# Patient Record
Sex: Female | Born: 1955
Health system: Southern US, Community
[De-identification: ages and names within clinical notes are randomized; demographics above are authoritative.]

## PROBLEM LIST (undated history)

## (undated) DIAGNOSIS — J302 Other seasonal allergic rhinitis: Secondary | ICD-10-CM

## (undated) DIAGNOSIS — G8929 Other chronic pain: Secondary | ICD-10-CM

## (undated) DIAGNOSIS — L589 Radiodermatitis, unspecified: Secondary | ICD-10-CM

## (undated) DIAGNOSIS — K56609 Unspecified intestinal obstruction, unspecified as to partial versus complete obstruction: Secondary | ICD-10-CM

## (undated) DIAGNOSIS — M6281 Muscle weakness (generalized): Secondary | ICD-10-CM

## (undated) DIAGNOSIS — R112 Nausea with vomiting, unspecified: Secondary | ICD-10-CM

## (undated) DIAGNOSIS — M2550 Pain in unspecified joint: Secondary | ICD-10-CM

## (undated) DIAGNOSIS — E559 Vitamin D deficiency, unspecified: Secondary | ICD-10-CM

## (undated) DIAGNOSIS — J3089 Other allergic rhinitis: Secondary | ICD-10-CM

## (undated) DIAGNOSIS — N9089 Other specified noninflammatory disorders of vulva and perineum: Secondary | ICD-10-CM

## (undated) DIAGNOSIS — K589 Irritable bowel syndrome without diarrhea: Secondary | ICD-10-CM

## (undated) DIAGNOSIS — I10 Essential (primary) hypertension: Secondary | ICD-10-CM

## (undated) DIAGNOSIS — N2889 Other specified disorders of kidney and ureter: Secondary | ICD-10-CM

## (undated) DIAGNOSIS — K449 Diaphragmatic hernia without obstruction or gangrene: Secondary | ICD-10-CM

## (undated) DIAGNOSIS — K760 Fatty (change of) liver, not elsewhere classified: Secondary | ICD-10-CM

## (undated) DIAGNOSIS — C549 Malignant neoplasm of corpus uteri, unspecified: Secondary | ICD-10-CM

## (undated) DIAGNOSIS — Z7901 Long term (current) use of anticoagulants: Secondary | ICD-10-CM

## (undated) DIAGNOSIS — N183 Chronic kidney disease, stage 3 unspecified: Secondary | ICD-10-CM

## (undated) DIAGNOSIS — N321 Vesicointestinal fistula: Secondary | ICD-10-CM

## (undated) DIAGNOSIS — D3502 Benign neoplasm of left adrenal gland: Secondary | ICD-10-CM

## (undated) DIAGNOSIS — R002 Palpitations: Secondary | ICD-10-CM

## (undated) DIAGNOSIS — N739 Female pelvic inflammatory disease, unspecified: Secondary | ICD-10-CM

## (undated) DIAGNOSIS — K219 Gastro-esophageal reflux disease without esophagitis: Secondary | ICD-10-CM

## (undated) DIAGNOSIS — R600 Localized edema: Secondary | ICD-10-CM

## (undated) DIAGNOSIS — R300 Dysuria: Secondary | ICD-10-CM

## (undated) DIAGNOSIS — Z9889 Other specified postprocedural states: Secondary | ICD-10-CM

## (undated) DIAGNOSIS — M549 Dorsalgia, unspecified: Secondary | ICD-10-CM

## (undated) DIAGNOSIS — R399 Unspecified symptoms and signs involving the genitourinary system: Secondary | ICD-10-CM

## (undated) DIAGNOSIS — E039 Hypothyroidism, unspecified: Secondary | ICD-10-CM

## (undated) DIAGNOSIS — Z932 Ileostomy status: Secondary | ICD-10-CM

## (undated) DIAGNOSIS — N135 Crossing vessel and stricture of ureter without hydronephrosis: Secondary | ICD-10-CM

## (undated) DIAGNOSIS — D709 Neutropenia, unspecified: Secondary | ICD-10-CM

## (undated) DIAGNOSIS — E113299 Type 2 diabetes mellitus with mild nonproliferative diabetic retinopathy without macular edema, unspecified eye: Secondary | ICD-10-CM

## (undated) DIAGNOSIS — I499 Cardiac arrhythmia, unspecified: Secondary | ICD-10-CM

## (undated) DIAGNOSIS — N182 Chronic kidney disease, stage 2 (mild): Secondary | ICD-10-CM

## (undated) DIAGNOSIS — Z8719 Personal history of other diseases of the digestive system: Secondary | ICD-10-CM

## (undated) DIAGNOSIS — E782 Mixed hyperlipidemia: Secondary | ICD-10-CM

## (undated) DIAGNOSIS — C50912 Malignant neoplasm of unspecified site of left female breast: Secondary | ICD-10-CM

## (undated) DIAGNOSIS — E119 Type 2 diabetes mellitus without complications: Secondary | ICD-10-CM

## (undated) DIAGNOSIS — I480 Paroxysmal atrial fibrillation: Secondary | ICD-10-CM

## (undated) DIAGNOSIS — Z923 Personal history of irradiation: Secondary | ICD-10-CM

## (undated) DIAGNOSIS — Z87898 Personal history of other specified conditions: Secondary | ICD-10-CM

## (undated) DIAGNOSIS — D630 Anemia in neoplastic disease: Secondary | ICD-10-CM

## (undated) DIAGNOSIS — D631 Anemia in chronic kidney disease: Secondary | ICD-10-CM

## (undated) DIAGNOSIS — G629 Polyneuropathy, unspecified: Secondary | ICD-10-CM

## (undated) DIAGNOSIS — Z789 Other specified health status: Secondary | ICD-10-CM

## (undated) DIAGNOSIS — R110 Nausea: Secondary | ICD-10-CM

## (undated) DIAGNOSIS — N189 Chronic kidney disease, unspecified: Secondary | ICD-10-CM

## (undated) DIAGNOSIS — E042 Nontoxic multinodular goiter: Secondary | ICD-10-CM

## (undated) DIAGNOSIS — Z973 Presence of spectacles and contact lenses: Secondary | ICD-10-CM

## (undated) DIAGNOSIS — Z936 Other artificial openings of urinary tract status: Secondary | ICD-10-CM

## (undated) HISTORY — PX: TOTAL ABDOMINAL HYSTERECTOMY: SHX209

## (undated) HISTORY — DX: Gastro-esophageal reflux disease without esophagitis: K21.9

## (undated) HISTORY — DX: Dorsalgia, unspecified: M54.9

## (undated) HISTORY — DX: Pain in unspecified joint: M25.50

## (undated) HISTORY — DX: Anemia in neoplastic disease: D63.0

## (undated) HISTORY — DX: Vesicointestinal fistula: N32.1

## (undated) HISTORY — DX: Other artificial openings of urinary tract status: Z93.6

## (undated) HISTORY — DX: Essential (primary) hypertension: I10

## (undated) HISTORY — DX: Type 2 diabetes mellitus without complications: E11.9

## (undated) HISTORY — DX: Diaphragmatic hernia without obstruction or gangrene: K44.9

## (undated) HISTORY — PX: EYE SURGERY: SHX253

## (undated) HISTORY — DX: Irritable bowel syndrome without diarrhea: K58.9

## (undated) HISTORY — DX: Palpitations: R00.2

## (undated) HISTORY — DX: Localized edema: R60.0

## (undated) HISTORY — DX: Hypothyroidism, unspecified: E03.9

## (undated) HISTORY — PX: TONSILLECTOMY: SUR1361

## (undated) HISTORY — DX: Nontoxic multinodular goiter: E04.2

## (undated) HISTORY — PX: OTHER SURGICAL HISTORY: SHX169

## (undated) HISTORY — PX: APPENDECTOMY: SHX54

---
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
## 1988-08-02 HISTORY — PX: LAPAROSCOPIC CHOLECYSTECTOMY: SUR755

## 1998-07-29 ENCOUNTER — Ambulatory Visit (HOSPITAL_COMMUNITY): Admission: RE | Admit: 1998-07-29 | Discharge: 1998-07-29 | Payer: Self-pay | Admitting: Obstetrics and Gynecology

## 1998-07-29 ENCOUNTER — Encounter: Payer: Self-pay | Admitting: Obstetrics and Gynecology

## 1999-07-31 ENCOUNTER — Ambulatory Visit (HOSPITAL_COMMUNITY): Admission: RE | Admit: 1999-07-31 | Discharge: 1999-07-31 | Payer: Self-pay | Admitting: Obstetrics and Gynecology

## 1999-07-31 ENCOUNTER — Encounter: Payer: Self-pay | Admitting: Obstetrics and Gynecology

## 1999-08-06 ENCOUNTER — Encounter: Payer: Self-pay | Admitting: Obstetrics and Gynecology

## 1999-08-06 ENCOUNTER — Ambulatory Visit (HOSPITAL_COMMUNITY): Admission: RE | Admit: 1999-08-06 | Discharge: 1999-08-06 | Payer: Self-pay | Admitting: Obstetrics and Gynecology

## 1999-09-02 ENCOUNTER — Ambulatory Visit (HOSPITAL_COMMUNITY): Admission: RE | Admit: 1999-09-02 | Discharge: 1999-09-02 | Payer: Self-pay | Admitting: Obstetrics and Gynecology

## 1999-09-02 ENCOUNTER — Encounter: Payer: Self-pay | Admitting: Obstetrics and Gynecology

## 1999-09-08 ENCOUNTER — Encounter (INDEPENDENT_AMBULATORY_CARE_PROVIDER_SITE_OTHER): Payer: Self-pay | Admitting: Specialist

## 1999-09-08 ENCOUNTER — Other Ambulatory Visit: Admission: RE | Admit: 1999-09-08 | Discharge: 1999-09-08 | Payer: Self-pay | Admitting: Obstetrics and Gynecology

## 2000-02-09 ENCOUNTER — Encounter: Admission: RE | Admit: 2000-02-09 | Discharge: 2000-02-09 | Payer: Self-pay | Admitting: Obstetrics and Gynecology

## 2000-02-09 ENCOUNTER — Encounter: Payer: Self-pay | Admitting: Obstetrics and Gynecology

## 2000-08-01 ENCOUNTER — Encounter: Payer: Self-pay | Admitting: Obstetrics and Gynecology

## 2000-08-01 ENCOUNTER — Ambulatory Visit (HOSPITAL_COMMUNITY): Admission: RE | Admit: 2000-08-01 | Discharge: 2000-08-01 | Payer: Self-pay | Admitting: Obstetrics and Gynecology

## 2002-08-02 ENCOUNTER — Encounter (INDEPENDENT_AMBULATORY_CARE_PROVIDER_SITE_OTHER): Payer: Self-pay | Admitting: *Deleted

## 2002-08-29 ENCOUNTER — Encounter: Payer: Self-pay | Admitting: Gastroenterology

## 2002-08-29 ENCOUNTER — Encounter: Admission: RE | Admit: 2002-08-29 | Discharge: 2002-08-29 | Payer: Self-pay | Admitting: Gastroenterology

## 2002-09-03 ENCOUNTER — Encounter: Payer: Self-pay | Admitting: Obstetrics and Gynecology

## 2002-09-03 ENCOUNTER — Encounter: Admission: RE | Admit: 2002-09-03 | Discharge: 2002-09-03 | Payer: Self-pay | Admitting: Obstetrics and Gynecology

## 2002-09-12 ENCOUNTER — Encounter: Admission: RE | Admit: 2002-09-12 | Discharge: 2002-09-12 | Payer: Self-pay | Admitting: Family Medicine

## 2002-09-13 ENCOUNTER — Ambulatory Visit (HOSPITAL_COMMUNITY): Admission: RE | Admit: 2002-09-13 | Discharge: 2002-09-13 | Payer: Self-pay | Admitting: Gastroenterology

## 2002-11-21 ENCOUNTER — Encounter: Admission: RE | Admit: 2002-11-21 | Discharge: 2002-11-21 | Payer: Self-pay | Admitting: Family Medicine

## 2002-12-19 ENCOUNTER — Encounter: Admission: RE | Admit: 2002-12-19 | Discharge: 2002-12-19 | Payer: Self-pay | Admitting: Family Medicine

## 2003-11-01 ENCOUNTER — Encounter: Admission: RE | Admit: 2003-11-01 | Discharge: 2003-11-01 | Payer: Self-pay | Admitting: Obstetrics and Gynecology

## 2004-09-30 DIAGNOSIS — C549 Malignant neoplasm of corpus uteri, unspecified: Secondary | ICD-10-CM

## 2004-09-30 HISTORY — DX: Malignant neoplasm of corpus uteri, unspecified: C54.9

## 2004-10-27 ENCOUNTER — Ambulatory Visit (HOSPITAL_COMMUNITY): Admission: RE | Admit: 2004-10-27 | Discharge: 2004-10-27 | Payer: Self-pay | Admitting: Family Medicine

## 2004-10-28 ENCOUNTER — Observation Stay (HOSPITAL_COMMUNITY): Admission: EM | Admit: 2004-10-28 | Discharge: 2004-10-28 | Payer: Self-pay | Admitting: Emergency Medicine

## 2004-12-07 ENCOUNTER — Encounter: Admission: RE | Admit: 2004-12-07 | Discharge: 2004-12-07 | Payer: Self-pay | Admitting: Obstetrics and Gynecology

## 2005-06-07 ENCOUNTER — Ambulatory Visit (HOSPITAL_COMMUNITY): Admission: RE | Admit: 2005-06-07 | Discharge: 2005-06-07 | Payer: Self-pay | Admitting: Gastroenterology

## 2005-10-18 IMAGING — CT CT ABDOMEN W/ CM
1 of 2 series · 13 of 32 positions shown, 19 images · IV contrast (RECTAL)
Comparison: none

CLINICAL DATA: Right lower quadrant pain.
TECHNIQUE: 100 cc Omnipaque 300.  After the initial scan, repeat images through the pelvis were obtained after administering rectal contrast.
 CT ABDOMEN WITH CONTRAST - 10/27/04 AT 4922 HOURS:

[Series 2: routine abdomen · axial · 0.70mm/px · z∈[-441,-121]mm · 13 of 74 slices shown, 19 images]
[im 5/74  soft-tissue]
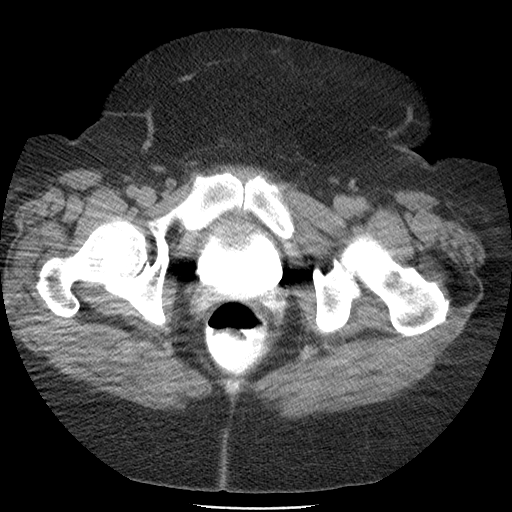
[im 5/74  bone]
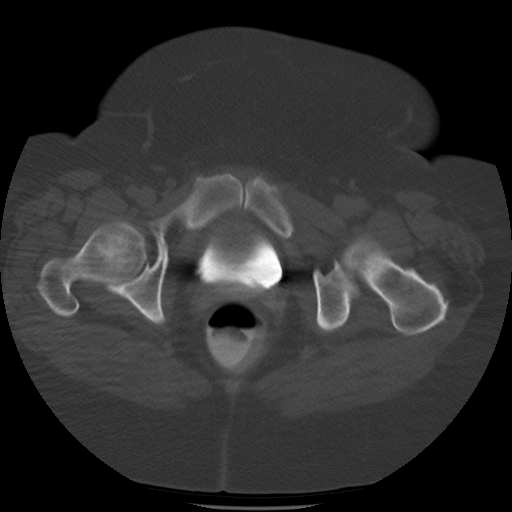
[im 10/74  soft-tissue]
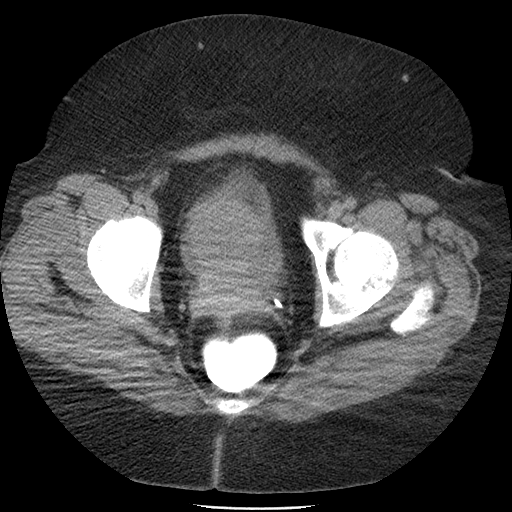
[im 15/74  soft-tissue]
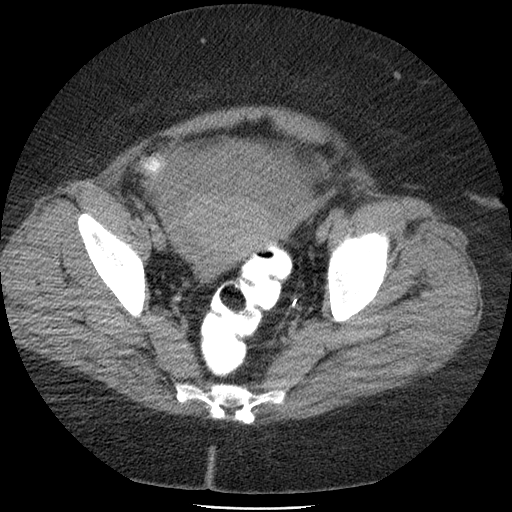
[im 20/74  soft-tissue]
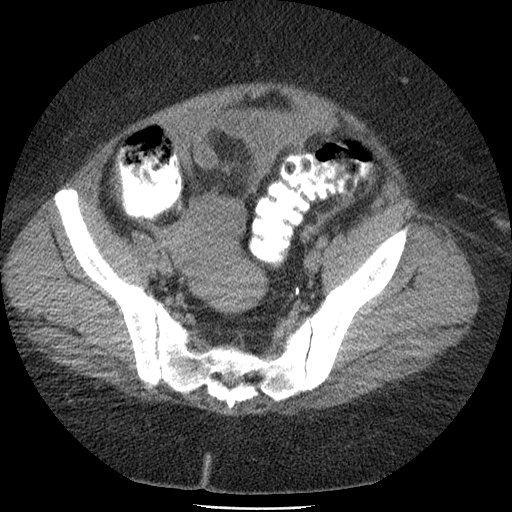
[im 25/74  soft-tissue]
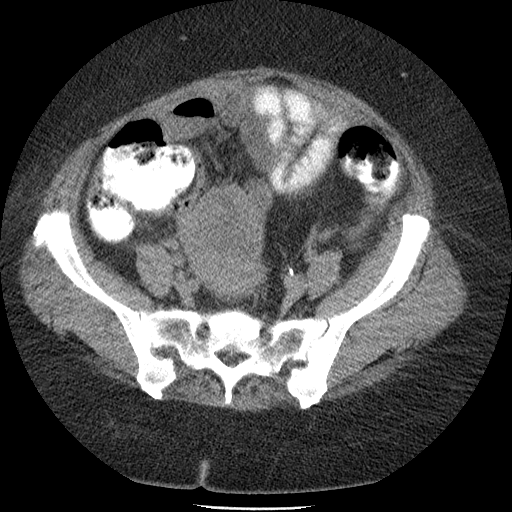
[im 30/74  soft-tissue]
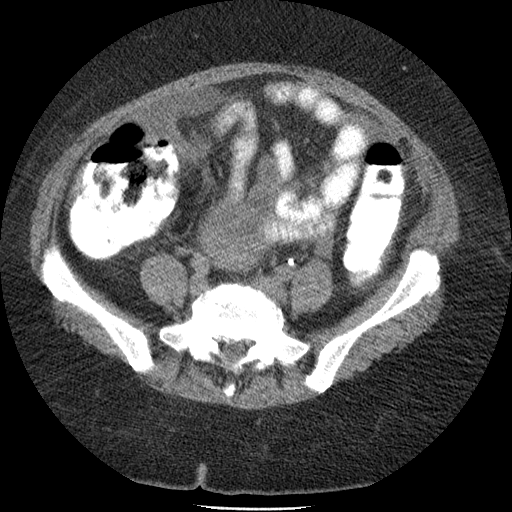
[im 39/74  soft-tissue]
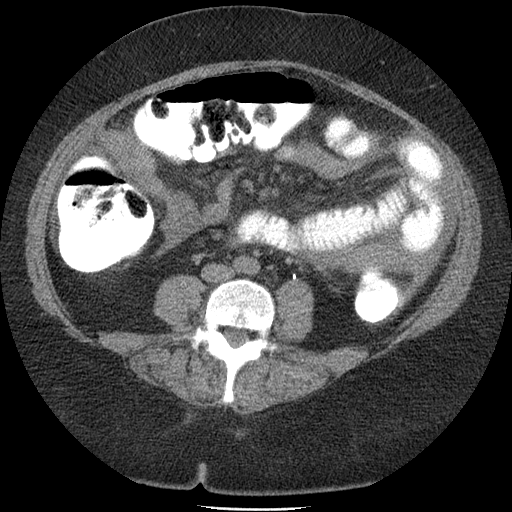
[im 44/74  soft-tissue]
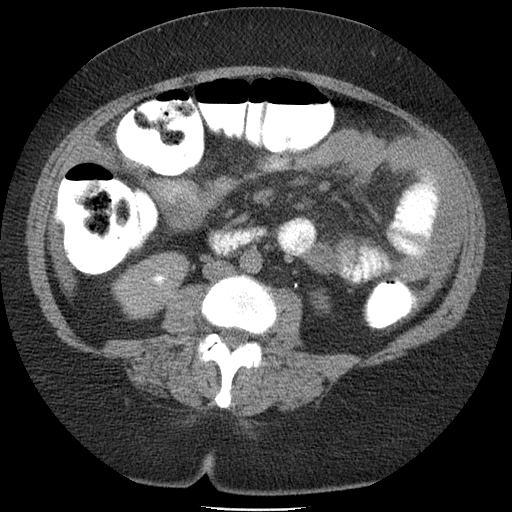
[im 49/74  soft-tissue]
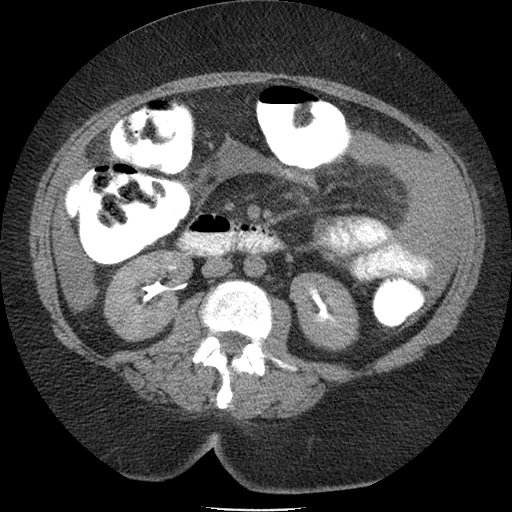
[im 49/74  bone]
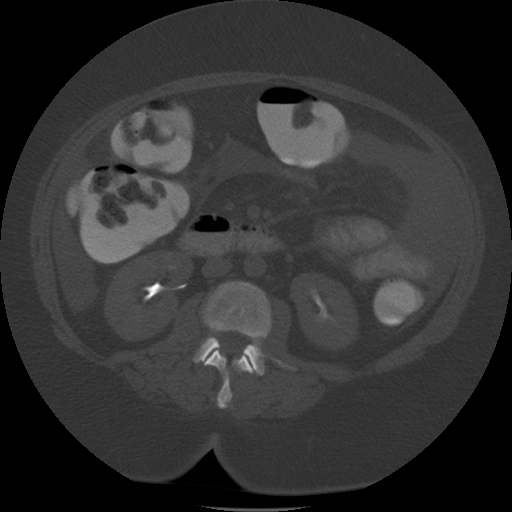
[im 54/74  soft-tissue]
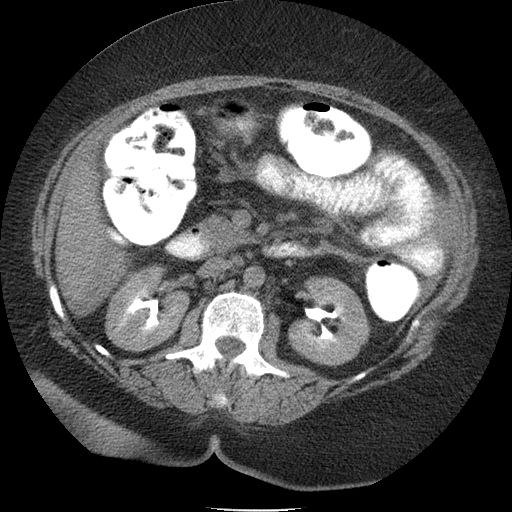
[im 54/74  lung]
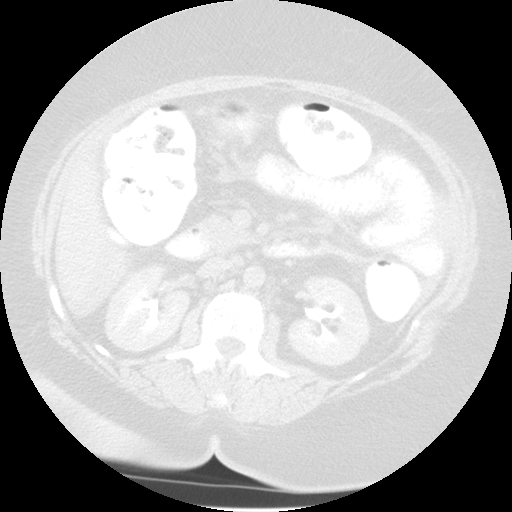
[im 59/74  soft-tissue]
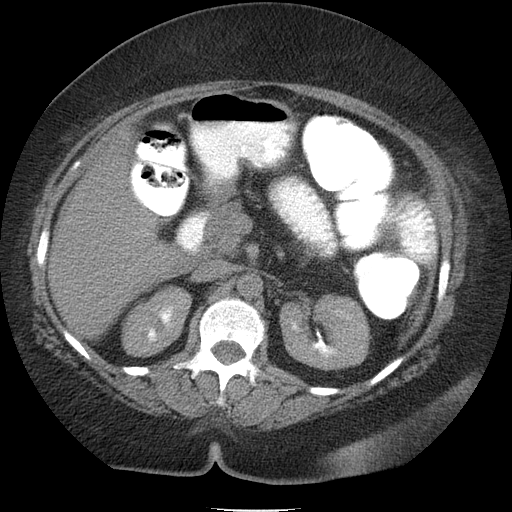
[im 59/74  lung]
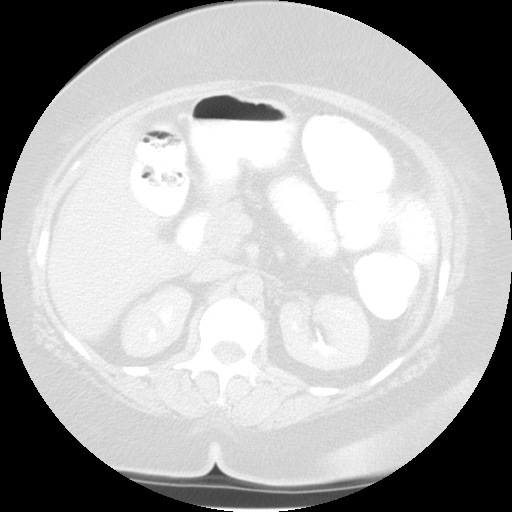
[im 64/74  soft-tissue]
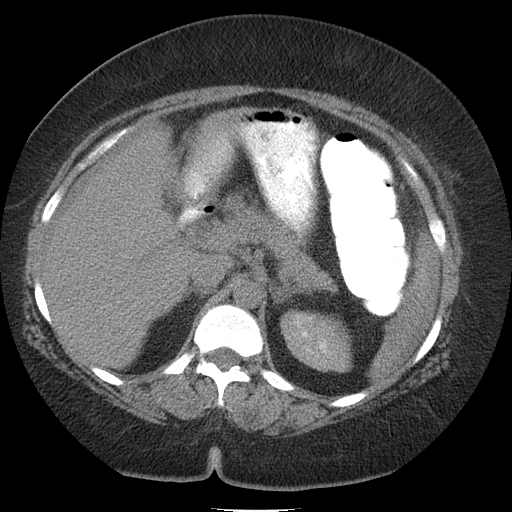
[im 64/74  lung]
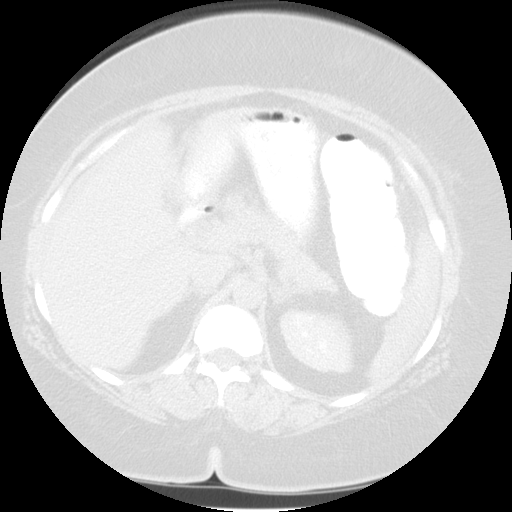
[im 69/74  soft-tissue]
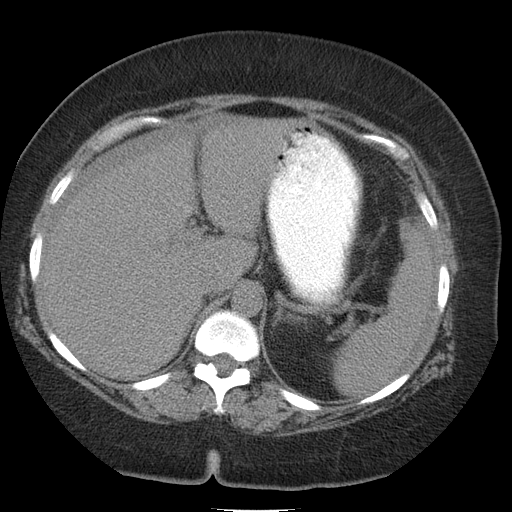
[im 69/74  lung]
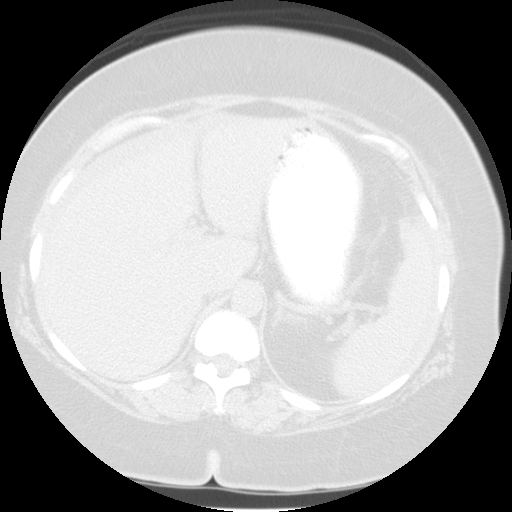

[13 of 32 positions shown; findings below may reference images not displayed]

FINDINGS: A small amount of ascites is present.  The gallbladder is surgically absent.  The liver, spleen, kidneys, adrenal glands, and pancreas are within normal limits.  The small bowel is decompressed.  Stool is seen throughout the colon.  There is no evidence of omental caking.  No subcapsular liver deposits are seen.
IMPRESSION: Ascites.  See pelvis for details. 
 CT PELVIS WITH CONTRAST - 10/27/04 AT 4922 HOURS:
FINDINGS: Thin sections were also obtained.  The appendix is contrast filled and very short.  See images 59-61, series 102.  There is, however, a large mass extending from the right adnexa into the upper pelvis.  It demonstrates heterogeneous enhancement and is 5.9 x 9.6 cm.  It does appear separate from the uterus.  The left ovary is seen on image 71 and is within normal limits.  Free fluid is noted.  The uterus is within normal limits.  The bladder is within normal limits and decompressed.  Negative abnormal adenopathy.
IMPRESSION: 1.  No evidence of appendicitis. 
 2.  Large right adnexal mass, worrisome for ovarian carcinoma.  Ascites would raise suspicion of peritoneal metastatic disease.

## 2005-10-18 IMAGING — US US TRANSVAGINAL NON-OB
1 series · 14 of 25 positions shown · non-contrast
Comparison: none

CLINICAL DATA: Pelvic mass seen by CT.
 TRANSABDOMINAL AND TRANSVAGINAL PELVIC ULTRASOUND - 10/27/04:
 ULTRASOUND DOPPLER OF PELVIS - 10/27/04:

[Series 1: unknown · 0.35mm/px · 14 of 64 slices shown]
[im 1/64]
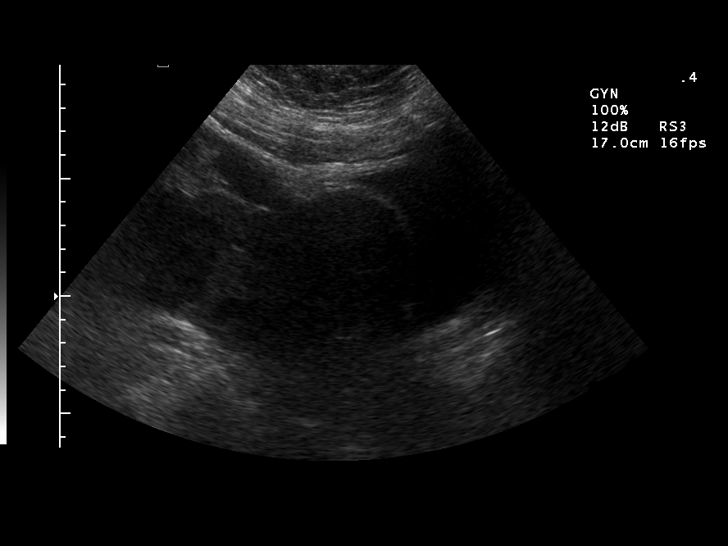
[im 6/64]
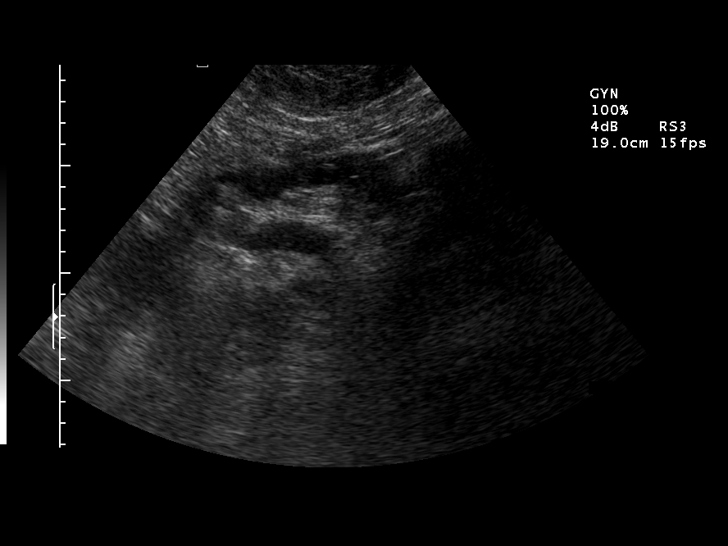
[im 11/64]
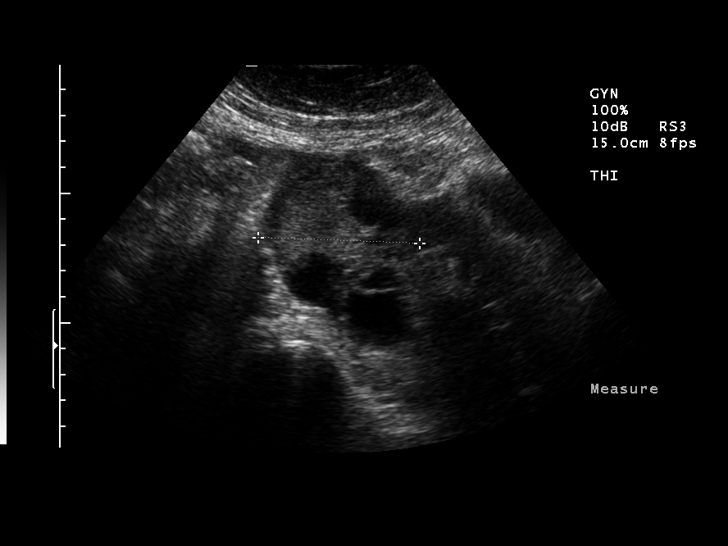
[im 16/64]
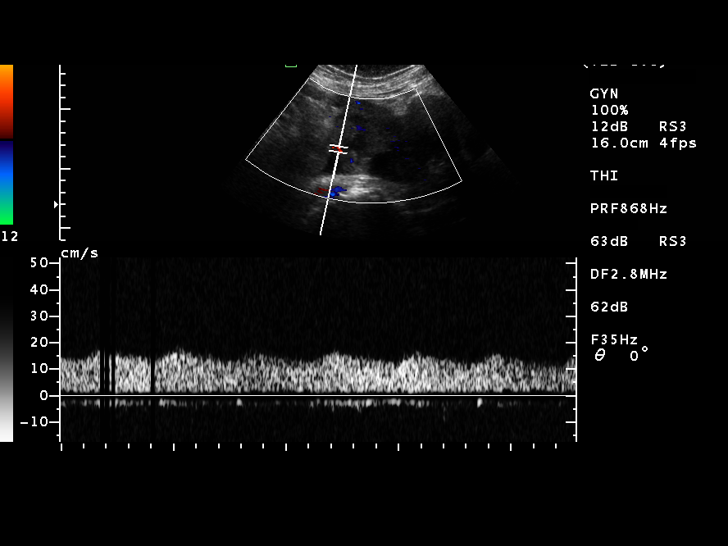
[im 22/64]
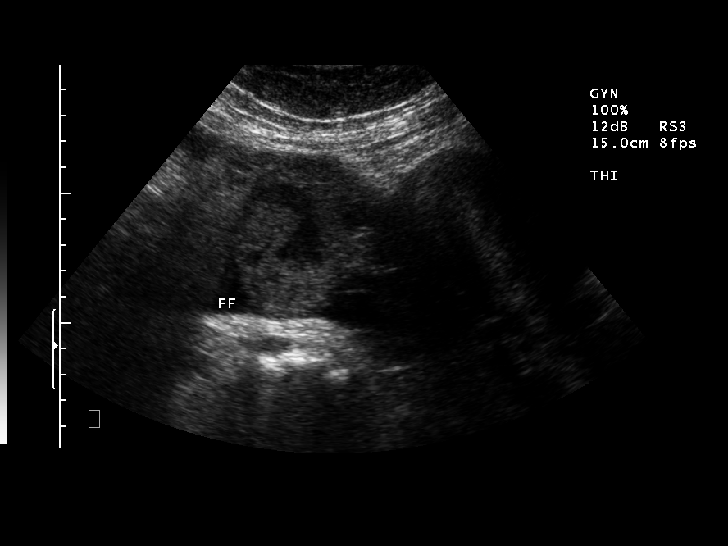
[im 24/64]
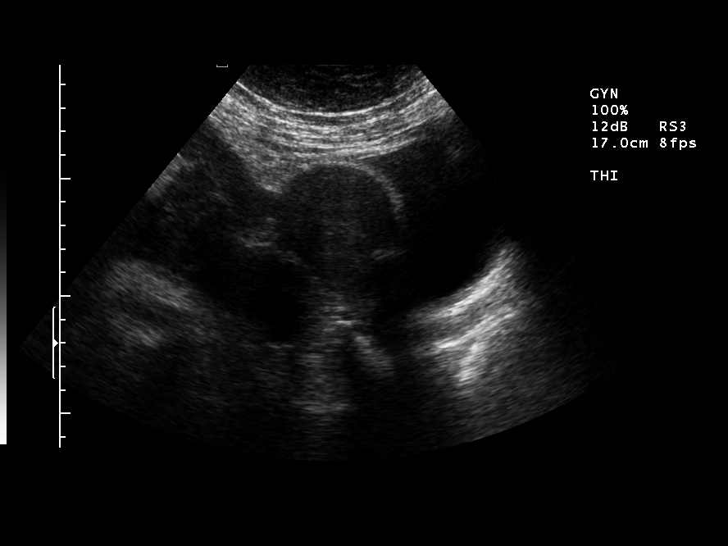
[im 29/64]
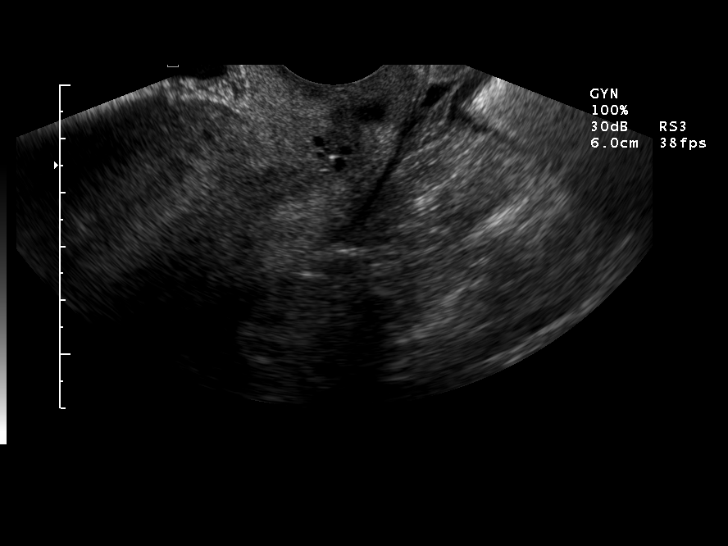
[im 35/64]
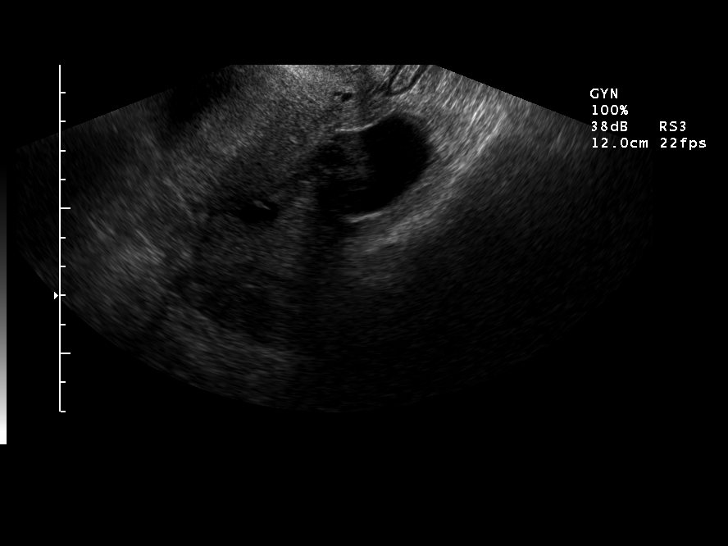
[im 40/64]
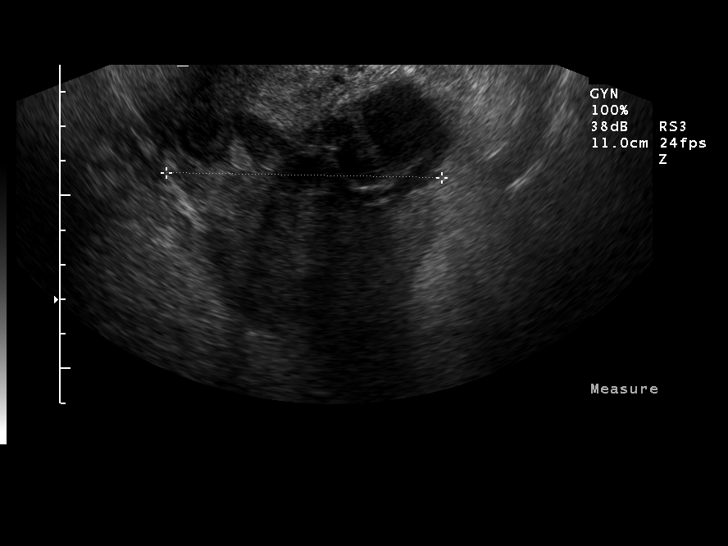
[im 43/64]
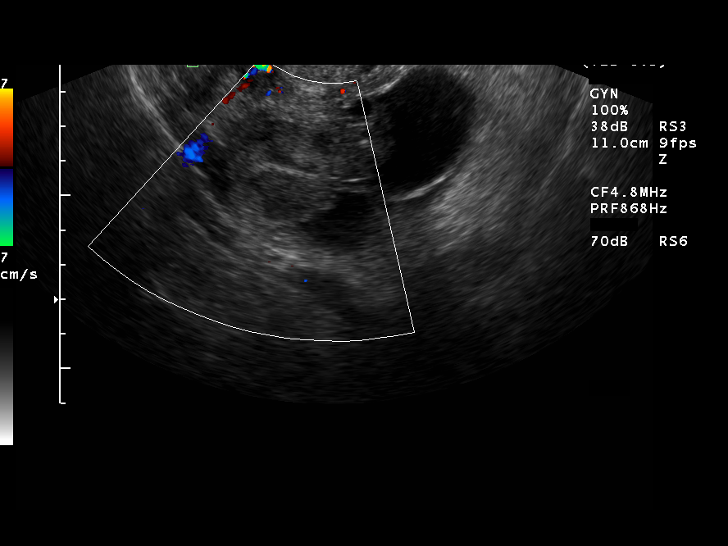
[im 48/64]
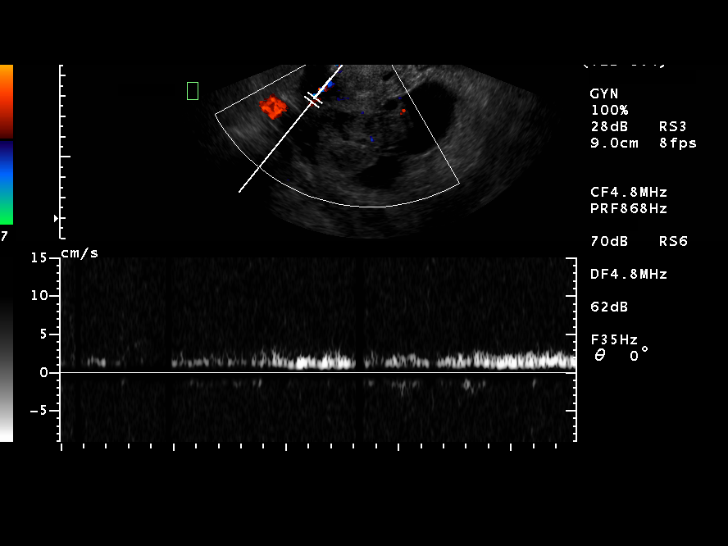
[im 53/64]
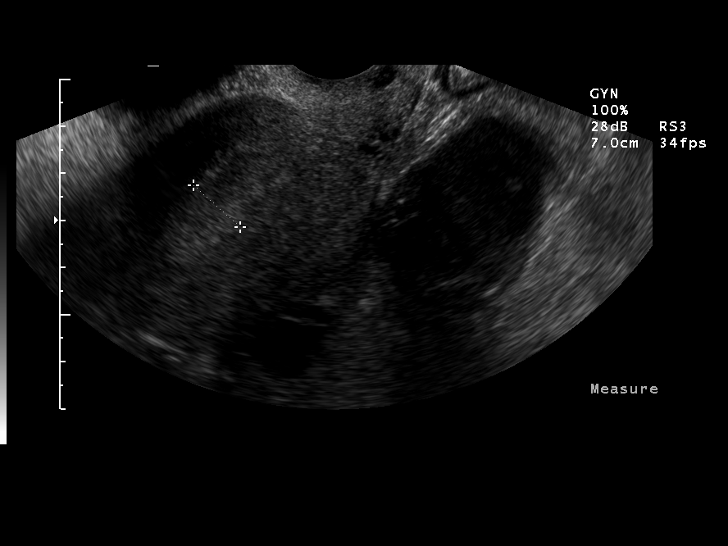
[im 58/64]
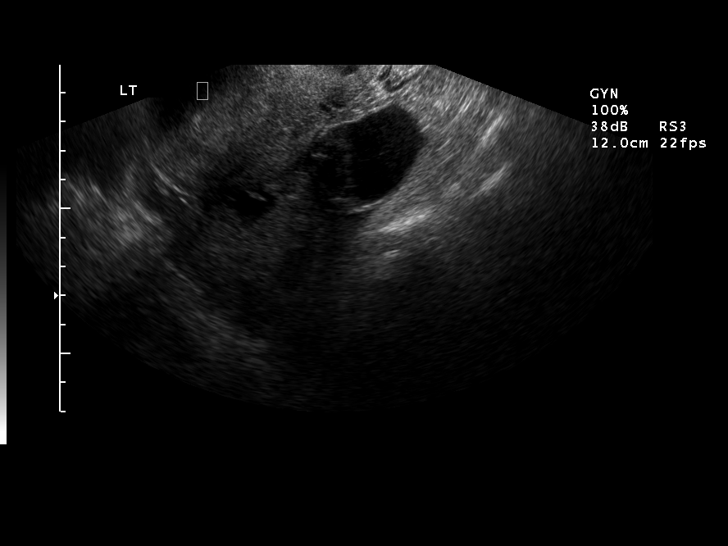
[im 64/64]
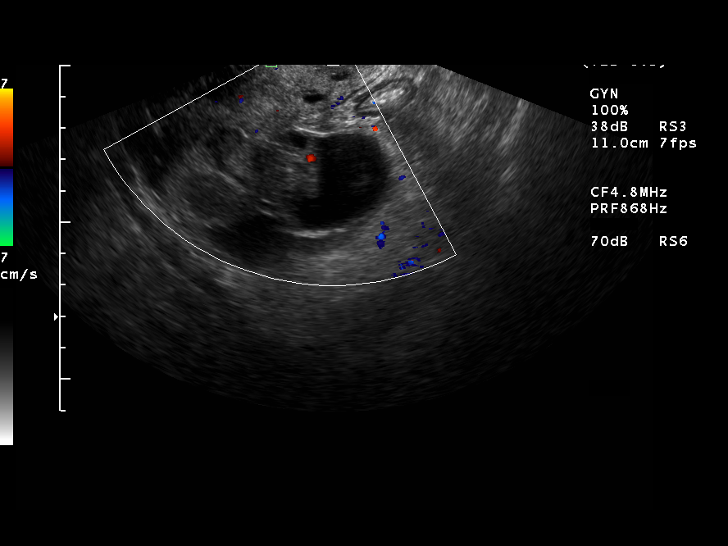

[14 of 25 positions shown; findings below may reference images not displayed]

FINDINGS: There is a central pelvic mass identified, which measures 8.0 x 8.5 x 5.4 cm.  This is a complex solid and cystic mass.  Very difficult to separate either the right or left ovary from this mass.  There is peripheral blood flow noted within this mass.  Findings concerning for possible ovarian tumor.  Difficult to evaluate ovaries for torsion due to their intimate relationship with this complex solid and cystic mass.  There is a small amount of adjacent free fluid.
 Uterus measures 9.0 x 4.5 x 5.1 cm.  Endometrial stripe is 13 mm.
IMPRESSION: 1.   Complex solid and cystic mass within the midline of the pelvis, intimately associated with both ovaries, concerning for ovarian tumor.  Difficult to separate either ovary.  There appears to be blood flow within both ovaries, although this is somewhat difficult to evaluate separate from the pelvic mass. 
 2.  Small amount of free fluid.

## 2005-10-25 ENCOUNTER — Encounter: Admission: RE | Admit: 2005-10-25 | Discharge: 2005-10-25 | Payer: Self-pay | Admitting: Rheumatology

## 2005-12-16 ENCOUNTER — Encounter: Admission: RE | Admit: 2005-12-16 | Discharge: 2005-12-16 | Payer: Self-pay | Admitting: Obstetrics & Gynecology

## 2006-04-20 ENCOUNTER — Ambulatory Visit (HOSPITAL_COMMUNITY): Admission: RE | Admit: 2006-04-20 | Discharge: 2006-04-20 | Payer: Self-pay | Admitting: Endocrinology

## 2006-05-20 ENCOUNTER — Ambulatory Visit (HOSPITAL_COMMUNITY): Admission: RE | Admit: 2006-05-20 | Discharge: 2006-05-20 | Payer: Self-pay | Admitting: Endocrinology

## 2006-05-20 ENCOUNTER — Encounter (INDEPENDENT_AMBULATORY_CARE_PROVIDER_SITE_OTHER): Payer: Self-pay | Admitting: Specialist

## 2006-09-30 ENCOUNTER — Encounter (INDEPENDENT_AMBULATORY_CARE_PROVIDER_SITE_OTHER): Payer: Self-pay | Admitting: *Deleted

## 2006-10-16 IMAGING — CR DG LUMBAR SPINE 2-3V
3 series · 3 of 3 positions shown · non-contrast
Comparison: none

CLINICAL DATA: Back pain. 
 LUMBAR SPINE ? 2 VIEW:

[t l-spine a.p.]
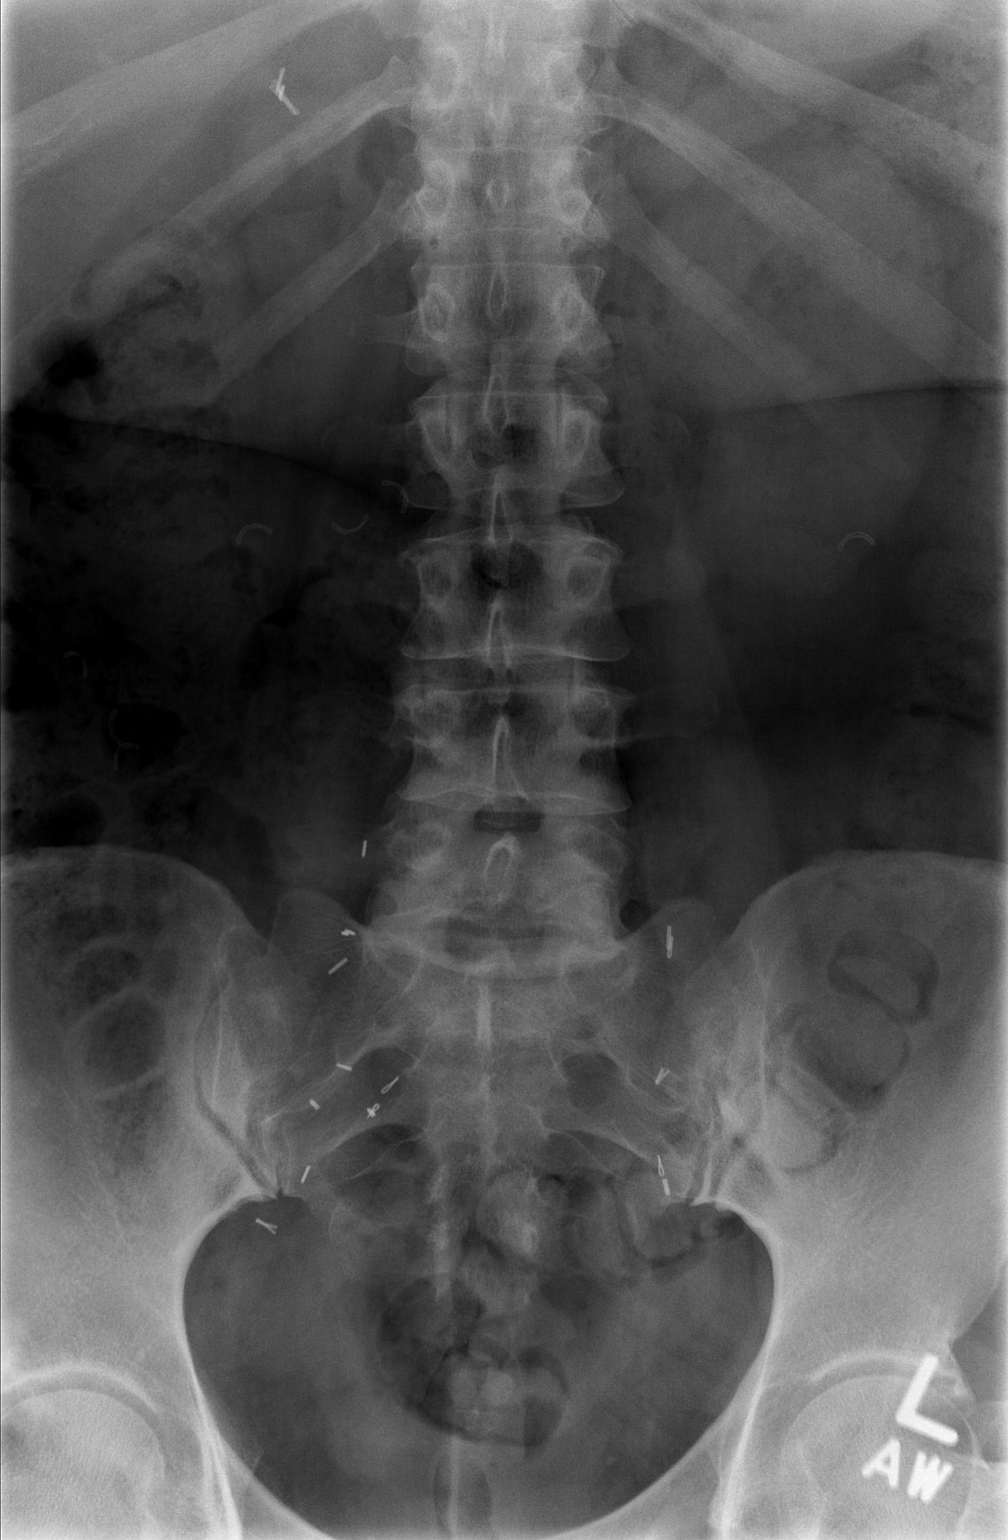

[t l-spine lat]
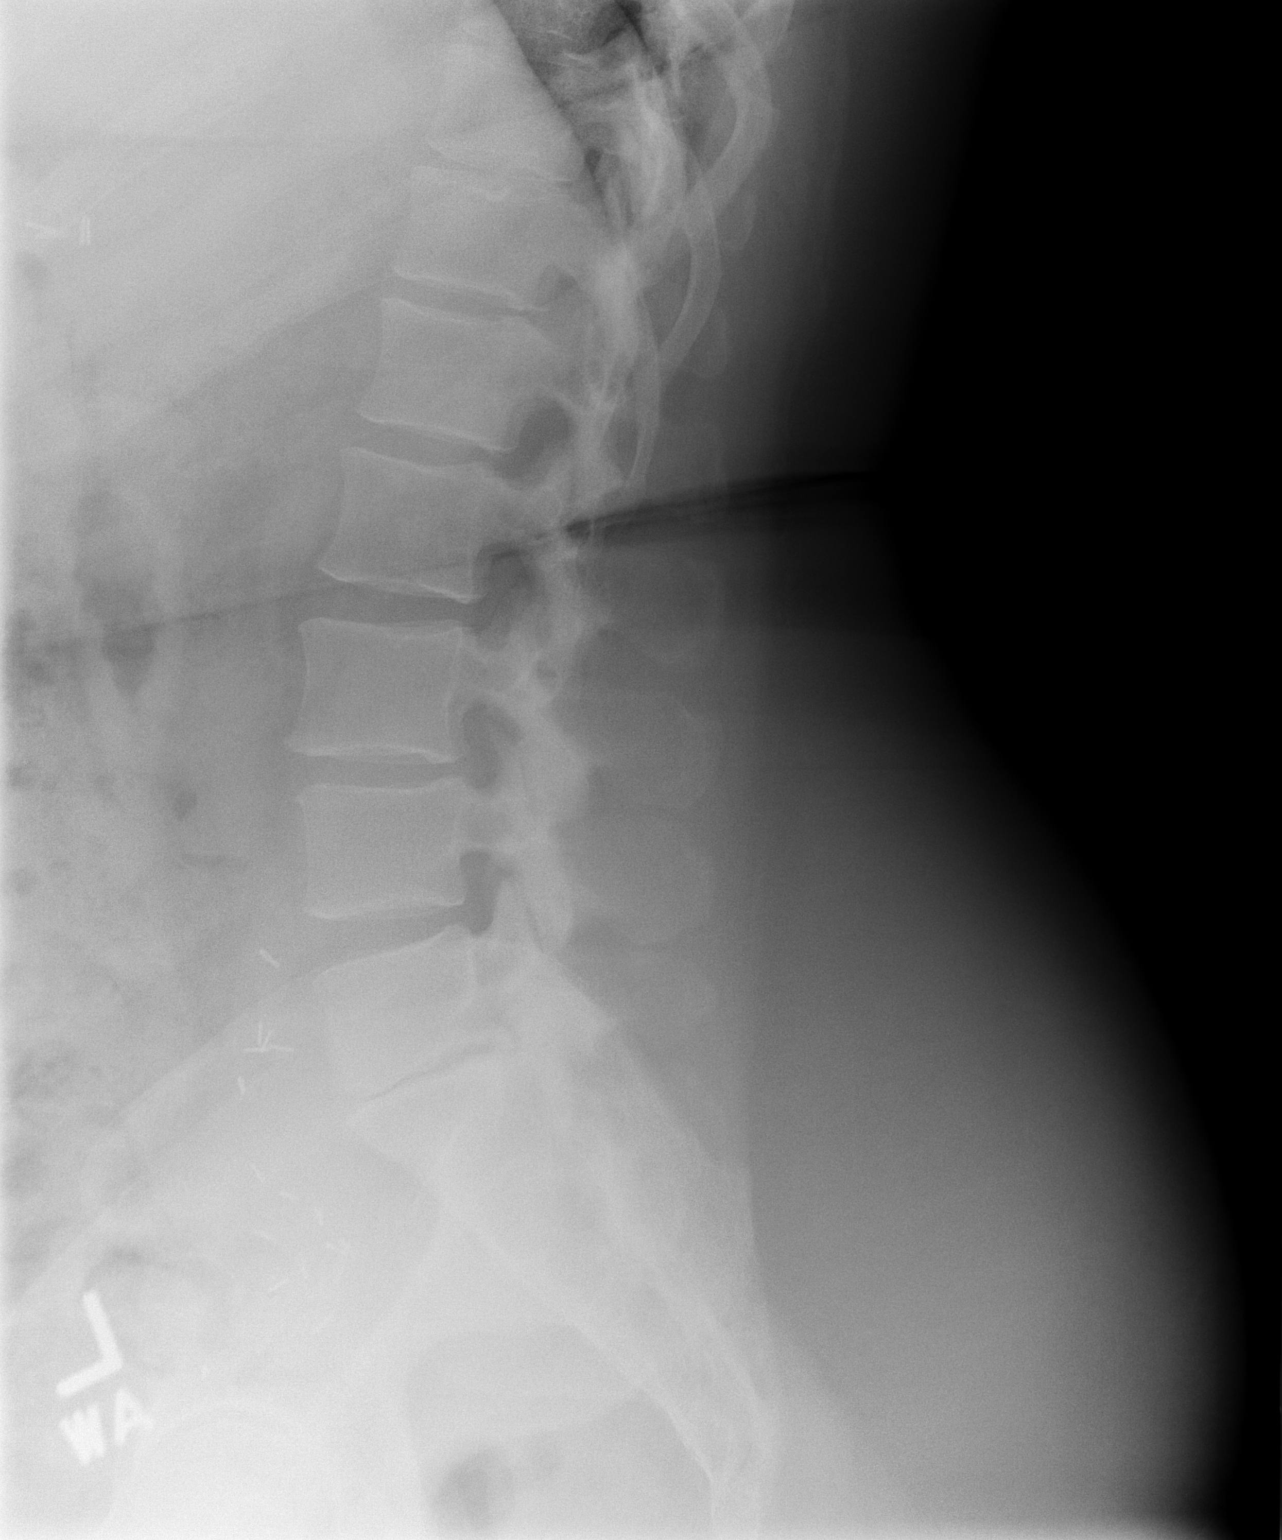

[t l-spine l5-s1 spot]
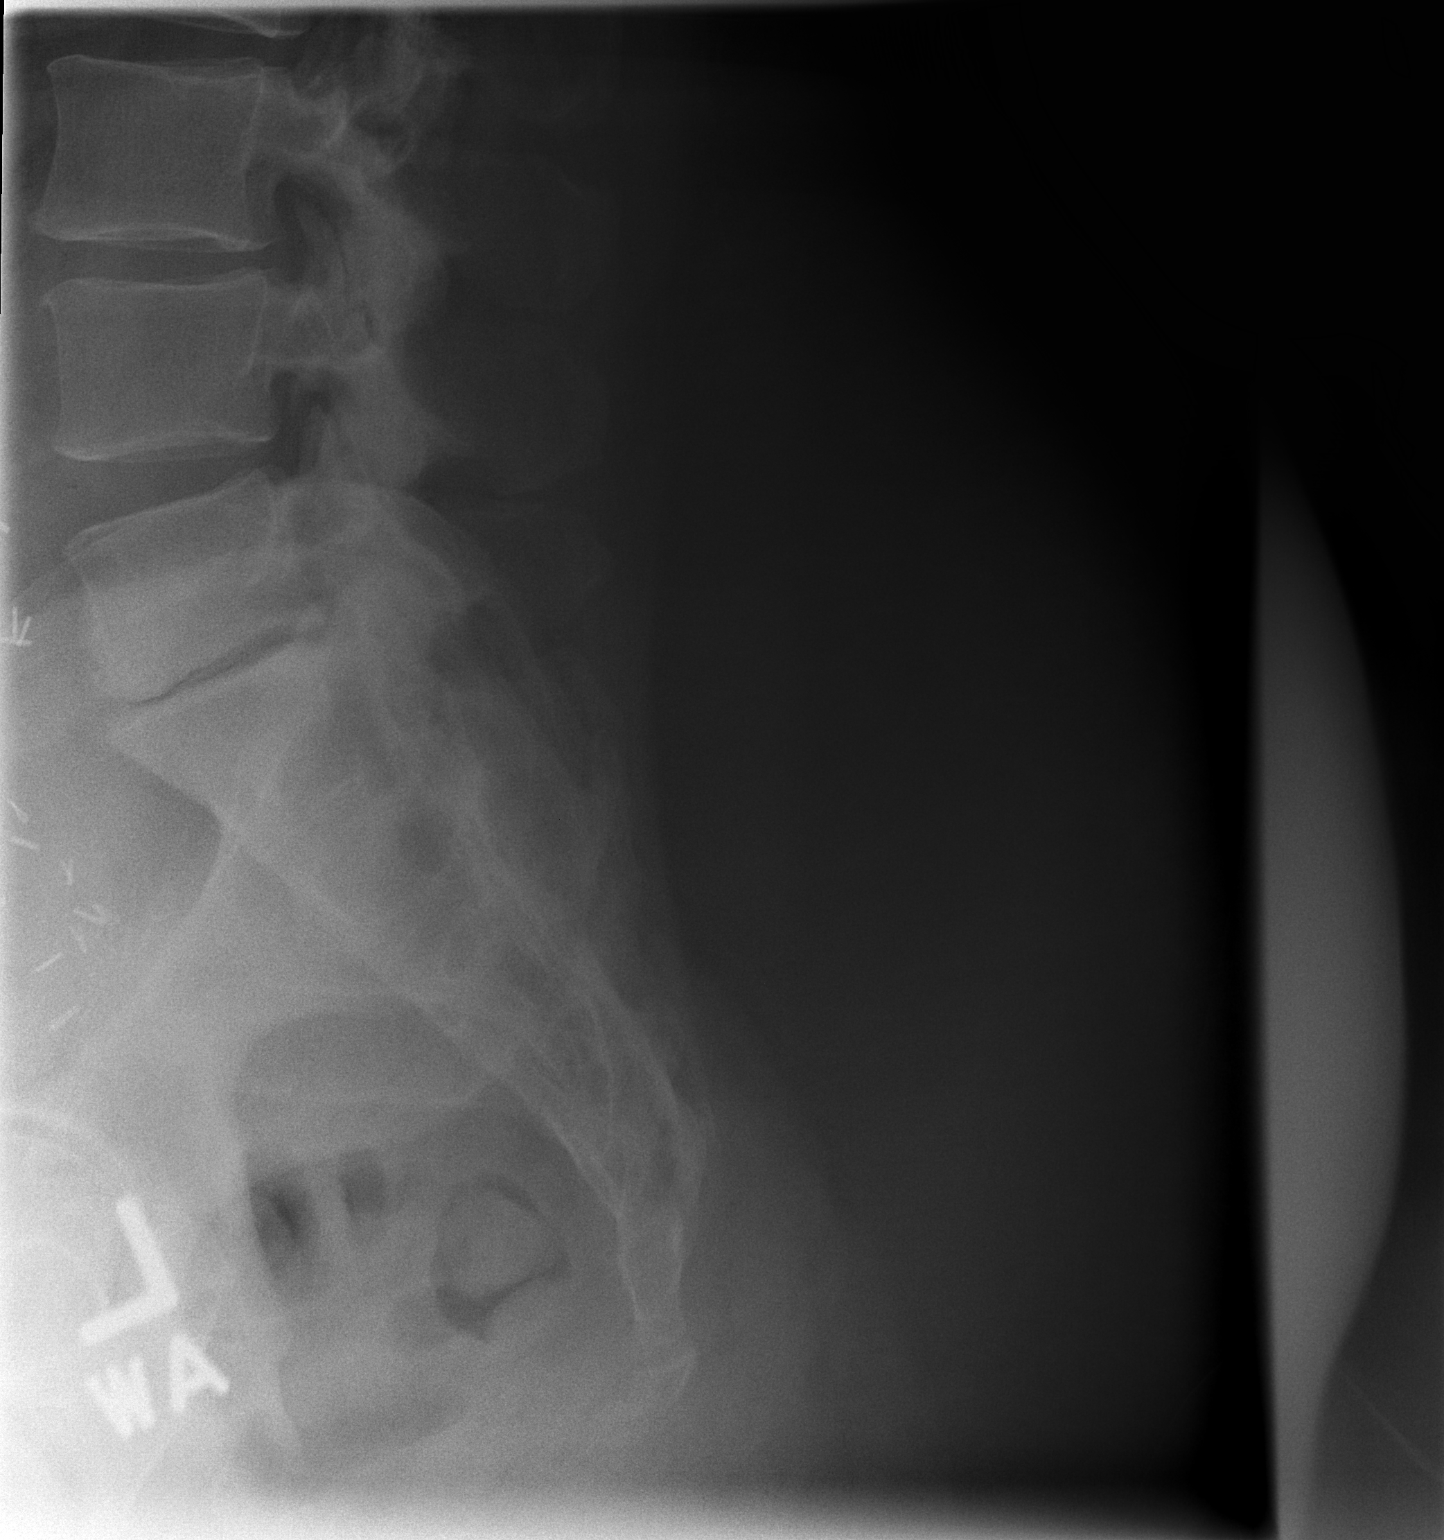

[3 of 3 positions shown; findings below may reference images not displayed]

FINDINGS: There are five lumbar type vertebra present.  Degenerative disk space narrowing is noted at the L3-4 and L5-S1 levels.  There are no subluxations or destructive changes.
IMPRESSION: L3-4 and L5-S1 degenerative disk space narrowing.  Otherwise normal study.

## 2006-11-02 ENCOUNTER — Encounter: Admission: RE | Admit: 2006-11-02 | Discharge: 2006-11-02 | Payer: Self-pay | Admitting: Surgery

## 2006-12-08 ENCOUNTER — Encounter (INDEPENDENT_AMBULATORY_CARE_PROVIDER_SITE_OTHER): Payer: Self-pay | Admitting: Specialist

## 2006-12-08 ENCOUNTER — Ambulatory Visit (HOSPITAL_BASED_OUTPATIENT_CLINIC_OR_DEPARTMENT_OTHER): Admission: RE | Admit: 2006-12-08 | Discharge: 2006-12-08 | Payer: Self-pay | Admitting: Surgery

## 2006-12-08 HISTORY — PX: OTHER SURGICAL HISTORY: SHX169

## 2007-03-13 ENCOUNTER — Encounter: Admission: RE | Admit: 2007-03-13 | Discharge: 2007-03-13 | Payer: Self-pay | Admitting: Obstetrics & Gynecology

## 2007-04-13 ENCOUNTER — Ambulatory Visit (HOSPITAL_COMMUNITY): Admission: RE | Admit: 2007-04-13 | Discharge: 2007-04-13 | Payer: Self-pay | Admitting: Gastroenterology

## 2007-04-13 ENCOUNTER — Encounter (INDEPENDENT_AMBULATORY_CARE_PROVIDER_SITE_OTHER): Payer: Self-pay | Admitting: Gastroenterology

## 2007-05-11 IMAGING — US US BIOPSY
1 series · 13 of 13 positions shown · non-contrast
Comparison: none

CLINICAL DATA: Left thyroid biopsy. 
 ULTRASOUND-GUIDED THYROID BIOPSY:

[Series 1: unknown · 0.09mm/px · 13 of 13 slices shown]
[im 1/13]
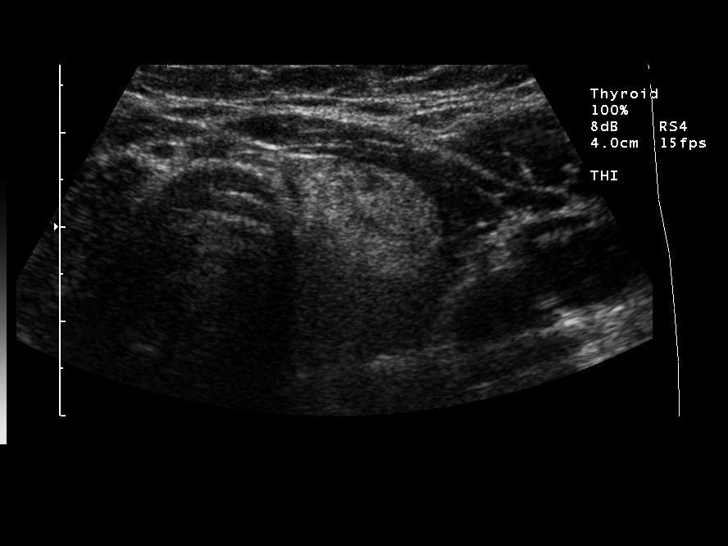
[im 2/13]
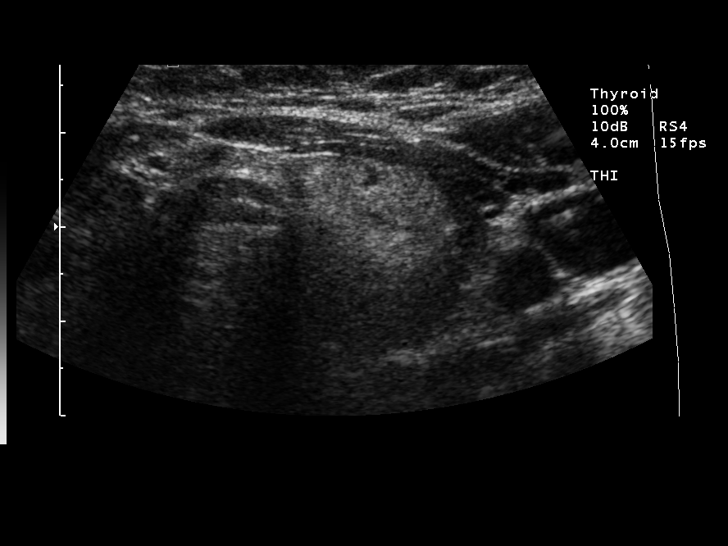
[im 3/13]
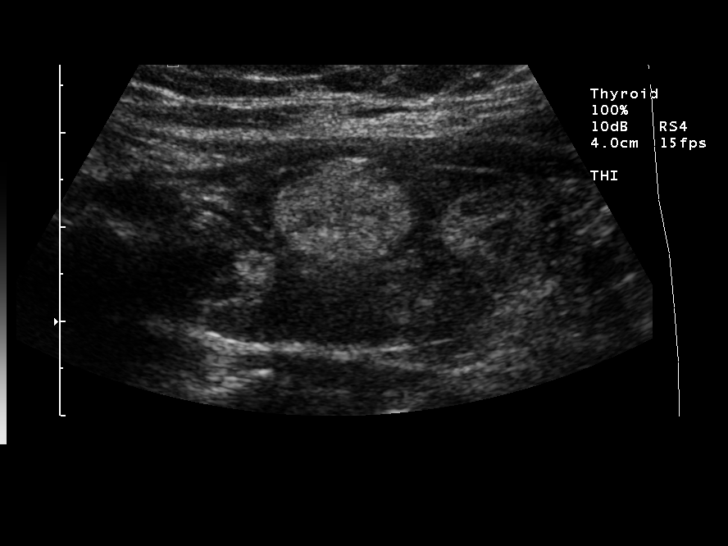
[im 4/13]
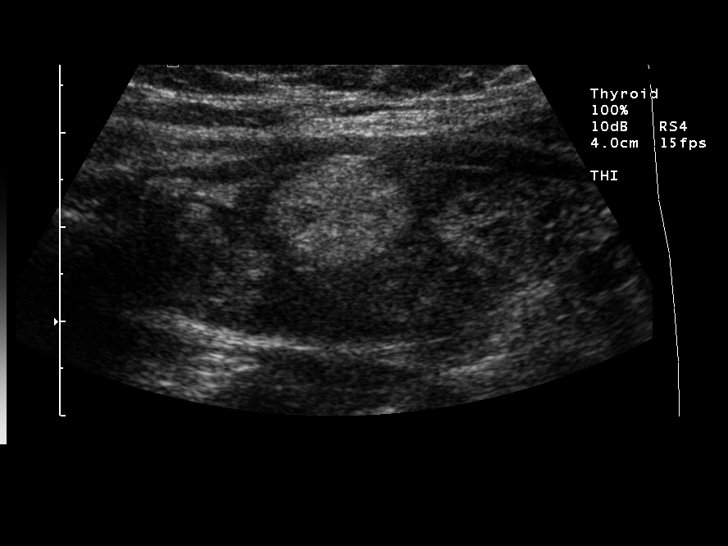
[im 5/13]
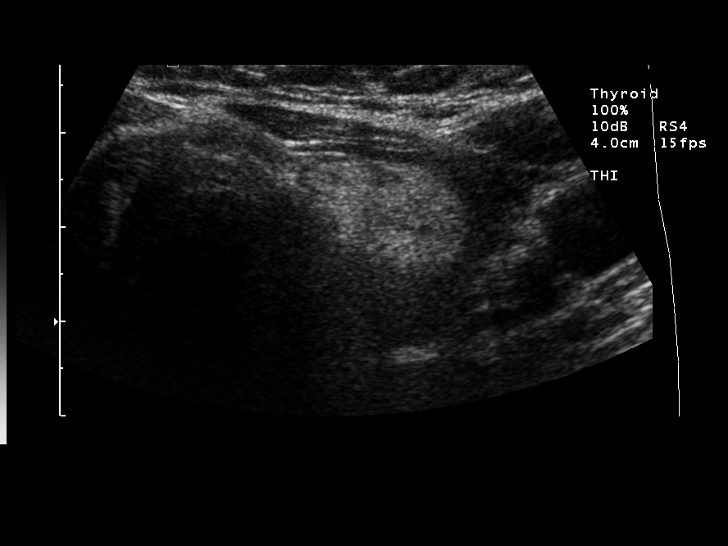
[im 6/13]
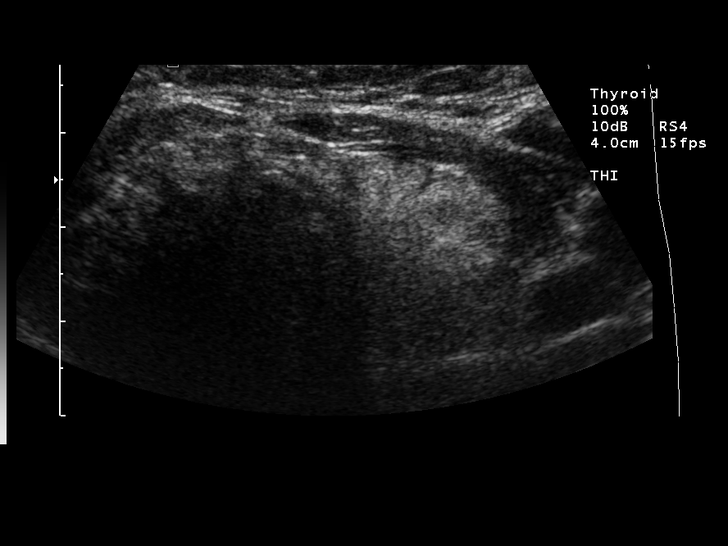
[im 7/13]
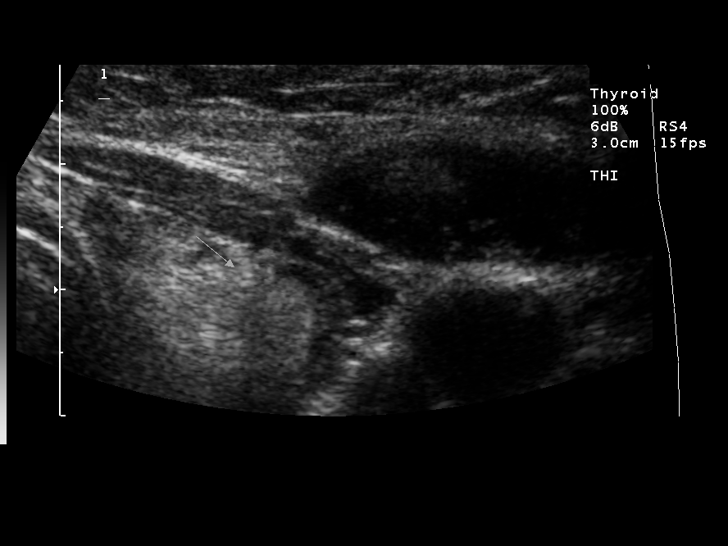
[im 8/13]
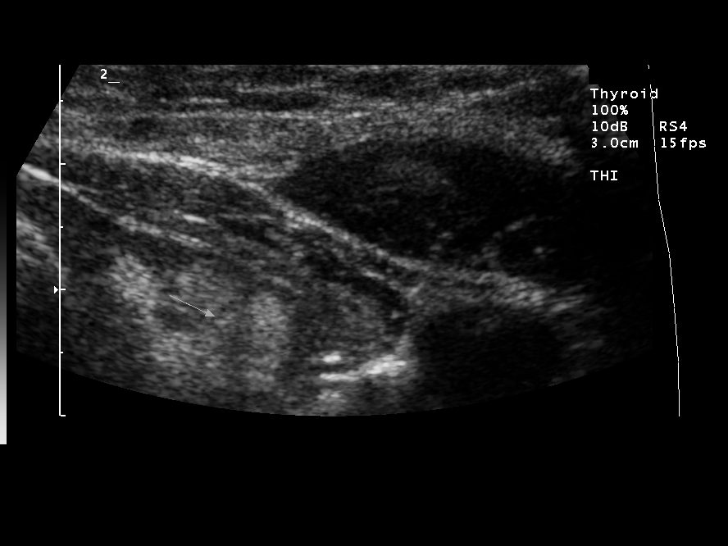
[im 9/13]
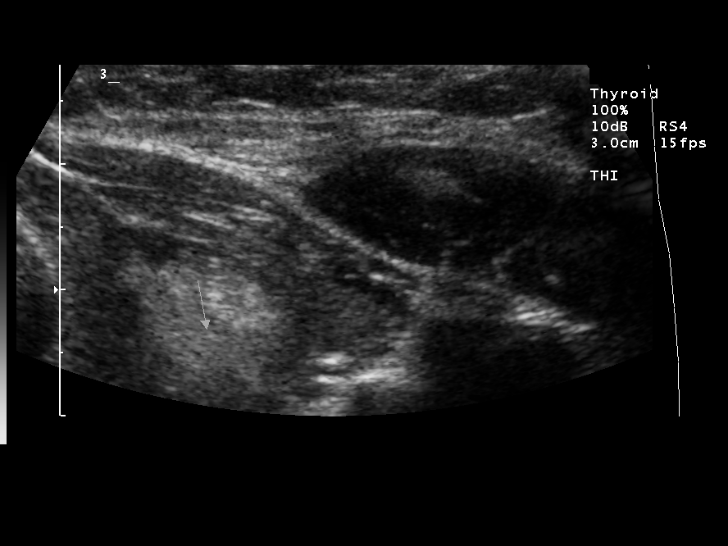
[im 10/13]
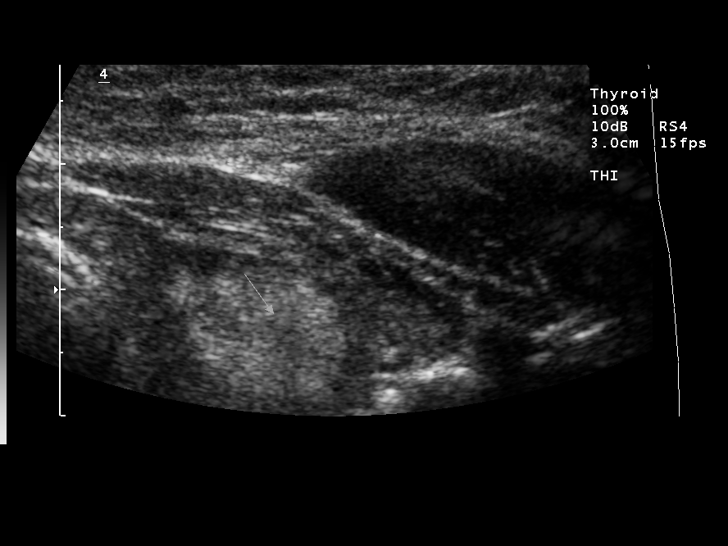
[im 11/13]
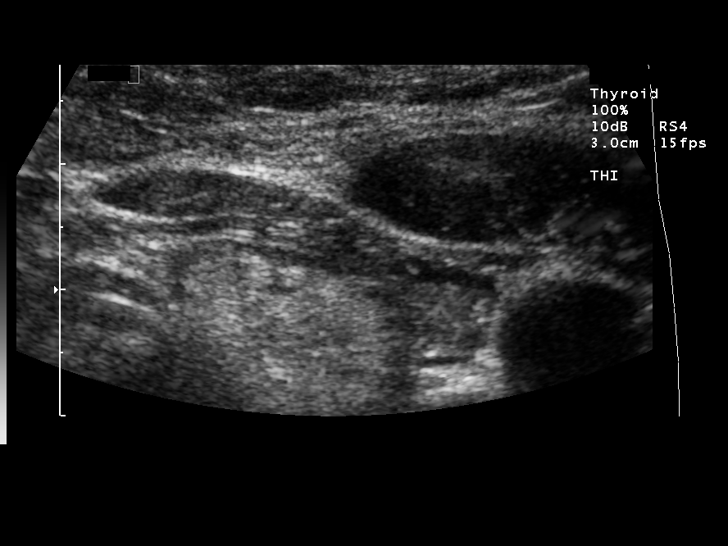
[im 12/13]
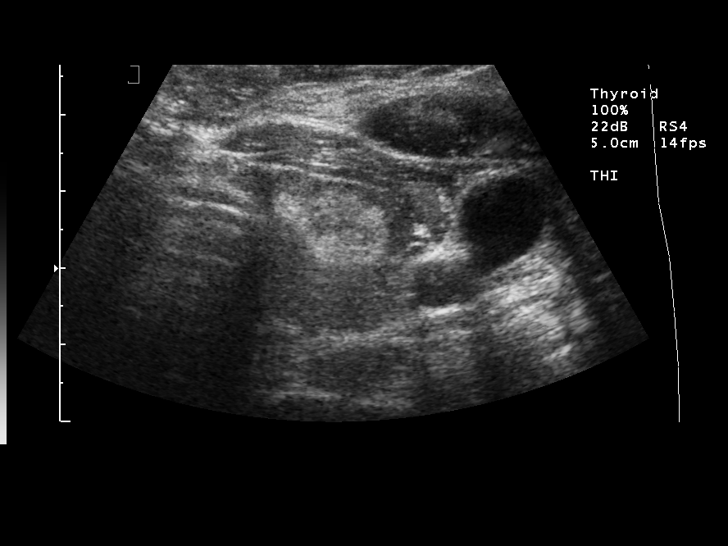
[im 13/13]
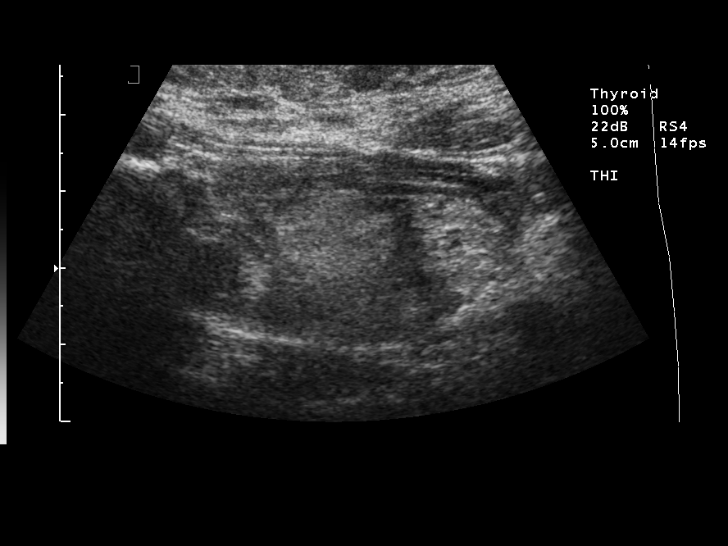

[13 of 13 positions shown; findings below may reference images not displayed]

FINDINGS: An ultrasound-guided thyroid biopsy was thoroughly discussed with the patient, and questions were answered.  Risks and benefits of the procedure were also delineated.  Risks specifically discussed included bleeding, bruising, infection, and risk of injury to adjacent blood vessels and nerves.   The patient understands and wishes to proceed.  Verbal and written consent was obtained. 
 After the patient was prepped and draped in the normal sterile fashion, 1% lidocaine was used for local anesthesia.  Using direct ultrasound guidance, four passes were made using a 25-gauge hypodermic needle into the largest nodule within the left thyroid.  Specimens were given to cytology for further analysis.  The patient tolerated the procedure well, and there were no immediate complications.  No hematoma was identified postprocedure.
IMPRESSION: Successful ultrasound-guided FNA of left lobe of the thyroid.

## 2007-09-22 ENCOUNTER — Encounter: Admission: RE | Admit: 2007-09-22 | Discharge: 2007-09-22 | Payer: Self-pay | Admitting: Surgery

## 2007-10-06 ENCOUNTER — Other Ambulatory Visit: Admission: RE | Admit: 2007-10-06 | Discharge: 2007-10-06 | Payer: Self-pay | Admitting: Interventional Radiology

## 2007-10-06 ENCOUNTER — Encounter: Admission: RE | Admit: 2007-10-06 | Discharge: 2007-10-06 | Payer: Self-pay | Admitting: Surgery

## 2007-10-06 ENCOUNTER — Encounter (INDEPENDENT_AMBULATORY_CARE_PROVIDER_SITE_OTHER): Payer: Self-pay | Admitting: Interventional Radiology

## 2007-10-24 IMAGING — US US SOFT TISSUE HEAD/NECK
1 series · 14 of 25 positions shown · non-contrast
Comparison: 04/20/06.

CLINICAL DATA: Follow-up thyroid nodules.
 THYROID ULTRASOUND:
TECHNIQUE: Ultrasound examination of the thyroid gland and adjacent soft tissue structures was performed.

[Series 1: unknown · 0.10mm/px · 14 of 52 slices shown]
[im 1/52]
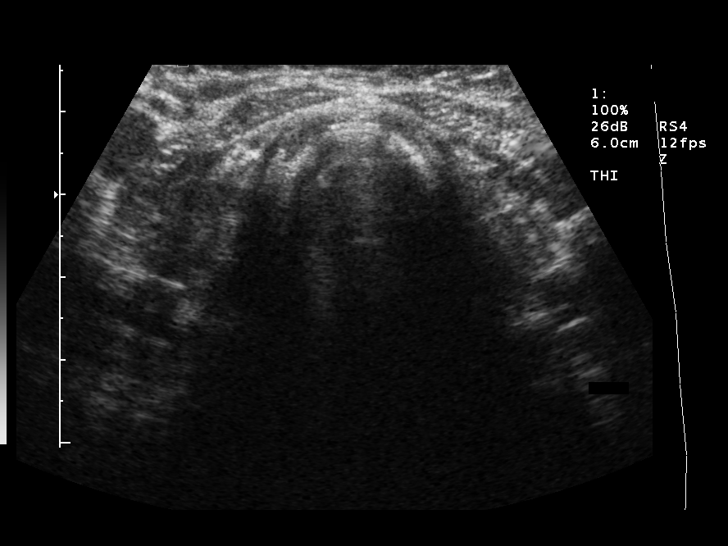
[im 5/52]
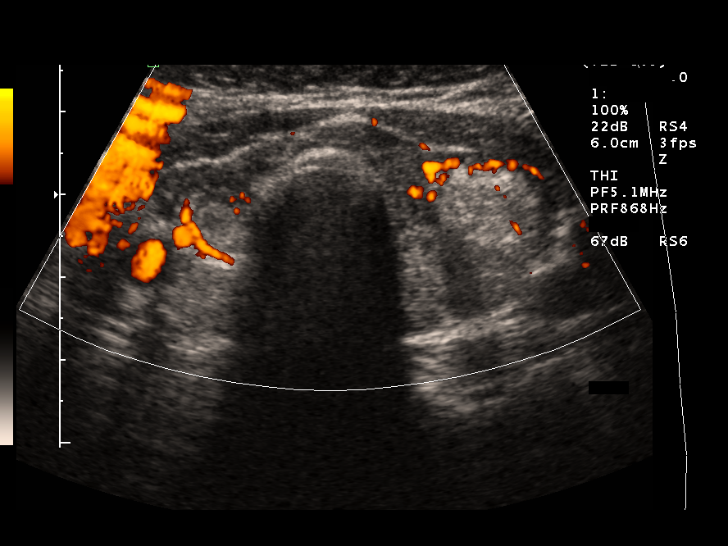
[im 9/52]
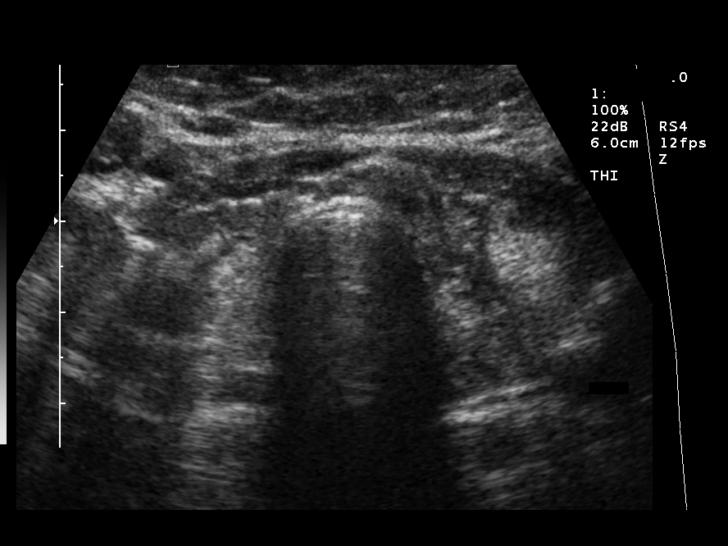
[im 13/52]
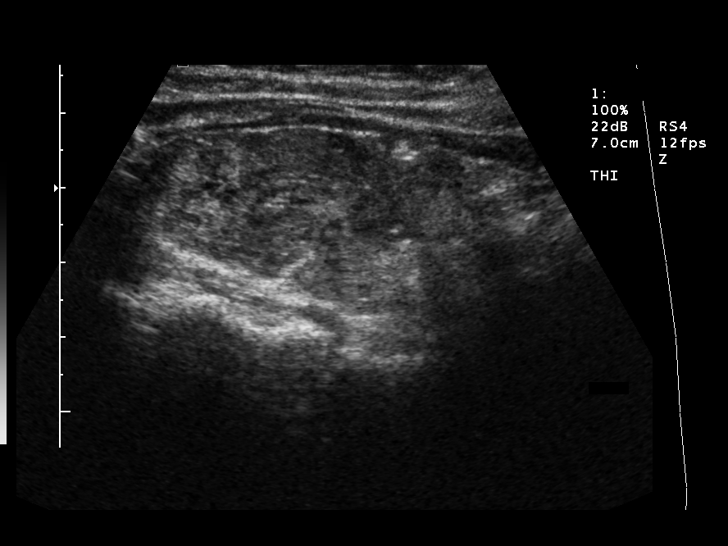
[im 18/52]
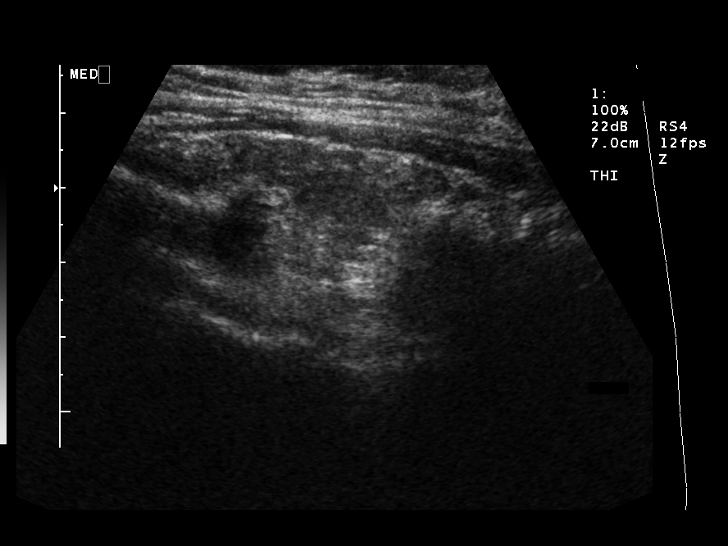
[im 20/52]
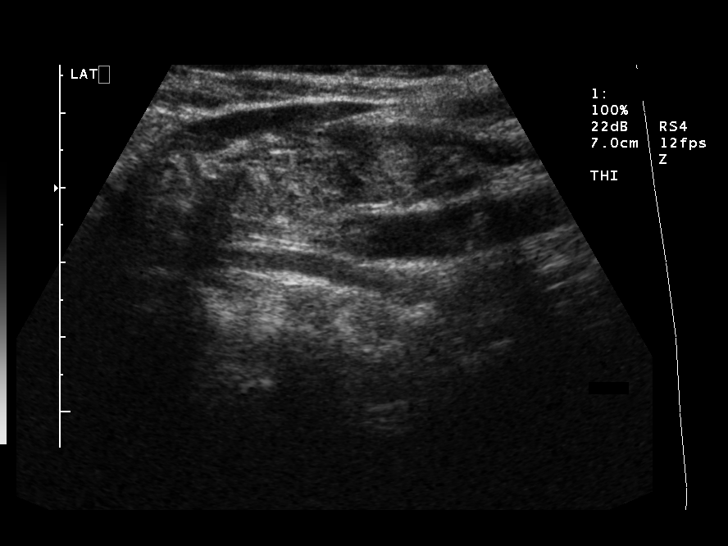
[im 24/52]
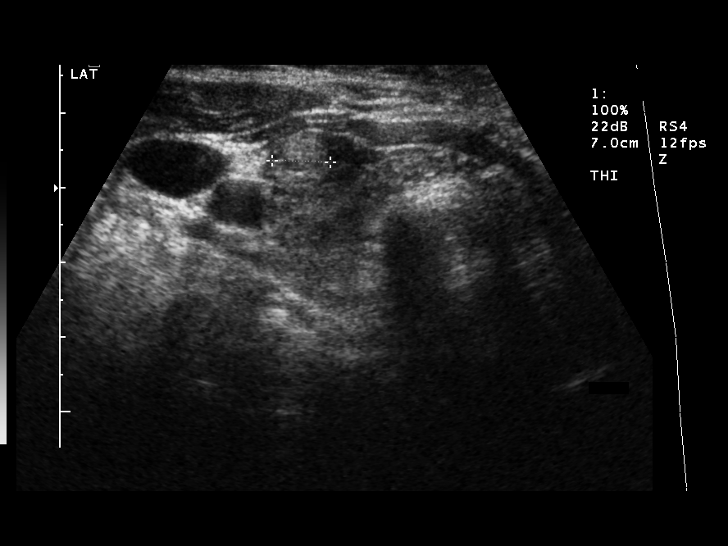
[im 28/52]
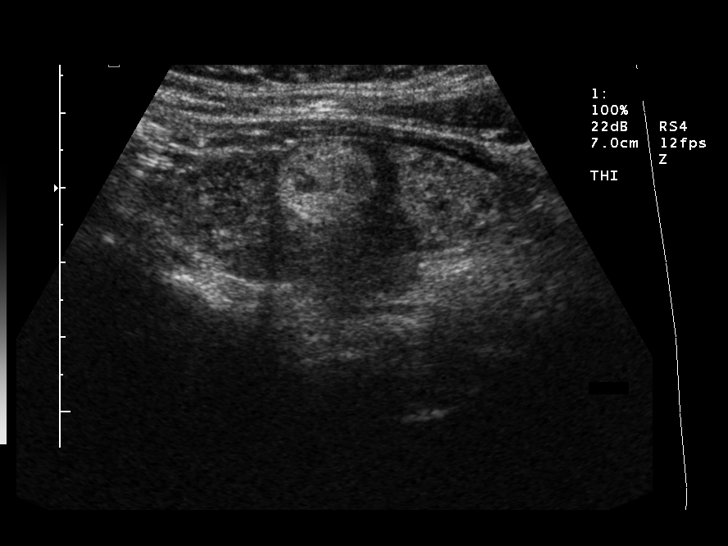
[im 32/52]
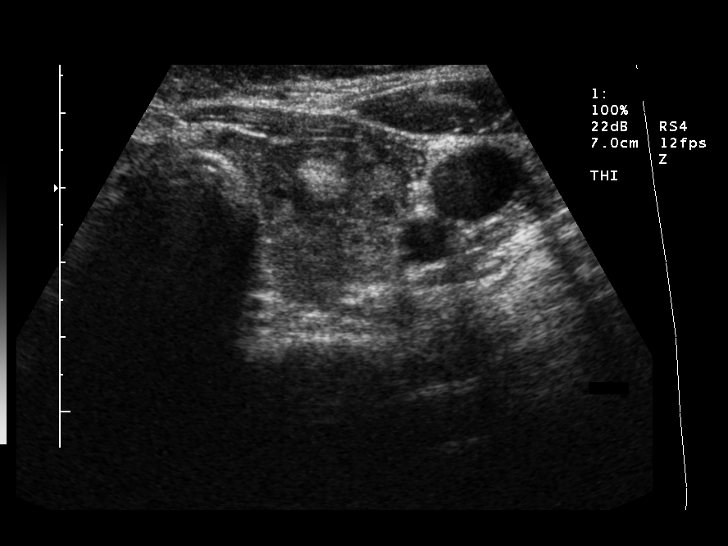
[im 35/52]
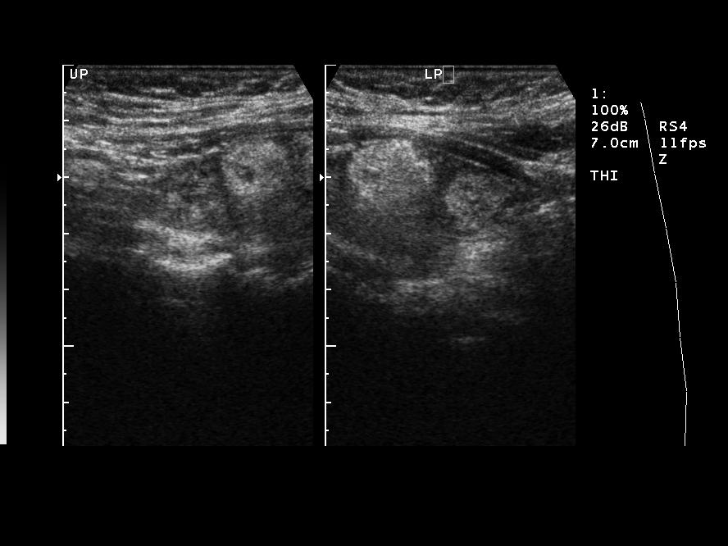
[im 39/52]
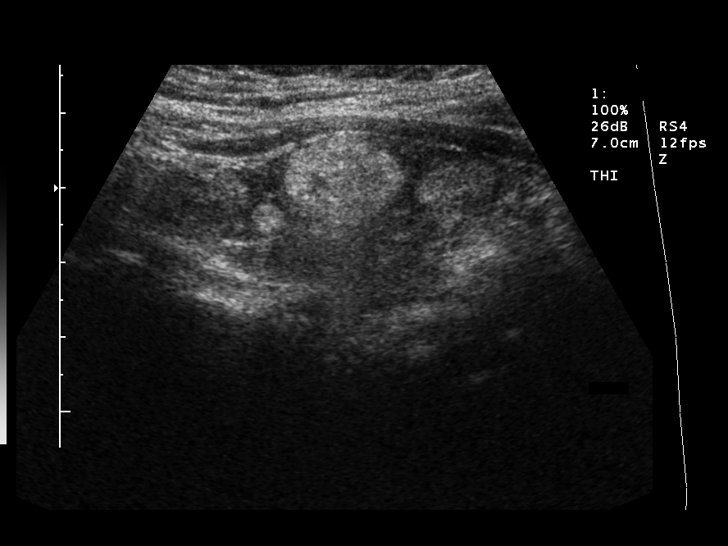
[im 43/52]
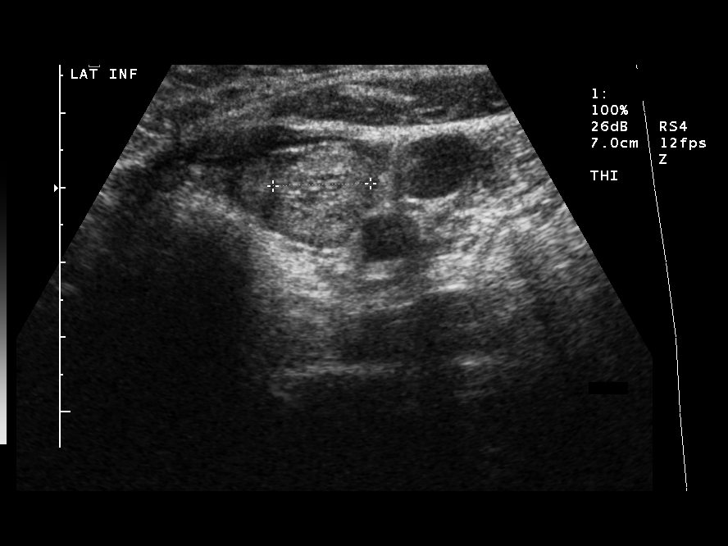
[im 47/52]
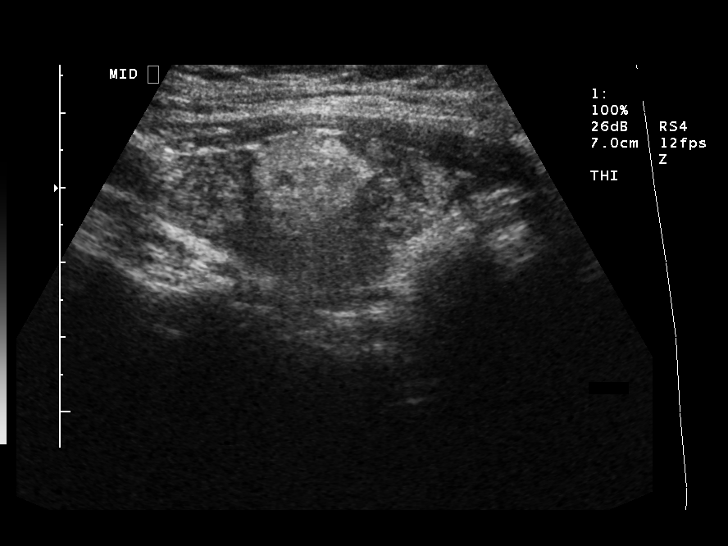
[im 52/52]
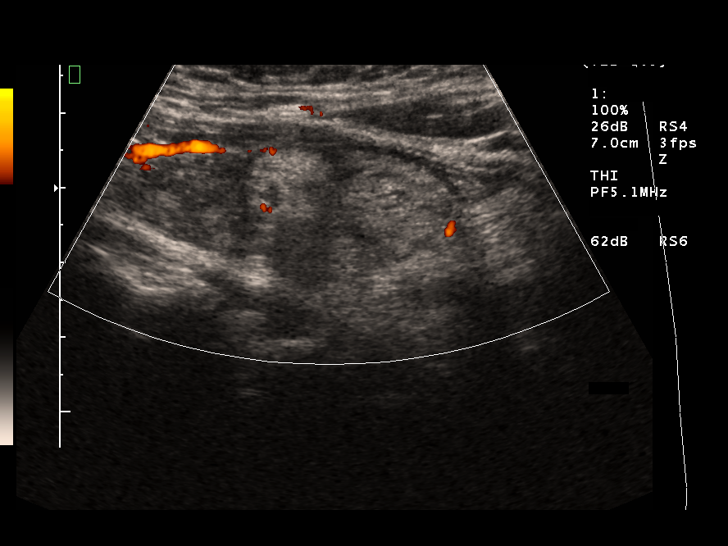

[14 of 25 positions shown; findings below may reference images not displayed]

The thyroid gland remains slightly enlarged and inhomogeneous.  The right lobe measures 6.1 cm sagittally with a depth of 2.4 cm and width of 2.2 cm.  The left lobe measures 5.5 x 2.4 x 2.3 cm with the isthmus measuring 5 mm.  The nodules noted previously are again noted and have not changed significantly in size.  Largest nodule is in the lateral left lobe of 1.7 x 1.3 x 1.8 cm with the nodule in the left lower lobe of 1.6 x 1.2 x 1.3 cm.  A smaller nodule of no more than 9 mm in diameter is stable on the right.
IMPRESSION: No change in enlarged thyroid gland which is inhomogeneous with no change in solid nodules noted previously.

## 2007-11-08 ENCOUNTER — Emergency Department (HOSPITAL_COMMUNITY): Admission: EM | Admit: 2007-11-08 | Discharge: 2007-11-08 | Payer: Self-pay | Admitting: Emergency Medicine

## 2008-04-10 ENCOUNTER — Encounter: Admission: RE | Admit: 2008-04-10 | Discharge: 2008-04-10 | Payer: Self-pay | Admitting: Obstetrics & Gynecology

## 2008-09-12 IMAGING — US US SOFT TISSUE HEAD/NECK
1 series · 13 of 25 positions shown · non-contrast
Comparison: 11/02/06

CLINICAL DATA: Followup thyroid nodules bilaterally.  
THYROID ULTRASOUND:
TECHNIQUE: Ultrasound examination of the thyroid gland and adjacent soft tissue structures was performed.

[Series 1: us soft tissue head/neck · 0.11mm/px · 13 of 41 slices shown]
[im 1/41]
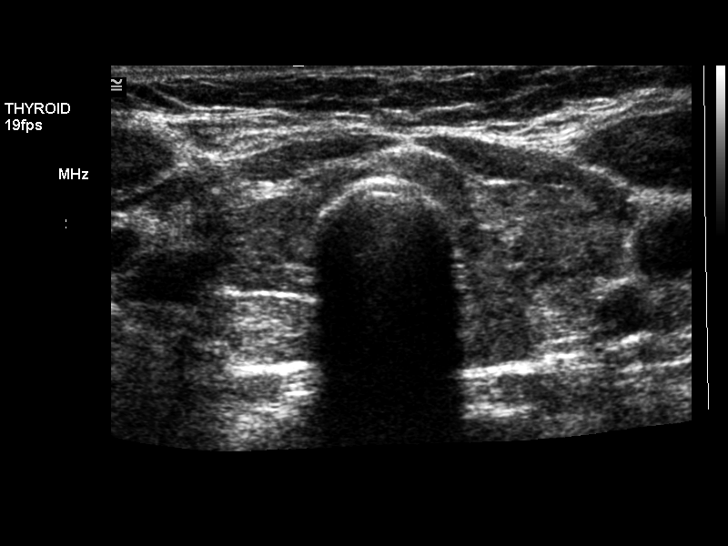
[im 4/41]
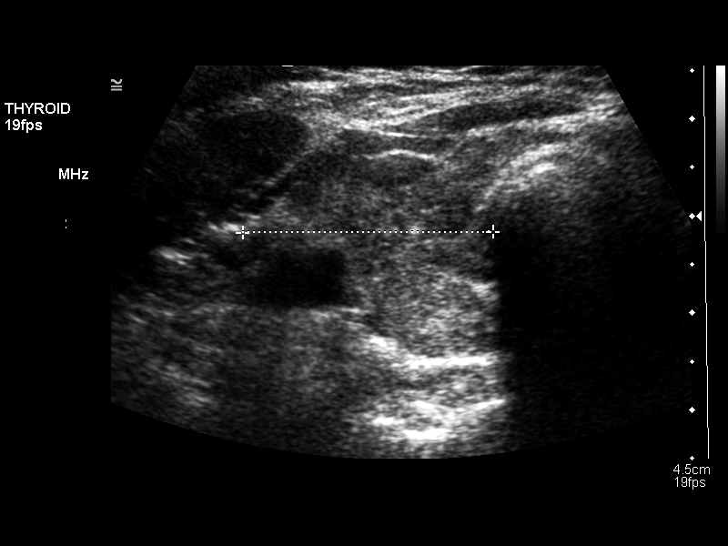
[im 7/41]
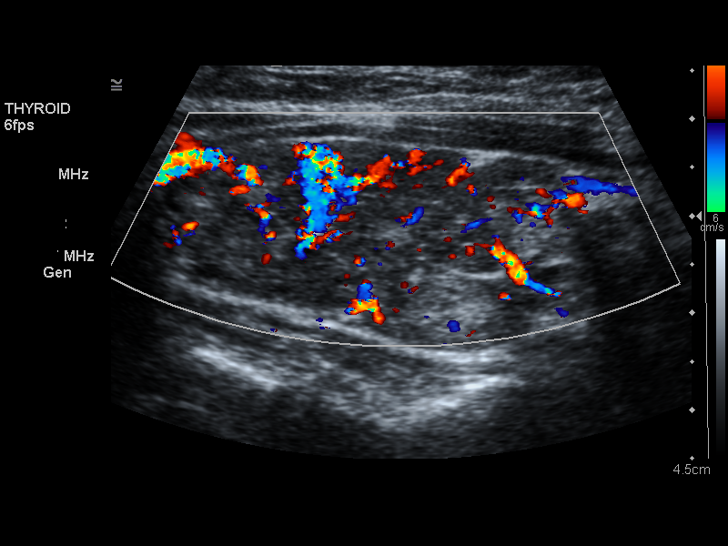
[im 11/41]
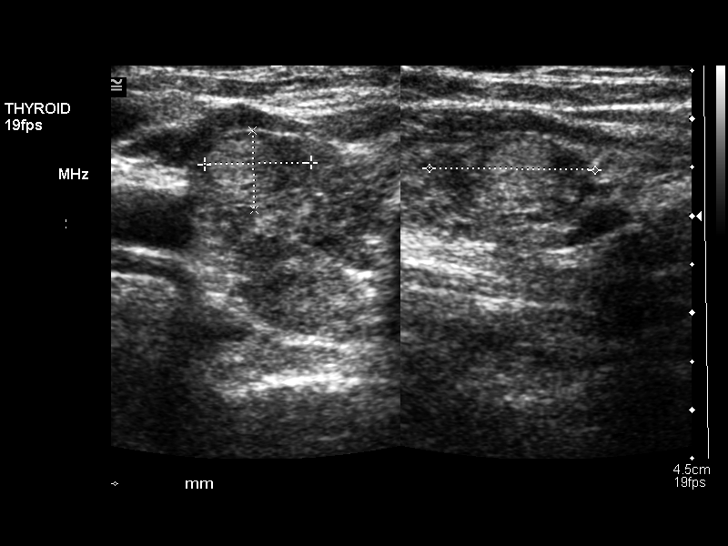
[im 14/41]
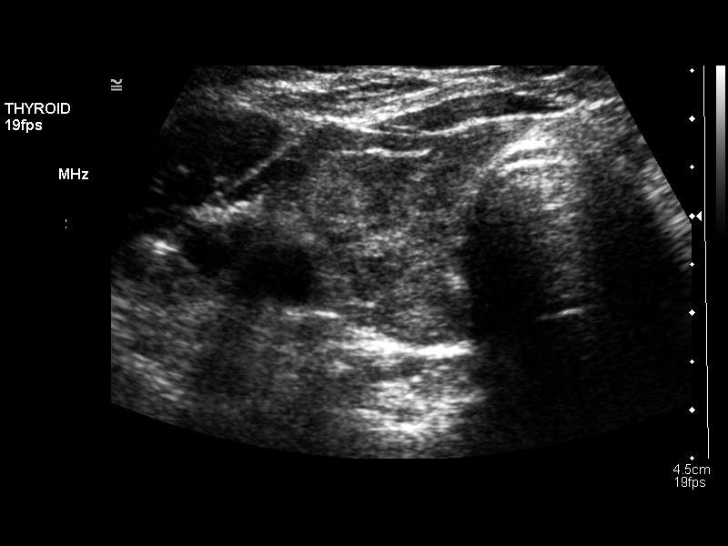
[im 17/41]
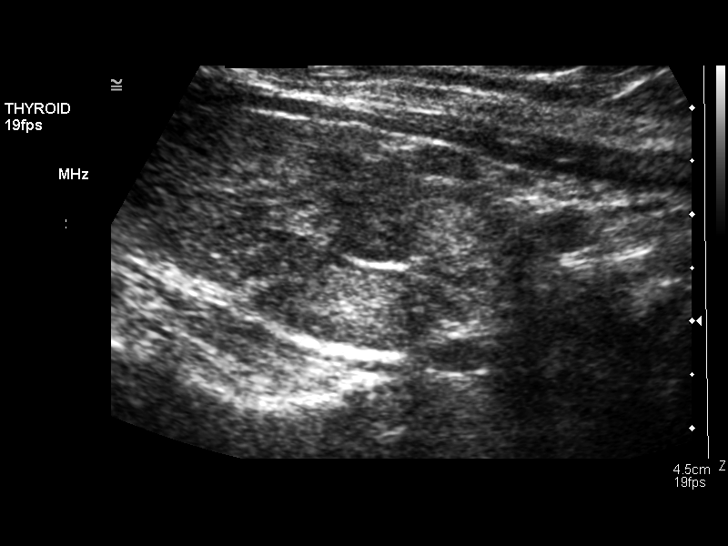
[im 21/41]
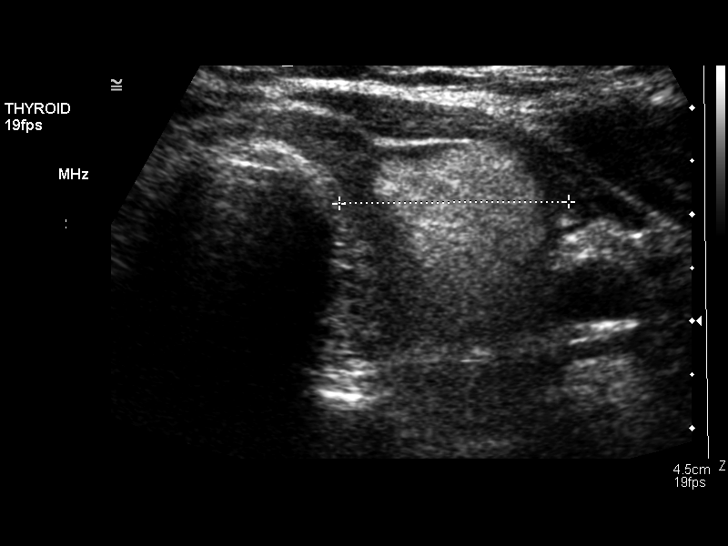
[im 24/41]
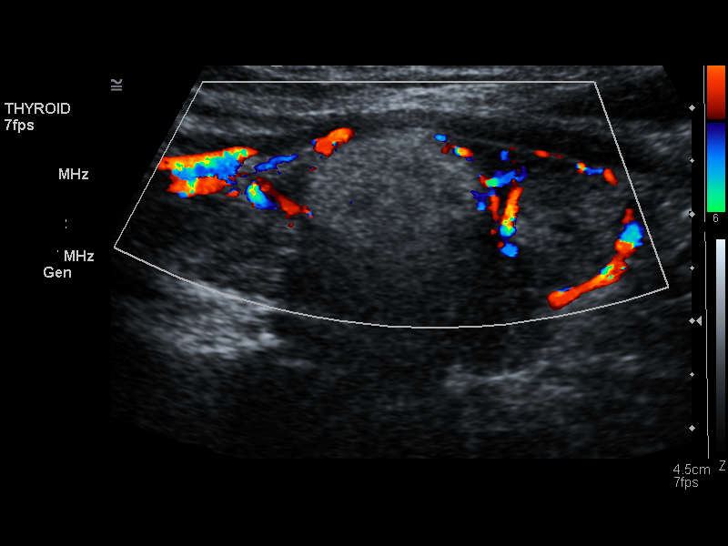
[im 27/41]
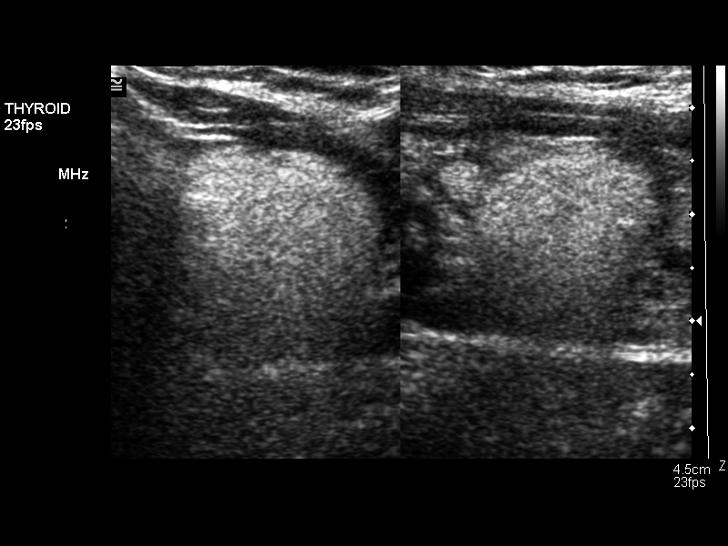
[im 31/41]
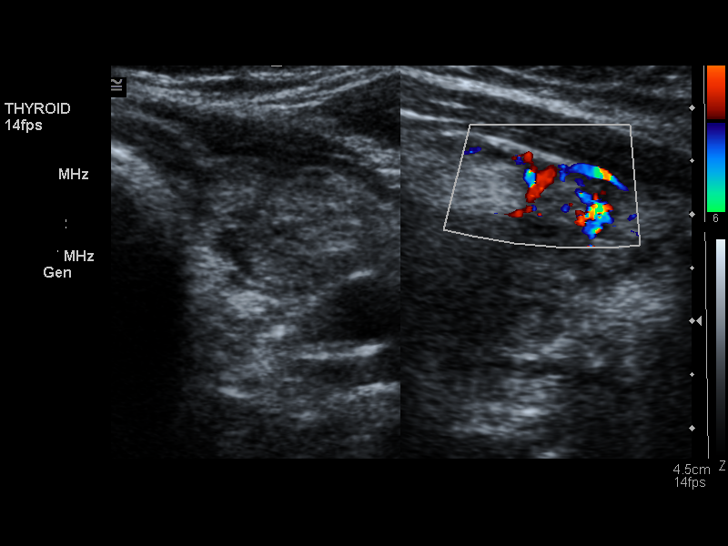
[im 34/41]
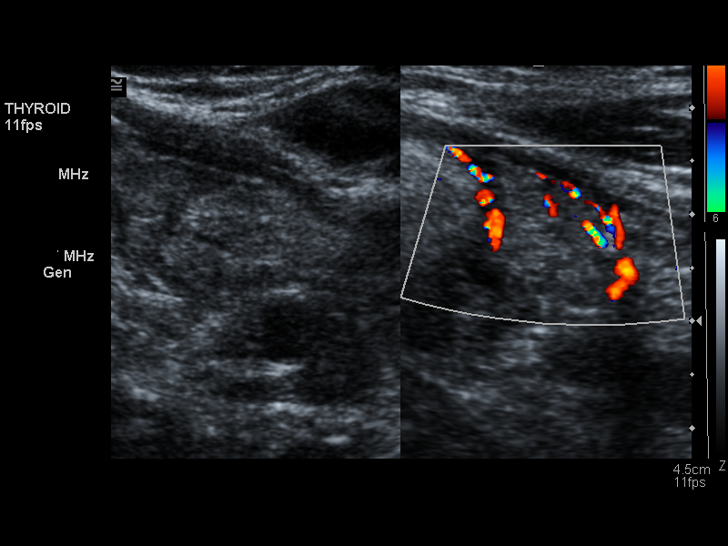
[im 37/41]
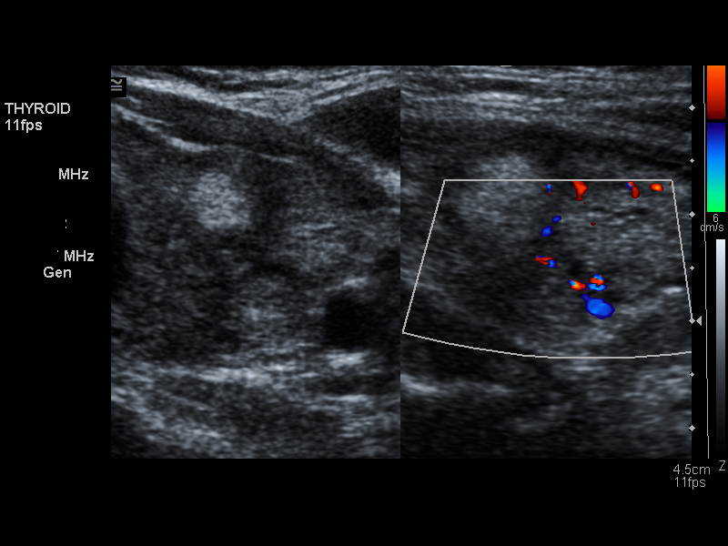
[im 41/41]
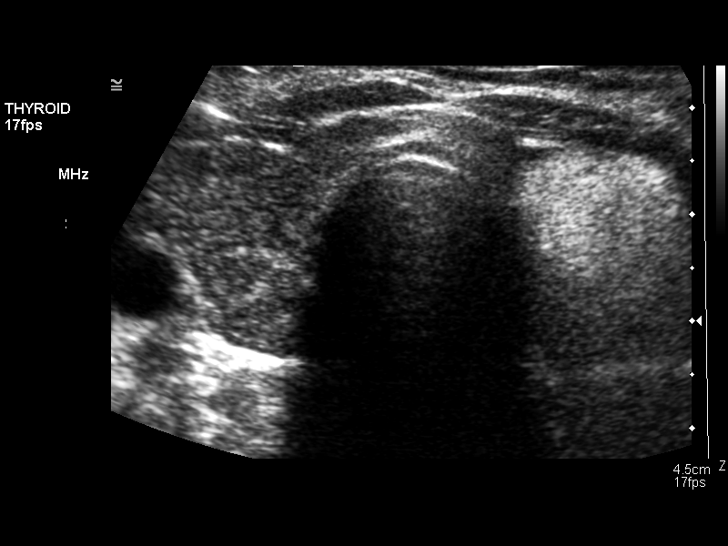

[13 of 25 positions shown; findings below may reference images not displayed]

FINDINGS: Right thyroid lobe measures 5.7cm in length and 2.2 x 2.6cm in transverse dimensions.  Left thyroid lobe measures 5.4cm in length and 1.9 x 2.2cm in transverse dimensions.  Thyroid isthmus measures 4mm in thickness.  Diffuse inhomogeneity of thyroid echotexture.  Multiple thyroid nodules are noted.  Again appreciated are multiple solid thyroid nodules bilaterally predominantly involving the mid- to inferior aspect of the left thyroid lobe.  In the midaspect of the left thyroid lobe there is a dominant solid nodule measuring 2.1 x 2.5 x 1.9cm.  It has increased in size, previously measuring 1.7 x 1.3 x 1.8cm.  Smaller solid nodules are noted in the inferior aspect of the left thyroid lobe appearing similar in size.  There is a 1.8 x 1.7 x 1.4cm solid nodule in the left lower pole.  There is also a 0.9 x 1.0 x 0.9cm eccentric nodule left lower pole.  There is a 0.8 x 0.7 x 0.5cm nodule in the medial mid- to inferior aspect of the left thyroid lobe.  0.9 x 1.4 x 1.1cm nodule in the superior pole of the right thyroid lobe.  In the midaspect of the right thyroid lobe there is a 1.1 x 0.8 x 1.7cm nodule.
IMPRESSION: Multinodular goiter is again appreciated.  Interval increase in size of a solid nodule in the midaspect of the left thyroid lobe.  For further evaluation, consider percutaneous FNA biopsy.

## 2008-09-26 IMAGING — US US BIOPSY
1 series · 13 of 13 positions shown · non-contrast
Comparison: none

CLINICAL DATA: Left thyroid nodule.
 ULTRASOUND-GUIDED NEEDLE ASPIRATE BIOPSY, LEFT LOBE OF THYROID:

[Series 1: us biopsy · 0.07mm/px · 13 acquisitions, 13 frames shown]
[im 1/13]
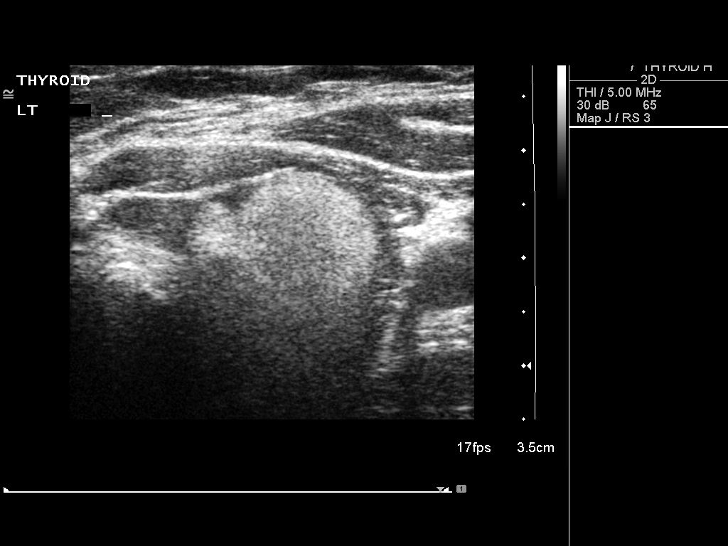
[im 2/13]
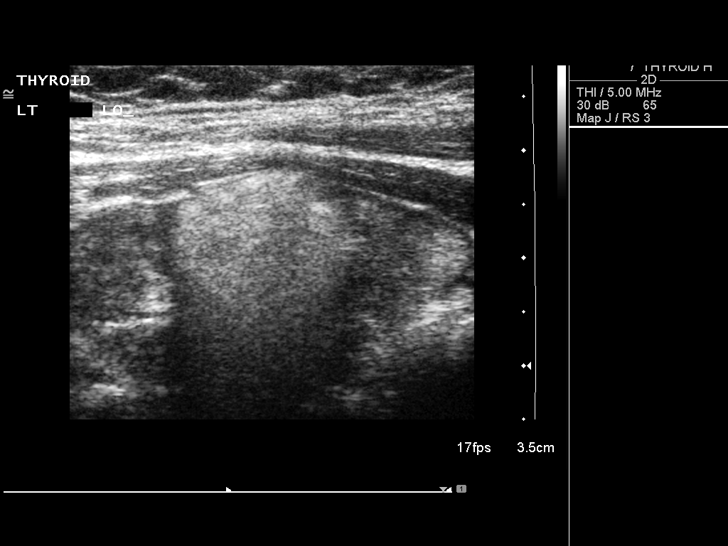
[im 3/13]
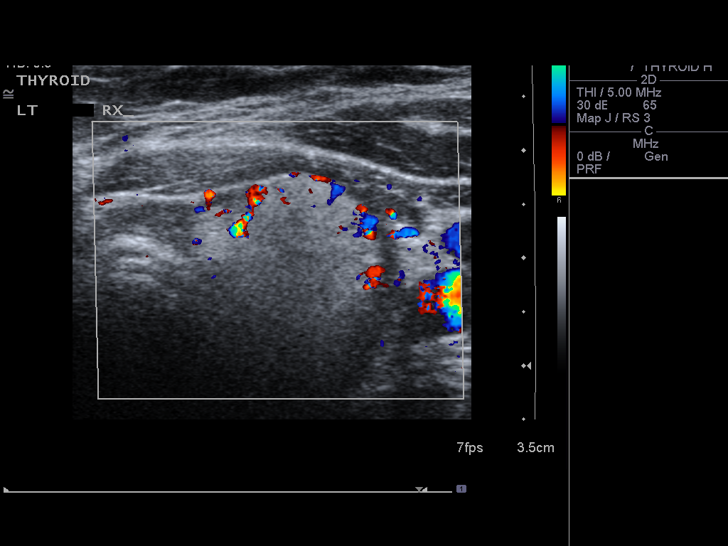
[im 4/13]
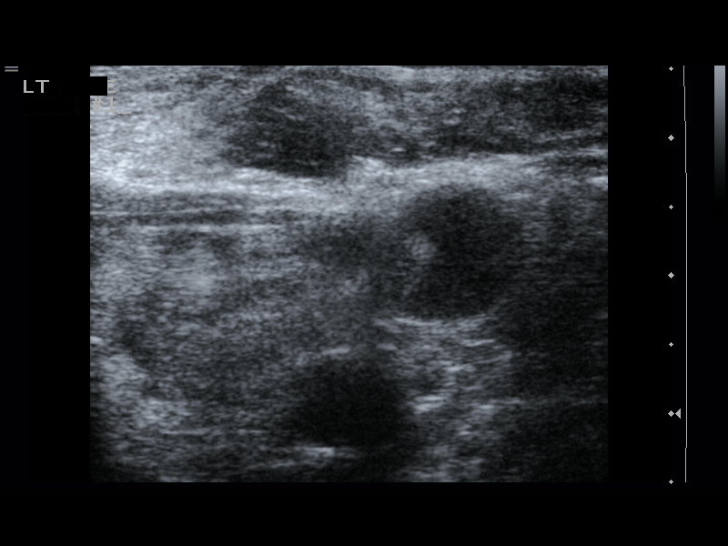
[im 5/13]
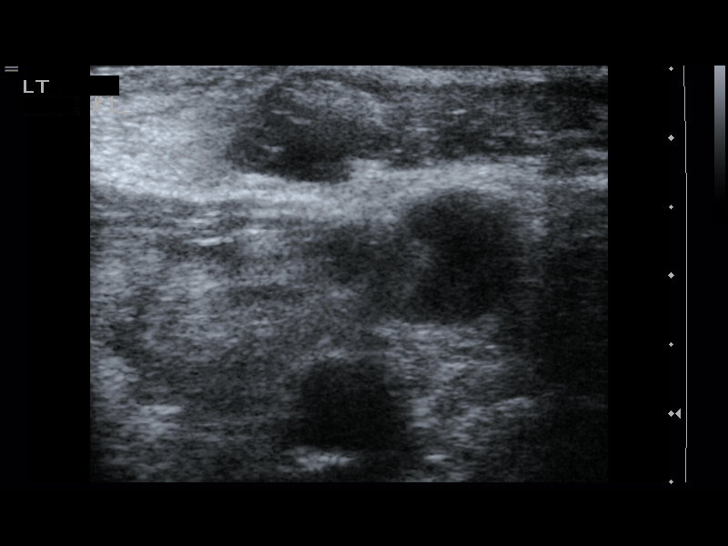
[im 6/13]
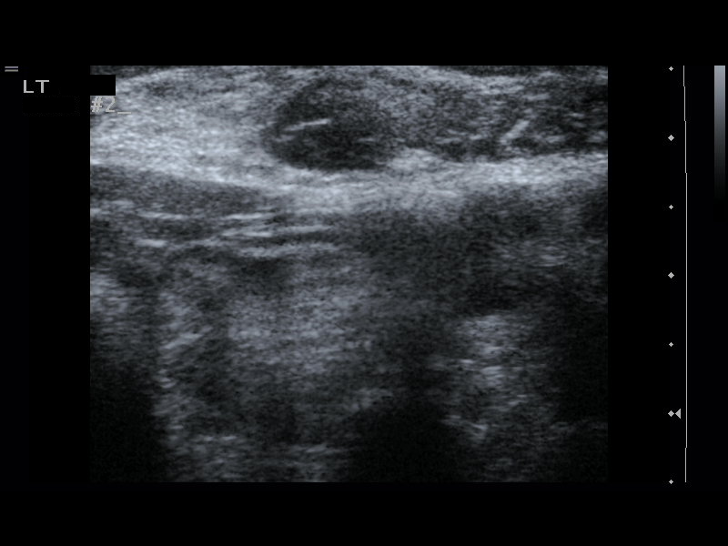
[im 7/13]
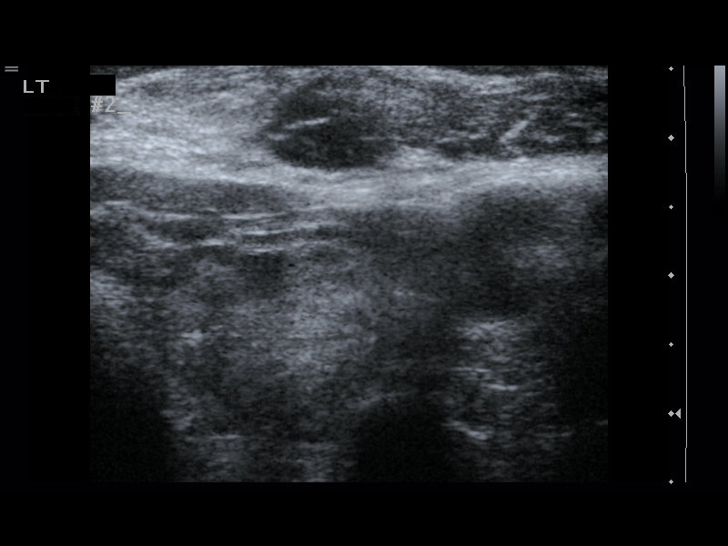
[im 8/13]
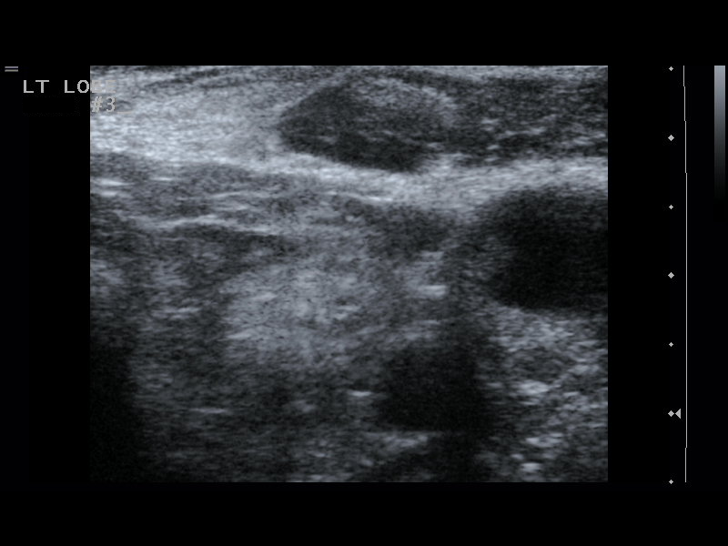
[im 9/13]
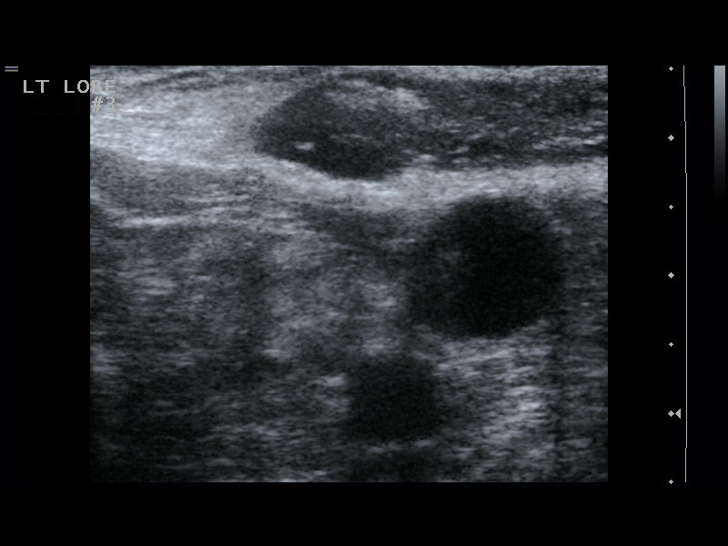
[im 10/13]
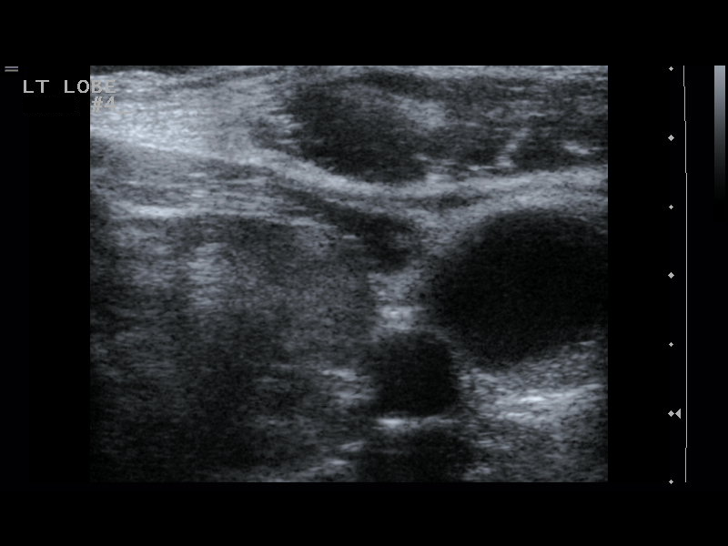
[im 11/13]
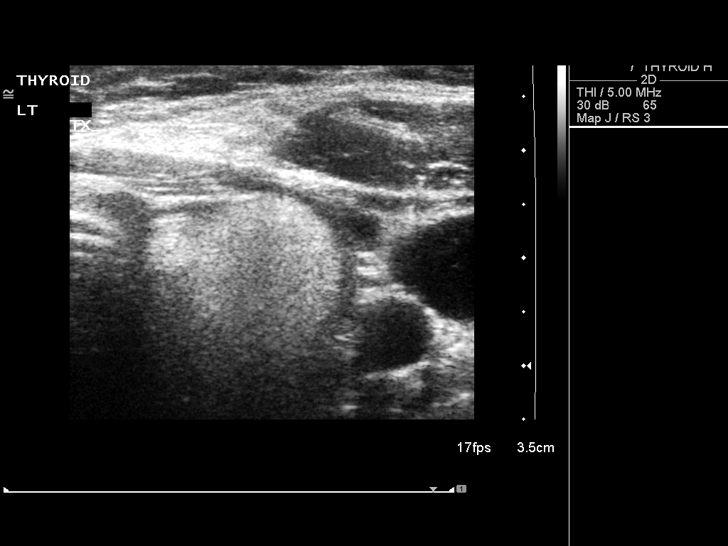
[im 12/13]
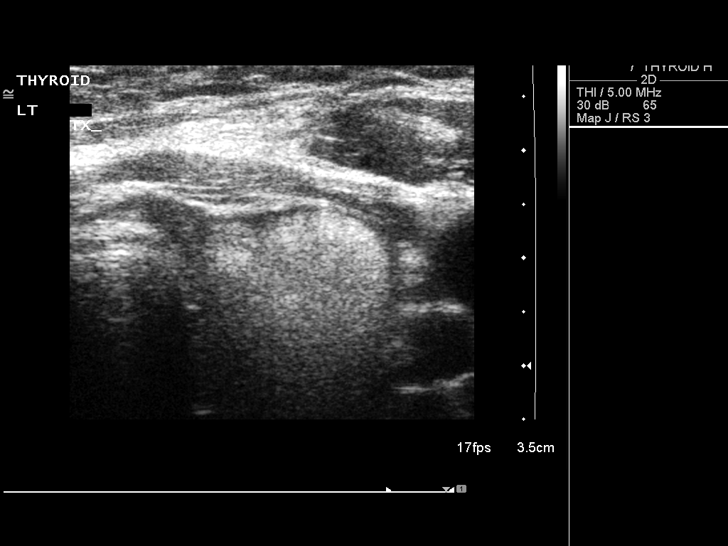
[im 13/13]
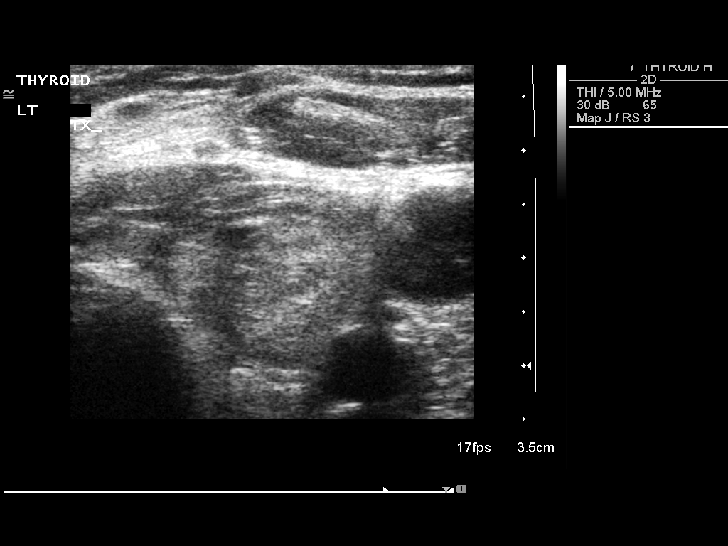

[13 of 13 positions shown; findings below may reference images not displayed]

FINDINGS: The above procedure was thoroughly discussed with the patient and written informed consent was obtained. 
 Ultrasound was then performed to localize and mark an adequate site for the biopsy.  The patient was then prepped and draped in a normal sterile fashion.  1% lidocaine was used for local anesthesia.  Using direct ultrasound guidance, four passes were made using a 25 gauge needle into the nodule located within the left lobe of the thyroid.  Ultrasound confirmed placement of the needle on all four occasions.  The specimens were given to Pathology for further analysis.  Post procedure imaging demonstrated no hematoma or immediate complication.  The patient tolerated the procedure well.
IMPRESSION: Successful ultrasound-guided needle aspirate biopsy, left lobe of the thyroid.  Final pathology pending.

## 2008-10-29 IMAGING — CT CT ABDOMEN W/ CM
2 of 5 series · 17 of 46 positions shown, 19 images · IV contrast (OMNI 300/WATER & 100 ML OMNI 300)
Comparison: 10/27/2004

CLINICAL DATA: Vomiting abdominal pain, history of ovarian cancer

CT PELVIS WITH CONTRAST,CT ABDOMEN WITH CONTRAST
TECHNIQUE: Multidetector CT imaging of the pelvis was performed
following the standard protocol during administration of
intravenous contrast.,Technique:  Multidetector CT imaging of the
abdomen was performed following the standard protocol during bolus
Contrast:
80 ml of Omnipaque

[Series 2: routine abdomen · axial · 0.80mm/px · z∈[-510,-125]mm · 14 of 87 slices shown, 16 images]
[im 5/87  soft-tissue]
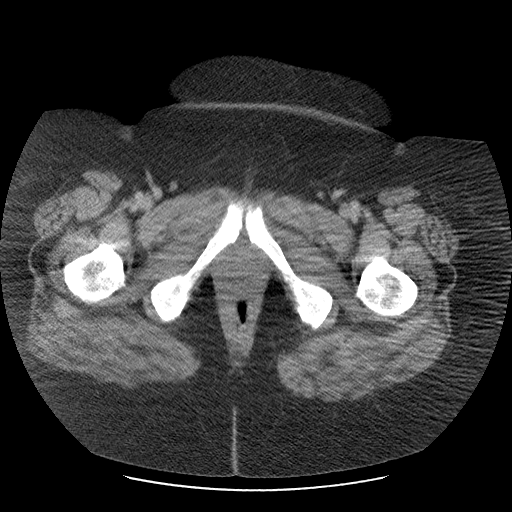
[im 5/87  bone]
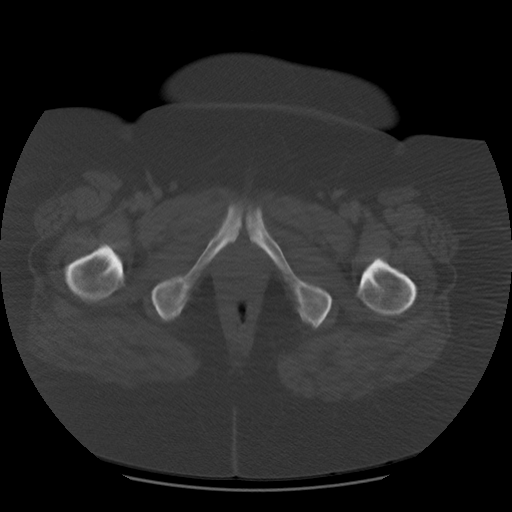
[im 13/87  soft-tissue]
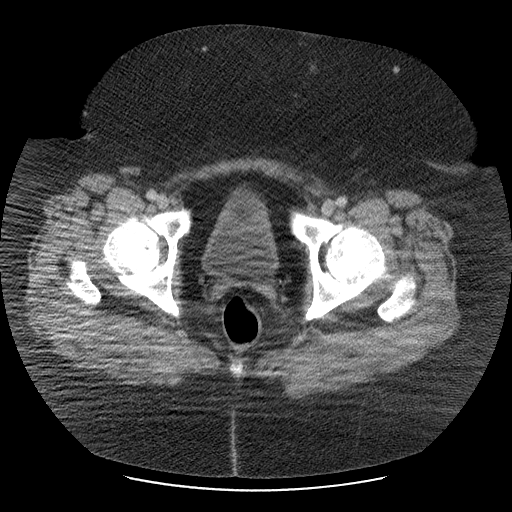
[im 18/87  soft-tissue]
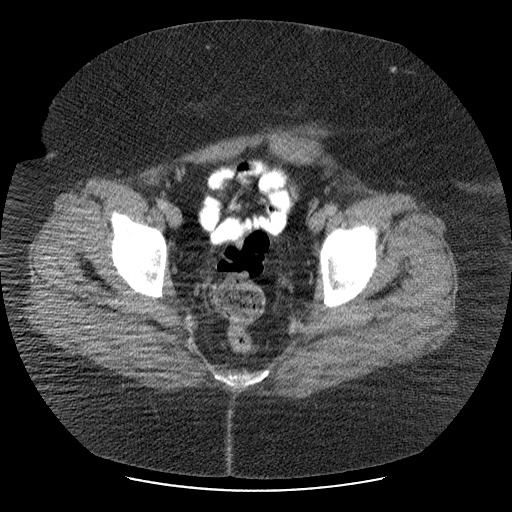
[im 22/87  soft-tissue]
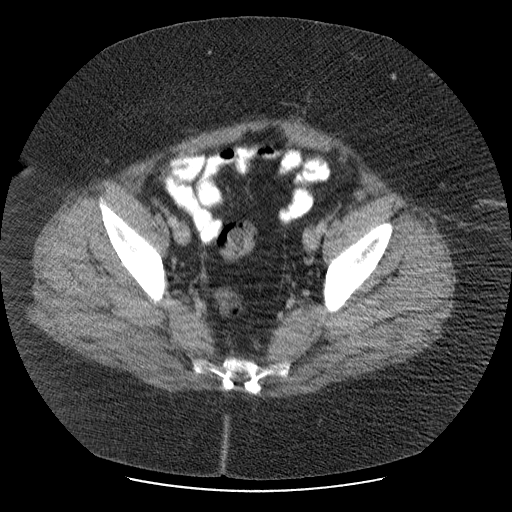
[im 31/87  soft-tissue]
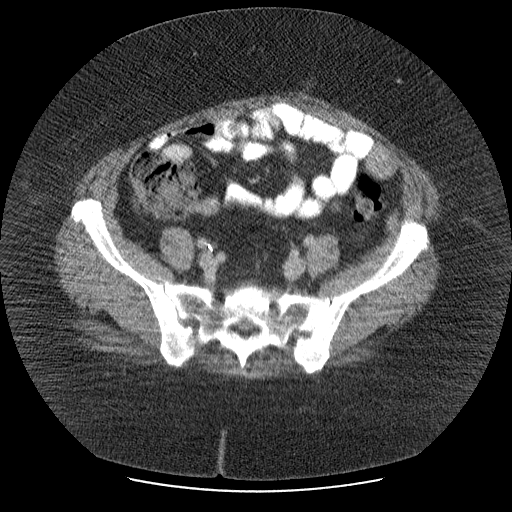
[im 35/87  soft-tissue]
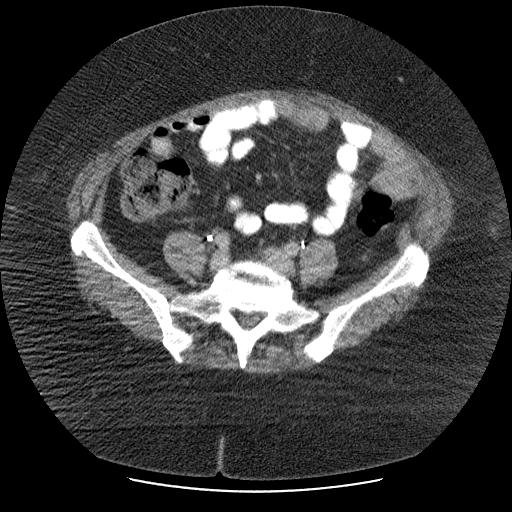
[im 39/87  soft-tissue]
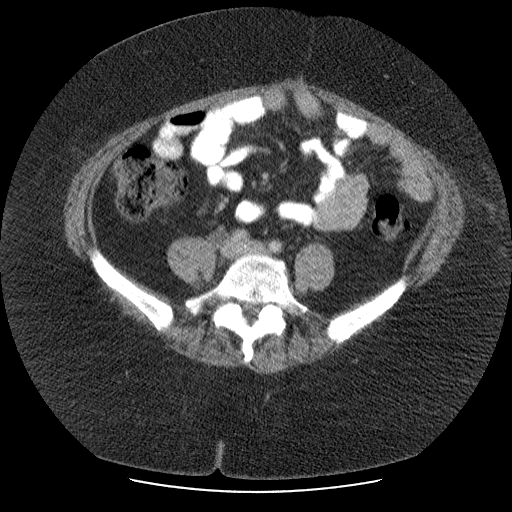
[im 48/87  soft-tissue]
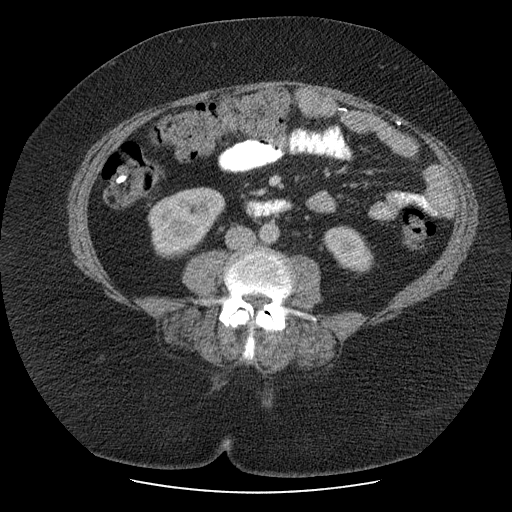
[im 52/87  soft-tissue]
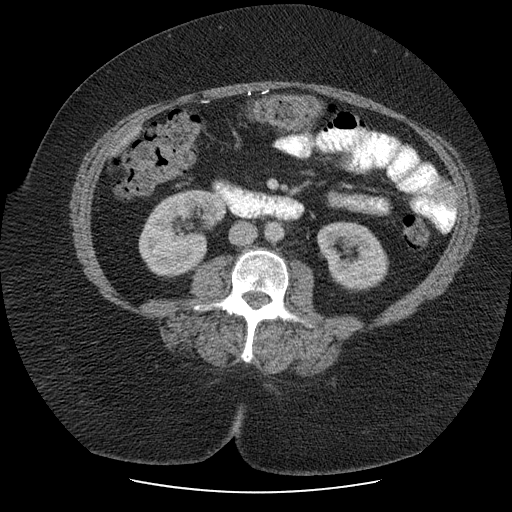
[im 52/87  bone]
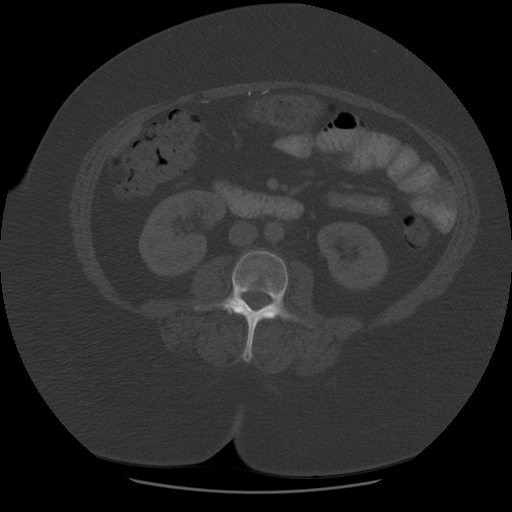
[im 56/87  soft-tissue]
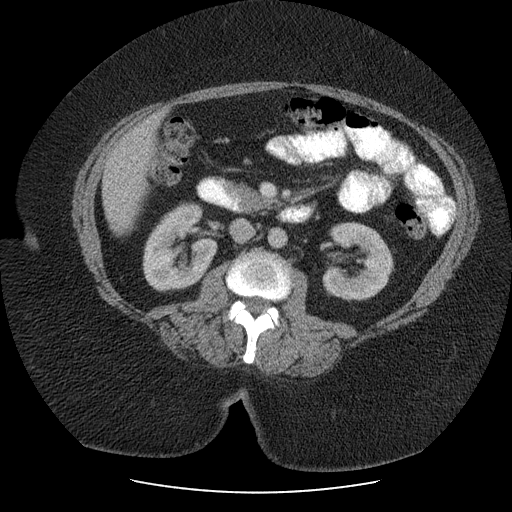
[im 65/87  soft-tissue]
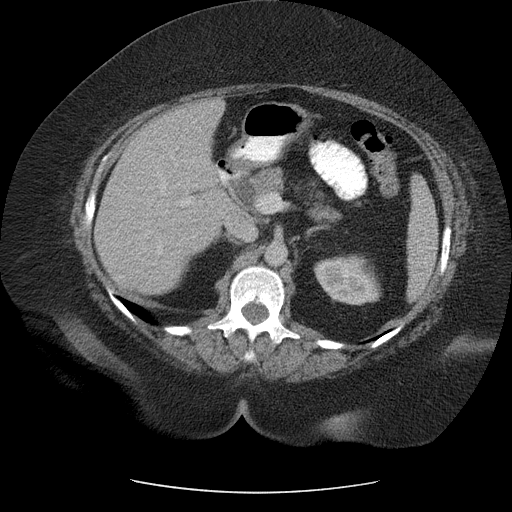
[im 69/87  soft-tissue]
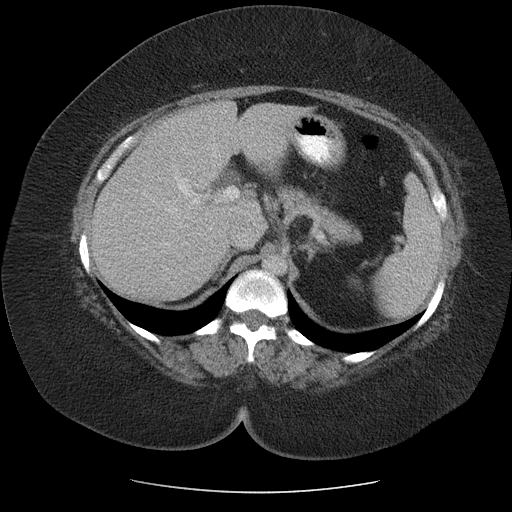
[im 74/87  soft-tissue]
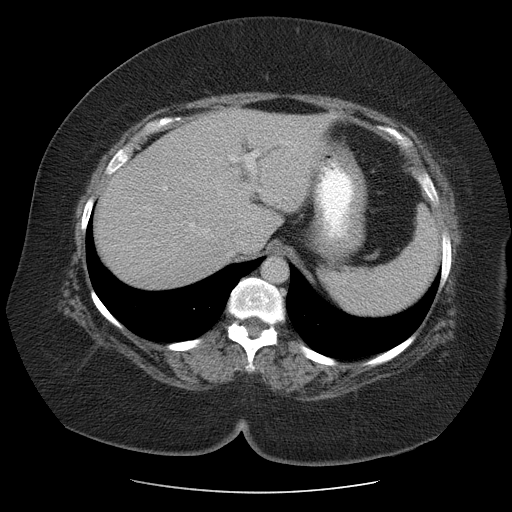
[im 82/87  soft-tissue]
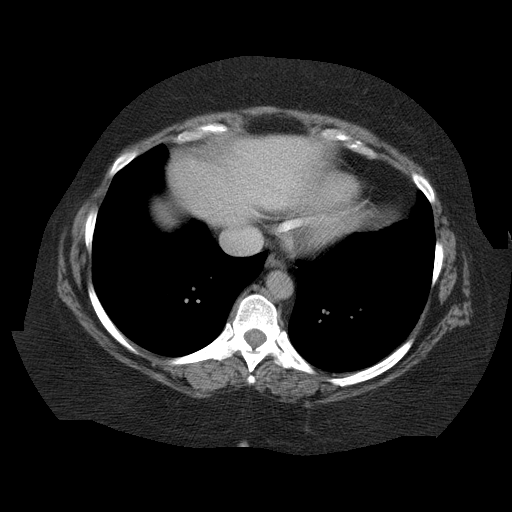

[Series 401: reformatted · coronal · 0.86mm/px · 3 of 121 slices shown]
[im 41/121  soft-tissue]
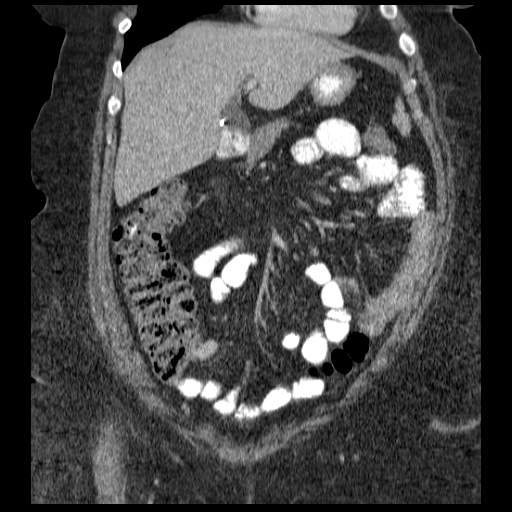
[im 54/121  soft-tissue]
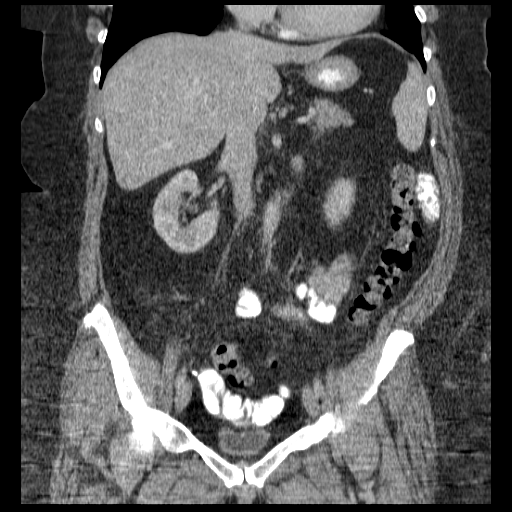
[im 67/121  soft-tissue]
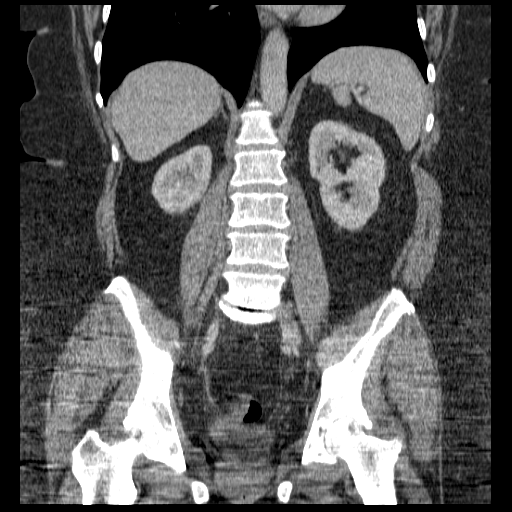

[17 of 46 positions shown; findings below may reference images not displayed]

FINDINGS: No destructive bony lesions are noted within abdomen or
pelvis.  The visualized lung bases are unremarkable.  The patient
is status post cholecystectomy.  No focal hepatic mass is noted.
The spleen, pancreas and adrenal glands are unremarkable.

Bilateral kidney are symmetrical in size and enhancement without
evidence of focal mass.  There is no hydronephrosis.  There is no
bowel obstruction.  No ascites or free air.  No adenopathy is
noted. Stool noted in the right and transverse colon. Flattening of
L5, S1 disc space noted.
IMPRESSION: 1.  No acute inflammatory process within abdomen.
2.  Status post cholecystectomy.

Findings for the pelvis:

The urinary bladder is under distended grossly unremarkable.  The
patient is status post hysterectomy.  Bilateral posterior pelvic
sidewall surgical clips are noted.  Stool noted rectosigmoid colon
without significant dilatation.  There is no pelvic ascites or
adenopathy.  No inguinal adenopathy noted.There is no pericecal
inflammation.
IMPRESSION: 1.  Status post hysterectomy.  There is no pelvic ascites or
adenopathy.
2.  No pericecal inflammation.

## 2008-11-01 ENCOUNTER — Emergency Department (HOSPITAL_COMMUNITY): Admission: EM | Admit: 2008-11-01 | Discharge: 2008-11-01 | Payer: Self-pay | Admitting: Family Medicine

## 2009-04-11 ENCOUNTER — Encounter: Admission: RE | Admit: 2009-04-11 | Discharge: 2009-04-11 | Payer: Self-pay | Admitting: Obstetrics & Gynecology

## 2009-05-09 ENCOUNTER — Inpatient Hospital Stay (HOSPITAL_COMMUNITY): Admission: EM | Admit: 2009-05-09 | Discharge: 2009-05-12 | Payer: Self-pay | Admitting: Emergency Medicine

## 2009-07-01 ENCOUNTER — Ambulatory Visit: Payer: Self-pay | Admitting: Oncology

## 2009-07-01 ENCOUNTER — Encounter: Admission: RE | Admit: 2009-07-01 | Discharge: 2009-07-01 | Payer: Self-pay | Admitting: Surgery

## 2009-07-21 LAB — CBC WITH DIFFERENTIAL/PLATELET
Basophils Absolute: 0.1 10*3/uL (ref 0.0–0.1)
HCT: 38.1 % (ref 34.8–46.6)
HGB: 11.9 g/dL (ref 11.6–15.9)
MONO#: 0.4 10*3/uL (ref 0.1–0.9)
NEUT#: 0.9 10*3/uL — ABNORMAL LOW (ref 1.5–6.5)
NEUT%: 31.2 % — ABNORMAL LOW (ref 38.4–76.8)
RDW: 15 % — ABNORMAL HIGH (ref 11.2–14.5)
WBC: 2.9 10*3/uL — ABNORMAL LOW (ref 3.9–10.3)
lymph#: 1.4 10*3/uL (ref 0.9–3.3)

## 2009-07-21 LAB — COMPREHENSIVE METABOLIC PANEL
AST: 28 U/L (ref 0–37)
Albumin: 3.6 g/dL (ref 3.5–5.2)
BUN: 11 mg/dL (ref 6–23)
Calcium: 9.3 mg/dL (ref 8.4–10.5)
Chloride: 100 mEq/L (ref 96–112)
Glucose, Bld: 86 mg/dL (ref 70–99)
Potassium: 3.4 mEq/L — ABNORMAL LOW (ref 3.5–5.3)
Sodium: 137 mEq/L (ref 135–145)
Total Protein: 7.6 g/dL (ref 6.0–8.3)

## 2009-07-21 LAB — CHCC SMEAR

## 2009-07-21 LAB — LACTATE DEHYDROGENASE: LDH: 173 U/L (ref 94–250)

## 2009-07-23 LAB — IMMUNOFIXATION ELECTROPHORESIS
IgA: 231 mg/dL (ref 68–378)
IgG (Immunoglobin G), Serum: 1040 mg/dL (ref 694–1618)
IgM, Serum: 79 mg/dL (ref 60–263)
Total Protein, Serum Electrophoresis: 8.1 g/dL (ref 6.0–8.3)

## 2009-07-31 ENCOUNTER — Ambulatory Visit: Payer: Self-pay | Admitting: Oncology

## 2009-10-23 IMAGING — CR DG ANKLE COMPLETE 3+V*L*
3 series · 3 of 3 positions shown · non-contrast
Comparison: None.

CLINICAL DATA: Left ankle pain.  No recent injuries.

LEFT ANKLE COMPLETE - 3+ VIEW 11/01/2008:

[view not recorded (1 of 3)]
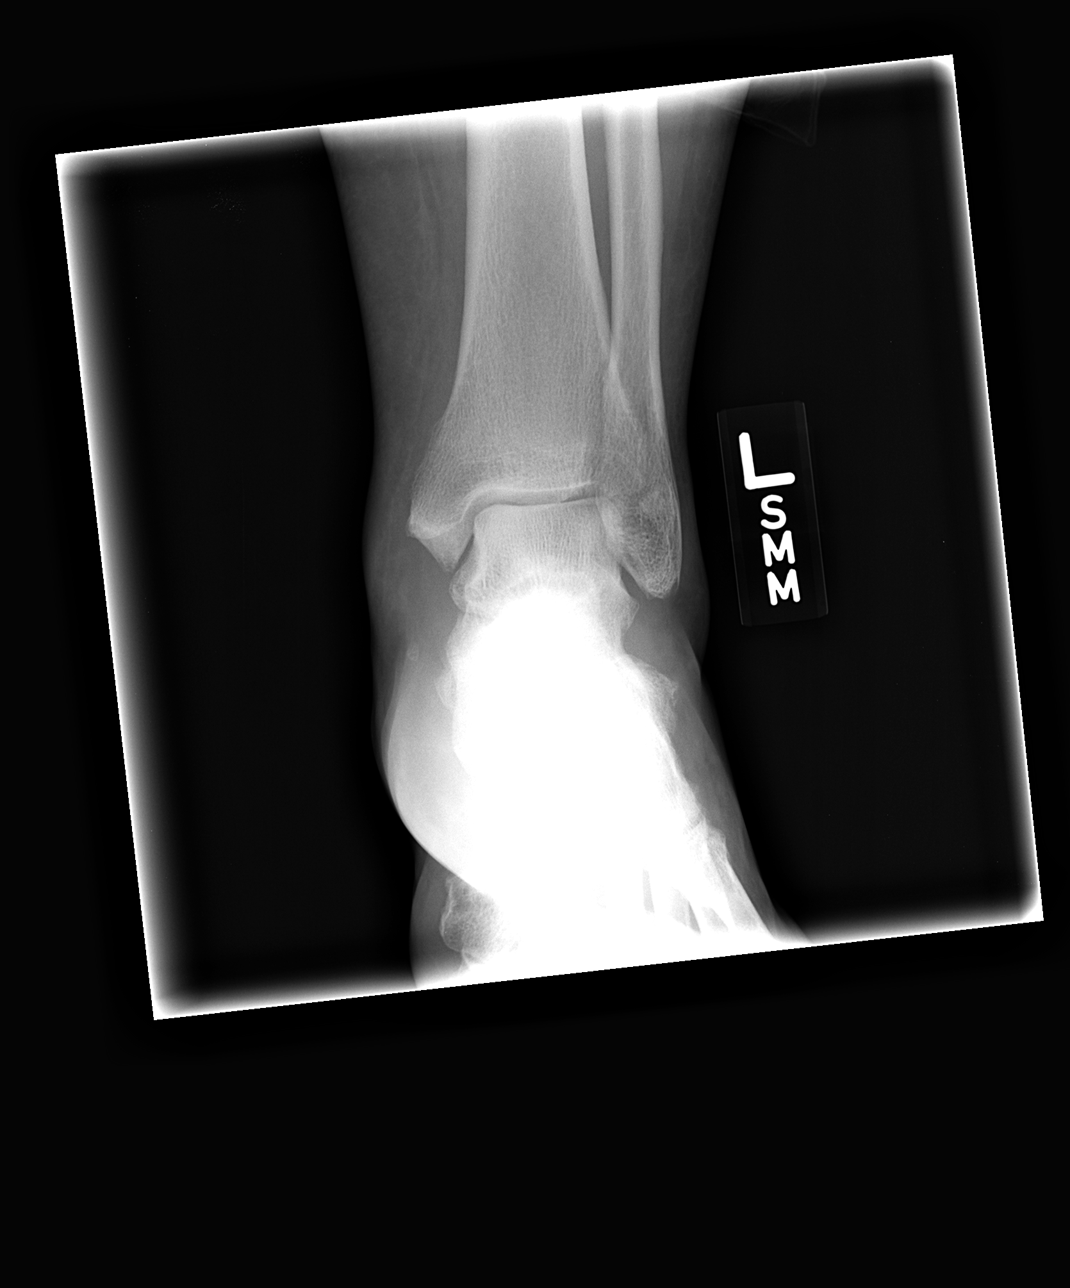

[view not recorded (2 of 3)]
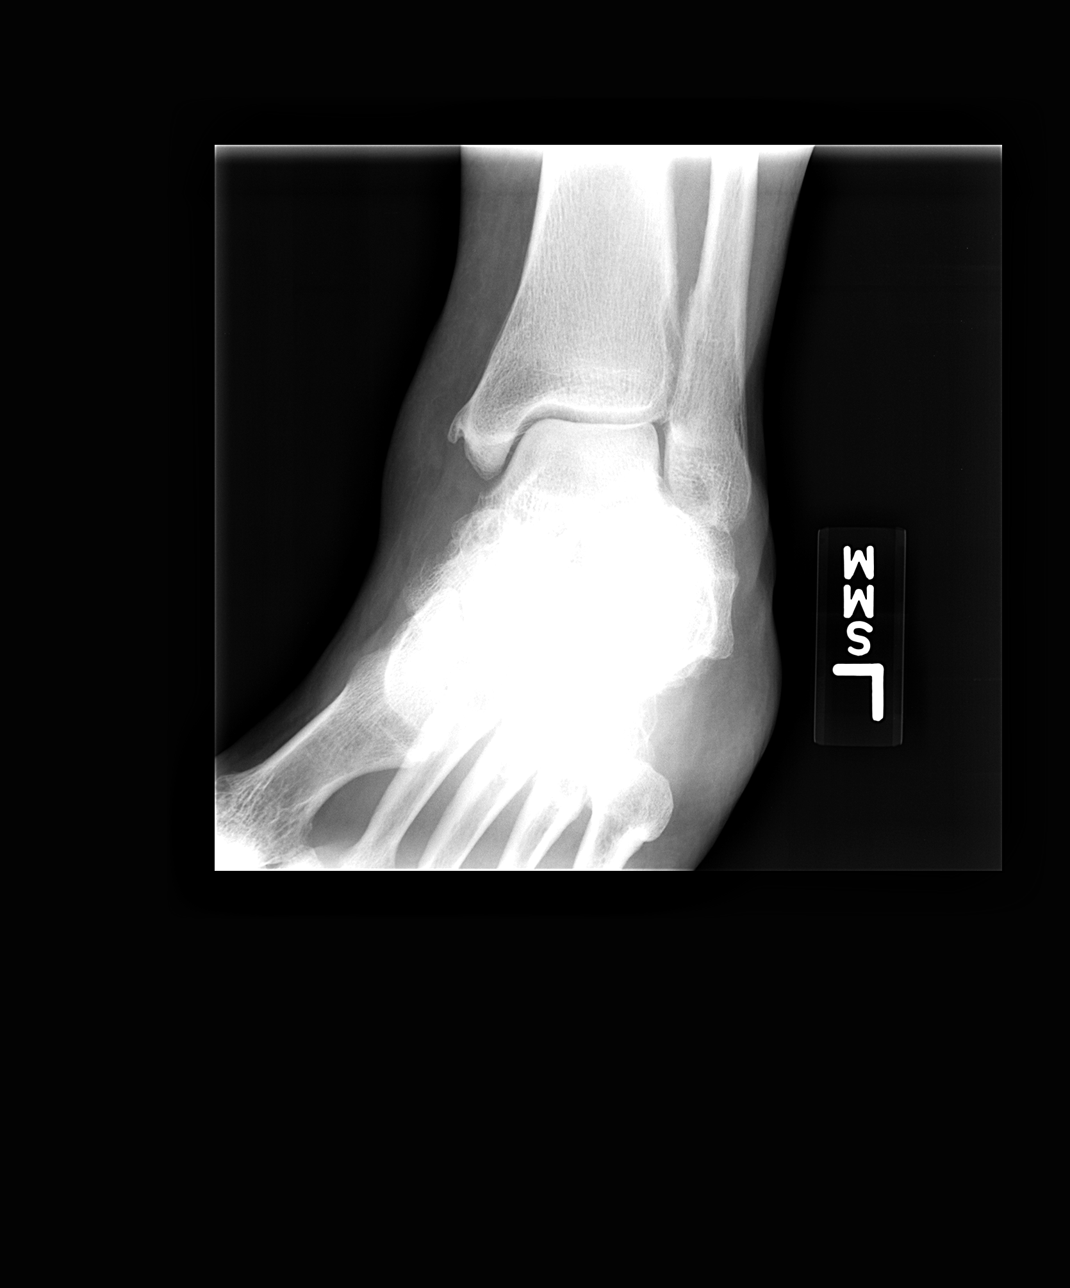

[view not recorded (3 of 3)]
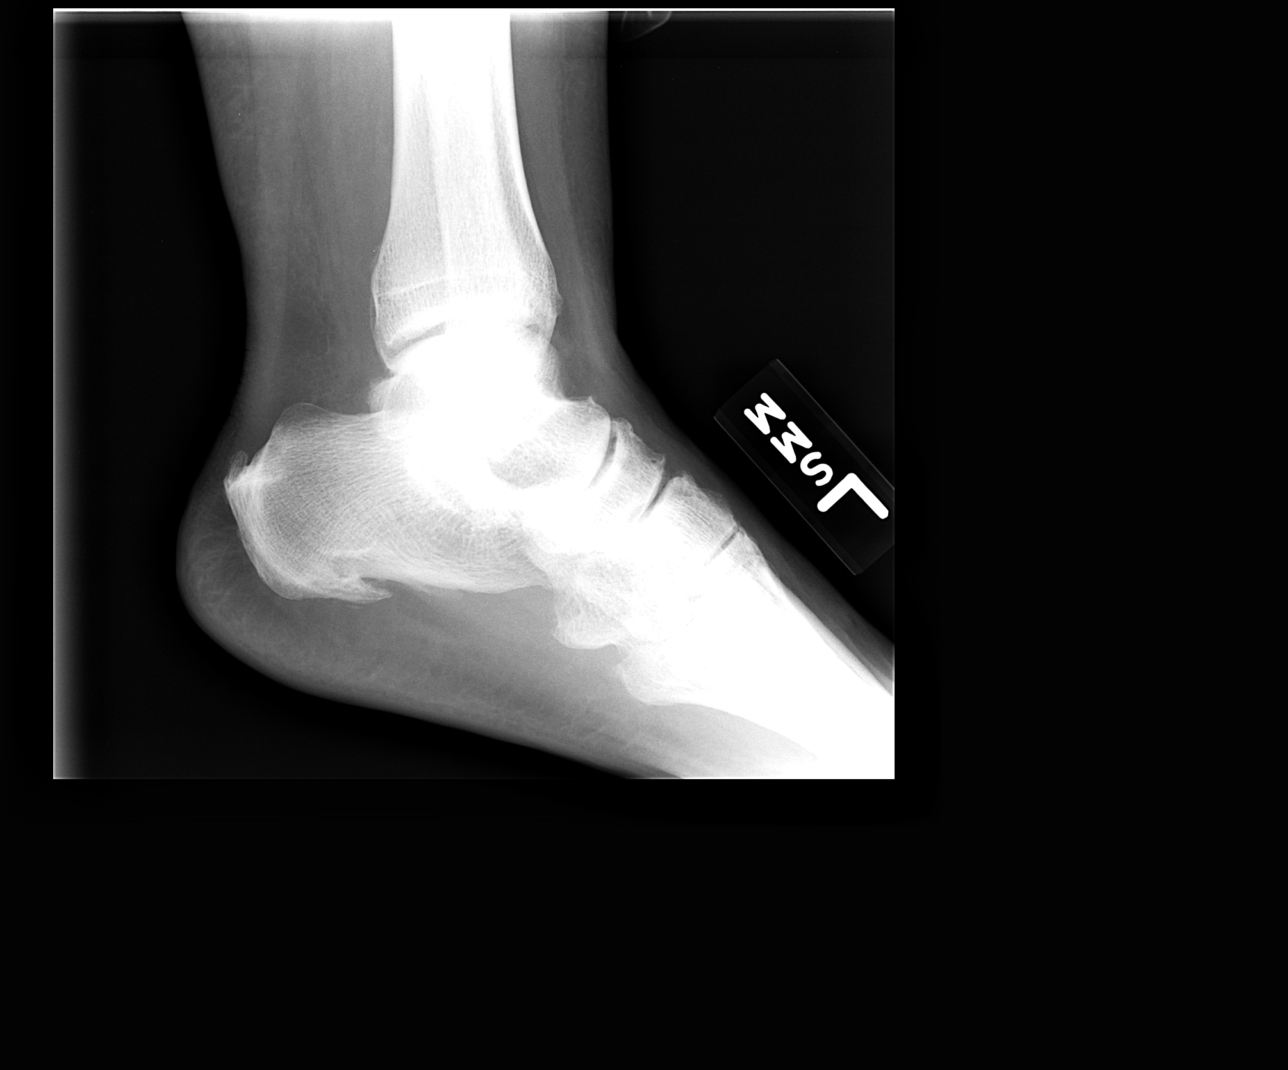

[3 of 3 positions shown; findings below may reference images not displayed]

FINDINGS: No evidence of acute or subacute fracture or dislocation.
Spur arising from the medial malleolus.  Ankle mortise intact with
well-preserved joint space.  Large plantar spur arising from the
calcaneus.  Calcification at the insertion of the Achilles tendon
on the calcaneus.  Mild degenerative changes in the visualized
hindfoot and midfoot.  Medial soft tissue swelling.
IMPRESSION: No acute or subacute skeletal abnormalities.  Degenerative changes
in the foot and ankle as described.  Large plantar calcaneal spur.

## 2009-10-31 ENCOUNTER — Ambulatory Visit: Payer: Self-pay | Admitting: Oncology

## 2009-11-04 LAB — CBC WITH DIFFERENTIAL/PLATELET
BASO%: 1.1 % (ref 0.0–2.0)
Basophils Absolute: 0 10*3/uL (ref 0.0–0.1)
EOS%: 3 % (ref 0.0–7.0)
HGB: 12 g/dL (ref 11.6–15.9)
MCH: 27.6 pg (ref 25.1–34.0)
MCHC: 32.3 g/dL (ref 31.5–36.0)
MCV: 85.5 fL (ref 79.5–101.0)
MONO%: 25.5 % — ABNORMAL HIGH (ref 0.0–14.0)
RBC: 4.35 10*6/uL (ref 3.70–5.45)
RDW: 16.4 % — ABNORMAL HIGH (ref 11.2–14.5)
lymph#: 1.4 10*3/uL (ref 0.9–3.3)
nRBC: 0 % (ref 0–0)

## 2009-11-04 LAB — MORPHOLOGY: PLT EST: ADEQUATE

## 2010-01-29 ENCOUNTER — Ambulatory Visit: Payer: Self-pay | Admitting: Oncology

## 2010-02-03 LAB — CBC WITH DIFFERENTIAL/PLATELET
Basophils Absolute: 0 10*3/uL (ref 0.0–0.1)
Eosinophils Absolute: 0.1 10*3/uL (ref 0.0–0.5)
HGB: 11.5 g/dL — ABNORMAL LOW (ref 11.6–15.9)
MCV: 86.4 fL (ref 79.5–101.0)
MONO#: 0.4 10*3/uL (ref 0.1–0.9)
MONO%: 23.2 % — ABNORMAL HIGH (ref 0.0–14.0)
NEUT#: 0.1 10*3/uL — CL (ref 1.5–6.5)
RBC: 4.13 10*6/uL (ref 3.70–5.45)
RDW: 16.3 % — ABNORMAL HIGH (ref 11.2–14.5)
WBC: 1.8 10*3/uL — ABNORMAL LOW (ref 3.9–10.3)
nRBC: 0 % (ref 0–0)

## 2010-02-03 LAB — MORPHOLOGY

## 2010-03-02 ENCOUNTER — Ambulatory Visit: Payer: Self-pay | Admitting: Oncology

## 2010-03-05 ENCOUNTER — Ambulatory Visit (HOSPITAL_COMMUNITY): Admission: RE | Admit: 2010-03-05 | Discharge: 2010-03-05 | Payer: Self-pay | Admitting: Gastroenterology

## 2010-03-05 LAB — CBC WITH DIFFERENTIAL/PLATELET
EOS%: 2.2 % (ref 0.0–7.0)
LYMPH%: 36.7 % (ref 14.0–49.7)
MCH: 27.6 pg (ref 25.1–34.0)
MCV: 87 fL (ref 79.5–101.0)
MONO%: 29.7 % — ABNORMAL HIGH (ref 0.0–14.0)
Platelets: 270 10*3/uL (ref 145–400)
RBC: 4.31 10*6/uL (ref 3.70–5.45)
RDW: 15.8 % — ABNORMAL HIGH (ref 11.2–14.5)
nRBC: 0 % (ref 0–0)

## 2010-04-02 ENCOUNTER — Ambulatory Visit: Payer: Self-pay | Admitting: Oncology

## 2010-04-07 LAB — MORPHOLOGY

## 2010-04-07 LAB — CBC WITH DIFFERENTIAL/PLATELET
BASO%: 0.5 % (ref 0.0–2.0)
EOS%: 3.1 % (ref 0.0–7.0)
HGB: 11.6 g/dL (ref 11.6–15.9)
MCH: 27.7 pg (ref 25.1–34.0)
MCHC: 32.3 g/dL (ref 31.5–36.0)
RBC: 4.2 10*6/uL (ref 3.70–5.45)
RDW: 16.6 % — ABNORMAL HIGH (ref 11.2–14.5)
lymph#: 1.5 10*3/uL (ref 0.9–3.3)

## 2010-04-14 ENCOUNTER — Encounter: Admission: RE | Admit: 2010-04-14 | Discharge: 2010-04-14 | Payer: Self-pay | Admitting: Obstetrics & Gynecology

## 2010-04-19 ENCOUNTER — Emergency Department (HOSPITAL_COMMUNITY): Admission: EM | Admit: 2010-04-19 | Discharge: 2010-04-20 | Payer: Self-pay | Admitting: Emergency Medicine

## 2010-04-29 IMAGING — CR DG CHEST 1V PORT
1 series · 3 of 3 positions shown · non-contrast
Comparison: None

CLINICAL DATA: Fever.

PORTABLE CHEST - 1 VIEW

[Series 1: AP · 0.16mm/px · 3 of 3 slices shown]
[im 1/3]
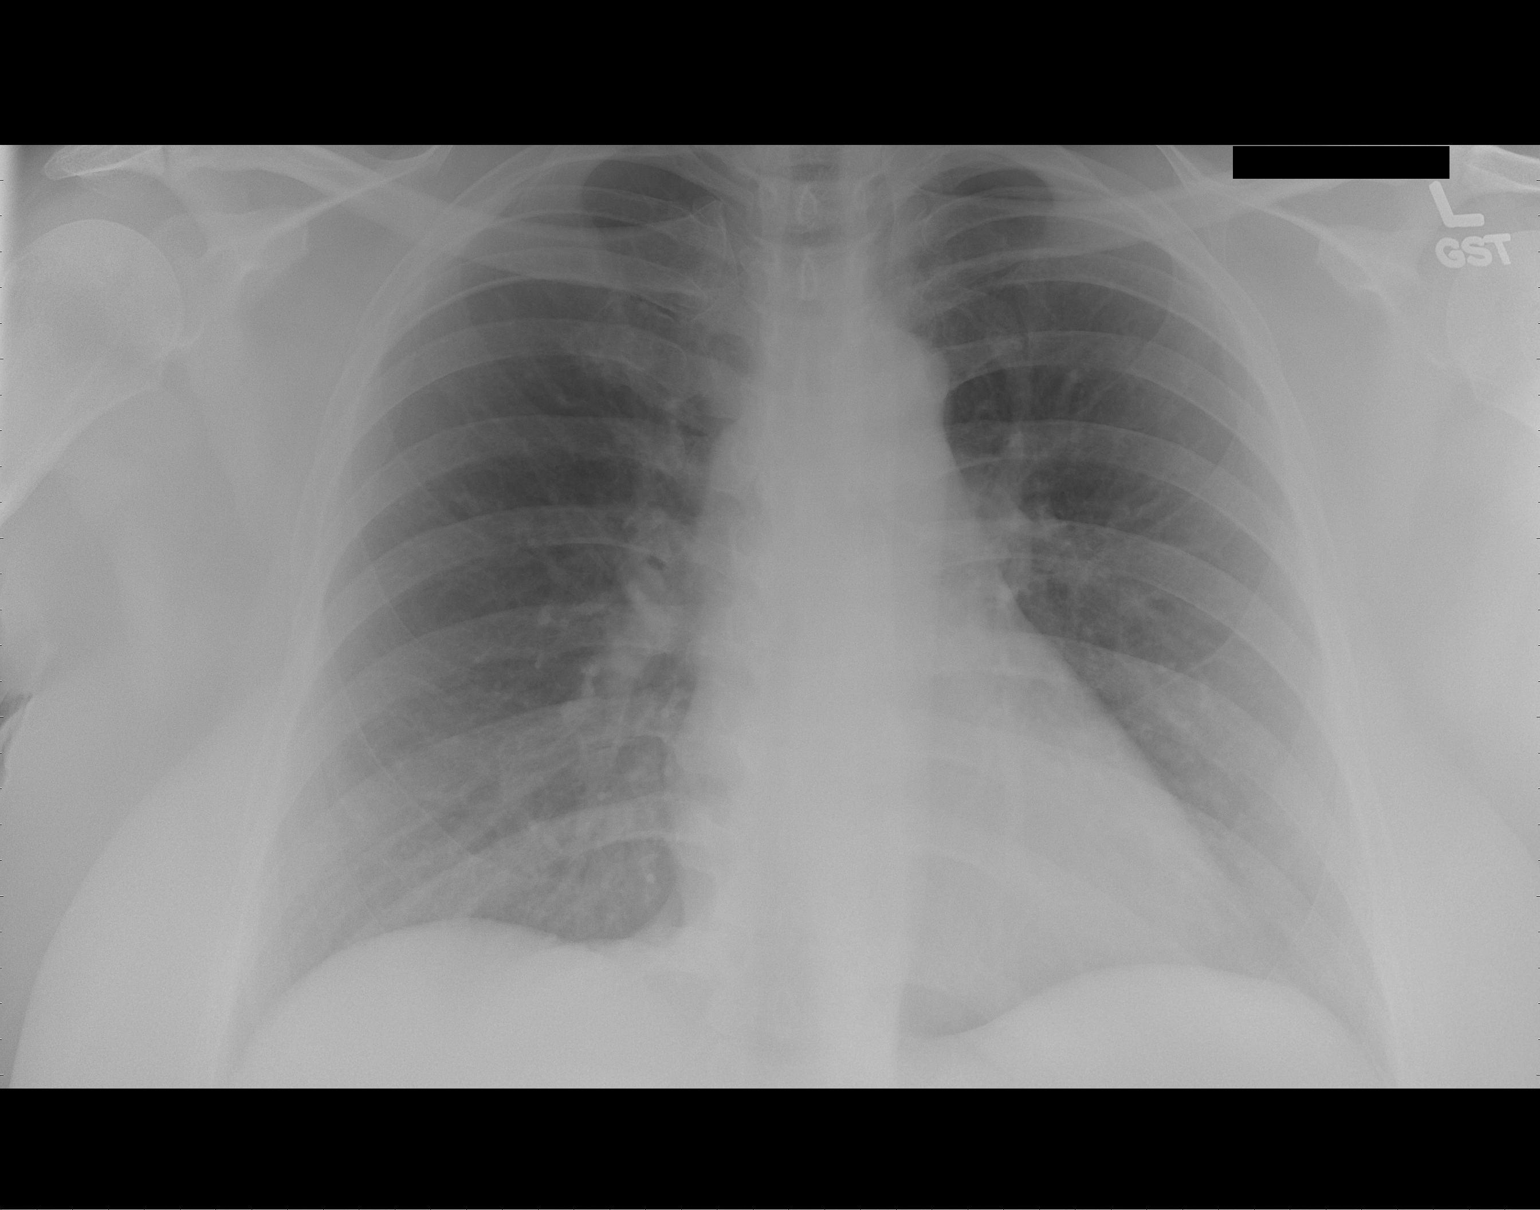
[im 2/3]
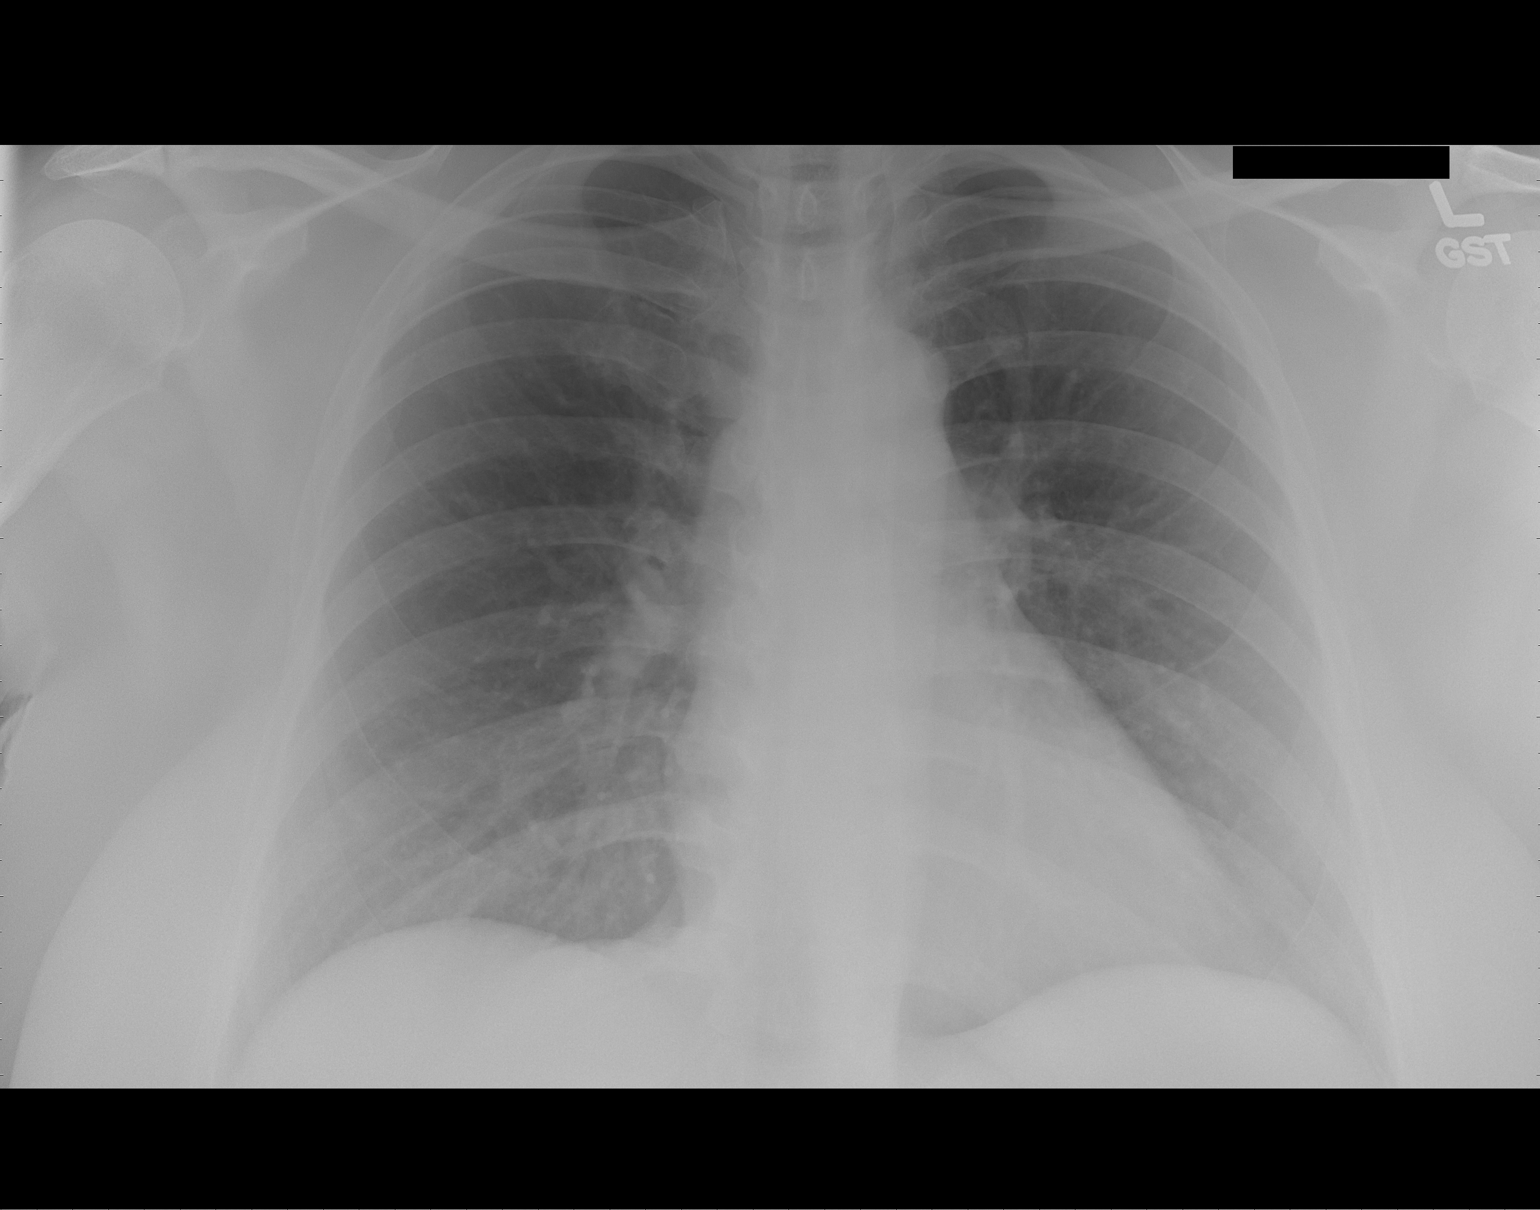
[im 3/3]
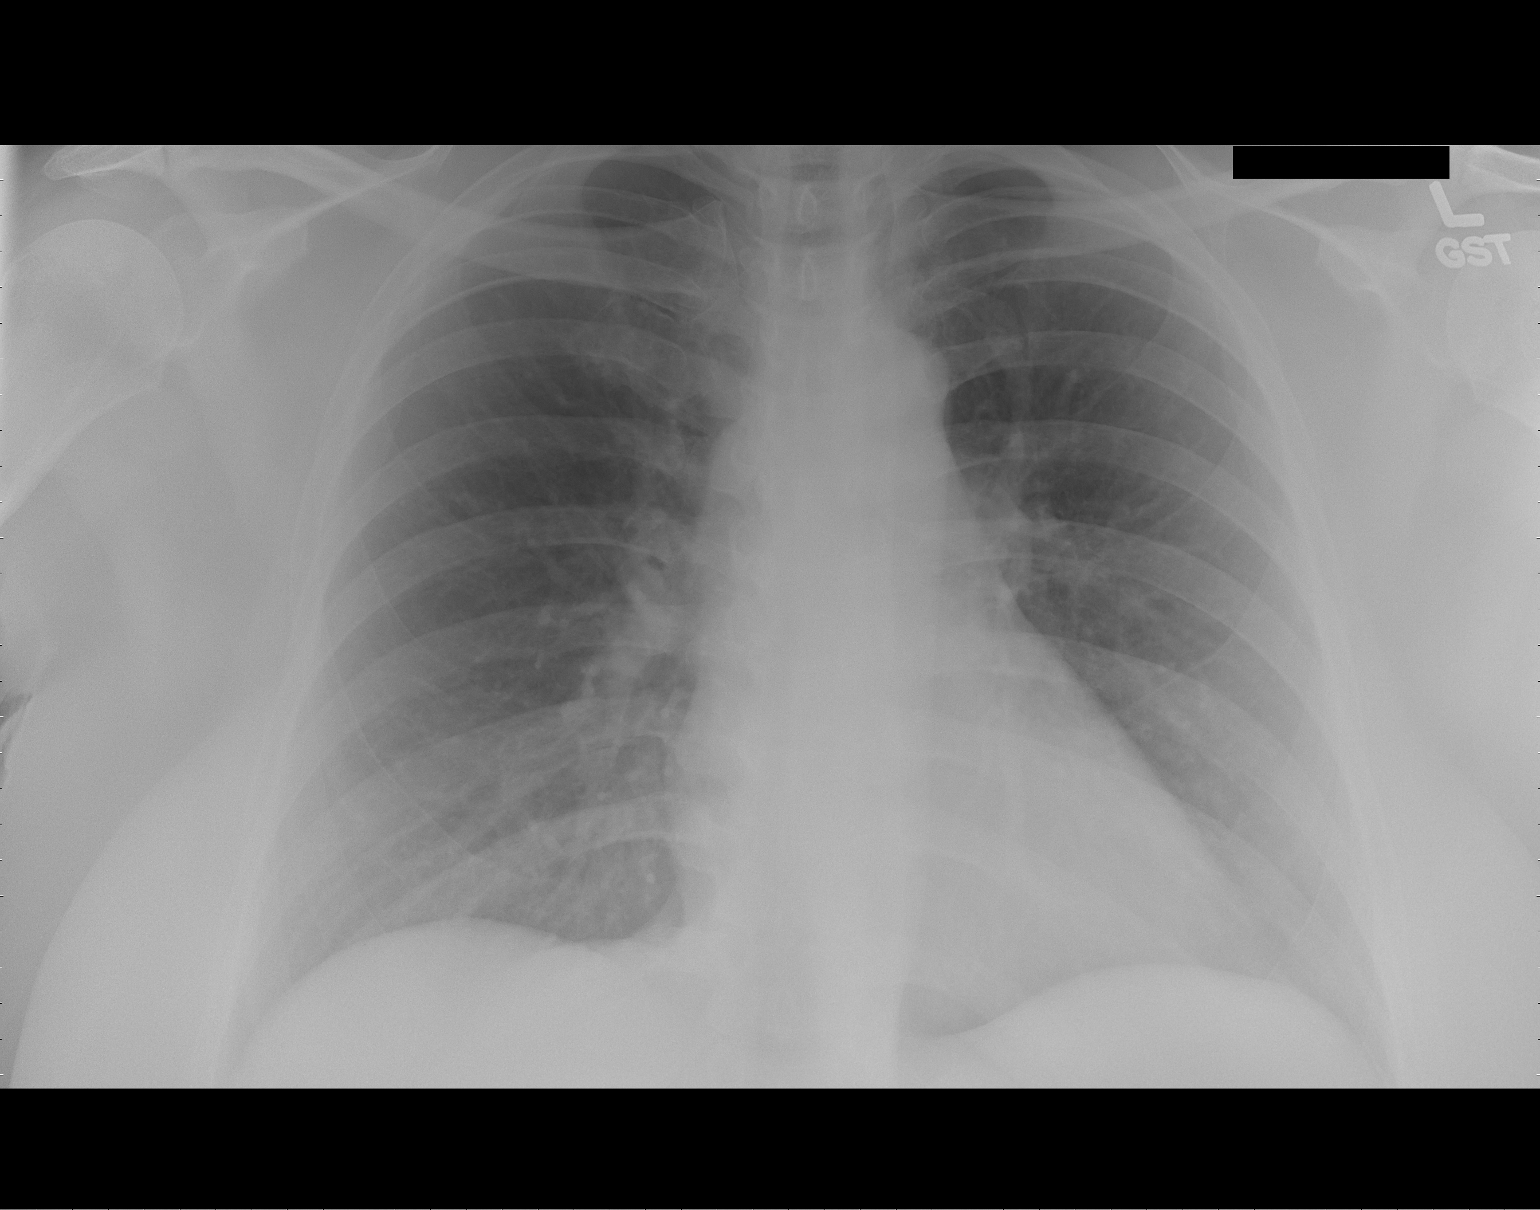

[3 of 3 positions shown; findings below may reference images not displayed]

FINDINGS: The cardiomediastinal silhouette is unremarkable.
The lungs are clear.
There is no evidence of focal airspace disease, pulmonary edema,
pleural effusion, or pneumothorax.
No acute bony abnormalities identified.
IMPRESSION: No evidence of acute cardiopulmonary disease.

## 2010-04-30 IMAGING — NM NM PULM PERFUSION & VENT (REBREATHING & WASHOUT)
2 series · 12 of 12 positions shown · non-contrast
Comparison: Chest x-ray 05/09/2009

CLINICAL DATA: Chest pain.

NUCLEAR MEDICINE VENTILATION - PERFUSION LUNG SCAN
TECHNIQUE: Wash-in, equilibrium, and wash-out phase ventilation
images were obtained using Re-QSS gas.  Perfusion images were
obtained in multiple projections after intravenous injection of Tc-
99m MAA.
Radiopharmaceuticals:  10.0 mCi Re-QSS gas and 6.6 mCi Ec-XXm MAA.

[vq scan · 2.52mm/px · 6 of 20 frames shown (1 of 2)]
[frame 2/20  full-range]
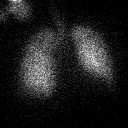
[frame 5/20  full-range]
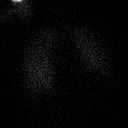
[frame 9/20  full-range]
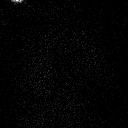
[frame 12/20]
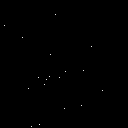
[frame 15/20]
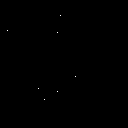
[frame 19/20]
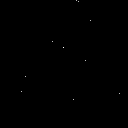

[vq scan · 2.52mm/px · 6 of 20 frames shown (2 of 2)]
[frame 2/20  full-range]
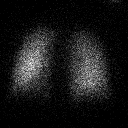
[frame 5/20  full-range]
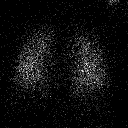
[frame 9/20]
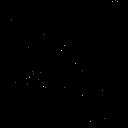
[frame 12/20]
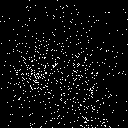
[frame 15/20]
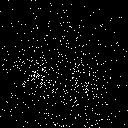
[frame 19/20]
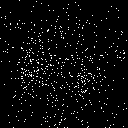

[12 of 12 positions shown; findings below may reference images not displayed]

FINDINGS: The ventilation scan demonstrates delayed washout from
the lungs suggesting air trapping and COPD.  No ventilation defects
are seen.

The perfusion lung scan demonstrates no segmental or subsegmental
perfusion defects to suggest pulmonary emboli.
IMPRESSION: Negative ventilation perfusion lung scan for pulmonary embolism.

## 2010-04-30 IMAGING — CR DG CHEST 1V PORT
1 series · 1 of 1 positions shown · non-contrast
Comparison: 05/08/2009

CLINICAL DATA: Pneumonia

PORTABLE CHEST - 1 VIEW

[AP]
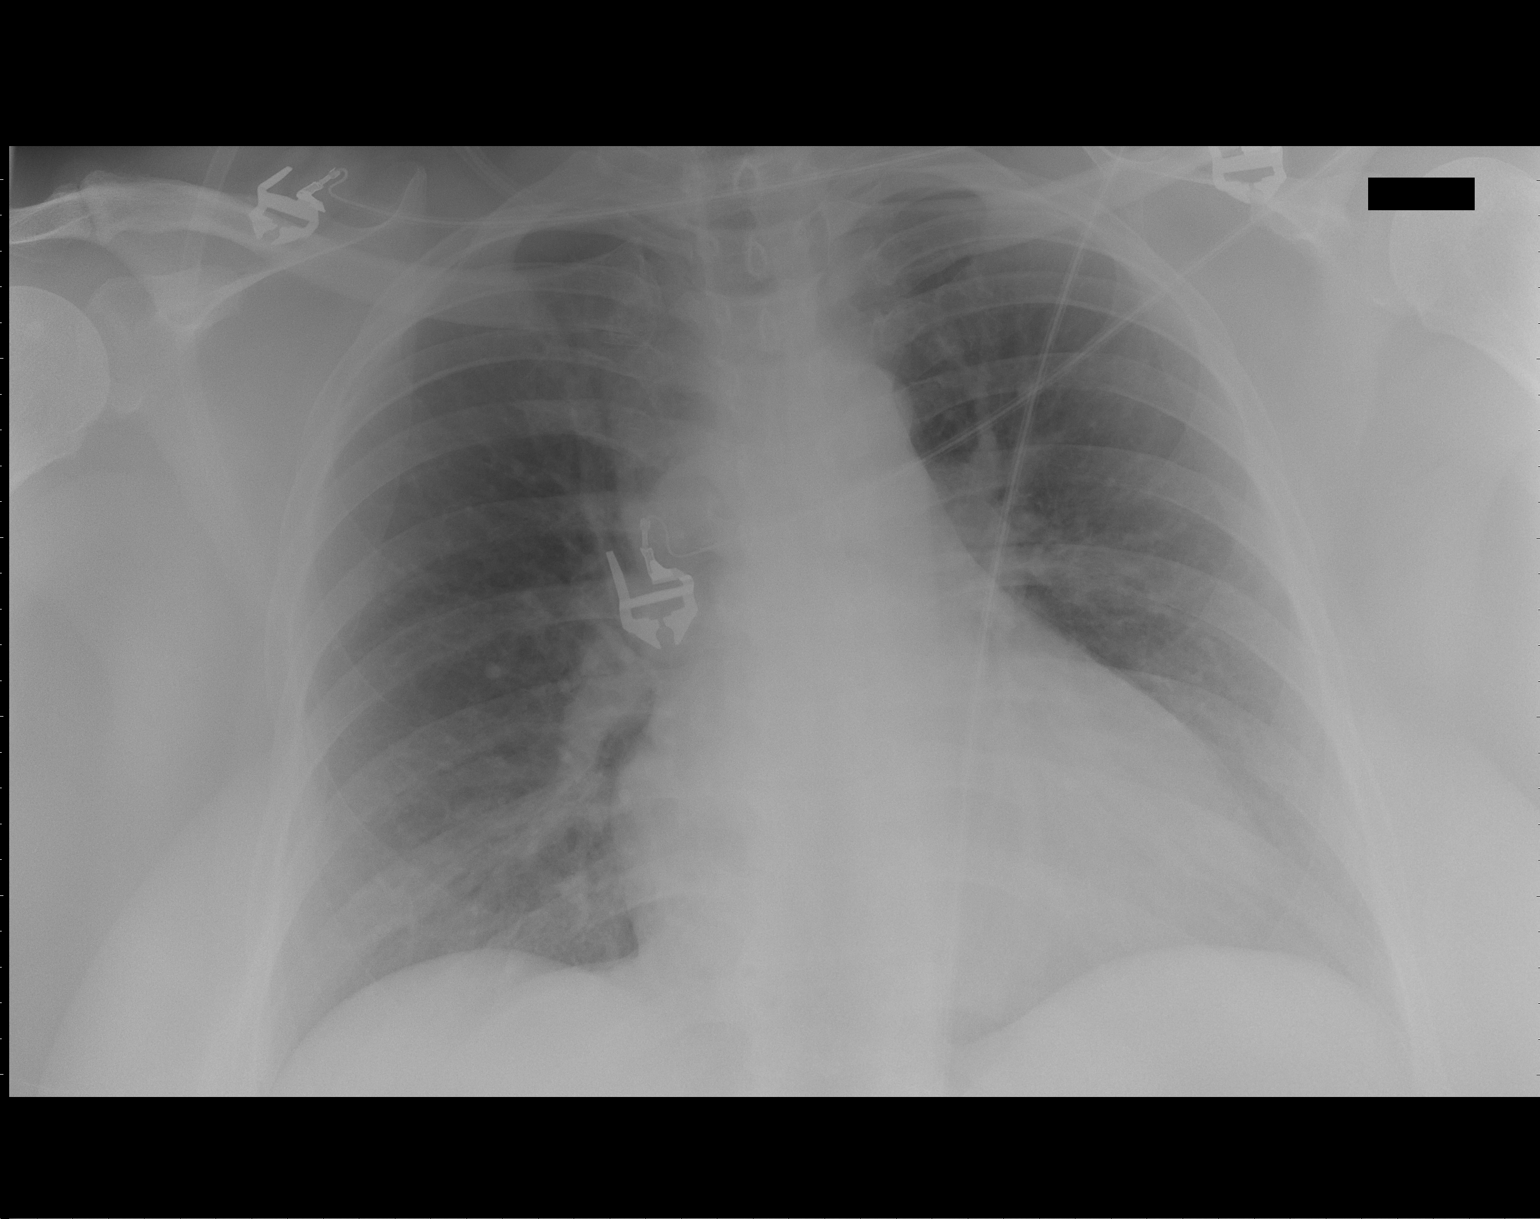

[1 of 1 positions shown; findings below may reference images not displayed]

FINDINGS: No focal airspace disease, definite pneumonia, edema,
consolidation, collapse, effusion or pneumothorax.  Midline
trachea.  Prominent heart size and vascularity related to portable
technique.
IMPRESSION: Stable exam.  No acute chest process

## 2010-05-01 IMAGING — CR DG CHEST 1V PORT
1 series · 1 of 1 positions shown · non-contrast
Comparison: 05/09/2009.

CLINICAL DATA: Pneumonia

PORTABLE CHEST - 1 VIEW

[AP]
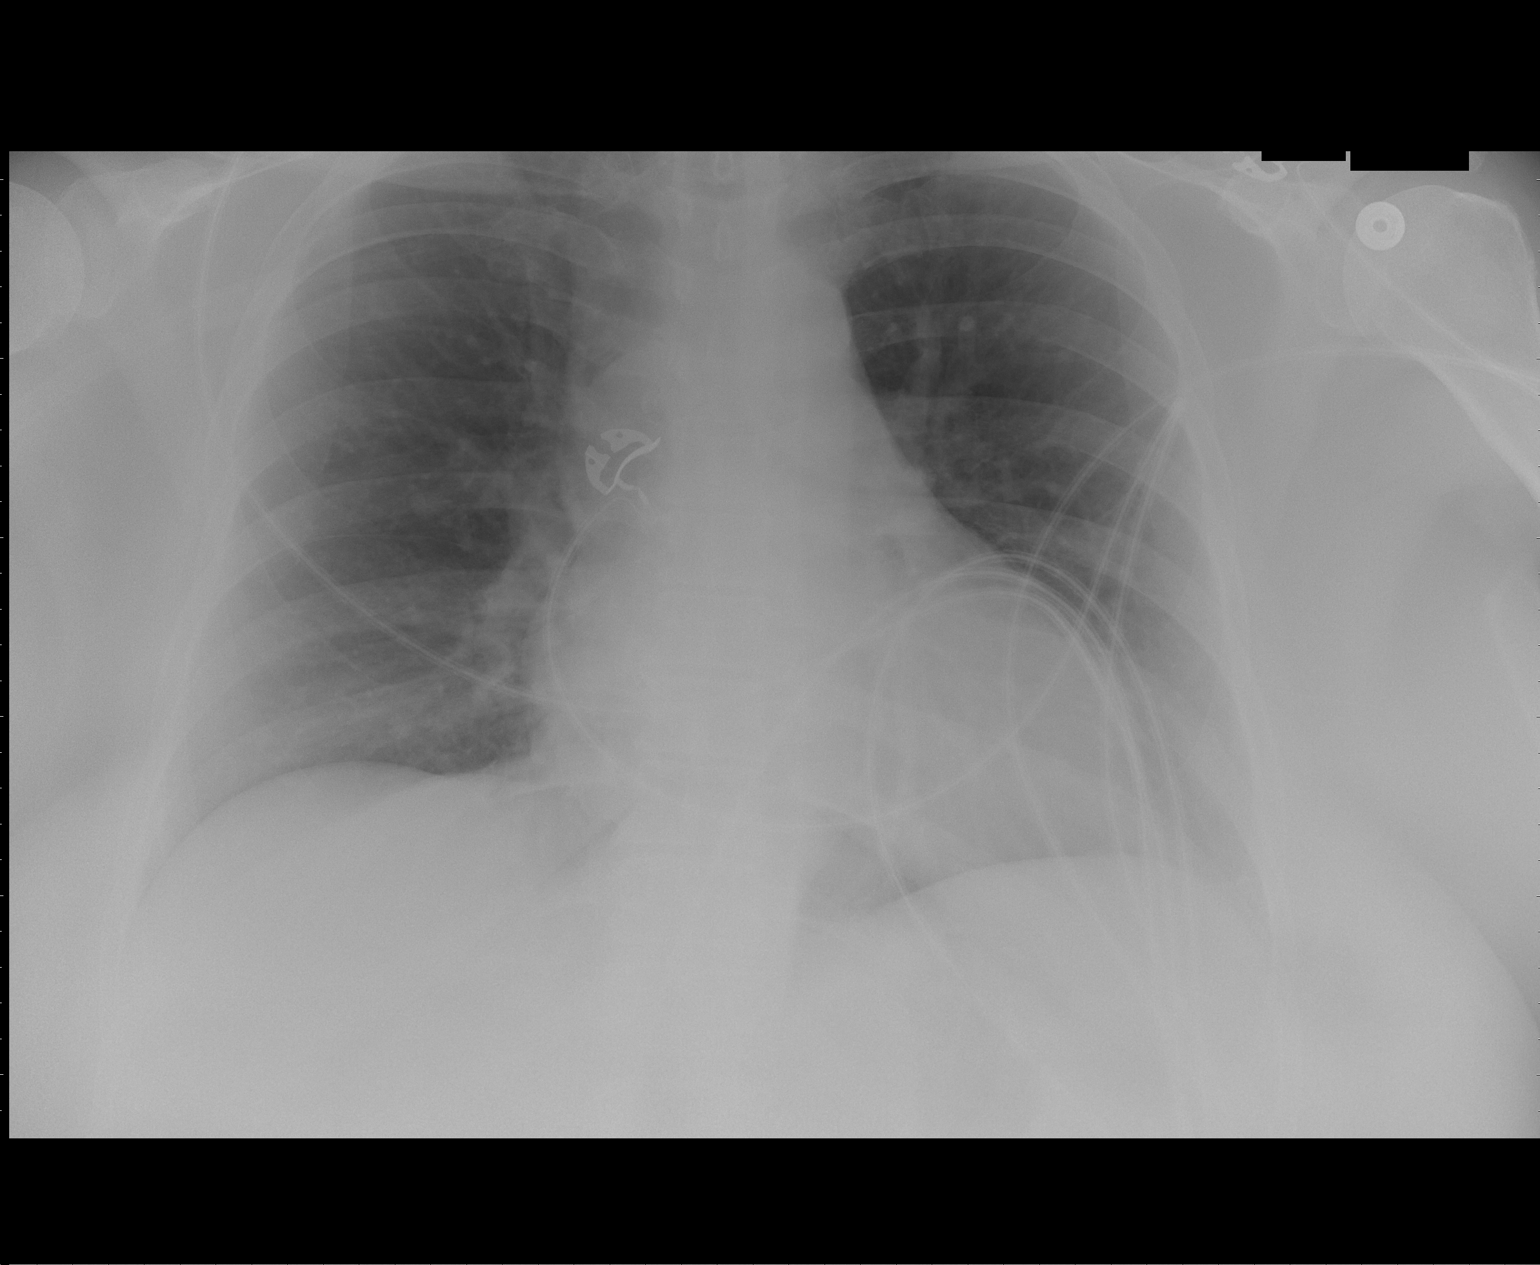

[1 of 1 positions shown; findings below may reference images not displayed]

FINDINGS: 0054 hours. The cardiopericardial silhouette is enlarged.
The lungs are clear without focal infiltrate, edema, pneumothorax
or pleural effusion. Telemetry leads overlie the chest.
IMPRESSION: Stable.  No acute cardiopulmonary findings.

## 2010-05-28 ENCOUNTER — Ambulatory Visit: Payer: Self-pay | Admitting: Oncology

## 2010-06-22 IMAGING — US US SOFT TISSUE HEAD/NECK
1 series · 14 of 25 positions shown · non-contrast
Comparison: [HOSPITAL] thyroid ultrasound 04/20/2006 and
[HOSPITAL] at [REDACTED] [HOSPITAL] thyroid ultrasound 11/02/2006
and 09/22/2007.

CLINICAL DATA: Follow-up thyroid nodules.

THYROID ULTRASOUND
TECHNIQUE: Ultrasound examination of the thyroid gland and
adjacent soft tissues was performed.

[Series 1: us soft tissue head/neck · 0.08mm/px · 14 of 26 slices shown]
[im 1/26]
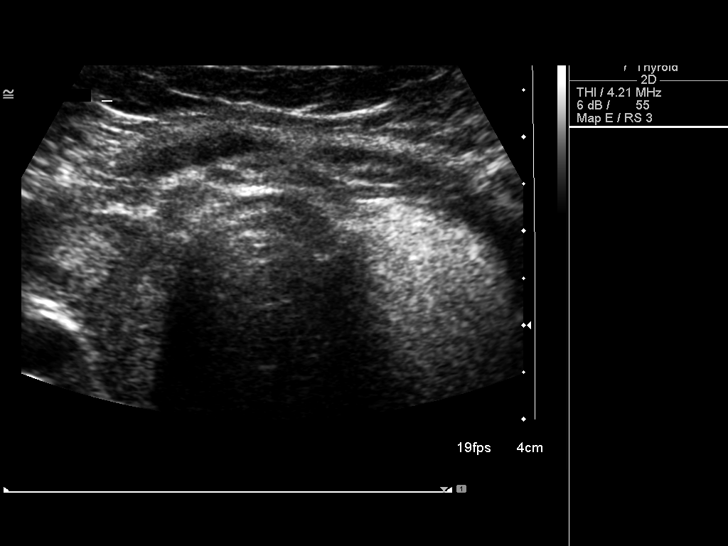
[im 3/26]
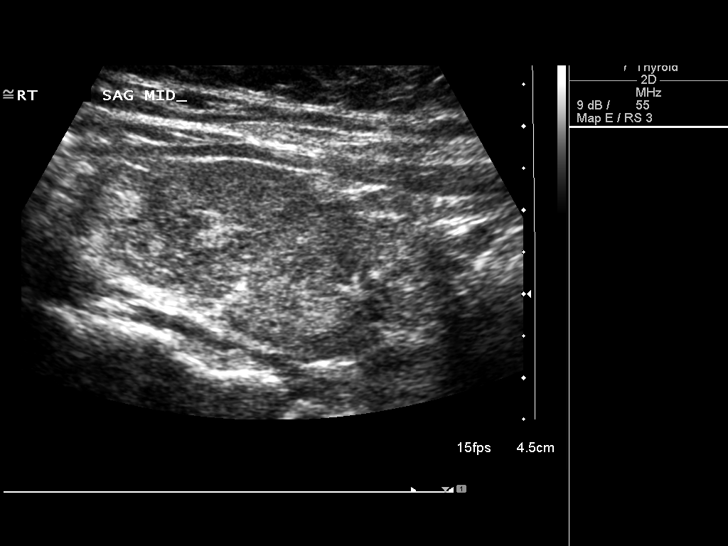
[im 5/26]
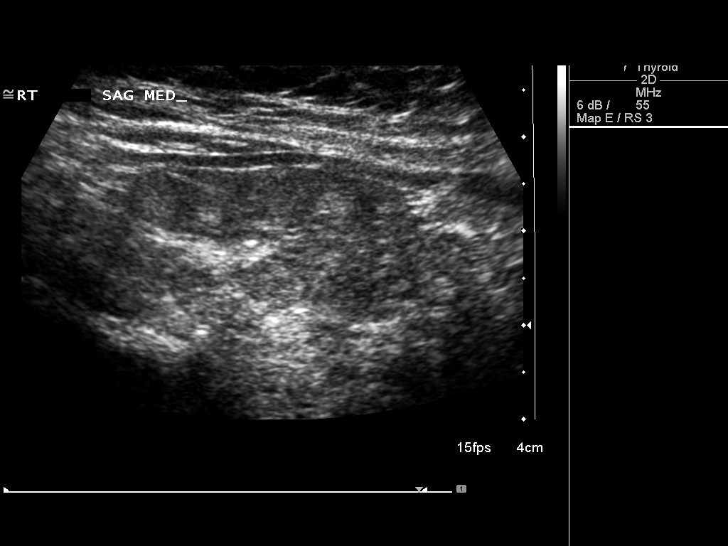
[im 7/26]
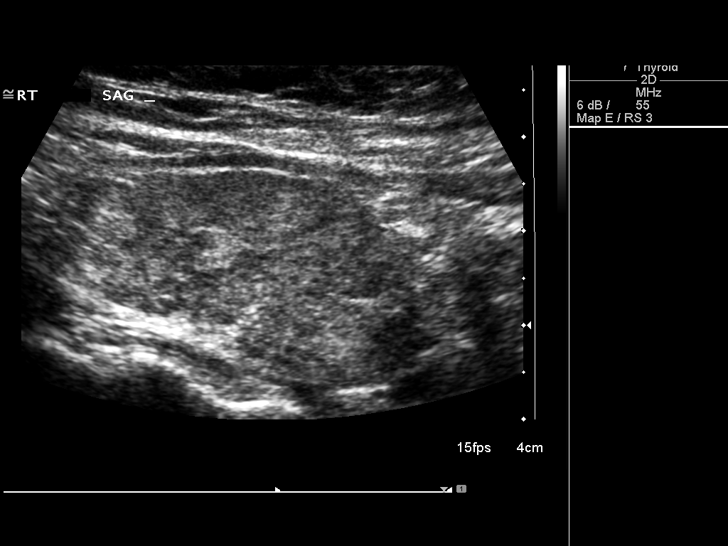
[im 9/26]
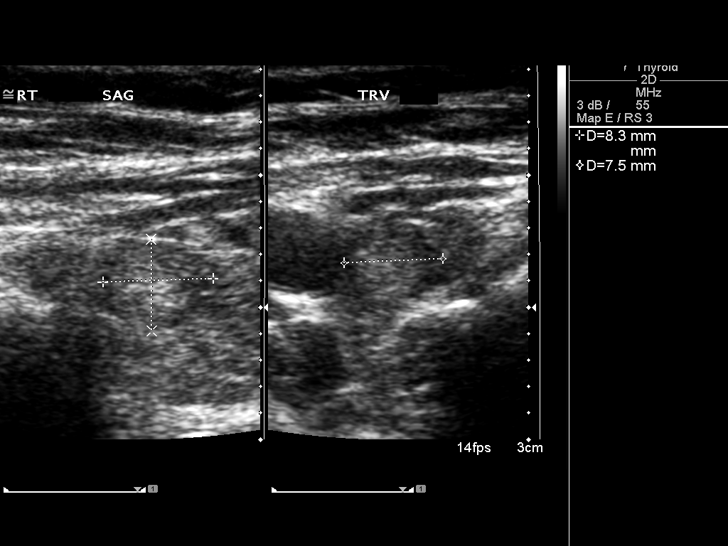
[im 10/26]
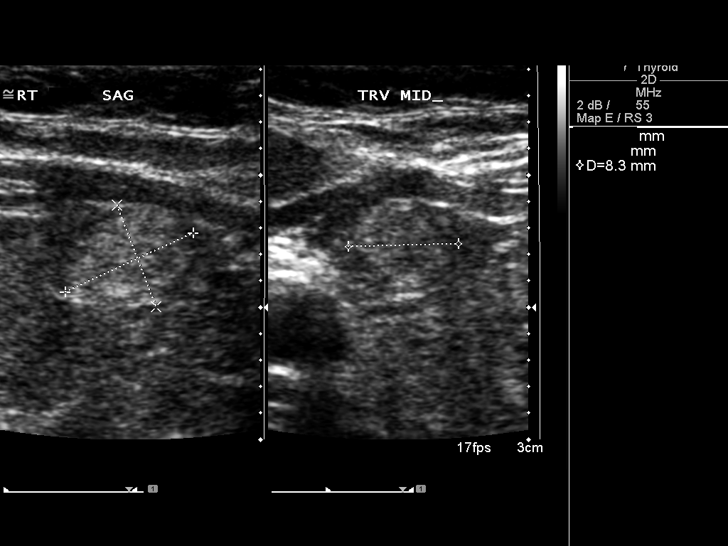
[im 12/26]
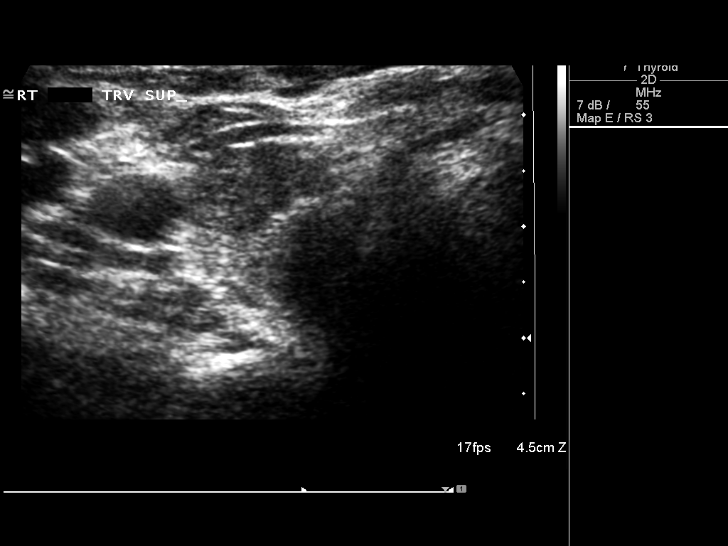
[im 14/26]
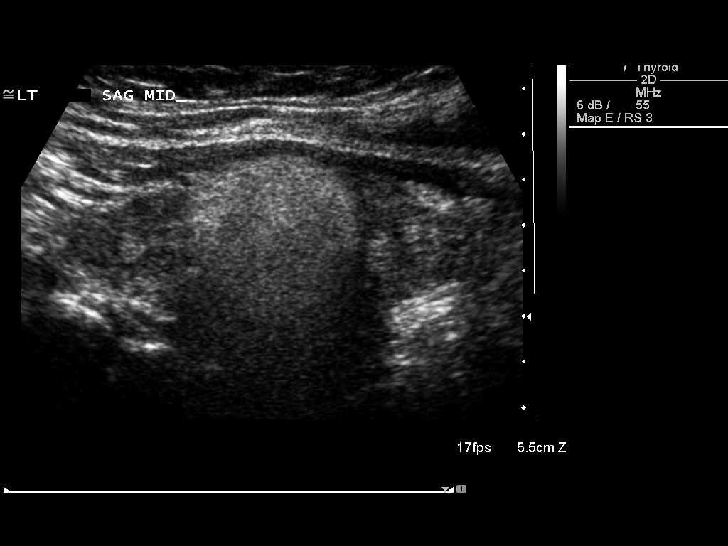
[im 16/26]
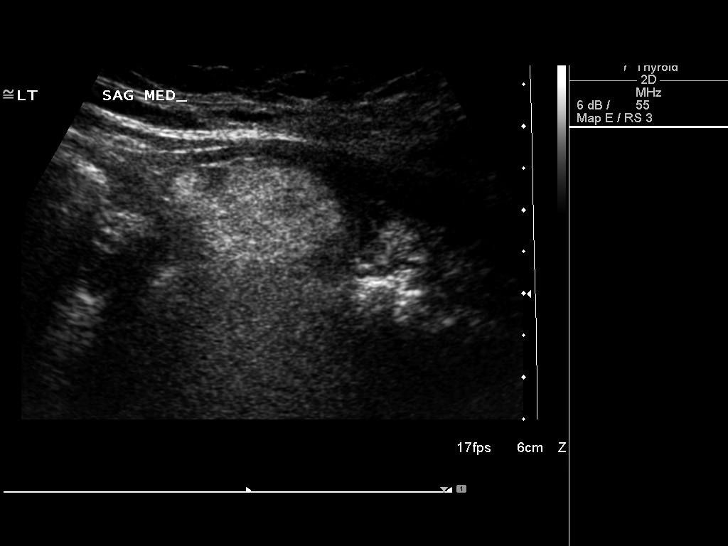
[im 17/26]
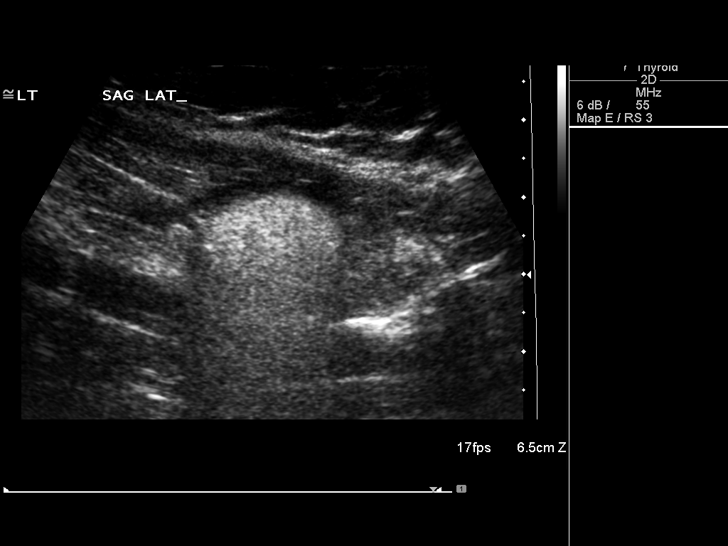
[im 19/26]
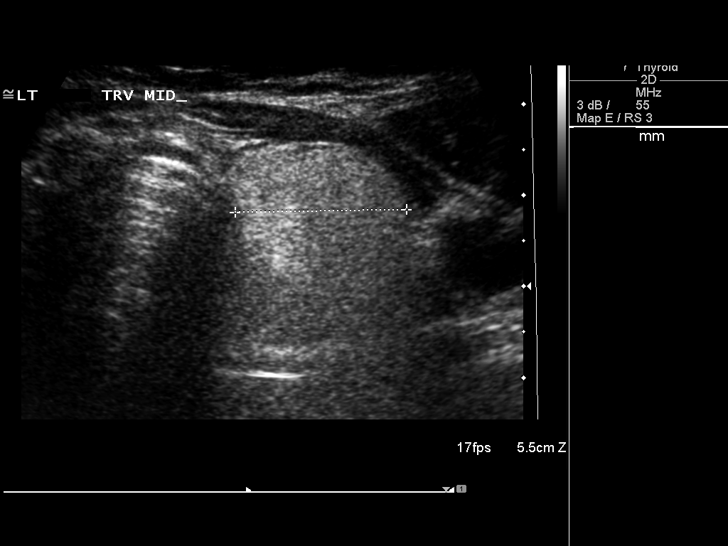
[im 21/26]
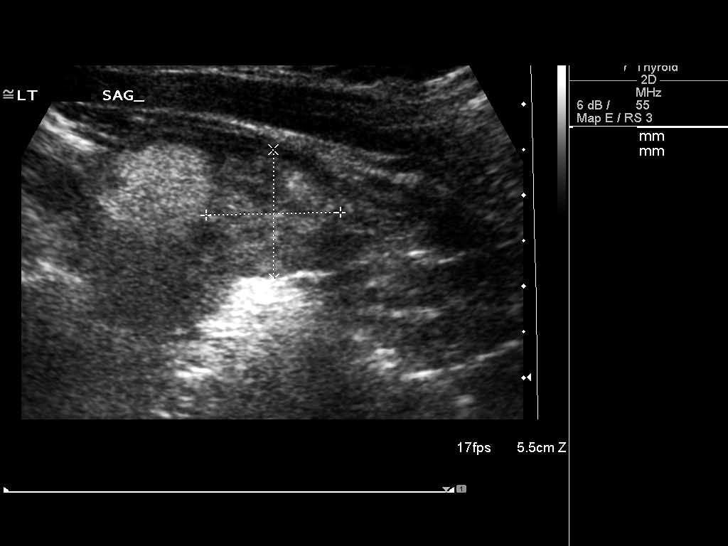
[im 23/26]
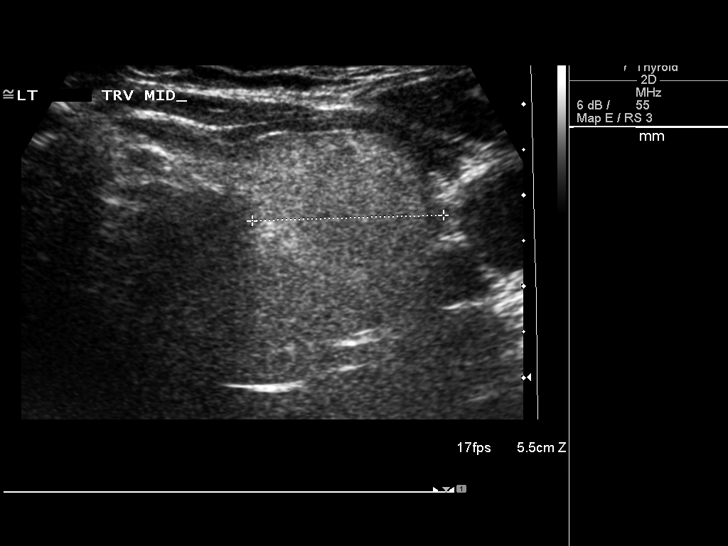
[im 26/26]
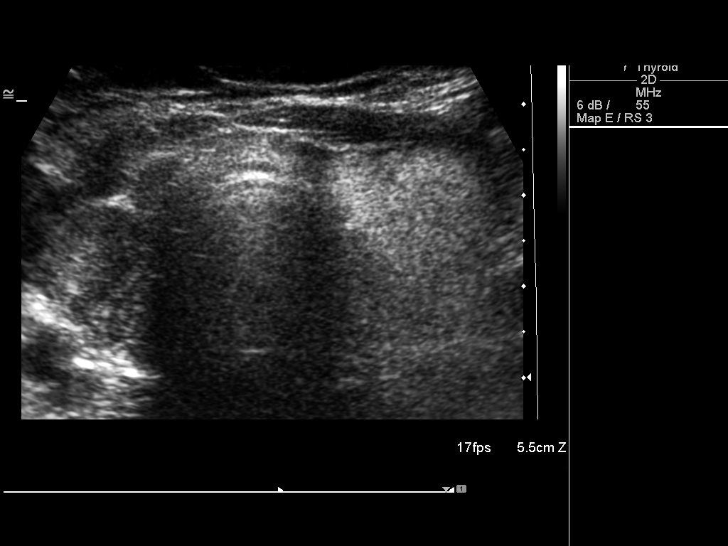

[14 of 25 positions shown; findings below may reference images not displayed]

FINDINGS: Again seen diffusely enlarged heterogeneous thyroid gland
with right lobe measuring 5.1 cm long X 2.2 cm AP X 1.9 cm wide
(09/22/2007 5.7 X 2.2 X 2.6 cm) and left lobe currently measuring
5.5 cm long X 2.1 cm AP X 2.1 cm wide (09/22/2007 5.4 X 1.9 X
cm).  Isthmus again measures 4 mm AP thickness.  Previous slightly
hyper echoic solid nodule at the central left lobe thyroid
currently measures 2.3 cm long X 2.1 cm AP X 1.9 cm wide
(09/22/2007 2.5 X 2.1 X 1.9 cm).  Additional 8 mm upper pole right
to, 1.1 cm mid right, and 1.5 cm lower pole left thyroid nodules
visualized.
IMPRESSION: 1.  Essentially stable enlarged heterogeneous thyroid gland with
stable solids mid left dominant thyroid nodule currently measuring
up to 2.3 cm (09/21/1998 2.5 cm).
2.  No new significant abnormality.

## 2010-06-24 ENCOUNTER — Ambulatory Visit: Payer: Self-pay | Admitting: Oncology

## 2010-06-29 LAB — CBC & DIFF AND RETIC
BASO%: 1 % (ref 0.0–2.0)
HCT: 36.3 % (ref 34.8–46.6)
LYMPH%: 55.7 % — ABNORMAL HIGH (ref 14.0–49.7)
MCHC: 32.5 g/dL (ref 31.5–36.0)
MCV: 85.8 fL (ref 79.5–101.0)
MONO#: 0.5 10*3/uL (ref 0.1–0.9)
MONO%: 17.8 % — ABNORMAL HIGH (ref 0.0–14.0)
NEUT%: 22.4 % — ABNORMAL LOW (ref 38.4–76.8)
Platelets: 304 10*3/uL (ref 145–400)
RBC: 4.23 10*6/uL (ref 3.70–5.45)
WBC: 2.9 10*3/uL — ABNORMAL LOW (ref 3.9–10.3)

## 2010-06-29 LAB — COMPREHENSIVE METABOLIC PANEL
Albumin: 3.6 g/dL (ref 3.5–5.2)
BUN: 12 mg/dL (ref 6–23)
CO2: 30 mEq/L (ref 19–32)
Glucose, Bld: 109 mg/dL — ABNORMAL HIGH (ref 70–99)
Potassium: 3.8 mEq/L (ref 3.5–5.3)
Sodium: 140 mEq/L (ref 135–145)
Total Bilirubin: 0.4 mg/dL (ref 0.3–1.2)
Total Protein: 7.1 g/dL (ref 6.0–8.3)

## 2010-06-29 LAB — LACTATE DEHYDROGENASE: LDH: 142 U/L (ref 94–250)

## 2010-06-29 LAB — MORPHOLOGY

## 2010-06-29 LAB — URIC ACID: Uric Acid, Serum: 7.3 mg/dL — ABNORMAL HIGH (ref 2.4–7.0)

## 2010-08-18 ENCOUNTER — Encounter
Admission: RE | Admit: 2010-08-18 | Discharge: 2010-08-18 | Payer: Self-pay | Source: Home / Self Care | Attending: Surgery | Admitting: Surgery

## 2010-08-27 ENCOUNTER — Ambulatory Visit: Payer: Self-pay | Admitting: Oncology

## 2010-08-31 LAB — CBC WITH DIFFERENTIAL/PLATELET
Eosinophils Absolute: 0.1 10*3/uL (ref 0.0–0.5)
LYMPH%: 61.9 % — ABNORMAL HIGH (ref 14.0–49.7)
MONO#: 0.5 10*3/uL (ref 0.1–0.9)
NEUT#: 0.3 10*3/uL — CL (ref 1.5–6.5)
Platelets: 292 10*3/uL (ref 145–400)
RBC: 4.16 10*6/uL (ref 3.70–5.45)
WBC: 2.6 10*3/uL — ABNORMAL LOW (ref 3.9–10.3)

## 2010-08-31 LAB — CHCC SMEAR

## 2010-08-31 LAB — MORPHOLOGY: PLT EST: ADEQUATE

## 2010-10-15 LAB — DIFFERENTIAL
Basophils Relative: 1 % (ref 0–1)
Eosinophils Relative: 1 % (ref 0–5)
Lymphs Abs: 1.2 10*3/uL (ref 0.7–4.0)
Monocytes Absolute: 0.8 10*3/uL (ref 0.1–1.0)
Neutro Abs: 1 10*3/uL — ABNORMAL LOW (ref 1.7–7.7)
Neutrophils Relative %: 33 % — ABNORMAL LOW (ref 43–77)

## 2010-10-15 LAB — CBC
MCHC: 32.5 g/dL (ref 30.0–36.0)
Platelets: 253 10*3/uL (ref 150–400)
RDW: 15.8 % — ABNORMAL HIGH (ref 11.5–15.5)
WBC: 3 10*3/uL — ABNORMAL LOW (ref 4.0–10.5)

## 2010-10-15 LAB — CULTURE, BLOOD (ROUTINE X 2): Culture  Setup Time: 201109190828

## 2010-10-15 LAB — COMPREHENSIVE METABOLIC PANEL
ALT: 22 U/L (ref 0–35)
AST: 18 U/L (ref 0–37)
Albumin: 3.5 g/dL (ref 3.5–5.2)
Calcium: 9.3 mg/dL (ref 8.4–10.5)
GFR calc Af Amer: >60 mL/min (ref >60)
Sodium: 137 mEq/L (ref 135–145)
Total Protein: 7.7 g/dL (ref 6.0–8.3)

## 2010-10-15 LAB — URINALYSIS, ROUTINE W REFLEX MICROSCOPIC
Bilirubin Urine: NEGATIVE
Glucose, UA: NEGATIVE mg/dL
Nitrite: NEGATIVE
Specific Gravity, Urine: 1.02 (ref 1.005–1.030)
pH: 7 (ref 5.0–8.0)

## 2010-10-16 ENCOUNTER — Other Ambulatory Visit: Payer: Self-pay | Admitting: Oncology

## 2010-10-16 ENCOUNTER — Encounter (HOSPITAL_BASED_OUTPATIENT_CLINIC_OR_DEPARTMENT_OTHER): Payer: 59 | Admitting: Oncology

## 2010-10-16 DIAGNOSIS — D709 Neutropenia, unspecified: Secondary | ICD-10-CM

## 2010-10-16 DIAGNOSIS — C569 Malignant neoplasm of unspecified ovary: Secondary | ICD-10-CM

## 2010-10-16 DIAGNOSIS — D72819 Decreased white blood cell count, unspecified: Secondary | ICD-10-CM

## 2010-10-16 DIAGNOSIS — D649 Anemia, unspecified: Secondary | ICD-10-CM

## 2010-10-16 DIAGNOSIS — D702 Other drug-induced agranulocytosis: Secondary | ICD-10-CM

## 2010-10-16 LAB — CBC & DIFF AND RETIC
Eosinophils Absolute: 0.1 10*3/uL (ref 0.0–0.5)
Immature Retic Fract: 9.7 % (ref 0.00–10.70)
MONO#: 0.4 10*3/uL (ref 0.1–0.9)
NEUT#: 0.2 10*3/uL — CL (ref 1.5–6.5)
Platelets: 271 10*3/uL (ref 145–400)
RBC: 4.18 10*6/uL (ref 3.70–5.45)
RDW: 15.2 % — ABNORMAL HIGH (ref 11.2–14.5)
Retic %: 1.32 % (ref 0.50–1.50)
Retic Ct Abs: 55.18 10*3/uL (ref 18.30–72.70)
WBC: 2.6 10*3/uL — ABNORMAL LOW (ref 3.9–10.3)
nRBC: 0 % (ref 0–0)

## 2010-10-16 LAB — GLUCOSE, CAPILLARY: Glucose-Capillary: 104 mg/dL — ABNORMAL HIGH (ref 70–99)

## 2010-10-16 LAB — MORPHOLOGY

## 2010-11-05 LAB — BASIC METABOLIC PANEL
BUN: 12 mg/dL (ref 6–23)
BUN: 15 mg/dL (ref 6–23)
CO2: 26 mEq/L (ref 19–32)
Chloride: 104 mEq/L (ref 96–112)
Chloride: 104 mEq/L (ref 96–112)
GFR calc Af Amer: >60 mL/min (ref >60)
GFR calc non Af Amer: >60 mL/min (ref >60)
Glucose, Bld: 123 mg/dL — ABNORMAL HIGH (ref 70–99)
Potassium: 3.1 mEq/L — ABNORMAL LOW (ref 3.5–5.1)
Potassium: 3.2 mEq/L — ABNORMAL LOW (ref 3.5–5.1)
Potassium: 4.3 mEq/L (ref 3.5–5.1)
Sodium: 140 mEq/L (ref 135–145)

## 2010-11-05 LAB — GLUCOSE, CAPILLARY
Glucose-Capillary: 102 mg/dL — ABNORMAL HIGH (ref 70–99)
Glucose-Capillary: 106 mg/dL — ABNORMAL HIGH (ref 70–99)
Glucose-Capillary: 111 mg/dL — ABNORMAL HIGH (ref 70–99)
Glucose-Capillary: 123 mg/dL — ABNORMAL HIGH (ref 70–99)
Glucose-Capillary: 148 mg/dL — ABNORMAL HIGH (ref 70–99)
Glucose-Capillary: 86 mg/dL (ref 70–99)
Glucose-Capillary: 88 mg/dL (ref 70–99)
Glucose-Capillary: 90 mg/dL (ref 70–99)

## 2010-11-05 LAB — DIFFERENTIAL
Basophils Absolute: 0 10*3/uL (ref 0.0–0.1)
Basophils Absolute: 0 10*3/uL (ref 0.0–0.1)
Basophils Absolute: 0.1 10*3/uL (ref 0.0–0.1)
Basophils Relative: 1 % (ref 0–1)
Basophils Relative: 1 % (ref 0–1)
Eosinophils Absolute: 0 10*3/uL (ref 0.0–0.7)
Eosinophils Absolute: 0 10*3/uL (ref 0.0–0.7)
Eosinophils Absolute: 0 10*3/uL (ref 0.0–0.7)
Eosinophils Relative: 0 % (ref 0–5)
Lymphocytes Relative: 32 % (ref 12–46)
Lymphocytes Relative: 49 % — ABNORMAL HIGH (ref 12–46)
Lymphs Abs: 0.8 10*3/uL (ref 0.7–4.0)
Monocytes Absolute: 0.5 10*3/uL (ref 0.1–1.0)
Neutrophils Relative %: 25 % — ABNORMAL LOW (ref 43–77)
Neutrophils Relative %: 80 % — ABNORMAL HIGH (ref 43–77)
Smear Review: ADEQUATE
WBC Morphology: INCREASED

## 2010-11-05 LAB — CBC
HCT: 27.5 % — ABNORMAL LOW (ref 36.0–46.0)
HCT: 27.6 % — ABNORMAL LOW (ref 36.0–46.0)
HCT: 27.8 % — ABNORMAL LOW (ref 36.0–46.0)
HCT: 27.9 % — ABNORMAL LOW (ref 36.0–46.0)
Hemoglobin: 11.2 g/dL — ABNORMAL LOW (ref 12.0–15.0)
Hemoglobin: 9.6 g/dL — ABNORMAL LOW (ref 12.0–15.0)
MCHC: 34.2 g/dL (ref 30.0–36.0)
MCHC: 34.4 g/dL (ref 30.0–36.0)
MCV: 89.1 fL (ref 78.0–100.0)
MCV: 89.1 fL (ref 78.0–100.0)
MCV: 89.5 fL (ref 78.0–100.0)
MCV: 90.3 fL (ref 78.0–100.0)
Platelets: 190 10*3/uL (ref 150–400)
RBC: 3.07 MIL/uL — ABNORMAL LOW (ref 3.87–5.11)
RBC: 3.08 MIL/uL — ABNORMAL LOW (ref 3.87–5.11)
RBC: 3.65 MIL/uL — ABNORMAL LOW (ref 3.87–5.11)
RDW: 16.4 % — ABNORMAL HIGH (ref 11.5–15.5)
WBC: 1.6 10*3/uL — ABNORMAL LOW (ref 4.0–10.5)
WBC: 1.9 10*3/uL — ABNORMAL LOW (ref 4.0–10.5)
WBC: 3.6 10*3/uL — ABNORMAL LOW (ref 4.0–10.5)

## 2010-11-05 LAB — COMPREHENSIVE METABOLIC PANEL
ALT: 31 U/L (ref 0–35)
Alkaline Phosphatase: 50 U/L (ref 39–117)
BUN: 21 mg/dL (ref 6–23)
CO2: 24 mEq/L (ref 19–32)
CO2: 25 mEq/L (ref 19–32)
Calcium: 8.8 mg/dL (ref 8.4–10.5)
Chloride: 109 mEq/L (ref 96–112)
Creatinine, Ser: 1.37 mg/dL — ABNORMAL HIGH (ref 0.4–1.2)
Creatinine, Ser: 1.86 mg/dL — ABNORMAL HIGH (ref 0.4–1.2)
GFR calc non Af Amer: 28 mL/min — ABNORMAL LOW (ref >60)
GFR calc non Af Amer: 40 mL/min — ABNORMAL LOW (ref >60)
Glucose, Bld: 162 mg/dL — ABNORMAL HIGH (ref 70–99)
Glucose, Bld: 96 mg/dL (ref 70–99)
Potassium: 3.6 mEq/L (ref 3.5–5.1)
Sodium: 134 mEq/L — ABNORMAL LOW (ref 135–145)
Total Bilirubin: 0.5 mg/dL (ref 0.3–1.2)
Total Bilirubin: 0.6 mg/dL (ref 0.3–1.2)

## 2010-11-05 LAB — URINALYSIS, ROUTINE W REFLEX MICROSCOPIC
Glucose, UA: NEGATIVE mg/dL
Hgb urine dipstick: NEGATIVE
Ketones, ur: 15 mg/dL — AB
Leukocytes, UA: NEGATIVE
Protein, ur: 30 mg/dL — AB
pH: 5 (ref 5.0–8.0)

## 2010-11-05 LAB — APTT: aPTT: 27 seconds (ref 24–37)

## 2010-11-05 LAB — CREATININE, URINE, RANDOM: Creatinine, Urine: 285.8 mg/dL

## 2010-11-05 LAB — POCT I-STAT, CHEM 8
BUN: 25 mg/dL — ABNORMAL HIGH (ref 6–23)
Calcium, Ion: 1.01 mmol/L — ABNORMAL LOW (ref 1.12–1.32)
Chloride: 101 mEq/L (ref 96–112)
Creatinine, Ser: 1.9 mg/dL — ABNORMAL HIGH (ref 0.4–1.2)
TCO2: 24 mmol/L (ref 0–100)

## 2010-11-05 LAB — URINE MICROSCOPIC-ADD ON

## 2010-11-05 LAB — LIPID PANEL
HDL: 39 mg/dL — ABNORMAL LOW (ref >39)
Total CHOL/HDL Ratio: 1.9 RATIO
Triglycerides: 44 mg/dL (ref <150)

## 2010-11-05 LAB — EXPECTORATED SPUTUM ASSESSMENT W GRAM STAIN, RFLX TO RESP C

## 2010-11-05 LAB — CULTURE, BLOOD (ROUTINE X 2): Culture: NO GROWTH

## 2010-11-05 LAB — URINE CULTURE

## 2010-12-15 NOTE — Op Note (Signed)
NAME:  REMII, [name] NO.:  [id number]   MEDICAL RECORD NO.:  [id number]          PATIENT TYPE:  AMB   LOCATION:  ENDO                         FACILITY:  [facility]   PHYSICIAN:  Ronald Lobo, M.D.   DATE OF BIRTH:  1956-05-30   DATE OF PROCEDURE:  04/13/2007  DATE OF DISCHARGE:                               OPERATIVE REPORT   PROCEDURE:  Colonoscopy with biopsy.   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   FINDINGS:  Diminutive rectal polyp.   DESCRIPTION OF PROCEDURE:  The nature, purpose and risks of the  procedure were familiar to the patient from prior examination.  She  provided written consent.  The initial IV did not work properly and had  to be changed.  By that time, she had already received a fair amount of  sedative medication, most of which probably went subcutaneously.  Total  sedation administered for this procedure was thus probably artificially  elevated, but for what it is worth it totaled fentanyl 175 mcg and  Versed 9 mg IV.  The patient was comfortable and just mildly sedated  throughout the procedure.  She was clinically stable throughout.   The Pentax adult video colonoscope was quite easily advanced to the  terminal ileum which had a normal appearance and pullback was then  performed.  The quality of the prep was excellent and it is felt that  all areas were well seen.   In the rectum was a 2 mm sessile polyp removed by several cold biopsies  to a point of complete excision.  No other polyps were seen and there  was no evidence of other abnormalities such as cancer, colitis, vascular  ectasia or diverticulosis.  Retroflexion in the rectum and reinspection  of the rectum were, otherwise, unremarkable.  The perianal area and the  anal  canal were normal despite the fact the patient has seen a little  bit of blood when she wiped this morning after her prep and she does see  it occasionally at home.  This is presumably due to either transient  anal fissuring, perianal excoriation, or transient anal fissure, but no  significant internal hemorrhoids were seen.   The patient tolerated this procedure well and there were no apparent  complications.   IMPRESSION:  1. Solitary diminutive rectal polyp removed as described above.  2. Otherwise, normal screening colonoscopy in a patient with a past      history of ovarian cancer.   PLAN:  Await pathology results, anticipate follow up colonoscopy in five  years for ongoing screening regardless of the polyp histology.           ______________________________  Ronald Lobo, M.D.     RB/MEDQ  D:  04/13/2007  T:  04/13/2007  Job:  [id number]   cc:   [name]  Fax: [phone]   Juanda Bond. Al[place], M.D.  Fax: [phone]

## 2010-12-18 NOTE — Op Note (Signed)
[name], [name]                ACCOUNT NO.:  [id number]   MEDICAL RECORD NO.:  [id number]          PATIENT TYPE:  AMB   LOCATION:  ENDO                         FACILITY:  [facility]   PHYSICIAN:  Ronald Lobo, M.D.   DATE OF BIRTH:  10/11/1955   DATE OF PROCEDURE:  06/07/2005  DATE OF DISCHARGE:                                 OPERATIVE REPORT   PROCEDURE:  Colonoscopy.   INDICATIONS:  [name] is a 55 year old female roughly 6 months status post  TAH/BSO for endometrial and ovarian cancer followed by chemotherapy which  she has now completed. She has no worrisome symptoms relative to the GI  tract nor any family history of colon cancer or colon polyps.   FINDINGS:  Normal exam to the terminal ileum.   DESCRIPTION OF PROCEDURE:  The nature, purpose and risks of the procedure  had been discussed with the patient who provided written consent. Sedation  was fentanyl 75 mcg and Versed 9 mg IV without arrhythmias or desaturation.  The Olympus adult video colonoscope was advanced without difficulty to the  terminal ileum which had a normal appearance and pullback was then  performed. The quality of the prep was excellent and it is felt that all  areas were well seen.   This was a normal examination. No polyps, cancer, colitis, vascular  malformations or diverticulosis were noted and retroflexion in the rectum  and reinspection of the rectum were normal. No biopsies were obtained. The  patient tolerated the procedure well and there were no apparent  complications.   IMPRESSION:  Normal screening colonoscopy in a patient with a history of  endometrial and ovarian cancer.   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           ______________________________  Ronald Lobo, M.D.     RB/MEDQ  D:  06/07/2005  T:  06/07/2005  Job:  [id number]   cc:   Juanda Bond. Altheimer, M.D.  Fax: [phone]   Reginia Forts, M.D.  Fax: [place]  Fax: [phone]

## 2010-12-18 NOTE — Op Note (Signed)
   NAME:  [name], [name]                          ACCOUNT NO.:  [id number]   MEDICAL RECORD NO.:  [id number]                   PATIENT TYPE:  AMB   LOCATION:  ENDO                                 FACILITY:  [facility]   PHYSICIAN:  Ronald Lobo, M.D.                DATE OF BIRTH:  July 04, 1956   DATE OF PROCEDURE:  09/13/2002  DATE OF DISCHARGE:                                 OPERATIVE REPORT   PROCEDURE:  Upper endoscopy.   ENDOSCOPIST:  Ronald Lobo, M.D.   INDICATION:  Intensified reflux symptoms in a 55 year old female.  The  symptoms do seem to have improved somewhat on a higher dose of Prilosec, 20  mg b.i.d. (OTC), albeit at the expense of a side-effects of lower GI  symptoms with urgency of defecation.   FINDINGS:  Small hiatal hernia with minimal esophageal ring.   DESCRIPTION OF PROCEDURE:  The patient was familiar with the procedure from  prior examination about 10 years ago and she provided written consent.  Sedation was fentanyl 25 mcg and Versed 10 mg IV without arrhythmias or  desaturation.  The Olympus small-caliber adult videoendoscope was passed  under direct vision.  The vocal cords and larynx looked normal without  obvious LPR findings.  The esophagus was fairly easily entered and had  normal mucosa, even in the distal esophagus, without evidence of reflux  esophagitis, Barrett's esophagus, varices, infection or neoplasia.  There  was a minimal esophageal ring at the squamocolumnar junction and below this  was a 2-cm hiatal hernia.  The stomach contained a small clear residual  which was suctioned up.  No gastritis, erosions, ulcers, polyps or masses  were observed and the pylorus, duodenal bulb and second duodenum looked  normal, as was retroflexion of the proximal stomach, showing just the small  hiatal hernia from the inferior perspective.  The scope was then removed  from the patient.  No biopsies were obtained.  She tolerated the procedure  well and there  were no apparent complications.   IMPRESSION:  Small hiatal hernia but no evidence of esophageal mucosal  damage related to her recently-intensified reflux symptoms.   PLAN:  Symptomatic management.  She may benefit from a substitution of an  alternative agent such as Protonix in place of the Prilosec to see if she  gets fewer side-effects on it.                                               Ronald Lobo, M.D.    RB/MEDQ  D:  09/13/2002  T:  09/13/2002  Job:  [id number]   cc:   Billey Chang, M.D.  [address] [place]  [place] [address]  Fax: [phone]

## 2010-12-18 NOTE — H&P (Signed)
NAMEALEXAH, GOIKE                ACCOUNT NO.:  [id number]   MEDICAL RECORD NO.:  [id number]          PATIENT TYPE:  OBV   LOCATION:                               FACILITY:  [facility]   PHYSICIAN:  Selinda Orion, M.D. DATE OF BIRTH:  1956/01/29   DATE OF ADMISSION:  10/27/2004  DATE OF DISCHARGE:                                HISTORY & PHYSICAL   CHIEF COMPLAINT:  Abdominal pain.   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  She remained somewhat sensitive in her lower abdomen.  She noticed  discomfort when she walked, or bounced, or took a step.  Three days ago she  began having symptoms suggestive of UTI.  She subsequently had increasingly  intense abdominal discomfort associated with some nausea and vomiting.  Her  pain progressed to such a severe degree that she left work today and drove  herself to urgent medical care.  She was felt to have an acute abdomen and  was referred for CT scan to [facility] for the possibility of  diverticulitis versus appendicitis.  She noted pain in her abdomen with  vibration of the car, and with walking, with coughing, with deep  inspiration.  She also was noted to have a marked increase in white blood  count to 20,000 with left shift.  She has not had fever.  She has not had  continued nausea or vomiting.  She has had no oral fluid intake since about  7 a.m. today.  On CT scan at  [facility], she was noted to have a  right adnexal cystic mass, well circumscribed, fairly homogeneous in nature.  The possibility of an ovarian neoplasm was suggested and she is referred by  urgent medical care to [place] for continuing care.  She is a primary patient of  Dr. Selinda Orion.  She has no significant previous history of  dysmenorrhea, endometriosis,  previous GYN conditions or problems.  No  previous history of cyclic pelvic pain or abnormal pelvic findings.  She has  type 2 diabetes and is on multiple medications including Actos, Metformin,  Lipitor, Diovan for hypertension with excellent control of her diabetes and  her blood pressure and dyslipidemia.   PAST MEDICAL HISTORY:  Usual childhood disease without sequelae.   MEDICAL ILLNESSES:  1.  Diabetes.  2.  Hypertension.  3.  Dyslipidemia.  All under good control.   PRESENT MEDICATIONS:  1.  Actos 30 mg daily.  2.  Synthroid 0.088.  3.  Diovan/HCT 160/12.5.  4.  Metformin 1,000 mg daily.  5.  Protonix 40 mg daily.  6.  Lipitor 10 mg daily.   ALLERGIES:  1.  PENICILLIN.  2.  CECLOR.  3.  ERYTHROMYCIN.  4.  SEPTRA.   FAMILY HISTORY:  Strong family history of diabetes and cardiovascular  disease including her father and brother.  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   REVIEW OF SYSTEMS:  HEENT:  Denies symptoms.  CARDIORESPIRATORY:  Denies  asthma, cough, bronchitis, shortness of breath.  GI/GU:  History of  cholecystectomy  many years ago, laparoscopic technique.   PHYSICAL EXAMINATION:  GENERAL:  Well-developed, well-nourished, white  female, markedly over ideal weight with high abdomen hip ratio.  VITAL SIGNS:  Weight 254, height 62.5 inches, blood pressure 118/70, pulse  112, temp 98.6.  HEENT:  Pupils are equal, round and reactive to light, accommodate.  Fundi  not examined.  Oropharynx clear.  NECK:  Small thyroid nodule left lobe of the thyroid gland.  No adenopathy.  CHEST:  Clear to P/A.  HEART:  Regular rhythm without murmur or cardiac enlargement.  BREASTS:  Not examined.  ABDOMEN:  Soft with direct and generalized rebound tenderness over the  entire abdomen.  She is most tender just to the right of the midline of her  lower abdomen.  Her rebound tenderness exceed her deep pressure tenderness.  Her  bowel sounds are quite active.  PELVIC:  External genitalia normal female.  Vagina clean with good estrogen  effect.  The cervix is palpated and is virtually flush with the vault.  Her  uterus is high in the vagina and difficult to access through her abdominal  wall.  The fullness in her pelvis can be palpated but I can not discriminate  a distinct mass.  Rectovaginal deferred.  EXTREMITIES:  Negative.  NEUROLOGIC:  Physiologic.   IMPRESSION:  Acute abdomen with a large right adnexal mass with some  peritoneal fluid suspect a functional abnormality such as hemorrhage into a  corpus luteum cyst with torsion.   PLAN:  Ultrasound to further characterize the mass, then on to consideration  of definitive therapy now.  Note, she takes high dose aspirin 325 mg daily.       ___________________________________________  Selinda Orion, M.D.    CWL/MEDQ  D:  10/27/2004  T:  10/28/2004  Job:  [id number]   cc:   Zannie Cove, Dr.  [facility]  [place], [place]. Olena Mater, M.D.  [phone] [place] [address]  Fax: [phone]   Selinda Orion, M.D.  [address] [place]  [place] [address]  Fax: [phone]

## 2010-12-18 NOTE — Op Note (Signed)
NAME:  [name], [name] NO.:  [id number]   MEDICAL RECORD NO.:  [id number]          PATIENT TYPE:  AMB   LOCATION:  [place]                          FACILITY:  [facility]   PHYSICIAN:  [name], MD      DATE OF BIRTH:  October 08, 1955   DATE OF PROCEDURE:  12/08/2006  DATE OF DISCHARGE:                               OPERATIVE REPORT   PREOPERATIVE DIAGNOSIS:  Soft tissue mass, right forearm.   POSTOPERATIVE DIAGNOSIS:  Soft tissue mass, right forearm.   PROCEDURE:  Excise soft tissue mass, right forearm (6 x 4 x 2 cm).   SURGEON:  [name], MD, FACS   PREPARATION:  Betadine.   ANESTHESIA:  Local with intravenous sedation.   COMPLICATIONS:  None.   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  She has noted a soft tissue mass on the ventral aspect of the  right forearm.  This has been present for a number of months and has  gradually increased in size.  It causes her some mild discomfort.  She  feels that it waxes and wanes.  She desires surgical excision.   BODY OF REPORT:  The procedure was done in OR #6 at the [facility].  The patient is brought to the operating room, placed in  a supine position on the operating room table.  Right arm was placed on  arm board and prepped and draped in usual strict aseptic fashion.  Mass  has previously been encircled by the patient with a marking pen.  Skin  is anesthetized with local anesthetic.  A 5 cm incision is made with a  #15 blade.  Subcutaneous tissues are divided with electrocautery and the  mass is encountered.  This looks like a multilobulated lipoma with a  fibrous component.  It is gently dissected out of the subcutaneous  tissues using the electrocautery for hemostasis.  The mass is excised  down to the level of the underlying fascia.  Hemostasis is obtained with  the electrocautery.  Mass is completely excised.  It measures 6 x 4 x 2  cm in dimension and  is submitted to pathology for review.  Good  hemostasis is noted.  Due to the lack of subcutaneous tissues, the skin  is simply closed with interrupted 3-0 Vicryl  subcuticular sutures.  Wound is washed and dried and Benzoin Steri-  Strips are applied.  Sterile dressings are applied, followed by Kerlix,  followed by Ace wrap.  The patient is awakened from anesthesia and  brought to the recovery room.  The patient tolerated the procedure well.      [name], MD  Electronically Signed     TMG/MEDQ  D:  12/08/2006  T:  12/08/2006  Job:  [id number]   cc:   Juanda Bond. [place], M.D.  Reginia Forts, M.D.

## 2011-04-10 IMAGING — CR DG CHEST 2V
2 series · 2 of 2 positions shown · non-contrast
Comparison: Chest radiograph performed 05/10/2009

CLINICAL DATA: Fever, cough and shortness of breath.

CHEST - 2 VIEW

[w chest pa]
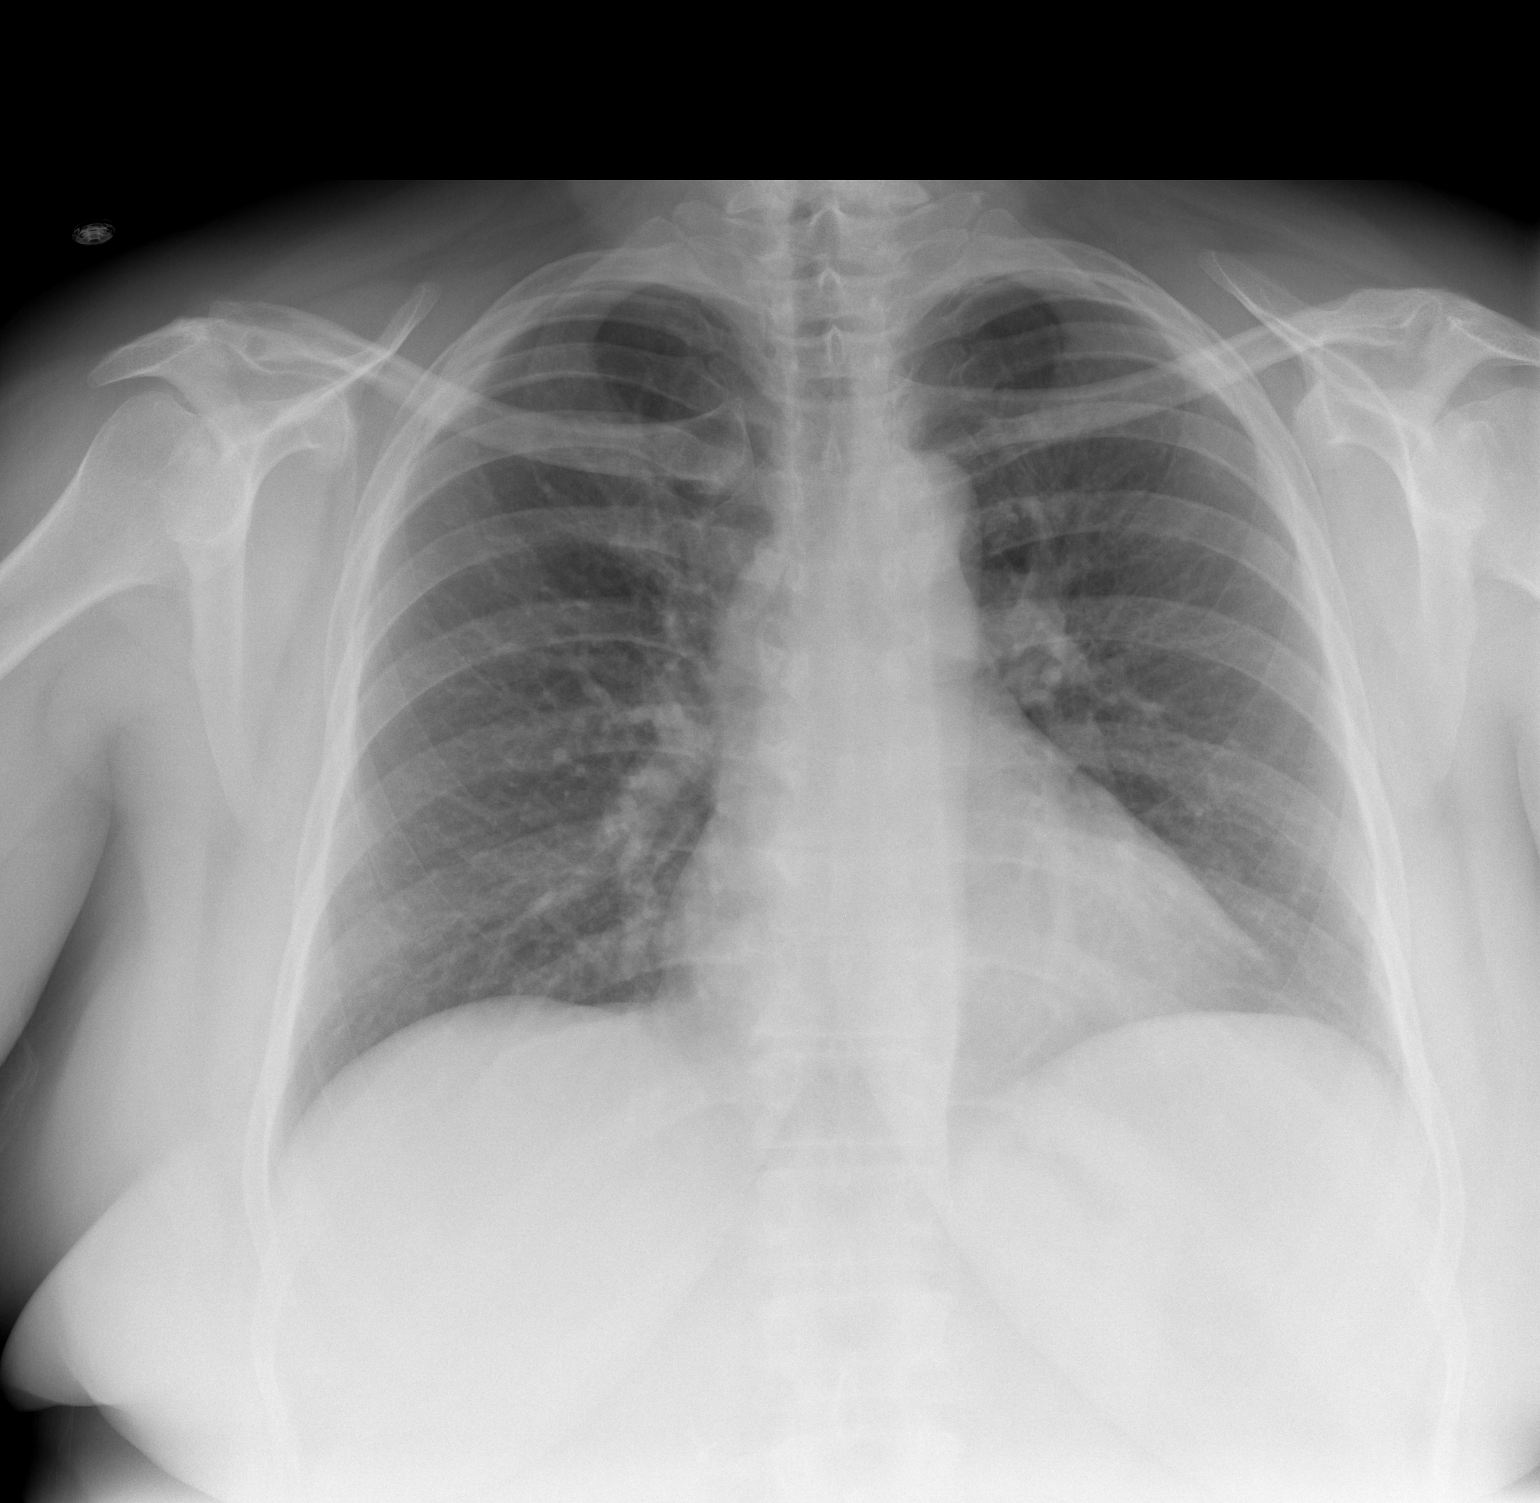

[w chest lat]
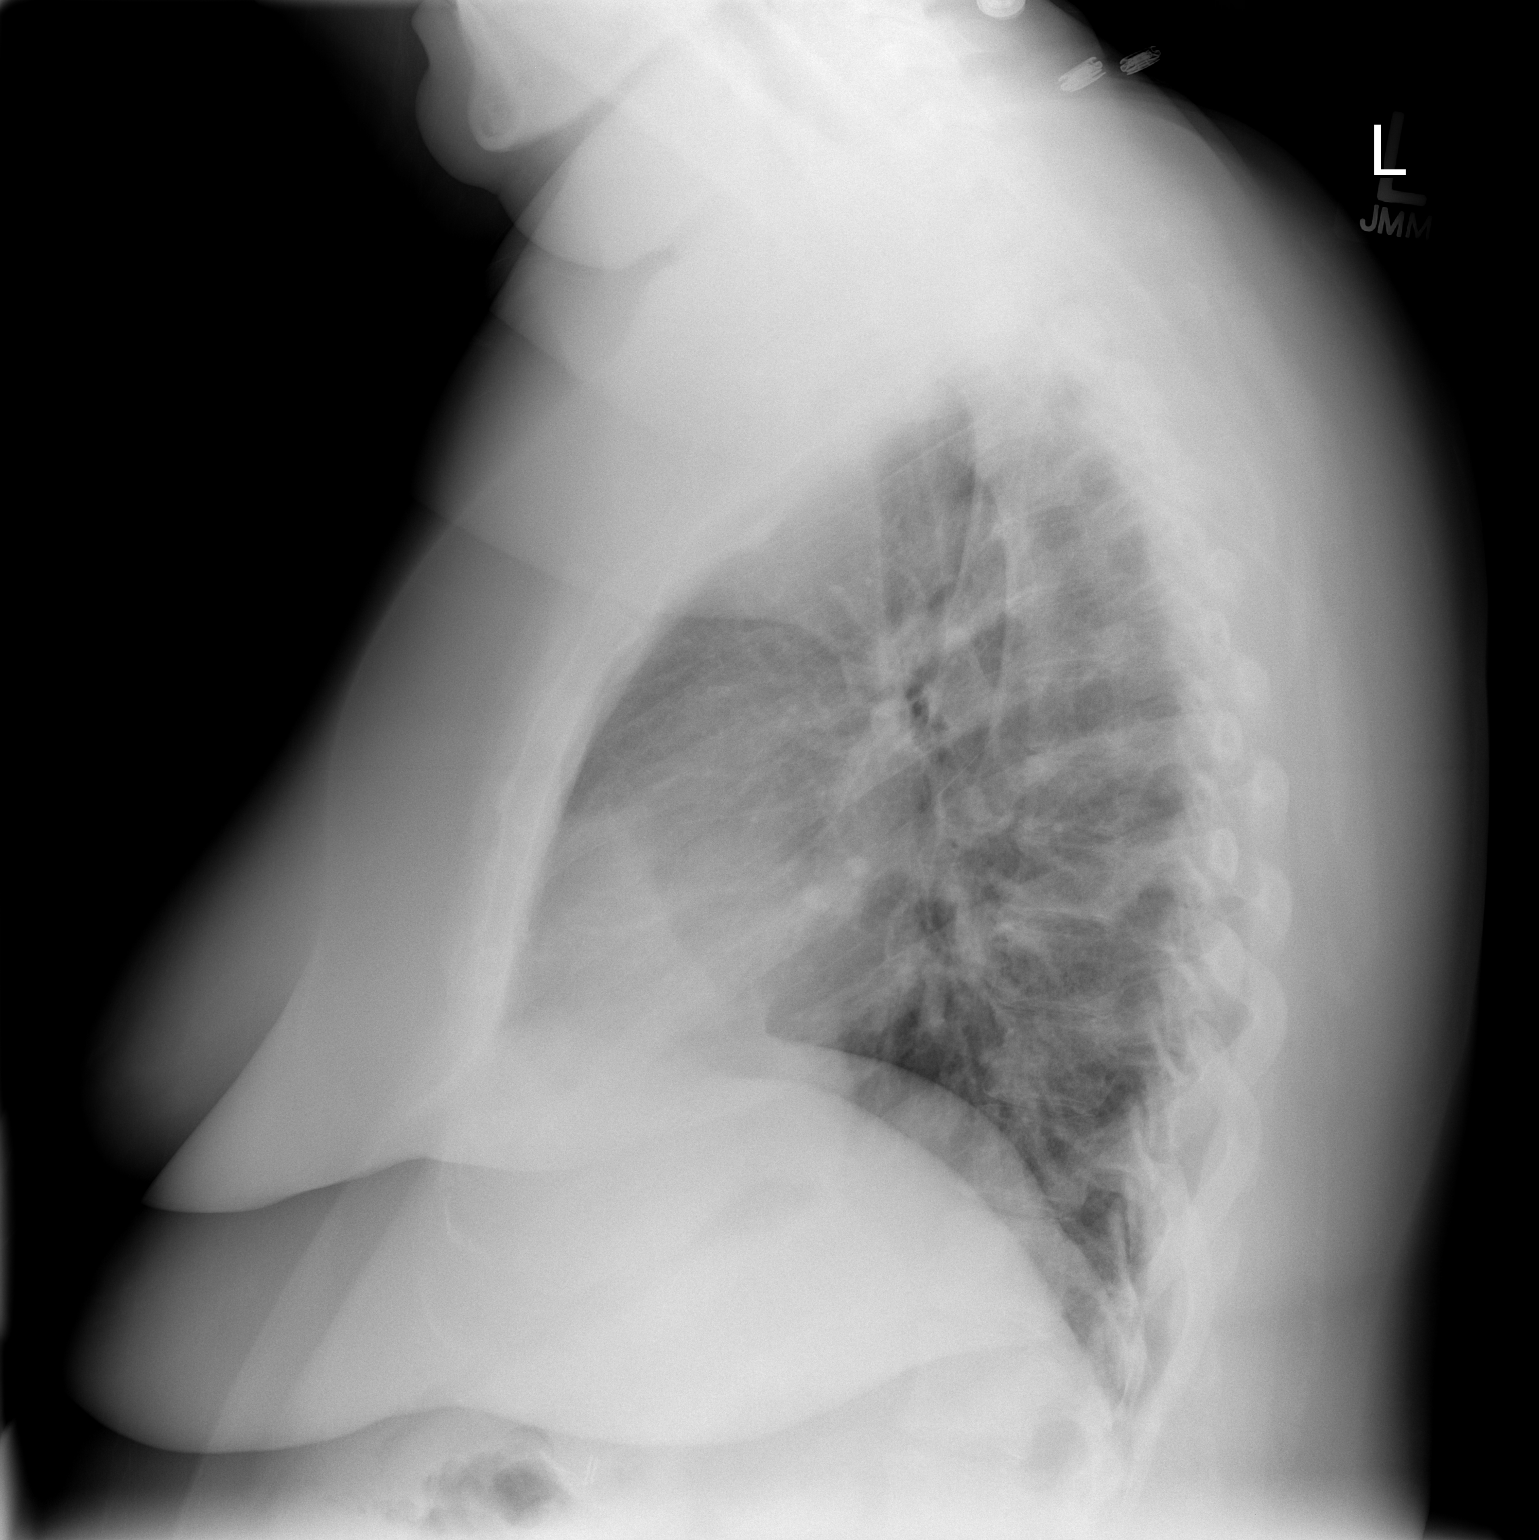

[2 of 2 positions shown; findings below may reference images not displayed]

FINDINGS: The lungs are well-aerated and clear.  There is no
evidence of focal opacification, pleural effusion or pneumothorax.

The heart is normal in size; the mediastinal contour is within
normal limits.  Pulmonary vascularity is at the upper limits of
normal.  No acute osseous abnormalities are seen. Clips are noted
within the right upper quadrant, reflecting prior cholecystectomy.
IMPRESSION: No acute cardiopulmonary process seen.

## 2011-04-23 ENCOUNTER — Encounter (HOSPITAL_BASED_OUTPATIENT_CLINIC_OR_DEPARTMENT_OTHER): Payer: 59 | Admitting: Oncology

## 2011-04-23 ENCOUNTER — Other Ambulatory Visit: Payer: Self-pay | Admitting: Oncology

## 2011-04-23 DIAGNOSIS — D709 Neutropenia, unspecified: Secondary | ICD-10-CM

## 2011-04-23 DIAGNOSIS — D702 Other drug-induced agranulocytosis: Secondary | ICD-10-CM

## 2011-04-23 DIAGNOSIS — C569 Malignant neoplasm of unspecified ovary: Secondary | ICD-10-CM

## 2011-04-23 LAB — CBC WITH DIFFERENTIAL/PLATELET
BASO%: 0.6 % (ref 0.0–2.0)
Basophils Absolute: 0 10*3/uL (ref 0.0–0.1)
HCT: 35 % (ref 34.8–46.6)
HGB: 11.9 g/dL (ref 11.6–15.9)
LYMPH%: 58.4 % — ABNORMAL HIGH (ref 14.0–49.7)
MCHC: 34 g/dL (ref 31.5–36.0)
MONO#: 0.5 10*3/uL (ref 0.1–0.9)
NEUT%: 19.6 % — ABNORMAL LOW (ref 38.4–76.8)
Platelets: 288 10*3/uL (ref 145–400)
WBC: 2.7 10*3/uL — ABNORMAL LOW (ref 3.9–10.3)

## 2011-04-23 LAB — MORPHOLOGY

## 2011-04-27 LAB — URINALYSIS, ROUTINE W REFLEX MICROSCOPIC
Bilirubin Urine: NEGATIVE
Nitrite: NEGATIVE
Specific Gravity, Urine: 1.045 — ABNORMAL HIGH
Urobilinogen, UA: 0.2
pH: 6.5

## 2011-04-27 LAB — COMPREHENSIVE METABOLIC PANEL
AST: 20
BUN: 13
CO2: 31
Calcium: 9.6
Chloride: 101
Creatinine, Ser: 0.76
GFR calc Af Amer: >60
GFR calc non Af Amer: >60
Glucose, Bld: 102 — ABNORMAL HIGH
Total Bilirubin: 0.4

## 2011-04-27 LAB — DIFFERENTIAL
Basophils Absolute: 0
Lymphocytes Relative: 31
Lymphs Abs: 1.4
Neutro Abs: 2.9
Neutrophils Relative %: 61

## 2011-04-27 LAB — CBC
HCT: 37.9
MCHC: 34.5
MCV: 85.6
RBC: 4.42

## 2011-04-27 LAB — LIPASE, BLOOD: Lipase: 16

## 2011-07-16 ENCOUNTER — Other Ambulatory Visit: Payer: Self-pay | Admitting: Oncology

## 2011-07-16 ENCOUNTER — Other Ambulatory Visit (HOSPITAL_BASED_OUTPATIENT_CLINIC_OR_DEPARTMENT_OTHER): Payer: 59

## 2011-07-16 DIAGNOSIS — D72821 Monocytosis (symptomatic): Secondary | ICD-10-CM

## 2011-07-16 DIAGNOSIS — C569 Malignant neoplasm of unspecified ovary: Secondary | ICD-10-CM

## 2011-07-16 DIAGNOSIS — D709 Neutropenia, unspecified: Secondary | ICD-10-CM

## 2011-07-16 LAB — CBC WITH DIFFERENTIAL/PLATELET
Basophils Absolute: 0 10*3/uL (ref 0.0–0.1)
Eosinophils Absolute: 0.1 10*3/uL (ref 0.0–0.5)
MCH: 27.9 pg (ref 25.1–34.0)
MONO%: 23 % — ABNORMAL HIGH (ref 0.0–14.0)
NEUT%: 18.7 % — ABNORMAL LOW (ref 38.4–76.8)
Platelets: 293 10*3/uL (ref 145–400)
RDW: 14.8 % — ABNORMAL HIGH (ref 11.2–14.5)
WBC: 2.8 10*3/uL — ABNORMAL LOW (ref 3.9–10.3)
lymph#: 1.6 10*3/uL (ref 0.9–3.3)

## 2011-07-16 LAB — MORPHOLOGY: PLT EST: ADEQUATE

## 2011-07-19 ENCOUNTER — Telehealth: Payer: Self-pay

## 2011-07-19 NOTE — Telephone Encounter (Signed)
Message copied by Johny Drilling on Mon Jul 19, 2011 11:05 AM ------      Message from: [name]      Created: Fri Jul 16, 2011  4:37 PM       Call patient: CBC no change from her baseline

## 2011-07-19 NOTE — Telephone Encounter (Signed)
Message left for pt to contact our office for lab results. dph

## 2011-08-04 ENCOUNTER — Other Ambulatory Visit: Payer: Self-pay | Admitting: Internal Medicine

## 2011-08-04 DIAGNOSIS — Z1231 Encounter for screening mammogram for malignant neoplasm of breast: Secondary | ICD-10-CM

## 2011-08-09 IMAGING — US US SOFT TISSUE HEAD/NECK
1 series · 13 of 25 positions shown · non-contrast
Comparison: Thyroid ultrasound 07/01/2009, 10/06/2007, and
11/02/2006.

CLINICAL DATA: Follow-up thyroid nodules.  Patient on thyroid
hormone replacement therapy.  Prior biopsy of a left lobe nodule in
2332.

THYROID ULTRASOUND 08/18/2010:
TECHNIQUE: Ultrasound examination of the thyroid gland and adjacent
soft tissues was performed.

[Series 1: us soft tissue head/neck · 0.07mm/px · 13 of 34 slices shown]
[im 1/34]
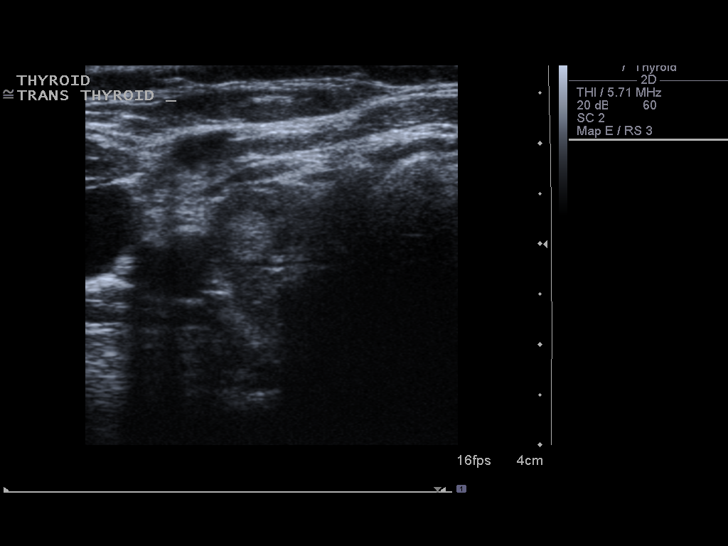
[im 3/34]
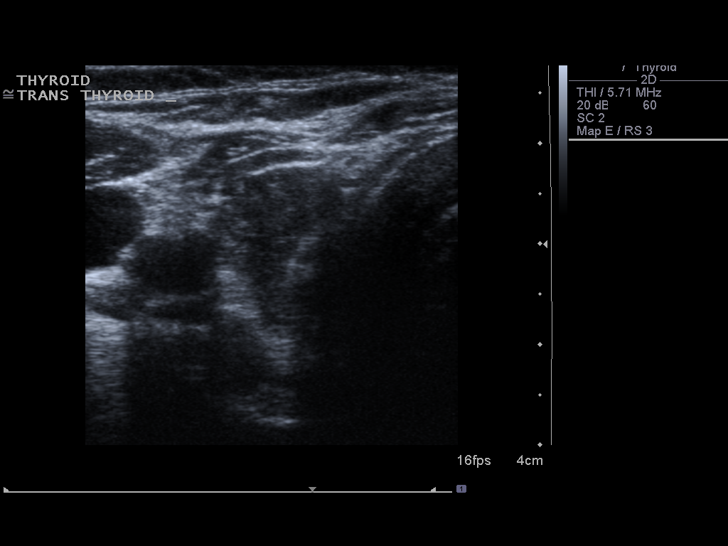
[im 6/34]
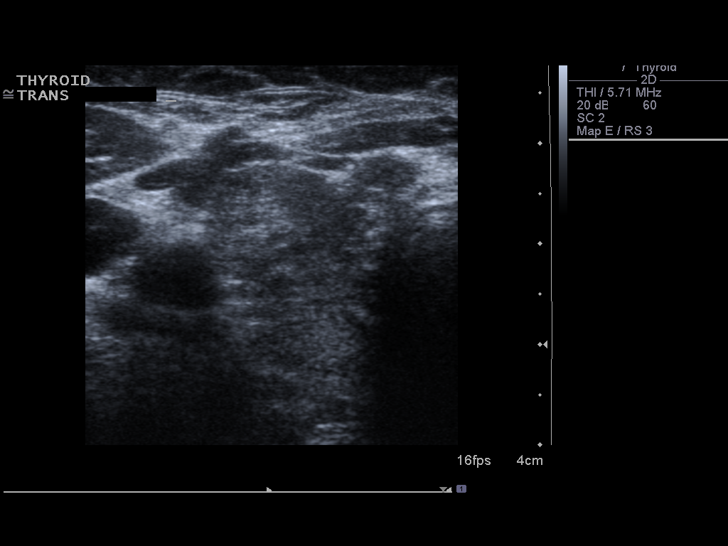
[im 9/34]
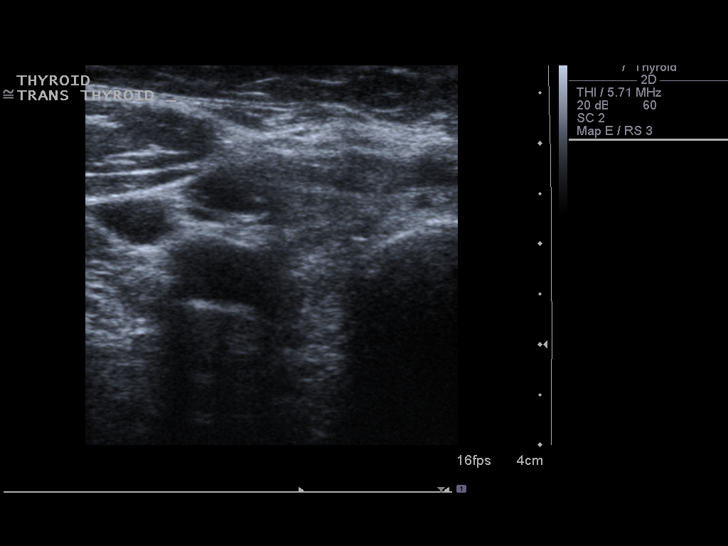
[im 12/34]
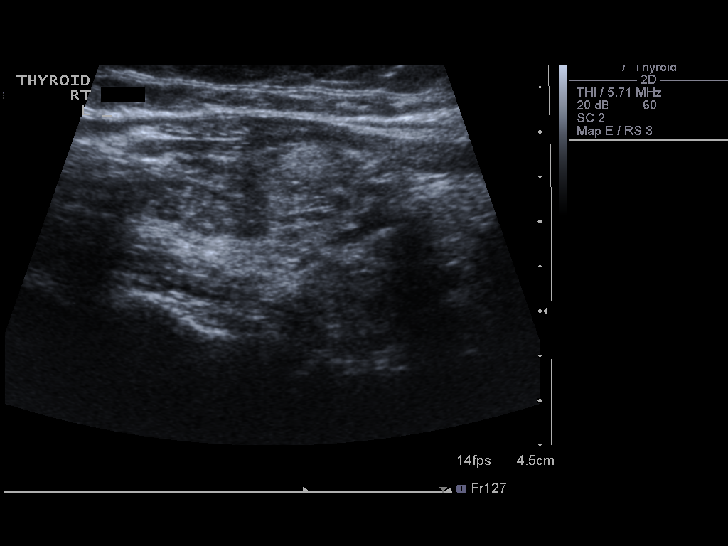
[im 14/34]
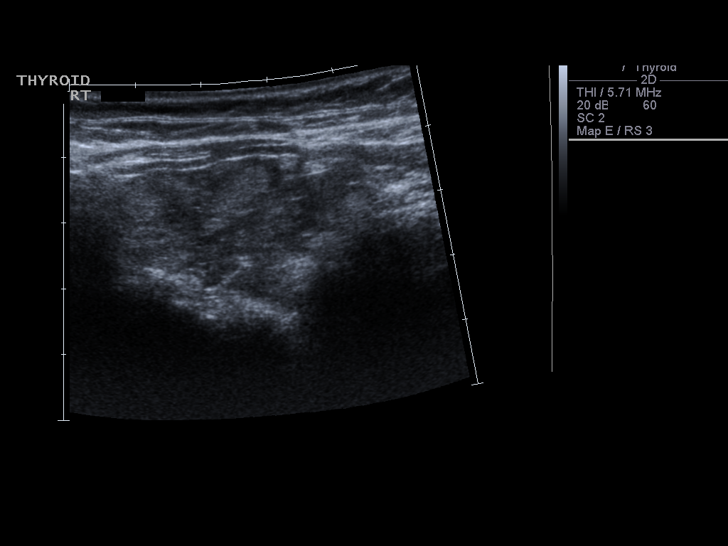
[im 17/34]
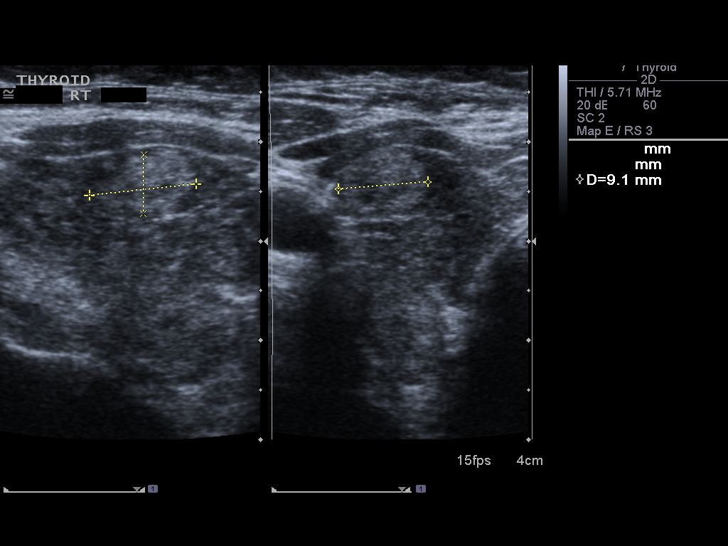
[im 20/34]
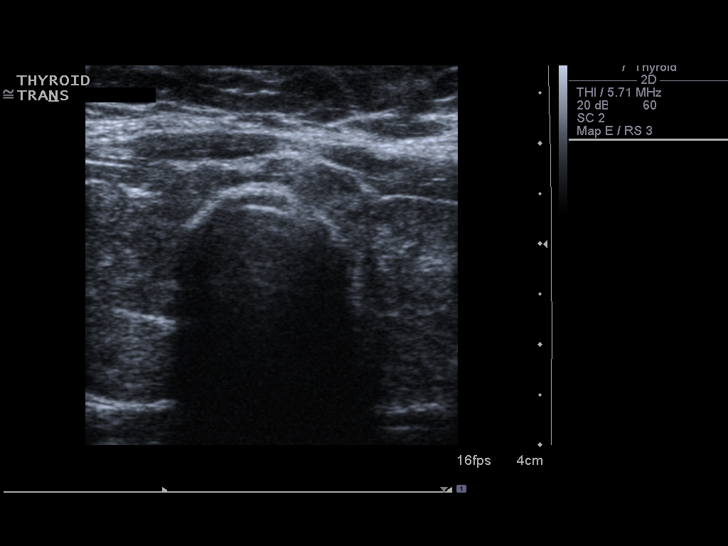
[im 23/34]
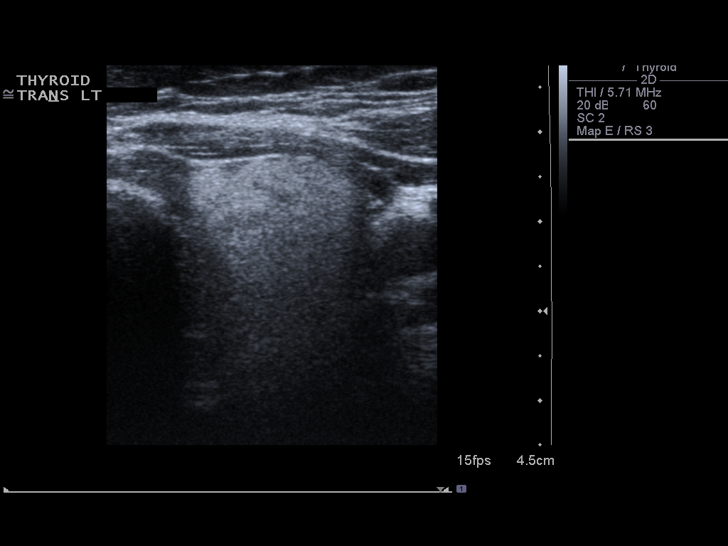
[im 25/34]
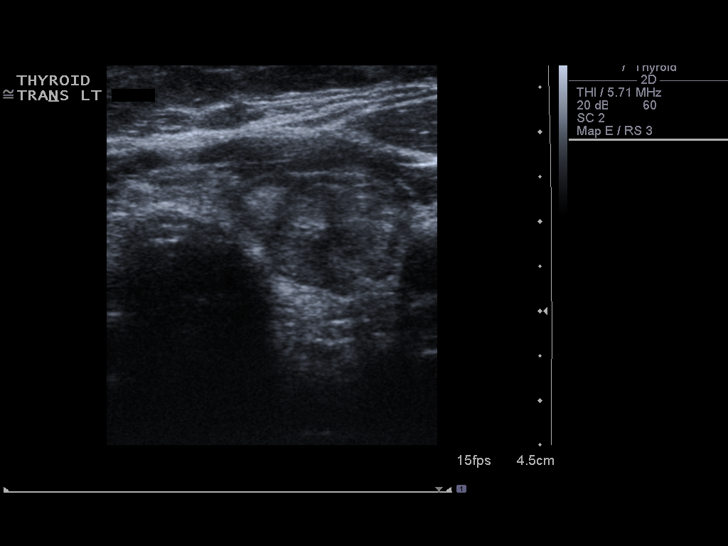
[im 28/34]
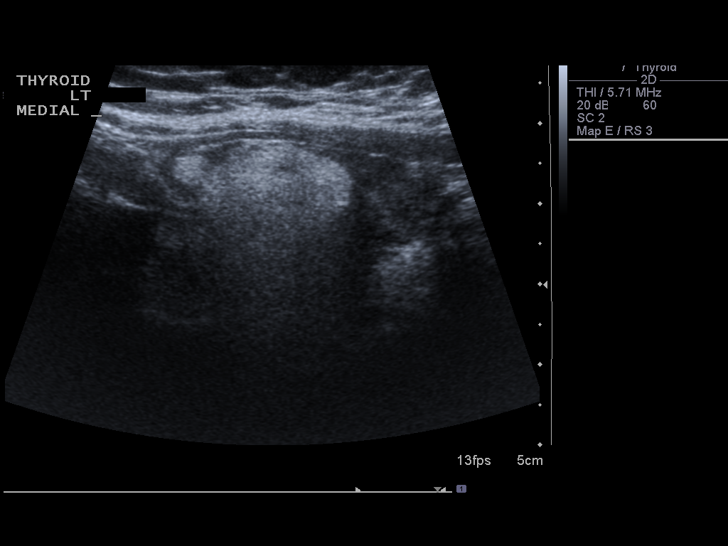
[im 31/34]
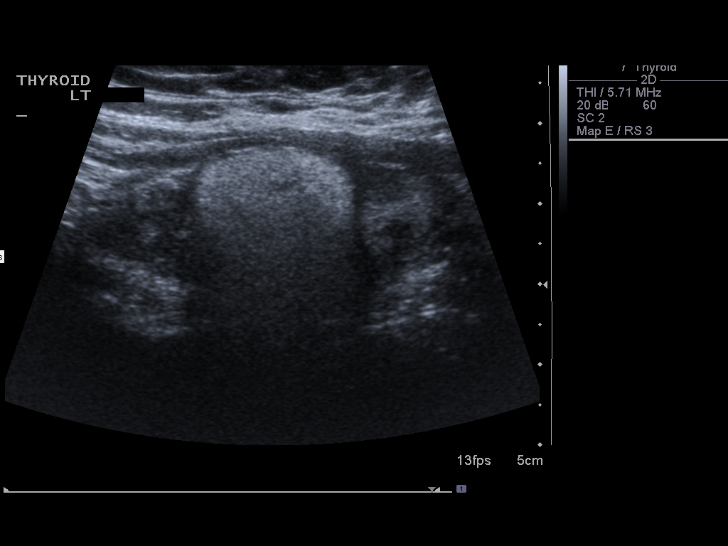
[im 34/34]
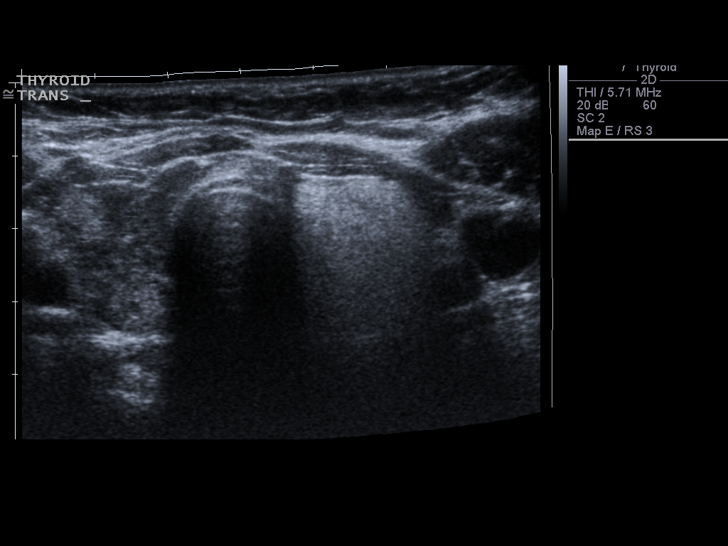

[13 of 25 positions shown; findings below may reference images not displayed]

FINDINGS: Right thyroid lobe:  Mildly enlarged measuring approximately 5.1 x
2.3 x 1.9 cm.  Heterogeneous echotexture.
Left thyroid lobe:  Mildly enlarged measuring approximately 5.6 x
2.0 x 2.3 cm.  Heterogeneous echotexture.
Isthmus:  Normal in thickness measuring 0.3 cm.

Focal nodules:
1.  Stable dominant solid hyperechoic nodule in the mid left lobe
measuring approximately 2.3 x 1.7 x 1.9 cm.
2.  Stable solid hyperechoic nodule in the lower pole of the left
lobe measuring approximately 1.5 x 1.0 x 1.3 cm.
3.  Stable solid hyperechoic nodule in the upper pole of the right
lobe measuring approximately 0.8 x 0.5 x 0.5 cm.
4.  Stable solid hyperechoic nodule in the mid right lobe measuring
approximately 1.1 x 0.6 x 0.9 cm.
5.  No new or enlarging pulmonary nodules.

Lymphadenopathy:  None visualized.
IMPRESSION: 1.  Stable multinodular goiter with the largest dominant nodule in
the left mid thyroid lobe measuring approximately 2.3 cm,
unchanged.
2.  No new or enlarging thyroid nodules.

## 2011-08-13 ENCOUNTER — Ambulatory Visit
Admission: RE | Admit: 2011-08-13 | Discharge: 2011-08-13 | Disposition: A | Discharge status: Home / Self Care | Payer: 59 | Source: Ambulatory Visit | Attending: Internal Medicine | Admitting: Internal Medicine

## 2011-08-13 DIAGNOSIS — Z1231 Encounter for screening mammogram for malignant neoplasm of breast: Secondary | ICD-10-CM

## 2011-09-27 ENCOUNTER — Other Ambulatory Visit: Payer: Self-pay | Admitting: *Deleted

## 2011-09-27 ENCOUNTER — Telehealth: Payer: Self-pay | Admitting: Oncology

## 2011-09-27 NOTE — Telephone Encounter (Signed)
S/w with the pt and she is aware of her may 2013 appts

## 2011-09-28 ENCOUNTER — Other Ambulatory Visit: Payer: Self-pay | Admitting: *Deleted

## 2011-09-28 DIAGNOSIS — D709 Neutropenia, unspecified: Secondary | ICD-10-CM

## 2011-09-28 MED ORDER — CIPROFLOXACIN HCL 500 MG PO TABS: 500.0000 mg | ORAL_TABLET | Freq: Two times a day (BID) | ORAL | Status: AC

## 2011-11-22 ENCOUNTER — Ambulatory Visit: Payer: 59 | Admitting: Oncology

## 2011-11-22 ENCOUNTER — Other Ambulatory Visit: Payer: 59 | Admitting: Lab

## 2011-12-14 ENCOUNTER — Other Ambulatory Visit (HOSPITAL_BASED_OUTPATIENT_CLINIC_OR_DEPARTMENT_OTHER): Payer: 59 | Admitting: Lab

## 2011-12-14 ENCOUNTER — Telehealth: Payer: Self-pay | Admitting: Oncology

## 2011-12-14 ENCOUNTER — Ambulatory Visit (HOSPITAL_BASED_OUTPATIENT_CLINIC_OR_DEPARTMENT_OTHER): Payer: 59 | Admitting: Oncology

## 2011-12-14 ENCOUNTER — Encounter: Payer: Self-pay | Admitting: Oncology

## 2011-12-14 VITALS — BP 141/84 | HR 82 | Temp 97.6°F | Ht 63.0 in | Wt 302.9 lb

## 2011-12-14 DIAGNOSIS — D709 Neutropenia, unspecified: Secondary | ICD-10-CM

## 2011-12-14 DIAGNOSIS — E785 Hyperlipidemia, unspecified: Secondary | ICD-10-CM

## 2011-12-14 DIAGNOSIS — I10 Essential (primary) hypertension: Secondary | ICD-10-CM

## 2011-12-14 DIAGNOSIS — D708 Other neutropenia: Secondary | ICD-10-CM

## 2011-12-14 DIAGNOSIS — C569 Malignant neoplasm of unspecified ovary: Secondary | ICD-10-CM

## 2011-12-14 DIAGNOSIS — T50904A Poisoning by unspecified drugs, medicaments and biological substances, undetermined, initial encounter: Secondary | ICD-10-CM

## 2011-12-14 DIAGNOSIS — E039 Hypothyroidism, unspecified: Secondary | ICD-10-CM | POA: Insufficient documentation

## 2011-12-14 DIAGNOSIS — D702 Other drug-induced agranulocytosis: Secondary | ICD-10-CM

## 2011-12-14 DIAGNOSIS — E119 Type 2 diabetes mellitus without complications: Secondary | ICD-10-CM

## 2011-12-14 HISTORY — DX: Essential (primary) hypertension: I10

## 2011-12-14 LAB — CBC & DIFF AND RETIC
BASO%: 1.6 % (ref 0.0–2.0)
Immature Retic Fract: 17 % — ABNORMAL HIGH (ref 1.60–10.00)
LYMPH%: 58.7 % — ABNORMAL HIGH (ref 14.0–49.7)
MCHC: 31.7 g/dL (ref 31.5–36.0)
MONO#: 0.4 10*3/uL (ref 0.1–0.9)
Platelets: 311 10*3/uL (ref 145–400)
RBC: 4.44 10*6/uL (ref 3.70–5.45)
WBC: 2.5 10*3/uL — ABNORMAL LOW (ref 3.9–10.3)
lymph#: 1.5 10*3/uL (ref 0.9–3.3)
nRBC: 0 % (ref 0–0)

## 2011-12-14 LAB — MORPHOLOGY: PLT EST: ADEQUATE

## 2011-12-14 LAB — LACTATE DEHYDROGENASE: LDH: 161 U/L (ref 94–250)

## 2011-12-14 LAB — CHCC SMEAR

## 2011-12-14 NOTE — Progress Notes (Signed)
Hematology and Oncology Follow Up Visit  [name] [id number] May 19, 1956 56 y.o. 12/14/2011 7:56 PM   Principle Diagnosis: Encounter Diagnoses  Name Primary?  . Chronic neutropenia Yes  . Hypothyroidism   . DM type 2 (diabetes mellitus, type 2)   . 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 She began to develop leukopenia in approximately July of 2010. She underwent an initial bone marrow biopsy at [facility] in [place] on 03/17/2009. but it was nondiagnostic. No gross dysplastic changes, no excess blasts, normal cytogenetics.   Her counts have been stable over observation through this office since December 2010 with some minor fluctuations.  Total white counts run as low as 1800 and as high as 2900.  Percent neutrophils fluctuates very widely from as low as 4% recorded on a CBC done 02/03/2010 to as high as 31% with average being 20%.  CBC today with hemoglobin 11.9, hematocrit 35, MCV 85, white count 2700, 20% neutrophils, 58 lymphocytes, 20 monocytes, platelet count 288,000.  This hemoglobin is stable compared with prior values.  She was given a brief trial of Neupogen to cover a colonoscopy 2 years ago in July 2011 with no rise in her white count.  She's had no interim medical problems. She has had a bronchitis over the last week. Temperatures have not gone over 100. Cough is nonproductive.    Medications: reviewed  Allergies:  Allergies  Allergen Reactions  . Penicillins Swelling    Facial swelling  . Adhesive (Tape)     blisters  . Cefaclor Rash  . Erythromycin     gastritis  . Trimethoprim Rash  . Sulfa Antibiotics Rash    Review of Systems: Constitutional: No  constitutional symptoms   Respiratory: Current bronchitis Cardiovascular: No chest pain or palpitations  Gastrointestinal: No abdominal pain or change in bowel habit Genito-Urinary: No urinary tract symptoms Musculoskeletal: No musculoskeletal pain Neurologic: No headache or change in vision Skin: No rash or ecchymosis Remaining ROS negative.  Physical Exam: Blood pressure 141/84, pulse 82, temperature 97.6 F (36.4 C), temperature source Oral, height 5\' 3"  (1.6 m), weight 302 lb 14.4 oz (137.395 kg). Wt Readings from Last 3 Encounters:  12/14/11 302 lb 14.4 oz (137.395 kg)     General appearance: Overweight Caucasian woman HENNT: Ptosis right eyelid chronic, pharynx no erythema or exudate Lymph nodes: No lymphadenopathy Breasts: Lungs: Clear to auscultation resonant to percussion Heart: Regular rhythm no murmur Abdomen: Soft nontender no mass no organomegaly Extremities: No edema no calf tenderness Vascular: No cyanosis Neurologic: No focal deficit Skin: No rash or ecchymosis  Lab Results: Lab Results  Component Value Date   WBC 2.5* 12/14/2011   HGB 12.0 12/14/2011   HCT 37.9 12/14/2011   MCV 85.4 12/14/2011   PLT 311 12/14/2011     Chemistry      Component Value Date/Time   NA 140 06/29/2010 1532   K 3.8 06/29/2010 1532   CL 100 06/29/2010 1532   CO2 30 06/29/2010 1532   BUN 12 06/29/2010 1532   CREATININE 0.87 06/29/2010 1532      Component Value Date/Time   CALCIUM 9.4 06/29/2010 1532   ALKPHOS 67 06/29/2010 1532   AST 22 06/29/2010 1532   ALT 23 06/29/2010 1532  BILITOT 0.4 06/29/2010 1532       Impression and Plan: 1. Chronic neutropenia.  Overall stable over time.  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 2. 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 Rhodia Albright in [facility], status post adjuvant chemotherapy with carboplatin and Taxol. Now at 7  years with no signs of obvious recurrence. She is due to see Dr. Rhodia Albright for followup again next month 3. Type 2 diabetes. 4. Hypothyroid on replacement. 5. Essential hypertension. 6. Hyperlipidemia.   CC:. Dr. Dwaine Deter; Dr. Ronald Lobo; Dr. Legrand Como Alzheimer; Dr. [name] in [facility], MD 5/14/20137:56 PM

## 2011-12-14 NOTE — Telephone Encounter (Signed)
appts made and printed for pt aom °

## 2012-05-10 DIAGNOSIS — C569 Malignant neoplasm of unspecified ovary: Secondary | ICD-10-CM | POA: Insufficient documentation

## 2012-05-10 DIAGNOSIS — Z8542 Personal history of malignant neoplasm of other parts of uterus: Secondary | ICD-10-CM | POA: Insufficient documentation

## 2012-05-10 DIAGNOSIS — C549 Malignant neoplasm of corpus uteri, unspecified: Secondary | ICD-10-CM | POA: Insufficient documentation

## 2012-06-06 ENCOUNTER — Telehealth: Payer: Self-pay | Admitting: *Deleted

## 2012-06-06 NOTE — Telephone Encounter (Addendum)
Received call from pt stating that she has been released from [facility] to be followed in [place] by Dr. Beryle Beams & wants to know if CA-125 can be added to next weeks labs.  She can be reached at work tomorrow @ [phone] or cell [phone] or home [phone].   Note to Dr Beryle Beams.

## 2012-06-07 ENCOUNTER — Other Ambulatory Visit: Payer: Self-pay | Admitting: Oncology

## 2012-06-07 DIAGNOSIS — C569 Malignant neoplasm of unspecified ovary: Secondary | ICD-10-CM

## 2012-06-12 ENCOUNTER — Telehealth: Payer: Self-pay | Admitting: Oncology

## 2012-06-12 NOTE — Telephone Encounter (Signed)
Called pt appt for 11/12 has been moved to due to MD's call day , to 07/17/12

## 2012-06-13 ENCOUNTER — Ambulatory Visit: Payer: 59 | Admitting: Oncology

## 2012-06-13 ENCOUNTER — Other Ambulatory Visit: Payer: 59 | Admitting: Lab

## 2012-07-17 ENCOUNTER — Telehealth: Payer: Self-pay | Admitting: Oncology

## 2012-07-17 ENCOUNTER — Other Ambulatory Visit (HOSPITAL_BASED_OUTPATIENT_CLINIC_OR_DEPARTMENT_OTHER): Payer: 59 | Admitting: Lab

## 2012-07-17 ENCOUNTER — Ambulatory Visit (HOSPITAL_BASED_OUTPATIENT_CLINIC_OR_DEPARTMENT_OTHER): Payer: 59 | Admitting: Oncology

## 2012-07-17 VITALS — BP 144/89 | HR 86 | Temp 98.0°F | Resp 20 | Ht 63.0 in | Wt 293.7 lb

## 2012-07-17 DIAGNOSIS — D709 Neutropenia, unspecified: Secondary | ICD-10-CM

## 2012-07-17 DIAGNOSIS — C569 Malignant neoplasm of unspecified ovary: Secondary | ICD-10-CM

## 2012-07-17 DIAGNOSIS — Z8543 Personal history of malignant neoplasm of ovary: Secondary | ICD-10-CM | POA: Insufficient documentation

## 2012-07-17 DIAGNOSIS — D708 Other neutropenia: Secondary | ICD-10-CM

## 2012-07-17 LAB — CBC & DIFF AND RETIC
EOS%: 2.4 % (ref 0.0–7.0)
MCH: 28 pg (ref 25.1–34.0)
MCV: 87.3 fL (ref 79.5–101.0)
MONO%: 16.7 % — ABNORMAL HIGH (ref 0.0–14.0)
NEUT#: 0.4 10*3/uL — CL (ref 1.5–6.5)
RBC: 4.32 10*6/uL (ref 3.70–5.45)
RDW: 15.4 % — ABNORMAL HIGH (ref 11.2–14.5)
Retic %: 2.14 % — ABNORMAL HIGH (ref 0.70–2.10)
Retic Ct Abs: 92.45 10*3/uL — ABNORMAL HIGH (ref 33.70–90.70)

## 2012-07-17 LAB — MORPHOLOGY

## 2012-07-17 LAB — CA 125: CA 125: 4.5 U/mL (ref 0.0–30.2)

## 2012-07-17 NOTE — Patient Instructions (Addendum)
CBC every 3 months CA-125 Q 6 months MD visit 6 months

## 2012-07-17 NOTE — Telephone Encounter (Signed)
Gv pt appt schedule for March - June - September 2014. Pt could not do a 12:30pm f/u on [place] and was scheduled for 3pm instead. Per pt she does not want to take off from work.

## 2012-07-18 ENCOUNTER — Telehealth: Payer: Self-pay | Admitting: *Deleted

## 2012-07-18 NOTE — Telephone Encounter (Signed)
Called patient and let her know that CA 125 was low normal at 4.5.  Also let her know that we would send results to Dr. Rhodia Albright.  She requested that her PCP - Dr. Dwaine Deter be sent a copy.  Routed results to both these offices.

## 2012-07-18 NOTE — Progress Notes (Signed)
Hematology and Oncology Follow Up Visit  [name] [id number] October 22, 1955 56 y.o. 07/18/2012 8:55 AM   Principle Diagnosis: Encounter Diagnoses  Name Primary?  . Ovarian cancer Yes  . Ovarian cancer   . 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 She began to develop leukopenia in approximately July of 2010. She underwent an initial bone marrow biopsy at [facility] in [place] on 03/17/2009. but it was nondiagnostic. No gross dysplastic changes, no excess blasts, normal cytogenetics.  Her counts have been stable over observation through this office since December 2010 with some minor fluctuations. Total white counts run as low as 1800 and as high as 2900. Percent neutrophils fluctuates very widely from as low as 4% recorded on a CBC done 02/03/2010 to as high as 31% with average being 20%. Both hemoglobin and platelets have remained stable over time. Despite leukopenia, she has not had any recurrent or severe infections. She did have a bronchitis this winter which lasted for about 3 weeks before resolving. No other interim medical problems and no other infections. She is still working full-time as a Marine scientist at [employer].  Medications: reviewed  Allergies:  Allergies  Allergen Reactions  . Penicillins Swelling    Facial swelling  . Adhesive (Tape)     blisters  . Cefaclor Rash  . Erythromycin     gastritis  . Trimethoprim Rash  . Sulfa Antibiotics Rash    Review of Systems: Constitutional:  No constitutional symptoms  Respiratory: See above. Resolved bronchitis Cardiovascular:  No chest pain or palpitations Gastrointestinal: No abdominal pain or  swelling. Genito-Urinary: No vaginal bleeding. Recent visit with her gynecologist in [facility] Musculoskeletal: No muscle or bone pain Neurologic: No headache or change in vision Skin: No rash or ecchymosis Remaining ROS negative.  Physical Exam: Blood pressure 144/89, pulse 86, temperature 98 F (36.7 C), temperature source Oral, resp. rate 20, height 5\' 3"  (1.6 m), weight 293 lb 11.2 oz (133.221 kg). Wt Readings from Last 3 Encounters:  07/17/12 293 lb 11.2 oz (133.221 kg)  12/14/11 302 lb 14.4 oz (137.395 kg)     General appearance: Well-nourished Caucasian woman HENNT: Right eyelid ptosis. Left eye strabismus. Lymph nodes: No adenopathy Breasts: Not examined Lungs: Clear to auscultation resonant to percussion Heart: Regular rhythm no murmur Abdomen: Soft, nontender, no mass, no organomegaly, no fluid wave Extremities: No edema, no calf tenderness Vascular: No cyanosis Neurologic: Motor strength 5 over 5, reflexes 1+ symmetric Skin: No rash or ecchymosis  Lab Results: Lab Results: White count differential with 16% neutrophils, 64% lymphocytes, 17% monocytes and all of these are within range of prior values.   Component Value Date   WBC 2.5* 07/17/2012   HGB 12.1 07/17/2012   HCT 37.7 07/17/2012   MCV 87.3 07/17/2012   PLT 281 07/17/2012     Chemistry      Component Value Date/Time   NA 140 06/29/2010 1532   K 3.8 06/29/2010 1532   CL 100 06/29/2010 1532   CO2 30 06/29/2010 1532   BUN 12 06/29/2010 1532   CREATININE 0.87 06/29/2010 1532      Component Value Date/Time   CALCIUM 9.4 06/29/2010 1532   ALKPHOS 67 06/29/2010 1532   AST 22 06/29/2010 1532  ALT 23 06/29/2010 1532   BILITOT 0.4 06/29/2010 1532    . CA 125 tumor marker low normal at 4.5 units done today 07/17/2012  Impression and Plan: #1. 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 No gross evidence for recurrent disease now out  over 7 years from diagnosis. Her gynecologist has discharged her to be seen again on a when necessary basis. I will forward every 6 monthly CA 125 tumor markers to his attention.  #2. Chronic leukopenia with absolute granulocytopenia Presumed secondary to chemotherapy given for ovarian cancer. Counts remain low but stable over time. No evidence for transformation to high-grade myelodysplastic syndrome or leukemia. Neutrophils plus monocytes are giving her adequate protection against serious infection. Plan: Continue periodic observation. I am checking counts every 3 months. Repeat bone marrow if any substantial change in pattern.  #3. Benign colonic polyps. She tells me that she will get a one-year followup colonoscopy this spring. Last year we tried to give her Neupogen to increase her white count around the procedure. This really was not very effective. We can try again this year but my guess is that we will run into the same problem. It would be reasonable to put her on some prophylactic antibiotics around the procedure(recommend Cipro 500 mg twice daily to start the day before and continue for 24 hours after the procedure).  #4. Essential hypertension  #5. Hypothyroid on replacement.  #6. Hyperlipidemia  CC:. Dr. Dwaine Deter; Dr. Ronald Lobo; Dr. [name]; Dr. Legrand Como [place]   [name], MD 12/17/20138:55 AM

## 2012-07-18 NOTE — Telephone Encounter (Signed)
Message copied by Ignacia Felling on Tue Jul 18, 2012 10:24 AM ------      Message from: [name]      Created: Mon Jul 17, 2012  4:57 PM       Call pt w result = low normal; forward to Dr [name], [place]

## 2012-07-20 ENCOUNTER — Other Ambulatory Visit: Payer: Self-pay | Admitting: Oncology

## 2012-07-20 DIAGNOSIS — D709 Neutropenia, unspecified: Secondary | ICD-10-CM

## 2012-08-07 ENCOUNTER — Other Ambulatory Visit: Payer: Self-pay | Admitting: Internal Medicine

## 2012-08-07 DIAGNOSIS — Z1231 Encounter for screening mammogram for malignant neoplasm of breast: Secondary | ICD-10-CM

## 2012-09-06 ENCOUNTER — Ambulatory Visit
Admission: RE | Admit: 2012-09-06 | Discharge: 2012-09-06 | Disposition: A | Discharge status: Home / Self Care | Payer: 59 | Source: Ambulatory Visit | Attending: Internal Medicine | Admitting: Internal Medicine

## 2012-09-06 DIAGNOSIS — Z1231 Encounter for screening mammogram for malignant neoplasm of breast: Secondary | ICD-10-CM

## 2012-09-16 ENCOUNTER — Other Ambulatory Visit: Payer: Self-pay

## 2012-10-16 ENCOUNTER — Other Ambulatory Visit (HOSPITAL_BASED_OUTPATIENT_CLINIC_OR_DEPARTMENT_OTHER): Payer: 59 | Admitting: Lab

## 2012-10-16 ENCOUNTER — Other Ambulatory Visit: Payer: 59 | Admitting: Lab

## 2012-10-16 DIAGNOSIS — D709 Neutropenia, unspecified: Secondary | ICD-10-CM

## 2012-10-16 DIAGNOSIS — C569 Malignant neoplasm of unspecified ovary: Secondary | ICD-10-CM

## 2012-10-16 LAB — CBC WITH DIFFERENTIAL/PLATELET
Basophils Absolute: 0 10*3/uL (ref 0.0–0.1)
EOS%: 2.6 % (ref 0.0–7.0)
Eosinophils Absolute: 0.1 10*3/uL (ref 0.0–0.5)
LYMPH%: 65.5 % — ABNORMAL HIGH (ref 14.0–49.7)
MCH: 27.3 pg (ref 25.1–34.0)
MCV: 85.6 fL (ref 79.5–101.0)
MONO%: 17.6 % — ABNORMAL HIGH (ref 0.0–14.0)
NEUT#: 0.4 10*3/uL — CL (ref 1.5–6.5)
NEUT%: 13.9 % — ABNORMAL LOW (ref 38.4–76.8)
WBC: 2.7 10*3/uL — ABNORMAL LOW (ref 3.9–10.3)

## 2012-10-16 LAB — MORPHOLOGY

## 2012-10-19 ENCOUNTER — Telehealth: Payer: Self-pay | Admitting: *Deleted

## 2012-10-19 NOTE — Telephone Encounter (Signed)
Message copied by Ignacia Felling on Thu Oct 19, 2012  4:03 PM ------      Message from: [name]      Created: Tue Oct 17, 2012  8:16 AM       Call pt: no change in counts from her baseline; forward copy to Dr R. Mertha Finders ------

## 2012-10-19 NOTE — Telephone Encounter (Signed)
Left message to call us back for results at all 3 numbers.   Labs forwarded to Dr. Inda Merlin.

## 2012-10-20 NOTE — Telephone Encounter (Signed)
Patient called back.  Spoke with her and let her know no change in counts from baseline.  Let her know we forwarded copy of labs to Dr. Inda Merlin.  Also reviewed her next appts. With Dr. Beryle Beams.

## 2013-01-15 ENCOUNTER — Other Ambulatory Visit: Payer: 59 | Admitting: Lab

## 2013-01-15 ENCOUNTER — Ambulatory Visit (HOSPITAL_BASED_OUTPATIENT_CLINIC_OR_DEPARTMENT_OTHER): Payer: 59 | Admitting: Oncology

## 2013-01-15 ENCOUNTER — Telehealth: Payer: Self-pay | Admitting: Oncology

## 2013-01-15 ENCOUNTER — Other Ambulatory Visit (HOSPITAL_BASED_OUTPATIENT_CLINIC_OR_DEPARTMENT_OTHER): Payer: 59 | Admitting: Lab

## 2013-01-15 VITALS — BP 150/86 | HR 70 | Temp 97.8°F | Resp 18 | Ht 63.0 in | Wt 296.3 lb

## 2013-01-15 DIAGNOSIS — C569 Malignant neoplasm of unspecified ovary: Secondary | ICD-10-CM

## 2013-01-15 DIAGNOSIS — D708 Other neutropenia: Secondary | ICD-10-CM

## 2013-01-15 DIAGNOSIS — D72819 Decreased white blood cell count, unspecified: Secondary | ICD-10-CM

## 2013-01-15 DIAGNOSIS — C561 Malignant neoplasm of right ovary: Secondary | ICD-10-CM

## 2013-01-15 DIAGNOSIS — D709 Neutropenia, unspecified: Secondary | ICD-10-CM

## 2013-01-15 LAB — CBC WITH DIFFERENTIAL/PLATELET
Basophils Absolute: 0.1 10*3/uL (ref 0.0–0.1)
EOS%: 5.2 % (ref 0.0–7.0)
HCT: 36.7 % (ref 34.8–46.6)
HGB: 12.2 g/dL (ref 11.6–15.9)
MCH: 28.3 pg (ref 25.1–34.0)
MCV: 85.3 fL (ref 79.5–101.0)
MONO%: 20.1 % — ABNORMAL HIGH (ref 0.0–14.0)
NEUT%: 10.2 % — ABNORMAL LOW (ref 38.4–76.8)

## 2013-01-15 LAB — MORPHOLOGY: PLT EST: ADEQUATE

## 2013-01-15 NOTE — Telephone Encounter (Signed)
gv and printed appt sched and avs for pt  °

## 2013-01-15 NOTE — Progress Notes (Signed)
Hematology and Oncology Follow Up Visit  [name] [id number] 06-08-1956 57 y.o. 01/15/2013 4:11 PM   Principle Diagnosis: Encounter Diagnoses  Name Primary?  . Chronic neutropenia Yes  . 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 She began to develop leukopenia in approximately July of 2010. She underwent an initial bone marrow biopsy at [facility] in [place] on 03/17/2009. but it was nondiagnostic. No gross dysplastic changes, no excess blasts, normal cytogenetics.  Her counts have been stable over observation through this office since December 2010 with some minor fluctuations. Total white counts run as low as 1800 and as high as 2900. Percent neutrophils fluctuates very widely from as low as 4% recorded on a CBC done 02/03/2010 to as high as 31% with average being 20%.  Both hemoglobin and platelets have remained stable over time.  Despite leukopenia, she has not had any recurrent or severe infections.  She has had no interim medical problems. 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 She is looking forward to enjoying her grandchild. She denies any recent infections. No vaginal bleeding. She gets occasional crampy pain in the right abdomen but nothing persistent or progressive.   Medications: reviewed  Allergies:  Allergies  Allergen Reactions  . Penicillins Swelling    Facial swelling  . Adhesive (Tape)     blisters  . Cefaclor Rash  . Erythromycin     gastritis  . Trimethoprim Rash  . Sulfa Antibiotics Rash    Review of Systems: See history of present illness Remaining ROS negative.  Physical Exam: Blood pressure 150/86, pulse 70,  temperature 97.8 F (36.6 C), temperature source Oral, resp. rate 18, height 5\' 3"  (1.6 m), weight 296 lb 4.8 oz (134.401 kg). Wt Readings from Last 3 Encounters:  01/15/13 296 lb 4.8 oz (134.401 kg)  07/17/12 293 lb 11.2 oz (133.221 kg)  12/14/11 302 lb 14.4 oz (137.395 kg)     General appearance: Overweight Caucasian woman HENNT: Pharynx no erythema or exudate Lymph nodes: No lymphadenopathy Breasts: Lungs: Clear to auscultation resonant to percussion Heart: Regular rhythm no murmur Abdomen: Soft, nontender, no mass, no organomegaly Extremities: No edema, no calf tenderness Musculoskeletal: GU: Most recent GYN exam reported as unremarkable post hysterectomy Vascular: Neurologic: Grossly normal. Chronic ptosis right eye Skin: No rash or ecchymosis  Lab Results: Lab Results. White count differential: Temperature neutrophils, 62% lymphocytes, 20% monocytes   Component Value Date   WBC 3.0* 01/15/2013   HGB 12.2 01/15/2013   HCT 36.7 01/15/2013   MCV 85.3 01/15/2013   PLT 293 01/15/2013     Chemistry      Component Value Date/Time   NA 140 06/29/2010 1532   K 3.8 06/29/2010 1532   CL 100 06/29/2010 1532   CO2 30 06/29/2010 1532   BUN 12 06/29/2010 1532   CREATININE 0.87 06/29/2010 1532      Component Value Date/Time   CALCIUM 9.4 06/29/2010 1532   ALKPHOS 67 06/29/2010 1532   AST 22 06/29/2010 1532   ALT 23 06/29/2010 1532   BILITOT 0.4 06/29/2010 1532       Radiological Studies: Most recent mammogram 09/06/2012 normal   Impression: #1. 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  No gross evidence for recurrent disease now out over 8 years from diagnosis. Continue periodic followup. I will check tumor markers every 6 months. Her primary internist will be doing followup pelvic exams and Pap smears.  #2. Chronic leukopenia with absolute granulocytopenia  Presumed secondary to chemotherapy given for  ovarian cancer.  Counts remain low but stable over time. No evidence for transformation to high-grade myelodysplastic syndrome or leukemia. No recurrent infections. Continue to monitor blood counts every 3 months.  #3. Benign colon polyps. She is due for another colonoscopy. We did give her some limited Neupogen support at time of the previous study but she did not have a significant rise in her white count.    #4. Essential hypertension  #5. Hypothyroid on replacement.  #6. Hyperlipidemia #7. Type 2 diabetes      CC:. Dr. Josetta Huddle; Dr. Ronald Lobo; Dr. [name]; Dr. Legrand Como Alzheimer   [name], MD 6/16/20144:11 PM

## 2013-01-16 ENCOUNTER — Telehealth: Payer: Self-pay | Admitting: *Deleted

## 2013-01-16 LAB — CA 125: CA 125: 3.2 U/mL (ref 0.0–30.2)

## 2013-01-16 NOTE — Telephone Encounter (Signed)
Message copied by Domenic Schwab on Tue Jan 16, 2013 12:41 PM ------      Message from: [name]      Created: Tue Jan 16, 2013  9:14 AM       Call pt w result Ca-125 remains normal ------

## 2013-01-16 NOTE — Telephone Encounter (Signed)
Left message on known voice mail re: results and to call if any questions.

## 2013-04-16 ENCOUNTER — Other Ambulatory Visit (HOSPITAL_BASED_OUTPATIENT_CLINIC_OR_DEPARTMENT_OTHER): Payer: BLUE CROSS/BLUE SHIELD

## 2013-04-16 ENCOUNTER — Other Ambulatory Visit: Payer: 59 | Admitting: Lab

## 2013-04-16 DIAGNOSIS — D708 Other neutropenia: Secondary | ICD-10-CM

## 2013-04-16 DIAGNOSIS — D709 Neutropenia, unspecified: Secondary | ICD-10-CM

## 2013-04-16 LAB — CBC & DIFF AND RETIC
BASO%: 1.8 % (ref 0.0–2.0)
EOS%: 2.7 % (ref 0.0–7.0)
Immature Retic Fract: 18.3 % — ABNORMAL HIGH (ref 1.60–10.00)
MCH: 28 pg (ref 25.1–34.0)
MCHC: 32.3 g/dL (ref 31.5–36.0)
MONO#: 0.4 10*3/uL (ref 0.1–0.9)
NEUT%: 12.3 % — ABNORMAL LOW (ref 38.4–76.8)
RBC: 4.72 10*6/uL (ref 3.70–5.45)
RDW: 14.9 % — ABNORMAL HIGH (ref 11.2–14.5)
Retic Ct Abs: 105.73 10*3/uL — ABNORMAL HIGH (ref 33.70–90.70)
WBC: 2.2 10*3/uL — ABNORMAL LOW (ref 3.9–10.3)
lymph#: 1.5 10*3/uL (ref 0.9–3.3)
nRBC: 0 % (ref 0–0)

## 2013-04-16 LAB — MORPHOLOGY

## 2013-06-07 ENCOUNTER — Other Ambulatory Visit: Payer: Self-pay

## 2013-06-19 ENCOUNTER — Other Ambulatory Visit (HOSPITAL_COMMUNITY)
Admission: RE | Admit: 2013-06-19 | Discharge: 2013-06-19 | Disposition: A | Discharge status: Home / Self Care | Payer: BC Managed Care – PPO | Source: Ambulatory Visit | Attending: Internal Medicine | Admitting: Internal Medicine

## 2013-06-19 ENCOUNTER — Other Ambulatory Visit: Payer: Self-pay | Admitting: Internal Medicine

## 2013-06-19 DIAGNOSIS — Z01419 Encounter for gynecological examination (general) (routine) without abnormal findings: Secondary | ICD-10-CM | POA: Insufficient documentation

## 2013-06-19 DIAGNOSIS — Z1151 Encounter for screening for human papillomavirus (HPV): Secondary | ICD-10-CM | POA: Insufficient documentation

## 2013-07-16 ENCOUNTER — Ambulatory Visit (HOSPITAL_BASED_OUTPATIENT_CLINIC_OR_DEPARTMENT_OTHER): Payer: BC Managed Care – PPO | Admitting: Oncology

## 2013-07-16 ENCOUNTER — Other Ambulatory Visit (HOSPITAL_BASED_OUTPATIENT_CLINIC_OR_DEPARTMENT_OTHER): Payer: BC Managed Care – PPO

## 2013-07-16 ENCOUNTER — Telehealth: Payer: Self-pay | Admitting: Oncology

## 2013-07-16 VITALS — BP 119/74 | HR 73 | Temp 96.7°F | Resp 18 | Ht 63.0 in | Wt 275.0 lb

## 2013-07-16 DIAGNOSIS — E785 Hyperlipidemia, unspecified: Secondary | ICD-10-CM

## 2013-07-16 DIAGNOSIS — C562 Malignant neoplasm of left ovary: Secondary | ICD-10-CM

## 2013-07-16 DIAGNOSIS — C561 Malignant neoplasm of right ovary: Secondary | ICD-10-CM

## 2013-07-16 DIAGNOSIS — C549 Malignant neoplasm of corpus uteri, unspecified: Secondary | ICD-10-CM

## 2013-07-16 DIAGNOSIS — D126 Benign neoplasm of colon, unspecified: Secondary | ICD-10-CM

## 2013-07-16 DIAGNOSIS — D709 Neutropenia, unspecified: Secondary | ICD-10-CM

## 2013-07-16 DIAGNOSIS — D708 Other neutropenia: Secondary | ICD-10-CM

## 2013-07-16 DIAGNOSIS — I10 Essential (primary) hypertension: Secondary | ICD-10-CM

## 2013-07-16 DIAGNOSIS — E119 Type 2 diabetes mellitus without complications: Secondary | ICD-10-CM

## 2013-07-16 DIAGNOSIS — C569 Malignant neoplasm of unspecified ovary: Secondary | ICD-10-CM

## 2013-07-16 LAB — CBC & DIFF AND RETIC
Basophils Absolute: 0 10*3/uL (ref 0.0–0.1)
Eosinophils Absolute: 0.1 10*3/uL (ref 0.0–0.5)
HGB: 12.5 g/dL (ref 11.6–15.9)
Immature Retic Fract: 15.5 % — ABNORMAL HIGH (ref 1.60–10.00)
LYMPH%: 57.5 % — ABNORMAL HIGH (ref 14.0–49.7)
MONO#: 0.7 10*3/uL (ref 0.1–0.9)
NEUT#: 0.5 10*3/uL — CL (ref 1.5–6.5)
Platelets: 273 10*3/uL (ref 145–400)
RBC: 4.53 10*6/uL (ref 3.70–5.45)
Retic %: 2.02 % (ref 0.70–2.10)
WBC: 2.8 10*3/uL — ABNORMAL LOW (ref 3.9–10.3)
nRBC: 0 % (ref 0–0)

## 2013-07-16 LAB — MORPHOLOGY: PLT EST: ADEQUATE

## 2013-07-16 NOTE — Telephone Encounter (Signed)
Gave pt appt for lab and Md with Dr. Alvy Bimler june 2015, former Dr. Beryle Beams pt

## 2013-07-17 NOTE — Progress Notes (Signed)
Hematology and Oncology Follow Up Visit  [name] [id number] [birth year] 57 y.o. 07/17/2013 6:37 PM   Principle Diagnosis: Encounter Diagnoses  Name Primary?  . Chronic neutropenia Yes  . 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 She began to develop leukopenia in approximately July of 2010. She underwent an initial bone marrow biopsy at [facility] in [place] on 03/17/2009. but it was nondiagnostic. No gross dysplastic changes, no excess blasts, normal cytogenetics.  Her counts have been stable over observation through this office since December 2010 with some minor fluctuations. Total white counts run as low as 1800 and as high as 2900. Percent neutrophils fluctuates very widely from as low as 4% recorded on a CBC done 02/03/2010 to as high as 31% with average being 20%.  Both hemoglobin and platelets have remained stable over time.  Despite leukopenia, she has not had any recurrent or severe infections. She has no signs or symptoms of a collagen vascular disorder. I did use Neupogen to stimulate white blood cell production around a colonoscopy procedure a few years ago. She had a limited response.  She reports no interim medical problems. No recent infections. No abdominal pain, change in bowel habit, no vaginal bleeding.    Medications: reviewed  Allergies:  Allergies  Allergen Reactions  . Penicillins Swelling    Facial swelling  . Adhesive [Tape]     blisters  . Cefaclor Rash  . Erythromycin     gastritis  . Trimethoprim Rash  . Sulfa Antibiotics Rash    Review of Systems: Hematology: No bleeding or bruising ENT ROS: No sore throat Breast  ROS: Respiratory ROS: No cough or dyspnea Cardiovascular ROS:  No chest pain or palpitations  Gastrointestinal ROS:  No change in bowel habit Genito-Urinary ROS: No vaginal bleeding Musculoskeletal ROS no j muscle bone or joint complaints Neurological ROS: Chronic ptosis right eyelid Dermatological ROS: No rash or ecchymosis Remaining ROS negative.  Physical Exam: Blood pressure 119/74, pulse 73, temperature 96.7 F (35.9 C), temperature source Oral, resp. rate 18, height 5\' 3"  (1.6 m), weight 275 lb (124.739 kg). Wt Readings from Last 3 Encounters:  07/16/13 275 lb (124.739 kg)  01/15/13 296 lb 4.8 oz (134.401 kg)  07/17/12 293 lb 11.2 oz (133.221 kg)     General appearance: Well-nourished Caucasian woman HENNT: Pharynx no erythema, exudate, mass, or ulcer. No thyromegaly or thyroid nodules Lymph nodes: No cervical, supraclavicular, or axillary lymphadenopathy Breasts:  Lungs: Clear to auscultation, resonant to percussion throughout Heart: Regular rhythm, no murmur, no gallop, no rub, no click, no edema Abdomen: Soft, nontender, normal bowel sounds, no mass, no organomegaly Extremities: No edema, no calf tenderness Musculoskeletal: no joint deformities GU:  Vascular: Carotid pulses 2+, no bruits, Neurologic: Alert, oriented, PERRLA, ptosis right eye,   cranial nerves grossly normal, motor strength 5 over 5, reflexes 1+ symmetric, upper body coordination normal, gait normal, Skin: No rash or ecchymosis  Lab Results: CBC W/Diff    Component Value Date/Time   WBC 2.8* 07/16/2013 1444   WBC 3.0* 04/19/2010 2037   RBC 4.53 07/16/2013 1444   RBC 4.36 04/19/2010 2037   HGB 12.5 07/16/2013 1444   HGB 12.4 04/19/2010 2037   HCT 39.1 07/16/2013 1444  HCT 38.2 04/19/2010 2037   PLT 273 07/16/2013 1444   PLT 253 04/19/2010 2037   MCV 86.3 07/16/2013 1444   MCV 87.6 04/19/2010 2037   MCH 27.6 07/16/2013 1444   MCH 28.6 08/31/2010 1544   MCHC 32.0 07/16/2013 1444   MCHC 32.5  04/19/2010 2037   RDW 15.4* 07/16/2013 1444   RDW 15.8* 04/19/2010 2037   LYMPHSABS 1.6 07/16/2013 1444   LYMPHSABS 1.2 04/19/2010 2037   MONOABS 0.7 07/16/2013 1444   MONOABS 0.8 04/19/2010 2037   EOSABS 0.1 07/16/2013 1444   EOSABS 0.0 04/19/2010 2037   BASOSABS 0.0 07/16/2013 1444   BASOSABS 0.0 04/19/2010 2037     Chemistry      Component Value Date/Time   NA 140 06/29/2010 1532   K 3.8 06/29/2010 1532   CL 100 06/29/2010 1532   CO2 30 06/29/2010 1532   BUN 12 06/29/2010 1532   CREATININE 0.87 06/29/2010 1532      Component Value Date/Time   CALCIUM 9.4 06/29/2010 1532   ALKPHOS 67 06/29/2010 1532   AST 22 06/29/2010 1532   ALT 23 06/29/2010 1532   BILITOT 0.4 06/29/2010 1532    CA-125 tumor marker remains normal at 5.1 units.   Impression:  #1. 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  No gross evidence for recurrent disease now out over 8 years from diagnosis.  Continue periodic followup. I will check tumor markers every 6 months. Her primary internist will be doing followup pelvic exams and Pap smears.   #2. Chronic leukopenia with absolute granulocytopenia  Presumed secondary to chemotherapy given for ovarian cancer.  Counts remain low but stable over time. No evidence for transformation to high-grade myelodysplastic syndrome or leukemia. No recurrent infections.  Continue to monitor blood counts every 3 months.   #3. Benign colon polyps.  She is due for another colonoscopy. We did give her some limited Neupogen support at time of the previous study but she did not have a significant rise in her white count. She will have the procedure done after the holidays and will call us to help coordinate. I will likely give her another brief trial of Neupogen to try to started a few days prior to the procedure so we can have time to see a response.  #4. Essential hypertension   #5. Hypothyroid on replacement.    #6. Hyperlipidemia   #7. Type 2 diabetes  CC: Patient Care Team: Josetta Huddle, MD as PCP - General (Internal Medicine)   [name], MD 12/16/20146:37 PM

## 2013-07-18 ENCOUNTER — Telehealth: Payer: Self-pay | Admitting: *Deleted

## 2013-07-18 NOTE — Telephone Encounter (Signed)
Message copied by Ignacia Felling on Wed Jul 18, 2013  3:20 PM ------      Message from: [name]      Created: Tue Jul 17, 2013  7:16 AM       Call pt: Ca-125 remains normal at 5.1 ------

## 2013-07-18 NOTE — Telephone Encounter (Signed)
Spoke with patient and let her know that CA-125 remains normal at 5.1.  Reviewed her last few results of CA-125.  She appreciated the phone call.

## 2013-07-24 ENCOUNTER — Telehealth: Payer: Self-pay | Admitting: *Deleted

## 2013-07-24 NOTE — Telephone Encounter (Signed)
Received call from [facility] stating that they are trying to schedule a repeat colonoscopy for 08/21/13 @ [facility] in the am & would like Dr Beryle Beams to order neupogen & also asked about any ATB.  If this date isn't convenient, they will look at March.  Note to Dr Beryle Beams.  Requested call back to [phone].

## 2013-07-30 ENCOUNTER — Encounter (HOSPITAL_COMMUNITY): Payer: Self-pay | Admitting: Pharmacy Technician

## 2013-08-06 ENCOUNTER — Other Ambulatory Visit: Payer: Self-pay | Admitting: Oncology

## 2013-08-06 DIAGNOSIS — D709 Neutropenia, unspecified: Secondary | ICD-10-CM

## 2013-08-07 ENCOUNTER — Other Ambulatory Visit: Payer: Self-pay | Admitting: *Deleted

## 2013-08-07 ENCOUNTER — Encounter (HOSPITAL_COMMUNITY): Payer: Self-pay | Admitting: *Deleted

## 2013-08-07 ENCOUNTER — Telehealth: Payer: Self-pay | Admitting: Hematology and Oncology

## 2013-08-07 MED ORDER — LEVOFLOXACIN 500 MG PO TABS: 500.0000 mg | ORAL_TABLET | Freq: Every day | ORAL | Status: DC

## 2013-08-07 NOTE — Telephone Encounter (Signed)
Per Dr Beryle Beams, pt to start neupogen x 5 days on 08/20/13 & levoquin daily x 5 days also.  Pt had questions about neupogen & wanted to clarify this with Dr Beryle Beams.  Discussed with Dr. Beryle Beams & confirmed.  Dr Beryle Beams said since colonoscopy scheduled on tues & we are closed on Sun that the only options is to start on Monday & he is giving for 5 days since she really didn't get a bump in her WBC last time.  Informed pt.  Dr. Beryle Beams had called Dr Buccini's office already with instructions.

## 2013-08-07 NOTE — Progress Notes (Signed)
08-07-13 1100-Dr. Granfortuna- Pt. Has concerns for if planned Neupogen injections scheduled Jan. 18,19, 20th okay for planned 08-21-13 1100 AM Colonoscopy procedure -[facility] with Dr. Cristina Gong, please advise patient., can be reached at phone # [phone]/ cell [phone]. [name]

## 2013-08-07 NOTE — Telephone Encounter (Signed)
s.w. pt and advised on all appt Jan 2015...pt ok and aware

## 2013-08-20 ENCOUNTER — Ambulatory Visit (HOSPITAL_BASED_OUTPATIENT_CLINIC_OR_DEPARTMENT_OTHER): Payer: BC Managed Care – PPO

## 2013-08-20 VITALS — BP 126/60 | HR 73 | Temp 98.0°F

## 2013-08-20 DIAGNOSIS — D709 Neutropenia, unspecified: Secondary | ICD-10-CM

## 2013-08-20 DIAGNOSIS — D708 Other neutropenia: Secondary | ICD-10-CM

## 2013-08-20 MED ORDER — FILGRASTIM 480 MCG/0.8ML IJ SOLN
480.0000 ug | Freq: Once | INTRAMUSCULAR | Status: AC
  Administered 2013-08-20: 480 ug via SUBCUTANEOUS
  Filled 2013-08-20: qty 0.8

## 2013-08-20 NOTE — Patient Instructions (Signed)
Filgrastim, G-CSF injection What is this medicine? FILGRASTIM, G-CSF (fil GRA stim) stimulates the formation of white blood cells. This medicine is given to patients with conditions that may cause a decrease in white blood cells, like those receiving certain types of chemotherapy or bone marrow transplant. It helps the bone marrow recover its ability to produce white blood cells. Increasing the amount of white blood cells helps to decrease the risk of infection and fever. This medicine may be used for other purposes; ask your health care provider or pharmacist if you have questions. COMMON BRAND NAME(S): Neupogen What should I tell my health care provider before I take this medicine? They need to know if you have any of these conditions: -currently receiving radiation therapy -sickle cell disease -an unusual or allergic reaction to filgrastim, E. coli protein, other medicines, foods, dyes, or preservatives -pregnant or trying to get pregnant -breast-feeding How should I use this medicine? This medicine is for injection into a vein or injection under the skin. It is usually given by a health care professional in a hospital or clinic setting. If you get this medicine at home, you will be taught how to prepare and give this medicine. Always change the site for the injection under the skin. Let the solution warm to room temperature before you use it. Do not shake the solution before you withdraw a dose. Throw away any unused portion. Use exactly as directed. Take your medicine at regular intervals. Do not take your medicine more often than directed. It is important that you put your used needles and syringes in a special sharps container. Do not put them in a trash can. If you do not have a sharps container, call your pharmacist or healthcare provider to get one. Talk to your pediatrician regarding the use of this medicine in children. While this medicine may be prescribed for children for selected  conditions, precautions do apply. Overdosage: If you think you have taken too much of this medicine contact a poison control center or emergency room at once. NOTE: This medicine is only for you. Do not share this medicine with others. What if I miss a dose? Try not to miss doses. If you miss a dose take the dose as soon as you remember. If it is almost time for the next dose, do not take double doses unless told to by your doctor or health care professional. What may interact with this medicine? -lithium -medicines for cancer chemotherapy This list may not describe all possible interactions. Give your health care provider a list of all the medicines, herbs, non-prescription drugs, or dietary supplements you use. Also tell them if you smoke, drink alcohol, or use illegal drugs. Some items may interact with your medicine. What should I watch for while using this medicine? Visit your doctor or health care professional for regular checks on your progress. If you get a fever or any sign of infection while you are using this medicine, do not treat yourself. Check with your doctor or health care professional. Bone pain can usually be relieved by mild pain relievers such as acetaminophen or ibuprofen. Check with your doctor or health care professional before taking these medicines as they may hide a fever. Call your doctor or health care professional if the aches and pains are severe or do not go away. What side effects may I notice from receiving this medicine? Side effects that you should report to your doctor or health care professional as soon as possible: -allergic reactions   like skin rash, itching or hives, swelling of the face, lips, or tongue -difficulty breathing, wheezing -fever -pain, redness, or swelling at the injection site -stomach or side pain, or pain at the shoulder Side effects that usually do not require medical attention (report to your doctor or health care professional if they  continue or are bothersome): -bone pain (ribs, lower back, breast bone) -headache -skin rash This list may not describe all possible side effects. Call your doctor for medical advice about side effects. You may report side effects to FDA at 1-800-FDA-1088. Where should I keep my medicine? Keep out of the reach of children. Store in a refrigerator between 2 and 8 degrees C (36 and 46 degrees F). Do not freeze or leave in direct sunlight. If vials or syringes are left out of the refrigerator for more than 24 hours, they must be thrown away. Throw away unused vials after the expiration date on the carton. NOTE: This sheet is a summary. It may not cover all possible information. If you have questions about this medicine, talk to your doctor, pharmacist, or health care provider.  2014, Elsevier/Gold Standard. (2007-10-04 13:33:21)  

## 2013-08-21 ENCOUNTER — Ambulatory Visit (HOSPITAL_BASED_OUTPATIENT_CLINIC_OR_DEPARTMENT_OTHER): Payer: BC Managed Care – PPO

## 2013-08-21 ENCOUNTER — Ambulatory Visit (HOSPITAL_COMMUNITY): Payer: BC Managed Care – PPO | Admitting: Anesthesiology

## 2013-08-21 ENCOUNTER — Telehealth: Payer: Self-pay | Admitting: *Deleted

## 2013-08-21 ENCOUNTER — Encounter (HOSPITAL_COMMUNITY)
Admission: RE | Disposition: A | Discharge status: Home / Self Care | Payer: Self-pay | Source: Ambulatory Visit | Attending: Gastroenterology

## 2013-08-21 ENCOUNTER — Ambulatory Visit (HOSPITAL_COMMUNITY)
Admission: RE | Admit: 2013-08-21 | Discharge: 2013-08-21 | Disposition: A | Discharge status: Home / Self Care | Payer: BC Managed Care – PPO | Source: Ambulatory Visit | Attending: Gastroenterology | Admitting: Gastroenterology

## 2013-08-21 ENCOUNTER — Encounter (HOSPITAL_COMMUNITY): Payer: Self-pay | Admitting: *Deleted

## 2013-08-21 ENCOUNTER — Encounter (HOSPITAL_COMMUNITY): Payer: BC Managed Care – PPO | Admitting: Anesthesiology

## 2013-08-21 ENCOUNTER — Other Ambulatory Visit (HOSPITAL_BASED_OUTPATIENT_CLINIC_OR_DEPARTMENT_OTHER): Payer: BC Managed Care – PPO

## 2013-08-21 VITALS — BP 128/58 | HR 79 | Temp 98.4°F

## 2013-08-21 DIAGNOSIS — E039 Hypothyroidism, unspecified: Secondary | ICD-10-CM | POA: Insufficient documentation

## 2013-08-21 DIAGNOSIS — D709 Neutropenia, unspecified: Secondary | ICD-10-CM

## 2013-08-21 DIAGNOSIS — E119 Type 2 diabetes mellitus without complications: Secondary | ICD-10-CM | POA: Insufficient documentation

## 2013-08-21 DIAGNOSIS — Z8543 Personal history of malignant neoplasm of ovary: Secondary | ICD-10-CM | POA: Insufficient documentation

## 2013-08-21 DIAGNOSIS — D708 Other neutropenia: Secondary | ICD-10-CM

## 2013-08-21 DIAGNOSIS — Z7982 Long term (current) use of aspirin: Secondary | ICD-10-CM | POA: Insufficient documentation

## 2013-08-21 DIAGNOSIS — Z79899 Other long term (current) drug therapy: Secondary | ICD-10-CM | POA: Insufficient documentation

## 2013-08-21 DIAGNOSIS — D126 Benign neoplasm of colon, unspecified: Secondary | ICD-10-CM | POA: Insufficient documentation

## 2013-08-21 DIAGNOSIS — I10 Essential (primary) hypertension: Secondary | ICD-10-CM | POA: Insufficient documentation

## 2013-08-21 HISTORY — PX: COLONOSCOPY WITH PROPOFOL: SHX5780

## 2013-08-21 LAB — CBC WITH DIFFERENTIAL/PLATELET
BASO%: 1.3 % (ref 0.0–2.0)
Basophils Absolute: 0.1 10*3/uL (ref 0.0–0.1)
EOS%: 1 % (ref 0.0–7.0)
Eosinophils Absolute: 0 10*3/uL (ref 0.0–0.5)
HEMATOCRIT: 39.5 % (ref 34.8–46.6)
HGB: 12.9 g/dL (ref 11.6–15.9)
LYMPH#: 1.2 10*3/uL (ref 0.9–3.3)
LYMPH%: 28 % (ref 14.0–49.7)
MCH: 27.8 pg (ref 25.1–34.0)
MCHC: 32.7 g/dL (ref 31.5–36.0)
MCV: 84.9 fL (ref 79.5–101.0)
MONO#: 1.1 10*3/uL — ABNORMAL HIGH (ref 0.1–0.9)
MONO%: 25.8 % — ABNORMAL HIGH (ref 0.0–14.0)
NEUT%: 43.9 % (ref 38.4–76.8)
NEUTROS ABS: 1.9 10*3/uL (ref 1.5–6.5)
Platelets: 284 10*3/uL (ref 145–400)
RBC: 4.65 10*6/uL (ref 3.70–5.45)
RDW: 16.3 % — ABNORMAL HIGH (ref 11.2–14.5)
WBC: 4.2 10*3/uL (ref 3.9–10.3)

## 2013-08-21 SURGERY — COLONOSCOPY WITH PROPOFOL
Anesthesia: Monitor Anesthesia Care

## 2013-08-21 MED ORDER — PROMETHAZINE HCL 25 MG/ML IJ SOLN: 6.2500 mg | INTRAMUSCULAR | Status: DC | PRN

## 2013-08-21 MED ORDER — LACTATED RINGERS IV SOLN
INTRAVENOUS | Status: DC
Start: 2013-08-21 — End: 2013-08-21
  Administered 2013-08-21: 11:00:00 via INTRAVENOUS
  Administered 2013-08-21: 1000 mL via INTRAVENOUS

## 2013-08-21 MED ORDER — FILGRASTIM 480 MCG/0.8ML IJ SOLN
480.0000 ug | Freq: Once | INTRAMUSCULAR | Status: AC
  Administered 2013-08-21: 480 ug via SUBCUTANEOUS
  Filled 2013-08-21: qty 0.8

## 2013-08-21 MED ORDER — PROPOFOL 10 MG/ML IV BOLUS
INTRAVENOUS | Status: AC
  Filled 2013-08-21: qty 20

## 2013-08-21 MED ORDER — MIDAZOLAM HCL 5 MG/5ML IJ SOLN
INTRAMUSCULAR | Status: DC | PRN
  Administered 2013-08-21: 2 mg via INTRAVENOUS

## 2013-08-21 MED ORDER — MIDAZOLAM HCL 2 MG/2ML IJ SOLN
INTRAMUSCULAR | Status: AC
  Filled 2013-08-21: qty 2

## 2013-08-21 MED ORDER — PROPOFOL INFUSION 10 MG/ML OPTIME
INTRAVENOUS | Status: DC | PRN
  Administered 2013-08-21: 140 ug/kg/min via INTRAVENOUS

## 2013-08-21 SURGICAL SUPPLY — 21 items

## 2013-08-21 NOTE — Telephone Encounter (Signed)
Message copied by Jesse Fall on Tue Aug 21, 2013  5:09 PM ------      Message from: [name]      Created: Tue Aug 21, 2013 10:54 AM       Call pt: nice bump in WBC: 4,200 with 44% neutrophils and 26% monocytes ------

## 2013-08-21 NOTE — Anesthesia Postprocedure Evaluation (Signed)
  Anesthesia Post-op Note  Patient: Financial planner  Procedure(s) Performed: Procedure(s) (LRB): COLONOSCOPY WITH PROPOFOL (N/A)  Patient Location: PACU  Anesthesia Type: MAC  Level of Consciousness: awake and alert   Airway and Oxygen Therapy: Patient Spontanous Breathing  Post-op Pain: mild  Post-op Assessment: Post-op Vital signs reviewed, Patient's Cardiovascular Status Stable, Respiratory Function Stable, Patent Airway and No signs of Nausea or vomiting  Last Vitals:  Filed Vitals:   08/21/13 1018  BP: 137/71  Temp: 37.1 C  Resp: 14    Post-op Vital Signs: stable   Complications: No apparent anesthesia complications

## 2013-08-21 NOTE — Patient Instructions (Signed)
Filgrastim, G-CSF injection What is this medicine? FILGRASTIM, G-CSF (fil GRA stim) stimulates the formation of white blood cells. This medicine is given to patients with conditions that may cause a decrease in white blood cells, like those receiving certain types of chemotherapy or bone marrow transplant. It helps the bone marrow recover its ability to produce white blood cells. Increasing the amount of white blood cells helps to decrease the risk of infection and fever. This medicine may be used for other purposes; ask your health care provider or pharmacist if you have questions. COMMON BRAND NAME(S): Neupogen What should I tell my health care provider before I take this medicine? They need to know if you have any of these conditions: -currently receiving radiation therapy -sickle cell disease -an unusual or allergic reaction to filgrastim, E. coli protein, other medicines, foods, dyes, or preservatives -pregnant or trying to get pregnant -breast-feeding How should I use this medicine? This medicine is for injection into a vein or injection under the skin. It is usually given by a health care professional in a hospital or clinic setting. If you get this medicine at home, you will be taught how to prepare and give this medicine. Always change the site for the injection under the skin. Let the solution warm to room temperature before you use it. Do not shake the solution before you withdraw a dose. Throw away any unused portion. Use exactly as directed. Take your medicine at regular intervals. Do not take your medicine more often than directed. It is important that you put your used needles and syringes in a special sharps container. Do not put them in a trash can. If you do not have a sharps container, call your pharmacist or healthcare provider to get one. Talk to your pediatrician regarding the use of this medicine in children. While this medicine may be prescribed for children for selected  conditions, precautions do apply. Overdosage: If you think you have taken too much of this medicine contact a poison control center or emergency room at once. NOTE: This medicine is only for you. Do not share this medicine with others. What if I miss a dose? Try not to miss doses. If you miss a dose take the dose as soon as you remember. If it is almost time for the next dose, do not take double doses unless told to by your doctor or health care professional. What may interact with this medicine? -lithium -medicines for cancer chemotherapy This list may not describe all possible interactions. Give your health care provider a list of all the medicines, herbs, non-prescription drugs, or dietary supplements you use. Also tell them if you smoke, drink alcohol, or use illegal drugs. Some items may interact with your medicine. What should I watch for while using this medicine? Visit your doctor or health care professional for regular checks on your progress. If you get a fever or any sign of infection while you are using this medicine, do not treat yourself. Check with your doctor or health care professional. Bone pain can usually be relieved by mild pain relievers such as acetaminophen or ibuprofen. Check with your doctor or health care professional before taking these medicines as they may hide a fever. Call your doctor or health care professional if the aches and pains are severe or do not go away. What side effects may I notice from receiving this medicine? Side effects that you should report to your doctor or health care professional as soon as possible: -allergic reactions   like skin rash, itching or hives, swelling of the face, lips, or tongue -difficulty breathing, wheezing -fever -pain, redness, or swelling at the injection site -stomach or side pain, or pain at the shoulder Side effects that usually do not require medical attention (report to your doctor or health care professional if they  continue or are bothersome): -bone pain (ribs, lower back, breast bone) -headache -skin rash This list may not describe all possible side effects. Call your doctor for medical advice about side effects. You may report side effects to FDA at 1-800-FDA-1088. Where should I keep my medicine? Keep out of the reach of children. Store in a refrigerator between 2 and 8 degrees C (36 and 46 degrees F). Do not freeze or leave in direct sunlight. If vials or syringes are left out of the refrigerator for more than 24 hours, they must be thrown away. Throw away unused vials after the expiration date on the carton. NOTE: This sheet is a summary. It may not cover all possible information. If you have questions about this medicine, talk to your doctor, pharmacist, or health care provider.  2014, Elsevier/Gold Standard. (2007-10-04 13:33:21)  

## 2013-08-21 NOTE — Op Note (Signed)
[facility] [place], [address]   COLONOSCOPY PROCEDURE REPORT  PATIENT: [name], [name]  MR#: [id number] BIRTHDATE: 07-22-1956 , 47  yrs. old GENDER: Female ENDOSCOPIST: Ronald Lobo, MD REFERRED BY:   Dr. Ria Bush, Dr. 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 Because of a history of severe benign neutropenia, neutropenic precautions were taken as follows:   Neupogen the day prior to the procedure, the morning of the procedure (absolute neutrophil count this morning 1900), and Neupogen each day the rest of the week, plus Levaquin yesterday and today.  The patient is also a difficult IV stick, and it took approximately 5 tries, ultimately by the anesthesiologist, to obtain IV access.  After providing written consent and doing time out, the patient received propofol sedation and remained stable throughout the procedure.  The Pentax adult video colonoscope was advanced with ease to the cecum and for short distance into a normal-appearing terminal ileum, and pullback was then performed. The quality of the prep was excellent and felt that all areas were well seen.  The only polyp identified on this exam was a 3 mm sessile polyp in the descending colon removed by a single cold biopsy.  The exam was otherwise normal, without other polyps seen, nor any masses, diverticulosis, colitis, or vascular ectasia.  Retroflexion in the rectum was attempted twice but could not readily be accomplished; careful antegrade viewing, however, discloses no rectal lesions.  The patient tolerated procedure well.       COMPLICATIONS: None  ENDOSCOPIC IMPRESSION:  1.  Solitary diminutive polyp removed on today's exam 2. Prior history of 12 mm flat polyp in the cecum  RECOMMENDATIONS:  1. Await pathology results from today's polyp 2. Anticipate colonoscopic surveillance in 5 years, regardless of current histology, in view of prior history of 12 mm sessile serrated adenoma. 3. The patient will presumably need Neupogen and antibiotics prior to her next exam, and as well as extra time for a difficult IV start.   _______________________________ eSigned:  Ronald Lobo, MD 08/21/2013 11:47 AM     PATIENT NAME:  [name], [name] MR#: [id number]

## 2013-08-21 NOTE — Anesthesia Preprocedure Evaluation (Signed)
Anesthesia Evaluation  Patient identified by MRN, date of birth, ID band Patient awake    Reviewed: Allergy & Precautions, H&P , NPO status , Patient's Chart, lab work & pertinent test results  Airway Mallampati: II TM Distance: >3 FB Neck ROM: Full    Dental no notable dental hx.    Pulmonary neg pulmonary ROS,  breath sounds clear to auscultation  + decreased breath sounds      Cardiovascular hypertension, Rhythm:Regular Rate:Normal     Neuro/Psych negative neurological ROS  negative psych ROS   GI/Hepatic negative GI ROS, Neg liver ROS,   Endo/Other  diabetes, Oral Hypoglycemic AgentsHypothyroidism Morbid obesity  Renal/GU negative Renal ROS  negative genitourinary   Musculoskeletal negative musculoskeletal ROS (+)   Abdominal   Peds negative pediatric ROS (+)  Hematology negative hematology ROS (+)   Anesthesia Other Findings   Reproductive/Obstetrics negative OB ROS                           Anesthesia Physical Anesthesia Plan  ASA: III  Anesthesia Plan: MAC   Post-op Pain Management:    Induction: Intravenous  Airway Management Planned: Simple Face Mask  Additional Equipment:   Intra-op Plan:   Post-operative Plan: Extubation in OR  Informed Consent: I have reviewed the patients History and Physical, chart, labs and discussed the procedure including the risks, benefits and alternatives for the proposed anesthesia with the patient or authorized representative who has indicated his/her understanding and acceptance.   Dental advisory given  Plan Discussed with: CRNA and Surgeon  Anesthesia Plan Comments:         Anesthesia Quick Evaluation

## 2013-08-21 NOTE — Transfer of Care (Signed)
Immediate Anesthesia Transfer of Care Note  Patient: [facility]  Procedure(s) Performed: Procedure(s): COLONOSCOPY WITH PROPOFOL (N/A)  Patient Location: endo recovery Anesthesia Type:MAC  Level of Consciousness: awake and alert   Airway & Oxygen Therapy: Patient Spontanous Breathing and Patient connected to face mask oxygen  Post-op Assessment: Report given to PACU RN and Post -op Vital signs reviewed and stable  Post vital signs: Reviewed and stable  Complications: No apparent anesthesia complications

## 2013-08-21 NOTE — Telephone Encounter (Signed)
Notified pt of good bump in WBC from neupogen per Dr. Azucena Freed request & pt will return tomorrow.

## 2013-08-21 NOTE — Discharge Instructions (Addendum)
We anticipate a repeat colonoscopy in 5 years.  Call us if you have not received a report in 2 weeks with the pathology result on today's small polyp   .Monitored Anesthesia Care  Monitored anesthesia care is an anesthesia service for a medical procedure. Anesthesia is the loss of the ability to feel pain. It is produced by medications called anesthetics. It may affect a small area of your body (local anesthesia), a large area of your body (regional anesthesia), or your entire body (general anesthesia). The need for monitored anesthesia care depends your procedure, your condition, and the potential need for regional or general anesthesia. It is often provided during procedures where:   General anesthesia may be needed if there are complications. This is because you need special care when you are under general anesthesia.   You will be under local or regional anesthesia. This is so that you are able to have higher levels of anesthesia if needed.   You will receive calming medications (sedatives). This is especially the case if sedatives are given to put you in a semi-conscious state of relaxation (deep sedation). This is because the amount of sedative needed to produce this state can be hard to predict. Too much of a sedative can produce general anesthesia. Monitored anesthesia care is performed by one or more caregivers who have special training in all types of anesthesia. You will need to meet with these caregivers before your procedure. During this meeting, they will ask you about your medical history. They will also give you instructions to follow. (For example, you will need to stop eating and drinking before your procedure. You may also need to stop or change medications you are taking.) During your procedure, your caregivers will stay with you. They will:   Watch your condition. This includes watching you blood pressure, breathing, and level of pain.   Diagnose and treat problems that  occur.   Give medications if they are needed. These may include calming medications (sedatives) and anesthetics.   Make sure you are comfortable.  Having monitored anesthesia care does not necessarily mean that you will be under anesthesia. It does mean that your caregivers will be able to manage anesthesia if you need it or if it occurs. It also means that you will be able to have a different type of anesthesia than you are having if you need it. When your procedure is complete, your caregivers will continue to watch your condition. They will make sure any medications wear off before you are allowed to go home.  Document Released: 04/14/2005 Document Revised: 11/13/2012 Document Reviewed: 08/30/2012 [facility] Patient Information 2014 [place], [place].

## 2013-08-21 NOTE — H&P (Signed)
[name] is an 58 y.o. female.   Chief Complaint: History of colon polyps HPI: This very pleasant 58 year old female presents for colonoscopic surveillance of an advanced adenoma removed in August of 2011 on a screening colonoscopy at that time. It was a sessile serrated adenoma with a maximum diameter of 12 mm. The patient has a history of ovarian cancer as well, as well as benign neutropenia, for which reason she received Neupogen prior to her previous exam, as well as Neupogen through her hematologist, Dr. Algis Greenhouse, on this occasion as well (a dose yesterday, and a dose today; she also took Levaquin yesterday and will take it again today). In addition, she will be receiving a Neupogen shots daily for the remainder of this week. . Her absolute neutrophil count this morning is 1900.  The patient does not have significant lower tract symptoms although she has had a recent softening of her stool attributed to one of her new diabetic medications.   Past Medical History  Diagnosis Date  . Hypothyroidism 12/14/2011  . DM type 2 (diabetes mellitus, type 2) 12/14/2011  . Benign essential HTN 12/14/2011  . Cancer     '06-Ovarian/ Uterine Cancer-chemotherapy  . Transfusion history     '06-s/p hysterectomy and staging  . Anemia     Planned Neupogen injections for chronic neutropenia    Past Surgical History  Procedure Laterality Date  . Appendectomy    . Abdominal hysterectomy      staging for Ovarian cancer  . Tonsillectomy    . Cholecystectomy      laparoscopic  . Eye surgery      pytosis of eyelids-child    History reviewed. No pertinent family history. Social History:  reports that she has never smoked. She does not have any smokeless tobacco history on file. She reports that she drinks alcohol. She reports that she does not use illicit drugs.  Allergies:  Allergies  Allergen Reactions  . Penicillins Swelling    Facial swelling  . Adhesive [Tape]     blisters  . Cefaclor  Rash  . Erythromycin     gastritis  . Trimethoprim Rash  . Sulfa Antibiotics Rash    Medications Prior to Admission  Medication Sig Dispense Refill  . aspirin 81 MG tablet Take 81 mg by mouth daily.      . Calcium Carbonate-Vitamin D (CALCIUM + D PO) Take 2 tablets by mouth daily.      . Canagliflozin (INVOKANA) 300 MG TABS Take 1 tablet by mouth every morning.       . Cholecalciferol (VITAMIN D3) 10000 UNITS capsule Take 10,000 Units by mouth daily.      . hydrochlorothiazide (HYDRODIURIL) 12.5 MG tablet Take 12.5 mg by mouth every morning.       [name] Kitchen levofloxacin (LEVAQUIN) 500 MG tablet Take 1 tablet (500 mg total) by mouth daily. Start 08/20/13 x 5 days.  5 tablet  0  . levothyroxine (SYNTHROID) 175 MCG tablet Take 175 mcg by mouth daily before breakfast.      . metFORMIN (GLUCOPHAGE) 1000 MG tablet Take 1,000 mg by mouth 2 (two) times daily with a meal.       . Multiple Vitamin (MULTIVITAMIN) capsule Take 1 capsule by mouth daily.      [name] Kitchen omega-3 acid ethyl esters (LOVAZA) 1 G capsule Take 1 g by mouth 2 (two) times daily.      [name] Kitchen omeprazole (PRILOSEC) 20 MG capsule Take 20 mg by mouth daily.      [name] Kitchen  rosuvastatin (CRESTOR) 10 MG tablet Take 10 mg by mouth every evening.       . sitaGLIPtin (JANUVIA) 100 MG tablet Take 100 mg by mouth daily.      . valsartan (DIOVAN) 320 MG tablet Take 320 mg by mouth every morning.        Results for orders placed in visit on 08/21/13 (from the past 48 hour(s))  CBC WITH DIFFERENTIAL     Status: Abnormal   Collection Time    08/21/13  8:35 AM      Result Value Range   WBC 4.2  3.9 - 10.3 10e3/uL   NEUT# 1.9  1.5 - 6.5 10e3/uL   HGB 12.9  11.6 - 15.9 g/dL   HCT 39.5  34.8 - 46.6 %   Platelets 284  145 - 400 10e3/uL   MCV 84.9  79.5 - 101.0 fL   MCH 27.8  25.1 - 34.0 pg   MCHC 32.7  31.5 - 36.0 g/dL   RBC 4.65  3.70 - 5.45 10e6/uL   RDW 16.3 (*) 11.2 - 14.5 %   lymph# 1.2  0.9 - 3.3 10e3/uL   MONO# 1.1 (*) 0.1 - 0.9 10e3/uL   Eosinophils  Absolute 0.0  0.0 - 0.5 10e3/uL   Basophils Absolute 0.1  0.0 - 0.1 10e3/uL   NEUT% 43.9  38.4 - 76.8 %   LYMPH% 28.0  14.0 - 49.7 %   MONO% 25.8 (*) 0.0 - 14.0 %   EOS% 1.0  0.0 - 7.0 %   BASO% 1.3  0.0 - 2.0 %   No results found.  ROS  Blood pressure 137/71, temperature 98.7 F (37.1 C), temperature source Oral, resp. rate 14, height 5\' 3"  (1.6 m), weight 119.75 kg (264 lb), SpO2 99.00%. Physical Exam   Assessment/Plan History of colon polyp, now due for surveillance.  Plan: Colonoscopy under propofol sedation by anesthesia today, and with Neupogen and antibiotic coverage as described above.  Toby Breithaupt V 08/21/2013, 10:46 AM

## 2013-08-22 ENCOUNTER — Encounter (HOSPITAL_COMMUNITY): Payer: Self-pay | Admitting: Gastroenterology

## 2013-08-22 ENCOUNTER — Ambulatory Visit (HOSPITAL_BASED_OUTPATIENT_CLINIC_OR_DEPARTMENT_OTHER): Payer: BC Managed Care – PPO

## 2013-08-22 VITALS — BP 136/67 | HR 78 | Temp 97.8°F

## 2013-08-22 DIAGNOSIS — D709 Neutropenia, unspecified: Secondary | ICD-10-CM

## 2013-08-22 DIAGNOSIS — D708 Other neutropenia: Secondary | ICD-10-CM

## 2013-08-22 MED ORDER — FILGRASTIM 480 MCG/0.8ML IJ SOLN
480.0000 ug | Freq: Once | INTRAMUSCULAR | Status: AC
  Administered 2013-08-22: 480 ug via SUBCUTANEOUS
  Filled 2013-08-22: qty 0.8

## 2013-08-22 NOTE — Patient Instructions (Signed)
Filgrastim, G-CSF injection What is this medicine? FILGRASTIM, G-CSF (fil GRA stim) stimulates the formation of white blood cells. This medicine is given to patients with conditions that may cause a decrease in white blood cells, like those receiving certain types of chemotherapy or bone marrow transplant. It helps the bone marrow recover its ability to produce white blood cells. Increasing the amount of white blood cells helps to decrease the risk of infection and fever. This medicine may be used for other purposes; ask your health care provider or pharmacist if you have questions. COMMON BRAND NAME(S): Neupogen What should I tell my health care provider before I take this medicine? They need to know if you have any of these conditions: -currently receiving radiation therapy -sickle cell disease -an unusual or allergic reaction to filgrastim, E. coli protein, other medicines, foods, dyes, or preservatives -pregnant or trying to get pregnant -breast-feeding How should I use this medicine? This medicine is for injection into a vein or injection under the skin. It is usually given by a health care professional in a hospital or clinic setting. If you get this medicine at home, you will be taught how to prepare and give this medicine. Always change the site for the injection under the skin. Let the solution warm to room temperature before you use it. Do not shake the solution before you withdraw a dose. Throw away any unused portion. Use exactly as directed. Take your medicine at regular intervals. Do not take your medicine more often than directed. It is important that you put your used needles and syringes in a special sharps container. Do not put them in a trash can. If you do not have a sharps container, call your pharmacist or healthcare provider to get one. Talk to your pediatrician regarding the use of this medicine in children. While this medicine may be prescribed for children for selected  conditions, precautions do apply. Overdosage: If you think you have taken too much of this medicine contact a poison control center or emergency room at once. NOTE: This medicine is only for you. Do not share this medicine with others. What if I miss a dose? Try not to miss doses. If you miss a dose take the dose as soon as you remember. If it is almost time for the next dose, do not take double doses unless told to by your doctor or health care professional. What may interact with this medicine? -lithium -medicines for cancer chemotherapy This list may not describe all possible interactions. Give your health care provider a list of all the medicines, herbs, non-prescription drugs, or dietary supplements you use. Also tell them if you smoke, drink alcohol, or use illegal drugs. Some items may interact with your medicine. What should I watch for while using this medicine? Visit your doctor or health care professional for regular checks on your progress. If you get a fever or any sign of infection while you are using this medicine, do not treat yourself. Check with your doctor or health care professional. Bone pain can usually be relieved by mild pain relievers such as acetaminophen or ibuprofen. Check with your doctor or health care professional before taking these medicines as they may hide a fever. Call your doctor or health care professional if the aches and pains are severe or do not go away. What side effects may I notice from receiving this medicine? Side effects that you should report to your doctor or health care professional as soon as possible: -allergic reactions   like skin rash, itching or hives, swelling of the face, lips, or tongue -difficulty breathing, wheezing -fever -pain, redness, or swelling at the injection site -stomach or side pain, or pain at the shoulder Side effects that usually do not require medical attention (report to your doctor or health care professional if they  continue or are bothersome): -bone pain (ribs, lower back, breast bone) -headache -skin rash This list may not describe all possible side effects. Call your doctor for medical advice about side effects. You may report side effects to FDA at 1-800-FDA-1088. Where should I keep my medicine? Keep out of the reach of children. Store in a refrigerator between 2 and 8 degrees C (36 and 46 degrees F). Do not freeze or leave in direct sunlight. If vials or syringes are left out of the refrigerator for more than 24 hours, they must be thrown away. Throw away unused vials after the expiration date on the carton. NOTE: This sheet is a summary. It may not cover all possible information. If you have questions about this medicine, talk to your doctor, pharmacist, or health care provider.  2014, Elsevier/Gold Standard. (2007-10-04 13:33:21)  

## 2013-08-23 ENCOUNTER — Ambulatory Visit (HOSPITAL_BASED_OUTPATIENT_CLINIC_OR_DEPARTMENT_OTHER): Payer: BC Managed Care – PPO

## 2013-08-23 VITALS — BP 139/57 | HR 79 | Temp 97.7°F

## 2013-08-23 DIAGNOSIS — D709 Neutropenia, unspecified: Secondary | ICD-10-CM

## 2013-08-23 DIAGNOSIS — D708 Other neutropenia: Secondary | ICD-10-CM

## 2013-08-23 MED ORDER — FILGRASTIM 480 MCG/0.8ML IJ SOLN
480.0000 ug | Freq: Once | INTRAMUSCULAR | Status: AC
  Administered 2013-08-23: 480 ug via SUBCUTANEOUS
  Filled 2013-08-23: qty 0.8

## 2013-08-23 NOTE — Patient Instructions (Signed)
Filgrastim, G-CSF injection What is this medicine? FILGRASTIM, G-CSF (fil GRA stim) stimulates the formation of white blood cells. This medicine is given to patients with conditions that may cause a decrease in white blood cells, like those receiving certain types of chemotherapy or bone marrow transplant. It helps the bone marrow recover its ability to produce white blood cells. Increasing the amount of white blood cells helps to decrease the risk of infection and fever. This medicine may be used for other purposes; ask your health care provider or pharmacist if you have questions. COMMON BRAND NAME(S): Neupogen What should I tell my health care provider before I take this medicine? They need to know if you have any of these conditions: -currently receiving radiation therapy -sickle cell disease -an unusual or allergic reaction to filgrastim, E. coli protein, other medicines, foods, dyes, or preservatives -pregnant or trying to get pregnant -breast-feeding How should I use this medicine? This medicine is for injection into a vein or injection under the skin. It is usually given by a health care professional in a hospital or clinic setting. If you get this medicine at home, you will be taught how to prepare and give this medicine. Always change the site for the injection under the skin. Let the solution warm to room temperature before you use it. Do not shake the solution before you withdraw a dose. Throw away any unused portion. Use exactly as directed. Take your medicine at regular intervals. Do not take your medicine more often than directed. It is important that you put your used needles and syringes in a special sharps container. Do not put them in a trash can. If you do not have a sharps container, call your pharmacist or healthcare provider to get one. Talk to your pediatrician regarding the use of this medicine in children. While this medicine may be prescribed for children for selected  conditions, precautions do apply. Overdosage: If you think you have taken too much of this medicine contact a poison control center or emergency room at once. NOTE: This medicine is only for you. Do not share this medicine with others. What if I miss a dose? Try not to miss doses. If you miss a dose take the dose as soon as you remember. If it is almost time for the next dose, do not take double doses unless told to by your doctor or health care professional. What may interact with this medicine? -lithium -medicines for cancer chemotherapy This list may not describe all possible interactions. Give your health care provider a list of all the medicines, herbs, non-prescription drugs, or dietary supplements you use. Also tell them if you smoke, drink alcohol, or use illegal drugs. Some items may interact with your medicine. What should I watch for while using this medicine? Visit your doctor or health care professional for regular checks on your progress. If you get a fever or any sign of infection while you are using this medicine, do not treat yourself. Check with your doctor or health care professional. Bone pain can usually be relieved by mild pain relievers such as acetaminophen or ibuprofen. Check with your doctor or health care professional before taking these medicines as they may hide a fever. Call your doctor or health care professional if the aches and pains are severe or do not go away. What side effects may I notice from receiving this medicine? Side effects that you should report to your doctor or health care professional as soon as possible: -allergic reactions   like skin rash, itching or hives, swelling of the face, lips, or tongue -difficulty breathing, wheezing -fever -pain, redness, or swelling at the injection site -stomach or side pain, or pain at the shoulder Side effects that usually do not require medical attention (report to your doctor or health care professional if they  continue or are bothersome): -bone pain (ribs, lower back, breast bone) -headache -skin rash This list may not describe all possible side effects. Call your doctor for medical advice about side effects. You may report side effects to FDA at 1-800-FDA-1088. Where should I keep my medicine? Keep out of the reach of children. Store in a refrigerator between 2 and 8 degrees C (36 and 46 degrees F). Do not freeze or leave in direct sunlight. If vials or syringes are left out of the refrigerator for more than 24 hours, they must be thrown away. Throw away unused vials after the expiration date on the carton. NOTE: This sheet is a summary. It may not cover all possible information. If you have questions about this medicine, talk to your doctor, pharmacist, or health care provider.  2014, Elsevier/Gold Standard. (2007-10-04 13:33:21)  

## 2013-08-24 ENCOUNTER — Ambulatory Visit: Payer: BLUE CROSS/BLUE SHIELD

## 2013-08-24 ENCOUNTER — Other Ambulatory Visit (HOSPITAL_BASED_OUTPATIENT_CLINIC_OR_DEPARTMENT_OTHER): Payer: BC Managed Care – PPO

## 2013-08-24 ENCOUNTER — Ambulatory Visit: Payer: BC Managed Care – PPO

## 2013-08-24 DIAGNOSIS — D708 Other neutropenia: Secondary | ICD-10-CM

## 2013-08-24 DIAGNOSIS — D709 Neutropenia, unspecified: Secondary | ICD-10-CM

## 2013-08-24 LAB — CBC WITH DIFFERENTIAL/PLATELET
BASO%: 0.8 % (ref 0.0–2.0)
Basophils Absolute: 0.3 10*3/uL — ABNORMAL HIGH (ref 0.0–0.1)
EOS ABS: 0.3 10*3/uL (ref 0.0–0.5)
EOS%: 1 % (ref 0.0–7.0)
HCT: 38.3 % (ref 34.8–46.6)
HGB: 12.4 g/dL (ref 11.6–15.9)
LYMPH#: 3.2 10*3/uL (ref 0.9–3.3)
LYMPH%: 10.4 % — AB (ref 14.0–49.7)
MCH: 27.5 pg (ref 25.1–34.0)
MCHC: 32.4 g/dL (ref 31.5–36.0)
MCV: 84.9 fL (ref 79.5–101.0)
MONO#: 2.6 10*3/uL — ABNORMAL HIGH (ref 0.1–0.9)
MONO%: 8.5 % (ref 0.0–14.0)
NEUT#: 24.2 10*3/uL — ABNORMAL HIGH (ref 1.5–6.5)
NEUT%: 79.3 % — ABNORMAL HIGH (ref 38.4–76.8)
Platelets: 275 10*3/uL (ref 145–400)
RBC: 4.51 10*6/uL (ref 3.70–5.45)
RDW: 16.3 % — AB (ref 11.2–14.5)
WBC: 30.6 10*3/uL — ABNORMAL HIGH (ref 3.9–10.3)

## 2013-08-24 MED ORDER — FILGRASTIM 480 MCG/0.8ML IJ SOLN: 480.0000 ug | Freq: Once | INTRAMUSCULAR | Status: DC

## 2013-08-24 NOTE — Progress Notes (Signed)
Labs today show ANC 24.2.   Neupogen held per Dr Beryle Beams.

## 2013-09-21 ENCOUNTER — Other Ambulatory Visit: Payer: Self-pay

## 2013-09-21 DIAGNOSIS — Z1231 Encounter for screening mammogram for malignant neoplasm of breast: Secondary | ICD-10-CM

## 2013-10-02 ENCOUNTER — Encounter: Payer: Self-pay | Admitting: Oncology

## 2013-10-08 ENCOUNTER — Ambulatory Visit
Admission: RE | Admit: 2013-10-08 | Discharge: 2013-10-08 | Disposition: A | Discharge status: Home / Self Care | Payer: BC Managed Care – PPO | Source: Ambulatory Visit

## 2013-10-08 DIAGNOSIS — Z1231 Encounter for screening mammogram for malignant neoplasm of breast: Secondary | ICD-10-CM

## 2014-01-11 ENCOUNTER — Telehealth: Payer: Self-pay | Admitting: *Deleted

## 2014-01-11 NOTE — Telephone Encounter (Signed)
Pt left VM states she missed call from our clinic.  Called pt back and left her a VM informing her of appts on Monday 6/15.  Please call back if any questions.

## 2014-01-14 ENCOUNTER — Encounter: Payer: Self-pay | Admitting: Hematology and Oncology

## 2014-01-14 ENCOUNTER — Telehealth: Payer: Self-pay | Admitting: Hematology and Oncology

## 2014-01-14 ENCOUNTER — Ambulatory Visit (HOSPITAL_BASED_OUTPATIENT_CLINIC_OR_DEPARTMENT_OTHER): Payer: BC Managed Care – PPO | Admitting: Hematology and Oncology

## 2014-01-14 ENCOUNTER — Other Ambulatory Visit (HOSPITAL_BASED_OUTPATIENT_CLINIC_OR_DEPARTMENT_OTHER): Payer: BC Managed Care – PPO

## 2014-01-14 ENCOUNTER — Encounter: Payer: Self-pay | Admitting: *Deleted

## 2014-01-14 ENCOUNTER — Other Ambulatory Visit: Payer: Self-pay | Admitting: Hematology and Oncology

## 2014-01-14 VITALS — BP 147/72 | HR 72 | Temp 98.7°F | Resp 18 | Ht 63.0 in | Wt 267.7 lb

## 2014-01-14 DIAGNOSIS — Z8543 Personal history of malignant neoplasm of ovary: Secondary | ICD-10-CM

## 2014-01-14 DIAGNOSIS — D708 Other neutropenia: Secondary | ICD-10-CM

## 2014-01-14 DIAGNOSIS — D709 Neutropenia, unspecified: Secondary | ICD-10-CM

## 2014-01-14 DIAGNOSIS — C569 Malignant neoplasm of unspecified ovary: Secondary | ICD-10-CM

## 2014-01-14 LAB — COMPREHENSIVE METABOLIC PANEL (CC13)
ALK PHOS: 68 U/L (ref 40–150)
ALT: 27 U/L (ref 0–55)
AST: 17 U/L (ref 5–34)
Albumin: 3.4 g/dL — ABNORMAL LOW (ref 3.5–5.0)
Anion Gap: 10 mEq/L (ref 3–11)
BILIRUBIN TOTAL: 0.38 mg/dL (ref 0.20–1.20)
BUN: 16.7 mg/dL (ref 7.0–26.0)
CO2: 29 mEq/L (ref 22–29)
Calcium: 9.7 mg/dL (ref 8.4–10.4)
Chloride: 102 mEq/L (ref 98–109)
Creatinine: 0.8 mg/dL (ref 0.6–1.1)
GLUCOSE: 166 mg/dL — AB (ref 70–140)
Potassium: 3.9 mEq/L (ref 3.5–5.1)
SODIUM: 141 meq/L (ref 136–145)
TOTAL PROTEIN: 7.3 g/dL (ref 6.4–8.3)

## 2014-01-14 LAB — CBC WITH DIFFERENTIAL/PLATELET
BASO%: 2.6 % — ABNORMAL HIGH (ref 0.0–2.0)
BASOS ABS: 0.1 10*3/uL (ref 0.0–0.1)
EOS%: 2.6 % (ref 0.0–7.0)
Eosinophils Absolute: 0.1 10*3/uL (ref 0.0–0.5)
HEMATOCRIT: 40.4 % (ref 34.8–46.6)
HEMOGLOBIN: 13 g/dL (ref 11.6–15.9)
LYMPH%: 68.6 % — ABNORMAL HIGH (ref 14.0–49.7)
MCH: 27.9 pg (ref 25.1–34.0)
MCHC: 32.2 g/dL (ref 31.5–36.0)
MCV: 86.7 fL (ref 79.5–101.0)
MONO#: 0.4 10*3/uL (ref 0.1–0.9)
MONO%: 18.8 % — AB (ref 0.0–14.0)
NEUT%: 7.4 % — ABNORMAL LOW (ref 38.4–76.8)
NRBC: 0 % (ref 0–0)
PLATELETS: 286 10*3/uL (ref 145–400)
RBC: 4.66 10*6/uL (ref 3.70–5.45)
RDW: 16.2 % — ABNORMAL HIGH (ref 11.2–14.5)
lymph#: 1.3 10*3/uL (ref 0.9–3.3)

## 2014-01-14 LAB — MORPHOLOGY: PLT EST: ADEQUATE

## 2014-01-14 NOTE — Assessment & Plan Note (Signed)
Clinically, she has no evidence of disease. Recommend observation only.

## 2014-01-14 NOTE — Assessment & Plan Note (Addendum)
Last bone marrow biopsy was 5 years ago. She continues persists and neutropenia. Recommend repeat bone marrow aspirate and biopsy and she agreed to proceed. I want to hold off giving her G-CSF until bone marrow biopsies obtained and explained the rationale to the patient and she understood.

## 2014-01-14 NOTE — Progress Notes (Signed)
BMBx scheduled for June 24 th at 8 am at [facility].  Notified pt of date/time and NPO after midnight,  Need driver home. Arrive at 7 am.  Notified Butch Penny in [facility].

## 2014-01-14 NOTE — Progress Notes (Signed)
[facility] FOLLOW-UP progress notes  Patient Care Team: Josetta Huddle, MD as PCP - General (Internal Medicine) [name], MD as Referring Physician (Obstetrics and Gynecology) Heath Lark, MD as Consulting Physician (Hematology and Oncology)  CHIEF COMPLAINTS/PURPOSE OF VISIT:  History of ovarian cancer and chronic neutropenia  HISTORY OF PRESENTING ILLNESS:  [name] 58 y.o. female was transferred to my care after her prior physician has left.  I reviewed the patient's records extensive and collaborated the history with the patient. 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 Going to the patient, initial diagnosis with ovarian cancer was when she presented with abdomen and no swelling and pelvic pain leading to her urgent care visit. She states that the tumor had caused torsion of the ovary. She cannot remember the stage of her disease. She began to develop leukopenia in approximately July of 2010. She underwent an initial bone marrow biopsy at [facility] in [place] on 03/17/2009. but it was nondiagnostic. No gross dysplastic changes, no excess blasts, normal cytogenetics.  Her counts have been stable over observation through this office since December 2010 with some minor fluctuations. Total white counts run as low as 1800 and as high as 2900. Percent neutrophils fluctuates very widely from as low as 4% recorded on a CBC done 02/03/2010 to as high as 31% with average being 20%. She was hospitalized in the ICU at that time  Both hemoglobin and platelets have remained stable over time.  Despite leukopenia, she has not had any recurrent or severe infections. She stated strong family history of lupus  in a cousin. She complained of occasional skin rash and joint stiffness. 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 She received G-CSF in January of 2015 to stimulate white blood cell production around a colonoscopy procedure a few years ago. She had a limited response.  She reports no interim medical problems. No recent infections. No abdominal pain, change in bowel habit, no vaginal bleeding. She denies any recent fever, chills, night sweats or abnormal weight loss She denies recent infection. MEDICAL HISTORY:  Past Medical History  Diagnosis Date  . Hypothyroidism 12/14/2011  . DM type 2 (diabetes mellitus, type 2) 12/14/2011  . Benign essential HTN 12/14/2011  . Cancer     '06-Ovarian/ Uterine Cancer-chemotherapy  . Transfusion history     '06-s/p hysterectomy and staging  . Anemia     Planned Neupogen injections for chronic neutropenia    SURGICAL HISTORY: Past Surgical History  Procedure Laterality Date  . Appendectomy    . Abdominal hysterectomy      staging for Ovarian cancer  . Tonsillectomy    . Cholecystectomy      laparoscopic  . Eye surgery      pytosis of eyelids-child  . Colonoscopy with propofol N/A 08/21/2013    Procedure: COLONOSCOPY WITH PROPOFOL;  Surgeon: Cleotis Nipper, MD;  Location: [facility];  Service: Endoscopy;  Laterality: N/A;    SOCIAL HISTORY: History   Social History  . Marital Status: Married    Spouse Name: N/A    Number of Children: N/A  . Years of Education: N/A   Occupational History  . Not on file.   Social History Main Topics  . Smoking status: Never Smoker   .  Smokeless tobacco: Never Used  . Alcohol Use: Yes     Comment: rare social  . Drug Use: No  . Sexual Activity: Not Currently   Other Topics Concern  . Not on file   Social History Narrative  . No narrative on file    FAMILY HISTORY: Family History  Problem Relation Age of Onset  . Cancer Mother     stomach ca  . Cancer Father      prostate ca    ALLERGIES:  is allergic to penicillins; adhesive; cefaclor; erythromycin; trimethoprim; and sulfa antibiotics.  MEDICATIONS:  Current Outpatient Prescriptions  Medication Sig Dispense Refill  . aspirin 81 MG tablet Take 81 mg by mouth daily.      . Calcium Carbonate-Vitamin D (CALCIUM + D PO) Take 2 tablets by mouth daily.      . Canagliflozin (INVOKANA) 300 MG TABS Take 1 tablet by mouth every morning.       . Cholecalciferol (VITAMIN D3) 10000 UNITS capsule Take 10,000 Units by mouth once a week.       . hydrochlorothiazide (HYDRODIURIL) 12.5 MG tablet Take 12.5 mg by mouth every morning.       [name] Kitchen levothyroxine (SYNTHROID) 175 MCG tablet Take 175 mcg by mouth daily before breakfast.      . metFORMIN (GLUCOPHAGE) 1000 MG tablet Take 1,000 mg by mouth 2 (two) times daily with a meal.       . Multiple Vitamin (MULTIVITAMIN) capsule Take 1 capsule by mouth daily.      [name] Kitchen omega-3 acid ethyl esters (LOVAZA) 1 G capsule Take 1 g by mouth 2 (two) times daily.      [name] Kitchen omeprazole (PRILOSEC) 20 MG capsule Take 20 mg by mouth daily.      . rosuvastatin (CRESTOR) 10 MG tablet Take 10 mg by mouth every evening.       . sitaGLIPtin (JANUVIA) 100 MG tablet Take 100 mg by mouth daily.      . valsartan (DIOVAN) 320 MG tablet Take 320 mg by mouth every morning.       No current facility-administered medications for this visit.    REVIEW OF SYSTEMS:   Constitutional: Denies fevers, chills or abnormal night sweats.  Eyes: Denies blurriness of vision, double vision or watery eyes Ears, nose, mouth, throat, and face: Denies mucositis or sore throat Respiratory: Denies cough, dyspnea or wheezes Cardiovascular: Denies palpitation, chest discomfort or lower extremity swelling Gastrointestinal:  Denies nausea, heartburn or change in bowel habits Lymphatics: Denies new lymphadenopathy or easy bruising Neurological:Denies numbness, tingling or new weaknesses Behavioral/Psych: Mood is stable, no  new changes  All other systems were reviewed with the patient and are negative.  PHYSICAL EXAMINATION: ECOG PERFORMANCE STATUS: 1 - Symptomatic but completely ambulatory  Filed Vitals:   01/14/14 1201  BP: 147/72  Pulse: 72  Temp: 98.7 F (37.1 C)  Resp: 18   Filed Weights   01/14/14 1201  Weight: 267 lb 11.2 oz (121.428 kg)    GENERAL:alert, no distress and comfortable. She is morbidly obese SKIN: skin color, texture, turgor are normal, no rashes or significant lesions EYES: normal, conjunctiva are pink and non-injected, sclera clear OROPHARYNX:no exudate, normal lips, buccal mucosa, and tongue  NECK: supple, thyroid normal size, non-tender, without nodularity LYMPH:  no palpable lymphadenopathy in the cervical, axillary or inguinal LUNGS: clear to auscultation and percussion with normal breathing effort HEART: regular rate & rhythm and no murmurs without lower extremity edema ABDOMEN:abdomen soft, non-tender and normal bowel  sounds. Well-healed surgical scar. Limited abdominal exam due to morbid obesity Musculoskeletal:no cyanosis of digits and no clubbing  PSYCH: alert & oriented x 3 with fluent speech NEURO: no focal motor/sensory deficits  LABORATORY DATA:  I have reviewed the data as listed Lab Results  Component Value Date   WBC 1.9 Repeated and Verified* 01/14/2014   HGB 13.0 01/14/2014   HCT 40.4 01/14/2014   MCV 86.7 01/14/2014   PLT 286 01/14/2014    Recent Labs  01/14/14 1151  NA 141  K 3.9  CO2 29  GLUCOSE 166*  BUN 16.7  CREATININE 0.8  CALCIUM 9.7  PROT 7.3  ALBUMIN 3.4*  AST 17  ALT 27  ALKPHOS 68  BILITOT 0.38    ASSESSMENT & PLAN:  History of ovarian cancer Clinically, she has no evidence of disease. Recommend observation only.  Chronic neutropenia Last bone marrow biopsy was 5 years ago. She continues persists and neutropenia. Recommend repeat bone marrow aspirate and biopsy and she agreed to proceed. I want to hold off giving her  G-CSF until bone marrow biopsies obtained and explained the rationale to the patient and she understood.   I plan to order a screening test to exclude autoimmune disease and T-cell receptor rearrangement studies to exclude LGL leukemia.  Orders Placed This Encounter  Procedures  . ANA    Standing Status: Future     Number of Occurrences: 1     Standing Expiration Date: 01/14/2015  . Cyclic citrul peptide antibody, IgG    Standing Status: Future     Number of Occurrences: 1     Standing Expiration Date: 01/14/2015  . Francee Nodal Test    Standing Status: Future     Number of Occurrences: 1     Standing Expiration Date: 01/14/2015    Order Specific Question:  Test Name:    Answer:  FOr LGL leukemia, Tcell receptor rearrangement  . CBC with Differential    Standing Status: Standing     Number of Occurrences: 3     Standing Expiration Date: 01/15/2015  . T Cell Lymphoma, PCR    Standing Status: Future     Number of Occurrences: 1     Standing Expiration Date: 01/14/2015    All questions were answered. The patient knows to call the clinic with any problems, questions or concerns.    [facility], Excell Neyland, MD 01/14/2014 9:41 PM

## 2014-01-14 NOTE — Telephone Encounter (Signed)
, °

## 2014-01-15 LAB — ANA: Anti Nuclear Antibody(ANA): NEGATIVE

## 2014-01-15 LAB — CA 125: CA 125: 6.8 U/mL (ref 0.0–30.2)

## 2014-01-15 LAB — CYCLIC CITRUL PEPTIDE ANTIBODY, IGG: Cyclic Citrullin Peptide Ab: <2 U/mL (ref 0.0–5.0)

## 2014-01-21 ENCOUNTER — Encounter (HOSPITAL_COMMUNITY): Payer: Self-pay | Admitting: Pharmacy Technician

## 2014-01-23 ENCOUNTER — Other Ambulatory Visit: Payer: Self-pay | Admitting: Hematology and Oncology

## 2014-01-23 ENCOUNTER — Ambulatory Visit (HOSPITAL_BASED_OUTPATIENT_CLINIC_OR_DEPARTMENT_OTHER): Payer: BC Managed Care – PPO

## 2014-01-23 ENCOUNTER — Ambulatory Visit (HOSPITAL_COMMUNITY)
Admission: RE | Admit: 2014-01-23 | Discharge: 2014-01-23 | Disposition: A | Discharge status: Home / Self Care | Payer: BC Managed Care – PPO | Source: Ambulatory Visit | Attending: Hematology and Oncology | Admitting: Hematology and Oncology

## 2014-01-23 ENCOUNTER — Encounter (HOSPITAL_COMMUNITY): Payer: Self-pay

## 2014-01-23 VITALS — BP 116/68 | HR 66 | Temp 97.8°F | Resp 20 | Ht 63.0 in | Wt 261.0 lb

## 2014-01-23 DIAGNOSIS — D708 Other neutropenia: Secondary | ICD-10-CM

## 2014-01-23 DIAGNOSIS — D709 Neutropenia, unspecified: Secondary | ICD-10-CM

## 2014-01-23 DIAGNOSIS — D704 Cyclic neutropenia: Secondary | ICD-10-CM | POA: Insufficient documentation

## 2014-01-23 LAB — GLUCOSE, CAPILLARY: Glucose-Capillary: 118 mg/dL — ABNORMAL HIGH (ref 70–99)

## 2014-01-23 LAB — CBC
HCT: 38.9 % (ref 36.0–46.0)
Hemoglobin: 12.4 g/dL (ref 12.0–15.0)
MCH: 27.3 pg (ref 26.0–34.0)
MCHC: 31.9 g/dL (ref 30.0–36.0)
MCV: 85.5 fL (ref 78.0–100.0)
PLATELETS: 302 10*3/uL (ref 150–400)
RBC: 4.55 MIL/uL (ref 3.87–5.11)
RDW: 15.7 % — ABNORMAL HIGH (ref 11.5–15.5)
WBC: 2 10*3/uL — ABNORMAL LOW (ref 4.0–10.5)

## 2014-01-23 LAB — BONE MARROW EXAM

## 2014-01-23 MED ORDER — SODIUM CHLORIDE 0.9 % IV SOLN
INTRAVENOUS | Status: DC
  Administered 2014-01-23: 07:00:00 via INTRAVENOUS

## 2014-01-23 MED ORDER — MIDAZOLAM HCL 2 MG/2ML IJ SOLN
INTRAMUSCULAR | Status: AC | PRN
  Administered 2014-01-23: 2 mg via INTRAVENOUS
  Administered 2014-01-23: 6 mg via INTRAVENOUS

## 2014-01-23 MED ORDER — MIDAZOLAM HCL 10 MG/2ML IJ SOLN
10.0000 mg | Freq: Once | INTRAMUSCULAR | Status: DC
  Filled 2014-01-23: qty 2

## 2014-01-23 MED ORDER — MORPHINE SULFATE 10 MG/ML IJ SOLN
INTRAMUSCULAR | Status: AC | PRN
  Administered 2014-01-23: 6 mg via INTRAVENOUS
  Administered 2014-01-23: 2 mg via INTRAVENOUS

## 2014-01-23 MED ORDER — TBO-FILGRASTIM 480 MCG/0.8ML ~~LOC~~ SOSY
480.0000 ug | PREFILLED_SYRINGE | Freq: Every day | SUBCUTANEOUS | Status: DC
  Administered 2014-01-23: 480 ug via SUBCUTANEOUS
  Filled 2014-01-23: qty 0.8

## 2014-01-23 MED ORDER — MORPHINE SULFATE 10 MG/ML IJ SOLN
10.0000 mg | Freq: Once | INTRAMUSCULAR | Status: DC
  Filled 2014-01-23: qty 1

## 2014-01-23 NOTE — Discharge Instructions (Signed)
Conscious Sedation, Adult, Care After °Refer to this sheet in the next few weeks. These instructions provide you with information on caring for yourself after your procedure. Your health care provider may also give you more specific instructions. Your treatment has been planned according to current medical practices, but problems sometimes occur. Call your health care provider if you have any problems or questions after your procedure. °WHAT TO EXPECT AFTER THE PROCEDURE  °After your procedure: °· You may feel sleepy, clumsy, and have poor balance for several hours. °· Vomiting may occur if you eat too soon after the procedure. °HOME CARE INSTRUCTIONS °· Do not participate in any activities where you could become injured for at least 24 hours. Do not: °· Drive. °· Swim. °· Ride a bicycle. °· Operate heavy machinery. °· Cook. °· Use power tools. °· Climb ladders. °· Work from a high place. °· Do not make important decisions or sign legal documents until you are improved. °· If you vomit, drink water, juice, or soup when you can drink without vomiting. Make sure you have little or no nausea before eating solid foods. °· Only take over-the-counter or prescription medicines for pain, discomfort, or fever as directed by your health care provider. °· Make sure you and your family fully understand everything about the medicines given to you, including what side effects may occur. °· You should not drink alcohol, take sleeping pills, or take medicines that cause drowsiness for at least 24 hours. °· If you smoke, do not smoke without supervision. °· If you are feeling better, you may resume normal activities 24 hours after you were sedated. °· Keep all appointments with your health care provider. °SEEK MEDICAL CARE IF: °· Your skin is pale or bluish in color. °· You continue to feel nauseous or vomit. °· Your pain is getting worse and is not helped by medicine. °· You have bleeding or swelling. °· You are still sleepy or  feeling clumsy after 24 hours. °SEEK IMMEDIATE MEDICAL CARE IF: °· You develop a rash. °· You have difficulty breathing. °· You develop any type of allergic problem. °· You have a fever. °MAKE SURE YOU: °· Understand these instructions. °· Will watch your condition. °· Will get help right away if you are not doing well or get worse. °Document Released: 05/09/2013 Document Reviewed: 05/09/2013 °[employer]® Patient Information ©2015 [employer]. This information is not intended to replace advice given to you by your health care provider. Make sure you discuss any questions you have with your health care provider. ° °Bone Marrow Aspiration, Bone Marrow Biopsy °Care After °Read the instructions outlined below and refer to this sheet in the next few weeks. These discharge instructions provide you with general information on caring for yourself after you leave the hospital. Your caregiver may also give you specific instructions. While your treatment has been planned according to the most current medical practices available, unavoidable complications occasionally occur. If you have any problems or questions after discharge, call your caregiver. °FINDING OUT THE RESULTS OF YOUR TEST °Not all test results are available during your visit. If your test results are not back during the visit, make an appointment with your caregiver to find out the results. Do not assume everything is normal if you have not heard from your caregiver or the medical facility. It is important for you to follow up on all of your test results.  °HOME CARE INSTRUCTIONS  °You have had sedation and may be sleepy or dizzy. Your thinking   not be as clear as usual. For the next 24 hours:  Only take over-the-counter or prescription medicines for pain, discomfort, and or fever as directed by your caregiver.  Do not drink alcohol.  Do not smoke.  Do not drive.  Do not make important legal decisions.  Do not operate heavy machinery.  Do not care  for small children by yourself.  Keep your dressing clean and dry. You may replace dressing with a bandage after 24 hours.  You may take a bath or shower after 24 hours.  Use an ice pack for 20 minutes every 2 hours while awake for pain as needed. SEEK MEDICAL CARE IF:   There is redness, swelling, or increasing pain at the biopsy site.  There is pus coming from the biopsy site.  There is drainage from a biopsy site lasting longer than one day.  An unexplained oral temperature above 102 F (38.9 C) develops. SEEK IMMEDIATE MEDICAL CARE IF:   You develop a rash.  You have difficulty breathing.  You develop any reaction or side effects to medications given. Document Released: 02/05/2005 Document Revised: 10/11/2011 Document Reviewed: 07/16/2008 [facility] Patient Information 2015 [place], [place]. This information is not intended to replace advice given to you by your health care provider. Make sure you discuss any questions you have with your health care provider.

## 2014-01-23 NOTE — Procedures (Signed)
Brief examination was performed. ENT: adequate airway clearance Heart: regular rate and rhythm.No Murmurs Lungs: clear to auscultation, no wheezes, normal respiratory effort  American Society of Anesthesiologists ASA scale 1  Mallampati Score of 1  Bone Marrow Biopsy and Aspiration Procedure Note   Informed consent was obtained and potential risks including bleeding, infection and pain were reviewed with the patient. I verified that the patient has been fasting since midnight.  The patient's name, date of birth, identification, consent and allergies were verified prior to the start of procedure and time out was performed.  A total of 68m of IV Versed and 8 mg of IV morphine were given.  The right posterior iliac crest was chosen as the site of biopsy.  The skin was prepped with Betadine solution.   8 cc of 1% lidocaine was used to provide local anaesthesia.   10 cc of bone marrow aspirate was obtained followed by 1 inch biopsy.   The procedure was tolerated well and there were no complications.  The patient was stable at the end of the procedure.  Specimens sent for flow cytometry, cytogenetics and additional studies.

## 2014-01-23 NOTE — Patient Instructions (Signed)
Tbo-Filgrastim injection What is this medicine? TBO-FILGRASTIM is used to help decrease the time you have low amounts of white blood cells after cancer treatment. It helps the body make more white blood cells. Increasing the amount of white blood cells helps to decrease the risk of infection and fever. This medicine may be used for other purposes; ask your health care provider or pharmacist if you have questions. COMMON BRAND NAME(S): Granix What should I tell my health care provider before I take this medicine? They need to know if you have any of these conditions: -history of blood diseases, like sickle cell anemia or leukemia -an unusual or allergic reaction to tbo-filgrastim, filgrastim, pegfilgrastim, other medicines, foods, dyes, or preservatives -pregnant or trying to get pregnant -breast-feeding How should I use this medicine? This medicine is for injection under the skin. It is given by a health care professional in a hospital or clinic setting. Talk to your pediatrician regarding the use of this medicine in children. Special care may be needed. Overdosage: If you think you've taken too much of this medicine contact a poison control center or emergency room at once. Overdosage: If you think you have taken too much of this medicine contact a poison control center or emergency room at once. NOTE: This medicine is only for you. Do not share this medicine with others. What if I miss a dose? Keep appointments for follow-up doses as directed. It is important not to miss your dose. Call your doctor or health care professional if you are unable to keep an appointment. What may interact with this medicine? -lithium This list may not describe all possible interactions. Give your health care provider a list of all the medicines, herbs, non-prescription drugs, or dietary supplements you use. Also tell them if you smoke, drink alcohol, or use illegal drugs. Some items may interact with your  medicine. What should I watch for while using this medicine? Your condition will be monitored carefully while you are receiving this medicine. You may need blood work done while you are taking this medicine. Avoid taking products that contain aspirin, acetaminophen, ibuprofen, naproxen, or ketoprofen unless instructed by your doctor. These medicines may hide a fever. Call your doctor or health care professional for advice if you get a fever, chills or sore throat, or other symptoms of a cold or flu. Do not treat yourself. What side effects may I notice from receiving this medicine? Side effects that you should report to your doctor or health care professional as soon as possible: -allergic reactions like skin rash, itching or hives, swelling of the face, lips, or tongue -breathing problems -fever -stomach pain Side effects that usually do not require medical attention (Report these to your doctor or health care professional if they continue or are bothersome.): -bone pain This list may not describe all possible side effects. Call your doctor for medical advice about side effects. You may report side effects to FDA at 1-800-FDA-1088. Where should I keep my medicine? This drug is given in a hospital or clinic and will not be stored at home. NOTE: This sheet is a summary. It may not cover all possible information. If you have questions about this medicine, talk to your doctor, pharmacist, or health care provider.  2015, Elsevier/Gold Standard. (2011-04-07 16:41:24)  

## 2014-01-24 ENCOUNTER — Ambulatory Visit (HOSPITAL_BASED_OUTPATIENT_CLINIC_OR_DEPARTMENT_OTHER): Payer: BC Managed Care – PPO

## 2014-01-24 VITALS — BP 142/66 | HR 74 | Temp 98.2°F

## 2014-01-24 DIAGNOSIS — D708 Other neutropenia: Secondary | ICD-10-CM

## 2014-01-24 DIAGNOSIS — D709 Neutropenia, unspecified: Secondary | ICD-10-CM

## 2014-01-24 MED ORDER — TBO-FILGRASTIM 480 MCG/0.8ML ~~LOC~~ SOSY
480.0000 ug | PREFILLED_SYRINGE | Freq: Every day | SUBCUTANEOUS | Status: DC
  Administered 2014-01-24: 480 ug via SUBCUTANEOUS
  Filled 2014-01-24: qty 0.8

## 2014-01-25 ENCOUNTER — Ambulatory Visit (HOSPITAL_BASED_OUTPATIENT_CLINIC_OR_DEPARTMENT_OTHER): Payer: BC Managed Care – PPO

## 2014-01-25 VITALS — BP 153/64 | HR 70 | Temp 98.1°F

## 2014-01-25 DIAGNOSIS — D708 Other neutropenia: Secondary | ICD-10-CM

## 2014-01-25 DIAGNOSIS — D709 Neutropenia, unspecified: Secondary | ICD-10-CM

## 2014-01-25 MED ORDER — TBO-FILGRASTIM 480 MCG/0.8ML ~~LOC~~ SOSY
480.0000 ug | PREFILLED_SYRINGE | Freq: Every day | SUBCUTANEOUS | Status: DC
  Administered 2014-01-25: 300 ug via SUBCUTANEOUS
  Filled 2014-01-25: qty 0.8

## 2014-01-28 ENCOUNTER — Other Ambulatory Visit (HOSPITAL_BASED_OUTPATIENT_CLINIC_OR_DEPARTMENT_OTHER): Payer: BC Managed Care – PPO

## 2014-01-28 ENCOUNTER — Telehealth: Payer: Self-pay | Admitting: Hematology and Oncology

## 2014-01-28 ENCOUNTER — Telehealth: Payer: Self-pay | Admitting: *Deleted

## 2014-01-28 ENCOUNTER — Telehealth: Payer: Self-pay | Admitting: Nurse Practitioner

## 2014-01-28 DIAGNOSIS — D708 Other neutropenia: Secondary | ICD-10-CM

## 2014-01-28 DIAGNOSIS — D709 Neutropenia, unspecified: Secondary | ICD-10-CM

## 2014-01-28 LAB — CBC WITH DIFFERENTIAL/PLATELET
BASO%: 1 % (ref 0.0–2.0)
Basophils Absolute: 0.1 10*3/uL (ref 0.0–0.1)
EOS%: 1.7 % (ref 0.0–7.0)
Eosinophils Absolute: 0.2 10*3/uL (ref 0.0–0.5)
HCT: 40.4 % (ref 34.8–46.6)
HGB: 13 g/dL (ref 11.6–15.9)
LYMPH%: 19.6 % (ref 14.0–49.7)
MCH: 27.6 pg (ref 25.1–34.0)
MCHC: 32.1 g/dL (ref 31.5–36.0)
MCV: 86 fL (ref 79.5–101.0)
MONO#: 1 10*3/uL — ABNORMAL HIGH (ref 0.1–0.9)
MONO%: 10.2 % (ref 0.0–14.0)
NEUT#: 6.6 10*3/uL — ABNORMAL HIGH (ref 1.5–6.5)
NEUT%: 67.5 % (ref 38.4–76.8)
PLATELETS: 261 10*3/uL (ref 145–400)
RBC: 4.7 10*6/uL (ref 3.70–5.45)
RDW: 16.5 % — ABNORMAL HIGH (ref 11.2–14.5)
WBC: 9.8 10*3/uL (ref 3.9–10.3)
lymph#: 1.9 10*3/uL (ref 0.9–3.3)

## 2014-01-28 NOTE — Telephone Encounter (Signed)
Phone note opened in error.  

## 2014-01-28 NOTE — Telephone Encounter (Signed)
Patient notified that WBC is back to normal range per MD. Pt encouraged to keep her next appointment on this Wednesday 01/30/14. Patient verbalizes understanding.

## 2014-01-28 NOTE — Telephone Encounter (Signed)
cld & spoke w/pt to adv of new time & date-pt agreed and stated would be here

## 2014-01-30 ENCOUNTER — Other Ambulatory Visit (HOSPITAL_BASED_OUTPATIENT_CLINIC_OR_DEPARTMENT_OTHER): Payer: BC Managed Care – PPO

## 2014-01-30 ENCOUNTER — Telehealth: Payer: Self-pay | Admitting: Hematology and Oncology

## 2014-01-30 ENCOUNTER — Encounter: Payer: Self-pay | Admitting: Hematology and Oncology

## 2014-01-30 ENCOUNTER — Ambulatory Visit (HOSPITAL_BASED_OUTPATIENT_CLINIC_OR_DEPARTMENT_OTHER): Payer: BC Managed Care – PPO | Admitting: Hematology and Oncology

## 2014-01-30 VITALS — BP 140/64 | HR 70 | Temp 97.2°F | Resp 18 | Ht 63.0 in | Wt 265.9 lb

## 2014-01-30 DIAGNOSIS — D709 Neutropenia, unspecified: Secondary | ICD-10-CM

## 2014-01-30 DIAGNOSIS — D708 Other neutropenia: Secondary | ICD-10-CM

## 2014-01-30 DIAGNOSIS — Z8543 Personal history of malignant neoplasm of ovary: Secondary | ICD-10-CM

## 2014-01-30 LAB — CBC WITH DIFFERENTIAL/PLATELET
BASO%: 1.3 % (ref 0.0–2.0)
Basophils Absolute: 0.1 10*3/uL (ref 0.0–0.1)
EOS%: 2.1 % (ref 0.0–7.0)
Eosinophils Absolute: 0.1 10*3/uL (ref 0.0–0.5)
HCT: 42.2 % (ref 34.8–46.6)
HGB: 13.4 g/dL (ref 11.6–15.9)
LYMPH%: 37.2 % (ref 14.0–49.7)
MCH: 27.1 pg (ref 25.1–34.0)
MCHC: 31.6 g/dL (ref 31.5–36.0)
MCV: 85.7 fL (ref 79.5–101.0)
MONO#: 0.7 10*3/uL (ref 0.1–0.9)
MONO%: 13.1 % (ref 0.0–14.0)
NEUT%: 46.3 % (ref 38.4–76.8)
NEUTROS ABS: 2.3 10*3/uL (ref 1.5–6.5)
Platelets: 238 10*3/uL (ref 145–400)
RBC: 4.92 10*6/uL (ref 3.70–5.45)
RDW: 16.4 % — ABNORMAL HIGH (ref 11.2–14.5)
WBC: 5 10*3/uL (ref 3.9–10.3)
lymph#: 1.9 10*3/uL (ref 0.9–3.3)

## 2014-01-30 MED ORDER — LEVOFLOXACIN 500 MG PO TABS: 500.0000 mg | ORAL_TABLET | Freq: Every day | ORAL | Status: DC

## 2014-01-30 MED ORDER — FLUCONAZOLE 100 MG PO TABS: 100.0000 mg | ORAL_TABLET | Freq: Every day | ORAL | Status: DC

## 2014-01-30 NOTE — Assessment & Plan Note (Signed)
Clinically, she has no evidence of disease.

## 2014-01-30 NOTE — Assessment & Plan Note (Signed)
Repeat bone marrow biopsy and T-cell rearrangement study excluded LGL. The patient is not symptomatic. I discussed with her neutropenic precaution. I will review her bone marrow biopsy next week at the hematology tumor board. I will see the patient twice a year with blood work, history and physical examination. The patient is educated to watch out for signs and symptoms of disease. She is recommended to receive yearly influenza vaccination and to start pneumococcal vaccination. I gave her a prescription of fluconazole and levofloxacin to hang onto due to risk of recurrent yeast infection and bacteria infection. The patient is recommended to call me first if possible for evaluation prior to starting dose antimicrobial therapies.

## 2014-01-30 NOTE — Progress Notes (Signed)
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 Going to the patient, initial diagnosis with ovarian cancer was when she presented with abdomen and no swelling and pelvic pain leading to her urgent care visit. She states that the tumor had caused torsion of the ovary. She cannot remember the stage of her disease. She began to develop leukopenia in approximately July of 2010. She underwent an initial bone marrow biopsy at [facility] in [place] on 03/17/2009. but it was nondiagnostic. No gross dysplastic changes, no excess blasts, normal cytogenetics.  Her counts have been stable over observation through this office since December 2010 with some minor fluctuations. Total white counts run as low as 1800 and as high as 2900. Percent neutrophils fluctuates very widely from as low as 4% recorded on a CBC done 02/03/2010 to as high as 31% with average being 20%. She was hospitalized in the ICU at that time  Both hemoglobin and platelets have remained stable over time.  Despite leukopenia, she has not had any recurrent or severe infections. She stated strong family history of lupus in a cousin. She complained of occasional skin rash and joint stiffness. 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 She received G-CSF in January of 2015 to stimulate white blood cell production around a colonoscopy procedure a few years ago. She had a limited response. On 01/23/2014, repeat bone marrow biopsy excluded lymphoma or leukemia. INTERVAL HISTORY: [name] 58 y.o. female returns for further followup. She complained of some bone pain related to G-CSF  injection.  She reports no interim medical problems. No recent infections. No abdominal pain, change in bowel habit, no vaginal bleeding. She denies any recent fever, chills, night sweats or abnormal weight loss She denies recent infection.   I have reviewed the past medical history, past surgical history, social history and family history with the patient and they are unchanged from previous note.  ALLERGIES:  is allergic to penicillins; adhesive; cefaclor; erythromycin; trimethoprim; and sulfa antibiotics.  MEDICATIONS:  Current Outpatient Prescriptions  Medication Sig Dispense Refill  . aspirin EC 81 MG tablet Take 81 mg by mouth daily.      . Calcium Carbonate-Vitamin D (CALCIUM + D PO) Take 2 tablets by mouth daily.      . Canagliflozin (INVOKANA) 300 MG TABS Take 1 tablet by mouth every morning.       . Cholecalciferol (VITAMIN D3) 10000 UNITS capsule Take 10,000 Units by mouth once a week. Sundays      . hydrochlorothiazide (HYDRODIURIL) 12.5 MG tablet Take 12.5 mg by mouth every morning.       [name] Kitchen levothyroxine (SYNTHROID) 175 MCG tablet Take 175 mcg by mouth daily before breakfast.      . metFORMIN (GLUCOPHAGE) 1000 MG tablet Take 1,000 mg by mouth 2 (two) times daily with a meal.       . Multiple Vitamin (MULTIVITAMIN WITH MINERALS) TABS tablet Take 1 tablet by mouth daily.      . naproxen sodium (ALEVE) 220 MG tablet Take 220 mg by mouth at bedtime as needed (Pain).      [name] Kitchen omega-3 acid ethyl esters (LOVAZA) 1 G capsule Take 1 g by mouth 2 (two) times daily.      [name] Kitchen  omeprazole (PRILOSEC) 20 MG capsule Take 20 mg by mouth daily.      Vladimir Faster Glycol-Propyl Glycol (SYSTANE OP) Apply 1 drop to eye at bedtime.      . rosuvastatin (CRESTOR) 10 MG tablet Take 10 mg by mouth every evening.       . sitaGLIPtin (JANUVIA) 100 MG tablet Take 100 mg by mouth daily.      . valsartan (DIOVAN) 320 MG tablet Take 320 mg by mouth every morning.      . fluconazole (DIFLUCAN) 100 MG tablet Take 1  tablet (100 mg total) by mouth daily.  10 tablet  0  . levofloxacin (LEVAQUIN) 500 MG tablet Take 1 tablet (500 mg total) by mouth daily.  10 tablet  0   No current facility-administered medications for this visit.     REVIEW OF SYSTEMS:   Constitutional: Denies fevers, chills or night sweats Eyes: Denies blurriness of vision Ears, nose, mouth, throat, and face: Denies mucositis or sore throat Respiratory: Denies cough, dyspnea or wheezes Cardiovascular: Denies palpitation, chest discomfort or lower extremity swelling Gastrointestinal:  Denies nausea, heartburn or change in bowel habits Skin: Denies abnormal skin rashes Lymphatics: Denies new lymphadenopathy or easy bruising Neurological:Denies numbness, tingling or new weaknesses Behavioral/Psych: Mood is stable, no new changes  All other systems were reviewed with the patient and are negative.  PHYSICAL EXAMINATION: ECOG PERFORMANCE STATUS: 0 - Asymptomatic  Filed Vitals:   01/30/14 1421  BP: 140/64  Pulse: 70  Temp: 97.2 F (36.2 C)  Resp: 18   Filed Weights   01/30/14 1421  Weight: 265 lb 14.4 oz (120.611 kg)    GENERAL:alert, no distress and comfortable. She is morbidly obese SKIN: skin color, texture, turgor are normal, no rashes or significant lesions EYES: normal, Conjunctiva are pink and non-injected, sclera clear. Noted ptosis  NEURO: alert & oriented x 3 with fluent speech, no focal motor/sensory deficits  LABORATORY DATA:  I have reviewed the data as listed Results for orders placed in visit on 01/30/14 (from the past 48 hour(s))  CBC WITH DIFFERENTIAL     Status: Abnormal   Collection Time    01/30/14  2:10 PM      Result Value Ref Range   WBC 5.0  3.9 - 10.3 10e3/uL   NEUT# 2.3  1.5 - 6.5 10e3/uL   HGB 13.4  11.6 - 15.9 g/dL   HCT 42.2  34.8 - 46.6 %   Platelets 238  145 - 400 10e3/uL   MCV 85.7  79.5 - 101.0 fL   MCH 27.1  25.1 - 34.0 pg   MCHC 31.6  31.5 - 36.0 g/dL   RBC 4.92  3.70 - 5.45  10e6/uL   RDW 16.4 (*) 11.2 - 14.5 %   lymph# 1.9  0.9 - 3.3 10e3/uL   MONO# 0.7  0.1 - 0.9 10e3/uL   Eosinophils Absolute 0.1  0.0 - 0.5 10e3/uL   Basophils Absolute 0.1  0.0 - 0.1 10e3/uL   NEUT% 46.3  38.4 - 76.8 %   LYMPH% 37.2  14.0 - 49.7 %   MONO% 13.1  0.0 - 14.0 %   EOS% 2.1  0.0 - 7.0 %   BASO% 1.3  0.0 - 2.0 %    Lab Results  Component Value Date   WBC 5.0 01/30/2014   HGB 13.4 01/30/2014   HCT 42.2 01/30/2014   MCV 85.7 01/30/2014   PLT 238 01/30/2014   ASSESSMENT & PLAN:  Chronic neutropenia Repeat  bone marrow biopsy and T-cell rearrangement study excluded LGL. The patient is not symptomatic. I discussed with her neutropenic precaution. I will review her bone marrow biopsy next week at the hematology tumor board. I will see the patient twice a year with blood work, history and physical examination. The patient is educated to watch out for signs and symptoms of disease. She is recommended to receive yearly influenza vaccination and to start pneumococcal vaccination. I gave her a prescription of fluconazole and levofloxacin to hang onto due to risk of recurrent yeast infection and bacteria infection. The patient is recommended to call me first if possible for evaluation prior to starting dose antimicrobial therapies.   History of ovarian cancer Clinically, she has no evidence of disease.   She responded well to G-CSF injection. I plan to reduce injection to only one day next time to reduce the risk of bone pain. All questions were answered. The patient knows to call the clinic with any problems, questions or concerns. No barriers to learning was detected.  I spent 25 minutes counseling the patient face to face. The total time spent in the appointment was 30 minutes and more than 50% was on counseling.     [facility], [place], MD 01/30/2014 2:55 PM

## 2014-01-31 ENCOUNTER — Other Ambulatory Visit: Payer: BC Managed Care – PPO

## 2014-01-31 ENCOUNTER — Ambulatory Visit: Payer: BC Managed Care – PPO | Admitting: Hematology and Oncology

## 2014-02-12 LAB — CHROMOSOME ANALYSIS, BONE MARROW

## 2014-02-13 ENCOUNTER — Encounter: Payer: Self-pay | Admitting: Hematology and Oncology

## 2014-02-13 NOTE — Progress Notes (Signed)
[name] called regarding the denial for her neupogen.  I told her Dr. Alvy Bimler had a copy and would dictate a letter and I could send her a copy. Cell # [id number].

## 2014-02-17 ENCOUNTER — Encounter: Payer: Self-pay | Admitting: Hematology and Oncology

## 2014-02-18 ENCOUNTER — Encounter: Payer: Self-pay | Admitting: Hematology and Oncology

## 2014-06-12 ENCOUNTER — Other Ambulatory Visit: Payer: Self-pay | Admitting: Hematology and Oncology

## 2014-08-21 ENCOUNTER — Other Ambulatory Visit: Payer: Self-pay | Admitting: Dermatology

## 2014-08-25 ENCOUNTER — Ambulatory Visit (INDEPENDENT_AMBULATORY_CARE_PROVIDER_SITE_OTHER): Payer: BLUE CROSS/BLUE SHIELD | Admitting: Family Medicine

## 2014-08-25 ENCOUNTER — Ambulatory Visit (INDEPENDENT_AMBULATORY_CARE_PROVIDER_SITE_OTHER): Payer: BLUE CROSS/BLUE SHIELD

## 2014-08-25 VITALS — BP 118/66 | HR 91 | Temp 97.4°F | Resp 16 | Ht 63.0 in | Wt 255.2 lb

## 2014-08-25 DIAGNOSIS — E118 Type 2 diabetes mellitus with unspecified complications: Secondary | ICD-10-CM

## 2014-08-25 DIAGNOSIS — J988 Other specified respiratory disorders: Secondary | ICD-10-CM

## 2014-08-25 DIAGNOSIS — Z8543 Personal history of malignant neoplasm of ovary: Secondary | ICD-10-CM

## 2014-08-25 DIAGNOSIS — R05 Cough: Secondary | ICD-10-CM

## 2014-08-25 DIAGNOSIS — R509 Fever, unspecified: Secondary | ICD-10-CM

## 2014-08-25 DIAGNOSIS — Z862 Personal history of diseases of the blood and blood-forming organs and certain disorders involving the immune mechanism: Secondary | ICD-10-CM

## 2014-08-25 DIAGNOSIS — R059 Cough, unspecified: Secondary | ICD-10-CM

## 2014-08-25 DIAGNOSIS — D709 Neutropenia, unspecified: Secondary | ICD-10-CM

## 2014-08-25 DIAGNOSIS — J22 Unspecified acute lower respiratory infection: Secondary | ICD-10-CM

## 2014-08-25 LAB — POCT CBC
Granulocyte percent: 35.8 % — AB (ref 37–80)
HCT, POC: 41.3 % (ref 37.7–47.9)
Hemoglobin: 13 g/dL (ref 12.2–16.2)
Lymph, poc: 1.2 (ref 0.6–3.4)
MCH, POC: 27.5 pg (ref 27–31.2)
MCHC: 31.6 g/dL — AB (ref 31.8–35.4)
MCV: 86.8 fL (ref 80–97)
MID (cbc): 0.3 (ref 0–0.9)
MPV: 7.2 fL (ref 0–99.8)
POC Granulocyte: 0.9 — AB (ref 2–6.9)
POC LYMPH PERCENT: 51 %L — AB (ref 10–50)
POC MID %: 13.2 % — AB (ref 0–12)
Platelet Count, POC: 288 10*3/uL (ref 142–424)
RBC: 4.75 M/uL (ref 4.04–5.48)
RDW, POC: 17 %
WBC: 2.4 10*3/uL — AB (ref 4.6–10.2)

## 2014-08-25 LAB — GLUCOSE, POCT (MANUAL RESULT ENTRY): POC Glucose: 116 mg/dl — AB (ref 70–99)

## 2014-08-25 MED ORDER — LEVOFLOXACIN 500 MG PO TABS: 500.0000 mg | ORAL_TABLET | Freq: Every day | ORAL | Status: DC

## 2014-08-25 MED ORDER — FLUCONAZOLE 100 MG PO TABS: 100.0000 mg | ORAL_TABLET | Freq: Once | ORAL | Status: DC

## 2014-08-25 MED ORDER — IPRATROPIUM BROMIDE 0.02 % IN SOLN
0.5000 mg | Freq: Once | RESPIRATORY_TRACT | Status: AC
  Administered 2014-08-25: 0.5 mg via RESPIRATORY_TRACT

## 2014-08-25 MED ORDER — BENZONATATE 100 MG PO CAPS: 200.0000 mg | ORAL_CAPSULE | Freq: Two times a day (BID) | ORAL | Status: DC | PRN

## 2014-08-25 MED ORDER — HYDROCOD POLST-CHLORPHEN POLST 10-8 MG/5ML PO LQCR: 5.0000 mL | Freq: Two times a day (BID) | ORAL | Status: DC | PRN

## 2014-08-25 MED ORDER — ALBUTEROL SULFATE (2.5 MG/3ML) 0.083% IN NEBU
2.5000 mg | INHALATION_SOLUTION | Freq: Once | RESPIRATORY_TRACT | Status: AC
  Administered 2014-08-25: 2.5 mg via RESPIRATORY_TRACT

## 2014-08-25 NOTE — Patient Instructions (Signed)

## 2014-08-25 NOTE — Progress Notes (Signed)
`     Chief Complaint:  Chief Complaint  Patient presents with  . chest tightness    started on weds.  . Wheezing  . Cough    productive     HPI: [name] is a 59 y.o. female who is here for URI sxs, she has autoimmune neutropenia, has a history of  Ovarian cancer,  She has a 10 day supply of emergency abx, LEvaquin, that her hematologist/oncologist  has given her, she has not taken it.  She has had wet and dry cough, she states her oncologist wants to take her blood cx before she gets blood cultures.  Her tmax was 99.---She normally runs low with her temp Her husband had pneumonia so she is worried she ahs it as well She has had chillls and subjective fevers, she has had this since 4 days ago, but she had not been feeling 100% since Novemebr  She has not felt like she has gotten over the hump, she would feel better and then feel worse. Again this has been ongoing since Nov 2015.    PCP: Dr Inda Merlin Gyn/oncologist: [facility] Endocrinologist: Dr Atlheimer Hematologist:  Dr Alvy Bimler   Last Hba1c was 6.2 TSH was normal   No results found for: HGBA1C    SpO2 Readings from Last 3 Encounters:  08/25/14 94%  08/21/13 99%     Past Medical History  Diagnosis Date  . Hypothyroidism 12/14/2011  . DM type 2 (diabetes mellitus, type 2) 12/14/2011  . Benign essential HTN 12/14/2011  . Cancer     '06-Ovarian/ Uterine Cancer-chemotherapy  . Transfusion history     '06-s/p hysterectomy and staging  . Anemia     Planned Neupogen injections for chronic neutropenia  . Neutropenia    Past Surgical History  Procedure Laterality Date  . Appendectomy    . Abdominal hysterectomy      staging for Ovarian cancer  . Tonsillectomy    . Cholecystectomy      laparoscopic  . Eye surgery      pytosis of eyelids-child  . Colonoscopy with propofol N/A 08/21/2013    Procedure: COLONOSCOPY WITH PROPOFOL;  Surgeon: Cleotis Nipper, MD;  Location: [facility] ENDOSCOPY;  Service: Endoscopy;   Laterality: N/A;   History   Social History  . Marital Status: Married    Spouse Name: N/A    Number of Children: N/A  . Years of Education: N/A   Social History Main Topics  . Smoking status: Never Smoker   . Smokeless tobacco: Never Used  . Alcohol Use: Yes     Comment: rare social  . Drug Use: No  . Sexual Activity: Not Currently   Other Topics Concern  . None   Social History Narrative   Family History  Problem Relation Age of Onset  . Cancer Mother     stomach ca  . Cancer Father     prostate ca   Allergies  Allergen Reactions  . Penicillins Swelling    Facial swelling  . Adhesive [Tape]     blisters  . Cefaclor Rash    Ceclor  . Erythromycin     gastritis  . Trimethoprim Rash  . Sulfa Antibiotics Rash   Prior to Admission medications   Medication Sig Start Date End Date Taking? Authorizing Provider  aspirin EC 81 MG tablet Take 81 mg by mouth daily.   Yes Historical Provider, MD  Calcium Carbonate-Vitamin D (CALCIUM + D PO) Take 2 tablets by mouth daily.  Yes Historical Provider, MD  Canagliflozin (INVOKANA) 300 MG TABS Take 1 tablet by mouth every morning.    Yes Historical Provider, MD  Cholecalciferol (VITAMIN D3) 10000 UNITS capsule Take 10,000 Units by mouth once a week. Sundays   Yes Historical Provider, MD  fluconazole (DIFLUCAN) 100 MG tablet Take 1 tablet (100 mg total) by mouth daily. 01/30/14  Yes Heath Lark, MD  hydrochlorothiazide (HYDRODIURIL) 12.5 MG tablet Take 12.5 mg by mouth every morning.    Yes Historical Provider, MD  levothyroxine (SYNTHROID) 175 MCG tablet Take 175 mcg by mouth daily before breakfast.   Yes Historical Provider, MD  metFORMIN (GLUCOPHAGE) 1000 MG tablet Take 1,000 mg by mouth 2 (two) times daily with a meal.    Yes Historical Provider, MD  Multiple Vitamin (MULTIVITAMIN WITH MINERALS) TABS tablet Take 1 tablet by mouth daily.   Yes Historical Provider, MD  naproxen sodium (ALEVE) 220 MG tablet Take 220 mg by mouth at  bedtime as needed (Pain).   Yes Historical Provider, MD  omega-3 acid ethyl esters (LOVAZA) 1 G capsule Take 1 g by mouth 2 (two) times daily.   Yes Historical Provider, MD  omeprazole (PRILOSEC) 20 MG capsule Take 20 mg by mouth daily.   Yes Historical Provider, MD  Polyethyl Glycol-Propyl Glycol (SYSTANE OP) Apply 1 drop to eye at bedtime.   Yes Historical Provider, MD  rosuvastatin (CRESTOR) 10 MG tablet Take 10 mg by mouth every evening.    Yes Historical Provider, MD  sitaGLIPtin (JANUVIA) 100 MG tablet Take 100 mg by mouth daily.   Yes Historical Provider, MD  valsartan (DIOVAN) 320 MG tablet Take 320 mg by mouth every morning.   Yes Historical Provider, MD  levofloxacin (LEVAQUIN) 500 MG tablet Take 1 tablet (500 mg total) by mouth daily. Patient not taking: Reported on 08/25/2014 01/30/14   Heath Lark, MD     ROS: The patient denies night sweats, unintentional weight loss, chest pain, palpitations, wheezing, dyspnea on exertion, nausea, vomiting, abdominal pain, dysuria, hematuria, melena, numbness,  or tingling.   All other systems have been reviewed and were otherwise negative with the exception of those mentioned in the HPI and as above.    PHYSICAL EXAM: Filed Vitals:   08/25/14 1452  BP: 118/66  Pulse: 91  Temp: 97.4 F (36.3 C)  Resp: 16   Filed Vitals:   08/25/14 1452  Height: 5\' 3"  (1.6 m)  Weight: 255 lb 3.2 oz (115.758 kg)   Body mass index is 45.22 kg/(m^2).  General: Alert, no acute distress, tired appearing female HEENT:  Normocephalic, atraumatic, oropharynx patent. EOMI, PERRLA. Erythematous throat, no exudates, TM normal, +/- sinus tenderness, + erythematous/boggy nasal mucosa Cardiovascular:  Regular rate and rhythm, no rubs murmurs or gallops.  No Carotid bruits, radial pulse intact. No pedal edema.  Respiratory: Clear to auscultation bilaterally.  No wheezes, rales, or rhonchi.  No cyanosis, no use of accessory musculature GI: No organomegaly, abdomen is  soft and non-tender, positive bowel sounds.  No masses. Skin: No rashes. Neurologic: Facial musculature symmetric. Psychiatric: Patient is appropriate throughout our interaction. Lymphatic: No cervical lymphadenopathy Musculoskeletal: Gait intact.   LABS: Results for orders placed or performed in visit on 08/25/14  POCT CBC  Result Value Ref Range   WBC 2.4 (A) 4.6 - 10.2 K/uL   Lymph, poc 1.2 0.6 - 3.4   POC LYMPH PERCENT 51.0 (A) 10 - 50 %L   MID (cbc) 0.3 0 - 0.9   POC MID %  13.2 (A) 0 - 12 %M   POC Granulocyte 0.9 (A) 2 - 6.9   Granulocyte percent 35.8 (A) 37 - 80 %G   RBC 4.75 4.04 - 5.48 M/uL   Hemoglobin 13.0 12.2 - 16.2 g/dL   HCT, POC 41.3 37.7 - 47.9 %   MCV 86.8 80 - 97 fL   MCH, POC 27.5 27 - 31.2 pg   MCHC 31.6 (A) 31.8 - 35.4 g/dL   RDW, POC 17.0 %   Platelet Count, POC 288 142 - 424 K/uL   MPV 7.2 0 - 99.8 fL  POCT glucose (manual entry)  Result Value Ref Range   POC Glucose 116 (A) 70 - 99 mg/dl     EKG/XRAY:   Primary read interpreted by Dr. Marin Comment at [facility] No acute cardiopulm process Increase vascular markings.    ASSESSMENT/PLAN: Encounter Diagnoses  Name Primary?  . Cough Yes  . History of ovarian cancer   . History of neutropenia   . Fever, unspecified fever cause   . Type 2 diabetes mellitus with complication   . Lower respiratory infection (e.g., bronchitis, pneumonia, pneumonitis, pulmonitis)    59 y/o female with URI sxs that have progressed to acute bronchitis RX Levaquin since able to tolerate x 10 days, she does not want to use her emergency pack if she does not need to.  Rx Tussionex, diflucan prn for yeast infection Advise to push fluids at home After neb treatment she had 98% o2, +/- felt better Otc mucinex prn Refer to Dr Everitt Amber for history of ovarian cancer so she has someone to moniotr her.    Gross sideeffects, risk and benefits, and alternatives of medications d/w patient. Patient is aware that all medications have  potential sideeffects and we are unable to predict every sideeffect or drug-drug interaction that may occur.  Clemmie Marxen, [place], DO 08/25/2014 4:35 PM

## 2014-08-26 LAB — COMPLETE METABOLIC PANEL WITHOUT GFR
AST: 19 U/L (ref 0–37)
CO2: 27 meq/L (ref 19–32)
GFR, Est Non African American: 76 mL/min
Glucose, Bld: 108 mg/dL — ABNORMAL HIGH (ref 70–99)
Total Bilirubin: 0.5 mg/dL (ref 0.2–1.2)
Total Protein: 6.9 g/dL (ref 6.0–8.3)

## 2014-08-26 LAB — COMPLETE METABOLIC PANEL WITH GFR
ALT: 28 U/L (ref 0–35)
Albumin: 3.9 g/dL (ref 3.5–5.2)
Alkaline Phosphatase: 60 U/L (ref 39–117)
BUN: 22 mg/dL (ref 6–23)
Calcium: 9.8 mg/dL (ref 8.4–10.5)
Chloride: 95 mEq/L — ABNORMAL LOW (ref 96–112)
Creat: 0.84 mg/dL (ref 0.50–1.10)
GFR, Est African American: 87 mL/min
Potassium: 3.6 mEq/L (ref 3.5–5.3)
Sodium: 136 mEq/L (ref 135–145)

## 2014-08-31 LAB — CULTURE, BLOOD, SINGLE SET ONLY: Organism ID, Bacteria: NO GROWTH

## 2014-09-01 LAB — CULTURE, BLOOD (SINGLE): Organism ID, Bacteria: NO GROWTH

## 2014-09-02 ENCOUNTER — Ambulatory Visit: Payer: Self-pay | Admitting: Internal Medicine

## 2014-09-05 ENCOUNTER — Other Ambulatory Visit (HOSPITAL_BASED_OUTPATIENT_CLINIC_OR_DEPARTMENT_OTHER): Payer: BLUE CROSS/BLUE SHIELD

## 2014-09-05 ENCOUNTER — Ambulatory Visit (HOSPITAL_BASED_OUTPATIENT_CLINIC_OR_DEPARTMENT_OTHER): Payer: BLUE CROSS/BLUE SHIELD | Admitting: Hematology and Oncology

## 2014-09-05 ENCOUNTER — Ambulatory Visit (HOSPITAL_BASED_OUTPATIENT_CLINIC_OR_DEPARTMENT_OTHER): Payer: BLUE CROSS/BLUE SHIELD

## 2014-09-05 ENCOUNTER — Encounter: Payer: Self-pay | Admitting: Hematology and Oncology

## 2014-09-05 ENCOUNTER — Telehealth: Payer: Self-pay | Admitting: Hematology and Oncology

## 2014-09-05 VITALS — BP 140/64 | HR 82 | Temp 97.4°F | Resp 20 | Ht 63.0 in | Wt 253.7 lb

## 2014-09-05 DIAGNOSIS — R140 Abdominal distension (gaseous): Secondary | ICD-10-CM

## 2014-09-05 DIAGNOSIS — Z8543 Personal history of malignant neoplasm of ovary: Secondary | ICD-10-CM

## 2014-09-05 DIAGNOSIS — N950 Postmenopausal bleeding: Secondary | ICD-10-CM | POA: Insufficient documentation

## 2014-09-05 DIAGNOSIS — D709 Neutropenia, unspecified: Secondary | ICD-10-CM

## 2014-09-05 LAB — CBC WITH DIFFERENTIAL/PLATELET
BASO%: 2.3 % — ABNORMAL HIGH (ref 0.0–2.0)
Basophils Absolute: 0.1 10*3/uL (ref 0.0–0.1)
EOS%: 3.4 % (ref 0.0–7.0)
Eosinophils Absolute: 0.1 10*3/uL (ref 0.0–0.5)
HCT: 42.5 % (ref 34.8–46.6)
HEMOGLOBIN: 13.4 g/dL (ref 11.6–15.9)
LYMPH#: 1.6 10*3/uL (ref 0.9–3.3)
LYMPH%: 53.1 % — AB (ref 14.0–49.7)
MCH: 27.1 pg (ref 25.1–34.0)
MCHC: 31.6 g/dL (ref 31.5–36.0)
MCV: 85.9 fL (ref 79.5–101.0)
MONO#: 0.7 10*3/uL (ref 0.1–0.9)
MONO%: 23 % — AB (ref 0.0–14.0)
NEUT%: 18.2 % — ABNORMAL LOW (ref 38.4–76.8)
NEUTROS ABS: 0.6 10*3/uL — AB (ref 1.5–6.5)
Platelets: 361 10*3/uL (ref 145–400)
RBC: 4.95 10*6/uL (ref 3.70–5.45)
RDW: 16.4 % — ABNORMAL HIGH (ref 11.2–14.5)
WBC: 3.1 10*3/uL — ABNORMAL LOW (ref 3.9–10.3)

## 2014-09-05 MED ORDER — TBO-FILGRASTIM 480 MCG/0.8ML ~~LOC~~ SOSY
480.0000 ug | PREFILLED_SYRINGE | Freq: Every day | SUBCUTANEOUS | Status: DC
  Administered 2014-09-05: 480 ug via SUBCUTANEOUS
  Filled 2014-09-05: qty 0.8

## 2014-09-05 NOTE — Assessment & Plan Note (Signed)
The patient have symptoms of lower abdominal bloating and pelvic discomfort. She has intermittent vaginal spotting. Her appointment to see gynecologist is not until next month. I am concerned about possible disease recurrence. The patient is morbidly obese and it is impossible for me to examine her in the office today. I recommend drawing tumor marker CA 125 next week and CT scan of the abdomen and pelvis to exclude cancer recurrence

## 2014-09-05 NOTE — Assessment & Plan Note (Signed)
With her history of ovarian cancer, I am concerned about disease recurrence. I will proceed to order tumor marker and CT scan as above.

## 2014-09-05 NOTE — Telephone Encounter (Signed)
Pt confirmed labs/ov/inj per 02/04 POF, gave pt AVS..... KJ, sent msg to add IVIG

## 2014-09-05 NOTE — Assessment & Plan Note (Signed)
I am concerned about her unresolving symptoms of bronchitis. The patient is neutropenic and is not improving despite 10 days course of antibiotic therapy. I recommend G-CSF injection today and tomorrow to boost her white count. We will recheck it next week. I will continue G-CSF injection whenever ANC less than 1500. If she does not improve by next week, I recommend IVIG infusion. I will see her back in 2 weeks for further assessment.

## 2014-09-05 NOTE — Progress Notes (Signed)
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 Going to the patient, initial diagnosis with ovarian cancer was when she presented with abdomen and no swelling and pelvic pain leading to her urgent care visit. She states that the tumor had caused torsion of the ovary. She cannot remember the stage of her disease. She began to develop leukopenia in approximately July of 2010. She underwent an initial bone marrow biopsy at [facility] in [place] on 03/17/2009. but it was nondiagnostic. No gross dysplastic changes, no excess blasts, normal cytogenetics.  Her counts have been stable over observation through this office since December 2010 with some minor fluctuations. Total white counts run as low as 1800 and as high as 2900. Percent neutrophils fluctuates very widely from as low as 4% recorded on a CBC done 02/03/2010 to as high as 31% with average being 20%. She was hospitalized in the ICU at that time  Both hemoglobin and platelets have remained stable over time.  Despite leukopenia, she has not had any recurrent or severe infections. She stated strong family history of lupus in a cousin. She complained of occasional skin rash and joint stiffness. 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 She received G-CSF in January of 2015 to stimulate white blood cell production around a colonoscopy procedure a few years ago. She had a limited response. On 01/23/2014,  repeat bone marrow biopsy excluded lymphoma or leukemia.  INTERVAL HISTORY: Please see below for problem oriented charting. Since the last time she was seen here, she had recurrent upper respiratory tract infection with bronchitis. She has completed a course of levofloxacin recently and still have symptoms of cough, congestion and shortness of breath with minimal activity. In addition, she had some change in bowel habits with difficulties with defecation and pelvic pain. She had bladder and pelvic discomfort with intermittent vaginal spotting for several months. She complained of abdominal bloating.  REVIEW OF SYSTEMS:   Constitutional: Denies fevers, chills or abnormal weight loss Eyes: Denies blurriness of vision Cardiovascular: Denies palpitation, chest discomfort or lower extremity swelling Skin: Denies abnormal skin rashes Lymphatics: Denies new lymphadenopathy or easy bruising Neurological:Denies numbness, tingling or new weaknesses Behavioral/Psych: Mood is stable, no new changes  All other systems were reviewed with the patient and are negative.  I have reviewed the past medical history, past surgical history, social history and family history with the patient and they are unchanged from previous note.  ALLERGIES:  is allergic to penicillins; adhesive; cefaclor; erythromycin; trimethoprim; and sulfa antibiotics.  MEDICATIONS:  Current Outpatient Prescriptions  Medication Sig Dispense Refill  . aspirin EC 81 MG tablet Take 81 mg by mouth daily.    . Calcium Carbonate-Vitamin D (CALCIUM + D PO) Take 2 tablets by mouth daily.    . Canagliflozin (INVOKANA) 300 MG TABS Take 1 tablet by mouth every morning.     . Cholecalciferol (VITAMIN D3) 10000 UNITS capsule Take 10,000 Units by mouth once a week. Sundays    . fluconazole (DIFLUCAN) 100 MG tablet Take 1 tablet (100 mg total) by mouth  once. May repeat prn 2 tablet 0  . hydrochlorothiazide (HYDRODIURIL) 12.5 MG tablet Take 12.5 mg by  mouth every morning.     [name] Kitchen levothyroxine (SYNTHROID) 175 MCG tablet Take 175 mcg by mouth daily before breakfast.    . metFORMIN (GLUCOPHAGE) 1000 MG tablet Take 1,000 mg by mouth 2 (two) times daily with a meal.     . Multiple Vitamin (MULTIVITAMIN WITH MINERALS) TABS tablet Take 1 tablet by mouth daily.    . naproxen sodium (ALEVE) 220 MG tablet Take 220 mg by mouth at bedtime as needed (Pain).    [name] Kitchen omega-3 acid ethyl esters (LOVAZA) 1 G capsule Take 1 g by mouth 2 (two) times daily.    [name] Kitchen omeprazole (PRILOSEC) 20 MG capsule Take 20 mg by mouth daily.    Vladimir Faster Glycol-Propyl Glycol (SYSTANE OP) Apply 1 drop to eye at bedtime.    . rosuvastatin (CRESTOR) 10 MG tablet Take 10 mg by mouth every evening.     . sitaGLIPtin (JANUVIA) 100 MG tablet Take 100 mg by mouth daily.    . valsartan (DIOVAN) 320 MG tablet Take 320 mg by mouth every morning.     No current facility-administered medications for this visit.   Facility-Administered Medications Ordered in Other Visits  Medication Dose Route Frequency Provider Last Rate Last Dose  . Tbo-Filgrastim (GRANIX) injection 480 mcg  480 mcg Subcutaneous Daily Heath Lark, MD        PHYSICAL EXAMINATION: ECOG PERFORMANCE STATUS: 2 - Symptomatic, <50% confined to bed  Filed Vitals:   09/05/14 1432  BP: 140/64  Pulse: 82  Temp: 97.4 F (36.3 C)  Resp: 20   Filed Weights   09/05/14 1432  Weight: 253 lb 11.2 oz (115.078 kg)    GENERAL:alert, no distress and comfortable. She is morbidly obese SKIN: skin color, texture, turgor are normal, no rashes or significant lesions EYES: normal, Conjunctiva are pink and non-injected, sclera clear OROPHARYNX:no exudate, no erythema and lips, buccal mucosa, and tongue normal  NECK: supple, thyroid normal size, non-tender, without nodularity LYMPH:  no palpable lymphadenopathy in the cervical, axillary or inguinal LUNGS: clear to auscultation and percussion with normal breathing effort HEART: regular  rate & rhythm and no murmurs and no lower extremity edema ABDOMEN:abdomen soft, non-tender and normal bowel sounds Musculoskeletal:no cyanosis of digits and no clubbing  NEURO: alert & oriented x 3 with fluent speech, no focal motor/sensory deficits  LABORATORY DATA:  I have reviewed the data as listed    Component Value Date/Time   NA 136 08/25/2014 1538   NA 141 01/14/2014 1151   K 3.6 08/25/2014 1538   K 3.9 01/14/2014 1151   CL 95* 08/25/2014 1538   CO2 27 08/25/2014 1538   CO2 29 01/14/2014 1151   GLUCOSE 108* 08/25/2014 1538   GLUCOSE 166* 01/14/2014 1151   BUN 22 08/25/2014 1538   BUN 16.7 01/14/2014 1151   CREATININE 0.84 08/25/2014 1538   CREATININE 0.8 01/14/2014 1151   CREATININE 0.87 06/29/2010 1532   CALCIUM 9.8 08/25/2014 1538   CALCIUM 9.7 01/14/2014 1151   PROT 6.9 08/25/2014 1538   PROT 7.3 01/14/2014 1151   ALBUMIN 3.9 08/25/2014 1538   ALBUMIN 3.4* 01/14/2014 1151   AST 19 08/25/2014 1538   AST 17 01/14/2014 1151   ALT 28 08/25/2014 1538   ALT 27 01/14/2014 1151   ALKPHOS 60 08/25/2014 1538   ALKPHOS 68 01/14/2014 1151   BILITOT 0.5 08/25/2014 1538   BILITOT 0.38 01/14/2014  [place] 08/25/2014 1538   GFRNONAA >60 04/19/2010 2037   GFRAA 87 08/25/2014 1538   GFRAA  04/19/2010 2037    >60        The eGFR has been calculated using the MDRD equation. This calculation has not been validated in all clinical situations. eGFR's persistently <60 mL/min signify possible Chronic Kidney Disease.    No results found for: SPEP, UPEP  Lab Results  Component Value Date   WBC 3.1* 09/05/2014   NEUTROABS 0.6* 09/05/2014   HGB 13.4 09/05/2014   HCT 42.5 09/05/2014   MCV 85.9 09/05/2014   PLT 361 09/05/2014      Chemistry      Component Value Date/Time   NA 136 08/25/2014 1538   NA 141 01/14/2014 1151   K 3.6 08/25/2014 1538   K 3.9 01/14/2014 1151   CL 95* 08/25/2014 1538   CO2 27 08/25/2014 1538   CO2 29 01/14/2014 1151   BUN 22  08/25/2014 1538   BUN 16.7 01/14/2014 1151   CREATININE 0.84 08/25/2014 1538   CREATININE 0.8 01/14/2014 1151   CREATININE 0.87 06/29/2010 1532      Component Value Date/Time   CALCIUM 9.8 08/25/2014 1538   CALCIUM 9.7 01/14/2014 1151   ALKPHOS 60 08/25/2014 1538   ALKPHOS 68 01/14/2014 1151   AST 19 08/25/2014 1538   AST 17 01/14/2014 1151   ALT 28 08/25/2014 1538   ALT 27 01/14/2014 1151   BILITOT 0.5 08/25/2014 1538   BILITOT 0.38 01/14/2014 1151       ASSESSMENT & PLAN:  Chronic neutropenia I am concerned about her unresolving symptoms of bronchitis. The patient is neutropenic and is not improving despite 10 days course of antibiotic therapy. I recommend G-CSF injection today and tomorrow to boost her white count. We will recheck it next week. I will continue G-CSF injection whenever ANC less than 1500. If she does not improve by next week, I recommend IVIG infusion. I will see her back in 2 weeks for further assessment.   History of ovarian cancer The patient have symptoms of lower abdominal bloating and pelvic discomfort. She has intermittent vaginal spotting. Her appointment to see gynecologist is not until next month. I am concerned about possible disease recurrence. The patient is morbidly obese and it is impossible for me to examine her in the office today. I recommend drawing tumor marker CA 125 next week and CT scan of the abdomen and pelvis to exclude cancer recurrence   Postmenopausal bleeding With her history of ovarian cancer, I am concerned about disease recurrence. I will proceed to order tumor marker and CT scan as above.    Orders Placed This Encounter  Procedures  . CT Abdomen Pelvis W Contrast    Standing Status: Future     Number of Occurrences:      Standing Expiration Date: 12/06/2015    Order Specific Question:  Reason for Exam (SYMPTOM  OR DIAGNOSIS REQUIRED)    Answer:  ovarian cancer, post-menopausal bleeding, exclude recurrence    Order  Specific Question:  Is the patient pregnant?    Answer:  No    Order Specific Question:  Preferred imaging location?    Answer:  [facility]    Standing Status: Future     Number of Occurrences:      Standing Expiration Date: 10/10/2015  . Comprehensive metabolic panel    Standing Status: Future     Number  of Occurrences:      Standing Expiration Date: 10/10/2015   All questions were answered. The patient knows to call the clinic with any problems, questions or concerns. No barriers to learning was detected. I spent 30 minutes counseling the patient face to face. The total time spent in the appointment was 40 minutes and more than 50% was on counseling and review of test results     [facility], [place], MD 09/05/2014 3:45 PM

## 2014-09-06 ENCOUNTER — Telehealth: Payer: Self-pay | Admitting: *Deleted

## 2014-09-06 ENCOUNTER — Ambulatory Visit (HOSPITAL_BASED_OUTPATIENT_CLINIC_OR_DEPARTMENT_OTHER): Payer: BLUE CROSS/BLUE SHIELD

## 2014-09-06 DIAGNOSIS — D709 Neutropenia, unspecified: Secondary | ICD-10-CM

## 2014-09-06 MED ORDER — TBO-FILGRASTIM 480 MCG/0.8ML ~~LOC~~ SOSY
480.0000 ug | PREFILLED_SYRINGE | Freq: Every day | SUBCUTANEOUS | Status: DC
  Administered 2014-09-06: 480 ug via SUBCUTANEOUS
  Filled 2014-09-06: qty 0.8

## 2014-09-06 NOTE — Telephone Encounter (Signed)
Per staff message and POF I have scheduled appts. Advised scheduler of appts. JMW  

## 2014-09-09 ENCOUNTER — Ambulatory Visit (INDEPENDENT_AMBULATORY_CARE_PROVIDER_SITE_OTHER): Payer: BLUE CROSS/BLUE SHIELD | Admitting: Internal Medicine

## 2014-09-09 ENCOUNTER — Encounter: Payer: Self-pay | Admitting: Internal Medicine

## 2014-09-09 VITALS — BP 139/65 | HR 81 | Temp 97.9°F | Resp 16 | Ht 63.0 in | Wt 253.0 lb

## 2014-09-09 DIAGNOSIS — E785 Hyperlipidemia, unspecified: Secondary | ICD-10-CM

## 2014-09-09 DIAGNOSIS — E669 Obesity, unspecified: Secondary | ICD-10-CM

## 2014-09-09 DIAGNOSIS — E042 Nontoxic multinodular goiter: Secondary | ICD-10-CM

## 2014-09-09 DIAGNOSIS — I10 Essential (primary) hypertension: Secondary | ICD-10-CM

## 2014-09-09 DIAGNOSIS — R0989 Other specified symptoms and signs involving the circulatory and respiratory systems: Secondary | ICD-10-CM

## 2014-09-09 DIAGNOSIS — E1169 Type 2 diabetes mellitus with other specified complication: Secondary | ICD-10-CM

## 2014-09-09 DIAGNOSIS — R109 Unspecified abdominal pain: Secondary | ICD-10-CM | POA: Insufficient documentation

## 2014-09-09 DIAGNOSIS — J989 Respiratory disorder, unspecified: Secondary | ICD-10-CM

## 2014-09-09 DIAGNOSIS — E119 Type 2 diabetes mellitus without complications: Secondary | ICD-10-CM

## 2014-09-09 MED ORDER — ALBUTEROL SULFATE HFA 108 (90 BASE) MCG/ACT IN AERS: 2.0000 | INHALATION_SPRAY | Freq: Four times a day (QID) | RESPIRATORY_TRACT | Status: DC | PRN

## 2014-09-09 NOTE — Progress Notes (Signed)
Patient ID: [name], female   DOB: 12/13/1955, 59 y.o.   MRN: [id number]   [name], is a 59 y.o. female  [id number]  [id number]  DOB - 1956-03-01  CC:  Chief Complaint  Patient presents with  . Establish Care       HPI: [name] is a 59 y.o. female here today to establish medical care. Pt is currently under the care of Dr. Mertha Finders and is considering changing her care. She is very verbose and has multiple chronic conditions which are all currently managed by specialists except for HTN. There only acute concern that she expresses today is that of vaginal spotting and pelvic pain in the setting of a previous history of ovarian and Uterine Cancer. She was recently seen by her Hematologist/Oncologist who has ordered a CT of abdomen and pelvis and she has an appointment scheduled with Dr. Denman [name] (Gyn-onc) on 10/04/2014.    She was also recently treated for an acute bronchitis and was treated with Levaquin and nebulized Albuterol and givne a prescription for Levaquin. She states that she still has cough which has improved and occasional wheezing. She reports that her WBC was very low secondary to her chronic neutropenia and she was started on Neupogen by Dr. Pollyann Savoy. She reports that she is feeling somewhat better since starting the Neupogen.  Patient has No headache, No chest pain, No abdominal pain - No Nausea, No new weakness tingling or numbness, No Cough - SOB.  Allergies  Allergen Reactions  . Penicillins Swelling    Facial swelling  . Adhesive [Tape]     blisters  . Cefaclor Rash    Ceclor  . Erythromycin     gastritis  . Trimethoprim Rash  . Sulfa Antibiotics Rash   Past Medical History  Diagnosis Date  . Hypothyroidism 12/14/2011  . DM type 2 (diabetes mellitus, type 2) 12/14/2011  . Benign essential HTN 12/14/2011  . Neutropenia   . Postmenopausal bleeding 09/05/2014  . Cancer     '06-Ovarian/ Uterine Cancer-chemotherapy  . Anemia     Planned Neupogen  injections for chronic neutropenia  . Transfusion history     '06-s/p hysterectomy and staging  . Multiple thyroid nodules y-8    Managed by Dr. Harlow Asa  . GERD (gastroesophageal reflux disease)   . Hiatal hernia y-12  . Polyp of duodenum y-2   Current Outpatient Prescriptions on File Prior to Visit  Medication Sig Dispense Refill  . aspirin EC 81 MG tablet Take 81 mg by mouth daily.    . Calcium Carbonate-Vitamin D (CALCIUM + D PO) Take 2 tablets by mouth daily.    . Canagliflozin (INVOKANA) 300 MG TABS Take 1 tablet by mouth every morning.     . Cholecalciferol (VITAMIN D3) 10000 UNITS capsule Take 10,000 Units by mouth once a week. Sundays    . fluconazole (DIFLUCAN) 100 MG tablet Take 1 tablet (100 mg total) by mouth once. May repeat prn 2 tablet 0  . hydrochlorothiazide (HYDRODIURIL) 12.5 MG tablet Take 12.5 mg by mouth every morning.     [name] Kitchen levothyroxine (SYNTHROID) 175 MCG tablet Take 175 mcg by mouth daily before breakfast.    . metFORMIN (GLUCOPHAGE) 1000 MG tablet Take 1,000 mg by mouth 2 (two) times daily with a meal.     . Multiple Vitamin (MULTIVITAMIN WITH MINERALS) TABS tablet Take 1 tablet by mouth daily.    . naproxen sodium (ALEVE) 220 MG tablet Take 220 mg by mouth at bedtime as needed (  Pain).    . omega-3 acid ethyl esters (LOVAZA) 1 G capsule Take 1 g by mouth 2 (two) times daily.    [name] Kitchen omeprazole (PRILOSEC) 20 MG capsule Take 20 mg by mouth daily.    Vladimir Faster Glycol-Propyl Glycol (SYSTANE OP) Apply 1 drop to eye at bedtime.    . rosuvastatin (CRESTOR) 10 MG tablet Take 10 mg by mouth every evening.     . sitaGLIPtin (JANUVIA) 100 MG tablet Take 100 mg by mouth daily.    . valsartan (DIOVAN) 320 MG tablet Take 320 mg by mouth every morning.     No current facility-administered medications on file prior to visit.   Family History  Problem Relation Age of Onset  . Cancer Mother     stomach ca  . Hypertension Mother   . Cancer Father     prostate ca  . Diabetes  Father   . Diabetes Sister   . Hypertension Brother y-10   History   Social History  . Marital Status: Married    Spouse Name: N/A    Number of Children: N/A  . Years of Education: N/A   Occupational History  . Not on file.   Social History Main Topics  . Smoking status: Never Smoker   . Smokeless tobacco: Never Used  . Alcohol Use: Yes     Comment: rare social  . Drug Use: Yes    Special: Solvent inhalants  . Sexual Activity: Not Currently   Other Topics Concern  . Not on file   Social History Narrative    Review of Systems: Constitutional: Negative for fever, chills, diaphoresis, activity change, appetite change and fatigue. HENT: Negative for ear pain, nosebleeds, congestion, facial swelling, rhinorrhea, neck pain, neck stiffness and ear discharge.  Eyes: Negative for pain, discharge, redness, itching and visual disturbance. Cardiovascular: Negative for chest pain, palpitations and leg swelling. Gastrointestinal: Negative for abdominal distention. Genitourinary: Negative for dysuria, urgency, frequency, hematuria, flank pain, decreased urine volume, difficulty urinating and dyspareunia.  Musculoskeletal: Negative for back pain, joint swelling, arthralgia and gait problem. Neurological: Negative for dizziness, tremors, seizures, syncope, facial asymmetry, speech difficulty, weakness, light-headedness, numbness and headaches.  Hematological: Negative for adenopathy. Does not bruise/bleed easily. Psychiatric/Behavioral: Negative for hallucinations, behavioral problems, confusion, dysphoric mood, decreased concentration and agitation.     Objective:    Filed Vitals:   09/09/14 1559  BP: 139/65  Pulse: 81  Temp: 97.9 F (36.6 C)  Resp: 16    Physical Exam: Constitutional: Patient appears well-developed and well-nourished. No distress. HENT: Normocephalic, atraumatic, External right and left ear normal. Oropharynx is clear and moist.  Eyes: Conjunctivae and EOM  are normal. PERRLA, no scleral icterus. Neck: Normal ROM. Neck supple. No JVD. No tracheal deviation. No thyromegaly. Pulmonary: Effort and breath sounds normal, no stridor, rhonchi, wheezes, rales.  Abdominal: Soft. BS +, no distension,  Mild supra-pubic tenderness.tenderness, rebound or guarding.  Musculoskeletal: Normal range of motion. No edema and no tenderness.  Neuro: Alert. Normal reflexes, muscle tone coordination. No cranial nerve deficit. Skin: Skin is warm and dry. No rash noted. Not diaphoretic. No erythema. No pallor. Psychiatric: Normal mood and affect. Behavior, judgment, thought content normal.   Lab Results  Component Value Date   WBC 3.1* 09/05/2014   HGB 13.4 09/05/2014   HCT 42.5 09/05/2014   MCV 85.9 09/05/2014   PLT 361 09/05/2014   Lab Results  Component Value Date   CREATININE 0.84 08/25/2014   BUN 22 08/25/2014  NA 136 08/25/2014   K 3.6 08/25/2014   CL 95* 08/25/2014   CO2 27 08/25/2014    No results found for: HGBA1C Lipid Panel     Component Value Date/Time   CHOL  05/09/2009 0916    75        ATP III CLASSIFICATION:  <200     mg/dL   Desirable  200-239  mg/dL   Borderline High  >=240    mg/dL   High          TRIG 44 05/09/2009 0916   HDL 39* 05/09/2009 0916   CHOLHDL 1.9 05/09/2009 0916   VLDL 9 05/09/2009 0916   LDLCALC  05/09/2009 0916    27        Total Cholesterol/HDL:CHD Risk Coronary Heart Disease Risk Table                     Men   Women  1/2 Average Risk   3.4   3.3  Average Risk       5.0   4.4  2 X Average Risk   9.6   7.1  3 X Average Risk  23.4   11.0        Use the calculated Patient Ratio above and the CHD Risk Table to determine the patient's CHD Risk.        ATP III CLASSIFICATION (LDL):  <100     mg/dL   Optimal  100-129  mg/dL   Near or Above                    Optimal  130-159  mg/dL   Borderline  160-189  mg/dL   High  >190     mg/dL   Very High       Assessment and plan:  1. Abdominal pain,  unspecified abdominal location - Pt has no significant tenderness at present. However I agree with there CT abdomen and pelvis and referral to Gyn-Onc in light of her personal history of ovarian and Uterine Cancer in a setting of post-menopausal vaginal bleeding. She could certainly have a UTI but this is less likely after having a course of Levaquin. However if the pain persists will obtain a urine culture  2. Multiple thyroid nodules - Pty reports thyroid whic were being followed by Dr. Harlow Asa of General Surgery. However the last time she was seen by him was 2010. She is unclear about the surveilllance plan.  3. Diabetes mellitus type 2 in obese - Pt is on multiple medications and is followed by Dr. Elyse Hsu. She reports that her last Hb A1c was 6.2 two months ago.  - Will request labs from Dr. Elyse Hsu. - Needs foot examination on next visit  4. Obese - Will discuss on next visit.  5. Hyperlipidemia - Pt's last labs do not reflect hyperlipidemia but she is not sure what her initial Cholesterol was. Additionally she has a strong family history of PAD/CAD with her brother having an MI prematurely.  6. Benign essential HTN - BP appears to be well controlled on Valsartan and HCTZ.   7. Reactive airway disease that is not asthma - Pt was recently treated for Acute Bronchitis but is reporting occasional wheezing. Will order Albuterol inhaler.  8. Hypothyroidism - managed by Dr. Elyse Hsu.  Meds ordered this encounter  Medications  . albuterol (PROVENTIL HFA;VENTOLIN HFA) 108 (90 BASE) MCG/ACT inhaler    Sig: Inhale 2 puffs into the lungs every 6 (six)  hours as needed for wheezing or shortness of breath.    Dispense:  1 Inhaler    Refill:  0    Return in about 4 weeks (around 10/07/2014) for Thyroid Nodule, Annual Physical, HTN.  The patient was given clear instructions to go to ER or return to medical center if symptoms don't improve, worsen or new problems develop. The patient  verbalized understanding. The patient was told to call to get lab results if they haven't heard anything in the next week.     This note has been created with Surveyor, quantity. Any transcriptional errors are unintentional.    Jailyn Langhorst A., MD [place], [place]   09/09/2014, 5:40 PM

## 2014-09-12 ENCOUNTER — Ambulatory Visit: Payer: BLUE CROSS/BLUE SHIELD

## 2014-09-12 ENCOUNTER — Ambulatory Visit (HOSPITAL_COMMUNITY)
Admission: RE | Admit: 2014-09-12 | Discharge: 2014-09-12 | Disposition: A | Discharge status: Home / Self Care | Payer: BLUE CROSS/BLUE SHIELD | Source: Ambulatory Visit | Attending: Hematology and Oncology | Admitting: Hematology and Oncology

## 2014-09-12 ENCOUNTER — Encounter: Payer: Self-pay | Admitting: *Deleted

## 2014-09-12 ENCOUNTER — Telehealth: Payer: Self-pay | Admitting: *Deleted

## 2014-09-12 ENCOUNTER — Other Ambulatory Visit (HOSPITAL_BASED_OUTPATIENT_CLINIC_OR_DEPARTMENT_OTHER): Payer: BLUE CROSS/BLUE SHIELD

## 2014-09-12 ENCOUNTER — Encounter (HOSPITAL_COMMUNITY): Payer: Self-pay

## 2014-09-12 DIAGNOSIS — R102 Pelvic and perineal pain: Secondary | ICD-10-CM | POA: Diagnosis present

## 2014-09-12 DIAGNOSIS — R1900 Intra-abdominal and pelvic swelling, mass and lump, unspecified site: Secondary | ICD-10-CM | POA: Diagnosis not present

## 2014-09-12 DIAGNOSIS — N950 Postmenopausal bleeding: Secondary | ICD-10-CM | POA: Insufficient documentation

## 2014-09-12 DIAGNOSIS — Z9221 Personal history of antineoplastic chemotherapy: Secondary | ICD-10-CM | POA: Insufficient documentation

## 2014-09-12 DIAGNOSIS — Z8543 Personal history of malignant neoplasm of ovary: Secondary | ICD-10-CM

## 2014-09-12 DIAGNOSIS — D709 Neutropenia, unspecified: Secondary | ICD-10-CM

## 2014-09-12 DIAGNOSIS — Z9049 Acquired absence of other specified parts of digestive tract: Secondary | ICD-10-CM | POA: Diagnosis not present

## 2014-09-12 DIAGNOSIS — Z90710 Acquired absence of both cervix and uterus: Secondary | ICD-10-CM | POA: Insufficient documentation

## 2014-09-12 DIAGNOSIS — D708 Other neutropenia: Secondary | ICD-10-CM

## 2014-09-12 LAB — COMPREHENSIVE METABOLIC PANEL (CC13)
ALK PHOS: 68 U/L (ref 40–150)
ALT: 30 U/L (ref 0–55)
AST: 19 U/L (ref 5–34)
Albumin: 3.6 g/dL (ref 3.5–5.0)
Anion Gap: 13 mEq/L — ABNORMAL HIGH (ref 3–11)
BILIRUBIN TOTAL: 0.41 mg/dL (ref 0.20–1.20)
BUN: 15.4 mg/dL (ref 7.0–26.0)
CO2: 28 mEq/L (ref 22–29)
Calcium: 9.5 mg/dL (ref 8.4–10.4)
Chloride: 100 mEq/L (ref 98–109)
Creatinine: 0.7 mg/dL (ref 0.6–1.1)
EGFR: 89 mL/min/{1.73_m2} — ABNORMAL LOW (ref >90)
Glucose: 121 mg/dl (ref 70–140)
Potassium: 4 mEq/L (ref 3.5–5.1)
Sodium: 141 mEq/L (ref 136–145)
TOTAL PROTEIN: 7.1 g/dL (ref 6.4–8.3)

## 2014-09-12 LAB — CBC WITH DIFFERENTIAL/PLATELET
BASO%: 1.8 % (ref 0.0–2.0)
Basophils Absolute: 0.1 10*3/uL (ref 0.0–0.1)
EOS%: 2.8 % (ref 0.0–7.0)
Eosinophils Absolute: 0.1 10*3/uL (ref 0.0–0.5)
HEMATOCRIT: 41.5 % (ref 34.8–46.6)
HEMOGLOBIN: 12.9 g/dL (ref 11.6–15.9)
LYMPH%: 29.5 % (ref 14.0–49.7)
MCH: 26.6 pg (ref 25.1–34.0)
MCHC: 31.1 g/dL — ABNORMAL LOW (ref 31.5–36.0)
MCV: 85.6 fL (ref 79.5–101.0)
MONO#: 0.9 10*3/uL (ref 0.1–0.9)
MONO%: 18.4 % — ABNORMAL HIGH (ref 0.0–14.0)
NEUT#: 2.3 10*3/uL (ref 1.5–6.5)
NEUT%: 47.5 % (ref 38.4–76.8)
Platelets: 302 10*3/uL (ref 145–400)
RBC: 4.85 10*6/uL (ref 3.70–5.45)
RDW: 16 % — ABNORMAL HIGH (ref 11.2–14.5)
WBC: 4.9 10*3/uL (ref 3.9–10.3)
lymph#: 1.4 10*3/uL (ref 0.9–3.3)

## 2014-09-12 MED ORDER — TBO-FILGRASTIM 480 MCG/0.8ML ~~LOC~~ SOSY: 480.0000 ug | PREFILLED_SYRINGE | Freq: Every day | SUBCUTANEOUS | Status: DC

## 2014-09-12 MED ORDER — IOHEXOL 300 MG/ML  SOLN
100.0000 mL | Freq: Once | INTRAMUSCULAR | Status: AC | PRN
  Administered 2014-09-12: 100 mL via INTRAVENOUS

## 2014-09-12 NOTE — Telephone Encounter (Signed)
RESULTS REPORTED AND FAXED TO TRIAGE. THIS REPORT WAS TAKEN TO THE PHYSICIAN ON CALL, DR.MAGRINAT. ALSO ROUTED THIS MESSAGE TO DR.GORSUCH'S NURSE, CAMEO [place]AM,RN. DR.GORSUCH IS OUT OF THE OFFICE THIS WEEK.

## 2014-09-12 NOTE — Telephone Encounter (Signed)
CT scan was reviewed by Dr. Jana Hakim and by Joylene John, NP.   Melissa moved pt's appt w/ Dr. Denman [name] up from 3/4 to 2/19 based on results of CT scan.

## 2014-09-12 NOTE — Progress Notes (Signed)
[name] here for possible Granix injection.  ANC today is 2.3,  To be held if [facility] is greater than 1.5.

## 2014-09-12 NOTE — Progress Notes (Signed)
S/w pt in Injection Room.  She does not need any Granix today or tomorrow as her [place] is 2.3.   She denies any fevers.  She reports she is "feeling better" from her Resp infection, although not completely recovered yet.    Pt scheduled for IVIG tomorrow.   We discussed since she is feeling better and her ANC is improved that we will cancel this appt for IVIG.   Pt sees Dr. Alvy Bimler next week.  Informed pt that the IVIG  can always be rescheduled by Dr. Alvy Bimler in the future if needed.   Pt verbalized understanding and agrees w/ plan to cancel IVIG this week.  She knows to call for any worsening or new symptoms.

## 2014-09-13 ENCOUNTER — Telehealth: Payer: Self-pay | Admitting: *Deleted

## 2014-09-13 ENCOUNTER — Ambulatory Visit: Payer: BLUE CROSS/BLUE SHIELD

## 2014-09-13 LAB — CA 125: CA 125: 10 U/mL (ref <35)

## 2014-09-13 LAB — CA 125(PREVIOUS METHOD): CA 125: 9 U/mL (ref 0.0–30.2)

## 2014-09-13 NOTE — Telephone Encounter (Signed)
PT. STATES CAMEO WAS TO CALL HER RESULTS.

## 2014-09-13 NOTE — Telephone Encounter (Signed)
Informed pt of her Ca 125 results.  Informed her Dr. Alvy Bimler will return Monday and will have results on CT scan on Monday.  She verbalized understanding.

## 2014-09-19 ENCOUNTER — Other Ambulatory Visit: Payer: Self-pay | Admitting: Hematology and Oncology

## 2014-09-19 DIAGNOSIS — D709 Neutropenia, unspecified: Secondary | ICD-10-CM

## 2014-09-20 ENCOUNTER — Ambulatory Visit: Payer: BLUE CROSS/BLUE SHIELD | Attending: Gynecologic Oncology | Admitting: Gynecologic Oncology

## 2014-09-20 ENCOUNTER — Ambulatory Visit (HOSPITAL_BASED_OUTPATIENT_CLINIC_OR_DEPARTMENT_OTHER): Payer: BLUE CROSS/BLUE SHIELD | Admitting: Hematology and Oncology

## 2014-09-20 ENCOUNTER — Other Ambulatory Visit (HOSPITAL_BASED_OUTPATIENT_CLINIC_OR_DEPARTMENT_OTHER): Payer: BLUE CROSS/BLUE SHIELD

## 2014-09-20 ENCOUNTER — Encounter: Payer: Self-pay | Admitting: Gynecologic Oncology

## 2014-09-20 ENCOUNTER — Telehealth: Payer: Self-pay | Admitting: Hematology and Oncology

## 2014-09-20 ENCOUNTER — Other Ambulatory Visit: Payer: Self-pay | Admitting: *Deleted

## 2014-09-20 ENCOUNTER — Encounter: Payer: Self-pay | Admitting: Hematology and Oncology

## 2014-09-20 ENCOUNTER — Ambulatory Visit (HOSPITAL_BASED_OUTPATIENT_CLINIC_OR_DEPARTMENT_OTHER): Payer: BLUE CROSS/BLUE SHIELD

## 2014-09-20 VITALS — BP 144/64 | HR 82 | Temp 97.5°F | Resp 18 | Ht 63.0 in | Wt 252.1 lb

## 2014-09-20 DIAGNOSIS — D709 Neutropenia, unspecified: Secondary | ICD-10-CM | POA: Insufficient documentation

## 2014-09-20 DIAGNOSIS — Z90722 Acquired absence of ovaries, bilateral: Secondary | ICD-10-CM | POA: Insufficient documentation

## 2014-09-20 DIAGNOSIS — Z8543 Personal history of malignant neoplasm of ovary: Secondary | ICD-10-CM

## 2014-09-20 DIAGNOSIS — Z9221 Personal history of antineoplastic chemotherapy: Secondary | ICD-10-CM | POA: Diagnosis not present

## 2014-09-20 DIAGNOSIS — E039 Hypothyroidism, unspecified: Secondary | ICD-10-CM | POA: Insufficient documentation

## 2014-09-20 DIAGNOSIS — R198 Other specified symptoms and signs involving the digestive system and abdomen: Secondary | ICD-10-CM

## 2014-09-20 DIAGNOSIS — K219 Gastro-esophageal reflux disease without esophagitis: Secondary | ICD-10-CM | POA: Diagnosis not present

## 2014-09-20 DIAGNOSIS — N7681 Mucositis (ulcerative) of vagina and vulva: Secondary | ICD-10-CM | POA: Insufficient documentation

## 2014-09-20 DIAGNOSIS — Z90710 Acquired absence of both cervix and uterus: Secondary | ICD-10-CM | POA: Diagnosis not present

## 2014-09-20 DIAGNOSIS — D708 Other neutropenia: Secondary | ICD-10-CM

## 2014-09-20 DIAGNOSIS — Z7982 Long term (current) use of aspirin: Secondary | ICD-10-CM | POA: Insufficient documentation

## 2014-09-20 DIAGNOSIS — K6289 Other specified diseases of anus and rectum: Secondary | ICD-10-CM

## 2014-09-20 DIAGNOSIS — I10 Essential (primary) hypertension: Secondary | ICD-10-CM | POA: Insufficient documentation

## 2014-09-20 DIAGNOSIS — Z9049 Acquired absence of other specified parts of digestive tract: Secondary | ICD-10-CM | POA: Diagnosis not present

## 2014-09-20 DIAGNOSIS — E119 Type 2 diabetes mellitus without complications: Secondary | ICD-10-CM | POA: Diagnosis not present

## 2014-09-20 DIAGNOSIS — Z79899 Other long term (current) drug therapy: Secondary | ICD-10-CM | POA: Diagnosis not present

## 2014-09-20 DIAGNOSIS — N9489 Other specified conditions associated with female genital organs and menstrual cycle: Secondary | ICD-10-CM

## 2014-09-20 DIAGNOSIS — N939 Abnormal uterine and vaginal bleeding, unspecified: Secondary | ICD-10-CM | POA: Diagnosis present

## 2014-09-20 LAB — CBC WITH DIFFERENTIAL/PLATELET
BASO%: 1.3 % (ref 0.0–2.0)
Basophils Absolute: 0 10*3/uL (ref 0.0–0.1)
EOS ABS: 0.1 10*3/uL (ref 0.0–0.5)
EOS%: 1.9 % (ref 0.0–7.0)
HCT: 42.5 % (ref 34.8–46.6)
HEMOGLOBIN: 13.6 g/dL (ref 11.6–15.9)
LYMPH%: 49 % (ref 14.0–49.7)
MCH: 28 pg (ref 25.1–34.0)
MCHC: 32 g/dL (ref 31.5–36.0)
MCV: 87.4 fL (ref 79.5–101.0)
MONO#: 0.6 10*3/uL (ref 0.1–0.9)
MONO%: 20 % — ABNORMAL HIGH (ref 0.0–14.0)
NEUT%: 27.8 % — ABNORMAL LOW (ref 38.4–76.8)
NEUTROS ABS: 0.9 10*3/uL — AB (ref 1.5–6.5)
Platelets: 275 10*3/uL (ref 145–400)
RBC: 4.86 10*6/uL (ref 3.70–5.45)
RDW: 15.8 % — AB (ref 11.2–14.5)
WBC: 3.1 10*3/uL — AB (ref 3.9–10.3)
lymph#: 1.5 10*3/uL (ref 0.9–3.3)

## 2014-09-20 MED ORDER — TBO-FILGRASTIM 480 MCG/0.8ML ~~LOC~~ SOSY
480.0000 ug | PREFILLED_SYRINGE | SUBCUTANEOUS | Status: DC
  Administered 2014-09-20: 480 ug via SUBCUTANEOUS
  Filled 2014-09-20: qty 0.8

## 2014-09-20 NOTE — Patient Instructions (Signed)
Appointment with Dr. Carma Leaven office today at 3:30pm. We will contact [facility] for additional records and we will call you for a follow up appointment.

## 2014-09-20 NOTE — Telephone Encounter (Signed)
Pt confirmed labs/ov/inj per 02/19 POF, gave pt AVS... KJ

## 2014-09-20 NOTE — Progress Notes (Signed)
Consult Note: Gyn-Onc  Consult was requested by Dr. Alvy Bimler for the evaluation of Derrick Iannuzzi 59 y.o. female with a history of ovarian and endometrial cancer and a new rectovaginal mass.  CC:  Chief Complaint  Patient presents with  . New Patient    Spotting, previous ovarian cancer    Assessment/Plan:  Ms. Miku Udall  is a 59 y.o.  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  We'll await the results of today's biopsy of the vaginal lesion, though the lesion appeared to be mostly submucosal and the biopsy may yield normal vaginal mucosa.  I will obtain pathology reports from her 2006 surgery at [facility] to confirm the original diagnosis.  We will facilitate scheduling a colonoscopy to evaluate the rectum, potentially biopsy the mass, and rule out primary rectal cancer.  If this mass represents a recurrence of endometrial cancer she may be a candidate for salvage radiation therapy however this would require both external beam and interstitial brachial therapy. I've concerns about his ability to completely sterilized the mass of the size. Additionally we would usually recommend chemotherapy and I have concerns about her ability to tolerate this with her underlying diagnosis of chronic idiopathic neutropenia.  Alternatively, primary surgical management would require an upper vaginectomy and low anterior resection. While this may minimize the need for chemotherapy and radiation (which may be difficult to administer in this patient given her chronic idiopathic neutropenia) this surgery presents particular risk in this patient due to the following factors #1 her morbid obesity, #2 her diabetes #3 her chronic neutropenia #4 the very low nature of the lesion and subsequent anastomosis. All of these risk factors place her at increased risk for postoperative complication including anastamotic leak, or severe  infection including potentially life threatening infection. Due to the anticipated technical difficulties such a procedure I am going to request the assistance of a colorectal surgical expert, Dr. Michael [name] in the surgery to minimize the liklihood that she will require colostomy.   After I have gathered all of the above information and discussed her case with her providers we will determine a definitive plan for management of this mass.  HPI: Josefina Rynders is a 59 year old woman with a history of endometrial and ovarian endometrioid carcinoma treated in 2006 by Dr. [name] at [facility] in [place]. Her surgery (TAH, BSO) was followed by adjuvant chemotherapy with carboplatin plus paclitaxel. She denies receiving adjuvant radiation therapy. She is unclear if she had metastatic endometrial cancer to the ovary or duel primaries. She had a complete response to therapy however developed leukopenia in July 2010. 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 She sees Dr. Simeon Craft such for this and is treated with G-CSF injections.  She began experiencing rectal pain approximately 3 months ago. She also reports narrowing of caliber of the stool. She denies hematochezia. She does report approximately 3 months of vaginal spotting. She's had no specific follow-up for her gynecologic cancers in the past 4 years.  As part of workup of her rectal pain she underwent a CT scan of the abdomen and pelvis on 09/12/2014. This demonstrated a new right perirectal mass abutting the vaginal cuff measuring 3.8 x 4.9 cm. There is a limited fat plane between the mass and the rectum posteriorly. Rectal invasion could not be excluded. There were no other masses identified  in the abdomen and pelvis or lymphadenopathy. There is no other evidence of metastatic disease or recurrent disease. There was no  hydronephrosis. A CA-125 drawn on 09/12/2014 was normal at 10.  Interval History: Continues to have light vaginal spotting and rectal pain.  Current Meds:  Outpatient Encounter Prescriptions as of 09/20/2014  Medication Sig  . albuterol (PROVENTIL HFA;VENTOLIN HFA) 108 (90 BASE) MCG/ACT inhaler Inhale 2 puffs into the lungs every 6 (six) hours as needed for wheezing or shortness of breath.  [name] Kitchen aspirin EC 81 MG tablet Take 81 mg by mouth daily.  . Calcium Carbonate-Vitamin D (CALCIUM + D PO) Take 2 tablets by mouth daily.  . Canagliflozin (INVOKANA) 300 MG TABS Take 1 tablet by mouth every morning.   . Cholecalciferol (VITAMIN D3) 10000 UNITS capsule Take 10,000 Units by mouth once a week. Sundays  . fluconazole (DIFLUCAN) 100 MG tablet Take 1 tablet (100 mg total) by mouth once. May repeat prn  . hydrochlorothiazide (HYDRODIURIL) 12.5 MG tablet Take 12.5 mg by mouth every morning.   [name] Kitchen levothyroxine (SYNTHROID) 175 MCG tablet Take 175 mcg by mouth daily before breakfast.  . metFORMIN (GLUCOPHAGE) 1000 MG tablet Take 1,000 mg by mouth 2 (two) times daily with a meal.   . Multiple Vitamin (MULTIVITAMIN WITH MINERALS) TABS tablet Take 1 tablet by mouth daily.  . naproxen sodium (ALEVE) 220 MG tablet Take 220 mg by mouth at bedtime as needed (Pain).  [name] Kitchen omega-3 acid ethyl esters (LOVAZA) 1 G capsule Take 1 g by mouth 2 (two) times daily.  [name] Kitchen omeprazole (PRILOSEC) 20 MG capsule Take 20 mg by mouth daily.  Vladimir Faster Glycol-Propyl Glycol (SYSTANE OP) Apply 1 drop to eye at bedtime.  . rosuvastatin (CRESTOR) 10 MG tablet Take 10 mg by mouth every evening.   . sitaGLIPtin (JANUVIA) 100 MG tablet Take 100 mg by mouth daily.  . valsartan (DIOVAN) 320 MG tablet Take 320 mg by mouth every morning.    Allergy:  Allergies  Allergen Reactions  . Penicillins Swelling    Facial swelling  . Adhesive [Tape]     blisters  . Cefaclor Rash    Ceclor  . Erythromycin     gastritis  . Trimethoprim Rash  .  Sulfa Antibiotics Rash    Social Hx:   History   Social History  . Marital Status: Married    Spouse Name: N/A  . Number of Children: N/A  . Years of Education: N/A   Occupational History  . Not on file.   Social History Main Topics  . Smoking status: Never Smoker   . Smokeless tobacco: Never Used  . Alcohol Use: Yes     Comment: rare social  . Drug Use: Yes    Special: Solvent inhalants  . Sexual Activity: Not Currently   Other Topics Concern  . Not on file   Social History Narrative    Past Surgical Hx:  Past Surgical History  Procedure Laterality Date  . Appendectomy    . Tonsillectomy    . Cholecystectomy      laparoscopic  . Eye surgery      pytosis of eyelids-child  . Colonoscopy with propofol N/A 08/21/2013    Procedure: COLONOSCOPY WITH PROPOFOL;  Surgeon: Cleotis Nipper, MD;  Location: [facility];  Service: Endoscopy;  Laterality: N/A;  . Abdominal hysterectomy      staging for Ovarian cancer    Past Medical Hx:  Past Medical History  Diagnosis Date  . Hypothyroidism  12/14/2011  . Benign essential HTN 12/14/2011  . Neutropenia   . Postmenopausal bleeding 09/05/2014  . Anemia     Planned Neupogen injections for chronic neutropenia  . Transfusion history     '06-s/p hysterectomy and staging  . Multiple thyroid nodules y-8    Managed by Dr. Harlow Asa  . GERD (gastroesophageal reflux disease)   . Hiatal hernia y-12  . Polyp of duodenum y-2  . Cancer     '06-Ovarian/ Uterine Cancer-chemotherapy  . DM type 2 (diabetes mellitus, type 2) 12/14/2011    Past Gynecological History:  G0. Endometrial and ovarian cancer s/p TAH, BSO and chemotherapy  With carboplatin and paclitaxel  No LMP recorded. Patient has had a hysterectomy.  Family Hx:  Family History  Problem Relation Age of Onset  . Cancer Mother     stomach ca  . Hypertension Mother   . Cancer Father     prostate ca  . Diabetes Father   . Diabetes Sister   . Hypertension Brother y-10     Review of Systems:  Constitutional  Feels well,    ENT Normal appearing ears and nares bilaterally Skin/Breast  No rash, sores, jaundice, itching, dryness Cardiovascular  No chest pain, shortness of breath, or edema  Pulmonary  No cough or wheeze.  Gastro Intestinal  No nausea, vomitting, or diarrhoea. No bright red blood per rectum, no abdominal pain, + in bowel movement (narrowed stool callibre), no constipation.  Genito Urinary  No frequency, urgency, dysuria, + vaginal spotting Musculo Skeletal  No myalgia, arthralgia, joint swelling or pain  Neurologic  No weakness, numbness, change in gait,  Psychology  No depression, anxiety, insomnia.   Vitals:  141/74, pulse 84, weight 251, temp 98.2, RR 18  Physical Exam: WD in NAD Neck  Supple NROM, without any enlargements.  Lymph Node Survey No cervical supraclavicular or inguinal adenopathy Cardiovascular  Pulse normal rate, regularity and rhythm. S1 and S2 normal.  Lungs  Clear to auscultation bilateraly, without wheezes/crackles/rhonchi. Good air movement.  Skin  No rash/lesions/breakdown  Psychiatry  Alert and oriented to person, place, and time  Abdomen  Normoactive bowel sounds, abdomen soft, non-tender and morbidly obese without evidence of hernia.  Back No CVA tenderness Genito Urinary  Vulva/vagina: Normal external female genitalia.  No lesions. No discharge or bleeding.  Bladder/urethra:  No lesions or masses, well supported bladder  Vagina: erythematous nodular area at right vaginal apex. Biopsied with kevorkian biopsy forceps. Hemostatic.  Cervix: surgically absent  Uterus: surgically absent   Adnexa: no adnexal masses. However a 5 cm mass is appreciated in the midline adherent to the vagina and rectum. It is irregular in shape, cauliflower-like. It is mobile. It is nontender. Rectal  Good tone, rectovaginal mass appreciated (as above) approximately 4-5cm from anal verge. It is easily appreciated with  the examining hand.  Extremities  No bilateral cyanosis, clubbing or edema.   Donaciano Eva, MD   09/20/2014, 5:20 PM

## 2014-09-21 NOTE — Progress Notes (Signed)
[facility] OFFICE PROGRESS NOTE  Patient Care Team: Leana Gamer, MD as PCP - General (Internal Medicine) [name], MD as Referring Physician (Obstetrics and Gynecology) Heath Lark, MD as Consulting Physician (Hematology and Oncology)  SUMMARY OF ONCOLOGIC HISTORY:  She is a retired Marine scientist with chronic idiopathic neutropenia presumed related to previous chemotherapy for 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 Going to the patient, initial diagnosis with ovarian cancer was when she presented with abdomen and no swelling and pelvic pain leading to her urgent care visit. She states that the tumor had caused torsion of the ovary. She cannot remember the stage of her disease. She began to develop leukopenia in approximately July of 2010. She underwent an initial bone marrow biopsy at [facility] in [place] on 03/17/2009. but it was nondiagnostic. No gross dysplastic changes, no excess blasts, normal cytogenetics.  Her counts have been stable over observation through this office since December 2010 with some minor fluctuations. Total white counts run as low as 1800 and as high as 2900. Percent neutrophils fluctuates very widely from as low as 4% recorded on a CBC done 02/03/2010 to as high as 31% with average being 20%. She was hospitalized in the ICU at that time  Both hemoglobin and platelets have remained stable over time.  Despite leukopenia, she has not had any recurrent or severe infections. She stated strong family history of lupus in a cousin. She complained of occasional skin rash and joint stiffness. 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 She received G-CSF in January of 2015 to stimulate white blood cell production around a colonoscopy procedure a few years ago. She had a limited response. On  01/23/2014, repeat bone marrow biopsy excluded lymphoma or leukemia. On 09/12/2014, CT scan of the abdomen and pelvis show possible relapse of gynecological Malignancy INTERVAL HISTORY: Please see below for problem oriented charting. The patient is tearful. She continued to have persistent pressure discomfort in the rectum region. She denies further infection. Have very mild persistent cough but better since I saw her last  REVIEW OF SYSTEMS:   Constitutional: Denies fevers, chills or abnormal weight loss Eyes: Denies blurriness of vision Ears, nose, mouth, throat, and face: Denies mucositis or sore throat Cardiovascular: Denies palpitation, chest discomfort or lower extremity swelling Gastrointestinal:  Denies nausea, heartburn or change in bowel habits Skin: Denies abnormal skin rashes Lymphatics: Denies new lymphadenopathy or easy bruising Neurological:Denies numbness, tingling or new weaknesses Behavioral/Psych: Mood is stable, no new changes  All other systems were reviewed with the patient and are negative.  I have reviewed the past medical history, past surgical history, social history and family history with the patient and they are unchanged from previous note.  ALLERGIES:  is allergic to penicillins; adhesive; cefaclor; erythromycin; trimethoprim; and sulfa antibiotics.  MEDICATIONS:  Current Outpatient Prescriptions  Medication Sig Dispense Refill  . albuterol (PROVENTIL HFA;VENTOLIN HFA) 108 (90 BASE) MCG/ACT inhaler Inhale 2 puffs into the lungs every 6 (six) hours as needed for wheezing or shortness of breath. 1 Inhaler 0  . aspirin EC 81 MG tablet Take 81 mg by mouth daily.    . Calcium Carbonate-Vitamin D (CALCIUM + D PO) Take 2 tablets by mouth daily.    . Canagliflozin (INVOKANA) 300 MG TABS Take 1 tablet by mouth every morning.     . Cholecalciferol (VITAMIN D3) 10000 UNITS capsule  Take 10,000 Units by mouth once a week. Sundays    . fluconazole (DIFLUCAN) 100 MG  tablet Take 1 tablet (100 mg total) by mouth once. May repeat prn 2 tablet 0  . hydrochlorothiazide (HYDRODIURIL) 12.5 MG tablet Take 12.5 mg by mouth every morning.     [name] Kitchen levothyroxine (SYNTHROID) 175 MCG tablet Take 175 mcg by mouth daily before breakfast.    . metFORMIN (GLUCOPHAGE) 1000 MG tablet Take 1,000 mg by mouth 2 (two) times daily with a meal.     . Multiple Vitamin (MULTIVITAMIN WITH MINERALS) TABS tablet Take 1 tablet by mouth daily.    . naproxen sodium (ALEVE) 220 MG tablet Take 220 mg by mouth at bedtime as needed (Pain).    [name] Kitchen omega-3 acid ethyl esters (LOVAZA) 1 G capsule Take 1 g by mouth 2 (two) times daily.    [name] Kitchen omeprazole (PRILOSEC) 20 MG capsule Take 20 mg by mouth daily.    . pantoprazole (PROTONIX) 40 MG tablet Take 40 mg by mouth.    Vladimir Faster Glycol-Propyl Glycol (SYSTANE OP) Apply 1 drop to eye at bedtime.    . rosuvastatin (CRESTOR) 10 MG tablet Take 10 mg by mouth every evening.     . sitaGLIPtin (JANUVIA) 100 MG tablet Take 100 mg by mouth daily.    . valsartan (DIOVAN) 320 MG tablet Take 320 mg by mouth every morning.     No current facility-administered medications for this visit.    PHYSICAL EXAMINATION: ECOG PERFORMANCE STATUS: 1 - Symptomatic but completely ambulatory  Filed Vitals:   09/20/14 1222  BP: 144/64  Pulse: 82  Temp: 97.5 F (36.4 C)  Resp: 18   Filed Weights   09/20/14 1222  Weight: 252 lb 1.6 oz (114.352 kg)    GENERAL:alert, no distress and comfortable SKIN: skin color, texture, turgor are normal, no rashes or significant lesions EYES: normal, Conjunctiva are pink and non-injected, sclera clear Musculoskeletal:no cyanosis of digits and no clubbing  NEURO: alert & oriented x 3 with fluent speech, no focal motor/sensory deficits  LABORATORY DATA:  I have reviewed the data as listed    Component Value Date/Time   NA 141 09/12/2014 0857   NA 136 08/25/2014 1538   K 4.0 09/12/2014 0857   K 3.6 08/25/2014 1538   CL 95*  08/25/2014 1538   CO2 28 09/12/2014 0857   CO2 27 08/25/2014 1538   GLUCOSE 121 09/12/2014 0857   GLUCOSE 108* 08/25/2014 1538   BUN 15.4 09/12/2014 0857   BUN 22 08/25/2014 1538   CREATININE 0.7 09/12/2014 0857   CREATININE 0.84 08/25/2014 1538   CREATININE 0.87 06/29/2010 1532   CALCIUM 9.5 09/12/2014 0857   CALCIUM 9.8 08/25/2014 1538   PROT 7.1 09/12/2014 0857   PROT 6.9 08/25/2014 1538   ALBUMIN 3.6 09/12/2014 0857   ALBUMIN 3.9 08/25/2014 1538   AST 19 09/12/2014 0857   AST 19 08/25/2014 1538   ALT 30 09/12/2014 0857   ALT 28 08/25/2014 1538   ALKPHOS 68 09/12/2014 0857   ALKPHOS 60 08/25/2014 1538   BILITOT 0.41 09/12/2014 0857   BILITOT 0.5 08/25/2014 1538   GFRNONAA 76 08/25/2014 1538   GFRNONAA >60 04/19/2010 2037   GFRAA 87 08/25/2014 1538   GFRAA  04/19/2010 2037    >60        The eGFR has been calculated using the MDRD equation. This calculation has not been validated in all clinical situations. eGFR's persistently <60 mL/min signify possible Chronic Kidney  Disease.    No results found for: SPEP, UPEP  Lab Results  Component Value Date   WBC 3.1* 09/20/2014   NEUTROABS 0.9* 09/20/2014   HGB 13.6 09/20/2014   HCT 42.5 09/20/2014   MCV 87.4 09/20/2014   PLT 275 09/20/2014      Chemistry      Component Value Date/Time   NA 141 09/12/2014 0857   NA 136 08/25/2014 1538   K 4.0 09/12/2014 0857   K 3.6 08/25/2014 1538   CL 95* 08/25/2014 1538   CO2 28 09/12/2014 0857   CO2 27 08/25/2014 1538   BUN 15.4 09/12/2014 0857   BUN 22 08/25/2014 1538   CREATININE 0.7 09/12/2014 0857   CREATININE 0.84 08/25/2014 1538   CREATININE 0.87 06/29/2010 1532      Component Value Date/Time   CALCIUM 9.5 09/12/2014 0857   CALCIUM 9.8 08/25/2014 1538   ALKPHOS 68 09/12/2014 0857   ALKPHOS 60 08/25/2014 1538   AST 19 09/12/2014 0857   AST 19 08/25/2014 1538   ALT 30 09/12/2014 0857   ALT 28 08/25/2014 1538   BILITOT 0.41 09/12/2014 0857   BILITOT 0.5  08/25/2014 1538       RADIOGRAPHIC STUDIES: I reviewed the CT scan with the patient I have personally reviewed the radiological images as listed and agreed with the findings in the report.  ASSESSMENT & PLAN:  Chronic neutropenia The patient responded well to G-CSF. Infection has resolved. There is no role for IVIG. Unfortunately, she was found to have recent relapse of possible endometrial cancer. Surgery with or without radiation or chemotherapy would be indicated. I recommend she treat returns on a weekly basis for G-CSF injection to keep her [place] greater than 1500.   History of ovarian cancer She is noted to have recurrent mass in the gynecology call area, suspicious for recurrence of cancer. Surgery/biopsy would be indicated. The gynecologist oncologists have arrange for colonoscopy due to proximity to the rectum. I will to her for further management and plan to see her back in a month to discuss whether adjuvant systemic treatment is indicated.   Rectal mass She has some mild discomfort but denies rectal pain. I agree with colonoscopy and this has been arranged through the gynecologist oncologist.    Orders Placed This Encounter  Procedures  . CBC with Differential/Platelet    Standing Status: Standing     Number of Occurrences: 22     Standing Expiration Date: 09/21/2015   All questions were answered. The patient knows to call the clinic with any problems, questions or concerns. No barriers to learning was detected. I spent 25 minutes counseling the patient face to face. The total time spent in the appointment was 30 minutes and more than 50% was on counseling and review of test results     [facility], [place], MD 09/21/2014 7:10 AM

## 2014-09-21 NOTE — Assessment & Plan Note (Signed)
She is noted to have recurrent mass in the gynecology call area, suspicious for recurrence of cancer. Surgery/biopsy would be indicated. The gynecologist oncologists have arrange for colonoscopy due to proximity to the rectum. I will to her for further management and plan to see her back in a month to discuss whether adjuvant systemic treatment is indicated.

## 2014-09-21 NOTE — Assessment & Plan Note (Signed)
The patient responded well to G-CSF. Infection has resolved. There is no role for IVIG. Unfortunately, she was found to have recent relapse of possible endometrial cancer. Surgery with or without radiation or chemotherapy would be indicated. I recommend she treat returns on a weekly basis for G-CSF injection to keep her [place] greater than 1500.

## 2014-09-21 NOTE — Assessment & Plan Note (Signed)
She has some mild discomfort but denies rectal pain. I agree with colonoscopy and this has been arranged through the gynecologist oncologist.

## 2014-09-23 ENCOUNTER — Other Ambulatory Visit: Payer: Self-pay | Admitting: Gynecologic Oncology

## 2014-09-23 ENCOUNTER — Telehealth: Payer: Self-pay | Admitting: *Deleted

## 2014-09-23 DIAGNOSIS — K6289 Other specified diseases of anus and rectum: Secondary | ICD-10-CM

## 2014-09-23 DIAGNOSIS — Z8543 Personal history of malignant neoplasm of ovary: Secondary | ICD-10-CM

## 2014-09-23 NOTE — Telephone Encounter (Signed)
Copy of BX results faxed to Dr. Jacqlyn Larsen office. LM with answering service with request per Dr. Denman [name] for pt to be further evaluated colonoscopy, EUS and biopsy of rectal mass.  This request has also been faxed to his office

## 2014-09-24 ENCOUNTER — Encounter: Payer: Self-pay | Admitting: Gynecologic Oncology

## 2014-09-24 NOTE — Progress Notes (Signed)
Spoke with Dr. Osborn Coho office and informed them that Dr. Denman [name] is recommending rectal EUS with biopsy and antibiotics for the procedure.  Office contact [phone].

## 2014-09-25 ENCOUNTER — Other Ambulatory Visit (INDEPENDENT_AMBULATORY_CARE_PROVIDER_SITE_OTHER): Payer: Self-pay | Admitting: Surgery

## 2014-09-27 ENCOUNTER — Ambulatory Visit (HOSPITAL_BASED_OUTPATIENT_CLINIC_OR_DEPARTMENT_OTHER): Payer: BLUE CROSS/BLUE SHIELD

## 2014-09-27 ENCOUNTER — Other Ambulatory Visit (HOSPITAL_BASED_OUTPATIENT_CLINIC_OR_DEPARTMENT_OTHER): Payer: BLUE CROSS/BLUE SHIELD

## 2014-09-27 ENCOUNTER — Other Ambulatory Visit: Payer: Self-pay | Admitting: Hematology and Oncology

## 2014-09-27 ENCOUNTER — Telehealth: Payer: Self-pay | Admitting: Hematology and Oncology

## 2014-09-27 DIAGNOSIS — D709 Neutropenia, unspecified: Secondary | ICD-10-CM

## 2014-09-27 DIAGNOSIS — D708 Other neutropenia: Secondary | ICD-10-CM

## 2014-09-27 LAB — CBC WITH DIFFERENTIAL/PLATELET
BASO%: 0.9 % (ref 0.0–2.0)
Basophils Absolute: 0 10*3/uL (ref 0.0–0.1)
EOS%: 1.5 % (ref 0.0–7.0)
Eosinophils Absolute: 0.1 10*3/uL (ref 0.0–0.5)
HEMATOCRIT: 41.8 % (ref 34.8–46.6)
HEMOGLOBIN: 13.4 g/dL (ref 11.6–15.9)
LYMPH#: 1.7 10*3/uL (ref 0.9–3.3)
LYMPH%: 50.3 % — ABNORMAL HIGH (ref 14.0–49.7)
MCH: 27.7 pg (ref 25.1–34.0)
MCHC: 32.1 g/dL (ref 31.5–36.0)
MCV: 86.5 fL (ref 79.5–101.0)
MONO#: 0.6 10*3/uL (ref 0.1–0.9)
MONO%: 19.3 % — ABNORMAL HIGH (ref 0.0–14.0)
NEUT#: 0.9 10*3/uL — ABNORMAL LOW (ref 1.5–6.5)
NEUT%: 28 % — ABNORMAL LOW (ref 38.4–76.8)
Platelets: 329 10*3/uL (ref 145–400)
RBC: 4.83 10*6/uL (ref 3.70–5.45)
RDW: 15.6 % — ABNORMAL HIGH (ref 11.2–14.5)
WBC: 3.3 10*3/uL — AB (ref 3.9–10.3)
nRBC: 0 % (ref 0–0)

## 2014-09-27 MED ORDER — TBO-FILGRASTIM 480 MCG/0.8ML ~~LOC~~ SOSY
480.0000 ug | PREFILLED_SYRINGE | SUBCUTANEOUS | Status: DC
  Administered 2014-09-27: 480 ug via SUBCUTANEOUS
  Filled 2014-09-27: qty 0.8

## 2014-09-27 NOTE — Telephone Encounter (Signed)
I received a telephone call from the gastroenterologist regarding potential procedure to evaluate for possible rectal invasion of the mass found on recent CT scan. I recommend additional GRANIX injection on 2/29 and March 1 prior to her surgery.

## 2014-09-30 ENCOUNTER — Ambulatory Visit (HOSPITAL_BASED_OUTPATIENT_CLINIC_OR_DEPARTMENT_OTHER): Payer: BLUE CROSS/BLUE SHIELD

## 2014-09-30 ENCOUNTER — Telehealth: Payer: Self-pay | Admitting: *Deleted

## 2014-09-30 DIAGNOSIS — D709 Neutropenia, unspecified: Secondary | ICD-10-CM

## 2014-09-30 DIAGNOSIS — D708 Other neutropenia: Secondary | ICD-10-CM

## 2014-09-30 MED ORDER — TBO-FILGRASTIM 480 MCG/0.8ML ~~LOC~~ SOSY
480.0000 ug | PREFILLED_SYRINGE | SUBCUTANEOUS | Status: DC
  Administered 2014-09-30: 480 ug via SUBCUTANEOUS
  Filled 2014-09-30: qty 0.8

## 2014-09-30 MED ORDER — ALTEPLASE 2 MG IJ SOLR
2.0000 mg | Freq: Once | INTRAMUSCULAR | Status: DC | PRN
  Filled 2014-09-30: qty 2

## 2014-09-30 MED ORDER — HEPARIN SOD (PORK) LOCK FLUSH 100 UNIT/ML IV SOLN
500.0000 [IU] | Freq: Once | INTRAVENOUS | Status: DC | PRN
  Filled 2014-09-30: qty 5

## 2014-09-30 MED ORDER — HEPARIN SOD (PORK) LOCK FLUSH 100 UNIT/ML IV SOLN
250.0000 [IU] | Freq: Once | INTRAVENOUS | Status: DC | PRN
  Filled 2014-09-30: qty 5

## 2014-09-30 MED ORDER — SODIUM CHLORIDE 0.9 % IJ SOLN
3.0000 mL | Freq: Once | INTRAMUSCULAR | Status: DC | PRN
  Filled 2014-09-30: qty 10

## 2014-09-30 MED ORDER — SODIUM CHLORIDE 0.9 % IJ SOLN
10.0000 mL | INTRAMUSCULAR | Status: DC | PRN
  Filled 2014-09-30: qty 10

## 2014-09-30 NOTE — Telephone Encounter (Signed)
Followed up with Dr Erlinda Hong office

## 2014-10-01 ENCOUNTER — Ambulatory Visit (HOSPITAL_BASED_OUTPATIENT_CLINIC_OR_DEPARTMENT_OTHER): Payer: BLUE CROSS/BLUE SHIELD

## 2014-10-01 ENCOUNTER — Other Ambulatory Visit: Payer: Self-pay | Admitting: Gastroenterology

## 2014-10-01 DIAGNOSIS — D709 Neutropenia, unspecified: Secondary | ICD-10-CM

## 2014-10-01 MED ORDER — TBO-FILGRASTIM 480 MCG/0.8ML ~~LOC~~ SOSY
480.0000 ug | PREFILLED_SYRINGE | SUBCUTANEOUS | Status: DC
  Administered 2014-10-01: 480 ug via SUBCUTANEOUS
  Filled 2014-10-01: qty 0.8

## 2014-10-01 NOTE — Addendum Note (Signed)
Addended by: Sharline Lehane on: 10/01/2014 06:30 PM   Modules accepted: Orders  

## 2014-10-02 ENCOUNTER — Encounter (HOSPITAL_COMMUNITY)
Admission: RE | Disposition: A | Discharge status: Home / Self Care | Payer: Self-pay | Source: Ambulatory Visit | Attending: Gastroenterology

## 2014-10-02 ENCOUNTER — Encounter (HOSPITAL_COMMUNITY): Payer: Self-pay | Admitting: *Deleted

## 2014-10-02 ENCOUNTER — Ambulatory Visit (HOSPITAL_COMMUNITY)
Admission: RE | Admit: 2014-10-02 | Discharge: 2014-10-02 | Disposition: A | Discharge status: Home / Self Care | Payer: BLUE CROSS/BLUE SHIELD | Source: Ambulatory Visit | Attending: Gastroenterology | Admitting: Gastroenterology

## 2014-10-02 DIAGNOSIS — R102 Pelvic and perineal pain: Secondary | ICD-10-CM | POA: Diagnosis present

## 2014-10-02 DIAGNOSIS — C763 Malignant neoplasm of pelvis: Secondary | ICD-10-CM | POA: Diagnosis not present

## 2014-10-02 DIAGNOSIS — K6289 Other specified diseases of anus and rectum: Secondary | ICD-10-CM | POA: Diagnosis not present

## 2014-10-02 DIAGNOSIS — Z8543 Personal history of malignant neoplasm of ovary: Secondary | ICD-10-CM | POA: Insufficient documentation

## 2014-10-02 HISTORY — PX: EUS: SHX5427

## 2014-10-02 LAB — GLUCOSE, CAPILLARY: Glucose-Capillary: 120 mg/dL — ABNORMAL HIGH (ref 70–99)

## 2014-10-02 SURGERY — ULTRASOUND, LOWER GI TRACT, ENDOSCOPIC
Anesthesia: Moderate Sedation

## 2014-10-02 MED ORDER — MIDAZOLAM HCL 10 MG/2ML IJ SOLN
INTRAMUSCULAR | Status: DC | PRN
  Administered 2014-10-02: 1 mg via INTRAVENOUS
  Administered 2014-10-02: 2 mg via INTRAVENOUS
  Administered 2014-10-02: 1 mg via INTRAVENOUS
  Administered 2014-10-02: 2 mg via INTRAVENOUS

## 2014-10-02 MED ORDER — DIPHENHYDRAMINE HCL 50 MG/ML IJ SOLN
INTRAMUSCULAR | Status: AC
  Filled 2014-10-02: qty 1

## 2014-10-02 MED ORDER — FENTANYL CITRATE 0.05 MG/ML IJ SOLN
INTRAMUSCULAR | Status: DC | PRN
  Administered 2014-10-02 (×3): 25 ug via INTRAVENOUS

## 2014-10-02 MED ORDER — SODIUM CHLORIDE 0.9 % IV SOLN
INTRAVENOUS | Status: DC
  Administered 2014-10-02: 500 mL via INTRAVENOUS

## 2014-10-02 MED ORDER — LEVOFLOXACIN 500 MG PO TABS: 500.0000 mg | ORAL_TABLET | Freq: Every day | ORAL | Status: DC

## 2014-10-02 MED ORDER — CIPROFLOXACIN IN D5W 400 MG/200ML IV SOLN
INTRAVENOUS | Status: AC
  Filled 2014-10-02: qty 200

## 2014-10-02 MED ORDER — MIDAZOLAM HCL 10 MG/2ML IJ SOLN
INTRAMUSCULAR | Status: AC
  Filled 2014-10-02: qty 2

## 2014-10-02 MED ORDER — FENTANYL CITRATE 0.05 MG/ML IJ SOLN
INTRAMUSCULAR | Status: AC
  Filled 2014-10-02: qty 2

## 2014-10-02 MED ORDER — CIPROFLOXACIN IN D5W 400 MG/200ML IV SOLN
400.0000 mg | Freq: Once | INTRAVENOUS | Status: AC
  Administered 2014-10-02: 400 mg via INTRAVENOUS

## 2014-10-02 MED ORDER — TRAMADOL HCL 50 MG PO TABS: 50.0000 mg | ORAL_TABLET | Freq: Four times a day (QID) | ORAL | Status: DC | PRN

## 2014-10-02 MED ORDER — CIPROFLOXACIN IN D5W 400 MG/200ML IV SOLN: 400.0000 mg | Freq: Two times a day (BID) | INTRAVENOUS | Status: DC

## 2014-10-02 NOTE — Op Note (Signed)
[facility] [place], [address]   OPERATIVE PROCEDURE REPORT  PATIENT: [name], [name]  MR#: [id number] BIRTHDATE: [birth year]  GENDER: female ENDOSCOPIST: Arta Silence, MD REFERRED BY:  Ronald Lobo, M.D. PROCEDURE DATE:  10/02/2014 PROCEDURE:   flexible sigmoidoscopy with EUS and fine needle aspiration ASA CLASS:   Class III INDICATIONS:1.  pelvic pain, pelvic lesion, history ovarian cancer. MEDICATIONS: Fentanyl 75 mcg IV and Versed 6 mg IV, ciprofloxacin 400 mg IV (Patient had had several doses of granulocyte stimulation factor injections in the week preceding her procedure, and had levofloxacin for each of two days preceding her procedure)  DESCRIPTION OF PROCEDURE:   After the risks benefits and alternatives of the procedure were thoroughly explained, informed consent was obtained.  Throughout the procedure, the patients blood pressure, pulse and oxygen saturations were monitored continuously. Under direct visualization, the radial echoendoscope followed sequentially by the linear EUS [id number]  endoscope was introduced through the anus  and advanced to the sigmoid colon .  Water was used as necessary to provide an acoustic interface.  Imaging was obtained at 7.5 and 12Mhz. Upon completion of the imaging, water was removed and the patient was sent to the recovery room in satisfactory condition.    FINDINGS:   Digital rectal exam showed mild ballotable fullness along distal right rectal wall.  Endoscopically, there was no intraluminal mass within the rectum.  However, a 22 x 22 mm hypoechoic lesion with some anechoic (possibly necrotic) spaces was seen which was extrinsically compressing the rectum.  Lesion was biopsied x 3 (2 for slide, 1 in toto for cell block) with 25g FNA needle.  There were no immediate complications.  STAGING: N/A  ENDOSCOPIC IMPRESSION: Pelvic mass with extrinsic compression of the rectum, transmural (FNA)  biopsies were done.  RECOMMENDATIONS: 1.  Watch for potential complications of procedure, especially infection. 2.  Await pathology results. 3.  Levofloxacin qd x 2 more days (Thursday and Friday). 4.  Will call patient once biopsy results are back.   _______________________________ eSigned:  Arta Silence, MD 10/02/2014 12:59 PM   CC:

## 2014-10-02 NOTE — H&P (Signed)
Patient interval history reviewed.  Patient examined again.  There has been no change from documented H/P dated 09/27/14 (scanned into chart from our office) except as documented above.  Assessment:  1.  Pelvic mass.  Intraluminal colonic mass versus recurrent ovarian cancer versus other.  Plan:  1.  Endorectal ultrasound with possible mucosal or deep (fine needle aspiration) biopsies. 2.  Risks (bleeding, infection especially, bowel perforation that could require surgery, sedation-related changes in cardiopulmonary systems), benefits (identification and possible treatment of source of symptoms, exclusion of certain causes of symptoms), and alternatives (watchful waiting, radiographic imaging studies, empiric medical treatment) of endorectal ultrasound with possible biopsies (RUS +/- FNA) were explained to patient/family in detail and patient wishes to proceed.

## 2014-10-02 NOTE — Discharge Instructions (Signed)
Endorectal ultrasound ° °Post procedure instructions: ° °Read the instructions outlined below and refer to this sheet in the next few weeks. These discharge instructions provide you with general information on caring for yourself after you leave the hospital. Your doctor may also give you specific instructions. While your treatment has been planned according to the most current medical practices available, unavoidable complications occasionally occur. If you have any problems or questions after discharge, call Dr. Lycia Sachdeva at [facility] ([phone]). ° °HOME CARE INSTRUCTIONS ° °ACTIVITY: °· You may resume your regular activity, but move at a slower pace for the next 24 hours.  °· Take frequent rest periods for the next 24 hours.  °· Walking will help get rid of the air and reduce the bloated feeling in your belly (abdomen).  °· No driving for 24 hours (because of the medicine (anesthesia) used during the test).  °· You may shower.  °· Do not sign any important legal documents or operate any machinery for 24 hours (because of the anesthesia used during the test).  °NUTRITION: °· Drink plenty of fluids.  °· You may resume your normal diet as instructed by your doctor.  °· Begin with a light meal and progress to your normal diet. Heavy or fried foods are harder to digest and may make you feel sick to your stomach (nauseated).  °· Avoid alcoholic beverages for 24 hours or as instructed.  °MEDICATIONS: °· You may resume your normal medications unless your doctor tells you otherwise.  °WHAT TO EXPECT TODAY: °· Some feelings of bloating in the abdomen.  °· Passage of more gas than usual.  °· Spotting of blood in your stool or on the toilet paper.  °IF YOU HAD POLYPS REMOVED DURING THE COLONOSCOPY: °· No aspirin products for 7 days or as instructed.  °· No alcohol for 7 days or as instructed.  °· Eat a soft diet for the next 24 hours.  ° °FINDING OUT THE RESULTS OF YOUR TEST ° °Not all test results are available  during your visit. If your test results are not back during the visit, make an appointment with your caregiver to find out the results. Do not assume everything is normal if you have not heard from your caregiver or the medical facility. It is important for you to follow up on all of your test results.  ° ° ° °SEEK IMMEDIATE MEDICAL CARE IF: ° °· You have more than a spotting of blood in your stool.  °· Your belly is swollen (abdominal distention).  °· You are nauseated or vomiting.  °· You have a fever.  °· You have abdominal pain or discomfort that is severe or gets worse throughout the day.  ° ° °Document Released: 03/02/2004 Document Revised: 03/31/2011 Document Reviewed: 02/29/2008 °ExitCare® Patient Information ©2012 [employer]. ° °

## 2014-10-03 ENCOUNTER — Encounter (HOSPITAL_COMMUNITY): Payer: Self-pay | Admitting: Gastroenterology

## 2014-10-04 ENCOUNTER — Telehealth: Payer: Self-pay | Admitting: *Deleted

## 2014-10-04 ENCOUNTER — Encounter (HOSPITAL_COMMUNITY): Payer: Self-pay

## 2014-10-04 ENCOUNTER — Other Ambulatory Visit: Payer: Self-pay | Admitting: Gynecologic Oncology

## 2014-10-04 ENCOUNTER — Other Ambulatory Visit (HOSPITAL_BASED_OUTPATIENT_CLINIC_OR_DEPARTMENT_OTHER): Payer: BLUE CROSS/BLUE SHIELD

## 2014-10-04 ENCOUNTER — Ambulatory Visit: Payer: Self-pay | Admitting: Gynecologic Oncology

## 2014-10-04 ENCOUNTER — Ambulatory Visit: Payer: BLUE CROSS/BLUE SHIELD

## 2014-10-04 DIAGNOSIS — C541 Malignant neoplasm of endometrium: Secondary | ICD-10-CM

## 2014-10-04 DIAGNOSIS — D709 Neutropenia, unspecified: Secondary | ICD-10-CM

## 2014-10-04 DIAGNOSIS — D708 Other neutropenia: Secondary | ICD-10-CM

## 2014-10-04 LAB — CBC WITH DIFFERENTIAL/PLATELET
BASO%: 0.4 % (ref 0.0–2.0)
BASOS ABS: 0.1 10*3/uL (ref 0.0–0.1)
EOS ABS: 0.2 10*3/uL (ref 0.0–0.5)
EOS%: 1.2 % (ref 0.0–7.0)
HCT: 42.3 % (ref 34.8–46.6)
HGB: 13.4 g/dL (ref 11.6–15.9)
LYMPH#: 2.3 10*3/uL (ref 0.9–3.3)
LYMPH%: 17.7 % (ref 14.0–49.7)
MCH: 27.6 pg (ref 25.1–34.0)
MCHC: 31.7 g/dL (ref 31.5–36.0)
MCV: 87.2 fL (ref 79.5–101.0)
MONO#: 0.9 10*3/uL (ref 0.1–0.9)
MONO%: 6.8 % (ref 0.0–14.0)
NEUT#: 9.5 10*3/uL — ABNORMAL HIGH (ref 1.5–6.5)
NEUT%: 73.9 % (ref 38.4–76.8)
Platelets: 292 10*3/uL (ref 145–400)
RBC: 4.85 10*6/uL (ref 3.70–5.45)
RDW: 16 % — AB (ref 11.2–14.5)
WBC: 12.9 10*3/uL — AB (ref 3.9–10.3)

## 2014-10-04 MED ORDER — TBO-FILGRASTIM 480 MCG/0.8ML ~~LOC~~ SOSY: 480.0000 ug | PREFILLED_SYRINGE | SUBCUTANEOUS | Status: DC

## 2014-10-04 NOTE — Telephone Encounter (Signed)
Spoke with patient by phone this morning - she is inquiring about Dr. Serita Grit plan now. She states Dr. Paulita Fujita called her yesterday and confirmed that the biopsies showed cancer. Told patient that I will pass this along to Dr. Denman [name] and that someone from our office will call her back this afternoon with an update.  Dr. Denman [name] notified and she called and discussed plan directly with patient by phone.

## 2014-10-07 ENCOUNTER — Telehealth: Payer: Self-pay | Admitting: *Deleted

## 2014-10-07 NOTE — Telephone Encounter (Signed)
Pls call PET department to clarify. Thanks

## 2014-10-07 NOTE — Telephone Encounter (Signed)
PT. IS FOR A PET ON 10/10/14 AND A MRI ON 10/14/14. SHE TALKED WITH RADIOLOGY CONCERNING HER DIABETES. SHE WAS TOLD NOT TO TAKE INSULIN WHICH PT IS NOT ON INSULIN BUT THE PERSON APPEARED UNCERTAIN ABOUT HER PO DIABETIC MEDICATIONS. PT. TAKES INVOKANA, JANUVIA, AND GLUCOPHAGE. SHE WOULD LIKE DR.GORSUCH'S INSTRUCTIONS ON TAKING THESE MEDICATIONS BEFORE HER RADIOLOGY TESTS.

## 2014-10-08 NOTE — Telephone Encounter (Signed)
SPOKE TO [place]. PT. MAY TAKE ALL OF HER PO DIABETIC MEDICATIONS WITH A SIP OF WATER BEFORE THE PET SCAN. HER BLOOD SUGAR WILL BE CHECKED BEFORE THE PET SCAN. PT. ALSO CAN TAKE HER PO DIABETIC MEDICATIONS WITH WATER BEFORE HER MRI ON 10/14/14. CALLED PT. WITH THE ABOVE INFORMATION. ALSO GAVE PT. THE PHONE NUMBER TO THE PET DEPARTMENT IF ANY OTHER QUESTIONS ARISE. SHE VOICES UNDERSTANDING.

## 2014-10-09 ENCOUNTER — Telehealth: Payer: Self-pay | Admitting: *Deleted

## 2014-10-09 NOTE — Telephone Encounter (Signed)
Called Kim at [facility] who does surgery scheduling for Dr. Johney Maine in regards to a joint case on patient. Per Maudie Mercury, she is not aware of Dr. Johney Maine doing a joint case with Dr. Denman [name]. She states she will have to followup with him and call us back.

## 2014-10-10 ENCOUNTER — Ambulatory Visit (HOSPITAL_COMMUNITY)
Admission: RE | Admit: 2014-10-10 | Discharge: 2014-10-10 | Disposition: A | Discharge status: Home / Self Care | Payer: BLUE CROSS/BLUE SHIELD | Source: Ambulatory Visit | Attending: Gynecologic Oncology | Admitting: Gynecologic Oncology

## 2014-10-10 ENCOUNTER — Other Ambulatory Visit (INDEPENDENT_AMBULATORY_CARE_PROVIDER_SITE_OTHER): Payer: Self-pay | Admitting: Surgery

## 2014-10-10 DIAGNOSIS — C541 Malignant neoplasm of endometrium: Secondary | ICD-10-CM | POA: Insufficient documentation

## 2014-10-10 LAB — GLUCOSE, CAPILLARY: Glucose-Capillary: 139 mg/dL — ABNORMAL HIGH (ref 70–99)

## 2014-10-10 MED ORDER — FLUDEOXYGLUCOSE F - 18 (FDG) INJECTION
12.1000 | Freq: Once | INTRAVENOUS | Status: AC | PRN
  Administered 2014-10-10: 12.1 via INTRAVENOUS

## 2014-10-10 NOTE — H&P (Addendum)
[name] 09/25/2014 12:15 PM Location: [facility] Patient #: [id number] DOB: 04/08/1956 Married / Language: [name] / Race: White Female  History of Present Illness [name]; 09/25/2014 1:16 PM) Patient words: rectal mass.  The patient is a 59 year old female who presents with an abdominal mass. Patient sent by gynecological oncologist Dr. 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 History of endometrioid cancer of the ovary. Underwent hysterectomy and salpingo-oophorectomy and possible intraperitoneal chemotherapy in 10/28/2004 by Dr. Rhodia Albright at [facility] He cannot pull up records through care everywhere. Trying to obtain operative reports. Had no evidence of recurrent disease. Has had episodes of rectal spasming and pain and occasional vaginal bleeding. This started in the fall. Progress. CT scan concerning for pelvic mass between the vagina and the rectum. Seen by gynecology oncology in town since she lives in [place]. Mass affect rectovaginal septum felt protruding into vaginal cuff and protruding into rectum. Biopsy done in office last week. Pathology consistent with squamous mucosa inflammation. No evidence of dysplasia or malignancy. I was called because concerned by her gynecological oncologist that this may require surgery to treat. Patient normally has a bowel movement every day but has had some intermittent loose stools. Can get some crampy abdominal pain. Can be painful and have bowel movements pressure. No problems with urination. Normally can walk a half hour without much difficulty but lately has had issues with discomfort. No exertional chest pain or shortness of breath. She's not had any prior abdominal surgery. History colon polyps with screening colonoscopies done in the past. Chronic idiopathic neutropenia with some intermittent hormonal therapy done.   Other  Problems Mammie Lorenzo, LPN; 8/65/7846 96:29 PM) Back Pain Cancer Cholelithiasis Diabetes Mellitus Gastroesophageal Reflux Disease Hemorrhoids High blood pressure Oophorectomy Bilateral. Other disease, cancer, significant illness Ovarian Cancer Thyroid Disease  Past Surgical History Mammie Lorenzo, LPN; 12/28/4130 44:01 PM) Appendectomy Colon Polyp Removal - Colonoscopy Gallbladder Surgery - Laparoscopic Hysterectomy (due to cancer) - Complete  Diagnostic Studies History Mammie Lorenzo, LPN; 0/27/2536 64:40 PM) Colonoscopy within last year Mammogram within last year Pap Smear >5 years ago  Allergies Mammie Lorenzo, LPN; 3/47/4259 56:38 PM) Cipro *FLUOROQUINOLONES* Penicillins Swelling. Sulfa Antibiotics Rash. Trimethoprim Erythromycin *DERMATOLOGICALS* Adhesive Tape 1"x5yd *MEDICAL DEVICES AND SUPPLIES* Rash.  Medication History Mammie Lorenzo, LPN; 7/56/4332 95:18 PM) Hydrocod Polst-CPM Polst ER (10-8MG/5ML Liquid ER, Oral) Active. Benzonatate (100MG Capsule, Oral) Active. Crestor (10MG Tablet, Oral) Active. Hydrochlorothiazide (12.5MG Tablet, Oral) Active. Invokana (300MG Tablet, Oral) Active. Januvia (100MG Tablet, Oral) Active. MetFORMIN HCl (1000MG Tablet, Oral) Active. Synthroid (175MCG Tablet, Oral) Active. Valsartan (320MG Tablet, Oral) Active. Lovaza (1GM Capsule, Oral) Active. Colace (100MG Capsule, Oral) Active. Medications Reconciled  Social History Mammie Lorenzo, LPN; 8/41/6606 30:16 PM) Alcohol use Occasional alcohol use. Caffeine use Carbonated beverages, Coffee, Tea. No drug use Tobacco use Never smoker.  Family History Mammie Lorenzo, LPN; 0/05/9322 55:73 PM) Cancer Mother. Diabetes Mellitus Brother, Father. Heart Disease Brother, Father. Hypertension Brother, Father. Prostate Cancer Father.  Pregnancy / Birth History Mammie Lorenzo, LPN; 09/21/2540 70:62 PM) Age at menarche 50 years. Age of  menopause 67-50 Gravida 1 Maternal age 74-25 Para 1  Review of Systems Mammie Lorenzo LPN; 3/76/2831 51:76 PM) General Present- Appetite Loss and Fatigue. Not Present- Chills, Fever, Night Sweats, Weight Gain and Weight Loss. Skin Not Present- Change in Wart/Mole, Dryness, Hives, Jaundice, New Lesions, Non-Healing Wounds, Rash and Ulcer. HEENT Present- Ringing in the Ears and Wears  glasses/contact lenses. Not Present- Earache, Hearing Loss, Hoarseness, Nose Bleed, Oral Ulcers, Seasonal Allergies, Sinus Pain, Sore Throat, Visual Disturbances and Yellow Eyes. Respiratory Not Present- Bloody sputum, Chronic Cough, Difficulty Breathing, Snoring and Wheezing. Breast Not Present- Breast Mass, Breast Pain, Nipple Discharge and Skin Changes. Cardiovascular Present- Palpitations and Swelling of Extremities. Not Present- Chest Pain, Difficulty Breathing Lying Down, Leg Cramps, Rapid Heart Rate and Shortness of Breath. Gastrointestinal Present- Abdominal Pain, Bloating, Change in Bowel Habits, Gets full quickly at meals, Hemorrhoids and Rectal Pain. Not Present- Bloody Stool, Chronic diarrhea, Constipation, Difficulty Swallowing, Excessive gas, Indigestion, Nausea and Vomiting. Female Genitourinary Present- Pelvic Pain and Urgency. Not Present- Frequency, Nocturia and Painful Urination. Musculoskeletal Present- Back Pain. Not Present- Joint Pain, Joint Stiffness, Muscle Pain, Muscle Weakness and Swelling of Extremities. Neurological Not Present- Decreased Memory, Fainting, Headaches, Numbness, Seizures, Tingling, Tremor, Trouble walking and Weakness. Psychiatric Not Present- Anxiety, Bipolar, Change in Sleep Pattern, Depression, Fearful and Frequent crying. Endocrine Present- Hair Changes. Not Present- Cold Intolerance, Excessive Hunger, Heat Intolerance, Hot flashes and New Diabetes. Hematology Present- Easy Bruising. Not Present- Excessive bleeding, Gland problems, HIV and Persistent  Infections.   Vitals Claiborne Billings Dockery LPN; 11/11/8784 76:72 PM) 09/25/2014 12:21 PM Weight: 250 lb Height: 62in Body Surface Area: 2.23 m Body Mass Index: 45.73 kg/m Temp.: 98.78F  Pulse: 91 (Regular)  Resp.: 20 (Unlabored)  BP: 122/78 (Sitting, Left Arm, Standard)    Physical Exam [name]; 09/25/2014 12:38 PM) General Mental Status-Alert. General Appearance-Not in acute distress, Not Sickly. Orientation-Oriented X3. Hydration-Well hydrated. Voice-Normal.  Integumentary Global Assessment Upon inspection and palpation of skin surfaces of the - Axillae: non-tender, no inflammation or ulceration, no drainage. and Distribution of scalp and body hair is normal. General Characteristics Temperature - normal warmth is noted.  Head and Neck Head-normocephalic, atraumatic with no lesions or palpable masses. Face Global Assessment - atraumatic, no absence of expression. Neck Global Assessment - no abnormal movements, no bruit auscultated on the right, no bruit auscultated on the left, no decreased range of motion, non-tender. Trachea-midline. Thyroid Gland Characteristics - non-tender.  Eye Eyeball - Left-Extraocular movements intact, No Nystagmus. Eyeball - Right-Extraocular movements intact, No Nystagmus. Cornea - Left-No Hazy. Cornea - Right-No Hazy. Sclera/Conjunctiva - Left-No scleral icterus, No Discharge. Sclera/Conjunctiva - Right-No scleral icterus, No Discharge. Pupil - Left-Direct reaction to light normal. Pupil - Right-Direct reaction to light normal. Note: Moderate RIGHT upper eyelid droop. Extraocular movements intact and symmetrical.   ENMT Ears Pinna - Left - no drainage observed, no generalized tenderness observed. Right - no drainage observed, no generalized tenderness observed. Nose and Sinuses External Inspection of the Nose - no destructive lesion observed. Inspection of the nares - Left - quiet  respiration. Right - quiet respiration. Mouth and Throat Lips - Upper Lip - no fissures observed, no pallor noted. Lower Lip - no fissures observed, no pallor noted. Nasopharynx - no discharge present. Oral Cavity/Oropharynx - Tongue - no dryness observed. Oral Mucosa - no cyanosis observed. Hypopharynx - no evidence of airway distress observed.  Chest and Lung Exam Inspection Movements - Normal and Symmetrical. Accessory muscles - No use of accessory muscles in breathing. Palpation Palpation of the chest reveals - Non-tender. Auscultation Breath sounds - Normal and Clear.  Cardiovascular Auscultation Rhythm - Regular. Murmurs & Other Heart Sounds - Auscultation of the heart reveals - No Murmurs and No Systolic Clicks.  Abdomen Inspection Inspection of the abdomen reveals - No Visible peristalsis and No Abnormal pulsations. Umbilicus -  No Bleeding, No Urine drainage. Palpation/Percussion Palpation and Percussion of the abdomen reveal - Soft, Non Tender, No Rebound tenderness, No Rigidity (guarding) and No Cutaneous hyperesthesia. Note: Obese but soft. No diastases. No umbilical hernia. Mild tenderness to palpation in the RIGHT suprapubic region. No inguinal hernia. No guarding. Mild panniculus. Good hygiene.   Female Genitourinary Sexual Maturity Tanner 5 - Adult hair pattern. Note: No vaginal bleeding nor discharge. At apex of the vaginal cuff on the RIGHT side 1 cm tender firm and bulging. Fixed to her rectovaginal septum. No definite ulcerations   Rectal Note: No pruritus. No pilonidal disease. Small RIGHT anterior and LEFT lateral external hemorrhoid skin tags. Normal sphincter tone. Rectum soft. Firm mass pushing into RIGHT anterior rectal wall. No ulcerations seen or felt. Mucosa mobile over this. Fixed. Tender/sensitive. 6 cm from anal verge. Involving about a quarter of the circumference. Anoscopy done. No ulceration seen.   Peripheral Vascular Upper  Extremity Inspection - Left - No Cyanotic nailbeds, Not Ischemic. Right - No Cyanotic nailbeds, Not Ischemic.  Neurologic Neurologic evaluation reveals -normal attention span and ability to concentrate, able to name objects and repeat phrases. Appropriate fund of knowledge , normal sensation and normal coordination. Mental Status Affect - not angry, not paranoid. Cranial Nerves-Normal Bilaterally. Gait-Normal.  Neuropsychiatric Mental status exam performed with findings of-able to articulate well with normal speech/language, rate, volume and coherence, thought content normal with ability to perform basic computations and apply abstract reasoning and no evidence of hallucinations, delusions, obsessions or homicidal/suicidal ideation.  Musculoskeletal Global Assessment Spine, Ribs and Pelvis - no instability, subluxation or laxity. Right Upper Extremity - no instability, subluxation or laxity.  Lymphatic Head & Neck  General Head & Neck Lymphatics: Bilateral - Description - No Localized lymphadenopathy. Axillary  General Axillary Region: Bilateral - Description - No Localized lymphadenopathy. Femoral & Inguinal  Generalized Femoral & Inguinal Lymphatics: Left - Description - No Localized lymphadenopathy. Right - Description - No Localized lymphadenopathy.    Results [name]; 09/25/2014 1:18 PM) Procedures  Name Value Date Hemorrhoids Procedure Anal exam: External Hemorrhoid Skin tag Internal exam: Mass Other: Bulky fixed rectovaginal septal mass pushing into RIGHT anterior rectum. Distal and about 6 cm from anal verge. Please see rectal exam.  Performed: 09/25/2014 12:38 PM   Assessment & Plan [name]; 09/25/2014 1:18 PM) PELVIC MASS IN FEMALE (789.30  R19.00) Impression: Firm fixed pelvic mass in proximal/mid rectovaginal septum adherent to the cuff of vagina and pushing into anterior rectal wall. CT scan exam suspicious for  cancer recurrence. Doubt rectal adenocarcinoma etiology.  It is reasonable to get endoscopic ultrasound rectally for biopsy of this mass since vaginal biops. See if Dr. Paulita Fujita can do that for [facility]. Other possibility would be CT-guided biopsy but endorectal ultrasound will be helpful to get a sense of invasion into the rectum or not.  If it is cancer recurrence, defer to Dr. Denman [name] on management ultimately. She is understandably concerned given the size that radiation therapy by external beam and brachytherapy may be inadequate for this. Sounds like the patient was told she had a drop met there before on the oringinal surgery.  Dr. Serita Grit concern that resection of this mass may combine partial vaginectomy with combined low anterior resection. I suspect she may be right. It is about 6 cm from the anal verge. Anastomosis certainly probable and not need for permanent colostomy. However would need to temporary diverting loop ileostomy to protect the low colorectal  anastomosis. I'll defer to Dr. Denman [name] and further workup to figure out plan of need of neoadjuvant chemoradiation therapy versus primary resection first.  Usually neoadjuvant chemoradiation therapy is helpful to help shrink the tumor and make resection more easy. This works well for colon adenocarcinoma. We'll have to defer to her on if useful in recurrent ovarian/endometrial cancer. Most likely would discuss at GI tumor Board. She may wish to discuss at [facility] tumor board first.  If he comes to concomittent low anterior resection, would like to try and do a minimally invasive robotic approach and minimize the need for a larger open incision. May complete the final resection in an open fashion per Dr. Denman [name] depending what she feels. Combined case. Dr. Denman [name] did caution this may go back to [facility] for combined pelvic surgery. Current Plans  ANOSCOPY, DIAGNOSTIC [phone]) Instructed to make follow-up appointment for office visit following completion of  diagnostic tests The anatomy & physiology of the digestive tract was discussed. The pathophysiology of the rectal pathology was discussed. Natural history risks without surgery was discussed. I worked to give an overview of the disease and the frequent need to have multispecialty involvement. I feel the risks of no intervention will lead to serious problems that outweigh the operative risks; therefore, I recommended a partial proctocolectomy to remove the pathology. Minimally Invasive (Robotic/Laparoscopic) & open techniques were discussed. We will work to preserve anal & pelvic floor function without sacrificing cure.  Risks such as bleeding, infection, abscess, leak, reoperation, possible temporary or permanent ostomy, hernia, heart attack, death, and other risks were discussed. I noted a good likelihood this will help address the problem. Goals of post-operative recovery were discussed as well. We will work to minimize complications. Educational information was available as well. Questions were answered. The patient expresses understanding & wishes to proceed with surgery. Pt Education - [facility] Good Bowel Health (Laycee Fitzsimmons) Pt Education - [facility] Ostomy [facility]CI (Matisha Termine) Pt Education - [facility] Abdominal Surgery (Mafalda Mcginniss)   Signed by [name], MD (09/25/2014 1:21 PM)  PET scan shows no other metastatic disease.  Discussed with Dr. Denman [name].  Plan combined partial vaginectomy and low anterior resection to remove recurrent mass.  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  Do surgery first.  Perhaps post-adjuvant radiation.  No neoadjuvant radiation or chemotherapy at this time.  Reasonable to start robotically.  May be able to extract out transvaginally with wound protector. May need extraction through midline or Pfannenstiel abdominal incision.  May need to be done open.  [name], M.D., F.A.C.S. Gastrointestinal and Minimally Invasive  Surgery [facility] [address], [place], [place] [address] [phone] Main / Paging  '

## 2014-10-11 ENCOUNTER — Telehealth: Payer: Self-pay | Admitting: *Deleted

## 2014-10-11 ENCOUNTER — Ambulatory Visit (HOSPITAL_BASED_OUTPATIENT_CLINIC_OR_DEPARTMENT_OTHER): Payer: BLUE CROSS/BLUE SHIELD

## 2014-10-11 ENCOUNTER — Telehealth: Payer: Self-pay | Admitting: Gynecologic Oncology

## 2014-10-11 ENCOUNTER — Other Ambulatory Visit (HOSPITAL_BASED_OUTPATIENT_CLINIC_OR_DEPARTMENT_OTHER): Payer: BLUE CROSS/BLUE SHIELD

## 2014-10-11 DIAGNOSIS — D709 Neutropenia, unspecified: Secondary | ICD-10-CM

## 2014-10-11 DIAGNOSIS — R1084 Generalized abdominal pain: Secondary | ICD-10-CM

## 2014-10-11 LAB — CBC WITH DIFFERENTIAL/PLATELET
BASO%: 2.3 % — ABNORMAL HIGH (ref 0.0–2.0)
Basophils Absolute: 0.1 10*3/uL (ref 0.0–0.1)
EOS%: 1.4 % (ref 0.0–7.0)
Eosinophils Absolute: 0 10*3/uL (ref 0.0–0.5)
HCT: 40.9 % (ref 34.8–46.6)
HEMOGLOBIN: 12.9 g/dL (ref 11.6–15.9)
LYMPH#: 1.7 10*3/uL (ref 0.9–3.3)
LYMPH%: 48.2 % (ref 14.0–49.7)
MCH: 27 pg (ref 25.1–34.0)
MCHC: 31.6 g/dL (ref 31.5–36.0)
MCV: 85.5 fL (ref 79.5–101.0)
MONO#: 0.4 10*3/uL (ref 0.1–0.9)
MONO%: 12.9 % (ref 0.0–14.0)
NEUT#: 1.2 10*3/uL — ABNORMAL LOW (ref 1.5–6.5)
NEUT%: 35.2 % — ABNORMAL LOW (ref 38.4–76.8)
Platelets: 331 10*3/uL (ref 145–400)
RBC: 4.78 10*6/uL (ref 3.70–5.45)
RDW: 17.2 % — ABNORMAL HIGH (ref 11.2–14.5)
WBC: 3.4 10*3/uL — AB (ref 3.9–10.3)

## 2014-10-11 MED ORDER — OXYCODONE-ACETAMINOPHEN 5-325 MG PO TABS: 1.0000 | ORAL_TABLET | ORAL | Status: DC | PRN

## 2014-10-11 MED ORDER — TBO-FILGRASTIM 480 MCG/0.8ML ~~LOC~~ SOSY
480.0000 ug | PREFILLED_SYRINGE | SUBCUTANEOUS | Status: DC
  Administered 2014-10-11: 480 ug via SUBCUTANEOUS
  Filled 2014-10-11: qty 0.8

## 2014-10-11 NOTE — Telephone Encounter (Addendum)
Patient informed that PET showed no evidence for metastatic disease and that we are moving forwards with plan for a robotic upper vaginectomy, low anterior resection with Dr. Johney Maine on April 19.  Requesting pain medication since tramadol did not offer any relief.  Has taken percocet in the past with no issues.  Reporting significant pain when she becomes mildly constipated.  Bowel regimen discussed.  Advised to call for any questions or concerns.

## 2014-10-11 NOTE — Telephone Encounter (Signed)
Patient requests to see Dr. Denman [name] prior to her surgery on 11/19/14. Pt scheduled to see Dr. Denman [name] 11/11/14 at 10:30am - pt agreeable to appt date and time.

## 2014-10-14 ENCOUNTER — Ambulatory Visit (HOSPITAL_COMMUNITY)
Admission: RE | Admit: 2014-10-14 | Discharge: 2014-10-14 | Disposition: A | Discharge status: Home / Self Care | Payer: BLUE CROSS/BLUE SHIELD | Source: Ambulatory Visit | Attending: Gynecologic Oncology | Admitting: Gynecologic Oncology

## 2014-10-14 DIAGNOSIS — R938 Abnormal findings on diagnostic imaging of other specified body structures: Secondary | ICD-10-CM | POA: Insufficient documentation

## 2014-10-14 DIAGNOSIS — Z90710 Acquired absence of both cervix and uterus: Secondary | ICD-10-CM | POA: Diagnosis not present

## 2014-10-14 DIAGNOSIS — C541 Malignant neoplasm of endometrium: Secondary | ICD-10-CM | POA: Insufficient documentation

## 2014-10-14 MED ORDER — GADOBENATE DIMEGLUMINE 529 MG/ML IV SOLN
20.0000 mL | Freq: Once | INTRAVENOUS | Status: AC | PRN
  Administered 2014-10-14: 20 mL via INTRAVENOUS

## 2014-10-17 ENCOUNTER — Other Ambulatory Visit: Payer: Self-pay | Admitting: *Deleted

## 2014-10-18 ENCOUNTER — Other Ambulatory Visit (HOSPITAL_BASED_OUTPATIENT_CLINIC_OR_DEPARTMENT_OTHER): Payer: BLUE CROSS/BLUE SHIELD

## 2014-10-18 ENCOUNTER — Ambulatory Visit (HOSPITAL_BASED_OUTPATIENT_CLINIC_OR_DEPARTMENT_OTHER): Payer: BLUE CROSS/BLUE SHIELD | Admitting: Hematology and Oncology

## 2014-10-18 ENCOUNTER — Encounter: Payer: Self-pay | Admitting: Hematology and Oncology

## 2014-10-18 ENCOUNTER — Ambulatory Visit (HOSPITAL_BASED_OUTPATIENT_CLINIC_OR_DEPARTMENT_OTHER): Payer: BLUE CROSS/BLUE SHIELD

## 2014-10-18 ENCOUNTER — Telehealth: Payer: Self-pay | Admitting: Hematology and Oncology

## 2014-10-18 VITALS — BP 130/50 | HR 69 | Temp 97.8°F | Resp 18 | Ht 62.0 in | Wt 250.4 lb

## 2014-10-18 DIAGNOSIS — D709 Neutropenia, unspecified: Secondary | ICD-10-CM

## 2014-10-18 DIAGNOSIS — Z8543 Personal history of malignant neoplasm of ovary: Secondary | ICD-10-CM

## 2014-10-18 DIAGNOSIS — D708 Other neutropenia: Secondary | ICD-10-CM

## 2014-10-18 LAB — CBC WITH DIFFERENTIAL/PLATELET
BASO%: 3.3 % — ABNORMAL HIGH (ref 0.0–2.0)
BASOS ABS: 0.1 10*3/uL (ref 0.0–0.1)
EOS%: 3.3 % (ref 0.0–7.0)
Eosinophils Absolute: 0.1 10*3/uL (ref 0.0–0.5)
HEMATOCRIT: 41.1 % (ref 34.8–46.6)
HEMOGLOBIN: 13.1 g/dL (ref 11.6–15.9)
LYMPH#: 1.6 10*3/uL (ref 0.9–3.3)
LYMPH%: 57.8 % — ABNORMAL HIGH (ref 14.0–49.7)
MCH: 27.8 pg (ref 25.1–34.0)
MCHC: 31.9 g/dL (ref 31.5–36.0)
MCV: 87.1 fL (ref 79.5–101.0)
MONO#: 0.7 10*3/uL (ref 0.1–0.9)
MONO%: 24 % — ABNORMAL HIGH (ref 0.0–14.0)
NEUT#: 0.3 10*3/uL — CL (ref 1.5–6.5)
NEUT%: 11.6 % — ABNORMAL LOW (ref 38.4–76.8)
Platelets: 329 10*3/uL (ref 145–400)
RBC: 4.72 10*6/uL (ref 3.70–5.45)
RDW: 16.3 % — ABNORMAL HIGH (ref 11.2–14.5)
WBC: 2.8 10*3/uL — AB (ref 3.9–10.3)
nRBC: 0 % (ref 0–0)

## 2014-10-18 MED ORDER — TBO-FILGRASTIM 480 MCG/0.8ML ~~LOC~~ SOSY
480.0000 ug | PREFILLED_SYRINGE | SUBCUTANEOUS | Status: DC
  Administered 2014-10-18: 480 ug via SUBCUTANEOUS
  Filled 2014-10-18: qty 0.8

## 2014-10-18 NOTE — Assessment & Plan Note (Signed)
The patient responded well to G-CSF. Infection has resolved. There is no role for IVIG. I recommend she treat returns on a weekly basis for G-CSF injection to keep her [place] greater than 1500.

## 2014-10-18 NOTE — Assessment & Plan Note (Signed)
She has recurrent pelvic mass, suspicious for endometrial cancer. She is undergoing significant evaluation by gynecologist oncologist. I reviewed the most recent MRI dated 10/14/2014 with the patient. According to the patient, she has surgical date planned for 11/19/2014. I will continue to cover her with intermittent G-CSF injection to raise a white blood cell count prior to surgery. If not, chronic neutropenia has been associated with nonhealing wound. I plan to schedule injections on 3/25, 4/1, 4/11, 4/15 and 4/18 all the way until her surgery. If the plan is to be admitted to [facility] for the surgery, I will follow her as an inpatient. I received her back in my office on 11/29/2014 for further evaluation.

## 2014-10-18 NOTE — Progress Notes (Signed)
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 Going to the patient, initial diagnosis with ovarian cancer was when she presented with abdomen and no swelling and pelvic pain leading to her urgent care visit. She states that the tumor had caused torsion of the ovary. She cannot remember the stage of her disease. She began to develop leukopenia in approximately July of 2010. She underwent an initial bone marrow biopsy at [facility] in [place] on 03/17/2009. but it was nondiagnostic. No gross dysplastic changes, no excess blasts, normal cytogenetics.  Her counts have been stable over observation through this office since December 2010 with some minor fluctuations. Total white counts run as low as 1800 and as high as 2900. Percent neutrophils fluctuates very widely from as low as 4% recorded on a CBC done 02/03/2010 to as high as 31% with average being 20%. She was hospitalized in the ICU at that time  Both hemoglobin and platelets have remained stable over time.  Despite leukopenia, she has not had any recurrent or severe infections. She stated strong family history of lupus in a cousin. She complained of occasional skin rash and joint stiffness. 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 She received G-CSF in January of 2015 to stimulate white blood cell production around a colonoscopy procedure a few years ago. She had a limited response. On  01/23/2014, repeat bone marrow biopsy excluded lymphoma or leukemia. On 09/12/2014, CT scan of the abdomen and pelvis show possible relapse of gynecological Malignancy On 10/14/2014, MRI of the pelvis show significant pelvic mass without invasion into the bladder but abutting to the rectum  INTERVAL HISTORY: Please see below for problem oriented charting. She feels well. She has mild pelvic discomfort. Denies recent infection.  REVIEW OF SYSTEMS:   Constitutional: Denies fevers, chills or abnormal weight loss Eyes: Denies blurriness of vision Ears, nose, mouth, throat, and face: Denies mucositis or sore throat Respiratory: Denies cough, dyspnea or wheezes Cardiovascular: Denies palpitation, chest discomfort or lower extremity swelling Gastrointestinal:  Denies nausea, heartburn or change in bowel habits Skin: Denies abnormal skin rashes Lymphatics: Denies new lymphadenopathy or easy bruising Neurological:Denies numbness, tingling or new weaknesses Behavioral/Psych: Mood is stable, no new changes  All other systems were reviewed with the patient and are negative.  I have reviewed the past medical history, past surgical history, social history and family history with the patient and they are unchanged from previous note.  ALLERGIES:  is allergic to penicillins; adhesive; cefaclor; erythromycin; trimethoprim; and sulfa antibiotics.  MEDICATIONS:  Current Outpatient Prescriptions  Medication Sig Dispense Refill  . albuterol (PROVENTIL HFA;VENTOLIN HFA) 108 (90 BASE) MCG/ACT inhaler Inhale 2 puffs into the lungs every 6 (six) hours as needed for wheezing or shortness of breath. 1 Inhaler 0  . Calcium Carbonate-Vitamin D (CALCIUM + D PO) Take 2 tablets by mouth daily.    . Canagliflozin (INVOKANA) 300 MG TABS Take 1 tablet by mouth every morning.     . Cholecalciferol (VITAMIN D3) 10000 UNITS capsule Take 10,000 Units by mouth once a week.  Sundays    . docusate sodium (COLACE) 100 MG  capsule Take 100 mg by mouth daily.    . fluconazole (DIFLUCAN) 100 MG tablet Take 1 tablet (100 mg total) by mouth once. May repeat prn 2 tablet 0  . hydrochlorothiazide (HYDRODIURIL) 12.5 MG tablet Take 12.5 mg by mouth every morning.     [name] Kitchen levothyroxine (SYNTHROID) 175 MCG tablet Take 175 mcg by mouth daily before breakfast.    . metFORMIN (GLUCOPHAGE) 1000 MG tablet Take 1,000 mg by mouth 2 (two) times daily with a meal.     . Multiple Vitamin (MULTIVITAMIN WITH MINERALS) TABS tablet Take 1 tablet by mouth daily.    . naproxen sodium (ALEVE) 220 MG tablet Take 220 mg by mouth at bedtime as needed (Pain).    [name] Kitchen omega-3 acid ethyl esters (LOVAZA) 1 G capsule Take 1 g by mouth 2 (two) times daily.    [name] Kitchen omeprazole (PRILOSEC) 20 MG capsule Take 20 mg by mouth daily.    [name] Kitchen oxyCODONE-acetaminophen (PERCOCET/ROXICET) 5-325 MG per tablet Take 1-2 tablets by mouth every 4 (four) hours as needed for severe pain. 60 tablet 0  . pantoprazole (PROTONIX) 40 MG tablet Take 40 mg by mouth.    Vladimir Faster Glycol-Propyl Glycol (SYSTANE OP) Apply 1 drop to eye at bedtime.    . rosuvastatin (CRESTOR) 10 MG tablet Take 10 mg by mouth every evening.     . sitaGLIPtin (JANUVIA) 100 MG tablet Take 100 mg by mouth daily.    . traMADol (ULTRAM) 50 MG tablet Take 1 tablet (50 mg total) by mouth every 6 (six) hours as needed. 20 tablet 0  . valsartan (DIOVAN) 320 MG tablet Take 320 mg by mouth every morning.     No current facility-administered medications for this visit.   Facility-Administered Medications Ordered in Other Visits  Medication Dose Route Frequency Provider Last Rate Last Dose  . Tbo-Filgrastim (GRANIX) injection 480 mcg  480 mcg Subcutaneous Weekly Heath Lark, MD   480 mcg at 10/18/14 1346    PHYSICAL EXAMINATION: ECOG PERFORMANCE STATUS: 0 - Asymptomatic  Filed Vitals:   10/18/14 1326  BP: 130/50  Pulse: 69  Temp: 97.8 F (36.6 C)  Resp: 18   Filed Weights   10/18/14 1326  Weight: 250 lb  6.4 oz (113.581 kg)    GENERAL:alert, no distress and comfortable SKIN: skin color, texture, turgor are normal, no rashes or significant lesions EYES: normal, Conjunctiva are pink and non-injected, sclera clear Musculoskeletal:no cyanosis of digits and no clubbing  NEURO: alert & oriented x 3 with fluent speech, no focal motor/sensory deficits  LABORATORY DATA:  I have reviewed the data as listed    Component Value Date/Time   NA 141 09/12/2014 0857   NA 136 08/25/2014 1538   K 4.0 09/12/2014 0857   K 3.6 08/25/2014 1538   CL 95* 08/25/2014 1538   CO2 28 09/12/2014 0857   CO2 27 08/25/2014 1538   GLUCOSE 121 09/12/2014 0857   GLUCOSE 108* 08/25/2014 1538   BUN 15.4 09/12/2014 0857   BUN 22 08/25/2014 1538   CREATININE 0.7 09/12/2014 0857   CREATININE 0.84 08/25/2014 1538   CREATININE 0.87 06/29/2010 1532   CALCIUM 9.5 09/12/2014 0857   CALCIUM 9.8 08/25/2014 1538   PROT 7.1 09/12/2014 0857   PROT 6.9 08/25/2014 1538   ALBUMIN 3.6 09/12/2014 0857   ALBUMIN 3.9 08/25/2014 1538   AST 19 09/12/2014 0857   AST 19 08/25/2014 1538   ALT 30  09/12/2014 0857   ALT 28 08/25/2014 1538   ALKPHOS 68 09/12/2014 0857   ALKPHOS 60 08/25/2014 1538   BILITOT 0.41 09/12/2014 0857   BILITOT 0.5 08/25/2014 1538   GFRNONAA 76 08/25/2014 1538   GFRNONAA >60 04/19/2010 2037   GFRAA 87 08/25/2014 1538   GFRAA  04/19/2010 2037    >60        The eGFR has been calculated using the MDRD equation. This calculation has not been validated in all clinical situations. eGFR's persistently <60 mL/min signify possible Chronic Kidney Disease.    No results found for: SPEP, UPEP  Lab Results  Component Value Date   WBC 2.8* 10/18/2014   NEUTROABS 0.3* 10/18/2014   HGB 13.1 10/18/2014   HCT 41.1 10/18/2014   MCV 87.1 10/18/2014   PLT 329 10/18/2014      Chemistry      Component Value Date/Time   NA 141 09/12/2014 0857   NA 136 08/25/2014 1538   K 4.0 09/12/2014 0857   K 3.6  08/25/2014 1538   CL 95* 08/25/2014 1538   CO2 28 09/12/2014 0857   CO2 27 08/25/2014 1538   BUN 15.4 09/12/2014 0857   BUN 22 08/25/2014 1538   CREATININE 0.7 09/12/2014 0857   CREATININE 0.84 08/25/2014 1538   CREATININE 0.87 06/29/2010 1532      Component Value Date/Time   CALCIUM 9.5 09/12/2014 0857   CALCIUM 9.8 08/25/2014 1538   ALKPHOS 68 09/12/2014 0857   ALKPHOS 60 08/25/2014 1538   AST 19 09/12/2014 0857   AST 19 08/25/2014 1538   ALT 30 09/12/2014 0857   ALT 28 08/25/2014 1538   BILITOT 0.41 09/12/2014 0857   BILITOT 0.5 08/25/2014 1538       RADIOGRAPHIC STUDIES: I reviewed the MRI dated 10/14/2014 with the patient I have personally reviewed the radiological images as listed and agreed with the findings in the report.  ASSESSMENT & PLAN:  History of ovarian cancer She has recurrent pelvic mass, suspicious for endometrial cancer. She is undergoing significant evaluation by gynecologist oncologist. I reviewed the most recent MRI dated 10/14/2014 with the patient. According to the patient, she has surgical date planned for 11/19/2014. I will continue to cover her with intermittent G-CSF injection to raise a white blood cell count prior to surgery. If not, chronic neutropenia has been associated with nonhealing wound. I plan to schedule injections on 3/25, 4/1, 4/11, 4/15 and 4/18 all the way until her surgery. If the plan is to be admitted to [facility] for the surgery, I will follow her as an inpatient. I received her back in my office on 11/29/2014 for further evaluation.   Chronic neutropenia The patient responded well to G-CSF. Infection has resolved. There is no role for IVIG. I recommend she treat returns on a weekly basis for G-CSF injection to keep her [place] greater than 1500.      No orders of the defined types were placed in this encounter.   All questions were answered. The patient knows to call the clinic with any problems, questions or  concerns. No barriers to learning was detected. I spent 25 minutes counseling the patient face to face. The total time spent in the appointment was 30 minutes and more than 50% was on counseling and review of test results     [facility], [place], [place] 10/18/2014 2:02 PM

## 2014-10-18 NOTE — Patient Instructions (Signed)
Tbo-Filgrastim injection What is this medicine? TBO-FILGRASTIM (T B O fil GRA stim) is a granulocyte colony-stimulating factor that stimulates the growth of neutrophils, a type of white blood cell important in the body's fight against infection. It is used to reduce the incidence of fever and infection in patients with certain types of cancer who are receiving chemotherapy that affects the bone marrow. This medicine may be used for other purposes; ask your health care provider or pharmacist if you have questions. COMMON BRAND NAME(S): Granix What should I tell my health care provider before I take this medicine? They need to know if you have any of these conditions: -ongoing radiation therapy -sickle cell anemia -an unusual or allergic reaction to tbo-filgrastim, filgrastim, pegfilgrastim, other medicines, foods, dyes, or preservatives -pregnant or trying to get pregnant -breast-feeding How should I use this medicine? This medicine is for injection under the skin. If you get this medicine at home, you will be taught how to prepare and give this medicine. Refer to the Instructions for Use that come with your medication packaging. Use exactly as directed. Take your medicine at regular intervals. Do not take your medicine more often than directed. It is important that you put your used needles and syringes in a special sharps container. Do not put them in a trash can. If you do not have a sharps container, call your pharmacist or healthcare provider to get one. Talk to your pediatrician regarding the use of this medicine in children. Special care may be needed. Overdosage: If you think you've taken too much of this medicine contact a poison control center or emergency room at once. Overdosage: If you think you have taken too much of this medicine contact a poison control center or emergency room at once. NOTE: This medicine is only for you. Do not share this medicine with others. What if I miss a  dose? It is important not to miss your dose. Call your doctor or health care professional if you miss a dose. What may interact with this medicine? This medicine may interact with the following medications: -medicines that may cause a release of neutrophils, such as lithium This list may not describe all possible interactions. Give your health care provider a list of all the medicines, herbs, non-prescription drugs, or dietary supplements you use. Also tell them if you smoke, drink alcohol, or use illegal drugs. Some items may interact with your medicine. What should I watch for while using this medicine? You may need blood work done while you are taking this medicine. What side effects may I notice from receiving this medicine? Side effects that you should report to your doctor or health care professional as soon as possible: -allergic reactions like skin rash, itching or hives, swelling of the face, lips, or tongue -shortness of breath or breathing problems -fever -pain, redness, or irritation at site where injected -pinpoint red spots on the skin -stomach or side pain, or pain at the shoulder -swelling -tiredness -trouble passing urine Side effects that usually do not require medical attention (Report these to your doctor or health care professional if they continue or are bothersome.): -bone pain -muscle pain This list may not describe all possible side effects. Call your doctor for medical advice about side effects. You may report side effects to FDA at 1-800-FDA-1088. Where should I keep my medicine? Keep out of the reach of children. Store in a refrigerator between 2 and 8 degrees C (36 and 46 degrees F). Keep in carton to   protect from light. Throw away this medicine if it is left out of the refrigerator for more than 5 consecutive days. Throw away any unused medicine after the expiration date. NOTE: This sheet is a summary. It may not cover all possible information. If you have  questions about this medicine, talk to your doctor, pharmacist, or health care provider.  2015, [employer] Standard. (2013-11-08 11:52:29)  

## 2014-10-18 NOTE — Telephone Encounter (Signed)
Pt confirmed labs/ov per 03/18 POF, gave pt AVS and Calendar......[name] Kitchen KJ

## 2014-10-21 ENCOUNTER — Encounter: Payer: Self-pay | Admitting: Gynecologic Oncology

## 2014-10-21 ENCOUNTER — Other Ambulatory Visit: Payer: Self-pay | Admitting: Gynecologic Oncology

## 2014-10-21 DIAGNOSIS — K6289 Other specified diseases of anus and rectum: Secondary | ICD-10-CM

## 2014-10-21 DIAGNOSIS — R1030 Lower abdominal pain, unspecified: Secondary | ICD-10-CM

## 2014-10-21 MED ORDER — TRAMADOL HCL 50 MG PO TABS: 50.0000 mg | ORAL_TABLET | Freq: Four times a day (QID) | ORAL | Status: DC | PRN

## 2014-10-25 ENCOUNTER — Ambulatory Visit (HOSPITAL_BASED_OUTPATIENT_CLINIC_OR_DEPARTMENT_OTHER): Payer: BLUE CROSS/BLUE SHIELD

## 2014-10-25 ENCOUNTER — Other Ambulatory Visit (HOSPITAL_BASED_OUTPATIENT_CLINIC_OR_DEPARTMENT_OTHER): Payer: BLUE CROSS/BLUE SHIELD

## 2014-10-25 DIAGNOSIS — D709 Neutropenia, unspecified: Secondary | ICD-10-CM

## 2014-10-25 LAB — CBC WITH DIFFERENTIAL/PLATELET
BASO%: 2.1 % — AB (ref 0.0–2.0)
Basophils Absolute: 0.1 10*3/uL (ref 0.0–0.1)
EOS%: 5.2 % (ref 0.0–7.0)
Eosinophils Absolute: 0.2 10*3/uL (ref 0.0–0.5)
HCT: 40 % (ref 34.8–46.6)
HEMOGLOBIN: 12.5 g/dL (ref 11.6–15.9)
LYMPH#: 1.7 10*3/uL (ref 0.9–3.3)
LYMPH%: 52.6 % — AB (ref 14.0–49.7)
MCH: 26.7 pg (ref 25.1–34.0)
MCHC: 31.3 g/dL — ABNORMAL LOW (ref 31.5–36.0)
MCV: 85.2 fL (ref 79.5–101.0)
MONO#: 0.7 10*3/uL (ref 0.1–0.9)
MONO%: 21.5 % — ABNORMAL HIGH (ref 0.0–14.0)
NEUT#: 0.6 10*3/uL — ABNORMAL LOW (ref 1.5–6.5)
NEUT%: 18.6 % — ABNORMAL LOW (ref 38.4–76.8)
PLATELETS: 263 10*3/uL (ref 145–400)
RBC: 4.7 10*6/uL (ref 3.70–5.45)
RDW: 16.8 % — ABNORMAL HIGH (ref 11.2–14.5)
WBC: 3.3 10*3/uL — ABNORMAL LOW (ref 3.9–10.3)

## 2014-10-25 MED ORDER — TBO-FILGRASTIM 480 MCG/0.8ML ~~LOC~~ SOSY
480.0000 ug | PREFILLED_SYRINGE | SUBCUTANEOUS | Status: DC
  Administered 2014-10-25: 480 ug via SUBCUTANEOUS
  Filled 2014-10-25: qty 0.8

## 2014-11-01 ENCOUNTER — Other Ambulatory Visit: Payer: Self-pay | Admitting: Hematology and Oncology

## 2014-11-01 ENCOUNTER — Other Ambulatory Visit (HOSPITAL_BASED_OUTPATIENT_CLINIC_OR_DEPARTMENT_OTHER): Payer: BLUE CROSS/BLUE SHIELD

## 2014-11-01 ENCOUNTER — Ambulatory Visit (HOSPITAL_BASED_OUTPATIENT_CLINIC_OR_DEPARTMENT_OTHER): Payer: BLUE CROSS/BLUE SHIELD

## 2014-11-01 DIAGNOSIS — D708 Other neutropenia: Secondary | ICD-10-CM | POA: Diagnosis not present

## 2014-11-01 DIAGNOSIS — D709 Neutropenia, unspecified: Secondary | ICD-10-CM

## 2014-11-01 LAB — CBC WITH DIFFERENTIAL/PLATELET
BASO%: 2.2 % — ABNORMAL HIGH (ref 0.0–2.0)
BASOS ABS: 0.1 10*3/uL (ref 0.0–0.1)
EOS ABS: 0.1 10*3/uL (ref 0.0–0.5)
EOS%: 3.4 % (ref 0.0–7.0)
HCT: 40.8 % (ref 34.8–46.6)
HGB: 12.9 g/dL (ref 11.6–15.9)
LYMPH#: 1.4 10*3/uL (ref 0.9–3.3)
LYMPH%: 42.4 % (ref 14.0–49.7)
MCH: 27 pg (ref 25.1–34.0)
MCHC: 31.7 g/dL (ref 31.5–36.0)
MCV: 85.1 fL (ref 79.5–101.0)
MONO#: 0.6 10*3/uL (ref 0.1–0.9)
MONO%: 19.4 % — AB (ref 0.0–14.0)
NEUT%: 32.6 % — ABNORMAL LOW (ref 38.4–76.8)
NEUTROS ABS: 1.1 10*3/uL — AB (ref 1.5–6.5)
Platelets: 272 10*3/uL (ref 145–400)
RBC: 4.79 10*6/uL (ref 3.70–5.45)
RDW: 17.2 % — AB (ref 11.2–14.5)
WBC: 3.3 10*3/uL — ABNORMAL LOW (ref 3.9–10.3)

## 2014-11-01 MED ORDER — TBO-FILGRASTIM 480 MCG/0.8ML ~~LOC~~ SOSY
480.0000 ug | PREFILLED_SYRINGE | SUBCUTANEOUS | Status: DC
  Administered 2014-11-01: 480 ug via SUBCUTANEOUS
  Filled 2014-11-01: qty 0.8

## 2014-11-06 NOTE — Patient Instructions (Addendum)
[name]  11/06/2014   Your procedure is scheduled on:  11/19/2014     _____________________________________________________________________    Report to [facility] Main  Entrance and follow signs to               [place] at      [phone].  Call this number if you have problems the morning of surgery [phone]   Remember: clear liquid diet beginning on Monday 11/18/2014.  Bottle of Magnesium Citrate morning of 11/18/2014 on Monday.    Do not eat food or drink liquids :After Midnight.     Take these medicines the morning of surgery with A SIP OF WATER:  , Synthroid,  Percocet if needed                               You may not have any metal on your body including hair pins and              piercings  Do not wear jewelry, make-up, lotions, powders or perfumes., deodorant.               Do not wear nail polish.  Do not shave  48 hours prior to surgery.               Do not bring valuables to the hospital. [place].  Contacts, dentures or bridgework may not be worn into surgery.  Leave suitcase in the car. After surgery it may be brought to your room.        Special Instructions:coughing and deep breathing exercises, leg exercises               Please read over the following fact sheets you were given: _____________________________________________________________________             [facility] - Preparing for Surgery Before surgery, you can play an important role.  Because skin is not sterile, your skin needs to be as free of germs as possible.  You can reduce the number of germs on your skin by washing with CHG (chlorahexidine gluconate) soap before surgery.  CHG is an antiseptic cleaner which kills germs and bonds with the skin to continue killing germs even after washing. Please DO NOT use if you have an allergy to CHG or antibacterial soaps.  If your skin becomes reddened/irritated stop  using the CHG and inform your nurse when you arrive at Short Stay. Do not shave (including legs and underarms) for at least 48 hours prior to the first CHG shower.  You may shave your face/neck. Please follow these instructions carefully:  1.  Shower with CHG Soap the night before surgery and the  morning of Surgery.  2.  If you choose to wash your hair, wash your hair first as usual with your  normal  shampoo.  3.  After you shampoo, rinse your hair and body thoroughly to remove the  shampoo.                           4.  Use CHG as you would any other liquid soap.  You can apply chg directly  to the skin and wash  Gently with a scrungie or clean washcloth.  5.  Apply the CHG Soap to your body ONLY FROM THE NECK DOWN.   Do not use on face/ open                           Wound or open sores. Avoid contact with eyes, ears mouth and genitals (private parts).                       Wash face,  Genitals (private parts) with your normal soap.             6.  Wash thoroughly, paying special attention to the area where your surgery  will be performed.  7.  Thoroughly rinse your body with warm water from the neck down.  8.  DO NOT shower/wash with your normal soap after using and rinsing off  the CHG Soap.                9.  Pat yourself dry with a clean towel.            10.  Wear clean pajamas.            11.  Place clean sheets on your bed the night of your first shower and do not  sleep with pets. Day of Surgery : Do not apply any lotions/deodorants the morning of surgery.  Please wear clean clothes to the hospital/surgery center.  FAILURE TO FOLLOW THESE INSTRUCTIONS MAY RESULT IN THE CANCELLATION OF YOUR SURGERY PATIENT SIGNATURE_________________________________  NURSE SIGNATURE__________________________________  ________________________________________________________________________  WHAT IS A BLOOD TRANSFUSION? Blood Transfusion Information  A transfusion is the  replacement of blood or some of its parts. Blood is made up of multiple cells which provide different functions.  Red blood cells carry oxygen and are used for blood loss replacement.  White blood cells fight against infection.  Platelets control bleeding.  Plasma helps clot blood.  Other blood products are available for specialized needs, such as hemophilia or other clotting disorders. BEFORE THE TRANSFUSION  Who gives blood for transfusions?   Healthy volunteers who are fully evaluated to make sure their blood is safe. This is blood bank blood. Transfusion therapy is the safest it has ever been in the practice of medicine. Before blood is taken from a donor, a complete history is taken to make sure that person has no history of diseases nor engages in risky social behavior (examples are intravenous drug use or sexual activity with multiple partners). The donor's travel history is screened to minimize risk of transmitting infections, such as malaria. The donated blood is tested for signs of infectious diseases, such as HIV and hepatitis. The blood is then tested to be sure it is compatible with you in order to minimize the chance of a transfusion reaction. If you or a relative donates blood, this is often done in anticipation of surgery and is not appropriate for emergency situations. It takes many days to process the donated blood. RISKS AND COMPLICATIONS Although transfusion therapy is very safe and saves many lives, the main dangers of transfusion include:  1. Getting an infectious disease. 2. Developing a transfusion reaction. This is an allergic reaction to something in the blood you were given. Every precaution is taken to prevent this. The decision to have a blood transfusion has been considered carefully by your caregiver before blood is given. Blood is not given unless the benefits outweigh  the risks. AFTER THE TRANSFUSION  Right after receiving a blood transfusion, you will usually  feel much better and more energetic. This is especially true if your red blood cells have gotten low (anemic). The transfusion raises the level of the red blood cells which carry oxygen, and this usually causes an energy increase.  The nurse administering the transfusion will monitor you carefully for complications. HOME CARE INSTRUCTIONS  No special instructions are needed after a transfusion. You may find your energy is better. Speak with your caregiver about any limitations on activity for underlying diseases you may have. SEEK MEDICAL CARE IF:   Your condition is not improving after your transfusion.  You develop redness or irritation at the intravenous (IV) site. SEEK IMMEDIATE MEDICAL CARE IF:  Any of the following symptoms occur over the next 12 hours:  Shaking chills.  You have a temperature by mouth above 102 F (38.9 C), not controlled by medicine.  Chest, back, or muscle pain.  People around you feel you are not acting correctly or are confused.  Shortness of breath or difficulty breathing.  Dizziness and fainting.  You get a rash or develop hives.  You have a decrease in urine output.  Your urine turns a dark color or changes to pink, red, or brown. Any of the following symptoms occur over the next 10 days:  You have a temperature by mouth above 102 F (38.9 C), not controlled by medicine.  Shortness of breath.  Weakness after normal activity.  The white part of the eye turns yellow (jaundice).  You have a decrease in the amount of urine or are urinating less often.  Your urine turns a dark color or changes to pink, red, or brown. Document Released: 07/16/2000 Document Revised: 10/11/2011 Document Reviewed: 03/04/2008 ExitCare Patient Information 2014 [place].  _______________________________________________________________________  Incentive Spirometer  An incentive spirometer is a tool that can help keep your lungs clear and active. This tool  measures how well you are filling your lungs with each breath. Taking long deep breaths may help reverse or decrease the chance of developing breathing (pulmonary) problems (especially infection) following:  A long period of time when you are unable to move or be active. BEFORE THE PROCEDURE   If the spirometer includes an indicator to show your best effort, your nurse or respiratory therapist will set it to a desired goal.  If possible, sit up straight or lean slightly forward. Try not to slouch.  Hold the incentive spirometer in an upright position. INSTRUCTIONS FOR USE  3. Sit on the edge of your bed if possible, or sit up as far as you can in bed or on a chair. 4. Hold the incentive spirometer in an upright position. 5. Breathe out normally. 6. Place the mouthpiece in your mouth and seal your lips tightly around it. 7. Breathe in slowly and as deeply as possible, raising the piston or the ball toward the top of the column. 8. Hold your breath for 3-5 seconds or for as long as possible. Allow the piston or ball to fall to the bottom of the column. 9. Remove the mouthpiece from your mouth and breathe out normally. 10. Rest for a few seconds and repeat Steps 1 through 7 at least 10 times every 1-2 hours when you are awake. Take your time and take a few normal breaths between deep breaths. 11. The spirometer may include an indicator to show your best effort. Use the indicator as a goal to work  toward during each repetition. 12. After each set of 10 deep breaths, practice coughing to be sure your lungs are clear. If you have an incision (the cut made at the time of surgery), support your incision when coughing by placing a pillow or rolled up towels firmly against it. Once you are able to get out of bed, walk around indoors and cough well. You may stop using the incentive spirometer when instructed by your caregiver.  RISKS AND COMPLICATIONS  Take your time so you do not get dizzy or  light-headed.  If you are in pain, you may need to take or ask for pain medication before doing incentive spirometry. It is harder to take a deep breath if you are having pain. AFTER USE  Rest and breathe slowly and easily.  It can be helpful to keep track of a log of your progress. Your caregiver can provide you with a simple table to help with this. If you are using the spirometer at home, follow these instructions: [place] IF:   You are having difficultly using the spirometer.  You have trouble using the spirometer as often as instructed.  Your pain medication is not giving enough relief while using the spirometer.  You develop fever of 100.5 F (38.1 C) or higher. SEEK IMMEDIATE MEDICAL CARE IF:   You cough up bloody sputum that had not been present before.  You develop fever of 102 F (38.9 C) or greater.  You develop worsening pain at or near the incision site. MAKE SURE YOU:   Understand these instructions.  Will watch your condition.  Will get help right away if you are not doing well or get worse. Document Released: 11/29/2006 Document Revised: 10/11/2011 Document Reviewed: 01/30/2007 [facility] Patient Information 2014 [place], [place].   ________________________________________________________________________

## 2014-11-06 NOTE — Progress Notes (Signed)
Called and left message on ostomy phone of [phone] regarding patient coming in for preop appointment on 11/07/2014 at 1030am.  Also paged- [phone].

## 2014-11-07 ENCOUNTER — Encounter (HOSPITAL_COMMUNITY)
Admission: RE | Admit: 2014-11-07 | Discharge: 2014-11-07 | Disposition: A | Discharge status: Home / Self Care | Payer: BLUE CROSS/BLUE SHIELD | Source: Ambulatory Visit | Attending: Gynecologic Oncology | Admitting: Gynecologic Oncology

## 2014-11-07 ENCOUNTER — Encounter (HOSPITAL_COMMUNITY): Payer: Self-pay

## 2014-11-07 DIAGNOSIS — Z01812 Encounter for preprocedural laboratory examination: Secondary | ICD-10-CM | POA: Diagnosis present

## 2014-11-07 DIAGNOSIS — Z01810 Encounter for preprocedural cardiovascular examination: Secondary | ICD-10-CM | POA: Insufficient documentation

## 2014-11-07 DIAGNOSIS — C569 Malignant neoplasm of unspecified ovary: Secondary | ICD-10-CM | POA: Diagnosis not present

## 2014-11-07 HISTORY — DX: Cardiac arrhythmia, unspecified: I49.9

## 2014-11-07 LAB — COMPREHENSIVE METABOLIC PANEL
ALT: 33 U/L (ref 0–35)
AST: 23 U/L (ref 0–37)
Albumin: 4.4 g/dL (ref 3.5–5.2)
Alkaline Phosphatase: 76 U/L (ref 39–117)
Anion gap: 8 (ref 5–15)
BUN: 21 mg/dL (ref 6–23)
CHLORIDE: 98 mmol/L (ref 96–112)
CO2: 30 mmol/L (ref 19–32)
Calcium: 9.5 mg/dL (ref 8.4–10.5)
Creatinine, Ser: 0.66 mg/dL (ref 0.50–1.10)
GLUCOSE: 84 mg/dL (ref 70–99)
Potassium: 3.7 mmol/L (ref 3.5–5.1)
SODIUM: 136 mmol/L (ref 135–145)
TOTAL PROTEIN: 8 g/dL (ref 6.0–8.3)
Total Bilirubin: 0.6 mg/dL (ref 0.3–1.2)

## 2014-11-07 LAB — CBC WITH DIFFERENTIAL/PLATELET
BASOS PCT: 2 % — AB (ref 0–1)
Basophils Absolute: 0.1 10*3/uL (ref 0.0–0.1)
Eosinophils Absolute: 0.1 10*3/uL (ref 0.0–0.7)
Eosinophils Relative: 2 % (ref 0–5)
HCT: 43.2 % (ref 36.0–46.0)
HEMOGLOBIN: 13.7 g/dL (ref 12.0–15.0)
LYMPHS PCT: 48 % — AB (ref 12–46)
Lymphs Abs: 1.4 10*3/uL (ref 0.7–4.0)
MCH: 27.8 pg (ref 26.0–34.0)
MCHC: 31.7 g/dL (ref 30.0–36.0)
MCV: 87.8 fL (ref 78.0–100.0)
Monocytes Absolute: 0.6 10*3/uL (ref 0.1–1.0)
Monocytes Relative: 20 % — ABNORMAL HIGH (ref 3–12)
Neutro Abs: 0.8 10*3/uL — ABNORMAL LOW (ref 1.7–7.7)
Neutrophils Relative %: 28 % — ABNORMAL LOW (ref 43–77)
Platelets: 286 10*3/uL (ref 150–400)
RBC: 4.92 MIL/uL (ref 3.87–5.11)
RDW: 16.2 % — ABNORMAL HIGH (ref 11.5–15.5)
WBC: 3 10*3/uL — AB (ref 4.0–10.5)

## 2014-11-07 LAB — URINALYSIS, ROUTINE W REFLEX MICROSCOPIC
BILIRUBIN URINE: NEGATIVE
Glucose, UA: >1000 mg/dL — AB
Hgb urine dipstick: NEGATIVE
KETONES UR: NEGATIVE mg/dL
LEUKOCYTES UA: NEGATIVE
NITRITE: NEGATIVE
Protein, ur: NEGATIVE mg/dL
Specific Gravity, Urine: 1.026 (ref 1.005–1.030)
UROBILINOGEN UA: 0.2 mg/dL (ref 0.0–1.0)
pH: 5 (ref 5.0–8.0)

## 2014-11-07 LAB — URINE MICROSCOPIC-ADD ON

## 2014-11-07 NOTE — Progress Notes (Signed)
2V CXR- 08/25/2014 in EPIC  Also Ct Chest- 09/12/2014 in [place]- Oncology- 10/18/2014 EPIC.

## 2014-11-07 NOTE — Progress Notes (Signed)
Left message on voice mail of OB/GYN office regarding Dr Denman [name] bowel prep and Dr Johney Maine bowel prep .  Instructed patient to follow bowel prep of Dr Johney Maine .  Asked them to call back if any changes.  Also patient had a question regarding the work order ? And they need to call patient regarding this.  Patient was present at preop appointment at the time this phone call placed.

## 2014-11-07 NOTE — Progress Notes (Signed)
Joylene John, NP called back and left message stating that patient should only do Bowel Prep per Dr Johney [place] and to disregard the Bowel Prep per Dr Everitt Amber.  And that she would give patient a call regarding the work order.  I called patient on cell phone and left her a message regarding above and asked patient to return call.

## 2014-11-07 NOTE — Progress Notes (Signed)
Repaged and left message on office phone of ostomy nurse- Margarita Grizzle McNichol regarding date and time of patient's surgery.

## 2014-11-07 NOTE — Progress Notes (Signed)
CBC/DIFF and U/A with micro results done 11/07/2014 routed via EPIC to Dr Denman [name] and Dr Johney Maine.

## 2014-11-07 NOTE — Progress Notes (Signed)
Final EKG done 11/07/2014 in [facility].

## 2014-11-07 NOTE — Progress Notes (Signed)
[name], [place] Nurse in department and made her aware that patient having surgery on 11/19/14 with time of surgery and arrival time.  Also told her that I had Lm on office phone and paged on 11/06/2014 along with leaving a message on office phone on 11/07/2014 along with page.

## 2014-11-08 LAB — HEMOGLOBIN A1C
Hgb A1c MFr Bld: 6.3 % — ABNORMAL HIGH (ref 4.8–5.6)
Mean Plasma Glucose: 134 mg/dL

## 2014-11-08 NOTE — Progress Notes (Signed)
HGA1C done 11/07/2014 faxed via EPIC to Dr Johney Maine and Dr Denman [name].

## 2014-11-11 ENCOUNTER — Other Ambulatory Visit (HOSPITAL_BASED_OUTPATIENT_CLINIC_OR_DEPARTMENT_OTHER): Payer: BLUE CROSS/BLUE SHIELD

## 2014-11-11 ENCOUNTER — Encounter: Payer: Self-pay | Admitting: Gynecologic Oncology

## 2014-11-11 ENCOUNTER — Ambulatory Visit (HOSPITAL_BASED_OUTPATIENT_CLINIC_OR_DEPARTMENT_OTHER): Payer: BLUE CROSS/BLUE SHIELD

## 2014-11-11 ENCOUNTER — Ambulatory Visit: Payer: BLUE CROSS/BLUE SHIELD | Attending: Gynecologic Oncology | Admitting: Gynecologic Oncology

## 2014-11-11 VITALS — BP 137/69 | HR 65 | Temp 97.9°F | Resp 18 | Ht 62.0 in | Wt 250.6 lb

## 2014-11-11 DIAGNOSIS — Z8543 Personal history of malignant neoplasm of ovary: Secondary | ICD-10-CM

## 2014-11-11 DIAGNOSIS — D709 Neutropenia, unspecified: Secondary | ICD-10-CM

## 2014-11-11 DIAGNOSIS — C541 Malignant neoplasm of endometrium: Secondary | ICD-10-CM

## 2014-11-11 DIAGNOSIS — D708 Other neutropenia: Secondary | ICD-10-CM | POA: Diagnosis not present

## 2014-11-11 DIAGNOSIS — C569 Malignant neoplasm of unspecified ovary: Secondary | ICD-10-CM

## 2014-11-11 LAB — URINALYSIS, MICROSCOPIC - CHCC
BILIRUBIN (URINE): NEGATIVE
BLOOD: NEGATIVE
GLUCOSE UR CHCC: 2000 mg/dL
Ketones: NEGATIVE mg/dL
LEUKOCYTE ESTERASE: NEGATIVE
Nitrite: NEGATIVE
Protein: NEGATIVE mg/dL
SPECIFIC GRAVITY, URINE: 1.015 (ref 1.003–1.035)
Urobilinogen, UR: 0.2 mg/dL (ref 0.2–1)
pH: 5 (ref 4.6–8.0)

## 2014-11-11 LAB — CBC WITH DIFFERENTIAL/PLATELET
BASO%: 3.1 % — AB (ref 0.0–2.0)
Basophils Absolute: 0.1 10*3/uL (ref 0.0–0.1)
EOS%: 4.1 % (ref 0.0–7.0)
Eosinophils Absolute: 0.1 10*3/uL (ref 0.0–0.5)
HEMATOCRIT: 40.3 % (ref 34.8–46.6)
HGB: 12.8 g/dL (ref 11.6–15.9)
LYMPH#: 1 10*3/uL (ref 0.9–3.3)
LYMPH%: 49.3 % (ref 14.0–49.7)
MCH: 27.1 pg (ref 25.1–34.0)
MCHC: 31.8 g/dL (ref 31.5–36.0)
MCV: 85.4 fL (ref 79.5–101.0)
MONO#: 0.4 10*3/uL (ref 0.1–0.9)
MONO%: 19.1 % — AB (ref 0.0–14.0)
NEUT#: 0.5 10*3/uL — CL (ref 1.5–6.5)
NEUT%: 24.4 % — AB (ref 38.4–76.8)
Platelets: 293 10*3/uL (ref 145–400)
RBC: 4.72 10*6/uL (ref 3.70–5.45)
RDW: 17.2 % — ABNORMAL HIGH (ref 11.2–14.5)
WBC: 2 10*3/uL — ABNORMAL LOW (ref 3.9–10.3)

## 2014-11-11 MED ORDER — TBO-FILGRASTIM 480 MCG/0.8ML ~~LOC~~ SOSY
480.0000 ug | PREFILLED_SYRINGE | SUBCUTANEOUS | Status: DC
  Administered 2014-11-11: 480 ug via SUBCUTANEOUS
  Filled 2014-11-11: qty 0.8

## 2014-11-11 NOTE — Patient Instructions (Addendum)
We will call you with the urine results from today. Plan for surgery 11/19/14 with Dr. Denman [name] and Dr. Johney Maine.   The wound ostomy nurse will see you the morning of surgery.

## 2014-11-11 NOTE — Progress Notes (Addendum)
Called and spoke with Maudie Flakes , ostomy nurse.  She is aware patient time of surgery on 11/19/14 at 1025am.  Patient to arrive at [place] am.  Maudie Flakes will arrive at 0900am in Short Stay to mark patient.  Joylene John, NP called and she is aware patient will be marked at 0900am day of surgery.  And note on front of chart .

## 2014-11-11 NOTE — Progress Notes (Signed)
Preoperative Followup Note: Gyn-Onc  Consult was originally requested by Dr. Alvy Bimler for the evaluation of [name] 59 y.o. 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  CC:  Chief Complaint  Patient presents with  . Ovarian Cancer    Assessment/Plan:  [name]  is a 59 y.o.  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 I reviewed her imaging records with her. We discussed that the MRI shows that the lesion is approximately 3.5 x 5 cm apparently in the right side of the rectum. There is no obvious involvement of the right ureter (or left ureter) or bladder however that is a potential concern given the close proximity of the structures. The PET scan shows no evidence of distant metastatic disease.  I'm still recommending primary resection of this mass given his bulky nature and my concern that sterilization with radiation alone carries with it limited likelihood of cure, and given her underlying chronic idiopathic neutropenia she is likely to struggle with receiving adequate doses of chemotherapy for treatment. After discussion with her surgeon Dr. Alwyn Pea with determine optimal treatment would be primary surgical debulking with an upper vaginectomy, radical parametrectomy, a low anterior resection. Postoperatively we will radiate the pelvis. We will attempt this minimally invasively with robotic assistance however the patient understands there is a chance for conversion to laparotomy if this is approach is not feasible. I discussed the possibility for a ureteral or bladder involvement which might require resection of the structures and reimplantation of the ureter into the bladder. I discussed that this can be complicated by long-term fistula formation and multiple procedures to amend this situation. We have sent bladder cytology today to better evaluate for any gross bladder involvement however there does not appear to  be so on the MRI oral my physical examination.  Because this is a recurrence of her tumor, because she has a condition significantly increasing her risk for sepsis if she were to develop an anastomotic leak, and because she will require postoperative radiation, we are recommending a primary end colostomy rather than re-anastomosis and diverting ileostomy. I discussed with the patient that an end colostomy is potentially reversible stoma. However such reversal would require major abdominal surgery and risk in a radiated patient. Therefore it is possible that her an end colostomy may be permanent in her case. I discussed that in the situation of recurrence we typically wait until the passage of time has proven that a surgical approach had achieved cure for attempting such reversal. I would recommend reversal no sooner than 3 years postoperatively.  She will be evaluated and marked by the ostomy nurses in the preoperative area to guide [place] some placement of her colostomy or if performed ileostomy.  I again discussed surgical risks with [place] including  bleeding, infection, damage to internal organs (such as bladder,ureters, bowels), blood clot, reoperation and rehospitalization. I discussed that infectious risks are elevated secondary to her underlying neutropenia. Last week her total WBC was 2.0. She received granulocyte colony stim factor last week and will again receive doses preoperatively next week.  HPI: [name] is a 59 year old woman with a history of endometrial and ovarian endometrioid carcinoma treated in 2006 by Dr. [name] at [facility] in [place]. Her surgery (TAH, BSO) was followed by adjuvant chemotherapy with carboplatin plus paclitaxel due to the ovarian involvement and the presence of a cul de sac lesion also positive for disease. She denies receiving adjuvant radiation  therapy. She is unclear if she had metastatic endometrial cancer to the ovary or duel primaries.  She had a complete response to therapy however developed leukopenia in July 2010. 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 She sees Dr. Simeon Craft such for this and is treated with G-CSF injections.  She began experiencing rectal pain approximately 3 months ago. She also reports narrowing of caliber of the stool. She denies hematochezia. She does report approximately 3 months of vaginal spotting. She's had no specific follow-up for her gynecologic cancers in the past 4 years.  As part of workup of her rectal pain she underwent a CT scan of the abdomen and pelvis on 09/12/2014. This demonstrated a new right perirectal mass abutting the vaginal cuff measuring 3.8 x 4.9 cm. There is a limited fat plane between the mass and the rectum posteriorly. Rectal invasion could not be excluded. There were no other masses identified in the abdomen and pelvis or lymphadenopathy. There is no other evidence of metastatic disease or recurrent disease. There was no hydronephrosis. A CA-125 drawn on 09/12/2014 was normal at 10.  PET was negative for extrapelvic disease. MRI defined the lesion as a 3.5x5cm lesion to the right of the rectum at the vaginal cuff.  Colonoscopy was performed on 10/02/14 with transrectal [place] and biopsy and this revealed endometrioid adenocarcinoma. Of note, the lesion was not seen within the lumen of the rectum.   Current Meds:  Outpatient Encounter Prescriptions as of 11/11/2014  Medication Sig  . albuterol (PROVENTIL HFA;VENTOLIN HFA) 108 (90 BASE) MCG/ACT inhaler Inhale 2 puffs into the lungs every 6 (six) hours as needed for wheezing or shortness of breath.  . bisacodyl (DULCOLAX) 5 MG EC tablet Take 20 mg by mouth once. 7 am day before procedure  . Calcium Carbonate-Vitamin D (CALCIUM + D PO) Take 2 tablets by mouth daily.  . Canagliflozin (INVOKANA) 300 MG TABS Take 1 tablet by  mouth every morning.   . Cholecalciferol (VITAMIN D3) 10000 UNITS capsule Take 10,000 Units by mouth once a week. Sundays  . docusate sodium (COLACE) 100 MG capsule Take 100 mg by mouth daily.  . hydrochlorothiazide (HYDRODIURIL) 12.5 MG tablet Take 12.5 mg by mouth every morning.   [name] Kitchen levothyroxine (SYNTHROID) 175 MCG tablet Take 175 mcg by mouth daily before breakfast.  . metFORMIN (GLUCOPHAGE) 1000 MG tablet Take 1,000 mg by mouth 2 (two) times daily with a meal.   . Multiple Vitamin (MULTIVITAMIN WITH MINERALS) TABS tablet Take 1 tablet by mouth daily.  . naproxen sodium (ALEVE) 220 MG tablet Take 220 mg by mouth every 12 (twelve) hours.   [name] Kitchen omega-3 acid ethyl esters (LOVAZA) 1 G capsule Take 1 g by mouth 2 (two) times daily.  [name] Kitchen omeprazole (PRILOSEC) 20 MG capsule Take 20 mg by mouth daily.  Glory Rosebush VERIO test strip   . oxyCODONE-acetaminophen (PERCOCET/ROXICET) 5-325 MG per tablet Take 1-2 tablets by mouth every 4 (four) hours as needed for severe pain.  Vladimir Faster Glycol-Propyl Glycol (SYSTANE OP) Apply 1 drop to eye daily as needed (Dry eyes).   . polyethylene glycol (MIRALAX / GLYCOLAX) packet Take 17 g by mouth daily. 10 am day before procedure  . rosuvastatin (CRESTOR) 10 MG tablet Take 10 mg by mouth every evening.   . sitaGLIPtin (JANUVIA) 100 MG tablet Take 100 mg by mouth daily.  . Tbo-Filgrastim (GRANIX) 480 MCG/0.8ML SOSY injection Inject 480 mcg  into the skin once a week.  . traMADol (ULTRAM) 50 MG tablet Take 1-2 tablets (50-100 mg total) by mouth every 6 (six) hours as needed. (Patient taking differently: Take 100 mg by mouth at bedtime. )  . valsartan (DIOVAN) 320 MG tablet Take 320 mg by mouth every morning.  . fluconazole (DIFLUCAN) 100 MG tablet Take 1 tablet (100 mg total) by mouth once. May repeat prn (Patient not taking: Reported on 11/11/2014)  . metroNIDAZOLE (FLAGYL) 500 MG tablet Take 1,000 mg by mouth 3 (three) times daily. 2 tablets at 2pm.3pm, and 10pm. Day  before procedure  . neomycin (MYCIFRADIN) 500 MG tablet Take 1,000 mg by mouth 3 (three) times daily. 2 tablets at 2pm.3pm, and 10pm. Day before procedure  . [DISCONTINUED] Tbo-Filgrastim (GRANIX) injection 480 mcg     Allergy:  Allergies  Allergen Reactions  . Penicillins Swelling    Facial swelling  . Adhesive [Tape]     blisters  . Cefaclor Rash    Ceclor  . Erythromycin     gastritis  . Trimethoprim Rash  . Sulfa Antibiotics Rash    Social Hx:   History   Social History  . Marital Status: Married    Spouse Name: N/A  . Number of Children: N/A  . Years of Education: N/A   Occupational History  . Not on file.   Social History Main Topics  . Smoking status: Never Smoker   . Smokeless tobacco: Never Used  . Alcohol Use: Yes     Comment: rare social  . Drug Use: No  . Sexual Activity: Not Currently   Other Topics Concern  . Not on file   Social History Narrative    Past Surgical Hx:  Past Surgical History  Procedure Laterality Date  . Appendectomy    . Tonsillectomy    . Cholecystectomy      laparoscopic  . Eye surgery      pytosis of eyelids-child  . Colonoscopy with propofol N/A 08/21/2013    Procedure: COLONOSCOPY WITH PROPOFOL;  Surgeon: Cleotis Nipper, MD;  Location: [facility];  Service: Endoscopy;  Laterality: N/A;  . Abdominal hysterectomy      staging for Ovarian cancer  . Eus N/A 10/02/2014    Procedure: LOWER ENDOSCOPIC ULTRASOUND (EUS);  Surgeon: Arta Silence, MD;  Location: [facility];  Service: Endoscopy;  Laterality: N/A;    Past Medical Hx:  Past Medical History  Diagnosis Date  . Hypothyroidism 12/14/2011  . Benign essential HTN 12/14/2011  . Neutropenia   . Postmenopausal bleeding 09/05/2014  . Anemia     Planned Neupogen injections for chronic neutropenia  . Transfusion history     '06-s/p hysterectomy and staging  . Multiple thyroid nodules y-8    Managed by Dr. Harlow Asa  . GERD (gastroesophageal reflux disease)   . Polyp  of duodenum y-2  . DM type 2 (diabetes mellitus, type 2) 12/14/2011  . Dysrhythmia     skipped beat with occasional coffee   . Hx of bronchitis   . Depression   . Hiatal hernia y-12    hx of small hiatal hernia   . Neutropenia     chronic   . Cancer     '06-Ovarian/ Uterine Cancer-chemotherapy    Past Gynecological History:  G0. Endometrial and ovarian cancer s/p TAH, BSO and chemotherapy  With carboplatin and paclitaxel  No LMP recorded. Patient has had a hysterectomy.  Family Hx:  Family History  Problem Relation Age of Onset  .  Cancer Mother     stomach ca  . Hypertension Mother   . Cancer Father     prostate ca  . Diabetes Father   . Diabetes Sister   . Hypertension Brother y-10    Review of Systems:  Constitutional  Feels well,    ENT Normal appearing ears and nares bilaterally Skin/Breast  No rash, sores, jaundice, itching, dryness Cardiovascular  No chest pain, shortness of breath, or edema  Pulmonary  No cough or wheeze.  Gastro Intestinal  No nausea, vomitting, or diarrhoea. No bright red blood per rectum, no abdominal pain, + in bowel movement (narrowed stool callibre), no constipation.  Genito Urinary  No frequency, urgency, dysuria, + vaginal spotting Musculo Skeletal  No myalgia, arthralgia, joint swelling or pain  Neurologic  No weakness, numbness, change in gait,  Psychology  No depression, anxiety, insomnia.   Vitals:  141/74, pulse 84, weight 251, temp 98.2, RR 18  Physical Exam: WD in NAD Neck  Supple NROM, without any enlargements.  Lymph Node Survey No cervical supraclavicular or inguinal adenopathy Cardiovascular  Pulse normal rate, regularity and rhythm. S1 and S2 normal.  Lungs  Clear to auscultation bilateraly, without wheezes/crackles/rhonchi. Good air movement.  Skin  No rash/lesions/breakdown  Psychiatry  Alert and oriented to person, place, and time  Abdomen  Normoactive bowel sounds, abdomen soft, non-tender and morbidly  obese without evidence of hernia.  Back No CVA tenderness Genito Urinary  Vulva/vagina: Normal external female genitalia.  No lesions. No discharge or bleeding.  Bladder/urethra:  No lesions or masses, well supported bladder  Vagina: erythematous nodular area at right vaginal apex. Biopsied with kevorkian biopsy forceps. Hemostatic.  Cervix: surgically absent  Uterus: surgically absent   Adnexa: no adnexal masses. However a 5 cm mass is appreciated in the midline adherent to the vagina and rectum. It is irregular in shape, cauliflower-like. It is mobile. It is nontender. Rectal  Good tone, rectovaginal mass appreciated (as above) approximately 4-5cm from anal verge. It is easily appreciated with the examining hand.  Extremities  No bilateral cyanosis, clubbing or edema.   Donaciano Eva, MD   11/11/2014, 5:57 PM

## 2014-11-12 ENCOUNTER — Telehealth: Payer: Self-pay | Admitting: *Deleted

## 2014-11-12 ENCOUNTER — Encounter: Payer: Self-pay | Admitting: *Deleted

## 2014-11-12 ENCOUNTER — Telehealth: Payer: Self-pay | Admitting: Gynecologic Oncology

## 2014-11-12 NOTE — Telephone Encounter (Signed)
Informed her of cytology results (negative) from washings. She understands that we may still have to perform urologic surgery if the tumor is in close approximation with the bladder/ureter, however based on the cytology and MRI results, it is unlikely to be full thickness invasion. Donaciano Eva, MD

## 2014-11-12 NOTE — Telephone Encounter (Signed)
Per [name] John, NP patient notified that urinalysis from yesterday did not show any infection. Told patient that the urine cytology is not back yet but when it is we will give her a call with the results. Patient appreciative of call.

## 2014-11-15 ENCOUNTER — Other Ambulatory Visit (HOSPITAL_BASED_OUTPATIENT_CLINIC_OR_DEPARTMENT_OTHER): Payer: BLUE CROSS/BLUE SHIELD

## 2014-11-15 ENCOUNTER — Ambulatory Visit (HOSPITAL_BASED_OUTPATIENT_CLINIC_OR_DEPARTMENT_OTHER): Payer: BLUE CROSS/BLUE SHIELD

## 2014-11-15 VITALS — BP 120/68 | HR 67 | Temp 98.2°F

## 2014-11-15 DIAGNOSIS — Z8543 Personal history of malignant neoplasm of ovary: Secondary | ICD-10-CM

## 2014-11-15 DIAGNOSIS — D709 Neutropenia, unspecified: Secondary | ICD-10-CM

## 2014-11-15 DIAGNOSIS — D708 Other neutropenia: Secondary | ICD-10-CM

## 2014-11-15 DIAGNOSIS — R1900 Intra-abdominal and pelvic swelling, mass and lump, unspecified site: Secondary | ICD-10-CM | POA: Diagnosis not present

## 2014-11-15 LAB — CBC WITH DIFFERENTIAL/PLATELET
BASO%: 2.2 % — ABNORMAL HIGH (ref 0.0–2.0)
BASOS ABS: 0.1 10*3/uL (ref 0.0–0.1)
EOS ABS: 0.1 10*3/uL (ref 0.0–0.5)
EOS%: 2.9 % (ref 0.0–7.0)
HCT: 43.8 % (ref 34.8–46.6)
HEMOGLOBIN: 13.7 g/dL (ref 11.6–15.9)
LYMPH#: 1.8 10*3/uL (ref 0.9–3.3)
LYMPH%: 36.7 % (ref 14.0–49.7)
MCH: 26.9 pg (ref 25.1–34.0)
MCHC: 31.4 g/dL — ABNORMAL LOW (ref 31.5–36.0)
MCV: 85.6 fL (ref 79.5–101.0)
MONO#: 0.8 10*3/uL (ref 0.1–0.9)
MONO%: 17.5 % — ABNORMAL HIGH (ref 0.0–14.0)
NEUT%: 40.7 % (ref 38.4–76.8)
NEUTROS ABS: 2 10*3/uL (ref 1.5–6.5)
Platelets: 355 10*3/uL (ref 145–400)
RBC: 5.11 10*6/uL (ref 3.70–5.45)
RDW: 17.4 % — AB (ref 11.2–14.5)
WBC: 4.8 10*3/uL (ref 3.9–10.3)

## 2014-11-15 MED ORDER — TBO-FILGRASTIM 480 MCG/0.8ML ~~LOC~~ SOSY
480.0000 ug | PREFILLED_SYRINGE | SUBCUTANEOUS | Status: DC
  Administered 2014-11-15: 480 ug via SUBCUTANEOUS
  Filled 2014-11-15: qty 0.8

## 2014-11-18 ENCOUNTER — Other Ambulatory Visit (HOSPITAL_BASED_OUTPATIENT_CLINIC_OR_DEPARTMENT_OTHER): Payer: BLUE CROSS/BLUE SHIELD

## 2014-11-18 ENCOUNTER — Ambulatory Visit: Payer: BLUE CROSS/BLUE SHIELD

## 2014-11-18 VITALS — BP 124/64 | HR 67 | Temp 98.2°F

## 2014-11-18 DIAGNOSIS — D709 Neutropenia, unspecified: Secondary | ICD-10-CM

## 2014-11-18 DIAGNOSIS — Z8543 Personal history of malignant neoplasm of ovary: Secondary | ICD-10-CM

## 2014-11-18 DIAGNOSIS — D708 Other neutropenia: Secondary | ICD-10-CM | POA: Diagnosis not present

## 2014-11-18 LAB — CBC WITH DIFFERENTIAL/PLATELET
BASO%: 0.5 % (ref 0.0–2.0)
BASOS ABS: 0.1 10*3/uL (ref 0.0–0.1)
EOS%: 1.7 % (ref 0.0–7.0)
Eosinophils Absolute: 0.2 10*3/uL (ref 0.0–0.5)
HCT: 41.5 % (ref 34.8–46.6)
HEMOGLOBIN: 13.1 g/dL (ref 11.6–15.9)
LYMPH%: 23.1 % (ref 14.0–49.7)
MCH: 27.9 pg (ref 25.1–34.0)
MCHC: 31.6 g/dL (ref 31.5–36.0)
MCV: 88.3 fL (ref 79.5–101.0)
MONO#: 1 10*3/uL — ABNORMAL HIGH (ref 0.1–0.9)
MONO%: 8.7 % (ref 0.0–14.0)
NEUT#: 7.2 10*3/uL — ABNORMAL HIGH (ref 1.5–6.5)
NEUT%: 66 % (ref 38.4–76.8)
Platelets: 286 10*3/uL (ref 145–400)
RBC: 4.7 10*6/uL (ref 3.70–5.45)
RDW: 16.3 % — AB (ref 11.2–14.5)
WBC: 11 10*3/uL — ABNORMAL HIGH (ref 3.9–10.3)
lymph#: 2.5 10*3/uL (ref 0.9–3.3)

## 2014-11-18 MED ORDER — GENTAMICIN SULFATE 40 MG/ML IJ SOLN
5.0000 mg/kg | INTRAVENOUS | Status: AC
  Administered 2014-11-19: 560 mg via INTRAVENOUS
  Filled 2014-11-18: qty 14

## 2014-11-18 MED ORDER — CLINDAMYCIN PHOSPHATE 900 MG/50ML IV SOLN: 900.0000 mg | INTRAVENOUS | Status: DC

## 2014-11-18 MED ORDER — TBO-FILGRASTIM 480 MCG/0.8ML ~~LOC~~ SOSY
480.0000 ug | PREFILLED_SYRINGE | SUBCUTANEOUS | Status: DC
  Filled 2014-11-18: qty 0.8

## 2014-11-18 MED ORDER — BUPIVACAINE 0.25 % ON-Q PUMP DUAL CATH 300 ML
300.0000 mL | INJECTION | Status: DC
  Filled 2014-11-18: qty 300

## 2014-11-18 MED ORDER — GENTAMICIN SULFATE 40 MG/ML IJ SOLN
INTRAMUSCULAR | Status: AC
  Administered 2014-11-19: 900 mL via INTRAPERITONEAL
  Filled 2014-11-18: qty 6

## 2014-11-18 NOTE — Anesthesia Preprocedure Evaluation (Addendum)
Anesthesia Evaluation  Patient identified by MRN, date of birth, ID band Patient awake    Reviewed: Allergy & Precautions, NPO status , Patient's Chart, lab work & pertinent test results, reviewed documented beta blocker date and time   Airway Mallampati: II   Neck ROM: Full    Dental  (+) Teeth Intact, Caps, Dental Advisory Given   Pulmonary  breath sounds clear to auscultation        Cardiovascular hypertension, Pt. on medications Rhythm:Regular  EKG 11/2014 without change, ST sbnotmality   Neuro/Psych Depression    GI/Hepatic hiatal hernia, GERD-  Medicated,  Endo/Other  diabetes, Type 2Hypothyroidism Morbid obesity  Renal/GU      Musculoskeletal   Abdominal (+) + obese,   Peds  Hematology 13/41, Leukopenia being followed and Rx'd   Anesthesia Other Findings   Reproductive/Obstetrics                           Anesthesia Physical Anesthesia Plan  ASA: III  Anesthesia Plan: General   Post-op Pain Management:    Induction: Intravenous  Airway Management Planned: Oral ETT and Video Laryngoscope Planned  Additional Equipment: Arterial line  Intra-op Plan:   Post-operative Plan: Extubation in OR  Informed Consent: I have reviewed the patients History and Physical, chart, labs and discussed the procedure including the risks, benefits and alternatives for the proposed anesthesia with the patient or authorized representative who has indicated his/her understanding and acceptance.     Plan Discussed with:   Anesthesia Plan Comments: (Glide in room, 2nd IV after induction, arterial line be nice for following labs, multimodal pain Rx)       Anesthesia Quick Evaluation

## 2014-11-19 ENCOUNTER — Inpatient Hospital Stay (HOSPITAL_COMMUNITY)
Admission: RE | Admit: 2014-11-19 | Discharge: 2014-11-25 | DRG: 746 | Disposition: A | Discharge status: Home Health | Payer: BLUE CROSS/BLUE SHIELD | Source: Ambulatory Visit | Attending: Gynecologic Oncology | Admitting: Gynecologic Oncology

## 2014-11-19 ENCOUNTER — Inpatient Hospital Stay (HOSPITAL_COMMUNITY): Payer: BLUE CROSS/BLUE SHIELD | Admitting: Anesthesiology

## 2014-11-19 ENCOUNTER — Encounter (HOSPITAL_COMMUNITY)
Admission: RE | Disposition: A | Discharge status: Home Health | Payer: Self-pay | Source: Ambulatory Visit | Attending: Gynecologic Oncology

## 2014-11-19 ENCOUNTER — Encounter (HOSPITAL_COMMUNITY): Payer: Self-pay | Admitting: Certified Registered Nurse Anesthetist

## 2014-11-19 DIAGNOSIS — Z882 Allergy status to sulfonamides status: Secondary | ICD-10-CM | POA: Diagnosis not present

## 2014-11-19 DIAGNOSIS — E872 Acidosis: Secondary | ICD-10-CM | POA: Diagnosis present

## 2014-11-19 DIAGNOSIS — Z880 Allergy status to penicillin: Secondary | ICD-10-CM

## 2014-11-19 DIAGNOSIS — D709 Neutropenia, unspecified: Secondary | ICD-10-CM | POA: Diagnosis present

## 2014-11-19 DIAGNOSIS — R310 Gross hematuria: Secondary | ICD-10-CM | POA: Diagnosis not present

## 2014-11-19 DIAGNOSIS — R1900 Intra-abdominal and pelvic swelling, mass and lump, unspecified site: Secondary | ICD-10-CM | POA: Diagnosis present

## 2014-11-19 DIAGNOSIS — I809 Phlebitis and thrombophlebitis of unspecified site: Secondary | ICD-10-CM | POA: Diagnosis not present

## 2014-11-19 DIAGNOSIS — J9601 Acute respiratory failure with hypoxia: Secondary | ICD-10-CM | POA: Diagnosis not present

## 2014-11-19 DIAGNOSIS — Z8543 Personal history of malignant neoplasm of ovary: Secondary | ICD-10-CM

## 2014-11-19 DIAGNOSIS — I10 Essential (primary) hypertension: Secondary | ICD-10-CM | POA: Diagnosis present

## 2014-11-19 DIAGNOSIS — M2560 Stiffness of unspecified joint, not elsewhere classified: Secondary | ICD-10-CM | POA: Diagnosis present

## 2014-11-19 DIAGNOSIS — Z888 Allergy status to other drugs, medicaments and biological substances status: Secondary | ICD-10-CM

## 2014-11-19 DIAGNOSIS — Z789 Other specified health status: Secondary | ICD-10-CM | POA: Diagnosis not present

## 2014-11-19 DIAGNOSIS — K219 Gastro-esophageal reflux disease without esophagitis: Secondary | ICD-10-CM | POA: Diagnosis present

## 2014-11-19 DIAGNOSIS — Z833 Family history of diabetes mellitus: Secondary | ICD-10-CM | POA: Diagnosis not present

## 2014-11-19 DIAGNOSIS — Z9109 Other allergy status, other than to drugs and biological substances: Secondary | ICD-10-CM

## 2014-11-19 DIAGNOSIS — L0390 Cellulitis, unspecified: Secondary | ICD-10-CM | POA: Diagnosis not present

## 2014-11-19 DIAGNOSIS — G9341 Metabolic encephalopathy: Secondary | ICD-10-CM | POA: Diagnosis not present

## 2014-11-19 DIAGNOSIS — D630 Anemia in neoplastic disease: Secondary | ICD-10-CM | POA: Diagnosis present

## 2014-11-19 DIAGNOSIS — Z881 Allergy status to other antibiotic agents status: Secondary | ICD-10-CM

## 2014-11-19 DIAGNOSIS — Z9221 Personal history of antineoplastic chemotherapy: Secondary | ICD-10-CM

## 2014-11-19 DIAGNOSIS — Z90710 Acquired absence of both cervix and uterus: Secondary | ICD-10-CM

## 2014-11-19 DIAGNOSIS — Z86010 Personal history of colonic polyps: Secondary | ICD-10-CM | POA: Diagnosis not present

## 2014-11-19 DIAGNOSIS — R21 Rash and other nonspecific skin eruption: Secondary | ICD-10-CM | POA: Diagnosis present

## 2014-11-19 DIAGNOSIS — K6289 Other specified diseases of anus and rectum: Secondary | ICD-10-CM | POA: Diagnosis present

## 2014-11-19 DIAGNOSIS — Z8249 Family history of ischemic heart disease and other diseases of the circulatory system: Secondary | ICD-10-CM | POA: Diagnosis not present

## 2014-11-19 DIAGNOSIS — Z800 Family history of malignant neoplasm of digestive organs: Secondary | ICD-10-CM

## 2014-11-19 DIAGNOSIS — D62 Acute posthemorrhagic anemia: Secondary | ICD-10-CM | POA: Diagnosis not present

## 2014-11-19 DIAGNOSIS — C569 Malignant neoplasm of unspecified ovary: Secondary | ICD-10-CM | POA: Diagnosis present

## 2014-11-19 DIAGNOSIS — K567 Ileus, unspecified: Secondary | ICD-10-CM | POA: Diagnosis not present

## 2014-11-19 DIAGNOSIS — G4733 Obstructive sleep apnea (adult) (pediatric): Secondary | ICD-10-CM | POA: Diagnosis present

## 2014-11-19 DIAGNOSIS — C541 Malignant neoplasm of endometrium: Secondary | ICD-10-CM | POA: Diagnosis present

## 2014-11-19 DIAGNOSIS — D696 Thrombocytopenia, unspecified: Secondary | ICD-10-CM | POA: Diagnosis present

## 2014-11-19 DIAGNOSIS — Z978 Presence of other specified devices: Secondary | ICD-10-CM | POA: Insufficient documentation

## 2014-11-19 DIAGNOSIS — K660 Peritoneal adhesions (postprocedural) (postinfection): Secondary | ICD-10-CM | POA: Diagnosis present

## 2014-11-19 DIAGNOSIS — I959 Hypotension, unspecified: Secondary | ICD-10-CM | POA: Diagnosis not present

## 2014-11-19 DIAGNOSIS — Z79899 Other long term (current) drug therapy: Secondary | ICD-10-CM | POA: Diagnosis not present

## 2014-11-19 DIAGNOSIS — F329 Major depressive disorder, single episode, unspecified: Secondary | ICD-10-CM | POA: Diagnosis present

## 2014-11-19 DIAGNOSIS — E039 Hypothyroidism, unspecified: Secondary | ICD-10-CM | POA: Diagnosis present

## 2014-11-19 DIAGNOSIS — Z6841 Body Mass Index (BMI) 40.0 and over, adult: Secondary | ICD-10-CM | POA: Diagnosis not present

## 2014-11-19 DIAGNOSIS — D72829 Elevated white blood cell count, unspecified: Secondary | ICD-10-CM | POA: Diagnosis not present

## 2014-11-19 DIAGNOSIS — E119 Type 2 diabetes mellitus without complications: Secondary | ICD-10-CM | POA: Diagnosis present

## 2014-11-19 DIAGNOSIS — E876 Hypokalemia: Secondary | ICD-10-CM | POA: Diagnosis not present

## 2014-11-19 DIAGNOSIS — D708 Other neutropenia: Secondary | ICD-10-CM | POA: Diagnosis not present

## 2014-11-19 HISTORY — PX: ROBOTIC ASSISTED LAP VAGINAL HYSTERECTOMY: SHX2362

## 2014-11-19 HISTORY — PX: OSTOMY: SHX5997

## 2014-11-19 LAB — BLOOD GAS, ARTERIAL
Acid-base deficit: 4.1 mmol/L — ABNORMAL HIGH (ref 0.0–2.0)
Acid-base deficit: 4.2 mmol/L — ABNORMAL HIGH (ref 0.0–2.0)
BICARBONATE: 21 meq/L (ref 20.0–24.0)
Bicarbonate: 21.6 mEq/L (ref 20.0–24.0)
Drawn by: 31814
Drawn by: 31814
FIO2: 1 %
FIO2: 0.4 %
MECHVT: 600 mL
O2 SAT: 98.9 %
O2 Saturation: 99.8 %
PATIENT TEMPERATURE: 98
PEEP/CPAP: 5 cmH2O
PEEP: 5 cmH2O
PH ART: 7.331 — AB (ref 7.350–7.450)
PH ART: 7.308 — AB (ref 7.350–7.450)
PO2 ART: 464 mmHg — AB (ref 80.0–100.0)
Patient temperature: 98.3
RATE: 12 resp/min
RATE: 12 resp/min
TCO2: 19.3 mmol/L (ref 0–100)
TCO2: 20 mmol/L (ref 0–100)
VT: 450 mL
pCO2 arterial: 40.8 mmHg (ref 35.0–45.0)
pCO2 arterial: 44.3 mmHg (ref 35.0–45.0)
pO2, Arterial: 162 mmHg — ABNORMAL HIGH (ref 80.0–100.0)

## 2014-11-19 LAB — POCT I-STAT 7, (LYTES, BLD GAS, ICA,H+H)
ACID-BASE DEFICIT: 1 mmol/L (ref 0.0–2.0)
ACID-BASE DEFICIT: 3 mmol/L — AB (ref 0.0–2.0)
Acid-base deficit: 1 mmol/L (ref 0.0–2.0)
Acid-base deficit: 3 mmol/L — ABNORMAL HIGH (ref 0.0–2.0)
Acid-base deficit: 3 mmol/L — ABNORMAL HIGH (ref 0.0–2.0)
Acid-base deficit: 3 mmol/L — ABNORMAL HIGH (ref 0.0–2.0)
BICARBONATE: 23.2 meq/L (ref 20.0–24.0)
BICARBONATE: 24.6 meq/L — AB (ref 20.0–24.0)
BICARBONATE: 22.4 meq/L (ref 20.0–24.0)
BICARBONATE: 22.3 meq/L (ref 20.0–24.0)
Bicarbonate: 20.8 mEq/L (ref 20.0–24.0)
Bicarbonate: 22.5 mEq/L (ref 20.0–24.0)
CALCIUM ION: 1.04 mmol/L — AB (ref 1.13–1.30)
Calcium, Ion: 1.07 mmol/L — ABNORMAL LOW (ref 1.13–1.30)
Calcium, Ion: 1.12 mmol/L — ABNORMAL LOW (ref 1.13–1.30)
Calcium, Ion: 1.04 mmol/L — ABNORMAL LOW (ref 1.13–1.30)
Calcium, Ion: 1.09 mmol/L — ABNORMAL LOW (ref 1.13–1.30)
Calcium, Ion: 1.03 mmol/L — ABNORMAL LOW (ref 1.13–1.30)
HCT: 34 % — ABNORMAL LOW (ref 36.0–46.0)
HCT: 38 % (ref 36.0–46.0)
HCT: 35 % — ABNORMAL LOW (ref 36.0–46.0)
HCT: 35 % — ABNORMAL LOW (ref 36.0–46.0)
HEMATOCRIT: 32 % — AB (ref 36.0–46.0)
HEMATOCRIT: 32 % — AB (ref 36.0–46.0)
HEMOGLOBIN: 12.9 g/dL (ref 12.0–15.0)
HEMOGLOBIN: 11.9 g/dL — AB (ref 12.0–15.0)
Hemoglobin: 11.6 g/dL — ABNORMAL LOW (ref 12.0–15.0)
Hemoglobin: 10.9 g/dL — ABNORMAL LOW (ref 12.0–15.0)
Hemoglobin: 10.9 g/dL — ABNORMAL LOW (ref 12.0–15.0)
Hemoglobin: 11.9 g/dL — ABNORMAL LOW (ref 12.0–15.0)
O2 SAT: 100 %
O2 SAT: 100 %
O2 Saturation: 99 %
O2 Saturation: 97 %
O2 Saturation: 98 %
O2 Saturation: 100 %
PCO2 ART: 38.3 mmHg (ref 35.0–45.0)
PCO2 ART: 38.8 mmHg (ref 35.0–45.0)
PCO2 ART: 40.7 mmHg (ref 35.0–45.0)
PH ART: 7.369 (ref 7.350–7.450)
PH ART: 7.358 (ref 7.350–7.450)
PO2 ART: 104 mmHg — AB (ref 80.0–100.0)
POTASSIUM: 3 mmol/L — AB (ref 3.5–5.1)
POTASSIUM: 3.1 mmol/L — AB (ref 3.5–5.1)
POTASSIUM: 3.6 mmol/L (ref 3.5–5.1)
Potassium: 3.7 mmol/L (ref 3.5–5.1)
Potassium: 4 mmol/L (ref 3.5–5.1)
Potassium: 4.2 mmol/L (ref 3.5–5.1)
SODIUM: 136 mmol/L (ref 135–145)
SODIUM: 136 mmol/L (ref 135–145)
Sodium: 138 mmol/L (ref 135–145)
Sodium: 136 mmol/L (ref 135–145)
Sodium: 136 mmol/L (ref 135–145)
Sodium: 136 mmol/L (ref 135–145)
TCO2: 24 mmol/L (ref 0–100)
TCO2: 26 mmol/L (ref 0–100)
TCO2: 22 mmol/L (ref 0–100)
TCO2: 24 mmol/L (ref 0–100)
TCO2: 24 mmol/L (ref 0–100)
TCO2: 23 mmol/L (ref 0–100)
pCO2 arterial: 41.5 mmHg (ref 35.0–45.0)
pCO2 arterial: 33.7 mmHg — ABNORMAL LOW (ref 35.0–45.0)
pCO2 arterial: 39.7 mmHg (ref 35.0–45.0)
pH, Arterial: 7.391 (ref 7.350–7.450)
pH, Arterial: 7.38 (ref 7.350–7.450)
pH, Arterial: 7.398 (ref 7.350–7.450)
pH, Arterial: 7.351 (ref 7.350–7.450)
pO2, Arterial: 319 mmHg — ABNORMAL HIGH (ref 80.0–100.0)
pO2, Arterial: 148 mmHg — ABNORMAL HIGH (ref 80.0–100.0)
pO2, Arterial: 85 mmHg (ref 80.0–100.0)
pO2, Arterial: 391 mmHg — ABNORMAL HIGH (ref 80.0–100.0)
pO2, Arterial: 375 mmHg — ABNORMAL HIGH (ref 80.0–100.0)

## 2014-11-19 LAB — CBC
HEMATOCRIT: 36 % (ref 36.0–46.0)
Hemoglobin: 11.6 g/dL — ABNORMAL LOW (ref 12.0–15.0)
MCH: 27.6 pg (ref 26.0–34.0)
MCHC: 32.2 g/dL (ref 30.0–36.0)
MCV: 85.5 fL (ref 78.0–100.0)
Platelets: 246 10*3/uL (ref 150–400)
RBC: 4.21 MIL/uL (ref 3.87–5.11)
RDW: 16 % — ABNORMAL HIGH (ref 11.5–15.5)
WBC: 40 10*3/uL — ABNORMAL HIGH (ref 4.0–10.5)

## 2014-11-19 LAB — BASIC METABOLIC PANEL
Anion gap: 9 (ref 5–15)
BUN: 17 mg/dL (ref 6–23)
CHLORIDE: 107 mmol/L (ref 96–112)
CO2: 22 mmol/L (ref 19–32)
Calcium: 7.9 mg/dL — ABNORMAL LOW (ref 8.4–10.5)
Creatinine, Ser: 0.71 mg/dL (ref 0.50–1.10)
GFR calc non Af Amer: >90 mL/min (ref >90)
Glucose, Bld: 173 mg/dL — ABNORMAL HIGH (ref 70–99)
Potassium: 4.1 mmol/L (ref 3.5–5.1)
Sodium: 138 mmol/L (ref 135–145)

## 2014-11-19 LAB — GLUCOSE, CAPILLARY: Glucose-Capillary: 129 mg/dL — ABNORMAL HIGH (ref 70–99)

## 2014-11-19 LAB — ABO/RH: ABO/RH(D): A POS

## 2014-11-19 LAB — MRSA PCR SCREENING: MRSA BY PCR: NEGATIVE

## 2014-11-19 LAB — TRIGLYCERIDES: Triglycerides: 60 mg/dL (ref <150)

## 2014-11-19 SURGERY — ROBOTIC ASSISTED LAPAROSCOPIC VAGINAL HYSTERECTOMY
Anesthesia: General | Site: Abdomen

## 2014-11-19 MED ORDER — FENTANYL CITRATE (PF) 100 MCG/2ML IJ SOLN: 25.0000 ug | INTRAMUSCULAR | Status: DC | PRN

## 2014-11-19 MED ORDER — PROPOFOL 10 MG/ML IV BOLUS
INTRAVENOUS | Status: AC
  Filled 2014-11-19: qty 20

## 2014-11-19 MED ORDER — ESMOLOL HCL 10 MG/ML IV SOLN
INTRAVENOUS | Status: DC | PRN
  Administered 2014-11-19: 10 mg via INTRAVENOUS

## 2014-11-19 MED ORDER — ONDANSETRON HCL 4 MG/2ML IJ SOLN
4.0000 mg | Freq: Four times a day (QID) | INTRAMUSCULAR | Status: DC | PRN
  Administered 2014-11-19: 4 mg via INTRAVENOUS
  Filled 2014-11-19: qty 2

## 2014-11-19 MED ORDER — PROPOFOL 10 MG/ML IV BOLUS
INTRAVENOUS | Status: DC | PRN
  Administered 2014-11-19: 50 mg via INTRAVENOUS
  Administered 2014-11-19: 200 mg via INTRAVENOUS
  Administered 2014-11-19: 50 mg via INTRAVENOUS

## 2014-11-19 MED ORDER — LIP MEDEX EX OINT
1.0000 "application " | TOPICAL_OINTMENT | Freq: Two times a day (BID) | CUTANEOUS | Status: DC
  Administered 2014-11-19 – 2014-11-25 (×12): 1 via TOPICAL
  Filled 2014-11-19 (×3): qty 7

## 2014-11-19 MED ORDER — CLINDAMYCIN PHOSPHATE 900 MG/50ML IV SOLN
INTRAVENOUS | Status: AC
  Filled 2014-11-19: qty 50

## 2014-11-19 MED ORDER — DEXAMETHASONE SODIUM PHOSPHATE 10 MG/ML IJ SOLN
INTRAMUSCULAR | Status: AC
  Filled 2014-11-19: qty 1

## 2014-11-19 MED ORDER — ALBUMIN HUMAN 5 % IV SOLN
INTRAVENOUS | Status: AC
  Filled 2014-11-19: qty 250

## 2014-11-19 MED ORDER — STERILE WATER FOR IRRIGATION IR SOLN
Status: DC | PRN
  Administered 2014-11-19: 1500 mL

## 2014-11-19 MED ORDER — ALUM & MAG HYDROXIDE-SIMETH 200-200-20 MG/5ML PO SUSP: 30.0000 mL | Freq: Four times a day (QID) | ORAL | Status: DC | PRN

## 2014-11-19 MED ORDER — PANTOPRAZOLE SODIUM 40 MG PO TBEC: 40.0000 mg | DELAYED_RELEASE_TABLET | Freq: Every day | ORAL | Status: DC

## 2014-11-19 MED ORDER — PROPOFOL 1000 MG/100ML IV EMUL
5.0000 ug/kg/min | INTRAVENOUS | Status: DC
  Administered 2014-11-19: 10 ug/kg/min via INTRAVENOUS
  Administered 2014-11-20: 30 ug/kg/min via INTRAVENOUS

## 2014-11-19 MED ORDER — FENTANYL CITRATE (PF) 250 MCG/5ML IJ SOLN
INTRAMUSCULAR | Status: AC
  Filled 2014-11-19: qty 5

## 2014-11-19 MED ORDER — CHLORHEXIDINE GLUCONATE 0.12 % MT SOLN
15.0000 mL | Freq: Two times a day (BID) | OROMUCOSAL | Status: DC
  Administered 2014-11-19 – 2014-11-22 (×6): 15 mL via OROMUCOSAL
  Filled 2014-11-19 (×15): qty 15

## 2014-11-19 MED ORDER — ONDANSETRON HCL 4 MG PO TABS
4.0000 mg | ORAL_TABLET | Freq: Four times a day (QID) | ORAL | Status: DC | PRN
  Administered 2014-11-22: 4 mg via ORAL
  Filled 2014-11-19: qty 1

## 2014-11-19 MED ORDER — LIDOCAINE HCL (CARDIAC) 20 MG/ML IV SOLN
INTRAVENOUS | Status: DC | PRN
  Administered 2014-11-19: 75 mg via INTRAVENOUS

## 2014-11-19 MED ORDER — KETOROLAC TROMETHAMINE 15 MG/ML IJ SOLN: 15.0000 mg | Freq: Four times a day (QID) | INTRAMUSCULAR | Status: DC

## 2014-11-19 MED ORDER — DEXMEDETOMIDINE BOLUS VIA INFUSION
INTRAVENOUS | Status: DC | PRN
  Administered 2014-11-19: 12 ug via INTRAVENOUS
  Administered 2014-11-19 (×5): 10 ug via INTRAVENOUS

## 2014-11-19 MED ORDER — SODIUM CHLORIDE 0.9 % IV SOLN
INTRAVENOUS | Status: DC
  Administered 2014-11-19: 100 mL/h via INTRAVENOUS
  Administered 2014-11-21: 03:00:00 via INTRAVENOUS

## 2014-11-19 MED ORDER — METHYLENE BLUE 1 % INJ SOLN
INTRAMUSCULAR | Status: AC
  Filled 2014-11-19: qty 10

## 2014-11-19 MED ORDER — CETYLPYRIDINIUM CHLORIDE 0.05 % MT LIQD
7.0000 mL | Freq: Four times a day (QID) | OROMUCOSAL | Status: DC
  Administered 2014-11-20 – 2014-11-21 (×5): 7 mL via OROMUCOSAL

## 2014-11-19 MED ORDER — ALVIMOPAN 12 MG PO CAPS
12.0000 mg | ORAL_CAPSULE | Freq: Once | ORAL | Status: AC
  Administered 2014-11-19: 12 mg via ORAL
  Filled 2014-11-19: qty 1

## 2014-11-19 MED ORDER — CISATRACURIUM BESYLATE (PF) 10 MG/5ML IV SOLN
INTRAVENOUS | Status: DC | PRN
  Administered 2014-11-19: 4 mg via INTRAVENOUS
  Administered 2014-11-19: 6 mg via INTRAVENOUS
  Administered 2014-11-19: 8 mg via INTRAVENOUS
  Administered 2014-11-19: 10 mg via INTRAVENOUS
  Administered 2014-11-19 (×2): 4 mg via INTRAVENOUS
  Administered 2014-11-19: 6 mg via INTRAVENOUS

## 2014-11-19 MED ORDER — LIDOCAINE HCL (CARDIAC) 20 MG/ML IV SOLN
INTRAVENOUS | Status: AC
  Filled 2014-11-19: qty 5

## 2014-11-19 MED ORDER — INSULIN ASPART 100 UNIT/ML ~~LOC~~ SOLN
2.0000 [IU] | SUBCUTANEOUS | Status: DC
  Administered 2014-11-20 (×2): 4 [IU] via SUBCUTANEOUS

## 2014-11-19 MED ORDER — ALVIMOPAN 12 MG PO CAPS
12.0000 mg | ORAL_CAPSULE | Freq: Two times a day (BID) | ORAL | Status: DC
  Administered 2014-11-20 – 2014-11-24 (×9): 12 mg via ORAL
  Filled 2014-11-19 (×10): qty 1

## 2014-11-19 MED ORDER — PHENOL 1.4 % MT LIQD: 2.0000 | OROMUCOSAL | Status: DC | PRN

## 2014-11-19 MED ORDER — DIPHENHYDRAMINE HCL 12.5 MG/5ML PO ELIX: 12.5000 mg | ORAL_SOLUTION | Freq: Four times a day (QID) | ORAL | Status: DC | PRN

## 2014-11-19 MED ORDER — METHYLENE BLUE 1 % INJ SOLN
INTRAMUSCULAR | Status: DC | PRN
  Administered 2014-11-19: 5 mL via INTRAVENOUS

## 2014-11-19 MED ORDER — FENTANYL BOLUS VIA INFUSION
50.0000 ug | INTRAVENOUS | Status: DC | PRN
  Filled 2014-11-19: qty 50

## 2014-11-19 MED ORDER — HEPARIN SODIUM (PORCINE) 5000 UNIT/ML IJ SOLN
5000.0000 [IU] | Freq: Three times a day (TID) | INTRAMUSCULAR | Status: DC
  Administered 2014-11-20 – 2014-11-25 (×17): 5000 [IU] via SUBCUTANEOUS
  Filled 2014-11-19 (×19): qty 1

## 2014-11-19 MED ORDER — METHOCARBAMOL 1000 MG/10ML IJ SOLN
1000.0000 mg | Freq: Four times a day (QID) | INTRAVENOUS | Status: DC | PRN
  Filled 2014-11-19: qty 10

## 2014-11-19 MED ORDER — CLINDAMYCIN PHOSPHATE 900 MG/50ML IV SOLN
900.0000 mg | Freq: Three times a day (TID) | INTRAVENOUS | Status: AC
  Administered 2014-11-19: 900 mg via INTRAVENOUS
  Filled 2014-11-19: qty 50

## 2014-11-19 MED ORDER — CHLORHEXIDINE GLUCONATE 4 % EX LIQD: 60.0000 mL | Freq: Once | CUTANEOUS | Status: DC

## 2014-11-19 MED ORDER — MEPERIDINE HCL 25 MG/ML IJ SOLN
6.2500 mg | INTRAMUSCULAR | Status: DC | PRN
Start: 2014-11-19 — End: 2014-11-19

## 2014-11-19 MED ORDER — ALBUMIN HUMAN 5 % IV SOLN
12.5000 g | Freq: Once | INTRAVENOUS | Status: DC
  Filled 2014-11-19: qty 250

## 2014-11-19 MED ORDER — POTASSIUM CHLORIDE 10 MEQ/100ML IV SOLN
10.0000 meq | INTRAVENOUS | Status: AC
  Administered 2014-11-19: 10 meq via INTRAVENOUS
  Filled 2014-11-19 (×2): qty 100

## 2014-11-19 MED ORDER — LACTATED RINGERS IV SOLN
INTRAVENOUS | Status: DC | PRN
  Administered 2014-11-19 (×2): via INTRAVENOUS

## 2014-11-19 MED ORDER — MIDAZOLAM HCL 2 MG/2ML IJ SOLN
INTRAMUSCULAR | Status: AC
  Filled 2014-11-19: qty 2

## 2014-11-19 MED ORDER — PROMETHAZINE HCL 25 MG/ML IJ SOLN: 6.2500 mg | INTRAMUSCULAR | Status: DC | PRN

## 2014-11-19 MED ORDER — ALBUTEROL SULFATE (2.5 MG/3ML) 0.083% IN NEBU: 3.0000 mL | INHALATION_SOLUTION | Freq: Four times a day (QID) | RESPIRATORY_TRACT | Status: DC | PRN

## 2014-11-19 MED ORDER — PHENYLEPHRINE HCL 10 MG/ML IJ SOLN
INTRAMUSCULAR | Status: DC | PRN
  Administered 2014-11-19: 80 ug via INTRAVENOUS
  Administered 2014-11-19: 40 ug via INTRAVENOUS

## 2014-11-19 MED ORDER — INSULIN ASPART 100 UNIT/ML ~~LOC~~ SOLN
0.0000 [IU] | SUBCUTANEOUS | Status: DC
  Administered 2014-11-19: 3 [IU] via SUBCUTANEOUS

## 2014-11-19 MED ORDER — LACTATED RINGERS IV SOLN
INTRAVENOUS | Status: DC
Start: 2014-11-19 — End: 2014-11-19
  Administered 2014-11-19: 1000 mL via INTRAVENOUS

## 2014-11-19 MED ORDER — SUCCINYLCHOLINE CHLORIDE 20 MG/ML IJ SOLN
INTRAMUSCULAR | Status: DC | PRN
  Administered 2014-11-19: 100 mg via INTRAVENOUS

## 2014-11-19 MED ORDER — SODIUM CHLORIDE 0.9 % IV SOLN
INTRAVENOUS | Status: DC | PRN
  Administered 2014-11-19: 16:00:00 via INTRAVENOUS

## 2014-11-19 MED ORDER — LEVOTHYROXINE SODIUM 175 MCG PO TABS
175.0000 ug | ORAL_TABLET | Freq: Every day | ORAL | Status: DC
  Administered 2014-11-20 – 2014-11-25 (×6): 175 ug via ORAL
  Filled 2014-11-19: qty 2
  Filled 2014-11-19 (×3): qty 1
  Filled 2014-11-19: qty 2
  Filled 2014-11-19 (×4): qty 1

## 2014-11-19 MED ORDER — INSULIN ASPART 100 UNIT/ML ~~LOC~~ SOLN: 0.0000 [IU] | Freq: Three times a day (TID) | SUBCUTANEOUS | Status: DC

## 2014-11-19 MED ORDER — MAGIC MOUTHWASH
15.0000 mL | Freq: Four times a day (QID) | ORAL | Status: DC | PRN
  Filled 2014-11-19: qty 15

## 2014-11-19 MED ORDER — BUPIVACAINE LIPOSOME 1.3 % IJ SUSP
20.0000 mL | Freq: Once | INTRAMUSCULAR | Status: AC
  Administered 2014-11-19: 20 mL
  Filled 2014-11-19: qty 20

## 2014-11-19 MED ORDER — SODIUM CHLORIDE 0.9 % IJ SOLN
INTRAMUSCULAR | Status: AC
  Filled 2014-11-19: qty 20

## 2014-11-19 MED ORDER — MENTHOL 3 MG MT LOZG
1.0000 | LOZENGE | OROMUCOSAL | Status: DC | PRN
  Filled 2014-11-19: qty 9

## 2014-11-19 MED ORDER — LACTATED RINGERS IV SOLN
INTRAVENOUS | Status: DC | PRN
  Administered 2014-11-19 (×4): via INTRAVENOUS

## 2014-11-19 MED ORDER — LABETALOL HCL 5 MG/ML IV SOLN
INTRAVENOUS | Status: DC | PRN
  Administered 2014-11-19: 6 mg via INTRAVENOUS

## 2014-11-19 MED ORDER — BUPIVACAINE-EPINEPHRINE (PF) 0.25% -1:200000 IJ SOLN
INTRAMUSCULAR | Status: AC
  Filled 2014-11-19: qty 60

## 2014-11-19 MED ORDER — PHENYLEPHRINE HCL 10 MG/ML IJ SOLN
20.0000 mg | INTRAMUSCULAR | Status: DC | PRN
  Administered 2014-11-19: 30 ug/min via INTRAVENOUS

## 2014-11-19 MED ORDER — ROSUVASTATIN CALCIUM 10 MG PO TABS
10.0000 mg | ORAL_TABLET | Freq: Every evening | ORAL | Status: DC
  Administered 2014-11-20 – 2014-11-24 (×5): 10 mg via ORAL
  Filled 2014-11-19 (×6): qty 1

## 2014-11-19 MED ORDER — PANTOPRAZOLE SODIUM 40 MG IV SOLR
40.0000 mg | Freq: Two times a day (BID) | INTRAVENOUS | Status: DC
  Administered 2014-11-19 – 2014-11-20 (×3): 40 mg via INTRAVENOUS
  Filled 2014-11-19 (×3): qty 40

## 2014-11-19 MED ORDER — CLINDAMYCIN PHOSPHATE 600 MG/50ML IV SOLN
INTRAVENOUS | Status: DC | PRN
  Administered 2014-11-19: 900 mg via INTRAVENOUS

## 2014-11-19 MED ORDER — DEXMEDETOMIDINE HCL IN NACL 200 MCG/50ML IV SOLN
0.4000 ug/kg/h | INTRAVENOUS | Status: DC
  Filled 2014-11-19 (×2): qty 50

## 2014-11-19 MED ORDER — PHENYLEPHRINE HCL 10 MG/ML IJ SOLN
30.0000 ug/min | INTRAVENOUS | Status: DC
  Administered 2014-11-19: 60 ug/min via INTRAVENOUS
  Administered 2014-11-20: 80 ug/min via INTRAVENOUS
  Administered 2014-11-20: 100 ug/min via INTRAVENOUS
  Administered 2014-11-20: 120 ug/min via INTRAVENOUS
  Filled 2014-11-19 (×5): qty 1

## 2014-11-19 MED ORDER — CISATRACURIUM BESYLATE 20 MG/10ML IV SOLN
INTRAVENOUS | Status: AC
  Filled 2014-11-19: qty 10

## 2014-11-19 MED ORDER — FENTANYL CITRATE (PF) 100 MCG/2ML IJ SOLN
50.0000 ug | Freq: Once | INTRAMUSCULAR | Status: AC
  Administered 2014-11-19: 50 ug via INTRAVENOUS
  Filled 2014-11-19: qty 2

## 2014-11-19 MED ORDER — SODIUM CHLORIDE 0.9 % IV SOLN: INTRAVENOUS | Status: DC

## 2014-11-19 MED ORDER — SODIUM CHLORIDE 0.9 % IV SOLN
25.0000 ug/h | INTRAVENOUS | Status: DC
  Administered 2014-11-19: 25 ug/h via INTRAVENOUS
  Filled 2014-11-19 (×2): qty 50

## 2014-11-19 MED ORDER — ONDANSETRON HCL 4 MG/2ML IJ SOLN
INTRAMUSCULAR | Status: DC | PRN
  Administered 2014-11-19: 4 mg via INTRAVENOUS

## 2014-11-19 MED ORDER — LACTATED RINGERS IV SOLN
INTRAVENOUS | Status: DC
  Administered 2014-11-19: 1000 mL via INTRAVENOUS

## 2014-11-19 MED ORDER — ADULT MULTIVITAMIN W/MINERALS CH
1.0000 | ORAL_TABLET | Freq: Every day | ORAL | Status: DC
  Administered 2014-11-20 – 2014-11-25 (×6): 1 via ORAL
  Filled 2014-11-19 (×6): qty 1

## 2014-11-19 MED ORDER — TBO-FILGRASTIM 480 MCG/0.8ML ~~LOC~~ SOSY: 480.0000 ug | PREFILLED_SYRINGE | SUBCUTANEOUS | Status: DC

## 2014-11-19 MED ORDER — HEPARIN SODIUM (PORCINE) 5000 UNIT/ML IJ SOLN
5000.0000 [IU] | Freq: Once | INTRAMUSCULAR | Status: AC
  Administered 2014-11-19: 5000 [IU] via SUBCUTANEOUS
  Filled 2014-11-19: qty 1

## 2014-11-19 MED ORDER — MIDAZOLAM HCL 5 MG/5ML IJ SOLN
INTRAMUSCULAR | Status: DC | PRN
  Administered 2014-11-19 (×2): 1 mg via INTRAVENOUS

## 2014-11-19 MED ORDER — 0.9 % SODIUM CHLORIDE (POUR BTL) OPTIME
TOPICAL | Status: DC | PRN
  Administered 2014-11-19: 4000 mL

## 2014-11-19 MED ORDER — LACTATED RINGERS IR SOLN
Status: DC | PRN
  Administered 2014-11-19: 1000 mL

## 2014-11-19 MED ORDER — FENTANYL CITRATE (PF) 100 MCG/2ML IJ SOLN
INTRAMUSCULAR | Status: DC | PRN
  Administered 2014-11-19 (×3): 50 ug via INTRAVENOUS
  Administered 2014-11-19: 100 ug via INTRAVENOUS
  Administered 2014-11-19: 50 ug via INTRAVENOUS
  Administered 2014-11-19: 100 ug via INTRAVENOUS
  Administered 2014-11-19 (×2): 50 ug via INTRAVENOUS

## 2014-11-19 MED ORDER — LACTATED RINGERS IV BOLUS (SEPSIS): 1000.0000 mL | Freq: Three times a day (TID) | INTRAVENOUS | Status: AC | PRN

## 2014-11-19 MED ORDER — ONDANSETRON HCL 4 MG/2ML IJ SOLN
INTRAMUSCULAR | Status: AC
  Filled 2014-11-19: qty 2

## 2014-11-19 MED ORDER — HYDROMORPHONE HCL 1 MG/ML IJ SOLN
0.5000 mg | INTRAMUSCULAR | Status: DC | PRN
  Administered 2014-11-19: 1 mg via INTRAVENOUS
  Filled 2014-11-19: qty 1

## 2014-11-19 MED ORDER — ACETAMINOPHEN 500 MG PO TABS
1000.0000 mg | ORAL_TABLET | Freq: Three times a day (TID) | ORAL | Status: DC
  Administered 2014-11-20 – 2014-11-25 (×16): 1000 mg via ORAL
  Filled 2014-11-19 (×20): qty 2

## 2014-11-19 MED ORDER — DIPHENHYDRAMINE HCL 50 MG/ML IJ SOLN: 12.5000 mg | Freq: Four times a day (QID) | INTRAMUSCULAR | Status: DC | PRN

## 2014-11-19 MED ORDER — INSULIN ASPART 100 UNIT/ML ~~LOC~~ SOLN: 0.0000 [IU] | Freq: Every day | SUBCUTANEOUS | Status: DC

## 2014-11-19 MED ORDER — NEOSTIGMINE METHYLSULFATE 10 MG/10ML IV SOLN
INTRAVENOUS | Status: AC
  Filled 2014-11-19: qty 1

## 2014-11-19 MED ORDER — SACCHAROMYCES BOULARDII 250 MG PO CAPS
250.0000 mg | ORAL_CAPSULE | Freq: Two times a day (BID) | ORAL | Status: DC
  Administered 2014-11-20 – 2014-11-25 (×11): 250 mg via ORAL
  Filled 2014-11-19 (×12): qty 1

## 2014-11-19 MED ORDER — GLYCOPYRROLATE 0.2 MG/ML IJ SOLN
INTRAMUSCULAR | Status: AC
  Filled 2014-11-19: qty 4

## 2014-11-19 MED ORDER — ALBUMIN HUMAN 5 % IV SOLN
INTRAVENOUS | Status: DC | PRN
  Administered 2014-11-19 (×3): via INTRAVENOUS

## 2014-11-19 SURGICAL SUPPLY — 155 items
APPLIER CLIP 5 13 M/L LIGAMAX5 (MISCELLANEOUS)
APPLIER CLIP ROT 10 11.4 M/L (STAPLE) ×3
BLADE EXTENDED COATED 6.5IN (ELECTRODE) ×3 IMPLANT
BLADE HEX COATED 2.75 (ELECTRODE) ×3 IMPLANT
BLADE SURG SZ11 CARB STEEL (BLADE) IMPLANT
CABLE HIGH FREQUENCY MONO STRZ (ELECTRODE) ×3 IMPLANT
CANNULA REDUC XI 12-8 STAPL (CANNULA) ×1
CANNULA REDUCER 12-8 DVNC XI (CANNULA) ×2 IMPLANT
CATH KIT ON-Q SILVERSOAK 7.5IN (CATHETERS) IMPLANT
CELLS DAT CNTRL 66122 CELL SVR (MISCELLANEOUS) IMPLANT
CHLORAPREP W/TINT 26ML (MISCELLANEOUS) ×6 IMPLANT
CLIP APPLIE 5 13 M/L LIGAMAX5 (MISCELLANEOUS) IMPLANT
CLIP APPLIE ROT 10 11.4 M/L (STAPLE) ×2 IMPLANT
CLIP LIGATING HEM O LOK PURPLE (MISCELLANEOUS) ×3 IMPLANT
CLIP LIGATING HEMO O LOK GREEN (MISCELLANEOUS) IMPLANT
CLIP LIGATING HEMOLOK MED (MISCELLANEOUS) IMPLANT
CLIP TI LARGE 6 (CLIP) ×3 IMPLANT
CLIP TI MEDIUM 6 (CLIP) ×3 IMPLANT
CLIP TI MEDIUM LARGE 6 (CLIP) ×3 IMPLANT
CORDS BIPOLAR (ELECTRODE) IMPLANT
COVER SURGICAL LIGHT HANDLE (MISCELLANEOUS) IMPLANT
COVER TIP SHEARS 8 DVNC (MISCELLANEOUS) ×2 IMPLANT
COVER TIP SHEARS 8MM DA VINCI (MISCELLANEOUS) ×1
DECANTER SPIKE VIAL GLASS SM (MISCELLANEOUS) IMPLANT
DEVICE TROCAR PUNCTURE CLOSURE (ENDOMECHANICALS) IMPLANT
DRAIN CHANNEL 19F RND (DRAIN) ×3 IMPLANT
DRAPE ARM DVNC X/XI (DISPOSABLE) ×8 IMPLANT
DRAPE COLUMN DVNC XI (DISPOSABLE) ×2 IMPLANT
DRAPE DA VINCI XI ARM (DISPOSABLE) ×4
DRAPE DA VINCI XI COLUMN (DISPOSABLE) ×1
DRAPE SHEET LG 3/4 BI-LAMINATE (DRAPES) IMPLANT
DRAPE SURG IRRIG POUCH 19X23 (DRAPES) IMPLANT
DRAPE TABLE BACK 44X90 PK DISP (DRAPES) ×6 IMPLANT
DRAPE WARM FLUID 44X44 (DRAPE) IMPLANT
DRSG OPSITE POSTOP 4X10 (GAUZE/BANDAGES/DRESSINGS) IMPLANT
DRSG OPSITE POSTOP 4X12 (GAUZE/BANDAGES/DRESSINGS) ×3 IMPLANT
DRSG OPSITE POSTOP 4X6 (GAUZE/BANDAGES/DRESSINGS) IMPLANT
DRSG OPSITE POSTOP 4X8 (GAUZE/BANDAGES/DRESSINGS) IMPLANT
DRSG TEGADERM 2-3/8X2-3/4 SM (GAUZE/BANDAGES/DRESSINGS) IMPLANT
DRSG TEGADERM 4X4.75 (GAUZE/BANDAGES/DRESSINGS) ×3 IMPLANT
DRSG TEGADERM 6X8 (GAUZE/BANDAGES/DRESSINGS) IMPLANT
ELECT PENCIL ROCKER SW 15FT (MISCELLANEOUS) ×3 IMPLANT
ELECT REM PT RETURN 9FT ADLT (ELECTROSURGICAL) ×6
ELECTRODE REM PT RTRN 9FT ADLT (ELECTROSURGICAL) ×4 IMPLANT
ENDOLOOP SUT PDS II  0 18 (SUTURE)
ENDOLOOP SUT PDS II 0 18 (SUTURE) IMPLANT
EVACUATOR SILICONE 100CC (DRAIN) ×3 IMPLANT
GAUZE SPONGE 2X2 8PLY STRL LF (GAUZE/BANDAGES/DRESSINGS) ×2 IMPLANT
GAUZE SPONGE 4X4 12PLY STRL (GAUZE/BANDAGES/DRESSINGS) ×3 IMPLANT
GAUZE SPONGE 4X4 16PLY XRAY LF (GAUZE/BANDAGES/DRESSINGS) ×3 IMPLANT
GLOVE BIO SURGEON STRL SZ 6 (GLOVE) ×9 IMPLANT
GLOVE BIO SURGEON STRL SZ 6.5 (GLOVE) ×12 IMPLANT
GLOVE ECLIPSE 8.0 STRL XLNG CF (GLOVE) ×9 IMPLANT
GLOVE INDICATOR 8.0 STRL GRN (GLOVE) ×9 IMPLANT
GOWN STRL REUS W/ TWL LRG LVL3 (GOWN DISPOSABLE) ×6 IMPLANT
GOWN STRL REUS W/TWL LRG LVL3 (GOWN DISPOSABLE) ×3
GOWN STRL REUS W/TWL XL LVL3 (GOWN DISPOSABLE) ×12 IMPLANT
GUIDEWIRE STR DUAL SENSOR (WIRE) ×3 IMPLANT
HOLDER FOLEY CATH W/STRAP (MISCELLANEOUS) ×3 IMPLANT
KIT ACCESSORY DA VINCI DISP (KITS)
KIT ACCESSORY DVNC DISP (KITS) IMPLANT
KIT BASIN OR (CUSTOM PROCEDURE TRAY) IMPLANT
KIT PROCEDURE DA VINCI SI (MISCELLANEOUS)
KIT PROCEDURE DVNC SI (MISCELLANEOUS) IMPLANT
LEGGING LITHOTOMY PAIR STRL (DRAPES) IMPLANT
LIGASURE IMPACT 36 18CM CVD LR (INSTRUMENTS) ×3 IMPLANT
LIQUID BAND (GAUZE/BANDAGES/DRESSINGS) ×3 IMPLANT
LOOP VESSEL MAXI BLUE (MISCELLANEOUS) ×6 IMPLANT
LUBRICANT JELLY K Y 4OZ (MISCELLANEOUS) IMPLANT
MANIPULATOR UTERINE 4.5 ZUMI (MISCELLANEOUS) IMPLANT
NEEDLE INSUFFLATION 14GA 120MM (NEEDLE) IMPLANT
OCCLUDER COLPOPNEUMO (BALLOONS) ×3 IMPLANT
PACK CARDIOVASCULAR III (CUSTOM PROCEDURE TRAY) IMPLANT
PACK COLON (CUSTOM PROCEDURE TRAY) ×3 IMPLANT
PAD POSITIONING PINK XL (MISCELLANEOUS) ×3 IMPLANT
PEN SKIN MARKING BROAD (MISCELLANEOUS) ×6 IMPLANT
PORT LAP GEL ALEXIS MED 5-9CM (MISCELLANEOUS) IMPLANT
POUCH SPECIMEN RETRIEVAL 10MM (ENDOMECHANICALS) IMPLANT
RTRCTR WOUND ALEXIS 18CM MED (MISCELLANEOUS)
SCISSORS LAP 5X35 DISP (ENDOMECHANICALS) ×3 IMPLANT
SCISSORS LAP 5X45 EPIX DISP (ENDOMECHANICALS) IMPLANT
SCRUB PCMX 4 OZ (MISCELLANEOUS) IMPLANT
SEAL CANN UNIV 5-8 DVNC XI (MISCELLANEOUS) ×10 IMPLANT
SEAL XI 5MM-8MM UNIVERSAL (MISCELLANEOUS) ×5
SEALER VESSEL DA VINCI XI (MISCELLANEOUS)
SEALER VESSEL EXT DVNC XI (MISCELLANEOUS) IMPLANT
SET IRRIG TUBING LAPAROSCOPIC (IRRIGATION / IRRIGATOR) ×3 IMPLANT
SET TUBE IRRIG SUCTION NO TIP (IRRIGATION / IRRIGATOR) ×3 IMPLANT
SHEET LAVH (DRAPES) ×3 IMPLANT
SLEEVE SURGEON STRL (DRAPES) ×3 IMPLANT
SLEEVE XCEL OPT CAN 5 100 (ENDOMECHANICALS) IMPLANT
SOLUTION ELECTROLUBE (MISCELLANEOUS) ×3 IMPLANT
SPONGE GAUZE 2X2 STER 10/PKG (GAUZE/BANDAGES/DRESSINGS) ×1
SPONGE LAP 18X18 X RAY DECT (DISPOSABLE) ×6 IMPLANT
STAPLER 45 BLU RELOAD XI (STAPLE) IMPLANT
STAPLER 45 BLUE RELOAD XI (STAPLE)
STAPLER 45 GREEN RELOAD XI (STAPLE)
STAPLER 45 GRN RELOAD XI (STAPLE) IMPLANT
STAPLER CANNULA SEAL DVNC XI (STAPLE) IMPLANT
STAPLER CANNULA SEAL XI (STAPLE)
STAPLER CUT CVD 40MM BLUE (STAPLE) ×3 IMPLANT
STAPLER CUT RELOAD BLUE (STAPLE) ×3 IMPLANT
STAPLER SHEATH (SHEATH)
STAPLER SHEATH ENDOWRIST DVNC (SHEATH) IMPLANT
STAPLER VISISTAT 35W (STAPLE) ×3 IMPLANT
STENT PERCUFLEX 4.8FRX24 (STENTS) ×6 IMPLANT
STENT URET 6FRX24 CONTOUR (STENTS) ×6 IMPLANT
SUT ETHILON 2 0 PS N (SUTURE) ×3 IMPLANT
SUT MNCRL AB 3-0 PS2 18 (SUTURE) ×3 IMPLANT
SUT MNCRL AB 4-0 PS2 18 (SUTURE) ×3 IMPLANT
SUT PDS AB 1 CTX 36 (SUTURE) IMPLANT
SUT PDS AB 1 TP1 96 (SUTURE) ×3 IMPLANT
SUT PDS AB 2-0 CT2 27 (SUTURE) IMPLANT
SUT PROLENE 0 CT 2 (SUTURE) IMPLANT
SUT PROLENE 2 0 SH DA (SUTURE) IMPLANT
SUT SILK 2 0 (SUTURE)
SUT SILK 2 0 SH CR/8 (SUTURE) ×3 IMPLANT
SUT SILK 2-0 18XBRD TIE 12 (SUTURE) IMPLANT
SUT SILK 3 0 (SUTURE)
SUT SILK 3 0 SH CR/8 (SUTURE) IMPLANT
SUT SILK 3-0 18XBRD TIE 12 (SUTURE) IMPLANT
SUT V-LOC BARB 180 2/0GR6 GS22 (SUTURE)
SUT VIC AB 0 CT1 27 (SUTURE) ×2
SUT VIC AB 0 CT1 27XBRD ANTBC (SUTURE) ×4 IMPLANT
SUT VIC AB 2-0 SH 27 (SUTURE) ×2
SUT VIC AB 2-0 SH 27X BRD (SUTURE) ×4 IMPLANT
SUT VIC AB 3-0 SH 18 (SUTURE) ×6 IMPLANT
SUT VIC AB 3-0 SH 27 (SUTURE) ×7
SUT VIC AB 3-0 SH 27X BRD (SUTURE) ×4 IMPLANT
SUT VIC AB 3-0 SH 27XBRD (SUTURE) ×10 IMPLANT
SUT VIC AB 4-0 PS2 27 (SUTURE) IMPLANT
SUT VIC AB 4-0 SH 27 (SUTURE) ×1
SUT VIC AB 4-0 SH 27XBRD (SUTURE) ×2 IMPLANT
SUT VICRYL 0 UR6 27IN ABS (SUTURE) IMPLANT
SUT VLOC 180 2-0 9IN GS21 (SUTURE) IMPLANT
SUTURE V-LC BRB 180 2/0GR6GS22 (SUTURE) IMPLANT
SYR 50ML LL SCALE MARK (SYRINGE) ×3 IMPLANT
SYRINGE 10CC LL (SYRINGE) IMPLANT
SYS LAPSCP GELPORT 120MM (MISCELLANEOUS)
SYSTEM LAPSCP GELPORT 120MM (MISCELLANEOUS) IMPLANT
TAPE UMBILICAL COTTON 1/8X30 (MISCELLANEOUS) ×3 IMPLANT
TOWEL NATURAL 10PK STERILE (DISPOSABLE) ×3 IMPLANT
TOWEL OR 17X26 10 PK STRL BLUE (TOWEL DISPOSABLE) ×6 IMPLANT
TOWEL OR NON WOVEN STRL DISP B (DISPOSABLE) IMPLANT
TRAP SPECIMEN MUCOUS 40CC (MISCELLANEOUS) IMPLANT
TRAY FOLEY W/METER SILVER 14FR (SET/KITS/TRAYS/PACK) ×3 IMPLANT
TRAY LAPAROSCOPIC (CUSTOM PROCEDURE TRAY) IMPLANT
TROCAR 12M 150ML BLUNT (TROCAR) ×3 IMPLANT
TROCAR BLADELESS OPT 5 100 (ENDOMECHANICALS) ×3 IMPLANT
TROCAR XCEL 12X100 BLDLESS (ENDOMECHANICALS) ×3 IMPLANT
TUBING CONNECTING 10 (TUBING) IMPLANT
TUBING FILTER THERMOFLATOR (ELECTROSURGICAL) IMPLANT
TUBING INSUFFLATION 10FT LAP (TUBING) IMPLANT
TUNNELER SHEATH ON-Q 16GX12 DP (PAIN MANAGEMENT) IMPLANT
WATER STERILE IRR 1500ML POUR (IV SOLUTION) IMPLANT

## 2014-11-19 NOTE — Anesthesia Procedure Notes (Signed)
Procedure Name: Intubation Date/Time: 11/19/2014 10:18 AM Performed by: Ofilia Neas Pre-anesthesia Checklist: Patient identified, Timeout performed, Emergency Drugs available, Suction available and Patient being monitored Patient Re-evaluated:Patient Re-evaluated prior to inductionOxygen Delivery Method: Circle system utilized Preoxygenation: Pre-oxygenation with 100% oxygen Intubation Type: IV induction and Cricoid Pressure applied Ventilation: Mask ventilation without difficulty Laryngoscope Size: Mac and 3 Grade View: Grade II Tube type: Subglottic suction tube Tube size: 7.5 mm Number of attempts: 1 Airway Equipment and Method: Stylet Placement Confirmation: ETT inserted through vocal cords under direct vision,  positive ETCO2 and breath sounds checked- equal and bilateral Secured at: 21 cm Tube secured with: Tape Dental Injury: Teeth and Oropharynx as per pre-operative assessment  Difficulty Due To: Difficulty was anticipated, Difficult Airway- due to reduced neck mobility, Difficult Airway- due to large tongue and Difficult Airway- due to anterior larynx Comments: Intubation by Dr. Tresa Moore

## 2014-11-19 NOTE — Progress Notes (Signed)
Pt to ICU 1225 from OR being manually ventilated by MD. Pt placed on Ventilator, Per Dr Zettie Pho settings. Pt ett in place with tape. Switched to commercial tube holder and secured at 21 at the lip as previously set. Pt stable throughout with no complications. ABG to be obtained 30 minutes post ventilator initiated per verbal order by Dr Tresa Moore. CCM to see Pt.

## 2014-11-19 NOTE — Anesthesia Postprocedure Evaluation (Signed)
  Anesthesia Post-op Note  Patient: Financial planner  Procedure(s) Performed: Procedure(s): ROBOTIC LYSIS OF ADHESIONS, CONVERTED TO LAPAROTOMY RADICAL UPPER VAGINECTOMY,LOW ANTERIOR BOWEL RESECTION, COLOSTOMY, BILATERAL URETERAL STENT PLACEMENT AND CYSTONOMY CLOSURE (N/A) XI ROBOTIC ASSISTED LOWER ANTERIOR RESECTION (N/A) OSTOMY (N/A)  Patient Location: ICU  Anesthesia Type:General  Level of Consciousness: unresponsive and Patient remains intubated per anesthesia plan  Airway and Oxygen Therapy: Patient remains intubated per anesthesia plan  Post-op Pain: none  Post-op Assessment: Post-op Vital signs reviewed and Patient's Cardiovascular Status Stable  Post-op Vital Signs: Reviewed and stable  Last Vitals:  Filed Vitals:   11/19/14 0805  BP: 137/86  Pulse: 80  Temp: 36.8 C  Resp: 16    Complications: No apparent anesthesia complications

## 2014-11-19 NOTE — Interval H&P Note (Signed)
History and Physical Interval Note:  11/19/2014 9:24 AM  [name]  has presented today for surgery, with the diagnosis of ENDOMETRIAL CANCER  The various methods of treatment have been discussed with the patient and family. After consideration of risks, benefits and other options for treatment, the patient has consented to  Procedure(s): ROBOTIC ASSISTED VAGINECTOMY,LOW ANTERIOR BOWEL RESECTION COLOSTOMY,POSSIBLE REANASTOMOSIS (N/A) XI ROBOTIC ASSISTED LOWER ANTERIOR RESECTION (N/A) OSTOMY, possible (N/A) Rigid PROCTOSCOPY (N/A) as a surgical intervention .  The patient's history has been reviewed, patient examined, no change in status, stable for surgery.  I have reviewed the patient's chart and labs.  Questions were answered to the patient's satisfaction.     [name] C.

## 2014-11-19 NOTE — Op Note (Signed)
OPERATIVE NOTE  Surgeon: Donaciano Eva   Co- Surgeon: Michael [name], MD  Assistants: Lahoma Crocker, MD (an MD assistant was necessary for tissue manipulation, management of robotic instrumentation, retraction and positioning due to the complexity of the case and hospital policies).   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  Post-operative Diagnosis: same  Operation: Robotic-assisted lysis of adhesions, converted to laparotomy, radical upper vaginectomy and low anterior resection with colostomy. Bilateral ureteral stent placement and cystotomy repair  Surgeon: Donaciano Eva  Assistant Surgeon: Lahoma Crocker MD  Anesthesia: GET  Urine Output: unrecordable secondary to cystotomy at 1 hour into case  Operative Findings:  : Extreme abdominal obesity preventing optimal visualization of the surgical field via a minimally invasive approach and necessitating conversion to laparotomy, dense adhesions between small intestine and upper abdominal wall and anterior abdominal wall. Adhesions between loops of small bowel and vaginal cuff. 5cm tumor at the vaginal apex/right of the rectum which was adherent to but separable from the right pelvic side wall.  No residual or palpable tumor at the completion of the case.  Estimated Blood Loss:  1200cc      Total IV Fluids: 4000 ml crystalloid, 743mL albumen, 2 units of PRBC (post transfusion Hb 11mg /dL)         Specimens: sigmoid rectum with upper vagina    Drains: Blake drain (right lower quadrant) resting in pelvic cul de sac. Foley catheter (DO NOT REMOVE). Bilateral ureteral stents. Colostomy.       Complications:  None; patient tolerated the procedure well.         Disposition: PACU - hemodynamically stable.  Procedure Details  The patient was seen in the Holding Room. The risks, benefits, complications,  treatment options, and expected outcomes were discussed with the patient.  The patient concurred with the proposed plan, giving informed consent.  The site of surgery properly noted/marked. The patient was identified as Financial planner and the procedure verified as a Robotic-assisted radical upper vaginectomy and low anterior resection with colostomy. A Time Out was held and the above information confirmed.  Procedure:  The patient was brought to the operating room where general anesthesia was administered with no complications.  The patient was placed in the dorsal lithotomy position in padded Allen stirrups.  The arms were tucked at the sides with gel pads protecting the elbows and foam protecting the hands. The patient was then prepped.  A Foley was placed to gravity. An EEA sizer was placed in the vagina.  The patient was then draped in the normal manner.  Next, a 5 mm skin incision was made 1 cm below the subcostal margin in the midclavicular line.  The 5 mm Optiview port and scope was used for direct entry.  Opening pressure was under 10 mm CO2.  The abdomen was insufflated and the findings were noted as above.   At this point and all points during the procedure, the patient's intra-abdominal pressure did not exceed 15 mmHg. Next, a 10 mm skin incision was made 4cm above the umbilicus and a right and left port was placed about 10 cm lateral to the robot port on the right and left side.  A fourth arm was placed in the left lower quadrant 2 cm above and superior and medial to the anterior superior iliac spine.  All ports were placed under direct visualization.  The patient was placed in steep  Trendelenburg. Lysis adhesions was performed laparoscopically for 30 minutes to separate the small bowel loops from the anterior abdominal wall. Bowel was away into the upper abdomen.  The robot was docked in the normal manner.  Extensive lysis of adhesions was performed for 20 minutes to separate small bowel loops sharply  from the pelvic side walls and from eachother. In the process of retracting the sigmoid colon and right colon, there were 3 episodes of bleeding encountered from the mesentery of these structures requiring interrupted 3-0 vicryl sutures.  The right retroperitoneum was entered with the scissors. Dense retroperitoneal fibrosis was encountered. Extensive meticulous dissection with sharp and monopolar dissection took place to identify and sketonize the right ureter in the retroperitoneum. It was separated from where it was adherent to the iliac vessels and was skeletonized and tunneled to its insertion into the bladder.  The EEA sizer was used in the vagina to delineate this structure. The peritoneum of the bladder was elevated and an attempt was made to sharply dissect the bladder from the vagina. In the process of doing this a cystotomy was unavoidably created secondary to the tumor at the vaginal apex infiltrating into the detrusor muscle of the posterior/inferior bladder.    The sigmoid colon was then mobilized medially by creating a lateral incision on the left para-colic gutter and entering the retroperitoneal space. At this point, safe identification of the left ureter was not felt to be possible secondary to her extreme abdominal adiposity and retroperitoneal fibrosis which was secondary to a prior lymph node dissection. Therefore a decision was made to convert to laparotomy to complete the case.   A midline vertical incision was made and carried through the subcutaneous tissue to the fascia. The fascial incision was made and extended superiorally. The rectus muscles were separated. The peritoneum was identified and entered. Peritoneal incision was extended longitudinally.  The abdominal cavity was entered sharply and without incident. A Bookwalter retractor was then placed.  Using sharp and meticulous monopolar dissection the retroperitoneal space on the left was explored and the left ureter was  identified crossing the iliacs. It was skeletonized and mobilized and tunneled into its insertion into the bladder.   Dissection was peformed using monopolar energy and the ligasure to free the para-colpos tissues on the right (which contained the tumor) from the right side wall. The uterosacral ligaments were transected at the level of their insertion bilaterally. The para-colpos tissues on the left were also skeletonized and dissected with the monopolar device. The EEA sizer was used to define the vagina below the bladder and dissection using metzenbaum scissors and the bovie. When the vagina had been adequately mobilized beyond the bladder, distally, a colpotomy was made in the anterior vaginal wall 2cm (palpably) distal to the palpable end of tumor. The vaginal incision was then extended circumferentially using the bovie. The bovie, sharp dissection and the ligasure were employed to dissect through the paravaginal tissues bilaterally to mobilize the tumor, upper vagina and rectum from their side wall attachments. This was a substantially challenging dissection secondary to the patient's morbid obesity.   The posterior vaginal wall was incised with the bovie and the rectovaginal septum was developed distally.   Dr Johney Maine, who was present throughout the entirety of the procedure became primary lead surgeon throughout the low anterior resection. During this time the rectum and distal sigmoid colon was freed from its attachments to the sacral and lateral side walls, The distal rectum was tubularized at least 4cm (palpably) from  the tumor. At this point a contour stapling device was deployed to secure the distal resection margin of rectum after care to ensure ureters or other vital structures were not included. The proximal resection margin of the sigmoid was selected and the contour staple was deployed after care to ensure ureters or other vital structures were not included. The mestentery of the sigmoid colon  was transected and sealed with the ligasure to separate the specimen (an en bloc specimen of rectum with sigmoid and upper vagina and tumor. The specimen was marked with a stitch on the edge of the posterior vaginal margin.  Copious irrigation of the pelvis was performed.  The vaginal cuff was closed with a running 0-vicryl suture.  The bladder was inspected through the cystotomy which was oriented vertically through the trigone extending through the bladder base to the posterior dome (10cm). The left and right ureteral orifices were identified and 4.8F, 26inch double J stents were placed over guide wires into both the left and right ureters. Placement/location in the renal pelvices of the proximal J was confirmed with manual palpation.  The mucosal edges of the cystotomy were then closed with 4-0 vicryl running suture with care to ensure that the stents or ureteral orifices were not included in the closure. A second layer of the closure was performed with 3-0 vicryl in a running suture to imbricate the suture line. The foley catheter was replaced. The bladder was retrograde filled to 250cc. A leak was noted at the distal posterior edge of the closure. This was reinforced with a running 3-0 vicryl suture, and retesting with retrograde filling the bladder confirmed a water tight closure.  A 78mm flat drain was placed posterior to the bladder and exited the right lower quadrant.  Copious irrigation took place. Hemostasis was confirmed at all sites.   Dr Johney Maine performed the mobilization of the sigmoid colon to facilitated its passage through the abdominal wall. Please refer to his dictation regarding formation of the colostomy.   After the colostomy site had been developed and the proximal sigmoid colon staple line was delivered through this site, the abdominal closure took place.   The fascia was reapproximated with 0 looped PDS using a total of two sutures. The subcutaneous layer was then irrigated  copiously.  Exparel long acting local anesthetic was infiltrated into the subcutaneous tissues. The skin was closed with staples with 4 chlorhexadine soaked wicks included in the closure to be removed later. The robotic port sites were closed with 4-0 monocryl.  Please refer to Dr Clyda Greener note regarding maturation of the colostomy.  Sponge, lap and needle counts were correct x 2.   Donaciano Eva, MD

## 2014-11-19 NOTE — Transfer of Care (Signed)
Immediate Anesthesia Transfer of Care Note  Patient: [facility]  Procedure(s) Performed: Procedure(s): ROBOTIC LYSIS OF ADHESIONS, CONVERTED TO LAPAROTOMY RADICAL UPPER VAGINECTOMY,LOW ANTERIOR BOWEL RESECTION, COLOSTOMY, BILATERAL URETERAL STENT PLACEMENT AND CYSTONOMY CLOSURE (N/A) XI ROBOTIC ASSISTED LOWER ANTERIOR RESECTION (N/A) OSTOMY (N/A)  Patient Location: ICU  Anesthesia Type:General  Level of Consciousness: sedated, unresponsive and patient cooperative  Airway & Oxygen Therapy: Patient remains intubated per anesthesia plan and Patient placed on Ventilator (see vital sign flow sheet for setting)  Post-op Assessment: Report given to RN  Post vital signs: Reviewed and stable  Last Vitals:  Filed Vitals:   11/19/14 0805  BP: 137/86  Pulse: 80  Temp: 36.8 C  Resp: 16    Complications: No apparent anesthesia complications

## 2014-11-19 NOTE — Op Note (Signed)
11/19/2014  6:35 PM  PATIENT:  [name]  59 y.o. female  Patient Care Team: Leana Gamer, MD as PCP - General (Internal Medicine) [name], MD as Referring Physician (Obstetrics and Gynecology) Heath Lark, MD as Consulting Physician (Hematology and Oncology) Everitt Amber, MD as Consulting Physician (Obstetrics and Gynecology)  PRE-OPERATIVE DIAGNOSIS:  ENDOMETRIAL CANCER  POST-OPERATIVE DIAGNOSIS:  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  PROCEDURE:   LAPAROSCOPIC/ROBOTIC LYSIS OF ADHESIONS*^ EXPLORATORY LAPAROTOMY^ RESECTION OF PELVIC MASS^ RADICAL UPPER VAGINECTOMY^ LOW ANTERIOR RECTOSIGMOID RESECTION* END DESCENDING COLOSTOMY* BILATERAL URETERAL STENT PLACEMENT^ CYSTOTOMY CLOSURE^   SURGEON:  : Everitt Amber, MD^ Michael [name], MD*  ASSISTANT:  Lahoma Crocker, MD  ANESTHESIA:   local and general  EBL:  Total I/O In: [id number] [I.V.:4800; Blood:700; IV Piggyback:750] Out: [id number] [Urine:320; Blood:1200]  Delay start of Pharmacological VTE agent (>24hrs) due to surgical blood loss or risk of bleeding:  no  DRAINS: (19Fr) Blake drain(s) in the pelvis   SPECIMEN:  PELVIC MASS WITH RECTOSIGMOID & POSTERIOR VAGINAL WALL  DISPOSITION OF SPECIMEN:  PATHOLOGY  COUNTS:  YES  PLAN OF CARE: Admit to inpatient   PATIENT DISPOSITION:  ICU - intubated and hemodynamically stable.  INDICATION:    Pleasant morbidly obese female with uterine cancer status post hysterectomy and bilateral oophorectomy with resection ofpelvic mass off rectum.  Has developed recurrence adherent to rectum and posterior vaginal wall.  Discussion with Dr. Denman [name] with gynecological oncology and myself.  We recommended segmental resection:  The anatomy & physiology of the digestive tract was discussed.  The pathophysiology was discussed.  Natural history risks without surgery was discussed.   I worked to give an overview of the disease and the frequent need to have multispecialty involvement.  I  feel the risks of no intervention will lead to serious problems that outweigh the operative risks; therefore, I recommended a partial colectomy to remove the pathology.  Laparoscopic & open techniques were discussed.   Risks such as bleeding, infection, abscess, leak, reoperation, possible ostomy, hernia, heart attack, death, and other risks were discussed.  I noted a good likelihood this will help address the problem.   Goals of post-operative recovery were discussed as well.  We will work to minimize complications.  Educational materials on the pathology had been given in the office.  Questions were answered.    The patient expressed understanding & wished to proceed with surgery.  OR FINDINGS:   Patient had bulky tumor in the mid pelvis especially along the right lateral pelvic wall.  Smooth encapsulation along the lateral sidewall but very thin margin.  Anterior rectum and posterior vaginal wall densely adherent to it. 5 x 5 x 5 cm region. Some central necrosis.  No obvious metastatic disease on visceral parietal peritoneum or liver.  Patient has a end colostomy.  There is a 1 cm rectal stump  DESCRIPTION:   Informed consent was confirmed.  The patient underwent general anaesthesia without difficulty.  The patient was positioned appropriately.  VTE prevention in place.  The patient's abdomen was clipped, prepped, & draped in a sterile fashion.  Surgical timeout confirmed our plan.  Dr. Denman [name] placed laparoscopic & robotic carefully.  See her operative note for details.  Patient had moderate adhesions of small bowel and transverse colon to nd the anterior abdominal wall.  These were laparoscopically freed off by Dr. Denman [name] and myself using cold scissors down to the upper pelvic brim.  Xi Robot was docked.  She proceeded to complete lysis of  adhesions robotically And gradually free the small bowel off the pelvis and rectosigmoid colon.  Interloop adhesions were freed off as well..  She then  proceeded to robotic dissection to identify the right and left ureter and do ureterolysis.  Please see her note for details.  However the patient had a very dense firm mass with difficulty in seeing anatomy well.  Tissues were poor. After 2 half hours of dissection with progress we come to a point where anatomical planes were very obliterated and it became difficult to continue robotically. Therefore we converted to open through a midline laparotomy incision.  Further identified the ureters and skeletonized further. I was able to help free the rectum off the retroperitoneum between the well dissected ureters over the sacral promontory and freed rectum off the sacrum down to the pelvic floor at least posteriorly.  Eventually was able to come around the left lateral sidewall.  Dr. Denman [name] had to transect the rectovaginal septum and posterior vaginal wall to get good margins around the mass that was densely adhered on the right pelvic especially.  The base of the bladder was nearby but not actively invaded by the tumor.  Jeris Penta were able to mobilize the mass off the  right lateral sidewall &  the right anterior sidewall after taking a moderate cuff of most of the posterior rectalvaginal septum & posterior vaginal wall.  Please see her notes for detail.  I was able to do total mesorectal dissection to mobilize the rectum down to the pelvic brim.  I was able to resect the rectum almost flush with the pelvic floor using a contour stapler.  Distal to the mesial rectum. This help eviscerate the specimen out.  I transect the proximal and of the en bloc resection at the proximal sigmoid colon.  And up having to take the superior hemorrhoidal and left colic pedicles in the inferior mesenteric vein to have enough length to reach through her thick abdominal wall at the premarked colostomy site at her left periumbilical paramedian region.  Given the fact I had to do a near complete proctectomy to have adequate margins, did not  feel it was a good idea to perform anastomosis given the fact this is a cancer pelvic recurrence and despite doing colostomy.  Bladder and vaginal defects were closed per Dr. Serita Grit note.  Rectal stump inspected and intact.  There is no omentum to bring down for an omentopexy.  We did comes irrigation.  Hemostasis good.  Clips were placed along the right pelvic side wall to mark out the region of where the pelvic mass was for anticipated radiation later post-adjuvantly.  Please see port closures in her note.  Colostomy brought up through a left paramedian wound, slightly lateral to the midclavicular line give her panniculus to stay way from the midline incision.  Disc of skin and subcutaneous tissue cylinder resected down to fascia.  Anterior rectus fascia opened transversely 3 cm and later opened to 4 cm.  Able to bring the end of the descending colon up through the abdominal wall x 8 cm easily.  Colostomy secured to the fascia with 2-0 silk suture 5.  Colostomy secured to the peritoneum and posterior rectus fascia using 2-0 silk suture 2.  Abdominal pelvic inspection done.  No evidence of colon or bowel injury.  No leak on the bladder.  Vagina closure and rectal stumps intact.  Copious irrigation done of several liters done.  Hemostasis was good.  Midline incision closed using #1 PDS in  a running fashion.  Skin of the midline incision brought together with skin staples.  Antibiotic-soaked umbilical tape wicks placed in 4 locations. Honeycomb sterile dressing placed.  After the dressings completely placed colostomy matured using 3-0 Vicryl suture in a Brooke fashion.  I did have to resect some large epiploic appendages and able to reduce the bulky mesenteric fat into the subcutaneous tissues.  However controlled and perked up well with a nice rosebud ostomy.  Discussion was made with anesthesia. Patient received blood products and it was long case.  Anesthesia was leaning towards leaving patient  intubated.  Dr. Tresa Moore had discussed with pulmonary critical care about helping follow the patient.  We will see if the patient can be extubated.  At the very least, the patient will be in the intensive care unit.    [name], M.D., F.A.C.S. Gastrointestinal and Minimally Invasive Surgery [facility] [address] [place], [place] [id number] [phone] Main / Paging

## 2014-11-19 NOTE — Progress Notes (Signed)
eLink Physician-Brief Progress Note Patient Name: [name] DOB: 28-Jul-1956 MRN: [id number]   Date of Service  11/19/2014  HPI/Events of Note  59 yo female with PMHx of DM, GERD, HTN, Obesity, now with endometrial cancer s\p ROBOTIC LYSIS OF ADHESIONS, CONVERTED TO LAPAROTOMY RADICAL UPPER VAGINECTOMY,LOW ANTERIOR BOWEL RESECTION, COLOSTOMY, BILATERAL URETERAL STENT PLACEMENT AND CYSTONOMY CLOSURE (N/A) XI ROBOTIC ASSISTED LOWER ANTERIOR RESECTION (N/A) OSTOMY (N/A)  PCCM consulted for medical and vent management  eICU Interventions  S\P surgcial resection of endometrial cancer - wean MV as tolerated, maintain saturation >90% - monitor fluid status - BP, pain control - propofol gtt  GI/DVT prophylaxis  Full consult note to follow     Intervention Category Evaluation Type: New Patient Evaluation  Pryce Folts 11/19/2014, 7:18 PM

## 2014-11-19 NOTE — Progress Notes (Addendum)
Per Agricultural consultant, report from Dr Tresa Moore in [place], Vent Settings to be Rate of 12 and VT of 600.

## 2014-11-19 NOTE — H&P (View-Only) (Signed)
Preoperative Followup Note: Gyn-Onc  Consult was originally requested by Dr. Alvy Bimler for the evaluation of Dashauna Dedman 59 y.o. 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  CC:  Chief Complaint  Patient presents with  . Ovarian Cancer    Assessment/Plan:  Ms. Militza Devery  is a 59 y.o.  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 I reviewed her imaging records with her. We discussed that the MRI shows that the lesion is approximately 3.5 x 5 cm apparently in the right side of the rectum. There is no obvious involvement of the right ureter (or left ureter) or bladder however that is a potential concern given the close proximity of the structures. The PET scan shows no evidence of distant metastatic disease.  I'm still recommending primary resection of this mass given his bulky nature and my concern that sterilization with radiation alone carries with it limited likelihood of cure, and given her underlying chronic idiopathic neutropenia she is likely to struggle with receiving adequate doses of chemotherapy for treatment. After discussion with her surgeon Dr. Alwyn Pea with determine optimal treatment would be primary surgical debulking with an upper vaginectomy, radical parametrectomy, a low anterior resection. Postoperatively we will radiate the pelvis. We will attempt this minimally invasively with robotic assistance however the patient understands there is a chance for conversion to laparotomy if this is approach is not feasible. I discussed the possibility for a ureteral or bladder involvement which might require resection of the structures and reimplantation of the ureter into the bladder. I discussed that this can be complicated by long-term fistula formation and multiple procedures to amend this situation. We have sent bladder cytology today to better evaluate for any gross bladder involvement however there does not appear to  be so on the MRI oral my physical examination.  Because this is a recurrence of her tumor, because she has a condition significantly increasing her risk for sepsis if she were to develop an anastomotic leak, and because she will require postoperative radiation, we are recommending a primary end colostomy rather than re-anastomosis and diverting ileostomy. I discussed with the patient that an end colostomy is potentially reversible stoma. However such reversal would require major abdominal surgery and risk in a radiated patient. Therefore it is possible that her an end colostomy may be permanent in her case. I discussed that in the situation of recurrence we typically wait until the passage of time has proven that a surgical approach had achieved cure for attempting such reversal. I would recommend reversal no sooner than 3 years postoperatively.  She will be evaluated and marked by the ostomy nurses in the preoperative area to guide [place] some placement of her colostomy or if performed ileostomy.  I again discussed surgical risks with Toye including  bleeding, infection, damage to internal organs (such as bladder,ureters, bowels), blood clot, reoperation and rehospitalization. I discussed that infectious risks are elevated secondary to her underlying neutropenia. Last week her total WBC was 2.0. She received granulocyte colony stim factor last week and will again receive doses preoperatively next week.  HPI: Caitlen Worth is a 59 year old woman with a history of endometrial and ovarian endometrioid carcinoma treated in 2006 by Dr. [name] at [facility] in [place]. Her surgery (TAH, BSO) was followed by adjuvant chemotherapy with carboplatin plus paclitaxel due to the ovarian involvement and the presence of a cul de sac lesion also positive for disease. She denies receiving adjuvant radiation  therapy. She is unclear if she had metastatic endometrial cancer to the ovary or duel primaries.  She had a complete response to therapy however developed leukopenia in July 2010. 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 She sees Dr. Simeon Craft such for this and is treated with G-CSF injections.  She began experiencing rectal pain approximately 3 months ago. She also reports narrowing of caliber of the stool. She denies hematochezia. She does report approximately 3 months of vaginal spotting. She's had no specific follow-up for her gynecologic cancers in the past 4 years.  As part of workup of her rectal pain she underwent a CT scan of the abdomen and pelvis on 09/12/2014. This demonstrated a new right perirectal mass abutting the vaginal cuff measuring 3.8 x 4.9 cm. There is a limited fat plane between the mass and the rectum posteriorly. Rectal invasion could not be excluded. There were no other masses identified in the abdomen and pelvis or lymphadenopathy. There is no other evidence of metastatic disease or recurrent disease. There was no hydronephrosis. A CA-125 drawn on 09/12/2014 was normal at 10.  PET was negative for extrapelvic disease. MRI defined the lesion as a 3.5x5cm lesion to the right of the rectum at the vaginal cuff.  Colonoscopy was performed on 10/02/14 with transrectal [place] and biopsy and this revealed endometrioid adenocarcinoma. Of note, the lesion was not seen within the lumen of the rectum.   Current Meds:  Outpatient Encounter Prescriptions as of 11/11/2014  Medication Sig  . albuterol (PROVENTIL HFA;VENTOLIN HFA) 108 (90 BASE) MCG/ACT inhaler Inhale 2 puffs into the lungs every 6 (six) hours as needed for wheezing or shortness of breath.  . bisacodyl (DULCOLAX) 5 MG EC tablet Take 20 mg by mouth once. 7 am day before procedure  . Calcium Carbonate-Vitamin D (CALCIUM + D PO) Take 2 tablets by mouth daily.  . Canagliflozin (INVOKANA) 300 MG TABS Take 1 tablet by  mouth every morning.   . Cholecalciferol (VITAMIN D3) 10000 UNITS capsule Take 10,000 Units by mouth once a week. Sundays  . docusate sodium (COLACE) 100 MG capsule Take 100 mg by mouth daily.  . hydrochlorothiazide (HYDRODIURIL) 12.5 MG tablet Take 12.5 mg by mouth every morning.   [name] Kitchen levothyroxine (SYNTHROID) 175 MCG tablet Take 175 mcg by mouth daily before breakfast.  . metFORMIN (GLUCOPHAGE) 1000 MG tablet Take 1,000 mg by mouth 2 (two) times daily with a meal.   . Multiple Vitamin (MULTIVITAMIN WITH MINERALS) TABS tablet Take 1 tablet by mouth daily.  . naproxen sodium (ALEVE) 220 MG tablet Take 220 mg by mouth every 12 (twelve) hours.   [name] Kitchen omega-3 acid ethyl esters (LOVAZA) 1 G capsule Take 1 g by mouth 2 (two) times daily.  [name] Kitchen omeprazole (PRILOSEC) 20 MG capsule Take 20 mg by mouth daily.  Glory Rosebush VERIO test strip   . oxyCODONE-acetaminophen (PERCOCET/ROXICET) 5-325 MG per tablet Take 1-2 tablets by mouth every 4 (four) hours as needed for severe pain.  Vladimir Faster Glycol-Propyl Glycol (SYSTANE OP) Apply 1 drop to eye daily as needed (Dry eyes).   . polyethylene glycol (MIRALAX / GLYCOLAX) packet Take 17 g by mouth daily. 10 am day before procedure  . rosuvastatin (CRESTOR) 10 MG tablet Take 10 mg by mouth every evening.   . sitaGLIPtin (JANUVIA) 100 MG tablet Take 100 mg by mouth daily.  . Tbo-Filgrastim (GRANIX) 480 MCG/0.8ML SOSY injection Inject 480 mcg  into the skin once a week.  . traMADol (ULTRAM) 50 MG tablet Take 1-2 tablets (50-100 mg total) by mouth every 6 (six) hours as needed. (Patient taking differently: Take 100 mg by mouth at bedtime. )  . valsartan (DIOVAN) 320 MG tablet Take 320 mg by mouth every morning.  . fluconazole (DIFLUCAN) 100 MG tablet Take 1 tablet (100 mg total) by mouth once. May repeat prn (Patient not taking: Reported on 11/11/2014)  . metroNIDAZOLE (FLAGYL) 500 MG tablet Take 1,000 mg by mouth 3 (three) times daily. 2 tablets at 2pm.3pm, and 10pm. Day  before procedure  . neomycin (MYCIFRADIN) 500 MG tablet Take 1,000 mg by mouth 3 (three) times daily. 2 tablets at 2pm.3pm, and 10pm. Day before procedure  . [DISCONTINUED] Tbo-Filgrastim (GRANIX) injection 480 mcg     Allergy:  Allergies  Allergen Reactions  . Penicillins Swelling    Facial swelling  . Adhesive [Tape]     blisters  . Cefaclor Rash    Ceclor  . Erythromycin     gastritis  . Trimethoprim Rash  . Sulfa Antibiotics Rash    Social Hx:   History   Social History  . Marital Status: Married    Spouse Name: N/A  . Number of Children: N/A  . Years of Education: N/A   Occupational History  . Not on file.   Social History Main Topics  . Smoking status: Never Smoker   . Smokeless tobacco: Never Used  . Alcohol Use: Yes     Comment: rare social  . Drug Use: No  . Sexual Activity: Not Currently   Other Topics Concern  . Not on file   Social History Narrative    Past Surgical Hx:  Past Surgical History  Procedure Laterality Date  . Appendectomy    . Tonsillectomy    . Cholecystectomy      laparoscopic  . Eye surgery      pytosis of eyelids-child  . Colonoscopy with propofol N/A 08/21/2013    Procedure: COLONOSCOPY WITH PROPOFOL;  Surgeon: Cleotis Nipper, MD;  Location: [facility];  Service: Endoscopy;  Laterality: N/A;  . Abdominal hysterectomy      staging for Ovarian cancer  . Eus N/A 10/02/2014    Procedure: LOWER ENDOSCOPIC ULTRASOUND (EUS);  Surgeon: Arta Silence, MD;  Location: [facility];  Service: Endoscopy;  Laterality: N/A;    Past Medical Hx:  Past Medical History  Diagnosis Date  . Hypothyroidism 12/14/2011  . Benign essential HTN 12/14/2011  . Neutropenia   . Postmenopausal bleeding 09/05/2014  . Anemia     Planned Neupogen injections for chronic neutropenia  . Transfusion history     '06-s/p hysterectomy and staging  . Multiple thyroid nodules y-8    Managed by Dr. Harlow Asa  . GERD (gastroesophageal reflux disease)   . Polyp  of duodenum y-2  . DM type 2 (diabetes mellitus, type 2) 12/14/2011  . Dysrhythmia     skipped beat with occasional coffee   . Hx of bronchitis   . Depression   . Hiatal hernia y-12    hx of small hiatal hernia   . Neutropenia     chronic   . Cancer     '06-Ovarian/ Uterine Cancer-chemotherapy    Past Gynecological History:  G0. Endometrial and ovarian cancer s/p TAH, BSO and chemotherapy  With carboplatin and paclitaxel  No LMP recorded. Patient has had a hysterectomy.  Family Hx:  Family History  Problem Relation Age of Onset  .  Cancer Mother     stomach ca  . Hypertension Mother   . Cancer Father     prostate ca  . Diabetes Father   . Diabetes Sister   . Hypertension Brother y-10    Review of Systems:  Constitutional  Feels well,    ENT Normal appearing ears and nares bilaterally Skin/Breast  No rash, sores, jaundice, itching, dryness Cardiovascular  No chest pain, shortness of breath, or edema  Pulmonary  No cough or wheeze.  Gastro Intestinal  No nausea, vomitting, or diarrhoea. No bright red blood per rectum, no abdominal pain, + in bowel movement (narrowed stool callibre), no constipation.  Genito Urinary  No frequency, urgency, dysuria, + vaginal spotting Musculo Skeletal  No myalgia, arthralgia, joint swelling or pain  Neurologic  No weakness, numbness, change in gait,  Psychology  No depression, anxiety, insomnia.   Vitals:  141/74, pulse 84, weight 251, temp 98.2, RR 18  Physical Exam: WD in NAD Neck  Supple NROM, without any enlargements.  Lymph Node Survey No cervical supraclavicular or inguinal adenopathy Cardiovascular  Pulse normal rate, regularity and rhythm. S1 and S2 normal.  Lungs  Clear to auscultation bilateraly, without wheezes/crackles/rhonchi. Good air movement.  Skin  No rash/lesions/breakdown  Psychiatry  Alert and oriented to person, place, and time  Abdomen  Normoactive bowel sounds, abdomen soft, non-tender and morbidly  obese without evidence of hernia.  Back No CVA tenderness Genito Urinary  Vulva/vagina: Normal external female genitalia.  No lesions. No discharge or bleeding.  Bladder/urethra:  No lesions or masses, well supported bladder  Vagina: erythematous nodular area at right vaginal apex. Biopsied with kevorkian biopsy forceps. Hemostatic.  Cervix: surgically absent  Uterus: surgically absent   Adnexa: no adnexal masses. However a 5 cm mass is appreciated in the midline adherent to the vagina and rectum. It is irregular in shape, cauliflower-like. It is mobile. It is nontender. Rectal  Good tone, rectovaginal mass appreciated (as above) approximately 4-5cm from anal verge. It is easily appreciated with the examining hand.  Extremities  No bilateral cyanosis, clubbing or edema.   Donaciano Eva, MD   11/11/2014, 5:57 PM

## 2014-11-19 NOTE — Consult Note (Signed)
PULMONARY / CRITICAL CARE MEDICINE   Name: [name] MRN: [id number] DOB: 1956/01/28    ADMISSION DATE:  11/19/2014 CONSULTATION DATE:  11/19/2014  REFERRING MD :  Denman [name]   CHIEF COMPLAINT:  Endometrioid adenocarcinoma  INITIAL PRESENTATION:  59 y.o. F with hx of endometrial and ovarian CA s/p TAH and BSO in 2006, evaluated for abd pain, rectal spasm, and occasional vaginal bleeding.  CT revealed large right pelvic mass and transrectal [place] and biopsy revealed endometrioid adenocarcinoma.  Underwent resection on 4/19 and was kept on ventilator overnight.  PCCM consulted for vent management.   STUDIES:  CT A/P 09/12/14 >>> new right pelvic mass abutting the vaginal cuff and rectum, high worrisome for recurrent ovarian CA.  Rectal lesion cannot be excluded.  No other evidence of metastatic disease demonstrated. MRI pelvis 10/14/14 >>> 5.3 cm multilobulated soft tissue mass in the hysterectomy bed c/w recurrent carcinoma. PET [id number] >>> hypermetabolic pelvic soft tissue mass involving the vaginal cuff and pelvic cul-de-sac c/w recurrent carcinoma.  No evidence of pelvic lymph node or distant metastatic disease.  SIGNIFICANT EVENTS: 09/20/14 - vaginal biopsy with squamous mucosa without dysplasia or malignancy identified 11/19/14 - admitted and underwent robotic-assisted lysis of adhesions, converted to laparotomy, radical upper vaginectomy and low anterior resection with colostomy. Bilateral ureteral stent placement and cystotomy repair   HISTORY OF PRESENT ILLNESS:  Pt is encephalopathic; therefore, this HPI is obtained from chart review. [name] is a 59 y.o. F with PMH as outlined below including hx of endometrial and ovarian CA s/p TAH and BSO with adjuvant chemo in 10/28/2004 (Dr. Delfin Edis at [facility] unable to be obtained through care everywhere).  She had no evidence of recurrent disease; however, has recently had episodes of rectal spasming and pain with occasional  vaginal bleeding, crampy abdominal pain, intermittent loose stools Symptoms started in fall of 2015 and progressed.  She had CT of abd/pelvis done which was concerning for pelvic mass between vagina and rectum.  She transrectal [place] and biopsy done which revealed endometrioid adenocarcinoma.  She was evaluated by general surgery and on 4/19, she underwent Robotic-assisted lysis of adhesions, converted to laparotomy, radical upper vaginectomy and low anterior resection with colostomy. Bilateral ureteral stent placement and cystotomy repair. Post-operatively she was recovered in PACU, and it was felt that she needed to remain intubated. She was transferred to ICU. PCCM was consulted for ventilator management.    PAST MEDICAL HISTORY :   has a past medical history of Hypothyroidism (12/14/2011); Benign essential HTN (12/14/2011); Neutropenia; Postmenopausal bleeding (09/05/2014); Anemia; Transfusion history; Multiple thyroid nodules (y-8); GERD (gastroesophageal reflux disease); Polyp of duodenum (y-2); DM type 2 (diabetes mellitus, type 2) (12/14/2011); Dysrhythmia; bronchitis; Depression; Hiatal hernia (y-12); Neutropenia; and Cancer.  has past surgical history that includes Appendectomy; Tonsillectomy; Cholecystectomy; Eye surgery; Colonoscopy with propofol (N/A, 08/21/2013); Abdominal hysterectomy; and EUS (N/A, 10/02/2014). Prior to Admission medications   Medication Sig Start Date End Date Taking? Authorizing Provider  albuterol (PROVENTIL HFA;VENTOLIN HFA) 108 (90 BASE) MCG/ACT inhaler Inhale 2 puffs into the lungs every 6 (six) hours as needed for wheezing or shortness of breath. 09/09/14  Yes Leana Gamer, MD  bisacodyl (DULCOLAX) 5 MG EC tablet Take 20 mg by mouth once. 7 am day before procedure   Yes Historical Provider, MD  Calcium Carbonate-Vitamin D (CALCIUM + D PO) Take 2 tablets by mouth daily.   Yes Historical Provider, MD  Canagliflozin (INVOKANA) 300 MG TABS Take 1 tablet by mouth  every  morning.    Yes Historical Provider, MD  Cholecalciferol (VITAMIN D3) 10000 UNITS capsule Take 10,000 Units by mouth once a week. Sundays   Yes Historical Provider, MD  docusate sodium (COLACE) 100 MG capsule Take 100 mg by mouth daily.   Yes Historical Provider, MD  hydrochlorothiazide (HYDRODIURIL) 12.5 MG tablet Take 12.5 mg by mouth every morning.    Yes Historical Provider, MD  levothyroxine (SYNTHROID) 175 MCG tablet Take 175 mcg by mouth daily before breakfast.   Yes Historical Provider, MD  metFORMIN (GLUCOPHAGE) 1000 MG tablet Take 1,000 mg by mouth 2 (two) times daily with a meal.    Yes Historical Provider, MD  metroNIDAZOLE (FLAGYL) 500 MG tablet Take 1,000 mg by mouth 3 (three) times daily. 2 tablets at 2pm.3pm, and 10pm. Day before procedure   Yes Historical Provider, MD  Multiple Vitamin (MULTIVITAMIN WITH MINERALS) TABS tablet Take 1 tablet by mouth daily.   Yes Historical Provider, MD  naproxen sodium (ALEVE) 220 MG tablet Take 220 mg by mouth every 12 (twelve) hours.    Yes Historical Provider, MD  neomycin (MYCIFRADIN) 500 MG tablet Take 1,000 mg by mouth 3 (three) times daily. 2 tablets at 2pm.3pm, and 10pm. Day before procedure   Yes Historical Provider, MD  omega-3 acid ethyl esters (LOVAZA) 1 G capsule Take 1 g by mouth 2 (two) times daily.   Yes Historical Provider, MD  omeprazole (PRILOSEC) 20 MG capsule Take 20 mg by mouth daily.   Yes Historical Provider, MD  oxyCODONE-acetaminophen (PERCOCET/ROXICET) 5-325 MG per tablet Take 1-2 tablets by mouth every 4 (four) hours as needed for severe pain. 10/11/14  Yes Melissa D Cross, NP  Polyethyl Glycol-Propyl Glycol (SYSTANE OP) Apply 1 drop to eye daily as needed (Dry eyes).    Yes Historical Provider, MD  polyethylene glycol (MIRALAX / GLYCOLAX) packet Take 17 g by mouth daily. 10 am day before procedure   Yes Historical Provider, MD  rosuvastatin (CRESTOR) 10 MG tablet Take 10 mg by mouth every evening.    Yes Historical Provider,  MD  sitaGLIPtin (JANUVIA) 100 MG tablet Take 100 mg by mouth daily.   Yes Historical Provider, MD  Tbo-Filgrastim (GRANIX) 480 MCG/0.8ML SOSY injection Inject 480 mcg into the skin once a week.   Yes Historical Provider, MD  traMADol (ULTRAM) 50 MG tablet Take 1-2 tablets (50-100 mg total) by mouth every 6 (six) hours as needed. Patient taking differently: Take 100 mg by mouth at bedtime.  10/21/14  Yes Melissa D Cross, NP  valsartan (DIOVAN) 320 MG tablet Take 320 mg by mouth every morning.   Yes Historical Provider, MD  fluconazole (DIFLUCAN) 100 MG tablet Take 1 tablet (100 mg total) by mouth once. May repeat prn Patient not taking: Reported on 11/11/2014 08/25/14   Thao P Marin Comment, DO  ONETOUCH VERIO test strip  11/10/14   Historical Provider, MD   Allergies  Allergen Reactions  . Penicillins Swelling    Facial swelling  . Adhesive [Tape]     blisters  . Cefaclor Rash    Ceclor  . Erythromycin     gastritis  . Trimethoprim Rash  . Sulfa Antibiotics Rash    FAMILY HISTORY:  Family History  Problem Relation Age of Onset  . Cancer Mother     stomach ca  . Hypertension Mother   . Cancer Father     prostate ca  . Diabetes Father   . Diabetes Sister   . Hypertension Brother y-10  SOCIAL HISTORY:  reports that she has never smoked. She has never used smokeless tobacco. She reports that she drinks alcohol. She reports that she does not use illicit drugs.  REVIEW OF SYSTEMS:  Unable to obtain as pt is encephalopathic.  SUBJECTIVE:   VITAL SIGNS: Temp:  [98.3 F (36.8 C)] 98.3 F (36.8 C) (04/19 0805) Pulse Rate:  [80] 80 (04/19 0805) Resp:  [16] 16 (04/19 0805) BP: (137)/(86) 137/86 mmHg (04/19 0805) SpO2:  [99 %] 99 % (04/19 0805) Weight:  [113.671 kg (250 lb 9.6 oz)] 113.671 kg (250 lb 9.6 oz) (04/19 0837) HEMODYNAMICS:   VENTILATOR SETTINGS:   INTAKE / OUTPUT: Intake/Output      04/18 0701 - 04/19 0700 04/19 0701 - 04/20 0700   I.V. (mL/kg)  4800 (42.2)   Blood  700    IV Piggyback  750   Total Intake(mL/kg)  6250 (55)   Urine (mL/kg/hr)  150   Blood  1200   Total Output   1350   Net   +4900          PHYSICAL EXAMINATION: General: obese female awake, comfortable on vent Neuro: RASS 0, alert, shakes/nods head appropriately HEENT: [place]/AT, PERRL, no JVD noted Cardiovascular: RRR, no MRG Lungs: Clear bilateral breath sounds, synchronous with vent Abdomen: Soft, generalized tenderness, non-distended. RLQ JP with bloody drainage, LLQ colostomy Musculoskeletal: No acute deformity, ROM limitation, or edema Skin: Grossly intact   LABS:  CBC  Recent Labs Lab 11/15/14 1059 11/18/14 0821 11/19/14 1048  WBC 4.8 11.0*  --   HGB 13.7 13.1 11.6*  HCT 43.8 41.5 34.0*  PLT 355 286  --    Coag's No results for input(s): APTT, INR in the last 168 hours. BMET  Recent Labs Lab 11/19/14 1048  NA 138  K 3.0*   Electrolytes No results for input(s): CALCIUM, MG, PHOS in the last 168 hours. Sepsis Markers No results for input(s): LATICACIDVEN, PROCALCITON, O2SATVEN in the last 168 hours. ABG  Recent Labs Lab 11/19/14 1048  PHART 7.391  PCO2ART 38.3  PO2ART 319.0*   Liver Enzymes No results for input(s): AST, ALT, ALKPHOS, BILITOT, ALBUMIN in the last 168 hours. Cardiac Enzymes No results for input(s): TROPONINI, PROBNP in the last 168 hours. Glucose  Recent Labs Lab 11/19/14 0808  GLUCAP 129*    Imaging No results found.    ASSESSMENT / PLAN:  HEMATOLOGIC / ONCOLOGIC A:   Endometrioid adenocarcinoma -  s/p Robotic-assisted lysis of adhesions, radical upper vaginectomy and low anterior resection with colostomy. Bilateral ureteral stent placement and cystotomy repair on 11/19/14 Hx endometrial and ovarian CA s/p TAH and BSO in 2006 - now with recurrence Mild anemia - received blood products during case Chronic neutropenia - presumed related to previous chemo.  Now normal on this admission.  Follows with hematology as outpatient  ([facility]) VTE Prophylaxis P:  Post op care per GYN - ONC and CCS. Transfuse per usual ICU guidelines. SCD's  CBC in AM. Start VTE chemoprophylaxis in AM  PULMONARY OETT 4/19 >>> A: VDRF Hx OSA P:   Full mechanical support, wean as able. VAP bundle. SBT in AM > suspect will be candidate for extubation. ABG and CXR in AM.  CARDIOVASCULAR A:  Hx essential HTN Hypotension - suspect medication related P:  Tele monitoring Hold outpatient HCTZ, rosuvastatin, valsartan. MAP goal > 84mm/Hg phenylephrine wean to off if able Will adjust sedation medications. If unable to wean may need CVL/pressors  RENAL A:   Hypokalemia  P:   Repeat and follow Bmet NS @ 100.  GASTROINTESTINAL A:   GERD Nutrition P:   Pantoprazole. NPO. TF if remains NPO > 24 hours.  INFECTIOUS A:   No indication for infection P:   Clinda and gentamycin as surgical ppx 4/19 Monitor clinically.  ENDOCRINE A:   DM II Hypothyroidism P:   CBG's q4hr. SSI. Resume outpatient levothyroxine 4/20 as she will likely be extubated Hold outpatient canagliflozin, metformin, sitagliptin,   NEUROLOGIC A:   Acute metabolic encephalopathy Hx Depression P:   Sedation:  Add fentanyl gtt, wean propofol as hypotensive. If necessary will DC prop and add versed PRN PRN dilaudid, decrease dose RASS goal: 0 to -1. Daily WUA. Hold outpatient percocet.   Interdisciplinary Family Meeting v Palliative Care Meeting:  Due by: 4/26  Georgann Housekeeper, AGACNP-BC [facility] Pager [phone] or [phone] 11/19/2014 8:26 PM   Attending Note:  I have examined patient, reviewed labs, studies and notes. I have discussed the case with Jaclynn Guarneri, and I agree with the data and plans as amended above. Patient with recurrent metastatic endometrial adenoCA, s/p extensive sgy for resection of mass. Plan to leave her extubated overnight and assess for extubation am 4/20.   Baltazar Apo, MD,  PhD 11/20/2014, 9:03 AM [facility] [phone] or if no answer [phone]

## 2014-11-19 NOTE — Interval H&P Note (Signed)
History and Physical Interval Note:  11/19/2014 9:31 AM  [name]  has presented today for surgery, with the diagnosis of ENDOMETRIAL CANCER  The various methods of treatment have been discussed with the patient and family. After consideration of risks, benefits and other options for treatment, the patient has consented to  Procedure(s): ROBOTIC ASSISTED VAGINECTOMY,LOW ANTERIOR BOWEL RESECTION COLOSTOMY,POSSIBLE REANASTOMOSIS (N/A) XI ROBOTIC ASSISTED LOWER ANTERIOR RESECTION (N/A) OSTOMY, possible (N/A) Rigid PROCTOSCOPY (N/A) as a surgical intervention .  The patient's history has been reviewed, patient examined, no change in status, stable for surgery.  I have reviewed the patient's chart and labs.  Questions were answered to the patient's satisfaction.     Donaciano Eva

## 2014-11-19 NOTE — Consult Note (Signed)
WOC ostomy consult note Patient seen today per Dr. Johney Maine' request for assessment and preoperative stoma site selection.  Two possible sites, one each for a temporary loop ileostomy and a permanent end colostomy are requested.  The abdomen is assessed in the lying and sitting positions.  The patient  has a soft, obese abdomen and two pronounced creases: one at the umbilicus and the other beneath the abdominal pannus.  I have placed the two requested marks between those landmarks and within the rectus muscle. Luetta Nutting are made with a surgical skin marking pen.  Left mark is [id number] below the umbilicus and [id number] to the left.  Right mark is 5cm below the umbilicus and [id number] to the right. Education:  Patient understands that she may have a pouch following surgery and that the ostomy will either be formed from the small intestine (ileostomy) or the large intestine (colostomy). She knows that a Best boy from our 4-member ostomy nursing team will be available and will follow her for her adjustment, initial management and as a resource following discharge for as long as she has her stoma. [facility] nursing team will follow, and will remain available to this patient, the nursing, surgical and medical teams.  Thank you for consulting [place] on the care of this pleasant woman. Maudie Flakes, MSN, RN, [place], [place], [place] [phone])

## 2014-11-19 NOTE — H&P (Signed)
[name] 09/25/2014 12:15 PM Location: [facility] Patient #: [id number] DOB: 04/08/1956 Married / Language: [name] / Race: White Female  History of Present Illness [name]; 09/25/2014 1:16 PM) Patient words: rectal mass.  The patient is a 59 year old female who presents with an abdominal mass. Patient sent by gynecological oncologist Dr. 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 History of endometrioid cancer of the ovary. Underwent hysterectomy and salpingo-oophorectomy and possible intraperitoneal chemotherapy in 10/28/2004 by Dr. Rhodia Albright at [facility] He cannot pull up records through care everywhere. Trying to obtain operative reports. Had no evidence of recurrent disease. Has had episodes of rectal spasming and pain and occasional vaginal bleeding. This started in the fall. Progress. CT scan concerning for pelvic mass between the vagina and the rectum. Seen by gynecology oncology in town since she lives in [place]. Mass affect rectovaginal septum felt protruding into vaginal cuff and protruding into rectum. Biopsy done in office last week. Pathology consistent with squamous mucosa inflammation. No evidence of dysplasia or malignancy. I was called because concerned by her gynecological oncologist that this may require surgery to treat. Patient normally has a bowel movement every day but has had some intermittent loose stools. Can get some crampy abdominal pain. Can be painful and have bowel movements pressure. No problems with urination. Normally can walk a half hour without much difficulty but lately has had issues with discomfort. No exertional chest pain or shortness of breath. She's not had any prior abdominal surgery. History colon polyps with screening colonoscopies done in the past. Chronic idiopathic neutropenia with some intermittent hormonal therapy done.   Other  Problems Mammie Lorenzo, LPN; 8/65/7846 96:29 PM) Back Pain Cancer Cholelithiasis Diabetes Mellitus Gastroesophageal Reflux Disease Hemorrhoids High blood pressure Oophorectomy Bilateral. Other disease, cancer, significant illness Ovarian Cancer Thyroid Disease  Past Surgical History Mammie Lorenzo, LPN; 12/28/4130 44:01 PM) Appendectomy Colon Polyp Removal - Colonoscopy Gallbladder Surgery - Laparoscopic Hysterectomy (due to cancer) - Complete  Diagnostic Studies History Mammie Lorenzo, LPN; 0/27/2536 64:40 PM) Colonoscopy within last year Mammogram within last year Pap Smear >5 years ago  Allergies Mammie Lorenzo, LPN; 3/47/4259 56:38 PM) Cipro *FLUOROQUINOLONES* Penicillins Swelling. Sulfa Antibiotics Rash. Trimethoprim Erythromycin *DERMATOLOGICALS* Adhesive Tape 1"x5yd *MEDICAL DEVICES AND SUPPLIES* Rash.  Medication History Mammie Lorenzo, LPN; 7/56/4332 95:18 PM) Hydrocod Polst-CPM Polst ER (10-8MG/5ML Liquid ER, Oral) Active. Benzonatate (100MG Capsule, Oral) Active. Crestor (10MG Tablet, Oral) Active. Hydrochlorothiazide (12.5MG Tablet, Oral) Active. Invokana (300MG Tablet, Oral) Active. Januvia (100MG Tablet, Oral) Active. MetFORMIN HCl (1000MG Tablet, Oral) Active. Synthroid (175MCG Tablet, Oral) Active. Valsartan (320MG Tablet, Oral) Active. Lovaza (1GM Capsule, Oral) Active. Colace (100MG Capsule, Oral) Active. Medications Reconciled  Social History Mammie Lorenzo, LPN; 8/41/6606 30:16 PM) Alcohol use Occasional alcohol use. Caffeine use Carbonated beverages, Coffee, Tea. No drug use Tobacco use Never smoker.  Family History Mammie Lorenzo, LPN; 0/05/9322 55:73 PM) Cancer Mother. Diabetes Mellitus Brother, Father. Heart Disease Brother, Father. Hypertension Brother, Father. Prostate Cancer Father.  Pregnancy / Birth History Mammie Lorenzo, LPN; 09/21/2540 70:62 PM) Age at menarche 50 years. Age of  menopause 67-50 Gravida 1 Maternal age 74-25 Para 1  Review of Systems Mammie Lorenzo LPN; 3/76/2831 51:76 PM) General Present- Appetite Loss and Fatigue. Not Present- Chills, Fever, Night Sweats, Weight Gain and Weight Loss. Skin Not Present- Change in Wart/Mole, Dryness, Hives, Jaundice, New Lesions, Non-Healing Wounds, Rash and Ulcer. HEENT Present- Ringing in the Ears and Wears  glasses/contact lenses. Not Present- Earache, Hearing Loss, Hoarseness, Nose Bleed, Oral Ulcers, Seasonal Allergies, Sinus Pain, Sore Throat, Visual Disturbances and Yellow Eyes. Respiratory Not Present- Bloody sputum, Chronic Cough, Difficulty Breathing, Snoring and Wheezing. Breast Not Present- Breast Mass, Breast Pain, Nipple Discharge and Skin Changes. Cardiovascular Present- Palpitations and Swelling of Extremities. Not Present- Chest Pain, Difficulty Breathing Lying Down, Leg Cramps, Rapid Heart Rate and Shortness of Breath. Gastrointestinal Present- Abdominal Pain, Bloating, Change in Bowel Habits, Gets full quickly at meals, Hemorrhoids and Rectal Pain. Not Present- Bloody Stool, Chronic diarrhea, Constipation, Difficulty Swallowing, Excessive gas, Indigestion, Nausea and Vomiting. Female Genitourinary Present- Pelvic Pain and Urgency. Not Present- Frequency, Nocturia and Painful Urination. Musculoskeletal Present- Back Pain. Not Present- Joint Pain, Joint Stiffness, Muscle Pain, Muscle Weakness and Swelling of Extremities. Neurological Not Present- Decreased Memory, Fainting, Headaches, Numbness, Seizures, Tingling, Tremor, Trouble walking and Weakness. Psychiatric Not Present- Anxiety, Bipolar, Change in Sleep Pattern, Depression, Fearful and Frequent crying. Endocrine Present- Hair Changes. Not Present- Cold Intolerance, Excessive Hunger, Heat Intolerance, Hot flashes and New Diabetes. Hematology Present- Easy Bruising. Not Present- Excessive bleeding, Gland problems, HIV and Persistent  Infections.   Vitals Claiborne Billings Dockery LPN; 11/11/8784 76:72 PM) 09/25/2014 12:21 PM Weight: 250 lb Height: 62in Body Surface Area: 2.23 m Body Mass Index: 45.73 kg/m Temp.: 98.78F  Pulse: 91 (Regular)  Resp.: 20 (Unlabored)  BP: 122/78 (Sitting, Left Arm, Standard)    Physical Exam [name]; 09/25/2014 12:38 PM) General Mental Status-Alert. General Appearance-Not in acute distress, Not Sickly. Orientation-Oriented X3. Hydration-Well hydrated. Voice-Normal.  Integumentary Global Assessment Upon inspection and palpation of skin surfaces of the - Axillae: non-tender, no inflammation or ulceration, no drainage. and Distribution of scalp and body hair is normal. General Characteristics Temperature - normal warmth is noted.  Head and Neck Head-normocephalic, atraumatic with no lesions or palpable masses. Face Global Assessment - atraumatic, no absence of expression. Neck Global Assessment - no abnormal movements, no bruit auscultated on the right, no bruit auscultated on the left, no decreased range of motion, non-tender. Trachea-midline. Thyroid Gland Characteristics - non-tender.  Eye Eyeball - Left-Extraocular movements intact, No Nystagmus. Eyeball - Right-Extraocular movements intact, No Nystagmus. Cornea - Left-No Hazy. Cornea - Right-No Hazy. Sclera/Conjunctiva - Left-No scleral icterus, No Discharge. Sclera/Conjunctiva - Right-No scleral icterus, No Discharge. Pupil - Left-Direct reaction to light normal. Pupil - Right-Direct reaction to light normal. Note: Moderate RIGHT upper eyelid droop. Extraocular movements intact and symmetrical.   ENMT Ears Pinna - Left - no drainage observed, no generalized tenderness observed. Right - no drainage observed, no generalized tenderness observed. Nose and Sinuses External Inspection of the Nose - no destructive lesion observed. Inspection of the nares - Left - quiet  respiration. Right - quiet respiration. Mouth and Throat Lips - Upper Lip - no fissures observed, no pallor noted. Lower Lip - no fissures observed, no pallor noted. Nasopharynx - no discharge present. Oral Cavity/Oropharynx - Tongue - no dryness observed. Oral Mucosa - no cyanosis observed. Hypopharynx - no evidence of airway distress observed.  Chest and Lung Exam Inspection Movements - Normal and Symmetrical. Accessory muscles - No use of accessory muscles in breathing. Palpation Palpation of the chest reveals - Non-tender. Auscultation Breath sounds - Normal and Clear.  Cardiovascular Auscultation Rhythm - Regular. Murmurs & Other Heart Sounds - Auscultation of the heart reveals - No Murmurs and No Systolic Clicks.  Abdomen Inspection Inspection of the abdomen reveals - No Visible peristalsis and No Abnormal pulsations. Umbilicus -  No Bleeding, No Urine drainage. Palpation/Percussion Palpation and Percussion of the abdomen reveal - Soft, Non Tender, No Rebound tenderness, No Rigidity (guarding) and No Cutaneous hyperesthesia. Note: Obese but soft. No diastases. No umbilical hernia. Mild tenderness to palpation in the RIGHT suprapubic region. No inguinal hernia. No guarding. Mild panniculus. Good hygiene.   Female Genitourinary Sexual Maturity Tanner 5 - Adult hair pattern. Note: No vaginal bleeding nor discharge. At apex of the vaginal cuff on the RIGHT side 1 cm tender firm and bulging. Fixed to her rectovaginal septum. No definite ulcerations   Rectal Note: No pruritus. No pilonidal disease. Small RIGHT anterior and LEFT lateral external hemorrhoid skin tags. Normal sphincter tone. Rectum soft. Firm mass pushing into RIGHT anterior rectal wall. No ulcerations seen or felt. Mucosa mobile over this. Fixed. Tender/sensitive. 6 cm from anal verge. Involving about a quarter of the circumference. Anoscopy done. No ulceration seen.   Peripheral Vascular Upper  Extremity Inspection - Left - No Cyanotic nailbeds, Not Ischemic. Right - No Cyanotic nailbeds, Not Ischemic.  Neurologic Neurologic evaluation reveals -normal attention span and ability to concentrate, able to name objects and repeat phrases. Appropriate fund of knowledge , normal sensation and normal coordination. Mental Status Affect - not angry, not paranoid. Cranial Nerves-Normal Bilaterally. Gait-Normal.  Neuropsychiatric Mental status exam performed with findings of-able to articulate well with normal speech/language, rate, volume and coherence, thought content normal with ability to perform basic computations and apply abstract reasoning and no evidence of hallucinations, delusions, obsessions or homicidal/suicidal ideation.  Musculoskeletal Global Assessment Spine, Ribs and Pelvis - no instability, subluxation or laxity. Right Upper Extremity - no instability, subluxation or laxity.  Lymphatic Head & Neck  General Head & Neck Lymphatics: Bilateral - Description - No Localized lymphadenopathy. Axillary  General Axillary Region: Bilateral - Description - No Localized lymphadenopathy. Femoral & Inguinal  Generalized Femoral & Inguinal Lymphatics: Left - Description - No Localized lymphadenopathy. Right - Description - No Localized lymphadenopathy.    Results [name]; 09/25/2014 1:18 PM) Procedures  Name Value Date Hemorrhoids Procedure Anal exam: External Hemorrhoid Skin tag Internal exam: Mass Other: Bulky fixed rectovaginal septal mass pushing into RIGHT anterior rectum. Distal and about 6 cm from anal verge. Please see rectal exam.  Performed: 09/25/2014 12:38 PM   Assessment & Plan [name]; 09/25/2014 1:18 PM) PELVIC MASS IN FEMALE (789.30  R19.00) Impression: Firm fixed pelvic mass in proximal/mid rectovaginal septum adherent to the cuff of vagina and pushing into anterior rectal wall. CT scan exam suspicious for  cancer recurrence. Doubt rectal adenocarcinoma etiology.  It is reasonable to get endoscopic ultrasound rectally for biopsy of this mass since vaginal biops. See if Dr. Paulita Fujita can do that for [facility]. Other possibility would be CT-guided biopsy but endorectal ultrasound will be helpful to get a sense of invasion into the rectum or not.  If it is cancer recurrence, defer to Dr. Denman [name] on management ultimately. She is understandably concerned given the size that radiation therapy by external beam and brachytherapy may be inadequate for this. Sounds like the patient was told she had a drop met there before on the oringinal surgery.  Dr. Serita Grit concern that resection of this mass may combine partial vaginectomy with combined low anterior resection. I suspect she may be right. It is about 6 cm from the anal verge. Anastomosis certainly probable and not need for permanent colostomy. However would need to temporary diverting loop ileostomy to protect the low colorectal  anastomosis. I'll defer to Dr. Denman [name] and further workup to figure out plan of need of neoadjuvant chemoradiation therapy versus primary resection first.  Usually neoadjuvant chemoradiation therapy is helpful to help shrink the tumor and make resection more easy. This works well for colon adenocarcinoma. We'll have to defer to her on if useful in recurrent ovarian/endometrial cancer. Most likely would discuss at GI tumor Board. She may wish to discuss at [name] tumor board first.  If he comes to concomittent low anterior resection, would like to try and do a minimally invasive robotic approach and minimize the need for a larger open incision. May complete the final resection in an open fashion per Dr. Denman [name] depending what she feels. Combined case. Dr. Denman [name] did caution this may go back to [facility] for combined pelvic surgery.  PLAN TO DO LAR WITH COMPLETION VAGINECTOMY TO GET RECURRENT PELVIC MASS OUT  Current Plans  ANOSCOPY, DIAGNOSTIC  (24097) Instructed to make follow-up appointment for office visit following completion of diagnostic tests The anatomy & physiology of the digestive tract was discussed. The pathophysiology of the rectal pathology was discussed. Natural history risks without surgery was discussed. I worked to give an overview of the disease and the frequent need to have multispecialty involvement. I feel the risks of no intervention will lead to serious problems that outweigh the operative risks; therefore, I recommended a partial proctocolectomy to remove the pathology. Minimally Invasive (Robotic/Laparoscopic) & open techniques were discussed. We will work to preserve anal & pelvic floor function without sacrificing cure.  Risks such as bleeding, infection, abscess, leak, reoperation, possible temporary or permanent ostomy, hernia, heart attack, death, and other risks were discussed. I noted a good likelihood this will help address the problem. Goals of post-operative recovery were discussed as well. We will work to minimize complications. Educational information was available as well. Questions were answered. The patient expresses understanding & wishes to proceed with surgery. Pt Education - [facility] Good Bowel Health (Kaiel Weide) Pt Education - [facility] Ostomy HCI (Mattison Stuckey) Pt Education - [facility] Abdominal Surgery (Flynn Gwyn)  [name], M.D., F.A.C.S. Gastrointestinal and Minimally Invasive Surgery [facility] [address] [place], [place] [address] [phone] Main / Paging

## 2014-11-20 ENCOUNTER — Inpatient Hospital Stay (HOSPITAL_COMMUNITY): Payer: BLUE CROSS/BLUE SHIELD

## 2014-11-20 ENCOUNTER — Encounter (HOSPITAL_COMMUNITY): Payer: Self-pay | Admitting: Gynecologic Oncology

## 2014-11-20 DIAGNOSIS — D72829 Elevated white blood cell count, unspecified: Secondary | ICD-10-CM

## 2014-11-20 DIAGNOSIS — C541 Malignant neoplasm of endometrium: Principal | ICD-10-CM

## 2014-11-20 DIAGNOSIS — D630 Anemia in neoplastic disease: Secondary | ICD-10-CM

## 2014-11-20 DIAGNOSIS — Z978 Presence of other specified devices: Secondary | ICD-10-CM | POA: Insufficient documentation

## 2014-11-20 DIAGNOSIS — R1900 Intra-abdominal and pelvic swelling, mass and lump, unspecified site: Secondary | ICD-10-CM

## 2014-11-20 DIAGNOSIS — J9601 Acute respiratory failure with hypoxia: Secondary | ICD-10-CM

## 2014-11-20 DIAGNOSIS — C569 Malignant neoplasm of unspecified ovary: Secondary | ICD-10-CM

## 2014-11-20 DIAGNOSIS — D708 Other neutropenia: Secondary | ICD-10-CM

## 2014-11-20 DIAGNOSIS — Z789 Other specified health status: Secondary | ICD-10-CM

## 2014-11-20 LAB — BLOOD GAS, ARTERIAL
ACID-BASE DEFICIT: 2.4 mmol/L — AB (ref 0.0–2.0)
BICARBONATE: 23.2 meq/L (ref 20.0–24.0)
Drawn by: 31814
FIO2: 0.3 %
LHR: 12 {breaths}/min
MECHVT: 450 mL
O2 Saturation: 97.6 %
PATIENT TEMPERATURE: 97.8
PCO2 ART: 45.2 mmHg — AB (ref 35.0–45.0)
PEEP: 5 cmH2O
TCO2: 21.6 mmol/L (ref 0–100)
pH, Arterial: 7.327 — ABNORMAL LOW (ref 7.350–7.450)
pO2, Arterial: 104 mmHg — ABNORMAL HIGH (ref 80.0–100.0)

## 2014-11-20 LAB — MAGNESIUM: MAGNESIUM: 1.1 mg/dL — AB (ref 1.5–2.5)

## 2014-11-20 LAB — GLUCOSE, CAPILLARY
GLUCOSE-CAPILLARY: 132 mg/dL — AB (ref 70–99)
Glucose-Capillary: 116 mg/dL — ABNORMAL HIGH (ref 70–99)
Glucose-Capillary: 157 mg/dL — ABNORMAL HIGH (ref 70–99)
Glucose-Capillary: 199 mg/dL — ABNORMAL HIGH (ref 70–99)
Glucose-Capillary: 194 mg/dL — ABNORMAL HIGH (ref 70–99)
Glucose-Capillary: 229 mg/dL — ABNORMAL HIGH (ref 70–99)
Glucose-Capillary: 165 mg/dL — ABNORMAL HIGH (ref 70–99)
Glucose-Capillary: 151 mg/dL — ABNORMAL HIGH (ref 70–99)

## 2014-11-20 LAB — BASIC METABOLIC PANEL
Anion gap: 8 (ref 5–15)
BUN: 18 mg/dL (ref 6–23)
CALCIUM: 7.8 mg/dL — AB (ref 8.4–10.5)
CHLORIDE: 105 mmol/L (ref 96–112)
CO2: 23 mmol/L (ref 19–32)
CREATININE: 0.81 mg/dL (ref 0.50–1.10)
GFR calc Af Amer: 90 mL/min — ABNORMAL LOW (ref >90)
GFR calc non Af Amer: 77 mL/min — ABNORMAL LOW (ref >90)
GLUCOSE: 223 mg/dL — AB (ref 70–99)
Potassium: 4.1 mmol/L (ref 3.5–5.1)
Sodium: 136 mmol/L (ref 135–145)

## 2014-11-20 LAB — CBC
HCT: 34.3 % — ABNORMAL LOW (ref 36.0–46.0)
Hemoglobin: 10.9 g/dL — ABNORMAL LOW (ref 12.0–15.0)
MCH: 27.2 pg (ref 26.0–34.0)
MCHC: 31.8 g/dL (ref 30.0–36.0)
MCV: 85.5 fL (ref 78.0–100.0)
PLATELETS: 274 10*3/uL (ref 150–400)
RBC: 4.01 MIL/uL (ref 3.87–5.11)
RDW: 16.7 % — ABNORMAL HIGH (ref 11.5–15.5)
WBC: 20.2 10*3/uL — AB (ref 4.0–10.5)

## 2014-11-20 MED ORDER — SODIUM CHLORIDE 0.9 % IJ SOLN: 9.0000 mL | INTRAMUSCULAR | Status: DC | PRN

## 2014-11-20 MED ORDER — DIPHENHYDRAMINE HCL 50 MG/ML IJ SOLN: 12.5000 mg | Freq: Four times a day (QID) | INTRAMUSCULAR | Status: DC | PRN

## 2014-11-20 MED ORDER — ONDANSETRON HCL 4 MG/2ML IJ SOLN
4.0000 mg | Freq: Four times a day (QID) | INTRAMUSCULAR | Status: DC | PRN
  Administered 2014-11-20: 4 mg via INTRAVENOUS
  Filled 2014-11-20: qty 2

## 2014-11-20 MED ORDER — HYDROMORPHONE 0.3 MG/ML IV SOLN
INTRAVENOUS | Status: DC
  Administered 2014-11-20: 0.3 mg via INTRAVENOUS
  Administered 2014-11-20: 22:00:00 via INTRAVENOUS
  Administered 2014-11-20: 3.6 mg via INTRAVENOUS
  Administered 2014-11-20: 12:00:00 via INTRAVENOUS
  Administered 2014-11-21: 1.8 mg via INTRAVENOUS
  Administered 2014-11-21: 1.2 mg via INTRAVENOUS
  Administered 2014-11-21: 12:00:00 via INTRAVENOUS
  Administered 2014-11-21: 0.9 mg via INTRAVENOUS
  Administered 2014-11-21: 0.3 mg via INTRAVENOUS
  Administered 2014-11-21: 4.8 mg via INTRAVENOUS
  Administered 2014-11-21: 3 mg via INTRAVENOUS
  Administered 2014-11-22: 06:00:00 via INTRAVENOUS
  Administered 2014-11-22: 0.3 mg via INTRAVENOUS
  Administered 2014-11-22 (×2): 1.5 mg via INTRAVENOUS
  Filled 2014-11-20 (×4): qty 25

## 2014-11-20 MED ORDER — DIPHENHYDRAMINE HCL 12.5 MG/5ML PO ELIX: 12.5000 mg | ORAL_SOLUTION | Freq: Four times a day (QID) | ORAL | Status: DC | PRN

## 2014-11-20 MED ORDER — FENTANYL BOLUS VIA INFUSION
50.0000 ug | INTRAVENOUS | Status: DC | PRN
  Filled 2014-11-20: qty 100

## 2014-11-20 MED ORDER — HYDROMORPHONE HCL 1 MG/ML IJ SOLN
0.5000 mg | INTRAMUSCULAR | Status: DC | PRN
  Administered 2014-11-20: 2 mg via INTRAVENOUS
  Administered 2014-11-22: 1 mg via INTRAVENOUS
  Administered 2014-11-22: 0.5 mg via INTRAVENOUS
  Administered 2014-11-22: 1 mg via INTRAVENOUS
  Administered 2014-11-23: 2 mg via INTRAVENOUS
  Administered 2014-11-24: 0.5 mg via INTRAVENOUS
  Filled 2014-11-20: qty 2
  Filled 2014-11-20 (×2): qty 1
  Filled 2014-11-20: qty 2
  Filled 2014-11-20 (×2): qty 1

## 2014-11-20 MED ORDER — ONDANSETRON HCL 4 MG/2ML IJ SOLN: 4.0000 mg | Freq: Four times a day (QID) | INTRAMUSCULAR | Status: DC | PRN

## 2014-11-20 MED ORDER — INSULIN ASPART 100 UNIT/ML ~~LOC~~ SOLN
0.0000 [IU] | SUBCUTANEOUS | Status: DC
  Administered 2014-11-20: 7 [IU] via SUBCUTANEOUS
  Administered 2014-11-20 (×2): 4 [IU] via SUBCUTANEOUS
  Administered 2014-11-20: 7 [IU] via SUBCUTANEOUS
  Administered 2014-11-20 – 2014-11-21 (×2): 4 [IU] via SUBCUTANEOUS
  Administered 2014-11-21 (×2): 3 [IU] via SUBCUTANEOUS
  Administered 2014-11-21: 4 [IU] via SUBCUTANEOUS
  Administered 2014-11-22 (×2): 3 [IU] via SUBCUTANEOUS
  Administered 2014-11-22: 4 [IU] via SUBCUTANEOUS
  Administered 2014-11-22: 3 [IU] via SUBCUTANEOUS
  Administered 2014-11-23: 4 [IU] via SUBCUTANEOUS
  Administered 2014-11-23 (×2): 3 [IU] via SUBCUTANEOUS

## 2014-11-20 MED ORDER — NALOXONE HCL 0.4 MG/ML IJ SOLN: 0.4000 mg | INTRAMUSCULAR | Status: DC | PRN

## 2014-11-20 MED ORDER — FENTANYL 10 MCG/ML IV SOLN
INTRAVENOUS | Status: DC
  Filled 2014-11-20: qty 50

## 2014-11-20 MED ORDER — MAGNESIUM SULFATE 4 GM/100ML IV SOLN
4.0000 g | Freq: Once | INTRAVENOUS | Status: AC
  Administered 2014-11-20: 4 g via INTRAVENOUS
  Filled 2014-11-20: qty 100

## 2014-11-20 NOTE — Progress Notes (Signed)
PULMONARY / CRITICAL CARE MEDICINE   Name: [name] MRN: [id number] DOB: 1955/10/09    ADMISSION DATE:  11/19/2014 CONSULTATION DATE:  11/20/2014  REFERRING MD :  Denman [name]   CHIEF COMPLAINT:  Endometrioid adenocarcinoma  INITIAL PRESENTATION:   59 y.o. F with hx of endometrial and ovarian CA s/p TAH and BSO in 2006, evaluated for abd pain, rectal spasm, and occasional vaginal bleeding.  CT revealed large right pelvic mass and transrectal [place] and biopsy revealed endometrioid adenocarcinoma.  Underwent resection on 4/19 and was kept on ventilator overnight.  PCCM consulted for vent management.   STUDIES:  CT A/P 09/12/14 >>> new right pelvic mass abutting the vaginal cuff and rectum, high worrisome for recurrent ovarian CA.  Rectal lesion cannot be excluded.  No other evidence of metastatic disease demonstrated. MRI pelvis 10/14/14 >>> 5.3 cm multilobulated soft tissue mass in the hysterectomy bed c/w recurrent carcinoma. PET [id number] >>> hypermetabolic pelvic soft tissue mass involving the vaginal cuff and pelvic cul-de-sac c/w recurrent carcinoma.  No evidence of pelvic lymph node or distant metastatic disease.  SIGNIFICANT EVENTS: 09/20/14 - vaginal biopsy with squamous mucosa without dysplasia or malignancy identified 11/19/14 - admitted and underwent robotic-assisted lysis of adhesions, converted to laparotomy, radical upper vaginectomy and low anterior resection with colostomy. Bilateral ureteral stent placement and cystotomy repair   SUBJECTIVE:  Tolerating PSV  VITAL SIGNS: Temp:  [97.8 F (36.6 C)-98.3 F (36.8 C)] 98.3 F (36.8 C) (04/20 0400) Pulse Rate:  [80-109] 93 (04/20 0715) Resp:  [12-21] 13 (04/20 0715) BP: (82-142)/(30-94) 108/45 mmHg (04/20 0715) SpO2:  [97 %-100 %] 99 % (04/20 0715) Arterial Line BP: (67-141)/(32-75) 112/56 mmHg (04/20 0615) FiO2 (%):  [30 %-100 %] 30 % (04/20 0715) Weight:  [113.671 kg (250 lb 9.6 oz)-118 kg (260 lb 2.3 oz)] 118 kg (260 lb 2.3 oz)  (04/20 0400) HEMODYNAMICS:   VENTILATOR SETTINGS: Vent Mode:  [-] PSV;CPAP FiO2 (%):  [30 %-100 %] 30 % Set Rate:  [12 bmp] 12 bmp Vt Set:  [450 mL-600 mL] 450 mL PEEP:  [5 cmH20] 5 cmH20 Pressure Support:  [5 cmH20-8 cmH20] 5 cmH20 Plateau Pressure:  [14 cmH20-20 cmH20] 16 cmH20 INTAKE / OUTPUT: Intake/Output      04/19 0701 - 04/20 0700 04/20 0701 - 04/21 0700   I.V. (mL/kg) 7050.4 (59.7)    Blood 700    IV Piggyback 800    Total Intake(mL/kg) 8550.4 (72.5)    Urine (mL/kg/hr) 1620    Drains 175    Blood 1200    Total Output 2995     Net +5555.4            PHYSICAL EXAMINATION: General: obese female awake, comfortable on vent Neuro: RASS 0, alert, shakes/nods head appropriately HEENT: [place]/AT, PERRL, no JVD noted Cardiovascular: RRR, no MRG Lungs: Clear bilateral breath sounds, synchronous with vent Abdomen: Soft, generalized tenderness, non-distended. RLQ JP with bloody drainage, LLQ colostomy Musculoskeletal: No acute deformity, ROM limitation, or edema Skin: Grossly intact   LABS:  CBC  Recent Labs Lab 11/18/14 0821  11/19/14 1722 11/19/14 2115 11/20/14 0515  WBC 11.0*  --   --  40.0* 20.2*  HGB 13.1  < > 11.9* 11.6* 10.9*  HCT 41.5  < > 35.0* 36.0 34.3*  PLT 286  --   --  246 274  < > = values in this interval not displayed. Coag's No results for input(s): APTT, INR in the last 168 hours. BMET  Recent Labs Lab 11/19/14  1722 11/19/14 2115 11/20/14 0515  NA 136 138 136  K 4.2 4.1 4.1  CL  --  107 105  CO2  --  22 23  BUN  --  17 18  CREATININE  --  0.71 0.81  GLUCOSE  --  173* 223*   Electrolytes  Recent Labs Lab 11/19/14 2115 11/20/14 0515  CALCIUM 7.9* 7.8*  MG  --  1.1*   Sepsis Markers No results for input(s): LATICACIDVEN, PROCALCITON, O2SATVEN in the last 168 hours. ABG  Recent Labs Lab 11/19/14 1934 11/19/14 2222 11/20/14 0416  PHART 7.331* 7.308* 7.327*  PCO2ART 40.8 44.3 45.2*  PO2ART 464.0* 162.0* 104.0*   Liver  Enzymes No results for input(s): AST, ALT, ALKPHOS, BILITOT, ALBUMIN in the last 168 hours. Cardiac Enzymes No results for input(s): TROPONINI, PROBNP in the last 168 hours. Glucose  Recent Labs Lab 11/19/14 0808 11/19/14 1158 11/20/14 0112 11/20/14 0358  GLUCAP 129* 116* 199* 194*    Imaging No results found.    ASSESSMENT / PLAN:  HEMATOLOGIC / ONCOLOGIC A:   Endometrioid adenocarcinoma -  s/p Robotic-assisted lysis of adhesions, radical upper vaginectomy and low anterior resection with colostomy. Bilateral ureteral stent placement and cystotomy repair on 11/19/14 Hx endometrial and ovarian CA s/p TAH and BSO in 2006 - now with recurrence Mild anemia - received blood products during case Chronic neutropenia - presumed related to previous chemo.  Now normal on this admission.  Follows with hematology as outpatient ([place]) VTE Prophylaxis P:  Post op care per GYN - ONC and CCS. Transfuse per usual ICU guidelines. SCD's  CBC in AM. [place] heparin starting 4/20  PULMONARY OETT 4/19 >>>4/20 A: VDRF Hx OSA PCXR w/ low volume. Passed SBT  P:   Extubate 4/20 Wean O2 IS  OOB   CARDIOVASCULAR A:  Hx essential HTN Hypotension - suspect medication related P:  Tele monitoring MAP goal > 77mm/Hg Will adjust sedation medications. Wean neo to off.   RENAL A:   Hypomagnesemia   P:   Cont IVF  Replace and recheck lytes.   GASTROINTESTINAL A:   GERD Nutrition P:   Pantoprazole. Adv diet   INFECTIOUS A:   No indication for active infection P:   Clinda and gentamycin as surgical ppx 4/19 Monitor clinically.  ENDOCRINE A:   DM II Hypothyroidism P:   CBG's q4hr. SSI. Resume outpatient levothyroxine 4/20 Hold outpatient canagliflozin, metformin, sitagliptin,   NEUROLOGIC A:   Acute metabolic encephalopathy-->resolved Hx Depression P:   PRN analgesia  Low dose fentanyl Percocet when taking orals    Interdisciplinary Family Meeting v  Palliative Care Meeting:  Due by: 4/26   Looks good. No distress. Passed SBT. Have written orders for extubation. Will defer diet to surgical team. From pulm goal focus will be to maximize pulm hygiene measures and mobilize early. Hope we can get her off the Neo gtt this am.   Erick Colace ACNP-BC [place] Pager # [phone] OR # [phone] if no answer 11/20/2014 8:17 AM   Attending Note:  I have examined patient, reviewed labs, studies and notes. I have discussed the case with [name], and I agree with the data and plans as amended above. Pt is s/p extensive surgical resection of recurrent endometrial adenoCA, requiring radical vaginectomy, colostomy, B ureteral stent placement. remained intubated overnight, now tolerating PSV, passed SBT. Will plan to extubate, restart synthroid, transition maintenance meds to PO when OK with GynOnc and CCS.  Independent critical care  time is 45 minutes.   Baltazar Apo, MD, PhD 11/20/2014, 9:08 AM [facility] [phone] or if no answer [phone]

## 2014-11-20 NOTE — Progress Notes (Signed)
1 Day Post-Op Procedure(s) (LRB): ROBOTIC LYSIS OF ADHESIONS, CONVERTED TO LAPAROTOMY RADICAL UPPER VAGINECTOMY,LOW ANTERIOR BOWEL RESECTION, COLOSTOMY, BILATERAL URETERAL STENT PLACEMENT AND CYSTONOMY CLOSURE (N/A) XI ROBOTIC ASSISTED LOWER ANTERIOR RESECTION (N/A) OSTOMY (N/A) Events overnight/24 hrs: came out of surgery intubated (as precaution due to long surgery and large EBL, concern for fluid shifts). Did well overnight with relatively stable BP (mild hypotension) not on cardiac pressors.  Subjective: Patient reports unable to verbalize secondary to ventilator/ETT but communicates by writing. Denies pain. .    Objective: Vital signs in last 24 hours: Temp:  [97.8 F (36.6 C)-98.3 F (36.8 C)] 98.3 F (36.8 C) (04/20 0400) Pulse Rate:  [80-109] 103 (04/20 0615) Resp:  [12-21] 20 (04/20 0615) BP: (82-142)/(30-94) 93/44 mmHg (04/20 0419) SpO2:  [97 %-100 %] 99 % (04/20 0615) Arterial Line BP: (67-141)/(32-75) 112/56 mmHg (04/20 0615) FiO2 (%):  [30 %-100 %] 30 % (04/20 0419) Weight:  [250 lb 9.6 oz (113.671 kg)-260 lb 2.3 oz (118 kg)] 260 lb 2.3 oz (118 kg) (04/20 0400)    Intake/Output from previous day: 04/19 0701 - 04/20 0700 In: 8246.6 [I.V.:6746.6; Blood:700; IV Piggyback:800] Out: 2995 [Urine:1620; Drains:175; Blood:1200]  Physical Examination:  General: alert and cooperative Resp: clear to auscultation bilaterally Cardio: regular rate and rhythm, S1, S2 normal, no murmur, click, rub or gallop GI: soft, non-tender; bowel sounds normal; no masses,  no organomegaly and incision: clean, intact and bloody and small volume drainage present Extremities: extremities normal, atraumatic, no cyanosis or edema Vaginal Bleeding: none  Colostomy: pink, viable, bag sweat only (no stool or gas). Drain from RLQ (in pelvis): serosanguinous output. Ventilator: PRVC 12x450, PS 5, Fi O2: 30%.   Labs: WBC/Hgb/Hct/Plts:  20.2/10.9/34.3/274 (04/20 0515) BUN/Cr/glu/ALT/AST/amyl/lip:   18/0.81/--/--/--/--/-- (04/20 0515)   BMET    Component Value Date/Time   NA 136 11/20/2014 0515   NA 141 09/12/2014 0857   K 4.1 11/20/2014 0515   K 4.0 09/12/2014 0857   CL 105 11/20/2014 0515   CO2 23 11/20/2014 0515   CO2 28 09/12/2014 0857   GLUCOSE 223* 11/20/2014 0515   GLUCOSE 121 09/12/2014 0857   BUN 18 11/20/2014 0515   BUN 15.4 09/12/2014 0857   CREATININE 0.81 11/20/2014 0515   CREATININE 0.7 09/12/2014 0857   CREATININE 0.84 08/25/2014 1538   CALCIUM 7.8* 11/20/2014 0515   CALCIUM 9.5 09/12/2014 0857   GFRNONAA 77* 11/20/2014 0515   GFRNONAA 76 08/25/2014 1538   GFRAA 90* 11/20/2014 0515   GFRAA 87 08/25/2014 1538     ABG    Component Value Date/Time   PHART 7.327* 11/20/2014 0416   PCO2ART 45.2* 11/20/2014 0416   PO2ART 104.0* 11/20/2014 0416   HCO3 23.2 11/20/2014 0416   TCO2 21.6 11/20/2014 0416   ACIDBASEDEF 2.4* 11/20/2014 0416   O2SAT 97.6 11/20/2014 0416     Assessment:  59 y.o. s/p Procedure(s): ROBOTIC LYSIS OF ADHESIONS, CONVERTED TO LAPAROTOMY RADICAL UPPER VAGINECTOMY,LOW ANTERIOR BOWEL RESECTION, COLOSTOMY, BILATERAL URETERAL STENT PLACEMENT AND CYSTONOMY CLOSURE XI ROBOTIC ASSISTED LOWER ANTERIOR RESECTION OSTOMY: stable Pain:  Pain is well-controlled on prn medications.  Pulm: Currently intubated as postop precaution secondary to long case and high EBL. Recommend checking weaning parameters and extubating this morning.  Heme:Anemia: appropriate blood loss anemia secondary to surgical losses. Post op Hb is appropriate today. No evidence for ongoing bleeding. S/p t/f 2 units intraop.  ID: no issues. Remove Incision wound wicks (x4) on POD 3  CV: Hypotension: mild, and with mild acidosis on  ABG, likely secondary to volume contraction.  begin routine home BP meds when BP supports their introduction.[name] Kitchen  GI:  Tolerating po: No: intubated. Recommend introducing clears today after extubation.   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  GU: patient had complex cystotomy repair (trigone and base) and ureteral stenting. DO NOT REMOVE FOLEY CATHETER - will perform retrograde cystogram at 2-3 weeks and will d.c foley at that time IF no leak noted. Will check JP creatinine prior to removing drain (if elevated above serum levels would keep JP in until foley comes out/bladder integrity confirmed on imaging). Remove ureteral stents at 6 weeks if no stricture/leak on CT urogram.  Endo: Diabetes mellitus Type II, under good control..  CBG: continue sliding scale.  Oncology/GYN: recurrent endometrial cancer. Complete resection, margin status pending. Recommend adjuvant radiation after patient's GU system has adequately healed. Prognosis is overall good (curative intent therapy).  Prophylaxis: pharmacologic prophylaxis (with any of the following: unfractionated SQ heparin 5000 units 2 hours prior to surgery then every 12 hours).  Plan: Advance diet Encourage ambulation Advance to PO medication Clear liquids  Extubate Transfer to regular surgical floor bed Continue foley for at least 2-3 weeks (pending imaging results). Continue JP drain. Dispo:  Discharge plan to include: eventual discharge to home (anticipate POD 5). The patient is to be discharged to home.   LOS: 1 day    Donaciano Eva 11/20/2014, 6:41 AM

## 2014-11-20 NOTE — Progress Notes (Addendum)
[address] [place], [place] [address] Phone: [phone] FAX: [phone]    [name] [id number] Nov 13, 1955  CARE TEAM:  PCP: Leana Gamer., MD  Outpatient Care Team: Patient Care Team: Leana Gamer, MD as PCP - General (Internal Medicine) [name], MD as Referring Physician (Obstetrics and Gynecology) Heath Lark, MD as Consulting Physician (Hematology and Oncology) Everitt Amber, MD as Consulting Physician (Obstetrics and Gynecology)  Inpatient Treatment Team: Treatment Team: Attending Provider: Everitt Amber, MD; Physician Assistant: Rondel Jumbo, PA-C; Consulting Physician: Heath Lark, MD; Consulting Physician: Md Pccm, MD  Problem List:   Active Problems:   History of ovarian cancer   Rectal mass   Pelvic mass in female   1 Day Post-Op  Procedure(s): POST-OPERATIVE DIAGNOSIS: 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  PROCEDURE:  LAPAROSCOPIC/ROBOTIC LYSIS OF ADHESIONS*^ EXPLORATORY LAPAROTOMY^ RESECTION OF PELVIC MASS^ RADICAL UPPER VAGINECTOMY^ LOW ANTERIOR RECTOSIGMOID RESECTION* END DESCENDING COLOSTOMY* BILATERAL URETERAL STENT PLACEMENT^ CYSTOTOMY CLOSURE^  SURGEON: : Everitt Amber, MD^ Michael [name], MD*  ASSISTANT: Lahoma Crocker, MD  Assessment  Stabilizing  Plan:  Extubate  Stepdown status later today, floor tomorrow if improves & comes off Neo gtt  Low Ma - replace  Pain control - prob will need PCA.  Fent OK - inc PRN dose for now  Improve glc control - resistent SSI  Colostomy care  Foley - DO NOT REMOVE due to bladder repair & ureteral stenting.  She had complex cystotomy repair (trigone and base) and ureteral stenting. DO NOT REMOVE FOLEY CATHETER - will perform retrograde cystogram at 2-3 weeks and will d.c foley at that time IF no leak noted. Will check JP creatinine prior to removing drain (if elevated above serum levels would keep JP in until foley  comes out/bladder integrity confirmed on imaging). Remove ureteral stents at 6 weeks if no stricture/leak on CT urogram.  F/u pathology  -VTE prophylaxis- SCDs, etc  -mobilize as tolerated to help recovery  I updated the patient's status to the patient & ICU RNs.  Recommendations were made.  Questions were answered.  The patient expressed understanding & appreciation.   [name], M.D., F.A.C.S. Gastrointestinal and Minimally Invasive Surgery [facility] [address], [place], [place] [address] [phone] Main / Paging   11/20/2014  Subjective:  Awake on ventilator.  Hoping to be extubated.  Some pain but mostly controlled with fentanyl.  Not perfect.  Objective:  Vital signs:  Filed Vitals:   11/20/14 0530 11/20/14 0545 11/20/14 0600 11/20/14 0615  BP:      Pulse: 97 96 95 103  Temp:      TempSrc:      Resp: 15 16 14 20   Height:      Weight:      SpO2: 98% 98% 98% 99%       Intake/Output   Yesterday:  04/19 0701 - 04/20 0700 In: 8550.4 [I.V.:7050.4; Blood:700; IV Piggyback:800] Out: 2995 [Urine:1620; Drains:175; Blood:1200] This shift:     Bowel function:  Flatus: n  BM: n  Drain: serosanguinous  Physical Exam:  General: Pt awake/alert/oriented in no acute distress Eyes: PERRL, normal EOM.  Sclera clear.  No icterus Neuro: CN II-XII intact w/o focal sensory/motor deficits. Lymph: No head/neck/groin lymphadenopathy Psych:  No delerium/psychosis/paranoia HENT: Normocephalic, Mucus membranes moist.  No thrush.  ETT in place Neck: Supple, No tracheal deviation Chest: No chest wall pain w good excursion CV:  Pulses intact.  Regular rhythm MS: Normal AROM mjr joints.  No obvious deformity Abdomen: Soft.  Nondistended.  Dressings w old blood.  Mildly tender at incisions only.  No evidence of peritonitis.  No incarcerated hernias. Ext:  SCDs BLE.  No mjr edema.  No cyanosis Skin: No petechiae /  purpura  Results:   Labs: Results for orders placed or performed during the hospital encounter of 11/19/14 (from the past 48 hour(s))  Glucose, capillary     Status: Abnormal   Collection Time: 11/19/14  8:08 AM  Result Value Ref Range   Glucose-Capillary 129 (H) 70 - 99 mg/dL  Type and screen     Status: None (Preliminary result)   Collection Time: 11/19/14  8:32 AM  Result Value Ref Range   ABO/RH(D) A POS    Antibody Screen NEG    Sample Expiration 11/22/2014    Unit Number [id number]    Blood Component Type RED CELLS,LR    Unit division 00    Status of Unit ISSUED    Transfusion Status OK TO TRANSFUSE    Crossmatch Result Compatible    Unit Number [id number]    Blood Component Type RED CELLS,LR    Unit division 00    Status of Unit ISSUED    Transfusion Status OK TO TRANSFUSE    Crossmatch Result Compatible    Unit Number [id number]    Blood Component Type RED CELLS,LR    Unit division 00    Status of Unit ALLOCATED    Transfusion Status OK TO TRANSFUSE    Crossmatch Result Compatible    Unit Number [id number]    Blood Component Type RED CELLS,LR    Unit division 00    Status of Unit ALLOCATED    Transfusion Status OK TO TRANSFUSE    Crossmatch Result Compatible   ABO/Rh     Status: None   Collection Time: 11/19/14  8:32 AM  Result Value Ref Range   ABO/RH(D) A POS   I-STAT 7, (LYTES, BLD GAS, ICA, H+H)     Status: Abnormal   Collection Time: 11/19/14 10:48 AM  Result Value Ref Range   pH, Arterial 7.391 7.350 - 7.450   pCO2 arterial 38.3 35.0 - 45.0 mmHg   pO2, Arterial 319.0 (H) 80.0 - 100.0 mmHg   Bicarbonate 23.2 20.0 - 24.0 mEq/L   TCO2 24 0 - 100 mmol/L   O2 Saturation 100.0 %   Acid-base deficit 1.0 0.0 - 2.0 mmol/L   Sodium 138 135 - 145 mmol/L   Potassium 3.0 (L) 3.5 - 5.1 mmol/L   Calcium, Ion 1.07 (L) 1.13 - 1.30 mmol/L   HCT 34.0 (L) 36.0 - 46.0 %   Hemoglobin 11.6 (L) 12.0 - 15.0 g/dL   Sample type ARTERIAL   I-STAT 7, (LYTES,  BLD GAS, ICA, H+H)     Status: Abnormal   Collection Time: 11/19/14 11:57 AM  Result Value Ref Range   pH, Arterial 7.380 7.350 - 7.450   pCO2 arterial 41.5 35.0 - 45.0 mmHg   pO2, Arterial 148.0 (H) 80.0 - 100.0 mmHg   Bicarbonate 24.6 (H) 20.0 - 24.0 mEq/L   TCO2 26 0 - 100 mmol/L   O2 Saturation 99.0 %   Acid-base deficit 1.0 0.0 - 2.0 mmol/L   Sodium 136 135 - 145 mmol/L   Potassium 3.1 (L) 3.5 - 5.1 mmol/L   Calcium, Ion 1.12 (L) 1.13 - 1.30 mmol/L   HCT 38.0 36.0 - 46.0 %   Hemoglobin 12.9 12.0 -  15.0 g/dL   Sample type ARTERIAL   I-STAT 7, (LYTES, BLD GAS, ICA, H+H)     Status: Abnormal   Collection Time: 11/19/14  2:44 PM  Result Value Ref Range   pH, Arterial 7.369 7.350 - 7.450   pCO2 arterial 38.8 35.0 - 45.0 mmHg   pO2, Arterial 104.0 (H) 80.0 - 100.0 mmHg   Bicarbonate 22.4 20.0 - 24.0 mEq/L   TCO2 24 0 - 100 mmol/L   O2 Saturation 98.0 %   Acid-base deficit 3.0 (H) 0.0 - 2.0 mmol/L   Sodium 136 135 - 145 mmol/L   Potassium 3.6 3.5 - 5.1 mmol/L   Calcium, Ion 1.09 (L) 1.13 - 1.30 mmol/L   HCT 32.0 (L) 36.0 - 46.0 %   Hemoglobin 10.9 (L) 12.0 - 15.0 g/dL   Sample type ARTERIAL   I-STAT 7, (LYTES, BLD GAS, ICA, H+H)     Status: Abnormal   Collection Time: 11/19/14  3:17 PM  Result Value Ref Range   pH, Arterial 7.398 7.350 - 7.450   pCO2 arterial 33.7 (L) 35.0 - 45.0 mmHg   pO2, Arterial 85.0 80.0 - 100.0 mmHg   Bicarbonate 20.8 20.0 - 24.0 mEq/L   TCO2 22 0 - 100 mmol/L   O2 Saturation 97.0 %   Acid-base deficit 3.0 (H) 0.0 - 2.0 mmol/L   Sodium 136 135 - 145 mmol/L   Potassium 3.7 3.5 - 5.1 mmol/L   Calcium, Ion 1.04 (L) 1.13 - 1.30 mmol/L   HCT 32.0 (L) 36.0 - 46.0 %   Hemoglobin 10.9 (L) 12.0 - 15.0 g/dL   Sample type ARTERIAL   I-STAT 7, (LYTES, BLD GAS, ICA, H+H)     Status: Abnormal   Collection Time: 11/19/14  4:06 PM  Result Value Ref Range   pH, Arterial 7.351 7.350 - 7.450   pCO2 arterial 40.7 35.0 - 45.0 mmHg   pO2, Arterial 391.0 (H) 80.0 -  100.0 mmHg   Bicarbonate 22.5 20.0 - 24.0 mEq/L   TCO2 24 0 - 100 mmol/L   O2 Saturation 100.0 %   Acid-base deficit 3.0 (H) 0.0 - 2.0 mmol/L   Sodium 136 135 - 145 mmol/L   Potassium 4.0 3.5 - 5.1 mmol/L   Calcium, Ion 1.04 (L) 1.13 - 1.30 mmol/L   HCT 35.0 (L) 36.0 - 46.0 %   Hemoglobin 11.9 (L) 12.0 - 15.0 g/dL   Sample type ARTERIAL   I-STAT 7, (LYTES, BLD GAS, ICA, H+H)     Status: Abnormal   Collection Time: 11/19/14  5:22 PM  Result Value Ref Range   pH, Arterial 7.358 7.350 - 7.450   pCO2 arterial 39.7 35.0 - 45.0 mmHg   pO2, Arterial 375.0 (H) 80.0 - 100.0 mmHg   Bicarbonate 22.3 20.0 - 24.0 mEq/L   TCO2 23 0 - 100 mmol/L   O2 Saturation 100.0 %   Acid-base deficit 3.0 (H) 0.0 - 2.0 mmol/L   Sodium 136 135 - 145 mmol/L   Potassium 4.2 3.5 - 5.1 mmol/L   Calcium, Ion 1.03 (L) 1.13 - 1.30 mmol/L   HCT 35.0 (L) 36.0 - 46.0 %   Hemoglobin 11.9 (L) 12.0 - 15.0 g/dL   Sample type ARTERIAL   Blood gas, arterial     Status: Abnormal   Collection Time: 11/19/14  7:34 PM  Result Value Ref Range   FIO2 1.00 %   Delivery systems VENTILATOR    Mode PRESSURE REGULATED VOLUME CONTROL    VT 600 mL  Rate 12 resp/min   Peep/cpap 5.0 cm H20   pH, Arterial 7.331 (L) 7.350 - 7.450   pCO2 arterial 40.8 35.0 - 45.0 mmHg   pO2, Arterial 464.0 (H) 80.0 - 100.0 mmHg   Bicarbonate 21.0 20.0 - 24.0 mEq/L   TCO2 19.3 0 - 100 mmol/L   Acid-base deficit 4.1 (H) 0.0 - 2.0 mmol/L   O2 Saturation 99.8 %   Patient temperature 98.3    Collection site ARTERIAL LINE    Drawn by [phone]    Sample type ARTERIAL    Allens test (pass/fail) PASS PASS  Triglycerides     Status: None   Collection Time: 11/19/14  7:46 PM  Result Value Ref Range   Triglycerides 60 <150 mg/dL    Comment: Performed at [facility]  MRSA PCR Screening     Status: None   Collection Time: 11/19/14  9:08 PM  Result Value Ref Range   MRSA by PCR NEGATIVE NEGATIVE    Comment:        The GeneXpert MRSA Assay  (FDA approved for NASAL specimens only), is one component of a comprehensive MRSA colonization surveillance program. It is not intended to diagnose MRSA infection nor to guide or monitor treatment for MRSA infections.   CBC     Status: Abnormal   Collection Time: 11/19/14  9:15 PM  Result Value Ref Range   WBC 40.0 (H) 4.0 - 10.5 K/uL    Comment: REPEATED TO VERIFY   RBC 4.21 3.87 - 5.11 MIL/uL   Hemoglobin 11.6 (L) 12.0 - 15.0 g/dL   HCT 36.0 36.0 - 46.0 %   MCV 85.5 78.0 - 100.0 fL   MCH 27.6 26.0 - 34.0 pg   MCHC 32.2 30.0 - 36.0 g/dL   RDW 16.0 (H) 11.5 - 15.5 %   Platelets 246 150 - 400 K/uL  Basic metabolic panel     Status: Abnormal   Collection Time: 11/19/14  9:15 PM  Result Value Ref Range   Sodium 138 135 - 145 mmol/L   Potassium 4.1 3.5 - 5.1 mmol/L   Chloride 107 96 - 112 mmol/L   CO2 22 19 - 32 mmol/L   Glucose, Bld 173 (H) 70 - 99 mg/dL   BUN 17 6 - 23 mg/dL   Creatinine, Ser 0.71 0.50 - 1.10 mg/dL   Calcium 7.9 (L) 8.4 - 10.5 mg/dL   GFR calc non Af Amer >90 >90 mL/min   GFR calc Af Amer >90 >90 mL/min    Comment: (NOTE) The eGFR has been calculated using the CKD EPI equation. This calculation has not been validated in all clinical situations. eGFR's persistently <90 mL/min signify possible Chronic Kidney Disease.    Anion gap 9 5 - 15  Blood gas, arterial     Status: Abnormal   Collection Time: 11/19/14 10:22 PM  Result Value Ref Range   FIO2 0.40 %   Delivery systems VENTILATOR    Mode PRESSURE REGULATED VOLUME CONTROL    VT 450 mL   Rate 12 resp/min   Peep/cpap 5.0 cm H20   pH, Arterial 7.308 (L) 7.350 - 7.450   pCO2 arterial 44.3 35.0 - 45.0 mmHg   pO2, Arterial 162.0 (H) 80.0 - 100.0 mmHg   Bicarbonate 21.6 20.0 - 24.0 mEq/L   TCO2 20.0 0 - 100 mmol/L   Acid-base deficit 4.2 (H) 0.0 - 2.0 mmol/L   O2 Saturation 98.9 %   Patient temperature 98.0    Collection site  ARTERIAL LINE    Drawn by [phone]    Sample type ARTERIAL    Allens test  (pass/fail) PASS PASS  Glucose, capillary     Status: Abnormal   Collection Time: 11/20/14  1:12 AM  Result Value Ref Range   Glucose-Capillary 199 (H) 70 - 99 mg/dL   Comment 1 Notify RN    Comment 2 Document in Chart   Glucose, capillary     Status: Abnormal   Collection Time: 11/20/14  3:58 AM  Result Value Ref Range   Glucose-Capillary 194 (H) 70 - 99 mg/dL  Blood gas, arterial     Status: Abnormal   Collection Time: 11/20/14  4:16 AM  Result Value Ref Range   FIO2 0.30 %   Delivery systems VENTILATOR    Mode PRESSURE REGULATED VOLUME CONTROL    VT 450 mL   Rate 12 resp/min   Peep/cpap 5.0 cm H20   pH, Arterial 7.327 (L) 7.350 - 7.450   pCO2 arterial 45.2 (H) 35.0 - 45.0 mmHg   pO2, Arterial 104.0 (H) 80.0 - 100.0 mmHg   Bicarbonate 23.2 20.0 - 24.0 mEq/L   TCO2 21.6 0 - 100 mmol/L   Acid-base deficit 2.4 (H) 0.0 - 2.0 mmol/L   O2 Saturation 97.6 %   Patient temperature 97.8    Collection site ARTERIAL LINE    Drawn by [phone]    Sample type ARTERIAL    Allens test (pass/fail) PASS PASS  Basic metabolic panel     Status: Abnormal   Collection Time: 11/20/14  5:15 AM  Result Value Ref Range   Sodium 136 135 - 145 mmol/L   Potassium 4.1 3.5 - 5.1 mmol/L   Chloride 105 96 - 112 mmol/L   CO2 23 19 - 32 mmol/L   Glucose, Bld 223 (H) 70 - 99 mg/dL   BUN 18 6 - 23 mg/dL   Creatinine, Ser 0.81 0.50 - 1.10 mg/dL   Calcium 7.8 (L) 8.4 - 10.5 mg/dL   GFR calc non Af Amer 77 (L) >90 mL/min   GFR calc Af Amer 90 (L) >90 mL/min    Comment: (NOTE) The eGFR has been calculated using the CKD EPI equation. This calculation has not been validated in all clinical situations. eGFR's persistently <90 mL/min signify possible Chronic Kidney Disease.    Anion gap 8 5 - 15  CBC     Status: Abnormal   Collection Time: 11/20/14  5:15 AM  Result Value Ref Range   WBC 20.2 (H) 4.0 - 10.5 K/uL   RBC 4.01 3.87 - 5.11 MIL/uL   Hemoglobin 10.9 (L) 12.0 - 15.0 g/dL   HCT 34.3 (L) 36.0 - 46.0  %   MCV 85.5 78.0 - 100.0 fL   MCH 27.2 26.0 - 34.0 pg   MCHC 31.8 30.0 - 36.0 g/dL   RDW 16.7 (H) 11.5 - 15.5 %   Platelets 274 150 - 400 K/uL  Magnesium     Status: Abnormal   Collection Time: 11/20/14  5:15 AM  Result Value Ref Range   Magnesium 1.1 (L) 1.5 - 2.5 mg/dL    Imaging / Studies: Dg Chest Port 1 View  11/20/2014   CLINICAL DATA:  Endotracheal tube position  EXAM: PORTABLE CHEST - 1 VIEW  COMPARISON:  08/25/2014  FINDINGS: There is an endotracheal tube with tip between the clavicular heads and carina (tip 18 mm above the carina). There is hypoventilation with interstitial crowding. No edema, effusion, or air leak. Normal  heart size and aortic contours.  IMPRESSION: 1. The endotracheal tube is in good position. 2. Hypoventilation.   Electronically Signed   By: Monte Fantasia M.D.   On: 11/20/2014 05:37    Medications / Allergies: per chart  Antibiotics: Anti-infectives    Start     Dose/Rate Route Frequency Ordered Stop   11/19/14 2200  clindamycin (CLEOCIN) IVPB 900 mg     900 mg 100 mL/hr over 30 Minutes Intravenous 3 times per day 11/19/14 1902 11/19/14 2217   11/18/14 1600  clindamycin (CLEOCIN) 900 mg, gentamicin (GARAMYCIN) 240 mg in sodium chloride 0.9 % 1,000 mL for intraperitoneal lavage    Comments:  Pharmacy may adjust dosing strength, schedule, rate of infusion, etc as needed to optimize therapy    Intraperitoneal To Surgery 11/18/14 1559 11/19/14 1015   11/18/14 1559  clindamycin (CLEOCIN) IVPB 900 mg     900 mg 100 mL/hr over 30 Minutes Intravenous 60 min pre-op 11/18/14 1559     11/18/14 1559  gentamicin (GARAMYCIN) 560 mg in dextrose 5 % 100 mL IVPB     5 mg/kg  111.6 kg 114 mL/hr over 60 Minutes Intravenous 60 min pre-op 11/18/14 1559 11/19/14 1030       Note: Portions of this report may have been transcribed using voice recognition software. Every effort was made to ensure accuracy; however, inadvertent computerized transcription errors may be  present.   Any transcriptional errors that result from this process are unintentional.     [name], M.D., F.A.C.S. Gastrointestinal and Minimally Invasive Surgery [facility] [address], [place], [place] [address] [phone] Main / Paging   11/20/2014

## 2014-11-20 NOTE — Consult Note (Signed)
[name]   DOB:1955-09-03   FM#:[id number]   TTS#:[id number]  Patient Care Team: Leana Gamer, MD as PCP - General (Internal Medicine) [name], MD as Referring Physician (Obstetrics and Gynecology) Heath Lark, MD as Consulting Physician (Hematology and Oncology) Everitt Amber, MD as Consulting Physician (Obstetrics and Gynecology) Michael [name], MD as Consulting Physician (General Surgery)  I have seen the patient, examined her and edited the notes as follows  Subjective: [name] is a 59 year old woman with a history of endometrial and ovarian cancer admitted on 11/19/2014 for resection as described below.The patient is somnolent, as she has been just extubated.She is being seen at the ICU. Denies fevers, chills, night sweats, vision changes, or mucositis. Denies any respiratory complaints. Denies any chest pain or palpitations. Denies lower extremity swelling. Denies nausea, heartburn. Denies abnormal skin rashes, or neuropathy. Denies any bleeding issues such as epistaxis, hematemesis, hematuria or hematochezia.   SUMMARY OF ONCOLOGIC HISTORY:  1. 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  According to the patient, initial diagnosis with ovarian cancer was when she presented with abdominal swelling and pelvic pain leading to her urgent care visit. She states that the tumor had caused torsion of the ovary. She cannot remember the stage of her disease. On 09/12/2014, CT scan of the abdomen and pelvis show possible relapse of gynecological Malignancy 09/20/14 vaginal biopsy with squamous mucosa without dysplasia or malignancy identified PET 10/10/14 showed hypermetabolic pelvic soft tissue mass involving the vaginal cuff and pelvic  cul-de-sac consistent with recurrent carcinoma. No evidence of pelvic lymph node or distant metastatic disease. On 10/14/2014, MRI of the pelvis show significant pelvic mass without invasion into the bladder but abutting to the rectum She was admitted to Hospital for resection of pelvic mass, radical upper vaginectomy, low anterior rectosigmoid resection, end descending colostomy, bilateral ureteral stent and cystotomy closure by Dr. Denman [name] on 11/19/14. Path pending  2. History of Leukopenia  She began to develop leukopenia in approximately July of 2010. She underwent an initial bone marrow biopsy at [facility] in [place] on 03/17/2009 but it was nondiagnostic. No gross dysplastic changes, no excess blasts, normal cytogenetics.  Her counts have been stable over observation through this office since December 2010 with some minor fluctuations. Total white counts run as low as 1800 and as high as 2900. Percent neutrophils fluctuates very widely from as low as 4% recorded on a CBC done 02/03/2010 to as high as 31% with average being 20%. She was hospitalized in the ICU at that time Both hemoglobin and platelets have remained stable over time.  Despite leukopenia, she has not had any recurrent or severe infections. She stated strong family history of lupus in a cousin. She complained of occasional skin rash and joint stiffness. 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 She received G-CSF in January of 2015 to stimulate white blood cell production around a colonoscopy procedure a few years ago. She had a limited response. On 01/23/2014, repeat bone marrow biopsy excluded lymphoma or leukemia. She received G-CSF prior to surgery on 3/25, 4/1, 4/11, 4/15 and 4/18   Scheduled Meds: . acetaminophen  1,000 mg Oral TID  . alvimopan  12 mg Oral BID  . antiseptic oral rinse  7 mL Mouth Rinse QID  .  chlorhexidine  15 mL Mouth Rinse BID  . heparin subcutaneous  5,000  Units Subcutaneous 3 times per day  . insulin aspart  0-20 Units Subcutaneous 6 times per day  . levothyroxine  175 mcg Oral QAC breakfast  . lip balm  1 application Topical BID  . magnesium sulfate 1 - 4 g bolus IVPB  4 g Intravenous Once  . multivitamin with minerals  1 tablet Oral Daily  . pantoprazole (PROTONIX) IV  40 mg Intravenous Q12H  . rosuvastatin  10 mg Oral QPM  . saccharomyces boulardii  250 mg Oral BID  . [START ON 11/22/2014] Tbo-Filgrastim  480 mcg Subcutaneous Weekly   Continuous Infusions: . sodium chloride 100 mL/hr (11/19/14 2043)  . phenylephrine (NEO-SYNEPHRINE) Adult infusion 120 mcg/min (11/20/14 0700)  . propofol (DIPRIVAN) infusion Stopped (11/20/14 0700)   PRN Meds:albuterol, alum & mag hydroxide-simeth, diphenhydrAMINE **OR** diphenhydrAMINE, lactated ringers, magic mouthwash, menthol-cetylpyridinium, methocarbamol (ROBAXIN)  IV, ondansetron **OR** ondansetron (ZOFRAN) IV, phenol   Objective:  Filed Vitals:   11/20/14 0715  BP: 108/45  Pulse: 93  Temp:   Resp: 13      Intake/Output Summary (Last 24 hours) at 11/20/14 0840 Last data filed at 11/20/14 0700  Gross per 24 hour  Intake 8550.4 ml  Output   2995 ml  Net 5555.4 ml    ECOG PERFORMANCE STATUS: 4  GENERAL: Somnolent,  Recently extubated, appears comfortable SKIN: skin color, texture, turgor are normal, no rashes or significant lesions EYES: normal, conjunctiva are pink and non-injected, sclera clear OROPHARYNX:no exudate, no erythema and lips, buccal mucosa, and tongue normal  NECK: supple, thyroid normal size, non-tender, without nodularity LYMPH:  no palpable lymphadenopathy in the cervical, axillary or inguinal LUNGS: clear to auscultation and percussion with normal breathing effort HEART: regular rate & rhythm and no murmurs and no lower extremity edema ABDOMEN: soft, Incisional tenderness, normal bowel sounds. There is a JP drain at the right lower quadrant, and a colostomy in the  left lower quadrant Musculoskeletal:no cyanosis of digits and no clubbing  PSYCH: alert & oriented x 3 with fluent speech NEURO: no focal motor/sensory deficits    CBG (last 3)   Recent Labs  11/20/14 0112 11/20/14 0358 11/20/14 0745  GLUCAP 199* 194* 229*     Labs:   Recent Labs Lab 11/15/14 1059 11/18/14 0821  11/19/14 1517 11/19/14 1606 11/19/14 1722 11/19/14 2115 11/20/14 0515  WBC 4.8 11.0*  --   --   --   --  40.0* 20.2*  HGB 13.7 13.1  < > 10.9* 11.9* 11.9* 11.6* 10.9*  HCT 43.8 41.5  < > 32.0* 35.0* 35.0* 36.0 34.3*  PLT 355 286  --   --   --   --  246 274  MCV 85.6 88.3  --   --   --   --  85.5 85.5  MCH 26.9 27.9  --   --   --   --  27.6 27.2  MCHC 31.4* 31.6  --   --   --   --  32.2 31.8  RDW 17.4* 16.3*  --   --   --   --  16.0* 16.7*  LYMPHSABS 1.8 2.5  --   --   --   --   --   --   MONOABS 0.8 1.0*  --   --   --   --   --   --   EOSABS 0.1 0.2  --   --   --   --   --   --  BASOSABS 0.1 0.1  --   --   --   --   --   --   < > = values in this interval not displayed.   Chemistries:    Recent Labs Lab 11/19/14 1517 11/19/14 1606 11/19/14 1722 11/19/14 2115 11/20/14 0515  NA 136 136 136 138 136  K 3.7 4.0 4.2 4.1 4.1  CL  --   --   --  107 105  CO2  --   --   --  22 23  GLUCOSE  --   --   --  173* 223*  BUN  --   --   --  17 18  CREATININE  --   --   --  0.71 0.81  CALCIUM  --   --   --  7.9* 7.8*  MG  --   --   --   --  1.1*    GFR Estimated Creatinine Clearance: 90.1 mL/min (by C-G formula based on Cr of 0.81).  Liver Function Tests: No results for input(s): AST, ALT, ALKPHOS, BILITOT, PROT, ALBUMIN in the last 168 hours. No results for input(s): LIPASE, AMYLASE in the last 168 hours. No results for input(s): AMMONIA in the last 168 hours.  Urine Studies     Component Value Date/Time   COLORURINE YELLOW 11/07/2014 1017   APPEARANCEUR CLEAR 11/07/2014 1017   LABSPEC 1.015 11/11/2014 1220   LABSPEC 1.026 11/07/2014 1017    PHURINE 5.0 11/07/2014 1017   GLUCOSEU 2000 11/11/2014 1220   GLUCOSEU >1000* 11/07/2014 1017   HGBUR NEGATIVE 11/07/2014 1017   BILIRUBINUR NEGATIVE 11/07/2014 1017   KETONESUR NEGATIVE 11/07/2014 1017   PROTEINUR NEGATIVE 11/07/2014 1017   UROBILINOGEN 0.2 11/11/2014 1220   UROBILINOGEN 0.2 11/07/2014 1017   NITRITE Negative 11/11/2014 1220   NITRITE NEGATIVE 11/07/2014 1017   LEUKOCYTESUR NEGATIVE 11/07/2014 1017    Coagulation profile No results for input(s): INR, PROTIME in the last 168 hours.  Cardiac Enzymes: No results for input(s): CKTOTAL, CKMB, CKMBINDEX, TROPONINI in the last 168 hours. BNP: Invalid input(s): POCBNP CBG:  Recent Labs Lab 11/19/14 1158 11/19/14 2035 11/20/14 0112 11/20/14 0358 11/20/14 0745  GLUCAP 116* 157* 199* 194* 229*   Microbiology Negative for MRSA   Imaging Studies:  Dg Chest Port 1 View  11/20/2014   CLINICAL DATA:  Endotracheal tube position  EXAM: PORTABLE CHEST - 1 VIEW  COMPARISON:  08/25/2014  FINDINGS: There is an endotracheal tube with tip between the clavicular heads and carina (tip 18 mm above the carina). There is hypoventilation with interstitial crowding. No edema, effusion, or air leak. Normal heart size and aortic contours.  IMPRESSION: 1. The endotracheal tube is in good position. 2. Hypoventilation.   Electronically Signed   By: Monte Fantasia M.D.   On: 11/20/2014 05:37    Assessment/Plan: 59 y.o.   Endometrial and ovarian cancer She was admitted on 4/19 for surgery.  She underwent She was admitted to Hospital for resection of pelvic mass, radical upper vaginectomy, low anterior rectosigmoid resection, end descending colostomy, bilateral ureteral stent and cystotomy closure by Dr. Denman [name] on 11/19/14. Path pending  Chronic neutropenia, ow with leukocytosis She received G-CSF prior to surgery on 3/25, 4/1, 4/11, 4/15 and 4/18 No intervention indicated at this time, As leukocytosis is likely reactive, as well as due to  recent injections with G CSF Recommend that she receives G-CSF injection to keep her [place] greater than 1500 when needed during hospitalization We'll recheck tomorrow.  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 Patient received 2 units intraoperatively, her hemoglobin is stable at 10.9. No transfusion is indicated at this time Monitor counts closely Transfuse blood to maintain a Hb of 8 g or if the patient is acutely bleeding  DVT prophylaxis On subcutaneous heparin  Full Code  Other medical issues Including diabetes mellitus as per admitting team  **Disclaimer: This note was dictated with voice recognition software. Similar sounding words can inadvertently be transcribed and this note may contain transcription errors which may not have been corrected upon publication of note.Sharene Butters E, PA-C 11/20/2014  8:40 AM  Daionna Crossland, MD 11/20/2014

## 2014-11-20 NOTE — Progress Notes (Signed)
Patient seen and assessed.  Reporting moderate pain with no relief from Fentanyl PCA.  RN in process of switching PCA to full dose dilaudid.  Patient reporting intermittent nausea this am with no emesis.  Lungs clear, HR normal sinus rhythm, abdomen soft non-distended, faint bowel sounds,  JP emptied with 100 cc serosanguinous drainage, foley to straight drain with blood tinged urine, no BLE edema.  All questions answered.  Continue post-operative plan of care per Dr. Denman [name] and Dr. Johney Maine.  Plan for transfer to surgical floor in the am per Dr. Johney Maine.

## 2014-11-20 NOTE — Progress Notes (Signed)
Patient placed on 100% O2, suctioned, mouth care given, and orally suctioned. Cuff delated air felt around tube and extubated to a [place] 2 2lpm humidified. HR 105, RR20, BP 126/51, O2 sat 99%. Verbalizing well. Will continue to monitor

## 2014-11-20 NOTE — Care Management Note (Signed)
CARE MANAGEMENT NOTE 11/20/2014  Patient:  [facility]   Account Number:  [id number]  Date Initiated:  11/20/2014  Documentation initiated by:  DAVIS,RHONDA  Subjective/Objective Assessment:   LAPAROSCOPIC/ROBOTIC LYSIS OF ADHESIONS*   EXPLORATORY LAPAROTOMY   remained intubated post op      Action/Plan:   home when stable will follow for any dc needs   Anticipated DC Date:  11/23/2014   Anticipated DC Plan:  HOME/SELF CARE  In-house referral  NA      DC Planning Services  CM consult      PAC Choice  NA   Choice offered to / List presented to:  NA   DME arranged  NA      DME agency  NA     [place] arranged  NA      [place] agency  NA   Status of service:  In process, will continue to follow Medicare Important Message given?   (If response is "NO", the following Medicare IM given date fields will be blank) Date Medicare IM given:   Medicare IM given by:   Date Additional Medicare IM given:   Additional Medicare IM given by:    Discharge Disposition:    Per UR Regulation:  Reviewed for med. necessity/level of care/duration of stay  If discussed at [place] of Stay Meetings, dates discussed:    Comments:  November 20, 2014/Rhonda L. Rosana Hoes, RN, BSN, CCM. Case Management [place] [phone] No discharge needs present of time of review.

## 2014-11-20 NOTE — Consult Note (Addendum)
WOC ostomy follow up C[facility] following for assessment and plan of care to abd wound. First post-op day; pt is very sore.  Stoma type/location:  Colostomy to LLQ from surgery on 4/19 Stomal assessment/size: Stoma red and viable, above skin level when visualized through pouch which is intact with good seal. Output: No stool or flatus at this time.  Scant amt pink liquid in the pouch. Ostomy pouching: 2pc.  Education provided: Assessed pouch and WOC team will begin teaching sessions when stable and out of ICU. Supplies at bedside for staff nurse use. Julien Girt MSN, RN, [place], [place], [place]

## 2014-11-21 DIAGNOSIS — D6959 Other secondary thrombocytopenia: Secondary | ICD-10-CM

## 2014-11-21 LAB — GLUCOSE, CAPILLARY
GLUCOSE-CAPILLARY: 178 mg/dL — AB (ref 70–99)
GLUCOSE-CAPILLARY: 145 mg/dL — AB (ref 70–99)
GLUCOSE-CAPILLARY: 147 mg/dL — AB (ref 70–99)
Glucose-Capillary: 168 mg/dL — ABNORMAL HIGH (ref 70–99)
Glucose-Capillary: 115 mg/dL — ABNORMAL HIGH (ref 70–99)
Glucose-Capillary: 122 mg/dL — ABNORMAL HIGH (ref 70–99)
Glucose-Capillary: 194 mg/dL — ABNORMAL HIGH (ref 70–99)

## 2014-11-21 LAB — BASIC METABOLIC PANEL
Anion gap: 3 — ABNORMAL LOW (ref 5–15)
BUN: 18 mg/dL (ref 6–23)
CO2: 29 mmol/L (ref 19–32)
Calcium: 7.9 mg/dL — ABNORMAL LOW (ref 8.4–10.5)
Chloride: 106 mmol/L (ref 96–112)
Creatinine, Ser: 0.64 mg/dL (ref 0.50–1.10)
GFR calc Af Amer: >90 mL/min (ref >90)
GFR calc non Af Amer: >90 mL/min (ref >90)
GLUCOSE: 108 mg/dL — AB (ref 70–99)
POTASSIUM: 4 mmol/L (ref 3.5–5.1)
Sodium: 138 mmol/L (ref 135–145)

## 2014-11-21 LAB — CBC WITH DIFFERENTIAL/PLATELET
Basophils Absolute: 0 10*3/uL (ref 0.0–0.1)
Basophils Relative: 0 % (ref 0–1)
Eosinophils Absolute: 0 10*3/uL (ref 0.0–0.7)
Eosinophils Relative: 0 % (ref 0–5)
HCT: 24.9 % — ABNORMAL LOW (ref 36.0–46.0)
Hemoglobin: 8.1 g/dL — ABNORMAL LOW (ref 12.0–15.0)
Lymphocytes Relative: 20 % (ref 12–46)
Lymphs Abs: 1.8 10*3/uL (ref 0.7–4.0)
MCH: 28 pg (ref 26.0–34.0)
MCHC: 32.5 g/dL (ref 30.0–36.0)
MCV: 86.2 fL (ref 78.0–100.0)
MONO ABS: 0.7 10*3/uL (ref 0.1–1.0)
MONOS PCT: 8 % (ref 3–12)
Neutro Abs: 6.7 10*3/uL (ref 1.7–7.7)
Neutrophils Relative %: 72 % (ref 43–77)
Platelets: 143 10*3/uL — ABNORMAL LOW (ref 150–400)
RBC: 2.89 MIL/uL — ABNORMAL LOW (ref 3.87–5.11)
RDW: 16.4 % — ABNORMAL HIGH (ref 11.5–15.5)
WBC: 9.2 10*3/uL (ref 4.0–10.5)

## 2014-11-21 LAB — MAGNESIUM: Magnesium: 1.9 mg/dL (ref 1.5–2.5)

## 2014-11-21 MED ORDER — TBO-FILGRASTIM 480 MCG/0.8ML ~~LOC~~ SOSY
480.0000 ug | PREFILLED_SYRINGE | Freq: Every day | SUBCUTANEOUS | Status: DC | PRN
  Filled 2014-11-21: qty 0.8

## 2014-11-21 MED ORDER — LACTATED RINGERS IV BOLUS (SEPSIS): 1000.0000 mL | Freq: Three times a day (TID) | INTRAVENOUS | Status: AC | PRN

## 2014-11-21 MED ORDER — METFORMIN HCL 500 MG PO TABS
1000.0000 mg | ORAL_TABLET | Freq: Two times a day (BID) | ORAL | Status: DC
  Administered 2014-11-21 – 2014-11-25 (×9): 1000 mg via ORAL
  Filled 2014-11-21 (×13): qty 2

## 2014-11-21 MED ORDER — FUROSEMIDE 10 MG/ML IJ SOLN
40.0000 mg | Freq: Once | INTRAMUSCULAR | Status: AC
Start: 2014-11-21 — End: 2014-11-21
  Administered 2014-11-21: 40 mg via INTRAVENOUS
  Filled 2014-11-21: qty 4

## 2014-11-21 MED ORDER — PANTOPRAZOLE SODIUM 40 MG PO TBEC
40.0000 mg | DELAYED_RELEASE_TABLET | Freq: Every day | ORAL | Status: DC
Start: 2014-11-21 — End: 2014-11-25
  Administered 2014-11-21 – 2014-11-25 (×5): 40 mg via ORAL
  Filled 2014-11-21 (×5): qty 1

## 2014-11-21 NOTE — Progress Notes (Addendum)
[facility]  [address] [place], [place] [address] Phone: [phone] FAX: [phone]    [name] [id number] 29-Jan-1956  CARE TEAM:  PCP: Leana Gamer., MD  Outpatient Care Team: Patient Care Team: Leana Gamer, MD as PCP - General (Internal Medicine) [name], MD as Referring Physician (Obstetrics and Gynecology) Heath Lark, MD as Consulting Physician (Hematology and Oncology) Everitt Amber, MD as Consulting Physician (Obstetrics and Gynecology) Michael [name], MD as Consulting Physician (General Surgery)  Inpatient Treatment Team: Treatment Team: Attending Provider: Everitt Amber, MD; Physician Assistant: Rondel Jumbo, PA-C; Consulting Physician: Heath Lark, MD; Consulting Physician: Md Pccm, MD; Nurse Practitioner: Dorothyann Gibbs, NP; Registered Nurse: Arna Medici, RN; Consulting Physician: Michael [name], MD  Problem List:   Active Problems:   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 s/p resection/partial vaginectomy/ LAR/colostomy 11/19/2014   History of ovarian cancer   Acute respiratory failure with hypoxia   Endotracheally intubated   Morbid obesity   2 Days Post-Op  Procedure(s): POST-OPERATIVE DIAGNOSIS: 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  PROCEDURE:  LAPAROSCOPIC/ROBOTIC LYSIS OF ADHESIONS*^ EXPLORATORY LAPAROTOMY^ RESECTION OF PELVIC MASS^ RADICAL UPPER VAGINECTOMY^ LOW ANTERIOR RECTOSIGMOID RESECTION* END DESCENDING COLOSTOMY* BILATERAL URETERAL STENT PLACEMENT^ CYSTOTOMY CLOSURE^  SURGEON: : Everitt Amber, MD^ Michael [name], MD*  ASSISTANT: Lahoma Crocker, MD  Assessment  Stabilizing  Plan:  Transfer to floor today  Adv diet w flatus.  Pt not wants solids yet - prob tomorrow  Anemia.  Low Hgb - follow - transfuse if Hgb <7 or symptomatic if GynOnc Dr Rossi/LJM agree  Pain control - better w Dilaudid PCA.  Continue tylenol RTC.  Prob wean PCA & change to PO  oxycodone tomorrow if adv diet OK  Colostomy care.  WOCN involved  Foley - DO NOT REMOVE due to bladder repair & ureteral stenting.  She had complex cystotomy repair (trigone and base) and ureteral stenting. DO NOT REMOVE FOLEY CATHETER - will perform retrograde cystogram at 2-3 weeks and will d.c foley at that time IF no leak noted. Will check JP creatinine prior to removing drain (if elevated above serum levels would keep JP in until foley comes out/bladder integrity confirmed on imaging). Remove ureteral stents at 6 weeks if no stricture/leak on CT urogram.  F/u pathology  Improve glc control - resistent SSI.  Restart metformin  HTN - OK off meds - restart gradually.  GERD - protonix daily -VTE prophylaxis- SCDs, etc  -mobilize as tolerated to help recovery  I updated the patient's status to the patient & ICU RN Hoyle Sauer.  Recommendations were made.  Questions were answered.  The patient expressed understanding & appreciation.   [name], M.D., F.A.C.S. Gastrointestinal and Minimally Invasive Surgery [facility] [address], [place], [place] [address] [phone] Main / Paging   11/21/2014  Subjective:  Extubated. Some pain but controlled better w Dilaudid PCA.  Fent never worked well for her Tol clears  Objective:  Vital signs:  Filed Vitals:   11/21/14 0000 11/21/14 0245 11/21/14 0307 11/21/14 0400  BP:  96/75  120/58  Pulse:  80  81  Temp: 98.1 F (36.7 C)  98.1 F (36.7 C)   TempSrc: Oral  Axillary   Resp:  18  13  Height:      Weight:      SpO2:  99%  98%    Last BM Date:  (colostomy)  Intake/Output   Yesterday:  04/20 6195 -  04/21 0700 In: 2410 [P.O.:540; I.V.:1590] Out: 1970 [Urine:1525; Emesis/NG output:150; Drains:245; Stool:50] This shift:  Total I/O In: 45 [P.O.:540; I.V.:450] Out: 675 [Urine:575; Drains:50; Stool:50]  Bowel function:  Flatus: scant  BM: n  Drain:more thinly   serosanguinous  Physical Exam:  General: Pt awake/alert/oriented in no acute distress.  Smiling, chatty Eyes: PERRL, normal EOM.  Sclera clear.  No icterus Neuro: CN II-XII intact w/o focal sensory/motor deficits. Lymph: No head/neck/groin lymphadenopathy Psych:  No delerium/psychosis/paranoia HENT: Normocephalic, Mucus membranes moist.  No thrush.  ETT in place Neck: Supple, No tracheal deviation Chest: No chest wall pain w good excursion CV:  Pulses intact.  Regular rhythm MS: Normal AROM mjr joints.  No obvious deformity Abdomen: Soft.  Nondistended.  Dressings w old blood.  Mildly tender at incisions only.  No evidence of peritonitis.  No incarcerated hernias. Ext:  SCDs BLE.  No mjr edema.  No cyanosis Skin: No petechiae / purpura  Results:   Labs: Results for orders placed or performed during the hospital encounter of 11/19/14 (from the past 48 hour(s))  Glucose, capillary     Status: Abnormal   Collection Time: 11/19/14  8:08 AM  Result Value Ref Range   Glucose-Capillary 129 (H) 70 - 99 mg/dL  Type and screen     Status: None (Preliminary result)   Collection Time: 11/19/14  8:32 AM  Result Value Ref Range   ABO/RH(D) A POS    Antibody Screen NEG    Sample Expiration 11/22/2014    Unit Number [id number]    Blood Component Type RED CELLS,LR    Unit division 00    Status of Unit ISSUED,FINAL    Transfusion Status OK TO TRANSFUSE    Crossmatch Result Compatible    Unit Number [id number]    Blood Component Type RED CELLS,LR    Unit division 00    Status of Unit ISSUED,FINAL    Transfusion Status OK TO TRANSFUSE    Crossmatch Result Compatible    Unit Number [id number]    Blood Component Type RED CELLS,LR    Unit division 00    Status of Unit ALLOCATED    Transfusion Status OK TO TRANSFUSE    Crossmatch Result Compatible    Unit Number [id number]    Blood Component Type RED CELLS,LR    Unit division 00    Status of Unit ALLOCATED    Transfusion  Status OK TO TRANSFUSE    Crossmatch Result Compatible   ABO/Rh     Status: None   Collection Time: 11/19/14  8:32 AM  Result Value Ref Range   ABO/RH(D) A POS   I-STAT 7, (LYTES, BLD GAS, ICA, H+H)     Status: Abnormal   Collection Time: 11/19/14 10:48 AM  Result Value Ref Range   pH, Arterial 7.391 7.350 - 7.450   pCO2 arterial 38.3 35.0 - 45.0 mmHg   pO2, Arterial 319.0 (H) 80.0 - 100.0 mmHg   Bicarbonate 23.2 20.0 - 24.0 mEq/L   TCO2 24 0 - 100 mmol/L   O2 Saturation 100.0 %   Acid-base deficit 1.0 0.0 - 2.0 mmol/L   Sodium 138 135 - 145 mmol/L   Potassium 3.0 (L) 3.5 - 5.1 mmol/L   Calcium, Ion 1.07 (L) 1.13 - 1.30 mmol/L   HCT 34.0 (L) 36.0 - 46.0 %   Hemoglobin 11.6 (L) 12.0 - 15.0 g/dL   Sample type ARTERIAL   I-STAT 7, (LYTES, BLD GAS, ICA, H+H)     Status: Abnormal  Collection Time: 11/19/14 11:57 AM  Result Value Ref Range   pH, Arterial 7.380 7.350 - 7.450   pCO2 arterial 41.5 35.0 - 45.0 mmHg   pO2, Arterial 148.0 (H) 80.0 - 100.0 mmHg   Bicarbonate 24.6 (H) 20.0 - 24.0 mEq/L   TCO2 26 0 - 100 mmol/L   O2 Saturation 99.0 %   Acid-base deficit 1.0 0.0 - 2.0 mmol/L   Sodium 136 135 - 145 mmol/L   Potassium 3.1 (L) 3.5 - 5.1 mmol/L   Calcium, Ion 1.12 (L) 1.13 - 1.30 mmol/L   HCT 38.0 36.0 - 46.0 %   Hemoglobin 12.9 12.0 - 15.0 g/dL   Sample type ARTERIAL   Glucose, capillary     Status: Abnormal   Collection Time: 11/19/14 11:58 AM  Result Value Ref Range   Glucose-Capillary 116 (H) 70 - 99 mg/dL  I-STAT 7, (LYTES, BLD GAS, ICA, H+H)     Status: Abnormal   Collection Time: 11/19/14  2:44 PM  Result Value Ref Range   pH, Arterial 7.369 7.350 - 7.450   pCO2 arterial 38.8 35.0 - 45.0 mmHg   pO2, Arterial 104.0 (H) 80.0 - 100.0 mmHg   Bicarbonate 22.4 20.0 - 24.0 mEq/L   TCO2 24 0 - 100 mmol/L   O2 Saturation 98.0 %   Acid-base deficit 3.0 (H) 0.0 - 2.0 mmol/L   Sodium 136 135 - 145 mmol/L   Potassium 3.6 3.5 - 5.1 mmol/L   Calcium, Ion 1.09 (L) 1.13 -  1.30 mmol/L   HCT 32.0 (L) 36.0 - 46.0 %   Hemoglobin 10.9 (L) 12.0 - 15.0 g/dL   Sample type ARTERIAL   I-STAT 7, (LYTES, BLD GAS, ICA, H+H)     Status: Abnormal   Collection Time: 11/19/14  3:17 PM  Result Value Ref Range   pH, Arterial 7.398 7.350 - 7.450   pCO2 arterial 33.7 (L) 35.0 - 45.0 mmHg   pO2, Arterial 85.0 80.0 - 100.0 mmHg   Bicarbonate 20.8 20.0 - 24.0 mEq/L   TCO2 22 0 - 100 mmol/L   O2 Saturation 97.0 %   Acid-base deficit 3.0 (H) 0.0 - 2.0 mmol/L   Sodium 136 135 - 145 mmol/L   Potassium 3.7 3.5 - 5.1 mmol/L   Calcium, Ion 1.04 (L) 1.13 - 1.30 mmol/L   HCT 32.0 (L) 36.0 - 46.0 %   Hemoglobin 10.9 (L) 12.0 - 15.0 g/dL   Sample type ARTERIAL   I-STAT 7, (LYTES, BLD GAS, ICA, H+H)     Status: Abnormal   Collection Time: 11/19/14  4:06 PM  Result Value Ref Range   pH, Arterial 7.351 7.350 - 7.450   pCO2 arterial 40.7 35.0 - 45.0 mmHg   pO2, Arterial 391.0 (H) 80.0 - 100.0 mmHg   Bicarbonate 22.5 20.0 - 24.0 mEq/L   TCO2 24 0 - 100 mmol/L   O2 Saturation 100.0 %   Acid-base deficit 3.0 (H) 0.0 - 2.0 mmol/L   Sodium 136 135 - 145 mmol/L   Potassium 4.0 3.5 - 5.1 mmol/L   Calcium, Ion 1.04 (L) 1.13 - 1.30 mmol/L   HCT 35.0 (L) 36.0 - 46.0 %   Hemoglobin 11.9 (L) 12.0 - 15.0 g/dL   Sample type ARTERIAL   I-STAT 7, (LYTES, BLD GAS, ICA, H+H)     Status: Abnormal   Collection Time: 11/19/14  5:22 PM  Result Value Ref Range   pH, Arterial 7.358 7.350 - 7.450   pCO2 arterial 39.7 35.0 - 45.0  mmHg   pO2, Arterial 375.0 (H) 80.0 - 100.0 mmHg   Bicarbonate 22.3 20.0 - 24.0 mEq/L   TCO2 23 0 - 100 mmol/L   O2 Saturation 100.0 %   Acid-base deficit 3.0 (H) 0.0 - 2.0 mmol/L   Sodium 136 135 - 145 mmol/L   Potassium 4.2 3.5 - 5.1 mmol/L   Calcium, Ion 1.03 (L) 1.13 - 1.30 mmol/L   HCT 35.0 (L) 36.0 - 46.0 %   Hemoglobin 11.9 (L) 12.0 - 15.0 g/dL   Sample type ARTERIAL   Blood gas, arterial     Status: Abnormal   Collection Time: 11/19/14  7:34 PM  Result Value  Ref Range   FIO2 1.00 %   Delivery systems VENTILATOR    Mode PRESSURE REGULATED VOLUME CONTROL    VT 600 mL   Rate 12 resp/min   Peep/cpap 5.0 cm H20   pH, Arterial 7.331 (L) 7.350 - 7.450   pCO2 arterial 40.8 35.0 - 45.0 mmHg   pO2, Arterial 464.0 (H) 80.0 - 100.0 mmHg   Bicarbonate 21.0 20.0 - 24.0 mEq/L   TCO2 19.3 0 - 100 mmol/L   Acid-base deficit 4.1 (H) 0.0 - 2.0 mmol/L   O2 Saturation 99.8 %   Patient temperature 98.3    Collection site ARTERIAL LINE    Drawn by [phone]    Sample type ARTERIAL    Allens test (pass/fail) PASS PASS  Triglycerides     Status: None   Collection Time: 11/19/14  7:46 PM  Result Value Ref Range   Triglycerides 60 <150 mg/dL    Comment: Performed at [facility]  Glucose, capillary     Status: Abnormal   Collection Time: 11/19/14  8:35 PM  Result Value Ref Range   Glucose-Capillary 157 (H) 70 - 99 mg/dL  MRSA PCR Screening     Status: None   Collection Time: 11/19/14  9:08 PM  Result Value Ref Range   MRSA by PCR NEGATIVE NEGATIVE    Comment:        The GeneXpert MRSA Assay (FDA approved for NASAL specimens only), is one component of a comprehensive MRSA colonization surveillance program. It is not intended to diagnose MRSA infection nor to guide or monitor treatment for MRSA infections.   CBC     Status: Abnormal   Collection Time: 11/19/14  9:15 PM  Result Value Ref Range   WBC 40.0 (H) 4.0 - 10.5 K/uL    Comment: REPEATED TO VERIFY   RBC 4.21 3.87 - 5.11 MIL/uL   Hemoglobin 11.6 (L) 12.0 - 15.0 g/dL   HCT 36.0 36.0 - 46.0 %   MCV 85.5 78.0 - 100.0 fL   MCH 27.6 26.0 - 34.0 pg   MCHC 32.2 30.0 - 36.0 g/dL   RDW 16.0 (H) 11.5 - 15.5 %   Platelets 246 150 - 400 K/uL  Basic metabolic panel     Status: Abnormal   Collection Time: 11/19/14  9:15 PM  Result Value Ref Range   Sodium 138 135 - 145 mmol/L   Potassium 4.1 3.5 - 5.1 mmol/L   Chloride 107 96 - 112 mmol/L   CO2 22 19 - 32 mmol/L   Glucose, Bld 173 (H) 70 - 99  mg/dL   BUN 17 6 - 23 mg/dL   Creatinine, Ser 0.71 0.50 - 1.10 mg/dL   Calcium 7.9 (L) 8.4 - 10.5 mg/dL   GFR calc non Af Amer >90 >90 mL/min   GFR calc Af  Amer >90 >90 mL/min    Comment: (NOTE) The eGFR has been calculated using the CKD EPI equation. This calculation has not been validated in all clinical situations. eGFR's persistently <90 mL/min signify possible Chronic Kidney Disease.    Anion gap 9 5 - 15  Blood gas, arterial     Status: Abnormal   Collection Time: 11/19/14 10:22 PM  Result Value Ref Range   FIO2 0.40 %   Delivery systems VENTILATOR    Mode PRESSURE REGULATED VOLUME CONTROL    VT 450 mL   Rate 12 resp/min   Peep/cpap 5.0 cm H20   pH, Arterial 7.308 (L) 7.350 - 7.450   pCO2 arterial 44.3 35.0 - 45.0 mmHg   pO2, Arterial 162.0 (H) 80.0 - 100.0 mmHg   Bicarbonate 21.6 20.0 - 24.0 mEq/L   TCO2 20.0 0 - 100 mmol/L   Acid-base deficit 4.2 (H) 0.0 - 2.0 mmol/L   O2 Saturation 98.9 %   Patient temperature 98.0    Collection site ARTERIAL LINE    Drawn by [phone]    Sample type ARTERIAL    Allens test (pass/fail) PASS PASS  Glucose, capillary     Status: Abnormal   Collection Time: 11/20/14  1:12 AM  Result Value Ref Range   Glucose-Capillary 199 (H) 70 - 99 mg/dL   Comment 1 Notify RN    Comment 2 Document in Chart   Glucose, capillary     Status: Abnormal   Collection Time: 11/20/14  3:58 AM  Result Value Ref Range   Glucose-Capillary 194 (H) 70 - 99 mg/dL  Blood gas, arterial     Status: Abnormal   Collection Time: 11/20/14  4:16 AM  Result Value Ref Range   FIO2 0.30 %   Delivery systems VENTILATOR    Mode PRESSURE REGULATED VOLUME CONTROL    VT 450 mL   Rate 12 resp/min   Peep/cpap 5.0 cm H20   pH, Arterial 7.327 (L) 7.350 - 7.450   pCO2 arterial 45.2 (H) 35.0 - 45.0 mmHg   pO2, Arterial 104.0 (H) 80.0 - 100.0 mmHg   Bicarbonate 23.2 20.0 - 24.0 mEq/L   TCO2 21.6 0 - 100 mmol/L   Acid-base deficit 2.4 (H) 0.0 - 2.0 mmol/L   O2 Saturation 97.6  %   Patient temperature 97.8    Collection site ARTERIAL LINE    Drawn by [phone]    Sample type ARTERIAL    Allens test (pass/fail) PASS PASS  Basic metabolic panel     Status: Abnormal   Collection Time: 11/20/14  5:15 AM  Result Value Ref Range   Sodium 136 135 - 145 mmol/L   Potassium 4.1 3.5 - 5.1 mmol/L   Chloride 105 96 - 112 mmol/L   CO2 23 19 - 32 mmol/L   Glucose, Bld 223 (H) 70 - 99 mg/dL   BUN 18 6 - 23 mg/dL   Creatinine, Ser 0.81 0.50 - 1.10 mg/dL   Calcium 7.8 (L) 8.4 - 10.5 mg/dL   GFR calc non Af Amer 77 (L) >90 mL/min   GFR calc Af Amer 90 (L) >90 mL/min    Comment: (NOTE) The eGFR has been calculated using the CKD EPI equation. This calculation has not been validated in all clinical situations. eGFR's persistently <90 mL/min signify possible Chronic Kidney Disease.    Anion gap 8 5 - 15  CBC     Status: Abnormal   Collection Time: 11/20/14  5:15 AM  Result Value Ref  Range   WBC 20.2 (H) 4.0 - 10.5 K/uL   RBC 4.01 3.87 - 5.11 MIL/uL   Hemoglobin 10.9 (L) 12.0 - 15.0 g/dL   HCT 34.3 (L) 36.0 - 46.0 %   MCV 85.5 78.0 - 100.0 fL   MCH 27.2 26.0 - 34.0 pg   MCHC 31.8 30.0 - 36.0 g/dL   RDW 16.7 (H) 11.5 - 15.5 %   Platelets 274 150 - 400 K/uL  Magnesium     Status: Abnormal   Collection Time: 11/20/14  5:15 AM  Result Value Ref Range   Magnesium 1.1 (L) 1.5 - 2.5 mg/dL  Glucose, capillary     Status: Abnormal   Collection Time: 11/20/14  7:45 AM  Result Value Ref Range   Glucose-Capillary 229 (H) 70 - 99 mg/dL   Comment 1 Notify RN    Comment 2 Document in Chart   Glucose, capillary     Status: Abnormal   Collection Time: 11/20/14 11:48 AM  Result Value Ref Range   Glucose-Capillary 165 (H) 70 - 99 mg/dL   Comment 1 Notify RN    Comment 2 Document in Chart   Glucose, capillary     Status: Abnormal   Collection Time: 11/20/14  3:42 PM  Result Value Ref Range   Glucose-Capillary 132 (H) 70 - 99 mg/dL   Comment 1 Notify RN    Comment 2 Document in  Chart   Glucose, capillary     Status: Abnormal   Collection Time: 11/20/14  7:54 PM  Result Value Ref Range   Glucose-Capillary 151 (H) 70 - 99 mg/dL  Basic metabolic panel     Status: Abnormal   Collection Time: 11/21/14  3:32 AM  Result Value Ref Range   Sodium 138 135 - 145 mmol/L   Potassium 4.0 3.5 - 5.1 mmol/L   Chloride 106 96 - 112 mmol/L   CO2 29 19 - 32 mmol/L   Glucose, Bld 108 (H) 70 - 99 mg/dL   BUN 18 6 - 23 mg/dL   Creatinine, Ser 0.64 0.50 - 1.10 mg/dL   Calcium 7.9 (L) 8.4 - 10.5 mg/dL   GFR calc non Af Amer >90 >90 mL/min   GFR calc Af Amer >90 >90 mL/min    Comment: (NOTE) The eGFR has been calculated using the CKD EPI equation. This calculation has not been validated in all clinical situations. eGFR's persistently <90 mL/min signify possible Chronic Kidney Disease.    Anion gap 3 (L) 5 - 15  Magnesium     Status: None   Collection Time: 11/21/14  3:32 AM  Result Value Ref Range   Magnesium 1.9 1.5 - 2.5 mg/dL  CBC with Differential     Status: Abnormal   Collection Time: 11/21/14  3:32 AM  Result Value Ref Range   WBC 9.2 4.0 - 10.5 K/uL   RBC 2.89 (L) 3.87 - 5.11 MIL/uL   Hemoglobin 8.1 (L) 12.0 - 15.0 g/dL    Comment: DELTA CHECK NOTED REPEATED TO VERIFY    HCT 24.9 (L) 36.0 - 46.0 %   MCV 86.2 78.0 - 100.0 fL   MCH 28.0 26.0 - 34.0 pg   MCHC 32.5 30.0 - 36.0 g/dL   RDW 16.4 (H) 11.5 - 15.5 %   Platelets 143 (L) 150 - 400 K/uL    Comment: REPEATED TO VERIFY DELTA CHECK NOTED    Neutrophils Relative % 72 43 - 77 %   Lymphocytes Relative 20 12 - 46 %  Monocytes Relative 8 3 - 12 %   Eosinophils Relative 0 0 - 5 %   Basophils Relative 0 0 - 1 %   Neutro Abs 6.7 1.7 - 7.7 K/uL   Lymphs Abs 1.8 0.7 - 4.0 K/uL   Monocytes Absolute 0.7 0.1 - 1.0 K/uL   Eosinophils Absolute 0.0 0.0 - 0.7 K/uL   Basophils Absolute 0.0 0.0 - 0.1 K/uL    Imaging / Studies: Dg Chest Port 1 View  11/20/2014   CLINICAL DATA:  Endotracheal tube position  EXAM:  PORTABLE CHEST - 1 VIEW  COMPARISON:  08/25/2014  FINDINGS: There is an endotracheal tube with tip between the clavicular heads and carina (tip 18 mm above the carina). There is hypoventilation with interstitial crowding. No edema, effusion, or air leak. Normal heart size and aortic contours.  IMPRESSION: 1. The endotracheal tube is in good position. 2. Hypoventilation.   Electronically Signed   By: Monte Fantasia M.D.   On: 11/20/2014 05:37    Medications / Allergies: per chart  Antibiotics: Anti-infectives    Start     Dose/Rate Route Frequency Ordered Stop   11/19/14 2200  clindamycin (CLEOCIN) IVPB 900 mg     900 mg 100 mL/hr over 30 Minutes Intravenous 3 times per day 11/19/14 1902 11/19/14 2217   11/18/14 1600  clindamycin (CLEOCIN) 900 mg, gentamicin (GARAMYCIN) 240 mg in sodium chloride 0.9 % 1,000 mL for intraperitoneal lavage    Comments:  Pharmacy may adjust dosing strength, schedule, rate of infusion, etc as needed to optimize therapy    Intraperitoneal To Surgery 11/18/14 1559 11/19/14 1015   11/18/14 1559  clindamycin (CLEOCIN) IVPB 900 mg  Status:  Discontinued     900 mg 100 mL/hr over 30 Minutes Intravenous 60 min pre-op 11/18/14 1559 11/20/14 0733   11/18/14 1559  gentamicin (GARAMYCIN) 560 mg in dextrose 5 % 100 mL IVPB     5 mg/kg  111.6 kg 114 mL/hr over 60 Minutes Intravenous 60 min pre-op 11/18/14 1559 11/19/14 1030       Note: Portions of this report may have been transcribed using voice recognition software. Every effort was made to ensure accuracy; however, inadvertent computerized transcription errors may be present.   Any transcriptional errors that result from this process are unintentional.     [name], M.D., F.A.C.S. Gastrointestinal and Minimally Invasive Surgery [facility] [address] [place], [place] [address] [phone] Main / Paging   11/21/2014

## 2014-11-21 NOTE — Progress Notes (Signed)
PULMONARY / CRITICAL CARE MEDICINE   Name: Jarrod Gaby MRN: [id number] DOB: Jan 27, 1956    ADMISSION DATE:  11/19/2014 CONSULTATION DATE:  11/21/2014  REFERRING MD :  Denman [name]   CHIEF COMPLAINT:  Endometrioid adenocarcinoma  INITIAL PRESENTATION:   59 y.o. F with hx of endometrial and ovarian CA s/p TAH and BSO in 2006, evaluated for abd pain, rectal spasm, and occasional vaginal bleeding.  CT revealed large right pelvic mass and transrectal [place] and biopsy revealed endometrioid adenocarcinoma.  Underwent resection on 4/19 and was kept on ventilator overnight.  PCCM consulted for vent management.   STUDIES:  CT A/P 09/12/14 >>> new right pelvic mass abutting the vaginal cuff and rectum, high worrisome for recurrent ovarian CA.  Rectal lesion cannot be excluded.  No other evidence of metastatic disease demonstrated. MRI pelvis 10/14/14 >>> 5.3 cm multilobulated soft tissue mass in the hysterectomy bed c/w recurrent carcinoma. PET [id number] >>> hypermetabolic pelvic soft tissue mass involving the vaginal cuff and pelvic cul-de-sac c/w recurrent carcinoma.  No evidence of pelvic lymph node or distant metastatic disease.  SIGNIFICANT EVENTS: 09/20/14 - vaginal biopsy with squamous mucosa without dysplasia or malignancy identified 11/19/14 - admitted and underwent robotic-assisted lysis of adhesions, converted to laparotomy, radical upper vaginectomy and low anterior resection with colostomy. Bilateral ureteral stent placement and cystotomy repair   SUBJECTIVE:  Up to side of bed, taking a diet, good progress  VITAL SIGNS: Temp:  [97.7 F (36.5 C)-98.3 F (36.8 C)] 98.3 F (36.8 C) (04/21 0700) Pulse Rate:  [79-106] 100 (04/21 0845) Resp:  [11-22] 17 (04/21 0845) BP: (96-150)/(30-91) 137/56 mmHg (04/21 0845) SpO2:  [89 %-100 %] 96 % (04/21 0845) Arterial Line BP: (96-155)/(34-136) 155/136 mmHg (04/20 1500) HEMODYNAMICS:   VENTILATOR SETTINGS:   INTAKE / OUTPUT: Intake/Output      04/20 0701  - 04/21 0700 04/21 0701 - 04/22 0700   P.O. 540    I.V. (mL/kg) 1740 (14.7) 150 (1.3)   Blood     Other 280    IV Piggyback     Total Intake(mL/kg) 2560 (21.7) 150 (1.3)   Urine (mL/kg/hr) 1770 (0.6) 475 (1)   Emesis/NG output 150 (0.1)    Drains 290 (0.1) 30 (0.1)   Stool 50 (0) 0 (0)   Blood     Total Output 2260 505   Net +300 -355          PHYSICAL EXAMINATION: General: obese female awake and sitting up Neuro: A&O x3, non-focal HEENT: [place]/AT, PERRL, no JVD  Cardiovascular: RRR, no MRG Lungs: Clear bilateral breath sounds Musculoskeletal: No acute deformity, ROM limitation, or edema Skin: Grossly intact  LABS:  CBC  Recent Labs Lab 11/19/14 2115 11/20/14 0515 11/21/14 0332  WBC 40.0* 20.2* 9.2  HGB 11.6* 10.9* 8.1*  HCT 36.0 34.3* 24.9*  PLT 246 274 143*   Coag's No results for input(s): APTT, INR in the last 168 hours. BMET  Recent Labs Lab 11/19/14 2115 11/20/14 0515 11/21/14 0332  NA 138 136 138  K 4.1 4.1 4.0  CL 107 105 106  CO2 22 23 29   BUN 17 18 18   CREATININE 0.71 0.81 0.64  GLUCOSE 173* 223* 108*   Electrolytes  Recent Labs Lab 11/19/14 2115 11/20/14 0515 11/21/14 0332  CALCIUM 7.9* 7.8* 7.9*  MG  --  1.1* 1.9   Sepsis Markers No results for input(s): LATICACIDVEN, PROCALCITON, O2SATVEN in the last 168 hours. ABG  Recent Labs Lab 11/19/14 1934 11/19/14 2222 11/20/14 0416  PHART 7.331* 7.308* 7.327*  PCO2ART 40.8 44.3 45.2*  PO2ART 464.0* 162.0* 104.0*   Liver Enzymes No results for input(s): AST, ALT, ALKPHOS, BILITOT, ALBUMIN in the last 168 hours. Cardiac Enzymes No results for input(s): TROPONINI, PROBNP in the last 168 hours. Glucose  Recent Labs Lab 11/20/14 1148 11/20/14 1542 11/20/14 1954 11/20/14 2305 11/21/14 0316 11/21/14 0740  GLUCAP 165* 132* 151* 168* 115* 122*    Imaging Dg Chest Port 1 View  11/20/2014   CLINICAL DATA:  Endotracheal tube position  EXAM: PORTABLE CHEST - 1 VIEW  COMPARISON:   08/25/2014  FINDINGS: There is an endotracheal tube with tip between the clavicular heads and carina (tip 18 mm above the carina). There is hypoventilation with interstitial crowding. No edema, effusion, or air leak. Normal heart size and aortic contours.  IMPRESSION: 1. The endotracheal tube is in good position. 2. Hypoventilation.   Electronically Signed   By: Monte Fantasia M.D.   On: 11/20/2014 05:37      ASSESSMENT / PLAN:  HEMATOLOGIC / ONCOLOGIC A:   Endometrioid adenocarcinoma -  s/p Robotic-assisted lysis of adhesions, radical upper vaginectomy and low anterior resection with colostomy. Bilateral ureteral stent placement and cystotomy repair on 11/19/14 Hx endometrial and ovarian CA s/p TAH and BSO in 2006 - now with recurrence Mild anemia - received blood products during case Chronic neutropenia - presumed related to previous chemo.  Now normal on this admission.  Follows with hematology as outpatient ([facility]) VTE Prophylaxis P:  Post op care per GYN - ONC and CCS. Transfuse per usual ICU guidelines. SCD's  SQ heparin starting 4/20  PULMONARY OETT 4/19 >>>4/20 A: VDRF Hx OSA PCXR w/ low volume. Passed SBT  P:   Extubated 4/20 Wean O2 IS  OOB   CARDIOVASCULAR A:  Hx essential HTN Hypotension - suspect medication related, resolved P:    RENAL A:   Hypomagnesemia   P:   Cont IVF  Replace and recheck lytes.   GASTROINTESTINAL A:   GERD Nutrition P:   Pantoprazole. Adv diet   INFECTIOUS A:   No indication for active infection P:   [place]inda and gentamycin as surgical ppx 4/19 Monitor clinically.  ENDOCRINE A:   DM II Hypothyroidism P:   CBG's q4hr. SSI. Resume outpatient levothyroxine 4/20 Hold outpatient canagliflozin, metformin, sitagliptin - restart when stable post-op  NEUROLOGIC A:   Acute metabolic encephalopathy-->resolved Hx Depression P:   PRN analgesia  Low dose fentanyl Percocet prn    Interdisciplinary Family  Meeting v Palliative Care Meeting:  Due by: 4/26  Recovering well. PCCM will sign off. Please call if we can help you.    Baltazar Apo, MD, PhD 11/21/2014, 11:06 AM [facility] [phone] or if no answer [phone]

## 2014-11-21 NOTE — Progress Notes (Signed)
[name]   DOB:10/27/1955   [id number]   [id number]  Patient Care Team: Leana Gamer, MD as PCP - General (Internal Medicine) [name], MD as Referring Physician (Obstetrics and Gynecology) Heath Lark, MD as Consulting Physician (Hematology and Oncology) Everitt Amber, MD as Consulting Physician (Obstetrics and Gynecology) Michael [name], MD as Consulting Physician (General Surgery)  I have seen the patient, examined her and edited the notes as follows  Subjective: Patient seen and examined. She is slowly recovering from surgery, feeling better from prior day. Denies fevers, chills, night sweats, vision changes, or mucositis. Denies any respiratory complaints. Denies any chest pain or palpitations. Denies lower extremity swelling. Denies nausea, heartburn. Incisional pain is present, controlled with meds. Colostomy pouch functioning well. Appetite is normal, on clear diet. She has a urinary catheter in place. Denies abnormal skin rashes, or neuropathy. Denies any bleeding issues such as epistaxis, hematemesis. She has some serosanguineous fluid from the JP drain. Ambulating minimally due to recent surgery.  Scheduled Meds: . acetaminophen  1,000 mg Oral TID  . alvimopan  12 mg Oral BID  . antiseptic oral rinse  7 mL Mouth Rinse QID  . chlorhexidine  15 mL Mouth Rinse BID  . furosemide  40 mg Intravenous Once  . heparin subcutaneous  5,000 Units Subcutaneous 3 times per day  . HYDROmorphone PCA 0.3 mg/mL   Intravenous 6 times per day  . insulin aspart  0-20 Units Subcutaneous 6 times per day  . levothyroxine  175 mcg Oral QAC breakfast  . lip balm  1 application Topical BID  . metFORMIN  1,000 mg Oral BID WC  . multivitamin with minerals  1 tablet Oral Daily  . pantoprazole  40 mg Oral Q1200  . rosuvastatin  10 mg Oral QPM  . saccharomyces boulardii  250 mg Oral BID  . [START ON 11/22/2014] Tbo-Filgrastim  480 mcg Subcutaneous Weekly   Continuous Infusions: . sodium  chloride 50 mL/hr at 11/21/14 0307   PRN Meds:albuterol, alum & mag hydroxide-simeth, diphenhydrAMINE **OR** diphenhydrAMINE, HYDROmorphone (DILAUDID) injection, lactated ringers, lactated ringers, magic mouthwash, menthol-cetylpyridinium, methocarbamol (ROBAXIN)  IV, naloxone **AND** sodium chloride, ondansetron (ZOFRAN) IV, ondansetron **OR** [DISCONTINUED] ondansetron (ZOFRAN) IV, phenol   Objective:  Filed Vitals:   11/21/14 0700  BP:   Pulse:   Temp: 98.3 F (36.8 C)  Resp:       Intake/Output Summary (Last 24 hours) at 11/21/14 0802 Last data filed at 11/21/14 0700  Gross per 24 hour  Intake 2412.5 ml  Output   2110 ml  Net  302.5 ml    ECOG PERFORMANCE STATUS: 2  GENERAL:alert, no distress and comfortable SKIN: skin color, texture, turgor are normal, no rashes or significant lesions except for bandaged surgical site. EYES: normal, conjunctiva are pink and non-injected, sclera clear OROPHARYNX:no exudate, no erythema and lips, buccal mucosa, and tongue normal  NECK: supple, thyroid normal size, non-tender, without nodularity LYMPH: no palpable lymphadenopathy in the cervical, axillary or inguinal LUNGS: clear to auscultation and percussion with normal breathing effort HEART: regular rate & rhythm and no murmurs and no lower extremity edema ABDOMEN: soft, Incisional tenderness, normal bowel sounds. There is a JP drain at the right lower quadrant, and a colostomy in the left lower quadrant Musculoskeletal:no cyanosis of digits and no clubbing  PSYCH: alert & oriented x 3 with fluent speech NEURO: no focal motor/sensory deficits    CBG (last 3)   Recent Labs  11/20/14 2305 11/21/14 0316 11/21/14 0740  GLUCAP  168* 115* 122*     Labs:   Recent Labs Lab 11/15/14 1059 11/18/14 0821  11/19/14 1606 11/19/14 1722 11/19/14 2115 11/20/14 0515 11/21/14 0332  WBC 4.8 11.0*  --   --   --  40.0* 20.2* 9.2  HGB 13.7 13.1  < > 11.9* 11.9* 11.6* 10.9* 8.1*  HCT  43.8 41.5  < > 35.0* 35.0* 36.0 34.3* 24.9*  PLT 355 286  --   --   --  246 274 143*  MCV 85.6 88.3  --   --   --  85.5 85.5 86.2  MCH 26.9 27.9  --   --   --  27.6 27.2 28.0  MCHC 31.4* 31.6  --   --   --  32.2 31.8 32.5  RDW 17.4* 16.3*  --   --   --  16.0* 16.7* 16.4*  LYMPHSABS 1.8 2.5  --   --   --   --   --  1.8  MONOABS 0.8 1.0*  --   --   --   --   --  0.7  EOSABS 0.1 0.2  --   --   --   --   --  0.0  BASOSABS 0.1 0.1  --   --   --   --   --  0.0  < > = values in this interval not displayed.   Chemistries:    Recent Labs Lab 11/19/14 1606 11/19/14 1722 11/19/14 2115 11/20/14 0515 11/21/14 0332  NA 136 136 138 136 138  K 4.0 4.2 4.1 4.1 4.0  CL  --   --  107 105 106  CO2  --   --  22 23 29   GLUCOSE  --   --  173* 223* 108*  BUN  --   --  17 18 18   CREATININE  --   --  0.71 0.81 0.64  CALCIUM  --   --  7.9* 7.8* 7.9*  MG  --   --   --  1.1* 1.9     Recent Labs Lab 11/20/14 1542 11/20/14 1954 11/20/14 2305 11/21/14 0316 11/21/14 0740  GLUCAP 132* 151* 168* 115* 122*    Imaging Studies:  No new imaging studies.  Assessment/Plan: 59 y.o.   Endometrial and ovarian cancer She was admitted on 4/19 for surgery. She was admitted to Hospital for resection of pelvic mass, radical upper vaginectomy, low anterior rectosigmoid resection, end descending colostomy, bilateral ureteral stent and cystotomy closure by Dr. Denman [name] on 11/19/14. Path pending  Chronic neutropenia, ow with leukocytosis She received G-CSF prior to surgery on 3/25, 4/1, 4/11, 4/15 and 4/18 ANC is 6.7 on 4/21 No intervention indicated at this time, as leukocytosis is likely reactive, as well as due to recent injections with G-CSF Recommend that she receives G-CSF injection to keep her [place] greater than 1500 when needed during hospitalization I have placed a standing order for her to receive G-CSF whenever ANC less than 1500 over the next few days.  Anemia in neoplastic disease Due to recent  surgery with acute blood loss, and malignancy. Patient received 2 units intraoperatively, her hemoglobin is at 8.1 Monitor counts closely May need to transfuse blood to maintain a Hb of 8 as the patient is still producing serosanguineous drainage  DVT prophylaxis On subcutaneous heparin Monitor platelets carefully  Thrombocytopenia This is due to malignancy, dilution, infection, blood loss; she is oh heparin Monitor counts closely No transfusion is indicated at this time Transfuse  1 unit of platelets if count is less or equal than 10,000 or 20,000 if the patient is acutely bleeding Hold  Heparin if  platelets drop to less than 50,000  Other medical issues as per admitting team   Full Code  I will be away for conference tomorrow and the weekend. I'll return on Monday to check on her. Rondel Jumbo, PA-C 11/21/2014  8:02 AM  Brock Larmon, MD 11/21/2014

## 2014-11-21 NOTE — Consult Note (Signed)
WOC ostomy follow up Stoma type/location: LLQ, colostomy Stomal assessment/size:  Peristomal assessment: pouch changed this am prior to ostomy nurse arrival  Treatment options for stomal/peristomal skin: not assessed today Output flatus, 50cc recorded but appears just bloody drainage at this time.  Ostomy pouching: 2pc.  Education provided:  Left colostomy and lifestyle educational materials in the patients room.  At the time of my visit she has several nurse friends visiting her and she is eating her lunch.  She reports flatus and that she has not looked at the stoma yet and "has to work up to that".   Offered support and supplies placed in her room for continued education and pouch changes.  Enrolled patient in [facility] Discharge program: No  WOC team will follow along with you for ostomy care and education. [place], [place]

## 2014-11-21 NOTE — Progress Notes (Signed)
2 Days Post-Op Procedure(s) (LRB): ROBOTIC LYSIS OF ADHESIONS, CONVERTED TO LAPAROTOMY RADICAL UPPER VAGINECTOMY,LOW ANTERIOR BOWEL RESECTION, COLOSTOMY, BILATERAL URETERAL STENT PLACEMENT AND CYSTONOMY CLOSURE (N/A) XI ROBOTIC ASSISTED LOWER ANTERIOR RESECTION (N/A) OSTOMY (N/A) Events overnight/24 hrs: extubated yesterday  Subjective: Patient reports some pain. Flatus in bag. Tolerating clears. Belching.    Objective: Vital signs in last 24 hours: Temp:  [97.4 F (36.3 C)-98.3 F (36.8 C)] 97.4 F (36.3 C) (04/21 1100) Pulse Rate:  [79-106] 104 (04/21 1231) Resp:  [11-24] 24 (04/21 1231) BP: (96-150)/(30-91) 131/51 mmHg (04/21 1231) SpO2:  [89 %-100 %] 97 % (04/21 1231) Arterial Line BP: (105-155)/(45-136) 155/136 mmHg (04/20 1500) Last BM Date:  (colostomy)  Intake/Output from previous day: 04/20 0701 - 04/21 0700 In: 2546.7 [P.O.:540; I.V.:1726.7] Out: 2260 [Urine:1770; Emesis/NG output:150; Drains:290; Stool:50]  Physical Examination:  General: alert and cooperative Resp: clear to auscultation bilaterally Cardio: regular rate and rhythm, S1, S2 normal, no murmur, click, rub or gallop GI: soft, non-tender; bowel sounds normal; no masses,  no organomegaly and incision: clean, intact and bloody and small volume drainage present Extremities: extremities normal, atraumatic, no cyanosis or edema Vaginal Bleeding: none  Colostomy: pink, viable, bag sweat only Drain from RLQ (in pelvis): serosanguinous output.   Labs: WBC/Hgb/Hct/Plts:  9.2/8.1/24.9/143 (04/21 0332) BUN/Cr/glu/ALT/AST/amyl/lip:  18/0.64/--/--/--/--/-- (04/21 0332)   BMET    Component Value Date/Time   NA 138 11/21/2014 0332   NA 141 09/12/2014 0857   K 4.0 11/21/2014 0332   K 4.0 09/12/2014 0857   CL 106 11/21/2014 0332   CO2 29 11/21/2014 0332   CO2 28 09/12/2014 0857   GLUCOSE 108* 11/21/2014 0332   GLUCOSE 121 09/12/2014 0857   BUN 18 11/21/2014 0332   BUN 15.4 09/12/2014 0857   CREATININE 0.64  11/21/2014 0332   CREATININE 0.7 09/12/2014 0857   CREATININE 0.84 08/25/2014 1538   CALCIUM 7.9* 11/21/2014 0332   CALCIUM 9.5 09/12/2014 0857   GFRNONAA >90 11/21/2014 0332   GFRNONAA 76 08/25/2014 1538   GFRAA >90 11/21/2014 0332   GFRAA 87 08/25/2014 1538     Assessment:  59 y.o. s/p Procedure(s): ROBOTIC LYSIS OF ADHESIONS, CONVERTED TO LAPAROTOMY RADICAL UPPER VAGINECTOMY,LOW ANTERIOR BOWEL RESECTION, COLOSTOMY, BILATERAL URETERAL STENT PLACEMENT AND CYSTONOMY CLOSURE XI ROBOTIC ASSISTED LOWER ANTERIOR RESECTION OSTOMY: stable Pain:  Pain is well-controlled on prn medications.  Pulm: Currently intubated as postop precaution secondary to long case and high EBL. Recommend checking weaning parameters and extubating this morning.  Heme:Anemia: appropriate blood loss anemia secondary to surgical losses. Post op Hb is appropriate today. No evidence for ongoing bleeding. S/p t/f 2 units intraop.  ID: Chronic idiopathic neutropenia. S/p Granix injection. Counts acceptabe at present. Will follow Dr Calton Dach recommendations - appreciate. Remove Incision wound wicks (x4) on POD 3  CV: Hypotension: mild, and with mild acidosis on ABG, likely secondary to volume contraction.  begin routine home BP meds when BP supports their introduction.[name] Kitchen  GI:  Tolerating po: yes. Diet advnaced to full liquids today. Colostomy: appears viable and well perfused, functioning with gas.  GU: patient had complex cystotomy repair (trigone and base) and ureteral stenting. DO NOT REMOVE FOLEY CATHETER - will perform retrograde cystogram at 2-3 weeks and will d.c foley at that time IF no leak noted. Will check JP creatinine prior to removing drain (if elevated above serum levels would keep JP in until foley comes out/bladder integrity confirmed on imaging). Remove ureteral stents at 6 weeks if no stricture/leak on CT urogram.  Endo: Diabetes mellitus Type II, under good control..  CBG: continue sliding scale. Will  restart diabetic meds if tolerating PO tomorrow.  Oncology/GYN: recurrent endometrial cancer. Complete resection, margin status pending. Recommend adjuvant radiation after patient's GU system has adequately healed. Prognosis is overall good (curative intent therapy).  Prophylaxis: pharmacologic prophylaxis (with any of the following: unfractionated SQ heparin 5000 units 2 hours prior to surgery then every 12 hours).  Plan: Advance diet Encourage ambulation Advance to PO medication  Transfer to regular surgical floor bed Continue foley for at least 2-3 weeks (pending imaging results). Continue JP drain. Dispo:  Discharge plan to include: eventual discharge to home (anticipate POD 5-6). The patient is to be discharged to home.   LOS: 2 days    Donaciano Eva 11/21/2014, 1:57 PM

## 2014-11-22 LAB — CBC WITH DIFFERENTIAL/PLATELET
BASOS PCT: 0 % (ref 0–1)
Basophils Absolute: 0 10*3/uL (ref 0.0–0.1)
EOS ABS: 0.1 10*3/uL (ref 0.0–0.7)
EOS PCT: 2 % (ref 0–5)
HEMATOCRIT: 23.5 % — AB (ref 36.0–46.0)
HEMOGLOBIN: 7.4 g/dL — AB (ref 12.0–15.0)
Lymphocytes Relative: 36 % (ref 12–46)
Lymphs Abs: 1.8 10*3/uL (ref 0.7–4.0)
MCH: 27.4 pg (ref 26.0–34.0)
MCHC: 31.5 g/dL (ref 30.0–36.0)
MCV: 87 fL (ref 78.0–100.0)
MONO ABS: 0.5 10*3/uL (ref 0.1–1.0)
MONOS PCT: 11 % (ref 3–12)
NEUTROS ABS: 2.5 10*3/uL (ref 1.7–7.7)
Neutrophils Relative %: 51 % (ref 43–77)
Platelets: 137 10*3/uL — ABNORMAL LOW (ref 150–400)
RBC: 2.7 MIL/uL — ABNORMAL LOW (ref 3.87–5.11)
RDW: 16.3 % — AB (ref 11.5–15.5)
WBC: 4.9 10*3/uL (ref 4.0–10.5)

## 2014-11-22 LAB — BASIC METABOLIC PANEL
Anion gap: 6 (ref 5–15)
BUN: 12 mg/dL (ref 6–23)
CHLORIDE: 100 mmol/L (ref 96–112)
CO2: 31 mmol/L (ref 19–32)
CREATININE: 0.5 mg/dL (ref 0.50–1.10)
Calcium: 8 mg/dL — ABNORMAL LOW (ref 8.4–10.5)
GLUCOSE: 115 mg/dL — AB (ref 70–99)
POTASSIUM: 3.3 mmol/L — AB (ref 3.5–5.1)
Sodium: 137 mmol/L (ref 135–145)

## 2014-11-22 LAB — GLUCOSE, CAPILLARY
GLUCOSE-CAPILLARY: 143 mg/dL — AB (ref 70–99)
Glucose-Capillary: 104 mg/dL — ABNORMAL HIGH (ref 70–99)
Glucose-Capillary: 108 mg/dL — ABNORMAL HIGH (ref 70–99)
Glucose-Capillary: 128 mg/dL — ABNORMAL HIGH (ref 70–99)
Glucose-Capillary: 150 mg/dL — ABNORMAL HIGH (ref 70–99)
Glucose-Capillary: 164 mg/dL — ABNORMAL HIGH (ref 70–99)

## 2014-11-22 LAB — HEMOGLOBIN A1C
HEMOGLOBIN A1C: 6.8 % — AB (ref 4.8–5.6)
MEAN PLASMA GLUCOSE: 148 mg/dL

## 2014-11-22 MED ORDER — SODIUM CHLORIDE 0.9 % IJ SOLN
3.0000 mL | Freq: Two times a day (BID) | INTRAMUSCULAR | Status: DC
  Administered 2014-11-22 – 2014-11-25 (×7): 3 mL via INTRAVENOUS

## 2014-11-22 MED ORDER — OXYCODONE HCL 5 MG PO TABS
5.0000 mg | ORAL_TABLET | ORAL | Status: DC | PRN
  Administered 2014-11-22 – 2014-11-25 (×16): 10 mg via ORAL
  Filled 2014-11-22 (×16): qty 2

## 2014-11-22 MED ORDER — SODIUM CHLORIDE 0.9 % IJ SOLN: 3.0000 mL | INTRAMUSCULAR | Status: DC | PRN

## 2014-11-22 MED ORDER — LACTATED RINGERS IV BOLUS (SEPSIS): 1000.0000 mL | Freq: Three times a day (TID) | INTRAVENOUS | Status: AC | PRN

## 2014-11-22 MED ORDER — POTASSIUM CHLORIDE CRYS ER 20 MEQ PO TBCR
20.0000 meq | EXTENDED_RELEASE_TABLET | ORAL | Status: DC | PRN
  Administered 2014-11-22: 20 meq via ORAL
  Filled 2014-11-22: qty 1

## 2014-11-22 MED ORDER — POTASSIUM CHLORIDE 10 MEQ/50ML IV SOLN
10.0000 meq | INTRAVENOUS | Status: DC | PRN
  Filled 2014-11-22: qty 50

## 2014-11-22 MED ORDER — SODIUM CHLORIDE 0.9 % IV SOLN: 250.0000 mL | INTRAVENOUS | Status: DC | PRN

## 2014-11-22 NOTE — Plan of Care (Signed)
Problem: Phase I Progression Outcomes Goal: Voiding-avoid urinary catheter unless indicated Outcome: Not Progressing Patient will go home with foley catheter in place.

## 2014-11-22 NOTE — Progress Notes (Signed)
This note also relates to the following rows which could not be included: Patient Position (if appropriate) - Cannot attach notes to completed rows   These vitals where done at 8:15 pm

## 2014-11-22 NOTE — Progress Notes (Signed)
Surgical dressing removed. Saturated with what looks like old serous drainage,4 wicks removed with no problem, staples in place, gauze dressing applied for now ,still with some serous drainage noted , dressing around JP drain also changed . New colostomy flange and pouch applied.

## 2014-11-22 NOTE — Progress Notes (Signed)
[facility]  [address] [place], [place] [address] Phone: [phone] FAX: [phone]    [name] [id number] February 26, 1956  CARE TEAM:  PCP: [name]., MD  Outpatient Care Team: Patient Care Team: [name] Gamer, MD as PCP - General (Internal Medicine) [name] Records, MD as Referring Physician (Obstetrics and Gynecology) [name] Lark, MD as Consulting Physician (Hematology and Oncology) [name] Amber, MD as Consulting Physician (Obstetrics and Gynecology) [name] [name], MD as Consulting Physician (General Surgery)  Inpatient Treatment Team: Treatment Team: Attending Provider: Everitt Amber, MD; Physician Assistant: [name] Jumbo, PA-C; Consulting Physician: [name] Lark, MD; Consulting Physician: Md Pccm, MD; Nurse Practitioner: [name] Gibbs, NP; Registered Nurse: [name] Medici, RN; Consulting Physician: [name] [name], MD; Registered Nurse: [name] Mola, RN; Registered Nurse: [name] Grills, RN; Registered Nurse: [name] Ivory, RN  Problem List:   Active Problems:   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 s/p resection/partial vaginectomy/ LAR/colostomy 11/19/2014   History of ovarian cancer   Acute respiratory failure with hypoxia   Endotracheally intubated   Morbid obesity   3 Days Post-Op  Procedure(s): POST-OPERATIVE DIAGNOSIS: 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  PROCEDURE:  LAPAROSCOPIC/ROBOTIC LYSIS OF ADHESIONS*^ EXPLORATORY LAPAROTOMY^ RESECTION OF PELVIC MASS^ RADICAL UPPER VAGINECTOMY^ LOW ANTERIOR RECTOSIGMOID RESECTION* END DESCENDING COLOSTOMY* BILATERAL URETERAL STENT PLACEMENT^ CYSTOTOMY CLOSURE^  SURGEON: : [name] Amber, MD^ [name] [name], MD*  ASSISTANT: [name] Crocker, MD  PATHOLOGY: Colon, segmental resection for tumor, rectosigmoid colon with upper vagina  RECURRENT ENDOMETRIOID CARCINOMA WITH SQUAMOUS DIFFERENTIATION, INVOLVING THE COLONIC MUCOSA AND  VAGINAL MUCOSA. TWO LYMPH NODES, NEGATIVE FOR ATYPIA OR MALIGNANCY (0/2). RESECTION MARGINS, NEGATIVE FOR ATYPIA OR MALIGNANCY.  Microscopic Comment Please see [name] description for details. [name] LI MD Pathologist, Electronic Signature (Case signed 11/21/2014) Specimen [name] and Clinical Information Specimen(s) Obtained: Colon, segmental resection for tumor, rectosigmoid colon with upper vagina Specimen Clinical Information endometrial cancer (kp) [name] The specimen is received in formalin labeled rectosigmoid colon and upper vagina, and consists of an 18.1 cm in length portion of colon, with the proximal margin stapled and the distal margin open. On the anterior aspect of the colon, there is a 2.7 x 2.6 cm slightly disrupted circular portion of gray-brown mucosa designated by a stitch, which is clinically stated as posterior vaginal wall. The resection margin surrounding the vaginal tissue is inked black, and the distal resection margin is inked black for orientation. The soft tissue surrounding the vaginal tissue along the right side is slightly firm. The lumen of the colon contains a small amount of green-brown fecal material, and the mucosa is tan-pink with normal folding. No mucosal lesions are grossly identified, however, palpating the colon, which overlies the indurated adipose tissue reveals a firm nodule within the possible submucosa and muscularis. The nodule measures approximately 0.6 cm, and is 2.9 cm from the distal resection margin. Sectioning through the distal aspect of the colon and underlying soft tissue reveals a 4.6 x 4.5 x 4.0 cm tan-red, hemorrhagic, softened lesion within the 1 of 2 FINAL for [name] ([id number]) [name](continued) underlying adipose tissue. The lesion invades into the overlying muscularis propria at the area previously noted. The colon wall measures 0.5 cm in thickness. Three tan-pink possible lymph nodes are identified, ranging from  0.2 cm to 0.5 cm in greatest dimension. Please note the patient is status post chemotherapy. Representative sections are submitted in twelve cassettes. A = proximal resection margin. B, C = distal resection margin. D -  F = lesion to overlying colon. G, H = lesion. I, J = vaginal mucosa. K = uninvolved mucosa. L = three possible lymph nodes. (KL:ecj 11/20/2014) Report signed out from the following location(s) Technical component and interpretation was performed at [place] w flatus.  Pt will try solids for lunch per colon pathway  Remove dressing per colon pathway  Anemia.  Low Hgb - follow - transfuse if Hgb <7 or symptomatic if GynOnc [name]/LJM agree  Pain control -  Continue tylenol RTC.  Wean PCA & change to PO oxycodone w IV Dilaudid backup   Colostomy care.  WOCN involved  Foley - DO NOT REMOVE due to bladder repair & ureteral stenting.  She had complex cystotomy repair (trigone and base) and ureteral stenting. DO NOT REMOVE FOLEY CATHETER - will perform retrograde cystogram at 2-3 weeks and will d.c foley at that time IF no leak noted.   DRAIN:  Per [name] / Gyn Onc, near end of hospital stay (?Mon),  check JP creatinine prior to removing drain (if elevated above serum levels would keep JP in until foley comes out/bladder integrity confirmed on imaging). Remove ureteral stents at 6 weeks if no stricture/leak on CT urogram.  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 TWO LYMPH NODES, NEGATIVE FOR ATYPIA OR MALIGNANCY (0/2). I am skeptical that only 2 LN were found in 18cm of intact mesorectum & mesocolon although not a colon adenoCA = may not be usually drainage point for the recurrent endometriod CA  RESECTION MARGINS, NEGATIVE FOR ATYPIA OR MALIGNANCY.  Improve glc control - resistent SSI.  Restart metformin  HTN - OK off meds -  restart gradually.  GERD - protonix daily -VTE prophylaxis- SCDs, etc  -mobilize as tolerated to help recovery  I updated the patient's status to the patient & ICU RN Hoyle Sauer.  Recommendations were made.  Questions were answered.  The patient expressed understanding & appreciation.   [name], M.D., F.A.C.S. Gastrointestinal and Minimally Invasive Surgery [facility] [address], [place], [place] [address] [phone] Main / Paging   11/22/2014  Subjective:  Moved to 3W onc floor Pain minimized w Dilaudid PCA.  Fent never worked well for her [employer] in hallways  Objective:  Vital signs:  Filed Vitals:   11/22/14 0400 11/22/14 0451 11/22/14 0554 11/22/14 0556  BP:  136/60    Pulse:  82    Temp:  98.5 F (36.9 C)    TempSrc:  Oral    Resp: 17 14 15    Height:      Weight:    116.892 kg (257 lb 11.2 oz)  SpO2: 100% 100% 93%     Last BM Date:  (PTA)  Intake/Output   Yesterday:  04/21 0701 - 04/22 0700 In: 373 [P.O.:240; I.V.:133] Out: 3220 [Urine:2950; Drains:270] This shift:     Bowel function:  Flatus: large volume  BM: n  Drain: thinly serosanguinous  Physical Exam:  General: Pt awake/alert/oriented in no acute distress.  Smiling, chatty Eyes: PERRL, normal EOM.  Sclera clear.  No icterus Neuro: CN II-XII intact w/o focal sensory/motor deficits. Lymph: No head/neck/groin lymphadenopathy Psych:  No delerium/psychosis/paranoia HENT: Normocephalic, Mucus membranes moist.  No thrush.  ETT in place Neck: Supple, No tracheal deviation Chest: No chest wall pain w good excursion CV:  Pulses intact.  Regular rhythm  MS: Normal AROM mjr joints.  No obvious deformity Abdomen: Soft.  Nondistended.  Dressings w old blood.  Mildly tender at incisions only.  No evidence of peritonitis.  No incarcerated hernias. Ext:  SCDs BLE.  No mjr edema.  No cyanosis Skin: No petechiae / purpura  Results:    Labs: Results for orders placed or performed during the hospital encounter of 11/19/14 (from the past 48 hour(s))  Glucose, capillary     Status: Abnormal   Collection Time: 11/20/14  7:45 AM  Result Value Ref Range   Glucose-Capillary 229 (H) 70 - 99 mg/dL   Comment 1 Notify RN    Comment 2 Document in Chart   Glucose, capillary     Status: Abnormal   Collection Time: 11/20/14 11:48 AM  Result Value Ref Range   Glucose-Capillary 165 (H) 70 - 99 mg/dL   Comment 1 Notify RN    Comment 2 Document in Chart   Glucose, capillary     Status: Abnormal   Collection Time: 11/20/14  3:42 PM  Result Value Ref Range   Glucose-Capillary 132 (H) 70 - 99 mg/dL   Comment 1 Notify RN    Comment 2 Document in Chart   Glucose, capillary     Status: Abnormal   Collection Time: 11/20/14  7:54 PM  Result Value Ref Range   Glucose-Capillary 151 (H) 70 - 99 mg/dL  Glucose, capillary     Status: Abnormal   Collection Time: 11/20/14 11:05 PM  Result Value Ref Range   Glucose-Capillary 168 (H) 70 - 99 mg/dL   Comment 1 Notify RN    Comment 2 Document in Chart   Glucose, capillary     Status: Abnormal   Collection Time: 11/21/14  3:16 AM  Result Value Ref Range   Glucose-Capillary 115 (H) 70 - 99 mg/dL  Basic metabolic panel     Status: Abnormal   Collection Time: 11/21/14  3:32 AM  Result Value Ref Range   Sodium 138 135 - 145 mmol/L   Potassium 4.0 3.5 - 5.1 mmol/L   Chloride 106 96 - 112 mmol/L   CO2 29 19 - 32 mmol/L   Glucose, Bld 108 (H) 70 - 99 mg/dL   BUN 18 6 - 23 mg/dL   Creatinine, Ser 0.64 0.50 - 1.10 mg/dL   Calcium 7.9 (L) 8.4 - 10.5 mg/dL   GFR calc non Af Amer >90 >90 mL/min   GFR calc Af Amer >90 >90 mL/min    Comment: (NOTE) The eGFR has been calculated using the CKD EPI equation. This calculation has not been validated in all clinical situations. eGFR's persistently <90 mL/min signify possible Chronic Kidney Disease.    Anion gap 3 (L) 5 - 15  Magnesium     Status:  None   Collection Time: 11/21/14  3:32 AM  Result Value Ref Range   Magnesium 1.9 1.5 - 2.5 mg/dL  Hemoglobin A1c     Status: Abnormal   Collection Time: 11/21/14  3:32 AM  Result Value Ref Range   Hgb A1c MFr Bld 6.8 (H) 4.8 - 5.6 %    Comment: (NOTE)         Pre-diabetes: 5.7 - 6.4         Diabetes: >6.4         Glycemic control for adults with diabetes: <7.0    Mean Plasma Glucose 148 mg/dL    Comment: (NOTE) Performed At: [facility] [address] [place], [place] [id number]  Lindon Romp MD OA:[id number]   CBC with Differential     Status: Abnormal   Collection Time: 11/21/14  3:32 AM  Result Value Ref Range   WBC 9.2 4.0 - 10.5 K/uL   RBC 2.89 (L) 3.87 - 5.11 MIL/uL   Hemoglobin 8.1 (L) 12.0 - 15.0 g/dL    Comment: DELTA CHECK NOTED REPEATED TO VERIFY    HCT 24.9 (L) 36.0 - 46.0 %   MCV 86.2 78.0 - 100.0 fL   MCH 28.0 26.0 - 34.0 pg   MCHC 32.5 30.0 - 36.0 g/dL   RDW 16.4 (H) 11.5 - 15.5 %   Platelets 143 (L) 150 - 400 K/uL    Comment: REPEATED TO VERIFY DELTA CHECK NOTED    Neutrophils Relative % 72 43 - 77 %   Lymphocytes Relative 20 12 - 46 %   Monocytes Relative 8 3 - 12 %   Eosinophils Relative 0 0 - 5 %   Basophils Relative 0 0 - 1 %   Neutro Abs 6.7 1.7 - 7.7 K/uL   Lymphs Abs 1.8 0.7 - 4.0 K/uL   Monocytes Absolute 0.7 0.1 - 1.0 K/uL   Eosinophils Absolute 0.0 0.0 - 0.7 K/uL   Basophils Absolute 0.0 0.0 - 0.1 K/uL  Glucose, capillary     Status: Abnormal   Collection Time: 11/21/14  7:40 AM  Result Value Ref Range   Glucose-Capillary 122 (H) 70 - 99 mg/dL  Glucose, capillary     Status: Abnormal   Collection Time: 11/21/14 11:07 AM  Result Value Ref Range   Glucose-Capillary 178 (H) 70 - 99 mg/dL  Glucose, capillary     Status: Abnormal   Collection Time: 11/21/14  4:34 PM  Result Value Ref Range   Glucose-Capillary 145 (H) 70 - 99 mg/dL  Glucose, capillary     Status: Abnormal   Collection Time: 11/21/14  8:16 PM  Result Value  Ref Range   Glucose-Capillary 194 (H) 70 - 99 mg/dL   Comment 1 Notify RN    Comment 2 Document in Chart   Glucose, capillary     Status: Abnormal   Collection Time: 11/21/14 10:15 PM  Result Value Ref Range   Glucose-Capillary 147 (H) 70 - 99 mg/dL  Glucose, capillary     Status: Abnormal   Collection Time: 11/22/14 12:48 AM  Result Value Ref Range   Glucose-Capillary 108 (H) 70 - 99 mg/dL  Basic metabolic panel     Status: Abnormal   Collection Time: 11/22/14  4:35 AM  Result Value Ref Range   Sodium 137 135 - 145 mmol/L   Potassium 3.3 (L) 3.5 - 5.1 mmol/L    Comment: DELTA CHECK NOTED REPEATED TO VERIFY NO VISIBLE HEMOLYSIS    Chloride 100 96 - 112 mmol/L   CO2 31 19 - 32 mmol/L   Glucose, Bld 115 (H) 70 - 99 mg/dL   BUN 12 6 - 23 mg/dL   Creatinine, Ser 0.50 0.50 - 1.10 mg/dL   Calcium 8.0 (L) 8.4 - 10.5 mg/dL   GFR calc non Af Amer >90 >90 mL/min   GFR calc Af Amer >90 >90 mL/min    Comment: (NOTE) The eGFR has been calculated using the CKD EPI equation. This calculation has not been validated in all clinical situations. eGFR's persistently <90 mL/min signify possible Chronic Kidney Disease.    Anion gap 6 5 - 15  CBC with Differential     Status: Abnormal   Collection Time:  11/22/14  4:35 AM  Result Value Ref Range   WBC 4.9 4.0 - 10.5 K/uL   RBC 2.70 (L) 3.87 - 5.11 MIL/uL   Hemoglobin 7.4 (L) 12.0 - 15.0 g/dL   HCT 23.5 (L) 36.0 - 46.0 %   MCV 87.0 78.0 - 100.0 fL   MCH 27.4 26.0 - 34.0 pg   MCHC 31.5 30.0 - 36.0 g/dL   RDW 16.3 (H) 11.5 - 15.5 %   Platelets 137 (L) 150 - 400 K/uL   Neutrophils Relative % 51 43 - 77 %   Neutro Abs 2.5 1.7 - 7.7 K/uL   Lymphocytes Relative 36 12 - 46 %   Lymphs Abs 1.8 0.7 - 4.0 K/uL   Monocytes Relative 11 3 - 12 %   Monocytes Absolute 0.5 0.1 - 1.0 K/uL   Eosinophils Relative 2 0 - 5 %   Eosinophils Absolute 0.1 0.0 - 0.7 K/uL   Basophils Relative 0 0 - 1 %   Basophils Absolute 0.0 0.0 - 0.1 K/uL  Glucose, capillary      Status: Abnormal   Collection Time: 11/22/14  4:35 AM  Result Value Ref Range   Glucose-Capillary 104 (H) 70 - 99 mg/dL    Imaging / Studies: No results found.  Medications / Allergies: per chart  Antibiotics: Anti-infectives    Start     Dose/Rate Route Frequency Ordered Stop   11/19/14 2200  clindamycin (CLEOCIN) IVPB 900 mg     900 mg 100 mL/hr over 30 Minutes Intravenous 3 times per day 11/19/14 1902 11/19/14 2217   11/18/14 1600  clindamycin (CLEOCIN) 900 mg, gentamicin (GARAMYCIN) 240 mg in sodium chloride 0.9 % 1,000 mL for intraperitoneal lavage    Comments:  Pharmacy may adjust dosing strength, schedule, rate of infusion, etc as needed to optimize therapy    Intraperitoneal To Surgery 11/18/14 1559 11/19/14 1015   11/18/14 1559  clindamycin (CLEOCIN) IVPB 900 mg  Status:  Discontinued     900 mg 100 mL/hr over 30 Minutes Intravenous 60 min pre-op 11/18/14 1559 11/20/14 0733   11/18/14 1559  gentamicin (GARAMYCIN) 560 mg in dextrose 5 % 100 mL IVPB     5 mg/kg  111.6 kg 114 mL/hr over 60 Minutes Intravenous 60 min pre-op 11/18/14 1559 11/19/14 1030       Note: Portions of this report may have been transcribed using voice recognition software. Every effort was made to ensure accuracy; however, inadvertent computerized transcription errors may be present.   Any transcriptional errors that result from this process are unintentional.     [name], M.D., F.A.C.S. Gastrointestinal and Minimally Invasive Surgery [facility] [address] [place], [place] [address] [phone] Main / Paging   11/22/2014

## 2014-11-22 NOTE — Progress Notes (Signed)
  Chaplain visited patient and patient asked for prayer. Chaplain prayed a prayer of comfort. Patient was getting her hair done and she said "it was to make her feel better". Patient is moving to the fifth floor. Chaplain told patient that she will be visited on the fifth floor.

## 2014-11-22 NOTE — Progress Notes (Signed)
3 Days Post-Op Procedure(s) (LRB): ROBOTIC LYSIS OF ADHESIONS, CONVERTED TO LAPAROTOMY RADICAL UPPER VAGINECTOMY,LOW ANTERIOR BOWEL RESECTION, COLOSTOMY, BILATERAL URETERAL STENT PLACEMENT AND CYSTONOMY CLOSURE (N/A) XI ROBOTIC ASSISTED LOWER ANTERIOR RESECTION (N/A) OSTOMY (N/A) Events overnight/24 hrs: transferred to floor (medical oncology)  Subjective: Patient reports some pain. More flatus in bag. Tolerating fulls. Ambulating. No dizzyness SOB or weakness on ambulation  Objective: Vital signs in last 24 hours: Temp:  [97.4 F (36.3 C)-98.5 F (36.9 C)] 98.5 F (36.9 C) (04/22 0451) Pulse Rate:  [80-104] 82 (04/22 0451) Resp:  [14-24] 15 (04/22 0554) BP: (126-136)/(51-61) 136/60 mmHg (04/22 0451) SpO2:  [93 %-100 %] 93 % (04/22 0554) Weight:  [257 lb 11.2 oz OR:5502708 kg)] 257 lb 11.2 oz (116.892 kg) (04/22 0556) Last BM Date:  (PTA)  Intake/Output from previous day: 04/21 0701 - 04/22 0700 In: 465 [P.O.:240; I.V.:225] Out: 3220 [Urine:2950; Drains:270]  Physical Examination:  General: alert and cooperative Resp: clear to auscultation bilaterally Cardio: regular rate and rhythm, S1, S2 normal, no murmur, click, rub or gallop GI: soft, non-tender; bowel sounds normal; no masses,  no organomegaly and incision: clean, intact and bloody and small volume drainage present Extremities: extremities normal, atraumatic, no cyanosis or edema Vaginal Bleeding: none  Colostomy: pink, viable, flatus, no stool Drain from RLQ (in pelvis): serosanguinous output.   Labs: WBC/Hgb/Hct/Plts:  4.9/7.4/23.5/137 (04/22 0435) BUN/Cr/glu/ALT/AST/amyl/lip:  12/0.50/--/--/--/--/-- (04/22 0435)   BMET    Component Value Date/Time   NA 137 11/22/2014 0435   NA 141 09/12/2014 0857   K 3.3* 11/22/2014 0435   K 4.0 09/12/2014 0857   CL 100 11/22/2014 0435   CO2 31 11/22/2014 0435   CO2 28 09/12/2014 0857   GLUCOSE 115* 11/22/2014 0435   GLUCOSE 121 09/12/2014 0857   BUN 12 11/22/2014 0435   BUN 15.4 09/12/2014 0857   CREATININE 0.50 11/22/2014 0435   CREATININE 0.7 09/12/2014 0857   CREATININE 0.84 08/25/2014 1538   CALCIUM 8.0* 11/22/2014 0435   CALCIUM 9.5 09/12/2014 0857   GFRNONAA >90 11/22/2014 0435   GFRNONAA 76 08/25/2014 1538   GFRAA >90 11/22/2014 0435   GFRAA 87 08/25/2014 1538     Assessment:  59 y.o. s/p Procedure(s): ROBOTIC LYSIS OF ADHESIONS, CONVERTED TO LAPAROTOMY RADICAL UPPER VAGINECTOMY,LOW ANTERIOR BOWEL RESECTION, COLOSTOMY, BILATERAL URETERAL STENT PLACEMENT AND CYSTONOMY CLOSURE XI ROBOTIC ASSISTED LOWER ANTERIOR RESECTION OSTOMY: stable Pain:  Pain is well-controlled on prn medications. D.c. PCA today and start orals (Dr Johney Maine has initiated this).  Pulm: No issues  Heme:Anemia: appropriate blood loss anemia secondary to surgical losses. Post op Hb is continuing to fall slowly ,but Intraperitoneal drain is low volume and serosanguinous (not frank blood). No evidence for ongoing bleeding. Not symptomatic, would hold off transfusing right now unless becomes symptomatic.  ID: Chronic idiopathic neutropenia. S/p Granix injection. Counts acceptabe at present. Will follow Dr Calton Dach recommendations - appreciate. Remove Incision wound wicks (x4) today  CV: Hx of HTN  begin routine home BP meds when BP supports their introduction.[name] Kitchen  GI:  Tolerating po: yes. Continue to advance. Colostomy: appears viable and well perfused, functioning with gas. Continue ostomy teaching.  GU: patient had complex cystotomy repair (trigone and base) and ureteral stenting. DO NOT REMOVE FOLEY CATHETER - will perform retrograde cystogram at 2-3 weeks and will d.c foley at that time IF no leak noted. Will check JP creatinine prior to removing drain (if elevated above serum levels would keep JP in until foley comes out/bladder integrity confirmed on  imaging). Remove ureteral stents at 6 weeks if no stricture/leak on CT urogram.  Endo: Diabetes mellitus Type II, under good  control..  CBG: continue sliding scale. Will restart diabetic meds today  Oncology/GYN: recurrent endometrial cancer. Complete resection, margins negative (recurrent endometrioid tumor on final path, involved vaginal and rectal mucosa). Recommend adjuvant radiation after patient's GU system has adequately healed. Prognosis is overall good (curative intent therapy).  Prophylaxis: pharmacologic prophylaxis (with any of the following: unfractionated SQ heparin 5000 units 2 hours prior to surgery then every 12 hours).  Plan: Advance diet Encourage ambulation Advance to PO medication  Transfer to regular surgical floor bed Continue foley for at least 2-3 weeks (pending imaging results). Continue JP drain. Granix injections prn for chronic neutropenia Dispo:  Discharge plan to include: eventual discharge to home (anticipate POD 5-6). The patient is to be discharged to home.   LOS: 3 days    [name] 11/22/2014, 9:30 AM

## 2014-11-23 LAB — BASIC METABOLIC PANEL
ANION GAP: 6 (ref 5–15)
BUN: 13 mg/dL (ref 6–23)
CO2: 32 mmol/L (ref 19–32)
Calcium: 8.1 mg/dL — ABNORMAL LOW (ref 8.4–10.5)
Chloride: 100 mmol/L (ref 96–112)
Creatinine, Ser: 0.52 mg/dL (ref 0.50–1.10)
Glucose, Bld: 148 mg/dL — ABNORMAL HIGH (ref 70–99)
POTASSIUM: 3.6 mmol/L (ref 3.5–5.1)
SODIUM: 138 mmol/L (ref 135–145)

## 2014-11-23 LAB — CBC WITH DIFFERENTIAL/PLATELET
BASOS PCT: 1 % (ref 0–1)
Basophils Absolute: 0 10*3/uL (ref 0.0–0.1)
Eosinophils Absolute: 0.2 10*3/uL (ref 0.0–0.7)
Eosinophils Relative: 4 % (ref 0–5)
HEMATOCRIT: 23.6 % — AB (ref 36.0–46.0)
HEMOGLOBIN: 7.5 g/dL — AB (ref 12.0–15.0)
LYMPHS ABS: 1.2 10*3/uL (ref 0.7–4.0)
Lymphocytes Relative: 31 % (ref 12–46)
MCH: 28.2 pg (ref 26.0–34.0)
MCHC: 31.8 g/dL (ref 30.0–36.0)
MCV: 88.7 fL (ref 78.0–100.0)
Monocytes Absolute: 0.5 10*3/uL (ref 0.1–1.0)
Monocytes Relative: 12 % (ref 3–12)
NEUTROS PCT: 52 % (ref 43–77)
Neutro Abs: 2.1 10*3/uL (ref 1.7–7.7)
Platelets: 178 10*3/uL (ref 150–400)
RBC: 2.66 MIL/uL — ABNORMAL LOW (ref 3.87–5.11)
RDW: 16.2 % — ABNORMAL HIGH (ref 11.5–15.5)
WBC: 4 10*3/uL (ref 4.0–10.5)

## 2014-11-23 LAB — TYPE AND SCREEN
ABO/RH(D): A POS
ANTIBODY SCREEN: NEGATIVE
Unit division: 0
Unit division: 0
Unit division: 0
Unit division: 0

## 2014-11-23 LAB — GLUCOSE, CAPILLARY
GLUCOSE-CAPILLARY: 129 mg/dL — AB (ref 70–99)
GLUCOSE-CAPILLARY: 141 mg/dL — AB (ref 70–99)
GLUCOSE-CAPILLARY: 147 mg/dL — AB (ref 70–99)
GLUCOSE-CAPILLARY: 182 mg/dL — AB (ref 70–99)
GLUCOSE-CAPILLARY: 200 mg/dL — AB (ref 70–99)
Glucose-Capillary: 121 mg/dL — ABNORMAL HIGH (ref 70–99)

## 2014-11-23 MED ORDER — CLINDAMYCIN HCL 300 MG PO CAPS
300.0000 mg | ORAL_CAPSULE | Freq: Two times a day (BID) | ORAL | Status: DC
  Administered 2014-11-23 – 2014-11-25 (×5): 300 mg via ORAL
  Filled 2014-11-23 (×6): qty 1

## 2014-11-23 MED ORDER — GLUCERNA SHAKE PO LIQD
237.0000 mL | Freq: Two times a day (BID) | ORAL | Status: DC
  Administered 2014-11-23 – 2014-11-24 (×2): 237 mL via ORAL
  Filled 2014-11-23 (×5): qty 237

## 2014-11-23 MED ORDER — INSULIN ASPART 100 UNIT/ML ~~LOC~~ SOLN
0.0000 [IU] | Freq: Three times a day (TID) | SUBCUTANEOUS | Status: DC
  Administered 2014-11-23: 2 [IU] via SUBCUTANEOUS
  Administered 2014-11-23: 3 [IU] via SUBCUTANEOUS
  Administered 2014-11-24 (×2): 2 [IU] via SUBCUTANEOUS
  Administered 2014-11-24 – 2014-11-25 (×2): 3 [IU] via SUBCUTANEOUS
  Administered 2014-11-25: 2 [IU] via SUBCUTANEOUS

## 2014-11-23 MED ORDER — IBUPROFEN 800 MG PO TABS
800.0000 mg | ORAL_TABLET | Freq: Three times a day (TID) | ORAL | Status: DC
  Administered 2014-11-23 – 2014-11-25 (×6): 800 mg via ORAL
  Filled 2014-11-23 (×10): qty 1

## 2014-11-23 MED ORDER — SIMETHICONE 80 MG PO CHEW
80.0000 mg | CHEWABLE_TABLET | Freq: Four times a day (QID) | ORAL | Status: DC | PRN
  Filled 2014-11-23: qty 1

## 2014-11-23 NOTE — Plan of Care (Signed)
Problem: Phase I Progression Outcomes Goal: Tubes/drains patent Outcome: Completed/Met Date Met:  11/23/14 Foley Catheter; JP Drain; Colostomy

## 2014-11-23 NOTE — Plan of Care (Signed)
Problem: Consults Goal: Diabetes Guidelines if Diabetic/Glucose > 140 If diabetic or lab glucose is > 140 mg/dl - Initiate Diabetes/Hyperglycemia Guidelines & Document Interventions  Outcome: Progressing CBGs q 4hours with SSI coverage

## 2014-11-23 NOTE — Consult Note (Addendum)
WOC ostomy consult note Stoma type/location: LLQ end colostomy Stomal assessment/size: 2 and 1/4 inch oval, edematous, raised, red, moist.  OS at center Peristomal assessment: intact, clear Treatment options for stomal/peristomal skin: skin barrier ring placed circumferentially as well as a 1/2 piece placed from 2-6 o'clock. Output Serous Ostomy pouching: 2pc. 2 and 3/4 inch with 1.5 skin barrier rings Education provided: Extended session for teaching of A&P, stoma characteristics, pouch characteristics. Diet, activity. Patient is much improved over emotional state during visit yesterday (see late entry today for yesterday's visit). Asking appropriate questions. Pouch changed with patient only intermittently looking at stoma.  Medicated for pain within the last hour and fighting the drowsiness that accompanies analgesia. Another ostomy teaching book is left at bedside (patient cannot find the book she was provided initially) and supplies are at bedside in two sizes. [facility] nursing team will follow; we will remain available to this patient, the nursing, surgical and medical teams.   Thanks, Maudie Flakes, MSN, RN, [place], [place], [place] [phone])

## 2014-11-23 NOTE — Consult Note (Signed)
(  Late Entry) Patient seen on 11/22/14 and she was in a tearful, emotional state.  Husband was with her and was assisting her as best as he was able, but was also overwhelmed.  Patient was not comfortable in her bed and did not rest well the night prior, was not pleased with her meals, and could not keep ahead of her pain. Patient was assisted in ordering another meal, bedside RN administered pain medication, an Alternate bed with low air loss feature was requested and delivered and patient was repositioned when the new bed arrived.  A heat pack was provided to the right anterior forearm ecchymotic area. Patient was calming when I left her; she is still tearful and ostomy teaching session is delayed until the following day (11/23/14). [place] nursing team will follow, and will remain available to this patient, the nursing, surgical and medical teams.   Thanks, Maudie Flakes, MSN, RN, [place], [place], [place] [phone]) Extended visit = 55 minutes

## 2014-11-23 NOTE — Progress Notes (Addendum)
Patient ID: [name], female   DOB: April 16, 1956, 59 y.o.   MRN: [id number] 4 Days Post-Op Procedure(s) (LRB): ROBOTIC LYSIS OF ADHESIONS, CONVERTED TO LAPAROTOMY RADICAL UPPER VAGINECTOMY,LOW ANTERIOR BOWEL RESECTION, COLOSTOMY, BILATERAL URETERAL STENT PLACEMENT AND CYSTONOMY CLOSURE (N/A) XI ROBOTIC ASSISTED LOWER ANTERIOR RESECTION (N/A) OSTOMY (N/A)   Subjective: Patient reports "gas" pain/redness skin RUE. Tolerating diet. Ambulating.   Objective: Vital signs in last 24 hours: Temp:  [98.1 F (36.7 C)-99.2 F (37.3 C)] 99 F (37.2 C) (04/23 0440) Pulse Rate:  [81-104] 81 (04/23 0440) Resp:  [16-18] 17 (04/23 0440) BP: (114-152)/(52-77) 127/54 mmHg (04/23 0440) SpO2:  [93 %-100 %] 96 % (04/23 0440) Weight:  [261 lb 11.2 oz (118.706 kg)] 261 lb 11.2 oz (118.706 kg) (04/23 0449) Last BM Date:  (Colostomy with serous drainage)  Intake/Output from previous day: 04/22 0701 - 04/23 0700 In: 600 [P.O.:600] Out: 1656 [Urine:1350; Drains:276; Stool:30]  Physical Examination:  General: alert and cooperative Resp: clear to auscultation bilaterally Cardio: regular rate and rhythm, S1, S2 normal, no murmur, click, rub or gallop GI: soft, non-tender; bowel sounds normal; no masses,  no organomegaly and incision: clean, intact and bloody and small volume drainage present Extremities: extremities normal, atraumatic, no cyanosis or edema Vaginal Bleeding: none  Colostomy: pink, viable, no stool Drain from RLQ (in pelvis): serosanguinous output.   Labs: WBC/Hgb/Hct/Plts:  4.0/7.5/23.6/178 (04/23 0514) BUN/Cr/glu/ALT/AST/amyl/lip:  13/0.52/--/--/--/--/-- (04/23 0514)   BMET    Component Value Date/Time   NA 138 11/23/2014 0514   NA 141 09/12/2014 0857   K 3.6 11/23/2014 0514   K 4.0 09/12/2014 0857   CL 100 11/23/2014 0514   CO2 32 11/23/2014 0514   CO2 28 09/12/2014 0857   GLUCOSE 148* 11/23/2014 0514   GLUCOSE 121 09/12/2014 0857   BUN 13 11/23/2014 0514   BUN 15.4  09/12/2014 0857   CREATININE 0.52 11/23/2014 0514   CREATININE 0.7 09/12/2014 0857   CREATININE 0.84 08/25/2014 1538   CALCIUM 8.1* 11/23/2014 0514   CALCIUM 9.5 09/12/2014 0857   GFRNONAA >90 11/23/2014 0514   GFRNONAA 76 08/25/2014 1538   GFRAA >90 11/23/2014 0514   GFRAA 87 08/25/2014 1538     Assessment:  59 y.o. s/p Procedure(s): ROBOTIC LYSIS OF ADHESIONS, CONVERTED TO LAPAROTOMY RADICAL UPPER VAGINECTOMY,LOW ANTERIOR BOWEL RESECTION, COLOSTOMY, BILATERAL URETERAL STENT PLACEMENT AND CYSTONOMY CLOSURE XI ROBOTIC ASSISTED LOWER ANTERIOR RESECTION OSTOMY: stable Pain:  Pain is controlled on prn medications.  Heme: Anemia: stable  ID: Chronic idiopathic neutropenia. WBC stable.  ?Mild thrombophlebitis at IV site  CV: Hx of HTN.  B/Ps in range  GI:  Tolerating po: yes. Colostomy: appears viable and well perfused, functioning with gas. .  GU: Urine output blood-tinged, adequate  Endo: Diabetes mellitus Type II, under good control.[name] Kitchen   Prophylaxis: pharmacologic prophylaxis (with any of the following: unfractionated SQ heparin 5000 units 2 hours prior to surgery then every 12 hours).  Plan: Clindamycin/Mylicon Warm soaks to RUE Add Glucerna Continue foley for at least 2-3 weeks (pending imaging results). Continue JP drain. Granix injections prn for chronic neutropenia Dispo:  Discharge plan to include: eventual discharge to home (anticipate POD 5-6). The patient is to be discharged to home.   LOS: 4 days    JACKSON-MOORE,Nick Stults A 11/23/2014, 9:46 AM

## 2014-11-23 NOTE — Progress Notes (Signed)
General Surgery Note  LOS: 4 days  POD -  4 Days Post-Op  Assessment/Plan: 1.  ROBOTIC LYSIS OF ADHESIONS, CONVERTED TO LAPAROTOMY, RADICAL UPPER VAGINECTOMY, LOW ANTERIOR BOWEL RESECTION, COLOSTOMY, BILATERAL URETERAL STENT PLACEMENT AND CYSTONOMY CLOSURE, ROBOTIC ASSISTED LOWER ANTERIOR RESECTION - 11/21/2014 - Gross/Rossi  On Entereg  For endometrial/ovarian ca - path pending  Ostomy looks good.  No function.   Still with probable ileus.  2.  History of endometrioid cancer of the ovary. Underwent hysterectomy and salpingo-oophorectomy and possible intraperitoneal chemotherapy in 10/28/2004 by Dr. Rhodia Albright at [facility].  3.  Anemia - Hgb - 7.5 - 11/23/2014  4.  DVT prophylaxis - SQ Heparin 5.  To keep foley in place 6.  Complains of redness to right forearm where an IV was located.  Started on Clindamycin and warm soaks 7.  DM    Active Problems:   History of ovarian cancer   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 s/p resection/partial vaginectomy/ LAR/colostomy 11/19/2014   Acute respiratory failure with hypoxia   Endotracheally intubated   Morbid obesity  Subjective:  Doing okay.  Her husband is in the room. Objective:   Filed Vitals:   11/23/14 1035  BP: 148/79  Pulse: 94  Temp: 98 F (36.7 C)  Resp: 17     Intake/Output from previous day:  04/22 0701 - 04/23 0700 In: 600 [P.O.:600] Out: 1656 [Urine:1350; Drains:276; Stool:30]  Intake/Output this shift:  Total I/O In: -  Out: 380 [Urine:300; Drains:80]   Physical Exam:   General: Obese WF who is alert and oriented.    HEENT: Normal. Pupils equal. .   Lungs: Clear.   Abdomen: Soft, but quiet.   Wound: Ostomy LLQ pink, no function   Lab Results:    Recent Labs  11/22/14 0435 11/23/14 0514  WBC 4.9 4.0  HGB 7.4* 7.5*  HCT 23.5* 23.6*  PLT 137* 178    BMET   Recent Labs  11/22/14 0435 11/23/14 0514  NA 137 138  K 3.3* 3.6  CL 100 100  CO2 31 32  GLUCOSE 115* 148*  BUN  12 13  CREATININE 0.50 0.52  CALCIUM 8.0* 8.1*    PT/INR  No results for input(s): LABPROT, INR in the last 72 hours.  ABG  No results for input(s): PHART, HCO3 in the last 72 hours.  Invalid input(s): PCO2, PO2   Studies/Results:  No results found.   Anti-infectives:   Anti-infectives    Start     Dose/Rate Route Frequency Ordered Stop   11/23/14 1200  clindamycin (CLEOCIN) capsule 300 mg     300 mg Oral 2 times daily 11/23/14 1003     11/19/14 2200  clindamycin (CLEOCIN) IVPB 900 mg     900 mg 100 mL/hr over 30 Minutes Intravenous 3 times per day 11/19/14 1902 11/19/14 2217   11/18/14 1600  clindamycin (CLEOCIN) 900 mg, gentamicin (GARAMYCIN) 240 mg in sodium chloride 0.9 % 1,000 mL for intraperitoneal lavage    Comments:  Pharmacy may adjust dosing strength, schedule, rate of infusion, etc as needed to optimize therapy    Intraperitoneal To Surgery 11/18/14 1559 11/19/14 1015   11/18/14 1559  clindamycin (CLEOCIN) IVPB 900 mg  Status:  Discontinued     900 mg 100 mL/hr over 30 Minutes Intravenous 60 min pre-op 11/18/14 1559 11/20/14 0733   11/18/14 1559  gentamicin (GARAMYCIN) 560 mg in dextrose 5 % 100 mL IVPB     5 mg/kg  111.6  kg 114 mL/hr over 60 Minutes Intravenous 60 min pre-op 11/18/14 1559 11/19/14 1030      Alphonsa Overall, MD, FACS Pager: [facility] Office: [phone] 11/23/2014

## 2014-11-24 LAB — CBC WITH DIFFERENTIAL/PLATELET
BASOS ABS: 0 10*3/uL (ref 0.0–0.1)
BASOS PCT: 1 % (ref 0–1)
Eosinophils Absolute: 0.2 10*3/uL (ref 0.0–0.7)
Eosinophils Relative: 5 % (ref 0–5)
HCT: 21.5 % — ABNORMAL LOW (ref 36.0–46.0)
HEMOGLOBIN: 7 g/dL — AB (ref 12.0–15.0)
LYMPHS ABS: 1.2 10*3/uL (ref 0.7–4.0)
Lymphocytes Relative: 30 % (ref 12–46)
MCH: 28.8 pg (ref 26.0–34.0)
MCHC: 32.6 g/dL (ref 30.0–36.0)
MCV: 88.5 fL (ref 78.0–100.0)
MONO ABS: 0.4 10*3/uL (ref 0.1–1.0)
Monocytes Relative: 10 % (ref 3–12)
Neutro Abs: 2.2 10*3/uL (ref 1.7–7.7)
Neutrophils Relative %: 54 % (ref 43–77)
Platelets: 218 10*3/uL (ref 150–400)
RBC: 2.43 MIL/uL — AB (ref 3.87–5.11)
RDW: 16.3 % — ABNORMAL HIGH (ref 11.5–15.5)
WBC: 4.1 10*3/uL (ref 4.0–10.5)

## 2014-11-24 LAB — BASIC METABOLIC PANEL
Anion gap: 7 (ref 5–15)
BUN: 19 mg/dL (ref 6–23)
CALCIUM: 8.4 mg/dL (ref 8.4–10.5)
CHLORIDE: 102 mmol/L (ref 96–112)
CO2: 30 mmol/L (ref 19–32)
CREATININE: 0.75 mg/dL (ref 0.50–1.10)
Glucose, Bld: 137 mg/dL — ABNORMAL HIGH (ref 70–99)
Potassium: 3.9 mmol/L (ref 3.5–5.1)
Sodium: 139 mmol/L (ref 135–145)

## 2014-11-24 LAB — GLUCOSE, CAPILLARY
GLUCOSE-CAPILLARY: 175 mg/dL — AB (ref 70–99)
Glucose-Capillary: 136 mg/dL — ABNORMAL HIGH (ref 70–99)
Glucose-Capillary: 130 mg/dL — ABNORMAL HIGH (ref 70–99)
Glucose-Capillary: 175 mg/dL — ABNORMAL HIGH (ref 70–99)

## 2014-11-24 NOTE — Plan of Care (Signed)
Problem: Phase II Progression Outcomes Goal: Return of bowel function (flatus, BM) IF ABDOMINAL SURGERY:  Outcome: Not Applicable Date Met:  33/74/45 colostomy

## 2014-11-24 NOTE — Progress Notes (Signed)
Patient ID: [name], female   DOB: Sep 13, 1955, 59 y.o.   MRN: [id number] 5 Days Post-Op Procedure(s) (LRB): ROBOTIC LYSIS OF ADHESIONS, CONVERTED TO LAPAROTOMY RADICAL UPPER VAGINECTOMY,LOW ANTERIOR BOWEL RESECTION, COLOSTOMY, BILATERAL URETERAL STENT PLACEMENT AND CYSTONOMY CLOSURE (N/A) XI ROBOTIC ASSISTED LOWER ANTERIOR RESECTION (N/A) OSTOMY (N/A)   Subjective: Tolerating diet. Ambulating.   Objective: Vital signs in last 24 hours: Temp:  [98 F (36.7 C)-98.2 F (36.8 C)] 98.2 F (36.8 C) (04/24 0600) Pulse Rate:  [81-94] 87 (04/24 0600) Resp:  [16-17] 16 (04/24 0600) BP: (127-148)/(46-79) 130/46 mmHg (04/24 0600) SpO2:  [96 %-99 %] 96 % (04/24 0600) Weight:  [265 lb 4.8 oz (120.339 kg)] 265 lb 4.8 oz (120.339 kg) (04/24 0332) Last BM Date: 11/23/14  Intake/Output from previous day: 04/23 0701 - 04/24 0700 In: 720 [P.O.:720] Out: 1555 [Urine:1300; Drains:255]  Physical Examination:  General: alert and cooperative Resp: clear to auscultation bilaterally Cardio: regular rate and rhythm, S1, S2 normal, no murmur, click, rub or gallop GI: soft, non-tender; bowel sounds normal; no masses,  no organomegaly and incision: clean, intact and bloody and small volume drainage present Extremities: extremities normal, atraumatic, no cyanosis or edema Vaginal Bleeding: none  Colostomy: pink, viable, stool output noted Drain from RLQ (in pelvis): serosanguinous output.   Labs: WBC/Hgb/Hct/Plts:  4.1/7.0/21.5/218 (04/24 0546) BUN/Cr/glu/ALT/AST/amyl/lip:  19/0.75/--/--/--/--/-- (04/24 0546)   BMET    Component Value Date/Time   NA 139 11/24/2014 0546   NA 141 09/12/2014 0857   K 3.9 11/24/2014 0546   K 4.0 09/12/2014 0857   CL 102 11/24/2014 0546   CO2 30 11/24/2014 0546   CO2 28 09/12/2014 0857   GLUCOSE 137* 11/24/2014 0546   GLUCOSE 121 09/12/2014 0857   BUN 19 11/24/2014 0546   BUN 15.4 09/12/2014 0857   CREATININE 0.75 11/24/2014 0546   CREATININE 0.7 09/12/2014  0857   CREATININE 0.84 08/25/2014 1538   CALCIUM 8.4 11/24/2014 0546   CALCIUM 9.5 09/12/2014 0857   GFRNONAA >90 11/24/2014 0546   GFRNONAA 76 08/25/2014 1538   GFRAA >90 11/24/2014 0546   GFRAA 87 08/25/2014 1538     Assessment:  59 y.o. s/p Procedure(s): ROBOTIC LYSIS OF ADHESIONS, CONVERTED TO LAPAROTOMY RADICAL UPPER VAGINECTOMY,LOW ANTERIOR BOWEL RESECTION, COLOSTOMY, BILATERAL URETERAL STENT PLACEMENT AND CYSTONOMY CLOSURE XI ROBOTIC ASSISTED LOWER ANTERIOR RESECTION OSTOMY: stable Pain:  Pain is controlled on prn medications.  Heme: Anemia: stable--minimal downward trend.  Minimal symptoms  ID: Chronic idiopathic neutropenia. WBC stable.  Mild thrombophlebitis at IV site--stable  CV: Hx of HTN.  B/Ps in range  GI:  Tolerating po: yes. Colostomy: appears viable and well perfused, functioning with stool.   GU: Urine output blood-tinged, adequate  Endo: Diabetes mellitus Type II, under good control.[name] Kitchen   Prophylaxis: pharmacologic prophylaxis (with any of the following: unfractionated SQ heparin 5000 units 2 hours prior to surgery then every 12 hours).  Plan:   Leg bag teaching Creatinine on JP drainage Dispo:  Discharge plan to include: anticipate discharge to home on 4/25    LOS: 5 days    JACKSON-MOORE,Donalda Job A 11/24/2014, 10:30 AM

## 2014-11-24 NOTE — Progress Notes (Signed)
Pt provided with leg bag. It's application & use discussed with pt. Reann Dobias, [employer]

## 2014-11-24 NOTE — Plan of Care (Signed)
Problem: Phase II Progression Outcomes Goal: Foley discontinued Outcome: Not Applicable Date Met:  97/84/78 Foley to remain!!!!

## 2014-11-24 NOTE — Progress Notes (Signed)
General Surgery Note  LOS: 5 days  POD -  5 Days Post-Op  Assessment/Plan: 1.  ROBOTIC LYSIS OF ADHESIONS, CONVERTED TO LAPAROTOMY, RADICAL UPPER VAGINECTOMY, LOW ANTERIOR BOWEL RESECTION, COLOSTOMY, BILATERAL URETERAL STENT PLACEMENT AND CYSTONOMY CLOSURE, ROBOTIC ASSISTED LOWER ANTERIOR RESECTION - 11/21/2014 - Gross/Rossi  On Entereg  For endometrial/ovarian ca - Path report - 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 (I did not discuss her path with her)  Ostomy looks good.  Full of loose stool.  2.  History of endometrioid cancer of the ovary. Underwent hysterectomy and salpingo-oophorectomy and possible intraperitoneal chemotherapy in 10/28/2004 by Dr. Rhodia Albright at [facility].  3.  Anemia - Hgb - 7.0 - 11/24/2014  4.  DVT prophylaxis - SQ Heparin 5.  To keep foley in place 6.  Complains of redness to right forearm where an IV was located.  This appears better today.  On Clindamycin  7.  DM    Active Problems:   History of ovarian cancer   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 s/p resection/partial vaginectomy/ LAR/colostomy 11/19/2014   Acute respiratory failure with hypoxia   Endotracheally intubated   Morbid obesity  Subjective:  Doing okay.  Looks good.  Her husband is in the room.  Dr. Delsa Sale in room. Objective:   Filed Vitals:   11/24/14 0600  BP: 130/46  Pulse: 87  Temp: 98.2 F (36.8 C)  Resp: 16     Intake/Output from previous day:  04/23 0701 - 04/24 0700 In: 720 [P.O.:720] Out: [id number] [Urine:1300; Drains:255]  Intake/Output this shift:  Total I/O In: 240 [P.O.:240] Out: 290 [Urine:200; Drains:90]   Physical Exam:   General: Obese WF who is alert and oriented.    HEENT: Normal. Pupils equal. .   Lungs: Clear.   Abdomen: Soft.  Has BS.   Wound: Ostomy - with stool.  Drain - 255 cc recorded yesterday.   Lab Results:     Recent Labs  11/23/14 0514  11/24/14 0546  WBC 4.0 4.1  HGB 7.5* 7.0*  HCT 23.6* 21.5*  PLT 178 218    BMET    Recent Labs  11/23/14 0514 11/24/14 0546  NA 138 139  K 3.6 3.9  CL 100 102  CO2 32 30  GLUCOSE 148* 137*  BUN 13 19  CREATININE 0.52 0.75  CALCIUM 8.1* 8.4    PT/INR  No results for input(s): LABPROT, INR in the last 72 hours.  ABG  No results for input(s): PHART, HCO3 in the last 72 hours.  Invalid input(s): PCO2, PO2   Studies/Results:  No results found.   Anti-infectives:   Anti-infectives    Start     Dose/Rate Route Frequency Ordered Stop   11/23/14 1200  clindamycin (CLEOCIN) capsule 300 mg     300 mg Oral 2 times daily 11/23/14 1003     11/19/14 2200  clindamycin (CLEOCIN) IVPB 900 mg     900 mg 100 mL/hr over 30 Minutes Intravenous 3 times per day 11/19/14 1902 11/19/14 2217   11/18/14 1600  clindamycin (CLEOCIN) 900 mg, gentamicin (GARAMYCIN) 240 mg in sodium chloride 0.9 % 1,000 mL for intraperitoneal lavage    Comments:  Pharmacy may adjust dosing strength, schedule, rate of infusion, etc as needed to optimize therapy    Intraperitoneal To Surgery 11/18/14 1559 11/19/14 1015   11/18/14 1559  clindamycin (CLEOCIN) IVPB 900 mg  Status:  Discontinued     900 mg 100  mL/hr over 30 Minutes Intravenous 60 min pre-op 11/18/14 1559 11/20/14 0733   11/18/14 1559  gentamicin (GARAMYCIN) 560 mg in dextrose 5 % 100 mL IVPB     5 mg/kg  111.6 kg 114 mL/hr over 60 Minutes Intravenous 60 min pre-op 11/18/14 1559 11/19/14 1030      Alphonsa Overall, MD, FACS Pager: [facility] Office: [phone] 11/24/2014

## 2014-11-24 NOTE — Plan of Care (Signed)
Problem: Consults Goal: Diabetes Guidelines if Diabetic/Glucose > 140 If diabetic or lab glucose is > 140 mg/dl - Initiate Diabetes/Hyperglycemia Guidelines & Document Interventions  Outcome: Progressing Tolerating Meals 11/24/14.  CBG AC/HS. Restarted Metformin 11/24/14

## 2014-11-25 LAB — CBC WITH DIFFERENTIAL/PLATELET
Basophils Absolute: 0 10*3/uL (ref 0.0–0.1)
Basophils Relative: 1 % (ref 0–1)
Eosinophils Absolute: 0.3 10*3/uL (ref 0.0–0.7)
Eosinophils Relative: 8 % — ABNORMAL HIGH (ref 0–5)
HCT: 21.7 % — ABNORMAL LOW (ref 36.0–46.0)
HEMOGLOBIN: 6.7 g/dL — AB (ref 12.0–15.0)
LYMPHS ABS: 1.4 10*3/uL (ref 0.7–4.0)
LYMPHS PCT: 34 % (ref 12–46)
MCH: 27.3 pg (ref 26.0–34.0)
MCHC: 30.9 g/dL (ref 30.0–36.0)
MCV: 88.6 fL (ref 78.0–100.0)
MONOS PCT: 14 % — AB (ref 3–12)
Monocytes Absolute: 0.5 10*3/uL (ref 0.1–1.0)
NEUTROS ABS: 1.7 10*3/uL (ref 1.7–7.7)
Neutrophils Relative %: 43 % (ref 43–77)
PLATELETS: 279 10*3/uL (ref 150–400)
RBC: 2.45 MIL/uL — ABNORMAL LOW (ref 3.87–5.11)
RDW: 17.1 % — ABNORMAL HIGH (ref 11.5–15.5)
WBC: 4 10*3/uL (ref 4.0–10.5)

## 2014-11-25 LAB — CREATININE, FLUID (PLEURAL, PERITONEAL, JP DRAINAGE): Creat, Fluid: 0.8 mg/dL

## 2014-11-25 LAB — GLUCOSE, CAPILLARY
Glucose-Capillary: 143 mg/dL — ABNORMAL HIGH (ref 70–99)
Glucose-Capillary: 177 mg/dL — ABNORMAL HIGH (ref 70–99)

## 2014-11-25 MED ORDER — CLINDAMYCIN HCL 300 MG PO CAPS: 300.0000 mg | ORAL_CAPSULE | Freq: Two times a day (BID) | ORAL | Status: DC

## 2014-11-25 MED ORDER — SODIUM CHLORIDE 0.9 % IV SOLN: Freq: Once | INTRAVENOUS | Status: DC

## 2014-11-25 MED ORDER — ENOXAPARIN (LOVENOX) PATIENT EDUCATION KIT
PACK | Freq: Once | Status: AC
  Administered 2014-11-25: 11:00:00
  Filled 2014-11-25: qty 1

## 2014-11-25 MED ORDER — ONDANSETRON HCL 4 MG PO TABS: 4.0000 mg | ORAL_TABLET | Freq: Four times a day (QID) | ORAL | Status: DC | PRN

## 2014-11-25 MED ORDER — ACETAMINOPHEN 500 MG PO TABS: 1000.0000 mg | ORAL_TABLET | Freq: Three times a day (TID) | ORAL | Status: DC

## 2014-11-25 MED ORDER — FERROUS SULFATE 325 (65 FE) MG PO TABS: 325.0000 mg | ORAL_TABLET | Freq: Two times a day (BID) | ORAL | Status: DC

## 2014-11-25 MED ORDER — IBUPROFEN 800 MG PO TABS: 800.0000 mg | ORAL_TABLET | Freq: Three times a day (TID) | ORAL | Status: DC

## 2014-11-25 MED ORDER — ENOXAPARIN SODIUM 40 MG/0.4ML ~~LOC~~ SOLN: 40.0000 mg | SUBCUTANEOUS | Status: DC

## 2014-11-25 MED ORDER — ADULT MULTIVITAMIN W/MINERALS CH: 1.0000 | ORAL_TABLET | Freq: Every day | ORAL | Status: DC

## 2014-11-25 MED ORDER — OXYCODONE HCL 5 MG PO TABS: 5.0000 mg | ORAL_TABLET | ORAL | Status: DC | PRN

## 2014-11-25 NOTE — Consult Note (Signed)
WOC ostomy follow up Stoma type/location: LLQ colostomy Stomal assessment/size: 2 and 1/4 inches mostly round Peristomal assessment: intact, clear with slight depression from 8-10 o'clock Treatment options for stomal/peristomal skin: skin barrier rings (1.5) Output brown stool Ostomy pouching: 2pc. 2 and 3/4 inch pouching system with skin barrier rings Education provided: Extended session with daughter and husband present.  Pouch removal, preparation and application, also reinforcement of GI A&P, pouch characteristics, stoma characteristics, diet, resumption of ADLs. Questions answered regarding supply and [employer]. Enrolled patient in [place] [facility] Discharge program: Yes Ready for discharge from an ostomy perspective to the care to her family with support from [facility]. Discharge supplies provided. [place] nursing team will not follow, but will remain available to this patient, the nursing and medical team.  Please re-consult if needed. Thanks, Maudie Flakes, MSN, RN, [place], [place], [place] [phone])

## 2014-11-25 NOTE — Discharge Summary (Signed)
Physician Discharge Summary  Patient ID: [name] MRN: [id number] DOB/AGE: 11/22/1955 59 y.o.  Admit date: 11/19/2014 Discharge date: 11/25/2014  Admission Diagnoses: <principal problem not specified>  Discharge Diagnoses:  Active Problems:   History of ovarian cancer   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 s/p resection/partial vaginectomy/ LAR/colostomy 11/19/2014   Acute respiratory failure with hypoxia   Endotracheally intubated   Morbid obesity   Discharged Condition: good  Hospital Course: The patient was admitted on 11/19/14 for a robotic radical upper vaginectomy, low anterior resection and end colostomy for recurrent endometrioid endometrial and ovarian cancer. The surgery was complicated by poor visualization secondary to adhesive disease and pelvic tumor extending towards the side wall. Therefore her robotic procedure was converted to a laparotomy. The procedure was otherwise complicated by close proximity of the tumor to the right ureter and bladder base/trigone, and a cystotomy in close proximity to the ureteral orifices was necessary in order to resect the tumor with negative margins. Bilateral ureteral stents were placed intraoperatively and the bladder was oversewn with an indwelling catheter in the bladder and a drain in the pelvis. An end colostomy was fashioned. The recurrent endometrial cancer (at the right posterior vaginal cuff) was resected en bloc with attached proximal vagina and the rectum and distal sigmoid colon attached, and margins were negative on final patholgy.  Postoperatively she was kept intubated overnight on POD0 secondary to concern for postoperative volume shifts, a long surgical procedure, and high EBL (1200cc). She received 2 units of PRBC intraoperatively for acute blood loss anemia. Postoperatively she did well and was extubated on POD 1. On POD 2 she was transferred to the surgical floor. She developed a mild cellulitis/thrombophlebitis  of an IV site, but no surgical site infections. This thrombophelbitis was treated with clindamycin.  Her diet was slowly advanced and she had no postop nausea or emesis. Flatus began on POD 2, and stool was present on POD 4. She has received teaching with wound and ostomy nurses postop.  Her foley has been draining blood tinged urine, and JP creatinine on day of discharge was 0.8 (serum 0.76).  She has had postop blood loss anemia but has been asymptomatic with ambulation, and has normal vital signs. Her Hb on day of discharge is 6.7 g/dL. She has been prescribed iron replacement therapy to treat this.  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 She has had an appropriate count postop after preoperative injections. She will see Dr Alvy Bimler 4 days post discharge to continue to monitor this. As stated above, she has had no major signs of postoperative infections other than IV thrombophlebitis which is limited and mild.  Consults: hematology/oncology and general surgery  Significant Diagnostic Studies: labs:  CBC    Component Value Date/Time   WBC 4.0 11/25/2014 0500   WBC 11.0* 11/18/2014 0821   WBC 2.4* 08/25/2014 1541   RBC 2.45* 11/25/2014 0500   RBC 4.70 11/18/2014 0821   RBC 4.75 08/25/2014 1541   HGB 6.7* 11/25/2014 0500   HGB 13.1 11/18/2014 0821   HGB 13.0 08/25/2014 1541   HCT 21.7* 11/25/2014 0500   HCT 41.5 11/18/2014 0821   HCT 41.3 08/25/2014 1541   PLT 279 11/25/2014 0500   PLT 286 11/18/2014 0821   MCV 88.6 11/25/2014 0500   MCV 88.3 11/18/2014 0821   MCV 86.8 08/25/2014 1541   MCH 27.3 11/25/2014 0500   MCH 27.9 11/18/2014 [id number]  MCH 27.5 08/25/2014 1541   MCHC 30.9 11/25/2014 0500   MCHC 31.6 11/18/2014 0821   MCHC 31.6* 08/25/2014 1541   RDW 17.1* 11/25/2014 0500   RDW 16.3* 11/18/2014 0821   LYMPHSABS 1.4 11/25/2014 0500   LYMPHSABS 2.5 11/18/2014 0821    MONOABS 0.5 11/25/2014 0500   MONOABS 1.0* 11/18/2014 0821   EOSABS 0.3 11/25/2014 0500   EOSABS 0.2 11/18/2014 0821   BASOSABS 0.0 11/25/2014 0500   BASOSABS 0.1 11/18/2014 0821    BMET    Component Value Date/Time   NA 139 11/24/2014 0546   NA 141 09/12/2014 0857   K 3.9 11/24/2014 0546   K 4.0 09/12/2014 0857   CL 102 11/24/2014 0546   CO2 30 11/24/2014 0546   CO2 28 09/12/2014 0857   GLUCOSE 137* 11/24/2014 0546   GLUCOSE 121 09/12/2014 0857   BUN 19 11/24/2014 0546   BUN 15.4 09/12/2014 0857   CREATININE 0.75 11/24/2014 0546   CREATININE 0.7 09/12/2014 0857   CREATININE 0.84 08/25/2014 1538   CALCIUM 8.4 11/24/2014 0546   CALCIUM 9.5 09/12/2014 0857   GFRNONAA >90 11/24/2014 0546   GFRNONAA 76 08/25/2014 1538   GFRAA >90 11/24/2014 0546   GFRAA 87 08/25/2014 1538      Treatments: surgery: see above  Discharge Exam: Blood pressure 155/70, pulse 87, temperature 98.2 F (36.8 C), temperature source Oral, resp. rate 16, height 5\' 2"  (1.575 m), weight 265 lb 4.8 oz (120.339 kg), SpO2 99 %. General appearance: alert and cooperative Resp: clear to auscultation bilaterally Cardio: regular rate and rhythm, S1, S2 normal, no murmur, click, rub or gallop GI: soft, non-tender; bowel sounds normal; no masses,  no organomegaly Incision/Wound: clean, slight drainage, no erythema, staples in situ. JP drain: serosanguinous Foley: blood tinged, draining  Disposition: 01-Home or Self Care  Discharge Instructions    (HEART FAILURE PATIENTS) Call MD:  Anytime you have any of the following symptoms: 1) 3 pound weight gain in 24 hours or 5 pounds in 1 week 2) shortness of breath, with or without a dry hacking cough 3) swelling in the hands, feet or stomach 4) if you have to sleep on extra pillows at night in order to breathe.    Complete by:  As directed      Call MD for:  difficulty breathing, headache or visual disturbances    Complete by:  As directed      Call MD for:   extreme fatigue    Complete by:  As directed      Call MD for:  extreme fatigue    Complete by:  As directed      Call MD for:  hives    Complete by:  As directed      Call MD for:  hives    Complete by:  As directed      Call MD for:  persistant dizziness or light-headedness    Complete by:  As directed      Call MD for:  persistant nausea and vomiting    Complete by:  As directed      Call MD for:  persistant nausea and vomiting    Complete by:  As directed      Call MD for:  redness, tenderness, or signs of infection (pain, swelling, redness, odor or green/yellow discharge around incision site)    Complete by:  As directed      Call MD for:  redness, tenderness, or signs of infection (pain, swelling,  redness, odor or green/yellow discharge around incision site)    Complete by:  As directed      Call MD for:  severe uncontrolled pain    Complete by:  As directed      Call MD for:  severe uncontrolled pain    Complete by:  As directed      Call MD for:  temperature >100.4    Complete by:  As directed      Call MD for:    Complete by:  As directed   Temperature > 101.40F     Diet - low sodium heart healthy    Complete by:  As directed      Diet - low sodium heart healthy    Complete by:  As directed      Diet general    Complete by:  As directed      Discharge instructions    Complete by:  As directed   Please see discharge instruction sheets.  Also refer to handout given an office.  Please call our office if you have any questions or concerns [phone]     Discharge wound care:    Complete by:  As directed   If you have closed incisions, shower and bathe over these incisions with soap and water every day.  Remove all surgical dressings on postoperative day #3.  You do not need to replace dressings over the closed incisions unless you feel more comfortable with a Band-Aid covering it.   If you have an open wound that requires packing, please see wound care instructions.  In  general, remove all dressings, wash wound with soap and water and then replace with saline moistened gauze.  Do the dressing change at least every day.  Please call our office [phone] if you have further questions.     Driving Restrictions    Complete by:  As directed   No driving until off narcotics and can safely swerve away without pain during an emergency     Driving Restrictions    Complete by:  As directed   No driving for 7 days or until off narcotic pain medication     Increase activity slowly    Complete by:  As directed   Walk an hour a day.  Use 20-30 minute walks.  When you can walk 30 minutes without difficulty, increase to low impact/moderate activities such as biking, jogging, swimming, sexual activity..  Eventually can increase to unrestricted activity when not feeling pain.  If you feel pain: STOP![name] Kitchen   Let pain protect you from overdoing it.  Use ice/heat/over-the-counter pain medications to help minimize his soreness.  Use pain prescriptions as needed to remain active.  It is better to take extra pain medications and be more active than to stay bedridden to avoid all pain medications.     Increase activity slowly    Complete by:  As directed      Lifting restrictions    Complete by:  As directed   Avoid heavy lifting initially.  Do not push through pain.  You have no specific weight limit.  Coughing and sneezing or four more stressful to your incision than any lifting you will do. Pain will protect you from injury.  Therefore, avoid intense activity until off all narcotic pain medications.  Coughing and sneezing or four more stressful to your incision than any lifting he will do.     May shower / Bathe    Complete by:  As directed      May walk up steps    Complete by:  As directed      Remove dressing in 24 hours    Complete by:  As directed      Sexual Activity Restrictions    Complete by:  As directed   Sexual activity as tolerated.  Do not push through pain.  Pain  will protect you from injury.     Sexual Activity Restrictions    Complete by:  As directed   No intercourse for 6 weeks     Walk with assistance    Complete by:  As directed   Walk over an hour a day.  May use a walker/cane/companion to help with balance and stamina.            Medication List    STOP taking these medications        bisacodyl 5 MG EC tablet  Commonly known as:  DULCOLAX     metroNIDAZOLE 500 MG tablet  Commonly known as:  FLAGYL     neomycin 500 MG tablet  Commonly known as:  MYCIFRADIN     polyethylene glycol packet  Commonly known as:  MIRALAX / GLYCOLAX      TAKE these medications        acetaminophen 500 MG tablet  Commonly known as:  TYLENOL  Take 2 tablets (1,000 mg total) by mouth 3 (three) times daily.     albuterol 108 (90 BASE) MCG/ACT inhaler  Commonly known as:  PROVENTIL HFA;VENTOLIN HFA  Inhale 2 puffs into the lungs every 6 (six) hours as needed for wheezing or shortness of breath.     ALEVE 220 MG tablet  Generic drug:  naproxen sodium  Take 220 mg by mouth every 12 (twelve) hours.     CALCIUM + D PO  Take 2 tablets by mouth daily.     clindamycin 300 MG capsule  Commonly known as:  CLEOCIN  Take 1 capsule (300 mg total) by mouth 2 (two) times daily.     docusate sodium 100 MG capsule  Commonly known as:  COLACE  Take 100 mg by mouth daily.     enoxaparin 40 MG/0.4ML injection  Commonly known as:  LOVENOX  Inject 0.4 mLs (40 mg total) into the skin daily.     ferrous sulfate 325 (65 FE) MG tablet  Take 1 tablet (325 mg total) by mouth 2 (two) times daily with a meal.     fluconazole 100 MG tablet  Commonly known as:  DIFLUCAN  Take 1 tablet (100 mg total) by mouth once. May repeat prn     hydrochlorothiazide 12.5 MG tablet  Commonly known as:  HYDRODIURIL  Take 12.5 mg by mouth every morning.     INVOKANA 300 MG Tabs tablet  Generic drug:  canagliflozin  Take 1 tablet by mouth every morning.     metFORMIN 1000  MG tablet  Commonly known as:  GLUCOPHAGE  Take 1,000 mg by mouth 2 (two) times daily with a meal.     multivitamin with minerals Tabs tablet  Take 1 tablet by mouth daily.     multivitamin with minerals Tabs tablet  Take 1 tablet by mouth daily.     omega-3 acid ethyl esters 1 G capsule  Commonly known as:  LOVAZA  Take 1 g by mouth 2 (two) times daily.     omeprazole 20 MG capsule  Commonly known as:  PRILOSEC  Take 20  mg by mouth daily.     ondansetron 4 MG tablet  Commonly known as:  ZOFRAN  Take 1 tablet (4 mg total) by mouth every 6 (six) hours as needed for nausea.     ONETOUCH VERIO test strip  Generic drug:  glucose blood     oxyCODONE 5 MG immediate release tablet  Commonly known as:  Oxy IR/ROXICODONE  Take 1-2 tablets (5-10 mg total) by mouth every 4 (four) hours as needed for moderate pain, severe pain or breakthrough pain.     oxyCODONE-acetaminophen 5-325 MG per tablet  Commonly known as:  PERCOCET/ROXICET  Take 1-2 tablets by mouth every 4 (four) hours as needed for severe pain.     rosuvastatin 10 MG tablet  Commonly known as:  CRESTOR  Take 10 mg by mouth every evening.     sitaGLIPtin 100 MG tablet  Commonly known as:  JANUVIA  Take 100 mg by mouth daily.     SYNTHROID 175 MCG tablet  Generic drug:  levothyroxine  Take 175 mcg by mouth daily before breakfast.     SYSTANE OP  Apply 1 drop to eye daily as needed (Dry eyes).     Tbo-Filgrastim 480 MCG/0.8ML Sosy injection  Commonly known as:  GRANIX  Inject 480 mcg into the skin once a week.     traMADol 50 MG tablet  Commonly known as:  ULTRAM  Take 1-2 tablets (50-100 mg total) by mouth every 6 (six) hours as needed.     valsartan 320 MG tablet  Commonly known as:  DIOVAN  Take 320 mg by mouth every morning.     Vitamin D3 10000 UNITS capsule  Take 10,000 Units by mouth once a week. Sundays           Follow-up Information    Follow up with GROSS,STEVEN C., MD In 2 weeks.    Specialty:  General Surgery   Why:  To follow up after your operation, To follow up after your hospital stay   Contact information:   [place] [address] [phone]       Follow up with Donaciano Eva, MD In 4 days.   Specialty:  Obstetrics and Gynecology   Why:  To follow up after your operation, To follow up after your hospital stay, To have your drain removed & incisions re-checked   Contact information:   [address] [place] [address] [phone]       Follow up with [facility], NI, MD In 4 days.   Specialty:  Hematology and Oncology   Contact information:   [place] [address] [phone]       Signed: Donaciano Eva 11/25/2014, 12:46 PM

## 2014-11-25 NOTE — Progress Notes (Signed)
[name]   DOB:06/15/1956   [id number]   [id number]  Patient Care Team: Leana Gamer, MD as PCP - General (Internal Medicine) [name], MD as Referring Physician (Obstetrics and Gynecology) Heath Lark, MD as Consulting Physician (Hematology and Oncology) Everitt Amber, MD as Consulting Physician (Obstetrics and Gynecology) Michael [name], MD as Consulting Physician (General Surgery)  I have seen the patient, examined her and edited the notes as follows  Subjective: Patient seen and examined. She recovering well. She has intermittent subjective fevers.  Denies chills, night sweats, vision changes, or mucositis. Denies any respiratory complaints. Denies any chest pain or palpitations. Denies lower extremity swelling. Denies nausea, heartburn. Incisional pain is present, better controlled with meds. Colostomy pouch functioning well without loose stools. Appetite is normal. She has a urinary catheter in place. Denies abnormal skin rashes, or neuropathy. Denies any bleeding issues such as epistaxis, hematemesis. She has some serosanguineous fluid from the JP drain. Ambulating minimally due to recent surgery.  Scheduled Meds: . acetaminophen  1,000 mg Oral TID  . antiseptic oral rinse  7 mL Mouth Rinse QID  . chlorhexidine  15 mL Mouth Rinse BID  . clindamycin  300 mg Oral BID  . feeding supplement (GLUCERNA SHAKE)  237 mL Oral BID BM  . heparin subcutaneous  5,000 Units Subcutaneous 3 times per day  . ibuprofen  800 mg Oral 3 times per day  . insulin aspart  0-15 Units Subcutaneous TID WC  . levothyroxine  175 mcg Oral QAC breakfast  . lip balm  1 application Topical BID  . metFORMIN  1,000 mg Oral BID WC  . multivitamin with minerals  1 tablet Oral Daily  . pantoprazole  40 mg Oral Q1200  . rosuvastatin  10 mg Oral QPM  . saccharomyces boulardii  250 mg Oral BID  . sodium chloride  3 mL Intravenous Q12H   Continuous Infusions:   PRN Meds:sodium chloride, albuterol, alum &  mag hydroxide-simeth, HYDROmorphone (DILAUDID) injection, magic mouthwash, menthol-cetylpyridinium, methocarbamol (ROBAXIN)  IV, ondansetron **OR** [DISCONTINUED] ondansetron (ZOFRAN) IV, oxyCODONE, phenol, potassium chloride, simethicone, sodium chloride, Tbo-filgastrim (GRANIX) SQ   Objective:  Filed Vitals:   11/25/14 0505  BP: 155/70  Pulse: 87  Temp: 98.2 F (36.8 C)  Resp: 16      Intake/Output Summary (Last 24 hours) at 11/25/14 0754 Last data filed at 11/25/14 0510  Gross per 24 hour  Intake   1160 ml  Output   1765 ml  Net   -605 ml    ECOG PERFORMANCE STATUS: 1  GENERAL:alert, no distress and comfortable. She looks pale SKIN: skin color, texture, turgor are normal, no rashes or significant lesions except for bandaged surgical site. Extensive bruises are noted EYES: normal, conjunctiva are pale and non-injected, sclera clear OROPHARYNX:no exudate, no erythema and lips, buccal mucosa, and tongue normal  NECK: supple, thyroid normal size, non-tender, without nodularity LYMPH: no palpable lymphadenopathy in the cervical, axillary or inguinal LUNGS: clear to auscultation and percussion with normal breathing effort HEART: regular rate & rhythm and no murmurs and no lower extremity edema ABDOMEN: soft, incisional tenderness, normal bowel sounds. There is a JP drain at the right lower quadrant, and a colostomy in the left lower quadrant. Incision is bandaged Musculoskeletal:no cyanosis of digits and no clubbing  PSYCH: alert & oriented x 3 with fluent speech NEURO: no focal motor/sensory deficits    CBG (last 3)   Recent Labs  11/24/14 1710 11/24/14 2135 11/25/14 0726  GLUCAP 130* 175*  143*     Labs:   Recent Labs Lab 11/21/14 0332 11/22/14 0435 11/23/14 0514 11/24/14 0546 11/25/14 0500  WBC 9.2 4.9 4.0 4.1 4.0  HGB 8.1* 7.4* 7.5* 7.0* 6.7*  HCT 24.9* 23.5* 23.6* 21.5* 21.7*  PLT 143* 137* 178 218 279  MCV 86.2 87.0 88.7 88.5 88.6  MCH 28.0 27.4 28.2  28.8 27.3  MCHC 32.5 31.5 31.8 32.6 30.9  RDW 16.4* 16.3* 16.2* 16.3* 17.1*  LYMPHSABS 1.8 1.8 1.2 1.2 1.4  MONOABS 0.7 0.5 0.5 0.4 0.5  EOSABS 0.0 0.1 0.2 0.2 0.3  BASOSABS 0.0 0.0 0.0 0.0 0.0     Chemistries:    Recent Labs Lab 11/20/14 0515 11/21/14 0332 11/22/14 0435 11/23/14 0514 11/24/14 0546  NA 136 138 137 138 139  K 4.1 4.0 3.3* 3.6 3.9  CL 105 106 100 100 102  CO2 23 29 31  32 30  GLUCOSE 223* 108* 115* 148* 137*  BUN 18 18 12 13 19   CREATININE 0.81 0.64 0.50 0.52 0.75  CALCIUM 7.8* 7.9* 8.0* 8.1* 8.4  MG 1.1* 1.9  --   --   --      Recent Labs Lab 11/24/14 0738 11/24/14 1231 11/24/14 1710 11/24/14 2135 11/25/14 0726  GLUCAP 136* 175* 130* 175* 143*    Imaging Studies:  No new imaging studies. 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 TWO LYMPH NODES, NEGATIVE FOR ATYPIA OR MALIGNANCY (0/2). RESECTION MARGINS, NEGATIVE FOR ATYPIA OR MALIGNANCY. Microscopic Comment Please see gross description for details. Aldona Bar MD  Assessment/Plan: 59 y.o.   Endometrial and ovarian cancer She was admitted on 4/19 for surgery. She was admitted to Hospital for resection of pelvic mass, radical upper vaginectomy, low anterior rectosigmoid resection, end descending colostomy, bilateral ureteral stent and cystotomy closure by Dr. Denman [name] on 11/19/14.  Path consistent With recurrent endometrioid carcinoma with squamous differentiation, involving the colonic mucosa and vaginal mucosa. 2 lymph nodes are negative for atypia or malignancy, margins are negative. I will discuss with her further as an outpatient about adjuvant treatment.  Chronic neutropenia, ow with leukocytosis She received G-CSF prior to surgery on 3/25, 4/1, 4/11, 4/15 and 4/18 ANC is 1.7 on 4/25 No intervention indicated at  this time, as leukocytosis is likely reactive, as well as due to recent injections with G-CSF Recommend that she receives G-CSF injection to keep her [place] greater than 1500 when needed during hospitalization A standing order has been placed for her to receive G-CSF whenever ANC less than 1500 over the next few days.  Anemia in neoplastic disease Due to recent surgery with acute blood loss. Patient received 2 units intraoperatively Today her Hb is 6.7   Consider transfusion to maintain a Hb of 8 as the patient is still producing serosanguineous drainage Recommend 2 more units today while she is hospitalized I will recheck her blood count at the end of the week and transfuse as needed as an outpatient  DVT prophylaxis On subcutaneous heparin Monitor platelets carefully  Thrombocytopenia, resolved This was due to malignancy, dilution, infection, blood loss; she is on heparin Monitor counts closely No transfusion is indicated at this time Transfuse 1 unit of platelets if count is less or equal than 10,000 or 20,000 if the patient is acutely bleeding Hold  Heparin if  platelets drop to less than 50,000  Full Code  Disposition Anticipated discharge over  the next 24-48 hrs if clinically stable. To see Dr. Alvy Bimler on 4/29 for post hospital follow up I will sign off.  Rondel Jumbo, PA-C 11/25/2014  7:54 AM  Macdonald Rigor, MD 11/25/2014

## 2014-11-25 NOTE — Progress Notes (Signed)
CRITICAL VALUE ALERT  Critical value received:  Hgb: 6.7  Date of notification:  11/25/2014  Time of notification:  0615  Critical value read back:Yes.    Nurse who received alert:  A. Tina Griffiths, RN  MD notified (1st page):  Denman [name], MD  Time of first page:  0645  MD notified (2nd page):  Time of second page:  Responding MD:  Denman [name], MD  Time MD responded:  [phone]

## 2014-11-25 NOTE — Discharge Instructions (Signed)
Ostomy Support Information  Yes, [name] heard that people get along just fine with only one of their eyes, or one of their lungs, or one of their kidneys. But you also know that you have only one intestine and only one bladder, and that leaves you feeling awfully empty, both physically and emotionally: You think no other people go around without part of their intestine with the ends of their intestines sticking out through their abdominal walls.  Well, you are wrong! There are nearly three quarters of a million people in the [place] who have an ostomy; people who have had surgery to remove all or part of their colons or bladders. There is even a national association, the [place] [employer] [place] with over 350 local affiliated support groups that are organized by volunteers who provide peer support and counseling. Juan Quam has a toll free telephone num-ber, ([phone] and an educational,  interactive website, www.ostomy.org   An ostomy is an opening in the belly (abdominal wall) made by surgery. Ostomates are people who have had this procedure. The opening (stoma) allows the kidney or bowel to discharge waste. An external pouch covers the stoma to collect waste. Pouches are are a simple bag and are odor free. Different companies have disposable or reusable pouches to fit one's lifestyle. An ostomy can either be temporary or permanent.  THERE ARE THREE MAIN TYPES OF OSTOMIES  Colostomy. A colostomy is a surgically created opening in the large intestine (colon).  Ileostomy. An ileostomy is a surgically created opening in the small intestine.  Urostomy. A urostomy is a surgically created opening to divert urine away from the bladder. FREQUENTLY ASKED QUESTIONS   Why havent you met any of these folks who have an ostomy?  Well, maybe you have! You just did not recognize them because an ostomy doesn't show. It can be kept secret if you wish. Why, maybe some of your best friends, office associates  or neighbors have an ostomy ... you never can tell.   People facing ostomy surgery have many quality-of-life questions like:  Will you bulge? Smell? Make noises? Will you feel waste leaving your body? Will you be a captive of the toilet? Will you starve? Be a social outcast? Get/stay married? Have babies? Easily bathe, go swimming, bend over?  OK, lets look at what you can expect:  Will you bulge?  Remember, without part of the intestine or bladder, and its contents, you should have a flatter tummy than before. You can expect to wear, with little exception, what you wore before surgery ... and this in-cludes tight clothing and bathing suits.  Will you smell?  Today, thanks to modern odor proof pouching systems, you can walk into an ostomy support group meeting and not smell anything that is foul or offensive. And, for those with an ileostomy or colostomy who are concerned about odor when emptying their pouch, there are in-pouch deodorants that can be used to eliminate any waste odors that may exist.  Will you make noises?  Everyone produces gas, especially if they are an air-swallower. But intestinal sounds that occur from time to time are no differ-ent than a gurgling tummy, and quite often your clothing will muffle any sounds.   Will you feel the waste discharges?  For those with a colostomy or ileostomy there might be a slight pressure when waste leaves your body, but understand that the intestines have no nerve endings, so there will be no unpleasant sensations. Those with a urostomy will  probably be unaware of any kidney drainage.  Will you be a captive of the toilet?  Immediately post-op you will spend more time in the bathroom than you will after your body recovers from surgery. Every person is different, but on average those with an ileostomy or urostomy may empty their pouches 4 to 6 times a day; a little  less if you have a colostomy. The average wear time between pouch system changes is 3  to 5 days and the changing process should take less than 30 minutes.  Will I need to be on a special diet? Most people return to their normal diet when they have recovered from surgery. Be sure to chew your food well, eat a well-balanced diet and drink plenty of fluids. If you experience problems with a certain food, wait a couple of weeks and try it again. Will there be odor and noises? Pouching systems are designed to be odor-proof or odor-resistant. There are deodorants that can be used in the pouch. Medications are also available to help reduce odor. Limit gas-producing foods and carbonated beverages. You will experience less gas and fewer noises as you heal from surgery. How much time will it take to care for my ostomy? At first, you may spend a lot of time learning about your ostomy and how to take care of it. As you become more comfortable and skilled at changing the pouching system, it will take very little time to care for it.  Will I be able to return to work? People with ostomies can perform most jobs. As soon as you have healed from surgery, you should be able to return to work. Heavy lifting (more than 10 pounds) may be discouraged.  What about intimacy? Sexual relationships and intimacy are important and fulfilling aspects of your life. They should continue after ostomy surgery. Intimacy-related concerns should be discussed openly between you and your partner.  Can I wear regular clothing? You do not need to wear special clothing. Ostomy pouches are fairly flat and barely noticeable. Elastic undergarments will not hurt the stoma or prevent the ostomy from functioning.  Can I participate in sports? An ostomy should not limit your involvement in sports. Many people with ostomies are runners, skiers, swimmers or participate in other active lifestyles. Talk with your caregiver first before doing heavy physical activity.  Will you starve?  Not if you follow doctors orders at each stage of  your post-op adjustment. There is no such thing as an ostomy diet. Some people with an ostomy will be able to eat and tolerate anything; others may find diffi-culty with some foods. Each person is an individual and must determine, by trial, what is best for them. A good practice for all is to drink plenty of water.  Will you be a social outcast?  Have you met anyone who has an ostomy and is a social outcast? Why should you be the first? Only your attitude and self image will effect how you are treated. No confi-dent person is an Occupational psychologist.   PROFESSIONAL HELP  Resources are available if you need help or have questions about your ostomy.    Specially trained nurses called Wound, Ostomy Continence Nurses (WOCN) are available for consultation in most major medical centers.   Consider getting an ostomy consult with Cena Benton at [facility] to help troubleshoot stoma pouch fittings and other issues with your ostomy: [phone]   The [place] (UOA) is a group made up of many  local chapters throughout the [place]. These local groups hold meetings and provide support to prospective and existing ostomates. They sponsor educational events and have qualified visitors to make personal or telephone visits. Contact the UOA for the chapter nearest you and for other educational publications.  More detailed information can be found in Colostomy Guide, a publication of the [employer] (UOA). Contact UOA at 1-(985)197-8128 or visit their web site at [email or web address] The website contains links to other sites, suppliers and resources. Document Released: 07/22/2003 Document Revised: 10/11/2011 Document Reviewed: 11/20/2008 [facility] Patient Information 2013 [place].  ABDOMINAL SURGERY: POST OP INSTRUCTIONS  1. DIET: Follow a light bland diet the first 24 hours after arrival home, such as soup, liquids, crackers, etc.  Be sure to include lots of fluids daily.   Avoid fast food or heavy meals as your are more likely to get nauseated.  Eat a low fat the next few days after surgery.   2. Take your usually prescribed home medications unless otherwise directed. 3. PAIN CONTROL: a. Pain is best controlled by a usual combination of three different methods TOGETHER: i. Ice/Heat ii. Over the counter pain medication iii. Prescription pain medication b. Most patients will experience some swelling and bruising around the incisions.  Ice packs or heating pads (30-60 minutes up to 6 times a day) will help. Use ice for the first few days to help decrease swelling and bruising, then switch to heat to help relax tight/sore spots and speed recovery.  Some people prefer to use ice alone, heat alone, alternating between ice & heat.  Experiment to what works for you.  Swelling and bruising can take several weeks to resolve.   c. It is helpful to take an over-the-counter pain medication regularly for the first few weeks.  Choose one of the following that works best for you: i. Naproxen (Aleve, etc)  Two 228m tabs twice a day ii. Ibuprofen (Advil, etc) Three 2048mtabs four times a day (every meal & bedtime) iii. Acetaminophen (Tylenol, etc) 500-65036mour times a day (every meal & bedtime) d. A  prescription for pain medication (such as oxycodone, hydrocodone, etc) should be given to you upon discharge.  Take your pain medication as prescribed.  i. If you are having problems/concerns with the prescription medicine (does not control pain, nausea, vomiting, rash, itching, etc), please call us [phone] see if we need to switch you to a different pain medicine that will work better for you and/or control your side effect better. ii. If you need a refill on your pain medication, please contact your pharmacy.  They will contact our office to request authorization. Prescriptions will not be filled after 5 pm or on week-ends. 4. Avoid getting constipated.  Between the surgery  and the pain medications, it is common to experience some constipation.  Increasing fluid intake and taking a fiber supplement (such as Metamucil, Citrucel, FiberCon, MiraLax, etc) 1-2 times a day regularly will usually help prevent this problem from occurring.  A mild laxative (prune juice, Milk of Magnesia, MiraLax, etc) should be taken according to package directions if there are no bowel movements after 48 hours.   5. Watch out for diarrhea.  If you have many loose bowel movements, simplify your diet to bland foods & liquids for a few days.  Stop any stool softeners and decrease your fiber supplement.  Switching to mild anti-diarrheal medications (Kayopectate, Pepto Bismol) can help.  If this worsens or does not improve, please  call us. 6. Wash / shower every day.  You may shower over the incision / wound.  Avoid baths until the skin is fully healed.  Continue to shower over incision(s) after the dressing is off. 7. Remove your waterproof bandages 5 days after surgery.  You may leave the incision open to air.  Remove any wicks or ribbons in your wound.  If you have an open wound, please see wound care instructions. You may replace a dressing/Band-Aid to cover the incision for comfort if you wish. 8. ACTIVITIES as tolerated:   a. You may resume regular (light) daily activities beginning the next day--such as daily self-care, walking, climbing stairs--gradually increasing activities as tolerated.  If you can walk 30 minutes without difficulty, it is safe to try more intense activity such as jogging, treadmill, bicycling, low-impact aerobics, swimming, etc. b. Save the most intensive and strenuous activity for last such as sit-ups, heavy lifting, contact sports, etc  Refrain from any heavy lifting or straining until you are off narcotics for pain control.   c. DO NOT PUSH THROUGH PAIN.  Let pain be your guide: If it hurts to do something, don't do it.  Pain is your body warning you to avoid that activity for  another week until the pain goes down. d. You may drive when you are no longer taking prescription pain medication, you can comfortably wear a seatbelt, and you can safely maneuver your car and apply brakes. e. Dennis Bast may have sexual intercourse when it is comfortable.  9. FOLLOW UP in our office a. Please call [facility] at [phone] to set up an appointment to see your surgeon in the office for a follow-up appointment approximately 1-2 weeks after your surgery. b. Make sure that you call for this appointment the day you arrive home to insure a convenient appointment time. 10. IF YOU HAVE DISABILITY OR FAMILY LEAVE FORMS, BRING THEM TO THE OFFICE FOR PROCESSING.  DO NOT GIVE THEM TO YOUR DOCTOR.   WHEN TO CALL us [phone]: 1. Poor pain control 2. Reactions / problems with new medications (rash/itching, nausea, etc)  3. Fever over 101.5 F (38.5 C) 4. Foley not draining urine 5. Nausea and/or vomiting 6. Worsening swelling or bruising 7. Continued bleeding from incision. 8. Increased pain, redness, or drainage from the incision  The clinic staff is available to answer your questions during regular business hours (8:30am-5pm).  Please dont hesitate to call and ask to speak to one of our nurses for clinical concerns.   A surgeon from [facility] is always on call at the hospitals   If you have a medical emergency, go to the nearest emergency room or call 911.    [facility], [address], [place], [place], [place]  [address] ? MAIN: [phone] ? TOLL FREE: [phone] ? FAX [phone] [email or web address]  GETTING TO GOOD BOWEL HEALTH. Irregular bowel habits such as constipation and diarrhea can lead to many problems over time.  Having one soft bowel movement a day is the most important way to prevent further problems.  The anorectal canal is designed to handle stretching and feces to safely manage our ability to get rid of solid  waste (feces, poop, stool) out of our body.  BUT, hard constipated stools can act like ripping concrete bricks and diarrhea can be a burning fire to this very sensitive area of our body, causing inflamed hemorrhoids, anal fissures, increasing risk is perirectal abscesses, abdominal pain/bloating, an  making irritable bowel worse.     The goal: ONE SOFT BOWEL MOVEMENT A DAY!  To have soft, regular bowel movements:   Drink at least 8 tall glasses of water a day.    Take plenty of fiber.  Fiber is the undigested part of plant food that passes into the colon, acting s natures broom to encourage bowel motility and movement.  Fiber can absorb and hold large amounts of water. This results in a larger, bulkier stool, which is soft and easier to pass. Work gradually over several weeks up to 6 servings a day of fiber (25g a day even more if needed) in the form of: o Vegetables -- Root (potatoes, carrots, turnips), leafy green (lettuce, salad greens, celery, spinach), or cooked high residue (cabbage, broccoli, etc) o Fruit -- Fresh (unpeeled skin & pulp), Dried (prunes, apricots, cherries, etc ),  or stewed ( applesauce)  o Whole grain breads, pasta, etc (whole wheat)  o Bran cereals   Bulking Agents -- This type of water-retaining fiber generally is easily obtained each day by one of the following:  o Psyllium bran -- The psyllium plant is remarkable because its ground seeds can retain so much water. This product is available as Metamucil, Konsyl, Effersyllium, Per Diem Fiber, or the less expensive generic preparation in drug and health food stores. Although labeled a laxative, it really is not a laxative.  o Methylcellulose -- This is another fiber derived from wood which also retains water. It is available as Citrucel. o Polyethylene Glycol - and artificial fiber commonly called Miralax or Glycolax.  It is helpful for people with gassy or bloated feelings with regular fiber o Flax Seed - a less gassy fiber  than psyllium  No reading or other relaxing activity while on the toilet. If bowel movements take longer than 5 minutes, you are too constipated  AVOID CONSTIPATION.  High fiber and water intake usually takes care of this.  Sometimes a laxative is needed to stimulate more frequent bowel movements, but   Laxatives are not a good long-term solution as it can wear the colon out. o Osmotics (Milk of Magnesia, Fleets phosphosoda, Magnesium citrate, MiraLax, GoLytely) are safer than  o Stimulants (Senokot, Castor Oil, Dulcolax, Ex Lax)    o Do not take laxatives for more than 7days in a row.   IF SEVERELY CONSTIPATED, try a Bowel Retraining Program: o Do not use laxatives.  o Eat a diet high in roughage, such as bran cereals and leafy vegetables.  o Drink six (6) ounces of prune or apricot juice each morning.  o Eat two (2) large servings of stewed fruit each day.  o Take one (1) heaping tablespoon of a psyllium-based bulking agent twice a day. Use sugar-free sweetener when possible to avoid excessive calories.  o Eat a normal breakfast.  o Set aside 15 minutes after breakfast to sit on the toilet, but do not strain to have a bowel movement.  o If you do not have a bowel movement by the third day, use an enema and repeat the above steps.   Controlling diarrhea o Switch to liquids and simpler foods for a few days to avoid stressing your intestines further. o Avoid dairy products (especially milk & ice cream) for a short time.  The intestines often can lose the ability to digest lactose when stressed. o Avoid foods that cause gassiness or bloating.  Typical foods include beans and other legumes, cabbage, broccoli, and dairy foods.  Every  person has some sensitivity to other foods, so listen to our body and avoid those foods that trigger problems for you. o Adding fiber (Citrucel, Metamucil, psyllium, Miralax) gradually can help thicken stools by absorbing excess fluid and retrain the intestines to  act more normally.  Slowly increase the dose over a few weeks.  Too much fiber too soon can backfire and cause cramping & bloating. o Probiotics (such as active yogurt, Align, etc) may help repopulate the intestines and colon with normal bacteria and calm down a sensitive digestive tract.  Most studies show it to be of mild help, though, and such products can be costly. o Medicines: - Bismuth subsalicylate (ex. Kayopectate, Pepto Bismol) every 30 minutes for up to 6 doses can help control diarrhea.  Avoid if pregnant. - Loperamide (Immodium) can slow down diarrhea.  Start with two tablets (57m total) first and then try one tablet every 6 hours.  Avoid if you are having fevers or severe pain.  If you are not better or start feeling worse, stop all medicines and call your doctor for advice o Call your doctor if you are getting worse or not better.  Sometimes further testing (cultures, endoscopy, X-ray studies, bloodwork, etc) may be needed to help diagnose and treat the cause of the diarrhea.  Managing Pain  Pain after surgery or related to activity is often due to strain/injury to muscle, tendon, nerves and/or incisions.  This pain is usually short-term and will improve in a few months.   Many people find it helpful to do the following things TOGETHER to help speed the process of healing and to get back to regular activity more quickly:  1. Avoid heavy physical activity at first a. No lifting greater than 20 pounds at first, then increase to lifting as tolerated over the next few weeks b. Do not push through the pain.  Listen to your body and avoid positions and maneuvers than reproduce the pain.  Wait a few days before trying something more intense c. Walking is okay as tolerated, but go slowly and stop when getting sore.  If you can walk 30 minutes without stopping or pain, you can try more intense activity (running, jogging, aerobics, cycling, swimming, treadmill, sex, sports, weightlifting, etc  ) d. Remember: If it hurts to do it, then dont do it!  2. Take Anti-inflammatory medication i. Choose Acetaminophen 5064mtabs (Tylenol) 1-2 pills with every meal and just before bedtime (avoid if you have liver problems) a. Take with food/snack around the clock for 1-2 weeks i. This helps the muscle and nerve tissues become less irritable and calm down faster  3. Use a Heating pad or Ice/Cold Pack a. 4-6 times a day b. May use warm bath/hottub  or showers  4. Try Gentle Massage and/or Stretching  a. at the area of pain many times a day b. stop if you feel pain - do not overdo it  Try these steps together to help you body heal faster and avoid making things get worse.  Doing just one of these things may not be enough.    If you are not getting better after two weeks or are noticing you are getting worse, contact our office for further advice; we may need to re-evaluate you & see what other things we can do to help.  Uterine Cancer Uterine cancer is an abnormal growth of tissue (tumor) in the uterus that is cancerous (malignant). Unlike noncancerous (benign) tumors, malignant tumors can spread to other  parts of your body. The wall of the uterus has two layers of tissue. The inner layer is the endometrium. The outer layer of muscle tissue is the myometrium. The most common type of uterine cancer begins in the endometrium. This is called endometrial cancer. Cancer that begins in the myometrium is called uterine sarcoma, which is very rare.  RISK FACTORS  Although the exact cause of uterine cancer is unknown, there are a number of risk factors that can increase your chances of getting uterine cancer. They include:  Your age. Uterine cancer occurs mostly in women older than 50 years.   Having an enlarged endometrium (endometrial hyperplasia).   Using hormone therapy.   Obesity.   Taking the drug tamoxifen.   White race.   Infertility.   Never being pregnant.   Beginning  menstrual periods at an age younger than 12 years.   Having menstrual periods at an age older than 84 years.   Personal history of ovarian, intestinal, or colorectal cancer.   Having a family history of uterine cancer.   Having a family history of hereditary nonpolyposis colon cancer (HNPCC).   Having diabetes, high blood pressure, thyroid disease, or gallbladder disease.   Long-term use of high-dose birth control pills.   Exposure to radiation.   Smoking.  SIGNS AND SYMPTOMS   Abnormal vaginal bleeding or discharge. Bleeding may start as a watery, blood-streaked flow that gradually contains more blood.   Any vaginal bleeding after menopause.   Difficult or painful urination.   Pain during intercourse.   Pain in the pelvic area.  Mass in the vagina.  Pain or fullness in the abdomen.  Frequent urination.  Bleeding between periods.  Growth of the stomach.   Unexplained weight loss.  Uterine cancer usually occurs after menopause. However, it may also occur around the time that menopause begins. Abnormal vaginal bleeding is the most common symptom of uterine cancer. Women should not assume that abnormal vaginal bleeding is part of menopause. DIAGNOSIS  Your health care provider will ask about your medical history. He or she may also perform a number of procedures, such as:  A physical and pelvic exam. Your health care provider will feel your pelvis for any lumps.   Blood and urine tests.   X-rays.   Imaging tests, such as CT scans, ultrasonography, or MRIs.   A hysteroscopy to view the inside of your uterus.   A Pap test to sample cells from the cervix and upper vagina to check for abnormal cells.   Taking a tissue sample (biopsy) from the uterine lining to look for cancer cells.   A dilation and curettage (D&C). This involves stretching (dilation) the cervix and scraping (curettage) the inside lining of the uterus to get a tissue sample.  The sample is examined under a microscope to look for cancer cells.  Your cancer will be staged to determine its severity and extent. Staging is a careful attempt to find out the size of the tumor, whether the cancer has spread, and if so, to what parts of the body. You may need to have more tests to determine the stage of your cancer. The test results will help determine what treatment plan is best for you. Cancer stages include:   Stage I. The cancer is only found in the uterus.  Stage II. The cancer has spread to the cervix.  Stage III. The cancer has spread outside the uterus, but not outside the pelvis. The cancer may have spread to  the lymph nodes in the pelvis.  Stage IV. The cancer has spread to other parts of the body, such as the bladder or rectum. TREATMENT  Most women with uterine cancer are treated with surgery. This includes removing the uterus, cervix, fallopian tubes, and ovaries (total hysterectomy). Your lymph nodes near the tumor may also be removed. Some women have radiation, chemotherapy, or hormonal therapy. Other women have a combination of these therapies. HOME CARE INSTRUCTIONS   Take medicines only as directed by your health care provider.   Maintain a healthy diet.  Exercise regularly.   If you have diabetes, high blood pressure, thyroid disease, or gallbladder disease, follow your health care provider's instructions to keep it under control.   Do not smoke.   Consider joining a support group. This may help you learn to cope with the stress of having uterine cancer.   Seek advice to help you manage treatment side effects.   Keep all follow-up visits as directed by your health care provider.  SEEK MEDICAL CARE IF:  You have increased stomach or pelvic pain.  You cannot urinate.  You have abnormal bleeding. Document Released: 07/19/2005 Document Revised: 12/03/2013 Document Reviewed: 01/05/2013 [facility] Patient Information 2015 [place], [place].  This information is not intended to replace advice given to you by your health care provider. Make sure you discuss any questions you have with your health care provider.

## 2014-11-25 NOTE — Progress Notes (Signed)
I have an extensive discussion with Dr. Denman [name] who felt that the patient does not need blood. She would prefer the patient does not get blood transfusion. She will only transfuse the patient if the patient complains of symptoms of anemia.  Dr. Denman [name] does not feel that is the definitive transfusion threshold that needs to be met for this patient. I will proceed to cancel the blood transfusion ordered today.

## 2014-11-25 NOTE — Care Management Note (Signed)
    Page 1 of 2   11/25/2014     1:27:47 PM CARE MANAGEMENT NOTE 11/25/2014  Patient:  [facility]   Account Number:  [id number]  Date Initiated:  11/20/2014  Documentation initiated by:  DAVIS,RHONDA  Subjective/Objective Assessment:   LAPAROSCOPIC/ROBOTIC LYSIS OF ADHESIONS*  EXPLORATORY LAPAROTOMY  remained intubated post op     Action/Plan:   home when stable will follow for any dc needs   Anticipated DC Date:  11/23/2014   Anticipated DC Plan:  [place]  In-house referral  NA      DC Planning Services  CM consult      PAC Choice  [place]   Choice offered to / List presented to:  C-1 Patient   DME arranged  Vassie Moselle      DME agency  [place] arranged  HH-10 DISEASE MANAGEMENT  HH-1 RN      [place].   Status of service:  Completed, signed off Medicare Important Message given?   (If response is "NO", the following Medicare IM given date fields will be blank) Date Medicare IM given:   Medicare IM given by:   Date Additional Medicare IM given:   Additional Medicare IM given by:    Discharge Disposition:  [place]  Per UR Regulation:  Reviewed for med. necessity/level of care/duration of stay  If discussed at [place] of Stay Meetings, dates discussed:    Comments:  11-25-14 [place] [address]25 Spoke with patient at bedside. Wants a [place] agency that is preferred by insurance provider. Benefits check states any provider that takes BCBS. Provided patient with list of agencies, chose [facility]. Contacted [facility] to arrange, awaiting final orders.  November 20, 2014/Rhonda L. Rosana Hoes, RN, BSN, CCM. Case Management [place] [phone] No discharge needs present of time of review.

## 2014-11-25 NOTE — Progress Notes (Signed)
Lovenox teaching provided to patient husband and daughter. Both husband and daughter were able to provide information for teach back.  Questions answered

## 2014-11-25 NOTE — Progress Notes (Signed)
Information sheets provided to patient and family for foley catheter home care, colostomy home care, bulb drain home care.  Patient also given JP drain record sheet with instruction.

## 2014-11-25 NOTE — Progress Notes (Signed)
Addendum:  We discussed some of the risks, benefits, and alternatives of blood transfusions. The patient is symptomatic from anemia and the hemoglobin level is critically low.  Some of the side-effects to be expected including risks of transfusion reactions, chills, infection, syndrome of volume overload and risk of hospitalization from various reasons and the patient is willing to proceed and went ahead to sign consent today. I will proceed to give HER 2 units of blood. The patient understood the rationale for transfusion. The patient has neutropenia and had been receiving G-CSF. I suspect she may have some bone marrow damage from prior treatment and hence I felt that her bone marrow recovery with the slower than other patients. I will follow-up with another CBC at the end of the week.

## 2014-11-25 NOTE — Progress Notes (Signed)
Patient leg bag instruction reinforced.  Discussed emptying Colostomy bag with patient and husband

## 2014-11-25 NOTE — Progress Notes (Signed)
Patient ID: [name], female   DOB: [birth year], 59 y.o.   MRN: [id number] 6 Days Post-Op Procedure(s) (LRB): ROBOTIC LYSIS OF ADHESIONS, CONVERTED TO LAPAROTOMY RADICAL UPPER VAGINECTOMY,LOW ANTERIOR BOWEL RESECTION, COLOSTOMY, BILATERAL URETERAL STENT PLACEMENT AND CYSTONOMY CLOSURE (N/A) XI ROBOTIC ASSISTED LOWER ANTERIOR RESECTION (N/A) OSTOMY (N/A)   Subjective: Tolerating diet. Ambulating without dizziness or SOB. Ostomy functioning. Pain well controlled  Objective: Vital signs in last 24 hours: Temp:  [98.2 F (36.8 C)-98.4 F (36.9 C)] 98.2 F (36.8 C) (04/25 0505) Pulse Rate:  [87-92] 87 (04/25 0505) Resp:  [16-18] 16 (04/25 0505) BP: (144-155)/(67-70) 155/70 mmHg (04/25 0505) SpO2:  [95 %-99 %] 99 % (04/25 0505) Last BM Date: 11/24/14  Intake/Output from previous day: 04/24 0701 - 04/25 0700 In: 1160 [P.O.:1080] Out: [id number] [Urine:700; Drains:465; Stool:600]  Physical Examination:  General: alert and cooperative Resp: clear to auscultation bilaterally Cardio: regular rate and rhythm, S1, S2 normal, no murmur, click, rub or gallop GI: soft, non-tender; bowel sounds normal; no masses,  no organomegaly and incision: clean, intact and bloody and small volume drainage present Extremities: extremities normal, atraumatic, no cyanosis or edema Vaginal Bleeding: none  Colostomy: pink, viable, stool output noted Drain from RLQ (in pelvis): serosanguinous output.   Labs: WBC/Hgb/Hct/Plts:  4.0/6.7/21.7/279 (04/25 0500)     BMET    Component Value Date/Time   NA 139 11/24/2014 0546   NA 141 09/12/2014 0857   K 3.9 11/24/2014 0546   K 4.0 09/12/2014 0857   CL 102 11/24/2014 0546   CO2 30 11/24/2014 0546   CO2 28 09/12/2014 0857   GLUCOSE 137* 11/24/2014 0546   GLUCOSE 121 09/12/2014 0857   BUN 19 11/24/2014 0546   BUN 15.4 09/12/2014 0857   CREATININE 0.75 11/24/2014 0546   CREATININE 0.7 09/12/2014 0857   CREATININE 0.84 08/25/2014 1538   CALCIUM 8.4 11/24/2014  0546   CALCIUM 9.5 09/12/2014 0857   GFRNONAA >90 11/24/2014 0546   GFRNONAA 76 08/25/2014 1538   GFRAA >90 11/24/2014 0546   GFRAA 87 08/25/2014 1538     Assessment:  59 y.o. s/p Procedure(s): ROBOTIC LYSIS OF ADHESIONS, CONVERTED TO LAPAROTOMY RADICAL UPPER VAGINECTOMY,LOW ANTERIOR BOWEL RESECTION, COLOSTOMY, BILATERAL URETERAL STENT PLACEMENT AND CYSTONOMY CLOSURE XI ROBOTIC ASSISTED LOWER ANTERIOR RESECTION OSTOMY: stable Pain:  Pain is controlled on prn medications.  Heme: Anemia: stable--minimal downward trend.  Minimal symptoms. No tachycardia or hypotension. Based on data that supports increased peri-operative complications associated with transfusions in asymptomatic patients with Hb >6g/dL, I recommend no transfusion at this time.  ID: Chronic idiopathic neutropenia. WBC stable.  Mild thrombophlebitis at IV site--stable - continue clinda  CV: Hx of HTN.  B/Ps in range. Would restart home BP meds on discharge.  GI:  Tolerating po: yes. Colostomy: appears viable and well perfused, functioning with stool.   GU: Urine output blood-tinged, adequate. JP drain shows creatinine mildly elevated above serum level - plan to keep JP until recheck of JP and serum creatinine on 11/29/14.  Endo: Diabetes mellitus Type II, under good control.[name] Kitchen   Prophylaxis: pharmacologic prophylaxis (with any of the following: unfractionated SQ heparin 5000 units 2 hours prior to surgery then every 12 hours). Plan for home on Lovenox.  Plan: Leg bag teaching JP bag - teaching Lovenox teaching (for 1 month duration use). Dispo:  Discharge plan to include: anticipate discharge to home today with followup with me for staples and drain out on 11/29/14.    LOS: 6 days    Denman [name],  Renato Battles 11/25/2014, 10:16 AM

## 2014-11-25 NOTE — Progress Notes (Signed)
Discharge instructions given along with prescriptions.  Questions answered

## 2014-11-25 NOTE — Plan of Care (Signed)
Problem: Phase III Progression Outcomes Goal: Voiding independently Outcome: Adequate for Discharge Pt to be D/C'd with foley catheter

## 2014-11-25 NOTE — Progress Notes (Signed)
[facility]  [address] [place], [place] [address] Phone: [phone] FAX: [phone]    [name] [id number] [birth year]  CARE TEAM:  PCP: Leana Gamer., MD  Outpatient Care Team: Patient Care Team: Leana Gamer, MD as PCP - General (Internal Medicine) [name], MD as Referring Physician (Obstetrics and Gynecology) Heath Lark, MD as Consulting Physician (Hematology and Oncology) Everitt Amber, MD as Consulting Physician (Obstetrics and Gynecology) Michael [name], MD as Consulting Physician (General Surgery)  Inpatient Treatment Team: Treatment Team: Attending Provider: Everitt Amber, MD; Physician Assistant: Rondel Jumbo, PA-C; Consulting Physician: Heath Lark, MD; Nurse Practitioner: Dorothyann Gibbs, NP; Consulting Physician: Michael [name], MD; Respiratory Therapist: Romeo Apple, RRT; Registered Nurse: Sonda Rumble, RN; Technician: Leeanne Rio, NT  Problem List:   Active Problems:   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 s/p resection/partial vaginectomy/ LAR/colostomy 11/19/2014   History of ovarian cancer   Acute respiratory failure with hypoxia   Endotracheally intubated   Morbid obesity   6 Days Post-Op  Procedure(s): POST-OPERATIVE DIAGNOSIS: 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 TWO LYMPH NODES, NEGATIVE FOR ATYPIA OR MALIGNANCY (0/2). RESECTION  MARGINS, NEGATIVE FOR ATYPIA OR MALIGNANCY.  Microscopic Comment Please see [name] description for details. [name] Pathologist, Electronic Signature (Case signed 11/21/2014) Specimen [name] and Clinical Information Specimen(s) Obtained: Colon, segmental resection for tumor, rectosigmoid colon with upper vagina Specimen Clinical Information endometrial cancer (kp) [name] The specimen is received in formalin labeled rectosigmoid colon and upper vagina, and consists of an 18.1 cm in length portion of colon, with the proximal margin stapled and the distal margin open. On the anterior aspect of the colon, there is a 2.7 x 2.6 cm slightly disrupted circular portion of gray-brown mucosa designated by a stitch, which is clinically stated as posterior vaginal wall. The resection margin surrounding the vaginal tissue is inked black, and the distal resection margin is inked black for orientation. The soft tissue surrounding the vaginal tissue along the right side is slightly firm. The lumen of the colon contains a small amount of green-brown fecal material, and the mucosa is tan-pink with normal folding. No mucosal lesions are grossly identified, however, palpating the colon, which overlies the indurated adipose tissue reveals a firm nodule within the possible submucosa and muscularis. The nodule measures approximately 0.6 cm, and is 2.9 cm from the distal resection margin. Sectioning through the distal aspect of the colon and underlying soft tissue reveals a 4.6 x 4.5 x 4.0 cm tan-red, hemorrhagic, softened lesion within the 1 of 2 FINAL for [name], [name] ([id number]) [name](continued) underlying adipose tissue. The lesion invades into the overlying muscularis propria at the area previously noted. The colon wall measures 0.5 cm in thickness. Three tan-pink possible lymph nodes are identified, ranging from 0.2 cm to 0.5 cm in greatest dimension. Please note the patient is status post  chemotherapy. Representative sections are submitted in twelve cassettes. A = proximal resection margin. B, C = distal resection margin. D - F = lesion to overlying colon. G, H = lesion. I, J =  vaginal mucosa. K = uninvolved mucosa. L = three possible lymph nodes. (KL:ecj 11/20/2014) Report signed out from the following location(s) Technical component and interpretation was performed at [place]:  Solid diet w flatus/stool.    Remove dressing per colon pathway  Anemia.  Low Hgb - follow - transfuse if Hgb <7 and symptomatic if GynOnc Dr Rossi/LJM agree.  Pt asymptomatic.  Dr Denman [name] wishes to hold off on transfusion.  Pt agrees.  Pain control -  Continue tylenol RTC.  Wean PCA & change to PO oxycodone w IV Dilaudid backup.  Hold NSAIDs with falling Hgb  Colostomy care.  WOCN involved.  Try to set up [facility]  Foley - DO NOT REMOVE due to bladder repair & ureteral stenting.  She had complex cystotomy repair (trigone and base) and ureteral stenting. DO NOT REMOVE FOLEY CATHETER - will perform retrograde cystogram at 2-3 weeks and will d.c foley at that time IF no leak noted.   DRAIN:  Per Dr Denman [name] / Gyn Onc, near end of hospital stay (?Mon),  check JP creatinine prior to removing drain (if elevated above serum levels would keep JP in until foley comes out/bladder integrity confirmed on imaging). Remove ureteral stents at 6 weeks if no stricture/leak on CT urogram.  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 TWO LYMPH NODES, NEGATIVE FOR ATYPIA OR MALIGNANCY (0/2). I am skeptical that only 2 LN were found in 18cm of intact mesorectum & mesocolon although not a colon adenoCA = may not be usually drainage point for the recurrent endometriod CA  RESECTION MARGINS, NEGATIVE FOR ATYPIA OR MALIGNANCY.  Improve glc control - resistent SSI.  Restarting PO hypoglycemics  HTN -  OK off meds - restart gradually.  GERD - protonix daily  -VTE prophylaxis- SCDs, etc  -mobilize as tolerated to help recovery  D/C patient from hospital when patient meets criteria (anticipate later today):  Tolerating oral intake well Ambulating in walkways Adequate pain control without IV medications Urinating  Having flatus   I updated the patient's status to the patient & family.  Recommendations were made.  Questions were answered.  The patient expressed understanding & appreciation.   [name], M.D., F.A.C.S. Gastrointestinal and Minimally Invasive Surgery [facility] [address] [place], [place] [address] [phone] Main / Paging   11/25/2014  Subjective:  Moved to 3W onc floor Pain minimized w Dilaudid PCA.  Fent never worked well for her [employer] in hallways  Objective:  Vital signs:  Filed Vitals:   11/24/14 0600 11/24/14 1413 11/24/14 2142 11/25/14 0505  BP: 130/46 144/67  155/70  Pulse: 87 92 90 87  Temp: 98.2 F (36.8 C) 98.3 F (36.8 C) 98.4 F (36.9 C) 98.2 F (36.8 C)  TempSrc: Oral Oral Oral Oral  Resp: 16 18 16 16   Height:      Weight:      SpO2: 96% 99% 95% 99%    Last BM Date: 11/24/14  Intake/Output   Yesterday:  04/24 0701 - 04/25 0700 In: 1160 [P.O.:1080] Out: [id number] [Urine:700; Drains:465; Stool:600] This shift:     Bowel function:  Flatus: large volume  BM: n  Drain: thinly serosanguinous  Physical Exam:  General: Pt awake/alert/oriented in no acute distress.  Smiling, chatty Eyes: PERRL, normal EOM.  Sclera clear.  No icterus Neuro: CN II-XII intact w/o focal sensory/motor deficits. Lymph:  No head/neck/groin lymphadenopathy Psych:  No delerium/psychosis/paranoia HENT: Normocephalic, Mucus membranes moist.  No thrush.  ETT in place Neck: Supple, No tracheal deviation Chest: No chest wall pain w good excursion CV:  Pulses intact.  Regular rhythm MS: Normal  AROM mjr joints.  No obvious deformity Abdomen: Soft.  Nondistended.  Dressings w old blood.  Mildly tender at incisions only.  No evidence of peritonitis.  No incarcerated hernias. Ext:  SCDs BLE.  No mjr edema.  No cyanosis Skin: No petechiae / purpura  Results:   Labs: Results for orders placed or performed during the hospital encounter of 11/19/14 (from the past 48 hour(s))  Glucose, capillary     Status: Abnormal   Collection Time: 11/23/14 11:33 AM  Result Value Ref Range   Glucose-Capillary 200 (H) 70 - 99 mg/dL  Glucose, capillary     Status: Abnormal   Collection Time: 11/23/14  6:33 PM  Result Value Ref Range   Glucose-Capillary 147 (H) 70 - 99 mg/dL  Glucose, capillary     Status: Abnormal   Collection Time: 11/23/14  9:58 PM  Result Value Ref Range   Glucose-Capillary 182 (H) 70 - 99 mg/dL  Basic metabolic panel     Status: Abnormal   Collection Time: 11/24/14  5:46 AM  Result Value Ref Range   Sodium 139 135 - 145 mmol/L   Potassium 3.9 3.5 - 5.1 mmol/L   Chloride 102 96 - 112 mmol/L   CO2 30 19 - 32 mmol/L   Glucose, Bld 137 (H) 70 - 99 mg/dL   BUN 19 6 - 23 mg/dL   Creatinine, Ser 0.75 0.50 - 1.10 mg/dL   Calcium 8.4 8.4 - 10.5 mg/dL   GFR calc non Af Amer >90 >90 mL/min   GFR calc Af Amer >90 >90 mL/min    Comment: (NOTE) The eGFR has been calculated using the CKD EPI equation. This calculation has not been validated in all clinical situations. eGFR's persistently <90 mL/min signify possible Chronic Kidney Disease.    Anion gap 7 5 - 15  CBC with Differential     Status: Abnormal   Collection Time: 11/24/14  5:46 AM  Result Value Ref Range   WBC 4.1 4.0 - 10.5 K/uL   RBC 2.43 (L) 3.87 - 5.11 MIL/uL   Hemoglobin 7.0 (L) 12.0 - 15.0 g/dL   HCT 21.5 (L) 36.0 - 46.0 %   MCV 88.5 78.0 - 100.0 fL   MCH 28.8 26.0 - 34.0 pg   MCHC 32.6 30.0 - 36.0 g/dL   RDW 16.3 (H) 11.5 - 15.5 %   Platelets 218 150 - 400 K/uL   Neutrophils Relative % 54 43 - 77 %    Neutro Abs 2.2 1.7 - 7.7 K/uL   Lymphocytes Relative 30 12 - 46 %   Lymphs Abs 1.2 0.7 - 4.0 K/uL   Monocytes Relative 10 3 - 12 %   Monocytes Absolute 0.4 0.1 - 1.0 K/uL   Eosinophils Relative 5 0 - 5 %   Eosinophils Absolute 0.2 0.0 - 0.7 K/uL   Basophils Relative 1 0 - 1 %   Basophils Absolute 0.0 0.0 - 0.1 K/uL  Glucose, capillary     Status: Abnormal   Collection Time: 11/24/14  7:38 AM  Result Value Ref Range   Glucose-Capillary 136 (H) 70 - 99 mg/dL  Glucose, capillary     Status: Abnormal   Collection Time: 11/24/14 12:31 PM  Result Value Ref Range  Glucose-Capillary 175 (H) 70 - 99 mg/dL  Glucose, capillary     Status: Abnormal   Collection Time: 11/24/14  5:10 PM  Result Value Ref Range   Glucose-Capillary 130 (H) 70 - 99 mg/dL   Comment 1 Notify RN   Glucose, capillary     Status: Abnormal   Collection Time: 11/24/14  9:35 PM  Result Value Ref Range   Glucose-Capillary 175 (H) 70 - 99 mg/dL   Comment 1 Notify RN    Comment 2 Document in Chart   CBC with Differential     Status: Abnormal   Collection Time: 11/25/14  5:00 AM  Result Value Ref Range   WBC 4.0 4.0 - 10.5 K/uL   RBC 2.45 (L) 3.87 - 5.11 MIL/uL   Hemoglobin 6.7 (LL) 12.0 - 15.0 g/dL    Comment: REPEATED TO VERIFY CRITICAL RESULT CALLED TO, READ BACK BY AND VERIFIED WITH: A LEMONS RN AT 0610 ON 04.25.16 BY SHUEA    HCT 21.7 (L) 36.0 - 46.0 %   MCV 88.6 78.0 - 100.0 fL   MCH 27.3 26.0 - 34.0 pg   MCHC 30.9 30.0 - 36.0 g/dL   RDW 17.1 (H) 11.5 - 15.5 %   Platelets 279 150 - 400 K/uL   Neutrophils Relative % 43 43 - 77 %   Neutro Abs 1.7 1.7 - 7.7 K/uL   Lymphocytes Relative 34 12 - 46 %   Lymphs Abs 1.4 0.7 - 4.0 K/uL   Monocytes Relative 14 (H) 3 - 12 %   Monocytes Absolute 0.5 0.1 - 1.0 K/uL   Eosinophils Relative 8 (H) 0 - 5 %   Eosinophils Absolute 0.3 0.0 - 0.7 K/uL   Basophils Relative 1 0 - 1 %   Basophils Absolute 0.0 0.0 - 0.1 K/uL  Urology - JP creatinine     Status: None    Collection Time: 11/25/14  5:25 AM  Result Value Ref Range   Creat, Fluid 0.8 mg/dL    Comment: (NOTE) No normal range established for this test Results should be evaluated in conjunction with serum values Performed at [facility]    Fluid Type-FCRE JP DRAINAGE   Glucose, capillary     Status: Abnormal   Collection Time: 11/25/14  7:26 AM  Result Value Ref Range   Glucose-Capillary 143 (H) 70 - 99 mg/dL    Imaging / Studies: No results found.  Medications / Allergies: per chart  Antibiotics: Anti-infectives    Start     Dose/Rate Route Frequency Ordered Stop   11/25/14 0000  clindamycin (CLEOCIN) 300 MG capsule     300 mg Oral 2 times daily 11/25/14 0957     11/23/14 1200  clindamycin (CLEOCIN) capsule 300 mg     300 mg Oral 2 times daily 11/23/14 1003     11/19/14 2200  clindamycin (CLEOCIN) IVPB 900 mg     900 mg 100 mL/hr over 30 Minutes Intravenous 3 times per day 11/19/14 1902 11/19/14 2217   11/18/14 1600  clindamycin (CLEOCIN) 900 mg, gentamicin (GARAMYCIN) 240 mg in sodium chloride 0.9 % 1,000 mL for intraperitoneal lavage    Comments:  Pharmacy may adjust dosing strength, schedule, rate of infusion, etc as needed to optimize therapy    Intraperitoneal To Surgery 11/18/14 1559 11/19/14 1015   11/18/14 1559  clindamycin (CLEOCIN) IVPB 900 mg  Status:  Discontinued     900 mg 100 mL/hr over 30 Minutes Intravenous 60 min pre-op 11/18/14 1559 11/20/14  4758   11/18/14 1559  gentamicin (GARAMYCIN) 560 mg in dextrose 5 % 100 mL IVPB     5 mg/kg  111.6 kg 114 mL/hr over 60 Minutes Intravenous 60 min pre-op 11/18/14 1559 11/19/14 1030       Note: Portions of this report may have been transcribed using voice recognition software. Every effort was made to ensure accuracy; however, inadvertent computerized transcription errors may be present.   Any transcriptional errors that result from this process are unintentional.     [name], M.D.,  F.A.C.S. Gastrointestinal and Minimally Invasive Surgery [facility] [address] [place], [place] [address] [phone] Main / Paging   11/25/2014

## 2014-11-26 ENCOUNTER — Encounter: Payer: Self-pay | Admitting: Hematology and Oncology

## 2014-11-26 ENCOUNTER — Other Ambulatory Visit: Payer: Self-pay | Admitting: Hematology and Oncology

## 2014-11-26 DIAGNOSIS — D61818 Other pancytopenia: Secondary | ICD-10-CM | POA: Insufficient documentation

## 2014-11-26 DIAGNOSIS — D630 Anemia in neoplastic disease: Secondary | ICD-10-CM

## 2014-11-26 DIAGNOSIS — D709 Neutropenia, unspecified: Secondary | ICD-10-CM

## 2014-11-27 ENCOUNTER — Other Ambulatory Visit: Payer: Self-pay | Admitting: Gynecologic Oncology

## 2014-11-27 DIAGNOSIS — R1032 Left lower quadrant pain: Secondary | ICD-10-CM

## 2014-11-27 MED ORDER — OXYCODONE HCL 5 MG PO TABS: 5.0000 mg | ORAL_TABLET | ORAL | Status: DC | PRN

## 2014-11-27 NOTE — Progress Notes (Signed)
Patient called requesting refill on pain medication.  Reporting sharp left lower flank pain around a 10 last pm with pain dropping to an 8 on the pain scale after taking two oxycodone.  The pain has improved some this am but she is holding off on taking pain medication when needed because she does not want to run out.  Yesterday her foley drained 1300 cc with a small amount of sediment present in amber to yellow urine.  Her appetite was poor yesterday after eating a good breakfast but denies nausea or emesis.  After breakfast, she only ate a few bites of mac and cheese, one slice of pizza, and a small piece of pound cake.  Able to eat cereal, yogurt, and an orange this am.  "Liquidy" output from the colostomy this am with "not really any gas in the bag."  "Good output with gas" reported yesterday.  She states she has been pushing fluids but "feels hematologically dry."  She states she can feel her pulse bounding when she is up and her head throbs intermittently.  [place] RN to come to her house today at 1 pm.  She has been taking two oxycodone every four to six hours as needed for severe pain.  Refill given for oxycodone 5 mg one to two tablets every four to six hours as needed for severe pain, #30.  She is to follow up in the office on Friday.  She is advised to call the office this afternoon with an update and to call if her abdominal pain worsens or for change in colostomy, foley output.

## 2014-11-29 ENCOUNTER — Encounter: Payer: Self-pay | Admitting: Hematology and Oncology

## 2014-11-29 ENCOUNTER — Ambulatory Visit: Payer: BLUE CROSS/BLUE SHIELD | Attending: Gynecologic Oncology | Admitting: Gynecologic Oncology

## 2014-11-29 ENCOUNTER — Encounter: Payer: Self-pay | Admitting: Gynecologic Oncology

## 2014-11-29 ENCOUNTER — Ambulatory Visit (HOSPITAL_BASED_OUTPATIENT_CLINIC_OR_DEPARTMENT_OTHER): Payer: BLUE CROSS/BLUE SHIELD

## 2014-11-29 ENCOUNTER — Telehealth: Payer: Self-pay | Admitting: Hematology and Oncology

## 2014-11-29 ENCOUNTER — Other Ambulatory Visit: Payer: Self-pay | Admitting: *Deleted

## 2014-11-29 ENCOUNTER — Ambulatory Visit (HOSPITAL_BASED_OUTPATIENT_CLINIC_OR_DEPARTMENT_OTHER): Payer: BLUE CROSS/BLUE SHIELD | Admitting: Hematology and Oncology

## 2014-11-29 ENCOUNTER — Other Ambulatory Visit (HOSPITAL_BASED_OUTPATIENT_CLINIC_OR_DEPARTMENT_OTHER): Payer: BLUE CROSS/BLUE SHIELD

## 2014-11-29 VITALS — BP 126/45 | HR 97 | Temp 99.1°F | Resp 20 | Ht 62.0 in | Wt 254.1 lb

## 2014-11-29 VITALS — BP 136/60 | HR 94 | Temp 98.2°F | Resp 18 | Ht 62.0 in | Wt 252.8 lb

## 2014-11-29 DIAGNOSIS — L03311 Cellulitis of abdominal wall: Secondary | ICD-10-CM | POA: Diagnosis not present

## 2014-11-29 DIAGNOSIS — R7989 Other specified abnormal findings of blood chemistry: Secondary | ICD-10-CM | POA: Diagnosis not present

## 2014-11-29 DIAGNOSIS — D708 Other neutropenia: Secondary | ICD-10-CM

## 2014-11-29 DIAGNOSIS — D630 Anemia in neoplastic disease: Secondary | ICD-10-CM | POA: Diagnosis not present

## 2014-11-29 DIAGNOSIS — D709 Neutropenia, unspecified: Secondary | ICD-10-CM

## 2014-11-29 DIAGNOSIS — T814XXD Infection following a procedure, subsequent encounter: Secondary | ICD-10-CM

## 2014-11-29 DIAGNOSIS — IMO0002 Reserved for concepts with insufficient information to code with codable children: Secondary | ICD-10-CM

## 2014-11-29 DIAGNOSIS — Z933 Colostomy status: Secondary | ICD-10-CM

## 2014-11-29 DIAGNOSIS — Z8543 Personal history of malignant neoplasm of ovary: Secondary | ICD-10-CM | POA: Diagnosis not present

## 2014-11-29 DIAGNOSIS — IMO0001 Reserved for inherently not codable concepts without codable children: Secondary | ICD-10-CM

## 2014-11-29 DIAGNOSIS — D75838 Other thrombocytosis: Secondary | ICD-10-CM

## 2014-11-29 LAB — COMPREHENSIVE METABOLIC PANEL (CC13)
ALT: 17 U/L (ref 0–55)
AST: 15 U/L (ref 5–34)
Albumin: 2.6 g/dL — ABNORMAL LOW (ref 3.5–5.0)
Alkaline Phosphatase: 77 U/L (ref 40–150)
Anion Gap: 10 mEq/L (ref 3–11)
BILIRUBIN TOTAL: 0.34 mg/dL (ref 0.20–1.20)
BUN: 11.2 mg/dL (ref 7.0–26.0)
CALCIUM: 9.3 mg/dL (ref 8.4–10.4)
CO2: 30 mEq/L — ABNORMAL HIGH (ref 22–29)
Chloride: 100 mEq/L (ref 98–109)
Creatinine: 0.7 mg/dL (ref 0.6–1.1)
EGFR: 88 mL/min/{1.73_m2} — AB (ref >90)
Glucose: 168 mg/dl — ABNORMAL HIGH (ref 70–140)
Potassium: 3.5 mEq/L (ref 3.5–5.1)
SODIUM: 140 meq/L (ref 136–145)
Total Protein: 6.4 g/dL (ref 6.4–8.3)

## 2014-11-29 LAB — CBC & DIFF AND RETIC
BASO%: 1.4 % (ref 0.0–2.0)
Basophils Absolute: 0 10*3/uL (ref 0.0–0.1)
EOS ABS: 0.2 10*3/uL (ref 0.0–0.5)
EOS%: 7 % (ref 0.0–7.0)
HCT: 23.4 % — ABNORMAL LOW (ref 34.8–46.6)
HGB: 7.2 g/dL — ABNORMAL LOW (ref 11.6–15.9)
IMMATURE RETIC FRACT: 19.8 % — AB (ref 1.60–10.00)
LYMPH%: 27.7 % (ref 14.0–49.7)
MCH: 27.1 pg (ref 25.1–34.0)
MCHC: 30.8 g/dL — ABNORMAL LOW (ref 31.5–36.0)
MCV: 88 fL (ref 79.5–101.0)
MONO#: 0.8 10*3/uL (ref 0.1–0.9)
MONO%: 39 % — AB (ref 0.0–14.0)
NEUT%: 24.9 % — ABNORMAL LOW (ref 38.4–76.8)
NEUTROS ABS: 0.5 10*3/uL — AB (ref 1.5–6.5)
Platelets: 548 10*3/uL — ABNORMAL HIGH (ref 145–400)
RBC: 2.66 10*6/uL — ABNORMAL LOW (ref 3.70–5.45)
RDW: 17.5 % — ABNORMAL HIGH (ref 11.2–14.5)
RETIC %: 5.65 % — AB (ref 0.70–2.10)
RETIC CT ABS: 150.29 10*3/uL — AB (ref 33.70–90.70)
WBC: 2.1 10*3/uL — ABNORMAL LOW (ref 3.9–10.3)
lymph#: 0.6 10*3/uL — ABNORMAL LOW (ref 0.9–3.3)
nRBC: 5 % — ABNORMAL HIGH (ref 0–0)

## 2014-11-29 LAB — HOLD TUBE, BLOOD BANK

## 2014-11-29 LAB — IRON AND TIBC CHCC
%SAT: 4 % — ABNORMAL LOW (ref 21–57)
Iron: 12 ug/dL — ABNORMAL LOW (ref 41–142)
TIBC: 331 ug/dL (ref 236–444)
UIBC: 319 ug/dL (ref 120–384)

## 2014-11-29 LAB — FERRITIN CHCC: Ferritin: 27 ng/ml (ref 9–269)

## 2014-11-29 MED ORDER — LEVOFLOXACIN 500 MG PO TABS: 500.0000 mg | ORAL_TABLET | Freq: Every day | ORAL | Status: DC

## 2014-11-29 MED ORDER — TBO-FILGRASTIM 480 MCG/0.8ML ~~LOC~~ SOSY
480.0000 ug | PREFILLED_SYRINGE | SUBCUTANEOUS | Status: DC
  Administered 2014-11-29: 480 ug via SUBCUTANEOUS
  Filled 2014-11-29 (×2): qty 0.8

## 2014-11-29 NOTE — Progress Notes (Signed)
POSTOPERATIVE FOLLOWUP VISIT  Assessment:    59 y.o. year old with recurrent endometrioid endometrial/ovarian cancer.   S/p exploratory laparotomy, posterior supralevator pelvic exenteration, end colostomy, bladder repair, ureteral stenting on 11/09/14. Now with surgical site infection peri-stomal   Plan: 1) Pathology reports reviewed today 2) Treatment counseling - She has negative margins on her specimen. We are recommending adjuvant radiation to consolidate pelvic control and reduce risks for local recurrence. 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 She was given the opportunity to ask questions, which were answered to her satisfaction, and she is agreement with the above mentioned plan of care. We would plan on initiating radiation no sooner than 6 weeks postop or after complete healing of bladder. 3) postoperative anemia - stable, on FeSO4. Asymptomatic 4) neutropenia - Dr Alvy Bimler is managing this and we appreciate. I believe she is a candidate for more Granix today. 5) peristomal cellulitis- prescribed Levaquin. Marked out area on abdomen. Patient to call if it extends, in which case she will require admission for IV antibiotics. 6)  Return to clinic 2 weeks for retrograde cystogram and removal of foley if negative for leak. Ureteral stents to be removed in 4 weeks if CT urogram shows no stricture. Checking JP creatinine today - if same as serum levels, JP can be removed. [name] Eva, MD   HPI:  [name] is a 59 y.o. 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  She then underwent a posterior  supralevator exenteration with colostomy and bladder repair and stent placement on [id number] without complications.  Her postoperative course was uncomplicate with the exception of development of postop anemia.  Her final pathology revealed endometrioid adenocarcinoma invading the vagina and rectum with negative margins on the specimen. The margin had been close (clinically) to the right pelvic sidewall which was m.  She is seen today for a postoperative check and to discuss her pathology results and ongoing plan.  Since discharge from the hospital, she is feeling generally well.  She has improving appetite, functioning stoma, and pain controlled with minimal PO medication. She has had increasing temp's particularly at night with a 100.0 temp last night (others in the 99's). She notices increased redness and discomfort around her stoma in past 48 hours and hardness underlying it.   Review of systems: Constitutional:  She has no weight gain or weight loss. She has a low grade fever no chills. Eyes: No blurred vision Ears, Nose, Mouth, Throat: No dizziness, headaches or changes in hearing. No mouth sores. Cardiovascular: No chest pain, palpitations or edema. Respiratory:  No shortness of breath, wheezing or cough Gastrointestinal: She has normal bowel movements trhough stoma without diarrhea or constipation. She denies any nausea or vomiting. She denies blood in her stool or heart burn. Genitourinary:  She denies pelvic pain, pelvic pressure or changes in her urinary function. She has no hematuria, dysuria, or incontinence (foley in situ). She has no irregular vaginal bleeding or vaginal discharge Musculoskeletal: Denies muscle weakness or joint pains.  Skin:  She has no skin changes, rashes or itching Neurological:  Denies dizziness or headaches. No neuropathy, no numbness or tingling. Psychiatric:  She denies depression or anxiety. Hematologic/Lymphatic:   No easy bruising or  bleeding   Physical  Exam: Blood pressure 136/60, pulse 94, temperature 98.2 F (36.8 C), temperature source Oral, resp. rate 18, height 5\' 2"  (1.575 m), weight 252 lb 12.8 oz (114.669 kg). General: Well dressed, well nourished in no apparent distress.   HEENT:  Normocephalic and atraumatic, no lesions.  Extraocular muscles intact. Sclerae anicteric. Pupils equal, round, reactive. No mouth sores or ulcers. Thyroid is normal size, not nodular, midline. Skin:  No lesions or rashes. Breasts:  deferred Lungs:  Clear to auscultation bilaterally.  No wheezes. Cardiovascular:  Regular rate and rhythm.  No murmurs or rubs. Abdomen:  Soft, nontender, nondistended.  No palpable masses.  No hepatosplenomegaly.  No ascites. Normal bowel sounds.  No hernias.  Incision is healed - staples removed. No drainage. To the right of the colostomy is blanching erythema and underlying firmness of the subQ tissue, consistent with cellulitis. Marked edge of erythema. Genitourinary: deferred Extremities: No cyanosis, clubbing or edema.  No calf tenderness or erythema. No palpable cords. Psychiatric: Mood and affect are appropriate. Neurological: Awake, alert and oriented x 3. Sensation is intact, no neuropathy.  Musculoskeletal: No pain, normal strength and range of motion.  [name] Eva, MD

## 2014-11-29 NOTE — Assessment & Plan Note (Signed)
She has very minor superficial skin infection around her surgical site. She is on at appropriate antibiotics. We will proceed with G-CSF injection today and she will continue on injection on a weekly basis to prevent life-threatening infection.

## 2014-11-29 NOTE — Assessment & Plan Note (Addendum)
The patient responded well to G-CSF. There is no role for IVIG. I recommend she treat returns on a weekly basis for G-CSF injection to keep her [place] greater than 1500. She is concerned about the need for adjuvant treatment for recurrent cancer. Since she has responded so well to G-CSF injection, I do not think this should not be a contraindication for her to receive adjuvant chemotherapy.

## 2014-11-29 NOTE — Assessment & Plan Note (Signed)
This is multifactorial, related to recent postoperative blood loss and mild anemia chronic disease. She is responding well with great reticulocytosis with oral iron supplement. I recommend she continues to same for the next few months I saw she can tolerate oral iron supplement well. If she cannot tolerate that further, I can also prescribe intravenous iron infusion.

## 2014-11-29 NOTE — Assessment & Plan Note (Signed)
She is recovering well from recent surgical resection. I will defer to her gynecologist oncologist to determine the best course of adjuvant treatment for her.

## 2014-11-29 NOTE — Telephone Encounter (Signed)
Pt confirmed labs/ov/inj per 04/29 POF, gave pt AVS and Calendar.... KJ,

## 2014-11-29 NOTE — Patient Instructions (Addendum)
We will contact you with the results of your creatinine level from your JP drain to see about having the drain removed the beginning of next week.  Begin Levaquin daily for 14 days per Dr. Serita Grit recommendations for probable infection around the stoma site.  We have you scheduled for a cystogram to evaluate your bladder for leakage on May 12 and will plan to see you in the office on May 13 for possible foley removal.

## 2014-11-29 NOTE — Assessment & Plan Note (Signed)
She has signs of reactive thrombocytosis to recent postsurgical blood loss, mild iron deficiency and recent mild infection around the stoma. I recommend observation only and to continue on oral iron supplement

## 2014-11-29 NOTE — Progress Notes (Signed)
[name] OFFICE PROGRESS NOTE  MATTHEWS,MICHELLE A., MD SUMMARY OF HEMATOLOGIC HISTORY:  She is a retired [employer] scientist with chronic idiopathic neutropenia presumed related to previous chemotherapy for 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 Going to the patient, initial diagnosis with ovarian cancer was when she presented with abdomen and no swelling and pelvic pain leading to her urgent care visit. She states that the tumor had caused torsion of the ovary. She cannot remember the stage of her disease. She began to develop leukopenia in approximately July of 2010. She underwent an initial bone marrow biopsy at [facility] in [place] on 03/17/2009. but it was nondiagnostic. No gross dysplastic changes, no excess blasts, normal cytogenetics.  Her counts have been stable over observation through this office since December 2010 with some minor fluctuations. Total white counts run as low as 1800 and as high as 2900. Percent neutrophils fluctuates very widely from as low as 4% recorded on a CBC done 02/03/2010 to as high as 31% with average being 20%. She was hospitalized in the ICU at that time  Both hemoglobin and platelets have remained stable over time.  Despite leukopenia, she has not had any recurrent or severe infections. She stated strong family history of lupus in a cousin. She complained of occasional skin rash and joint stiffness. 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 She received G-CSF in January of 2015 to stimulate white blood cell production around a colonoscopy procedure a few years ago. She had a limited response. On 01/23/2014, repeat bone marrow biopsy excluded lymphoma or leukemia. On 09/12/2014, CT scan of the abdomen and pelvis show possible relapse of gynecological Malignancy On 10/14/2014, MRI of the  pelvis show significant pelvic mass without invasion into the bladder but abutting to the rectum On 11/19/2014, she underwent surgery and had robotic-assisted lysis of adhesions, converted to laparotomy, radical upper vaginectomy and low anterior resection with colostomy. Bilateral ureteral stent placement and cystotomy repair. Pathology Accession: [id number] showed recurrent endometrioid carcinoma with squamous differentiation involving the colonic mucosa and vagina mucosa. Resection margins were negative. She was hospitalized from 11/19/2014 to 11/25/2014.  INTERVAL HISTORY: [name] Los 59 y.o. female returns for further follow-up. She has very minor skin infection around first all while an surgical incision site. She is placed on antibiotic therapy.  Her pain appears to be well controlled. She complained of fatigue but able to ambulate without significant difficulties. She has been placed oral iron supplement and has been taking that twice a day. She experienced mild stomach discomfort but it does not prohibit her from continuing oral iron supplement. The patient denies any recent signs or symptoms of bleeding such as spontaneous epistaxis, hematuria or hematochezia. She denies fevers or chills.  I have reviewed the past medical history, past surgical history, social history and family history with the patient and they are unchanged from previous note.  ALLERGIES:  is allergic to penicillins; adhesive; cefaclor; erythromycin; trimethoprim; and sulfa antibiotics.  MEDICATIONS:  Current Outpatient Prescriptions  Medication Sig Dispense Refill  . acetaminophen (TYLENOL) 500 MG tablet Take 2 tablets (1,000 mg total) by mouth 3 (three) times daily. 30 tablet 0  . Calcium Carbonate-Vitamin D (CALCIUM + D PO) Take 2 tablets by mouth daily.    . Canagliflozin (INVOKANA) 300 MG TABS Take 1 tablet by mouth every morning.     . Cholecalciferol (VITAMIN D3) 10000  UNITS capsule Take 10,000 Units by  mouth once a week. Sundays    . clindamycin (CLEOCIN) 300 MG capsule Take 1 capsule (300 mg total) by mouth 2 (two) times daily. 10 capsule 1  . enoxaparin (LOVENOX) 40 MG/0.4ML injection Inject 0.4 mLs (40 mg total) into the skin daily. 21 Syringe 1  . ferrous sulfate 325 (65 FE) MG tablet Take 1 tablet (325 mg total) by mouth 2 (two) times daily with a meal. 60 tablet 3  . levothyroxine (SYNTHROID) 175 MCG tablet Take 175 mcg by mouth daily before breakfast.    . metFORMIN (GLUCOPHAGE) 1000 MG tablet Take 1,000 mg by mouth 2 (two) times daily with a meal.     . Multiple Vitamin (MULTIVITAMIN WITH MINERALS) TABS tablet Take 1 tablet by mouth daily.    [name] Kitchen omega-3 acid ethyl esters (LOVAZA) 1 G capsule Take 1 g by mouth 2 (two) times daily.    [name] Kitchen omeprazole (PRILOSEC) 20 MG capsule Take 20 mg by mouth daily.    . ondansetron (ZOFRAN) 4 MG tablet Take 1 tablet (4 mg total) by mouth every 6 (six) hours as needed for nausea. 20 tablet 0  . ONETOUCH VERIO test strip     . oxyCODONE (OXY IR/ROXICODONE) 5 MG immediate release tablet Take 1-2 tablets (5-10 mg total) by mouth every 4 (four) hours as needed for moderate pain, severe pain or breakthrough pain. 30 tablet 0  . rosuvastatin (CRESTOR) 10 MG tablet Take 10 mg by mouth every evening.     . sitaGLIPtin (JANUVIA) 100 MG tablet Take 100 mg by mouth daily.    . Tbo-Filgrastim (GRANIX) 480 MCG/0.8ML SOSY injection Inject 480 mcg into the skin once a week.    . docusate sodium (COLACE) 100 MG capsule Take 100 mg by mouth daily.    . fluconazole (DIFLUCAN) 100 MG tablet Take 1 tablet (100 mg total) by mouth once. May repeat prn (Patient not taking: Reported on 11/11/2014) 2 tablet 0  . hydrochlorothiazide (HYDRODIURIL) 12.5 MG tablet Take 12.5 mg by mouth every morning.     [name] Kitchen ibuprofen (ADVIL,MOTRIN) 800 MG tablet     . levofloxacin (LEVAQUIN) 500 MG tablet Take 1 tablet (500 mg total) by mouth daily. (Patient not taking: Reported on 11/29/2014) 14 tablet 1   . naproxen sodium (ALEVE) 220 MG tablet Take 220 mg by mouth every 12 (twelve) hours.     [name] Kitchen oxyCODONE-acetaminophen (PERCOCET/ROXICET) 5-325 MG per tablet Take 1-2 tablets by mouth every 4 (four) hours as needed for severe pain. (Patient not taking: Reported on 11/29/2014) 60 tablet 0  . Polyethyl Glycol-Propyl Glycol (SYSTANE OP) Apply 1 drop to eye daily as needed (Dry eyes).     . valsartan (DIOVAN) 320 MG tablet Take 320 mg by mouth every morning.     No current facility-administered medications for this visit.   Facility-Administered Medications Ordered in Other Visits  Medication Dose Route Frequency Provider Last Rate Last Dose  . Tbo-Filgrastim (GRANIX) injection 480 mcg  480 mcg Subcutaneous Weekly Heath Lark, MD   480 mcg at 11/29/14 1338     REVIEW OF SYSTEMS:   Constitutional: Denies fevers, chills or night sweats Eyes: Denies blurriness of vision Ears, nose, mouth, throat, and face: Denies mucositis or sore throat Respiratory: Denies cough, dyspnea or wheezes Cardiovascular: Denies palpitation, chest discomfort or lower extremity swelling Gastrointestinal:  Denies nausea, heartburn or change in bowel habits Lymphatics: Denies new lymphadenopathy or easy bruising Neurological:Denies numbness, tingling or new weaknesses Behavioral/Psych:  Mood is stable, no new changes  All other systems were reviewed with the patient and are negative.  PHYSICAL EXAMINATION: ECOG PERFORMANCE STATUS: 1 - Symptomatic but completely ambulatory  Filed Vitals:   11/29/14 1309  BP: 126/45  Pulse: 97  Temp: 99.1 F (37.3 C)  Resp: 20   Filed Weights   11/29/14 1309  Weight: 254 lb 1.6 oz (115.259 kg)    GENERAL:alert, no distress and comfortable. She is obese and pale SKIN: Minor erythema is noted around the surgical incision site and around the stoma site. No significant cellulitis is appreciated. EYES: normal, Conjunctiva are pale and non-injected, sclera clear OROPHARYNX:no exudate,  no erythema and lips, buccal mucosa, and tongue normal  ABDOMEN:abdomen soft, non-tender and normal bowel sounds. Stoma appears to be functioning well Musculoskeletal:no cyanosis of digits and no clubbing  NEURO: alert & oriented x 3 with fluent speech, no focal motor/sensory deficits  LABORATORY DATA:  I have reviewed the data as listed Results for orders placed or performed in visit on 11/29/14 (from the past 48 hour(s))  CBC & Diff and Retic     Status: Abnormal   Collection Time: 11/29/14 12:51 PM  Result Value Ref Range   WBC 2.1 (L) 3.9 - 10.3 10e3/uL   NEUT# 0.5 (LL) 1.5 - 6.5 10e3/uL   HGB 7.2 (L) 11.6 - 15.9 g/dL   HCT 23.4 (L) 34.8 - 46.6 %   Platelets 548 (H) 145 - 400 10e3/uL   MCV 88.0 79.5 - 101.0 fL   MCH 27.1 25.1 - 34.0 pg   MCHC 30.8 (L) 31.5 - 36.0 g/dL   RBC 2.66 (L) 3.70 - 5.45 10e6/uL   RDW 17.5 (H) 11.2 - 14.5 %   lymph# 0.6 (L) 0.9 - 3.3 10e3/uL   MONO# 0.8 0.1 - 0.9 10e3/uL   Eosinophils Absolute 0.2 0.0 - 0.5 10e3/uL   Basophils Absolute 0.0 0.0 - 0.1 10e3/uL   NEUT% 24.9 (L) 38.4 - 76.8 %   LYMPH% 27.7 14.0 - 49.7 %   MONO% 39.0 (H) 0.0 - 14.0 %   EOS% 7.0 0.0 - 7.0 %   BASO% 1.4 0.0 - 2.0 %   nRBC 5 (H) 0 - 0 %   Retic % 5.65 (H) 0.70 - 2.10 %   Retic Ct Abs 150.29 (H) 33.70 - 90.70 10e3/uL   Immature Retic Fract 19.80 (H) 1.60 - 10.00 %  Comprehensive metabolic panel     Status: Abnormal   Collection Time: 11/29/14 12:51 PM  Result Value Ref Range   Sodium 140 136 - 145 mEq/L   Potassium 3.5 3.5 - 5.1 mEq/L   Chloride 100 98 - 109 mEq/L   CO2 30 (H) 22 - 29 mEq/L   Glucose 168 (H) 70 - 140 mg/dl   BUN 11.2 7.0 - 26.0 mg/dL   Creatinine 0.7 0.6 - 1.1 mg/dL   Total Bilirubin 0.34 0.20 - 1.20 mg/dL   Alkaline Phosphatase 77 40 - 150 U/L   AST 15 5 - 34 U/L   ALT 17 0 - 55 U/L   Total Protein 6.4 6.4 - 8.3 g/dL   Albumin 2.6 (L) 3.5 - 5.0 g/dL   Calcium 9.3 8.4 - 10.4 mg/dL   Anion Gap 10 3 - 11 mEq/L   EGFR 88 (L) >90 ml/min/1.73 m2     Comment: eGFR is calculated using the CKD-EPI Creatinine Equation (2009)  Iron and TIBC     Status: Abnormal   Collection Time: 11/29/14 12:51  PM  Result Value Ref Range   Iron 12 (L) 41 - 142 ug/dL   TIBC 331 236 - 444 ug/dL   UIBC 319 120 - 384 ug/dL   %SAT 4 (L) 21 - 57 %  Ferritin     Status: None   Collection Time: 11/29/14 12:51 PM  Result Value Ref Range   Ferritin 27 9 - 269 ng/ml  Hold Tube, Blood Bank     Status: None   Collection Time: 11/29/14 12:51 PM  Result Value Ref Range   Hold Tube, Blood Bank Blood Bank Order Cancelled     Lab Results  Component Value Date   WBC 2.1* 11/29/2014   HGB 7.2* 11/29/2014   HCT 23.4* 11/29/2014   MCV 88.0 11/29/2014   PLT 548* 11/29/2014    ASSESSMENT & PLAN:  Chronic neutropenia The patient responded well to G-CSF. There is no role for IVIG. I recommend she treat returns on a weekly basis for G-CSF injection to keep her [place] greater than 1500. She is concerned about the need for adjuvant treatment for recurrent cancer. Since she has responded so well to G-CSF injection, I do not think this should not be a contraindication for her to receive adjuvant chemotherapy.   History of ovarian cancer She is recovering well from recent surgical resection. I will defer to her gynecologist oncologist to determine the best course of adjuvant treatment for her.   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 She is responding well with great reticulocytosis with oral iron supplement. I recommend she continues to same for the next few months I saw she can tolerate oral iron supplement well. If she cannot tolerate that further, I can also prescribe intravenous iron infusion.   Reactive thrombocytosis She has signs of reactive thrombocytosis to recent postsurgical blood loss, mild iron deficiency and recent mild infection around the stoma. I recommend  observation only and to continue on oral iron supplement   Superficial incisional infection of surgical site She has very minor superficial skin infection around her surgical site. She is on at appropriate antibiotics. We will proceed with G-CSF injection today and she will continue on injection on a weekly basis to prevent life-threatening infection.    All questions were answered. The patient knows to call the clinic with any problems, questions or concerns. No barriers to learning was detected.  I spent 25 minutes counseling the patient face to face. The total time spent in the appointment was 30 minutes and more than 50% was on counseling.     [facility], Ardel Jagger, MD 4/29/20164:36 PM

## 2014-12-02 ENCOUNTER — Inpatient Hospital Stay (HOSPITAL_COMMUNITY)
Admission: AD | Admit: 2014-12-02 | Discharge: 2014-12-12 | DRG: 982 | Disposition: A | Discharge status: Home Health | Payer: BLUE CROSS/BLUE SHIELD | Source: Ambulatory Visit | Attending: Gynecologic Oncology | Admitting: Gynecologic Oncology

## 2014-12-02 ENCOUNTER — Telehealth: Payer: Self-pay | Admitting: *Deleted

## 2014-12-02 ENCOUNTER — Ambulatory Visit (HOSPITAL_BASED_OUTPATIENT_CLINIC_OR_DEPARTMENT_OTHER): Payer: BLUE CROSS/BLUE SHIELD | Admitting: Gynecologic Oncology

## 2014-12-02 ENCOUNTER — Encounter: Payer: Self-pay | Admitting: Gynecologic Oncology

## 2014-12-02 ENCOUNTER — Inpatient Hospital Stay (HOSPITAL_COMMUNITY): Payer: BLUE CROSS/BLUE SHIELD

## 2014-12-02 ENCOUNTER — Encounter (HOSPITAL_COMMUNITY): Payer: Self-pay | Admitting: *Deleted

## 2014-12-02 DIAGNOSIS — Z880 Allergy status to penicillin: Secondary | ICD-10-CM

## 2014-12-02 DIAGNOSIS — L03311 Cellulitis of abdominal wall: Secondary | ICD-10-CM | POA: Diagnosis not present

## 2014-12-02 DIAGNOSIS — K660 Peritoneal adhesions (postprocedural) (postinfection): Secondary | ICD-10-CM | POA: Diagnosis present

## 2014-12-02 DIAGNOSIS — IMO0001 Reserved for inherently not codable concepts without codable children: Secondary | ICD-10-CM

## 2014-12-02 DIAGNOSIS — C541 Malignant neoplasm of endometrium: Secondary | ICD-10-CM | POA: Diagnosis present

## 2014-12-02 DIAGNOSIS — Z79899 Other long term (current) drug therapy: Secondary | ICD-10-CM | POA: Diagnosis not present

## 2014-12-02 DIAGNOSIS — D630 Anemia in neoplastic disease: Secondary | ICD-10-CM

## 2014-12-02 DIAGNOSIS — E860 Dehydration: Secondary | ICD-10-CM | POA: Diagnosis present

## 2014-12-02 DIAGNOSIS — Z6841 Body Mass Index (BMI) 40.0 and over, adult: Secondary | ICD-10-CM | POA: Diagnosis not present

## 2014-12-02 DIAGNOSIS — R1032 Left lower quadrant pain: Secondary | ICD-10-CM

## 2014-12-02 DIAGNOSIS — Z791 Long term (current) use of non-steroidal anti-inflammatories (NSAID): Secondary | ICD-10-CM | POA: Diagnosis not present

## 2014-12-02 DIAGNOSIS — Z8249 Family history of ischemic heart disease and other diseases of the circulatory system: Secondary | ICD-10-CM | POA: Diagnosis not present

## 2014-12-02 DIAGNOSIS — Z9221 Personal history of antineoplastic chemotherapy: Secondary | ICD-10-CM | POA: Diagnosis not present

## 2014-12-02 DIAGNOSIS — Z8542 Personal history of malignant neoplasm of other parts of uterus: Secondary | ICD-10-CM

## 2014-12-02 DIAGNOSIS — E119 Type 2 diabetes mellitus without complications: Secondary | ICD-10-CM | POA: Diagnosis present

## 2014-12-02 DIAGNOSIS — E039 Hypothyroidism, unspecified: Secondary | ICD-10-CM | POA: Diagnosis present

## 2014-12-02 DIAGNOSIS — Z833 Family history of diabetes mellitus: Secondary | ICD-10-CM

## 2014-12-02 DIAGNOSIS — Z9079 Acquired absence of other genital organ(s): Secondary | ICD-10-CM | POA: Insufficient documentation

## 2014-12-02 DIAGNOSIS — K9402 Colostomy infection: Secondary | ICD-10-CM

## 2014-12-02 DIAGNOSIS — I10 Essential (primary) hypertension: Secondary | ICD-10-CM | POA: Diagnosis present

## 2014-12-02 DIAGNOSIS — D500 Iron deficiency anemia secondary to blood loss (chronic): Secondary | ICD-10-CM | POA: Diagnosis not present

## 2014-12-02 DIAGNOSIS — M7989 Other specified soft tissue disorders: Secondary | ICD-10-CM | POA: Diagnosis not present

## 2014-12-02 DIAGNOSIS — Z79891 Long term (current) use of opiate analgesic: Secondary | ICD-10-CM | POA: Diagnosis not present

## 2014-12-02 DIAGNOSIS — D709 Neutropenia, unspecified: Secondary | ICD-10-CM | POA: Diagnosis present

## 2014-12-02 DIAGNOSIS — D473 Essential (hemorrhagic) thrombocythemia: Secondary | ICD-10-CM | POA: Diagnosis present

## 2014-12-02 DIAGNOSIS — Y838 Other surgical procedures as the cause of abnormal reaction of the patient, or of later complication, without mention of misadventure at the time of the procedure: Secondary | ICD-10-CM | POA: Diagnosis present

## 2014-12-02 DIAGNOSIS — Z433 Encounter for attention to colostomy: Secondary | ICD-10-CM

## 2014-12-02 DIAGNOSIS — Z800 Family history of malignant neoplasm of digestive organs: Secondary | ICD-10-CM | POA: Diagnosis not present

## 2014-12-02 DIAGNOSIS — Z90710 Acquired absence of both cervix and uterus: Secondary | ICD-10-CM

## 2014-12-02 DIAGNOSIS — Z933 Colostomy status: Secondary | ICD-10-CM | POA: Diagnosis not present

## 2014-12-02 DIAGNOSIS — E873 Alkalosis: Secondary | ICD-10-CM | POA: Diagnosis present

## 2014-12-02 DIAGNOSIS — Z881 Allergy status to other antibiotic agents status: Secondary | ICD-10-CM

## 2014-12-02 DIAGNOSIS — T814XXA Infection following a procedure, initial encounter: Principal | ICD-10-CM

## 2014-12-02 DIAGNOSIS — K567 Ileus, unspecified: Secondary | ICD-10-CM | POA: Diagnosis not present

## 2014-12-02 DIAGNOSIS — Z882 Allergy status to sulfonamides status: Secondary | ICD-10-CM

## 2014-12-02 DIAGNOSIS — Z8543 Personal history of malignant neoplasm of ovary: Secondary | ICD-10-CM | POA: Diagnosis not present

## 2014-12-02 DIAGNOSIS — L02211 Cutaneous abscess of abdominal wall: Secondary | ICD-10-CM | POA: Diagnosis present

## 2014-12-02 DIAGNOSIS — C569 Malignant neoplasm of unspecified ovary: Secondary | ICD-10-CM | POA: Diagnosis present

## 2014-12-02 DIAGNOSIS — R600 Localized edema: Secondary | ICD-10-CM | POA: Diagnosis present

## 2014-12-02 DIAGNOSIS — Z885 Allergy status to narcotic agent status: Secondary | ICD-10-CM | POA: Diagnosis not present

## 2014-12-02 DIAGNOSIS — R609 Edema, unspecified: Secondary | ICD-10-CM | POA: Insufficient documentation

## 2014-12-02 DIAGNOSIS — D62 Acute posthemorrhagic anemia: Secondary | ICD-10-CM | POA: Diagnosis present

## 2014-12-02 DIAGNOSIS — K219 Gastro-esophageal reflux disease without esophagitis: Secondary | ICD-10-CM | POA: Diagnosis present

## 2014-12-02 DIAGNOSIS — E876 Hypokalemia: Secondary | ICD-10-CM | POA: Diagnosis not present

## 2014-12-02 DIAGNOSIS — T8149XA Infection following a procedure, other surgical site, initial encounter: Secondary | ICD-10-CM

## 2014-12-02 DIAGNOSIS — Z9889 Other specified postprocedural states: Secondary | ICD-10-CM

## 2014-12-02 LAB — CBC WITH DIFFERENTIAL/PLATELET
Basophils Absolute: 0.1 10*3/uL (ref 0.0–0.1)
Basophils Relative: 2 % — ABNORMAL HIGH (ref 0–1)
Eosinophils Absolute: 0.3 10*3/uL (ref 0.0–0.7)
Eosinophils Relative: 6 % — ABNORMAL HIGH (ref 0–5)
HCT: 22.7 % — ABNORMAL LOW (ref 36.0–46.0)
HEMOGLOBIN: 6.6 g/dL — AB (ref 12.0–15.0)
LYMPHS ABS: 1.5 10*3/uL (ref 0.7–4.0)
Lymphocytes Relative: 31 % (ref 12–46)
MCH: 25.4 pg — ABNORMAL LOW (ref 26.0–34.0)
MCHC: 29.1 g/dL — ABNORMAL LOW (ref 30.0–36.0)
MCV: 87.3 fL (ref 78.0–100.0)
MONO ABS: 1.2 10*3/uL — AB (ref 0.1–1.0)
Monocytes Relative: 24 % — ABNORMAL HIGH (ref 3–12)
NEUTROS PCT: 37 % — AB (ref 43–77)
Neutro Abs: 1.8 10*3/uL (ref 1.7–7.7)
PLATELETS: 537 10*3/uL — AB (ref 150–400)
RBC: 2.6 MIL/uL — AB (ref 3.87–5.11)
RDW: 17.4 % — ABNORMAL HIGH (ref 11.5–15.5)
WBC: 4.9 10*3/uL (ref 4.0–10.5)

## 2014-12-02 LAB — BASIC METABOLIC PANEL
ANION GAP: 6 (ref 5–15)
BUN: 11 mg/dL (ref 6–20)
CHLORIDE: 103 mmol/L (ref 101–111)
CO2: 29 mmol/L (ref 22–32)
CREATININE: 0.67 mg/dL (ref 0.44–1.00)
Calcium: 9.1 mg/dL (ref 8.9–10.3)
GFR calc non Af Amer: >60 mL/min (ref >60)
Glucose, Bld: 130 mg/dL — ABNORMAL HIGH (ref 70–99)
POTASSIUM: 2.8 mmol/L — AB (ref 3.5–5.1)
SODIUM: 138 mmol/L (ref 135–145)

## 2014-12-02 LAB — GLUCOSE, CAPILLARY: Glucose-Capillary: 106 mg/dL — ABNORMAL HIGH (ref 70–99)

## 2014-12-02 LAB — MAGNESIUM: MAGNESIUM: 1.3 mg/dL — AB (ref 1.7–2.4)

## 2014-12-02 MED ORDER — CIPROFLOXACIN IN D5W 200 MG/100ML IV SOLN: 200.0000 mg | Freq: Two times a day (BID) | INTRAVENOUS | Status: DC

## 2014-12-02 MED ORDER — ACETAMINOPHEN 500 MG PO TABS
1000.0000 mg | ORAL_TABLET | Freq: Three times a day (TID) | ORAL | Status: DC
  Administered 2014-12-02 – 2014-12-06 (×10): 1000 mg via ORAL
  Filled 2014-12-02 (×15): qty 2

## 2014-12-02 MED ORDER — OMEGA-3-ACID ETHYL ESTERS 1 G PO CAPS
1.0000 g | ORAL_CAPSULE | Freq: Two times a day (BID) | ORAL | Status: DC
  Administered 2014-12-02 – 2014-12-12 (×14): 1 g via ORAL
  Filled 2014-12-02 (×22): qty 1

## 2014-12-02 MED ORDER — TRAMADOL HCL 50 MG PO TABS: 50.0000 mg | ORAL_TABLET | Freq: Four times a day (QID) | ORAL | Status: DC | PRN

## 2014-12-02 MED ORDER — SODIUM CHLORIDE 0.9 % IV SOLN: 250.0000 mL | INTRAVENOUS | Status: DC | PRN

## 2014-12-02 MED ORDER — CLINDAMYCIN PHOSPHATE 600 MG/50ML IV SOLN
600.0000 mg | Freq: Four times a day (QID) | INTRAVENOUS | Status: DC
  Administered 2014-12-02 – 2014-12-11 (×34): 600 mg via INTRAVENOUS
  Filled 2014-12-02 (×36): qty 50

## 2014-12-02 MED ORDER — OXYCODONE HCL 5 MG PO TABS
5.0000 mg | ORAL_TABLET | ORAL | Status: DC | PRN
Start: 2014-12-02 — End: 2014-12-12
  Administered 2014-12-10 (×3): 10 mg via ORAL
  Administered 2014-12-10: 5 mg via ORAL
  Administered 2014-12-11 – 2014-12-12 (×6): 10 mg via ORAL
  Filled 2014-12-02 (×11): qty 2

## 2014-12-02 MED ORDER — PHENOL 1.4 % MT LIQD: 2.0000 | OROMUCOSAL | Status: DC | PRN

## 2014-12-02 MED ORDER — METRONIDAZOLE IN NACL 5-0.79 MG/ML-% IV SOLN
500.0000 mg | Freq: Three times a day (TID) | INTRAVENOUS | Status: DC
  Filled 2014-12-02: qty 100

## 2014-12-02 MED ORDER — HYDROMORPHONE HCL 1 MG/ML IJ SOLN
1.0000 mg | INTRAMUSCULAR | Status: DC | PRN
  Administered 2014-12-02 – 2014-12-03 (×2): 2 mg via INTRAVENOUS
  Administered 2014-12-03: 1 mg via INTRAVENOUS
  Administered 2014-12-03: 2 mg via INTRAVENOUS
  Administered 2014-12-03: 1 mg via INTRAVENOUS
  Administered 2014-12-03: 2 mg via INTRAVENOUS
  Administered 2014-12-03: 1 mg via INTRAVENOUS
  Administered 2014-12-03 – 2014-12-04 (×3): 2 mg via INTRAVENOUS
  Filled 2014-12-02 (×2): qty 1
  Filled 2014-12-02 (×8): qty 2
  Filled 2014-12-02: qty 1

## 2014-12-02 MED ORDER — VANCOMYCIN HCL IN DEXTROSE 1-5 GM/200ML-% IV SOLN
1000.0000 mg | Freq: Two times a day (BID) | INTRAVENOUS | Status: DC
  Administered 2014-12-03 – 2014-12-09 (×13): 1000 mg via INTRAVENOUS
  Filled 2014-12-02 (×13): qty 200

## 2014-12-02 MED ORDER — FERROUS SULFATE 325 (65 FE) MG PO TABS
325.0000 mg | ORAL_TABLET | Freq: Two times a day (BID) | ORAL | Status: DC
Start: 2014-12-02 — End: 2014-12-12
  Administered 2014-12-02 – 2014-12-12 (×15): 325 mg via ORAL
  Filled 2014-12-02 (×22): qty 1

## 2014-12-02 MED ORDER — CANAGLIFLOZIN 300 MG PO TABS
300.0000 mg | ORAL_TABLET | Freq: Every morning | ORAL | Status: DC
  Administered 2014-12-03: 300 mg via ORAL
  Filled 2014-12-02: qty 300

## 2014-12-02 MED ORDER — ZOLPIDEM TARTRATE 5 MG PO TABS: 5.0000 mg | ORAL_TABLET | Freq: Every evening | ORAL | Status: DC | PRN

## 2014-12-02 MED ORDER — MAGIC MOUTHWASH
15.0000 mL | Freq: Four times a day (QID) | ORAL | Status: DC | PRN
  Filled 2014-12-02: qty 15

## 2014-12-02 MED ORDER — METRONIDAZOLE IN NACL 5-0.79 MG/ML-% IV SOLN
500.0000 mg | Freq: Three times a day (TID) | INTRAVENOUS | Status: DC
  Administered 2014-12-02: 500 mg via INTRAVENOUS
  Filled 2014-12-02: qty 100

## 2014-12-02 MED ORDER — NAPROXEN SODIUM 220 MG PO TABS: 220.0000 mg | ORAL_TABLET | Freq: Two times a day (BID) | ORAL | Status: DC

## 2014-12-02 MED ORDER — ALBUTEROL SULFATE (2.5 MG/3ML) 0.083% IN NEBU: 2.5000 mg | INHALATION_SOLUTION | Freq: Four times a day (QID) | RESPIRATORY_TRACT | Status: DC | PRN

## 2014-12-02 MED ORDER — IOHEXOL 300 MG/ML  SOLN
50.0000 mL | Freq: Once | INTRAMUSCULAR | Status: AC | PRN
  Administered 2014-12-02: 50 mL via ORAL

## 2014-12-02 MED ORDER — ALUM & MAG HYDROXIDE-SIMETH 200-200-20 MG/5ML PO SUSP: 30.0000 mL | Freq: Four times a day (QID) | ORAL | Status: DC | PRN

## 2014-12-02 MED ORDER — PSYLLIUM 95 % PO PACK
1.0000 | PACK | Freq: Two times a day (BID) | ORAL | Status: DC
  Administered 2014-12-02 – 2014-12-09 (×8): 1 via ORAL
  Filled 2014-12-02 (×17): qty 1

## 2014-12-02 MED ORDER — GLUCOSE BLOOD VI STRP: 1.0000 | ORAL_STRIP | Status: DC | PRN

## 2014-12-02 MED ORDER — ONDANSETRON HCL 40 MG/20ML IJ SOLN
8.0000 mg | Freq: Three times a day (TID) | INTRAMUSCULAR | Status: DC
  Administered 2014-12-02 – 2014-12-12 (×28): 8 mg via INTRAVENOUS
  Filled 2014-12-02 (×37): qty 4

## 2014-12-02 MED ORDER — METFORMIN HCL 500 MG PO TABS
1000.0000 mg | ORAL_TABLET | Freq: Two times a day (BID) | ORAL | Status: DC
  Administered 2014-12-02 – 2014-12-03 (×3): 1000 mg via ORAL
  Filled 2014-12-02 (×6): qty 2

## 2014-12-02 MED ORDER — LIP MEDEX EX OINT
1.0000 "application " | TOPICAL_OINTMENT | Freq: Two times a day (BID) | CUTANEOUS | Status: DC
  Administered 2014-12-02 – 2014-12-12 (×19): 1 via TOPICAL
  Filled 2014-12-02: qty 7

## 2014-12-02 MED ORDER — SODIUM CHLORIDE 0.9 % IJ SOLN: 3.0000 mL | INTRAMUSCULAR | Status: DC | PRN

## 2014-12-02 MED ORDER — ENOXAPARIN SODIUM 40 MG/0.4ML ~~LOC~~ SOLN
40.0000 mg | SUBCUTANEOUS | Status: DC
Start: 2014-12-02 — End: 2014-12-04
  Administered 2014-12-03: 40 mg via SUBCUTANEOUS
  Filled 2014-12-02 (×3): qty 0.4

## 2014-12-02 MED ORDER — LINAGLIPTIN 5 MG PO TABS
5.0000 mg | ORAL_TABLET | Freq: Every day | ORAL | Status: DC
  Administered 2014-12-03: 5 mg via ORAL
  Filled 2014-12-02 (×2): qty 1

## 2014-12-02 MED ORDER — IOHEXOL 300 MG/ML  SOLN
100.0000 mL | Freq: Once | INTRAMUSCULAR | Status: AC | PRN
  Administered 2014-12-02: 100 mL via INTRAVENOUS

## 2014-12-02 MED ORDER — HYDROCHLOROTHIAZIDE 12.5 MG PO CAPS
12.5000 mg | ORAL_CAPSULE | Freq: Every morning | ORAL | Status: DC
  Administered 2014-12-03 – 2014-12-12 (×8): 12.5 mg via ORAL
  Filled 2014-12-02 (×10): qty 1

## 2014-12-02 MED ORDER — SODIUM CHLORIDE 0.9 % IV SOLN
2000.0000 mg | Freq: Once | INTRAVENOUS | Status: AC
  Administered 2014-12-02: 2000 mg via INTRAVENOUS
  Filled 2014-12-02: qty 2000

## 2014-12-02 MED ORDER — ONDANSETRON HCL 4 MG PO TABS: 4.0000 mg | ORAL_TABLET | Freq: Four times a day (QID) | ORAL | Status: DC | PRN

## 2014-12-02 MED ORDER — PRENATAL MULTIVITAMIN CH
1.0000 | ORAL_TABLET | Freq: Every day | ORAL | Status: DC
  Administered 2014-12-03 – 2014-12-12 (×6): 1 via ORAL
  Filled 2014-12-02 (×10): qty 1

## 2014-12-02 MED ORDER — HYDROMORPHONE HCL 1 MG/ML IJ SOLN
1.0000 mg | INTRAMUSCULAR | Status: DC | PRN
  Administered 2014-12-02: 1 mg via INTRAVENOUS
  Filled 2014-12-02: qty 1

## 2014-12-02 MED ORDER — SACCHAROMYCES BOULARDII 250 MG PO CAPS
250.0000 mg | ORAL_CAPSULE | Freq: Two times a day (BID) | ORAL | Status: DC
  Administered 2014-12-02 – 2014-12-12 (×16): 250 mg via ORAL
  Filled 2014-12-02 (×21): qty 1

## 2014-12-02 MED ORDER — POTASSIUM CHLORIDE 10 MEQ/50ML IV SOLN: 10.0000 meq | INTRAVENOUS | Status: DC | PRN

## 2014-12-02 MED ORDER — POLYETHYLENE GLYCOL 3350 17 G PO PACK: 17.0000 g | PACK | Freq: Two times a day (BID) | ORAL | Status: DC | PRN

## 2014-12-02 MED ORDER — ROSUVASTATIN CALCIUM 10 MG PO TABS
10.0000 mg | ORAL_TABLET | Freq: Every evening | ORAL | Status: DC
  Administered 2014-12-02 – 2014-12-11 (×7): 10 mg via ORAL
  Filled 2014-12-02 (×11): qty 1

## 2014-12-02 MED ORDER — LEVOTHYROXINE SODIUM 175 MCG PO TABS
175.0000 ug | ORAL_TABLET | Freq: Every day | ORAL | Status: DC
  Administered 2014-12-03 – 2014-12-12 (×8): 175 ug via ORAL
  Filled 2014-12-02 (×11): qty 1

## 2014-12-02 MED ORDER — ADULT MULTIVITAMIN W/MINERALS CH: 1.0000 | ORAL_TABLET | Freq: Every day | ORAL | Status: DC

## 2014-12-02 MED ORDER — CIPROFLOXACIN IN D5W 400 MG/200ML IV SOLN
400.0000 mg | Freq: Two times a day (BID) | INTRAVENOUS | Status: DC
  Filled 2014-12-02: qty 200

## 2014-12-02 MED ORDER — PANTOPRAZOLE SODIUM 40 MG PO TBEC
40.0000 mg | DELAYED_RELEASE_TABLET | Freq: Every day | ORAL | Status: DC
  Administered 2014-12-02 – 2014-12-10 (×7): 40 mg via ORAL
  Filled 2014-12-02 (×10): qty 1

## 2014-12-02 MED ORDER — MENTHOL 3 MG MT LOZG
1.0000 | LOZENGE | OROMUCOSAL | Status: DC | PRN
  Filled 2014-12-02: qty 9

## 2014-12-02 MED ORDER — IRBESARTAN 300 MG PO TABS
300.0000 mg | ORAL_TABLET | Freq: Every day | ORAL | Status: DC
  Administered 2014-12-02 – 2014-12-12 (×9): 300 mg via ORAL
  Filled 2014-12-02 (×11): qty 1

## 2014-12-02 MED ORDER — ALBUTEROL SULFATE HFA 108 (90 BASE) MCG/ACT IN AERS: 1.0000 | INHALATION_SPRAY | RESPIRATORY_TRACT | Status: DC

## 2014-12-02 MED ORDER — DOCUSATE SODIUM 100 MG PO CAPS: 100.0000 mg | ORAL_CAPSULE | Freq: Every day | ORAL | Status: DC

## 2014-12-02 MED ORDER — POTASSIUM CHLORIDE 10 MEQ/100ML IV SOLN
10.0000 meq | INTRAVENOUS | Status: AC
  Administered 2014-12-02 – 2014-12-03 (×3): 10 meq via INTRAVENOUS
  Filled 2014-12-02 (×6): qty 100

## 2014-12-02 MED ORDER — SODIUM CHLORIDE 0.9 % IJ SOLN: 3.0000 mL | Freq: Two times a day (BID) | INTRAMUSCULAR | Status: DC

## 2014-12-02 MED ORDER — POTASSIUM CHLORIDE CRYS ER 20 MEQ PO TBCR: 20.0000 meq | EXTENDED_RELEASE_TABLET | ORAL | Status: DC | PRN

## 2014-12-02 MED ORDER — IBUPROFEN 800 MG PO TABS
800.0000 mg | ORAL_TABLET | Freq: Three times a day (TID) | ORAL | Status: DC
  Administered 2014-12-02 – 2014-12-03 (×4): 800 mg via ORAL
  Filled 2014-12-02 (×7): qty 1

## 2014-12-02 NOTE — Progress Notes (Signed)
[facility]  [address] [place], [place] [address] Phone: [phone] FAX: [phone]    [name] [id number] [birth year]  CARE TEAM:  PCP: [name]., MD  Outpatient Care Team: Patient Care Team: [name] Gamer, MD as PCP - General (Internal Medicine) [name] Records, MD as Referring Physician (Obstetrics and Gynecology) [name] Lark, MD as Consulting Physician (Hematology and Oncology) [name] Amber, MD as Consulting Physician (Obstetrics and Gynecology) [name] [name], MD as Consulting Physician (General Surgery)  Inpatient Treatment Team: Treatment Team: Attending Provider: Everitt Amber, MD; Registered Nurse: [name] Burns, RN; Technician: [name], NT; Consulting Physician: [name] [name], MD; Respiratory Therapist: Nelly [name], RRT  Problem List:   Principal Problem:   Postoperative wound infection Active Problems:   Chronic neutropenia      Procedure(s): POST-OPERATIVE DIAGNOSIS: 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 TWO LYMPH NODES, NEGATIVE FOR ATYPIA OR MALIGNANCY (0/2). RESECTION MARGINS, NEGATIVE FOR ATYPIA OR MALIGNANCY.  Microscopic Comment Please see [name] description for details. [name] LI MD Pathologist, Electronic Signature (Case signed 11/21/2014) Specimen [name] and Clinical Information Specimen(s) Obtained: Colon, segmental resection for  tumor, rectosigmoid colon with upper vagina Specimen Clinical Information endometrial cancer (kp) [name] The specimen is received in formalin labeled rectosigmoid colon and upper vagina, and consists of an 18.1 cm in length portion of colon, with the proximal margin stapled and the distal margin open. On the anterior aspect of the colon, there is a 2.7 x 2.6 cm slightly disrupted circular portion of gray-brown mucosa designated by a stitch, which is clinically stated as posterior vaginal wall. The resection margin surrounding the vaginal tissue is inked black, and the distal resection margin is inked black for orientation. The soft tissue surrounding the vaginal tissue along the right side is slightly firm. The lumen of the colon contains a small amount of green-brown fecal material, and the mucosa is tan-pink with normal folding. No mucosal lesions are grossly identified, however, palpating the colon, which overlies the indurated adipose tissue reveals a firm nodule within the possible submucosa and muscularis. The nodule measures approximately 0.6 cm, and is 2.9 cm from the distal resection margin. Sectioning through the distal aspect of the colon and underlying soft tissue reveals a 4.6 x 4.5 x 4.0 cm tan-red, hemorrhagic, softened lesion within the 1 of 2 FINAL for [name], [name] ([id number]) [name](continued) underlying adipose tissue. The lesion invades into the overlying muscularis propria at the area previously noted. The colon wall measures 0.5 cm in thickness. Three tan-pink possible lymph nodes are identified, ranging from 0.2 cm to 0.5 cm in greatest dimension. Please note the patient is status post chemotherapy. Representative sections are submitted in twelve cassettes. A = proximal resection margin. B, C = distal resection margin. D - F = lesion to overlying colon. G, H = lesion. I, J = vaginal mucosa. K = uninvolved mucosa. L = three possible lymph nodes. (KL:ecj  11/20/2014) Report signed out from the following location(s) Technical component and interpretation was performed at [facility]  Assessment  Cellulitis lateral to colostomy   Plan:  IV ABx.  Vanco/Clindamycin with ### ABx allergies.    Agree w CT scan r/o abscess.  Suspect fat necrosis and/or hematoma w cellulitis.  Stoma pink = doubt deeper leak/ischemia/perforation.  The patient is stable.  There is no evidence of peritonitis, acute abdomen, nor shock.  There is no strong evidence of failure of improvement nor decline with current non-operative management.  There is no need for surgery at the present moment.  We will continue to follow.  Anemia.  Low Hgb - follow - transfuse if Hgb <7 and symptomatic if GynOnc [name]/LJM agree.  ?Reinvolve Hematology/oncology - CSF to help stim BMarrow?  Pt asymptomatic.  [name] wishes to hold off on transfusion.  Pt agrees.  Low K - replace & check Mg  Pain control -  Continue tylenol RTC.  PO oxycodone w IV Dilaudid backup.  Hold NSAIDs with falling Hgb  Colostomy care.  Get WOCN involved.  No leak/ moisture = probably no severe problem but check  Foley - DO NOT REMOVE due to bladder repair & ureteral stenting.  She had complex cystotomy repair (trigone and base) and ureteral stenting. DO NOT REMOVE FOLEY CATHETER - will perform retrograde cystogram in 12May will d.c foley at that time IF no leak noted.   DRAIN:  Per [name] / Gyn Onc, check JP creatinine prior to removing drain (if elevated above serum levels would keep JP in until foley comes out/bladder integrity confirmed on imaging). Remove ureteral stents at 6 weeks if no stricture/leak on CT urogram.  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 TWO LYMPH NODES, NEGATIVE FOR ATYPIA OR MALIGNANCY (0/2). I am skeptical that only 2 LN were found in 18cm of intact mesorectum & mesocolon although not a  colon adenoCA = may not be usually drainage point for the recurrent endometriod CA  RESECTION MARGINS, NEGATIVE FOR ATYPIA OR MALIGNANCY.  Glc control - ? SSI.  Restarting PO hypoglycemics  HTN - OK off meds - restart gradually.  GERD - protonix daily  -VTE prophylaxis- SCDs, etc  -mobilize as tolerated to help recovery    I updated the patient's status to the patient & family.  Recommendations were made.  Questions were answered.  The patient expressed understanding & appreciation.   [name], M.D., F.A.C.S. Gastrointestinal and Minimally Invasive Surgery [facility] [address], [place], [place] [address] [phone] Main / Paging   12/02/2014  Subjective:  Moved to 3W onc floor Pain minimized w Dilaudid PCA.  Fent never worked well for her [employer] in hallways  Objective:  Vital signs:  Filed Vitals:   12/02/14 1517  BP: 153/78  Pulse: 86  Temp: 98.1 F (36.7 C)  TempSrc: Oral  Resp: 16  Height: _0  (1.575 m)  Weight: 113.399 kg (250 lb)  SpO2: 100%       Intake/Output   Yesterday:    This shift:     Bowel function:  Flatus: large volume  BM: n  Drain: thinly serosanguinous  Physical Exam:  General: Pt awake/alert/oriented in no acute distress.  Smiling, chatty Eyes: PERRL, normal EOM.  Sclera clear.  No icterus Neuro: CN II-XII intact w/o focal sensory/motor deficits. Lymph: No head/neck/groin lymphadenopathy Psych:  No delerium/psychosis/paranoia HENT: Normocephalic, Mucus membranes moist.  No thrush.  ETT in place Neck: Supple, No tracheal deviation Chest: No chest wall pain w good excursion CV:  Pulses intact.  Regular rhythm MS: Normal AROM mjr joints.  No obvious deformity Abdomen: Soft.  Nondistended.  Dressings w old blood.  Mildly tender at incisions only.  No evidence of peritonitis.  No incarcerated hernias. Ext:  SCDs BLE.  No mjr edema.  No cyanosis Skin: No petechiae /  purpura  Results:   Labs: Results for orders placed or performed during the hospital encounter of 12/02/14 (from the past 48 hour(s))  Basic metabolic panel     Status: Abnormal   Collection Time: 12/02/14  5:31 PM  Result Value Ref Range   Sodium 138 135 - 145 mmol/L   Potassium 2.8 (L) 3.5 - 5.1 mmol/L   Chloride 103 101 - 111 mmol/L   CO2 29 22 - 32 mmol/L   Glucose, Bld 130 (H) 70 - 99 mg/dL   BUN 11 6 - 20 mg/dL   Creatinine, Ser 0.67 0.44 - 1.00 mg/dL   Calcium 9.1 8.9 - 10.3 mg/dL   GFR calc non Af Amer >60 >60 mL/min   GFR calc Af Amer >60 >60 mL/min    Comment: (NOTE) The eGFR has been calculated using the CKD EPI equation. This calculation has not been validated in all clinical situations. eGFR's persistently <90 mL/min signify possible Chronic Kidney Disease.    Anion gap 6 5 - 15  CBC WITH DIFFERENTIAL     Status: Abnormal (Preliminary result)   Collection Time: 12/02/14  5:31 PM  Result Value Ref Range   WBC 4.9 4.0 - 10.5 K/uL   RBC 2.60 (L) 3.87 - 5.11 MIL/uL   Hemoglobin 6.6 (LL) 12.0 - 15.0 g/dL    Comment: REPEATED TO VERIFY CRITICAL RESULT CALLED TO, READ BACK BY AND VERIFIED WITH: F HERDON RN _0  5.2.16 BY A MORRIS    HCT 22.7 (L) 36.0 - 46.0 %   MCV 87.3 78.0 - 100.0 fL   MCH 25.4 (L) 26.0 - 34.0 pg   MCHC 29.1 (L) 30.0 - 36.0 g/dL   RDW 17.4 (H) 11.5 - 15.5 %   Platelets 537 (H) 150 - 400 K/uL   Neutrophils Relative % PENDING 43 - 77 %   Neutro Abs PENDING 1.7 - 7.7 K/uL   Band Neutrophils PENDING 0 - 10 %   Lymphocytes Relative PENDING 12 - 46 %   Lymphs Abs PENDING 0.7 - 4.0 K/uL   Monocytes Relative PENDING 3 - 12 %   Monocytes Absolute PENDING 0.1 - 1.0 K/uL   Eosinophils Relative PENDING 0 - 5 %   Eosinophils Absolute PENDING 0.0 - 0.7 K/uL   Basophils Relative PENDING 0 - 1 %   Basophils Absolute PENDING 0.0 - 0.1 K/uL   WBC Morphology PENDING    RBC Morphology PENDING    Smear Review PENDING    nRBC PENDING 0 /100 WBC    Metamyelocytes Relative PENDING %   Myelocytes PENDING %   Promyelocytes Absolute PENDING %   Blasts PENDING %  Glucose, capillary     Status: Abnormal   Collection Time: 12/02/14  5:36 PM  Result Value Ref Range   Glucose-Capillary 106 (H) 70 - 99 mg/dL   Comment 1 Notify RN    Comment 2 Document in Chart     Imaging / Studies: No results found.  Medications / Allergies: per chart  Antibiotics: Anti-infectives    Start     Dose/Rate Route Frequency Ordered Stop   12/03/14 0600  vancomycin (VANCOCIN) IVPB 1000 mg/200 mL premix     1,000 mg  200 mL/hr over 60 Minutes Intravenous Every 12 hours 12/02/14 1817     12/02/14 1900  clindamycin (CLEOCIN) IVPB 600 mg     600 mg 100 mL/hr over 30 Minutes Intravenous 4 times per day 12/02/14 1847     12/02/14 1830  metroNIDAZOLE (FLAGYL) IVPB 500 mg  Status:  Discontinued     500 mg 100 mL/hr over 60 Minutes Intravenous Every 8 hours 12/02/14 1816 12/02/14 1847   12/02/14 1815  vancomycin (VANCOCIN) 2,000 mg in sodium chloride 0.9 % 500 mL IVPB     2,000 mg 250 mL/hr over 120 Minutes Intravenous  Once 12/02/14 1814     12/02/14 1815  ciprofloxacin (CIPRO) IVPB 400 mg  Status:  Discontinued     400 mg 200 mL/hr over 60 Minutes Intravenous Every 12 hours 12/02/14 1816 12/02/14 1847   12/02/14 1800  metroNIDAZOLE (FLAGYL) IVPB 500 mg  Status:  Discontinued     500 mg 100 mL/hr over 60 Minutes Intravenous Every 8 hours 12/02/14 1649 12/02/14 1733   12/02/14 1700  ciprofloxacin (CIPRO) IVPB 200 mg  Status:  Discontinued     200 mg 100 mL/hr over 60 Minutes Intravenous Every 12 hours 12/02/14 1646 12/02/14 1733       Note: Portions of this report may have been transcribed using voice recognition software. Every effort was made to ensure accuracy; however, inadvertent computerized transcription errors may be present.   Any transcriptional errors that result from this process are unintentional.     [name], M.D.,  F.A.C.S. Gastrointestinal and Minimally Invasive Surgery [facility] [address], [place], [place] [address] [phone] Main / Paging   12/02/2014

## 2014-12-02 NOTE — H&P (Signed)
ADMISSION H&P  Assessment:  59 y.o. year old with recurrent endometrioid endometrial/ovarian cancer s/p exploratory laparotomy, posterior supralevator pelvic exenteration, end colostomy, bladder repair, ureteral stenting on 11/09/14, now with surgical site infection peri-stomal in setting of chronic neutropenia. Clinically stable with no signs of sepsis or SIRS. Left lower extremity edema (new).  Plan: 1/ Wound infection/cellulitis: admit to hospital of IV antibiotics. She is stable. However, given her underlying neutropenia, I am recommending IV antibiotics. We will start with cipro/flagyl, and add vanc if does not resolve/improve in 48 hours.  Draw blood cultures if spikes temp >100.4. CT abdo/pelvis to evaluate for abdominal wall collection that requires drainage Mark edge of erythema and monitor for progression/regression  2/ Chronic neutropenia (idiopathic): Dr Alvy Bimler is followup. Appreciate this. Granix prn pending WBC and clinical picture.  3/ Complex bladder repair with ureteral stents, foley placement and JP drain: check JP creatinine today. If normal, will pull JP. Continue foley catheter. Do not remove. Plan on retrograde cystogram in 1-2 weeks. Stent removal 6 weeks postop pending CT urogram results.  4/ Left lower extremity edema: possible DVT. Patient is on anticoagulation (lovenox) prophylaxis. Will obtain LE dopplers and change to therapeutic lovenox if DVT identified.  5/ Acute blood loss anemia: will reassess CBC. On FeSO4 - continue.   6/ type II DM: continue home meds.   HPI: Talaya Wiebke is a 59 y.o. 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 She then underwent a posterior supralevator exenteration with colostomy and bladder repair and stent placement on [id number] without complications. Her postoperative course was uncomplicate with the exception of  development of postop anemia. Her final pathology revealed endometrioid adenocarcinoma invading the vagina and rectum with negative margins on the specimen. The margin had been close (clinically) to the right pelvic sidewall which was m.  She is seen today for a postoperative check and to discuss her pathology results and ongoing plan. Since discharge from the hospital, she is feeling generally well. She has improving appetite, functioning stoma, and pain controlled with minimal PO medication. She has had increasing temp's particularly at night with a 100.0 temp last night (others in the 99's). She notices increased redness and discomfort around her stoma in past 48 hours and hardness underlying it.   Review of systems: Constitutional: She has no weight gain or weight loss. She has a low grade fever no chills. Eyes: No blurred vision Ears, Nose, Mouth, Throat: No dizziness, headaches or changes in hearing. No mouth sores. Cardiovascular: No chest pain, palpitations or edema. Respiratory: No shortness of breath, wheezing or cough Gastrointestinal: She has normal bowel movements trhough stoma without diarrhea or constipation. She denies any nausea or vomiting. She denies blood in her stool or heart burn. Genitourinary: She denies pelvic pain, pelvic pressure or changes in her urinary function. She has no hematuria, dysuria, or incontinence (foley in situ). She has no irregular vaginal bleeding or vaginal discharge Musculoskeletal: Denies muscle weakness or joint pains.  Skin: She has no skin changes, rashes or itching Neurological: Denies dizziness or headaches. No neuropathy, no numbness or tingling. Psychiatric: She denies depression or anxiety. Hematologic/Lymphatic: No easy bruising or bleeding  Allergies  Allergen Reactions  . Penicillins Swelling    Facial swelling  . Ultram [Tramadol] Hives  . Adhesive [Tape]     blisters  . Cefaclor Rash    Ceclor  . Erythromycin      gastritis  .  Trimethoprim Rash  . Sulfa Antibiotics Rash    Current Facility-Administered Medications on File Prior to Encounter  Medication Dose Route Frequency Provider Last Rate Last Dose  . Tbo-Filgrastim (GRANIX) injection 480 mcg  480 mcg Subcutaneous Weekly Heath Lark, MD   480 mcg at 11/29/14 1338   Current Outpatient Prescriptions on File Prior to Encounter  Medication Sig Dispense Refill  . acetaminophen (TYLENOL) 500 MG tablet Take 2 tablets (1,000 mg total) by mouth 3 (three) times daily. 30 tablet 0  . Calcium Carbonate-Vitamin D (CALCIUM + D PO) Take 2 tablets by mouth daily.    . Canagliflozin (INVOKANA) 300 MG TABS Take 1 tablet by mouth every morning.     . Cholecalciferol (VITAMIN D3) 10000 UNITS capsule Take 10,000 Units by mouth once a week. Sundays    . docusate sodium (COLACE) 100 MG capsule Take 100 mg by mouth daily.    [name] Kitchen enoxaparin (LOVENOX) 40 MG/0.4ML injection Inject 0.4 mLs (40 mg total) into the skin daily. 21 Syringe 1  . ferrous sulfate 325 (65 FE) MG tablet Take 1 tablet (325 mg total) by mouth 2 (two) times daily with a meal. 60 tablet 3  . hydrochlorothiazide (HYDRODIURIL) 12.5 MG tablet Take 12.5 mg by mouth every morning.     [name] Kitchen ibuprofen (ADVIL,MOTRIN) 800 MG tablet     . levothyroxine (SYNTHROID) 175 MCG tablet Take 175 mcg by mouth daily before breakfast.    . metFORMIN (GLUCOPHAGE) 1000 MG tablet Take 1,000 mg by mouth 2 (two) times daily with a meal.     . Multiple Vitamin (MULTIVITAMIN WITH MINERALS) TABS tablet Take 1 tablet by mouth daily.    [name] Kitchen omega-3 acid ethyl esters (LOVAZA) 1 G capsule Take 1 g by mouth 2 (two) times daily.    [name] Kitchen omeprazole (PRILOSEC) 20 MG capsule Take 20 mg by mouth daily.    . ondansetron (ZOFRAN) 4 MG tablet Take 1 tablet (4 mg total) by mouth every 6 (six) hours as needed for nausea. 20 tablet 0  . ONETOUCH VERIO test strip     . oxyCODONE (OXY IR/ROXICODONE) 5 MG immediate release tablet Take 1-2 tablets (5-10 mg total)  by mouth every 4 (four) hours as needed for moderate pain, severe pain or breakthrough pain. 30 tablet 0  . Polyethyl Glycol-Propyl Glycol (SYSTANE OP) Apply 1 drop to eye daily as needed (Dry eyes).     [name] Kitchen PROVENTIL HFA 108 (90 BASE) MCG/ACT inhaler   0  . rosuvastatin (CRESTOR) 10 MG tablet Take 10 mg by mouth every evening.     . sitaGLIPtin (JANUVIA) 100 MG tablet Take 100 mg by mouth daily.    . Tbo-Filgrastim (GRANIX) 480 MCG/0.8ML SOSY injection Inject 480 mcg into the skin once a week.    . traMADol (ULTRAM) 50 MG tablet   0  . valsartan (DIOVAN) 320 MG tablet Take 320 mg by mouth every morning.      Past Medical History  Diagnosis Date  . Hypothyroidism 12/14/2011  . Benign essential HTN 12/14/2011  . Neutropenia   . Postmenopausal bleeding 09/05/2014  . Anemia     Planned Neupogen injections for chronic neutropenia  . Transfusion history     '06-s/p hysterectomy and staging  . Multiple thyroid nodules y-8    Managed by Dr. Harlow Asa  . GERD (gastroesophageal reflux disease)   . Polyp of duodenum y-2  . DM type 2 (diabetes mellitus, type 2) 12/14/2011  . Dysrhythmia     skipped beat  with occasional coffee   . Hx of bronchitis   . Depression   . Hiatal hernia y-12    hx of small hiatal hernia   . Neutropenia     chronic   . Cancer     '06-Ovarian/ Uterine Cancer-chemotherapy  . 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 Appendectomy    . Tonsillectomy    . Cholecystectomy      laparoscopic  . Eye surgery      pytosis of eyelids-child  . Colonoscopy with propofol N/A 08/21/2013    Procedure: COLONOSCOPY WITH PROPOFOL;  Surgeon: Cleotis Nipper, MD;  Location: [facility];  Service: Endoscopy;  Laterality: N/A;  . Abdominal hysterectomy      staging for Ovarian cancer  . Eus N/A 10/02/2014    Procedure: LOWER ENDOSCOPIC ULTRASOUND (EUS);  Surgeon: Arta Silence, MD;  Location: [facility];  Service: Endoscopy;   Laterality: N/A;  . Robotic assisted lap vaginal hysterectomy N/A 11/19/2014    Procedure: ROBOTIC LYSIS OF ADHESIONS, CONVERTED TO LAPAROTOMY RADICAL UPPER VAGINECTOMY,LOW ANTERIOR BOWEL RESECTION, COLOSTOMY, BILATERAL URETERAL STENT PLACEMENT AND CYSTONOMY CLOSURE;  Surgeon: Everitt Amber, MD;  Location: [facility];  Service: Gynecology;  Laterality: N/A;  . Ostomy N/A 11/19/2014    Procedure: OSTOMY;  Surgeon: Michael [name], MD;  Location: [facility];  Service: General;  Laterality: N/A;    Family History  Problem Relation Age of Onset  . Cancer Mother     stomach ca  . Hypertension Mother   . Cancer Father     prostate ca  . Diabetes Father   . Diabetes Sister   . Hypertension Brother y-10    History   Social History  . Marital Status: Married    Spouse Name: N/A  . Number of Children: N/A  . Years of Education: N/A   Occupational History  . Not on file.   Social History Main Topics  . Smoking status: Never Smoker   . Smokeless tobacco: Never Used  . Alcohol Use: Yes     Comment: rare social  . Drug Use: No  . Sexual Activity: Not Currently   Other Topics Concern  . Not on file   Social History Narrative     Physical Exam: Blood pressure 136/60, pulse 94, temperature 98.2 F (36.8 C), temperature source Oral, resp. rate 18, height 5\' 2"  (1.575 m), weight 252 lb 12.8 oz (114.669 kg). General: Well dressed, well nourished in no apparent distress.  HEENT: Normocephalic and atraumatic, no lesions. Extraocular muscles intact. Sclerae anicteric. Pupils equal, round, reactive. No mouth sores or ulcers. Thyroid is normal size, not nodular, midline. Skin: No lesions or rashes. Breasts: deferred Lungs: Clear to auscultation bilaterally. No wheezes. Cardiovascular: Regular rate and rhythm. No murmurs or rubs. Abdomen: Soft, nontender, nondistended. No palpable masses. No hepatosplenomegaly. No ascites. Normal bowel sounds. No hernias. Incision is healing with no  drainage or erythema. No drainage. To the left of the colostomy (lateral border) is blanching erythema and underlying firmness of the subQ tissue, consistent with cellulitis. Extended beyond previously marked edge of erythema. No underlying fluctuance Genitourinary: deferred Extremities: No cyanosis, clubbing. LLE edema (L>R). No calf tenderness or erythema. No palpable cords. Psychiatric: Mood and affect are appropriate. Neurological: Awake, alert and oriented x 3. Sensation is intact, no neuropathy.  Musculoskeletal: No pain, normal strength and range of motion.  Donaciano Eva, MD

## 2014-12-02 NOTE — Progress Notes (Signed)
The patient is POD 13 s/p ex lap, posterior supralevator exenteration, bladder repair and ureteral stenting, colostomy formation for recurrent endometrioid endometrial cancer.  She is presenting to the clinic for evaluation of progressive cellulitis of the colostomy site. She had a low grade temperature 48 hours ago to 100.0. She denies chills. She has significant pain at the site (to the left of the stoma).  On exam the area has increased erythema compared to when I saw this site 72 hours ago.  There is some firmness to exam.  The stoma is functioning.  Given that she has underlying chronic idiopathic neutropenia, and is failing outpatient antibiotics (levaquin). I am going to admit her for IV antibiotics.  She has LE edema - will order dopplers.  Donaciano Eva, MD

## 2014-12-02 NOTE — Telephone Encounter (Signed)
Message received from pts husband this am regarding Red area around ostomy. Filled and began Levaquin 4/29. Call received from [place] , [place] pt has an area on outside of ostomy, pink and warm to touch, temp of 98, hard area at stoma and is sore to touch. Reviewed with NP, pt to see MD today at 1145. Pt and husband aware and confirmed,. Call placed to lab, Pt's JP creatine fluid results are not back at this time. Results will be back by Wednesday 5/3.

## 2014-12-02 NOTE — Progress Notes (Addendum)
Patient currently in radiology. Spoke with husband, who states his wife has asked repeatedly for someone to take the albuterol inhaler off of her medication list because she does not and has never used it, even when it was prescribed in the past for allergy symptoms. Per the husband, the patient has thrown the unused inhaler in the trash prior to this hospital visit due to not using it. Order placed for prn BD per RT protocol.  Upon patient return from CT, she confirms the husbands statements. NAD on room air.

## 2014-12-02 NOTE — Progress Notes (Signed)
ANTIBIOTIC CONSULT NOTE - INITIAL  Pharmacy Consult for cipro/flagyl/vanc Indication: wound infection/cellulitis/neutropenia  Allergies  Allergen Reactions  . Penicillins Swelling    Facial swelling  . Ultram [Tramadol] Hives  . Adhesive [Tape]     blisters  . Cefaclor Rash    Ceclor  . Erythromycin     gastritis  . Trimethoprim Rash  . Sulfa Antibiotics Rash    Patient Measurements: Height: 5\' 2"  (157.5 cm) Weight: 250 lb (113.399 kg) IBW/kg (Calculated) : 50.1   Vital Signs: Temp: 98.1 F (36.7 C) (05/02 1517) Temp Source: Oral (05/02 1517) BP: 153/78 mmHg (05/02 1517) Pulse Rate: 86 (05/02 1517) Intake/Output from previous day:   Intake/Output from this shift:    Labs: No results for input(s): WBC, HGB, PLT, LABCREA, CREATININE in the last 72 hours. Estimated Creatinine Clearance: 89 mL/min (by C-G formula based on Cr of 0.7). No results for input(s): VANCOTROUGH, VANCOPEAK, VANCORANDOM, GENTTROUGH, GENTPEAK, GENTRANDOM, TOBRATROUGH, TOBRAPEAK, TOBRARND, AMIKACINPEAK, AMIKACINTROU, AMIKACIN in the last 72 hours.   Microbiology: Recent Results (from the past 720 hour(s))  MRSA PCR Screening     Status: None   Collection Time: 11/19/14  9:08 PM  Result Value Ref Range Status   MRSA by PCR NEGATIVE NEGATIVE Final    Comment:        The GeneXpert MRSA Assay (FDA approved for NASAL specimens only), is one component of a comprehensive MRSA colonization surveillance program. It is not intended to diagnose MRSA infection nor to guide or monitor treatment for MRSA infections.     Medical History: Past Medical History  Diagnosis Date  . Hypothyroidism 12/14/2011  . Benign essential HTN 12/14/2011  . Neutropenia   . Postmenopausal bleeding 09/05/2014  . Anemia     Planned Neupogen injections for chronic neutropenia  . Transfusion history     '06-s/p hysterectomy and staging  . Multiple thyroid nodules y-8    Managed by Dr. Harlow Asa  . GERD (gastroesophageal  reflux disease)   . Polyp of duodenum y-2  . DM type 2 (diabetes mellitus, type 2) 12/14/2011  . Dysrhythmia     skipped beat with occasional coffee   . Hx of bronchitis   . Depression   . Hiatal hernia y-12    hx of small hiatal hernia   . Neutropenia     chronic   . Cancer     '06-Ovarian/ Uterine Cancer-chemotherapy  . 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 year old with recurrent endometrioid endometrial/ovarian cancer s/p exploratory laparotomy, posterior supralevator pelvic exenteration, end colostomy, bladder repair, ureteral stenting on 11/09/14, now with surgical site infection peri-stomal in setting of chronic neutropenia. Clinically stable with no signs of sepsis or SIRS. Wound infection/cellulitis: admit to hospital of IV antibiotics  Goal of Therapy:  Vancomycin trough level 15-20 mcg/ml  Antibiotics per renal function and indication  Plan:  Vancomycin 2000mg  IV x1 then vanc 1gm IV q12h Cipro 400mg  IV q12h Flagyl 500mg  IV q8h Follow renal function,cultures, clinical course vanc trough as needed  Dolly Rias RPh 12/02/2014, 6:08 PM Pager [phone]

## 2014-12-03 ENCOUNTER — Inpatient Hospital Stay (HOSPITAL_COMMUNITY): Payer: BLUE CROSS/BLUE SHIELD

## 2014-12-03 DIAGNOSIS — D500 Iron deficiency anemia secondary to blood loss (chronic): Secondary | ICD-10-CM

## 2014-12-03 DIAGNOSIS — E119 Type 2 diabetes mellitus without complications: Secondary | ICD-10-CM

## 2014-12-03 DIAGNOSIS — K9402 Colostomy infection: Secondary | ICD-10-CM

## 2014-12-03 DIAGNOSIS — Z8543 Personal history of malignant neoplasm of ovary: Secondary | ICD-10-CM

## 2014-12-03 DIAGNOSIS — R600 Localized edema: Secondary | ICD-10-CM

## 2014-12-03 DIAGNOSIS — D709 Neutropenia, unspecified: Secondary | ICD-10-CM

## 2014-12-03 DIAGNOSIS — M7989 Other specified soft tissue disorders: Secondary | ICD-10-CM

## 2014-12-03 DIAGNOSIS — D473 Essential (hemorrhagic) thrombocythemia: Secondary | ICD-10-CM

## 2014-12-03 DIAGNOSIS — E876 Hypokalemia: Secondary | ICD-10-CM

## 2014-12-03 DIAGNOSIS — L03311 Cellulitis of abdominal wall: Secondary | ICD-10-CM

## 2014-12-03 DIAGNOSIS — D630 Anemia in neoplastic disease: Secondary | ICD-10-CM

## 2014-12-03 LAB — CBC
HEMATOCRIT: 20.5 % — AB (ref 36.0–46.0)
Hemoglobin: 6 g/dL — CL (ref 12.0–15.0)
MCH: 25.6 pg — ABNORMAL LOW (ref 26.0–34.0)
MCHC: 29.3 g/dL — ABNORMAL LOW (ref 30.0–36.0)
MCV: 87.6 fL (ref 78.0–100.0)
PLATELETS: 442 10*3/uL — AB (ref 150–400)
RBC: 2.34 MIL/uL — ABNORMAL LOW (ref 3.87–5.11)
RDW: 17.3 % — ABNORMAL HIGH (ref 11.5–15.5)
WBC: 5.5 10*3/uL (ref 4.0–10.5)

## 2014-12-03 LAB — BASIC METABOLIC PANEL
Anion gap: 9 (ref 5–15)
BUN: 11 mg/dL (ref 6–20)
CALCIUM: 8.4 mg/dL — AB (ref 8.9–10.3)
CO2: 28 mmol/L (ref 22–32)
Chloride: 100 mmol/L — ABNORMAL LOW (ref 101–111)
Creatinine, Ser: 0.8 mg/dL (ref 0.44–1.00)
GFR calc Af Amer: >60 mL/min (ref >60)
GFR calc non Af Amer: >60 mL/min (ref >60)
GLUCOSE: 124 mg/dL — AB (ref 70–99)
Potassium: 3.2 mmol/L — ABNORMAL LOW (ref 3.5–5.1)
Sodium: 137 mmol/L (ref 135–145)

## 2014-12-03 LAB — MAGNESIUM: Magnesium: 1.3 mg/dL — ABNORMAL LOW (ref 1.7–2.4)

## 2014-12-03 MED ORDER — POTASSIUM CHLORIDE CRYS ER 20 MEQ PO TBCR
40.0000 meq | EXTENDED_RELEASE_TABLET | Freq: Once | ORAL | Status: AC
  Administered 2014-12-03: 40 meq via ORAL

## 2014-12-03 MED ORDER — POTASSIUM CHLORIDE CRYS ER 20 MEQ PO TBCR
40.0000 meq | EXTENDED_RELEASE_TABLET | Freq: Two times a day (BID) | ORAL | Status: DC
  Administered 2014-12-03 – 2014-12-05 (×3): 40 meq via ORAL
  Filled 2014-12-03 (×8): qty 2

## 2014-12-03 MED ORDER — POTASSIUM CHLORIDE 10 MEQ/100ML IV SOLN
10.0000 meq | Freq: Once | INTRAVENOUS | Status: AC
  Administered 2014-12-03: 10 meq via INTRAVENOUS
  Filled 2014-12-03: qty 100

## 2014-12-03 MED ORDER — POTASSIUM CHLORIDE CRYS ER 20 MEQ PO TBCR: 40.0000 meq | EXTENDED_RELEASE_TABLET | Freq: Two times a day (BID) | ORAL | Status: DC

## 2014-12-03 MED ORDER — TBO-FILGRASTIM 480 MCG/0.8ML ~~LOC~~ SOSY
480.0000 ug | PREFILLED_SYRINGE | Freq: Every day | SUBCUTANEOUS | Status: DC | PRN
  Filled 2014-12-03: qty 0.8

## 2014-12-03 MED ORDER — MAGNESIUM SULFATE 4 GM/100ML IV SOLN
4.0000 g | Freq: Once | INTRAVENOUS | Status: AC
  Administered 2014-12-03: 4 g via INTRAVENOUS
  Filled 2014-12-03: qty 100

## 2014-12-03 MED ORDER — POTASSIUM CHLORIDE 20 MEQ PO PACK
40.0000 meq | PACK | Freq: Once | ORAL | Status: DC
  Filled 2014-12-03: qty 2

## 2014-12-03 MED ORDER — TBO-FILGRASTIM 480 MCG/0.8ML ~~LOC~~ SOSY
480.0000 ug | PREFILLED_SYRINGE | Freq: Every day | SUBCUTANEOUS | Status: DC
  Administered 2014-12-06: 480 ug via SUBCUTANEOUS
  Filled 2014-12-03 (×14): qty 0.8

## 2014-12-03 MED ORDER — MAGNESIUM OXIDE 400 (241.3 MG) MG PO TABS
800.0000 mg | ORAL_TABLET | Freq: Once | ORAL | Status: AC
  Administered 2014-12-03: 800 mg via ORAL
  Filled 2014-12-03: qty 2

## 2014-12-03 NOTE — Progress Notes (Signed)
Critical lab value hgb=6.0. On call  gynecological oncology provider  was paged. No new orders at this time. Pt is asymptomatic. Will continue to monitor.

## 2014-12-03 NOTE — Consult Note (Addendum)
WOC ostomy consult note Stoma type/location:  Pt is familiar to ostomy service from previous recent admission.  She developed cellulitis and pain to her peristomal area approx 2 days ago. Requested to assess for pouching modifications. Husband states he has been independent with pouch application and emptying, but she is only getting a 1-2 day wear time from her pouches before they begin to leak. Stomal assessment/size: Stoma is 1 3/4 inches, red and viable, above skin level.   Peristomal assessment:  Generalized erythremia and edema extends approx 12 cm, beginning at 9:00 o'clock and extends outward to left lower abd.  Skin is beginning to peel in a few patchy areas, revealing pink moist partial thickness skin loss. Purple-dark red area of unknown etiology at 9:00 o'clock next to the base of the stoma, moist with small amt yellow drainage, no odor or fluctuance. Affected area is 1X.3cm, no open wound. Very painful to touch.  There are 2 more areas located at 6:00 o'clock and 7 :00 o'clock on the base of the stoma which have the same appearance and are painful, approx 2X.3cm.  Appearance may be possible pyodermia if it does not resolve when cellulitis improves. Treatment options for stomal/peristomal skin: Pt has completed a CT scan and [facility] is following for assessment and plan of care for cellulitis.   Output: Small amt semi-formed brown stool in pouch. Ostomy pouching: Pt has been wearing a 2 piece system with 2 barrier rings to maintain seal prior to admission. Will try a one piece flexible pouch to see if peristomal areas improve when the ring from the other product is removed, in case it was creating pressure points to the outer stoma edges.  Continue 2 barrier rings to maintain seal.  Will probably require daily pouch changes to avoid leakage while affected areas are moist and macerated.  Discussed plan of care with patient and husband at bedside.  Supplies ordered to room.  [place] team will continue to  assess daily. Julien Girt MSN, RN, [name], [place], [place]

## 2014-12-03 NOTE — Progress Notes (Signed)
[name]  [phone]    HOSPITAL PROGRESS  NOTE  I have seen the patient, examined her and edited the notes as follows  HPI:  [name] is a 59 year old woman With a history of endometrial and ovarian cancer, with the recent admission from 4/19 to 11/25/2014 for resection of tumor as described below, readmitted on 12-02-14 Due to surgical site infection in the peristoma area. Denies fevers, chills, night sweats, vision changes, or mucositis. Denies any respiratory complaints. Denies any chest pain or palpitations.She is somewhat fatigued. She reported left lower extremity swelling, but this was negative for DVT per Ultrasound. Denies nausea, heartburn. She denies loose stools on her colostomy site. Appetite is normal. Denies any dysuria. Denies abnormal neuropathy. Denies any bleeding issues such as epistaxis, hematemesis, hematuria or hematochezia. Ambulating without difficulty. She was noted to be anemic, with a hemoglobin of 6.6, A white count of 4.9, ANC of 1.8, now recovering. Platelets were at 442,000. No transfusion was received to date. Currently, her hemoglobin is 6.0. Ct of the abdomen and pelvis on 5/3 shows likely cellulitis While her surgical issues are being addressed, we were in the informed of the patient's admission.  SUMMARY OF HEMATOLOGIC HISTORY:  She is a retired Marine scientist with chronic idiopathic neutropenia presumed related to previous chemotherapy for 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 Going to the patient, initial diagnosis with ovarian cancer was when she presented with abdomen and no swelling and pelvic pain leading to her urgent care visit. She states that the tumor had caused torsion of the ovary. She cannot remember the stage of her disease. She began to develop leukopenia in approximately July of 2010. She  underwent an initial bone marrow biopsy at [facility] in [place] on 03/17/2009. but it was nondiagnostic. No gross dysplastic changes, no excess blasts, normal cytogenetics.  Her counts have been stable over observation through this office since December 2010 with some minor fluctuations. Total white counts run as low as 1800 and as high as 2900. Percent neutrophils fluctuates very widely from as low as 4% recorded on a CBC done 02/03/2010 to as high as 31% with average being 20%. She was hospitalized in the ICU at that time  Both hemoglobin and platelets have remained stable over time.  Despite leukopenia, she has not had any recurrent or severe infections. She stated strong family history of lupus in a cousin. She complained of occasional skin rash and joint stiffness. 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 She received G-CSF in January of 2015 to stimulate white blood cell production around a colonoscopy procedure a few years ago. She had a limited response. On 01/23/2014, repeat bone marrow biopsy excluded lymphoma or leukemia. On 09/12/2014, CT scan of the abdomen and pelvis show possible relapse of gynecological Malignancy On 10/14/2014, MRI of the pelvis show significant pelvic mass without invasion into the bladder but abutting to the rectum On 11/19/2014, she underwent surgery and had robotic-assisted lysis of adhesions, converted to laparotomy, radical upper vaginectomy and low anterior resection with colostomy. Bilateral ureteral stent placement and cystotomy repair. Pathology Accession: [id number] showed recurrent endometrioid carcinoma with squamous differentiation involving the colonic mucosa and vagina mucosa. Resection margins were negative. She was hospitalized from 11/19/2014 to 11/25/2014. She began  G-CSF injection to keep her [place] greater than 1500. Last dose was on 11/29/14  MEDICATIONS: Scheduled Meds: . acetaminophen  1,000 mg  Oral 3 times per day  . canagliflozin  300 mg Oral q morning - 10a  . clindamycin (CLEOCIN) IV  600 mg Intravenous 4 times per day  . enoxaparin  40 mg Subcutaneous Q24H  . ferrous sulfate  325 mg Oral BID WC  . hydrochlorothiazide  12.5 mg Oral q morning - 10a  . ibuprofen  800 mg Oral TID  . irbesartan  300 mg Oral Daily  . levothyroxine  175 mcg Oral QAC breakfast  . linagliptin  5 mg Oral Daily  . lip balm  1 application Topical BID  . metFORMIN  1,000 mg Oral BID WC  . omega-3 acid ethyl esters  1 g Oral BID  . ondansetron (ZOFRAN) IV  8 mg Intravenous 3 times per day  . pantoprazole  40 mg Oral Daily  . potassium chloride  40 mEq Oral BID  . prenatal multivitamin  1 tablet Oral Q1200  . psyllium  1 packet Oral BID  . rosuvastatin  10 mg Oral QPM  . saccharomyces boulardii  250 mg Oral BID  . sodium chloride  3 mL Intravenous Q12H  . vancomycin  1,000 mg Intravenous Q12H   Continuous Infusions:  PRN Meds:.sodium chloride, alum & mag hydroxide-simeth, HYDROmorphone (DILAUDID) injection, magic mouthwash, menthol-cetylpyridinium, ondansetron, oxyCODONE, phenol, polyethylene glycol, sodium chloride, zolpidem ALLERGIES:   Allergies  Allergen Reactions  . Penicillins Swelling    Facial swelling  . Ultram [Tramadol] Hives  . Adhesive [Tape]     blisters  . Cefaclor Rash    Ceclor  . Erythromycin     Gastritis, abd cramps  . Trimethoprim Rash  . Sulfa Antibiotics Rash     PHYSICAL EXAMINATION:  Filed Vitals:   12/03/14 0613  BP: 103/46  Pulse:   Temp:   Resp:    Filed Weights   12/02/14 1517  Weight: 250 lb (113.399 kg)    GENERAL:alert, no distress and comfortable SKIN: She has cellulitis on her abdominal wall, worse than her previous examination. EYES: normal, conjunctiva are pink and non-injected, sclera clear OROPHARYNX:no exudate, no erythema and lips, buccal mucosa, and tongue normal  NECK: supple, thyroid normal size, non-tender, without  nodularity LYMPH:  no palpable lymphadenopathy in the cervical, axillary or inguinal LUNGS: clear to auscultation and percussion with normal breathing effort HEART: regular rate & rhythm and no murmurs and left lower extremity edema ABDOMEN:abdomen soft, She has incisional tenderness, with an open area at the surgical site, none. This is adjacent to the colostomy site. The JP drain is noted as well. Musculoskeletal:no cyanosis of digits and no clubbing  PSYCH: alert & oriented x 3 with fluent speech NEURO: no focal motor/sensory deficits   LABORATORY/RADIOLOGY DATA:   Recent Labs Lab 11/29/14 1251 12/02/14 1731 12/03/14 0408  WBC 2.1* 4.9 5.5  HGB 7.2* 6.6* 6.0*  HCT 23.4* 22.7* 20.5*  PLT 548* 537* 442*  MCV 88.0 87.3 87.6  MCH 27.1 25.4* 25.6*  MCHC 30.8* 29.1* 29.3*  RDW 17.5* 17.4* 17.3*  LYMPHSABS 0.6* 1.5  --   MONOABS 0.8 1.2*  --   EOSABS 0.2 0.3  --   BASOSABS 0.0 0.1  --     CMP    Recent Labs Lab 11/29/14 1251 12/02/14 1731 12/03/14 0408  NA 140 138 137  K 3.5 2.8* 3.2*  CL  --  103 100*  CO2 30* 29 28  GLUCOSE 168* 130* 124*  BUN 11.2 11 11  CREATININE 0.7 0.67 0.80  CALCIUM 9.3 9.1 8.4*  MG  --  1.3* 1.3*  AST 15  --   --   ALT 17  --   --   ALKPHOS 77  --   --   BILITOT 0.34  --   --         Component Value Date/Time   BILITOT 0.34 11/29/2014 1251   BILITOT 0.6 11/07/2014 1100    Anemia panel:   No results for input(s): VITAMINB12, FOLATE, FERRITIN, TIBC, IRON, RETICCTPCT in the last 72 hours.  No results for input(s): TSH, T4TOTAL, T3FREE, THYROIDAB in the last 72 hours.  Invalid input(s): FREET3      Component Value Date/Time   ESRSEDRATE 43* 07/21/2009 1455    No results for input(s): INR, PROTIME in the last 168 hours.    Urinalysis    Component Value Date/Time   COLORURINE YELLOW 11/07/2014 1017   APPEARANCEUR CLEAR 11/07/2014 1017   LABSPEC 1.015 11/11/2014 1220   LABSPEC 1.026 11/07/2014 1017   PHURINE 5.0  11/07/2014 1017   GLUCOSEU 2000 11/11/2014 1220   GLUCOSEU >1000* 11/07/2014 1017   HGBUR NEGATIVE 11/07/2014 1017   BILIRUBINUR NEGATIVE 11/07/2014 1017   KETONESUR NEGATIVE 11/07/2014 1017   PROTEINUR NEGATIVE 11/07/2014 1017   UROBILINOGEN 0.2 11/11/2014 1220   UROBILINOGEN 0.2 11/07/2014 1017   NITRITE Negative 11/11/2014 1220   NITRITE NEGATIVE 11/07/2014 1017   LEUKOCYTESUR NEGATIVE 11/07/2014 1017    Drugs of Abuse  No results found for: LABOPIA, COCAINSCRNUR, LABBENZ, AMPHETMU, THCU, LABBARB   Liver Function Tests:  Recent Labs Lab 11/29/14 1251  AST 15  ALT 17  ALKPHOS 77  BILITOT 0.34  PROT 6.4  ALBUMIN 2.6*   No results for input(s): LIPASE, AMYLASE in the last 168 hours. No results for input(s): AMMONIA in the last 168 hours.  CBG:  Recent Labs Lab 12/02/14 1736  GLUCAP 106*   Hgb A1c No results for input(s): HGBA1C in the last 72 hours.   D-Dimer No results for input(s): DDIMER in the last 72 hours.  Thyroid function studies No results for input(s): TSH, T4TOTAL, T3FREE, THYROIDAB in the last 72 hours.  Invalid input(s): [place]  Radiology Studies:  Ct Abdomen Pelvis W Contrast  12/03/2014   COMPARISON:  MRI 10/14/2014  FINDINGS: In the upper abdomen, there is soft tissue gas at the superficial aspect of the abdominal wall musculature in the right upper quadrant, left upper quadrant and right para midline epigastric region. No significant inflammatory changes or fluid are evident in these areas.  In the abdominal left lower quadrant there is a mid descending colostomy. There is inflammatory stranding in the subcutaneous soft tissues around the ostomy. There is soft tissue gas at the lateral aspect of the stoma, but no evidence of a drainable collection.  There is a small volume peritoneal fluid in the right pericolic gutter, but no evidence of a contained fluid collection or abscess in the abdomen or pelvis. There is is a peritoneal drain which does  not pass through this portion of the right pericolic gutter. There are bilateral ureteral stents. There is mild bilateral hydronephrosis.  There are normal appearances of the liver, with no evidence of hepatic metastasis. There are normal appearances of the spleen, pancreas, and adrenals. Stomach and small bowel appear unremarkable. The oral contrast has passed through to the distal ileum with no evidence of bowel obstruction.  There is no abdominal adenopathy. There is postoperative stranding in the pelvis, particularly  in the presacral region.  There is no significant abnormality in the lower chest.  IMPRESSION: *Inflammatory stranding and soft tissue gas around the left lower quadrant ostomy, particularly at its lateral aspect. No drainable collection. This likely represents cellulitis. *In the upper abdomen, there is soft tissue gas at the superficial aspect of the abdominal wall musculature without significant inflammatory change or fluid. This might relate to the recent surgery but cannot be characterized. *Small volume peritoneal fluid in the right pericolic gutter. The peritoneal JP drain does not pass through this portion of the gutter. No contained collection or abscess is evident. *Mild bilateral hydronephrosis. Satisfactorily positioned ureteral stents bilaterally.   Electronically Signed   By: Andreas Newport M.D.   On: 12/03/2014 01:38   Dg Chest Port 1 View  11/20/2014   COMPARISON:  08/25/2014  FINDINGS: There is an endotracheal tube with tip between the clavicular heads and carina (tip 18 mm above the carina). There is hypoventilation with interstitial crowding. No edema, effusion, or air leak. Normal heart size and aortic contours.  IMPRESSION: 1. The endotracheal tube is in good position. 2. Hypoventilation.   Electronically Signed   By: Monte Fantasia M.D.   On: 11/20/2014 05:37   ASSESSMENT AND PLAN:  Chronic neutropenia The patient responded well to G-CSF. There is no role for  IVIG. She will get close CBC monitoring daily due to recent infection above. She will receive a dose of Granix whenever her ANC is less than 1500.   History of ovarian cancer She is recovering well from recent surgical resection. Will defer to her gynecologist oncologist to determine the best course of adjuvant treatment for her.  Incisional infection of surgical site She has skin infection around her surgical site, requiring admission. CT of the abdomen and pelvis on 12/03/2014 was remarkable for cellulitis in the region, requiring IV antibiotics. She is on at appropriate antibiotics. She also has received G-CSF injections, last on 4/29, and she will continue on injection as needed to prevent life-threatening infection.  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 Her Hb is 6.0. She is not symptomatic. She is responding well with great reticulocytosis with oral iron supplement. Recommend she continues to same for the next few months I saw she can tolerate oral iron supplement well. If she cannot tolerate that further, I can also prescribe intravenous iron infusion.  Reactive thrombocytosis She has signs of reactive thrombocytosis to recent postsurgical blood loss, mild iron deficiency and recent mild infection around the stoma. Recommend observation only and to continue on oral iron supplement  DVT prophylaxis On mechanical devices  Left lower extremity edema Left lower extremity  ultrasound, is negative for DVT.  Continue to monitor.  Full code  **Disclaimer: This note was dictated with voice recognition software. Similar sounding words can inadvertently be transcribed and this note may contain transcription errors which may not have been corrected upon publication of note.Sharene Butters E, PA-C 12/03/2014, 12:53 PM Etana Beets, MD 12/03/2014

## 2014-12-03 NOTE — Progress Notes (Signed)
INPATIENT PROGRESS NOTE  Assessment:  59 y.o. year old POD 9 s/p recurrent endometrioid endometrial/ovarian cancer s/p exploratory laparotomy, posterior supralevator pelvic exenteration, end colostomy, bladder repair, ureteral stenting on 11/09/14, now with surgical site infection peri-stomal in setting of chronic neutropenia. Clinically stable with no signs of sepsis or SIRS. Left lower extremity edema (new).  Plan: 1/ Wound infection/cellulitis: admit to hospital of IV antibiotics. She is stable. Slight regression. However, given her underlying neutropenia, I am recommending IV antibiotics. Appreciate rec's from Dr Johney Maine to use [facility] and [facility].  Draw blood cultures if spikes temp >100.4. CT abdo/pelvis showed no abscess. Mark edge of erythema and monitor for progression/regression  2/ Chronic neutropenia (idiopathic): Dr Alvy Bimler is followup. Appreciate this. Granix prn pending WBC and clinical picture.  3/ Complex bladder repair with ureteral stents, foley placement and JP drain: check JP creatinine today. If normal, will pull JP. Continue foley catheter. Do not remove. Plan on retrograde cystogram in 1-2 weeks. Stent removal 6 weeks postop pending CT urogram results.  4/ Left lower extremity edema: possible DVT. Patient is on anticoagulation (lovenox) prophylaxis. Will obtain LE dopplers and change to therapeutic lovenox if DVT identified.  5/ Acute blood loss anemia: will reassess CBC. On FeSO4 - continue. Offered patient transfusion. She is declining for now as she feels well.  6/ type II DM: continue home meds.  7/ hypomagnesemia, hypokalemia- will replete. Can take oral supplements.   HPI: Kinslei Raether is a 59 y.o. 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 She then underwent a posterior supralevator exenteration with colostomy and bladder repair and stent  placement on [id number] without complications. Her postoperative course was uncomplicate with the exception of development of postop anemia. Her final pathology revealed endometrioid adenocarcinoma invading the vagina and rectum with negative margins on the specimen. The margin had been close (clinically) to the right pelvic sidewall which was m.  She is seen today for a postoperative check and to discuss her pathology results and ongoing plan. Since discharge from the hospital, she is feeling generally well. She has improving appetite, functioning stoma, and pain controlled with minimal PO medication. She has had increasing temp's particularly at night with a 100.0 temp last night (others in the 99's). She notices increased redness and discomfort around her stoma in past 48 hours and hardness underlying it.   Review of systems: Constitutional: She has no weight gain or weight loss. She has a low grade fever no chills. Eyes: No blurred vision Ears, Nose, Mouth, Throat: No dizziness, headaches or changes in hearing. No mouth sores. Cardiovascular: No chest pain, palpitations or edema. Respiratory: No shortness of breath, wheezing or cough Gastrointestinal: She has normal bowel movements trhough stoma without diarrhea or constipation. She denies any nausea or vomiting. She denies blood in her stool or heart burn. Genitourinary: She denies pelvic pain, pelvic pressure or changes in her urinary function. She has no hematuria, dysuria, or incontinence (foley in situ). She has no irregular vaginal bleeding or vaginal discharge Musculoskeletal: Denies muscle weakness or joint pains.  Skin: She has no skin changes, rashes or itching Neurological: Denies dizziness or headaches. No neuropathy, no numbness or tingling. Psychiatric: She denies depression or anxiety. Hematologic/Lymphatic: No easy bruising or bleeding  Allergies  Allergen Reactions  . Penicillins Swelling    Facial  swelling  . Ultram [Tramadol] Hives  . Adhesive [Tape]     blisters  .  Cefaclor Rash    Ceclor  . Erythromycin     gastritis  . Trimethoprim Rash  . Sulfa Antibiotics Rash    Current Facility-Administered Medications on File Prior to Encounter  Medication Dose Route Frequency Provider Last Rate Last Dose  . Tbo-Filgrastim (GRANIX) injection 480 mcg 480 mcg Subcutaneous Weekly Heath Lark, MD  480 mcg at 11/29/14 1338   Current Outpatient Prescriptions on File Prior to Encounter  Medication Sig Dispense Refill  . acetaminophen (TYLENOL) 500 MG tablet Take 2 tablets (1,000 mg total) by mouth 3 (three) times daily. 30 tablet 0  . Calcium Carbonate-Vitamin D (CALCIUM + D PO) Take 2 tablets by mouth daily.    . Canagliflozin (INVOKANA) 300 MG TABS Take 1 tablet by mouth every morning.     . Cholecalciferol (VITAMIN D3) 10000 UNITS capsule Take 10,000 Units by mouth once a week. Sundays    . docusate sodium (COLACE) 100 MG capsule Take 100 mg by mouth daily.    [name] Kitchen enoxaparin (LOVENOX) 40 MG/0.4ML injection Inject 0.4 mLs (40 mg total) into the skin daily. 21 Syringe 1  . ferrous sulfate 325 (65 FE) MG tablet Take 1 tablet (325 mg total) by mouth 2 (two) times daily with a meal. 60 tablet 3  . hydrochlorothiazide (HYDRODIURIL) 12.5 MG tablet Take 12.5 mg by mouth every morning.     [name] Kitchen ibuprofen (ADVIL,MOTRIN) 800 MG tablet     . levothyroxine (SYNTHROID) 175 MCG tablet Take 175 mcg by mouth daily before breakfast.    . metFORMIN (GLUCOPHAGE) 1000 MG tablet Take 1,000 mg by mouth 2 (two) times daily with a meal.     . Multiple Vitamin (MULTIVITAMIN WITH MINERALS) TABS tablet Take 1 tablet by mouth daily.    [name] Kitchen omega-3 acid ethyl esters (LOVAZA) 1 G capsule Take 1 g by mouth 2 (two) times daily.    [name] Kitchen omeprazole (PRILOSEC) 20 MG capsule Take 20 mg by mouth daily.    .  ondansetron (ZOFRAN) 4 MG tablet Take 1 tablet (4 mg total) by mouth every 6 (six) hours as needed for nausea. 20 tablet 0  . ONETOUCH VERIO test strip     . oxyCODONE (OXY IR/ROXICODONE) 5 MG immediate release tablet Take 1-2 tablets (5-10 mg total) by mouth every 4 (four) hours as needed for moderate pain, severe pain or breakthrough pain. 30 tablet 0  . Polyethyl Glycol-Propyl Glycol (SYSTANE OP) Apply 1 drop to eye daily as needed (Dry eyes).     [name] Kitchen PROVENTIL HFA 108 (90 BASE) MCG/ACT inhaler   0  . rosuvastatin (CRESTOR) 10 MG tablet Take 10 mg by mouth every evening.     . sitaGLIPtin (JANUVIA) 100 MG tablet Take 100 mg by mouth daily.    . Tbo-Filgrastim (GRANIX) 480 MCG/0.8ML SOSY injection Inject 480 mcg into the skin once a week.    . traMADol (ULTRAM) 50 MG tablet   0  . valsartan (DIOVAN) 320 MG tablet Take 320 mg by mouth every morning.      Past Medical History  Diagnosis Date  . Hypothyroidism 12/14/2011  . Benign essential HTN 12/14/2011  . Neutropenia   . Postmenopausal bleeding 09/05/2014  . Anemia     Planned Neupogen injections for chronic neutropenia  . Transfusion history     '06-s/p hysterectomy and staging  . Multiple thyroid nodules y-8    Managed by Dr. Harlow Asa  . GERD (gastroesophageal reflux disease)   . Polyp of duodenum y-2  . DM type 2 (  diabetes mellitus, type 2) 12/14/2011  . Dysrhythmia     skipped beat with occasional coffee   . Hx of bronchitis   . Depression   . Hiatal hernia y-12    hx of small hiatal hernia   . Neutropenia     chronic   . Cancer     '06-Ovarian/ Uterine Cancer-chemotherapy  . 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 Appendectomy    . Tonsillectomy    . Cholecystectomy      laparoscopic  . Eye surgery      pytosis of  eyelids-child  . Colonoscopy with propofol N/A 08/21/2013    Procedure: COLONOSCOPY WITH PROPOFOL; Surgeon: Cleotis Nipper, MD; Location: [facility]; Service: Endoscopy; Laterality: N/A;  . Abdominal hysterectomy      staging for Ovarian cancer  . Eus N/A 10/02/2014    Procedure: LOWER ENDOSCOPIC ULTRASOUND (EUS); Surgeon: Arta Silence, MD; Location: [facility]; Service: Endoscopy; Laterality: N/A;  . Robotic assisted lap vaginal hysterectomy N/A 11/19/2014    Procedure: ROBOTIC LYSIS OF ADHESIONS, CONVERTED TO LAPAROTOMY RADICAL UPPER VAGINECTOMY,LOW ANTERIOR BOWEL RESECTION, COLOSTOMY, BILATERAL URETERAL STENT PLACEMENT AND CYSTONOMY CLOSURE; Surgeon: Everitt Amber, MD; Location: [facility]; Service: Gynecology; Laterality: N/A;  . Ostomy N/A 11/19/2014    Procedure: OSTOMY; Surgeon: Michael [name], MD; Location: [facility]; Service: General; Laterality: N/A;    Family History  Problem Relation Age of Onset  . Cancer Mother     stomach ca  . Hypertension Mother   . Cancer Father     prostate ca  . Diabetes Father   . Diabetes Sister   . Hypertension Brother y-10    History   Social History  . Marital Status: Married    Spouse Name: N/A  . Number of Children: N/A  . Years of Education: N/A   Occupational History  . Not on file.   Social History Main Topics  . Smoking status: Never Smoker   . Smokeless tobacco: Never Used  . Alcohol Use: Yes     Comment: rare social  . Drug Use: No  . Sexual Activity: Not Currently   Other Topics Concern  . Not on file   Social History Narrative   CBC    Component Value Date/Time   WBC 5.5 12/03/2014 0408   WBC 2.1* 11/29/2014 1251   WBC 2.4* 08/25/2014 1541   RBC 2.34* 12/03/2014 0408   RBC 2.66* 11/29/2014 1251   RBC 4.75 08/25/2014 1541   HGB 6.0* 12/03/2014 0408   HGB 7.2* 11/29/2014 1251    HGB 13.0 08/25/2014 1541   HCT 20.5* 12/03/2014 0408   HCT 23.4* 11/29/2014 1251   HCT 41.3 08/25/2014 1541   PLT 442* 12/03/2014 0408   PLT 548* 11/29/2014 1251   MCV 87.6 12/03/2014 0408   MCV 88.0 11/29/2014 1251   MCV 86.8 08/25/2014 1541   MCH 25.6* 12/03/2014 0408   MCH 27.1 11/29/2014 1251   MCH 27.5 08/25/2014 1541   MCHC 29.3* 12/03/2014 0408   MCHC 30.8* 11/29/2014 1251   MCHC 31.6* 08/25/2014 1541   RDW 17.3* 12/03/2014 0408   RDW 17.5* 11/29/2014 1251   LYMPHSABS 1.5 12/02/2014 1731   LYMPHSABS 0.6* 11/29/2014 1251   MONOABS 1.2* 12/02/2014 1731   MONOABS 0.8 11/29/2014 1251   EOSABS 0.3 12/02/2014 1731   EOSABS 0.2 11/29/2014 1251   BASOSABS 0.1 12/02/2014 1731   BASOSABS 0.0 11/29/2014 1251    BMET  Component Value Date/Time   NA 137 12/03/2014 0408   NA 140 11/29/2014 1251   K 3.2* 12/03/2014 0408   K 3.5 11/29/2014 1251   CL 100* 12/03/2014 0408   CO2 28 12/03/2014 0408   CO2 30* 11/29/2014 1251   GLUCOSE 124* 12/03/2014 0408   GLUCOSE 168* 11/29/2014 1251   BUN 11 12/03/2014 0408   BUN 11.2 11/29/2014 1251   CREATININE 0.80 12/03/2014 0408   CREATININE 0.7 11/29/2014 1251   CREATININE 0.84 08/25/2014 1538   CALCIUM 8.4* 12/03/2014 0408   CALCIUM 9.3 11/29/2014 1251   GFRNONAA >60 12/03/2014 0408   GFRNONAA 76 08/25/2014 1538   GFRAA >60 12/03/2014 0408   GFRAA 87 08/25/2014 1538      Physical Exam: Blood pressure 136/60, pulse 94, temperature 98.2 F (36.8 C), temperature source Oral, resp. rate 18, height 5\' 2"  (1.575 m), weight 252 lb 12.8 oz (114.669 kg). General: Well dressed, well nourished in no apparent distress.  HEENT: Normocephalic and atraumatic, no lesions. Extraocular muscles intact. Sclerae anicteric. Pupils equal, round, reactive. No mouth sores or ulcers. Thyroid is normal size, not nodular, midline. Skin: No lesions or rashes. Breasts: deferred Lungs: Clear to auscultation bilaterally. No  wheezes. Cardiovascular: Regular rate and rhythm. No murmurs or rubs. Abdomen: Soft, nontender, nondistended. No palpable masses. No hepatosplenomegaly. No ascites. Normal bowel sounds. No hernias. Incision is healing with no drainage or erythema. No drainage. To the left of the colostomy (lateral border) is blanching erythema and underlying firmness of the subQ tissue, consistent with cellulitis. Receded (improved) from previously marked edge of erythema. No underlying fluctuance Genitourinary: deferred Extremities: No cyanosis, clubbing. LLE edema (L>R). No calf tenderness or erythema. No palpable cords. Psychiatric: Mood and affect are appropriate. Neurological: Awake, alert and oriented x 3. Sensation is intact, no neuropathy.  Musculoskeletal: No pain, normal strength and range of motion.  Donaciano Eva, MD      Routing History

## 2014-12-03 NOTE — Consult Note (Addendum)
WOC follow-up: Called back to patient's room R/T leaking ostomy pouch.  Pt is in pain and crying since skin is so tender to the touch.  While cleaning the skin and preparing to apply the barrier ring and pouch, stool was noted to be actively propelling out of the peristomal skin area at 3:00 o'clock where the previously mentioned purple maceration is located; stool is also coming out of the os at this time.  Pt states this location is where all her previous ostomy pouches have been leaking.  Dr Johney Maine called and notified of the new development; pt took a photo on her phone to show him when he makes rounds.  Applied barrier ring and medium Eakin pouch with the opening cut large enough for the stool to leak out of the side of the stoma also.  Extra supplies ordered for the bedside nurse to use if leakage occurs. Julien Girt MSN, RN, [place], [place], [place]

## 2014-12-03 NOTE — Progress Notes (Signed)
Six runs of potassium ordered. Pt has peripheral IV and was only able to tolerate potassium at a rate of 60 ml/hr. Potassium expired on MAR. 5/6 bags was administered to patient. Last bag will be administered on day shift. Pt complain of mild pain, stated "it's tolerable."

## 2014-12-03 NOTE — Progress Notes (Signed)
*  PRELIMINARY RESULTS* Vascular Ultrasound Lower extremity venous duplex has been completed.  Preliminary findings: Negative for DVT  Landry Mellow, RDMS, RVT  12/03/2014, 9:53 AM

## 2014-12-03 NOTE — Progress Notes (Signed)
Patient with stool leaking at left lateral edge of colostomy at ~9 o'clock - less induration LUQ of colostomy now after release of stool from SQ.  Erythema less Colostomy pink at surface with fullness left colon wall/SQ.  No torsion/twisting/obstruction.  No internal opening in side wall felt.  Difficult to assess with new Eakins pouch in place.  Small opening 9 o'clock edge of probed 5cm into SQ.  No more stool/pus released.  Will continue Abx.  Will need packing of wound - Try 1/4 into SQ NU Gauze at new ostomy pouch change.    If that is not not successful may need OR exploration with drainage/washout, r/o deeper leak/fistula, closed drainage lateral to colostomy pouch.  If not better after those 2 options, OR to relocate colostomy to LUQ or RUQ & leave old colostomy wound open w packing.

## 2014-12-03 NOTE — Progress Notes (Signed)
[facility]  [address] [place], [place] [address] Phone: [phone] FAX: [phone]    Ron Junco [id number] [birth year]  CARE TEAM:  PCP: Leana Gamer., MD  Outpatient Care Team: Patient Care Team: Leana Gamer, MD as PCP - General (Internal Medicine) [name], MD as Referring Physician (Obstetrics and Gynecology) Heath Lark, MD as Consulting Physician (Hematology and Oncology) Everitt Amber, MD as Consulting Physician (Obstetrics and Gynecology) Michael [name], MD as Consulting Physician (General Surgery)  Inpatient Treatment Team: Treatment Team: Attending Provider: Everitt Amber, MD; Consulting Physician: Michael [name], MD; Technician: Carolyn Stare, NT; Registered Nurse: Ludwig Lean, RN; Technician: Suzanna Obey, NT; Respiratory Therapist: Lita Mains, RRT  Problem List:   Principal Problem:   Cellulitis of left abdominal wall near colostomy Active Problems:   Chronic neutropenia      Procedure(s): POST-OPERATIVE DIAGNOSIS: 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 TWO LYMPH NODES, NEGATIVE FOR ATYPIA OR MALIGNANCY (0/2). RESECTION MARGINS, NEGATIVE FOR ATYPIA OR MALIGNANCY.  Microscopic Comment Please see Kamaree Berkel description for details. [name] Pathologist, Electronic Signature (Case signed 11/21/2014) Specimen Oaklie Durrett and Clinical  Information Specimen(s) Obtained: Colon, segmental resection for tumor, rectosigmoid colon with upper vagina Specimen Clinical Information endometrial cancer (kp) Diavian Furgason The specimen is received in formalin labeled rectosigmoid colon and upper vagina, and consists of an 18.1 cm in length portion of colon, with the proximal margin stapled and the distal margin open. On the anterior aspect of the colon, there is a 2.7 x 2.6 cm slightly disrupted circular portion of gray-brown mucosa designated by a stitch, which is clinically stated as posterior vaginal wall. The resection margin surrounding the vaginal tissue is inked black, and the distal resection margin is inked black for orientation. The soft tissue surrounding the vaginal tissue along the right side is slightly firm. The lumen of the colon contains a small amount of green-brown fecal material, and the mucosa is tan-pink with normal folding. No mucosal lesions are grossly identified, however, palpating the colon, which overlies the indurated adipose tissue reveals a firm nodule within the possible submucosa and muscularis. The nodule measures approximately 0.6 cm, and is 2.9 cm from the distal resection margin. Sectioning through the distal aspect of the colon and underlying soft tissue reveals a 4.6 x 4.5 x 4.0 cm tan-red, hemorrhagic, softened lesion within the 1 of 2 FINAL for Sporn, [place] ([id number]) Envi Eagleson(continued) underlying adipose tissue. The lesion invades into the overlying muscularis propria at the area previously noted. The colon wall measures 0.5 cm in thickness. Three tan-pink possible lymph nodes are identified, ranging from 0.2 cm to 0.5 cm in greatest dimension. Please note the patient is status post chemotherapy. Representative sections are submitted in twelve cassettes. A = proximal resection margin. B, C = distal resection margin. D - F = lesion to overlying colon. G, H = lesion. I, J = vaginal mucosa. K =  uninvolved mucosa. L = three possible lymph nodes. (KL:ecj 11/20/2014) Report signed out from the following location(s) Technical component and interpretation was performed at [facility]  Cellulitis lateral to colostomy, stable   Plan:  IV ABx.  Vanco/Clindamycin with ### ABx allergies.  Hopefully will retract in next 1-2 days.    CT scan c/w cellutlitis & NOT abscess.  Suspect fat necrosis and/or hematoma w cellulitis.  Stoma pink = doubt deeper leak/ischemia/perforation.  The patient is stable.  There is no evidence of peritonitis, acute abdomen, nor shock.  There is no strong evidence of failure of improvement nor decline with current non-operative management.  There is no need for surgery at the present moment.  We will continue to follow.  Anemia.  Low Hgb - follow - transfuse if Hgb <7 and symptomatic if GynOnc Dr Rossi/LJM agree.  ?Reinvolve Hematology/oncology - CSF to help stim BMarrow?  Pt asymptomatic.  Dr Denman [name] offered transfusion.  Pt wishes to wait.  Low K - replace & check Mg  Pain control -  Continue tylenol RTC.  PO oxycodone w IV Dilaudid backup.  Hold NSAIDs with falling Hgb  Colostomy care.  Get WOCN involved.  No leak/ moisture = probably no severe problem but check  Foley - DO NOT REMOVE due to bladder repair & ureteral stenting.  She had complex cystotomy repair (trigone and base) and ureteral stenting. DO NOT REMOVE FOLEY CATHETER - will perform retrograde cystogram in 12May will d.c foley at that time IF no leak noted.   DRAIN:  Per Dr Denman [name] / Gyn Onc, check JP creatinine prior to removing drain (if elevated above serum levels would keep JP in until foley comes out/bladder integrity confirmed on imaging). Remove ureteral stents at 6 weeks if no stricture/leak on CT urogram.  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 TWO LYMPH NODES, NEGATIVE FOR ATYPIA OR  MALIGNANCY (0/2). I am skeptical that only 2 LN were found in 18cm of intact mesorectum & mesocolon although not a colon adenoCA = may not be usually drainage point for the recurrent endometriod CA  RESECTION MARGINS, NEGATIVE FOR ATYPIA OR MALIGNANCY.  Glc control - ? SSI.  Restarting PO hypoglycemics  HTN - OK off meds - restart gradually.  GERD - protonix daily  -VTE prophylaxis- SCDs, etc  -mobilize as tolerated to help recovery    I updated the patient's status to the patient & family.  D/we Dr Denman [name].  Recommendations were made.  Questions were answered.  The patient expressed understanding & appreciation.   [name], M.D., F.A.C.S. Gastrointestinal and Minimally Invasive Surgery [facility] [address], [place], [place] [address] [phone] Main / Paging   12/03/2014  Subjective:  3W onc floor Pain controlled No n/v.  Tol solids Walked in hallways  Objective:  Vital signs:  Filed Vitals:   12/02/14 1517 12/02/14 2011 12/03/14 0503 12/03/14 0613  BP: 153/78 149/77 98/44 103/46  Pulse: 86 80 68   Temp: 98.1 F (36.7 C) 98 F (36.7 C) 97.5 F (36.4 C)   TempSrc: Oral Oral Oral   Resp: 16 16 16    Height: 5' 2"  (1.575 m)     Weight: 113.399 kg (250 lb)     SpO2: 100% 100% 100%     Last BM Date: 12/02/14  Intake/Output   Yesterday:  05/02 0701 - 05/03 0700 In: -  Out: 1330 [Urine:1300; Drains:30] This shift:     Bowel function:  Flatus: large volume  BM: yes  Drain: thinly serosanguinous  Physical Exam:  General: Pt awake/alert/oriented in  no acute distress.  Smiling, chatty Eyes: PERRL, normal EOM.  Sclera clear.  No icterus Neuro: CN II-XII intact w/o focal sensory/motor deficits. Lymph: No head/neck/groin lymphadenopathy Psych:  No delerium/psychosis/paranoia HENT: Normocephalic, Mucus membranes moist.  No thrush.  ETT in place Neck: Supple, No tracheal deviation Chest: No chest wall pain w  good excursion CV:  Pulses intact.  Regular rhythm MS: Normal AROM mjr joints.  No obvious deformity Abdomen: Soft.  Nondistended.  Incision closed.   Mildly tender at LUQ of colostomy w 6x6cm induration & 20x15cm erythema.  Stable from last night.  No evidence of peritonitis.  No incarcerated hernias. Ext:  SCDs BLE.  No mjr edema.  No cyanosis Skin: No petechiae / purpura  Results:   Labs: Results for orders placed or performed during the hospital encounter of 12/02/14 (from the past 48 hour(s))  Basic metabolic panel     Status: Abnormal   Collection Time: 12/02/14  5:31 PM  Result Value Ref Range   Sodium 138 135 - 145 mmol/L   Potassium 2.8 (L) 3.5 - 5.1 mmol/L   Chloride 103 101 - 111 mmol/L   CO2 29 22 - 32 mmol/L   Glucose, Bld 130 (H) 70 - 99 mg/dL   BUN 11 6 - 20 mg/dL   Creatinine, Ser 0.67 0.44 - 1.00 mg/dL   Calcium 9.1 8.9 - 10.3 mg/dL   GFR calc non Af Amer >60 >60 mL/min   GFR calc Af Amer >60 >60 mL/min    Comment: (NOTE) The eGFR has been calculated using the CKD EPI equation. This calculation has not been validated in all clinical situations. eGFR's persistently <90 mL/min signify possible Chronic Kidney Disease.    Anion gap 6 5 - 15  CBC WITH DIFFERENTIAL     Status: Abnormal   Collection Time: 12/02/14  5:31 PM  Result Value Ref Range   WBC 4.9 4.0 - 10.5 K/uL   RBC 2.60 (L) 3.87 - 5.11 MIL/uL   Hemoglobin 6.6 (LL) 12.0 - 15.0 g/dL    Comment: REPEATED TO VERIFY CRITICAL RESULT CALLED TO, READ BACK BY AND VERIFIED WITH: F HERDON RN @1810  5.2.16 BY A MORRIS    HCT 22.7 (L) 36.0 - 46.0 %   MCV 87.3 78.0 - 100.0 fL   MCH 25.4 (L) 26.0 - 34.0 pg   MCHC 29.1 (L) 30.0 - 36.0 g/dL   RDW 17.4 (H) 11.5 - 15.5 %   Platelets 537 (H) 150 - 400 K/uL   Neutrophils Relative % 37 (L) 43 - 77 %   Lymphocytes Relative 31 12 - 46 %   Monocytes Relative 24 (H) 3 - 12 %   Eosinophils Relative 6 (H) 0 - 5 %   Basophils Relative 2 (H) 0 - 1 %   Neutro Abs 1.8 1.7 -  7.7 K/uL   Lymphs Abs 1.5 0.7 - 4.0 K/uL   Monocytes Absolute 1.2 (H) 0.1 - 1.0 K/uL   Eosinophils Absolute 0.3 0.0 - 0.7 K/uL   Basophils Absolute 0.1 0.0 - 0.1 K/uL   RBC Morphology POLYCHROMASIA PRESENT     Comment: RARE NRBCs   WBC Morphology MILD LEFT SHIFT (1-5% METAS, OCC MYELO, OCC BANDS)     Comment: TOXIC GRANULATION  Magnesium     Status: Abnormal   Collection Time: 12/02/14  5:31 PM  Result Value Ref Range   Magnesium 1.3 (L) 1.7 - 2.4 mg/dL  Glucose, capillary     Status: Abnormal   Collection  Time: 12/02/14  5:36 PM  Result Value Ref Range   Glucose-Capillary 106 (H) 70 - 99 mg/dL   Comment 1 Notify RN    Comment 2 Document in Chart   Basic metabolic panel     Status: Abnormal   Collection Time: 12/03/14  4:08 AM  Result Value Ref Range   Sodium 137 135 - 145 mmol/L   Potassium 3.2 (L) 3.5 - 5.1 mmol/L   Chloride 100 (L) 101 - 111 mmol/L   CO2 28 22 - 32 mmol/L   Glucose, Bld 124 (H) 70 - 99 mg/dL   BUN 11 6 - 20 mg/dL   Creatinine, Ser 0.80 0.44 - 1.00 mg/dL   Calcium 8.4 (L) 8.9 - 10.3 mg/dL   GFR calc non Af Amer >60 >60 mL/min   GFR calc Af Amer >60 >60 mL/min    Comment: (NOTE) The eGFR has been calculated using the CKD EPI equation. This calculation has not been validated in all clinical situations. eGFR's persistently <90 mL/min signify possible Chronic Kidney Disease.    Anion gap 9 5 - 15  CBC     Status: Abnormal   Collection Time: 12/03/14  4:08 AM  Result Value Ref Range   WBC 5.5 4.0 - 10.5 K/uL   RBC 2.34 (L) 3.87 - 5.11 MIL/uL   Hemoglobin 6.0 (LL) 12.0 - 15.0 g/dL    Comment: REPEATED TO VERIFY CRITICAL RESULT CALLED TO, READ BACK BY AND VERIFIED WITH: S PADDEN AT 0447 ON 05.03.16 BY G KONTOS    HCT 20.5 (L) 36.0 - 46.0 %   MCV 87.6 78.0 - 100.0 fL   MCH 25.6 (L) 26.0 - 34.0 pg   MCHC 29.3 (L) 30.0 - 36.0 g/dL   RDW 17.3 (H) 11.5 - 15.5 %   Platelets 442 (H) 150 - 400 K/uL    Imaging / Studies: Ct Abdomen Pelvis W  Contrast  12/03/2014   CLINICAL DATA:  Recurrent endometrial carcinoma. Stomal infection in setting of chronic neutropenia.  EXAM: CT ABDOMEN AND PELVIS WITH CONTRAST  TECHNIQUE: Multidetector CT imaging of the abdomen and pelvis was performed using the standard protocol following bolus administration of intravenous contrast.  CONTRAST:  86m OMNIPAQUE IOHEXOL 300 MG/ML SOLN, 1019mOMNIPAQUE IOHEXOL 300 MG/ML SOLN  COMPARISON:  MRI 10/14/2014  FINDINGS: In the upper abdomen, there is soft tissue gas at the superficial aspect of the abdominal wall musculature in the right upper quadrant, left upper quadrant and right para midline epigastric region. No significant inflammatory changes or fluid are evident in these areas.  In the abdominal left lower quadrant there is a mid descending colostomy. There is inflammatory stranding in the subcutaneous soft tissues around the ostomy. There is soft tissue gas at the lateral aspect of the stoma, but no evidence of a drainable collection.  There is a small volume peritoneal fluid in the right pericolic gutter, but no evidence of a contained fluid collection or abscess in the abdomen or pelvis. There is is a peritoneal drain which does not pass through this portion of the right pericolic gutter. There are bilateral ureteral stents. There is mild bilateral hydronephrosis.  There are normal appearances of the liver, with no evidence of hepatic metastasis. There are normal appearances of the spleen, pancreas, and adrenals. Stomach and small bowel appear unremarkable. The oral contrast has passed through to the distal ileum with no evidence of bowel obstruction.  There is no abdominal adenopathy. There is postoperative stranding in the pelvis, particularly  in the presacral region.  There is no significant abnormality in the lower chest.  IMPRESSION: *Inflammatory stranding and soft tissue gas around the left lower quadrant ostomy, particularly at its lateral aspect. No drainable  collection. This likely represents cellulitis. *In the upper abdomen, there is soft tissue gas at the superficial aspect of the abdominal wall musculature without significant inflammatory change or fluid. This might relate to the recent surgery but cannot be characterized. *Small volume peritoneal fluid in the right pericolic gutter. The peritoneal JP drain does not pass through this portion of the gutter. No contained collection or abscess is evident. *Mild bilateral hydronephrosis. Satisfactorily positioned ureteral stents bilaterally.   Electronically Signed   By: Andreas Newport M.D.   On: 12/03/2014 01:38    Medications / Allergies: per chart  Antibiotics: Anti-infectives    Start     Dose/Rate Route Frequency Ordered Stop   12/03/14 0600  vancomycin (VANCOCIN) IVPB 1000 mg/200 mL premix     1,000 mg 200 mL/hr over 60 Minutes Intravenous Every 12 hours 12/02/14 1817     12/02/14 1930  clindamycin (CLEOCIN) IVPB 600 mg     600 mg 100 mL/hr over 30 Minutes Intravenous 4 times per day 12/02/14 1847     12/02/14 1830  metroNIDAZOLE (FLAGYL) IVPB 500 mg  Status:  Discontinued     500 mg 100 mL/hr over 60 Minutes Intravenous Every 8 hours 12/02/14 1816 12/02/14 1847   12/02/14 1815  vancomycin (VANCOCIN) 2,000 mg in sodium chloride 0.9 % 500 mL IVPB     2,000 mg 250 mL/hr over 120 Minutes Intravenous  Once 12/02/14 1814 12/02/14 2249   12/02/14 1815  ciprofloxacin (CIPRO) IVPB 400 mg  Status:  Discontinued     400 mg 200 mL/hr over 60 Minutes Intravenous Every 12 hours 12/02/14 1816 12/02/14 1847   12/02/14 1800  metroNIDAZOLE (FLAGYL) IVPB 500 mg  Status:  Discontinued     500 mg 100 mL/hr over 60 Minutes Intravenous Every 8 hours 12/02/14 1649 12/02/14 1733   12/02/14 1700  ciprofloxacin (CIPRO) IVPB 200 mg  Status:  Discontinued     200 mg 100 mL/hr over 60 Minutes Intravenous Every 12 hours 12/02/14 1646 12/02/14 1733       Note: Portions of this report may have been  transcribed using voice recognition software. Every effort was made to ensure accuracy; however, inadvertent computerized transcription errors may be present.   Any transcriptional errors that result from this process are unintentional.     [name], M.D., F.A.C.S. Gastrointestinal and Minimally Invasive Surgery [facility] [address], [place], [place] [address] [phone] Main / Paging   12/03/2014

## 2014-12-03 NOTE — Care Management Note (Signed)
Case Management Note  Patient Details  Name: Klair Ursery MRN: [id number] Date of Birth: 1956-05-06  Subjective/Objective:      59 yo admitted with Cellulitis of Left abd wall              Action/Plan:  From home with [facility] RN  Expected Discharge Date:   (unknown)               Expected Discharge Plan:  [place]  In-House Referral:     Discharge planning Services  CM Consult  Post Acute Care Choice:    Choice offered to:     DME Arranged:    DME Agency:     HH Arranged:  RN [place] Agency:  [facility]  Status of Service:  In process, will continue to follow  Medicare Important Message Given:    Date Medicare IM Given:    Medicare IM give by:    Date Additional Medicare IM Given:    Additional Medicare Important Message give by:     If discussed at [place] of Stay Meetings, dates discussed:    Additional Comments:  Pt is active with [facility] for [facility].  Will need MD order to resume services. CM will continue to follow.  Lynnell Catalan, RN 12/03/2014, 3:06 PM

## 2014-12-03 NOTE — Progress Notes (Signed)
Patient's colostomy was changed by night nurse.Earlier patient reports of a leak to to the side of the colostomy bag,offered my help to changed it,I got all the supplies in the room,patient refused bag to be changed, she said she will wait for the wound nurse,I paged the wound nurse 2x,no response at this time.

## 2014-12-03 NOTE — Progress Notes (Signed)
Endorsed to [place] to collect creatine body fluids.

## 2014-12-03 NOTE — Progress Notes (Signed)
[facility]  Patient Status: Active (receiving services up to time of hospitalization)  [facility] is providing the following services: RN  If patient discharges after hours, please call [phone].   [name] 12/03/2014, 11:27 AM

## 2014-12-04 ENCOUNTER — Other Ambulatory Visit: Payer: Self-pay

## 2014-12-04 ENCOUNTER — Encounter (HOSPITAL_COMMUNITY)
Admission: AD | Disposition: A | Discharge status: Home Health | Payer: Self-pay | Source: Ambulatory Visit | Attending: Gynecologic Oncology

## 2014-12-04 ENCOUNTER — Inpatient Hospital Stay (HOSPITAL_COMMUNITY): Payer: BLUE CROSS/BLUE SHIELD | Admitting: Anesthesiology

## 2014-12-04 HISTORY — PX: COLOSTOMY TAKEDOWN: SHX5258

## 2014-12-04 LAB — CBC WITH DIFFERENTIAL/PLATELET
Basophils Absolute: 0 10*3/uL (ref 0.0–0.1)
Basophils Relative: 1 % (ref 0–1)
EOS PCT: 6 % — AB (ref 0–5)
Eosinophils Absolute: 0.3 10*3/uL (ref 0.0–0.7)
HEMATOCRIT: 21.8 % — AB (ref 36.0–46.0)
Hemoglobin: 6.5 g/dL — CL (ref 12.0–15.0)
LYMPHS PCT: 36 % (ref 12–46)
Lymphs Abs: 1.6 10*3/uL (ref 0.7–4.0)
MCH: 26 pg (ref 26.0–34.0)
MCHC: 29.8 g/dL — ABNORMAL LOW (ref 30.0–36.0)
MCV: 87.2 fL (ref 78.0–100.0)
Monocytes Absolute: 1 10*3/uL (ref 0.1–1.0)
Monocytes Relative: 22 % — ABNORMAL HIGH (ref 3–12)
NEUTROS PCT: 35 % — AB (ref 43–77)
NRBC: 3 /100{WBCs} — AB
Neutro Abs: 1.6 10*3/uL — ABNORMAL LOW (ref 1.7–7.7)
Platelets: 523 10*3/uL — ABNORMAL HIGH (ref 150–400)
RBC: 2.5 MIL/uL — AB (ref 3.87–5.11)
RDW: 17.6 % — ABNORMAL HIGH (ref 11.5–15.5)
WBC: 4.5 10*3/uL (ref 4.0–10.5)

## 2014-12-04 LAB — POCT I-STAT 4, (NA,K, GLUC, HGB,HCT)
Glucose, Bld: 102 mg/dL — ABNORMAL HIGH (ref 70–99)
Glucose, Bld: 138 mg/dL — ABNORMAL HIGH (ref 70–99)
HCT: 26 % — ABNORMAL LOW (ref 36.0–46.0)
HEMATOCRIT: 28 % — AB (ref 36.0–46.0)
HEMOGLOBIN: 9.5 g/dL — AB (ref 12.0–15.0)
Hemoglobin: 8.8 g/dL — ABNORMAL LOW (ref 12.0–15.0)
Potassium: 4.5 mmol/L (ref 3.5–5.1)
Potassium: 4.4 mmol/L (ref 3.5–5.1)
Sodium: 138 mmol/L (ref 135–145)
Sodium: 136 mmol/L (ref 135–145)

## 2014-12-04 LAB — CREATININE, FLUID (PLEURAL, PERITONEAL, JP DRAINAGE): Creat, Fluid: 0.8 mg/dL

## 2014-12-04 LAB — BASIC METABOLIC PANEL
Anion gap: 6 (ref 5–15)
BUN: 11 mg/dL (ref 6–20)
CALCIUM: 8.4 mg/dL — AB (ref 8.9–10.3)
CO2: 26 mmol/L (ref 22–32)
CREATININE: 0.72 mg/dL (ref 0.44–1.00)
Chloride: 103 mmol/L (ref 101–111)
GFR calc Af Amer: >60 mL/min (ref >60)
GFR calc non Af Amer: >60 mL/min (ref >60)
Glucose, Bld: 145 mg/dL — ABNORMAL HIGH (ref 70–99)
Potassium: 4 mmol/L (ref 3.5–5.1)
Sodium: 135 mmol/L (ref 135–145)

## 2014-12-04 LAB — GLUCOSE, CAPILLARY
Glucose-Capillary: 123 mg/dL — ABNORMAL HIGH (ref 70–99)
Glucose-Capillary: 106 mg/dL — ABNORMAL HIGH (ref 70–99)
Glucose-Capillary: 144 mg/dL — ABNORMAL HIGH (ref 70–99)
Glucose-Capillary: 106 mg/dL — ABNORMAL HIGH (ref 70–99)

## 2014-12-04 LAB — SURGICAL PCR SCREEN
MRSA, PCR: NEGATIVE
Staphylococcus aureus: NEGATIVE

## 2014-12-04 SURGERY — CLOSURE, COLOSTOMY, LAPAROSCOPIC
Anesthesia: General

## 2014-12-04 MED ORDER — DEXTROSE-NACL 5-0.45 % IV SOLN
INTRAVENOUS | Status: DC
  Administered 2014-12-04: 20:00:00 via INTRAVENOUS
  Administered 2014-12-06: 1000 mL via INTRAVENOUS
  Administered 2014-12-08 – 2014-12-09 (×2): via INTRAVENOUS

## 2014-12-04 MED ORDER — LIDOCAINE HCL (CARDIAC) 20 MG/ML IV SOLN
INTRAVENOUS | Status: DC | PRN
  Administered 2014-12-04: 100 mg via INTRAVENOUS

## 2014-12-04 MED ORDER — EPHEDRINE SULFATE 50 MG/ML IJ SOLN
INTRAMUSCULAR | Status: DC | PRN
  Administered 2014-12-04: 5 mg via INTRAVENOUS

## 2014-12-04 MED ORDER — GENTAMICIN IN SALINE 1-0.9 MG/ML-% IV SOLN: 100.0000 mg | INTRAVENOUS | Status: DC

## 2014-12-04 MED ORDER — NEOSTIGMINE METHYLSULFATE 10 MG/10ML IV SOLN
INTRAVENOUS | Status: DC | PRN
  Administered 2014-12-04: 4 mg via INTRAVENOUS

## 2014-12-04 MED ORDER — KETAMINE HCL 10 MG/ML IJ SOLN
INTRAMUSCULAR | Status: DC | PRN
  Administered 2014-12-04 (×5): 10 mg via INTRAVENOUS

## 2014-12-04 MED ORDER — MIDAZOLAM HCL 2 MG/2ML IJ SOLN
1.0000 mg | Freq: Once | INTRAMUSCULAR | Status: AC
  Administered 2014-12-04: 1 mg via INTRAVENOUS

## 2014-12-04 MED ORDER — DEXTROSE-NACL 5-0.45 % IV SOLN
INTRAVENOUS | Status: DC
  Administered 2014-12-04: 1000 mL via INTRAVENOUS

## 2014-12-04 MED ORDER — PROMETHAZINE HCL 25 MG/ML IJ SOLN: 6.2500 mg | INTRAMUSCULAR | Status: DC | PRN

## 2014-12-04 MED ORDER — FENTANYL CITRATE (PF) 250 MCG/5ML IJ SOLN
INTRAMUSCULAR | Status: AC
  Filled 2014-12-04: qty 5

## 2014-12-04 MED ORDER — 0.9 % SODIUM CHLORIDE (POUR BTL) OPTIME
TOPICAL | Status: DC | PRN
  Administered 2014-12-04 (×2): 1000 mL

## 2014-12-04 MED ORDER — HYDROMORPHONE HCL 1 MG/ML IJ SOLN
0.2500 mg | INTRAMUSCULAR | Status: DC | PRN
  Administered 2014-12-04 (×6): 0.5 mg via INTRAVENOUS

## 2014-12-04 MED ORDER — SODIUM CHLORIDE 0.9 % IV SOLN
INTRAVENOUS | Status: DC | PRN
  Administered 2014-12-04: 14:00:00 via INTRAVENOUS

## 2014-12-04 MED ORDER — ROCURONIUM BROMIDE 100 MG/10ML IV SOLN
INTRAVENOUS | Status: AC
  Filled 2014-12-04: qty 1

## 2014-12-04 MED ORDER — PROPOFOL 10 MG/ML IV BOLUS
INTRAVENOUS | Status: DC | PRN
Start: 2014-12-04 — End: 2014-12-04
  Administered 2014-12-04: 170 mg via INTRAVENOUS

## 2014-12-04 MED ORDER — SUCCINYLCHOLINE CHLORIDE 20 MG/ML IJ SOLN
INTRAMUSCULAR | Status: DC | PRN
  Administered 2014-12-04: 140 mg via INTRAVENOUS

## 2014-12-04 MED ORDER — HYDROMORPHONE HCL 1 MG/ML IJ SOLN
0.5000 mg | INTRAMUSCULAR | Status: DC | PRN
  Administered 2014-12-04 – 2014-12-05 (×7): 2 mg via INTRAVENOUS
  Administered 2014-12-10: 1 mg via INTRAVENOUS
  Filled 2014-12-04 (×5): qty 2
  Filled 2014-12-04: qty 1
  Filled 2014-12-04: qty 2
  Filled 2014-12-04: qty 1

## 2014-12-04 MED ORDER — ESMOLOL HCL 10 MG/ML IV SOLN
INTRAVENOUS | Status: DC | PRN
  Administered 2014-12-04: 30 mg via INTRAVENOUS

## 2014-12-04 MED ORDER — CHLORHEXIDINE GLUCONATE 4 % EX LIQD
1.0000 "application " | Freq: Once | CUTANEOUS | Status: AC
  Administered 2014-12-04: 1 via TOPICAL
  Filled 2014-12-04 (×3): qty 15

## 2014-12-04 MED ORDER — CLINDAMYCIN PHOSPHATE 900 MG/50ML IV SOLN
INTRAVENOUS | Status: AC
  Filled 2014-12-04: qty 50

## 2014-12-04 MED ORDER — BUPIVACAINE-EPINEPHRINE 0.25% -1:200000 IJ SOLN
INTRAMUSCULAR | Status: AC
  Filled 2014-12-04: qty 2

## 2014-12-04 MED ORDER — PROPOFOL 10 MG/ML IV BOLUS
INTRAVENOUS | Status: AC
  Filled 2014-12-04: qty 20

## 2014-12-04 MED ORDER — ACETAMINOPHEN 10 MG/ML IV SOLN
1000.0000 mg | Freq: Once | INTRAVENOUS | Status: AC
  Administered 2014-12-04: 1000 mg via INTRAVENOUS
  Filled 2014-12-04: qty 100

## 2014-12-04 MED ORDER — MIDAZOLAM HCL 2 MG/2ML IJ SOLN
INTRAMUSCULAR | Status: AC
  Filled 2014-12-04: qty 2

## 2014-12-04 MED ORDER — KETOROLAC TROMETHAMINE 30 MG/ML IJ SOLN
INTRAMUSCULAR | Status: AC
  Filled 2014-12-04: qty 1

## 2014-12-04 MED ORDER — BUPIVACAINE-EPINEPHRINE 0.25% -1:200000 IJ SOLN
INTRAMUSCULAR | Status: DC | PRN
  Administered 2014-12-04: 50 mL
  Administered 2014-12-04: 20 mL
  Administered 2014-12-04: 50 mL

## 2014-12-04 MED ORDER — HYDROMORPHONE HCL 1 MG/ML IJ SOLN
INTRAMUSCULAR | Status: AC
  Filled 2014-12-04: qty 1

## 2014-12-04 MED ORDER — MIDAZOLAM HCL 5 MG/5ML IJ SOLN
INTRAMUSCULAR | Status: DC | PRN
  Administered 2014-12-04: 2 mg via INTRAVENOUS

## 2014-12-04 MED ORDER — ONDANSETRON HCL 4 MG/2ML IJ SOLN
INTRAMUSCULAR | Status: DC | PRN
  Administered 2014-12-04: 4 mg via INTRAVENOUS

## 2014-12-04 MED ORDER — GLYCOPYRROLATE 0.2 MG/ML IJ SOLN
INTRAMUSCULAR | Status: DC | PRN
  Administered 2014-12-04: 0.6 mg via INTRAVENOUS

## 2014-12-04 MED ORDER — ONDANSETRON HCL 4 MG/2ML IJ SOLN
INTRAMUSCULAR | Status: AC
  Filled 2014-12-04: qty 2

## 2014-12-04 MED ORDER — LACTATED RINGERS IV SOLN
INTRAVENOUS | Status: DC | PRN
  Administered 2014-12-04: 13:00:00 via INTRAVENOUS

## 2014-12-04 MED ORDER — KETAMINE HCL 10 MG/ML IJ SOLN
INTRAMUSCULAR | Status: AC
  Filled 2014-12-04: qty 1

## 2014-12-04 MED ORDER — INSULIN ASPART 100 UNIT/ML ~~LOC~~ SOLN
0.0000 [IU] | SUBCUTANEOUS | Status: DC
  Administered 2014-12-04 – 2014-12-06 (×6): 3 [IU] via SUBCUTANEOUS

## 2014-12-04 MED ORDER — FENTANYL CITRATE (PF) 100 MCG/2ML IJ SOLN
INTRAMUSCULAR | Status: DC | PRN
  Administered 2014-12-04: 200 ug via INTRAVENOUS
  Administered 2014-12-04 (×3): 50 ug via INTRAVENOUS

## 2014-12-04 MED ORDER — DEXMEDETOMIDINE BOLUS VIA INFUSION
INTRAVENOUS | Status: DC | PRN
  Administered 2014-12-04 (×4): 10 ug via INTRAVENOUS

## 2014-12-04 MED ORDER — LACTATED RINGERS IV SOLN
INTRAVENOUS | Status: DC | PRN
  Administered 2014-12-04 (×3): via INTRAVENOUS

## 2014-12-04 MED ORDER — SODIUM CHLORIDE 0.9 % IV SOLN
Freq: Once | INTRAVENOUS | Status: AC
  Administered 2014-12-04: 08:00:00 via INTRAVENOUS

## 2014-12-04 MED ORDER — ROCURONIUM BROMIDE 100 MG/10ML IV SOLN
INTRAVENOUS | Status: DC | PRN
  Administered 2014-12-04 (×2): 10 mg via INTRAVENOUS
  Administered 2014-12-04: 5 mg via INTRAVENOUS
  Administered 2014-12-04: 35 mg via INTRAVENOUS
  Administered 2014-12-04 (×3): 10 mg via INTRAVENOUS

## 2014-12-04 MED ORDER — FENTANYL CITRATE (PF) 100 MCG/2ML IJ SOLN
INTRAMUSCULAR | Status: AC
  Filled 2014-12-04: qty 2

## 2014-12-04 MED ORDER — GLYCOPYRROLATE 0.2 MG/ML IJ SOLN
INTRAMUSCULAR | Status: AC
  Filled 2014-12-04: qty 2

## 2014-12-04 MED ORDER — DEXTROSE 5 % IV SOLN
10.0000 mg | INTRAVENOUS | Status: DC | PRN
  Administered 2014-12-04: 20 ug/min via INTRAVENOUS

## 2014-12-04 MED ORDER — NEOSTIGMINE METHYLSULFATE 10 MG/10ML IV SOLN
INTRAVENOUS | Status: AC
  Filled 2014-12-04: qty 1

## 2014-12-04 MED ORDER — PHENYLEPHRINE HCL 10 MG/ML IJ SOLN
INTRAMUSCULAR | Status: AC
  Filled 2014-12-04: qty 1

## 2014-12-04 MED ORDER — DEXMEDETOMIDINE HCL IN NACL 200 MCG/50ML IV SOLN
0.4000 ug/kg/h | INTRAVENOUS | Status: DC
  Filled 2014-12-04: qty 50

## 2014-12-04 MED ORDER — DEXTROSE 5 % IV SOLN
5.0000 mg/kg | Freq: Once | INTRAVENOUS | Status: AC
  Administered 2014-12-04: 380 mg via INTRAVENOUS
  Filled 2014-12-04: qty 9.5

## 2014-12-04 MED ORDER — CHLORHEXIDINE GLUCONATE 4 % EX LIQD
1.0000 "application " | Freq: Once | CUTANEOUS | Status: DC
  Filled 2014-12-04: qty 15

## 2014-12-04 SURGICAL SUPPLY — 81 items
APPLIER CLIP 5 13 M/L LIGAMAX5 (MISCELLANEOUS)
APPLIER CLIP ROT 10 11.4 M/L (STAPLE)
BLADE EXTENDED COATED 6.5IN (ELECTRODE) IMPLANT
BLADE HEX COATED 2.75 (ELECTRODE) ×4 IMPLANT
BLADE SURG SZ10 CARB STEEL (BLADE) ×2 IMPLANT
CABLE HIGH FREQUENCY MONO STRZ (ELECTRODE) ×2 IMPLANT
CATH KIT ON-Q SILVERSOAK 7.5IN (CATHETERS) IMPLANT
CELLS DAT CNTRL 66122 CELL SVR (MISCELLANEOUS) IMPLANT
CLIP APPLIE 5 13 M/L LIGAMAX5 (MISCELLANEOUS) IMPLANT
CLIP APPLIE ROT 10 11.4 M/L (STAPLE) IMPLANT
COUNTER NEEDLE 20 DBL MAG RED (NEEDLE) ×2 IMPLANT
COVER MAYO STAND STRL (DRAPES) ×4 IMPLANT
DECANTER SPIKE VIAL GLASS SM (MISCELLANEOUS) ×2 IMPLANT
DEVICE TROCAR PUNCTURE CLOSURE (ENDOMECHANICALS) ×2 IMPLANT
DRAIN CHANNEL 19F RND (DRAIN) IMPLANT
DRAPE LAPAROSCOPIC ABDOMINAL (DRAPES) ×2 IMPLANT
DRAPE SHEET LG 3/4 BI-LAMINATE (DRAPES) ×2 IMPLANT
DRAPE UTILITY XL STRL (DRAPES) ×4 IMPLANT
DRAPE WARM FLUID 44X44 (DRAPE) ×2 IMPLANT
DRSG KUZMA FLUFF (GAUZE/BANDAGES/DRESSINGS) ×2 IMPLANT
DRSG OPSITE POSTOP 4X10 (GAUZE/BANDAGES/DRESSINGS) IMPLANT
DRSG OPSITE POSTOP 4X6 (GAUZE/BANDAGES/DRESSINGS) IMPLANT
DRSG OPSITE POSTOP 4X8 (GAUZE/BANDAGES/DRESSINGS) IMPLANT
DRSG TEGADERM 2-3/8X2-3/4 SM (GAUZE/BANDAGES/DRESSINGS) ×4 IMPLANT
DRSG TEGADERM 4X4.75 (GAUZE/BANDAGES/DRESSINGS) IMPLANT
ELECT REM PT RETURN 9FT ADLT (ELECTROSURGICAL) ×2
ELECTRODE REM PT RTRN 9FT ADLT (ELECTROSURGICAL) ×1 IMPLANT
EVACUATOR SILICONE 100CC (DRAIN) IMPLANT
GAUZE SPONGE 2X2 8PLY STRL LF (GAUZE/BANDAGES/DRESSINGS) IMPLANT
GAUZE SPONGE 4X4 12PLY STRL (GAUZE/BANDAGES/DRESSINGS) ×2 IMPLANT
GLOVE ECLIPSE 8.0 STRL XLNG CF (GLOVE) ×4 IMPLANT
GLOVE INDICATOR 8.0 STRL GRN (GLOVE) ×4 IMPLANT
GOWN STRL REUS W/TWL XL LVL3 (GOWN DISPOSABLE) ×8 IMPLANT
KIT BASIN OR (CUSTOM PROCEDURE TRAY) ×2 IMPLANT
LEGGING LITHOTOMY PAIR STRL (DRAPES) ×2 IMPLANT
LIGASURE IMPACT 36 18CM CVD LR (INSTRUMENTS) IMPLANT
LUBRICANT JELLY K Y 4OZ (MISCELLANEOUS) IMPLANT
PENCIL BUTTON HOLSTER BLD 10FT (ELECTRODE) ×4 IMPLANT
POUCH OSTO 2 PC DRNBL 2.75 (WOUND CARE) ×1 IMPLANT
POUCH OSTOMY DRAINABLE 2 3/4 (WOUND CARE) ×1
RELOAD STAPLER BLUE 60MM (STAPLE) ×2 IMPLANT
RTRCTR WOUND ALEXIS 18CM MED (MISCELLANEOUS)
SCISSORS LAP 5X35 DISP (ENDOMECHANICALS) ×2 IMPLANT
SEALER TISSUE G2 STRG ARTC 35C (ENDOMECHANICALS) ×2 IMPLANT
SET IRRIG TUBING LAPAROSCOPIC (IRRIGATION / IRRIGATOR) IMPLANT
SLEEVE XCEL OPT CAN 5 100 (ENDOMECHANICALS) ×4 IMPLANT
SPONGE GAUZE 2X2 STER 10/PKG (GAUZE/BANDAGES/DRESSINGS)
SPONGE LAP 18X18 X RAY DECT (DISPOSABLE) ×6 IMPLANT
STAPLE ECHEON FLEX 60 POW ENDO (STAPLE) ×2 IMPLANT
STAPLER RELOAD BLUE 60MM (STAPLE) ×4
STAPLER VISISTAT 35W (STAPLE) IMPLANT
SUCTION POOLE TIP (SUCTIONS) ×2 IMPLANT
SUT MNCRL AB 4-0 PS2 18 (SUTURE) ×4 IMPLANT
SUT PDS AB 1 CTX 36 (SUTURE) ×4 IMPLANT
SUT PDS AB 1 TP1 96 (SUTURE) IMPLANT
SUT PROLENE 0 CT 2 (SUTURE) IMPLANT
SUT SILK 2 0 (SUTURE) ×1
SUT SILK 2 0 SH CR/8 (SUTURE) ×2 IMPLANT
SUT SILK 2-0 18XBRD TIE 12 (SUTURE) ×1 IMPLANT
SUT SILK 3 0 (SUTURE) ×1
SUT SILK 3 0 SH CR/8 (SUTURE) ×2 IMPLANT
SUT SILK 3-0 18XBRD TIE 12 (SUTURE) ×1 IMPLANT
SUT VIC AB 3-0 SH 18 (SUTURE) ×6 IMPLANT
SUT VICRYL 0 UR6 27IN ABS (SUTURE) ×4 IMPLANT
SUT VICRYL 2 0 18  UND BR (SUTURE)
SUT VICRYL 2 0 18 UND BR (SUTURE) IMPLANT
SYR BULB IRRIGATION 50ML (SYRINGE) ×2 IMPLANT
SYS LAPSCP GELPORT 120MM (MISCELLANEOUS)
SYSTEM LAPSCP GELPORT 120MM (MISCELLANEOUS) IMPLANT
TAPE UMBILICAL COTTON 1/8X30 (MISCELLANEOUS) ×2 IMPLANT
TOWEL OR 17X26 10 PK STRL BLUE (TOWEL DISPOSABLE) ×4 IMPLANT
TOWEL OR NON WOVEN STRL DISP B (DISPOSABLE) ×4 IMPLANT
TRAY FOLEY W/METER SILVER 14FR (SET/KITS/TRAYS/PACK) IMPLANT
TRAY LAPAROSCOPIC (CUSTOM PROCEDURE TRAY) ×2 IMPLANT
TROCAR BLADELESS OPT 12M 100M (ENDOMECHANICALS) ×2 IMPLANT
TROCAR BLADELESS OPT 5 100 (ENDOMECHANICALS) ×2 IMPLANT
TROCAR XCEL NON-BLD 11X100MML (ENDOMECHANICALS) IMPLANT
TUBING CONNECTING 10 (TUBING) IMPLANT
TUBING INSUFFLATION 10FT LAP (TUBING) ×2 IMPLANT
TUNNELER SHEATH ON-Q 16GX12 DP (PAIN MANAGEMENT) IMPLANT
YANKAUER SUCT BULB TIP 10FT TU (MISCELLANEOUS) ×4 IMPLANT

## 2014-12-04 NOTE — Progress Notes (Signed)
Secretary called blood bank,blood not ready at this time.

## 2014-12-04 NOTE — Progress Notes (Signed)
Dr. Everitt Amber called and said to hold lovenox,this med is scheduled at bedtime and Dr. Denman [name] said she can have this after surgery,patient's surgery at 1330 pm.

## 2014-12-04 NOTE — Progress Notes (Signed)
[address] [place], [place] [address] Phone: [phone] FAX: [phone]    [name] [id number] [birth year]  CARE TEAM:  PCP: Leana Gamer., MD  Outpatient Care Team: Patient Care Team: Leana Gamer, MD as PCP - General (Internal Medicine) [name], MD as Referring Physician (Obstetrics and Gynecology) Heath Lark, MD as Consulting Physician (Hematology and Oncology) Everitt Amber, MD as Consulting Physician (Obstetrics and Gynecology) Michael [name], MD as Consulting Physician (General Surgery)  Inpatient Treatment Team: Treatment Team: Attending Provider: Everitt Amber, MD; Consulting Physician: Michael [name], MD; Technician: Suzanna Obey, NT; Respiratory Therapist: Lita Mains, RRT; Physician Assistant: Rondel Jumbo, PA-C; Consulting Physician: Heath Lark, MD; Registered Nurse: Nadara Mode, RN; Technician: Lucas Mallow, NT; Technician: Tennis Ship, NT; Registered Nurse: Ludwig Lean, RN; Technician: Lennie Odor, NT  Problem List:   Principal Problem:   Cellulitis of left abdominal wall near colostomy Active Problems:   Chronic neutropenia      Procedure(s): POST-OPERATIVE DIAGNOSIS: 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 TWO LYMPH NODES, NEGATIVE FOR ATYPIA OR MALIGNANCY (0/2). RESECTION MARGINS, NEGATIVE FOR ATYPIA OR  MALIGNANCY.  Microscopic Comment Please see Wylodean Shimmel description for details. [name] Pathologist, Electronic Signature (Case signed 11/21/2014) Specimen Jacinto Keil and Clinical Information Specimen(s) Obtained: Colon, segmental resection for tumor, rectosigmoid colon with upper vagina Specimen Clinical Information endometrial cancer (kp) Zamyiah Tino The specimen is received in formalin labeled rectosigmoid colon and upper vagina, and consists of an 18.1 cm in length portion of colon, with the proximal margin stapled and the distal margin open. On the anterior aspect of the colon, there is a 2.7 x 2.6 cm slightly disrupted circular portion of gray-brown mucosa designated by a stitch, which is clinically stated as posterior vaginal wall. The resection margin surrounding the vaginal tissue is inked black, and the distal resection margin is inked black for orientation. The soft tissue surrounding the vaginal tissue along the right side is slightly firm. The lumen of the colon contains a small amount of green-brown fecal material, and the mucosa is tan-pink with normal folding. No mucosal lesions are grossly identified, however, palpating the colon, which overlies the indurated adipose tissue reveals a firm nodule within the possible submucosa and muscularis. The nodule measures approximately 0.6 cm, and is 2.9 cm from the distal resection margin. Sectioning through the distal aspect of the colon and underlying soft tissue reveals a 4.6 x 4.5 x 4.0 cm tan-red, hemorrhagic, softened lesion within the 1 of 2 FINAL for [name], [name] ([id number]) Donica Derouin(continued) underlying adipose tissue. The lesion invades into the overlying muscularis propria at the area previously noted. The colon wall measures 0.5 cm in thickness. Three tan-pink possible lymph nodes are identified, ranging from 0.2 cm to 0.5 cm in greatest dimension. Please note the patient is status post chemotherapy. Representative sections  are submitted in twelve cassettes. A = proximal resection margin. B, C = distal resection margin. D - F = lesion to overlying colon. G, H = lesion. I, J = vaginal mucosa. K = uninvolved  mucosa. L = three possible lymph nodes. (KL:ecj 11/20/2014) Report signed out from the following location(s) Technical component and interpretation was performed at [place]  Cellulitis with abscess lateral to colostomy with stool, possible deep fistula  Plan:  IV ABx.  Vanco/Clindamycin with ### ABx allergies.  No major improvement in last 2 days.    Because the induration has returned and the erythema has not decreased markedly, I am concerned that nonoperative management has failed.  At the very least, the abdominal wall near the colostomy needs to be explored.  If there is evidence of significant fecal contamination, would anticipate revision of the colostomy.  Most likely relocate.  Given her obesity morbid obesity & panniculus, I think this needs to be supraumbilical to avoid less tension and pull on the colostomy & less SQ thickness which most likely was a factor in this situation.  Hopefully relocation if needed can be done laparoscopically assisted and avoid a repeat laparotomy.  The anatomy & physiology of the digestive tract was discussed.  The pathophysiology of the colon was discussed.  Natural history risks without surgery was discussed.   I feel the risks of no intervention will lead to serious problems that outweigh the operative risks; therefore, I recommended abd exploration with possible colostomy revision/relocation.  Minimally invasive (Robotic/Laparoscopic) & open techniques were discussed.   Risks such as bleeding, infection, abscess, leak, reoperation, possible ostomy, hernia, heart attack, stroke, death, and other risks were discussed.  I noted a good likelihood this will help address the problem.   Goals of post-operative recovery were discussed as well.   Need for adequate  nutrition, daily bowel regimen and healthy physical activity, to optimize recovery was noted as well. We will work to minimize complications.  Educational materials were available as well.  Questions were answered.  The patient expresses understanding & wishes to proceed with surgery.  Anemia.  Low Hgb - follow - transfuse if Hgb <7 and symptomatic if GynOnc Dr Rossi/LJM agree.  ?Reinvolve Hematology/oncology - CSF to help stim BMarrow?  Pt asymptomatic.  Dr Denman [name] offered transfusion.  Pt wishes to wait.  Most likely would benefit from blood transfusion w reoperation - see if pt will relent  Low K & Mg - replacing  Pain control -  Continue tylenol RTC.  PO oxycodone w IV Dilaudid backup.  Hold NSAIDs with falling Hgb  Foley - DO NOT REMOVE due to bladder repair & ureteral stenting.  She had complex cystotomy repair (trigone and base) and ureteral stenting. DO NOT REMOVE FOLEY CATHETER - will perform retrograde cystogram in 12May will d.c foley at that time IF no leak noted. Remove ureteral stents at 6 weeks if no stricture/leak on CT urogram.  DRAIN:  Per Dr Denman [name] / Gyn Onc, check JP creatinine prior to removing drain (if elevated above serum levels would keep JP in until foley comes out/bladder integrity confirmed on imaging). Floor sent some off this morning.    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 TWO LYMPH NODES, NEGATIVE FOR ATYPIA OR MALIGNANCY (0/2). I am skeptical that only 2 LN were found in 18cm of intact mesorectum & mesocolon although not a colon adenoCA = may not be usually drainage point for the recurrent endometriod CA  RESECTION MARGINS, NEGATIVE FOR ATYPIA OR MALIGNANCY.  Glc control - ? SSI.  Restarting PO hypoglycemics  HTN - OK off meds - restart gradually.  GERD - protonix  daily  -VTE prophylaxis- SCDs, etc  -mobilize as tolerated to help recovery    I updated the  patient's status to the patient & family.  D/we Dr Denman [name].  Recommendations were made.  Questions were answered.  The patient expressed understanding & appreciation.   [name], M.D., F.A.C.S. Gastrointestinal and Minimally Invasive Surgery [facility] [address] [place], [place] [address] [phone] Main / Paging   12/04/2014  Subjective:  Events noted with stool expressed from SQ at 9 o'clock border of colostomy. Pain and swelling returned 3W onc floor Pain controlled No n/v.  Tol solids Mild abdominal cramping  Objective:  Vital signs:  Filed Vitals:   12/03/14 0613 12/03/14 1310 12/03/14 2041 12/04/14 0515  BP: 103/46 110/61 104/47 113/46  Pulse:  71 70 69  Temp:  97.5 F (36.4 C) 97.9 F (36.6 C) 98.2 F (36.8 C)  TempSrc:  Oral Oral Oral  Resp:  18 16 16   Height:      Weight:      SpO2:  100% 100% 98%    Last BM Date: 12/03/14  Intake/Output   Yesterday:  05/03 0701 - 05/04 0700 In: 1370 [P.O.:1300; IV Piggyback:50] Out: 2000 [Urine:1750; Drains:50; Stool:200] This shift:  Total I/O In: 700 [P.O.:700] Out: 1250 [Urine:1000; Drains:50; Stool:200]  Bowel function:  Flatus: yes  BM: yes  Drain: thinly serosanguinous  Physical Exam:  General: Pt awake/alert/oriented in no acute distress.  Tired Eyes: PERRL, normal EOM.  Sclera clear.  No icterus Neuro: CN II-XII intact w/o focal sensory/motor deficits. Lymph: No head/neck/groin lymphadenopathy Psych:  No delerium/psychosis/paranoia HENT: Normocephalic, Mucus membranes moist.  No thrush.   Neck: Supple, No tracheal deviation Chest: No chest wall pain w good excursion CV:  Pulses intact.  Regular rhythm MS: Normal AROM mjr joints.  No obvious deformity Abdomen: Soft.  Nondistended.  Incision closed.   Tender at LUQ of colostomy w induration & erythema.  Stable from yesterday AM but worse from last night.  No evidence of peritonitis.  No incarcerated  hernias. Ext:  SCDs BLE.  No mjr edema.  No cyanosis Skin: No petechiae / purpura  Results:   Labs: Results for orders placed or performed during the hospital encounter of 12/02/14 (from the past 48 hour(s))  Basic metabolic panel     Status: Abnormal   Collection Time: 12/02/14  5:31 PM  Result Value Ref Range   Sodium 138 135 - 145 mmol/L   Potassium 2.8 (L) 3.5 - 5.1 mmol/L   Chloride 103 101 - 111 mmol/L   CO2 29 22 - 32 mmol/L   Glucose, Bld 130 (H) 70 - 99 mg/dL   BUN 11 6 - 20 mg/dL   Creatinine, Ser 0.67 0.44 - 1.00 mg/dL   Calcium 9.1 8.9 - 10.3 mg/dL   GFR calc non Af Amer >60 >60 mL/min   GFR calc Af Amer >60 >60 mL/min    Comment: (NOTE) The eGFR has been calculated using the CKD EPI equation. This calculation has not been validated in all clinical situations. eGFR's persistently <90 mL/min signify possible Chronic Kidney Disease.    Anion gap 6 5 - 15  CBC WITH DIFFERENTIAL     Status: Abnormal   Collection Time: 12/02/14  5:31 PM  Result Value Ref Range   WBC 4.9 4.0 - 10.5 K/uL   RBC 2.60 (L) 3.87 - 5.11 MIL/uL   Hemoglobin 6.6 (LL) 12.0 - 15.0 g/dL  Comment: REPEATED TO VERIFY CRITICAL RESULT CALLED TO, READ BACK BY AND VERIFIED WITH: F HERDON RN @1810  5.2.16 BY A MORRIS    HCT 22.7 (L) 36.0 - 46.0 %   MCV 87.3 78.0 - 100.0 fL   MCH 25.4 (L) 26.0 - 34.0 pg   MCHC 29.1 (L) 30.0 - 36.0 g/dL   RDW 17.4 (H) 11.5 - 15.5 %   Platelets 537 (H) 150 - 400 K/uL   Neutrophils Relative % 37 (L) 43 - 77 %   Lymphocytes Relative 31 12 - 46 %   Monocytes Relative 24 (H) 3 - 12 %   Eosinophils Relative 6 (H) 0 - 5 %   Basophils Relative 2 (H) 0 - 1 %   Neutro Abs 1.8 1.7 - 7.7 K/uL   Lymphs Abs 1.5 0.7 - 4.0 K/uL   Monocytes Absolute 1.2 (H) 0.1 - 1.0 K/uL   Eosinophils Absolute 0.3 0.0 - 0.7 K/uL   Basophils Absolute 0.1 0.0 - 0.1 K/uL   RBC Morphology POLYCHROMASIA PRESENT     Comment: RARE NRBCs   WBC Morphology MILD LEFT SHIFT (1-5% METAS, OCC MYELO, OCC  BANDS)     Comment: TOXIC GRANULATION  Magnesium     Status: Abnormal   Collection Time: 12/02/14  5:31 PM  Result Value Ref Range   Magnesium 1.3 (L) 1.7 - 2.4 mg/dL  Glucose, capillary     Status: Abnormal   Collection Time: 12/02/14  5:36 PM  Result Value Ref Range   Glucose-Capillary 106 (H) 70 - 99 mg/dL   Comment 1 Notify RN    Comment 2 Document in Chart   Basic metabolic panel     Status: Abnormal   Collection Time: 12/03/14  4:08 AM  Result Value Ref Range   Sodium 137 135 - 145 mmol/L   Potassium 3.2 (L) 3.5 - 5.1 mmol/L   Chloride 100 (L) 101 - 111 mmol/L   CO2 28 22 - 32 mmol/L   Glucose, Bld 124 (H) 70 - 99 mg/dL   BUN 11 6 - 20 mg/dL   Creatinine, Ser 0.80 0.44 - 1.00 mg/dL   Calcium 8.4 (L) 8.9 - 10.3 mg/dL   GFR calc non Af Amer >60 >60 mL/min   GFR calc Af Amer >60 >60 mL/min    Comment: (NOTE) The eGFR has been calculated using the CKD EPI equation. This calculation has not been validated in all clinical situations. eGFR's persistently <90 mL/min signify possible Chronic Kidney Disease.    Anion gap 9 5 - 15  CBC     Status: Abnormal   Collection Time: 12/03/14  4:08 AM  Result Value Ref Range   WBC 5.5 4.0 - 10.5 K/uL   RBC 2.34 (L) 3.87 - 5.11 MIL/uL   Hemoglobin 6.0 (LL) 12.0 - 15.0 g/dL    Comment: REPEATED TO VERIFY CRITICAL RESULT CALLED TO, READ BACK BY AND VERIFIED WITH: S PADDEN AT 0447 ON 05.03.16 BY G KONTOS    HCT 20.5 (L) 36.0 - 46.0 %   MCV 87.6 78.0 - 100.0 fL   MCH 25.6 (L) 26.0 - 34.0 pg   MCHC 29.3 (L) 30.0 - 36.0 g/dL   RDW 17.3 (H) 11.5 - 15.5 %   Platelets 442 (H) 150 - 400 K/uL  Magnesium     Status: Abnormal   Collection Time: 12/03/14  4:08 AM  Result Value Ref Range   Magnesium 1.3 (L) 1.7 - 2.4 mg/dL  CBC WITH DIFFERENTIAL  Status: Abnormal   Collection Time: 12/04/14  4:04 AM  Result Value Ref Range   WBC 4.5 4.0 - 10.5 K/uL   RBC 2.50 (L) 3.87 - 5.11 MIL/uL   Hemoglobin 6.5 (LL) 12.0 - 15.0 g/dL    Comment:  CRITICAL VALUE NOTED.  VALUE IS CONSISTENT WITH PREVIOUSLY REPORTED AND CALLED VALUE.   HCT 21.8 (L) 36.0 - 46.0 %   MCV 87.2 78.0 - 100.0 fL   MCH 26.0 26.0 - 34.0 pg   MCHC 29.8 (L) 30.0 - 36.0 g/dL   RDW 17.6 (H) 11.5 - 15.5 %   Platelets 523 (H) 150 - 400 K/uL   Neutrophils Relative % 35 (L) 43 - 77 %   Lymphocytes Relative 36 12 - 46 %   Monocytes Relative 22 (H) 3 - 12 %   Eosinophils Relative 6 (H) 0 - 5 %   Basophils Relative 1 0 - 1 %   nRBC 3 (H) 0 /100 WBC   Neutro Abs 1.6 (L) 1.7 - 7.7 K/uL   Lymphs Abs 1.6 0.7 - 4.0 K/uL   Monocytes Absolute 1.0 0.1 - 1.0 K/uL   Eosinophils Absolute 0.3 0.0 - 0.7 K/uL   Basophils Absolute 0.0 0.0 - 0.1 K/uL   RBC Morphology POLYCHROMASIA PRESENT     Comment: RARE NRBCs   WBC Morphology MILD LEFT SHIFT (1-5% METAS, OCC MYELO, OCC BANDS)     Imaging / Studies: Ct Abdomen Pelvis W Contrast  12/03/2014   CLINICAL DATA:  Recurrent endometrial carcinoma. Stomal infection in setting of chronic neutropenia.  EXAM: CT ABDOMEN AND PELVIS WITH CONTRAST  TECHNIQUE: Multidetector CT imaging of the abdomen and pelvis was performed using the standard protocol following bolus administration of intravenous contrast.  CONTRAST:  8m OMNIPAQUE IOHEXOL 300 MG/ML SOLN, 1047mOMNIPAQUE IOHEXOL 300 MG/ML SOLN  COMPARISON:  MRI 10/14/2014  FINDINGS: In the upper abdomen, there is soft tissue gas at the superficial aspect of the abdominal wall musculature in the right upper quadrant, left upper quadrant and right para midline epigastric region. No significant inflammatory changes or fluid are evident in these areas.  In the abdominal left lower quadrant there is a mid descending colostomy. There is inflammatory stranding in the subcutaneous soft tissues around the ostomy. There is soft tissue gas at the lateral aspect of the stoma, but no evidence of a drainable collection.  There is a small volume peritoneal fluid in the right pericolic gutter, but no evidence of a  contained fluid collection or abscess in the abdomen or pelvis. There is is a peritoneal drain which does not pass through this portion of the right pericolic gutter. There are bilateral ureteral stents. There is mild bilateral hydronephrosis.  There are normal appearances of the liver, with no evidence of hepatic metastasis. There are normal appearances of the spleen, pancreas, and adrenals. Stomach and small bowel appear unremarkable. The oral contrast has passed through to the distal ileum with no evidence of bowel obstruction.  There is no abdominal adenopathy. There is postoperative stranding in the pelvis, particularly in the presacral region.  There is no significant abnormality in the lower chest.  IMPRESSION: *Inflammatory stranding and soft tissue gas around the left lower quadrant ostomy, particularly at its lateral aspect. No drainable collection. This likely represents cellulitis. *In the upper abdomen, there is soft tissue gas at the superficial aspect of the abdominal wall musculature without significant inflammatory change or fluid. This might relate to the recent surgery but cannot be characterized. *  Small volume peritoneal fluid in the right pericolic gutter. The peritoneal JP drain does not pass through this portion of the gutter. No contained collection or abscess is evident. *Mild bilateral hydronephrosis. Satisfactorily positioned ureteral stents bilaterally.   Electronically Signed   By: Andreas Newport M.D.   On: 12/03/2014 01:38    Medications / Allergies: per chart  Antibiotics: Anti-infectives    Start     Dose/Rate Route Frequency Ordered Stop   12/03/14 0600  vancomycin (VANCOCIN) IVPB 1000 mg/200 mL premix     1,000 mg 200 mL/hr over 60 Minutes Intravenous Every 12 hours 12/02/14 1817     12/02/14 1930  clindamycin (CLEOCIN) IVPB 600 mg     600 mg 100 mL/hr over 30 Minutes Intravenous 4 times per day 12/02/14 1847     12/02/14 1830  metroNIDAZOLE (FLAGYL) IVPB 500 mg   Status:  Discontinued     500 mg 100 mL/hr over 60 Minutes Intravenous Every 8 hours 12/02/14 1816 12/02/14 1847   12/02/14 1815  vancomycin (VANCOCIN) 2,000 mg in sodium chloride 0.9 % 500 mL IVPB     2,000 mg 250 mL/hr over 120 Minutes Intravenous  Once 12/02/14 1814 12/02/14 2249   12/02/14 1815  ciprofloxacin (CIPRO) IVPB 400 mg  Status:  Discontinued     400 mg 200 mL/hr over 60 Minutes Intravenous Every 12 hours 12/02/14 1816 12/02/14 1847   12/02/14 1800  metroNIDAZOLE (FLAGYL) IVPB 500 mg  Status:  Discontinued     500 mg 100 mL/hr over 60 Minutes Intravenous Every 8 hours 12/02/14 1649 12/02/14 1733   12/02/14 1700  ciprofloxacin (CIPRO) IVPB 200 mg  Status:  Discontinued     200 mg 100 mL/hr over 60 Minutes Intravenous Every 12 hours 12/02/14 1646 12/02/14 1733       Note: Portions of this report may have been transcribed using voice recognition software. Every effort was made to ensure accuracy; however, inadvertent computerized transcription errors may be present.   Any transcriptional errors that result from this process are unintentional.     [name], M.D., F.A.C.S. Gastrointestinal and Minimally Invasive Surgery [facility] [address], [place], [place] [address] [phone] Main / Paging   12/04/2014

## 2014-12-04 NOTE — Progress Notes (Signed)
Patient went to OR at this time,blood transfusion still running at 150cc/hr,endorsed to OR nurse,patient needing one more unit.

## 2014-12-04 NOTE — Anesthesia Postprocedure Evaluation (Signed)
  Anesthesia Post-op Note  Patient: Financial planner  Procedure(s) Performed: Procedure(s): LAPROSCOPIC LYSIS OF ADHESIONS, SPLENIC MOBILIZATION, RELOCATION OF COLOSTOMY, DEBRIDEMENT INITIAL COLOSTOMY SITE (N/A)  Patient Location: PACU  Anesthesia Type:General  Level of Consciousness: awake and alert   Airway and Oxygen Therapy: Patient Spontanous Breathing and Patient connected to nasal cannula oxygen  Post-op Pain: none  Post-op Assessment: Post-op Vital signs reviewed, Patient's Cardiovascular Status Stable, Respiratory Function Stable, Patent Airway and No signs of Nausea or vomiting  Post-op Vital Signs: Reviewed and stable  Last Vitals:  Filed Vitals:   12/04/14 1745  BP: 118/64  Pulse: 74  Temp: 36.4 C  Resp: 21    Complications: No apparent anesthesia complications

## 2014-12-04 NOTE — Transfer of Care (Signed)
Immediate Anesthesia Transfer of Care Note  Patient: [name] Compere  Procedure(s) Performed: Procedure(s): LAPROSCOPIC LYSIS OF ADHESIONS, SPLENIC MOBILIZATION, RELOCATION OF COLOSTOMY, DEBRIDEMENT INITIAL COLOSTOMY SITE (N/A)  Patient Location: PACU  Anesthesia Type:General  Level of Consciousness: awake, alert , oriented and patient cooperative  Airway & Oxygen Therapy: Patient Spontanous Breathing and Patient connected to face mask oxygen  Post-op Assessment: Report given to RN, Post -op Vital signs reviewed and stable and Patient moving all extremities X 4  Post vital signs: stable  Last Vitals:  Filed Vitals:   12/04/14 1330  BP: 107/51  Pulse: 71  Temp: 36.8 C  Resp: 17    Complications: No apparent anesthesia complications

## 2014-12-04 NOTE — Progress Notes (Signed)
INPATIENT PROGRESS NOTE  Assessment:  59 y.o. year old POD 68 s/p recurrent endometrioid endometrial/ovarian cancer s/p exploratory laparotomy, posterior supralevator pelvic exenteration, end colostomy, bladder repair, ureteral stenting on 11/09/14, now with skin cellulitis lateral to colostomy, concerning for possible fistula. Minimal to no improvement in cellulitis.  Plan: 1/ Wound infection/cellulitis: continueIV antibiotics. Plan for OR today with Dr Johney Maine to explore parastomal sub Q tissues, evaluate for fistula and possibly relocate stoma. Draw blood cultures if spikes temp >100.4. CT abdo/pelvis showed no abscess.  2/ Chronic neutropenia (idiopathic): Dr Alvy Bimler is followup. Appreciate this. Granix prn pending WBC and clinical picture.  3/ Complex bladder repair with ureteral stents, foley placement and JP drain: check JP creatinine today. If normal, will pull JP. Continue foley catheter. Do not remove. Plan on retrograde cystogram in 1-2 weeks. Stent removal 6 weeks postop pending CT urogram results.  4/ Left lower extremity edema: possible DVT. Patient is on anticoagulation (lovenox) prophylaxis. Will obtain LE dopplers and change to therapeutic lovenox if DVT identified.  5/ Acute blood loss anemia: will reassess CBC. On FeSO4 - continue. Transfuse 2 units today in anticipation for surgical losses today.  6/ type II DM: hold home meds while NPO.  7/ hypomagnesemia, hypokalemia-  Can take oral supplements.   HPI: [name] is a 59 y.o. 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 She then underwent a posterior supralevator exenteration with colostomy and bladder repair and stent placement on [id number] without complications. Her postoperative course was uncomplicate with the exception of development of postop anemia. Her final pathology revealed endometrioid  adenocarcinoma invading the vagina and rectum with negative margins on the specimen. The margin had been close (clinically) to the right pelvic sidewall which was m.  She is seen today for a postoperative check and to discuss her pathology results and ongoing plan. Since discharge from the hospital, she is feeling generally well. She has improving appetite, functioning stoma, and pain controlled with minimal PO medication. She has had increasing temp's particularly at night with a 100.0 temp last night (others in the 99's). She notices increased redness and discomfort around her stoma in past 48 hours and hardness underlying it.   Review of systems: Constitutional: She has no weight gain or weight loss. She has a low grade fever no chills. Eyes: No blurred vision Ears, Nose, Mouth, Throat: No dizziness, headaches or changes in hearing. No mouth sores. Cardiovascular: No chest pain, palpitations or edema. Respiratory: No shortness of breath, wheezing or cough Gastrointestinal: She has normal bowel movements trhough stoma without diarrhea or constipation. She denies any nausea or vomiting. She denies blood in her stool or heart burn. Genitourinary: She denies pelvic pain, pelvic pressure or changes in her urinary function. She has no hematuria, dysuria, or incontinence (foley in situ). She has no irregular vaginal bleeding or vaginal discharge Musculoskeletal: Denies muscle weakness or joint pains.  Skin: She has no skin changes, rashes or itching Neurological: Denies dizziness or headaches. No neuropathy, no numbness or tingling. Psychiatric: She denies depression or anxiety. Hematologic/Lymphatic: No easy bruising or bleeding  Allergies  Allergen Reactions  . Penicillins Swelling    Facial swelling  . Ultram [Tramadol] Hives  . Adhesive [Tape]     blisters  . Cefaclor Rash    Ceclor  . Erythromycin     gastritis  . Trimethoprim Rash  .  Sulfa Antibiotics Rash    Current  Facility-Administered Medications on File Prior to Encounter  Medication Dose Route Frequency Provider Last Rate Last Dose  . Tbo-Filgrastim (GRANIX) injection 480 mcg 480 mcg Subcutaneous Weekly Heath Lark, MD  480 mcg at 11/29/14 1338   Current Outpatient Prescriptions on File Prior to Encounter  Medication Sig Dispense Refill  . acetaminophen (TYLENOL) 500 MG tablet Take 2 tablets (1,000 mg total) by mouth 3 (three) times daily. 30 tablet 0  . Calcium Carbonate-Vitamin D (CALCIUM + D PO) Take 2 tablets by mouth daily.    . Canagliflozin (INVOKANA) 300 MG TABS Take 1 tablet by mouth every morning.     . Cholecalciferol (VITAMIN D3) 10000 UNITS capsule Take 10,000 Units by mouth once a week. Sundays    . docusate sodium (COLACE) 100 MG capsule Take 100 mg by mouth daily.    [name] Kitchen enoxaparin (LOVENOX) 40 MG/0.4ML injection Inject 0.4 mLs (40 mg total) into the skin daily. 21 Syringe 1  . ferrous sulfate 325 (65 FE) MG tablet Take 1 tablet (325 mg total) by mouth 2 (two) times daily with a meal. 60 tablet 3  . hydrochlorothiazide (HYDRODIURIL) 12.5 MG tablet Take 12.5 mg by mouth every morning.     [name] Kitchen ibuprofen (ADVIL,MOTRIN) 800 MG tablet     . levothyroxine (SYNTHROID) 175 MCG tablet Take 175 mcg by mouth daily before breakfast.    . metFORMIN (GLUCOPHAGE) 1000 MG tablet Take 1,000 mg by mouth 2 (two) times daily with a meal.     . Multiple Vitamin (MULTIVITAMIN WITH MINERALS) TABS tablet Take 1 tablet by mouth daily.    [name] Kitchen omega-3 acid ethyl esters (LOVAZA) 1 G capsule Take 1 g by mouth 2 (two) times daily.    [name] Kitchen omeprazole (PRILOSEC) 20 MG capsule Take 20 mg by mouth daily.    . ondansetron (ZOFRAN) 4 MG tablet Take 1 tablet (4 mg total) by mouth every 6 (six) hours as needed for nausea. 20 tablet 0  . ONETOUCH VERIO test strip     . oxyCODONE (OXY  IR/ROXICODONE) 5 MG immediate release tablet Take 1-2 tablets (5-10 mg total) by mouth every 4 (four) hours as needed for moderate pain, severe pain or breakthrough pain. 30 tablet 0  . Polyethyl Glycol-Propyl Glycol (SYSTANE OP) Apply 1 drop to eye daily as needed (Dry eyes).     [name] Kitchen PROVENTIL HFA 108 (90 BASE) MCG/ACT inhaler   0  . rosuvastatin (CRESTOR) 10 MG tablet Take 10 mg by mouth every evening.     . sitaGLIPtin (JANUVIA) 100 MG tablet Take 100 mg by mouth daily.    . Tbo-Filgrastim (GRANIX) 480 MCG/0.8ML SOSY injection Inject 480 mcg into the skin once a week.    . traMADol (ULTRAM) 50 MG tablet   0  . valsartan (DIOVAN) 320 MG tablet Take 320 mg by mouth every morning.      Past Medical History  Diagnosis Date  . Hypothyroidism 12/14/2011  . Benign essential HTN 12/14/2011  . Neutropenia   . Postmenopausal bleeding 09/05/2014  . Anemia     Planned Neupogen injections for chronic neutropenia  . Transfusion history     '06-s/p hysterectomy and staging  . Multiple thyroid nodules y-8    Managed by Dr. Harlow Asa  . GERD (gastroesophageal reflux disease)   . Polyp of duodenum y-2  . DM type 2 (diabetes mellitus, type 2) 12/14/2011  . Dysrhythmia     skipped beat with occasional coffee   . Hx of bronchitis   . Depression   .  Hiatal hernia y-12    hx of small hiatal hernia   . Neutropenia     chronic   . Cancer     '06-Ovarian/ Uterine Cancer-chemotherapy  . 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 Appendectomy    . Tonsillectomy    . Cholecystectomy      laparoscopic  . Eye surgery      pytosis of eyelids-child  . Colonoscopy with propofol N/A 08/21/2013    Procedure: COLONOSCOPY WITH PROPOFOL; Surgeon: Cleotis Nipper, MD; Location: [facility]; Service: Endoscopy;  Laterality: N/A;  . Abdominal hysterectomy      staging for Ovarian cancer  . Eus N/A 10/02/2014    Procedure: LOWER ENDOSCOPIC ULTRASOUND (EUS); Surgeon: Arta Silence, MD; Location: [facility]; Service: Endoscopy; Laterality: N/A;  . Robotic assisted lap vaginal hysterectomy N/A 11/19/2014    Procedure: ROBOTIC LYSIS OF ADHESIONS, CONVERTED TO LAPAROTOMY RADICAL UPPER VAGINECTOMY,LOW ANTERIOR BOWEL RESECTION, COLOSTOMY, BILATERAL URETERAL STENT PLACEMENT AND CYSTONOMY CLOSURE; Surgeon: Everitt Amber, MD; Location: [facility]; Service: Gynecology; Laterality: N/A;  . Ostomy N/A 11/19/2014    Procedure: OSTOMY; Surgeon: Michael [name], MD; Location: [facility]; Service: General; Laterality: N/A;    Family History  Problem Relation Age of Onset  . Cancer Mother     stomach ca  . Hypertension Mother   . Cancer Father     prostate ca  . Diabetes Father   . Diabetes Sister   . Hypertension Brother y-10    History   Social History  . Marital Status: Married    Spouse Name: N/A  . Number of Children: N/A  . Years of Education: N/A   Occupational History  . Not on file.   Social History Main Topics  . Smoking status: Never Smoker   . Smokeless tobacco: Never Used  . Alcohol Use: Yes     Comment: rare social  . Drug Use: No  . Sexual Activity: Not Currently   Other Topics Concern  . Not on file   Social History Narrative   CBC    Component Value Date/Time   WBC 4.5 12/04/2014 0404   WBC 2.1* 11/29/2014 1251   WBC 2.4* 08/25/2014 1541   RBC 2.50* 12/04/2014 0404   RBC 2.66* 11/29/2014 1251   RBC 4.75 08/25/2014 1541   HGB 6.5* 12/04/2014 0404   HGB 7.2* 11/29/2014 1251   HGB 13.0 08/25/2014 1541   HCT 21.8* 12/04/2014 0404   HCT 23.4* 11/29/2014 1251   HCT 41.3 08/25/2014 1541   PLT 523* 12/04/2014 0404   PLT 548* 11/29/2014 1251   MCV 87.2  12/04/2014 0404   MCV 88.0 11/29/2014 1251   MCV 86.8 08/25/2014 1541   MCH 26.0 12/04/2014 0404   MCH 27.1 11/29/2014 1251   MCH 27.5 08/25/2014 1541   MCHC 29.8* 12/04/2014 0404   MCHC 30.8* 11/29/2014 1251   MCHC 31.6* 08/25/2014 1541   RDW 17.6* 12/04/2014 0404   RDW 17.5* 11/29/2014 1251   LYMPHSABS 1.6 12/04/2014 0404   LYMPHSABS 0.6* 11/29/2014 1251   MONOABS 1.0 12/04/2014 0404   MONOABS 0.8 11/29/2014 1251   EOSABS 0.3 12/04/2014 0404   EOSABS 0.2 11/29/2014 1251   BASOSABS 0.0 12/04/2014 0404   BASOSABS 0.0 11/29/2014 1251    BMET    Component Value Date/Time   NA 137 12/03/2014 0408   NA 140 11/29/2014 1251   K 3.2* 12/03/2014 0408   K 3.5 11/29/2014 1251  CL 100* 12/03/2014 0408   CO2 28 12/03/2014 0408   CO2 30* 11/29/2014 1251   GLUCOSE 124* 12/03/2014 0408   GLUCOSE 168* 11/29/2014 1251   BUN 11 12/03/2014 0408   BUN 11.2 11/29/2014 1251   CREATININE 0.80 12/03/2014 0408   CREATININE 0.7 11/29/2014 1251   CREATININE 0.84 08/25/2014 1538   CALCIUM 8.4* 12/03/2014 0408   CALCIUM 9.3 11/29/2014 1251   GFRNONAA >60 12/03/2014 0408   GFRNONAA 76 08/25/2014 1538   GFRAA >60 12/03/2014 0408   GFRAA 87 08/25/2014 1538      Physical Exam: Blood pressure 136/60, pulse 94, temperature 98.2 F (36.8 C), temperature source Oral, resp. rate 18, height 5\' 2"  (1.575 m), weight 252 lb 12.8 oz (114.669 kg). General: Well dressed, well nourished in no apparent distress.  HEENT: Normocephalic and atraumatic, no lesions. Extraocular muscles intact. Sclerae anicteric. Pupils equal, round, reactive. No mouth sores or ulcers. Thyroid is normal size, not nodular, midline. Skin: No lesions or rashes. Breasts: deferred Lungs: Clear to auscultation bilaterally. No wheezes. Cardiovascular: Regular rate and rhythm. No murmurs or rubs. Abdomen: Soft, nontender, nondistended. No palpable masses. No hepatosplenomegaly. No ascites. Normal bowel sounds. No  hernias. Incision is healing with no drainage or erythema. No drainage. To the left of the colostomy (lateral border) is blanching erythema and underlying firmness of the subQ tissue, consistent with cellulitis. Stable size distribution from previously marked edge of erythema. No underlying fluctuance Genitourinary: deferred Extremities: No cyanosis, clubbing. LLE edema (L>R). No calf tenderness or erythema. No palpable cords. Psychiatric: Mood and affect are appropriate. Neurological: Awake, alert and oriented x 3. Sensation is intact, no neuropathy.  Musculoskeletal: No pain, normal strength and range of motion.  Donaciano Eva, MD      Routing History

## 2014-12-04 NOTE — Op Note (Signed)
12/04/2014  5:29 PM  PATIENT:  [name]  59 y.o. female  Patient Care Team: Leana Gamer, MD as PCP - General (Internal Medicine) [name], MD as Referring Physician (Obstetrics and Gynecology) Heath Lark, MD as Consulting Physician (Hematology and Oncology) Everitt Amber, MD as Consulting Physician (Obstetrics and Gynecology) Michael [name], MD as Consulting Physician (General Surgery)  PRE-OPERATIVE DIAGNOSIS:  Abdominal wall abscess, possible colostomy perforation/fistula POST-OPERATIVE DIAGNOSIS:    Abdominal wall abscess secondary to colostomy perforation  PROCEDURE:  Procedure(s): LAPROSCOPIC LYSIS OF ADHESIONS Laparoscopic mobilization of the splenic flexure RELOCATION OF COLOSTOMY DEBRIDEMENT INITIAL COLOSTOMY SITE  SURGEON:  Surgeon(s): Michael [name], MD  ASSISTANT: RN   ANESTHESIA:   local and general  EBL:  Total I/O In: 3040 [I.V.:1700; Blood:1340] Out: [id number] [Urine:1350; Stool:100; Blood:100]  Delay start of Pharmacological VTE agent (>24hrs) due to surgical blood loss or risk of bleeding:  no    DRAINS: REMOVED.   (PATIENT HAD RIGHT LOWER QUADRANT PELVIC DRAIN.  DRAIN & SERUM CREATININE WITHIN NORMAL LIMITS.  THEREFORE DRAIN REMOVED)  SPECIMEN:  Source of Specimen:  OLD COLOSTOMY  DISPOSITION OF SPECIMEN:  PATHOLOGY  COUNTS:  YES  PLAN OF CARE: Admit to inpatient   PATIENT DISPOSITION:  PACU - hemodynamically stable.  INDICATION: Morbidly obese neutropenic diabetic anemic female.  Had recurrence of endometrial cancer and pelvis adherent to the vagina and rectum.  Underwent partial vaginectomy and low anterior resection with end colostomy.  Also ureteral stents and bladder repair.  11/19/2014.  Patient recovered well & went home.  Week and a half later, patient developed pain and swelling and redness.  Refractory to oral antibiotics.  Admitted with IV antibiotics.  Began to express stool between the colostomy and abdominal wall.  Recognition made  for abdominal wall exploration with probable relocation of colostomy.  OR FINDINGS: Patient had left lateral perforation of the colon near one of the silk fascial Brooke stitches.  No ischemia.  No major purulence.  Moderate cellulitis but no necrosis.  No fasciitis.  DESCRIPTION:   Informed consent was confirmed.   The patient received IV antibiotics and underwent general anesthesia without any difficulty. The patient was positioned supine. Because the drain creatinine came back as less than one in the same as serum creatinine, I went and moved to drain as Dr. Denman [name] had been planning in her note today.  I did do digital rectal examination and noted a short rectal stump ending 3 cm from the anal verge.  No breakdown or perforation. A tunnel cuff sutures felt but no evidence of breakdown or leak.  Foley catheter was already in place placed. SCDs were active during the entire case.  Moderate volume of liquid stool was expressed from the abdominal wall laterally and a colostomy allowed to dehisce off the skin laterally.The abdomen was prepped and draped in a sterile fashion.  Lap pad and Ioban placed over the abdomen to cover the colostomy.  A surgical timeout confirmed our plan.  i placed a 5 mm port in the right upper quadrant using optical entry technique with the patient under reverse Trendelenburg & right side up.  Entry was clean.  I placed a 39mm port in the right midabdomen.  I did blunt and sharp dissection to free epiploic appendages and small bowel off the anterior abdominal wall.  I placed 5 mm ports in the right lower quadrant and later in the mid abdomen.  Right lateral port upsized to a 12 mm port.  Patient had  adhesions of jejunum to the end colostomy in the left lower quadrant.  We carefully freed that off.  End up going into the preperitoneal fat to get it cleared.  Eventually came to the end colostomy in the LLQ.  There is no evidence of any purulence or stool going into the peritoneal  cavity.  No ischemia or necrosis. No parastomal herniation. While there was postoperative changes there is no evidence of any inflammation or peritonitis. I proceeded to make a window between the mesocolon of the end colostomy and the colon itself.  I transected the mesocolon using a controlled scissors with cautery.  I used a 66mm Echelon stapler to staple across the colostomy flush with the peritoneum.  I then mobilized the descending colon and splenic flexure in a lateral to medial fashion.  I used bipolar Enseal to free the colonic attachments to the left kidney and around the splenic flexure.  Completed a good splenic flexure mobilization.  Did transect take some of the left colon mesentery near its base.  With that I got much better mobility.  I did careful inspection of small intestine and colon.  No evidence of injury or abnormality.  Hemostasis was good.  I did copious irrigation. Placed a clamp on an epiploic appendage at the end of colostomy.  I then created a new colostomy in the left upper quadrant transected a 5 cm skin disc.  Excised acylinder of subcutaneous fat down to the fascia which is about 8 cm deep as her abdomen wall was still thickened.  i split the anterior rectus fascia transversely.  Becamethrough the rectus muscle to the posterior rectal fascia.  I opened that sharply and dilated with fingers.  And up tracking dissecting some of the rectus fascia to get a good diameter.  I brought the end of the colon up through the wound.  Then chose able to eviscerate about  8 cm.  Mesocolon intact & fascia intact and viable.  Colon mesentery at 6:00 inferiorly.  No twisting or torsion. It was not tight at the fascia. There is much less thickened mesial colon and epiploic appendages in this part of the colon.    I went back with diagnostic laparoscopy.  Again no twist or torsion of the colon mesentery.  No blood stool or other abnormalities.  12 L fascial wound site with 0 Vicryl using a  laparoscopic suture passer to good result. Carbon dioxide Vicryl.  Ports removed.  Port sites closed with Monocryl suture.  Dressings and drains placed over port sites and the old Keenan Bachelor drain site.  I then matured the colostomy3-0 Vicryl sutures just at the subcutaneous tissues only. Do not place any fascial stitches to avoid any other potential perforation. Colon was a straight shot into the peritoneal cavity was no twisting or torsion.  Looked pink and viable.  Colostomy appliance placed.  We changed gloves and redraped around the old colostomy.  I excised the colostomy off the skin.  Freefreed the colostomy from its adhesions to theabdominal wall.  Removed it out completely.   I could see an opening in the left lateral aspect near one of the silk sutures.  There was no ischemia nor necrosis.  We irrigated copiously with several liters of saline.  I closed the posterior rectus fascia vertically with 0 Vicryl running suture.  I closed the anterior rectus fascia with #1 running PDS.  Re-did irrigation of the wound.  Again tissues were viable.  I packed the wound with two thirds  of a large Kerlix roll with a little bit chlorhexidine in it.  Sterile dressing applied.  Patient being extubated to go to recovery room.  I will discuss findings with family.      [name], M.D., F.A.C.S. Gastrointestinal and Minimally Invasive Surgery [facility] [address], [place], [place] [address] [phone] Main / Paging

## 2014-12-04 NOTE — Anesthesia Preprocedure Evaluation (Signed)
Anesthesia Evaluation  Patient identified by MRN, date of birth, ID band Patient awake    Reviewed: Allergy & Precautions, NPO status , Patient's Chart, lab work & pertinent test results  Airway Mallampati: II  TM Distance: >3 FB Neck ROM: Full    Dental no notable dental hx.    Pulmonary neg pulmonary ROS,  breath sounds clear to auscultation  Pulmonary exam normal       Cardiovascular hypertension, Pt. on medications Normal cardiovascular examRhythm:Regular Rate:Normal     Neuro/Psych negative neurological ROS  negative psych ROS   GI/Hepatic negative GI ROS, Neg liver ROS,   Endo/Other  diabetesHypothyroidism Morbid obesity  Renal/GU negative Renal ROS  negative genitourinary   Musculoskeletal negative musculoskeletal ROS (+)   Abdominal   Peds negative pediatric ROS (+)  Hematology  (+) anemia ,   Anesthesia Other Findings   Reproductive/Obstetrics negative OB ROS                             Anesthesia Physical Anesthesia Plan  ASA: III  Anesthesia Plan: General   Post-op Pain Management:    Induction: Intravenous  Airway Management Planned: Oral ETT  Additional Equipment:   Intra-op Plan:   Post-operative Plan: Possible Post-op intubation/ventilation  Informed Consent: I have reviewed the patients History and Physical, chart, labs and discussed the procedure including the risks, benefits and alternatives for the proposed anesthesia with the patient or authorized representative who has indicated his/her understanding and acceptance.   Dental advisory given  Plan Discussed with: CRNA and Surgeon  Anesthesia Plan Comments:         Anesthesia Quick Evaluation

## 2014-12-04 NOTE — Consult Note (Signed)
WOC requested for preoperative stoma site marking  Discussed surgical procedure and stoma creation with patient, patient currently has LLQ colostomy, going back to surgery today for possible relocation of her stoma due abdominal wall cellulitis.  Dr. Johney Maine has requested the marking to be supraumbilical to lessen the tension at the time of relocation.  Answered patient and family questions.   Examined patient lying, sitting, and standing in order to place the marking in the patient's visual field, away from any creases or abdominal contour issues and within the rectus muscle.   Marked for colostomy in the LUQ (per surgeon request) 9 cm above the umbilicus and [id number] to the left of umbilicus.    [facility] team will follow up with patient after surgery for continue ostomy care and teaching.  Izabelle Daus [place] RN,CWOCN [id number]

## 2014-12-05 ENCOUNTER — Encounter (HOSPITAL_COMMUNITY): Payer: Self-pay | Admitting: Surgery

## 2014-12-05 LAB — OTHER SOLSTAS TEST

## 2014-12-05 LAB — BASIC METABOLIC PANEL
Anion gap: 6 (ref 5–15)
BUN: 8 mg/dL (ref 6–20)
CHLORIDE: 103 mmol/L (ref 101–111)
CO2: 28 mmol/L (ref 22–32)
Calcium: 8.3 mg/dL — ABNORMAL LOW (ref 8.9–10.3)
Creatinine, Ser: 0.6 mg/dL (ref 0.44–1.00)
GFR calc Af Amer: >60 mL/min (ref >60)
GFR calc non Af Amer: >60 mL/min (ref >60)
Glucose, Bld: 123 mg/dL — ABNORMAL HIGH (ref 70–99)
POTASSIUM: 4.2 mmol/L (ref 3.5–5.1)
Sodium: 137 mmol/L (ref 135–145)

## 2014-12-05 LAB — GLUCOSE, CAPILLARY
GLUCOSE-CAPILLARY: 132 mg/dL — AB (ref 70–99)
GLUCOSE-CAPILLARY: 139 mg/dL — AB (ref 70–99)
GLUCOSE-CAPILLARY: 99 mg/dL (ref 70–99)
Glucose-Capillary: 110 mg/dL — ABNORMAL HIGH (ref 70–99)
Glucose-Capillary: 114 mg/dL — ABNORMAL HIGH (ref 70–99)

## 2014-12-05 LAB — CBC WITH DIFFERENTIAL/PLATELET
BASOS PCT: 1 % (ref 0–1)
Basophils Absolute: 0 10*3/uL (ref 0.0–0.1)
Eosinophils Absolute: 0.2 10*3/uL (ref 0.0–0.7)
Eosinophils Relative: 5 % (ref 0–5)
HEMATOCRIT: 29.4 % — AB (ref 36.0–46.0)
HEMOGLOBIN: 9 g/dL — AB (ref 12.0–15.0)
LYMPHS PCT: 21 % (ref 12–46)
Lymphs Abs: 0.8 10*3/uL (ref 0.7–4.0)
MCH: 26 pg (ref 26.0–34.0)
MCHC: 30.6 g/dL (ref 30.0–36.0)
MCV: 85 fL (ref 78.0–100.0)
MONO ABS: 0.7 10*3/uL (ref 0.1–1.0)
Monocytes Relative: 19 % — ABNORMAL HIGH (ref 3–12)
Neutro Abs: 2.1 10*3/uL (ref 1.7–7.7)
Neutrophils Relative %: 54 % (ref 43–77)
Platelets: 415 10*3/uL — ABNORMAL HIGH (ref 150–400)
RBC: 3.46 MIL/uL — AB (ref 3.87–5.11)
RDW: 17 % — ABNORMAL HIGH (ref 11.5–15.5)
WBC: 3.8 10*3/uL — ABNORMAL LOW (ref 4.0–10.5)
nRBC: 3 /100 WBC — ABNORMAL HIGH

## 2014-12-05 LAB — VANCOMYCIN, TROUGH: Vancomycin Tr: 17 ug/mL (ref 10.0–20.0)

## 2014-12-05 MED ORDER — HYDROMORPHONE 0.3 MG/ML IV SOLN
INTRAVENOUS | Status: DC
  Administered 2014-12-05: 2.4 mg via INTRAVENOUS
  Administered 2014-12-05: 0.5 mg via INTRAVENOUS
  Administered 2014-12-05: 21:00:00 via INTRAVENOUS
  Administered 2014-12-05: 3.5 mg via INTRAVENOUS
  Administered 2014-12-05: 13:00:00 via INTRAVENOUS
  Administered 2014-12-06: 0.3 mg via INTRAVENOUS
  Administered 2014-12-06: 2.1 mg via INTRAVENOUS
  Administered 2014-12-06: 13:00:00 via INTRAVENOUS
  Administered 2014-12-06: 5.4 mg via INTRAVENOUS
  Administered 2014-12-06: 3 mg via INTRAVENOUS
  Administered 2014-12-06: 2.4 mg via INTRAVENOUS
  Administered 2014-12-06: 2.1 mg via INTRAVENOUS
  Administered 2014-12-07: 0.9 mg via INTRAVENOUS
  Administered 2014-12-07: 3 mg via INTRAVENOUS
  Administered 2014-12-07: 1.5 mg via INTRAVENOUS
  Administered 2014-12-07: 1.2 mg via INTRAVENOUS
  Administered 2014-12-07: 1.8 mg via INTRAVENOUS
  Administered 2014-12-07: 02:00:00 via INTRAVENOUS
  Administered 2014-12-07: 2.1 mg via INTRAVENOUS
  Administered 2014-12-07: 20:00:00 via INTRAVENOUS
  Administered 2014-12-07: 1.5 mg via INTRAVENOUS
  Administered 2014-12-08: 20:00:00 via INTRAVENOUS
  Administered 2014-12-08: 0.6 mg via INTRAVENOUS
  Administered 2014-12-08: 1.8 mg via INTRAVENOUS
  Administered 2014-12-08: 1.2 mg via INTRAVENOUS
  Administered 2014-12-08: 0.3 mg via INTRAVENOUS
  Administered 2014-12-08: 0.9 mg via INTRAVENOUS
  Administered 2014-12-09: 1.5 mg via INTRAVENOUS
  Administered 2014-12-09: 1.8 mg via INTRAVENOUS
  Filled 2014-12-05 (×6): qty 25

## 2014-12-05 MED ORDER — LACTATED RINGERS IV BOLUS (SEPSIS): 1000.0000 mL | Freq: Three times a day (TID) | INTRAVENOUS | Status: AC | PRN

## 2014-12-05 MED ORDER — NALOXONE HCL 0.4 MG/ML IJ SOLN
0.4000 mg | INTRAMUSCULAR | Status: DC | PRN
Start: 2014-12-05 — End: 2014-12-09

## 2014-12-05 MED ORDER — SODIUM CHLORIDE 0.9 % IJ SOLN: 9.0000 mL | INTRAMUSCULAR | Status: DC | PRN

## 2014-12-05 MED ORDER — ENOXAPARIN SODIUM 40 MG/0.4ML ~~LOC~~ SOLN
40.0000 mg | SUBCUTANEOUS | Status: DC
  Administered 2014-12-05 – 2014-12-12 (×8): 40 mg via SUBCUTANEOUS
  Filled 2014-12-05 (×8): qty 0.4

## 2014-12-05 MED ORDER — POTASSIUM CHLORIDE 20 MEQ/15ML (10%) PO SOLN
40.0000 meq | Freq: Once | ORAL | Status: AC
  Administered 2014-12-05: 40 meq via ORAL
  Filled 2014-12-05: qty 30

## 2014-12-05 MED ORDER — ONDANSETRON HCL 4 MG/2ML IJ SOLN
4.0000 mg | Freq: Four times a day (QID) | INTRAMUSCULAR | Status: DC | PRN
  Administered 2014-12-07 – 2014-12-08 (×2): 4 mg via INTRAVENOUS
  Filled 2014-12-05 (×2): qty 2

## 2014-12-05 NOTE — Progress Notes (Signed)
ANTIBIOTIC CONSULT NOTE - Follow up  Pharmacy Consult for Vancomycin Indication: wound infection/cellulitis/neutropenia  Allergies  Allergen Reactions  . Penicillins Swelling    Facial swelling  . Ultram [Tramadol] Hives  . Adhesive [Tape]     blisters  . Cefaclor Rash    Ceclor  . Erythromycin     Gastritis, abd cramps  . Trimethoprim Rash  . Sulfa Antibiotics Rash    Patient Measurements: Height: 5\' 2"  (157.5 cm) Weight: 250 lb (113.399 kg) IBW/kg (Calculated) : 50.1   Vital Signs: Temp: 98.3 F (36.8 C) (05/05 0458) Temp Source: Oral (05/05 0458) BP: 116/75 mmHg (05/05 0458) Pulse Rate: 78 (05/05 0458) Intake/Output from previous day: 05/04 0701 - 05/05 0700 In: 6072.3 [I.V.:4732.3; Blood:1340] Out: [id number] [Urine:3475; Stool:100; Blood:100] Intake/Output from this shift: Total I/O In: 240 [P.O.:240] Out: 1000 [Urine:1000]  Labs:  Recent Labs  12/03/14 0408 12/04/14 0404 12/04/14 0850 12/04/14 1342 12/04/14 1600 12/05/14 0432  WBC 5.5 4.5  --   --   --  3.8*  HGB 6.0* 6.5*  --  8.8* 9.5* 9.0*  PLT 442* 523*  --   --   --  415*  CREATININE 0.80  --  0.72  --   --  0.60   Estimated Creatinine Clearance: 89 mL/min (by C-G formula based on Cr of 0.6).  Recent Labs  12/05/14 0432  [place]     Microbiology: Recent Results (from the past 720 hour(s))  MRSA PCR Screening     Status: None   Collection Time: 11/19/14  9:08 PM  Result Value Ref Range Status   MRSA by PCR NEGATIVE NEGATIVE Final    Comment:        The GeneXpert MRSA Assay (FDA approved for NASAL specimens only), is one component of a comprehensive MRSA colonization surveillance program. It is not intended to diagnose MRSA infection nor to guide or monitor treatment for MRSA infections.   Surgical pcr screen     Status: None   Collection Time: 12/04/14 10:09 AM  Result Value Ref Range Status   MRSA, PCR NEGATIVE NEGATIVE Final   Staphylococcus aureus NEGATIVE NEGATIVE  Final    Comment:        The Xpert SA Assay (FDA approved for NASAL specimens in patients over 4 years of age), is one component of a comprehensive surveillance program.  Test performance has been validated by [facility] for patients greater than or equal to 28 year old. It is not intended to diagnose infection nor to guide or monitor treatment.    Assessment: 59 y.o. year old with recurrent endometrioid endometrial/ovarian cancer s/p exploratory laparotomy, posterior supralevator pelvic exenteration, end colostomy, bladder repair, ureteral stenting on 11/09/14, now with surgical site infection peri-stomal in setting of chronic neutropenia. Clinically stable with no signs of sepsis or SIRS. Wound infection/cellulitis: admit to hospital of IV antibiotics.  5/4: To OR. Post-op diagnosis: abd wall abscess secondary to colostomy perforation. Colostomy was relocated.  Today, 12/05/2014: Afebrile since admit. WBC just below normal(chronic idiopathic neutropenia) SCr wnl and stable, CrCl 89CG. No cultures. Vanc trough = 81mcg/ml(drawn ~9.5 hrs after a dose) on 1g q12h.  Goal of Therapy:  Vancomycin trough level 15-20 mcg/ml  Antibiotics per renal function and indication  Plan:  Cont Vanc 1g IV q12h [place] 600mg  IV q6h per MD. Measure Vanc trough at steady state. Follow up renal fxn, culture results, and clinical course.  Romeo Rabon, PharmD, pager [phone]. 12/05/2014,11:46 AM.

## 2014-12-05 NOTE — Addendum Note (Signed)
Addendum  created 12/05/14 1400 by Lollie Sails, CRNA   Modules edited: Anesthesia Events

## 2014-12-05 NOTE — Consult Note (Addendum)
WOC ostomy follow up Stoma type/location: LUQ Colostomy. Met patient and husband while ambulating in hall.  Very tired.  Assisted to room and to chair. Stomal assessment/size: Not measured today Peristomal assessment: Not seen today Treatment options for stomal/peristomal skin:None implemented today Output serosanguinous, scant Ostomy pouching: 1pc./2pc.  Education provided: Support provided for patient in the post-operative phase.  Discussed past two days.  Expectations of NPWT which may be applied tomorrow. Patient and husband appreciative of supportive visit. Enrolled patient in [facility] Discharge program: Not revised for this stoma.  Will need to be done on a subsequent visit. [place] nursing team will follow, and will remain available to this patient, the nursing, srurgical and medical teams.  A mattress replacement is ordered for her as she had one prior to the second surgery due to body habitus and comfort. Thanks, Maudie Flakes, MSN, RN, [place], [place], [place] [phone])

## 2014-12-05 NOTE — Progress Notes (Addendum)
[facility]  [address] [place], [place] [address] Phone: [phone] FAX: [phone]    [name] [id number] August 16, 1955  CARE TEAM:  PCP: [name]., MD  Outpatient Care Team: Patient Care Team: [name] Gamer, MD as PCP - General (Internal Medicine) [name] Records, MD as Referring Physician (Obstetrics and Gynecology) [name] Lark, MD as Consulting Physician (Hematology and Oncology) [name] Amber, MD as Consulting Physician (Obstetrics and Gynecology) [name] [name], MD as Consulting Physician (General Surgery)  Inpatient Treatment Team: Treatment Team: Attending Provider: Everitt Amber, MD; Consulting Physician: [name] [name], MD; Technician: [name], NT; Respiratory Therapist: Lita [name], RRT; Physician Assistant: [name] Jumbo, PA-C; Consulting Physician: [name] Lark, MD; Registered Nurse: [name] Mode, RN; Technician: [name], NT; Nurse Practitioner: [name] Gibbs, NP; Registered Nurse: [name] Labrum, RN; Technician: [name], NT; Technician: [name], NT  Problem List:   Principal Problem:   Cellulitis of left abdominal wall near colostomy Active Problems:   Chronic neutropenia   1 Day Post-Op    12/04/2014 POST-OPERATIVE DIAGNOSIS:   Abdominal wall abscess secondary to colostomy perforation  PROCEDURE: Procedure(s): LAPROSCOPIC LYSIS OF ADHESIONS Laparoscopic mobilization of the splenic flexure RELOCATION OF COLOSTOMY DEBRIDEMENT INITIAL COLOSTOMY SITE  SURGEON: Surgeon(s): [name] [name], MD     PRIOR Procedure(s) 11/19/2014 POST-OPERATIVE DIAGNOSIS: 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  PROCEDURE:  LAPAROSCOPIC/ROBOTIC LYSIS OF ADHESIONS*^ EXPLORATORY LAPAROTOMY^ RESECTION OF PELVIC MASS^ RADICAL UPPER VAGINECTOMY^ LOW ANTERIOR RECTOSIGMOID RESECTION* END DESCENDING COLOSTOMY* BILATERAL URETERAL STENT PLACEMENT^ CYSTOTOMY CLOSURE^  SURGEON:  : [name] Amber, MD^ [name] [name], MD*  ASSISTANT: [name] Crocker, MD  PATHOLOGY: Colon, segmental resection for tumor, rectosigmoid colon with upper vagina  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 TWO LYMPH NODES, NEGATIVE FOR ATYPIA OR MALIGNANCY (0/2). RESECTION MARGINS, NEGATIVE FOR ATYPIA OR MALIGNANCY.  Microscopic Comment Please see [name] description for details. [name] LI MD Pathologist, Electronic Signature (Case signed 11/21/2014) Specimen [name] and Clinical Information Specimen(s) Obtained: Colon, segmental resection for tumor, rectosigmoid colon with upper vagina Specimen Clinical Information endometrial cancer (kp) [name] The specimen is received in formalin labeled rectosigmoid colon and upper vagina, and consists of an 18.1 cm in length portion of colon, with the proximal margin stapled and the distal margin open. On the anterior aspect of the colon, there is a 2.7 x 2.6 cm slightly disrupted circular portion of gray-brown mucosa designated by a stitch, which is clinically stated as posterior vaginal wall. The resection margin surrounding the vaginal tissue is inked black, and the distal resection margin is inked black for orientation. The soft tissue surrounding the vaginal tissue along the right side is slightly firm. The lumen of the colon contains a small amount of green-brown fecal material, and the mucosa is tan-pink with normal folding. No mucosal lesions are grossly identified, however, palpating the colon, which overlies the indurated adipose tissue reveals a firm nodule within the possible submucosa and muscularis. The nodule measures approximately 0.6 cm, and is 2.9 cm from the distal resection margin. Sectioning through the distal aspect of the colon and underlying soft tissue reveals a 4.6 x 4.5 x 4.0 cm tan-red, hemorrhagic, softened lesion within the 1 of 2 FINAL for  [name], [name] ([id number]) [name](continued) underlying adipose tissue. The lesion invades into the overlying muscularis propria at the area previously noted. The colon wall measures 0.5 cm in thickness. Three tan-pink possible lymph nodes are identified, ranging from 0.2 cm  to 0.5 cm in greatest dimension. Please note the patient is status post chemotherapy. Representative sections are submitted in twelve cassettes. A = proximal resection margin. B, C = distal resection margin. D - F = lesion to overlying colon. G, H = lesion. I, J = vaginal mucosa. K = uninvolved mucosa. L = three possible lymph nodes. (KL:ecj 11/20/2014) Report signed out from the following location(s) Technical component and interpretation was performed at [facility]  Cellulitis with abscess lateral to colostomy with stool due to absominal wall perforation  Plan:  IV ABx.  Vanco/Clindamycin with ### ABx allergies.    Dressing changes to old colostomy site, starting today.  Switch to wound vac MWF starting tomorrow if cellulitis reduced more tomorrow since no major purulence/necrosis  Anemia.  Transfused yesterday - follow - transfuse if Hgb <7 and symptomatic if GynOnc Dr Rossi/LJM agree.  ?Reinvolve Hematology/oncology - CSF to help stim BMarrow?  Pt asymptomatic.    Low K & Mg - replacing  Pain control -  Continue tylenol RTC.  PO oxycodone w IV Dilaudid backup.  Hold NSAIDs with falling Hgb  Foley - DO NOT REMOVE due to bladder repair & ureteral stenting.  She had complex cystotomy repair (trigone and base) and ureteral stenting. Will perform retrograde cystogram in 12May will d.c foley at that time IF no leak noted. Remove ureteral stents at 6 weeks if no stricture/leak on CT urogram.  DRAIN:  Removed 12/04/2014.  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 TWO LYMPH  NODES, NEGATIVE FOR ATYPIA OR MALIGNANCY (0/2). I am skeptical that only 2 LN were found in 18cm of intact mesorectum & mesocolon although not a colon adenoCA = may not be usually drainage point for the recurrent endometriod CA  RESECTION MARGINS, NEGATIVE FOR ATYPIA OR MALIGNANCY.  Glc control - ? SSI.  Restarting PO hypoglycemics  HTN - OK off meds - restart gradually.  GERD - protonix daily  -VTE prophylaxis- SCDs, etc  -mobilize as tolerated to help recovery    I updated the patient's status to the patient.   Recommendations were made.  Questions were answered.  The patient expressed understanding & appreciation.   [name], M.D., F.A.C.S. Gastrointestinal and Minimally Invasive Surgery [facility] [address], [place], [place] [address] [phone] Main / Paging   12/05/2014  Subjective:  Events noted with stool expressed from SQ at 9 o'clock border of colostomy. Pain and swelling returned 3W onc floor Pain controlled No n/v.  Tol solids Mild abdominal cramping  Objective:  Vital signs:  Filed Vitals:   12/04/14 2010 12/04/14 2210 12/05/14 0229 12/05/14 0458  BP: 112/60 110/54 116/77 116/75  Pulse: 66 66 80 78  Temp: 97.8 F (36.6 C) 97.5 F (36.4 C) 98 F (36.7 C) 98.3 F (36.8 C)  TempSrc: Oral Oral Oral Oral  Resp: 16 12 16 16   Height:      Weight:      SpO2: 100% 99% 100% 100%    Last BM Date: 12/04/14  Intake/Output   Yesterday:  05/04 0701 - 05/05 0700 In: 1308 [I.V.:4071; Blood:1340] Out: 6578 [Urine:3475; Stool:100; Blood:100] This shift:     Bowel function:  Flatus: yes  BM: yes  Drain: thinly serosanguinous  Physical Exam:  General: Pt awake/alert/oriented in no acute distress.  Tired Eyes: PERRL, normal EOM.  Sclera clear.  No icterus Neuro: CN II-XII intact w/o focal sensory/motor deficits. Lymph: No head/neck/groin lymphadenopathy Psych:  No delerium/psychosis/paranoia HENT:  Normocephalic, Mucus membranes moist.  No thrush.   Neck: Supple, No tracheal deviation Chest: No chest wall pain w good excursion CV:  Pulses intact.  Regular rhythm MS: Normal AROM mjr joints.  No obvious deformity Abdomen: Soft.  Nondistended.  Incision closed.   Tender at LUQ of colostomy w induration & erythema.  Stable from yesterday AM but worse from last night.  No evidence of peritonitis.  No incarcerated hernias. Ext:  SCDs BLE.  No mjr edema.  No cyanosis Skin: No petechiae / purpura  Results:   Labs: Results for orders placed or performed during the hospital encounter of 12/02/14 (from the past 48 hour(s))  CBC WITH DIFFERENTIAL     Status: Abnormal   Collection Time: 12/04/14  4:04 AM  Result Value Ref Range   WBC 4.5 4.0 - 10.5 K/uL   RBC 2.50 (L) 3.87 - 5.11 MIL/uL   Hemoglobin 6.5 (LL) 12.0 - 15.0 g/dL    Comment: CRITICAL VALUE NOTED.  VALUE IS CONSISTENT WITH PREVIOUSLY REPORTED AND CALLED VALUE.   HCT 21.8 (L) 36.0 - 46.0 %   MCV 87.2 78.0 - 100.0 fL   MCH 26.0 26.0 - 34.0 pg   MCHC 29.8 (L) 30.0 - 36.0 g/dL   RDW 17.6 (H) 11.5 - 15.5 %   Platelets 523 (H) 150 - 400 K/uL   Neutrophils Relative % 35 (L) 43 - 77 %   Lymphocytes Relative 36 12 - 46 %   Monocytes Relative 22 (H) 3 - 12 %   Eosinophils Relative 6 (H) 0 - 5 %   Basophils Relative 1 0 - 1 %   nRBC 3 (H) 0 /100 WBC   Neutro Abs 1.6 (L) 1.7 - 7.7 K/uL   Lymphs Abs 1.6 0.7 - 4.0 K/uL   Monocytes Absolute 1.0 0.1 - 1.0 K/uL   Eosinophils Absolute 0.3 0.0 - 0.7 K/uL   Basophils Absolute 0.0 0.0 - 0.1 K/uL   RBC Morphology POLYCHROMASIA PRESENT     Comment: RARE NRBCs   WBC Morphology MILD LEFT SHIFT (1-5% METAS, OCC MYELO, OCC BANDS)   Creatinine, body fluid     Status: None   Collection Time: 12/04/14  5:28 AM  Result Value Ref Range   Creat, Fluid 0.8 mg/dL    Comment: (NOTE) No normal range established for this test Results should be evaluated in conjunction with serum values Performed at  [facility]    Fluid Type-FCRE JP DRAINAGE     Comment: CORRECTED ON 05/04 AT 6759: PREVIOUSLY REPORTED AS PERITONEAL CAVITY  Glucose, capillary     Status: Abnormal   Collection Time: 12/04/14  8:45 AM  Result Value Ref Range   Glucose-Capillary 123 (H) 70 - 99 mg/dL   Comment 1 Notify RN    Comment 2 Document in Chart   Type and screen     Status: None (Preliminary result)   Collection Time: 12/04/14  8:50 AM  Result Value Ref Range   ABO/RH(D) A POS    Antibody Screen NEG    Sample Expiration 12/07/2014    Unit Number [id number]    Blood Component Type RED CELLS,LR    Unit division 00    Status of Unit ISSUED    Transfusion Status OK TO TRANSFUSE    Crossmatch Result Compatible    Unit Number [id number]  Blood Component Type RED CELLS,LR    Unit division 00    Status of Unit ISSUED    Transfusion Status OK TO TRANSFUSE    Crossmatch Result Compatible    Unit Number [id number]    Blood Component Type RED CELLS,LR    Unit division 00    Status of Unit ALLOCATED    Transfusion Status OK TO TRANSFUSE    Crossmatch Result Compatible    Unit Number [id number]    Blood Component Type RED CELLS,LR    Unit division 00    Status of Unit ISSUED    Transfusion Status OK TO TRANSFUSE    Crossmatch Result Compatible   Basic metabolic panel     Status: Abnormal   Collection Time: 12/04/14  8:50 AM  Result Value Ref Range   Sodium 135 135 - 145 mmol/L   Potassium 4.0 3.5 - 5.1 mmol/L    Comment: DELTA CHECK NOTED REPEATED TO VERIFY NO VISIBLE HEMOLYSIS    Chloride 103 101 - 111 mmol/L   CO2 26 22 - 32 mmol/L   Glucose, Bld 145 (H) 70 - 99 mg/dL   BUN 11 6 - 20 mg/dL   Creatinine, Ser 0.72 0.44 - 1.00 mg/dL   Calcium 8.4 (L) 8.9 - 10.3 mg/dL   GFR calc non Af Amer >60 >60 mL/min   GFR calc Af Amer >60 >60 mL/min    Comment: (NOTE) The eGFR has been calculated using the CKD EPI equation. This calculation has not been validated in all clinical  situations. eGFR's persistently <90 mL/min signify possible Chronic Kidney Disease.    Anion gap 6 5 - 15  Surgical pcr screen     Status: None   Collection Time: 12/04/14 10:09 AM  Result Value Ref Range   MRSA, PCR NEGATIVE NEGATIVE   Staphylococcus aureus NEGATIVE NEGATIVE    Comment:        The Xpert SA Assay (FDA approved for NASAL specimens in patients over 7 years of age), is one component of a comprehensive surveillance program.  Test performance has been validated by [facility] for patients greater than or equal to 26 year old. It is not intended to diagnose infection nor to guide or monitor treatment.   Glucose, capillary     Status: Abnormal   Collection Time: 12/04/14 12:13 PM  Result Value Ref Range   Glucose-Capillary 106 (H) 70 - 99 mg/dL   Comment 1 Notify RN    Comment 2 Document in Chart   I-STAT 4, (NA,K, GLUC, HGB,HCT)     Status: Abnormal   Collection Time: 12/04/14  1:42 PM  Result Value Ref Range   Sodium 138 135 - 145 mmol/L   Potassium 4.5 3.5 - 5.1 mmol/L   Glucose, Bld 102 (H) 70 - 99 mg/dL   HCT 26.0 (L) 36.0 - 46.0 %   Hemoglobin 8.8 (L) 12.0 - 15.0 g/dL  I-STAT 4, (NA,K, GLUC, HGB,HCT)     Status: Abnormal   Collection Time: 12/04/14  4:00 PM  Result Value Ref Range   Sodium 136 135 - 145 mmol/L   Potassium 4.4 3.5 - 5.1 mmol/L   Glucose, Bld 138 (H) 70 - 99 mg/dL   HCT 28.0 (L) 36.0 - 46.0 %   Hemoglobin 9.5 (L) 12.0 - 15.0 g/dL  Glucose, capillary     Status: Abnormal   Collection Time: 12/04/14  5:45 PM  Result Value Ref Range   Glucose-Capillary 144 (H) 70 - 99 mg/dL  Comment 1 Notify RN    Comment 2 Document in Chart   Glucose, capillary     Status: Abnormal   Collection Time: 12/04/14  8:10 PM  Result Value Ref Range   Glucose-Capillary 106 (H) 70 - 99 mg/dL   Comment 1 Notify RN   Glucose, capillary     Status: None   Collection Time: 12/05/14 12:08 AM  Result Value Ref Range   Glucose-Capillary 99 70 - 99 mg/dL    Comment 1 Notify RN   CBC WITH DIFFERENTIAL     Status: Abnormal   Collection Time: 12/05/14  4:32 AM  Result Value Ref Range   WBC 3.8 (L) 4.0 - 10.5 K/uL   RBC 3.46 (L) 3.87 - 5.11 MIL/uL   Hemoglobin 9.0 (L) 12.0 - 15.0 g/dL   HCT 29.4 (L) 36.0 - 46.0 %   MCV 85.0 78.0 - 100.0 fL   MCH 26.0 26.0 - 34.0 pg   MCHC 30.6 30.0 - 36.0 g/dL   RDW 17.0 (H) 11.5 - 15.5 %   Platelets 415 (H) 150 - 400 K/uL   Neutrophils Relative % 54 43 - 77 %   Lymphocytes Relative 21 12 - 46 %   Monocytes Relative 19 (H) 3 - 12 %   Eosinophils Relative 5 0 - 5 %   Basophils Relative 1 0 - 1 %   nRBC 3 (H) 0 /100 WBC   Neutro Abs 2.1 1.7 - 7.7 K/uL   Lymphs Abs 0.8 0.7 - 4.0 K/uL   Monocytes Absolute 0.7 0.1 - 1.0 K/uL   Eosinophils Absolute 0.2 0.0 - 0.7 K/uL   Basophils Absolute 0.0 0.0 - 0.1 K/uL   RBC Morphology POLYCHROMASIA PRESENT     Comment: RARE NRBCs   WBC Morphology MILD LEFT SHIFT (1-5% METAS, OCC MYELO, OCC BANDS)   Basic metabolic panel     Status: Abnormal   Collection Time: 12/05/14  4:32 AM  Result Value Ref Range   Sodium 137 135 - 145 mmol/L   Potassium 4.2 3.5 - 5.1 mmol/L   Chloride 103 101 - 111 mmol/L   CO2 28 22 - 32 mmol/L   Glucose, Bld 123 (H) 70 - 99 mg/dL   BUN 8 6 - 20 mg/dL   Creatinine, Ser 0.60 0.44 - 1.00 mg/dL   Calcium 8.3 (L) 8.9 - 10.3 mg/dL   GFR calc non Af Amer >60 >60 mL/min   GFR calc Af Amer >60 >60 mL/min    Comment: (NOTE) The eGFR has been calculated using the CKD EPI equation. This calculation has not been validated in all clinical situations. eGFR's persistently <90 mL/min signify possible Chronic Kidney Disease.    Anion gap 6 5 - 15  Glucose, capillary     Status: Abnormal   Collection Time: 12/05/14  4:50 AM  Result Value Ref Range   Glucose-Capillary 110 (H) 70 - 99 mg/dL   Comment 1 Notify RN     Imaging / Studies: No results found.  Medications / Allergies: per chart  Antibiotics: Anti-infectives    Start     Dose/Rate  Route Frequency Ordered Stop   12/05/14 0600  gentamicin (GARAMYCIN) IVPB 100 mg  Status:  Discontinued    Comments:  Pharmacy may adjust dosing strength, schedule, rate of infusion, etc as needed to optimize therapy Send with patient on call to the OR.  Anesthesia to complete antibiotic administration <11mn [facility]   100 mg 200 mL/hr  over 30 Minutes Intravenous On call to O.R. 12/04/14 1346 12/04/14 1352   12/04/14 1400  gentamicin (GARAMYCIN) 380 mg in dextrose 5 % 100 mL IVPB     5 mg/kg  75.4 kg (Adjusted) 109.5 mL/hr over 60 Minutes Intravenous  Once 12/04/14 1349 12/04/14 1430   12/03/14 0600  vancomycin (VANCOCIN) IVPB 1000 mg/200 mL premix     1,000 mg 200 mL/hr over 60 Minutes Intravenous Every 12 hours 12/02/14 1817     12/02/14 1930  clindamycin (CLEOCIN) IVPB 600 mg     600 mg 100 mL/hr over 30 Minutes Intravenous 4 times per day 12/02/14 1847     12/02/14 1830  metroNIDAZOLE (FLAGYL) IVPB 500 mg  Status:  Discontinued     500 mg 100 mL/hr over 60 Minutes Intravenous Every 8 hours 12/02/14 1816 12/02/14 1847   12/02/14 1815  vancomycin (VANCOCIN) 2,000 mg in sodium chloride 0.9 % 500 mL IVPB     2,000 mg 250 mL/hr over 120 Minutes Intravenous  Once 12/02/14 1814 12/02/14 2249   12/02/14 1815  ciprofloxacin (CIPRO) IVPB 400 mg  Status:  Discontinued     400 mg 200 mL/hr over 60 Minutes Intravenous Every 12 hours 12/02/14 1816 12/02/14 1847   12/02/14 1800  metroNIDAZOLE (FLAGYL) IVPB 500 mg  Status:  Discontinued     500 mg 100 mL/hr over 60 Minutes Intravenous Every 8 hours 12/02/14 1649 12/02/14 1733   12/02/14 1700  ciprofloxacin (CIPRO) IVPB 200 mg  Status:  Discontinued     200 mg 100 mL/hr over 60 Minutes Intravenous Every 12 hours 12/02/14 1646 12/02/14 1733       Note: Portions of this report may have been transcribed using voice recognition software. Every effort was made to ensure accuracy; however, inadvertent computerized  transcription errors may be present.   Any transcriptional errors that result from this process are unintentional.     [name], M.D., F.A.C.S. Gastrointestinal and Minimally Invasive Surgery [facility] [address] [place], [place] [address] [phone] Main / Paging   12/05/2014

## 2014-12-05 NOTE — Progress Notes (Signed)
INPATIENT PROGRESS NOTE  24 hour events: Patient returned to OR with Dr Johney Maine for laparoscopic LOA, mobilization of splenic flexure and revision of end colostomy in upper abdomen. The lower abdominal colostomy site was debrided and packed with wet to dry dressings. The JP was removed from the pelvis. Preoperatively she was transfused 2 units PRBC. Overnight the patient has had some pain in the abdomen making rest difficult. She denies nausea.  Assessment:  59 y.o. year old POD 98 s/p recurrent endometrioid endometrial/ovarian cancer s/p exploratory laparotomy, posterior supralevator pelvic exenteration, end colostomy, bladder repair, ureteral stenting on 11/09/14, POD 1 s/p laparoscopic revision of colostomy for peristomal abscess/fistula.  Plan: 1/ Peri-stomal infection/cellulitis: continueIV antibiotics. Wet to dry dressing to be removed tomorrow per Dr Johney Maine. Possible wound vac at that time.  2/ Chronic neutropenia (idiopathic): Dr Alvy Bimler is followup. Appreciate this. Granix prn pending WBC and clinical picture.  3/ Complex bladder repair with ureteral stents, foley placement. Continue foley catheter. Do not remove. Plan on retrograde cystogram in 1-2 weeks. Stent removal 6 weeks postop pending CT urogram results.  4/ Left lower extremity edema: Dopplers negative for DVT. Given appropriate postop Hb, will restart Lovenox prophylaxis.  5/ Acute blood loss anemia: s/p transfusion 2 units on 12/04/14. Appropriate risk in Hb. Continue to monitor.  6/ type II DM: hold home meds while NPO.  7/ hypomagnesemia, hypokalemia- adequately repleted today.  8/ Pain management: will write for PCA today. And ambien prn in evenings.   HPI: [name] is a 59 y.o. 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 She then underwent a posterior supralevator exenteration with colostomy  and bladder repair and stent placement on [id number] without complications. Her postoperative course was uncomplicate with the exception of development of postop anemia. Her final pathology revealed endometrioid adenocarcinoma invading the vagina and rectum with negative margins on the specimen. The margin had been close (clinically) to the right pelvic sidewall which was m.  Postoperatively she developed cellulitis lateral to the colostomy site. She was admitted to the hospital on 12/02/14 but showed minimal improvement with IV antibiotics. CT abdo/pelvis on 12/02/14 showed no abscess or apparent fistula. 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 The patient was taken back to the OR on 12/04/14 for laparoscopic colostomy revision and repositioning in the upper abdomen. The prior colostomy site was kept open at the subQ with wet to dry dressings.   Review of systems: Constitutional: She has no weight gain or weight loss. She has a low grade fever no chills. Eyes: No blurred vision Ears, Nose, Mouth, Throat: No dizziness, headaches or changes in hearing. No mouth sores. Cardiovascular: No chest pain, palpitations or edema. Respiratory: No shortness of breath, wheezing or cough Gastrointestinal: She has normal bowel movements trhough stoma without diarrhea or constipation. She denies any nausea or vomiting. She denies blood in her stool or heart burn. Genitourinary: She denies pelvic pain, pelvic pressure or changes in her urinary function. She has no hematuria, dysuria, or incontinence (foley in situ). She has no irregular vaginal bleeding or vaginal discharge Musculoskeletal: Denies muscle weakness or joint pains.  Skin: She has no skin changes, rashes or itching Neurological: Denies dizziness or headaches. No neuropathy, no numbness or tingling. Psychiatric: She denies depression or  anxiety. Hematologic/Lymphatic: No easy bruising or bleeding  Allergies  Allergen Reactions  .  Penicillins Swelling    Facial swelling  . Ultram [Tramadol] Hives  . Adhesive [Tape]     blisters  . Cefaclor Rash    Ceclor  . Erythromycin     gastritis  . Trimethoprim Rash  . Sulfa Antibiotics Rash    Current Facility-Administered Medications on File Prior to Encounter  Medication Dose Route Frequency Provider Last Rate Last Dose  . Tbo-Filgrastim (GRANIX) injection 480 mcg 480 mcg Subcutaneous Weekly Heath Lark, MD  480 mcg at 11/29/14 1338   Current Outpatient Prescriptions on File Prior to Encounter  Medication Sig Dispense Refill  . acetaminophen (TYLENOL) 500 MG tablet Take 2 tablets (1,000 mg total) by mouth 3 (three) times daily. 30 tablet 0  . Calcium Carbonate-Vitamin D (CALCIUM + D PO) Take 2 tablets by mouth daily.    . Canagliflozin (INVOKANA) 300 MG TABS Take 1 tablet by mouth every morning.     . Cholecalciferol (VITAMIN D3) 10000 UNITS capsule Take 10,000 Units by mouth once a week. Sundays    . docusate sodium (COLACE) 100 MG capsule Take 100 mg by mouth daily.    [name] Kitchen enoxaparin (LOVENOX) 40 MG/0.4ML injection Inject 0.4 mLs (40 mg total) into the skin daily. 21 Syringe 1  . ferrous sulfate 325 (65 FE) MG tablet Take 1 tablet (325 mg total) by mouth 2 (two) times daily with a meal. 60 tablet 3  . hydrochlorothiazide (HYDRODIURIL) 12.5 MG tablet Take 12.5 mg by mouth every morning.     [name] Kitchen ibuprofen (ADVIL,MOTRIN) 800 MG tablet     . levothyroxine (SYNTHROID) 175 MCG tablet Take 175 mcg by mouth daily before breakfast.    . metFORMIN (GLUCOPHAGE) 1000 MG tablet Take 1,000 mg by mouth 2 (two) times daily with a meal.     . Multiple Vitamin (MULTIVITAMIN WITH MINERALS) TABS tablet Take 1 tablet by mouth daily.    [name] Kitchen omega-3 acid ethyl esters  (LOVAZA) 1 G capsule Take 1 g by mouth 2 (two) times daily.    [name] Kitchen omeprazole (PRILOSEC) 20 MG capsule Take 20 mg by mouth daily.    . ondansetron (ZOFRAN) 4 MG tablet Take 1 tablet (4 mg total) by mouth every 6 (six) hours as needed for nausea. 20 tablet 0  . ONETOUCH VERIO test strip     . oxyCODONE (OXY IR/ROXICODONE) 5 MG immediate release tablet Take 1-2 tablets (5-10 mg total) by mouth every 4 (four) hours as needed for moderate pain, severe pain or breakthrough pain. 30 tablet 0  . Polyethyl Glycol-Propyl Glycol (SYSTANE OP) Apply 1 drop to eye daily as needed (Dry eyes).     [name] Kitchen PROVENTIL HFA 108 (90 BASE) MCG/ACT inhaler   0  . rosuvastatin (CRESTOR) 10 MG tablet Take 10 mg by mouth every evening.     . sitaGLIPtin (JANUVIA) 100 MG tablet Take 100 mg by mouth daily.    . Tbo-Filgrastim (GRANIX) 480 MCG/0.8ML SOSY injection Inject 480 mcg into the skin once a week.    . traMADol (ULTRAM) 50 MG tablet   0  . valsartan (DIOVAN) 320 MG tablet Take 320 mg by mouth every morning.      Past Medical History  Diagnosis Date  . Hypothyroidism 12/14/2011  . Benign essential HTN 12/14/2011  . Neutropenia   . Postmenopausal bleeding 09/05/2014  . Anemia     Planned Neupogen injections for chronic neutropenia  . Transfusion history     '06-s/p hysterectomy and staging  . Multiple thyroid nodules y-8  Managed by Dr. Harlow Asa  . GERD (gastroesophageal reflux disease)   . Polyp of duodenum y-2  . DM type 2 (diabetes mellitus, type 2) 12/14/2011  . Dysrhythmia     skipped beat with occasional coffee   . Hx of bronchitis   . Depression   . Hiatal hernia y-12    hx of small hiatal hernia   . Neutropenia     chronic   . Cancer     '06-Ovarian/ Uterine Cancer-chemotherapy  . 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 Appendectomy    . Tonsillectomy    . Cholecystectomy      laparoscopic  . Eye surgery      pytosis of eyelids-child  . Colonoscopy with propofol N/A 08/21/2013    Procedure: COLONOSCOPY WITH PROPOFOL; Surgeon: Cleotis Nipper, MD; Location: [facility]; Service: Endoscopy; Laterality: N/A;  . Abdominal hysterectomy      staging for Ovarian cancer  . Eus N/A 10/02/2014    Procedure: LOWER ENDOSCOPIC ULTRASOUND (EUS); Surgeon: Arta Silence, MD; Location: [facility]; Service: Endoscopy; Laterality: N/A;  . Robotic assisted lap vaginal hysterectomy N/A 11/19/2014    Procedure: ROBOTIC LYSIS OF ADHESIONS, CONVERTED TO LAPAROTOMY RADICAL UPPER VAGINECTOMY,LOW ANTERIOR BOWEL RESECTION, COLOSTOMY, BILATERAL URETERAL STENT PLACEMENT AND CYSTONOMY CLOSURE; Surgeon: Everitt Amber, MD; Location: [facility]; Service: Gynecology; Laterality: N/A;  . Ostomy N/A 11/19/2014    Procedure: OSTOMY; Surgeon: Michael [name], MD; Location: [facility]; Service: General; Laterality: N/A;    Family History  Problem Relation Age of Onset  . Cancer Mother     stomach ca  . Hypertension Mother   . Cancer Father     prostate ca  . Diabetes Father   . Diabetes Sister   . Hypertension Brother y-10    History   Social History  . Marital Status: Married    Spouse Name: N/A  . Number of Children: N/A  . Years of Education: N/A   Occupational History  . Not on file.   Social History Main Topics  . Smoking status: Never Smoker   . Smokeless tobacco: Never Used  . Alcohol Use: Yes     Comment: rare social  . Drug Use: No  . Sexual Activity: Not Currently   Other Topics Concern  . Not on file   Social History Narrative   CBC    Component Value Date/Time   WBC 3.8* 12/05/2014 0432   WBC 2.1* 11/29/2014 1251   WBC 2.4*  08/25/2014 1541   RBC 3.46* 12/05/2014 0432   RBC 2.66* 11/29/2014 1251   RBC 4.75 08/25/2014 1541   HGB 9.0* 12/05/2014 0432   HGB 7.2* 11/29/2014 1251   HGB 13.0 08/25/2014 1541   HCT 29.4* 12/05/2014 0432   HCT 23.4* 11/29/2014 1251   HCT 41.3 08/25/2014 1541   PLT 415* 12/05/2014 0432   PLT 548* 11/29/2014 1251   MCV 85.0 12/05/2014 0432   MCV 88.0 11/29/2014 1251   MCV 86.8 08/25/2014 1541   MCH 26.0 12/05/2014 0432   MCH 27.1 11/29/2014 1251   MCH 27.5 08/25/2014 1541   MCHC 30.6 12/05/2014 0432   MCHC 30.8* 11/29/2014 1251   MCHC 31.6* 08/25/2014 1541   RDW 17.0* 12/05/2014 0432   RDW 17.5* 11/29/2014 1251   LYMPHSABS 0.8 12/05/2014 0432   LYMPHSABS 0.6* 11/29/2014 1251   MONOABS 0.7 12/05/2014 0432   MONOABS 0.8 11/29/2014 1251   EOSABS 0.2 12/05/2014 0432  EOSABS 0.2 11/29/2014 1251   BASOSABS 0.0 12/05/2014 0432   BASOSABS 0.0 11/29/2014 1251    BMET    Component Value Date/Time   NA 137 12/05/2014 0432   NA 140 11/29/2014 1251   K 4.2 12/05/2014 0432   K 3.5 11/29/2014 1251   CL 103 12/05/2014 0432   CO2 28 12/05/2014 0432   CO2 30* 11/29/2014 1251   GLUCOSE 123* 12/05/2014 0432   GLUCOSE 168* 11/29/2014 1251   BUN 8 12/05/2014 0432   BUN 11.2 11/29/2014 1251   CREATININE 0.60 12/05/2014 0432   CREATININE 0.7 11/29/2014 1251   CREATININE 0.84 08/25/2014 1538   CALCIUM 8.3* 12/05/2014 0432   CALCIUM 9.3 11/29/2014 1251   GFRNONAA >60 12/05/2014 0432   GFRNONAA 76 08/25/2014 1538   GFRAA >60 12/05/2014 0432   GFRAA 87 08/25/2014 1538      Physical Exam: Blood pressure 136/60, pulse 94, temperature 98.2 F (36.8 C), temperature source Oral, resp. rate 18, height 5\' 2"  (1.575 m), weight 252 lb 12.8 oz (114.669 kg). General: Well dressed, well nourished in no apparent distress.  HEENT: Normocephalic and atraumatic, no lesions. Extraocular muscles intact. Sclerae anicteric. Pupils equal, round, reactive. No mouth sores or ulcers. Thyroid is  normal size, not nodular, midline. Skin: No lesions or rashes. Breasts: deferred Lungs: Clear to auscultation bilaterally. No wheezes. Cardiovascular: Regular rate and rhythm. No murmurs or rubs. Abdomen: Soft, nontender, nondistended. No palpable masses. No hepatosplenomegaly. No ascites. Normal bowel sounds. No hernias. Midline incision is healing with no drainage or erythema. No drainage. Laparoscopic incisions are clean and dry. To the left of the prior colostomy (lateral border) the blanching erythema and underlying firmness of the subQ tissue, consistent with cellulitis is regressing from yesterday. Wet to dry dressing in place. Stable size distribution from previously marked edge of erythema. No underlying fluctuance. New stoma appears pink and viable. Bag sweat only, no flatus or stool. Genitourinary: deferred Extremities: No cyanosis, clubbing. LLE edema (L>R). No calf tenderness or erythema. No palpable cords. Psychiatric: Mood and affect are appropriate. Neurological: Awake, alert and oriented x 3. Sensation is intact, no neuropathy.  Musculoskeletal: No pain, normal strength and range of motion.  Donaciano Eva, MD      Routing History

## 2014-12-06 ENCOUNTER — Other Ambulatory Visit: Payer: BLUE CROSS/BLUE SHIELD

## 2014-12-06 ENCOUNTER — Ambulatory Visit: Payer: BLUE CROSS/BLUE SHIELD

## 2014-12-06 LAB — GLUCOSE, CAPILLARY
GLUCOSE-CAPILLARY: 136 mg/dL — AB (ref 70–99)
GLUCOSE-CAPILLARY: 144 mg/dL — AB (ref 70–99)
Glucose-Capillary: 143 mg/dL — ABNORMAL HIGH (ref 70–99)
Glucose-Capillary: 127 mg/dL — ABNORMAL HIGH (ref 70–99)
Glucose-Capillary: 118 mg/dL — ABNORMAL HIGH (ref 70–99)
Glucose-Capillary: 164 mg/dL — ABNORMAL HIGH (ref 70–99)
Glucose-Capillary: 108 mg/dL — ABNORMAL HIGH (ref 70–99)

## 2014-12-06 LAB — CBC WITH DIFFERENTIAL/PLATELET
BASOS ABS: 0 10*3/uL (ref 0.0–0.1)
Basophils Relative: 1 % (ref 0–1)
EOS ABS: 0.3 10*3/uL (ref 0.0–0.7)
Eosinophils Relative: 10 % — ABNORMAL HIGH (ref 0–5)
HCT: 28.8 % — ABNORMAL LOW (ref 36.0–46.0)
Hemoglobin: 9.2 g/dL — ABNORMAL LOW (ref 12.0–15.0)
LYMPHS ABS: 0.8 10*3/uL (ref 0.7–4.0)
Lymphocytes Relative: 28 % (ref 12–46)
MCH: 27.3 pg (ref 26.0–34.0)
MCHC: 31.9 g/dL (ref 30.0–36.0)
MCV: 85.5 fL (ref 78.0–100.0)
Monocytes Absolute: 0.9 10*3/uL (ref 0.1–1.0)
Monocytes Relative: 29 % — ABNORMAL HIGH (ref 3–12)
Neutro Abs: 1 10*3/uL — ABNORMAL LOW (ref 1.7–7.7)
Neutrophils Relative %: 32 % — ABNORMAL LOW (ref 43–77)
PLATELETS: 411 10*3/uL — AB (ref 150–400)
RBC: 3.37 MIL/uL — ABNORMAL LOW (ref 3.87–5.11)
RDW: 17.2 % — AB (ref 11.5–15.5)
WBC: 3 10*3/uL — AB (ref 4.0–10.5)

## 2014-12-06 LAB — BASIC METABOLIC PANEL
Anion gap: 8 (ref 5–15)
BUN: 7 mg/dL (ref 6–20)
CALCIUM: 8.4 mg/dL — AB (ref 8.9–10.3)
CO2: 27 mmol/L (ref 22–32)
Chloride: 101 mmol/L (ref 101–111)
Creatinine, Ser: 0.67 mg/dL (ref 0.44–1.00)
GFR calc non Af Amer: >60 mL/min (ref >60)
Glucose, Bld: 133 mg/dL — ABNORMAL HIGH (ref 70–99)
Potassium: 4.4 mmol/L (ref 3.5–5.1)
SODIUM: 136 mmol/L (ref 135–145)

## 2014-12-06 MED ORDER — INSULIN ASPART 100 UNIT/ML ~~LOC~~ SOLN
0.0000 [IU] | Freq: Three times a day (TID) | SUBCUTANEOUS | Status: DC
  Administered 2014-12-06: 4 [IU] via SUBCUTANEOUS
  Administered 2014-12-06 – 2014-12-07 (×4): 3 [IU] via SUBCUTANEOUS
  Administered 2014-12-08: 4 [IU] via SUBCUTANEOUS
  Administered 2014-12-08 (×2): 3 [IU] via SUBCUTANEOUS
  Administered 2014-12-09 (×2): 4 [IU] via SUBCUTANEOUS
  Administered 2014-12-10 (×2): 3 [IU] via SUBCUTANEOUS
  Administered 2014-12-11: 4 [IU] via SUBCUTANEOUS
  Administered 2014-12-12 (×2): 3 [IU] via SUBCUTANEOUS

## 2014-12-06 MED ORDER — POLYVINYL ALCOHOL 1.4 % OP SOLN: 1.0000 [drp] | Freq: Every day | OPHTHALMIC | Status: DC | PRN

## 2014-12-06 MED ORDER — ACETAMINOPHEN 500 MG PO TABS
1000.0000 mg | ORAL_TABLET | Freq: Four times a day (QID) | ORAL | Status: DC
  Administered 2014-12-06 – 2014-12-12 (×17): 1000 mg via ORAL
  Filled 2014-12-06 (×26): qty 2

## 2014-12-06 MED ORDER — POLYETHYL GLYCOL-PROPYL GLYCOL 0.4-0.3 % OP SOLN: 1.0000 [drp] | Freq: Every day | OPHTHALMIC | Status: DC | PRN

## 2014-12-06 MED ORDER — POLYETHYLENE GLYCOL 3350 17 G PO PACK
17.0000 g | PACK | Freq: Every day | ORAL | Status: DC
  Administered 2014-12-06 – 2014-12-07 (×2): 17 g via ORAL
  Filled 2014-12-06 (×4): qty 1

## 2014-12-06 NOTE — Progress Notes (Signed)
Chaplain visited patient on 11/22/2014. Patient was discharged and readmitted. Patient had surgery and is recovering and has a positive outlook on healing. She has her husband at her bedside. Chaplain provided emotional support and prayer.   12/06/14 1100  Clinical Encounter Type  Visited With Patient and family together  Visit Type Initial;Follow-up;Spiritual support;Social support  Referral From Nurse  Spiritual Encounters  Spiritual Needs Prayer;Emotional

## 2014-12-06 NOTE — Progress Notes (Signed)
[facility]  [address] [place], [place] [address] Phone: [phone] FAX: [phone]    Presli Fanguy [id number] 12/29/1959  CARE TEAM:  PCP: Leana Gamer., MD  Outpatient Care Team: Patient Care Team: Leana Gamer, MD as PCP - General (Internal Medicine) [name], MD as Referring Physician (Obstetrics and Gynecology) Heath Lark, MD as Consulting Physician (Hematology and Oncology) Everitt Amber, MD as Consulting Physician (Obstetrics and Gynecology) Michael [name], MD as Consulting Physician (General Surgery)  Inpatient Treatment Team: Treatment Team: Attending Provider: Everitt Amber, MD; Consulting Physician: Michael [name], MD; Technician: Suzanna Obey, NT; Respiratory Therapist: Lita Mains, RRT; Physician Assistant: Rondel Jumbo, PA-C; Consulting Physician: Heath Lark, MD; Registered Nurse: Nadara Mode, RN; Technician: Lennie Odor, NT; Nurse Practitioner: Dorothyann Gibbs, NP; Registered Nurse: Curlene Labrum, RN; Technician: Ileene Rubens, NT; Registered Nurse: Lupita Shutter., RN; Technician: Tennis Ship, NT  Problem List:   Principal Problem:   Cellulitis of left abdominal wall near colostomy Active Problems:   Chronic neutropenia   2 Days Post-Op    12/04/2014 POST-OPERATIVE DIAGNOSIS:   Abdominal wall abscess secondary to colostomy perforation  PROCEDURE: Procedure(s): LAPROSCOPIC LYSIS OF ADHESIONS Laparoscopic mobilization of the splenic flexure RELOCATION OF COLOSTOMY DEBRIDEMENT INITIAL COLOSTOMY SITE  SURGEON: Surgeon(s): Michael [name], MD     PRIOR Procedure(s) 11/19/2014 POST-OPERATIVE DIAGNOSIS: 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 TWO LYMPH NODES, NEGATIVE FOR ATYPIA OR MALIGNANCY (0/2). RESECTION MARGINS, NEGATIVE FOR ATYPIA OR MALIGNANCY.  Microscopic Comment Please see [name] description for details. [name] Pathologist, Electronic Signature (Case signed 11/21/2014) Specimen Yolonda Purtle and Clinical Information Specimen(s) Obtained: Colon, segmental resection for tumor, rectosigmoid colon with upper vagina Specimen Clinical Information endometrial cancer (kp) Lorik Guo The specimen is received in formalin labeled rectosigmoid colon and upper vagina, and consists of an 18.1 cm in length portion of colon, with the proximal margin stapled and the distal margin open. On the anterior aspect of the colon, there is a 2.7 x 2.6 cm slightly disrupted circular portion of gray-brown mucosa designated by a stitch, which is clinically stated as posterior vaginal wall. The resection margin surrounding the vaginal tissue is inked black, and the distal resection margin is inked black for orientation. The soft tissue surrounding the vaginal tissue along the right side is slightly firm. The lumen of the colon contains a small amount of green-brown fecal material, and the mucosa is tan-pink with normal folding. No mucosal lesions are grossly identified, however, palpating the colon, which overlies the indurated adipose tissue reveals a firm nodule within the possible submucosa and muscularis. The nodule measures approximately 0.6 cm, and is 2.9 cm from the distal resection margin. Sectioning through the distal aspect of the colon and underlying soft tissue reveals a 4.6 x 4.5 x 4.0 cm tan-red, hemorrhagic,  softened lesion within the 1 of 2 FINAL for Lazarz, Sharlon ([id number]) Lisa-Marie Rueger(continued) underlying adipose tissue. The lesion invades into the overlying muscularis propria at the area previously noted. The colon wall measures 0.5 cm in thickness. Three tan-pink possible lymph  nodes are identified, ranging from 0.2 cm to 0.5 cm in greatest dimension. Please note the patient is status post chemotherapy. Representative sections are submitted in twelve cassettes. A = proximal resection margin. B, C = distal resection margin. D - F = lesion to overlying colon. G, H = lesion. I, J = vaginal mucosa. K = uninvolved mucosa. L = three possible lymph nodes. (KL:ecj 11/20/2014) Report signed out from the following location(s) Technical component and interpretation was performed at [facility]  Cellulitis with abscess lateral to colostomy with stool due to absominal wall perforation - improved  Plan:  IV ABx.  Vanco/Clindamycin with ### ABx allergies.    Dressing changes to old colostomy site.  Switch to wound vac MWF starting today if cellulitis reduced more tomorrow since no major purulence/necrosis. D/w RN   Anemia.  Transfused yesterday - follow - transfuse if Hgb <7 and symptomatic if GynOnc Dr Rossi/LJM agree.  ?Reinvolve Hematology/oncology - CSF to help stim BMarrow?  Pt asymptomatic.    Low K  - replacing & better  Pain control -  Continue tylenol RTC.  PO oxycodone w IV Dilaudid backup.  PCA per Dr Ubaldo Glassing - DO NOT REMOVE due to bladder repair & ureteral stenting.  She had complex cystotomy repair (trigone and base) and ureteral stenting. Will perform retrograde cystogram in 12May will d.c foley at that time IF no leak noted. Remove ureteral stents at 6 weeks if no stricture/leak on CT urogram.  DRAIN Removed 12/04/2014.  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 TWO LYMPH NODES, NEGATIVE FOR ATYPIA OR MALIGNANCY (0/2). I am skeptical that only 2 LN were found in 18cm of intact mesorectum & mesocolon although not a colon adenoCA = may not be usually drainage point for the recurrent endometriod CA  RESECTION MARGINS, NEGATIVE FOR ATYPIA OR MALIGNANCY.  Glc control - ? SSI.  Restarting PO hypoglycemics  HTN - OK off meds - restart gradually.  GERD - protonix daily  -VTE prophylaxis- SCDs, etc  -mobilize as tolerated to help recovery    I updated the patient's status to the patient.   Recommendations were made.  Questions were answered.  The patient expressed understanding & appreciation.   [name], M.D., F.A.C.S. Gastrointestinal and Minimally Invasive Surgery [facility] [address], [place], [place] [address] [phone] Main / Paging   12/06/2014  Subjective:  3W onc floor Sore Slept better Pain controlled better w PCA No n/v.  Tol fulls Mild abdominal cramping  Objective:  Vital signs:  Filed Vitals:   12/05/14 2040 12/06/14 0013 12/06/14 0421 12/06/14 0426  BP:   115/54   Pulse:   85   Temp:   98.3 F (36.8 C)   TempSrc:   Oral   Resp: 22 18 17 18   Height:      Weight:      SpO2: 96% 99% 96% 97%    Last BM Date: 12/04/14  Intake/Output   Yesterday:  05/05 0701 - 05/06 0700 In: 1090.3 [P.O.:840; I.V.:250.3] Out: 3720 [Urine:3625; Stool:95] This shift:  Total I/O In: 730.3 [P.O.:480; I.V.:250.3] Out: 2095 [Urine:2000; Stool:95]  Bowel function:  Flatus: scant  BM: scant serous  Drain: n/a  Physical Exam:  General: Pt awake/alert/oriented in no acute distress.  Tired Eyes: PERRL, normal EOM.  Sclera clear.  No  icterus Neuro: CN II-XII intact w/o focal sensory/motor deficits. Lymph: No head/neck/groin lymphadenopathy Psych:  No delerium/psychosis/paranoia HENT: Normocephalic, Mucus membranes moist.  No thrush.   Neck: Supple,  No tracheal deviation Chest: No chest wall pain w good excursion CV:  Pulses intact.  Regular rhythm MS: Normal AROM mjr joints.  No obvious deformity Abdomen: Soft.  Nondistended.  Incision closed.   LLQ old colostomy site with minimal induration & erythema.  Improved No evidence of peritonitis.  No incarcerated hernias.  New LUQ colostomy pink w edema Ext:  SCDs BLE.  No mjr edema.  No cyanosis Skin: No petechiae / purpura  Results:   Labs: Results for orders placed or performed during the hospital encounter of 12/02/14 (from the past 48 hour(s))  Glucose, capillary     Status: Abnormal   Collection Time: 12/04/14  8:45 AM  Result Value Ref Range   Glucose-Capillary 123 (H) 70 - 99 mg/dL   Comment 1 Notify RN    Comment 2 Document in Chart   Type and screen     Status: None (Preliminary result)   Collection Time: 12/04/14  8:50 AM  Result Value Ref Range   ABO/RH(D) A POS    Antibody Screen NEG    Sample Expiration 12/07/2014    Unit Number [id number]    Blood Component Type RED CELLS,LR    Unit division 00    Status of Unit ISSUED,FINAL    Transfusion Status OK TO TRANSFUSE    Crossmatch Result Compatible    Unit Number [id number]    Blood Component Type RED CELLS,LR    Unit division 00    Status of Unit ISSUED,FINAL    Transfusion Status OK TO TRANSFUSE    Crossmatch Result Compatible    Unit Number [id number]    Blood Component Type RED CELLS,LR    Unit division 00    Status of Unit ALLOCATED    Transfusion Status OK TO TRANSFUSE    Crossmatch Result Compatible    Unit Number [id number]    Blood Component Type RED CELLS,LR    Unit division 00    Status of Unit ISSUED,FINAL    Transfusion Status OK TO TRANSFUSE    Crossmatch Result Compatible   Basic metabolic panel     Status: Abnormal   Collection Time: 12/04/14  8:50 AM  Result Value Ref Range   Sodium 135 135 - 145 mmol/L   Potassium 4.0 3.5 - 5.1 mmol/L    Comment: DELTA CHECK NOTED REPEATED  TO VERIFY NO VISIBLE HEMOLYSIS    Chloride 103 101 - 111 mmol/L   CO2 26 22 - 32 mmol/L   Glucose, Bld 145 (H) 70 - 99 mg/dL   BUN 11 6 - 20 mg/dL   Creatinine, Ser 0.72 0.44 - 1.00 mg/dL   Calcium 8.4 (L) 8.9 - 10.3 mg/dL   GFR calc non Af Amer >60 >60 mL/min   GFR calc Af Amer >60 >60 mL/min    Comment: (NOTE) The eGFR has been calculated using the CKD EPI equation. This calculation has not been validated in all clinical situations. eGFR's persistently <90 mL/min signify possible Chronic Kidney Disease.    Anion gap 6 5 - 15  Surgical pcr screen     Status: None   Collection Time: 12/04/14 10:09 AM  Result Value Ref Range   MRSA, PCR NEGATIVE NEGATIVE   Staphylococcus aureus NEGATIVE NEGATIVE    Comment:        The Xpert SA Assay (  FDA approved for NASAL specimens in patients over 29 years of age), is one component of a comprehensive surveillance program.  Test performance has been validated by [facility] for patients greater than or equal to 71 year old. It is not intended to diagnose infection nor to guide or monitor treatment.   Glucose, capillary     Status: Abnormal   Collection Time: 12/04/14 12:13 PM  Result Value Ref Range   Glucose-Capillary 106 (H) 70 - 99 mg/dL   Comment 1 Notify RN    Comment 2 Document in Chart   I-STAT 4, (NA,K, GLUC, HGB,HCT)     Status: Abnormal   Collection Time: 12/04/14  1:42 PM  Result Value Ref Range   Sodium 138 135 - 145 mmol/L   Potassium 4.5 3.5 - 5.1 mmol/L   Glucose, Bld 102 (H) 70 - 99 mg/dL   HCT 26.0 (L) 36.0 - 46.0 %   Hemoglobin 8.8 (L) 12.0 - 15.0 g/dL  I-STAT 4, (NA,K, GLUC, HGB,HCT)     Status: Abnormal   Collection Time: 12/04/14  4:00 PM  Result Value Ref Range   Sodium 136 135 - 145 mmol/L   Potassium 4.4 3.5 - 5.1 mmol/L   Glucose, Bld 138 (H) 70 - 99 mg/dL   HCT 28.0 (L) 36.0 - 46.0 %   Hemoglobin 9.5 (L) 12.0 - 15.0 g/dL  Glucose, capillary     Status: Abnormal   Collection Time: 12/04/14  5:45 PM   Result Value Ref Range   Glucose-Capillary 144 (H) 70 - 99 mg/dL   Comment 1 Notify RN    Comment 2 Document in Chart   Glucose, capillary     Status: Abnormal   Collection Time: 12/04/14  8:10 PM  Result Value Ref Range   Glucose-Capillary 106 (H) 70 - 99 mg/dL   Comment 1 Notify RN   Glucose, capillary     Status: None   Collection Time: 12/05/14 12:08 AM  Result Value Ref Range   Glucose-Capillary 99 70 - 99 mg/dL   Comment 1 Notify RN   CBC WITH DIFFERENTIAL     Status: Abnormal   Collection Time: 12/05/14  4:32 AM  Result Value Ref Range   WBC 3.8 (L) 4.0 - 10.5 K/uL   RBC 3.46 (L) 3.87 - 5.11 MIL/uL   Hemoglobin 9.0 (L) 12.0 - 15.0 g/dL   HCT 29.4 (L) 36.0 - 46.0 %   MCV 85.0 78.0 - 100.0 fL   MCH 26.0 26.0 - 34.0 pg   MCHC 30.6 30.0 - 36.0 g/dL   RDW 17.0 (H) 11.5 - 15.5 %   Platelets 415 (H) 150 - 400 K/uL   Neutrophils Relative % 54 43 - 77 %   Lymphocytes Relative 21 12 - 46 %   Monocytes Relative 19 (H) 3 - 12 %   Eosinophils Relative 5 0 - 5 %   Basophils Relative 1 0 - 1 %   nRBC 3 (H) 0 /100 WBC   Neutro Abs 2.1 1.7 - 7.7 K/uL   Lymphs Abs 0.8 0.7 - 4.0 K/uL   Monocytes Absolute 0.7 0.1 - 1.0 K/uL   Eosinophils Absolute 0.2 0.0 - 0.7 K/uL   Basophils Absolute 0.0 0.0 - 0.1 K/uL   RBC Morphology POLYCHROMASIA PRESENT     Comment: RARE NRBCs   WBC Morphology MILD LEFT SHIFT (1-5% METAS, OCC MYELO, OCC BANDS)   Basic metabolic panel     Status: Abnormal   Collection Time: 12/05/14  4:32 AM  Result Value Ref Range   Sodium 137 135 - 145 mmol/L   Potassium 4.2 3.5 - 5.1 mmol/L   Chloride 103 101 - 111 mmol/L   CO2 28 22 - 32 mmol/L   Glucose, Bld 123 (H) 70 - 99 mg/dL   BUN 8 6 - 20 mg/dL   Creatinine, Ser 0.60 0.44 - 1.00 mg/dL   Calcium 8.3 (L) 8.9 - 10.3 mg/dL   GFR calc non Af Amer >60 >60 mL/min   GFR calc Af Amer >60 >60 mL/min    Comment: (NOTE) The eGFR has been calculated using the CKD EPI equation. This calculation has not been validated in  all clinical situations. eGFR's persistently <90 mL/min signify possible Chronic Kidney Disease.    Anion gap 6 5 - 15  Vancomycin, trough     Status: None   Collection Time: 12/05/14  4:32 AM  Result Value Ref Range   Vancomycin Tr 17 10.0 - 20.0 ug/mL    Comment: Performed at [facility]  Glucose, capillary     Status: Abnormal   Collection Time: 12/05/14  4:50 AM  Result Value Ref Range   Glucose-Capillary 110 (H) 70 - 99 mg/dL   Comment 1 Notify RN   Glucose, capillary     Status: Abnormal   Collection Time: 12/05/14  7:52 AM  Result Value Ref Range   Glucose-Capillary 139 (H) 70 - 99 mg/dL   Comment 1 Notify RN    Comment 2 Document in Chart   Glucose, capillary     Status: Abnormal   Collection Time: 12/05/14 11:41 AM  Result Value Ref Range   Glucose-Capillary 132 (H) 70 - 99 mg/dL  Glucose, capillary     Status: Abnormal   Collection Time: 12/05/14  5:16 PM  Result Value Ref Range   Glucose-Capillary 114 (H) 70 - 99 mg/dL   Comment 1 Notify RN    Comment 2 Document in Chart   CBC WITH DIFFERENTIAL     Status: Abnormal   Collection Time: 12/06/14  4:20 AM  Result Value Ref Range   WBC 3.0 (L) 4.0 - 10.5 K/uL   RBC 3.37 (L) 3.87 - 5.11 MIL/uL   Hemoglobin 9.2 (L) 12.0 - 15.0 g/dL   HCT 28.8 (L) 36.0 - 46.0 %   MCV 85.5 78.0 - 100.0 fL   MCH 27.3 26.0 - 34.0 pg   MCHC 31.9 30.0 - 36.0 g/dL   RDW 17.2 (H) 11.5 - 15.5 %   Platelets 411 (H) 150 - 400 K/uL   Neutrophils Relative % 32 (L) 43 - 77 %   Lymphocytes Relative 28 12 - 46 %   Monocytes Relative 29 (H) 3 - 12 %   Eosinophils Relative 10 (H) 0 - 5 %   Basophils Relative 1 0 - 1 %   Neutro Abs 1.0 (L) 1.7 - 7.7 K/uL   Lymphs Abs 0.8 0.7 - 4.0 K/uL   Monocytes Absolute 0.9 0.1 - 1.0 K/uL   Eosinophils Absolute 0.3 0.0 - 0.7 K/uL   Basophils Absolute 0.0 0.0 - 0.1 K/uL   RBC Morphology POLYCHROMASIA PRESENT   Basic metabolic panel     Status: Abnormal   Collection Time: 12/06/14  4:20 AM  Result  Value Ref Range   Sodium 136 135 - 145 mmol/L   Potassium 4.4 3.5 - 5.1 mmol/L   Chloride 101 101 - 111 mmol/L   CO2 27 22 - 32 mmol/L   Glucose,  Bld 133 (H) 70 - 99 mg/dL   BUN 7 6 - 20 mg/dL   Creatinine, Ser 0.67 0.44 - 1.00 mg/dL   Calcium 8.4 (L) 8.9 - 10.3 mg/dL   GFR calc non Af Amer >60 >60 mL/min   GFR calc Af Amer >60 >60 mL/min    Comment: (NOTE) The eGFR has been calculated using the CKD EPI equation. This calculation has not been validated in all clinical situations. eGFR's persistently <60 mL/min signify possible Chronic Kidney Disease.    Anion gap 8 5 - 15    Imaging / Studies: No results found.  Medications / Allergies: per chart  Antibiotics: Anti-infectives    Start     Dose/Rate Route Frequency Ordered Stop   12/05/14 0600  gentamicin (GARAMYCIN) IVPB 100 mg  Status:  Discontinued    Comments:  Pharmacy may adjust dosing strength, schedule, rate of infusion, etc as needed to optimize therapy Send with patient on call to the OR.  Anesthesia to complete antibiotic administration <61mn [facility]   100 mg 200 mL/hr over 30 Minutes Intravenous On call to O.R. 12/04/14 1346 12/04/14 1352   12/04/14 1400  gentamicin (GARAMYCIN) 380 mg in dextrose 5 % 100 mL IVPB     5 mg/kg  75.4 kg (Adjusted) 109.5 mL/hr over 60 Minutes Intravenous  Once 12/04/14 1349 12/04/14 1430   12/03/14 0600  vancomycin (VANCOCIN) IVPB 1000 mg/200 mL premix     1,000 mg 200 mL/hr over 60 Minutes Intravenous Every 12 hours 12/02/14 1817     12/02/14 1930  clindamycin (CLEOCIN) IVPB 600 mg     600 mg 100 mL/hr over 30 Minutes Intravenous 4 times per day 12/02/14 1847     12/02/14 1830  metroNIDAZOLE (FLAGYL) IVPB 500 mg  Status:  Discontinued     500 mg 100 mL/hr over 60 Minutes Intravenous Every 8 hours 12/02/14 1816 12/02/14 1847   12/02/14 1815  vancomycin (VANCOCIN) 2,000 mg in sodium chloride 0.9 % 500 mL IVPB     2,000 mg 250 mL/hr over 120 Minutes  Intravenous  Once 12/02/14 1814 12/02/14 2249   12/02/14 1815  ciprofloxacin (CIPRO) IVPB 400 mg  Status:  Discontinued     400 mg 200 mL/hr over 60 Minutes Intravenous Every 12 hours 12/02/14 1816 12/02/14 1847   12/02/14 1800  metroNIDAZOLE (FLAGYL) IVPB 500 mg  Status:  Discontinued     500 mg 100 mL/hr over 60 Minutes Intravenous Every 8 hours 12/02/14 1649 12/02/14 1733   12/02/14 1700  ciprofloxacin (CIPRO) IVPB 200 mg  Status:  Discontinued     200 mg 100 mL/hr over 60 Minutes Intravenous Every 12 hours 12/02/14 1646 12/02/14 1733       Note: Portions of this report may have been transcribed using voice recognition software. Every effort was made to ensure accuracy; however, inadvertent computerized transcription errors may be present.   Any transcriptional errors that result from this process are unintentional.     SAdin Hector M.D., F.A.C.S. Gastrointestinal and Minimally Invasive Surgery [facility] [address] [place] [phone] / Paging   12/06/2014

## 2014-12-06 NOTE — Progress Notes (Signed)
[place]  [phone]   I have seen the patient, examined her and edited the notes as follows  HOSPITAL PROGRESS  NOTE  Subjective:  Feeling better this morning. Denies fevers, chills, night sweats, vision changes, or mucositis. Denies any respiratory complaints. Denies any chest pain or palpitations. She is somewhat fatigued. Denies lower extremity swelling. Denies nausea, heartburn. She denies loose stools on her colostomy site. Incisional pain controlled with current regimen. Appetite is normal. Denies any dysuria. Denies abnormal neuropathy. Denies any bleeding issues such as epistaxis, hematemesis, hematuria or hematochezia. Ambulating without difficulty.    MEDICATIONS: Scheduled Meds: . acetaminophen  1,000 mg Oral 4 times per day  . clindamycin (CLEOCIN) IV  600 mg Intravenous 4 times per day  . enoxaparin (LOVENOX) injection  40 mg Subcutaneous Q24H  . ferrous sulfate  325 mg Oral BID WC  . hydrochlorothiazide  12.5 mg Oral q morning - 10a  . HYDROmorphone PCA 0.3 mg/mL   Intravenous 6 times per day  . insulin aspart  0-20 Units Subcutaneous TID WC  . irbesartan  300 mg Oral Daily  . levothyroxine  175 mcg Oral QAC breakfast  . lip balm  1 application Topical BID  . omega-3 acid ethyl esters  1 g Oral BID  . ondansetron (ZOFRAN) IV  8 mg Intravenous 3 times per day  . pantoprazole  40 mg Oral Daily  . polyethylene glycol  17 g Oral Daily  . prenatal multivitamin  1 tablet Oral Q1200  . psyllium  1 packet Oral BID  . rosuvastatin  10 mg Oral QPM  . saccharomyces boulardii  250 mg Oral BID  . Tbo-filgastrim (GRANIX) SQ  480 mcg Subcutaneous q1800  . vancomycin  1,000 mg Intravenous Q12H   Continuous Infusions: . dextrose 5 % and 0.45% NaCl 10 mL/hr at 12/05/14 0824   PRN Meds:.alum & mag hydroxide-simeth, HYDROmorphone (DILAUDID) injection, lactated ringers, magic mouthwash, menthol-cetylpyridinium, naloxone **AND** sodium chloride,  ondansetron (ZOFRAN) IV, ondansetron, oxyCODONE, phenol, polyethylene glycol, polyvinyl alcohol, zolpidem ALLERGIES:   Allergies  Allergen Reactions  . Penicillins Swelling    Facial swelling  . Ultram [Tramadol] Hives  . Adhesive [Tape]     blisters  . Cefaclor Rash    Ceclor  . Erythromycin     Gastritis, abd cramps  . Trimethoprim Rash  . Sulfa Antibiotics Rash     PHYSICAL EXAMINATION:  Filed Vitals:   12/06/14 0426  BP:   Pulse:   Temp:   Resp: 18   Filed Weights   12/02/14 1517  Weight: 250 lb (113.399 kg)    GENERAL:alert, no distress and comfortable SKIN: She has cellulitis on her abdominal wall, worse than her previous examination. EYES: normal, conjunctiva are pink and non-injected, sclera clear OROPHARYNX:no exudate, no erythema and lips, buccal mucosa, and tongue normal  NECK: supple, thyroid normal size, non-tender, without nodularity LYMPH:  no palpable lymphadenopathy in the cervical, axillary or inguinal LUNGS: clear to auscultation and percussion with normal breathing effort HEART: regular rate & rhythm and no murmurs and trace left lower extremity edema ABDOMEN: abdomen soft, She has incisional tenderness,with minimal erythema, incision closed. Musculoskeletal:no cyanosis of digits and no clubbing  PSYCH: alert & oriented x 3 with fluent speech NEURO: no focal motor/sensory deficits   LABORATORY/RADIOLOGY DATA:   Recent Labs Lab 11/29/14 1251  12/02/14 1731 12/03/14 0408 12/04/14 0404 12/04/14 1342 12/04/14 1600 12/05/14 0432 12/06/14 0420  WBC 2.1*  --  4.9 5.5 4.5  --   --  3.8* 3.0*  HGB 7.2*  < > 6.6* 6.0* 6.5* 8.8* 9.5* 9.0* 9.2*  HCT 23.4*  < > 22.7* 20.5* 21.8* 26.0* 28.0* 29.4* 28.8*  PLT 548*  --  537* 442* 523*  --   --  415* 411*  MCV 88.0  --  87.3 87.6 87.2  --   --  85.0 85.5  MCH 27.1  --  25.4* 25.6* 26.0  --   --  26.0 27.3  MCHC 30.8*  --  29.1* 29.3* 29.8*  --   --  30.6 31.9  RDW 17.5*  --  17.4* 17.3* 17.6*  --    --  17.0* 17.2*  LYMPHSABS 0.6*  --  1.5  --  1.6  --   --  0.8 0.8  MONOABS 0.8  --  1.2*  --  1.0  --   --  0.7 0.9  EOSABS 0.2  --  0.3  --  0.3  --   --  0.2 0.3  BASOSABS 0.0  --  0.1  --  0.0  --   --  0.0 0.0  < > = values in this interval not displayed.  CMP    Recent Labs Lab 11/29/14 1251  12/02/14 1731 12/03/14 0408 12/04/14 0850 12/04/14 1342 12/04/14 1600 12/05/14 0432 12/06/14 0420  NA 140  < > 138 137 135 138 136 137 136  K 3.5  < > 2.8* 3.2* 4.0 4.5 4.4 4.2 4.4  CL  --   --  103 100* 103  --   --  103 101  CO2 30*  --  29 28 26   --   --  28 27  GLUCOSE 168*  < > 130* 124* 145* 102* 138* 123* 133*  BUN 11.2  --  11 11 11   --   --  8 7  CREATININE 0.7  --  0.67 0.80 0.72  --   --  0.60 0.67  CALCIUM 9.3  --  9.1 8.4* 8.4*  --   --  8.3* 8.4*  MG  --   --  1.3* 1.3*  --   --   --   --   --   AST 15  --   --   --   --   --   --   --   --   ALT 17  --   --   --   --   --   --   --   --   ALKPHOS 77  --   --   --   --   --   --   --   --   BILITOT 0.34  --   --   --   --   --   --   --   --   < > = values in this interval not displayed.      Component Value Date/Time   BILITOT 0.34 11/29/2014 1251   BILITOT 0.6 11/07/2014 1100     Liver Function Tests:  Recent Labs Lab 11/29/14 1251  AST 15  ALT 17  ALKPHOS 77  BILITOT 0.34  PROT 6.4  ALBUMIN 2.6*    CBG:  Recent Labs Lab 12/05/14 0008 12/05/14 0450 12/05/14 0752 12/05/14 1141 12/05/14 1716  GLUCAP 99 110* 139* 132* 114*    Radiology Studies: None  ASSESSMENT AND PLAN:  Chronic neutropenia The patient responded well to G-CSF. There is no role for IVIG. She will get close CBC  monitoring daily due to recent infection above. She will receive a dose of Granix today and whenever her ANC is less than 1500.   History of ovarian cancer She is recovering well from recent surgical resection. Will defer to her gynecologist oncologist to determine the best course of adjuvant treatment for  her.  Incisional infection of surgical site She has skin infection around her surgical site, requiring admission. CT of the abdomen and pelvis on 12/03/2014 was remarkable for cellulitis in the region, requiring IV antibiotics. She is on at appropriate antibiotics. She also has received G-CSF injections, last on 4/29, and she will continue on injection as needed to prevent life-threatening infection.  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 She received 2 units on 5/4 for a Hb 6.0 She is responding well with great reticulocytosis with oral iron supplement. Recommend she continues to take Iron supplement for the next few months  If she cannot tolerate that further, can also prescribe intravenous iron infusion.  Reactive thrombocytosis, improved She has signs of reactive thrombocytosis to recent postsurgical blood loss, mild iron deficiency and recent mild infection around the stoma. Recommend observation only and to continue on oral iron supplement  DVT prophylaxis On Lovenox, restarted on 5/5  Left lower extremity edema, improved Left lower extremity  ultrasound, is negative for DVT.  Continue to monitor.  Full code  **Disclaimer: This note was dictated with voice recognition software. Similar sounding words can inadvertently be transcribed and this note may contain transcription errors which may not have been corrected upon publication of note.Sharene Butters E, PA-C 12/06/2014, 7:37 AM  Tishawna Larouche, MD 12/06/2014

## 2014-12-06 NOTE — Care Management Note (Signed)
Case Management Note  Patient Details  Name: [name] MRN: [id number] Date of Birth: 03-09-1956  CM consult for home VAC. Pt not projected to DC over the weekend.  [employer] home Vac form placed on chart for MD to sign to be faxed to [facility] prior to DC. MD office called and message left for MD to sign Vac form to start the home Vac process. CM will continue to follow.  Lynnell Catalan, RN 12/06/2014, 1:16 PM

## 2014-12-06 NOTE — Progress Notes (Signed)
INPATIENT PROGRESS NOTE  24 hour events: Patient doing better today. Passing flatus in bag. Wound vac applied. Less pain.  Assessment:  59 y.o. year old POD 80 s/p recurrent endometrioid endometrial/ovarian cancer s/p exploratory laparotomy, posterior supralevator pelvic exenteration, end colostomy, bladder repair, ureteral stenting on 11/09/14, POD 2 s/p laparoscopic revision of colostomy for peristomal abscess/fistula.  Plan: 1/ Peri-stomal infection/cellulitis: continue IV antibiotics. Continue wound vac.  2/ Chronic neutropenia (idiopathic): Dr Alvy Bimler is followup. Appreciate this. Granix prn pending WBC and clinical picture.  3/ Complex bladder repair with ureteral stents, foley placement. Continue foley catheter. Do not remove. Plan on retrograde cystogram in 1-2 weeks. Stent removal 6 weeks postop pending CT urogram results.  4/ Left lower extremity edema: Dopplers negative for DVT. Given appropriate postop Hb, will restart Lovenox prophylaxis.  5/ Acute blood loss anemia: s/p transfusion 2 units on 12/04/14. Appropriate risk in Hb. Continue to monitor.  6/ type II DM: resume meds  7/ hypomagnesemia, hypokalemia- adequately repleted today.  8/ Pain management: will write for PCA today. And ambien prn in evenings.  9/ GI function: full liquids. Will advance per Dr Clyda Greener recommendation.   HPI: [name] is a 59 y.o. 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 She then underwent a posterior supralevator exenteration with colostomy and bladder repair and stent placement on [id number] without complications. Her postoperative course was uncomplicate with the exception of development of postop anemia. Her final pathology revealed endometrioid adenocarcinoma invading the vagina and rectum with negative margins on the specimen. The margin had been close (clinically)  to the right pelvic sidewall which was m.  Postoperatively she developed cellulitis lateral to the colostomy site. She was admitted to the hospital on 12/02/14 but showed minimal improvement with IV antibiotics. CT abdo/pelvis on 12/02/14 showed no abscess or apparent fistula. 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 The patient was taken back to the OR on 12/04/14 for laparoscopic colostomy revision and repositioning in the upper abdomen. The prior colostomy site was kept open at the subQ with wet to dry dressings. A wound vac was applied on POD 2.   Review of systems: Constitutional: She has no weight gain or weight loss. She has a low grade fever no chills. Eyes: No blurred vision Ears, Nose, Mouth, Throat: No dizziness, headaches or changes in hearing. No mouth sores. Cardiovascular: No chest pain, palpitations or edema. Respiratory: No shortness of breath, wheezing or cough Gastrointestinal: She has normal bowel movements trhough stoma without diarrhea or constipation. She denies any nausea or vomiting. She denies blood in her stool or heart burn. Genitourinary: She denies pelvic pain, pelvic pressure or changes in her urinary function. She has no hematuria, dysuria, or incontinence (foley in situ). She has no irregular vaginal bleeding or vaginal discharge Musculoskeletal: Denies muscle weakness or joint pains.  Skin: She has no skin changes, rashes or itching Neurological: Denies dizziness or headaches. No neuropathy, no numbness or tingling. Psychiatric: She denies depression or anxiety. Hematologic/Lymphatic: No easy bruising or bleeding  Allergies  Allergen Reactions  . Penicillins Swelling    Facial swelling  . Ultram [Tramadol] Hives  . Adhesive [Tape]     blisters  . Cefaclor Rash    Ceclor  . Erythromycin     gastritis  . Trimethoprim Rash  .  Sulfa Antibiotics Rash  Current Facility-Administered Medications on File Prior to Encounter  Medication Dose Route Frequency Provider Last Rate Last Dose  . Tbo-Filgrastim (GRANIX) injection 480 mcg 480 mcg Subcutaneous Weekly Heath Lark, MD  480 mcg at 11/29/14 1338   Current Outpatient Prescriptions on File Prior to Encounter  Medication Sig Dispense Refill  . acetaminophen (TYLENOL) 500 MG tablet Take 2 tablets (1,000 mg total) by mouth 3 (three) times daily. 30 tablet 0  . Calcium Carbonate-Vitamin D (CALCIUM + D PO) Take 2 tablets by mouth daily.    . Canagliflozin (INVOKANA) 300 MG TABS Take 1 tablet by mouth every morning.     . Cholecalciferol (VITAMIN D3) 10000 UNITS capsule Take 10,000 Units by mouth once a week. Sundays    . docusate sodium (COLACE) 100 MG capsule Take 100 mg by mouth daily.    [name] Kitchen enoxaparin (LOVENOX) 40 MG/0.4ML injection Inject 0.4 mLs (40 mg total) into the skin daily. 21 Syringe 1  . ferrous sulfate 325 (65 FE) MG tablet Take 1 tablet (325 mg total) by mouth 2 (two) times daily with a meal. 60 tablet 3  . hydrochlorothiazide (HYDRODIURIL) 12.5 MG tablet Take 12.5 mg by mouth every morning.     [name] Kitchen ibuprofen (ADVIL,MOTRIN) 800 MG tablet     . levothyroxine (SYNTHROID) 175 MCG tablet Take 175 mcg by mouth daily before breakfast.    . metFORMIN (GLUCOPHAGE) 1000 MG tablet Take 1,000 mg by mouth 2 (two) times daily with a meal.     . Multiple Vitamin (MULTIVITAMIN WITH MINERALS) TABS tablet Take 1 tablet by mouth daily.    [name] Kitchen omega-3 acid ethyl esters (LOVAZA) 1 G capsule Take 1 g by mouth 2 (two) times daily.    [name] Kitchen omeprazole (PRILOSEC) 20 MG capsule Take 20 mg by mouth daily.    . ondansetron (ZOFRAN) 4 MG tablet Take 1 tablet (4 mg total) by mouth every 6 (six) hours as needed for nausea. 20 tablet 0  . ONETOUCH VERIO test strip     . oxyCODONE (OXY  IR/ROXICODONE) 5 MG immediate release tablet Take 1-2 tablets (5-10 mg total) by mouth every 4 (four) hours as needed for moderate pain, severe pain or breakthrough pain. 30 tablet 0  . Polyethyl Glycol-Propyl Glycol (SYSTANE OP) Apply 1 drop to eye daily as needed (Dry eyes).     [name] Kitchen PROVENTIL HFA 108 (90 BASE) MCG/ACT inhaler   0  . rosuvastatin (CRESTOR) 10 MG tablet Take 10 mg by mouth every evening.     . sitaGLIPtin (JANUVIA) 100 MG tablet Take 100 mg by mouth daily.    . Tbo-Filgrastim (GRANIX) 480 MCG/0.8ML SOSY injection Inject 480 mcg into the skin once a week.    . traMADol (ULTRAM) 50 MG tablet   0  . valsartan (DIOVAN) 320 MG tablet Take 320 mg by mouth every morning.      Past Medical History  Diagnosis Date  . Hypothyroidism 12/14/2011  . Benign essential HTN 12/14/2011  . Neutropenia   . Postmenopausal bleeding 09/05/2014  . Anemia     Planned Neupogen injections for chronic neutropenia  . Transfusion history     '06-s/p hysterectomy and staging  . Multiple thyroid nodules y-8    Managed by Dr. Harlow Asa  . GERD (gastroesophageal reflux disease)   . Polyp of duodenum y-2  . DM type 2 (diabetes mellitus, type 2) 12/14/2011  . Dysrhythmia     skipped beat with occasional coffee   . Hx of bronchitis   .  Depression   . Hiatal hernia y-12    hx of small hiatal hernia   . Neutropenia     chronic   . Cancer     '06-Ovarian/ Uterine Cancer-chemotherapy  . 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 Appendectomy    . Tonsillectomy    . Cholecystectomy      laparoscopic  . Eye surgery      pytosis of eyelids-child  . Colonoscopy with propofol N/A 08/21/2013    Procedure: COLONOSCOPY WITH PROPOFOL; Surgeon: Cleotis Nipper, MD; Location: [facility]; Service: Endoscopy;  Laterality: N/A;  . Abdominal hysterectomy      staging for Ovarian cancer  . Eus N/A 10/02/2014    Procedure: LOWER ENDOSCOPIC ULTRASOUND (EUS); Surgeon: Arta Silence, MD; Location: [facility]; Service: Endoscopy; Laterality: N/A;  . Robotic assisted lap vaginal hysterectomy N/A 11/19/2014    Procedure: ROBOTIC LYSIS OF ADHESIONS, CONVERTED TO LAPAROTOMY RADICAL UPPER VAGINECTOMY,LOW ANTERIOR BOWEL RESECTION, COLOSTOMY, BILATERAL URETERAL STENT PLACEMENT AND CYSTONOMY CLOSURE; Surgeon: Everitt Amber, MD; Location: [facility]; Service: Gynecology; Laterality: N/A;  . Ostomy N/A 11/19/2014    Procedure: OSTOMY; Surgeon: Michael [name], MD; Location: [facility]; Service: General; Laterality: N/A;    Family History  Problem Relation Age of Onset  . Cancer Mother     stomach ca  . Hypertension Mother   . Cancer Father     prostate ca  . Diabetes Father   . Diabetes Sister   . Hypertension Brother y-10    History   Social History  . Marital Status: Married    Spouse Name: N/A  . Number of Children: N/A  . Years of Education: N/A   Occupational History  . Not on file.   Social History Main Topics  . Smoking status: Never Smoker   . Smokeless tobacco: Never Used  . Alcohol Use: Yes     Comment: rare social  . Drug Use: No  . Sexual Activity: Not Currently   Other Topics Concern  . Not on file   Social History Narrative   CBC    Component Value Date/Time   WBC 3.0* 12/06/2014 0420   WBC 2.1* 11/29/2014 1251   WBC 2.4* 08/25/2014 1541   RBC 3.37* 12/06/2014 0420   RBC 2.66* 11/29/2014 1251   RBC 4.75 08/25/2014 1541   HGB 9.2* 12/06/2014 0420   HGB 7.2* 11/29/2014 1251   HGB 13.0 08/25/2014 1541   HCT 28.8* 12/06/2014 0420   HCT 23.4* 11/29/2014 1251   HCT 41.3 08/25/2014 1541   PLT 411* 12/06/2014 0420   PLT 548* 11/29/2014 1251   MCV 85.5  12/06/2014 0420   MCV 88.0 11/29/2014 1251   MCV 86.8 08/25/2014 1541   MCH 27.3 12/06/2014 0420   MCH 27.1 11/29/2014 1251   MCH 27.5 08/25/2014 1541   MCHC 31.9 12/06/2014 0420   MCHC 30.8* 11/29/2014 1251   MCHC 31.6* 08/25/2014 1541   RDW 17.2* 12/06/2014 0420   RDW 17.5* 11/29/2014 1251   LYMPHSABS 0.8 12/06/2014 0420   LYMPHSABS 0.6* 11/29/2014 1251   MONOABS 0.9 12/06/2014 0420   MONOABS 0.8 11/29/2014 1251   EOSABS 0.3 12/06/2014 0420   EOSABS 0.2 11/29/2014 1251   BASOSABS 0.0 12/06/2014 0420   BASOSABS 0.0 11/29/2014 1251    BMET    Component Value Date/Time   NA 136 12/06/2014 0420   NA 140 11/29/2014 1251   K 4.4 12/06/2014 0420   K  3.5 11/29/2014 1251   CL 101 12/06/2014 0420   CO2 27 12/06/2014 0420   CO2 30* 11/29/2014 1251   GLUCOSE 133* 12/06/2014 0420   GLUCOSE 168* 11/29/2014 1251   BUN 7 12/06/2014 0420   BUN 11.2 11/29/2014 1251   CREATININE 0.67 12/06/2014 0420   CREATININE 0.7 11/29/2014 1251   CREATININE 0.84 08/25/2014 1538   CALCIUM 8.4* 12/06/2014 0420   CALCIUM 9.3 11/29/2014 1251   GFRNONAA >60 12/06/2014 0420   GFRNONAA 76 08/25/2014 1538   GFRAA >60 12/06/2014 0420   GFRAA 87 08/25/2014 1538      Physical Exam: Blood pressure 136/60, pulse 94, temperature 98.2 F (36.8 C), temperature source Oral, resp. rate 18, height 5\' 2"  (1.575 m), weight 252 lb 12.8 oz (114.669 kg). General: Well dressed, well nourished in no apparent distress.  HEENT: Normocephalic and atraumatic, no lesions. Extraocular muscles intact. Sclerae anicteric. Pupils equal, round, reactive. No mouth sores or ulcers. Thyroid is normal size, not nodular, midline. Skin: No lesions or rashes. Breasts: deferred Lungs: Clear to auscultation bilaterally. No wheezes. Cardiovascular: Regular rate and rhythm. No murmurs or rubs. Abdomen: Soft, nontender, nondistended. No palpable masses. No hepatosplenomegaly. No ascites. Normal bowel sounds. No hernias.  Midline incision is healing with no drainage or erythema. No drainage. Laparoscopic incisions are clean and dry. To the left of the prior colostomy (lateral border) the blanching erythema and underlying firmness of the subQ tissue, consistent with cellulitis is continuing to regress from yesterday. Wound vac in place. New stoma appears pink and viable. Bag sweat only, no flatus or stoo (bag just changed)l. Genitourinary: deferred Extremities: No cyanosis, clubbing. LLE edema (L>R). No calf tenderness or erythema. No palpable cords. Psychiatric: Mood and affect are appropriate. Neurological: Awake, alert and oriented x 3. Sensation is intact, no neuropathy.  Musculoskeletal: No pain, normal strength and range of motion.  Donaciano Eva, MD      Routing History

## 2014-12-06 NOTE — Care Management Note (Signed)
Case Management Note  Patient Details  Name: Zakia Popham MRN: [id number] Date of Birth: 1955/09/09  MD signed home Vac form and home Vac paperwork faxed to [name] rep. Message also left on [employer] rep voicemail. Spoke with pt who states that she is ok with using [facility] for [facility] services again when [place]. [employer] Vac paperwork left on chart. CM will continue to follow.  Lynnell Catalan, RN 12/06/2014, 3:05 PM

## 2014-12-06 NOTE — Consult Note (Addendum)
WOC ostomy follow up Stoma type/location:  Colostomy to LUQ, current pouch leaking behind barrier. Stomal assessment/size: New stoma from 5/4 is red and edematous, weeping around base which will impair seal. 2 1/2 inches in diameter and above skin level. Peristomal assessment: Intact skin surrounding Output: Small amt flatus, no stool at this time. Ostomy pouching: 1pc. Education provided:  Cut pouch to largest ring on barrier and added barrier ring around edge to maintain seal.  Explained to patient and husband at bedside that pouch seal may only last a day while it is weeping and remaining constantly moist behind the barrier. They are both familiar with pouch application and emptying since pt previously had an ostomy prior to this admission.  Supplies at bedside for staff use if leakage occurs.   WOC wound consult note Reason for Consult: Consult requested to apply Vac dressing to left abd wound where previous stoma was located. Wound type: Full thickness post-op wound Measurement:3X7.5X4 cm with undermining to 4 cm from 7:00 o'clock to 10:00 o'clock Wound bed: Beefy red interspersed with yellow adipose tissue Drainage (amount, consistency, odor) Previous gauze packing which was removed had large amt brownish tan drainage, no odor Periwound: Erythremia and edema remains to 3 cm surrounding wound; size and appearance is greatly improved since previous assessment was performed prior to surgery.  Dressing procedure/placement/frequency: Applied one piece black sponge to 127mm cont suction.  Pt used PCA and tolerated with mod amt discomfort.  [place] team will continue to follow while in the hospital. Plan dressing change on Mon.  Pt will need home health assistance with Vac dressings after discharge. Julien Girt MSN, RN, [place], [place], [place]

## 2014-12-07 ENCOUNTER — Inpatient Hospital Stay (HOSPITAL_COMMUNITY): Payer: BLUE CROSS/BLUE SHIELD

## 2014-12-07 LAB — GLUCOSE, CAPILLARY
Glucose-Capillary: 143 mg/dL — ABNORMAL HIGH (ref 70–99)
Glucose-Capillary: 125 mg/dL — ABNORMAL HIGH (ref 70–99)
Glucose-Capillary: 123 mg/dL — ABNORMAL HIGH (ref 70–99)
Glucose-Capillary: 123 mg/dL — ABNORMAL HIGH (ref 70–99)

## 2014-12-07 LAB — CBC WITH DIFFERENTIAL/PLATELET
Basophils Absolute: 0 10*3/uL (ref 0.0–0.1)
Basophils Relative: 0 % (ref 0–1)
EOS PCT: 3 % (ref 0–5)
Eosinophils Absolute: 0.3 10*3/uL (ref 0.0–0.7)
HCT: 32.5 % — ABNORMAL LOW (ref 36.0–46.0)
Hemoglobin: 10 g/dL — ABNORMAL LOW (ref 12.0–15.0)
Lymphocytes Relative: 11 % — ABNORMAL LOW (ref 12–46)
Lymphs Abs: 1.1 10*3/uL (ref 0.7–4.0)
MCH: 26.5 pg (ref 26.0–34.0)
MCHC: 30.8 g/dL (ref 30.0–36.0)
MCV: 86.2 fL (ref 78.0–100.0)
MONO ABS: 0.7 10*3/uL (ref 0.1–1.0)
Monocytes Relative: 7 % (ref 3–12)
NEUTROS PCT: 79 % — AB (ref 43–77)
Neutro Abs: 8.2 10*3/uL — ABNORMAL HIGH (ref 1.7–7.7)
Platelets: 390 10*3/uL (ref 150–400)
RBC: 3.77 MIL/uL — ABNORMAL LOW (ref 3.87–5.11)
RDW: 16.8 % — ABNORMAL HIGH (ref 11.5–15.5)
WBC MORPHOLOGY: INCREASED
WBC: 10.3 10*3/uL (ref 4.0–10.5)

## 2014-12-07 MED ORDER — GLUCERNA SHAKE PO LIQD
237.0000 mL | Freq: Three times a day (TID) | ORAL | Status: DC
  Filled 2014-12-07 (×17): qty 237

## 2014-12-07 MED ORDER — PROMETHAZINE HCL 25 MG/ML IJ SOLN
12.5000 mg | Freq: Three times a day (TID) | INTRAMUSCULAR | Status: DC | PRN
  Administered 2014-12-07 (×2): 12.5 mg via INTRAVENOUS
  Filled 2014-12-07 (×3): qty 1

## 2014-12-07 NOTE — Progress Notes (Signed)
Patient ID: [name], female   DOB: Aug 29, 1955, 59 y.o.   MRN: [id number] INPATIENT PROGRESS NOTE  24 hour events: C/O cramping when eating.  Assessment:  59 y.o. year old POD 70 s/p recurrent endometrioid endometrial/ovarian cancer s/p exploratory laparotomy, posterior supralevator pelvic exenteration, end colostomy, bladder repair, ureteral stenting on 11/09/14, POD 4 s/p laparoscopic revision of colostomy for peristomal abscess/fistula.  Plan: 1/ Peri-stomal infection/cellulitis--clinically improving: continue IV antibiotics. Continue wound vac.  2/ Chronic neutropenia (idiopathic): WBC stable/no symptoms; Granix ordered  3/ Complex bladder repair with ureteral stents, foley placement. Continue foley catheter. Do not remove. Plan on retrograde cystogram in 1-2 weeks. Stent removal 6 weeks postop pending CT urogram results.   4/ Acute blood loss anemia: s/p transfusion 2 units on 12/04/14. Hemoglobin stable  5/ type II DM:  CBGs in range on oral hypoglycemic agents  7/ ?Nutrtional status--add Glucerna, check daily weights  8/ Pain management: continue PCA for now  9/ GI function: Per [facility]   HPI: [name] is a 59 y.o. 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 She then underwent a posterior supralevator exenteration with colostomy and bladder repair and stent placement on [id number] without complications. Her postoperative course was uncomplicate with the exception of development of postop anemia. Her final pathology revealed endometrioid adenocarcinoma invading the vagina and rectum with negative margins on the specimen. The margin had been close (clinically) to the right pelvic sidewall which was m.  Postoperatively she developed cellulitis lateral to the colostomy site. She was admitted to the hospital on 12/02/14 but showed minimal  improvement with IV antibiotics. CT abdo/pelvis on 12/02/14 showed no abscess or apparent fistula. 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 The patient was taken back to the OR on 12/04/14 for laparoscopic colostomy revision and repositioning in the upper abdomen. The prior colostomy site was kept open at the subQ with wet to dry dressings. A wound vac was applied on POD 2.   Review of systems: Constitutional: She has no weight gain or weight loss. She has a low grade fever no chills. Eyes: No blurred vision Ears, Nose, Mouth, Throat: No dizziness, headaches or changes in hearing. No mouth sores. Cardiovascular: No chest pain, palpitations or edema. Respiratory: No shortness of breath, wheezing or cough Gastrointestinal: She has normal bowel movements trhough stoma. She denies any nausea or vomiting. She complains of cramping with meals Genitourinary: She denies pelvic pain, pelvic pressure or changes in her urinary function. She has no hematuria, dysuria, or incontinence (foley in situ). She has no irregular vaginal bleeding or vaginal discharge Musculoskeletal: Denies muscle weakness or joint pains.  Skin: She has no skin changes, rashes or itching Neurological: Denies dizziness or headaches. No neuropathy, no numbness or tingling. Psychiatric: She c/o depressed mood today Hematologic/Lymphatic: No easy bruising or bleeding  Allergies  Allergen Reactions  . Penicillins Swelling    Facial swelling  . Ultram [Tramadol] Hives  . Adhesive [Tape]     blisters  . Cefaclor Rash    Ceclor  . Erythromycin     gastritis  . Trimethoprim Rash  . Sulfa Antibiotics Rash    Current Facility-Administered Medications on File Prior to Encounter  Medication Dose Route Frequency Provider Last Rate Last Dose  . Tbo-Filgrastim (GRANIX) injection 480 mcg 480  mcg Subcutaneous Weekly Heath Lark, MD  480 mcg at 11/29/14 1338   Current Outpatient Prescriptions on File Prior to Encounter  Medication Sig Dispense Refill  . acetaminophen (TYLENOL) 500 MG tablet Take 2 tablets (1,000 mg total) by mouth 3 (three) times daily. 30 tablet 0  . Calcium Carbonate-Vitamin D (CALCIUM + D PO) Take 2 tablets by mouth daily.    . Canagliflozin (INVOKANA) 300 MG TABS Take 1 tablet by mouth every morning.     . Cholecalciferol (VITAMIN D3) 10000 UNITS capsule Take 10,000 Units by mouth once a week. Sundays    . docusate sodium (COLACE) 100 MG capsule Take 100 mg by mouth daily.    [name] Kitchen enoxaparin (LOVENOX) 40 MG/0.4ML injection Inject 0.4 mLs (40 mg total) into the skin daily. 21 Syringe 1  . ferrous sulfate 325 (65 FE) MG tablet Take 1 tablet (325 mg total) by mouth 2 (two) times daily with a meal. 60 tablet 3  . hydrochlorothiazide (HYDRODIURIL) 12.5 MG tablet Take 12.5 mg by mouth every morning.     [name] Kitchen ibuprofen (ADVIL,MOTRIN) 800 MG tablet     . levothyroxine (SYNTHROID) 175 MCG tablet Take 175 mcg by mouth daily before breakfast.    . metFORMIN (GLUCOPHAGE) 1000 MG tablet Take 1,000 mg by mouth 2 (two) times daily with a meal.     . Multiple Vitamin (MULTIVITAMIN WITH MINERALS) TABS tablet Take 1 tablet by mouth daily.    [name] Kitchen omega-3 acid ethyl esters (LOVAZA) 1 G capsule Take 1 g by mouth 2 (two) times daily.    [name] Kitchen omeprazole (PRILOSEC) 20 MG capsule Take 20 mg by mouth daily.    . ondansetron (ZOFRAN) 4 MG tablet Take 1 tablet (4 mg total) by mouth every 6 (six) hours as needed for nausea. 20 tablet 0  . ONETOUCH VERIO test strip     . oxyCODONE (OXY IR/ROXICODONE) 5 MG immediate release tablet Take 1-2 tablets (5-10 mg total) by mouth every 4 (four) hours as needed for moderate pain, severe pain or breakthrough pain. 30 tablet 0  . Polyethyl Glycol-Propyl Glycol (SYSTANE  OP) Apply 1 drop to eye daily as needed (Dry eyes).     [name] Kitchen PROVENTIL HFA 108 (90 BASE) MCG/ACT inhaler   0  . rosuvastatin (CRESTOR) 10 MG tablet Take 10 mg by mouth every evening.     . sitaGLIPtin (JANUVIA) 100 MG tablet Take 100 mg by mouth daily.    . Tbo-Filgrastim (GRANIX) 480 MCG/0.8ML SOSY injection Inject 480 mcg into the skin once a week.    . traMADol (ULTRAM) 50 MG tablet   0  . valsartan (DIOVAN) 320 MG tablet Take 320 mg by mouth every morning.      Past Medical History  Diagnosis Date  . Hypothyroidism 12/14/2011  . Benign essential HTN 12/14/2011  . Neutropenia   . Postmenopausal bleeding 09/05/2014  . Anemia     Planned Neupogen injections for chronic neutropenia  . Transfusion history     '06-s/p hysterectomy and staging  . Multiple thyroid nodules y-8    Managed by Dr. Harlow Asa  . GERD (gastroesophageal reflux disease)   . Polyp of duodenum y-2  . DM type 2 (diabetes mellitus, type 2) 12/14/2011  . Dysrhythmia     skipped beat with occasional coffee   . Hx of bronchitis   . Depression   . Hiatal hernia y-12    hx of small hiatal hernia   . Neutropenia     chronic   . Cancer     '06-Ovarian/  Uterine Cancer-chemotherapy  . 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 Appendectomy    . Tonsillectomy    . Cholecystectomy      laparoscopic  . Eye surgery      pytosis of eyelids-child  . Colonoscopy with propofol N/A 08/21/2013    Procedure: COLONOSCOPY WITH PROPOFOL; Surgeon: Cleotis Nipper, MD; Location: [facility]; Service: Endoscopy; Laterality: N/A;  . Abdominal hysterectomy      staging for Ovarian cancer  . Eus N/A 10/02/2014    Procedure: LOWER ENDOSCOPIC ULTRASOUND (EUS); Surgeon: Arta Silence, MD; Location: [facility]; Service:  Endoscopy; Laterality: N/A;  . Robotic assisted lap vaginal hysterectomy N/A 11/19/2014    Procedure: ROBOTIC LYSIS OF ADHESIONS, CONVERTED TO LAPAROTOMY RADICAL UPPER VAGINECTOMY,LOW ANTERIOR BOWEL RESECTION, COLOSTOMY, BILATERAL URETERAL STENT PLACEMENT AND CYSTONOMY CLOSURE; Surgeon: Everitt Amber, MD; Location: [facility]; Service: Gynecology; Laterality: N/A;  . Ostomy N/A 11/19/2014    Procedure: OSTOMY; Surgeon: Michael [name], MD; Location: [facility]; Service: General; Laterality: N/A;    Family History  Problem Relation Age of Onset  . Cancer Mother     stomach ca  . Hypertension Mother   . Cancer Father     prostate ca  . Diabetes Father   . Diabetes Sister   . Hypertension Brother y-10    History   Social History  . Marital Status: Married    Spouse Name: N/A  . Number of Children: N/A  . Years of Education: N/A   Occupational History  . Not on file.   Social History Main Topics  . Smoking status: Never Smoker   . Smokeless tobacco: Never Used  . Alcohol Use: Yes     Comment: rare social  . Drug Use: No  . Sexual Activity: Not Currently   Other Topics Concern  . Not on file   Social History Narrative   CBC    Component Value Date/Time   WBC 3.0* 12/06/2014 0420   WBC 2.1* 11/29/2014 1251   WBC 2.4* 08/25/2014 1541   RBC 3.37* 12/06/2014 0420   RBC 2.66* 11/29/2014 1251   RBC 4.75 08/25/2014 1541   HGB 9.2* 12/06/2014 0420   HGB 7.2* 11/29/2014 1251   HGB 13.0 08/25/2014 1541   HCT 28.8* 12/06/2014 0420   HCT 23.4* 11/29/2014 1251   HCT 41.3 08/25/2014 1541   PLT 411* 12/06/2014 0420   PLT 548* 11/29/2014 1251   MCV 85.5 12/06/2014 0420   MCV 88.0 11/29/2014 1251   MCV 86.8 08/25/2014 1541   MCH 27.3 12/06/2014 0420   MCH 27.1 11/29/2014 1251   MCH 27.5 08/25/2014 1541   MCHC 31.9 12/06/2014 0420   MCHC 30.8* 11/29/2014 1251   MCHC 31.6*  08/25/2014 1541   RDW 17.2* 12/06/2014 0420   RDW 17.5* 11/29/2014 1251   LYMPHSABS 0.8 12/06/2014 0420   LYMPHSABS 0.6* 11/29/2014 1251   MONOABS 0.9 12/06/2014 0420   MONOABS 0.8 11/29/2014 1251   EOSABS 0.3 12/06/2014 0420   EOSABS 0.2 11/29/2014 1251   BASOSABS 0.0 12/06/2014 0420   BASOSABS 0.0 11/29/2014 1251    BMET    Component Value Date/Time   NA 136 12/06/2014 0420   NA 140 11/29/2014 1251   K 4.4 12/06/2014 0420   K 3.5 11/29/2014 1251   CL 101 12/06/2014 0420   CO2 27 12/06/2014 0420   CO2 30* 11/29/2014 1251   GLUCOSE 133* 12/06/2014 0420   GLUCOSE 168* 11/29/2014 1251  BUN 7 12/06/2014 0420   BUN 11.2 11/29/2014 1251   CREATININE 0.67 12/06/2014 0420   CREATININE 0.7 11/29/2014 1251   CREATININE 0.84 08/25/2014 1538   CALCIUM 8.4* 12/06/2014 0420   CALCIUM 9.3 11/29/2014 1251   GFRNONAA >60 12/06/2014 0420   GFRNONAA 76 08/25/2014 1538   GFRAA >60 12/06/2014 0420   GFRAA 87 08/25/2014 1538      Physical Exam: Blood pressure 136/60, pulse 94, temperature 98.2 F (36.8 C), temperature source Oral, resp. rate 18, height 5\' 2"  (1.575 m), weight 252 lb 12.8 oz (114.669 kg). General: Well dressed, well nourished in no apparent distress.  HEENT: Normocephalic and atraumatic, no lesions. Extraocular muscles intact. Sclerae anicteric. Pupils equal, round, reactive. No mouth sores or ulcers. Thyroid is normal size, not nodular, midline. Skin: No lesions or rashes. Breasts: deferred Lungs: Clear to auscultation bilaterally. No wheezes. Cardiovascular: Regular rate and rhythm. No murmurs or rubs. Abdomen: Soft, nontender, nondistended. No palpable masses. No hepatosplenomegaly. No ascites. Normal bowel sounds. No hernias. Midline incision is healing with no drainage or erythema. No drainage. Laparoscopic incisions are clean and dry. To the left of the prior colostomy (lateral border) the blanching erythema and underlying firmness of the subQ  tissue, consistent with cellulitis is continuing to regress. Wound vac in place. New stoma appears pink and viable. Bag with formed stool output Genitourinary: deferred Extremities: No cyanosis, clubbing. LLE edema (L>R). No calf tenderness or erythema. No palpable cords. Psychiatric: Mood and affect are appropriate. Neurological: Awake, alert and oriented x 3. Sensation is intact, no neuropathy.  Musculoskeletal: No pain, normal strength and range of motion.  JACKSON-MOORE,Lesley Galentine A       Routing History

## 2014-12-07 NOTE — Progress Notes (Signed)
After the 2 view abd xray, the patient started passing stool through her colostomy. 100cc's emptied of brown loose stool. Will cont to monitor patient.

## 2014-12-07 NOTE — Progress Notes (Signed)
[name]  [address] [place], [place] [address] Phone: [phone] FAX: [phone]    [name] [id number] July 29, 1956  CARE TEAM:  PCP: Leana Gamer., MD  Outpatient Care Team: Patient Care Team: Leana Gamer, MD as PCP - General (Internal Medicine) [name], MD as Referring Physician (Obstetrics and Gynecology) Heath Lark, MD as Consulting Physician (Hematology and Oncology) Everitt Amber, MD as Consulting Physician (Obstetrics and Gynecology) Michael [name], MD as Consulting Physician (General Surgery)  Inpatient Treatment Team: Treatment Team: Attending Provider: Everitt Amber, MD; Consulting Physician: Michael [name], MD; Respiratory Therapist: Lita Mains, RRT; Physician Assistant: Rondel Jumbo, PA-C; Consulting Physician: Heath Lark, MD; Registered Nurse: Nadara Mode, RN; Nurse Practitioner: Dorothyann Gibbs, NP; Registered Nurse: Curlene Labrum, RN; Technician: Ileene Rubens, NT; Registered Nurse: Lupita Shutter., RN; Registered Nurse: Charlesetta Ivory, RN; Registered Nurse: Marcelle Smiling, RN; Registered Nurse: Clearence Ped, RN  Problem List:   Principal Problem:   Cellulitis of left abdominal wall near colostomy Active Problems:   Chronic neutropenia   3 Days Post-Op    12/04/2014 POST-OPERATIVE DIAGNOSIS:   Abdominal wall abscess secondary to colostomy perforation  PROCEDURE: Procedure(s): LAPROSCOPIC LYSIS OF ADHESIONS Laparoscopic mobilization of the splenic flexure RELOCATION OF COLOSTOMY DEBRIDEMENT INITIAL COLOSTOMY SITE  SURGEON: Surgeon(s): Michael [name], MD     PRIOR Procedure(s) 11/19/2014 POST-OPERATIVE DIAGNOSIS: 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 TWO LYMPH NODES, NEGATIVE FOR ATYPIA OR MALIGNANCY (0/2). RESECTION MARGINS, NEGATIVE FOR ATYPIA OR MALIGNANCY.  Microscopic Comment Please see gross description for details. [name] Pathologist, Electronic Signature (Case signed 11/21/2014) Specimen Gross and Clinical Information Specimen(s) Obtained: Colon, segmental resection for tumor, rectosigmoid colon with upper vagina Specimen Clinical Information endometrial cancer (kp) Gross The specimen is received in formalin labeled rectosigmoid colon and upper vagina, and consists of an 18.1 cm in length portion of colon, with the proximal margin stapled and the distal margin open. On the anterior aspect of the colon, there is a 2.7 x 2.6 cm slightly disrupted circular portion of gray-brown mucosa designated by a stitch, which is clinically stated as posterior vaginal wall. The resection margin surrounding the vaginal tissue is inked black, and the distal resection margin is inked black for orientation. The soft tissue surrounding the vaginal tissue along the right side is slightly firm. The lumen of the colon contains a small amount of green-brown fecal material, and the mucosa is tan-pink with normal folding. No mucosal lesions are grossly identified, however, palpating the colon, which overlies the indurated adipose tissue reveals a firm nodule within the possible submucosa and muscularis. The nodule measures approximately 0.6 cm, and is 2.9 cm from the distal resection margin. Sectioning through the distal aspect of the colon and underlying soft tissue reveals a 4.6 x 4.5 x  4.0 cm tan-red, hemorrhagic, softened lesion within the 1 of 2 FINAL for [name], [name] ([id number]) Gross(continued) underlying adipose tissue. The lesion invades into the overlying muscularis propria at the area previously noted. The colon wall measures 0.5 cm in thickness. Three  tan-pink possible lymph nodes are identified, ranging from 0.2 cm to 0.5 cm in greatest dimension. Please note the patient is status post chemotherapy. Representative sections are submitted in twelve cassettes. A = proximal resection margin. B, C = distal resection margin. D - F = lesion to overlying colon. G, H = lesion. I, J = vaginal mucosa. K = uninvolved mucosa. L = three possible lymph nodes. (KL:ecj 11/20/2014) Report signed out from the following location(s) Technical component and interpretation was performed at [facility]  Cellulitis with abscess lateral to colostomy with stool due to absominal wall perforation - improved  Plan:  IV ABx.  Vanco/Clindamycin with ### ABx allergies.    wound vac MWF   Anemia.  Transfused Thur- follow - transfuse if Hgb <7 and symptomatic if GynOnc Dr Rossi/LJM agree.  ?Reinvolve Hematology/oncology - CSF to help stim BMarrow?  Pt asymptomatic.    Low K  - replacing & better  Pain control -  Continue tylenol RTC.  PO oxycodone w IV Dilaudid backup.  PCA per Dr Ubaldo Glassing - DO NOT REMOVE due to bladder repair & ureteral stenting.  She had complex cystotomy repair (trigone and base) and ureteral stenting. Will perform retrograde cystogram in 12May will d.c foley at that time IF no leak noted. Remove ureteral stents at 6 weeks if no stricture/leak on CT urogram.  DRAIN Removed 12/04/2014.  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 TWO LYMPH NODES, NEGATIVE FOR ATYPIA OR MALIGNANCY (0/2). I am skeptical that only 2 LN were  found in 18cm of intact mesorectum & mesocolon although not a colon adenoCA = may not be usually drainage point for the recurrent endometriod CA  RESECTION MARGINS, NEGATIVE FOR ATYPIA OR MALIGNANCY.  Glc control - ? SSI.  Restarting PO hypoglycemics  HTN - OK off meds - restart gradually.  GERD - protonix daily  -VTE prophylaxis- SCDs, etc  -mobilize as tolerated to help recovery  -told pt to limit solid foods until cramping better and ostomy edema going down   I updated the patient's status to the patient.   Recommendations were made.  Questions were answered.  The patient expressed understanding & appreciation.     12/07/2014  Subjective:  3W onc floor  Pain controlled w PCA No n/v.  Trying soft diet, but unable to eat much due to cramping, tolerating liquids ok   Objective:  Vital signs:  Filed Vitals:   12/07/14 0443 12/07/14 0534 12/07/14 0657 12/07/14 0800  BP:   153/64   Pulse: 78 84 76   Temp:   98.1 F (36.7 C)   TempSrc:   Oral   Resp: 18 20 20 18   Height:      Weight:      SpO2: 97% 94% 98% 89%    Last BM Date: 12/07/14  Intake/Output   Yesterday:  05/06 0701 - 05/07 0700 In: 480 [P.O.:480] Out: 1700 [Urine:1550; Stool:150] This shift:     Bowel function:  Flatus: scant  BM: scant serous  Drain: n/a  Physical Exam:  General: Pt awake/alert/oriented in no acute distress.  Tired HENT: Normocephalic, Mucus membranes moist.  No thrush.   Neck: Supple, No tracheal deviation Abdomen: Soft.  Nondistended.  Incision closed.   LLQ old colostomy site with minimal induration & erythema, wound vac in place.  New LUQ colostomy pink w edema Ext:  SCDs BLE.  No mjr edema.  No cyanosis Skin: No petechiae / purpura  Results:   Labs: Results for orders placed or performed during the hospital encounter of 12/02/14 (from the past 48 hour(s))  Glucose, capillary     Status: Abnormal   Collection Time: 12/05/14 11:41 AM  Result Value Ref Range    Glucose-Capillary 132 (H) 70 - 99 mg/dL  Glucose, capillary     Status: Abnormal   Collection Time: 12/05/14  5:16 PM  Result Value Ref Range   Glucose-Capillary 114 (H) 70 - 99 mg/dL   Comment 1 Notify RN    Comment 2 Document in Chart   Glucose, capillary     Status: Abnormal   Collection Time: 12/05/14  8:04 PM  Result Value Ref Range   Glucose-Capillary 143 (H) 70 - 99 mg/dL   Comment 1 Notify RN   Glucose, capillary     Status: Abnormal   Collection Time: 12/05/14 11:58 PM  Result Value Ref Range   Glucose-Capillary 136 (H) 70 - 99 mg/dL   Comment 1 Notify RN   CBC WITH DIFFERENTIAL     Status: Abnormal   Collection Time: 12/06/14  4:20 AM  Result Value Ref Range   WBC 3.0 (L) 4.0 - 10.5 K/uL   RBC 3.37 (L) 3.87 - 5.11 MIL/uL   Hemoglobin 9.2 (L) 12.0 - 15.0 g/dL   HCT 28.8 (L) 36.0 - 46.0 %   MCV 85.5 78.0 - 100.0 fL   MCH 27.3 26.0 - 34.0 pg   MCHC 31.9 30.0 - 36.0 g/dL   RDW 17.2 (H) 11.5 - 15.5 %   Platelets 411 (H) 150 - 400 K/uL   Neutrophils Relative % 32 (L) 43 - 77 %   Lymphocytes Relative 28 12 - 46 %   Monocytes Relative 29 (H) 3 - 12 %   Eosinophils Relative 10 (H) 0 - 5 %   Basophils Relative 1 0 - 1 %   Neutro Abs 1.0 (L) 1.7 - 7.7 K/uL   Lymphs Abs 0.8 0.7 - 4.0 K/uL   Monocytes Absolute 0.9 0.1 - 1.0 K/uL   Eosinophils Absolute 0.3 0.0 - 0.7 K/uL   Basophils Absolute 0.0 0.0 - 0.1 K/uL   RBC Morphology POLYCHROMASIA PRESENT   Basic metabolic panel     Status: Abnormal   Collection Time: 12/06/14  4:20 AM  Result Value Ref Range   Sodium 136 135 - 145 mmol/L   Potassium 4.4 3.5 - 5.1 mmol/L   Chloride 101 101 - 111 mmol/L   CO2 27 22 - 32 mmol/L   Glucose, Bld 133 (H) 70 - 99 mg/dL   BUN 7 6 - 20 mg/dL   Creatinine, Ser 0.67 0.44 - 1.00 mg/dL   Calcium 8.4 (L) 8.9 - 10.3 mg/dL   GFR calc non Af Amer >60 >60 mL/min   GFR calc Af Amer >60 >60 mL/min    Comment: (NOTE) The eGFR has been calculated using the CKD EPI equation. This calculation has  not been validated in all clinical situations. eGFR's persistently <60 mL/min signify possible Chronic Kidney Disease.    Anion gap 8 5 - 15  Glucose, capillary     Status: Abnormal   Collection Time: 12/06/14  4:20 AM  Result Value Ref Range   Glucose-Capillary 127 (H) 70 - 99 mg/dL   Comment 1 Notify RN   Glucose, capillary     Status: Abnormal   Collection Time: 12/06/14  7:45 AM  Result Value Ref Range   Glucose-Capillary 144 (H) 70 - 99 mg/dL  Glucose, capillary     Status: Abnormal   Collection Time: 12/06/14 11:55 AM  Result Value Ref Range   Glucose-Capillary 118 (H) 70 - 99 mg/dL  Glucose, capillary     Status: Abnormal   Collection Time: 12/06/14  4:10 PM  Result Value Ref Range   Glucose-Capillary 164 (H) 70 - 99 mg/dL  Glucose, capillary     Status: Abnormal   Collection Time: 12/06/14 10:41 PM  Result Value Ref Range   Glucose-Capillary 108 (H) 70 - 99 mg/dL   Comment 1 Notify RN   Glucose, capillary     Status: Abnormal   Collection Time: 12/07/14  7:45 AM  Result Value Ref Range   Glucose-Capillary 143 (H) 70 - 99 mg/dL    Imaging / Studies: No results found.  Medications / Allergies: per chart  Antibiotics: Anti-infectives    Start     Dose/Rate Route Frequency Ordered Stop   12/05/14 0600  gentamicin (GARAMYCIN) IVPB 100 mg  Status:  Discontinued    Comments:  Pharmacy may adjust dosing strength, schedule, rate of infusion, etc as needed to optimize therapy Send with patient on call to the OR.  Anest[facility] to complete antibiotic [facility]   100 mg 200 mL/hr over 30 Minutes Intravenous On call to O.R. 12/04/14 1346 12/04/14 1352   12/04/14 1400  gentamicin (GARAMYCIN) 380 mg in dextrose 5 % 100 mL IVPB     5 mg/kg  75.4 kg (Adjusted) 109.5 mL/hr over 60 Minutes Intravenous  Once 12/04/14 1349 12/04/14 1430   12/03/14 0600  vancomycin (VANCOCIN) IVPB 1000 mg/200 mL premix     1,000 mg 200 mL/hr over 60  Minutes Intravenous Every 12 hours 12/02/14 1817     12/02/14 1930  clindamycin (CLEOCIN) IVPB 600 mg     600 mg 100 mL/hr over 30 Minutes Intravenous 4 times per day 12/02/14 1847     12/02/14 1830  metroNIDAZOLE (FLAGYL) IVPB 500 mg  Status:  Discontinued     500 mg 100 mL/hr over 60 Minutes Intravenous Every 8 hours 12/02/14 1816 12/02/14 1847   12/02/14 1815  vancomycin (VANCOCIN) 2,000 mg in sodium chloride 0.9 % 500 mL IVPB     2,000 mg 250 mL/hr over 120 Minutes Intravenous  Once 12/02/14 1814 12/02/14 2249   12/02/14 1815  ciprofloxacin (CIPRO) IVPB 400 mg  Status:  Discontinued     400 mg 200 mL/hr over 60 Minutes Intravenous Every 12 hours 12/02/14 1816 12/02/14 1847   12/02/14 1800  metroNIDAZOLE (FLAGYL) IVPB 500 mg  Status:  Discontinued     500 mg 100 mL/hr over 60 Minutes Intravenous Every 8 hours 12/02/14 1649 12/02/14 1733   12/02/14 1700  ciprofloxacin (CIPRO) IVPB 200 mg  Status:  Discontinued     200 mg 100 mL/hr over 60 Minutes Intravenous Every 12 hours 12/02/14 1646 [id number]       Farhad Burleson C Aryanna Shaver, MD  [facility]    12/07/2014

## 2014-12-07 NOTE — Plan of Care (Signed)
Problem: Phase I Progression Outcomes Goal: OOB as tolerated unless otherwise ordered Outcome: Completed/Met Date Met:  12/07/14 Ambulated in hallway x2 laps with spouse

## 2014-12-08 LAB — TYPE AND SCREEN
ABO/RH(D): A POS
Antibody Screen: NEGATIVE
UNIT DIVISION: 0
Unit division: 0
Unit division: 0
Unit division: 0

## 2014-12-08 LAB — CBC WITH DIFFERENTIAL/PLATELET
Basophils Absolute: 0 10*3/uL (ref 0.0–0.1)
Basophils Relative: 0 % (ref 0–1)
EOS ABS: 0 10*3/uL (ref 0.0–0.7)
Eosinophils Relative: 0 % (ref 0–5)
HCT: 34.8 % — ABNORMAL LOW (ref 36.0–46.0)
Hemoglobin: 10.7 g/dL — ABNORMAL LOW (ref 12.0–15.0)
Lymphocytes Relative: 7 % — ABNORMAL LOW (ref 12–46)
Lymphs Abs: 0.6 10*3/uL — ABNORMAL LOW (ref 0.7–4.0)
MCH: 26.6 pg (ref 26.0–34.0)
MCHC: 30.7 g/dL (ref 30.0–36.0)
MCV: 86.6 fL (ref 78.0–100.0)
Monocytes Absolute: 0.6 10*3/uL (ref 0.1–1.0)
Monocytes Relative: 6 % (ref 3–12)
Neutro Abs: 7.9 10*3/uL — ABNORMAL HIGH (ref 1.7–7.7)
Neutrophils Relative %: 87 % — ABNORMAL HIGH (ref 43–77)
PLATELETS: 463 10*3/uL — AB (ref 150–400)
RBC: 4.02 MIL/uL (ref 3.87–5.11)
RDW: 16.6 % — ABNORMAL HIGH (ref 11.5–15.5)
WBC: 9.2 10*3/uL (ref 4.0–10.5)

## 2014-12-08 LAB — GLUCOSE, CAPILLARY
GLUCOSE-CAPILLARY: 156 mg/dL — AB (ref 70–99)
GLUCOSE-CAPILLARY: 129 mg/dL — AB (ref 70–99)
GLUCOSE-CAPILLARY: 134 mg/dL — AB (ref 70–99)
Glucose-Capillary: 173 mg/dL — ABNORMAL HIGH (ref 70–99)

## 2014-12-08 MED ORDER — SODIUM CHLORIDE 0.9 % IJ SOLN: 10.0000 mL | INTRAMUSCULAR | Status: DC | PRN

## 2014-12-08 MED ORDER — SODIUM CHLORIDE 0.9 % IJ SOLN
10.0000 mL | Freq: Two times a day (BID) | INTRAMUSCULAR | Status: DC
  Administered 2014-12-08 – 2014-12-11 (×7): 10 mL
  Administered 2014-12-11: 20 mL
  Administered 2014-12-12: 10 mL

## 2014-12-08 MED ORDER — PROCHLORPERAZINE EDISYLATE 5 MG/ML IJ SOLN
10.0000 mg | Freq: Once | INTRAMUSCULAR | Status: AC
  Administered 2014-12-08: 10 mg via INTRAVENOUS
  Filled 2014-12-08: qty 2

## 2014-12-08 MED ORDER — PROCHLORPERAZINE EDISYLATE 5 MG/ML IJ SOLN
10.0000 mg | Freq: Four times a day (QID) | INTRAMUSCULAR | Status: DC | PRN
  Administered 2014-12-08 – 2014-12-09 (×4): 10 mg via INTRAVENOUS
  Filled 2014-12-08 (×4): qty 2

## 2014-12-08 NOTE — Progress Notes (Signed)
Pt states nausea has eased slightly with the compazine.

## 2014-12-08 NOTE — Progress Notes (Signed)
Patient ID: [name], female   DOB: 12/17/1955, 59 y.o.   MRN: [id number] INPATIENT PROGRESS NOTE  24 hour events: C/O cramping when eating.  Assessment:  59 y.o. year old POD 49 s/p recurrent endometrioid endometrial/ovarian cancer s/p exploratory laparotomy, posterior supralevator pelvic exenteration, end colostomy, bladder repair, ureteral stenting on 11/09/14, POD 5 s/p laparoscopic revision of colostomy for peristomal abscess/fistula.  Plan: 1/ Peri-stomal infection/cellulitis--clinically improving: continue IV antibiotics. Continue wound vac.  2/ Chronic neutropenia (idiopathic): WBC trending up s/p Granix  3/ Complex bladder repair with ureteral stents, foley placement. Continue foley catheter. Do not remove. Plan on retrograde cystogram in 1-2 weeks. Stent removal 6 weeks postop pending CT urogram results.   4/ Acute blood loss anemia: Hemoglobin stable  5/ type II DM:  CBGs in range on oral hypoglycemic agents  7/ ?Nutrtional status/mild dehydration/contraction alkalosis: continue IV hydration  8/ Pain management: continue PCA   9/ GI function: Unable to tolerate po.  Stoma with edema/ with some formed stool output.  AXR with large stool volume.  Nausea controlled with Compazine  HPI: [name] is [name] 59 y.o. 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 She then underwent [name] posterior supralevator exenteration with colostomy and bladder repair and stent placement on [id number] without complications. Her postoperative course was uncomplicate with the exception of development of postop anemia. Her final pathology revealed endometrioid adenocarcinoma invading the vagina and rectum with negative margins on the specimen. The margin had been close (clinically) to the right pelvic sidewall which was m.  Postoperatively she developed cellulitis lateral to the  colostomy site. She was admitted to the hospital on 12/02/14 but showed minimal improvement with IV antibiotics. CT abdo/pelvis on 12/02/14 showed no abscess or apparent fistula. 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 The patient was taken back to the OR on 12/04/14 for laparoscopic colostomy revision and repositioning in the upper abdomen. The prior colostomy site was kept open at the subQ with wet to dry dressings. [name] wound vac was applied on POD 2.   Review of systems: Constitutional: She has no weight gain or weight loss. She has [name] low grade fever no chills. Eyes: No blurred vision Ears, Nose, Mouth, Throat: No dizziness, headaches or changes in hearing. No mouth sores. Cardiovascular: No chest pain, palpitations or edema. Respiratory: No shortness of breath, wheezing or cough Gastrointestinal: She has normal bowel movements trhough stoma. She denies any nausea or vomiting. She complains of cramping with meals Genitourinary: She denies pelvic pain, pelvic pressure or changes in her urinary function. She has no hematuria, dysuria, or incontinence (foley in situ). She has no irregular vaginal bleeding or vaginal discharge Musculoskeletal: Denies muscle weakness or joint pains.  Skin: She has no skin changes, rashes or itching Neurological: Denies dizziness or headaches. No neuropathy, no numbness or tingling. Psychiatric: She c/o less depressed mood today Hematologic/Lymphatic: No easy bruising or bleeding  Allergies  Allergen Reactions  . Penicillins Swelling    Facial swelling  . Ultram [Tramadol] Hives  . Adhesive [Tape]     blisters  . Cefaclor Rash    Ceclor  . Erythromycin     gastritis  . Trimethoprim Rash  . Sulfa Antibiotics Rash    Current Facility-Administered Medications on File Prior to Encounter  Medication Dose Route Frequency  Provider Last Rate Last Dose  .  Tbo-Filgrastim (GRANIX) injection 480 mcg 480 mcg Subcutaneous Weekly Heath Lark, MD  480 mcg at 11/29/14 1338   Current Outpatient Prescriptions on File Prior to Encounter  Medication Sig Dispense Refill  . acetaminophen (TYLENOL) 500 MG tablet Take 2 tablets (1,000 mg total) by mouth 3 (three) times daily. 30 tablet 0  . Calcium Carbonate-Vitamin D (CALCIUM + D PO) Take 2 tablets by mouth daily.    . Canagliflozin (INVOKANA) 300 MG TABS Take 1 tablet by mouth every morning.     . Cholecalciferol (VITAMIN D3) 10000 UNITS capsule Take 10,000 Units by mouth once [name] week. Sundays    . docusate sodium (COLACE) 100 MG capsule Take 100 mg by mouth daily.    [name] Kitchen enoxaparin (LOVENOX) 40 MG/0.4ML injection Inject 0.4 mLs (40 mg total) into the skin daily. 21 Syringe 1  . ferrous sulfate 325 (65 FE) MG tablet Take 1 tablet (325 mg total) by mouth 2 (two) times daily with [name] meal. 60 tablet 3  . hydrochlorothiazide (HYDRODIURIL) 12.5 MG tablet Take 12.5 mg by mouth every morning.     [name] Kitchen ibuprofen (ADVIL,MOTRIN) 800 MG tablet     . levothyroxine (SYNTHROID) 175 MCG tablet Take 175 mcg by mouth daily before breakfast.    . metFORMIN (GLUCOPHAGE) 1000 MG tablet Take 1,000 mg by mouth 2 (two) times daily with [name] meal.     . Multiple Vitamin (MULTIVITAMIN WITH MINERALS) TABS tablet Take 1 tablet by mouth daily.    [name] Kitchen omega-3 acid ethyl esters (LOVAZA) 1 G capsule Take 1 g by mouth 2 (two) times daily.    [name] Kitchen omeprazole (PRILOSEC) 20 MG capsule Take 20 mg by mouth daily.    . ondansetron (ZOFRAN) 4 MG tablet Take 1 tablet (4 mg total) by mouth every 6 (six) hours as needed for nausea. 20 tablet 0  . ONETOUCH VERIO test strip     . oxyCODONE (OXY IR/ROXICODONE) 5 MG immediate release tablet Take 1-2 tablets (5-10 mg total) by mouth every 4 (four) hours as needed for moderate pain, severe  pain or breakthrough pain. 30 tablet 0  . Polyethyl Glycol-Propyl Glycol (SYSTANE OP) Apply 1 drop to eye daily as needed (Dry eyes).     [name] Kitchen PROVENTIL HFA 108 (90 BASE) MCG/ACT inhaler   0  . rosuvastatin (CRESTOR) 10 MG tablet Take 10 mg by mouth every evening.     . sitaGLIPtin (JANUVIA) 100 MG tablet Take 100 mg by mouth daily.    . Tbo-Filgrastim (GRANIX) 480 MCG/0.8ML SOSY injection Inject 480 mcg into the skin once [name] week.    . traMADol (ULTRAM) 50 MG tablet   0  . valsartan (DIOVAN) 320 MG tablet Take 320 mg by mouth every morning.      Past Medical History  Diagnosis Date  . Hypothyroidism 12/14/2011  . Benign essential HTN 12/14/2011  . Neutropenia   . Postmenopausal bleeding 09/05/2014  . Anemia     Planned Neupogen injections for chronic neutropenia  . Transfusion history     '06-s/p hysterectomy and staging  . Multiple thyroid nodules y-8    Managed by Dr. Harlow Asa  . GERD (gastroesophageal reflux disease)   . Polyp of duodenum y-2  . DM type 2 (diabetes mellitus, type 2) 12/14/2011  . Dysrhythmia     skipped beat with occasional coffee   . Hx of bronchitis   . Depression   . Hiatal hernia y-12    hx of small hiatal hernia   . Neutropenia  chronic   . Cancer     '06-Ovarian/ Uterine Cancer-chemotherapy  . 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 Appendectomy    . Tonsillectomy    . Cholecystectomy      laparoscopic  . Eye surgery      pytosis of eyelids-child  . Colonoscopy with propofol N/[name] 08/21/2013    Procedure: COLONOSCOPY WITH PROPOFOL; Surgeon: Cleotis Nipper, MD; Location: [facility]; Service: Endoscopy; Laterality: N/[name];  . Abdominal hysterectomy      staging for Ovarian cancer  . Eus N/[name] 10/02/2014    Procedure: LOWER ENDOSCOPIC  ULTRASOUND (EUS); Surgeon: Arta Silence, MD; Location: [facility]; Service: Endoscopy; Laterality: N/[name];  . Robotic assisted lap vaginal hysterectomy N/[name] 11/19/2014    Procedure: ROBOTIC LYSIS OF ADHESIONS, CONVERTED TO LAPAROTOMY RADICAL UPPER VAGINECTOMY,LOW ANTERIOR BOWEL RESECTION, COLOSTOMY, BILATERAL URETERAL STENT PLACEMENT AND CYSTONOMY CLOSURE; Surgeon: Everitt Amber, MD; Location: [facility]; Service: Gynecology; Laterality: N/[name];  . Ostomy N/[name] 11/19/2014    Procedure: OSTOMY; Surgeon: Michael [name], MD; Location: [facility]; Service: General; Laterality: N/[name];    Family History  Problem Relation Age of Onset  . Cancer Mother     stomach ca  . Hypertension Mother   . Cancer Father     prostate ca  . Diabetes Father   . Diabetes Sister   . Hypertension Brother y-10    History   Social History  . Marital Status: Married    Spouse Name: N/[name]  . Number of Children: N/[name]  . Years of Education: N/[name]   Occupational History  . Not on file.   Social History Main Topics  . Smoking status: Never Smoker   . Smokeless tobacco: Never Used  . Alcohol Use: Yes     Comment: rare social  . Drug Use: No  . Sexual Activity: Not Currently   Other Topics Concern  . Not on file   Social History Narrative   CBC    Component Value Date/Time   WBC 9.2 12/08/2014 0430   WBC 2.1* 11/29/2014 1251   WBC 2.4* 08/25/2014 1541   RBC 4.02 12/08/2014 0430   RBC 2.66* 11/29/2014 1251   RBC 4.75 08/25/2014 1541   HGB 10.7* 12/08/2014 0430   HGB 7.2* 11/29/2014 1251   HGB 13.0 08/25/2014 1541   HCT 34.8* 12/08/2014 0430   HCT 23.4* 11/29/2014 1251   HCT 41.3 08/25/2014 1541   PLT 463* 12/08/2014 0430   PLT 548* 11/29/2014 1251   MCV 86.6 12/08/2014 0430   MCV 88.0 11/29/2014 1251   MCV 86.8 08/25/2014 1541   MCH 26.6 12/08/2014 0430   MCH 27.1 11/29/2014 1251   MCH 27.5  08/25/2014 1541   MCHC 30.7 12/08/2014 0430   MCHC 30.8* 11/29/2014 1251   MCHC 31.6* 08/25/2014 1541   RDW 16.6* 12/08/2014 0430   RDW 17.5* 11/29/2014 1251   LYMPHSABS 0.6* 12/08/2014 0430   LYMPHSABS 0.6* 11/29/2014 1251   MONOABS 0.6 12/08/2014 0430   MONOABS 0.8 11/29/2014 1251   EOSABS 0.0 12/08/2014 0430   EOSABS 0.2 11/29/2014 1251   BASOSABS 0.0 12/08/2014 0430   BASOSABS 0.0 11/29/2014 1251    BMET    Component Value Date/Time   NA 136 12/06/2014 0420   NA 140 11/29/2014 1251   K 4.4 12/06/2014 0420   K 3.5 11/29/2014 1251   CL 101 12/06/2014 0420   CO2 27 12/06/2014 0420   CO2 30* 11/29/2014 1251  GLUCOSE 133* 12/06/2014 0420   GLUCOSE 168* 11/29/2014 1251   BUN 7 12/06/2014 0420   BUN 11.2 11/29/2014 1251   CREATININE 0.67 12/06/2014 0420   CREATININE 0.7 11/29/2014 1251   CREATININE 0.84 08/25/2014 1538   CALCIUM 8.4* 12/06/2014 0420   CALCIUM 9.3 11/29/2014 1251   GFRNONAA >60 12/06/2014 0420   GFRNONAA 76 08/25/2014 1538   GFRAA >60 12/06/2014 0420   GFRAA 87 08/25/2014 1538      Physical Exam: Blood pressure 136/60, pulse 94, temperature 98.2 F (36.8 C), temperature source Oral, resp. rate 18, height 5\' 2"  (1.575 m), weight 252 lb 12.8 oz (114.669 kg). General: Well dressed, well nourished in no apparent distress.  HEENT: Normocephalic and atraumatic, no lesions. Extraocular muscles intact. Sclerae anicteric. Pupils equal, round, reactive. No mouth sores or ulcers. Thyroid is normal size, not nodular, midline. Skin: No lesions or rashes. Breasts: deferred Lungs: Clear to auscultation bilaterally. No wheezes. Cardiovascular: Regular rate and rhythm. No murmurs or rubs. Abdomen: Soft, nontender, nondistended. No palpable masses. No hepatosplenomegaly. No ascites. Normal bowel sounds. No hernias. Midline incision is healing with no drainage or erythema. No drainage. Laparoscopic incisions are clean and dry.  Wound vac in place. New  stoma appears pink and viable. Bag with formed stool output Genitourinary: deferred Extremities: No cyanosis, clubbing. LLE edema (L>R). No calf tenderness or erythema. No palpable cords. Psychiatric: Mood and affect are appropriate. Neurological: Awake, alert and oriented x 3. Sensation is intact, no neuropathy.  Musculoskeletal: No pain, normal strength and range of motion.  [name],[name]       Routing History

## 2014-12-08 NOTE — Progress Notes (Signed)
ANTIBIOTIC CONSULT NOTE - Follow up  Pharmacy Consult for Vancomycin Indication: wound infection/cellulitis/neutropenia  Allergies  Allergen Reactions  . Penicillins Swelling    Facial swelling  . Ultram [Tramadol] Hives  . Adhesive [Tape]     blisters  . Cefaclor Rash    Ceclor  . Erythromycin     Gastritis, abd cramps  . Trimethoprim Rash  . Sulfa Antibiotics Rash    Patient Measurements: Height: 5\' 2"  (157.5 cm) Weight: 267 lb 12.8 oz (121.473 kg) IBW/kg (Calculated) : 50.1   Vital Signs: Temp: 98 F (36.7 C) (05/08 0524) Temp Source: Oral (05/08 0524) BP: 136/59 mmHg (05/08 0524) Intake/Output from previous day: 05/07 0701 - 05/08 0700 In: 1010.7 [P.O.:360; I.V.:238.7; IV Piggyback:412] Out: 2750 [Urine:2350; Emesis/NG output:200; Stool:200] Intake/Output from this shift: Total I/O In: 0  Out: 300 [Urine:200; Stool:100]  Labs:  Recent Labs  12/06/14 0420 12/07/14 1458 12/08/14 0430  WBC 3.0* 10.3 9.2  HGB 9.2* 10.0* 10.7*  PLT 411* 390 463*  CREATININE 0.67  --   --    Estimated Creatinine Clearance: 92.9 mL/min (by C-G formula based on Cr of 0.67). No results for input(s): VANCOTROUGH, VANCOPEAK, VANCORANDOM, GENTTROUGH, GENTPEAK, GENTRANDOM, TOBRATROUGH, TOBRAPEAK, TOBRARND, AMIKACINPEAK, AMIKACINTROU, AMIKACIN in the last 72 hours.   Microbiology: Recent Results (from the past 720 hour(s))  MRSA PCR Screening     Status: None   Collection Time: 11/19/14  9:08 PM  Result Value Ref Range Status   MRSA by PCR NEGATIVE NEGATIVE Final    Comment:        The GeneXpert MRSA Assay (FDA approved for NASAL specimens only), is one component of a comprehensive MRSA colonization surveillance program. It is not intended to diagnose MRSA infection nor to guide or monitor treatment for MRSA infections.   Surgical pcr screen     Status: None   Collection Time: 12/04/14 10:09 AM  Result Value Ref Range Status   MRSA, PCR NEGATIVE NEGATIVE Final   Staphylococcus aureus NEGATIVE NEGATIVE Final    Comment:        The Xpert SA Assay (FDA approved for NASAL specimens in patients over 39 years of age), is one component of a comprehensive surveillance program.  Test performance has been validated by [facility] for patients greater than or equal to 47 year old. It is not intended to diagnose infection nor to guide or monitor treatment.    Assessment: 58 y.o. year old with recurrent endometrioid endometrial/ovarian cancer s/p exploratory laparotomy, posterior supralevator pelvic exenteration, end colostomy, bladder repair, ureteral stenting on 11/09/14, now with surgical site infection peri-stomal in setting of chronic neutropenia. Clinically stable with no signs of sepsis or SIRS. Wound infection/cellulitis: admit to hospital of IV antibiotics.  5/4: To OR. Post-op diagnosis: abd wall abscess secondary to colostomy perforation. Colostomy was relocated.  Today, 12/08/2014: Afebrile since admit. WBC WNL, ANC 7.9 today (has chronic idiopathic neutropenia) SCr wnl and stable, CrCl ~ 93 CG, 85 Normalized. MRSA PCR: negative  Goal of Therapy:  Vancomycin trough level 15-20 mcg/ml (for cellulitis, in setting of chronic neutropenia) Antibiotics per renal function and indication  Plan:  D7 Vancomycin/Clinidamycin -Continue Vancomycin 1g IV q12h -Re-check Vancomycin trough level in AM to assess for accumulation due to patient size. -Continue Clindamycin 600mg  IV q6h per MD. -Follow up renal fxn, culture results, and clinical course. -Follow up de-escalation, duration of therapy.   Lindell Spar, PharmD, BCPS Pager: [phone] 12/08/2014 12:57 PM

## 2014-12-08 NOTE — Progress Notes (Signed)
Peripherally Inserted Central Catheter/Midline Placement  The IV Nurse has discussed with the patient and/or persons authorized to consent for the patient, the purpose of this procedure and the potential benefits and risks involved with this procedure.  The benefits include less needle sticks, lab draws from the catheter and patient may be discharged home with the catheter.  Risks include, but not limited to, infection, bleeding, blood clot (thrombus formation), and puncture of an artery; nerve damage and irregular heat beat.  Alternatives to this procedure were also discussed.  PICC/Midline Placement Documentation  PICC / Midline Double Lumen [id number] PICC Right Basilic 40 cm 2 cm (Active)  Indication for Insertion or Continuance of Line Poor Vasculature-patient has had multiple peripheral attempts or PIVs lasting less than 24 hours 12/08/2014 12:51 PM  Exposed Catheter (cm) 2 cm 12/08/2014 12:51 PM  Site Assessment Clean;Dry;Intact 12/08/2014 12:51 PM  Lumen #1 Status Flushed;Saline locked;Blood return noted 12/08/2014 12:51 PM  Lumen #2 Status Flushed;Saline locked;Blood return noted 12/08/2014 12:51 PM  Dressing Change Due 12/15/14 12/08/2014 12:51 PM       [facility] Payment 12/08/2014, 1:01 PM

## 2014-12-08 NOTE — Progress Notes (Signed)
Dr. Marlou Starks notified of patient's c/o nausea and results of xray of abd. Orders received. Will cont to monitor patient.

## 2014-12-08 NOTE — Progress Notes (Signed)
[facility]  [address] [place], [place] [id number] Phone: [phone] FAX: [phone]    Bryndal Blankenbaker ZC:[id number] 1955/11/08  CARE TEAM:  PCP: Leana Gamer., MD  Outpatient Care Team: Patient Care Team: Leana Gamer, MD as PCP - General (Internal Medicine) [name], MD as Referring Physician (Obstetrics and Gynecology) Heath Lark, MD as Consulting Physician (Hematology and Oncology) Everitt Amber, MD as Consulting Physician (Obstetrics and Gynecology) Michael [name], MD as Consulting Physician (General Surgery)  Inpatient Treatment Team: Treatment Team: Attending Provider: Everitt Amber, MD; Consulting Physician: Michael [name], MD; Respiratory Therapist: Lita Mains, RRT; Physician Assistant: Rondel Jumbo, PA-C; Consulting Physician: Heath Lark, MD; Registered Nurse: Nadara Mode, RN; Nurse Practitioner: Dorothyann Gibbs, NP; Registered Nurse: Curlene Labrum, RN; Technician: Ileene Rubens, NT; Registered Nurse: Lupita Shutter., RN; Registered Nurse: Charlesetta Ivory, RN; Registered Nurse: Marcelle Smiling, RN; Registered Nurse: Clearence Ped, RN; Technician: Suzanna Obey, NT; Registered Nurse: Carilyn Goodpasture, RN  Problem List:   Principal Problem:   Cellulitis of left abdominal wall near colostomy Active Problems:   Chronic neutropenia   4 Days Post-Op    12/04/2014 POST-OPERATIVE DIAGNOSIS:   Abdominal wall abscess secondary to colostomy perforation  PROCEDURE: Procedure(s): LAPROSCOPIC LYSIS OF ADHESIONS Laparoscopic mobilization of the splenic flexure RELOCATION OF COLOSTOMY DEBRIDEMENT INITIAL COLOSTOMY SITE  SURGEON: Surgeon(s): Michael [name], MD     PRIOR Procedure(s) 11/19/2014 POST-OPERATIVE DIAGNOSIS: 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 TWO LYMPH NODES, NEGATIVE FOR ATYPIA OR MALIGNANCY (0/2). RESECTION MARGINS, NEGATIVE FOR ATYPIA OR MALIGNANCY.  Microscopic Comment Please see gross description for details. [name] Pathologist, Electronic Signature (Case signed 11/21/2014) Specimen Gross and Clinical Information Specimen(s) Obtained: Colon, segmental resection for tumor, rectosigmoid colon with upper vagina Specimen Clinical Information endometrial cancer (kp) Gross The specimen is received in formalin labeled rectosigmoid colon and upper vagina, and consists of an 18.1 cm in length portion of colon, with the proximal margin stapled and the distal margin open. On the anterior aspect of the colon, there is a 2.7 x 2.6 cm slightly disrupted circular portion of gray-brown mucosa designated by a stitch, which is clinically stated as posterior vaginal wall. The resection margin surrounding the vaginal tissue is inked black, and the distal resection margin is inked black for orientation. The soft tissue surrounding the vaginal tissue along the right side is slightly firm. The lumen of the colon contains a small amount of green-brown fecal material, and the mucosa is tan-pink with normal folding. No mucosal lesions are grossly identified, however, palpating the colon, which overlies the indurated adipose tissue reveals a firm nodule within the possible submucosa and muscularis. The nodule measures approximately 0.6 cm, and is 2.9 cm from the distal resection margin. Sectioning through the distal  aspect of the colon and underlying soft tissue reveals a 4.6 x 4.5 x 4.0 cm tan-red, hemorrhagic, softened lesion within the 1 of 2 FINAL for [name], [name] [id number]) Gross(continued) underlying adipose tissue. The lesion invades into the overlying muscularis propria at the area previously  noted. The colon wall measures 0.5 cm in thickness. Three tan-pink possible lymph nodes are identified, ranging from 0.2 cm to 0.5 cm in greatest dimension. Please note the patient is status post chemotherapy. Representative sections are submitted in twelve cassettes. A = proximal resection margin. B, C = distal resection margin. D - F = lesion to overlying colon. G, H = lesion. I, J = vaginal mucosa. K = uninvolved mucosa. L = three possible lymph nodes. (KL:ecj 11/20/2014) Report signed out from the following location(s) Technical component and interpretation was performed at [facility]  Cellulitis with abscess lateral to colostomy with stool due to absominal wall perforation - stable  Plan:  IV ABx.  Vanco/Clindamycin with ### ABx allergies.    wound vac MWF   Anemia.  Transfused Thur- follow - transfuse if Hgb <7 and symptomatic if GynOnc Dr Rossi/LJM agree.   Pt asymptomatic.    Low K  - replacing & better  Pain control -  Continue tylenol RTC.  PO oxycodone w IV Dilaudid backup.  PCA per Dr Ubaldo Glassing - DO NOT REMOVE due to bladder repair & ureteral stenting.  She had complex cystotomy repair (trigone and base) and ureteral stenting. Will perform retrograde cystogram in 12May will d.c foley at that time IF no leak noted. Remove ureteral stents at 6 weeks if no stricture/leak on CT urogram.  DRAIN Removed 12/04/2014.  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 TWO LYMPH NODES, NEGATIVE FOR ATYPIA OR MALIGNANCY (0/2). I am skeptical that only  2 LN were found in 18cm of intact mesorectum & mesocolon although not a colon adenoCA = may not be usually drainage point for the recurrent endometriod CA  RESECTION MARGINS, NEGATIVE FOR ATYPIA OR MALIGNANCY.  Glc control - ? SSI.  Restarting PO hypoglycemics  HTN - OK off meds - restart gradually.  GERD - protonix daily  -VTE prophylaxis- SCDs, etc  -mobilize as tolerated to help recovery  -will make NPO for now until nausea resolves   I updated the patient's status to the patient.   Recommendations were made.  Questions were answered.  The patient expressed understanding & appreciation.     12/08/2014  Subjective:  3W onc floor  Pain controlled w PCA No n/v.  Trying soft diet, but unable to eat much due to cramping, tolerating liquids ok   Objective:  Vital signs:  Filed Vitals:   12/08/14 0347 12/08/14 0500 12/08/14 0524 12/08/14 0800  BP:   136/59   Pulse:      Temp:   98 F (36.7 C)   TempSrc:   Oral   Resp:   16 18  Height:      Weight:  121.473 kg (267 lb 12.8 oz)    SpO2: 90%  96% 97%    Last BM Date: 12/08/14  Intake/Output   Yesterday:  05/07 0701 - 05/08 0700 In: 1010.7 [P.O.:360; I.V.:238.7; IV Piggyback:412] Out: 2750 [Urine:2350; Emesis/NG output:200; Stool:200] This shift:  Total I/O In: 0  Out: 300 [Urine:200; Stool:100]  Bowel function:  Flatus: scant  BM: yes  Drain: n/a  Physical Exam:  General: Pt awake/alert/oriented in no acute distress.  Tired HENT: Normocephalic, Mucus membranes moist.  No thrush.   Neck: Supple, No tracheal deviation Abdomen: Soft.  Nondistended.  Incision closed.   LLQ old colostomy wound vac in place.  New  LUQ colostomy beefy red w edema, patent Ext:  SCDs BLE.  No mjr edema.  No cyanosis Skin: No petechiae / purpura  Results:   Labs: Results for orders placed or performed during the hospital encounter of 12/02/14 (from the past 48 hour(s))  Glucose, capillary     Status: Abnormal   Collection  Time: 12/06/14 11:55 AM  Result Value Ref Range   Glucose-Capillary 118 (H) 70 - 99 mg/dL  Glucose, capillary     Status: Abnormal   Collection Time: 12/06/14  4:10 PM  Result Value Ref Range   Glucose-Capillary 164 (H) 70 - 99 mg/dL  Glucose, capillary     Status: Abnormal   Collection Time: 12/06/14 10:41 PM  Result Value Ref Range   Glucose-Capillary 108 (H) 70 - 99 mg/dL   Comment 1 Notify RN   Glucose, capillary     Status: Abnormal   Collection Time: 12/07/14  7:45 AM  Result Value Ref Range   Glucose-Capillary 143 (H) 70 - 99 mg/dL  Glucose, capillary     Status: Abnormal   Collection Time: 12/07/14 11:56 AM  Result Value Ref Range   Glucose-Capillary 125 (H) 70 - 99 mg/dL  CBC with Differential/Platelet     Status: Abnormal   Collection Time: 12/07/14  2:58 PM  Result Value Ref Range   WBC 10.3 4.0 - 10.5 K/uL   RBC 3.77 (L) 3.87 - 5.11 MIL/uL   Hemoglobin 10.0 (L) 12.0 - 15.0 g/dL   HCT 32.5 (L) 36.0 - 46.0 %   MCV 86.2 78.0 - 100.0 fL   MCH 26.5 26.0 - 34.0 pg   MCHC 30.8 30.0 - 36.0 g/dL   RDW 16.8 (H) 11.5 - 15.5 %   Platelets 390 150 - 400 K/uL   Neutrophils Relative % 79 (H) 43 - 77 %   Lymphocytes Relative 11 (L) 12 - 46 %   Monocytes Relative 7 3 - 12 %   Eosinophils Relative 3 0 - 5 %   Basophils Relative 0 0 - 1 %   Neutro Abs 8.2 (H) 1.7 - 7.7 K/uL   Lymphs Abs 1.1 0.7 - 4.0 K/uL   Monocytes Absolute 0.7 0.1 - 1.0 K/uL   Eosinophils Absolute 0.3 0.0 - 0.7 K/uL   Basophils Absolute 0.0 0.0 - 0.1 K/uL   RBC Morphology POLYCHROMASIA PRESENT    WBC Morphology INCREASED BANDS (>20% BANDS)   Glucose, capillary     Status: Abnormal   Collection Time: 12/07/14  4:55 PM  Result Value Ref Range   Glucose-Capillary 123 (H) 70 - 99 mg/dL   Comment 1 Notify RN    Comment 2 Document in Chart   Glucose, capillary     Status: Abnormal   Collection Time: 12/07/14  9:28 PM  Result Value Ref Range   Glucose-Capillary 123 (H) 70 - 99 mg/dL  CBC with Differential      Status: Abnormal   Collection Time: 12/08/14  4:30 AM  Result Value Ref Range   WBC 9.2 4.0 - 10.5 K/uL   RBC 4.02 3.87 - 5.11 MIL/uL   Hemoglobin 10.7 (L) 12.0 - 15.0 g/dL   HCT 34.8 (L) 36.0 - 46.0 %   MCV 86.6 78.0 - 100.0 fL   MCH 26.6 26.0 - 34.0 pg   MCHC 30.7 30.0 - 36.0 g/dL   RDW 16.6 (H) 11.5 - 15.5 %   Platelets 463 (H) 150 - 400 K/uL   Neutrophils Relative % 87 (H) 43 -  77 %   Neutro Abs 7.9 (H) 1.7 - 7.7 K/uL   Lymphocytes Relative 7 (L) 12 - 46 %   Lymphs Abs 0.6 (L) 0.7 - 4.0 K/uL   Monocytes Relative 6 3 - 12 %   Monocytes Absolute 0.6 0.1 - 1.0 K/uL   Eosinophils Relative 0 0 - 5 %   Eosinophils Absolute 0.0 0.0 - 0.7 K/uL   Basophils Relative 0 0 - 1 %   Basophils Absolute 0.0 0.0 - 0.1 K/uL  Glucose, capillary     Status: Abnormal   Collection Time: 12/08/14  7:21 AM  Result Value Ref Range   Glucose-Capillary 156 (H) 70 - 99 mg/dL   Comment 1 Notify RN    Comment 2 Document in Chart     Imaging / Studies: Dg Abd Portable 2v  12/07/2014   CLINICAL DATA:  Nausea and vomiting.  Abdominal pain today.  EXAM: PORTABLE ABDOMEN - 2 VIEW  COMPARISON:  12/02/2014.  FINDINGS: Bilateral double-J ureteral stents are present with distal loops reconstituted in the urinary bladder. Partial reconstitution of the LEFT renal pelvis loop. Surgical clips compatible with pelvic lymphadenectomy. Tubing projects over the abdomen. There is no gross plain film evidence of free air. Cholecystectomy clips are present in the right upper quadrant. Large stool burden. Soft tissue emphysema is present along the LEFT side of the abdomen, likely residual gas from prior CT 12/02/2014.  IMPRESSION: 1. 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 2. No gross plain film evidence of free air. 3. Large stool burden. 4. Double-J ureteral stents and postsurgical changes in the abdomen and pelvis.   Electronically Signed   By: Dereck Ligas M.D.   On: 12/07/2014 19:20    Medications /  Allergies: per chart  Antibiotics: Anti-infectives    Start     Dose/Rate Route Frequency Ordered Stop   12/05/14 0600  gentamicin (GARAMYCIN) IVPB 100 mg  Status:  Discontinued    Comments:  Pharmacy may adjust dosing strength, schedule, rate of infusion, etc as needed to optimize therapy Send with patient on call to the OR.  Anesthesia to complete antibiotic administration <74min prior to incision per [facility].   100 mg 200 mL/hr over 30 Minutes Intravenous On call to O.R. 12/04/14 1346 12/04/14 1352   12/04/14 1400  gentamicin (GARAMYCIN) 380 mg in dextrose 5 % 100 mL IVPB     5 mg/kg  75.4 kg (Adjusted) 109.5 mL/hr over 60 Minutes Intravenous  Once 12/04/14 1349 12/04/14 1430   12/03/14 0600  vancomycin (VANCOCIN) IVPB 1000 mg/200 mL premix     1,000 mg 200 mL/hr over 60 Minutes Intravenous Every 12 hours 12/02/14 1817     12/02/14 1930  clindamycin (CLEOCIN) IVPB 600 mg     600 mg 100 mL/hr over 30 Minutes Intravenous 4 times per day 12/02/14 1847     12/02/14 1830  metroNIDAZOLE (FLAGYL) IVPB 500 mg  Status:  Discontinued     500 mg 100 mL/hr over 60 Minutes Intravenous Every 8 hours 12/02/14 1816 12/02/14 1847   12/02/14 1815  vancomycin (VANCOCIN) 2,000 mg in sodium chloride 0.9 % 500 mL IVPB     2,000 mg 250 mL/hr over 120 Minutes Intravenous  Once 12/02/14 1814 12/02/14 2249   12/02/14 1815  ciprofloxacin (CIPRO) IVPB 400 mg  Status:  Discontinued     400 mg 200 mL/hr over 60 Minutes Intravenous Every 12 hours 12/02/14 1816 12/02/14 1847   12/02/14 1800  metroNIDAZOLE (  FLAGYL) IVPB 500 mg  Status:  Discontinued     500 mg 100 mL/hr over 60 Minutes Intravenous Every 8 hours 12/02/14 1649 12/02/14 1733   12/02/14 1700  ciprofloxacin (CIPRO) IVPB 200 mg  Status:  Discontinued     200 mg 100 mL/hr over 60 Minutes Intravenous Every 12 hours 12/02/14 1646 [id number]        Jeniel Slauson C Vangie Henthorn, MD  [facility]    12/08/2014

## 2014-12-09 LAB — BASIC METABOLIC PANEL WITH GFR
Anion gap: 7 (ref 5–15)
BUN: 7 mg/dL (ref 6–20)
CO2: 31 mmol/L (ref 22–32)
Calcium: 8.1 mg/dL — ABNORMAL LOW (ref 8.9–10.3)
Chloride: 97 mmol/L — ABNORMAL LOW (ref 101–111)
Creatinine, Ser: 0.59 mg/dL (ref 0.44–1.00)
GFR calc Af Amer: >60 mL/min
GFR calc non Af Amer: >60 mL/min
Glucose, Bld: 166 mg/dL — ABNORMAL HIGH (ref 70–99)
Potassium: 2.3 mmol/L — CL (ref 3.5–5.1)
Sodium: 135 mmol/L (ref 135–145)

## 2014-12-09 LAB — CBC WITH DIFFERENTIAL/PLATELET
BASOS ABS: 0 10*3/uL (ref 0.0–0.1)
Basophils Relative: 0 % (ref 0–1)
EOS ABS: 0.3 10*3/uL (ref 0.0–0.7)
Eosinophils Relative: 3 % (ref 0–5)
HCT: 29.7 % — ABNORMAL LOW (ref 36.0–46.0)
Hemoglobin: 9.2 g/dL — ABNORMAL LOW (ref 12.0–15.0)
LYMPHS ABS: 1.1 10*3/uL (ref 0.7–4.0)
LYMPHS PCT: 12 % (ref 12–46)
MCH: 26.6 pg (ref 26.0–34.0)
MCHC: 31 g/dL (ref 30.0–36.0)
MCV: 85.8 fL (ref 78.0–100.0)
Monocytes Absolute: 0.7 10*3/uL (ref 0.1–1.0)
Monocytes Relative: 7 % (ref 3–12)
Neutro Abs: 7.3 10*3/uL (ref 1.7–7.7)
Neutrophils Relative %: 78 % — ABNORMAL HIGH (ref 43–77)
PLATELETS: 369 10*3/uL (ref 150–400)
RBC: 3.46 MIL/uL — ABNORMAL LOW (ref 3.87–5.11)
RDW: 16.5 % — AB (ref 11.5–15.5)
WBC: 9.4 10*3/uL (ref 4.0–10.5)

## 2014-12-09 LAB — MAGNESIUM: MAGNESIUM: 1.5 mg/dL — AB (ref 1.7–2.4)

## 2014-12-09 LAB — GLUCOSE, CAPILLARY
GLUCOSE-CAPILLARY: 154 mg/dL — AB (ref 70–99)
Glucose-Capillary: 176 mg/dL — ABNORMAL HIGH (ref 70–99)
Glucose-Capillary: 99 mg/dL (ref 70–99)

## 2014-12-09 LAB — VANCOMYCIN, TROUGH: VANCOMYCIN TR: 11 ug/mL (ref 10.0–20.0)

## 2014-12-09 MED ORDER — DIPHENHYDRAMINE HCL 12.5 MG/5ML PO ELIX: 12.5000 mg | ORAL_SOLUTION | Freq: Four times a day (QID) | ORAL | Status: DC | PRN

## 2014-12-09 MED ORDER — LACTATED RINGERS IV BOLUS (SEPSIS): 1000.0000 mL | Freq: Three times a day (TID) | INTRAVENOUS | Status: AC | PRN

## 2014-12-09 MED ORDER — ONDANSETRON HCL 4 MG/2ML IJ SOLN: 4.0000 mg | Freq: Four times a day (QID) | INTRAMUSCULAR | Status: DC | PRN

## 2014-12-09 MED ORDER — SODIUM CHLORIDE 0.9 % IJ SOLN: 9.0000 mL | INTRAMUSCULAR | Status: DC | PRN

## 2014-12-09 MED ORDER — DIPHENHYDRAMINE HCL 50 MG/ML IJ SOLN: 12.5000 mg | Freq: Four times a day (QID) | INTRAMUSCULAR | Status: DC | PRN

## 2014-12-09 MED ORDER — POTASSIUM CHLORIDE CRYS ER 20 MEQ PO TBCR
40.0000 meq | EXTENDED_RELEASE_TABLET | Freq: Two times a day (BID) | ORAL | Status: DC
  Administered 2014-12-09 – 2014-12-12 (×7): 40 meq via ORAL
  Filled 2014-12-09 (×8): qty 2

## 2014-12-09 MED ORDER — LACTATED RINGERS IV SOLN
INTRAVENOUS | Status: DC
  Administered 2014-12-09 – 2014-12-10 (×2): via INTRAVENOUS

## 2014-12-09 MED ORDER — POLYETHYLENE GLYCOL 3350 17 G PO PACK
17.0000 g | PACK | Freq: Two times a day (BID) | ORAL | Status: DC
  Administered 2014-12-09 – 2014-12-12 (×5): 17 g via ORAL
  Filled 2014-12-09 (×6): qty 1

## 2014-12-09 MED ORDER — VANCOMYCIN HCL 10 G IV SOLR
1250.0000 mg | Freq: Two times a day (BID) | INTRAVENOUS | Status: DC
  Administered 2014-12-09 – 2014-12-11 (×4): 1250 mg via INTRAVENOUS
  Filled 2014-12-09 (×5): qty 1250

## 2014-12-09 MED ORDER — NALOXONE HCL 0.4 MG/ML IJ SOLN: 0.4000 mg | INTRAMUSCULAR | Status: DC | PRN

## 2014-12-09 MED ORDER — HYDROMORPHONE 0.3 MG/ML IV SOLN
INTRAVENOUS | Status: DC
  Administered 2014-12-09: 1.7 mg via INTRAVENOUS
  Administered 2014-12-09: 2.4 mg via INTRAVENOUS
  Administered 2014-12-09: 2.1 mg via INTRAVENOUS
  Administered 2014-12-09: 15:00:00 via INTRAVENOUS
  Administered 2014-12-10: 1.8 mg via INTRAVENOUS
  Administered 2014-12-10: 1.5 mg via INTRAVENOUS
  Filled 2014-12-09: qty 25

## 2014-12-09 NOTE — Progress Notes (Addendum)
[facility]  [address] [place], [place] [address] Phone: [phone] FAX: [phone]    [name] [id number] 11/09/1955  CARE TEAM:  PCP: Leana Gamer., MD  Outpatient Care Team: Patient Care Team: Leana Gamer, MD as PCP - General (Internal Medicine) [name], MD as Referring Physician (Obstetrics and Gynecology) Heath Lark, MD as Consulting Physician (Hematology and Oncology) Everitt Amber, MD as Consulting Physician (Obstetrics and Gynecology) Michael [name], MD as Consulting Physician (General Surgery)  Inpatient Treatment Team: Treatment Team: Attending Provider: Everitt Amber, MD; Consulting Physician: Michael [name], MD; Respiratory Therapist: Lita Mains, RRT; Physician Assistant: Rondel Jumbo, PA-C; Consulting Physician: Heath Lark, MD; Registered Nurse: Nadara Mode, RN; Nurse Practitioner: Dorothyann Gibbs, NP; Registered Nurse: Curlene Labrum, RN; Technician: Ileene Rubens, NT; Registered Nurse: Lupita Shutter., RN; Registered Nurse: Marcelle Smiling, RN; Registered Nurse: Clearence Ped, RN; Technician: Suzanna Obey, NT; Registered Nurse: Carilyn Goodpasture, RN; Technician: Lucas Mallow, NT  Problem List:   Principal Problem:   Cellulitis of left abdominal wall near colostomy Active Problems:   Chronic neutropenia   5 Days Post-Op    12/04/2014 POST-OPERATIVE DIAGNOSIS:   Abdominal wall abscess secondary to colostomy perforation  PROCEDURE: Procedure(s): LAPROSCOPIC LYSIS OF ADHESIONS Laparoscopic mobilization of the splenic flexure RELOCATION OF COLOSTOMY DEBRIDEMENT INITIAL COLOSTOMY SITE  SURGEON: Surgeon(s): Michael [name], MD     PRIOR Procedure(s) 11/19/2014 POST-OPERATIVE DIAGNOSIS: 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 TWO LYMPH NODES, NEGATIVE FOR ATYPIA OR MALIGNANCY (0/2). RESECTION MARGINS, NEGATIVE FOR ATYPIA OR MALIGNANCY.  Microscopic Comment Please see Cecil Bixby description for details. [name] Pathologist, Electronic Signature (Case signed 11/21/2014) Specimen [name] and Clinical Information Specimen(s) Obtained: Colon, segmental resection for tumor, rectosigmoid colon with upper vagina Specimen Clinical Information endometrial cancer (kp) [name] The specimen is received in formalin labeled rectosigmoid colon and upper vagina, and consists of an 18.1 cm in length portion of colon, with the proximal margin stapled and the distal margin open. On the anterior aspect of the colon, there is a 2.7 x 2.6 cm slightly disrupted circular portion of gray-brown mucosa designated by a stitch, which is clinically stated as posterior vaginal wall. The resection margin surrounding the vaginal tissue is inked black, and the distal resection margin is inked black for orientation. The soft tissue surrounding the vaginal tissue along the right side is slightly firm. The lumen of the colon contains a small amount of green-brown fecal material, and the mucosa is tan-pink with normal folding. No mucosal lesions are grossly identified, however, palpating the colon, which overlies the indurated adipose tissue reveals a firm nodule within the possible submucosa and muscularis. The nodule measures approximately 0.6 cm, and is 2.9 cm from the distal resection margin. Sectioning through the distal aspect of the colon  and underlying soft tissue reveals a 4.6 x 4.5 x 4.0 cm tan-red, hemorrhagic, softened lesion within the 1 of 2 FINAL for [name], [name] ([id number]) [name](continued) underlying adipose tissue. The lesion invades into the overlying muscularis propria at the area previously noted. The  colon wall measures 0.5 cm in thickness. Three tan-pink possible lymph nodes are identified, ranging from 0.2 cm to 0.5 cm in greatest dimension. Please note the patient is status post chemotherapy. Representative sections are submitted in twelve cassettes. A = proximal resection margin. B, C = distal resection margin. D - F = lesion to overlying colon. G, H = lesion. I, J = vaginal mucosa. K = uninvolved mucosa. L = three possible lymph nodes. (KL:ecj 11/20/2014) Report signed out from the following location(s) Technical component and interpretation was performed at [place]  Cellulitis with abscess lateral to colostomy with stool due to absominal wall perforation - improved  Ileus with constipation - resolving  Plan:  Retry PO - liquids  IV ABx.  Vanco/Clindamycin with ### ABx allergies.    Dressing changes to old colostomy site.  Wound vac MWF.  Check today No major purulence/necrosis.   Miralax bowel regimen to help clean her out  Anemia.  Hgb 9s s/p intraop transfusion - follow.  Transfuse if Hgb <7 and symptomatic if GynOnc Dr Rossi/LJM agree.   Less likely now.  ?Reinvolve Hematology/oncology - CSF to help stim BMarrow?  Pt asymptomatic.    Low K  - replacing again - check Mg again  Pain control -  Continue tylenol RTC.  PO oxycodone w IV Dilaudid backup.  Wean off PCA  Foley - DO NOT REMOVE due to bladder repair & ureteral stenting.  She had complex cystotomy repair (trigone and base) and ureteral stenting. Will perform retrograde cystogram in 12May will d.c foley at that time IF no leak noted. Remove ureteral stents at 6 weeks if no stricture/leak on CT urogram.  DRAIN Removed  12/04/2014.  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 TWO LYMPH NODES, NEGATIVE FOR ATYPIA OR MALIGNANCY (0/2). I am skeptical that only 2 LN were found in 18cm of intact mesorectum & mesocolon although not a colon adenoCA = may not be usually drainage point for the recurrent endometriod CA  RESECTION MARGINS, NEGATIVE FOR ATYPIA OR MALIGNANCY.  Glc control - SSI.  PO hypoglycemics.  Remove D5 from IVF  HTN - OK off meds - restart gradually.  GERD - protonix daily  -VTE prophylaxis- SCDs, etc  -mobilize as tolerated to help recovery   I updated the patient's status to the patient & her husband.   Recommendations were made.  Questions were answered.  They expressed understanding & appreciation.   [name], M.D., F.A.C.S. Gastrointestinal and Minimally Invasive Surgery [facility] [address] [place], [place] [address] [phone] Main / Paging   12/09/2014  Subjective:  3W onc floor Soreness down - not needed PCA much Slept OK No n/v.  Wants to retry PO Less abdominal cramping with ##flatus  Objective:  Vital signs:  Filed Vitals:   12/08/14 2056 12/09/14 0346 12/09/14 0445 12/09/14 0744  BP: 132/50  136/56   Pulse: 76  71   Temp: 98.4 F (36.9 C)  98.1 F (36.7 C)   TempSrc: Oral  Oral   Resp: 18 17 17 22   Height:      Weight:   124.331 kg (274 lb 1.6 oz)   SpO2: 91% 90% 91% 93%    Last BM Date: 12/09/14  Intake/Output   Yesterday:  05/08 0701 - 05/09 0700 In: 556.3 [I.V.:452.3; IV Piggyback:104] Out: 2875 [  Urine:2400; Stool:475] This shift:  Total I/O In: -  Out: 100 [Stool:100]  Bowel function:  Flatus: YES  BM: Thick stool  Drain: n/a  Physical Exam:  General: Pt awake/alert/oriented in no acute distress.  Tired but better Eyes: PERRL, normal EOM.  Sclera  clear.  No icterus Neuro: CN II-XII intact w/o focal sensory/motor deficits. Lymph: No head/neck/groin lymphadenopathy Psych:  No delerium/psychosis/paranoia HENT: Normocephalic, Mucus membranes moist.  No thrush.   Neck: Supple, No tracheal deviation Chest: No chest wall pain w good excursion CV:  Pulses intact.  Regular rhythm MS: Normal AROM mjr joints.  No obvious deformity Abdomen: Soft.  Nondistended.  Incision closed.   LLQ old colostomy site no minimal induration & erythema.  Improved No evidence of peritonitis.  No incarcerated hernias.  New LUQ colostomy pink w edema - mild lateral induration Ext:  SCDs BLE.  No mjr edema.  No cyanosis Skin: No petechiae / purpura  Results:   Labs: Results for orders placed or performed during the hospital encounter of 12/02/14 (from the past 48 hour(s))  Glucose, capillary     Status: Abnormal   Collection Time: 12/07/14 11:56 AM  Result Value Ref Range   Glucose-Capillary 125 (H) 70 - 99 mg/dL  CBC with Differential/Platelet     Status: Abnormal   Collection Time: 12/07/14  2:58 PM  Result Value Ref Range   WBC 10.3 4.0 - 10.5 K/uL   RBC 3.77 (L) 3.87 - 5.11 MIL/uL   Hemoglobin 10.0 (L) 12.0 - 15.0 g/dL   HCT 32.5 (L) 36.0 - 46.0 %   MCV 86.2 78.0 - 100.0 fL   MCH 26.5 26.0 - 34.0 pg   MCHC 30.8 30.0 - 36.0 g/dL   RDW 16.8 (H) 11.5 - 15.5 %   Platelets 390 150 - 400 K/uL   Neutrophils Relative % 79 (H) 43 - 77 %   Lymphocytes Relative 11 (L) 12 - 46 %   Monocytes Relative 7 3 - 12 %   Eosinophils Relative 3 0 - 5 %   Basophils Relative 0 0 - 1 %   Neutro Abs 8.2 (H) 1.7 - 7.7 K/uL   Lymphs Abs 1.1 0.7 - 4.0 K/uL   Monocytes Absolute 0.7 0.1 - 1.0 K/uL   Eosinophils Absolute 0.3 0.0 - 0.7 K/uL   Basophils Absolute 0.0 0.0 - 0.1 K/uL   RBC Morphology POLYCHROMASIA PRESENT    WBC Morphology INCREASED BANDS (>20% BANDS)   Glucose, capillary     Status: Abnormal   Collection Time: 12/07/14  4:55 PM  Result Value Ref Range    Glucose-Capillary 123 (H) 70 - 99 mg/dL   Comment 1 Notify RN    Comment 2 Document in Chart   Glucose, capillary     Status: Abnormal   Collection Time: 12/07/14  9:28 PM  Result Value Ref Range   Glucose-Capillary 123 (H) 70 - 99 mg/dL  CBC with Differential     Status: Abnormal   Collection Time: 12/08/14  4:30 AM  Result Value Ref Range   WBC 9.2 4.0 - 10.5 K/uL   RBC 4.02 3.87 - 5.11 MIL/uL   Hemoglobin 10.7 (L) 12.0 - 15.0 g/dL   HCT 34.8 (L) 36.0 - 46.0 %   MCV 86.6 78.0 - 100.0 fL   MCH 26.6 26.0 - 34.0 pg   MCHC 30.7 30.0 - 36.0 g/dL   RDW 16.6 (H) 11.5 - 15.5 %   Platelets 463 (H) 150 - 400 K/uL  Neutrophils Relative % 87 (H) 43 - 77 %   Neutro Abs 7.9 (H) 1.7 - 7.7 K/uL   Lymphocytes Relative 7 (L) 12 - 46 %   Lymphs Abs 0.6 (L) 0.7 - 4.0 K/uL   Monocytes Relative 6 3 - 12 %   Monocytes Absolute 0.6 0.1 - 1.0 K/uL   Eosinophils Relative 0 0 - 5 %   Eosinophils Absolute 0.0 0.0 - 0.7 K/uL   Basophils Relative 0 0 - 1 %   Basophils Absolute 0.0 0.0 - 0.1 K/uL  Glucose, capillary     Status: Abnormal   Collection Time: 12/08/14  7:21 AM  Result Value Ref Range   Glucose-Capillary 156 (H) 70 - 99 mg/dL   Comment 1 Notify RN    Comment 2 Document in Chart   Glucose, capillary     Status: Abnormal   Collection Time: 12/08/14 11:48 AM  Result Value Ref Range   Glucose-Capillary 129 (H) 70 - 99 mg/dL   Comment 1 Notify RN    Comment 2 Document in Chart   Glucose, capillary     Status: Abnormal   Collection Time: 12/08/14  5:04 PM  Result Value Ref Range   Glucose-Capillary 134 (H) 70 - 99 mg/dL  Glucose, capillary     Status: Abnormal   Collection Time: 12/08/14  9:54 PM  Result Value Ref Range   Glucose-Capillary 173 (H) 70 - 99 mg/dL   Comment 1 Notify RN    Comment 2 Document in Chart   Basic metabolic panel     Status: Abnormal   Collection Time: 12/09/14  6:00 AM  Result Value Ref Range   Sodium 135 135 - 145 mmol/L   Potassium 2.3 (LL) 3.5 - 5.1 mmol/L     Comment: REPEATED TO VERIFY CRITICAL RESULT CALLED TO, READ BACK BY AND VERIFIED WITH: J.MAONFELT,RN AT 4825 ON 12/09/14 BY W.SHEA    Chloride 97 (L) 101 - 111 mmol/L   CO2 31 22 - 32 mmol/L   Glucose, Bld 166 (H) 70 - 99 mg/dL   BUN 7 6 - 20 mg/dL   Creatinine, Ser 0.59 0.44 - 1.00 mg/dL   Calcium 8.1 (L) 8.9 - 10.3 mg/dL   GFR calc non Af Amer >60 >60 mL/min   GFR calc Af Amer >60 >60 mL/min    Comment: (NOTE) The eGFR has been calculated using the CKD EPI equation. This calculation has not been validated in all clinical situations. eGFR's persistently <60 mL/min signify possible Chronic Kidney Disease.    Anion gap 7 5 - 15  CBC with Differential     Status: Abnormal   Collection Time: 12/09/14  6:00 AM  Result Value Ref Range   WBC 9.4 4.0 - 10.5 K/uL   RBC 3.46 (L) 3.87 - 5.11 MIL/uL   Hemoglobin 9.2 (L) 12.0 - 15.0 g/dL   HCT 29.7 (L) 36.0 - 46.0 %   MCV 85.8 78.0 - 100.0 fL   MCH 26.6 26.0 - 34.0 pg   MCHC 31.0 30.0 - 36.0 g/dL   RDW 16.5 (H) 11.5 - 15.5 %   Platelets 369 150 - 400 K/uL   Neutrophils Relative % 78 (H) 43 - 77 %   Lymphocytes Relative 12 12 - 46 %   Monocytes Relative 7 3 - 12 %   Eosinophils Relative 3 0 - 5 %   Basophils Relative 0 0 - 1 %   Neutro Abs 7.3 1.7 - 7.7 K/uL   Lymphs  Abs 1.1 0.7 - 4.0 K/uL   Monocytes Absolute 0.7 0.1 - 1.0 K/uL   Eosinophils Absolute 0.3 0.0 - 0.7 K/uL   Basophils Absolute 0.0 0.0 - 0.1 K/uL   Smear Review MORPHOLOGY UNREMARKABLE   Vancomycin, trough     Status: None   Collection Time: 12/09/14  6:00 AM  Result Value Ref Range   Vancomycin Tr 11 10.0 - 20.0 ug/mL  Glucose, capillary     Status: Abnormal   Collection Time: 12/09/14  7:42 AM  Result Value Ref Range   Glucose-Capillary 176 (H) 70 - 99 mg/dL    Imaging / Studies: Dg Abd Portable 2v  12/07/2014   CLINICAL DATA:  Nausea and vomiting.  Abdominal pain today.  EXAM: PORTABLE ABDOMEN - 2 VIEW  COMPARISON:  12/02/2014.  FINDINGS: Bilateral double-J  ureteral stents are present with distal loops reconstituted in the urinary bladder. Partial reconstitution of the LEFT renal pelvis loop. Surgical clips compatible with pelvic lymphadenectomy. Tubing projects over the abdomen. There is no Idali Lafever plain film evidence of free air. Cholecystectomy clips are present in the right upper quadrant. Large stool burden. Soft tissue emphysema is present along the LEFT side of the abdomen, likely residual gas from prior CT 12/02/2014.  IMPRESSION: 1. 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 2. No Nevena Rozenberg plain film evidence of free air. 3. Large stool burden. 4. Double-J ureteral stents and postsurgical changes in the abdomen and pelvis.   Electronically Signed   By: Dereck Ligas M.D.   On: 12/07/2014 19:20    Medications / Allergies: per chart  Antibiotics: Anti-infectives    Start     Dose/Rate Route Frequency Ordered Stop   12/05/14 0600  gentamicin (GARAMYCIN) IVPB 100 mg  Status:  Discontinued    Comments:  Pharmacy may adjust dosing strength, schedule, rate of infusion, etc as needed to optimize therapy Send with patient on call to the OR.  Anesthesia to complete antibiotic administration <12mn [facility]   100 mg 200 mL/hr over 30 Minutes Intravenous On call to O.R. 12/04/14 1346 12/04/14 1352   12/04/14 1400  gentamicin (GARAMYCIN) 380 mg in dextrose 5 % 100 mL IVPB     5 mg/kg  75.4 kg (Adjusted) 109.5 mL/hr over 60 Minutes Intravenous  Once 12/04/14 1349 12/04/14 1430   12/03/14 0600  vancomycin (VANCOCIN) IVPB 1000 mg/200 mL premix     1,000 mg 200 mL/hr over 60 Minutes Intravenous Every 12 hours 12/02/14 1817     12/02/14 1930  clindamycin (CLEOCIN) IVPB 600 mg     600 mg 100 mL/hr over 30 Minutes Intravenous 4 times per day 12/02/14 1847     12/02/14 1830  metroNIDAZOLE (FLAGYL) IVPB 500 mg  Status:  Discontinued     500 mg 100 mL/hr over 60 Minutes Intravenous Every 8 hours 12/02/14 1816  12/02/14 1847   12/02/14 1815  vancomycin (VANCOCIN) 2,000 mg in sodium chloride 0.9 % 500 mL IVPB     2,000 mg 250 mL/hr over 120 Minutes Intravenous  Once 12/02/14 1814 12/02/14 2249   12/02/14 1815  ciprofloxacin (CIPRO) IVPB 400 mg  Status:  Discontinued     400 mg 200 mL/hr over 60 Minutes Intravenous Every 12 hours 12/02/14 1816 12/02/14 1847   12/02/14 1800  metroNIDAZOLE (FLAGYL) IVPB 500 mg  Status:  Discontinued     500 mg 100 mL/hr over 60 Minutes Intravenous Every 8 hours 12/02/14 1649 12/02/14 1733  12/02/14 1700  ciprofloxacin (CIPRO) IVPB 200 mg  Status:  Discontinued     200 mg 100 mL/hr over 60 Minutes Intravenous Every 12 hours 12/02/14 1646 12/02/14 1733       Note: Portions of this report may have been transcribed using voice recognition software. Every effort was made to ensure accuracy; however, inadvertent computerized transcription errors may be present.   Any transcriptional errors that result from this process are unintentional.     [name], M.D., F.A.C.S. Gastrointestinal and Minimally Invasive Surgery [facility] [address], [place], [place] [address] [phone] Main / Paging   12/09/2014

## 2014-12-09 NOTE — Consult Note (Signed)
WOC ostomy follow up Stoma type/location: LUQ Colostomy Stomal assessment/size: red. Edematous, 2 inches round Peristomal assessment: intact Treatment options for stomal/peristomal skin: skin barrier Output soft brown stool Ostomy pouching: 1pc.flexible pouching system with skin barrier ring Education provided: Patient is asking appropriate questions about the stoma appearance, some mucosal lining is still sloughing.  She does not look at stoma or procedure today as she is just recovering from a weekend of nausea and today for the first time again is having a clear liquid diet. Enrolled patient in [place] Start Discharge program: No   WOC wound consult note Reason for Consult: Routine VAC dressing change at old colostomy site. Wound type:surgical Pressure Ulcer POA: No Measurement: 2.5cm x 6cm x 3cm with undermining from 7-10 o'clock measuring 3cm and at 5 o'clock measuring 1.5cm.  Erythema from cellulitis is resolving Wound bed:Red, moist with some subcutaneous adipose tissue evident. Drainage (amount, consistency, odor) scant serous in tubing and in wound bed Periwound:intact. Dressing procedure/placement/frequency: NPWT dressing change without incident and tolerated well with two doses of PCA used. 2 pieces of black foam used to obliterate dead space. Seal achieved immediately and settings of 151mmHg negative continuous pressure reached and maintained.  [place] nursing team will follow, will remain available to this patient, the nursing, surgical and medical teams.   Thanks, Maudie Flakes, MSN, RN, [place], [place], [place] [phone])

## 2014-12-09 NOTE — Progress Notes (Signed)
ANTIBIOTIC CONSULT NOTE - Follow up  Pharmacy Consult for Vancomycin Indication: wound infection/cellulitis/neutropenia  Allergies  Allergen Reactions  . Penicillins Swelling    Facial swelling  . Ultram [Tramadol] Hives  . Adhesive [Tape]     blisters  . Cefaclor Rash    Ceclor  . Erythromycin     Gastritis, abd cramps  . Trimethoprim Rash  . Sulfa Antibiotics Rash    Patient Measurements: Height: 5\' 2"  (157.5 cm) Weight: 274 lb 1.6 oz (124.331 kg) IBW/kg (Calculated) : 50.1   Vital Signs: Temp: 98.1 F (36.7 C) (05/09 0445) Temp Source: Oral (05/09 0445) BP: 136/56 mmHg (05/09 0445) Pulse Rate: 71 (05/09 0445) Intake/Output from previous day: 05/08 0701 - 05/09 0700 In: 556.3 [I.V.:452.3; IV Piggyback:104] Out: 2875 [Urine:2400; Stool:475] Intake/Output from this shift: Total I/O In: -  Out: 100 [Stool:100]  Labs:  Recent Labs  12/07/14 1458 12/08/14 0430 12/09/14 0600  WBC 10.3 9.2 9.4  HGB 10.0* 10.7* 9.2*  PLT 390 463* 369  CREATININE  --   --  0.59   Estimated Creatinine Clearance: 94.2 mL/min (by C-G formula based on Cr of 0.59).  Recent Labs  12/09/14 0600  [place] 11     Microbiology: Recent Results (from the past 720 hour(s))  MRSA PCR Screening     Status: None   Collection Time: 11/19/14  9:08 PM  Result Value Ref Range Status   MRSA by PCR NEGATIVE NEGATIVE Final    Comment:        The GeneXpert MRSA Assay (FDA approved for NASAL specimens only), is one component of a comprehensive MRSA colonization surveillance program. It is not intended to diagnose MRSA infection nor to guide or monitor treatment for MRSA infections.   Surgical pcr screen     Status: None   Collection Time: 12/04/14 10:09 AM  Result Value Ref Range Status   MRSA, PCR NEGATIVE NEGATIVE Final   Staphylococcus aureus NEGATIVE NEGATIVE Final    Comment:        The Xpert SA Assay (FDA approved for NASAL specimens in patients over 21 years of  age), is one component of a comprehensive surveillance program.  Test performance has been validated by [facility] for patients greater than or equal to 1 year old. It is not intended to diagnose infection nor to guide or monitor treatment.    Assessment: 59 y.o. year old with recurrent endometrioid endometrial/ovarian cancer s/p exploratory laparotomy, posterior supralevator pelvic exenteration, end colostomy, bladder repair, ureteral stenting on 11/09/14, now with surgical site infection peri-stomal in setting of chronic neutropenia. Clinically stable with no signs of sepsis or SIRS. Wound infection/cellulitis: admit to hospital of IV antibiotics.  5/4: To OR. Post-op diagnosis: abd wall abscess secondary to colostomy perforation. Colostomy was relocated. 5/6: Wound VAC placed  Today, 12/09/2014: Afebrile since admit. WBC WNL, ANC 7.3 today (has chronic idiopathic neutropenia) SCr wnl and stable, CrCl ~ 94 CG, 85 Normalized. MRSA PCR: negative Repeat Vanc trough = 11 mcg/ml (prev value 17 ~ 9.5 hr after dose)  Goal of Therapy:  Vancomycin trough level 15-20 mcg/ml (for cellulitis, in setting of chronic neutropenia) Antibiotics per renal function and indication  Plan:  D7 Vancomycin/Clinidamycin -Change Vancomycin to 1250mg  IV q12h -Continue Clindamycin 600mg  IV q6h per MD. -Follow up renal fxn, culture results, and clinical course. -Follow up de-escalation, duration of therapy.  Thank you for the consult,  Minda Ditto PharmD Pager [phone] 12/09/2014, 8:32 AM

## 2014-12-09 NOTE — Progress Notes (Signed)
Patient ID: [name], female   DOB: [birth year], 59 y.o.   MRN: [id number] INPATIENT PROGRESS NOTE Delayed entry. Patient seen at 8:45am. 24 hour events: C/O improved nausea. + flatus and stool. Feels "less like an ileus". Hungry  Assessment:  59 y.o. year old POD 70 s/p recurrent endometrioid endometrial/ovarian cancer s/p exploratory laparotomy, posterior supralevator pelvic exenteration, end colostomy, bladder repair, ureteral stenting on 11/09/14, POD 5 s/p laparoscopic revision of colostomy for peristomal abscess/fistula.  Plan: 1/ Peri-stomal infection/cellulitis--clinically improving: continue IV antibiotics. Continue wound vac.  2/ Chronic neutropenia (idiopathic): WBC trending up s/p Granix  3/ Complex bladder repair with ureteral stents, foley placement. Continue foley catheter. Do not remove. Plan on retrograde cystogram in 1-2 weeks. Stent removal 6 weeks postop pending CT urogram results.   4/ Acute blood loss anemia: Hemoglobin stable  5/ type II DM:  CBGs in range on oral hypoglycemic agents  7/ ?Nutrtional status/mild dehydration/contraction alkalosis: continue IV hydration  8/ Pain management: continue PCA per patient request. She feels overwhelmed about its removal today. Discussed we will transition to orals tomorrow if she tolerates PO.  9/ GI function: Delayed return in postop bowel function. Plan for clears per Dr Clyda Greener recommendations. Slow advancement.  HPI: [name] is a 59 y.o. 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 She then underwent a posterior supralevator exenteration with colostomy and bladder repair and stent placement on [id number] without complications. Her postoperative course was uncomplicate with the exception of development of postop anemia. Her final pathology revealed endometrioid adenocarcinoma invading the vagina and  rectum with negative margins on the specimen. The margin had been close (clinically) to the right pelvic sidewall which was m.  Postoperatively she developed cellulitis lateral to the colostomy site. She was admitted to the hospital on 12/02/14 but showed minimal improvement with IV antibiotics. CT abdo/pelvis on 12/02/14 showed no abscess or apparent fistula. 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 The patient was taken back to the OR on 12/04/14 for laparoscopic colostomy revision and repositioning in the upper abdomen. The prior colostomy site was kept open at the subQ with wet to dry dressings. A wound vac was applied on POD 2.   Review of systems: Constitutional: She has no weight gain or weight loss. She has a low grade fever no chills. Eyes: No blurred vision Ears, Nose, Mouth, Throat: No dizziness, headaches or changes in hearing. No mouth sores. Cardiovascular: No chest pain, palpitations or edema. Respiratory: No shortness of breath, wheezing or cough Gastrointestinal: She has normal bowel movements trhough stoma. She denies any nausea or vomiting. She complains of cramping with meals Genitourinary: She denies pelvic pain, pelvic pressure or changes in her urinary function. She has no hematuria, dysuria, or incontinence (foley in situ). She has no irregular vaginal bleeding or vaginal discharge Musculoskeletal: Denies muscle weakness or joint pains.  Skin: She has no skin changes, rashes or itching Neurological: Denies dizziness or headaches. No neuropathy, no numbness or tingling. Psychiatric: She c/o less depressed mood today Hematologic/Lymphatic: No easy bruising or bleeding  Allergies  Allergen Reactions  . Penicillins Swelling    Facial swelling  . Ultram [Tramadol] Hives  . Adhesive [Tape]     blisters  . Cefaclor Rash    Ceclor  . Erythromycin      gastritis  . Trimethoprim Rash  .  Sulfa Antibiotics Rash    Current Facility-Administered Medications on File Prior to Encounter  Medication Dose Route Frequency Provider Last Rate Last Dose  . Tbo-Filgrastim (GRANIX) injection 480 mcg 480 mcg Subcutaneous Weekly Heath Lark, MD  480 mcg at 11/29/14 1338   Current Outpatient Prescriptions on File Prior to Encounter  Medication Sig Dispense Refill  . acetaminophen (TYLENOL) 500 MG tablet Take 2 tablets (1,000 mg total) by mouth 3 (three) times daily. 30 tablet 0  . Calcium Carbonate-Vitamin D (CALCIUM + D PO) Take 2 tablets by mouth daily.    . Canagliflozin (INVOKANA) 300 MG TABS Take 1 tablet by mouth every morning.     . Cholecalciferol (VITAMIN D3) 10000 UNITS capsule Take 10,000 Units by mouth once a week. Sundays    . docusate sodium (COLACE) 100 MG capsule Take 100 mg by mouth daily.    [name] Kitchen enoxaparin (LOVENOX) 40 MG/0.4ML injection Inject 0.4 mLs (40 mg total) into the skin daily. 21 Syringe 1  . ferrous sulfate 325 (65 FE) MG tablet Take 1 tablet (325 mg total) by mouth 2 (two) times daily with a meal. 60 tablet 3  . hydrochlorothiazide (HYDRODIURIL) 12.5 MG tablet Take 12.5 mg by mouth every morning.     [name] Kitchen ibuprofen (ADVIL,MOTRIN) 800 MG tablet     . levothyroxine (SYNTHROID) 175 MCG tablet Take 175 mcg by mouth daily before breakfast.    . metFORMIN (GLUCOPHAGE) 1000 MG tablet Take 1,000 mg by mouth 2 (two) times daily with a meal.     . Multiple Vitamin (MULTIVITAMIN WITH MINERALS) TABS tablet Take 1 tablet by mouth daily.    [name] Kitchen omega-3 acid ethyl esters (LOVAZA) 1 G capsule Take 1 g by mouth 2 (two) times daily.    [name] Kitchen omeprazole (PRILOSEC) 20 MG capsule Take 20 mg by mouth daily.    . ondansetron (ZOFRAN) 4 MG tablet Take 1 tablet (4 mg total) by mouth every 6 (six) hours as needed for nausea. 20 tablet 0  . ONETOUCH VERIO  test strip     . oxyCODONE (OXY IR/ROXICODONE) 5 MG immediate release tablet Take 1-2 tablets (5-10 mg total) by mouth every 4 (four) hours as needed for moderate pain, severe pain or breakthrough pain. 30 tablet 0  . Polyethyl Glycol-Propyl Glycol (SYSTANE OP) Apply 1 drop to eye daily as needed (Dry eyes).     [name] Kitchen PROVENTIL HFA 108 (90 BASE) MCG/ACT inhaler   0  . rosuvastatin (CRESTOR) 10 MG tablet Take 10 mg by mouth every evening.     . sitaGLIPtin (JANUVIA) 100 MG tablet Take 100 mg by mouth daily.    . Tbo-Filgrastim (GRANIX) 480 MCG/0.8ML SOSY injection Inject 480 mcg into the skin once a week.    . traMADol (ULTRAM) 50 MG tablet   0  . valsartan (DIOVAN) 320 MG tablet Take 320 mg by mouth every morning.      Past Medical History  Diagnosis Date  . Hypothyroidism 12/14/2011  . Benign essential HTN 12/14/2011  . Neutropenia   . Postmenopausal bleeding 09/05/2014  . Anemia     Planned Neupogen injections for chronic neutropenia  . Transfusion history     '06-s/p hysterectomy and staging  . Multiple thyroid nodules y-8    Managed by Dr. Harlow Asa  . GERD (gastroesophageal reflux disease)   . Polyp of duodenum y-2  . DM type 2 (diabetes mellitus, type 2) 12/14/2011  . Dysrhythmia     skipped beat with occasional coffee   .  Hx of bronchitis   . Depression   . Hiatal hernia y-12    hx of small hiatal hernia   . Neutropenia     chronic   . Cancer     '06-Ovarian/ Uterine Cancer-chemotherapy  . 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 Appendectomy    . Tonsillectomy    . Cholecystectomy      laparoscopic  . Eye surgery      pytosis of eyelids-child  . Colonoscopy with propofol N/A 08/21/2013    Procedure: COLONOSCOPY WITH PROPOFOL; Surgeon: Cleotis Nipper, MD; Location:  [facility]; Service: Endoscopy; Laterality: N/A;  . Abdominal hysterectomy      staging for Ovarian cancer  . Eus N/A 10/02/2014    Procedure: LOWER ENDOSCOPIC ULTRASOUND (EUS); Surgeon: Arta Silence, MD; Location: [facility]; Service: Endoscopy; Laterality: N/A;  . Robotic assisted lap vaginal hysterectomy N/A 11/19/2014    Procedure: ROBOTIC LYSIS OF ADHESIONS, CONVERTED TO LAPAROTOMY RADICAL UPPER VAGINECTOMY,LOW ANTERIOR BOWEL RESECTION, COLOSTOMY, BILATERAL URETERAL STENT PLACEMENT AND CYSTONOMY CLOSURE; Surgeon: Everitt Amber, MD; Location: [facility]; Service: Gynecology; Laterality: N/A;  . Ostomy N/A 11/19/2014    Procedure: OSTOMY; Surgeon: Michael [name], MD; Location: [facility]; Service: General; Laterality: N/A;    Family History  Problem Relation Age of Onset  . Cancer Mother     stomach ca  . Hypertension Mother   . Cancer Father     prostate ca  . Diabetes Father   . Diabetes Sister   . Hypertension Brother y-10    History   Social History  . Marital Status: Married    Spouse Name: N/A  . Number of Children: N/A  . Years of Education: N/A   Occupational History  . Not on file.   Social History Main Topics  . Smoking status: Never Smoker   . Smokeless tobacco: Never Used  . Alcohol Use: Yes     Comment: rare social  . Drug Use: No  . Sexual Activity: Not Currently   Other Topics Concern  . Not on file   Social History Narrative   CBC    Component Value Date/Time   WBC 9.4 12/09/2014 0600   WBC 2.1* 11/29/2014 1251   WBC 2.4* 08/25/2014 1541   RBC 3.46* 12/09/2014 0600   RBC 2.66* 11/29/2014 1251   RBC 4.75 08/25/2014 1541   HGB 9.2* 12/09/2014 0600   HGB 7.2* 11/29/2014 1251   HGB 13.0 08/25/2014 1541   HCT 29.7* 12/09/2014 0600   HCT 23.4* 11/29/2014 1251   HCT 41.3 08/25/2014 1541   PLT 369 12/09/2014 0600   PLT  548* 11/29/2014 1251   MCV 85.8 12/09/2014 0600   MCV 88.0 11/29/2014 1251   MCV 86.8 08/25/2014 1541   MCH 26.6 12/09/2014 0600   MCH 27.1 11/29/2014 1251   MCH 27.5 08/25/2014 1541   MCHC 31.0 12/09/2014 0600   MCHC 30.8* 11/29/2014 1251   MCHC 31.6* 08/25/2014 1541   RDW 16.5* 12/09/2014 0600   RDW 17.5* 11/29/2014 1251   LYMPHSABS 1.1 12/09/2014 0600   LYMPHSABS 0.6* 11/29/2014 1251   MONOABS 0.7 12/09/2014 0600   MONOABS 0.8 11/29/2014 1251   EOSABS 0.3 12/09/2014 0600   EOSABS 0.2 11/29/2014 1251   BASOSABS 0.0 12/09/2014 0600   BASOSABS 0.0 11/29/2014 1251    BMET    Component Value Date/Time   NA 135 12/09/2014 0600   NA 140 11/29/2014 1251   K  2.3* 12/09/2014 0600   K 3.5 11/29/2014 1251   CL 97* 12/09/2014 0600   CO2 31 12/09/2014 0600   CO2 30* 11/29/2014 1251   GLUCOSE 166* 12/09/2014 0600   GLUCOSE 168* 11/29/2014 1251   BUN 7 12/09/2014 0600   BUN 11.2 11/29/2014 1251   CREATININE 0.59 12/09/2014 0600   CREATININE 0.7 11/29/2014 1251   CREATININE 0.84 08/25/2014 1538   CALCIUM 8.1* 12/09/2014 0600   CALCIUM 9.3 11/29/2014 1251   GFRNONAA >60 12/09/2014 0600   GFRNONAA 76 08/25/2014 1538   GFRAA >60 12/09/2014 0600   GFRAA 87 08/25/2014 1538      Physical Exam: Blood pressure 136/60, pulse 94, temperature 98.2 F (36.8 C), temperature source Oral, resp. rate 18, height 5\' 2"  (1.575 m), weight 252 lb 12.8 oz (114.669 kg). General: Well dressed, well nourished in no apparent distress.  HEENT: Normocephalic and atraumatic, no lesions. Extraocular muscles intact. Sclerae anicteric. Pupils equal, round, reactive. No mouth sores or ulcers. Thyroid is normal size, not nodular, midline. Skin: No lesions or rashes. Breasts: deferred Lungs: Clear to auscultation bilaterally. No wheezes. Cardiovascular: Regular rate and rhythm. No murmurs or rubs. Abdomen: Soft, nontender, nondistended. No palpable masses. No hepatosplenomegaly. No ascites.  Normal bowel sounds. No hernias. Midline incision is healing with no drainage or erythema. No drainage. Laparoscopic incisions are clean and dry.  Wound vac in place. Gas in bag. Genitourinary: deferred Extremities: No cyanosis, clubbing. LLE edema (L>R). No calf tenderness or erythema. No palpable cords. Psychiatric: Mood and affect are appropriate. Neurological: Awake, alert and oriented x 3. Sensation is intact, no neuropathy.  Musculoskeletal: No pain, normal strength and range of motion.  Donaciano Eva       Routing History

## 2014-12-10 LAB — CBC WITH DIFFERENTIAL/PLATELET
BASOS ABS: 0 10*3/uL (ref 0.0–0.1)
BASOS PCT: 0 % (ref 0–1)
EOS PCT: 10 % — AB (ref 0–5)
Eosinophils Absolute: 0.6 10*3/uL (ref 0.0–0.7)
HEMATOCRIT: 28.9 % — AB (ref 36.0–46.0)
HEMOGLOBIN: 8.9 g/dL — AB (ref 12.0–15.0)
Lymphocytes Relative: 16 % (ref 12–46)
Lymphs Abs: 1 10*3/uL (ref 0.7–4.0)
MCH: 26.8 pg (ref 26.0–34.0)
MCHC: 30.8 g/dL (ref 30.0–36.0)
MCV: 87 fL (ref 78.0–100.0)
MONO ABS: 0.7 10*3/uL (ref 0.1–1.0)
Monocytes Relative: 12 % (ref 3–12)
Neutro Abs: 3.8 10*3/uL (ref 1.7–7.7)
Neutrophils Relative %: 62 % (ref 43–77)
Platelets: 335 10*3/uL (ref 150–400)
RBC: 3.32 MIL/uL — AB (ref 3.87–5.11)
RDW: 16.3 % — ABNORMAL HIGH (ref 11.5–15.5)
WBC: 6 10*3/uL (ref 4.0–10.5)

## 2014-12-10 LAB — GLUCOSE, CAPILLARY
GLUCOSE-CAPILLARY: 143 mg/dL — AB (ref 70–99)
Glucose-Capillary: 140 mg/dL — ABNORMAL HIGH (ref 70–99)
Glucose-Capillary: 126 mg/dL — ABNORMAL HIGH (ref 70–99)
Glucose-Capillary: 121 mg/dL — ABNORMAL HIGH (ref 70–99)

## 2014-12-10 NOTE — Progress Notes (Signed)
Patient ID: Surbhi Schoenbauer, female   DOB: [birth year], 59 y.o.   MRN: [id number] INPATIENT PROGRESS NOTE  24 hour events: C/O improved nausea. + flatus and stool. Continues to tolerate PO. Tolerated clears.  Assessment:  59 y.o. year old POD 38 s/p recurrent endometrioid endometrial/ovarian cancer s/p exploratory laparotomy, posterior supralevator pelvic exenteration, end colostomy, bladder repair, ureteral stenting on 11/09/14, POD 5 s/p laparoscopic revision of colostomy for peristomal abscess/fistula.  Plan: 1/ Peri-stomal infection/cellulitis--clinically improving: continue IV antibiotics. Continue wound vac. Will plan on changing IV to PO antibiotics prior to discharge.  2/ Chronic neutropenia (idiopathic): WBC trending up s/p Granix  3/ Complex bladder repair with ureteral stents, foley placement. Continue foley catheter. Do not remove. Plan on retrograde cystogram later this week. Will facilitate this as an inpatient study. Stent removal 6 weeks postop pending CT urogram results.  4/ Acute blood loss anemia: Hemoglobin stable  5/ type II DM:  CBGs in range on oral hypoglycemic agents  7/ ?Nutrtional status/mild dehydration/contraction alkalosis: consider hep locking IV now that patient tolerating PO  8/ Pain management: d.c. PCA and start orals.  9/ GI function: Delayed return in postop bowel function. Plan for diet advancement today per Dr Clyda Greener recommendations. Slow advancement.  HPI: Loyce Atamian is a 59 y.o. 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 She then underwent a posterior supralevator exenteration with colostomy and bladder repair and stent placement on [id number] without complications. Her postoperative course was uncomplicate with the exception of development of postop anemia. Her final pathology revealed endometrioid adenocarcinoma invading the vagina  and rectum with negative margins on the specimen. The margin had been close (clinically) to the right pelvic sidewall which was m.  Postoperatively she developed cellulitis lateral to the colostomy site. She was admitted to the hospital on 12/02/14 but showed minimal improvement with IV antibiotics. CT abdo/pelvis on 12/02/14 showed no abscess or apparent fistula. 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 The patient was taken back to the OR on 12/04/14 for laparoscopic colostomy revision and repositioning in the upper abdomen. The prior colostomy site was kept open at the subQ with wet to dry dressings. A wound vac was applied on POD 2.   Review of systems: Constitutional: She has no weight gain or weight loss. She has a low grade fever no chills. Eyes: No blurred vision Ears, Nose, Mouth, Throat: No dizziness, headaches or changes in hearing. No mouth sores. Cardiovascular: No chest pain, palpitations or edema. Respiratory: No shortness of breath, wheezing or cough Gastrointestinal: She has normal bowel movements trhough stoma. She denies any nausea or vomiting. She complains of cramping with meals Genitourinary: She denies pelvic pain, pelvic pressure or changes in her urinary function. She has no hematuria, dysuria, or incontinence (foley in situ). She has no irregular vaginal bleeding or vaginal discharge Musculoskeletal: Denies muscle weakness or joint pains.  Skin: She has no skin changes, rashes or itching Neurological: Denies dizziness or headaches. No neuropathy, no numbness or tingling. Psychiatric: She c/o less depressed mood today Hematologic/Lymphatic: No easy bruising or bleeding  Allergies  Allergen Reactions  . Penicillins Swelling    Facial swelling  . Ultram [Tramadol] Hives  . Adhesive [Tape]     blisters  . Cefaclor Rash    Ceclor  . Erythromycin      gastritis  . Trimethoprim Rash  .  Sulfa Antibiotics Rash    Current Facility-Administered Medications on File Prior to Encounter  Medication Dose Route Frequency Provider Last Rate Last Dose  . Tbo-Filgrastim (GRANIX) injection 480 mcg 480 mcg Subcutaneous Weekly Heath Lark, MD  480 mcg at 11/29/14 1338   Current Outpatient Prescriptions on File Prior to Encounter  Medication Sig Dispense Refill  . acetaminophen (TYLENOL) 500 MG tablet Take 2 tablets (1,000 mg total) by mouth 3 (three) times daily. 30 tablet 0  . Calcium Carbonate-Vitamin D (CALCIUM + D PO) Take 2 tablets by mouth daily.    . Canagliflozin (INVOKANA) 300 MG TABS Take 1 tablet by mouth every morning.     . Cholecalciferol (VITAMIN D3) 10000 UNITS capsule Take 10,000 Units by mouth once a week. Sundays    . docusate sodium (COLACE) 100 MG capsule Take 100 mg by mouth daily.    [name] Kitchen enoxaparin (LOVENOX) 40 MG/0.4ML injection Inject 0.4 mLs (40 mg total) into the skin daily. 21 Syringe 1  . ferrous sulfate 325 (65 FE) MG tablet Take 1 tablet (325 mg total) by mouth 2 (two) times daily with a meal. 60 tablet 3  . hydrochlorothiazide (HYDRODIURIL) 12.5 MG tablet Take 12.5 mg by mouth every morning.     [name] Kitchen ibuprofen (ADVIL,MOTRIN) 800 MG tablet     . levothyroxine (SYNTHROID) 175 MCG tablet Take 175 mcg by mouth daily before breakfast.    . metFORMIN (GLUCOPHAGE) 1000 MG tablet Take 1,000 mg by mouth 2 (two) times daily with a meal.     . Multiple Vitamin (MULTIVITAMIN WITH MINERALS) TABS tablet Take 1 tablet by mouth daily.    [name] Kitchen omega-3 acid ethyl esters (LOVAZA) 1 G capsule Take 1 g by mouth 2 (two) times daily.    [name] Kitchen omeprazole (PRILOSEC) 20 MG capsule Take 20 mg by mouth daily.    . ondansetron (ZOFRAN) 4 MG tablet Take 1 tablet (4 mg total) by mouth every 6 (six) hours as needed for nausea. 20 tablet 0  . ONETOUCH  VERIO test strip     . oxyCODONE (OXY IR/ROXICODONE) 5 MG immediate release tablet Take 1-2 tablets (5-10 mg total) by mouth every 4 (four) hours as needed for moderate pain, severe pain or breakthrough pain. 30 tablet 0  . Polyethyl Glycol-Propyl Glycol (SYSTANE OP) Apply 1 drop to eye daily as needed (Dry eyes).     [name] Kitchen PROVENTIL HFA 108 (90 BASE) MCG/ACT inhaler   0  . rosuvastatin (CRESTOR) 10 MG tablet Take 10 mg by mouth every evening.     . sitaGLIPtin (JANUVIA) 100 MG tablet Take 100 mg by mouth daily.    . Tbo-Filgrastim (GRANIX) 480 MCG/0.8ML SOSY injection Inject 480 mcg into the skin once a week.    . traMADol (ULTRAM) 50 MG tablet   0  . valsartan (DIOVAN) 320 MG tablet Take 320 mg by mouth every morning.      Past Medical History  Diagnosis Date  . Hypothyroidism 12/14/2011  . Benign essential HTN 12/14/2011  . Neutropenia   . Postmenopausal bleeding 09/05/2014  . Anemia     Planned Neupogen injections for chronic neutropenia  . Transfusion history     '06-s/p hysterectomy and staging  . Multiple thyroid nodules y-8    Managed by Dr. Harlow Asa  . GERD (gastroesophageal reflux disease)   . Polyp of duodenum y-2  . DM type 2 (diabetes mellitus, type 2) 12/14/2011  . Dysrhythmia     skipped beat with occasional coffee   .  Hx of bronchitis   . Depression   . Hiatal hernia y-12    hx of small hiatal hernia   . Neutropenia     chronic   . Cancer     '06-Ovarian/ Uterine Cancer-chemotherapy  . 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 Appendectomy    . Tonsillectomy    . Cholecystectomy      laparoscopic  . Eye surgery      pytosis of eyelids-child  . Colonoscopy with propofol N/A 08/21/2013    Procedure: COLONOSCOPY WITH PROPOFOL; Surgeon: Cleotis Nipper, MD;  Location: [facility]; Service: Endoscopy; Laterality: N/A;  . Abdominal hysterectomy      staging for Ovarian cancer  . Eus N/A 10/02/2014    Procedure: LOWER ENDOSCOPIC ULTRASOUND (EUS); Surgeon: Arta Silence, MD; Location: [facility]; Service: Endoscopy; Laterality: N/A;  . Robotic assisted lap vaginal hysterectomy N/A 11/19/2014    Procedure: ROBOTIC LYSIS OF ADHESIONS, CONVERTED TO LAPAROTOMY RADICAL UPPER VAGINECTOMY,LOW ANTERIOR BOWEL RESECTION, COLOSTOMY, BILATERAL URETERAL STENT PLACEMENT AND CYSTONOMY CLOSURE; Surgeon: Everitt Amber, MD; Location: [facility]; Service: Gynecology; Laterality: N/A;  . Ostomy N/A 11/19/2014    Procedure: OSTOMY; Surgeon: Michael [name], MD; Location: [facility]; Service: General; Laterality: N/A;    Family History  Problem Relation Age of Onset  . Cancer Mother     stomach ca  . Hypertension Mother   . Cancer Father     prostate ca  . Diabetes Father   . Diabetes Sister   . Hypertension Brother y-10    History   Social History  . Marital Status: Married    Spouse Name: N/A  . Number of Children: N/A  . Years of Education: N/A   Occupational History  . Not on file.   Social History Main Topics  . Smoking status: Never Smoker   . Smokeless tobacco: Never Used  . Alcohol Use: Yes     Comment: rare social  . Drug Use: No  . Sexual Activity: Not Currently   Other Topics Concern  . Not on file   Social History Narrative   CBC    Component Value Date/Time   WBC 6.0 12/10/2014 0525   WBC 2.1* 11/29/2014 1251   WBC 2.4* 08/25/2014 1541   RBC 3.32* 12/10/2014 0525   RBC 2.66* 11/29/2014 1251   RBC 4.75 08/25/2014 1541   HGB 8.9* 12/10/2014 0525   HGB 7.2* 11/29/2014 1251   HGB 13.0 08/25/2014 1541   HCT 28.9* 12/10/2014 0525   HCT 23.4* 11/29/2014 1251   HCT 41.3 08/25/2014 1541   PLT 335 12/10/2014 0525    PLT 548* 11/29/2014 1251   MCV 87.0 12/10/2014 0525   MCV 88.0 11/29/2014 1251   MCV 86.8 08/25/2014 1541   MCH 26.8 12/10/2014 0525   MCH 27.1 11/29/2014 1251   MCH 27.5 08/25/2014 1541   MCHC 30.8 12/10/2014 0525   MCHC 30.8* 11/29/2014 1251   MCHC 31.6* 08/25/2014 1541   RDW 16.3* 12/10/2014 0525   RDW 17.5* 11/29/2014 1251   LYMPHSABS 1.0 12/10/2014 0525   LYMPHSABS 0.6* 11/29/2014 1251   MONOABS 0.7 12/10/2014 0525   MONOABS 0.8 11/29/2014 1251   EOSABS 0.6 12/10/2014 0525   EOSABS 0.2 11/29/2014 1251   BASOSABS 0.0 12/10/2014 0525   BASOSABS 0.0 11/29/2014 1251    BMET    Component Value Date/Time   NA 135 12/09/2014 0600   NA 140 11/29/2014 1251   K  2.3* 12/09/2014 0600   K 3.5 11/29/2014 1251   CL 97* 12/09/2014 0600   CO2 31 12/09/2014 0600   CO2 30* 11/29/2014 1251   GLUCOSE 166* 12/09/2014 0600   GLUCOSE 168* 11/29/2014 1251   BUN 7 12/09/2014 0600   BUN 11.2 11/29/2014 1251   CREATININE 0.59 12/09/2014 0600   CREATININE 0.7 11/29/2014 1251   CREATININE 0.84 08/25/2014 1538   CALCIUM 8.1* 12/09/2014 0600   CALCIUM 9.3 11/29/2014 1251   GFRNONAA >60 12/09/2014 0600   GFRNONAA 76 08/25/2014 1538   GFRAA >60 12/09/2014 0600   GFRAA 87 08/25/2014 1538      Physical Exam: Blood pressure 136/60, pulse 94, temperature 98.2 F (36.8 C), temperature source Oral, resp. rate 18, height 5\' 2"  (1.575 m), weight 252 lb 12.8 oz (114.669 kg). General: Well dressed, well nourished in no apparent distress.  HEENT: Normocephalic and atraumatic, no lesions. Extraocular muscles intact. Sclerae anicteric. Pupils equal, round, reactive. No mouth sores or ulcers. Thyroid is normal size, not nodular, midline. Skin: No lesions or rashes. Breasts: deferred Lungs: Clear to auscultation bilaterally. No wheezes. Cardiovascular: Regular rate and rhythm. No murmurs or rubs. Abdomen: Soft, nontender, nondistended. No palpable masses. No hepatosplenomegaly. No  ascites. Normal bowel sounds. No hernias. Midline incision is healing with no drainage or erythema. No drainage. Laparoscopic incisions are clean and dry.  Wound vac in place. Gas in bag. Genitourinary: deferred Extremities: No cyanosis, clubbing. LLE edema (L>R). No calf tenderness or erythema. No palpable cords. Psychiatric: Mood and affect are appropriate. Neurological: Awake, alert and oriented x 3. Sensation is intact, no neuropathy.  Musculoskeletal: No pain, normal strength and range of motion.  Donaciano Eva       Routing History

## 2014-12-10 NOTE — Progress Notes (Signed)
[facility]  [address] [place], [place] [address] Phone: [phone] FAX: [phone]    [name] [id number] [birth year]  CARE TEAM:  PCP: Leana Gamer., MD  Outpatient Care Team: Patient Care Team: Leana Gamer, MD as PCP - General (Internal Medicine) [name], MD as Referring Physician (Obstetrics and Gynecology) Heath Lark, MD as Consulting Physician (Hematology and Oncology) Everitt Amber, MD as Consulting Physician (Obstetrics and Gynecology) Michael [name], MD as Consulting Physician (General Surgery)  Inpatient Treatment Team: Treatment Team: Attending Provider: Everitt Amber, MD; Consulting Physician: Michael [name], MD; Respiratory Therapist: Lita Mains, RRT; Physician Assistant: Rondel Jumbo, PA-C; Consulting Physician: Heath Lark, MD; Registered Nurse: Nadara Mode, RN; Nurse Practitioner: Dorothyann Gibbs, NP; Registered Nurse: Curlene Labrum, RN; Technician: Ileene Rubens, NT; Registered Nurse: Lupita Shutter., RN; Registered Nurse: Carilyn Goodpasture, RN; Technician: Carolyn Stare, NT; Technician: Lucas Mallow, NT; Registered Nurse: Dellie Burns, RN  Problem List:   Principal Problem:   Cellulitis of left abdominal wall near colostomy Active Problems:   Chronic neutropenia   6 Days Post-Op    12/04/2014 POST-OPERATIVE DIAGNOSIS:   Abdominal wall abscess secondary to colostomy perforation  PROCEDURE: Procedure(s): LAPROSCOPIC LYSIS OF ADHESIONS Laparoscopic mobilization of the splenic flexure RELOCATION OF COLOSTOMY DEBRIDEMENT INITIAL COLOSTOMY SITE  SURGEON: Surgeon(s): Michael [name], MD     PRIOR Procedure(s) 11/19/2014 POST-OPERATIVE DIAGNOSIS: 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 TWO LYMPH NODES, NEGATIVE FOR ATYPIA OR MALIGNANCY (0/2). RESECTION MARGINS, NEGATIVE FOR ATYPIA OR MALIGNANCY.  Microscopic Comment Please see [name] description for details. [name] Pathologist, Electronic Signature (Case signed 11/21/2014) Specimen [name] and Clinical Information Specimen(s) Obtained: Colon, segmental resection for tumor, rectosigmoid colon with upper vagina Specimen Clinical Information endometrial cancer (kp) [name] The specimen is received in formalin labeled rectosigmoid colon and upper vagina, and consists of an 18.1 cm in length portion of colon, with the proximal margin stapled and the distal margin open. On the anterior aspect of the colon, there is a 2.7 x 2.6 cm slightly disrupted circular portion of gray-brown mucosa designated by a stitch, which is clinically stated as posterior vaginal wall. The resection margin surrounding the vaginal tissue is inked black, and the distal resection margin is inked black for orientation. The soft tissue surrounding the vaginal tissue along the right side is slightly firm. The lumen of the colon contains a small amount of green-brown fecal material, and the mucosa is tan-pink with normal folding. No mucosal lesions are grossly identified, however, palpating the colon, which overlies the indurated adipose tissue reveals a firm nodule within the possible submucosa and muscularis. The nodule measures approximately 0.6 cm, and is 2.9 cm from the distal resection margin. Sectioning through the distal aspect of the colon and underlying soft tissue reveals a  4.6 x 4.5 x 4.0 cm tan-red, hemorrhagic, softened lesion within the 1 of 2 FINAL for [name], [name] ([id number]) [name](continued) underlying adipose tissue. The lesion invades into the overlying muscularis propria at the area previously noted. The colon wall measures 0.5 cm in  thickness. Three tan-pink possible lymph nodes are identified, ranging from 0.2 cm to 0.5 cm in greatest dimension. Please note the patient is status post chemotherapy. Representative sections are submitted in twelve cassettes. A = proximal resection margin. B, C = distal resection margin. D - F = lesion to overlying colon. G, H = lesion. I, J = vaginal mucosa. K = uninvolved mucosa. L = three possible lymph nodes. (KL:ecj 11/20/2014) Report signed out from the following location(s) Technical component and interpretation was performed at [place]  Cellulitis with abscess lateral to colostomy with stool due to absominal wall perforation - improved  Ileus with constipation - resolving  Plan:  Adv diet PO - full liquids  IV ABx.  Vanco/Clindamycin with ### ABx allergies.    Dressing changes to old colostomy site.  Wound vac MWF.  No major purulence/necrosis.   Miralax bowel regimen to help clean her out - simplify & use that only  Anemia.  Hgb 9s s/p intraop transfusion - follow.  Transfuse if Hgb <7 and symptomatic if GynOnc Dr Rossi/LJM agree.   Less likely now.  ?Reinvolve Hematology/oncology - CSF to help stim BMarrow?  Pt asymptomatic.    Low K  - replacing again - check Mg again  Pain control -  Continue tylenol RTC.  PO oxycodone w IV Dilaudid backup.  Wean off PCA when patient ready.  She claims that she is ready today, so let her decide  Foley - DO NOT REMOVE due to bladder repair & ureteral stenting.  She had complex cystotomy repair (trigone and base) and ureteral stenting. Will perform retrograde cystogram in 12May will d.c foley at that time IF no leak noted.  Probably this admission.     Remove ureteral stents at 6 weeks if no stricture/leak on CT urogram.  DRAIN Removed 12/04/2014.  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 TWO LYMPH NODES, NEGATIVE FOR ATYPIA OR MALIGNANCY (0/2). I am skeptical that only 2 LN were found in 18cm of intact mesorectum & mesocolon although not a colon adenoCA = may not be usually drainage point for the recurrent endometriod CA  RESECTION MARGINS, NEGATIVE FOR ATYPIA OR MALIGNANCY.  Glc control - SSI.  PO hypoglycemics.  Remove D5 from IVF  HTN - OK off meds - restart gradually.  GERD - protonix daily  -VTE prophylaxis- SCDs, etc  -mobilize as tolerated to help recovery   I updated the patient's status to the patient & her husband.   Recommendations were made.  Questions were answered.  They expressed understanding & appreciation.   [name], M.D., F.A.C.S. Gastrointestinal and Minimally Invasive Surgery [facility] [address] [place], [place] [address] [phone] Main / Paging   12/10/2014  Subjective:  3W onc floor Soreness down - not needed PCA much but wanted to keep it yesterday No n/v.  Tolerating liquids PO Less abdominal cramping with ##flatus  Objective:  Vital signs:  Filed Vitals:   12/10/14 0400 12/10/14 0438 12/10/14 0647 12/10/14 0658  BP:  126/42    Pulse:  73    Temp:  97.8 F (36.6 C)    TempSrc:  Oral    Resp: _0 Height:      Weight:   116.212 kg (256 lb 3.2 oz)   SpO2: 97% 94%  97%  Last BM Date: 12/09/14  Intake/Output   Yesterday:  05/09 0701 - 05/10 0700 In: 900.7 [P.O.:360; I.V.:186.7; IV Piggyback:354] Out: 3100 [Urine:2100; Stool:1000] This shift:     Bowel function:  Flatus: YES  BM: Thick stool  Drain: n/a  Physical Exam:  General: Pt awake/alert/oriented in no acute  distress.  Tired but better Eyes: PERRL, normal EOM.  Sclera clear.  No icterus Neuro: CN II-XII intact w/o focal sensory/motor deficits. Lymph: No head/neck/groin lymphadenopathy Psych:  No delerium/psychosis/paranoia HENT: Normocephalic, Mucus membranes moist.  No thrush.   Neck: Supple, No tracheal deviation Chest: No chest wall pain w good excursion CV:  Pulses intact.  Regular rhythm MS: Normal AROM mjr joints.  No obvious deformity Abdomen: Soft.  Nondistended.  Incision closed.   LLQ old colostomy site no induration nor erythema.  Improved.   No evidence of peritonitis.  No incarcerated hernias.  New LUQ colostomy pink w edema - mild lateral induration Ext:  SCDs BLE.  No mjr edema.  No cyanosis Skin: No petechiae / purpura  Results:   Labs: Results for orders placed or performed during the hospital encounter of 12/02/14 (from the past 48 hour(s))  Glucose, capillary     Status: Abnormal   Collection Time: 12/08/14 11:48 AM  Result Value Ref Range   Glucose-Capillary 129 (H) 70 - 99 mg/dL   Comment 1 Notify RN    Comment 2 Document in Chart   Glucose, capillary     Status: Abnormal   Collection Time: 12/08/14  5:04 PM  Result Value Ref Range   Glucose-Capillary 134 (H) 70 - 99 mg/dL  Glucose, capillary     Status: Abnormal   Collection Time: 12/08/14  9:54 PM  Result Value Ref Range   Glucose-Capillary 173 (H) 70 - 99 mg/dL   Comment 1 Notify RN    Comment 2 Document in Chart   Magnesium     Status: Abnormal   Collection Time: 12/09/14  5:00 AM  Result Value Ref Range   Magnesium 1.5 (L) 1.7 - 2.4 mg/dL  Basic metabolic panel     Status: Abnormal   Collection Time: 12/09/14  6:00 AM  Result Value Ref Range   Sodium 135 135 - 145 mmol/L   Potassium 2.3 (LL) 3.5 - 5.1 mmol/L    Comment: REPEATED TO VERIFY CRITICAL RESULT CALLED TO, READ BACK BY AND VERIFIED WITH: J.MAONFELT,RN AT 9924 ON 12/09/14 BY W.SHEA    Chloride 97 (L) 101 - 111 mmol/L   CO2 31 22 - 32 mmol/L    Glucose, Bld 166 (H) 70 - 99 mg/dL   BUN 7 6 - 20 mg/dL   Creatinine, Ser 0.59 0.44 - 1.00 mg/dL   Calcium 8.1 (L) 8.9 - 10.3 mg/dL   GFR calc non Af Amer >60 >60 mL/min   GFR calc Af Amer >60 >60 mL/min    Comment: (NOTE) The eGFR has been calculated using the CKD EPI equation. This calculation has not been validated in all clinical situations. eGFR's persistently <60 mL/min signify possible Chronic Kidney Disease.    Anion gap 7 5 - 15  CBC with Differential     Status: Abnormal   Collection Time: 12/09/14  6:00 AM  Result Value Ref Range   WBC 9.4 4.0 - 10.5 K/uL   RBC 3.46 (L) 3.87 - 5.11 MIL/uL   Hemoglobin 9.2 (L) 12.0 - 15.0 g/dL   HCT 29.7 (L) 36.0 - 46.0 %   MCV 85.8 78.0 - 100.0  fL   MCH 26.6 26.0 - 34.0 pg   MCHC 31.0 30.0 - 36.0 g/dL   RDW 16.5 (H) 11.5 - 15.5 %   Platelets 369 150 - 400 K/uL   Neutrophils Relative % 78 (H) 43 - 77 %   Lymphocytes Relative 12 12 - 46 %   Monocytes Relative 7 3 - 12 %   Eosinophils Relative 3 0 - 5 %   Basophils Relative 0 0 - 1 %   Neutro Abs 7.3 1.7 - 7.7 K/uL   Lymphs Abs 1.1 0.7 - 4.0 K/uL   Monocytes Absolute 0.7 0.1 - 1.0 K/uL   Eosinophils Absolute 0.3 0.0 - 0.7 K/uL   Basophils Absolute 0.0 0.0 - 0.1 K/uL   Smear Review MORPHOLOGY UNREMARKABLE   Vancomycin, trough     Status: None   Collection Time: 12/09/14  6:00 AM  Result Value Ref Range   Vancomycin Tr 11 10.0 - 20.0 ug/mL  Glucose, capillary     Status: Abnormal   Collection Time: 12/09/14  7:42 AM  Result Value Ref Range   Glucose-Capillary 176 (H) 70 - 99 mg/dL  Glucose, capillary     Status: Abnormal   Collection Time: 12/09/14 12:34 PM  Result Value Ref Range   Glucose-Capillary 154 (H) 70 - 99 mg/dL  Glucose, capillary     Status: None   Collection Time: 12/09/14  5:16 PM  Result Value Ref Range   Glucose-Capillary 99 70 - 99 mg/dL   Comment 1 Notify RN    Comment 2 Document in Chart   CBC with Differential     Status: Abnormal   Collection Time:  12/10/14  5:25 AM  Result Value Ref Range   WBC 6.0 4.0 - 10.5 K/uL   RBC 3.32 (L) 3.87 - 5.11 MIL/uL   Hemoglobin 8.9 (L) 12.0 - 15.0 g/dL   HCT 28.9 (L) 36.0 - 46.0 %   MCV 87.0 78.0 - 100.0 fL   MCH 26.8 26.0 - 34.0 pg   MCHC 30.8 30.0 - 36.0 g/dL   RDW 16.3 (H) 11.5 - 15.5 %   Platelets 335 150 - 400 K/uL   Neutrophils Relative % 62 43 - 77 %   Neutro Abs 3.8 1.7 - 7.7 K/uL   Lymphocytes Relative 16 12 - 46 %   Lymphs Abs 1.0 0.7 - 4.0 K/uL   Monocytes Relative 12 3 - 12 %   Monocytes Absolute 0.7 0.1 - 1.0 K/uL   Eosinophils Relative 10 (H) 0 - 5 %   Eosinophils Absolute 0.6 0.0 - 0.7 K/uL   Basophils Relative 0 0 - 1 %   Basophils Absolute 0.0 0.0 - 0.1 K/uL    Imaging / Studies: No results found.  Medications / Allergies: per chart  Antibiotics: Anti-infectives    Start     Dose/Rate Route Frequency Ordered Stop   12/09/14 1200  vancomycin (VANCOCIN) 1,250 mg in sodium chloride 0.9 % 250 mL IVPB     1,250 mg 166.7 mL/hr over 90 Minutes Intravenous Every 12 hours 12/09/14 0833     12/05/14 0600  gentamicin (GARAMYCIN) IVPB 100 mg  Status:  Discontinued    Comments:  Pharmacy may adjust dosing strength, schedule, rate of infusion, etc as needed to optimize therapy Send with patient on call to the OR.  Anesthesia to complete antibiotic administration <27mn [facility]   100 mg 200 mL/hr over 30 Minutes Intravenous On call to O.R. 12/04/14 1346  12/04/14 1352   12/04/14 1400  gentamicin (GARAMYCIN) 380 mg in dextrose 5 % 100 mL IVPB     5 mg/kg  75.4 kg (Adjusted) 109.5 mL/hr over 60 Minutes Intravenous  Once 12/04/14 1349 12/04/14 1430   12/03/14 0600  vancomycin (VANCOCIN) IVPB 1000 mg/200 mL premix  Status:  Discontinued     1,000 mg 200 mL/hr over 60 Minutes Intravenous Every 12 hours 12/02/14 1817 12/09/14 0832   12/02/14 1930  clindamycin (CLEOCIN) IVPB 600 mg     600 mg 100 mL/hr over 30 Minutes Intravenous 4 times per day 12/02/14  1847     12/02/14 1830  metroNIDAZOLE (FLAGYL) IVPB 500 mg  Status:  Discontinued     500 mg 100 mL/hr over 60 Minutes Intravenous Every 8 hours 12/02/14 1816 12/02/14 1847   12/02/14 1815  vancomycin (VANCOCIN) 2,000 mg in sodium chloride 0.9 % 500 mL IVPB     2,000 mg 250 mL/hr over 120 Minutes Intravenous  Once 12/02/14 1814 12/02/14 2249   12/02/14 1815  ciprofloxacin (CIPRO) IVPB 400 mg  Status:  Discontinued     400 mg 200 mL/hr over 60 Minutes Intravenous Every 12 hours 12/02/14 1816 12/02/14 1847   12/02/14 1800  metroNIDAZOLE (FLAGYL) IVPB 500 mg  Status:  Discontinued     500 mg 100 mL/hr over 60 Minutes Intravenous Every 8 hours 12/02/14 1649 12/02/14 1733   12/02/14 1700  ciprofloxacin (CIPRO) IVPB 200 mg  Status:  Discontinued     200 mg 100 mL/hr over 60 Minutes Intravenous Every 12 hours 12/02/14 1646 12/02/14 1733       Note: Portions of this report may have been transcribed using voice recognition software. Every effort was made to ensure accuracy; however, inadvertent computerized transcription errors may be present.   Any transcriptional errors that result from this process are unintentional.     [name], M.D., F.A.C.S. Gastrointestinal and Minimally Invasive Surgery [facility] [address] [place], [place] [address] [phone] Main / Paging   12/10/2014

## 2014-12-10 NOTE — Consult Note (Addendum)
WOC follow-up: Colostomy pouch intact with good seal, mod amt liquid brown stool in pouch.  Vac intact with good seal; due for dressing change on Wed.  Pt denies further questions at this time and states she is feeling much better.  Previous cellulitis to left lower abd has resolved.  Supplies at bedside for patient and staff use. [place] team will continue to follow for wound and ostomy assistance. Julien Girt MSN, RN, [place], [place], [place]

## 2014-12-10 NOTE — Care Management Note (Signed)
Case Management Note  Patient Details  Name: [name] MRN: [id number] Date of Birth: 1956-05-12  Per [employer] rep home VAC will be delivered to hospital today. CM will continue to follow.  Lynnell Catalan, RN 12/10/2014, 3:04 PM

## 2014-12-11 ENCOUNTER — Other Ambulatory Visit: Payer: BLUE CROSS/BLUE SHIELD | Admitting: Surgery

## 2014-12-11 ENCOUNTER — Inpatient Hospital Stay (HOSPITAL_COMMUNITY): Payer: BLUE CROSS/BLUE SHIELD

## 2014-12-11 DIAGNOSIS — E876 Hypokalemia: Secondary | ICD-10-CM

## 2014-12-11 LAB — BASIC METABOLIC PANEL
Anion gap: 6 (ref 5–15)
BUN: 9 mg/dL (ref 6–20)
CO2: 30 mmol/L (ref 22–32)
CREATININE: 0.66 mg/dL (ref 0.44–1.00)
Calcium: 8.3 mg/dL — ABNORMAL LOW (ref 8.9–10.3)
Chloride: 102 mmol/L (ref 101–111)
GFR calc non Af Amer: >60 mL/min (ref >60)
Glucose, Bld: 115 mg/dL — ABNORMAL HIGH (ref 70–99)
Potassium: 3.1 mmol/L — ABNORMAL LOW (ref 3.5–5.1)
Sodium: 138 mmol/L (ref 135–145)

## 2014-12-11 LAB — CBC WITH DIFFERENTIAL/PLATELET
BASOS ABS: 0 10*3/uL (ref 0.0–0.1)
Basophils Relative: 0 % (ref 0–1)
EOS ABS: 0.5 10*3/uL (ref 0.0–0.7)
EOS PCT: 9 % — AB (ref 0–5)
HCT: 27.4 % — ABNORMAL LOW (ref 36.0–46.0)
Hemoglobin: 8.5 g/dL — ABNORMAL LOW (ref 12.0–15.0)
LYMPHS PCT: 23 % (ref 12–46)
Lymphs Abs: 1.2 10*3/uL (ref 0.7–4.0)
MCH: 26.8 pg (ref 26.0–34.0)
MCHC: 31 g/dL (ref 30.0–36.0)
MCV: 86.4 fL (ref 78.0–100.0)
MONO ABS: 0.6 10*3/uL (ref 0.1–1.0)
Monocytes Relative: 12 % (ref 3–12)
Neutro Abs: 2.9 10*3/uL (ref 1.7–7.7)
Neutrophils Relative %: 56 % (ref 43–77)
Platelets: 320 10*3/uL (ref 150–400)
RBC: 3.17 MIL/uL — ABNORMAL LOW (ref 3.87–5.11)
RDW: 16.2 % — ABNORMAL HIGH (ref 11.5–15.5)
WBC: 5.1 10*3/uL (ref 4.0–10.5)

## 2014-12-11 LAB — URINE CULTURE
Colony Count: NO GROWTH
Culture: NO GROWTH
Special Requests: NORMAL

## 2014-12-11 LAB — GLUCOSE, CAPILLARY
Glucose-Capillary: 144 mg/dL — ABNORMAL HIGH (ref 70–99)
Glucose-Capillary: 170 mg/dL — ABNORMAL HIGH (ref 70–99)
Glucose-Capillary: 119 mg/dL — ABNORMAL HIGH (ref 70–99)
Glucose-Capillary: 131 mg/dL — ABNORMAL HIGH (ref 70–99)

## 2014-12-11 LAB — MAGNESIUM: Magnesium: 1.3 mg/dL — ABNORMAL LOW (ref 1.7–2.4)

## 2014-12-11 MED ORDER — MAGNESIUM SULFATE 4 GM/100ML IV SOLN
4.0000 g | Freq: Once | INTRAVENOUS | Status: AC
  Administered 2014-12-11: 4 g via INTRAVENOUS
  Filled 2014-12-11: qty 100

## 2014-12-11 MED ORDER — MAGNESIUM OXIDE 400 (241.3 MG) MG PO TABS
400.0000 mg | ORAL_TABLET | Freq: Two times a day (BID) | ORAL | Status: DC
  Administered 2014-12-11 – 2014-12-12 (×3): 400 mg via ORAL
  Filled 2014-12-11 (×4): qty 1

## 2014-12-11 MED ORDER — HYDROMORPHONE HCL 1 MG/ML IJ SOLN
0.5000 mg | INTRAMUSCULAR | Status: DC | PRN
  Administered 2014-12-11: 2 mg via INTRAVENOUS
  Filled 2014-12-11: qty 2

## 2014-12-11 MED ORDER — DIATRIZOATE MEGLUMINE 30 % UR SOLN: Freq: Once | URETHRAL | Status: AC | PRN

## 2014-12-11 NOTE — Progress Notes (Signed)
[facility]  [address] [place], [place] [address] Phone: [phone] FAX: [phone]    [name] [id number] Jul 10, 1956  CARE TEAM:  PCP: Leana Gamer., MD  Outpatient Care Team: Patient Care Team: Leana Gamer, MD as PCP - General (Internal Medicine) [name], MD as Referring Physician (Obstetrics and Gynecology) Heath Lark, MD as Consulting Physician (Hematology and Oncology) Everitt Amber, MD as Consulting Physician (Obstetrics and Gynecology) Michael [name], MD as Consulting Physician (General Surgery)  Inpatient Treatment Team: Treatment Team: Attending Provider: Everitt Amber, MD; Consulting Physician: Michael [name], MD; Respiratory Therapist: Lita Mains, RRT; Physician Assistant: Rondel Jumbo, PA-C; Consulting Physician: Heath Lark, MD; Registered Nurse: Nadara Mode, RN; Nurse Practitioner: Dorothyann Gibbs, NP; Registered Nurse: Curlene Labrum, RN; Technician: Ileene Rubens, NT; Registered Nurse: Lupita Shutter., RN; Registered Nurse: Carilyn Goodpasture, RN; Registered Nurse: Dellie Burns, RN; Registered Nurse: Elza Rafter, RN  Problem List:   Principal Problem:   Cellulitis of left abdominal wall near colostomy Active Problems:   Chronic neutropenia   Hypomagnesemia   Hypokalemia   7 Days Post-Op    12/04/2014 POST-OPERATIVE DIAGNOSIS:   Abdominal wall abscess secondary to colostomy perforation  PROCEDURE: Procedure(s): LAPROSCOPIC LYSIS OF ADHESIONS Laparoscopic mobilization of the splenic flexure RELOCATION OF COLOSTOMY DEBRIDEMENT INITIAL COLOSTOMY SITE  SURGEON: Surgeon(s): Michael [name], MD     PRIOR Procedure(s) 11/19/2014 POST-OPERATIVE DIAGNOSIS: 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 TWO LYMPH NODES, NEGATIVE FOR ATYPIA OR MALIGNANCY (0/2). RESECTION MARGINS, NEGATIVE FOR ATYPIA OR MALIGNANCY.  Microscopic Comment Please see [name] description for details. [name] Pathologist, Electronic Signature (Case signed 11/21/2014) Specimen [name] and Clinical Information Specimen(s) Obtained: Colon, segmental resection for tumor, rectosigmoid colon with upper vagina Specimen Clinical Information endometrial cancer (kp) [name] The specimen is received in formalin labeled rectosigmoid colon and upper vagina, and consists of an 18.1 cm in length portion of colon, with the proximal margin stapled and the distal margin open. On the anterior aspect of the colon, there is a 2.7 x 2.6 cm slightly disrupted circular portion of gray-brown mucosa designated by a stitch, which is clinically stated as posterior vaginal wall. The resection margin surrounding the vaginal tissue is inked black, and the distal resection margin is inked black for orientation. The soft tissue surrounding the vaginal tissue along the right side is slightly firm. The lumen of the colon contains a small amount of green-brown fecal material, and the mucosa is tan-pink with normal folding. No mucosal lesions are grossly identified, however, palpating the colon, which overlies the indurated adipose tissue reveals a firm nodule within the possible submucosa and muscularis. The nodule measures approximately 0.6 cm, and is 2.9 cm from the distal resection margin. Sectioning through the distal aspect of the colon and underlying soft  tissue reveals a 4.6 x 4.5 x 4.0 cm tan-red, hemorrhagic, softened lesion within the 1 of 2 FINAL for [name], [name] ([id number]) [name](continued) underlying adipose tissue. The lesion invades into the overlying muscularis propria at the area previously noted. The colon wall measures  0.5 cm in thickness. Three tan-pink possible lymph nodes are identified, ranging from 0.2 cm to 0.5 cm in greatest dimension. Please note the patient is status post chemotherapy. Representative sections are submitted in twelve cassettes. A = proximal resection margin. B, C = distal resection margin. D - F = lesion to overlying colon. G, H = lesion. I, J = vaginal mucosa. K = uninvolved mucosa. L = three possible lymph nodes. (KL:ecj 11/20/2014) Report signed out from the following location(s) Technical component and interpretation was performed at [facility]  Cellulitis with abscess lateral to colostomy with stool due to absominal wall perforation - improved  Ileus with constipation - resolving  Plan:  Adv diet PO - solid diet liquids  Stop IV ABx with no more cellulitis & POD#7.  es.    Dressing changes to old colostomy site.  Wound vac MWF.  No major purulence/necrosis.   Miralax bowel regimen to help clean her out - simplify & use that only  Anemia.  Hgb 8s s/p intraop transfusion - follow.  Transfuse if Hgb <7 and symptomatic if GynOnc Dr Rossi/LJM agree.   Less likely now.  ?Reinvolve Hematology/oncology - CSF to help stim BMarrow?  Pt asymptomatic.    HypoMag & HypoK  - replacing again - check Mg in urine.  Most likely postoperative transition given normokalemia in 2010/2012.  Pain control -  Continue tylenol RTC.  PO oxycodone w IV Dilaudid backup.    Foley - DO NOT REMOVE due to bladder repair & ureteral stenting.  She had complex cystotomy repair (trigone and base) and ureteral stenting. Will perform retrograde cystogram in 12May will d.c foley at that time IF no leak noted.   Probably this admission.    Remove ureteral stents at 6 weeks if no stricture/leak on CT urogram.  DRAIN Removed 12/04/2014.  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 TWO LYMPH NODES, NEGATIVE FOR ATYPIA OR MALIGNANCY (0/2). I am skeptical that only 2 LN were found in 18cm of intact mesorectum & mesocolon although not a colon adenoCA = may not be usually drainage point for the recurrent endometriod CA  RESECTION MARGINS, NEGATIVE FOR ATYPIA OR MALIGNANCY.  Glc control - SSI.  PO hypoglycemics.  Remove D5 from IVF  HTN - OK off meds - restart gradually.  GERD - protonix daily  -VTE prophylaxis- SCDs, etc  -mobilize as tolerated to help recovery   I updated the patient's status to the patient & her husband.   D/e Dr Lahoma Crocker.  Recommendations were made.  Questions were answered.  They expressed understanding & appreciation.   [name], M.D., F.A.C.S. Gastrointestinal and Minimally Invasive Surgery [facility] [address] [place], [place] [address] [phone] Main / Paging   12/11/2014  Subjective:  3W onc floor Soreness down - not needed PCA much but wanted to keep it yesterday No n/v.  Tolerating liquids PO Less abdominal cramping with ##flatus  Objective:  Vital signs:  Filed Vitals:   12/10/14 0647 12/10/14 0658 12/10/14 1346 12/10/14 2110  BP:   125/57 120/50  Pulse:   79 71  Temp:   98.4 F (36.9 C) 98.5 F (36.9 C)  TempSrc:   Oral Oral  Resp:  30 20 20   Height:      Weight: 116.212 kg (256 lb 3.2 oz)  SpO2:  97% 96% 100%    Last BM Date: 12/10/14  Intake/Output   Yesterday:  05/10 0701 - 05/11 0700 In: 600 [P.O.:600] Out: 1750 [Urine:1250; Stool:500] This shift:     Bowel function:  Flatus: YES  BM: Thick stool  Drain: n/a  Physical  Exam:  General: Pt awake/alert/oriented in no acute distress.  Tired but better Eyes: PERRL, normal EOM.  Sclera clear.  No icterus Neuro: CN II-XII intact w/o focal sensory/motor deficits. Lymph: No head/neck/groin lymphadenopathy Psych:  No delerium/psychosis/paranoia HENT: Normocephalic, Mucus membranes moist.  No thrush.   Neck: Supple, No tracheal deviation Chest: No chest wall pain w good excursion CV:  Pulses intact.  Regular rhythm MS: Normal AROM mjr joints.  No obvious deformity Abdomen: Soft.  Nondistended.  Incision closed.   LLQ old colostomy site no induration nor erythema.  Improved.   No evidence of peritonitis.  No incarcerated hernias.  New LUQ colostomy pink w edema - mild lateral induration Ext:  SCDs BLE.  No mjr edema.  No cyanosis Skin: No petechiae / purpura  Results:   Labs: Results for orders placed or performed during the hospital encounter of 12/02/14 (from the past 48 hour(s))  Glucose, capillary     Status: Abnormal   Collection Time: 12/09/14  7:42 AM  Result Value Ref Range   Glucose-Capillary 176 (H) 70 - 99 mg/dL  Glucose, capillary     Status: Abnormal   Collection Time: 12/09/14 12:34 PM  Result Value Ref Range   Glucose-Capillary 154 (H) 70 - 99 mg/dL  Glucose, capillary     Status: None   Collection Time: 12/09/14  5:16 PM  Result Value Ref Range   Glucose-Capillary 99 70 - 99 mg/dL   Comment 1 Notify RN    Comment 2 Document in Chart   CBC with Differential     Status: Abnormal   Collection Time: 12/10/14  5:25 AM  Result Value Ref Range   WBC 6.0 4.0 - 10.5 K/uL   RBC 3.32 (L) 3.87 - 5.11 MIL/uL   Hemoglobin 8.9 (L) 12.0 - 15.0 g/dL   HCT 28.9 (L) 36.0 - 46.0 %   MCV 87.0 78.0 - 100.0 fL   MCH 26.8 26.0 - 34.0 pg   MCHC 30.8 30.0 - 36.0 g/dL   RDW 16.3 (H) 11.5 - 15.5 %   Platelets 335 150 - 400 K/uL   Neutrophils Relative % 62 43 - 77 %   Neutro Abs 3.8 1.7 - 7.7 K/uL   Lymphocytes Relative 16 12 - 46 %   Lymphs Abs 1.0 0.7 -  4.0 K/uL   Monocytes Relative 12 3 - 12 %   Monocytes Absolute 0.7 0.1 - 1.0 K/uL   Eosinophils Relative 10 (H) 0 - 5 %   Eosinophils Absolute 0.6 0.0 - 0.7 K/uL   Basophils Relative 0 0 - 1 %   Basophils Absolute 0.0 0.0 - 0.1 K/uL  Glucose, capillary     Status: Abnormal   Collection Time: 12/10/14  8:01 AM  Result Value Ref Range   Glucose-Capillary 143 (H) 70 - 99 mg/dL   Comment 1 Notify RN    Comment 2 Document in Chart   Glucose, capillary     Status: Abnormal   Collection Time: 12/10/14 11:51 AM  Result Value Ref Range   Glucose-Capillary 140 (H) 70 - 99 mg/dL   Comment 1 Notify RN    Comment 2 Document in Chart   Glucose, capillary  Status: Abnormal   Collection Time: 12/10/14  5:52 PM  Result Value Ref Range   Glucose-Capillary 126 (H) 70 - 99 mg/dL   Comment 1 Notify RN    Comment 2 Document in Chart   Glucose, capillary     Status: Abnormal   Collection Time: 12/10/14  9:04 PM  Result Value Ref Range   Glucose-Capillary 121 (H) 70 - 99 mg/dL   Comment 1 Notify RN   CBC with Differential     Status: Abnormal   Collection Time: 12/11/14  5:30 AM  Result Value Ref Range   WBC 5.1 4.0 - 10.5 K/uL   RBC 3.17 (L) 3.87 - 5.11 MIL/uL   Hemoglobin 8.5 (L) 12.0 - 15.0 g/dL   HCT 27.4 (L) 36.0 - 46.0 %   MCV 86.4 78.0 - 100.0 fL   MCH 26.8 26.0 - 34.0 pg   MCHC 31.0 30.0 - 36.0 g/dL   RDW 16.2 (H) 11.5 - 15.5 %   Platelets 320 150 - 400 K/uL   Neutrophils Relative % 56 43 - 77 %   Neutro Abs 2.9 1.7 - 7.7 K/uL   Lymphocytes Relative 23 12 - 46 %   Lymphs Abs 1.2 0.7 - 4.0 K/uL   Monocytes Relative 12 3 - 12 %   Monocytes Absolute 0.6 0.1 - 1.0 K/uL   Eosinophils Relative 9 (H) 0 - 5 %   Eosinophils Absolute 0.5 0.0 - 0.7 K/uL   Basophils Relative 0 0 - 1 %   Basophils Absolute 0.0 0.0 - 0.1 K/uL  Basic metabolic panel     Status: Abnormal   Collection Time: 12/11/14  5:30 AM  Result Value Ref Range   Sodium 138 135 - 145 mmol/L   Potassium 3.1 (L) 3.5 - 5.1  mmol/L    Comment: RESULT REPEATED AND VERIFIED DELTA CHECK NOTED NO VISIBLE HEMOLYSIS    Chloride 102 101 - 111 mmol/L   CO2 30 22 - 32 mmol/L   Glucose, Bld 115 (H) 70 - 99 mg/dL   BUN 9 6 - 20 mg/dL   Creatinine, Ser 0.66 0.44 - 1.00 mg/dL   Calcium 8.3 (L) 8.9 - 10.3 mg/dL   GFR calc non Af Amer >60 >60 mL/min   GFR calc Af Amer >60 >60 mL/min    Comment: (NOTE) The eGFR has been calculated using the CKD EPI equation. This calculation has not been validated in all clinical situations. eGFR's persistently <60 mL/min signify possible Chronic Kidney Disease.    Anion gap 6 5 - 15  Magnesium     Status: Abnormal   Collection Time: 12/11/14  5:30 AM  Result Value Ref Range   Magnesium 1.3 (L) 1.7 - 2.4 mg/dL    Imaging / Studies: No results found.  Medications / Allergies: per chart  Antibiotics: Anti-infectives    Start     Dose/Rate Route Frequency Ordered Stop   12/09/14 1200  vancomycin (VANCOCIN) 1,250 mg in sodium chloride 0.9 % 250 mL IVPB     1,250 mg 166.7 mL/hr over 90 Minutes Intravenous Every 12 hours 12/09/14 0833     12/05/14 0600  gentamicin (GARAMYCIN) IVPB 100 mg  Status:  Discontinued    Comments:  Pharmacy may adjust dosing strength, schedule, rate of infusion, etc as needed to optimize therapy Send with patient on call to the OR.  Anesthesia to complete antibiotic administration <34mn [facility]   100 mg 200 mL/hr over 30 Minutes Intravenous  On call to O.R. 12/04/14 1346 12/04/14 1352   12/04/14 1400  gentamicin (GARAMYCIN) 380 mg in dextrose 5 % 100 mL IVPB     5 mg/kg  75.4 kg (Adjusted) 109.5 mL/hr over 60 Minutes Intravenous  Once 12/04/14 1349 12/04/14 1430   12/03/14 0600  vancomycin (VANCOCIN) IVPB 1000 mg/200 mL premix  Status:  Discontinued     1,000 mg 200 mL/hr over 60 Minutes Intravenous Every 12 hours 12/02/14 1817 12/09/14 0832   12/02/14 1930  clindamycin (CLEOCIN) IVPB 600 mg     600 mg 100 mL/hr over 30  Minutes Intravenous 4 times per day 12/02/14 1847     12/02/14 1830  metroNIDAZOLE (FLAGYL) IVPB 500 mg  Status:  Discontinued     500 mg 100 mL/hr over 60 Minutes Intravenous Every 8 hours 12/02/14 1816 12/02/14 1847   12/02/14 1815  vancomycin (VANCOCIN) 2,000 mg in sodium chloride 0.9 % 500 mL IVPB     2,000 mg 250 mL/hr over 120 Minutes Intravenous  Once 12/02/14 1814 12/02/14 2249   12/02/14 1815  ciprofloxacin (CIPRO) IVPB 400 mg  Status:  Discontinued     400 mg 200 mL/hr over 60 Minutes Intravenous Every 12 hours 12/02/14 1816 12/02/14 1847   12/02/14 1800  metroNIDAZOLE (FLAGYL) IVPB 500 mg  Status:  Discontinued     500 mg 100 mL/hr over 60 Minutes Intravenous Every 8 hours 12/02/14 1649 12/02/14 1733   12/02/14 1700  ciprofloxacin (CIPRO) IVPB 200 mg  Status:  Discontinued     200 mg 100 mL/hr over 60 Minutes Intravenous Every 12 hours 12/02/14 1646 12/02/14 1733       Note: Portions of this report may have been transcribed using voice recognition software. Every effort was made to ensure accuracy; however, inadvertent computerized transcription errors may be present.   Any transcriptional errors that result from this process are unintentional.     [name], M.D., F.A.C.S. Gastrointestinal and Minimally Invasive Surgery [facility] [address] [place], [place] [address] [phone] Main / Paging   12/11/2014

## 2014-12-11 NOTE — Progress Notes (Signed)
Cystogram shows no evidence of leak.  As per discussions with gynecological oncology and myself in the past,, OK to remove Foley.  Hopefully the patient will continue to improve and possibly be able to be discharged tomorrow.  [name], M.D., F.A.C.S. Gastrointestinal and Minimally Invasive Surgery [facility] [address] [place], [place] [address] [phone] Main / Paging

## 2014-12-11 NOTE — Consult Note (Addendum)
WOC wound follow up Wound type: NPWT VAC dressing change, ostomy takedown site.  Measurement: 2.5cm x 6cm x 3.5cm  Wound bed: clean, early granulation, subcutaneous tissue Drainage (amount, consistency, odor) minimal in canister Periwound: erythema that extends about 1.5cm mostly at the proximal wound edge Dressing procedure/placement/frequency: 1pc of black foam used to fill the defect, will need to make sure [facility] staff know to pack deeply as the area is deep but tight, I cut the foam in strip so that I could pack down in base and fill remainder of the space one continuous of piece of foam.  Drape and seal at 158mmHG.    Pt tolerated, was premedicated with PO pain meds, however she did report pain with dressing and ask her bedside nurse for IV pain meds after the dressing.   WOC ostomy follow up Stoma type/location: LUQ, end colostomy Stomal assessment/size: 2" budded, edematous, os in center Peristomal assessment: intact  Treatment options for stomal/peristomal skin: added 2" barrier ring  Output liquid green/brown  Ostomy pouching: 1pc.with 2" barrier ring.   Patient interested in beige pouchs and may want to use 2pc like she did before.  I have put both in her room for DC to home and I will send more samples to her house.  Enrolled patient in [place] Start Discharge program: Yes additional samples to be sent to home.     WOC will follow along with you for support with wound and ostomy care [facility] RN,CWOCN [id number]

## 2014-12-11 NOTE — Progress Notes (Signed)
Patient ID: [name], female   DOB: [birth year], 59 y.o.   MRN: [id number] INPATIENT PROGRESS NOTE  24 hour events: C/O improved nausea. Continues to tolerate PO.  Assessment:  59 y.o. 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  Plan: 1/ Peri-stomal infection/cellulitis--clinically improving: antibiotics per General Surgery 2/ Chronic neutropenia (idiopathic): WBC trending down   3/ Complex bladder repair with ureteral stents, foley placement. Continue foley catheter. Do not remove. Plan on retrograde cystogram tomorrow. Stent removal 6 weeks postop pending CT urogram results.  4/ Acute blood loss anemia: Hemoglobin stable; >7; asymptomatic; consider transfusion--discuss with team  5/ type II DM:  CBGs in range on oral hypoglycemic agents  7/ FEN: hypokalemia/hypomagnesemia--being replaced; urine magnesium pending  8/ Pain management: controlled on current oral medications  9/ GI function: Bowel function returning; diet per General Surgery  HPI: [name] is a 59 y.o. 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 She then underwent a posterior supralevator exenteration with colostomy and bladder repair and stent placement on [id number] without complications. Her postoperative course was uncomplicate with the exception of development of postop anemia. Her final pathology revealed endometrioid adenocarcinoma invading the vagina and rectum with negative margins on the specimen. The margin had been close (clinically) to the right pelvic sidewall which was m.  Postoperatively she developed cellulitis lateral to the colostomy site. She was  admitted to the hospital on 12/02/14 but showed minimal improvement with IV antibiotics. CT abdo/pelvis on 12/02/14 showed no abscess or apparent fistula. 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 The patient was taken back to the OR on 12/04/14 for laparoscopic colostomy revision and repositioning in the upper abdomen. The prior colostomy site was kept open at the subQ with wet to dry dressings. A wound vac was applied on POD 2.   Review of systems: Constitutional: She has no weight gain or weight loss. She has a low grade fever no chills. Eyes: No blurred vision Ears, Nose, Mouth, Throat: No dizziness, headaches or changes in hearing. No mouth sores. Cardiovascular: No chest pain, palpitations or edema. Respiratory: No shortness of breath, wheezing or cough Gastrointestinal: She has normal bowel movements trhough stoma. She denies any nausea or vomiting. She complains of cramping with meals Genitourinary: She denies pelvic pain, pelvic pressure or changes in her urinary function. She has no hematuria, dysuria, or incontinence (foley in situ). She has no irregular vaginal bleeding or vaginal discharge Musculoskeletal: Denies muscle weakness or joint pains.  Skin: She has no skin changes, rashes or itching Neurological: Denies dizziness or headaches. No neuropathy, no numbness or tingling. Psychiatric: She c/o less depressed mood today Hematologic/Lymphatic: No easy bruising or bleeding  Allergies  Allergen Reactions  . Penicillins Swelling    Facial swelling  . Ultram [Tramadol] Hives  . Adhesive [Tape]     blisters  . Cefaclor Rash    Ceclor  . Erythromycin     gastritis  . Trimethoprim Rash  . Sulfa Antibiotics Rash    Current Facility-Administered Medications on File Prior to Encounter  Medication Dose Route Frequency Provider Last Rate Last  Dose  . Tbo-Filgrastim (GRANIX) injection 480 mcg 480 mcg Subcutaneous Weekly Heath Lark, MD  480 mcg at 11/29/14 1338   Current Outpatient Prescriptions on File Prior to Encounter  Medication Sig Dispense Refill  . acetaminophen (TYLENOL) 500 MG tablet Take 2 tablets (1,000 mg total) by mouth 3 (three) times daily. 30 tablet 0  . Calcium Carbonate-Vitamin D (CALCIUM + D PO) Take 2 tablets by mouth daily.    . Canagliflozin (INVOKANA) 300 MG TABS Take 1 tablet by mouth every morning.     . Cholecalciferol (VITAMIN D3) 10000 UNITS capsule Take 10,000 Units by mouth once a week. Sundays    . docusate sodium (COLACE) 100 MG capsule Take 100 mg by mouth daily.    [name] Kitchen enoxaparin (LOVENOX) 40 MG/0.4ML injection Inject 0.4 mLs (40 mg total) into the skin daily. 21 Syringe 1  . ferrous sulfate 325 (65 FE) MG tablet Take 1 tablet (325 mg total) by mouth 2 (two) times daily with a meal. 60 tablet 3  . hydrochlorothiazide (HYDRODIURIL) 12.5 MG tablet Take 12.5 mg by mouth every morning.     [name] Kitchen ibuprofen (ADVIL,MOTRIN) 800 MG tablet     . levothyroxine (SYNTHROID) 175 MCG tablet Take 175 mcg by mouth daily before breakfast.    . metFORMIN (GLUCOPHAGE) 1000 MG tablet Take 1,000 mg by mouth 2 (two) times daily with a meal.     . Multiple Vitamin (MULTIVITAMIN WITH MINERALS) TABS tablet Take 1 tablet by mouth daily.    [name] Kitchen omega-3 acid ethyl esters (LOVAZA) 1 G capsule Take 1 g by mouth 2 (two) times daily.    [name] Kitchen omeprazole (PRILOSEC) 20 MG capsule Take 20 mg by mouth daily.    . ondansetron (ZOFRAN) 4 MG tablet Take 1 tablet (4 mg total) by mouth every 6 (six) hours as needed for nausea. 20 tablet 0  . ONETOUCH VERIO test strip     . oxyCODONE (OXY IR/ROXICODONE) 5 MG immediate release tablet Take 1-2 tablets (5-10 mg total) by mouth every 4 (four) hours as needed for moderate pain, severe pain or breakthrough pain.  30 tablet 0  . Polyethyl Glycol-Propyl Glycol (SYSTANE OP) Apply 1 drop to eye daily as needed (Dry eyes).     [name] Kitchen PROVENTIL HFA 108 (90 BASE) MCG/ACT inhaler   0  . rosuvastatin (CRESTOR) 10 MG tablet Take 10 mg by mouth every evening.     . sitaGLIPtin (JANUVIA) 100 MG tablet Take 100 mg by mouth daily.    . Tbo-Filgrastim (GRANIX) 480 MCG/0.8ML SOSY injection Inject 480 mcg into the skin once a week.    . traMADol (ULTRAM) 50 MG tablet   0  . valsartan (DIOVAN) 320 MG tablet Take 320 mg by mouth every morning.      Past Medical History  Diagnosis Date  . Hypothyroidism 12/14/2011  . Benign essential HTN 12/14/2011  . Neutropenia   . Postmenopausal bleeding 09/05/2014  . Anemia     Planned Neupogen injections for chronic neutropenia  . Transfusion history     '06-s/p hysterectomy and staging  . Multiple thyroid nodules y-8    Managed by Dr. Harlow Asa  . GERD (gastroesophageal reflux disease)   . Polyp of duodenum y-2  . DM type 2 (diabetes mellitus, type 2) 12/14/2011  . Dysrhythmia     skipped beat with occasional coffee   . Hx of bronchitis   . Depression   . Hiatal hernia y-12    hx of small hiatal hernia   . Neutropenia     chronic   . Cancer     '06-Ovarian/  Uterine Cancer-chemotherapy  . 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 Appendectomy    . Tonsillectomy    . Cholecystectomy      laparoscopic  . Eye surgery      pytosis of eyelids-child  . Colonoscopy with propofol N/A 08/21/2013    Procedure: COLONOSCOPY WITH PROPOFOL; Surgeon: Cleotis Nipper, MD; Location: [facility]; Service: Endoscopy; Laterality: N/A;  . Abdominal hysterectomy      staging for Ovarian cancer  . Eus N/A 10/02/2014    Procedure: LOWER ENDOSCOPIC ULTRASOUND (EUS); Surgeon:  Arta Silence, MD; Location: [facility]; Service: Endoscopy; Laterality: N/A;  . Robotic assisted lap vaginal hysterectomy N/A 11/19/2014    Procedure: ROBOTIC LYSIS OF ADHESIONS, CONVERTED TO LAPAROTOMY RADICAL UPPER VAGINECTOMY,LOW ANTERIOR BOWEL RESECTION, COLOSTOMY, BILATERAL URETERAL STENT PLACEMENT AND CYSTONOMY CLOSURE; Surgeon: Everitt Amber, MD; Location: [facility]; Service: Gynecology; Laterality: N/A;  . Ostomy N/A 11/19/2014    Procedure: OSTOMY; Surgeon: Michael [name], MD; Location: [facility]; Service: General; Laterality: N/A;    Family History  Problem Relation Age of Onset  . Cancer Mother     stomach ca  . Hypertension Mother   . Cancer Father     prostate ca  . Diabetes Father   . Diabetes Sister   . Hypertension Brother y-10    History   Social History  . Marital Status: Married    Spouse Name: N/A  . Number of Children: N/A  . Years of Education: N/A   Occupational History  . Not on file.   Social History Main Topics  . Smoking status: Never Smoker   . Smokeless tobacco: Never Used  . Alcohol Use: Yes     Comment: rare social  . Drug Use: No  . Sexual Activity: Not Currently   Other Topics Concern  . Not on file   Social History Narrative   CBC    Component Value Date/Time   WBC 5.1 12/11/2014 0530   WBC 2.1* 11/29/2014 1251   WBC 2.4* 08/25/2014 1541   RBC 3.17* 12/11/2014 0530   RBC 2.66* 11/29/2014 1251   RBC 4.75 08/25/2014 1541   HGB 8.5* 12/11/2014 0530   HGB 7.2* 11/29/2014 1251   HGB 13.0 08/25/2014 1541   HCT 27.4* 12/11/2014 0530   HCT 23.4* 11/29/2014 1251   HCT 41.3 08/25/2014 1541   PLT 320 12/11/2014 0530   PLT 548* 11/29/2014 1251   MCV 86.4 12/11/2014 0530   MCV 88.0 11/29/2014 1251   MCV 86.8 08/25/2014 1541   MCH 26.8 12/11/2014 0530   MCH 27.1 11/29/2014 1251   MCH 27.5 08/25/2014 1541   MCHC 31.0  12/11/2014 0530   MCHC 30.8* 11/29/2014 1251   MCHC 31.6* 08/25/2014 1541   RDW 16.2* 12/11/2014 0530   RDW 17.5* 11/29/2014 1251   LYMPHSABS 1.2 12/11/2014 0530   LYMPHSABS 0.6* 11/29/2014 1251   MONOABS 0.6 12/11/2014 0530   MONOABS 0.8 11/29/2014 1251   EOSABS 0.5 12/11/2014 0530   EOSABS 0.2 11/29/2014 1251   BASOSABS 0.0 12/11/2014 0530   BASOSABS 0.0 11/29/2014 1251    BMET    Component Value Date/Time   NA 138 12/11/2014 0530   NA 140 11/29/2014 1251   K 3.1* 12/11/2014 0530   K 3.5 11/29/2014 1251   CL 102 12/11/2014 0530   CO2 30 12/11/2014 0530   CO2 30* 11/29/2014 1251   GLUCOSE 115* 12/11/2014 0530   GLUCOSE 168* 11/29/2014 1251  BUN 9 12/11/2014 0530   BUN 11.2 11/29/2014 1251   CREATININE 0.66 12/11/2014 0530   CREATININE 0.7 11/29/2014 1251   CREATININE 0.84 08/25/2014 1538   CALCIUM 8.3* 12/11/2014 0530   CALCIUM 9.3 11/29/2014 1251   GFRNONAA >60 12/11/2014 0530   GFRNONAA 76 08/25/2014 1538   GFRAA >60 12/11/2014 0530   GFRAA 87 08/25/2014 1538      Physical Exam: Blood pressure 136/60, pulse 94, temperature 98.2 F (36.8 C), temperature source Oral, resp. rate 18, height 5\' 2"  (1.575 m), weight 252 lb 12.8 oz (114.669 kg). General: Well dressed, well nourished in no apparent distress.  HEENT: Normocephalic and atraumatic, no lesions. Extraocular muscles intact. Sclerae anicteric. Pupils equal, round, reactive. No mouth sores or ulcers. Thyroid is normal size, not nodular, midline. Skin: No lesions or rashes. Breasts: deferred Lungs: Clear to auscultation bilaterally. No wheezes. Cardiovascular: Regular rate and rhythm. No murmurs or rubs. Abdomen: Soft, nontender, nondistended. No palpable masses. No hepatosplenomegaly. No ascites. Normal bowel sounds. No hernias. Midline incision is healing with no drainage or erythema. No drainage. Laparoscopic incisions are clean and dry.  Wound vac in place. Genitourinary:  deferred Extremities: No cyanosis, clubbing. LLE edema (L>R). No calf tenderness or erythema. No palpable cords. Psychiatric: Mood and affect are appropriate. Neurological: Awake, alert and oriented x 3. Sensation is intact, no neuropathy.  Musculoskeletal: No pain, normal strength and range of motion.  JACKSON-MOORE,Teonia Yager A       Routing History

## 2014-12-12 ENCOUNTER — Ambulatory Visit (HOSPITAL_COMMUNITY): Payer: BLUE CROSS/BLUE SHIELD

## 2014-12-12 LAB — CBC WITH DIFFERENTIAL/PLATELET
Basophils Absolute: 0 10*3/uL (ref 0.0–0.1)
Basophils Relative: 1 % (ref 0–1)
EOS ABS: 0.4 10*3/uL (ref 0.0–0.7)
Eosinophils Relative: 9 % — ABNORMAL HIGH (ref 0–5)
HCT: 27 % — ABNORMAL LOW (ref 36.0–46.0)
HEMOGLOBIN: 8.2 g/dL — AB (ref 12.0–15.0)
LYMPHS ABS: 1 10*3/uL (ref 0.7–4.0)
Lymphocytes Relative: 25 % (ref 12–46)
MCH: 26.4 pg (ref 26.0–34.0)
MCHC: 30.4 g/dL (ref 30.0–36.0)
MCV: 86.8 fL (ref 78.0–100.0)
MONOS PCT: 17 % — AB (ref 3–12)
Monocytes Absolute: 0.7 10*3/uL (ref 0.1–1.0)
NEUTROS PCT: 48 % (ref 43–77)
Neutro Abs: 1.9 10*3/uL (ref 1.7–7.7)
Platelets: 318 10*3/uL (ref 150–400)
RBC: 3.11 MIL/uL — ABNORMAL LOW (ref 3.87–5.11)
RDW: 16.3 % — ABNORMAL HIGH (ref 11.5–15.5)
WBC: 4 10*3/uL (ref 4.0–10.5)

## 2014-12-12 LAB — GLUCOSE, CAPILLARY
GLUCOSE-CAPILLARY: 121 mg/dL — AB (ref 65–99)
Glucose-Capillary: 136 mg/dL — ABNORMAL HIGH (ref 65–99)

## 2014-12-12 LAB — MAGNESIUM, URINE: Magnesium, Ur: 3.2 mg/dL

## 2014-12-12 LAB — CREATININE, URINE, RANDOM: CREATININE, URINE: 104.8 mg/dL

## 2014-12-12 MED ORDER — POTASSIUM CHLORIDE CRYS ER 20 MEQ PO TBCR: 40.0000 meq | EXTENDED_RELEASE_TABLET | Freq: Every day | ORAL | Status: DC

## 2014-12-12 MED ORDER — OXYCODONE HCL 5 MG PO TABS: 5.0000 mg | ORAL_TABLET | ORAL | Status: DC | PRN

## 2014-12-12 MED ORDER — MAGNESIUM OXIDE 400 (241.3 MG) MG PO TABS: 400.0000 mg | ORAL_TABLET | Freq: Two times a day (BID) | ORAL | Status: DC

## 2014-12-12 NOTE — Consult Note (Signed)
W[facility] ostomy consult note Stoma type/location:  LUQ End colostomy Stomal assessment/size: Pouch in place.  Not assessed today.  Peristomal assessment: Intact Treatment options for stomal/peristomal skin: Barrier ring, per patient  Output Liquid brown stool Ostomy pouching: 1pc. With barrier ring. Patient is discharging today with [place].  Has supplies packed in suitcase.   Education provided: Informed her that [facility] will assist with ordering supplies going forward.  Patient is eager to go home today.  NPWT (VAC) dressing intact.  Home unit is in the room. Will be connected to home unit prior to discharge today.  No discharge orders yet.  Will not follow at this time.  Please re-consult if needed.  Domenic Moras RN BSN [facility] Pager [phone]

## 2014-12-12 NOTE — Progress Notes (Signed)
Patient's d/c instructions given,teach back utilized,verbalized understanding.Prescription given.Patient's pain is controlled with po Oxycodone, has been passing flatus,tolerated her diet and due meds,no c/o n/v,has ambulated with husband this afternoon. Colostomy pouch intact,liquid brown stool noted in the bag. I connected her wound vac to home unit,functioning well,no leak noted. Patient is in good spirits. Husband at bedside.

## 2014-12-12 NOTE — Progress Notes (Signed)
Patient d/c home,stable,dsg on her R arm from where the PICC was is clean,dry and intact.

## 2014-12-12 NOTE — Discharge Instructions (Signed)
ABDOMINAL SURGERY: POST OP INSTRUCTIONS  1. DIET: Follow a light bland diet the first 24 hours after arrival home, such as soup, liquids, crackers, etc.  Be sure to include lots of fluids daily.  Avoid fast food or heavy meals as your are more likely to get nauseated.  Eat a low fat the next few days after surgery.   2. Take your usually prescribed home medications unless otherwise directed. 3. PAIN CONTROL: a. Pain is best controlled by a usual combination of three different methods TOGETHER: i. Ice/Heat ii. Over the counter pain medication iii. Prescription pain medication b. Most patients will experience some swelling and bruising around the incisions.  Ice packs or heating pads (30-60 minutes up to 6 times a day) will help. Use ice for the first few days to help decrease swelling and bruising, then switch to heat to help relax tight/sore spots and speed recovery.  Some people prefer to use ice alone, heat alone, alternating between ice & heat.  Experiment to what works for you.  Swelling and bruising can take several weeks to resolve.   c. It is helpful to take an over-the-counter pain medication regularly for the first few weeks.  Choose one of the following that works best for you: i. Naproxen (Aleve, etc)  Two 267m tabs twice a day ii. Ibuprofen (Advil, etc) Three 2061mtabs four times a day (every meal & bedtime) iii. Acetaminophen (Tylenol, etc) 500-6502mour times a day (every meal & bedtime) d. A  prescription for pain medication (such as oxycodone, hydrocodone, etc) should be given to you upon discharge.  Take your pain medication as prescribed.  i. If you are having problems/concerns with the prescription medicine (does not control pain, nausea, vomiting, rash, itching, etc), please call us [phone] see if we need to switch you to a different pain medicine that will work better for you and/or control your side effect better. ii. If you need a refill on your pain medication,  please contact your pharmacy.  They will contact our office to request authorization. Prescriptions will not be filled after 5 pm or on week-ends. 4. Avoid getting constipated.  Between the surgery and the pain medications, it is common to experience some constipation.  Increasing fluid intake and taking a fiber supplement (such as Metamucil, Citrucel, FiberCon, MiraLax, etc) 1-2 times a day regularly will usually help prevent this problem from occurring.  A mild laxative (prune juice, Milk of Magnesia, MiraLax, etc) should be taken according to package directions if there are no bowel movements after 48 hours.   5. Watch out for diarrhea.  If you have many loose bowel movements, simplify your diet to bland foods & liquids for a few days.  Stop any stool softeners and decrease your fiber supplement.  Switching to mild anti-diarrheal medications (Kayopectate, Pepto Bismol) can help.  If this worsens or does not improve, please call us.[place]. Wash / shower every day.  You may shower over the incision / wound.  Avoid baths until the skin is fully healed.  Continue to shower over incision(s) after the dressing is off. 7. Remove your waterproof bandages 5 days after surgery.  You may leave the incision open to air.  Remove any wicks or ribbons in your wound.  If you have an open wound, please see wound care instructions. You may replace a dressing/Band-Aid to cover the incision for comfort if you wish. 8. ACTIVITIES as tolerated:   a. You may resume regular (light)  daily activities beginning the next day--such as daily self-care, walking, climbing stairs--gradually increasing activities as tolerated.  If you can walk 30 minutes without difficulty, it is safe to try more intense activity such as jogging, treadmill, bicycling, low-impact aerobics, swimming, etc. b. Save the most intensive and strenuous activity for last such as sit-ups, heavy lifting, contact sports, etc  Refrain from any heavy lifting or straining  until you are off narcotics for pain control.   c. DO NOT PUSH THROUGH PAIN.  Let pain be your guide: If it hurts to do something, don't do it.  Pain is your body warning you to avoid that activity for another week until the pain goes down. d. You may drive when you are no longer taking prescription pain medication, you can comfortably wear a seatbelt, and you can safely maneuver your car and apply brakes. e. Dennis Bast may have sexual intercourse when it is comfortable.  9. FOLLOW UP in our office a. Please call [facility] at [phone] to set up an appointment to see your surgeon in the office for a follow-up appointment approximately 1-2 weeks after your surgery. b. Make sure that you call for this appointment the day you arrive home to insure a convenient appointment time. 10. IF YOU HAVE DISABILITY OR FAMILY LEAVE FORMS, BRING THEM TO THE OFFICE FOR PROCESSING.  DO NOT GIVE THEM TO YOUR DOCTOR.   WHEN TO CALL us [phone]: 1. Poor pain control 2. Reactions / problems with new medications (rash/itching, nausea, etc)  3. Fever over 101.5 F (38.5 C) 4. Inability to urinate 5. Nausea and/or vomiting 6. Worsening swelling or bruising 7. Continued bleeding from incision. 8. Increased pain, redness, or drainage from the incision  The clinic staff is available to answer your questions during regular business hours (8:30am-5pm).  Please dont hesitate to call and ask to speak to one of our nurses for clinical concerns.   A surgeon from [facility] is always on call at the hospitals   If you have a medical emergency, go to the nearest emergency room or call 911.    [facility], [place], [place], [place], [place]  [address] ? MAIN: [phone] ? TOLL FREE: [phone] ? FAX [phone] [email or web address]  Ostomy Support Information  Yes, Theresia Majors heard that people get along just fine with only one of their eyes, or one of their lungs,  or one of their kidneys. But you also know that you have only one intestine and only one bladder, and that leaves you feeling awfully empty, both physically and emotionally: You think no other people go around without part of their intestine with the ends of their intestines sticking out through their abdominal walls.  Well, you are wrong! There are nearly three quarters of a million people in the [place] who have an ostomy; people who have had surgery to remove all or part of their colons or bladders. There is even a national association, the [place] [employer] [place] with over 350 local affiliated support groups that are organized by volunteers who provide peer support and counseling. Juan Quam has a toll free telephone num-ber, [phone] and an educational,  interactive website, www.ostomy.org   An ostomy is an opening in the belly (abdominal wall) made by surgery. Ostomates are people who have had this procedure. The opening (stoma) allows the kidney or bowel to discharge waste. An external pouch covers the stoma to collect waste. Pouches are are a simple bag  and are odor free. Different companies have disposable or reusable pouches to fit one's lifestyle. An ostomy can either be temporary or permanent.  THERE ARE THREE MAIN TYPES OF OSTOMIES  Colostomy. A colostomy is a surgically created opening in the large intestine (colon).  Ileostomy. An ileostomy is a surgically created opening in the small intestine.  Urostomy. A urostomy is a surgically created opening to divert urine away from the bladder. FREQUENTLY ASKED QUESTIONS   Why havent you met any of these folks who have an ostomy?  Well, maybe you have! You just did not recognize them because an ostomy doesn't show. It can be kept secret if you wish. Why, maybe some of your best friends, office associates or neighbors have an ostomy ... you never can tell.   People facing ostomy surgery have many quality-of-life questions like:  Will  you bulge? Smell? Make noises? Will you feel waste leaving your body? Will you be a captive of the toilet? Will you starve? Be a social outcast? Get/stay married? Have babies? Easily bathe, go swimming, bend over?  OK, lets look at what you can expect:  Will you bulge?  Remember, without part of the intestine or bladder, and its contents, you should have a flatter tummy than before. You can expect to wear, with little exception, what you wore before surgery ... and this in-cludes tight clothing and bathing suits.  Will you smell?  Today, thanks to modern odor proof pouching systems, you can walk into an ostomy support group meeting and not smell anything that is foul or offensive. And, for those with an ileostomy or colostomy who are concerned about odor when emptying their pouch, there are in-pouch deodorants that can be used to eliminate any waste odors that may exist.  Will you make noises?  Everyone produces gas, especially if they are an air-swallower. But intestinal sounds that occur from time to time are no differ-ent than a gurgling tummy, and quite often your clothing will muffle any sounds.   Will you feel the waste discharges?  For those with a colostomy or ileostomy there might be a slight pressure when waste leaves your body, but understand that the intestines have no nerve endings, so there will be no unpleasant sensations. Those with a urostomy will probably be unaware of any kidney drainage.  Will you be a captive of the toilet?  Immediately post-op you will spend more time in the bathroom than you will after your body recovers from surgery. Every person is different, but on average those with an ileostomy or urostomy may empty their pouches 4 to 6 times a day; a little  less if you have a colostomy. The average wear time between pouch system changes is 3 to 5 days and the changing process should take less than 30 minutes.  Will I need to be on a special diet? Most people return to  their normal diet when they have recovered from surgery. Be sure to chew your food well, eat a well-balanced diet and drink plenty of fluids. If you experience problems with a certain food, wait a couple of weeks and try it again. Will there be odor and noises? Pouching systems are designed to be odor-proof or odor-resistant. There are deodorants that can be used in the pouch. Medications are also available to help reduce odor. Limit gas-producing foods and carbonated beverages. You will experience less gas and fewer noises as you heal from surgery. How much time will it take to care  for my ostomy? At first, you may spend a lot of time learning about your ostomy and how to take care of it. As you become more comfortable and skilled at changing the pouching system, it will take very little time to care for it.  Will I be able to return to work? People with ostomies can perform most jobs. As soon as you have healed from surgery, you should be able to return to work. Heavy lifting (more than 10 pounds) may be discouraged.  What about intimacy? Sexual relationships and intimacy are important and fulfilling aspects of your life. They should continue after ostomy surgery. Intimacy-related concerns should be discussed openly between you and your partner.  Can I wear regular clothing? You do not need to wear special clothing. Ostomy pouches are fairly flat and barely noticeable. Elastic undergarments will not hurt the stoma or prevent the ostomy from functioning.  Can I participate in sports? An ostomy should not limit your involvement in sports. Many people with ostomies are runners, skiers, swimmers or participate in other active lifestyles. Talk with your caregiver first before doing heavy physical activity.  Will you starve?  Not if you follow doctors orders at each stage of your post-op adjustment. There is no such thing as an ostomy diet. Some people with an ostomy will be able to eat and tolerate  anything; others may find diffi-culty with some foods. Each person is an individual and must determine, by trial, what is best for them. A good practice for all is to drink plenty of water.  Will you be a social outcast?  Have you met anyone who has an ostomy and is a social outcast? Why should you be the first? Only your attitude and self image will effect how you are treated. No confi-dent person is an Occupational psychologist.   PROFESSIONAL HELP  Resources are available if you need help or have questions about your ostomy.    Specially trained nurses called Wound, Ostomy Continence Nurses (WOCN) are available for consultation in most major medical centers.   Consider getting an ostomy consult with Cena Benton at [facility] to help troubleshoot stoma pouch fittings and other issues with your ostomy: [phone]   The [employer] Ostomy Association (UOA) is a group made up of many local chapters throughout the [place]. These local groups hold meetings and provide support to prospective and existing ostomates. They sponsor educational events and have qualified visitors to make personal or telephone visits. Contact the UOA for the chapter nearest you and for other educational publications.  More detailed information can be found in Colostomy Guide, a publication of the [employer] (UOA). Contact UOA at 1-4048268542 or visit their web site at [email or web address] The website contains links to other sites, suppliers and resources. Document Released: 07/22/2003 Document Revised: 10/11/2011 Document Reviewed: 11/20/2008 ExitCare Patient Information 2013 [place]  It is important that the wound be kept open.   -Keeping the skin edges apart will allow the wound to gradually heal from the base upwards.   - If the skin edges of the wound close too early, a new fluid pocket can form and infection can occur. -This is the reason to pack deeper wounds with gauze or  ribbon -This is why drained wounds cannot be sewed closed right away  A healthy wound should form a lining of bright red "beefy" granulating tissue that will help shrink the wound and help the edges grow new skin into it.   -  A little mucus / yellow discharge is normal (the body's natural way to try and form a scab) and should be gently washed off with soap and water with daily dressing changes.  -Green or foul smelling drainage implies bacterial colonization and can slow wound healing - a short course of antibiotic ointment (3-5 days) can help it clear up.  Call the doctor if it does not improve or worsens  -Avoid use of antibiotic ointments for more than a week as they can slow wound healing over time.    -Sometimes other wound care products will be used to reduce need for dressing changes and/or help clean up dirty wounds -Sometimes the surgeon needs to debride the wound in the office to remove dead or infected tissue out of the wound so it can heal more quickly and safely.    Change the dressing at least once a day -Wash the wound with mild soap and water gently every day.  It is good to shower or bathe the wound to help it clean out. -Use clean 4x4 gauze for medium/large wounds or ribbon plain NU-gauze for smaller wounds (it does not need to be sterile, just clean) -Keep the raw wound moist with a little saline or KY (saline) gel on the gauze.  -A dry wound will take longer to heal.  -Keep the skin dry around the wound to prevent breakdown and irritation. -Pack the wound down to the base -The goal is to keep the skin apart, not overpack the wound -Use a Q-tip or blunt-tipped kabob stick toothpick to push the gauze down to the base in narrow or deep wounds   -Cover with a clean gauze and tape -paper or Medipore tape tend to be gentle on the skin -rotate the orientation of the tape to avoid repeated stress/trauma on the skin -using an ACE or Coban wrap on wounds on arms or legs can be used  instead.  Complete all antibiotics through the entire prescription to help the infection heal and prevent new places of infection   Returning the see the surgeon is helpful to follow the healing process and help the wound close as fast as possible.  Vacuum-Assisted Closure Therapy Home Guide Vacuum-assisted closure therapy (VAC therapy) is a device that helps wounds heal. It is used on wounds that cannot be closed with stitches. They often heal slowly. VAC therapy helps the wound stay clean and healthy while its edges slowly grow back together. VAC therapy uses a bandage (dressing) that is made of foam. It is put inside the wound. Then, a drape is placed over the wound. This drape sticks to your skin (adhesive) to keep air out. A tube is hooked up to a small pump and is attached to the drape. The pump sucks fluid and germs from the wound. It can also decrease any bad smell that comes from the wound. RISKS AND COMPLICATIONS VAC therapy is usually safe to use at home. Your skin may get sore from the adhesive drape. That is the most common problem. However, more serious problems can develop, such as:   Bleeding. This can happen if the dressing in the wound comes into contact with blood vessels. A little bleeding may occur when the dressing is being changed. This is normal now and then. Major bleeding can happen if a large blood vessel breaks. This is more likely if you are taking blood-thinning medicine. Emergency surgery may be needed.  Infection. This can happen if the dressing has an air leak  that is not repaired within a couple of hours.  Dehydration. This can happen if the pump sucks out too much body fluid. DRESSING CHANGES Your dressing will have to be changed. Sometimes this is needed once a day. Other times, a dressing change must be done 3 times a week. How often you change your dressing will depend on what your wound is like. A trained caregiver will most likely change the dressing.  However, a family member or friend may be trained to change the dressing. Below are steps to change a dressing in order to prevent an infection. The steps apply to you or the person that changes your dressing.  Wash your hands with soap and water before and after each dressing change.  Wear gloves and protective clothing. This may include eye protection.  Do not allow anyone to change your dressing if they have an infection or a skin condition. Even a small cut can be a problem. To change the dressing:   Turn off the pump.  Take off the adhesive drape.  Disconnect the tube from the dressing.  Take out the dressing that is inside the wound. If the dressing sticks, use a germ-free (sterile), saltwater solution to wet the dressing. This helps it come out more easily. If it hurts when the dressing is changed, take pain medicine 30 minutes before the dressing change.  Cleanse the wound with normal saline or sterile water.  Apply a skin barrier film to the skin that will be covered with the drape. This will protect the skin.  Put a new dressing into the wound.  Apply a new drape and tube.  Replace the container in the pump that collects fluid if it is full. Do this at least once per week.  Turn the pump back on.  Your doctor will decide what setting of suction is best. Do not change the settings on the machine without talking to your nurse or doctor. HOME CARE INSTRUCTIONS   The VAC pump has an alarm. It goes off if there are any problems such as a leak.  Ask your caregiver what to do if the alarm goes off.  Call your caregiver right away if the alarm goes off and you cannot fix the problem.  Do not turn off the pump for more than 2 hours.  Check your wound carefully at each dressing change for signs of infection. Watch for redness, swelling, or any fluid leaking from the wound. If you develop an infection:  You may have to stop VAC therapy.  The wound will need to be cleaned  and washed out.  You will have to take antibiotic medicine.  Ask your caregiver what activities you should or should not do while you are getting VAC therapy. This will depend on your particular wound.  Ask if it is okay to turn off the pump so you can take a shower. If it is okay, make sure the wound is covered with plastic. The wound area must stay dry.  Drink enough fluids to keep your urine clear or pale yellow.  Eat foods that contain a lot of protein. Examples are meat, poultry, seafood, eggs, nuts, beans, and peas. Protein can help your wound heal. SEEK MEDICAL CARE IF:  Your wound itches or hurts.  Dressing changes are often painful or bleeding often occurs.  You have a headache.  You have diarrhea.  You have a sore throat.  You have a rash.  You feel nauseous.  You feel dizzy  or weak. SEEK IMMEDIATE MEDICAL CARE IF:   You have very bad pain.  You have bleeding that will not stop.  Your wound smells bad.  You have redness, swelling, or fluid leaking from your wound.  Your alarm goes off and you do not know what to do.  You have a fever. Document Released: 10/11/2011 Document Reviewed: 10/11/2011 [facility] Patient Information 2015 [place]. This information is not intended to replace advice given to you by your health care provider. Make sure you discuss any questions you have with your health care provider.  GETTING TO GOOD BOWEL HEALTH. Irregular bowel habits such as constipation and diarrhea can lead to many problems over time.  Having one soft bowel movement a day is the most important way to prevent further problems.  The anorectal canal is designed to handle stretching and feces to safely manage our ability to get rid of solid waste (feces, poop, stool) out of our body.  BUT, hard constipated stools can act like ripping concrete bricks and diarrhea can be a burning fire to this very sensitive area of our body, causing inflamed hemorrhoids, anal  fissures, increasing risk is perirectal abscesses, abdominal pain/bloating, an making irritable bowel worse.     The goal: ONE SOFT BOWEL MOVEMENT A DAY!  To have soft, regular bowel movements:   Drink at least 8 tall glasses of water a day.    Take plenty of fiber.  Fiber is the undigested part of plant food that passes into the colon, acting s natures broom to encourage bowel motility and movement.  Fiber can absorb and hold large amounts of water. This results in a larger, bulkier stool, which is soft and easier to pass. Work gradually over several weeks up to 6 servings a day of fiber (25g a day even more if needed) in the form of: o Vegetables -- Root (potatoes, carrots, turnips), leafy green (lettuce, salad greens, celery, spinach), or cooked high residue (cabbage, broccoli, etc) o Fruit -- Fresh (unpeeled skin & pulp), Dried (prunes, apricots, cherries, etc ),  or stewed ( applesauce)  o Whole grain breads, pasta, etc (whole wheat)  o Bran cereals   Bulking Agents -- This type of water-retaining fiber generally is easily obtained each day by one of the following:  o Psyllium bran -- The psyllium plant is remarkable because its ground seeds can retain so much water. This product is available as Metamucil, Konsyl, Effersyllium, Per Diem Fiber, or the less expensive generic preparation in drug and health food stores. Although labeled a laxative, it really is not a laxative.  o Methylcellulose -- This is another fiber derived from wood which also retains water. It is available as Citrucel. o Polyethylene Glycol - and artificial fiber commonly called Miralax or Glycolax.  It is helpful for people with gassy or bloated feelings with regular fiber o Flax Seed - a less gassy fiber than psyllium  No reading or other relaxing activity while on the toilet. If bowel movements take longer than 5 minutes, you are too constipated  AVOID CONSTIPATION.  High fiber and water intake usually takes care of  this.  Sometimes a laxative is needed to stimulate more frequent bowel movements, but   Laxatives are not a good long-term solution as it can wear the colon out. o Osmotics (Milk of Magnesia, Fleets phosphosoda, Magnesium citrate, MiraLax, GoLytely) are safer than  o Stimulants (Senokot, Castor Oil, Dulcolax, Ex Lax)    o Do not take laxatives for more  than 7days in a row.   IF SEVERELY CONSTIPATED, try a Bowel Retraining Program: o Do not use laxatives.  o Eat a diet high in roughage, such as bran cereals and leafy vegetables.  o Drink six (6) ounces of prune or apricot juice each morning.  o Eat two (2) large servings of stewed fruit each day.  o Take one (1) heaping tablespoon of a psyllium-based bulking agent twice a day. Use sugar-free sweetener when possible to avoid excessive calories.  o Eat a normal breakfast.  o Set aside 15 minutes after breakfast to sit on the toilet, but do not strain to have a bowel movement.  o If you do not have a bowel movement by the third day, use an enema and repeat the above steps.   Controlling diarrhea o Switch to liquids and simpler foods for a few days to avoid stressing your intestines further. o Avoid dairy products (especially milk & ice cream) for a short time.  The intestines often can lose the ability to digest lactose when stressed. o Avoid foods that cause gassiness or bloating.  Typical foods include beans and other legumes, cabbage, broccoli, and dairy foods.  Every person has some sensitivity to other foods, so listen to our body and avoid those foods that trigger problems for you. o Adding fiber (Citrucel, Metamucil, psyllium, Miralax) gradually can help thicken stools by absorbing excess fluid and retrain the intestines to act more normally.  Slowly increase the dose over a few weeks.  Too much fiber too soon can backfire and cause cramping & bloating. o Probiotics (such as active yogurt, Align, etc) may help repopulate the intestines and  colon with normal bacteria and calm down a sensitive digestive tract.  Most studies show it to be of mild help, though, and such products can be costly. o Medicines: - Bismuth subsalicylate (ex. Kayopectate, Pepto Bismol) every 30 minutes for up to 6 doses can help control diarrhea.  Avoid if pregnant. - Loperamide (Immodium) can slow down diarrhea.  Start with two tablets (5m total) first and then try one tablet every 6 hours.  Avoid if you are having fevers or severe pain.  If you are not better or start feeling worse, stop all medicines and call your doctor for advice o Call your doctor if you are getting worse or not better.  Sometimes further testing (cultures, endoscopy, X-ray studies, bloodwork, etc) may be needed to help diagnose and treat the cause of the diarrhea.  Managing Pain  Pain after surgery or related to activity is often due to strain/injury to muscle, tendon, nerves and/or incisions.  This pain is usually short-term and will improve in a few months.   Many people find it helpful to do the following things TOGETHER to help speed the process of healing and to get back to regular activity more quickly:  1. Avoid heavy physical activity at first a. No lifting greater than 20 pounds at first, then increase to lifting as tolerated over the next few weeks b. Do not push through the pain.  Listen to your body and avoid positions and maneuvers than reproduce the pain.  Wait a few days before trying something more intense c. Walking is okay as tolerated, but go slowly and stop when getting sore.  If you can walk 30 minutes without stopping or pain, you can try more intense activity (running, jogging, aerobics, cycling, swimming, treadmill, sex, sports, weightlifting, etc ) d. Remember: If it hurts to do it, then  dont do it!  2. Take Anti-inflammatory medication a. Choose ONE of the following over-the-counter medications: i.            Acetaminophen 537m tabs (Tylenol) 1-2 pills  with every meal and just before bedtime (avoid if you have liver problems) ii.            Naproxen 2225mtabs (ex. Aleve) 1-2 pills twice a day (avoid if you have kidney, stomach, IBD, or bleeding problems) iii. Ibuprofen 20027mabs (ex. Advil, Motrin) 3-4 pills with every meal and just before bedtime (avoid if you have kidney, stomach, IBD, or bleeding problems) b. Take with food/snack around the clock for 1-2 weeks i. This helps the muscle and nerve tissues become less irritable and calm down faster  3. Use a Heating pad or Ice/Cold Pack a. 4-6 times a day b. May use warm bath/hottub  or showers  4. Try Gentle Massage and/or Stretching  a. at the area of pain many times a day b. stop if you feel pain - do not overdo it  Try these steps together to help you body heal faster and avoid making things get worse.  Doing just one of these things may not be enough.    If you are not getting better after two weeks or are noticing you are getting worse, contact our office for further advice; we may need to re-evaluate you & see what other things we can do to help.

## 2014-12-12 NOTE — Discharge Summary (Signed)
Physician Discharge Summary  Patient ID: [name] MRN: [id number] DOB/AGE: 03-27-1956 59 y.o.  Admit date: 12/02/2014 Discharge date: 12/12/2014  Patient Care Team: Leana Gamer, MD as PCP - General (Internal Medicine) [name], MD as Referring Physician (Obstetrics and Gynecology) Heath Lark, MD as Consulting Physician (Hematology and Oncology) Everitt Amber, MD as Consulting Physician (Obstetrics and Gynecology) Michael [name], MD as Consulting Physician (General Surgery)  Admission Diagnoses: Principal Problem:   Cellulitis of left abdominal wall near colostomy Active Problems:   Chronic neutropenia   Hypomagnesemia   Hypokalemia   Discharge Diagnoses:  Principal Problem:   Cellulitis of left abdominal wall near colostomy Active Problems:   Chronic neutropenia   Hypomagnesemia   Hypokalemia  11/19/2014 POST-OPERATIVE DIAGNOSIS: 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  PROCEDURE:  LAPAROSCOPIC/ROBOTIC LYSIS OF ADHESIONS*^ EXPLORATORY LAPAROTOMY^ RESECTION OF PELVIC MASS^ RADICAL UPPER VAGINECTOMY^ LOW ANTERIOR RECTOSIGMOID RESECTION* END DESCENDING COLOSTOMY* BILATERAL URETERAL STENT PLACEMENT^ CYSTOTOMY CLOSURE^   SURGEON: : Everitt Amber, MD^ Michael [name], MD*  ASSISTANT: Lahoma Crocker, MD  SURGERY:  12/04/2014  POST-OPERATIVE DIAGNOSIS:  abscess with colostomy perforation  LAPROSCOPIC LYSIS OF ADHESIONS, SPLENIC MOBILIZATION, RELOCATION OF COLOSTOMY, DEBRIDEMENT INITIAL COLOSTOMY SITE  SURGEON:  Surgeon(s): Michael [name], MD  Consults: [facility] Hospital Course:   The patient With recurrent endometrial cancer adherent to the vagina and rectum.  Underwent major resection last month.  Recovered went home.  Developed worsening cellulitis around colostomy site.  Refractory to oral medications.  Was admitted by gynecological oncology.  CT scan did not note any abscess collection.  Some gas in the abdominal wall consistent with prior robotic  surgery.  No evidence of fasciitis.  Cellulitis stabilized after few days.  However patient had worsening pain.  Borderline anemia worsened.  Then began to have feculent drainage from along the colostomy lateral edge suspicious for perforation or abscess.  Therefore taken the operating room and underwent resection and relocation of colostomy.  Transfuse blood as well.  Postoperatively, the patient gradually mobilized and advanced to a solid diet.  She then developed some nausea and vomiting with ileus.  Found to have moderately severe hypomagnesemia and hypokalemia.  Aggressively treated.  Pain and other symptoms were treated aggressively.  Began have flatus. Had moderate right-sided colon constipation.  Underwent gradual cleanout with Mira lax.  That helped.  Patient's cellulitis resolved.  His able to switch her old colostomy wound to a wound VAC without worsening status.  Wound closed and well.  Good granulation.  Patient stable off antibiotics.  Patient's drain creatinine was normal.  Therefore removed.  Patient had a cystogram done which showed no bladder leak.  Therefore Foley catheter removed.  By the time of discharge, the patient was walking well the hallways, eating food, having flatus.  Pain was well-controlled on an oral medications.  Based on meeting discharge criteria and continuing to recover, I felt it was safe for the patient to be discharged from the hospital to further recover with close followup. Postoperative recommendations were discussed in detail.  They are written as well.  She is to resume home health colostomy care as well as new wound VACcare.  Supplies are available written.  She had in-house wound ostomy care consultation as well.   Significant Diagnostic Studies:  Results for orders placed or performed during the hospital encounter of 12/02/14 (from the past 72 hour(s))  Glucose, capillary     Status: Abnormal   Collection Time: 12/09/14 12:34 PM  Result Value Ref Range  Glucose-Capillary 154 (H) 70 - 99 mg/dL  Glucose, capillary     Status: None   Collection Time: 12/09/14  5:16 PM  Result Value Ref Range   Glucose-Capillary 99 70 - 99 mg/dL   Comment 1 Notify RN    Comment 2 Document in Chart   CBC with Differential     Status: Abnormal   Collection Time: 12/10/14  5:25 AM  Result Value Ref Range   WBC 6.0 4.0 - 10.5 K/uL   RBC 3.32 (L) 3.87 - 5.11 MIL/uL   Hemoglobin 8.9 (L) 12.0 - 15.0 g/dL   HCT 28.9 (L) 36.0 - 46.0 %   MCV 87.0 78.0 - 100.0 fL   MCH 26.8 26.0 - 34.0 pg   MCHC 30.8 30.0 - 36.0 g/dL   RDW 16.3 (H) 11.5 - 15.5 %   Platelets 335 150 - 400 K/uL   Neutrophils Relative % 62 43 - 77 %   Neutro Abs 3.8 1.7 - 7.7 K/uL   Lymphocytes Relative 16 12 - 46 %   Lymphs Abs 1.0 0.7 - 4.0 K/uL   Monocytes Relative 12 3 - 12 %   Monocytes Absolute 0.7 0.1 - 1.0 K/uL   Eosinophils Relative 10 (H) 0 - 5 %   Eosinophils Absolute 0.6 0.0 - 0.7 K/uL   Basophils Relative 0 0 - 1 %   Basophils Absolute 0.0 0.0 - 0.1 K/uL  Glucose, capillary     Status: Abnormal   Collection Time: 12/10/14  8:01 AM  Result Value Ref Range   Glucose-Capillary 143 (H) 70 - 99 mg/dL   Comment 1 Notify RN    Comment 2 Document in Chart   Glucose, capillary     Status: Abnormal   Collection Time: 12/10/14 11:51 AM  Result Value Ref Range   Glucose-Capillary 140 (H) 70 - 99 mg/dL   Comment 1 Notify RN    Comment 2 Document in Chart   Culture, Urine     Status: None   Collection Time: 12/10/14  1:45 PM  Result Value Ref Range   Specimen Description URINE, CATHETERIZED    Special Requests cleocin, vancomycin Normal    Colony Count NO GROWTH Performed at [facility]     Culture NO GROWTH Performed at [facility]     Report Status 12/11/2014 FINAL   Glucose, capillary     Status: Abnormal   Collection Time: 12/10/14  5:52 PM  Result Value Ref Range   Glucose-Capillary 126 (H) 70 - 99 mg/dL   Comment 1 Notify RN    Comment 2 Document in  Chart   Glucose, capillary     Status: Abnormal   Collection Time: 12/10/14  9:04 PM  Result Value Ref Range   Glucose-Capillary 121 (H) 70 - 99 mg/dL   Comment 1 Notify RN   CBC with Differential     Status: Abnormal   Collection Time: 12/11/14  5:30 AM  Result Value Ref Range   WBC 5.1 4.0 - 10.5 K/uL   RBC 3.17 (L) 3.87 - 5.11 MIL/uL   Hemoglobin 8.5 (L) 12.0 - 15.0 g/dL   HCT 27.4 (L) 36.0 - 46.0 %   MCV 86.4 78.0 - 100.0 fL   MCH 26.8 26.0 - 34.0 pg   MCHC 31.0 30.0 - 36.0 g/dL   RDW 16.2 (H) 11.5 - 15.5 %   Platelets 320 150 - 400 K/uL   Neutrophils Relative % 56 43 - 77 %   Neutro  Abs 2.9 1.7 - 7.7 K/uL   Lymphocytes Relative 23 12 - 46 %   Lymphs Abs 1.2 0.7 - 4.0 K/uL   Monocytes Relative 12 3 - 12 %   Monocytes Absolute 0.6 0.1 - 1.0 K/uL   Eosinophils Relative 9 (H) 0 - 5 %   Eosinophils Absolute 0.5 0.0 - 0.7 K/uL   Basophils Relative 0 0 - 1 %   Basophils Absolute 0.0 0.0 - 0.1 K/uL  Basic metabolic panel     Status: Abnormal   Collection Time: 12/11/14  5:30 AM  Result Value Ref Range   Sodium 138 135 - 145 mmol/L   Potassium 3.1 (L) 3.5 - 5.1 mmol/L    Comment: RESULT REPEATED AND VERIFIED DELTA CHECK NOTED NO VISIBLE HEMOLYSIS    Chloride 102 101 - 111 mmol/L   CO2 30 22 - 32 mmol/L   Glucose, Bld 115 (H) 70 - 99 mg/dL   BUN 9 6 - 20 mg/dL   Creatinine, Ser 0.66 0.44 - 1.00 mg/dL   Calcium 8.3 (L) 8.9 - 10.3 mg/dL   GFR calc non Af Amer >60 >60 mL/min   GFR calc Af Amer >60 >60 mL/min    Comment: (NOTE) The eGFR has been calculated using the CKD EPI equation. This calculation has not been validated in all clinical situations. eGFR's persistently <60 mL/min signify possible Chronic Kidney Disease.    Anion gap 6 5 - 15  Magnesium     Status: Abnormal   Collection Time: 12/11/14  5:30 AM  Result Value Ref Range   Magnesium 1.3 (L) 1.7 - 2.4 mg/dL  Glucose, capillary     Status: Abnormal   Collection Time: 12/11/14  7:33 AM  Result Value Ref  Range   Glucose-Capillary 144 (H) 70 - 99 mg/dL   Comment 1 Notify RN   Magnesium, urine     Status: None   Collection Time: 12/11/14  9:29 AM  Result Value Ref Range   Magnesium, Ur 3.2 mg/dL    Comment: Performed at [facility]  Creatinine, urine, random     Status: None   Collection Time: 12/11/14  9:29 AM  Result Value Ref Range   Creatinine, Urine 104.8 mg/dL    Comment: No reference range established. Performed at [facility]   Glucose, capillary     Status: Abnormal   Collection Time: 12/11/14 12:03 PM  Result Value Ref Range   Glucose-Capillary 170 (H) 70 - 99 mg/dL   Comment 1 Notify RN   Glucose, capillary     Status: Abnormal   Collection Time: 12/11/14  4:52 PM  Result Value Ref Range   Glucose-Capillary 119 (H) 70 - 99 mg/dL   Comment 1 Notify RN   Glucose, capillary     Status: Abnormal   Collection Time: 12/11/14  9:13 PM  Result Value Ref Range   Glucose-Capillary 131 (H) 70 - 99 mg/dL   Comment 1 Notify RN   CBC with Differential     Status: Abnormal   Collection Time: 12/12/14  6:20 AM  Result Value Ref Range   WBC 4.0 4.0 - 10.5 K/uL   RBC 3.11 (L) 3.87 - 5.11 MIL/uL   Hemoglobin 8.2 (L) 12.0 - 15.0 g/dL   HCT 27.0 (L) 36.0 - 46.0 %   MCV 86.8 78.0 - 100.0 fL   MCH 26.4 26.0 - 34.0 pg   MCHC 30.4 30.0 - 36.0 g/dL   RDW 16.3 (H) 11.5 - 15.5 %  Platelets 318 150 - 400 K/uL   Neutrophils Relative % 48 43 - 77 %   Neutro Abs 1.9 1.7 - 7.7 K/uL   Lymphocytes Relative 25 12 - 46 %   Lymphs Abs 1.0 0.7 - 4.0 K/uL   Monocytes Relative 17 (H) 3 - 12 %   Monocytes Absolute 0.7 0.1 - 1.0 K/uL   Eosinophils Relative 9 (H) 0 - 5 %   Eosinophils Absolute 0.4 0.0 - 0.7 K/uL   Basophils Relative 1 0 - 1 %   Basophils Absolute 0.0 0.0 - 0.1 K/uL  Glucose, capillary     Status: Abnormal   Collection Time: 12/12/14  7:26 AM  Result Value Ref Range   Glucose-Capillary 121 (H) 65 - 99 mg/dL   Comment 1 Notify RN    Comment 2 Document in Chart      Dg Cystogram  12/11/2014   CLINICAL DATA:  59 year old female status post bladder repair and tumor removal 3 weeks ago.  EXAM: CYSTOGRAM  TECHNIQUE: After catheterization of the urinary bladder following sterile technique the bladder was filled with 200 mL Cysto-Hypaque 30% by drip infusion. Serial spot images were obtained during bladder filling and post draining.  FLUOROSCOPY TIME:  3 minutes and 1 second  COMPARISON:  No priors.  FINDINGS: Initial images of the pelvis demonstrated the distal ends of bilateral double-J ureteral stents, and demonstrated multiple surgical clips. Urinary bladder was normal in appearance, without evidence of extravasation at any point during the examination.  IMPRESSION: 1. Normal cystogram, without evidence of extravasation into the peritoneal or subperitoneal spaces.   Electronically Signed   By: Vinnie Langton M.D.   On: 12/11/2014 12:11    Discharge Exam: Blood pressure 136/56, pulse 71, temperature 98.1 F (36.7 C), temperature source Oral, resp. rate 20, height 5' 2"  (1.575 m), weight 118.389 kg (261 lb), SpO2 97 %.  General: Pt awake/alert/oriented x4 in no major acute distress Eyes: PERRL, normal EOM. Sclera nonicteric Neuro: CN II-XII intact w/o focal sensory/motor deficits. Lymph: No head/neck/groin lymphadenopathy Psych:  No delerium/psychosis/paranoia HENT: Normocephalic, Mucus membranes moist.  No thrush Neck: Supple, No tracheal deviation Chest: No pain.  Good respiratory excursion. CV:  Pulses intact.  Regular rhythm MS: Normal AROM mjr joints.  No obvious deformity Abdomen: Soft, Nondistended.  Min tender left abdomen.  No incarcerated hernias.  Normal healing rigid colostomy without cellulitis.  Colostomy pink with gas and paste consistency stool. Ext:  SCDs BLE.  No significant edema.  No cyanosis Skin: No petechiae / purpura  Discharged Condition: good   Past Medical History  Diagnosis Date  . Hypothyroidism 12/14/2011  . Benign  essential HTN 12/14/2011  . Neutropenia   . Postmenopausal bleeding 09/05/2014  . Anemia     Planned Neupogen injections for chronic neutropenia  . Transfusion history     '06-s/p hysterectomy and staging  . Multiple thyroid nodules y-8    Managed by Dr. Harlow Asa  . GERD (gastroesophageal reflux disease)   . Polyp of duodenum y-2  . DM type 2 (diabetes mellitus, type 2) 12/14/2011  . Dysrhythmia     skipped beat with occasional coffee   . Hx of bronchitis   . Depression   . Hiatal hernia y-12    hx of small hiatal hernia   . Neutropenia     chronic   . Cancer     '06-Ovarian/ Uterine Cancer-chemotherapy  . 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 Appendectomy    . Tonsillectomy    . Cholecystectomy      laparoscopic  . Eye surgery      pytosis of eyelids-child  . Colonoscopy with propofol N/A 08/21/2013    Procedure: COLONOSCOPY WITH PROPOFOL;  Surgeon: Cleotis Nipper, MD;  Location: [facility];  Service: Endoscopy;  Laterality: N/A;  . Abdominal hysterectomy      staging for Ovarian cancer  . Eus N/A 10/02/2014    Procedure: LOWER ENDOSCOPIC ULTRASOUND (EUS);  Surgeon: Arta Silence, MD;  Location: [facility];  Service: Endoscopy;  Laterality: N/A;  . Robotic assisted lap vaginal hysterectomy N/A 11/19/2014    Procedure: ROBOTIC LYSIS OF ADHESIONS, CONVERTED TO LAPAROTOMY RADICAL UPPER VAGINECTOMY,LOW ANTERIOR BOWEL RESECTION, COLOSTOMY, BILATERAL URETERAL STENT PLACEMENT AND CYSTONOMY CLOSURE;  Surgeon: Everitt Amber, MD;  Location: [facility];  Service: Gynecology;  Laterality: N/A;  . Ostomy N/A 11/19/2014    Procedure: OSTOMY;  Surgeon: Michael [name], MD;  Location: [facility];  Service: General;  Laterality: N/A;  . Colostomy takedown N/A 12/04/2014    Procedure: LAPROSCOPIC LYSIS OF ADHESIONS, SPLENIC MOBILIZATION, RELOCATION OF COLOSTOMY, DEBRIDEMENT INITIAL COLOSTOMY SITE;  Surgeon: Michael [name], MD;  Location: [facility];  Service:  General;  Laterality: N/A;    History   Social History  . Marital Status: Married    Spouse Name: N/A  . Number of Children: N/A  . Years of Education: N/A   Occupational History  . Not on file.   Social History Main Topics  . Smoking status: Never Smoker   . Smokeless tobacco: Never Used  . Alcohol Use: Yes     Comment: rare social  . Drug Use: No  . Sexual Activity: Not Currently   Other Topics Concern  . Not on file   Social History Narrative    Family History  Problem Relation Age of Onset  . Cancer Mother     stomach ca  . Hypertension Mother   . Cancer Father     prostate ca  . Diabetes Father   . Diabetes Sister   . Hypertension Brother y-10    Current Facility-Administered Medications  Medication Dose Route Frequency Provider Last Rate Last Dose  . acetaminophen (TYLENOL) tablet 1,000 mg  1,000 mg Oral 4 times per day Michael [name], MD   1,000 mg at 12/12/14 3419  . alum & mag hydroxide-simeth (MAALOX/MYLANTA) 200-200-20 MG/5ML suspension 30 mL  30 mL Oral Q6H PRN Michael [name], MD      . enoxaparin (LOVENOX) injection 40 mg  40 mg Subcutaneous Q24H Everitt Amber, MD   40 mg at 12/11/14 1035  . feeding supplement (GLUCERNA SHAKE) (GLUCERNA SHAKE) liquid 237 mL  237 mL Oral TID BM Lahoma Crocker, MD   237 mL at 12/07/14 1300  . ferrous sulfate tablet 325 mg  325 mg Oral BID WC Everitt Amber, MD   325 mg at 12/12/14 0735  . hydrochlorothiazide (MICROZIDE) capsule 12.5 mg  12.5 mg Oral q morning - 10a Everitt Amber, MD   12.5 mg at 12/11/14 1000  . HYDROmorphone (DILAUDID) injection 0.5-2 mg  0.5-2 mg Intravenous Q2H PRN Michael [name], MD   2 mg at 12/11/14 1006  . insulin aspart (novoLOG) injection 0-20 Units  0-20 Units Subcutaneous TID WC Michael [name], MD   4 Units at 12/11/14 1316  . irbesartan (AVAPRO) tablet 300 mg  300 mg Oral Daily Everitt Amber, MD   300 mg at 12/11/14 1000  . lactated ringers  infusion   Intravenous Continuous Michael [name], MD 50 mL/hr at  12/10/14 2112    . levothyroxine (SYNTHROID, LEVOTHROID) tablet 175 mcg  175 mcg Oral QAC breakfast Everitt Amber, MD   175 mcg at 12/12/14 0735  . lip balm (CARMEX) ointment 1 application  1 application Topical BID Michael [name], MD   1 application at 27/25/36 2200  . magic mouthwash  15 mL Oral QID PRN Michael [name], MD      . magnesium oxide (MAG-OX) tablet 400 mg  400 mg Oral BID Michael [name], MD   400 mg at 12/11/14 2224  . menthol-cetylpyridinium (CEPACOL) lozenge 3 mg  1 lozenge Oral PRN Michael [name], MD      . omega-3 acid ethyl esters (LOVAZA) capsule 1 g  1 g Oral BID Everitt Amber, MD   1 g at 12/11/14 2224  . ondansetron (ZOFRAN) 8 mg in sodium chloride 0.9 % 50 mL IVPB  8 mg Intravenous 3 times per day Everitt Amber, MD   8 mg at 12/12/14 6440  . ondansetron (ZOFRAN) tablet 4 mg  4 mg Oral Q6H PRN Everitt Amber, MD      . oxyCODONE (Oxy IR/ROXICODONE) immediate release tablet 5-10 mg  5-10 mg Oral Q4H PRN Everitt Amber, MD   10 mg at 12/11/14 2224  . phenol (CHLORASEPTIC) mouth spray 2 spray  2 spray Mouth/Throat PRN Michael [name], MD      . polyethylene glycol (MIRALAX / GLYCOLAX) packet 17 g  17 g Oral Q12H PRN Michael [name], MD      . polyethylene glycol (MIRALAX / GLYCOLAX) packet 17 g  17 g Oral BID Michael [name], MD   17 g at 12/11/14 2225  . polyvinyl alcohol (LIQUIFILM TEARS) 1.4 % ophthalmic solution 1 drop  1 drop Both Eyes Daily PRN Michael [name], MD      . potassium chloride SA (K-DUR,KLOR-CON) CR tablet 40 mEq  40 mEq Oral BID Michael [name], MD   40 mEq at 12/11/14 2224  . prenatal multivitamin tablet 1 tablet  1 tablet Oral Q1200 Everitt Amber, MD   1 tablet at 12/11/14 1801  . prochlorperazine (COMPAZINE) injection 10 mg  10 mg Intravenous H4V PRN Leighton Ruff, MD   10 mg at 12/09/14 1234  . rosuvastatin (CRESTOR) tablet 10 mg  10 mg Oral QPM Everitt Amber, MD   10 mg at 12/11/14 1801  . saccharomyces boulardii (FLORASTOR) capsule 250 mg  250 mg Oral BID Michael [name], MD   250 mg at 12/11/14 2224   . sodium chloride 0.9 % injection 10-40 mL  10-40 mL Intracatheter Q12H Everitt Amber, MD   20 mL at 12/11/14 2225  . sodium chloride 0.9 % injection 10-40 mL  10-40 mL Intracatheter PRN Everitt Amber, MD      . Tbo-Filgrastim [facility]) injection 480 mcg  480 mcg Subcutaneous q1800 Heath Lark, MD   Stopped at 12/07/14 1800  . zolpidem (AMBIEN) tablet 5 mg  5 mg Oral QHS PRN Everitt Amber, MD       Facility-Administered Medications Ordered in Other Encounters  Medication Dose Route Frequency Provider Last Rate Last Dose  . Tbo-Filgrastim (GRANIX) injection 480 mcg  480 mcg Subcutaneous Weekly Heath Lark, MD   480 mcg at 11/29/14 1338     Allergies  Allergen Reactions  . Penicillins Swelling    Facial swelling  . Ultram [Tramadol] Hives  . Adhesive [Tape]     blisters  . Cefaclor Rash    Ceclor  .  Erythromycin     Gastritis, abd cramps  . Trimethoprim Rash  . Sulfa Antibiotics Rash    Disposition: 06-Home-Health Care Svc  Discharge Instructions    Call MD for:  extreme fatigue    Complete by:  As directed      Call MD for:  hives    Complete by:  As directed      Call MD for:  persistant nausea and vomiting    Complete by:  As directed      Call MD for:  redness, tenderness, or signs of infection (pain, swelling, redness, odor or green/yellow discharge around incision site)    Complete by:  As directed      Call MD for:  severe uncontrolled pain    Complete by:  As directed      Call MD for:    Complete by:  As directed   Temperature > 101.69F     Diet - low sodium heart healthy    Complete by:  As directed      Discharge instructions    Complete by:  As directed   Please see discharge instruction sheets.  Also refer to handout given an office.  Please call our office if you have any questions or concerns [phone]     Discharge wound care:    Complete by:  As directed   You have closed incisions, shower and bathe over these incisions with soap and water every day.  Remove  all surgical dressings on postoperative day #3.  You do not need to replace dressings over the closed incisions unless you feel more comfortable with a Band-Aid covering it.   You have an open wound that requires wound vac or packing, please see wound vac & wound care instructions.  Try to use wound vac MWFriday 3x a week to help close the old colostomy wound down faster In general, remove all dressings, wash wound with soap and water and then replace with saline moistened gauze.  Do the dressing change at least every day.  Please call our office [phone] if you have further questions.     Driving Restrictions    Complete by:  As directed   No driving until off narcotics and can safely swerve away without pain during an emergency     Increase activity slowly    Complete by:  As directed   Walk an hour a day.  Use 20-30 minute walks.  When you can walk 30 minutes without difficulty, increase to low impact/moderate activities such as biking, jogging, swimming, sexual activity..  Eventually can increase to unrestricted activity when not feeling pain.  If you feel pain: STOP![name] Kitchen   Let pain protect you from overdoing it.  Use ice/heat/over-the-counter pain medications to help minimize his soreness.  Use pain prescriptions as needed to remain active.  It is better to take extra pain medications and be more active than to stay bedridden to avoid all pain medications.     Lifting restrictions    Complete by:  As directed   Avoid heavy lifting initially.  Do not push through pain.  You have no specific weight limit.  Coughing and sneezing or four more stressful to your incision than any lifting you will do. Pain will protect you from injury.  Therefore, avoid intense activity until off all narcotic pain medications.  Coughing and sneezing or four more stressful to your incision than any lifting he will do.     May shower / Bathe  Complete by:  As directed      May walk up steps    Complete by:  As  directed      Sexual Activity Restrictions    Complete by:  As directed   Sexual activity as tolerated.  Do not push through pain.  Pain will protect you from injury.     Walk with assistance    Complete by:  As directed   Walk over an hour a day.  May use a walker/cane/companion to help with balance and stamina.            Medication List    STOP taking these medications        valsartan 320 MG tablet  Commonly known as:  DIOVAN      TAKE these medications        acetaminophen 500 MG tablet  Commonly known as:  TYLENOL  Take 2 tablets (1,000 mg total) by mouth 3 (three) times daily.     CALCIUM + D PO  Take 2 tablets by mouth daily.     docusate sodium 100 MG capsule  Commonly known as:  COLACE  Take 100 mg by mouth daily as needed for moderate constipation.     enoxaparin 40 MG/0.4ML injection  Commonly known as:  LOVENOX  Inject 0.4 mLs (40 mg total) into the skin daily.     ferrous sulfate 325 (65 FE) MG tablet  Take 1 tablet (325 mg total) by mouth 2 (two) times daily with a meal.     hydrochlorothiazide 12.5 MG tablet  Commonly known as:  HYDRODIURIL  Take 12.5 mg by mouth every morning.     ibuprofen 800 MG tablet  Commonly known as:  ADVIL,MOTRIN  Take 800 mg by mouth every 8 (eight) hours as needed for fever or moderate pain.     INVOKANA 300 MG Tabs tablet  Generic drug:  canagliflozin  Take 1 tablet by mouth every morning.     magnesium oxide 400 (241.3 MG) MG tablet  Commonly known as:  MAG-OX  Take 1 tablet (400 mg total) by mouth 2 (two) times daily.     metFORMIN 1000 MG tablet  Commonly known as:  GLUCOPHAGE  Take 1,000 mg by mouth 2 (two) times daily with a meal.     multivitamin with minerals Tabs tablet  Take 1 tablet by mouth daily.     omega-3 acid ethyl esters 1 G capsule  Commonly known as:  LOVAZA  Take 1 g by mouth 2 (two) times daily.     omeprazole 20 MG capsule  Commonly known as:  PRILOSEC  Take 20 mg by mouth daily.      ondansetron 4 MG tablet  Commonly known as:  ZOFRAN  Take 1 tablet (4 mg total) by mouth every 6 (six) hours as needed for nausea.     ONETOUCH VERIO test strip  Generic drug:  glucose blood     oxyCODONE 5 MG immediate release tablet  Commonly known as:  Oxy IR/ROXICODONE  Take 1-2 tablets (5-10 mg total) by mouth every 4 (four) hours as needed for moderate pain, severe pain or breakthrough pain.     potassium chloride SA 20 MEQ tablet  Commonly known as:  K-DUR,KLOR-CON  Take 2 tablets (40 mEq total) by mouth daily.     rosuvastatin 10 MG tablet  Commonly known as:  CRESTOR  Take 10 mg by mouth every evening.     sitaGLIPtin 100 MG tablet  Commonly known as:  JANUVIA  Take 100 mg by mouth daily.     SYNTHROID 175 MCG tablet  Generic drug:  levothyroxine  Take 175 mcg by mouth daily before breakfast.     SYSTANE OP  Apply 1 drop to eye daily as needed (Dry eyes).     Tbo-Filgrastim 480 MCG/0.8ML Sosy injection  Commonly known as:  GRANIX  Inject 480 mcg into the skin once a week.     Vitamin D3 10000 UNITS capsule  Take 10,000 Units by mouth once a week. Sundays           Follow-up Information    Follow up with Sutton Plake C., MD. Schedule an appointment as soon as possible for a visit in 2 weeks.   Specialty:  General Surgery   Why:  To follow up after your operation, To follow up after your hospital stay, To have your wound re-checked   Contact information:   [place] [address] [phone]       Follow up with Donaciano Eva, MD. Schedule an appointment as soon as possible for a visit in 1 month.   Specialty:  Obstetrics and Gynecology   Why:  To follow up after your operation.  See if ureteral stents in the bladder can be removed   Contact information:   [place] [address] [phone]        Signed: Morton Peters, M.D., F.A.C.S. Gastrointestinal and Minimally Invasive  Surgery [facility] [address], [place], [place] [address] [phone] Main / Paging   12/12/2014, 7:53 AM

## 2014-12-12 NOTE — Progress Notes (Signed)
8 Days Post-Op Procedure(s) (LRB): LAPROSCOPIC LYSIS OF ADHESIONS, SPLENIC MOBILIZATION, RELOCATION OF COLOSTOMY, DEBRIDEMENT INITIAL COLOSTOMY SITE (N/A) By Dr. Michael [name]  HPI: [name] is a 59 y.o. 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 She then underwent a posterior supralevator exenteration with colostomy and bladder repair and stent placement on [id number] without complications. Her postoperative course was uncomplicate with the exception of development of postop anemia. Her final pathology revealed endometrioid adenocarcinoma invading the vagina and rectum with negative margins on the specimen. The margin had been close (clinically) to the right pelvic sidewall which was m.  Postoperatively she developed cellulitis lateral to the colostomy site. She was admitted to the hospital on 12/02/14 but showed minimal improvement with IV antibiotics. CT abdo/pelvis on 12/02/14 showed no abscess or apparent fistula. 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 The patient was taken back to the OR on 12/04/14 for laparoscopic colostomy revision and repositioning in the upper abdomen. The prior colostomy site was kept open at the subQ with wet to dry dressings. A wound vac was applied on POD 2.  Subjective: Patient reports being ready for discharge.  She saw Dr. Johney Maine and Dr. Delsa Sale this am.  Tolerating diet with no nausea or emesis.  Colostomy functioning but patient concerned about hardened area to the left of the stoma but states Dr. Johney Maine is aware.  Asking about having PICC removed and having home wound VAC applied.  Stating foley catheter was removed yesterday afternoon and she has been voiding without difficulty.  Concerned about not having enough pain medication at home but reassured since  Dr. Johney Maine wrote her for #40 oxycodone tablets.  Ambulating without difficulty but reporting moderate BLE edema.  No other concerns voiced.  Objective: Vital signs in last 24 hours: Temp:  [97.7 F (36.5 C)-98.6 F (37 C)] 98.1 F (36.7 C) (05/12 0640) Pulse Rate:  [71-76] 71 (05/12 0640) Resp:  [18-20] 20 (05/12 0640) BP: (110-136)/(50-56) 136/56 mmHg (05/12 0640) SpO2:  [96 %-100 %] 97 % (05/12 0640) Weight:  [261 lb (118.389 kg)] 261 lb (118.389 kg) (05/12 0640) Last BM Date: 12/11/14  Intake/Output from previous day: 05/11 0701 - 05/12 0700 In: -  Out: 3475 [Urine:3475]  Physical Examination: General: alert, cooperative and no distress Resp: clear to auscultation bilaterally Cardio: regular rate and rhythm, S1, S2 normal, no murmur, click, rub or gallop GI: incision: wound vac in place to suction, dressing intact, midline incision healing from previous surgery, lap sites without drainage or erythema and abdomen obese, active bowel sounds, firmness palpated to the left of the stoma, no erythema at the site Extremities: 2+ pitting edema bilaterally, no cyanosis  Labs: WBC/Hgb/Hct/Plts:  4.0/8.2/27.0/318 (05/12 [id number])    Assessment: 59 y.o. s/p Procedure(s): LAPROSCOPIC LYSIS OF ADHESIONS, SPLENIC MOBILIZATION, RELOCATION OF COLOSTOMY, DEBRIDEMENT INITIAL COLOSTOMY SITE: stable, progressing well and ready for discharge Pain:  Pain is well-controlled on PRN medications.  Heme: Hgb 8.2 and Hct 27.0 this am: stable  ID: Peri-stomal infection/cellulitis--clinically improving: IV antibiotics given inpatient.   CV:  BP and HR stable.  Hx HTN: HCTZ ordered.  GI:  Tolerating po: Yes.  Colostomy functioning.  GU: Cystogram on 12/11/14 negative for extravasation of contrast.  Foley removed yesterday with no issues voiding.    FEN:  Stable at discharge  Endo: Diabetes mellitus Type II, under good  control..  CBG:  CBG (last 3)   Recent Labs  12/11/14 2113 12/12/14 0726  12/12/14 1125  GLUCAP 131* 121* 136*    Prophylaxis: pharmacologic prophylaxis (with any of the following: enoxaparin (Lovenox) 40mg  SQ 2 hours prior to surgery then every day) and intermittent pneumatic compression boots.  Plan: [place] Discharged with Wound VAC and HH services Plan for discharge per Dr. Johney Maine and Dr. Delsa Sale  Discharge Summary per Dr. Johney Maine The patient is to be discharged to home.   LOS: 10 days    [name] 12/12/2014, 12:31 PM

## 2014-12-13 ENCOUNTER — Other Ambulatory Visit (HOSPITAL_BASED_OUTPATIENT_CLINIC_OR_DEPARTMENT_OTHER): Payer: BLUE CROSS/BLUE SHIELD

## 2014-12-13 ENCOUNTER — Ambulatory Visit (HOSPITAL_BASED_OUTPATIENT_CLINIC_OR_DEPARTMENT_OTHER): Payer: BLUE CROSS/BLUE SHIELD

## 2014-12-13 ENCOUNTER — Other Ambulatory Visit: Payer: Self-pay | Admitting: Gynecologic Oncology

## 2014-12-13 ENCOUNTER — Ambulatory Visit: Payer: BLUE CROSS/BLUE SHIELD | Admitting: Gynecologic Oncology

## 2014-12-13 VITALS — BP 129/49 | HR 65 | Temp 98.5°F

## 2014-12-13 DIAGNOSIS — D709 Neutropenia, unspecified: Secondary | ICD-10-CM

## 2014-12-13 DIAGNOSIS — C549 Malignant neoplasm of corpus uteri, unspecified: Secondary | ICD-10-CM

## 2014-12-13 DIAGNOSIS — D708 Other neutropenia: Secondary | ICD-10-CM

## 2014-12-13 LAB — TECHNOLOGIST REVIEW

## 2014-12-13 LAB — CBC & DIFF AND RETIC
BASO%: 1 % (ref 0.0–2.0)
Basophils Absolute: 0 10*3/uL (ref 0.0–0.1)
EOS%: 16.9 % — ABNORMAL HIGH (ref 0.0–7.0)
Eosinophils Absolute: 0.5 10*3/uL (ref 0.0–0.5)
HEMATOCRIT: 31.5 % — AB (ref 34.8–46.6)
HEMOGLOBIN: 9.8 g/dL — AB (ref 11.6–15.9)
LYMPH%: 35.8 % (ref 14.0–49.7)
MCH: 26.8 pg (ref 25.1–34.0)
MCHC: 31.1 g/dL — ABNORMAL LOW (ref 31.5–36.0)
MCV: 86.1 fL (ref 79.5–101.0)
MONO#: 0.5 10*3/uL (ref 0.1–0.9)
MONO%: 15.6 % — AB (ref 0.0–14.0)
NEUT#: 0.9 10*3/uL — ABNORMAL LOW (ref 1.5–6.5)
NEUT%: 30.7 % — ABNORMAL LOW (ref 38.4–76.8)
Platelets: 402 10*3/uL — ABNORMAL HIGH (ref 145–400)
RBC: 3.66 10*6/uL — ABNORMAL LOW (ref 3.70–5.45)
RDW: 16.5 % — ABNORMAL HIGH (ref 11.2–14.5)
WBC: 3 10*3/uL — ABNORMAL LOW (ref 3.9–10.3)
lymph#: 1.1 10*3/uL (ref 0.9–3.3)
nRBC: 0 % (ref 0–0)

## 2014-12-13 LAB — RETICULOCYTES (CHCC)
ABS RETIC: 62.6 10*3/uL (ref 19.0–186.0)
RBC.: 3.68 MIL/uL — ABNORMAL LOW (ref 3.87–5.11)
RETIC CT PCT: 1.7 % (ref 0.4–2.3)

## 2014-12-13 MED ORDER — TBO-FILGRASTIM 480 MCG/0.8ML ~~LOC~~ SOSY
480.0000 ug | PREFILLED_SYRINGE | SUBCUTANEOUS | Status: DC
  Administered 2014-12-13: 480 ug via SUBCUTANEOUS
  Filled 2014-12-13: qty 0.8

## 2014-12-13 NOTE — Progress Notes (Signed)
Spoke with patient about ct cysto to evaluate for strictures on June 2 with follow up to see Dr. Denman [name] on June 3.

## 2014-12-16 ENCOUNTER — Telehealth: Payer: Self-pay | Admitting: *Deleted

## 2014-12-16 ENCOUNTER — Ambulatory Visit (INDEPENDENT_AMBULATORY_CARE_PROVIDER_SITE_OTHER): Payer: BLUE CROSS/BLUE SHIELD | Admitting: Internal Medicine

## 2014-12-16 ENCOUNTER — Telehealth: Payer: Self-pay | Admitting: Hematology and Oncology

## 2014-12-16 VITALS — BP 147/65 | HR 73 | Temp 97.7°F | Resp 16 | Ht 62.0 in | Wt 250.0 lb

## 2014-12-16 DIAGNOSIS — I10 Essential (primary) hypertension: Secondary | ICD-10-CM

## 2014-12-16 DIAGNOSIS — E876 Hypokalemia: Secondary | ICD-10-CM | POA: Diagnosis not present

## 2014-12-16 LAB — BASIC METABOLIC PANEL WITH GFR
BUN: 11 mg/dL (ref 6–23)
CALCIUM: 9.2 mg/dL (ref 8.4–10.5)
CO2: 27 mEq/L (ref 19–32)
Chloride: 101 mEq/L (ref 96–112)
Creat: 0.82 mg/dL (ref 0.50–1.10)
GFR, Est African American: >89 mL/min
GFR, Est Non African American: 78 mL/min
Glucose, Bld: 95 mg/dL (ref 70–99)
POTASSIUM: 4.2 meq/L (ref 3.5–5.3)
Sodium: 143 mEq/L (ref 135–145)

## 2014-12-16 NOTE — Progress Notes (Signed)
Patient ID: [name], female   DOB: [birth year], 59 y.o.   MRN: [id number]   [name], is a 59 y.o. female  [id number]  [id number]  DOB - [birth year]  CC:  Chief Complaint  Patient presents with  . Follow-up    post op follow up -thyroid nodule   . Hypertension       HPI: [name] is a 59 y.o. female here today to follow up post hospitalization. Since last seen, pt was diagnosed with Cancer and underwent a rectosigmoid rescetion for recurrent endometrial cancer and later a laparoscopic lysis of adhesions, B/L ureteral stent placement, cystostomy closure and end descending colostomy. She is also under the care of Dr. Denman [name] for the management of her cancer and anticipates needing radiation therapy. She is also following with Dr. Pollyann Savoy for management of Neutropenia.  In terms of her chronic diseases, Dr. Elyse Hsu (Endocrinology) manages her DM, Hyperlipidemia and hypothyroidism.   During her most recent hospitalization she had her Diovan held due to low BP's. She was also found to be hypokalemic and hypomagnesemic.   Patient has No headache, No chest pain, No abdominal pain - No Nausea, No new weakness tingling or numbness, No Cough - SOB.  Allergies  Allergen Reactions  . Penicillins Swelling    Facial swelling  . Ultram [Tramadol] Hives  . Adhesive [Tape]     blisters  . Cefaclor Rash    Ceclor  . Erythromycin     Gastritis, abd cramps  . Trimethoprim Rash  . Sulfa Antibiotics Rash   Past Medical History  Diagnosis Date  . Hypothyroidism 12/14/2011  . Benign essential HTN 12/14/2011  . Neutropenia   . Postmenopausal bleeding 09/05/2014  . Anemia     Planned Neupogen injections for chronic neutropenia  . Transfusion history     '06-s/p hysterectomy and staging  . Multiple thyroid nodules y-8    Managed by Dr. Harlow Asa  . GERD (gastroesophageal reflux disease)   . Polyp of duodenum y-2  . DM type 2 (diabetes mellitus, type 2) 12/14/2011  . Dysrhythmia      skipped beat with occasional coffee   . Hx of bronchitis   . Depression   . Hiatal hernia y-12    hx of small hiatal hernia   . Neutropenia     chronic   . Cancer     '06-Ovarian/ Uterine Cancer-chemotherapy  . 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 acetaminophen (TYLENOL) 500 MG tablet Take 2 tablets (1,000 mg total) by mouth 3 (three) times daily. (Patient taking differently: Take 1,000 mg by mouth every 8 (eight) hours as needed for moderate pain, fever or headache. ) 30 tablet 0  . Calcium Carbonate-Vitamin D (CALCIUM + D PO) Take 2 tablets by mouth daily.    . Canagliflozin (INVOKANA) 300 MG TABS Take 1 tablet by mouth every morning.     . Cholecalciferol (VITAMIN D3) 10000 UNITS capsule Take 10,000 Units by mouth once a week. Sundays    . docusate sodium (COLACE) 100 MG capsule Take 100 mg by mouth daily as needed for moderate constipation.     . enoxaparin (LOVENOX) 40 MG/0.4ML injection Inject 0.4 mLs (40 mg total) into the skin daily. 21 Syringe 1  . ferrous sulfate 325 (65 FE) MG tablet Take 1 tablet (325 mg total) by mouth 2 (two) times daily with a meal. 60 tablet 3  . hydrochlorothiazide (HYDRODIURIL)  12.5 MG tablet Take 12.5 mg by mouth every morning.     [name] Kitchen ibuprofen (ADVIL,MOTRIN) 800 MG tablet Take 800 mg by mouth every 8 (eight) hours as needed for fever or moderate pain.     [name] Kitchen levothyroxine (SYNTHROID) 175 MCG tablet Take 175 mcg by mouth daily before breakfast.    . magnesium oxide (MAG-OX) 400 (241.3 MG) MG tablet Take 1 tablet (400 mg total) by mouth 2 (two) times daily. 30 tablet 1  . metFORMIN (GLUCOPHAGE) 1000 MG tablet Take 1,000 mg by mouth 2 (two) times daily with a meal.     . Multiple Vitamin (MULTIVITAMIN WITH MINERALS) TABS tablet Take 1 tablet by mouth daily.    [name] Kitchen omega-3 acid ethyl esters (LOVAZA) 1 G capsule Take 1 g by mouth 2 (two) times daily.    [name] Kitchen omeprazole  (PRILOSEC) 20 MG capsule Take 20 mg by mouth daily.    . ondansetron (ZOFRAN) 4 MG tablet Take 1 tablet (4 mg total) by mouth every 6 (six) hours as needed for nausea. 20 tablet 0  . ONETOUCH VERIO test strip     . oxyCODONE (OXY IR/ROXICODONE) 5 MG immediate release tablet Take 1-2 tablets (5-10 mg total) by mouth every 4 (four) hours as needed for moderate pain, severe pain or breakthrough pain. 40 tablet 0  . Polyethyl Glycol-Propyl Glycol (SYSTANE OP) Apply 1 drop to eye daily as needed (Dry eyes).     . potassium chloride SA (K-DUR,KLOR-CON) 20 MEQ tablet Take 2 tablets (40 mEq total) by mouth daily. 20 tablet 1  . rosuvastatin (CRESTOR) 10 MG tablet Take 10 mg by mouth every evening.     . sitaGLIPtin (JANUVIA) 100 MG tablet Take 100 mg by mouth daily.    . Tbo-Filgrastim (GRANIX) 480 MCG/0.8ML SOSY injection Inject 480 mcg into the skin once a week.     Current Facility-Administered Medications on File Prior to Visit  Medication Dose Route Frequency Provider Last Rate Last Dose  . Tbo-Filgrastim (GRANIX) injection 480 mcg  480 mcg Subcutaneous Weekly Heath Lark, MD   480 mcg at 11/29/14 1338   Family History  Problem Relation Age of Onset  . Cancer Mother     stomach ca  . Hypertension Mother   . Cancer Father     prostate ca  . Diabetes Father   . Diabetes Sister   . Hypertension Brother y-10   History   Social History  . Marital Status: Married    Spouse Name: N/A  . Number of Children: N/A  . Years of Education: N/A   Occupational History  . Not on file.   Social History Main Topics  . Smoking status: Never Smoker   . Smokeless tobacco: Never Used  . Alcohol Use: Yes     Comment: rare social  . Drug Use: No  . Sexual Activity: Not Currently   Other Topics Concern  . Not on file   Social History Narrative    Review of Systems: Constitutional: Negative for fever, chills, diaphoresis, activity change, appetite change and fatigue. HENT: Negative for ear pain,  nosebleeds, congestion, facial swelling, rhinorrhea, neck pain, neck stiffness and ear discharge.  Eyes: Negative for pain, discharge, redness, itching and visual disturbance. Respiratory: Negative for cough, choking, chest tightness, shortness of breath, wheezing and stridor.  Cardiovascular: Negative for chest pain, palpitations and leg swelling. Gastrointestinal: Negative for abdominal distention. Genitourinary: Negative for dysuria, urgency, frequency, hematuria, flank pain, decreased urine volume, difficulty urinating and dyspareunia.  Musculoskeletal:  Negative for back pain, joint swelling, arthralgia and gait problem. Neurological: Negative for dizziness, tremors, seizures, syncope, facial asymmetry, speech difficulty, weakness, light-headedness, numbness and headaches.  Hematological: Negative for adenopathy. Does not bruise/bleed easily. Psychiatric/Behavioral: Negative for hallucinations, behavioral problems, confusion, dysphoric mood, decreased concentration and agitation.     Objective:    Filed Vitals:   12/16/14 1453  BP: 147/65  Pulse: 73  Temp: 97.7 F (36.5 C)  Resp: 16    Physical Exam: Constitutional: Patient appears well-developed and well-nourished. No distress. Pt is much more well appearing than before. HENT: Normocephalic, atraumatic, External right and left ear normal. Oropharynx is clear and moist.  Eyes: Conjunctivae and EOM are normal. PERRLA, no scleral icterus. Neck: Normal ROM. Neck supple. No JVD. No tracheal deviation. No thyromegaly. CVS: RRR, S1/S2 +, no murmurs, no gallops, no carotid bruit.  Pulmonary: Effort and breath sounds normal, no stridor, rhonchi, wheezes, rales.  Abdominal: Soft. BS +, no distension, ostomy in place and appliance without any evidence of infection. Wound vac in place in LLQ.   Musculoskeletal: Normal range of motion. No edema and no tenderness.  Lymphadenopathy: No lymphadenopathy noted, cervical, inguinal or  axillary Neuro: Alert. Normal reflexes, muscle tone coordination. No cranial nerve deficit. Skin: Skin is warm and dry. No rash noted. Not diaphoretic. No erythema. No pallor. Psychiatric: Normal mood and affect. Behavior, judgment, thought content normal.   Lab Results  Component Value Date   WBC 3.0* 12/13/2014   HGB 9.8* 12/13/2014   HCT 31.5* 12/13/2014   MCV 86.1 12/13/2014   PLT 402* 12/13/2014   Lab Results  Component Value Date   CREATININE 0.66 12/11/2014   BUN 9 12/11/2014   NA 138 12/11/2014   K 3.1* 12/11/2014   CL 102 12/11/2014   CO2 30 12/11/2014    Lab Results  Component Value Date   HGBA1C 6.8* 11/21/2014   Lipid Panel     Component Value Date/Time   CHOL  05/09/2009 0916    75        ATP III CLASSIFICATION:  <200     mg/dL   Desirable  200-239  mg/dL   Borderline High  >=240    mg/dL   High          TRIG 60 11/19/2014 1946   HDL 39* 05/09/2009 0916   CHOLHDL 1.9 05/09/2009 0916   VLDL 9 05/09/2009 0916   LDLCALC  05/09/2009 0916    27        Total Cholesterol/HDL:CHD Risk Coronary Heart Disease Risk Table                     Men   Women  1/2 Average Risk   3.4   3.3  Average Risk       5.0   4.4  2 X Average Risk   9.6   7.1  3 X Average Risk  23.4   11.0        Use the calculated Patient Ratio above and the CHD Risk Table to determine the patient's CHD Risk.        ATP III CLASSIFICATION (LDL):  <100     mg/dL   Optimal  100-129  mg/dL   Near or Above                    Optimal  130-159  mg/dL   Borderline  160-189  mg/dL   High  >190  mg/dL   Very High       Assessment and plan:   1. Hypokalemia - Potassium levels were low at time of discharge. Will check labs today. - BASIC METABOLIC PANEL WITH GFR   2. Benign essential HTN - BP only mildly elevated today. Will not adjust medications as patient recently post-surgical and is still mobilizing fluid. Will continue medications at current doses and re-check in 3 months. (BP  being checked by home health nurses at present).    Return in about 3 months (around 03/18/2015) for HTN, Hypokalemia.  The patient was given clear instructions to go to ER or return to medical center if symptoms don't improve, worsen or new problems develop. The patient verbalized understanding. The patient was told to call to get lab results if they haven't heard anything in the next week.     This note has been created with Surveyor, quantity. Any transcriptional errors are unintentional.    [name] A., MD [place], [place]   12/16/2014, 3:33 PM

## 2014-12-16 NOTE — Telephone Encounter (Signed)
Called patient to check on how she did over the weekend and to see if she is still leaking urine. Spoke with patient's husband and he states that she is no longer having any leaking and she does not feel like she needs to come into the office today for a post void residual check. Told Mr. Lodato to please give [place] a call back if Mrs. Pry develops any additional symptoms or issues - he is agreeable to this and appreciative of the call.

## 2014-12-16 NOTE — Telephone Encounter (Signed)
pt called to changed time on friday...done...pt ok adn aware of new time

## 2014-12-17 ENCOUNTER — Telehealth: Payer: Self-pay | Admitting: Gynecologic Oncology

## 2014-12-17 NOTE — Telephone Encounter (Signed)
Called to check on patient current status.  Patient stating she is leaking urine intermittently if she has not voided in several hours.  She states she had visitors and went about 3.5 to 4 hours without voiding and when she got out of the chair, she was leaking urine.  Advised she would need to void every 1.5 to two hours to prevent damage to the bladder since she is s/p cystotomy.  Verbalizing understanding.  No other symptoms voiced.  Denies hematuria.  Reportable signs and symptoms reviewed.  Our office will touch base with her on Monday to see if she is still leaking urine and we will bring her in for a post void residual check.  If she has over 100 cc left in her bladder, the foley will be replaced per Dr. Denman [name].

## 2014-12-20 ENCOUNTER — Other Ambulatory Visit: Payer: BLUE CROSS/BLUE SHIELD

## 2014-12-20 ENCOUNTER — Other Ambulatory Visit (HOSPITAL_BASED_OUTPATIENT_CLINIC_OR_DEPARTMENT_OTHER): Payer: BLUE CROSS/BLUE SHIELD

## 2014-12-20 ENCOUNTER — Ambulatory Visit (HOSPITAL_BASED_OUTPATIENT_CLINIC_OR_DEPARTMENT_OTHER): Payer: BLUE CROSS/BLUE SHIELD

## 2014-12-20 ENCOUNTER — Encounter: Payer: Self-pay | Admitting: Internal Medicine

## 2014-12-20 ENCOUNTER — Ambulatory Visit: Payer: BLUE CROSS/BLUE SHIELD

## 2014-12-20 VITALS — BP 121/49 | HR 81 | Temp 98.4°F

## 2014-12-20 DIAGNOSIS — D630 Anemia in neoplastic disease: Secondary | ICD-10-CM | POA: Diagnosis not present

## 2014-12-20 DIAGNOSIS — D709 Neutropenia, unspecified: Secondary | ICD-10-CM

## 2014-12-20 DIAGNOSIS — Z8543 Personal history of malignant neoplasm of ovary: Secondary | ICD-10-CM | POA: Diagnosis not present

## 2014-12-20 LAB — CBC & DIFF AND RETIC
BASO%: 1.6 % (ref 0.0–2.0)
Basophils Absolute: 0.1 10*3/uL (ref 0.0–0.1)
EOS%: 10.7 % — AB (ref 0.0–7.0)
Eosinophils Absolute: 0.4 10*3/uL (ref 0.0–0.5)
HCT: 33.2 % — ABNORMAL LOW (ref 34.8–46.6)
HGB: 10 g/dL — ABNORMAL LOW (ref 11.6–15.9)
Immature Retic Fract: 19.6 % — ABNORMAL HIGH (ref 1.60–10.00)
LYMPH#: 0.9 10*3/uL (ref 0.9–3.3)
LYMPH%: 24.9 % (ref 14.0–49.7)
MCH: 26.2 pg (ref 25.1–34.0)
MCHC: 30.1 g/dL — AB (ref 31.5–36.0)
MCV: 86.9 fL (ref 79.5–101.0)
MONO#: 0.8 10*3/uL (ref 0.1–0.9)
MONO%: 23 % — ABNORMAL HIGH (ref 0.0–14.0)
NEUT#: 1.5 10*3/uL (ref 1.5–6.5)
NEUT%: 39.8 % (ref 38.4–76.8)
NRBC: 0 % (ref 0–0)
Platelets: 435 10*3/uL — ABNORMAL HIGH (ref 145–400)
RBC: 3.82 10*6/uL (ref 3.70–5.45)
RDW: 16.3 % — AB (ref 11.2–14.5)
RETIC CT ABS: 111.54 10*3/uL — AB (ref 33.70–90.70)
Retic %: 2.92 % — ABNORMAL HIGH (ref 0.70–2.10)
WBC: 3.7 10*3/uL — AB (ref 3.9–10.3)

## 2014-12-20 MED ORDER — TBO-FILGRASTIM 480 MCG/0.8ML ~~LOC~~ SOSY
480.0000 ug | PREFILLED_SYRINGE | SUBCUTANEOUS | Status: DC
  Administered 2014-12-20: 480 ug via SUBCUTANEOUS
  Filled 2014-12-20: qty 0.8

## 2014-12-24 ENCOUNTER — Telehealth: Payer: Self-pay | Admitting: Gynecologic Oncology

## 2014-12-24 ENCOUNTER — Telehealth: Payer: Self-pay | Admitting: *Deleted

## 2014-12-24 NOTE — Telephone Encounter (Signed)
Returned call to patient.  Patient had called the triage line this am at [facility] with complaints of bleeding.  Mrs. Linderman stating 2 to 3 days ago she noticed light spotting in the lower perineal area/rectal area.  She states it appears to be coming from her vaginal because she did not notice any blood in her urine and with wiping, the bleeding is in the vaginal area.  She states the discharge has an mild odor and she has seen some mucus in it as well.  Denies fever, chills.  She states her urine is cloudy with sediment at times.  She is unsure whether she is emptying her bladder completely.  She states she will void and then cough and a significant amount comes out.  She has an appt to see Dr. Johney Maine tomorrow.  Situation discussed with Dr. Denman [name] who states the patient should monitor the bleeding, call if it increases, and keep her appt with Dr. Johney Maine tomorrow.  Since she has sutures at the vaginal cuff, the light pink spotting could be caused from the sutures dissolving but pt advised to monitor.  [place] RN to be notified to see if she can check a post void residual tomorrow during her visit.  HH RN, Geralyn Flash, # [phone], message left about obtaining in and out cath to test post void residual.     Spoke with Verline Lema, RN with [facility] at 2:37pm.  Due to the spotting and need for I&O cath, it is decided for the patient to come into the office tomorrow for examination of the vag cuff and post void residual check.  Patient notified and will come after appt with Dr. Johney Maine.

## 2014-12-24 NOTE — Telephone Encounter (Signed)
Received call from pt stating she has had vaginal spotting for last 2 days.  Pt would like to talk to Joylene John, NP for further information. Spoke with Maudie Mercury, GYN assistant.  [place] pt's phone number for Lenna Sciara, NP to call pt back. Pt's  Phone   [phone]  ;   Cell     [phone].

## 2014-12-25 ENCOUNTER — Ambulatory Visit: Payer: BLUE CROSS/BLUE SHIELD | Attending: Gynecologic Oncology | Admitting: Gynecologic Oncology

## 2014-12-25 ENCOUNTER — Encounter: Payer: Self-pay | Admitting: Gynecologic Oncology

## 2014-12-25 VITALS — BP 119/66 | HR 72 | Temp 97.5°F | Resp 18 | Ht 62.0 in | Wt 237.5 lb

## 2014-12-25 DIAGNOSIS — Z9049 Acquired absence of other specified parts of digestive tract: Secondary | ICD-10-CM | POA: Diagnosis not present

## 2014-12-25 DIAGNOSIS — Z90722 Acquired absence of ovaries, bilateral: Secondary | ICD-10-CM | POA: Diagnosis not present

## 2014-12-25 DIAGNOSIS — Z8589 Personal history of malignant neoplasm of other organs and systems: Secondary | ICD-10-CM | POA: Insufficient documentation

## 2014-12-25 DIAGNOSIS — Z7189 Other specified counseling: Secondary | ICD-10-CM | POA: Diagnosis not present

## 2014-12-25 DIAGNOSIS — E039 Hypothyroidism, unspecified: Secondary | ICD-10-CM | POA: Diagnosis not present

## 2014-12-25 DIAGNOSIS — Z90710 Acquired absence of both cervix and uterus: Secondary | ICD-10-CM | POA: Diagnosis not present

## 2014-12-25 DIAGNOSIS — I10 Essential (primary) hypertension: Secondary | ICD-10-CM | POA: Insufficient documentation

## 2014-12-25 DIAGNOSIS — D630 Anemia in neoplastic disease: Secondary | ICD-10-CM | POA: Diagnosis not present

## 2014-12-25 DIAGNOSIS — Z8543 Personal history of malignant neoplasm of ovary: Secondary | ICD-10-CM | POA: Diagnosis not present

## 2014-12-25 DIAGNOSIS — D708 Other neutropenia: Secondary | ICD-10-CM | POA: Diagnosis not present

## 2014-12-25 DIAGNOSIS — K219 Gastro-esophageal reflux disease without esophagitis: Secondary | ICD-10-CM | POA: Diagnosis not present

## 2014-12-25 DIAGNOSIS — Z9221 Personal history of antineoplastic chemotherapy: Secondary | ICD-10-CM | POA: Insufficient documentation

## 2014-12-25 DIAGNOSIS — C541 Malignant neoplasm of endometrium: Secondary | ICD-10-CM | POA: Diagnosis not present

## 2014-12-25 DIAGNOSIS — Z7901 Long term (current) use of anticoagulants: Secondary | ICD-10-CM | POA: Diagnosis not present

## 2014-12-25 DIAGNOSIS — Z933 Colostomy status: Secondary | ICD-10-CM | POA: Diagnosis not present

## 2014-12-25 DIAGNOSIS — E119 Type 2 diabetes mellitus without complications: Secondary | ICD-10-CM | POA: Insufficient documentation

## 2014-12-25 DIAGNOSIS — Z48816 Encounter for surgical aftercare following surgery on the genitourinary system: Secondary | ICD-10-CM | POA: Diagnosis present

## 2014-12-25 DIAGNOSIS — Z79899 Other long term (current) drug therapy: Secondary | ICD-10-CM | POA: Insufficient documentation

## 2014-12-25 NOTE — Progress Notes (Signed)
Endometrial cancer FOLLOWUP VISIT  Assessment:    59 y.o. year old with recurrent endometrioid endometrial/ovarian cancer.   S/p exploratory laparotomy, posterior supralevator pelvic exenteration, end colostomy, bladder repair, ureteral stenting with complete resection and negative margins on 11/09/14. 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  Plan: 1) Pathology again reports reviewed today 2) Treatment counseling - She has negative margins on her specimen. We are recommending adjuvant radiation to consolidate pelvic control and reduce risks for local recurrence. 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 She was given the opportunity to ask questions, which were answered to her satisfaction, and she is agreement with the above mentioned plan of care. We would plan on initiating radiation no sooner than 6 weeks postop or after complete healing of bladder. Referral made to Dr Sondra Come for consideration of external beam radiation. 3) postoperative anemia - stable, on FeSO4. Asymptomatic 4) neutropenia - Dr Alvy Bimler is managing this and we appreciate. 5) peristomal cellulitis-resolved. Wound vac removed from original stomal site and now undergoing more simple dressing. 6)  GU function - no evidene of urinary retention on today's exam. Cystogram showed in tact bladder 2 weeks ago (s/p foley). Urogram scheduled for next week. If no ureteral stricture, will plan for patient to go to OR for ureteral stent removal.   HPI:  [name] is a 59 y.o. 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  She has a  history of endometrial and ovarian endometrioid carcinoma treated in 2006 by Dr. [name] at [facility] in [place]. Her surgery (TAH, BSO) was followed by adjuvant chemotherapy with carboplatin plus paclitaxel due to the ovarian involvement and the presence of a cul de sac lesion also positive for disease. She denies receiving adjuvant radiation therapy. She is unclear if she had metastatic endometrial cancer to the ovary or duel primaries. She had a complete response to therapy however developed leukopenia in July 2010. 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 She sees Dr. Alvy Bimler for this and is treated with G-CSF injections.  She began experiencing rectal pain approximately in January 2016. She also reports narrowing of caliber of the stool. She denies hematochezia. She does report approximately 3 months of vaginal spotting. She's had no specific follow-up for her gynecologic cancers in the past 4 years.  As part of workup of her rectal pain she underwent a CT scan of the abdomen and pelvis on 09/12/2014. This demonstrated a new right perirectal mass abutting the vaginal cuff measuring 3.8 x 4.9 cm. There is a limited fat plane between the mass and the rectum posteriorly. Rectal invasion could not be excluded. There were no other masses identified in the abdomen and pelvis or lymphadenopathy. There is no other evidence of metastatic disease or recurrent disease. There was no hydronephrosis. A CA-125 drawn on 09/12/2014 was normal at 10.  PET was negative for extrapelvic disease. MRI defined the lesion as a 3.5x5cm lesion to the right of the rectum at the vaginal cuff.  Colonoscopy was performed on 10/02/14 with transrectal [place] and biopsy and this revealed endometrioid adenocarcinoma. Of note, the lesion was not  seen within the lumen of the rectum.   She then underwent  a posterior supralevator exenteration with colostomy and bladder repair and stent placement on 04/17/37 without complications.  Her postoperative course was uncomplicate with the exception of development of postop anemia.  Her final pathology revealed endometrioid adenocarcinoma invading the vagina and rectum with negative margins on the specimen. The margin had been close (clinically) to the right pelvic sidewall which was marked with surgical clips.  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 It was treated with IV antibiotics and then on POD 15 she was taken to the OR for laparoscopic revision of the stoma with Dr Michael [name]. Postoperatively she had wound vac and packing for her stomal wound.  A retrograde cystogram on week 4 postop confirmed an intact bladder and the foley was removed.  Since that time she has experienced some decreased sensation to void. She has intermittent small volume passage of slightly blood tinged fluid from the vagina.   Review of systems: Constitutional:  She has no weight gain or weight loss. She has a low grade fever no chills. Eyes: No blurred vision Ears, Nose, Mouth, Throat: No dizziness, headaches or changes in hearing. No mouth sores. Cardiovascular: No chest pain, palpitations or edema. Respiratory:  No shortness of breath, wheezing or cough Gastrointestinal: She has normal bowel movements trhough stoma without diarrhea or constipation. She denies any nausea or vomiting. She denies blood in her stool or heart burn. Genitourinary:  She denies pelvic pain, pelvic pressure or changes in her urinary function. She has no hematuria, dysuria, or incontinence (foley in situ). She has no irregular vaginal bleeding or vaginal discharge Musculoskeletal: Denies muscle weakness or joint pains.  Skin:  She has no skin changes, rashes or itching Neurological:  Denies dizziness or headaches. No  neuropathy, no numbness or tingling. Psychiatric:  She denies depression or anxiety. Hematologic/Lymphatic:   No easy bruising or bleeding  Allergies  Allergen Reactions  . Penicillins Swelling    Facial swelling  . Ultram [Tramadol] Hives  . Adhesive [Tape]     blisters  . Cefaclor Rash    Ceclor  . Erythromycin     Gastritis, abd cramps  . Trimethoprim Rash  . Sulfa Antibiotics Rash    Current Outpatient Prescriptions on File Prior to Visit  Medication Sig Dispense Refill  . acetaminophen (TYLENOL) 500 MG tablet Take 2 tablets (1,000 mg total) by mouth 3 (three) times daily. (Patient taking differently: Take 1,000 mg by mouth every 8 (eight) hours as needed for moderate pain, fever or headache. ) 30 tablet 0  . Calcium Carbonate-Vitamin D (CALCIUM + D PO) Take 2 tablets by mouth daily.    . Canagliflozin (INVOKANA) 300 MG TABS Take 1 tablet by mouth every morning.     . Cholecalciferol (VITAMIN D3) 10000 UNITS capsule Take 10,000 Units by mouth once a week. Sundays    . docusate sodium (COLACE) 100 MG capsule Take 100 mg by mouth daily as needed for moderate constipation.     . enoxaparin (LOVENOX) 40 MG/0.4ML injection Inject 0.4 mLs (40 mg total) into the skin daily. 21 Syringe 1  . ferrous sulfate 325 (65 FE) MG tablet Take 1 tablet (325 mg total) by mouth 2 (two) times daily with a meal. 60 tablet 3  . hydrochlorothiazide (HYDRODIURIL) 12.5 MG tablet Take 12.5 mg by mouth every morning.     [name] Kitchen ibuprofen (ADVIL,MOTRIN)  800 MG tablet Take 800 mg by mouth every 8 (eight) hours as needed for fever or moderate pain.     [name] Kitchen levothyroxine (SYNTHROID) 175 MCG tablet Take 175 mcg by mouth daily before breakfast.    . magnesium oxide (MAG-OX) 400 (241.3 MG) MG tablet Take 1 tablet (400 mg total) by mouth 2 (two) times daily. 30 tablet 1  . metFORMIN (GLUCOPHAGE) 1000 MG tablet Take 1,000 mg by mouth 2 (two) times daily with a meal.     . Multiple Vitamin (MULTIVITAMIN WITH MINERALS) TABS  tablet Take 1 tablet by mouth daily.    [name] Kitchen omega-3 acid ethyl esters (LOVAZA) 1 G capsule Take 1 g by mouth 2 (two) times daily.    [name] Kitchen omeprazole (PRILOSEC) 20 MG capsule Take 20 mg by mouth daily.    . ondansetron (ZOFRAN) 4 MG tablet Take 1 tablet (4 mg total) by mouth every 6 (six) hours as needed for nausea. 20 tablet 0  . ONETOUCH VERIO test strip     . oxyCODONE (OXY IR/ROXICODONE) 5 MG immediate release tablet Take 1-2 tablets (5-10 mg total) by mouth every 4 (four) hours as needed for moderate pain, severe pain or breakthrough pain. 40 tablet 0  . Polyethyl Glycol-Propyl Glycol (SYSTANE OP) Apply 1 drop to eye daily as needed (Dry eyes).     . potassium chloride SA (K-DUR,KLOR-CON) 20 MEQ tablet Take 2 tablets (40 mEq total) by mouth daily. 20 tablet 1  . rosuvastatin (CRESTOR) 10 MG tablet Take 10 mg by mouth every evening.     . sitaGLIPtin (JANUVIA) 100 MG tablet Take 100 mg by mouth daily.    . Tbo-Filgrastim (GRANIX) 480 MCG/0.8ML SOSY injection Inject 480 mcg into the skin once a week.    . valsartan (DIOVAN) 320 MG tablet Take 320 mg by mouth daily.     Current Facility-Administered Medications on File Prior to Visit  Medication Dose Route Frequency Provider Last Rate Last Dose  . Tbo-Filgrastim (GRANIX) injection 480 mcg  480 mcg Subcutaneous Weekly Heath Lark, MD   480 mcg at 11/29/14 1338    Past Medical History  Diagnosis Date  . Hypothyroidism 12/14/2011  . Benign essential HTN 12/14/2011  . Neutropenia   . Postmenopausal bleeding 09/05/2014  . Anemia     Planned Neupogen injections for chronic neutropenia  . Transfusion history     '06-s/p hysterectomy and staging  . Multiple thyroid nodules y-8    Managed by Dr. Harlow Asa  . GERD (gastroesophageal reflux disease)   . Polyp of duodenum y-2  . DM type 2 (diabetes mellitus, type 2) 12/14/2011  . Dysrhythmia     skipped beat with occasional coffee   . Hx of bronchitis   . Depression   . Hiatal hernia y-12    hx of small  hiatal hernia   . Neutropenia     chronic   . Cancer     '06-Ovarian/ Uterine Cancer-chemotherapy  . 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 Appendectomy    . Tonsillectomy    . Cholecystectomy      laparoscopic  . Eye surgery      pytosis of eyelids-child  . Colonoscopy with propofol N/A 08/21/2013    Procedure: COLONOSCOPY WITH PROPOFOL;  Surgeon: Cleotis Nipper, MD;  Location: [facility];  Service: Endoscopy;  Laterality: N/A;  . Abdominal hysterectomy      staging for Ovarian cancer  . Eus N/A  10/02/2014    Procedure: LOWER ENDOSCOPIC ULTRASOUND (EUS);  Surgeon: Arta Silence, MD;  Location: [facility];  Service: Endoscopy;  Laterality: N/A;  . Robotic assisted lap vaginal hysterectomy N/A 11/19/2014    Procedure: ROBOTIC LYSIS OF ADHESIONS, CONVERTED TO LAPAROTOMY RADICAL UPPER VAGINECTOMY,LOW ANTERIOR BOWEL RESECTION, COLOSTOMY, BILATERAL URETERAL STENT PLACEMENT AND CYSTONOMY CLOSURE;  Surgeon: Everitt Amber, MD;  Location: [facility];  Service: Gynecology;  Laterality: N/A;  . Ostomy N/A 11/19/2014    Procedure: OSTOMY;  Surgeon: Michael [name], MD;  Location: [facility];  Service: General;  Laterality: N/A;  . Colostomy takedown N/A 12/04/2014    Procedure: LAPROSCOPIC LYSIS OF ADHESIONS, SPLENIC MOBILIZATION, RELOCATION OF COLOSTOMY, DEBRIDEMENT INITIAL COLOSTOMY SITE;  Surgeon: Michael [name], MD;  Location: [facility];  Service: General;  Laterality: N/A;    Family History  Problem Relation Age of Onset  . Cancer Mother     stomach ca  . Hypertension Mother   . Cancer Father     prostate ca  . Diabetes Father   . Diabetes Sister   . Hypertension Brother y-10    History   Social History  . Marital Status: Married    Spouse Name: N/A  . Number of Children: N/A  . Years of Education: N/A   Occupational History  . Not on file.   Social History Main Topics  . Smoking status: Never Smoker   . Smokeless tobacco: Never Used   . Alcohol Use: Yes     Comment: rare social  . Drug Use: No  . Sexual Activity: Not Currently   Other Topics Concern  . Not on file   Social History Narrative    Physical Exam: Blood pressure 119/66, pulse 72, temperature 97.5 F (36.4 C), temperature source Oral, resp. rate 18, height 5' 2"  (1.575 m), weight 237 lb 8 oz (107.729 kg), SpO2 100 %. General: Well dressed, well nourished in no apparent distress.   HEENT:  Normocephalic and atraumatic, no lesions.  Extraocular muscles intact. Sclerae anicteric. Pupils equal, round, reactive. No mouth sores or ulcers. Thyroid is normal size, not nodular, midline. Skin:  No lesions or rashes. Breasts:  deferred Lungs:  Clear to auscultation bilaterally.  No wheezes. Cardiovascular:  Regular rate and rhythm.  No murmurs or rubs. Abdomen:  Soft, nontender, nondistended.  No palpable masses.  No hepatosplenomegaly.  No ascites. Normal bowel sounds.  No hernias.  Incision is healed. Laparoscopic incision healed. Stomal site (original) packed. Stomal appliance in situ. Genitourinary: vaginal cuff intact without blood or fluid in the vault. Rectal exam unremarkable. Extremities: No cyanosis, clubbing or edema.  No calf tenderness or erythema. No palpable cords. Psychiatric: Mood and affect are appropriate. Neurological: Awake, alert and oriented x 3. Sensation is intact, no neuropathy.  Musculoskeletal: No pain, normal strength and range of motion.  PROCEDURE: Post void residual catheterization Patient spontaneously voided 150cc. She was then catheterized using a sterile technique for approximately 80cc of urine.  Donaciano Eva, MD

## 2014-12-25 NOTE — Patient Instructions (Addendum)
You will receive a phone call from the [facility] about an appointment with Dr. Sondra Come in Radiation Oncology.  Plan to have your cysto as scheduled.

## 2014-12-27 ENCOUNTER — Ambulatory Visit (HOSPITAL_BASED_OUTPATIENT_CLINIC_OR_DEPARTMENT_OTHER): Payer: BLUE CROSS/BLUE SHIELD

## 2014-12-27 ENCOUNTER — Other Ambulatory Visit (HOSPITAL_BASED_OUTPATIENT_CLINIC_OR_DEPARTMENT_OTHER): Payer: BLUE CROSS/BLUE SHIELD

## 2014-12-27 VITALS — BP 101/49 | HR 86 | Temp 98.7°F

## 2014-12-27 DIAGNOSIS — D709 Neutropenia, unspecified: Secondary | ICD-10-CM

## 2014-12-27 LAB — CBC & DIFF AND RETIC
BASO%: 1.9 % (ref 0.0–2.0)
Basophils Absolute: 0.1 10*3/uL (ref 0.0–0.1)
EOS%: 7.5 % — AB (ref 0.0–7.0)
Eosinophils Absolute: 0.3 10*3/uL (ref 0.0–0.5)
HCT: 33.7 % — ABNORMAL LOW (ref 34.8–46.6)
HEMOGLOBIN: 10.4 g/dL — AB (ref 11.6–15.9)
Immature Retic Fract: 9.6 % (ref 1.60–10.00)
LYMPH#: 1 10*3/uL (ref 0.9–3.3)
LYMPH%: 28 % (ref 14.0–49.7)
MCH: 26.7 pg (ref 25.1–34.0)
MCHC: 30.9 g/dL — ABNORMAL LOW (ref 31.5–36.0)
MCV: 86.6 fL (ref 79.5–101.0)
MONO#: 1 10*3/uL — AB (ref 0.1–0.9)
MONO%: 25.6 % — ABNORMAL HIGH (ref 0.0–14.0)
NEUT%: 37 % — AB (ref 38.4–76.8)
NEUTROS ABS: 1.4 10*3/uL — AB (ref 1.5–6.5)
Platelets: 358 10*3/uL (ref 145–400)
RBC: 3.89 10*6/uL (ref 3.70–5.45)
RDW: 16.4 % — ABNORMAL HIGH (ref 11.2–14.5)
Retic %: 1.99 % (ref 0.70–2.10)
Retic Ct Abs: 77.41 10*3/uL (ref 33.70–90.70)
WBC: 3.7 10*3/uL — ABNORMAL LOW (ref 3.9–10.3)

## 2014-12-27 MED ORDER — TBO-FILGRASTIM 480 MCG/0.8ML ~~LOC~~ SOSY
480.0000 ug | PREFILLED_SYRINGE | SUBCUTANEOUS | Status: DC
  Administered 2014-12-27: 480 ug via SUBCUTANEOUS
  Filled 2014-12-27: qty 0.8

## 2014-12-27 NOTE — Progress Notes (Signed)
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 [name] at [facility] in [place]. Her surgery (TAH, BSO) was followed by adjuvant chemotherapy with carboplatin plus paclitaxel due to the ovarian involvement and the presence of a cul de sac lesion also positive for disease. She denies receiving adjuvant radiation therapy. She is unclear if she had metastatic endometrial cancer to the ovary or duel primaries. She had a complete response to therapy however developed leukopenia in July 2010. 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 She sees Dr. Alvy Bimler for this and is treated with G-CSF injections"  Biopsies revealed:   12/04/14 Diagnosis Colon, colostomy stoma - COLOSTOMY WITH PERFORATION, SUPPURATIVE INFLAMMATION AND FOCAL ABSCESS. - NO EVIDENCE OF MALIGNANCY.  11/19/14 Diagnosis Colon, segmental resection for tumor, rectosigmoid colon with upper vagina 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 TWO LYMPH NODES, NEGATIVE FOR ATYPIA OR MALIGNANCY (0/2). RESECTION MARGINS, NEGATIVE FOR ATYPIA OR MALIGNANCY.  Past/Anticipated interventions by Gyn/Onc surgery, if any: 11/19/14 -Procedure: ROBOTIC LYSIS OF ADHESIONS, CONVERTED TO LAPAROTOMY RADICAL UPPER VAGINECTOMY,LOW ANTERIOR BOWEL RESECTION, COLOSTOMY, BILATERAL URETERAL STENT PLACEMENT AND CYSTONOMY CLOSURE;  Surgeon: Everitt Amber, MD;  Location: [facility];  Service: Gynecology;  Laterality: N/A; 11/19/14 -Procedure: OSTOMY;  Surgeon: Michael [name], MD;  Location: [facility];  Service: General;  Laterality: N/A; 12/04/14 - Procedure: LAPROSCOPIC LYSIS OF ADHESIONS, SPLENIC  MOBILIZATION, RELOCATION OF COLOSTOMY, DEBRIDEMENT INITIAL COLOSTOMY SITE;  Surgeon: Michael [name], MD;  Location: [facility];  Service: General;  Laterality: N/A;    Past/Anticipated interventions by medical oncology, if any: Had chemotherapy in 2006.  Patient sees Dr. Alvy Bimler for neutropenia, Per Dr. Denman [name] - "Ideally we would treat with chemotherapy additionally to minimize systemic/distant relapse, however with her chronic idiopathic neutropenia (after previously receiving chemotherapy) I have concerns about the toxicity of this for her, and will consider reserving chemotherapy for situations of second relapse."    Weight changes, if any: patient reports loosing 10 lbs before she was hospitalized.  Bowel/Bladder complaints, if any: has colostomy, reports occasional trouble emptying her bladder.  Had a voiding trial last week.  Has 2 stents.  Nausea/Vomiting, if any: no  Pain issues, if any:  no  SAFETY ISSUES:  Prior radiation? no  Pacemaker/ICD? no  Possible current pregnancy? no  Is the patient on methotrexate? no  Current Complaints / other details:  Patient is here with her husband.  She has a small wound from her original colostomy that she does wet to dry dressings.    Vitals not done because currently an inpatient.

## 2014-12-30 ENCOUNTER — Emergency Department (HOSPITAL_COMMUNITY): Payer: BLUE CROSS/BLUE SHIELD

## 2014-12-30 ENCOUNTER — Inpatient Hospital Stay (HOSPITAL_COMMUNITY)
Admission: EM | Admit: 2014-12-30 | Discharge: 2015-01-02 | DRG: 872 | Disposition: A | Discharge status: Home / Self Care | Payer: BLUE CROSS/BLUE SHIELD | Attending: Internal Medicine | Admitting: Internal Medicine

## 2014-12-30 ENCOUNTER — Encounter (HOSPITAL_COMMUNITY): Payer: Self-pay

## 2014-12-30 DIAGNOSIS — I10 Essential (primary) hypertension: Secondary | ICD-10-CM | POA: Diagnosis present

## 2014-12-30 DIAGNOSIS — Z933 Colostomy status: Secondary | ICD-10-CM

## 2014-12-30 DIAGNOSIS — D649 Anemia, unspecified: Secondary | ICD-10-CM | POA: Diagnosis not present

## 2014-12-30 DIAGNOSIS — N390 Urinary tract infection, site not specified: Secondary | ICD-10-CM | POA: Diagnosis not present

## 2014-12-30 DIAGNOSIS — C569 Malignant neoplasm of unspecified ovary: Secondary | ICD-10-CM | POA: Diagnosis present

## 2014-12-30 DIAGNOSIS — A419 Sepsis, unspecified organism: Principal | ICD-10-CM | POA: Diagnosis present

## 2014-12-30 DIAGNOSIS — D638 Anemia in other chronic diseases classified elsewhere: Secondary | ICD-10-CM | POA: Diagnosis present

## 2014-12-30 DIAGNOSIS — T451X5A Adverse effect of antineoplastic and immunosuppressive drugs, initial encounter: Secondary | ICD-10-CM | POA: Diagnosis present

## 2014-12-30 DIAGNOSIS — F329 Major depressive disorder, single episode, unspecified: Secondary | ICD-10-CM | POA: Diagnosis present

## 2014-12-30 DIAGNOSIS — E876 Hypokalemia: Secondary | ICD-10-CM | POA: Diagnosis present

## 2014-12-30 DIAGNOSIS — D72819 Decreased white blood cell count, unspecified: Secondary | ICD-10-CM | POA: Diagnosis present

## 2014-12-30 DIAGNOSIS — Z9049 Acquired absence of other specified parts of digestive tract: Secondary | ICD-10-CM | POA: Diagnosis not present

## 2014-12-30 DIAGNOSIS — E039 Hypothyroidism, unspecified: Secondary | ICD-10-CM | POA: Diagnosis present

## 2014-12-30 DIAGNOSIS — Z6841 Body Mass Index (BMI) 40.0 and over, adult: Secondary | ICD-10-CM | POA: Diagnosis not present

## 2014-12-30 DIAGNOSIS — C541 Malignant neoplasm of endometrium: Secondary | ICD-10-CM | POA: Diagnosis present

## 2014-12-30 DIAGNOSIS — N12 Tubulo-interstitial nephritis, not specified as acute or chronic: Secondary | ICD-10-CM | POA: Diagnosis present

## 2014-12-30 DIAGNOSIS — Z881 Allergy status to other antibiotic agents status: Secondary | ICD-10-CM

## 2014-12-30 DIAGNOSIS — Z90710 Acquired absence of both cervix and uterus: Secondary | ICD-10-CM

## 2014-12-30 DIAGNOSIS — Z888 Allergy status to other drugs, medicaments and biological substances status: Secondary | ICD-10-CM | POA: Diagnosis not present

## 2014-12-30 DIAGNOSIS — E119 Type 2 diabetes mellitus without complications: Secondary | ICD-10-CM | POA: Diagnosis not present

## 2014-12-30 DIAGNOSIS — K219 Gastro-esophageal reflux disease without esophagitis: Secondary | ICD-10-CM | POA: Diagnosis not present

## 2014-12-30 DIAGNOSIS — Z882 Allergy status to sulfonamides status: Secondary | ICD-10-CM

## 2014-12-30 DIAGNOSIS — Z794 Long term (current) use of insulin: Secondary | ICD-10-CM | POA: Diagnosis not present

## 2014-12-30 DIAGNOSIS — R6520 Severe sepsis without septic shock: Secondary | ICD-10-CM | POA: Diagnosis present

## 2014-12-30 DIAGNOSIS — Z510 Encounter for antineoplastic radiation therapy: Secondary | ICD-10-CM | POA: Diagnosis not present

## 2014-12-30 DIAGNOSIS — Z880 Allergy status to penicillin: Secondary | ICD-10-CM | POA: Diagnosis not present

## 2014-12-30 DIAGNOSIS — E038 Other specified hypothyroidism: Secondary | ICD-10-CM | POA: Diagnosis not present

## 2014-12-30 DIAGNOSIS — R509 Fever, unspecified: Secondary | ICD-10-CM | POA: Diagnosis not present

## 2014-12-30 LAB — COMPREHENSIVE METABOLIC PANEL
ALT: 16 U/L (ref 14–54)
AST: 15 U/L (ref 15–41)
Albumin: 3.2 g/dL — ABNORMAL LOW (ref 3.5–5.0)
Alkaline Phosphatase: 69 U/L (ref 38–126)
Anion gap: 12 (ref 5–15)
BILIRUBIN TOTAL: 0.4 mg/dL (ref 0.3–1.2)
BUN: 21 mg/dL — ABNORMAL HIGH (ref 6–20)
CHLORIDE: 98 mmol/L — AB (ref 101–111)
CO2: 27 mmol/L (ref 22–32)
Calcium: 8.9 mg/dL (ref 8.9–10.3)
Creatinine, Ser: 0.82 mg/dL (ref 0.44–1.00)
GFR calc Af Amer: >60 mL/min (ref >60)
Glucose, Bld: 214 mg/dL — ABNORMAL HIGH (ref 65–99)
POTASSIUM: 2.8 mmol/L — AB (ref 3.5–5.1)
Sodium: 137 mmol/L (ref 135–145)
TOTAL PROTEIN: 7.5 g/dL (ref 6.5–8.1)

## 2014-12-30 LAB — URINE MICROSCOPIC-ADD ON

## 2014-12-30 LAB — CBC WITH DIFFERENTIAL/PLATELET
Basophils Absolute: 0.1 10*3/uL (ref 0.0–0.1)
Basophils Relative: 0 % (ref 0–1)
EOS PCT: 0 % (ref 0–5)
Eosinophils Absolute: 0 10*3/uL (ref 0.0–0.7)
HCT: 31.9 % — ABNORMAL LOW (ref 36.0–46.0)
Hemoglobin: 9.7 g/dL — ABNORMAL LOW (ref 12.0–15.0)
LYMPHS ABS: 0.8 10*3/uL (ref 0.7–4.0)
Lymphocytes Relative: 6 % — ABNORMAL LOW (ref 12–46)
MCH: 25.8 pg — AB (ref 26.0–34.0)
MCHC: 30.4 g/dL (ref 30.0–36.0)
MCV: 84.8 fL (ref 78.0–100.0)
MONO ABS: 1.2 10*3/uL — AB (ref 0.1–1.0)
Monocytes Relative: 9 % (ref 3–12)
NEUTROS PCT: 85 % — AB (ref 43–77)
Neutro Abs: 11.2 10*3/uL — ABNORMAL HIGH (ref 1.7–7.7)
PLATELETS: 347 10*3/uL (ref 150–400)
RBC: 3.76 MIL/uL — ABNORMAL LOW (ref 3.87–5.11)
RDW: 16.2 % — AB (ref 11.5–15.5)
WBC: 13.3 10*3/uL — AB (ref 4.0–10.5)

## 2014-12-30 LAB — URINALYSIS, ROUTINE W REFLEX MICROSCOPIC
Bilirubin Urine: NEGATIVE
Glucose, UA: >1000 mg/dL — AB
Ketones, ur: NEGATIVE mg/dL
Nitrite: POSITIVE — AB
Protein, ur: 30 mg/dL — AB
Specific Gravity, Urine: 1.022 (ref 1.005–1.030)
Urobilinogen, UA: 0.2 mg/dL (ref 0.0–1.0)
pH: 5.5 (ref 5.0–8.0)

## 2014-12-30 LAB — I-STAT CG4 LACTIC ACID, ED: LACTIC ACID, VENOUS: 1.38 mmol/L (ref 0.5–2.0)

## 2014-12-30 MED ORDER — LEVOTHYROXINE SODIUM 175 MCG PO TABS
175.0000 ug | ORAL_TABLET | Freq: Every day | ORAL | Status: DC
  Administered 2014-12-31 – 2015-01-02 (×3): 175 ug via ORAL
  Filled 2014-12-30 (×4): qty 1

## 2014-12-30 MED ORDER — ACETAMINOPHEN 325 MG PO TABS: 650.0000 mg | ORAL_TABLET | Freq: Four times a day (QID) | ORAL | Status: DC | PRN

## 2014-12-30 MED ORDER — CIPROFLOXACIN IN D5W 400 MG/200ML IV SOLN
400.0000 mg | Freq: Once | INTRAVENOUS | Status: AC
  Administered 2014-12-30: 400 mg via INTRAVENOUS
  Filled 2014-12-30: qty 200

## 2014-12-30 MED ORDER — FERROUS SULFATE 325 (65 FE) MG PO TABS
325.0000 mg | ORAL_TABLET | Freq: Two times a day (BID) | ORAL | Status: DC
  Administered 2014-12-31 – 2015-01-02 (×5): 325 mg via ORAL
  Filled 2014-12-30 (×7): qty 1

## 2014-12-30 MED ORDER — ENOXAPARIN SODIUM 40 MG/0.4ML ~~LOC~~ SOLN
40.0000 mg | Freq: Every day | SUBCUTANEOUS | Status: DC
  Filled 2014-12-30 (×4): qty 0.4

## 2014-12-30 MED ORDER — ONDANSETRON HCL 4 MG PO TABS: 4.0000 mg | ORAL_TABLET | Freq: Four times a day (QID) | ORAL | Status: DC | PRN

## 2014-12-30 MED ORDER — ADULT MULTIVITAMIN W/MINERALS CH
1.0000 | ORAL_TABLET | Freq: Every day | ORAL | Status: DC
  Administered 2014-12-31 – 2015-01-02 (×3): 1 via ORAL
  Filled 2014-12-30 (×3): qty 1

## 2014-12-30 MED ORDER — OXYCODONE HCL 5 MG PO TABS: 5.0000 mg | ORAL_TABLET | ORAL | Status: DC | PRN

## 2014-12-30 MED ORDER — INSULIN ASPART 100 UNIT/ML ~~LOC~~ SOLN
0.0000 [IU] | Freq: Three times a day (TID) | SUBCUTANEOUS | Status: DC
Start: 2014-12-31 — End: 2015-01-02
  Administered 2014-12-31 (×2): 2 [IU] via SUBCUTANEOUS
  Administered 2015-01-01 (×2): 1 [IU] via SUBCUTANEOUS
  Administered 2015-01-01: 2 [IU] via SUBCUTANEOUS
  Administered 2015-01-02: 1 [IU] via SUBCUTANEOUS

## 2014-12-30 MED ORDER — METRONIDAZOLE IN NACL 5-0.79 MG/ML-% IV SOLN
500.0000 mg | Freq: Once | INTRAVENOUS | Status: AC
  Administered 2014-12-30: 500 mg via INTRAVENOUS
  Filled 2014-12-30: qty 100

## 2014-12-30 MED ORDER — CANAGLIFLOZIN 300 MG PO TABS
300.0000 mg | ORAL_TABLET | Freq: Every morning | ORAL | Status: DC
  Administered 2014-12-31 – 2015-01-02 (×3): 300 mg via ORAL
  Filled 2014-12-30 (×3): qty 300

## 2014-12-30 MED ORDER — IOHEXOL 300 MG/ML  SOLN
100.0000 mL | Freq: Once | INTRAMUSCULAR | Status: AC | PRN
Start: 2014-12-30 — End: 2014-12-30
  Administered 2014-12-30: 100 mL via INTRAVENOUS

## 2014-12-30 MED ORDER — OMEGA-3-ACID ETHYL ESTERS 1 G PO CAPS
1.0000 g | ORAL_CAPSULE | Freq: Two times a day (BID) | ORAL | Status: DC
  Administered 2014-12-31 – 2015-01-02 (×5): 1 g via ORAL
  Filled 2014-12-30 (×7): qty 1

## 2014-12-30 MED ORDER — ONDANSETRON HCL 4 MG/2ML IJ SOLN: 4.0000 mg | Freq: Four times a day (QID) | INTRAMUSCULAR | Status: DC | PRN

## 2014-12-30 MED ORDER — POTASSIUM CHLORIDE 10 MEQ/100ML IV SOLN
10.0000 meq | INTRAVENOUS | Status: AC
  Administered 2014-12-30 – 2014-12-31 (×5): 10 meq via INTRAVENOUS
  Filled 2014-12-30 (×5): qty 100

## 2014-12-30 MED ORDER — POTASSIUM CHLORIDE IN NACL 20-0.9 MEQ/L-% IV SOLN
INTRAVENOUS | Status: AC
  Administered 2014-12-30: via INTRAVENOUS
  Administered 2014-12-31: 1000 mL via INTRAVENOUS
  Filled 2014-12-30 (×3): qty 1000

## 2014-12-30 MED ORDER — PANTOPRAZOLE SODIUM 40 MG PO TBEC
40.0000 mg | DELAYED_RELEASE_TABLET | Freq: Every day | ORAL | Status: DC
  Administered 2014-12-31 – 2015-01-02 (×3): 40 mg via ORAL
  Filled 2014-12-30 (×3): qty 1

## 2014-12-30 MED ORDER — ROSUVASTATIN CALCIUM 10 MG PO TABS
10.0000 mg | ORAL_TABLET | Freq: Every evening | ORAL | Status: DC
  Administered 2014-12-31 – 2015-01-01 (×2): 10 mg via ORAL
  Filled 2014-12-30 (×3): qty 1

## 2014-12-30 MED ORDER — LINAGLIPTIN 5 MG PO TABS
5.0000 mg | ORAL_TABLET | Freq: Every day | ORAL | Status: DC
  Administered 2014-12-31 – 2015-01-02 (×3): 5 mg via ORAL
  Filled 2014-12-30 (×3): qty 1

## 2014-12-30 MED ORDER — ACETAMINOPHEN 650 MG RE SUPP: 650.0000 mg | Freq: Four times a day (QID) | RECTAL | Status: DC | PRN

## 2014-12-30 NOTE — ED Notes (Signed)
Patient transported to CT 

## 2014-12-30 NOTE — ED Notes (Addendum)
Pt c/o "not feeling well" x 2 days and fever and chills starting today.  Denies pain.  Pt has had several procedures recently and several healing wounds.  Denies signs of infection.  Hx of chronic neutropenia and endometrial, ovarian, and uterine CA.  Pt does not receive chemo or radiation.

## 2014-12-30 NOTE — H&P (Signed)
[facility] History and Physical  [name] [id number] DOB: 1955-11-25 DOA: 12/30/2014  Referring physician: Dr.Pickering. PCP: MATTHEWS,MICHELLE A., MD  Specialists: Dr.Rossi.OB Oncology.                      Dr.Gorsuch. Oncologist.                      Dr.Gross. General Surgeon.  Chief Complaint: Fever and chills.  HPI: [name] is a 59 y.o. female 59 y.o. year old with recurrent endometrioid endometrial/ovarian cancer. 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 Patient received Granix injection 3 days ago. Patient was feeling weak and fatigued following day and patient found her blood pressure was in the low normals for which she stopped taking antihypertensives. Patient started having fever and chills which got worse today to the point that patient had to lie down. Patient had mild nausea denies any vomiting abdominal pain or diarrhea. Denies any chest pain or productive cough. Patient did have some running nose. In the ER CT abdomen and pelvis was done which shows some soft tissue stranding around the colostomy site both the new and old. UA shows features consistent with UTI. Patient has been started on empiric antibiotics after blood cultures and urine cultures were obtained and admitted for further management. Patient's blood pressure initially was in the low normals for which patient was given fluid bolus.  Review of Systems: As presented in the history of presenting illness, rest negative.  Past Medical History  Diagnosis Date  . Hypothyroidism 12/14/2011  . Benign essential HTN 12/14/2011  . Neutropenia   . Postmenopausal bleeding 09/05/2014  . Anemia     Planned Neupogen injections for chronic neutropenia  . Transfusion  history     '06-s/p hysterectomy and staging  . Multiple thyroid nodules y-8    Managed by Dr. Harlow Asa  . GERD (gastroesophageal reflux disease)   . Polyp of duodenum y-2  . DM type 2 (diabetes mellitus, type 2) 12/14/2011  . Dysrhythmia     skipped beat with occasional coffee   . Hx of bronchitis   . Depression   . Hiatal hernia y-12    hx of small hiatal hernia   . Neutropenia     chronic   . Cancer     '06-Ovarian/ Uterine Cancer-chemotherapy  . 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 Appendectomy    . Tonsillectomy    . Cholecystectomy      laparoscopic  . Eye surgery      pytosis of eyelids-child  . Colonoscopy with propofol N/A 08/21/2013    Procedure: COLONOSCOPY WITH PROPOFOL;  Surgeon: Cleotis Nipper, MD;  Location: [facility];  Service: Endoscopy;  Laterality: N/A;  . Abdominal hysterectomy      staging for Ovarian cancer  . Eus N/A 10/02/2014    Procedure: LOWER ENDOSCOPIC ULTRASOUND (EUS);  Surgeon: Arta Silence, MD;  Location: [facility];  Service: Endoscopy;  Laterality: N/A;  . Robotic assisted lap vaginal hysterectomy N/A 11/19/2014    Procedure: ROBOTIC LYSIS OF ADHESIONS, CONVERTED TO LAPAROTOMY RADICAL UPPER VAGINECTOMY,LOW ANTERIOR BOWEL RESECTION, COLOSTOMY, BILATERAL URETERAL STENT PLACEMENT AND CYSTONOMY CLOSURE;  Surgeon: Everitt Amber, MD;  Location: [facility];  Service: Gynecology;  Laterality: N/A;  . Ostomy N/A 11/19/2014    Procedure: OSTOMY;  Surgeon: Michael [name], MD;  Location: [facility];  Service: General;  Laterality: N/A;  . Colostomy takedown N/A 12/04/2014    Procedure: LAPROSCOPIC LYSIS OF ADHESIONS, SPLENIC MOBILIZATION, RELOCATION OF COLOSTOMY, DEBRIDEMENT INITIAL COLOSTOMY SITE;  Surgeon: Michael [name], MD;  Location: [facility];  Service: General;  Laterality: N/A;   Social History:  reports that she has never smoked. She has never used smokeless tobacco. She reports that she drinks alcohol. She  reports that she does not use illicit drugs. Where does patient live at home. Can patient participate in ADLs? Yes.  Allergies  Allergen Reactions  . Penicillins Swelling    Facial swelling  . Ultram [Tramadol] Hives  . Adhesive [Tape]     blisters  . Cefaclor Rash    Ceclor  . Erythromycin     Gastritis, abd cramps  . Trimethoprim Rash  . Sulfa Antibiotics Rash    Family History:  Family History  Problem Relation Age of Onset  . Cancer Mother     stomach ca  . Hypertension Mother   . Cancer Father     prostate ca  . Diabetes Father   . Diabetes Sister   . Hypertension Brother y-10      Prior to Admission medications   Medication Sig Start Date End Date Taking? Authorizing Provider  acetaminophen (TYLENOL) 500 MG tablet Take 2 tablets (1,000 mg total) by mouth 3 (three) times daily. Patient taking differently: Take 1,000 mg by mouth every 8 (eight) hours as needed for moderate pain or fever (pain).  11/25/14  Yes Everitt Amber, MD  Alum & Mag Hydroxide-Simeth (MAGIC MOUTHWASH) SOLN Take 5 mLs by mouth daily as needed for mouth pain (mouth pain).   Yes Historical Provider, MD  Calcium Carbonate-Vitamin D (CALCIUM + D PO) Take 2 tablets by mouth daily.   Yes Historical Provider, MD  Canagliflozin (INVOKANA) 300 MG TABS Take 1 tablet by mouth every morning.    Yes Historical Provider, MD  Cholecalciferol (VITAMIN D3) 10000 UNITS capsule Take 10,000 Units by mouth once a week. Sundays   Yes Historical Provider, MD  ferrous sulfate 325 (65 FE) MG tablet Take 1 tablet (325 mg total) by mouth 2 (two) times daily with a meal. 11/25/14  Yes Everitt Amber, MD  hydrochlorothiazide (HYDRODIURIL) 12.5 MG tablet Take 12.5 mg by mouth every morning.    Yes Historical Provider, MD  ibuprofen (ADVIL,MOTRIN) 800 MG tablet Take 800 mg by mouth every 8 (eight) hours as needed for moderate pain (pain).   Yes Historical Provider, MD  levothyroxine (SYNTHROID) 175 MCG tablet Take 175 mcg by mouth daily  before breakfast.   Yes Historical Provider, MD  metFORMIN (GLUCOPHAGE) 1000 MG tablet Take 1,000 mg by mouth 2 (two) times daily with a meal.    Yes Historical Provider, MD  Multiple Vitamin (MULTIVITAMIN WITH MINERALS) TABS tablet Take 1 tablet by mouth daily.   Yes Historical Provider, MD  omega-3 acid ethyl esters (LOVAZA) 1 G capsule Take 1 g by mouth 2 (two) times daily.   Yes Historical Provider, MD  omeprazole (PRILOSEC) 20 MG capsule Take 20 mg by mouth at bedtime.    Yes Historical Provider, MD  ondansetron (ZOFRAN) 4 MG tablet Take 1 tablet (4 mg total) by mouth every 6 (six) hours as needed for nausea. 11/25/14  Yes Everitt Amber, MD  ONETOUCH VERIO test strip  11/10/14  Yes Historical Provider, MD  oxyCODONE (OXY IR/ROXICODONE) 5 MG immediate release tablet Take 1-2 tablets (5-10 mg total) by mouth every 4 (four) hours as needed for moderate pain, severe pain or breakthrough pain. 12/12/14  Yes Michael [name], MD  Polyethyl Glycol-Propyl Glycol (SYSTANE OP) Apply 1 drop to eye daily as needed (Dry eyes).    Yes Historical Provider, MD  Probiotic Product (PROBIOTIC DAILY PO) Take 1 capsule by mouth daily.   Yes Historical Provider, MD  rosuvastatin (CRESTOR) 10 MG tablet Take 10 mg by mouth every evening.    Yes Historical Provider, MD  sitaGLIPtin (JANUVIA) 100 MG tablet Take 100 mg by mouth daily.   Yes Historical Provider, MD  valsartan (DIOVAN) 320 MG tablet Take 320 mg by mouth daily.   Yes Historical Provider, MD  enoxaparin (LOVENOX) 40 MG/0.4ML injection Inject 0.4 mLs (40 mg total) into the skin daily. Patient not taking: Reported on 12/30/2014 11/25/14   Everitt Amber, MD  magnesium oxide (MAG-OX) 400 (241.3 MG) MG tablet Take 1 tablet (400 mg total) by mouth 2 (two) times daily. Patient not taking: Reported on 12/30/2014 12/12/14   Michael [name], MD  potassium chloride SA (K-DUR,KLOR-CON) 20 MEQ tablet Take 2 tablets (40 mEq total) by mouth daily. Patient not taking: Reported on 12/30/2014  12/12/14   Michael [name], MD  Tbo-Filgrastim [facility]) 480 MCG/0.8ML SOSY injection Inject 480 mcg into the skin once a week.    Historical Provider, MD    Physical Exam: Filed Vitals:   12/30/14 2200 12/30/14 2210 12/30/14 2300 12/30/14 2321  BP: 120/56 120/56 111/44   Pulse: 86 87 86   Temp:      TempSrc:      Resp:  16 16   Height:    5\' 2"  (1.575 m)  Weight:    106.4 kg (234 lb 9.1 oz)  SpO2: 96% 96% 100%      General:  Well-developed and nourished.  Eyes: Anicteric no pallor.  ENT: No discharge from the ears eyes nose and mouth.  Neck: No mass felt.  Cardiovascular: S1-S2 heard.  Respiratory: No rhonchi or crepitations.  Abdomen: Colostomy bag seen in the side with no clear skin changes. Soft nontender bowel sounds present.  Skin: Don't see any definite erythema around the colostomy site.  Musculoskeletal: No edema.  Psychiatric: Appears normal.  Neurologic: Alert awake oriented to time place and person. Moves all extremities.  Labs on Admission:  Basic Metabolic Panel:  Recent Labs Lab 12/30/14 1913  NA 137  K 2.8*  CL 98*  CO2 27  GLUCOSE 214*  BUN 21*  CREATININE 0.82  CALCIUM 8.9   Liver Function Tests:  Recent Labs Lab 12/30/14 1913  AST 15  ALT 16  ALKPHOS 69  BILITOT 0.4  PROT 7.5  ALBUMIN 3.2*   No results for input(s): LIPASE, AMYLASE in the last 168 hours. No results for input(s): AMMONIA in the last 168 hours. CBC:  Recent Labs Lab 12/27/14 1523 12/30/14 1913  WBC 3.7* 13.3*  NEUTROABS 1.4* 11.2*  HGB 10.4* 9.7*  HCT 33.7* 31.9*  MCV 86.6 84.8  PLT 358 347   Cardiac Enzymes: No results for input(s): CKTOTAL, CKMB, CKMBINDEX, TROPONINI in the last 168 hours.  BNP (last 3 results) No results for input(s): BNP in the last 8760 hours.  ProBNP (last 3 results) No results for input(s): PROBNP in the last 8760 hours.  CBG: No results for input(s): GLUCAP in the last 168 hours.  Radiological Exams on Admission: Ct  Abdomen Pelvis  W Contrast  12/30/2014   CLINICAL DATA:  Not feeling well for 2 days with fever and chills beginning today. Several procedures recently. Chronic intracranial with endometrial, ovarian and uterine carcinoma. 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  EXAM: CT ABDOMEN AND PELVIS WITH CONTRAST  TECHNIQUE: Multidetector CT imaging of the abdomen and pelvis was performed using the standard protocol following bolus administration of intravenous contrast.  CONTRAST:  161mL OMNIPAQUE IOHEXOL 300 MG/ML  SOLN  COMPARISON:  12/02/2014  FINDINGS: Lung bases are within normal.  Abdominal images demonstrate evidence of a previous cholecystectomy. The liver, spleen, pancreas and adrenal glands are normal.  Kidneys are normal size and demonstrate mild bilateral hydronephrosis with the presence of bilateral internal ureteral stents which are in adequate position and unchanged. Delayed images demonstrate contrast in the right intrarenal collecting system but only a scant amount of contrast in the left intrarenal collecting system. Further delayed images demonstrate no contrast in the bladder.  Evidence of patient's colostomy site over the left mid abdomen unchanged. Persistent minimal stranding of the fat adjacent to the ostomy as cannot exclude mild soft tissue infection. No evidence of abscess. Interval resolution of the previous noted subcutaneous air over the abdominal wall. New area of stranding of the subcutaneous fat over the left anterior abdominal wall below the ostomy site which may be due to edema versus soft tissue infection.  Pelvic images demonstrate surgical suture line over the anorectal junction with stable postsurgical changes over the posterior pelvis. A small bowel loop courses into the region of the surgical bed. Bladder is unremarkable. Remainder the exam is unchanged.  IMPRESSION: Stable postsurgical changes over the posterior pelvis likely related to recent excision  of recurrent endometrial carcinoma.  Colostomy site over the left mid abdomen with persistent minimal stranding of the fat adjacent to the ostomy as cannot exclude soft tissue infection. No abscess. Second site of stranding of the subcutaneous fat over the left anterior abdominal wall below the ostomy site which again may represent mild edema versus soft tissue infection.  Bilateral double-J internal ureteral stents in adequate position with persistent mild bilateral hydronephrosis.   Electronically Signed   By: Marin Olp M.D.   On: 12/30/2014 21:48   Dg Abd Acute W/chest  12/30/2014   CLINICAL DATA:  Fever/ chills x3 days, nausea  EXAM: DG ABDOMEN ACUTE W/ 1V CHEST  COMPARISON:  CT abdomen pelvis dated 12/02/2014. Chest radiographs dated 11/20/2014.  FINDINGS: Lungs are clear.  No pleural effusion or pneumothorax.  The heart is normal in size.  Nonobstructive bowel gas pattern. Moderate colonic stool burden. Left mid abdominal colostomy.  No evidence of free air under the diaphragm on the upright view.  Bilateral ureteral stents.  Cholecystectomy clips. Multiple surgical clips overlying the lower abdomen/pelvis.  IMPRESSION: No evidence of acute cardiopulmonary disease.  No evidence of small bowel obstruction or free air.  Moderate colonic stool burden.  Surgical changes, as above.   Electronically Signed   By: Julian Hy M.D.   On: 12/30/2014 19:48     Assessment/Plan Principal Problem:   Sepsis Active Problems:   Hypothyroidism   Hypokalemia   UTI (lower urinary tract infection)   Diabetes mellitus type 2, controlled   Chronic anemia   Urinary tract infectious disease   1. Sepsis - most likely from UTI but CT scan also shows stranding around the colostomy site. I have discussed with the on-call urologist Dr. Jeffie Pollock. Dr. Jeffie Pollock felt that there is no  immediate reasons to remove patient's ureteral stents as patient has no obstruction and may discuss with patient urologist in a.m. At this  time I have placed patient on vancomycin and Azactam and Flagyl. Follow blood cultures and urine cultures. May discuss with patient's oncology surgeon and neurosurgeon in a.m. Continue with hydration. Hold antihypertensives. 2. Hypokalemia - could be from poor oral intake and diuretics. Replace and recheck. Hold HCTZ primarily because patient is also mildly on the low normal blood pressure. Check magnesium levels. 3. Diabetes mellitus type 2 mildly uncontrolled - patient states her blood sugar usually runs in the 120s but today it has been running high. Patient has been placed on sliding-scale coverage and while inpatient and we will hold off metformin for now. Continue patient's other anti-diabetic medications. 4. Hypertension presently blood pressure is in the low-normal for which we're holding off antihypertensives. 5. Chronic anemia - follow CBC. 6. Hypothyroidism on Synthroid. 7. History of metastatic endometrial/ovarian CA status post recent surgery - per oncology.   DVT Prophylaxis Lovenox.  Code Status: Full code.  Family Communication: Discussed with patient's husband.  Disposition Plan: Admit to inpatient. Likely stay 2-3 days.    KAKRAKANDY,ARSHAD N. [facility] Pager [phone].  If 7PM-7AM, please contact night-coverage [email or web address] Password [email or web address] 12/30/2014, 11:32 PM

## 2014-12-30 NOTE — ED Provider Notes (Signed)
CSN: [id number]     Arrival date & time 12/30/14  1735 History   First MD Initiated Contact with Patient 12/30/14 1821     Chief Complaint  Patient presents with  . Fever  . Chills     (Consider location/radiation/quality/duration/timing/severity/associated sxs/prior Treatment) Patient is a 59 y.o. female presenting with fever. The history is provided by the patient.  Fever Associated symptoms: nausea    patient has had a fever up to 101 at home. Has chronic neutropenia and had been feeling bad over the last 3 days. No cough. She's had complicated abdominal surgeries after a tumor removal complicated by ostomy infection. States she is worried she could've thrush infection in her mouth 2. She's also had some dark small following urine. She had some urinary surgery with large surgery. She currently has a colostomy in the left upper quadrant. Patient has had more pain in her right abdomen recently. No cough. She's had nausea without clear diarrhea she does have somewhat soft colostomy output baseline. She states the wounds on her abdomen and the ostomy are looking better though they have recently.  Past Medical History  Diagnosis Date  . Hypothyroidism 12/14/2011  . Benign essential HTN 12/14/2011  . Neutropenia   . Postmenopausal bleeding 09/05/2014  . Anemia     Planned Neupogen injections for chronic neutropenia  . Transfusion history     '06-s/p hysterectomy and staging  . Multiple thyroid nodules y-8    Managed by Dr. Harlow Asa  . GERD (gastroesophageal reflux disease)   . Polyp of duodenum y-2  . DM type 2 (diabetes mellitus, type 2) 12/14/2011  . Dysrhythmia     skipped beat with occasional coffee   . Hx of bronchitis   . Depression   . Hiatal hernia y-12    hx of small hiatal hernia   . Neutropenia     chronic   . Cancer     '06-Ovarian/ Uterine Cancer-chemotherapy  . 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 Appendectomy     . Tonsillectomy    . Cholecystectomy      laparoscopic  . Eye surgery      pytosis of eyelids-child  . Colonoscopy with propofol N/A 08/21/2013    Procedure: COLONOSCOPY WITH PROPOFOL;  Surgeon: Cleotis Nipper, MD;  Location: [facility];  Service: Endoscopy;  Laterality: N/A;  . Abdominal hysterectomy      staging for Ovarian cancer  . Eus N/A 10/02/2014    Procedure: LOWER ENDOSCOPIC ULTRASOUND (EUS);  Surgeon: Arta Silence, MD;  Location: [facility];  Service: Endoscopy;  Laterality: N/A;  . Robotic assisted lap vaginal hysterectomy N/A 11/19/2014    Procedure: ROBOTIC LYSIS OF ADHESIONS, CONVERTED TO LAPAROTOMY RADICAL UPPER VAGINECTOMY,LOW ANTERIOR BOWEL RESECTION, COLOSTOMY, BILATERAL URETERAL STENT PLACEMENT AND CYSTONOMY CLOSURE;  Surgeon: Everitt Amber, MD;  Location: [facility];  Service: Gynecology;  Laterality: N/A;  . Ostomy N/A 11/19/2014    Procedure: OSTOMY;  Surgeon: Michael [name], MD;  Location: [facility];  Service: General;  Laterality: N/A;  . Colostomy takedown N/A 12/04/2014    Procedure: LAPROSCOPIC LYSIS OF ADHESIONS, SPLENIC MOBILIZATION, RELOCATION OF COLOSTOMY, DEBRIDEMENT INITIAL COLOSTOMY SITE;  Surgeon: Michael [name], MD;  Location: [facility];  Service: General;  Laterality: N/A;   Family History  Problem Relation Age of Onset  . Cancer Mother     stomach ca  . Hypertension Mother   . Cancer Father     prostate ca  .  Diabetes Father   . Diabetes Sister   . Hypertension Brother y-10   History  Substance Use Topics  . Smoking status: Never Smoker   . Smokeless tobacco: Never Used  . Alcohol Use: Yes     Comment: rare social   OB History    No data available     Review of Systems  Constitutional: Positive for fever and appetite change.  HENT: Negative for dental problem.   Gastrointestinal: Positive for nausea and abdominal pain.  Genitourinary: Negative for flank pain.  Musculoskeletal: Negative for back pain.  Skin: Negative for wound.  Neurological:  Negative for light-headedness.      Allergies  Penicillins; Ultram; Adhesive; Cefaclor; Erythromycin; Trimethoprim; and Sulfa antibiotics  Home Medications   Prior to Admission medications   Medication Sig Start Date End Date Taking? Authorizing Provider  acetaminophen (TYLENOL) 500 MG tablet Take 2 tablets (1,000 mg total) by mouth 3 (three) times daily. Patient taking differently: Take 1,000 mg by mouth every 8 (eight) hours as needed for moderate pain or fever (pain).  11/25/14  Yes Everitt Amber, MD  Alum & Mag Hydroxide-Simeth (MAGIC MOUTHWASH) SOLN Take 5 mLs by mouth daily as needed for mouth pain (mouth pain).   Yes Historical Provider, MD  Calcium Carbonate-Vitamin D (CALCIUM + D PO) Take 2 tablets by mouth daily.   Yes Historical Provider, MD  Canagliflozin (INVOKANA) 300 MG TABS Take 1 tablet by mouth every morning.    Yes Historical Provider, MD  Cholecalciferol (VITAMIN D3) 10000 UNITS capsule Take 10,000 Units by mouth once a week. Sundays   Yes Historical Provider, MD  ferrous sulfate 325 (65 FE) MG tablet Take 1 tablet (325 mg total) by mouth 2 (two) times daily with a meal. 11/25/14  Yes Everitt Amber, MD  hydrochlorothiazide (HYDRODIURIL) 12.5 MG tablet Take 12.5 mg by mouth every morning.    Yes Historical Provider, MD  ibuprofen (ADVIL,MOTRIN) 800 MG tablet Take 800 mg by mouth every 8 (eight) hours as needed for moderate pain (pain).   Yes Historical Provider, MD  levothyroxine (SYNTHROID) 175 MCG tablet Take 175 mcg by mouth daily before breakfast.   Yes Historical Provider, MD  metFORMIN (GLUCOPHAGE) 1000 MG tablet Take 1,000 mg by mouth 2 (two) times daily with a meal.    Yes Historical Provider, MD  Multiple Vitamin (MULTIVITAMIN WITH MINERALS) TABS tablet Take 1 tablet by mouth daily.   Yes Historical Provider, MD  omega-3 acid ethyl esters (LOVAZA) 1 G capsule Take 1 g by mouth 2 (two) times daily.   Yes Historical Provider, MD  omeprazole (PRILOSEC) 20 MG capsule Take 20  mg by mouth at bedtime.    Yes Historical Provider, MD  ondansetron (ZOFRAN) 4 MG tablet Take 1 tablet (4 mg total) by mouth every 6 (six) hours as needed for nausea. 11/25/14  Yes Everitt Amber, MD  ONETOUCH VERIO test strip  11/10/14  Yes Historical Provider, MD  oxyCODONE (OXY IR/ROXICODONE) 5 MG immediate release tablet Take 1-2 tablets (5-10 mg total) by mouth every 4 (four) hours as needed for moderate pain, severe pain or breakthrough pain. 12/12/14  Yes Michael [name], MD  Polyethyl Glycol-Propyl Glycol (SYSTANE OP) Apply 1 drop to eye daily as needed (Dry eyes).    Yes Historical Provider, MD  Probiotic Product (PROBIOTIC DAILY PO) Take 1 capsule by mouth daily.   Yes Historical Provider, MD  rosuvastatin (CRESTOR) 10 MG tablet Take 10 mg by mouth every evening.    Yes Historical  Provider, MD  sitaGLIPtin (JANUVIA) 100 MG tablet Take 100 mg by mouth daily.   Yes Historical Provider, MD  valsartan (DIOVAN) 320 MG tablet Take 320 mg by mouth daily.   Yes Historical Provider, MD  enoxaparin (LOVENOX) 40 MG/0.4ML injection Inject 0.4 mLs (40 mg total) into the skin daily. Patient not taking: Reported on 12/30/2014 11/25/14   Everitt Amber, MD  magnesium oxide (MAG-OX) 400 (241.3 MG) MG tablet Take 1 tablet (400 mg total) by mouth 2 (two) times daily. Patient not taking: Reported on 12/30/2014 12/12/14   Michael [name], MD  potassium chloride SA (K-DUR,KLOR-CON) 20 MEQ tablet Take 2 tablets (40 mEq total) by mouth daily. Patient not taking: Reported on 12/30/2014 12/12/14   Michael [name], MD  Tbo-Filgrastim [facility]) 480 MCG/0.8ML SOSY injection Inject 480 mcg into the skin once a week.    Historical Provider, MD   BP 111/44 mmHg  Pulse 86  Temp(Src) 100.5 F (38.1 C) (Oral)  Resp 16  Ht 5\' 2"  (1.575 m)  Wt 234 lb 9.1 oz (106.4 kg)  BMI 42.89 kg/m2  SpO2 100% Physical Exam  Constitutional: She appears well-developed.  Cardiovascular: Normal rate and regular rhythm.   Pulmonary/Chest: Effort normal.   Abdominal: Soft.  Colostomy left upper quadrant. Healing site of previous ostomy left lower quadrant. Well-appearing wound. Some tenderness on right side greater than left.    ED Course  Procedures (including critical care time) Labs Review Labs Reviewed  COMPREHENSIVE METABOLIC PANEL - Abnormal; Notable for the following:    Potassium 2.8 (*)    Chloride 98 (*)    Glucose, Bld 214 (*)    BUN 21 (*)    Albumin 3.2 (*)    All other components within normal limits  CBC WITH DIFFERENTIAL/PLATELET - Abnormal; Notable for the following:    WBC 13.3 (*)    RBC 3.76 (*)    Hemoglobin 9.7 (*)    HCT 31.9 (*)    MCH 25.8 (*)    RDW 16.2 (*)    Neutrophils Relative % 85 (*)    Neutro Abs 11.2 (*)    Lymphocytes Relative 6 (*)    Monocytes Absolute 1.2 (*)    All other components within normal limits  URINALYSIS, ROUTINE W REFLEX MICROSCOPIC (NOT AT [facility]) - Abnormal; Notable for the following:    APPearance CLOUDY (*)    Glucose, UA >1000 (*)    Hgb urine dipstick LARGE (*)    Protein, ur 30 (*)    Nitrite POSITIVE (*)    Leukocytes, UA MODERATE (*)    All other components within normal limits  URINE MICROSCOPIC-ADD ON - Abnormal; Notable for the following:    Bacteria, UA MANY (*)    All other components within normal limits  CBC - Abnormal; Notable for the following:    RBC 3.47 (*)    Hemoglobin 8.9 (*)    HCT 29.5 (*)    MCH 25.6 (*)    RDW 16.1 (*)    All other components within normal limits  CULTURE, BLOOD (ROUTINE X 2)  CULTURE, BLOOD (ROUTINE X 2)  URINE CULTURE  MAGNESIUM  LACTIC ACID, PLASMA  PROCALCITONIN  PROTIME-INR  APTT  CREATININE, SERUM  COMPREHENSIVE METABOLIC PANEL  CBC WITH DIFFERENTIAL/PLATELET  I-STAT CG4 LACTIC ACID, ED    Imaging Review Ct Abdomen Pelvis W Contrast  12/30/2014   CLINICAL DATA:  Not feeling well for 2 days with fever and chills beginning today. Several procedures recently. Chronic intracranial  with endometrial, ovarian and  uterine carcinoma. 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  EXAM: CT ABDOMEN AND PELVIS WITH CONTRAST  TECHNIQUE: Multidetector CT imaging of the abdomen and pelvis was performed using the standard protocol following bolus administration of intravenous contrast.  CONTRAST:  140mL OMNIPAQUE IOHEXOL 300 MG/ML  SOLN  COMPARISON:  12/02/2014  FINDINGS: Lung bases are within normal.  Abdominal images demonstrate evidence of a previous cholecystectomy. The liver, spleen, pancreas and adrenal glands are normal.  Kidneys are normal size and demonstrate mild bilateral hydronephrosis with the presence of bilateral internal ureteral stents which are in adequate position and unchanged. Delayed images demonstrate contrast in the right intrarenal collecting system but only a scant amount of contrast in the left intrarenal collecting system. Further delayed images demonstrate no contrast in the bladder.  Evidence of patient's colostomy site over the left mid abdomen unchanged. Persistent minimal stranding of the fat adjacent to the ostomy as cannot exclude mild soft tissue infection. No evidence of abscess. Interval resolution of the previous noted subcutaneous air over the abdominal wall. New area of stranding of the subcutaneous fat over the left anterior abdominal wall below the ostomy site which may be due to edema versus soft tissue infection.  Pelvic images demonstrate surgical suture line over the anorectal junction with stable postsurgical changes over the posterior pelvis. A small bowel loop courses into the region of the surgical bed. Bladder is unremarkable. Remainder the exam is unchanged.  IMPRESSION: Stable postsurgical changes over the posterior pelvis likely related to recent excision of recurrent endometrial carcinoma.  Colostomy site over the left mid abdomen with persistent minimal stranding of the fat adjacent to the ostomy as cannot exclude soft tissue infection. No abscess. Second  site of stranding of the subcutaneous fat over the left anterior abdominal wall below the ostomy site which again may represent mild edema versus soft tissue infection.  Bilateral double-J internal ureteral stents in adequate position with persistent mild bilateral hydronephrosis.   Electronically Signed   By: Marin Olp M.D.   On: 12/30/2014 21:48   Dg Abd Acute W/chest  12/30/2014   CLINICAL DATA:  Fever/ chills x3 days, nausea  EXAM: DG ABDOMEN ACUTE W/ 1V CHEST  COMPARISON:  CT abdomen pelvis dated 12/02/2014. Chest radiographs dated 11/20/2014.  FINDINGS: Lungs are clear.  No pleural effusion or pneumothorax.  The heart is normal in size.  Nonobstructive bowel gas pattern. Moderate colonic stool burden. Left mid abdominal colostomy.  No evidence of free air under the diaphragm on the upright view.  Bilateral ureteral stents.  Cholecystectomy clips. Multiple surgical clips overlying the lower abdomen/pelvis.  IMPRESSION: No evidence of acute cardiopulmonary disease.  No evidence of small bowel obstruction or free air.  Moderate colonic stool burden.  Surgical changes, as above.   Electronically Signed   By: Julian Hy M.D.   On: 12/30/2014 19:48     EKG Interpretation None      MDM   Final diagnoses:  Urinary tract infection without hematuria, site unspecified    Patient with fever. Has history of neutropenia and complicated abdominal surgery. Has possible urinary tract infection. White count is elevated but has had Neupogen. Normal lactic acid. CT scan overall reassuring. Site of abdominal wall considered less likely cause of the infection. Will admit to internal medicine. Urine and blood culture sent.    Davonna Belling, MD 12/31/14 [id number]

## 2014-12-30 NOTE — ED Notes (Signed)
MD at bedside. 

## 2014-12-30 NOTE — ED Notes (Signed)
Bed: WA06 Expected date:  Expected time:  Means of arrival:  Comments: tr1

## 2014-12-31 DIAGNOSIS — E038 Other specified hypothyroidism: Secondary | ICD-10-CM

## 2014-12-31 LAB — COMPREHENSIVE METABOLIC PANEL
ALBUMIN: 2.8 g/dL — AB (ref 3.5–5.0)
ALK PHOS: 57 U/L (ref 38–126)
ALT: 12 U/L — ABNORMAL LOW (ref 14–54)
AST: 11 U/L — AB (ref 15–41)
Anion gap: 11 (ref 5–15)
BILIRUBIN TOTAL: 0.6 mg/dL (ref 0.3–1.2)
BUN: 15 mg/dL (ref 6–20)
CHLORIDE: 99 mmol/L — AB (ref 101–111)
CO2: 25 mmol/L (ref 22–32)
Calcium: 7.9 mg/dL — ABNORMAL LOW (ref 8.9–10.3)
Creatinine, Ser: 0.76 mg/dL (ref 0.44–1.00)
GFR calc Af Amer: >60 mL/min (ref >60)
GFR calc non Af Amer: >60 mL/min (ref >60)
Glucose, Bld: 156 mg/dL — ABNORMAL HIGH (ref 65–99)
POTASSIUM: 2.8 mmol/L — AB (ref 3.5–5.1)
Sodium: 135 mmol/L (ref 135–145)
TOTAL PROTEIN: 6.7 g/dL (ref 6.5–8.1)

## 2014-12-31 LAB — PROTIME-INR
INR: 1.18 (ref 0.00–1.49)
Prothrombin Time: 15.2 seconds (ref 11.6–15.2)

## 2014-12-31 LAB — CREATININE, SERUM
CREATININE: 0.86 mg/dL (ref 0.44–1.00)
GFR calc Af Amer: >60 mL/min (ref >60)
GFR calc non Af Amer: >60 mL/min (ref >60)

## 2014-12-31 LAB — CBC WITH DIFFERENTIAL/PLATELET
Basophils Absolute: 0 10*3/uL (ref 0.0–0.1)
Basophils Relative: 0 % (ref 0–1)
EOS ABS: 0 10*3/uL (ref 0.0–0.7)
Eosinophils Relative: 1 % (ref 0–5)
HEMATOCRIT: 28 % — AB (ref 36.0–46.0)
HEMOGLOBIN: 8.7 g/dL — AB (ref 12.0–15.0)
LYMPHS ABS: 1.1 10*3/uL (ref 0.7–4.0)
LYMPHS PCT: 12 % (ref 12–46)
MCH: 26.7 pg (ref 26.0–34.0)
MCHC: 31.1 g/dL (ref 30.0–36.0)
MCV: 85.9 fL (ref 78.0–100.0)
MONO ABS: 1.4 10*3/uL — AB (ref 0.1–1.0)
MONOS PCT: 16 % — AB (ref 3–12)
Neutro Abs: 6.1 10*3/uL (ref 1.7–7.7)
Neutrophils Relative %: 71 % (ref 43–77)
Platelets: 283 10*3/uL (ref 150–400)
RBC: 3.26 MIL/uL — AB (ref 3.87–5.11)
RDW: 16.1 % — ABNORMAL HIGH (ref 11.5–15.5)
WBC: 8.6 10*3/uL (ref 4.0–10.5)

## 2014-12-31 LAB — CBC
HEMATOCRIT: 29.5 % — AB (ref 36.0–46.0)
Hemoglobin: 8.9 g/dL — ABNORMAL LOW (ref 12.0–15.0)
MCH: 25.6 pg — ABNORMAL LOW (ref 26.0–34.0)
MCHC: 30.2 g/dL (ref 30.0–36.0)
MCV: 85 fL (ref 78.0–100.0)
PLATELETS: 294 10*3/uL (ref 150–400)
RBC: 3.47 MIL/uL — ABNORMAL LOW (ref 3.87–5.11)
RDW: 16.1 % — AB (ref 11.5–15.5)
WBC: 9.4 10*3/uL (ref 4.0–10.5)

## 2014-12-31 LAB — GLUCOSE, CAPILLARY
GLUCOSE-CAPILLARY: 152 mg/dL — AB (ref 65–99)
GLUCOSE-CAPILLARY: 102 mg/dL — AB (ref 65–99)
GLUCOSE-CAPILLARY: 148 mg/dL — AB (ref 65–99)
Glucose-Capillary: 151 mg/dL — ABNORMAL HIGH (ref 65–99)

## 2014-12-31 LAB — LACTIC ACID, PLASMA: Lactic Acid, Venous: 1.2 mmol/L (ref 0.5–2.0)

## 2014-12-31 LAB — PROCALCITONIN: PROCALCITONIN: 0.24 ng/mL

## 2014-12-31 LAB — APTT: APTT: 31 s (ref 24–37)

## 2014-12-31 LAB — MAGNESIUM: MAGNESIUM: 1.2 mg/dL — AB (ref 1.7–2.4)

## 2014-12-31 MED ORDER — MAGNESIUM SULFATE 2 GM/50ML IV SOLN
2.0000 g | Freq: Once | INTRAVENOUS | Status: AC
  Administered 2014-12-31: 2 g via INTRAVENOUS
  Filled 2014-12-31: qty 50

## 2014-12-31 MED ORDER — DEXTROSE 5 % IV SOLN
1.0000 g | Freq: Three times a day (TID) | INTRAVENOUS | Status: DC
  Administered 2014-12-31 – 2015-01-02 (×8): 1 g via INTRAVENOUS
  Filled 2014-12-31 (×9): qty 1

## 2014-12-31 MED ORDER — VANCOMYCIN HCL IN DEXTROSE 1-5 GM/200ML-% IV SOLN
1000.0000 mg | Freq: Two times a day (BID) | INTRAVENOUS | Status: DC
  Administered 2014-12-31 – 2015-01-01 (×4): 1000 mg via INTRAVENOUS
  Filled 2014-12-31 (×4): qty 200

## 2014-12-31 NOTE — Progress Notes (Signed)
[facility] PROGRESS NOTE  [name] [id number] DOB: [birth year] DOA: 12/30/2014 PCP: MATTHEWS,MICHELLE A., MD  Assessment/Plan: 1. Sepsis with UTI 1. Improved with aztreonam 2. Pan-cultures obtained, pending results 3. Given acuity of presenting sepsis and as pt is s/p bladder surgery and ureteral stenting, would await culture results and complete at least 24hrs of IV abx before narrowing regimen. 2. Hypokalemia 1. Replaced 2. Replace magnesium per below 3. Hypomagnesemia  1. Mg of 1.2 2. Will replace 4. DM2 1. Glucose overall stable 2. Cont SSI coverage 5. HTN 1. BP stable 6. Chronic anemia 1. hgb stable 2. Cont to monitor 7. Hypothyroid 1. Pt is continued on thyroid replacement 8. Metastatic endometrial/ovarian cancer 1. Pt is followed by Oncology 2. Pt reports having multiple follow up visits this week 9. DVT prophylaxis 1. Lovenox subQ  Code Status: Full Family Communication: Pt in room (indicate person spoken with, relationship, and if by phone, the number) Disposition Plan: Pending   Consultants:    Procedures:    Antibiotics:  Aztreonam 5/31>>>   HPI/Subjective: Feels better today. Eager to go home  Objective: Filed Vitals:   12/30/14 2321 12/31/14 0503 12/31/14 0940 12/31/14 1405  BP:  122/59 113/59 126/59  Pulse:  90 87 85  Temp:  99.3 F (37.4 C) 98.7 F (37.1 C) 99.2 F (37.3 C)  TempSrc:  Oral Oral Oral  Resp:  18 18 16   Height: 5\' 2"  (1.575 m)     Weight: 106.4 kg (234 lb 9.1 oz) 106.142 kg (234 lb)    SpO2:  97% 100% 100%    Intake/Output Summary (Last 24 hours) at 12/31/14 1550 Last data filed at 12/31/14 1440  Gross per 24 hour  Intake 2907.08 ml  Output   3501 ml  Net -593.92 ml   Filed Weights   12/30/14 2321 12/31/14 0503  Weight: 106.4 kg (234 lb 9.1 oz) 106.142 kg (234 lb)    Exam:   General:  Awake, in nad  Cardiovascular: regular, s1, s2  Respiratory: normal resp effort, no wheezing  Abdomen:  soft, obese, nondistended  Musculoskeletal: perfused, no clubbing   Data Reviewed: Basic Metabolic Panel:  Recent Labs Lab 12/30/14 1913 12/31/14 0004 12/31/14 0638  NA 137  --  135  K 2.8*  --  2.8*  CL 98*  --  99*  CO2 27  --  25  GLUCOSE 214*  --  156*  BUN 21*  --  15  CREATININE 0.82 0.86 0.76  CALCIUM 8.9  --  7.9*  MG  --  1.2*  --    Liver Function Tests:  Recent Labs Lab 12/30/14 1913 12/31/14 0638  AST 15 11*  ALT 16 12*  ALKPHOS 69 57  BILITOT 0.4 0.6  PROT 7.5 6.7  ALBUMIN 3.2* 2.8*   No results for input(s): LIPASE, AMYLASE in the last 168 hours. No results for input(s): AMMONIA in the last 168 hours. CBC:  Recent Labs Lab 12/27/14 1523 12/30/14 1913 12/31/14 0004 12/31/14 0638  WBC 3.7* 13.3* 9.4 8.6  NEUTROABS 1.4* 11.2*  --  6.1  HGB 10.4* 9.7* 8.9* 8.7*  HCT 33.7* 31.9* 29.5* 28.0*  MCV 86.6 84.8 85.0 85.9  PLT 358 347 294 283   Cardiac Enzymes: No results for input(s): CKTOTAL, CKMB, CKMBINDEX, TROPONINI in the last 168 hours. BNP (last 3 results) No results for input(s): BNP in the last 8760 hours.  ProBNP (last 3 results) No results for input(s): PROBNP in the last 8760 hours.  CBG:  Recent Labs Lab 12/31/14 0722 12/31/14 1139  GLUCAP 152* 151*    No results found for this or any previous visit (from the past 240 hour(s)).   Studies: Ct Abdomen Pelvis W Contrast  12/30/2014   CLINICAL DATA:  Not feeling well for 2 days with fever and chills beginning today. Several procedures recently. Chronic intracranial with endometrial, ovarian and uterine carcinoma. 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  EXAM: CT ABDOMEN AND PELVIS WITH CONTRAST  TECHNIQUE: Multidetector CT imaging of the abdomen and pelvis was performed using the standard protocol following bolus administration of intravenous contrast.  CONTRAST:  128mL OMNIPAQUE IOHEXOL 300 MG/ML  SOLN  COMPARISON:  12/02/2014  FINDINGS: Lung bases are  within normal.  Abdominal images demonstrate evidence of a previous cholecystectomy. The liver, spleen, pancreas and adrenal glands are normal.  Kidneys are normal size and demonstrate mild bilateral hydronephrosis with the presence of bilateral internal ureteral stents which are in adequate position and unchanged. Delayed images demonstrate contrast in the right intrarenal collecting system but only a scant amount of contrast in the left intrarenal collecting system. Further delayed images demonstrate no contrast in the bladder.  Evidence of patient's colostomy site over the left mid abdomen unchanged. Persistent minimal stranding of the fat adjacent to the ostomy as cannot exclude mild soft tissue infection. No evidence of abscess. Interval resolution of the previous noted subcutaneous air over the abdominal wall. New area of stranding of the subcutaneous fat over the left anterior abdominal wall below the ostomy site which may be due to edema versus soft tissue infection.  Pelvic images demonstrate surgical suture line over the anorectal junction with stable postsurgical changes over the posterior pelvis. A small bowel loop courses into the region of the surgical bed. Bladder is unremarkable. Remainder the exam is unchanged.  IMPRESSION: Stable postsurgical changes over the posterior pelvis likely related to recent excision of recurrent endometrial carcinoma.  Colostomy site over the left mid abdomen with persistent minimal stranding of the fat adjacent to the ostomy as cannot exclude soft tissue infection. No abscess. Second site of stranding of the subcutaneous fat over the left anterior abdominal wall below the ostomy site which again may represent mild edema versus soft tissue infection.  Bilateral double-J internal ureteral stents in adequate position with persistent mild bilateral hydronephrosis.   Electronically Signed   By: Marin Olp M.D.   On: 12/30/2014 21:48   Dg Abd Acute W/chest  12/30/2014    CLINICAL DATA:  Fever/ chills x3 days, nausea  EXAM: DG ABDOMEN ACUTE W/ 1V CHEST  COMPARISON:  CT abdomen pelvis dated 12/02/2014. Chest radiographs dated 11/20/2014.  FINDINGS: Lungs are clear.  No pleural effusion or pneumothorax.  The heart is normal in size.  Nonobstructive bowel gas pattern. Moderate colonic stool burden. Left mid abdominal colostomy.  No evidence of free air under the diaphragm on the upright view.  Bilateral ureteral stents.  Cholecystectomy clips. Multiple surgical clips overlying the lower abdomen/pelvis.  IMPRESSION: No evidence of acute cardiopulmonary disease.  No evidence of small bowel obstruction or free air.  Moderate colonic stool burden.  Surgical changes, as above.   Electronically Signed   By: Julian Hy M.D.   On: 12/30/2014 19:48    Scheduled Meds: . aztreonam  1 g Intravenous 3 times per day  . canagliflozin  300 mg Oral q morning - 10a  . enoxaparin (LOVENOX) injection  40 mg Subcutaneous QHS  . ferrous sulfate  325 mg Oral BID WC  . insulin aspart  0-9 Units Subcutaneous TID WC  . levothyroxine  175 mcg Oral QAC breakfast  . linagliptin  5 mg Oral Daily  . multivitamin with minerals  1 tablet Oral Daily  . omega-3 acid ethyl esters  1 g Oral BID  . pantoprazole  40 mg Oral Daily  . rosuvastatin  10 mg Oral QPM  . vancomycin  1,000 mg Intravenous BID   Continuous Infusions: . 0.9 % NaCl with KCl 20 mEq / L 1,000 mL (12/31/14 1311)    Principal Problem:   Sepsis Active Problems:   Hypothyroidism   Hypokalemia   UTI (lower urinary tract infection)   Diabetes mellitus type 2, controlled   Chronic anemia   Urinary tract infectious disease   [name], [facility] Pager [phone]. If 7PM-7AM, please contact night-coverage at [email or web address], password [facility] 12/31/2014, 3:50 PM  LOS: 1 day

## 2014-12-31 NOTE — Consult Note (Signed)
Consult Note: Gyn-Onc  Consult was requested by Dr. Elmer Picker for the evaluation of [name] 59 y.o. female admitted with urosepsis  CC:  Chief Complaint  Patient presents with  . Fever  . Chills    Assessment/Plan:  [name]  is a 59 y.o.  year old who is approximately 6 weeks s/p posterior supralevator exenteration and bladder repair and ureteral stenting for recurrent endometrial cancer in the setting of chronic idiopathic neutropenia. She is admitted with urosepsis from complicated UTI. She is much improved clinically after receiving IV antibiotics, fluid support.  I reviewed her CT images from her imaging on admission (12/30/14). She has mildly worse hydro, though this may be a function of her having stents in and no foley. Given the very tenuous repair close the UO's bilaterally, and the mild hydro, we will plan to continue ureteral stenting for an additional 4 weeks and reconsider removing stents after a CT urogram at that time.   With respect to her recurrent uterine cancer, I am recommending radiation (external beam). She will see Dr Sondra Come this week to establish this. I believe that once she has resolved her UTI it is reasonable to start this. I do not recommend waiting until stents are removed.   HPI: [name] is a 59 year old woman with recurrent endometrial cancer who is 6 week s/p supralevator posterior extenteration with bladder repair and placement of ureteral stents. Her postop course was complicated by a perforation from her colostomy wall which needed repositioning of the colostomy in the upper abdomen. She was admitted to [facility] yesterday with fevers, chills and elevated WBC. UA demonstrated sigs of infection. She was mildly hypotensive.   She was admitted and started on Aztreonam.  CT of the abdo/pelvis with contrast revealed no abscess. She has had some interval very mild progression of her bilateral hydro (still mild).    Today she is feeling  much better.  Current Meds:  Current Facility-Administered Medications on File Prior to Encounter  Medication Dose Route Frequency Provider Last Rate Last Dose  . Tbo-Filgrastim (GRANIX) injection 480 mcg  480 mcg Subcutaneous Weekly Heath Lark, MD   480 mcg at 11/29/14 1338   Current Outpatient Prescriptions on File Prior to Encounter  Medication Sig Dispense Refill  . acetaminophen (TYLENOL) 500 MG tablet Take 2 tablets (1,000 mg total) by mouth 3 (three) times daily. (Patient taking differently: Take 1,000 mg by mouth every 8 (eight) hours as needed for moderate pain or fever (pain). ) 30 tablet 0  . Calcium Carbonate-Vitamin D (CALCIUM + D PO) Take 2 tablets by mouth daily.    . Canagliflozin (INVOKANA) 300 MG TABS Take 1 tablet by mouth every morning.     . Cholecalciferol (VITAMIN D3) 10000 UNITS capsule Take 10,000 Units by mouth once a week. Sundays    . ferrous sulfate 325 (65 FE) MG tablet Take 1 tablet (325 mg total) by mouth 2 (two) times daily with a meal. 60 tablet 3  . hydrochlorothiazide (HYDRODIURIL) 12.5 MG tablet Take 12.5 mg by mouth every morning.     [name] Kitchen levothyroxine (SYNTHROID) 175 MCG tablet Take 175 mcg by mouth daily before breakfast.    . metFORMIN (GLUCOPHAGE) 1000 MG tablet Take 1,000 mg by mouth 2 (two) times daily with a meal.     . Multiple Vitamin (MULTIVITAMIN WITH MINERALS) TABS tablet Take 1 tablet by mouth daily.    [name] Kitchen omega-3 acid ethyl esters (LOVAZA) 1 G capsule Take 1 g  by mouth 2 (two) times daily.    [name] Kitchen omeprazole (PRILOSEC) 20 MG capsule Take 20 mg by mouth at bedtime.     . ondansetron (ZOFRAN) 4 MG tablet Take 1 tablet (4 mg total) by mouth every 6 (six) hours as needed for nausea. 20 tablet 0  . ONETOUCH VERIO test strip     . oxyCODONE (OXY IR/ROXICODONE) 5 MG immediate release tablet Take 1-2 tablets (5-10 mg total) by mouth every 4 (four) hours as needed for moderate pain, severe pain or breakthrough pain. 40 tablet 0  . Polyethyl Glycol-Propyl  Glycol (SYSTANE OP) Apply 1 drop to eye daily as needed (Dry eyes).     . rosuvastatin (CRESTOR) 10 MG tablet Take 10 mg by mouth every evening.     . sitaGLIPtin (JANUVIA) 100 MG tablet Take 100 mg by mouth daily.    . valsartan (DIOVAN) 320 MG tablet Take 320 mg by mouth daily.    [name] Kitchen enoxaparin (LOVENOX) 40 MG/0.4ML injection Inject 0.4 mLs (40 mg total) into the skin daily. (Patient not taking: Reported on 12/30/2014) 21 Syringe 1  . magnesium oxide (MAG-OX) 400 (241.3 MG) MG tablet Take 1 tablet (400 mg total) by mouth 2 (two) times daily. (Patient not taking: Reported on 12/30/2014) 30 tablet 1  . potassium chloride SA (K-DUR,KLOR-CON) 20 MEQ tablet Take 2 tablets (40 mEq total) by mouth daily. (Patient not taking: Reported on 12/30/2014) 20 tablet 1  . Tbo-Filgrastim (GRANIX) 480 MCG/0.8ML SOSY injection Inject 480 mcg into the skin once a week.      Allergy:  Allergies  Allergen Reactions  . Penicillins Swelling    Facial swelling  . Ultram [Tramadol] Hives  . Adhesive [Tape]     blisters  . Cefaclor Rash    Ceclor  . Erythromycin     Gastritis, abd cramps  . Trimethoprim Rash  . Sulfa Antibiotics Rash    Social Hx:   History   Social History  . Marital Status: Married    Spouse Name: N/A  . Number of Children: N/A  . Years of Education: N/A   Occupational History  . Not on file.   Social History Main Topics  . Smoking status: Never Smoker   . Smokeless tobacco: Never Used  . Alcohol Use: Yes     Comment: rare social  . Drug Use: No  . Sexual Activity: Not Currently   Other Topics Concern  . Not on file   Social History Narrative    Past Surgical Hx:  Past Surgical History  Procedure Laterality Date  . Appendectomy    . Tonsillectomy    . Cholecystectomy      laparoscopic  . Eye surgery      pytosis of eyelids-child  . Colonoscopy with propofol N/A 08/21/2013    Procedure: COLONOSCOPY WITH PROPOFOL;  Surgeon: Cleotis Nipper, MD;  Location: [facility]  ENDOSCOPY;  Service: Endoscopy;  Laterality: N/A;  . Abdominal hysterectomy      staging for Ovarian cancer  . Eus N/A 10/02/2014    Procedure: LOWER ENDOSCOPIC ULTRASOUND (EUS);  Surgeon: Arta Silence, MD;  Location: [facility];  Service: Endoscopy;  Laterality: N/A;  . Robotic assisted lap vaginal hysterectomy N/A 11/19/2014    Procedure: ROBOTIC LYSIS OF ADHESIONS, CONVERTED TO LAPAROTOMY RADICAL UPPER VAGINECTOMY,LOW ANTERIOR BOWEL RESECTION, COLOSTOMY, BILATERAL URETERAL STENT PLACEMENT AND CYSTONOMY CLOSURE;  Surgeon: Everitt Amber, MD;  Location: [facility] ORS;  Service: Gynecology;  Laterality: N/A;  . Ostomy N/A 11/19/2014  Procedure: OSTOMY;  Surgeon: Michael [name], MD;  Location: [facility] ORS;  Service: General;  Laterality: N/A;  . Colostomy takedown N/A 12/04/2014    Procedure: LAPROSCOPIC LYSIS OF ADHESIONS, SPLENIC MOBILIZATION, RELOCATION OF COLOSTOMY, DEBRIDEMENT INITIAL COLOSTOMY SITE;  Surgeon: Michael [name], MD;  Location: [facility] ORS;  Service: General;  Laterality: N/A;    Past Medical Hx:  Past Medical History  Diagnosis Date  . Hypothyroidism 12/14/2011  . Benign essential HTN 12/14/2011  . Neutropenia   . Postmenopausal bleeding 09/05/2014  . Anemia     Planned Neupogen injections for chronic neutropenia  . Transfusion history     '06-s/p hysterectomy and staging  . Multiple thyroid nodules y-8    Managed by Dr. Harlow Asa  . GERD (gastroesophageal reflux disease)   . Polyp of duodenum y-2  . DM type 2 (diabetes mellitus, type 2) 12/14/2011  . Dysrhythmia     skipped beat with occasional coffee   . Hx of bronchitis   . Depression   . Hiatal hernia y-12    hx of small hiatal hernia   . Neutropenia     chronic   . Cancer     '06-Ovarian/ Uterine Cancer-chemotherapy  . 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 Patient has had a hysterectomy.  Family Hx:  Family History   Problem Relation Age of Onset  . Cancer Mother     stomach ca  . Hypertension Mother   . Cancer Father     prostate ca  . Diabetes Father   . Diabetes Sister   . Hypertension Brother y-10    Review of Systems:  Constitutional  Feels lethargic  ENT Normal appearing ears and nares bilaterally Skin/Breast  No rash, sores, jaundice, itching, dryness Cardiovascular  No chest pain, shortness of breath, or edema  Pulmonary  No cough or wheeze.  Gastro Intestinal  No nausea, vomitting, or diarrhoea. No bright red blood per rectum, no abdominal pain, change in bowel movement, or constipation.  Genito Urinary  No frequency, urgency, dysuria, Some decreased senstaiton to void. Musculo Skeletal  No myalgia, arthralgia, joint swelling or pain  Neurologic  No weakness, numbness, change in gait,  Psychology  No depression, anxiety, insomnia.   Vitals:  Blood pressure 126/59, pulse 85, temperature 99.2 F (37.3 C), temperature source Oral, resp. rate 16, height 5\' 2"  (1.575 m), weight 234 lb (106.142 kg), SpO2 100 %.  Physical Exam: WD in NAD Neck  Supple NROM, without any enlargements.  Lymph Node Survey deferred Cardiovascular  deferred Lungs  deferred Skin  deferred Psychiatry  Alert and oriented to person, place, and time  Abdomen  Normoactive bowel sounds, abdomen soft, non-tender and obese without evidence of hernia.well healed incision. Colostomy site with wet to dry dressings. No cellulitis visible. Back No CVA tenderness Genito Urinary  Vulva/vagina: deferred Rectal  deferred Extremities  No bilateral cyanosis, clubbing or edema.   Donaciano Eva, MD   12/31/2014, 6:46 PM

## 2014-12-31 NOTE — Progress Notes (Signed)
ANTIBIOTIC CONSULT NOTE - INITIAL  Pharmacy Consult for vancomycin, aztreonam Indication: sepsis, UTI  Allergies  Allergen Reactions  . Penicillins Swelling    Facial swelling  . Ultram [Tramadol] Hives  . Adhesive [Tape]     blisters  . Cefaclor Rash    Ceclor  . Erythromycin     Gastritis, abd cramps  . Trimethoprim Rash  . Sulfa Antibiotics Rash    Patient Measurements: Height: 5\' 2"  (157.5 cm) Weight: 234 lb 9.1 oz (106.4 kg) IBW/kg (Calculated) : 50.1 Adjusted Body Weight:   Vital Signs: Temp: 100.5 F (38.1 C) (05/30 1955) Temp Source: Oral (05/30 1955) BP: 111/44 mmHg (05/30 2300) Pulse Rate: 86 (05/30 2300) Intake/Output from previous day:   Intake/Output from this shift:    Labs:  Recent Labs  12/30/14 1913  WBC 13.3*  HGB 9.7*  PLT 347  CREATININE 0.82   Estimated Creatinine Clearance: 83.6 mL/min (by C-G formula based on Cr of 0.82). No results for input(s): VANCOTROUGH, VANCOPEAK, VANCORANDOM, GENTTROUGH, GENTPEAK, GENTRANDOM, TOBRATROUGH, TOBRAPEAK, TOBRARND, AMIKACINPEAK, AMIKACINTROU, AMIKACIN in the last 72 hours.   Microbiology: Recent Results (from the past 720 hour(s))  Surgical pcr screen     Status: None   Collection Time: 12/04/14 10:09 AM  Result Value Ref Range Status   MRSA, PCR NEGATIVE NEGATIVE Final   Staphylococcus aureus NEGATIVE NEGATIVE Final    Comment:        The Xpert SA Assay (FDA approved for NASAL specimens in patients over 41 years of age), is one component of a comprehensive surveillance program.  Test performance has been validated by [facility] for patients greater than or equal to 81 year old. It is not intended to diagnose infection nor to guide or monitor treatment.   Culture, Urine     Status: None   Collection Time: 12/10/14  1:45 PM  Result Value Ref Range Status   Specimen Description URINE, CATHETERIZED  Final   Special Requests cleocin, vancomycin Normal  Final   Colony Count NO  GROWTH Performed at [facility]   Final   Culture NO GROWTH Performed at [facility]   Final   Report Status 12/11/2014 FINAL  Final  TECHNOLOGIST REVIEW     Status: None   Collection Time: 12/13/14 12:35 PM  Result Value Ref Range Status   Technologist Review   Final    Rare Myelocyte, Occ Variant lymph, Large & giant platelets    Medical History: Past Medical History  Diagnosis Date  . Hypothyroidism 12/14/2011  . Benign essential HTN 12/14/2011  . Neutropenia   . Postmenopausal bleeding 09/05/2014  . Anemia     Planned Neupogen injections for chronic neutropenia  . Transfusion history     '06-s/p hysterectomy and staging  . Multiple thyroid nodules y-8    Managed by Dr. Harlow Asa  . GERD (gastroesophageal reflux disease)   . Polyp of duodenum y-2  . DM type 2 (diabetes mellitus, type 2) 12/14/2011  . Dysrhythmia     skipped beat with occasional coffee   . Hx of bronchitis   . Depression   . Hiatal hernia y-12    hx of small hiatal hernia   . Neutropenia     chronic   . Cancer     '06-Ovarian/ Uterine Cancer-chemotherapy  . 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    Medications:  Anti-infectives    Start     Dose/Rate Route Frequency Ordered Stop   12/31/14 0015  vancomycin (VANCOCIN) IVPB  1000 mg/200 mL premix     1,000 mg 200 mL/hr over 60 Minutes Intravenous 2 times daily 12/31/14 0005     12/31/14 0015  aztreonam (AZACTAM) 1 g in dextrose 5 % 50 mL IVPB     1 g 100 mL/hr over 30 Minutes Intravenous 3 times per day 12/31/14 0005     12/30/14 2015  ciprofloxacin (CIPRO) IVPB 400 mg     400 mg 200 mL/hr over 60 Minutes Intravenous  Once 12/30/14 2006 12/30/14 2257   12/30/14 2015  metroNIDAZOLE (FLAGYL) IVPB 500 mg     500 mg 100 mL/hr over 60 Minutes Intravenous  Once 12/30/14 2006 12/30/14 2144     Assessment: Patient with sepsis possible from ureteral stenting.  Goal of Therapy:  Vancomycin trough level 15-20 mcg/ml  Aztreonam dosed  based on patient weight and renal function   Plan:  Measure antibiotic drug levels at steady state Follow up culture results Vancomycin 1gm iv q12hr  Aztreonam 1gm iv q8hr  Tyler Deis, Amberleigh Gerken Crowford 12/31/2014,12:13 AM

## 2014-12-31 NOTE — Progress Notes (Signed)
Initial Nutrition Assessment  DOCUMENTATION CODES:  Morbid obesity  INTERVENTION:  Encourage PO intake (small frequent meals) Modified lunch order per pt request RD to continue to monitor  NUTRITION DIAGNOSIS:  Unintentional weight loss related to other (see comment) (taste changes) as evidenced by 10 percent weight loss x 3 weeks.  GOAL:  Patient will meet greater than or equal to 90% of their needs  MONITOR:  PO intake, Labs, Weight trends, Skin, I & O's  REASON FOR ASSESSMENT:  Malnutrition Screening Tool    ASSESSMENT: 60 y.o. female 59 y.o. year old with recurrent endometrioid endometrial/ovarian cancer. S/p exploratory laparotomy, posterior supralevator pelvic exenteration, end colostomy, bladder repair, ureteral stenting with complete resection and negative margins on 11/09/14.  Pt reports eating PTA but has been experiencing some taste changes. Pt states she has been using magic mouthwash at home and this has helped.  Pt has lost 27 lb since 5/12 (10% weight loss x 3 weeks). Pt not concerned with weight loss, states she wants to lose weight. Encouraged pt to consume small frequent meals as she states she cannot eat much at one time.   Pt's diet has been changed to CHO modified diet, requested RD change her lunch order to reflect new diet. RD modified lunch order for patient.  Labs reviewed: Low K, Mg Glucose 152  Height:  Ht Readings from Last 1 Encounters:  12/30/14 5\' 2"  (1.575 m)    Weight:  Wt Readings from Last 1 Encounters:  12/31/14 234 lb (106.142 kg)    Ideal Body Weight:  50 kg  Wt Readings from Last 10 Encounters:  12/31/14 234 lb (106.142 kg)  12/25/14 237 lb 8 oz (107.729 kg)  12/16/14 250 lb (113.399 kg)  12/12/14 261 lb (118.389 kg)  12/02/14 250 lb (113.399 kg)  11/29/14 254 lb 1.6 oz (115.259 kg)  11/29/14 252 lb 12.8 oz (114.669 kg)  11/24/14 265 lb 4.8 oz (120.339 kg)  11/11/14 250 lb 9.6 oz (113.671 kg)  10/18/14 250 lb 6.4  oz (113.581 kg)    BMI:  Body mass index is 42.79 kg/(m^2).  Estimated Nutritional Needs:  Kcal:  1600-1800  Protein:  60-70g  Fluid:  1.6L/day     Skin:  Reviewed, no issues  Diet Order:  Diet Carb Modified Fluid consistency:: Thin; Room service appropriate?: Yes  EDUCATION NEEDS:  No education needs identified at this time   Intake/Output Summary (Last 24 hours) at 12/31/14 1141 Last data filed at 12/31/14 0900  Gross per 24 hour  Intake   1440 ml  Output   2500 ml  Net  -1060 ml    Last BM:  5/31  Clayton Bibles, MS, RD, LDN Pager: [phone] After Hours Pager: [phone]

## 2015-01-01 ENCOUNTER — Telehealth: Payer: Self-pay | Admitting: Oncology

## 2015-01-01 ENCOUNTER — Telehealth: Payer: Self-pay | Admitting: Gynecologic Oncology

## 2015-01-01 ENCOUNTER — Ambulatory Visit
Admission: RE | Admit: 2015-01-01 | Discharge: 2015-01-01 | Disposition: A | Discharge status: Home / Self Care | Payer: BLUE CROSS/BLUE SHIELD | Source: Ambulatory Visit | Attending: Radiation Oncology | Admitting: Radiation Oncology

## 2015-01-01 ENCOUNTER — Encounter: Payer: Self-pay | Admitting: Radiation Oncology

## 2015-01-01 VITALS — Ht 62.0 in | Wt 239.9 lb

## 2015-01-01 DIAGNOSIS — F329 Major depressive disorder, single episode, unspecified: Secondary | ICD-10-CM | POA: Insufficient documentation

## 2015-01-01 DIAGNOSIS — N390 Urinary tract infection, site not specified: Secondary | ICD-10-CM

## 2015-01-01 DIAGNOSIS — C549 Malignant neoplasm of corpus uteri, unspecified: Secondary | ICD-10-CM

## 2015-01-01 DIAGNOSIS — E119 Type 2 diabetes mellitus without complications: Secondary | ICD-10-CM

## 2015-01-01 DIAGNOSIS — A419 Sepsis, unspecified organism: Principal | ICD-10-CM

## 2015-01-01 DIAGNOSIS — K219 Gastro-esophageal reflux disease without esophagitis: Secondary | ICD-10-CM | POA: Insufficient documentation

## 2015-01-01 DIAGNOSIS — Z510 Encounter for antineoplastic radiation therapy: Secondary | ICD-10-CM | POA: Diagnosis not present

## 2015-01-01 DIAGNOSIS — E039 Hypothyroidism, unspecified: Secondary | ICD-10-CM | POA: Insufficient documentation

## 2015-01-01 DIAGNOSIS — C541 Malignant neoplasm of endometrium: Secondary | ICD-10-CM | POA: Insufficient documentation

## 2015-01-01 DIAGNOSIS — Z90710 Acquired absence of both cervix and uterus: Secondary | ICD-10-CM | POA: Insufficient documentation

## 2015-01-01 DIAGNOSIS — I10 Essential (primary) hypertension: Secondary | ICD-10-CM | POA: Insufficient documentation

## 2015-01-01 DIAGNOSIS — Z794 Long term (current) use of insulin: Secondary | ICD-10-CM | POA: Insufficient documentation

## 2015-01-01 DIAGNOSIS — Z9049 Acquired absence of other specified parts of digestive tract: Secondary | ICD-10-CM | POA: Insufficient documentation

## 2015-01-01 LAB — BASIC METABOLIC PANEL
Anion gap: 12 (ref 5–15)
BUN: 16 mg/dL (ref 6–20)
CALCIUM: 8.3 mg/dL — AB (ref 8.9–10.3)
CHLORIDE: 102 mmol/L (ref 101–111)
CO2: 26 mmol/L (ref 22–32)
Creatinine, Ser: 0.76 mg/dL (ref 0.44–1.00)
GFR calc Af Amer: >60 mL/min (ref >60)
Glucose, Bld: 142 mg/dL — ABNORMAL HIGH (ref 65–99)
Potassium: 2.7 mmol/L — CL (ref 3.5–5.1)
SODIUM: 140 mmol/L (ref 135–145)

## 2015-01-01 LAB — CBC
HEMATOCRIT: 27 % — AB (ref 36.0–46.0)
Hemoglobin: 8.3 g/dL — ABNORMAL LOW (ref 12.0–15.0)
MCH: 26.6 pg (ref 26.0–34.0)
MCHC: 30.7 g/dL (ref 30.0–36.0)
MCV: 86.5 fL (ref 78.0–100.0)
PLATELETS: 277 10*3/uL (ref 150–400)
RBC: 3.12 MIL/uL — AB (ref 3.87–5.11)
RDW: 16.1 % — ABNORMAL HIGH (ref 11.5–15.5)
WBC: 4.1 10*3/uL (ref 4.0–10.5)

## 2015-01-01 LAB — CLOSTRIDIUM DIFFICILE BY PCR: Toxigenic C. Difficile by PCR: NEGATIVE

## 2015-01-01 LAB — URINE CULTURE: Colony Count: >=100000

## 2015-01-01 LAB — GLUCOSE, CAPILLARY
GLUCOSE-CAPILLARY: 133 mg/dL — AB (ref 65–99)
Glucose-Capillary: 188 mg/dL — ABNORMAL HIGH (ref 65–99)
Glucose-Capillary: 130 mg/dL — ABNORMAL HIGH (ref 65–99)
Glucose-Capillary: 179 mg/dL — ABNORMAL HIGH (ref 65–99)

## 2015-01-01 LAB — POTASSIUM: Potassium: 3.5 mmol/L (ref 3.5–5.1)

## 2015-01-01 MED ORDER — POTASSIUM CHLORIDE 20 MEQ/15ML (10%) PO SOLN
40.0000 meq | Freq: Once | ORAL | Status: AC
  Administered 2015-01-01: 40 meq via ORAL
  Filled 2015-01-01: qty 30

## 2015-01-01 MED ORDER — MAGIC MOUTHWASH W/LIDOCAINE
10.0000 mL | Freq: Four times a day (QID) | ORAL | Status: DC | PRN
  Filled 2015-01-01: qty 10

## 2015-01-01 MED ORDER — MAGNESIUM CHLORIDE 64 MG PO TBEC
2.0000 | DELAYED_RELEASE_TABLET | Freq: Two times a day (BID) | ORAL | Status: DC
  Administered 2015-01-01 – 2015-01-02 (×3): 128 mg via ORAL
  Filled 2015-01-01 (×4): qty 2

## 2015-01-01 MED ORDER — SACCHAROMYCES BOULARDII 250 MG PO CAPS
250.0000 mg | ORAL_CAPSULE | Freq: Two times a day (BID) | ORAL | Status: DC
  Administered 2015-01-01 – 2015-01-02 (×3): 250 mg via ORAL
  Filled 2015-01-01 (×4): qty 1

## 2015-01-01 MED ORDER — POTASSIUM CHLORIDE 20 MEQ/15ML (10%) PO SOLN
40.0000 meq | Freq: Once | ORAL | Status: DC
  Filled 2015-01-01: qty 30

## 2015-01-01 MED ORDER — POTASSIUM CHLORIDE CRYS ER 20 MEQ PO TBCR
30.0000 meq | EXTENDED_RELEASE_TABLET | Freq: Once | ORAL | Status: AC
  Administered 2015-01-01: 30 meq via ORAL
  Filled 2015-01-01: qty 1

## 2015-01-01 NOTE — Telephone Encounter (Signed)
Tawanna Solo, RN on [facility] to let her know that Lakeisha's consultation for today will be canceled and will be rescheduled when she is discharged.  Butch Penny verbalized agreement and will call if patient is discharged today.

## 2015-01-01 NOTE — Progress Notes (Signed)
Patient took the K 10meq liquid.

## 2015-01-01 NOTE — Progress Notes (Signed)
Please see the Nurse Progress Note in the MD Initial Consult Encounter for this patient. 

## 2015-01-01 NOTE — Telephone Encounter (Signed)
Butch Penny from [facility] called and said that Samay would like to keep her appointment today with Dr. Sondra Come. They will have a transporter bring her to radiation at 2 pm.  Dr. Sondra Come had been notified.

## 2015-01-01 NOTE — Progress Notes (Signed)
Patient c/o diarrhea,this started this am. C-ff nurse driven screening protocol initated. Will continue to monitor the patient.

## 2015-01-01 NOTE — Telephone Encounter (Signed)
Patient currently inpatient.  Called the patient's room but no answer.  Message left on cell phone about CT findings and that the scheduled CT urogram was cancelled since she had CT AP earlier this week.  Advised that Dr. Denman [name] is planning on keeping the stents in at this time due to continued hydronephrosis.  Advised to call the office for any questions or concerns.

## 2015-01-01 NOTE — Progress Notes (Signed)
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  - two lymph nodes, negative for atypia or malignancy (0/2).   - resection margins, negative for atypia or malignancy.  HISTORY OF PRESENT ILLNESS::[name] is a 59 y.o. female who presents today with husband for consult for consideration for postoperative radiation therapy as part of management of patient's recurrent endometrial cancer. Patient is seen out courtesy of Dr. Everitt [name]. She is currently admitted to the hospital for management of UTI with associated sepsis. This is secondary to gram-negative rods on urine culture.  "She has a history of endometrial and ovarian endometrioid carcinoma treated in 2006 by Dr. [name] at [facility] in [place]. Her surgery (TAH, BSO) was followed by adjuvant chemotherapy with carboplatin plus paclitaxel due to the ovarian involvement and the presence of a cul de sac lesion also positive for disease. She denies receiving adjuvant radiation therapy. It is unclear if she had metastatic endometrial cancer to the ovary or duel primaries. She had a complete response to therapy however developed leukopenia in July 2010. 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 She sees Dr. Alvy Bimler for this and is treated with G-CSF injections. Patient said she had some spotting and bleeding from  vaginal area. In February of this year the patient underwent a CT scan of abdomen and pelvis revealed a new pelvic mass abutting the vaginal cuff and rectum, suspicious for recurrent ovarian or endometrial cancer. The patient ultimately underwent supralevator posterior extenteration with bladder repair and placement of ureteral stents. Her postop course was complicated by a perforation from her colostomy wall which needed repositioning of the colostomy in the upper abdomen.  Ideally the patient would treat with chemotherapy additionally to minimize systemic/distant relapse, however with her chronic idiopathic neutropenia (after previously receiving chemotherapy)  concerns about the toxicity of this for her,  reserving chemotherapy for situations of second relapse.   Given the significant recurrence within the pelvis it was felt that the patient was at risk for additional recurrence in this area and postoperative radiation therapy has been recommended. Patient was initially scheduled for outpatient consultation this week however was admitted for above issues. Patient is now seen as an inpatient for consideration for postoperative treatments.  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    PAST SURGICAL HISTORY: Past Surgical History  Procedure Laterality Date  . Appendectomy    . Tonsillectomy    . Cholecystectomy      laparoscopic  . Eye surgery      pytosis of eyelids-child  . Colonoscopy with propofol N/A 08/21/2013    Procedure: COLONOSCOPY WITH  PROPOFOL;  Surgeon: Cleotis Nipper, MD;  Location: [facility];  Service: Endoscopy;  Laterality: N/A;  . Abdominal hysterectomy  2006    staging for Ovarian cancer  . Eus N/A 10/02/2014    Procedure: LOWER ENDOSCOPIC ULTRASOUND (EUS);  Surgeon: Arta Silence, MD;  Location: [facility];  Service: Endoscopy;  Laterality: N/A;  . Robotic assisted lap vaginal hysterectomy N/A 11/19/2014    Procedure: ROBOTIC LYSIS OF ADHESIONS, CONVERTED TO LAPAROTOMY RADICAL UPPER VAGINECTOMY,LOW ANTERIOR BOWEL RESECTION, COLOSTOMY, BILATERAL URETERAL STENT PLACEMENT AND CYSTONOMY CLOSURE;  Surgeon: [name] Amber, MD;  Location: [facility];  Service: Gynecology;  Laterality: N/A;  . Ostomy N/A 11/19/2014    Procedure: OSTOMY;  Surgeon: Michael [name], MD;  Location: [facility];  Service: General;  Laterality: N/A;  . Colostomy takedown N/A 12/04/2014    Procedure: LAPROSCOPIC LYSIS OF ADHESIONS, SPLENIC MOBILIZATION, RELOCATION OF COLOSTOMY, DEBRIDEMENT INITIAL COLOSTOMY SITE;  Surgeon: Michael [name], MD;  Location: [facility];  Service: General;  Laterality: N/A;    FAMILY HISTORY: family history includes Cancer in her father and mother; Diabetes in her father and sister; Hypertension in her mother; Hypertension (age of onset: y-10) in her brother.  SOCIAL HISTORY:  reports that she has never smoked. She has never used smokeless tobacco. She reports that she drinks alcohol. She reports that she does not use illicit drugs. retired Counselling psychologist,   ALLERGIES: Penicillins; Ultram; Adhesive; Cefaclor; Erythromycin; Trimethoprim; and Sulfa antibiotics  MEDICATIONS:  No current facility-administered medications for this encounter.   No current outpatient prescriptions on file.   Facility-Administered Medications Ordered in Other Encounters  Medication Dose Route Frequency Provider Last Rate Last Dose  . acetaminophen (TYLENOL) tablet 650 mg  650 mg Oral Q6H PRN Rise Patience, MD       Or  . acetaminophen  (TYLENOL) suppository 650 mg  650 mg Rectal Q6H PRN Rise Patience, MD      . aztreonam (AZACTAM) 1 g in dextrose 5 % 50 mL IVPB  1 g Intravenous 3 times per day Rise Patience, MD   1 g at 01/01/15 [phone]  . canagliflozin (INVOKANA) tablet 300 mg  300 mg Oral q morning - 10a Rise Patience, MD   300 mg at 01/01/15 8841  . enoxaparin (LOVENOX) injection 40 mg  40 mg Subcutaneous QHS Rise Patience, MD   40 mg at 12/30/14 2356  . ferrous sulfate tablet 325 mg  325 mg Oral BID WC Rise Patience, MD   325 mg at 01/01/15 6606  . insulin aspart (novoLOG) injection 0-9 Units  0-9 Units Subcutaneous TID WC Rise Patience, MD   1 Units at 01/01/15 1200  . levothyroxine (SYNTHROID, LEVOTHROID) tablet 175 mcg  175 mcg Oral QAC breakfast Rise Patience, MD   175 mcg at 01/01/15 3016  . linagliptin (TRADJENTA) tablet 5 mg  5 mg Oral Daily Rise Patience, MD   5 mg at 01/01/15 0109  . magic mouthwash w/lidocaine  10 mL Oral QID PRN Janece Canterbury, MD      . multivitamin with minerals tablet 1 tablet  1 tablet Oral Daily Rise Patience, MD   1 tablet at 01/01/15 [phone]  . omega-3 acid ethyl esters (LOVAZA) capsule 1 g  1 g Oral BID Rise Patience, MD   1 g at 01/01/15 [phone]  . ondansetron (ZOFRAN) tablet 4 mg  4 mg Oral Q6H PRN Rise Patience, MD       Or  . ondansetron [facility]) injection 4  mg  4 mg Intravenous Q6H PRN Rise Patience, MD      . ondansetron [facility]) tablet 4 mg  4 mg Oral Q6H PRN Rise Patience, MD      . oxyCODONE (Oxy IR/ROXICODONE) immediate release tablet 5-10 mg  5-10 mg Oral Q4H PRN Rise Patience, MD      . pantoprazole (PROTONIX) EC tablet 40 mg  40 mg Oral Daily Rise Patience, MD   40 mg at 01/01/15 1761  . potassium chloride 20 MEQ/15ML (10%) solution 40 mEq  40 mEq Oral Once Jeryl Columbia, NP   40 mEq at 01/01/15 0720  . rosuvastatin (CRESTOR) tablet 10 mg  10 mg Oral QPM Rise Patience, MD   10 mg at  12/31/14 1809  . saccharomyces boulardii (FLORASTOR) capsule 250 mg  250 mg Oral BID Janece Canterbury, MD      . Tbo-Filgrastim [facility]) injection 480 mcg  480 mcg Subcutaneous Weekly Heath Lark, MD   480 mcg at 11/29/14 1338    REVIEW OF SYSTEMS:  A 15 point review of systems is documented in the electronic medical record. This was obtained by the nursing staff. However, I reviewed this with the patient to discuss relevant findings and make appropriate changes.     PHYSICAL EXAM: Vitals - 1 value per visit 6/0/7371  SYSTOLIC 062  DIASTOLIC 61  Pulse 83  Temperature 98.4  Respirations 16  Weight (lb) 237  Height   BMI   VISIT REPORT      height is 5' 2"  (1.575 m) and weight is 239 lb 14.4 oz (108.818 kg).   Lungs are clear. Heart has regular rate and rhythm. No palpable cervical, supraclavicular, or axillary adenopathy. Moist oral mucosa, disconjugate vision/. Good strength in upper and lower extremities. Decrease sensation around the surgical area. Patient has a colostomy in place in the left upper abdominal area which appears to be functioning well. Inferior to this area is a small scar from her prior colostomy placement site. She has long vertical scar in the lower abdomen/pelvis area which is healing well without signs of drainage or infection. No inguinal adenopathy is appreciated. A pelvic exam is not performed in light of recent events  ECOG = 2  2 - Symptomatic, <50% in bed during the day (Ambulatory and capable of all self care but unable to carry out any work activities. Up and about more than 50% of waking hours)   LABORATORY DATA:  Lab Results  Component Value Date   WBC 4.1 01/01/2015   HGB 8.3* 01/01/2015   HCT 27.0* 01/01/2015   MCV 86.5 01/01/2015   PLT 277 01/01/2015   NEUTROABS 6.1 12/31/2014   Lab Results  Component Value Date   NA 140 01/01/2015   K 3.5 01/01/2015   CL 102 01/01/2015   CO2 26 01/01/2015   GLUCOSE 142* 01/01/2015   CREATININE 0.76  01/01/2015   CALCIUM 8.3* 01/01/2015    RADIOGRAPHY: Ct Abdomen Pelvis W Contrast  12/30/2014   CLINICAL DATA:  Not feeling well for 2 days with fever and chills beginning today. Several procedures recently. Chronic intracranial with endometrial, ovarian and uterine carcinoma. 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  EXAM: CT ABDOMEN AND PELVIS WITH CONTRAST  TECHNIQUE: Multidetector CT imaging of the abdomen and pelvis was performed using the standard protocol following bolus administration of intravenous contrast.  CONTRAST:  129m OMNIPAQUE IOHEXOL 300 MG/ML  SOLN  COMPARISON:  12/02/2014  FINDINGS:  Lung bases are within normal.  Abdominal images demonstrate evidence of a previous cholecystectomy. The liver, spleen, pancreas and adrenal glands are normal.  Kidneys are normal size and demonstrate mild bilateral hydronephrosis with the presence of bilateral internal ureteral stents which are in adequate position and unchanged. Delayed images demonstrate contrast in the right intrarenal collecting system but only a scant amount of contrast in the left intrarenal collecting system. Further delayed images demonstrate no contrast in the bladder.  Evidence of patient's colostomy site over the left mid abdomen unchanged. Persistent minimal stranding of the fat adjacent to the ostomy as cannot exclude mild soft tissue infection. No evidence of abscess. Interval resolution of the previous noted subcutaneous air over the abdominal wall. New area of stranding of the subcutaneous fat over the left anterior abdominal wall below the ostomy site which may be due to edema versus soft tissue infection.  Pelvic images demonstrate surgical suture line over the anorectal junction with stable postsurgical changes over the posterior pelvis. A small bowel loop courses into the region of the surgical bed. Bladder is unremarkable. Remainder the exam is unchanged.  IMPRESSION: Stable postsurgical changes  over the posterior pelvis likely related to recent excision of recurrent endometrial carcinoma.  Colostomy site over the left mid abdomen with persistent minimal stranding of the fat adjacent to the ostomy as cannot exclude soft tissue infection. No abscess. Second site of stranding of the subcutaneous fat over the left anterior abdominal wall below the ostomy site which again may represent mild edema versus soft tissue infection.  Bilateral double-J internal ureteral stents in adequate position with persistent mild bilateral hydronephrosis.   Electronically Signed   By: Marin Olp M.D.   On: 12/30/2014 21:48   Ct Abdomen Pelvis W Contrast  12/03/2014   CLINICAL DATA:  Recurrent endometrial carcinoma. Stomal infection in setting of chronic neutropenia.  EXAM: CT ABDOMEN AND PELVIS WITH CONTRAST  TECHNIQUE: Multidetector CT imaging of the abdomen and pelvis was performed using the standard protocol following bolus administration of intravenous contrast.  CONTRAST:  23m OMNIPAQUE IOHEXOL 300 MG/ML SOLN, 1050mOMNIPAQUE IOHEXOL 300 MG/ML SOLN  COMPARISON:  MRI 10/14/2014  FINDINGS: In the upper abdomen, there is soft tissue gas at the superficial aspect of the abdominal wall musculature in the right upper quadrant, left upper quadrant and right para midline epigastric region. No significant inflammatory changes or fluid are evident in these areas.  In the abdominal left lower quadrant there is a mid descending colostomy. There is inflammatory stranding in the subcutaneous soft tissues around the ostomy. There is soft tissue gas at the lateral aspect of the stoma, but no evidence of a drainable collection.  There is a small volume peritoneal fluid in the right pericolic gutter, but no evidence of a contained fluid collection or abscess in the abdomen or pelvis. There is is a peritoneal drain which does not pass through this portion of the right pericolic gutter. There are bilateral ureteral stents. There is mild  bilateral hydronephrosis.  There are normal appearances of the liver, with no evidence of hepatic metastasis. There are normal appearances of the spleen, pancreas, and adrenals. Stomach and small bowel appear unremarkable. The oral contrast has passed through to the distal ileum with no evidence of bowel obstruction.  There is no abdominal adenopathy. There is postoperative stranding in the pelvis, particularly in the presacral region.  There is no significant abnormality in the lower chest.  IMPRESSION: *Inflammatory stranding and soft tissue gas around the  left lower quadrant ostomy, particularly at its lateral aspect. No drainable collection. This likely represents cellulitis. *In the upper abdomen, there is soft tissue gas at the superficial aspect of the abdominal wall musculature without significant inflammatory change or fluid. This might relate to the recent surgery but cannot be characterized. *Small volume peritoneal fluid in the right pericolic gutter. The peritoneal JP drain does not pass through this portion of the gutter. No contained collection or abscess is evident. *Mild bilateral hydronephrosis. Satisfactorily positioned ureteral stents bilaterally.   Electronically Signed   By: Andreas Newport M.D.   On: 12/03/2014 01:38   Dg Cystogram  12/11/2014   CLINICAL DATA:  59 year old female status post bladder repair and tumor removal 3 weeks ago.  EXAM: CYSTOGRAM  TECHNIQUE: After catheterization of the urinary bladder following sterile technique the bladder was filled with 200 mL Cysto-Hypaque 30% by drip infusion. Serial spot images were obtained during bladder filling and post draining.  FLUOROSCOPY TIME:  3 minutes and 1 second  COMPARISON:  No priors.  FINDINGS: Initial images of the pelvis demonstrated the distal ends of bilateral double-J ureteral stents, and demonstrated multiple surgical clips. Urinary bladder was normal in appearance, without evidence of extravasation at any point during  the examination.  IMPRESSION: 1. Normal cystogram, without evidence of extravasation into the peritoneal or subperitoneal spaces.   Electronically Signed   By: Vinnie Langton M.D.   On: 12/11/2014 12:11   Dg Abd Acute W/chest  12/30/2014   CLINICAL DATA:  Fever/ chills x3 days, nausea  EXAM: DG ABDOMEN ACUTE W/ 1V CHEST  COMPARISON:  CT abdomen pelvis dated 12/02/2014. Chest radiographs dated 11/20/2014.  FINDINGS: Lungs are clear.  No pleural effusion or pneumothorax.  The heart is normal in size.  Nonobstructive bowel gas pattern. Moderate colonic stool burden. Left mid abdominal colostomy.  No evidence of free air under the diaphragm on the upright view.  Bilateral ureteral stents.  Cholecystectomy clips. Multiple surgical clips overlying the lower abdomen/pelvis.  IMPRESSION: No evidence of acute cardiopulmonary disease.  No evidence of small bowel obstruction or free air.  Moderate colonic stool burden.  Surgical changes, as above.   Electronically Signed   By: Julian Hy M.D.   On: 12/30/2014 19:48   Dg Abd Portable 2v  12/07/2014   CLINICAL DATA:  Nausea and vomiting.  Abdominal pain today.  EXAM: PORTABLE ABDOMEN - 2 VIEW  COMPARISON:  12/02/2014.  FINDINGS: Bilateral double-J ureteral stents are present with distal loops reconstituted in the urinary bladder. Partial reconstitution of the LEFT renal pelvis loop. Surgical clips compatible with pelvic lymphadenectomy. Tubing projects over the abdomen. There is no gross plain film evidence of free air. Cholecystectomy clips are present in the right upper quadrant. Large stool burden. Soft tissue emphysema is present along the LEFT side of the abdomen, likely residual gas from prior CT 12/02/2014.  IMPRESSION: 1. 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 2. No gross plain film evidence of free air. 3. Large stool burden. 4. Double-J ureteral stents and postsurgical changes in the abdomen and pelvis.   Electronically Signed   By:  Dereck Ligas M.D.   On: 12/07/2014 19:20     11/19/14 -Procedure: ROBOTIC LYSIS OF ADHESIONS, CONVERTED TO LAPAROTOMY RADICAL UPPER VAGINECTOMY,LOW ANTERIOR BOWEL RESECTION, COLOSTOMY, BILATERAL URETERAL STENT PLACEMENT AND CYSTONOMY CLOSURE; Surgeon: [name] Amber, MD; Location: [facility]; Service: Gynecology; Laterality: N/A; 11/19/14 -Procedure: OSTOMY; Surgeon: Michael [name], MD; Location: [facility]; Service: General; Laterality: N/A;  12/04/14 - Procedure: LAPROSCOPIC LYSIS OF ADHESIONS, SPLENIC MOBILIZATION, RELOCATION OF COLOSTOMY, DEBRIDEMENT INITIAL COLOSTOMY SITE; Surgeon: Michael [name], MD; Location: [facility]; Service: General; Laterality: N/A;     IMPRESSION:  Recurrent endometrial cancer. The patient at risk for addition recurrence. I agree with recommendation of postoperative radiation therapy. Will speak with Dr. Denman [name] with time frame of start of radiation therapy. Discussed treatment course, side effects, and complications of treatment with patient and husband. She appears to understand and wants to proceed with the course of treatment. She understands that she would be at increased risk for bowel complications from her radiation therapy given her 3 previous pelvic surgeries.  PLAN: IMRT simulation in the near future. I would recommend IMRT to  more accurately cover the operative site and to reduce chances of complications of radiation therapy with reduction in small bowel dose with this treatment approach. Patient will need continued blood work weekly in light of her problems with neutropenia.   This document serves as a record of services personally performed by Gery Pray, MD. It was created on his behalf by Jeralene Peters, a trained medical scribe. The creation of this record is based on the scribe's personal observations and the provider's statements to them. This document has been checked and approved by the attending provider.         ------------------------------------------------  Blair Promise, PhD, MD

## 2015-01-01 NOTE — Progress Notes (Signed)
CRITICAL VALUE ALERT  Critical value received:  Potassium 2.7  Date of notification:  01/01/15  Time of notification:  0504  Critical value read back:Yes.    Nurse who received alert:  Otho Bellows RN  MD notified (1st page):  Chaney Malling NP  Time of first page:  [phone]  Responding MD:  Chaney Malling NP  Time MD responded:  [phone]

## 2015-01-01 NOTE — Progress Notes (Signed)
[facility] PROGRESS NOTE  [name] [id number] DOB: 15-Jan-1956 DOA: 12/30/2014 PCP: MATTHEWS,MICHELLE A., MD  Assessment/Plan: 1. Sepsis with UTI, GNR on urine culture 1. Improved with aztreonam 2. BCx NGTD 3. UCx with GNR, awaiting speciation and sensitivities 4. D/c vanc since suspect urosepsis with GNR 2. Hypokalemia 1. Replaced but still low this morning 2. Repeat potassium level at noon today and continue supplementation if needed 3. Hypomagnesemia  1. Mg of 1.2 2. Repeat dose of magnesium 2gm IV once today and will recommend supplementation as outpatient 4. DM2 1. Glucose overall stable 2. Cont SSI coverage 5. HTN 1. BP stable 6. Chronic anemia 1. hgb stable 2. Cont to monitor 7. Hypothyroid 1. Pt is continued on thyroid replacement 8. Metastatic endometrial/ovarian cancer 1. Pt is followed by Oncology 2. Pt reports having multiple follow up visits this week 3. Rad ONC visit today 9. DVT prophylaxis 1. Lovenox subQ  Code Status: Full Family Communication: Pt in room (indicate person spoken with, relationship, and if by phone, the number) Disposition Plan:  Possibly home this afternoon if her urine culture is reported and her potassium level has improved.  More likely, anticipate home tomorrow.     Consultants:  GYN ONC  Procedures:  CT abd/pelvis  Antibiotics:  Aztreonam 5/31>>>   Vancomycin 5/31 >> 6/1  HPI/Subjective: Feels better.  Back pain has improved.  Does not want to bounce back.  Had some nausea with her oral potassium and feels puffy.  Had looser stools today which were tested for C. Diff and were negative.    Objective: Filed Vitals:   12/31/14 0940 12/31/14 1405 12/31/14 2054 01/01/15 0436  BP: 113/59 126/59 115/58 125/52  Pulse: 87 85 82 83  Temp: 98.7 F (37.1 C) 99.2 F (37.3 C) 98 F (36.7 C) 98.6 F (37 C)  TempSrc: Oral Oral Oral Oral  Resp: 18 16 16 16   Height:      Weight:    107.502 kg (237 lb)  SpO2: 100%  100% 100% 95%    Intake/Output Summary (Last 24 hours) at 01/01/15 1258 Last data filed at 01/01/15 1200  Gross per 24 hour  Intake   2910 ml  Output   5300 ml  Net  -2390 ml   Filed Weights   12/30/14 2321 12/31/14 0503 01/01/15 0436  Weight: 106.4 kg (234 lb 9.1 oz) 106.142 kg (234 lb) 107.502 kg (237 lb)    Exam:   General:  Awake, in nad, sitting at edge of bed  Cardiovascular: regular, s1, s2  Respiratory: normal resp effort, no wheezing, rales, or rhonchi  Abdomen: soft, obese, nondistended, mild TTP in RLQ without rebound or guarding.  Has ostomy with mostly liquid stool in LLQ and bandage over a healing previous ostomy scar which I did not observe today, also in the LLQ  Musculoskeletal: perfused, no clubbing, mild nonpitting edema of hands and feet  Data Reviewed: Basic Metabolic Panel:  Recent Labs Lab 12/30/14 1913 12/31/14 0004 12/31/14 0638 01/01/15 0345  NA 137  --  135 140  K 2.8*  --  2.8* 2.7*  CL 98*  --  99* 102  CO2 27  --  25 26  GLUCOSE 214*  --  156* 142*  BUN 21*  --  15 16  CREATININE 0.82 0.86 0.76 0.76  CALCIUM 8.9  --  7.9* 8.3*  MG  --  1.2*  --   --    Liver Function Tests:  Recent Labs Lab 12/30/14 1913  12/31/14 0638  AST 15 11*  ALT 16 12*  ALKPHOS 69 57  BILITOT 0.4 0.6  PROT 7.5 6.7  ALBUMIN 3.2* 2.8*   No results for input(s): LIPASE, AMYLASE in the last 168 hours. No results for input(s): AMMONIA in the last 168 hours. CBC:  Recent Labs Lab 12/27/14 1523 12/30/14 1913 12/31/14 0004 12/31/14 [id number] 01/01/15 0345  WBC 3.7* 13.3* 9.4 8.6 4.1  NEUTROABS 1.4* 11.2*  --  6.1  --   HGB 10.4* 9.7* 8.9* 8.7* 8.3*  HCT 33.7* 31.9* 29.5* 28.0* 27.0*  MCV 86.6 84.8 85.0 85.9 86.5  PLT 358 347 294 283 277   Cardiac Enzymes: No results for input(s): CKTOTAL, CKMB, CKMBINDEX, TROPONINI in the last 168 hours. BNP (last 3 results) No results for input(s): BNP in the last 8760 hours.  ProBNP (last 3 results) No  results for input(s): PROBNP in the last 8760 hours.  CBG:  Recent Labs Lab 12/31/14 1139 12/31/14 1719 12/31/14 2129 01/01/15 0816 01/01/15 1211  GLUCAP 151* 102* 148* 188* 133*    Recent Results (from the past 240 hour(s))  Culture, blood (routine x 2)     Status: None (Preliminary result)   Collection Time: 12/30/14  6:46 PM  Result Value Ref Range Status   Specimen Description BLOOD RAC  Final   Special Requests BOTTLES DRAWN AEROBIC AND ANAEROBIC 5CC  Final   Culture   Final           BLOOD CULTURE RECEIVED NO GROWTH TO DATE CULTURE WILL BE HELD FOR 5 DAYS BEFORE ISSUING A FINAL NEGATIVE REPORT Performed at [facility]    Report Status PENDING  Incomplete  Culture, blood (routine x 2)     Status: None (Preliminary result)   Collection Time: 12/30/14  7:00 PM  Result Value Ref Range Status   Specimen Description BLOOD LAC  Final   Special Requests BOTTLES DRAWN AEROBIC AND ANAEROBIC 5CC  Final   Culture   Final           BLOOD CULTURE RECEIVED NO GROWTH TO DATE CULTURE WILL BE HELD FOR 5 DAYS BEFORE ISSUING A FINAL NEGATIVE REPORT Performed at [facility]    Report Status PENDING  Incomplete  Urine culture     Status: None (Preliminary result)   Collection Time: 12/30/14  7:19 PM  Result Value Ref Range Status   Specimen Description URINE, CLEAN CATCH  Final   Special Requests NONE  Final   Colony Count   Final    >=100,000 COLONIES/ML Performed at [facility]    Culture   Final    [place] Performed at [facility]    Report Status PENDING  Incomplete  Clostridium Difficile by PCR     Status: None   Collection Time: 01/01/15  9:46 AM  Result Value Ref Range Status   C difficile by pcr NEGATIVE NEGATIVE Final     Studies: Ct Abdomen Pelvis W Contrast  12/30/2014   CLINICAL DATA:  Not feeling well for 2 days with fever and chills beginning today. Several procedures recently. Chronic intracranial with endometrial,  ovarian and uterine carcinoma. 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  EXAM: CT ABDOMEN AND PELVIS WITH CONTRAST  TECHNIQUE: Multidetector CT imaging of the abdomen and pelvis was performed using the standard protocol following bolus administration of intravenous contrast.  CONTRAST:  164mL OMNIPAQUE IOHEXOL 300 MG/ML  SOLN  COMPARISON:  12/02/2014  FINDINGS: Lung bases  are within normal.  Abdominal images demonstrate evidence of a previous cholecystectomy. The liver, spleen, pancreas and adrenal glands are normal.  Kidneys are normal size and demonstrate mild bilateral hydronephrosis with the presence of bilateral internal ureteral stents which are in adequate position and unchanged. Delayed images demonstrate contrast in the right intrarenal collecting system but only a scant amount of contrast in the left intrarenal collecting system. Further delayed images demonstrate no contrast in the bladder.  Evidence of patient's colostomy site over the left mid abdomen unchanged. Persistent minimal stranding of the fat adjacent to the ostomy as cannot exclude mild soft tissue infection. No evidence of abscess. Interval resolution of the previous noted subcutaneous air over the abdominal wall. New area of stranding of the subcutaneous fat over the left anterior abdominal wall below the ostomy site which may be due to edema versus soft tissue infection.  Pelvic images demonstrate surgical suture line over the anorectal junction with stable postsurgical changes over the posterior pelvis. A small bowel loop courses into the region of the surgical bed. Bladder is unremarkable. Remainder the exam is unchanged.  IMPRESSION: Stable postsurgical changes over the posterior pelvis likely related to recent excision of recurrent endometrial carcinoma.  Colostomy site over the left mid abdomen with persistent minimal stranding of the fat adjacent to the ostomy as cannot exclude soft tissue infection. No  abscess. Second site of stranding of the subcutaneous fat over the left anterior abdominal wall below the ostomy site which again may represent mild edema versus soft tissue infection.  Bilateral double-J internal ureteral stents in adequate position with persistent mild bilateral hydronephrosis.   Electronically Signed   By: Marin Olp M.D.   On: 12/30/2014 21:48   Dg Abd Acute W/chest  12/30/2014   CLINICAL DATA:  Fever/ chills x3 days, nausea  EXAM: DG ABDOMEN ACUTE W/ 1V CHEST  COMPARISON:  CT abdomen pelvis dated 12/02/2014. Chest radiographs dated 11/20/2014.  FINDINGS: Lungs are clear.  No pleural effusion or pneumothorax.  The heart is normal in size.  Nonobstructive bowel gas pattern. Moderate colonic stool burden. Left mid abdominal colostomy.  No evidence of free air under the diaphragm on the upright view.  Bilateral ureteral stents.  Cholecystectomy clips. Multiple surgical clips overlying the lower abdomen/pelvis.  IMPRESSION: No evidence of acute cardiopulmonary disease.  No evidence of small bowel obstruction or free air.  Moderate colonic stool burden.  Surgical changes, as above.   Electronically Signed   By: Julian Hy M.D.   On: 12/30/2014 19:48    Scheduled Meds: . aztreonam  1 g Intravenous 3 times per day  . canagliflozin  300 mg Oral q morning - 10a  . enoxaparin (LOVENOX) injection  40 mg Subcutaneous QHS  . ferrous sulfate  325 mg Oral BID WC  . insulin aspart  0-9 Units Subcutaneous TID WC  . levothyroxine  175 mcg Oral QAC breakfast  . linagliptin  5 mg Oral Daily  . multivitamin with minerals  1 tablet Oral Daily  . omega-3 acid ethyl esters  1 g Oral BID  . pantoprazole  40 mg Oral Daily  . potassium chloride  40 mEq Oral Once  . rosuvastatin  10 mg Oral QPM  . vancomycin  1,000 mg Intravenous BID   Continuous Infusions:    Principal Problem:   Sepsis Active Problems:   Hypothyroidism   Hypokalemia   UTI (lower urinary tract infection)   Diabetes  mellitus type 2, controlled   Chronic anemia  Urinary tract infectious disease   Katalea Ucci, [facility] Pager [phone]. If 7PM-7AM, please contact night-coverage at [email or web address], password [facility] 01/01/2015, 12:58 PM  LOS: 2 days

## 2015-01-02 ENCOUNTER — Ambulatory Visit (HOSPITAL_COMMUNITY): Payer: BLUE CROSS/BLUE SHIELD

## 2015-01-02 DIAGNOSIS — E876 Hypokalemia: Secondary | ICD-10-CM

## 2015-01-02 DIAGNOSIS — D649 Anemia, unspecified: Secondary | ICD-10-CM

## 2015-01-02 LAB — GLUCOSE, CAPILLARY: Glucose-Capillary: 132 mg/dL — ABNORMAL HIGH (ref 65–99)

## 2015-01-02 LAB — BASIC METABOLIC PANEL
Anion gap: 8 (ref 5–15)
BUN: 18 mg/dL (ref 6–20)
CHLORIDE: 107 mmol/L (ref 101–111)
CO2: 27 mmol/L (ref 22–32)
Calcium: 8.7 mg/dL — ABNORMAL LOW (ref 8.9–10.3)
Creatinine, Ser: 0.63 mg/dL (ref 0.44–1.00)
GFR calc non Af Amer: >60 mL/min (ref >60)
Glucose, Bld: 153 mg/dL — ABNORMAL HIGH (ref 65–99)
Potassium: 3.3 mmol/L — ABNORMAL LOW (ref 3.5–5.1)
Sodium: 142 mmol/L (ref 135–145)

## 2015-01-02 LAB — CBC
HCT: 27.8 % — ABNORMAL LOW (ref 36.0–46.0)
Hemoglobin: 8.3 g/dL — ABNORMAL LOW (ref 12.0–15.0)
MCH: 26.1 pg (ref 26.0–34.0)
MCHC: 29.9 g/dL — ABNORMAL LOW (ref 30.0–36.0)
MCV: 87.4 fL (ref 78.0–100.0)
PLATELETS: 318 10*3/uL (ref 150–400)
RBC: 3.18 MIL/uL — AB (ref 3.87–5.11)
RDW: 15.9 % — ABNORMAL HIGH (ref 11.5–15.5)
WBC: 3.3 10*3/uL — ABNORMAL LOW (ref 4.0–10.5)

## 2015-01-02 MED ORDER — SACCHAROMYCES BOULARDII 250 MG PO CAPS: 250.0000 mg | ORAL_CAPSULE | Freq: Two times a day (BID) | ORAL | Status: DC

## 2015-01-02 MED ORDER — POTASSIUM CHLORIDE CRYS ER 20 MEQ PO TBCR: 20.0000 meq | EXTENDED_RELEASE_TABLET | Freq: Every day | ORAL | Status: DC

## 2015-01-02 MED ORDER — CIPROFLOXACIN HCL 500 MG PO TABS: 500.0000 mg | ORAL_TABLET | Freq: Two times a day (BID) | ORAL | Status: DC

## 2015-01-02 MED ORDER — POTASSIUM CHLORIDE CRYS ER 20 MEQ PO TBCR
40.0000 meq | EXTENDED_RELEASE_TABLET | Freq: Once | ORAL | Status: AC
  Administered 2015-01-02: 40 meq via ORAL
  Filled 2015-01-02: qty 2

## 2015-01-02 MED ORDER — POTASSIUM CHLORIDE 10 MEQ/100ML IV SOLN
10.0000 meq | INTRAVENOUS | Status: DC
  Filled 2015-01-02 (×3): qty 100

## 2015-01-02 MED ORDER — MAGNESIUM CHLORIDE 64 MG PO TBEC: 2.0000 | DELAYED_RELEASE_TABLET | Freq: Two times a day (BID) | ORAL | Status: DC

## 2015-01-02 NOTE — Progress Notes (Signed)
Pt discharged home with husband in stable condition. Scripts sent to pharmacy of choice. Discharge instructions given. Pt verbalized understanding.

## 2015-01-02 NOTE — Discharge Summary (Signed)
Physician Discharge Summary  [name] [id number] DOB: [birth year] DOA: 12/30/2014  PCP: MATTHEWS,MICHELLE A., MD  Admit date: 12/30/2014 Discharge date: 01/02/2015  Recommendations for Outpatient Follow-up:  1. Ciprofloxacin for 10-day course given her indwelling stents and close follow up with urology within 1-2 weeks 2. Repeat BMP and CBC by GYN-ONC or XRT-ONC in 1 week for follow up of anemia, leukopenia and hypokalemia  Discharge Diagnoses:  Principal Problem:   Sepsis Active Problems:   Hypothyroidism   Hypokalemia   UTI (lower urinary tract infection)   Diabetes mellitus type 2, controlled   Chronic anemia   Urinary tract infectious disease   Discharge Condition: stable, improved  Diet recommendation: diabetic  Wt Readings from Last 3 Encounters:  01/01/15 107.502 kg (237 lb)  01/01/15 108.818 kg (239 lb 14.4 oz)  12/25/14 107.729 kg (237 lb 8 oz)    History of present illness/brief summary:  The patient is a 59 year old female with history of recurrent endometrial cancer who is status post exploratory laparotomy, posterior supralevator pelvic exenteration, end colostomy, bladder repair, ureteral stenting with complete resection and negative margins on 11/09/2014. 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 She presented to the emergency department with fevers and chills, fatigue and weakness. She was found to have severe sepsis secondary to urinary tract infection. CT scan demonstrated soft tissue stranding around her colostomy sites both her previous and her new. She was started on empiric antibiotics. Her urine culture grew Enterobacter area sensitive to fluoroquinolones and she is being discharged on ciprofloxacin to complete a 10 day course of antibiotics.  Hospital Course:   Severe sepsis secondary to pyelonephritis from enterobacterial urinary tract infection.  She was started on  vancomycin and aztreonam, however after her blood cultures were no growth to date and her urine culture started growing gram-negative rods her antibiotics were narrowed to monotherapy aztreonam. Her urine culture grew Enterobacteria sensitive to fluoroquinolones and she is being transitioned to ciprofloxacin to complete a ten-day course of antibiotics with close follow-up with urology.  According to radiation oncology, she may benefit from having her stents in place during her course of radiation to prevent ureteral strictures.    Hypokalemia and hypomagnesemia, likely secondary to increased ostomy output, poor by mouth intake, recent treatments for her cancer, and HCTZ use. She was given oral and IV supplementation of magnesium and potassium and her HCTZ was stopped.  She will be started on daily supplements to continue for the next week until she can have repeat BMP and magnesium done by her primary oncologist or radiation oncologist.  Metastatic endometrial cancer followed by oncology. She was seen by radiation oncology Dr. Sondra Come who has recommended IMRT with simulation in the near future.  She will follow up with Dr. Denman [name] who is treating her for her endometrial cancer.    Diabetes mellitus type 2, her blood sugars remained stable on sliding scale insulin. She was advised to resume her previous home regimen.  Essential Hypertension, blood pressure was initially low normal and her blood pressure medications were held.  She may resume her ARB but her HCTZ has been discontinued for now.    Chronic anemia of chronic disease and blood loss from her surgery 2 months ago. Her hemoglobin remained stable at 8.3 mg/dL.  Mild leukopenia likely secondary to chemotherapy, also likely secondary to marrow suppression from severe sepsis. She will need close follow-up with her oncologist  with frequent monitoring now that she is going to be starting radiation treatments.  Hypothyroidism, followed by primary care  doctor and on thyroid replacement which was continued.  Procedures:  CT scan abdomen and pelvis  Consultations:  XRT-ONC, [place]  Antibiotics:  Aztreonam 5/31>>> 6/2   Vancomycin 5/31 >> 6/1  Ciprofloxacin 6/2 >>  Discharge Exam: Filed Vitals:   01/02/15 0423  BP: 143/67  Pulse: 81  Temp: 98.2 F (36.8 C)  Resp: 16   Filed Vitals:   01/01/15 0436 01/01/15 1335 01/01/15 2117 01/02/15 0423  BP: 125/52 123/61 123/60 143/67  Pulse: 83 83  81  Temp: 98.6 F (37 C) 98.4 F (36.9 C) 99.4 F (37.4 C) 98.2 F (36.8 C)  TempSrc: Oral Oral Axillary Oral  Resp: 16 16 16 16   Height:      Weight: 107.502 kg (237 lb)     SpO2: 95% 99% 100% 98%    General: Awake, in nad, sitting at edge of bed  Cardiovascular: regular, s1, s2  Respiratory: CTAB, no wheezing, rales, or rhonchi or increased WOB  Abdomen: soft, obese, nondistended, mild TTP in RLQ without rebound or guarding. Has ostomy with mostly liquid stool in LLQ and bandage over a healing previous ostomy scar which I did not observe today, also in the LLQ  Musculoskeletal: perfused, no clubbing, mild nonpitting edema of hands and feet  Discharge Instructions      Discharge Instructions    Call MD for:  difficulty breathing, headache or visual disturbances    Complete by:  As directed      Call MD for:  extreme fatigue    Complete by:  As directed      Call MD for:  hives    Complete by:  As directed      Call MD for:  persistant dizziness or light-headedness    Complete by:  As directed      Call MD for:  persistant nausea and vomiting    Complete by:  As directed      Call MD for:  redness, tenderness, or signs of infection (pain, swelling, redness, odor or green/yellow discharge around incision site)    Complete by:  As directed      Call MD for:  severe uncontrolled pain    Complete by:  As directed      Call MD for:  temperature >100.4    Complete by:  As directed      Diet Carb Modified    Complete  by:  As directed      Discharge instructions    Complete by:  As directed   Please continue to take ciprofloxacin antibiotic, your next dose is due this evening.  Take all the tabs until gone.  You are at risk of infectious diarrhea because you have been on multiple antibiotics.  Please seek immediate medical attention if you develop watery diarrhea anytime in the next three months.  Please take florastor twice a day for the next three months to reduce the risk of infectious diarrhea.  Please take magnesium and potassium supplements and have your oncologist (or primary care doctor) check your levels in about a week.  Finally, stop your hydrochlorothiazide because this medication in conjunction with your acid reflux medication and chemotherapy may be causing your potassium levels to be very low.  If you develop worsening abdominal pain, vomiting, diarrhea, fevers, chills, or other concerning symptoms, please seek immediate medical assistance or call 911.  Increase activity slowly    Complete by:  As directed             Medication List    STOP taking these medications        acetaminophen 500 MG tablet  Commonly known as:  TYLENOL     enoxaparin 40 MG/0.4ML injection  Commonly known as:  LOVENOX     hydrochlorothiazide 12.5 MG tablet  Commonly known as:  HYDRODIURIL     magnesium oxide 400 (241.3 MG) MG tablet  Commonly known as:  MAG-OX     oxyCODONE 5 MG immediate release tablet  Commonly known as:  Oxy IR/ROXICODONE      TAKE these medications        CALCIUM + D PO  Take 2 tablets by mouth daily.     ciprofloxacin 500 MG tablet  Commonly known as:  CIPRO  Take 1 tablet (500 mg total) by mouth 2 (two) times daily.     ferrous sulfate 325 (65 FE) MG tablet  Take 1 tablet (325 mg total) by mouth 2 (two) times daily with a meal.     ibuprofen 800 MG tablet  Commonly known as:  ADVIL,MOTRIN  Take 800 mg by mouth every 8 (eight) hours as needed for moderate pain (pain).      INVOKANA 300 MG Tabs tablet  Generic drug:  canagliflozin  Take 1 tablet by mouth every morning.     magic mouthwash Soln  Take 5 mLs by mouth daily as needed for mouth pain (mouth pain).     magnesium chloride 64 MG Tbec SR tablet  Commonly known as:  SLOW-MAG  Take 2 tablets (128 mg total) by mouth 2 (two) times daily.     metFORMIN 1000 MG tablet  Commonly known as:  GLUCOPHAGE  Take 1,000 mg by mouth 2 (two) times daily with a meal.     multivitamin with minerals Tabs tablet  Take 1 tablet by mouth daily.     omega-3 acid ethyl esters 1 G capsule  Commonly known as:  LOVAZA  Take 1 g by mouth 2 (two) times daily.     omeprazole 20 MG capsule  Commonly known as:  PRILOSEC  Take 20 mg by mouth at bedtime.     ondansetron 4 MG tablet  Commonly known as:  ZOFRAN  Take 1 tablet (4 mg total) by mouth every 6 (six) hours as needed for nausea.     ONETOUCH VERIO test strip  Generic drug:  glucose blood     potassium chloride SA 20 MEQ tablet  Commonly known as:  K-DUR,KLOR-CON  Take 1 tablet (20 mEq total) by mouth daily.     PROBIOTIC DAILY PO  Take 1 capsule by mouth daily.     rosuvastatin 10 MG tablet  Commonly known as:  CRESTOR  Take 10 mg by mouth every evening.     saccharomyces boulardii 250 MG capsule  Commonly known as:  FLORASTOR  Take 1 capsule (250 mg total) by mouth 2 (two) times daily.     sitaGLIPtin 100 MG tablet  Commonly known as:  JANUVIA  Take 100 mg by mouth daily.     SYNTHROID 175 MCG tablet  Generic drug:  levothyroxine  Take 175 mcg by mouth daily before breakfast.     SYSTANE OP  Apply 1 drop to eye daily as needed (Dry eyes).     Tbo-Filgrastim 480 MCG/0.8ML Sosy injection  Commonly known as:  GRANIX  Inject 480  mcg into the skin once a week.     valsartan 320 MG tablet  Commonly known as:  DIOVAN  Take 320 mg by mouth daily.     Vitamin D3 10000 UNITS capsule  Take 10,000 Units by mouth once a week. Sundays        Follow-up Information    Schedule an appointment as soon as possible for a visit with MATTHEWS,MICHELLE A., MD.   Specialty:  Internal Medicine   Why:  As needed   Contact information:   [place] [address] [phone]       Follow up with Donaciano Eva, MD. Schedule an appointment as soon as possible for a visit in 1 week.   Specialty:  Obstetrics and Gynecology   Contact information:   [place] [address] [phone]       Follow up with Blair Promise, MD. Schedule an appointment as soon as possible for a visit in 2 weeks.   Specialty:  Radiation Oncology   Contact information:   [address] [place] [address] [phone]       Follow up with Alexis Frock, MD. Schedule an appointment as soon as possible for a visit in 2 weeks.   Specialty:  Urology   Contact information:   [place] [address] [phone]        The results of significant diagnostics from this hospitalization (including imaging, microbiology, ancillary and laboratory) are listed below for reference.    Significant Diagnostic Studies: Ct Abdomen Pelvis W Contrast  12/30/2014   CLINICAL DATA:  Not feeling well for 2 days with fever and chills beginning today. Several procedures recently. Chronic intracranial with endometrial, ovarian and uterine carcinoma. 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  EXAM: CT ABDOMEN AND PELVIS WITH CONTRAST  TECHNIQUE: Multidetector CT imaging of the abdomen and pelvis was performed using the standard protocol following bolus administration of intravenous contrast.  CONTRAST:  118mL OMNIPAQUE IOHEXOL 300 MG/ML  SOLN  COMPARISON:  12/02/2014  FINDINGS: Lung bases are within normal.  Abdominal images demonstrate evidence of a previous cholecystectomy. The liver, spleen, pancreas and adrenal glands are normal.  Kidneys are normal size and demonstrate mild bilateral  hydronephrosis with the presence of bilateral internal ureteral stents which are in adequate position and unchanged. Delayed images demonstrate contrast in the right intrarenal collecting system but only a scant amount of contrast in the left intrarenal collecting system. Further delayed images demonstrate no contrast in the bladder.  Evidence of patient's colostomy site over the left mid abdomen unchanged. Persistent minimal stranding of the fat adjacent to the ostomy as cannot exclude mild soft tissue infection. No evidence of abscess. Interval resolution of the previous noted subcutaneous air over the abdominal wall. New area of stranding of the subcutaneous fat over the left anterior abdominal wall below the ostomy site which may be due to edema versus soft tissue infection.  Pelvic images demonstrate surgical suture line over the anorectal junction with stable postsurgical changes over the posterior pelvis. A small bowel loop courses into the region of the surgical bed. Bladder is unremarkable. Remainder the exam is unchanged.  IMPRESSION: Stable postsurgical changes over the posterior pelvis likely related to recent excision of recurrent endometrial carcinoma.  Colostomy site over the left mid abdomen with persistent minimal stranding of the fat adjacent to the ostomy as cannot exclude soft tissue infection. No abscess. Second site  of stranding of the subcutaneous fat over the left anterior abdominal wall below the ostomy site which again may represent mild edema versus soft tissue infection.  Bilateral double-J internal ureteral stents in adequate position with persistent mild bilateral hydronephrosis.   Electronically Signed   By: Marin Olp M.D.   On: 12/30/2014 21:48   Dg Cystogram  12/11/2014   CLINICAL DATA:  59 year old female status post bladder repair and tumor removal 3 weeks ago.  EXAM: CYSTOGRAM  TECHNIQUE: After catheterization of the urinary bladder following sterile technique the bladder  was filled with 200 mL Cysto-Hypaque 30% by drip infusion. Serial spot images were obtained during bladder filling and post draining.  FLUOROSCOPY TIME:  3 minutes and 1 second  COMPARISON:  No priors.  FINDINGS: Initial images of the pelvis demonstrated the distal ends of bilateral double-J ureteral stents, and demonstrated multiple surgical clips. Urinary bladder was normal in appearance, without evidence of extravasation at any point during the examination.  IMPRESSION: 1. Normal cystogram, without evidence of extravasation into the peritoneal or subperitoneal spaces.   Electronically Signed   By: Vinnie Langton M.D.   On: 12/11/2014 12:11   Dg Abd Acute W/chest  12/30/2014   CLINICAL DATA:  Fever/ chills x3 days, nausea  EXAM: DG ABDOMEN ACUTE W/ 1V CHEST  COMPARISON:  CT abdomen pelvis dated 12/02/2014. Chest radiographs dated 11/20/2014.  FINDINGS: Lungs are clear.  No pleural effusion or pneumothorax.  The heart is normal in size.  Nonobstructive bowel gas pattern. Moderate colonic stool burden. Left mid abdominal colostomy.  No evidence of free air under the diaphragm on the upright view.  Bilateral ureteral stents.  Cholecystectomy clips. Multiple surgical clips overlying the lower abdomen/pelvis.  IMPRESSION: No evidence of acute cardiopulmonary disease.  No evidence of small bowel obstruction or free air.  Moderate colonic stool burden.  Surgical changes, as above.   Electronically Signed   By: Julian Hy M.D.   On: 12/30/2014 19:48   Dg Abd Portable 2v  12/07/2014   CLINICAL DATA:  Nausea and vomiting.  Abdominal pain today.  EXAM: PORTABLE ABDOMEN - 2 VIEW  COMPARISON:  12/02/2014.  FINDINGS: Bilateral double-J ureteral stents are present with distal loops reconstituted in the urinary bladder. Partial reconstitution of the LEFT renal pelvis loop. Surgical clips compatible with pelvic lymphadenectomy. Tubing projects over the abdomen. There is no gross plain film evidence of free air.  Cholecystectomy clips are present in the right upper quadrant. Large stool burden. Soft tissue emphysema is present along the LEFT side of the abdomen, likely residual gas from prior CT 12/02/2014.  IMPRESSION: 1. 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 2. No gross plain film evidence of free air. 3. Large stool burden. 4. Double-J ureteral stents and postsurgical changes in the abdomen and pelvis.   Electronically Signed   By: Dereck Ligas M.D.   On: 12/07/2014 19:20    Microbiology: Recent Results (from the past 240 hour(s))  Culture, blood (routine x 2)     Status: None (Preliminary result)   Collection Time: 12/30/14  6:46 PM  Result Value Ref Range Status   Specimen Description BLOOD RAC  Final   Special Requests BOTTLES DRAWN AEROBIC AND ANAEROBIC 5CC  Final   Culture   Final           BLOOD CULTURE RECEIVED NO GROWTH TO DATE CULTURE WILL BE HELD FOR 5 DAYS BEFORE ISSUING A FINAL NEGATIVE REPORT Performed at [facility]  Report Status PENDING  Incomplete  Culture, blood (routine x 2)     Status: None (Preliminary result)   Collection Time: 12/30/14  7:00 PM  Result Value Ref Range Status   Specimen Description BLOOD LAC  Final   Special Requests BOTTLES DRAWN AEROBIC AND ANAEROBIC 5CC  Final   Culture   Final           BLOOD CULTURE RECEIVED NO GROWTH TO DATE CULTURE WILL BE HELD FOR 5 DAYS BEFORE ISSUING A FINAL NEGATIVE REPORT Performed at [facility]    Report Status PENDING  Incomplete  Urine culture     Status: None   Collection Time: 12/30/14  7:19 PM  Result Value Ref Range Status   Specimen Description URINE, CLEAN CATCH  Final   Special Requests NONE  Final   Colony Count   Final    >=100,000 COLONIES/ML Performed at [facility]    Culture   Final    ENTEROBACTER CLOACAE Performed at [facility]    Report Status 01/01/2015 FINAL  Final   Organism ID, Bacteria ENTEROBACTER CLOACAE  Final       Susceptibility   Enterobacter cloacae - MIC*    CEFAZOLIN >=64 RESISTANT Resistant     CEFTRIAXONE <=1 SENSITIVE Sensitive     CIPROFLOXACIN <=0.25 SENSITIVE Sensitive     GENTAMICIN <=1 SENSITIVE Sensitive     LEVOFLOXACIN 0.5 SENSITIVE Sensitive     NITROFURANTOIN 64 INTERMEDIATE Intermediate     TOBRAMYCIN <=1 SENSITIVE Sensitive     TRIMETH/SULFA <=20 SENSITIVE Sensitive     PIP/TAZO 16 SENSITIVE Sensitive     * ENTEROBACTER CLOACAE  Clostridium Difficile by PCR     Status: None   Collection Time: 01/01/15  9:46 AM  Result Value Ref Range Status   C difficile by pcr NEGATIVE NEGATIVE Final     Labs: Basic Metabolic Panel:  Recent Labs Lab 12/30/14 1913 12/31/14 0004 12/31/14 0638 01/01/15 0345 01/01/15 1212 01/02/15 0400  NA 137  --  135 140  --  142  K 2.8*  --  2.8* 2.7* 3.5 3.3*  CL 98*  --  99* 102  --  107  CO2 27  --  25 26  --  27  GLUCOSE 214*  --  156* 142*  --  153*  BUN 21*  --  15 16  --  18  CREATININE 0.82 0.86 0.76 0.76  --  0.63  CALCIUM 8.9  --  7.9* 8.3*  --  8.7*  MG  --  1.2*  --   --   --   --    Liver Function Tests:  Recent Labs Lab 12/30/14 1913 12/31/14 0638  AST 15 11*  ALT 16 12*  ALKPHOS 69 57  BILITOT 0.4 0.6  PROT 7.5 6.7  ALBUMIN 3.2* 2.8*   No results for input(s): LIPASE, AMYLASE in the last 168 hours. No results for input(s): AMMONIA in the last 168 hours. CBC:  Recent Labs Lab 12/27/14 1523 12/30/14 1913 12/31/14 0004 12/31/14 0638 01/01/15 0345 01/02/15 0400  WBC 3.7* 13.3* 9.4 8.6 4.1 3.3*  NEUTROABS 1.4* 11.2*  --  6.1  --   --   HGB 10.4* 9.7* 8.9* 8.7* 8.3* 8.3*  HCT 33.7* 31.9* 29.5* 28.0* 27.0* 27.8*  MCV 86.6 84.8 85.0 85.9 86.5 87.4  PLT 358 347 294 283 277 318   Cardiac Enzymes: No results for input(s): CKTOTAL, CKMB, CKMBINDEX, TROPONINI in the last 168 hours.  BNP: BNP (last 3 results) No results for input(s): BNP in the last 8760 hours.  ProBNP (last 3 results) No results for input(s):  PROBNP in the last 8760 hours.  CBG:  Recent Labs Lab 01/01/15 0816 01/01/15 1211 01/01/15 1822 01/01/15 2115 01/02/15 0736  GLUCAP 188* 133* 130* 179* 132*    Time coordinating discharge: 35 minutes  Signed:  Amaani Guilbault  [facility] 01/02/2015, 9:46 AM

## 2015-01-03 ENCOUNTER — Ambulatory Visit (HOSPITAL_BASED_OUTPATIENT_CLINIC_OR_DEPARTMENT_OTHER): Payer: BLUE CROSS/BLUE SHIELD | Admitting: Hematology and Oncology

## 2015-01-03 ENCOUNTER — Ambulatory Visit (HOSPITAL_BASED_OUTPATIENT_CLINIC_OR_DEPARTMENT_OTHER): Payer: BLUE CROSS/BLUE SHIELD

## 2015-01-03 ENCOUNTER — Other Ambulatory Visit (HOSPITAL_BASED_OUTPATIENT_CLINIC_OR_DEPARTMENT_OTHER): Payer: BLUE CROSS/BLUE SHIELD

## 2015-01-03 ENCOUNTER — Encounter: Payer: Self-pay | Admitting: Hematology and Oncology

## 2015-01-03 ENCOUNTER — Telehealth: Payer: Self-pay | Admitting: Hematology and Oncology

## 2015-01-03 ENCOUNTER — Ambulatory Visit: Payer: BLUE CROSS/BLUE SHIELD | Admitting: Gynecologic Oncology

## 2015-01-03 VITALS — BP 126/52 | HR 71 | Temp 98.4°F | Resp 19 | Ht 62.0 in | Wt 238.8 lb

## 2015-01-03 DIAGNOSIS — C549 Malignant neoplasm of corpus uteri, unspecified: Secondary | ICD-10-CM | POA: Diagnosis not present

## 2015-01-03 DIAGNOSIS — D709 Neutropenia, unspecified: Secondary | ICD-10-CM

## 2015-01-03 DIAGNOSIS — D630 Anemia in neoplastic disease: Secondary | ICD-10-CM | POA: Diagnosis not present

## 2015-01-03 DIAGNOSIS — N390 Urinary tract infection, site not specified: Secondary | ICD-10-CM | POA: Diagnosis not present

## 2015-01-03 DIAGNOSIS — Z8543 Personal history of malignant neoplasm of ovary: Secondary | ICD-10-CM

## 2015-01-03 LAB — CBC & DIFF AND RETIC
BASO%: 1 % (ref 0.0–2.0)
BASOS ABS: 0 10*3/uL (ref 0.0–0.1)
EOS ABS: 0.2 10*3/uL (ref 0.0–0.5)
EOS%: 6.8 % (ref 0.0–7.0)
HEMATOCRIT: 29.3 % — AB (ref 34.8–46.6)
HGB: 9 g/dL — ABNORMAL LOW (ref 11.6–15.9)
Immature Retic Fract: 15.7 % — ABNORMAL HIGH (ref 1.60–10.00)
LYMPH%: 38.1 % (ref 14.0–49.7)
MCH: 26.5 pg (ref 25.1–34.0)
MCHC: 30.7 g/dL — ABNORMAL LOW (ref 31.5–36.0)
MCV: 86.2 fL (ref 79.5–101.0)
MONO#: 0.5 10*3/uL (ref 0.1–0.9)
MONO%: 17.3 % — ABNORMAL HIGH (ref 0.0–14.0)
NEUT%: 36.8 % — AB (ref 38.4–76.8)
NEUTROS ABS: 1.1 10*3/uL — AB (ref 1.5–6.5)
PLATELETS: 317 10*3/uL (ref 145–400)
RBC: 3.4 10*6/uL — AB (ref 3.70–5.45)
RDW: 15.9 % — AB (ref 11.2–14.5)
RETIC %: 1.46 % (ref 0.70–2.10)
RETIC CT ABS: 49.64 10*3/uL (ref 33.70–90.70)
WBC: 2.9 10*3/uL — AB (ref 3.9–10.3)
lymph#: 1.1 10*3/uL (ref 0.9–3.3)

## 2015-01-03 MED ORDER — TBO-FILGRASTIM 480 MCG/0.8ML ~~LOC~~ SOSY
480.0000 ug | PREFILLED_SYRINGE | SUBCUTANEOUS | Status: DC
  Administered 2015-01-03: 480 ug via SUBCUTANEOUS
  Filled 2015-01-03: qty 0.8

## 2015-01-03 NOTE — Assessment & Plan Note (Signed)
She had recent history of recurrent urinary tract infection. She is currently on oral antibody therapy. As above, we will support her with growth factor injections.

## 2015-01-03 NOTE — Progress Notes (Signed)
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 Going to the patient, initial diagnosis with ovarian cancer was when she presented with abdomen and no swelling and pelvic pain leading to her urgent care visit. She states that the tumor had caused torsion of the ovary. She cannot remember the stage of her disease. She began to develop leukopenia in approximately July of 2010. She underwent an initial bone marrow biopsy at [facility] in [place] on 03/17/2009. but it was nondiagnostic. No gross dysplastic changes, no excess blasts, normal cytogenetics.  Her counts have been stable over observation through this office since December 2010 with some minor fluctuations. Total white counts run as low as 1800 and as high as 2900. Percent neutrophils fluctuates very widely from as low as 4% recorded on a CBC done 02/03/2010 to as high as 31% with average being 20%. She was hospitalized in the ICU at that time  Both hemoglobin and platelets have remained stable over time.  Despite leukopenia, she has not had any recurrent or severe infections. She stated strong family history of lupus in a cousin. She complained of occasional skin rash and joint stiffness. 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 She received G-CSF in January of 2015 to stimulate white blood cell production around a colonoscopy procedure a few years ago. She had a limited response. On 01/23/2014, repeat bone marrow biopsy excluded lymphoma or leukemia. On 09/12/2014, CT scan of the abdomen and pelvis show possible relapse of gynecological Malignancy On 10/14/2014, MRI of the  pelvis show significant pelvic mass without invasion into the bladder but abutting to the rectum On 11/19/2014, she underwent surgery and had robotic-assisted lysis of adhesions, converted to laparotomy, radical upper vaginectomy and low anterior resection with colostomy. Bilateral ureteral stent placement and cystotomy repair. 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 Resection margins were negative. She was hospitalized from 11/19/2014 to 11/25/2014. In May, showed recurrent admission to the hospital due to complication with infection.  INTERVAL HISTORY: [name] 59 y.o. female returns for further follow-up. She was just discharged home for recent recurrent urinary tract infection. The wound is healing well. Denies further fevers or chills. She is managing her colostomy well. The patient denies any recent signs or symptoms of bleeding such as spontaneous epistaxis, hematuria or hematochezia.   I have reviewed the past medical history, past surgical history, social history and family history with the patient and they are unchanged from previous note.  ALLERGIES:  is allergic to penicillins; ultram; adhesive; cefaclor; erythromycin; trimethoprim; and sulfa antibiotics.  MEDICATIONS:  Current Outpatient Prescriptions  Medication Sig Dispense Refill  . Alum & Mag Hydroxide-Simeth (MAGIC MOUTHWASH) SOLN Take 5 mLs by mouth daily as needed for mouth pain (mouth pain).    . Calcium Carbonate-Vitamin D (CALCIUM + D PO) Take 2 tablets by mouth daily.    . Canagliflozin (INVOKANA) 300 MG TABS Take 1 tablet by mouth every morning.     . Cholecalciferol (VITAMIN D3) 10000 UNITS capsule Take 10,000 Units by mouth once a week. Sundays    . ciprofloxacin (CIPRO) 500 MG tablet Take 1 tablet (500 mg total) by mouth 2 (two) times  daily. 16 tablet 0  . ferrous sulfate 325 (65 FE) MG tablet Take 1  tablet (325 mg total) by mouth 2 (two) times daily with a meal. 60 tablet 3  . ibuprofen (ADVIL,MOTRIN) 800 MG tablet Take 800 mg by mouth every 8 (eight) hours as needed for moderate pain (pain).    [name] Kitchen levothyroxine (SYNTHROID) 175 MCG tablet Take 175 mcg by mouth daily before breakfast.    . magnesium chloride (SLOW-MAG) 64 MG TBEC SR tablet Take 2 tablets (128 mg total) by mouth 2 (two) times daily. 120 tablet 0  . metFORMIN (GLUCOPHAGE) 1000 MG tablet Take 1,000 mg by mouth 2 (two) times daily with a meal.     . Multiple Vitamin (MULTIVITAMIN WITH MINERALS) TABS tablet Take 1 tablet by mouth daily.    [name] Kitchen omega-3 acid ethyl esters (LOVAZA) 1 G capsule Take 1 g by mouth 2 (two) times daily.    [name] Kitchen omeprazole (PRILOSEC) 20 MG capsule Take 20 mg by mouth at bedtime.     Glory Rosebush VERIO test strip     . Polyethyl Glycol-Propyl Glycol (SYSTANE OP) Apply 1 drop to eye daily as needed (Dry eyes).     . potassium chloride SA (K-DUR,KLOR-CON) 20 MEQ tablet Take 1 tablet (20 mEq total) by mouth daily. 30 tablet 0  . Probiotic Product (PROBIOTIC DAILY PO) Take 1 capsule by mouth daily.    . rosuvastatin (CRESTOR) 10 MG tablet Take 10 mg by mouth every evening.     . saccharomyces boulardii (FLORASTOR) 250 MG capsule Take 1 capsule (250 mg total) by mouth 2 (two) times daily. 60 capsule 2  . sitaGLIPtin (JANUVIA) 100 MG tablet Take 100 mg by mouth daily.    . Tbo-Filgrastim (GRANIX) 480 MCG/0.8ML SOSY injection Inject 480 mcg into the skin once a week. _0     . valsartan (DIOVAN) 320 MG tablet Take 320 mg by mouth daily.    . ondansetron (ZOFRAN) 4 MG tablet Take 1 tablet (4 mg total) by mouth every 6 (six) hours as needed for nausea. (Patient not taking: Reported on 01/01/2015) 20 tablet 0  . oxyCODONE (OXY IR/ROXICODONE) 5 MG immediate release tablet   0   No current facility-administered medications for this visit.   Facility-Administered Medications Ordered in Other Visits  Medication Dose Route  Frequency Provider Last Rate Last Dose  . Tbo-Filgrastim (GRANIX) injection 480 mcg  480 mcg Subcutaneous Weekly Heath Lark, MD   480 mcg at 11/29/14 1338     REVIEW OF SYSTEMS:   Constitutional: Denies fevers, chills or night sweats Eyes: Denies blurriness of vision Ears, nose, mouth, throat, and face: Denies mucositis or sore throat Respiratory: Denies cough, dyspnea or wheezes Cardiovascular: Denies palpitation, chest discomfort or lower extremity swelling Gastrointestinal:  Denies nausea, heartburn or change in bowel habits Skin: Denies abnormal skin rashes Lymphatics: Denies new lymphadenopathy or easy bruising Neurological:Denies numbness, tingling or new weaknesses Behavioral/Psych: Mood is stable, no new changes  All other systems were reviewed with the patient and are negative.  PHYSICAL EXAMINATION: ECOG PERFORMANCE STATUS: 1 - Symptomatic but completely ambulatory  Filed Vitals:   01/03/15 1318  BP: 126/52  Pulse: 71  Temp: 98.4 F (36.9 C)  Resp: 19   Filed Weights   01/03/15 1318  Weight: 238 lb 12.8 oz (108.319 kg)    GENERAL:alert, no distress and comfortable. She is obese and pale SKIN: skin color is pale, texture, turgor are normal, no rashes or significant lesions EYES: normal, Conjunctiva are  pink and non-injected, sclera clear ABDOMEN:abdomen soft, non-tender and normal bowel sounds. Colostomy appeared to be working well. Well-healed surgical scars. Musculoskeletal:no cyanosis of digits and no clubbing  NEURO: alert & oriented x 3 with fluent speech, no focal motor/sensory deficits  LABORATORY DATA:  I have reviewed the data as listed Results for orders placed or performed in visit on 01/03/15 (from the past 48 hour(s))  CBC & Diff and Retic     Status: Abnormal   Collection Time: 01/03/15  1:05 PM  Result Value Ref Range   WBC 2.9 (L) 3.9 - 10.3 10e3/uL   NEUT# 1.1 (L) 1.5 - 6.5 10e3/uL   HGB 9.0 (L) 11.6 - 15.9 g/dL   HCT 29.3 (L) 34.8 - 46.6 %    Platelets 317 145 - 400 10e3/uL   MCV 86.2 79.5 - 101.0 fL   MCH 26.5 25.1 - 34.0 pg   MCHC 30.7 (L) 31.5 - 36.0 g/dL   RBC 3.40 (L) 3.70 - 5.45 10e6/uL   RDW 15.9 (H) 11.2 - 14.5 %   lymph# 1.1 0.9 - 3.3 10e3/uL   MONO# 0.5 0.1 - 0.9 10e3/uL   Eosinophils Absolute 0.2 0.0 - 0.5 10e3/uL   Basophils Absolute 0.0 0.0 - 0.1 10e3/uL   NEUT% 36.8 (L) 38.4 - 76.8 %   LYMPH% 38.1 14.0 - 49.7 %   MONO% 17.3 (H) 0.0 - 14.0 %   EOS% 6.8 0.0 - 7.0 %   BASO% 1.0 0.0 - 2.0 %   Retic % 1.46 0.70 - 2.10 %   Retic Ct Abs 49.64 33.70 - 90.70 10e3/uL   Immature Retic Fract 15.70 (H) 1.60 - 10.00 %    Lab Results  Component Value Date   WBC 2.9* 01/03/2015   HGB 9.0* 01/03/2015   HCT 29.3* 01/03/2015   MCV 86.2 01/03/2015   PLT 317 01/03/2015  ASSESSMENT & PLAN:  Cancer of corpus uteri, except isthmus I will defer to her surgical oncologist and radiation oncologist for further management. The plan of care would be to proceed with adjuvant radiation therapy in the near future. I do not believe systemic chemotherapy is a good option for her due to recurrent infection episodes and chronic neutropenia. However, in the future, if systemic treatment is necessary, we'll support her with growth factor injections.   Chronic neutropenia The patient responded well to G-CSF. There is no role for IVIG. I recommend she treat returns on a weekly basis for G-CSF injection to keep her [place] greater than 1500.    UTI (lower urinary tract infection) She had recent history of recurrent urinary tract infection. She is currently on oral antibody therapy. As above, we will support her with growth factor injections.   Anemia in neoplastic disease This is likely anemia of chronic disease. The patient denies recent history of bleeding such as epistaxis, hematuria or hematochezia. She is asymptomatic from the anemia. We will observe for now.  She does not require transfusion now.      All questions were  answered. The patient knows to call the clinic with any problems, questions or concerns. No barriers to learning was detected.  I spent 25 minutes counseling the patient face to face. The total time spent in the appointment was 30 minutes and more than 50% was on counseling.     [facility], Ashutosh Dieguez, MD 6/3/20161:46 PM

## 2015-01-03 NOTE — Assessment & Plan Note (Signed)
I will defer to her surgical oncologist and radiation oncologist for further management. The plan of care would be to proceed with adjuvant radiation therapy in the near future. I do not believe systemic chemotherapy is a good option for her due to recurrent infection episodes and chronic neutropenia. However, in the future, if systemic treatment is necessary, we'll support her with growth factor injections.

## 2015-01-03 NOTE — Telephone Encounter (Signed)
Gave adn pr;inted appt sched and avs

## 2015-01-03 NOTE — Assessment & Plan Note (Signed)
The patient responded well to G-CSF. There is no role for IVIG. I recommend she treat returns on a weekly basis for G-CSF injection to keep her [place] greater than 1500.

## 2015-01-03 NOTE — Assessment & Plan Note (Signed)
This is likely anemia of chronic disease. The patient denies recent history of bleeding such as epistaxis, hematuria or hematochezia. She is asymptomatic from the anemia. We will observe for now.  She does not require transfusion now.   

## 2015-01-06 ENCOUNTER — Ambulatory Visit (INDEPENDENT_AMBULATORY_CARE_PROVIDER_SITE_OTHER): Payer: BLUE CROSS/BLUE SHIELD | Admitting: Internal Medicine

## 2015-01-06 ENCOUNTER — Encounter: Payer: Self-pay | Admitting: Internal Medicine

## 2015-01-06 VITALS — BP 117/58 | HR 71 | Temp 97.8°F | Resp 16 | Ht 62.0 in | Wt 235.0 lb

## 2015-01-06 DIAGNOSIS — I9589 Other hypotension: Secondary | ICD-10-CM | POA: Diagnosis not present

## 2015-01-06 DIAGNOSIS — E876 Hypokalemia: Secondary | ICD-10-CM

## 2015-01-06 LAB — BASIC METABOLIC PANEL
BUN: 17 mg/dL (ref 6–23)
CALCIUM: 9.6 mg/dL (ref 8.4–10.5)
CO2: 24 mEq/L (ref 19–32)
Chloride: 105 mEq/L (ref 96–112)
Creat: 0.8 mg/dL (ref 0.50–1.10)
Glucose, Bld: 126 mg/dL — ABNORMAL HIGH (ref 70–99)
POTASSIUM: 4 meq/L (ref 3.5–5.3)
SODIUM: 141 meq/L (ref 135–145)

## 2015-01-06 LAB — CULTURE, BLOOD (ROUTINE X 2)
Culture: NO GROWTH
Culture: NO GROWTH

## 2015-01-09 ENCOUNTER — Ambulatory Visit (HOSPITAL_BASED_OUTPATIENT_CLINIC_OR_DEPARTMENT_OTHER): Payer: BLUE CROSS/BLUE SHIELD

## 2015-01-09 ENCOUNTER — Other Ambulatory Visit (HOSPITAL_BASED_OUTPATIENT_CLINIC_OR_DEPARTMENT_OTHER): Payer: BLUE CROSS/BLUE SHIELD

## 2015-01-09 VITALS — BP 125/51 | HR 72 | Temp 97.6°F

## 2015-01-09 DIAGNOSIS — D709 Neutropenia, unspecified: Secondary | ICD-10-CM | POA: Diagnosis not present

## 2015-01-09 DIAGNOSIS — C549 Malignant neoplasm of corpus uteri, unspecified: Secondary | ICD-10-CM

## 2015-01-09 LAB — CBC & DIFF AND RETIC
BASO%: 2.4 % — ABNORMAL HIGH (ref 0.0–2.0)
BASOS ABS: 0.1 10*3/uL (ref 0.0–0.1)
EOS ABS: 0.3 10*3/uL (ref 0.0–0.5)
EOS%: 7.6 % — ABNORMAL HIGH (ref 0.0–7.0)
HCT: 34.8 % (ref 34.8–46.6)
HGB: 10.6 g/dL — ABNORMAL LOW (ref 11.6–15.9)
Immature Retic Fract: 13.9 % — ABNORMAL HIGH (ref 1.60–10.00)
LYMPH%: 43.6 % (ref 14.0–49.7)
MCH: 26.1 pg (ref 25.1–34.0)
MCHC: 30.5 g/dL — ABNORMAL LOW (ref 31.5–36.0)
MCV: 85.7 fL (ref 79.5–101.0)
MONO#: 0.7 10*3/uL (ref 0.1–0.9)
MONO%: 19.7 % — ABNORMAL HIGH (ref 0.0–14.0)
NEUT#: 0.9 10*3/uL — ABNORMAL LOW (ref 1.5–6.5)
NEUT%: 26.7 % — ABNORMAL LOW (ref 38.4–76.8)
NRBC: 0 % (ref 0–0)
Platelets: 417 10*3/uL — ABNORMAL HIGH (ref 145–400)
RBC: 4.06 10*6/uL (ref 3.70–5.45)
RDW: 16.2 % — ABNORMAL HIGH (ref 11.2–14.5)
Retic %: 2.97 % — ABNORMAL HIGH (ref 0.70–2.10)
Retic Ct Abs: 120.58 10*3/uL — ABNORMAL HIGH (ref 33.70–90.70)
WBC: 3.3 10*3/uL — ABNORMAL LOW (ref 3.9–10.3)
lymph#: 1.4 10*3/uL (ref 0.9–3.3)

## 2015-01-09 MED ORDER — TBO-FILGRASTIM 480 MCG/0.8ML ~~LOC~~ SOSY
480.0000 ug | PREFILLED_SYRINGE | SUBCUTANEOUS | Status: DC
  Administered 2015-01-09: 480 ug via SUBCUTANEOUS
  Filled 2015-01-09: qty 0.8

## 2015-01-10 ENCOUNTER — Ambulatory Visit: Payer: BLUE CROSS/BLUE SHIELD | Attending: Gynecologic Oncology | Admitting: Gynecologic Oncology

## 2015-01-10 ENCOUNTER — Encounter: Payer: Self-pay | Admitting: Gynecologic Oncology

## 2015-01-10 ENCOUNTER — Other Ambulatory Visit: Payer: Self-pay | Admitting: Hematology and Oncology

## 2015-01-10 ENCOUNTER — Other Ambulatory Visit: Payer: Self-pay | Admitting: Lab

## 2015-01-10 ENCOUNTER — Other Ambulatory Visit (HOSPITAL_BASED_OUTPATIENT_CLINIC_OR_DEPARTMENT_OTHER): Payer: BLUE CROSS/BLUE SHIELD

## 2015-01-10 VITALS — BP 126/60 | HR 82 | Temp 97.6°F | Resp 18 | Ht 62.0 in | Wt 234.0 lb

## 2015-01-10 DIAGNOSIS — Z9221 Personal history of antineoplastic chemotherapy: Secondary | ICD-10-CM | POA: Insufficient documentation

## 2015-01-10 DIAGNOSIS — C541 Malignant neoplasm of endometrium: Secondary | ICD-10-CM | POA: Diagnosis not present

## 2015-01-10 DIAGNOSIS — Z90722 Acquired absence of ovaries, bilateral: Secondary | ICD-10-CM | POA: Diagnosis not present

## 2015-01-10 DIAGNOSIS — N390 Urinary tract infection, site not specified: Secondary | ICD-10-CM | POA: Diagnosis not present

## 2015-01-10 DIAGNOSIS — Z483 Aftercare following surgery for neoplasm: Secondary | ICD-10-CM | POA: Insufficient documentation

## 2015-01-10 DIAGNOSIS — D709 Neutropenia, unspecified: Secondary | ICD-10-CM | POA: Diagnosis not present

## 2015-01-10 DIAGNOSIS — R300 Dysuria: Secondary | ICD-10-CM

## 2015-01-10 DIAGNOSIS — E119 Type 2 diabetes mellitus without complications: Secondary | ICD-10-CM | POA: Insufficient documentation

## 2015-01-10 DIAGNOSIS — Z960 Presence of urogenital implants: Secondary | ICD-10-CM | POA: Diagnosis not present

## 2015-01-10 DIAGNOSIS — Z90710 Acquired absence of both cervix and uterus: Secondary | ICD-10-CM | POA: Insufficient documentation

## 2015-01-10 DIAGNOSIS — Z8542 Personal history of malignant neoplasm of other parts of uterus: Secondary | ICD-10-CM | POA: Insufficient documentation

## 2015-01-10 DIAGNOSIS — B9689 Other specified bacterial agents as the cause of diseases classified elsewhere: Secondary | ICD-10-CM | POA: Diagnosis not present

## 2015-01-10 DIAGNOSIS — C569 Malignant neoplasm of unspecified ovary: Secondary | ICD-10-CM | POA: Diagnosis not present

## 2015-01-10 DIAGNOSIS — Z933 Colostomy status: Secondary | ICD-10-CM | POA: Diagnosis not present

## 2015-01-10 DIAGNOSIS — Z9049 Acquired absence of other specified parts of digestive tract: Secondary | ICD-10-CM | POA: Insufficient documentation

## 2015-01-10 DIAGNOSIS — IMO0002 Reserved for concepts with insufficient information to code with codable children: Secondary | ICD-10-CM

## 2015-01-10 LAB — URINALYSIS, MICROSCOPIC - CHCC
BILIRUBIN (URINE): NEGATIVE
Glucose: 2000 mg/dL
KETONES: NEGATIVE mg/dL
Nitrite: NEGATIVE
PROTEIN: 300 mg/dL
SPECIFIC GRAVITY, URINE: 1.015 (ref 1.003–1.035)
Urobilinogen, UR: 0.2 mg/dL (ref 0.2–1)
pH: 5 (ref 4.6–8.0)

## 2015-01-10 NOTE — Patient Instructions (Signed)
Proceed with Dr. Sondra Come for radiation. Plan to see Dr. Denman [name] once radiation treatments are completed. Please call us with any questions or concerns.

## 2015-01-10 NOTE — Progress Notes (Signed)
Endometrial cancer FOLLOWUP VISIT  Assessment:    59 y.o. year old with recurrent endometrioid endometrial/ovarian cancer.   S/p exploratory laparotomy, posterior supralevator pelvic exenteration, end colostomy, bladder repair, ureteral stenting with complete resection and negative margins on 11/09/14. 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 S/p hospital admission 12/20/14 for urosepsis   Plan: 1) Recurrent endometrial/ovarian cancer: She has negative margins on her specimen. We are recommending adjuvant radiation to consolidate pelvic control and reduce risks for local recurrence. 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 She has seen Dr Sondra Come and treatment planning began (IMRT).  She is ready to start radiation from the standpoint of postoperative healing.  2) Enterobacter UTI in the setting of bilateral ureteral stents on 12/30/14 requiring hospitalization s/p outpatient abx course complete yesterday: will send urine analysis and test of cure culture today. Would consider nitrofurantoin 127m q day prophylactic dosing if she has persistent bacteruria in the setting of ureteral stents and chronic neutropenia.  3) neutropenia - Dr GAlvy Bimleris managing this and we appreciate.  4) peristomal cellulitis-resolved. Wound vac removed from original stomal site and now undergoing more simple dressing.  5)  Complex bladder repair- patient voiding more normally. Mild hydro on CT imaging is likely secondary to ureteral stents in situ. Given the very close proximity of the UO's to the site of the cytotomy repair, I am recommending continuing with the ureteral stents until closer to 12 weeks postop. Will obtain CT urogram  in the 1st week of July and outpatient cystoscopic stent removal in the 2nd week of July if the scan shows no progressive hydronephrosis. We will then repeat imaging 2-4 weeks post stent removal to evaluate for progressive hydro which might be indicative of the development of ureteral stricturing.  I discussed with PImagene Gurneythat radiation can also cause ureteral stricture formation.  The patient had an appointment made with Dr MTresa Moorefrom [facility] as part of her discharge planning from her hospital admission. Dr MTresa Mooreprovided intraoperative consultation at the time of her initial surgery regarding stent placement and bladder repair. I appreciate his input and recommendations.   6) Follow-up: I will see PImagene Gurneyfor followup prior to stents removal.  HPI:  PNikiyah Fackleris a 59y.o. year old referred by Dr GAlvy Bimlerfor recurrent endometrioid endometrial/ovarian cancer (central pelvic recurrence) in the setting of chronic idiopathic neutropenia.  She has a history of endometrial and ovarian endometrioid carcinoma [facility] [place] (TAH, BSO) was followed by adjuvant chemotherapy with carboplatin plus paclitaxel due to the ovarian involvement and the presence of a cul de sac lesion also positive for disease. She denies receiving adjuvant radiation therapy. She is unclear if she had metastatic endometrial cancer to the ovary or duel primaries. She had a complete response to therapy however developed leukopenia in July 2010. 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 She sees Dr. GAlvy Bimlerfor this and is treated with G-CSF injections.  She began experiencing rectal pain approximately in January 2016. She also reports narrowing of caliber of the stool. She denies hematochezia. She does report approximately 3 months of vaginal  spotting. She's had  no specific follow-up for her gynecologic cancers in the past 4 years.  As part of workup of her rectal pain she underwent a CT scan of the abdomen and pelvis on 09/12/2014. This demonstrated a new right perirectal mass abutting the vaginal cuff measuring 3.8 x 4.9 cm. There is a limited fat plane between the mass and the rectum posteriorly. Rectal invasion could not be excluded. There were no other masses identified in the abdomen and pelvis or lymphadenopathy. There is no other evidence of metastatic disease or recurrent disease. There was no hydronephrosis. A CA-125 drawn on 09/12/2014 was normal at 10.  PET was negative for extrapelvic disease. MRI defined the lesion as a 3.5x5cm lesion to the right of the rectum at the vaginal cuff.  Colonoscopy was performed on 10/02/14 with transrectal [place] and biopsy and this revealed endometrioid adenocarcinoma. Of note, the lesion was not seen within the lumen of the rectum.   She then underwent a posterior supralevator exenteration with colostomy and bladder repair and stent placement on 12/18/82 without complications.  Her postoperative course was uncomplicate with the exception of development of postop anemia.  Her final pathology revealed endometrioid adenocarcinoma invading the vagina and rectum with negative margins on the specimen. The margin had been close (clinically) to the right pelvic sidewall which was marked with surgical clips.  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 It was treated with IV antibiotics and then on POD 15 she was taken to the OR for laparoscopic revision of the stoma with Dr Michael [name]. Postoperatively she had wound vac and packing for her stomal wound.  A retrograde cystogram on week 4 postop confirmed an intact bladder and the foley was removed.  She initially had some voiding issues with decreased sensation to void. We tested a post  void residual in May, 2016 and this revealed adequate voiding.  On 12/30/14 she was admitted to [facility] with sepsis associated with Enterobacter Cloacae UTI. This was treated with IV antibiotics and then a prolonged course of oral cipro. Imaging performed at the time of admission (a CT of the abdo/pelvis) revealed: Bilateral double-J internal ureteral stents in adequate position with persistent mild bilateral hydronephrosis   Interval Hx: since discharge from the hospitals she has been doing well with no major complaints. She does report normal voiding and no pain.  Review of systems: Constitutional:  She has no weight gain or weight loss. She has a low grade fever no chills. Eyes: No blurred vision Ears, Nose, Mouth, Throat: No dizziness, headaches or changes in hearing. No mouth sores. Cardiovascular: No chest pain, palpitations or edema. Respiratory:  No shortness of breath, wheezing or cough Gastrointestinal: She has normal bowel movements trhough stoma without diarrhea or constipation. She denies any nausea or vomiting. She denies blood in her stool or heart burn. Genitourinary:  She denies pelvic pain, pelvic pressure or changes in her urinary function. She has no hematuria, dysuria, or incontinence. She has no irregular vaginal bleeding or vaginal discharge Musculoskeletal: Denies muscle weakness or joint pains.  Skin:  She has no skin changes, rashes or itching Neurological:  Denies dizziness or headaches. No neuropathy, no numbness or tingling. Psychiatric:  She denies depression or anxiety. Hematologic/Lymphatic:   No easy bruising or bleeding  Allergies  Allergen Reactions  . Penicillins Swelling    Facial swelling  . Ultram [Tramadol] Hives  . Adhesive [Tape]     blisters  .  Cefaclor Rash    Ceclor  . Erythromycin     Gastritis, abd cramps  . Trimethoprim Rash  . Sulfa Antibiotics Rash    Current Outpatient Prescriptions on File Prior to Visit  Medication Sig  Dispense Refill  . Canagliflozin (INVOKANA) 300 MG TABS Take 1 tablet by mouth every morning.     . Cholecalciferol (VITAMIN D3) 10000 UNITS capsule Take 10,000 Units by mouth once a week. Sundays    . ferrous sulfate 325 (65 FE) MG tablet Take 1 tablet (325 mg total) by mouth 2 (two) times daily with a meal. 60 tablet 3  . ibuprofen (ADVIL,MOTRIN) 800 MG tablet Take 800 mg by mouth every 8 (eight) hours as needed for moderate pain (pain).    [name] Kitchen levothyroxine (SYNTHROID) 175 MCG tablet Take 175 mcg by mouth daily before breakfast.    . magnesium chloride (SLOW-MAG) 64 MG TBEC SR tablet Take 2 tablets (128 mg total) by mouth 2 (two) times daily. 120 tablet 0  . metFORMIN (GLUCOPHAGE) 1000 MG tablet Take 1,000 mg by mouth 2 (two) times daily with a meal.     . Multiple Vitamin (MULTIVITAMIN WITH MINERALS) TABS tablet Take 1 tablet by mouth daily.    [name] Kitchen omega-3 acid ethyl esters (LOVAZA) 1 G capsule Take 1 g by mouth 2 (two) times daily.    [name] Kitchen omeprazole (PRILOSEC) 20 MG capsule Take 20 mg by mouth at bedtime.     Glory Rosebush VERIO test strip     . Polyethyl Glycol-Propyl Glycol (SYSTANE OP) Apply 1 drop to eye daily as needed (Dry eyes).     . potassium chloride SA (K-DUR,KLOR-CON) 20 MEQ tablet Take 1 tablet (20 mEq total) by mouth daily. 30 tablet 0  . rosuvastatin (CRESTOR) 10 MG tablet Take 10 mg by mouth every evening.     . saccharomyces boulardii (FLORASTOR) 250 MG capsule Take 1 capsule (250 mg total) by mouth 2 (two) times daily. 60 capsule 2  . sitaGLIPtin (JANUVIA) 100 MG tablet Take 100 mg by mouth daily.    . Tbo-Filgrastim (GRANIX) 480 MCG/0.8ML SOSY injection Inject 480 mcg into the skin once a week. _0     . valsartan (DIOVAN) 320 MG tablet Take 320 mg by mouth daily.    . Alum & Mag Hydroxide-Simeth (MAGIC MOUTHWASH) SOLN Take 5 mLs by mouth daily as needed for mouth pain (mouth pain).    . Calcium Carbonate-Vitamin D (CALCIUM + D PO) Take 2 tablets by mouth daily.    . ondansetron  (ZOFRAN) 4 MG tablet Take 1 tablet (4 mg total) by mouth every 6 (six) hours as needed for nausea. (Patient not taking: Reported on 01/01/2015) 20 tablet 0  . oxyCODONE (OXY IR/ROXICODONE) 5 MG immediate release tablet   0   Current Facility-Administered Medications on File Prior to Visit  Medication Dose Route Frequency Provider Last Rate Last Dose  . Tbo-Filgrastim (GRANIX) injection 480 mcg  480 mcg Subcutaneous Weekly Heath Lark, MD   480 mcg at 11/29/14 1338    Past Medical History  Diagnosis Date  . Hypothyroidism 12/14/2011  . Benign essential HTN 12/14/2011  . Neutropenia   . Postmenopausal bleeding 09/05/2014  . Anemia     Planned Neupogen injections for chronic neutropenia  . Transfusion history     '06-s/p hysterectomy and staging  . Multiple thyroid nodules y-8    Managed by Dr. Harlow Asa  . GERD (gastroesophageal reflux disease)   . Polyp of duodenum y-2  . DM type  2 (diabetes mellitus, type 2) 12/14/2011  . Dysrhythmia     skipped beat with occasional coffee   . Hx of bronchitis   . Depression   . Hiatal hernia y-12    hx of small hiatal hernia   . Neutropenia     chronic   . Cancer     '06-Ovarian/ Uterine Cancer-chemotherapy  . 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 Appendectomy    . Tonsillectomy    . Cholecystectomy      laparoscopic  . Eye surgery      pytosis of eyelids-child  . Colonoscopy with propofol N/A 08/21/2013    Procedure: COLONOSCOPY WITH PROPOFOL;  Surgeon: Cleotis Nipper, MD;  Location: [facility];  Service: Endoscopy;  Laterality: N/A;  . Abdominal hysterectomy  2006    staging for Ovarian cancer  . Eus N/A 10/02/2014    Procedure: LOWER ENDOSCOPIC ULTRASOUND (EUS);  Surgeon: Arta Silence, MD;  Location: [facility];  Service: Endoscopy;  Laterality: N/A;  . Robotic assisted lap vaginal hysterectomy N/A 11/19/2014    Procedure: ROBOTIC LYSIS OF ADHESIONS, CONVERTED TO LAPAROTOMY  RADICAL UPPER VAGINECTOMY,LOW ANTERIOR BOWEL RESECTION, COLOSTOMY, BILATERAL URETERAL STENT PLACEMENT AND CYSTONOMY CLOSURE;  Surgeon: Everitt Amber, MD;  Location: [facility];  Service: Gynecology;  Laterality: N/A;  . Ostomy N/A 11/19/2014    Procedure: OSTOMY;  Surgeon: Michael [name], MD;  Location: [facility];  Service: General;  Laterality: N/A;  . Colostomy takedown N/A 12/04/2014    Procedure: LAPROSCOPIC LYSIS OF ADHESIONS, SPLENIC MOBILIZATION, RELOCATION OF COLOSTOMY, DEBRIDEMENT INITIAL COLOSTOMY SITE;  Surgeon: Michael [name], MD;  Location: [facility];  Service: General;  Laterality: N/A;    Family History  Problem Relation Age of Onset  . Cancer Mother     stomach ca  . Hypertension Mother   . Cancer Father     prostate ca  . Diabetes Father   . Diabetes Sister   . Hypertension Brother y-10    History   Social History  . Marital Status: Married    Spouse Name: N/A  . Number of Children: 1  . Years of Education: N/A   Occupational History  . retired Therapist, sports from [name] History Main Topics  . Smoking status: Never Smoker   . Smokeless tobacco: Never Used  . Alcohol Use: Yes     Comment: rare social  . Drug Use: No  . Sexual Activity: Not Currently   Other Topics Concern  . Not on file   Social History Narrative   CBC    Component Value Date/Time   WBC 3.3* 01/09/2015 1253   WBC 3.3* 01/02/2015 0400   WBC 2.4* 08/25/2014 1541   RBC 4.06 01/09/2015 1253   RBC 3.18* 01/02/2015 0400   RBC 3.68* 12/13/2014 1235   RBC 4.75 08/25/2014 1541   HGB 10.6* 01/09/2015 1253   HGB 8.3* 01/02/2015 0400   HGB 13.0 08/25/2014 1541   HCT 34.8 01/09/2015 1253   HCT 27.8* 01/02/2015 0400   HCT 41.3 08/25/2014 1541   PLT 417* 01/09/2015 1253   PLT 318 01/02/2015 0400   MCV 85.7 01/09/2015 1253   MCV 87.4 01/02/2015 0400   MCV 86.8 08/25/2014 1541   MCH 26.1 01/09/2015 1253   MCH 26.1 01/02/2015 0400   MCH 27.5 08/25/2014 1541   MCHC 30.5* 01/09/2015 1253   MCHC 29.9*  01/02/2015 0400   MCHC 31.6* 08/25/2014 1541  RDW 16.2* 01/09/2015 1253   RDW 15.9* 01/02/2015 0400   LYMPHSABS 1.4 01/09/2015 1253   LYMPHSABS 1.1 12/31/2014 0638   MONOABS 0.7 01/09/2015 1253   MONOABS 1.4* 12/31/2014 0638   EOSABS 0.3 01/09/2015 1253   EOSABS 0.0 12/31/2014 0638   BASOSABS 0.1 01/09/2015 1253   BASOSABS 0.0 12/31/2014 9747     Physical Exam: Blood pressure 126/60, pulse 82, temperature 97.6 F (36.4 C), temperature source Oral, resp. rate 18, height _0  (1.575 m), weight 234 lb (106.142 kg). General: Well dressed, well nourished in no apparent distress.   HEENT:  Normocephalic and atraumatic, no lesions.  Extraocular muscles intact. Sclerae anicteric. Pupils equal, round, reactive. No mouth sores or ulcers. Thyroid is normal size, not nodular, midline. Skin:  No lesions or rashes. Breasts:  deferred Lungs:  Clear to auscultation bilaterally.  No wheezes. Cardiovascular:  Regular rate and rhythm.  No murmurs or rubs. Abdomen:  Soft, nontender, nondistended.  No palpable masses.  No hepatosplenomegaly.  No ascites. Normal bowel sounds.  No hernias.  Incision is healed. Laparoscopic incision healed. Stomal site (original) packed. Stomal appliance in situ. Genitourinary: vaginal cuff intact without blood or fluid in the vault. Rectal exam unremarkable. Extremities: No cyanosis, clubbing or edema.  No calf tenderness or erythema. No palpable cords. Psychiatric: Mood and affect are appropriate. Neurological: Awake, alert and oriented x 3. Sensation is intact, no neuropathy.  Musculoskeletal: No pain, normal strength and range of motion.  Donaciano Eva, MD

## 2015-01-11 LAB — URINE CULTURE

## 2015-01-13 ENCOUNTER — Ambulatory Visit: Payer: BLUE CROSS/BLUE SHIELD | Admitting: Gynecologic Oncology

## 2015-01-14 ENCOUNTER — Other Ambulatory Visit: Payer: BLUE CROSS/BLUE SHIELD

## 2015-01-16 ENCOUNTER — Ambulatory Visit (HOSPITAL_BASED_OUTPATIENT_CLINIC_OR_DEPARTMENT_OTHER): Payer: BLUE CROSS/BLUE SHIELD

## 2015-01-16 ENCOUNTER — Other Ambulatory Visit (HOSPITAL_BASED_OUTPATIENT_CLINIC_OR_DEPARTMENT_OTHER): Payer: BLUE CROSS/BLUE SHIELD

## 2015-01-16 VITALS — BP 133/73 | HR 76 | Temp 98.4°F

## 2015-01-16 DIAGNOSIS — C549 Malignant neoplasm of corpus uteri, unspecified: Secondary | ICD-10-CM

## 2015-01-16 DIAGNOSIS — D709 Neutropenia, unspecified: Secondary | ICD-10-CM | POA: Diagnosis not present

## 2015-01-16 LAB — CBC & DIFF AND RETIC
BASO%: 1.4 % (ref 0.0–2.0)
BASOS ABS: 0 10*3/uL (ref 0.0–0.1)
EOS%: 6.4 % (ref 0.0–7.0)
Eosinophils Absolute: 0.2 10*3/uL (ref 0.0–0.5)
HEMATOCRIT: 34.4 % — AB (ref 34.8–46.6)
HEMOGLOBIN: 10.7 g/dL — AB (ref 11.6–15.9)
IMMATURE RETIC FRACT: 6.2 % (ref 1.60–10.00)
LYMPH#: 1.4 10*3/uL (ref 0.9–3.3)
LYMPH%: 48.1 % (ref 14.0–49.7)
MCH: 26.4 pg (ref 25.1–34.0)
MCHC: 31.1 g/dL — ABNORMAL LOW (ref 31.5–36.0)
MCV: 84.9 fL (ref 79.5–101.0)
MONO#: 0.6 10*3/uL (ref 0.1–0.9)
MONO%: 18.6 % — ABNORMAL HIGH (ref 0.0–14.0)
NEUT#: 0.8 10*3/uL — ABNORMAL LOW (ref 1.5–6.5)
NEUT%: 25.5 % — AB (ref 38.4–76.8)
PLATELETS: 398 10*3/uL (ref 145–400)
RBC: 4.05 10*6/uL (ref 3.70–5.45)
RDW: 15.9 % — ABNORMAL HIGH (ref 11.2–14.5)
RETIC CT ABS: 45.36 10*3/uL (ref 33.70–90.70)
Retic %: 1.12 % (ref 0.70–2.10)
WBC: 3 10*3/uL — ABNORMAL LOW (ref 3.9–10.3)
nRBC: 0 % (ref 0–0)

## 2015-01-16 MED ORDER — TBO-FILGRASTIM 480 MCG/0.8ML ~~LOC~~ SOSY
480.0000 ug | PREFILLED_SYRINGE | SUBCUTANEOUS | Status: DC
  Administered 2015-01-16: 480 ug via SUBCUTANEOUS
  Filled 2015-01-16: qty 0.8

## 2015-01-17 ENCOUNTER — Telehealth: Payer: Self-pay | Admitting: Oncology

## 2015-01-17 NOTE — Telephone Encounter (Signed)
Left a message for [name] reminding her to not take metformin on Monday morning before her CT Simulation.

## 2015-01-20 ENCOUNTER — Ambulatory Visit
Admission: RE | Admit: 2015-01-20 | Discharge: 2015-01-20 | Disposition: A | Discharge status: Home / Self Care | Payer: BLUE CROSS/BLUE SHIELD | Source: Ambulatory Visit | Attending: Radiation Oncology | Admitting: Radiation Oncology

## 2015-01-20 ENCOUNTER — Encounter: Payer: Self-pay | Admitting: Internal Medicine

## 2015-01-20 VITALS — BP 140/65 | HR 64 | Temp 97.7°F | Resp 12 | Ht 62.0 in | Wt 238.0 lb

## 2015-01-20 DIAGNOSIS — Z90710 Acquired absence of both cervix and uterus: Secondary | ICD-10-CM | POA: Diagnosis not present

## 2015-01-20 DIAGNOSIS — C541 Malignant neoplasm of endometrium: Secondary | ICD-10-CM | POA: Diagnosis present

## 2015-01-20 DIAGNOSIS — Z9049 Acquired absence of other specified parts of digestive tract: Secondary | ICD-10-CM | POA: Diagnosis not present

## 2015-01-20 DIAGNOSIS — I10 Essential (primary) hypertension: Secondary | ICD-10-CM | POA: Diagnosis not present

## 2015-01-20 DIAGNOSIS — E119 Type 2 diabetes mellitus without complications: Secondary | ICD-10-CM | POA: Diagnosis not present

## 2015-01-20 DIAGNOSIS — F329 Major depressive disorder, single episode, unspecified: Secondary | ICD-10-CM | POA: Diagnosis not present

## 2015-01-20 DIAGNOSIS — Z510 Encounter for antineoplastic radiation therapy: Secondary | ICD-10-CM | POA: Diagnosis not present

## 2015-01-20 DIAGNOSIS — E039 Hypothyroidism, unspecified: Secondary | ICD-10-CM | POA: Diagnosis not present

## 2015-01-20 DIAGNOSIS — R1900 Intra-abdominal and pelvic swelling, mass and lump, unspecified site: Secondary | ICD-10-CM

## 2015-01-20 DIAGNOSIS — C549 Malignant neoplasm of corpus uteri, unspecified: Secondary | ICD-10-CM

## 2015-01-20 DIAGNOSIS — Z794 Long term (current) use of insulin: Secondary | ICD-10-CM | POA: Diagnosis not present

## 2015-01-20 DIAGNOSIS — K219 Gastro-esophageal reflux disease without esophagitis: Secondary | ICD-10-CM | POA: Diagnosis not present

## 2015-01-20 MED ORDER — SODIUM CHLORIDE 0.9 % IJ SOLN
10.0000 mL | Freq: Once | INTRAMUSCULAR | Status: AC
  Administered 2015-01-20: 10 mL via INTRAVENOUS

## 2015-01-20 NOTE — Progress Notes (Signed)
Patient here for IV start for CT sim.  She denies having an allergy to IV contrast dye.  She is diabetic and takes metformin.  She has not taken it since yesterday (01/19/15) am.  Her BUN was 17 and cr was 0.80 from 01/06/15 BMP.  #22 gauge IV started in right ac.  Blood return noted.  Flushed with 10 cc normal saline.  Secured with tegaderm.  Patient advised to hold metformin until BUN/CR is drawn on 01/22/15 at 3 pm.  She will be called with the results and will be notified if she can restart metformin.  Rechelle verbalized agreement and understanding.  Melissa, RT in [place] Sim notified that patient is ready for simulation.

## 2015-01-20 NOTE — Progress Notes (Signed)
IV removed intact by Gaspar Garbe, RN.  Patient also reminded about lab appointment on Wednesday.

## 2015-01-20 NOTE — Progress Notes (Signed)
Patient ID: [name], female   DOB: Nov 30, 1955, 59 y.o.   MRN: [id number]   [name], is a 59 y.o. female  [id number]  [id number]  DOB - [birth year]  CC:  Chief Complaint  Patient presents with  . Follow-up    hospital follow up        HPI: [name] is a 59 y.o. female here today to follow up after recent hospitalization for Urosepsis secondary to pyelonephritis. She was also noted to have hypokalemia and her HCTZ was discontinued at time of hospital discharge as was the Magnesium Oxide.   Pt is scheduled to follow up with Dr. Denman [name] and Dr. Pollyann Savoy in the next week. Patient has No headache, No chest pain, No abdominal pain - No Nausea, No new weakness tingling or numbness, No Cough - SOB.  Allergies  Allergen Reactions  . Penicillins Swelling    Facial swelling  . Ultram [Tramadol] Hives  . Adhesive [Tape]     blisters  . Cefaclor Rash    Ceclor  . Erythromycin     Gastritis, abd cramps  . Trimethoprim Rash  . Sulfa Antibiotics Rash   Past Medical History  Diagnosis Date  . Hypothyroidism 12/14/2011  . Benign essential HTN 12/14/2011  . Neutropenia   . Postmenopausal bleeding 09/05/2014  . Anemia     Planned Neupogen injections for chronic neutropenia  . Transfusion history     '06-s/p hysterectomy and staging  . Multiple thyroid nodules y-8    Managed by Dr. Harlow Asa  . GERD (gastroesophageal reflux disease)   . Polyp of duodenum y-2  . DM type 2 (diabetes mellitus, type 2) 12/14/2011  . Dysrhythmia     skipped beat with occasional coffee   . Hx of bronchitis   . Depression   . Hiatal hernia y-12    hx of small hiatal hernia   . Neutropenia     chronic   . Cancer     '06-Ovarian/ Uterine Cancer-chemotherapy  . 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 Alum & Mag Hydroxide-Simeth (MAGIC MOUTHWASH) SOLN Take 5 mLs by mouth daily as needed for mouth pain  (mouth pain).    . Calcium Carbonate-Vitamin D (CALCIUM + D PO) Take 2 tablets by mouth daily.    . Canagliflozin (INVOKANA) 300 MG TABS Take 1 tablet by mouth every morning.     . Cholecalciferol (VITAMIN D3) 10000 UNITS capsule Take 10,000 Units by mouth once a week. Sundays    . ferrous sulfate 325 (65 FE) MG tablet Take 1 tablet (325 mg total) by mouth 2 (two) times daily with a meal. 60 tablet 3  . ibuprofen (ADVIL,MOTRIN) 800 MG tablet Take 800 mg by mouth every 8 (eight) hours as needed for moderate pain (pain).    [name] Kitchen levothyroxine (SYNTHROID) 175 MCG tablet Take 175 mcg by mouth daily before breakfast.    . magnesium chloride (SLOW-MAG) 64 MG TBEC SR tablet Take 2 tablets (128 mg total) by mouth 2 (two) times daily. 120 tablet 0  . metFORMIN (GLUCOPHAGE) 1000 MG tablet Take 1,000 mg by mouth 2 (two) times daily with a meal.     . Multiple Vitamin (MULTIVITAMIN WITH MINERALS) TABS tablet Take 1 tablet by mouth daily.    [name] Kitchen omega-3 acid ethyl esters (LOVAZA) 1 G capsule Take 1 g by mouth 2 (two) times daily.    [name] Kitchen omeprazole (PRILOSEC) 20 MG capsule  Take 20 mg by mouth at bedtime.     Glory Rosebush VERIO test strip     . oxyCODONE (OXY IR/ROXICODONE) 5 MG immediate release tablet   0  . Polyethyl Glycol-Propyl Glycol (SYSTANE OP) Apply 1 drop to eye daily as needed (Dry eyes).     . potassium chloride SA (K-DUR,KLOR-CON) 20 MEQ tablet Take 1 tablet (20 mEq total) by mouth daily. 30 tablet 0  . rosuvastatin (CRESTOR) 10 MG tablet Take 10 mg by mouth every evening.     . saccharomyces boulardii (FLORASTOR) 250 MG capsule Take 1 capsule (250 mg total) by mouth 2 (two) times daily. 60 capsule 2  . sitaGLIPtin (JANUVIA) 100 MG tablet Take 100 mg by mouth daily.    . Tbo-Filgrastim (GRANIX) 480 MCG/0.8ML SOSY injection Inject 480 mcg into the skin once a week. @[facility]     . valsartan (DIOVAN) 320 MG tablet Take 320 mg by mouth daily.    . ondansetron (ZOFRAN) 4 MG tablet Take 1 tablet (4 mg total) by  mouth every 6 (six) hours as needed for nausea. (Patient not taking: Reported on 01/01/2015) 20 tablet 0   Current Facility-Administered Medications on File Prior to Visit  Medication Dose Route Frequency Provider Last Rate Last Dose  . Tbo-Filgrastim (GRANIX) injection 480 mcg  480 mcg Subcutaneous Weekly Heath Lark, MD   480 mcg at 11/29/14 1338   Family History  Problem Relation Age of Onset  . Cancer Mother     stomach ca  . Hypertension Mother   . Cancer Father     prostate ca  . Diabetes Father   . Diabetes Sister   . Hypertension Brother y-10   History   Social History  . Marital Status: Married    Spouse Name: N/A  . Number of Children: 1  . Years of Education: N/A   Occupational History  . retired Therapist, sports from [place] History Main Topics  . Smoking status: Never Smoker   . Smokeless tobacco: Never Used  . Alcohol Use: Yes     Comment: rare social  . Drug Use: No  . Sexual Activity: Not Currently   Other Topics Concern  . Not on file   Social History Narrative    Review of Systems: Constitutional: Negative for fever, chills, diaphoresis, activity change, appetite change and fatigue. HENT: Negative for ear pain, nosebleeds, congestion, facial swelling, rhinorrhea, neck pain, neck stiffness and ear discharge.  Eyes: Negative for pain, discharge, redness, itching and visual disturbance. Respiratory: Negative for cough, choking, chest tightness, shortness of breath, wheezing and stridor.  Cardiovascular: Negative for chest pain, palpitations and leg swelling. Gastrointestinal: Negative for abdominal distention. Genitourinary: Negative for dysuria, urgency, frequency, hematuria, flank pain, decreased urine volume, difficulty urinating and dyspareunia.  Musculoskeletal: Negative for back pain, joint swelling, arthralgia and gait problem. Neurological: Negative for dizziness, tremors, seizures, syncope, facial asymmetry, speech difficulty, weakness,  light-headedness, numbness and headaches.  Hematological: Negative for adenopathy. Does not bruise/bleed easily. Psychiatric/Behavioral: Negative for hallucinations, behavioral problems, confusion, dysphoric mood, decreased concentration and agitation.     Objective:   Filed Vitals:   01/06/15 1520  BP: 117/58  Pulse: 71  Temp: 97.8 F (36.6 C)  Resp: 16    Physical Exam: Constitutional: Patient appears well-developed and well-nourished. No distress. Actually well-appearing today. HENT: Normocephalic, atraumatic, External right and left ear normal. Oropharynx is clear and moist.  Eyes: Conjunctivae and EOM are normal. PERRLA, no scleral icterus. Neck: Normal ROM. Neck  supple. No JVD. No tracheal deviation. No thyromegaly. CVS: RRR, S1/S2 +, no murmurs, no gallops, no carotid bruit.  Pulmonary: Effort and breath sounds normal, no stridor, rhonchi, wheezes, rales.  Abdominal: Soft. BS +, no distension, tenderness, rebound or guarding.  Musculoskeletal: Normal range of motion. No edema and no tenderness.  Lymphadenopathy: No lymphadenopathy noted, cervical, inguinal or axillary Neuro: Alert. Normal reflexes, muscle tone coordination. No cranial nerve deficit. Skin: Skin is warm and dry. No rash noted. Not diaphoretic. No erythema. No pallor. Psychiatric: Normal mood and affect. Behavior, judgment, thought content normal.   Lab Results  Component Value Date   WBC 3.0* 01/16/2015   HGB 10.7* 01/16/2015   HCT 34.4* 01/16/2015   MCV 84.9 01/16/2015   PLT 398 01/16/2015   Lab Results  Component Value Date   CREATININE 0.80 01/06/2015   BUN 17 01/06/2015   NA 141 01/06/2015   K 4.0 01/06/2015   CL 105 01/06/2015   CO2 24 01/06/2015    Lab Results  Component Value Date   HGBA1C 6.8* 11/21/2014   Lipid Panel     Component Value Date/Time   CHOL  05/09/2009 0916    75        ATP III CLASSIFICATION:  <200     mg/dL   Desirable  200-239  mg/dL   Borderline High  >=240     mg/dL   High          TRIG 60 11/19/2014 1946   HDL 39* 05/09/2009 0916   CHOLHDL 1.9 05/09/2009 0916   VLDL 9 05/09/2009 0916   LDLCALC  05/09/2009 0916    27        Total Cholesterol/HDL:CHD Risk Coronary Heart Disease Risk Table                     Men   Women  1/2 Average Risk   3.4   3.3  Average Risk       5.0   4.4  2 X Average Risk   9.6   7.1  3 X Average Risk  23.4   11.0        Use the calculated Patient Ratio above and the CHD Risk Table to determine the patient's CHD Risk.        ATP III CLASSIFICATION (LDL):  <100     mg/dL   Optimal  100-129  mg/dL   Near or Above                    Optimal  130-159  mg/dL   Borderline  160-189  mg/dL   High  >190     mg/dL   Very High       Assessment and plan:   1. Hypokalemia - BP adequate today. She has no symptoms of orthostatic changes despite her lower BP. Will check metabolic panel both for electrolytes and for renal function. - Basic Metabolic Panel  2. Secondary hypotension - Although BP low today as compared to previous BP, she has no signs of orthostatic changes.  - Will continue to hold HCTZ. Pt has an appointment scheduled in August and her BP will be re-assessed at that time.  OF note DM II and Hypothyroidism followed by Dr. Elyse Hsu.   Return for HTN at appointment scheduled in August.  The patient was given clear instructions to go to ER or return to medical center if symptoms don't improve, worsen or new problems develop. The patient  verbalized understanding. The patient was told to call to get lab results if they haven't heard anything in the next week.     This note has been created with Surveyor, quantity. Any transcriptional errors are unintentional.    Kamica Florance A., MD [place], [place]   01/20/2015, 12:01 PM

## 2015-01-21 NOTE — Progress Notes (Signed)
  Radiation Oncology         [phone] ________________________________  Name: [name] MRN: [id number]  Date: 01/20/2015  DOB: [birth year]  SIMULATION AND TREATMENT PLANNING NOTE    ICD-9-CM ICD-10-CM   1. 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  Identity was confirmed.  All relevant records and images related to the planned course of therapy were reviewed.  The patient freely provided informed written consent to proceed with treatment after reviewing the details related to the planned course of therapy. The consent form was witnessed and verified by the simulation staff.  Then, the patient was set-up in a stable reproducible  supine position for radiation therapy.  CT images were obtained.  Surface markings were placed.  The CT images were loaded into the planning software.  Then the target and avoidance structures were contoured.  Treatment planning then occurred.  The radiation prescription was entered and confirmed.  Then, I designed and supervised the construction of a total of 1 medically necessary complex treatment devices.  I have requested : Intensity Modulated Radiotherapy (IMRT) is medically necessary for this case for the following reason:  Small bowel and bone marrow sparing..  I have ordered:dose calc.  PLAN:  The patient will receive 50.4 Gy in 28 fractions.  ________________________________  -----------------------------------  Blair Promise, PhD, MD

## 2015-01-22 ENCOUNTER — Ambulatory Visit (HOSPITAL_BASED_OUTPATIENT_CLINIC_OR_DEPARTMENT_OTHER): Payer: BLUE CROSS/BLUE SHIELD

## 2015-01-22 ENCOUNTER — Telehealth: Payer: Self-pay | Admitting: Oncology

## 2015-01-22 ENCOUNTER — Ambulatory Visit (HOSPITAL_BASED_OUTPATIENT_CLINIC_OR_DEPARTMENT_OTHER)
Admission: RE | Admit: 2015-01-22 | Discharge: 2015-01-22 | Disposition: A | Discharge status: Home / Self Care | Payer: BLUE CROSS/BLUE SHIELD | Source: Ambulatory Visit | Attending: Radiation Oncology | Admitting: Radiation Oncology

## 2015-01-22 ENCOUNTER — Other Ambulatory Visit: Payer: Self-pay | Admitting: *Deleted

## 2015-01-22 VITALS — BP 119/46 | HR 67 | Temp 98.5°F

## 2015-01-22 DIAGNOSIS — Z510 Encounter for antineoplastic radiation therapy: Secondary | ICD-10-CM | POA: Diagnosis not present

## 2015-01-22 DIAGNOSIS — C549 Malignant neoplasm of corpus uteri, unspecified: Secondary | ICD-10-CM | POA: Diagnosis not present

## 2015-01-22 DIAGNOSIS — D709 Neutropenia, unspecified: Secondary | ICD-10-CM

## 2015-01-22 DIAGNOSIS — D630 Anemia in neoplastic disease: Secondary | ICD-10-CM | POA: Diagnosis not present

## 2015-01-22 LAB — CBC & DIFF AND RETIC
BASO%: 1.7 % (ref 0.0–2.0)
BASOS ABS: 0 10*3/uL (ref 0.0–0.1)
EOS ABS: 0.2 10*3/uL (ref 0.0–0.5)
EOS%: 6.8 % (ref 0.0–7.0)
HCT: 33.8 % — ABNORMAL LOW (ref 34.8–46.6)
HEMOGLOBIN: 10.4 g/dL — AB (ref 11.6–15.9)
Immature Retic Fract: 17.2 % — ABNORMAL HIGH (ref 1.60–10.00)
LYMPH#: 1.2 10*3/uL (ref 0.9–3.3)
LYMPH%: 50 % — AB (ref 14.0–49.7)
MCH: 26.4 pg (ref 25.1–34.0)
MCHC: 30.8 g/dL — ABNORMAL LOW (ref 31.5–36.0)
MCV: 85.8 fL (ref 79.5–101.0)
MONO#: 0.7 10*3/uL (ref 0.1–0.9)
MONO%: 30.3 % — AB (ref 0.0–14.0)
NEUT%: 11.2 % — ABNORMAL LOW (ref 38.4–76.8)
NEUTROS ABS: 0.3 10*3/uL — AB (ref 1.5–6.5)
NRBC: 0 % (ref 0–0)
Platelets: 318 10*3/uL (ref 145–400)
RBC: 3.94 10*6/uL (ref 3.70–5.45)
RDW: 16.2 % — ABNORMAL HIGH (ref 11.2–14.5)
Retic %: 2.07 % (ref 0.70–2.10)
Retic Ct Abs: 81.56 10*3/uL (ref 33.70–90.70)
WBC: 2.3 10*3/uL — ABNORMAL LOW (ref 3.9–10.3)

## 2015-01-22 LAB — BUN AND CREATININE (CC13)
BUN: 14.1 mg/dL (ref 7.0–26.0)
CREATININE: 0.8 mg/dL (ref 0.6–1.1)
EGFR: 81 mL/min/{1.73_m2} — AB (ref >90)

## 2015-01-22 MED ORDER — TBO-FILGRASTIM 480 MCG/0.8ML ~~LOC~~ SOSY
480.0000 ug | PREFILLED_SYRINGE | SUBCUTANEOUS | Status: DC
  Administered 2015-01-22: 480 ug via SUBCUTANEOUS
  Filled 2015-01-22: qty 0.8

## 2015-01-22 NOTE — Telephone Encounter (Signed)
Called Dunbar and let her know that she can restart her metformin tonight. Her BUN was 14.1 and CR was 0.8.  Namya verbalized agreement and understanding.

## 2015-01-23 ENCOUNTER — Ambulatory Visit: Payer: BLUE CROSS/BLUE SHIELD

## 2015-01-23 ENCOUNTER — Ambulatory Visit (HOSPITAL_COMMUNITY): Payer: BLUE CROSS/BLUE SHIELD

## 2015-01-23 ENCOUNTER — Other Ambulatory Visit: Payer: BLUE CROSS/BLUE SHIELD

## 2015-01-27 ENCOUNTER — Encounter (HOSPITAL_COMMUNITY): Payer: Self-pay

## 2015-01-27 ENCOUNTER — Ambulatory Visit (HOSPITAL_COMMUNITY)
Admission: RE | Admit: 2015-01-27 | Discharge: 2015-01-27 | Disposition: A | Discharge status: Home / Self Care | Payer: BLUE CROSS/BLUE SHIELD | Source: Ambulatory Visit | Attending: Gynecologic Oncology | Admitting: Gynecologic Oncology

## 2015-01-27 ENCOUNTER — Other Ambulatory Visit: Payer: Self-pay | Admitting: Gynecologic Oncology

## 2015-01-27 DIAGNOSIS — C541 Malignant neoplasm of endometrium: Secondary | ICD-10-CM | POA: Diagnosis present

## 2015-01-27 DIAGNOSIS — IMO0002 Reserved for concepts with insufficient information to code with codable children: Secondary | ICD-10-CM

## 2015-01-27 MED ORDER — IOHEXOL 300 MG/ML  SOLN
100.0000 mL | Freq: Once | INTRAMUSCULAR | Status: AC | PRN
  Administered 2015-01-27: 100 mL via INTRAVENOUS

## 2015-01-29 ENCOUNTER — Ambulatory Visit
Admission: RE | Admit: 2015-01-29 | Discharge: 2015-01-29 | Disposition: A | Discharge status: Home / Self Care | Payer: BLUE CROSS/BLUE SHIELD | Source: Ambulatory Visit | Attending: Radiation Oncology | Admitting: Radiation Oncology

## 2015-01-29 DIAGNOSIS — Z510 Encounter for antineoplastic radiation therapy: Secondary | ICD-10-CM | POA: Diagnosis not present

## 2015-01-30 ENCOUNTER — Ambulatory Visit
Admission: RE | Admit: 2015-01-30 | Discharge: 2015-01-30 | Disposition: A | Discharge status: Home / Self Care | Payer: BLUE CROSS/BLUE SHIELD | Source: Ambulatory Visit | Attending: Radiation Oncology | Admitting: Radiation Oncology

## 2015-01-30 ENCOUNTER — Ambulatory Visit (HOSPITAL_BASED_OUTPATIENT_CLINIC_OR_DEPARTMENT_OTHER): Payer: BLUE CROSS/BLUE SHIELD

## 2015-01-30 ENCOUNTER — Other Ambulatory Visit (HOSPITAL_BASED_OUTPATIENT_CLINIC_OR_DEPARTMENT_OTHER): Payer: BLUE CROSS/BLUE SHIELD

## 2015-01-30 VITALS — BP 120/46 | HR 73 | Temp 98.6°F

## 2015-01-30 DIAGNOSIS — D709 Neutropenia, unspecified: Secondary | ICD-10-CM | POA: Diagnosis not present

## 2015-01-30 DIAGNOSIS — C549 Malignant neoplasm of corpus uteri, unspecified: Secondary | ICD-10-CM | POA: Diagnosis not present

## 2015-01-30 DIAGNOSIS — Z510 Encounter for antineoplastic radiation therapy: Secondary | ICD-10-CM | POA: Diagnosis not present

## 2015-01-30 LAB — CBC & DIFF AND RETIC
BASO%: 0.9 % (ref 0.0–2.0)
BASOS ABS: 0 10*3/uL (ref 0.0–0.1)
EOS%: 6.8 % (ref 0.0–7.0)
Eosinophils Absolute: 0.2 10*3/uL (ref 0.0–0.5)
HEMATOCRIT: 32 % — AB (ref 34.8–46.6)
HGB: 9.6 g/dL — ABNORMAL LOW (ref 11.6–15.9)
IMMATURE RETIC FRACT: 12.5 % — AB (ref 1.60–10.00)
LYMPH#: 1 10*3/uL (ref 0.9–3.3)
LYMPH%: 45 % (ref 14.0–49.7)
MCH: 25.7 pg (ref 25.1–34.0)
MCHC: 30 g/dL — AB (ref 31.5–36.0)
MCV: 85.6 fL (ref 79.5–101.0)
MONO#: 0.4 10*3/uL (ref 0.1–0.9)
MONO%: 15.8 % — ABNORMAL HIGH (ref 0.0–14.0)
NEUT%: 31.5 % — ABNORMAL LOW (ref 38.4–76.8)
NEUTROS ABS: 0.7 10*3/uL — AB (ref 1.5–6.5)
NRBC: 0 % (ref 0–0)
Platelets: 323 10*3/uL (ref 145–400)
RBC: 3.74 10*6/uL (ref 3.70–5.45)
RDW: 16.1 % — AB (ref 11.2–14.5)
Retic %: 1.28 % (ref 0.70–2.10)
Retic Ct Abs: 47.87 10*3/uL (ref 33.70–90.70)
WBC: 2.2 10*3/uL — ABNORMAL LOW (ref 3.9–10.3)

## 2015-01-30 MED ORDER — TBO-FILGRASTIM 480 MCG/0.8ML ~~LOC~~ SOSY
480.0000 ug | PREFILLED_SYRINGE | SUBCUTANEOUS | Status: DC
  Administered 2015-01-30: 480 ug via SUBCUTANEOUS
  Filled 2015-01-30: qty 0.8

## 2015-01-31 ENCOUNTER — Ambulatory Visit
Admission: RE | Admit: 2015-01-31 | Discharge: 2015-01-31 | Disposition: A | Discharge status: Home / Self Care | Payer: BLUE CROSS/BLUE SHIELD | Source: Ambulatory Visit | Attending: Radiation Oncology | Admitting: Radiation Oncology

## 2015-01-31 DIAGNOSIS — Z510 Encounter for antineoplastic radiation therapy: Secondary | ICD-10-CM | POA: Diagnosis not present

## 2015-01-31 DIAGNOSIS — C549 Malignant neoplasm of corpus uteri, unspecified: Secondary | ICD-10-CM

## 2015-01-31 NOTE — Progress Notes (Signed)
Pt here for patient teaching.  Pt given Radiation and You booklet. Reviewed areas of pertinence such as diarrhea, fatigue, hair loss, nausea and vomiting, skin changes and urinary and bladder changes . Pt able to give teach back of to pat skin, use unscented/gentle soap, use baby wipes, have Imodium on hand and drink plenty of water,avoid applying anything to skin within 4 hours of treatment. Pt demonstrated understanding and verbalizes understanding of information given and will contact nursing with any questions or concerns.

## 2015-02-03 DIAGNOSIS — Z510 Encounter for antineoplastic radiation therapy: Secondary | ICD-10-CM | POA: Diagnosis not present

## 2015-02-04 ENCOUNTER — Encounter: Payer: Self-pay | Admitting: Radiation Oncology

## 2015-02-04 ENCOUNTER — Ambulatory Visit
Admission: RE | Admit: 2015-02-04 | Discharge: 2015-02-04 | Disposition: A | Discharge status: Home / Self Care | Payer: BLUE CROSS/BLUE SHIELD | Source: Ambulatory Visit | Attending: Radiation Oncology | Admitting: Radiation Oncology

## 2015-02-04 VITALS — BP 129/70 | HR 72 | Temp 97.5°F | Resp 16 | Ht 62.0 in | Wt 234.4 lb

## 2015-02-04 DIAGNOSIS — R1900 Intra-abdominal and pelvic swelling, mass and lump, unspecified site: Secondary | ICD-10-CM

## 2015-02-04 DIAGNOSIS — Z510 Encounter for antineoplastic radiation therapy: Secondary | ICD-10-CM | POA: Diagnosis not present

## 2015-02-04 NOTE — Progress Notes (Signed)
[name] has completed 4 fractions to her per endometrium.  She had her urinary stents removed on Friday.  She saw 3 small blood clots this morning and saw pink when she wiped this morning.  She reports she will not have a follow up with Dr. Carrie Mew and is concerned about monitoring her hydronephrosis.  She reports having lower abdominal cramping.  She is taking aleve twice a day.  She reports having loose stools in her colostomy bag last week and over the weekend.  She reports today her stools are more normal.  She reports having mucus drainage from her rectum.  She has lost 4 lbs since 01/20/15.  She is trying to eat smaller more frequent meals.  She reports fatigue.  BP 129/70 mmHg  Pulse 72  Temp(Src) 97.5 F (36.4 C) (Oral)  Resp 16  Ht 5\' 2"  (1.575 m)  Wt 234 lb 6.4 oz (106.323 kg)  BMI 42.86 kg/m2

## 2015-02-04 NOTE — Progress Notes (Signed)
  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 . . . . . . . The patient presents for routine under treatment assessment.                                   The patient has had some mild loose bowels but no significant diarrhea. She did have her urinary stents removed recently by Dr. Carrie Mew.  She is having some mucus drainage from the rectum area.                                 Set-up films were reviewed.                                 The chart was checked. Physical Findings. . .  height is 5\' 2"  (1.575 m) and weight is 234 lb 6.4 oz (106.323 kg). Her oral temperature is 97.5 F (36.4 C). Her blood pressure is 129/70 and her pulse is 72. Her respiration is 16. . The lungs are clear. The heart has a regular rhythm and rate. Abdomen is soft and nontender with normal bowel sounds. Impression . . . . . . . The patient is tolerating radiation. Plan . . . . . . . . . . . . Continue treatment as planned.  ________________________________   Blair Promise, PhD, MD

## 2015-02-05 ENCOUNTER — Ambulatory Visit
Admission: RE | Admit: 2015-02-05 | Discharge: 2015-02-05 | Disposition: A | Discharge status: Home / Self Care | Payer: BLUE CROSS/BLUE SHIELD | Source: Ambulatory Visit | Attending: Radiation Oncology | Admitting: Radiation Oncology

## 2015-02-05 DIAGNOSIS — Z510 Encounter for antineoplastic radiation therapy: Secondary | ICD-10-CM | POA: Diagnosis not present

## 2015-02-06 ENCOUNTER — Ambulatory Visit (HOSPITAL_BASED_OUTPATIENT_CLINIC_OR_DEPARTMENT_OTHER): Payer: BLUE CROSS/BLUE SHIELD

## 2015-02-06 ENCOUNTER — Other Ambulatory Visit (HOSPITAL_BASED_OUTPATIENT_CLINIC_OR_DEPARTMENT_OTHER): Payer: BLUE CROSS/BLUE SHIELD

## 2015-02-06 ENCOUNTER — Ambulatory Visit
Admission: RE | Admit: 2015-02-06 | Discharge: 2015-02-06 | Disposition: A | Discharge status: Home / Self Care | Payer: BLUE CROSS/BLUE SHIELD | Source: Ambulatory Visit | Attending: Radiation Oncology | Admitting: Radiation Oncology

## 2015-02-06 VITALS — BP 130/60 | HR 66 | Temp 98.2°F

## 2015-02-06 DIAGNOSIS — C549 Malignant neoplasm of corpus uteri, unspecified: Secondary | ICD-10-CM | POA: Diagnosis not present

## 2015-02-06 DIAGNOSIS — D709 Neutropenia, unspecified: Secondary | ICD-10-CM

## 2015-02-06 DIAGNOSIS — Z510 Encounter for antineoplastic radiation therapy: Secondary | ICD-10-CM | POA: Diagnosis not present

## 2015-02-06 LAB — CBC & DIFF AND RETIC
BASO%: 2.8 % — ABNORMAL HIGH (ref 0.0–2.0)
Basophils Absolute: 0 10*3/uL (ref 0.0–0.1)
EOS%: 20.6 % — ABNORMAL HIGH (ref 0.0–7.0)
Eosinophils Absolute: 0.3 10*3/uL (ref 0.0–0.5)
HEMATOCRIT: 33.5 % — AB (ref 34.8–46.6)
HGB: 10.3 g/dL — ABNORMAL LOW (ref 11.6–15.9)
IMMATURE RETIC FRACT: 9.6 % (ref 1.60–10.00)
LYMPH#: 0.6 10*3/uL — AB (ref 0.9–3.3)
LYMPH%: 44.7 % (ref 14.0–49.7)
MCH: 26.2 pg (ref 25.1–34.0)
MCHC: 30.7 g/dL — ABNORMAL LOW (ref 31.5–36.0)
MCV: 85.2 fL (ref 79.5–101.0)
MONO#: 0.3 10*3/uL (ref 0.1–0.9)
MONO%: 24.1 % — ABNORMAL HIGH (ref 0.0–14.0)
NEUT#: 0.1 10*3/uL — CL (ref 1.5–6.5)
NEUT%: 7.8 % — AB (ref 38.4–76.8)
NRBC: 0 % (ref 0–0)
Platelets: 367 10*3/uL (ref 145–400)
RBC: 3.93 10*6/uL (ref 3.70–5.45)
RDW: 15.7 % — ABNORMAL HIGH (ref 11.2–14.5)
Retic %: 1.62 % (ref 0.70–2.10)
Retic Ct Abs: 63.67 10*3/uL (ref 33.70–90.70)
WBC: 1.4 10*3/uL — ABNORMAL LOW (ref 3.9–10.3)

## 2015-02-06 MED ORDER — TBO-FILGRASTIM 480 MCG/0.8ML ~~LOC~~ SOSY
480.0000 ug | PREFILLED_SYRINGE | SUBCUTANEOUS | Status: DC
  Administered 2015-02-06: 480 ug via SUBCUTANEOUS
  Filled 2015-02-06: qty 0.8

## 2015-02-07 ENCOUNTER — Ambulatory Visit
Admission: RE | Admit: 2015-02-07 | Discharge: 2015-02-07 | Disposition: A | Discharge status: Home / Self Care | Payer: BLUE CROSS/BLUE SHIELD | Source: Ambulatory Visit | Attending: Radiation Oncology | Admitting: Radiation Oncology

## 2015-02-07 DIAGNOSIS — Z510 Encounter for antineoplastic radiation therapy: Secondary | ICD-10-CM | POA: Diagnosis not present

## 2015-02-10 ENCOUNTER — Ambulatory Visit
Admission: RE | Admit: 2015-02-10 | Discharge: 2015-02-10 | Disposition: A | Discharge status: Home / Self Care | Payer: BLUE CROSS/BLUE SHIELD | Source: Ambulatory Visit | Attending: Radiation Oncology | Admitting: Radiation Oncology

## 2015-02-10 DIAGNOSIS — Z510 Encounter for antineoplastic radiation therapy: Secondary | ICD-10-CM | POA: Diagnosis not present

## 2015-02-11 ENCOUNTER — Ambulatory Visit
Admission: RE | Admit: 2015-02-11 | Discharge: 2015-02-11 | Disposition: A | Discharge status: Home / Self Care | Payer: BLUE CROSS/BLUE SHIELD | Source: Ambulatory Visit | Attending: Radiation Oncology | Admitting: Radiation Oncology

## 2015-02-11 ENCOUNTER — Encounter: Payer: Self-pay | Admitting: Radiation Oncology

## 2015-02-11 VITALS — BP 118/73 | HR 74 | Temp 97.8°F | Resp 16 | Ht 62.0 in | Wt 233.8 lb

## 2015-02-11 DIAGNOSIS — Z510 Encounter for antineoplastic radiation therapy: Secondary | ICD-10-CM | POA: Diagnosis not present

## 2015-02-11 DIAGNOSIS — C549 Malignant neoplasm of corpus uteri, unspecified: Secondary | ICD-10-CM

## 2015-02-11 NOTE — Progress Notes (Signed)
[name] has completed 9 fractions to her endometrium.  She reports cramping pain in her bladder that started 2 weeks ago.  She reports it comes and goes and is sometimes is relieved after urinating.  She has been taking Aleve which helps.  She reports having a light pink spotting yesterday.  She reports seeing clear mucus drainage from her rectum yesterday.  She reports taking 2 Imodium yesterday.  She reports she emptied her colostomy bag 2-3 times yesterday with more formed consistency last night.  She denies nausea.  She reports her skin is intact.  She reports occasional fatigue.  BP 118/73 mmHg  Pulse 74  Temp(Src) 97.8 F (36.6 C) (Oral)  Resp 16  Ht 5\' 2"  (1.575 m)  Wt 233 lb 12.8 oz (106.051 kg)  BMI 42.75 kg/m2

## 2015-02-11 NOTE — Progress Notes (Signed)
  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 . . . . . . . The patient presents for routine under treatment assessment.                                    She reports cramping pain in her bladder that started 2 weeks ago. She reports it comes and goes and is sometimes is relieved after urinating. She has been taking Aleve which helps. She reports having a light pink spotting yesterday. She reports seeing clear mucus drainage from her rectum yesterday. She reports taking 2 Imodium yesterday. She reports she emptied her colostomy bag 2-3 times yesterday with more formed consistency last night. She denies nausea. She reports her skin is intact. She reports occasional fatigue.                                 Set-up films were reviewed.                                 The chart was checked. Physical Findings. . .  height is 5\' 2"  (1.575 m) and weight is 233 lb 12.8 oz (106.051 kg). Her oral temperature is 97.8 F (36.6 C). Her blood pressure is 118/73 and her pulse is 74. Her respiration is 16. . The lungs are clear. The heart has a regular rhythm and rate. The abdomen is soft and nontender with normal bowel sounds. Impression . . . . . . . The patient is tolerating radiation. Plan . . . . . . . . . . . . Continue treatment as planned.  ________________________________   Blair Promise, PhD, MD

## 2015-02-12 ENCOUNTER — Ambulatory Visit
Admission: RE | Admit: 2015-02-12 | Discharge: 2015-02-12 | Disposition: A | Discharge status: Home / Self Care | Payer: BLUE CROSS/BLUE SHIELD | Source: Ambulatory Visit | Attending: Radiation Oncology | Admitting: Radiation Oncology

## 2015-02-12 DIAGNOSIS — Z510 Encounter for antineoplastic radiation therapy: Secondary | ICD-10-CM | POA: Diagnosis not present

## 2015-02-13 ENCOUNTER — Ambulatory Visit (HOSPITAL_BASED_OUTPATIENT_CLINIC_OR_DEPARTMENT_OTHER): Payer: BLUE CROSS/BLUE SHIELD

## 2015-02-13 ENCOUNTER — Ambulatory Visit
Admission: RE | Admit: 2015-02-13 | Discharge: 2015-02-13 | Disposition: A | Discharge status: Home / Self Care | Payer: BLUE CROSS/BLUE SHIELD | Source: Ambulatory Visit | Attending: Radiation Oncology | Admitting: Radiation Oncology

## 2015-02-13 ENCOUNTER — Other Ambulatory Visit (HOSPITAL_BASED_OUTPATIENT_CLINIC_OR_DEPARTMENT_OTHER): Payer: BLUE CROSS/BLUE SHIELD

## 2015-02-13 VITALS — BP 103/65 | HR 79 | Temp 98.2°F

## 2015-02-13 DIAGNOSIS — D709 Neutropenia, unspecified: Secondary | ICD-10-CM | POA: Diagnosis not present

## 2015-02-13 DIAGNOSIS — Z510 Encounter for antineoplastic radiation therapy: Secondary | ICD-10-CM | POA: Diagnosis not present

## 2015-02-13 LAB — CBC & DIFF AND RETIC
BASO%: 2.9 % — ABNORMAL HIGH (ref 0.0–2.0)
Basophils Absolute: 0.1 10*3/uL (ref 0.0–0.1)
EOS%: 16.4 % — AB (ref 0.0–7.0)
Eosinophils Absolute: 0.3 10*3/uL (ref 0.0–0.5)
HEMATOCRIT: 35.9 % (ref 34.8–46.6)
HEMOGLOBIN: 11.2 g/dL — AB (ref 11.6–15.9)
IMMATURE RETIC FRACT: 13.2 % — AB (ref 1.60–10.00)
LYMPH%: 25.7 % (ref 14.0–49.7)
MCH: 26.5 pg (ref 25.1–34.0)
MCHC: 31.2 g/dL — ABNORMAL LOW (ref 31.5–36.0)
MCV: 85.1 fL (ref 79.5–101.0)
MONO#: 0.4 10*3/uL (ref 0.1–0.9)
MONO%: 22.8 % — AB (ref 0.0–14.0)
NEUT#: 0.6 10*3/uL — ABNORMAL LOW (ref 1.5–6.5)
NEUT%: 32.2 % — AB (ref 38.4–76.8)
Platelets: 234 10*3/uL (ref 145–400)
RBC: 4.22 10*6/uL (ref 3.70–5.45)
RDW: 16.4 % — ABNORMAL HIGH (ref 11.2–14.5)
RETIC %: 1.69 % (ref 0.70–2.10)
Retic Ct Abs: 71.32 10*3/uL (ref 33.70–90.70)
WBC: 1.7 10*3/uL — ABNORMAL LOW (ref 3.9–10.3)
lymph#: 0.4 10*3/uL — ABNORMAL LOW (ref 0.9–3.3)
nRBC: 0 % (ref 0–0)

## 2015-02-13 MED ORDER — TBO-FILGRASTIM 480 MCG/0.8ML ~~LOC~~ SOSY
480.0000 ug | PREFILLED_SYRINGE | SUBCUTANEOUS | Status: DC
  Administered 2015-02-13: 480 ug via SUBCUTANEOUS
  Filled 2015-02-13: qty 0.8

## 2015-02-14 ENCOUNTER — Ambulatory Visit
Admission: RE | Admit: 2015-02-14 | Discharge: 2015-02-14 | Disposition: A | Discharge status: Home / Self Care | Payer: BLUE CROSS/BLUE SHIELD | Source: Ambulatory Visit | Attending: Radiation Oncology | Admitting: Radiation Oncology

## 2015-02-14 DIAGNOSIS — Z510 Encounter for antineoplastic radiation therapy: Secondary | ICD-10-CM | POA: Diagnosis not present

## 2015-02-17 ENCOUNTER — Ambulatory Visit
Admission: RE | Admit: 2015-02-17 | Discharge: 2015-02-17 | Disposition: A | Discharge status: Home / Self Care | Payer: BLUE CROSS/BLUE SHIELD | Source: Ambulatory Visit | Attending: Radiation Oncology | Admitting: Radiation Oncology

## 2015-02-17 DIAGNOSIS — Z510 Encounter for antineoplastic radiation therapy: Secondary | ICD-10-CM | POA: Diagnosis not present

## 2015-02-18 ENCOUNTER — Encounter: Payer: Self-pay | Admitting: Radiation Oncology

## 2015-02-18 ENCOUNTER — Ambulatory Visit
Admission: RE | Admit: 2015-02-18 | Discharge: 2015-02-18 | Disposition: A | Discharge status: Home / Self Care | Payer: BLUE CROSS/BLUE SHIELD | Source: Ambulatory Visit | Attending: Radiation Oncology | Admitting: Radiation Oncology

## 2015-02-18 VITALS — BP 115/70 | HR 70 | Temp 97.7°F | Resp 16 | Ht 62.0 in | Wt 231.6 lb

## 2015-02-18 DIAGNOSIS — R1900 Intra-abdominal and pelvic swelling, mass and lump, unspecified site: Secondary | ICD-10-CM

## 2015-02-18 DIAGNOSIS — Z510 Encounter for antineoplastic radiation therapy: Secondary | ICD-10-CM | POA: Diagnosis not present

## 2015-02-18 LAB — URINALYSIS, MICROSCOPIC - CHCC
Bilirubin (Urine): NEGATIVE
GLUCOSE UR CHCC: 2000 mg/dL
Ketones: NEGATIVE mg/dL
Nitrite: NEGATIVE
PH: 7 (ref 4.6–8.0)
SPECIFIC GRAVITY, URINE: 1.005 (ref 1.003–1.035)
Urobilinogen, UR: 0.2 mg/dL (ref 0.2–1)

## 2015-02-18 MED ORDER — OXYCODONE HCL 5 MG PO TABS: 5.0000 mg | ORAL_TABLET | ORAL | Status: DC | PRN

## 2015-02-18 NOTE — Progress Notes (Signed)
  Radiation Oncology         (336) [phone] ________________________________  Name: [name] MRN: [id number]  Date: 02/18/2015  DOB: 26-Sep-1955  Weekly Radiation Therapy Management    ICD-9-CM ICD-10-CM   1. 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 s/p resection/partial vaginectomy/ LAR/colostomy 11/19/2014 789.30 R19.00 Urinalysis, Microscopic - [facility]     Urine culture     Current Dose: 25.2 Gy     Planned Dose:  50.4 Gy  Narrative . . . . . . . . The patient presents for routine under treatment assessment.                                    She reports pain in her suprapubic area and rates it at a 5/10. She reports having liquid stools in her colostomy bag. She is taking 2 imodium per day. She reports not urinating as much and is concerned that she is not emptying her bladder. She reports occasional nausea. She reports seeing a pinpoint of blood when wiping and thinks it is coming from her urethra. She reports her scar from her past colostomy has been peeling. She will be given radaipalex gel. She reports fatigue                                 Set-up films were reviewed.                                 The chart was checked. Physical Findings. . .  height is 5\' 2"  (1.575 m) and weight is 231 lb 9.6 oz (105.053 kg). Her oral temperature is 97.7 F (36.5 C). Her blood pressure is 115/70 and her pulse is 70. Her respiration is 16. . The lungs are clear. The heart has a regular rhythm and rate. The abdomen is soft and nontender with normal bowel sounds. Impression . . . . . . . The patient is tolerating radiation. Plan . . . . . . . . . . . . Continue treatment as planned. The patient presented lab for urinalysis culture and sensitivity. I have encouraged increased water intake to avoid dehydration.  ________________________________   Blair Promise, PhD, MD

## 2015-02-18 NOTE — Progress Notes (Addendum)
[name] has complete 14 fractions to her endometrium.  She reports pain in her suprapubic area and rates it at a 5/10.  She reports having liquid stools in her colostomy bag.  She is taking 2 imodium per day.  She reports not urinating as much and is concerned that she is not emptying her bladder.  She reports occasional nausea.  She reports seeing a pinpoint of blood when wiping and thinks it is coming from her urethra.  She reports her scar from her past colostomy has been peeling.  She will be given radaipalex gel.  She reports fatigue.  BP 115/70 mmHg  Pulse 70  Temp(Src) 97.7 F (36.5 C) (Oral)  Resp 16  Ht 5\' 2"  (1.575 m)  Wt 231 lb 9.6 oz (105.053 kg)  BMI 42.35 kg/m2

## 2015-02-19 ENCOUNTER — Ambulatory Visit
Admission: RE | Admit: 2015-02-19 | Discharge: 2015-02-19 | Disposition: A | Discharge status: Home / Self Care | Payer: BLUE CROSS/BLUE SHIELD | Source: Ambulatory Visit | Attending: Radiation Oncology | Admitting: Radiation Oncology

## 2015-02-19 DIAGNOSIS — Z510 Encounter for antineoplastic radiation therapy: Secondary | ICD-10-CM | POA: Diagnosis not present

## 2015-02-19 MED ORDER — RADIAPLEXRX EX GEL
Freq: Once | CUTANEOUS | Status: AC
  Administered 2015-02-19: 12:00:00 via TOPICAL

## 2015-02-19 NOTE — Addendum Note (Signed)
Encounter addended by: Jacqulyn Liner, RN on: 02/19/2015 11:35 AM<BR>     Documentation filed: Medications, Orders, Dx Association

## 2015-02-19 NOTE — Addendum Note (Signed)
Encounter addended by: Jacqulyn Liner, RN on: 02/19/2015 11:41 AM<BR>     Documentation filed: Inpatient MAR

## 2015-02-20 ENCOUNTER — Ambulatory Visit
Admission: RE | Admit: 2015-02-20 | Discharge: 2015-02-20 | Disposition: A | Discharge status: Home / Self Care | Payer: BLUE CROSS/BLUE SHIELD | Source: Ambulatory Visit | Attending: Radiation Oncology | Admitting: Radiation Oncology

## 2015-02-20 ENCOUNTER — Telehealth: Payer: Self-pay | Admitting: Hematology and Oncology

## 2015-02-20 ENCOUNTER — Other Ambulatory Visit (HOSPITAL_BASED_OUTPATIENT_CLINIC_OR_DEPARTMENT_OTHER): Payer: BLUE CROSS/BLUE SHIELD

## 2015-02-20 ENCOUNTER — Ambulatory Visit (HOSPITAL_BASED_OUTPATIENT_CLINIC_OR_DEPARTMENT_OTHER): Payer: BLUE CROSS/BLUE SHIELD | Admitting: Hematology and Oncology

## 2015-02-20 ENCOUNTER — Encounter: Payer: Self-pay | Admitting: Hematology and Oncology

## 2015-02-20 ENCOUNTER — Ambulatory Visit: Payer: BLUE CROSS/BLUE SHIELD

## 2015-02-20 VITALS — BP 116/52 | HR 76 | Temp 97.4°F | Resp 20 | Ht 62.0 in | Wt 223.9 lb

## 2015-02-20 DIAGNOSIS — R197 Diarrhea, unspecified: Secondary | ICD-10-CM | POA: Diagnosis not present

## 2015-02-20 DIAGNOSIS — D709 Neutropenia, unspecified: Secondary | ICD-10-CM

## 2015-02-20 DIAGNOSIS — C549 Malignant neoplasm of corpus uteri, unspecified: Secondary | ICD-10-CM

## 2015-02-20 DIAGNOSIS — Z510 Encounter for antineoplastic radiation therapy: Secondary | ICD-10-CM | POA: Diagnosis not present

## 2015-02-20 DIAGNOSIS — D630 Anemia in neoplastic disease: Secondary | ICD-10-CM | POA: Diagnosis not present

## 2015-02-20 LAB — CBC & DIFF AND RETIC
BASO%: 1.5 % (ref 0.0–2.0)
Basophils Absolute: 0 10*3/uL (ref 0.0–0.1)
EOS%: 19.2 % — ABNORMAL HIGH (ref 0.0–7.0)
Eosinophils Absolute: 0.3 10*3/uL (ref 0.0–0.5)
HCT: 34.1 % — ABNORMAL LOW (ref 34.8–46.6)
HEMOGLOBIN: 10.6 g/dL — AB (ref 11.6–15.9)
IMMATURE RETIC FRACT: 11.6 % — AB (ref 1.60–10.00)
LYMPH%: 21.5 % (ref 14.0–49.7)
MCH: 26.4 pg (ref 25.1–34.0)
MCHC: 31.1 g/dL — AB (ref 31.5–36.0)
MCV: 84.8 fL (ref 79.5–101.0)
MONO#: 0.4 10*3/uL (ref 0.1–0.9)
MONO%: 32.3 % — ABNORMAL HIGH (ref 0.0–14.0)
NEUT%: 25.5 % — ABNORMAL LOW (ref 38.4–76.8)
NEUTROS ABS: 0.3 10*3/uL — AB (ref 1.5–6.5)
PLATELETS: 169 10*3/uL (ref 145–400)
RBC: 4.02 10*6/uL (ref 3.70–5.45)
RDW: 17.1 % — ABNORMAL HIGH (ref 11.2–14.5)
RETIC %: 1.67 % (ref 0.70–2.10)
Retic Ct Abs: 67.13 10*3/uL (ref 33.70–90.70)
WBC: 1.3 10*3/uL — AB (ref 3.9–10.3)
lymph#: 0.3 10*3/uL — ABNORMAL LOW (ref 0.9–3.3)
nRBC: 0 % (ref 0–0)

## 2015-02-20 LAB — URINE CULTURE

## 2015-02-20 MED ORDER — DIPHENOXYLATE-ATROPINE 2.5-0.025 MG PO TABS: 1.0000 | ORAL_TABLET | Freq: Four times a day (QID) | ORAL | Status: DC | PRN

## 2015-02-20 MED ORDER — TBO-FILGRASTIM 480 MCG/0.8ML ~~LOC~~ SOSY
480.0000 ug | PREFILLED_SYRINGE | SUBCUTANEOUS | Status: DC
  Administered 2015-02-20: 480 ug via SUBCUTANEOUS
  Filled 2015-02-20: qty 0.8

## 2015-02-20 NOTE — Progress Notes (Signed)
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 Going to the patient, initial diagnosis with ovarian cancer was when she presented with abdomen and no swelling and pelvic pain leading to her urgent care visit. She states that the tumor had caused torsion of the ovary. She cannot remember the stage of her disease. She began to develop leukopenia in approximately July of 2010. She underwent an initial bone marrow biopsy at [facility] in [place] on 03/17/2009. but it was nondiagnostic. No gross dysplastic changes, no excess blasts, normal cytogenetics.  Her counts have been stable over observation through this office since December 2010 with some minor fluctuations. Total white counts run as low as 1800 and as high as 2900. Percent neutrophils fluctuates very widely from as low as 4% recorded on a CBC done 02/03/2010 to as high as 31% with average being 20%. She was hospitalized in the ICU at that time  Both hemoglobin and platelets have remained stable over time.  Despite leukopenia, she has not had any recurrent or severe infections. She stated strong family history of lupus in a cousin. She complained of occasional skin rash and joint stiffness. 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 She received G-CSF in January of 2015  to stimulate white blood cell production around a colonoscopy procedure a few years ago. She had a limited response. On 01/23/2014, repeat bone marrow biopsy excluded lymphoma or leukemia. On 09/12/2014, CT scan of the abdomen and pelvis show possible relapse of gynecological Malignancy On 10/14/2014, MRI of the pelvis show significant pelvic mass without invasion into the bladder but abutting to the rectum On 11/19/2014, she underwent surgery and had robotic-assisted lysis of adhesions, converted to laparotomy, radical upper vaginectomy and low anterior resection with colostomy. Bilateral ureteral stent placement and cystotomy repair. 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 Resection margins were negative. She was hospitalized from 11/19/2014 to 11/25/2014. In May, showed recurrent admission to the hospital due to complication with infection. On 01/29/2015, she began adjuvant radiation treatment  INTERVAL HISTORY: Please see below for problem oriented charting. She complained of sensation of cystitis, mild hematuria and diarrhea. She denies dehydration. She denies signs and symptoms of anemia such as dizziness, shortness of breath, headache or chest pain. She has been taking Imodium as needed for diarrhea. She had urinalysis performed recently which excluded urinary tract infection. She is taking some pain medicine as needed for pain  REVIEW OF SYSTEMS:   Constitutional: Denies fevers, chills or abnormal weight loss Eyes: Denies blurriness of vision Ears, nose, mouth, throat, and face: Denies mucositis or sore throat Respiratory: Denies cough, dyspnea or wheezes Cardiovascular: Denies palpitation, chest discomfort or lower extremity swelling Skin: Denies abnormal skin rashes Lymphatics: Denies new lymphadenopathy or easy bruising Neurological:Denies numbness, tingling or new  weaknesses Behavioral/Psych: Mood is stable, no new changes  All other systems were reviewed with the patient and are  negative.  I have reviewed the past medical history, past surgical history, social history and family history with the patient and they are unchanged from previous note.  ALLERGIES:  is allergic to penicillins; ultram; adhesive; cefaclor; erythromycin; trimethoprim; and sulfa antibiotics.  MEDICATIONS:  Current Outpatient Prescriptions  Medication Sig Dispense Refill  . Alum & Mag Hydroxide-Simeth (MAGIC MOUTHWASH) SOLN Take 5 mLs by mouth daily as needed for mouth pain (mouth pain).    . Biotin 5 MG TABS Take by mouth.    . Calcium Carbonate-Vitamin D (CALCIUM + D PO) Take 2 tablets by mouth daily.    . Canagliflozin (INVOKANA) 300 MG TABS Take 1 tablet by mouth every morning.     . Cholecalciferol (VITAMIN D3) 10000 UNITS capsule Take 10,000 Units by mouth once a week. Sundays    . diphenoxylate-atropine (LOMOTIL) 2.5-0.025 MG per tablet Take 1 tablet by mouth 4 (four) times daily as needed for diarrhea or loose stools. 60 tablet 0  . ferrous sulfate 325 (65 FE) MG tablet Take 1 tablet (325 mg total) by mouth 2 (two) times daily with a meal. 60 tablet 3  . hyaluronate sodium (RADIAPLEXRX) GEL Apply 1 application topically 2 (two) times daily.    [name] Kitchen ibuprofen (ADVIL,MOTRIN) 800 MG tablet Take 800 mg by mouth every 8 (eight) hours as needed for moderate pain (pain).    [name] Kitchen levothyroxine (SYNTHROID) 175 MCG tablet Take 175 mcg by mouth daily before breakfast.    . loperamide (IMODIUM) 2 MG capsule Take by mouth as needed for diarrhea or loose stools.    . magnesium chloride (SLOW-MAG) 64 MG TBEC SR tablet Take 2 tablets (128 mg total) by mouth 2 (two) times daily. 120 tablet 0  . metFORMIN (GLUCOPHAGE) 1000 MG tablet Take 1,000 mg by mouth 2 (two) times daily with a meal.     . Multiple Vitamin (MULTIVITAMIN WITH MINERALS) TABS tablet Take 1 tablet by mouth daily.    . naproxen  sodium (ANAPROX) 220 MG tablet Take 220 mg by mouth 2 (two) times daily with a meal.    . omega-3 acid ethyl esters (LOVAZA) 1 G capsule Take 1 g by mouth 2 (two) times daily.    [name] Kitchen omeprazole (PRILOSEC) 20 MG capsule Take 20 mg by mouth at bedtime.     . ondansetron (ZOFRAN) 4 MG tablet Take 1 tablet (4 mg total) by mouth every 6 (six) hours as needed for nausea. 20 tablet 0  . ONETOUCH VERIO test strip     . oxyCODONE (OXY IR/ROXICODONE) 5 MG immediate release tablet Take 1 tablet (5 mg total) by mouth every 4 (four) hours as needed for moderate pain or severe pain. 30 tablet 0  . Polyethyl Glycol-Propyl Glycol (SYSTANE OP) Apply 1 drop to eye daily as needed (Dry eyes).     . potassium chloride SA (K-DUR,KLOR-CON) 20 MEQ tablet Take 1 tablet (20 mEq total) by mouth daily. 30 tablet 0  . rosuvastatin (CRESTOR) 10 MG tablet Take 10 mg by mouth every evening.     . saccharomyces boulardii (FLORASTOR) 250 MG capsule Take 1 capsule (250 mg total) by mouth 2 (two) times daily. 60 capsule 2  . sitaGLIPtin (JANUVIA) 100 MG tablet Take 100 mg by mouth daily.    . Tbo-Filgrastim (GRANIX) 480 MCG/0.8ML SOSY injection Inject 480 mcg into the skin once a week. @[facility]     . valsartan (DIOVAN) 320 MG tablet Take 320 mg by mouth daily.    . ciprofloxacin (CIPRO) 500  MG tablet TK 1 T PO  BID STARTING 3 DAYS BEFORE NEXT UROLOGY APPOINTMENT  0   Current Facility-Administered Medications  Medication Dose Route Frequency Provider Last Rate Last Dose  . Tbo-Filgrastim (GRANIX) injection 480 mcg  480 mcg Subcutaneous Weekly Heath Lark, MD   480 mcg at 02/20/15 1238   Facility-Administered Medications Ordered in Other Visits  Medication Dose Route Frequency Provider Last Rate Last Dose  . Tbo-Filgrastim (GRANIX) injection 480 mcg  480 mcg Subcutaneous Weekly Heath Lark, MD   480 mcg at 11/29/14 1338    PHYSICAL EXAMINATION: ECOG PERFORMANCE STATUS: 1 - Symptomatic but completely ambulatory  Filed Vitals:    02/20/15 1218  BP: 116/52  Pulse: 76  Temp: 97.4 F (36.3 C)  Resp: 20   Filed Weights   02/20/15 1218  Weight: 223 lb 14.4 oz (101.56 kg)    GENERAL:alert, no distress and comfortable. She is mildly obese and appeared pale SKIN: skin color, texture, turgor are normal, no rashes or significant lesions EYES: normal, Conjunctiva are pink and non-injected, sclera clear OROPHARYNX:no exudate, no erythema and lips, buccal mucosa, and tongue normal  Musculoskeletal:no cyanosis of digits and no clubbing  NEURO: alert & oriented x 3 with fluent speech, no focal motor/sensory deficits  LABORATORY DATA:  I have reviewed the data as listed    Component Value Date/Time   NA 141 01/06/2015 1610   NA 140 11/29/2014 1251   K 4.0 01/06/2015 1610   K 3.5 11/29/2014 1251   CL 105 01/06/2015 1610   CO2 24 01/06/2015 1610   CO2 30* 11/29/2014 1251   GLUCOSE 126* 01/06/2015 1610   GLUCOSE 168* 11/29/2014 1251   BUN 14.1 01/22/2015 1524   BUN 17 01/06/2015 1610   CREATININE 0.8 01/22/2015 1524   CREATININE 0.80 01/06/2015 1610   CREATININE 0.63 01/02/2015 0400   CALCIUM 9.6 01/06/2015 1610   CALCIUM 9.3 11/29/2014 1251   PROT 6.7 12/31/2014 0638   PROT 6.4 11/29/2014 1251   ALBUMIN 2.8* 12/31/2014 0638   ALBUMIN 2.6* 11/29/2014 1251   AST 11* 12/31/2014 0638   AST 15 11/29/2014 1251   ALT 12* 12/31/2014 0638   ALT 17 11/29/2014 1251   ALKPHOS 57 12/31/2014 0638   ALKPHOS 77 11/29/2014 1251   BILITOT 0.6 12/31/2014 0638   BILITOT 0.34 11/29/2014 1251   GFRNONAA >60 01/02/2015 0400   GFRNONAA 78 12/16/2014 1530   GFRAA >60 01/02/2015 0400   GFRAA >89 12/16/2014 1530    No results found for: SPEP, UPEP  Lab Results  Component Value Date   WBC 1.3* 02/20/2015   NEUTROABS 0.3* 02/20/2015   HGB 10.6* 02/20/2015   HCT 34.1* 02/20/2015   MCV 84.8 02/20/2015   PLT 169 02/20/2015      Chemistry      Component Value Date/Time   NA 141 01/06/2015 1610   NA 140 11/29/2014 1251    K 4.0 01/06/2015 1610   K 3.5 11/29/2014 1251   CL 105 01/06/2015 1610   CO2 24 01/06/2015 1610   CO2 30* 11/29/2014 1251   BUN 14.1 01/22/2015 1524   BUN 17 01/06/2015 1610   CREATININE 0.8 01/22/2015 1524   CREATININE 0.80 01/06/2015 1610   CREATININE 0.63 01/02/2015 0400      Component Value Date/Time   CALCIUM 9.6 01/06/2015 1610   CALCIUM 9.3 11/29/2014 1251   ALKPHOS 57 12/31/2014 0638   ALKPHOS 77 11/29/2014 1251   AST 11* 12/31/2014 0638   AST 15  11/29/2014 1251   ALT 12* 12/31/2014 0638   ALT 17 11/29/2014 1251   BILITOT 0.6 12/31/2014 0638   BILITOT 0.34 11/29/2014 1251     ASSESSMENT & PLAN:  Chronic neutropenia The patient responded well to G-CSF. There is no role for IVIG. I recommend she treat returns on a weekly basis for G-CSF injection to keep her [place] greater than 1500. Her ANC is getting lower due to side effects of radiation. Thankfully, she has no infectious complication. I will continue once a week for now until her radiation is completed. If she start to develop infectious complication, I might consider increasing G-CSF to twice a week.   Anemia in neoplastic disease This is likely anemia of chronic disease. The patient denies recent history of bleeding such as epistaxis, hematuria or hematochezia. She is asymptomatic from the anemia. We will observe for now.  She does not require transfusion now.    Cancer of corpus uteri, except isthmus I will defer to her surgical oncologist and radiation oncologist for further management. She is doing well with radiation up her from some sensation of cystitis and diarrhea.  Diarrhea She has diarrhea which I suspect could be due to radiation induced colitis/enteritis. It is not well controlled with Imodium. I gave her prescription of Lomotil to try. Also recommend she reduce her iron supplement to once a day as iron supplement can sometimes cause diarrhea as well.   No orders of the defined types were placed  in this encounter.   All questions were answered. The patient knows to call the clinic with any problems, questions or concerns. No barriers to learning was detected. I spent 25 minutes counseling the patient face to face. The total time spent in the appointment was 30 minutes and more than 50% was on counseling and review of test results     [facility], [place], MD 02/20/2015 12:51 PM

## 2015-02-20 NOTE — Assessment & Plan Note (Signed)
She has diarrhea which I suspect could be due to radiation induced colitis/enteritis. It is not well controlled with Imodium. I gave her prescription of Lomotil to try. Also recommend she reduce her iron supplement to once a day as iron supplement can sometimes cause diarrhea as well.

## 2015-02-20 NOTE — Assessment & Plan Note (Signed)
The patient responded well to G-CSF. There is no role for IVIG. I recommend she treat returns on a weekly basis for G-CSF injection to keep her [place] greater than 1500. Her ANC is getting lower due to side effects of radiation. Thankfully, she has no infectious complication. I will continue once a week for now until her radiation is completed. If she start to develop infectious complication, I might consider increasing G-CSF to twice a week.

## 2015-02-20 NOTE — Assessment & Plan Note (Signed)
This is likely anemia of chronic disease. The patient denies recent history of bleeding such as epistaxis, hematuria or hematochezia. She is asymptomatic from the anemia. We will observe for now.  She does not require transfusion now.   

## 2015-02-20 NOTE — Progress Notes (Signed)
Granix given by desk RN.

## 2015-02-20 NOTE — Telephone Encounter (Signed)
per pof to sch pt appt-gave pt copy of avs °

## 2015-02-20 NOTE — Assessment & Plan Note (Signed)
I will defer to her surgical oncologist and radiation oncologist for further management. She is doing well with radiation up her from some sensation of cystitis and diarrhea.

## 2015-02-21 ENCOUNTER — Ambulatory Visit
Admission: RE | Admit: 2015-02-21 | Discharge: 2015-02-21 | Disposition: A | Discharge status: Home / Self Care | Payer: BLUE CROSS/BLUE SHIELD | Source: Ambulatory Visit | Attending: Radiation Oncology | Admitting: Radiation Oncology

## 2015-02-21 DIAGNOSIS — Z510 Encounter for antineoplastic radiation therapy: Secondary | ICD-10-CM | POA: Diagnosis not present

## 2015-02-24 ENCOUNTER — Ambulatory Visit
Admission: RE | Admit: 2015-02-24 | Discharge: 2015-02-24 | Disposition: A | Discharge status: Home / Self Care | Payer: BLUE CROSS/BLUE SHIELD | Source: Ambulatory Visit | Attending: Radiation Oncology | Admitting: Radiation Oncology

## 2015-02-24 ENCOUNTER — Telehealth: Payer: Self-pay | Admitting: Oncology

## 2015-02-24 DIAGNOSIS — Z510 Encounter for antineoplastic radiation therapy: Secondary | ICD-10-CM | POA: Diagnosis not present

## 2015-02-24 NOTE — Telephone Encounter (Signed)
[place] and advised her of her urine culture results.  [name] said she is still not voiding easily and is seeing some spotting from her urethra.  She said she would like to wait until after she sees Dr. Sondra Come tomorrow before she repeats the urine culture.

## 2015-02-25 ENCOUNTER — Ambulatory Visit
Admission: RE | Admit: 2015-02-25 | Discharge: 2015-02-25 | Disposition: A | Discharge status: Home / Self Care | Payer: BLUE CROSS/BLUE SHIELD | Source: Ambulatory Visit | Attending: Radiation Oncology | Admitting: Radiation Oncology

## 2015-02-25 ENCOUNTER — Encounter: Payer: Self-pay | Admitting: Radiation Oncology

## 2015-02-25 VITALS — BP 105/61 | HR 66 | Temp 97.7°F | Resp 12 | Ht 62.0 in | Wt 231.9 lb

## 2015-02-25 DIAGNOSIS — R1900 Intra-abdominal and pelvic swelling, mass and lump, unspecified site: Secondary | ICD-10-CM

## 2015-02-25 DIAGNOSIS — Z510 Encounter for antineoplastic radiation therapy: Secondary | ICD-10-CM | POA: Diagnosis not present

## 2015-02-25 DIAGNOSIS — C549 Malignant neoplasm of corpus uteri, unspecified: Secondary | ICD-10-CM

## 2015-02-25 MED ORDER — OXYCODONE HCL 5 MG PO TABS: 5.0000 mg | ORAL_TABLET | ORAL | Status: DC | PRN

## 2015-02-25 NOTE — Progress Notes (Signed)
[name] has completed 19 fractions to her endometrium.  She reports pain due to abdominal cramping that started over the weekend.  She is rating the pain at a 6/10.  Dr. Alvy Bimler prescribed lomotil yesterday to see if it would help the cramping.  She reports her stool becomes more formed over the weekend and then turned into diarrhea on Monday. She Is taking oxycodone 5 mg 2-3 times a day.  She is requested a refill.  She reports having a little burning with urination today.  She continues to have trouble starting her urinary stream.  It stops and starts 3-4 times.  She reports her urine was amber last week and she saw a few spots of blood when wiping.  She said Dr. Alvy Bimler is wondering about radiation induced cystitis. She denies having vaginal/rectal bleeding.  She denies having any nausea.  She reports having a rash around her stoma site from her pectin dressing.    BP 105/61 mmHg  Pulse 66  Temp(Src) 97.7 F (36.5 C) (Oral)  Resp 12  Ht 5\' 2"  (1.575 m)  Wt 231 lb 14.4 oz (105.189 kg)  BMI 42.40 kg/m2

## 2015-02-25 NOTE — Progress Notes (Signed)
  Radiation Oncology         [phone] ________________________________  Name: [name] MRN: [id number]  Date: 02/25/2015  DOB: [birth year]  Weekly Radiation Therapy Management     ICD-9-CM ICD-10-CM   1. 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 s/p resection/partial vaginectomy/ LAR/colostomy 11/19/2014 789.30 R19.00 Urinalysis, Microscopic - [facility]    Current Dose: 34.2 Gy     Planned Dose:  50.4 Gy  Narrative . . . . . . . . The patient presents for routine under treatment assessment.                                   She reports pain due to abdominal cramping that started over the weekend. She is rating the pain at a 6/10. Dr. Alvy Bimler prescribed lomotil yesterday to see if it would help the cramping. She reports her stool becomes more formed over the weekend and then turned into diarrhea on Monday. She is taking oxycodone 5 mg 2-3 times a day. She has requested a refill. She reports having a little burning with urination today. She continues to have trouble starting her urinary stream. It stops and starts 3-4 times. She reports her urine was amber last week and she saw a few spots of blood when wiping. She said Dr. Alvy Bimler is wondering about radiation induced cystitis. She recently had urinary stints taken out. She denies having vaginal/rectal bleeding. She denies having any nausea. She reports having a rash around her stoma site from her pectin dressing. She inquired about being prescribed flovent to help with her stoma rash.                                 Set-up films were reviewed.                                 The chart was checked. Physical Findings. . .  height is 5\' 2"  (1.575 m) and weight is 231 lb 14.4 oz (105.189 kg). Her oral temperature is 97.7 F (36.5 C). Her blood pressure is 105/61 and her pulse is 66. Her respiration is 12. . The lungs are clear. The heart has a regular rhythm and rate. The abdomen is soft and nontender with normal bowel sounds. Impression . .  . . . . . The patient is tolerating radiation. Plan . . . . . . . . . . . . Continue treatment as planned. Prescribed the patient flovent to help her stoma rash. Patient reports that the ostomy nurse says this has been helpful for situation with pts at [facility]. Refilled her oxycodone 5 mg, 30 tablet prescription.  This document serves as a record of services personally performed by Gery Pray, MD. It was created on his behalf by Arlyce Harman, a trained medical scribe. The creation of this record is based on the scribe's personal observations and the provider's statements to them. This document has been checked and approved by the attending provider. ________________________________   Blair Promise, PhD, MD

## 2015-02-26 ENCOUNTER — Ambulatory Visit
Admission: RE | Admit: 2015-02-26 | Discharge: 2015-02-26 | Disposition: A | Discharge status: Home / Self Care | Payer: BLUE CROSS/BLUE SHIELD | Source: Ambulatory Visit | Attending: Radiation Oncology | Admitting: Radiation Oncology

## 2015-02-26 ENCOUNTER — Telehealth: Payer: Self-pay | Admitting: Oncology

## 2015-02-26 DIAGNOSIS — Z510 Encounter for antineoplastic radiation therapy: Secondary | ICD-10-CM | POA: Diagnosis not present

## 2015-02-26 NOTE — Telephone Encounter (Signed)
Called in prescription for fluticasone (FLOVENT HFA) 110 MCG/ACT inhaler -Inhale 1 puff into the lungs 2 (two) times daily. - Inhalation, disp. 1 inhaler, 0 refills per Dr. Sondra Come to [facility].

## 2015-02-27 ENCOUNTER — Ambulatory Visit (HOSPITAL_BASED_OUTPATIENT_CLINIC_OR_DEPARTMENT_OTHER): Payer: BLUE CROSS/BLUE SHIELD

## 2015-02-27 ENCOUNTER — Telehealth: Payer: Self-pay | Admitting: Oncology

## 2015-02-27 ENCOUNTER — Telehealth: Payer: Self-pay | Admitting: *Deleted

## 2015-02-27 ENCOUNTER — Ambulatory Visit: Payer: BLUE CROSS/BLUE SHIELD | Attending: Radiation Oncology

## 2015-02-27 ENCOUNTER — Other Ambulatory Visit: Payer: Self-pay | Admitting: Hematology

## 2015-02-27 ENCOUNTER — Ambulatory Visit
Admission: RE | Admit: 2015-02-27 | Discharge: 2015-02-27 | Disposition: A | Discharge status: Home / Self Care | Payer: BLUE CROSS/BLUE SHIELD | Source: Ambulatory Visit | Attending: Radiation Oncology | Admitting: Radiation Oncology

## 2015-02-27 ENCOUNTER — Other Ambulatory Visit (HOSPITAL_BASED_OUTPATIENT_CLINIC_OR_DEPARTMENT_OTHER): Payer: BLUE CROSS/BLUE SHIELD

## 2015-02-27 VITALS — BP 134/67 | HR 73 | Temp 97.6°F

## 2015-02-27 VITALS — BP 120/55 | HR 72 | Temp 98.2°F

## 2015-02-27 DIAGNOSIS — C549 Malignant neoplasm of corpus uteri, unspecified: Secondary | ICD-10-CM | POA: Diagnosis not present

## 2015-02-27 DIAGNOSIS — D709 Neutropenia, unspecified: Secondary | ICD-10-CM

## 2015-02-27 DIAGNOSIS — R1900 Intra-abdominal and pelvic swelling, mass and lump, unspecified site: Secondary | ICD-10-CM

## 2015-02-27 DIAGNOSIS — C709 Malignant neoplasm of meninges, unspecified: Secondary | ICD-10-CM

## 2015-02-27 DIAGNOSIS — Z510 Encounter for antineoplastic radiation therapy: Secondary | ICD-10-CM | POA: Diagnosis not present

## 2015-02-27 LAB — URINALYSIS, MICROSCOPIC - CHCC
Bilirubin (Urine): NEGATIVE
Glucose: 2000 mg/dL
KETONES: NEGATIVE mg/dL
Leukocyte Esterase: NEGATIVE
Nitrite: NEGATIVE
Protein: 30 mg/dL
Specific Gravity, Urine: 1.015 (ref 1.003–1.035)
UROBILINOGEN UR: 0.2 mg/dL (ref 0.2–1)
pH: 5 (ref 4.6–8.0)

## 2015-02-27 LAB — CBC & DIFF AND RETIC
BASO%: 2.5 % — ABNORMAL HIGH (ref 0.0–2.0)
BASOS ABS: 0 10*3/uL (ref 0.0–0.1)
EOS%: 29.4 % — ABNORMAL HIGH (ref 0.0–7.0)
Eosinophils Absolute: 0.5 10*3/uL (ref 0.0–0.5)
HCT: 32.3 % — ABNORMAL LOW (ref 34.8–46.6)
HEMOGLOBIN: 10.2 g/dL — AB (ref 11.6–15.9)
Immature Retic Fract: 20.1 % — ABNORMAL HIGH (ref 1.60–10.00)
LYMPH%: 21.5 % (ref 14.0–49.7)
MCH: 26.9 pg (ref 25.1–34.0)
MCHC: 31.6 g/dL (ref 31.5–36.0)
MCV: 85.2 fL (ref 79.5–101.0)
MONO#: 0.4 10*3/uL (ref 0.1–0.9)
MONO%: 25.8 % — ABNORMAL HIGH (ref 0.0–14.0)
NEUT#: 0.3 10*3/uL — CL (ref 1.5–6.5)
NEUT%: 20.8 % — AB (ref 38.4–76.8)
Platelets: 186 10*3/uL (ref 145–400)
RBC: 3.79 10*6/uL (ref 3.70–5.45)
RDW: 17.5 % — ABNORMAL HIGH (ref 11.2–14.5)
RETIC CT ABS: 73.15 10*3/uL (ref 33.70–90.70)
Retic %: 1.93 % (ref 0.70–2.10)
WBC: 1.6 10*3/uL — ABNORMAL LOW (ref 3.9–10.3)
lymph#: 0.4 10*3/uL — ABNORMAL LOW (ref 0.9–3.3)
nRBC: 0 % (ref 0–0)

## 2015-02-27 MED ORDER — TBO-FILGRASTIM 480 MCG/0.8ML ~~LOC~~ SOSY
480.0000 ug | PREFILLED_SYRINGE | SUBCUTANEOUS | Status: DC
  Administered 2015-02-27: 480 ug via SUBCUTANEOUS
  Filled 2015-02-27: qty 0.8

## 2015-02-27 NOTE — Telephone Encounter (Signed)
CALLED PATIENT TO INFORM OF CT ON 03-03-15, SPOKE WITH PATIENT AND SHE IS AWARE OF THIS TEST

## 2015-02-27 NOTE — Telephone Encounter (Signed)
Pt wants to make sure Dr. Alvy Bimler aware  last day of XRT extended to Monday 8/8.   Scheduled for Lab/Dr. Alvy Bimler on 8/11.  Pt asks if Dr. Alvy Bimler wants to see her so soon after XRT complete or she thinks Dr. Alvy Bimler may have told her she wanted to wait one week after XRT completed to re check labs and see her?

## 2015-02-27 NOTE — Progress Notes (Addendum)
[name] stopped by the clinic and said she had increased pain in flanks and bladder when lying on the treatment table and when rising from a sitting position.  She is taking oxycodone which she reports is helping with the pain.  She reports she is urinating but is not sure she is emptying her bladder.  She is wondering if she should have a repeat urinalysis and culture.  Dr. Sondra Come has been advised of the patient's symptoms.  Urinalysis and culture ordered per Dr. Sondra Come.

## 2015-02-27 NOTE — Telephone Encounter (Addendum)
Called in prescription for pyridium to [facility] per Dr. Sondra Come.  [place] and advised her that pyridium has been called in and that Dr. Sondra Come has ordered a CT scan of her abdomen and pelvis for Monday.[name] verbalized agreement.

## 2015-02-27 NOTE — Telephone Encounter (Signed)
She will need significant changes to her schedule.   I will tell the scheduler the following: Move labs and inj from 8/3 to 8/4 to keep in on Thursdays Keep labs and inj on 8/11  Cancel appointment to see me on 8/11 Add labs, see me at 315 pm and inj afterwards on 8/18

## 2015-02-27 NOTE — Telephone Encounter (Signed)
Informed pt of Dr. Calton Dach message below.  Expect a call from [place] w/ new dates/ times.  She verbalized understanding.

## 2015-02-28 ENCOUNTER — Telehealth: Payer: Self-pay | Admitting: Hematology and Oncology

## 2015-02-28 ENCOUNTER — Ambulatory Visit
Admission: RE | Admit: 2015-02-28 | Discharge: 2015-02-28 | Disposition: A | Discharge status: Home / Self Care | Payer: BLUE CROSS/BLUE SHIELD | Source: Ambulatory Visit | Attending: Radiation Oncology | Admitting: Radiation Oncology

## 2015-02-28 DIAGNOSIS — Z510 Encounter for antineoplastic radiation therapy: Secondary | ICD-10-CM | POA: Diagnosis not present

## 2015-02-28 LAB — URINE CULTURE

## 2015-02-28 NOTE — Telephone Encounter (Signed)
s.w pt and advised on changed appt....pt is comming in today to get new sched

## 2015-03-03 ENCOUNTER — Ambulatory Visit (HOSPITAL_COMMUNITY): Payer: BLUE CROSS/BLUE SHIELD

## 2015-03-03 ENCOUNTER — Ambulatory Visit
Admission: RE | Admit: 2015-03-03 | Discharge: 2015-03-03 | Disposition: A | Discharge status: Home / Self Care | Payer: BLUE CROSS/BLUE SHIELD | Source: Ambulatory Visit | Attending: Radiation Oncology | Admitting: Radiation Oncology

## 2015-03-03 ENCOUNTER — Encounter (HOSPITAL_COMMUNITY): Payer: Self-pay

## 2015-03-03 ENCOUNTER — Encounter: Payer: Self-pay | Admitting: Radiation Oncology

## 2015-03-03 ENCOUNTER — Ambulatory Visit (HOSPITAL_COMMUNITY)
Admission: RE | Admit: 2015-03-03 | Discharge: 2015-03-03 | Disposition: A | Discharge status: Home / Self Care | Payer: BLUE CROSS/BLUE SHIELD | Source: Ambulatory Visit | Attending: Radiation Oncology | Admitting: Radiation Oncology

## 2015-03-03 VITALS — BP 125/66 | HR 68 | Temp 97.7°F | Resp 16

## 2015-03-03 DIAGNOSIS — C541 Malignant neoplasm of endometrium: Secondary | ICD-10-CM | POA: Insufficient documentation

## 2015-03-03 DIAGNOSIS — Z933 Colostomy status: Secondary | ICD-10-CM | POA: Diagnosis not present

## 2015-03-03 DIAGNOSIS — M545 Low back pain: Secondary | ICD-10-CM | POA: Diagnosis not present

## 2015-03-03 DIAGNOSIS — Z90710 Acquired absence of both cervix and uterus: Secondary | ICD-10-CM | POA: Diagnosis not present

## 2015-03-03 DIAGNOSIS — C569 Malignant neoplasm of unspecified ovary: Secondary | ICD-10-CM | POA: Diagnosis not present

## 2015-03-03 DIAGNOSIS — Z9049 Acquired absence of other specified parts of digestive tract: Secondary | ICD-10-CM | POA: Diagnosis not present

## 2015-03-03 DIAGNOSIS — N1330 Unspecified hydronephrosis: Secondary | ICD-10-CM | POA: Insufficient documentation

## 2015-03-03 DIAGNOSIS — R1900 Intra-abdominal and pelvic swelling, mass and lump, unspecified site: Secondary | ICD-10-CM | POA: Diagnosis present

## 2015-03-03 DIAGNOSIS — Z510 Encounter for antineoplastic radiation therapy: Secondary | ICD-10-CM | POA: Diagnosis not present

## 2015-03-03 DIAGNOSIS — C549 Malignant neoplasm of corpus uteri, unspecified: Secondary | ICD-10-CM

## 2015-03-03 DIAGNOSIS — K760 Fatty (change of) liver, not elsewhere classified: Secondary | ICD-10-CM | POA: Diagnosis not present

## 2015-03-03 MED ORDER — IOHEXOL 300 MG/ML  SOLN
100.0000 mL | Freq: Once | INTRAMUSCULAR | Status: AC | PRN
  Administered 2015-03-03: 100 mL via INTRAVENOUS

## 2015-03-03 NOTE — Progress Notes (Signed)
Department of Radiation Oncology  Phone:  [phone] Fax:        [phone]  Weekly Treatment Note    Name: [name] Date: 03/03/2015 MRN: [id number] DOB: [birth year]   Current dose: 41.4 Gy  Current fraction:23   MEDICATIONS: Current Outpatient Prescriptions  Medication Sig Dispense Refill  . Biotin 5 MG TABS Take by mouth.    . Calcium Carbonate-Vitamin D (CALCIUM + D PO) Take 2 tablets by mouth daily.    . Canagliflozin (INVOKANA) 300 MG TABS Take 1 tablet by mouth every morning.     . Cholecalciferol (VITAMIN D3) 10000 UNITS capsule Take 10,000 Units by mouth once a week. Sundays    . diphenoxylate-atropine (LOMOTIL) 2.5-0.025 MG per tablet Take 1 tablet by mouth 4 (four) times daily as needed for diarrhea or loose stools. 60 tablet 0  . hyaluronate sodium (RADIAPLEXRX) GEL Apply 1 application topically 2 (two) times daily.    [name] Kitchen levothyroxine (SYNTHROID) 175 MCG tablet Take 175 mcg by mouth daily before breakfast.    . loperamide (IMODIUM) 2 MG capsule Take by mouth as needed for diarrhea or loose stools.    . Multiple Vitamin (MULTIVITAMIN WITH MINERALS) TABS tablet Take 1 tablet by mouth daily.    . naproxen sodium (ANAPROX) 220 MG tablet Take 220 mg by mouth 2 (two) times daily with a meal.    . omega-3 acid ethyl esters (LOVAZA) 1 G capsule Take 1 g by mouth 2 (two) times daily.    [name] Kitchen omeprazole (PRILOSEC) 20 MG capsule Take 20 mg by mouth at bedtime.     Glory Rosebush VERIO test strip     . oxyCODONE (OXY IR/ROXICODONE) 5 MG immediate release tablet Take 1 tablet (5 mg total) by mouth every 4 (four) hours as needed for moderate pain or severe pain. 30 tablet 0  . Polyethyl Glycol-Propyl Glycol (SYSTANE OP) Apply 1 drop to eye daily as needed (Dry eyes).     . rosuvastatin (CRESTOR) 10 MG tablet Take 10 mg by mouth every evening.     . sitaGLIPtin (JANUVIA) 100 MG tablet Take 100 mg by mouth daily.    . Tbo-Filgrastim (GRANIX) 480 MCG/0.8ML SOSY injection Inject 480  mcg into the skin once a week. @[facility]     . valsartan (DIOVAN) 320 MG tablet Take 320 mg by mouth daily.    . Alum & Mag Hydroxide-Simeth (MAGIC MOUTHWASH) SOLN Take 5 mLs by mouth daily as needed for mouth pain (mouth pain).    . ciprofloxacin (CIPRO) 500 MG tablet TK 1 T PO  BID STARTING 3 DAYS BEFORE NEXT UROLOGY APPOINTMENT  0  . ferrous sulfate 325 (65 FE) MG tablet Take 1 tablet (325 mg total) by mouth 2 (two) times daily with a meal. (Patient not taking: Reported on 02/25/2015) 60 tablet 3  . fluticasone (FLOVENT HFA) 110 MCG/ACT inhaler Inhale 1 puff into the lungs 2 (two) times daily.    [name] Kitchen ibuprofen (ADVIL,MOTRIN) 800 MG tablet Take 800 mg by mouth every 8 (eight) hours as needed for moderate pain (pain).    . magnesium chloride (SLOW-MAG) 64 MG TBEC SR tablet Take 2 tablets (128 mg total) by mouth 2 (two) times daily. (Patient not taking: Reported on 02/25/2015) 120 tablet 0  . metFORMIN (GLUCOPHAGE) 1000 MG tablet Take 1,000 mg by mouth 2 (two) times daily with a meal.     . ondansetron (ZOFRAN) 4 MG tablet Take 1 tablet (4 mg total) by mouth every 6 (six) hours  as needed for nausea. (Patient not taking: Reported on 02/25/2015) 20 tablet 0  . phenazopyridine (PYRIDIUM) 200 MG tablet Take 200 mg by mouth 3 (three) times daily as needed for pain. Called in to [facility] per Dr. Sondra Come.  Disp. 30 tablets. 2 refills.    . potassium chloride SA (K-DUR,KLOR-CON) 20 MEQ tablet Take 1 tablet (20 mEq total) by mouth daily. (Patient not taking: Reported on 02/25/2015) 30 tablet 0  . saccharomyces boulardii (FLORASTOR) 250 MG capsule Take 1 capsule (250 mg total) by mouth 2 (two) times daily. (Patient not taking: Reported on 02/25/2015) 60 capsule 2   No current facility-administered medications for this encounter.   Facility-Administered Medications Ordered in Other Encounters  Medication Dose Route Frequency Provider Last Rate Last Dose  . Tbo-Filgrastim (GRANIX) injection 480 mcg  480 mcg Subcutaneous  Weekly Heath Lark, MD   480 mcg at 11/29/14 1338     ALLERGIES: Penicillins; Ultram; Adhesive; Cefaclor; Erythromycin; Trimethoprim; and Sulfa antibiotics   LABORATORY DATA:  Lab Results  Component Value Date   WBC 1.6* 02/27/2015   HGB 10.2* 02/27/2015   HCT 32.3* 02/27/2015   MCV 85.2 02/27/2015   PLT 186 02/27/2015   Lab Results  Component Value Date   NA 141 01/06/2015   K 4.0 01/06/2015   CL 105 01/06/2015   CO2 24 01/06/2015   Lab Results  Component Value Date   ALT 12* 12/31/2014   AST 11* 12/31/2014   ALKPHOS 57 12/31/2014   BILITOT 0.6 12/31/2014     NARRATIVE: [name] was seen today for weekly treatment management. The chart was checked and the patient's films were reviewed.  [name] has completed 23 fractions to her pelvis.  She has developed a red rash over her chest and both arms that started on Saturday.  She reports that the rash is itching.  She started taking lomotil late last week and is wondering if that can be the cause.  She took benadryl once yesterday.  She is currently taking Imodium 6x day.   PHYSICAL EXAMINATION: oral temperature is 97.7 F (36.5 C). Her blood pressure is 125/66 and her pulse is 68. Her respiration is 16 and oxygen saturation is 99%.       the patient has a rash in the anterior upper chest extending to the upper extremities bilaterally.  ASSESSMENT: The patient is doing satisfactorily with treatment. She has a rash suggestive of a allergic reaction. She is unaware of any changes except for the addition of Lomotil.  PLAN: We will continue with the patient's radiation treatment as planned. Advised to take up to 8 x Imodium and discontinue Lomotil use. Also, take scheduled Benedryl and treat rash area with hydrocortisone cream. ------------------------------------------------  Jodelle Gross, MD, PhD  This document serves as a record of services personally performed by Kyung Rudd, MD. It was created on his behalf by Derek Mound, a trained medical scribe. The creation of this record is based on the scribe's personal observations and the provider's statements to them. This document has been checked and approved by the attending provider.

## 2015-03-03 NOTE — Progress Notes (Signed)
[name] has completed 23 fractions to her pelvis.  She has developed a red rash over her chest and both arms that started on Saturday.  She reports that the rash is itching.  She started taking lomotil late last week and is wondering if that can be the cause.  She took benadryl once yesterday.  BP 125/66 mmHg  Pulse 68  Temp(Src) 97.7 F (36.5 C) (Oral)  Resp 16  SpO2 99%

## 2015-03-04 ENCOUNTER — Ambulatory Visit
Admission: RE | Admit: 2015-03-04 | Discharge: 2015-03-04 | Disposition: A | Discharge status: Home / Self Care | Payer: BLUE CROSS/BLUE SHIELD | Source: Ambulatory Visit | Attending: Radiation Oncology | Admitting: Radiation Oncology

## 2015-03-04 ENCOUNTER — Telehealth: Payer: Self-pay | Admitting: Oncology

## 2015-03-04 ENCOUNTER — Encounter: Payer: Self-pay | Admitting: *Deleted

## 2015-03-04 ENCOUNTER — Encounter: Payer: Self-pay | Admitting: Nurse Practitioner

## 2015-03-04 VITALS — BP 118/81 | HR 73 | Temp 97.8°F | Resp 16 | Ht 62.0 in | Wt 231.9 lb

## 2015-03-04 DIAGNOSIS — Z510 Encounter for antineoplastic radiation therapy: Secondary | ICD-10-CM | POA: Diagnosis not present

## 2015-03-04 DIAGNOSIS — R1900 Intra-abdominal and pelvic swelling, mass and lump, unspecified site: Secondary | ICD-10-CM

## 2015-03-04 NOTE — Progress Notes (Signed)
Weekly Management Note Current Dose:43.2 Gy  Projected Dose: 50.4 Gy   Narrative:  The patient presents for routine under treatment assessment.  CBCT/MVCT images/Port film x-rays were reviewed.  The chart was checked. Increasing back and suprapubic pain. She had a CT which showed increasing hydronephrosis. She feels this gets better during the weekend when she is not undergoing radiation. Pain controlled with Oxycodone 1-2 times a day and Aleve. She is scheduled to see a NP and neurology tomorrow. Denies nausea and diarrhea. Does have difficulty initiating urine and feels as though she is not urinating enough. Had a repeat urinalysis negative, except for blood.  Physical Findings: Pleasant and in no distress.  Vitals:  Filed Vitals:   03/04/15 1458  BP: 118/81  Pulse: 73  Temp: 97.8 F (36.6 C)  Resp: 16   Weight:  Wt Readings from Last 3 Encounters:  03/04/15 231 lb 14.4 oz (105.189 kg)  02/25/15 231 lb 14.4 oz (105.189 kg)  02/20/15 223 lb 14.4 oz (101.56 kg)   Lab Results  Component Value Date   WBC 1.6* 02/27/2015   HGB 10.2* 02/27/2015   HCT 32.3* 02/27/2015   MCV 85.2 02/27/2015   PLT 186 02/27/2015   Lab Results  Component Value Date   CREATININE 0.8 01/22/2015   BUN 14.1 01/22/2015   NA 141 01/06/2015   K 4.0 01/06/2015   CL 105 01/06/2015   CO2 24 01/06/2015     Impression:  The patient is tolerating radiation.  Plan:  Continue treatment as planned. Discuss her CT with Dr. Tresa Moore and Joylene John. It may be her urinary symptoms may be worse due to the acute effects of radiation. The hydronephrosis is worrisome, however,. I discussed with her that this may be her new normal and discussing this with her GYN ONC and with Dr. Tresa Moore as to what she can expect moving forward would be important. I will also check a creatinine.   This document serves as a record of services personally performed by Thea Silversmith, MD. It was created on her behalf by Darcus Austin, a  trained medical scribe. The creation of this record is based on the scribe's personal observations and the provider's statements to them. This document has been checked and approved by the attending provider.

## 2015-03-04 NOTE — Progress Notes (Addendum)
[name] has completed 24 fractions to her endometrium.  She continues to have pain in her suprapubic area at a 3/10.  She is taking oxycodone 1-2 times a day and aleve.  She is concerned about her CT results from Monday.  She is going to see the Nurse Practitioner at [facility] tomorrow.  She is also concerned about her urine culture results and is wondering if she needs an antibiotic.  She continues to have a rash on her arms and chest.  She is taking benadryl every 4 hours and is using hydrocortisone cream.  She reports having formed stool in her colostomy bag today and has not taken imodium or lomotil.  She denies having any skin irritation in the treatment area.  She denies having any vaginal/rectal bleeding. She reports fatigue.  BP 118/81 mmHg  Pulse 73  Temp(Src) 97.8 F (36.6 C) (Oral)  Resp 16  Ht 5\' 2"  (1.575 m)  Wt 231 lb 14.4 oz (105.189 kg)  BMI 42.40 kg/m2

## 2015-03-04 NOTE — Telephone Encounter (Signed)
[facility] and spoke to the triage nurse.  Asked which doctor Morgin has seen in the past.  Per the nurse, she has seen Dr. Tresa Moore.  Explained that Dr. Sondra Come would like Dr. Tresa Moore to see the patient because of her CT Scan results from 03/03/15 showing a worsening right hydroureteronephrosis.  The nurse would like [place] to fax the CT results to [facility] and she will have Dr. Tresa Moore review them.  They will call Stefana if needed with any appointment.   Verbalized agreement and faxed the results to [facility].

## 2015-03-05 ENCOUNTER — Ambulatory Visit: Payer: BLUE CROSS/BLUE SHIELD

## 2015-03-05 ENCOUNTER — Ambulatory Visit
Admission: RE | Admit: 2015-03-05 | Discharge: 2015-03-05 | Disposition: A | Discharge status: Home / Self Care | Payer: BLUE CROSS/BLUE SHIELD | Source: Ambulatory Visit | Attending: Radiation Oncology | Admitting: Radiation Oncology

## 2015-03-05 ENCOUNTER — Other Ambulatory Visit: Payer: BLUE CROSS/BLUE SHIELD

## 2015-03-05 DIAGNOSIS — Z510 Encounter for antineoplastic radiation therapy: Secondary | ICD-10-CM | POA: Diagnosis not present

## 2015-03-06 ENCOUNTER — Ambulatory Visit (HOSPITAL_BASED_OUTPATIENT_CLINIC_OR_DEPARTMENT_OTHER): Payer: BLUE CROSS/BLUE SHIELD | Admitting: Hematology and Oncology

## 2015-03-06 ENCOUNTER — Other Ambulatory Visit (HOSPITAL_BASED_OUTPATIENT_CLINIC_OR_DEPARTMENT_OTHER): Payer: BLUE CROSS/BLUE SHIELD

## 2015-03-06 ENCOUNTER — Ambulatory Visit: Payer: BLUE CROSS/BLUE SHIELD

## 2015-03-06 ENCOUNTER — Ambulatory Visit
Admission: RE | Admit: 2015-03-06 | Discharge: 2015-03-06 | Disposition: A | Discharge status: Home / Self Care | Payer: BLUE CROSS/BLUE SHIELD | Source: Ambulatory Visit | Attending: Radiation Oncology | Admitting: Radiation Oncology

## 2015-03-06 VITALS — BP 120/56 | HR 68 | Temp 97.8°F | Resp 24 | Ht 62.0 in | Wt 234.1 lb

## 2015-03-06 DIAGNOSIS — R21 Rash and other nonspecific skin eruption: Secondary | ICD-10-CM | POA: Diagnosis not present

## 2015-03-06 DIAGNOSIS — D709 Neutropenia, unspecified: Secondary | ICD-10-CM

## 2015-03-06 DIAGNOSIS — Z510 Encounter for antineoplastic radiation therapy: Secondary | ICD-10-CM | POA: Diagnosis not present

## 2015-03-06 DIAGNOSIS — D630 Anemia in neoplastic disease: Secondary | ICD-10-CM

## 2015-03-06 DIAGNOSIS — R1900 Intra-abdominal and pelvic swelling, mass and lump, unspecified site: Secondary | ICD-10-CM

## 2015-03-06 LAB — CBC & DIFF AND RETIC
BASO%: 0.8 % (ref 0.0–2.0)
Basophils Absolute: 0 10*3/uL (ref 0.0–0.1)
EOS%: 34.7 % — ABNORMAL HIGH (ref 0.0–7.0)
Eosinophils Absolute: 0.4 10*3/uL (ref 0.0–0.5)
HEMATOCRIT: 33.1 % — AB (ref 34.8–46.6)
HGB: 10.3 g/dL — ABNORMAL LOW (ref 11.6–15.9)
Immature Retic Fract: 12.6 % — ABNORMAL HIGH (ref 1.60–10.00)
LYMPH#: 0.3 10*3/uL — AB (ref 0.9–3.3)
LYMPH%: 21.5 % (ref 14.0–49.7)
MCH: 27.1 pg (ref 25.1–34.0)
MCHC: 31.1 g/dL — AB (ref 31.5–36.0)
MCV: 87.1 fL (ref 79.5–101.0)
MONO#: 0.4 10*3/uL (ref 0.1–0.9)
MONO%: 33.1 % — ABNORMAL HIGH (ref 0.0–14.0)
NEUT%: 9.9 % — ABNORMAL LOW (ref 38.4–76.8)
NEUTROS ABS: 0.1 10*3/uL — AB (ref 1.5–6.5)
Platelets: 240 10*3/uL (ref 145–400)
RBC: 3.8 10*6/uL (ref 3.70–5.45)
RDW: 18.5 % — ABNORMAL HIGH (ref 11.2–14.5)
RETIC %: 2.19 % — AB (ref 0.70–2.10)
Retic Ct Abs: 83.22 10*3/uL (ref 33.70–90.70)
WBC: 1.2 10*3/uL — ABNORMAL LOW (ref 3.9–10.3)

## 2015-03-06 LAB — BASIC METABOLIC PANEL (CC13)
Anion Gap: 7 mEq/L (ref 3–11)
BUN: 18.4 mg/dL (ref 7.0–26.0)
CALCIUM: 8.9 mg/dL (ref 8.4–10.4)
CO2: 29 meq/L (ref 22–29)
Chloride: 106 mEq/L (ref 98–109)
Creatinine: 0.9 mg/dL (ref 0.6–1.1)
EGFR: 68 mL/min/{1.73_m2} — ABNORMAL LOW (ref >90)
GLUCOSE: 139 mg/dL (ref 70–140)
Potassium: 3.8 mEq/L (ref 3.5–5.1)
SODIUM: 142 meq/L (ref 136–145)

## 2015-03-06 MED ORDER — TBO-FILGRASTIM 480 MCG/0.8ML ~~LOC~~ SOSY
480.0000 ug | PREFILLED_SYRINGE | SUBCUTANEOUS | Status: DC
  Administered 2015-03-06: 480 ug via SUBCUTANEOUS
  Filled 2015-03-06: qty 0.8

## 2015-03-06 NOTE — Telephone Encounter (Signed)
Entered in error

## 2015-03-06 NOTE — Assessment & Plan Note (Signed)
She has skin rash and eosinophilia. I suspect it started with contact dermatitis around the ostomy site.  I recommend she continue around-the-clock Benadryl, round-the-clock topical steroids cream along with Claritin. If it does not get better by next week, we will start her on prednisone therapy.

## 2015-03-06 NOTE — Assessment & Plan Note (Signed)
This is likely anemia of chronic disease. The patient denies recent history of bleeding such as epistaxis, hematuria or hematochezia. She is asymptomatic from the anemia. We will observe for now.  She does not require transfusion now.   

## 2015-03-06 NOTE — Progress Notes (Signed)
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 Going to the patient, initial diagnosis with ovarian cancer was when she presented with abdomen and no swelling and pelvic pain leading to her urgent care visit. She states that the tumor had caused torsion of the ovary. She cannot remember the stage of her disease. She began to develop leukopenia in approximately July of 2010. She underwent an initial bone marrow biopsy at [facility] in [place] on 03/17/2009. but it was nondiagnostic. No gross dysplastic changes, no excess blasts, normal cytogenetics.  Her counts have been stable over observation through this office since December 2010 with some minor fluctuations. Total white counts run as low as 1800 and as high as 2900. Percent neutrophils fluctuates very widely from as low as 4% recorded on a CBC done 02/03/2010 to as high as 31% with average being 20%. She was hospitalized in the ICU at that time  Both hemoglobin and platelets have remained stable over time.  Despite leukopenia, she has not had any recurrent or severe infections. She stated strong family history of lupus in a cousin. She complained of occasional skin rash and joint stiffness. 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 She received G-CSF in January of 2015  to stimulate white blood cell production around a colonoscopy procedure a few years ago. She had a limited response. On 01/23/2014, repeat bone marrow biopsy excluded lymphoma or leukemia. On 09/12/2014, CT scan of the abdomen and pelvis show possible relapse of gynecological Malignancy On 10/14/2014, MRI of the pelvis show significant pelvic mass without invasion into the bladder but abutting to the rectum On 11/19/2014, she underwent surgery and had robotic-assisted lysis of adhesions, converted to laparotomy, radical upper vaginectomy and low anterior resection with colostomy. Bilateral ureteral stent placement and cystotomy repair. 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 Resection margins were negative. She was hospitalized from 11/19/2014 to 11/25/2014. In May, showed recurrent admission to the hospital due to complication with infection. On 01/29/2015, she began adjuvant radiation treatment On 03/03/2015, CT scan of the abdomen and pelvis showed status post interval removal of the bilateral nephroureteral stents. Worsening moderate right hydroureteronephrosis. Resolved left hydroureteronephrosis.  INTERVAL HISTORY: Please see below for problem oriented charting. She is seen urgently because of diffuse skin rash. It started several weeks ago when she developed contact dermatitis around her ostomy site. Over the past week, she started to have outbreak on the skin around the neck and shoulder area. It is diffusely itchy. She have intermittent pelvic discomfort and is taking oxycodone for pain. She has CT scan done recently and urology consult is pending.  REVIEW OF SYSTEMS:   Constitutional: Denies fevers, chills or abnormal weight loss Eyes: Denies blurriness of vision Ears, nose, mouth, throat, and face: Denies mucositis or sore throat Respiratory: Denies cough, dyspnea or  wheezes Cardiovascular: Denies palpitation, chest discomfort or lower extremity swelling Gastrointestinal:  Denies nausea, heartburn or change in bowel habits Lymphatics: Denies new lymphadenopathy or easy bruising Neurological:Denies numbness, tingling or new weaknesses Behavioral/Psych: Mood is stable, no new changes  All other systems were reviewed with the patient and are negative.  I have reviewed the past medical history, past surgical history, social history and family history with the patient and they are unchanged from previous note.  ALLERGIES:  is allergic to penicillins; ultram; adhesive; cefaclor; erythromycin; trimethoprim; and sulfa antibiotics.  MEDICATIONS:  Current Outpatient Prescriptions  Medication Sig Dispense Refill  . Alum & Mag Hydroxide-Simeth (MAGIC MOUTHWASH) SOLN Take 5 mLs by mouth daily as needed for mouth pain (mouth pain).    . Biotin 5 MG TABS Take by mouth.    . Calcium Carbonate-Vitamin D (CALCIUM + D PO) Take 2 tablets by mouth daily.    . Canagliflozin (INVOKANA) 300 MG TABS Take 1 tablet by mouth every morning.     . Cholecalciferol (VITAMIN D3) 10000 UNITS capsule Take 10,000 Units by mouth once a week. Sundays    . diphenhydrAMINE (BENADRYL) 25 MG tablet Take 25 mg by mouth every 4 (four) hours as needed.    . diphenoxylate-atropine (LOMOTIL) 2.5-0.025 MG per tablet Take 1 tablet by mouth 4 (four) times daily as needed for diarrhea or loose stools. 60 tablet 0  . ferrous sulfate 325 (65 FE) MG tablet Take 1 tablet (325 mg total) by mouth 2 (two) times daily with a meal. 60 tablet 3  . fluticasone (FLOVENT HFA) 110 MCG/ACT inhaler Inhale 1 puff into the lungs 2 (two) times daily.    . hyaluronate sodium (RADIAPLEXRX) GEL Apply 1 application topically 2 (two) times daily.    [name] Kitchen ibuprofen (ADVIL,MOTRIN) 800 MG tablet Take 800 mg by mouth every 8 (eight) hours as needed for moderate pain (pain).    [name] Kitchen levothyroxine (SYNTHROID) 175 MCG tablet Take 175 mcg  by mouth daily before breakfast.    . loperamide (IMODIUM) 2 MG capsule Take by mouth as needed for diarrhea or loose stools.    . magnesium chloride (SLOW-MAG) 64 MG TBEC SR tablet Take 2 tablets (128 mg total) by mouth 2 (two) times daily. 120 tablet 0  . metFORMIN (GLUCOPHAGE) 1000 MG tablet Take 1,000 mg by mouth 2 (two) times daily with a meal.     . Multiple Vitamin (MULTIVITAMIN WITH MINERALS) TABS tablet Take 1 tablet by mouth daily.    . naproxen sodium (ANAPROX) 220 MG tablet Take 220 mg by mouth 2 (two) times daily with a meal.    . omega-3 acid ethyl esters (LOVAZA) 1 G capsule Take 1 g by mouth 2 (two) times daily.    [name] Kitchen omeprazole (PRILOSEC) 20 MG capsule Take 20 mg by mouth at bedtime.     . ondansetron (ZOFRAN) 4 MG tablet Take 1 tablet (4 mg total) by mouth every 6 (six) hours as needed for nausea. 20 tablet 0  . ONETOUCH VERIO test strip     . oxyCODONE (OXY IR/ROXICODONE) 5 MG immediate release tablet Take 1 tablet (5 mg total) by mouth every 4 (four) hours as needed for moderate pain or severe pain. 30 tablet 0  . Polyethyl Glycol-Propyl Glycol (SYSTANE OP) Apply 1 drop to eye daily as needed (Dry eyes).     . potassium chloride SA (K-DUR,KLOR-CON) 20 MEQ tablet Take 1 tablet (20 mEq total) by mouth daily. 30 tablet 0  . rosuvastatin (CRESTOR) 10 MG tablet Take 10 mg by mouth every evening.     [name] Kitchen  saccharomyces boulardii (FLORASTOR) 250 MG capsule Take 1 capsule (250 mg total) by mouth 2 (two) times daily. 60 capsule 2  . sitaGLIPtin (JANUVIA) 100 MG tablet Take 100 mg by mouth daily.    . Tbo-Filgrastim (GRANIX) 480 MCG/0.8ML SOSY injection Inject 480 mcg into the skin once a week. @[facility]     . valsartan (DIOVAN) 320 MG tablet Take 320 mg by mouth daily.    . phenazopyridine (PYRIDIUM) 200 MG tablet Take 200 mg by mouth 3 (three) times daily as needed for pain. Called in to [facility] per Dr. Sondra Come.  Disp. 30 tablets. 2 refills.     No current facility-administered medications  for this visit.   Facility-Administered Medications Ordered in Other Visits  Medication Dose Route Frequency Provider Last Rate Last Dose  . Tbo-Filgrastim (GRANIX) injection 480 mcg  480 mcg Subcutaneous Weekly Heath Lark, MD   480 mcg at 11/29/14 1338  . Tbo-Filgrastim (GRANIX) injection 480 mcg  480 mcg Subcutaneous Weekly Heath Lark, MD   480 mcg at 03/06/15 1436    PHYSICAL EXAMINATION: ECOG PERFORMANCE STATUS: 1 - Symptomatic but completely ambulatory  Filed Vitals:   03/06/15 1434  BP: 120/56  Pulse: 68  Temp: 97.8 F (36.6 C)  Resp: 24   Filed Weights   03/06/15 1434  Weight: 234 lb 1.6 oz (106.187 kg)    GENERAL:alert, no distress and comfortable SKIN:  She has diffuse maculopapular rash resembled dermatitis. EYES: normal, Conjunctiva are pink and non-injected, sclera clear OROPHARYNX:no exudate, no erythema and lips, buccal mucosa, and tongue normal  Musculoskeletal:no cyanosis of digits and no clubbing  NEURO: alert & oriented x 3 with fluent speech, no focal motor/sensory deficits  LABORATORY DATA:  I have reviewed the data as listed    Component Value Date/Time   NA 142 03/06/2015 1340   NA 141 01/06/2015 1610   K 3.8 03/06/2015 1340   K 4.0 01/06/2015 1610   CL 105 01/06/2015 1610   CO2 29 03/06/2015 1340   CO2 24 01/06/2015 1610   GLUCOSE 139 03/06/2015 1340   GLUCOSE 126* 01/06/2015 1610   BUN 18.4 03/06/2015 1340   BUN 17 01/06/2015 1610   CREATININE 0.9 03/06/2015 1340   CREATININE 0.80 01/06/2015 1610   CREATININE 0.63 01/02/2015 0400   CALCIUM 8.9 03/06/2015 1340   CALCIUM 9.6 01/06/2015 1610   PROT 6.7 12/31/2014 0638   PROT 6.4 11/29/2014 1251   ALBUMIN 2.8* 12/31/2014 0638   ALBUMIN 2.6* 11/29/2014 1251   AST 11* 12/31/2014 0638   AST 15 11/29/2014 1251   ALT 12* 12/31/2014 0638   ALT 17 11/29/2014 1251   ALKPHOS 57 12/31/2014 0638   ALKPHOS 77 11/29/2014 1251   BILITOT 0.6 12/31/2014 0638   BILITOT 0.34 11/29/2014 1251   GFRNONAA  >60 01/02/2015 0400   GFRNONAA 78 12/16/2014 1530   GFRAA >60 01/02/2015 0400   GFRAA >89 12/16/2014 1530    No results found for: SPEP, UPEP  Lab Results  Component Value Date   WBC 1.2* 03/06/2015   NEUTROABS 0.1* 03/06/2015   HGB 10.3* 03/06/2015   HCT 33.1* 03/06/2015   MCV 87.1 03/06/2015   PLT 240 03/06/2015      Chemistry      Component Value Date/Time   NA 142 03/06/2015 1340   NA 141 01/06/2015 1610   K 3.8 03/06/2015 1340   K 4.0 01/06/2015 1610   CL 105 01/06/2015 1610   CO2 29 03/06/2015 1340   CO2 24 01/06/2015  1610   BUN 18.4 03/06/2015 1340   BUN 17 01/06/2015 1610   CREATININE 0.9 03/06/2015 1340   CREATININE 0.80 01/06/2015 1610   CREATININE 0.63 01/02/2015 0400      Component Value Date/Time   CALCIUM 8.9 03/06/2015 1340   CALCIUM 9.6 01/06/2015 1610   ALKPHOS 57 12/31/2014 0638   ALKPHOS 77 11/29/2014 1251   AST 11* 12/31/2014 0638   AST 15 11/29/2014 1251   ALT 12* 12/31/2014 0638   ALT 17 11/29/2014 1251   BILITOT 0.6 12/31/2014 0638   BILITOT 0.34 11/29/2014 1251      ASSESSMENT & PLAN:  Chronic neutropenia The patient responded well to G-CSF. There is no role for IVIG. I recommend she treat returns on a weekly basis for G-CSF injection to keep her Galt greater than 1500. Her ANC is getting lower due to side effects of radiation. Thankfully, she has no infectious complication. I will continue once a week for now until her radiation is completed. If she start to develop infectious complication, I might consider increasing G-CSF to twice a week.   Anemia in neoplastic disease This is likely anemia of chronic disease. The patient denies recent history of bleeding such as epistaxis, hematuria or hematochezia. She is asymptomatic from the anemia. We will observe for now.  She does not require transfusion now.      Rash, skin  She has skin rash and eosinophilia. I suspect it started with contact dermatitis around the ostomy site.  I  recommend she continue around-the-clock Benadryl, round-the-clock topical steroids cream along with Claritin. If it does not get better by next week, we will start her on prednisone therapy.   No orders of the defined types were placed in this encounter.   All questions were answered. The patient knows to call the clinic with any problems, questions or concerns. No barriers to learning was detected. I spent 15 minutes counseling the patient face to face. The total time spent in the appointment was 20 minutes and more than 50% was on counseling and review of test results     [facility], [place], MD 03/06/2015 3:07 PM

## 2015-03-06 NOTE — Assessment & Plan Note (Signed)
The patient responded well to G-CSF. There is no role for IVIG. I recommend she treat returns on a weekly basis for G-CSF injection to keep her  [place] greater than 1500. Her ANC is getting lower due to side effects of radiation. Thankfully, she has no infectious complication. I will continue once a week for now until her radiation is completed. If she start to develop infectious complication, I might consider increasing G-CSF to twice a week.

## 2015-03-07 ENCOUNTER — Ambulatory Visit
Admission: RE | Admit: 2015-03-07 | Discharge: 2015-03-07 | Disposition: A | Discharge status: Home / Self Care | Payer: BLUE CROSS/BLUE SHIELD | Source: Ambulatory Visit | Attending: Radiation Oncology | Admitting: Radiation Oncology

## 2015-03-07 ENCOUNTER — Telehealth: Payer: Self-pay | Admitting: *Deleted

## 2015-03-07 DIAGNOSIS — Z510 Encounter for antineoplastic radiation therapy: Secondary | ICD-10-CM | POA: Diagnosis not present

## 2015-03-07 NOTE — Telephone Encounter (Signed)
Called pt to notify of future scheduled appointment. Pt has appointment on 03/26/2015 @ 10:30 with Dr. Denman [name].  Pt agreed with time and date of appointment

## 2015-03-10 ENCOUNTER — Encounter: Payer: Self-pay | Admitting: Radiation Oncology

## 2015-03-10 ENCOUNTER — Ambulatory Visit
Admission: RE | Admit: 2015-03-10 | Discharge: 2015-03-10 | Disposition: A | Discharge status: Home / Self Care | Payer: BLUE CROSS/BLUE SHIELD | Source: Ambulatory Visit | Attending: Radiation Oncology | Admitting: Radiation Oncology

## 2015-03-10 DIAGNOSIS — Z510 Encounter for antineoplastic radiation therapy: Secondary | ICD-10-CM | POA: Diagnosis not present

## 2015-03-13 ENCOUNTER — Other Ambulatory Visit (HOSPITAL_BASED_OUTPATIENT_CLINIC_OR_DEPARTMENT_OTHER): Payer: BLUE CROSS/BLUE SHIELD

## 2015-03-13 ENCOUNTER — Ambulatory Visit: Payer: BLUE CROSS/BLUE SHIELD | Admitting: Hematology and Oncology

## 2015-03-13 ENCOUNTER — Ambulatory Visit (HOSPITAL_BASED_OUTPATIENT_CLINIC_OR_DEPARTMENT_OTHER): Payer: BLUE CROSS/BLUE SHIELD

## 2015-03-13 ENCOUNTER — Ambulatory Visit: Payer: BLUE CROSS/BLUE SHIELD

## 2015-03-13 VITALS — BP 108/65 | HR 77 | Temp 98.5°F

## 2015-03-13 DIAGNOSIS — D709 Neutropenia, unspecified: Secondary | ICD-10-CM | POA: Diagnosis not present

## 2015-03-13 LAB — CBC & DIFF AND RETIC
BASO%: 1.3 % (ref 0.0–2.0)
BASOS ABS: 0 10*3/uL (ref 0.0–0.1)
EOS%: 15.1 % — AB (ref 0.0–7.0)
Eosinophils Absolute: 0.2 10*3/uL (ref 0.0–0.5)
HCT: 34.3 % — ABNORMAL LOW (ref 34.8–46.6)
HEMOGLOBIN: 10.9 g/dL — AB (ref 11.6–15.9)
Immature Retic Fract: 18.4 % — ABNORMAL HIGH (ref 1.60–10.00)
LYMPH%: 13.2 % — ABNORMAL LOW (ref 14.0–49.7)
MCH: 27.4 pg (ref 25.1–34.0)
MCHC: 31.8 g/dL (ref 31.5–36.0)
MCV: 86.2 fL (ref 79.5–101.0)
MONO#: 0.5 10*3/uL (ref 0.1–0.9)
MONO%: 32.1 % — ABNORMAL HIGH (ref 0.0–14.0)
NEUT#: 0.6 10*3/uL — ABNORMAL LOW (ref 1.5–6.5)
NEUT%: 38.3 % — AB (ref 38.4–76.8)
Platelets: 233 10*3/uL (ref 145–400)
RBC: 3.98 10*6/uL (ref 3.70–5.45)
RDW: 18.3 % — ABNORMAL HIGH (ref 11.2–14.5)
Retic %: 2.27 % — ABNORMAL HIGH (ref 0.70–2.10)
Retic Ct Abs: 90.35 10*3/uL (ref 33.70–90.70)
WBC: 1.6 10*3/uL — ABNORMAL LOW (ref 3.9–10.3)
lymph#: 0.2 10*3/uL — ABNORMAL LOW (ref 0.9–3.3)
nRBC: 0 % (ref 0–0)

## 2015-03-13 MED ORDER — TBO-FILGRASTIM 480 MCG/0.8ML ~~LOC~~ SOSY
480.0000 ug | PREFILLED_SYRINGE | SUBCUTANEOUS | Status: DC
  Administered 2015-03-13: 480 ug via SUBCUTANEOUS
  Filled 2015-03-13: qty 0.8

## 2015-03-16 NOTE — Progress Notes (Signed)
  Radiation Oncology         [phone] ________________________________  Name: [name] MRN: [id number]  Date: 03/10/2015  DOB: 09-18-1955  End of Treatment Note    ICD-9-CM ICD-10-CM   1. 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   She did experience some problems with diarrhea during the course for treatment. This was treated with Lomotil with reasonable benefit. The patient also developed some right flank pain towards the end of her therapy and ultimately was found to have right hydronephrosis requiring replacement of her urinary stent.   Plan: The patient has completed radiation treatment. The patient will return to radiation oncology clinic for routine followup in one month. I advised them to call or return sooner if they have any questions or concerns related to their recovery or treatment.  -----------------------------------  Blair Promise, PhD, MD

## 2015-03-17 ENCOUNTER — Other Ambulatory Visit: Payer: Self-pay | Admitting: Urology

## 2015-03-18 ENCOUNTER — Encounter (HOSPITAL_BASED_OUTPATIENT_CLINIC_OR_DEPARTMENT_OTHER): Payer: Self-pay | Admitting: *Deleted

## 2015-03-18 ENCOUNTER — Other Ambulatory Visit: Payer: Self-pay | Admitting: *Deleted

## 2015-03-18 NOTE — Progress Notes (Signed)
   03/18/15 1210  OBSTRUCTIVE SLEEP APNEA  Have you ever been diagnosed with sleep apnea through a sleep study? No  Do you snore loudly (loud enough to be heard through closed doors)?  0  Do you often feel tired, fatigued, or sleepy during the daytime? 1  Has anyone observed you stop breathing during your sleep? 0  Do you have, or are you being treated for high blood pressure? 1  BMI more than 35 kg/m2? 1  Age over 60 years old? 1  Neck circumference greater than 40 cm/16 inches? 0  Gender: 0  Obstructive Sleep Apnea Score 4

## 2015-03-18 NOTE — Progress Notes (Signed)
Chart reviewed by Dr. Delma Post.  OK to proceed.

## 2015-03-18 NOTE — Progress Notes (Addendum)
Pt instructed npo pmn 8/17 x synthroid w sip of water.  Ok to take 1/2 dose benadryl (25mg ) if needed. To [facility] 8/18 @ 1100.  Needs istat 8 on arrival.  Ekg,chest CT, other labs in epic

## 2015-03-19 ENCOUNTER — Ambulatory Visit (HOSPITAL_BASED_OUTPATIENT_CLINIC_OR_DEPARTMENT_OTHER): Payer: BLUE CROSS/BLUE SHIELD

## 2015-03-19 ENCOUNTER — Encounter: Payer: Self-pay | Admitting: Hematology and Oncology

## 2015-03-19 ENCOUNTER — Ambulatory Visit (HOSPITAL_BASED_OUTPATIENT_CLINIC_OR_DEPARTMENT_OTHER): Payer: BLUE CROSS/BLUE SHIELD | Admitting: Hematology and Oncology

## 2015-03-19 ENCOUNTER — Other Ambulatory Visit (HOSPITAL_BASED_OUTPATIENT_CLINIC_OR_DEPARTMENT_OTHER): Payer: BLUE CROSS/BLUE SHIELD

## 2015-03-19 VITALS — BP 117/61 | HR 73 | Temp 97.9°F | Resp 73 | Ht 62.0 in | Wt 228.6 lb

## 2015-03-19 DIAGNOSIS — N1330 Unspecified hydronephrosis: Secondary | ICD-10-CM | POA: Insufficient documentation

## 2015-03-19 DIAGNOSIS — R102 Pelvic and perineal pain: Secondary | ICD-10-CM | POA: Insufficient documentation

## 2015-03-19 DIAGNOSIS — C549 Malignant neoplasm of corpus uteri, unspecified: Secondary | ICD-10-CM | POA: Diagnosis not present

## 2015-03-19 DIAGNOSIS — D709 Neutropenia, unspecified: Secondary | ICD-10-CM

## 2015-03-19 DIAGNOSIS — D630 Anemia in neoplastic disease: Secondary | ICD-10-CM

## 2015-03-19 LAB — CBC & DIFF AND RETIC
BASO%: 2.3 % — ABNORMAL HIGH (ref 0.0–2.0)
Basophils Absolute: 0 10*3/uL (ref 0.0–0.1)
EOS ABS: 0.2 10*3/uL (ref 0.0–0.5)
EOS%: 10.5 % — ABNORMAL HIGH (ref 0.0–7.0)
HCT: 35.9 % (ref 34.8–46.6)
HGB: 11.5 g/dL — ABNORMAL LOW (ref 11.6–15.9)
Immature Retic Fract: 14.9 % — ABNORMAL HIGH (ref 1.60–10.00)
LYMPH%: 18.6 % (ref 14.0–49.7)
MCH: 27.8 pg (ref 25.1–34.0)
MCHC: 32 g/dL (ref 31.5–36.0)
MCV: 86.9 fL (ref 79.5–101.0)
MONO#: 0.7 10*3/uL (ref 0.1–0.9)
MONO%: 39.5 % — AB (ref 0.0–14.0)
NEUT#: 0.5 10*3/uL — CL (ref 1.5–6.5)
NEUT%: 29.1 % — AB (ref 38.4–76.8)
PLATELETS: 232 10*3/uL (ref 145–400)
RBC: 4.13 10*6/uL (ref 3.70–5.45)
RDW: 18.4 % — ABNORMAL HIGH (ref 11.2–14.5)
Retic %: 2.19 % — ABNORMAL HIGH (ref 0.70–2.10)
Retic Ct Abs: 90.45 10*3/uL (ref 33.70–90.70)
WBC: 1.7 10*3/uL — ABNORMAL LOW (ref 3.9–10.3)
lymph#: 0.3 10*3/uL — ABNORMAL LOW (ref 0.9–3.3)
nRBC: 0 % (ref 0–0)

## 2015-03-19 MED ORDER — TBO-FILGRASTIM 480 MCG/0.8ML ~~LOC~~ SOSY
480.0000 ug | PREFILLED_SYRINGE | SUBCUTANEOUS | Status: DC
  Administered 2015-03-19: 480 ug via SUBCUTANEOUS
  Filled 2015-03-19: qty 0.8

## 2015-03-19 MED ORDER — OXYCODONE HCL 5 MG PO TABS: 5.0000 mg | ORAL_TABLET | ORAL | Status: DC | PRN

## 2015-03-19 NOTE — Assessment & Plan Note (Signed)
I will defer to her surgical oncologist and radiation oncologist for further management. She is doing well with radiation and has completed all adjuvant treatment

## 2015-03-19 NOTE — Assessment & Plan Note (Signed)
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 I refilled her prescription for pain medicine today and warned her about potential side effects of narcotic prescriptions. We discussed about narcotic refill policy.

## 2015-03-19 NOTE — Assessment & Plan Note (Signed)
The patient has completed her radiation treatment. She is undergoing urology procedure tomorrow for stent placement for hydronephrosis. Due to her history of recurrent infection, I recommend giving her G-CSF today and tomorrow and recheck on Friday. The goal would be to keep the [place] greater than 1500 to reduce the risk of infection. After the procedure next week, she will come back on a weekly basis for blood draw and intermittent G-CSF injection for at least another month.

## 2015-03-19 NOTE — Assessment & Plan Note (Signed)
The patient have right-sided hydronephrosis of unknown etiology. I am wondering whether this could be due to ureter stricture She will be undergoing cystoscopy and stent placement tomorrow. I will defer to the urologist for further management

## 2015-03-19 NOTE — Assessment & Plan Note (Signed)
This is likely anemia of chronic disease and iron deficiency anemia. The patient denies recent history of bleeding such as epistaxis, hematuria or hematochezia. She is asymptomatic from the anemia. We will observe for now.  She does not require transfusion now.  She will continue on oral iron supplements

## 2015-03-19 NOTE — Progress Notes (Signed)
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 Going to the patient, initial diagnosis with ovarian cancer was when she presented with abdomen and no swelling and pelvic pain leading to her urgent care visit. She states that the tumor had caused torsion of the ovary. She cannot remember the stage of her disease. She began to develop leukopenia in approximately July of 2010. She underwent an initial bone marrow biopsy at [facility] in [place] on 03/17/2009. but it was nondiagnostic. No gross dysplastic changes, no excess blasts, normal cytogenetics.  Her counts have been stable over observation through this office since December 2010 with some minor fluctuations. Total white counts run as low as 1800 and as high as 2900. Percent neutrophils fluctuates very widely from as low as 4% recorded on a CBC done 02/03/2010 to as high as 31% with average being 20%. She was hospitalized in the ICU at that time  Both hemoglobin and platelets have remained stable over time.  Despite leukopenia, she has not had any recurrent or severe infections. She stated strong family history of lupus in a cousin. She complained of occasional skin rash and joint stiffness. 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 She received G-CSF in January of 2015  to stimulate white blood cell production around a colonoscopy procedure a few years ago. She had a limited response. On 01/23/2014, repeat bone marrow biopsy excluded lymphoma or leukemia. On 09/12/2014, CT scan of the abdomen and pelvis show possible relapse of gynecological Malignancy On 10/14/2014, MRI of the pelvis show significant pelvic mass without invasion into the bladder but abutting to the rectum On 11/19/2014, she underwent surgery and had robotic-assisted lysis of adhesions, converted to laparotomy, radical upper vaginectomy and low anterior resection with colostomy. Bilateral ureteral stent placement and cystotomy repair. Pathology Accession: [id number] showed recurrent endometrioid carcinoma with squamous differentiation involving the colonic mucosa and vagina mucosa. Resection margins were negative. She was hospitalized from 11/19/2014 to 11/25/2014. In May, showed recurrent admission to the hospital due to complication with infection. On 01/29/2015, she began adjuvant radiation treatment On 03/03/2015, CT scan of the abdomen and pelvis showed status post interval removal of the bilateral nephroureteral stents. Worsening moderate right hydroureteronephrosis. Resolved left hydroureteronephrosis.   INTERVAL HISTORY: Please see below for problem oriented charting. She returns for further follow-up. She complained of moderate pelvic pain. She is concerned about urology procedure tomorrow. She denies recent infection. She have regular diarrhea since radiation is completed. The patient denies any recent signs or symptoms of bleeding such as spontaneous epistaxis, hematuria or hematochezia.   REVIEW OF SYSTEMS:   Constitutional: Denies fevers, chills or abnormal weight loss Eyes: Denies blurriness of vision Ears, nose, mouth, throat, and face: Denies mucositis or sore throat Respiratory: Denies cough, dyspnea or wheezes Cardiovascular: Denies palpitation, chest discomfort or lower  extremity swelling Skin: Denies abnormal skin rashes Lymphatics: Denies new lymphadenopathy or easy bruising Neurological:Denies numbness, tingling or new weaknesses Behavioral/Psych: Mood  is stable, no new changes  All other systems were reviewed with the patient and are negative.  I have reviewed the past medical history, past surgical history, social history and family history with the patient and they are unchanged from previous note.  ALLERGIES:  is allergic to penicillins; ultram; adhesive; cefaclor; erythromycin; trimethoprim; pectin; and sulfa antibiotics.  MEDICATIONS:  Current Outpatient Prescriptions  Medication Sig Dispense Refill  . Biotin 5 MG TABS Take by mouth.    . Calcium Carbonate-Vitamin D (CALCIUM + D PO) Take 2 tablets by mouth daily.    . Canagliflozin (INVOKANA) 300 MG TABS Take 1 tablet by mouth every morning.     . Cholecalciferol (VITAMIN D3) 10000 UNITS capsule Take 10,000 Units by mouth once a week. Sundays    . diphenhydrAMINE (BENADRYL) 25 MG tablet Take 25 mg by mouth every 4 (four) hours as needed.    . hyaluronate sodium (RADIAPLEXRX) GEL Apply 1 application topically 2 (two) times daily.    [name] Kitchen levothyroxine (SYNTHROID) 175 MCG tablet Take 175 mcg by mouth daily before breakfast.    . loratadine (CLARITIN) 10 MG tablet Take 10 mg by mouth daily.    . metFORMIN (GLUCOPHAGE) 1000 MG tablet Take 1,000 mg by mouth 2 (two) times daily with a meal.     . omeprazole (PRILOSEC) 20 MG capsule Take 20 mg by mouth at bedtime.     Glory Rosebush VERIO test strip     . oxyCODONE (OXY IR/ROXICODONE) 5 MG immediate release tablet Take 1 tablet (5 mg total) by mouth every 4 (four) hours as needed for moderate pain or severe pain. 90 tablet 0  . Polyethyl Glycol-Propyl Glycol (SYSTANE OP) Apply 1 drop to eye daily as needed (Dry eyes).     . rosuvastatin (CRESTOR) 10 MG tablet Take 10 mg by mouth every evening.     . sitaGLIPtin (JANUVIA) 100 MG tablet Take 100 mg by mouth  daily.    . Tbo-Filgrastim (GRANIX) 480 MCG/0.8ML SOSY injection Inject 480 mcg into the skin once a week. _0     . valsartan (DIOVAN) 320 MG tablet Take 320 mg by mouth daily.    . Alum & Mag Hydroxide-Simeth (MAGIC MOUTHWASH) SOLN Take 5 mLs by mouth daily as needed for mouth pain (mouth pain).    [name] Kitchen diphenoxylate-atropine (LOMOTIL) 2.5-0.025 MG per tablet Take 1 tablet by mouth 4 (four) times daily as needed for diarrhea or loose stools. (Patient not taking: Reported on 03/19/2015) 60 tablet 0  . ferrous sulfate 325 (65 FE) MG tablet Take 1 tablet (325 mg total) by mouth 2 (two) times daily with a meal. (Patient not taking: Reported on 03/19/2015) 60 tablet 3  . fluticasone (FLOVENT HFA) 110 MCG/ACT inhaler Inhale 1 puff into the lungs 2 (two) times daily.    [name] Kitchen ibuprofen (ADVIL,MOTRIN) 800 MG tablet Take 800 mg by mouth every 8 (eight) hours as needed for moderate pain (pain).    [name] Kitchen loperamide (IMODIUM) 2 MG capsule Take by mouth as needed for diarrhea or loose stools.    . magnesium chloride (SLOW-MAG) 64 MG TBEC SR tablet Take 2 tablets (128 mg total) by mouth 2 (two) times daily. (Patient not taking: Reported on 03/19/2015) 120 tablet 0  . Multiple Vitamin (MULTIVITAMIN WITH MINERALS) TABS tablet Take 1 tablet by mouth daily.    . naproxen sodium (ANAPROX) 220 MG tablet Take 220 mg by mouth 2 (two) times daily with a meal.    . omega-3 acid ethyl esters (  LOVAZA) 1 G capsule Take 1 g by mouth 2 (two) times daily.    . ondansetron (ZOFRAN) 4 MG tablet Take 1 tablet (4 mg total) by mouth every 6 (six) hours as needed for nausea. (Patient not taking: Reported on 03/19/2015) 20 tablet 0  . phenazopyridine (PYRIDIUM) 200 MG tablet Take 200 mg by mouth 3 (three) times daily as needed for pain. Called in to [facility] per Dr. Sondra Come.  Disp. 30 tablets. 2 refills.    . potassium chloride SA (K-DUR,KLOR-CON) 20 MEQ tablet Take 1 tablet (20 mEq total) by mouth daily. (Patient not taking: Reported on 03/19/2015)  30 tablet 0  . saccharomyces boulardii (FLORASTOR) 250 MG capsule Take 1 capsule (250 mg total) by mouth 2 (two) times daily. (Patient not taking: Reported on 03/19/2015) 60 capsule 2   No current facility-administered medications for this visit.   Facility-Administered Medications Ordered in Other Visits  Medication Dose Route Frequency Provider Last Rate Last Dose  . Tbo-Filgrastim (GRANIX) injection 480 mcg  480 mcg Subcutaneous Weekly Heath Lark, MD   480 mcg at 03/19/15 1205    PHYSICAL EXAMINATION: ECOG PERFORMANCE STATUS: 1 - Symptomatic but completely ambulatory  Filed Vitals:   03/19/15 1129  BP: 117/61  Pulse: 73  Temp: 97.9 F (36.6 C)  Resp: 73   Filed Weights   03/19/15 1129  Weight: 228 lb 9.6 oz (103.692 kg)    GENERAL:alert, no distress and comfortable SKIN: skin color, texture, turgor are normal, no rashes or significant lesions EYES: normal, Conjunctiva are pink and non-injected, sclera clear Musculoskeletal:no cyanosis of digits and no clubbing  NEURO: alert & oriented x 3 with fluent speech, no focal motor/sensory deficits  LABORATORY DATA:  I have reviewed the data as listed    Component Value Date/Time   NA 142 03/06/2015 1340   NA 141 01/06/2015 1610   K 3.8 03/06/2015 1340   K 4.0 01/06/2015 1610   CL 105 01/06/2015 1610   CO2 29 03/06/2015 1340   CO2 24 01/06/2015 1610   GLUCOSE 139 03/06/2015 1340   GLUCOSE 126* 01/06/2015 1610   BUN 18.4 03/06/2015 1340   BUN 17 01/06/2015 1610   CREATININE 0.9 03/06/2015 1340   CREATININE 0.80 01/06/2015 1610   CREATININE 0.63 01/02/2015 0400   CALCIUM 8.9 03/06/2015 1340   CALCIUM 9.6 01/06/2015 1610   PROT 6.7 12/31/2014 0638   PROT 6.4 11/29/2014 1251   ALBUMIN 2.8* 12/31/2014 0638   ALBUMIN 2.6* 11/29/2014 1251   AST 11* 12/31/2014 0638   AST 15 11/29/2014 1251   ALT 12* 12/31/2014 0638   ALT 17 11/29/2014 1251   ALKPHOS 57 12/31/2014 0638   ALKPHOS 77 11/29/2014 1251   BILITOT 0.6  12/31/2014 0638   BILITOT 0.34 11/29/2014 1251   GFRNONAA >60 01/02/2015 0400   GFRNONAA 78 12/16/2014 1530   GFRAA >60 01/02/2015 0400   GFRAA >89 12/16/2014 1530    No results found for: SPEP, UPEP  Lab Results  Component Value Date   WBC 1.7* 03/19/2015   NEUTROABS 0.5* 03/19/2015   HGB 11.5* 03/19/2015   HCT 35.9 03/19/2015   MCV 86.9 03/19/2015   PLT 232 03/19/2015      Chemistry      Component Value Date/Time   NA 142 03/06/2015 1340   NA 141 01/06/2015 1610   K 3.8 03/06/2015 1340   K 4.0 01/06/2015 1610   CL 105 01/06/2015 1610   CO2 29 03/06/2015 1340   CO2 24  01/06/2015 1610   BUN 18.4 03/06/2015 1340   BUN 17 01/06/2015 1610   CREATININE 0.9 03/06/2015 1340   CREATININE 0.80 01/06/2015 1610   CREATININE 0.63 01/02/2015 0400      Component Value Date/Time   CALCIUM 8.9 03/06/2015 1340   CALCIUM 9.6 01/06/2015 1610   ALKPHOS 57 12/31/2014 0638   ALKPHOS 77 11/29/2014 1251   AST 11* 12/31/2014 0638   AST 15 11/29/2014 1251   ALT 12* 12/31/2014 0638   ALT 17 11/29/2014 1251   BILITOT 0.6 12/31/2014 0638   BILITOT 0.34 11/29/2014 1251      ASSESSMENT & PLAN:  Chronic neutropenia The patient has completed her radiation treatment. She is undergoing urology procedure tomorrow for stent placement for hydronephrosis. Due to her history of recurrent infection, I recommend giving her G-CSF today and tomorrow and recheck on Friday. The goal would be to keep the [place] greater than 1500 to reduce the risk of infection. After the procedure next week, she will come back on a weekly basis for blood draw and intermittent G-CSF injection for at least another month.  Anemia in neoplastic disease This is likely anemia of chronic disease and iron deficiency anemia. The patient denies recent history of bleeding such as epistaxis, hematuria or hematochezia. She is asymptomatic from the anemia. We will observe for now.  She does not require transfusion now.  She will  continue on oral iron supplements      Cancer of corpus uteri, except isthmus I will defer to her surgical oncologist and radiation oncologist for further management. She is doing well with radiation and has completed all adjuvant treatment    Hydronephrosis, right The patient have right-sided hydronephrosis of unknown etiology. I am wondering whether this could be due to ureter stricture She will be undergoing cystoscopy and stent placement tomorrow. 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 I refilled her prescription for pain medicine today and warned her about potential side effects of narcotic prescriptions. We discussed about narcotic refill policy.   No orders of the defined types were placed in this encounter.   All questions were answered. The patient knows to call the clinic with any problems, questions or concerns. No barriers to learning was detected. I spent 25 minutes counseling the patient face to face. The total time spent in the appointment was 30 minutes and more than 50% was on counseling and review of test results     [facility], [place], MD 03/19/2015 12:59 PM

## 2015-03-20 ENCOUNTER — Ambulatory Visit: Payer: BLUE CROSS/BLUE SHIELD | Admitting: Family Medicine

## 2015-03-20 ENCOUNTER — Ambulatory Visit: Payer: BLUE CROSS/BLUE SHIELD | Admitting: Hematology and Oncology

## 2015-03-20 ENCOUNTER — Ambulatory Visit (HOSPITAL_BASED_OUTPATIENT_CLINIC_OR_DEPARTMENT_OTHER)
Admission: RE | Admit: 2015-03-20 | Discharge: 2015-03-20 | Disposition: A | Discharge status: Home / Self Care | Payer: BLUE CROSS/BLUE SHIELD | Source: Ambulatory Visit | Attending: Urology | Admitting: Urology

## 2015-03-20 ENCOUNTER — Ambulatory Visit (HOSPITAL_BASED_OUTPATIENT_CLINIC_OR_DEPARTMENT_OTHER): Payer: BLUE CROSS/BLUE SHIELD

## 2015-03-20 ENCOUNTER — Ambulatory Visit (HOSPITAL_BASED_OUTPATIENT_CLINIC_OR_DEPARTMENT_OTHER): Payer: BLUE CROSS/BLUE SHIELD | Admitting: Anesthesiology

## 2015-03-20 ENCOUNTER — Encounter (HOSPITAL_BASED_OUTPATIENT_CLINIC_OR_DEPARTMENT_OTHER)
Admission: RE | Disposition: A | Discharge status: Home / Self Care | Payer: Self-pay | Source: Ambulatory Visit | Attending: Urology

## 2015-03-20 ENCOUNTER — Ambulatory Visit: Payer: BLUE CROSS/BLUE SHIELD

## 2015-03-20 ENCOUNTER — Encounter (HOSPITAL_BASED_OUTPATIENT_CLINIC_OR_DEPARTMENT_OTHER): Payer: Self-pay | Admitting: Anesthesiology

## 2015-03-20 ENCOUNTER — Other Ambulatory Visit: Payer: BLUE CROSS/BLUE SHIELD

## 2015-03-20 VITALS — BP 114/71 | HR 84 | Temp 98.5°F

## 2015-03-20 DIAGNOSIS — R609 Edema, unspecified: Secondary | ICD-10-CM | POA: Insufficient documentation

## 2015-03-20 DIAGNOSIS — Z923 Personal history of irradiation: Secondary | ICD-10-CM | POA: Insufficient documentation

## 2015-03-20 DIAGNOSIS — D709 Neutropenia, unspecified: Secondary | ICD-10-CM

## 2015-03-20 DIAGNOSIS — Z800 Family history of malignant neoplasm of digestive organs: Secondary | ICD-10-CM | POA: Insufficient documentation

## 2015-03-20 DIAGNOSIS — Z8544 Personal history of malignant neoplasm of other female genital organs: Secondary | ICD-10-CM | POA: Diagnosis not present

## 2015-03-20 DIAGNOSIS — Z79899 Other long term (current) drug therapy: Secondary | ICD-10-CM | POA: Insufficient documentation

## 2015-03-20 DIAGNOSIS — Z8542 Personal history of malignant neoplasm of other parts of uterus: Secondary | ICD-10-CM | POA: Diagnosis not present

## 2015-03-20 DIAGNOSIS — K219 Gastro-esophageal reflux disease without esophagitis: Secondary | ICD-10-CM | POA: Insufficient documentation

## 2015-03-20 DIAGNOSIS — E119 Type 2 diabetes mellitus without complications: Secondary | ICD-10-CM | POA: Insufficient documentation

## 2015-03-20 DIAGNOSIS — E039 Hypothyroidism, unspecified: Secondary | ICD-10-CM | POA: Diagnosis not present

## 2015-03-20 DIAGNOSIS — K449 Diaphragmatic hernia without obstruction or gangrene: Secondary | ICD-10-CM | POA: Diagnosis not present

## 2015-03-20 DIAGNOSIS — Z85038 Personal history of other malignant neoplasm of large intestine: Secondary | ICD-10-CM | POA: Diagnosis not present

## 2015-03-20 DIAGNOSIS — Z833 Family history of diabetes mellitus: Secondary | ICD-10-CM | POA: Insufficient documentation

## 2015-03-20 DIAGNOSIS — E042 Nontoxic multinodular goiter: Secondary | ICD-10-CM | POA: Insufficient documentation

## 2015-03-20 DIAGNOSIS — Z6841 Body Mass Index (BMI) 40.0 and over, adult: Secondary | ICD-10-CM | POA: Insufficient documentation

## 2015-03-20 DIAGNOSIS — I10 Essential (primary) hypertension: Secondary | ICD-10-CM | POA: Diagnosis not present

## 2015-03-20 DIAGNOSIS — N1330 Unspecified hydronephrosis: Secondary | ICD-10-CM | POA: Diagnosis present

## 2015-03-20 DIAGNOSIS — G709 Myoneural disorder, unspecified: Secondary | ICD-10-CM | POA: Insufficient documentation

## 2015-03-20 DIAGNOSIS — Z933 Colostomy status: Secondary | ICD-10-CM | POA: Diagnosis not present

## 2015-03-20 DIAGNOSIS — Z9221 Personal history of antineoplastic chemotherapy: Secondary | ICD-10-CM | POA: Insufficient documentation

## 2015-03-20 DIAGNOSIS — N131 Hydronephrosis with ureteral stricture, not elsewhere classified: Secondary | ICD-10-CM | POA: Diagnosis not present

## 2015-03-20 HISTORY — DX: Dysuria: R30.0

## 2015-03-20 HISTORY — PX: CYSTOSCOPY WITH RETROGRADE PYELOGRAM, URETEROSCOPY AND STENT PLACEMENT: SHX5789

## 2015-03-20 HISTORY — DX: Radiodermatitis, unspecified: L58.9

## 2015-03-20 LAB — POCT I-STAT, CHEM 8
BUN: 25 mg/dL — ABNORMAL HIGH (ref 6–20)
CALCIUM ION: 1.26 mmol/L (ref 1.13–1.30)
Chloride: 101 mmol/L (ref 101–111)
Creatinine, Ser: 0.9 mg/dL (ref 0.44–1.00)
Glucose, Bld: 108 mg/dL — ABNORMAL HIGH (ref 65–99)
HCT: 37 % (ref 36.0–46.0)
HEMOGLOBIN: 12.6 g/dL (ref 12.0–15.0)
Potassium: 3.7 mmol/L (ref 3.5–5.1)
SODIUM: 140 mmol/L (ref 135–145)
TCO2: 23 mmol/L (ref 0–100)

## 2015-03-20 LAB — GLUCOSE, CAPILLARY: GLUCOSE-CAPILLARY: 103 mg/dL — AB (ref 65–99)

## 2015-03-20 SURGERY — CYSTOURETEROSCOPY, WITH RETROGRADE PYELOGRAM AND STENT INSERTION
Anesthesia: General | Site: Ureter | Laterality: Right

## 2015-03-20 MED ORDER — PROMETHAZINE HCL 25 MG/ML IJ SOLN
6.2500 mg | INTRAMUSCULAR | Status: DC | PRN
  Filled 2015-03-20: qty 1

## 2015-03-20 MED ORDER — TBO-FILGRASTIM 480 MCG/0.8ML ~~LOC~~ SOSY
480.0000 ug | PREFILLED_SYRINGE | SUBCUTANEOUS | Status: DC
  Administered 2015-03-20: 480 ug via SUBCUTANEOUS
  Filled 2015-03-20: qty 0.8

## 2015-03-20 MED ORDER — SODIUM CHLORIDE 0.9 % IR SOLN
Status: DC | PRN
  Administered 2015-03-20: 3000 mL

## 2015-03-20 MED ORDER — LIDOCAINE HCL (CARDIAC) 20 MG/ML IV SOLN
INTRAVENOUS | Status: DC | PRN
  Administered 2015-03-20: 60 mg via INTRAVENOUS

## 2015-03-20 MED ORDER — PROPOFOL 10 MG/ML IV BOLUS
INTRAVENOUS | Status: DC | PRN
  Administered 2015-03-20: 160 mg via INTRAVENOUS

## 2015-03-20 MED ORDER — GENTAMICIN SULFATE 40 MG/ML IJ SOLN
5.0000 mg/kg | INTRAVENOUS | Status: AC
  Administered 2015-03-20: 360 mg via INTRAVENOUS
  Filled 2015-03-20: qty 9

## 2015-03-20 MED ORDER — OXYCODONE HCL 5 MG PO TABS
5.0000 mg | ORAL_TABLET | ORAL | Status: DC | PRN
  Filled 2015-03-20: qty 1

## 2015-03-20 MED ORDER — FENTANYL CITRATE (PF) 100 MCG/2ML IJ SOLN
INTRAMUSCULAR | Status: AC
  Filled 2015-03-20: qty 6

## 2015-03-20 MED ORDER — MIDAZOLAM HCL 2 MG/2ML IJ SOLN
INTRAMUSCULAR | Status: AC
  Filled 2015-03-20: qty 2

## 2015-03-20 MED ORDER — FENTANYL CITRATE (PF) 100 MCG/2ML IJ SOLN
25.0000 ug | INTRAMUSCULAR | Status: DC | PRN
  Filled 2015-03-20: qty 1

## 2015-03-20 MED ORDER — IOHEXOL 350 MG/ML SOLN
INTRAVENOUS | Status: DC | PRN
  Administered 2015-03-20: 29 mL

## 2015-03-20 MED ORDER — MIDAZOLAM HCL 5 MG/5ML IJ SOLN
INTRAMUSCULAR | Status: DC | PRN
  Administered 2015-03-20: 2 mg via INTRAVENOUS

## 2015-03-20 MED ORDER — GENTAMICIN IN SALINE 1.6-0.9 MG/ML-% IV SOLN
80.0000 mg | INTRAVENOUS | Status: DC
  Filled 2015-03-20: qty 50

## 2015-03-20 MED ORDER — OXYCODONE HCL 5 MG PO TABS: 5.0000 mg | ORAL_TABLET | ORAL | Status: DC | PRN

## 2015-03-20 MED ORDER — LACTATED RINGERS IV SOLN
INTRAVENOUS | Status: DC
  Administered 2015-03-20: 12:00:00 via INTRAVENOUS
  Filled 2015-03-20: qty 1000

## 2015-03-20 MED ORDER — OXYCODONE HCL 5 MG PO TABS
ORAL_TABLET | ORAL | Status: AC
  Filled 2015-03-20: qty 1

## 2015-03-20 MED ORDER — FENTANYL CITRATE (PF) 100 MCG/2ML IJ SOLN
INTRAMUSCULAR | Status: DC | PRN
  Administered 2015-03-20 (×2): 25 ug via INTRAVENOUS
  Administered 2015-03-20: 50 ug via INTRAVENOUS
  Administered 2015-03-20 (×4): 25 ug via INTRAVENOUS

## 2015-03-20 MED ORDER — ONDANSETRON HCL 4 MG/2ML IJ SOLN
INTRAMUSCULAR | Status: DC | PRN
  Administered 2015-03-20: 4 mg via INTRAVENOUS

## 2015-03-20 SURGICAL SUPPLY — 25 items
BAG DRAIN URO-CYSTO SKYTR STRL (DRAIN) ×2 IMPLANT
BASKET LASER NITINOL 1.9FR (BASKET) IMPLANT
BASKET STONE 1.7 NGAGE (UROLOGICAL SUPPLIES) IMPLANT
BASKET ZERO TIP NITINOL 2.4FR (BASKET) IMPLANT
CATH INTERMIT  6FR 70CM (CATHETERS) IMPLANT
CLOTH BEACON ORANGE TIMEOUT ST (SAFETY) ×2 IMPLANT
FIBER LASER FLEXIVA 365 (UROLOGICAL SUPPLIES) IMPLANT
FIBER LASER TRAC TIP (UROLOGICAL SUPPLIES) IMPLANT
GLOVE BIO SURGEON STRL SZ 6.5 (GLOVE) ×2 IMPLANT
GLOVE BIO SURGEON STRL SZ7.5 (GLOVE) ×2 IMPLANT
GLOVE INDICATOR 6.5 STRL GRN (GLOVE) ×2 IMPLANT
GOWN STRL REUS W/ TWL LRG LVL3 (GOWN DISPOSABLE) ×1 IMPLANT
GOWN STRL REUS W/ TWL XL LVL3 (GOWN DISPOSABLE) ×2 IMPLANT
GOWN STRL REUS W/TWL LRG LVL3 (GOWN DISPOSABLE) ×1
GOWN STRL REUS W/TWL XL LVL3 (GOWN DISPOSABLE) ×2
GUIDEWIRE ANG ZIPWIRE 038X150 (WIRE) ×2 IMPLANT
GUIDEWIRE STR DUAL SENSOR (WIRE) ×2 IMPLANT
IV NS IRRIG 3000ML ARTHROMATIC (IV SOLUTION) ×4 IMPLANT
KIT BALLN UROMAX 15FX4 (MISCELLANEOUS) ×1 IMPLANT
KIT BALLN UROMAX 26 75X4 (MISCELLANEOUS) ×1
MANIFOLD NEPTUNE II (INSTRUMENTS) ×2 IMPLANT
PACK CYSTO (CUSTOM PROCEDURE TRAY) ×2 IMPLANT
STENT CONTOUR 8FR X 24 (STENTS) ×2 IMPLANT
SYRINGE 10CC LL (SYRINGE) ×2 IMPLANT
TUBE FEEDING 8FR 16IN STR KANG (MISCELLANEOUS) ×2 IMPLANT

## 2015-03-20 NOTE — Brief Op Note (Signed)
03/20/2015  1:24 PM  PATIENT:  [name] Felter  59 y.o. female  PRE-OPERATIVE DIAGNOSIS:  RIGHT HYDRONEPHROSIS, FLANK PAIN  POST-OPERATIVE DIAGNOSIS:  RIGHT HYDRONEPHROSIS, FLANK PAIN  PROCEDURE:  Procedure(s): CYSTOSCOPY WITH RETROGRADE PYELOGRAM, URETEROSCOPY WITH BALLOON DILATION AND STENT PLACEMENT ON RIGHT (Right)  SURGEON:  Surgeon(s) and Role:    * Alexis Frock, MD - Primary  PHYSICIAN ASSISTANT:   ASSISTANTS: none   ANESTHESIA:   general  EBL:  Total I/O In: 200 [I.V.:200] Out: -   BLOOD ADMINISTERED:none  DRAINS: none   LOCAL MEDICATIONS USED:  NONE  SPECIMEN:  No Specimen  DISPOSITION OF SPECIMEN:  N/A  COUNTS:  YES  TOURNIQUET:  * No tourniquets in log *  DICTATION: .Other Dictation: Dictation Number [id number]  PLAN OF CARE: Discharge to home after PACU  PATIENT DISPOSITION:  PACU - hemodynamically stable.   Delay start of Pharmacological VTE agent (>24hrs) due to surgical blood loss or risk of bleeding: not applicable

## 2015-03-20 NOTE — Anesthesia Postprocedure Evaluation (Signed)
  Anesthesia Post-op Note  Patient: Financial planner  Procedure(s) Performed: Procedure(s) (LRB): CYSTOSCOPY WITH RETROGRADE PYELOGRAM, URETEROSCOPY WITH BALLOON DILATION AND STENT PLACEMENT ON RIGHT (Right)  Patient Location: PACU  Anesthesia Type: General  Level of Consciousness: awake and alert   Airway and Oxygen Therapy: Patient Spontanous Breathing  Post-op Pain: mild  Post-op Assessment: Post-op Vital signs reviewed, Patient's Cardiovascular Status Stable, Respiratory Function Stable, Patent Airway and No signs of Nausea or vomiting  Last Vitals:  Filed Vitals:   03/20/15 1400  BP: 115/88  Pulse: 77  Temp:   Resp: 16    Post-op Vital Signs: stable   Complications: No apparent anesthesia complications

## 2015-03-20 NOTE — Anesthesia Preprocedure Evaluation (Addendum)
Anesthesia Evaluation  Patient identified by MRN, date of birth, ID band Patient awake  General Assessment Comment:Past Medical History Diagnosis Date . Hypothyroidism 12/14/2011 . Benign essential HTN 12/14/2011 . Neutropenia  . Postmenopausal bleeding 09/05/2014 . Anemia    Planned Neupogen injections for chronic neutropenia . Transfusion history    '06-s/p hysterectomy and staging . Multiple thyroid nodules y-8   Managed by Dr. Harlow Asa . GERD (gastroesophageal reflux disease)  . Polyp of duodenum y-2 . Hx of bronchitis  . Depression  . Hiatal hernia y-12   hx of small hiatal hernia  . Neutropenia    chronic  . 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 . Cancer    '06-Ovarian/ Uterine Cancer-chemotherapy . DM type 2 (diabetes mellitus, type 2) 12/14/2011 . Dysrhythmia    skipped beat with occasional coffee  . Blood dyscrasia    chronic neutropenia . Dysuria  . Hydronephrosis of right kidney  . Radiation-induced dermatitis    contact dermatitis , radiation completed, rash only on ankles now.       Reviewed: Allergy & Precautions, NPO status , Patient's Chart, lab work & pertinent test results  Airway Mallampati: II  TM Distance: >3 FB Neck ROM: Full    Dental no notable dental hx.    Pulmonary neg pulmonary ROS,  breath sounds clear to auscultation  Pulmonary exam normal       Cardiovascular hypertension, Pt. on medications Normal cardiovascular exam+ dysrhythmias Rhythm:Regular Rate:Normal     Neuro/Psych PSYCHIATRIC DISORDERS Depression  Neuromuscular disease    GI/Hepatic Neg liver ROS, hiatal hernia, GERD-  Medicated,  Endo/Other  diabetes, Type 2, Oral Hypoglycemic AgentsHypothyroidism Morbid obesity  Renal/GU Renal diseaseHydronephrosis. Most recent Cr and K normal.  negative genitourinary    Musculoskeletal negative musculoskeletal ROS (+)   Abdominal (+) + obese,   Peds negative pediatric ROS (+)  Hematology  (+) Blood dyscrasia, anemia ,   Anesthesia Other Findings Her eyelids differ. The left is more open and doesn't close as well. She has had eyelid surgery and she uses Systane before bed.  Reproductive/Obstetrics negative OB ROS                           Anesthesia Physical Anesthesia Plan  ASA: III  Anesthesia Plan: General   Post-op Pain Management:    Induction: Intravenous  Airway Management Planned: LMA  Additional Equipment:   Intra-op Plan:   Post-operative Plan: Extubation in OR  Informed Consent: I have reviewed the patients History and Physical, chart, labs and discussed the procedure including the risks, benefits and alternatives for the proposed anesthesia with the patient or authorized representative who has indicated his/her understanding and acceptance.   Dental advisory given  Plan Discussed with: CRNA  Anesthesia Plan Comments:         Anesthesia Quick Evaluation

## 2015-03-20 NOTE — H&P (Signed)
[name] is an 59 y.o. female.    Chief Complaint: Pre-op Right Ureteroscopy / Possible Dilation / Stent pladement  HPI:    1 - Bladder Injury - s/p cystotomy repair and bilateral JJ stent placement by GYN oncology team 11/2014 at time of pelvic exenteration / end colostomy for metastatic GYN cancer. F/u imaging with CT 12/30/14 w/o bladder leak and stents in good position and subsequently removed. Most recent Cr normal.  2 -  Metastatic Endometrial Cancer - s/p repeat resection [phone] with colon and vaginal involvment but negative nodes / margins. She has had extensive pelvic radiation as well.  3 - Right Hydronephrosis - new right hydro by CT 03/2015 to level of distal ureter (near iliacs and numerous surgical clips). Cr <1. No fevers. Etiology unclear though likely benign v. Malignant stricture.  PMH sig for DM2, morbid obesity. Her PCP is [name].  Today "[name]" is seen to proceed with cysto, right retrograde / ureteroscopy / possible dilation / stent placement for further characterization and management of her right hydronephrosis. Most recent UCX scant non-clonal growth. NO revers. She remains neutropenic.  Past Medical History  Diagnosis Date  . Hypothyroidism 12/14/2011  . Benign essential HTN 12/14/2011  . Neutropenia   . Postmenopausal bleeding 09/05/2014  . Anemia     Planned Neupogen injections for chronic neutropenia  . Transfusion history     '06-s/p hysterectomy and staging  . Multiple thyroid nodules y-8    Managed by Dr. Harlow Asa  . GERD (gastroesophageal reflux disease)   . Polyp of duodenum y-2  . Hx of bronchitis   . Depression   . Hiatal hernia y-12    hx of small hiatal hernia   . Neutropenia     chronic   . 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 Cancer     '06-Ovarian/ Uterine Cancer-chemotherapy  . DM type 2 (diabetes mellitus, type 2) 12/14/2011  . Dysrhythmia     skipped beat with occasional coffee   . Blood dyscrasia     chronic neutropenia   . Dysuria   . Hydronephrosis of right kidney   . Radiation-induced dermatitis     contact dermatitis , radiation completed, rash only on ankles now.    Past Surgical History  Procedure Laterality Date  . Appendectomy    . Tonsillectomy    . Cholecystectomy      laparoscopic  . Eye surgery      pytosis of eyelids-child  . Colonoscopy with propofol N/A 08/21/2013    Procedure: COLONOSCOPY WITH PROPOFOL;  Surgeon: Cleotis Nipper, MD;  Location: [facility];  Service: Endoscopy;  Laterality: N/A;  . Abdominal hysterectomy  2006    staging for Ovarian cancer  . Eus N/A 10/02/2014    Procedure: LOWER ENDOSCOPIC ULTRASOUND (EUS);  Surgeon: Arta Silence, MD;  Location: [facility];  Service: Endoscopy;  Laterality: N/A;  . Robotic assisted lap vaginal hysterectomy N/A 11/19/2014    Procedure: ROBOTIC LYSIS OF ADHESIONS, CONVERTED TO LAPAROTOMY RADICAL UPPER VAGINECTOMY,LOW ANTERIOR BOWEL RESECTION, COLOSTOMY, BILATERAL URETERAL STENT PLACEMENT AND CYSTONOMY CLOSURE;  Surgeon: Everitt Amber, MD;  Location: [facility];  Service: Gynecology;  Laterality: N/A;  . Ostomy N/A 11/19/2014    Procedure: OSTOMY;  Surgeon: Michael [name], MD;  Location: [facility];  Service: General;  Laterality: N/A;  . Colostomy takedown N/A 12/04/2014    Procedure: LAPROSCOPIC LYSIS OF ADHESIONS, SPLENIC MOBILIZATION, RELOCATION OF COLOSTOMY, DEBRIDEMENT INITIAL COLOSTOMY SITE;  Surgeon: Michael [name], MD;  Location: [facility];  Service: General;  Laterality: N/A;    Family History  Problem Relation Age of Onset  . Cancer Mother     stomach ca  . Hypertension Mother   . Cancer Father     prostate ca  . Diabetes Father   . Diabetes Sister   . Hypertension Brother y-10   Social History:  reports that she has never smoked. She has never used smokeless tobacco. She reports that she drinks alcohol. She reports that she does not use illicit drugs.  Allergies:  Allergies  Allergen Reactions  . Penicillins Swelling    Facial  swelling  . Ultram [Tramadol] Hives  . Adhesive [Tape]     blisters  . Cefaclor Rash    Ceclor  . Erythromycin     Gastritis, abd cramps  . Trimethoprim Rash  . Pectin Rash    Pectin ring for stoma  . Sulfa Antibiotics Rash    No prescriptions prior to admission    Results for orders placed or performed in visit on 03/19/15 (from the past 48 hour(s))  CBC & Diff and Retic     Status: Abnormal   Collection Time: 03/19/15 11:08 AM  Result Value Ref Range   WBC 1.7 (L) 3.9 - 10.3 10e3/uL   NEUT# 0.5 (LL) 1.5 - 6.5 10e3/uL   HGB 11.5 (L) 11.6 - 15.9 g/dL   HCT 35.9 34.8 - 46.6 %   Platelets 232 145 - 400 10e3/uL   MCV 86.9 79.5 - 101.0 fL   MCH 27.8 25.1 - 34.0 pg   MCHC 32.0 31.5 - 36.0 g/dL   RBC 4.13 3.70 - 5.45 10e6/uL   RDW 18.4 (H) 11.2 - 14.5 %   lymph# 0.3 (L) 0.9 - 3.3 10e3/uL   MONO# 0.7 0.1 - 0.9 10e3/uL   Eosinophils Absolute 0.2 0.0 - 0.5 10e3/uL   Basophils Absolute 0.0 0.0 - 0.1 10e3/uL   NEUT% 29.1 (L) 38.4 - 76.8 %   LYMPH% 18.6 14.0 - 49.7 %   MONO% 39.5 (H) 0.0 - 14.0 %   EOS% 10.5 (H) 0.0 - 7.0 %   BASO% 2.3 (H) 0.0 - 2.0 %   nRBC 0 0 - 0 %   Retic % 2.19 (H) 0.70 - 2.10 %   Retic Ct Abs 90.45 33.70 - 90.70 10e3/uL   Immature Retic Fract 14.90 (H) 1.60 - 10.00 %   No results found.  Review of Systems  Constitutional: Negative.  Negative for fever and chills.  HENT: Negative.   Eyes: Negative.   Respiratory: Negative.   Cardiovascular: Negative.   Gastrointestinal: Negative.   Genitourinary: Positive for flank pain. Negative for hematuria.  Musculoskeletal: Negative.   Skin: Negative.   Neurological: Negative.   Endo/Heme/Allergies: Negative.   Psychiatric/Behavioral: Negative.     Height 5\' 2"  (1.575 m), weight 102.059 kg (225 lb). Physical Exam  Constitutional: She appears well-developed.  HENT:  Head: Normocephalic.  Eyes: Pupils are equal, round, and reactive to light.  Neck: Normal range of motion.  Cardiovascular: Normal rate.    Respiratory: Effort normal.  GI:  Obese abdomen, multiple scars well healed w/o hernias.   Genitourinary:  Mild Rt CVAT  Musculoskeletal: Normal range of motion.  Neurological: She is alert.  Skin: Skin is warm.  Psychiatric: She has a normal mood and affect. Her behavior is normal. Judgment and thought content normal.     Assessment/Plan  1 - Bladder Injury - most recent imaging suggests durable repair. Will verify at cysto  today as well.   2 - Metastatic Endometrial Cancer - per GYn oncology, medical oncology, radiation oncology  3 - Right Hydronephrosis - etiology unclear, though certainly likely stricture given h/o pelvic surgery and radiation. Discused reccommended course with cysto, retrograde, ureteroscopy with possible dilation and stent placement today for diagnostic and therapeutic intent. She is not candidate for any sort of complex reconstruction at this time with known metastatic pelvic primary cancer. She will very likely require chronic stenting.   Risks, benefits, alternatives discussed. Additional implications of neutropenia discussed.  Shavonn Convey 03/20/2015, 7:27 AM

## 2015-03-20 NOTE — Transfer of Care (Signed)
Immediate Anesthesia Transfer of Care Note  Patient: [facility]  Procedure(s) Performed: Procedure(s) (LRB): CYSTOSCOPY WITH RETROGRADE PYELOGRAM, URETEROSCOPY WITH BALLOON DILATION AND STENT PLACEMENT ON RIGHT (Right)  Patient Location: PACU  Anesthesia Type: General  Level of Consciousness: awake, oriented, sedated and patient cooperative  Airway & Oxygen Therapy: Patient Spontanous Breathing and Patient connected to face mask oxygen  Post-op Assessment: Report given to PACU RN and Post -op Vital signs reviewed and stable  Post vital signs: Reviewed and stable  Complications: No apparent anesthesia complications

## 2015-03-20 NOTE — Anesthesia Procedure Notes (Signed)
Procedure Name: LMA Insertion Date/Time: 03/20/2015 12:38 PM Performed by: Denna Haggard D Pre-anesthesia Checklist: Patient identified, Emergency Drugs available, Suction available and Patient being monitored Patient Re-evaluated:Patient Re-evaluated prior to inductionOxygen Delivery Method: Circle System Utilized Preoxygenation: Pre-oxygenation with 100% oxygen Intubation Type: IV induction Ventilation: Mask ventilation without difficulty LMA: LMA inserted LMA Size: 4.0 Number of attempts: 1 Airway Equipment and Method: Bite block Placement Confirmation: positive ETCO2 Tube secured with: Tape Dental Injury: Teeth and Oropharynx as per pre-operative assessment

## 2015-03-20 NOTE — Discharge Instructions (Signed)
1 - You may have urinary urgency (bladder spasms) and bloody urine on / off with stent in place. This is normal. ° °2 - Call MD or go to ER for fever >102, severe pain / nausea / vomiting not relieved by medications, or acute change in medical status °[facility] °[phone] °Post Ureteroscopy With or Without Stent Instructions ° °Definitions: ° °Ureter: The duct that transports urine from the kidney to the bladder. °Stent:   A plastic hollow tube that is placed into the ureter, from the kidney to the                 bladder to prevent the ureter from swelling shut. ° °GENERAL INSTRUCTIONS: ° °Despite the fact that no skin incisions were used, the area around the ureter and bladder is raw and irritated. The stent is a foreign body which will further irritate the bladder wall. This irritation is manifested by increased frequency of urination, both day and night, and by an increase in the urge to urinate. In some, the urge to urinate is present almost always. Sometimes the urge is strong enough that you may not be able to stop yourself from urinating. The only real cure is to remove the stent and then give time for the bladder wall to heal which can't be done until the danger of the ureter swelling shut has passed, which varies. ° °You may see some blood in your urine while the stent is in place and a few days afterwards. Do not be alarmed, even if the urine was clear for a while. Get off your feet and drink lots of fluids until clearing occurs. If you start to pass clots or don't improve, call us. ° °DIET: °You may return to your normal diet immediately. Because of the raw surface of your bladder, alcohol, spicy foods, acid type foods and drinks with caffeine may cause irritation or frequency and should be used in moderation. To keep your urine flowing freely and to avoid constipation, drink plenty of fluids during the day ( 8-10 glasses ). °Tip: Avoid cranberry juice because it is very  acidic. ° °ACTIVITY: °Your physical activity doesn't need to be restricted. However, if you are very active, you may see some blood in your urine. We suggest that you reduce your activity under these circumstances until the bleeding has stopped. ° °BOWELS: °It is important to keep your bowels regular during the postoperative period. Straining with bowel movements can cause bleeding. A bowel movement every other day is reasonable. Use a mild laxative if needed, such as Milk of Magnesia 2-3 tablespoons, or 2 Dulcolax tablets. Call if you continue to have problems. If you have been taking narcotics for pain, before, during or after your surgery, you may be constipated. Take a laxative if necessary. ° ° °MEDICATION: °You should resume your pre-surgery medications unless told not to. In addition you will often be given an antibiotic to prevent infection. These should be taken as prescribed until the bottles are finished unless you are having an unusual reaction to one of the drugs. ° °PROBLEMS YOU SHOULD REPORT TO US: °· Fevers over 100.5 Fahrenheit. °· Heavy bleeding, or clots ( See above notes about blood in urine ). °· Inability to urinate. °· Drug reactions ( hives, rash, nausea, vomiting, diarrhea ). °· Severe burning or pain with urination that is not improving. ° °FOLLOW-UP: °You will need a follow-up appointment to monitor your progress. Call for this appointment at the number listed above.   Usually the first appointment will be about three to fourteen days after your surgery. ° ° ° ° ° °Post Anesthesia Home Care Instructions ° °Activity: °Get plenty of rest for the remainder of the day. A responsible adult should stay with you for 24 hours following the procedure.  °For the next 24 hours, DO NOT: °-Drive a car °-Operate machinery °-Drink alcoholic beverages °-Take any medication unless instructed by your physician °-Make any legal decisions or sign important papers. ° °Meals: °Start with liquid foods such as  gelatin or soup. Progress to regular foods as tolerated. Avoid greasy, spicy, heavy foods. If nausea and/or vomiting occur, drink only clear liquids until the nausea and/or vomiting subsides. Call your physician if vomiting continues. ° °Special Instructions/Symptoms: °Your throat may feel dry or sore from the anesthesia or the breathing tube placed in your throat during surgery. If this causes discomfort, gargle with warm salt water. The discomfort should disappear within 24 hours. ° °If you had a scopolamine patch placed behind your ear for the management of post- operative nausea and/or vomiting: ° °1. The medication in the patch is effective for 72 hours, after which it should be removed.  Wrap patch in a tissue and discard in the trash. Wash hands thoroughly with soap and water. °2. You may remove the patch earlier than 72 hours if you experience unpleasant side effects which may include dry mouth, dizziness or visual disturbances. °3. Avoid touching the patch. Wash your hands with soap and water after contact with the patch. °  ° °

## 2015-03-21 ENCOUNTER — Ambulatory Visit: Payer: BLUE CROSS/BLUE SHIELD

## 2015-03-21 ENCOUNTER — Encounter (HOSPITAL_BASED_OUTPATIENT_CLINIC_OR_DEPARTMENT_OTHER): Payer: Self-pay | Admitting: Urology

## 2015-03-21 ENCOUNTER — Other Ambulatory Visit (HOSPITAL_BASED_OUTPATIENT_CLINIC_OR_DEPARTMENT_OTHER): Payer: BLUE CROSS/BLUE SHIELD

## 2015-03-21 DIAGNOSIS — D709 Neutropenia, unspecified: Secondary | ICD-10-CM

## 2015-03-21 LAB — CBC & DIFF AND RETIC
BASO%: 0.3 % (ref 0.0–2.0)
Basophils Absolute: 0 10*3/uL (ref 0.0–0.1)
EOS%: 1.5 % (ref 0.0–7.0)
Eosinophils Absolute: 0.1 10*3/uL (ref 0.0–0.5)
HCT: 34.5 % — ABNORMAL LOW (ref 34.8–46.6)
HGB: 11 g/dL — ABNORMAL LOW (ref 11.6–15.9)
Immature Retic Fract: 10.2 % — ABNORMAL HIGH (ref 1.60–10.00)
LYMPH%: 7.5 % — AB (ref 14.0–49.7)
MCH: 28.2 pg (ref 25.1–34.0)
MCHC: 31.9 g/dL (ref 31.5–36.0)
MCV: 88.5 fL (ref 79.5–101.0)
MONO#: 0.7 10*3/uL (ref 0.1–0.9)
MONO%: 10.8 % (ref 0.0–14.0)
NEUT%: 79.9 % — AB (ref 38.4–76.8)
NEUTROS ABS: 5.5 10*3/uL (ref 1.5–6.5)
PLATELETS: 184 10*3/uL (ref 145–400)
RBC: 3.9 10*6/uL (ref 3.70–5.45)
RDW: 19.1 % — ABNORMAL HIGH (ref 11.2–14.5)
Retic %: 2.02 % (ref 0.70–2.10)
Retic Ct Abs: 78.78 10*3/uL (ref 33.70–90.70)
WBC: 6.8 10*3/uL (ref 3.9–10.3)
lymph#: 0.5 10*3/uL — ABNORMAL LOW (ref 0.9–3.3)

## 2015-03-21 NOTE — Op Note (Signed)
[name], CREGO NO.:  [id number]  MEDICAL RECORD NO.:  [id number]  LOCATION:                               FACILITY:  [facility]  PHYSICIAN:  Alexis Frock, MD     DATE OF BIRTH:  1956-06-21  DATE OF PROCEDURE:  03/20/2015                              OPERATIVE REPORT  PREOPERATIVE DIAGNOSES:  Right hydronephrosis, history of metastatic endometrial cancer, status post resection and radiation.  PREOPERATIVE DIAGNOSES:  Right hydronephrosis, history of metastatic endometrial cancer, status post resection and radiation plus high-grade right distal ureteral stricture, extrinsic appearing.  ESTIMATED BLOOD LOSS:  Nil.  COMPLICATIONS:  None.  SPECIMEN:  None.  FINDINGS: 1. Mild bladder edema in the area of the right hemi-trigone consistent     with known prior cystotomy repair. 2. Moderate right hydronephrosis and ureteronephrosis to the level of     the distal fourth of the ureter. 3. High-grade approximately 2-3 segment length stricture in the distal     fourth of the ureter proximally to the distal end beginning     approximately 3 cm proximal to the right ureteral orifice. 4. Successful placement of right ureteral stent, 8-French x 24     proximal in upper pole and distal in urinary bladder.  INDICATION:  [name] is a very pleasant, but unfortunate 59 year old lady with history of metastatic endometrial carcinoma.  She is status post multiple therapies including surgery, pelvic organ resection, lymphangiectomy as well as radiation to her pelvis.  She also has history of cystotomy repair by the gynecologic team at the time of prior resection.  At which time, they placed bilateral ureteral stents.  We subsequently removed these given favorable findings on cystogram; however, the patient was noted to have right flank pain and new significant hydronephrosis on the right without ureteronephrosis, actual imaging with multiple surgical clips along the course  of the distal quarter of the ureter concerning for likely scarring and stricture in this location.  Options were discussed for management including observation alone versus nephrostomy versus endoscopic management with attempted ureteroscopy and stent placement, possible ureteral dilation, pending internal anatomy, she wished to proceed with the latter. Informed consent was obtained and placed in the medical record.  PROCEDURE IN DETAIL:  The patient being [facility], was verified. Procedure being right ureteroscopy, possible balloon dilation, stent placement was confirmed.  Procedure was carried out.  Time-out was performed.  Intravenous antibiotics were administered.  General LMA anesthesia was introduced.  The patient was placed into a low lithotomy position and sterile field was created by prepping and draping the patient's vagina, introitus, and proximal thighs using iodine x3.  Next, cystourethroscopy was performed using a 23-French rigid cystoscope with 30-degree offset lens.  Inspection of the bladder revealed no diverticula or calcifications.  There was some edema in the right hemi- trigone consistent with likely known prior cystotomy repair.  There were no obvious papillary lesions or cellular lesions worrisome for malignancy.  The left ureteral orifice was unremarkable with the looks of clear urine.  The right ureteral orifice was somewhat edematous, but easily identified.  This was cannulated with a felt with the angled-tip glidewire, over which,  a 6-French end-hole catheter was advanced to the level of distal ureter and right retrograde pyelogram was obtained.  Right retrograde pyelogram demonstrated a single right ureter with single-system right kidney.  There was moderate hydronephrosis to the level of the distal fourth of the ureter concerning for likely stricture, this appeared to be most likely extrinsic.  Multiple surgical clips were identified in this area as well.   This Glidewire was set aside as a Chiropodist.  An 8-French feeding tube was placed in the urinary bladder for pressure release.  Next, semi-rigid ureteroscopy was performed to the distal ureter alongside a separate Sensor working wire. The distal most 2-3 cm of the ureter was unremarkable, at which point, the ureter became very narrow in caliber and very white in appearance consistent with likely stricturing in this location.  It would accommodate two wires, but not two wires plus the ureteroscope. Additional retrograde pyelography was performed through the scope, which was able to quantitate the area of narrowing to approximately 2 cm or so proximal to the area of the ureteroscope tip.  It was felt that given the relatively short segment length of this that it may be amenable to dilation as such keeping the Glidewire in place as a safety wire.  The Sensor wire was used as a working wire, over which, the 15-French 4-cm in length NephroMax balloon dilation apparatus was carefully advanced across the area of narrowing in question.  Under fluoroscopic guidance, this was inflated to pressure of 26 atmospheres, held for 90 seconds. There was no obvious wasting seen of this balloon and the balloon appeared to be normal caliber across the area of previous narrowing consistent with likely successful dilation in this area.  The semi-rigid ureteroscope was once again used to attempt to traverse this area; however, despite dilation, it still was sufficiently narrow not to allow easy passage of the ureteroscope.  As such, it was felt that stenting would be warranted with large caliber stents, an 8-French was chosen as the largest readily available and this was carefully placed using fluoroscopic guidance over the Sensor working wire.  Proximal end coiled in the upper pole and distal in the urinary bladder.  This easily traversed the area of narrowing.  The safety wire was removed under fluoroscopic  guidance without dislodging of the stent.  Bladder was emptied per cystoscope.  Procedure was then terminated.  The patient tolerated the procedure well.  There were no immediate periprocedural complications.  The patient was taken to the postanesthesia care unit in stable condition.          ______________________________ Alexis Frock, MD     TM/MEDQ  D:  03/20/2015  T:  03/21/2015  Job:  [id number]

## 2015-03-21 NOTE — Progress Notes (Signed)
Labs done today ANC 5.5  will hold Granix today  Will return as scheduled

## 2015-03-26 ENCOUNTER — Encounter: Payer: Self-pay | Admitting: Gynecologic Oncology

## 2015-03-26 ENCOUNTER — Ambulatory Visit: Payer: BLUE CROSS/BLUE SHIELD | Attending: Gynecologic Oncology | Admitting: Gynecologic Oncology

## 2015-03-26 ENCOUNTER — Encounter: Payer: Self-pay | Admitting: General Practice

## 2015-03-26 VITALS — BP 114/67 | HR 70 | Temp 97.9°F | Resp 18 | Ht 62.0 in | Wt 226.5 lb

## 2015-03-26 DIAGNOSIS — C541 Malignant neoplasm of endometrium: Secondary | ICD-10-CM | POA: Diagnosis not present

## 2015-03-26 NOTE — Progress Notes (Signed)
Recurrent Endometrial cancer FOLLOWUP VISIT  Assessment:    59 y.o. year old with recurrent endometrioid endometrial/ovarian cancer.   S/p exploratory laparotomy, posterior supralevator pelvic exenteration, end colostomy, bladder repair, ureteral stenting with complete resection and negative margins on 11/09/14. 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 S/p hospital admission 12/20/14 for urosepsis  S/p adjuvant radiation completed 03/10/15  Postoperative and post-radiation right hydroureter and distal ureteral obstruction.  No convincing evidence for recurrence/persistence of tumor on imagine.  Plan: 1) Recurrent endometrial/ovarian cancer: s/p complete resection with negative margins and s/p adjuvant radiation. No chemotherapy due to chronic idiopathic neutropenia.   CT imaging post treatment (August, 2016) shows thickening of upper vagina and right peri-urethral tissues. Given that there was no macroscopic disease at the completion of surgery and on pre-radiation imaging, I believe this is most likely radiation changes and postop changes. We will repeat CT imaging in 2 months. If the "thickening" is progressive, it is more concerning for early recurrence and we would consider PET or biopsy. If stable or reducing, it is presumably radiation changes.  2) Right hydroureter and distal ureteral obstruction - secondary to postoperative scaring and post-radiation changes. Appreciate Dr Zettie Pho assistance with dilation and stenting.  3) neutropenia - Dr Alvy Bimler is managing this and we appreciate.  4) Follow-up: I will see [name] for followup prior to stents removal.  HPI:  [name] is a 59 y.o. 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  She has a history of endometrial and ovarian endometrioid carcinoma treated in 2006 by Dr. [name] at [facility] in [place]. Her surgery (TAH, BSO) was followed by adjuvant chemotherapy with carboplatin plus paclitaxel due to the ovarian involvement and the presence of a cul de sac lesion also positive for disease. She denies receiving adjuvant radiation therapy. She is unclear if she had metastatic endometrial cancer to the ovary or duel primaries. She had a complete response to therapy however developed leukopenia in July 2010. 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 She sees Dr. Alvy Bimler for this and is treated with G-CSF injections.  She began experiencing rectal pain approximately in January 2016. She also reports narrowing of caliber of the stool. She denies hematochezia. She does report approximately 3 months of vaginal spotting. She's had no specific follow-up for her gynecologic cancers in the past 4 years.  As part of workup of her rectal pain she underwent a CT scan of the abdomen and pelvis on 09/12/2014. This demonstrated a new right perirectal mass abutting the vaginal cuff measuring 3.8 x 4.9 cm. There is a limited fat plane between the mass and the rectum posteriorly. Rectal invasion could not be excluded. There were no other masses identified in the abdomen and pelvis or lymphadenopathy. There is no other evidence of metastatic disease or recurrent disease. There was no hydronephrosis. A CA-125 drawn on 09/12/2014 was normal at 10.  PET was negative for extrapelvic disease. MRI defined the lesion as a 3.5x5cm lesion to the right of the rectum at the vaginal cuff.  Colonoscopy was performed on 10/02/14 with transrectal [place] and biopsy and this revealed endometrioid adenocarcinoma. Of note, the lesion was not seen within the lumen of the rectum.   She then underwent a posterior supralevator exenteration with colostomy and bladder repair and stent placement  on 0/09/38  without complications.  Her postoperative course was uncomplicate with the exception of development of postop anemia.  Her final pathology revealed endometrioid adenocarcinoma invading the vagina and rectum with negative margins on the specimen. The margin had been close (clinically) to the right pelvic sidewall which was marked with surgical clips.  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 It was treated with IV antibiotics and then on POD 15 she was taken to the OR for laparoscopic revision of the stoma with Dr Michael [name]. Postoperatively she had wound vac and packing for her stomal wound.  A retrograde cystogram on week 4 postop confirmed an intact bladder and the foley was removed.  She initially had some voiding issues with decreased sensation to void. We tested a post void residual in May, 2016 and this revealed adequate voiding.  On 12/30/14 she was admitted to [facility] with sepsis associated with Enterobacter Cloacae UTI. This was treated with IV antibiotics and then a prolonged course of oral cipro. Imaging performed at the time of admission (a CT of the abdo/pelvis) revealed: Bilateral double-J internal ureteral stents in adequate position with persistent mild bilateral hydronephrosis   Interval Hx:   She complete radiation therapy from 01/29/15 to 03/10/15 with 50Gy of external beam radiation and IMRT. She tolerated therapy well with minor skin irritation.  She had her ureteral stents removed in June, 2016, however, then developed right ureteral obstruction, hydroureter and pain. She went to the OR with Dr Tresa Moore on 03/20/15 for ureteral dilation and right stent placement (the left was draining well). No tumor was seen on cysto.   No vaginal bleeding. Stoma working well. Gaining weight. Mood improved. Pain better after right ureteral stent placement.  Review of systems: Constitutional:  She has no weight gain or  weight loss. She has a low grade fever no chills. Eyes: No blurred vision Ears, Nose, Mouth, Throat: No dizziness, headaches or changes in hearing. No mouth sores. Cardiovascular: No chest pain, palpitations or edema. Respiratory:  No shortness of breath, wheezing or cough Gastrointestinal: She has normal bowel movements trhough stoma without diarrhea or constipation. She denies any nausea or vomiting. She denies blood in her stool or heart burn. Genitourinary:  See HPI Musculoskeletal: Denies muscle weakness or joint pains.  Skin:  She has no skin changes, rashes or itching Neurological:  Denies dizziness or headaches. No neuropathy, no numbness or tingling. Psychiatric:  She denies depression or anxiety. Hematologic/Lymphatic:   No easy bruising or bleeding  Allergies  Allergen Reactions  . Penicillins Swelling    Facial swelling  . Ultram [Tramadol] Hives  . Adhesive [Tape]     blisters  . Cefaclor Rash    Ceclor  . Erythromycin     Gastritis, abd cramps  . Trimethoprim Rash  . Pectin Rash    Pectin ring for stoma  . Sulfa Antibiotics Rash    Current Outpatient Prescriptions on File Prior to Visit  Medication Sig Dispense Refill  . Biotin 5 MG TABS Take by mouth.    . Calcium Carbonate-Vitamin D (CALCIUM + D PO) Take 2 tablets by mouth daily.    . Canagliflozin (INVOKANA) 300 MG TABS Take 1 tablet by mouth every morning.     . Cholecalciferol (VITAMIN D3) 10000 UNITS capsule Take 10,000 Units by mouth once a week. Sundays    . ferrous sulfate 325 (65 FE) MG tablet Take 1 tablet (325  mg total) by mouth 2 (two) times daily with a meal. 60 tablet 3  . fluticasone (FLOVENT HFA) 110 MCG/ACT inhaler Inhale 1 puff into the lungs 2 (two) times daily.    . hyaluronate sodium (RADIAPLEXRX) GEL Apply 1 application topically 2 (two) times daily.    [name] Kitchen levothyroxine (SYNTHROID) 175 MCG tablet Take 175 mcg by mouth daily before breakfast.    . loperamide (IMODIUM) 2 MG capsule Take by  mouth as needed for diarrhea or loose stools.    [name] Kitchen loratadine (CLARITIN) 10 MG tablet Take 10 mg by mouth daily.    . magnesium chloride (SLOW-MAG) 64 MG TBEC SR tablet Take 2 tablets (128 mg total) by mouth 2 (two) times daily. 120 tablet 0  . metFORMIN (GLUCOPHAGE) 1000 MG tablet Take 1,000 mg by mouth 2 (two) times daily with a meal.     . Multiple Vitamin (MULTIVITAMIN WITH MINERALS) TABS tablet Take 1 tablet by mouth daily.    . naproxen sodium (ANAPROX) 220 MG tablet Take 220 mg by mouth 2 (two) times daily with a meal.    . omega-3 acid ethyl esters (LOVAZA) 1 G capsule Take 1 g by mouth 2 (two) times daily.    [name] Kitchen omeprazole (PRILOSEC) 20 MG capsule Take 20 mg by mouth at bedtime.     . ondansetron (ZOFRAN) 4 MG tablet Take 1 tablet (4 mg total) by mouth every 6 (six) hours as needed for nausea. 20 tablet 0  . ONETOUCH VERIO test strip     . oxyCODONE (OXY IR/ROXICODONE) 5 MG immediate release tablet Take 1 tablet (5 mg total) by mouth every 4 (four) hours as needed for moderate pain or severe pain. 30 tablet 0  . phenazopyridine (PYRIDIUM) 200 MG tablet Take 200 mg by mouth 3 (three) times daily as needed for pain. Called in to [facility] per Dr. Sondra Come.  Disp. 30 tablets. 2 refills.    Vladimir Faster Glycol-Propyl Glycol (SYSTANE OP) Apply 1 drop to eye daily as needed (Dry eyes).     . potassium chloride SA (K-DUR,KLOR-CON) 20 MEQ tablet Take 1 tablet (20 mEq total) by mouth daily. 30 tablet 0  . rosuvastatin (CRESTOR) 10 MG tablet Take 10 mg by mouth every evening.     . saccharomyces boulardii (FLORASTOR) 250 MG capsule Take 1 capsule (250 mg total) by mouth 2 (two) times daily. 60 capsule 2  . sitaGLIPtin (JANUVIA) 100 MG tablet Take 100 mg by mouth daily.    . Tbo-Filgrastim (GRANIX) 480 MCG/0.8ML SOSY injection Inject 480 mcg into the skin once a week. _0     . valsartan (DIOVAN) 320 MG tablet Take 320 mg by mouth daily.    . Alum & Mag Hydroxide-Simeth (MAGIC MOUTHWASH) SOLN Take 5  mLs by mouth daily as needed for mouth pain (mouth pain).    [name] Kitchen diphenhydrAMINE (BENADRYL) 25 MG tablet Take 25 mg by mouth every 4 (four) hours as needed.    . diphenoxylate-atropine (LOMOTIL) 2.5-0.025 MG per tablet Take 1 tablet by mouth 4 (four) times daily as needed for diarrhea or loose stools. (Patient not taking: Reported on 03/19/2015) 60 tablet 0  . ibuprofen (ADVIL,MOTRIN) 800 MG tablet Take 800 mg by mouth every 8 (eight) hours as needed for moderate pain (pain).     No current facility-administered medications on file prior to visit.    Past Medical History  Diagnosis Date  . Hypothyroidism 12/14/2011  . Benign essential HTN 12/14/2011  . Neutropenia   . Postmenopausal bleeding  09/05/2014  . Anemia     Planned Neupogen injections for chronic neutropenia  . Transfusion history     '06-s/p hysterectomy and staging  . Multiple thyroid nodules y-8    Managed by Dr. Harlow Asa  . GERD (gastroesophageal reflux disease)   . Polyp of duodenum y-2  . Hx of bronchitis   . Depression   . Hiatal hernia y-12    hx of small hiatal hernia   . Neutropenia     chronic   . 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 Cancer     '06-Ovarian/ Uterine Cancer-chemotherapy  . DM type 2 (diabetes mellitus, type 2) 12/14/2011  . Dysrhythmia     skipped beat with occasional coffee   . Blood dyscrasia     chronic neutropenia  . Dysuria   . Hydronephrosis of right kidney   . Radiation-induced dermatitis     contact dermatitis , radiation completed, rash only on ankles now.    Past Surgical History  Procedure Laterality Date  . Appendectomy    . Tonsillectomy    . Cholecystectomy      laparoscopic  . Eye surgery      pytosis of eyelids-child  . Colonoscopy with propofol N/A 08/21/2013    Procedure: COLONOSCOPY WITH PROPOFOL;  Surgeon: Cleotis Nipper, MD;  Location: [facility];  Service: Endoscopy;  Laterality: N/A;  . Abdominal hysterectomy  2006    staging for Ovarian cancer  . Eus N/A  10/02/2014    Procedure: LOWER ENDOSCOPIC ULTRASOUND (EUS);  Surgeon: Arta Silence, MD;  Location: [facility];  Service: Endoscopy;  Laterality: N/A;  . Robotic assisted lap vaginal hysterectomy N/A 11/19/2014    Procedure: ROBOTIC LYSIS OF ADHESIONS, CONVERTED TO LAPAROTOMY RADICAL UPPER VAGINECTOMY,LOW ANTERIOR BOWEL RESECTION, COLOSTOMY, BILATERAL URETERAL STENT PLACEMENT AND CYSTONOMY CLOSURE;  Surgeon: Everitt Amber, MD;  Location: [facility];  Service: Gynecology;  Laterality: N/A;  . Ostomy N/A 11/19/2014    Procedure: OSTOMY;  Surgeon: Michael [name], MD;  Location: [facility];  Service: General;  Laterality: N/A;  . Colostomy takedown N/A 12/04/2014    Procedure: LAPROSCOPIC LYSIS OF ADHESIONS, SPLENIC MOBILIZATION, RELOCATION OF COLOSTOMY, DEBRIDEMENT INITIAL COLOSTOMY SITE;  Surgeon: Michael [name], MD;  Location: [facility];  Service: General;  Laterality: N/A;  . Cystoscopy with retrograde pyelogram, ureteroscopy and stent placement Right 03/20/2015    Procedure: CYSTOSCOPY WITH RETROGRADE PYELOGRAM, URETEROSCOPY WITH BALLOON DILATION AND STENT PLACEMENT ON RIGHT;  Surgeon: Alexis Frock, MD;  Location: [facility];  Service: Urology;  Laterality: Right;    Family History  Problem Relation Age of Onset  . Cancer Mother     stomach ca  . Hypertension Mother   . Cancer Father     prostate ca  . Diabetes Father   . Diabetes Sister   . Hypertension Brother y-10    Social History   Social History  . Marital Status: Married    Spouse Name: N/A  . Number of Children: 1  . Years of Education: N/A   Occupational History  . retired Therapist, sports from [place] History Main Topics  . Smoking status: Never Smoker   . Smokeless tobacco: Never Used  . Alcohol Use: Yes     Comment: rare social  . Drug Use: No  . Sexual Activity: Not Currently   Other Topics Concern  . Not on file   Social History Narrative   CBC    Component Value Date/Time   WBC 6.8 03/21/2015  1407   WBC 3.3*  01/02/2015 0400   WBC 2.4* 08/25/2014 1541   RBC 3.90 03/21/2015 1407   RBC 3.18* 01/02/2015 0400   RBC 3.68* 12/13/2014 1235   RBC 4.75 08/25/2014 1541   HGB 11.0* 03/21/2015 1407   HGB 12.6 03/20/2015 1200   HGB 13.0 08/25/2014 1541   HCT 34.5* 03/21/2015 1407   HCT 37.0 03/20/2015 1200   HCT 41.3 08/25/2014 1541   PLT 184 03/21/2015 1407   PLT 318 01/02/2015 0400   MCV 88.5 03/21/2015 1407   MCV 87.4 01/02/2015 0400   MCV 86.8 08/25/2014 1541   MCH 28.2 03/21/2015 1407   MCH 26.1 01/02/2015 0400   MCH 27.5 08/25/2014 1541   MCHC 31.9 03/21/2015 1407   MCHC 29.9* 01/02/2015 0400   MCHC 31.6* 08/25/2014 1541   RDW 19.1* 03/21/2015 1407   RDW 15.9* 01/02/2015 0400   LYMPHSABS 0.5* 03/21/2015 1407   LYMPHSABS 1.1 12/31/2014 0638   MONOABS 0.7 03/21/2015 1407   MONOABS 1.4* 12/31/2014 0638   EOSABS 0.1 03/21/2015 1407   EOSABS 0.0 12/31/2014 0638   BASOSABS 0.0 03/21/2015 1407   BASOSABS 0.0 12/31/2014 9311     Physical Exam: Blood pressure 114/67, pulse 70, temperature 97.9 F (36.6 C), temperature source Oral, resp. rate 18, height _0  (1.575 m), weight 226 lb 8 oz (102.74 kg), SpO2 100 %. General: Well dressed, well nourished in no apparent distress.   HEENT:  Normocephalic and atraumatic, no lesions.  Extraocular muscles intact. Sclerae anicteric. Pupils equal, round, reactive. No mouth sores or ulcers. Thyroid is normal size, not nodular, midline. Skin:  No lesions or rashes. Breasts:  deferred Lungs:  Clear to auscultation bilaterally.  No wheezes. Cardiovascular:  Regular rate and rhythm.  No murmurs or rubs. Abdomen:  Soft, nontender, nondistended.  No palpable masses.  No hepatosplenomegaly.  No ascites. Normal bowel sounds.  No hernias.  Incision is healed. Laparoscopic incision healed. Stomal site healed. Stomal appliance in situ. Genitourinary: vaginal cuff intact without blood or fluid in the vault. Some synechaie and agglutination of upper vagina. No  thickening or masses to suggest recurrence. Rectal exam deferred Extremities: No cyanosis, clubbing or edema.  No calf tenderness or erythema. No palpable cords. Psychiatric: Mood and affect are appropriate. Neurological: Awake, alert and oriented x 3. Sensation is intact, no neuropathy.  Musculoskeletal: No pain, normal strength and range of motion.  Donaciano Eva, MD

## 2015-03-26 NOTE — Progress Notes (Signed)
Spiritual Care Note  Met with Ranie in the lobby, providing pastoral check-in and reflective listening as she approaches "finding her new normal" after treatment.  She was upbeat and positive, but is still struggling with low energy: "Friends say that I look good, but they don't realize that I feel worse than I look."  She values having a compassionate witness (in this case, chaplain) as she processes more about how cancer has changed her life and attitude.  She is working on staying enjoying as much as possible while taking one day at a time.  Jennye is aware of ongoing chaplain availability and I will follow as I see her, but please also page as needs arise.  Thank you.  [place], [place] Pager [phone] Voicemail  [phone]

## 2015-03-26 NOTE — Patient Instructions (Signed)
We will call you with CT Scan results.

## 2015-03-27 ENCOUNTER — Other Ambulatory Visit: Payer: Self-pay | Admitting: Gynecologic Oncology

## 2015-03-27 ENCOUNTER — Telehealth: Payer: Self-pay | Admitting: *Deleted

## 2015-03-27 ENCOUNTER — Other Ambulatory Visit (HOSPITAL_BASED_OUTPATIENT_CLINIC_OR_DEPARTMENT_OTHER): Payer: BLUE CROSS/BLUE SHIELD

## 2015-03-27 ENCOUNTER — Ambulatory Visit (HOSPITAL_BASED_OUTPATIENT_CLINIC_OR_DEPARTMENT_OTHER): Payer: BLUE CROSS/BLUE SHIELD

## 2015-03-27 VITALS — BP 119/68 | HR 76 | Temp 98.2°F

## 2015-03-27 DIAGNOSIS — D709 Neutropenia, unspecified: Secondary | ICD-10-CM

## 2015-03-27 DIAGNOSIS — C549 Malignant neoplasm of corpus uteri, unspecified: Secondary | ICD-10-CM

## 2015-03-27 LAB — CBC & DIFF AND RETIC
BASO%: 2.8 % — AB (ref 0.0–2.0)
Basophils Absolute: 0 10*3/uL (ref 0.0–0.1)
EOS%: 12 % — ABNORMAL HIGH (ref 0.0–7.0)
Eosinophils Absolute: 0.2 10*3/uL (ref 0.0–0.5)
HCT: 35.9 % (ref 34.8–46.6)
HGB: 11.4 g/dL — ABNORMAL LOW (ref 11.6–15.9)
Immature Retic Fract: 18.1 % — ABNORMAL HIGH (ref 1.60–10.00)
LYMPH#: 0.4 10*3/uL — AB (ref 0.9–3.3)
LYMPH%: 28.9 % (ref 14.0–49.7)
MCH: 28 pg (ref 25.1–34.0)
MCHC: 31.8 g/dL (ref 31.5–36.0)
MCV: 88.2 fL (ref 79.5–101.0)
MONO#: 0.4 10*3/uL (ref 0.1–0.9)
MONO%: 30.3 % — ABNORMAL HIGH (ref 0.0–14.0)
NEUT%: 26 % — ABNORMAL LOW (ref 38.4–76.8)
NEUTROS ABS: 0.4 10*3/uL — AB (ref 1.5–6.5)
NRBC: 0 % (ref 0–0)
PLATELETS: 245 10*3/uL (ref 145–400)
RBC: 4.07 10*6/uL (ref 3.70–5.45)
RDW: 18.5 % — AB (ref 11.2–14.5)
RETIC CT ABS: 74.48 10*3/uL (ref 33.70–90.70)
Retic %: 1.83 % (ref 0.70–2.10)
WBC: 1.4 10*3/uL — AB (ref 3.9–10.3)

## 2015-03-27 MED ORDER — TBO-FILGRASTIM 480 MCG/0.8ML ~~LOC~~ SOSY
480.0000 ug | PREFILLED_SYRINGE | SUBCUTANEOUS | Status: DC
  Administered 2015-03-27: 480 ug via SUBCUTANEOUS
  Filled 2015-03-27: qty 0.8

## 2015-03-27 NOTE — Telephone Encounter (Signed)
Notified pt of Scheduled CT scan. Pt is scheduled for CT scan on 05/14/2015 at [facility] @10 :30. Pt agreed with appointment time and date.

## 2015-03-27 NOTE — Progress Notes (Signed)
BMET prior to CT scan

## 2015-03-31 ENCOUNTER — Telehealth: Payer: Self-pay | Admitting: *Deleted

## 2015-03-31 NOTE — Telephone Encounter (Signed)
Ok to reschedule appt from 9/15 to 9/19 Please place POF to reschedule

## 2015-03-31 NOTE — Telephone Encounter (Signed)
LVM for pt informing her of appts to be r/s from 9/15 to 9/19.  Expect a call from [place].  POF sent.

## 2015-03-31 NOTE — Telephone Encounter (Signed)
Voicemail from patient reporting she "will be out of town the week of April 13, 2015.  [place]. So I need a call about lab and injection for this week.  I know it is important and need to know how we'll make arrangements for this."  Routed call to collaborative voicemail.

## 2015-03-31 NOTE — Telephone Encounter (Signed)
Called patient and informed her appointments will be changed to 04-21-2015.  Reports she has an endocrinologist appointment at 0830 on 04-21-2015 and [facility] appopintment at 2:15 pm.  Can have lab at 12:00, Dr. Alvy Bimler at 1230 and injection at 1:00 to make it to [facility] by 1415.

## 2015-04-01 ENCOUNTER — Telehealth: Payer: Self-pay | Admitting: Hematology and Oncology

## 2015-04-01 NOTE — Telephone Encounter (Signed)
Per 8/30 pof moved 9/15 appointments for 9/19. S/w patient she is aware.

## 2015-04-03 ENCOUNTER — Other Ambulatory Visit (HOSPITAL_BASED_OUTPATIENT_CLINIC_OR_DEPARTMENT_OTHER): Payer: BLUE CROSS/BLUE SHIELD

## 2015-04-03 ENCOUNTER — Ambulatory Visit (HOSPITAL_BASED_OUTPATIENT_CLINIC_OR_DEPARTMENT_OTHER): Payer: BLUE CROSS/BLUE SHIELD

## 2015-04-03 VITALS — BP 102/44 | HR 72 | Temp 98.4°F

## 2015-04-03 DIAGNOSIS — D709 Neutropenia, unspecified: Secondary | ICD-10-CM

## 2015-04-03 LAB — CBC & DIFF AND RETIC
BASO%: 3.8 % — AB (ref 0.0–2.0)
Basophils Absolute: 0.1 10*3/uL (ref 0.0–0.1)
EOS%: 12 % — AB (ref 0.0–7.0)
Eosinophils Absolute: 0.2 10*3/uL (ref 0.0–0.5)
HEMATOCRIT: 35.7 % (ref 34.8–46.6)
HGB: 11.2 g/dL — ABNORMAL LOW (ref 11.6–15.9)
Immature Retic Fract: 13.7 % — ABNORMAL HIGH (ref 1.60–10.00)
LYMPH#: 0.4 10*3/uL — AB (ref 0.9–3.3)
LYMPH%: 32.3 % (ref 14.0–49.7)
MCH: 28 pg (ref 25.1–34.0)
MCHC: 31.4 g/dL — AB (ref 31.5–36.0)
MCV: 89.3 fL (ref 79.5–101.0)
MONO#: 0.5 10*3/uL (ref 0.1–0.9)
MONO%: 39.1 % — ABNORMAL HIGH (ref 0.0–14.0)
NEUT%: 12.8 % — AB (ref 38.4–76.8)
NEUTROS ABS: 0.2 10*3/uL — AB (ref 1.5–6.5)
PLATELETS: 261 10*3/uL (ref 145–400)
RBC: 4 10*6/uL (ref 3.70–5.45)
RDW: 18 % — ABNORMAL HIGH (ref 11.2–14.5)
Retic %: 1.96 % (ref 0.70–2.10)
Retic Ct Abs: 78.4 10*3/uL (ref 33.70–90.70)
WBC: 1.3 10*3/uL — AB (ref 3.9–10.3)
nRBC: 0 % (ref 0–0)

## 2015-04-03 MED ORDER — TBO-FILGRASTIM 480 MCG/0.8ML ~~LOC~~ SOSY
480.0000 ug | PREFILLED_SYRINGE | SUBCUTANEOUS | Status: DC
  Administered 2015-04-03: 480 ug via SUBCUTANEOUS
  Filled 2015-04-03: qty 0.8

## 2015-04-03 NOTE — Patient Instructions (Signed)
Food Safety for the Immunocompromised Person °Food safety is important for people who are immunocompromised. Immunocompromised means that the immune system is impaired or weakened (as by drugs or illness). This diet is often recommended before and after certain cancer treatments and after an organ or bone marrow transplant. It is important to follow these food safety guidelines for as long as your dietitian or caregiver instructs you to. These food safety guidelines are sometimes called the neutropenic diet or low-microbial diet. °Bacteria and other harmful microorganisms are more likely to be present in raw or fresh foods. Thoroughly cooking foods destroys these microorganisms. For example, fresh vegetables should be cooked until tender; meats should be cooked until well-done; and eggs should be cooked until the yolks are firm. Also, certain food products are treated with a method known as pasteurization. Pasteurization briefly exposes food to high heat that kills any bacteria. Look for dairy products, juices, and ciders that have the word "pasteurized" on the label.  °GENERAL GUIDELINES °· Check expiration dates on all products before you buy them. Nothing you buy should be past the expiration date. °· Wash the following items with soap and hot water before and after touching food: °¨ Countertops. °¨ Cutting boards (wash these in a dishwasher if you have one). °¨ All cooking utensils. °¨ All silverware. °¨ All pots and pans. °· Before preparing food, wash your hands frequently with warm, soapy water and dry your hands with paper towels. This is especially important after touching raw meat, eggs, and fish. °· Wash dishes in hot, soapy water or in a dishwasher. Air dry dishes. Do not use a cloth towel. °· Keep perishable food very hot or very cold. Do not leave perishable items at room temperature for more than 10-15 minutes. °· All perishable foods should be cooked thoroughly. No rare meat should be eaten. °· Wash  fruits and vegetables thoroughly under cold running water before peeling or cutting. Individually scrub produce that has a thick, rough skin or rind, such as lettuce, spinach, or cabbage. Do not use commercial rinses to wash fruits and vegetables.   °· Packaged salads, slaw mix, and other prepared produce (even marked "prewashed") should be rinsed again under cold running water.   °· If consuming unpasteurized or fresh tofu, cut tofu into 1 inch (or smaller) cubes. Then, boil the tofu for at least 5 minutes in water or broth before eating or using the tofu in recipes. °· Use distilled or bottled water if you are using a water service other than the city water service. °· Thaw frozen foods in the refrigerator overnight or quickly in the microwave. Do not thaw food on the countertop. °· Refrigerate leftovers promptly in airtight containers. °· Use leftovers only if they have been stored properly and have been around for no more than 24 hours. °· When food shopping, avoid salad bars, bulk food bins, food samples, and snacks that are out in the open. °· When dining out: °¨ Avoid salad bars, delis, and buffets. °¨ Use single-serve condiments, such as ketchup, mustard, mayonnaise, soy sauce, steak sauce, salt, pepper, and sugar. °Check with your caregiver after blood work is done to see when your neutropenic diet can be modified with fewer restrictions. °SPECIFIC EXAMPLE GUIDELINES °Beverages  °· Allowed: Boiled well water. Bottled spring, distilled, and natural water. Tap water and ice made from bottled or tap water. All canned, bottled, powdered beverages. Instant and brewed coffee, tea; cold-brewed tea made with boiling water. Brewed herbal teas using commercially-packaged tea bags.   Liquid and powdered commercial nutritional supplements. Other beverages not listed below.   °· Avoid: Unboiled well water. Cold-brewed tea made with warm or cold water. Raw, unpasteurized milk. Unpasteurized fruit and vegetable juices. Maté  tea. Eggnog or milkshakes made with raw eggs. Fresh apple cider.   °Meat, Fish, Eggs, Poultry  °· Allowed: All thoroughly-cooked or canned meats: beef, pork, lamb, poultry, fish, shellfish, game, ham, bacon, sausage, hot dogs. Thoroughly cooked pasteurized egg substitutes and eggs (egg white cooked firm with thickened yellow yolk acceptable). Commercially packaged salami, bologna, and other luncheon meats. Hot dogs should be heated until steaming (165°F [73.9°C]). Cooked tofu. Prepackaged peanut butter. °· Avoid: Uncooked or rare meat, fish, eggs, or poultry. Commercially prepared meat and fish salads. Sushi. Raw or undercooked meat, poultry, fish, game, tofu. Raw or undercooked eggs and egg substitutes. Unheated meats and cold cuts from the deli. Hard cured salami in natural wrap. Cold-smoked salmon, lox. Pickled fish. [place] products. °Dairy Products  °· Allowed: Pasteurized, grade "A" milk or milk products or lactose-free milk or yogurt. Pasteurized yogurt or frozen yogurt. Prepackaged ice cream, sherbet, ice cream bars, homemade milkshakes. Prepackaged and pasteurized hard cheeses, such as cheddar, [place], Monterey Jack, or Swiss. Prepackaged soft cheeses, such as cottage cheese, cream cheese, or ricotta. Dry, refrigerated, and frozen pasteurized whipped topping. Commercial nutritional supplements. Pasteurized eggnog. Pasteurized sour cream. °· Avoid: Soft-serve ice cream or frozen yogurt. Hand-packed ice cream or frozen yogurt. Feta, brie, camembert, blue, gorgonzola, Stilton, Roquefort, farmer's cheese, and queso fresco cheeses. Any imported cheeses and any cheese sliced at a deli. Unpasteurized or raw milk cheese, yogurt, and other milk products. Cheeses containing chili peppers or other uncooked vegetables. °Breads, Cereals, Rice, Potatoes, Pasta  °· Allowed: All prepackaged or homemade breads, bagels, rolls, pancakes, sweet rolls, waffles, French toast, muffins, cakes, donuts, cookies, crackers. All boxed hot  or cold cereals. Cooked potatoes, rice, noodles, other grains. Potato chips, corn chips, tortilla chips, pretzels, popcorn. °· Avoid: Fresh bakery breads, muffins, cakes, donuts, cream or custard filled cakes. Raw grain products. °Vegetables and Fruits  °· Allowed: All frozen, canned, and washed raw vegetables that have been cooked. All cooked or canned fruits. Raw, well-washed and non-bruised fruits. Pasteurized fruit juices. Canned and stewed fruit. Dried fruits. °· Avoid: Unwashed raw vegetables and salads. All raw vegetable sprouts (alfalfa, radish, broccoli, mung bean, all others). Salads from the deli. Prepackaged salsas stored in refrigerated case. Unwashed raw fruits. Unpasteurized fruit and vegetable juices. °Nuts  °· Allowed: Processed peanut butter. Canned or bottled roasted nuts. Nuts in baked products. °· Avoid: Unroasted raw nuts. Unprocessed nuts. Roasted nuts in the shell. °Condiments and Spices  °· Allowed: All cooked, fresh, or canned spices (add at least 5 minutes before cooking ends). Thoroughly washed fresh herbs and spices. Ketchup, mustard, BBQ sauce, soy sauce, and mayonnaise served in separate containers with clean utensils, refrigerated after opening. Sugar, jelly, and honey served from clean containers with clean utensils.   °· Avoid: Uncooked spices. Raw honey. Anything from a family container that is not freshly washed.   °Desserts °· Allowed: Refrigerated commercial and homemade cakes, pies, pastries, and pudding. Refrigerated cream-filled pastries. Homemade and commercial cookies. Shelf-stable cream-filled cupcakes, fruit pies, and canned pudding. Ices, popsicle-like products. °· Avoid: Unrefrigerated, cream-filled pastry products (not shelf-stable). °Fats °· Allowed: Oil, shortening, refrigerated lard, margarine, butter. Commercial or shelf-stable mayonnaise and salad dressings (including cheese-based salad dressings, refrigerated after opening). Cooked gravy and sauces. °· Avoid:  Fresh salad dressings containing aged cheese (blue cheese, Roquefort) or raw eggs, stored in refrigerated case. °Restaurant Foods and Miscellaneous  °·   Allowed: Thoroughly cooked frozen dinners. Thoroughly cooked frozen pizza. Canned entrees.   °· Avoid: Eating at restaurants while in neutropenia or using take-out deli food even if it is behind the counter. Avoid all salad bars while you are neutropenic. Avoid all self-serve buffets while you are neutropenic.   °Ask your caregiver for information or recommendations regarding poor appetite and weight loss during cancer treatment if needed.  °Document Released: 05/16/2007 Document Revised: 01/18/2012 Document Reviewed: 12/03/2011 °ExitCare® Patient Information ©2015 [employer]. This information is not intended to replace advice given to you by your health care provider. Make sure you discuss any questions you have with your health care provider. ° °

## 2015-04-03 NOTE — Progress Notes (Signed)
ANC 0.2 today Granix injection given as ordered.  Discussed Neutropenic precautions and masks given.

## 2015-04-04 ENCOUNTER — Encounter: Payer: Self-pay | Admitting: Oncology

## 2015-04-10 ENCOUNTER — Ambulatory Visit: Payer: BLUE CROSS/BLUE SHIELD | Admitting: Radiation Oncology

## 2015-04-10 ENCOUNTER — Other Ambulatory Visit (HOSPITAL_BASED_OUTPATIENT_CLINIC_OR_DEPARTMENT_OTHER): Payer: BLUE CROSS/BLUE SHIELD

## 2015-04-10 ENCOUNTER — Ambulatory Visit (HOSPITAL_BASED_OUTPATIENT_CLINIC_OR_DEPARTMENT_OTHER): Payer: BLUE CROSS/BLUE SHIELD

## 2015-04-10 VITALS — BP 95/53 | HR 78 | Temp 98.4°F

## 2015-04-10 DIAGNOSIS — D709 Neutropenia, unspecified: Secondary | ICD-10-CM

## 2015-04-10 DIAGNOSIS — Z23 Encounter for immunization: Secondary | ICD-10-CM

## 2015-04-10 LAB — CBC & DIFF AND RETIC
BASO%: 2 % (ref 0.0–2.0)
Basophils Absolute: 0 10*3/uL (ref 0.0–0.1)
EOS ABS: 0.1 10*3/uL (ref 0.0–0.5)
EOS%: 8.8 % — AB (ref 0.0–7.0)
HCT: 36.2 % (ref 34.8–46.6)
HGB: 11.4 g/dL — ABNORMAL LOW (ref 11.6–15.9)
IMMATURE RETIC FRACT: 12.6 % — AB (ref 1.60–10.00)
LYMPH%: 31.8 % (ref 14.0–49.7)
MCH: 27.7 pg (ref 25.1–34.0)
MCHC: 31.5 g/dL (ref 31.5–36.0)
MCV: 87.9 fL (ref 79.5–101.0)
MONO#: 0.6 10*3/uL (ref 0.1–0.9)
MONO%: 38.5 % — AB (ref 0.0–14.0)
NEUT%: 18.9 % — ABNORMAL LOW (ref 38.4–76.8)
NEUTROS ABS: 0.3 10*3/uL — AB (ref 1.5–6.5)
NRBC: 0 % (ref 0–0)
PLATELETS: 252 10*3/uL (ref 145–400)
RBC: 4.12 10*6/uL (ref 3.70–5.45)
RDW: 17.3 % — AB (ref 11.2–14.5)
Retic %: 1.82 % (ref 0.70–2.10)
Retic Ct Abs: 74.98 10*3/uL (ref 33.70–90.70)
WBC: 1.5 10*3/uL — AB (ref 3.9–10.3)
lymph#: 0.5 10*3/uL — ABNORMAL LOW (ref 0.9–3.3)

## 2015-04-10 MED ORDER — INFLUENZA VAC SPLIT QUAD 0.5 ML IM SUSY
0.5000 mL | PREFILLED_SYRINGE | Freq: Once | INTRAMUSCULAR | Status: AC
  Administered 2015-04-10: 0.5 mL via INTRAMUSCULAR
  Filled 2015-04-10: qty 0.5

## 2015-04-10 MED ORDER — TBO-FILGRASTIM 480 MCG/0.8ML ~~LOC~~ SOSY
480.0000 ug | PREFILLED_SYRINGE | SUBCUTANEOUS | Status: DC
  Administered 2015-04-10: 480 ug via SUBCUTANEOUS
  Filled 2015-04-10: qty 0.8

## 2015-04-17 ENCOUNTER — Other Ambulatory Visit: Payer: BLUE CROSS/BLUE SHIELD

## 2015-04-17 ENCOUNTER — Ambulatory Visit: Payer: BLUE CROSS/BLUE SHIELD | Admitting: Hematology and Oncology

## 2015-04-17 ENCOUNTER — Ambulatory Visit: Payer: BLUE CROSS/BLUE SHIELD

## 2015-04-21 ENCOUNTER — Ambulatory Visit (HOSPITAL_BASED_OUTPATIENT_CLINIC_OR_DEPARTMENT_OTHER): Payer: BLUE CROSS/BLUE SHIELD | Admitting: Hematology and Oncology

## 2015-04-21 ENCOUNTER — Other Ambulatory Visit (HOSPITAL_BASED_OUTPATIENT_CLINIC_OR_DEPARTMENT_OTHER): Payer: BLUE CROSS/BLUE SHIELD

## 2015-04-21 ENCOUNTER — Telehealth: Payer: Self-pay | Admitting: *Deleted

## 2015-04-21 ENCOUNTER — Ambulatory Visit (HOSPITAL_BASED_OUTPATIENT_CLINIC_OR_DEPARTMENT_OTHER): Payer: BLUE CROSS/BLUE SHIELD

## 2015-04-21 ENCOUNTER — Encounter: Payer: Self-pay | Admitting: Hematology and Oncology

## 2015-04-21 ENCOUNTER — Telehealth: Payer: Self-pay | Admitting: Hematology and Oncology

## 2015-04-21 VITALS — BP 120/52 | HR 76 | Temp 98.1°F | Resp 17 | Ht 62.0 in | Wt 230.0 lb

## 2015-04-21 DIAGNOSIS — D709 Neutropenia, unspecified: Secondary | ICD-10-CM

## 2015-04-21 DIAGNOSIS — Z23 Encounter for immunization: Secondary | ICD-10-CM

## 2015-04-21 DIAGNOSIS — C549 Malignant neoplasm of corpus uteri, unspecified: Secondary | ICD-10-CM | POA: Diagnosis not present

## 2015-04-21 LAB — BASIC METABOLIC PANEL (CC13)
Anion Gap: 8 mEq/L (ref 3–11)
BUN: 16.7 mg/dL (ref 7.0–26.0)
CALCIUM: 9.6 mg/dL (ref 8.4–10.4)
CHLORIDE: 106 meq/L (ref 98–109)
CO2: 28 meq/L (ref 22–29)
CREATININE: 0.8 mg/dL (ref 0.6–1.1)
EGFR: 86 mL/min/{1.73_m2} — ABNORMAL LOW (ref >90)
Glucose: 110 mg/dl (ref 70–140)
Potassium: 4 mEq/L (ref 3.5–5.1)
Sodium: 142 mEq/L (ref 136–145)

## 2015-04-21 LAB — CBC & DIFF AND RETIC
BASO%: 2.8 % — ABNORMAL HIGH (ref 0.0–2.0)
BASOS ABS: 0 10*3/uL (ref 0.0–0.1)
EOS ABS: 0.1 10*3/uL (ref 0.0–0.5)
EOS%: 8.5 % — AB (ref 0.0–7.0)
HCT: 35.5 % (ref 34.8–46.6)
HEMOGLOBIN: 11.1 g/dL — AB (ref 11.6–15.9)
IMMATURE RETIC FRACT: 9.8 % (ref 1.60–10.00)
LYMPH%: 33.1 % (ref 14.0–49.7)
MCH: 27.8 pg (ref 25.1–34.0)
MCHC: 31.3 g/dL — ABNORMAL LOW (ref 31.5–36.0)
MCV: 88.8 fL (ref 79.5–101.0)
MONO#: 0.3 10*3/uL (ref 0.1–0.9)
MONO%: 21.1 % — AB (ref 0.0–14.0)
NEUT%: 34.5 % — ABNORMAL LOW (ref 38.4–76.8)
NEUTROS ABS: 0.5 10*3/uL — AB (ref 1.5–6.5)
NRBC: 0 % (ref 0–0)
PLATELETS: 259 10*3/uL (ref 145–400)
RBC: 4 10*6/uL (ref 3.70–5.45)
RDW: 17.3 % — AB (ref 11.2–14.5)
Retic %: 1.83 % (ref 0.70–2.10)
Retic Ct Abs: 73.2 10*3/uL (ref 33.70–90.70)
WBC: 1.4 10*3/uL — AB (ref 3.9–10.3)
lymph#: 0.5 10*3/uL — ABNORMAL LOW (ref 0.9–3.3)

## 2015-04-21 MED ORDER — FILGRASTIM 480 MCG/1.6ML IJ SOLN: 480.0000 ug | INTRAMUSCULAR | Status: DC

## 2015-04-21 MED ORDER — PNEUMOCOCCAL 13-VAL CONJ VACC IM SUSP
0.5000 mL | INTRAMUSCULAR | Status: AC
Start: 2015-04-22 — End: 2015-04-21
  Administered 2015-04-21: 0.5 mL via INTRAMUSCULAR
  Filled 2015-04-21: qty 0.5

## 2015-04-21 MED ORDER — TBO-FILGRASTIM 480 MCG/0.8ML ~~LOC~~ SOSY
480.0000 ug | PREFILLED_SYRINGE | SUBCUTANEOUS | Status: DC
  Administered 2015-04-21: 480 ug via SUBCUTANEOUS
  Filled 2015-04-21: qty 0.8

## 2015-04-21 NOTE — Assessment & Plan Note (Signed)
The patient has completed her radiation treatment. She had recent stent placement for hydronephrosis. Due to her history of recurrent infection, I recommend giving her G-CSF weekly for now. The goal would be to keep the [place] greater than 1500 to reduce the risk of infection. I will try to see if her insurance will allow her to give herself injection of G-CSF at home and that would reduce her frequent travel. We also discussed vaccination program. We will proceed with Prevnar13 injection. There is no contraindication for her husband to receive prophylactic injection against varicella infection.

## 2015-04-21 NOTE — Progress Notes (Signed)
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 Going to the patient, initial diagnosis with ovarian cancer was when she presented with abdomen and no swelling and pelvic pain leading to her urgent care visit. She states that the tumor had caused torsion of the ovary. She cannot remember the stage of her disease. She began to develop leukopenia in approximately July of 2010. She underwent an initial bone marrow biopsy at [facility] in [place] on 03/17/2009. but it was nondiagnostic. No gross dysplastic changes, no excess blasts, normal cytogenetics.  Her counts have been stable over observation through this office since December 2010 with some minor fluctuations. Total white counts run as low as 1800 and as high as 2900. Percent neutrophils fluctuates very widely from as low as 4% recorded on a CBC done 02/03/2010 to as high as 31% with average being 20%. She was hospitalized in the ICU at that time  Both hemoglobin and platelets have remained stable over time.  Despite leukopenia, she has not had any recurrent or severe infections. She stated strong family history of lupus in a cousin. She complained of occasional skin rash and joint stiffness. 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 She received G-CSF in January of 2015  to stimulate white blood cell production around a colonoscopy procedure a few years ago. She had a limited response. On 01/23/2014, repeat bone marrow biopsy excluded lymphoma or leukemia. On 09/12/2014, CT scan of the abdomen and pelvis show possible relapse of gynecological Malignancy On 10/14/2014, MRI of the pelvis show significant pelvic mass without invasion into the bladder but abutting to the rectum On 11/19/2014, she underwent surgery and had robotic-assisted lysis of adhesions, converted to laparotomy, radical upper vaginectomy and low anterior resection with colostomy. Bilateral ureteral stent placement and cystotomy repair. 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 Resection margins were negative. She was hospitalized from 11/19/2014 to 11/25/2014. In May, showed recurrent admission to the hospital due to complication with infection. On 01/29/2015, she began adjuvant radiation treatment On 03/03/2015, CT scan of the abdomen and pelvis showed status post interval removal of the bilateral nephroureteral stents. Worsening moderate right hydroureteronephrosis. Resolved left hydroureteronephrosis. On 03/20/2015, she had cystoscopy and stent placement for right hydronephrosis  INTERVAL HISTORY: Please see below for problem oriented charting. She is doing well. Denies recent infection. Her energy level has improved. The patient denies any recent signs or symptoms of bleeding such as spontaneous epistaxis, hematuria or hematochezia.   REVIEW OF SYSTEMS:   Constitutional: Denies fevers, chills or abnormal weight loss Eyes: Denies blurriness of vision Ears, nose, mouth, throat, and face: Denies mucositis or sore throat Respiratory: Denies cough, dyspnea or wheezes Cardiovascular: Denies palpitation, chest discomfort or lower extremity swelling Gastrointestinal:  Denies nausea,  heartburn or change in bowel habits Skin: Denies abnormal skin rashes Lymphatics: Denies new lymphadenopathy or easy bruising Neurological:Denies numbness, tingling or new weaknesses  Behavioral/Psych: Mood is stable, no new changes  All other systems were reviewed with the patient and are negative.  I have reviewed the past medical history, past surgical history, social history and family history with the patient and they are unchanged from previous note.  ALLERGIES:  is allergic to penicillins; ultram; adhesive; cefaclor; erythromycin; trimethoprim; pectin; and sulfa antibiotics.  MEDICATIONS:  Current Outpatient Prescriptions  Medication Sig Dispense Refill  . Biotin 5 MG TABS Take by mouth.    . Calcium Carbonate-Vitamin D (CALCIUM + D PO) Take 2 tablets by mouth daily.    . Canagliflozin (INVOKANA) 300 MG TABS Take 1 tablet by mouth every morning.     . Cholecalciferol (VITAMIN D3) 10000 UNITS capsule Take 10,000 Units by mouth once a week. Sundays    . diphenhydrAMINE (BENADRYL) 25 MG tablet Take 25 mg by mouth every 4 (four) hours as needed.    . hyaluronate sodium (RADIAPLEXRX) GEL Apply 1 application topically 2 (two) times daily.    [name] Kitchen levothyroxine (SYNTHROID) 175 MCG tablet Take 175 mcg by mouth daily before breakfast.    . loratadine (CLARITIN) 10 MG tablet Take 10 mg by mouth daily.    . metFORMIN (GLUCOPHAGE) 1000 MG tablet Take 1,000 mg by mouth 2 (two) times daily with a meal.     . Multiple Vitamin (MULTIVITAMIN WITH MINERALS) TABS tablet Take 1 tablet by mouth daily.    [name] Kitchen omega-3 acid ethyl esters (LOVAZA) 1 G capsule Take 1 g by mouth 2 (two) times daily.    [name] Kitchen omeprazole (PRILOSEC) 20 MG capsule Take 20 mg by mouth at bedtime.     Glory Rosebush VERIO test strip     . Polyethyl Glycol-Propyl Glycol (SYSTANE OP) Apply 1 drop to eye daily as needed (Dry eyes).     . rosuvastatin (CRESTOR) 10 MG tablet Take 10 mg by mouth every evening.     . sitaGLIPtin (JANUVIA) 100 MG  tablet Take 100 mg by mouth daily.    . Tbo-Filgrastim (GRANIX) 480 MCG/0.8ML SOSY injection Inject 480 mcg into the skin once a week. _0     . valsartan (DIOVAN) 320 MG tablet Take 320 mg by mouth daily.    . Alum & Mag Hydroxide-Simeth (MAGIC MOUTHWASH) SOLN Take 5 mLs by mouth daily as needed for mouth pain (mouth pain).    [name] Kitchen diphenoxylate-atropine (LOMOTIL) 2.5-0.025 MG per tablet Take 1 tablet by mouth 4 (four) times daily as needed for diarrhea or loose stools. (Patient not taking: Reported on 03/19/2015) 60 tablet 0  . ferrous sulfate 325 (65 FE) MG tablet Take 1 tablet (325 mg total) by mouth 2 (two) times daily with a meal. (Patient not taking: Reported on 04/21/2015) 60 tablet 3  . filgrastim (NEUPOGEN) 480 MCG/1.6ML injection Inject 1.6 mLs (480 mcg total) into the skin once a week. 1.6 mL 12  . fluticasone (FLOVENT HFA) 110 MCG/ACT inhaler Inhale 1 puff into the lungs 2 (two) times daily.    [name] Kitchen ibuprofen (ADVIL,MOTRIN) 800 MG tablet Take 800 mg by mouth every 8 (eight) hours as needed for moderate pain (pain).    [name] Kitchen loperamide (IMODIUM) 2 MG capsule Take by mouth as needed for diarrhea or loose stools.    . magnesium chloride (SLOW-MAG) 64 MG TBEC SR tablet Take 2 tablets (128 mg total) by mouth 2 (two) times daily. (Patient not taking: Reported on 04/21/2015) 120 tablet 0  . naproxen sodium (ANAPROX) 220 MG tablet Take 220 mg by mouth 2 (two) times  daily with a meal.    . ondansetron (ZOFRAN) 4 MG tablet Take 1 tablet (4 mg total) by mouth every 6 (six) hours as needed for nausea. (Patient not taking: Reported on 04/21/2015) 20 tablet 0  . oxyCODONE (OXY IR/ROXICODONE) 5 MG immediate release tablet Take 1 tablet (5 mg total) by mouth every 4 (four) hours as needed for moderate pain or severe pain. (Patient not taking: Reported on 04/21/2015) 30 tablet 0  . phenazopyridine (PYRIDIUM) 200 MG tablet Take 200 mg by mouth 3 (three) times daily as needed for pain. Called in to [facility] per Dr.  Sondra Come.  Disp. 30 tablets. 2 refills.    . potassium chloride SA (K-DUR,KLOR-CON) 20 MEQ tablet Take 1 tablet (20 mEq total) by mouth daily. (Patient not taking: Reported on 04/21/2015) 30 tablet 0  . saccharomyces boulardii (FLORASTOR) 250 MG capsule Take 1 capsule (250 mg total) by mouth 2 (two) times daily. (Patient not taking: Reported on 04/21/2015) 60 capsule 2   No current facility-administered medications for this visit.   Facility-Administered Medications Ordered in Other Visits  Medication Dose Route Frequency Janisa Labus Last Rate Last Dose  . Tbo-Filgrastim (GRANIX) injection 480 mcg  480 mcg Subcutaneous Weekly Heath Lark, MD   480 mcg at 04/21/15 1239    PHYSICAL EXAMINATION: ECOG PERFORMANCE STATUS: 0 - Asymptomatic  Filed Vitals:   04/21/15 1148  BP: 120/52  Pulse: 76  Temp: 98.1 F (36.7 C)  Resp: 17   Filed Weights   04/21/15 1148  Weight: 230 lb (104.327 kg)    GENERAL:alert, no distress and comfortable SKIN: skin color, texture, turgor are normal, no rashes or significant lesions EYES: normal, Conjunctiva are pink and non-injected, sclera clear Musculoskeletal:no cyanosis of digits and no clubbing  NEURO: alert & oriented x 3 with fluent speech, no focal motor/sensory deficits  LABORATORY DATA:  I have reviewed the data as listed    Component Value Date/Time   NA 142 04/21/2015 1133   NA 140 03/20/2015 1200   K 4.0 04/21/2015 1133   K 3.7 03/20/2015 1200   CL 101 03/20/2015 1200   CO2 28 04/21/2015 1133   CO2 24 01/06/2015 1610   GLUCOSE 110 04/21/2015 1133   GLUCOSE 108* 03/20/2015 1200   BUN 16.7 04/21/2015 1133   BUN 25* 03/20/2015 1200   CREATININE 0.8 04/21/2015 1133   CREATININE 0.90 03/20/2015 1200   CREATININE 0.80 01/06/2015 1610   CALCIUM 9.6 04/21/2015 1133   CALCIUM 9.6 01/06/2015 1610   PROT 6.7 12/31/2014 0638   PROT 6.4 11/29/2014 1251   ALBUMIN 2.8* 12/31/2014 0638   ALBUMIN 2.6* 11/29/2014 1251   AST 11* 12/31/2014 0638   AST  15 11/29/2014 1251   ALT 12* 12/31/2014 0638   ALT 17 11/29/2014 1251   ALKPHOS 57 12/31/2014 0638   ALKPHOS 77 11/29/2014 1251   BILITOT 0.6 12/31/2014 0638   BILITOT 0.34 11/29/2014 1251   GFRNONAA >60 01/02/2015 0400   GFRNONAA 78 12/16/2014 1530   GFRAA >60 01/02/2015 0400   GFRAA >89 12/16/2014 1530    No results found for: SPEP, UPEP  Lab Results  Component Value Date   WBC 1.4* 04/21/2015   NEUTROABS 0.5* 04/21/2015   HGB 11.1* 04/21/2015   HCT 35.5 04/21/2015   MCV 88.8 04/21/2015   PLT 259 04/21/2015      Chemistry      Component Value Date/Time   NA 142 04/21/2015 1133   NA 140 03/20/2015 1200   K  4.0 04/21/2015 1133   K 3.7 03/20/2015 1200   CL 101 03/20/2015 1200   CO2 28 04/21/2015 1133   CO2 24 01/06/2015 1610   BUN 16.7 04/21/2015 1133   BUN 25* 03/20/2015 1200   CREATININE 0.8 04/21/2015 1133   CREATININE 0.90 03/20/2015 1200   CREATININE 0.80 01/06/2015 1610      Component Value Date/Time   CALCIUM 9.6 04/21/2015 1133   CALCIUM 9.6 01/06/2015 1610   ALKPHOS 57 12/31/2014 0638   ALKPHOS 77 11/29/2014 1251   AST 11* 12/31/2014 0638   AST 15 11/29/2014 1251   ALT 12* 12/31/2014 0638   ALT 17 11/29/2014 1251   BILITOT 0.6 12/31/2014 0638   BILITOT 0.34 11/29/2014 1251      ASSESSMENT & PLAN:  Chronic neutropenia The patient has completed her radiation treatment. She had recent stent placement for hydronephrosis. Due to her history of recurrent infection, I recommend giving her G-CSF weekly for now. The goal would be to keep the [place] greater than 1500 to reduce the risk of infection. I will try to see if her insurance will allow her to give herself injection of G-CSF at home and that would reduce her frequent travel. We also discussed vaccination program. We will proceed with Prevnar13 injection. There is no contraindication for her husband to receive prophylactic injection against varicella infection.   Cancer of corpus uteri, except  isthmus I will defer to her surgical oncologist and radiation oncologist for further management. She is doing well with radiation and has completed all adjuvant treatment CT scan is scheduled for next month and I will see her after result is available.       No orders of the defined types were placed in this encounter.   All questions were answered. The patient knows to call the clinic with any problems, questions or concerns. No barriers to learning was detected. I spent 15 minutes counseling the patient face to face. The total time spent in the appointment was 20 minutes and more than 50% was on counseling and review of test results     [facility], [place], MD 04/21/2015 2:35 PM

## 2015-04-21 NOTE — Telephone Encounter (Signed)
Gave and printed appt sched and avs for pt for Sept and OCT °

## 2015-04-21 NOTE — Telephone Encounter (Signed)
-----   Message from Heath Lark, MD sent at 04/21/2015  1:52 PM EDT ----- Regarding: FW: shingles vaccine Can you call patient and let her know it is OK that her husband gets shingles vaccine and monitor for rash. If he develops rash, no contact with patient for 14 days ----- Message -----    From: Shirlean Mylar, RPH    Sent: 04/21/2015   1:21 PM      To: Heath Lark, MD Subject: RE: shingles vaccine                           Should not be a problem for the husband to receive it. Just need to make sure a post vaccine rash does not develop.   This is from the CDC:  Should a healthy person age 44 years or older receive zoster vaccine if they are going to be in contact with an unvaccinated infant or an immunocompromised person?    Neither situation is a contraindication to zoster vaccination. A person who receives zoster vaccine who has close household or occupational contact with people who are at risk for developing severe varicella or zoster infection need not take any special precautions after receiving zoster vaccine. The only exception is in the rare instance when a person develops a varicella-like rash after receiving zoster vaccine. A vaccine rash is expected to occur less frequently after zoster vaccine than after varicella vaccine. If a rash develops, the vaccinated person should restrict avoid contact with an immunocompromised person if the immunocompromised person is susceptible to varicella.   ----- Message -----    From: Heath Lark, MD    Sent: 04/21/2015  12:13 PM      To: Randa Ngo Elder, RPH Subject: shingles vaccine                               I have advised the patient against shingles vaccine but her husband is wondering whether it would be a danger to the patient if he were to receive it. The only one vaccine I warn patient is oral polio but I'm not sure about shingles vaccine. Can you check>?

## 2015-04-21 NOTE — Telephone Encounter (Signed)
Pt notified of message below.

## 2015-04-21 NOTE — Assessment & Plan Note (Signed)
I will defer to her surgical oncologist and radiation oncologist for further management. She is doing well with radiation and has completed all adjuvant treatment CT scan is scheduled for next month and I will see her after result is available.

## 2015-04-22 ENCOUNTER — Other Ambulatory Visit: Payer: Self-pay | Admitting: Urology

## 2015-04-23 ENCOUNTER — Ambulatory Visit
Admission: RE | Admit: 2015-04-23 | Discharge: 2015-04-23 | Disposition: A | Discharge status: Home / Self Care | Payer: BLUE CROSS/BLUE SHIELD | Source: Ambulatory Visit | Attending: Radiation Oncology | Admitting: Radiation Oncology

## 2015-04-23 ENCOUNTER — Encounter: Payer: Self-pay | Admitting: Radiation Oncology

## 2015-04-23 ENCOUNTER — Telehealth: Payer: Self-pay | Admitting: *Deleted

## 2015-04-23 VITALS — BP 123/75 | HR 80 | Temp 97.4°F | Resp 20 | Ht 62.0 in | Wt 228.3 lb

## 2015-04-23 DIAGNOSIS — Z8543 Personal history of malignant neoplasm of ovary: Secondary | ICD-10-CM

## 2015-04-23 DIAGNOSIS — R1900 Intra-abdominal and pelvic swelling, mass and lump, unspecified site: Secondary | ICD-10-CM

## 2015-04-23 NOTE — Telephone Encounter (Signed)
Pt reports she is scheduled for Cystoscopy by Dr. Tresa Moore on Friday 9/30.   She has lab/injection on Thurs 9/29 but asks if Dr. Alvy Bimler wants to arrange for her to have more injections since she is going to have this procedure?   Pt says she could come in on Wed 9/28 and could even come Fri 9/30 after the cystoscopy.

## 2015-04-23 NOTE — Progress Notes (Signed)
[name] here for follow up.  She denies pain today.  She reports that after her urinary stent placement on 03/20/15 - her pain has been relieved.  She is having cystoscopy done next Friday.  She does report having a pressure feeling in her rectal area.  She said it is worse with walking and sitting and lasts for a few minutes.  She is a little concerned because she had the same feeling when her tumor recurred before.  She reports having occasional loose stools in her colostomy. She denies having nausea.  She denies having any vaginal/rectal bleeding.  She reports having a small amount of mucous discharge from her rectum.  She denies having any skin irritation in the treatment area.  She does have a red rash on both forearms.  She reports having a good appetite.  Her energy level is improving.  BP 123/75 mmHg  Pulse 80  Temp(Src) 97.4 F (36.3 C) (Oral)  Resp 20  Ht 5\' 2"  (1.575 m)  Wt 228 lb 4.8 oz (103.556 kg)  BMI 41.75 kg/m2  SpO2 100%   Wt Readings from Last 3 Encounters:  04/23/15 228 lb 4.8 oz (103.556 kg)  04/21/15 230 lb (104.327 kg)  03/26/15 226 lb 8 oz (102.74 kg)

## 2015-04-23 NOTE — Progress Notes (Signed)
Patient given size S, S+ and M vaginal dilators.  Advised her to apply water based lubricant to the dilator and to insert for 10 minutes, three times a week (Monday, Wednesday and Friday) and to clean with soap and water after use.  [name] verbalized agreement and understanding.

## 2015-04-23 NOTE — Progress Notes (Signed)
Radiation Oncology         [phone] ________________________________  Name: [name] MRN: [id number]  Date: 04/23/2015  DOB: Jul 21, 1956    Follow-Up Visit Note  CC: MATTHEWS,MICHELLE A., MD  Everitt Amber, MD  No diagnosis found.  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 She denies pain today. She reports that after her urinary stent placement on 03/20/15 - her pain has been relieved. She is having cystoscopy done next Friday. She does report having a pressure feeling in her rectal area. She said it is worse with walking and sitting and lasts for a few minutes. She is a little concerned because she had the same feeling when her tumor recurred before. She reports having occasional loose stools in her colostomy. She denies having nausea. She denies having any vaginal/rectal bleeding. She reports having a small amount of mucous discharge from her rectum. She denies having any skin irritation in the treatment area. She does have a red rash on both forearms. She reports having a good appetite. She reports that her energy levels have improved, noting that she has begun walking more.                               ALLERGIES:  is allergic to penicillins; ultram; adhesive; cefaclor; erythromycin; trimethoprim; pectin; and sulfa antibiotics.  Meds: Current Outpatient Prescriptions  Medication Sig Dispense Refill  . Alum & Mag Hydroxide-Simeth (MAGIC MOUTHWASH) SOLN Take 5 mLs by mouth daily as needed for mouth pain (mouth pain).    . Biotin 5 MG TABS Take by mouth.    . Calcium Carbonate-Vitamin D (CALCIUM + D PO) Take 2 tablets by mouth daily.    . Canagliflozin (INVOKANA) 300 MG TABS Take 1 tablet by mouth every morning.     . Cholecalciferol (VITAMIN D3) 10000 UNITS capsule Take 10,000 Units by mouth once a week. Sundays    . diphenhydrAMINE  (BENADRYL) 25 MG tablet Take 25 mg by mouth every 4 (four) hours as needed.    . diphenoxylate-atropine (LOMOTIL) 2.5-0.025 MG per tablet Take 1 tablet by mouth 4 (four) times daily as needed for diarrhea or loose stools. (Patient not taking: Reported on 03/19/2015) 60 tablet 0  . ferrous sulfate 325 (65 FE) MG tablet Take 1 tablet (325 mg total) by mouth 2 (two) times daily with a meal. (Patient not taking: Reported on 04/21/2015) 60 tablet 3  . filgrastim (NEUPOGEN) 480 MCG/1.6ML injection Inject 1.6 mLs (480 mcg total) into the skin once a week. 1.6 mL 12  . fluticasone (FLOVENT HFA) 110 MCG/ACT inhaler Inhale 1 puff into the lungs 2 (two) times daily.    . hyaluronate sodium (RADIAPLEXRX) GEL Apply 1 application topically 2 (two) times daily.    [name] Kitchen ibuprofen (ADVIL,MOTRIN) 800 MG tablet Take 800 mg by mouth every 8 (eight) hours as needed for moderate pain (pain).    [name] Kitchen levothyroxine (SYNTHROID) 175 MCG tablet Take 175 mcg by mouth daily before breakfast.    . loperamide (IMODIUM) 2 MG capsule Take by mouth as needed for diarrhea or loose stools.    [name] Kitchen loratadine (CLARITIN) 10 MG tablet Take 10 mg by mouth daily.    . magnesium chloride (SLOW-MAG) 64 MG TBEC SR tablet Take 2 tablets (128 mg total) by mouth 2 (two) times daily. (Patient not  taking: Reported on 04/21/2015) 120 tablet 0  . metFORMIN (GLUCOPHAGE) 1000 MG tablet Take 1,000 mg by mouth 2 (two) times daily with a meal.     . Multiple Vitamin (MULTIVITAMIN WITH MINERALS) TABS tablet Take 1 tablet by mouth daily.    . naproxen sodium (ANAPROX) 220 MG tablet Take 220 mg by mouth 2 (two) times daily with a meal.    . omega-3 acid ethyl esters (LOVAZA) 1 G capsule Take 1 g by mouth 2 (two) times daily.    [name] Kitchen omeprazole (PRILOSEC) 20 MG capsule Take 20 mg by mouth at bedtime.     . ondansetron (ZOFRAN) 4 MG tablet Take 1 tablet (4 mg total) by mouth every 6 (six) hours as needed for nausea. (Patient not taking: Reported on 04/21/2015) 20 tablet 0  .  ONETOUCH VERIO test strip     . oxyCODONE (OXY IR/ROXICODONE) 5 MG immediate release tablet Take 1 tablet (5 mg total) by mouth every 4 (four) hours as needed for moderate pain or severe pain. (Patient not taking: Reported on 04/21/2015) 30 tablet 0  . phenazopyridine (PYRIDIUM) 200 MG tablet Take 200 mg by mouth 3 (three) times daily as needed for pain. Called in to [facility] per Dr. Sondra Come.  Disp. 30 tablets. 2 refills.    Vladimir Faster Glycol-Propyl Glycol (SYSTANE OP) Apply 1 drop to eye daily as needed (Dry eyes).     . potassium chloride SA (K-DUR,KLOR-CON) 20 MEQ tablet Take 1 tablet (20 mEq total) by mouth daily. (Patient not taking: Reported on 04/21/2015) 30 tablet 0  . rosuvastatin (CRESTOR) 10 MG tablet Take 10 mg by mouth every evening.     . saccharomyces boulardii (FLORASTOR) 250 MG capsule Take 1 capsule (250 mg total) by mouth 2 (two) times daily. (Patient not taking: Reported on 04/21/2015) 60 capsule 2  . sitaGLIPtin (JANUVIA) 100 MG tablet Take 100 mg by mouth daily.    . Tbo-Filgrastim (GRANIX) 480 MCG/0.8ML SOSY injection Inject 480 mcg into the skin once a week. @[facility]     . valsartan (DIOVAN) 320 MG tablet Take 320 mg by mouth daily.     No current facility-administered medications for this encounter.    Physical Findings: The patient is in no acute distress. Patient is alert and oriented.  vitals were not taken for this visit..  No significant changes. Heart has regular rhythm and rate. Lungs are clear to ausculation. No palpable cervical, supraclavicular, or axillary adenopathy. Recent pelvic exam by Dr. Denman [name] shows no evidence of recurrence.  Lab Findings: Lab Results  Component Value Date   WBC 1.4* 04/21/2015   HGB 11.1* 04/21/2015   HCT 35.5 04/21/2015   MCV 88.8 04/21/2015   PLT 259 04/21/2015    Radiographic Findings: No results found.  Impression:  The patient is recovering from the effects of radiation. She will undergo a CT scan in October and follow up  with Dr. Denman [name] in November.  Plan:  The patient received teaching about the use of a vaginal dilator today.  The patient is scheduled for follow up with Dr. Denman [name] on 06/23/15. She would prefer to follow with gyn onc, in light of her significant surgery(posterior supralevator pelvic exenteration). She will follow up with radiation oncology as needed.  This document serves as a record of services personally performed by Gery Pray, MD. It was created on his behalf by Arlyce Harman, a trained medical scribe. The creation of this record is based on the scribe's personal observations and the Marquies Wanat's  statements to them. This document has been checked and approved by the attending Jesslyn Viglione. -----------------------------------  Blair Promise, PhD, MD

## 2015-04-24 ENCOUNTER — Other Ambulatory Visit: Payer: Self-pay | Admitting: Hematology and Oncology

## 2015-04-24 ENCOUNTER — Ambulatory Visit (HOSPITAL_BASED_OUTPATIENT_CLINIC_OR_DEPARTMENT_OTHER): Payer: BLUE CROSS/BLUE SHIELD

## 2015-04-24 ENCOUNTER — Telehealth: Payer: Self-pay | Admitting: Hematology and Oncology

## 2015-04-24 ENCOUNTER — Telehealth: Payer: Self-pay | Admitting: *Deleted

## 2015-04-24 ENCOUNTER — Other Ambulatory Visit (HOSPITAL_BASED_OUTPATIENT_CLINIC_OR_DEPARTMENT_OTHER): Payer: BLUE CROSS/BLUE SHIELD

## 2015-04-24 VITALS — BP 115/60 | HR 74 | Temp 98.4°F

## 2015-04-24 DIAGNOSIS — D709 Neutropenia, unspecified: Secondary | ICD-10-CM

## 2015-04-24 DIAGNOSIS — C549 Malignant neoplasm of corpus uteri, unspecified: Secondary | ICD-10-CM | POA: Diagnosis not present

## 2015-04-24 LAB — CBC & DIFF AND RETIC
BASO%: 4 % — ABNORMAL HIGH (ref 0.0–2.0)
BASOS ABS: 0.1 10*3/uL (ref 0.0–0.1)
EOS ABS: 0.1 10*3/uL (ref 0.0–0.5)
EOS%: 8.1 % — AB (ref 0.0–7.0)
HEMATOCRIT: 36.8 % (ref 34.8–46.6)
HEMOGLOBIN: 11.7 g/dL (ref 11.6–15.9)
IMMATURE RETIC FRACT: 16.9 % — AB (ref 1.60–10.00)
LYMPH%: 34.1 % (ref 14.0–49.7)
MCH: 28.1 pg (ref 25.1–34.0)
MCHC: 31.8 g/dL (ref 31.5–36.0)
MCV: 88.5 fL (ref 79.5–101.0)
MONO#: 0.5 10*3/uL (ref 0.1–0.9)
MONO%: 31.2 % — AB (ref 0.0–14.0)
NEUT#: 0.4 10*3/uL — CL (ref 1.5–6.5)
NEUT%: 22.6 % — ABNORMAL LOW (ref 38.4–76.8)
PLATELETS: 261 10*3/uL (ref 145–400)
RBC: 4.16 10*6/uL (ref 3.70–5.45)
RDW: 17.2 % — ABNORMAL HIGH (ref 11.2–14.5)
Retic %: 2.28 % — ABNORMAL HIGH (ref 0.70–2.10)
Retic Ct Abs: 94.85 10*3/uL — ABNORMAL HIGH (ref 33.70–90.70)
WBC: 1.7 10*3/uL — ABNORMAL LOW (ref 3.9–10.3)
lymph#: 0.6 10*3/uL — ABNORMAL LOW (ref 0.9–3.3)
nRBC: 0 % (ref 0–0)

## 2015-04-24 MED ORDER — TBO-FILGRASTIM 480 MCG/0.8ML ~~LOC~~ SOSY
480.0000 ug | PREFILLED_SYRINGE | SUBCUTANEOUS | Status: DC
  Administered 2015-04-24: 480 ug via SUBCUTANEOUS
  Filled 2015-04-24: qty 0.8

## 2015-04-24 NOTE — Telephone Encounter (Signed)
Duplicate note

## 2015-04-24 NOTE — Telephone Encounter (Signed)
Pt aware of lab/injection appts added next week.

## 2015-04-24 NOTE — Telephone Encounter (Signed)
s.w. pt and advised on sept appt.....pt ok and aware °

## 2015-04-24 NOTE — Telephone Encounter (Signed)
I will place POF for 9/28, 9/29 and 9/30. We will check labs on 9/28 and 9/30

## 2015-04-25 ENCOUNTER — Encounter (HOSPITAL_BASED_OUTPATIENT_CLINIC_OR_DEPARTMENT_OTHER): Payer: Self-pay | Admitting: *Deleted

## 2015-04-25 NOTE — Progress Notes (Addendum)
NPO AFTER MN.  ARRIVE AT 0600.  NEEDS ISTAT 8.  CURRENT EKG IN CHART AND EPIC.  WILL TAKE DIOVAN AND SYNTHROID AM DOS W/ SIPS OF WATER.  PT STATES LAST VISIT ON 03-20-2015 DIFFICULT IV STICK, SMALL VEINS. STATES THEY USED SMALLER CATH WITH SUCCESS

## 2015-04-30 ENCOUNTER — Ambulatory Visit: Payer: BLUE CROSS/BLUE SHIELD

## 2015-04-30 ENCOUNTER — Other Ambulatory Visit (HOSPITAL_BASED_OUTPATIENT_CLINIC_OR_DEPARTMENT_OTHER): Payer: BLUE CROSS/BLUE SHIELD

## 2015-04-30 DIAGNOSIS — D709 Neutropenia, unspecified: Secondary | ICD-10-CM | POA: Diagnosis not present

## 2015-04-30 LAB — CBC & DIFF AND RETIC
BASO%: 0.7 % (ref 0.0–2.0)
Basophils Absolute: 0 10*3/uL (ref 0.0–0.1)
EOS%: 5.6 % (ref 0.0–7.0)
Eosinophils Absolute: 0.2 10*3/uL (ref 0.0–0.5)
HCT: 37.7 % (ref 34.8–46.6)
HEMOGLOBIN: 12 g/dL (ref 11.6–15.9)
Immature Retic Fract: 7.5 % (ref 1.60–10.00)
LYMPH%: 22.6 % (ref 14.0–49.7)
MCH: 27.9 pg (ref 25.1–34.0)
MCHC: 31.8 g/dL (ref 31.5–36.0)
MCV: 87.7 fL (ref 79.5–101.0)
MONO#: 0.3 10*3/uL (ref 0.1–0.9)
MONO%: 11.1 % (ref 0.0–14.0)
NEUT%: 60 % (ref 38.4–76.8)
NEUTROS ABS: 1.8 10*3/uL (ref 1.5–6.5)
Platelets: 227 10*3/uL (ref 145–400)
RBC: 4.3 10*6/uL (ref 3.70–5.45)
RDW: 16.9 % — ABNORMAL HIGH (ref 11.2–14.5)
Retic %: 1.35 % (ref 0.70–2.10)
Retic Ct Abs: 58.05 10*3/uL (ref 33.70–90.70)
WBC: 3.1 10*3/uL — AB (ref 3.9–10.3)
lymph#: 0.7 10*3/uL — ABNORMAL LOW (ref 0.9–3.3)

## 2015-04-30 NOTE — Progress Notes (Signed)
ANC 1.8 today.  She doesn't get Granix if ANC >1.5.  Will not get it today.  Will recheck on Friday per Dr Alvy Bimler.

## 2015-05-01 ENCOUNTER — Other Ambulatory Visit: Payer: BLUE CROSS/BLUE SHIELD

## 2015-05-01 ENCOUNTER — Ambulatory Visit: Payer: BLUE CROSS/BLUE SHIELD

## 2015-05-02 ENCOUNTER — Ambulatory Visit (HOSPITAL_BASED_OUTPATIENT_CLINIC_OR_DEPARTMENT_OTHER)
Admission: RE | Admit: 2015-05-02 | Discharge: 2015-05-02 | Disposition: A | Discharge status: Home / Self Care | Payer: BLUE CROSS/BLUE SHIELD | Source: Ambulatory Visit | Attending: Urology | Admitting: Urology

## 2015-05-02 ENCOUNTER — Ambulatory Visit: Payer: BLUE CROSS/BLUE SHIELD

## 2015-05-02 ENCOUNTER — Ambulatory Visit (HOSPITAL_BASED_OUTPATIENT_CLINIC_OR_DEPARTMENT_OTHER): Payer: BLUE CROSS/BLUE SHIELD | Admitting: Anesthesiology

## 2015-05-02 ENCOUNTER — Encounter: Payer: Self-pay | Admitting: Hematology and Oncology

## 2015-05-02 ENCOUNTER — Encounter (HOSPITAL_BASED_OUTPATIENT_CLINIC_OR_DEPARTMENT_OTHER)
Admission: RE | Disposition: A | Discharge status: Home / Self Care | Payer: Self-pay | Source: Ambulatory Visit | Attending: Urology

## 2015-05-02 ENCOUNTER — Encounter (HOSPITAL_BASED_OUTPATIENT_CLINIC_OR_DEPARTMENT_OTHER): Payer: Self-pay | Admitting: *Deleted

## 2015-05-02 ENCOUNTER — Other Ambulatory Visit (HOSPITAL_BASED_OUTPATIENT_CLINIC_OR_DEPARTMENT_OTHER): Payer: BLUE CROSS/BLUE SHIELD

## 2015-05-02 DIAGNOSIS — Z791 Long term (current) use of non-steroidal anti-inflammatories (NSAID): Secondary | ICD-10-CM | POA: Diagnosis not present

## 2015-05-02 DIAGNOSIS — N135 Crossing vessel and stricture of ureter without hydronephrosis: Secondary | ICD-10-CM | POA: Diagnosis present

## 2015-05-02 DIAGNOSIS — D649 Anemia, unspecified: Secondary | ICD-10-CM | POA: Diagnosis not present

## 2015-05-02 DIAGNOSIS — K219 Gastro-esophageal reflux disease without esophagitis: Secondary | ICD-10-CM | POA: Diagnosis not present

## 2015-05-02 DIAGNOSIS — Z79899 Other long term (current) drug therapy: Secondary | ICD-10-CM | POA: Insufficient documentation

## 2015-05-02 DIAGNOSIS — F329 Major depressive disorder, single episode, unspecified: Secondary | ICD-10-CM | POA: Diagnosis not present

## 2015-05-02 DIAGNOSIS — G709 Myoneural disorder, unspecified: Secondary | ICD-10-CM | POA: Diagnosis not present

## 2015-05-02 DIAGNOSIS — Z7951 Long term (current) use of inhaled steroids: Secondary | ICD-10-CM | POA: Insufficient documentation

## 2015-05-02 DIAGNOSIS — Z79891 Long term (current) use of opiate analgesic: Secondary | ICD-10-CM | POA: Diagnosis not present

## 2015-05-02 DIAGNOSIS — Z9221 Personal history of antineoplastic chemotherapy: Secondary | ICD-10-CM | POA: Diagnosis not present

## 2015-05-02 DIAGNOSIS — D709 Neutropenia, unspecified: Secondary | ICD-10-CM | POA: Diagnosis not present

## 2015-05-02 DIAGNOSIS — E039 Hypothyroidism, unspecified: Secondary | ICD-10-CM | POA: Insufficient documentation

## 2015-05-02 DIAGNOSIS — E119 Type 2 diabetes mellitus without complications: Secondary | ICD-10-CM | POA: Diagnosis not present

## 2015-05-02 DIAGNOSIS — Z923 Personal history of irradiation: Secondary | ICD-10-CM | POA: Diagnosis not present

## 2015-05-02 DIAGNOSIS — K449 Diaphragmatic hernia without obstruction or gangrene: Secondary | ICD-10-CM | POA: Insufficient documentation

## 2015-05-02 DIAGNOSIS — Z8542 Personal history of malignant neoplasm of other parts of uterus: Secondary | ICD-10-CM | POA: Diagnosis not present

## 2015-05-02 DIAGNOSIS — I10 Essential (primary) hypertension: Secondary | ICD-10-CM | POA: Diagnosis not present

## 2015-05-02 DIAGNOSIS — Z8543 Personal history of malignant neoplasm of ovary: Secondary | ICD-10-CM | POA: Diagnosis not present

## 2015-05-02 HISTORY — DX: Neutropenia, unspecified: D70.9

## 2015-05-02 HISTORY — DX: Personal history of irradiation: Z92.3

## 2015-05-02 HISTORY — DX: Malignant neoplasm of corpus uteri, unspecified: C54.9

## 2015-05-02 HISTORY — PX: CYSTOSCOPY WITH RETROGRADE PYELOGRAM, URETEROSCOPY AND STENT PLACEMENT: SHX5789

## 2015-05-02 HISTORY — DX: Personal history of other diseases of the digestive system: Z87.19

## 2015-05-02 HISTORY — DX: Crossing vessel and stricture of ureter without hydronephrosis: N13.5

## 2015-05-02 LAB — GLUCOSE, CAPILLARY: Glucose-Capillary: 118 mg/dL — ABNORMAL HIGH (ref 65–99)

## 2015-05-02 LAB — POCT I-STAT, CHEM 8
BUN: 16 mg/dL (ref 6–20)
CALCIUM ION: 1.28 mmol/L (ref 1.13–1.30)
CHLORIDE: 101 mmol/L (ref 101–111)
CREATININE: 0.8 mg/dL (ref 0.44–1.00)
GLUCOSE: 115 mg/dL — AB (ref 65–99)
HCT: 35 % — ABNORMAL LOW (ref 36.0–46.0)
Hemoglobin: 11.9 g/dL — ABNORMAL LOW (ref 12.0–15.0)
POTASSIUM: 3.5 mmol/L (ref 3.5–5.1)
Sodium: 140 mmol/L (ref 135–145)
TCO2: 26 mmol/L (ref 0–100)

## 2015-05-02 LAB — CBC & DIFF AND RETIC
BASO%: 0.7 % (ref 0.0–2.0)
Basophils Absolute: 0 10*3/uL (ref 0.0–0.1)
EOS ABS: 0.1 10*3/uL (ref 0.0–0.5)
EOS%: 2.7 % (ref 0.0–7.0)
HCT: 36 % (ref 34.8–46.6)
HEMOGLOBIN: 11.3 g/dL — AB (ref 11.6–15.9)
IMMATURE RETIC FRACT: 11.6 % — AB (ref 1.60–10.00)
LYMPH#: 0.4 10*3/uL — AB (ref 0.9–3.3)
LYMPH%: 11.7 % — ABNORMAL LOW (ref 14.0–49.7)
MCH: 27.8 pg (ref 25.1–34.0)
MCHC: 31.4 g/dL — ABNORMAL LOW (ref 31.5–36.0)
MCV: 88.7 fL (ref 79.5–101.0)
MONO#: 0.2 10*3/uL (ref 0.1–0.9)
MONO%: 5.3 % (ref 0.0–14.0)
NEUT%: 79.6 % — AB (ref 38.4–76.8)
NEUTROS ABS: 2.4 10*3/uL (ref 1.5–6.5)
Platelets: 195 10*3/uL (ref 145–400)
RBC: 4.06 10*6/uL (ref 3.70–5.45)
RDW: 16.8 % — ABNORMAL HIGH (ref 11.2–14.5)
RETIC CT ABS: 46.69 10*3/uL (ref 33.70–90.70)
Retic %: 1.15 % (ref 0.70–2.10)
WBC: 3 10*3/uL — AB (ref 3.9–10.3)

## 2015-05-02 SURGERY — CYSTOURETEROSCOPY, WITH RETROGRADE PYELOGRAM AND STENT INSERTION
Anesthesia: General | Site: Renal | Laterality: Right

## 2015-05-02 MED ORDER — PROMETHAZINE HCL 25 MG/ML IJ SOLN
6.2500 mg | INTRAMUSCULAR | Status: DC | PRN
  Filled 2015-05-02: qty 1

## 2015-05-02 MED ORDER — LACTATED RINGERS IV SOLN
INTRAVENOUS | Status: DC
  Administered 2015-05-02: 07:00:00 via INTRAVENOUS
  Filled 2015-05-02: qty 1000

## 2015-05-02 MED ORDER — MEPERIDINE HCL 25 MG/ML IJ SOLN
6.2500 mg | INTRAMUSCULAR | Status: DC | PRN
  Filled 2015-05-02: qty 1

## 2015-05-02 MED ORDER — LACTATED RINGERS IV SOLN
INTRAVENOUS | Status: DC
  Filled 2015-05-02: qty 1000

## 2015-05-02 MED ORDER — LIDOCAINE HCL (CARDIAC) 20 MG/ML IV SOLN
INTRAVENOUS | Status: DC | PRN
  Administered 2015-05-02: 80 mg via INTRAVENOUS

## 2015-05-02 MED ORDER — ONDANSETRON HCL 4 MG/2ML IJ SOLN
INTRAMUSCULAR | Status: DC | PRN
  Administered 2015-05-02: 4 mg via INTRAVENOUS

## 2015-05-02 MED ORDER — SODIUM CHLORIDE 0.9 % IR SOLN
Status: DC | PRN
  Administered 2015-05-02: 4000 mL

## 2015-05-02 MED ORDER — ACETAMINOPHEN 10 MG/ML IV SOLN
INTRAVENOUS | Status: DC | PRN
  Administered 2015-05-02: 1000 mg via INTRAVENOUS

## 2015-05-02 MED ORDER — GENTAMICIN SULFATE 40 MG/ML IJ SOLN
360.0000 mg | INTRAVENOUS | Status: AC
  Administered 2015-05-02: 360 mg via INTRAVENOUS
  Filled 2015-05-02: qty 9

## 2015-05-02 MED ORDER — FENTANYL CITRATE (PF) 100 MCG/2ML IJ SOLN
25.0000 ug | INTRAMUSCULAR | Status: DC | PRN
  Filled 2015-05-02: qty 1

## 2015-05-02 MED ORDER — IOHEXOL 350 MG/ML SOLN
INTRAVENOUS | Status: DC | PRN
  Administered 2015-05-02: 18 mL

## 2015-05-02 MED ORDER — KETOROLAC TROMETHAMINE 30 MG/ML IJ SOLN
INTRAMUSCULAR | Status: DC | PRN
  Administered 2015-05-02: 30 mg via INTRAVENOUS

## 2015-05-02 MED ORDER — OXYCODONE HCL 5 MG PO TABS: 5.0000 mg | ORAL_TABLET | ORAL | Status: DC | PRN

## 2015-05-02 MED ORDER — MIDAZOLAM HCL 2 MG/2ML IJ SOLN
INTRAMUSCULAR | Status: AC
  Filled 2015-05-02: qty 2

## 2015-05-02 MED ORDER — DEXAMETHASONE SODIUM PHOSPHATE 4 MG/ML IJ SOLN
INTRAMUSCULAR | Status: DC | PRN
Start: 2015-05-02 — End: 2015-05-02
  Administered 2015-05-02: 5 mg via INTRAVENOUS

## 2015-05-02 MED ORDER — FENTANYL CITRATE (PF) 100 MCG/2ML IJ SOLN
INTRAMUSCULAR | Status: DC | PRN
  Administered 2015-05-02: 50 ug via INTRAVENOUS

## 2015-05-02 MED ORDER — FENTANYL CITRATE (PF) 100 MCG/2ML IJ SOLN
INTRAMUSCULAR | Status: AC
  Filled 2015-05-02: qty 4

## 2015-05-02 MED ORDER — GENTAMICIN IN SALINE 1.6-0.9 MG/ML-% IV SOLN
80.0000 mg | INTRAVENOUS | Status: DC
  Filled 2015-05-02: qty 50

## 2015-05-02 MED ORDER — PROPOFOL 10 MG/ML IV BOLUS
INTRAVENOUS | Status: DC | PRN
  Administered 2015-05-02: 250 mg via INTRAVENOUS

## 2015-05-02 MED ORDER — MIDAZOLAM HCL 5 MG/5ML IJ SOLN
INTRAMUSCULAR | Status: DC | PRN
  Administered 2015-05-02: 2 mg via INTRAVENOUS

## 2015-05-02 SURGICAL SUPPLY — 24 items
BAG DRAIN URO-CYSTO SKYTR STRL (DRAIN) ×2 IMPLANT
BASKET LASER NITINOL 1.9FR (BASKET) ×2 IMPLANT
BASKET STONE 1.7 NGAGE (UROLOGICAL SUPPLIES) IMPLANT
BASKET ZERO TIP NITINOL 2.4FR (BASKET) IMPLANT
CANISTER SUCT LVC 12 LTR MEDI- (MISCELLANEOUS) IMPLANT
CATH INTERMIT  6FR 70CM (CATHETERS) ×2 IMPLANT
CLOTH BEACON ORANGE TIMEOUT ST (SAFETY) ×2 IMPLANT
FIBER LASER FLEXIVA 365 (UROLOGICAL SUPPLIES) IMPLANT
FIBER LASER TRAC TIP (UROLOGICAL SUPPLIES) IMPLANT
GLOVE BIO SURGEON STRL SZ7.5 (GLOVE) ×2 IMPLANT
GLOVE BIOGEL M 6.5 STRL (GLOVE) ×2 IMPLANT
GLOVE BIOGEL PI IND STRL 6.5 (GLOVE) ×2 IMPLANT
GLOVE BIOGEL PI INDICATOR 6.5 (GLOVE) ×2
GOWN STRL REUS W/ TWL LRG LVL3 (GOWN DISPOSABLE) ×2 IMPLANT
GOWN STRL REUS W/ TWL XL LVL3 (GOWN DISPOSABLE) ×1 IMPLANT
GOWN STRL REUS W/TWL LRG LVL3 (GOWN DISPOSABLE) ×4 IMPLANT
GOWN STRL REUS W/TWL XL LVL3 (GOWN DISPOSABLE) ×1
GUIDEWIRE ANG ZIPWIRE 038X150 (WIRE) ×2 IMPLANT
GUIDEWIRE STR DUAL SENSOR (WIRE) ×2 IMPLANT
IV NS IRRIG 3000ML ARTHROMATIC (IV SOLUTION) ×2 IMPLANT
MANIFOLD NEPTUNE II (INSTRUMENTS) ×2 IMPLANT
PACK CYSTO (CUSTOM PROCEDURE TRAY) ×2 IMPLANT
SYRINGE 10CC LL (SYRINGE) ×2 IMPLANT
TUBE FEEDING 8FR 16IN STR KANG (MISCELLANEOUS) IMPLANT

## 2015-05-02 NOTE — Discharge Instructions (Signed)
1 - You may have urinary urgency (bladder spasms) and bloody urine on / off  X few days. This is normal.  2 - Call MD or go to ER for fever >102, severe pain / nausea / vomiting not relieved by medications, or acute change in medical status    Post Anesthesia Home Care Instructions  Activity: Get plenty of rest for the remainder of the day. A responsible adult should stay with you for 24 hours following the procedure.  For the next 24 hours, DO NOT: -Drive a car -Paediatric nurse -Drink alcoholic beverages -Take any medication unless instructed by your physician -Make any legal decisions or sign important papers.  Meals: Start with liquid foods such as gelatin or soup. Progress to regular foods as tolerated. Avoid greasy, spicy, heavy foods. If nausea and/or vomiting occur, drink only clear liquids until the nausea and/or vomiting subsides. Call your physician if vomiting continues.  Special Instructions/Symptoms: Your throat may feel dry or sore from the anesthesia or the breathing tube placed in your throat during surgery. If this causes discomfort, gargle with warm salt water. The discomfort should disappear within 24 hours.  If you had a scopolamine patch placed behind your ear for the management of post- operative nausea and/or vomiting:  1. The medication in the patch is effective for 72 hours, after which it should be removed.  Wrap patch in a tissue and discard in the trash. Wash hands thoroughly with soap and water. 2. You may remove the patch earlier than 72 hours if you experience unpleasant side effects which may include dry mouth, dizziness or visual disturbances. 3. Avoid touching the patch. Wash your hands with soap and water after contact with the patch.

## 2015-05-02 NOTE — Anesthesia Procedure Notes (Signed)
Procedure Name: LMA Insertion Date/Time: 05/02/2015 7:34 AM Performed by: Bethena Roys T Pre-anesthesia Checklist: Patient identified, Emergency Drugs available, Suction available and Patient being monitored Patient Re-evaluated:Patient Re-evaluated prior to inductionOxygen Delivery Method: Circle System Utilized Preoxygenation: Pre-oxygenation with 100% oxygen Intubation Type: IV induction Ventilation: Mask ventilation without difficulty LMA: LMA with gastric port inserted LMA Size: 4.0 Number of attempts: 1 Placement Confirmation: positive ETCO2 Tube secured with: Tape Dental Injury: Teeth and Oropharynx as per pre-operative assessment

## 2015-05-02 NOTE — Transfer of Care (Signed)
Immediate Anesthesia Transfer of Care Note  Patient: [name]  Procedure(s) Performed: Procedure(s): CYSTOSCOPY WITH RIGHT RETROGRADE PYELOGRAM,  DIAGNOSTIC URETEROSCOPY AND STENT PULL  (Right)  Patient Location: PACU  Anesthesia Type:General  Level of Consciousness: awake, alert  and oriented  Airway & Oxygen Therapy: Patient Spontanous Breathing and Patient connected to nasal cannula oxygen  Post-op Assessment: Report given to RN  Post vital signs: Reviewed and stable  Last Vitals:  Filed Vitals:   05/02/15 0616  BP: 119/64  Pulse: 73  Temp: 36.7 C  Resp: 18    Complications: No apparent anesthesia complications

## 2015-05-02 NOTE — Progress Notes (Signed)
[id number] Name:[employer] PA - [employer];Status:Approved;Coverage Start Date:04/02/2015;Coverage End Date:10/29/2015;

## 2015-05-02 NOTE — Anesthesia Postprocedure Evaluation (Signed)
  Anesthesia Post-op Note  Patient: Financial planner  Procedure(s) Performed: Procedure(s) (LRB): CYSTOSCOPY WITH RIGHT RETROGRADE PYELOGRAM,  DIAGNOSTIC URETEROSCOPY AND STENT PULL  (Right)  Patient Location: PACU  Anesthesia Type: General  Level of Consciousness: awake and alert   Airway and Oxygen Therapy: Patient Spontanous Breathing  Post-op Pain: mild  Post-op Assessment: Post-op Vital signs reviewed, Patient's Cardiovascular Status Stable, Respiratory Function Stable, Patent Airway and No signs of Nausea or vomiting  Last Vitals:  Filed Vitals:   05/02/15 0924  BP: 104/59  Pulse: 59  Temp: 36.4 C  Resp: 12    Post-op Vital Signs: stable   Complications: No apparent anesthesia complications

## 2015-05-02 NOTE — Anesthesia Preprocedure Evaluation (Addendum)
Anesthesia Evaluation  Patient identified by MRN, date of birth, ID band Patient awake  General Assessment Comment:   Reviewed: Allergy & Precautions, NPO status , Patient's Chart, lab work & pertinent test results  Airway Mallampati: II  TM Distance: >3 FB Neck ROM: Full    Dental no notable dental hx.    Pulmonary neg pulmonary ROS,    Pulmonary exam normal breath sounds clear to auscultation       Cardiovascular hypertension, Pt. on medications Normal cardiovascular exam+ dysrhythmias  Rhythm:Regular Rate:Normal     Neuro/Psych PSYCHIATRIC DISORDERS Depression  Neuromuscular disease    GI/Hepatic Neg liver ROS, hiatal hernia, GERD  Medicated,  Endo/Other  diabetes, Type 2, Oral Hypoglycemic AgentsHypothyroidism Morbid obesity  Renal/GU Renal diseaseHydronephrosis. Most recent Cr and K normal.  negative genitourinary   Musculoskeletal negative musculoskeletal ROS (+)   Abdominal (+) + obese,   Peds negative pediatric ROS (+)  Hematology  (+) Blood dyscrasia, anemia , Chronic neutropenia   Anesthesia Other Findings Her eyelids differ. The left is more open and doesn't close as well. She has had eyelid surgery and she uses Systane before bed.  Reproductive/Obstetrics negative OB ROS                            Anesthesia Physical  Anesthesia Plan  ASA: III  Anesthesia Plan: General   Post-op Pain Management:    Induction: Intravenous  Airway Management Planned: LMA  Additional Equipment:   Intra-op Plan:   Post-operative Plan: Extubation in OR  Informed Consent: I have reviewed the patients History and Physical, chart, labs and discussed the procedure including the risks, benefits and alternatives for the proposed anesthesia with the patient or authorized representative who has indicated his/her understanding and acceptance.   Dental advisory given  Plan Discussed with:  CRNA  Anesthesia Plan Comments:         Anesthesia Quick Evaluation

## 2015-05-02 NOTE — Progress Notes (Signed)
[name] here after cystoscopy procedure.  She didn't require a stint today.  Her blood drawn today showed ANC 2.4.  Does not need Granix injection today   Will return as scheduled and knows to call if she has any problems before then.

## 2015-05-02 NOTE — Brief Op Note (Signed)
05/02/2015  7:54 AM  PATIENT:  [name]  59 y.o. female  PRE-OPERATIVE DIAGNOSIS:  RIGHT URETERAL STRICTURE  POST-OPERATIVE DIAGNOSIS:  RIGHT URETERAL STRICTURE  PROCEDURE:  Procedure(s) with comments: CYSTOSCOPY WITH RIGHT RETROGRADE PYELOGRAM,  DIAGNOSTIC URETEROSCOPY AND STENT PULL  (Right) - 1 HR  NEEDS DIGITAL URETEROSCOPE [phone] [id number]  SURGEON:  Surgeon(s) and Role:    * Alexis Frock, MD - Primary  PHYSICIAN ASSISTANT:   ASSISTANTS: none   ANESTHESIA:   general  EBL:  Total I/O In: 200 [I.V.:200] Out: -   BLOOD ADMINISTERED:none  DRAINS: none   LOCAL MEDICATIONS USED:  NONE  SPECIMEN:  No Specimen  DISPOSITION OF SPECIMEN:  N/A  COUNTS:  YES  TOURNIQUET:  * No tourniquets in log *  DICTATION: .Other Dictation: Dictation Number [id number]  PLAN OF CARE: Discharge to home after PACU  PATIENT DISPOSITION:  PACU - hemodynamically stable.   Delay start of Pharmacological VTE agent (>24hrs) due to surgical blood loss or risk of bleeding: yes

## 2015-05-02 NOTE — H&P (Signed)
[name] is an 59 y.o. female.    Chief Complaint: Pre-OP Right Ureteroscopy and stent exchange v. removal  HPI:   1 - Bladder Injury - s/p cystotomy repair and bilateral JJ stent placement by GYN oncology team 11/2014 at time of pelvic exenteration / end colostomy for metastatic GYN cancer. F/u imaging with CT 12/30/14 w/o bladder leak and stents in good position. Most recent Cr normal.  2 - Metastatic Endometrial Cancer - s/p repeat resection [phone] with colon and vaginal involvment but negative nodes / margins.   3 - Right Ureteral Stricture - New Rt hydro by CT 03/2015 to distal ureter on eval flank pain.   Recent Course: 03/2015 - Operative cysto / ureteroscopy c/w likley 2-3cm stricture (distal end about 3cm proximal to UO, balloon to 92F, 8x24 JJ stent placed)   PMH sig for DM2, morbid obesity. Her PCP is [name].  Today "[name]" is seen to proceed with diagnostic right ureteroscopy with stent exchange v. Removal for further carecterization and management of right ureteral stricture. Most recent UCX with scant  Non-clonal growth.   Past Medical History  Diagnosis Date  . Benign essential HTN 12/14/2011  . Multiple thyroid nodules     Managed by Dr. Harlow Asa  . GERD (gastroesophageal reflux disease)   . Depression   . Dysuria   . Radiation-induced dermatitis     contact dermatitis , radiation completed, rash only on ankles now.  . History of gastric polyp     2014  duodenum  . History of radiation therapy     01-29-2015 to 03-10-2015  pelvis 50.4Gy  . Hypothyroidism   . DM type 2 (diabetes mellitus, type 2)   . Hiatal hernia   . Anemia in neoplastic disease   . Chronic idiopathic neutropenia     presumed related to chemotherapy March 2006--- followed by dr Alvy Bimler   . Cancer of corpus uteri, except isthmus oncologist-- dr Denman [name] and dr Alvy Bimler     Mar 2006 dx endometrial and ovarian cancer s/p  chemotheapy and surgery  . Ureteral stricture, right   . S/P colostomy      Past Surgical History  Procedure Laterality Date  . Appendectomy    . Tonsillectomy    . Eye surgery      pytosis of eyelids-child  . Colonoscopy with propofol N/A 08/21/2013    Procedure: COLONOSCOPY WITH PROPOFOL;  Surgeon: Cleotis Nipper, MD;  Location: [facility];  Service: Endoscopy;  Laterality: N/A;  . Eus N/A 10/02/2014    Procedure: LOWER ENDOSCOPIC ULTRASOUND (EUS);  Surgeon: Arta Silence, MD;  Location: [facility];  Service: Endoscopy;  Laterality: N/A;  . Robotic assisted lap vaginal hysterectomy N/A 11/19/2014    Procedure: ROBOTIC LYSIS OF ADHESIONS, CONVERTED TO LAPAROTOMY RADICAL UPPER VAGINECTOMY,LOW ANTERIOR BOWEL RESECTION, COLOSTOMY, BILATERAL URETERAL STENT PLACEMENT AND CYSTONOMY CLOSURE;  Surgeon: Everitt Amber, MD;  Location: [facility];  Service: Gynecology;  Laterality: N/A;  . Ostomy N/A 11/19/2014    Procedure: OSTOMY;  Surgeon: Michael [name], MD;  Location: [facility];  Service: General;  Laterality: N/A;  . Colostomy takedown N/A 12/04/2014    Procedure: LAPROSCOPIC LYSIS OF ADHESIONS, SPLENIC MOBILIZATION, RELOCATION OF COLOSTOMY, DEBRIDEMENT INITIAL COLOSTOMY SITE;  Surgeon: Michael [name], MD;  Location: [facility];  Service: General;  Laterality: N/A;  . Cystoscopy with retrograde pyelogram, ureteroscopy and stent placement Right 03/20/2015    Procedure: CYSTOSCOPY WITH RETROGRADE PYELOGRAM, URETEROSCOPY WITH BALLOON DILATION AND STENT PLACEMENT ON RIGHT;  Surgeon: Alexis Frock, MD;  Location: [place];  Service: Urology;  Laterality: Right;  . Excision soft tissue mass right foreman  12-08-2006  . Abdominal hysterectomy  2006    staging for Ovarian cancer  . Laparoscopic cholecystectomy      Family History  Problem Relation Age of Onset  . Cancer Mother     stomach ca  . Hypertension Mother   . Cancer Father     prostate ca  . Diabetes Father   . Diabetes Sister   . Hypertension Brother y-10   Social History:  reports that she has never  smoked. She has never used smokeless tobacco. She reports that she drinks alcohol. She reports that she does not use illicit drugs.  Allergies:  Allergies  Allergen Reactions  . Penicillins Swelling    Facial swelling  . Ultram [Tramadol] Hives  . Adhesive [Tape]     blisters  . Cefaclor Rash    Ceclor  . Erythromycin     Gastritis, abd cramps  . Trimethoprim Rash  . Pectin Rash    Pectin ring for stoma  . Sulfa Antibiotics Rash    Medications Prior to Admission  Medication Sig Dispense Refill  . Alum & Mag Hydroxide-Simeth (MAGIC MOUTHWASH) SOLN Take 5 mLs by mouth daily as needed for mouth pain (mouth pain).    . Biotin 5 MG TABS Take 1 tablet by mouth daily.     . Calcium Carbonate-Vitamin D (CALCIUM + D PO) Take 2 tablets by mouth daily.    . Canagliflozin (INVOKANA) 300 MG TABS Take 1 tablet by mouth every morning.     . Cholecalciferol (VITAMIN D3) 10000 UNITS capsule Take 10,000 Units by mouth once a week. Sundays    . diphenhydrAMINE (BENADRYL) 25 MG tablet Take 25 mg by mouth every 4 (four) hours as needed.    . diphenoxylate-atropine (LOMOTIL) 2.5-0.025 MG per tablet Take 1 tablet by mouth 4 (four) times daily as needed for diarrhea or loose stools. 60 tablet 0  . filgrastim (NEUPOGEN) 480 MCG/1.6ML injection Inject 1.6 mLs (480 mcg total) into the skin once a week. 1.6 mL 12  . fluticasone (FLOVENT HFA) 110 MCG/ACT inhaler Inhale 1 puff into the lungs as needed.     . hyaluronate sodium (RADIAPLEXRX) GEL Apply 1 application topically 2 (two) times daily as needed.     [name] Kitchen ibuprofen (ADVIL,MOTRIN) 800 MG tablet Take 800 mg by mouth every 8 (eight) hours as needed for moderate pain (pain).    [name] Kitchen levothyroxine (SYNTHROID) 175 MCG tablet Take 175 mcg by mouth daily before breakfast.    . loperamide (IMODIUM) 2 MG capsule Take by mouth as needed for diarrhea or loose stools.    [name] Kitchen loratadine (CLARITIN) 10 MG tablet Take 10 mg by mouth daily.    . metFORMIN (GLUCOPHAGE) 1000 MG  tablet Take 1,000 mg by mouth 2 (two) times daily with a meal.     . Multiple Vitamin (MULTIVITAMIN WITH MINERALS) TABS tablet Take 1 tablet by mouth daily.    . naproxen sodium (ANAPROX) 220 MG tablet Take 220 mg by mouth 2 (two) times daily with a meal.    . omega-3 acid ethyl esters (LOVAZA) 1 G capsule Take 1 g by mouth 2 (two) times daily.    [name] Kitchen omeprazole (PRILOSEC) 20 MG capsule Take 20 mg by mouth at bedtime.     . ondansetron (ZOFRAN) 4 MG tablet Take 1 tablet (4 mg total) by mouth every 6 (six) hours as needed for  nausea. 20 tablet 0  . oxyCODONE (OXY IR/ROXICODONE) 5 MG immediate release tablet Take 1 tablet (5 mg total) by mouth every 4 (four) hours as needed for moderate pain or severe pain. 30 tablet 0  . phenazopyridine (PYRIDIUM) 200 MG tablet Take 200 mg by mouth 3 (three) times daily as needed for pain. Called in to [facility] per Dr. Sondra Come.  Disp. 30 tablets. 2 refills.    Vladimir Faster Glycol-Propyl Glycol (SYSTANE OP) Apply 1 drop to eye daily as needed (Dry eyes).     . rosuvastatin (CRESTOR) 10 MG tablet Take 10 mg by mouth every evening.     . sitaGLIPtin (JANUVIA) 100 MG tablet Take 100 mg by mouth daily.    . Tbo-Filgrastim (GRANIX) 480 MCG/0.8ML SOSY injection Inject 480 mcg into the skin once a week. @[facility]     . valsartan (DIOVAN) 320 MG tablet Take 320 mg by mouth every morning.       Results for orders placed or performed in visit on 04/30/15 (from the past 48 hour(s))  CBC & Diff and Retic     Status: Abnormal   Collection Time: 04/30/15  1:52 PM  Result Value Ref Range   WBC 3.1 (L) 3.9 - 10.3 10e3/uL   NEUT# 1.8 1.5 - 6.5 10e3/uL   HGB 12.0 11.6 - 15.9 g/dL   HCT 37.7 34.8 - 46.6 %   Platelets 227 145 - 400 10e3/uL   MCV 87.7 79.5 - 101.0 fL   MCH 27.9 25.1 - 34.0 pg   MCHC 31.8 31.5 - 36.0 g/dL   RBC 4.30 3.70 - 5.45 10e6/uL   RDW 16.9 (H) 11.2 - 14.5 %   lymph# 0.7 (L) 0.9 - 3.3 10e3/uL   MONO# 0.3 0.1 - 0.9 10e3/uL   Eosinophils Absolute 0.2 0.0 - 0.5  10e3/uL   Basophils Absolute 0.0 0.0 - 0.1 10e3/uL   NEUT% 60.0 38.4 - 76.8 %   LYMPH% 22.6 14.0 - 49.7 %   MONO% 11.1 0.0 - 14.0 %   EOS% 5.6 0.0 - 7.0 %   BASO% 0.7 0.0 - 2.0 %   Retic % 1.35 0.70 - 2.10 %   Retic Ct Abs 58.05 33.70 - 90.70 10e3/uL   Immature Retic Fract 7.50 1.60 - 10.00 %   No results found.  Review of Systems  Constitutional: Negative.  Negative for fever and chills.  HENT: Negative.   Eyes: Negative.   Respiratory: Negative.   Cardiovascular: Negative.   Gastrointestinal: Negative.   Genitourinary: Negative.   Musculoskeletal: Negative.   Skin: Negative.   Neurological: Negative.   Endo/Heme/Allergies: Negative.   Psychiatric/Behavioral: Negative.     There were no vitals taken for this visit. Physical Exam  Constitutional: She appears well-developed.  HENT:  Head: Normocephalic.  Eyes: Pupils are equal, round, and reactive to light.  Neck: Normal range of motion.  Cardiovascular: Normal rate.   Respiratory: Effort normal.  GI: Soft.  Significant truncal obesity, many abd scars  Genitourinary:  No CVAT  Musculoskeletal: Normal range of motion.  Neurological: She is alert.  Skin: Skin is warm.  Psychiatric: She has a normal mood and affect. Her behavior is normal. Judgment and thought content normal.     Assessment/Plan  1 - Bladder Injury - most recent imaging and cysto suggests durable repair.   2 - Metastatic Endometrial Cancer - per GYn oncology.  3 - Right Ureteral Stricture - now s/p operative dilation / large caliber stent placement. I am cautiously optimistic  this will result in durable response. Rec repeat operative eval with cysto / RPG / Diagnostic ureteroscopy with plan for stent removal if area of prior stricture wide patent v. stent exchange if stricture clearly recurred. Risks, benefits, alternatives rediscussed.  Britian Jentz 05/02/2015, 6:16 AM

## 2015-05-02 NOTE — Op Note (Deleted)
[name], [name] NO.:  [id number]  MEDICAL RECORD NO.:  [id number]  LOCATION:  UROLOGY                        FACILITY:  [facility]  PHYSICIAN:  Alexis Frock, MD     DATE OF BIRTH:  July 02, 1956  DATE OF PROCEDURE:  05/02/2015                              OPERATIVE REPORT  PREOPERATIVE DIAGNOSIS:  Right distal ureteral stricture.  PREOPERATIVE DIAGNOSES: 1. Cystoscopy with right retrograde pyelogram interpretation. 2. Diagnostic ureteroscopy. 3. Removal of right ureteral stent.  ESTIMATED BLOOD LOSS:  Nil.  COMPLICATIONS:  None.  SPECIMENS:  None.  FINDINGS: 1. Relative narrowing and mild tortuosity of the distal fourth of the     ureter likely from iliac vessels and inferiorly by retrograde     pyelogram. 2. No left hydronephrosis. 3. Patent right ureter usually accommodating 10-French scope and wire     throughout the area of relative narrowing.  No obvious mucosal     abnormalities, it was felt that trial of stent free would be     warranted. 4. Unremarkable urinary bladder.  INDICATION:  [name] is a very pleasant 59 year old lady with history of obesity and metastatic gynecologic cancer, status post surgery and radiation earlier this year.  She unfortunately has developed a right distal ureteral stricture close to the area of the iliac crossing.  This has been managed initially with stenting in a trial of stent free.  She subsequently developed recurrent hydronephrosis and was found to have recurrent right stricture.  She underwent balloon dilation for last month. She now presents for diagnostic ureteroscopy to asses on the success of her balloon dilation and stent exchange versus removal. Informed consent was obtained and placed in medical record.  PROCEDURE IN DETAIL:  The patient being [facility] verified. Procedure being cysto right retrograde right diagnostic ureteroscopy stent exchange versus removal was confirmed.  Procedure was  carried out. Time-out was performed.  Intravenous antibiotics were administered.  LMA anesthesia induced patient was introduced.  The was placed into a low lithotomy position.  Sterile field was created by prepping and draping the patient's vagina, introitus, and proximal thighs using iodine x3. Next, cystourethroscopy was performed using a 23-French rigid cystoscope with 30-degree offset lens.  The distal end of the ureteral stent was seen in situ.  This was grasped and brought to the level of urethral meatus, through which a 0.038 zip wire was advanced at the level of the upper pole and set aside as a safety wire.  Next, semi-rigid ureteroscopy was performed of the distal fifth of the right ureter alongside a separate Sensor working wire.  No mucosal abnormalities were found.  There was some tortuosity and rigidity in the distal ureter consistent likely radiation changes that did not allow rigid ureteroscopy to the level of the iliac, it was exchanged for an open- ended catheter right retrograde pyelogram was obtained.  Right retrograde pyelogram demonstrated a single right ureter, single system right kidney.  There was some relative tortuosity medial deviation of the distal ureter without hydroureteronephrosis.  Next, BOA single channel flexible ureteroscope was exchanged using fluoroscopic guidance over the Sensor working wire to the level of the proximal ureter and flexible ureteroscopy was  performed in the entire length of the right ureter and this revealed as expected in the area of relative, but mild narrowing in the area of prior stricture, this appeared to be the level of the iliac below, this was smooth walled, nonfriable, and completely mucosalized and easily did accommodate the ureteroscope and safety wire.  Given the relative patency and excellent mucosalization, it was felt that a trial of stent free would be warranted as it be prior balloon dilation appears to have been  successful.  As such the ureteroscope was completely removed as a safety wire.  Bladder was emptied per cystoscope.  The procedure was terminated.  The patient tolerated the procedure well. There were no immediate periprocedural complications.  The patient was taken to the postanesthesia care unit in stable condition.          ______________________________ Alexis Frock, MD     TM/MEDQ  D:  05/02/2015  T:  05/02/2015  Job:  [id number]

## 2015-05-02 NOTE — Op Note (Signed)
NAMETRINKA, HASTEY NO.:  [id number]  MEDICAL RECORD NO.:  [id number]  LOCATION:                               FACILITY:  [facility]  PHYSICIAN:  Alexis Frock, MD     DATE OF BIRTH:  [birth year]  DATE OF PROCEDURE:  05/02/2015                               OPERATIVE REPORT  PREOPERATIVE DIAGNOSIS:  Right distal ureteral stricture.  PREOPERATIVE DIAGNOSES: 1. Cystoscopy with right retrograde pyelogram interpretation. 2. Diagnostic ureteroscopy. 3. Removal of right ureteral stent.  ESTIMATED BLOOD LOSS:  Nil.  COMPLICATIONS:  None.  SPECIMENS:  None.  FINDINGS: 1. Relative narrowing and mild tortuosity of the distal fourth of the     ureter likely at iliac vessels and inferiorly by retrograde     pyelogram. 2. No left hydronephrosis. 3. Patent right ureter usually accommodating 10-French scope and wire     throughout the area of relative narrowing.  No obvious mucosal     abnormalities, it was felt that trial of stent free would be     warranted. 4. Unremarkable urinary bladder.  INDICATION:  [name] is a very pleasant 59 year old lady with history of obesity and metastatic gynecologic cancer, status post surgery and radiation earlier this year.  She unfortunately has developed a right distal ureteral stricture pretty closely to the area of the iliac crossing.  This has been managed initially with stenting in a trial of stent free.  She subsequently developed recurrent hydronephrosis and was found to have recurrent right stricture.  She underwent balloon dilation for last month. She now presents for diagnostic ureteroscopy to asses on the success of her balloon dilation and stent exchange versus removal. Informed consent was obtained and placed in medical record.  PROCEDURE IN DETAIL:  The patient being [name] verified. Procedure being cysto right retrograde right diagnostic ureteroscopy stent exchange versus removal was confirmed.  Procedure  was carried out. Time-out was performed.  Intravenous antibiotics were administered.  LMA anesthesia induced patient was introduced.  The was placed into a low lithotomy position.  Sterile field was created by prepping and draping the patient's vagina, introitus, and proximal thighs using iodine x3. Next, cystourethroscopy was performed using a 23-French rigid cystoscope with 30-degree offset lens.  The distal end of the ureteral stent was seen in situ.  This was grasped and brought to the level of urethral meatus, through which a 0.038 zip wire was advanced at the level of the upper pole and set aside as a safety wire.  Next, semi-rigid ureteroscopy was performed of the distal fifth of the right ureter alongside a separate Sensor working wire.  No mucosal abnormalities were found.  There was some tortuosity and rigidity in the distal ureter consistent likely radiation changes that did not allow rigid ureteroscopy to the level of the iliac, it was exchanged for an open- ended catheter right retrograde pyelogram was obtained.  Right retrograde pyelogram demonstrated a single right ureter, single [name] right kidney.  There was some relative tortuosity medial deviation of the distal ureter without hydroureteronephrosis.  Next, BOA single channel flexible ureteroscope was exchanged using fluoroscopic guidance over the Sensor working wire to the level of  the proximal ureter and flexible ureteroscopy was performed in the entire length of the right ureter and this revealed as expected in the area of relative, but mild narrowing in the area of prior stricture, this appeared to be the level of the iliac below, this was smooth walled, nonfriable, and completely mucosalized and easily did accommodate the ureteroscope and safety wire.  Given the relative patency and excellent mucosalization, it was felt that a trial of stent free would be warranted as it be prior balloon dilation appears to have  been successful.  As such the ureteroscope was completely removed as a safety wire.  Bladder was emptied per cystoscope.  The procedure was terminated.  The patient tolerated the procedure well. There were no immediate periprocedural complications.  The patient was taken to the postanesthesia care unit in stable condition.          ______________________________ Alexis Frock, MD     TM/MEDQ  D:  05/02/2015  T:  05/02/2015  Job:  [id number]

## 2015-05-05 ENCOUNTER — Encounter (HOSPITAL_BASED_OUTPATIENT_CLINIC_OR_DEPARTMENT_OTHER): Payer: Self-pay | Admitting: Urology

## 2015-05-08 ENCOUNTER — Other Ambulatory Visit (HOSPITAL_BASED_OUTPATIENT_CLINIC_OR_DEPARTMENT_OTHER): Payer: BLUE CROSS/BLUE SHIELD

## 2015-05-08 ENCOUNTER — Ambulatory Visit (HOSPITAL_BASED_OUTPATIENT_CLINIC_OR_DEPARTMENT_OTHER): Payer: BLUE CROSS/BLUE SHIELD

## 2015-05-08 VITALS — BP 115/58 | HR 70 | Temp 98.3°F | Resp 16

## 2015-05-08 DIAGNOSIS — D709 Neutropenia, unspecified: Secondary | ICD-10-CM

## 2015-05-08 LAB — CBC & DIFF AND RETIC
BASO%: 4.8 % — ABNORMAL HIGH (ref 0.0–2.0)
BASOS ABS: 0.1 10*3/uL (ref 0.0–0.1)
EOS%: 11.5 % — AB (ref 0.0–7.0)
Eosinophils Absolute: 0.1 10*3/uL (ref 0.0–0.5)
HEMATOCRIT: 35.4 % (ref 34.8–46.6)
HEMOGLOBIN: 11.2 g/dL — AB (ref 11.6–15.9)
Immature Retic Fract: 6.7 % (ref 1.60–10.00)
LYMPH%: 46.2 % (ref 14.0–49.7)
MCH: 27.9 pg (ref 25.1–34.0)
MCHC: 31.6 g/dL (ref 31.5–36.0)
MCV: 88.1 fL (ref 79.5–101.0)
MONO#: 0.3 10*3/uL (ref 0.1–0.9)
MONO%: 29.8 % — AB (ref 0.0–14.0)
NEUT#: 0.1 10*3/uL — CL (ref 1.5–6.5)
NEUT%: 7.7 % — AB (ref 38.4–76.8)
Platelets: 261 10*3/uL (ref 145–400)
RBC: 4.02 10*6/uL (ref 3.70–5.45)
RDW: 16.4 % — ABNORMAL HIGH (ref 11.2–14.5)
RETIC %: 1.13 % (ref 0.70–2.10)
Retic Ct Abs: 45.43 10*3/uL (ref 33.70–90.70)
WBC: 1 10*3/uL — ABNORMAL LOW (ref 3.9–10.3)
lymph#: 0.5 10*3/uL — ABNORMAL LOW (ref 0.9–3.3)
nRBC: 0 % (ref 0–0)

## 2015-05-08 MED ORDER — TBO-FILGRASTIM 480 MCG/0.8ML ~~LOC~~ SOSY
480.0000 ug | PREFILLED_SYRINGE | SUBCUTANEOUS | Status: DC
  Administered 2015-05-08: 480 ug via SUBCUTANEOUS
  Filled 2015-05-08: qty 0.8

## 2015-05-14 ENCOUNTER — Encounter (HOSPITAL_COMMUNITY): Payer: Self-pay

## 2015-05-14 ENCOUNTER — Ambulatory Visit (HOSPITAL_COMMUNITY)
Admission: RE | Admit: 2015-05-14 | Discharge: 2015-05-14 | Disposition: A | Discharge status: Home / Self Care | Payer: BLUE CROSS/BLUE SHIELD | Source: Ambulatory Visit | Attending: Gynecologic Oncology | Admitting: Gynecologic Oncology

## 2015-05-14 DIAGNOSIS — N1330 Unspecified hydronephrosis: Secondary | ICD-10-CM | POA: Diagnosis not present

## 2015-05-14 DIAGNOSIS — C541 Malignant neoplasm of endometrium: Secondary | ICD-10-CM | POA: Diagnosis present

## 2015-05-14 MED ORDER — IOHEXOL 300 MG/ML  SOLN
100.0000 mL | Freq: Once | INTRAMUSCULAR | Status: AC | PRN
  Administered 2015-05-14: 100 mL via INTRAVENOUS

## 2015-05-15 ENCOUNTER — Other Ambulatory Visit: Payer: Self-pay | Admitting: Hematology and Oncology

## 2015-05-15 ENCOUNTER — Telehealth: Payer: Self-pay | Admitting: Gynecologic Oncology

## 2015-05-15 ENCOUNTER — Ambulatory Visit (HOSPITAL_BASED_OUTPATIENT_CLINIC_OR_DEPARTMENT_OTHER): Payer: BLUE CROSS/BLUE SHIELD | Admitting: Hematology and Oncology

## 2015-05-15 ENCOUNTER — Ambulatory Visit (HOSPITAL_BASED_OUTPATIENT_CLINIC_OR_DEPARTMENT_OTHER): Payer: BLUE CROSS/BLUE SHIELD

## 2015-05-15 ENCOUNTER — Other Ambulatory Visit (HOSPITAL_BASED_OUTPATIENT_CLINIC_OR_DEPARTMENT_OTHER): Payer: BLUE CROSS/BLUE SHIELD

## 2015-05-15 ENCOUNTER — Encounter: Payer: Self-pay | Admitting: Hematology and Oncology

## 2015-05-15 ENCOUNTER — Telehealth: Payer: Self-pay | Admitting: Hematology and Oncology

## 2015-05-15 ENCOUNTER — Other Ambulatory Visit: Payer: Self-pay | Admitting: *Deleted

## 2015-05-15 VITALS — BP 119/59 | HR 71 | Resp 17 | Ht 62.0 in | Wt 227.8 lb

## 2015-05-15 DIAGNOSIS — D709 Neutropenia, unspecified: Secondary | ICD-10-CM | POA: Diagnosis not present

## 2015-05-15 DIAGNOSIS — N1330 Unspecified hydronephrosis: Secondary | ICD-10-CM | POA: Diagnosis not present

## 2015-05-15 DIAGNOSIS — D61818 Other pancytopenia: Secondary | ICD-10-CM

## 2015-05-15 DIAGNOSIS — Z8543 Personal history of malignant neoplasm of ovary: Secondary | ICD-10-CM | POA: Diagnosis not present

## 2015-05-15 LAB — CBC & DIFF AND RETIC
BASO%: 2 % (ref 0.0–2.0)
Basophils Absolute: 0 10*3/uL (ref 0.0–0.1)
EOS%: 5.4 % (ref 0.0–7.0)
Eosinophils Absolute: 0.1 10*3/uL (ref 0.0–0.5)
HEMATOCRIT: 35.8 % (ref 34.8–46.6)
HGB: 11.4 g/dL — ABNORMAL LOW (ref 11.6–15.9)
Immature Retic Fract: 13.4 % — ABNORMAL HIGH (ref 1.60–10.00)
LYMPH#: 0.3 10*3/uL — AB (ref 0.9–3.3)
LYMPH%: 20.3 % (ref 14.0–49.7)
MCH: 27.6 pg (ref 25.1–34.0)
MCHC: 31.8 g/dL (ref 31.5–36.0)
MCV: 86.7 fL (ref 79.5–101.0)
MONO#: 0.6 10*3/uL (ref 0.1–0.9)
MONO%: 38.5 % — ABNORMAL HIGH (ref 0.0–14.0)
NEUT%: 33.8 % — ABNORMAL LOW (ref 38.4–76.8)
NEUTROS ABS: 0.5 10*3/uL — AB (ref 1.5–6.5)
PLATELETS: 243 10*3/uL (ref 145–400)
RBC: 4.13 10*6/uL (ref 3.70–5.45)
RDW: 16 % — AB (ref 11.2–14.5)
RETIC %: 1.68 % (ref 0.70–2.10)
RETIC CT ABS: 69.38 10*3/uL (ref 33.70–90.70)
WBC: 1.5 10*3/uL — AB (ref 3.9–10.3)
nRBC: 0 % (ref 0–0)

## 2015-05-15 MED ORDER — TBO-FILGRASTIM 480 MCG/0.8ML ~~LOC~~ SOSY
480.0000 ug | PREFILLED_SYRINGE | SUBCUTANEOUS | Status: DC
  Administered 2015-05-15: 480 ug via SUBCUTANEOUS
  Filled 2015-05-15: qty 0.8

## 2015-05-15 MED ORDER — FILGRASTIM 480 MCG/1.6ML IJ SOLN: 480.0000 ug | INTRAMUSCULAR | Status: DC

## 2015-05-15 NOTE — Assessment & Plan Note (Signed)
The patient has completed her radiation treatment. She had recent stent placement for hydronephrosis. Due to her history of recurrent infection, I recommend giving her G-CSF weekly for now. The goal would be to keep the Twin greater than 1500 to reduce the risk of infection. I will try to see if her insurance will allow her to give herself injection of G-CSF at home and that would reduce her frequent travel. We also discussed vaccination program and she is up to date

## 2015-05-15 NOTE — Assessment & Plan Note (Signed)
The patient had unilateral hydronephrosis noted on the CT scan. She has appointment to see urologist next week. I would defer to them for further management.

## 2015-05-15 NOTE — Telephone Encounter (Signed)
Informed patient of CT findings of stable inflammation consistent with post treatment effect. No sign of definitive recurrence/progression. I favor treatment effect.  WIll see her as planned, no intervention indicated at present.  She will see Dr Tresa Moore in a few days regarding her hydroureter.  Donaciano Eva, MD

## 2015-05-15 NOTE — Assessment & Plan Note (Signed)
This is likely anemia of chronic disease and possibly related to her recent exposure to radiation. The patient denies recent history of bleeding such as epistaxis, hematuria or hematochezia. She is asymptomatic from the anemia

## 2015-05-15 NOTE — Assessment & Plan Note (Signed)
She has completed all her adjuvant treatment. CT scan of the abdomen and pelvis from 05/14/2015 was reviewed which show no evidence of residual disease. I will defer to GYN Onc for future follow-up

## 2015-05-15 NOTE — Progress Notes (Signed)
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 Going to the patient, initial diagnosis with ovarian cancer was when she presented with abdomen and no swelling and pelvic pain leading to her urgent care visit. She states that the tumor had caused torsion of the ovary. She cannot remember the stage of her disease. She began to develop leukopenia in approximately July of 2010. She underwent an initial bone marrow biopsy at [facility] in [place] on 03/17/2009. but it was nondiagnostic. No gross dysplastic changes, no excess blasts, normal cytogenetics.  Her counts have been stable over observation through this office since December 2010 with some minor fluctuations. Total white counts run as low as 1800 and as high as 2900. Percent neutrophils fluctuates very widely from as low as 4% recorded on a CBC done 02/03/2010 to as high as 31% with average being 20%. She was hospitalized in the ICU at that time  Both hemoglobin and platelets have remained stable over time.  Despite leukopenia, she has not had any recurrent or severe infections. She stated strong family history of lupus in a cousin. She complained of occasional skin rash and joint stiffness. 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 She received G-CSF in January of 2015 to stimulate white blood cell production around a colonoscopy procedure a few years ago. She had a limited response. On 01/23/2014, repeat bone marrow biopsy excluded lymphoma or leukemia. On 09/12/2014, CT scan of the abdomen and pelvis show possible relapse of gynecological Malignancy On 10/14/2014, MRI of the pelvis show significant pelvic mass without invasion into the bladder but abutting to the rectum On 11/19/2014, she underwent surgery and had robotic-assisted lysis of adhesions, converted to laparotomy, radical upper vaginectomy and low anterior resection with colostomy. Bilateral ureteral stent placement and cystotomy repair. 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 Resection margins were negative. She was hospitalized from 11/19/2014 to 11/25/2014. In May, showed recurrent admission to the hospital due to complication with infection. On 01/29/2015, she began adjuvant radiation treatment On 03/03/2015, CT scan of the abdomen and pelvis showed status post interval removal of the bilateral nephroureteral stents. Worsening moderate right hydroureteronephrosis. Resolved left hydroureteronephrosis. On 03/20/2015, she had cystoscopy and stent placement for right hydronephrosis On 05/14/2015, CT scan of the abdomen and pelvis show unilateral right hydronephrosis with no residual cancer  INTERVAL HISTORY: Please see below for problem oriented charting. She feels well. Denies recent infection. She denies recent pelvic pain.  REVIEW OF SYSTEMS:   Constitutional: Denies fevers, chills or abnormal weight loss Eyes: Denies blurriness of vision Ears, nose, mouth, throat, and face: Denies mucositis or sore throat Respiratory: Denies cough, dyspnea or wheezes Cardiovascular: Denies palpitation, chest discomfort or lower extremity  swelling Gastrointestinal:  Denies nausea, heartburn or change in bowel habits Skin: Denies abnormal skin rashes Lymphatics: Denies new lymphadenopathy or easy bruising Neurological:Denies  numbness, tingling or new weaknesses Behavioral/Psych: Mood is stable, no new changes  All other systems were reviewed with the patient and are negative.  I have reviewed the past medical history, past surgical history, social history and family history with the patient and they are unchanged from previous note.  ALLERGIES:  is allergic to penicillins; ultram; adhesive; cefaclor; erythromycin; trimethoprim; pectin; and sulfa antibiotics.  MEDICATIONS:  Current Outpatient Prescriptions  Medication Sig Dispense Refill  . Alum & Mag Hydroxide-Simeth (MAGIC MOUTHWASH) SOLN Take 5 mLs by mouth daily as needed for mouth pain (mouth pain).    . Biotin 5 MG TABS Take 1 tablet by mouth daily.     . Calcium Carbonate-Vitamin D (CALCIUM + D PO) Take 2 tablets by mouth daily.    . Canagliflozin (INVOKANA) 300 MG TABS Take 1 tablet by mouth every morning.     . Cholecalciferol (VITAMIN D3) 10000 UNITS capsule Take 10,000 Units by mouth once a week. Sundays    . diphenhydrAMINE (BENADRYL) 25 MG tablet Take 25 mg by mouth every 4 (four) hours as needed.    . diphenoxylate-atropine (LOMOTIL) 2.5-0.025 MG per tablet Take 1 tablet by mouth 4 (four) times daily as needed for diarrhea or loose stools. 60 tablet 0  . filgrastim (NEUPOGEN) 480 MCG/1.6ML injection Inject 1.6 mLs (480 mcg total) into the skin once a week. 1.6 mL 12  . fluticasone (FLOVENT HFA) 110 MCG/ACT inhaler Inhale 1 puff into the lungs as needed.     . hyaluronate sodium (RADIAPLEXRX) GEL Apply 1 application topically 2 (two) times daily as needed.     [name] Kitchen ibuprofen (ADVIL,MOTRIN) 800 MG tablet Take 800 mg by mouth every 8 (eight) hours as needed for moderate pain (pain).    [name] Kitchen levothyroxine (SYNTHROID) 175 MCG tablet Take 175 mcg by mouth daily before  breakfast.    . loperamide (IMODIUM) 2 MG capsule Take by mouth as needed for diarrhea or loose stools.    [name] Kitchen loratadine (CLARITIN) 10 MG tablet Take 10 mg by mouth daily.    . metFORMIN (GLUCOPHAGE) 1000 MG tablet Take 1,000 mg by mouth 2 (two) times daily with a meal.     . Multiple Vitamin (MULTIVITAMIN WITH MINERALS) TABS tablet Take 1 tablet by mouth daily.    . naproxen sodium (ANAPROX) 220 MG tablet Take 220 mg by mouth 2 (two) times daily with a meal.    . omega-3 acid ethyl esters (LOVAZA) 1 G capsule Take 1 g by mouth 2 (two) times daily.    [name] Kitchen omeprazole (PRILOSEC) 20 MG capsule Take 20 mg by mouth at bedtime.     . ondansetron (ZOFRAN) 4 MG tablet Take 1 tablet (4 mg total) by mouth every 6 (six) hours as needed for nausea. 20 tablet 0  . oxyCODONE (OXY IR/ROXICODONE) 5 MG immediate release tablet Take 1 tablet (5 mg total) by mouth every 4 (four) hours as needed for moderate pain or severe pain. Post-operatively 15 tablet 0  . phenazopyridine (PYRIDIUM) 200 MG tablet Take 200 mg by mouth 3 (three) times daily as needed for pain. Called in to [facility] per Dr. Sondra Come.  Disp. 30 tablets. 2 refills.    Vladimir Faster Glycol-Propyl Glycol (SYSTANE OP) Apply 1 drop to eye daily as needed (Dry eyes).     . rosuvastatin (CRESTOR) 10 MG tablet Take 10 mg by mouth every evening.     . sitaGLIPtin (JANUVIA) 100 MG tablet Take 100 mg by mouth daily.    [name] Kitchen  Tbo-Filgrastim (GRANIX) 480 MCG/0.8ML SOSY injection Inject 480 mcg into the skin once a week. @[facility]     . valsartan (DIOVAN) 320 MG tablet Take 320 mg by mouth every morning.      No current facility-administered medications for this visit.    PHYSICAL EXAMINATION: ECOG PERFORMANCE STATUS: 0 - Asymptomatic  Filed Vitals:   05/15/15 1224  BP: 119/59  Pulse: 71  Resp: 17   Filed Weights   05/15/15 1224  Weight: 227 lb 12.8 oz (103.329 kg)    GENERAL:alert, no distress and comfortable SKIN: skin color, texture, turgor are normal, no  rashes or significant lesions EYES: normal, Conjunctiva are pink and non-injected, sclera clear Musculoskeletal:no cyanosis of digits and no clubbing  NEURO: alert & oriented x 3 with fluent speech, no focal motor/sensory deficits  LABORATORY DATA:  I have reviewed the data as listed    Component Value Date/Time   NA 140 05/02/2015 0648   NA 142 04/21/2015 1133   K 3.5 05/02/2015 0648   K 4.0 04/21/2015 1133   CL 101 05/02/2015 0648   CO2 28 04/21/2015 1133   CO2 24 01/06/2015 1610   GLUCOSE 115* 05/02/2015 0648   GLUCOSE 110 04/21/2015 1133   BUN 16 05/02/2015 0648   BUN 16.7 04/21/2015 1133   CREATININE 0.80 05/02/2015 0648   CREATININE 0.8 04/21/2015 1133   CREATININE 0.80 01/06/2015 1610   CALCIUM 9.6 04/21/2015 1133   CALCIUM 9.6 01/06/2015 1610   PROT 6.7 12/31/2014 0638   PROT 6.4 11/29/2014 1251   ALBUMIN 2.8* 12/31/2014 0638   ALBUMIN 2.6* 11/29/2014 1251   AST 11* 12/31/2014 0638   AST 15 11/29/2014 1251   ALT 12* 12/31/2014 0638   ALT 17 11/29/2014 1251   ALKPHOS 57 12/31/2014 0638   ALKPHOS 77 11/29/2014 1251   BILITOT 0.6 12/31/2014 0638   BILITOT 0.34 11/29/2014 1251   GFRNONAA >60 01/02/2015 0400   GFRNONAA 78 12/16/2014 1530   GFRAA >60 01/02/2015 0400   GFRAA >89 12/16/2014 1530    No results found for: SPEP, UPEP  Lab Results  Component Value Date   WBC 1.5* 05/15/2015   NEUTROABS 0.5* 05/15/2015   HGB 11.4* 05/15/2015   HCT 35.8 05/15/2015   MCV 86.7 05/15/2015   PLT 243 05/15/2015      Chemistry      Component Value Date/Time   NA 140 05/02/2015 0648   NA 142 04/21/2015 1133   K 3.5 05/02/2015 0648   K 4.0 04/21/2015 1133   CL 101 05/02/2015 0648   CO2 28 04/21/2015 1133   CO2 24 01/06/2015 1610   BUN 16 05/02/2015 0648   BUN 16.7 04/21/2015 1133   CREATININE 0.80 05/02/2015 0648   CREATININE 0.8 04/21/2015 1133   CREATININE 0.80 01/06/2015 1610      Component Value Date/Time   CALCIUM 9.6 04/21/2015 1133   CALCIUM 9.6  01/06/2015 1610   ALKPHOS 57 12/31/2014 0638   ALKPHOS 77 11/29/2014 1251   AST 11* 12/31/2014 0638   AST 15 11/29/2014 1251   ALT 12* 12/31/2014 0638   ALT 17 11/29/2014 1251   BILITOT 0.6 12/31/2014 0638   BILITOT 0.34 11/29/2014 1251       RADIOGRAPHIC STUDIES: I reviewed imaging with the patient I have personally reviewed the radiological images as listed and agreed with the findings in the report. Ct Abdomen Pelvis W Contrast  05/14/2015  CLINICAL DATA:  59 year old female with history of endometrial carcinoma diagnosed in 2006  with a recurrence in May 2016. Chemotherapy completed in 2006, with recent radiation therapy completed in August 2016. Status post total abdominal hysterectomy, colonic resection with colonoscopy and appendectomy. Lower abdominal pain and nausea. EXAM: CT ABDOMEN AND PELVIS WITH CONTRAST TECHNIQUE: Multidetector CT imaging of the abdomen and pelvis was performed using the standard protocol following bolus administration of intravenous contrast. CONTRAST:  132m OMNIPAQUE IOHEXOL 300 MG/ML  SOLN COMPARISON:  Multiple priors, most recently CT of the abdomen and pelvis 03/03/2015. FINDINGS: Lower chest:  Unremarkable. Hepatobiliary: No cystic or solid hepatic lesions. No intra or extrahepatic biliary ductal dilatation. Status post cholecystectomy. Pancreas: No pancreatic mass. No pancreatic ductal dilatation. No pancreatic or peripancreatic fluid or inflammatory changes. Spleen: Unremarkable. Adrenals/Urinary Tract: Bilateral adrenal glands and the left kidney are normal in appearance. Mild to moderate right hydroureteronephrosis, similar to the prior examination, presumably related to extrinsic compression or traction on the distal third of the right ureter in the area of prior treatment (amorphous presacral soft tissue encases the distal third of the right ureter). There is mild enhancement of the urothelium in the proximal right ureter. No perinephric stranding around  the right kidney at this time. Base of the urinary bladder is low lying, and there is pelvic floor laxity, indicative of a cystocele. Stomach/Bowel: Normal appearance of the stomach. No pathologic dilatation of small bowel or colon. Status post left hemicolectomy with left upper quadrant colostomy. Vascular/Lymphatic: No significant atherosclerotic disease, aneurysm or dissection identified in the abdominal or pelvic vasculature. The continues to be extensive amorphous soft tissue thickening in the low anatomic pelvis extending from the vaginal cuff into the presacral space, similar to the prior examination, favored to predominantly reflect evolving postradiation changes and edema. No discrete soft tissue mass is confidently identified on today's examination to indicate residual tumor (although small deposits of tumor in this region or enlarged lymph nodes are not excluded). No other definite lymphadenopathy noted in the remaining portions of the abdomen or pelvis. Numerous surgical clips throughout the posterior anatomic pelvis from prior lymph node dissection. Reproductive: Status post total abdominal hysterectomy and bilateral salpingo oophorectomy. Other: No significant volume of ascites.  No pneumoperitoneum. Musculoskeletal: There are no aggressive appearing lytic or blastic lesions noted in the visualized portions of the skeleton. IMPRESSION: 1. Post treatment related changes in the low anatomic pelvis with extensive amorphous soft tissue thickening extending from the vaginal cuff into the presacral space, similar to the prior examination, favored to be a combination of post surgical and post radiation change. While residual disease is not excluded in these regions, no definite site of residual/recurrent disease is confidently identified on today's examination. 2. Persistent mild to moderate right-sided hydroureteronephrosis, presumably related to narrowing of the distal third of the right ureter from the  above described process in the low anatomic pelvis which appears to engulf the ureter. 3. Additional incidental findings, similar prior study, as above. Electronically Signed   By: DVinnie LangtonM.D.   On: 05/14/2015 13:18     ASSESSMENT & PLAN:  Chronic neutropenia The patient has completed her radiation treatment. She had recent stent placement for hydronephrosis. Due to her history of recurrent infection, I recommend giving her G-CSF weekly for now. The goal would be to keep the AHallettsvillegreater than 1500 to reduce the risk of infection. I will try to see if her insurance will allow her to give herself injection of G-CSF at home and that would reduce her frequent travel. We also discussed vaccination program  and she is up to date  Pancytopenia, acquired [facility]) This is likely anemia of chronic disease and possibly related to her recent exposure to radiation. The patient denies recent history of bleeding such as epistaxis, hematuria or hematochezia. She is asymptomatic from the anemia  History of ovarian cancer She has completed all her adjuvant treatment. CT scan of the abdomen and pelvis from 05/14/2015 was reviewed which show no evidence of residual disease. I will defer to GYN Onc for future follow-up  Hydronephrosis, right The patient had unilateral hydronephrosis noted on the CT scan. She has appointment to see urologist next week. I would defer to them for further management. The patient would like a referral to family practice to establish new primary care. I will refer her per patient request  Orders Placed This Encounter  Procedures  . Ambulatory referral to Family Practice    Referral Priority:  Routine    Referral Type:  Consultation    Referral Reason:  Specialty Services Required    Referred to Provider:  Rosalita Chessman, DO    Requested Specialty:  Family Medicine    Number of Visits Requested:  1   All questions were answered. The patient knows to call the clinic with  any problems, questions or concerns. No barriers to learning was detected. I spent 25 minutes counseling the patient face to face. The total time spent in the appointment was 30 minutes and more than 50% was on counseling and review of test results     [facility], Justa Hatchell, MD 05/15/2015 1:11 PM

## 2015-05-15 NOTE — Telephone Encounter (Signed)
Gave adn printed appt sched and avs for pt for OCT thru April 2017

## 2015-05-15 NOTE — Telephone Encounter (Signed)
Per [facility],  Neupogen rx has to be filled at a Specialty Pharmacy per [employer].  I asked pt which Specialty pharmacy and she says she using [facility] for her other meds.  So I faxed Neupogen to [facility] which is the [facility]ty [facility] for [facility].   Faxed to [phone].

## 2015-05-20 ENCOUNTER — Other Ambulatory Visit: Payer: Self-pay | Admitting: *Deleted

## 2015-05-21 ENCOUNTER — Telehealth: Payer: Self-pay | Admitting: Hematology and Oncology

## 2015-05-21 ENCOUNTER — Other Ambulatory Visit: Payer: Self-pay | Admitting: Hematology and Oncology

## 2015-05-21 NOTE — Telephone Encounter (Signed)
Spoke with patient and she is aware of her appoinments

## 2015-05-22 ENCOUNTER — Other Ambulatory Visit: Payer: BLUE CROSS/BLUE SHIELD

## 2015-05-22 ENCOUNTER — Ambulatory Visit: Payer: BLUE CROSS/BLUE SHIELD

## 2015-05-29 ENCOUNTER — Other Ambulatory Visit: Payer: BLUE CROSS/BLUE SHIELD

## 2015-05-29 ENCOUNTER — Ambulatory Visit: Payer: BLUE CROSS/BLUE SHIELD

## 2015-06-04 ENCOUNTER — Encounter: Payer: Self-pay | Admitting: Physician Assistant

## 2015-06-04 ENCOUNTER — Ambulatory Visit (INDEPENDENT_AMBULATORY_CARE_PROVIDER_SITE_OTHER): Payer: BLUE CROSS/BLUE SHIELD | Admitting: Physician Assistant

## 2015-06-04 VITALS — BP 129/63 | HR 71 | Temp 97.6°F | Resp 16 | Ht 62.0 in | Wt 228.5 lb

## 2015-06-04 DIAGNOSIS — B9689 Other specified bacterial agents as the cause of diseases classified elsewhere: Secondary | ICD-10-CM | POA: Insufficient documentation

## 2015-06-04 DIAGNOSIS — J208 Acute bronchitis due to other specified organisms: Principal | ICD-10-CM

## 2015-06-04 DIAGNOSIS — J00 Acute nasopharyngitis [common cold]: Secondary | ICD-10-CM

## 2015-06-04 MED ORDER — LEVOFLOXACIN 500 MG PO TABS: 500.0000 mg | ORAL_TABLET | Freq: Every day | ORAL | Status: DC

## 2015-06-04 MED ORDER — BENZONATATE 100 MG PO CAPS: 100.0000 mg | ORAL_CAPSULE | Freq: Two times a day (BID) | ORAL | Status: DC | PRN

## 2015-06-04 NOTE — Assessment & Plan Note (Signed)
Rx Levaquin.  Increase fluids.  Rest.  Saline nasal spray.  Probiotic.   Humidifier in bedroom. Tessalon per orders.  Call or return to clinic if symptoms are not improving.

## 2015-06-04 NOTE — Progress Notes (Signed)
Pre visit review using our clinic review tool, if applicable. No additional management support is needed unless otherwise documented below in the visit note/SLS  

## 2015-06-04 NOTE — Progress Notes (Signed)
Patient with history of neutropenia presents to clinic today c/o 1.5 weeks of chest congestion and productive cough with fatigue. Denies fever, chills, chest pain or SOB. Endorses some mild wheezing since this AM. Symptoms are worsening daily per daily.  Past Medical History  Diagnosis Date  . Benign essential HTN 12/14/2011  . Multiple thyroid nodules     Managed by Dr. Gerrit Friends  . GERD (gastroesophageal reflux disease)   . Depression   . Dysuria   . Radiation-induced dermatitis     contact dermatitis , radiation completed, rash only on ankles now.  . History of gastric polyp     2014  duodenum  . History of radiation therapy     01-29-2015 to 03-10-2015  pelvis 50.4Gy  . Hypothyroidism   . DM type 2 (diabetes mellitus, type 2) (HCC)   . Hiatal hernia   . Anemia in neoplastic disease   . Chronic idiopathic neutropenia (HCC)     presumed related to chemotherapy March 2006--- followed by dr Bertis Ruddy   . 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 Ureteral stricture, right   . S/P colostomy [facility])     Current Outpatient Prescriptions on File Prior to Visit  Medication Sig Dispense Refill  . Alum & Mag Hydroxide-Simeth (MAGIC MOUTHWASH) SOLN Take 5 mLs by mouth daily as needed for mouth pain (mouth pain).    . Biotin 5 MG TABS Take 1 tablet by mouth daily.     . Calcium Carbonate-Vitamin D (CALCIUM + D PO) Take 2 tablets by mouth daily.    . Canagliflozin (INVOKANA) 300 MG TABS Take 1 tablet by mouth every morning.     . Cholecalciferol (VITAMIN D3) 10000 UNITS capsule Take 10,000 Units by mouth once a week. Sundays    . diphenhydrAMINE (BENADRYL) 25 MG tablet Take 25 mg by mouth every 4 (four) hours as needed.    . filgrastim (NEUPOGEN) 480 MCG/1.6ML injection Inject 1.6 mLs (480 mcg total) into the skin once a week. 1.6 mL 12  . fluticasone (FLOVENT HFA) 110  MCG/ACT inhaler Inhale 1 puff into the lungs as needed (SOB and Wheezing).     . hyaluronate sodium (RADIAPLEXRX) GEL Apply 1 application topically 2 (two) times daily as needed.     [name] Kitchen ibuprofen (ADVIL,MOTRIN) 800 MG tablet Take 800 mg by mouth every 8 (eight) hours as needed for moderate pain (pain).    [name] Kitchen levothyroxine (SYNTHROID) 175 MCG tablet Take 175 mcg by mouth daily before breakfast.    . loperamide (IMODIUM) 2 MG capsule Take by mouth as needed for diarrhea or loose stools.    [name] Kitchen loratadine (CLARITIN) 10 MG tablet Take 10 mg by mouth daily.    . metFORMIN (GLUCOPHAGE) 1000 MG tablet Take 1,000 mg by mouth 2 (two) times daily with a meal.     . Multiple Vitamin (MULTIVITAMIN WITH MINERALS) TABS tablet Take 1 tablet by mouth daily.    . naproxen sodium (ANAPROX) 220 MG tablet Take 220 mg by mouth daily as needed.     [name] Kitchen omega-3 acid ethyl esters (LOVAZA) 1 G capsule Take 1 g by mouth 2 (two) times daily.    [name] Kitchen omeprazole (PRILOSEC) 20 MG capsule Take 20 mg by mouth at bedtime.     . ondansetron (ZOFRAN) 4 MG tablet Take 1 tablet (4 mg total) by mouth every  6 (six) hours as needed for nausea. 20 tablet 0  . oxyCODONE (OXY IR/ROXICODONE) 5 MG immediate release tablet Take 1 tablet (5 mg total) by mouth every 4 (four) hours as needed for moderate pain or severe pain. Post-operatively 15 tablet 0  . phenazopyridine (PYRIDIUM) 200 MG tablet Take 200 mg by mouth 3 (three) times daily as needed for pain. Called in to [facility] per Dr. Sondra Come.  Disp. 30 tablets. 2 refills.    Vladimir Faster Glycol-Propyl Glycol (SYSTANE OP) Apply 1 drop to eye daily as needed (Dry eyes).     . rosuvastatin (CRESTOR) 10 MG tablet Take 10 mg by mouth every evening.     . sitaGLIPtin (JANUVIA) 100 MG tablet Take 100 mg by mouth daily.    . valsartan (DIOVAN) 320 MG tablet Take 320 mg by mouth every morning.     . diphenoxylate-atropine (LOMOTIL) 2.5-0.025 MG per tablet Take 1 tablet by mouth 4 (four) times daily as needed  for diarrhea or loose stools. (Patient not taking: Reported on 06/04/2015) 60 tablet 0   No current facility-administered medications on file prior to visit.    Allergies  Allergen Reactions  . Penicillins Swelling    Facial swelling  . Ultram [Tramadol] Hives  . Adhesive [Tape]     blisters  . Cefaclor Rash    Ceclor  . Erythromycin     Gastritis, abd cramps  . Trimethoprim Rash  . Pectin Rash    Pectin ring for stoma  . Sulfa Antibiotics Rash    Family History  Problem Relation Age of Onset  . Cancer Mother     stomach ca  . Hypertension Mother   . Cancer Father     prostate ca  . Diabetes Father   . Diabetes Sister   . Hypertension Brother y-10    Social History   Social History  . Marital Status: Married    Spouse Name: N/A  . Number of Children: 1  . Years of Education: N/A   Occupational History  . retired Therapist, sports from [place] History Main Topics  . Smoking status: Never Smoker   . Smokeless tobacco: Never Used  . Alcohol Use: Yes     Comment: rare social  . Drug Use: No  . Sexual Activity: Not Currently   Other Topics Concern  . None   Social History Narrative   Review of Systems  Constitutional: Positive for malaise/fatigue. Negative for fever and weight loss.  HENT: Positive for congestion. Negative for ear pain, hearing loss and sore throat.   Respiratory: Positive for cough, sputum production and wheezing. Negative for hemoptysis and shortness of breath.   Cardiovascular: Negative for chest pain and palpitations.  Neurological: Negative for headaches.   BP 129/63 mmHg  Pulse 71  Temp(Src) 97.6 F (36.4 C) (Oral)  Resp 16  Ht _0  (1.575 m)  Wt 228 lb 8 oz (103.647 kg)  BMI 41.78 kg/m2  SpO2 99%  Physical Exam  Constitutional: She is oriented to person, place, and time and well-developed, well-nourished, and in no distress.  HENT:  Head: Normocephalic and atraumatic.  Right Ear: External ear normal.  Left Ear: External ear  normal.  Nose: Nose normal.  Mouth/Throat: Oropharynx is clear and moist. No oropharyngeal exudate.  TM within normal limits bilaterally.  Eyes: Conjunctivae are normal. Pupils are equal, round, and reactive to light.  Neck: Neck supple.  Cardiovascular: Normal rate, regular rhythm, normal heart sounds and intact distal pulses.  Pulmonary/Chest: Effort normal and breath sounds normal. No respiratory distress. She has no wheezes. She has no rales. She exhibits no tenderness.  Neurological: She is alert and oriented to person, place, and time.  Skin: Skin is warm and dry. No rash noted.  Psychiatric: Affect normal.  Vitals reviewed.   Recent Results (from the past 2160 hour(s))  CBC & Diff and Retic     Status: Abnormal   Collection Time: 03/13/15 12:24 PM  Result Value Ref Range   WBC 1.6 (L) 3.9 - 10.3 10e3/uL   NEUT# 0.6 (L) 1.5 - 6.5 10e3/uL   HGB 10.9 (L) 11.6 - 15.9 g/dL   HCT 34.3 (L) 34.8 - 46.6 %   Platelets 233 145 - 400 10e3/uL   MCV 86.2 79.5 - 101.0 fL   MCH 27.4 25.1 - 34.0 pg   MCHC 31.8 31.5 - 36.0 g/dL   RBC 3.98 3.70 - 5.45 10e6/uL   RDW 18.3 (H) 11.2 - 14.5 %   lymph# 0.2 (L) 0.9 - 3.3 10e3/uL   MONO# 0.5 0.1 - 0.9 10e3/uL   Eosinophils Absolute 0.2 0.0 - 0.5 10e3/uL   Basophils Absolute 0.0 0.0 - 0.1 10e3/uL   NEUT% 38.3 (L) 38.4 - 76.8 %   LYMPH% 13.2 (L) 14.0 - 49.7 %   MONO% 32.1 (H) 0.0 - 14.0 %   EOS% 15.1 (H) 0.0 - 7.0 %   BASO% 1.3 0.0 - 2.0 %   nRBC 0 0 - 0 %   Retic % 2.27 (H) 0.70 - 2.10 %   Retic Ct Abs 90.35 33.70 - 90.70 10e3/uL   Immature Retic Fract 18.40 (H) 1.60 - 10.00 %  CBC & Diff and Retic     Status: Abnormal   Collection Time: 03/19/15 11:08 AM  Result Value Ref Range   WBC 1.7 (L) 3.9 - 10.3 10e3/uL   NEUT# 0.5 (LL) 1.5 - 6.5 10e3/uL   HGB 11.5 (L) 11.6 - 15.9 g/dL   HCT 35.9 34.8 - 46.6 %   Platelets 232 145 - 400 10e3/uL   MCV 86.9 79.5 - 101.0 fL   MCH 27.8 25.1 - 34.0 pg   MCHC 32.0 31.5 - 36.0 g/dL   RBC 4.13 3.70 -  5.45 10e6/uL   RDW 18.4 (H) 11.2 - 14.5 %   lymph# 0.3 (L) 0.9 - 3.3 10e3/uL   MONO# 0.7 0.1 - 0.9 10e3/uL   Eosinophils Absolute 0.2 0.0 - 0.5 10e3/uL   Basophils Absolute 0.0 0.0 - 0.1 10e3/uL   NEUT% 29.1 (L) 38.4 - 76.8 %   LYMPH% 18.6 14.0 - 49.7 %   MONO% 39.5 (H) 0.0 - 14.0 %   EOS% 10.5 (H) 0.0 - 7.0 %   BASO% 2.3 (H) 0.0 - 2.0 %   nRBC 0 0 - 0 %   Retic % 2.19 (H) 0.70 - 2.10 %   Retic Ct Abs 90.45 33.70 - 90.70 10e3/uL   Immature Retic Fract 14.90 (H) 1.60 - 10.00 %  I-STAT, chem 8     Status: Abnormal   Collection Time: 03/20/15 12:00 PM  Result Value Ref Range   Sodium 140 135 - 145 mmol/L   Potassium 3.7 3.5 - 5.1 mmol/L   Chloride 101 101 - 111 mmol/L   BUN 25 (H) 6 - 20 mg/dL   Creatinine, Ser 0.90 0.44 - 1.00 mg/dL   Glucose, Bld 108 (H) 65 - 99 mg/dL   Calcium, Ion 1.26 1.13 - 1.30 mmol/L   TCO2  23 0 - 100 mmol/L   Hemoglobin 12.6 12.0 - 15.0 g/dL   HCT 37.0 36.0 - 46.0 %  Glucose, capillary     Status: Abnormal   Collection Time: 03/20/15  1:44 PM  Result Value Ref Range   Glucose-Capillary 103 (H) 65 - 99 mg/dL  CBC & Diff and Retic     Status: Abnormal   Collection Time: 03/21/15  2:07 PM  Result Value Ref Range   WBC 6.8 3.9 - 10.3 10e3/uL   NEUT# 5.5 1.5 - 6.5 10e3/uL   HGB 11.0 (L) 11.6 - 15.9 g/dL   HCT 34.5 (L) 34.8 - 46.6 %   Platelets 184 145 - 400 10e3/uL   MCV 88.5 79.5 - 101.0 fL   MCH 28.2 25.1 - 34.0 pg   MCHC 31.9 31.5 - 36.0 g/dL   RBC 3.90 3.70 - 5.45 10e6/uL   RDW 19.1 (H) 11.2 - 14.5 %   lymph# 0.5 (L) 0.9 - 3.3 10e3/uL   MONO# 0.7 0.1 - 0.9 10e3/uL   Eosinophils Absolute 0.1 0.0 - 0.5 10e3/uL   Basophils Absolute 0.0 0.0 - 0.1 10e3/uL   NEUT% 79.9 (H) 38.4 - 76.8 %   LYMPH% 7.5 (L) 14.0 - 49.7 %   MONO% 10.8 0.0 - 14.0 %   EOS% 1.5 0.0 - 7.0 %   BASO% 0.3 0.0 - 2.0 %   Retic % 2.02 0.70 - 2.10 %   Retic Ct Abs 78.78 33.70 - 90.70 10e3/uL   Immature Retic Fract 10.20 (H) 1.60 - 10.00 %  CBC & Diff and Retic     Status:  Abnormal   Collection Time: 03/27/15  2:18 PM  Result Value Ref Range   WBC 1.4 (L) 3.9 - 10.3 10e3/uL   NEUT# 0.4 (LL) 1.5 - 6.5 10e3/uL   HGB 11.4 (L) 11.6 - 15.9 g/dL   HCT 35.9 34.8 - 46.6 %   Platelets 245 145 - 400 10e3/uL   MCV 88.2 79.5 - 101.0 fL   MCH 28.0 25.1 - 34.0 pg   MCHC 31.8 31.5 - 36.0 g/dL   RBC 4.07 3.70 - 5.45 10e6/uL   RDW 18.5 (H) 11.2 - 14.5 %   lymph# 0.4 (L) 0.9 - 3.3 10e3/uL   MONO# 0.4 0.1 - 0.9 10e3/uL   Eosinophils Absolute 0.2 0.0 - 0.5 10e3/uL   Basophils Absolute 0.0 0.0 - 0.1 10e3/uL   NEUT% 26.0 (L) 38.4 - 76.8 %   LYMPH% 28.9 14.0 - 49.7 %   MONO% 30.3 (H) 0.0 - 14.0 %   EOS% 12.0 (H) 0.0 - 7.0 %   BASO% 2.8 (H) 0.0 - 2.0 %   nRBC 0 0 - 0 %   Retic % 1.83 0.70 - 2.10 %   Retic Ct Abs 74.48 33.70 - 90.70 10e3/uL   Immature Retic Fract 18.10 (H) 1.60 - 10.00 %  CBC & Diff and Retic     Status: Abnormal   Collection Time: 04/03/15  1:28 PM  Result Value Ref Range   WBC 1.3 (L) 3.9 - 10.3 10e3/uL   NEUT# 0.2 (LL) 1.5 - 6.5 10e3/uL   HGB 11.2 (L) 11.6 - 15.9 g/dL   HCT 35.7 34.8 - 46.6 %   Platelets 261 145 - 400 10e3/uL   MCV 89.3 79.5 - 101.0 fL   MCH 28.0 25.1 - 34.0 pg   MCHC 31.4 (L) 31.5 - 36.0 g/dL   RBC 4.00 3.70 - 5.45 10e6/uL   RDW 18.0 (H)  11.2 - 14.5 %   lymph# 0.4 (L) 0.9 - 3.3 10e3/uL   MONO# 0.5 0.1 - 0.9 10e3/uL   Eosinophils Absolute 0.2 0.0 - 0.5 10e3/uL   Basophils Absolute 0.1 0.0 - 0.1 10e3/uL   NEUT% 12.8 (L) 38.4 - 76.8 %   LYMPH% 32.3 14.0 - 49.7 %   MONO% 39.1 (H) 0.0 - 14.0 %   EOS% 12.0 (H) 0.0 - 7.0 %   BASO% 3.8 (H) 0.0 - 2.0 %   nRBC 0 0 - 0 %   Retic % 1.96 0.70 - 2.10 %   Retic Ct Abs 78.40 33.70 - 90.70 10e3/uL   Immature Retic Fract 13.70 (H) 1.60 - 10.00 %  CBC & Diff and Retic     Status: Abnormal   Collection Time: 04/10/15 12:28 PM  Result Value Ref Range   WBC 1.5 (L) 3.9 - 10.3 10e3/uL   NEUT# 0.3 (LL) 1.5 - 6.5 10e3/uL   HGB 11.4 (L) 11.6 - 15.9 g/dL   HCT 36.2 34.8 - 46.6 %   Platelets  252 145 - 400 10e3/uL   MCV 87.9 79.5 - 101.0 fL   MCH 27.7 25.1 - 34.0 pg   MCHC 31.5 31.5 - 36.0 g/dL   RBC 4.12 3.70 - 5.45 10e6/uL   RDW 17.3 (H) 11.2 - 14.5 %   lymph# 0.5 (L) 0.9 - 3.3 10e3/uL   MONO# 0.6 0.1 - 0.9 10e3/uL   Eosinophils Absolute 0.1 0.0 - 0.5 10e3/uL   Basophils Absolute 0.0 0.0 - 0.1 10e3/uL   NEUT% 18.9 (L) 38.4 - 76.8 %   LYMPH% 31.8 14.0 - 49.7 %   MONO% 38.5 (H) 0.0 - 14.0 %   EOS% 8.8 (H) 0.0 - 7.0 %   BASO% 2.0 0.0 - 2.0 %   nRBC 0 0 - 0 %   Retic % 1.82 0.70 - 2.10 %   Retic Ct Abs 74.98 33.70 - 90.70 10e3/uL   Immature Retic Fract 12.60 (H) 1.60 - 10.00 %  CBC & Diff and Retic     Status: Abnormal   Collection Time: 04/21/15 11:33 AM  Result Value Ref Range   WBC 1.4 (L) 3.9 - 10.3 10e3/uL   NEUT# 0.5 (LL) 1.5 - 6.5 10e3/uL   HGB 11.1 (L) 11.6 - 15.9 g/dL   HCT 35.5 34.8 - 46.6 %   Platelets 259 145 - 400 10e3/uL   MCV 88.8 79.5 - 101.0 fL   MCH 27.8 25.1 - 34.0 pg   MCHC 31.3 (L) 31.5 - 36.0 g/dL   RBC 4.00 3.70 - 5.45 10e6/uL   RDW 17.3 (H) 11.2 - 14.5 %   lymph# 0.5 (L) 0.9 - 3.3 10e3/uL   MONO# 0.3 0.1 - 0.9 10e3/uL   Eosinophils Absolute 0.1 0.0 - 0.5 10e3/uL   Basophils Absolute 0.0 0.0 - 0.1 10e3/uL   NEUT% 34.5 (L) 38.4 - 76.8 %   LYMPH% 33.1 14.0 - 49.7 %   MONO% 21.1 (H) 0.0 - 14.0 %   EOS% 8.5 (H) 0.0 - 7.0 %   BASO% 2.8 (H) 0.0 - 2.0 %   nRBC 0 0 - 0 %   Retic % 1.83 0.70 - 2.10 %   Retic Ct Abs 73.20 33.70 - 90.70 10e3/uL   Immature Retic Fract 9.80 1.60 - 30.09 %  Basic metabolic panel     Status: Abnormal   Collection Time: 04/21/15 11:33 AM  Result Value Ref Range   Sodium 142 136 - 145  mEq/L   Potassium 4.0 3.5 - 5.1 mEq/L   Chloride 106 98 - 109 mEq/L   CO2 28 22 - 29 mEq/L   Glucose 110 70 - 140 mg/dl    Comment: Glucose reference range is for nonfasting patients. Fasting glucose reference range is 70- 100.   BUN 16.7 7.0 - 26.0 mg/dL   Creatinine 0.8 0.6 - 1.1 mg/dL   Calcium 9.6 8.4 - 10.4 mg/dL   Anion Gap  8 3 - 11 mEq/L   EGFR 86 (L) >90 ml/min/1.73 m2    Comment: eGFR is calculated using the CKD-EPI Creatinine Equation (2009)  CBC & Diff and Retic     Status: Abnormal   Collection Time: 04/24/15 12:42 PM  Result Value Ref Range   WBC 1.7 (L) 3.9 - 10.3 10e3/uL   NEUT# 0.4 (LL) 1.5 - 6.5 10e3/uL   HGB 11.7 11.6 - 15.9 g/dL   HCT 36.8 34.8 - 46.6 %   Platelets 261 145 - 400 10e3/uL   MCV 88.5 79.5 - 101.0 fL   MCH 28.1 25.1 - 34.0 pg   MCHC 31.8 31.5 - 36.0 g/dL   RBC 4.16 3.70 - 5.45 10e6/uL   RDW 17.2 (H) 11.2 - 14.5 %   lymph# 0.6 (L) 0.9 - 3.3 10e3/uL   MONO# 0.5 0.1 - 0.9 10e3/uL   Eosinophils Absolute 0.1 0.0 - 0.5 10e3/uL   Basophils Absolute 0.1 0.0 - 0.1 10e3/uL   NEUT% 22.6 (L) 38.4 - 76.8 %   LYMPH% 34.1 14.0 - 49.7 %   MONO% 31.2 (H) 0.0 - 14.0 %   EOS% 8.1 (H) 0.0 - 7.0 %   BASO% 4.0 (H) 0.0 - 2.0 %   nRBC 0 0 - 0 %   Retic % 2.28 (H) 0.70 - 2.10 %   Retic Ct Abs 94.85 (H) 33.70 - 90.70 10e3/uL   Immature Retic Fract 16.90 (H) 1.60 - 10.00 %  CBC & Diff and Retic     Status: Abnormal   Collection Time: 04/30/15  1:52 PM  Result Value Ref Range   WBC 3.1 (L) 3.9 - 10.3 10e3/uL   NEUT# 1.8 1.5 - 6.5 10e3/uL   HGB 12.0 11.6 - 15.9 g/dL   HCT 37.7 34.8 - 46.6 %   Platelets 227 145 - 400 10e3/uL   MCV 87.7 79.5 - 101.0 fL   MCH 27.9 25.1 - 34.0 pg   MCHC 31.8 31.5 - 36.0 g/dL   RBC 4.30 3.70 - 5.45 10e6/uL   RDW 16.9 (H) 11.2 - 14.5 %   lymph# 0.7 (L) 0.9 - 3.3 10e3/uL   MONO# 0.3 0.1 - 0.9 10e3/uL   Eosinophils Absolute 0.2 0.0 - 0.5 10e3/uL   Basophils Absolute 0.0 0.0 - 0.1 10e3/uL   NEUT% 60.0 38.4 - 76.8 %   LYMPH% 22.6 14.0 - 49.7 %   MONO% 11.1 0.0 - 14.0 %   EOS% 5.6 0.0 - 7.0 %   BASO% 0.7 0.0 - 2.0 %   Retic % 1.35 0.70 - 2.10 %   Retic Ct Abs 58.05 33.70 - 90.70 10e3/uL   Immature Retic Fract 7.50 1.60 - 10.00 %  I-STAT, chem 8     Status: Abnormal   Collection Time: 05/02/15  6:48 AM  Result Value Ref Range   Sodium 140 135 - 145 mmol/L    Potassium 3.5 3.5 - 5.1 mmol/L   Chloride 101 101 - 111 mmol/L   BUN 16 6 - 20 mg/dL  Creatinine, Ser 0.80 0.44 - 1.00 mg/dL   Glucose, Bld 115 (H) 65 - 99 mg/dL   Calcium, Ion 1.28 1.13 - 1.30 mmol/L   TCO2 26 0 - 100 mmol/L   Hemoglobin 11.9 (L) 12.0 - 15.0 g/dL   HCT 35.0 (L) 36.0 - 46.0 %  Glucose, capillary     Status: Abnormal   Collection Time: 05/02/15  8:06 AM  Result Value Ref Range   Glucose-Capillary 118 (H) 65 - 99 mg/dL   Comment 1 Notify RN   CBC & Diff and Retic     Status: Abnormal   Collection Time: 05/02/15  9:50 AM  Result Value Ref Range   WBC 3.0 (L) 3.9 - 10.3 10e3/uL   NEUT# 2.4 1.5 - 6.5 10e3/uL   HGB 11.3 (L) 11.6 - 15.9 g/dL   HCT 36.0 34.8 - 46.6 %   Platelets 195 145 - 400 10e3/uL   MCV 88.7 79.5 - 101.0 fL   MCH 27.8 25.1 - 34.0 pg   MCHC 31.4 (L) 31.5 - 36.0 g/dL   RBC 4.06 3.70 - 5.45 10e6/uL   RDW 16.8 (H) 11.2 - 14.5 %   lymph# 0.4 (L) 0.9 - 3.3 10e3/uL   MONO# 0.2 0.1 - 0.9 10e3/uL   Eosinophils Absolute 0.1 0.0 - 0.5 10e3/uL   Basophils Absolute 0.0 0.0 - 0.1 10e3/uL   NEUT% 79.6 (H) 38.4 - 76.8 %   LYMPH% 11.7 (L) 14.0 - 49.7 %   MONO% 5.3 0.0 - 14.0 %   EOS% 2.7 0.0 - 7.0 %   BASO% 0.7 0.0 - 2.0 %   Retic % 1.15 0.70 - 2.10 %   Retic Ct Abs 46.69 33.70 - 90.70 10e3/uL   Immature Retic Fract 11.60 (H) 1.60 - 10.00 %  CBC & Diff and Retic     Status: Abnormal   Collection Time: 05/08/15  2:20 PM  Result Value Ref Range   WBC 1.0 (L) 3.9 - 10.3 10e3/uL   NEUT# 0.1 (LL) 1.5 - 6.5 10e3/uL   HGB 11.2 (L) 11.6 - 15.9 g/dL   HCT 35.4 34.8 - 46.6 %   Platelets 261 145 - 400 10e3/uL   MCV 88.1 79.5 - 101.0 fL   MCH 27.9 25.1 - 34.0 pg   MCHC 31.6 31.5 - 36.0 g/dL   RBC 4.02 3.70 - 5.45 10e6/uL   RDW 16.4 (H) 11.2 - 14.5 %   lymph# 0.5 (L) 0.9 - 3.3 10e3/uL   MONO# 0.3 0.1 - 0.9 10e3/uL   Eosinophils Absolute 0.1 0.0 - 0.5 10e3/uL   Basophils Absolute 0.1 0.0 - 0.1 10e3/uL   NEUT% 7.7 (L) 38.4 - 76.8 %   LYMPH% 46.2 14.0 - 49.7 %    MONO% 29.8 (H) 0.0 - 14.0 %   EOS% 11.5 (H) 0.0 - 7.0 %   BASO% 4.8 (H) 0.0 - 2.0 %   nRBC 0 0 - 0 %   Retic % 1.13 0.70 - 2.10 %   Retic Ct Abs 45.43 33.70 - 90.70 10e3/uL   Immature Retic Fract 6.70 1.60 - 10.00 %  CBC & Diff and Retic     Status: Abnormal   Collection Time: 05/15/15 12:11 PM  Result Value Ref Range   WBC 1.5 (L) 3.9 - 10.3 10e3/uL   NEUT# 0.5 (LL) 1.5 - 6.5 10e3/uL   HGB 11.4 (L) 11.6 - 15.9 g/dL   HCT 35.8 34.8 - 46.6 %   Platelets 243 145 - 400 10e3/uL  MCV 86.7 79.5 - 101.0 fL   MCH 27.6 25.1 - 34.0 pg   MCHC 31.8 31.5 - 36.0 g/dL   RBC 4.13 3.70 - 5.45 10e6/uL   RDW 16.0 (H) 11.2 - 14.5 %   lymph# 0.3 (L) 0.9 - 3.3 10e3/uL   MONO# 0.6 0.1 - 0.9 10e3/uL   Eosinophils Absolute 0.1 0.0 - 0.5 10e3/uL   Basophils Absolute 0.0 0.0 - 0.1 10e3/uL   NEUT% 33.8 (L) 38.4 - 76.8 %   LYMPH% 20.3 14.0 - 49.7 %   MONO% 38.5 (H) 0.0 - 14.0 %   EOS% 5.4 0.0 - 7.0 %   BASO% 2.0 0.0 - 2.0 %   nRBC 0 0 - 0 %   Retic % 1.68 0.70 - 2.10 %   Retic Ct Abs 69.38 33.70 - 90.70 10e3/uL   Immature Retic Fract 13.40 (H) 1.60 - 10.00 %    Assessment/Plan: Acute bacterial bronchitis Rx Levaquin.  Increase fluids.  Rest.  Saline nasal spray.  Probiotic.   Humidifier in bedroom. Tessalon per orders.  Call or return to clinic if symptoms are not improving.

## 2015-06-04 NOTE — Patient Instructions (Signed)
Take antibiotic (Levaquin) as directed.  Increase fluids.  Get plenty of rest. Use Use Tessalon as directed for cough. Take a daily probiotic (I recommend Align or Culturelle, but even Activia Yogurt may be beneficial).  A humidifier placed in the bedroom may offer some relief for a dry, scratchy throat of nasal irritation.  Read information below on acute bronchitis. Please call or return to clinic if symptoms are not improving.  Acute Bronchitis Bronchitis is when the airways that extend from the windpipe into the lungs get red, puffy, and painful (inflamed). Bronchitis often causes thick spit (mucus) to develop. This leads to a cough. A cough is the most common symptom of bronchitis. In acute bronchitis, the condition usually begins suddenly and goes away over time (usually in 2 weeks). Smoking, allergies, and asthma can make bronchitis worse. Repeated episodes of bronchitis may cause more lung problems.  HOME CARE  Rest.  Drink enough fluids to keep your pee (urine) clear or pale yellow (unless you need to limit fluids as told by your doctor).  Only take over-the-counter or prescription medicines as told by your doctor.  Avoid smoking and secondhand smoke. These can make bronchitis worse. If you are a smoker, think about using nicotine gum or skin patches. Quitting smoking will help your lungs heal faster.  Reduce the chance of getting bronchitis again by:  Washing your hands often.  Avoiding people with cold symptoms.  Trying not to touch your hands to your mouth, nose, or eyes.  Follow up with your doctor as told.  GET HELP IF: Your symptoms do not improve after 1 week of treatment. Symptoms include:  Cough.  Fever.  Coughing up thick spit.  Body aches.  Chest congestion.  Chills.  Shortness of breath.  Sore throat.  GET HELP RIGHT AWAY IF:   You have an increased fever.  You have chills.  You have severe shortness of breath.  You have bloody thick spit  (sputum).  You throw up (vomit) often.  You lose too much body fluid (dehydration).  You have a severe headache.  You faint.  MAKE SURE YOU:   Understand these instructions.  Will watch your condition.  Will get help right away if you are not doing well or get worse. Document Released: 01/05/2008 Document Revised: 03/21/2013 Document Reviewed: 01/09/2013 [facility] Patient Information 2015 [place], [place]. This information is not intended to replace advice given to you by your health care provider. Make sure you discuss any questions you have with your health care provider.

## 2015-06-05 ENCOUNTER — Other Ambulatory Visit: Payer: BLUE CROSS/BLUE SHIELD

## 2015-06-05 ENCOUNTER — Ambulatory Visit: Payer: BLUE CROSS/BLUE SHIELD

## 2015-06-12 ENCOUNTER — Other Ambulatory Visit: Payer: BLUE CROSS/BLUE SHIELD

## 2015-06-12 ENCOUNTER — Ambulatory Visit: Payer: BLUE CROSS/BLUE SHIELD

## 2015-06-19 ENCOUNTER — Ambulatory Visit: Payer: BLUE CROSS/BLUE SHIELD

## 2015-06-19 ENCOUNTER — Other Ambulatory Visit: Payer: BLUE CROSS/BLUE SHIELD

## 2015-06-23 ENCOUNTER — Other Ambulatory Visit: Payer: Self-pay | Admitting: Hematology and Oncology

## 2015-06-23 ENCOUNTER — Ambulatory Visit (HOSPITAL_BASED_OUTPATIENT_CLINIC_OR_DEPARTMENT_OTHER): Payer: BLUE CROSS/BLUE SHIELD | Admitting: Hematology and Oncology

## 2015-06-23 ENCOUNTER — Encounter: Payer: Self-pay | Admitting: Gynecologic Oncology

## 2015-06-23 ENCOUNTER — Ambulatory Visit: Payer: BLUE CROSS/BLUE SHIELD | Admitting: Gynecologic Oncology

## 2015-06-23 ENCOUNTER — Ambulatory Visit: Payer: BLUE CROSS/BLUE SHIELD | Attending: Gynecologic Oncology | Admitting: Gynecologic Oncology

## 2015-06-23 ENCOUNTER — Ambulatory Visit (HOSPITAL_BASED_OUTPATIENT_CLINIC_OR_DEPARTMENT_OTHER): Payer: BLUE CROSS/BLUE SHIELD

## 2015-06-23 VITALS — BP 139/65 | HR 93 | Temp 97.9°F | Resp 20 | Ht 62.0 in | Wt 224.6 lb

## 2015-06-23 VITALS — BP 131/71 | HR 80 | Temp 98.7°F | Resp 19 | Ht 62.0 in | Wt 224.6 lb

## 2015-06-23 DIAGNOSIS — Z8542 Personal history of malignant neoplasm of other parts of uterus: Secondary | ICD-10-CM

## 2015-06-23 DIAGNOSIS — C541 Malignant neoplasm of endometrium: Secondary | ICD-10-CM | POA: Diagnosis not present

## 2015-06-23 DIAGNOSIS — D709 Neutropenia, unspecified: Secondary | ICD-10-CM

## 2015-06-23 LAB — CBC & DIFF AND RETIC
BASO%: 0.5 % (ref 0.0–2.0)
Basophils Absolute: 0 10*3/uL (ref 0.0–0.1)
EOS%: 2.7 % (ref 0.0–7.0)
Eosinophils Absolute: 0.1 10*3/uL (ref 0.0–0.5)
HCT: 37.8 % (ref 34.8–46.6)
HGB: 12 g/dL (ref 11.6–15.9)
IMMATURE RETIC FRACT: 12.9 % — AB (ref 1.60–10.00)
LYMPH%: 15.8 % (ref 14.0–49.7)
MCH: 27.8 pg (ref 25.1–34.0)
MCHC: 31.7 g/dL (ref 31.5–36.0)
MCV: 87.5 fL (ref 79.5–101.0)
MONO#: 0.6 10*3/uL (ref 0.1–0.9)
MONO%: 13.8 % (ref 0.0–14.0)
NEUT%: 67.2 % (ref 38.4–76.8)
NEUTROS ABS: 2.7 10*3/uL (ref 1.5–6.5)
Platelets: 251 10*3/uL (ref 145–400)
RBC: 4.32 10*6/uL (ref 3.70–5.45)
RDW: 15.3 % — AB (ref 11.2–14.5)
RETIC %: 1.47 % (ref 0.70–2.10)
Retic Ct Abs: 63.5 10*3/uL (ref 33.70–90.70)
WBC: 4.1 10*3/uL (ref 3.9–10.3)
lymph#: 0.6 10*3/uL — ABNORMAL LOW (ref 0.9–3.3)

## 2015-06-23 MED ORDER — PREDNISONE 20 MG PO TABS: 20.0000 mg | ORAL_TABLET | Freq: Every day | ORAL | Status: DC

## 2015-06-23 NOTE — Progress Notes (Signed)
Recurrent Endometrial cancer FOLLOWUP VISIT  Assessment:    59 y.o. year old with recurrent endometrioid endometrial/ovarian cancer.   S/p exploratory laparotomy, posterior supralevator pelvic exenteration, end colostomy, bladder repair, ureteral stenting with complete resection and negative margins on 11/09/14. 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 S/p hospital admission 12/20/14 for urosepsis  S/p adjuvant radiation completed 03/10/15  Postoperative and post-radiation right hydroureter and distal ureteral obstruction.  No convincing evidence for recurrence/persistence of tumor on imaging. NED.  Plan: 1) Recurrent endometrial/ovarian cancer: s/p complete resection with negative margins and s/p adjuvant radiation. No chemotherapy due to chronic idiopathic neutropenia.   CT imaging post treatment (August, 2016) shows thickening of upper vagina and right peri-urethral tissues. Given that there was no macroscopic disease at the completion of surgery and on pre-radiation imaging, I believe this is most likely radiation changes and postop changes. Repeat CT imaging in October showed stable changes. Will repeat in January, 2017. If the "thickening" is progressive, it is more concerning for early recurrence and we would consider PET or biopsy. If stable or reducing, it is presumably radiation changes.  2) Right hydroureter and distal ureteral obstruction - secondary to postoperative scaring and post-radiation changes. Appreciate Dr Zettie Pho assistance with dilation and stenting. Will follow right hydroureter with January 2017 scan.  3) neutropenia - Dr Alvy Bimler is managing this and we appreciate.  4) Follow-up: I will see [name] for followup in February, 2017.  HPI:  [name] is a 59 y.o. 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  She has a history  of endometrial and ovarian endometrioid carcinoma treated in 2006 by Dr. [name] at [facility] in [place]. Her surgery (TAH, BSO) was followed by adjuvant chemotherapy with carboplatin plus paclitaxel due to the ovarian involvement and the presence of a cul de sac lesion also positive for disease. She denies receiving adjuvant radiation therapy. She is unclear if she had metastatic endometrial cancer to the ovary or duel primaries. She had a complete response to therapy however developed leukopenia in July 2010. 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 She sees Dr. Alvy Bimler for this and is treated with G-CSF injections.  She began experiencing rectal pain approximately in January 2016. She also reports narrowing of caliber of the stool. She denies hematochezia. She does report approximately 3 months of vaginal spotting. She's had no specific follow-up for her gynecologic cancers in the past 4 years.  As part of workup of her rectal pain she underwent a CT scan of the abdomen and pelvis on 09/12/2014. This demonstrated a new right perirectal mass abutting the vaginal cuff measuring 3.8 x 4.9 cm. There is a limited fat plane between the mass and the rectum posteriorly. Rectal invasion could not be excluded. There were no other masses identified in the abdomen and pelvis or lymphadenopathy. There is no other evidence of metastatic disease or recurrent disease. There was no hydronephrosis. A CA-125 drawn on 09/12/2014 was normal at 10.  PET was negative for extrapelvic disease. MRI defined the lesion as a 3.5x5cm lesion to the right of the rectum at the vaginal cuff.  Colonoscopy was performed on 10/02/14 with transrectal [place] and biopsy and this revealed endometrioid adenocarcinoma. Of note, the lesion was not seen within the lumen of the rectum.   She then  underwent a  posterior supralevator exenteration with colostomy and bladder repair and stent placement on 0/25/42 without complications.  Her postoperative course was uncomplicate with the exception of development of postop anemia.  Her final pathology revealed endometrioid adenocarcinoma invading the vagina and rectum with negative margins on the specimen. The margin had been close (clinically) to the right pelvic sidewall which was marked with surgical clips.  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 It was treated with IV antibiotics and then on POD 15 she was taken to the OR for laparoscopic revision of the stoma with Dr Michael [name]. Postoperatively she had wound vac and packing for her stomal wound.  A retrograde cystogram on week 4 postop confirmed an intact bladder and the foley was removed.  She initially had some voiding issues with decreased sensation to void. We tested a post void residual in May, 2016 and this revealed adequate voiding.  On 12/30/14 she was admitted to [facility] with sepsis associated with Enterobacter Cloacae UTI. This was treated with IV antibiotics and then a prolonged course of oral cipro. Imaging performed at the time of admission (a CT of the abdo/pelvis) revealed: Bilateral double-J internal ureteral stents in adequate position with persistent mild bilateral hydronephrosis   She complete radiation therapy from 01/29/15 to 03/10/15 with 50Gy of external beam radiation and IMRT. She tolerated therapy well with minor skin irritation.  She had her ureteral stents removed in June, 2016, however, then developed right ureteral obstruction, hydroureter and pain. She went to the OR with Dr Tresa Moore on 03/20/15 for ureteral dilation and right stent placement (the left was draining well). No tumor was seen on cysto.    Interval Hx:   Right ureteral stent now out. No vaginal bleeding. Stoma working well. Gaining weight.  Mood improved.   CT abdo/pelvis on 05/14/15 showed: no new lesions, stable thickening in right pelvis/distal right ureter consistent with radiation effect.  She has had bronchitis symptoms for >29month s/p levaquin trial.  Review of systems: Constitutional:  She has no weight gain or weight loss. She has a low grade fever no chills. Eyes: No blurred vision Ears, Nose, Mouth, Throat: No dizziness, headaches or changes in hearing. No mouth sores. Cardiovascular: No chest pain, palpitations or edema. Respiratory:  No shortness of breath, wheezing +cough Gastrointestinal: She has normal bowel movements trhough stoma without diarrhea or constipation. She denies any nausea or vomiting. She denies blood in her stool or heart burn. Genitourinary:  See HPI Musculoskeletal: Denies muscle weakness or joint pains.  Skin:  She has no skin changes, rashes or itching Neurological:  Denies dizziness or headaches. No neuropathy, no numbness or tingling. Psychiatric:  She denies depression or anxiety. Hematologic/Lymphatic:   No easy bruising or bleeding  Allergies  Allergen Reactions  . Penicillins Swelling    Facial swelling  . Ultram [Tramadol] Hives  . Adhesive [Tape]     blisters  . Cefaclor Rash    Ceclor  . Erythromycin     Gastritis, abd cramps  . Trimethoprim Rash  . Pectin Rash    Pectin ring for stoma  . Sulfa Antibiotics Rash    Current Outpatient Prescriptions on File Prior to Visit  Medication Sig Dispense Refill  . Biotin 5 MG TABS Take 1 tablet by mouth daily.     . Canagliflozin (INVOKANA) 300 MG TABS Take 1 tablet by mouth every morning.     .[name] Kitchen  Cholecalciferol (VITAMIN D3) 10000 UNITS capsule Take 10,000 Units by mouth once a week. Sundays    . diphenhydrAMINE (BENADRYL) 25 MG tablet Take 25 mg by mouth every 4 (four) hours as needed.    . filgrastim (NEUPOGEN) 480 MCG/1.6ML injection Inject 1.6 mLs (480 mcg total) into the skin once a week. 1.6 mL 12  . fluticasone  (FLOVENT HFA) 110 MCG/ACT inhaler Inhale 1 puff into the lungs as needed (SOB and Wheezing).     [name] Kitchen guaiFENesin (MUCINEX) 600 MG 12 hr tablet Take 600 mg by mouth 2 (two) times daily.    . hyaluronate sodium (RADIAPLEXRX) GEL Apply 1 application topically 2 (two) times daily as needed.     [name] Kitchen levothyroxine (SYNTHROID) 175 MCG tablet Take 175 mcg by mouth daily before breakfast.    . loperamide (IMODIUM) 2 MG capsule Take by mouth as needed for diarrhea or loose stools.    [name] Kitchen loratadine (CLARITIN) 10 MG tablet Take 10 mg by mouth daily.    . metFORMIN (GLUCOPHAGE) 1000 MG tablet Take 1,000 mg by mouth 2 (two) times daily with a meal.     . Multiple Vitamin (MULTIVITAMIN WITH MINERALS) TABS tablet Take 1 tablet by mouth daily.    [name] Kitchen omega-3 acid ethyl esters (LOVAZA) 1 G capsule Take 1 g by mouth 2 (two) times daily.    [name] Kitchen omeprazole (PRILOSEC) 20 MG capsule Take 20 mg by mouth at bedtime.     Vladimir Faster Glycol-Propyl Glycol (SYSTANE OP) Apply 1 drop to eye daily as needed (Dry eyes).     . rosuvastatin (CRESTOR) 10 MG tablet Take 10 mg by mouth every evening.     . sitaGLIPtin (JANUVIA) 100 MG tablet Take 100 mg by mouth daily.    . valsartan (DIOVAN) 320 MG tablet Take 320 mg by mouth every morning.     . Alum & Mag Hydroxide-Simeth (MAGIC MOUTHWASH) SOLN Take 5 mLs by mouth daily as needed for mouth pain (mouth pain).    . benzonatate (TESSALON) 100 MG capsule Take 1 capsule (100 mg total) by mouth 2 (two) times daily as needed for cough. (Patient not taking: Reported on 06/23/2015) 20 capsule 0  . Calcium Carbonate-Vitamin D (CALCIUM + D PO) Take 2 tablets by mouth daily.    . diphenoxylate-atropine (LOMOTIL) 2.5-0.025 MG per tablet Take 1 tablet by mouth 4 (four) times daily as needed for diarrhea or loose stools. (Patient not taking: Reported on 06/04/2015) 60 tablet 0  . ibuprofen (ADVIL,MOTRIN) 800 MG tablet Take 800 mg by mouth every 8 (eight) hours as needed for moderate pain (pain).    .  naproxen sodium (ANAPROX) 220 MG tablet Take 220 mg by mouth daily as needed.     . ondansetron (ZOFRAN) 4 MG tablet Take 1 tablet (4 mg total) by mouth every 6 (six) hours as needed for nausea. (Patient not taking: Reported on 06/23/2015) 20 tablet 0  . oxyCODONE (OXY IR/ROXICODONE) 5 MG immediate release tablet Take 1 tablet (5 mg total) by mouth every 4 (four) hours as needed for moderate pain or severe pain. Post-operatively (Patient not taking: Reported on 06/23/2015) 15 tablet 0   No current facility-administered medications on file prior to visit.    Past Medical History  Diagnosis Date  . Benign essential HTN 12/14/2011  . Multiple thyroid nodules     Managed by Dr. Harlow Asa  . GERD (gastroesophageal reflux disease)   . Depression   . Dysuria   . Radiation-induced dermatitis  contact dermatitis , radiation completed, rash only on ankles now.  . History of gastric polyp     2014  duodenum  . History of radiation therapy     01-29-2015 to 03-10-2015  pelvis 50.4Gy  . Hypothyroidism   . DM type 2 (diabetes mellitus, type 2) ([place])   . Hiatal hernia   . Anemia in neoplastic disease   . Chronic idiopathic neutropenia (HCC)     presumed related to chemotherapy March 2006--- followed by dr Alvy Bimler   . Cancer of corpus uteri, except isthmus [facility]) oncologist-- dr Denman [name] and dr Alvy Bimler     Mar 2006 dx endometrial and ovarian cancer s/p  chemotheapy and surgery  . Ureteral stricture, right   . S/P colostomy [facility])     Past Surgical History  Procedure Laterality Date  . Appendectomy    . Tonsillectomy    . Eye surgery      pytosis of eyelids-child  . Colonoscopy with propofol N/A 08/21/2013    Procedure: COLONOSCOPY WITH PROPOFOL;  Surgeon: Cleotis Nipper, MD;  Location: [facility];  Service: Endoscopy;  Laterality: N/A;  . Eus N/A 10/02/2014    Procedure: LOWER ENDOSCOPIC ULTRASOUND (EUS);  Surgeon: Arta Silence, MD;  Location: [facility];  Service: Endoscopy;  Laterality:  N/A;  . Robotic assisted lap vaginal hysterectomy N/A 11/19/2014    Procedure: ROBOTIC LYSIS OF ADHESIONS, CONVERTED TO LAPAROTOMY RADICAL UPPER VAGINECTOMY,LOW ANTERIOR BOWEL RESECTION, COLOSTOMY, BILATERAL URETERAL STENT PLACEMENT AND CYSTONOMY CLOSURE;  Surgeon: Everitt Amber, MD;  Location: [facility];  Service: Gynecology;  Laterality: N/A;  . Ostomy N/A 11/19/2014    Procedure: OSTOMY;  Surgeon: Michael [name], MD;  Location: [facility];  Service: General;  Laterality: N/A;  . Colostomy takedown N/A 12/04/2014    Procedure: LAPROSCOPIC LYSIS OF ADHESIONS, SPLENIC MOBILIZATION, RELOCATION OF COLOSTOMY, DEBRIDEMENT INITIAL COLOSTOMY SITE;  Surgeon: Michael [name], MD;  Location: [facility];  Service: General;  Laterality: N/A;  . Cystoscopy with retrograde pyelogram, ureteroscopy and stent placement Right 03/20/2015    Procedure: CYSTOSCOPY WITH RETROGRADE PYELOGRAM, URETEROSCOPY WITH BALLOON DILATION AND STENT PLACEMENT ON RIGHT;  Surgeon: Alexis Frock, MD;  Location: [facility];  Service: Urology;  Laterality: Right;  . Excision soft tissue mass right foreman  12-08-2006  . Abdominal hysterectomy  2006    staging for Ovarian cancer  . Laparoscopic cholecystectomy    . Cystoscopy with retrograde pyelogram, ureteroscopy and stent placement Right 05/02/2015    Procedure: CYSTOSCOPY WITH RIGHT RETROGRADE PYELOGRAM,  DIAGNOSTIC URETEROSCOPY AND STENT PULL ;  Surgeon: Alexis Frock, MD;  Location: [facility];  Service: Urology;  Laterality: Right;    Family History  Problem Relation Age of Onset  . Cancer Mother     stomach ca  . Hypertension Mother   . Cancer Father     prostate ca  . Diabetes Father   . Diabetes Sister   . Hypertension Brother y-10    Social History   Social History  . Marital Status: Married    Spouse Name: N/A  . Number of Children: 1  . Years of Education: N/A   Occupational History  . retired Therapist, sports from [place] History Main Topics  .  Smoking status: Never Smoker   . Smokeless tobacco: Never Used  . Alcohol Use: Yes     Comment: rare social  . Drug Use: No  . Sexual Activity: Not Currently   Other Topics Concern  . Not on file  Social History Narrative   CBC    Component Value Date/Time   WBC 1.5* 05/15/2015 1211   WBC 3.3* 01/02/2015 0400   WBC 2.4* 08/25/2014 1541   RBC 4.13 05/15/2015 1211   RBC 3.18* 01/02/2015 0400   RBC 3.68* 12/13/2014 1235   RBC 4.75 08/25/2014 1541   HGB 11.4* 05/15/2015 1211   HGB 11.9* 05/02/2015 0648   HGB 13.0 08/25/2014 1541   HCT 35.8 05/15/2015 1211   HCT 35.0* 05/02/2015 0648   HCT 41.3 08/25/2014 1541   PLT 243 05/15/2015 1211   PLT 318 01/02/2015 0400   MCV 86.7 05/15/2015 1211   MCV 87.4 01/02/2015 0400   MCV 86.8 08/25/2014 1541   MCH 27.6 05/15/2015 1211   MCH 26.1 01/02/2015 0400   MCH 27.5 08/25/2014 1541   MCHC 31.8 05/15/2015 1211   MCHC 29.9* 01/02/2015 0400   MCHC 31.6* 08/25/2014 1541   RDW 16.0* 05/15/2015 1211   RDW 15.9* 01/02/2015 0400   LYMPHSABS 0.3* 05/15/2015 1211   LYMPHSABS 1.1 12/31/2014 0638   MONOABS 0.6 05/15/2015 1211   MONOABS 1.4* 12/31/2014 0638   EOSABS 0.1 05/15/2015 1211   EOSABS 0.0 12/31/2014 0638   BASOSABS 0.0 05/15/2015 1211   BASOSABS 0.0 12/31/2014 5615     Physical Exam: Blood pressure 131/71, pulse 80, temperature 98.7 F (37.1 C), temperature source Oral, resp. rate 19, height 5' 2"  (1.575 m), weight 224 lb 9.6 oz (101.878 kg), SpO2 100 %. General: Well dressed, well nourished in no apparent distress.   HEENT:  Normocephalic and atraumatic, no lesions.  Extraocular muscles intact. Sclerae anicteric. Pupils equal, round, reactive. No mouth sores or ulcers. Thyroid is normal size, not nodular, midline. Skin:  No lesions or rashes. Breasts:  deferred Lungs:  Clear to auscultation bilaterally.  No wheezes. Cardiovascular:  Regular rate and rhythm.  No murmurs or rubs. Abdomen:  Soft, nontender, nondistended.  No  palpable masses.  No hepatosplenomegaly.  No ascites. Normal bowel sounds.  No hernias.  Incision is healed. Laparoscopic incision healed. Stomal site healed. Stomal appliance in situ. Genitourinary: vaginal cuff intact without blood or fluid in the vault. Some synechaie and shortening of upper vagina. No thickening or masses to suggest recurrence. Rectal exam deferred Extremities: No cyanosis, clubbing or edema.  No calf tenderness or erythema. No palpable cords. Psychiatric: Mood and affect are appropriate. Neurological: Awake, alert and oriented x 3. Sensation is intact, no neuropathy.  Musculoskeletal: No pain, normal strength and range of motion.  Donaciano Eva, MD

## 2015-06-23 NOTE — Patient Instructions (Addendum)
Plan for a CT scan of the abdomen and pelvis in Jan 2017 with a follow up appointment to see Dr. Denman [name] in Feb 2017.  Only liquids 4 hours before your scan.  You will also be advised about your metformin as well after your CT scan.  You do not have to drink oral contrast before your scan.

## 2015-06-24 ENCOUNTER — Telehealth: Payer: Self-pay | Admitting: Hematology and Oncology

## 2015-06-24 ENCOUNTER — Encounter: Payer: Self-pay | Admitting: Hematology and Oncology

## 2015-06-24 NOTE — Progress Notes (Signed)
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 Going to the patient, initial diagnosis with ovarian cancer was when she presented with abdomen and no swelling and pelvic pain leading to her urgent care visit. She states that the tumor had caused torsion of the ovary. She cannot remember the stage of her disease. She began to develop leukopenia in approximately July of 2010. She underwent an initial bone marrow biopsy at [facility] in [place] on 03/17/2009. but it was nondiagnostic. No gross dysplastic changes, no excess blasts, normal cytogenetics.  Her counts have been stable over observation through this office since December 2010 with some minor fluctuations. Total white counts run as low as 1800 and as high as 2900. Percent neutrophils fluctuates very widely from as low as 4% recorded on a CBC done 02/03/2010 to as high as 31% with average being 20%. She was hospitalized in the ICU at that time  Both hemoglobin and platelets have remained stable over time.  Despite leukopenia, she has not had any recurrent or severe infections. She stated strong family history of lupus in a cousin. She complained of occasional skin rash and joint stiffness. 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 She received G-CSF in January of 2015 to stimulate white blood cell production around a colonoscopy procedure a few years ago. She had a limited response. On 01/23/2014, repeat bone marrow biopsy excluded lymphoma or leukemia. On 09/12/2014, CT scan of the abdomen and pelvis show possible relapse of gynecological Malignancy On 10/14/2014, MRI of the pelvis show significant pelvic mass without invasion into the bladder but abutting to the rectum On 11/19/2014, she underwent surgery and had robotic-assisted lysis of adhesions, converted to laparotomy, radical upper vaginectomy and low anterior resection with colostomy. Bilateral ureteral stent placement and cystotomy repair. 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 Resection margins were negative. She was hospitalized from 11/19/2014 to 11/25/2014. In May, showed recurrent admission to the hospital due to complication with infection. On 01/29/2015, she began adjuvant radiation treatment On 03/03/2015, CT scan of the abdomen and pelvis showed status post interval removal of the bilateral nephroureteral stents. Worsening moderate right hydroureteronephrosis. Resolved left hydroureteronephrosis. On 03/20/2015, she had cystoscopy and stent placement for right hydronephrosis On 05/14/2015, CT scan of the abdomen and pelvis show unilateral right hydronephrosis with no residual cancer   INTERVAL HISTORY: Please see below for problem oriented charting. She is seen urgently today because of the risk concerns. Approximately one month ago, she has significant nasal congestion and cough. She denies fevers or chills. She was prescribed 10 day course of oral levofloxacin without improvement and she continues to have significant nasal drip and coughing. She denies other signs of infection such as diarrhea, nausea or vomiting Her son-in-law was  recently exposed to MRSA on the skin on his face and was prescribed doxycycline. She is  concerned about Thanksgiving holidays. Since the last time I saw her, she was able to self administer Neupogen at home.  REVIEW OF SYSTEMS:   Constitutional: Denies fevers, chills or abnormal weight loss Eyes: Denies blurriness of vision Ears, nose, mouth, throat, and face: Denies mucositis or sore throat Cardiovascular: Denies palpitation, chest discomfort or lower extremity swelling Gastrointestinal:  Denies nausea, heartburn or change in bowel habits Skin: Denies abnormal skin rashes Lymphatics: Denies new lymphadenopathy or easy bruising Neurological:Denies numbness, tingling or new weaknesses Behavioral/Psych: Mood is stable, no new changes  All other systems were reviewed with the patient and are negative.  I have reviewed the past medical history, past surgical history, social history and family history with the patient and they are unchanged from previous note.  ALLERGIES:  is allergic to penicillins; ultram; adhesive; cefaclor; erythromycin; trimethoprim; pectin; and sulfa antibiotics.  MEDICATIONS:  Current Outpatient Prescriptions  Medication Sig Dispense Refill  . Alum & Mag Hydroxide-Simeth (MAGIC MOUTHWASH) SOLN Take 5 mLs by mouth daily as needed for mouth pain (mouth pain).    . benzonatate (TESSALON) 100 MG capsule Take 1 capsule (100 mg total) by mouth 2 (two) times daily as needed for cough. 20 capsule 0  . Biotin 5 MG TABS Take 1 tablet by mouth daily.     . Calcium Carbonate-Vitamin D (CALCIUM + D PO) Take 2 tablets by mouth daily.    . Canagliflozin (INVOKANA) 300 MG TABS Take 1 tablet by mouth every morning.     . Cholecalciferol (VITAMIN D3) 10000 UNITS capsule Take 10,000 Units by mouth once a week. Sundays    . diphenhydrAMINE (BENADRYL) 25 MG tablet Take 25 mg by mouth every 4 (four) hours as needed.    . diphenoxylate-atropine (LOMOTIL) 2.5-0.025 MG per tablet Take 1  tablet by mouth 4 (four) times daily as needed for diarrhea or loose stools. 60 tablet 0  . filgrastim (NEUPOGEN) 480 MCG/1.6ML injection Inject 1.6 mLs (480 mcg total) into the skin once a week. 1.6 mL 12  . fluticasone (FLOVENT HFA) 110 MCG/ACT inhaler Inhale 1 puff into the lungs as needed (SOB and Wheezing).     [name] Kitchen guaiFENesin (MUCINEX) 600 MG 12 hr tablet Take 600 mg by mouth 2 (two) times daily.    . hyaluronate sodium (RADIAPLEXRX) GEL Apply 1 application topically 2 (two) times daily as needed.     [name] Kitchen ibuprofen (ADVIL,MOTRIN) 800 MG tablet Take 800 mg by mouth every 8 (eight) hours as needed for moderate pain (pain).    [name] Kitchen levothyroxine (SYNTHROID) 175 MCG tablet Take 175 mcg by mouth daily before breakfast.    . loperamide (IMODIUM) 2 MG capsule Take by mouth as needed for diarrhea or loose stools.    [name] Kitchen loratadine (CLARITIN) 10 MG tablet Take 10 mg by mouth daily.    . metFORMIN (GLUCOPHAGE) 1000 MG tablet Take 1,000 mg by mouth 2 (two) times daily with a meal.     . Multiple Vitamin (MULTIVITAMIN WITH MINERALS) TABS tablet Take 1 tablet by mouth daily.    . naproxen sodium (ANAPROX) 220 MG tablet Take 220 mg by mouth daily as needed.     [name] Kitchen omega-3 acid ethyl esters (LOVAZA) 1 G capsule Take 1 g by mouth 2 (two) times daily.    [name] Kitchen omeprazole (PRILOSEC) 20 MG capsule Take 20 mg by mouth at bedtime.     . ondansetron (ZOFRAN) 4 MG tablet Take 1 tablet (4 mg total) by mouth every 6 (six) hours as needed  for nausea. 20 tablet 0  . ONETOUCH VERIO test strip     . oxyCODONE (OXY IR/ROXICODONE) 5 MG immediate release tablet Take 1 tablet (5 mg total) by mouth every 4 (four) hours as needed for moderate pain or severe pain. Post-operatively 15 tablet 0  . Polyethyl Glycol-Propyl Glycol (SYSTANE OP) Apply 1 drop to eye daily as needed (Dry eyes).     . rosuvastatin (CRESTOR) 10 MG tablet Take 10 mg by mouth every evening.     . sitaGLIPtin (JANUVIA) 100 MG tablet Take 100 mg by mouth daily.    .  valsartan (DIOVAN) 320 MG tablet Take 320 mg by mouth every morning.     . predniSONE (DELTASONE) 20 MG tablet Take 1 tablet (20 mg total) by mouth daily with breakfast. 10 tablet 0   No current facility-administered medications for this visit.    PHYSICAL EXAMINATION: ECOG PERFORMANCE STATUS: 1 - Symptomatic but completely ambulatory  Filed Vitals:   06/23/15 1334  BP: 139/65  Pulse: 93  Temp: 97.9 F (36.6 C)  Resp: 20   Filed Weights   06/23/15 1334  Weight: 224 lb 9.6 oz (101.878 kg)    GENERAL:alert, no distress and comfortable SKIN: skin color, texture, turgor are normal, no rashes or significant lesions EYES: normal, Conjunctiva are pink and non-injected, sclera clear OROPHARYNX:no exudate, no erythema and lips, buccal mucosa, and tongue normal  NECK: supple, thyroid normal size, non-tender, without nodularity LYMPH:  no palpable lymphadenopathy in the cervical, axillary or inguinal LUNGS: clear to auscultation and percussion with normal breathing effort HEART: regular rate & rhythm and no murmurs and no lower extremity edema ABDOMEN:abdomen soft, non-tender and normal bowel sounds Musculoskeletal:no cyanosis of digits and no clubbing  NEURO: alert & oriented x 3 with fluent speech, no focal motor/sensory deficits  LABORATORY DATA:  I have reviewed the data as listed    Component Value Date/Time   NA 140 05/02/2015 0648   NA 142 04/21/2015 1133   K 3.5 05/02/2015 0648   K 4.0 04/21/2015 1133   CL 101 05/02/2015 0648   CO2 28 04/21/2015 1133   CO2 24 01/06/2015 1610   GLUCOSE 115* 05/02/2015 0648   GLUCOSE 110 04/21/2015 1133   BUN 16 05/02/2015 0648   BUN 16.7 04/21/2015 1133   CREATININE 0.80 05/02/2015 0648   CREATININE 0.8 04/21/2015 1133   CREATININE 0.80 01/06/2015 1610   CALCIUM 9.6 04/21/2015 1133   CALCIUM 9.6 01/06/2015 1610   PROT 6.7 12/31/2014 0638   PROT 6.4 11/29/2014 1251   ALBUMIN 2.8* 12/31/2014 0638   ALBUMIN 2.6* 11/29/2014 1251    AST 11* 12/31/2014 0638   AST 15 11/29/2014 1251   ALT 12* 12/31/2014 0638   ALT 17 11/29/2014 1251   ALKPHOS 57 12/31/2014 0638   ALKPHOS 77 11/29/2014 1251   BILITOT 0.6 12/31/2014 0638   BILITOT 0.34 11/29/2014 1251   GFRNONAA >60 01/02/2015 0400   GFRNONAA 78 12/16/2014 1530   GFRAA >60 01/02/2015 0400   GFRAA >89 12/16/2014 1530    No results found for: SPEP, UPEP  Lab Results  Component Value Date   WBC 4.1 06/23/2015   NEUTROABS 2.7 06/23/2015   HGB 12.0 06/23/2015   HCT 37.8 06/23/2015   MCV 87.5 06/23/2015   PLT 251 06/23/2015      Chemistry      Component Value Date/Time   NA 140 05/02/2015 0648   NA 142 04/21/2015 1133   K 3.5 05/02/2015 4944  K 4.0 04/21/2015 1133   CL 101 05/02/2015 0648   CO2 28 04/21/2015 1133   CO2 24 01/06/2015 1610   BUN 16 05/02/2015 0648   BUN 16.7 04/21/2015 1133   CREATININE 0.80 05/02/2015 0648   CREATININE 0.8 04/21/2015 1133   CREATININE 0.80 01/06/2015 1610      Component Value Date/Time   CALCIUM 9.6 04/21/2015 1133   CALCIUM 9.6 01/06/2015 1610   ALKPHOS 57 12/31/2014 0638   ALKPHOS 77 11/29/2014 1251   AST 11* 12/31/2014 0638   AST 15 11/29/2014 1251   ALT 12* 12/31/2014 0638   ALT 17 11/29/2014 1251   BILITOT 0.6 12/31/2014 0638   BILITOT 0.34 11/29/2014 1251     ASSESSMENT & PLAN:  Chronic neutropenia She is concerned about non-resolving nasal drainage, cough and congestion despite completing a course of oral antibiotics. Her blood count does not suggest active infection. Certainly allergic rhinitis with ongoing nasal drainage could cause chronic cough. I recommend a trial of nasal decongestion and nasal spray. I gave her prescription of short course prednisone to take if the allergic component does not resolve with conservative management. She is also concerned about her son-in-law who has been exposed to MRSA recently but has been adequately treated. Technically, even though the patient is  immunocompromised, showed no signs of neutropenia since she started self administration of weekly Neupogen. As long as she practice good hand hygiene, I do not see contraindication for her son-in-law to visit her during Thanksgiving holidays.   No orders of the defined types were placed in this encounter.   All questions were answered. The patient knows to call the clinic with any problems, questions or concerns. No barriers to learning was detected. I spent 15 minutes counseling the patient face to face. The total time spent in the appointment was 20 minutes and more than 50% was on counseling and review of test results     [facility], [place], MD 06/24/2015 7:22 AM

## 2015-06-24 NOTE — Assessment & Plan Note (Signed)
She is concerned about non-resolving nasal drainage, cough and congestion despite completing a course of oral antibiotics. Her blood count does not suggest active infection. Certainly allergic rhinitis with ongoing nasal drainage could cause chronic cough. I recommend a trial of nasal decongestion and nasal spray. I gave her prescription of short course prednisone to take if the allergic component does not resolve with conservative management. She is also concerned about her son-in-law who has been exposed to MRSA recently but has been adequately treated. Technically, even though the patient is immunocompromised, showed no signs of neutropenia since she started self administration of weekly Neupogen. As long as she practice good hand hygiene, I do not see contraindication for her son-in-law to visit her during Thanksgiving holidays.

## 2015-06-24 NOTE — Telephone Encounter (Signed)
s.w. pt and advised on Jan 2017 appt.[name] KitchenMarland KitchenMarland KitchenMarland Kitchenpt ok and aware of d.t

## 2015-06-25 ENCOUNTER — Ambulatory Visit: Payer: BLUE CROSS/BLUE SHIELD

## 2015-06-25 ENCOUNTER — Other Ambulatory Visit: Payer: BLUE CROSS/BLUE SHIELD

## 2015-07-03 ENCOUNTER — Other Ambulatory Visit: Payer: BLUE CROSS/BLUE SHIELD

## 2015-07-03 ENCOUNTER — Ambulatory Visit: Payer: BLUE CROSS/BLUE SHIELD

## 2015-07-10 ENCOUNTER — Ambulatory Visit: Payer: BLUE CROSS/BLUE SHIELD

## 2015-07-10 ENCOUNTER — Other Ambulatory Visit: Payer: BLUE CROSS/BLUE SHIELD

## 2015-07-17 ENCOUNTER — Ambulatory Visit: Payer: BLUE CROSS/BLUE SHIELD

## 2015-07-17 ENCOUNTER — Ambulatory Visit (HOSPITAL_COMMUNITY): Payer: BLUE CROSS/BLUE SHIELD

## 2015-07-17 ENCOUNTER — Other Ambulatory Visit: Payer: BLUE CROSS/BLUE SHIELD

## 2015-07-23 ENCOUNTER — Other Ambulatory Visit: Payer: Self-pay | Admitting: Hematology and Oncology

## 2015-07-24 ENCOUNTER — Other Ambulatory Visit: Payer: BLUE CROSS/BLUE SHIELD

## 2015-07-24 ENCOUNTER — Ambulatory Visit: Payer: BLUE CROSS/BLUE SHIELD

## 2015-07-31 ENCOUNTER — Ambulatory Visit: Payer: BLUE CROSS/BLUE SHIELD | Admitting: Hematology and Oncology

## 2015-07-31 ENCOUNTER — Ambulatory Visit: Payer: BLUE CROSS/BLUE SHIELD

## 2015-07-31 ENCOUNTER — Other Ambulatory Visit: Payer: BLUE CROSS/BLUE SHIELD

## 2015-08-03 DIAGNOSIS — C50912 Malignant neoplasm of unspecified site of left female breast: Secondary | ICD-10-CM

## 2015-08-03 HISTORY — DX: Malignant neoplasm of unspecified site of left female breast: C50.912

## 2015-08-07 ENCOUNTER — Other Ambulatory Visit: Payer: BLUE CROSS/BLUE SHIELD

## 2015-08-07 ENCOUNTER — Ambulatory Visit: Payer: BLUE CROSS/BLUE SHIELD

## 2015-08-12 ENCOUNTER — Telehealth: Payer: Self-pay | Admitting: Hematology and Oncology

## 2015-08-12 ENCOUNTER — Other Ambulatory Visit: Payer: Self-pay | Admitting: Hematology and Oncology

## 2015-08-12 DIAGNOSIS — C549 Malignant neoplasm of corpus uteri, unspecified: Secondary | ICD-10-CM

## 2015-08-12 NOTE — Telephone Encounter (Signed)
Talked to patient here in office. Scheduled appt.       [facility]. °

## 2015-08-14 ENCOUNTER — Ambulatory Visit: Payer: BLUE CROSS/BLUE SHIELD

## 2015-08-14 ENCOUNTER — Other Ambulatory Visit: Payer: BLUE CROSS/BLUE SHIELD

## 2015-08-16 IMAGING — CR DG CHEST 2V
2 series · 2 of 2 positions shown · non-contrast
Comparison: 04/19/2010

CLINICAL DATA: Pt c/o cough and congestion x 5 days; Hx of Port a
Cath placement; non smoker

EXAM:
CHEST  2 VIEW

[PA]
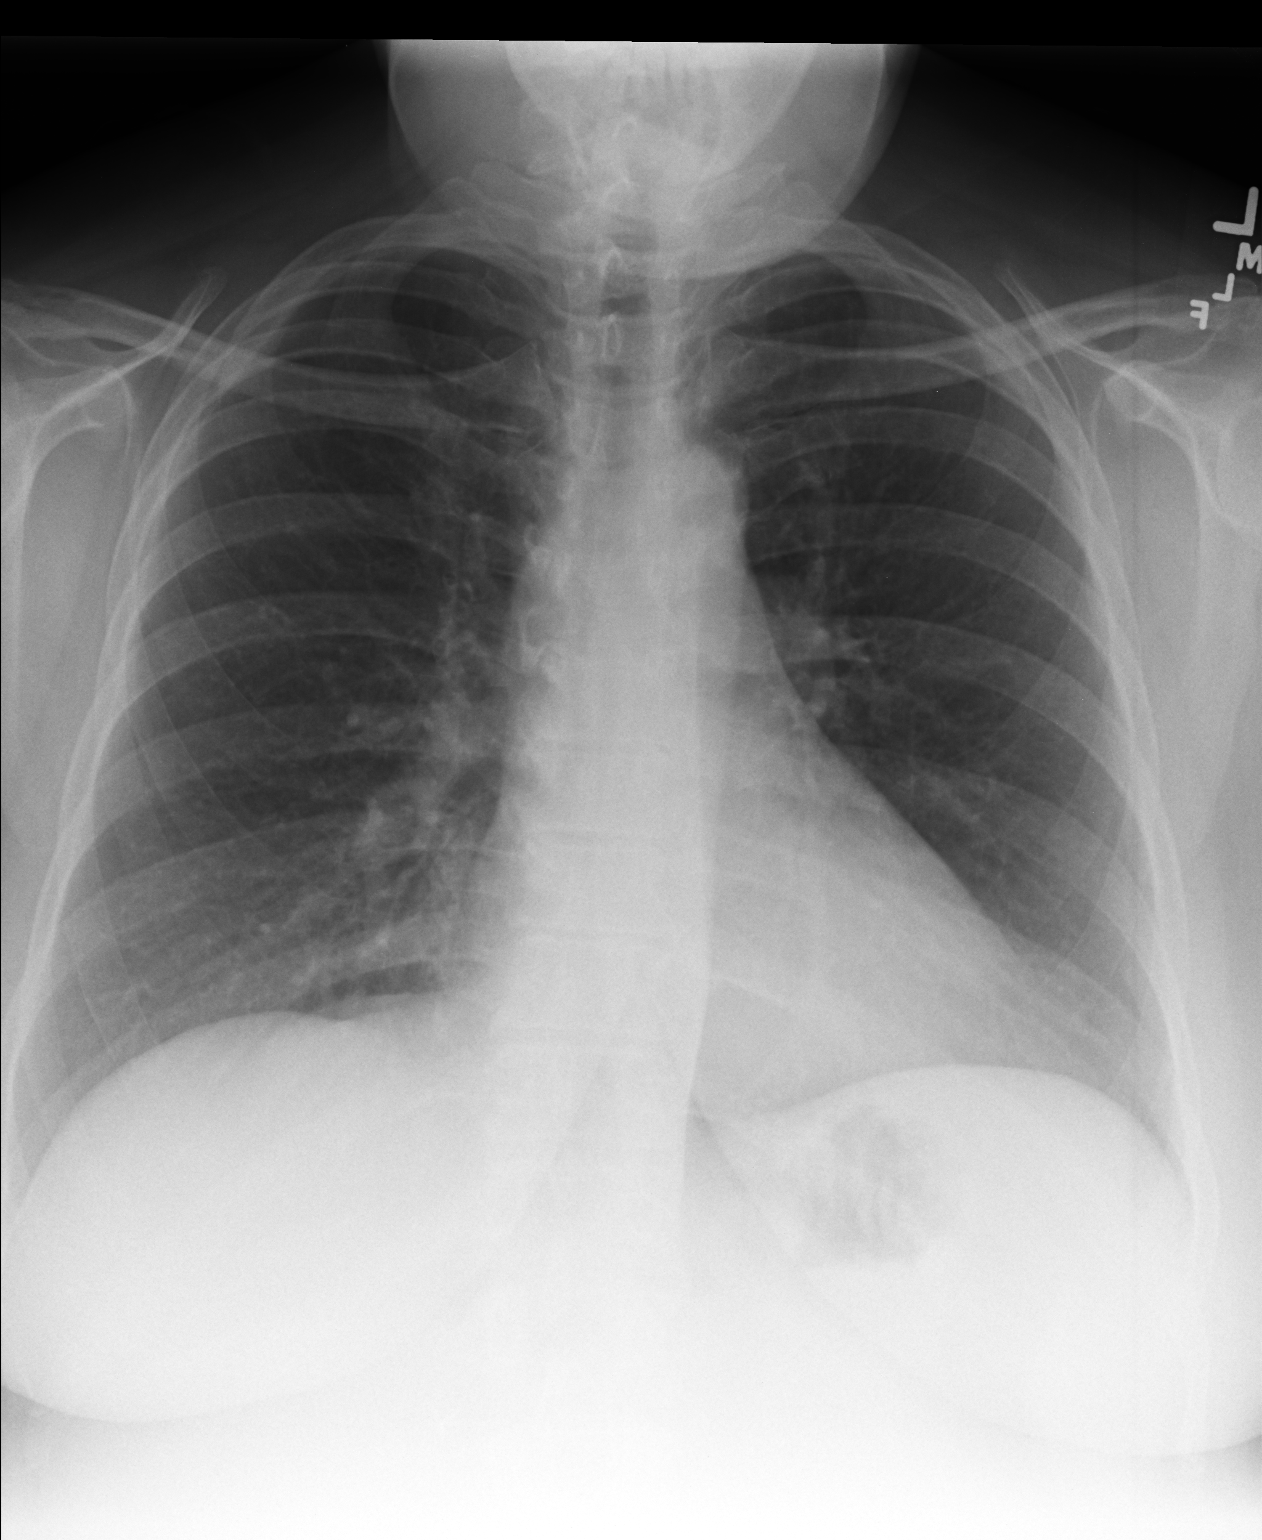

[lateral]
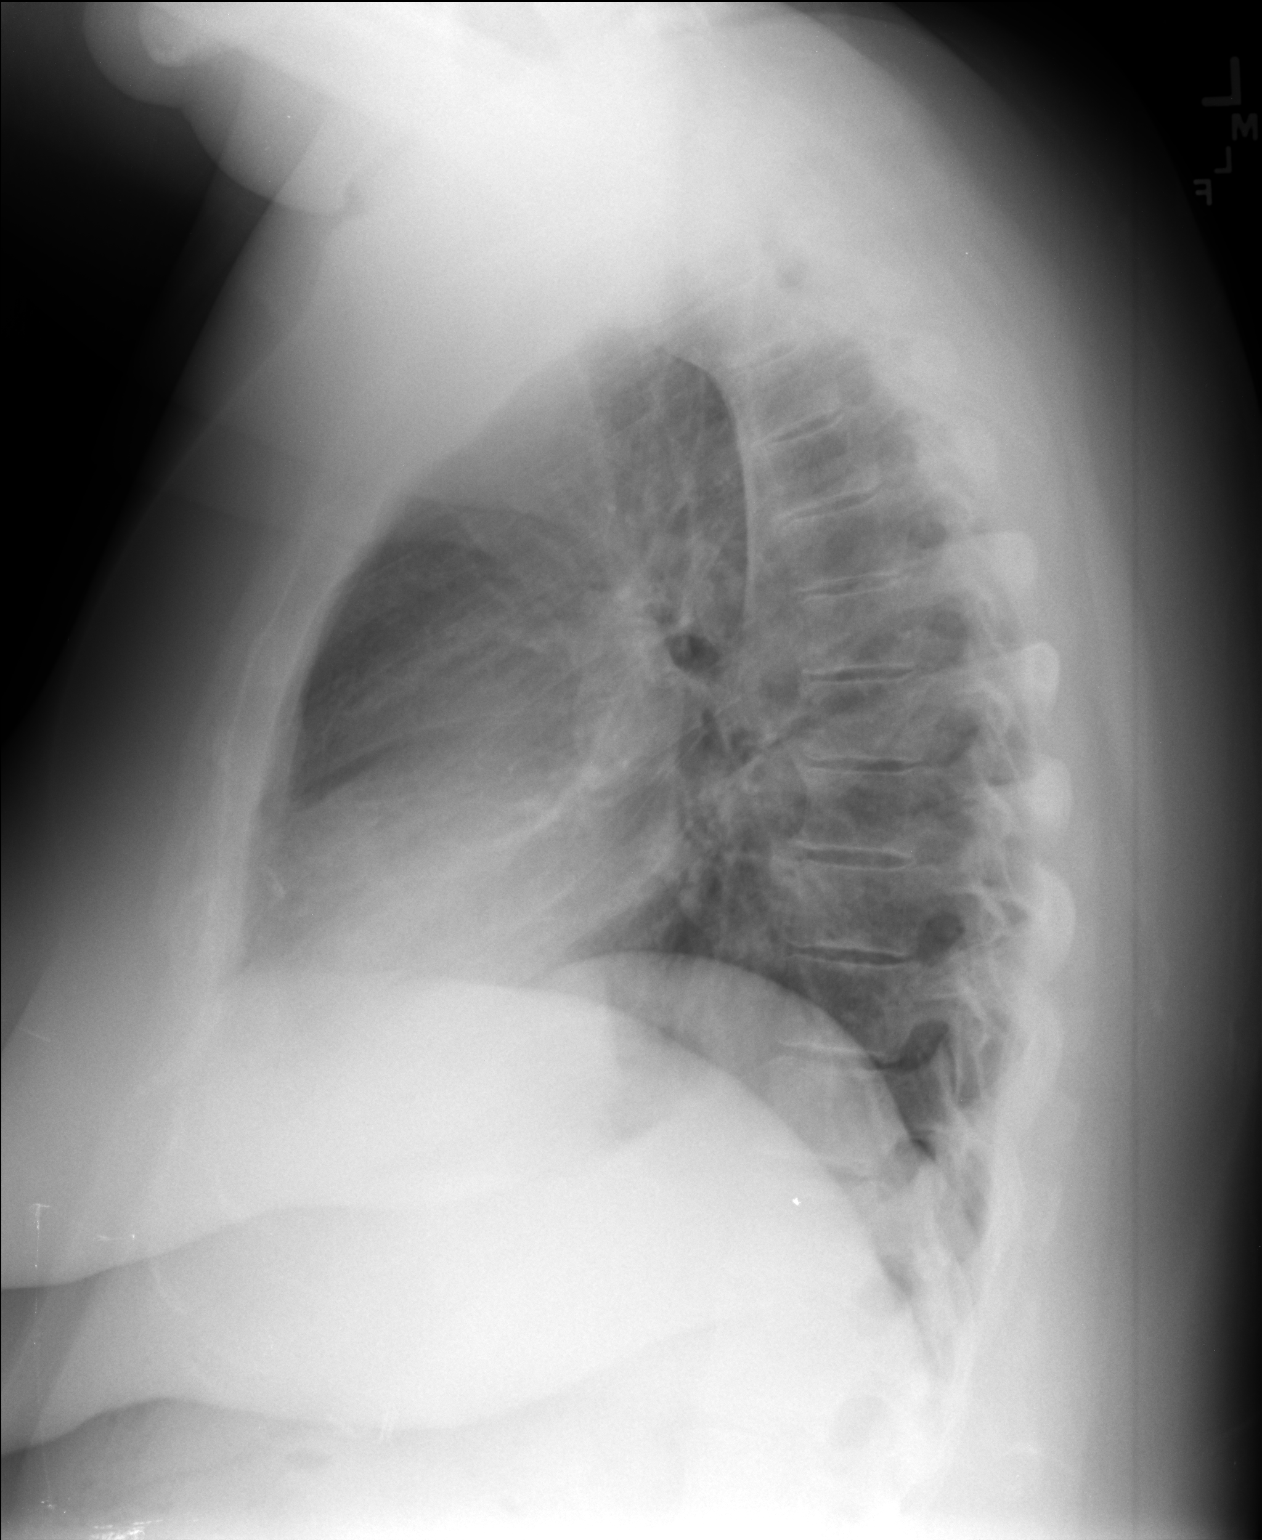

[2 of 2 positions shown; findings below may reference images not displayed]

FINDINGS: Heart, mediastinum and hila are unremarkable. Lungs are clear. No
pleural effusion or pneumothorax. Bony thorax is intact.
IMPRESSION: No active cardiopulmonary disease.

## 2015-08-18 ENCOUNTER — Encounter (HOSPITAL_COMMUNITY): Payer: Self-pay

## 2015-08-18 ENCOUNTER — Other Ambulatory Visit (HOSPITAL_BASED_OUTPATIENT_CLINIC_OR_DEPARTMENT_OTHER): Payer: BLUE CROSS/BLUE SHIELD

## 2015-08-18 ENCOUNTER — Ambulatory Visit (HOSPITAL_COMMUNITY)
Admission: RE | Admit: 2015-08-18 | Discharge: 2015-08-18 | Disposition: A | Discharge status: Home / Self Care | Payer: BLUE CROSS/BLUE SHIELD | Source: Ambulatory Visit | Attending: Gynecologic Oncology | Admitting: Gynecologic Oncology

## 2015-08-18 DIAGNOSIS — D709 Neutropenia, unspecified: Secondary | ICD-10-CM

## 2015-08-18 DIAGNOSIS — C541 Malignant neoplasm of endometrium: Secondary | ICD-10-CM | POA: Diagnosis not present

## 2015-08-18 DIAGNOSIS — Z90710 Acquired absence of both cervix and uterus: Secondary | ICD-10-CM | POA: Insufficient documentation

## 2015-08-18 DIAGNOSIS — R932 Abnormal findings on diagnostic imaging of liver and biliary tract: Secondary | ICD-10-CM | POA: Insufficient documentation

## 2015-08-18 DIAGNOSIS — D3502 Benign neoplasm of left adrenal gland: Secondary | ICD-10-CM | POA: Insufficient documentation

## 2015-08-18 DIAGNOSIS — Z8543 Personal history of malignant neoplasm of ovary: Secondary | ICD-10-CM

## 2015-08-18 DIAGNOSIS — Z923 Personal history of irradiation: Secondary | ICD-10-CM | POA: Diagnosis not present

## 2015-08-18 DIAGNOSIS — C549 Malignant neoplasm of corpus uteri, unspecified: Secondary | ICD-10-CM

## 2015-08-18 DIAGNOSIS — N1330 Unspecified hydronephrosis: Secondary | ICD-10-CM | POA: Insufficient documentation

## 2015-08-18 LAB — CBC & DIFF AND RETIC
BASO%: 1 % (ref 0.0–2.0)
Basophils Absolute: 0 10*3/uL (ref 0.0–0.1)
EOS ABS: 0.2 10*3/uL (ref 0.0–0.5)
EOS%: 4.7 % (ref 0.0–7.0)
HCT: 37.4 % (ref 34.8–46.6)
HEMOGLOBIN: 11.9 g/dL (ref 11.6–15.9)
Immature Retic Fract: 15.2 % — ABNORMAL HIGH (ref 1.60–10.00)
LYMPH#: 0.6 10*3/uL — AB (ref 0.9–3.3)
LYMPH%: 14.4 % (ref 14.0–49.7)
MCH: 27.4 pg (ref 25.1–34.0)
MCHC: 31.8 g/dL (ref 31.5–36.0)
MCV: 86.2 fL (ref 79.5–101.0)
MONO#: 0.5 10*3/uL (ref 0.1–0.9)
MONO%: 12 % (ref 0.0–14.0)
NEUT%: 67.9 % (ref 38.4–76.8)
NEUTROS ABS: 2.6 10*3/uL (ref 1.5–6.5)
Platelets: 215 10*3/uL (ref 145–400)
RBC: 4.34 10*6/uL (ref 3.70–5.45)
RDW: 15.8 % — ABNORMAL HIGH (ref 11.2–14.5)
RETIC %: 1.29 % (ref 0.70–2.10)
Retic Ct Abs: 55.99 10*3/uL (ref 33.70–90.70)
WBC: 3.8 10*3/uL — AB (ref 3.9–10.3)

## 2015-08-18 LAB — COMPREHENSIVE METABOLIC PANEL
ALT: 24 U/L (ref 0–55)
AST: 18 U/L (ref 5–34)
Albumin: 3.5 g/dL (ref 3.5–5.0)
Alkaline Phosphatase: 88 U/L (ref 40–150)
Anion Gap: 10 mEq/L (ref 3–11)
BUN: 23.1 mg/dL (ref 7.0–26.0)
CHLORIDE: 105 meq/L (ref 98–109)
CO2: 24 meq/L (ref 22–29)
Calcium: 9.8 mg/dL (ref 8.4–10.4)
Creatinine: 1.2 mg/dL — ABNORMAL HIGH (ref 0.6–1.1)
EGFR: 48 mL/min/{1.73_m2} — AB (ref >90)
GLUCOSE: 111 mg/dL (ref 70–140)
POTASSIUM: 4.1 meq/L (ref 3.5–5.1)
SODIUM: 139 meq/L (ref 136–145)
Total Bilirubin: <0.3 mg/dL (ref 0.20–1.20)
Total Protein: 7.5 g/dL (ref 6.4–8.3)

## 2015-08-18 LAB — POCT I-STAT CREATININE: Creatinine, Ser: 1.2 mg/dL — ABNORMAL HIGH (ref 0.44–1.00)

## 2015-08-18 MED ORDER — IOHEXOL 300 MG/ML  SOLN
100.0000 mL | Freq: Once | INTRAMUSCULAR | Status: AC | PRN
  Administered 2015-08-18: 100 mL via INTRAVENOUS

## 2015-08-19 ENCOUNTER — Telehealth: Payer: Self-pay | Admitting: *Deleted

## 2015-08-19 ENCOUNTER — Encounter: Payer: Self-pay | Admitting: Hematology and Oncology

## 2015-08-19 ENCOUNTER — Other Ambulatory Visit: Payer: Self-pay | Admitting: *Deleted

## 2015-08-19 ENCOUNTER — Ambulatory Visit (HOSPITAL_BASED_OUTPATIENT_CLINIC_OR_DEPARTMENT_OTHER): Payer: BLUE CROSS/BLUE SHIELD | Admitting: Hematology and Oncology

## 2015-08-19 VITALS — BP 134/48 | HR 61 | Temp 97.6°F | Resp 18 | Ht 62.0 in | Wt 229.8 lb

## 2015-08-19 DIAGNOSIS — Z8543 Personal history of malignant neoplasm of ovary: Secondary | ICD-10-CM

## 2015-08-19 DIAGNOSIS — N1330 Unspecified hydronephrosis: Secondary | ICD-10-CM | POA: Diagnosis not present

## 2015-08-19 DIAGNOSIS — Z8542 Personal history of malignant neoplasm of other parts of uterus: Secondary | ICD-10-CM

## 2015-08-19 DIAGNOSIS — D709 Neutropenia, unspecified: Secondary | ICD-10-CM

## 2015-08-19 MED ORDER — FILGRASTIM 480 MCG/1.6ML IJ SOLN: 480.0000 ug | INTRAMUSCULAR | Status: DC

## 2015-08-19 NOTE — Telephone Encounter (Signed)
-----   Message from Heath Lark, MD sent at 08/19/2015 11:05 AM EST ----- Regarding: urology Dr. Tresa Moore She had CT yesterday which showed hydronephrosis. I do not see f/up with Dr. Tresa Moore. Can you over there to see if she has appointment pending? If not, can he sees her soon to address the hydronephrosis?

## 2015-08-19 NOTE — Progress Notes (Signed)
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 Going to the patient, initial diagnosis with ovarian cancer was when she presented with abdomen and no swelling and pelvic pain leading to her urgent care visit. She states that the tumor had caused torsion of the ovary. She cannot remember the stage of her disease. She began to develop leukopenia in approximately July of 2010. She underwent an initial bone marrow biopsy at [facility] in [place] on 03/17/2009. but it was nondiagnostic. No gross dysplastic changes, no excess blasts, normal cytogenetics.  Her counts have been stable over observation through this office since December 2010 with some minor fluctuations. Total white counts run as low as 1800 and as high as 2900. Percent neutrophils fluctuates very widely from as low as 4% recorded on a CBC done 02/03/2010 to as high as 31% with average being 20%. She was hospitalized in the ICU at that time  Both hemoglobin and platelets have remained stable over time.  Despite leukopenia, she has not had any recurrent or severe infections. She stated strong family history of lupus in a cousin. She complained of occasional skin rash and joint stiffness. 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 She received G-CSF in January of 2015 to stimulate white blood cell production around a colonoscopy procedure a few years ago. She had a limited response. On 01/23/2014, repeat bone marrow biopsy excluded lymphoma or leukemia. On 09/12/2014, CT scan of the abdomen and pelvis show possible relapse of gynecological Malignancy On 10/14/2014, MRI of the pelvis show significant pelvic mass without invasion into the bladder but abutting to the rectum On 11/19/2014, she underwent surgery and had robotic-assisted lysis of adhesions, converted to laparotomy, radical upper vaginectomy and low anterior resection with colostomy. Bilateral ureteral stent placement and cystotomy repair. 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 Resection margins were negative. She was hospitalized from 11/19/2014 to 11/25/2014. In May, showed recurrent admission to the hospital due to complication with infection. On 01/29/2015, she began adjuvant radiation treatment On 03/03/2015, CT scan of the abdomen and pelvis showed status post interval removal of the bilateral nephroureteral stents. Worsening moderate right hydroureteronephrosis. Resolved left hydroureteronephrosis. On 03/20/2015, she had cystoscopy and stent placement for right hydronephrosis On 05/14/2015, CT scan of the abdomen and pelvis show unilateral right hydronephrosis with no residual cancer Since November 2016, she was able to self administer Neupogen at home.  INTERVAL HISTORY: Please see below for problem oriented charting. She feels well. Denies recent infection. She have recent right flank discomfort. She has used ibuprofen before. She denies hematuria, dysuria, urinary frequency or urgency. She denies anorexia or abnormal weight loss. She is managing colostomy well.  REVIEW OF SYSTEMS:   Constitutional: Denies fevers, chills or  abnormal weight loss Eyes: Denies blurriness of vision Ears, nose, mouth, throat, and face: Denies mucositis or sore throat  Respiratory: Denies cough, dyspnea or wheezes Cardiovascular: Denies palpitation, chest discomfort or lower extremity swelling Gastrointestinal:  Denies nausea, heartburn or change in bowel habits Skin: Denies abnormal skin rashes Lymphatics: Denies new lymphadenopathy or easy bruising Neurological:Denies numbness, tingling or new weaknesses Behavioral/Psych: Mood is stable, no new changes  All other systems were reviewed with the patient and are negative.  I have reviewed the past medical history, past surgical history, social history and family history with the patient and they are unchanged from previous note.  ALLERGIES:  is allergic to penicillins; ultram; adhesive; cefaclor; erythromycin; trimethoprim; pectin; and sulfa antibiotics.  MEDICATIONS:  Current Outpatient Prescriptions  Medication Sig Dispense Refill  . aspirin 81 MG tablet Take 81 mg by mouth daily.    . Biotin 5 MG TABS Take 1 tablet by mouth daily.     . Calcium Carbonate-Vitamin D (CALCIUM + D PO) Take 2 tablets by mouth daily.    . Canagliflozin (INVOKANA) 300 MG TABS Take 1 tablet by mouth every morning.     . Cholecalciferol (VITAMIN D3) 10000 UNITS capsule Take 10,000 Units by mouth once a week. Sundays    . diphenhydrAMINE (BENADRYL) 25 MG tablet Take 25 mg by mouth every 4 (four) hours as needed.    . diphenoxylate-atropine (LOMOTIL) 2.5-0.025 MG per tablet Take 1 tablet by mouth 4 (four) times daily as needed for diarrhea or loose stools. 60 tablet 0  . filgrastim (NEUPOGEN) 480 MCG/1.6ML injection Inject 1.6 mLs (480 mcg total) into the skin once a week. 19.2 mL 0  . fluticasone (FLOVENT HFA) 110 MCG/ACT inhaler Inhale 1 puff into the lungs as needed (SOB and Wheezing).     [name] Kitchen guaiFENesin (MUCINEX) 600 MG 12 hr tablet Take 600 mg by mouth 2 (two) times daily.    . hyaluronate sodium  (RADIAPLEXRX) GEL Apply 1 application topically 2 (two) times daily as needed.     [name] Kitchen ibuprofen (ADVIL,MOTRIN) 800 MG tablet Take 800 mg by mouth every 8 (eight) hours as needed for moderate pain (pain).    [name] Kitchen levothyroxine (SYNTHROID) 175 MCG tablet Take 175 mcg by mouth daily before breakfast.    . loperamide (IMODIUM) 2 MG capsule Take by mouth as needed for diarrhea or loose stools.    [name] Kitchen loratadine (CLARITIN) 10 MG tablet Take 10 mg by mouth daily.    . metFORMIN (GLUCOPHAGE) 1000 MG tablet Take 1,000 mg by mouth 2 (two) times daily with a meal.     . Multiple Vitamin (MULTIVITAMIN WITH MINERALS) TABS tablet Take 1 tablet by mouth daily.    . naproxen sodium (ANAPROX) 220 MG tablet Take 220 mg by mouth daily as needed.     [name] Kitchen omega-3 acid ethyl esters (LOVAZA) 1 G capsule Take 1 g by mouth 2 (two) times daily.    [name] Kitchen omeprazole (PRILOSEC) 20 MG capsule Take 20 mg by mouth at bedtime.     Glory Rosebush VERIO test strip     . Polyethyl Glycol-Propyl Glycol (SYSTANE OP) Apply 1 drop to eye daily as needed (Dry eyes).     . predniSONE (DELTASONE) 20 MG tablet Take 1 tablet (20 mg total) by mouth daily with breakfast. 10 tablet 0  . rosuvastatin (CRESTOR) 10 MG tablet Take 10 mg by mouth every evening.     . sitaGLIPtin (JANUVIA) 100 MG tablet Take 100 mg by mouth daily.    . valsartan (DIOVAN) 320 MG tablet Take 320 mg by mouth every morning.  No current facility-administered medications for this visit.    PHYSICAL EXAMINATION: ECOG PERFORMANCE STATUS: 1 - Symptomatic but completely ambulatory  Filed Vitals:   08/19/15 1242  BP: 134/48  Pulse: 61  Temp: 97.6 F (36.4 C)  Resp: 18   Filed Weights   08/19/15 1242  Weight: 229 lb 12.8 oz (104.237 kg)    GENERAL:alert, no distress and comfortable. She is obese SKIN: skin color, texture, turgor are normal, no rashes or significant lesions EYES: normal, Conjunctiva are pink and non-injected, sclera clear OROPHARYNX:no exudate, no  erythema and lips, buccal mucosa, and tongue normal  NECK: supple, thyroid normal size, non-tender, without nodularity LYMPH:  no palpable lymphadenopathy in the cervical, axillary or inguinal LUNGS: clear to auscultation and percussion with normal breathing effort HEART: regular rate & rhythm and no murmurs and no lower extremity edema ABDOMEN:abdomen soft, non-tender and normal bowel sounds. Colostomy appears to be functioning Musculoskeletal:no cyanosis of digits and no clubbing  NEURO: alert & oriented x 3 with fluent speech, no focal motor/sensory deficits  LABORATORY DATA:  I have reviewed the data as listed    Component Value Date/Time   NA 139 08/18/2015 1004   NA 140 05/02/2015 0648   K 4.1 08/18/2015 1004   K 3.5 05/02/2015 0648   CL 101 05/02/2015 0648   CO2 24 08/18/2015 1004   CO2 24 01/06/2015 1610   GLUCOSE 111 08/18/2015 1004   GLUCOSE 115* 05/02/2015 0648   BUN 23.1 08/18/2015 1004   BUN 16 05/02/2015 0648   CREATININE 1.20* 08/18/2015 1050   CREATININE 1.2* 08/18/2015 1004   CREATININE 0.80 01/06/2015 1610   CALCIUM 9.8 08/18/2015 1004   CALCIUM 9.6 01/06/2015 1610   PROT 7.5 08/18/2015 1004   PROT 6.7 12/31/2014 0638   ALBUMIN 3.5 08/18/2015 1004   ALBUMIN 2.8* 12/31/2014 0638   AST 18 08/18/2015 1004   AST 11* 12/31/2014 0638   ALT 24 08/18/2015 1004   ALT 12* 12/31/2014 0638   ALKPHOS 88 08/18/2015 1004   ALKPHOS 57 12/31/2014 0638   BILITOT <0.30 08/18/2015 1004   BILITOT 0.6 12/31/2014 0638   GFRNONAA >60 01/02/2015 0400   GFRNONAA 78 12/16/2014 1530   GFRAA >60 01/02/2015 0400   GFRAA >89 12/16/2014 1530    No results found for: SPEP, UPEP  Lab Results  Component Value Date   WBC 3.8* 08/18/2015   NEUTROABS 2.6 08/18/2015   HGB 11.9 08/18/2015   HCT 37.4 08/18/2015   MCV 86.2 08/18/2015   PLT 215 08/18/2015      Chemistry      Component Value Date/Time   NA 139 08/18/2015 1004   NA 140 05/02/2015 0648   K 4.1 08/18/2015 1004   K  3.5 05/02/2015 0648   CL 101 05/02/2015 0648   CO2 24 08/18/2015 1004   CO2 24 01/06/2015 1610   BUN 23.1 08/18/2015 1004   BUN 16 05/02/2015 0648   CREATININE 1.20* 08/18/2015 1050   CREATININE 1.2* 08/18/2015 1004   CREATININE 0.80 01/06/2015 1610      Component Value Date/Time   CALCIUM 9.8 08/18/2015 1004   CALCIUM 9.6 01/06/2015 1610   ALKPHOS 88 08/18/2015 1004   ALKPHOS 57 12/31/2014 0638   AST 18 08/18/2015 1004   AST 11* 12/31/2014 0638   ALT 24 08/18/2015 1004   ALT 12* 12/31/2014 0638   BILITOT <0.30 08/18/2015 1004   BILITOT 0.6 12/31/2014 1610       RADIOGRAPHIC STUDIES: I reviewed the  imaging study with the patient I have personally reviewed the radiological images as listed and agreed with the findings in the report. Ct Abdomen Pelvis W Contrast  08/18/2015  CLINICAL DATA:  Endometrial cancer, recurrent, status post surgery and radiation. EXAM: CT ABDOMEN AND PELVIS WITH CONTRAST TECHNIQUE: Multidetector CT imaging of the abdomen and pelvis was performed using the standard protocol following bolus administration of intravenous contrast. CONTRAST:  178m OMNIPAQUE IOHEXOL 300 MG/ML  SOLN COMPARISON:  05/14/2015. FINDINGS: Lower chest: Lung bases show no acute findings. Heart size normal. No pericardial or pleural effusion. Hepatobiliary: Liver is decreased in attenuation diffusely. Common bile duct dilatation, stable, likely related to cholecystectomy. Pancreas: Negative. Spleen: Negative. Adrenals/Urinary Tract: Right adrenal gland is unremarkable. Left adrenal nodule measures 1.6 cm and has a relative washout of 56%. Decreased attenuation of the right renal parenchyma with severe right hydronephrosis the level of surgical clips in the right anatomic pelvis. No excretion of contrast on nephrographic phase imaging. Left kidney is unremarkable. Left ureter is decompressed. Bladder is grossly unremarkable. Stomach/Bowel: Stomach and small bowel are unremarkable. Left lower  quadrant colostomy. Vascular/Lymphatic: Vascular structures are unremarkable. No pathologically enlarged lymph nodes. Reproductive: Hysterectomy.  Ovaries are not well-visualized. Other: Prevascular soft tissue thickening, grossly stable. Postoperative changes along the ventral abdominal wall. No free fluid. Mesenteries and peritoneum are otherwise unremarkable. Musculoskeletal: No worrisome lytic or sclerotic lesions. IMPRESSION: 1. Hysterectomy with stable presacral soft tissue thickening. No definitive evidence of recurrent or metastatic disease. 2. Severe right hydronephrosis, to the level of surgical clips in the right anatomic pelvis, as before, with evidence of decreased renal function. 3. Liver appears fatty. 4. Left adrenal adenoma. Electronically Signed   By: MLorin PicketM.D.   On: 08/18/2015 11:48     ASSESSMENT & PLAN:  History of endometrial cancer -Recent CT scan showed no evidence of cancer recurrence. She have close follow-up with gynecologist oncologist.  Chronic neutropenia Due to her history of recurrent infection, I recommend giving her G-CSF weekly  She responded to treatment very well without further recurrence of infection. We will continue the same.   Hydronephrosis, right She has severe right hydronephrosis with possibility of nonfunctioning kidney on the right. The serum creatinine is higher than her baseline 3 months ago. I have asked my nurse to call the urology office to get her an appointment for evaluation as soon as possible. In the meantime, I recommend she hold off taking NSAID.   Orders Placed This Encounter  Procedures  . Comprehensive metabolic panel    Standing Status: Future     Number of Occurrences:      Standing Expiration Date: 09/22/2016   All questions were answered. The patient knows to call the clinic with any problems, questions or concerns. No barriers to learning was detected. I spent 15 minutes counseling the patient face to face. The  total time spent in the appointment was 20 minutes and more than 50% was on counseling and review of test results     [facility] [place] MD 08/19/2015 1:08 PM

## 2015-08-19 NOTE — Assessment & Plan Note (Signed)
-  Recent CT scan showed no evidence of cancer recurrence. She have close follow-up with gynecologist oncologist.

## 2015-08-19 NOTE — Telephone Encounter (Signed)
[facility] to notify CT scan shows Severe Hydronephrosis.  S/w Gwen and she will make sure Dr. Tresa Moore sees results today.  They will contact pt with appointment.

## 2015-08-19 NOTE — Assessment & Plan Note (Signed)
Due to her history of recurrent infection, I recommend giving her G-CSF weekly  She responded to treatment very well without further recurrence of infection. We will continue the same.

## 2015-08-19 NOTE — Assessment & Plan Note (Signed)
She has severe right hydronephrosis with possibility of nonfunctioning kidney on the right. The serum creatinine is higher than her baseline 3 months ago. I have asked my nurse to call the urology office to get her an appointment for evaluation as soon as possible. In the meantime, I recommend she hold off taking NSAID.

## 2015-08-21 ENCOUNTER — Ambulatory Visit: Payer: BLUE CROSS/BLUE SHIELD | Admitting: Hematology and Oncology

## 2015-08-21 ENCOUNTER — Ambulatory Visit: Payer: BLUE CROSS/BLUE SHIELD

## 2015-08-21 ENCOUNTER — Other Ambulatory Visit: Payer: BLUE CROSS/BLUE SHIELD

## 2015-08-21 ENCOUNTER — Other Ambulatory Visit: Payer: Self-pay | Admitting: *Deleted

## 2015-08-21 NOTE — Telephone Encounter (Signed)
Urgent refill request for Neupogen received from [facility].   Rx was sent electronically on 1/17 and "confirmed receipt by pharmacy" in computer.  Called to check status of refill and why we are still getting refill requests when refill has already been sent.  They say the rx due to be shipped out today and they are not sure why we keep getting refill requests.

## 2015-08-25 ENCOUNTER — Other Ambulatory Visit: Payer: Self-pay | Admitting: Urology

## 2015-08-27 ENCOUNTER — Telehealth: Payer: Self-pay | Admitting: *Deleted

## 2015-08-27 ENCOUNTER — Encounter: Payer: Self-pay | Admitting: *Deleted

## 2015-08-27 NOTE — Telephone Encounter (Signed)
Pt left VM states she is having surgery on 2/3 (Dr. Tresa Moore for a Ureteral stent placement) and wants to know if Dr. Alvy Bimler has any specific instructions for taking or not taking her Neupogen in regards to having surgery?

## 2015-08-27 NOTE — Telephone Encounter (Signed)
Can you ask her what day she takes the injection? OK for her to continue the injection weekly as scheduled. Most urologist give pre-op IV antibiotics in the OR. I would suggest her to ask Dr. Tresa Moore if she needs to take oral antibiotics afterwards. If the procedure was straight forward, she may not need it

## 2015-08-27 NOTE — Telephone Encounter (Signed)
Pt takes it on Thursdays.  Informed her of Dr. Calton Dach message below.  She verbalized understanding and will take the Neupogen on Thursday which will be the day before her procedure on Friday.  She will ask Dr. Tresa Moore about the antibiotics.

## 2015-08-27 NOTE — Telephone Encounter (Signed)
Pre-Visit Call completed with patient and chart updated.   Pre-Visit Info documented in Specialty Comments under [employer].    

## 2015-08-28 ENCOUNTER — Other Ambulatory Visit: Payer: BLUE CROSS/BLUE SHIELD

## 2015-08-28 ENCOUNTER — Ambulatory Visit: Payer: BLUE CROSS/BLUE SHIELD

## 2015-08-28 ENCOUNTER — Encounter: Payer: Self-pay | Admitting: Family Medicine

## 2015-08-28 ENCOUNTER — Ambulatory Visit (INDEPENDENT_AMBULATORY_CARE_PROVIDER_SITE_OTHER): Payer: BLUE CROSS/BLUE SHIELD | Admitting: Family Medicine

## 2015-08-28 VITALS — BP 118/80 | HR 55 | Temp 97.7°F | Ht 62.5 in | Wt 233.8 lb

## 2015-08-28 DIAGNOSIS — R319 Hematuria, unspecified: Secondary | ICD-10-CM

## 2015-08-28 DIAGNOSIS — Z0000 Encounter for general adult medical examination without abnormal findings: Secondary | ICD-10-CM

## 2015-08-28 DIAGNOSIS — E785 Hyperlipidemia, unspecified: Secondary | ICD-10-CM | POA: Diagnosis not present

## 2015-08-28 DIAGNOSIS — E1151 Type 2 diabetes mellitus with diabetic peripheral angiopathy without gangrene: Secondary | ICD-10-CM

## 2015-08-28 DIAGNOSIS — Z1159 Encounter for screening for other viral diseases: Secondary | ICD-10-CM

## 2015-08-28 DIAGNOSIS — Z23 Encounter for immunization: Secondary | ICD-10-CM | POA: Diagnosis not present

## 2015-08-28 DIAGNOSIS — Z114 Encounter for screening for human immunodeficiency virus [HIV]: Secondary | ICD-10-CM

## 2015-08-28 DIAGNOSIS — I10 Essential (primary) hypertension: Secondary | ICD-10-CM

## 2015-08-28 DIAGNOSIS — E039 Hypothyroidism, unspecified: Secondary | ICD-10-CM

## 2015-08-28 LAB — POCT URINALYSIS DIPSTICK
BILIRUBIN UA: NEGATIVE
KETONES UA: NEGATIVE
Leukocytes, UA: NEGATIVE
Nitrite, UA: NEGATIVE
Protein, UA: NEGATIVE
SPEC GRAV UA: 1.02
UROBILINOGEN UA: 0.2
pH, UA: 5.5

## 2015-08-28 NOTE — Patient Instructions (Signed)
Preventive Care for Adults, Female A healthy lifestyle and preventive care can promote health and wellness. Preventive health guidelines for women include the following key practices.  A routine yearly physical is a good way to check with your health care provider about your health and preventive screening. It is a chance to share any concerns and updates on your health and to receive a thorough exam.  Visit your dentist for a routine exam and preventive care every 6 months. Brush your teeth twice a day and floss once a day. Good oral hygiene prevents tooth decay and gum disease.  The frequency of eye exams is based on your age, health, family medical history, use of contact lenses, and other factors. Follow your health care provider's recommendations for frequency of eye exams.  Eat a healthy diet. Foods like vegetables, fruits, whole grains, low-fat dairy products, and lean protein foods contain the nutrients you need without too many calories. Decrease your intake of foods high in solid fats, added sugars, and salt. Eat the right amount of calories for you.Get information about a proper diet from your health care provider, if necessary.  Regular physical exercise is one of the most important things you can do for your health. Most adults should get at least 150 minutes of moderate-intensity exercise (any activity that increases your heart rate and causes you to sweat) each week. In addition, most adults need muscle-strengthening exercises on 2 or more days a week.  Maintain a healthy weight. The body mass index (BMI) is a screening tool to identify possible weight problems. It provides an estimate of body fat based on height and weight. Your health care provider can find your BMI and can help you achieve or maintain a healthy weight.For adults 20 years and older:  A BMI below 18.5 is considered underweight.  A BMI of 18.5 to 24.9 is normal.  A BMI of 25 to 29.9 is considered overweight.  A  BMI of 30 and above is considered obese.  Maintain normal blood lipids and cholesterol levels by exercising and minimizing your intake of saturated fat. Eat a balanced diet with plenty of fruit and vegetables. Blood tests for lipids and cholesterol should begin at age 45 and be repeated every 5 years. If your lipid or cholesterol levels are high, you are over 50, or you are at high risk for heart disease, you may need your cholesterol levels checked more frequently.Ongoing high lipid and cholesterol levels should be treated with medicines if diet and exercise are not working.  If you smoke, find out from your health care provider how to quit. If you do not use tobacco, do not start.  Lung cancer screening is recommended for adults aged 45-80 years who are at high risk for developing lung cancer because of a history of smoking. A yearly low-dose CT scan of the lungs is recommended for people who have at least a 30-pack-year history of smoking and are a current smoker or have quit within the past 15 years. A pack year of smoking is smoking an average of 1 pack of cigarettes a day for 1 year (for example: 1 pack a day for 30 years or 2 packs a day for 15 years). Yearly screening should continue until the smoker has stopped smoking for at least 15 years. Yearly screening should be stopped for people who develop a health problem that would prevent them from having lung cancer treatment.  If you are pregnant, do not drink alcohol. If you are  breastfeeding, be very cautious about drinking alcohol. If you are not pregnant and choose to drink alcohol, do not have more than 1 drink per day. One drink is considered to be 12 ounces (355 mL) of beer, 5 ounces (148 mL) of wine, or 1.5 ounces (44 mL) of liquor.  Avoid use of street drugs. Do not share needles with anyone. Ask for help if you need support or instructions about stopping the use of drugs.  High blood pressure causes heart disease and increases the risk  of stroke. Your blood pressure should be checked at least every 1 to 2 years. Ongoing high blood pressure should be treated with medicines if weight loss and exercise do not work.  If you are 55-79 years old, ask your health care provider if you should take aspirin to prevent strokes.  Diabetes screening is done by taking a blood sample to check your blood glucose level after you have not eaten for a certain period of time (fasting). If you are not overweight and you do not have risk factors for diabetes, you should be screened once every 3 years starting at age 45. If you are overweight or obese and you are 40-70 years of age, you should be screened for diabetes every year as part of your cardiovascular risk assessment.  Breast cancer screening is essential preventive care for women. You should practice "breast self-awareness." This means understanding the normal appearance and feel of your breasts and may include breast self-examination. Any changes detected, no matter how small, should be reported to a health care provider. Women in their 20s and 30s should have a clinical breast exam (CBE) by a health care provider as part of a regular health exam every 1 to 3 years. After age 40, women should have a CBE every year. Starting at age 40, women should consider having a mammogram (breast X-ray test) every year. Women who have a family history of breast cancer should talk to their health care provider about genetic screening. Women at a high risk of breast cancer should talk to their health care providers about having an MRI and a mammogram every year.  Breast cancer gene (BRCA)-related cancer risk assessment is recommended for women who have family members with BRCA-related cancers. BRCA-related cancers include breast, ovarian, tubal, and peritoneal cancers. Having family members with these cancers may be associated with an increased risk for harmful changes (mutations) in the breast cancer genes BRCA1 and  BRCA2. Results of the assessment will determine the need for genetic counseling and BRCA1 and BRCA2 testing.  Your health care provider may recommend that you be screened regularly for cancer of the pelvic organs (ovaries, uterus, and vagina). This screening involves a pelvic examination, including checking for microscopic changes to the surface of your cervix (Pap test). You may be encouraged to have this screening done every 3 years, beginning at age 21.  For women ages 30-65, health care providers may recommend pelvic exams and Pap testing every 3 years, or they may recommend the Pap and pelvic exam, combined with testing for human papilloma virus (HPV), every 5 years. Some types of HPV increase your risk of cervical cancer. Testing for HPV may also be done on women of any age with unclear Pap test results.  Other health care providers may not recommend any screening for nonpregnant women who are considered low risk for pelvic cancer and who do not have symptoms. Ask your health care provider if a screening pelvic exam is right for   you.  If you have had past treatment for cervical cancer or a condition that could lead to cancer, you need Pap tests and screening for cancer for at least 20 years after your treatment. If Pap tests have been discontinued, your risk factors (such as having a new sexual partner) need to be reassessed to determine if screening should resume. Some women have medical problems that increase the chance of getting cervical cancer. In these cases, your health care provider may recommend more frequent screening and Pap tests.  Colorectal cancer can be detected and often prevented. Most routine colorectal cancer screening begins at the age of 50 years and continues through age 75 years. However, your health care provider may recommend screening at an earlier age if you have risk factors for colon cancer. On a yearly basis, your health care provider may provide home test kits to check  for hidden blood in the stool. Use of a small camera at the end of a tube, to directly examine the colon (sigmoidoscopy or colonoscopy), can detect the earliest forms of colorectal cancer. Talk to your health care provider about this at age 50, when routine screening begins. Direct exam of the colon should be repeated every 5-10 years through age 75 years, unless early forms of precancerous polyps or small growths are found.  People who are at an increased risk for hepatitis B should be screened for this virus. You are considered at high risk for hepatitis B if:  You were born in a country where hepatitis B occurs often. Talk with your health care provider about which countries are considered high risk.  Your parents were born in a high-risk country and you have not received a shot to protect against hepatitis B (hepatitis B vaccine).  You have HIV or AIDS.  You use needles to inject street drugs.  You live with, or have sex with, someone who has hepatitis B.  You get hemodialysis treatment.  You take certain medicines for conditions like cancer, organ transplantation, and autoimmune conditions.  Hepatitis C blood testing is recommended for all people born from 1945 through 1965 and any individual with known risks for hepatitis C.  Practice safe sex. Use condoms and avoid high-risk sexual practices to reduce the spread of sexually transmitted infections (STIs). STIs include gonorrhea, chlamydia, syphilis, trichomonas, herpes, HPV, and human immunodeficiency virus (HIV). Herpes, HIV, and HPV are viral illnesses that have no cure. They can result in disability, cancer, and death.  You should be screened for sexually transmitted illnesses (STIs) including gonorrhea and chlamydia if:  You are sexually active and are younger than 24 years.  You are older than 24 years and your health care provider tells you that you are at risk for this type of infection.  Your sexual activity has changed  since you were last screened and you are at an increased risk for chlamydia or gonorrhea. Ask your health care provider if you are at risk.  If you are at risk of being infected with HIV, it is recommended that you take a prescription medicine daily to prevent HIV infection. This is called preexposure prophylaxis (PrEP). You are considered at risk if:  You are sexually active and do not regularly use condoms or know the HIV status of your partner(s).  You take drugs by injection.  You are sexually active with a partner who has HIV.  Talk with your health care provider about whether you are at high risk of being infected with HIV. If   you choose to begin PrEP, you should first be tested for HIV. You should then be tested every 3 months for as long as you are taking PrEP.  Osteoporosis is a disease in which the bones lose minerals and strength with aging. This can result in serious bone fractures or breaks. The risk of osteoporosis can be identified using a bone density scan. Women ages 67 years and over and women at risk for fractures or osteoporosis should discuss screening with their health care providers. Ask your health care provider whether you should take a calcium supplement or vitamin D to reduce the rate of osteoporosis.  Menopause can be associated with physical symptoms and risks. Hormone replacement therapy is available to decrease symptoms and risks. You should talk to your health care provider about whether hormone replacement therapy is right for you.  Use sunscreen. Apply sunscreen liberally and repeatedly throughout the day. You should seek shade when your shadow is shorter than you. Protect yourself by wearing long sleeves, pants, a wide-brimmed hat, and sunglasses year round, whenever you are outdoors.  Once a month, do a whole body skin exam, using a mirror to look at the skin on your back. Tell your health care provider of new moles, moles that have irregular borders, moles that  are larger than a pencil eraser, or moles that have changed in shape or color.  Stay current with required vaccines (immunizations).  Influenza vaccine. All adults should be immunized every year.  Tetanus, diphtheria, and acellular pertussis (Td, Tdap) vaccine. Pregnant women should receive 1 dose of Tdap vaccine during each pregnancy. The dose should be obtained regardless of the length of time since the last dose. Immunization is preferred during the 27th-36th week of gestation. An adult who has not previously received Tdap or who does not know her vaccine status should receive 1 dose of Tdap. This initial dose should be followed by tetanus and diphtheria toxoids (Td) booster doses every 10 years. Adults with an unknown or incomplete history of completing a 3-dose immunization series with Td-containing vaccines should begin or complete a primary immunization series including a Tdap dose. Adults should receive a Td booster every 10 years.  Varicella vaccine. An adult without evidence of immunity to varicella should receive 2 doses or a second dose if she has previously received 1 dose. Pregnant females who do not have evidence of immunity should receive the first dose after pregnancy. This first dose should be obtained before leaving the health care facility. The second dose should be obtained 4-8 weeks after the first dose.  Human papillomavirus (HPV) vaccine. Females aged 13-26 years who have not received the vaccine previously should obtain the 3-dose series. The vaccine is not recommended for use in pregnant females. However, pregnancy testing is not needed before receiving a dose. If a female is found to be pregnant after receiving a dose, no treatment is needed. In that case, the remaining doses should be delayed until after the pregnancy. Immunization is recommended for any person with an immunocompromised condition through the age of 61 years if she did not get any or all doses earlier. During the  3-dose series, the second dose should be obtained 4-8 weeks after the first dose. The third dose should be obtained 24 weeks after the first dose and 16 weeks after the second dose.  Zoster vaccine. One dose is recommended for adults aged 30 years or older unless certain conditions are present.  Measles, mumps, and rubella (MMR) vaccine. Adults born  before 1957 generally are considered immune to measles and mumps. Adults born in 1957 or later should have 1 or more doses of MMR vaccine unless there is a contraindication to the vaccine or there is laboratory evidence of immunity to each of the three diseases. A routine second dose of MMR vaccine should be obtained at least 28 days after the first dose for students attending postsecondary schools, health care workers, or international travelers. People who received inactivated measles vaccine or an unknown type of measles vaccine during 1963-1967 should receive 2 doses of MMR vaccine. People who received inactivated mumps vaccine or an unknown type of mumps vaccine before 1979 and are at high risk for mumps infection should consider immunization with 2 doses of MMR vaccine. For females of childbearing age, rubella immunity should be determined. If there is no evidence of immunity, females who are not pregnant should be vaccinated. If there is no evidence of immunity, females who are pregnant should delay immunization until after pregnancy. Unvaccinated health care workers born before 1957 who lack laboratory evidence of measles, mumps, or rubella immunity or laboratory confirmation of disease should consider measles and mumps immunization with 2 doses of MMR vaccine or rubella immunization with 1 dose of MMR vaccine.  Pneumococcal 13-valent conjugate (PCV13) vaccine. When indicated, a person who is uncertain of his immunization history and has no record of immunization should receive the PCV13 vaccine. All adults 65 years of age and older should receive this  vaccine. An adult aged 19 years or older who has certain medical conditions and has not been previously immunized should receive 1 dose of PCV13 vaccine. This PCV13 should be followed with a dose of pneumococcal polysaccharide (PPSV23) vaccine. Adults who are at high risk for pneumococcal disease should obtain the PPSV23 vaccine at least 8 weeks after the dose of PCV13 vaccine. Adults older than 61 years of age who have normal immune system function should obtain the PPSV23 vaccine dose at least 1 year after the dose of PCV13 vaccine.  Pneumococcal polysaccharide (PPSV23) vaccine. When PCV13 is also indicated, PCV13 should be obtained first. All adults aged 65 years and older should be immunized. An adult younger than age 65 years who has certain medical conditions should be immunized. Any person who resides in a nursing home or long-term care facility should be immunized. An adult smoker should be immunized. People with an immunocompromised condition and certain other conditions should receive both PCV13 and PPSV23 vaccines. People with human immunodeficiency virus (HIV) infection should be immunized as soon as possible after diagnosis. Immunization during chemotherapy or radiation therapy should be avoided. Routine use of PPSV23 vaccine is not recommended for American Indians, [place] Natives, or people younger than 65 years unless there are medical conditions that require PPSV23 vaccine. When indicated, people who have unknown immunization and have no record of immunization should receive PPSV23 vaccine. One-time revaccination 5 years after the first dose of PPSV23 is recommended for people aged 19-64 years who have chronic kidney failure, nephrotic syndrome, asplenia, or immunocompromised conditions. People who received 1-2 doses of PPSV23 before age 65 years should receive another dose of PPSV23 vaccine at age 65 years or later if at least 5 years have passed since the previous dose. Doses of PPSV23 are not  needed for people immunized with PPSV23 at or after age 65 years.  Meningococcal vaccine. Adults with asplenia or persistent complement component deficiencies should receive 2 doses of quadrivalent meningococcal conjugate (MenACWY-D) vaccine. The doses should be obtained   at least 2 months apart. Microbiologists working with certain meningococcal bacteria, [place] recruits, people at risk during an outbreak, and people who travel to or live in countries with a high rate of meningitis should be immunized. A first-year college student up through age 34 years who is living in a residence hall should receive a dose if she did not receive a dose on or after her 16th birthday. Adults who have certain high-risk conditions should receive one or more doses of vaccine.  Hepatitis A vaccine. Adults who wish to be protected from this disease, have certain high-risk conditions, work with hepatitis A-infected animals, work in hepatitis A research labs, or travel to or work in countries with a high rate of hepatitis A should be immunized. Adults who were previously unvaccinated and who anticipate close contact with an international adoptee during the first 60 days after arrival in the [place] from a country with a high rate of hepatitis A should be immunized.  Hepatitis B vaccine. Adults who wish to be protected from this disease, have certain high-risk conditions, may be exposed to blood or other infectious body fluids, are household contacts or sex partners of hepatitis B positive people, are clients or workers in certain care facilities, or travel to or work in countries with a high rate of hepatitis B should be immunized.  Haemophilus influenzae type b (Hib) vaccine. A previously unvaccinated person with asplenia or sickle cell disease or having a scheduled splenectomy should receive 1 dose of Hib vaccine. Regardless of previous immunization, a recipient of a hematopoietic stem cell transplant should receive a  3-dose series 6-12 months after her successful transplant. Hib vaccine is not recommended for adults with HIV infection. Preventive Services / Frequency Ages 35 to 4 years  Blood pressure check.** / Every 3-5 years.  Lipid and cholesterol check.** / Every 5 years beginning at age 60.  Clinical breast exam.** / Every 3 years for women in their 71s and 10s.  BRCA-related cancer risk assessment.** / For women who have family members with a BRCA-related cancer (breast, ovarian, tubal, or peritoneal cancers).  Pap test.** / Every 2 years from ages 76 through 26. Every 3 years starting at age 61 through age 76 or 93 with a history of 3 consecutive normal Pap tests.  HPV screening.** / Every 3 years from ages 37 through ages 60 to 51 with a history of 3 consecutive normal Pap tests.  Hepatitis C blood test.** / For any individual with known risks for hepatitis C.  Skin self-exam. / Monthly.  Influenza vaccine. / Every year.  Tetanus, diphtheria, and acellular pertussis (Tdap, Td) vaccine.** / Consult your health care provider. Pregnant women should receive 1 dose of Tdap vaccine during each pregnancy. 1 dose of Td every 10 years.  Varicella vaccine.** / Consult your health care provider. Pregnant females who do not have evidence of immunity should receive the first dose after pregnancy.  HPV vaccine. / 3 doses over 6 months, if 93 and younger. The vaccine is not recommended for use in pregnant females. However, pregnancy testing is not needed before receiving a dose.  Measles, mumps, rubella (MMR) vaccine.** / You need at least 1 dose of MMR if you were born in 1957 or later. You may also need a 2nd dose. For females of childbearing age, rubella immunity should be determined. If there is no evidence of immunity, females who are not pregnant should be vaccinated. If there is no evidence of immunity, females who are  pregnant should delay immunization until after pregnancy.  Pneumococcal  13-valent conjugate (PCV13) vaccine.** / Consult your health care provider.  Pneumococcal polysaccharide (PPSV23) vaccine.** / 1 to 2 doses if you smoke cigarettes or if you have certain conditions.  Meningococcal vaccine.** / 1 dose if you are age 68 to 8 years and a Market researcher living in a residence hall, or have one of several medical conditions, you need to get vaccinated against meningococcal disease. You may also need additional booster doses.  Hepatitis A vaccine.** / Consult your health care provider.  Hepatitis B vaccine.** / Consult your health care provider.  Haemophilus influenzae type b (Hib) vaccine.** / Consult your health care provider. Ages 7 to 53 years  Blood pressure check.** / Every year.  Lipid and cholesterol check.** / Every 5 years beginning at age 25 years.  Lung cancer screening. / Every year if you are aged 11-80 years and have a 30-pack-year history of smoking and currently smoke or have quit within the past 15 years. Yearly screening is stopped once you have quit smoking for at least 15 years or develop a health problem that would prevent you from having lung cancer treatment.  Clinical breast exam.** / Every year after age 48 years.  BRCA-related cancer risk assessment.** / For women who have family members with a BRCA-related cancer (breast, ovarian, tubal, or peritoneal cancers).  Mammogram.** / Every year beginning at age 41 years and continuing for as long as you are in good health. Consult with your health care provider.  Pap test.** / Every 3 years starting at age 65 years through age 37 or 70 years with a history of 3 consecutive normal Pap tests.  HPV screening.** / Every 3 years from ages 72 years through ages 60 to 40 years with a history of 3 consecutive normal Pap tests.  Fecal occult blood test (FOBT) of stool. / Every year beginning at age 21 years and continuing until age 5 years. You may not need to do this test if you get  a colonoscopy every 10 years.  Flexible sigmoidoscopy or colonoscopy.** / Every 5 years for a flexible sigmoidoscopy or every 10 years for a colonoscopy beginning at age 35 years and continuing until age 48 years.  Hepatitis C blood test.** / For all people born from [birth year] through 1965 and any individual with known risks for hepatitis C.  Skin self-exam. / Monthly.  Influenza vaccine. / Every year.  Tetanus, diphtheria, and acellular pertussis (Tdap/Td) vaccine.** / Consult your health care provider. Pregnant women should receive 1 dose of Tdap vaccine during each pregnancy. 1 dose of Td every 10 years.  Varicella vaccine.** / Consult your health care provider. Pregnant females who do not have evidence of immunity should receive the first dose after pregnancy.  Zoster vaccine.** / 1 dose for adults aged 30 years or older.  Measles, mumps, rubella (MMR) vaccine.** / You need at least 1 dose of MMR if you were born in 1957 or later. You may also need a second dose. For females of childbearing age, rubella immunity should be determined. If there is no evidence of immunity, females who are not pregnant should be vaccinated. If there is no evidence of immunity, females who are pregnant should delay immunization until after pregnancy.  Pneumococcal 13-valent conjugate (PCV13) vaccine.** / Consult your health care provider.  Pneumococcal polysaccharide (PPSV23) vaccine.** / 1 to 2 doses if you smoke cigarettes or if you have certain conditions.  Meningococcal vaccine.** /  Consult your health care provider.  Hepatitis A vaccine.** / Consult your health care provider.  Hepatitis B vaccine.** / Consult your health care provider.  Haemophilus influenzae type b (Hib) vaccine.** / Consult your health care provider. Ages 64 years and over  Blood pressure check.** / Every year.  Lipid and cholesterol check.** / Every 5 years beginning at age 23 years.  Lung cancer screening. / Every year if you  are aged 16-80 years and have a 30-pack-year history of smoking and currently smoke or have quit within the past 15 years. Yearly screening is stopped once you have quit smoking for at least 15 years or develop a health problem that would prevent you from having lung cancer treatment.  Clinical breast exam.** / Every year after age 74 years.  BRCA-related cancer risk assessment.** / For women who have family members with a BRCA-related cancer (breast, ovarian, tubal, or peritoneal cancers).  Mammogram.** / Every year beginning at age 44 years and continuing for as long as you are in good health. Consult with your health care provider.  Pap test.** / Every 3 years starting at age 58 years through age 22 or 39 years with 3 consecutive normal Pap tests. Testing can be stopped between 65 and 70 years with 3 consecutive normal Pap tests and no abnormal Pap or HPV tests in the past 10 years.  HPV screening.** / Every 3 years from ages 64 years through ages 70 or 61 years with a history of 3 consecutive normal Pap tests. Testing can be stopped between 65 and 70 years with 3 consecutive normal Pap tests and no abnormal Pap or HPV tests in the past 10 years.  Fecal occult blood test (FOBT) of stool. / Every year beginning at age 40 years and continuing until age 27 years. You may not need to do this test if you get a colonoscopy every 10 years.  Flexible sigmoidoscopy or colonoscopy.** / Every 5 years for a flexible sigmoidoscopy or every 10 years for a colonoscopy beginning at age 7 years and continuing until age 32 years.  Hepatitis C blood test.** / For all people born from [birth year] through 1966 and any individual with known risks for hepatitis C.  Osteoporosis screening.** / A one-time screening for women ages 30 years and over and women at risk for fractures or osteoporosis.  Skin self-exam. / Monthly.  Influenza vaccine. / Every year.  Tetanus, diphtheria, and acellular pertussis (Tdap/Td)  vaccine.** / 1 dose of Td every 10 years.  Varicella vaccine.** / Consult your health care provider.  Zoster vaccine.** / 1 dose for adults aged 35 years or older.  Pneumococcal 13-valent conjugate (PCV13) vaccine.** / Consult your health care provider.  Pneumococcal polysaccharide (PPSV23) vaccine.** / 1 dose for all adults aged 46 years and older.  Meningococcal vaccine.** / Consult your health care provider.  Hepatitis A vaccine.** / Consult your health care provider.  Hepatitis B vaccine.** / Consult your health care provider.  Haemophilus influenzae type b (Hib) vaccine.** / Consult your health care provider. ** Family history and personal history of risk and conditions may change your health care provider's recommendations.   This information is not intended to replace advice given to you by your health care provider. Make sure you discuss any questions you have with your health care provider.   Document Released: 09/14/2001 Document Revised: 08/09/2014 Document Reviewed: 12/14/2010 Elsevier Interactive Patient Education [employer].

## 2015-08-28 NOTE — Progress Notes (Signed)
Subjective:     [name] is a 60 y.o. female and is here for a comprehensive physical exam. The patient reports no problems.  Social History   Social History  . Marital Status: Married    Spouse Name: N/A  . Number of Children: 1  . Years of Education: N/A   Occupational History  . retired Therapist, sports from [place] History Main Topics  . Smoking status: Never Smoker   . Smokeless tobacco: Never Used  . Alcohol Use: Yes     Comment: rare social  . Drug Use: No  . Sexual Activity: Not Currently   Other Topics Concern  . Not on file   Social History Narrative   Exercise-- has not gotten back into it since cancer came back   Health Maintenance  Topic Date Due  . Hepatitis C Screening  October 14, 1955  . PNEUMOCOCCAL POLYSACCHARIDE VACCINE (1) 08/10/1956  . FOOT EXAM  08/10/1964  . OPHTHALMOLOGY EXAM  08/10/1964  . HIV Screening  08/10/1969  . TETANUS/TDAP  08/02/2006  . HEMOGLOBIN A1C  05/23/2015  . ZOSTAVAX  08/26/2025 (Originally 08/10/2014)  . MAMMOGRAM  10/09/2015  . INFLUENZA VACCINE  03/02/2016  . PAP SMEAR  06/19/2016  . COLONOSCOPY  08/22/2023    The following portions of the patient's history were reviewed and updated as appropriate:  She  has a past medical history of Benign essential HTN (12/14/2011); Multiple thyroid nodules; GERD (gastroesophageal reflux disease); Depression; Dysuria; Radiation-induced dermatitis; History of gastric polyp; History of radiation therapy; Hypothyroidism; DM type 2 (diabetes mellitus, type 2) ([place]); Hiatal hernia; Anemia in neoplastic disease; Chronic idiopathic neutropenia ([place]); Ureteral stricture, right; S/P colostomy ([place]); Hydronephrosis; History of renal stent; Neutropenia ([place]); and Cancer of corpus uteri, except isthmus [facility]) (oncologist-- dr Denman [name] and dr Alvy Bimler ). She  does not have any pertinent problems on file. She  has past surgical history that includes Appendectomy; Tonsillectomy; Eye surgery; Colonoscopy with propofol  (N/A, 08/21/2013); EUS (N/A, 10/02/2014); Robotic assisted lap vaginal hysterectomy (N/A, 11/19/2014); Ostomy (N/A, 11/19/2014); Colostomy takedown (N/A, 12/04/2014); Cystoscopy with retrograde pyelogram, ureteroscopy and stent placement (Right, 03/20/2015); EXCISION SOFT TISSUE MASS RIGHT FOREMAN (12-08-2006); Abdominal hysterectomy (2006); Laparoscopic cholecystectomy; and Cystoscopy with retrograde pyelogram, ureteroscopy and stent placement (Right, 05/02/2015). Her family history includes Cancer in her father and mother; Diabetes in her brother, father, and sister; Heart disease in her brother and father; Hypertension in her mother; Hypertension (age of onset: y-10) in her brother. She  reports that she has never smoked. She has never used smokeless tobacco. She reports that she drinks alcohol. She reports that she does not use illicit drugs. She has a current medication list which includes the following prescription(s): aspirin, biotin, canagliflozin, vitamin d3, diphenhydramine, filgrastim, fluticasone, hyaluronate sodium, levothyroxine, loratadine, metformin, multivitamin with minerals, omega-3 acid ethyl esters, omeprazole, polyethyl glycol-propyl glycol, rosuvastatin, sitagliptin, and valsartan. Current Outpatient Prescriptions on File Prior to Visit  Medication Sig Dispense Refill  . aspirin 81 MG tablet Take 81 mg by mouth at bedtime. Reported on 08/27/2015    . Biotin 5 MG TABS Take 1 tablet by mouth every morning.     . Canagliflozin (INVOKANA) 300 MG TABS Take 1 tablet by mouth every morning.     . Cholecalciferol (VITAMIN D3) 10000 UNITS capsule Take 10,000 Units by mouth once a week. Sundays    . diphenhydrAMINE (BENADRYL) 25 MG tablet Take 25 mg by mouth every 4 (four) hours as needed for itching or allergies.     [name] Kitchen  filgrastim (NEUPOGEN) 480 MCG/1.6ML injection Inject 1.6 mLs (480 mcg total) into the skin once a week. 19.2 mL 0  . fluticasone (FLOVENT HFA) 110 MCG/ACT inhaler Inhale 1 puff into  the lungs as needed (SOB and Wheezing). Reported on 08/27/2015    . hyaluronate sodium (RADIAPLEXRX) GEL Apply 1 application topically 2 (two) times daily as needed (scars).     [name] Kitchen levothyroxine (SYNTHROID) 175 MCG tablet Take 175 mcg by mouth daily before breakfast.    . loratadine (CLARITIN) 10 MG tablet Take 10 mg by mouth daily.    . metFORMIN (GLUCOPHAGE) 1000 MG tablet Take 1,000 mg by mouth 2 (two) times daily with a meal.     . Multiple Vitamin (MULTIVITAMIN WITH MINERALS) TABS tablet Take 1 tablet by mouth daily.    [name] Kitchen omega-3 acid ethyl esters (LOVAZA) 1 G capsule Take 1 g by mouth 2 (two) times daily.    [name] Kitchen omeprazole (PRILOSEC) 20 MG capsule Take 20 mg by mouth at bedtime.     Vladimir Faster Glycol-Propyl Glycol (SYSTANE OP) Apply 1 drop to eye at bedtime.     . rosuvastatin (CRESTOR) 10 MG tablet Take 10 mg by mouth every evening.     . sitaGLIPtin (JANUVIA) 100 MG tablet Take 100 mg by mouth daily.    . valsartan (DIOVAN) 320 MG tablet Take 320 mg by mouth every morning.      No current facility-administered medications on file prior to visit.   She is allergic to penicillins; ultram; adhesive; cefaclor; erythromycin; trimethoprim; pectin; and sulfa antibiotics..  Review of Systems Review of Systems  Constitutional: Negative for activity change, appetite change and fatigue.  HENT: Negative for hearing loss, congestion, tinnitus and ear discharge.  dentist q65mEyes: Negative for visual disturbance (see optho q1y -- vision corrected to 20/20 with glasses).  Respiratory: Negative for cough, chest tightness and shortness of breath.   Cardiovascular: Negative for chest pain, palpitations and leg swelling.  Gastrointestinal: Negative for abdominal pain, diarrhea, constipation and abdominal distention.  Genitourinary: Negative for urgency, frequency, decreased urine volume and difficulty urinating.  Musculoskeletal: Negative for back pain, arthralgias and gait problem.  Skin: Negative for  color change, pallor and rash.  Neurological: Negative for dizziness, light-headedness, numbness and headaches.  Hematological: Negative for adenopathy. Does not bruise/bleed easily.  Psychiatric/Behavioral: Negative for suicidal ideas, confusion, sleep disturbance, self-injury, dysphoric mood, decreased concentration and agitation.       Objective:    BP 118/80 mmHg  Pulse 55  Temp(Src) 97.7 F (36.5 C) (Oral)  Ht 5' 2.5" (1.588 m)  Wt 233 lb 12.8 oz (106.051 kg)  BMI 42.05 kg/m2  SpO2 99% General appearance: alert, cooperative, appears stated age and no distress Head: Normocephalic, without obvious abnormality, atraumatic Eyes: conjunctivae/corneas clear. PERRL, EOM's intact. Fundi benign. Ears: normal TM's and external ear canals both ears Nose: Nares normal. Septum midline. Mucosa normal. No drainage or sinus tenderness. Throat: lips, mucosa, and tongue normal; teeth and gums normal Neck: no adenopathy, no carotid bruit, no JVD, supple, symmetrical, trachea midline and thyroid not enlarged, symmetric, no tenderness/mass/nodules Back: symmetric, no curvature. ROM normal. No CVA tenderness. Lungs: clear to auscultation bilaterally Breasts: normal appearance, no masses or tenderness Heart: regular rate and rhythm, S1, S2 normal, no murmur, click, rub or gallop Abdomen: soft, non-tender; bowel sounds normal; no masses,  no organomegaly Pelvic: cervix normal in appearance, external genitalia normal, no adnexal masses or tenderness, no cervical motion tenderness, rectovaginal septum normal, uterus normal size, shape,  and consistency and vagina normal without discharge Extremities: extremities normal, atraumatic, no cyanosis or edema Pulses: 2+ and symmetric Skin: Skin color, texture, turgor normal. No rashes or lesions Lymph nodes: Cervical, supraclavicular, and axillary nodes normal. Neurologic: Alert and oriented X 3, normal strength and tone. Normal symmetric reflexes. Normal  coordination and gait Psych- no depression, no anxiety      Assessment:    Healthy female exam.       Plan:  Check labs    ghm utd See After Visit Summary for Counseling Recommendations    1. Preventative health care See above - TSH - POCT urinalysis dipstick - Microalbumin / creatinine urine ratio - Lipid panel - CBC with Differential/Platelet - Comp Met (CMET) - Hepatitis B surface antibody  2. DM (diabetes mellitus) type II controlled peripheral vascular disorder (HCC) Per endo - TSH - POCT urinalysis dipstick - Microalbumin / creatinine urine ratio - Lipid panel - CBC with Differential/Platelet - Comp Met (CMET)  3. Essential hypertension Stable con't diovan - TSH - POCT urinalysis dipstick - Microalbumin / creatinine urine ratio - Lipid panel - CBC with Differential/Platelet - Comp Met (CMET)  4. Hyperlipidemia LDL goal <70   - Lipid panel - Comp Met (CMET)  5. Hypothyroidism, unspecified hypothyroidism type Cont synthroid - TSH  6. Need for hepatitis C screening test   - Hepatitis C antibody  7. Encounter for screening for HIV   - HIV antibody  8. Hematuria   - Urine culture

## 2015-08-28 NOTE — Progress Notes (Signed)
Pre visit review using our clinic review tool, if applicable. No additional management support is needed unless otherwise documented below in the visit note. 

## 2015-08-29 ENCOUNTER — Telehealth: Payer: Self-pay | Admitting: *Deleted

## 2015-08-29 LAB — COMPREHENSIVE METABOLIC PANEL
ALT: 22 U/L (ref 0–35)
AST: 17 U/L (ref 0–37)
Albumin: 4.1 g/dL (ref 3.5–5.2)
Alkaline Phosphatase: 79 U/L (ref 39–117)
BUN: 25 mg/dL — ABNORMAL HIGH (ref 6–23)
CALCIUM: 9.6 mg/dL (ref 8.4–10.5)
CHLORIDE: 102 meq/L (ref 96–112)
CO2: 28 meq/L (ref 19–32)
CREATININE: 1.11 mg/dL (ref 0.40–1.20)
GFR: 53.1 mL/min — AB (ref >60.00)
GLUCOSE: 77 mg/dL (ref 70–99)
Potassium: 3.7 mEq/L (ref 3.5–5.1)
Sodium: 140 mEq/L (ref 135–145)
Total Bilirubin: 0.4 mg/dL (ref 0.2–1.2)
Total Protein: 7.1 g/dL (ref 6.0–8.3)

## 2015-08-29 LAB — CBC WITH DIFFERENTIAL/PLATELET
BASOS ABS: 0 10*3/uL (ref 0.0–0.1)
BASOS PCT: 1.2 % (ref 0.0–3.0)
EOS ABS: 0.2 10*3/uL (ref 0.0–0.7)
Eosinophils Relative: 10.4 % — ABNORMAL HIGH (ref 0.0–5.0)
HEMATOCRIT: 36 % (ref 36.0–46.0)
Hemoglobin: 11.7 g/dL — ABNORMAL LOW (ref 12.0–15.0)
LYMPHS ABS: 0.5 10*3/uL — AB (ref 0.7–4.0)
LYMPHS PCT: 34.1 % (ref 12.0–46.0)
MCHC: 32.5 g/dL (ref 30.0–36.0)
MCV: 84.3 fl (ref 78.0–100.0)
Monocytes Absolute: 0.5 10*3/uL (ref 0.1–1.0)
Monocytes Relative: 34.9 % — ABNORMAL HIGH (ref 3.0–12.0)
NEUTROS ABS: 0.3 10*3/uL — AB (ref 1.4–7.7)
NEUTROS PCT: 19.4 % — AB (ref 43.0–77.0)
PLATELETS: 284 10*3/uL (ref 150.0–400.0)
RBC: 4.28 Mil/uL (ref 3.87–5.11)
RDW: 16.9 % — AB (ref 11.5–15.5)
WBC: 1.5 10*3/uL — CL (ref 4.0–10.5)

## 2015-08-29 LAB — HIV ANTIBODY (ROUTINE TESTING W REFLEX): HIV: NONREACTIVE

## 2015-08-29 LAB — LIPID PANEL
CHOL/HDL RATIO: 2
CHOLESTEROL: 109 mg/dL (ref 0–200)
HDL: 43.8 mg/dL (ref >39.00)
LDL CALC: 45 mg/dL (ref 0–99)
NonHDL: 64.8
TRIGLYCERIDES: 99 mg/dL (ref 0.0–149.0)
VLDL: 19.8 mg/dL (ref 0.0–40.0)

## 2015-08-29 LAB — TSH: TSH: 0.63 u[IU]/mL (ref 0.35–4.50)

## 2015-08-29 LAB — HEPATITIS B SURFACE ANTIBODY, QUANTITATIVE: HEPATITIS B-POST: 0 m[IU]/mL

## 2015-08-29 LAB — MICROALBUMIN / CREATININE URINE RATIO
Creatinine,U: 42.9 mg/dL
Microalb Creat Ratio: 1.6 mg/g (ref 0.0–30.0)
Microalb, Ur: <0.7 mg/dL (ref 0.0–1.9)

## 2015-08-29 LAB — HEPATITIS C ANTIBODY: HCV Ab: NEGATIVE

## 2015-08-29 NOTE — Telephone Encounter (Signed)
Pt sees oncology for this

## 2015-08-29 NOTE — Telephone Encounter (Signed)
[facility] lab reporting critical .. pts white count @ 1.5

## 2015-08-30 LAB — URINE CULTURE

## 2015-09-01 ENCOUNTER — Encounter (HOSPITAL_COMMUNITY): Payer: Self-pay | Admitting: *Deleted

## 2015-09-03 IMAGING — CT CT ABD-PELV W/ CM
2 of 5 series · 16 of 46 positions shown, 18 images · IV contrast (OMNIPAQUE)
Comparison: Abdominal pelvic CT 11/08/2007.

CLINICAL DATA: Ovarian cancer diagnosed in 8113 status post
hysterectomy, appendectomy and chemotherapy completed. Pelvic pain
and pressure during bowel movements with vaginal spotting. Initial
encounter.

EXAM:
CT ABDOMEN AND PELVIS WITH CONTRAST
TECHNIQUE: Multidetector CT imaging of the abdomen and pelvis was performed
using the standard protocol following bolus administration of
intravenous contrast.
CONTRAST:  100mL OMNIPAQUE IOHEXOL 300 MG/ML  SOLN

[Series 2: rtn a/p with · axial · 0.79mm/px · z∈[-456,-81]mm · 13 of 87 slices shown, 15 images]
[im 6/87  soft-tissue]
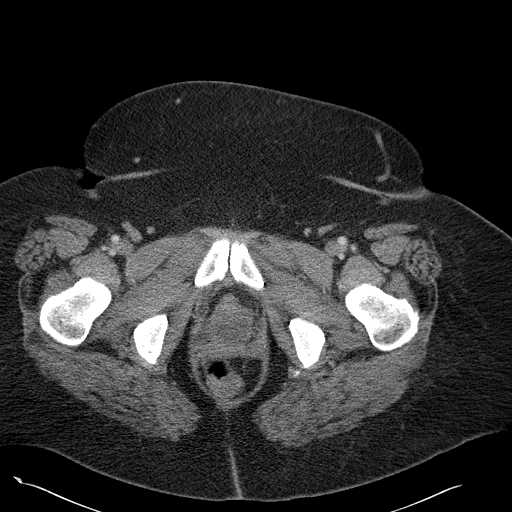
[im 6/87  bone]
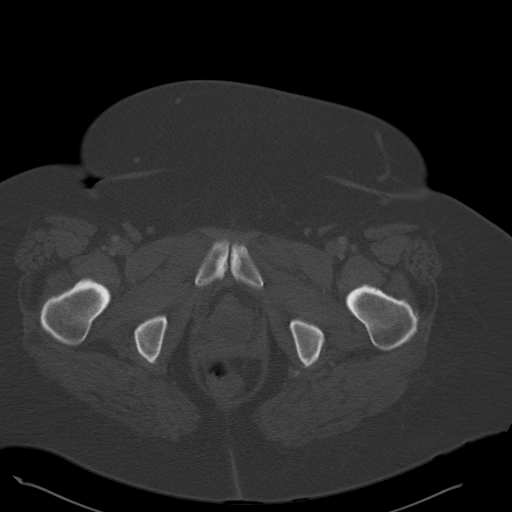
[im 11/87  soft-tissue]
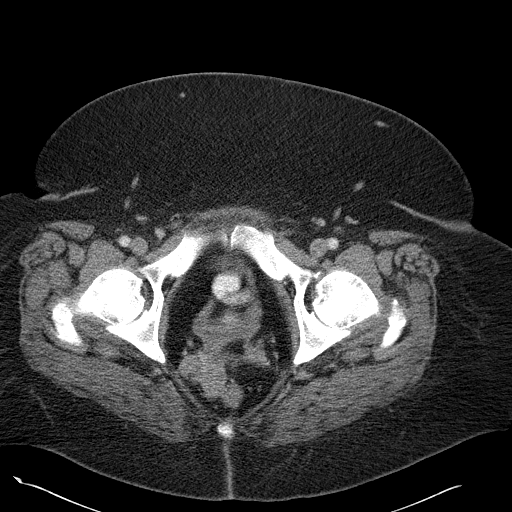
[im 21/87  soft-tissue]
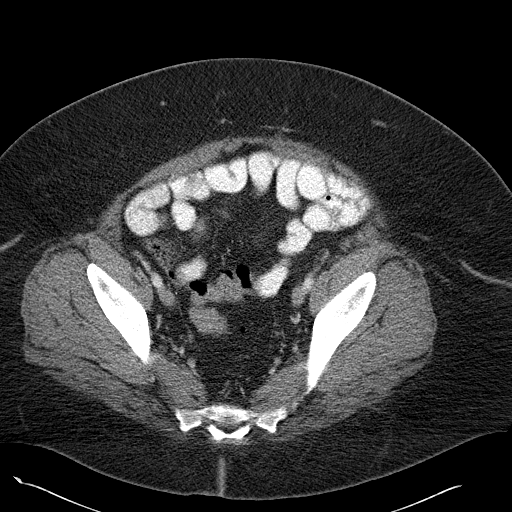
[im 26/87  soft-tissue]
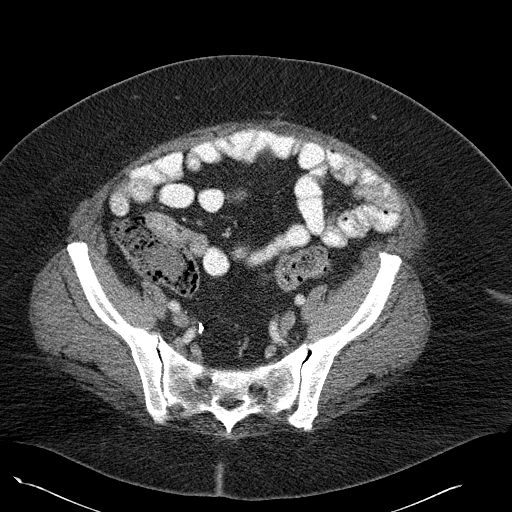
[im 31/87  soft-tissue]
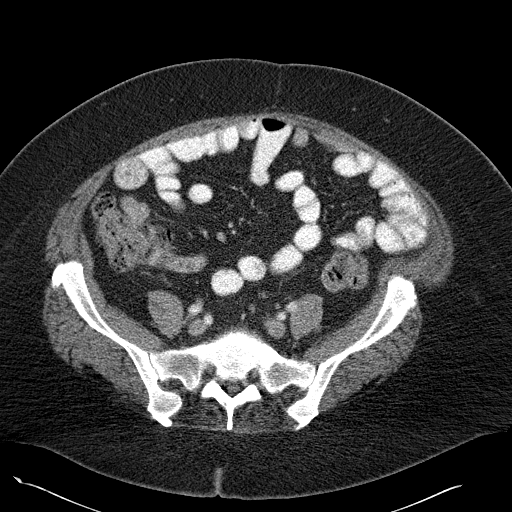
[im 36/87  soft-tissue]
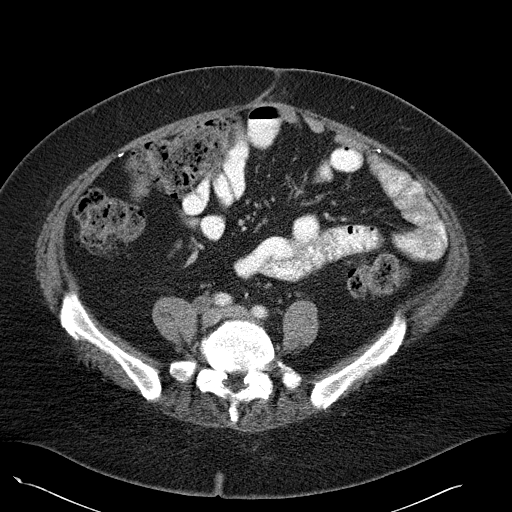
[im 46/87  soft-tissue]
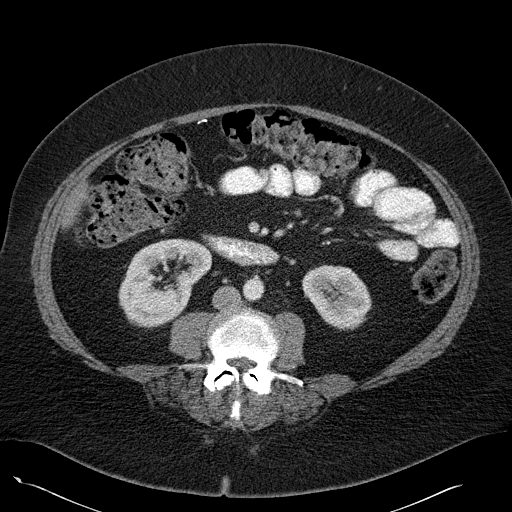
[im 51/87  soft-tissue]
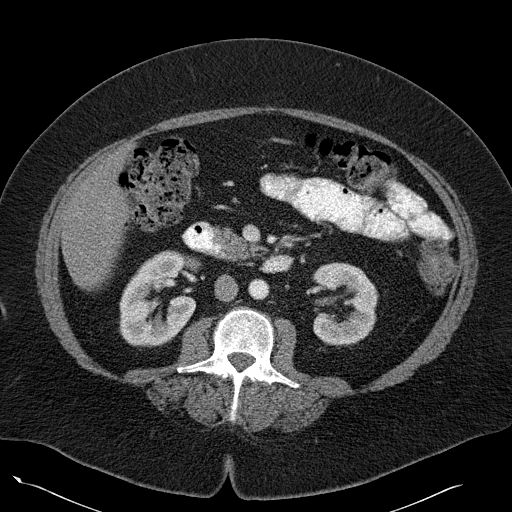
[im 56/87  soft-tissue]
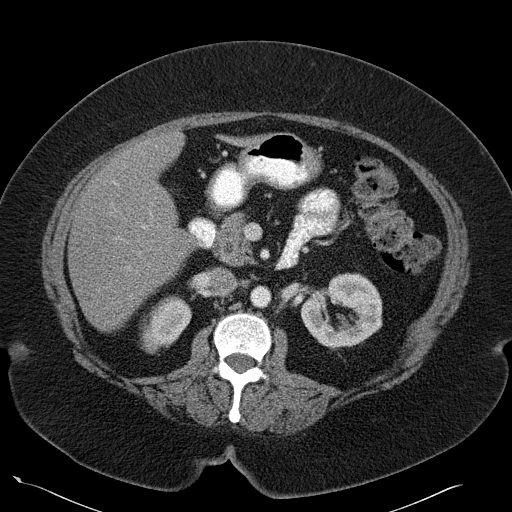
[im 56/87  bone]
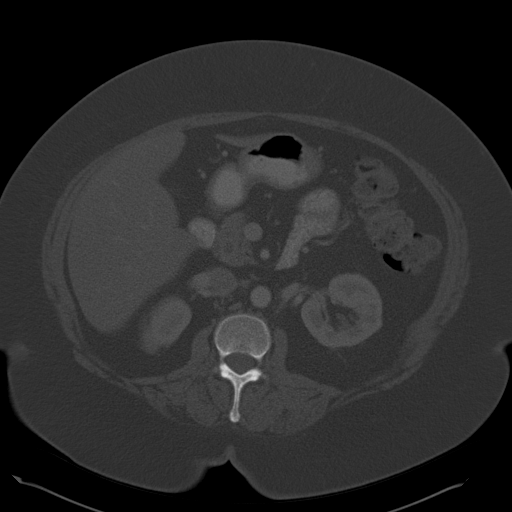
[im 61/87  soft-tissue]
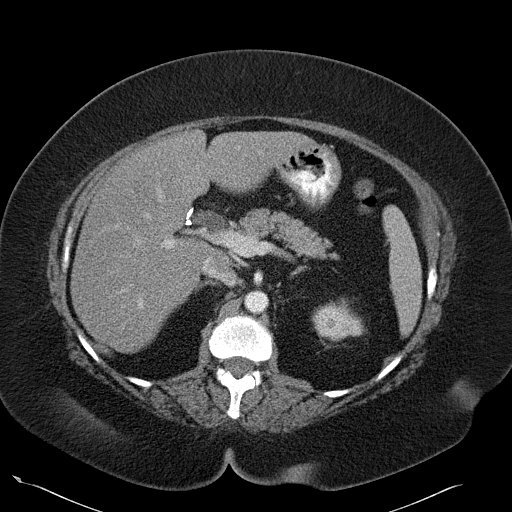
[im 66/87  soft-tissue]
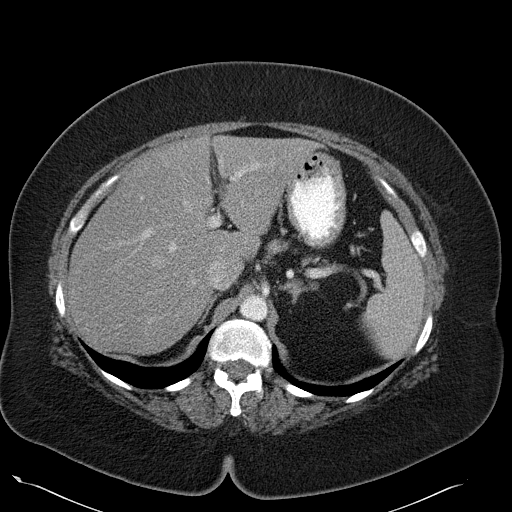
[im 76/87  soft-tissue]
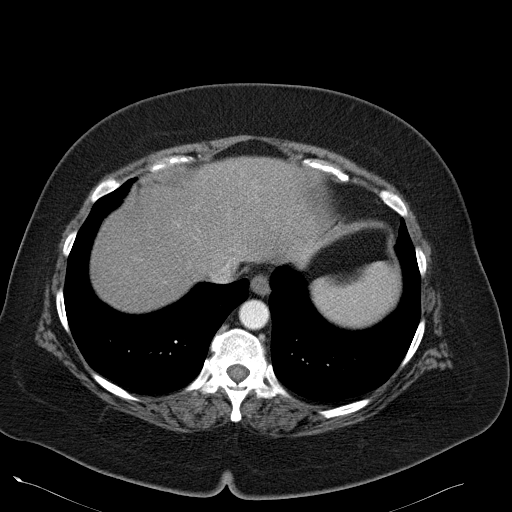
[im 81/87  soft-tissue]
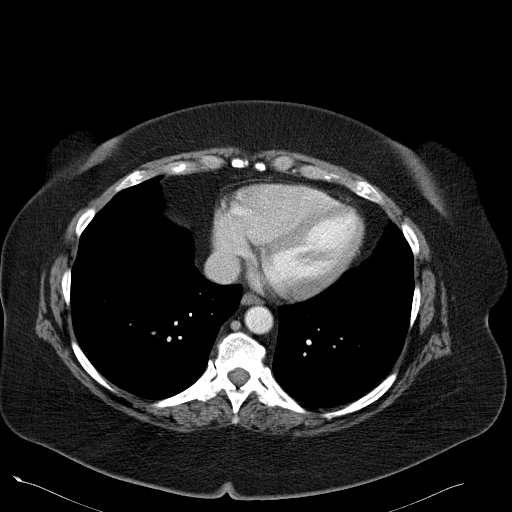

[Series 602: <mpr thick range> · coronal · 0.85mm/px · 3 of 104 slices shown]
[im 35/104  soft-tissue]
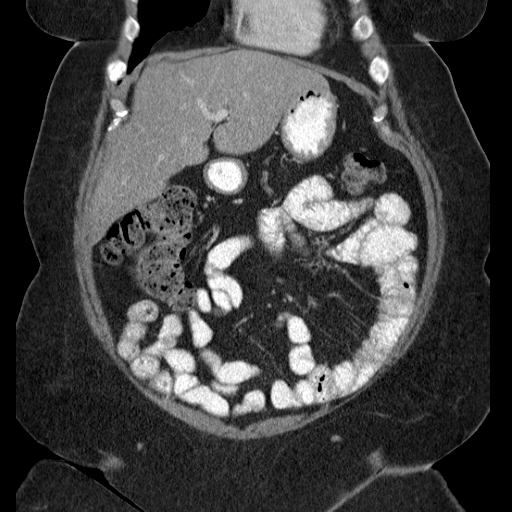
[im 46/104  soft-tissue]
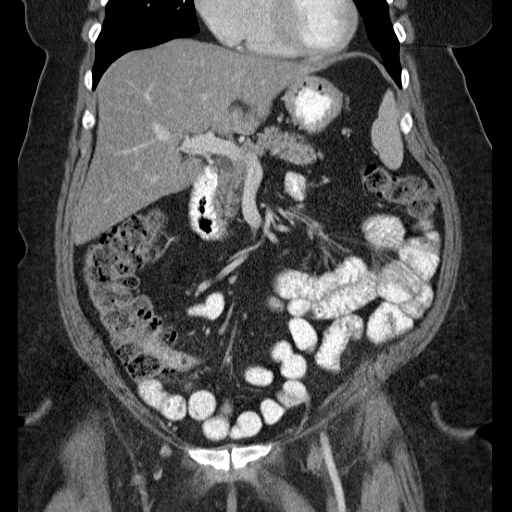
[im 58/104  soft-tissue]
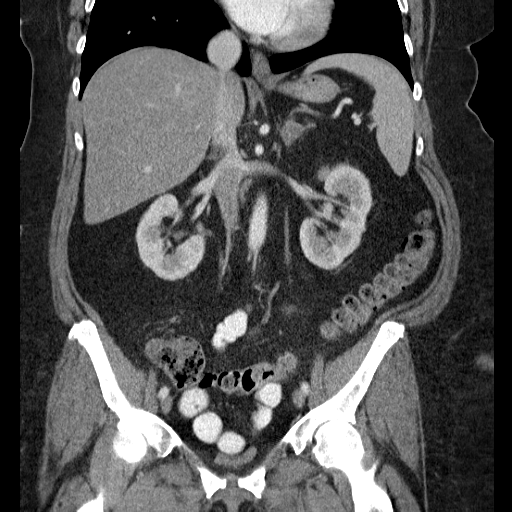

[16 of 46 positions shown; findings below may reference images not displayed]

FINDINGS: Lower chest: Clear lung bases. No significant pleural or pericardial
effusion.

Hepatobiliary: Stable appearance of the liver without focal lesion.
Stable extrahepatic biliary dilatation status post cholecystectomy.
The common hepatic duct measures up to 13 mm in diameter. No
evidence of intraductal calculus.

Pancreas: Unremarkable. No pancreatic ductal dilatation or
surrounding inflammatory changes.

Spleen: Normal in size without focal abnormality.

Adrenals/Urinary Tract: The left adrenal gland is minimally more
prominent than on the prior study without focal mass. The right
adrenal gland appears normal.The kidneys appear normal without
evidence of urinary tract calculus, hydronephrosis or mass. The
bladder is nearly empty and suboptimally evaluated, although
demonstrates no gross abnormality.

Stomach/Bowel: No evidence of bowel wall thickening, distention or
surrounding inflammatory change.There is moderate stool throughout
the colon. There are surgical clips within the omentum. No
generalized ascites or peritoneal nodularity identified.

Vascular/Lymphatic: There are no enlarged abdominal or pelvic lymph
nodes. Pelvic lymphadenectomy clips noted. There is a stable 1.8 cm
low-density lesion adjacent to the right common iliac vessels on
image 52, consistent with a postoperative lymphocele. No significant
vascular findings demonstrated.

Reproductive: Status post hysterectomy. There is a new right
perirectal mass abutting the vaginal cuff, measuring 3.8 x 4.9 cm on
image 78. There is a limited fat plane between this mass and the
rectum posteriorly. Rectal invasion cannot be excluded. No other
pelvic mass is identified. Pelvic floor laxity likely.

Other: No evidence of abdominal wall mass or hernia.

Musculoskeletal: No acute or significant osseous findings. Mild
lumbar spine degenerative changes noted.
IMPRESSION: 1. New right pelvic mass abutting the vaginal cuff and rectum,
highly worrisome for recurrent ovarian cancer. Rectal lesion cannot
be excluded. This lesion should be amenable to transrectal or trans
gluteal biopsy.
2. No other evidence of metastatic disease demonstrated. There is no
ascites or generalized peritoneal nodularity.
3. No evidence of hydronephrosis.
4. Stable biliary dilatation status post cholecystectomy.
5. These results will be called to the ordering clinician or
representative by the Radiologist Assistant, and communication
documented in the PACS or zVision Dashboard.

## 2015-09-03 NOTE — Anesthesia Preprocedure Evaluation (Addendum)
Anesthesia Evaluation  Patient identified by MRN, date of birth, ID band Patient awake  General Assessment Comment:   Reviewed: Allergy & Precautions, NPO status , Patient's Chart, lab work & pertinent test results  Airway Mallampati: II  TM Distance: >3 FB Neck ROM: Full    Dental no notable dental hx. (+) Teeth Intact, Dental Advisory Given   Pulmonary neg pulmonary ROS,    Pulmonary exam normal breath sounds clear to auscultation       Cardiovascular hypertension, Pt. on medications Normal cardiovascular exam+ dysrhythmias  Rhythm:Regular Rate:Normal  EKG 2015 Low voltage otherwise normal   Neuro/Psych PSYCHIATRIC DISORDERS Depression  Neuromuscular disease    GI/Hepatic Neg liver ROS, hiatal hernia, GERD  Medicated,  Endo/Other  diabetes, Type 2, Oral Hypoglycemic AgentsHypothyroidism Morbid obesity  Renal/GU Renal diseaseHydronephrosis. Most recent Cr and K normal.  negative genitourinary   Musculoskeletal negative musculoskeletal ROS (+)   Abdominal (+) + obese,   Peds negative pediatric ROS (+)  Hematology  (+) Blood dyscrasia, anemia , Chronic neutropenia,H/H 08/2015 11/36   Anesthesia Other Findings Her eyelids differ. The left is more open and doesn't close as well. She has had eyelid surgery and she uses Systane before bed.  Reproductive/Obstetrics negative OB ROS                            Anesthesia Physical  Anesthesia Plan  ASA: III  Anesthesia Plan: General   Post-op Pain Management:    Induction: Intravenous  Airway Management Planned: LMA  Additional Equipment:   Intra-op Plan:   Post-operative Plan: Extubation in OR  Informed Consent: I have reviewed the patients History and Physical, chart, labs and discussed the procedure including the risks, benefits and alternatives for the proposed anesthesia with the patient or authorized representative who has indicated  his/her understanding and acceptance.   Dental advisory given  Plan Discussed with: CRNA  Anesthesia Plan Comments: (Has had LMA 4 for similar procedure x 2 in resent past)        Anesthesia Quick Evaluation

## 2015-09-04 ENCOUNTER — Ambulatory Visit: Payer: BLUE CROSS/BLUE SHIELD

## 2015-09-04 ENCOUNTER — Other Ambulatory Visit: Payer: BLUE CROSS/BLUE SHIELD

## 2015-09-04 MED ORDER — GENTAMICIN SULFATE 40 MG/ML IJ SOLN
360.0000 mg | INTRAVENOUS | Status: AC
  Administered 2015-09-05: 360 mg via INTRAVENOUS
  Filled 2015-09-04 (×3): qty 9

## 2015-09-04 NOTE — Progress Notes (Signed)
Will redraw CBC am of surgery. These labs on 08/28/2015 were drawn by PCP

## 2015-09-05 ENCOUNTER — Ambulatory Visit (HOSPITAL_COMMUNITY): Payer: BLUE CROSS/BLUE SHIELD | Admitting: Anesthesiology

## 2015-09-05 ENCOUNTER — Encounter (HOSPITAL_COMMUNITY)
Admission: RE | Disposition: A | Discharge status: Home / Self Care | Payer: Self-pay | Source: Ambulatory Visit | Attending: Urology

## 2015-09-05 ENCOUNTER — Ambulatory Visit (HOSPITAL_COMMUNITY)
Admission: RE | Admit: 2015-09-05 | Discharge: 2015-09-05 | Disposition: A | Discharge status: Home / Self Care | Payer: BLUE CROSS/BLUE SHIELD | Source: Ambulatory Visit | Attending: Urology | Admitting: Urology

## 2015-09-05 ENCOUNTER — Encounter (HOSPITAL_COMMUNITY): Payer: Self-pay | Admitting: *Deleted

## 2015-09-05 DIAGNOSIS — Z9049 Acquired absence of other specified parts of digestive tract: Secondary | ICD-10-CM | POA: Diagnosis not present

## 2015-09-05 DIAGNOSIS — K449 Diaphragmatic hernia without obstruction or gangrene: Secondary | ICD-10-CM | POA: Insufficient documentation

## 2015-09-05 DIAGNOSIS — Z7982 Long term (current) use of aspirin: Secondary | ICD-10-CM | POA: Diagnosis not present

## 2015-09-05 DIAGNOSIS — E119 Type 2 diabetes mellitus without complications: Secondary | ICD-10-CM | POA: Insufficient documentation

## 2015-09-05 DIAGNOSIS — Z9221 Personal history of antineoplastic chemotherapy: Secondary | ICD-10-CM | POA: Insufficient documentation

## 2015-09-05 DIAGNOSIS — Z6841 Body Mass Index (BMI) 40.0 and over, adult: Secondary | ICD-10-CM | POA: Diagnosis not present

## 2015-09-05 DIAGNOSIS — I10 Essential (primary) hypertension: Secondary | ICD-10-CM | POA: Insufficient documentation

## 2015-09-05 DIAGNOSIS — D709 Neutropenia, unspecified: Secondary | ICD-10-CM | POA: Diagnosis not present

## 2015-09-05 DIAGNOSIS — K219 Gastro-esophageal reflux disease without esophagitis: Secondary | ICD-10-CM | POA: Diagnosis not present

## 2015-09-05 DIAGNOSIS — Z923 Personal history of irradiation: Secondary | ICD-10-CM | POA: Insufficient documentation

## 2015-09-05 DIAGNOSIS — Z7984 Long term (current) use of oral hypoglycemic drugs: Secondary | ICD-10-CM | POA: Insufficient documentation

## 2015-09-05 DIAGNOSIS — N131 Hydronephrosis with ureteral stricture, not elsewhere classified: Secondary | ICD-10-CM | POA: Insufficient documentation

## 2015-09-05 DIAGNOSIS — E039 Hypothyroidism, unspecified: Secondary | ICD-10-CM | POA: Insufficient documentation

## 2015-09-05 DIAGNOSIS — D649 Anemia, unspecified: Secondary | ICD-10-CM | POA: Diagnosis not present

## 2015-09-05 DIAGNOSIS — Z79899 Other long term (current) drug therapy: Secondary | ICD-10-CM | POA: Insufficient documentation

## 2015-09-05 DIAGNOSIS — Z8542 Personal history of malignant neoplasm of other parts of uterus: Secondary | ICD-10-CM | POA: Insufficient documentation

## 2015-09-05 HISTORY — PX: CYSTOSCOPY WITH RETROGRADE PYELOGRAM, URETEROSCOPY AND STENT PLACEMENT: SHX5789

## 2015-09-05 LAB — CBC
HEMATOCRIT: 36.7 % (ref 36.0–46.0)
Hemoglobin: 11.4 g/dL — ABNORMAL LOW (ref 12.0–15.0)
MCH: 27.5 pg (ref 26.0–34.0)
MCHC: 31.1 g/dL (ref 30.0–36.0)
MCV: 88.4 fL (ref 78.0–100.0)
PLATELETS: 221 10*3/uL (ref 150–400)
RBC: 4.15 MIL/uL (ref 3.87–5.11)
RDW: 16.1 % — AB (ref 11.5–15.5)
WBC: 7 10*3/uL (ref 4.0–10.5)

## 2015-09-05 LAB — GLUCOSE, CAPILLARY
Glucose-Capillary: 115 mg/dL — ABNORMAL HIGH (ref 65–99)
Glucose-Capillary: 103 mg/dL — ABNORMAL HIGH (ref 65–99)

## 2015-09-05 SURGERY — CYSTOURETEROSCOPY, WITH RETROGRADE PYELOGRAM AND STENT INSERTION
Anesthesia: General | Laterality: Right

## 2015-09-05 MED ORDER — ONDANSETRON HCL 4 MG/2ML IJ SOLN
INTRAMUSCULAR | Status: DC | PRN
  Administered 2015-09-05: 4 mg via INTRAVENOUS

## 2015-09-05 MED ORDER — DEXAMETHASONE SODIUM PHOSPHATE 10 MG/ML IJ SOLN
INTRAMUSCULAR | Status: AC
  Filled 2015-09-05: qty 1

## 2015-09-05 MED ORDER — IOHEXOL 300 MG/ML  SOLN
INTRAMUSCULAR | Status: DC | PRN
  Administered 2015-09-05: 20 mL

## 2015-09-05 MED ORDER — EPHEDRINE SULFATE 50 MG/ML IJ SOLN
INTRAMUSCULAR | Status: AC
  Filled 2015-09-05: qty 1

## 2015-09-05 MED ORDER — PROPOFOL 10 MG/ML IV BOLUS
INTRAVENOUS | Status: AC
  Filled 2015-09-05: qty 40

## 2015-09-05 MED ORDER — FENTANYL CITRATE (PF) 100 MCG/2ML IJ SOLN
INTRAMUSCULAR | Status: AC
  Filled 2015-09-05: qty 2

## 2015-09-05 MED ORDER — FENTANYL CITRATE (PF) 100 MCG/2ML IJ SOLN: 25.0000 ug | INTRAMUSCULAR | Status: DC | PRN

## 2015-09-05 MED ORDER — SODIUM CHLORIDE 0.9 % IR SOLN
Status: DC | PRN
  Administered 2015-09-05: 3000 mL

## 2015-09-05 MED ORDER — FENTANYL CITRATE (PF) 100 MCG/2ML IJ SOLN
INTRAMUSCULAR | Status: DC | PRN
  Administered 2015-09-05 (×2): 50 ug via INTRAVENOUS

## 2015-09-05 MED ORDER — LIDOCAINE HCL (CARDIAC) 20 MG/ML IV SOLN
INTRAVENOUS | Status: AC
  Filled 2015-09-05: qty 5

## 2015-09-05 MED ORDER — PROPOFOL 10 MG/ML IV BOLUS
INTRAVENOUS | Status: DC | PRN
  Administered 2015-09-05: 180 mg via INTRAVENOUS

## 2015-09-05 MED ORDER — MIDAZOLAM HCL 2 MG/2ML IJ SOLN
INTRAMUSCULAR | Status: AC
  Filled 2015-09-05: qty 2

## 2015-09-05 MED ORDER — PHENYLEPHRINE 40 MCG/ML (10ML) SYRINGE FOR IV PUSH (FOR BLOOD PRESSURE SUPPORT)
PREFILLED_SYRINGE | INTRAVENOUS | Status: AC
  Filled 2015-09-05: qty 10

## 2015-09-05 MED ORDER — DEXAMETHASONE SODIUM PHOSPHATE 10 MG/ML IJ SOLN
INTRAMUSCULAR | Status: DC | PRN
  Administered 2015-09-05: 6 mg via INTRAVENOUS

## 2015-09-05 MED ORDER — LACTATED RINGERS IV SOLN
INTRAVENOUS | Status: DC | PRN
  Administered 2015-09-05: 07:00:00 via INTRAVENOUS

## 2015-09-05 MED ORDER — PHENYLEPHRINE HCL 10 MG/ML IJ SOLN
INTRAMUSCULAR | Status: DC | PRN
  Administered 2015-09-05 (×3): 80 ug via INTRAVENOUS

## 2015-09-05 MED ORDER — MIDAZOLAM HCL 5 MG/5ML IJ SOLN
INTRAMUSCULAR | Status: DC | PRN
  Administered 2015-09-05: 2 mg via INTRAVENOUS

## 2015-09-05 MED ORDER — PROMETHAZINE HCL 25 MG/ML IJ SOLN: 6.2500 mg | INTRAMUSCULAR | Status: DC | PRN

## 2015-09-05 MED ORDER — GLYCOPYRROLATE 0.2 MG/ML IJ SOLN
INTRAMUSCULAR | Status: AC
  Filled 2015-09-05: qty 1

## 2015-09-05 MED ORDER — LACTATED RINGERS IV SOLN
INTRAVENOUS | Status: DC
  Administered 2015-09-05: 09:00:00 via INTRAVENOUS

## 2015-09-05 MED ORDER — MEPERIDINE HCL 50 MG/ML IJ SOLN: 6.2500 mg | INTRAMUSCULAR | Status: DC | PRN

## 2015-09-05 MED ORDER — SODIUM CHLORIDE 0.9 % IJ SOLN
INTRAMUSCULAR | Status: AC
  Filled 2015-09-05: qty 10

## 2015-09-05 MED ORDER — OXYCODONE-ACETAMINOPHEN 5-325 MG PO TABS: 1.0000 | ORAL_TABLET | ORAL | Status: DC | PRN

## 2015-09-05 MED ORDER — ONDANSETRON HCL 4 MG/2ML IJ SOLN
INTRAMUSCULAR | Status: AC
  Filled 2015-09-05: qty 2

## 2015-09-05 MED ORDER — LIDOCAINE HCL (CARDIAC) 20 MG/ML IV SOLN
INTRAVENOUS | Status: DC | PRN
  Administered 2015-09-05: 50 mg via INTRAVENOUS

## 2015-09-05 MED ORDER — ROCURONIUM BROMIDE 100 MG/10ML IV SOLN
INTRAVENOUS | Status: AC
  Filled 2015-09-05: qty 1

## 2015-09-05 SURGICAL SUPPLY — 22 items
BASKET LASER NITINOL 1.9FR (BASKET) IMPLANT
BASKET STNLS GEMINI 4WIRE 3FR (BASKET) IMPLANT
BASKET ZERO TIP NITINOL 2.4FR (BASKET) IMPLANT
CATH INTERMIT  6FR 70CM (CATHETERS) ×2 IMPLANT
CLOTH BEACON ORANGE TIMEOUT ST (SAFETY) ×2 IMPLANT
ELECT REM PT RETURN 9FT ADLT (ELECTROSURGICAL)
ELECTRODE REM PT RTRN 9FT ADLT (ELECTROSURGICAL) IMPLANT
FIBER LASER FLEXIVA 1000 (UROLOGICAL SUPPLIES) IMPLANT
FIBER LASER FLEXIVA 200 (UROLOGICAL SUPPLIES) IMPLANT
FIBER LASER FLEXIVA 365 (UROLOGICAL SUPPLIES) IMPLANT
FIBER LASER FLEXIVA 550 (UROLOGICAL SUPPLIES) IMPLANT
FIBER LASER TRAC TIP (UROLOGICAL SUPPLIES) IMPLANT
GLOVE BIOGEL M STRL SZ7.5 (GLOVE) ×2 IMPLANT
GOWN STRL REUS W/TWL XL LVL3 (GOWN DISPOSABLE) ×4 IMPLANT
GUIDEWIRE ANG ZIPWIRE 038X150 (WIRE) ×2 IMPLANT
GUIDEWIRE STR DUAL SENSOR (WIRE) IMPLANT
IV NS IRRIG 3000ML ARTHROMATIC (IV SOLUTION) IMPLANT
PACK CYSTO (CUSTOM PROCEDURE TRAY) ×2 IMPLANT
STENT POLARIS 5FRX24 (STENTS) ×2 IMPLANT
SYRINGE 10CC LL (SYRINGE) IMPLANT
SYRINGE IRR TOOMEY STRL 70CC (SYRINGE) IMPLANT
TUBE FEEDING 8FR 16IN STR KANG (MISCELLANEOUS) ×2 IMPLANT

## 2015-09-05 NOTE — Anesthesia Postprocedure Evaluation (Signed)
Anesthesia Post Note  Patient: Financial planner  Procedure(s) Performed: Procedure(s) (LRB): CYSTOSCOPY WITH RETROGRADE PYELOGRAM,  AND STENT PLACEMENT (Right)  Patient location during evaluation: PACU Anesthesia Type: General Level of consciousness: awake and alert Pain management: pain level controlled Vital Signs Assessment: post-procedure vital signs reviewed and stable Respiratory status: spontaneous breathing, nonlabored ventilation, respiratory function stable and patient connected to nasal cannula oxygen Cardiovascular status: blood pressure returned to baseline and stable Postop Assessment: no signs of nausea or vomiting Anesthetic complications: no    Last Vitals:  Filed Vitals:   09/05/15 0830 09/05/15 0845  BP: 128/65 104/51  Pulse: 76 77  Temp: 36.6 C 36.4 C  Resp: 14 15    Last Pain:  Filed Vitals:   09/05/15 0858  PainSc: 2                  Jade Burright

## 2015-09-05 NOTE — Discharge Instructions (Signed)
1 - You may have urinary urgency (bladder spasms) and bloody urine on / off with stent in place. This is normal.  2 - Call MD or go to ER for fever >102, severe pain / nausea / vomiting not relieved by medications, or acute change in medical status General Anesthesia, Adult, Care After Refer to this sheet in the next few weeks. These instructions provide you with information on caring for yourself after your procedure. Your health care provider may also give you more specific instructions. Your treatment has been planned according to current medical practices, but problems sometimes occur. Call your health care provider if you have any problems or questions after your procedure. WHAT TO EXPECT AFTER THE PROCEDURE After the procedure, it is typical to experience:  Sleepiness.  Nausea and vomiting. HOME CARE INSTRUCTIONS  For the first 24 hours after general anesthesia:  Have a responsible person with you.  Do not drive a car. If you are alone, do not take public transportation.  Do not drink alcohol.  Do not take medicine that has not been prescribed by your health care provider.  Do not sign important papers or make important decisions.  You may resume a normal diet and activities as directed by your health care provider.  Change bandages (dressings) as directed.  If you have questions or problems that seem related to general anesthesia, call the hospital and ask for the anesthetist or anesthesiologist on call. SEEK MEDICAL CARE IF:  You have nausea and vomiting that continue the day after anesthesia.  You develop a rash. SEEK IMMEDIATE MEDICAL CARE IF:   You have difficulty breathing.  You have chest pain.  You have any allergic problems.   This information is not intended to replace advice given to you by your health care provider. Make sure you discuss any questions you have with your health care provider.   Document Released: 10/25/2000 Document Revised: 08/09/2014  Document Reviewed: 11/17/2011 Elsevier Interactive Patient Education [employer].

## 2015-09-05 NOTE — Brief Op Note (Signed)
09/05/2015  7:49 AM  PATIENT:  [name]  60 y.o. female  PRE-OPERATIVE DIAGNOSIS:  CHRONIC PARTIAL RIGHT URETERAL STRICTURE  POST-OPERATIVE DIAGNOSIS:  CHRONIC PARTIAL RIGHT URETERAL STRICTURE  PROCEDURE:  Procedure(s): CYSTOSCOPY WITH RETROGRADE PYELOGRAM,  AND STENT PLACEMENT (Right)  SURGEON:  Surgeon(s) and Role:    * Alexis Frock, MD - Primary  PHYSICIAN ASSISTANT:   ASSISTANTS: none   ANESTHESIA:   general  EBL:     BLOOD ADMINISTERED:none  DRAINS: none   LOCAL MEDICATIONS USED:  NONE  SPECIMEN:  No Specimen  DISPOSITION OF SPECIMEN:  N/A  COUNTS:  YES  TOURNIQUET:  * No tourniquets in log *  DICTATION: .Other Dictation: Dictation Number [id number]  PLAN OF CARE: Discharge to home after PACU  PATIENT DISPOSITION:  PACU - hemodynamically stable.   Delay start of Pharmacological VTE agent (>24hrs) due to surgical blood loss or risk of bleeding: yes

## 2015-09-05 NOTE — H&P (Signed)
[name] is an 60 y.o. female.    Chief Complaint: Pre-op Right Ureteral Stent Placement  HPI:    1 - Bladder Injury - s/p cystotomy repair and bilateral JJ stent placement by GYN oncology team 11/2014 at time of pelvic exenteration / end colostomy for metastatic GYN cancer. F/u imaging with CT 12/30/14 w/o bladder leak and stents in good position. Most recent Cr normal.  2 - Metastatic Endometrial Cancer - s/p repeat resection [phone] with colon and vaginal involvment but negative nodes / margins. Had adjuvant chemo-XRT which she is now done with. Follows with Dr. Alvy [name].   3 - Right Ureteral Stricture - New Rt hydro by CT 03/2015 to distal ureter on eval flank pain.   Recent Course: 03/2015 - Operative cysto / ureteroscopy c/w likley 2-3cm stricture (distal end about 3cm proximal to UO, balloon to 44F, 8x24 JJ stent placed)  04/2015  - Ureteroscopy patent ureter, JJ stent removed 05/2015 - CT Rt hydro to distal ureter (approx stable) / no flank pain.  08/2015 - CT Rt hydro to distal ureter (mildly increased)  no flank pain, Cr 1.2.  PMH sig for DM2, morbid obesity. Her PCP is [name].  Today "[name]" is seen to proceed with re-stenting of her right ureter. She remains asymptomatic. Most recent UA withour infectious parameters.   Past Medical History  Diagnosis Date  . Benign essential HTN 12/14/2011  . Multiple thyroid nodules     Managed by Dr. Harlow [name]  . GERD (gastroesophageal reflux disease)   . Depression   . Dysuria   . Radiation-induced dermatitis     contact dermatitis , radiation completed, rash only on ankles now.  . History of gastric polyp     2014  duodenum  . History of radiation therapy     01-29-2015 to 03-10-2015  pelvis 50.4Gy  . Hypothyroidism   . DM type 2 (diabetes mellitus, type 2) ([place])   . Hiatal hernia   . Anemia in neoplastic disease   . Chronic idiopathic neutropenia (HCC)     presumed related to chemotherapy March 2006--- followed by dr  [name]   . Ureteral stricture, right   . S/P colostomy ([place])   . Hydronephrosis   . History of renal stent   . Neutropenia ([place])   . Cancer of corpus uteri, except isthmus [facility]) oncologist-- dr [name] and dr [name]     Mar 2006 dx endometrial and ovarian cancer s/p  chemotheapy and surgery    Past Surgical History  Procedure Laterality Date  . Appendectomy    . Tonsillectomy    . Eye surgery      pytosis of eyelids-child  . Colonoscopy with propofol N/A 08/21/2013    Procedure: COLONOSCOPY WITH PROPOFOL;  Surgeon: [name] Nipper, MD;  Location: [facility];  Service: Endoscopy;  Laterality: N/A;  . Eus N/A 10/02/2014    Procedure: LOWER ENDOSCOPIC ULTRASOUND (EUS);  Surgeon: [name] Silence, MD;  Location: [facility];  Service: Endoscopy;  Laterality: N/A;  . Robotic assisted lap vaginal hysterectomy N/A 11/19/2014    Procedure: ROBOTIC LYSIS OF ADHESIONS, CONVERTED TO LAPAROTOMY RADICAL UPPER VAGINECTOMY,LOW ANTERIOR BOWEL RESECTION, COLOSTOMY, BILATERAL URETERAL STENT PLACEMENT AND CYSTONOMY CLOSURE;  Surgeon: [name] Amber, MD;  Location: [facility];  Service: Gynecology;  Laterality: N/A;  . Ostomy N/A 11/19/2014    Procedure: OSTOMY;  Surgeon: [name] [name], MD;  Location: [facility];  Service: General;  Laterality: N/A;  . Colostomy takedown N/A 12/04/2014    Procedure: LAPROSCOPIC LYSIS  OF ADHESIONS, SPLENIC MOBILIZATION, RELOCATION OF COLOSTOMY, DEBRIDEMENT INITIAL COLOSTOMY SITE;  Surgeon: [name] [name], MD;  Location: [facility];  Service: General;  Laterality: N/A;  . Cystoscopy with retrograde pyelogram, ureteroscopy and stent placement Right 03/20/2015    Procedure: CYSTOSCOPY WITH RETROGRADE PYELOGRAM, URETEROSCOPY WITH BALLOON DILATION AND STENT PLACEMENT ON RIGHT;  Surgeon: [name] Frock, MD;  Location: [facility];  Service: Urology;  Laterality: Right;  . Excision soft tissue mass right foreman  12-08-2006  . Abdominal hysterectomy  2006    staging for Ovarian cancer  .  Laparoscopic cholecystectomy    . Cystoscopy with retrograde pyelogram, ureteroscopy and stent placement Right 05/02/2015    Procedure: CYSTOSCOPY WITH RIGHT RETROGRADE PYELOGRAM,  DIAGNOSTIC URETEROSCOPY AND STENT PULL ;  Surgeon: [name] Frock, MD;  Location: [facility];  Service: Urology;  Laterality: Right;    Family History  Problem Relation Age of Onset  . Cancer Mother     stomach ca  . Hypertension Mother   . Cancer Father     prostate ca  . Diabetes Father   . Heart disease Father     CABG  . Diabetes Sister   . Hypertension Brother y-10  . Heart disease Brother     CABG  . Diabetes Brother    Social History:  reports that she has never smoked. She has never used smokeless tobacco. She reports that she drinks alcohol. She reports that she does not use illicit drugs.  Allergies:  Allergies  Allergen Reactions  . Penicillins Swelling    Facial swelling Has patient had a PCN reaction causing immediate rash, facial/tongue/throat swelling, SOB or lightheadedness with hypotension: Yes Has patient had a PCN reaction causing severe rash involving mucus membranes or skin necrosis:  Has patient had a PCN reaction that required hospitalization No Has patient had a PCN reaction occurring within the last 10 years: No If all of the above answers are "NO", then may proceed with Cephalosporin use.   [name] Kitchen Ultram [Tramadol] Hives  . Adhesive [Tape]     blisters  . Cefaclor Rash    Ceclor  . Erythromycin     Gastritis, abd cramps  . Trimethoprim Rash  . Pectin Rash    Pectin ring for stoma  . Sulfa Antibiotics Rash    Medications Prior to Admission  Medication Sig Dispense Refill  . aspirin 81 MG tablet Take 81 mg by mouth at bedtime. Reported on 08/27/2015    . Biotin 5 MG TABS Take 1 tablet by mouth every morning.     . Canagliflozin (INVOKANA) 300 MG TABS Take 1 tablet by mouth every morning.     . Cholecalciferol (VITAMIN D3) 10000 UNITS capsule Take 10,000  Units by mouth once a week. Sundays    . diphenhydrAMINE (BENADRYL) 25 MG tablet Take 25 mg by mouth every 4 (four) hours as needed for itching or allergies.     . filgrastim (NEUPOGEN) 480 MCG/1.6ML injection Inject 1.6 mLs (480 mcg total) into the skin once a week. (Patient taking differently: Inject 480 mcg into the skin once a week. On thursdays) 19.2 mL 0  . fluticasone (FLOVENT HFA) 110 MCG/ACT inhaler Inhale 1 puff into the lungs as needed (SOB and Wheezing). Reported on 08/27/2015    . hyaluronate sodium (RADIAPLEXRX) GEL Apply 1 application topically 2 (two) times daily as needed (scars).     [name] Kitchen levothyroxine (SYNTHROID) 175 MCG tablet Take 175 mcg by mouth daily before breakfast.    .  loratadine (CLARITIN) 10 MG tablet Take 10 mg by mouth daily.    . metFORMIN (GLUCOPHAGE) 1000 MG tablet Take 1,000 mg by mouth 2 (two) times daily with a meal.     . Multiple Vitamin (MULTIVITAMIN WITH MINERALS) TABS tablet Take 1 tablet by mouth daily.    [name] Kitchen omega-3 acid ethyl esters (LOVAZA) 1 G capsule Take 1 g by mouth 2 (two) times daily.    [name] Kitchen omeprazole (PRILOSEC) 20 MG capsule Take 20 mg by mouth at bedtime.     Vladimir Faster Glycol-Propyl Glycol (SYSTANE OP) Apply 1 drop to eye at bedtime.     . rosuvastatin (CRESTOR) 10 MG tablet Take 10 mg by mouth every evening.     . sitaGLIPtin (JANUVIA) 100 MG tablet Take 100 mg by mouth daily.    . valsartan (DIOVAN) 320 MG tablet Take 320 mg by mouth every morning.       Results for orders placed or performed during the hospital encounter of 09/05/15 (from the past 48 hour(s))  Glucose, capillary     Status: Abnormal   Collection Time: 09/05/15  5:35 AM  Result Value Ref Range   Glucose-Capillary 115 (H) 65 - 99 mg/dL   Comment 1 Notify RN    No results found.  Review of Systems  Constitutional: Negative.  Negative for fever.  HENT: Negative.   Eyes: Negative.   Respiratory: Negative.   Cardiovascular: Negative.   Gastrointestinal: Negative.    Genitourinary: Negative for hematuria and flank pain.  Musculoskeletal: Negative.   Skin: Negative.   Neurological: Negative.   Endo/Heme/Allergies: Negative.   Psychiatric/Behavioral: Negative.     Blood pressure 122/55, pulse 81, temperature 98.7 F (37.1 C), temperature source Oral, resp. rate 16, height 5' 2.5" (1.588 m), weight 106.255 kg (234 lb 4 oz), SpO2 98 %. Physical Exam  Constitutional: She appears well-developed.  HENT:  Head: Normocephalic.  Eyes: Pupils are equal, round, and reactive to light.  Neck: Normal range of motion.  Cardiovascular: Normal rate.   Respiratory: Effort normal.  GI: Soft.  Genitourinary:  No CVAT  Musculoskeletal: Normal range of motion.  Neurological: She is alert.  Skin: Skin is warm.  Psychiatric: She has a normal mood and affect. Her behavior is normal. Judgment and thought content normal.     Assessment/Plan  1 - Bladder Injury - most recent imaging and cysto suggests durable repair.   2 - Metastatic Endometrial Cancer - per GYN oncology, NED.   3 - Right Ureteral Stricture - most recent imaging with approx stable / mildly increased hydro. Suspect some element of chronic stricture recurrence. Discussed option of chronic stenting (changed approx Q3-38mos) vv. surveillance with consideration of chronic stenting for GFR decline / progressive hydro / symptoms. This is balance of quality of live v. maximal renal preservation. At this time she wants to try stent again.   Proceed today as planned.    [name] Frock, MD 09/05/2015, 5:46 AM

## 2015-09-05 NOTE — Transfer of Care (Signed)
Immediate Anesthesia Transfer of Care Note  Patient: [name]  Procedure(s) Performed: Procedure(s): CYSTOSCOPY WITH RETROGRADE PYELOGRAM,  AND STENT PLACEMENT (Right)  Patient Location: PACU  Anesthesia Type:General  Level of Consciousness:  sedated, patient cooperative and responds to stimulation  Airway & Oxygen Therapy:Patient Spontanous Breathing and Patient connected to face mask oxgen  Post-op Assessment:  Report given to PACU RN and Post -op Vital signs reviewed and stable  Post vital signs:  Reviewed and stable  Last Vitals:  Filed Vitals:   09/05/15 0531  BP: 122/55  Pulse: 81  Temp: 37.1 C  Resp: 16    Complications: No apparent anesthesia complications

## 2015-09-06 LAB — HEMOGLOBIN A1C
Hgb A1c MFr Bld: 5.8 % — ABNORMAL HIGH (ref 4.8–5.6)
MEAN PLASMA GLUCOSE: 120 mg/dL

## 2015-09-06 NOTE — Op Note (Signed)
[name], [name] NO.:  [id number]  MEDICAL RECORD NO.:  [id number]  LOCATION:  [facility]                         FACILITY:  [facility]  PHYSICIAN:  Alexis Frock, MD     DATE OF BIRTH:  1955-12-05  DATE OF PROCEDURE: 09/05/2015                               OPERATIVE REPORT  DIAGNOSIS:  Chronic recurrent right ureteral stricture, history of metastatic gynecologic cancer, status post surgery and radiation.  PROCEDURE: 1. Cystoscopy with right retrograde pyelogram interpretation. 2. Right ureteral stent placement 5 x 24 Polaris, no tether.  ESTIMATED BLOOD LOSS:  Nil.  COMPLICATIONS:  None.  SPECIMEN:  None.  FINDINGS: 1. Mild-to-moderate right hydroureteronephrosis to the level of the     iliacs with recurrence, approximately 2-3 cm distal ureteral     stricture by retrograde pyelography. 2. Successful placement of right ureteral stent, proximal in renal     pelvis and distal in urinary bladder. 3. Somewhat scarified bladder with ureteral orifices abnormally and     medially displaced consistent with prior cystotomy repair.     Otherwise, unremarkable.  INDICATION:  [name] is a very pleasant, but unfortunate 60 year old lady with history of morbid obesity, as well as advanced gynecologic cancer, status post surgery times several and chemoradiation.  She has had a problematic right ureteral stricture for some time.  She has undergone endoscopic evaluation including attempted ureteroscopic balloon dilation, but her stricture has been refractory to this.  She has been relatively asymptomatic from her hydro without flank pain and her overall kidney function is normal.  However, we have discussed the pros and cons of renal decompression with stenting versus nephrostomy in the situation at length before and she now wishes to proceed with replacement of right ureteral stent with goal of maximal renal preservation.  We have also extensively discussed the  possibility of eventual reconstruction, but that this would not be appropriate until she is at least 2 years cancer free.  Informed consent was signed and placed in medical record.  PROCEDURE IN DETAIL:  The patient being [name], was verified. Procedure being cysto and right stent placement was confirmed. Procedure was carried out.  Time-out was performed.  Intravenous antibiotics were administered.  General anesthesia introduced.  The patient was placed into a low lithotomy position.  Sterile field was created by prepping and draping the patient's vagina, introitus, and proximal thighs using iodine x3.  Next, cystourethroscopy was performed using a 23-French rigid cystoscope with 30-degree offset lens. Inspection of bladder revealed no diverticula, calcifications, or papular lesions.  Ureteral orifices were both somewhat medially deviated with a small area of prior scar seen in the inter-trigone anterior ureteral area consistent with known prior cystotomy repair.  Otherwise, unremarkable.  The right ureteral orifice was cannulated with a 6-French end-hole catheter and right retrograde pyelogram was obtained.  Right retrograde pyelogram demonstrated a single right ureter with single system right kidney.  There was an approximately 2-3 cm narrowing in the distal ureter very close to the area of the iliacs and below consistent with known stricture.  A 0.038 ZIPwire was advanced at the level of renal pelvis over which the open-ended catheter was advanced and  further retrograde pyelography revealed mild-to-moderate hydronephrosis down to begin the area of the distal ureter where known stricture is in place.  As the goal today, it was stenting alone.  A 5 x 24 Polaris-type stent was placed over the working wire, and deployed with the proximal in renal pelvis and distal in urinary bladder, taking great care to avoid the passage across the midline of the bladder to avoid irritative  symptoms and this was deployed successfully.  The bladder was emptied per cystoscope.  Radiographic photo documentation performed and procedure was terminated.  The patient tolerated the procedure well with no immediate periprocedural complications.  The patient was taken to the Postanesthesia Care Unit in a stable condition.          ______________________________ Alexis Frock, MD      TM/MEDQ  D:  09/05/2015  T:  09/05/2015  Job:  [id number]

## 2015-09-11 ENCOUNTER — Other Ambulatory Visit: Payer: BLUE CROSS/BLUE SHIELD

## 2015-09-11 ENCOUNTER — Ambulatory Visit: Payer: BLUE CROSS/BLUE SHIELD

## 2015-09-15 ENCOUNTER — Ambulatory Visit: Payer: BLUE CROSS/BLUE SHIELD | Attending: Gynecologic Oncology | Admitting: Gynecologic Oncology

## 2015-09-15 ENCOUNTER — Encounter: Payer: Self-pay | Admitting: Hematology and Oncology

## 2015-09-15 ENCOUNTER — Encounter (INDEPENDENT_AMBULATORY_CARE_PROVIDER_SITE_OTHER): Payer: BLUE CROSS/BLUE SHIELD | Admitting: Family Medicine

## 2015-09-15 ENCOUNTER — Encounter: Payer: Self-pay | Admitting: Gynecologic Oncology

## 2015-09-15 VITALS — BP 110/60 | HR 66 | Temp 97.7°F | Resp 19 | Wt 233.8 lb

## 2015-09-15 DIAGNOSIS — Z7984 Long term (current) use of oral hypoglycemic drugs: Secondary | ICD-10-CM | POA: Insufficient documentation

## 2015-09-15 DIAGNOSIS — D709 Neutropenia, unspecified: Secondary | ICD-10-CM | POA: Insufficient documentation

## 2015-09-15 DIAGNOSIS — E041 Nontoxic single thyroid nodule: Secondary | ICD-10-CM | POA: Diagnosis not present

## 2015-09-15 DIAGNOSIS — N135 Crossing vessel and stricture of ureter without hydronephrosis: Secondary | ICD-10-CM | POA: Diagnosis not present

## 2015-09-15 DIAGNOSIS — I10 Essential (primary) hypertension: Secondary | ICD-10-CM | POA: Diagnosis not present

## 2015-09-15 DIAGNOSIS — Z7982 Long term (current) use of aspirin: Secondary | ICD-10-CM | POA: Insufficient documentation

## 2015-09-15 DIAGNOSIS — L03311 Cellulitis of abdominal wall: Secondary | ICD-10-CM | POA: Insufficient documentation

## 2015-09-15 DIAGNOSIS — L59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: Secondary | ICD-10-CM | POA: Diagnosis not present

## 2015-09-15 DIAGNOSIS — Z933 Colostomy status: Secondary | ICD-10-CM | POA: Insufficient documentation

## 2015-09-15 DIAGNOSIS — D708 Other neutropenia: Secondary | ICD-10-CM

## 2015-09-15 DIAGNOSIS — E119 Type 2 diabetes mellitus without complications: Secondary | ICD-10-CM | POA: Insufficient documentation

## 2015-09-15 DIAGNOSIS — K219 Gastro-esophageal reflux disease without esophagitis: Secondary | ICD-10-CM | POA: Insufficient documentation

## 2015-09-15 DIAGNOSIS — C541 Malignant neoplasm of endometrium: Secondary | ICD-10-CM

## 2015-09-15 DIAGNOSIS — K9422 Gastrostomy infection: Secondary | ICD-10-CM | POA: Insufficient documentation

## 2015-09-15 DIAGNOSIS — Z923 Personal history of irradiation: Secondary | ICD-10-CM | POA: Insufficient documentation

## 2015-09-15 DIAGNOSIS — N134 Hydroureter: Secondary | ICD-10-CM

## 2015-09-15 DIAGNOSIS — Z23 Encounter for immunization: Secondary | ICD-10-CM

## 2015-09-15 DIAGNOSIS — N9912 Postprocedural urethral stricture, female: Secondary | ICD-10-CM | POA: Diagnosis not present

## 2015-09-15 DIAGNOSIS — K449 Diaphragmatic hernia without obstruction or gangrene: Secondary | ICD-10-CM | POA: Insufficient documentation

## 2015-09-15 DIAGNOSIS — Z9221 Personal history of antineoplastic chemotherapy: Secondary | ICD-10-CM | POA: Diagnosis not present

## 2015-09-15 DIAGNOSIS — C569 Malignant neoplasm of unspecified ovary: Secondary | ICD-10-CM | POA: Diagnosis present

## 2015-09-15 NOTE — Patient Instructions (Signed)
Plan to follow up in May 2017 or sooner if needed.  Please call for any questions or concerns.

## 2015-09-15 NOTE — Progress Notes (Signed)
Recurrent Endometrial cancer FOLLOWUP VISIT  Assessment:    60 y.o. year old with recurrent endometrioid endometrial/ovarian cancer.   S/p exploratory laparotomy, posterior supralevator pelvic exenteration, end colostomy, bladder repair, ureteral stenting with complete resection and negative margins on 11/09/14. 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 S/p hospital admission 12/20/14 for urosepsis  S/p adjuvant radiation completed 03/10/15  Postoperative and post-radiation right hydroureter and distal ureteral obstruction.  No convincing evidence for recurrence/persistence of tumor on imaging. NED.  Plan: 1) Recurrent endometrial/ovarian cancer: s/p complete resection with negative margins and s/p adjuvant radiation. No chemotherapy due to chronic idiopathic neutropenia.   CT imaging post treatment (January, 2017) shows stable thickening of upper vagina and right peri-urethral tissues. Given that there was no macroscopic disease at the completion of surgery and on pre-radiation imaging, I believe this is most likely radiation changes and postop changes. Recommend repeat CT in August 2017.  2) Right hydroureter and distal ureteral obstruction - secondary to postoperative scaring and post-radiation changes. Appreciate Dr Zettie Pho assistance with dilation and stenting.  3) neutropenia - Dr Alvy Bimler is managing this and we appreciate.  4) Follow-up: I will see Imagene Gurney for followup in May, 2017.  HPI:  [name] is a 60 y.o. 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  She has a history of endometrial and ovarian endometrioid carcinoma treated in 2006 by Dr. [name] at [facility] in [place]. Her surgery (TAH, BSO) was followed by adjuvant chemotherapy with carboplatin plus paclitaxel due to the ovarian involvement and the presence  of a cul de sac lesion also positive for disease. She denies receiving adjuvant radiation therapy. She is unclear if she had metastatic endometrial cancer to the ovary or duel primaries. She had a complete response to therapy however developed leukopenia in July 2010. 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 She sees Dr. Alvy Bimler for this and is treated with G-CSF injections.  She began experiencing rectal pain approximately in January 2016. She also reports narrowing of caliber of the stool. She denies hematochezia. She does report approximately 3 months of vaginal spotting. She's had no specific follow-up for her gynecologic cancers in the past 4 years.  As part of workup of her rectal pain she underwent a CT scan of the abdomen and pelvis on 09/12/2014. This demonstrated a new right perirectal mass abutting the vaginal cuff measuring 3.8 x 4.9 cm. There is a limited fat plane between the mass and the rectum posteriorly. Rectal invasion could not be excluded. There were no other masses identified in the abdomen and pelvis or lymphadenopathy. There is no other evidence of metastatic disease or recurrent disease. There was no hydronephrosis. A CA-125 drawn on 09/12/2014 was normal at 10.  PET was negative for extrapelvic disease. MRI defined the lesion as a 3.5x5cm lesion to the right of the rectum at the vaginal cuff.  Colonoscopy was performed on 10/02/14 with transrectal [place] and biopsy and this revealed endometrioid adenocarcinoma. Of note, the lesion was not seen within the lumen of the rectum.   She then underwent a posterior supralevator exenteration with colostomy and bladder repair and stent placement on 6/60/63 without complications.  Her postoperative course was uncomplicate with the exception of development of postop anemia.  Her final pathology revealed endometrioid adenocarcinoma  invading the vagina  and rectum with negative margins on the specimen. The margin had been close (clinically) to the right pelvic sidewall which was marked with surgical clips.  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 It was treated with IV antibiotics and then on POD 15 she was taken to the OR for laparoscopic revision of the stoma with Dr Michael [name]. Postoperatively she had wound vac and packing for her stomal wound.  A retrograde cystogram on week 4 postop confirmed an intact bladder and the foley was removed.  She initially had some voiding issues with decreased sensation to void. We tested a post void residual in May, 2016 and this revealed adequate voiding.  On 12/30/14 she was admitted to [facility] with sepsis associated with Enterobacter Cloacae UTI. This was treated with IV antibiotics and then a prolonged course of oral cipro. Imaging performed at the time of admission (a CT of the abdo/pelvis) revealed: Bilateral double-J internal ureteral stents in adequate position with persistent mild bilateral hydronephrosis   She complete radiation therapy from 01/29/15 to 03/10/15 with 50Gy of external beam radiation and IMRT. She tolerated therapy well with minor skin irritation.  She had her ureteral stents removed in June, 2016, however, then developed right ureteral obstruction, hydroureter and pain. She went to the OR with Dr Tresa Moore on 03/20/15 for ureteral dilation and right stent placement (the left was draining well). No tumor was seen on cysto.   Right ureteral stent now out. No vaginal bleeding. Stoma working well. Gaining weight. Mood improved.   CT abdo/pelvis on 05/14/15 showed: no new lesions, stable thickening in right pelvis/distal right ureter consistent with radiation effect.  She has had bronchitis symptoms for >63month s/p levaquin trial.  Interval Hx:  The patient's CT in January 2017 showed progression  of her right ureteral obstruction after stent removal. Dr MTresa Mooretook her to the OR on 09/05/15 for a cystoscopy, retrograde pyelogram and right ureteral stent placement.   The CT on 08/18/15 showed decreased attenuation of the right renal parenchyma, right hydronephrosis that was severe no excretion of contrast on that for graphic phase imaging. The perivascular soft tissue thickening in the pelvis and presacral regions grossly stable consistent with radiation changes.  Review of systems: Constitutional:  She has no weight gain or weight loss. She has a low grade fever no chills. Eyes: No blurred vision Ears, Nose, Mouth, Throat: No dizziness, headaches or changes in hearing. No mouth sores. Cardiovascular: No chest pain, palpitations or edema. Respiratory:  No shortness of breath, wheezing  Gastrointestinal: She has normal bowel movements trhough stoma without diarrhea or constipation. She denies any nausea or vomiting. She denies blood in her stool or heart burn. Genitourinary:  See HPI Musculoskeletal: Denies muscle weakness or joint pains.  Skin:  She has no skin changes, rashes or itching Neurological:  Denies dizziness or headaches. No neuropathy, no numbness or tingling. Psychiatric:  She denies depression or anxiety. Hematologic/Lymphatic:   No easy bruising or bleeding  Allergies  Allergen Reactions  . Penicillins Swelling    Facial swelling Has patient had a PCN reaction causing immediate rash, facial/tongue/throat swelling, SOB or lightheadedness with hypotension: Yes Has patient had a PCN reaction causing severe rash involving mucus membranes or skin necrosis:  Has patient had a PCN reaction that required hospitalization No Has patient had a PCN reaction occurring within the last 10 years: No If all of the above answers are "NO",  then may proceed with Cephalosporin use.   [name] Kitchen Ultram [Tramadol] Hives  . Adhesive [Tape]     blisters  . Cefaclor Rash    Ceclor  . Erythromycin      Gastritis, abd cramps  . Trimethoprim Rash  . Pectin Rash    Pectin ring for stoma  . Sulfa Antibiotics Rash    Current Outpatient Prescriptions on File Prior to Visit  Medication Sig Dispense Refill  . Biotin 5 MG TABS Take 1 tablet by mouth every morning.     . Canagliflozin (INVOKANA) 300 MG TABS Take 1 tablet by mouth every morning.     . Cholecalciferol (VITAMIN D3) 10000 UNITS capsule Take 10,000 Units by mouth once a week. _0 -29-2016 to 03-10-2015  pelvis 50.4Gy  . Hypothyroidism   . DM type 2 (diabetes mellitus, type 2) ([place])   . Hiatal hernia   . Anemia in neoplastic disease   . Chronic idiopathic neutropenia (HCC)  presumed related to chemotherapy March 2006--- followed by dr Alvy Bimler   . Ureteral stricture, right   . S/P colostomy ([place])   . Hydronephrosis   . History of renal stent   . Neutropenia ([place])   . Cancer of corpus uteri, except isthmus [facility]) oncologist-- dr Denman [name] and dr Alvy Bimler     Mar 2006 dx endometrial and ovarian cancer s/p  chemotheapy and surgery    Past Surgical History  Procedure Laterality Date  . Appendectomy    . Tonsillectomy    . Eye surgery      pytosis of eyelids-child  . Colonoscopy with propofol N/A 08/21/2013    Procedure: COLONOSCOPY WITH PROPOFOL;  Surgeon: Cleotis Nipper, MD;  Location: [facility];  Service: Endoscopy;  Laterality: N/A;  . Eus N/A 10/02/2014    Procedure: LOWER ENDOSCOPIC ULTRASOUND (EUS);  Surgeon: Arta Silence, MD;  Location: [facility];  Service: Endoscopy;  Laterality: N/A;  . Robotic assisted lap vaginal hysterectomy N/A 11/19/2014    Procedure: ROBOTIC LYSIS OF ADHESIONS, CONVERTED TO LAPAROTOMY RADICAL UPPER VAGINECTOMY,LOW ANTERIOR BOWEL RESECTION,  COLOSTOMY, BILATERAL URETERAL STENT PLACEMENT AND CYSTONOMY CLOSURE;  Surgeon: Everitt Amber, MD;  Location: [facility];  Service: Gynecology;  Laterality: N/A;  . Ostomy N/A 11/19/2014    Procedure: OSTOMY;  Surgeon: Michael [name], MD;  Location: [facility];  Service: General;  Laterality: N/A;  . Colostomy takedown N/A 12/04/2014    Procedure: LAPROSCOPIC LYSIS OF ADHESIONS, SPLENIC MOBILIZATION, RELOCATION OF COLOSTOMY, DEBRIDEMENT INITIAL COLOSTOMY SITE;  Surgeon: Michael [name], MD;  Location: [facility];  Service: General;  Laterality: N/A;  . Cystoscopy with retrograde pyelogram, ureteroscopy and stent placement Right 03/20/2015    Procedure: CYSTOSCOPY WITH RETROGRADE PYELOGRAM, URETEROSCOPY WITH BALLOON DILATION AND STENT PLACEMENT ON RIGHT;  Surgeon: Alexis Frock, MD;  [facility];  Service: Urology;  Laterality: Right;  . Excision soft tissue mass right foreman  12-08-2006  . Abdominal hysterectomy  2006    staging for Ovarian cancer  . Laparoscopic cholecystectomy    . Cystoscopy with retrograde pyelogram, ureteroscopy and stent placement Right 05/02/2015    Procedure: CYSTOSCOPY WITH RIGHT RETROGRADE PYELOGRAM,  DIAGNOSTIC URETEROSCOPY AND STENT PULL ;  Surgeon: Alexis Frock, MD;  Location: [facility];  Service: Urology;  Laterality: Right;  . Cystoscopy with retrograde pyelogram, ureteroscopy and stent placement Right 09/05/2015    Procedure: CYSTOSCOPY WITH RETROGRADE PYELOGRAM,  AND STENT PLACEMENT;  Surgeon: Alexis Frock, MD;  Location: [facility];  Service: Urology;  Laterality: Right;    Family History  Problem Relation Age of Onset  . Cancer Mother     stomach ca  . Hypertension Mother   . Cancer Father     prostate ca  . Diabetes Father   . Heart disease Father     CABG  . Diabetes Sister   . Hypertension Brother y-10  . Heart disease Brother     CABG  . Diabetes Brother     Social History   Social History  . Marital Status: Married     Spouse Name: N/A  . Number of Children: 1  . Years of Education: N/A   Occupational History  . retired Therapist, sports from [place] History Main Topics  . Smoking status: Never Smoker   . Smokeless tobacco: Never Used  . Alcohol Use: Yes     Comment: rare social  . Drug Use: No  . Sexual Activity: Not Currently   Other Topics Concern  .  Not on file   Social History Narrative   Exercise-- has not gotten back into it since cancer came back   CBC    Component Value Date/Time   WBC 7.0 09/05/2015 0600   WBC 3.8* 08/18/2015 1004   WBC 2.4* 08/25/2014 1541   RBC 4.15 09/05/2015 0600   RBC 4.34 08/18/2015 1004   RBC 3.68* 12/13/2014 1235   RBC 4.75 08/25/2014 1541   HGB 11.4* 09/05/2015 0600   HGB 11.9 08/18/2015 1004   HGB 13.0 08/25/2014 1541   HCT 36.7 09/05/2015 0600   HCT 37.4 08/18/2015 1004   HCT 41.3 08/25/2014 1541   PLT 221 09/05/2015 0600   PLT 215 08/18/2015 1004   MCV 88.4 09/05/2015 0600   MCV 86.2 08/18/2015 1004   MCV 86.8 08/25/2014 1541   MCH 27.5 09/05/2015 0600   MCH 27.4 08/18/2015 1004   MCH 27.5 08/25/2014 1541   MCHC 31.1 09/05/2015 0600   MCHC 31.8 08/18/2015 1004   MCHC 31.6* 08/25/2014 1541   RDW 16.1* 09/05/2015 0600   RDW 15.8* 08/18/2015 1004   LYMPHSABS 0.5* 08/28/2015 1617   LYMPHSABS 0.6* 08/18/2015 1004   MONOABS 0.5 08/28/2015 1617   MONOABS 0.5 08/18/2015 1004   EOSABS 0.2 08/28/2015 1617   EOSABS 0.2 08/18/2015 1004   BASOSABS 0.0 08/28/2015 1617   BASOSABS 0.0 08/18/2015 1004     Physical Exam: Blood pressure 110/60, pulse 66, temperature 97.7 F (36.5 C), temperature source Oral, resp. rate 19, weight 233 lb 12.8 oz (106.051 kg), SpO2 100 %. General: Well dressed, well nourished in no apparent distress.   HEENT:  Normocephalic and atraumatic, no lesions.  Extraocular muscles intact. Sclerae anicteric. Pupils equal, round, reactive. No mouth sores or ulcers. Thyroid is normal size, not nodular, midline. Skin:  No lesions or  rashes. Breasts:  deferred Lungs:  Clear to auscultation bilaterally.  No wheezes. Cardiovascular:  Regular rate and rhythm.  No murmurs or rubs. Abdomen:  Soft, nontender, nondistended.  No palpable masses.  No hepatosplenomegaly.  No ascites. Normal bowel sounds.  No hernias.  Incision is healed. Laparoscopic incision healed. Stomal site healed. Stomal appliance in situ. Genitourinary: vaginal cuff intact without blood or fluid in the vault. Some synechaie and shortening of upper vagina. No thickening or masses to suggest recurrence. Rectal exam: stump in tact, no masses Extremities: No cyanosis, clubbing or edema.  No calf tenderness or erythema. No palpable cords. Psychiatric: Mood and affect are appropriate. Neurological: Awake, alert and oriented x 3. Sensation is intact, no neuropathy.  Musculoskeletal: No pain, normal strength and range of motion.  Donaciano Eva, MD  CC: Dr Tresa Moore, Dr Alvy Bimler, Dr Theressa Millard

## 2015-09-18 ENCOUNTER — Other Ambulatory Visit: Payer: BLUE CROSS/BLUE SHIELD

## 2015-09-18 ENCOUNTER — Ambulatory Visit: Payer: BLUE CROSS/BLUE SHIELD

## 2015-09-22 ENCOUNTER — Encounter: Payer: Self-pay | Admitting: *Deleted

## 2015-09-25 ENCOUNTER — Ambulatory Visit: Payer: BLUE CROSS/BLUE SHIELD

## 2015-09-25 ENCOUNTER — Other Ambulatory Visit: Payer: BLUE CROSS/BLUE SHIELD

## 2015-09-25 ENCOUNTER — Other Ambulatory Visit: Payer: Self-pay

## 2015-09-25 DIAGNOSIS — Z1231 Encounter for screening mammogram for malignant neoplasm of breast: Secondary | ICD-10-CM

## 2015-09-25 LAB — HM DIABETES EYE EXAM

## 2015-10-01 ENCOUNTER — Encounter: Payer: Self-pay | Admitting: Family Medicine

## 2015-10-01 IMAGING — CT NM PET TUM IMG INITIAL (PI) SKULL BASE T - THIGH
7 series · 25 of 25 positions shown · non-contrast
Comparison: CT on 09/12/2014

CLINICAL DATA: Subsequent treatment strategy for recurrent
endometrial carcinoma.

EXAM:
NUCLEAR MEDICINE PET SKULL BASE TO THIGH
TECHNIQUE: 12.2 mCi F-18 FDG was injected intravenously. Full-ring PET imaging
was performed from the skull base to thigh after the radiotracer. CT
data was obtained and used for attenuation correction and anatomic
localization.
FASTING BLOOD GLUCOSE:  Value: 139 mg/dl

[Series 3: pet sk_thigh ac · axial · 5.0mm · 4.07mm/px · z∈[-1321,-465]mm · 5 of 215 slices shown]
[im 1/215]
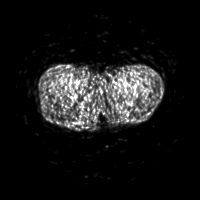
[im 54/215]
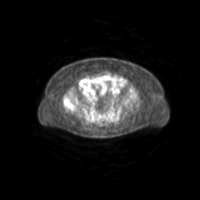
[im 108/215]
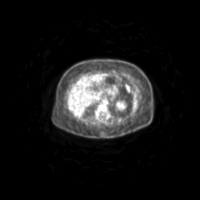
[im 161/215]
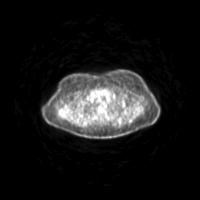
[im 215/215]
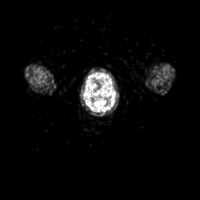

[Series 4: ct sk_thigh 5.0 hd_fov · axial · 5.0mm · 1.52mm/px · z∈[-1321,-465]mm · 5 of 215 slices shown]
[im 1/215]
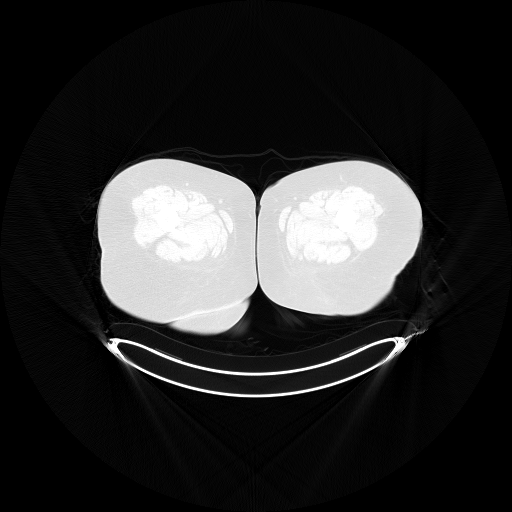
[im 54/215]
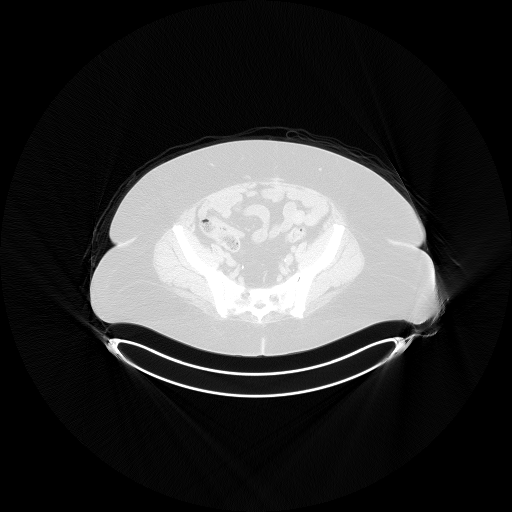
[im 108/215]
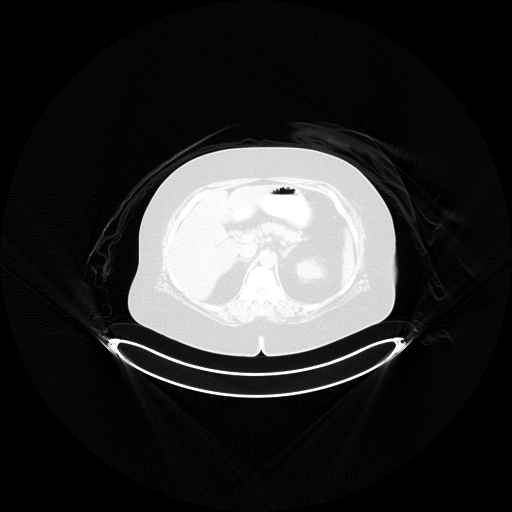
[im 161/215]
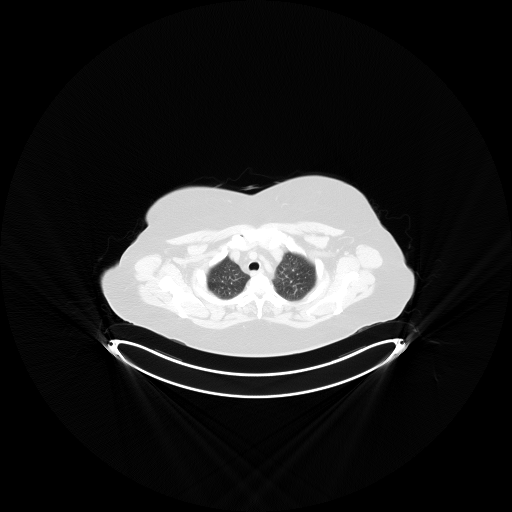
[im 215/215]
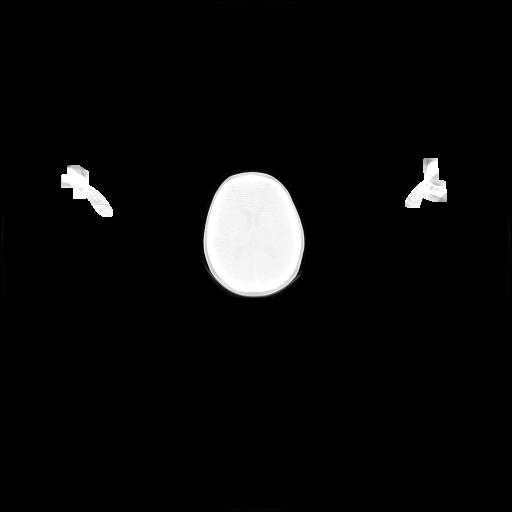

[Series 7: ct sk_thigh 5.0 b70f lung_bone · axial · 5.0mm · 0.70mm/px · 1 of 61 slices shown]
[im 1/61  bone]
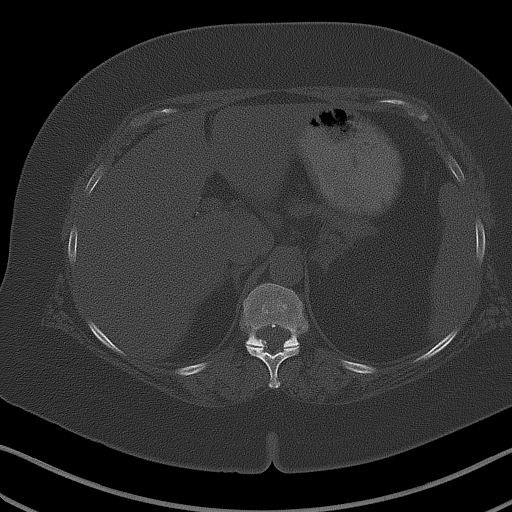

[Series 8: pet sk_thigh nac · axial · 5.0mm · 4.07mm/px · z∈[-1321,-465]mm · 5 of 215 slices shown]
[im 1/215]
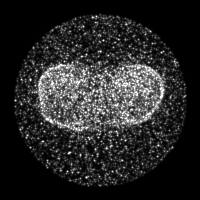
[im 54/215]
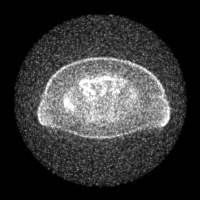
[im 108/215]
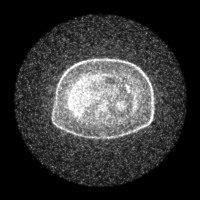
[im 161/215]
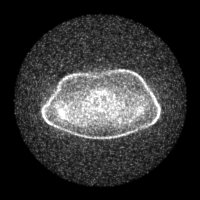
[im 215/215]
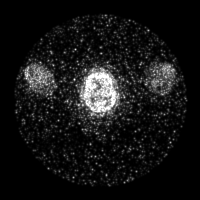

[Series 604: mip collection<mip range> · coronal · 1.78mm/px · 1 of 32 slices shown]
[im 1/32]
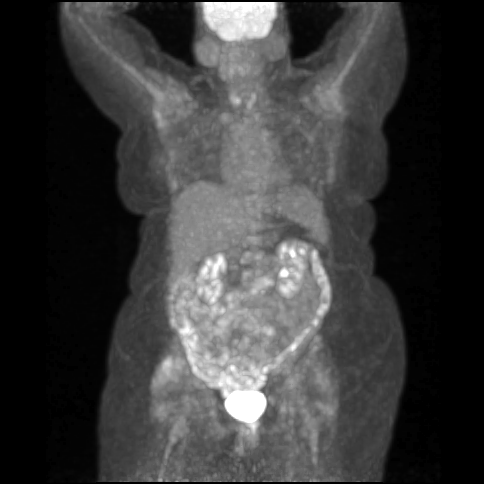

[Series 605: range-ct sk_thigh 5.0 hd_fov-cor-<alpha range> · 3 of 107 slices shown]
[im 1/107]
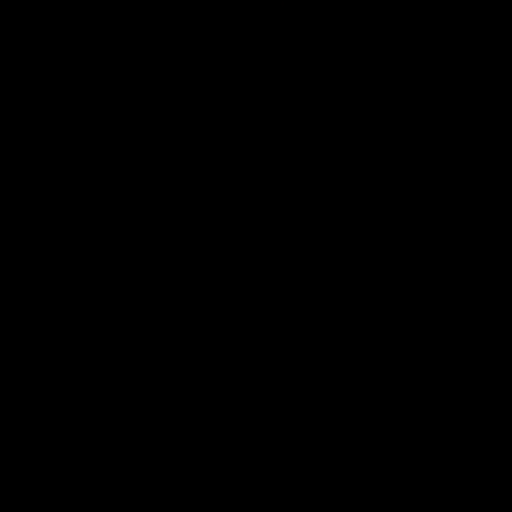
[im 54/107]
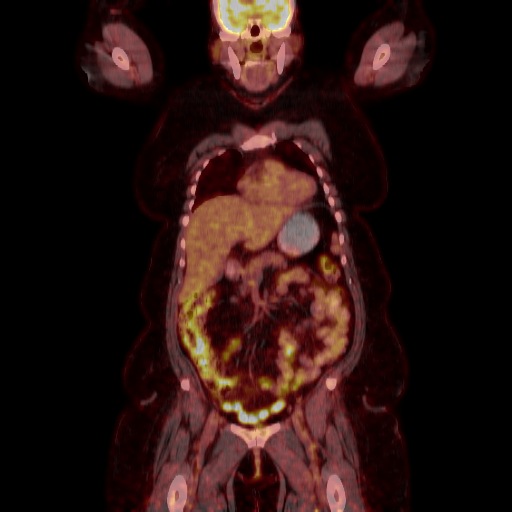
[im 107/107]
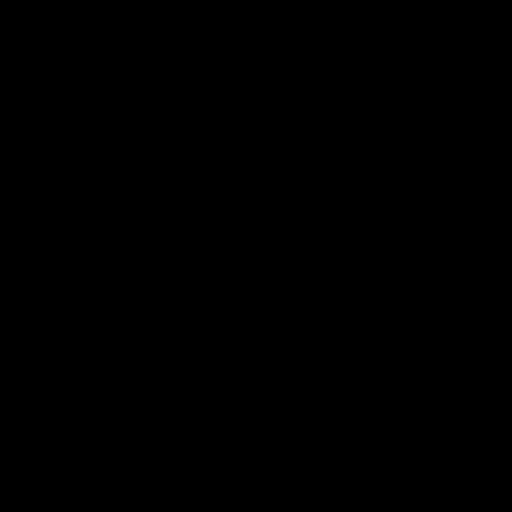

[Series 606: range-ct sk_thigh 5.0 hd_fov-tra-<alpha range> · 5 of 210 slices shown]
[im 1/210]
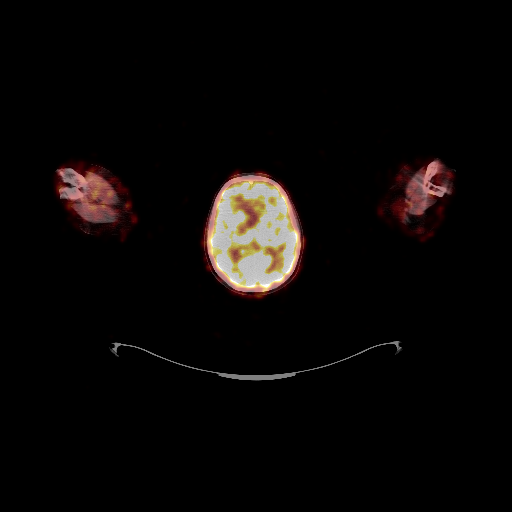
[im 53/210]
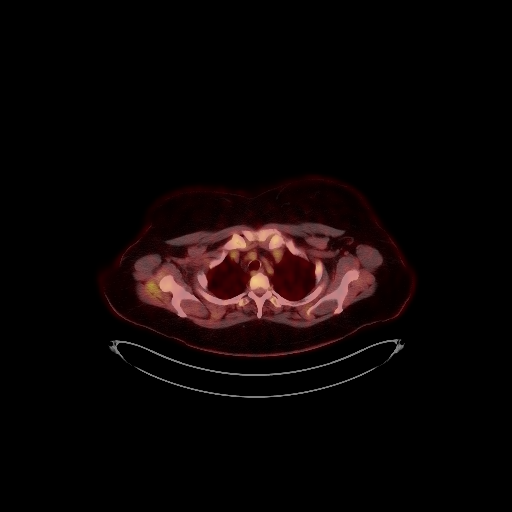
[im 105/210]
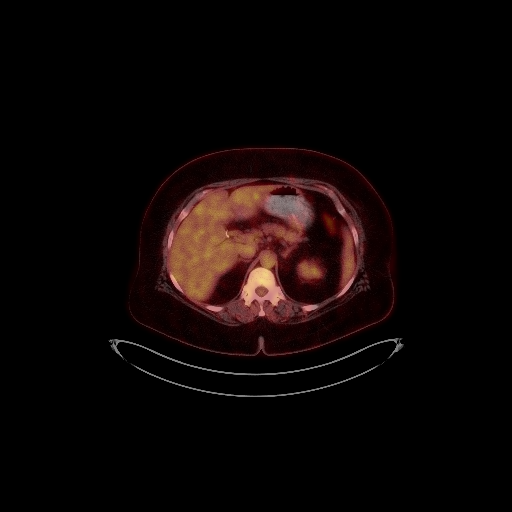
[im 157/210]
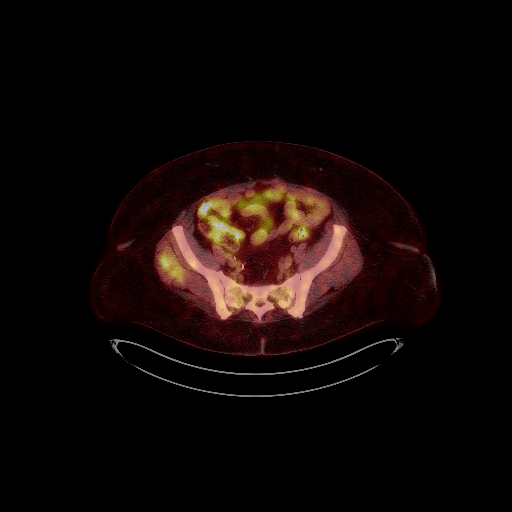
[im 210/210]
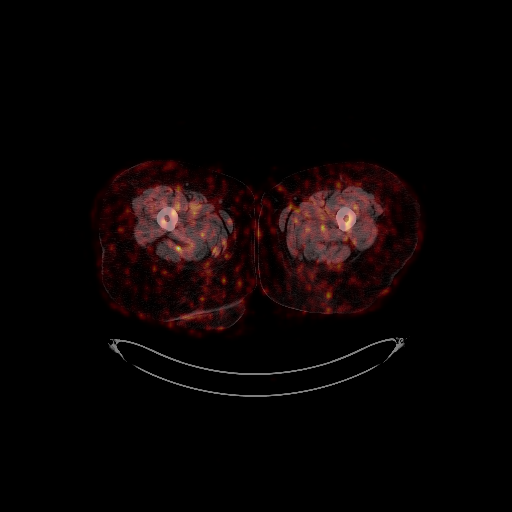

[25 of 25 positions shown; findings below may reference images not displayed]

FINDINGS: NECK

No hypermetabolic lymph nodes in the neck.

CHEST

No hypermetabolic mediastinal or hilar nodes. No suspicious
pulmonary nodules on the CT scan.

ABDOMEN/PELVIS

No abnormal hypermetabolic activity within the liver, pancreas,
adrenal glands, or spleen.

No hypermetabolic lymph nodes in the abdomen or pelvis.

Lobulated soft tissue mass involving the vaginal cuff in pelvic
cul-de-sac chest to the right of midline measures 3.5 x 5.0 cm on
image 181 of series 4. This shows intense hypermetabolic activity.
SUV max is difficult due to adjacent rectal and bladder activity,
however SUV max measures 9.8.

SKELETON

No focal hypermetabolic activity to suggest skeletal metastasis.
IMPRESSION: Hypermetabolic pelvic soft tissue mass involving the vaginal cuff
and pelvic cul-de-sac, consistent with recurrent carcinoma.

No evidence of pelvic lymph node or distant metastatic disease.

## 2015-10-02 ENCOUNTER — Other Ambulatory Visit: Payer: BLUE CROSS/BLUE SHIELD

## 2015-10-02 ENCOUNTER — Ambulatory Visit: Payer: BLUE CROSS/BLUE SHIELD

## 2015-10-08 ENCOUNTER — Ambulatory Visit (HOSPITAL_BASED_OUTPATIENT_CLINIC_OR_DEPARTMENT_OTHER): Payer: BLUE CROSS/BLUE SHIELD | Admitting: Hematology and Oncology

## 2015-10-08 ENCOUNTER — Other Ambulatory Visit (HOSPITAL_BASED_OUTPATIENT_CLINIC_OR_DEPARTMENT_OTHER): Payer: BLUE CROSS/BLUE SHIELD

## 2015-10-08 ENCOUNTER — Telehealth: Payer: Self-pay | Admitting: *Deleted

## 2015-10-08 ENCOUNTER — Ambulatory Visit: Payer: BLUE CROSS/BLUE SHIELD

## 2015-10-08 ENCOUNTER — Other Ambulatory Visit: Payer: Self-pay | Admitting: Hematology and Oncology

## 2015-10-08 VITALS — BP 117/57 | HR 96 | Temp 100.2°F | Resp 18 | Ht 62.5 in | Wt 229.6 lb

## 2015-10-08 DIAGNOSIS — E860 Dehydration: Secondary | ICD-10-CM | POA: Diagnosis not present

## 2015-10-08 DIAGNOSIS — D709 Neutropenia, unspecified: Secondary | ICD-10-CM

## 2015-10-08 DIAGNOSIS — R509 Fever, unspecified: Secondary | ICD-10-CM | POA: Diagnosis not present

## 2015-10-08 DIAGNOSIS — Z8543 Personal history of malignant neoplasm of ovary: Secondary | ICD-10-CM

## 2015-10-08 LAB — URINALYSIS, MICROSCOPIC - CHCC
Bacteria, UA: NEGATIVE
Bilirubin (Urine): NEGATIVE
GLUCOSE UR CHCC: 2000 mg/dL
Ketones: NEGATIVE mg/dL
LEUKOCYTE ESTERASE: NEGATIVE
Nitrite: NEGATIVE
PH: 6 (ref 4.6–8.0)
SPECIFIC GRAVITY, URINE: 1.02 (ref 1.003–1.035)
UROBILINOGEN UR: 0.2 mg/dL (ref 0.2–1)

## 2015-10-08 LAB — COMPREHENSIVE METABOLIC PANEL
ALK PHOS: 89 U/L (ref 40–150)
ALT: 27 U/L (ref 0–55)
ANION GAP: 11 meq/L (ref 3–11)
AST: 19 U/L (ref 5–34)
Albumin: 3.4 g/dL — ABNORMAL LOW (ref 3.5–5.0)
BILIRUBIN TOTAL: 0.41 mg/dL (ref 0.20–1.20)
BUN: 20.5 mg/dL (ref 7.0–26.0)
CALCIUM: 9.3 mg/dL (ref 8.4–10.4)
CO2: 27 mEq/L (ref 22–29)
CREATININE: 1.3 mg/dL — AB (ref 0.6–1.1)
Chloride: 101 mEq/L (ref 98–109)
EGFR: 46 mL/min/{1.73_m2} — ABNORMAL LOW (ref >90)
Glucose: 78 mg/dl (ref 70–140)
Potassium: 4 mEq/L (ref 3.5–5.1)
Sodium: 138 mEq/L (ref 136–145)
TOTAL PROTEIN: 7.5 g/dL (ref 6.4–8.3)

## 2015-10-08 LAB — CBC & DIFF AND RETIC
BASO%: 0 % (ref 0.0–2.0)
BASOS ABS: 0 10*3/uL (ref 0.0–0.1)
EOS%: 0 % (ref 0.0–7.0)
Eosinophils Absolute: 0 10*3/uL (ref 0.0–0.5)
HEMATOCRIT: 37.8 % (ref 34.8–46.6)
HGB: 12.1 g/dL (ref 11.6–15.9)
IMMATURE RETIC FRACT: 8.3 % (ref 1.60–10.00)
LYMPH#: 0.8 10*3/uL — AB (ref 0.9–3.3)
LYMPH%: 41.6 % (ref 14.0–49.7)
MCH: 27.6 pg (ref 25.1–34.0)
MCHC: 32 g/dL (ref 31.5–36.0)
MCV: 86.3 fL (ref 79.5–101.0)
MONO#: 0.3 10*3/uL (ref 0.1–0.9)
MONO%: 16.2 % — ABNORMAL HIGH (ref 0.0–14.0)
NEUT#: 0.8 10*3/uL — ABNORMAL LOW (ref 1.5–6.5)
NEUT%: 42.2 % (ref 38.4–76.8)
PLATELETS: 189 10*3/uL (ref 145–400)
RBC: 4.38 10*6/uL (ref 3.70–5.45)
RDW: 15.9 % — ABNORMAL HIGH (ref 11.2–14.5)
RETIC CT ABS: 58.25 10*3/uL (ref 33.70–90.70)
Retic %: 1.33 % (ref 0.70–2.10)
WBC: 2 10*3/uL — ABNORMAL LOW (ref 3.9–10.3)

## 2015-10-08 LAB — INFLUENZA A AND B
Influenza A Ag, EIA: NEGATIVE
Influenza B Ag, EIA: NEGATIVE

## 2015-10-08 MED ORDER — LEVOFLOXACIN 750 MG PO TABS: 750.0000 mg | ORAL_TABLET | Freq: Every day | ORAL | Status: DC

## 2015-10-08 NOTE — Telephone Encounter (Signed)
"  I have chronic neutropenia and was instructed to call for fevers.  Last night I felt chills, sinus congestion, had to sit down, got dark like I was going to pass out and didn't feel great.  Couldn't drive.  Temp = 99.1.  This morning temp = 100.2.  Chills come and go during the night.  I do not think I need to go to th ED but what would she like me to do.  Not sure if I hae the flu or what.  [phone]."  Will notify Dr. Alvy Bimler.  [name] does not think she can drive today.  Trying to call husband.

## 2015-10-08 NOTE — Telephone Encounter (Signed)
Instructed pt to come today at 2 pm for 2:15 pm lab and 2:45 pm Dr. Alvy Bimler.  She agreed and will be here around 2 pm..  Urgent POF sent to Scheduler.

## 2015-10-08 NOTE — Telephone Encounter (Signed)
It is difficult to assess without examination and further testing if she has neutropenic fever or not Patient had recurrent UTI. I do not recommend empiric oral antibiotics without assessment because if she has neutropenic fever she may need IV antibiotics. I can see her at 245 pm today as an add on with labs before appt Otherwise she may have to go to ED or urgent care

## 2015-10-08 NOTE — Telephone Encounter (Signed)
Notified pt her Influenza A and B results are Negative.  Her U/A also looks normal.  Informed her of Creatinine a little elevated and make sure to push a lot of fluids.  Take antibiotic as prescribed by Dr. Alvy Bimler and call us if she gets any worse.  Pt verbalized understanding.

## 2015-10-09 ENCOUNTER — Other Ambulatory Visit: Payer: BLUE CROSS/BLUE SHIELD

## 2015-10-09 ENCOUNTER — Ambulatory Visit: Payer: BLUE CROSS/BLUE SHIELD

## 2015-10-09 ENCOUNTER — Encounter: Payer: Self-pay | Admitting: Hematology and Oncology

## 2015-10-09 DIAGNOSIS — E860 Dehydration: Secondary | ICD-10-CM | POA: Insufficient documentation

## 2015-10-09 LAB — URINE CULTURE

## 2015-10-09 NOTE — Assessment & Plan Note (Signed)
Likely source of fever is viral URI but bacterial infection cannot be excluded Influenza swab is negative I recommend broad spectrum oral antibiotics coverage due to high risk of infection The patient is instructed to call me at the end of the week if not better If she deteriorates she needs to go to the ED immediately

## 2015-10-09 NOTE — Progress Notes (Signed)
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 Going to the patient, initial diagnosis with ovarian cancer was when she presented with abdomen and no swelling and pelvic pain leading to her urgent care visit. She states that the tumor had caused torsion of the ovary. She cannot remember the stage of her disease. She began to develop leukopenia in approximately July of 2010. She underwent an initial bone marrow biopsy at [facility] in [place] on 03/17/2009. but it was nondiagnostic. No gross dysplastic changes, no excess blasts, normal cytogenetics.  Her counts have been stable over observation through this office since December 2010 with some minor fluctuations. Total white counts run as low as 1800 and as high as 2900. Percent neutrophils fluctuates very widely from as low as 4% recorded on a CBC done 02/03/2010 to as high as 31% with average being 20%. She was hospitalized in the ICU at that time  Both hemoglobin and platelets have remained stable over time.  Despite leukopenia, she has not had any recurrent or severe infections. She stated strong family history of lupus in a cousin. She complained of occasional skin rash and joint stiffness. 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 She received G-CSF in January of 2015 to stimulate white blood cell production around a colonoscopy procedure a few years ago. She had a limited response. On 01/23/2014, repeat bone marrow biopsy excluded lymphoma or leukemia. On 09/12/2014, CT scan of the abdomen and pelvis show possible relapse of gynecological Malignancy On 10/14/2014, MRI of the pelvis show significant pelvic mass without invasion into the bladder but abutting to the rectum On 11/19/2014, she underwent surgery and had robotic-assisted lysis of adhesions, converted to laparotomy, radical upper vaginectomy and low anterior resection with colostomy. Bilateral ureteral stent placement and cystotomy repair. 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 Resection margins were negative. She was hospitalized from 11/19/2014 to 11/25/2014. In May, showed recurrent admission to the hospital due to complication with infection. On 01/29/2015, she began adjuvant radiation treatment On 03/03/2015, CT scan of the abdomen and pelvis showed status post interval removal of the bilateral nephroureteral stents. Worsening moderate right hydroureteronephrosis. Resolved left hydroureteronephrosis. On 03/20/2015, she had cystoscopy and stent placement for right hydronephrosis On 05/14/2015, CT scan of the abdomen and pelvis show unilateral right hydronephrosis with no residual cancer Since November 2016, she was able to self administer Neupogen at home.  INTERVAL HISTORY: Please see below for problem oriented charting. She is seen urgently today because of several reported low-grade fever, chills, recent diarrhea, as well as nasal congestion and productive cough. She has sick contact with her husband at home with similar symptoms. She denies dysuria, frequency or urgency. She had poor  oral intake with mild anorexia  REVIEW OF SYSTEMS:   Constitutional: Denies fevers, chills or abnormal weight loss  Eyes: Denies blurriness of vision Cardiovascular: Denies palpitation, chest discomfort or lower extremity swelling Gastrointestinal:  Denies nausea, heartburn  Skin: Denies abnormal skin rashes Lymphatics: Denies new lymphadenopathy or easy bruising Neurological:Denies numbness, tingling or new weaknesses Behavioral/Psych: Mood is stable, no new changes  All other systems were reviewed with the patient and are negative.  I have reviewed the past medical history, past surgical history, social history and family history with the patient and they are unchanged from previous note.  ALLERGIES:  is allergic to penicillins; ultram; adhesive; cefaclor; erythromycin; trimethoprim; pectin; and sulfa antibiotics.  MEDICATIONS:  Current Outpatient Prescriptions  Medication Sig Dispense Refill  . aspirin 81 MG tablet Take 81 mg by mouth at bedtime. Reported on 09/15/2015    . Biotin 5 MG TABS Take 1 tablet by mouth every morning.     . Canagliflozin (INVOKANA) 300 MG TABS Take 1 tablet by mouth every morning.     . Cholecalciferol (VITAMIN D3) 10000 UNITS capsule Take 10,000 Units by mouth once a week. Sundays    . diphenhydrAMINE (BENADRYL) 25 MG tablet Take 25 mg by mouth every 4 (four) hours as needed for itching or allergies. Reported on 09/15/2015    . filgrastim (NEUPOGEN) 480 MCG/1.6ML injection Inject 1.6 mLs (480 mcg total) into the skin once a week. (Patient taking differently: Inject 480 mcg into the skin once a week. On thursdays) 19.2 mL 0  . fluticasone (FLOVENT HFA) 110 MCG/ACT inhaler Inhale 1 puff into the lungs as needed (SOB and Wheezing). Reported on 09/15/2015    . hyaluronate sodium (RADIAPLEXRX) GEL Apply 1 application topically 2 (two) times daily as needed (scars). Reported on 09/15/2015    . levothyroxine (SYNTHROID) 175 MCG tablet Take 175 mcg by mouth daily before  breakfast.    . loratadine (CLARITIN) 10 MG tablet Take 10 mg by mouth daily.    . metFORMIN (GLUCOPHAGE) 1000 MG tablet Take 1,000 mg by mouth 2 (two) times daily with a meal.     . Multiple Vitamin (MULTIVITAMIN WITH MINERALS) TABS tablet Take 1 tablet by mouth daily.    [name] Kitchen omega-3 acid ethyl esters (LOVAZA) 1 G capsule Take 1 g by mouth 2 (two) times daily.    [name] Kitchen omeprazole (PRILOSEC) 20 MG capsule Take 20 mg by mouth at bedtime.     [name] Kitchen oxyCODONE-acetaminophen (ROXICET) 5-325 MG tablet Take 1 tablet by mouth every 4 (four) hours as needed for severe pain. Post-operatively 15 tablet 0  . Polyethyl Glycol-Propyl Glycol (SYSTANE OP) Apply 1 drop to eye at bedtime.     . rosuvastatin (CRESTOR) 10 MG tablet Take 10 mg by mouth every evening.     . sitaGLIPtin (JANUVIA) 100 MG tablet Take 100 mg by mouth daily.    . valsartan (DIOVAN) 320 MG tablet Take 320 mg by mouth every morning.     [name] Kitchen levofloxacin (LEVAQUIN) 750 MG tablet Take 1 tablet (750 mg total) by mouth daily. 7 tablet 0   No current facility-administered medications for this visit.    PHYSICAL EXAMINATION: ECOG PERFORMANCE STATUS: 1 - Symptomatic but completely ambulatory  Filed Vitals:   10/08/15 1425  BP: 117/57  Pulse: 96  Temp: 100.2 F (37.9 C)  Resp: 18   Filed Weights   10/08/15 1425  Weight: 229 lb 9.6 oz (104.146 kg)    GENERAL:alert, no distress and comfortable. She is ill appearing with sinus congestion SKIN: skin color, texture, turgor are normal, no rashes or significant lesions EYES: normal, Conjunctiva  are pink and non-injected, sclera clear OROPHARYNX:no exudate, no erythema and lips, buccal mucosa, and tongue normal  NECK: supple, thyroid normal size, non-tender, without nodularity LYMPH:  no palpable lymphadenopathy in the cervical, axillary or inguinal LUNGS: clear to auscultation and percussion with normal breathing effort HEART: regular rate & rhythm and no murmurs and no lower extremity  edema ABDOMEN:abdomen soft, non-tender and normal bowel sounds Musculoskeletal:no cyanosis of digits and no clubbing  NEURO: alert & oriented x 3 with fluent speech, no focal motor/sensory deficits  LABORATORY DATA:  I have reviewed the data as listed    Component Value Date/Time   NA 138 10/08/2015 1409   NA 140 08/28/2015 1617   K 4.0 10/08/2015 1409   K 3.7 08/28/2015 1617   CL 102 08/28/2015 1617   CO2 27 10/08/2015 1409   CO2 28 08/28/2015 1617   GLUCOSE 78 10/08/2015 1409   GLUCOSE 77 08/28/2015 1617   BUN 20.5 10/08/2015 1409   BUN 25* 08/28/2015 1617   CREATININE 1.3* 10/08/2015 1409   CREATININE 1.11 08/28/2015 1617   CREATININE 0.80 01/06/2015 1610   CALCIUM 9.3 10/08/2015 1409   CALCIUM 9.6 08/28/2015 1617   PROT 7.5 10/08/2015 1409   PROT 7.1 08/28/2015 1617   ALBUMIN 3.4* 10/08/2015 1409   ALBUMIN 4.1 08/28/2015 1617   AST 19 10/08/2015 1409   AST 17 08/28/2015 1617   ALT 27 10/08/2015 1409   ALT 22 08/28/2015 1617   ALKPHOS 89 10/08/2015 1409   ALKPHOS 79 08/28/2015 1617   BILITOT 0.41 10/08/2015 1409   BILITOT 0.4 08/28/2015 1617   GFRNONAA >60 01/02/2015 0400   GFRNONAA 78 12/16/2014 1530   GFRAA >60 01/02/2015 0400   GFRAA >89 12/16/2014 1530    No results found for: SPEP, UPEP  Lab Results  Component Value Date   WBC 2.0* 10/08/2015   NEUTROABS 0.8* 10/08/2015   HGB 12.1 10/08/2015   HCT 37.8 10/08/2015   MCV 86.3 10/08/2015   PLT 189 10/08/2015      Chemistry      Component Value Date/Time   NA 138 10/08/2015 1409   NA 140 08/28/2015 1617   K 4.0 10/08/2015 1409   K 3.7 08/28/2015 1617   CL 102 08/28/2015 1617   CO2 27 10/08/2015 1409   CO2 28 08/28/2015 1617   BUN 20.5 10/08/2015 1409   BUN 25* 08/28/2015 1617   CREATININE 1.3* 10/08/2015 1409   CREATININE 1.11 08/28/2015 1617   CREATININE 0.80 01/06/2015 1610      Component Value Date/Time   CALCIUM 9.3 10/08/2015 1409   CALCIUM 9.6 08/28/2015 1617   ALKPHOS 89 10/08/2015  1409   ALKPHOS 79 08/28/2015 1617   AST 19 10/08/2015 1409   AST 17 08/28/2015 1617   ALT 27 10/08/2015 1409   ALT 22 08/28/2015 1617   BILITOT 0.41 10/08/2015 1409   BILITOT 0.4 08/28/2015 1617      ASSESSMENT & PLAN:  Chronic neutropenia Due to her history of recurrent infection, I recommend giving her G-CSF weekly  Her Neupogen dose is not due till Thursday but due to recent low grade fever, I recommended she takes neupogen immediately  Fever Likely source of fever is viral URI but bacterial infection cannot be excluded Influenza swab is negative I recommend broad spectrum oral antibiotics coverage due to high risk of infection The patient is instructed to call me at the end of the week if not better If she deteriorates she needs to go  to the ED immediately  Dehydration She is clinically dehydrated with mild hypotension and elevated Creatinine I recommended IVF but the patient felt that she could drink adequately   Orders Placed This Encounter  Procedures  . Influenza A and B    Standing Status: Future     Number of Occurrences: 1     Standing Expiration Date: 10/07/2016   All questions were answered. The patient knows to call the clinic with any problems, questions or concerns. No barriers to learning was detected. I spent 15 minutes counseling the patient face to face. The total time spent in the appointment was 20 minutes and more than 50% was on counseling and review of test results     [facility], [place], MD 10/09/2015 6:50 AM

## 2015-10-09 NOTE — Assessment & Plan Note (Signed)
She is clinically dehydrated with mild hypotension and elevated Creatinine I recommended IVF but the patient felt that she could drink adequately

## 2015-10-09 NOTE — Assessment & Plan Note (Signed)
Due to her history of recurrent infection, I recommend giving her G-CSF weekly  Her Neupogen dose is not due till Thursday but due to recent low grade fever, I recommended she takes neupogen immediately

## 2015-10-10 ENCOUNTER — Telehealth: Payer: Self-pay | Admitting: *Deleted

## 2015-10-10 NOTE — Telephone Encounter (Signed)
Notified pt urine culture negative.  She states she is "slowly improving."  She has not had any fevers in the past few days.  She is trying to drink a lot of fluids.  Instructed pt to continue to push fluids and call us if she has any worsening symptoms.  She verbalized understanding.

## 2015-10-10 NOTE — Telephone Encounter (Signed)
-----   Message from Heath Lark, MD sent at 10/10/2015  6:57 AM EST ----- Regarding: negative culture Culture is negative Can you ask to see how she is doing? ----- Message -----    From: [facility] Interface    Sent: 10/08/2015   2:18 PM      To: Heath Lark, MD

## 2015-10-14 ENCOUNTER — Ambulatory Visit
Admission: RE | Admit: 2015-10-14 | Discharge: 2015-10-14 | Disposition: A | Discharge status: Home / Self Care | Payer: BLUE CROSS/BLUE SHIELD | Source: Ambulatory Visit

## 2015-10-14 DIAGNOSIS — Z1231 Encounter for screening mammogram for malignant neoplasm of breast: Secondary | ICD-10-CM

## 2015-10-16 ENCOUNTER — Ambulatory Visit: Payer: BLUE CROSS/BLUE SHIELD

## 2015-10-16 ENCOUNTER — Other Ambulatory Visit: Payer: BLUE CROSS/BLUE SHIELD

## 2015-10-17 ENCOUNTER — Telehealth: Payer: Self-pay | Admitting: *Deleted

## 2015-10-17 ENCOUNTER — Other Ambulatory Visit: Payer: Self-pay | Admitting: Family Medicine

## 2015-10-17 DIAGNOSIS — R928 Other abnormal and inconclusive findings on diagnostic imaging of breast: Secondary | ICD-10-CM

## 2015-10-17 NOTE — Telephone Encounter (Signed)
Received physician order; forwarded to provider/SLS 03/17

## 2015-10-23 ENCOUNTER — Other Ambulatory Visit: Payer: BLUE CROSS/BLUE SHIELD

## 2015-10-23 ENCOUNTER — Ambulatory Visit: Payer: BLUE CROSS/BLUE SHIELD

## 2015-10-24 ENCOUNTER — Encounter: Payer: Self-pay | Admitting: Hematology and Oncology

## 2015-10-24 ENCOUNTER — Ambulatory Visit
Admission: RE | Admit: 2015-10-24 | Discharge: 2015-10-24 | Disposition: A | Discharge status: Home / Self Care | Payer: BLUE CROSS/BLUE SHIELD | Source: Ambulatory Visit | Attending: Family Medicine | Admitting: Family Medicine

## 2015-10-24 ENCOUNTER — Other Ambulatory Visit: Payer: Self-pay | Admitting: Family Medicine

## 2015-10-24 DIAGNOSIS — R928 Other abnormal and inconclusive findings on diagnostic imaging of breast: Secondary | ICD-10-CM

## 2015-10-24 DIAGNOSIS — N6320 Unspecified lump in the left breast, unspecified quadrant: Secondary | ICD-10-CM

## 2015-10-30 ENCOUNTER — Other Ambulatory Visit: Payer: Self-pay | Admitting: Hematology and Oncology

## 2015-10-30 ENCOUNTER — Ambulatory Visit
Admission: RE | Admit: 2015-10-30 | Discharge: 2015-10-30 | Disposition: A | Discharge status: Home / Self Care | Payer: BLUE CROSS/BLUE SHIELD | Source: Ambulatory Visit | Attending: Family Medicine | Admitting: Family Medicine

## 2015-10-30 ENCOUNTER — Other Ambulatory Visit: Payer: BLUE CROSS/BLUE SHIELD

## 2015-10-30 ENCOUNTER — Ambulatory Visit: Payer: BLUE CROSS/BLUE SHIELD

## 2015-10-30 ENCOUNTER — Other Ambulatory Visit: Payer: Self-pay | Admitting: Family Medicine

## 2015-10-30 DIAGNOSIS — N6320 Unspecified lump in the left breast, unspecified quadrant: Secondary | ICD-10-CM

## 2015-11-03 ENCOUNTER — Other Ambulatory Visit: Payer: Self-pay | Admitting: General Surgery

## 2015-11-03 ENCOUNTER — Telehealth: Payer: Self-pay | Admitting: *Deleted

## 2015-11-03 DIAGNOSIS — C50212 Malignant neoplasm of upper-inner quadrant of left female breast: Secondary | ICD-10-CM

## 2015-11-03 NOTE — Telephone Encounter (Signed)
  Oncology Nurse Navigator Documentation  Navigator Location: [facility] (11/03/15 1700) Navigator Encounter Type: Introductory phone call (11/03/15 1700)   Abnormal Finding Date: 10/24/15 (11/03/15 1700) Confirmed Diagnosis Date: 10/30/15 (11/03/15 1700)         Barriers/Navigation Needs: No barriers at this time (11/03/15 1700)   Interventions: Coordination of Care (11/03/15 1700)   Coordination of Care: Appts (Genetic counseling and plastic referral) (11/03/15 1700)        Acuity: Level 2 (11/03/15 1700)         Time Spent with Patient: 30 (11/03/15 1700)

## 2015-11-04 ENCOUNTER — Telehealth: Payer: Self-pay | Admitting: *Deleted

## 2015-11-04 NOTE — Telephone Encounter (Signed)
  Oncology Nurse Navigator Documentation    Navigator Encounter Type: Telephone (11/04/15 1200) Telephone: Outgoing Call (11/04/15 1200)                 Interventions: Other (Prognostic Panel Discussion) (11/04/15 1200)                      Time Spent with Patient: 30 (11/04/15 1200)

## 2015-11-04 NOTE — Telephone Encounter (Signed)
Scheduled and confirmed genetics appt on 11/05/15 at 1100 and appt with Dr. Iran Planas on 11/05/15 at 8:30. Denies further questions at this time. Encourage pt to call with needs or concerns. Received verbal understanding.

## 2015-11-05 ENCOUNTER — Encounter: Payer: Self-pay | Admitting: Genetic Counselor

## 2015-11-05 ENCOUNTER — Other Ambulatory Visit: Payer: BLUE CROSS/BLUE SHIELD

## 2015-11-05 ENCOUNTER — Ambulatory Visit (HOSPITAL_BASED_OUTPATIENT_CLINIC_OR_DEPARTMENT_OTHER): Payer: BLUE CROSS/BLUE SHIELD | Admitting: Genetic Counselor

## 2015-11-05 DIAGNOSIS — Z315 Encounter for genetic counseling: Secondary | ICD-10-CM

## 2015-11-05 DIAGNOSIS — C50212 Malignant neoplasm of upper-inner quadrant of left female breast: Secondary | ICD-10-CM | POA: Diagnosis not present

## 2015-11-05 DIAGNOSIS — Z800 Family history of malignant neoplasm of digestive organs: Secondary | ICD-10-CM

## 2015-11-05 DIAGNOSIS — Z803 Family history of malignant neoplasm of breast: Secondary | ICD-10-CM | POA: Diagnosis not present

## 2015-11-05 DIAGNOSIS — Z8542 Personal history of malignant neoplasm of other parts of uterus: Secondary | ICD-10-CM | POA: Diagnosis not present

## 2015-11-05 DIAGNOSIS — Z808 Family history of malignant neoplasm of other organs or systems: Secondary | ICD-10-CM

## 2015-11-05 DIAGNOSIS — Z8543 Personal history of malignant neoplasm of ovary: Secondary | ICD-10-CM | POA: Diagnosis not present

## 2015-11-05 DIAGNOSIS — C50919 Malignant neoplasm of unspecified site of unspecified female breast: Secondary | ICD-10-CM

## 2015-11-05 NOTE — Progress Notes (Signed)
REFERRING PROVIDER: Ann Held, DO [address] [place], [place] [address]   Everitt Amber, MD  Nicholas Lose, MD  PRIMARY PROVIDER:  Ann Held, DO  PRIMARY REASON FOR VISIT:  1. History of ovarian cancer   2. History of endometrial cancer   3. Malignant neoplasm of female breast, unspecified laterality, unspecified site of breast ([place])      HISTORY OF PRESENT ILLNESS:   [name], a 60 y.o. female, was seen for a [facility] cancer genetics consultation at the request of Dr. Denman [name] due to a personal and family history of cancer.  [name] presents to clinic today to discuss the possibility of a hereditary predisposition to cancer, genetic testing, and to further clarify her future cancer risks, as well as potential cancer risks for family members.   In 2006, at the age of 1, [name] was diagnosed with synchronous uterine and ovarian cancer. This was treated with surgery and radiation.  In 2017, at the age of 23, [name] was diagnosed with breast cancer.  The tumor is ER+/PR+/Her2-.  She is scheduled to see Dr. Lindi Adie soon.      CANCER HISTORY:   No history exists.     HORMONAL RISK FACTORS:  Menarche was at age 73.  First live birth at age 30.  OCP use for approximately 0 years.  Ovaries intact: no.  Hysterectomy: yes.  Menopausal status: premenopausal.  HRT use: 0 years. Colonoscopy: yes; some polyps. Mammogram within the last year: yes. Number of breast biopsies: 1. Up to date with pelvic exams:  yes. Any excessive radiation exposure in the past:  Through cancer treatment  Past Medical History  Diagnosis Date  . Benign essential HTN 12/14/2011  . Multiple thyroid nodules     Managed by Dr. Harlow Asa  . GERD (gastroesophageal reflux disease)   . Depression   . Dysuria   . Radiation-induced dermatitis     contact dermatitis , radiation completed, rash only on ankles now.  . History of gastric polyp     2014  duodenum  .  History of radiation therapy     01-29-2015 to 03-10-2015  pelvis 50.4Gy  . Hypothyroidism   . DM type 2 (diabetes mellitus, type 2) ([place])   . Hiatal hernia   . Anemia in neoplastic disease   . Chronic idiopathic neutropenia ([facility])     presumed related to chemotherapy March 2006--- followed by dr Alvy Bimler   . Ureteral stricture, right   . S/P colostomy ([place])   . Hydronephrosis   . History of renal stent   . Neutropenia ([place])   . Cancer of corpus uteri, except isthmus [facility]) oncologist-- dr Denman [name] and dr Alvy Bimler     Mar 2006 dx endometrial and ovarian cancer s/p  chemotheapy and surgery  . Breast cancer ([place]) 2017    Past Surgical History  Procedure Laterality Date  . Appendectomy    . Tonsillectomy    . Eye surgery      pytosis of eyelids-child  . Colonoscopy with propofol N/A 08/21/2013    Procedure: COLONOSCOPY WITH PROPOFOL;  Surgeon: Cleotis Nipper, MD;  Location: [facility];  Service: Endoscopy;  Laterality: N/A;  . Eus N/A 10/02/2014    Procedure: LOWER ENDOSCOPIC ULTRASOUND (EUS);  Surgeon: Arta Silence, MD;  Location: [facility];  Service: Endoscopy;  Laterality: N/A;  . Robotic assisted lap vaginal hysterectomy N/A 11/19/2014    Procedure: ROBOTIC LYSIS OF ADHESIONS, CONVERTED TO  LAPAROTOMY RADICAL UPPER VAGINECTOMY,LOW ANTERIOR BOWEL RESECTION, COLOSTOMY, BILATERAL URETERAL STENT PLACEMENT AND CYSTONOMY CLOSURE;  Surgeon: Everitt Amber, MD;  Location: [facility]S;  Service: Gynecology;  Laterality: N/A;  . Ostomy N/A 11/19/2014    Procedure: OSTOMY;  Surgeon: Michael [name], MD;  Location: [facility]S;  Service: General;  Laterality: N/A;  . Colostomy takedown N/A 12/04/2014    Procedure: LAPROSCOPIC LYSIS OF ADHESIONS, SPLENIC MOBILIZATION, RELOCATION OF COLOSTOMY, DEBRIDEMENT INITIAL COLOSTOMY SITE;  Surgeon: Michael [name], MD;  Location: [facility];  Service: General;  Laterality: N/A;  . Cystoscopy with retrograde pyelogram, ureteroscopy and stent placement Right 03/20/2015    Procedure:  CYSTOSCOPY WITH RETROGRADE PYELOGRAM, URETEROSCOPY WITH BALLOON DILATION AND STENT PLACEMENT ON RIGHT;  Surgeon: Alexis Frock, MD;  Location: [facility];  Service: Urology;  Laterality: Right;  . Excision soft tissue mass right foreman  12-08-2006  . Abdominal hysterectomy  2006    staging for Ovarian cancer  . Laparoscopic cholecystectomy    . Cystoscopy with retrograde pyelogram, ureteroscopy and stent placement Right 05/02/2015    Procedure: CYSTOSCOPY WITH RIGHT RETROGRADE PYELOGRAM,  DIAGNOSTIC URETEROSCOPY AND STENT PULL ;  Surgeon: Alexis Frock, MD;  Location[facility];  Service: Urology;  Laterality: Right;  . Cystoscopy with retrograde pyelogram, ureteroscopy and stent placement Right 09/05/2015    Procedure: CYSTOSCOPY WITH RETROGRADE PYELOGRAM,  AND STENT PLACEMENT;  Surgeon: Alexis Frock, MD;  Location: [facility];  Service: Urology;  Laterality: Right;    Social History   Social History  . Marital Status: Married    Spouse Name: N/A  . Number of Children: 1  . Years of Education: N/A   Occupational History  . retired Therapist, sports from [place] History Main Topics  . Smoking status: Never Smoker   . Smokeless tobacco: Never Used  . Alcohol Use: Yes     Comment: rare social  . Drug Use: No  . Sexual Activity: Not Currently   Other Topics Concern  . None   Social History Narrative   Exercise-- has not gotten back into it since cancer came back     FAMILY HISTORY:  We obtained a detailed, 4-generation family history.  Significant diagnoses are listed below: Family History  Problem Relation Age of Onset  . Cancer Mother 69    stomach ca  . Hypertension Mother   . Cancer Father 61    prostate ca  . Diabetes Father   . Heart disease Father     CABG  . Diabetes Sister   . Hypertension Brother y-10  . Heart disease Brother     CABG  . Diabetes Brother   . Breast cancer Maternal Aunt     dx in her 68s  . Lymphoma Paternal Aunt    . Brain cancer Paternal Grandfather   . Ovarian cancer Other     The patient has one daughter and a maternal half brother who are cancer free.  The patient's mother was diagnosed with stomach cancer at age 60.  Her mother had two sisters, one who had breast cancer in her 73s.  The patient's maternal grandmother's mother died of ovarian cancer.  The patient's father was diagnosed with prostate cancer at age 60.  He had one sister who had lymphoma, and his father died of brain cancer at an unknown age.  There is no other reported history of cancer.  Patient's maternal ancestors are of English descent, and paternal ancestors are of Scotch-Irish descent. There  is no reported Ashkenazi Jewish ancestry. There is no known consanguinity.  GENETIC COUNSELING ASSESSMENT: Aino Heckert is a 60 y.o. female with a personal and family history of cancer which is somewhat suggestive of a hereditary cancer syndrome and predisposition to cancer. We, therefore, discussed and recommended the following at today's visit.   DISCUSSION: We discussed that about 15-20% of ovarian cancer, and 5-10% of breast cancer have a hereditary cause, most commonly due to BRCA mutations.  The second most common cause of ovarian cancer is due to Lynch syndrome.  We reviewed the characteristics, features and inheritance patterns of hereditary cancer syndromes. We also discussed genetic testing, including the appropriate family members to test, the process of testing, insurance coverage and turn-around-time for results. We discussed the implications of a negative, positive and/or variant of uncertain significant result. We recommended [name] pursue genetic testing for the hereditary cancer panel gene panel. The Hereditary Gene Panel offered by [facility] includes sequencing and/or deletion duplication testing of the following 42 genes: APC, ATM, AXIN2, BARD1, BMPR1A, BRCA1, BRCA2, BRIP1, CDH1, CDKN2A, CHEK2, DICER1, EPCAM, GREM1, KIT, MEN1, MLH1,  MSH2, MSH6, MUTYH, NBN, NF1, PALB2, PDGFRA, PMS2, POLD1, POLE, PTEN, RAD50, RAD51C, RAD51D, SDHA, SDHB, SDHC, SDHD, SMAD4, SMARCA4. STK11, TP53, TSC1, TSC2, and VHL.    Based on [name]'s personal and family history of cancer, she meets medical criteria for genetic testing. Despite that she meets criteria, she may still have an out of pocket cost. We discussed that if her out of pocket cost for testing is over $100, the laboratory will call and confirm whether she wants to proceed with testing.  If the out of pocket cost of testing is less than $100 she will be billed by the genetic testing laboratory.   PLAN: After considering the risks, benefits, and limitations, [name]  provided informed consent to pursue genetic testing and the blood sample was sent to [facility] for analysis of the Hereditary cancer panel. Results should be available within approximately 2-3 weeks' time, at which point they will be disclosed by telephone to [name], as will any additional recommendations warranted by these results. [name] will receive a summary of her genetic counseling visit and a copy of her results once available. This information will also be available in Epic. We encouraged [name] to remain in contact with cancer genetics annually so that we can continuously update the family history and inform her of any changes in cancer genetics and testing that may be of benefit for her family. [name]'s questions were answered to her satisfaction today. Our contact information was provided should additional questions or concerns arise.  Lastly, we encouraged [name] to remain in contact with cancer genetics annually so that we can continuously update the family history and inform her of any changes in cancer genetics and testing that may be of benefit for this family.   Ms.  [name]'s questions were answered to her satisfaction today. Our contact information was provided should additional  questions or concerns arise. Thank you for the referral and allowing [place] to share in the care of your patient.   Karen P. Florene Glen, [place], [facility] Certified Genetic Counselor [email or web address] phone: [phone]  The patient was seen for a total of 60 minutes in face-to-face genetic counseling.  This patient was discussed with Drs. Magrinat, Lindi Adie and/or Burr Medico who agrees with the above.    _______________________________________________________________________ For Office Staff:  Number of people involved in session: 1 Was an Intern/ student involved  with case: no

## 2015-11-06 ENCOUNTER — Other Ambulatory Visit: Payer: BLUE CROSS/BLUE SHIELD

## 2015-11-06 ENCOUNTER — Ambulatory Visit: Payer: BLUE CROSS/BLUE SHIELD

## 2015-11-10 ENCOUNTER — Encounter: Payer: Self-pay | Admitting: *Deleted

## 2015-11-10 ENCOUNTER — Telehealth: Payer: Self-pay | Admitting: Hematology and Oncology

## 2015-11-10 DIAGNOSIS — Z933 Colostomy status: Secondary | ICD-10-CM | POA: Diagnosis not present

## 2015-11-10 NOTE — Progress Notes (Signed)
Prior authorization obtained for neupogen  Case ID [id number]. Coverage date 10/11/15 - 05/08/16. [facility] will coordinate delivery

## 2015-11-10 NOTE — Telephone Encounter (Signed)
s.w. pt and changed appt time per MD request due to being out of the office

## 2015-11-11 DIAGNOSIS — Z933 Colostomy status: Secondary | ICD-10-CM | POA: Diagnosis not present

## 2015-11-11 IMAGING — DX DG CHEST 1V PORT
1 series · 1 of 1 positions shown · non-contrast
Comparison: 08/25/2014

CLINICAL DATA: Endotracheal tube position

EXAM:
PORTABLE CHEST - 1 VIEW

[chest ap]
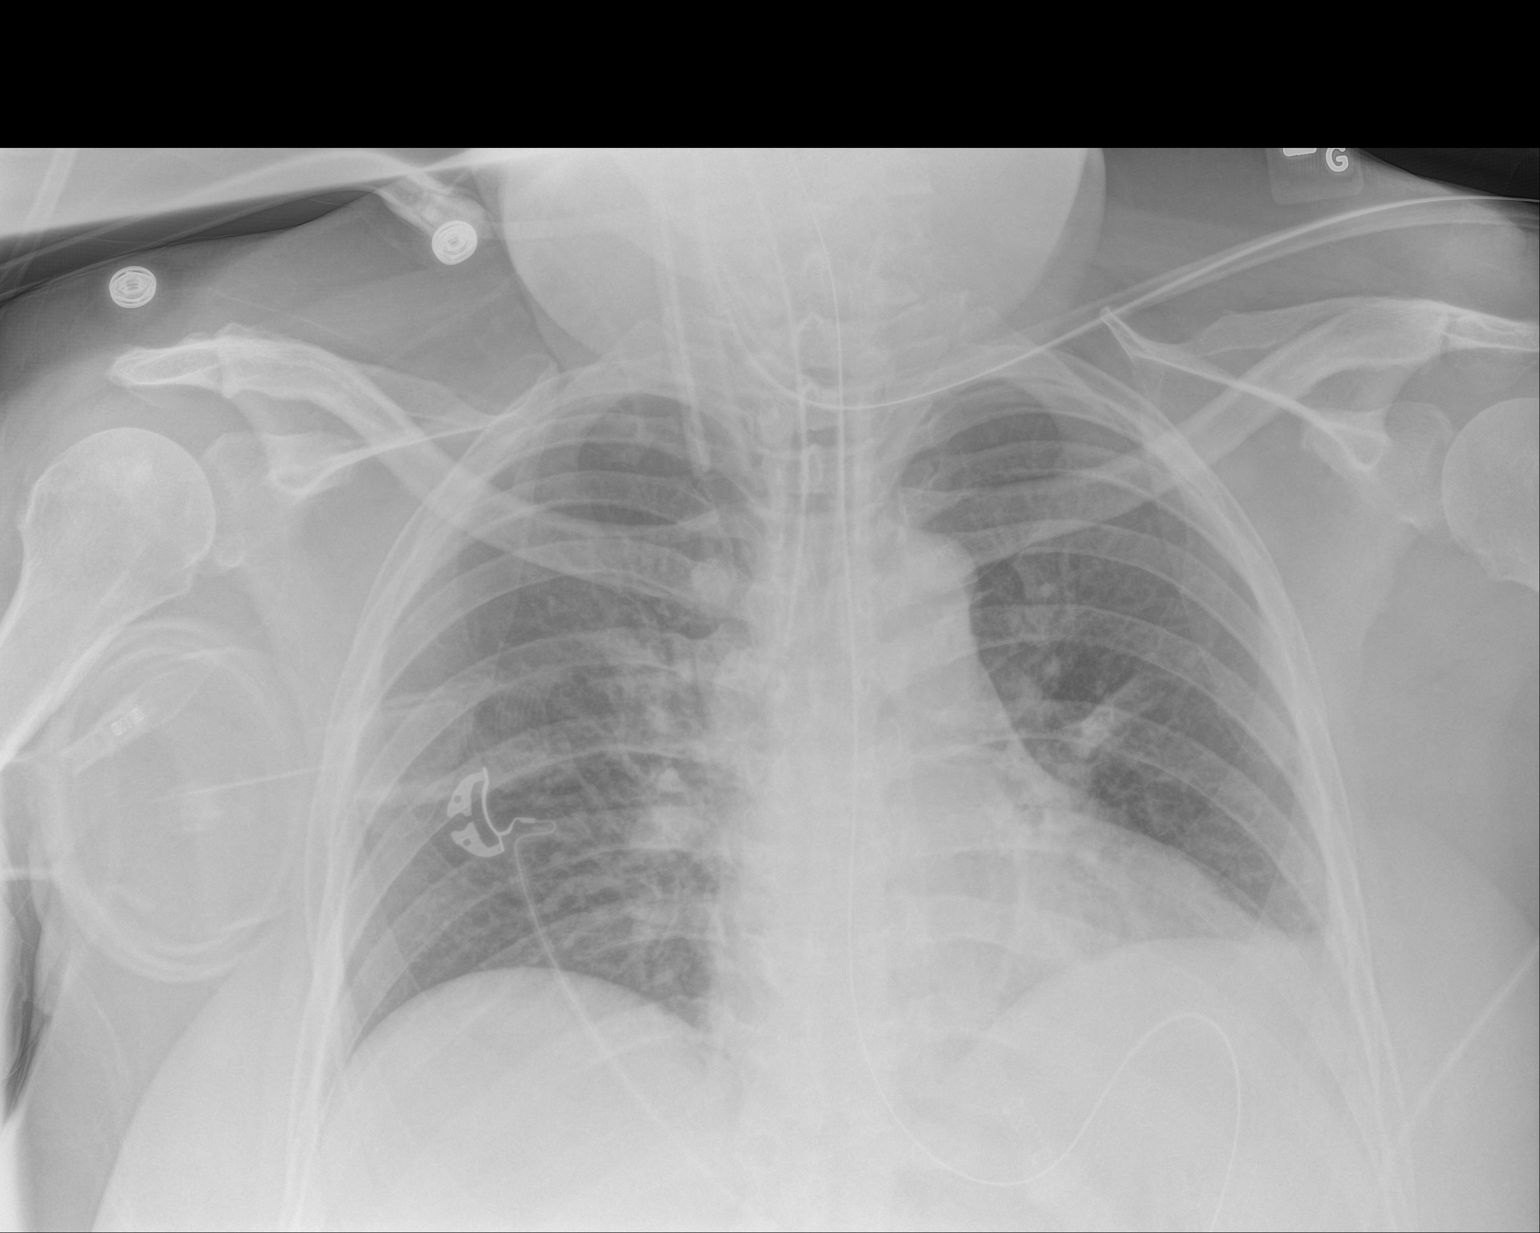

[1 of 1 positions shown; findings below may reference images not displayed]

FINDINGS: There is an endotracheal tube with tip between the clavicular heads
and carina (tip 18 mm above the carina). There is hypoventilation
with interstitial crowding. No edema, effusion, or air leak. Normal
heart size and aortic contours.
IMPRESSION: 1. The endotracheal tube is in good position.
2. Hypoventilation.

## 2015-11-12 ENCOUNTER — Other Ambulatory Visit: Payer: Self-pay | Admitting: Hematology and Oncology

## 2015-11-13 ENCOUNTER — Encounter: Payer: Self-pay | Admitting: *Deleted

## 2015-11-13 ENCOUNTER — Ambulatory Visit: Payer: BLUE CROSS/BLUE SHIELD

## 2015-11-13 ENCOUNTER — Other Ambulatory Visit: Payer: BLUE CROSS/BLUE SHIELD

## 2015-11-13 ENCOUNTER — Ambulatory Visit: Payer: BLUE CROSS/BLUE SHIELD | Admitting: Hematology and Oncology

## 2015-11-13 ENCOUNTER — Telehealth: Payer: Self-pay | Admitting: Hematology and Oncology

## 2015-11-13 ENCOUNTER — Encounter: Payer: Self-pay | Admitting: Hematology and Oncology

## 2015-11-13 ENCOUNTER — Ambulatory Visit (HOSPITAL_BASED_OUTPATIENT_CLINIC_OR_DEPARTMENT_OTHER): Payer: BLUE CROSS/BLUE SHIELD | Admitting: Hematology and Oncology

## 2015-11-13 ENCOUNTER — Other Ambulatory Visit (HOSPITAL_BASED_OUTPATIENT_CLINIC_OR_DEPARTMENT_OTHER): Payer: BLUE CROSS/BLUE SHIELD

## 2015-11-13 VITALS — BP 104/52 | HR 77 | Temp 97.6°F | Resp 20 | Ht 62.5 in | Wt 219.4 lb

## 2015-11-13 DIAGNOSIS — N1330 Unspecified hydronephrosis: Secondary | ICD-10-CM | POA: Diagnosis not present

## 2015-11-13 DIAGNOSIS — D709 Neutropenia, unspecified: Secondary | ICD-10-CM | POA: Diagnosis not present

## 2015-11-13 DIAGNOSIS — C549 Malignant neoplasm of corpus uteri, unspecified: Secondary | ICD-10-CM | POA: Insufficient documentation

## 2015-11-13 DIAGNOSIS — Z8542 Personal history of malignant neoplasm of other parts of uterus: Secondary | ICD-10-CM | POA: Diagnosis not present

## 2015-11-13 DIAGNOSIS — C50212 Malignant neoplasm of upper-inner quadrant of left female breast: Secondary | ICD-10-CM | POA: Diagnosis not present

## 2015-11-13 DIAGNOSIS — Z8543 Personal history of malignant neoplasm of ovary: Secondary | ICD-10-CM

## 2015-11-13 LAB — CBC & DIFF AND RETIC
BASO%: 1.6 % (ref 0.0–2.0)
BASOS ABS: 0 10*3/uL (ref 0.0–0.1)
EOS ABS: 0.1 10*3/uL (ref 0.0–0.5)
EOS%: 3.8 % (ref 0.0–7.0)
HCT: 38.2 % (ref 34.8–46.6)
HEMOGLOBIN: 12.3 g/dL (ref 11.6–15.9)
IMMATURE RETIC FRACT: 10.8 % — AB (ref 1.60–10.00)
LYMPH#: 0.7 10*3/uL — AB (ref 0.9–3.3)
LYMPH%: 39.8 % (ref 14.0–49.7)
MCH: 28 pg (ref 25.1–34.0)
MCHC: 32.2 g/dL (ref 31.5–36.0)
MCV: 87 fL (ref 79.5–101.0)
MONO#: 0.3 10*3/uL (ref 0.1–0.9)
MONO%: 18.3 % — ABNORMAL HIGH (ref 0.0–14.0)
NEUT#: 0.7 10*3/uL — ABNORMAL LOW (ref 1.5–6.5)
NEUT%: 36.5 % — ABNORMAL LOW (ref 38.4–76.8)
NRBC: 0 % (ref 0–0)
Platelets: 232 10*3/uL (ref 145–400)
RBC: 4.39 10*6/uL (ref 3.70–5.45)
RDW: 15.4 % — AB (ref 11.2–14.5)
RETIC %: 1.38 % (ref 0.70–2.10)
RETIC CT ABS: 60.58 10*3/uL (ref 33.70–90.70)
WBC: 1.9 10*3/uL — ABNORMAL LOW (ref 3.9–10.3)

## 2015-11-13 NOTE — Progress Notes (Signed)
[facility] OFFICE PROGRESS NOTE  Patient Care Team: Yvonne R Lowne Chase, DO as PCP - General (Family Medicine) Neymar Dowe, MD as Consulting Physician (Hematology and Oncology) Emma Rossi, MD as Consulting Physician (Obstetrics and Gynecology) Steven Gross, MD as Consulting Physician (General Surgery) Theodore Manny, MD as Consulting Physician (Urology) Michael Altheimer, MD as Consulting Physician (Endocrinology) Graham Lyles, MD as Consulting Physician (Ophthalmology) Faera Byerly, MD as Consulting Physician (General Surgery) Brinda Thimmappa, MD as Consulting Physician (Plastic Surgery)  SUMMARY OF ONCOLOGIC HISTORY:   History of ovarian & endometrial cancer   10/15/2004 Initial Diagnosis History of ovarian cancer, treated with chemotherapy carbo/Taxol   03/17/2009 Bone Marrow Biopsy Bone marrow biopsy at [facility] [place] showed neutropenia   01/23/2014 Bone Marrow Biopsy Repeat bone marrow biopsy showed neutropenia   09/12/2014 Imaging CT scan showed possible cancer   10/14/2014 Imaging MRI show significant pelvic mass without invasion into the bladder but abutting to the rectum   11/19/2014 Surgery she underwent surgery and had robotic-assisted lysis of adhesions, converted to laparotomy, radical upper vaginectomy and low anterior resection with colostomy. 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 Resection margins were negative   01/29/2015 - 03/10/2015 Radiation Therapy She received adjuvant radiation   03/03/2015 Imaging CT scan of the abdomen and pelvis showed status post interval removal of the bilateral nephroureteral stents. Worsening moderate right hydroureteronephrosis. Resolved left hydroureteronephrosis.   03/20/2015 Procedure she had cystoscopy and stent placement for right  hydronephrosis   05/02/2015 Surgery Cystoscopy with right retrograde pyelogram interpretation. Diagnostic ureteroscopy. Removal of right ureteral stent.   05/14/2015 Imaging CT scan of the abdomen and pelvis show unilateral right hydronephrosis with no residual cancer   08/18/2015 Imaging CT scan showed hysterectomy with stable presacral soft tissue thickening. No definitive evidence of recurrent or metastatic disease. Severe right hydronephrosis   09/12/2015 Surgery Cystoscopy with right retrograde pyelogram interpretation. Right ureteral stent placement 5 x 24 Polaris, no tether    Breast cancer of upper-inner quadrant of left female breast (HCC)   10/14/2015 Imaging Screening mammogram showed possible distortion in the left breast.   10/24/2015 Imaging Targeted ultrasound is performed, showing a 0.6 x 0.8 x 0.9 cm area of hypoechoic distortion at the 10 o'clock position of the left breast 5 cm from the nipple   10/24/2015 Imaging Diagnostic imaging confirmed 0.6 x 0.8 x 0.9 cm distortion in the upper inner left breast   10/30/2015 Procedure Left US guided biopsy was performed   10/30/2015 Pathology Results Accession: [id number] showed invasive ductal carcinoma, ER/PR positive, Her2 neg   INTERVAL HISTORY: Please see below for problem oriented charting. She returns for further follow-up. She is anxious regarding upcoming surgery. She complained of intermittent right flank discomfort but denies fever, chills and dysuria. She noticed increased cast in the urine. She has appointment pending to see urologist. She denies recent infection  REVIEW OF SYSTEMS:   Constitutional: Denies fevers, chills or abnormal weight loss Eyes: Denies blurriness of vision Ears, nose, mouth, throat, and face: Denies mucositis or sore throat Respiratory: Denies cough, dyspnea or wheezes Cardiovascular: Denies palpitation, chest discomfort or lower extremity swelling Gastrointestinal:  Denies nausea, heartburn or change in  bowel habits Skin: Denies abnormal skin rashes Lymphatics: Denies new lymphadenopathy or easy bruising Neurological:Denies numbness, tingling or new weaknesses Behavioral/Psych: Mood is stable, no new changes  All other systems   were reviewed with the patient and are negative.  I have reviewed the past medical history, past surgical history, social history and family history with the patient and they are unchanged from previous note.  ALLERGIES:  is allergic to penicillins; ultram; adhesive; cefaclor; erythromycin; trimethoprim; pectin; and sulfa antibiotics.  MEDICATIONS:  Current Outpatient Prescriptions  Medication Sig Dispense Refill  . Biotin 5 MG TABS Take 1 tablet by mouth every morning.     . Canagliflozin (INVOKANA) 300 MG TABS Take 1 tablet by mouth every morning.     . Cholecalciferol (VITAMIN D3) 10000 UNITS capsule Take 10,000 Units by mouth once a week. Sundays    . diphenhydrAMINE (BENADRYL) 25 MG tablet Take 25 mg by mouth every 4 (four) hours as needed for itching or allergies. Reported on 09/15/2015    . fluticasone (FLOVENT HFA) 110 MCG/ACT inhaler Inhale 1 puff into the lungs as needed (to rash as directed.). Reported on 09/15/2015    . hyaluronate sodium (RADIAPLEXRX) GEL Apply 1 application topically 2 (two) times daily as needed (scars). Reported on 09/15/2015    . levothyroxine (SYNTHROID) 175 MCG tablet Take 175 mcg by mouth daily before breakfast.    . loratadine (CLARITIN) 10 MG tablet Take 10 mg by mouth daily.    . metFORMIN (GLUCOPHAGE) 1000 MG tablet Take 1,000 mg by mouth 2 (two) times daily with a meal.     . Multiple Vitamin (MULTIVITAMIN WITH MINERALS) TABS tablet Take 1 tablet by mouth daily.    . NEUPOGEN 480 MCG/1.6ML injection INJECT 1.6 ML (480 MCG TOTAL) UNDER THE SKIN ONCE A WEEK 6.4 mL 1  . omega-3 acid ethyl esters (LOVAZA) 1 G capsule Take 1 g by mouth 2 (two) times daily.    . omeprazole (PRILOSEC) 20 MG capsule Take 20 mg by mouth at bedtime.      . Polyethyl Glycol-Propyl Glycol (SYSTANE OP) Apply 1 drop to eye at bedtime.     . rosuvastatin (CRESTOR) 10 MG tablet Take 10 mg by mouth every evening.     . sitaGLIPtin (JANUVIA) 100 MG tablet Take 100 mg by mouth daily.    . valsartan (DIOVAN) 320 MG tablet Take 320 mg by mouth every morning.      No current facility-administered medications for this visit.    PHYSICAL EXAMINATION: ECOG PERFORMANCE STATUS: 1 - Symptomatic but completely ambulatory  Filed Vitals:   11/13/15 0858  BP: 104/52  Pulse: 77  Temp: 97.6 F (36.4 C)  Resp: 20   Filed Weights   11/13/15 0858  Weight: 219 lb 6.4 oz (99.519 kg)    GENERAL:alert, no distress and comfortable SKIN: skin color, texture, turgor are normal, no rashes or significant lesions EYES: normal, Conjunctiva are pink and non-injected, sclera clear Musculoskeletal:no cyanosis of digits and no clubbing  NEURO: alert & oriented x 3 with fluent speech, no focal motor/sensory deficits  LABORATORY DATA:  I have reviewed the data as listed    Component Value Date/Time   NA 138 10/08/2015 1409   NA 140 08/28/2015 1617   K 4.0 10/08/2015 1409   K 3.7 08/28/2015 1617   CL 102 08/28/2015 1617   CO2 27 10/08/2015 1409   CO2 28 08/28/2015 1617   GLUCOSE 78 10/08/2015 1409   GLUCOSE 77 08/28/2015 1617   BUN 20.5 10/08/2015 1409   BUN 25* 08/28/2015 1617   CREATININE 1.3* 10/08/2015 1409   CREATININE 1.11 08/28/2015 1617   CREATININE 0.80 01/06/2015 1610   CALCIUM 9.3   10/08/2015 1409   CALCIUM 9.6 08/28/2015 1617   PROT 7.5 10/08/2015 1409   PROT 7.1 08/28/2015 1617   ALBUMIN 3.4* 10/08/2015 1409   ALBUMIN 4.1 08/28/2015 1617   AST 19 10/08/2015 1409   AST 17 08/28/2015 1617   ALT 27 10/08/2015 1409   ALT 22 08/28/2015 1617   ALKPHOS 89 10/08/2015 1409   ALKPHOS 79 08/28/2015 1617   BILITOT 0.41 10/08/2015 1409   BILITOT 0.4 08/28/2015 1617   GFRNONAA >60 01/02/2015 0400   GFRNONAA 78 12/16/2014 1530   GFRAA >60  01/02/2015 0400   GFRAA >89 12/16/2014 1530    No results found for: SPEP, UPEP  Lab Results  Component Value Date   WBC 1.9* 11/13/2015   NEUTROABS 0.7* 11/13/2015   HGB 12.3 11/13/2015   HCT 38.2 11/13/2015   MCV 87.0 11/13/2015   PLT 232 11/13/2015      Chemistry      Component Value Date/Time   NA 138 10/08/2015 1409   NA 140 08/28/2015 1617   K 4.0 10/08/2015 1409   K 3.7 08/28/2015 1617   CL 102 08/28/2015 1617   CO2 27 10/08/2015 1409   CO2 28 08/28/2015 1617   BUN 20.5 10/08/2015 1409   BUN 25* 08/28/2015 1617   CREATININE 1.3* 10/08/2015 1409   CREATININE 1.11 08/28/2015 1617   CREATININE 0.80 01/06/2015 1610      Component Value Date/Time   CALCIUM 9.3 10/08/2015 1409   CALCIUM 9.6 08/28/2015 1617   ALKPHOS 89 10/08/2015 1409   ALKPHOS 79 08/28/2015 1617   AST 19 10/08/2015 1409   AST 17 08/28/2015 1617   ALT 27 10/08/2015 1409   ALT 22 08/28/2015 1617   BILITOT 0.41 10/08/2015 1409   BILITOT 0.4 08/28/2015 1617      ASSESSMENT & PLAN:   Breast cancer of upper-inner quadrant of left female breast (HCC) We discussed preoperative assessment. I support her decision for bilateral mastectomy and reconstruction surgery. With strongly estrogen receptor positive breast cancer, she will unlikely need adjuvant chemotherapy in the new future. I will bring her back to get her blood work checked frequently over the next few weeks and will titrate the frequency of Neupogen injection. I would aim to keep her ANC greater than 1.5 consistently before her surgery.  Chronic neutropenia Due to her history of recurrent infection, I recommend we titrate the dosage of Neupogen to keep a consistently greater than 1.5 in the perioperative studding. Her ANC is low today but she has not given herself the morning dose. I recommend she returns on 11/17/2015 for repeat blood count. I will review test results with her on a consistent basis and will adjust her treatment dose as  needed  Hydronephrosis, right She had urinary stents placement for right hydronephrosis. She complained of intermittent flank pain. She denies dysuria, fever or chills. She noted some increased cast in her urine. She has appointment to see urologist next week for assessment. I would defer to them for further management. As above, I will monitor her white blood cell count carefully and will titrate the Neupogen dose to keep it consistently greater than 1.5 to reduce her risks of recurrent infection  History of ovarian & endometrial cancer She is doing well. Recent imaging study in January showed no evidence of cancer. Per recommendation from GYN, she will have repeat imaging study around August, 2017.     All questions were answered. The patient knows to call the clinic with any problems,   questions or concerns. No barriers to learning was detected. I spent 20 minutes counseling the patient face to face. The total time spent in the appointment was 25 minutes and more than 50% was on counseling and review of test results     , , MD 11/13/2015 9:53 AM    

## 2015-11-13 NOTE — Assessment & Plan Note (Signed)
She is doing well. Recent imaging study in January showed no evidence of cancer. Per recommendation from GYN, she will have repeat imaging study around August, 2017.

## 2015-11-13 NOTE — Assessment & Plan Note (Signed)
She had urinary stents placement for right hydronephrosis. She complained of intermittent flank pain. She denies dysuria, fever or chills. She noted some increased cast in her urine. She has appointment to see urologist next week for assessment. I would defer to them for further management. As above, I will monitor her white blood cell count carefully and will titrate the Neupogen dose to keep it consistently greater than 1.5 to reduce her risks of recurrent infection

## 2015-11-13 NOTE — Assessment & Plan Note (Signed)
Due to her history of recurrent infection, I recommend we titrate the dosage of Neupogen to keep a consistently greater than 1.5 in the perioperative studding. Her ANC is low today but she has not given herself the morning dose. I recommend she returns on 11/17/2015 for repeat blood count. I will review test results with her on a consistent basis and will adjust her treatment dose as needed

## 2015-11-13 NOTE — Telephone Encounter (Signed)
Gave pt appt for April & June & avs

## 2015-11-13 NOTE — Assessment & Plan Note (Signed)
We discussed preoperative assessment. I support her decision for bilateral mastectomy and reconstruction surgery. With strongly estrogen receptor positive breast cancer, she will unlikely need adjuvant chemotherapy in the new future. I will bring her back to get her blood work checked frequently over the next few weeks and will titrate the frequency of Neupogen injection. I would aim to keep her [place] greater than 1.5 consistently before her surgery.

## 2015-11-17 ENCOUNTER — Other Ambulatory Visit (HOSPITAL_BASED_OUTPATIENT_CLINIC_OR_DEPARTMENT_OTHER): Payer: BLUE CROSS/BLUE SHIELD

## 2015-11-17 ENCOUNTER — Other Ambulatory Visit: Payer: Self-pay | Admitting: Hematology and Oncology

## 2015-11-17 ENCOUNTER — Telehealth: Payer: Self-pay | Admitting: *Deleted

## 2015-11-17 ENCOUNTER — Telehealth: Payer: Self-pay | Admitting: Hematology and Oncology

## 2015-11-17 DIAGNOSIS — C801 Malignant (primary) neoplasm, unspecified: Secondary | ICD-10-CM | POA: Diagnosis not present

## 2015-11-17 DIAGNOSIS — IMO0002 Reserved for concepts with insufficient information to code with codable children: Secondary | ICD-10-CM

## 2015-11-17 DIAGNOSIS — R300 Dysuria: Secondary | ICD-10-CM | POA: Diagnosis not present

## 2015-11-17 DIAGNOSIS — D709 Neutropenia, unspecified: Secondary | ICD-10-CM

## 2015-11-17 DIAGNOSIS — S3720XA Unspecified injury of bladder, initial encounter: Secondary | ICD-10-CM | POA: Diagnosis not present

## 2015-11-17 DIAGNOSIS — Z0000 Encounter for general adult medical examination without abnormal findings: Secondary | ICD-10-CM | POA: Diagnosis not present

## 2015-11-17 DIAGNOSIS — N135 Crossing vessel and stricture of ureter without hydronephrosis: Secondary | ICD-10-CM | POA: Diagnosis not present

## 2015-11-17 DIAGNOSIS — C7960 Secondary malignant neoplasm of unspecified ovary: Secondary | ICD-10-CM | POA: Diagnosis not present

## 2015-11-17 LAB — CBC & DIFF AND RETIC
BASO%: 1.2 % (ref 0.0–2.0)
Basophils Absolute: 0 10*3/uL (ref 0.0–0.1)
EOS%: 4 % (ref 0.0–7.0)
Eosinophils Absolute: 0.1 10*3/uL (ref 0.0–0.5)
HCT: 35.3 % (ref 34.8–46.6)
HEMOGLOBIN: 11.3 g/dL — AB (ref 11.6–15.9)
Immature Retic Fract: 7 % (ref 1.60–10.00)
LYMPH%: 33.6 % (ref 14.0–49.7)
MCH: 27.7 pg (ref 25.1–34.0)
MCHC: 32 g/dL (ref 31.5–36.0)
MCV: 86.5 fL (ref 79.5–101.0)
MONO#: 0.9 10*3/uL (ref 0.1–0.9)
MONO%: 34 % — AB (ref 0.0–14.0)
NEUT%: 27.2 % — ABNORMAL LOW (ref 38.4–76.8)
NEUTROS ABS: 0.7 10*3/uL — AB (ref 1.5–6.5)
Platelets: 292 10*3/uL (ref 145–400)
RBC: 4.08 10*6/uL (ref 3.70–5.45)
RDW: 15.2 % — ABNORMAL HIGH (ref 11.2–14.5)
Retic %: 0.91 % (ref 0.70–2.10)
Retic Ct Abs: 37.13 10*3/uL (ref 33.70–90.70)
WBC: 2.5 10*3/uL — AB (ref 3.9–10.3)
lymph#: 0.8 10*3/uL — ABNORMAL LOW (ref 0.9–3.3)

## 2015-11-17 LAB — URINALYSIS, MICROSCOPIC - CHCC
Bilirubin (Urine): NEGATIVE
GLUCOSE UR CHCC: 2000 mg/dL
KETONES: NEGATIVE mg/dL
Nitrite: NEGATIVE
PROTEIN: 30 mg/dL
SPECIFIC GRAVITY, URINE: 1.015 (ref 1.003–1.035)
Urobilinogen, UR: 0.2 mg/dL (ref 0.2–1)
pH: 6 (ref 4.6–8.0)

## 2015-11-17 NOTE — Telephone Encounter (Signed)
Informed pt of CBC results and Dr. Calton Dach message below.  She verbalized understanding and also reports chills w/ flank pain over the weekend.  She gave a urine specimen at the lab today and asks if Dr. Alvy Bimler will order U/A?  Dr. Alvy Bimler aware and u/a and culture ordered.  Will call pt w/ results when available. Also informed pt to expect  Call from [place] to add lab appt for this Friday.  She verbalized understanding.

## 2015-11-17 NOTE — Telephone Encounter (Signed)
per pof to sch pt appt-gave pt copy of avs °

## 2015-11-17 NOTE — Telephone Encounter (Signed)
-----   Message from Heath Lark, MD sent at 11/17/2015  1:36 PM EDT ----- Regarding: CBC PLs let her know her neutrophil is still low. She should proceed with another Neupogen dose today and I have placed order to get her labs rechecked again on Friday to reassess. She may run out of Neupogen sooner. May need new prior auth ----- Message -----    From: Lab in [facility] Interface    Sent: 11/17/2015   1:23 PM      To: Heath Lark, MD

## 2015-11-18 ENCOUNTER — Other Ambulatory Visit: Payer: Self-pay | Admitting: Hematology and Oncology

## 2015-11-18 ENCOUNTER — Other Ambulatory Visit: Payer: Self-pay | Admitting: Urology

## 2015-11-18 ENCOUNTER — Encounter (HOSPITAL_COMMUNITY): Payer: Self-pay | Admitting: *Deleted

## 2015-11-18 LAB — URINE CULTURE

## 2015-11-18 NOTE — Progress Notes (Signed)
Patient states Dr Alvy Bimler had her give herself an extra neupogen injection on 11/17/2015 due to lab values.

## 2015-11-18 NOTE — Progress Notes (Signed)
Patient was supposed to have labs drawn at [facility] on 11/21/15.  I told her I would get in touch with Dr Alvy Bimler or nurse and see what labs they would want on 11/21/15 and let them know at preop on 11/21/15 we would be obtaining a CBC and BMP prior to surgery.

## 2015-11-19 ENCOUNTER — Other Ambulatory Visit: Payer: Self-pay | Admitting: Hematology and Oncology

## 2015-11-19 ENCOUNTER — Telehealth: Payer: Self-pay | Admitting: *Deleted

## 2015-11-19 DIAGNOSIS — D709 Neutropenia, unspecified: Secondary | ICD-10-CM

## 2015-11-19 DIAGNOSIS — C50212 Malignant neoplasm of upper-inner quadrant of left female breast: Secondary | ICD-10-CM | POA: Diagnosis not present

## 2015-11-19 MED ORDER — FILGRASTIM 480 MCG/1.6ML IJ SOLN: INTRAMUSCULAR | Status: DC

## 2015-11-19 NOTE — Progress Notes (Signed)
Dr Alvy Bimler nurse returned call and asked if we could get a Diff on labs on 11/21/2015 in addition to the CBC. I told her I would enter that order and she stated she would call the patient and tell her we had spoken and tell the patient her lab appt at [facility] would be cancelled for 11/21/15.

## 2015-11-19 NOTE — Telephone Encounter (Signed)
-----   Message from Heath Lark, MD sent at 11/19/2015  7:43 AM EDT ----- Regarding: urine test This is inconclusive for UTI I would not recommend antibiotics If she has symptoms of dysuria would consider Pyridium ----- Message -----    From: [facility]    Sent: 11/17/2015   1:23 PM      To: Heath Lark, MD

## 2015-11-19 NOTE — Telephone Encounter (Signed)
Informed pt of Dr. Calton Dach message below.  Pt states understanding.  She continues to c/o a lot of discomfort in her flank and side. Dr. Tresa Moore is going to replace her stent this Friday.  Spoke w/ Angela Nevin in Pre surgical testing and they will draw labs including CBC w/ diff on Friday.   Canceled pt's lab appt here on Friday.   Pt aware.   Pt states will need new Rx on Neupogen sent to [facility] since she is taking a few extra doses they will not refill it early w/o a new Rx which reflects increased frequency of injections.   Pt asks for rx to be sent soon as it may require another Prior Auth w/ new Rx.

## 2015-11-19 NOTE — Telephone Encounter (Signed)
LVM for pt informing of Refill sent on Neupogen to [facility].

## 2015-11-19 NOTE — Telephone Encounter (Signed)
I placed electronic refill for 1 dose every 3 days

## 2015-11-19 NOTE — H&P (Signed)
Subjective:    Patient ID: [name] is a 60 y.o. female.  HPI  Here for follow up discussion breast reconstruction prior to bilateral mastectomies. Presented following screening MMG with left breast distortion. Diagnostic MGM and US showed a 0.6 x 0.8 x 0.9 cm area of distortion at the 10 o'clock position of the left breast 5 cm from the nipple. No abnormal left axillary lymph nodes identified. Biopsy with IDC/DCIS, ER/PR +, her 2 -.   PMH significant for ovarian cancer post TAH/BSO and adjuvant chemotherapy in 2006. 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 On chronic neupogen. Diagnosed with recurrence 2.2016 with invasion bladder and underwent robotic-assisted lysis of adhesions, converted to laparotomy, radical upper vaginectomy and low anterior resection with colostomy. Bilateral ureteral stent placement and cystotomy repair. 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 Completed adjuvant radiation pelvis. 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 Tresa Moore. Reports she has not had frequent UTI but often has "mixed flora" on culture without true infection. Has supply of Levaquin to take incase UTI symptoms develop. Had area of perforation at ostomy with leakage stool abdominal wall and developed sepsis and required revision ostomy. Had flank pain over last week, culture this week with mixed flora. Planning stent exchange this week.  Genetics pending.   Labs 4.1717 with WBC 2.5, Hb 11.3.1 Plt 292.  2.2017 Hb A1c 5.8  Current 44C cup, DD when at hightest wt. Does not desire to go any smaller. Highest Wt 290 lbm last year has lost 60 lb and still losing wt. Her cousin who has undergone implant based reconstruction.      Objective:   Physical Exam  Cardiovascular: Normal rate, regular rhythm and normal  heart sounds.  Pulmonary/Chest: Effort normal and breath sounds normal.  Abdominal: Soft.  + ostomy left UOQ  Genitourinary: No breast discharge.  Lymphadenopathy:  She has no axillary adenopathy.  Left >right volume, grade 3 ptosis bilateral, bilateral lateral chest wall redundant soft tissue rolls Left breast ecchymoses, no masses SN to nipple R 31 L 32 cm BW R 18 L 18 cm Nipple to IMF R 10 L 11 cm  Right chest scar from port    Assessment:     Left breast cancer History ovarian ca. Neutropenia chronic Morbid obesity    Plan:    Reviewed that she is overall high risk from reconstruction given her BMI, neutropenia. She is being managed with Neupogen in preparation for surgery. Reviewed my concerns for her especially with implant based reconstruction that her risk of UTIs, neutropenia place her at risk infection and sepsis, wound healing problems. With prosthetic material this may require additional surgery and possible removal implants.   Plan implant reconstruction with placement expander, possible acellular dermis. Reviewed process expansion, future surgery for implant placement, use of acellular dermis in reconstruction, cadaveric source. Reviewed hospital stay, OR length, drains, post procedure visits and limitations. Timing future surgery dependent on any adjuvant treatments. Reviewed implant specific risks including rupture, need for surveillance with silicone implants, contracture, infection requiring additional surgery or removal. I reinforced she is high risk and in the event of any concerns infection or wound healing problems that I would recommend removal devices. Additional risks including but not limited to bleeding, hematoma, seroma, damage to deeper structures, need for additional surgeries, mastectomy flap necrosis, DVT/PE, cardiopulmonary complications reviewed.   Irene Limbo, MD [facility] [phone]

## 2015-11-19 NOTE — Progress Notes (Signed)
Left voice mail message on phone of Dr Calton Dach nurse in regards to patient is having surgery on 11/21/15 here at [facility] with Dr Tresa Moore with time of 12 noon and patient to arrive at 0930.  Asked if they wanted to add any labs to preop labs of CBC and BMP ( already in EPIC) since patient was supposed to have labs with them on 11/21/15 and left them my name and phone number of [phone].

## 2015-11-21 ENCOUNTER — Telehealth: Payer: Self-pay | Admitting: Hematology and Oncology

## 2015-11-21 ENCOUNTER — Encounter (HOSPITAL_COMMUNITY)
Admission: RE | Disposition: A | Discharge status: Home / Self Care | Payer: Self-pay | Source: Ambulatory Visit | Attending: Urology

## 2015-11-21 ENCOUNTER — Other Ambulatory Visit: Payer: BLUE CROSS/BLUE SHIELD

## 2015-11-21 ENCOUNTER — Encounter (HOSPITAL_COMMUNITY): Payer: Self-pay

## 2015-11-21 ENCOUNTER — Other Ambulatory Visit: Payer: Self-pay | Admitting: *Deleted

## 2015-11-21 ENCOUNTER — Ambulatory Visit (HOSPITAL_COMMUNITY): Payer: BLUE CROSS/BLUE SHIELD | Admitting: Anesthesiology

## 2015-11-21 ENCOUNTER — Ambulatory Visit (HOSPITAL_COMMUNITY)
Admission: RE | Admit: 2015-11-21 | Discharge: 2015-11-21 | Disposition: A | Discharge status: Home / Self Care | Payer: BLUE CROSS/BLUE SHIELD | Source: Ambulatory Visit | Attending: Urology | Admitting: Urology

## 2015-11-21 DIAGNOSIS — E119 Type 2 diabetes mellitus without complications: Secondary | ICD-10-CM | POA: Insufficient documentation

## 2015-11-21 DIAGNOSIS — Z8544 Personal history of malignant neoplasm of other female genital organs: Secondary | ICD-10-CM | POA: Insufficient documentation

## 2015-11-21 DIAGNOSIS — Z9221 Personal history of antineoplastic chemotherapy: Secondary | ICD-10-CM | POA: Insufficient documentation

## 2015-11-21 DIAGNOSIS — Z923 Personal history of irradiation: Secondary | ICD-10-CM | POA: Diagnosis not present

## 2015-11-21 DIAGNOSIS — Z6838 Body mass index (BMI) 38.0-38.9, adult: Secondary | ICD-10-CM | POA: Insufficient documentation

## 2015-11-21 DIAGNOSIS — I10 Essential (primary) hypertension: Secondary | ICD-10-CM | POA: Diagnosis not present

## 2015-11-21 DIAGNOSIS — C50919 Malignant neoplasm of unspecified site of unspecified female breast: Secondary | ICD-10-CM | POA: Diagnosis not present

## 2015-11-21 DIAGNOSIS — Z8543 Personal history of malignant neoplasm of ovary: Secondary | ICD-10-CM | POA: Insufficient documentation

## 2015-11-21 DIAGNOSIS — N131 Hydronephrosis with ureteral stricture, not elsewhere classified: Secondary | ICD-10-CM | POA: Diagnosis not present

## 2015-11-21 DIAGNOSIS — Z8542 Personal history of malignant neoplasm of other parts of uterus: Secondary | ICD-10-CM | POA: Insufficient documentation

## 2015-11-21 DIAGNOSIS — Z85038 Personal history of other malignant neoplasm of large intestine: Secondary | ICD-10-CM | POA: Insufficient documentation

## 2015-11-21 DIAGNOSIS — K219 Gastro-esophageal reflux disease without esophagitis: Secondary | ICD-10-CM | POA: Diagnosis not present

## 2015-11-21 DIAGNOSIS — N135 Crossing vessel and stricture of ureter without hydronephrosis: Secondary | ICD-10-CM | POA: Diagnosis not present

## 2015-11-21 HISTORY — DX: Other specified postprocedural states: R11.2

## 2015-11-21 HISTORY — DX: Nausea with vomiting, unspecified: Z98.890

## 2015-11-21 HISTORY — PX: CYSTOSCOPY W/ URETERAL STENT PLACEMENT: SHX1429

## 2015-11-21 HISTORY — PX: CYSTOSCOPY W/ RETROGRADES: SHX1426

## 2015-11-21 LAB — GLUCOSE, CAPILLARY: Glucose-Capillary: 104 mg/dL — ABNORMAL HIGH (ref 65–99)

## 2015-11-21 LAB — CBC
HEMATOCRIT: 33.7 % — AB (ref 36.0–46.0)
Hemoglobin: 11 g/dL — ABNORMAL LOW (ref 12.0–15.0)
MCH: 27.3 pg (ref 26.0–34.0)
MCHC: 32.6 g/dL (ref 30.0–36.0)
MCV: 83.6 fL (ref 78.0–100.0)
Platelets: 318 10*3/uL (ref 150–400)
RBC: 4.03 MIL/uL (ref 3.87–5.11)
RDW: 14.9 % (ref 11.5–15.5)
WBC: 5 10*3/uL (ref 4.0–10.5)

## 2015-11-21 LAB — BASIC METABOLIC PANEL
Anion gap: 10 (ref 5–15)
BUN: 27 mg/dL — AB (ref 6–20)
CHLORIDE: 105 mmol/L (ref 101–111)
CO2: 24 mmol/L (ref 22–32)
Calcium: 9.3 mg/dL (ref 8.9–10.3)
Creatinine, Ser: 1.3 mg/dL — ABNORMAL HIGH (ref 0.44–1.00)
GFR calc Af Amer: 50 mL/min — ABNORMAL LOW (ref >60)
GFR calc non Af Amer: 43 mL/min — ABNORMAL LOW (ref >60)
GLUCOSE: 145 mg/dL — AB (ref 65–99)
POTASSIUM: 3.5 mmol/L (ref 3.5–5.1)
Sodium: 139 mmol/L (ref 135–145)

## 2015-11-21 LAB — DIFFERENTIAL
BASOS PCT: 0 %
Basophils Absolute: 0 10*3/uL (ref 0.0–0.1)
EOS ABS: 0.1 10*3/uL (ref 0.0–0.7)
Eosinophils Relative: 1 %
Lymphocytes Relative: 6 %
Lymphs Abs: 0.3 10*3/uL — ABNORMAL LOW (ref 0.7–4.0)
MONO ABS: 0.6 10*3/uL (ref 0.1–1.0)
MONOS PCT: 12 %
Neutro Abs: 4 10*3/uL (ref 1.7–7.7)
Neutrophils Relative %: 81 %

## 2015-11-21 SURGERY — CYSTOSCOPY, WITH RETROGRADE PYELOGRAM
Anesthesia: General | Site: Ureter | Laterality: Right

## 2015-11-21 MED ORDER — FENTANYL CITRATE (PF) 100 MCG/2ML IJ SOLN: 25.0000 ug | INTRAMUSCULAR | Status: DC | PRN

## 2015-11-21 MED ORDER — ONDANSETRON HCL 4 MG/2ML IJ SOLN
INTRAMUSCULAR | Status: DC | PRN
  Administered 2015-11-21: 4 mg via INTRAVENOUS

## 2015-11-21 MED ORDER — GENTAMICIN IN SALINE 1.6-0.9 MG/ML-% IV SOLN: 80.0000 mg | INTRAVENOUS | Status: DC

## 2015-11-21 MED ORDER — SENNOSIDES-DOCUSATE SODIUM 8.6-50 MG PO TABS: 1.0000 | ORAL_TABLET | Freq: Two times a day (BID) | ORAL | Status: DC

## 2015-11-21 MED ORDER — DEXAMETHASONE SODIUM PHOSPHATE 10 MG/ML IJ SOLN
INTRAMUSCULAR | Status: DC | PRN
  Administered 2015-11-21: 10 mg via INTRAVENOUS

## 2015-11-21 MED ORDER — LACTATED RINGERS IV SOLN
INTRAVENOUS | Status: DC | PRN
  Administered 2015-11-21: 12:00:00 via INTRAVENOUS

## 2015-11-21 MED ORDER — LIDOCAINE HCL (CARDIAC) 20 MG/ML IV SOLN
INTRAVENOUS | Status: DC | PRN
  Administered 2015-11-21: 60 mg via INTRATRACHEAL

## 2015-11-21 MED ORDER — LIDOCAINE HCL (CARDIAC) 20 MG/ML IV SOLN
INTRAVENOUS | Status: AC
  Filled 2015-11-21: qty 5

## 2015-11-21 MED ORDER — OXYCODONE-ACETAMINOPHEN 5-325 MG PO TABS: 1.0000 | ORAL_TABLET | Freq: Four times a day (QID) | ORAL | Status: DC | PRN

## 2015-11-21 MED ORDER — STERILE WATER FOR IRRIGATION IR SOLN
Status: DC | PRN
  Administered 2015-11-21: 3000 mL

## 2015-11-21 MED ORDER — 0.9 % SODIUM CHLORIDE (POUR BTL) OPTIME
TOPICAL | Status: DC | PRN
  Administered 2015-11-21: 1000 mL

## 2015-11-21 MED ORDER — FENTANYL CITRATE (PF) 100 MCG/2ML IJ SOLN
INTRAMUSCULAR | Status: AC
Start: 2015-11-21 — End: 2015-11-21
  Filled 2015-11-21: qty 2

## 2015-11-21 MED ORDER — IOPAMIDOL (ISOVUE-300) INJECTION 61%
INTRAVENOUS | Status: AC
  Filled 2015-11-21: qty 50

## 2015-11-21 MED ORDER — FENTANYL CITRATE (PF) 100 MCG/2ML IJ SOLN
INTRAMUSCULAR | Status: DC | PRN
  Administered 2015-11-21: 25 ug via INTRAVENOUS
  Administered 2015-11-21 (×2): 50 ug via INTRAVENOUS
  Administered 2015-11-21: 25 ug via INTRAVENOUS

## 2015-11-21 MED ORDER — FENTANYL CITRATE (PF) 100 MCG/2ML IJ SOLN
INTRAMUSCULAR | Status: AC
  Filled 2015-11-21: qty 2

## 2015-11-21 MED ORDER — DEXAMETHASONE SODIUM PHOSPHATE 10 MG/ML IJ SOLN
INTRAMUSCULAR | Status: AC
  Filled 2015-11-21: qty 1

## 2015-11-21 MED ORDER — GENTAMICIN SULFATE 40 MG/ML IJ SOLN
360.0000 mg | INTRAVENOUS | Status: AC
  Administered 2015-11-21: 360 mg via INTRAVENOUS
  Filled 2015-11-21: qty 9

## 2015-11-21 MED ORDER — ONDANSETRON HCL 4 MG/2ML IJ SOLN
INTRAMUSCULAR | Status: AC
Start: 2015-11-21 — End: 2015-11-21
  Filled 2015-11-21: qty 2

## 2015-11-21 MED ORDER — PROPOFOL 10 MG/ML IV BOLUS
INTRAVENOUS | Status: DC | PRN
  Administered 2015-11-21: 180 mg via INTRAVENOUS

## 2015-11-21 SURGICAL SUPPLY — 22 items
BAG URO CATCHER STRL LF (MISCELLANEOUS) ×2 IMPLANT
BASKET LASER NITINOL 1.9FR (BASKET) IMPLANT
BASKET ZERO TIP NITINOL 2.4FR (BASKET) IMPLANT
CATH INTERMIT  6FR 70CM (CATHETERS) ×2 IMPLANT
CLOTH BEACON ORANGE TIMEOUT ST (SAFETY) ×2 IMPLANT
FIBER LASER FLEXIVA 1000 (UROLOGICAL SUPPLIES) IMPLANT
FIBER LASER FLEXIVA 200 (UROLOGICAL SUPPLIES) IMPLANT
FIBER LASER FLEXIVA 365 (UROLOGICAL SUPPLIES) IMPLANT
FIBER LASER FLEXIVA 550 (UROLOGICAL SUPPLIES) IMPLANT
FIBER LASER TRAC TIP (UROLOGICAL SUPPLIES) IMPLANT
GLOVE BIOGEL M STRL SZ7.5 (GLOVE) ×2 IMPLANT
GOWN STRL REUS W/TWL LRG LVL3 (GOWN DISPOSABLE) ×4 IMPLANT
GUIDEWIRE ANG ZIPWIRE 038X150 (WIRE) IMPLANT
GUIDEWIRE STR DUAL SENSOR (WIRE) ×2 IMPLANT
IV NS 1000ML (IV SOLUTION) ×1
IV NS 1000ML BAXH (IV SOLUTION) ×1 IMPLANT
MANIFOLD NEPTUNE II (INSTRUMENTS) ×2 IMPLANT
PACK CYSTO (CUSTOM PROCEDURE TRAY) ×2 IMPLANT
STENT POLARIS 6FR 26CM .038 (STENTS) ×2 IMPLANT
SYR CONTROL 10ML LL (SYRINGE) IMPLANT
TUBE FEEDING 8FR 16IN STR KANG (MISCELLANEOUS) IMPLANT
TUBING CONNECTING 10 (TUBING) ×2 IMPLANT

## 2015-11-21 NOTE — Anesthesia Postprocedure Evaluation (Signed)
Anesthesia Post Note  Patient: Financial planner  Procedure(s) Performed: Procedure(s) (LRB): CYSTOSCOPY WITH RETROGRADE PYELOGRAM (Right) CYSTOSCOPY WITH STENT REPLACEMENT (Right)  Patient location during evaluation: PACU Anesthesia Type: General Level of consciousness: awake and alert Pain management: pain level controlled Vital Signs Assessment: post-procedure vital signs reviewed and stable Respiratory status: spontaneous breathing, nonlabored ventilation, respiratory function stable and patient connected to nasal cannula oxygen Cardiovascular status: blood pressure returned to baseline and stable Postop Assessment: no signs of nausea or vomiting Anesthetic complications: no    Last Vitals:  Filed Vitals:   11/21/15 1315 11/21/15 1330  BP: 98/49 93/50  Pulse: 77 75  Temp:    Resp: 17 17    Last Pain:  Filed Vitals:   11/21/15 1332  PainSc: 5                  Rachyl Wuebker S

## 2015-11-21 NOTE — H&P (Signed)
[name] is an 60 y.o. female.    Chief Complaint: Pre-op RIGHT ureteral stent exchange  HPI:    1 - Bladder Injury - s/p cystotomy repair and bilateral JJ stent placement by GYN oncology team 11/2014 at time of pelvic exenteration / end colostomy for metastatic GYN cancer. F/u imaging with CT 12/30/14 w/o bladder leak and stents in good position. Most recent Cr <1.5.  2 - Metastatic Endometrial Cancer - s/p repeat resection [phone] with colon and vaginal involvment but negative nodes / margins. Had adjuvant chemo-XRT which she is now done with. Follows with Dr. Alvy Bimler.   3 - Right Ureteral Stricture - New Rt hydro by CT 03/2015 to distal ureter on eval flank pain.   Recent Course: 03/2015 - Operative cysto / ureteroscopy c/w likley 2-3cm stricture (distal end about 3cm proximal to UO, balloon to 33F, 8x24 JJ stent placed)  04/2015  - Ureteroscopy patent ureter, JJ stent removed  08/2015 - CT Rt hydro to distal ureter (mildly increased)  no flank pain, Cr 1.2 ==> Rt 5x24 polaris stent 09/2015   PMH sig for DM2, morbid obesity, breast cancer, ovarian cancer, endometrial cancer, breast cancer. Her PCP is [name].  Today "[name]" is seen to proceed with right ureteral stent change. She has upcoming double mastectomy for breast cancer and would like not to have to deal with stent change in midst of her recovery from that. Most recent UCX last week non-clonal. NO interval fevers.   Past Medical History  Diagnosis Date  . Benign essential HTN 12/14/2011  . Multiple thyroid nodules     Managed by Dr. Harlow Asa  . GERD (gastroesophageal reflux disease)   . Depression   . Dysuria   . Radiation-induced dermatitis     contact dermatitis , radiation completed, rash only on ankles now.  . History of gastric polyp     2014  duodenum  . History of radiation therapy     01-29-2015 to 03-10-2015  pelvis 50.4Gy  . Hypothyroidism   . DM type 2 (diabetes mellitus, type 2) ([place])   . Hiatal hernia    . Anemia in neoplastic disease   . Chronic idiopathic neutropenia (HCC)     presumed related to chemotherapy March 2006--- followed by dr Alvy Bimler   . Ureteral stricture, right   . S/P colostomy ([place])   . Hydronephrosis   . History of renal stent   . Neutropenia ([place])   . Cancer of corpus uteri, except isthmus [facility]) oncologist-- dr Denman [name] and dr Alvy Bimler     Mar 2006 dx endometrial and ovarian cancer s/p  chemotheapy and surgery  . Breast cancer ([place]) 2017    Past Surgical History  Procedure Laterality Date  . Appendectomy    . Tonsillectomy    . Eye surgery      pytosis of eyelids-child  . Colonoscopy with propofol N/A 08/21/2013    Procedure: COLONOSCOPY WITH PROPOFOL;  Surgeon: Cleotis Nipper, MD;  Location: [facility];  Service: Endoscopy;  Laterality: N/A;  . Eus N/A 10/02/2014    Procedure: LOWER ENDOSCOPIC ULTRASOUND (EUS);  Surgeon: Arta Silence, MD;  Location: [facility];  Service: Endoscopy;  Laterality: N/A;  . Robotic assisted lap vaginal hysterectomy N/A 11/19/2014    Procedure: ROBOTIC LYSIS OF ADHESIONS, CONVERTED TO LAPAROTOMY RADICAL UPPER VAGINECTOMY,LOW ANTERIOR BOWEL RESECTION, COLOSTOMY, BILATERAL URETERAL STENT PLACEMENT AND CYSTONOMY CLOSURE;  Surgeon: Everitt Amber, MD;  Location: [facility];  Service: Gynecology;  Laterality: N/A;  . Ostomy N/A 11/19/2014  Procedure: OSTOMY;  Surgeon: Michael [name], MD;  Location: [facility];  Service: General;  Laterality: N/A;  . Colostomy takedown N/A 12/04/2014    Procedure: LAPROSCOPIC LYSIS OF ADHESIONS, SPLENIC MOBILIZATION, RELOCATION OF COLOSTOMY, DEBRIDEMENT INITIAL COLOSTOMY SITE;  Surgeon: Michael [name], MD;  Location: [facility];  Service: General;  Laterality: N/A;  . Cystoscopy with retrograde pyelogram, ureteroscopy and stent placement Right 03/20/2015    Procedure: CYSTOSCOPY WITH RETROGRADE PYELOGRAM, URETEROSCOPY WITH BALLOON DILATION AND STENT PLACEMENT ON RIGHT;  Surgeon: Alexis Frock, MD;  Location: [facility];  Service: Urology;  Laterality: Right;  . Excision soft tissue mass right foreman  12-08-2006  . Abdominal hysterectomy  2006    staging for Ovarian cancer  . Laparoscopic cholecystectomy    . Cystoscopy with retrograde pyelogram, ureteroscopy and stent placement Right 05/02/2015    Procedure: CYSTOSCOPY WITH RIGHT RETROGRADE PYELOGRAM,  DIAGNOSTIC URETEROSCOPY AND STENT PULL ;  Surgeon: Alexis Frock, MD;  Location: [facility];  Service: Urology;  Laterality: Right;  . Cystoscopy with retrograde pyelogram, ureteroscopy and stent placement Right 09/05/2015    Procedure: CYSTOSCOPY WITH RETROGRADE PYELOGRAM,  AND STENT PLACEMENT;  Surgeon: Alexis Frock, MD;  Location: [facility];  Service: Urology;  Laterality: Right;    Family History  Problem Relation Age of Onset  . Cancer Mother 45    stomach ca  . Hypertension Mother   . Cancer Father 32    prostate ca  . Diabetes Father   . Heart disease Father     CABG  . Diabetes Sister   . Hypertension Brother y-10  . Heart disease Brother     CABG  . Diabetes Brother   . Breast cancer Maternal Aunt     dx in her 72s  . Lymphoma Paternal Aunt   . Brain cancer Paternal Grandfather   . Ovarian cancer Other    Social History:  reports that she has never smoked. She has never used smokeless tobacco. She reports that she drinks alcohol. She reports that she does not use illicit drugs.  Allergies:  Allergies  Allergen Reactions  . Penicillins Swelling    Facial swelling Has patient had a PCN reaction causing immediate rash, facial/tongue/throat swelling, SOB or lightheadedness with hypotension: Yes Has patient had a PCN reaction causing severe rash involving mucus membranes or skin necrosis: Yes Has patient had a PCN reaction that required hospitalization No Has patient had a PCN reaction occurring within the last 10 years: No If all of the above answers are "NO", then may proceed with Cephalosporin use.   [name] Kitchen Ultram  [Tramadol] Hives  . Adhesive [Tape]     blisters  . Cefaclor Rash    Ceclor  . Erythromycin     Gastritis, abd cramps  . Trimethoprim Rash  . Pectin Rash    Pectin ring for stoma  . Sulfa Antibiotics Rash    No prescriptions prior to admission    No results found for this or any previous visit (from the past 48 hour(s)). No results found.  Review of Systems  Constitutional: Negative.  Negative for fever and chills.  HENT: Negative.   Eyes: Negative.   Respiratory: Negative.   Cardiovascular: Negative.   Gastrointestinal: Negative.   Genitourinary: Positive for urgency.       Mild stent colic symptoms  Musculoskeletal: Negative.   Skin: Negative.   Neurological: Negative.   Endo/Heme/Allergies: Negative.   Psychiatric/Behavioral: Negative.     There were no vitals taken  for this visit. Physical Exam  Constitutional: She appears well-developed.  HENT:  Head: Normocephalic.  Eyes: Pupils are equal, round, and reactive to light.  Neck: Normal range of motion.  Cardiovascular: Normal rate.   Respiratory: Effort normal.  GI: Soft.  Genitourinary:  NO CVAT  Musculoskeletal: Normal range of motion.  Neurological: She is alert.  Skin: Skin is warm.  Psychiatric: She has a normal mood and affect. Her behavior is normal. Judgment and thought content normal.     Assessment/Plan  1 - Bladder Injury - most recent imaging and cysto suggests durable repair.   2 - Metastatic Endometrial Cancer - per GYN oncology, NED.   3 - Right Ureteral Stricture - suggest chronic stent with goal of change Q4-44mos to maximize GFR / prevent ipsilateral renal decline. She has failed endoscopic dilation.  Given her recent intra-abdominal surgery and radiation to pelvis she is NOT candidate for any heroic reconstruction at this time as likelihood of success would be quite low.   Rec elective right stent exchange today as planned. If remains w/o significant encrustation, then will try to  increase interval to Q27mos thereafter. She is familiar with peri-op course. Risks, benefits, alternatives reiterated today. [name] Kitchen    Alexis Frock, MD 11/21/2015, 6:49 AM

## 2015-11-21 NOTE — Brief Op Note (Signed)
11/21/2015  12:50 PM  PATIENT:  [name]  60 y.o. female  PRE-OPERATIVE DIAGNOSIS:  RIGHT URETERAL STRICTURE  POST-OPERATIVE DIAGNOSIS:  right ureteral stricture  PROCEDURE:  Procedure(s): CYSTOSCOPY WITH RETROGRADE PYELOGRAM (Right) CYSTOSCOPY WITH STENT REPLACEMENT (Right)  SURGEON:  Surgeon(s) and Role:    * Alexis Frock, MD - Primary  PHYSICIAN ASSISTANT:   ASSISTANTS: none   ANESTHESIA:   general  EBL:  Total I/O In: 400 [I.V.:400] Out: 5 [Blood:5]  BLOOD ADMINISTERED:none  DRAINS: none   LOCAL MEDICATIONS USED:  NONE  SPECIMEN:  No Specimen  DISPOSITION OF SPECIMEN:  N/A  COUNTS:  YES  TOURNIQUET:  * No tourniquets in log *  DICTATION: .Other Dictation: Dictation Number [id number]  PLAN OF CARE: Discharge to home after PACU  PATIENT DISPOSITION:  PACU - hemodynamically stable.   Delay start of Pharmacological VTE agent (>24hrs) due to surgical blood loss or risk of bleeding: not applicable

## 2015-11-21 NOTE — Anesthesia Procedure Notes (Signed)
Procedure Name: LMA Insertion Date/Time: 11/21/2015 12:32 PM Performed by: Dione Booze Pre-anesthesia Checklist: Emergency Drugs available, Patient identified, Suction available and Patient being monitored Patient Re-evaluated:Patient Re-evaluated prior to inductionOxygen Delivery Method: Circle system utilized Preoxygenation: Pre-oxygenation with 100% oxygen Intubation Type: IV induction Ventilation: Mask ventilation without difficulty LMA: LMA inserted LMA Size: 4.0 Number of attempts: 1 Tube secured with: Tape Dental Injury: Teeth and Oropharynx as per pre-operative assessment

## 2015-11-21 NOTE — Transfer of Care (Signed)
Immediate Anesthesia Transfer of Care Note  Patient: [name]  Procedure(s) Performed: Procedure(s): CYSTOSCOPY WITH RETROGRADE PYELOGRAM (Right) CYSTOSCOPY WITH STENT REPLACEMENT (Right)  Patient Location: PACU  Anesthesia Type:General  Level of Consciousness: awake, alert , oriented and patient cooperative  Airway & Oxygen Therapy: Patient Spontanous Breathing and Patient connected to face mask oxygen  Post-op Assessment: Report given to RN and Post -op Vital signs reviewed and stable  Post vital signs: Reviewed and stable  Last Vitals:  Filed Vitals:   11/21/15 0915  BP: 118/58  Pulse: 93  Temp: 36.7 C  Resp: 18    Complications: No apparent anesthesia complications

## 2015-11-21 NOTE — Anesthesia Preprocedure Evaluation (Signed)
Anesthesia Evaluation  Patient identified by MRN, date of birth, ID band Patient awake    Reviewed: Allergy & Precautions, NPO status , Patient's Chart, lab work & pertinent test results  Airway Mallampati: II  TM Distance: <3 FB Neck ROM: Full    Dental no notable dental hx.    Pulmonary neg pulmonary ROS,    Pulmonary exam normal breath sounds clear to auscultation       Cardiovascular hypertension, Normal cardiovascular exam Rhythm:Regular Rate:Normal     Neuro/Psych negative neurological ROS  negative psych ROS   GI/Hepatic Neg liver ROS, GERD  Medicated,  Endo/Other  diabetesHypothyroidism Morbid obesity  Renal/GU negative Renal ROS  negative genitourinary   Musculoskeletal negative musculoskeletal ROS (+)   Abdominal (+) + obese,   Peds negative pediatric ROS (+)  Hematology negative hematology ROS (+)   Anesthesia Other Findings   Reproductive/Obstetrics negative OB ROS                             Anesthesia Physical Anesthesia Plan  ASA: III  Anesthesia Plan: General   Post-op Pain Management:    Induction: Intravenous  Airway Management Planned: LMA  Additional Equipment:   Intra-op Plan:   Post-operative Plan: Extubation in OR  Informed Consent: I have reviewed the patients History and Physical, chart, labs and discussed the procedure including the risks, benefits and alternatives for the proposed anesthesia with the patient or authorized representative who has indicated his/her understanding and acceptance.   Dental advisory given  Plan Discussed with: CRNA and Surgeon  Anesthesia Plan Comments:         Anesthesia Quick Evaluation

## 2015-11-21 NOTE — Op Note (Signed)
[name], [name] NO.:  [id number]  MEDICAL RECORD NO.:  [id number]  LOCATION:  [facility]                         FACILITY:  [facility]  PHYSICIAN:  Alexis Frock, MD     DATE OF BIRTH:  May 31, 1956  DATE OF PROCEDURE: 11/21/2015                              OPERATIVE REPORT  DIAGNOSIS:  Chronic right ureteral stricture.  PROCEDURES: 1. Cystoscopy with right retrograde pyelogram and interpretation. 2. Right ureteral stent exchange, 6 x 24, Polaris, no tether.  ESTIMATED BLOOD LOSS:  Nil.  COMPLICATION:  None.  SPECIMEN:  None.  FINDINGS: 1. Moderately encrusted distal end of stent. 2. Mild hydronephrosis, distal narrowing consistent with known distal     stricture. 3. Successful exchange of right ureteral stent, proximal in the upper     pole calyx, distal in the urinary bladder.  INDICATION:  [name] is a very pleasant, but unfortunate 60 year old lady with history of multiple gynecologic cancers including cervical cancer and endometrial cancer, status post urgent radiation and now unfortunately newly-diagnosed high-grade breast cancer, for which, she has undergone bilateral mastectomy next month.  She has a history of chronic right ureteral stricture that has been managed thus far by chronic stenting with stent exchange every 3-6 months.  She has failed a trial of endoscopic dilation.  She is due for planned stent change in approximately 1 month; however, this is around the time of her planned bilateral mastectomy and she has wisely decided to have her stent exchanged before this as not to interfere with her operative course for that.  Informed consent was obtained and placed in the medical record.  PROCEDURE IN DETAIL:  The patient being [facility], was verified. Procedure being right ureteral stent exchange was confirmed.  Procedure was carried out.  Time-out was performed.  Intravenous antibiotics were administered.  General LMA anesthesia was  introduced.  The patient was placed into a low lithotomy position and sterile field was created by prepping and draping the patient's vagina, introitus, and proximal thighs using iodine x3.  Next, cystourethroscopy was performed using a 23-French rigid cystoscope with 12-degree offset lens.  Inspection of the urinary bladder revealed the distal end of right ureteral stent in situ.  This was moderately encrusted at the level of its bladder component, not encrusted at all proximal to this.  The bladder component was grasped, brought to the level of the urethral meatus and a 0.038 Zip wire was advanced to the level of the upper pole and exchanged for 6- French end-hole catheter and right retrograde pyelogram was obtained.  Right retrograde pyelogram demonstrated a single right ureter with single-system right kidney.  There was moderate hydronephrosis and ureteronephrosis down to the distal ureter consistent with known stricture.  A 0.038 Zip wire was then advanced to the level of the upper pole, and a new 6 x 24 Polaris-type stent was then placed, proximal deployment, upper pole calyx, distal in the urinary bladder.  The bladder was emptied per cystoscope.  Procedure was then terminated.  The patient tolerated the procedure well.  There were no immediate periprocedural complications.  The patient was taken to the postanesthesia care unit in stable condition.  ______________________________ Alexis Frock, MD     TM/MEDQ  D:  11/21/2015  T:  11/21/2015  Job:  [id number]

## 2015-11-21 NOTE — Discharge Instructions (Signed)
1 - You may have urinary urgency (bladder spasms) and bloody urine on / off with stent in place. This is normal.  2 - Call MD or go to ER for fever >102, severe pain / nausea / vomiting not relieved by medications, or acute change in medical status   General Anesthesia, Adult, Care After Refer to this sheet in the next few weeks. These instructions provide you with information on caring for yourself after your procedure. Your health care provider may also give you more specific instructions. Your treatment has been planned according to current medical practices, but problems sometimes occur. Call your health care provider if you have any problems or questions after your procedure. WHAT TO EXPECT AFTER THE PROCEDURE After the procedure, it is typical to experience:  Sleepiness.  Nausea and vomiting. HOME CARE INSTRUCTIONS  For the first 24 hours after general anesthesia:  Have a responsible person with you.  Do not drive a car. If you are alone, do not take public transportation.  Do not drink alcohol.  Do not take medicine that has not been prescribed by your health care provider.  Do not sign important papers or make important decisions.  You may resume a normal diet and activities as directed by your health care provider.  Change bandages (dressings) as directed.  If you have questions or problems that seem related to general anesthesia, call the hospital and ask for the anesthetist or anesthesiologist on call. SEEK MEDICAL CARE IF:  You have nausea and vomiting that continue the day after anesthesia.  You develop a rash. SEEK IMMEDIATE MEDICAL CARE IF:   You have difficulty breathing.  You have chest pain.  You have any allergic problems.   This information is not intended to replace advice given to you by your health care provider. Make sure you discuss any questions you have with your health care provider.   Document Released: 10/25/2000 Document Revised:  08/09/2014 Document Reviewed: 11/17/2011 Elsevier Interactive Patient Education [employer].

## 2015-11-23 IMAGING — CT CT ABD-PELV W/ CM
2 of 5 series · 16 of 46 positions shown, 18 images · IV contrast (OMNIPAQUE)
Comparison: MRI 10/14/2014

CLINICAL DATA: Recurrent endometrial carcinoma. Stomal infection in
setting of chronic neutropenia.

EXAM:
CT ABDOMEN AND PELVIS WITH CONTRAST
TECHNIQUE: Multidetector CT imaging of the abdomen and pelvis was performed
using the standard protocol following bolus administration of
intravenous contrast.
CONTRAST:  50mL OMNIPAQUE IOHEXOL 300 MG/ML SOLN, 100mL OMNIPAQUE
IOHEXOL 300 MG/ML SOLN

[Series 2: rtn a/p with · axial · 0.78mm/px · z∈[-441,-51]mm · 13 of 88 slices shown, 15 images]
[im 5/88  soft-tissue]
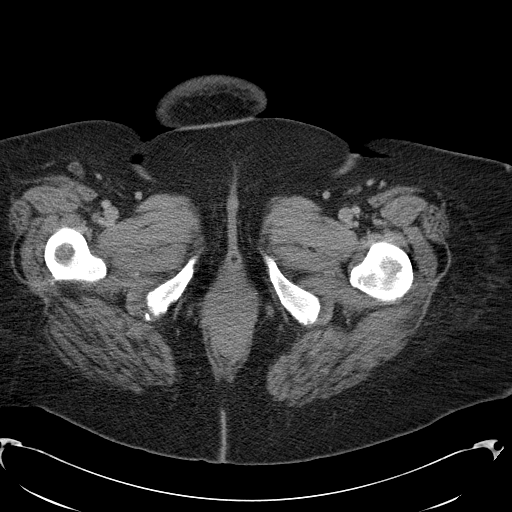
[im 5/88  bone]
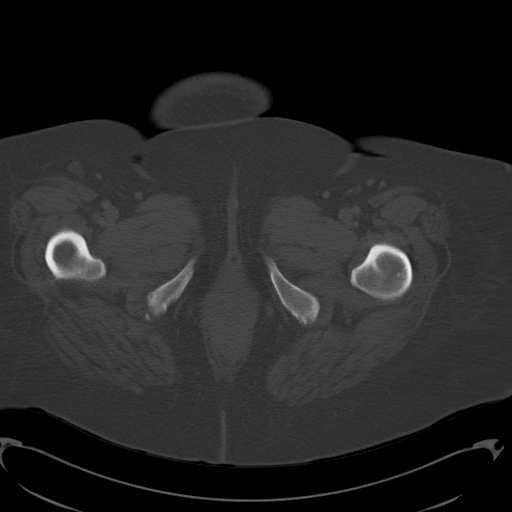
[im 14/88  soft-tissue]
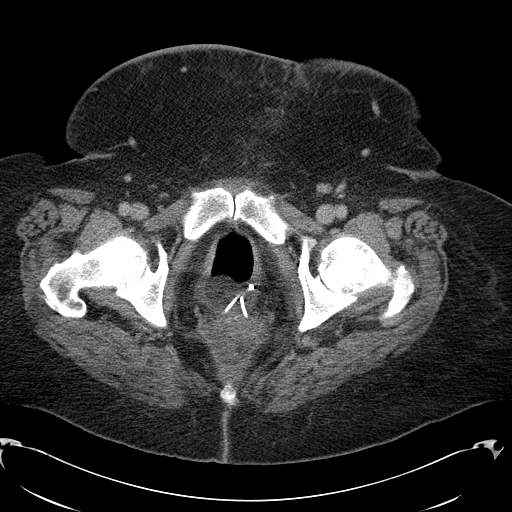
[im 19/88  soft-tissue]
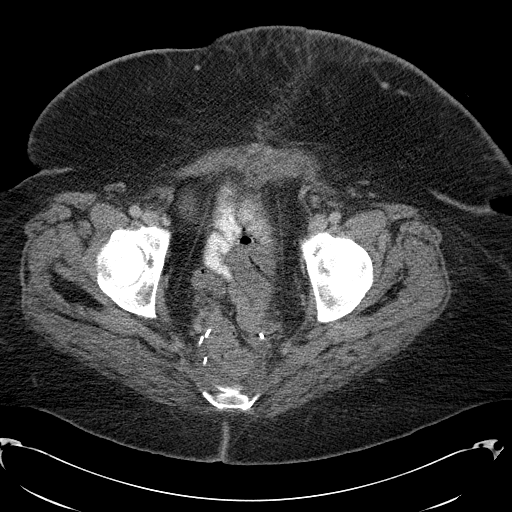
[im 23/88  soft-tissue]
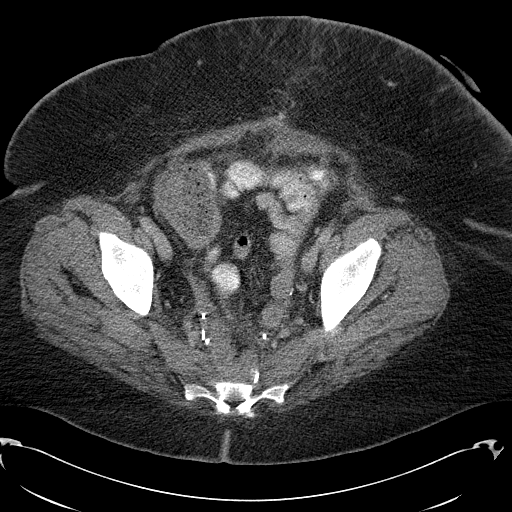
[im 33/88  soft-tissue]
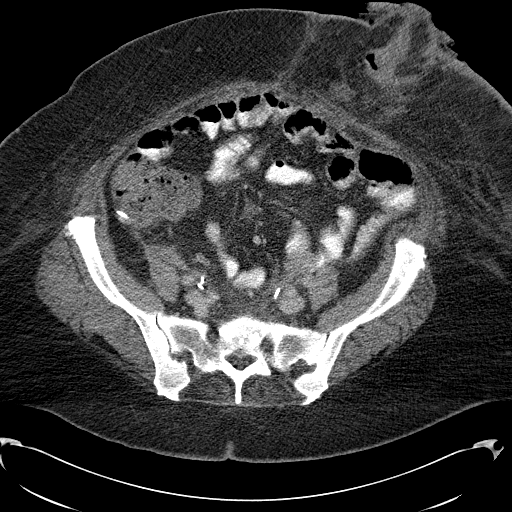
[im 37/88  soft-tissue]
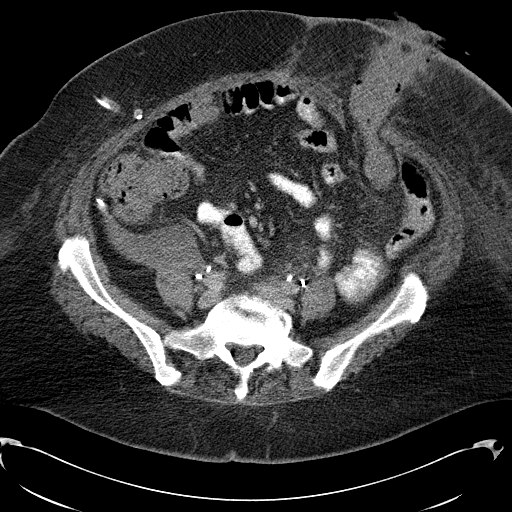
[im 46/88  soft-tissue]
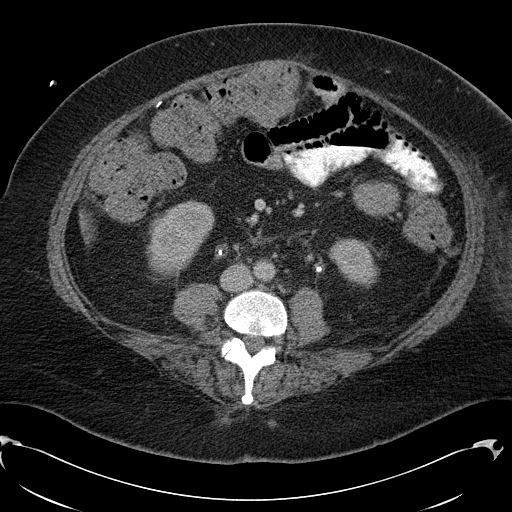
[im 51/88  soft-tissue]
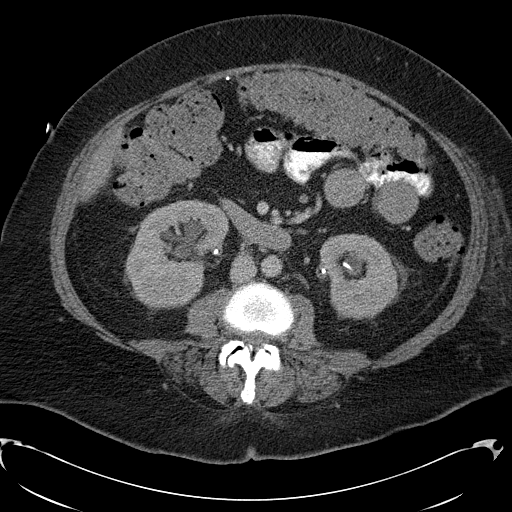
[im 55/88  soft-tissue]
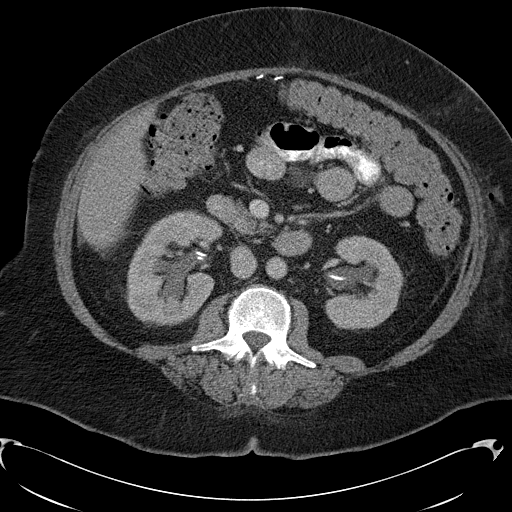
[im 55/88  bone]
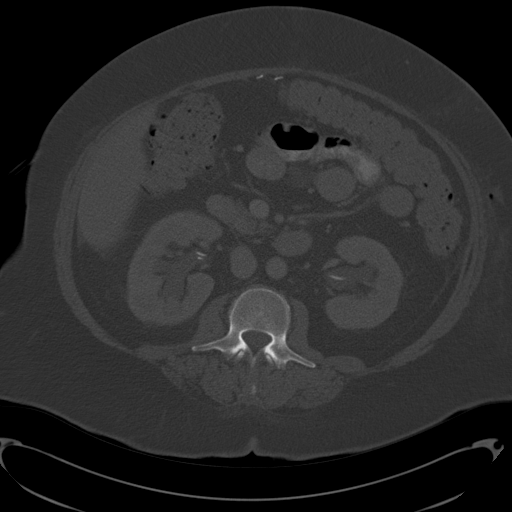
[im 65/88  soft-tissue]
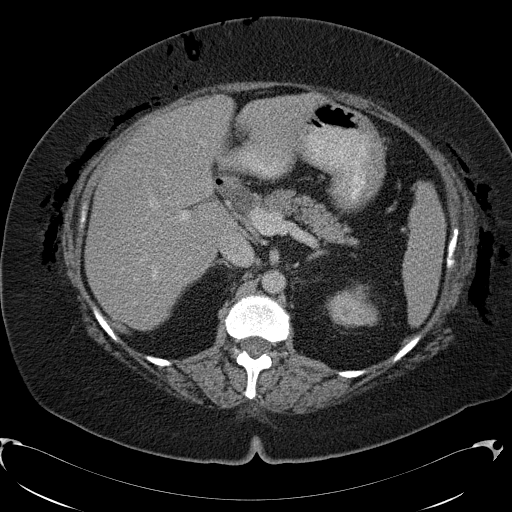
[im 69/88  soft-tissue]
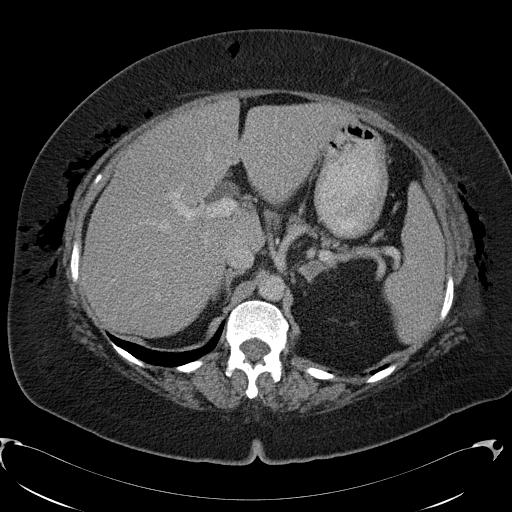
[im 74/88  soft-tissue]
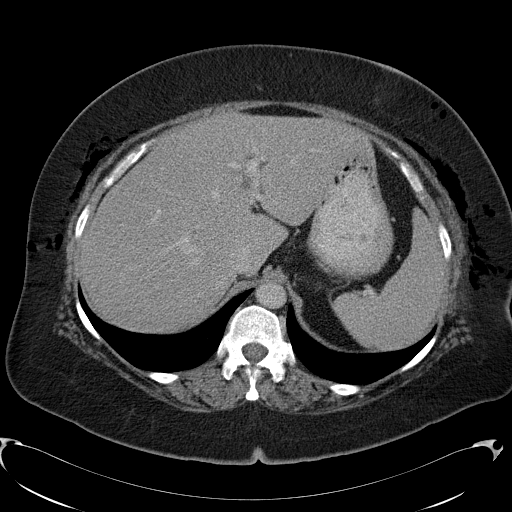
[im 83/88  soft-tissue]
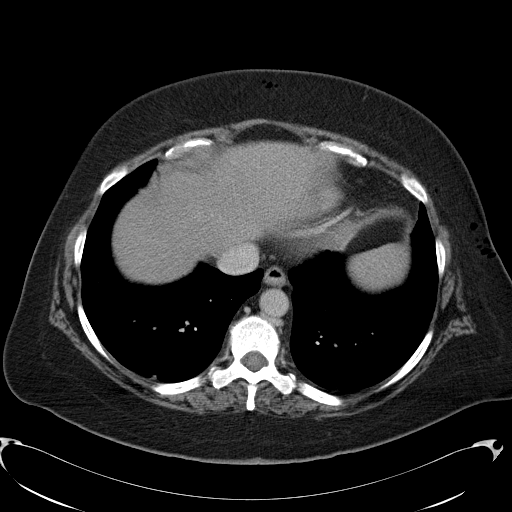

[Series 602: <mpr thick range> · coronal · 0.85mm/px · 3 of 185 slices shown]
[im 62/185  soft-tissue]
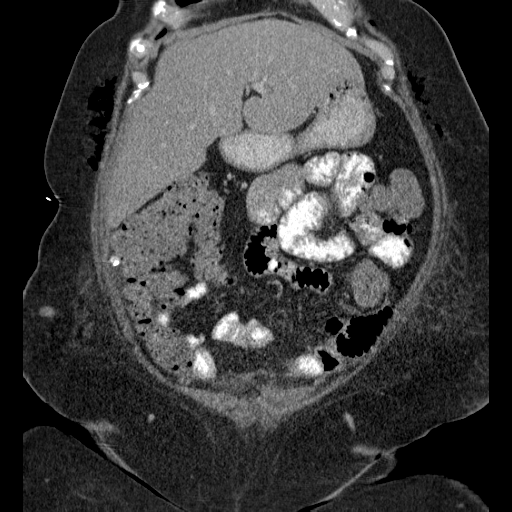
[im 82/185  soft-tissue]
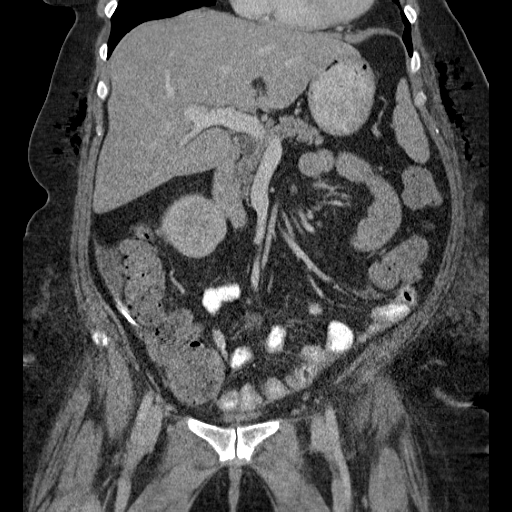
[im 103/185  soft-tissue]
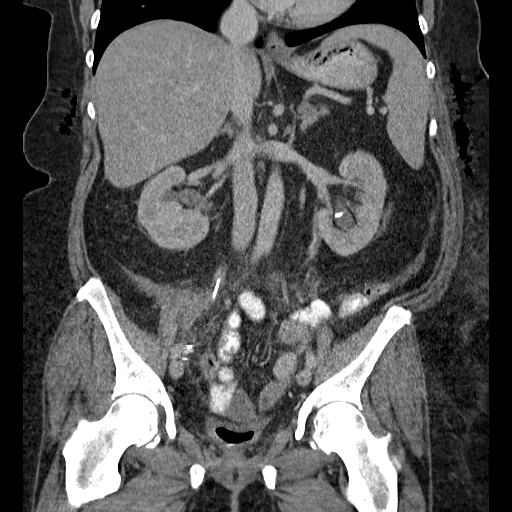

[16 of 46 positions shown; findings below may reference images not displayed]

FINDINGS: In the upper abdomen, there is soft tissue gas at the superficial
aspect of the abdominal wall musculature in the right upper
quadrant, left upper quadrant and right para midline epigastric
region. No significant inflammatory changes or fluid are evident in
these areas.

In the abdominal left lower quadrant there is a mid descending
colostomy. There is inflammatory stranding in the subcutaneous soft
tissues around the ostomy. There is soft tissue gas at the lateral
aspect of the stoma, but no evidence of a drainable collection.

There is a small volume peritoneal fluid in the right pericolic
gutter, but no evidence of a contained fluid collection or abscess
in the abdomen or pelvis. There is is a peritoneal drain which does
not pass through this portion of the right pericolic gutter. There
are bilateral ureteral stents. There is mild bilateral
hydronephrosis.

There are normal appearances of the liver, with no evidence of
hepatic metastasis. There are normal appearances of the spleen,
pancreas, and adrenals. Stomach and small bowel appear unremarkable.
The oral contrast has passed through to the distal ileum with no
evidence of bowel obstruction.

There is no abdominal adenopathy. There is postoperative stranding
in the pelvis, particularly in the presacral region.

There is no significant abnormality in the lower chest.
IMPRESSION: *Inflammatory stranding and soft tissue gas around the left lower
quadrant ostomy, particularly at its lateral aspect. No drainable
collection. This likely represents cellulitis.
*In the upper abdomen, there is soft tissue gas at the superficial
aspect of the abdominal wall musculature without significant
inflammatory change or fluid. This might relate to the recent
surgery but cannot be characterized.
*Small volume peritoneal fluid in the right pericolic gutter. The
peritoneal JP drain does not pass through this portion of the
gutter. No contained collection or abscess is evident.
*Mild bilateral hydronephrosis. Satisfactorily positioned ureteral
stents bilaterally.

## 2015-11-24 LAB — GLUCOSE, CAPILLARY: GLUCOSE-CAPILLARY: 121 mg/dL — AB (ref 65–99)

## 2015-11-26 ENCOUNTER — Telehealth: Payer: Self-pay | Admitting: *Deleted

## 2015-11-26 ENCOUNTER — Telehealth: Payer: Self-pay | Admitting: Genetic Counselor

## 2015-11-26 ENCOUNTER — Other Ambulatory Visit (HOSPITAL_BASED_OUTPATIENT_CLINIC_OR_DEPARTMENT_OTHER): Payer: BLUE CROSS/BLUE SHIELD

## 2015-11-26 DIAGNOSIS — C50212 Malignant neoplasm of upper-inner quadrant of left female breast: Secondary | ICD-10-CM

## 2015-11-26 DIAGNOSIS — Z8542 Personal history of malignant neoplasm of other parts of uterus: Secondary | ICD-10-CM | POA: Diagnosis not present

## 2015-11-26 DIAGNOSIS — Z8543 Personal history of malignant neoplasm of ovary: Secondary | ICD-10-CM

## 2015-11-26 LAB — COMPREHENSIVE METABOLIC PANEL
ALT: 18 U/L (ref 0–55)
ANION GAP: 10 meq/L (ref 3–11)
AST: 13 U/L (ref 5–34)
Albumin: 2.8 g/dL — ABNORMAL LOW (ref 3.5–5.0)
Alkaline Phosphatase: 86 U/L (ref 40–150)
BUN: 14.4 mg/dL (ref 7.0–26.0)
CALCIUM: 9.4 mg/dL (ref 8.4–10.4)
CO2: 28 mEq/L (ref 22–29)
CREATININE: 1.1 mg/dL (ref 0.6–1.1)
Chloride: 105 mEq/L (ref 98–109)
EGFR: 57 mL/min/{1.73_m2} — AB (ref >90)
Glucose: 117 mg/dl (ref 70–140)
Potassium: 3.7 mEq/L (ref 3.5–5.1)
Sodium: 142 mEq/L (ref 136–145)
TOTAL PROTEIN: 7.3 g/dL (ref 6.4–8.3)

## 2015-11-26 LAB — CBC WITH DIFFERENTIAL/PLATELET
BASO%: 0.3 % (ref 0.0–2.0)
Basophils Absolute: 0 10*3/uL (ref 0.0–0.1)
EOS ABS: 0.1 10*3/uL (ref 0.0–0.5)
EOS%: 3.9 % (ref 0.0–7.0)
HCT: 35.5 % (ref 34.8–46.6)
HEMOGLOBIN: 11.1 g/dL — AB (ref 11.6–15.9)
LYMPH%: 19 % (ref 14.0–49.7)
MCH: 27.1 pg (ref 25.1–34.0)
MCHC: 31.3 g/dL — ABNORMAL LOW (ref 31.5–36.0)
MCV: 86.8 fL (ref 79.5–101.0)
MONO#: 0.3 10*3/uL (ref 0.1–0.9)
MONO%: 9.8 % (ref 0.0–14.0)
NEUT#: 2 10*3/uL (ref 1.5–6.5)
NEUT%: 67 % (ref 38.4–76.8)
PLATELETS: 303 10*3/uL (ref 145–400)
RBC: 4.09 10*6/uL (ref 3.70–5.45)
RDW: 15.3 % — AB (ref 11.2–14.5)
WBC: 3.1 10*3/uL — AB (ref 3.9–10.3)
lymph#: 0.6 10*3/uL — ABNORMAL LOW (ref 0.9–3.3)

## 2015-11-26 NOTE — Telephone Encounter (Signed)
-----   Message from Heath Lark, MD sent at 11/26/2015  1:30 PM EDT ----- Regarding: CBC CBC looks good Tell her to try space out neupogen to 6 days and put POF for labs recheck next Tues ----- Message -----    From: Curt Bears, MD    Sent: 11/26/2015   1:03 PM      To: Heath Lark, MD    ----- Message -----    From: [facility] Interface    Sent: 11/26/2015  10:26 AM      To: Curt Bears, MD

## 2015-11-26 NOTE — Telephone Encounter (Signed)
LM on VM that results were back and to please CB. 

## 2015-11-26 NOTE — Telephone Encounter (Signed)
Pt notified of message below. Will come in for labs on Monday.

## 2015-11-27 ENCOUNTER — Telehealth: Payer: Self-pay | Admitting: Genetic Counselor

## 2015-11-27 ENCOUNTER — Ambulatory Visit: Payer: Self-pay | Admitting: Genetic Counselor

## 2015-11-27 DIAGNOSIS — C549 Malignant neoplasm of corpus uteri, unspecified: Secondary | ICD-10-CM

## 2015-11-27 DIAGNOSIS — C50212 Malignant neoplasm of upper-inner quadrant of left female breast: Secondary | ICD-10-CM

## 2015-11-27 DIAGNOSIS — Z1379 Encounter for other screening for genetic and chromosomal anomalies: Secondary | ICD-10-CM | POA: Insufficient documentation

## 2015-11-27 NOTE — Progress Notes (Addendum)
HPI:  [name] was previously seen in the [facility] due to a personal and family history of cancer and concerns regarding a hereditary predisposition to cancer. Please refer to our prior cancer genetics clinic note for more information regarding [name]'s medical, social and family histories, and our assessment and recommendations, at the time. [name] recent genetic test results were disclosed to her, as were recommendations warranted by these results. These results and recommendations are discussed in more detail below.  FAMILY HISTORY:  We obtained a detailed, 4-generation family history.  Significant diagnoses are listed below: Family History  Problem Relation Age of Onset   Cancer Mother 59    stomach ca   Hypertension Mother    Cancer Father 2    prostate ca   Diabetes Father    Heart disease Father     CABG   Diabetes Sister    Hypertension Brother y-10   Heart disease Brother     CABG   Diabetes Brother    Breast cancer Maternal Aunt     dx in her 74s   Lymphoma Paternal Aunt    Brain cancer Paternal Grandfather    Ovarian cancer Other     The patient has one daughter and a maternal half brother who are cancer free.  The patient's mother was diagnosed with stomach cancer at age 47.  Her mother had two sisters, one who had breast cancer in her 51s.  The patient's maternal grandmother's mother died of ovarian cancer.  The patient's father was diagnosed with prostate cancer at age 64.  He had one sister who had lymphoma, and his father died of brain cancer at an unknown age.  There is no other reported history of cancer.  Patient's maternal ancestors are of English descent, and paternal ancestors are of [place] descent. There is no reported Ashkenazi Jewish ancestry. There is no known consanguinity.  GENETIC TEST RESULTS: At the time of [name]'s visit, we recommended she pursue genetic testing of the Hereditary Common Cancer gene panel.  The Hereditary Gene Panel offered by [facility] includes sequencing and/or deletion duplication testing of the following 42 genes: APC, ATM, AXIN2, BARD1, BMPR1A, BRCA1, BRCA2, BRIP1, CDH1, CDKN2A, CHEK2, DICER1, EPCAM, GREM1, KIT, MEN1, MLH1, MSH2, [name], MUTYH, NBN, NF1, PALB2, PDGFRA, PMS2, POLD1, POLE, PTEN, RAD50, RAD51C, RAD51D, SDHA, SDHB, SDHC, SDHD, SMAD4, SMARCA4. STK11, TP53, TSC1, TSC2, and VHL.  The report date is November 18, 2015.  Genetic testing was normal, and did not reveal a deleterious mutation in these genes. The test report has been scanned into EPIC and is located under the Molecular Pathology section of the Results Review tab.  [name] was negative for the BRCA1 variant found in her cousin.  We discussed with [name] that since the current genetic testing is not perfect, it is possible there may be a gene mutation in one of these genes that current testing cannot detect, but that chance is small.  We also discussed, that it is possible that another gene that has not yet been discovered, or that we have not yet tested, is responsible for the cancer diagnoses in the family, and it is, therefore, important to remain in touch with cancer genetics in the future so that we can continue to offer [name] the most up to date genetic testing.   Genetic testing did detect a Variant of Unknown Significance in the [name] gene called c.[id number]. At this time, it is unknown if this  variant is associated with increased cancer risk or if this is a normal finding, but most variants such as this get reclassified to being inconsequential. It should not be used to make medical management decisions. With time, we suspect the lab will determine the significance of this variant, if any. If we do learn more about it, we will try to contact [name] to discuss it further. However, it is important to stay in touch with [place] periodically and keep the address and phone number up to date. 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  The amended report date is February 25, 2021.  CANCER SCREENING RECOMMENDATIONS: This result is reassuring and indicates that [name] likely does not have an increased risk for a future cancer due to a mutation in one of these genes. This normal test also suggests that [name]'s cancer was most likely not due to an inherited predisposition associated with one of these genes.  Most cancers happen by chance and this negative test suggests that her cancer falls into this category.  We, therefore, recommended she continue to follow the cancer management and screening guidelines provided by her oncology and primary healthcare provider.   RECOMMENDATIONS FOR FAMILY MEMBERS:  Women in this family might be at some increased risk of developing cancer, over the general population risk, simply due to the family history of cancer.  We recommended women in this family have a yearly mammogram beginning at age 47, or 70 years younger than the earliest onset of cancer, an an annual clinical breast exam, and perform monthly breast self-exams. Women in this family should also have a gynecological exam as recommended by their primary provider. All family members should have a colonoscopy by age 59.  FOLLOW-UP: Lastly, we discussed with [name] that cancer genetics is a rapidly advancing field and it is possible that new genetic tests will be appropriate for her and/or her family members in the future. We encouraged her to remain in contact with cancer genetics on an annual basis so we can update her personal and family histories and let her know of advances in cancer genetics that may benefit this family.   Our contact number was provided. [name]'s questions were answered to her satisfaction, and she knows she is welcome to call us at anytime with additional questions or concerns.   Roma Kayser, MS, [facility] Certified Genetic  Counselor [email or web address]

## 2015-11-27 NOTE — Telephone Encounter (Signed)
Revealed negative genetic test results on the [facility] hereditary cancer panel.  Discussed that she did not have the BRCA1 variant found in her cousin, but that a VUS in MSH6 was found.  Discussed that her medical management would not be changed based on this VUS and that we would not offer her daughter testing for a VUS or change her daughters management.  Patient voiced understanding.

## 2015-11-28 IMAGING — CR DG ABD PORTABLE 2V
1 series · 4 of 4 positions shown · non-contrast
Comparison: 12/02/2014.

CLINICAL DATA: Nausea and vomiting.  Abdominal pain today.

EXAM:
PORTABLE ABDOMEN - 2 VIEW

[Series 1: ap (kub) · U · 4 of 4 slices shown]
[im 1/4]
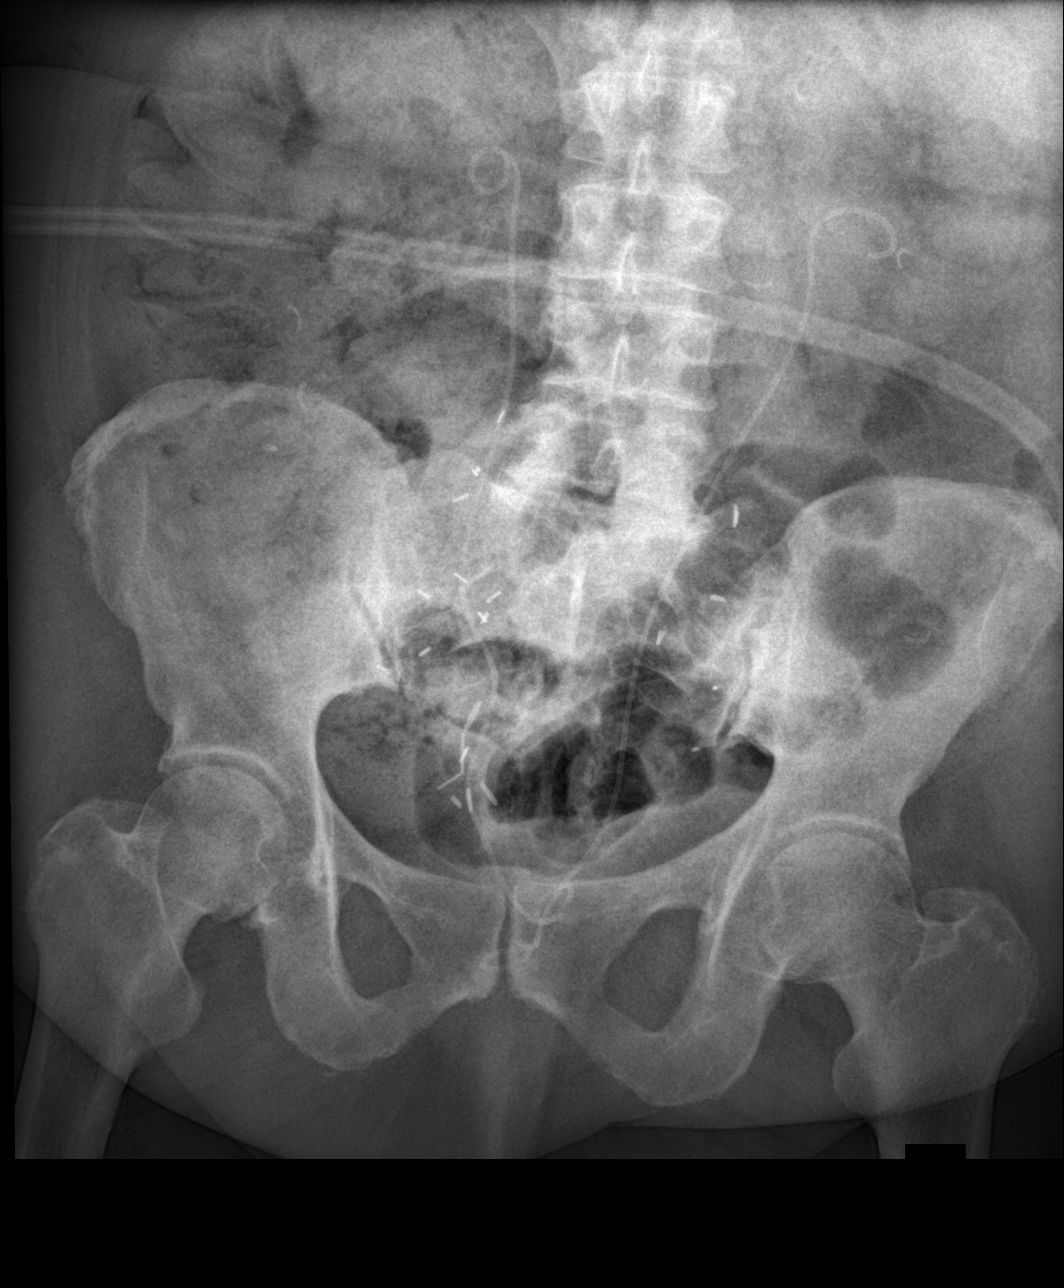
[im 2/4]
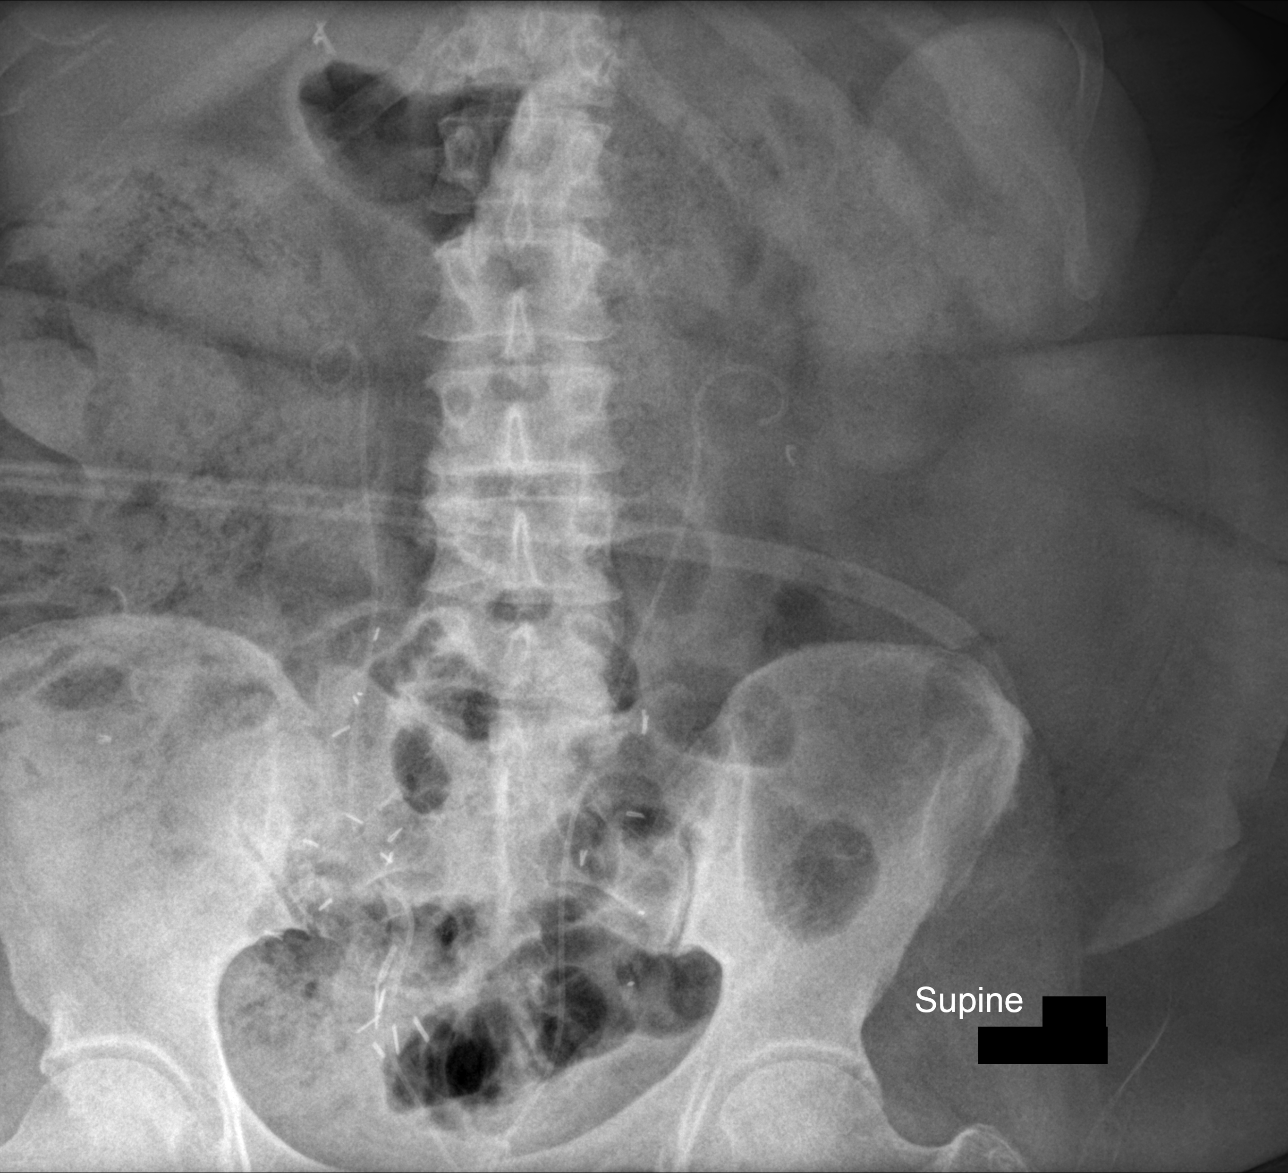
[im 3/4]
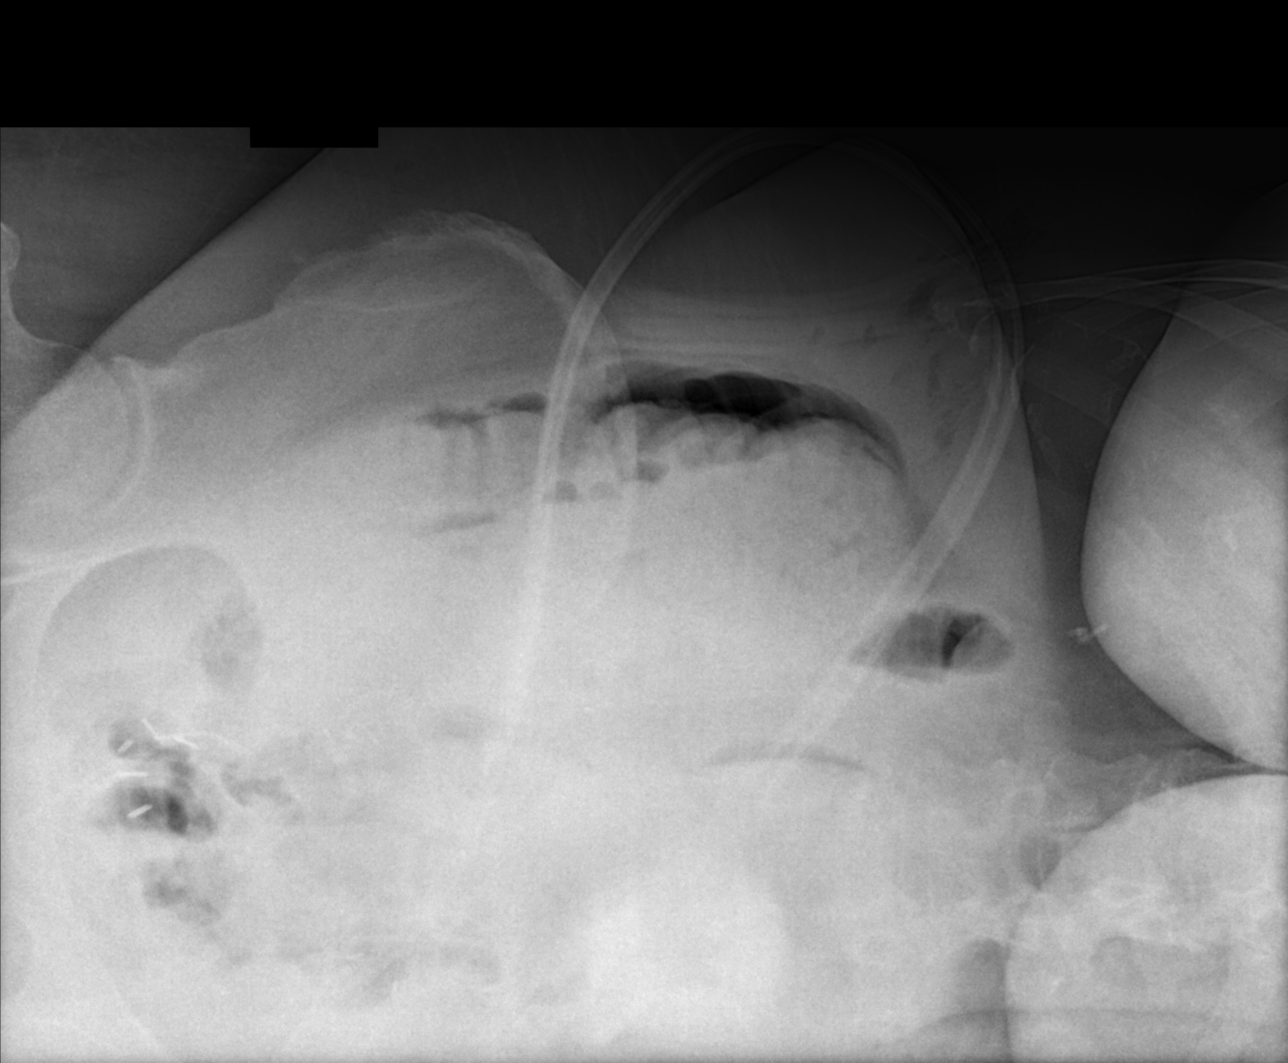
[im 4/4]
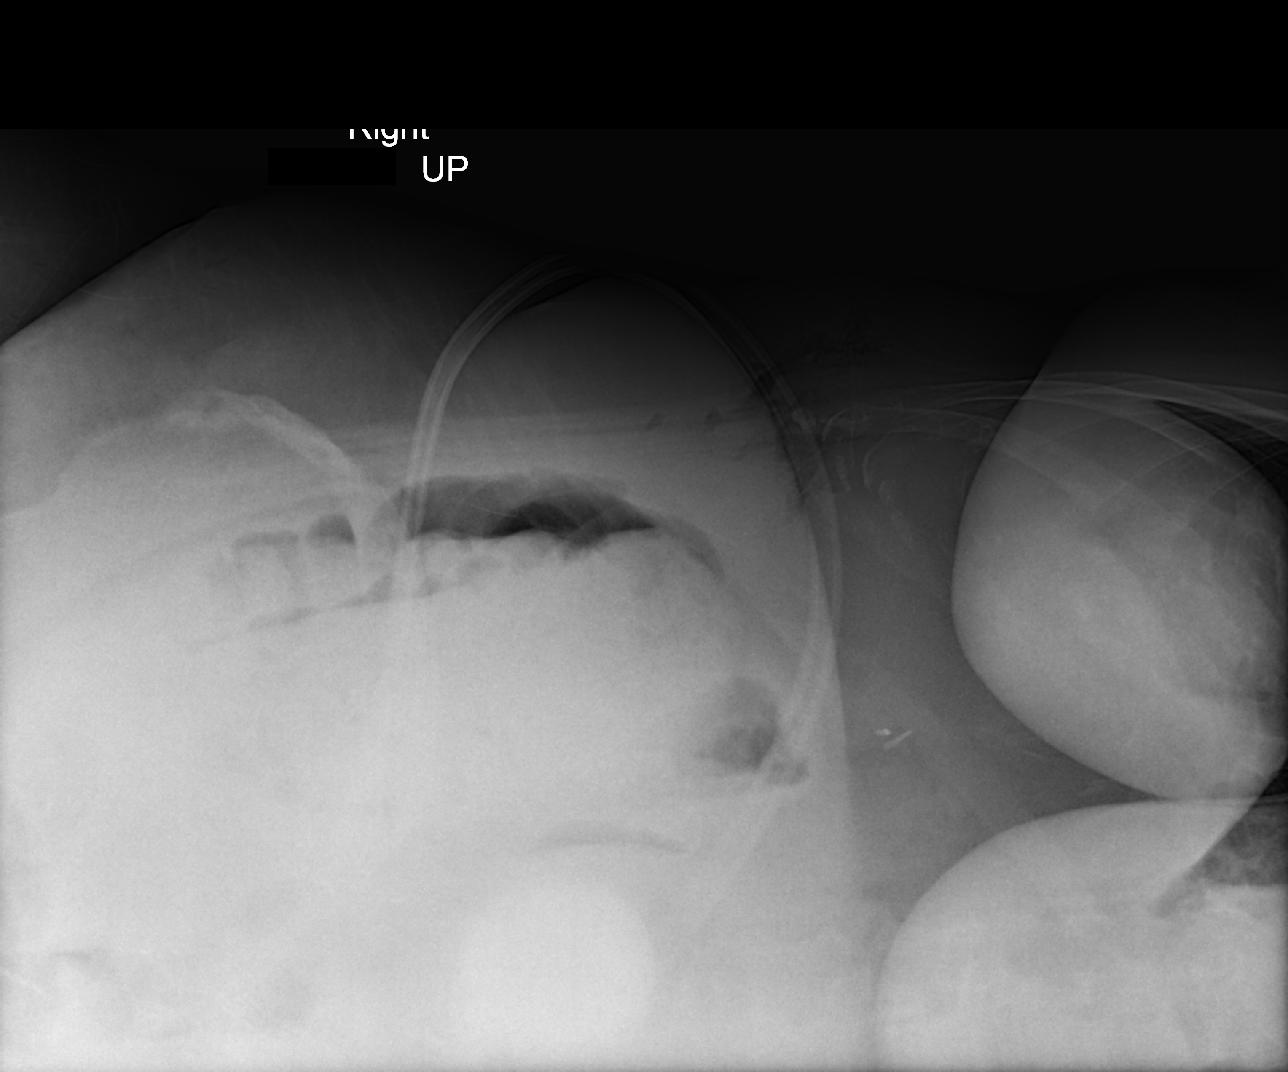

[4 of 4 positions shown; findings below may reference images not displayed]

FINDINGS: Bilateral double-J ureteral stents are present with distal loops
reconstituted in the urinary bladder. Partial reconstitution of the
LEFT renal pelvis loop. Surgical clips compatible with pelvic
lymphadenectomy. Tubing projects over the abdomen. There is no gross
plain film evidence of free air. Cholecystectomy clips are present
in the right upper quadrant. Large stool burden. Soft tissue
emphysema is present along the LEFT side of the abdomen, likely
residual gas from prior CT 12/02/2014.
IMPRESSION: 1. Residual soft tissue emphysema in the RIGHT abdominal wall.
2. No gross plain film evidence of free air.
3. Large stool burden.
4. Double-J ureteral stents and postsurgical changes in the abdomen
and pelvis.

## 2015-12-01 ENCOUNTER — Ambulatory Visit: Payer: BLUE CROSS/BLUE SHIELD | Attending: Gynecologic Oncology | Admitting: Gynecologic Oncology

## 2015-12-01 ENCOUNTER — Encounter: Payer: Self-pay | Admitting: Gynecologic Oncology

## 2015-12-01 ENCOUNTER — Other Ambulatory Visit (HOSPITAL_BASED_OUTPATIENT_CLINIC_OR_DEPARTMENT_OTHER): Payer: BLUE CROSS/BLUE SHIELD

## 2015-12-01 DIAGNOSIS — D709 Neutropenia, unspecified: Secondary | ICD-10-CM

## 2015-12-01 DIAGNOSIS — Z8542 Personal history of malignant neoplasm of other parts of uterus: Secondary | ICD-10-CM | POA: Insufficient documentation

## 2015-12-01 DIAGNOSIS — I10 Essential (primary) hypertension: Secondary | ICD-10-CM | POA: Diagnosis not present

## 2015-12-01 DIAGNOSIS — Z803 Family history of malignant neoplasm of breast: Secondary | ICD-10-CM | POA: Insufficient documentation

## 2015-12-01 DIAGNOSIS — Z7984 Long term (current) use of oral hypoglycemic drugs: Secondary | ICD-10-CM | POA: Insufficient documentation

## 2015-12-01 DIAGNOSIS — D6481 Anemia due to antineoplastic chemotherapy: Secondary | ICD-10-CM | POA: Diagnosis not present

## 2015-12-01 DIAGNOSIS — E119 Type 2 diabetes mellitus without complications: Secondary | ICD-10-CM | POA: Diagnosis not present

## 2015-12-01 DIAGNOSIS — Z808 Family history of malignant neoplasm of other organs or systems: Secondary | ICD-10-CM | POA: Diagnosis not present

## 2015-12-01 DIAGNOSIS — Z1509 Genetic susceptibility to other malignant neoplasm: Secondary | ICD-10-CM

## 2015-12-01 DIAGNOSIS — E042 Nontoxic multinodular goiter: Secondary | ICD-10-CM | POA: Insufficient documentation

## 2015-12-01 DIAGNOSIS — D701 Agranulocytosis secondary to cancer chemotherapy: Secondary | ICD-10-CM | POA: Diagnosis not present

## 2015-12-01 DIAGNOSIS — Z79899 Other long term (current) drug therapy: Secondary | ICD-10-CM | POA: Insufficient documentation

## 2015-12-01 DIAGNOSIS — E039 Hypothyroidism, unspecified: Secondary | ICD-10-CM | POA: Insufficient documentation

## 2015-12-01 DIAGNOSIS — C50912 Malignant neoplasm of unspecified site of left female breast: Secondary | ICD-10-CM | POA: Insufficient documentation

## 2015-12-01 DIAGNOSIS — K6289 Other specified diseases of anus and rectum: Secondary | ICD-10-CM | POA: Diagnosis not present

## 2015-12-01 DIAGNOSIS — K219 Gastro-esophageal reflux disease without esophagitis: Secondary | ICD-10-CM | POA: Diagnosis not present

## 2015-12-01 DIAGNOSIS — Z933 Colostomy status: Secondary | ICD-10-CM | POA: Insufficient documentation

## 2015-12-01 DIAGNOSIS — C541 Malignant neoplasm of endometrium: Secondary | ICD-10-CM

## 2015-12-01 DIAGNOSIS — Z8249 Family history of ischemic heart disease and other diseases of the circulatory system: Secondary | ICD-10-CM | POA: Insufficient documentation

## 2015-12-01 DIAGNOSIS — C549 Malignant neoplasm of corpus uteri, unspecified: Secondary | ICD-10-CM

## 2015-12-01 DIAGNOSIS — Z880 Allergy status to penicillin: Secondary | ICD-10-CM | POA: Insufficient documentation

## 2015-12-01 DIAGNOSIS — Z170 Estrogen receptor positive status [ER+]: Secondary | ICD-10-CM | POA: Diagnosis not present

## 2015-12-01 DIAGNOSIS — C50919 Malignant neoplasm of unspecified site of unspecified female breast: Secondary | ICD-10-CM

## 2015-12-01 DIAGNOSIS — C569 Malignant neoplasm of unspecified ovary: Secondary | ICD-10-CM | POA: Diagnosis not present

## 2015-12-01 DIAGNOSIS — Z833 Family history of diabetes mellitus: Secondary | ICD-10-CM | POA: Insufficient documentation

## 2015-12-01 DIAGNOSIS — Z807 Family history of other malignant neoplasms of lymphoid, hematopoietic and related tissues: Secondary | ICD-10-CM | POA: Diagnosis not present

## 2015-12-01 DIAGNOSIS — N134 Hydroureter: Secondary | ICD-10-CM | POA: Diagnosis not present

## 2015-12-01 DIAGNOSIS — F329 Major depressive disorder, single episode, unspecified: Secondary | ICD-10-CM | POA: Insufficient documentation

## 2015-12-01 DIAGNOSIS — Z923 Personal history of irradiation: Secondary | ICD-10-CM | POA: Insufficient documentation

## 2015-12-01 LAB — CBC & DIFF AND RETIC
BASO%: 0.4 % (ref 0.0–2.0)
Basophils Absolute: 0 10*3/uL (ref 0.0–0.1)
EOS%: 2.6 % (ref 0.0–7.0)
Eosinophils Absolute: 0.1 10*3/uL (ref 0.0–0.5)
HCT: 35.9 % (ref 34.8–46.6)
HGB: 11.3 g/dL — ABNORMAL LOW (ref 11.6–15.9)
Immature Retic Fract: 13.7 % — ABNORMAL HIGH (ref 1.60–10.00)
LYMPH#: 0.8 10*3/uL — AB (ref 0.9–3.3)
LYMPH%: 17.8 % (ref 14.0–49.7)
MCH: 27.6 pg (ref 25.1–34.0)
MCHC: 31.5 g/dL (ref 31.5–36.0)
MCV: 87.8 fL (ref 79.5–101.0)
MONO#: 0.4 10*3/uL (ref 0.1–0.9)
MONO%: 9.3 % (ref 0.0–14.0)
NEUT#: 3.2 10*3/uL (ref 1.5–6.5)
NEUT%: 69.9 % (ref 38.4–76.8)
PLATELETS: 260 10*3/uL (ref 145–400)
RBC: 4.09 10*6/uL (ref 3.70–5.45)
RDW: 15.9 % — ABNORMAL HIGH (ref 11.2–14.5)
RETIC CT ABS: 76.07 10*3/uL (ref 33.70–90.70)
Retic %: 1.86 % (ref 0.70–2.10)
WBC: 4.6 10*3/uL (ref 3.9–10.3)

## 2015-12-01 NOTE — Progress Notes (Signed)
Recurrent Endometrial cancer FOLLOWUP VISIT  Assessment:    60 y.o. year old with recurrent endometrioid endometrial/ovarian cancer.   S/p exploratory laparotomy, posterior supralevator pelvic exenteration, end colostomy, bladder repair, ureteral stenting with complete resection and negative margins on 11/09/14. 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 S/p hospital admission 12/20/14 for urosepsis  S/p adjuvant radiation completed 03/10/15  Postoperative and post-radiation right hydroureter and distal ureteral obstruction.  No convincing evidence for recurrence/persistence of tumor on imaging and physical exam (complete clinical response).   Now with left breast cancer.  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 No specific follow-up or surveillance recommended by genetics counselor.  Plan: 1) Recurrent endometrial/ovarian cancer: s/p complete resection with negative margins and s/p adjuvant radiation. No chemotherapy due to chronic idiopathic neutropenia.   CT imaging post treatment (January, 2017) shows stable thickening of upper vagina and right peri-urethral tissues. Given that there was no macroscopic disease at the completion of surgery and on pre-radiation imaging, I believe this is most likely radiation changes and postop changes. Recommend repeat CT in August 2017.  2) Right hydroureter and distal ureteral obstruction - secondary to postoperative scaring and post-radiation changes. Appreciate Dr Zettie Pho assistance with dilation and stenting. Last stent exchange April 2017.  3) neutropenia - Dr Alvy Bimler is managing this and we appreciate.  4) breast cancer - surgery planned with Dr Barry Dienes for May 11th, 2017. ER/PR positive  5) MSH6 gene mutation of unclear significance. Will have patietn follow-up with Dr Cristina Gong after breast surgery for close colon cancer and upper GI cancer surveillance  6) Follow-up: I will see [name] for  followup in August, 2017.  HPI:  [name] is a 60 y.o. 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  She has a history of endometrial and ovarian endometrioid carcinoma treated in 2006 by Dr. [name] at [facility] in [place]. Her surgery (TAH, BSO) was followed by adjuvant chemotherapy with carboplatin plus paclitaxel due to the ovarian involvement and the presence of a cul de sac lesion also positive for disease. She denies receiving adjuvant radiation therapy. She is unclear if she had metastatic endometrial cancer to the ovary or duel primaries. She had a complete response to therapy however developed leukopenia in July 2010. 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 She sees Dr. Alvy Bimler for this and is treated with G-CSF injections.  She began experiencing rectal pain approximately in January 2016. She also reports narrowing of caliber of the stool. She denies hematochezia. She does report approximately 3 months of vaginal spotting. She's had no specific follow-up for her gynecologic cancers in the past 4 years.  As part of workup of her rectal pain she underwent a CT scan of the abdomen and pelvis on 09/12/2014. This demonstrated a new right perirectal mass abutting the vaginal cuff measuring 3.8 x 4.9 cm. There is a limited fat plane between the mass and the rectum posteriorly. Rectal invasion could not be excluded. There were no other masses identified in the abdomen and pelvis or lymphadenopathy. There is no other evidence of metastatic disease or recurrent disease. There was no hydronephrosis. A CA-125 drawn on 09/12/2014 was normal at 10.  PET was negative for extrapelvic disease. MRI defined the lesion as a 3.5x5cm lesion to the right of  the rectum at the vaginal cuff.  Colonoscopy was performed on 10/02/14 with transrectal [place] and biopsy and this revealed endometrioid adenocarcinoma. Of note, the lesion was not seen within the lumen of the rectum.   She then underwent a posterior supralevator exenteration with colostomy and bladder repair and stent placement on 2/91/91 without complications.  Her postoperative course was uncomplicate with the exception of development of postop anemia.  Her final pathology revealed endometrioid adenocarcinoma invading the vagina and rectum with negative margins on the specimen. The margin had been close (clinically) to the right pelvic sidewall which was marked with surgical clips.  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 It was treated with IV antibiotics and then on POD 15 she was taken to the OR for laparoscopic revision of the stoma with Dr Michael [name]. Postoperatively she had wound vac and packing for her stomal wound.  A retrograde cystogram on week 4 postop confirmed an intact bladder and the foley was removed.  She initially had some voiding issues with decreased sensation to void. We tested a post void residual in May, 2016 and this revealed adequate voiding.  On 12/30/14 she was admitted to [facility] with sepsis associated with Enterobacter Cloacae UTI. This was treated with IV antibiotics and then a prolonged course of oral cipro. Imaging performed at the time of admission (a CT of the abdo/pelvis) revealed: Bilateral double-J internal ureteral stents in adequate position with persistent mild bilateral hydronephrosis   She complete radiation therapy from 01/29/15 to 03/10/15 with 50Gy of external beam radiation and IMRT. She tolerated therapy well with minor skin irritation.  She had her ureteral stents removed in June, 2016, however, then developed right ureteral obstruction, hydroureter and pain. She went to the OR  with Dr Tresa Moore on 03/20/15 for ureteral dilation and right stent placement (the left was draining well). No tumor was seen on cysto.   Right ureteral stent now out. No vaginal bleeding. Stoma working well. Gaining weight. Mood improved.   CT abdo/pelvis on 05/14/15 showed: no new lesions, stable thickening in right pelvis/distal right ureter consistent with radiation effect.  She has had bronchitis symptoms for >48month s/p levaquin trial.  The patient's CT in January 2017 showed progression of her right ureteral obstruction after stent removal. Dr MTresa Mooretook her to the OR on 09/05/15 for a cystoscopy, retrograde pyelogram and right ureteral stent placement.   The CT on 08/18/15 showed decreased attenuation of the right renal parenchyma, right hydronephrosis that was severe no excretion of contrast on that for graphic phase imaging. The perivascular soft tissue thickening in the pelvis and presacral regions grossly stable consistent with radiation changes.  Interval Hx:  The patient was diagnosed with ER/PR positive left breast cancer in April 2017. Surgery is scheduled for May. She is having a bilateral mastectomy with expander reconstruction. Her Ureteral stent was changed on the right in April 2017. She denies vaginal bleeding. Feels pelvic heaviness with standing for a long time and discomfort with prolonged sitting.  Review of systems: Constitutional:  She has no weight gain or weight loss. She has a low grade fever no chills. Eyes: No blurred vision Ears, Nose, Mouth, Throat: No dizziness, headaches or changes in hearing. No mouth sores. Cardiovascular: No chest pain, palpitations or edema. Respiratory:  No shortness of breath, wheezing  Gastrointestinal: She has normal bowel movements trhough stoma without diarrhea or constipation. She denies any nausea or  vomiting. She denies blood in her stool or heart burn. Genitourinary:  See HPI Musculoskeletal: Denies muscle weakness or joint pains.   Skin:  She has no skin changes, rashes or itching Neurological:  Denies dizziness or headaches. No neuropathy, no numbness or tingling. Psychiatric:  She denies depression or anxiety. Hematologic/Lymphatic:   No easy bruising or bleeding  Allergies  Allergen Reactions  . Penicillins Swelling    Facial swelling Has patient had a PCN reaction causing immediate rash, facial/tongue/throat swelling, SOB or lightheadedness with hypotension: Yes Has patient had a PCN reaction causing severe rash involving mucus membranes or skin necrosis: Yes Has patient had a PCN reaction that required hospitalization No Has patient had a PCN reaction occurring within the last 10 years: No If all of the above answers are "NO", then may proceed with Cephalosporin use.   [name] Kitchen Ultram [Tramadol] Hives  . Adhesive [Tape]     blisters  . Cefaclor Rash    Ceclor  . Erythromycin     Gastritis, abd cramps  . Trimethoprim Rash  . Pectin Rash    Pectin ring for stoma  . Sulfa Antibiotics Rash    Current Outpatient Prescriptions on File Prior to Visit  Medication Sig Dispense Refill  . Biotin 5 MG TABS Take 5 mg by mouth every morning.     . Canagliflozin (INVOKANA) 300 MG TABS Take 300 mg by mouth every morning.     . Cholecalciferol (VITAMIN D3) 10000 UNITS capsule Take 10,000 Units by mouth once a week. Sundays    . diphenhydrAMINE (BENADRYL) 25 MG tablet Take 25 mg by mouth every 4 (four) hours as needed for itching, allergies or sleep. Reported on 09/15/2015    . filgrastim (NEUPOGEN) 480 MCG/1.6ML injection Inject 1.6 ml under the skin every 3 days for life 16 mL 11  . hyaluronate sodium (RADIAPLEXRX) GEL Apply 1 application topically 2 (two) times daily as needed (scars). Reported on 09/15/2015    . levothyroxine (SYNTHROID) 175 MCG tablet Take 87.5-175 mcg by mouth daily before breakfast. 144mg daily except 87.569m on Sundays.    . [name] Kitchenoratadine (CLARITIN) 10 MG tablet Take 10 mg by mouth daily.    . metFORMIN  (GLUCOPHAGE) 1000 MG tablet Take 1,000 mg by mouth 2 (two) times daily with a meal.     . Multiple Vitamin (MULTIVITAMIN WITH MINERALS) TABS tablet Take 1 tablet by mouth daily.    . [name] Kitchenmega-3 acid ethyl esters (LOVAZA) 1 G capsule Take 1 g by mouth 2 (two) times daily.    . [name] Kitchenmeprazole (PRILOSEC) 20 MG capsule Take 20 mg by mouth at bedtime.     . [name] KitchenxyCODONE-acetaminophen (ROXICET) 5-325 MG tablet Take 1-2 tablets by mouth every 6 (six) hours as needed for moderate pain or severe pain. Post-operatively 20 tablet 0  . Polyethyl Glycol-Propyl Glycol (SYSTANE OP) Place 1 drop into both eyes at bedtime.     . rosuvastatin (CRESTOR) 10 MG tablet Take 10 mg by mouth every evening.     . senna-docusate (SENOKOT-S) 8.6-50 MG tablet Take 1 tablet by mouth 2 (two) times daily. While taking pain meds to prevent constipation 30 tablet 0  . sitaGLIPtin (JANUVIA) 100 MG tablet Take 100 mg by mouth daily.    . valsartan (DIOVAN) 80 MG tablet Take 80 mg by mouth daily.     No current facility-administered medications on file prior to visit.    Past Medical History  Diagnosis Date  . Benign essential HTN 12/14/2011  . Multiple  thyroid nodules     Managed by Dr. Harlow Asa  . GERD (gastroesophageal reflux disease)   . Depression   . Dysuria   . Radiation-induced dermatitis     contact dermatitis , radiation completed, rash only on ankles now.  . History of gastric polyp     2014  duodenum  . History of radiation therapy     01-29-2015 to 03-10-2015  pelvis 50.4Gy  . Hypothyroidism   . DM type 2 (diabetes mellitus, type 2) ([place])   . Hiatal hernia   . Anemia in neoplastic disease   . Chronic idiopathic neutropenia (HCC)     presumed related to chemotherapy March 2006--- followed by dr Alvy Bimler   . Ureteral stricture, right   . S/P colostomy ([place])   . Hydronephrosis   . History of renal stent   . Neutropenia ([place])   . Cancer of corpus uteri, except isthmus [facility]) oncologist-- dr Denman [name] and dr Alvy Bimler     Mar  2006 dx endometrial and ovarian cancer s/p  chemotheapy and surgery  . Breast cancer ([place]) 2017  . Complication of anesthesia     PONV  . PONV (postoperative nausea and vomiting)     Past Surgical History  Procedure Laterality Date  . Appendectomy    . Tonsillectomy    . Eye surgery      pytosis of eyelids-child  . Colonoscopy with propofol N/A 08/21/2013    Procedure: COLONOSCOPY WITH PROPOFOL;  Surgeon: Cleotis Nipper, MD;  Location: [facility];  Service: Endoscopy;  Laterality: N/A;  . Eus N/A 10/02/2014    Procedure: LOWER ENDOSCOPIC ULTRASOUND (EUS);  Surgeon: Arta Silence, MD;  Location: [facility];  Service: Endoscopy;  Laterality: N/A;  . Robotic assisted lap vaginal hysterectomy N/A 11/19/2014    Procedure: ROBOTIC LYSIS OF ADHESIONS, CONVERTED TO LAPAROTOMY RADICAL UPPER VAGINECTOMY,LOW ANTERIOR BOWEL RESECTION, COLOSTOMY, BILATERAL URETERAL STENT PLACEMENT AND CYSTONOMY CLOSURE;  Surgeon: Everitt Amber, MD;  Location: [facility];  Service: Gynecology;  Laterality: N/A;  . Ostomy N/A 11/19/2014    Procedure: OSTOMY;  Surgeon: Michael [name], MD;  Location: [facility];  Service: General;  Laterality: N/A;  . Colostomy takedown N/A 12/04/2014    Procedure: LAPROSCOPIC LYSIS OF ADHESIONS, SPLENIC MOBILIZATION, RELOCATION OF COLOSTOMY, DEBRIDEMENT INITIAL COLOSTOMY SITE;  Surgeon: Michael [name], MD;  Location: [facility];  Service: General;  Laterality: N/A;  . Cystoscopy with retrograde pyelogram, ureteroscopy and stent placement Right 03/20/2015    Procedure: CYSTOSCOPY WITH RETROGRADE PYELOGRAM, URETEROSCOPY WITH BALLOON DILATION AND STENT PLACEMENT ON RIGHT;  Surgeon: Alexis Frock, MD;  Location: [facility];  Service: Urology;  Laterality: Right;  . Excision soft tissue mass right foreman  12-08-2006  . Abdominal hysterectomy  2006    staging for Ovarian cancer  . Laparoscopic cholecystectomy    . Cystoscopy with retrograde pyelogram, ureteroscopy and stent placement Right  05/02/2015    Procedure: CYSTOSCOPY WITH RIGHT RETROGRADE PYELOGRAM,  DIAGNOSTIC URETEROSCOPY AND STENT PULL ;  Surgeon: Alexis Frock, MD;  Location: [facility];  Service: Urology;  Laterality: Right;  . Cystoscopy with retrograde pyelogram, ureteroscopy and stent placement Right 09/05/2015    Procedure: CYSTOSCOPY WITH RETROGRADE PYELOGRAM,  AND STENT PLACEMENT;  Surgeon: Alexis Frock, MD;  Location: [facility];  Service: Urology;  Laterality: Right;  . Cystoscopy w/ retrogrades Right 11/21/2015    Procedure: CYSTOSCOPY WITH RETROGRADE PYELOGRAM;  Surgeon: Alexis Frock, MD;  Location: [facility];  Service: Urology;  Laterality: Right;  . Cystoscopy w/  ureteral stent placement Right 11/21/2015    Procedure: CYSTOSCOPY WITH STENT REPLACEMENT;  Surgeon: Alexis Frock, MD;  Location: [facility];  Service: Urology;  Laterality: Right;    Family History  Problem Relation Age of Onset  . Cancer Mother 46    stomach ca  . Hypertension Mother   . Cancer Father 76    prostate ca  . Diabetes Father   . Heart disease Father     CABG  . Diabetes Sister   . Hypertension Brother y-10  . Heart disease Brother     CABG  . Diabetes Brother   . Breast cancer Maternal Aunt     dx in her 70s  . Lymphoma Paternal Aunt   . Brain cancer Paternal Grandfather   . Ovarian cancer Other     Social History   Social History  . Marital Status: Married    Spouse Name: N/A  . Number of Children: 1  . Years of Education: N/A   Occupational History  . retired Therapist, sports from [place] History Main Topics  . Smoking status: Never Smoker   . Smokeless tobacco: Never Used  . Alcohol Use: Yes     Comment: rare social  . Drug Use: No  . Sexual Activity: Not Currently   Other Topics Concern  . Not on file   Social History Narrative   Exercise-- has not gotten back into it since cancer came back   CBC    Component Value Date/Time   WBC 4.6 12/01/2015 1348   WBC 5.0 11/21/2015 0935   WBC 2.4*  08/25/2014 1541   RBC 4.09 12/01/2015 1348   RBC 4.03 11/21/2015 0935   RBC 3.68* 12/13/2014 1235   RBC 4.75 08/25/2014 1541   HGB 11.3* 12/01/2015 1348   HGB 11.0* 11/21/2015 0935   HGB 13.0 08/25/2014 1541   HCT 35.9 12/01/2015 1348   HCT 33.7* 11/21/2015 0935   HCT 41.3 08/25/2014 1541   PLT 260 12/01/2015 1348   PLT 318 11/21/2015 0935   MCV 87.8 12/01/2015 1348   MCV 83.6 11/21/2015 0935   MCV 86.8 08/25/2014 1541   MCH 27.6 12/01/2015 1348   MCH 27.3 11/21/2015 0935   MCH 27.5 08/25/2014 1541   MCHC 31.5 12/01/2015 1348   MCHC 32.6 11/21/2015 0935   MCHC 31.6* 08/25/2014 1541   RDW 15.9* 12/01/2015 1348   RDW 14.9 11/21/2015 0935   LYMPHSABS 0.8* 12/01/2015 1348   LYMPHSABS 0.3* 11/21/2015 0935   MONOABS 0.4 12/01/2015 1348   MONOABS 0.6 11/21/2015 0935   EOSABS 0.1 12/01/2015 1348   EOSABS 0.1 11/21/2015 0935   BASOSABS 0.0 12/01/2015 1348   BASOSABS 0.0 11/21/2015 0935     Physical Exam: There were no vitals taken for this visit. General: Well dressed, well nourished in no apparent distress.   HEENT:  Normocephalic and atraumatic, no lesions.  Extraocular muscles intact. Sclerae anicteric. Pupils equal, round, reactive. No mouth sores or ulcers. Thyroid is normal size, not nodular, midline. Skin:  No lesions or rashes. Breasts:  deferred Lungs:  Clear to auscultation bilaterally.  No wheezes. Cardiovascular:  Regular rate and rhythm.  No murmurs or rubs. Abdomen:  Soft, nontender, nondistended.  No palpable masses.  No hepatosplenomegaly.  No ascites. Normal bowel sounds.  No hernias.  Incision is healed. Laparoscopic incision healed. Stomal site healed. Stomal appliance in situ. Genitourinary: vaginal cuff intact without blood or fluid in the vault. Some synechaie and shortening of upper vagina.  No thickening or masses to suggest recurrence. Woodiness/hardness of posterior half of pelvis (consistent with radiation fibrosis) - no distinct mass. Rectal exam: stump  in tact, no masses Extremities: No cyanosis, clubbing or edema.  No calf tenderness or erythema. No palpable cords. Psychiatric: Mood and affect are appropriate. Neurological: Awake, alert and oriented x 3. Sensation is intact, no neuropathy.  Musculoskeletal: No pain, normal strength and range of motion.  [name] Eva, MD  CC: Dr Tresa Moore, Dr Alvy Bimler, Dr Theressa Millard

## 2015-12-01 NOTE — Patient Instructions (Signed)
Plan to have a CT scan in July and follow up with Dr. Denman [name] in August or sooner if needed.  Please call for any questions or concerns.

## 2015-12-02 ENCOUNTER — Telehealth: Payer: Self-pay | Admitting: *Deleted

## 2015-12-02 IMAGING — RF DG CYSTOGRAM 3+V
15 of 24 series · 15 of 24 positions shown · non-contrast
Comparison: No priors.

CLINICAL DATA: 59-year-old female status post bladder repair and
tumor removal 3 weeks ago.

EXAM:
CYSTOGRAM
TECHNIQUE: After catheterization of the urinary bladder following sterile
technique the bladder was filled with 200 mL Cysto-Hypaque 30% by
drip infusion. Serial spot images were obtained during bladder
filling and post draining.
FLUOROSCOPY TIME:  3 minutes and 1 second

[Series 1: run · 1 of 1 slices shown (1 of 15)]
[im 1/1]
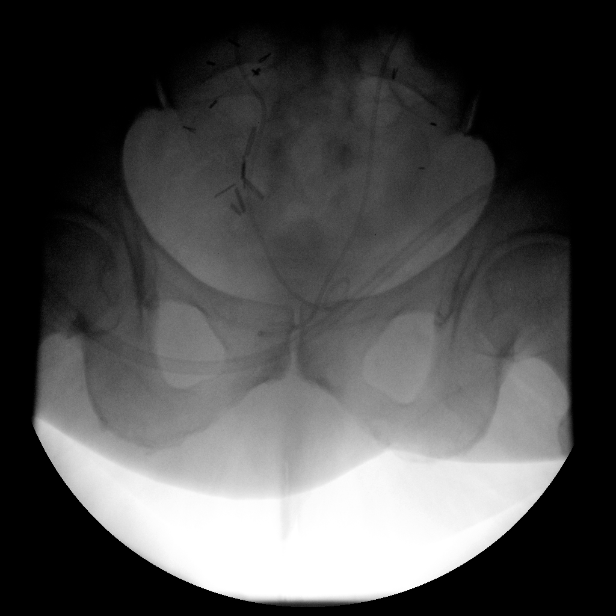

[Series 2: run · 1 of 1 slices shown (2 of 15)]
[im 1/1]
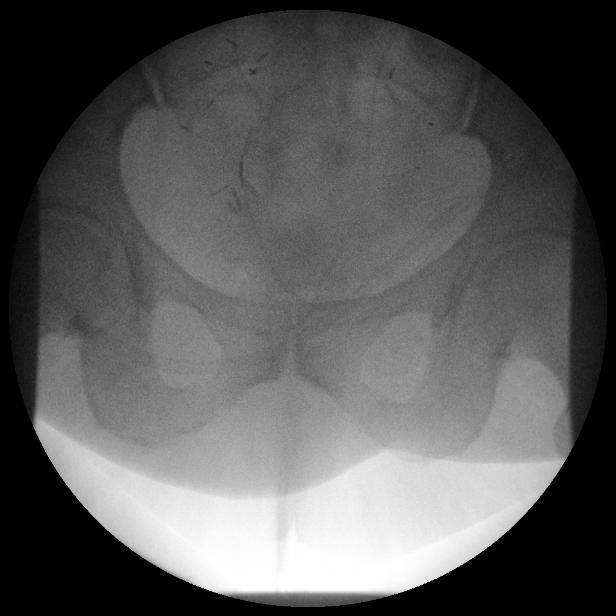

[Series 3: run · 1 of 1 slices shown (3 of 15)]
[im 1/1]
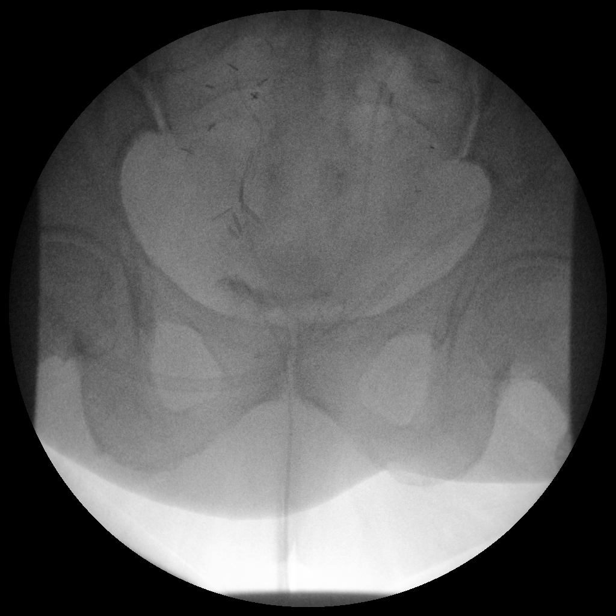

[Series 3: run · 1 of 1 slices shown (4 of 15)]
[im 1/1]
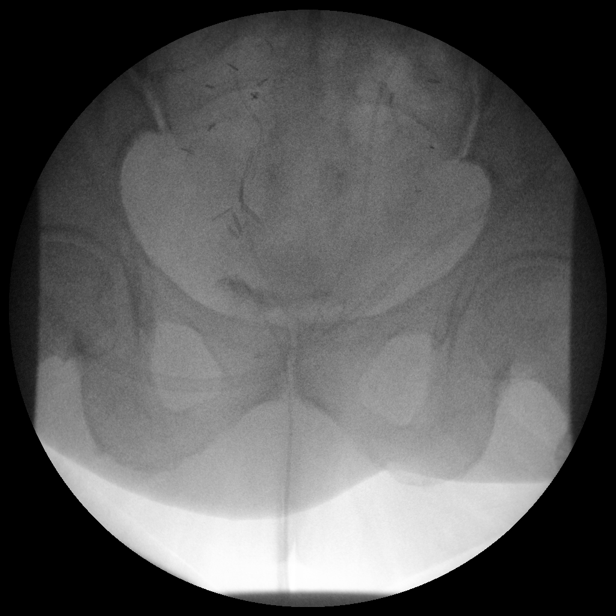

[Series 4: run · 1 of 1 slices shown (5 of 15)]
[im 1/1]
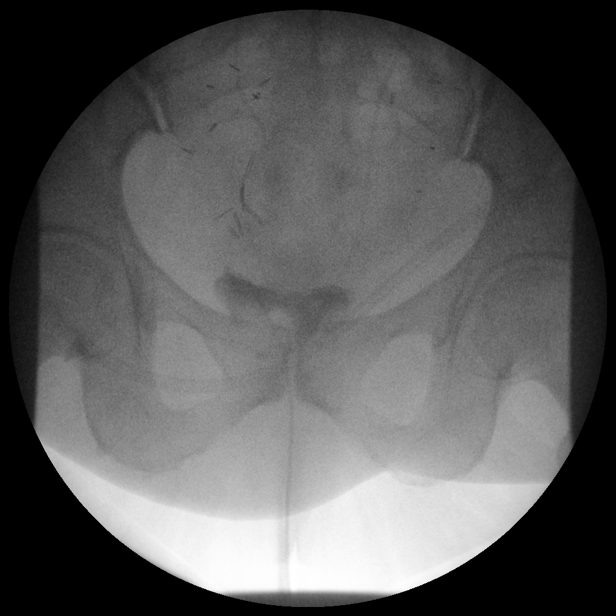

[Series 5: run · 1 of 1 slices shown (6 of 15)]
[im 1/1]
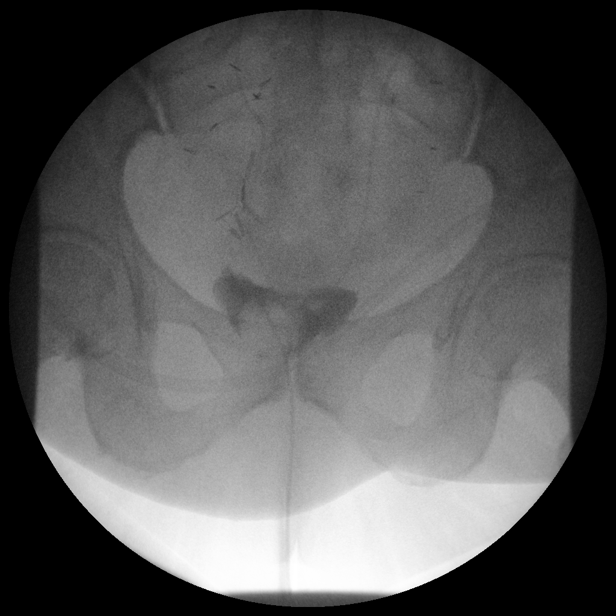

[Series 6: run · 1 of 1 slices shown (7 of 15)]
[im 1/1]
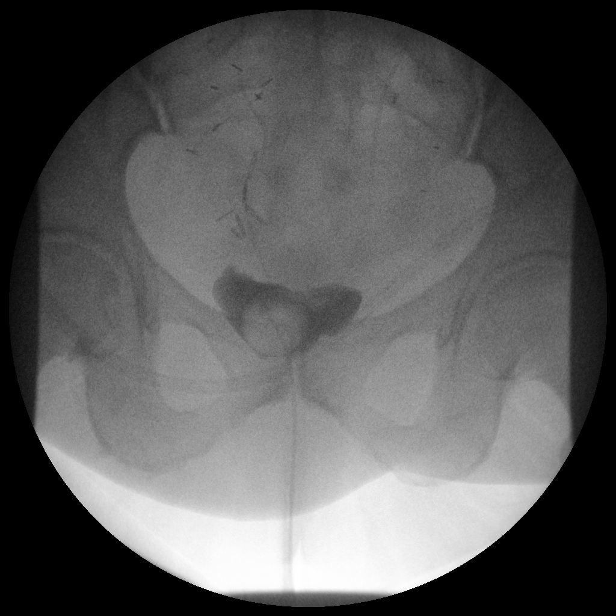

[Series 7: run · 1 of 1 slices shown (8 of 15)]
[im 1/1]
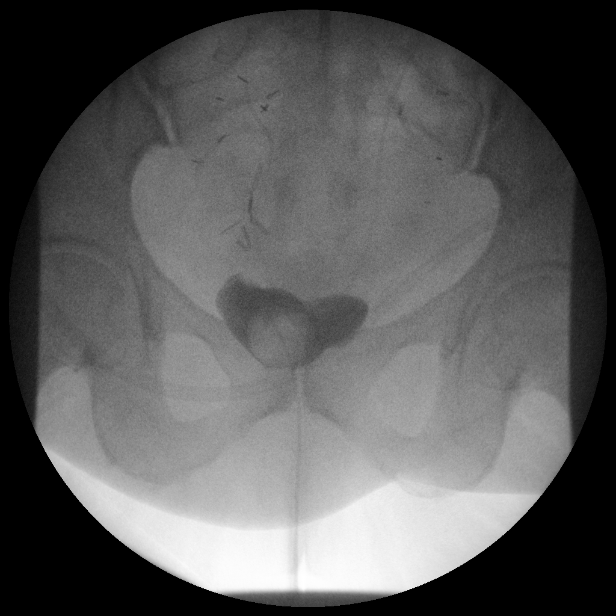

[Series 7: run · 1 of 1 slices shown (9 of 15)]
[im 1/1]
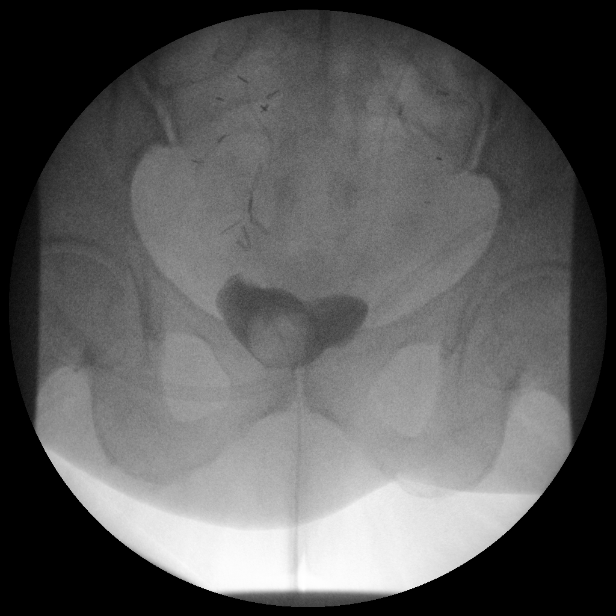

[Series 8: run · 1 of 1 slices shown (10 of 15)]
[im 1/1]
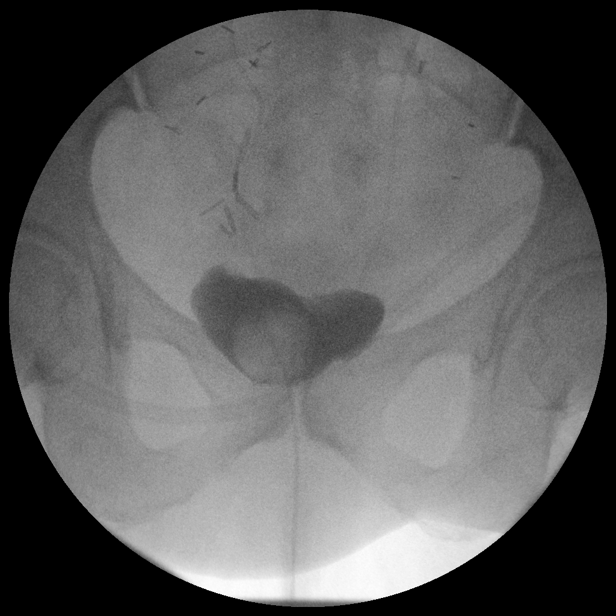

[Series 9: run · 1 of 1 slices shown (11 of 15)]
[im 1/1]
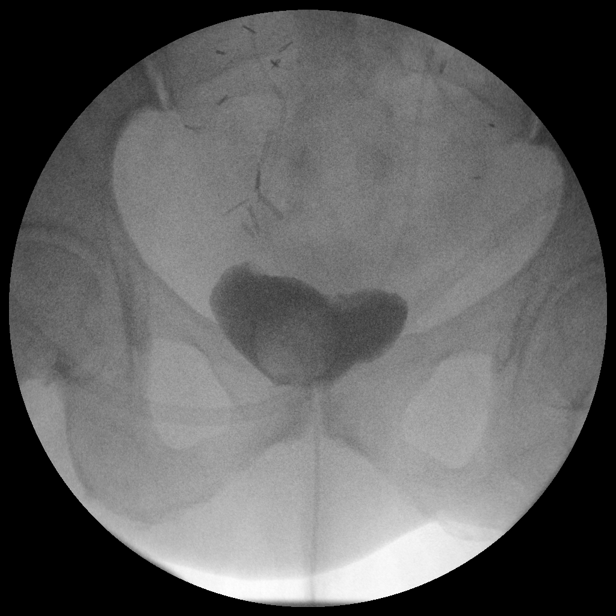

[Series 10: run · 1 of 1 slices shown (12 of 15)]
[im 1/1]
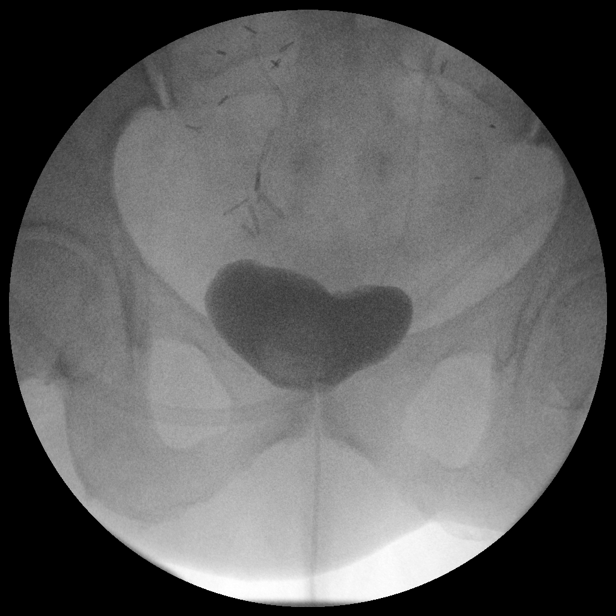

[Series 11: run · 1 of 1 slices shown (13 of 15)]
[im 1/1]
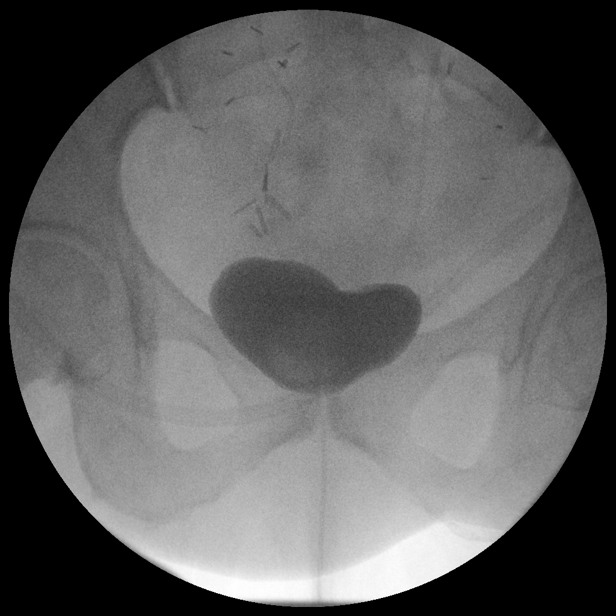

[Series 11: run · 1 of 1 slices shown (14 of 15)]
[im 1/1]
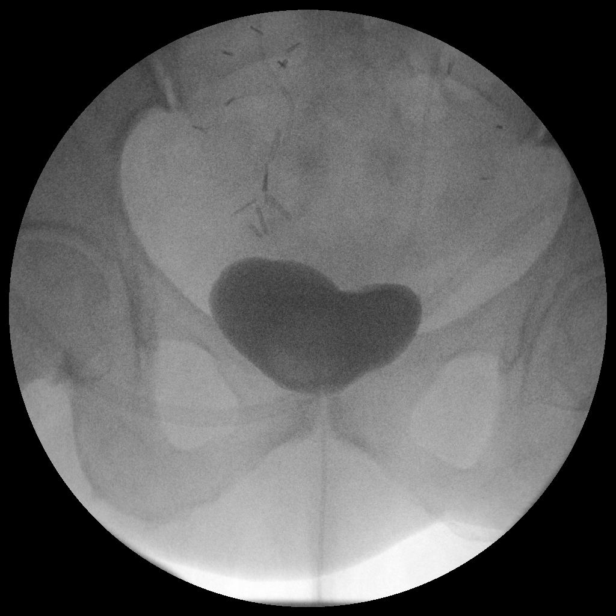

[Series 12: run · 1 of 1 slices shown (15 of 15)]
[im 1/1]
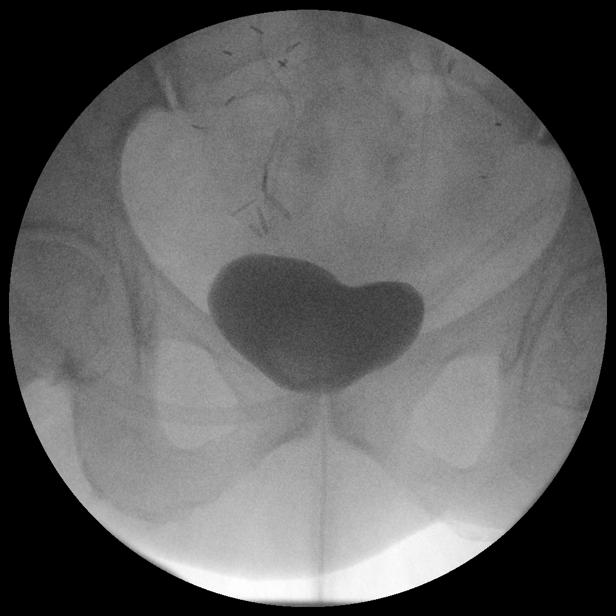

[15 of 24 positions shown; findings below may reference images not displayed]

FINDINGS: Initial images of the pelvis demonstrated the distal ends of
bilateral double-J ureteral stents, and demonstrated multiple
surgical clips. Urinary bladder was normal in appearance, without
evidence of extravasation at any point during the examination.
IMPRESSION: 1. Normal cystogram, without evidence of extravasation into the
peritoneal or subperitoneal spaces.

## 2015-12-02 NOTE — Telephone Encounter (Signed)
Continue injection every 6 days, recheck next Monday Please place POF

## 2015-12-02 NOTE — Telephone Encounter (Signed)
Pt states she had last injection on Thursday, 4/27. Labs yesterday. Wants to know if she is to give self injection today.

## 2015-12-02 NOTE — Telephone Encounter (Signed)
Pt notified to give self injection every 6 days. Will have labs on Monday

## 2015-12-03 NOTE — Pre-Procedure Instructions (Signed)
[facility] [place]  12/03/2015      [facility] [address], [place] [address] [place] [address] Phone: [phone] Fax: [phone]  [facility] [place], [place] [address] Phone: [phone] Fax: [phone]  [facility], [place] [address] [place] [address] Phone: [phone] Fax: [phone]  [place], [place] [address] Phone: [phone] Fax: [phone]    Your procedure is scheduled on Thursday, May 11th, 2017.  [facility] Admitting at 5:30 A.M.   Call this number if you have problems the morning of surgery:  [phone]   Remember:  Do not eat food or drink liquids after midnight.   Take these medicines the morning of surgery with A SIP OF WATER: Levothyroxine (Synthroid), Loratadine (Claritin), Omeprazole (Prilosec).   7 days prior to surgery, stop taking: Aspirin, NSAIDS, Aleve, Naproxen, Ibuprofen, Advil, Motrin, BC's, Goody's, Fish oil, all herbal medications, and all vitamins.   WHAT DO I DO ABOUT MY DIABETES MEDICATION?   [name] Kitchen Do not take oral diabetes medicines (pills) the morning of surgery.  Do not take Invokana, Metformin, or Januvia the morning of surgery.      Do not wear jewelry, make-up or nail polish.  Do not wear lotions, powders, or perfumes.  You may NOT wear deodorant.  Do not shave 48 hours prior to surgery.    Do not bring valuables to the hospital.   [facility] is not responsible for any belongings or valuables.  Contacts, dentures or bridgework may not be worn into surgery.  Leave your suitcase in the car.  After surgery it may be brought to your room.  For patients admitted to the hospital, discharge time will be determined by your  treatment team.  Patients discharged the day of surgery will not be allowed to drive home.   Special instructions:  See attached.   Please read over the following fact sheets that you were given. Pain Booklet, Coughing and Deep Breathing and Surgical Site Infection Prevention     How to Manage Your Diabetes Before and After Surgery  Why is it important to control my blood sugar before and after surgery? . Improving blood sugar levels before and after surgery helps healing and can limit problems. . A way of improving blood sugar control is eating a healthy diet by: o  Eating less sugar and carbohydrates o  Increasing activity/exercise o  Talking with your doctor about reaching your blood sugar goals . High blood sugars (greater than 180 mg/dL) can raise your risk of infections and slow your recovery, so you will need to focus on controlling your diabetes during the weeks before surgery. . Make sure that the doctor who takes care of your diabetes knows about your planned surgery including the date and location.  How do I manage my blood sugar before surgery? . Check your blood sugar at least 4 times a day, starting 2 days before surgery, to make sure that the level is not too high or low. o Check your blood sugar the morning of your surgery when you wake up and every 2 hours until you get to the Short Stay unit. . If your blood sugar is less than 70  mg/dL, you will need to treat for low blood sugar: o Do not take insulin. o Treat a low blood sugar (less than 70 mg/dL) with  cup of clear juice (cranberry or apple), 4 glucose tablets, OR glucose gel. o Recheck blood sugar in 15 minutes after treatment (to make sure it is greater than 70 mg/dL). If your blood sugar is not greater than 70 mg/dL on recheck, call [phone] for further instructions. . Report your blood sugar to the short stay nurse when you get to Short Stay.  . If you are admitted to the hospital after surgery: o Your  blood sugar will be checked by the staff and you will probably be given insulin after surgery (instead of oral diabetes medicines) to make sure you have good blood sugar levels. o The goal for blood sugar control after surgery is 80-180 mg/dL.

## 2015-12-04 ENCOUNTER — Encounter (HOSPITAL_COMMUNITY)
Admission: RE | Admit: 2015-12-04 | Discharge: 2015-12-04 | Disposition: A | Discharge status: Home / Self Care | Payer: BLUE CROSS/BLUE SHIELD | Source: Ambulatory Visit | Attending: General Surgery | Admitting: General Surgery

## 2015-12-04 ENCOUNTER — Encounter (HOSPITAL_COMMUNITY): Payer: Self-pay

## 2015-12-04 DIAGNOSIS — Z01812 Encounter for preprocedural laboratory examination: Secondary | ICD-10-CM | POA: Diagnosis not present

## 2015-12-04 DIAGNOSIS — Z01818 Encounter for other preprocedural examination: Secondary | ICD-10-CM | POA: Diagnosis not present

## 2015-12-04 DIAGNOSIS — E119 Type 2 diabetes mellitus without complications: Secondary | ICD-10-CM | POA: Insufficient documentation

## 2015-12-04 DIAGNOSIS — C50912 Malignant neoplasm of unspecified site of left female breast: Secondary | ICD-10-CM | POA: Diagnosis not present

## 2015-12-04 HISTORY — DX: Presence of spectacles and contact lenses: Z97.3

## 2015-12-04 HISTORY — DX: Other seasonal allergic rhinitis: J30.2

## 2015-12-04 LAB — BASIC METABOLIC PANEL
Anion gap: 12 (ref 5–15)
BUN: 21 mg/dL — AB (ref 6–20)
CALCIUM: 9.5 mg/dL (ref 8.9–10.3)
CO2: 27 mmol/L (ref 22–32)
Chloride: 101 mmol/L (ref 101–111)
Creatinine, Ser: 1.16 mg/dL — ABNORMAL HIGH (ref 0.44–1.00)
GFR calc Af Amer: 58 mL/min — ABNORMAL LOW (ref >60)
GFR, EST NON AFRICAN AMERICAN: 50 mL/min — AB (ref >60)
GLUCOSE: 95 mg/dL (ref 65–99)
POTASSIUM: 3.9 mmol/L (ref 3.5–5.1)
Sodium: 140 mmol/L (ref 135–145)

## 2015-12-04 LAB — GLUCOSE, CAPILLARY: GLUCOSE-CAPILLARY: 88 mg/dL (ref 65–99)

## 2015-12-04 NOTE — Progress Notes (Signed)
PCP - Dr. [name] Cardiologist - denies Endocrinologist - Dr. Legrand Como Altheimer  EKG- 12/04/15 CXR - denies Echo/stress/cardiac cath - denies  Patient denies chest pain and shortness of breath at PAT appointment.    Patient states that she checks her blood sugar daily and that her fasting glucose is 76-120.  Patient informed nurse that A1C was drawn at the end of March and was 5.9.  (results have been requested from Dr. Elyse Hsu).   CBC with Diff was collected 12/01/15.  Results are in [place].

## 2015-12-08 ENCOUNTER — Other Ambulatory Visit (HOSPITAL_BASED_OUTPATIENT_CLINIC_OR_DEPARTMENT_OTHER): Payer: BLUE CROSS/BLUE SHIELD

## 2015-12-08 ENCOUNTER — Telehealth: Payer: Self-pay | Admitting: *Deleted

## 2015-12-08 DIAGNOSIS — D709 Neutropenia, unspecified: Secondary | ICD-10-CM

## 2015-12-08 LAB — CBC & DIFF AND RETIC
BASO%: 0.6 % (ref 0.0–2.0)
Basophils Absolute: 0 10*3/uL (ref 0.0–0.1)
EOS%: 1.9 % (ref 0.0–7.0)
Eosinophils Absolute: 0.1 10*3/uL (ref 0.0–0.5)
HCT: 37.3 % (ref 34.8–46.6)
HGB: 11.7 g/dL (ref 11.6–15.9)
IMMATURE RETIC FRACT: 8.1 % (ref 1.60–10.00)
LYMPH%: 21.8 % (ref 14.0–49.7)
MCH: 27.3 pg (ref 25.1–34.0)
MCHC: 31.4 g/dL — ABNORMAL LOW (ref 31.5–36.0)
MCV: 86.9 fL (ref 79.5–101.0)
MONO#: 0.5 10*3/uL (ref 0.1–0.9)
MONO%: 14.9 % — AB (ref 0.0–14.0)
NEUT%: 60.8 % (ref 38.4–76.8)
NEUTROS ABS: 1.9 10*3/uL (ref 1.5–6.5)
NRBC: 0 % (ref 0–0)
PLATELETS: 238 10*3/uL (ref 145–400)
RBC: 4.29 10*6/uL (ref 3.70–5.45)
RDW: 16.8 % — AB (ref 11.2–14.5)
Retic %: 1.94 % (ref 0.70–2.10)
Retic Ct Abs: 83.23 10*3/uL (ref 33.70–90.70)
WBC: 3.1 10*3/uL — AB (ref 3.9–10.3)
lymph#: 0.7 10*3/uL — ABNORMAL LOW (ref 0.9–3.3)

## 2015-12-08 NOTE — Telephone Encounter (Signed)
-----   Message from Heath Lark, MD sent at 12/08/2015  2:46 PM EDT ----- Regarding: labs Labs showed she is responding well to Neupogen every 6 days. No need further monitoring. Continue the same ----- Message -----    From: Lab in [facility] Interface    Sent: 12/08/2015   2:05 PM      To: Heath Lark, MD

## 2015-12-08 NOTE — Telephone Encounter (Signed)
Informed pt of Dr. Calton Dach note below and her lab results.  Pt's next appt is on 6/1 for lab and MD.   She verbalized understanding.

## 2015-12-11 ENCOUNTER — Ambulatory Visit (HOSPITAL_COMMUNITY): Payer: BLUE CROSS/BLUE SHIELD | Admitting: Certified Registered Nurse Anesthetist

## 2015-12-11 ENCOUNTER — Encounter (HOSPITAL_COMMUNITY)
Admission: RE | Admit: 2015-12-11 | Discharge: 2015-12-11 | Disposition: A | Discharge status: Home / Self Care | Payer: BLUE CROSS/BLUE SHIELD | Source: Ambulatory Visit | Attending: General Surgery | Admitting: General Surgery

## 2015-12-11 ENCOUNTER — Encounter (HOSPITAL_COMMUNITY)
Admission: RE | Disposition: A | Discharge status: Home / Self Care | Payer: Self-pay | Source: Ambulatory Visit | Attending: General Surgery

## 2015-12-11 ENCOUNTER — Encounter (HOSPITAL_COMMUNITY): Payer: Self-pay | Admitting: *Deleted

## 2015-12-11 ENCOUNTER — Inpatient Hospital Stay (HOSPITAL_COMMUNITY)
Admission: RE | Admit: 2015-12-11 | Discharge: 2015-12-12 | DRG: 580 | Disposition: A | Discharge status: Home / Self Care | Payer: BLUE CROSS/BLUE SHIELD | Source: Ambulatory Visit | Attending: General Surgery | Admitting: General Surgery

## 2015-12-11 DIAGNOSIS — Z4001 Encounter for prophylactic removal of breast: Secondary | ICD-10-CM | POA: Diagnosis present

## 2015-12-11 DIAGNOSIS — Z90710 Acquired absence of both cervix and uterus: Secondary | ICD-10-CM | POA: Diagnosis not present

## 2015-12-11 DIAGNOSIS — C50212 Malignant neoplasm of upper-inner quadrant of left female breast: Secondary | ICD-10-CM | POA: Diagnosis not present

## 2015-12-11 DIAGNOSIS — Z91048 Other nonmedicinal substance allergy status: Secondary | ICD-10-CM | POA: Diagnosis not present

## 2015-12-11 DIAGNOSIS — Z9221 Personal history of antineoplastic chemotherapy: Secondary | ICD-10-CM

## 2015-12-11 DIAGNOSIS — Z883 Allergy status to other anti-infective agents status: Secondary | ICD-10-CM | POA: Diagnosis not present

## 2015-12-11 DIAGNOSIS — Z882 Allergy status to sulfonamides status: Secondary | ICD-10-CM | POA: Diagnosis not present

## 2015-12-11 DIAGNOSIS — T451X5A Adverse effect of antineoplastic and immunosuppressive drugs, initial encounter: Secondary | ICD-10-CM | POA: Diagnosis present

## 2015-12-11 DIAGNOSIS — N6011 Diffuse cystic mastopathy of right breast: Secondary | ICD-10-CM | POA: Diagnosis not present

## 2015-12-11 DIAGNOSIS — Z9013 Acquired absence of bilateral breasts and nipples: Secondary | ICD-10-CM | POA: Diagnosis not present

## 2015-12-11 DIAGNOSIS — Z8543 Personal history of malignant neoplasm of ovary: Secondary | ICD-10-CM

## 2015-12-11 DIAGNOSIS — Z933 Colostomy status: Secondary | ICD-10-CM

## 2015-12-11 DIAGNOSIS — C50912 Malignant neoplasm of unspecified site of left female breast: Secondary | ICD-10-CM | POA: Diagnosis present

## 2015-12-11 DIAGNOSIS — Z6841 Body Mass Index (BMI) 40.0 and over, adult: Secondary | ICD-10-CM

## 2015-12-11 DIAGNOSIS — C7911 Secondary malignant neoplasm of bladder: Secondary | ICD-10-CM | POA: Diagnosis not present

## 2015-12-11 DIAGNOSIS — Z880 Allergy status to penicillin: Secondary | ICD-10-CM | POA: Diagnosis not present

## 2015-12-11 DIAGNOSIS — Z853 Personal history of malignant neoplasm of breast: Secondary | ICD-10-CM | POA: Diagnosis not present

## 2015-12-11 DIAGNOSIS — Z90722 Acquired absence of ovaries, bilateral: Secondary | ICD-10-CM | POA: Diagnosis not present

## 2015-12-11 DIAGNOSIS — Z8544 Personal history of malignant neoplasm of other female genital organs: Secondary | ICD-10-CM

## 2015-12-11 DIAGNOSIS — G8918 Other acute postprocedural pain: Secondary | ICD-10-CM | POA: Diagnosis not present

## 2015-12-11 DIAGNOSIS — Z803 Family history of malignant neoplasm of breast: Secondary | ICD-10-CM

## 2015-12-11 DIAGNOSIS — Z800 Family history of malignant neoplasm of digestive organs: Secondary | ICD-10-CM

## 2015-12-11 DIAGNOSIS — Z881 Allergy status to other antibiotic agents status: Secondary | ICD-10-CM | POA: Diagnosis not present

## 2015-12-11 DIAGNOSIS — D701 Agranulocytosis secondary to cancer chemotherapy: Secondary | ICD-10-CM | POA: Diagnosis present

## 2015-12-11 DIAGNOSIS — R0789 Other chest pain: Secondary | ICD-10-CM | POA: Diagnosis not present

## 2015-12-11 HISTORY — PX: BREAST RECONSTRUCTION WITH PLACEMENT OF TISSUE EXPANDER AND FLEX HD (ACELLULAR HYDRATED DERMIS): SHX6295

## 2015-12-11 HISTORY — DX: Malignant neoplasm of unspecified site of left female breast: C50.912

## 2015-12-11 HISTORY — PX: MASTECTOMY W/ SENTINEL NODE BIOPSY: SHX2001

## 2015-12-11 LAB — GLUCOSE, CAPILLARY
GLUCOSE-CAPILLARY: 108 mg/dL — AB (ref 65–99)
GLUCOSE-CAPILLARY: 132 mg/dL — AB (ref 65–99)
Glucose-Capillary: 186 mg/dL — ABNORMAL HIGH (ref 65–99)
Glucose-Capillary: 194 mg/dL — ABNORMAL HIGH (ref 65–99)

## 2015-12-11 SURGERY — MASTECTOMY WITH SENTINEL LYMPH NODE BIOPSY
Anesthesia: General | Site: Chest | Laterality: Bilateral

## 2015-12-11 MED ORDER — FENTANYL CITRATE (PF) 100 MCG/2ML IJ SOLN
25.0000 ug | INTRAMUSCULAR | Status: DC | PRN
  Administered 2015-12-11 (×3): 50 ug via INTRAVENOUS

## 2015-12-11 MED ORDER — WHITE PETROLATUM GEL
Status: AC
  Administered 2015-12-11: 15:00:00
  Filled 2015-12-11: qty 1

## 2015-12-11 MED ORDER — PROPOFOL 10 MG/ML IV BOLUS
INTRAVENOUS | Status: AC
  Filled 2015-12-11: qty 20

## 2015-12-11 MED ORDER — POLYVINYL ALCOHOL 1.4 % OP SOLN
1.0000 [drp] | Freq: Every day | OPHTHALMIC | Status: DC
  Filled 2015-12-11 (×2): qty 15

## 2015-12-11 MED ORDER — PANTOPRAZOLE SODIUM 40 MG PO TBEC
40.0000 mg | DELAYED_RELEASE_TABLET | Freq: Every day | ORAL | Status: DC
  Administered 2015-12-11: 40 mg via ORAL
  Filled 2015-12-11 (×2): qty 1

## 2015-12-11 MED ORDER — HYDRALAZINE HCL 20 MG/ML IJ SOLN: 10.0000 mg | INTRAMUSCULAR | Status: DC | PRN

## 2015-12-11 MED ORDER — ADULT MULTIVITAMIN W/MINERALS CH
1.0000 | ORAL_TABLET | Freq: Every day | ORAL | Status: DC
  Administered 2015-12-12: 1 via ORAL
  Filled 2015-12-11: qty 1

## 2015-12-11 MED ORDER — FENTANYL CITRATE (PF) 250 MCG/5ML IJ SOLN
INTRAMUSCULAR | Status: AC
  Filled 2015-12-11: qty 5

## 2015-12-11 MED ORDER — SCOPOLAMINE 1 MG/3DAYS TD PT72
MEDICATED_PATCH | TRANSDERMAL | Status: AC
  Administered 2015-12-11: 1 via TRANSDERMAL
  Filled 2015-12-11: qty 1

## 2015-12-11 MED ORDER — ACETAMINOPHEN 10 MG/ML IV SOLN
INTRAVENOUS | Status: AC
  Filled 2015-12-11: qty 100

## 2015-12-11 MED ORDER — ROCURONIUM BROMIDE 100 MG/10ML IV SOLN
INTRAVENOUS | Status: DC | PRN
  Administered 2015-12-11: 20 mg via INTRAVENOUS
  Administered 2015-12-11: 10 mg via INTRAVENOUS
  Administered 2015-12-11: 30 mg via INTRAVENOUS

## 2015-12-11 MED ORDER — DIPHENHYDRAMINE HCL 50 MG/ML IJ SOLN
INTRAMUSCULAR | Status: DC | PRN
  Administered 2015-12-11: 25 mg via INTRAVENOUS

## 2015-12-11 MED ORDER — PHENYLEPHRINE HCL 10 MG/ML IJ SOLN
INTRAMUSCULAR | Status: DC | PRN
Start: 2015-12-11 — End: 2015-12-11
  Administered 2015-12-11: 120 ug via INTRAVENOUS
  Administered 2015-12-11: 80 ug via INTRAVENOUS

## 2015-12-11 MED ORDER — INSULIN ASPART 100 UNIT/ML ~~LOC~~ SOLN
0.0000 [IU] | Freq: Three times a day (TID) | SUBCUTANEOUS | Status: DC
  Administered 2015-12-11: 3 [IU] via SUBCUTANEOUS

## 2015-12-11 MED ORDER — TBO-FILGRASTIM 480 MCG/0.8ML ~~LOC~~ SOSY: 480.0000 ug | PREFILLED_SYRINGE | SUBCUTANEOUS | Status: DC

## 2015-12-11 MED ORDER — SODIUM CHLORIDE 0.9 % IR SOLN
Status: DC | PRN
  Administered 2015-12-11: 500 mL

## 2015-12-11 MED ORDER — CIPROFLOXACIN IN D5W 400 MG/200ML IV SOLN
400.0000 mg | Freq: Two times a day (BID) | INTRAVENOUS | Status: DC
  Administered 2015-12-11 – 2015-12-12 (×2): 400 mg via INTRAVENOUS
  Filled 2015-12-11 (×3): qty 200

## 2015-12-11 MED ORDER — TECHNETIUM TC 99M SULFUR COLLOID FILTERED
1.0000 | Freq: Once | INTRAVENOUS | Status: AC | PRN
  Administered 2015-12-11: 1 via INTRADERMAL

## 2015-12-11 MED ORDER — LEVOTHYROXINE SODIUM 175 MCG PO TABS: 87.5000 ug | ORAL_TABLET | Freq: Every day | ORAL | Status: DC

## 2015-12-11 MED ORDER — DIPHENHYDRAMINE HCL 25 MG PO TABS: 25.0000 mg | ORAL_TABLET | ORAL | Status: DC | PRN

## 2015-12-11 MED ORDER — LEVOTHYROXINE SODIUM 75 MCG PO TABS: 87.5000 ug | ORAL_TABLET | ORAL | Status: DC

## 2015-12-11 MED ORDER — ONDANSETRON HCL 4 MG/2ML IJ SOLN: 4.0000 mg | Freq: Four times a day (QID) | INTRAMUSCULAR | Status: DC | PRN

## 2015-12-11 MED ORDER — SODIUM CHLORIDE 0.9 % IR SOLN
Status: DC | PRN
  Administered 2015-12-11 (×2): 500 mL

## 2015-12-11 MED ORDER — SODIUM CHLORIDE 0.9 % IJ SOLN
INTRAMUSCULAR | Status: DC | PRN
  Administered 2015-12-11: 120 mL

## 2015-12-11 MED ORDER — ONDANSETRON HCL 4 MG/2ML IJ SOLN
INTRAMUSCULAR | Status: DC | PRN
Start: 2015-12-11 — End: 2015-12-11
  Administered 2015-12-11: 4 mg via INTRAVENOUS

## 2015-12-11 MED ORDER — ROCURONIUM BROMIDE 50 MG/5ML IV SOLN
INTRAVENOUS | Status: AC
  Filled 2015-12-11: qty 1

## 2015-12-11 MED ORDER — SODIUM CHLORIDE 0.9 % IJ SOLN
INTRAMUSCULAR | Status: AC
  Filled 2015-12-11: qty 10

## 2015-12-11 MED ORDER — ACETAMINOPHEN 650 MG RE SUPP: 650.0000 mg | Freq: Four times a day (QID) | RECTAL | Status: DC | PRN

## 2015-12-11 MED ORDER — FENTANYL CITRATE (PF) 100 MCG/2ML IJ SOLN
INTRAMUSCULAR | Status: AC
  Filled 2015-12-11: qty 2

## 2015-12-11 MED ORDER — KCL IN DEXTROSE-NACL 20-5-0.45 MEQ/L-%-% IV SOLN
INTRAVENOUS | Status: DC
  Administered 2015-12-11: 14:00:00 via INTRAVENOUS
  Administered 2015-12-12: 1 mL via INTRAVENOUS
  Filled 2015-12-11: qty 2000

## 2015-12-11 MED ORDER — LIDOCAINE HCL (CARDIAC) 20 MG/ML IV SOLN
INTRAVENOUS | Status: DC | PRN
  Administered 2015-12-11: 100 mg via INTRAVENOUS

## 2015-12-11 MED ORDER — METOCLOPRAMIDE HCL 5 MG/ML IJ SOLN: 10.0000 mg | Freq: Once | INTRAMUSCULAR | Status: DC | PRN

## 2015-12-11 MED ORDER — LEVOTHYROXINE SODIUM 75 MCG PO TABS
175.0000 ug | ORAL_TABLET | ORAL | Status: DC
  Administered 2015-12-12: 175 ug via ORAL
  Filled 2015-12-11: qty 1

## 2015-12-11 MED ORDER — DEXTROSE 5 % IV SOLN
10.0000 mg | INTRAVENOUS | Status: DC | PRN
  Administered 2015-12-11: 25 ug/min via INTRAVENOUS

## 2015-12-11 MED ORDER — FENTANYL CITRATE (PF) 100 MCG/2ML IJ SOLN
INTRAMUSCULAR | Status: DC | PRN
  Administered 2015-12-11 (×4): 50 ug via INTRAVENOUS
  Administered 2015-12-11: 100 ug via INTRAVENOUS
  Administered 2015-12-11 (×2): 50 ug via INTRAVENOUS

## 2015-12-11 MED ORDER — MIDAZOLAM HCL 2 MG/2ML IJ SOLN
INTRAMUSCULAR | Status: AC
  Filled 2015-12-11: qty 2

## 2015-12-11 MED ORDER — LACTATED RINGERS IV SOLN: INTRAVENOUS | Status: DC

## 2015-12-11 MED ORDER — IRBESARTAN 75 MG PO TABS
37.5000 mg | ORAL_TABLET | Freq: Every day | ORAL | Status: DC
  Filled 2015-12-11: qty 1

## 2015-12-11 MED ORDER — DEXAMETHASONE SODIUM PHOSPHATE 10 MG/ML IJ SOLN
INTRAMUSCULAR | Status: DC | PRN
  Administered 2015-12-11: 8 mg via INTRAVENOUS

## 2015-12-11 MED ORDER — 0.9 % SODIUM CHLORIDE (POUR BTL) OPTIME
TOPICAL | Status: DC | PRN
  Administered 2015-12-11 (×2): 1000 mL

## 2015-12-11 MED ORDER — CIPROFLOXACIN IN D5W 400 MG/200ML IV SOLN: 400.0000 mg | INTRAVENOUS | Status: DC

## 2015-12-11 MED ORDER — METHOCARBAMOL 500 MG PO TABS
500.0000 mg | ORAL_TABLET | Freq: Four times a day (QID) | ORAL | Status: DC | PRN
  Administered 2015-12-11 – 2015-12-12 (×3): 500 mg via ORAL
  Filled 2015-12-11 (×3): qty 1

## 2015-12-11 MED ORDER — MEPERIDINE HCL 25 MG/ML IJ SOLN: 6.2500 mg | INTRAMUSCULAR | Status: DC | PRN

## 2015-12-11 MED ORDER — ONDANSETRON 4 MG PO TBDP: 4.0000 mg | ORAL_TABLET | Freq: Four times a day (QID) | ORAL | Status: DC | PRN

## 2015-12-11 MED ORDER — ROSUVASTATIN CALCIUM 10 MG PO TABS
10.0000 mg | ORAL_TABLET | Freq: Every evening | ORAL | Status: DC
Start: 2015-12-11 — End: 2015-12-12
  Administered 2015-12-11: 10 mg via ORAL
  Filled 2015-12-11: qty 1

## 2015-12-11 MED ORDER — MIDAZOLAM HCL 5 MG/5ML IJ SOLN
INTRAMUSCULAR | Status: DC | PRN
  Administered 2015-12-11: 2 mg via INTRAVENOUS

## 2015-12-11 MED ORDER — CIPROFLOXACIN IN D5W 400 MG/200ML IV SOLN
INTRAVENOUS | Status: AC
  Administered 2015-12-11: 400 mg via INTRAVENOUS
  Filled 2015-12-11: qty 200

## 2015-12-11 MED ORDER — POLYETHYL GLYCOL-PROPYL GLYCOL 0.4-0.3 % OP GEL: Freq: Every day | OPHTHALMIC | Status: DC

## 2015-12-11 MED ORDER — LORATADINE 10 MG PO TABS
10.0000 mg | ORAL_TABLET | Freq: Every day | ORAL | Status: DC
  Administered 2015-12-12: 10 mg via ORAL
  Filled 2015-12-11: qty 1

## 2015-12-11 MED ORDER — SUGAMMADEX SODIUM 200 MG/2ML IV SOLN
INTRAVENOUS | Status: AC
  Filled 2015-12-11: qty 2

## 2015-12-11 MED ORDER — PROPOFOL 10 MG/ML IV BOLUS
INTRAVENOUS | Status: DC | PRN
  Administered 2015-12-11: 180 mg via INTRAVENOUS

## 2015-12-11 MED ORDER — SUGAMMADEX SODIUM 200 MG/2ML IV SOLN
INTRAVENOUS | Status: DC | PRN
  Administered 2015-12-11: 200 mg via INTRAVENOUS

## 2015-12-11 MED ORDER — ACETAMINOPHEN 325 MG PO TABS: 650.0000 mg | ORAL_TABLET | Freq: Four times a day (QID) | ORAL | Status: DC | PRN

## 2015-12-11 MED ORDER — BUPIVACAINE-EPINEPHRINE (PF) 0.25% -1:200000 IJ SOLN
INTRAMUSCULAR | Status: DC | PRN
  Administered 2015-12-11: 60 mL

## 2015-12-11 MED ORDER — FENTANYL CITRATE (PF) 100 MCG/2ML IJ SOLN: 25.0000 ug | INTRAMUSCULAR | Status: DC | PRN

## 2015-12-11 MED ORDER — LACTATED RINGERS IV SOLN
INTRAVENOUS | Status: DC | PRN
  Administered 2015-12-11 (×2): via INTRAVENOUS

## 2015-12-11 MED ORDER — OXYCODONE HCL 5 MG PO TABS
5.0000 mg | ORAL_TABLET | ORAL | Status: DC | PRN
  Administered 2015-12-11 – 2015-12-12 (×5): 10 mg via ORAL
  Filled 2015-12-11 (×5): qty 2

## 2015-12-11 MED ORDER — ONDANSETRON HCL 4 MG/2ML IJ SOLN
INTRAMUSCULAR | Status: AC
  Filled 2015-12-11: qty 2

## 2015-12-11 MED ORDER — ACETAMINOPHEN 10 MG/ML IV SOLN
INTRAVENOUS | Status: DC | PRN
  Administered 2015-12-11: 1000 mg via INTRAVENOUS

## 2015-12-11 MED ORDER — PHENYLEPHRINE 40 MCG/ML (10ML) SYRINGE FOR IV PUSH (FOR BLOOD PRESSURE SUPPORT)
PREFILLED_SYRINGE | INTRAVENOUS | Status: AC
  Filled 2015-12-11: qty 10

## 2015-12-11 MED ORDER — METHYLENE BLUE 0.5 % INJ SOLN
INTRAVENOUS | Status: AC
  Filled 2015-12-11: qty 10

## 2015-12-11 MED ORDER — DIPHENHYDRAMINE HCL 12.5 MG/5ML PO ELIX: 12.5000 mg | ORAL_SOLUTION | Freq: Four times a day (QID) | ORAL | Status: DC | PRN

## 2015-12-11 MED ORDER — SIMETHICONE 80 MG PO CHEW: 40.0000 mg | CHEWABLE_TABLET | Freq: Four times a day (QID) | ORAL | Status: DC | PRN

## 2015-12-11 MED ORDER — DIPHENHYDRAMINE HCL 50 MG/ML IJ SOLN: 12.5000 mg | Freq: Four times a day (QID) | INTRAMUSCULAR | Status: DC | PRN

## 2015-12-11 MED ORDER — BUPIVACAINE-EPINEPHRINE (PF) 0.5% -1:200000 IJ SOLN
INTRAMUSCULAR | Status: AC
  Filled 2015-12-11: qty 60

## 2015-12-11 SURGICAL SUPPLY — 97 items
ATCH SMKEVC FLXB CAUT HNDSWH (FILTER) ×2 IMPLANT
BAG DECANTER FOR FLEXI CONT (MISCELLANEOUS) ×6 IMPLANT
BINDER BREAST LRG (GAUZE/BANDAGES/DRESSINGS) IMPLANT
BINDER BREAST XLRG (GAUZE/BANDAGES/DRESSINGS) ×3 IMPLANT
BNDG COHESIVE 4X5 TAN STRL (GAUZE/BANDAGES/DRESSINGS) IMPLANT
CANISTER SUCTION 2500CC (MISCELLANEOUS) ×3 IMPLANT
CHLORAPREP W/TINT 26ML (MISCELLANEOUS) ×6 IMPLANT
CLIP TI LARGE 6 (CLIP) IMPLANT
CLIP TI MEDIUM 24 (CLIP) ×3 IMPLANT
CLIP TI MEDIUM 6 (CLIP) IMPLANT
CLIP TI WIDE RED SMALL 6 (CLIP) IMPLANT
CONT SPEC 4OZ CLIKSEAL STRL BL (MISCELLANEOUS) ×3 IMPLANT
COVER SURGICAL LIGHT HANDLE (MISCELLANEOUS) ×3 IMPLANT
COVER TRANSDUCER ULTRASND GEL (DRAPE) ×3 IMPLANT
DRAIN CHANNEL 15F RND FF W/TCR (WOUND CARE) ×6 IMPLANT
DRAIN CHANNEL 19F RND (DRAIN) ×6 IMPLANT
DRAPE INCISE IOBAN 66X45 STRL (DRAPES) IMPLANT
DRAPE ORTHO SPLIT 77X108 STRL (DRAPES)
DRAPE PROXIMA HALF (DRAPES) ×6 IMPLANT
DRAPE SURG 17X23 STRL (DRAPES) ×3 IMPLANT
DRAPE SURG ORHT 6 SPLT 77X108 (DRAPES) IMPLANT
DRAPE UTILITY XL STRL (DRAPES) ×3 IMPLANT
DRAPE WARM FLUID 44X44 (DRAPE) ×3 IMPLANT
DRSG PAD ABDOMINAL 8X10 ST (GAUZE/BANDAGES/DRESSINGS) ×12 IMPLANT
DRSG TEGADERM 2-3/8X2-3/4 SM (GAUZE/BANDAGES/DRESSINGS) IMPLANT
DRSG TEGADERM 4X4.75 (GAUZE/BANDAGES/DRESSINGS) IMPLANT
ELECT BLADE 4.0 EZ CLEAN MEGAD (MISCELLANEOUS) ×3
ELECT CAUTERY BLADE 6.4 (BLADE) ×3 IMPLANT
ELECT COATED BLADE 2.86 ST (ELECTRODE) IMPLANT
ELECT REM PT RETURN 9FT ADLT (ELECTROSURGICAL) ×3
ELECTRODE BLDE 4.0 EZ CLN MEGD (MISCELLANEOUS) ×2 IMPLANT
ELECTRODE REM PT RTRN 9FT ADLT (ELECTROSURGICAL) ×2 IMPLANT
EVACUATOR SILICONE 100CC (DRAIN) ×12 IMPLANT
EVACUATOR SMOKE ACCUVAC VALLEY (FILTER) ×1
GAUZE SPONGE 4X4 12PLY STRL (GAUZE/BANDAGES/DRESSINGS) IMPLANT
GLOVE BIO SURGEON STRL SZ 6 (GLOVE) ×12 IMPLANT
GLOVE BIO SURGEON STRL SZ7 (GLOVE) ×6 IMPLANT
GLOVE BIOGEL PI IND STRL 6 (GLOVE) ×2 IMPLANT
GLOVE BIOGEL PI IND STRL 6.5 (GLOVE) ×6 IMPLANT
GLOVE BIOGEL PI IND STRL 7.0 (GLOVE) ×4 IMPLANT
GLOVE BIOGEL PI INDICATOR 6 (GLOVE) ×1
GLOVE BIOGEL PI INDICATOR 6.5 (GLOVE) ×3
GLOVE BIOGEL PI INDICATOR 7.0 (GLOVE) ×2
GLOVE ECLIPSE 6.0 STRL STRAW (GLOVE) ×6 IMPLANT
GOWN STRL REUS W/ TWL LRG LVL3 (GOWN DISPOSABLE) ×12 IMPLANT
GOWN STRL REUS W/TWL 2XL LVL3 (GOWN DISPOSABLE) ×3 IMPLANT
GOWN STRL REUS W/TWL LRG LVL3 (GOWN DISPOSABLE) ×6
ILLUMINATOR WAVEGUIDE N/F (MISCELLANEOUS) IMPLANT
KIT BASIN OR (CUSTOM PROCEDURE TRAY) ×3 IMPLANT
KIT ROOM TURNOVER OR (KITS) ×3 IMPLANT
LIGHT WAVEGUIDE WIDE FLAT (MISCELLANEOUS) ×3 IMPLANT
LIQUID BAND (GAUZE/BANDAGES/DRESSINGS) ×3 IMPLANT
MARKER SKIN DUAL TIP RULER LAB (MISCELLANEOUS) ×3 IMPLANT
NEEDLE 18GX1X1/2 (RX/OR ONLY) (NEEDLE) IMPLANT
NEEDLE FILTER BLUNT 18X 1/2SAF (NEEDLE)
NEEDLE FILTER BLUNT 18X1 1/2 (NEEDLE) IMPLANT
NEEDLE HYPO 25GX1X1/2 BEV (NEEDLE) IMPLANT
NEEDLE SPNL 22GX3.5 QUINCKE BK (NEEDLE) ×3 IMPLANT
NS IRRIG 1000ML POUR BTL (IV SOLUTION) ×6 IMPLANT
PACK GENERAL/GYN (CUSTOM PROCEDURE TRAY) ×3 IMPLANT
PACK UNIVERSAL I (CUSTOM PROCEDURE TRAY) ×3 IMPLANT
PAD ARMBOARD 7.5X6 YLW CONV (MISCELLANEOUS) ×3 IMPLANT
PENCIL BUTTON HOLSTER BLD 10FT (ELECTRODE) ×3 IMPLANT
PIN SAFETY STERILE (MISCELLANEOUS) ×6 IMPLANT
PREFILTER EVAC NS 1 1/3-3/8IN (MISCELLANEOUS) ×3 IMPLANT
SET ASEPTIC TRANSFER (MISCELLANEOUS) ×3 IMPLANT
SOLUTION BETADINE 4OZ (MISCELLANEOUS) ×6 IMPLANT
SPECIMEN JAR X LARGE (MISCELLANEOUS) ×6 IMPLANT
SPONGE GAUZE 4X4 12PLY STER LF (GAUZE/BANDAGES/DRESSINGS) IMPLANT
SPONGE LAP 18X18 X RAY DECT (DISPOSABLE) ×12 IMPLANT
STAPLER VISISTAT 35W (STAPLE) ×3 IMPLANT
STOCKINETTE IMPERVIOUS 9X36 MD (GAUZE/BANDAGES/DRESSINGS) IMPLANT
STRIP CLOSURE SKIN 1/2X4 (GAUZE/BANDAGES/DRESSINGS) ×3 IMPLANT
SUT ETHILON 2 0 FS 18 (SUTURE) ×6 IMPLANT
SUT MNCRL 3 0 RB1 (SUTURE) IMPLANT
SUT MNCRL AB 4-0 PS2 18 (SUTURE) ×6 IMPLANT
SUT MON AB 4-0 PC3 18 (SUTURE) IMPLANT
SUT MON AB 5-0 PS2 18 (SUTURE) IMPLANT
SUT MONOCRYL 3 0 RB1 (SUTURE)
SUT SILK 2 0 (SUTURE) ×1
SUT SILK 2 0 FS (SUTURE) ×6 IMPLANT
SUT SILK 2-0 18XBRD TIE 12 (SUTURE) ×2 IMPLANT
SUT VIC AB 3-0 SH 27 (SUTURE) ×4
SUT VIC AB 3-0 SH 27X BRD (SUTURE) ×8 IMPLANT
SUT VIC AB 3-0 SH 8-18 (SUTURE) ×3 IMPLANT
SUT VIC AB 4-0 PS2 27 (SUTURE) ×3 IMPLANT
SUT VICRYL 3 0 (SUTURE) IMPLANT
SUT VICRYL 4-0 PS2 18IN ABS (SUTURE) ×3 IMPLANT
SYR 50ML LL SCALE MARK (SYRINGE) ×3 IMPLANT
SYR BULB IRRIGATION 50ML (SYRINGE) ×3 IMPLANT
SYR CONTROL 10ML LL (SYRINGE) IMPLANT
TISSUE EXPANDER MV 400CC (Prosthesis & Implant Plastic) ×6 IMPLANT
TOWEL OR 17X24 6PK STRL BLUE (TOWEL DISPOSABLE) IMPLANT
TOWEL OR 17X26 10 PK STRL BLUE (TOWEL DISPOSABLE) ×3 IMPLANT
TRAY FOLEY CATH 16FRSI W/METER (SET/KITS/TRAYS/PACK) ×3 IMPLANT
TUBE CONNECTING 12X1/4 (SUCTIONS) ×9 IMPLANT
YANKAUER SUCT BULB TIP NO VENT (SUCTIONS) ×3 IMPLANT

## 2015-12-11 NOTE — Op Note (Signed)
Operative Note   DATE OF OPERATION: 5.11.17  LOCATION: [facility] OR- observation  SURGICAL DIVISION: Plastic Surgery  PREOPERATIVE DIAGNOSES:  1. Left breast cancer 2. History ovarian cancer 3. Family history breast cancer 4. Chronic neutropenia  POSTOPERATIVE DIAGNOSES:  same  PROCEDURE:  Bilateral breast reconstruction with placement tissue expanders  SURGEON: Irene Limbo MD MBA  ASSISTANT: none  ANESTHESIA:  General.   EBL: 100 ml for entire case  COMPLICATIONS: None immediate.   INDICATIONS FOR PROCEDURE:  The patient, [name], is a 60 y.o. female born on [birth year], is here for immediate breast reconstruction with tissue expanders. Patient has had multiple abdominal surgeries and is not a candidate for abdominal based autologous reconstruction.   FINDINGS: Natrelle 133-MV-T 400 ml tissue expanders placed, initial fill volume 200 ml bilateral. RIGHT SN [id number] LEFT SN [id number]  DESCRIPTION OF PROCEDURE:  The patient's operative sites were marked with the patient in the preoperative area. The patient was taken to the operating room. SCDs were placed and IV antibiotics were given. Following completion of mastectomies, reconstruction began on right side. The inferior insertions of pectoralis major muscle were elevated continuous with abdominal wall fascia to level of inframammary fold. Submuscular dissection completed toward clavicle. The serratus fascia and muscle were elevated laterally..The cavity was irrigated with polymyxin and bacitracin and inspected for hemostasis. 65 Fr JP placed in subcutaneous position and 15 Fr drain placed in submuscular position and secured with 2-0 nylon. Betadine irrigation used in pocket. The tissue expander was prepared and inserted into submuscular space and secured to chest wall with 3-0 vicryl. The serratus fascia and muscle was sewn to pectoralis muscle for complete muscle coverage. Skin closure was completed with running 3-0 vicryl in  superficial fascia and 4-0 vicryl in dermis. Skin closure completed with 4-0 monocryl subcuticular.  I then moved to left breast where pectoralis muscle elevated with abdominal wall fascia to inframammary fold. The serratus muscle and fascia elevated to anterior axillary line. The cavity was irrigated and drains placed, again 15 Fr in submuscular and 19 Fr in subcutaneous positions. Tissue expander placed and secured to chest wall with 3-0 vicryl. Skin closure completed in similar fashion. Tissue adhesive applied to all incisions. Ports accessed and filled to 200 ml bilateral. Dry dressing and breast binder applied.  The patient was allowed to wake from anesthesia, extubated and taken to the recovery room in satisfactory condition.   SPECIMENS: none  DRAINS: 19 Fr drain in right and left subcutaneous position, 15 Fr drain in right and left submuscular position  Irene Limbo, MD [facility] Plastic & Reconstructive Surgery [phone]

## 2015-12-11 NOTE — Interval H&P Note (Signed)
History and Physical Interval Note:  12/11/2015 7:08 AM  [name]  has presented today for surgery, with the diagnosis of LEFT BREAST CANCER  The various methods of treatment have been discussed with the patient and family. After consideration of risks, benefits and other options for treatment, the patient has consented to  Procedure(s): BILATERAL MASTECTOMY WITH LEFT SENTINEL LYMPH NODE BIOPSY (Bilateral) BILATERAL BREAST RECONSTRUCTION WITH PLACEMENT OF TISSUE EXPANDER AND POSSIBLE ACELLULAR DERMIS (Bilateral) as a surgical intervention .  The patient's history has been reviewed, patient examined, no change in status, stable for surgery.  I have reviewed the patient's chart and labs.  Questions were answered to the patient's satisfaction.     [name]

## 2015-12-11 NOTE — Care Management Note (Signed)
Case Management Note  Patient Details  Name: [name] MRN: [id number] Date of Birth: March 26, 1956  Subjective/Objective:                    Action/Plan:   Expected Discharge Date:                  Expected Discharge Plan:  Home/Self Care  In-House Referral:     Discharge planning Services     Post Acute Care Choice:    Choice offered to:     DME Arranged:    DME Agency:     HH Arranged:    [place] Agency:     Status of Service:  In process, will continue to follow  Medicare Important Message Given:    Date Medicare IM Given:    Medicare IM give by:    Date Additional Medicare IM Given:    Additional Medicare Important Message give by:     If discussed at [place] of Stay Meetings, dates discussed:    Additional Comments:  Marilu Favre, RN 12/11/2015, 1:45 PM

## 2015-12-11 NOTE — H&P (Signed)
[name]  Location: [facility] Patient #: [id number] DOB: January 20, 1956 Married / Language: English / Race: White Female   History of Present Illness  The patient is a 60 year old female who presents with breast cancer. Patient is a 60 year old female referred by Dr. Barney Drain for consultation regarding a new breast cancer. The patient presented with screening detected mass in the upper inner quadrant of the breast. This was 9 mm in greatest dimension. The mass was at 10:00 5 cm from the nipple. No abnormal lymph nodes were seen. The prognostic panel was not yet available. Of note, the patient has a history of endometrial cancer as well as ovarian cancer. She had a cul-de-sac recurrence last year and had a colostomy and stent placement. 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  She had menarche at age 37. Menopause was surgical in the late 40s. She is a G1 P1 with her first child in her early 92s. She is not on hormone therapy. Of note, she also has a mother that had stomach cancer.  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 Tissue sampling is recommended.   Allergies Cipro *FLUOROQUINOLONES* Penicillins Swelling. Sulfa Antibiotics Rash. Trimethoprim Erythromycin *DERMATOLOGICALS* Adhesive Tape 1"x5yd *MEDICAL DEVICES AND SUPPLIES* Rash.  Medication History  Valsartan (40MG Tablet, Oral) Active. Potassium Bicarbonate Active. Probiotic Pearls (Oral) Active. Benzonatate (100MG Capsule, Oral) Active. Crestor (10MG Tablet, Oral) Active. Hydrochlorothiazide (12.5MG Tablet, Oral) Active. Invokana (300MG Tablet, Oral) Active. Januvia (100MG Tablet, Oral) Active. MetFORMIN HCl (1000MG  Tablet, Oral) Active. Synthroid (175MCG Tablet, Oral) Active. Valsartan (320MG Tablet, Oral) Active. Lovaza (1GM Capsule, Oral) Active. Colace (100MG Capsule, Oral) Active. LevoFLOXacin (500MG Tablet, Oral) Active. Pearls IC (Oral) Active. Medications Reconciled LevoFLOXacin (750MG Tablet, Oral) Active. Neupogen (480MCG/1.6ML Solution, Injection) Active.    Review of Systems  All other systems negative  Vitals  Weight: 223.4 lb Height: 62.5in Body Surface Area: 2.02 m Body Mass Index: 40.21 kg/m  Temp.: 97.47F(Temporal)  Pulse: 71 (Regular)  BP: 130/68 (Sitting, Left Arm, Standard)     Physical Exam General Mental Status-Alert. General Appearance-Consistent with stated age. Hydration-Well hydrated. Voice-Normal.  Head and Neck Head-normocephalic, atraumatic with no lesions or palpable masses. Trachea-midline. Thyroid Gland Characteristics - normal size and consistency.  Eye Eyeball - Bilateral-Extraocular movements intact. Sclera/Conjunctiva - Bilateral-No scleral icterus.  Chest and Lung Exam Chest and lung exam reveals -quiet, even and easy respiratory effort with no use of accessory muscles and on auscultation, normal breath sounds, no adventitious sounds and normal vocal resonance. Inspection Chest Wall - Normal. Back - normal.  Breast Note: No palpable masses in either breast. There is some scattered fibrocystic breast tissue bilaterally. There is bruising on the left upper breast. No palpable adenopathy in either breast. No skin dimpling, nipple retraction, nipple discharge. Breasts are symmetric and ptotic bilaterally   Cardiovascular Cardiovascular examination reveals -normal heart sounds, regular rate and rhythm with no murmurs and normal pedal pulses bilaterally.  Abdomen Inspection Inspection of the abdomen reveals - No Hernias. Palpation/Percussion Palpation and Percussion of the abdomen reveal - Soft,  Non Tender, No Rebound tenderness, No Rigidity (guarding) and No hepatosplenomegaly. Auscultation Auscultation of the abdomen reveals - Bowel sounds normal. Note: colostomy LUQ.   Neurologic Neurologic evaluation reveals -alert and oriented x 3 with  no impairment of recent or remote memory. Mental Status-Normal.  Musculoskeletal Global Assessment -Note: no gross deformities.  Normal Exam - Left-Upper Extremity Strength Normal and Lower Extremity Strength Normal. Normal Exam - Right-Upper Extremity Strength Normal and Lower Extremity Strength Normal.  Lymphatic Head & Neck  General Head & Neck Lymphatics: Bilateral - Description - Normal. Axillary  General Axillary Region: Bilateral - Description - Normal. Tenderness - Non Tender. Femoral & Inguinal  Generalized Femoral & Inguinal Lymphatics: Bilateral - Description - No Generalized lymphadenopathy.    Assessment & Plan PRIMARY CANCER OF UPPER INNER QUADRANT OF LEFT FEMALE BREAST (C50.212) Impression: Patient has a new diagnosis of a clinical T1 N0 left breast cancer. Prognostic panel is pending.  The patient would like to pursue bilateral mastectomies with reconstruction. I think this is reasonable given her cancer history. I'm asked in her genetics as I am concerned about a possible Lynch syndrome or BRCA defect. I discussed that she had a cancer that was amenable to lumpectomy if she desired. She has a low likelihood of receiving radiation given the size of her tumor and normal appearance of her lymph nodes.  I also discussed that having a contralateral mastectomy does not make her risk of recurrent breast cancer 0. She may or may not be a candidate for chemotherapy if she has a high Oncotype or triple negative disease given her chronic neutropenia. She wants to do everything in her power to minimize her risk of recurrence given these facts.  She also is worried about symmetry if she has a mastectomy with  reconstruction on 1 side because of the ptosis of her opposite breast.  I have communicated with the genetic counselors and with Dr. Alvy Bimler, her oncologist. Burnis Medin discuss her at our multidisciplinary conference this Wednesday when receptor should be available. We will schedule her at the first available opportunity.  60 min spent in evaluation, examination, counseling, and coordination of care. >50% spent in counseling. Current Plans You are being scheduled for surgery - Our schedulers will call you.  You should hear from our office's scheduling department within 5 working days about the location, date, and time of surgery. We try to make accommodations for patient's preferences in scheduling surgery, but sometimes the OR schedule or the surgeon's schedule prevents [place] from making those accommodations.  If you have not heard from our office [phone]) in 5 working days, call the office and ask for your surgeon's nurse.  If you have other questions about your diagnosis, plan, or surgery, call the office and ask for your surgeon's nurse.  Pt Education - flb breast cancer surgery: discussed with patient and provided information. Referred to Surgery - Plastic, for evaluation and follow up (Plastic Surgery). Routine. Referred to Genetic Counseling, for evaluation and follow up [employer]). Urgent. Pt Education - CCS Mastectomy HCI

## 2015-12-11 NOTE — Anesthesia Postprocedure Evaluation (Signed)
Anesthesia Post Note  Patient: Financial planner  Procedure(s) Performed: Procedure(s) (LRB): RIGHT PROPHYLACTIC MASTECTOMY, LEFT TOTAL MASTECTOMY WITH LEFT AXILLARY SENTINEL LYMPH NODE BIOPSY (Bilateral) BILATERAL BREAST RECONSTRUCTION WITH PLACEMENT OF TISSUE EXPANDERS (Bilateral)  Patient location during evaluation: PACU Anesthesia Type: General and Regional Level of consciousness: awake, awake and alert and oriented Pain management: pain level controlled Vital Signs Assessment: post-procedure vital signs reviewed and stable Respiratory status: spontaneous breathing, nonlabored ventilation and respiratory function stable Cardiovascular status: blood pressure returned to baseline Anesthetic complications: no    Last Vitals:  Filed Vitals:   12/11/15 1300 12/11/15 1321  BP: 144/67 133/72  Pulse: 69 68  Temp: 36.9 C 36.8 C  Resp: 11 15    Last Pain:  Filed Vitals:   12/11/15 1358  PainSc: 3                  Keshia Weare COKER

## 2015-12-11 NOTE — Anesthesia Procedure Notes (Addendum)
Anesthesia Regional Block:  Pectoralis block  Pre-Anesthetic Checklist: ,, timeout performed, Correct Patient, Correct Site, Correct Laterality, Correct Procedure, Correct Position, site marked, Risks and benefits discussed,  Surgical consent,  Pre-op evaluation,  At surgeon's request and post-op pain management  Laterality: Left and Right  Prep: Maximum Sterile Barrier Precautions used and chloraprep       Needles:  Injection technique: Single-shot  Needle Type: Echogenic Stimulator Needle     Needle Length: 10cm 10 cm Needle Gauge: 21 and 21 G    Additional Needles:  Procedures: ultrasound guided (picture in chart) Pectoralis block Narrative:  Injection made incrementally with aspirations every 5 mL.  Performed by: Personally   Additional Notes: Risks, benefits and alternative to block explained extensively.  Bilateral blocks placed.  Patient tolerated procedure well, without complications.   Procedure Name: Intubation Date/Time: 12/11/2015 7:49 AM Performed by: Rejeana Brock L Pre-anesthesia Checklist: Patient identified, Timeout performed, Emergency Drugs available, Suction available and Patient being monitored Patient Re-evaluated:Patient Re-evaluated prior to inductionOxygen Delivery Method: Circle system utilized Preoxygenation: Pre-oxygenation with 100% oxygen Intubation Type: IV induction Ventilation: Mask ventilation without difficulty Laryngoscope Size: Mac and 3 Grade View: Grade II Tube type: Oral Tube size: 7.0 mm Number of attempts: 1 Airway Equipment and Method: Stylet Placement Confirmation: ETT inserted through vocal cords under direct vision,  positive ETCO2 and breath sounds checked- equal and bilateral Secured at: 20 cm Tube secured with: Tape Dental Injury: Teeth and Oropharynx as per pre-operative assessment

## 2015-12-11 NOTE — Op Note (Signed)
Bilateral Skin Sparing Mastectomy with Left Sentinel Node Biopsy   Indications: This patient presents with history of left breast cancer with clinically negative axillary lymph node exam, history of ovarian cancer.  Pre-operative Diagnosis: left breast cancer, upper inner quadrant, cT1N0M0   Post-operative Diagnosis:  same  Surgeon: Stark Klein MD  Assistant:  Irene Limbo, MD  Anesthesia: General endotracheal anesthesia and Local anesthesia 0.25.% bupivacaine, with epinephrine  ASA Class: 3  Procedure Details  The patient was seen in the Holding Room. The risks, benefits, complications, treatment options, and expected outcomes were discussed with the patient. The possibilities of reaction to medication, pulmonary aspiration, bleeding, infection, the need for additional procedures, failure to diagnose a condition, and creating a complication requiring transfusion or operation were discussed with the patient. The patient concurred with the proposed plan, giving informed consent.  The site of surgery properly noted/marked. The patient was taken to Operating Room # 2, identified as [facility] and the procedure verified as Bilateral Mastectomy and Left Sentinel Node Biopsy. A Time Out was held and the above information confirmed.      After induction of anesthesia, the bilateral breast and chest were prepped and draped in standard fashion.   The borders of the breast were identified and marked.  Elliptical incision lines were marked around the nipples.  The right side was addressed first.  The incision was made with the #10 blade.  Saline was infiltrated into the superior flap.  Mastectomy hooks were used to provide elevation of the skin edges, and the cautery was used to create the mastectomy flaps.  The dissection was taken to the fascia of the pectoralis major.  The penetrating vessels were clipped as needed.  The superior flap was taken medially to the lateral sternal border, superiorly to  the inferior border of the clavicle.  The inferior flap was similarly created, inferiorly to the inframammary fold and laterally to the border of the latissimus.  The breast was taken off including the pectoralis fascia and the axillary tail marked.  Hemostasis was achieved with cautery.    The left side was addressed similarly.  Following removal of the breast, the sentinel node portion was performed through the same incision.  Using a hand-held gamma probe, axillary sentinel nodes were identified.  Two level 2 axillary sentinel nodes were removed and submitted to pathology.  The findings are below.  The lymphovascular channels were clipped with metal clips.    The left side was inspected for hemostasis.      The patient was left with Dr. Iran Planas for reconstruction.      Findings: grossly clear surgical margins, cps on SLN #1 2300, cps SLN #2 320  Estimated Blood Loss:  less than 50 mL         Drains: per Dr. Iran Planas                Specimens: R breast, left breast, and two left axillary sentinel nodes         Complications:  None; patient tolerated the procedure well.         Disposition: PACU - hemodynamically stable.         Condition: stable

## 2015-12-11 NOTE — Transfer of Care (Signed)
Immediate Anesthesia Transfer of Care Note  Patient: [facility]  Procedure(s) Performed: Procedure(s): RIGHT PROPHYLACTIC MASTECTOMY, LEFT TOTAL MASTECTOMY WITH LEFT AXILLARY SENTINEL LYMPH NODE BIOPSY (Bilateral) BILATERAL BREAST RECONSTRUCTION WITH PLACEMENT OF TISSUE EXPANDERS (Bilateral)  Patient Location: PACU  Anesthesia Type:GA combined with regional for post-op pain  Level of Consciousness: awake  Airway & Oxygen Therapy: Patient Spontanous Breathing and Patient connected to nasal cannula oxygen  Post-op Assessment: Report given to RN, Post -op Vital signs reviewed and stable and Patient moving all extremities X 4  Post vital signs: Reviewed and stable  Last Vitals:  Filed Vitals:   12/11/15 0550  BP: 124/60  Pulse: 74  Temp: 36.7 C  Resp: 18    Last Pain: There were no vitals filed for this visit.    Patients Stated Pain Goal: 5 ([id number])  Complications: No apparent anesthesia complications

## 2015-12-11 NOTE — Anesthesia Preprocedure Evaluation (Signed)
Anesthesia Evaluation  Patient identified by MRN, date of birth, ID band Patient awake    Reviewed: Allergy & Precautions, NPO status , Patient's Chart, lab work & pertinent test results  History of Anesthesia Complications (+) PONV  Airway Mallampati: II  TM Distance: <3 FB Neck ROM: Full    Dental no notable dental hx.    Pulmonary neg pulmonary ROS,    Pulmonary exam normal breath sounds clear to auscultation       Cardiovascular hypertension, Normal cardiovascular exam Rhythm:Regular Rate:Normal     Neuro/Psych negative neurological ROS  negative psych ROS   GI/Hepatic Neg liver ROS, GERD  Medicated,  Endo/Other  diabetesHypothyroidism Morbid obesity  Renal/GU negative Renal ROS  negative genitourinary   Musculoskeletal negative musculoskeletal ROS (+)   Abdominal (+) + obese,   Peds negative pediatric ROS (+)  Hematology negative hematology ROS (+)   Anesthesia Other Findings   Reproductive/Obstetrics negative OB ROS                             Anesthesia Physical  Anesthesia Plan  ASA: III  Anesthesia Plan: General   Post-op Pain Management:    Induction: Intravenous  Airway Management Planned: Oral ETT  Additional Equipment:   Intra-op Plan:   Post-operative Plan: Extubation in OR  Informed Consent: I have reviewed the patients History and Physical, chart, labs and discussed the procedure including the risks, benefits and alternatives for the proposed anesthesia with the patient or authorized representative who has indicated his/her understanding and acceptance.   Dental advisory given  Plan Discussed with: CRNA and Surgeon  Anesthesia Plan Comments:         Anesthesia Quick Evaluation

## 2015-12-11 NOTE — Interval H&P Note (Signed)
History and Physical Interval Note:  12/11/2015 7:34 AM  [name]  has presented today for surgery, with the diagnosis of LEFT BREAST CANCER  The various methods of treatment have been discussed with the patient and family. After consideration of risks, benefits and other options for treatment, the patient has consented to  Procedure(s): BILATERAL MASTECTOMY WITH LEFT SENTINEL LYMPH NODE BIOPSY (Bilateral) BILATERAL BREAST RECONSTRUCTION WITH PLACEMENT OF TISSUE EXPANDER AND POSSIBLE ACELLULAR DERMIS (Bilateral) as a surgical intervention .  The patient's history has been reviewed, patient examined, no change in status, stable for surgery.  I have reviewed the patient's chart and labs.  Questions were answered to the patient's satisfaction.     Othmar Ringer

## 2015-12-12 ENCOUNTER — Encounter (HOSPITAL_COMMUNITY): Payer: Self-pay | Admitting: General Surgery

## 2015-12-12 LAB — GLUCOSE, CAPILLARY: Glucose-Capillary: 111 mg/dL — ABNORMAL HIGH (ref 65–99)

## 2015-12-12 LAB — CBC
HEMATOCRIT: 29.5 % — AB (ref 36.0–46.0)
Hemoglobin: 9.1 g/dL — ABNORMAL LOW (ref 12.0–15.0)
MCH: 26.8 pg (ref 26.0–34.0)
MCHC: 30.8 g/dL (ref 30.0–36.0)
MCV: 87 fL (ref 78.0–100.0)
PLATELETS: 204 10*3/uL (ref 150–400)
RBC: 3.39 MIL/uL — ABNORMAL LOW (ref 3.87–5.11)
RDW: 16.7 % — AB (ref 11.5–15.5)
WBC: 5.1 10*3/uL (ref 4.0–10.5)

## 2015-12-12 LAB — BASIC METABOLIC PANEL
ANION GAP: 11 (ref 5–15)
BUN: 11 mg/dL (ref 6–20)
CALCIUM: 8 mg/dL — AB (ref 8.9–10.3)
CO2: 28 mmol/L (ref 22–32)
Chloride: 102 mmol/L (ref 101–111)
Creatinine, Ser: 1.04 mg/dL — ABNORMAL HIGH (ref 0.44–1.00)
GFR calc Af Amer: >60 mL/min (ref >60)
GFR, EST NON AFRICAN AMERICAN: 57 mL/min — AB (ref >60)
GLUCOSE: 126 mg/dL — AB (ref 65–99)
Potassium: 3.4 mmol/L — ABNORMAL LOW (ref 3.5–5.1)
Sodium: 141 mmol/L (ref 135–145)

## 2015-12-12 MED ORDER — METHOCARBAMOL 500 MG PO TABS: 500.0000 mg | ORAL_TABLET | Freq: Three times a day (TID) | ORAL | Status: DC | PRN

## 2015-12-12 MED ORDER — CIPROFLOXACIN HCL 250 MG PO TABS: 250.0000 mg | ORAL_TABLET | Freq: Two times a day (BID) | ORAL | Status: DC

## 2015-12-12 MED ORDER — OXYCODONE HCL 5 MG PO TABS: 5.0000 mg | ORAL_TABLET | ORAL | Status: DC | PRN

## 2015-12-12 NOTE — Progress Notes (Signed)
POD#1 bilateral mastectomies, TE reconstruction  Ambulating, tolerating pain with oral meds  BP 116/51 mmHg  Pulse 63  Temp(Src) 98.2 F (36.8 C) (Oral)  Resp 16  Ht 5\' 3"  (1.6 m)  Wt 96 kg (211 lb 10.3 oz)  BMI 37.50 kg/m2  SpO2 100%  JP Right 185/75 Left 90/180  PE: Chest flat,excpeted ecchymoses Drains watery, serosanguinous   A/P Discussed with Dr. Henreitta Cea today. Reviewed drain and incisions care  Irene Limbo, MD [facility] Plastic & Reconstructive Surgery [phone]

## 2015-12-12 NOTE — Progress Notes (Signed)
1 Day Post-Op  Subjective: Pain controlled OK.  No n/v.    Objective: Vital signs in last 24 hours: Temp:  [97.3 F (36.3 C)-98.4 F (36.9 C)] 98.2 F (36.8 C) (05/12 0458) Pulse Rate:  [63-85] 63 (05/12 0458) Resp:  [11-16] 16 (05/12 0458) BP: (107-150)/(51-72) 116/51 mmHg (05/12 0458) SpO2:  [98 %-100 %] 100 % (05/12 0458) Weight:  [96 kg (211 lb 10.3 oz)] 96 kg (211 lb 10.3 oz) (05/11 1321) Last BM Date: 12/11/15  Intake/Output from previous day: 05/11 0701 - 05/12 0700 In: 4138.3 [P.O.:360; I.V.:3278.3; IV Piggyback:500] Out: [id number] [Urine:1200; Drains:530; Blood:100] Intake/Output this shift:    General appearance: alert, cooperative and mild distress Resp: breathing comfortably Chest wall: bilateral tenderness as expected.   No ischemia noted.  Lab Results:   Recent Labs  12/12/15 0631  WBC 5.1  HGB 9.1*  HCT 29.5*  PLT 204   BMET  Recent Labs  12/12/15 0631  NA 141  K 3.4*  CL 102  CO2 28  GLUCOSE 126*  BUN 11  CREATININE 1.04*  CALCIUM 8.0*   PT/INR No results for input(s): LABPROT, INR in the last 72 hours. ABG No results for input(s): PHART, HCO3 in the last 72 hours.  Invalid input(s): PCO2, PO2  Studies/Results: Nm Sentinel Node Inj-no Rpt (breast)  12/11/2015  CLINICAL DATA: left breast cancer Sulfur colloid was injected intradermally by the nuclear medicine technologist for breast cancer sentinel node localization.    Anti-infectives: Anti-infectives    Start     Dose/Rate Route Frequency Ordered Stop   12/11/15 2000  ciprofloxacin (CIPRO) IVPB 400 mg     400 mg 200 mL/hr over 60 Minutes Intravenous Every 12 hours 12/11/15 1312     12/11/15 0833  polymyxin B 500,000 Units, bacitracin 50,000 Units in sodium chloride irrigation 0.9 % 500 mL irrigation  Status:  Discontinued       As needed 12/11/15 0833 12/11/15 1208   12/11/15 0523  ciprofloxacin (CIPRO) 400 MG/200ML IVPB    Comments:  Ancil Boozer  : cabinet override   12/11/15 0523 12/11/15 0823   12/11/15 0515  ciprofloxacin (CIPRO) IVPB 400 mg  Status:  Discontinued     400 mg 200 mL/hr over 60 Minutes Intravenous On call to O.R. 12/11/15 0515 12/11/15 1312      Assessment/Plan: s/p Procedure(s): RIGHT PROPHYLACTIC MASTECTOMY, LEFT TOTAL MASTECTOMY WITH LEFT AXILLARY SENTINEL LYMPH NODE BIOPSY (Bilateral) BILATERAL BREAST RECONSTRUCTION WITH PLACEMENT OF TISSUE EXPANDERS (Bilateral)  Possible d/c later today. Pulmonary toilet. Drain care. Follow up with Dr. Iran Planas and myself.      LOS: 1 day    [facility] 12/12/2015

## 2015-12-12 NOTE — Discharge Instructions (Signed)
[facility] °[phone] ° °MASTECTOMY: POST OP INSTRUCTIONS ° °Always review your discharge instruction sheet given to you by the facility where your surgery was performed. °IF YOU HAVE DISABILITY OR FAMILY LEAVE FORMS, YOU MUST BRING THEM TO THE OFFICE FOR PROCESSING.   °DO NOT GIVE THEM TO YOUR DOCTOR. °A prescription for pain medication may be given to you upon discharge.  Take your pain medication as prescribed, if needed.  If narcotic pain medicine is not needed, then you may take acetaminophen (Tylenol) or ibuprofen (Advil) as needed. °1. Take your usually prescribed medications unless otherwise directed. °2. If you need a refill on your pain medication, please contact your pharmacy.  They will contact our office to request authorization.  Prescriptions will not be filled after 5pm or on week-ends. °3. You should follow a light diet the first few days after arrival home, such as soup and crackers, etc.  Resume your normal diet the day after surgery. °4. Most patients will experience some swelling and bruising on the chest and underarm.  Ice packs will help.  Swelling and bruising can take several days to resolve.  °5. It is common to experience some constipation if taking pain medication after surgery.  Increasing fluid intake and taking a stool softener (such as Colace) will usually help or prevent this problem from occurring.  A mild laxative (Milk of Magnesia or Miralax) should be taken according to package instructions if there are no bowel movements after 48 hours. °6. Unless discharge instructions indicate otherwise, leave your bandage dry and in place until your next appointment in 3-5 days.  You may take a limited sponge bath.  No tube baths or showers until the drains are removed.  You may have steri-strips (small skin tapes) in place directly over the incision.  These strips should be left on the skin for 7-10 days.  If your surgeon used skin glue on the incision, you may  shower in 24 hours.  The glue will flake off over the next 2-3 weeks.  Any sutures or staples will be removed at the office during your follow-up visit. °7. DRAINS:  If you have drains in place, it is important to keep a list of the amount of drainage produced each day in your drains.  Before leaving the hospital, you should be instructed on drain care.  Call our office if you have any questions about your drains. °8. ACTIVITIES:  You may resume regular (light) daily activities beginning the next day--such as daily self-care, walking, climbing stairs--gradually increasing activities as tolerated.  You may have sexual intercourse when it is comfortable.  Refrain from any heavy lifting or straining until approved by your doctor. °a. You may drive when you are no longer taking prescription pain medication, you can comfortably wear a seatbelt, and you can safely maneuver your car and apply brakes. °b. RETURN TO WORK:  __________________________________________________________ °9. You should see your doctor in the office for a follow-up appointment approximately 3-5 days after your surgery.  Your doctor’s nurse will typically make your follow-up appointment when she calls you with your pathology report.  Expect your pathology report 2-3 business days after your surgery.  You may call to check if you do not hear from us after three days.   °10. OTHER INSTRUCTIONS: ______________________________________________________________________________________________ ____________________________________________________________________________________________ °WHEN TO CALL YOUR DOCTOR: °1. Fever over 101.0 °2. Nausea and/or vomiting °3. Extreme swelling or bruising °4. Continued bleeding from incision. °5. Increased pain, redness, or drainage from the incision. °  The clinic staff is available to answer your questions during regular business hours.  Please don’t hesitate to call and ask to speak to one of the nurses for clinical  concerns.  If you have a medical emergency, go to the nearest emergency room or call 911.  A surgeon from [facility] is always on call at the hospital. °[address], [address], [place], [place]  [address] ? [address] [address], , [place]   [address] °[phone] ? [phone] ? FAX [phone] °Web site: [email or web address] °

## 2015-12-12 NOTE — Discharge Summary (Signed)
Physician Discharge Summary  Patient ID: [name] MRN: [id number] DOB/AGE: 01/20/1956 60 y.o.  Admit date: 12/11/2015 Discharge date: 12/12/2015  Admission Diagnoses: Left breast cancer  Discharge Diagnoses:  Active Problems:   Breast cancer, left [facility])   Discharged Condition: stable  Hospital Course: Patient did well post operatively tolerating diet, oral pain medication and ambulating without assist by POD#1.  Treatments: surgery: bilateral mastectomies left sentinel node and tissue expander reconstruction 5.11.17  Discharge Exam: Blood pressure 116/51, pulse 63, temperature 98.2 F (36.8 C), temperature source Oral, resp. rate 16, height 5\' 3"  (1.6 m), weight 96 kg (211 lb 10.3 oz), SpO2 100 %. Incision/Wound: Incisions intact, chest soft flat, drains serosanguinous watery  Disposition: 01-Home or Self Care  Discharge Instructions    Call MD for:  redness, tenderness, or signs of infection (pain, swelling, bleeding, redness, odor or green/yellow discharge around incision site)    Complete by:  As directed      Call MD for:  temperature >100.5    Complete by:  As directed      Discharge instructions    Complete by:  As directed   Ok to remove binder and shower am 5.13.17. Soap and water ok . Pat incisions dry. Breast binder or soft compression bra all other times. Strip and record drains twice daily and bring log to visit. No creams or ointments over incisions.  No exercise house or yard work. Do not let drains dangle in shower, attach to lanyard or similar. Ok to raise arms above shoulder for bathing and dressing.     Driving Restrictions    Complete by:  As directed   No driving while taking narcotics     Lifting restrictions    Complete by:  As directed   No lifting greater than 5 lbs     Resume previous diet    Complete by:  As directed             Medication List    TAKE these medications        Biotin 5 MG Tabs  Take 5 mg by mouth every morning.      ciprofloxacin 250 MG tablet  Commonly known as:  CIPRO  Take 1 tablet (250 mg total) by mouth 2 (two) times daily.     diphenhydrAMINE 25 MG tablet  Commonly known as:  BENADRYL  Take 25 mg by mouth every 4 (four) hours as needed for itching, allergies or sleep. Reported on 09/15/2015     filgrastim 480 MCG/1.6ML injection  Commonly known as:  NEUPOGEN  Inject 1.6 ml under the skin every 3 days for life     hyaluronate sodium Gel  Apply 1 application topically 2 (two) times daily as needed (scars). Reported on 09/15/2015     INVOKANA 300 MG Tabs tablet  Generic drug:  canagliflozin  Take 300 mg by mouth every morning.     loratadine 10 MG tablet  Commonly known as:  CLARITIN  Take 10 mg by mouth daily.     metFORMIN 1000 MG tablet  Commonly known as:  GLUCOPHAGE  Take 1,000 mg by mouth 2 (two) times daily with a meal.     methocarbamol 500 MG tablet  Commonly known as:  ROBAXIN  Take 1 tablet (500 mg total) by mouth every 8 (eight) hours as needed for muscle spasms.     multivitamin with minerals Tabs tablet  Take 1 tablet by mouth daily.     omega-3 acid ethyl esters  1 g capsule  Commonly known as:  LOVAZA  Take 1 g by mouth 2 (two) times daily.     omeprazole 20 MG capsule  Commonly known as:  PRILOSEC  Take 20 mg by mouth at bedtime.     oxyCODONE 5 MG immediate release tablet  Commonly known as:  Oxy IR/ROXICODONE  Take 1-2 tablets (5-10 mg total) by mouth every 4 (four) hours as needed for moderate pain.     rosuvastatin 10 MG tablet  Commonly known as:  CRESTOR  Take 10 mg by mouth every evening.     sitaGLIPtin 100 MG tablet  Commonly known as:  JANUVIA  Take 100 mg by mouth daily.     SYNTHROID 175 MCG tablet  Generic drug:  levothyroxine  Take 87.5-175 mcg by mouth daily before breakfast. 1108mcg daily except 87.9mcg on Sundays.     SYSTANE OP  Place 1 drop into both eyes at bedtime.     valsartan 80 MG tablet  Commonly known as:  DIOVAN  Take  80 mg by mouth daily.     Vitamin D3 10000 units capsule  Take 10,000 Units by mouth once a week. Sundays           Follow-up Information    Follow up with [facility], Arnoldo Hooker, MD In 1 week.   Specialty:  Plastic Surgery   Why:  as scheduled   Contact information:   [place] [address] [phone]       Follow up with [facility], MD. Schedule an appointment as soon as possible for a visit in 2 weeks.   Specialty:  General Surgery   Contact information:   [address] [place] [address] [phone]       Signed: Irene Limbo 12/12/2015, 9:23 AM

## 2015-12-15 ENCOUNTER — Ambulatory Visit: Payer: BLUE CROSS/BLUE SHIELD | Admitting: Gynecologic Oncology

## 2015-12-17 NOTE — Progress Notes (Signed)
Quick Note:  Please let patient know margins and LN negative. ______

## 2015-12-18 ENCOUNTER — Telehealth: Payer: Self-pay | Admitting: *Deleted

## 2015-12-18 NOTE — Telephone Encounter (Signed)
Pt left VM states Dr. Iran Planas, Plastic Surgeon,  Recommends Dr. Alvy Bimler order Oncotype on pt's Breast cancer tissue.  Pt requests call back to let her know what Dr. Alvy Bimler thinks.

## 2015-12-19 ENCOUNTER — Telehealth: Payer: Self-pay | Admitting: *Deleted

## 2015-12-19 ENCOUNTER — Encounter: Payer: Self-pay | Admitting: *Deleted

## 2015-12-19 NOTE — Telephone Encounter (Signed)
Spoke with patient. Dr Alvy Bimler will be discussing breast cancer when she sees her on 6/1. Dr Alvy Bimler has been in touch with [facility] pt's case. Pt reassured that she has not "fallen through the cracks"

## 2015-12-19 NOTE — Telephone Encounter (Signed)
I will forward this to the breast cancer group

## 2015-12-21 IMAGING — CR DG ABDOMEN ACUTE W/ 1V CHEST
4 series · 4 of 4 positions shown · non-contrast
Comparison: CT abdomen pelvis dated 12/02/2014. Chest radiographs
dated 11/20/2014.

CLINICAL DATA: Fever/ chills x3 days, nausea

EXAM:
DG ABDOMEN ACUTE W/ 1V CHEST

[w chest pa]
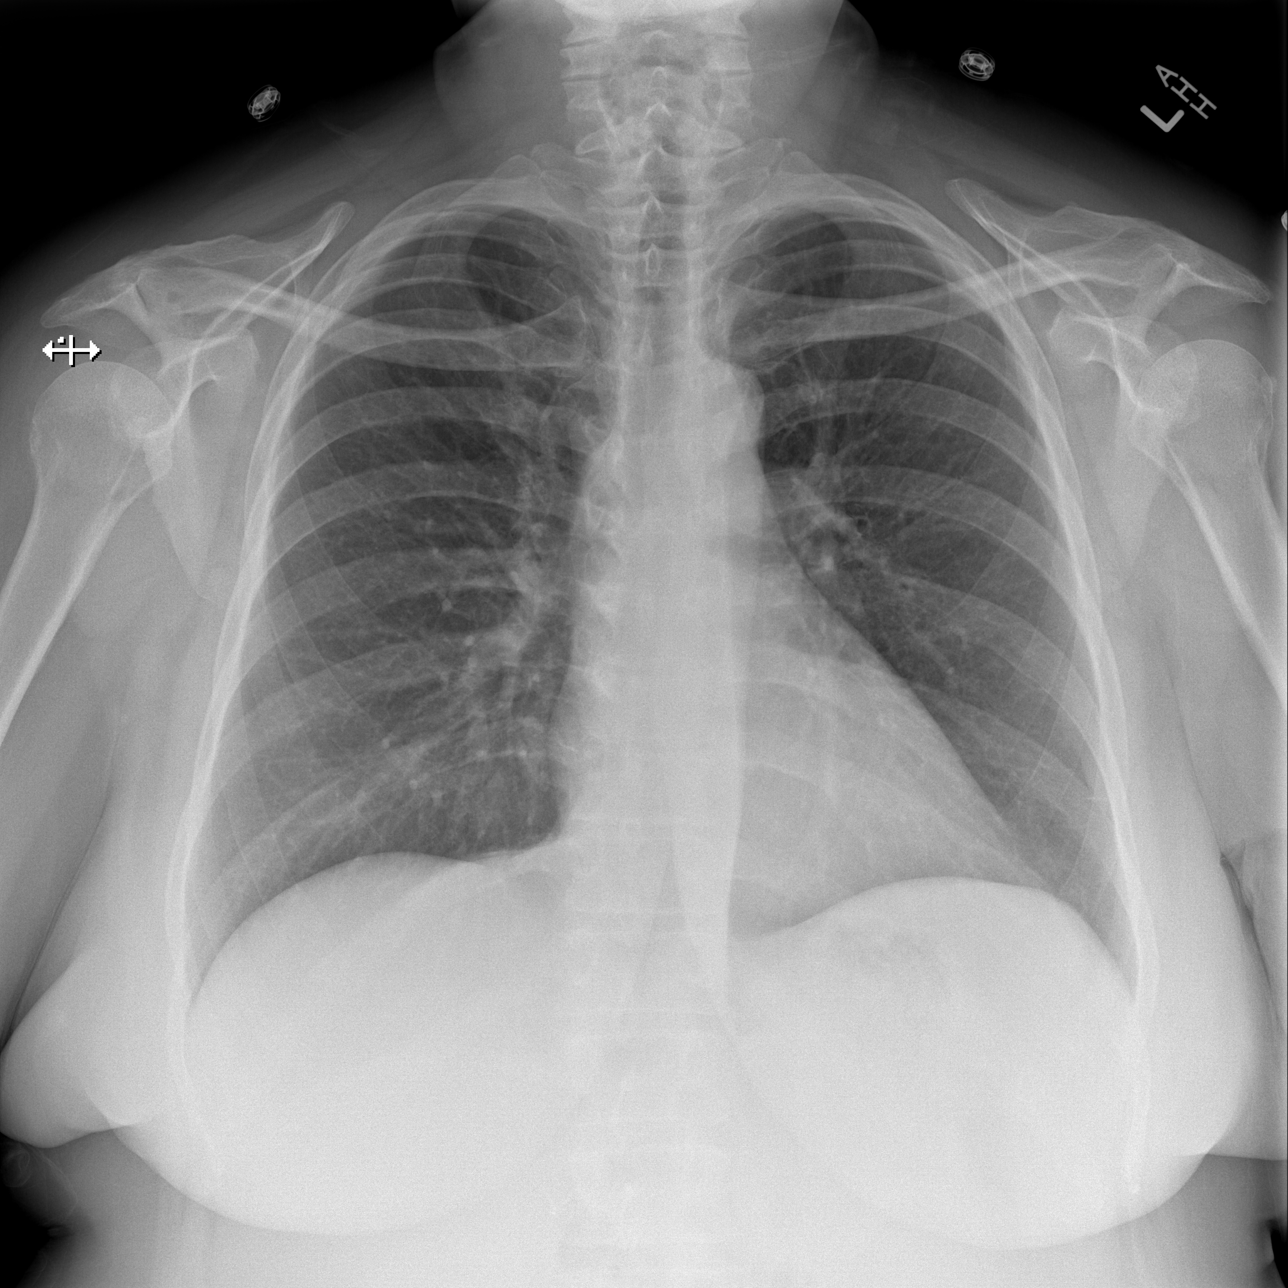

[w abdomen upright]
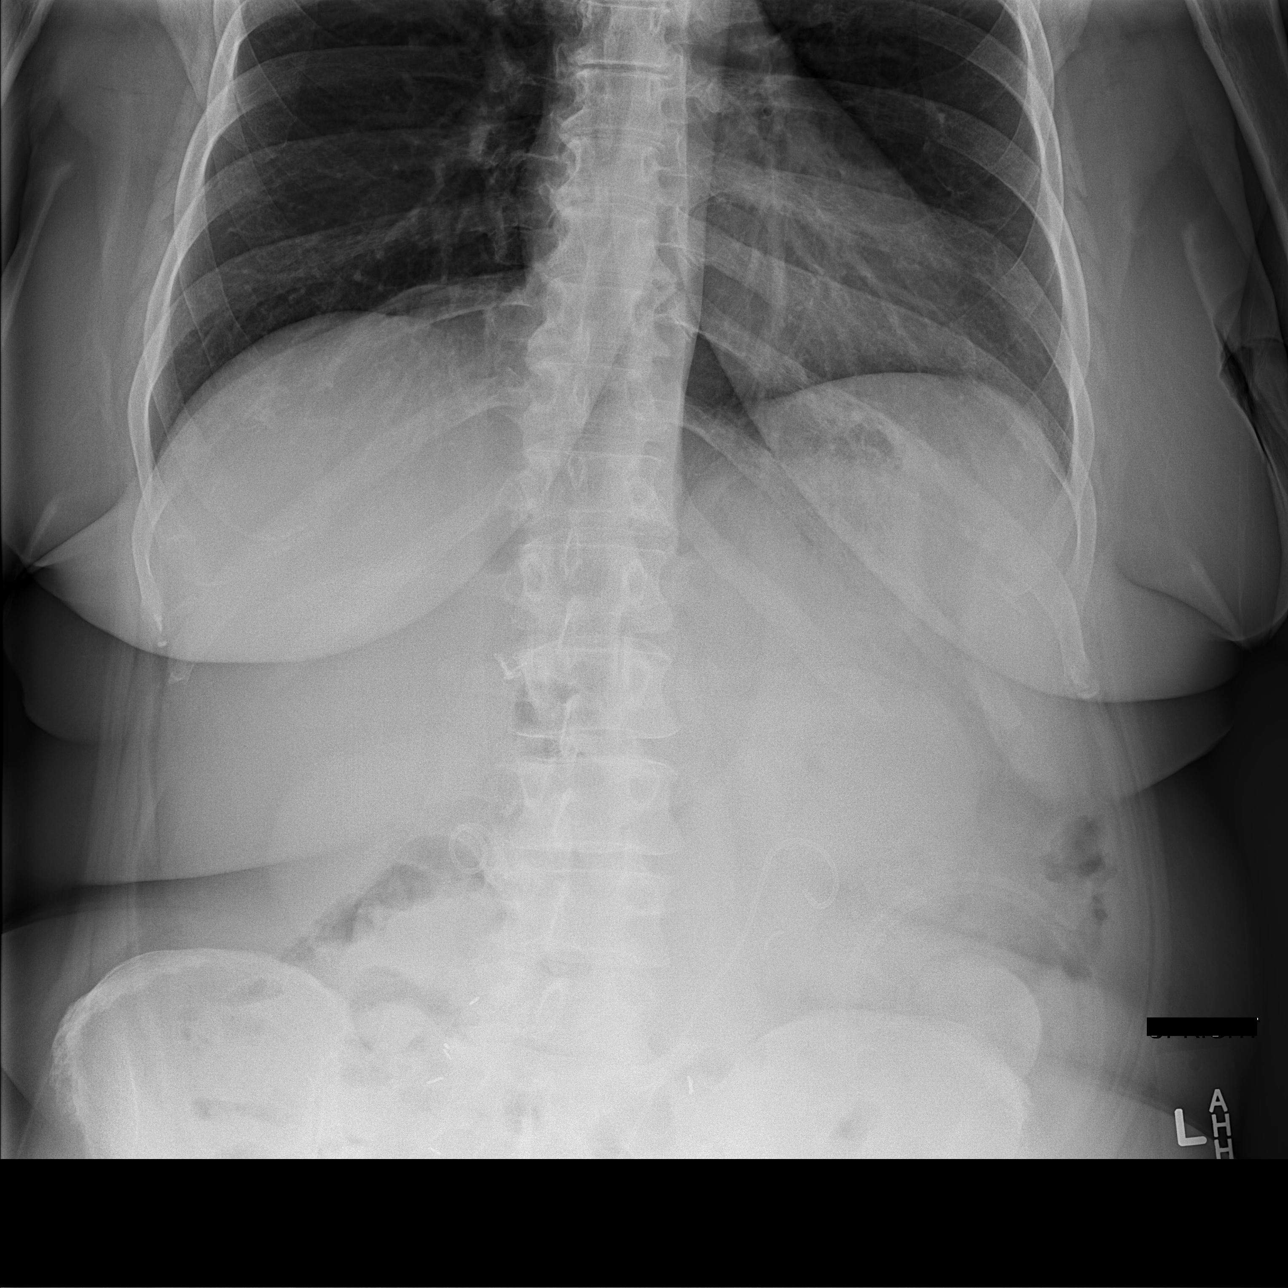

[t abdomen supine (1 of 2)]
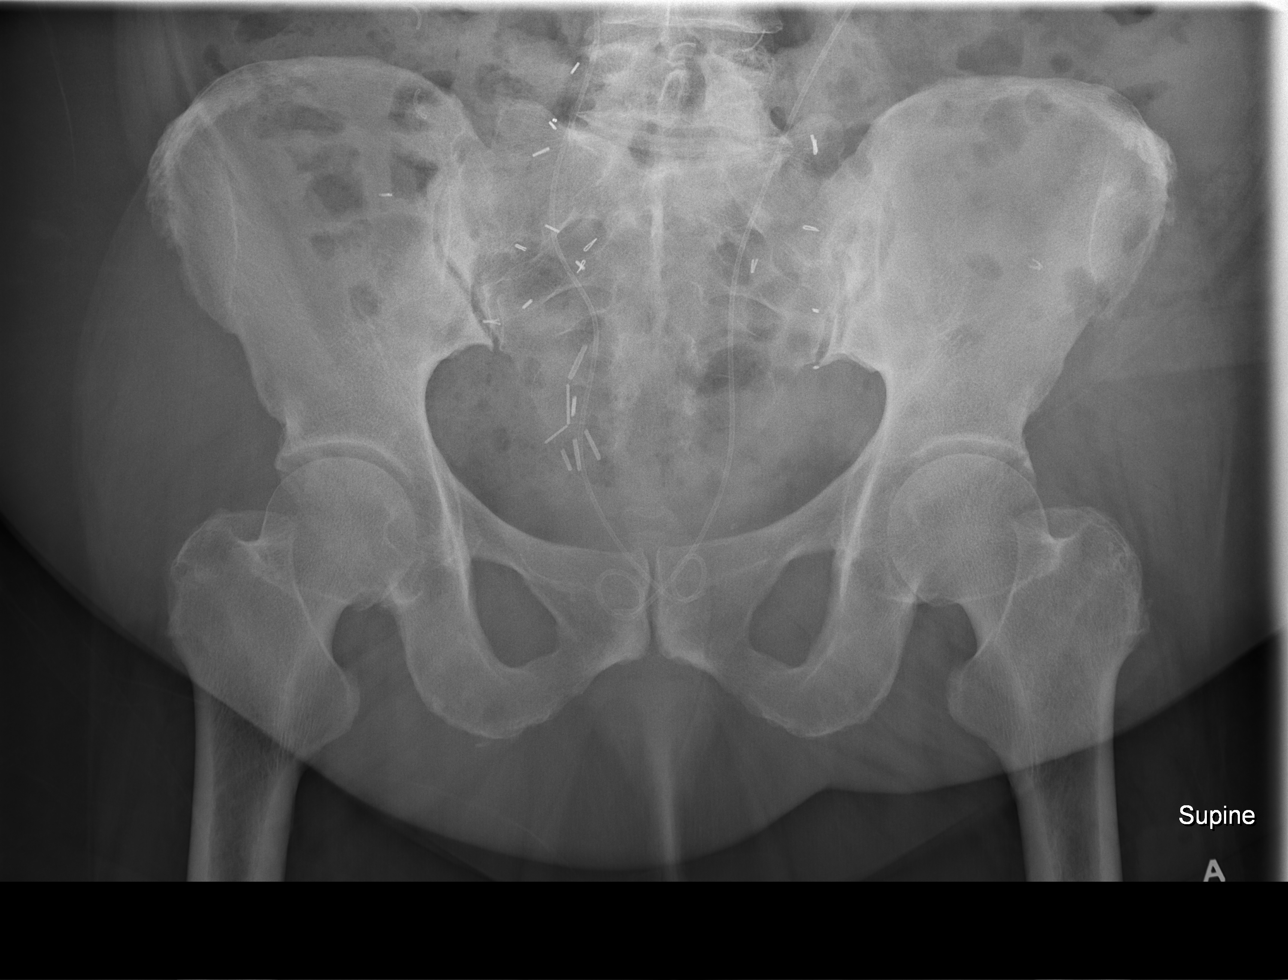

[t abdomen supine (2 of 2)]
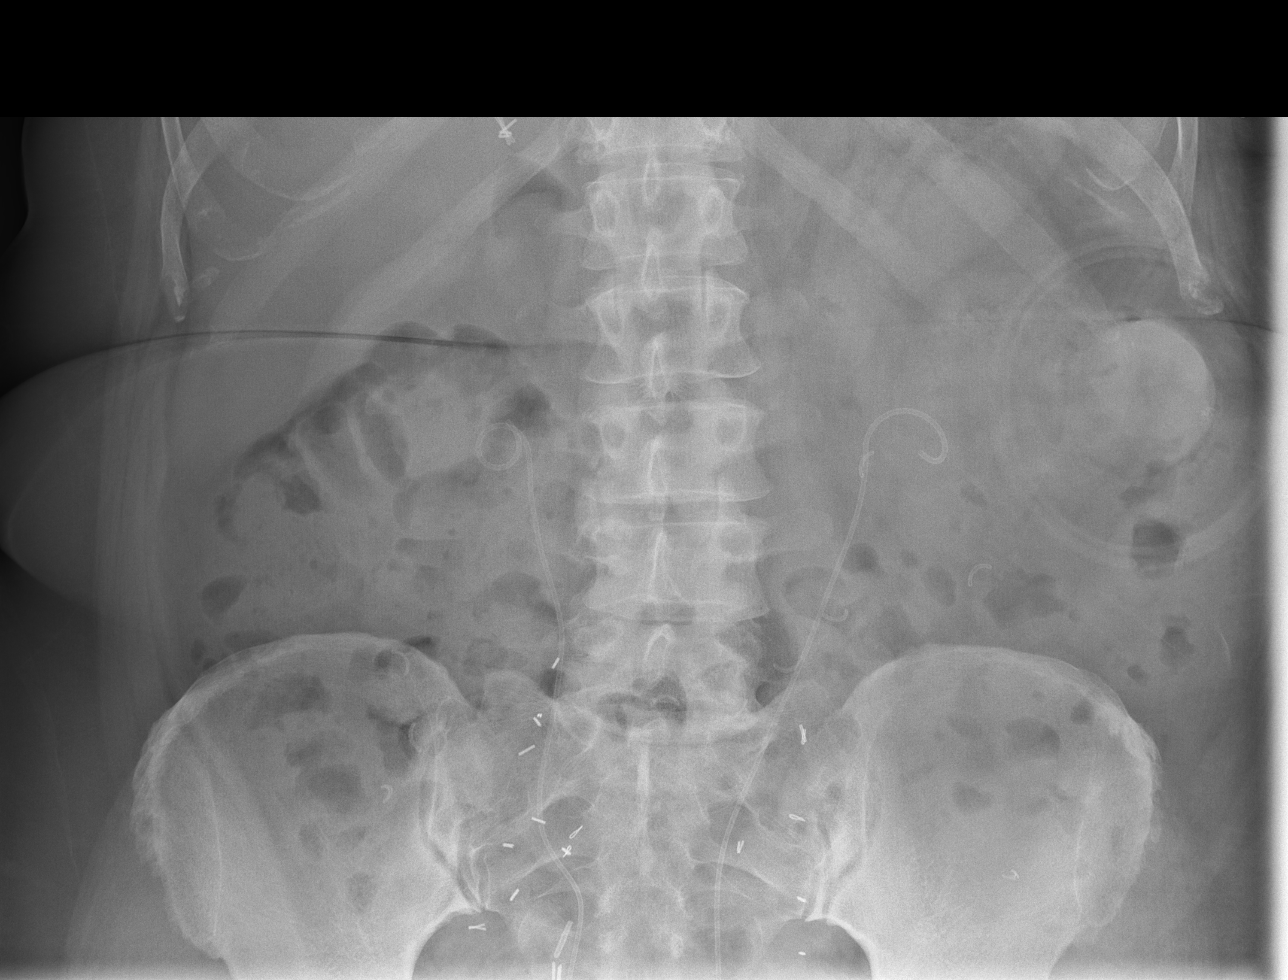

[4 of 4 positions shown; findings below may reference images not displayed]

FINDINGS: Lungs are clear.  No pleural effusion or pneumothorax.

The heart is normal in size.

Nonobstructive bowel gas pattern. Moderate colonic stool burden.
Left mid abdominal colostomy.

No evidence of free air under the diaphragm on the upright view.

Bilateral ureteral stents.

Cholecystectomy clips. Multiple surgical clips overlying the lower
abdomen/pelvis.
IMPRESSION: No evidence of acute cardiopulmonary disease.

No evidence of small bowel obstruction or free air.

Moderate colonic stool burden.

Surgical changes, as above.

## 2015-12-21 IMAGING — CT CT ABD-PELV W/ CM
2 of 6 series · 15 of 46 positions shown, 17 images · IV contrast (OMNIPAQUE 300)
Comparison: 12/02/2014

CLINICAL DATA: Not feeling well for 2 days with fever and chills
beginning today. Several procedures recently. Chronic intracranial
with endometrial, ovarian and uterine carcinoma. Colostomy revision
last week with ureteral stents placed 12/05/2014.

EXAM:
CT ABDOMEN AND PELVIS WITH CONTRAST
TECHNIQUE: Multidetector CT imaging of the abdomen and pelvis was performed
using the standard protocol following bolus administration of
intravenous contrast.
CONTRAST:  100mL OMNIPAQUE IOHEXOL 300 MG/ML  SOLN

[Series 2: abd/pel with · axial · 0.79mm/px · z∈[-464,-70]mm · 12 of 91 slices shown, 14 images]
[im 6/91  soft-tissue]
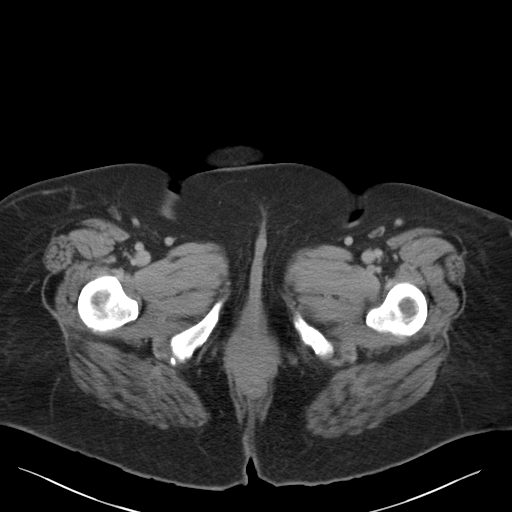
[im 6/91  bone]
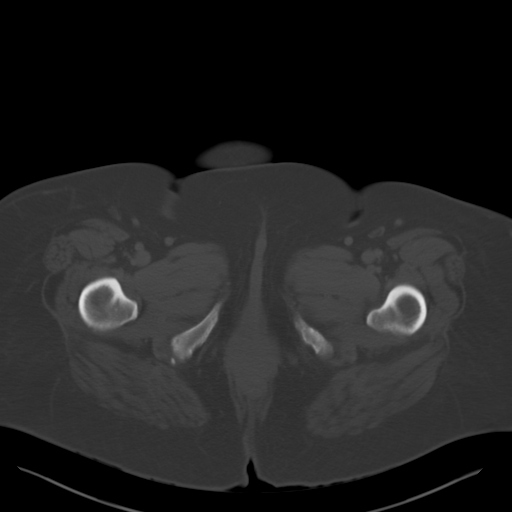
[im 12/91  soft-tissue]
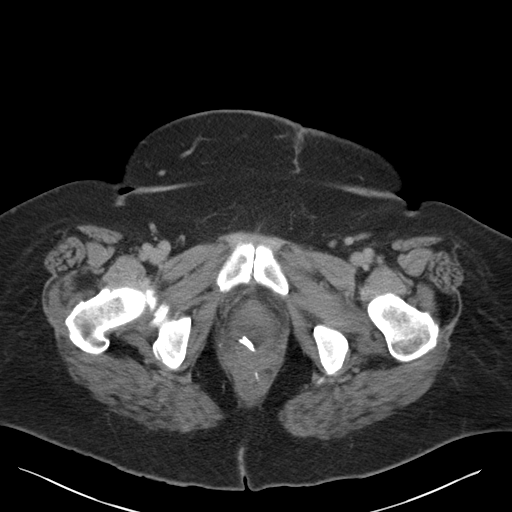
[im 23/91  soft-tissue]
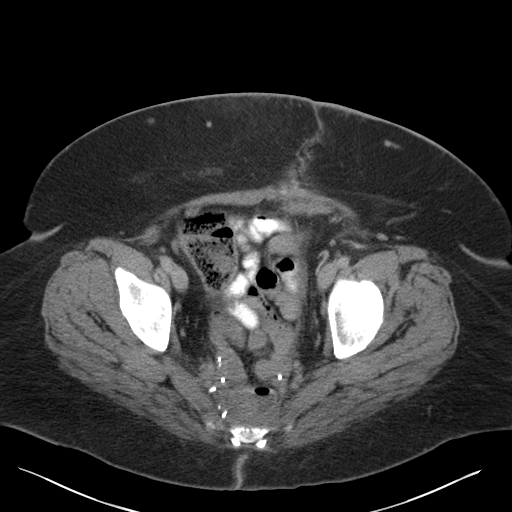
[im 29/91  soft-tissue]
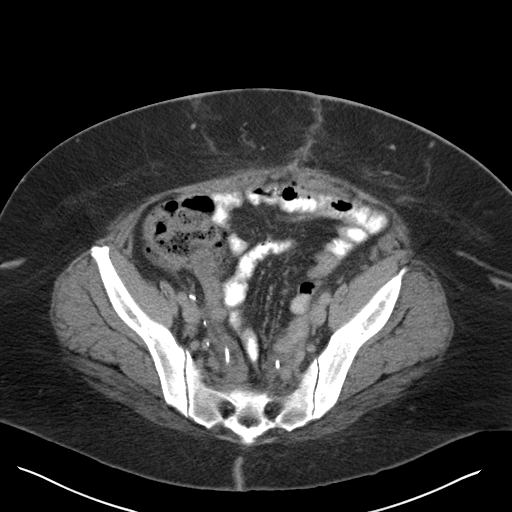
[im 34/91  soft-tissue]
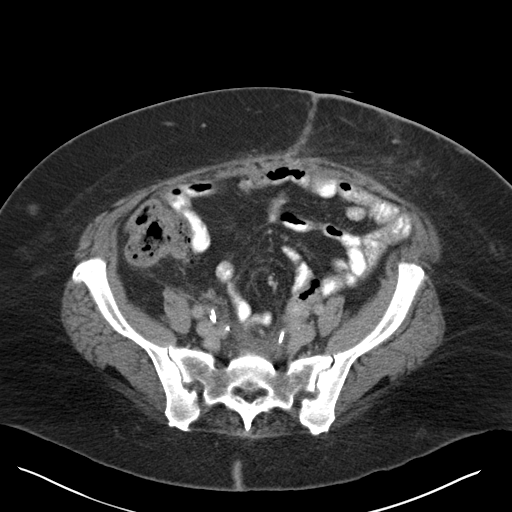
[im 40/91  soft-tissue]
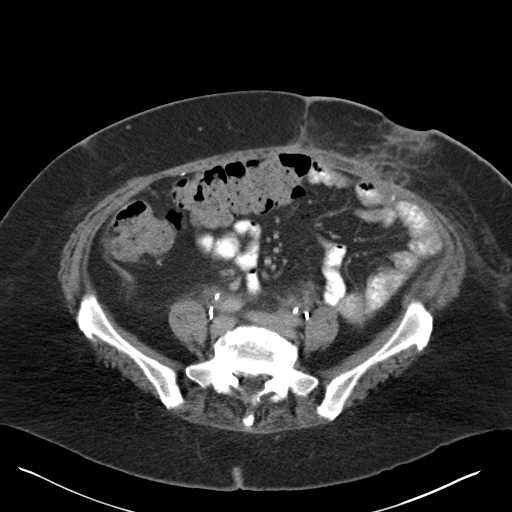
[im 51/91  soft-tissue]
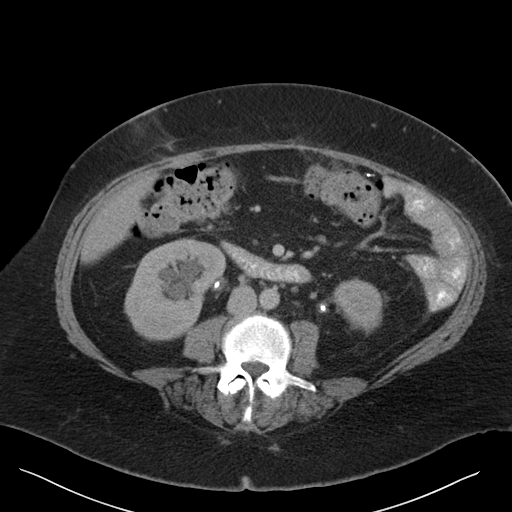
[im 57/91  soft-tissue]
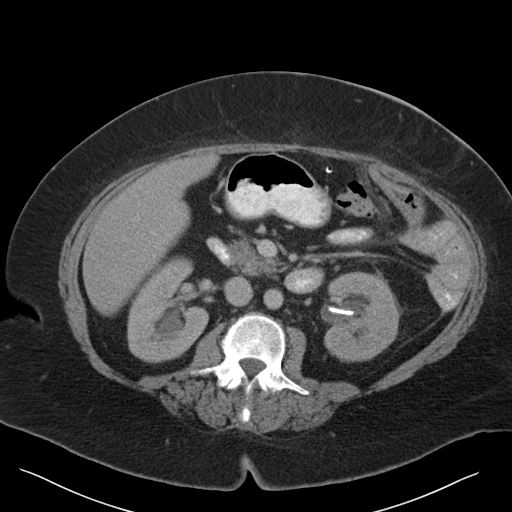
[im 62/91  soft-tissue]
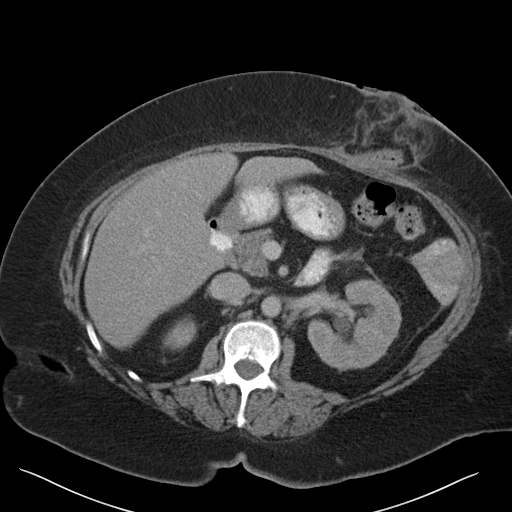
[im 62/91  bone]
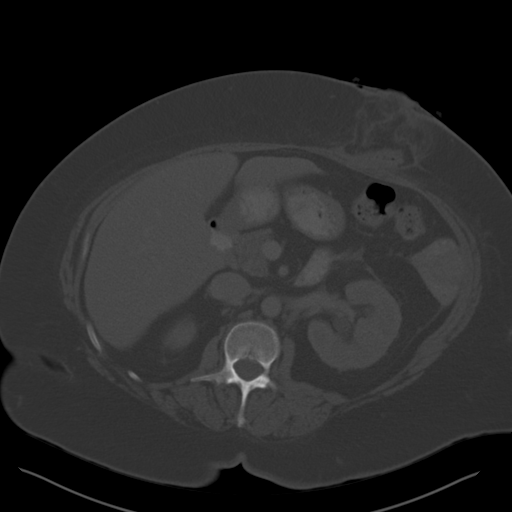
[im 68/91  soft-tissue]
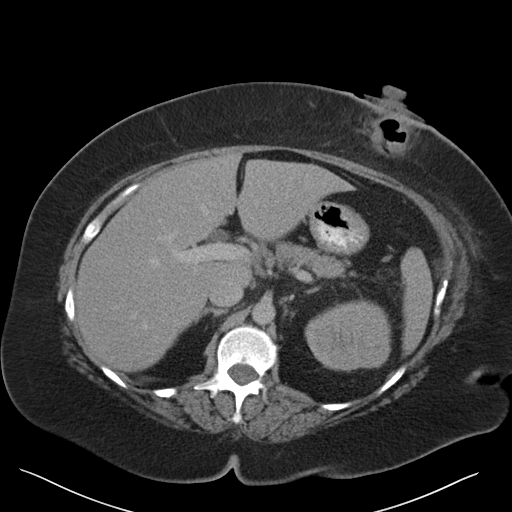
[im 79/91  soft-tissue]
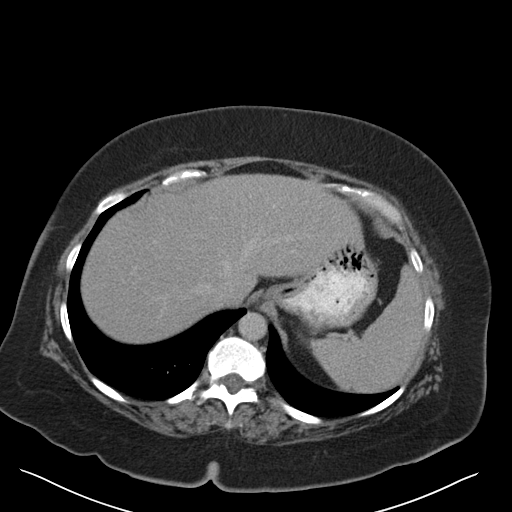
[im 85/91  soft-tissue]
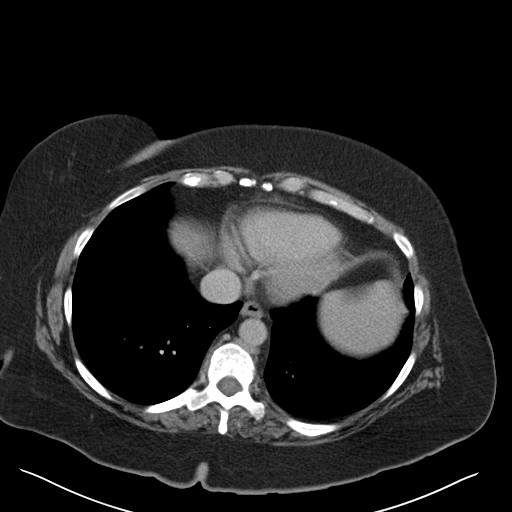

[Series 5: coronal a/|p · coronal · 0.74mm/px · 3 of 107 slices shown]
[im 36/107  soft-tissue]
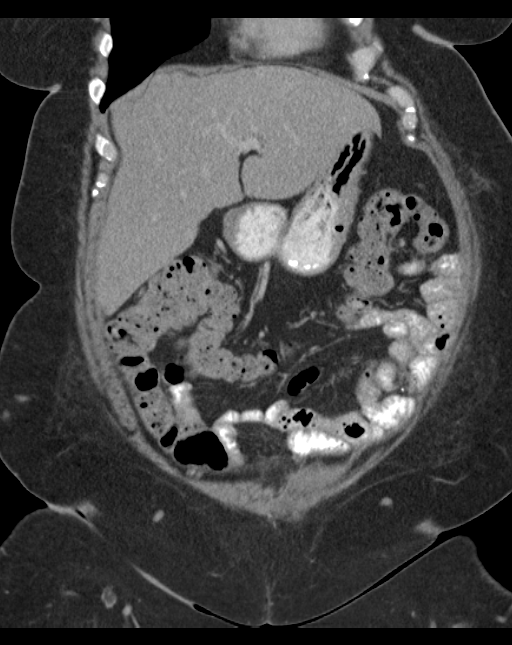
[im 48/107  soft-tissue]
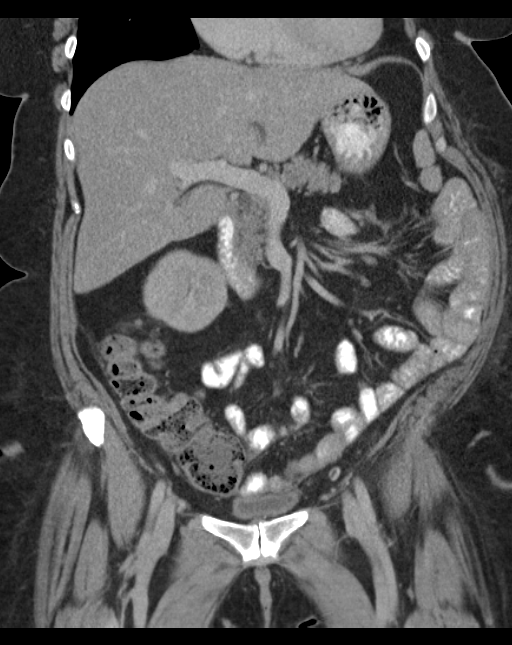
[im 59/107  soft-tissue]
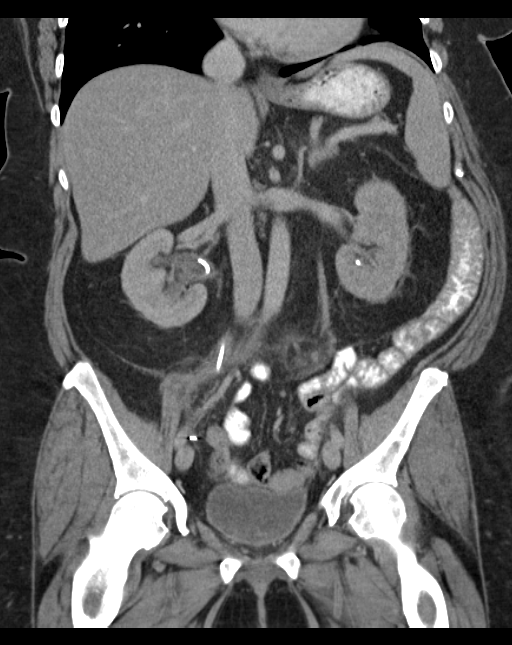

[15 of 46 positions shown; findings below may reference images not displayed]

FINDINGS: Lung bases are within normal.

Abdominal images demonstrate evidence of a previous cholecystectomy.
The liver, spleen, pancreas and adrenal glands are normal.

Kidneys are normal size and demonstrate mild bilateral
hydronephrosis with the presence of bilateral internal ureteral
stents which are in adequate position and unchanged. Delayed images
demonstrate contrast in the right intrarenal collecting system but
only a scant amount of contrast in the left intrarenal collecting
system. Further delayed images demonstrate no contrast in the
bladder.

Evidence of patient's colostomy site over the left mid abdomen
unchanged. Persistent minimal stranding of the fat adjacent to the
ostomy as cannot exclude mild soft tissue infection. No evidence of
abscess. Interval resolution of the previous noted subcutaneous air
over the abdominal wall. New area of stranding of the subcutaneous
fat over the left anterior abdominal wall below the ostomy site
which may be due to edema versus soft tissue infection.

Pelvic images demonstrate surgical suture line over the anorectal
junction with stable postsurgical changes over the posterior pelvis.
A small bowel loop courses into the region of the surgical bed.
Bladder is unremarkable. Remainder the exam is unchanged.
IMPRESSION: Stable postsurgical changes over the posterior pelvis likely related
to recent excision of recurrent endometrial carcinoma.

Colostomy site over the left mid abdomen with persistent minimal
stranding of the fat adjacent to the ostomy as cannot exclude soft
tissue infection. No abscess. Second site of stranding of the
subcutaneous fat over the left anterior abdominal wall below the
ostomy site which again may represent mild edema versus soft tissue
infection.

Bilateral double-J internal ureteral stents in adequate position
with persistent mild bilateral hydronephrosis.

## 2015-12-23 ENCOUNTER — Encounter (HOSPITAL_BASED_OUTPATIENT_CLINIC_OR_DEPARTMENT_OTHER): Payer: Self-pay | Admitting: *Deleted

## 2015-12-23 NOTE — H&P (Signed)
  Subjective:    Patient ID: [name] is a 60 y.o. female.  HPI  Nearly 2 weeks post op. Drains R 54/34/47/21 L 51/26/25/19  Presented following screening MMG with left breast distortion. Diagnostic MMG and US showed a 0.6 x 0.8 x 0.9 cm area of distortion at the 10 o'clock position of the left breast 5 cm from the nipple. No abnormal left axillary lymph nodes identified. Biopsy with IDC/DCIS, ER/PR +, her 2 -. 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  PMH significant for ovarian cancer post TAH/BSO and adjuvant chemotherapy in 2006. 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 On chronic neupogen. Diagnosed with recurrence 2.2016 with invasion bladder and underwent robotic-assisted lysis of adhesions, converted to laparotomy, radical upper vaginectomy and low anterior resection with colostomy. Bilateral ureteral stent placement and cystotomy repair. 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 Completed adjuvant radiation pelvis. 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 Tresa Moore. Reports she has not had frequent UTI but often has "mixed flora" on culture without true infection. Has supply of Levaquin to take incase UTI symptoms develop. Had area of perforation at ostomy with leakage stool abdominal wall and developed sepsis and required revision ostomy.   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   Prior 44C cup, DD when at hightest wt. Does not desire to go any smaller. Right mastectomy 1024 g Left 1130 g  Review of Systems     Objective:   Physical Exam  Cardiovascular: Normal rate, regular rhythm and normal heart sounds.  Pulmonary/Chest: Effort normal and breath sounds normal.     Chest incisions dry, epidermolysis bilateral adjacent to incisions with ischemic skin changes, likely  full thickness ischemia right lateral extent scar 1 x 6 cm, and and medial and central incision left Drains serous  Assessment:     Left breast cancer s/p bilateral mastectomies, TE reconstruction History ovarian ca. Neutropenia chronic    Plan:   Pictures today. Plan debridement skin margins. As complete muscle coverage, do not anticipate more than skin excision, but if any expander exposure may require replacement. Drains are ready for removal- will leave in place until surgery, plan expansion at end of debridement and drain removal. Patient off pain medications at this time.   Natrelle 133-MV-T 400 ml tissue expanders placed, initial fill volume 200 ml bilateral  Irene Limbo, MD [facility] Plastic & Reconstructive Surgery [phone], pin [phone]

## 2015-12-26 ENCOUNTER — Ambulatory Visit (HOSPITAL_BASED_OUTPATIENT_CLINIC_OR_DEPARTMENT_OTHER): Payer: BLUE CROSS/BLUE SHIELD | Admitting: Anesthesiology

## 2015-12-26 ENCOUNTER — Encounter (HOSPITAL_BASED_OUTPATIENT_CLINIC_OR_DEPARTMENT_OTHER): Payer: Self-pay | Admitting: Anesthesiology

## 2015-12-26 ENCOUNTER — Encounter (HOSPITAL_BASED_OUTPATIENT_CLINIC_OR_DEPARTMENT_OTHER)
Admission: RE | Disposition: A | Discharge status: Home / Self Care | Payer: Self-pay | Source: Ambulatory Visit | Attending: Plastic Surgery

## 2015-12-26 ENCOUNTER — Ambulatory Visit (HOSPITAL_BASED_OUTPATIENT_CLINIC_OR_DEPARTMENT_OTHER)
Admission: RE | Admit: 2015-12-26 | Discharge: 2015-12-26 | Disposition: A | Discharge status: Home / Self Care | Payer: BLUE CROSS/BLUE SHIELD | Source: Ambulatory Visit | Attending: Plastic Surgery | Admitting: Plastic Surgery

## 2015-12-26 DIAGNOSIS — Z881 Allergy status to other antibiotic agents status: Secondary | ICD-10-CM | POA: Diagnosis not present

## 2015-12-26 DIAGNOSIS — E119 Type 2 diabetes mellitus without complications: Secondary | ICD-10-CM | POA: Diagnosis not present

## 2015-12-26 DIAGNOSIS — D701 Agranulocytosis secondary to cancer chemotherapy: Secondary | ICD-10-CM | POA: Diagnosis not present

## 2015-12-26 DIAGNOSIS — K219 Gastro-esophageal reflux disease without esophagitis: Secondary | ICD-10-CM | POA: Diagnosis not present

## 2015-12-26 DIAGNOSIS — Z853 Personal history of malignant neoplasm of breast: Secondary | ICD-10-CM | POA: Insufficient documentation

## 2015-12-26 DIAGNOSIS — C50212 Malignant neoplasm of upper-inner quadrant of left female breast: Secondary | ICD-10-CM | POA: Diagnosis not present

## 2015-12-26 DIAGNOSIS — Z885 Allergy status to narcotic agent status: Secondary | ICD-10-CM | POA: Insufficient documentation

## 2015-12-26 DIAGNOSIS — Z8543 Personal history of malignant neoplasm of ovary: Secondary | ICD-10-CM | POA: Insufficient documentation

## 2015-12-26 DIAGNOSIS — Z880 Allergy status to penicillin: Secondary | ICD-10-CM | POA: Diagnosis not present

## 2015-12-26 DIAGNOSIS — Z9013 Acquired absence of bilateral breasts and nipples: Secondary | ICD-10-CM | POA: Diagnosis not present

## 2015-12-26 DIAGNOSIS — Z7984 Long term (current) use of oral hypoglycemic drugs: Secondary | ICD-10-CM | POA: Insufficient documentation

## 2015-12-26 DIAGNOSIS — N641 Fat necrosis of breast: Secondary | ICD-10-CM | POA: Diagnosis not present

## 2015-12-26 DIAGNOSIS — I10 Essential (primary) hypertension: Secondary | ICD-10-CM | POA: Diagnosis not present

## 2015-12-26 DIAGNOSIS — Z888 Allergy status to other drugs, medicaments and biological substances status: Secondary | ICD-10-CM | POA: Insufficient documentation

## 2015-12-26 DIAGNOSIS — T8131XA Disruption of external operation (surgical) wound, not elsewhere classified, initial encounter: Secondary | ICD-10-CM | POA: Diagnosis not present

## 2015-12-26 DIAGNOSIS — Z882 Allergy status to sulfonamides status: Secondary | ICD-10-CM | POA: Insufficient documentation

## 2015-12-26 DIAGNOSIS — L98499 Non-pressure chronic ulcer of skin of other sites with unspecified severity: Secondary | ICD-10-CM | POA: Diagnosis not present

## 2015-12-26 HISTORY — PX: INCISION AND DRAINAGE OF WOUND: SHX1803

## 2015-12-26 HISTORY — PX: TISSUE EXPANDER FILLING: SHX6698

## 2015-12-26 LAB — CBC WITH DIFFERENTIAL/PLATELET
BASOS PCT: 0 %
Basophils Absolute: 0 10*3/uL (ref 0.0–0.1)
EOS PCT: 1 %
Eosinophils Absolute: 0.1 10*3/uL (ref 0.0–0.7)
HEMATOCRIT: 34 % — AB (ref 36.0–46.0)
Hemoglobin: 10.2 g/dL — ABNORMAL LOW (ref 12.0–15.0)
LYMPHS PCT: 7 %
Lymphs Abs: 0.7 10*3/uL (ref 0.7–4.0)
MCH: 26 pg (ref 26.0–34.0)
MCHC: 30 g/dL (ref 30.0–36.0)
MCV: 86.7 fL (ref 78.0–100.0)
MONO ABS: 0.5 10*3/uL (ref 0.1–1.0)
Monocytes Relative: 5 %
NEUTROS ABS: 8 10*3/uL — AB (ref 1.7–7.7)
Neutrophils Relative %: 87 %
PLATELETS: 427 10*3/uL — AB (ref 150–400)
RBC: 3.92 MIL/uL (ref 3.87–5.11)
RDW: 16.1 % — AB (ref 11.5–15.5)
WBC: 9.3 10*3/uL (ref 4.0–10.5)

## 2015-12-26 LAB — POCT HEMOGLOBIN-HEMACUE: HEMOGLOBIN: 11.7 g/dL — AB (ref 12.0–15.0)

## 2015-12-26 LAB — GLUCOSE, CAPILLARY
GLUCOSE-CAPILLARY: 113 mg/dL — AB (ref 65–99)
Glucose-Capillary: 88 mg/dL (ref 65–99)

## 2015-12-26 SURGERY — IRRIGATION AND DEBRIDEMENT WOUND
Anesthesia: General | Site: Chest | Laterality: Bilateral

## 2015-12-26 MED ORDER — LACTATED RINGERS IV SOLN
INTRAVENOUS | Status: DC
  Administered 2015-12-26: 11:00:00 via INTRAVENOUS

## 2015-12-26 MED ORDER — VANCOMYCIN HCL IN DEXTROSE 1-5 GM/200ML-% IV SOLN
1000.0000 mg | INTRAVENOUS | Status: AC
  Administered 2015-12-26: 1000 mg via INTRAVENOUS

## 2015-12-26 MED ORDER — FENTANYL CITRATE (PF) 100 MCG/2ML IJ SOLN
50.0000 ug | INTRAMUSCULAR | Status: DC | PRN
  Administered 2015-12-26: 100 ug via INTRAVENOUS
  Administered 2015-12-26: 50 ug via INTRAVENOUS

## 2015-12-26 MED ORDER — CHLORHEXIDINE GLUCONATE 4 % EX LIQD: 1.0000 "application " | Freq: Once | CUTANEOUS | Status: DC

## 2015-12-26 MED ORDER — LIDOCAINE HCL (CARDIAC) 20 MG/ML IV SOLN
INTRAVENOUS | Status: DC | PRN
  Administered 2015-12-26: 50 mg via INTRAVENOUS

## 2015-12-26 MED ORDER — MEPERIDINE HCL 25 MG/ML IJ SOLN: 6.2500 mg | INTRAMUSCULAR | Status: DC | PRN

## 2015-12-26 MED ORDER — PROPOFOL 10 MG/ML IV BOLUS
INTRAVENOUS | Status: DC | PRN
  Administered 2015-12-26: 150 mg via INTRAVENOUS

## 2015-12-26 MED ORDER — MIDAZOLAM HCL 2 MG/2ML IJ SOLN
INTRAMUSCULAR | Status: AC
  Filled 2015-12-26: qty 2

## 2015-12-26 MED ORDER — DEXAMETHASONE SODIUM PHOSPHATE 4 MG/ML IJ SOLN
INTRAMUSCULAR | Status: DC | PRN
  Administered 2015-12-26: 10 mg via INTRAVENOUS

## 2015-12-26 MED ORDER — ONDANSETRON HCL 4 MG/2ML IJ SOLN
INTRAMUSCULAR | Status: DC | PRN
  Administered 2015-12-26: 4 mg via INTRAVENOUS

## 2015-12-26 MED ORDER — FENTANYL CITRATE (PF) 100 MCG/2ML IJ SOLN
INTRAMUSCULAR | Status: AC
  Filled 2015-12-26: qty 2

## 2015-12-26 MED ORDER — VANCOMYCIN HCL IN DEXTROSE 1-5 GM/200ML-% IV SOLN
INTRAVENOUS | Status: AC
  Filled 2015-12-26: qty 200

## 2015-12-26 MED ORDER — EPHEDRINE 5 MG/ML INJ
INTRAVENOUS | Status: AC
  Filled 2015-12-26: qty 10

## 2015-12-26 MED ORDER — SCOPOLAMINE 1 MG/3DAYS TD PT72
MEDICATED_PATCH | TRANSDERMAL | Status: AC
  Filled 2015-12-26: qty 1

## 2015-12-26 MED ORDER — HYDROMORPHONE HCL 1 MG/ML IJ SOLN: 0.2500 mg | INTRAMUSCULAR | Status: DC | PRN

## 2015-12-26 MED ORDER — CIPROFLOXACIN HCL 500 MG PO TABS: 500.0000 mg | ORAL_TABLET | Freq: Two times a day (BID) | ORAL | Status: DC

## 2015-12-26 MED ORDER — ONDANSETRON HCL 4 MG/2ML IJ SOLN: 4.0000 mg | Freq: Once | INTRAMUSCULAR | Status: DC | PRN

## 2015-12-26 MED ORDER — SUCCINYLCHOLINE CHLORIDE 20 MG/ML IJ SOLN
INTRAMUSCULAR | Status: DC | PRN
  Administered 2015-12-26: 100 mg via INTRAVENOUS

## 2015-12-26 MED ORDER — GLYCOPYRROLATE 0.2 MG/ML IJ SOLN: 0.2000 mg | Freq: Once | INTRAMUSCULAR | Status: DC | PRN

## 2015-12-26 MED ORDER — DEXAMETHASONE SODIUM PHOSPHATE 10 MG/ML IJ SOLN
INTRAMUSCULAR | Status: AC
  Filled 2015-12-26: qty 1

## 2015-12-26 MED ORDER — SODIUM CHLORIDE 0.9 % IR SOLN
Status: DC | PRN
  Administered 2015-12-26: 500 mL

## 2015-12-26 MED ORDER — EPHEDRINE SULFATE 50 MG/ML IJ SOLN
INTRAMUSCULAR | Status: DC | PRN
  Administered 2015-12-26: 10 mg via INTRAVENOUS

## 2015-12-26 MED ORDER — LIDOCAINE 2% (20 MG/ML) 5 ML SYRINGE
INTRAMUSCULAR | Status: AC
  Filled 2015-12-26: qty 5

## 2015-12-26 MED ORDER — MIDAZOLAM HCL 2 MG/2ML IJ SOLN
1.0000 mg | INTRAMUSCULAR | Status: DC | PRN
  Administered 2015-12-26: 2 mg via INTRAVENOUS

## 2015-12-26 MED ORDER — SCOPOLAMINE 1 MG/3DAYS TD PT72
1.0000 | MEDICATED_PATCH | Freq: Once | TRANSDERMAL | Status: DC | PRN
  Administered 2015-12-26: 1.5 mg via TRANSDERMAL

## 2015-12-26 MED ORDER — ONDANSETRON HCL 4 MG/2ML IJ SOLN
INTRAMUSCULAR | Status: AC
  Filled 2015-12-26: qty 2

## 2015-12-26 SURGICAL SUPPLY — 59 items
BAG DECANTER FOR FLEXI CONT (MISCELLANEOUS) ×2 IMPLANT
BLADE HEX COATED 2.75 (ELECTRODE) IMPLANT
BLADE SURG 10 STRL SS (BLADE) ×4 IMPLANT
BLADE SURG 15 STRL LF DISP TIS (BLADE) ×1 IMPLANT
BLADE SURG 15 STRL SS (BLADE) ×1
BNDG GAUZE ELAST 4 BULKY (GAUZE/BANDAGES/DRESSINGS) IMPLANT
CANISTER SUCT 1200ML W/VALVE (MISCELLANEOUS) ×2 IMPLANT
CHLORAPREP W/TINT 26ML (MISCELLANEOUS) ×2 IMPLANT
COVER BACK TABLE 60X90IN (DRAPES) IMPLANT
COVER MAYO STAND STRL (DRAPES) IMPLANT
DECANTER SPIKE VIAL GLASS SM (MISCELLANEOUS) IMPLANT
DRAIN CHANNEL 15F RND FF W/TCR (WOUND CARE) IMPLANT
DRAPE LAPAROSCOPIC ABDOMINAL (DRAPES) IMPLANT
DRAPE U-SHAPE 76X120 STRL (DRAPES) IMPLANT
DRSG PAD ABDOMINAL 8X10 ST (GAUZE/BANDAGES/DRESSINGS) IMPLANT
ELECT COATED BLADE 2.86 ST (ELECTRODE) ×2 IMPLANT
ELECT REM PT RETURN 9FT ADLT (ELECTROSURGICAL) ×2
ELECTRODE REM PT RTRN 9FT ADLT (ELECTROSURGICAL) ×1 IMPLANT
EVACUATOR SILICONE 100CC (DRAIN) IMPLANT
GAUZE SPONGE 4X4 12PLY STRL (GAUZE/BANDAGES/DRESSINGS) ×2 IMPLANT
GLOVE BIO SURGEON STRL SZ 6 (GLOVE) ×4 IMPLANT
GLOVE BIO SURGEON STRL SZ 6.5 (GLOVE) IMPLANT
GLOVE BIOGEL PI IND STRL 7.0 (GLOVE) ×1 IMPLANT
GLOVE BIOGEL PI IND STRL 7.5 (GLOVE) ×1 IMPLANT
GLOVE BIOGEL PI INDICATOR 7.0 (GLOVE) ×1
GLOVE BIOGEL PI INDICATOR 7.5 (GLOVE) ×1
GLOVE SURG SS PI 7.0 STRL IVOR (GLOVE) ×2 IMPLANT
GOWN STRL REUS W/ TWL LRG LVL3 (GOWN DISPOSABLE) ×2 IMPLANT
GOWN STRL REUS W/TWL LRG LVL3 (GOWN DISPOSABLE) ×2
IV NS 500ML (IV SOLUTION) ×1
IV NS 500ML BAXH (IV SOLUTION) ×1 IMPLANT
KIT FILL SYSTEM UNIVERSAL (SET/KITS/TRAYS/PACK) ×2 IMPLANT
LIQUID BAND (GAUZE/BANDAGES/DRESSINGS) ×4 IMPLANT
NEEDLE 21 GA WING INFUSION (NEEDLE) ×2 IMPLANT
NEEDLE HYPO 25X1 1.5 SAFETY (NEEDLE) IMPLANT
NS IRRIG 1000ML POUR BTL (IV SOLUTION) IMPLANT
PACK BASIN DAY SURGERY FS (CUSTOM PROCEDURE TRAY) ×2 IMPLANT
PACK UNIVERSAL I (CUSTOM PROCEDURE TRAY) ×2 IMPLANT
PENCIL BUTTON HOLSTER BLD 10FT (ELECTRODE) ×2 IMPLANT
PIN SAFETY STERILE (MISCELLANEOUS) IMPLANT
SLEEVE SCD COMPRESS KNEE MED (MISCELLANEOUS) ×2 IMPLANT
SPONGE GAUZE 4X4 12PLY STER LF (GAUZE/BANDAGES/DRESSINGS) IMPLANT
SPONGE LAP 18X18 X RAY DECT (DISPOSABLE) ×4 IMPLANT
STRIP CLOSURE SKIN 1/2X4 (GAUZE/BANDAGES/DRESSINGS) ×4 IMPLANT
SUT ETHILON 2 0 FS 18 (SUTURE) IMPLANT
SUT ETHILON 4 0 PS 2 18 (SUTURE) ×6 IMPLANT
SUT MNCRL AB 4-0 PS2 18 (SUTURE) ×4 IMPLANT
SUT VIC AB 3-0 FS2 27 (SUTURE) IMPLANT
SUT VIC AB 3-0 SH 27 (SUTURE) ×2
SUT VIC AB 3-0 SH 27X BRD (SUTURE) ×2 IMPLANT
SUT VICRYL 4-0 PS2 18IN ABS (SUTURE) ×4 IMPLANT
SWAB COLLECTION DEVICE MRSA (MISCELLANEOUS) IMPLANT
SWAB CULTURE ESWAB REG 1ML (MISCELLANEOUS) IMPLANT
SYR BULB IRRIGATION 50ML (SYRINGE) ×2 IMPLANT
SYR CONTROL 10ML LL (SYRINGE) IMPLANT
TOWEL OR 17X24 6PK STRL BLUE (TOWEL DISPOSABLE) ×6 IMPLANT
TUBE CONNECTING 20X1/4 (TUBING) ×2 IMPLANT
UNDERPAD 30X30 (UNDERPADS AND DIAPERS) ×4 IMPLANT
YANKAUER SUCT BULB TIP NO VENT (SUCTIONS) ×2 IMPLANT

## 2015-12-26 NOTE — Anesthesia Preprocedure Evaluation (Signed)
Anesthesia Evaluation  Patient identified by MRN, date of birth, ID band Patient awake    Reviewed: Allergy & Precautions, NPO status , Patient's Chart, lab work & pertinent test results  History of Anesthesia Complications (+) PONV  Airway Mallampati: I  TM Distance: >3 FB Neck ROM: Full    Dental   Pulmonary    Pulmonary exam normal        Cardiovascular hypertension, Pt. on medications Normal cardiovascular exam     Neuro/Psych    GI/Hepatic GERD  Medicated and Controlled,  Endo/Other  diabetes, Type 2, Oral Hypoglycemic Agents  Renal/GU      Musculoskeletal   Abdominal   Peds  Hematology   Anesthesia Other Findings   Reproductive/Obstetrics                             Anesthesia Physical Anesthesia Plan  ASA: II  Anesthesia Plan: General   Post-op Pain Management:    Induction: Intravenous  Airway Management Planned: Oral ETT  Additional Equipment:   Intra-op Plan:   Post-operative Plan: Extubation in OR  Informed Consent: I have reviewed the patients History and Physical, chart, labs and discussed the procedure including the risks, benefits and alternatives for the proposed anesthesia with the patient or authorized representative who has indicated his/her understanding and acceptance.     Plan Discussed with: CRNA and Surgeon  Anesthesia Plan Comments:         Anesthesia Quick Evaluation

## 2015-12-26 NOTE — Op Note (Signed)
Operative Note   DATE OF OPERATION: 5.26.17  LOCATION: [facility]-outpatient  SURGICAL DIVISION: Plastic Surgery  PREOPERATIVE DIAGNOSES:  1. Skin flap necrosis 2. Left breast cancer 3. Acquired absence bilateral breasts 3. Chemotherapy induced neutropenia, chronic  POSTOPERATIVE DIAGNOSES:  same  PROCEDURE:  1.Complex repair chest total 15 cm  SURGEON: Irene Limbo MD MBA  ASSISTANT: none  ANESTHESIA:  General.   EBL: 10 ml  COMPLICATIONS: None immediate.   INDICATIONS FOR PROCEDURE:  The patient, [name], is a 60 y.o. female born on July 11, 1956, is here for debridement incisional skin necrosis following bilateral mastectomies and immediate expander based reconstruction.    FINDINGS: Full thickness skin necrosis of incision margins, following debridement no exposure of expanders bilateral. Additional 60 ml of fluid infiltrated to right chest, and 75 ml to left chest.   DESCRIPTION OF PROCEDURE:  The patient's operative site was marked with the patient in the preoperative area. The patient was taken to the operating room. SCDs were placed and IV antibiotics were given. The patient's operative site was prepped and draped in a sterile fashion. A time out was performed and all information was confirmed to be correct.  Sharp excision of eschar areas completed with knife. Following this, subcutaneous fat excised over right that appeared ischemic Patient had total muscle coverage of expanders and no exposure noted. Wounds irrigated with polymyxin-bacitracin and layered closure completed with 4-0 vicryl in dermis and 4-0 nylon for skin closure. Length closure over bilateral chest 15 cm. Steri strips applied. Over left lateral breast, some dried slough or eschar noted. This was tangentially excised and noted to be superficial with bleeding dermis. Ports accessed and additional 60 ml added to right expander and 75 ml to left expander. Both JP drains were removed and dressed with  dry dressing. ABD pads applied to chest incisions.  The patient was allowed to wake from anesthesia, extubated and taken to the recovery room in satisfactory condition.   SPECIMENS: right and left mastectomy flaps  DRAINS: none  Irene Limbo, MD [facility]astic [facility] Surgery [phone], pin [phone]

## 2015-12-26 NOTE — Interval H&P Note (Signed)
History and Physical Interval Note:  12/26/2015 11:38 AM  [name]  has presented today for surgery, with the diagnosis of BREAST CANCER OF UPPER INNER QUADRANT OF LEFT FEMALE BREAST SKIN FLAP NECROSIS ACQUIRE ABSENT BREAST NIPPLE BILATERAL   The various methods of treatment have been discussed with the patient and family. After consideration of risks, benefits and other options for treatment, the patient has consented to  Procedure(s): DEBRIDEMENT OF BILATERAL MASTECTOMY FLAP, EXPANSION OF BILATERAL CHEST (Bilateral) as a surgical intervention .  The patient's history has been reviewed, patient examined, no change in status, stable for surgery.  I have reviewed the patient's chart and labs.  Questions were answered to the patient's satisfaction.     Sabiha Sura

## 2015-12-26 NOTE — Anesthesia Procedure Notes (Signed)
Procedure Name: Intubation Performed by: Terrance Mass Pre-anesthesia Checklist: Patient identified, Emergency Drugs available, Suction available and Patient being monitored Patient Re-evaluated:Patient Re-evaluated prior to inductionOxygen Delivery Method: Circle System Utilized Preoxygenation: Pre-oxygenation with 100% oxygen Intubation Type: IV induction Ventilation: Mask ventilation without difficulty Laryngoscope Size: Miller and 2 Grade View: Grade II Tube type: Oral Tube size: 7.0 mm Number of attempts: 1 Airway Equipment and Method: Stylet and Oral airway Placement Confirmation: ETT inserted through vocal cords under direct vision,  positive ETCO2 and breath sounds checked- equal and bilateral Secured at: 22 cm Tube secured with: Tape Dental Injury: Teeth and Oropharynx as per pre-operative assessment

## 2015-12-26 NOTE — Discharge Instructions (Signed)
°  Post Anesthesia Home Care Instructions ° °Activity: °Get plenty of rest for the remainder of the day. A responsible adult should stay with you for 24 hours following the procedure.  °For the next 24 hours, DO NOT: °-Drive a car °-Operate machinery °-Drink alcoholic beverages °-Take any medication unless instructed by your physician °-Make any legal decisions or sign important papers. ° °Meals: °Start with liquid foods such as gelatin or soup. Progress to regular foods as tolerated. Avoid greasy, spicy, heavy foods. If nausea and/or vomiting occur, drink only clear liquids until the nausea and/or vomiting subsides. Call your physician if vomiting continues. ° °Special Instructions/Symptoms: °Your throat may feel dry or sore from the anesthesia or the breathing tube placed in your throat during surgery. If this causes discomfort, gargle with warm salt water. The discomfort should disappear within 24 hours. ° °If you had a scopolamine patch placed behind your ear for the management of post- operative nausea and/or vomiting: ° °1. The medication in the patch is effective for 72 hours, after which it should be removed.  Wrap patch in a tissue and discard in the trash. Wash hands thoroughly with soap and water. °2. You may remove the patch earlier than 72 hours if you experience unpleasant side effects which may include dry mouth, dizziness or visual disturbances. °3. Avoid touching the patch. Wash your hands with soap and water after contact with the patch. °  ° °

## 2015-12-26 NOTE — Transfer of Care (Signed)
Immediate Anesthesia Transfer of Care Note  Patient: [name]  Procedure(s) Performed: Procedure(s): DEBRIDEMENT OF BILATERAL MASTECTOMY FLAPS,  EXPANSION OF BILATERAL CHEST TISSUE EXPANDERS (Bilateral)  Patient Location: PACU  Anesthesia Type:General  Level of Consciousness: awake and sedated  Airway & Oxygen Therapy: Patient Spontanous Breathing and Patient connected to face mask oxygen  Post-op Assessment: Report given to RN and Post -op Vital signs reviewed and stable  Post vital signs: Reviewed and stable  Last Vitals:  Filed Vitals:   12/26/15 1044  BP: 119/65  Pulse: 73  Temp: 36.8 C    Last Pain:  Filed Vitals:   12/26/15 1046  PainSc: 4       Patients Stated Pain Goal: 2 ([id number])  Complications: No apparent anesthesia complications

## 2015-12-26 NOTE — Anesthesia Postprocedure Evaluation (Signed)
Anesthesia Post Note  Patient: Financial planner  Procedure(s) Performed: Procedure(s) (LRB): DEBRIDEMENT OF BILATERAL MASTECTOMY FLAPS (Bilateral) EXPANSION OF BILATERAL CHEST TISSUE EXPANDERS (60 mL- Right; 75 mL- Left) (Bilateral)  Patient location during evaluation: PACU Anesthesia Type: General Level of consciousness: awake and alert and oriented Pain management: pain level controlled Vital Signs Assessment: post-procedure vital signs reviewed and stable Respiratory status: spontaneous breathing, nonlabored ventilation and respiratory function stable Cardiovascular status: blood pressure returned to baseline and stable Postop Assessment: no signs of nausea or vomiting Anesthetic complications: no    Last Vitals:  Filed Vitals:   12/26/15 1400 12/26/15 1415  BP: 127/62 125/60  Pulse: 67 66  Temp:    Resp: 13 13    Last Pain:  Filed Vitals:   12/26/15 1416  PainSc: 3                  Rebecah Dangerfield A.

## 2015-12-30 ENCOUNTER — Encounter (HOSPITAL_BASED_OUTPATIENT_CLINIC_OR_DEPARTMENT_OTHER): Payer: Self-pay | Admitting: Plastic Surgery

## 2016-01-01 ENCOUNTER — Encounter: Payer: Self-pay | Admitting: Hematology and Oncology

## 2016-01-01 ENCOUNTER — Telehealth: Payer: Self-pay | Admitting: Hematology and Oncology

## 2016-01-01 ENCOUNTER — Other Ambulatory Visit (HOSPITAL_BASED_OUTPATIENT_CLINIC_OR_DEPARTMENT_OTHER): Payer: BLUE CROSS/BLUE SHIELD

## 2016-01-01 ENCOUNTER — Ambulatory Visit (HOSPITAL_BASED_OUTPATIENT_CLINIC_OR_DEPARTMENT_OTHER): Payer: BLUE CROSS/BLUE SHIELD | Admitting: Hematology and Oncology

## 2016-01-01 VITALS — BP 128/55 | HR 66 | Temp 97.6°F | Resp 17 | Ht 63.0 in | Wt 210.2 lb

## 2016-01-01 DIAGNOSIS — C50212 Malignant neoplasm of upper-inner quadrant of left female breast: Secondary | ICD-10-CM | POA: Diagnosis not present

## 2016-01-01 DIAGNOSIS — Z8543 Personal history of malignant neoplasm of ovary: Secondary | ICD-10-CM

## 2016-01-01 DIAGNOSIS — Z8542 Personal history of malignant neoplasm of other parts of uterus: Secondary | ICD-10-CM | POA: Diagnosis not present

## 2016-01-01 DIAGNOSIS — M8580 Other specified disorders of bone density and structure, unspecified site: Secondary | ICD-10-CM

## 2016-01-01 DIAGNOSIS — D709 Neutropenia, unspecified: Secondary | ICD-10-CM

## 2016-01-01 DIAGNOSIS — Z170 Estrogen receptor positive status [ER+]: Secondary | ICD-10-CM

## 2016-01-01 LAB — CBC & DIFF AND RETIC
BASO%: 0.6 % (ref 0.0–2.0)
BASOS ABS: 0 10*3/uL (ref 0.0–0.1)
EOS ABS: 0.2 10*3/uL (ref 0.0–0.5)
EOS%: 4.7 % (ref 0.0–7.0)
HEMATOCRIT: 33.1 % — AB (ref 34.8–46.6)
HEMOGLOBIN: 10.1 g/dL — AB (ref 11.6–15.9)
IMMATURE RETIC FRACT: 10.9 % — AB (ref 1.60–10.00)
LYMPH#: 0.7 10*3/uL — AB (ref 0.9–3.3)
LYMPH%: 14 % (ref 14.0–49.7)
MCH: 26.6 pg (ref 25.1–34.0)
MCHC: 30.5 g/dL — ABNORMAL LOW (ref 31.5–36.0)
MCV: 87.1 fL (ref 79.5–101.0)
MONO#: 0.5 10*3/uL (ref 0.1–0.9)
MONO%: 10.4 % (ref 0.0–14.0)
NEUT%: 70.3 % (ref 38.4–76.8)
NEUTROS ABS: 3.3 10*3/uL (ref 1.5–6.5)
Platelets: 325 10*3/uL (ref 145–400)
RBC: 3.8 10*6/uL (ref 3.70–5.45)
RDW: 16.4 % — AB (ref 11.2–14.5)
RETIC %: 2.23 % — AB (ref 0.70–2.10)
RETIC CT ABS: 84.74 10*3/uL (ref 33.70–90.70)
WBC: 4.7 10*3/uL (ref 3.9–10.3)

## 2016-01-01 MED ORDER — ANASTROZOLE 1 MG PO TABS: 1.0000 mg | ORAL_TABLET | Freq: Every day | ORAL | Status: DC

## 2016-01-01 NOTE — Assessment & Plan Note (Signed)
-  Recent CT scan showed no evidence of cancer recurrence. She have close follow-up with gynecologist oncologist.

## 2016-01-01 NOTE — Assessment & Plan Note (Signed)
Due to her history of recurrent infection, I recommend we titrate the dosage of Neupogen to keep a consistently greater than 1.5 in the perioperative studding. Her CBC today is satisfactory with adequate ANC. I will review test results with her on a consistent basis and will adjust her treatment dose as needed

## 2016-01-01 NOTE — Patient Instructions (Signed)
Anastrozole tablets  What is this medicine?  ANASTROZOLE (an AS troe zole) is used to treat breast cancer in women who have gone through menopause. Some types of breast cancer depend on estrogen to grow, and this medicine can stop tumor growth by blocking estrogen production.  This medicine may be used for other purposes; ask your health care provider or pharmacist if you have questions.  What should I tell my health care provider before I take this medicine?  They need to know if you have any of these conditions:  -liver disease  -an unusual or allergic reaction to anastrozole, other medicines, foods, dyes, or preservatives  -pregnant or trying to get pregnant  -breast-feeding  How should I use this medicine?  Take this medicine by mouth with a glass of water. Follow the directions on the prescription label. You can take this medicine with or without food. Take your doses at regular intervals. Do not take your medicine more often than directed. Do not stop taking except on the advice of your doctor or health care professional.  Talk to your pediatrician regarding the use of this medicine in children. Special care may be needed.  Overdosage: If you think you have taken too much of this medicine contact a poison control center or emergency room at once.  NOTE: This medicine is only for you. Do not share this medicine with others.  What if I miss a dose?  If you miss a dose, take it as soon as you can. If it is almost time for your next dose, take only that dose. Do not take double or extra doses.  What may interact with this medicine?  Do not take this medicine with any of the following medications:  -female hormones, like estrogens or progestins and birth control pills  This medicine may also interact with the following medications:  -tamoxifen  This list may not describe all possible interactions. Give your health care provider a list of all the medicines, herbs, non-prescription drugs, or dietary supplements you  use. Also tell them if you smoke, drink alcohol, or use illegal drugs. Some items may interact with your medicine.  What should I watch for while using this medicine?  Visit your doctor or health care professional for regular checks on your progress. Let your doctor or health care professional know about any unusual vaginal bleeding.  Do not treat yourself for diarrhea, nausea, vomiting or other side effects. Ask your doctor or health care professional for advice.  What side effects may I notice from receiving this medicine?  Side effects that you should report to your doctor or health care professional as soon as possible:  -allergic reactions like skin rash, itching or hives, swelling of the face, lips, or tongue  -any new or unusual symptoms  -breathing problems  -chest pain  -leg pain or swelling  -vomiting  Side effects that usually do not require medical attention (report to your doctor or health care professional if they continue or are bothersome):  -back or bone pain  -cough, or throat infection  -diarrhea or constipation  -dizziness  -headache  -hot flashes  -loss of appetite  -nausea  -sweating  -weakness and tiredness  -weight gain  This list may not describe all possible side effects. Call your doctor for medical advice about side effects. You may report side effects to FDA at 1-800-FDA-1088.  Where should I keep my medicine?  Keep out of the reach of children.  Store at room temperature   between 20 and 25 degrees C (68 and 77 degrees F). Throw away any unused medicine after the expiration date.  NOTE: This sheet is a summary. It may not cover all possible information. If you have questions about this medicine, talk to your doctor, pharmacist, or health care provider.      2016, [employer]. (2007-09-29 16:31:52)

## 2016-01-01 NOTE — Progress Notes (Signed)
[facility] OFFICE PROGRESS NOTE  Patient Care Team: Ann Held, DO as PCP - General (Family Medicine) Heath Lark, MD as Consulting Physician (Hematology and Oncology) Everitt Amber, MD as Consulting Physician (Obstetrics and Gynecology) Michael [name], MD as Consulting Physician (General Surgery) Alexis Frock, MD as Consulting Physician (Urology) Lorne Skeens, MD as Consulting Physician (Endocrinology) Katy Apo, MD as Consulting Physician (Ophthalmology) Stark Klein, MD as Consulting Physician (General Surgery) Irene Limbo, MD as Consulting Physician (Plastic Surgery)  SUMMARY OF ONCOLOGIC HISTORY: Oncology History   Breast cancer of upper-inner quadrant of left female breast [facility])   Staging form: Breast, AJCC 7th Edition     Clinical stage from 12/31/2015: Stage IIA (T2, N0, M0) - Signed by Heath Lark, MD on 12/31/2015  ER,PR positive Her2 neg     History of ovarian & endometrial cancer   10/15/2004 Initial Diagnosis History of ovarian cancer, treated with chemotherapy carbo/Taxol   03/17/2009 Bone Marrow Biopsy Bone marrow biopsy at [facility] showed neutropenia   01/23/2014 Bone Marrow Biopsy Repeat bone marrow biopsy showed neutropenia   09/12/2014 Imaging CT scan showed possible cancer   10/14/2014 Imaging MRI show significant pelvic mass without invasion into the bladder but abutting to the rectum   11/19/2014 Surgery she underwent surgery and had robotic-assisted lysis of adhesions, converted to laparotomy, radical upper vaginectomy and low anterior resection with colostomy. 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 Resection margins were negative   01/29/2015 - 03/10/2015 Radiation Therapy She received adjuvant radiation   03/03/2015 Imaging CT scan of the  abdomen and pelvis showed status post interval removal of the bilateral nephroureteral stents. Worsening moderate right hydroureteronephrosis. Resolved left hydroureteronephrosis.   03/20/2015 Procedure she had cystoscopy and stent placement for right hydronephrosis   05/02/2015 Surgery Cystoscopy with right retrograde pyelogram interpretation. Diagnostic ureteroscopy. Removal of right ureteral stent.   05/14/2015 Imaging CT scan of the abdomen and pelvis show unilateral right hydronephrosis with no residual cancer   08/18/2015 Imaging CT scan showed hysterectomy with stable presacral soft tissue thickening. No definitive evidence of recurrent or metastatic disease. Severe right hydronephrosis   09/12/2015 Surgery Cystoscopy with right retrograde pyelogram interpretation. Right ureteral stent placement 5 x 24 Polaris, no tether    Breast cancer of upper-inner quadrant of left female breast ([place])   10/14/2015 Imaging Screening mammogram showed possible distortion in the left breast.   10/24/2015 Imaging Targeted ultrasound is performed, showing a 0.6 x 0.8 x 0.9 cm area of hypoechoic distortion at the 10 o'clock position of the left breast 5 cm from the nipple   10/24/2015 Imaging Diagnostic imaging confirmed 0.6 x 0.8 x 0.9 cm distortion in the upper inner left breast   10/30/2015 Procedure Left US guided biopsy was performed   10/30/2015 Pathology Results Accession: [id number] showed invasive ductal carcinoma, ER/PR positive, Her2 neg   12/11/2015 Surgery She undwerwent bilateral mastectomy, left SLN biopsy and immediate reconstruction surgeries with bilateral plasma expanders   12/11/2015 Pathology Results Accession: [id number] mastectomy specimens showed left breast ca, pT2N0M0   12/26/2015 Pathology Results Accession: [id number] specimen from debridement showed no cancer   12/26/2015 Surgery she underwent surgical debridement of necrotic tissue at site of plasma expander    INTERVAL HISTORY: Please see  below for problem oriented charting.She is here today accompanied by her husband. She returns for further follow-up. She  underwent recent surgical debridement for infection and is currently taking antibiotic therapy. She denies pain. She is anxious regarding the role of adjuvant treatment.  REVIEW OF SYSTEMS:   Constitutional: Denies fevers, chills or abnormal weight loss Eyes: Denies blurriness of vision Ears, nose, mouth, throat, and face: Denies mucositis or sore throat Respiratory: Denies cough, dyspnea or wheezes Cardiovascular: Denies palpitation, chest discomfort or lower extremity swelling Gastrointestinal:  Denies nausea, heartburn or change in bowel habits Skin: Denies abnormal skin rashes Lymphatics: Denies new lymphadenopathy or easy bruising Neurological:Denies numbness, tingling or new weaknesses Behavioral/Psych: Mood is stable, no new changes  All other systems were reviewed with the patient and are negative.  I have reviewed the past medical history, past surgical history, social history and family history with the patient and they are unchanged from previous note.  ALLERGIES:  is allergic to penicillins; ultram; adhesive; cefaclor; erythromycin; trimethoprim; pectin; and sulfa antibiotics.  MEDICATIONS:  Current Outpatient Prescriptions  Medication Sig Dispense Refill  . Biotin 5 MG TABS Take 5 mg by mouth every morning.     . Canagliflozin (INVOKANA) 300 MG TABS Take 300 mg by mouth every morning.     . Cholecalciferol (VITAMIN D3) 10000 UNITS capsule Take 10,000 Units by mouth once a week. Sundays    . diphenhydrAMINE (BENADRYL) 25 MG tablet Take 25 mg by mouth every 4 (four) hours as needed for itching, allergies or sleep. Reported on 09/15/2015    . filgrastim (NEUPOGEN) 480 MCG/1.6ML injection Inject 1.6 ml under the skin every 3 days for life 16 mL 11  . hyaluronate sodium (RADIAPLEXRX) GEL Apply 1 application topically 2 (two) times daily as needed (scars).  Reported on 09/15/2015    . levofloxacin (LEVAQUIN) 750 MG tablet     . levothyroxine (SYNTHROID) 175 MCG tablet Take 87.5-175 mcg by mouth daily before breakfast. 162mg daily except 87.534m on Sundays.    . [name] Kitchenoratadine (CLARITIN) 10 MG tablet Take 10 mg by mouth daily.    . metFORMIN (GLUCOPHAGE) 1000 MG tablet Take 1,000 mg by mouth 2 (two) times daily with a meal.     . methocarbamol (ROBAXIN) 500 MG tablet Take 1 tablet (500 mg total) by mouth every 8 (eight) hours as needed for muscle spasms. 30 tablet 0  . Multiple Vitamin (MULTIVITAMIN WITH MINERALS) TABS tablet Take 1 tablet by mouth daily.    . [name] Kitchenmega-3 acid ethyl esters (LOVAZA) 1 G capsule Take 1 g by mouth 2 (two) times daily.    . [name] Kitchenmeprazole (PRILOSEC) 20 MG capsule Take 20 mg by mouth at bedtime.     . Vladimir Fasterlycol-Propyl Glycol (SYSTANE OP) Place 1 drop into both eyes at bedtime.     . rosuvastatin (CRESTOR) 10 MG tablet Take 10 mg by mouth every evening.     . sitaGLIPtin (JANUVIA) 100 MG tablet Take 100 mg by mouth daily.    . valsartan (DIOVAN) 80 MG tablet Take 80 mg by mouth daily.    . [name] Kitchennastrozole (ARIMIDEX) 1 MG tablet Take 1 tablet (1 mg total) by mouth daily. 90 tablet 3  . oxyCODONE (OXY IR/ROXICODONE) 5 MG immediate release tablet Take 1-2 tablets (5-10 mg total) by mouth every 4 (four) hours as needed for moderate pain. (Patient not taking: Reported on 01/01/2016) 50 tablet 0  . polyethylene glycol (MIRALAX / GLYCOLAX) packet Take 17 g by mouth. Reported on 01/01/2016     No current facility-administered medications for this visit.    PHYSICAL EXAMINATION: ECOG PERFORMANCE STATUS:  1 - Symptomatic but completely ambulatory  Filed Vitals:   01/01/16 1045  BP: 128/55  Pulse: 66  Temp: 97.6 F (36.4 C)  Resp: 17   Filed Weights   01/01/16 1045  Weight: 210 lb 3.2 oz (95.346 kg)    GENERAL:alert, no distress and comfortable SKIN: skin color, texture, turgor are normal, no rashes or significant lesions EYES:  normal, Conjunctiva are pink and non-injected, sclera clear Musculoskeletal:no cyanosis of digits and no clubbing  NEURO: alert & oriented x 3 with fluent speech, no focal motor/sensory deficits  LABORATORY DATA:  I have reviewed the data as listed    Component Value Date/Time   NA 141 12/12/2015 0631   NA 142 11/26/2015 1020   K 3.4* 12/12/2015 0631   K 3.7 11/26/2015 1020   CL 102 12/12/2015 0631   CO2 28 12/12/2015 0631   CO2 28 11/26/2015 1020   GLUCOSE 126* 12/12/2015 0631   GLUCOSE 117 11/26/2015 1020   BUN 11 12/12/2015 0631   BUN 14.4 11/26/2015 1020   CREATININE 1.04* 12/12/2015 0631   CREATININE 1.1 11/26/2015 1020   CREATININE 0.80 01/06/2015 1610   CALCIUM 8.0* 12/12/2015 0631   CALCIUM 9.4 11/26/2015 1020   PROT 7.3 11/26/2015 1020   PROT 7.1 08/28/2015 1617   ALBUMIN 2.8* 11/26/2015 1020   ALBUMIN 4.1 08/28/2015 1617   AST 13 11/26/2015 1020   AST 17 08/28/2015 1617   ALT 18 11/26/2015 1020   ALT 22 08/28/2015 1617   ALKPHOS 86 11/26/2015 1020   ALKPHOS 79 08/28/2015 1617   BILITOT <0.30 11/26/2015 1020   BILITOT 0.4 08/28/2015 1617   GFRNONAA 57* 12/12/2015 0631   GFRNONAA 78 12/16/2014 1530   GFRAA >60 12/12/2015 0631   GFRAA >89 12/16/2014 1530    No results found for: SPEP, UPEP  Lab Results  Component Value Date   WBC 4.7 01/01/2016   NEUTROABS 3.3 01/01/2016   HGB 10.1* 01/01/2016   HCT 33.1* 01/01/2016   MCV 87.1 01/01/2016   PLT 325 01/01/2016      Chemistry      Component Value Date/Time   NA 141 12/12/2015 0631   NA 142 11/26/2015 1020   K 3.4* 12/12/2015 0631   K 3.7 11/26/2015 1020   CL 102 12/12/2015 0631   CO2 28 12/12/2015 0631   CO2 28 11/26/2015 1020   BUN 11 12/12/2015 0631   BUN 14.4 11/26/2015 1020   CREATININE 1.04* 12/12/2015 0631   CREATININE 1.1 11/26/2015 1020   CREATININE 0.80 01/06/2015 1610      Component Value Date/Time   CALCIUM 8.0* 12/12/2015 0631   CALCIUM 9.4 11/26/2015 1020   ALKPHOS 86  11/26/2015 1020   ALKPHOS 79 08/28/2015 1617   AST 13 11/26/2015 1020   AST 17 08/28/2015 1617   ALT 18 11/26/2015 1020   ALT 22 08/28/2015 1617   BILITOT <0.30 11/26/2015 1020   BILITOT 0.4 08/28/2015 1617     ASSESSMENT & PLAN:  Breast cancer of upper-inner quadrant of left female breast ([place]) I reviewed with her and her husband report from recent surgical pathology report. Final staging is T2 N0 M0, ER/PR positive HER-2/neu negative left breast cancer, status post bilateral mastectomy. She does not need further local therapy such as radiation treatment. All margins were negative and SLN biopsies were negative. We discussed the role of adjuvant treatment. Given her chronic neutropenia and history of recurrent infection, the patient is not a candidate for adjuvant chemotherapy. I have  discussed with the physicians and we believe that is no role for Oncotype DX testing. We discussed the risks, benefits, side effects of adjuvant endocrine therapy, including the risk of menopausal symptoms such as hot flashes, mood swings, weight gain, hair loss, risks of depression and increased risk of osteoporosis.  We discussed the offenses between tamoxifen versus aromatase inhibitor. I recommend she wait for another month to allow adequate healing from recent surgery before we start adjuvant treatment. I will order baseline bone density scan. The patient would be an excellent candidate for Arimidex therapy. Duration for treatment would be for 5 years. We will begin treatment early July and I will see her back in August for review of side effects of treatment.  Chronic neutropenia Due to her history of recurrent infection, I recommend we titrate the dosage of Neupogen to keep a consistently greater than 1.5 in the perioperative studding. Her CBC today is satisfactory with adequate ANC. I will review test results with her on a consistent basis and will adjust her treatment dose as needed    History  of ovarian & endometrial cancer -Recent CT scan showed no evidence of cancer recurrence. She have close follow-up with gynecologist oncologist.     Orders Placed This Encounter  Procedures  . DG Bone Density    PF:  EAGLE 48YRS / NO SUPP/ NO NEEDS/ EPIC ORDER INS: [employer] JTB/CARLINE    Standing Status: Future     Number of Occurrences:      Standing Expiration Date: 12/31/2016    Order Specific Question:  Reason for Exam (SYMPTOM  OR DIAGNOSIS REQUIRED)    Answer:  osteopenia, breast ca    Order Specific Question:  Preferred imaging location?    Answer:  [facility]   All questions were answered. The patient knows to call the clinic with any problems, questions or concerns. No barriers to learning was detected. I spent 25 minutes counseling the patient face to face. The total time spent in the appointment was 40 minutes and more than 50% was on counseling and review of test results     [facility], [place], MD 01/01/2016 4:20 PM

## 2016-01-01 NOTE — Telephone Encounter (Signed)
Gave pt apt & avs august apt dates/times were give in pof

## 2016-01-01 NOTE — Assessment & Plan Note (Signed)
I reviewed with her and her husband report from recent surgical pathology report. Final staging is T2 N0 M0, ER/PR positive HER-2/neu negative left breast cancer, status post bilateral mastectomy. She does not need further local therapy such as radiation treatment. All margins were negative and SLN biopsies were negative. We discussed the role of adjuvant treatment. Given her chronic neutropenia and history of recurrent infection, the patient is not a candidate for adjuvant chemotherapy. I have discussed with the physicians and we believe that is no role for Oncotype DX testing. We discussed the risks, benefits, side effects of adjuvant endocrine therapy, including the risk of menopausal symptoms such as hot flashes, mood swings, weight gain, hair loss, risks of depression and increased risk of osteoporosis.  We discussed the offenses between tamoxifen versus aromatase inhibitor. I recommend she wait for another month to allow adequate healing from recent surgery before we start adjuvant treatment. I will order baseline bone density scan. The patient would be an excellent candidate for Arimidex therapy. Duration for treatment would be for 5 years. We will begin treatment early July and I will see her back in August for review of side effects of treatment.

## 2016-01-12 ENCOUNTER — Telehealth: Payer: Self-pay | Admitting: *Deleted

## 2016-01-12 ENCOUNTER — Ambulatory Visit
Admission: RE | Admit: 2016-01-12 | Discharge: 2016-01-12 | Disposition: A | Discharge status: Home / Self Care | Payer: BLUE CROSS/BLUE SHIELD | Source: Ambulatory Visit | Attending: Hematology and Oncology | Admitting: Hematology and Oncology

## 2016-01-12 DIAGNOSIS — Z780 Asymptomatic menopausal state: Secondary | ICD-10-CM | POA: Diagnosis not present

## 2016-01-12 DIAGNOSIS — M8580 Other specified disorders of bone density and structure, unspecified site: Secondary | ICD-10-CM

## 2016-01-12 DIAGNOSIS — Z13820 Encounter for screening for osteoporosis: Secondary | ICD-10-CM | POA: Diagnosis not present

## 2016-01-12 NOTE — Telephone Encounter (Signed)
-----   Message from Heath Lark, MD sent at 01/12/2016  1:40 PM EDT ----- Regarding: bone density Pls let her know bone density is excellent ----- Message -----    From: Rad Results In Interface    Sent: 01/12/2016   1:27 PM      To: Heath Lark, MD

## 2016-01-12 NOTE — Telephone Encounter (Signed)
Informed pt of Dr. Calton Dach message regarding bone density scan.  She verbalized understanding.

## 2016-01-13 DIAGNOSIS — E119 Type 2 diabetes mellitus without complications: Secondary | ICD-10-CM | POA: Diagnosis not present

## 2016-01-13 DIAGNOSIS — E038 Other specified hypothyroidism: Secondary | ICD-10-CM | POA: Diagnosis not present

## 2016-01-13 DIAGNOSIS — E782 Mixed hyperlipidemia: Secondary | ICD-10-CM | POA: Diagnosis not present

## 2016-01-13 DIAGNOSIS — E559 Vitamin D deficiency, unspecified: Secondary | ICD-10-CM | POA: Diagnosis not present

## 2016-01-15 DIAGNOSIS — I10 Essential (primary) hypertension: Secondary | ICD-10-CM | POA: Diagnosis not present

## 2016-01-15 DIAGNOSIS — E782 Mixed hyperlipidemia: Secondary | ICD-10-CM | POA: Diagnosis not present

## 2016-01-15 DIAGNOSIS — E038 Other specified hypothyroidism: Secondary | ICD-10-CM | POA: Diagnosis not present

## 2016-01-15 DIAGNOSIS — E119 Type 2 diabetes mellitus without complications: Secondary | ICD-10-CM | POA: Diagnosis not present

## 2016-01-18 IMAGING — CT CT ABD-PELV W/ CM
2 of 7 series · 15 of 46 positions shown, 17 images · IV contrast (OMNIPAQUE)
Comparison: 12/30/2014

CLINICAL DATA: History of ovarian and endometrial cancer 6009, Ree
occurrence 4423. Excision of bladder wall with bilateral stents. Low
back pain.

EXAM:
CT ABDOMEN AND PELVIS WITHOUT AND WITH CONTRAST
TECHNIQUE: Multidetector CT imaging of the abdomen and pelvis was performed
following the standard protocol before and following the bolus
administration of intravenous contrast.
CONTRAST:  100mL OMNIPAQUE IOHEXOL 300 MG/ML  SOLN

[Series 2: rtn a/p with · axial · 0.94mm/px · z∈[-640,-265]mm · 12 of 89 slices shown, 14 images]
[im 7/89  soft-tissue]
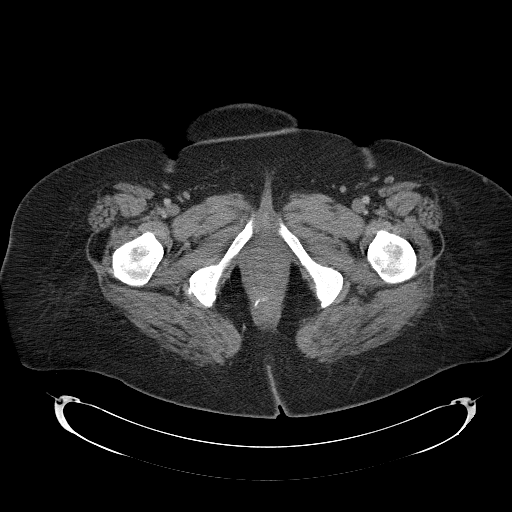
[im 7/89  bone]
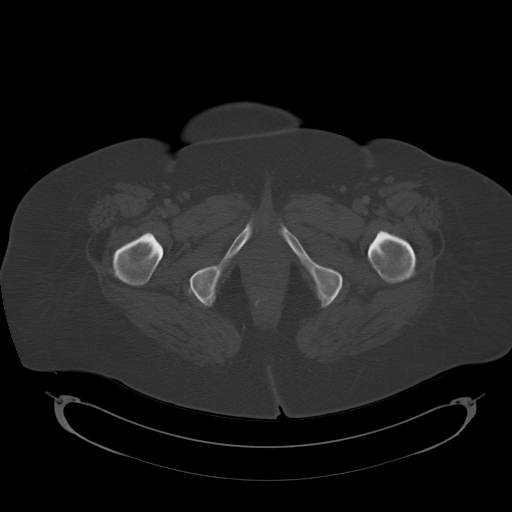
[im 14/89  soft-tissue]
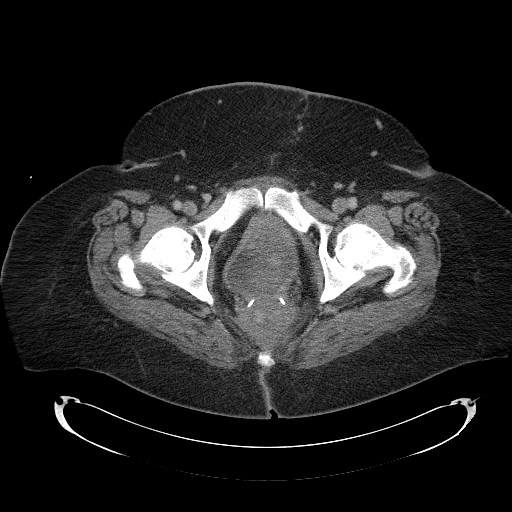
[im 21/89  soft-tissue]
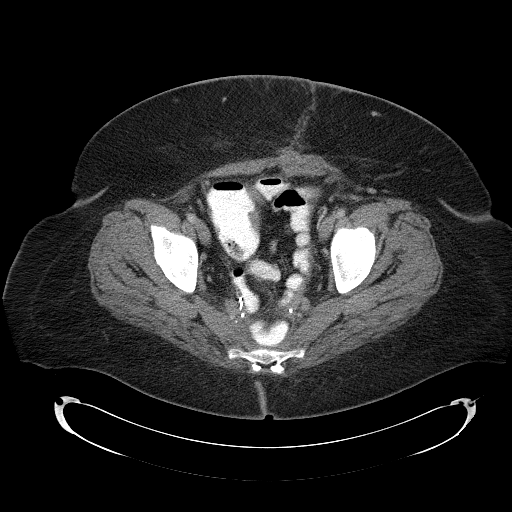
[im 28/89  soft-tissue]
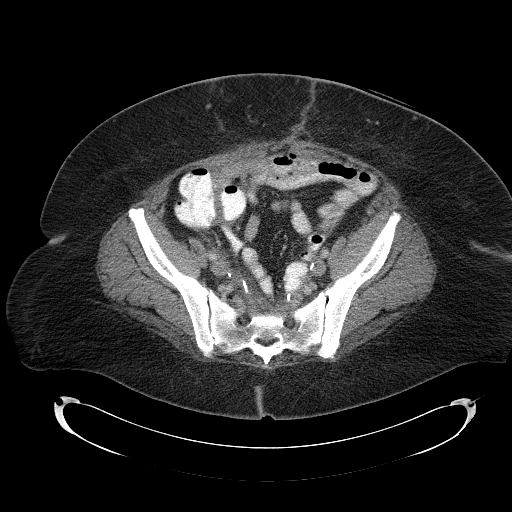
[im 34/89  soft-tissue]
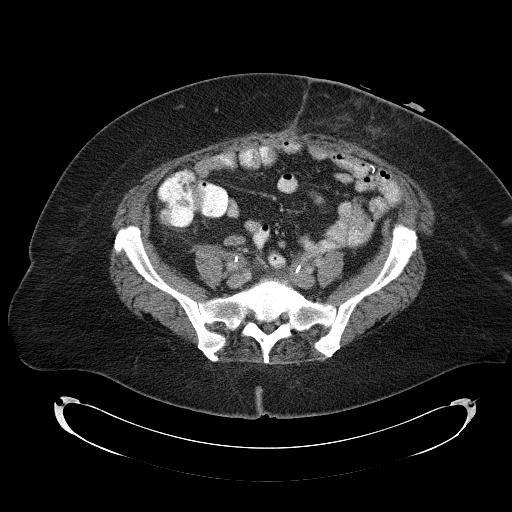
[im 41/89  soft-tissue]
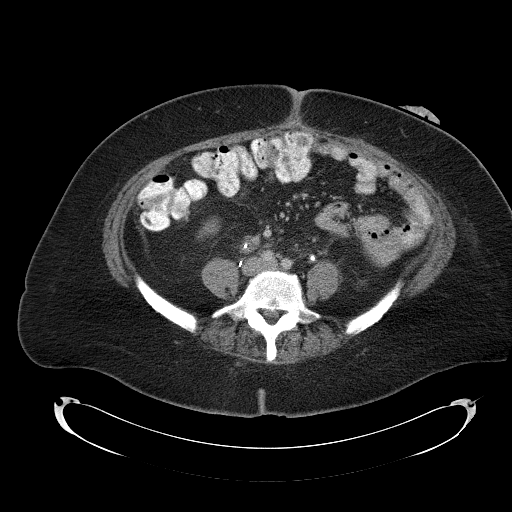
[im 48/89  soft-tissue]
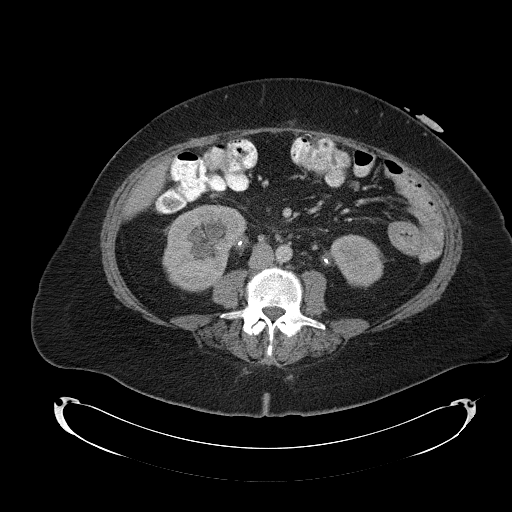
[im 55/89  soft-tissue]
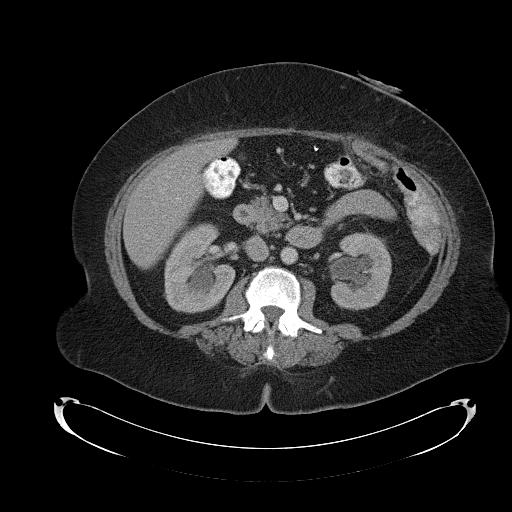
[im 61/89  soft-tissue]
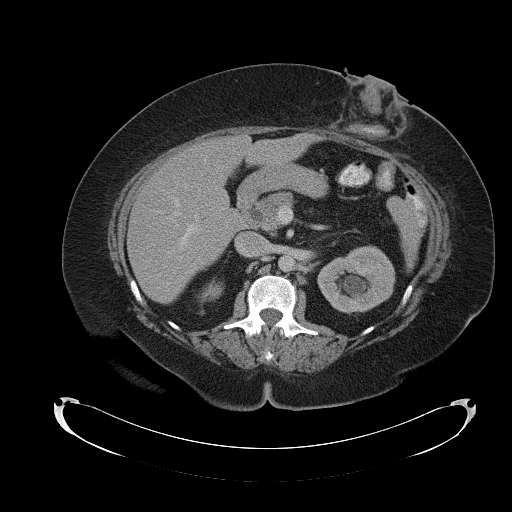
[im 61/89  bone]
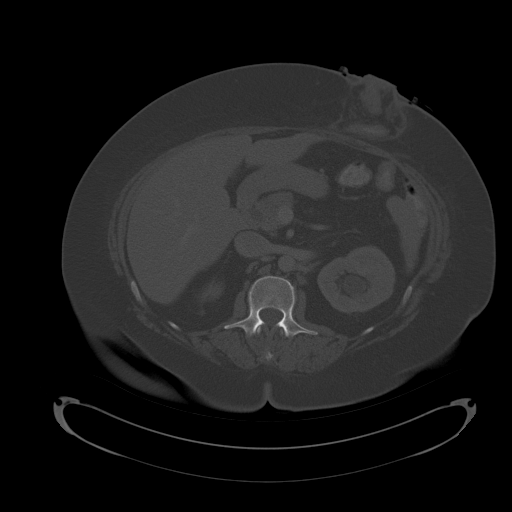
[im 68/89  soft-tissue]
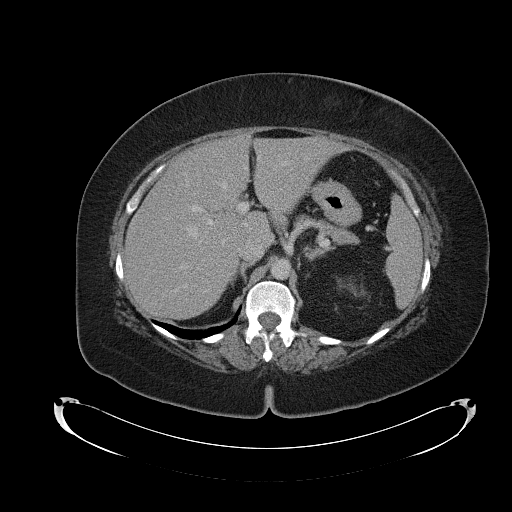
[im 75/89  soft-tissue]
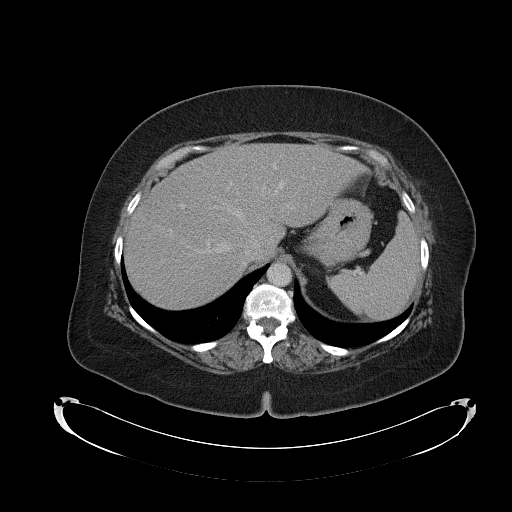
[im 82/89  soft-tissue]
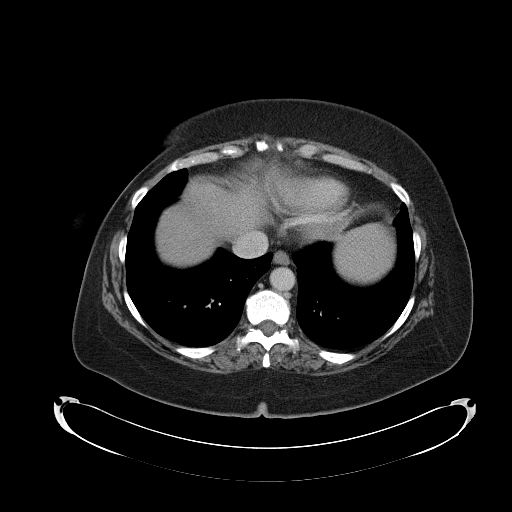

[Series 603: <mpr thick range(1)> · coronal · 0.94mm/px · 3 of 119 slices shown]
[im 40/119  soft-tissue]
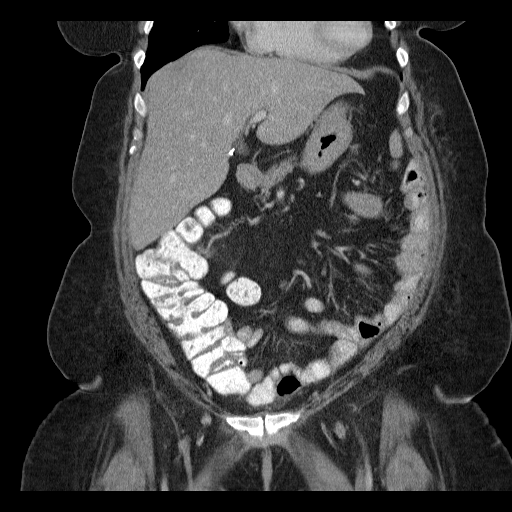
[im 53/119  soft-tissue]
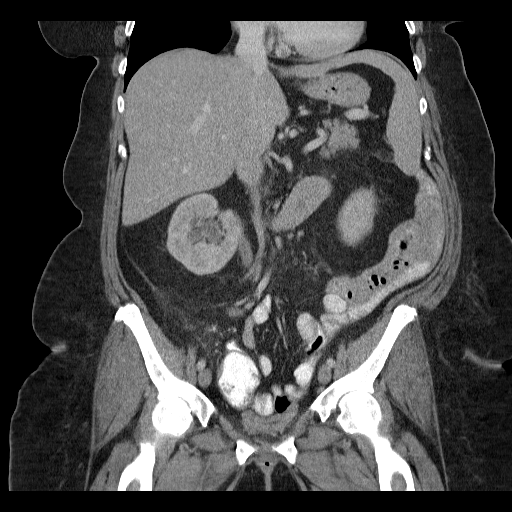
[im 66/119  soft-tissue]
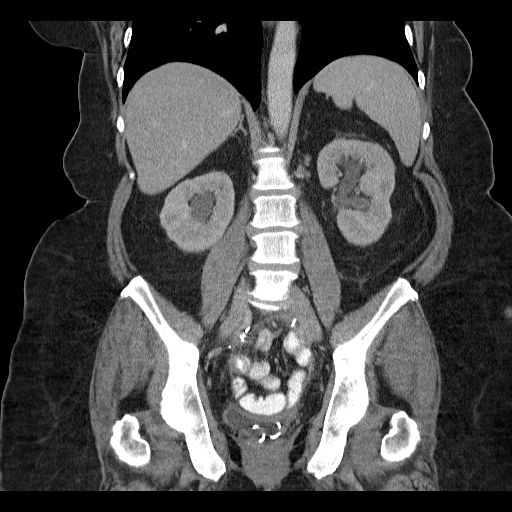

[15 of 46 positions shown; findings below may reference images not displayed]

FINDINGS: Lower chest: Lung bases are clear. No effusions. Heart is normal
size.

Hepatobiliary: Prior cholecystectomy. No biliary ductal dilatation
or focal hepatic lesion

Pancreas: No focal abnormality or ductal dilatation.

Spleen: No focal abnormality.  Normal size.

Adrenals/Urinary Tract: No focal adrenal lesion. There is mild
bilateral hydronephrosis with bilateral ureteral stents in place,
similar to prior study. Ureteral stents in the decompressed bladder
which is grossly unremarkable.

Stomach/Bowel: Prior left partial colectomy with left lower quadrant
ostomy. Stomach and small bowel are unremarkable. No evidence of
bowel obstruction.

Vascular/Lymphatic: Small right lower quadrant mesenteric lymph
nodes, stable and not pathologically enlarged. No retroperitoneal or
mesenteric adenopathy. No aneurysm.

Reproductive: Prior hysterectomy.  No adnexal masses.

Other: No free fluid or free air.

Musculoskeletal: No focal bone lesion or acute bony abnormality.
IMPRESSION: Stable mild bilateral hydronephrosis with ureteral stents in stable
position.

Left lower quadrant ostomy again noted and changes from partial left
colectomy. No complicating features.

No acute findings.

## 2016-01-19 ENCOUNTER — Ambulatory Visit: Payer: BLUE CROSS/BLUE SHIELD | Attending: General Surgery | Admitting: Physical Therapy

## 2016-01-19 ENCOUNTER — Encounter: Payer: Self-pay | Admitting: Physical Therapy

## 2016-01-19 DIAGNOSIS — M25611 Stiffness of right shoulder, not elsewhere classified: Secondary | ICD-10-CM | POA: Diagnosis not present

## 2016-01-19 DIAGNOSIS — M25512 Pain in left shoulder: Secondary | ICD-10-CM | POA: Insufficient documentation

## 2016-01-19 DIAGNOSIS — M25612 Stiffness of left shoulder, not elsewhere classified: Secondary | ICD-10-CM | POA: Insufficient documentation

## 2016-01-19 DIAGNOSIS — M25511 Pain in right shoulder: Secondary | ICD-10-CM | POA: Insufficient documentation

## 2016-01-19 NOTE — Therapy (Signed)
[place], [place], [address] Phone: [phone]   Fax:  [phone]  Physical Therapy Treatment  Patient Details  Name: [name] MRN: [id number] Date of Birth: Aug 25, 1955 Referring Provider: Barry Dienes  Encounter Date: 01/19/2016      PT End of Session - 01/19/16 1541    Visit Number 1   Number of Visits 17   Date for PT Re-Evaluation 03/15/16   PT Start Time 1350   PT Stop Time 1433   PT Time Calculation (min) 43 min   Activity Tolerance Patient tolerated treatment well   Behavior During Therapy [facility] for tasks assessed/performed      Past Medical History  Diagnosis Date  . Benign essential HTN 12/14/2011  . Multiple thyroid nodules     Managed by Dr. Harlow Asa  . Dysuria   . Radiation-induced dermatitis     contact dermatitis , radiation completed, rash only on ankles now.  . History of gastric polyp     2014  duodenum  . History of radiation therapy     01-29-2015 to 03-10-2015  pelvis 50.4Gy  . Hypothyroidism   . DM type 2 (diabetes mellitus, type 2) ([place])   . Hiatal hernia   . Anemia in neoplastic disease   . Chronic idiopathic neutropenia (HCC)     presumed related to chemotherapy March 2006--- followed by dr Alvy Bimler   . Ureteral stricture, right   . S/P colostomy ([place])   . Hydronephrosis   . History of renal stent   . Neutropenia ([place])   . Complication of anesthesia     PONV  . GERD (gastroesophageal reflux disease)     takes omeprazole  . Wears glasses   . Seasonal allergies   . History of bronchitis   . Cancer of corpus uteri, except isthmus [facility]) oncologist-- dr Denman [name] and dr Alvy Bimler     Mar 2006 dx endometrial and ovarian cancer s/p  chemotheapy and surgery  . Breast cancer, left ([place]) 2017  . PONV (postoperative nausea and vomiting)     "scopolamine patch works for me"    Past Surgical History  Procedure Laterality Date  . Appendectomy    . Tonsillectomy    . Eye  surgery      pytosis of eyelids-child  . Colonoscopy with propofol N/A 08/21/2013    Procedure: COLONOSCOPY WITH PROPOFOL;  Surgeon: Cleotis Nipper, MD;  Location: [facility];  Service: Endoscopy;  Laterality: N/A;  . Eus N/A 10/02/2014    Procedure: LOWER ENDOSCOPIC ULTRASOUND (EUS);  Surgeon: Arta Silence, MD;  Location: [facility];  Service: Endoscopy;  Laterality: N/A;  . Robotic assisted lap vaginal hysterectomy N/A 11/19/2014    Procedure: ROBOTIC LYSIS OF ADHESIONS, CONVERTED TO LAPAROTOMY RADICAL UPPER VAGINECTOMY,LOW ANTERIOR BOWEL RESECTION, COLOSTOMY, BILATERAL URETERAL STENT PLACEMENT AND CYSTONOMY CLOSURE;  Surgeon: Everitt Amber, MD;  Location: [facility];  Service: Gynecology;  Laterality: N/A;  . Ostomy N/A 11/19/2014    Procedure: OSTOMY;  Surgeon: Michael [name], MD;  Location: [facility];  Service: General;  Laterality: N/A;  . Colostomy takedown N/A 12/04/2014    Procedure: LAPROSCOPIC LYSIS OF ADHESIONS, SPLENIC MOBILIZATION, RELOCATION OF COLOSTOMY, DEBRIDEMENT INITIAL COLOSTOMY SITE;  Surgeon: Michael [name], MD;  [facility]: [facility];  Service: General;  Laterality: N/A;  . Cystoscopy with retrograde pyelogram, ureteroscopy and stent placement Right 03/20/2015    Procedure: CYSTOSCOPY WITH RETROGRADE PYELOGRAM, URETEROSCOPY WITH BALLOON DILATION AND STENT PLACEMENT ON RIGHT;  Surgeon: Alexis Frock, MD;  Location:  [place];  Service: Urology;  Laterality: Right;  . Excision soft tissue mass right foreman  12-08-2006  . Abdominal hysterectomy  2006    staging for Ovarian cancer  . Laparoscopic cholecystectomy    . Cystoscopy with retrograde pyelogram, ureteroscopy and stent placement Right 05/02/2015    Procedure: CYSTOSCOPY WITH RIGHT RETROGRADE PYELOGRAM,  DIAGNOSTIC URETEROSCOPY AND STENT PULL ;  Surgeon: Alexis Frock, MD;  Location: [facility];  Service: Urology;  Laterality: Right;  . Cystoscopy with retrograde pyelogram, ureteroscopy and stent  placement Right 09/05/2015    Procedure: CYSTOSCOPY WITH RETROGRADE PYELOGRAM,  AND STENT PLACEMENT;  Surgeon: Alexis Frock, MD;  [facility];  Service: Urology;  Laterality: Right;  . Cystoscopy w/ retrogrades Right 11/21/2015    Procedure: CYSTOSCOPY WITH RETROGRADE PYELOGRAM;  Surgeon: Alexis Frock, MD;  [facility];  Service: Urology;  Laterality: Right;  . Cystoscopy w/ ureteral stent placement Right 11/21/2015    Procedure: CYSTOSCOPY WITH STENT REPLACEMENT;  Surgeon: Alexis Frock, MD;  [facility];  Service: Urology;  Laterality: Right;  . Mastectomy Right 12/11/2015    PROPHYLACTIC   . Mastectomy complete / simple w/ sentinel node biopsy Left 12/11/2015    AXILLARY SENTINEL LYMPH NODE BIOPSY   . Mastectomy w/ sentinel node biopsy Bilateral 12/11/2015    Procedure: RIGHT PROPHYLACTIC MASTECTOMY, LEFT TOTAL MASTECTOMY WITH LEFT AXILLARY SENTINEL LYMPH NODE BIOPSY;  Surgeon: Stark Klein, MD;  Location: [place];  Service: General;  Laterality: Bilateral;  . Breast reconstruction with placement of tissue expander and flex hd (acellular hydrated dermis) Bilateral 12/11/2015    Procedure: BILATERAL BREAST RECONSTRUCTION WITH PLACEMENT OF TISSUE EXPANDERS;  Surgeon: Irene Limbo, MD;  Location: [place];  Service: Plastics;  Laterality: Bilateral;  . Incision and drainage of wound Bilateral 12/26/2015    Procedure: DEBRIDEMENT OF BILATERAL MASTECTOMY FLAPS;  Surgeon: Irene Limbo, MD;  Location: [place];  Service: Plastics;  Laterality: Bilateral;  . Tissue expander filling Bilateral 12/26/2015    Procedure: EXPANSION OF BILATERAL CHEST TISSUE EXPANDERS (60 mL- Right; 75 mL- Left);  Surgeon: Irene Limbo, MD;  Location: [place];  Service: Plastics;  Laterality: Bilateral;    There were no vitals filed for this visit.      Subjective Assessment - 01/19/16 1353    Subjective I was having trouble reaching up to do my hair. I have been  working on it but I still have some trouble. I had a bilateral mastectomy. I can't really lift and just this week I was able to lay on my left side. They took 2 sentinel nodes on the left.    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 Yes   Pain Score 2    Pain Location Arm   Pain Orientation Upper;Left   Pain Descriptors / Indicators Aching;Shooting;Sharp   Pain Type Surgical pain   Pain Onset More than a month ago   Pain Frequency Intermittent   Aggravating Factors  being very active   Pain Relieving Factors resting LUE in a certain position such as an armrest  [employer] PT Assessment - 01/19/16 0001    Assessment   Medical Diagnosis left breast cancer   Referring Provider [place]   Onset Date/Surgical Date 12/11/15   Hand Dominance Left   Prior Therapy none- pt had therapy to L shoulder for an injury 5-6 yrs ago   Precautions   Precautions Other (comment)  colostomy   Restrictions   Weight Bearing Restrictions No   Balance Screen   Has the patient fallen in the past 6 months No   Has the patient had a decrease in activity level because of a fear of falling?  No   Is the patient reluctant to leave their home because of a fear of falling?  No   Home Ecologist residence   Living Arrangements Spouse/significant other   Available Help at Discharge Family   Type of [place] to enter   Entrance Stairs-Number of Steps 3   Entrance Stairs-Rails Right   Home Layout Two level   Alternate Level Stairs-Number of Steps 14   Alternate Level Stairs-Rails Right   Home Equipment None   Prior  Function   Level of Independence Independent with basic ADLs  needs assist with vacuuming, reaching up, opening jars   Vocation Retired   Leisure pt states she was doing a lot of walking but since her surgeries she has not been as active   Charity fundraiser Status Within Functional Limits for tasks assessed   Observation/Other Assessments   Other Surveys  --  LLIS: 34% impairment   AROM   Right Shoulder Flexion 136 Degrees   Right Shoulder ABduction 99 Degrees   Right Shoulder Internal Rotation 76 Degrees   Right Shoulder External Rotation 78 Degrees  pt had pain with this motion   Left Shoulder Flexion 112 Degrees   Left Shoulder ABduction 85 Degrees   Left Shoulder Internal Rotation --  pt states that this is very painful so not measured.    Left Shoulder External Rotation --  pt states this is very painful so this was not measured.            LYMPHEDEMA/ONCOLOGY QUESTIONNAIRE - 01/19/16 1412    Type   Cancer Type left breast cancr   Surgeries   Mastectomy Date 12/11/15  bilateral   Sentinel Lymph Node Biopsy Date 12/11/15   Other Surgery Date --  pt also had stage 2 ovarian/endometrial cancer w/ surgery   Number Lymph Nodes Removed 2   Treatment   Active Chemotherapy Treatment No   Past Chemotherapy Treatment Yes   Date 11/30/04   Active Radiation Treatment No   Past Radiation Treatment Yes   Date 11/01/14   Body Site right lower abdominal wall for cancer recurrence   Current Hormone Treatment Yes   Date 01/31/16   Drug Name arimidex   Past Hormone Therapy No   What other symptoms do you have   Are you Having Heaviness or Tightness Yes  in abdomen   Are you having Pain Yes   Are you having pitting edema No   Is it Hard or Difficult finding clothes that fit No   Do you have infections No   Is there Decreased scar mobility --  pt has expanders   Lymphedema Assessments   Lymphedema Assessments Upper extremities   Right Upper Extremity  Lymphedema   15 cm Proximal to Olecranon Process 32.5 cm   Olecranon Process 26.5 cm  15 cm Proximal to Ulnar Styloid Process 25.4 cm   Just Proximal to Ulnar Styloid Process 16.1 cm   Across Hand at [employer] 18.4 cm   At [place] of 2nd Digit 5.8 cm   Left Upper Extremity Lymphedema   15 cm Proximal to Olecranon Process 33 cm   Olecranon Process 25.5 cm   15 cm Proximal to Ulnar Styloid Process 25.3 cm   Just Proximal to Ulnar Styloid Process 17 cm   Across Hand at [employer] 18.6 cm   At [place] of 2nd Digit 6 cm           Quick Dash - 01/19/16 0001    Open a tight or new jar Mild difficulty   Do heavy household chores (wash walls, wash floors) Moderate difficulty   Carry a shopping bag or briefcase Moderate difficulty   Wash your back Mild difficulty   Use a knife to cut food Mild difficulty   Recreational activities in which you take some force or impact through your arm, shoulder, or hand (golf, hammering, tennis) Moderate difficulty   During the past week, to what extent has your arm, shoulder or hand problem interfered with your normal social activities with family, friends, neighbors, or groups? Slightly   During the past week, to what extent has your arm, shoulder or hand problem limited your work or other regular daily activities Slightly   Arm, shoulder, or hand pain. Moderate   Tingling (pins and needles) in your arm, shoulder, or hand Mild   Difficulty Sleeping Moderate difficulty   DASH Score 36.36 %                          Short Term Clinic Goals - 01/19/16 1547    CC Short Term Goal  #1   Title Pt will be independent in verbalizing lymphedema risk reduction practices.    Time 4   Period Weeks   Status New   CC Short Term Goal  #2   Title Pt will demonstrate 40 degrees of L shoulder external rotation to allow pt to wash her hair.   Baseline unable to assess due to pain   Time 4   Period Weeks   Status New   CC Short Term Goal  #3    Title Pt to demonstrate 150 degrees of left shoulder flexion to allow her to reach items on shelves   Baseline 112   Time 4   Period Weeks   Status New   CC Short Term Goal  #4   Title Pt to demonstrate 120 degrees of left shoulder abduction to allow pt to reach items out to sides   Baseline 85   Time 4   Period Weeks   Status New             Long Term Clinic Goals - 01/19/16 1548    CC Long Term Goal  #1   Title Pt to be independent with a home exercise program for strengthening and stretching.    Time 8   Period Weeks   Status New   CC Long Term Goal  #2   Title Pt to demonstrate 70 degrees of L shoulder IR and ER rotation to allow her to complete ADLs without assist and to wash her hair   Baseline unable to assess due to pain   Time 8   Period Weeks   Status New   CC Long Term  Goal  #3   Title Pt to demonstrate 160 degrees of left and right shoulder flexion to allow her to reach items on shelves   Baseline R 136 deg, L 112 deg   Time 8   Period Weeks   Status New   CC Long Term Goal  #4   Title Pt to demonstrate 145 degrees of bilateral shoulder abduction to allow pt to reach items out to sides   Baseline R 99, L 85 deg   Time 8   Period Weeks   Status New            Plan - 01/19/16 1542    Clinical Impression Statement Pt presents to PT with decreased bilateral shoulder ROM following bilateral mastectomy for treatment of left breast cancer. She had a sentinel node dissection with 2 lymph nodes removed on L. She has expanders in place currently.  Pt has prior hx of cervical cancer that was treated with surgery, chemo and radiation. She currently does not demonstrate any edema in LUE. Pt is limited in flexion and abduction bilaterally. Was unable to assess left internal or external rotation secondary to pain. Pt would benefit from skilled PT services to be educated on lymphedema risk reduction practices and to increase bilateral shoulder ROM and strength.     Rehab Potential Good   Clinical Impairments Affecting Rehab Potential hx of radiation for cervical cancer   PT Frequency 2x / week   PT Duration 8 weeks   PT Treatment/Interventions Manual techniques;Therapeutic exercise;Therapeutic activities;Taping;Patient/family education;Passive range of motion;DME Instruction;Scar mobilization;ADLs/Self Care Home Management   PT Next Visit Plan begin PROM/AAROM/AROM and give dowel exercises in supine to add to HEP   Consulted and Agree with Plan of Care Patient      Patient will benefit from skilled therapeutic intervention in order to improve the following deficits and impairments:  Postural dysfunction, Decreased range of motion, Increased muscle spasms, Pain, Impaired UE functional use, Increased fascial restricitons, Decreased strength, Decreased knowledge of use of DME, Decreased knowledge of precautions, Decreased scar mobility  Visit Diagnosis: Stiffness of left shoulder, not elsewhere classified - Plan: PT plan of care cert/re-cert  Stiffness of right shoulder, not elsewhere classified - Plan: PT plan of care cert/re-cert  Pain in left shoulder - Plan: PT plan of care cert/re-cert  Pain in right shoulder - Plan: PT plan of care cert/re-cert     Problem List Patient Active Problem List   Diagnosis Date Noted  . Breast cancer, left ([place]) 12/11/2015  . Genetic testing 11/27/2015  . Cancer of corpus uteri, except isthmus ([place]) 11/13/2015  . Breast cancer of upper-inner quadrant of left female breast ([place])   . Dehydration 10/09/2015  . Acute bacterial bronchitis 06/04/2015  . Hydronephrosis, right 03/19/2015  . Pelvic pain in female 03/19/2015  . Rash, skin 03/06/2015  . Diarrhea 02/20/2015  . Fever 12/30/2014  . Sepsis ([place]) 12/30/2014  . UTI (lower urinary tract infection) 12/30/2014  . Diabetes mellitus type 2, controlled ([place]) 12/30/2014  . Chronic anemia 12/30/2014  . Urinary tract infectious disease   . Hypomagnesemia  12/11/2014  . Hypokalemia 12/11/2014  . Cellulitis of left abdominal wall near colostomy 12/03/2014  . Reactive thrombocytosis 11/29/2014  . Pancytopenia, acquired ([place]) 11/26/2014  . Acute respiratory failure with hypoxia ([place]) 11/20/2014  . Morbid obesity ([place]) 11/20/2014  . Endotracheally intubated   . 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 Abdominal pain 09/09/2014  .  Postmenopausal bleeding 09/05/2014  . History of ovarian & endometrial cancer 07/17/2012  . History of endometrial cancer 05/10/2012  . Malignant neoplasm of corpus uteri, except isthmus ([place]) 05/10/2012  . Malignant neoplasm of ovary ([place]) 05/10/2012  . Chronic neutropenia ([place]) 12/14/2011  . Hypothyroidism 12/14/2011  . DM type 2 (diabetes mellitus, type 2) ([place]) 12/14/2011  . Benign essential HTN 12/14/2011    Alexia Freestone 01/19/2016, 3:52 PM  [place], [place], [address] Phone: [phone]   Fax:  [phone]  Name: Elaysha Bevard MRN: [id number] Date of Birth: [birth year]    Allyson Sabal, PT 01/19/2016 3:52 PM

## 2016-01-21 ENCOUNTER — Ambulatory Visit: Payer: BLUE CROSS/BLUE SHIELD

## 2016-01-21 DIAGNOSIS — M25511 Pain in right shoulder: Secondary | ICD-10-CM

## 2016-01-21 DIAGNOSIS — M25611 Stiffness of right shoulder, not elsewhere classified: Secondary | ICD-10-CM

## 2016-01-21 DIAGNOSIS — M25612 Stiffness of left shoulder, not elsewhere classified: Secondary | ICD-10-CM

## 2016-01-21 DIAGNOSIS — M25512 Pain in left shoulder: Secondary | ICD-10-CM

## 2016-01-21 NOTE — Patient Instructions (Addendum)
SUPINE: Shoulder Flexion Bilateral (Cane)    Cancer Rehab [phone]    Lie on back with knees bent. Hold cane with both hands. Raise both arms overhead, keep elbows straight. _5-10__ reps per set, _1-2__ sets per day.   Cane Exercise: Abduction / Adduction    Hold cane palms down. Keeping back flat, move cane side to side over chest. Hold _5___ seconds each side. Repeat _5-10___ times. Do _1-2___ sessions per day.  http://gt2.exer.us/90   Copyright  VHI. All rights reserved.  Ball on [place]: Can roll ball up wall facing wall with slight forward lean at top of stretch. 10 times Then can face away from wall and have elbow/forearm on ball and roll ball up and lean into top of stretch 5-10 times.  Upper Limb Neural Tension: Median I    Stand with left palm flat on wall, fingers up and slowly rotate hand backwards until gentle stretch felt. Keeping elbow straight and hold __5__ seconds. Repeat _3___ times per set. Do _1___ sets per session. Do _1-2___ sessions per day.  http://orth.exer.us/403   Copyright  VHI. All rights reserved.

## 2016-01-21 NOTE — Therapy (Signed)
[place], [place], [address] Phone: [phone]   Fax:  [phone]  Physical Therapy Treatment  Patient Details  Name: [name] MRN: [id number] Date of Birth: 01/01/1956 Referring Provider: Barry Dienes  Encounter Date: 01/21/2016      PT End of Session - 01/21/16 1158    Visit Number 2   Number of Visits 17   Date for PT Re-Evaluation 03/15/16   PT Start Time 1059   PT Stop Time 1152   PT Time Calculation (min) 53 min   Activity Tolerance Patient tolerated treatment well   Behavior During Therapy [facility] for tasks assessed/performed      Past Medical History  Diagnosis Date  . Benign essential HTN 12/14/2011  . Multiple thyroid nodules     Managed by Dr. Harlow Asa  . Dysuria   . Radiation-induced dermatitis     contact dermatitis , radiation completed, rash only on ankles now.  . History of gastric polyp     2014  duodenum  . History of radiation therapy     01-29-2015 to 03-10-2015  pelvis 50.4Gy  . Hypothyroidism   . DM type 2 (diabetes mellitus, type 2) ([place])   . Hiatal hernia   . Anemia in neoplastic disease   . Chronic idiopathic neutropenia (HCC)     presumed related to chemotherapy March 2006--- followed by dr Alvy Bimler   . Ureteral stricture, right   . S/P colostomy ([place])   . Hydronephrosis   . History of renal stent   . Neutropenia ([place])   . Complication of anesthesia     PONV  . GERD (gastroesophageal reflux disease)     takes omeprazole  . Wears glasses   . Seasonal allergies   . History of bronchitis   . Cancer of corpus uteri, except isthmus [facility]) oncologist-- dr Denman [name] and dr Alvy Bimler     Mar 2006 dx endometrial and ovarian cancer s/p  chemotheapy and surgery  . Breast cancer, left ([place]) 2017  . PONV (postoperative nausea and vomiting)     "scopolamine patch works for me"    Past Surgical History  Procedure Laterality Date  . Appendectomy    . Tonsillectomy    . Eye  surgery      pytosis of eyelids-child  . Colonoscopy with propofol N/A 08/21/2013    Procedure: COLONOSCOPY WITH PROPOFOL;  Surgeon: Cleotis Nipper, MD;  Location: [facility];  Service: Endoscopy;  Laterality: N/A;  . Eus N/A 10/02/2014    Procedure: LOWER ENDOSCOPIC ULTRASOUND (EUS);  Surgeon: Arta Silence, MD;  Location: [facility];  Service: Endoscopy;  Laterality: N/A;  . Robotic assisted lap vaginal hysterectomy N/A 11/19/2014    Procedure: ROBOTIC LYSIS OF ADHESIONS, CONVERTED TO LAPAROTOMY RADICAL UPPER VAGINECTOMY,LOW ANTERIOR BOWEL RESECTION, COLOSTOMY, BILATERAL URETERAL STENT PLACEMENT AND CYSTONOMY CLOSURE;  Surgeon: Everitt Amber, MD;  Location: [facility];  Service: Gynecology;  Laterality: N/A;  . Ostomy N/A 11/19/2014    Procedure: OSTOMY;  Surgeon: Michael [name], MD;  Location: [facility];  Service: General;  Laterality: N/A;  . Colostomy takedown N/A 12/04/2014    Procedure: LAPROSCOPIC LYSIS OF ADHESIONS, SPLENIC MOBILIZATION, RELOCATION OF COLOSTOMY, DEBRIDEMENT INITIAL COLOSTOMY SITE;  Surgeon: Michael [name], MD;  Location: [facility];  Service: General;  Laterality: N/A;  . Cystoscopy with retrograde pyelogram, ureteroscopy and stent placement Right 03/20/2015    Procedure: CYSTOSCOPY WITH RETROGRADE PYELOGRAM, URETEROSCOPY WITH BALLOON DILATION AND STENT PLACEMENT ON RIGHT;  Surgeon: Alexis Frock, MD;  Location:  [place];  Service: Urology;  Laterality: Right;  . Excision soft tissue mass right foreman  12-08-2006  . Abdominal hysterectomy  2006    staging for Ovarian cancer  . Laparoscopic cholecystectomy    . Cystoscopy with retrograde pyelogram, ureteroscopy and stent placement Right 05/02/2015    Procedure: CYSTOSCOPY WITH RIGHT RETROGRADE PYELOGRAM,  DIAGNOSTIC URETEROSCOPY AND STENT PULL ;  Surgeon: Alexis Frock, MD;  Location: [facility];  Service: Urology;  Laterality: Right;  . Cystoscopy with retrograde pyelogram, ureteroscopy and stent  placement Right 09/05/2015    Procedure: CYSTOSCOPY WITH RETROGRADE PYELOGRAM,  AND STENT PLACEMENT;  Surgeon: Alexis Frock, MD;  [facility];  Service: Urology;  Laterality: Right;  . Cystoscopy w/ retrogrades Right 11/21/2015    Procedure: CYSTOSCOPY WITH RETROGRADE PYELOGRAM;  Surgeon: Alexis Frock, MD;  [facility];  Service: Urology;  Laterality: Right;  . Cystoscopy w/ ureteral stent placement Right 11/21/2015    Procedure: CYSTOSCOPY WITH STENT REPLACEMENT;  Surgeon: Alexis Frock, MD;  [facility];  Service: Urology;  Laterality: Right;  . Mastectomy Right 12/11/2015    PROPHYLACTIC   . Mastectomy complete / simple w/ sentinel node biopsy Left 12/11/2015    AXILLARY SENTINEL LYMPH NODE BIOPSY   . Mastectomy w/ sentinel node biopsy Bilateral 12/11/2015    Procedure: RIGHT PROPHYLACTIC MASTECTOMY, LEFT TOTAL MASTECTOMY WITH LEFT AXILLARY SENTINEL LYMPH NODE BIOPSY;  Surgeon: Stark Klein, MD;  Location: [place];  Service: General;  Laterality: Bilateral;  . Breast reconstruction with placement of tissue expander and flex hd (acellular hydrated dermis) Bilateral 12/11/2015    Procedure: BILATERAL BREAST RECONSTRUCTION WITH PLACEMENT OF TISSUE EXPANDERS;  Surgeon: Irene Limbo, MD;  Location: [place];  Service: Plastics;  Laterality: Bilateral;  . Incision and drainage of wound Bilateral 12/26/2015    Procedure: DEBRIDEMENT OF BILATERAL MASTECTOMY FLAPS;  Surgeon: Irene Limbo, MD;  Location: [place];  Service: Plastics;  Laterality: Bilateral;  . Tissue expander filling Bilateral 12/26/2015    Procedure: EXPANSION OF BILATERAL CHEST TISSUE EXPANDERS (60 mL- Right; 75 mL- Left);  Surgeon: Irene Limbo, MD;  Location: [place];  Service: Plastics;  Laterality: Bilateral;    There were no vitals filed for this visit.      Subjective Assessment - 01/21/16 1105    Subjective I had some swelling above my Rt breast and at my lateral trunk  after my fill last Wednesday but it was gone by the next morning. I wasn't sure why that happened bc that wasn't the side my SLND was. My bil shoulders are just uncomfortable today with some increased tightness running down my Lt arm.    Pertinent History Breast cancer of upper-inner quadrant of left female breast (HCC) Stage IIA (T2, N0, M0) ER,PR positive Her2 neg, s/p bilateral mastectomy, pt has hx of cervical cancer with recurrence and was treated with chemo and radiation, she is diabetic   Patient Stated Goals get full range of motion back, I have to ask my husband to help me open jars                         [facility] Adult PT Treatment/Exercise - 01/21/16 0001    Shoulder Exercises: Pulleys   Flexion 2 minutes   ABduction 2 minutes   ABduction Limitations VC throughout for correct technique   Shoulder Exercises: Therapy Ball   Flexion 10 reps   ABduction 5 reps  Bil  UE's, pt returned therapist deonmstration   Manual Therapy   Myofascial Release Bil UE pulling throughout P/ROM   Passive ROM In Supine: Bil shoulders into flexion, abduction, and D2 all to pts tolerance and slowly.                 PT Education - 01/21/16 1156    Education provided Yes   Education Details AA/ROM with supine or standing cane exercises, rolling ball up wall and neural tension stretch on wall.   Person(s) Educated Patient   Methods Explanation;Demonstration;Handout;Verbal cues   Comprehension Verbalized understanding;Returned demonstration;Need further instruction           Short Term Clinic Goals - 01/19/16 1547    CC Short Term Goal  #1   Title Pt will be independent in verbalizing lymphedema risk reduction practices.    Time 4   Period Weeks   Status New   CC Short Term Goal  #2   Title Pt will demonstrate 40 degrees of L shoulder external rotation to allow pt to wash her hair.   Baseline unable to assess due to pain   Time 4   Period Weeks   Status New   CC Short  Term Goal  #3   Title Pt to demonstrate 150 degrees of left shoulder flexion to allow her to reach items on shelves   Baseline 112   Time 4   Period Weeks   Status New   CC Short Term Goal  #4   Title Pt to demonstrate 120 degrees of left shoulder abduction to allow pt to reach items out to sides   Baseline 85   Time 4   Period Weeks   Status New             Long Term Clinic Goals - 01/19/16 1548    CC Long Term Goal  #1   Title Pt to be independent with a home exercise program for strengthening and stretching.    Time 8   Period Weeks   Status New   CC Long Term Goal  #2   Title Pt to demonstrate 70 degrees of L shoulder IR and ER rotation to allow her to complete ADLs without assist and to wash her hair   Baseline unable to assess due to pain   Time 8   Period Weeks   Status New   CC Long Term Goal  #3   Title Pt to demonstrate 160 degrees of left and right shoulder flexion to allow her to reach items on shelves   Baseline R 136 deg, L 112 deg   Time 8   Period Weeks   Status New   CC Long Term Goal  #4   Title Pt to demonstrate 145 degrees of bilateral shoulder abduction to allow pt to reach items out to sides   Baseline R 99, L 85 deg   Time 8   Period Weeks   Status New            Plan - 01/21/16 1159    Clinical Impression Statement Pt had trouble with technique of supine cane exercises, partially due to limited space in treatment room, so also instructed pt in rolling ball up wall into flexion and abduction which she did very well and reported feeling good stretches with that. She did well with P/ROM as well, just requiring minor VC to relax during stretching.    Rehab Potential Good   Clinical Impairments Affecting Rehab Potential hx  of radiation for cervical cancer   PT Frequency 2x / week   PT Duration 8 weeks   PT Treatment/Interventions Manual techniques;Therapeutic exercise;Therapeutic activities;Taping;Patient/family education;Passive range of  motion;DME Instruction;Scar mobilization;ADLs/Self Care Home Management   PT Next Visit Plan Continue PROM/AAROM/AROM and assess pts tolerance of session today; review HEP issued today.   PT Home Exercise Plan Rolling ball up wall; supine cane exercises on her bed at home with more room than in clinic; and neural tension stretch on wall   Consulted and Agree with Plan of Care Patient      Patient will benefit from skilled therapeutic intervention in order to improve the following deficits and impairments:  Postural dysfunction, Decreased range of motion, Increased muscle spasms, Pain, Impaired UE functional use, Increased fascial restricitons, Decreased strength, Decreased knowledge of use of DME, Decreased knowledge of precautions, Decreased scar mobility  Visit Diagnosis: Stiffness of left shoulder, not elsewhere classified  Stiffness of right shoulder, not elsewhere classified  Pain in left shoulder  Pain in right shoulder     Problem List Patient Active Problem List   Diagnosis Date Noted  . Breast cancer, left ([place]) 12/11/2015  . Genetic testing 11/27/2015  . Cancer of corpus uteri, except isthmus ([place]) 11/13/2015  . Breast cancer of upper-inner quadrant of left female breast ([place])   . Dehydration 10/09/2015  . Acute bacterial bronchitis 06/04/2015  . Hydronephrosis, right 03/19/2015  . Pelvic pain in female 03/19/2015  . Rash, skin 03/06/2015  . Diarrhea 02/20/2015  . Fever 12/30/2014  . Sepsis ([place]) 12/30/2014  . UTI (lower urinary tract infection) 12/30/2014  . Diabetes mellitus type 2, controlled ([place]) 12/30/2014  . Chronic anemia 12/30/2014  . Urinary tract infectious disease   . Hypomagnesemia 12/11/2014  . Hypokalemia 12/11/2014  . Cellulitis of left abdominal wall near colostomy 12/03/2014  . Reactive thrombocytosis 11/29/2014  . Pancytopenia, acquired ([place]) 11/26/2014  . Acute respiratory failure with hypoxia ([place]) 11/20/2014  . Morbid obesity ([place])  11/20/2014  . Endotracheally intubated   . 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 Abdominal pain 09/09/2014  . Postmenopausal bleeding 09/05/2014  . History of ovarian & endometrial cancer 07/17/2012  . History of endometrial cancer 05/10/2012  . Malignant neoplasm of corpus uteri, except isthmus ([place]) 05/10/2012  . Malignant neoplasm of ovary ([place]) 05/10/2012  . Chronic neutropenia ([place]) 12/14/2011  . Hypothyroidism 12/14/2011  . DM type 2 (diabetes mellitus, type 2) ([place]) 12/14/2011  . Benign essential HTN 12/14/2011    Otelia Limes, PTA 01/21/2016, 12:04 PM  [place], [place], [address] Phone: [phone]   Fax:  [phone]  Name: Faline Langer MRN: [id number] Date of Birth: 1955-08-18

## 2016-01-23 DIAGNOSIS — Z933 Colostomy status: Secondary | ICD-10-CM | POA: Diagnosis not present

## 2016-01-28 ENCOUNTER — Ambulatory Visit: Payer: BLUE CROSS/BLUE SHIELD | Admitting: Physical Therapy

## 2016-01-28 DIAGNOSIS — M25511 Pain in right shoulder: Secondary | ICD-10-CM

## 2016-01-28 DIAGNOSIS — M25611 Stiffness of right shoulder, not elsewhere classified: Secondary | ICD-10-CM | POA: Diagnosis not present

## 2016-01-28 DIAGNOSIS — M25512 Pain in left shoulder: Secondary | ICD-10-CM | POA: Diagnosis not present

## 2016-01-28 DIAGNOSIS — M25612 Stiffness of left shoulder, not elsewhere classified: Secondary | ICD-10-CM | POA: Diagnosis not present

## 2016-01-28 NOTE — Therapy (Signed)
[place], [place], [address] Phone: [phone]   Fax:  [phone]  Physical Therapy Treatment  Patient Details  Name: [name] MRN: [id number] Date of Birth: 1955-08-27 Referring Provider: Barry Dienes  Encounter Date: 01/28/2016      PT End of Session - 01/28/16 1246    Visit Number 3   Number of Visits 17   Date for PT Re-Evaluation 03/15/16   PT Start Time 1020   PT Stop Time 1110   PT Time Calculation (min) 50 min   Activity Tolerance Patient tolerated treatment well   Behavior During Therapy [facility] for tasks assessed/performed      Past Medical History  Diagnosis Date  . Benign essential HTN 12/14/2011  . Multiple thyroid nodules     Managed by Dr. Harlow Asa  . Dysuria   . Radiation-induced dermatitis     contact dermatitis , radiation completed, rash only on ankles now.  . History of gastric polyp     2014  duodenum  . History of radiation therapy     01-29-2015 to 03-10-2015  pelvis 50.4Gy  . Hypothyroidism   . DM type 2 (diabetes mellitus, type 2) ([place])   . Hiatal hernia   . Anemia in neoplastic disease   . Chronic idiopathic neutropenia (HCC)     presumed related to chemotherapy March 2006--- followed by dr Alvy Bimler   . Ureteral stricture, right   . S/P colostomy ([place])   . Hydronephrosis   . History of renal stent   . Neutropenia ([place])   . Complication of anesthesia     PONV  . GERD (gastroesophageal reflux disease)     takes omeprazole  . Wears glasses   . Seasonal allergies   . History of bronchitis   . Cancer of corpus uteri, except isthmus [facility]) oncologist-- dr Denman [name] and dr Alvy Bimler     Mar 2006 dx endometrial and ovarian cancer s/p  chemotheapy and surgery  . Breast cancer, left ([place]) 2017  . PONV (postoperative nausea and vomiting)     "scopolamine patch works for me"    Past Surgical History  Procedure Laterality Date  . Appendectomy    . Tonsillectomy    . Eye  surgery      pytosis of eyelids-child  . Colonoscopy with propofol N/A 08/21/2013    Procedure: COLONOSCOPY WITH PROPOFOL;  Surgeon: Cleotis Nipper, MD;  [facility];  Service: Endoscopy;  Laterality: N/A;  . Eus N/A 10/02/2014    Procedure: LOWER ENDOSCOPIC ULTRASOUND (EUS);  Surgeon: Arta Silence, MD;  Location: [facility];  Service: Endoscopy;  Laterality: N/A;  . Robotic assisted lap vaginal hysterectomy N/A 11/19/2014    Procedure: ROBOTIC LYSIS OF ADHESIONS, CONVERTED TO LAPAROTOMY RADICAL UPPER VAGINECTOMY,LOW ANTERIOR BOWEL RESECTION, COLOSTOMY, BILATERAL URETERAL STENT PLACEMENT AND CYSTONOMY CLOSURE;  Surgeon: Everitt Amber, MD;  [facility];  Service: Gynecology;  Laterality: N/A;  . Ostomy N/A 11/19/2014    Procedure: OSTOMY;  Surgeon: Michael [name], MD;  [facility];  Service: General;  Laterality: N/A;  . Colostomy takedown N/A 12/04/2014    Procedure: LAPROSCOPIC LYSIS OF ADHESIONS, SPLENIC MOBILIZATION, RELOCATION OF COLOSTOMY, DEBRIDEMENT INITIAL COLOSTOMY SITE;  Surgeon: Michael [name], MD;  [facility];  Service: General;  Laterality: N/A;  . Cystoscopy with retrograde pyelogram, ureteroscopy and stent placement Right 03/20/2015    Procedure: CYSTOSCOPY WITH RETROGRADE PYELOGRAM, URETEROSCOPY WITH BALLOON DILATION AND STENT PLACEMENT ON RIGHT;  Surgeon: Alexis Frock, MD;  Location:  [place];  Service: Urology;  Laterality: Right;  . Excision soft tissue mass right foreman  12-08-2006  . Abdominal hysterectomy  2006    staging for Ovarian cancer  . Laparoscopic cholecystectomy    . Cystoscopy with retrograde pyelogram, ureteroscopy and stent placement Right 05/02/2015    Procedure: CYSTOSCOPY WITH RIGHT RETROGRADE PYELOGRAM,  DIAGNOSTIC URETEROSCOPY AND STENT PULL ;  Surgeon: Alexis Frock, MD;  Location: [facility];  Service: Urology;  Laterality: Right;  . Cystoscopy with retrograde pyelogram, ureteroscopy and stent  placement Right 09/05/2015    Procedure: CYSTOSCOPY WITH RETROGRADE PYELOGRAM,  AND STENT PLACEMENT;  Surgeon: Alexis Frock, MD;  [facility];  Service: Urology;  Laterality: Right;  . Cystoscopy w/ retrogrades Right 11/21/2015    Procedure: CYSTOSCOPY WITH RETROGRADE PYELOGRAM;  Surgeon: Alexis Frock, MD;  [facility];  Service: Urology;  Laterality: Right;  . Cystoscopy w/ ureteral stent placement Right 11/21/2015    Procedure: CYSTOSCOPY WITH STENT REPLACEMENT;  Surgeon: Alexis Frock, MD;  [facility];  Service: Urology;  Laterality: Right;  . Mastectomy Right 12/11/2015    PROPHYLACTIC   . Mastectomy complete / simple w/ sentinel node biopsy Left 12/11/2015    AXILLARY SENTINEL LYMPH NODE BIOPSY   . Mastectomy w/ sentinel node biopsy Bilateral 12/11/2015    Procedure: RIGHT PROPHYLACTIC MASTECTOMY, LEFT TOTAL MASTECTOMY WITH LEFT AXILLARY SENTINEL LYMPH NODE BIOPSY;  Surgeon: Stark Klein, MD;  Location: [place];  Service: General;  Laterality: Bilateral;  . Breast reconstruction with placement of tissue expander and flex hd (acellular hydrated dermis) Bilateral 12/11/2015    Procedure: BILATERAL BREAST RECONSTRUCTION WITH PLACEMENT OF TISSUE EXPANDERS;  Surgeon: Irene Limbo, MD;  Location: [facility];  Service: Plastics;  Laterality: Bilateral;  . Incision and drainage of wound Bilateral 12/26/2015    Procedure: DEBRIDEMENT OF BILATERAL MASTECTOMY FLAPS;  Surgeon: Irene Limbo, MD;  Location: [place];  Service: Plastics;  Laterality: Bilateral;  . Tissue expander filling Bilateral 12/26/2015    Procedure: EXPANSION OF BILATERAL CHEST TISSUE EXPANDERS (60 mL- Right; 75 mL- Left);  Surgeon: Irene Limbo, MD;  Location: [place];  Service: Plastics;  Laterality: Bilateral;    There were no vitals filed for this visit.      Subjective Assessment - 01/28/16 1025    Subjective Did pretty welll with last session.  It took me a while to get  the hang of that pole side to side.  Tried a broomstick but ended up using a walking cane.   Currently in Pain? Yes   Pain Score 3    Pain Location Back   Pain Orientation Lower   Pain Descriptors / Indicators Aching;Dull;Pressure   Aggravating Factors  related to abdominal surgeries and eating   Pain Relieving Factors having a bowel movement            [facility] PT Assessment - 01/28/16 0001    AROM   Right Shoulder Flexion 123 Degrees   Right Shoulder ABduction 131 Degrees  just slight scaption   Left Shoulder Flexion 125 Degrees   Left Shoulder ABduction 106 Degrees                     [facility] Adult PT Treatment/Exercise - 01/28/16 0001    Shoulder Exercises: Supine   Horizontal ABduction AAROM;Right;Left;5 reps  with dowel   Flexion AAROM;Both;5 reps  with dowel   Shoulder Exercises: Seated   Other Seated Exercises backward shoulder  rolls at end of session   Shoulder Exercises: Pulleys   Flexion 2 minutes   ABduction 2 minutes   Shoulder Exercises: Therapy Ball   Flexion 10 reps   ABduction 5 reps  Bil UE's, pt returned therapist deonmstration   Manual Therapy   Manual therapy comments     Myofascial Release across left axilla with arm in flexion; myofascial pulling of each UE   Passive ROM In supine with stretch to tolerance for ER, abduction, and flexion to right and left shoulders   Prosthetics   Current prosthetic wear tolerance (#hours/day)                      Short Term Clinic Goals - 01/28/16 1249    CC Short Term Goal  #1   Title Pt will be independent in verbalizing lymphedema risk reduction practices.    Status On-going   CC Short Term Goal  #2   Title Pt will demonstrate 40 degrees of L shoulder external rotation to allow pt to wash her hair.   Status On-going   CC Short Term Goal  #3   Title Pt to demonstrate 150 degrees of left shoulder flexion to allow her to reach items on shelves   Status On-going   CC Short Term Goal  #4    Title Pt to demonstrate 120 degrees of left shoulder abduction to allow pt to reach items out to sides   Status On-going             [place] - 01/19/16 1548    [place] Term Goal  #1   Title Pt to be independent with a home exercise program for strengthening and stretching.    Time 8   Period Weeks   Status New   CC Long Term Goal  #2   Title Pt to demonstrate 70 degrees of L shoulder IR and ER rotation to allow her to complete ADLs without assist and to wash her hair   Baseline unable to assess due to pain   Time 8   Period Weeks   Status New   CC Long Term Goal  #3   Title Pt to demonstrate 160 degrees of left and right shoulder flexion to allow her to reach items on shelves   Baseline R 136 deg, L 112 deg   Time 8   Period Weeks   Status New   CC Long Term Goal  #4   Title Pt to demonstrate 145 degrees of bilateral shoulder abduction to allow pt to reach items out to sides   Baseline R 99, L 85 deg   Time 8   Period Weeks   Status New            Plan - 01/28/16 1246    Clinical Impression Statement Patient shows improved AROM in 3 of 4 motions measured today.  We reviewed the home program instructed last time and she did satisfactorily with that today.     Rehab Potential Good   Clinical Impairments Affecting Rehab Potential hx of radiation for cervical cancer   PT Frequency 2x / week   PT Duration 8 weeks   PT Treatment/Interventions Manual techniques;Therapeutic exercise;Therapeutic activities;Taping;Patient/family education;Passive range of motion;DME Instruction;Scar mobilization;ADLs/Self Care Home Management   PT Next Visit Plan Continue P/AA/A/ROM of both shoulders.   PT Home Exercise Plan Rolling ball up wall; supine cane exercises on her bed at home with more room than in clinic;  and neural tension stretch on wall   Consulted and Agree with Plan of Care Patient      Patient will benefit from skilled therapeutic intervention in order  to improve the following deficits and impairments:  Postural dysfunction, Decreased range of motion, Increased muscle spasms, Pain, Impaired UE functional use, Increased fascial restricitons, Decreased strength, Decreased knowledge of use of DME, Decreased knowledge of precautions, Decreased scar mobility  Visit Diagnosis: Stiffness of left shoulder, not elsewhere classified  Stiffness of right shoulder, not elsewhere classified  Pain in left shoulder  Pain in right shoulder     Problem List Patient Active Problem List   Diagnosis Date Noted  . Breast cancer, left ([place]) 12/11/2015  . Genetic testing 11/27/2015  . Cancer of corpus uteri, except isthmus ([place]) 11/13/2015  . Breast cancer of upper-inner quadrant of left female breast ([place])   . Dehydration 10/09/2015  . Acute bacterial bronchitis 06/04/2015  . Hydronephrosis, right 03/19/2015  . Pelvic pain in female 03/19/2015  . Rash, skin 03/06/2015  . Diarrhea 02/20/2015  . Fever 12/30/2014  . Sepsis ([place]) 12/30/2014  . UTI (lower urinary tract infection) 12/30/2014  . Diabetes mellitus type 2, controlled ([place]) 12/30/2014  . Chronic anemia 12/30/2014  . Urinary tract infectious disease   . Hypomagnesemia 12/11/2014  . Hypokalemia 12/11/2014  . Cellulitis of left abdominal wall near colostomy 12/03/2014  . Reactive thrombocytosis 11/29/2014  . Pancytopenia, acquired ([place]) 11/26/2014  . Acute respiratory failure with hypoxia ([place]) 11/20/2014  . Morbid obesity ([place]) 11/20/2014  . Endotracheally intubated   . 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 Abdominal pain 09/09/2014  . Postmenopausal bleeding 09/05/2014  . History of ovarian & endometrial cancer 07/17/2012  . History of endometrial cancer 05/10/2012  . Malignant neoplasm of corpus uteri, except isthmus ([place]) 05/10/2012  . Malignant neoplasm of ovary ([place]) 05/10/2012  . Chronic  neutropenia ([place]) 12/14/2011  . Hypothyroidism 12/14/2011  . DM type 2 (diabetes mellitus, type 2) ([place]) 12/14/2011  . Benign essential HTN 12/14/2011    [name] 01/28/2016, 12:50 PM  [place], [place], [address] Phone: [phone]   Fax:  [phone]  Name: [name] MRN: [id number] Date of Birth: 1955/09/27    Serafina Royals, PT 01/28/2016 12:50 PM

## 2016-01-30 ENCOUNTER — Ambulatory Visit: Payer: BLUE CROSS/BLUE SHIELD | Admitting: Physical Therapy

## 2016-01-30 ENCOUNTER — Encounter: Payer: Self-pay | Admitting: Physical Therapy

## 2016-01-30 DIAGNOSIS — M25511 Pain in right shoulder: Secondary | ICD-10-CM

## 2016-01-30 DIAGNOSIS — M25612 Stiffness of left shoulder, not elsewhere classified: Secondary | ICD-10-CM | POA: Diagnosis not present

## 2016-01-30 DIAGNOSIS — M25512 Pain in left shoulder: Secondary | ICD-10-CM

## 2016-01-30 DIAGNOSIS — M25611 Stiffness of right shoulder, not elsewhere classified: Secondary | ICD-10-CM | POA: Diagnosis not present

## 2016-01-30 NOTE — Therapy (Signed)
[place], [place], [address] Phone: [phone]   Fax:  [phone]  Physical Therapy Treatment  Patient Details  Name: [name] MRN: [id number] Date of Birth: 1956/03/09 Referring Provider: Barry Dienes  Encounter Date: 01/30/2016      PT End of Session - 01/30/16 1200    Visit Number 4   Number of Visits 17   Date for PT Re-Evaluation 03/15/16   PT Start Time 1020   PT Stop Time 1105   PT Time Calculation (min) 45 min   Activity Tolerance Patient tolerated treatment well   Behavior During Therapy [facility] for tasks assessed/performed      Past Medical History  Diagnosis Date  . Benign essential HTN 12/14/2011  . Multiple thyroid nodules     Managed by Dr. Harlow Asa  . Dysuria   . Radiation-induced dermatitis     contact dermatitis , radiation completed, rash only on ankles now.  . History of gastric polyp     2014  duodenum  . History of radiation therapy     01-29-2015 to 03-10-2015  pelvis 50.4Gy  . Hypothyroidism   . DM type 2 (diabetes mellitus, type 2) ([place])   . Hiatal hernia   . Anemia in neoplastic disease   . Chronic idiopathic neutropenia (HCC)     presumed related to chemotherapy March 2006--- followed by dr Alvy Bimler   . Ureteral stricture, right   . S/P colostomy ([place])   . Hydronephrosis   . History of renal stent   . Neutropenia ([place])   . Complication of anesthesia     PONV  . GERD (gastroesophageal reflux disease)     takes omeprazole  . Wears glasses   . Seasonal allergies   . History of bronchitis   . Cancer of corpus uteri, except isthmus [facility]) oncologist-- dr Denman [name] and dr Alvy Bimler     Mar 2006 dx endometrial and ovarian cancer s/p  chemotheapy and surgery  . Breast cancer, left ([place]) 2017  . PONV (postoperative nausea and vomiting)     "scopolamine patch works for me"    Past Surgical History  Procedure Laterality Date  . Appendectomy    . Tonsillectomy    . Eye  surgery      pytosis of eyelids-child  . Colonoscopy with propofol N/A 08/21/2013    Procedure: COLONOSCOPY WITH PROPOFOL;  Surgeon: Cleotis Nipper, MD;  [facility];  Service: Endoscopy;  Laterality: N/A;  . Eus N/A 10/02/2014    Procedure: LOWER ENDOSCOPIC ULTRASOUND (EUS);  Surgeon: Arta Silence, MD;  Location[facility];  Service: Endoscopy;  Laterality: N/A;  . Robotic assisted lap vaginal hysterectomy N/A 11/19/2014    Procedure: ROBOTIC LYSIS OF ADHESIONS, CONVERTED TO LAPAROTOMY RADICAL UPPER VAGINECTOMY,LOW ANTERIOR BOWEL RESECTION, COLOSTOMY, BILATERAL URETERAL STENT PLACEMENT AND CYSTONOMY CLOSURE;  Surgeon: Everitt Amber, MD;  [facility];  Service: Gynecology;  Laterality: N/A;  . Ostomy N/A 11/19/2014    Procedure: OSTOMY;  Surgeon: Michael [name], MD;  [facility];  Service: General;  Laterality: N/A;  . Colostomy takedown N/A 12/04/2014    Procedure: LAPROSCOPIC LYSIS OF ADHESIONS, SPLENIC MOBILIZATION, RELOCATION OF COLOSTOMY, DEBRIDEMENT INITIAL COLOSTOMY SITE;  Surgeon: Michael [name], MD;  [facility];  Service: General;  Laterality: N/A;  . Cystoscopy with retrograde pyelogram, ureteroscopy and stent placement Right 03/20/2015    Procedure: CYSTOSCOPY WITH RETROGRADE PYELOGRAM, URETEROSCOPY WITH BALLOON DILATION AND STENT PLACEMENT ON RIGHT;  Surgeon: Alexis Frock, MD;  Location:  [place];  Service: Urology;  Laterality: Right;  . Excision soft tissue mass right foreman  12-08-2006  . Abdominal hysterectomy  2006    staging for Ovarian cancer  . Laparoscopic cholecystectomy    . Cystoscopy with retrograde pyelogram, ureteroscopy and stent placement Right 05/02/2015    Procedure: CYSTOSCOPY WITH RIGHT RETROGRADE PYELOGRAM,  DIAGNOSTIC URETEROSCOPY AND STENT PULL ;  Surgeon: Alexis Frock, MD;  Location: [facility];  Service: Urology;  Laterality: Right;  . Cystoscopy with retrograde pyelogram, ureteroscopy and stent  placement Right 09/05/2015    Procedure: CYSTOSCOPY WITH RETROGRADE PYELOGRAM,  AND STENT PLACEMENT;  Surgeon: Alexis Frock, MD;  [facility];  Service: Urology;  Laterality: Right;  . Cystoscopy w/ retrogrades Right 11/21/2015    Procedure: CYSTOSCOPY WITH RETROGRADE PYELOGRAM;  Surgeon: Alexis Frock, MD;  [facility];  Service: Urology;  Laterality: Right;  . Cystoscopy w/ ureteral stent placement Right 11/21/2015    Procedure: CYSTOSCOPY WITH STENT REPLACEMENT;  Surgeon: Alexis Frock, MD;  [facility];  Service: Urology;  Laterality: Right;  . Mastectomy Right 12/11/2015    PROPHYLACTIC   . Mastectomy complete / simple w/ sentinel node biopsy Left 12/11/2015    AXILLARY SENTINEL LYMPH NODE BIOPSY   . Mastectomy w/ sentinel node biopsy Bilateral 12/11/2015    Procedure: RIGHT PROPHYLACTIC MASTECTOMY, LEFT TOTAL MASTECTOMY WITH LEFT AXILLARY SENTINEL LYMPH NODE BIOPSY;  Surgeon: Stark Klein, MD;  Location: [place];  Service: General;  Laterality: Bilateral;  . Breast reconstruction with placement of tissue expander and flex hd (acellular hydrated dermis) Bilateral 12/11/2015    Procedure: BILATERAL BREAST RECONSTRUCTION WITH PLACEMENT OF TISSUE EXPANDERS;  Surgeon: Irene Limbo, MD;  Location: [place];  Service: Plastics;  Laterality: Bilateral;  . Incision and drainage of wound Bilateral 12/26/2015    Procedure: DEBRIDEMENT OF BILATERAL MASTECTOMY FLAPS;  Surgeon: Irene Limbo, MD;  Location: [place];  Service: Plastics;  Laterality: Bilateral;  . Tissue expander filling Bilateral 12/26/2015    Procedure: EXPANSION OF BILATERAL CHEST TISSUE EXPANDERS (60 mL- Right; 75 mL- Left);  Surgeon: Irene Limbo, MD;  Location: [place];  Service: Plastics;  Laterality: Bilateral;    There were no vitals filed for this visit.      Subjective Assessment - 01/30/16 1019    Subjective I had to figure out a different pole to use with my exercises. I  found a cane that was much easier to manage than the pole.    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 No/denies   Pain Score 0-No pain                         [facility] Adult PT Treatment/Exercise - 01/30/16 0001    Shoulder Exercises: Seated   Other Seated Exercises backward shoulder rolls x 10   Shoulder Exercises: Pulleys   Flexion 2 minutes   ABduction 2 minutes   Shoulder Exercises: Therapy Ball   Flexion 10 reps   ABduction 10 reps  Bil UE's   Manual Therapy   Myofascial Release across left axilla  with arm in flexion; myofascial pulling of each UE   Passive ROM In supine with stretch to tolerance for ER, abduction, and flexion to right and left shoulders                   Short Term Clinic Goals - 01/28/16 1249    CC Short Term Goal  #1   Title Pt will be independent in verbalizing lymphedema risk reduction practices.    Status On-going   CC Short Term Goal  #2   Title Pt will demonstrate 40 degrees of L shoulder external rotation to allow pt to wash her hair.   Status On-going   CC Short Term Goal  #3   Title Pt to demonstrate 150 degrees of left shoulder flexion to allow her to reach items on shelves   Status On-going   CC Short Term Goal  #4   Title Pt to demonstrate 120 degrees of left shoulder abduction to allow pt to reach items out to sides   Status On-going             [place] - 01/19/16 1548    [place] Term Goal  #1   Title Pt to be independent with a home exercise program for strengthening and stretching.    Time 8   Period Weeks   Status New   CC Long Term Goal  #2   Title Pt  to demonstrate 70 degrees of L shoulder IR and ER rotation to allow her to complete ADLs without assist and to wash her hair   Baseline unable to assess due to pain   Time 8   Period Weeks   Status New   CC Long Term Goal  #3   Title Pt to demonstrate 160 degrees of left and right shoulder flexion to allow her to reach items on shelves   Baseline R 136 deg, L 112 deg   Time 8   Period Weeks   Status New   CC Long Term Goal  #4   Title Pt to demonstrate 145 degrees of bilateral shoulder abduction to allow pt to reach items out to sides   Baseline R 99, L 85 deg   Time 8   Period Weeks   Status New            Plan - 01/30/16 1201    Clinical Impression Statement Pt has tightness in bilateral axilla which limit range of motion during PROM. She tolerated all exercises today very well. Pt would benefit from continued myofascial release.    Rehab Potential Good   Clinical Impairments Affecting Rehab Potential hx of radiation for cervical cancer   PT Frequency 2x / week   PT Duration 8 weeks   PT Treatment/Interventions Manual techniques;Therapeutic exercise;Therapeutic activities;Taping;Patient/family education;Passive range of motion;DME Instruction;Scar mobilization;ADLs/Self Care Home Management   PT Next Visit Plan Continue P/AA/A/ROM of both shoulders.   PT Home Exercise Plan Rolling ball up wall; supine cane exercises on her bed at home with more room than in clinic; and neural tension stretch on wall   Consulted and Agree with Plan of Care Patient      Patient will benefit from skilled therapeutic intervention in order to improve the following deficits and impairments:  Postural dysfunction, Decreased range of motion, Increased muscle spasms, Pain, Impaired UE functional use, Increased fascial restricitons, Decreased strength, Decreased knowledge of use of DME, Decreased knowledge of precautions, Decreased scar mobility  Visit Diagnosis: Stiffness of  left shoulder, not  elsewhere classified  Stiffness of right shoulder, not elsewhere classified  Pain in left shoulder  Pain in right shoulder     Problem List Patient Active Problem List   Diagnosis Date Noted  . Breast cancer, left ([place]) 12/11/2015  . Genetic testing 11/27/2015  . Cancer of corpus uteri, except isthmus ([place]) 11/13/2015  . Breast cancer of upper-inner quadrant of left female breast ([place])   . Dehydration 10/09/2015  . Acute bacterial bronchitis 06/04/2015  . Hydronephrosis, right 03/19/2015  . Pelvic pain in female 03/19/2015  . Rash, skin 03/06/2015  . Diarrhea 02/20/2015  . Fever 12/30/2014  . Sepsis ([place]) 12/30/2014  . UTI (lower urinary tract infection) 12/30/2014  . Diabetes mellitus type 2, controlled ([place]) 12/30/2014  . Chronic anemia 12/30/2014  . Urinary tract infectious disease   . Hypomagnesemia 12/11/2014  . Hypokalemia 12/11/2014  . Cellulitis of left abdominal wall near colostomy 12/03/2014  . Reactive thrombocytosis 11/29/2014  . Pancytopenia, acquired ([place]) 11/26/2014  . Acute respiratory failure with hypoxia ([place]) 11/20/2014  . Morbid obesity ([place]) 11/20/2014  . Endotracheally intubated   . 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 Abdominal pain 09/09/2014  . Postmenopausal bleeding 09/05/2014  . History of ovarian & endometrial cancer 07/17/2012  . History of endometrial cancer 05/10/2012  . Malignant neoplasm of corpus uteri, except isthmus ([place]) 05/10/2012  . Malignant neoplasm of ovary ([place]) 05/10/2012  . Chronic neutropenia ([place]) 12/14/2011  . Hypothyroidism 12/14/2011  . DM type 2 (diabetes mellitus, type 2) ([place]) 12/14/2011  . Benign essential HTN 12/14/2011    Alexia Freestone 01/30/2016, 12:04 PM  [place], [place], [address] Phone: [phone]   Fax:  [phone]  Name:  [name] MRN: [id number] Date of Birth: 06-Jan-1956    Allyson Sabal, PT 01/30/2016 12:04 PM

## 2016-02-04 ENCOUNTER — Ambulatory Visit: Payer: BLUE CROSS/BLUE SHIELD | Attending: General Surgery

## 2016-02-04 DIAGNOSIS — M25511 Pain in right shoulder: Secondary | ICD-10-CM | POA: Insufficient documentation

## 2016-02-04 DIAGNOSIS — M25512 Pain in left shoulder: Secondary | ICD-10-CM | POA: Diagnosis not present

## 2016-02-04 DIAGNOSIS — M25612 Stiffness of left shoulder, not elsewhere classified: Secondary | ICD-10-CM | POA: Diagnosis not present

## 2016-02-04 DIAGNOSIS — M25611 Stiffness of right shoulder, not elsewhere classified: Secondary | ICD-10-CM | POA: Insufficient documentation

## 2016-02-04 NOTE — Therapy (Signed)
[place], [place], [address] Phone: [phone]   Fax:  [phone]  Physical Therapy Treatment  Patient Details  Name: [name] MRN: [id number] Date of Birth: [birth year] Referring Provider: Barry Dienes  Encounter Date: 02/04/2016      PT End of Session - 02/04/16 1017    Visit Number 5   Number of Visits 17   Date for PT Re-Evaluation 03/15/16   PT Start Time 0938   PT Stop Time 1018   PT Time Calculation (min) 40 min   Activity Tolerance Patient tolerated treatment well   Behavior During Therapy [facility] for tasks assessed/performed      Past Medical History  Diagnosis Date  . Benign essential HTN 12/14/2011  . Multiple thyroid nodules     Managed by Dr. Harlow Asa  . Dysuria   . Radiation-induced dermatitis     contact dermatitis , radiation completed, rash only on ankles now.  . History of gastric polyp     2014  duodenum  . History of radiation therapy     01-29-2015 to 03-10-2015  pelvis 50.4Gy  . Hypothyroidism   . DM type 2 (diabetes mellitus, type 2) ([place])   . Hiatal hernia   . Anemia in neoplastic disease   . Chronic idiopathic neutropenia (HCC)     presumed related to chemotherapy March 2006--- followed by dr Alvy Bimler   . Ureteral stricture, right   . S/P colostomy ([place])   . Hydronephrosis   . History of renal stent   . Neutropenia ([place])   . Complication of anesthesia     PONV  . GERD (gastroesophageal reflux disease)     takes omeprazole  . Wears glasses   . Seasonal allergies   . History of bronchitis   . Cancer of corpus uteri, except isthmus [facility]) oncologist-- dr Denman [name] and dr Alvy Bimler     Mar 2006 dx endometrial and ovarian cancer s/p  chemotheapy and surgery  . Breast cancer, left ([place]) 2017  . PONV (postoperative nausea and vomiting)     "scopolamine patch works for me"    Past Surgical History  Procedure Laterality Date  . Appendectomy    . Tonsillectomy    . Eye  surgery      pytosis of eyelids-child  . Colonoscopy with propofol N/A 08/21/2013    Procedure: COLONOSCOPY WITH PROPOFOL;  Surgeon: Cleotis Nipper, MD;  [facility];  Service: Endoscopy;  Laterality: N/A;  . Eus N/A 10/02/2014    Procedure: LOWER ENDOSCOPIC ULTRASOUND (EUS);  Surgeon: Arta Silence, MD;  [facility];  Service: Endoscopy;  Laterality: N/A;  . Robotic assisted lap vaginal hysterectomy N/A 11/19/2014    Procedure: ROBOTIC LYSIS OF ADHESIONS, CONVERTED TO LAPAROTOMY RADICAL UPPER VAGINECTOMY,LOW ANTERIOR BOWEL RESECTION, COLOSTOMY, BILATERAL URETERAL STENT PLACEMENT AND CYSTONOMY CLOSURE;  Surgeon: Everitt Amber, MD;  [facility];  Service: Gynecology;  Laterality: N/A;  . Ostomy N/A 11/19/2014    Procedure: OSTOMY;  Surgeon: Michael [name], MD;  [facility];  Service: General;  Laterality: N/A;  . Colostomy takedown N/A 12/04/2014    Procedure: LAPROSCOPIC LYSIS OF ADHESIONS, SPLENIC MOBILIZATION, RELOCATION OF COLOSTOMY, DEBRIDEMENT INITIAL COLOSTOMY SITE;  Surgeon: Michael [name], MD;  [facility];  Service: General;  Laterality: N/A;  . Cystoscopy with retrograde pyelogram, ureteroscopy and stent placement Right 03/20/2015    Procedure: CYSTOSCOPY WITH RETROGRADE PYELOGRAM, URETEROSCOPY WITH BALLOON DILATION AND STENT PLACEMENT ON RIGHT;  Surgeon: Alexis Frock, MD;  Location:  [place];  Service: Urology;  Laterality: Right;  . Excision soft tissue mass right foreman  12-08-2006  . Abdominal hysterectomy  2006    staging for Ovarian cancer  . Laparoscopic cholecystectomy    . Cystoscopy with retrograde pyelogram, ureteroscopy and stent placement Right 05/02/2015    Procedure: CYSTOSCOPY WITH RIGHT RETROGRADE PYELOGRAM,  DIAGNOSTIC URETEROSCOPY AND STENT PULL ;  Surgeon: Alexis Frock, MD;  Location: [facility];  Service: Urology;  Laterality: Right;  . Cystoscopy with retrograde pyelogram, ureteroscopy and stent  placement Right 09/05/2015    Procedure: CYSTOSCOPY WITH RETROGRADE PYELOGRAM,  AND STENT PLACEMENT;  Surgeon: Alexis Frock, MD;  [facility];  Service: Urology;  Laterality: Right;  . Cystoscopy w/ retrogrades Right 11/21/2015    Procedure: CYSTOSCOPY WITH RETROGRADE PYELOGRAM;  Surgeon: Alexis Frock, MD;  [facility];  Service: Urology;  Laterality: Right;  . Cystoscopy w/ ureteral stent placement Right 11/21/2015    Procedure: CYSTOSCOPY WITH STENT REPLACEMENT;  Surgeon: Alexis Frock, MD;  [facility];  Service: Urology;  Laterality: Right;  . Mastectomy Right 12/11/2015    PROPHYLACTIC   . Mastectomy complete / simple w/ sentinel node biopsy Left 12/11/2015    AXILLARY SENTINEL LYMPH NODE BIOPSY   . Mastectomy w/ sentinel node biopsy Bilateral 12/11/2015    Procedure: RIGHT PROPHYLACTIC MASTECTOMY, LEFT TOTAL MASTECTOMY WITH LEFT AXILLARY SENTINEL LYMPH NODE BIOPSY;  Surgeon: Stark Klein, MD;  Location: [place];  Service: General;  Laterality: Bilateral;  . Breast reconstruction with placement of tissue expander and flex hd (acellular hydrated dermis) Bilateral 12/11/2015    Procedure: BILATERAL BREAST RECONSTRUCTION WITH PLACEMENT OF TISSUE EXPANDERS;  Surgeon: Irene Limbo, MD;  Location: [place];  Service: Plastics;  Laterality: Bilateral;  . Incision and drainage of wound Bilateral 12/26/2015    Procedure: DEBRIDEMENT OF BILATERAL MASTECTOMY FLAPS;  Surgeon: Irene Limbo, MD;  Location: [place];  Service: Plastics;  Laterality: Bilateral;  . Tissue expander filling Bilateral 12/26/2015    Procedure: EXPANSION OF BILATERAL CHEST TISSUE EXPANDERS (60 mL- Right; 75 mL- Left);  Surgeon: Irene Limbo, MD;  Location: [place];  Service: Plastics;  Laterality: Bilateral;    There were no vitals filed for this visit.      Subjective Assessment - 02/04/16 0942    Subjective I started taking Arimidex and had a little morning naesous but  that has subsided. Been doing my exercises at home with the cane I found. No pain in my shoulders this morning, just feels achy in the shoulder joints.   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 No/denies                         [facility] Adult PT Treatment/Exercise - 02/04/16 0001    Shoulder Exercises: Pulleys   Flexion 2 minutes   ABduction 2 minutes   Shoulder Exercises: Therapy Ball   Flexion 10 reps   Manual Therapy   Myofascial Release across left axilla with arm in flexion; myofascial pulling of each UE   Passive ROM In supine with stretch  to tolerance for er, D2, abduction, and flexion to right and left shoulders                   Short Term Clinic Goals - 01/28/16 1249    CC Short Term Goal  #1   Title Pt will be independent in verbalizing lymphedema risk reduction practices.    Status On-going   CC Short Term Goal  #2   Title Pt will demonstrate 40 degrees of L shoulder external rotation to allow pt to wash her hair.   Status On-going   CC Short Term Goal  #3   Title Pt to demonstrate 150 degrees of left shoulder flexion to allow her to reach items on shelves   Status On-going   CC Short Term Goal  #4   Title Pt to demonstrate 120 degrees of left shoulder abduction to allow pt to reach items out to sides   Status On-going             [place] - 01/19/16 1548    [place] Term Goal  #1   Title Pt to be independent with a home exercise program for strengthening and stretching.    Time 8   Period Weeks   Status New   CC Long Term Goal  #2   Title Pt to demonstrate 70 degrees of L shoulder IR  and ER rotation to allow her to complete ADLs without assist and to wash her hair   Baseline unable to assess due to pain   Time 8   Period Weeks   Status New   CC Long Term Goal  #3   Title Pt to demonstrate 160 degrees of left and right shoulder flexion to allow her to reach items on shelves   Baseline R 136 deg, L 112 deg   Time 8   Period Weeks   Status New   CC Long Term Goal  #4   Title Pt to demonstrate 145 degrees of bilateral shoulder abduction to allow pt to reach items out to sides   Baseline R 99, L 85 deg   Time 8   Period Weeks   Status New            Plan - 02/04/16 1017    Clinical Impression Statement Pt reports noticing feeling like her joint achines has increased some since starting Arimidex on 01/31/16 but overall still feels like her A/ROM with her ADLs is improving. She also reports good compliance with her HEP. Pt tolerated P/ROM well today and overall is making good progress towards her goals and is pleased    Rehab Potential Good   Clinical Impairments Affecting Rehab Potential hx of radiation for cervical cancer   PT Frequency 2x / week   PT Duration 8 weeks   PT Treatment/Interventions Manual techniques;Therapeutic exercise;Therapeutic activities;Taping;Patient/family education;Passive range of motion;DME Instruction;Scar mobilization;ADLs/Self Care Home Management   PT Next Visit Plan Measure A/ROM for goal assess. Continue P/AA/A/ROM of both shoulders.   Consulted and Agree with Plan of Care Patient      Patient will benefit from skilled therapeutic intervention in order to improve the following deficits and impairments:  Postural dysfunction, Decreased range of motion, Increased muscle spasms, Pain, Impaired UE functional use, Increased fascial restricitons, Decreased strength, Decreased knowledge of use of DME, Decreased knowledge of precautions, Decreased scar mobility  Visit Diagnosis: Stiffness of left shoulder, not elsewhere  classified  Stiffness of right shoulder, not elsewhere classified  Pain in left shoulder  Pain in right shoulder     Problem List Patient Active Problem List   Diagnosis Date Noted  . Breast cancer, left ([place]) 12/11/2015  . Genetic testing 11/27/2015  . Cancer of corpus uteri, except isthmus ([place]) 11/13/2015  . Breast cancer of upper-inner quadrant of left female breast ([place])   . Dehydration 10/09/2015  . Acute bacterial bronchitis 06/04/2015  . Hydronephrosis, right 03/19/2015  . Pelvic pain in female 03/19/2015  . Rash, skin 03/06/2015  . Diarrhea 02/20/2015  . Fever 12/30/2014  . Sepsis ([place]) 12/30/2014  . UTI (lower urinary tract infection) 12/30/2014  . Diabetes mellitus type 2, controlled ([place]) 12/30/2014  . Chronic anemia 12/30/2014  . Urinary tract infectious disease   . Hypomagnesemia 12/11/2014  . Hypokalemia 12/11/2014  . Cellulitis of left abdominal wall near colostomy 12/03/2014  . Reactive thrombocytosis 11/29/2014  . Pancytopenia, acquired ([place]) 11/26/2014  . Acute respiratory failure with hypoxia ([place]) 11/20/2014  . Morbid obesity ([place]) 11/20/2014  . Endotracheally intubated   . 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 Abdominal pain 09/09/2014  . Postmenopausal bleeding 09/05/2014  . History of ovarian & endometrial cancer 07/17/2012  . History of endometrial cancer 05/10/2012  . Malignant neoplasm of corpus uteri, except isthmus ([place]) 05/10/2012  . Malignant neoplasm of ovary ([place]) 05/10/2012  . Chronic neutropenia ([place]) 12/14/2011  . Hypothyroidism 12/14/2011  . DM type 2 (diabetes mellitus, type 2) ([place]) 12/14/2011  . Benign essential HTN 12/14/2011    Otelia Limes, PTA 02/04/2016, 10:20 AM  [place], [place], [address] Phone: [phone]   Fax:  [phone]  Name:  [name] MRN: [id number] Date of Birth: 1956/02/04

## 2016-02-05 ENCOUNTER — Ambulatory Visit: Payer: BLUE CROSS/BLUE SHIELD | Admitting: Physical Therapy

## 2016-02-05 DIAGNOSIS — M25611 Stiffness of right shoulder, not elsewhere classified: Secondary | ICD-10-CM | POA: Diagnosis not present

## 2016-02-05 DIAGNOSIS — M25512 Pain in left shoulder: Secondary | ICD-10-CM

## 2016-02-05 DIAGNOSIS — M25612 Stiffness of left shoulder, not elsewhere classified: Secondary | ICD-10-CM

## 2016-02-05 DIAGNOSIS — M25511 Pain in right shoulder: Secondary | ICD-10-CM | POA: Diagnosis not present

## 2016-02-05 NOTE — Therapy (Signed)
[place], [place], [address] Phone: [phone]   Fax:  [phone]  Physical Therapy Treatment  Patient Details  Name: [name] MRN: [id number] Date of Birth: 1956-07-28 Referring Provider: Barry Dienes  Encounter Date: 02/05/2016      PT End of Session - 02/05/16 1707    Visit Number 6   Number of Visits 17   Date for PT Re-Evaluation 03/15/16   PT Start Time 4166   PT Stop Time 1345   PT Time Calculation (min) 40 min   Activity Tolerance Patient tolerated treatment well   Behavior During Therapy [facility] for tasks assessed/performed      Past Medical History  Diagnosis Date  . Benign essential HTN 12/14/2011  . Multiple thyroid nodules     Managed by Dr. Harlow Asa  . Dysuria   . Radiation-induced dermatitis     contact dermatitis , radiation completed, rash only on ankles now.  . History of gastric polyp     2014  duodenum  . History of radiation therapy     01-29-2015 to 03-10-2015  pelvis 50.4Gy  . Hypothyroidism   . DM type 2 (diabetes mellitus, type 2) ([place])   . Hiatal hernia   . Anemia in neoplastic disease   . Chronic idiopathic neutropenia (HCC)     presumed related to chemotherapy March 2006--- followed by dr Alvy Bimler   . Ureteral stricture, right   . S/P colostomy ([place])   . Hydronephrosis   . History of renal stent   . Neutropenia ([place])   . Complication of anesthesia     PONV  . GERD (gastroesophageal reflux disease)     takes omeprazole  . Wears glasses   . Seasonal allergies   . History of bronchitis   . Cancer of corpus uteri, except isthmus [facility]) oncologist-- dr Denman [name] and dr Alvy Bimler     Mar 2006 dx endometrial and ovarian cancer s/p  chemotheapy and surgery  . Breast cancer, left ([place]) 2017  . PONV (postoperative nausea and vomiting)     "scopolamine patch works for me"    Past Surgical History  Procedure Laterality Date  . Appendectomy    . Tonsillectomy    . Eye  surgery      pytosis of eyelids-child  . Colonoscopy with propofol N/A 08/21/2013    Procedure: COLONOSCOPY WITH PROPOFOL;  Surgeon: Cleotis Nipper, MD;  [facility];  Service: Endoscopy;  Laterality: N/A;  . Eus N/A 10/02/2014    Procedure: LOWER ENDOSCOPIC ULTRASOUND (EUS);  Surgeon: Arta Silence, MD;  [facility];  Service: Endoscopy;  Laterality: N/A;  . Robotic assisted lap vaginal hysterectomy N/A 11/19/2014    Procedure: ROBOTIC LYSIS OF ADHESIONS, CONVERTED TO LAPAROTOMY RADICAL UPPER VAGINECTOMY,LOW ANTERIOR BOWEL RESECTION, COLOSTOMY, BILATERAL URETERAL STENT PLACEMENT AND CYSTONOMY CLOSURE;  Surgeon: Everitt Amber, MD;  [facility];  Service: Gynecology;  Laterality: N/A;  . Ostomy N/A 11/19/2014    Procedure: OSTOMY;  Surgeon: Michael [name], MD;  [facility];  Service: General;  Laterality: N/A;  . Colostomy takedown N/A 12/04/2014    Procedure: LAPROSCOPIC LYSIS OF ADHESIONS, SPLENIC MOBILIZATION, RELOCATION OF COLOSTOMY, DEBRIDEMENT INITIAL COLOSTOMY SITE;  Surgeon: Michael [name], MD;  [facility];  Service: General;  Laterality: N/A;  . Cystoscopy with retrograde pyelogram, ureteroscopy and stent placement Right 03/20/2015    Procedure: CYSTOSCOPY WITH RETROGRADE PYELOGRAM, URETEROSCOPY WITH BALLOON DILATION AND STENT PLACEMENT ON RIGHT;  Surgeon: Alexis Frock, MD;  Location:  [place];  Service: Urology;  Laterality: Right;  . Excision soft tissue mass right foreman  12-08-2006  . Abdominal hysterectomy  2006    staging for Ovarian cancer  . Laparoscopic cholecystectomy    . Cystoscopy with retrograde pyelogram, ureteroscopy and stent placement Right 05/02/2015    Procedure: CYSTOSCOPY WITH RIGHT RETROGRADE PYELOGRAM,  DIAGNOSTIC URETEROSCOPY AND STENT PULL ;  Surgeon: Alexis Frock, MD;  Location: [facility];  Service: Urology;  Laterality: Right;  . Cystoscopy with retrograde pyelogram, ureteroscopy and stent  placement Right 09/05/2015    Procedure: CYSTOSCOPY WITH RETROGRADE PYELOGRAM,  AND STENT PLACEMENT;  Surgeon: Alexis Frock, MD;  [facility];  Service: Urology;  Laterality: Right;  . Cystoscopy w/ retrogrades Right 11/21/2015    Procedure: CYSTOSCOPY WITH RETROGRADE PYELOGRAM;  Surgeon: Alexis Frock, MD;  [facility];  Service: Urology;  Laterality: Right;  . Cystoscopy w/ ureteral stent placement Right 11/21/2015    Procedure: CYSTOSCOPY WITH STENT REPLACEMENT;  Surgeon: Alexis Frock, MD;  [facility];  Service: Urology;  Laterality: Right;  . Mastectomy Right 12/11/2015    PROPHYLACTIC   . Mastectomy complete / simple w/ sentinel node biopsy Left 12/11/2015    AXILLARY SENTINEL LYMPH NODE BIOPSY   . Mastectomy w/ sentinel node biopsy Bilateral 12/11/2015    Procedure: RIGHT PROPHYLACTIC MASTECTOMY, LEFT TOTAL MASTECTOMY WITH LEFT AXILLARY SENTINEL LYMPH NODE BIOPSY;  Surgeon: Stark Klein, MD;  Location: [place];  Service: General;  Laterality: Bilateral;  . Breast reconstruction with placement of tissue expander and flex hd (acellular hydrated dermis) Bilateral 12/11/2015    Procedure: BILATERAL BREAST RECONSTRUCTION WITH PLACEMENT OF TISSUE EXPANDERS;  Surgeon: Irene Limbo, MD;  Location: [place];  Service: Plastics;  Laterality: Bilateral;  . Incision and drainage of wound Bilateral 12/26/2015    Procedure: DEBRIDEMENT OF BILATERAL MASTECTOMY FLAPS;  Surgeon: Irene Limbo, MD;  Location: [place];  Service: Plastics;  Laterality: Bilateral;  . Tissue expander filling Bilateral 12/26/2015    Procedure: EXPANSION OF BILATERAL CHEST TISSUE EXPANDERS (60 mL- Right; 75 mL- Left);  Surgeon: Irene Limbo, MD;  Location: [place];  Service: Plastics;  Laterality: Bilateral;    There were no vitals filed for this visit.      Subjective Assessment - 02/05/16 1308    Subjective "i've been working on it"   pt feels that her shoulders have  improved.    Pertinent History Breast cancer of upper-inner quadrant of left female breast ([place]) Stage IIA (T2, N0, M0) ER,PR positive Her2 neg, s/p bilateral mastectomy, pt has hx of cervical cancer with recurrence and was treated with chemo and radiation, she is diabetic  Plans to have implants in Sept   Patient Stated Goals get full range of motion back, I have to ask my husband to help me open jars   Currently in Pain? No/denies  has pain in the morning, but gets better duing the day             [facility] PT Assessment - 02/05/16 0001    AROM   Right Shoulder Flexion 144 Degrees   Right Shoulder ABduction 144 Degrees   Right Shoulder External Rotation 70 Degrees   Left Shoulder Flexion 135 Degrees   Left Shoulder ABduction 143 Degrees   Left Shoulder External Rotation 55 Degrees                     [facility] Adult PT Treatment/Exercise -  02/05/16 0001    Elbow Exercises   Elbow Extension AROM;Both;10 reps;Supine   Other elbow exercises small circles to each direction with both arms    Shoulder Exercises: Supine   Protraction AROM;Both;10 reps   External Rotation AROM;Both;10 reps  also contract/relax for stretching    Shoulder Exercises: Seated   External Rotation Strengthening;Both;10 reps;Theraband   Theraband Level (Shoulder External Rotation) Level 1 (Yellow)   Other Seated Exercises neck range of motion with thoracic extension. backward shoulder rolls    Other Seated Exercises bilateral scapular retraction.    Shoulder Exercises: Pulleys   Flexion 2 minutes   ABduction 2 minutes                PT Education - 02/05/16 1706    Education provided Yes   Education Details link for lymphatic flow yoga series    Person(s) Educated Patient   Methods Explanation;Demonstration;Handout   Comprehension Verbalized understanding;Returned demonstration           Short Term Clinic Goals - 02/05/16 1709    CC Short Term Goal  #1   Title Pt will be  independent in verbalizing lymphedema risk reduction practices.    Status On-going   CC Short Term Goal  #2   Title Pt will demonstrate 40 degrees of L shoulder external rotation to allow pt to wash her hair.   Status Achieved   CC Short Term Goal  #3   Title Pt to demonstrate 150 degrees of left shoulder flexion to allow her to reach items on shelves   Status On-going   CC Short Term Goal  #4   Title Pt to demonstrate 120 degrees of left shoulder abduction to allow pt to reach items out to sides   Baseline 85 at eval, 143 on 02/05/2016   Status Achieved             Long Term Clinic Goals - 02/05/16 1711    CC Long Term Goal  #1   Title Pt to be independent with a home exercise program for strengthening and stretching.    Status On-going   CC Long Term Goal  #2   Title Pt to demonstrate 70 degrees of L shoulder IR and ER rotation to allow her to complete ADLs without assist and to wash her hair   Status On-going   CC Long Term Goal  #3   Title Pt to demonstrate 160 degrees of left and right shoulder flexion to allow her to reach items on shelves   Status On-going   CC Long Term Goal  #4   Title Pt to demonstrate 145 degrees of bilateral shoulder abduction to allow pt to reach items out to sides   Status On-going            Plan - 02/05/16 1707    Clinical Impression Statement pt is improving with joint range of motion and is ready to begin more scapular strengthening.  Upgraded home program to include neck ROM.    Rehab Potential Good   Clinical Impairments Affecting Rehab Potential hx of radiation for cervical cancer   PT Next Visit Plan  Continue P/AA/A/ROM of both shoulders.  Progress scapular and shoulder strengthening   Consulted and Agree with Plan of Care Patient      Patient will benefit from skilled therapeutic intervention in order to improve the following deficits and impairments:  Postural dysfunction, Decreased range of motion, Increased muscle spasms,  Pain, Impaired UE functional use, Increased fascial  restricitons, Decreased strength, Decreased knowledge of use of DME, Decreased knowledge of precautions, Decreased scar mobility  Visit Diagnosis: Stiffness of left shoulder, not elsewhere classified  Stiffness of right shoulder, not elsewhere classified  Pain in left shoulder  Pain in right shoulder     Problem List Patient Active Problem List   Diagnosis Date Noted  . Breast cancer, left ([place]) 12/11/2015  . Genetic testing 11/27/2015  . Cancer of corpus uteri, except isthmus ([place]) 11/13/2015  . Breast cancer of upper-inner quadrant of left female breast ([place])   . Dehydration 10/09/2015  . Acute bacterial bronchitis 06/04/2015  . Hydronephrosis, right 03/19/2015  . Pelvic pain in female 03/19/2015  . Rash, skin 03/06/2015  . Diarrhea 02/20/2015  . Fever 12/30/2014  . Sepsis ([place]) 12/30/2014  . UTI (lower urinary tract infection) 12/30/2014  . Diabetes mellitus type 2, controlled ([place]) 12/30/2014  . Chronic anemia 12/30/2014  . Urinary tract infectious disease   . Hypomagnesemia 12/11/2014  . Hypokalemia 12/11/2014  . Cellulitis of left abdominal wall near colostomy 12/03/2014  . Reactive thrombocytosis 11/29/2014  . Pancytopenia, acquired ([place]) 11/26/2014  . Acute respiratory failure with hypoxia ([place]) 11/20/2014  . Morbid obesity ([place]) 11/20/2014  . Endotracheally intubated   . 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 Abdominal pain 09/09/2014  . Postmenopausal bleeding 09/05/2014  . History of ovarian & endometrial cancer 07/17/2012  . History of endometrial cancer 05/10/2012  . Malignant neoplasm of corpus uteri, except isthmus ([place]) 05/10/2012  . Malignant neoplasm of ovary ([place]) 05/10/2012  . Chronic neutropenia ([place]) 12/14/2011  . Hypothyroidism 12/14/2011  . DM type 2 (diabetes mellitus, type 2) ([place]) 12/14/2011  . Benign  essential HTN 12/14/2011    Donato Heinz. Owens Shark PT   Norwood Levo 02/05/2016, 5:12 PM  [place], [place], [address] Phone: [phone]   Fax:  [phone]  Name: Amneet Cendejas MRN: [id number] Date of Birth: Oct 18, 1955

## 2016-02-05 NOTE — Patient Instructions (Signed)
[email or web address] Lymphatic flow series with Shoosh Lettick Crotzer      NO MONEY  :-) with yellow band

## 2016-02-08 DIAGNOSIS — D3502 Benign neoplasm of left adrenal gland: Secondary | ICD-10-CM

## 2016-02-08 DIAGNOSIS — K760 Fatty (change of) liver, not elsewhere classified: Secondary | ICD-10-CM

## 2016-02-08 HISTORY — DX: Benign neoplasm of left adrenal gland: D35.02

## 2016-02-08 HISTORY — DX: Fatty (change of) liver, not elsewhere classified: K76.0

## 2016-02-09 ENCOUNTER — Telehealth: Payer: Self-pay | Admitting: Gynecologic Oncology

## 2016-02-09 ENCOUNTER — Ambulatory Visit (HOSPITAL_COMMUNITY)
Admission: RE | Admit: 2016-02-09 | Discharge: 2016-02-09 | Disposition: A | Discharge status: Home / Self Care | Payer: BLUE CROSS/BLUE SHIELD | Source: Ambulatory Visit | Attending: Gynecologic Oncology | Admitting: Gynecologic Oncology

## 2016-02-09 ENCOUNTER — Encounter (HOSPITAL_COMMUNITY): Payer: Self-pay

## 2016-02-09 DIAGNOSIS — N1330 Unspecified hydronephrosis: Secondary | ICD-10-CM | POA: Diagnosis not present

## 2016-02-09 DIAGNOSIS — C541 Malignant neoplasm of endometrium: Secondary | ICD-10-CM | POA: Insufficient documentation

## 2016-02-09 DIAGNOSIS — Z960 Presence of urogenital implants: Secondary | ICD-10-CM | POA: Insufficient documentation

## 2016-02-09 DIAGNOSIS — D3502 Benign neoplasm of left adrenal gland: Secondary | ICD-10-CM | POA: Diagnosis not present

## 2016-02-09 DIAGNOSIS — K760 Fatty (change of) liver, not elsewhere classified: Secondary | ICD-10-CM | POA: Diagnosis not present

## 2016-02-09 LAB — POCT I-STAT CREATININE: CREATININE: 1 mg/dL (ref 0.44–1.00)

## 2016-02-09 MED ORDER — IOPAMIDOL (ISOVUE-300) INJECTION 61%
100.0000 mL | Freq: Once | INTRAVENOUS | Status: AC | PRN
  Administered 2016-02-09: 100 mL via INTRAVENOUS

## 2016-02-09 NOTE — Telephone Encounter (Signed)
Patient informed of CT scan results.  No concerns voiced.  Advised to call for any questions or concerns.

## 2016-02-10 ENCOUNTER — Encounter: Payer: Self-pay | Admitting: Physical Therapy

## 2016-02-10 ENCOUNTER — Ambulatory Visit: Payer: BLUE CROSS/BLUE SHIELD | Admitting: Physical Therapy

## 2016-02-10 DIAGNOSIS — M25511 Pain in right shoulder: Secondary | ICD-10-CM | POA: Diagnosis not present

## 2016-02-10 DIAGNOSIS — M25611 Stiffness of right shoulder, not elsewhere classified: Secondary | ICD-10-CM | POA: Diagnosis not present

## 2016-02-10 DIAGNOSIS — M25512 Pain in left shoulder: Secondary | ICD-10-CM | POA: Diagnosis not present

## 2016-02-10 DIAGNOSIS — M25612 Stiffness of left shoulder, not elsewhere classified: Secondary | ICD-10-CM | POA: Diagnosis not present

## 2016-02-10 NOTE — Therapy (Signed)
[place], [place], [address] Phone: [phone]   Fax:  [phone]  Physical Therapy Treatment  Patient Details  Name: [name] MRN: [id number] Date of Birth: May 11, 1956 Referring Provider: Barry Dienes  Encounter Date: 02/10/2016      PT End of Session - 02/10/16 1617    Visit Number 7   Number of Visits 17   Date for PT Re-Evaluation 03/15/16   PT Start Time 6962   PT Stop Time 1515   PT Time Calculation (min) 39 min   Activity Tolerance Patient tolerated treatment well   Behavior During Therapy [facility] for tasks assessed/performed      Past Medical History  Diagnosis Date  . Benign essential HTN 12/14/2011  . Multiple thyroid nodules     Managed by Dr. Harlow Asa  . Dysuria   . Radiation-induced dermatitis     contact dermatitis , radiation completed, rash only on ankles now.  . History of gastric polyp     2014  duodenum  . History of radiation therapy     01-29-2015 to 03-10-2015  pelvis 50.4Gy  . Hypothyroidism   . DM type 2 (diabetes mellitus, type 2) ([place])   . Hiatal hernia   . Anemia in neoplastic disease   . Chronic idiopathic neutropenia (HCC)     presumed related to chemotherapy March 2006--- followed by dr Alvy Bimler   . Ureteral stricture, right   . S/P colostomy ([place])   . Hydronephrosis   . History of renal stent   . Neutropenia ([place])   . Complication of anesthesia     PONV  . GERD (gastroesophageal reflux disease)     takes omeprazole  . Wears glasses   . Seasonal allergies   . History of bronchitis   . Cancer of corpus uteri, except isthmus [facility]) oncologist-- dr Denman [name] and dr Alvy Bimler     Mar 2006 dx endometrial and ovarian cancer s/p  chemotheapy and surgery  . Breast cancer, left ([place]) 2017  . PONV (postoperative nausea and vomiting)     "scopolamine patch works for me"    Past Surgical History  Procedure Laterality Date  . Appendectomy    . Tonsillectomy    . Eye  surgery      pytosis of eyelids-child  . Colonoscopy with propofol N/A 08/21/2013    Procedure: COLONOSCOPY WITH PROPOFOL;  Surgeon: Cleotis Nipper, MD;  Location: [facility];  Service: Endoscopy;  Laterality: N/A;  . Eus N/A 10/02/2014    Procedure: LOWER ENDOSCOPIC ULTRASOUND (EUS);  Surgeon: Arta Silence, MD;  Location: [facility];  Service: Endoscopy;  Laterality: N/A;  . Robotic assisted lap vaginal hysterectomy N/A 11/19/2014    Procedure: ROBOTIC LYSIS OF ADHESIONS, CONVERTED TO LAPAROTOMY RADICAL UPPER VAGINECTOMY,LOW ANTERIOR BOWEL RESECTION, COLOSTOMY, BILATERAL URETERAL STENT PLACEMENT AND CYSTONOMY CLOSURE;  Surgeon: Everitt Amber, MD;  Location: [facility];  Service: Gynecology;  Laterality: N/A;  . Ostomy N/A 11/19/2014    Procedure: OSTOMY;  Surgeon: Michael [name], MD;  Location: [facility];  Service: General;  Laterality: N/A;  . Colostomy takedown N/A 12/04/2014    Procedure: LAPROSCOPIC LYSIS OF ADHESIONS, SPLENIC MOBILIZATION, RELOCATION OF COLOSTOMY, DEBRIDEMENT INITIAL COLOSTOMY SITE;  Surgeon: Michael [name], MD;  Location: [facility];  Service: General;  Laterality: N/A;  . Cystoscopy with retrograde pyelogram, ureteroscopy and stent placement Right 03/20/2015    Procedure: CYSTOSCOPY WITH RETROGRADE PYELOGRAM, URETEROSCOPY WITH BALLOON DILATION AND STENT PLACEMENT ON RIGHT;  Surgeon: Alexis Frock, MD;  Location:  [place];  Service: Urology;  Laterality: Right;  . Excision soft tissue mass right foreman  12-08-2006  . Abdominal hysterectomy  2006    staging for Ovarian cancer  . Laparoscopic cholecystectomy    . Cystoscopy with retrograde pyelogram, ureteroscopy and stent placement Right 05/02/2015    Procedure: CYSTOSCOPY WITH RIGHT RETROGRADE PYELOGRAM,  DIAGNOSTIC URETEROSCOPY AND STENT PULL ;  Surgeon: Alexis Frock, MD;  Location: [facility];  Service: Urology;  Laterality: Right;  . Cystoscopy with retrograde pyelogram, ureteroscopy and stent  placement Right 09/05/2015    Procedure: CYSTOSCOPY WITH RETROGRADE PYELOGRAM,  AND STENT PLACEMENT;  Surgeon: Alexis Frock, MD;  [facility];  Service: Urology;  Laterality: Right;  . Cystoscopy w/ retrogrades Right 11/21/2015    Procedure: CYSTOSCOPY WITH RETROGRADE PYELOGRAM;  Surgeon: Alexis Frock, MD;  [facility]: [facility];  Service: Urology;  Laterality: Right;  . Cystoscopy w/ ureteral stent placement Right 11/21/2015    Procedure: CYSTOSCOPY WITH STENT REPLACEMENT;  Surgeon: Alexis Frock, MD;  [facility]: [facility];  Service: Urology;  Laterality: Right;  . Mastectomy Right 12/11/2015    PROPHYLACTIC   . Mastectomy complete / simple w/ sentinel node biopsy Left 12/11/2015    AXILLARY SENTINEL LYMPH NODE BIOPSY   . Mastectomy w/ sentinel node biopsy Bilateral 12/11/2015    Procedure: RIGHT PROPHYLACTIC MASTECTOMY, LEFT TOTAL MASTECTOMY WITH LEFT AXILLARY SENTINEL LYMPH NODE BIOPSY;  Surgeon: Stark Klein, MD;  Location: [facility];  Service: General;  Laterality: Bilateral;  . Breast reconstruction with placement of tissue expander and flex hd (acellular hydrated dermis) Bilateral 12/11/2015    Procedure: BILATERAL BREAST RECONSTRUCTION WITH PLACEMENT OF TISSUE EXPANDERS;  Surgeon: Irene Limbo, MD;  Location: [place];  Service: Plastics;  Laterality: Bilateral;  . Incision and drainage of wound Bilateral 12/26/2015    Procedure: DEBRIDEMENT OF BILATERAL MASTECTOMY FLAPS;  Surgeon: Irene Limbo, MD;  Location: [place];  Service: Plastics;  Laterality: Bilateral;  . Tissue expander filling Bilateral 12/26/2015    Procedure: EXPANSION OF BILATERAL CHEST TISSUE EXPANDERS (60 mL- Right; 75 mL- Left);  Surgeon: Irene Limbo, MD;  Location: [place];  Service: Plastics;  Laterality: Bilateral;    There were no vitals filed for this visit.      Subjective Assessment - 02/10/16 1439    Subjective I had a CT scan yesterday and it was negative. My shoulders are  ok but they are still achy in the mornings but they told me it would be that way with the Arimidex.    Pertinent History Breast cancer of upper-inner quadrant of left female breast ([place]) Stage IIA (T2, N0, M0) ER,PR positive Her2 neg, s/p bilateral mastectomy, pt has hx of cervical cancer with recurrence and was treated with chemo and radiation, she is diabetic  Plans to have implants in Sept   Patient Stated Goals get full range of motion back, I have to ask my husband to help me open jars   Currently in Pain? Yes   Pain Score 1    Pain Location Shoulder   Pain Orientation Right   Pain Descriptors / Indicators Aching                         [facility] Adult PT Treatment/Exercise - 02/10/16 0001    Elbow Exercises   Other elbow exercises small circles to each direction with both arms    Shoulder Exercises: Supine   Protraction AROM;Both;10 reps  External Rotation AROM;Both;10 reps   Shoulder Exercises: Seated   External Rotation Strengthening;Both;10 reps;Theraband   Theraband Level (Shoulder External Rotation) Level 1 (Yellow)   Other Seated Exercises neck range of motion with thoracic extension. backward shoulder rolls    Other Seated Exercises bilateral scapular retraction.    Manual Therapy   Passive ROM In supine with stretch to tolerance for ER, abduction, and flexion to right and left shoulders  also did contract relax in ER and abd to increase ROM                   Short Term Clinic Goals - 02/05/16 1709    CC Short Term Goal  #1   Title Pt will be independent in verbalizing lymphedema risk reduction practices.    Status On-going   CC Short Term Goal  #2   Title Pt will demonstrate 40 degrees of L shoulder external rotation to allow pt to wash her hair.   Status Achieved   CC Short Term Goal  #3   Title Pt to demonstrate 150 degrees of left shoulder flexion to allow her to reach items on shelves   Status On-going   CC Short Term Goal  #4   Title  Pt to demonstrate 120 degrees of left shoulder abduction to allow pt to reach items out to sides   Baseline 85 at eval, 143 on 02/05/2016   Status Achieved             Long Term Clinic Goals - 02/05/16 1711    CC Long Term Goal  #1   Title Pt to be independent with a home exercise program for strengthening and stretching.    Status On-going   CC Long Term Goal  #2   Title Pt to demonstrate 70 degrees of L shoulder IR and ER rotation to allow her to complete ADLs without assist and to wash her hair   Status On-going   CC Long Term Goal  #3   Title Pt to demonstrate 160 degrees of left and right shoulder flexion to allow her to reach items on shelves   Status On-going   CC Long Term Goal  #4   Title Pt to demonstrate 145 degrees of bilateral shoulder abduction to allow pt to reach items out to sides   Status On-going            Plan - 02/10/16 1617    Clinical Impression Statement Pt has difficulty relaxing with PROM but after performing contract relax stretch she gains increased ROM. She reports she has been compliant with her home exercise program and exercises were reviewed today.   Rehab Potential Good   Clinical Impairments Affecting Rehab Potential hx of radiation for cervical cancer   PT Frequency 2x / week   PT Duration 8 weeks   PT Treatment/Interventions Manual techniques;Therapeutic exercise;Therapeutic activities;Taping;Patient/family education;Passive range of motion;DME Instruction;Scar mobilization;ADLs/Self Care Home Management   PT Next Visit Plan  Continue P/AA/A/ROM of both shoulders.  Progress scapular and shoulder strengthening, [place] wood exercises   Consulted and Agree with Plan of Care Patient      Patient will benefit from skilled therapeutic intervention in order to improve the following deficits and impairments:  Postural dysfunction, Decreased range of motion, Increased muscle spasms, Pain, Impaired UE functional use, Increased fascial restricitons,  Decreased strength, Decreased knowledge of use of DME, Decreased knowledge of precautions, Decreased scar mobility  Visit Diagnosis: Stiffness of left shoulder, not elsewhere classified  Stiffness  of right shoulder, not elsewhere classified  Pain in left shoulder  Pain in right shoulder     Problem List Patient Active Problem List   Diagnosis Date Noted  . Breast cancer, left ([place]) 12/11/2015  . Genetic testing 11/27/2015  . Cancer of corpus uteri, except isthmus ([place]) 11/13/2015  . Breast cancer of upper-inner quadrant of left female breast ([place])   . Dehydration 10/09/2015  . Acute bacterial bronchitis 06/04/2015  . Hydronephrosis, right 03/19/2015  . Pelvic pain in female 03/19/2015  . Rash, skin 03/06/2015  . Diarrhea 02/20/2015  . Fever 12/30/2014  . Sepsis ([place]) 12/30/2014  . UTI (lower urinary tract infection) 12/30/2014  . Diabetes mellitus type 2, controlled ([place]) 12/30/2014  . Chronic anemia 12/30/2014  . Urinary tract infectious disease   . Hypomagnesemia 12/11/2014  . Hypokalemia 12/11/2014  . Cellulitis of left abdominal wall near colostomy 12/03/2014  . Reactive thrombocytosis 11/29/2014  . Pancytopenia, acquired ([place]) 11/26/2014  . Acute respiratory failure with hypoxia ([place]) 11/20/2014  . Morbid obesity ([place]) 11/20/2014  . Endotracheally intubated   . 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 Abdominal pain 09/09/2014  . Postmenopausal bleeding 09/05/2014  . History of ovarian & endometrial cancer 07/17/2012  . History of endometrial cancer 05/10/2012  . Malignant neoplasm of corpus uteri, except isthmus ([place]) 05/10/2012  . Malignant neoplasm of ovary ([place]) 05/10/2012  . Chronic neutropenia ([place]) 12/14/2011  . Hypothyroidism 12/14/2011  . DM type 2 (diabetes mellitus, type 2) ([place]) 12/14/2011  . Benign essential HTN 12/14/2011    Alexia Freestone 02/10/2016, 4:19  PM  [place], [place], [address] Phone: [phone]   Fax:  [phone]  Name: [name] MRN: [id number] Date of Birth: Aug 13, 1955    Allyson Sabal, PT 02/10/2016 4:22 PM

## 2016-02-12 ENCOUNTER — Encounter: Payer: Self-pay | Admitting: Physical Therapy

## 2016-02-12 ENCOUNTER — Ambulatory Visit: Payer: BLUE CROSS/BLUE SHIELD | Admitting: Physical Therapy

## 2016-02-12 DIAGNOSIS — M25512 Pain in left shoulder: Secondary | ICD-10-CM | POA: Diagnosis not present

## 2016-02-12 DIAGNOSIS — M25611 Stiffness of right shoulder, not elsewhere classified: Secondary | ICD-10-CM

## 2016-02-12 DIAGNOSIS — M25612 Stiffness of left shoulder, not elsewhere classified: Secondary | ICD-10-CM

## 2016-02-12 DIAGNOSIS — M25511 Pain in right shoulder: Secondary | ICD-10-CM | POA: Diagnosis not present

## 2016-02-12 NOTE — Patient Instructions (Signed)
Strengthening: Resisted Internal Rotation   Hold tubing in right hand, elbow at side and forearm out. Rotate forearm in across body. Repeat _10___ times per set. Do _1___ sets per session. Do __10__ sessions per day.  http://orth.exer.us/830   Copyright  VHI. All rights reserved.  Strengthening: Resisted External Rotation   Hold tubing in right hand, elbow at side and forearm across body. Rotate forearm out. Repeat __10__ times per set. Do _1___ sets per session. Do ___2_ sessions per day.  http://orth.exer.us/828   Copyright  VHI. All rights reserved.  Strengthening: Resisted Flexion   Hold tubing with right arm at side. Pull forward and up. Move shoulder through pain-free range of motion. Repeat _10___ times per set. Do _1___ sets per session. Do __2__ sessions per day.  http://orth.exer.us/824   Copyright  VHI. All rights reserved.  Strengthening: Resisted Extension   Hold tubing in right hand, arm forward. Pull arm back, elbow straight. Repeat __10__ times per set. Do _1___ sets per session. Do _2___ sessions per day.  http://orth.exer.us/832   Copyright  VHI. All rights reserved.   Repeat all the above on left side

## 2016-02-12 NOTE — Therapy (Signed)
[place], [place], [address] Phone: [phone]   Fax:  [phone]  Physical Therapy Treatment  Patient Details  Name: [name] MRN: [id number] Date of Birth: 08-Sep-1955 Referring Provider: Barry Dienes  Encounter Date: 02/12/2016      PT End of Session - 02/12/16 1612    Visit Number 8   Number of Visits 17   Date for PT Re-Evaluation 03/15/16   PT Start Time 9937   PT Stop Time 1519   PT Time Calculation (min) 41 min   Activity Tolerance Patient tolerated treatment well   Behavior During Therapy [facility] for tasks assessed/performed      Past Medical History  Diagnosis Date  . Benign essential HTN 12/14/2011  . Multiple thyroid nodules     Managed by Dr. Harlow Asa  . Dysuria   . Radiation-induced dermatitis     contact dermatitis , radiation completed, rash only on ankles now.  . History of gastric polyp     2014  duodenum  . History of radiation therapy     01-29-2015 to 03-10-2015  pelvis 50.4Gy  . Hypothyroidism   . DM type 2 (diabetes mellitus, type 2) ([place])   . Hiatal hernia   . Anemia in neoplastic disease   . Chronic idiopathic neutropenia (HCC)     presumed related to chemotherapy March 2006--- followed by dr Alvy Bimler   . Ureteral stricture, right   . S/P colostomy ([place])   . Hydronephrosis   . History of renal stent   . Neutropenia ([place])   . Complication of anesthesia     PONV  . GERD (gastroesophageal reflux disease)     takes omeprazole  . Wears glasses   . Seasonal allergies   . History of bronchitis   . 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 Breast cancer, left ([place]) 2017  . PONV (postoperative nausea and vomiting)     "scopolamine patch works for me"    Past Surgical History  Procedure Laterality Date  . Appendectomy    . Tonsillectomy    . Eye  surgery      pytosis of eyelids-child  . Colonoscopy with propofol N/A 08/21/2013    Procedure: COLONOSCOPY WITH PROPOFOL;  Surgeon: Cleotis Nipper, MD;  Location: [facility];  Service: Endoscopy;  Laterality: N/A;  . Eus N/A 10/02/2014    Procedure: LOWER ENDOSCOPIC ULTRASOUND (EUS);  Surgeon: Arta Silence, MD;  Location: [facility];  Service: Endoscopy;  Laterality: N/A;  . Robotic assisted lap vaginal hysterectomy N/A 11/19/2014    Procedure: ROBOTIC LYSIS OF ADHESIONS, CONVERTED TO LAPAROTOMY RADICAL UPPER VAGINECTOMY,LOW ANTERIOR BOWEL RESECTION, COLOSTOMY, BILATERAL URETERAL STENT PLACEMENT AND CYSTONOMY CLOSURE;  Surgeon: Everitt Amber, MD;  Location: [facility];  Service: Gynecology;  Laterality: N/A;  . Ostomy N/A 11/19/2014    Procedure: OSTOMY;  Surgeon: Michael [name], MD;  Location: [facility];  Service: General;  Laterality: N/A;  . Colostomy takedown N/A 12/04/2014    Procedure: LAPROSCOPIC LYSIS OF ADHESIONS, SPLENIC MOBILIZATION, RELOCATION OF COLOSTOMY, DEBRIDEMENT INITIAL COLOSTOMY SITE;  Surgeon: Michael [name], MD;  Location: [facility];  Service: General;  Laterality: N/A;  . Cystoscopy with retrograde pyelogram, ureteroscopy and stent placement Right 03/20/2015    Procedure: CYSTOSCOPY WITH RETROGRADE PYELOGRAM, URETEROSCOPY WITH BALLOON DILATION AND STENT PLACEMENT ON RIGHT;  Surgeon: Alexis Frock, MD;  Location:  [place];  Service: Urology;  Laterality: Right;  . Excision soft tissue mass right foreman  12-08-2006  . Abdominal hysterectomy  2006    staging for Ovarian cancer  . Laparoscopic cholecystectomy    . Cystoscopy with retrograde pyelogram, ureteroscopy and stent placement Right 05/02/2015    Procedure: CYSTOSCOPY WITH RIGHT RETROGRADE PYELOGRAM,  DIAGNOSTIC URETEROSCOPY AND STENT PULL ;  Surgeon: Alexis Frock, MD;  Location: [facility];  Service: Urology;  Laterality: Right;  . Cystoscopy with retrograde pyelogram, ureteroscopy and stent  placement Right 09/05/2015    Procedure: CYSTOSCOPY WITH RETROGRADE PYELOGRAM,  AND STENT PLACEMENT;  Surgeon: Alexis Frock, MD;  [facility];  Service: Urology;  Laterality: Right;  . Cystoscopy w/ retrogrades Right 11/21/2015    Procedure: CYSTOSCOPY WITH RETROGRADE PYELOGRAM;  Surgeon: Alexis Frock, MD;  [facility];  Service: Urology;  Laterality: Right;  . Cystoscopy w/ ureteral stent placement Right 11/21/2015    Procedure: CYSTOSCOPY WITH STENT REPLACEMENT;  Surgeon: Alexis Frock, MD;  Location: [facility];  Service: Urology;  Laterality: Right;  . Mastectomy Right 12/11/2015    PROPHYLACTIC   . Mastectomy complete / simple w/ sentinel node biopsy Left 12/11/2015    AXILLARY SENTINEL LYMPH NODE BIOPSY   . Mastectomy w/ sentinel node biopsy Bilateral 12/11/2015    Procedure: RIGHT PROPHYLACTIC MASTECTOMY, LEFT TOTAL MASTECTOMY WITH LEFT AXILLARY SENTINEL LYMPH NODE BIOPSY;  Surgeon: Stark Klein, MD;  Location: [place];  Service: General;  Laterality: Bilateral;  . Breast reconstruction with placement of tissue expander and flex hd (acellular hydrated dermis) Bilateral 12/11/2015    Procedure: BILATERAL BREAST RECONSTRUCTION WITH PLACEMENT OF TISSUE EXPANDERS;  Surgeon: Irene Limbo, MD;  Location: [place];  Service: Plastics;  Laterality: Bilateral;  . Incision and drainage of wound Bilateral 12/26/2015    Procedure: DEBRIDEMENT OF BILATERAL MASTECTOMY FLAPS;  Surgeon: Irene Limbo, MD;  Location: [place];  Service: Plastics;  Laterality: Bilateral;  . Tissue expander filling Bilateral 12/26/2015    Procedure: EXPANSION OF BILATERAL CHEST TISSUE EXPANDERS (60 mL- Right; 75 mL- Left);  Surgeon: Irene Limbo, MD;  Location: [place];  Service: Plastics;  Laterality: Bilateral;    There were no vitals filed for this visit.      Subjective Assessment - 02/12/16 1441    Subjective I was a little sore after last session but it wasn't horrible and  it didn't last very long.    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 No/denies   Pain Score 0-No pain                         [facility] Adult PT Treatment/Exercise - 02/12/16 0001    Shoulder Exercises: Supine   Protraction AROM;Both;10 reps   Horizontal ABduction Strengthening;Both;10 reps;Theraband   Theraband Level (Shoulder Horizontal ABduction) Level 1 (Yellow)   External Rotation Strengthening;Both;10 reps;Theraband   Theraband Level (Shoulder External Rotation) Level 1 (Yellow)   Flexion Strengthening;Both;10 reps;Theraband  with narrow and then wide grip   Theraband Level (Shoulder Flexion) Level  1 (Yellow)   Other Supine Exercises D2 x 10 reps bilat with yellow theraband   Shoulder Exercises: Seated   External Rotation Strengthening;Both;10 reps;Theraband   Theraband Level (Shoulder External Rotation) Level 1 (Yellow)   Other Seated Exercises neck range of motion with thoracic extension. backward shoulder rolls    Other Seated Exercises bilateral scapular retraction.    Shoulder Exercises: Standing   External Rotation Strengthening;Both;10 reps;Theraband   Theraband Level (Shoulder External Rotation) Level 1 (Yellow)   Internal Rotation Strengthening;Both;10 reps;Theraband   Theraband Level (Shoulder Internal Rotation) Level 1 (Yellow)   Flexion Strengthening;Both;10 reps;Theraband   Theraband Level (Shoulder Flexion) Level 1 (Yellow)   Extension Strengthening;Both;10 reps;Theraband   Theraband Level (Shoulder Extension) Level 1 (Yellow)   Shoulder Exercises: Pulleys   Flexion 2 minutes    ABduction 2 minutes                   Short Term Clinic Goals - 02/12/16 1458    CC Short Term Goal  #1   Title Pt will be independent in verbalizing lymphedema risk reduction practices.    Baseline 02/12/16- reviewed pink sheet with pt   Time 4   Period Weeks   Status On-going   CC Short Term Goal  #2   Title Pt will demonstrate 40 degrees of L shoulder external rotation to allow pt to wash her hair.   Baseline unable to assess due to pain, 02/12/16- 85 degrees   Time 4   Period Weeks   Status Achieved   CC Short Term Goal  #3   Title Pt to demonstrate 150 degrees of left shoulder flexion to allow her to reach items on shelves   Baseline 112, 02/12/16 - 145 degrees   Time 4   Period Weeks   Status On-going   CC Short Term Goal  #4   Title Pt to demonstrate 120 degrees of left shoulder abduction to allow pt to reach items out to sides   Baseline 85 at eval, 143 on 02/05/2016   Status Achieved             Long Term Clinic Goals - 02/12/16 1504    CC Long Term Goal  #1   Title Pt to be independent with a home exercise program for strengthening and stretching.    Time 8   Period Weeks   Status On-going   CC Long Term Goal  #2   Title Pt to demonstrate 70 degrees of L shoulder IR and ER rotation to allow her to complete ADLs without assist and to wash her hair   Baseline 02/12/16- pt demonstrates 75 degrees of IR and 85 degrees of Er   Time 8   Period Weeks   Status Achieved   CC Long Term Goal  #3   Title Pt to demonstrate 160 degrees of left and right shoulder flexion to allow her to reach items on shelves   Baseline R 136 deg, L 112 deg, 02/12/16  L 145 degrees, R 130 degrees   Time 8   Period Weeks   Status On-going   CC Long Term Goal  #4   Title Pt to demonstrate 145 degrees of bilateral shoulder abduction to allow pt to reach items out to sides   Baseline R 99, L 85 deg, 02/12/16- L 169, R 171   Time 8   Period Weeks   Status Achieved             Plan -  02/12/16 1540    Clinical Impression Statement Pt made excellent progress towards goals in therapy today. She was instructed in numerous strengthening exercises with yellow theraband and she tolerated all of them well with no increase in pain. Additional exercises were added to pt's home exercise program. She demonstrates improvement in bilateral shoulder ROM. Bilateral shoulder flexion continues to be more limited.    Rehab Potential Good   Clinical Impairments Affecting Rehab Potential hx of radiation for cervical cancer   PT Frequency 2x / week   PT Duration 8 weeks   PT Treatment/Interventions Manual techniques;Therapeutic exercise;Therapeutic activities;Taping;Patient/family education;Passive range of motion;DME Instruction;Scar mobilization;ADLs/Self Care Home Management   PT Next Visit Plan  Continue P/AA/A/ROM of both shoulders.  Progress scapular and shoulder strengthening, Assess for indep with [place]- add supine scapular exercises to HEP   PT Home Exercise Plan Rolling ball up wall; supine cane exercises on her bed at home with more room than in clinic; and neural tension stretch on wall, [place] exercises   Consulted and Agree with Plan of Care Patient      Patient will benefit from skilled therapeutic intervention in order to improve the following deficits and impairments:  Postural dysfunction, Decreased range of motion, Increased muscle spasms, Pain, Impaired UE functional use, Increased fascial restricitons, Decreased strength, Decreased knowledge of use of DME, Decreased knowledge of precautions, Decreased scar mobility  Visit Diagnosis: Stiffness of left shoulder, not elsewhere classified  Stiffness of right shoulder, not elsewhere classified  Pain in left shoulder  Pain in right shoulder     Problem List Patient Active Problem List   Diagnosis Date Noted  . Breast cancer, left ([place]) 12/11/2015  . Genetic testing 11/27/2015  . Cancer of corpus uteri,  except isthmus ([place]) 11/13/2015  . 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 Dehydration 10/09/2015  . Acute bacterial bronchitis 06/04/2015  . Hydronephrosis, right 03/19/2015  . Pelvic pain in female 03/19/2015  . Rash, skin 03/06/2015  . Diarrhea 02/20/2015  . Fever 12/30/2014  . Sepsis ([place]) 12/30/2014  . UTI (lower urinary tract infection) 12/30/2014  . Diabetes mellitus type 2, controlled ([place]) 12/30/2014  . Chronic anemia 12/30/2014  . Urinary tract infectious disease   . Hypomagnesemia 12/11/2014  . Hypokalemia 12/11/2014  . Cellulitis of left abdominal wall near colostomy 12/03/2014  . Reactive thrombocytosis 11/29/2014  . Pancytopenia, acquired ([place]) 11/26/2014  . Acute respiratory failure with hypoxia ([place]) 11/20/2014  . Morbid obesity ([place]) 11/20/2014  . Endotracheally intubated   . 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 Abdominal pain 09/09/2014  . Postmenopausal bleeding 09/05/2014  . History of ovarian & endometrial cancer 07/17/2012  . History of endometrial cancer 05/10/2012  . Malignant neoplasm of corpus uteri, except isthmus ([place]) 05/10/2012  . Malignant neoplasm of ovary ([place]) 05/10/2012  . Chronic neutropenia ([place]) 12/14/2011  . Hypothyroidism 12/14/2011  . DM type 2 (diabetes mellitus, type 2) ([place]) 12/14/2011  . Benign essential HTN 12/14/2011    Alexia Freestone 02/12/2016, 4:13 PM  [place], [place], [address] Phone: [phone]   Fax:  [phone]  Name: [name] MRN: [id number] Date of Birth: 02-Nov-1955    Allyson Sabal, PT 02/12/2016 4:13 PM

## 2016-02-17 ENCOUNTER — Encounter: Payer: Self-pay | Admitting: Physical Therapy

## 2016-02-17 ENCOUNTER — Ambulatory Visit: Payer: BLUE CROSS/BLUE SHIELD | Admitting: Physical Therapy

## 2016-02-17 DIAGNOSIS — M25612 Stiffness of left shoulder, not elsewhere classified: Secondary | ICD-10-CM

## 2016-02-17 DIAGNOSIS — M25512 Pain in left shoulder: Secondary | ICD-10-CM | POA: Diagnosis not present

## 2016-02-17 DIAGNOSIS — M25511 Pain in right shoulder: Secondary | ICD-10-CM | POA: Diagnosis not present

## 2016-02-17 DIAGNOSIS — M25611 Stiffness of right shoulder, not elsewhere classified: Secondary | ICD-10-CM | POA: Diagnosis not present

## 2016-02-17 NOTE — Therapy (Signed)
[place], [place], [address] Phone: [phone]   Fax:  [phone]  Physical Therapy Treatment  Patient Details  Name: [name] MRN: [id number] Date of Birth: October 22, 1955 Referring Provider: Barry Dienes  Encounter Date: 02/17/2016      PT End of Session - 02/17/16 1733    Visit Number 9   Number of Visits 17   Date for PT Re-Evaluation 03/15/16   PT Start Time 1440   PT Stop Time 1523   PT Time Calculation (min) 43 min   Activity Tolerance Patient tolerated treatment well   Behavior During Therapy [facility] for tasks assessed/performed      Past Medical History  Diagnosis Date  . Benign essential HTN 12/14/2011  . Multiple thyroid nodules     Managed by Dr. Harlow Asa  . Dysuria   . Radiation-induced dermatitis     contact dermatitis , radiation completed, rash only on ankles now.  . History of gastric polyp     2014  duodenum  . History of radiation therapy     01-29-2015 to 03-10-2015  pelvis 50.4Gy  . Hypothyroidism   . DM type 2 (diabetes mellitus, type 2) ([place])   . Hiatal hernia   . Anemia in neoplastic disease   . Chronic idiopathic neutropenia (HCC)     presumed related to chemotherapy March 2006--- followed by dr Alvy Bimler   . Ureteral stricture, right   . S/P colostomy ([place])   . Hydronephrosis   . History of renal stent   . Neutropenia ([place])   . Complication of anesthesia     PONV  . GERD (gastroesophageal reflux disease)     takes omeprazole  . Wears glasses   . Seasonal allergies   . History of bronchitis   . 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 Breast cancer, left ([place]) 2017  . PONV (postoperative nausea and vomiting)     "scopolamine patch works for me"    Past Surgical History  Procedure Laterality Date  . Appendectomy    . Tonsillectomy    . Eye  surgery      pytosis of eyelids-child  . Colonoscopy with propofol N/A 08/21/2013    Procedure: COLONOSCOPY WITH PROPOFOL;  Surgeon: Cleotis Nipper, MD;  Location: [facility];  Service: Endoscopy;  Laterality: N/A;  . Eus N/A 10/02/2014    Procedure: LOWER ENDOSCOPIC ULTRASOUND (EUS);  Surgeon: Arta Silence, MD;  Location: [facility];  Service: Endoscopy;  Laterality: N/A;  . Robotic assisted lap vaginal hysterectomy N/A 11/19/2014    Procedure: ROBOTIC LYSIS OF ADHESIONS, CONVERTED TO LAPAROTOMY RADICAL UPPER VAGINECTOMY,LOW ANTERIOR BOWEL RESECTION, COLOSTOMY, BILATERAL URETERAL STENT PLACEMENT AND CYSTONOMY CLOSURE;  Surgeon: Everitt Amber, MD;  [facility]: [facility];  Service: Gynecology;  Laterality: N/A;  . Ostomy N/A 11/19/2014    Procedure: OSTOMY;  Surgeon: Michael [name], MD;  [facility];  Service: General;  Laterality: N/A;  . Colostomy takedown N/A 12/04/2014    Procedure: LAPROSCOPIC LYSIS OF ADHESIONS, SPLENIC MOBILIZATION, RELOCATION OF COLOSTOMY, DEBRIDEMENT INITIAL COLOSTOMY SITE;  Surgeon: Michael [name], MD;  [facility];  Service: General;  Laterality: N/A;  . Cystoscopy with retrograde pyelogram, ureteroscopy and stent placement Right 03/20/2015    Procedure: CYSTOSCOPY WITH RETROGRADE PYELOGRAM, URETEROSCOPY WITH BALLOON DILATION AND STENT PLACEMENT ON RIGHT;  Surgeon: Alexis Frock, MD;  Location:  [facility];  Service: Urology;  Laterality: Right;  . Excision soft tissue mass right foreman  12-08-2006  . Abdominal hysterectomy  2006    staging for Ovarian cancer  . Laparoscopic cholecystectomy    . Cystoscopy with retrograde pyelogram, ureteroscopy and stent placement Right 05/02/2015    Procedure: CYSTOSCOPY WITH RIGHT RETROGRADE PYELOGRAM,  DIAGNOSTIC URETEROSCOPY AND STENT PULL ;  Surgeon: Alexis Frock, MD;  Location: [facility];  Service: Urology;  Laterality: Right;  . Cystoscopy with retrograde pyelogram, ureteroscopy and stent  placement Right 09/05/2015    Procedure: CYSTOSCOPY WITH RETROGRADE PYELOGRAM,  AND STENT PLACEMENT;  Surgeon: Alexis Frock, MD;  [facility];  Service: Urology;  Laterality: Right;  . Cystoscopy w/ retrogrades Right 11/21/2015    Procedure: CYSTOSCOPY WITH RETROGRADE PYELOGRAM;  Surgeon: Alexis Frock, MD;  [facility];  Service: Urology;  Laterality: Right;  . Cystoscopy w/ ureteral stent placement Right 11/21/2015    Procedure: CYSTOSCOPY WITH STENT REPLACEMENT;  Surgeon: Alexis Frock, MD;  [facility];  Service: Urology;  Laterality: Right;  . Mastectomy Right 12/11/2015    PROPHYLACTIC   . Mastectomy complete / simple w/ sentinel node biopsy Left 12/11/2015    AXILLARY SENTINEL LYMPH NODE BIOPSY   . Mastectomy w/ sentinel node biopsy Bilateral 12/11/2015    Procedure: RIGHT PROPHYLACTIC MASTECTOMY, LEFT TOTAL MASTECTOMY WITH LEFT AXILLARY SENTINEL LYMPH NODE BIOPSY;  Surgeon: Stark Klein, MD;  Location: [facility];  Service: General;  Laterality: Bilateral;  . Breast reconstruction with placement of tissue expander and flex hd (acellular hydrated dermis) Bilateral 12/11/2015    Procedure: BILATERAL BREAST RECONSTRUCTION WITH PLACEMENT OF TISSUE EXPANDERS;  Surgeon: Irene Limbo, MD;  Location: [facility];  Service: Plastics;  Laterality: Bilateral;  . Incision and drainage of wound Bilateral 12/26/2015    Procedure: DEBRIDEMENT OF BILATERAL MASTECTOMY FLAPS;  Surgeon: Irene Limbo, MD;  Location: [place];  Service: Plastics;  Laterality: Bilateral;  . Tissue expander filling Bilateral 12/26/2015    Procedure: EXPANSION OF BILATERAL CHEST TISSUE EXPANDERS (60 mL- Right; 75 mL- Left);  Surgeon: Irene Limbo, MD;  Location: [facility];  Service: Plastics;  Laterality: Bilateral;    There were no vitals filed for this visit.      Subjective Assessment - 02/17/16 1443    Subjective I did ok after last session. I am getting all confused with the  door knob exercises. I need a review of those.    Pertinent History Breast cancer of upper-inner quadrant of left female breast ([employer]) Stage IIA (T2, N0, M0) ER,PR positive Her2 neg, s/p bilateral mastectomy, pt has hx of cervical cancer with recurrence and was treated with chemo and radiation, she is diabetic  Plans to have implants in Sept   Patient Stated Goals get full range of motion back, I have to ask my husband to help me open jars   Currently in Pain? No/denies   Pain Score 0-No pain                         [facility] Adult PT Treatment/Exercise - 02/17/16 0001    Shoulder Exercises: Supine   Protraction AROM;Both;10 reps   Horizontal ABduction Strengthening;Both;10 reps;Theraband   Theraband Level (Shoulder Horizontal ABduction) Level 1 (Yellow)   External Rotation Strengthening;Both;10 reps;Theraband   Theraband Level (Shoulder External Rotation) Level 1 (Yellow)   Flexion Strengthening;Both;10 reps;Theraband  with narrow and then wide grip   Theraband  Level (Shoulder Flexion) Level 1 (Yellow)   Other Supine Exercises D2 x 10 reps bilat with yellow theraband   Shoulder Exercises: Seated   External Rotation Strengthening;Both;10 reps;Theraband   Theraband Level (Shoulder External Rotation) Level 1 (Yellow)   Other Seated Exercises neck range of motion with thoracic extension. backward shoulder rolls    Other Seated Exercises bilateral scapular retraction.    Shoulder Exercises: Standing   External Rotation Strengthening;Both;10 reps;Theraband   Theraband Level (Shoulder External Rotation) Level 1 (Yellow)   Internal Rotation Strengthening;Both;10 reps;Theraband   Theraband Level (Shoulder Internal Rotation) Level 1 (Yellow)   Flexion Strengthening;Both;10 reps;Theraband   Theraband Level (Shoulder Flexion) Level 1 (Yellow)   Extension Strengthening;Both;10 reps;Theraband   Theraband Level (Shoulder Extension) Level 1 (Yellow)   Shoulder Exercises: Pulleys    Flexion 2 minutes   ABduction 2 minutes   Manual Therapy   Passive ROM In supine with stretch to tolerance for ER, abduction, and flexion to right and left shoulders                   Short Term Clinic Goals - 02/12/16 1458    CC Short Term Goal  #1   Title Pt will be independent in verbalizing lymphedema risk reduction practices.    Baseline 02/12/16- reviewed pink sheet with pt   Time 4   Period Weeks   Status On-going   CC Short Term Goal  #2   Title Pt will demonstrate 40 degrees of L shoulder external rotation to allow pt to wash her hair.   Baseline unable to assess due to pain, 02/12/16- 85 degrees   Time 4   Period Weeks   Status Achieved   CC Short Term Goal  #3   Title Pt to demonstrate 150 degrees of left shoulder flexion to allow her to reach items on shelves   Baseline 112, 02/12/16 - 145 degrees   Time 4   Period Weeks   Status On-going   CC Short Term Goal  #4   Title Pt to demonstrate 120 degrees of left shoulder abduction to allow pt to reach items out to sides   Baseline 85 at eval, 143 on 02/05/2016   Status Achieved             Long Term Clinic Goals - 02/12/16 1504    CC Long Term Goal  #1   Title Pt to be independent with a home exercise program for strengthening and stretching.    Time 8   Period Weeks   Status On-going   CC Long Term Goal  #2   Title Pt to demonstrate 70 degrees of L shoulder IR and ER rotation to allow her to complete ADLs without assist and to wash her hair   Baseline 02/12/16- pt demonstrates 75 degrees of IR and 85 degrees of Er   Time 8   Period Weeks   Status Achieved   CC Long Term Goal  #3   Title Pt to demonstrate 160 degrees of left and right shoulder flexion to allow her to reach items on shelves   Baseline R 136 deg, L 112 deg, 02/12/16  L 145 degrees, R 130 degrees   Time 8   Period Weeks   Status On-going   CC Long Term Goal  #4   Title Pt to demonstrate 145 degrees of bilateral shoulder abduction to  allow pt to reach items out to sides   Baseline R 99, L 85 deg, 02/12/16- L  169, R 171   Time 8   Period Weeks   Status Achieved            Plan - 02/17/16 1733    Clinical Impression Statement Pt is demonstrating increasing independence with her home exercise program. Additional exercises were added today to her HEP. She continues to be very guarded with PROM especially of L shoulder.    Rehab Potential Good   Clinical Impairments Affecting Rehab Potential hx of radiation for cervical cancer   PT Frequency 2x / week   PT Duration 8 weeks   PT Treatment/Interventions Manual techniques;Therapeutic exercise;Therapeutic activities;Taping;Patient/family education;Passive range of motion;DME Instruction;Scar mobilization;ADLs/Self Care Home Management   PT Next Visit Plan  Continue P/AA/A/ROM of both shoulders.  Progress scapular and shoulder strengthening, Assess for indep with [place] and supine scapular exercises   PT Home Exercise Plan Rolling ball up wall; supine cane exercises on her bed at home with more room than in clinic; and neural tension stretch on wall, [place] exercises, supine scap stabilization   Consulted and Agree with Plan of Care Patient      Patient will benefit from skilled therapeutic intervention in order to improve the following deficits and impairments:  Postural dysfunction, Decreased range of motion, Increased muscle spasms, Pain, Impaired UE functional use, Increased fascial restricitons, Decreased strength, Decreased knowledge of use of DME, Decreased knowledge of precautions, Decreased scar mobility  Visit Diagnosis: Stiffness of left shoulder, not elsewhere classified  Stiffness of right shoulder, not elsewhere classified  Pain in left shoulder  Pain in right shoulder     Problem List Patient Active Problem List   Diagnosis Date Noted  . Breast cancer, left ([place]) 12/11/2015  . Genetic testing 11/27/2015  . Cancer of corpus uteri, except isthmus  ([place]) 11/13/2015  . Breast cancer of upper-inner quadrant of left female breast ([place])   . Dehydration 10/09/2015  . Acute bacterial bronchitis 06/04/2015  . Hydronephrosis, right 03/19/2015  . Pelvic pain in female 03/19/2015  . Rash, skin 03/06/2015  . Diarrhea 02/20/2015  . Fever 12/30/2014  . Sepsis ([place]) 12/30/2014  . UTI (lower urinary tract infection) 12/30/2014  . Diabetes mellitus type 2, controlled ([place]) 12/30/2014  . Chronic anemia 12/30/2014  . Urinary tract infectious disease   . Hypomagnesemia 12/11/2014  . Hypokalemia 12/11/2014  . Cellulitis of left abdominal wall near colostomy 12/03/2014  . Reactive thrombocytosis 11/29/2014  . Pancytopenia, acquired ([place]) 11/26/2014  . Acute respiratory failure with hypoxia ([place]) 11/20/2014  . Morbid obesity ([place]) 11/20/2014  . Endotracheally intubated   . 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 Abdominal pain 09/09/2014  . Postmenopausal bleeding 09/05/2014  . History of ovarian & endometrial cancer 07/17/2012  . History of endometrial cancer 05/10/2012  . Malignant neoplasm of corpus uteri, except isthmus ([place]) 05/10/2012  . Malignant neoplasm of ovary ([place]) 05/10/2012  . Chronic neutropenia ([place]) 12/14/2011  . Hypothyroidism 12/14/2011  . DM type 2 (diabetes mellitus, type 2) ([place]) 12/14/2011  . Benign essential HTN 12/14/2011    Alexia Freestone 02/17/2016, 5:35 PM  [place], [place], [address] Phone: [phone]   Fax:  [phone]  Name: [name] MRN: [id number] Date of Birth: [birth year]    Allyson Sabal, PT 02/17/2016 5:35 PM

## 2016-02-17 NOTE — Patient Instructions (Signed)
Over Head Pull: Narrow Grip     [place] [phone]   On back, knees bent, feet flat, band across thighs, elbows straight but relaxed. Pull hands apart (start). Keeping elbows straight, bring arms up and over head, hands toward floor. Keep pull steady on band. Hold momentarily. Return slowly, keeping pull steady, back to start. Repeat __10_ times. Band color ___Yellow___   Side Pull: Double Arm   On back, knees bent, feet flat. Arms perpendicular to body, shoulder level, elbows straight but relaxed. Pull arms out to sides, elbows straight. Resistance band comes across collarbones, hands toward floor. Hold momentarily. Slowly return to starting position. Repeat _10__ times. Band color _yellow____   Sash   On back, knees bent, feet flat, left hand on left hip, right hand above left. Pull right arm DIAGONALLY (hip to shoulder) across chest. Bring right arm along head toward floor. Hold momentarily. Slowly return to starting position. Repeat on opposite side.  Repeat __10_ times. Band color __Yellow____   Shoulder Rotation: Double Arm   On back, knees bent, feet flat, elbows tucked at sides, bent 90, hands palms up. Pull hands apart and down toward floor, keeping elbows near sides. Hold momentarily. Slowly return to starting position. Repeat _10__ times. Band color __Yellow____

## 2016-02-18 ENCOUNTER — Ambulatory Visit: Payer: BLUE CROSS/BLUE SHIELD | Admitting: Physical Therapy

## 2016-02-18 DIAGNOSIS — M25611 Stiffness of right shoulder, not elsewhere classified: Secondary | ICD-10-CM | POA: Diagnosis not present

## 2016-02-18 DIAGNOSIS — M25511 Pain in right shoulder: Secondary | ICD-10-CM | POA: Diagnosis not present

## 2016-02-18 DIAGNOSIS — M25512 Pain in left shoulder: Secondary | ICD-10-CM | POA: Diagnosis not present

## 2016-02-18 DIAGNOSIS — M25612 Stiffness of left shoulder, not elsewhere classified: Secondary | ICD-10-CM | POA: Diagnosis not present

## 2016-02-18 NOTE — Therapy (Signed)
[place], [place], [address] Phone: [phone]   Fax:  [phone]  Physical Therapy Treatment  Patient Details  Name: [name] MRN: [id number] Date of Birth: 07/23/1956 Referring Provider: Barry Dienes  Encounter Date: 02/18/2016      PT End of Session - 02/18/16 1229    Visit Number 10   Number of Visits 17   Date for PT Re-Evaluation 03/15/16   PT Start Time 1115   PT Stop Time 1155   PT Time Calculation (min) 40 min   Activity Tolerance Patient tolerated treatment well   Behavior During Therapy [facility] for tasks assessed/performed      Past Medical History  Diagnosis Date  . Benign essential HTN 12/14/2011  . Multiple thyroid nodules     Managed by Dr. Harlow Asa  . Dysuria   . Radiation-induced dermatitis     contact dermatitis , radiation completed, rash only on ankles now.  . History of gastric polyp     2014  duodenum  . History of radiation therapy     01-29-2015 to 03-10-2015  pelvis 50.4Gy  . Hypothyroidism   . DM type 2 (diabetes mellitus, type 2) ([place])   . Hiatal hernia   . Anemia in neoplastic disease   . Chronic idiopathic neutropenia (HCC)     presumed related to chemotherapy March 2006--- followed by dr Alvy Bimler   . Ureteral stricture, right   . S/P colostomy ([place])   . Hydronephrosis   . History of renal stent   . Neutropenia ([place])   . Complication of anesthesia     PONV  . GERD (gastroesophageal reflux disease)     takes omeprazole  . Wears glasses   . Seasonal allergies   . History of bronchitis   . 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 Breast cancer, left ([place]) 2017  . PONV (postoperative nausea and vomiting)     "scopolamine patch works for me"    Past Surgical History  Procedure Laterality Date  . Appendectomy    . Tonsillectomy    . Eye  surgery      pytosis of eyelids-child  . Colonoscopy with propofol N/A 08/21/2013    Procedure: COLONOSCOPY WITH PROPOFOL;  Surgeon: Cleotis Nipper, MD;  [facility]: [facility];  Service: Endoscopy;  Laterality: N/A;  . Eus N/A 10/02/2014    Procedure: LOWER ENDOSCOPIC ULTRASOUND (EUS);  Surgeon: Arta Silence, MD;  Location: [facility];  Service: Endoscopy;  Laterality: N/A;  . Robotic assisted lap vaginal hysterectomy N/A 11/19/2014    Procedure: ROBOTIC LYSIS OF ADHESIONS, CONVERTED TO LAPAROTOMY RADICAL UPPER VAGINECTOMY,LOW ANTERIOR BOWEL RESECTION, COLOSTOMY, BILATERAL URETERAL STENT PLACEMENT AND CYSTONOMY CLOSURE;  Surgeon: Everitt Amber, MD;  [facility]: [facility];  Service: Gynecology;  Laterality: N/A;  . Ostomy N/A 11/19/2014    Procedure: OSTOMY;  Surgeon: Michael [name], MD;  [facility];  Service: General;  Laterality: N/A;  . Colostomy takedown N/A 12/04/2014    Procedure: LAPROSCOPIC LYSIS OF ADHESIONS, SPLENIC MOBILIZATION, RELOCATION OF COLOSTOMY, DEBRIDEMENT INITIAL COLOSTOMY SITE;  Surgeon: Michael [name], MD;  [facility];  Service: General;  Laterality: N/A;  . Cystoscopy with retrograde pyelogram, ureteroscopy and stent placement Right 03/20/2015    Procedure: CYSTOSCOPY WITH RETROGRADE PYELOGRAM, URETEROSCOPY WITH BALLOON DILATION AND STENT PLACEMENT ON RIGHT;  Surgeon: Alexis Frock, MD;  Location:  [facility];  Service: Urology;  Laterality: Right;  . Excision soft tissue mass right foreman  12-08-2006  . Abdominal hysterectomy  2006    staging for Ovarian cancer  . Laparoscopic cholecystectomy    . Cystoscopy with retrograde pyelogram, ureteroscopy and stent placement Right 05/02/2015    Procedure: CYSTOSCOPY WITH RIGHT RETROGRADE PYELOGRAM,  DIAGNOSTIC URETEROSCOPY AND STENT PULL ;  Surgeon: Alexis Frock, MD;  Location[facility];  Service: Urology;  Laterality: Right;  . Cystoscopy with retrograde pyelogram, ureteroscopy and stent  placement Right 09/05/2015    Procedure: CYSTOSCOPY WITH RETROGRADE PYELOGRAM,  AND STENT PLACEMENT;  Surgeon: Alexis Frock, MD;  [facility];  Service: Urology;  Laterality: Right;  . Cystoscopy w/ retrogrades Right 11/21/2015    Procedure: CYSTOSCOPY WITH RETROGRADE PYELOGRAM;  Surgeon: Alexis Frock, MD;  [facility];  Service: Urology;  Laterality: Right;  . Cystoscopy w/ ureteral stent placement Right 11/21/2015    Procedure: CYSTOSCOPY WITH STENT REPLACEMENT;  Surgeon: Alexis Frock, MD;  [facility];  Service: Urology;  Laterality: Right;  . Mastectomy Right 12/11/2015    PROPHYLACTIC   . Mastectomy complete / simple w/ sentinel node biopsy Left 12/11/2015    AXILLARY SENTINEL LYMPH NODE BIOPSY   . Mastectomy w/ sentinel node biopsy Bilateral 12/11/2015    Procedure: RIGHT PROPHYLACTIC MASTECTOMY, LEFT TOTAL MASTECTOMY WITH LEFT AXILLARY SENTINEL LYMPH NODE BIOPSY;  Surgeon: Stark Klein, MD;  Location: [facility];  Service: General;  Laterality: Bilateral;  . Breast reconstruction with placement of tissue expander and flex hd (acellular hydrated dermis) Bilateral 12/11/2015    Procedure: BILATERAL BREAST RECONSTRUCTION WITH PLACEMENT OF TISSUE EXPANDERS;  Surgeon: Irene Limbo, MD;  Location: [facility];  Service: Plastics;  Laterality: Bilateral;  . Incision and drainage of wound Bilateral 12/26/2015    Procedure: DEBRIDEMENT OF BILATERAL MASTECTOMY FLAPS;  Surgeon: Irene Limbo, MD;  Location: [facility];  Service: Plastics;  Laterality: Bilateral;  . Tissue expander filling Bilateral 12/26/2015    Procedure: EXPANSION OF BILATERAL CHEST TISSUE EXPANDERS (60 mL- Right; 75 mL- Left);  Surgeon: Irene Limbo, MD;  Location: [facility];  Service: Plastics;  Laterality: Bilateral;    There were no vitals filed for this visit.      Subjective Assessment - 02/18/16 1133    Subjective Doing OK.  only occasional pain when she pulls up in the bed     Pertinent History Breast cancer of upper-inner quadrant of left female breast (HCC) Stage IIA (T2, N0, M0) ER,PR positive Her2 neg, s/p bilateral mastectomy, pt has hx of cervical cancer with recurrence and was treated with chemo and radiation, she is diabetic  Plans to have implants in Sept   Patient Stated Goals get full range of motion back, I have to ask my husband to help me open jars   Currently in Pain? Yes   Pain Score 2    Pain Frequency Intermittent                         [facility] Adult PT Treatment/Exercise - 02/18/16 0001    Shoulder Exercises: Supine   Horizontal ABduction Strengthening;Both;10 reps;Theraband   Theraband Level (Shoulder Horizontal ABduction) Level 2 (Red)   External Rotation Strengthening;Both;10 reps;Theraband   Theraband Level (Shoulder External Rotation) Level 2 (Red)   Flexion Strengthening;Both;10 reps;Theraband  with narrow and then wide grip   Theraband Level (Shoulder Flexion) Level 1 (Yellow)   Other  Supine Exercises D2 x 10 reps bilat with red theraband   Other Supine Exercises on blue foam roller for anterior chest stretch and core stabalization by raiseing up arms and legs one at a time and opposite arm and leg    Shoulder Exercises: Seated   External Rotation Strengthening;Both;10 reps;Theraband   Theraband Level (Shoulder External Rotation) Level 2 (Red)   Other Seated Exercises neck range of motion with thoracic extension. backward shoulder rolls    Other Seated Exercises bilateral scapular retraction.    Shoulder Exercises: Sidelying   ABduction AROM;Left;5 reps   Other Sidelying Exercises horizontal abduction and thoracic backward rotation for chest and upper back stretch    Other Sidelying Exercises small circles with hand pointing up to ceiling.    Shoulder Exercises: Standing   External Rotation Strengthening;Both;10 reps;Theraband   Theraband Level (Shoulder External Rotation) Level 2 (Red)   Internal Rotation  Strengthening;Both;10 reps;Theraband   Theraband Level (Shoulder Internal Rotation) Level 2 (Red)   Flexion Strengthening;Both;10 reps;Theraband   Theraband Level (Shoulder Flexion) Level 2 (Red)   Extension Strengthening;Both;10 reps;Theraband   Theraband Level (Shoulder Extension) Level 2 (Red)   Shoulder Exercises: Pulleys   Flexion 2 minutes   ABduction 2 minutes   Manual Therapy   Passive ROM In supine with stretch to tolerance for ER, abduction, and flexion to right and left shoulders                   Short Term Clinic Goals - 02/12/16 1458    CC Short Term Goal  #1   Title Pt will be independent in verbalizing lymphedema risk reduction practices.    Baseline 02/12/16- reviewed pink sheet with pt   Time 4   Period Weeks   Status On-going   CC Short Term Goal  #2   Title Pt will demonstrate 40 degrees of L shoulder external rotation to allow pt to wash her hair.   Baseline unable to assess due to pain, 02/12/16- 85 degrees   Time 4   Period Weeks   Status Achieved   CC Short Term Goal  #3   Title Pt to demonstrate 150 degrees of left shoulder flexion to allow her to reach items on shelves   Baseline 112, 02/12/16 - 145 degrees   Time 4   Period Weeks   Status On-going   CC Short Term Goal  #4   Title Pt to demonstrate 120 degrees of left shoulder abduction to allow pt to reach items out to sides   Baseline 85 at eval, 143 on 02/05/2016   Status Achieved             Long Term Clinic Goals - 02/12/16 1504    CC Long Term Goal  #1   Title Pt to be independent with a home exercise program for strengthening and stretching.    Time 8   Period Weeks   Status On-going   CC Long Term Goal  #2   Title Pt to demonstrate 70 degrees of L shoulder IR and ER rotation to allow her to complete ADLs without assist and to wash her hair   Baseline 02/12/16- pt demonstrates 75 degrees of IR and 85 degrees of Er   Time 8   Period Weeks   Status Achieved   CC Long Term  Goal  #3   Title Pt to demonstrate 160 degrees of left and right shoulder flexion to allow her to reach items on shelves   Baseline  R 136 deg, L 112 deg, 02/12/16  L 145 degrees, R 130 degrees   Time 8   Period Weeks   Status On-going   CC Long Term Goal  #4   Title Pt to demonstrate 145 degrees of bilateral shoulder abduction to allow pt to reach items out to sides   Baseline R 99, L 85 deg, 02/12/16- L 169, R 171   Time 8   Period Weeks   Status Achieved            Plan - 02/18/16 1229    Clinical Impression Statement Pt continues to improve but is limited in extremes of range of motion, she may benefit from more work on thoracic mobility to improve shoulder end range of motion    Clinical Impairments Affecting Rehab Potential hx of radiation for cervical cancer   PT Next Visit Plan  Continue P/AA/A/ROM of both shoulders and add throracic mobility and stretching   Progress scapular and shoulder strengthening, Assess for indep with [place] and supine scapular exercises   Consulted and Agree with Plan of Care Patient      Patient will benefit from skilled therapeutic intervention in order to improve the following deficits and impairments:  Postural dysfunction, Decreased range of motion, Increased muscle spasms, Pain, Impaired UE functional use, Increased fascial restricitons, Decreased strength, Decreased knowledge of use of DME, Decreased knowledge of precautions, Decreased scar mobility  Visit Diagnosis: Stiffness of left shoulder, not elsewhere classified  Stiffness of right shoulder, not elsewhere classified  Pain in left shoulder  Pain in right shoulder     Problem List Patient Active Problem List   Diagnosis Date Noted  . Breast cancer, left ([place]) 12/11/2015  . Genetic testing 11/27/2015  . Cancer of corpus uteri, except isthmus ([place]) 11/13/2015  . Breast cancer of upper-inner quadrant of left female breast ([place])   . Dehydration 10/09/2015  . Acute bacterial  bronchitis 06/04/2015  . Hydronephrosis, right 03/19/2015  . Pelvic pain in female 03/19/2015  . Rash, skin 03/06/2015  . Diarrhea 02/20/2015  . Fever 12/30/2014  . Sepsis ([place]) 12/30/2014  . UTI (lower urinary tract infection) 12/30/2014  . Diabetes mellitus type 2, controlled ([place]) 12/30/2014  . Chronic anemia 12/30/2014  . Urinary tract infectious disease   . Hypomagnesemia 12/11/2014  . Hypokalemia 12/11/2014  . Cellulitis of left abdominal wall near colostomy 12/03/2014  . Reactive thrombocytosis 11/29/2014  . Pancytopenia, acquired ([place]) 11/26/2014  . Acute respiratory failure with hypoxia ([place]) 11/20/2014  . Morbid obesity ([place]) 11/20/2014  . Endotracheally intubated   . 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 Abdominal pain 09/09/2014  . Postmenopausal bleeding 09/05/2014  . History of ovarian & endometrial cancer 07/17/2012  . History of endometrial cancer 05/10/2012  . Malignant neoplasm of corpus uteri, except isthmus ([place]) 05/10/2012  . Malignant neoplasm of ovary ([place]) 05/10/2012  . Chronic neutropenia ([place]) 12/14/2011  . Hypothyroidism 12/14/2011  . DM type 2 (diabetes mellitus, type 2) ([place]) 12/14/2011  . Benign essential HTN 12/14/2011   Donato Heinz. Owens Shark PT   Norwood Levo 02/18/2016, 12:32 PM  [facility] [place], [place], [address] Phone: [phone]   Fax:  [phone]  Name: [name] MRN: [id number] Date of Birth: 1956-06-10

## 2016-02-19 ENCOUNTER — Ambulatory Visit: Payer: BLUE CROSS/BLUE SHIELD | Admitting: Physical Therapy

## 2016-02-22 IMAGING — CT CT ABD-PELV W/ CM
2 of 5 series · 15 of 46 positions shown, 17 images · IV contrast (OMNIPAQUE)
Comparison: Most recent CT abdomen/pelvis from 01/27/2015.

CLINICAL DATA: 59-year-old female with a history of
ovarian/endometrial cancer diagnosed in 5003, with recurrence in December 2014, currently on radiation therapy. Prior cholecystectomy, TAH and
subtotal colectomy with colostomy. Difficulty with KLPIGBB duration,
with suprapubic in bilateral lower back pain for 3 weeks.

EXAM:
CT ABDOMEN AND PELVIS WITH CONTRAST
TECHNIQUE: Multidetector CT imaging of the abdomen and pelvis was performed
using the standard protocol following bolus administration of
intravenous contrast.
CONTRAST:  100mL OMNIPAQUE IOHEXOL 300 MG/ML  SOLN

[Series 2: rtn a/p with · axial · 0.76mm/px · z∈[-612,-197]mm · 12 of 93 slices shown, 14 images]
[im 5/93  soft-tissue]
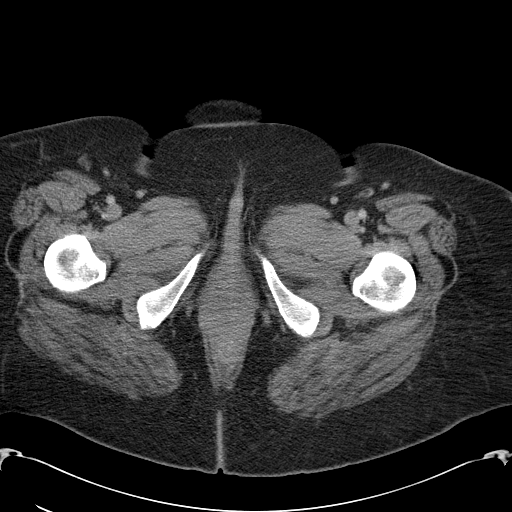
[im 5/93  bone]
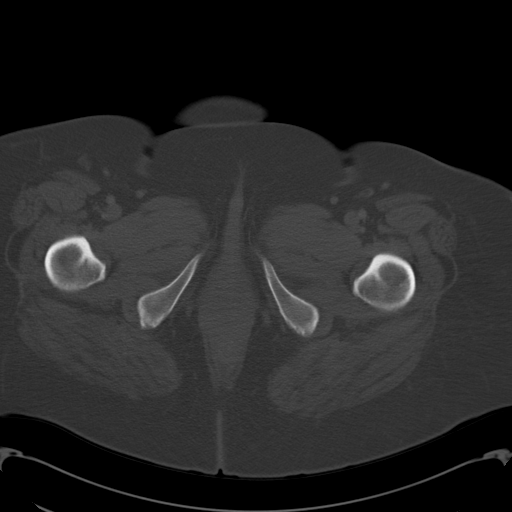
[im 15/93  soft-tissue]
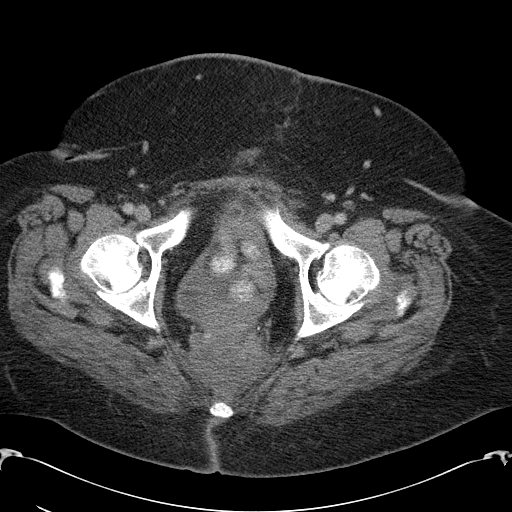
[im 20/93  soft-tissue]
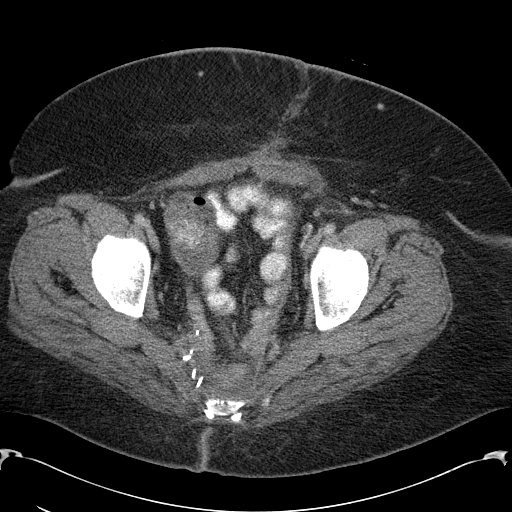
[im 30/93  soft-tissue]
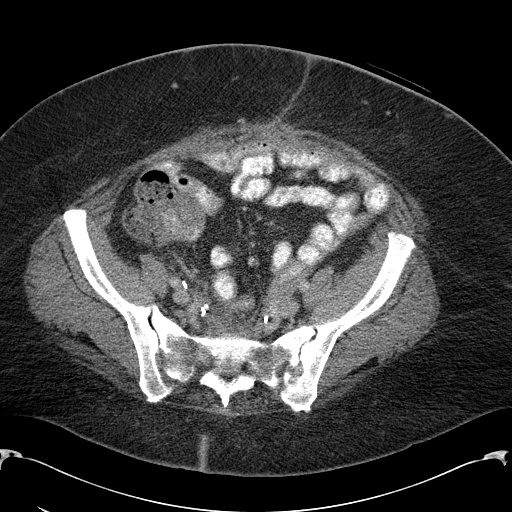
[im 34/93  soft-tissue]
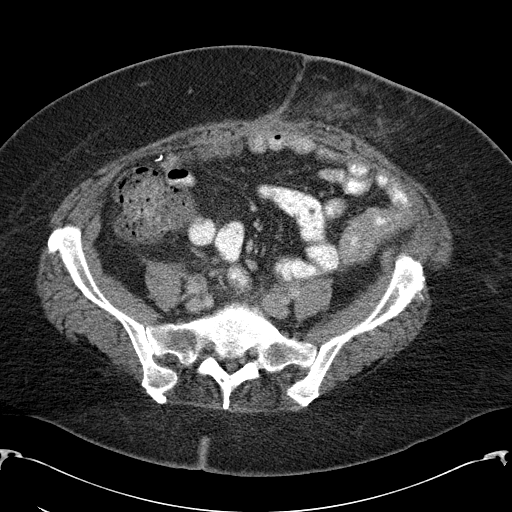
[im 44/93  soft-tissue]
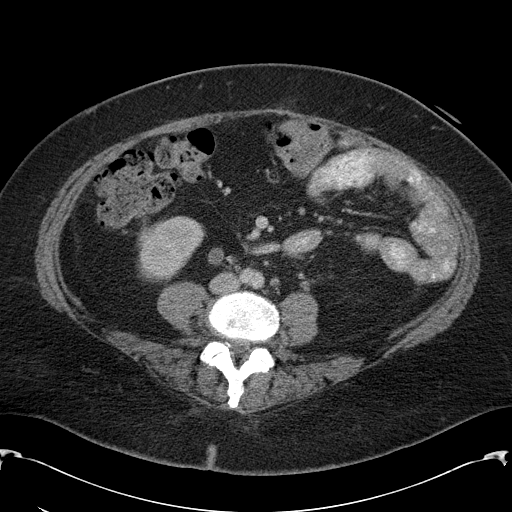
[im 49/93  soft-tissue]
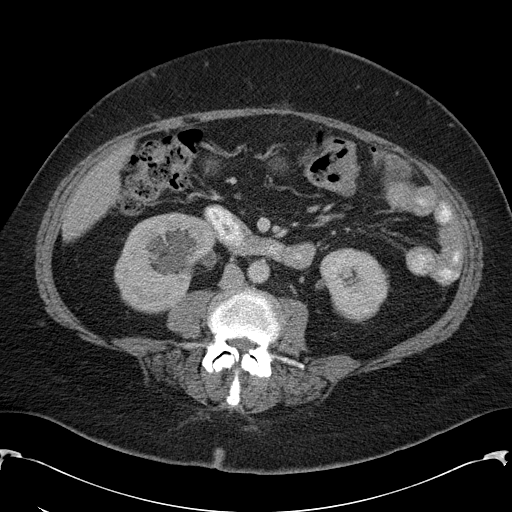
[im 59/93  soft-tissue]
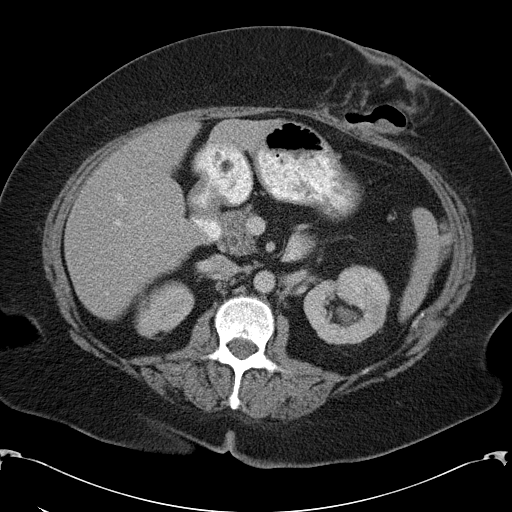
[im 63/93  soft-tissue]
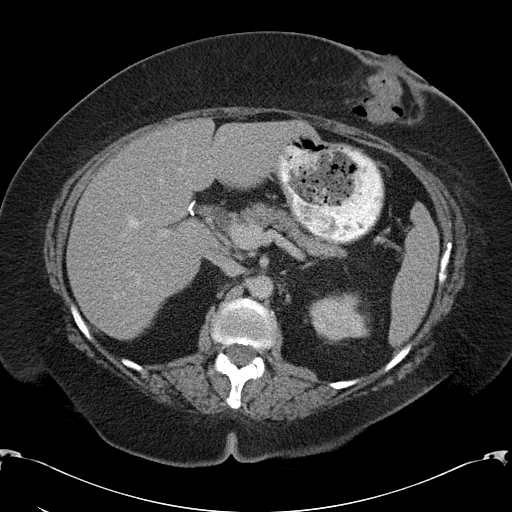
[im 63/93  bone]
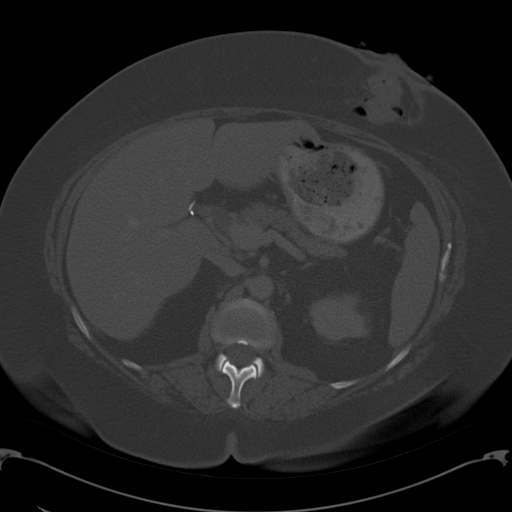
[im 73/93  soft-tissue]
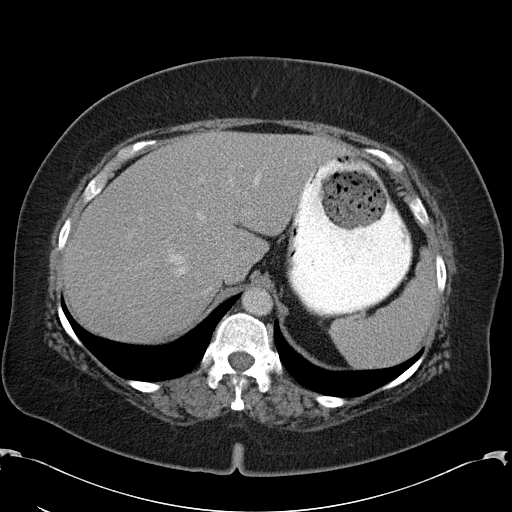
[im 78/93  soft-tissue]
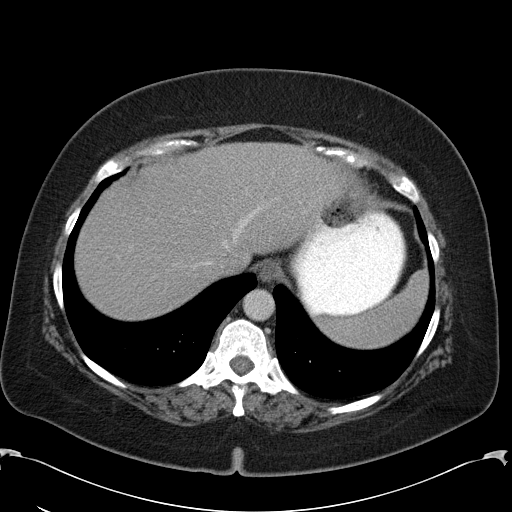
[im 88/93  soft-tissue]
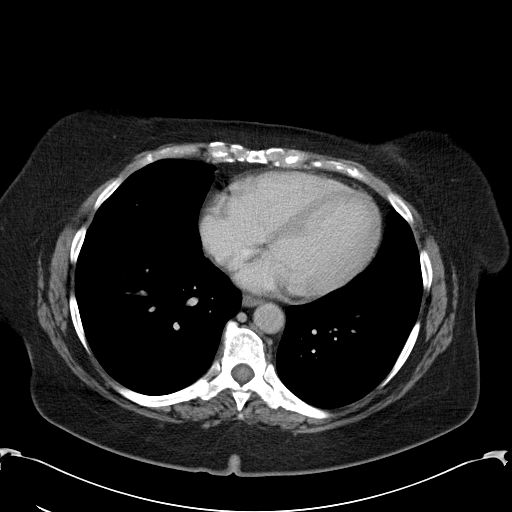

[Series 602: coronal images · coronal · 0.91mm/px · 3 of 89 slices shown]
[im 30/89  soft-tissue]
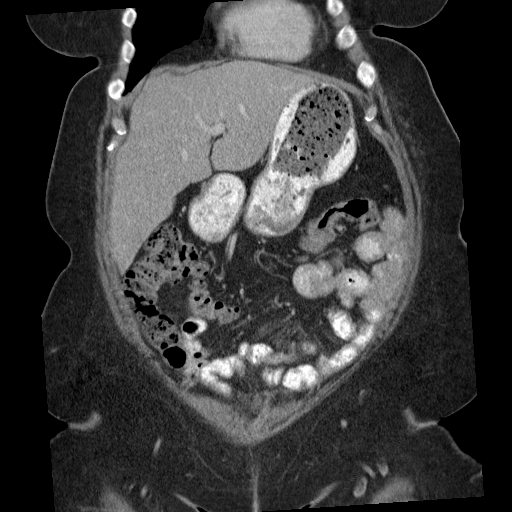
[im 40/89  soft-tissue]
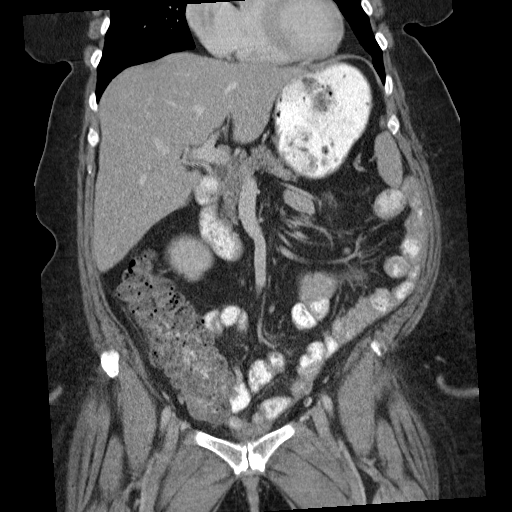
[im 49/89  soft-tissue]
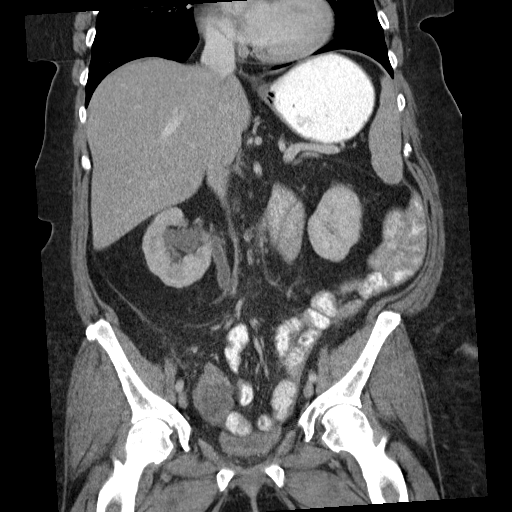

[15 of 46 positions shown; findings below may reference images not displayed]

FINDINGS: Lower chest:  Clear lung bases.

Hepatobiliary: Diffuse hepatic steatosis. Otherwise normal liver,
with no liver mass. Status post cholecystectomy. No intrahepatic
biliary ductal dilatation. Mildly dilated common bile duct (8 mm
diameter), stable and within expected post cholecystectomy limits.
No radiopaque choledocholithiasis .

Pancreas: Normal.

Spleen: Normal.

Adrenals/Urinary Tract: Normal adrenals. Moderate right
hydroureteronephrosis, with interval worsening (right renal pelvis
measures 18 mm diameter, previously 12 mm), status post interval
removal of the right nephroureteral stent. The right ureter is
obstructed at the level of the pelvic ureter where the right ureter
crosses over the right iliac vessels, in association with
periureteral soft tissue stranding, which is not appreciably
changed. There is stable urothelial wall thickening in the dilated
right renal collecting system. In there is minimal residual fullness
in the left renal collecting system without overt left
hydronephrosis, which is improved in the interval status post
removal of the left nephroureteral stent. The left ureter is normal
caliber. There is stable ill defined periureteral soft tissue
stranding involving the distal third of the left ureter. No renal
masses. No renal stones. Nearly collapsed and grossly normal urinary
bladder.

Stomach/Bowel: Normal stomach. Normal caliber small bowel, with no
appreciable small bowel wall thickening. Oral contrast traverses to
the colon. Status post appendectomy. Status post distal
colectomy/low anterior resection of the rectum with left-sided end
colostomy. No evidence of colonic wall thickening. No parastomal
hernia.

Vascular/Lymphatic: No lymphadenopathy in the abdomen or pelvis.
Normal caliber abdominal aorta.

Reproductive: Status post hysterectomy stable abnormal ill-defined
soft tissue stranding at the vaginal cuff extending into the
presacral region and confluent with the bilateral distal ureters,
with no definable fluid collection. No discrete adnexal mass.

Other: No pneumoperitoneum, ascites or focal fluid collection.

Musculoskeletal: Moderate degenerative changes in the visualized
thoracolumbar spine, most prominent at L5-S1. No suspicious focal
osseous lesion. Stable postsurgical changes in the ventral abdominal
pelvic wall.
IMPRESSION: 1. Status post interval removal of the bilateral nephroureteral
stents. Worsening moderate right hydroureteronephrosis. Resolved
left hydroureteronephrosis.
2. Stable abnormal infiltrative soft tissue stranding at the vaginal
cuff extending into the presacral space and involving the distal
thirds of the ureters bilaterally, in keeping with tumor recurrence.
No discrete mass or fluid collection.
3. Status post distal colectomy with left and colostomy. No evidence
of bowel obstruction or acute bowel inflammation.
4. Diffuse hepatic steatosis.

## 2016-02-24 ENCOUNTER — Encounter: Payer: Self-pay | Admitting: Physical Therapy

## 2016-02-24 ENCOUNTER — Ambulatory Visit: Payer: BLUE CROSS/BLUE SHIELD | Admitting: Physical Therapy

## 2016-02-24 DIAGNOSIS — M25611 Stiffness of right shoulder, not elsewhere classified: Secondary | ICD-10-CM

## 2016-02-24 DIAGNOSIS — M25511 Pain in right shoulder: Secondary | ICD-10-CM

## 2016-02-24 DIAGNOSIS — M25512 Pain in left shoulder: Secondary | ICD-10-CM | POA: Diagnosis not present

## 2016-02-24 DIAGNOSIS — M25612 Stiffness of left shoulder, not elsewhere classified: Secondary | ICD-10-CM

## 2016-02-24 NOTE — Therapy (Signed)
[name], [name], [address] Phone: [phone]   Fax:  [phone]  Physical Therapy Treatment  Patient Details  Name: [name] MRN: [id number] Date of Birth: [birth year] Referring Provider: Barry Dienes  Encounter Date: 02/24/2016      PT End of Session - 02/24/16 1610    Visit Number 11   Number of Visits 17   Date for PT Re-Evaluation 03/15/16   PT Start Time 3664   PT Stop Time 1521   PT Time Calculation (min) 42 min   Activity Tolerance Patient tolerated treatment well   Behavior During Therapy [facility] for tasks assessed/performed      Past Medical History:  Diagnosis Date  . Anemia in neoplastic disease   . Benign essential HTN 12/14/2011  . Breast cancer, left ([place]) 2017  . 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 Chronic idiopathic neutropenia (HCC)    presumed related to chemotherapy March 2006--- followed by dr Alvy Bimler   . Complication of anesthesia    PONV  . DM type 2 (diabetes mellitus, type 2) ([place])   . Dysuria   . GERD (gastroesophageal reflux disease)    takes omeprazole  . Hiatal hernia   . History of bronchitis   . History of gastric polyp    2014  duodenum  . History of radiation therapy    01-29-2015 to 03-10-2015  pelvis 50.4Gy  . History of renal stent   . Hydronephrosis   . Hypothyroidism   . Multiple thyroid nodules    Managed by Dr. Harlow Asa  . Neutropenia ([place])   . PONV (postoperative nausea and vomiting)    "scopolamine patch works for me"  . Radiation-induced dermatitis    contact dermatitis , radiation completed, rash only on ankles now.  . S/P colostomy ([place])   . Seasonal allergies   . Ureteral stricture, right   . Wears glasses     Past Surgical History:  Procedure Laterality Date  . ABDOMINAL HYSTERECTOMY  2006   staging for Ovarian  cancer  . APPENDECTOMY    . BREAST RECONSTRUCTION WITH PLACEMENT OF TISSUE EXPANDER AND FLEX HD (ACELLULAR HYDRATED DERMIS) Bilateral 12/11/2015   Procedure: BILATERAL BREAST RECONSTRUCTION WITH PLACEMENT OF TISSUE EXPANDERS;  Surgeon: Irene Limbo, MD;  Location: [facility];  Service: Plastics;  Laterality: Bilateral;  . COLONOSCOPY WITH PROPOFOL N/A 08/21/2013   Procedure: COLONOSCOPY WITH PROPOFOL;  Surgeon: Cleotis Nipper, MD;  Location: [facility];  Service: Endoscopy;  Laterality: N/A;  . COLOSTOMY TAKEDOWN N/A 12/04/2014   Procedure: LAPROSCOPIC LYSIS OF ADHESIONS, SPLENIC MOBILIZATION, RELOCATION OF COLOSTOMY, DEBRIDEMENT INITIAL COLOSTOMY SITE;  Surgeon: Michael [name], MD;  Location: [facility];  Service: General;  Laterality: N/A;  . CYSTOSCOPY W/ RETROGRADES Right 11/21/2015   Procedure: CYSTOSCOPY WITH RETROGRADE PYELOGRAM;  Surgeon: Alexis Frock, MD;  [facility]: [facility];  Service: Urology;  Laterality: Right;  . CYSTOSCOPY W/ URETERAL STENT PLACEMENT Right 11/21/2015   Procedure: CYSTOSCOPY WITH STENT REPLACEMENT;  Surgeon: Alexis Frock, MD;  [facility];  Service: Urology;  Laterality: Right;  . CYSTOSCOPY WITH RETROGRADE PYELOGRAM, URETEROSCOPY AND STENT PLACEMENT Right 03/20/2015   Procedure: CYSTOSCOPY WITH RETROGRADE PYELOGRAM, URETEROSCOPY WITH BALLOON DILATION AND STENT PLACEMENT ON RIGHT;  Surgeon: Alexis Frock, MD;  Location[facility];  Service: Urology;  Laterality: Right;  . CYSTOSCOPY WITH RETROGRADE PYELOGRAM,  URETEROSCOPY AND STENT PLACEMENT Right 05/02/2015   Procedure: CYSTOSCOPY WITH RIGHT RETROGRADE PYELOGRAM,  DIAGNOSTIC URETEROSCOPY AND STENT PULL ;  Surgeon: Alexis Frock, MD;  Location: [facility];  Service: Urology;  Laterality: Right;  . CYSTOSCOPY WITH RETROGRADE PYELOGRAM, URETEROSCOPY AND STENT PLACEMENT Right 09/05/2015   Procedure: CYSTOSCOPY WITH RETROGRADE PYELOGRAM,  AND STENT PLACEMENT;  Surgeon: Alexis Frock, MD;   [facility];  Service: Urology;  Laterality: Right;  . EUS N/A 10/02/2014   Procedure: LOWER ENDOSCOPIC ULTRASOUND (EUS);  Surgeon: Arta Silence, MD;  Location: [facility];  Service: Endoscopy;  Laterality: N/A;  . EXCISION SOFT TISSUE MASS RIGHT FOREMAN  12-08-2006  . EYE SURGERY     pytosis of eyelids-child  . INCISION AND DRAINAGE OF WOUND Bilateral 12/26/2015   Procedure: DEBRIDEMENT OF BILATERAL MASTECTOMY FLAPS;  Surgeon: Irene Limbo, MD;  Location: [place];  Service: Plastics;  Laterality: Bilateral;  . LAPAROSCOPIC CHOLECYSTECTOMY    . MASTECTOMY Right 12/11/2015   PROPHYLACTIC   . MASTECTOMY COMPLETE / SIMPLE W/ SENTINEL NODE BIOPSY Left 12/11/2015   AXILLARY SENTINEL LYMPH NODE BIOPSY   . MASTECTOMY W/ SENTINEL NODE BIOPSY Bilateral 12/11/2015   Procedure: RIGHT PROPHYLACTIC MASTECTOMY, LEFT TOTAL MASTECTOMY WITH LEFT AXILLARY SENTINEL LYMPH NODE BIOPSY;  Surgeon: Stark Klein, MD;  Location: [place];  Service: General;  Laterality: Bilateral;  . OSTOMY N/A 11/19/2014   Procedure: OSTOMY;  Surgeon: Michael [name], MD;  [facility];  Service: General;  Laterality: N/A;  . ROBOTIC ASSISTED LAP VAGINAL HYSTERECTOMY N/A 11/19/2014   Procedure: ROBOTIC LYSIS OF ADHESIONS, CONVERTED TO LAPAROTOMY RADICAL UPPER VAGINECTOMY,LOW ANTERIOR BOWEL RESECTION, COLOSTOMY, BILATERAL URETERAL STENT PLACEMENT AND CYSTONOMY CLOSURE;  Surgeon: Everitt Amber, MD;  Location[facility];  Service: Gynecology;  Laterality: N/A;  . TISSUE EXPANDER FILLING Bilateral 12/26/2015   Procedure: EXPANSION OF BILATERAL CHEST TISSUE EXPANDERS (60 mL- Right; 75 mL- Left);  Surgeon: Irene Limbo, MD;  Location: [place];  Service: Plastics;  Laterality: Bilateral;  . TONSILLECTOMY      There were no vitals filed for this visit.      Subjective Assessment - 02/24/16 1443    Subjective I am doing better with my exercises. I am not getting confused with the doorway ones anymore.     Pertinent History Breast cancer of upper-inner quadrant of left female breast ([place]) Stage IIA (T2, N0, M0) ER,PR positive Her2 neg, s/p bilateral mastectomy, pt has hx of cervical cancer with recurrence and was treated with chemo and radiation, she is diabetic  Plans to have implants in Sept   Patient Stated Goals get full range of motion back, I have to ask my husband to help me open jars   Currently in Pain? Yes   Pain Score 2    Pain Location Shoulder   Pain Orientation Right;Left   Pain Descriptors / Indicators Aching   Pain Type Chronic pain                         [facility] Adult PT Treatment/Exercise - 02/24/16 0001      Shoulder Exercises: Supine   Horizontal ABduction Strengthening;Both;10 reps;Theraband   Theraband Level (Shoulder Horizontal ABduction) Level 3 (Green)   External Rotation Strengthening;Both;10 reps;Theraband   Theraband Level (Shoulder External Rotation) Level 3 (Green)   Flexion Strengthening;Both;10 reps;Theraband  with narrow and then wide grip   Theraband Level (Shoulder Flexion) Level 3 (Green)   Other Supine Exercises D2  x 10 reps bilat with green theraband     Shoulder Exercises: Seated   Other Seated Exercises neck range of motion with thoracic extension. backward shoulder rolls      Shoulder Exercises: Sidelying   ABduction AROM;Both;10 reps   Other Sidelying Exercises horizontal abduction and thoracic backward rotation for chest and upper back stretch    Other Sidelying Exercises small circles with hand pointing up to ceiling.      Shoulder Exercises: Standing   External Rotation Strengthening;Both;10 reps;Theraband   Theraband Level (Shoulder External Rotation) Level 3 (Green)   Internal Rotation Strengthening;Both;10 reps;Theraband   Theraband Level (Shoulder Internal Rotation) Level 3 (Green)   Flexion Strengthening;Both;10 reps;Theraband   Theraband Level (Shoulder Flexion) Level 3 (Green)   Extension Strengthening;Both;10  reps;Theraband   Theraband Level (Shoulder Extension) Level 3 (Green)   Other Standing Exercises standing with back on wall AROM into abduction, scaption, flexion x 10 reps each no resistance through full ROM     Shoulder Exercises: Pulleys   Flexion 2 minutes   ABduction 2 minutes     Manual Therapy   Passive ROM In supine with stretch to tolerance for ER, abduction, and flexion to right and left shoulders                   Short Term Clinic Goals - 02/12/16 1458      CC Short Term Goal  #1   Title Pt will be independent in verbalizing lymphedema risk reduction practices.    Baseline 02/12/16- reviewed pink sheet with pt   Time 4   Period Weeks   Status On-going     CC Short Term Goal  #2   Title Pt will demonstrate 40 degrees of L shoulder external rotation to allow pt to wash her hair.   Baseline unable to assess due to pain, 02/12/16- 85 degrees   Time 4   Period Weeks   Status Achieved     CC Short Term Goal  #3   Title Pt to demonstrate 150 degrees of left shoulder flexion to allow her to reach items on shelves   Baseline 112, 02/12/16 - 145 degrees   Time 4   Period Weeks   Status On-going     CC Short Term Goal  #4   Title Pt to demonstrate 120 degrees of left shoulder abduction to allow pt to reach items out to sides   Baseline 85 at eval, 143 on 02/05/2016   Status Achieved             Long Term Clinic Goals - 02/12/16 1504      CC Long Term Goal  #1   Title Pt to be independent with a home exercise program for strengthening and stretching.    Time 8   Period Weeks   Status On-going     CC Long Term Goal  #2   Title Pt to demonstrate 70 degrees of L shoulder IR and ER rotation to allow her to complete ADLs without assist and to wash her hair   Baseline 02/12/16- pt demonstrates 75 degrees of IR and 85 degrees of Er   Time 8   Period Weeks   Status Achieved     CC Long Term Goal  #3   Title Pt to demonstrate 160 degrees of left and right  shoulder flexion to allow her to reach items on shelves   Baseline R 136 deg, L 112 deg, 02/12/16  L 145 degrees, R  130 degrees   Time 8   Period Weeks   Status On-going     CC Long Term Goal  #4   Title Pt to demonstrate 145 degrees of bilateral shoulder abduction to allow pt to reach items out to sides   Baseline R 99, L 85 deg, 02/12/16- L 169, R 171   Time 8   Period Weeks   Status Achieved            Plan - 02/24/16 1611    Clinical Impression Statement Pt is demonstrating improved bilateral active shoulder ROM per visual estimate. She was instructed in shoulder AROM exercises today in standing. She is also tolerating PROM well. She has been performing her home exercise program and today the resistance band was increased from red to green.    Rehab Potential Good   Clinical Impairments Affecting Rehab Potential hx of radiation for cervical cancer   PT Frequency 2x / week   PT Duration 8 weeks   PT Treatment/Interventions Manual techniques;Therapeutic exercise;Therapeutic activities;Taping;Patient/family education;Passive range of motion;DME Instruction;Scar mobilization;ADLs/Self Care Home Management   PT Next Visit Plan  Continue P/AA/A/ROM of both shoulders and add throracic mobility and stretching   Progress scapular and shoulder strengthening,    PT Home Exercise Plan Rolling ball up wall; supine cane exercises on her bed at home with more room than in clinic; and neural tension stretch on wall, rockwood exercises, supine scap stabilization   Consulted and Agree with Plan of Care Patient      Patient will benefit from skilled therapeutic intervention in order to improve the following deficits and impairments:  Postural dysfunction, Decreased range of motion, Increased muscle spasms, Pain, Impaired UE functional use, Increased fascial restricitons, Decreased strength, Decreased knowledge of use of DME, Decreased knowledge of precautions, Decreased scar mobility  Visit  Diagnosis: Stiffness of left shoulder, not elsewhere classified  Stiffness of right shoulder, not elsewhere classified  Pain in left shoulder  Pain in right shoulder     Problem List Patient Active Problem List   Diagnosis Date Noted  . Breast cancer, left ([place]) 12/11/2015  . Genetic testing 11/27/2015  . Cancer of corpus uteri, except isthmus ([place]) 11/13/2015  . Breast cancer of upper-inner quadrant of left female breast ([place])   . Dehydration 10/09/2015  . Acute bacterial bronchitis 06/04/2015  . Hydronephrosis, right 03/19/2015  . Pelvic pain in female 03/19/2015  . Rash, skin 03/06/2015  . Diarrhea 02/20/2015  . Fever 12/30/2014  . Sepsis ([place]) 12/30/2014  . UTI (lower urinary tract infection) 12/30/2014  . Diabetes mellitus type 2, controlled ([place] [place]) 12/30/2014  . Chronic anemia 12/30/2014  . Urinary tract infectious disease   . Hypomagnesemia 12/11/2014  . Hypokalemia 12/11/2014  . Cellulitis of left abdominal wall near colostomy 12/03/2014  . Reactive thrombocytosis 11/29/2014  . Pancytopenia, acquired ([place]) 11/26/2014  . Acute respiratory failure with hypoxia ([place]) 11/20/2014  . Morbid obesity ([place]) 11/20/2014  . Endotracheally intubated   . 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 Abdominal pain 09/09/2014  . Postmenopausal bleeding 09/05/2014  . History of ovarian & endometrial cancer 07/17/2012  . History of endometrial cancer 05/10/2012  . Malignant neoplasm of corpus uteri, except isthmus ([place]) 05/10/2012  . Malignant neoplasm of ovary ([place]) 05/10/2012  . Chronic neutropenia ([place]) 12/14/2011  . Hypothyroidism 12/14/2011  . DM type 2 (diabetes mellitus, type 2) ([place]) 12/14/2011  . Benign essential HTN 12/14/2011    Quinlyn Tep L  Hollie Salk 02/24/2016, 4:13 PM  [place], [name], [address] Phone:  [phone]   Fax:  [phone]  Name: Lakenya Riendeau MRN: [id number] Date of Birth: October 06, 1955   Allyson Sabal, PT 02/24/16 4:13 PM

## 2016-02-26 ENCOUNTER — Ambulatory Visit: Payer: BLUE CROSS/BLUE SHIELD | Admitting: Physical Therapy

## 2016-02-26 ENCOUNTER — Encounter: Payer: Self-pay | Admitting: Family Medicine

## 2016-02-26 ENCOUNTER — Ambulatory Visit (INDEPENDENT_AMBULATORY_CARE_PROVIDER_SITE_OTHER): Payer: BLUE CROSS/BLUE SHIELD | Admitting: Family Medicine

## 2016-02-26 VITALS — BP 105/73 | HR 89 | Temp 98.4°F | Resp 16 | Ht 63.0 in | Wt 198.6 lb

## 2016-02-26 DIAGNOSIS — E038 Other specified hypothyroidism: Secondary | ICD-10-CM

## 2016-02-26 DIAGNOSIS — I10 Essential (primary) hypertension: Secondary | ICD-10-CM | POA: Diagnosis not present

## 2016-02-26 DIAGNOSIS — M25512 Pain in left shoulder: Secondary | ICD-10-CM

## 2016-02-26 DIAGNOSIS — N390 Urinary tract infection, site not specified: Secondary | ICD-10-CM | POA: Diagnosis not present

## 2016-02-26 DIAGNOSIS — M25611 Stiffness of right shoulder, not elsewhere classified: Secondary | ICD-10-CM

## 2016-02-26 DIAGNOSIS — R82998 Other abnormal findings in urine: Secondary | ICD-10-CM

## 2016-02-26 DIAGNOSIS — E785 Hyperlipidemia, unspecified: Secondary | ICD-10-CM | POA: Diagnosis not present

## 2016-02-26 DIAGNOSIS — N23 Unspecified renal colic: Secondary | ICD-10-CM

## 2016-02-26 DIAGNOSIS — M25612 Stiffness of left shoulder, not elsewhere classified: Secondary | ICD-10-CM | POA: Diagnosis not present

## 2016-02-26 DIAGNOSIS — M25511 Pain in right shoulder: Secondary | ICD-10-CM

## 2016-02-26 DIAGNOSIS — R1084 Generalized abdominal pain: Secondary | ICD-10-CM | POA: Diagnosis not present

## 2016-02-26 LAB — POC URINALSYSI DIPSTICK (AUTOMATED)
Bilirubin, UA: NEGATIVE
Ketones, UA: NEGATIVE
NITRITE UA: NEGATIVE
PH UA: 5.5
PROTEIN UA: NEGATIVE
RBC UA: NEGATIVE
UROBILINOGEN UA: 2

## 2016-02-26 LAB — COMPREHENSIVE METABOLIC PANEL
ALBUMIN: 4.1 g/dL (ref 3.5–5.2)
ALT: 12 U/L (ref 0–35)
AST: 12 U/L (ref 0–37)
Alkaline Phosphatase: 70 U/L (ref 39–117)
BUN: 26 mg/dL — AB (ref 6–23)
CHLORIDE: 97 meq/L (ref 96–112)
CO2: 31 meq/L (ref 19–32)
CREATININE: 1 mg/dL (ref 0.40–1.20)
Calcium: 10 mg/dL (ref 8.4–10.5)
GFR: 59.8 mL/min — ABNORMAL LOW (ref >60.00)
Glucose, Bld: 105 mg/dL — ABNORMAL HIGH (ref 70–99)
POTASSIUM: 3.9 meq/L (ref 3.5–5.1)
SODIUM: 138 meq/L (ref 135–145)
Total Bilirubin: 0.4 mg/dL (ref 0.2–1.2)
Total Protein: 7.7 g/dL (ref 6.0–8.3)

## 2016-02-26 MED ORDER — VALSARTAN 80 MG PO TABS
80.0000 mg | ORAL_TABLET | Freq: Every day | ORAL | Status: DC
Start: 2016-02-26 — End: 2017-03-28

## 2016-02-26 NOTE — Patient Instructions (Signed)
3 way arm raise to about 45 degree angle .  Start with 2 # or 3# .[name] Kitchenkeep shoulders down  Keep abdominals engaged   Step ups.  , step up knee up,  Step up, arms up   Biceps and triceps with band.

## 2016-02-26 NOTE — Assessment & Plan Note (Signed)
Per endo °

## 2016-02-26 NOTE — Therapy (Addendum)
[place], [place], [address] Phone: [phone]   Fax:  [phone]  Physical Therapy Treatment  Patient Details  Name: [name] MRN: [id number] Date of Birth: [birth year] Referring Provider: Barry Dienes  Encounter Date: 02/26/2016      PT End of Session - 02/26/16 1755    Visit Number 12   Number of Visits 17   Date for PT Re-Evaluation 03/15/16   PT Start Time 1430   PT Stop Time 1515   PT Time Calculation (min) 45 min   Activity Tolerance Patient tolerated treatment well      Past Medical History:  Diagnosis Date  . Anemia in neoplastic disease   . Benign essential HTN 12/14/2011  . Breast cancer, left ([place]) 2017  . Cancer of corpus uteri, except isthmus [facility]) oncologist-- dr Denman [name] and dr Alvy Bimler    Mar 2006 dx endometrial and ovarian cancer s/p  chemotheapy and surgery  . Chronic idiopathic neutropenia (HCC)    presumed related to chemotherapy March 2006--- followed by dr Alvy Bimler   . Complication of anesthesia    PONV  . DM type 2 (diabetes mellitus, type 2) ([place])   . Dysuria   . GERD (gastroesophageal reflux disease)    takes omeprazole  . Hiatal hernia   . History of bronchitis   . History of gastric polyp    2014  duodenum  . History of radiation therapy    01-29-2015 to 03-10-2015  pelvis 50.4Gy  . History of renal stent   . Hydronephrosis   . Hypothyroidism   . Multiple thyroid nodules    Managed by Dr. Harlow Asa  . Neutropenia ([place])   . PONV (postoperative nausea and vomiting)    "scopolamine patch works for me"  . Radiation-induced dermatitis    contact dermatitis , radiation completed, rash only on ankles now.  . S/P colostomy ([place])   . Seasonal allergies   . Ureteral stricture, right   . Wears glasses     Past Surgical History:  Procedure Laterality Date  . ABDOMINAL HYSTERECTOMY  2006   staging for Ovarian cancer  . APPENDECTOMY    . BREAST RECONSTRUCTION WITH  PLACEMENT OF TISSUE EXPANDER AND FLEX HD (ACELLULAR HYDRATED DERMIS) Bilateral 12/11/2015   Procedure: BILATERAL BREAST RECONSTRUCTION WITH PLACEMENT OF TISSUE EXPANDERS;  Surgeon: Irene Limbo, MD;  Location: [facility];  Service: Plastics;  Laterality: Bilateral;  . COLONOSCOPY WITH PROPOFOL N/A 08/21/2013   Procedure: COLONOSCOPY WITH PROPOFOL;  Surgeon: Cleotis Nipper, MD;  Location: [facility];  Service: Endoscopy;  Laterality: N/A;  . COLOSTOMY TAKEDOWN N/A 12/04/2014   Procedure: LAPROSCOPIC LYSIS OF ADHESIONS, SPLENIC MOBILIZATION, RELOCATION OF COLOSTOMY, DEBRIDEMENT INITIAL COLOSTOMY SITE;  Surgeon: Michael [name], MD;  Location: [facility];  Service: General;  Laterality: N/A;  . CYSTOSCOPY W/ RETROGRADES Right 11/21/2015   Procedure: CYSTOSCOPY WITH RETROGRADE PYELOGRAM;  Surgeon: Alexis Frock, MD;  [facility];  Service: Urology;  Laterality: Right;  . CYSTOSCOPY W/ URETERAL STENT PLACEMENT Right 11/21/2015   Procedure: CYSTOSCOPY WITH STENT REPLACEMENT;  Surgeon: Alexis Frock, MD;  [facility];  Service: Urology;  Laterality: Right;  . CYSTOSCOPY WITH RETROGRADE PYELOGRAM, URETEROSCOPY AND STENT PLACEMENT Right 03/20/2015   Procedure: CYSTOSCOPY WITH RETROGRADE PYELOGRAM, URETEROSCOPY WITH BALLOON DILATION AND STENT PLACEMENT ON RIGHT;  Surgeon: Alexis Frock, MD;  Location[facility];  Service: Urology;  Laterality: Right;  . CYSTOSCOPY WITH RETROGRADE PYELOGRAM, URETEROSCOPY AND STENT PLACEMENT Right 05/02/2015   Procedure:  CYSTOSCOPY WITH RIGHT RETROGRADE PYELOGRAM,  DIAGNOSTIC URETEROSCOPY AND STENT PULL ;  Surgeon: Alexis Frock, MD;  Location: [facility];  Service: Urology;  Laterality: Right;  . CYSTOSCOPY WITH RETROGRADE PYELOGRAM, URETEROSCOPY AND STENT PLACEMENT Right 09/05/2015   Procedure: CYSTOSCOPY WITH RETROGRADE PYELOGRAM,  AND STENT PLACEMENT;  Surgeon: Alexis Frock, MD;  [facility];  Service: Urology;  Laterality: Right;  .  EUS N/A 10/02/2014   Procedure: LOWER ENDOSCOPIC ULTRASOUND (EUS);  Surgeon: Arta Silence, MD;  Location: [facility];  Service: Endoscopy;  Laterality: N/A;  . EXCISION SOFT TISSUE MASS RIGHT FOREMAN  12-08-2006  . EYE SURGERY     pytosis of eyelids-child  . INCISION AND DRAINAGE OF WOUND Bilateral 12/26/2015   Procedure: DEBRIDEMENT OF BILATERAL MASTECTOMY FLAPS;  Surgeon: Irene Limbo, MD;  Location: [place];  Service: Plastics;  Laterality: Bilateral;  . LAPAROSCOPIC CHOLECYSTECTOMY    . MASTECTOMY Right 12/11/2015   PROPHYLACTIC   . MASTECTOMY COMPLETE / SIMPLE W/ SENTINEL NODE BIOPSY Left 12/11/2015   AXILLARY SENTINEL LYMPH NODE BIOPSY   . MASTECTOMY W/ SENTINEL NODE BIOPSY Bilateral 12/11/2015   Procedure: RIGHT PROPHYLACTIC MASTECTOMY, LEFT TOTAL MASTECTOMY WITH LEFT AXILLARY SENTINEL LYMPH NODE BIOPSY;  Surgeon: Stark Klein, MD;  Location: [place];  Service: General;  Laterality: Bilateral;  . OSTOMY N/A 11/19/2014   Procedure: OSTOMY;  Surgeon: Michael [name], MD;  [facility];  Service: General;  Laterality: N/A;  . ROBOTIC ASSISTED LAP VAGINAL HYSTERECTOMY N/A 11/19/2014   Procedure: ROBOTIC LYSIS OF ADHESIONS, CONVERTED TO LAPAROTOMY RADICAL UPPER VAGINECTOMY,LOW ANTERIOR BOWEL RESECTION, COLOSTOMY, BILATERAL URETERAL STENT PLACEMENT AND CYSTONOMY CLOSURE;  Surgeon: Everitt Amber, MD;  [facility];  Service: Gynecology;  Laterality: N/A;  . TISSUE EXPANDER FILLING Bilateral 12/26/2015   Procedure: EXPANSION OF BILATERAL CHEST TISSUE EXPANDERS (60 mL- Right; 75 mL- Left);  Surgeon: Irene Limbo, MD;  Location: [place];  Service: Plastics;  Laterality: Bilateral;  . TONSILLECTOMY      There were no vitals filed for this visit.      Subjective Assessment - 02/26/16 1442    Subjective pt has been to the doctor this morning and to check her urinary stent as she has been having some sharp pain in her back that usually happens when she  needs to have it changed  She feels that she knows what to do for her home exercise and that this could be her last visit   Pertinent History Breast cancer of upper-inner quadrant of left female breast ([place]) Stage IIA (T2, N0, M0) ER,PR positive Her2 neg, s/p bilateral mastectomy, pt has hx of cervical cancer with recurrence and was treated with chemo and radiation, she is diabetic  Plans to have implants in Sept   Patient Stated Goals get full range of motion back, I have to ask my husband to help me open jars   Currently in Pain? Yes   Pain Score --  did not rate    Pain Location Back  she feels this is from her urinary stent    Pain Orientation Right   Pain Descriptors / Indicators Sharp   Pain Type Acute pain                         [facility] Adult PT Treatment/Exercise - 02/26/16 0001      Self-Care   Self-Care Other Self-Care Comments   Other Self-Care Comments  reviewed lymphedema risk reduction practices  Elbow Exercises   Elbow Flexion Strengthening   Theraband Level (Elbow Flexion) Level 2 (Red)   Bar Weights/Barbell (Elbow Flexion) 3 lbs   Elbow Extension Strengthening;10 reps   Theraband Level (Elbow Extension) Level 2 (Red)     Knee/Hip Exercises: Standing   Forward Step Up Both;10 reps;Step Height: 6"   Forward Step Up Limitations showed pt step bench on [email or web address] that she could consider     Shoulder Exercises: Standing   Flexion Strengthening;Both;10 reps;Weights   Shoulder Flexion Weight (lbs) 2   ABduction Strengthening;Both;10 reps;Weights   Shoulder ABduction Weight (lbs) 2   Extension Strengthening;Both;10 reps;Weights   Extension Weight (lbs) 2     Shoulder Exercises: Pulleys   Flexion 2 minutes   ABduction 2 minutes                PT Education - 02/26/16 1755    Education provided Yes   Education Details home exercise    Person(s) Educated Patient   Methods Explanation;Demonstration;Handout   Comprehension Verbalized  understanding;Returned demonstration           Short Term Clinic Goals - 02/26/16 1507      CC Short Term Goal  #1   Title Pt will be independent in verbalizing lymphedema risk reduction practices.    Baseline 02/12/16- reviewed pink sheet with pt   Status Achieved     CC Short Term Goal  #2   Title Pt will demonstrate 40 degrees of L shoulder external rotation to allow pt to wash her hair.   Baseline unable to assess due to pain, 02/12/16- 85 degrees   Status Achieved     CC Short Term Goal  #3   Title Pt to demonstrate 150 degrees of left shoulder flexion to allow her to reach items on shelves   Baseline 112, 02/12/16 - 145 degrees   Status Partially Met     CC Short Term Goal  #4   Title Pt to demonstrate 120 degrees of left shoulder abduction to allow pt to reach items out to sides   Baseline 85 at eval, 143 on 02/05/2016   Status Achieved             Long Term Clinic Goals - 02/26/16 1505      CC Long Term Goal  #1   Title Pt to be independent with a home exercise program for strengthening and stretching.    Status Achieved     CC Long Term Goal  #2   Title Pt to demonstrate 70 degrees of L shoulder IR and ER rotation to allow her to complete ADLs without assist and to wash her hair   Baseline 02/12/16- pt demonstrates 75 degrees of IR and 85 degrees of Er   Status Achieved     CC Long Term Goal  #3   Title Pt to demonstrate 160 degrees of left and right shoulder flexion to allow her to reach items on shelves   Baseline R 136 deg, L 112 deg, 02/12/16  L 145 degrees, R 130 degrees   Status Partially Met     CC Long Term Goal  #4   Title Pt to demonstrate 145 degrees of bilateral shoulder abduction to allow pt to reach items out to sides   Status Partially Met            Plan - 02/26/16 1756    Clinical Impression Statement Pt is doing well with shoulder ROM and strength and is doing home exercise.  She currently is having other medical issues and plans to  have a vacation at end of August and surgery in september and so feels that she is ready to discharge from this episode of PT  Most goals have been met and time was spent today on education of exercise progression to general stregth and fitness in addition to specific shoulder exercise for range of motion and strength that she has been and will continue to do.  Will call [facility] to see when they will be able to [facility] pt for her prophylactic compression garment,    Clinical Impairments Affecting Rehab Potential hx of radiation for cervical cancer   PT Next Visit Plan dishcharge this visit.       Patient will benefit from skilled therapeutic intervention in order to improve the following deficits and impairments:  Postural dysfunction, Decreased range of motion, Increased muscle spasms, Pain, Impaired UE functional use, Increased fascial restricitons, Decreased strength, Decreased knowledge of use of DME, Decreased knowledge of precautions, Decreased scar mobility  Visit Diagnosis: Stiffness of left shoulder, not elsewhere classified  Stiffness of right shoulder, not elsewhere classified  Pain in left shoulder  Pain in right shoulder     Problem List Patient Active Problem List   Diagnosis Date Noted  . Breast cancer, left ([place]) 12/11/2015  . Genetic testing 11/27/2015  . Cancer of corpus uteri, except isthmus ([place]) 11/13/2015  . Breast cancer of upper-inner quadrant of left female breast ([place])   . Dehydration 10/09/2015  . Acute bacterial bronchitis 06/04/2015  . Hydronephrosis, right 03/19/2015  . Pelvic pain in female 03/19/2015  . Rash, skin 03/06/2015  . Diarrhea 02/20/2015  . Fever 12/30/2014  . Sepsis ([place]) 12/30/2014  . UTI (lower urinary tract infection) 12/30/2014  . Diabetes mellitus type 2, controlled ([place]) 12/30/2014  . Chronic anemia 12/30/2014  . Urinary tract infectious disease   . Hypomagnesemia 12/11/2014  . Hypokalemia 12/11/2014  . Cellulitis of left  abdominal wall near colostomy 12/03/2014  . Reactive thrombocytosis 11/29/2014  . Pancytopenia, acquired ([place]) 11/26/2014  . Acute respiratory failure with hypoxia ([place]) 11/20/2014  . Morbid obesity ([place]) 11/20/2014  . Endotracheally intubated   . 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 Abdominal pain 09/09/2014  . Postmenopausal bleeding 09/05/2014  . History of ovarian & endometrial cancer 07/17/2012  . History of endometrial cancer 05/10/2012  . Malignant neoplasm of corpus uteri, except isthmus ([place]) 05/10/2012  . Malignant neoplasm of ovary ([place]) 05/10/2012  . Chronic neutropenia ([place]) 12/14/2011  . Hypothyroidism 12/14/2011  . DM type 2 (diabetes mellitus, type 2) ([place]) 12/14/2011  . Benign essential HTN 12/14/2011     PHYSICAL THERAPY DISCHARGE SUMMARY  Visits from Start of Care: 12  Current functional level related to goals / functional outcomes: indpendent in home program   Remaining deficits: Decreased shoulder range of motion    Education / Equipment: Home exercise,lymphedema risk reduction  Plan: Patient agrees to discharge.  Patient goals were partially met. Patient is being discharged due to a change in medical status.  ?????          Donato Heinz. Owens Shark PT  Norwood Levo 02/26/2016, 6:02 PM  [place], [place], [address] Phone: [phone]   Fax:  [phone]  Name: [name] MRN: [id number] Date of Birth: 1956-04-09

## 2016-02-26 NOTE — Progress Notes (Signed)
Pre visit review using our clinic review tool, if applicable. No additional management support is needed unless otherwise documented below in the visit note. 

## 2016-02-26 NOTE — Assessment & Plan Note (Signed)
Stable-- per endo

## 2016-02-26 NOTE — Progress Notes (Signed)
Patient ID: [name], female    DOB: May 02, 1956  Age: 60 y.o. MRN: [id number]    Subjective:  Subjective  HPI [name] presents for f/u cholesterol and bp.   Pt back is hurting but she feels she is due for stent change with urology.  No other complaints  Review of Systems  Constitutional: Positive for fatigue. Negative for activity change, appetite change and unexpected weight change.  Respiratory: Negative for cough and shortness of breath.   Cardiovascular: Negative for chest pain and palpitations.  Psychiatric/Behavioral: Negative for behavioral problems and dysphoric mood. The patient is not nervous/anxious.     History Past Medical History:  Diagnosis Date  . Anemia in neoplastic disease   . Benign essential HTN 12/14/2011  . Breast cancer, left ([place]) 2017  . Cancer of corpus uteri, except isthmus [facility]) oncologist-- dr Denman [name] and dr Alvy Bimler    Mar 2006 dx endometrial and ovarian cancer s/p  chemotheapy and surgery  . Chronic idiopathic neutropenia (HCC)    presumed related to chemotherapy March 2006--- followed by dr Alvy Bimler   . Complication of anesthesia    PONV  . DM type 2 (diabetes mellitus, type 2) ([place])   . Dysuria   . GERD (gastroesophageal reflux disease)    takes omeprazole  . Hiatal hernia   . History of bronchitis   . History of gastric polyp    2014  duodenum  . History of radiation therapy    01-29-2015 to 03-10-2015  pelvis 50.4Gy  . History of renal stent   . Hydronephrosis   . Hypothyroidism   . Multiple thyroid nodules    Managed by Dr. Harlow Asa  . Neutropenia ([place])   . PONV (postoperative nausea and vomiting)    "scopolamine patch works for me"  . Radiation-induced dermatitis    contact dermatitis , radiation completed, rash only on ankles now.  . S/P colostomy ([place])   . Seasonal allergies   . Ureteral stricture, right   . Wears glasses     She has a past surgical history that includes Appendectomy; Tonsillectomy; Eye surgery; Colonoscopy  with propofol (N/A, 08/21/2013); EUS (N/A, 10/02/2014); Robotic assisted lap vaginal hysterectomy (N/A, 11/19/2014); Ostomy (N/A, 11/19/2014); Colostomy takedown (N/A, 12/04/2014); Cystoscopy with retrograde pyelogram, ureteroscopy and stent placement (Right, 03/20/2015); EXCISION SOFT TISSUE MASS RIGHT FOREMAN (12-08-2006); Abdominal hysterectomy (2006); Laparoscopic cholecystectomy; Cystoscopy with retrograde pyelogram, ureteroscopy and stent placement (Right, 05/02/2015); Cystoscopy with retrograde pyelogram, ureteroscopy and stent placement (Right, 09/05/2015); Cystoscopy w/ retrogrades (Right, 11/21/2015); Cystoscopy w/ ureteral stent placement (Right, 11/21/2015); Mastectomy (Right, 12/11/2015); Mastectomy complete / simple w/ sentinel node biopsy (Left, 12/11/2015); Mastectomy w/ sentinel node biopsy (Bilateral, 12/11/2015); Breast reconstruction with placement of tissue expander and flex hd (acellular hydrated dermis) (Bilateral, 12/11/2015); Incision and drainage of wound (Bilateral, 12/26/2015); and Tissue expander filling (Bilateral, 12/26/2015).   Her family history includes Brain cancer in her paternal grandfather; Breast cancer in her maternal aunt; Cancer (age of onset: 10) in her mother; Cancer (age of onset: 34) in her father; Diabetes in her brother, father, and sister; Heart disease in her brother and father; Hypertension in her mother; Hypertension (age of onset: y-10) in her brother; Lymphoma in her paternal aunt; Ovarian cancer in her other.She reports that she has never smoked. She has never used smokeless tobacco. She reports that she drinks alcohol. She reports that she does not use drugs.  Current Outpatient Prescriptions on File Prior to Visit  Medication Sig Dispense Refill  . anastrozole (ARIMIDEX) 1 MG  tablet Take 1 tablet (1 mg total) by mouth daily. 90 tablet 3  . Biotin 5 MG TABS Take 5 mg by mouth every morning.     . Canagliflozin (INVOKANA) 300 MG TABS Take 300 mg by mouth every morning.       . Cholecalciferol (VITAMIN D3) 10000 UNITS capsule Take 10,000 Units by mouth once a week. Sundays    . diphenhydrAMINE (BENADRYL) 25 MG tablet Take 25 mg by mouth every 4 (four) hours as needed for itching, allergies or sleep. Reported on 09/15/2015    . filgrastim (NEUPOGEN) 480 MCG/1.6ML injection Inject 1.6 ml under the skin every 3 days for life 16 mL 11  . hyaluronate sodium (RADIAPLEXRX) GEL Apply 1 application topically 2 (two) times daily as needed (scars). Reported on 09/15/2015    . levofloxacin (LEVAQUIN) 750 MG tablet Reported on 01/19/2016    . levothyroxine (SYNTHROID) 175 MCG tablet Take 87.5-175 mcg by mouth daily before breakfast. 129mcg daily except 87.56mcg on Sundays.    [name] Kitchen loratadine (CLARITIN) 10 MG tablet Take 10 mg by mouth daily.    . metFORMIN (GLUCOPHAGE) 1000 MG tablet Take 1,000 mg by mouth 2 (two) times daily with a meal.     . Multiple Vitamin (MULTIVITAMIN WITH MINERALS) TABS tablet Take 1 tablet by mouth daily.    [name] Kitchen omega-3 acid ethyl esters (LOVAZA) 1 G capsule Take 1 g by mouth 2 (two) times daily.    [name] Kitchen omeprazole (PRILOSEC) 20 MG capsule Take 20 mg by mouth at bedtime.     Vladimir Faster Glycol-Propyl Glycol (SYSTANE OP) Place 1 drop into both eyes at bedtime.     . polyethylene glycol (MIRALAX / GLYCOLAX) packet Take 17 g by mouth. Reported on 01/19/2016    . rosuvastatin (CRESTOR) 10 MG tablet Take 10 mg by mouth every evening.     . sitaGLIPtin (JANUVIA) 100 MG tablet Take 100 mg by mouth daily.    . methocarbamol (ROBAXIN) 500 MG tablet Take 1 tablet (500 mg total) by mouth every 8 (eight) hours as needed for muscle spasms. (Patient not taking: Reported on 02/26/2016) 30 tablet 0  . oxyCODONE (OXY IR/ROXICODONE) 5 MG immediate release tablet Take 1-2 tablets (5-10 mg total) by mouth every 4 (four) hours as needed for moderate pain. (Patient not taking: Reported on 02/26/2016) 50 tablet 0   No current facility-administered medications on file prior to visit.       Objective:  Objective  Physical Exam  Constitutional: She is oriented to person, place, and time. She appears well-developed and well-nourished.  HENT:  Head: Normocephalic and atraumatic.  Eyes: Conjunctivae and EOM are normal.  Neck: Normal range of motion. Neck supple. No JVD present. Carotid bruit is not present. No thyromegaly present.  Cardiovascular: Normal rate, regular rhythm and normal heart sounds.   No murmur heard. Pulmonary/Chest: Effort normal and breath sounds normal. No respiratory distress. She has no wheezes. She has no rales. She exhibits no tenderness.  Musculoskeletal: She exhibits no edema.  Neurological: She is alert and oriented to person, place, and time.  Psychiatric: She has a normal mood and affect. Her behavior is normal. Judgment and thought content normal.  Nursing note and vitals reviewed.  BP 105/73 (BP Location: Right Arm, Patient Position: Sitting, Cuff Size: Normal)   Pulse 89   Temp 98.4 F (36.9 C) (Oral)   Resp 16   Ht 5\' 3"  (1.6 m)   Wt 198 lb 9.6 oz (90.1 kg)   SpO2  100%   BMI 35.18 kg/m  Wt Readings from Last 3 Encounters:  02/26/16 198 lb 9.6 oz (90.1 kg)  01/01/16 210 lb 3.2 oz (95.3 kg)  12/23/15 210 lb (95.3 kg)     Lab Results  Component Value Date   WBC 4.7 01/01/2016   HGB 10.1 (L) 01/01/2016   HCT 33.1 (L) 01/01/2016   PLT 325 01/01/2016   GLUCOSE 126 (H) 12/12/2015   CHOL 109 08/28/2015   TRIG 99.0 08/28/2015   HDL 43.80 08/28/2015   LDLCALC 45 08/28/2015   ALT 18 11/26/2015   AST 13 11/26/2015   NA 141 12/12/2015   K 3.4 (L) 12/12/2015   CL 102 12/12/2015   CREATININE 1.00 02/09/2016   BUN 11 12/12/2015   CO2 28 12/12/2015   TSH 0.63 08/28/2015   INR 1.18 12/31/2014   HGBA1C 5.8 (H) 09/05/2015   MICROALBUR <0.7 08/28/2015    Ct Abdomen Pelvis W Contrast  Result Date: 02/09/2016 CLINICAL DATA:  Followup recurrent endometrial carcinoma. Right lower quadrant pain for 1 year. Previous surgery, chemotherapy,  and radiation therapy. EXAM: CT ABDOMEN AND PELVIS WITH CONTRAST TECHNIQUE: Multidetector CT imaging of the abdomen and pelvis was performed using the standard protocol following bolus administration of intravenous contrast. CONTRAST:  125mL ISOVUE-300 IOPAMIDOL (ISOVUE-300) INJECTION 61% COMPARISON:  08/18/2015 FINDINGS: Lower Chest: No acute findings. Hepatobiliary: No mass identified. Mild hepatic steatosis again noted. Prior cholecystectomy noted. No evidence of biliary dilatation. Pancreas:  No mass or inflammatory changes. Spleen:  Unremarkable. Adrenals/Urinary Tract: Stable small benign left adrenal adenoma. No renal masses identified. Moderate right hydronephrosis and ureterectasis shows no significant change, however a right ureteral stent is now seen in appropriate position. Unopacified urinary bladder is unremarkable in appearance. Stomach/Bowel: Prior left proctocolectomy again seen with transverse colostomy in the left abdomen. No evidence of bowel obstruction wall thickening, inflammatory changes, or abnormal fluid collections. Vascular/Lymphatic: No pathologically enlarged lymph nodes. No other abnormality identified. Reproductive: Previous hysterectomy noted. The pre sacral soft tissue density remains stable, and most consistent with post treatment changes. No evidence recurrent mass. Other:  None. Musculoskeletal:  No suspicious bone lesions identified. IMPRESSION: Stable post treatment changes in presacral region. No evidence of recurrent or metastatic carcinoma within the abdomen or pelvis. Interval placement of right ureteral stent in appropriate position. Moderate right hydroureteronephrosis remains stable. Stable benign left adrenal adenoma and hepatic steatosis. Electronically Signed   By: Earle Gell M.D.   On: 02/09/2016 12:58     Assessment & Plan:  Plan  I have changed Ms. Gow's valsartan. I am also having her maintain her metFORMIN, rosuvastatin, omega-3 acid ethyl esters,  sitaGLIPtin, levothyroxine, canagliflozin, omeprazole, Vitamin D3, multivitamin with minerals, Polyethyl Glycol-Propyl Glycol (SYSTANE OP), Biotin, hyaluronate sodium, diphenhydrAMINE, loratadine, filgrastim, methocarbamol, oxyCODONE, levofloxacin, polyethylene glycol, and anastrozole.  Meds ordered this encounter  Medications  . valsartan (DIOVAN) 80 MG tablet    Sig: Take 1 tablet (80 mg total) by mouth daily.    Problem List Items Addressed This Visit      Unprioritized   Benign essential HTN    Stable, con't meds      Relevant Medications   valsartan (DIOVAN) 80 MG tablet   Hypothyroidism    Stable-- per endo       Other Visit Diagnoses    Kidney pain    -  Primary   Relevant Orders   POCT Urinalysis Dipstick (Automated) (Completed)   Urine Culture   Leukocytes in urine  Essential hypertension       Relevant Medications   valsartan (DIOVAN) 80 MG tablet   Other Relevant Orders   Comprehensive metabolic panel   Hyperlipidemia       Relevant Medications   valsartan (DIOVAN) 80 MG tablet   Other Relevant Orders   Comprehensive metabolic panel      Follow-up: Return in about 6 months (around 08/28/2016) for fasting, annual exam.  Ann Held, DO

## 2016-02-26 NOTE — Patient Instructions (Signed)
Cholesterol Cholesterol is a white, waxy, fat-like substance needed by your body in small amounts. The liver makes all the cholesterol you need. Cholesterol is carried from the liver by the blood through the blood vessels. Deposits of cholesterol (plaque) may build up on blood vessel walls. These make the arteries narrower and stiffer. Cholesterol plaques increase the risk for heart attack and stroke.  You cannot feel your cholesterol level even if it is very high. The only way to know it is high is with a blood test. Once you know your cholesterol levels, you should keep a record of the test results. Work with your health care provider to keep your levels in the desired range.  WHAT DO THE RESULTS MEAN?  Total cholesterol is a rough measure of all the cholesterol in your blood.   LDL is the so-called bad cholesterol. This is the type that deposits cholesterol in the walls of the arteries. You want this level to be low.   HDL is the good cholesterol because it cleans the arteries and carries the LDL away. You want this level to be high.  Triglycerides are fat that the body can either burn for energy or store. High levels are closely linked to heart disease.  WHAT ARE THE DESIRED LEVELS OF CHOLESTEROL?  Total cholesterol below 200.   LDL below 100 for people at risk, below 70 for those at very high risk.   HDL above 50 is good, above 60 is best.   Triglycerides below 150.  HOW CAN I LOWER MY CHOLESTEROL?  Diet. Follow your diet programs as directed by your health care provider.   Choose fish or white meat chicken and turkey, roasted or baked. Limit fatty cuts of red meat, fried foods, and processed meats, such as sausage and lunch meats.   Eat lots of fresh fruits and vegetables.  Choose whole grains, beans, pasta, potatoes, and cereals.   Use only small amounts of olive, corn, or canola oils.   Avoid butter, mayonnaise, shortening, or palm kernel oils.  Avoid foods with  trans fats.   Drink skim or nonfat milk and eat low-fat or nonfat yogurt and cheeses. Avoid whole milk, cream, ice cream, egg yolks, and full-fat cheeses.   Healthy desserts include angel food cake, ginger snaps, animal crackers, hard candy, popsicles, and low-fat or nonfat frozen yogurt. Avoid pastries, cakes, pies, and cookies.   Exercise. Follow your exercise programs as directed by your health care provider.   A regular program helps decrease LDL and raise HDL.   A regular program helps with weight control.   Do things that increase your activity level like gardening, walking, or taking the stairs. Ask your health care provider about how you can be more active in your daily life.   Medicine. Take medicine only as directed by your health care provider.   Medicine may be prescribed by your health care provider to help lower cholesterol and decrease the risk for heart disease.   If you have several risk factors, you may need medicine even if your levels are normal.   This information is not intended to replace advice given to you by your health care provider. Make sure you discuss any questions you have with your health care provider.   Document Released: 04/13/2001 Document Revised: 08/09/2014 Document Reviewed: 05/02/2013 Elsevier Interactive Patient Education 2016 [employer] Inc[employer]  

## 2016-02-26 NOTE — Assessment & Plan Note (Signed)
Stable , con't meds  

## 2016-02-27 LAB — URINE CULTURE: Organism ID, Bacteria: 10000

## 2016-03-02 ENCOUNTER — Encounter: Payer: BLUE CROSS/BLUE SHIELD | Admitting: Physical Therapy

## 2016-03-04 ENCOUNTER — Encounter: Payer: BLUE CROSS/BLUE SHIELD | Admitting: Physical Therapy

## 2016-03-04 ENCOUNTER — Encounter (HOSPITAL_BASED_OUTPATIENT_CLINIC_OR_DEPARTMENT_OTHER): Payer: Self-pay | Admitting: *Deleted

## 2016-03-04 ENCOUNTER — Other Ambulatory Visit: Payer: Self-pay | Admitting: Urology

## 2016-03-05 ENCOUNTER — Encounter (HOSPITAL_BASED_OUTPATIENT_CLINIC_OR_DEPARTMENT_OTHER): Payer: Self-pay | Admitting: *Deleted

## 2016-03-05 DIAGNOSIS — Z933 Colostomy status: Secondary | ICD-10-CM | POA: Diagnosis not present

## 2016-03-05 NOTE — Progress Notes (Addendum)
NPO AFTER MN.  ARRIVE AT 0700.  NEEDS ISTAT 8.  CURRENT EKG IN CHART AND EPIC.  WILL TAKE AM MEDS W/ SIPS OF WATER DOS WITH EXCEPTION NO DIABETIC MEDS.  PT STATES REMEMBER SHE IS DIFFICULT IV STICK, VEINS ARE SMALL AND USING SMALLER CATH HELPS.  HER LAST VISIT W/ [place] SUCCESS FIRST ATTEMPT, USED LEFT WRIST WITH 22G.

## 2016-03-09 ENCOUNTER — Encounter: Payer: BLUE CROSS/BLUE SHIELD | Admitting: Physical Therapy

## 2016-03-09 NOTE — Anesthesia Preprocedure Evaluation (Addendum)
Anesthesia Evaluation  Patient identified by MRN, date of birth, ID band Patient awake    Reviewed: Allergy & Precautions, NPO status , Patient's Chart, lab work & pertinent test results  History of Anesthesia Complications (+) PONV and history of anesthetic complications  Airway Mallampati: I  TM Distance: >3 FB Neck ROM: Full    Dental  (+) Teeth Intact, Dental Advisory Given   Pulmonary neg pulmonary ROS, neg shortness of breath, neg sleep apnea, neg COPD, neg recent URI,    Pulmonary exam normal breath sounds clear to auscultation       Cardiovascular hypertension, Pt. on medications (-) angina(-) Past MI and (-) Cardiac Stents  Rhythm:Regular Rate:Normal     Neuro/Psych neg Seizures    GI/Hepatic hiatal hernia, GERD  Controlled,  Endo/Other  diabetes, Type 2Hypothyroidism   Renal/GU Renal diseaseHydronephrosis of right kidney     Musculoskeletal   Abdominal (+) + obese,   Peds  Hematology  (+) Blood dyscrasia (chronic idiopathic neutropenia), anemia ,   Anesthesia Other Findings H/o endometrial and ovarian cancer s/p chemo/radiation and surgery, h/o left breast cancer s/p bilateral mastectomy, radiation-induced dermatitis, HLD, diabetic retinopathy  Reproductive/Obstetrics                            Anesthesia Physical Anesthesia Plan  ASA: III  Anesthesia Plan: General   Post-op Pain Management:    Induction: Intravenous  Airway Management Planned: LMA  Additional Equipment:   Intra-op Plan:   Post-operative Plan: Extubation in OR  Informed Consent: I have reviewed the patients History and Physical, chart, labs and discussed the procedure including the risks, benefits and alternatives for the proposed anesthesia with the patient or authorized representative who has indicated his/her understanding and acceptance.   Dental advisory given  Plan Discussed with:    Anesthesia Plan Comments:        Anesthesia Quick Evaluation

## 2016-03-10 ENCOUNTER — Ambulatory Visit (HOSPITAL_BASED_OUTPATIENT_CLINIC_OR_DEPARTMENT_OTHER): Payer: BLUE CROSS/BLUE SHIELD | Admitting: Anesthesiology

## 2016-03-10 ENCOUNTER — Other Ambulatory Visit: Payer: Self-pay | Admitting: Hematology and Oncology

## 2016-03-10 ENCOUNTER — Encounter (HOSPITAL_BASED_OUTPATIENT_CLINIC_OR_DEPARTMENT_OTHER): Payer: Self-pay | Admitting: *Deleted

## 2016-03-10 ENCOUNTER — Encounter (HOSPITAL_BASED_OUTPATIENT_CLINIC_OR_DEPARTMENT_OTHER)
Admission: RE | Disposition: A | Discharge status: Home / Self Care | Payer: Self-pay | Source: Ambulatory Visit | Attending: Urology

## 2016-03-10 ENCOUNTER — Ambulatory Visit (HOSPITAL_BASED_OUTPATIENT_CLINIC_OR_DEPARTMENT_OTHER)
Admission: RE | Admit: 2016-03-10 | Discharge: 2016-03-10 | Disposition: A | Discharge status: Home / Self Care | Payer: BLUE CROSS/BLUE SHIELD | Source: Ambulatory Visit | Attending: Urology | Admitting: Urology

## 2016-03-10 DIAGNOSIS — E782 Mixed hyperlipidemia: Secondary | ICD-10-CM | POA: Insufficient documentation

## 2016-03-10 DIAGNOSIS — E559 Vitamin D deficiency, unspecified: Secondary | ICD-10-CM | POA: Insufficient documentation

## 2016-03-10 DIAGNOSIS — E11319 Type 2 diabetes mellitus with unspecified diabetic retinopathy without macular edema: Secondary | ICD-10-CM | POA: Diagnosis not present

## 2016-03-10 DIAGNOSIS — Z85038 Personal history of other malignant neoplasm of large intestine: Secondary | ICD-10-CM | POA: Insufficient documentation

## 2016-03-10 DIAGNOSIS — Z853 Personal history of malignant neoplasm of breast: Secondary | ICD-10-CM | POA: Diagnosis not present

## 2016-03-10 DIAGNOSIS — Z7984 Long term (current) use of oral hypoglycemic drugs: Secondary | ICD-10-CM | POA: Diagnosis not present

## 2016-03-10 DIAGNOSIS — C50212 Malignant neoplasm of upper-inner quadrant of left female breast: Secondary | ICD-10-CM

## 2016-03-10 DIAGNOSIS — K219 Gastro-esophageal reflux disease without esophagitis: Secondary | ICD-10-CM | POA: Diagnosis not present

## 2016-03-10 DIAGNOSIS — Z8589 Personal history of malignant neoplasm of other organs and systems: Secondary | ICD-10-CM | POA: Diagnosis not present

## 2016-03-10 DIAGNOSIS — J449 Chronic obstructive pulmonary disease, unspecified: Secondary | ICD-10-CM | POA: Diagnosis not present

## 2016-03-10 DIAGNOSIS — E039 Hypothyroidism, unspecified: Secondary | ICD-10-CM | POA: Diagnosis not present

## 2016-03-10 DIAGNOSIS — Z923 Personal history of irradiation: Secondary | ICD-10-CM | POA: Insufficient documentation

## 2016-03-10 DIAGNOSIS — Z79899 Other long term (current) drug therapy: Secondary | ICD-10-CM | POA: Insufficient documentation

## 2016-03-10 DIAGNOSIS — N131 Hydronephrosis with ureteral stricture, not elsewhere classified: Secondary | ICD-10-CM | POA: Insufficient documentation

## 2016-03-10 DIAGNOSIS — Z9013 Acquired absence of bilateral breasts and nipples: Secondary | ICD-10-CM | POA: Diagnosis not present

## 2016-03-10 DIAGNOSIS — Z6835 Body mass index (BMI) 35.0-35.9, adult: Secondary | ICD-10-CM | POA: Insufficient documentation

## 2016-03-10 DIAGNOSIS — R109 Unspecified abdominal pain: Secondary | ICD-10-CM | POA: Diagnosis not present

## 2016-03-10 DIAGNOSIS — N189 Chronic kidney disease, unspecified: Secondary | ICD-10-CM | POA: Diagnosis not present

## 2016-03-10 DIAGNOSIS — I129 Hypertensive chronic kidney disease with stage 1 through stage 4 chronic kidney disease, or unspecified chronic kidney disease: Secondary | ICD-10-CM | POA: Diagnosis not present

## 2016-03-10 DIAGNOSIS — I10 Essential (primary) hypertension: Secondary | ICD-10-CM | POA: Insufficient documentation

## 2016-03-10 DIAGNOSIS — E669 Obesity, unspecified: Secondary | ICD-10-CM | POA: Diagnosis not present

## 2016-03-10 DIAGNOSIS — N135 Crossing vessel and stricture of ureter without hydronephrosis: Secondary | ICD-10-CM | POA: Diagnosis not present

## 2016-03-10 HISTORY — PX: CYSTOSCOPY W/ URETERAL STENT PLACEMENT: SHX1429

## 2016-03-10 HISTORY — DX: Vitamin D deficiency, unspecified: E55.9

## 2016-03-10 HISTORY — DX: Type 2 diabetes mellitus with mild nonproliferative diabetic retinopathy without macular edema, unspecified eye: E11.3299

## 2016-03-10 HISTORY — DX: Mixed hyperlipidemia: E78.2

## 2016-03-10 LAB — POCT I-STAT, CHEM 8
BUN: 21 mg/dL — ABNORMAL HIGH (ref 6–20)
CALCIUM ION: 1.29 mmol/L — AB (ref 1.12–1.23)
Chloride: 101 mmol/L (ref 101–111)
Creatinine, Ser: 0.8 mg/dL (ref 0.44–1.00)
GLUCOSE: 117 mg/dL — AB (ref 65–99)
HCT: 43 % (ref 36.0–46.0)
Hemoglobin: 14.6 g/dL (ref 12.0–15.0)
Potassium: 3.5 mmol/L (ref 3.5–5.1)
SODIUM: 142 mmol/L (ref 135–145)
TCO2: 25 mmol/L (ref 0–100)

## 2016-03-10 LAB — GLUCOSE, CAPILLARY: Glucose-Capillary: 108 mg/dL — ABNORMAL HIGH (ref 65–99)

## 2016-03-10 SURGERY — CYSTOSCOPY, FLEXIBLE, WITH STENT REPLACEMENT
Anesthesia: General | Site: Ureter | Laterality: Right

## 2016-03-10 MED ORDER — LIDOCAINE HCL (CARDIAC) 20 MG/ML IV SOLN
INTRAVENOUS | Status: AC
  Filled 2016-03-10: qty 5

## 2016-03-10 MED ORDER — PROPOFOL 10 MG/ML IV BOLUS
INTRAVENOUS | Status: DC | PRN
  Administered 2016-03-10: 150 mg via INTRAVENOUS

## 2016-03-10 MED ORDER — PROMETHAZINE HCL 25 MG/ML IJ SOLN
12.5000 mg | Freq: Once | INTRAMUSCULAR | Status: DC | PRN
  Filled 2016-03-10: qty 1

## 2016-03-10 MED ORDER — ONDANSETRON HCL 4 MG/2ML IJ SOLN
INTRAMUSCULAR | Status: DC | PRN
  Administered 2016-03-10: 4 mg via INTRAVENOUS

## 2016-03-10 MED ORDER — MIDAZOLAM HCL 5 MG/5ML IJ SOLN
INTRAMUSCULAR | Status: DC | PRN
  Administered 2016-03-10: 2 mg via INTRAVENOUS

## 2016-03-10 MED ORDER — GENTAMICIN IN SALINE 1.6-0.9 MG/ML-% IV SOLN
80.0000 mg | INTRAVENOUS | Status: DC
  Filled 2016-03-10: qty 50

## 2016-03-10 MED ORDER — MIDAZOLAM HCL 2 MG/2ML IJ SOLN
INTRAMUSCULAR | Status: AC
  Filled 2016-03-10: qty 2

## 2016-03-10 MED ORDER — GENTAMICIN SULFATE 40 MG/ML IJ SOLN
5.0000 mg/kg | INTRAVENOUS | Status: AC
  Administered 2016-03-10: 340 mg via INTRAVENOUS
  Filled 2016-03-10 (×2): qty 8.5

## 2016-03-10 MED ORDER — SODIUM CHLORIDE 0.9 % IR SOLN
Status: DC | PRN
  Administered 2016-03-10: 1000 mL

## 2016-03-10 MED ORDER — LACTATED RINGERS IV SOLN
INTRAVENOUS | Status: DC
  Administered 2016-03-10: 07:00:00 via INTRAVENOUS
  Filled 2016-03-10: qty 1000

## 2016-03-10 MED ORDER — FENTANYL CITRATE (PF) 100 MCG/2ML IJ SOLN
25.0000 ug | INTRAMUSCULAR | Status: DC | PRN
  Filled 2016-03-10: qty 1

## 2016-03-10 MED ORDER — DEXAMETHASONE SODIUM PHOSPHATE 4 MG/ML IJ SOLN
INTRAMUSCULAR | Status: DC | PRN
  Administered 2016-03-10: 10 mg via INTRAVENOUS

## 2016-03-10 MED ORDER — DEXAMETHASONE SODIUM PHOSPHATE 10 MG/ML IJ SOLN
INTRAMUSCULAR | Status: AC
  Filled 2016-03-10: qty 1

## 2016-03-10 MED ORDER — FENTANYL CITRATE (PF) 100 MCG/2ML IJ SOLN
INTRAMUSCULAR | Status: AC
  Filled 2016-03-10: qty 2

## 2016-03-10 MED ORDER — LIDOCAINE HCL (CARDIAC) 20 MG/ML IV SOLN
INTRAVENOUS | Status: DC | PRN
  Administered 2016-03-10: 60 mg via INTRAVENOUS

## 2016-03-10 MED ORDER — PROPOFOL 500 MG/50ML IV EMUL
INTRAVENOUS | Status: AC
  Filled 2016-03-10: qty 50

## 2016-03-10 MED ORDER — SCOPOLAMINE 1 MG/3DAYS TD PT72
MEDICATED_PATCH | TRANSDERMAL | Status: AC
  Filled 2016-03-10: qty 1

## 2016-03-10 MED ORDER — SCOPOLAMINE 1 MG/3DAYS TD PT72
1.0000 | MEDICATED_PATCH | TRANSDERMAL | Status: DC
  Administered 2016-03-10: 1.5 mg via TRANSDERMAL
  Filled 2016-03-10: qty 1

## 2016-03-10 MED ORDER — OXYCODONE HCL 5 MG PO TABS: 5.0000 mg | ORAL_TABLET | ORAL | 0 refills | Status: DC | PRN

## 2016-03-10 MED ORDER — ARTIFICIAL TEARS OP OINT
TOPICAL_OINTMENT | OPHTHALMIC | Status: AC
  Filled 2016-03-10: qty 3.5

## 2016-03-10 MED ORDER — IOHEXOL 300 MG/ML  SOLN
INTRAMUSCULAR | Status: DC | PRN
  Administered 2016-03-10: 9 mL

## 2016-03-10 MED ORDER — FENTANYL CITRATE (PF) 100 MCG/2ML IJ SOLN
INTRAMUSCULAR | Status: DC | PRN
  Administered 2016-03-10: 50 ug via INTRAVENOUS

## 2016-03-10 MED ORDER — ONDANSETRON HCL 4 MG/2ML IJ SOLN
INTRAMUSCULAR | Status: AC
  Filled 2016-03-10: qty 2

## 2016-03-10 SURGICAL SUPPLY — 21 items
BAG DRAIN URO-CYSTO SKYTR STRL (DRAIN) ×2 IMPLANT
CATH INTERMIT  6FR 70CM (CATHETERS) ×2 IMPLANT
CLOTH BEACON ORANGE TIMEOUT ST (SAFETY) ×2 IMPLANT
GLOVE BIO SURGEON STRL SZ7.5 (GLOVE) ×2 IMPLANT
GOWN STRL REUS W/ TWL LRG LVL3 (GOWN DISPOSABLE) ×2 IMPLANT
GOWN STRL REUS W/ TWL XL LVL3 (GOWN DISPOSABLE) ×1 IMPLANT
GOWN STRL REUS W/TWL LRG LVL3 (GOWN DISPOSABLE) ×4 IMPLANT
GOWN STRL REUS W/TWL XL LVL3 (GOWN DISPOSABLE) ×1
GUIDEWIRE ANG ZIPWIRE 038X150 (WIRE) ×2 IMPLANT
GUIDEWIRE STR DUAL SENSOR (WIRE) IMPLANT
IV NS 1000ML (IV SOLUTION) ×1
IV NS 1000ML BAXH (IV SOLUTION) ×1 IMPLANT
IV NS IRRIG 3000ML ARTHROMATIC (IV SOLUTION) IMPLANT
KIT ROOM TURNOVER WOR (KITS) ×2 IMPLANT
MANIFOLD NEPTUNE II (INSTRUMENTS) IMPLANT
PACK CYSTO (CUSTOM PROCEDURE TRAY) ×2 IMPLANT
STENT POLARIS LOOP 6FR X 24 CM (STENTS) ×2 IMPLANT
SYRINGE 10CC LL (SYRINGE) ×2 IMPLANT
TUBE CONNECTING 12X1/4 (SUCTIONS) IMPLANT
TUBE FEEDING 8FR 16IN STR KANG (MISCELLANEOUS) IMPLANT
WATER STERILE IRR 500ML POUR (IV SOLUTION) ×2 IMPLANT

## 2016-03-10 NOTE — Op Note (Addendum)
NAMEAROHI, CARAMANICA NO.:  [id number]  MEDICAL RECORD NO.:  [id number]  LOCATION:                               FACILITY:  [facility]  PHYSICIAN:  Alexis Frock, MD     DATE OF BIRTH:  Nov 22, 1955  DATE OF PROCEDURE:  03/10/2016                              OPERATIVE REPORT   PREOP DIAGNOSIS:  Chronic right ureteral stricture.  PROCEDURES: 1. Cystoscopy with right retrograde pyelogram and interpretation. 2. Right ureteral stent exchange, 6 x 24 Polaris, no tether.  ESTIMATED BLOOD LOSS:  Nil.  NOTIFICATION:  None.  SPECIMEN:  None.  FINDINGS: 1. Stable mild-to-moderate hydroureteronephrosis. 2. Distal stricture likely at the level of iliac crossings and below. 3. Successful replacement of right ureteral stent, proximal in upper     pole, distal in urinary bladder.  INDICATION:  Ms. Khang is a pleasant but quite unfortunate 60 year old lady with history of advanced gynecologic pelvic cancer, status post chemoradiation and surgical extirpation and subsequent chronic right ureteral stricture for which she has been stent dependent with changes approximately every 3-6 months.  She unfortunately now has primary breast cancer, status post bilateral mastectomy and receiving adjuvant therapy for this as well.  Given her continued oncologic burden, it has been felt that management of her stricture with stenting is most prudent means to proceed and she is due for stent change.  Informed consent was obtained and placed in the medical record.  PROCEDURE IN DETAIL:  The patient being [facility], was verified. Procedure being right ureteral stent exchange was confirmed.  Procedure was carried out.  Time-out was performed.  Intravenous antibiotics were administered.  General LMA anesthesia induced.  Patient placed into a low lithotomy position.  Sterile field was created by prepping and draping the patient's vagina, introitus, and proximal thighs using iodine x3.   Next, cystourethroscopy was performed using a 21-French rigid cystoscope with offset lens.  Inspection of bladder revealed no diverticula, calcifications, papillary lesions.  Distal end of right ureteral stent was in situ with moderate encrustation.  It was grasped proximal of the urethral meatus.  A 0.038 zip wire was advanced into the lower pole.  The stent was exchanged for a 6-French end hole catheter and right retrograde pyelogram was obtained.  Right retrograde pyelogram demonstrated a single right ureter with single-system right kidney.  There was a mild hydroureteronephrosis to an approximately 3-4 cm segment, very high-grade appearing stricture at the level of the iliac vessels and below.  The zip wire was once again advanced to the upper pole, a new 6 x 24 Polaris type stent was then placed using fluoroscopic guidance.  Good proximal and distal deployment were noted.  Bladder was emptied per cystoscope.  Procedure then terminated.  The patient tolerated the procedure well.  There were no immediate periprocedural complications.  The patient was taken to the postanesthesia care in stable condition.          ______________________________ Alexis Frock, MD     TM/MEDQ  D:  03/10/2016  T:  03/10/2016  Job:  [id number]

## 2016-03-10 NOTE — Anesthesia Procedure Notes (Signed)
Procedure Name: LMA Insertion Date/Time: 03/10/2016 8:34 AM Performed by: Nilda Simmer Pre-anesthesia Checklist: Patient identified, Emergency Drugs available, Suction available and Patient being monitored Patient Re-evaluated:Patient Re-evaluated prior to inductionOxygen Delivery Method: Circle system utilized Preoxygenation: Pre-oxygenation with 100% oxygen Intubation Type: IV induction Ventilation: Mask ventilation without difficulty LMA: LMA inserted LMA Size: 4.0 Number of attempts: 1 Airway Equipment and Method: Bite block Placement Confirmation: positive ETCO2 Tube secured with: Tape Dental Injury: Teeth and Oropharynx as per pre-operative assessment

## 2016-03-10 NOTE — Discharge Instructions (Signed)
°Post Anesthesia Home Care Instructions ° °Activity: °Get plenty of rest for the remainder of the day. A responsible adult should stay with you for 24 hours following the procedure.  °For the next 24 hours, DO NOT: °-Drive a car °-Operate machinery °-Drink alcoholic beverages °-Take any medication unless instructed by your physician °-Make any legal decisions or sign important papers. ° °Meals: °Start with liquid foods such as gelatin or soup. Progress to regular foods as tolerated. Avoid greasy, spicy, heavy foods. If nausea and/or vomiting occur, drink only clear liquids until the nausea and/or vomiting subsides. Call your physician if vomiting continues. ° °Special Instructions/Symptoms: °Your throat may feel dry or sore from the anesthesia or the breathing tube placed in your throat during surgery. If this causes discomfort, gargle with warm salt water. The discomfort should disappear within 24 hours. ° °If you had a scopolamine patch placed behind your ear for the management of post- operative nausea and/or vomiting: ° °1. The medication in the patch is effective for 72 hours, after which it should be removed.  Wrap patch in a tissue and discard in the trash. Wash hands thoroughly with soap and water. °2. You may remove the patch earlier than 72 hours if you experience unpleasant side effects which may include dry mouth, dizziness or visual disturbances. °3. Avoid touching the patch. Wash your hands with soap and water after contact with the patch. °  °[facility] °[phone] °Post Ureteroscopy With or Without Stent Instructions ° °Definitions: ° °Ureter: The duct that transports urine from the kidney to the bladder. °Stent:   A plastic hollow tube that is placed into the ureter, from the kidney to the                 bladder to prevent the ureter from swelling shut. ° °GENERAL INSTRUCTIONS: ° °Despite the fact that no skin incisions were used, the area around the ureter and bladder is raw  and irritated. The stent is a foreign body which will further irritate the bladder wall. This irritation is manifested by increased frequency of urination, both day and night, and by an increase in the urge to urinate. In some, the urge to urinate is present almost always. Sometimes the urge is strong enough that you may not be able to stop yourself from urinating. The only real cure is to remove the stent and then give time for the bladder wall to heal which can't be done until the danger of the ureter swelling shut has passed, which varies. ° °You may see some blood in your urine while the stent is in place and a few days afterwards. Do not be alarmed, even if the urine was clear for a while. Get off your feet and drink lots of fluids until clearing occurs. If you start to pass clots or don't improve, call us. ° °DIET: °You may return to your normal diet immediately. Because of the raw surface of your bladder, alcohol, spicy foods, acid type foods and drinks with caffeine may cause irritation or frequency and should be used in moderation. To keep your urine flowing freely and to avoid constipation, drink plenty of fluids during the day ( 8-10 glasses ). °Tip: Avoid cranberry juice because it is very acidic. ° °ACTIVITY: °Your physical activity doesn't need to be restricted. However, if you are very active, you may see some blood in your urine. We suggest that you reduce your activity under these circumstances until the bleeding has stopped. ° °BOWELS: °  It is important to keep your bowels regular during the postoperative period. Straining with bowel movements can cause bleeding. A bowel movement every other day is reasonable. Use a mild laxative if needed, such as Milk of Magnesia 2-3 tablespoons, or 2 Dulcolax tablets. Call if you continue to have problems. If you have been taking narcotics for pain, before, during or after your surgery, you may be constipated. Take a laxative if necessary.   MEDICATION: You  should resume your pre-surgery medications unless told not to. In addition you will often be given an antibiotic to prevent infection. These should be taken as prescribed until the bottles are finished unless you are having an unusual reaction to one of the drugs.  PROBLEMS YOU SHOULD REPORT TO [place]:  Fevers over 100.5 Fahrenheit.  Heavy bleeding, or clots ( See above notes about blood in urine ).  Inability to urinate.  Drug reactions ( hives, rash, nausea, vomiting, diarrhea ).  Severe burning or pain with urination that is not improving.  FOLLOW-UP: You will need a follow-up appointment to monitor your progress. Call for this appointment at the number listed above. Usually the first appointment will be about three to fourteen days after your surgery.     1 - You may have urinary urgency (bladder spasms) and bloody urine on / off with stent in place. This is normal.  2 - Call MD or go to ER for fever >102, severe pain / nausea / vomiting not relieved by medications, or acute change in medical status

## 2016-03-10 NOTE — H&P (Signed)
[name] is an 60 y.o. female.    Chief Complaint: Pre-op RIGHT ureteral stent exchange  HPI:   1 - Bladder Injury - s/p cystotomy repair and bilateral JJ stent placement by GYN oncology team 11/2014 at time of pelvic exenteration / end colostomy for metastatic GYN cancer. F/u imaging with CT 12/30/14 w/o bladder leak and stents in good position. Most recent Cr <1.5.  2 - Metastatic Endometrial Cancer - s/p repeat resection [phone] with colon and vaginal involvment but negative nodes / margins. Had adjuvant chemo-XRT which she is now done with. Follows with Dr. Alvy Bimler.   3 - Right Ureteral Stricture - New Rt hydro by CT 03/2015 to distal ureter on eval flank pain.  Recent Course: 03/2015 - Operative cysto / ureteroscopy c/w likley 2-3cm stricture (distal end about 3cm proximal to UO, balloon to 67F, 8x24 JJ stent placed)  04/2015 - Ureteroscopy patent ureter, JJ stent removed  08/2015 -Rt 5x24 polaris stent 09/2015 11/2015 -   PMH sig for DM2, morbid obesity, breast cancer, ovarian cancer, endometrial cancer. Her PCP is [name].  Today "[name]" is seen to proceed with right stent change. No interval fevers, most recent UA withtou infectiuos parameters.    Past Medical History:  Diagnosis Date  . Anemia in neoplastic disease   . Benign essential HTN   . 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 Cancer of corpus uteri, except isthmus [facility]) dx 10-15-2004 oncologist-- dr Denman [name] and dr Alvy Bimler     dx endometrial and ovarian cancer s/p  chemotheapy and surgery:  recurrent 11-19-2014 w/ radiation 01-29-2015 to 03-10-2015  . Chronic idiopathic neutropenia (HCC)    presumed related to chemotherapy March 2006--- followed by dr Alvy Bimler   . Complication of anesthesia    PONV  . Diabetic retinopathy,  background ([place])   . DM type 2 (diabetes mellitus, type 2) ([place])    monitored by dr Legrand Como altheimer  . Dysuria   . GERD (gastroesophageal reflux disease)    takes omeprazole  . Hiatal hernia   . History of bronchitis   . History of gastric polyp    2014  duodenum  . History of radiation therapy    01-29-2015 to 03-10-2015  pelvis 50.4Gy  . Hydronephrosis of right kidney    urologist-  dr Tresa Moore  . Hypothyroidism    monitored by dr Legrand Como altheimer  . Mixed dyslipidemia   . Multiple thyroid nodules    Managed by Dr. Harlow Asa  . PONV (postoperative nausea and vomiting)    "scopolamine patch works for me"  . Radiation-induced dermatitis    contact dermatitis , radiation completed, rash only on ankles now.  . Right flank pain   . S/P colostomy ([place])   . Seasonal allergies   . Shoulder stiffness    bilateral   . Vitamin D deficiency   . Wears glasses     Past Surgical History:  Procedure Laterality Date  . APPENDECTOMY    . BREAST RECONSTRUCTION WITH PLACEMENT OF TISSUE EXPANDER AND FLEX HD (ACELLULAR HYDRATED DERMIS) Bilateral 12/11/2015   Procedure: BILATERAL BREAST RECONSTRUCTION WITH PLACEMENT OF TISSUE EXPANDERS;  Surgeon: Irene Limbo, MD;  Location: [place];  Service: Plastics;  Laterality: Bilateral;  . COLONOSCOPY WITH PROPOFOL N/A 08/21/2013   Procedure: COLONOSCOPY WITH PROPOFOL;  Surgeon: Youlanda Mighty Buccini,  MD;  [facility];  Service: Endoscopy;  Laterality: N/A;  . COLOSTOMY TAKEDOWN N/A 12/04/2014   Procedure: LAPROSCOPIC LYSIS OF ADHESIONS, SPLENIC MOBILIZATION, RELOCATION OF COLOSTOMY, DEBRIDEMENT INITIAL COLOSTOMY SITE;  Surgeon: Michael [name], MD;  [facility]: [facility];  Service: General;  Laterality: N/A;  . CYSTOSCOPY W/ RETROGRADES Right 11/21/2015   Procedure: CYSTOSCOPY WITH RETROGRADE PYELOGRAM;  Surgeon: Alexis Frock, MD;  [facility]: [facility];  Service: Urology;  Laterality: Right;  . CYSTOSCOPY W/ URETERAL STENT PLACEMENT Right 11/21/2015   Procedure:  CYSTOSCOPY WITH STENT REPLACEMENT;  Surgeon: Alexis Frock, MD;  [facility];  Service: Urology;  Laterality: Right;  . CYSTOSCOPY WITH RETROGRADE PYELOGRAM, URETEROSCOPY AND STENT PLACEMENT Right 03/20/2015   Procedure: CYSTOSCOPY WITH RETROGRADE PYELOGRAM, URETEROSCOPY WITH BALLOON DILATION AND STENT PLACEMENT ON RIGHT;  Surgeon: Alexis Frock, MD;  Location: [facility];  Service: Urology;  Laterality: Right;  . CYSTOSCOPY WITH RETROGRADE PYELOGRAM, URETEROSCOPY AND STENT PLACEMENT Right 05/02/2015   Procedure: CYSTOSCOPY WITH RIGHT RETROGRADE PYELOGRAM,  DIAGNOSTIC URETEROSCOPY AND STENT PULL ;  Surgeon: Alexis Frock, MD;  [facility];  Service: Urology;  Laterality: Right;  . CYSTOSCOPY WITH RETROGRADE PYELOGRAM, URETEROSCOPY AND STENT PLACEMENT Right 09/05/2015   Procedure: CYSTOSCOPY WITH RETROGRADE PYELOGRAM,  AND STENT PLACEMENT;  Surgeon: Alexis Frock, MD;  [facility];  Service: Urology;  Laterality: Right;  . EUS N/A 10/02/2014   Procedure: LOWER ENDOSCOPIC ULTRASOUND (EUS);  Surgeon: Arta Silence, MD;  Location: [facility];  Service: Endoscopy;  Laterality: N/A;  . EXCISION SOFT TISSUE MASS RIGHT FOREMAN  12-08-2006  . EYE SURGERY  as child   pytosis of eyelids repair  . INCISION AND DRAINAGE OF WOUND Bilateral 12/26/2015   Procedure: DEBRIDEMENT OF BILATERAL MASTECTOMY FLAPS;  Surgeon: Irene Limbo, MD;  Location: [place];  Service: Plastics;  Laterality: Bilateral;  . LAPAROSCOPIC CHOLECYSTECTOMY  1990  . MASTECTOMY Right 12/11/2015   PROPHYLACTIC   . MASTECTOMY COMPLETE / SIMPLE W/ SENTINEL NODE BIOPSY Left 12/11/2015   AXILLARY SENTINEL LYMPH NODE BIOPSY   . MASTECTOMY W/ SENTINEL NODE BIOPSY Bilateral 12/11/2015   Procedure: RIGHT PROPHYLACTIC MASTECTOMY, LEFT TOTAL MASTECTOMY WITH LEFT AXILLARY SENTINEL LYMPH NODE BIOPSY;  Surgeon: Stark Klein, MD;  Location: [place];  Service: General;  Laterality:  Bilateral;  . OSTOMY N/A 11/19/2014   Procedure: OSTOMY;  Surgeon: Michael [name], MD;  [facility];  Service: General;  Laterality: N/A;  . ROBOTIC ASSISTED LAP VAGINAL HYSTERECTOMY N/A 11/19/2014   Procedure: ROBOTIC LYSIS OF ADHESIONS, CONVERTED TO LAPAROTOMY RADICAL UPPER VAGINECTOMY,LOW ANTERIOR BOWEL RESECTION, COLOSTOMY, BILATERAL URETERAL STENT PLACEMENT AND CYSTONOMY CLOSURE;  Surgeon: Everitt Amber, MD;  Location[facility];  Service: Gynecology;  Laterality: N/A;  . TISSUE EXPANDER FILLING Bilateral 12/26/2015   Procedure: EXPANSION OF BILATERAL CHEST TISSUE EXPANDERS (60 mL- Right; 75 mL- Left);  Surgeon: Irene Limbo, MD;  Location: [place];  Service: Plastics;  Laterality: Bilateral;  . TONSILLECTOMY    . TOTAL ABDOMINAL HYSTERECTOMY  March 2006   [facility]   and Bilateral Salpingoophorectomy/  staging for Ovarian cancer/  an    Family History  Problem Relation Age of Onset  . Cancer Mother 28    stomach ca  . Hypertension Mother   . Cancer Father 49    prostate ca  . Diabetes Father   . Heart disease Father     CABG  . Breast cancer Maternal Aunt     dx in her 76s  .  Lymphoma Paternal Aunt   . Brain cancer Paternal Grandfather   . Ovarian cancer Other   . Diabetes Sister   . Hypertension Brother y-10  . Heart disease Brother     CABG  . Diabetes Brother    Social History:  reports that she has never smoked. She has never used smokeless tobacco. She reports that she drinks alcohol. She reports that she does not use drugs.  Allergies:  Allergies  Allergen Reactions  . Penicillins Swelling    Facial swelling Has patient had a PCN reaction causing immediate rash, facial/tongue/throat swelling, SOB or lightheadedness with hypotension: Yes Has patient had a PCN reaction causing severe rash involving mucus membranes or skin necrosis: Yes Has patient had a PCN reaction that required hospitalization No Has patient had a PCN reaction occurring within the  last 10 years: No If all of the above answers are "NO", then may proceed with Cephalosporin use.   [name] Kitchen Ultram [Tramadol] Hives  . Adhesive [Tape]     blisters  . Cefaclor Rash    Ceclor  . Erythromycin     Gastritis, abd cramps  . Trimethoprim Rash  . Pectin Rash    Pectin ring for stoma  . Sulfa Antibiotics Rash    No prescriptions prior to admission.    No results found for this or any previous visit (from the past 48 hour(s)). No results found.  Review of Systems  Constitutional: Negative.  Negative for fever.  HENT: Negative.   Eyes: Negative.   Respiratory: Negative.   Cardiovascular: Negative.   Gastrointestinal: Negative.   Genitourinary: Positive for flank pain.  Skin: Negative.   Neurological: Negative.   Endo/Heme/Allergies: Negative.   Psychiatric/Behavioral: Negative.     There were no vitals taken for this visit. Physical Exam  Constitutional: She is oriented to person, place, and time. She appears well-developed.  HENT:  Head: Normocephalic.  Eyes: Pupils are equal, round, and reactive to light.  Neck: Normal range of motion.  Cardiovascular: Normal rate.   Respiratory: Effort normal.  GI: Soft.  Sgnificant, stable truncal obesity  Genitourinary:  Genitourinary Comments: No CVAT at present  Musculoskeletal: Normal range of motion.  Neurological: She is alert and oriented to person, place, and time.  Skin: Skin is warm.  Psychiatric: She has a normal mood and affect. Her behavior is normal. Judgment and thought content normal.     Assessment/Plan  Proceed as planned with right ureteral stent exchange. She is not candidate for any sort of formal reconstruciton until cancer free for at least 2 years.   Alexis Frock, MD 03/10/2016, 5:17 AM

## 2016-03-10 NOTE — Brief Op Note (Signed)
03/10/2016  8:48 AM  PATIENT:  [name] Scrivener  60 y.o. female  PRE-OPERATIVE DIAGNOSIS:  RIGHT FLANK PAIN  POST-OPERATIVE DIAGNOSIS:  RIGHT FLANK PAIN  PROCEDURE:  Procedure(s): CYSTOSCOPY WITH STENT REPLACEMENT (Right)  SURGEON:  Surgeon(s) and Role:    * Alexis Frock, MD - Primary  PHYSICIAN ASSISTANT:   ASSISTANTS: none   ANESTHESIA:   general  EBL:  No intake/output data recorded.  BLOOD ADMINISTERED:none  DRAINS: none   LOCAL MEDICATIONS USED:  NONE  SPECIMEN:  No Specimen  DISPOSITION OF SPECIMEN:  N/A  COUNTS:  YES  TOURNIQUET:  * No tourniquets in log *  DICTATION: .Other Dictation: Dictation Number [id number]  PLAN OF CARE: Discharge to home after PACU  PATIENT DISPOSITION:  PACU - hemodynamically stable.   Delay start of Pharmacological VTE agent (>24hrs) due to surgical blood loss or risk of bleeding: yes

## 2016-03-10 NOTE — Transfer of Care (Signed)
  Last Vitals:  Vitals:   03/10/16 0703 03/10/16 0900  BP: 128/68 122/66  Pulse: 64 66  Resp: 16 11  Temp: 36.9 C 36.6 C    Last Pain:  Vitals:   03/10/16 0740  TempSrc:   PainSc: 2       Patients Stated Pain Goal: 5 (03/10/16 0740)  Immediate Anesthesia Transfer of Care Note  Patient: [name]  Procedure(s) Performed: Procedure(s) (LRB): CYSTOSCOPY WITH STENT REPLACEMENT (Right)  Patient Location: PACU  Anesthesia Type: General  Level of Consciousness: awake, alert  and oriented  Airway & Oxygen Therapy: Patient Spontanous Breathing and Patient connected to nasal cannula oxygen  Post-op Assessment: Report given to PACU RN and Post -op Vital signs reviewed and stable  Post vital signs: Reviewed and stable  Complications: No apparent anesthesia complications

## 2016-03-10 NOTE — Op Note (Deleted)
  The note originally documented on this encounter has been moved the the encounter in which it belongs.  

## 2016-03-10 NOTE — Anesthesia Postprocedure Evaluation (Signed)
Anesthesia Post Note  Patient: Financial planner  Procedure(s) Performed: Procedure(s) (LRB): CYSTOSCOPY WITH STENT REPLACEMENT (Right)  Patient location during evaluation: PACU Anesthesia Type: General Level of consciousness: awake and alert Pain management: pain level controlled Vital Signs Assessment: post-procedure vital signs reviewed and stable Respiratory status: spontaneous breathing, nonlabored ventilation and respiratory function stable Cardiovascular status: blood pressure returned to baseline and stable Postop Assessment: no signs of nausea or vomiting Anesthetic complications: no    Last Vitals:  Vitals:   03/10/16 0915 03/10/16 0930  BP: (!) 106/53 (!) 117/58  Pulse: 60 61  Resp: 14 18  Temp:      Last Pain:  Vitals:   03/10/16 0940  TempSrc:   PainSc: 0-No pain                 Nilda Simmer

## 2016-03-11 ENCOUNTER — Telehealth: Payer: Self-pay | Admitting: Hematology and Oncology

## 2016-03-11 ENCOUNTER — Other Ambulatory Visit (HOSPITAL_BASED_OUTPATIENT_CLINIC_OR_DEPARTMENT_OTHER): Payer: BLUE CROSS/BLUE SHIELD

## 2016-03-11 ENCOUNTER — Ambulatory Visit (HOSPITAL_BASED_OUTPATIENT_CLINIC_OR_DEPARTMENT_OTHER): Payer: BLUE CROSS/BLUE SHIELD | Admitting: Hematology and Oncology

## 2016-03-11 ENCOUNTER — Encounter (HOSPITAL_BASED_OUTPATIENT_CLINIC_OR_DEPARTMENT_OTHER): Payer: Self-pay | Admitting: Urology

## 2016-03-11 VITALS — BP 117/63 | HR 64 | Temp 97.9°F | Resp 17 | Wt 201.4 lb

## 2016-03-11 DIAGNOSIS — D709 Neutropenia, unspecified: Secondary | ICD-10-CM | POA: Diagnosis not present

## 2016-03-11 DIAGNOSIS — Z8542 Personal history of malignant neoplasm of other parts of uterus: Secondary | ICD-10-CM

## 2016-03-11 DIAGNOSIS — Z87440 Personal history of urinary (tract) infections: Secondary | ICD-10-CM

## 2016-03-11 DIAGNOSIS — Z170 Estrogen receptor positive status [ER+]: Secondary | ICD-10-CM

## 2016-03-11 DIAGNOSIS — N1330 Unspecified hydronephrosis: Secondary | ICD-10-CM | POA: Diagnosis not present

## 2016-03-11 DIAGNOSIS — C50212 Malignant neoplasm of upper-inner quadrant of left female breast: Secondary | ICD-10-CM

## 2016-03-11 DIAGNOSIS — Z8543 Personal history of malignant neoplasm of ovary: Secondary | ICD-10-CM

## 2016-03-11 DIAGNOSIS — N390 Urinary tract infection, site not specified: Secondary | ICD-10-CM

## 2016-03-11 LAB — CBC WITH DIFFERENTIAL/PLATELET
BASO%: 0.6 % (ref 0.0–2.0)
Basophils Absolute: 0 10*3/uL (ref 0.0–0.1)
EOS ABS: 0.1 10*3/uL (ref 0.0–0.5)
EOS%: 1.1 % (ref 0.0–7.0)
HEMATOCRIT: 34 % — AB (ref 34.8–46.6)
HGB: 10.4 g/dL — ABNORMAL LOW (ref 11.6–15.9)
LYMPH#: 1.1 10*3/uL (ref 0.9–3.3)
LYMPH%: 20.7 % (ref 14.0–49.7)
MCH: 25.4 pg (ref 25.1–34.0)
MCHC: 30.6 g/dL — AB (ref 31.5–36.0)
MCV: 82.9 fL (ref 79.5–101.0)
MONO#: 0.7 10*3/uL (ref 0.1–0.9)
MONO%: 12.5 % (ref 0.0–14.0)
NEUT%: 65.1 % (ref 38.4–76.8)
NEUTROS ABS: 3.4 10*3/uL (ref 1.5–6.5)
PLATELETS: 256 10*3/uL (ref 145–400)
RBC: 4.1 10*6/uL (ref 3.70–5.45)
RDW: 17 % — ABNORMAL HIGH (ref 11.2–14.5)
WBC: 5.3 10*3/uL (ref 3.9–10.3)

## 2016-03-11 MED ORDER — LEVOFLOXACIN 500 MG PO TABS: 500.0000 mg | ORAL_TABLET | Freq: Every day | ORAL | 0 refills | Status: DC

## 2016-03-11 NOTE — Telephone Encounter (Signed)
Gave patient avs report and appointments for February 2018.

## 2016-03-11 NOTE — Assessment & Plan Note (Signed)
-  Recent CT scan showed no evidence of cancer recurrence. She have close follow-up with gynecologist oncologist.

## 2016-03-11 NOTE — Assessment & Plan Note (Signed)
She had urinary stents placement for right hydronephrosis. She denies recent intermittent flank pain. She denies dysuria, fever or chills. Due to high risk recurrent UTI and the patient going out of town, I will give her prescription of antibiotic to hang onto but I suspect she'll be all right since the stent is just recently changed

## 2016-03-11 NOTE — Progress Notes (Signed)
[place] OFFICE PROGRESS NOTE  Patient Care Team: Ann Held, DO as PCP - General (Family Medicine) Heath Lark, MD as Consulting Physician (Hematology and Oncology) Everitt Amber, MD as Consulting Physician (Obstetrics and Gynecology) Michael [name], MD as Consulting Physician (General Surgery) Alexis Frock, MD as Consulting Physician (Urology) Lorne Skeens, MD as Consulting Physician (Endocrinology) Katy Apo, MD as Consulting Physician (Ophthalmology) Stark Klein, MD as Consulting Physician (General Surgery) Irene Limbo, MD as Consulting Physician (Plastic Surgery)  SUMMARY OF ONCOLOGIC HISTORY: Oncology History   Breast cancer of upper-inner quadrant of left female breast [facility])   Staging form: Breast, AJCC 7th Edition     Clinical stage from 12/31/2015: Stage IIA (T2, N0, M0) - Signed by Heath Lark, MD on 12/31/2015  ER,PR positive Her2 neg     History of ovarian & endometrial cancer   10/15/2004 Initial Diagnosis    History of ovarian cancer, treated with chemotherapy carbo/Taxol     03/17/2009 Bone Marrow Biopsy    Bone marrow biopsy at [facility] showed neutropenia     01/23/2014 Bone Marrow Biopsy    Repeat bone marrow biopsy showed neutropenia     09/12/2014 Imaging    CT scan showed possible cancer     10/14/2014 Imaging    MRI show significant pelvic mass without invasion into the bladder but abutting to the rectum     11/19/2014 Surgery    she underwent surgery and had robotic-assisted lysis of adhesions, converted to laparotomy, radical upper vaginectomy and low anterior resection with colostomy. 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 Resection margins were negative     01/29/2015 - 03/10/2015 Radiation Therapy    She received  adjuvant radiation     03/03/2015 Imaging    CT scan of the abdomen and pelvis showed status post interval removal of the bilateral nephroureteral stents. Worsening moderate right hydroureteronephrosis. Resolved left hydroureteronephrosis.     03/20/2015 Procedure    she had cystoscopy and stent placement for right hydronephrosis     05/02/2015 Surgery    Cystoscopy with right retrograde pyelogram interpretation. Diagnostic ureteroscopy. Removal of right ureteral stent.     05/14/2015 Imaging    CT scan of the abdomen and pelvis show unilateral right hydronephrosis with no residual cancer     08/18/2015 Imaging    CT scan showed hysterectomy with stable presacral soft tissue thickening. No definitive evidence of recurrent or metastatic disease. Severe right hydronephrosis     09/12/2015 Surgery    Cystoscopy with right retrograde pyelogram interpretation. Right ureteral stent placement 5 x 24 Polaris, no tether      Breast cancer of upper-inner quadrant of left female breast ([place])   10/14/2015 Imaging    Screening mammogram showed possible distortion in the left breast.     10/24/2015 Imaging    Targeted ultrasound is performed, showing a 0.6 x 0.8 x 0.9 cm area of hypoechoic distortion at the 10 o'clock position of the left breast 5 cm from the nipple     10/24/2015 Imaging    Diagnostic imaging confirmed 0.6 x 0.8 x 0.9 cm distortion in the upper inner left breast     10/30/2015 Procedure    Left US guided biopsy was performed     10/30/2015 Pathology Results    Accession: [id number] showed invasive ductal  carcinoma, ER/PR positive, Her2 neg     12/11/2015 Surgery    She undwerwent bilateral mastectomy, left SLN biopsy and immediate reconstruction surgeries with bilateral plasma expanders     12/11/2015 Pathology Results    Accession: [id number] mastectomy specimens showed left breast ca, pT2N0M0     12/26/2015 Pathology Results    Accession: [id number] specimen from debridement  showed no cancer     12/26/2015 Surgery    she underwent surgical debridement of necrotic tissue at site of plasma expander     01/12/2016 Imaging    Bone density is normal     02/02/2016 -  Anti-estrogen oral therapy    She started taking Arimidex      INTERVAL HISTORY: Please see below for problem oriented charting. She feels well. Denies major side effects from Arimidex. Very trace minor hot flashes. No myalgias, arthralgias mood swings or depression. She had urinary stent exchange recently. She denies further infection from her mastectomy site. She has some mild chest tightness and has benefited from outpatient physical therapy. She denies lymphedema   REVIEW OF SYSTEMS:   Constitutional: Denies fevers, chills or abnormal weight loss Eyes: Denies blurriness of vision Ears, nose, mouth, throat, and face: Denies mucositis or sore throat Respiratory: Denies cough, dyspnea or wheezes Cardiovascular: Denies palpitation, chest discomfort or lower extremity swelling Gastrointestinal:  Denies nausea, heartburn or change in bowel habits Skin: Denies abnormal skin rashes Lymphatics: Denies new lymphadenopathy or easy bruising Neurological:Denies numbness, tingling or new weaknesses Behavioral/Psych: Mood is stable, no new changes  All other systems were reviewed with the patient and are negative.  I have reviewed the past medical history, past surgical history, social history and family history with the patient and they are unchanged from previous note.  ALLERGIES:  is allergic to penicillins; ultram [tramadol]; adhesive [tape]; cefaclor; erythromycin; trimethoprim; pectin; and sulfa antibiotics.  MEDICATIONS:  Current Outpatient Prescriptions  Medication Sig Dispense Refill  . anastrozole (ARIMIDEX) 1 MG tablet Take 1 tablet (1 mg total) by mouth daily. (Patient taking differently: Take 1 mg by mouth every morning. ) 90 tablet 3  . Biotin 5 MG TABS Take 5 mg by mouth every morning.      . Canagliflozin (INVOKANA) 300 MG TABS Take 300 mg by mouth every morning.     . Cholecalciferol (VITAMIN D3) 10000 UNITS capsule Take 10,000 Units by mouth once a week. Sunday evening's    . diphenhydrAMINE (BENADRYL) 25 MG tablet Take 25 mg by mouth every 4 (four) hours as needed for itching, allergies or sleep. Reported on 09/15/2015    . filgrastim (NEUPOGEN) 480 MCG/1.6ML injection Inject 1.6 ml under the skin every 3 days for life 16 mL 11  . hyaluronate sodium (RADIAPLEXRX) GEL Apply 1 application topically 2 (two) times daily as needed (scars). Reported on 09/15/2015    . levothyroxine (SYNTHROID) 175 MCG tablet Take 87.5-175 mcg by mouth daily before breakfast. 167mg daily except 87.535m on Sundays.    . [name] Kitchenoratadine (CLARITIN) 10 MG tablet Take 10 mg by mouth every morning.     . metFORMIN (GLUCOPHAGE) 1000 MG tablet Take 1,000 mg by mouth 2 (two) times daily with a meal.     . methocarbamol (ROBAXIN) 500 MG tablet Take 1 tablet (500 mg total) by mouth every 8 (eight) hours as needed for muscle spasms. 30 tablet 0  . Multiple Vitamin (MULTIVITAMIN WITH MINERALS) TABS tablet Take 1 tablet by mouth daily.    . [name] Kitchenmega-3 acid ethyl esters (LOVAZA) 1  G capsule Take 1 g by mouth 2 (two) times daily.    [name] Kitchen omeprazole (PRILOSEC) 20 MG capsule Take 20 mg by mouth at bedtime. Other other night    . Polyethyl Glycol-Propyl Glycol (SYSTANE OP) Place 1 drop into both eyes at bedtime.     . polyethylene glycol (MIRALAX / GLYCOLAX) packet Take 17 g by mouth daily as needed. Reported on 01/19/2016    . rosuvastatin (CRESTOR) 10 MG tablet Take 10 mg by mouth every evening.     . sitaGLIPtin (JANUVIA) 100 MG tablet Take 100 mg by mouth every morning.     . valsartan (DIOVAN) 80 MG tablet Take 1 tablet (80 mg total) by mouth daily. (Patient taking differently: Take 80 mg by mouth every morning. )    . levofloxacin (LEVAQUIN) 500 MG tablet Take 1 tablet (500 mg total) by mouth daily. 10 tablet 0  . oxyCODONE  (OXY IR/ROXICODONE) 5 MG immediate release tablet Take 1-2 tablets (5-10 mg total) by mouth every 4 (four) hours as needed for moderate pain (post-operatively). (Patient not taking: Reported on 03/11/2016) 10 tablet 0   No current facility-administered medications for this visit.     PHYSICAL EXAMINATION: ECOG PERFORMANCE STATUS: 0 - Asymptomatic  Vitals:   03/11/16 1508  BP: 117/63  Pulse: 64  Resp: 17  Temp: 97.9 F (36.6 C)   Filed Weights   03/11/16 1508  Weight: 201 lb 6.4 oz (91.4 kg)    GENERAL:alert, no distress and comfortable SKIN: skin color, texture, turgor are normal, no rashes or significant lesions EYES: normal, Conjunctiva are pink and non-injected, sclera clear OROPHARYNX:no exudate, no erythema and lips, buccal mucosa, and tongue normal  NECK: supple, thyroid normal size, non-tender, without nodularity LYMPH:  no palpable lymphadenopathy in the cervical, axillary or inguinal LUNGS: clear to auscultation and percussion with normal breathing effort HEART: regular rate & rhythm and no murmurs and no lower extremity edema ABDOMEN:abdomen soft, non-tender and normal bowel sounds. Colostomy site looks okay Musculoskeletal:no cyanosis of digits and no clubbing  NEURO: alert & oriented x 3 with fluent speech, no focal motor/sensory deficits Well-healed bilateral reconstruction surgery. No other palpable abnormalities. Possible expanders in situ LABORATORY DATA:  I have reviewed the data as listed    Component Value Date/Time   NA 142 03/10/2016 0726   NA 142 11/26/2015 1020   K 3.5 03/10/2016 0726   K 3.7 11/26/2015 1020   CL 101 03/10/2016 0726   CO2 31 02/26/2016 1002   CO2 28 11/26/2015 1020   GLUCOSE 117 (H) 03/10/2016 0726   GLUCOSE 117 11/26/2015 1020   BUN 21 (H) 03/10/2016 0726   BUN 14.4 11/26/2015 1020   CREATININE 0.80 03/10/2016 0726   CREATININE 1.1 11/26/2015 1020   CALCIUM 10.0 02/26/2016 1002   CALCIUM 9.4 11/26/2015 1020   PROT 7.7  02/26/2016 1002   PROT 7.3 11/26/2015 1020   ALBUMIN 4.1 02/26/2016 1002   ALBUMIN 2.8 (L) 11/26/2015 1020   AST 12 02/26/2016 1002   AST 13 11/26/2015 1020   ALT 12 02/26/2016 1002   ALT 18 11/26/2015 1020   ALKPHOS 70 02/26/2016 1002   ALKPHOS 86 11/26/2015 1020   BILITOT 0.4 02/26/2016 1002   BILITOT <0.30 11/26/2015 1020   GFRNONAA 57 (L) 12/12/2015 0631   GFRNONAA 78 12/16/2014 1530   GFRAA >60 12/12/2015 0631   GFRAA >89 12/16/2014 1530    No results found for: SPEP, UPEP  Lab Results  Component Value Date  WBC 5.3 03/11/2016   NEUTROABS 3.4 03/11/2016   HGB 10.4 (L) 03/11/2016   HCT 34.0 (L) 03/11/2016   MCV 82.9 03/11/2016   PLT 256 03/11/2016      Chemistry      Component Value Date/Time   NA 142 03/10/2016 0726   NA 142 11/26/2015 1020   K 3.5 03/10/2016 0726   K 3.7 11/26/2015 1020   CL 101 03/10/2016 0726   CO2 31 02/26/2016 1002   CO2 28 11/26/2015 1020   BUN 21 (H) 03/10/2016 0726   BUN 14.4 11/26/2015 1020   CREATININE 0.80 03/10/2016 0726   CREATININE 1.1 11/26/2015 1020      Component Value Date/Time   CALCIUM 10.0 02/26/2016 1002   CALCIUM 9.4 11/26/2015 1020   ALKPHOS 70 02/26/2016 1002   ALKPHOS 86 11/26/2015 1020   AST 12 02/26/2016 1002   AST 13 11/26/2015 1020   ALT 12 02/26/2016 1002   ALT 18 11/26/2015 1020   BILITOT 0.4 02/26/2016 1002   BILITOT <0.30 11/26/2015 1020      ASSESSMENT & PLAN:  Breast cancer of upper-inner quadrant of left female breast ([place]) I reviewed with her and her husband report from recent surgical pathology report. Final staging is T2 N0 M0, ER/PR positive HER-2/neu negative left breast cancer, status post bilateral mastectomy. She does not need further local therapy such as radiation treatment. All margins were negative and SLN biopsies were negative. We discussed the role of adjuvant treatment. She tolerated Arimidex well without major side effects. We'll continue the same. I will see her back in 6  months  Chronic neutropenia Due to her history of recurrent infection, I recommend we titrate the dosage of Neupogen to keep a consistently greater than 1.5 in the perioperative studding. Her CBC today is satisfactory with adequate ANC. She will continue prophylactic injection every 6 days. Due to high risk of urinary tract infection and the patient's desire to travel out of town and out of state, I gave her prescription of levofloxacin to hang onto  History of ovarian & endometrial cancer -Recent CT scan showed no evidence of cancer recurrence. She have close follow-up with gynecologist oncologist.    Hydronephrosis, right She had urinary stents placement for right hydronephrosis. She denies recent intermittent flank pain. She denies dysuria, fever or chills. Due to high risk recurrent UTI and the patient going out of town, I will give her prescription of antibiotic to hang onto but I suspect she'll be all right since the stent is just recently changed   No orders of the defined types were placed in this encounter.  All questions were answered. The patient knows to call the clinic with any problems, questions or concerns. No barriers to learning was detected. I spent 20 minutes counseling the patient face to face. The total time spent in the appointment was 25 minutes and more than 50% was on counseling and review of test results     [facility], Austin Pongratz, MD 03/11/2016 4:10 PM

## 2016-03-11 NOTE — Assessment & Plan Note (Signed)
Due to her history of recurrent infection, I recommend we titrate the dosage of Neupogen to keep a consistently greater than 1.5 in the perioperative studding. Her CBC today is satisfactory with adequate ANC. She will continue prophylactic injection every 6 days. Due to high risk of urinary tract infection and the patient's desire to travel out of town and out of state, I gave her prescription of levofloxacin to hang onto

## 2016-03-11 NOTE — Assessment & Plan Note (Signed)
I reviewed with her and her husband report from recent surgical pathology report. Final staging is T2 N0 M0, ER/PR positive HER-2/neu negative left breast cancer, status post bilateral mastectomy. She does not need further local therapy such as radiation treatment. All margins were negative and SLN biopsies were negative. We discussed the role of adjuvant treatment. She tolerated Arimidex well without major side effects. We'll continue the same. I will see her back in 6 months

## 2016-03-12 ENCOUNTER — Encounter: Payer: BLUE CROSS/BLUE SHIELD | Admitting: Physical Therapy

## 2016-03-12 ENCOUNTER — Telehealth: Payer: Self-pay | Admitting: *Deleted

## 2016-03-12 DIAGNOSIS — C50212 Malignant neoplasm of upper-inner quadrant of left female breast: Secondary | ICD-10-CM

## 2016-03-12 NOTE — Telephone Encounter (Signed)
Called pt to assess needs. Relate doing well.

## 2016-03-17 DIAGNOSIS — D709 Neutropenia, unspecified: Secondary | ICD-10-CM | POA: Diagnosis not present

## 2016-03-17 DIAGNOSIS — Z853 Personal history of malignant neoplasm of breast: Secondary | ICD-10-CM | POA: Diagnosis not present

## 2016-03-17 DIAGNOSIS — Z9013 Acquired absence of bilateral breasts and nipples: Secondary | ICD-10-CM | POA: Diagnosis not present

## 2016-03-22 ENCOUNTER — Encounter: Payer: Self-pay | Admitting: Gynecologic Oncology

## 2016-03-22 ENCOUNTER — Ambulatory Visit: Payer: BLUE CROSS/BLUE SHIELD | Attending: Gynecologic Oncology | Admitting: Gynecologic Oncology

## 2016-03-22 VITALS — BP 116/62 | HR 77 | Temp 98.0°F | Resp 18 | Ht 63.0 in | Wt 196.7 lb

## 2016-03-22 DIAGNOSIS — Z807 Family history of other malignant neoplasms of lymphoid, hematopoietic and related tissues: Secondary | ICD-10-CM | POA: Insufficient documentation

## 2016-03-22 DIAGNOSIS — Z90710 Acquired absence of both cervix and uterus: Secondary | ICD-10-CM | POA: Diagnosis not present

## 2016-03-22 DIAGNOSIS — Z170 Estrogen receptor positive status [ER+]: Secondary | ICD-10-CM | POA: Diagnosis not present

## 2016-03-22 DIAGNOSIS — E782 Mixed hyperlipidemia: Secondary | ICD-10-CM | POA: Insufficient documentation

## 2016-03-22 DIAGNOSIS — E559 Vitamin D deficiency, unspecified: Secondary | ICD-10-CM | POA: Insufficient documentation

## 2016-03-22 DIAGNOSIS — K449 Diaphragmatic hernia without obstruction or gangrene: Secondary | ICD-10-CM | POA: Diagnosis not present

## 2016-03-22 DIAGNOSIS — N135 Crossing vessel and stricture of ureter without hydronephrosis: Secondary | ICD-10-CM | POA: Diagnosis not present

## 2016-03-22 DIAGNOSIS — N1330 Unspecified hydronephrosis: Secondary | ICD-10-CM | POA: Diagnosis not present

## 2016-03-22 DIAGNOSIS — Z853 Personal history of malignant neoplasm of breast: Secondary | ICD-10-CM | POA: Insufficient documentation

## 2016-03-22 DIAGNOSIS — Z8249 Family history of ischemic heart disease and other diseases of the circulatory system: Secondary | ICD-10-CM | POA: Insufficient documentation

## 2016-03-22 DIAGNOSIS — D709 Neutropenia, unspecified: Secondary | ICD-10-CM | POA: Diagnosis not present

## 2016-03-22 DIAGNOSIS — Z9013 Acquired absence of bilateral breasts and nipples: Secondary | ICD-10-CM | POA: Insufficient documentation

## 2016-03-22 DIAGNOSIS — Z808 Family history of malignant neoplasm of other organs or systems: Secondary | ICD-10-CM | POA: Diagnosis not present

## 2016-03-22 DIAGNOSIS — Z8041 Family history of malignant neoplasm of ovary: Secondary | ICD-10-CM | POA: Insufficient documentation

## 2016-03-22 DIAGNOSIS — C50912 Malignant neoplasm of unspecified site of left female breast: Secondary | ICD-10-CM | POA: Diagnosis not present

## 2016-03-22 DIAGNOSIS — Z8542 Personal history of malignant neoplasm of other parts of uterus: Secondary | ICD-10-CM | POA: Diagnosis not present

## 2016-03-22 DIAGNOSIS — Z8042 Family history of malignant neoplasm of prostate: Secondary | ICD-10-CM | POA: Diagnosis not present

## 2016-03-22 DIAGNOSIS — K219 Gastro-esophageal reflux disease without esophagitis: Secondary | ICD-10-CM | POA: Insufficient documentation

## 2016-03-22 DIAGNOSIS — Z923 Personal history of irradiation: Secondary | ICD-10-CM | POA: Diagnosis not present

## 2016-03-22 DIAGNOSIS — Z8719 Personal history of other diseases of the digestive system: Secondary | ICD-10-CM | POA: Insufficient documentation

## 2016-03-22 DIAGNOSIS — C549 Malignant neoplasm of corpus uteri, unspecified: Secondary | ICD-10-CM | POA: Diagnosis not present

## 2016-03-22 DIAGNOSIS — Z833 Family history of diabetes mellitus: Secondary | ICD-10-CM | POA: Insufficient documentation

## 2016-03-22 DIAGNOSIS — E039 Hypothyroidism, unspecified: Secondary | ICD-10-CM | POA: Insufficient documentation

## 2016-03-22 DIAGNOSIS — E11319 Type 2 diabetes mellitus with unspecified diabetic retinopathy without macular edema: Secondary | ICD-10-CM | POA: Diagnosis not present

## 2016-03-22 DIAGNOSIS — C7960 Secondary malignant neoplasm of unspecified ovary: Secondary | ICD-10-CM | POA: Diagnosis not present

## 2016-03-22 DIAGNOSIS — Z933 Colostomy status: Secondary | ICD-10-CM | POA: Insufficient documentation

## 2016-03-22 DIAGNOSIS — Z90722 Acquired absence of ovaries, bilateral: Secondary | ICD-10-CM | POA: Diagnosis not present

## 2016-03-22 DIAGNOSIS — Z800 Family history of malignant neoplasm of digestive organs: Secondary | ICD-10-CM | POA: Insufficient documentation

## 2016-03-22 DIAGNOSIS — Z9049 Acquired absence of other specified parts of digestive tract: Secondary | ICD-10-CM | POA: Diagnosis not present

## 2016-03-22 DIAGNOSIS — Z9889 Other specified postprocedural states: Secondary | ICD-10-CM | POA: Diagnosis not present

## 2016-03-22 DIAGNOSIS — I10 Essential (primary) hypertension: Secondary | ICD-10-CM | POA: Diagnosis not present

## 2016-03-22 DIAGNOSIS — Z960 Presence of urogenital implants: Secondary | ICD-10-CM | POA: Diagnosis not present

## 2016-03-22 NOTE — Patient Instructions (Signed)
Plan to follow up during the first week of December or sooner if needed.  Please call for any questions or concerns.

## 2016-03-22 NOTE — Progress Notes (Signed)
Recurrent Endometrial cancer FOLLOWUP VISIT  Assessment:    60 y.o. year old with recurrent endometrioid endometrial/ovarian cancer.   S/p exploratory laparotomy, posterior supralevator pelvic exenteration, end colostomy, bladder repair, ureteral stenting with complete resection and negative margins on 11/09/14. 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 S/p hospital admission 12/20/14 for urosepsis  S/p adjuvant radiation completed 03/10/15  Postoperative and post-radiation right hydroureter and distal ureteral obstruction.  No convincing evidence for recurrence/persistence of tumor on imaging and physical exam (complete clinical response).   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 No specific follow-up or surveillance recommended by genetics counselor.  Plan: 1) Recurrent endometrial/ovarian cancer: s/p complete resection with negative margins and s/p adjuvant radiation. No chemotherapy due to chronic idiopathic neutropenia.   CT imaging post treatment (January, 2017) shows stable thickening of upper vagina and right peri-urethral tissues. Given that there was no macroscopic disease at the completion of surgery and on pre-radiation imaging, I believe this is most likely radiation changes and postop changes. Will not order specific follow-up imaging from a cancer surveillance perspective. She may likely require imaging as part of follow-up of her right ureteral monitoring.  2) Right hydroureter and distal ureteral obstruction - secondary to post operative scaring and post-radiation changes. Appreciate Dr Zettie Pho assistance with dilation and stenting. Last stent exchange April 2017. Consideration for re-implantation and possibly colostomy reversal when she is 2+ years from diagnosis.  3) neutropenia - Dr Alvy Bimler is managing this and we appreciate.  4) breast cancer - stage IIA. ER/PR  positive. Treated with surgery and arimidex. Reconstruction planned for September 2017.  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 Will have patietn follow-up with Dr Cristina Gong after breast surgery for close colon cancer and upper GI cancer surveillance  6) Follow-up: I will see [name] for followup in December, 2017.  HPI:  [name] is a 60 y.o. 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  She has a history of endometrial and ovarian endometrioid carcinoma treated in 2006 by Dr. [name] at [facility] in [place]. Her surgery (TAH, BSO) was followed by adjuvant chemotherapy with carboplatin plus paclitaxel due to the ovarian involvement and the presence of a cul de sac lesion also positive for disease. She denies receiving adjuvant radiation therapy. She is unclear if she had metastatic endometrial cancer to the ovary or duel primaries. She had a complete response to therapy however developed leukopenia in July 2010. 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 She sees Dr. Alvy Bimler for this and is treated with G-CSF injections.  She began experiencing rectal pain approximately in January 2016. She also reports narrowing of caliber of the stool. She denies hematochezia. She does report approximately 3 months of vaginal spotting. She's had no specific follow-up for her gynecologic cancers in the past 4 years.  As part of workup of her rectal pain she underwent a CT scan of the abdomen and pelvis on 09/12/2014. This demonstrated a new right perirectal mass abutting the vaginal cuff measuring 3.8 x 4.9 cm. There is a limited fat plane between the mass and the rectum posteriorly. Rectal invasion could not be excluded. There were no other masses  identified in the abdomen and pelvis or lymphadenopathy. There is  no other evidence of metastatic disease or recurrent disease. There was no hydronephrosis. A CA-125 drawn on 09/12/2014 was normal at 10.  PET was negative for extrapelvic disease. MRI defined the lesion as a 3.5x5cm lesion to the right of the rectum at the vaginal cuff.  Colonoscopy was performed on 10/02/14 with transrectal [place] and biopsy and this revealed endometrioid adenocarcinoma. Of note, the lesion was not seen within the lumen of the rectum.   She then underwent a posterior supralevator exenteration with colostomy and bladder repair and stent placement on 2/35/57 without complications.  Her postoperative course was uncomplicate with the exception of development of postop anemia.  Her final pathology revealed endometrioid adenocarcinoma invading the vagina and rectum with negative margins on the specimen. The margin had been close (clinically) to the right pelvic sidewall which was marked with surgical clips.  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 It was treated with IV antibiotics and then on POD 15 she was taken to the OR for laparoscopic revision of the stoma with Dr Michael [name]. Postoperatively she had wound vac and packing for her stomal wound.  A retrograde cystogram on week 4 postop confirmed an intact bladder and the foley was removed.  She initially had some voiding issues with decreased sensation to void. We tested a post void residual in May, 2016 and this revealed adequate voiding.  On 12/30/14 she was admitted to [facility] with sepsis associated with Enterobacter Cloacae UTI. This was treated with IV antibiotics and then a prolonged course of oral cipro. Imaging performed at the time of admission (a CT of the abdo/pelvis) revealed: Bilateral double-J internal ureteral stents in adequate position with persistent mild bilateral hydronephrosis    She complete radiation therapy from 01/29/15 to 03/10/15 with 50Gy of external beam radiation and IMRT. She tolerated therapy well with minor skin irritation.  She had her ureteral stents removed in June, 2016, however, then developed right ureteral obstruction, hydroureter and pain. She went to the OR with Dr Tresa Moore on 03/20/15 for ureteral dilation and right stent placement (the left was draining well). No tumor was seen on cysto.   Right ureteral stent now out. No vaginal bleeding. Stoma working well. Gaining weight. Mood improved.   CT abdo/pelvis on 05/14/15 showed: no new lesions, stable thickening in right pelvis/distal right ureter consistent with radiation effect.  She has had bronchitis symptoms for >48month s/p levaquin trial.  The patient's CT in January 2017 showed progression of her right ureteral obstruction after stent removal. Dr MTresa Mooretook her to the OR on 09/05/15 for a cystoscopy, retrograde pyelogram and right ureteral stent placement.   The CT on 08/18/15 showed decreased attenuation of the right renal parenchyma, right hydronephrosis that was severe no excretion of contrast on that for graphic phase imaging. The perivascular soft tissue thickening in the pelvis and presacral regions grossly stable consistent with radiation changes.  The patient was diagnosed with ER/PR positive left breast cancer in April 2017. Surgery is scheduled for May. She is having a bilateral mastectomy with expander reconstruction.  Interval Hx:  Repeat CT imaging on 02/08/16 revealed : Stable post treatment changes in presacral region. No evidence of recurrent or metastatic carcinoma within the abdomen or pelvis. Interval placement of right ureteral stent in appropriate position. Moderate right hydroureteronephrosis remains stable. Stable benign left adrenal adenoma and hepatic steatosis.  On 03/10/16 she had replacement of her right ureteral stent  with Dr Tresa Moore.  She has a planned plastic  surgery/reconstruction in September, 2017.  She denies vaginal bleeding. Feels pelvic heaviness with standing for a long time and discomfort with prolonged sitting.  Review of systems: Constitutional:  She has no weight gain or weight loss. She has a low grade fever no chills. Eyes: No blurred vision Ears, Nose, Mouth, Throat: No dizziness, headaches or changes in hearing. No mouth sores. Cardiovascular: No chest pain, palpitations or edema. Respiratory:  No shortness of breath, wheezing  Gastrointestinal: She has normal bowel movements trhough stoma without diarrhea or constipation. She denies any nausea or vomiting. She denies blood in her stool or heart burn. Genitourinary:  See HPI Musculoskeletal: Denies muscle weakness or joint pains.  Skin:  She has no skin changes, rashes or itching Neurological:  Denies dizziness or headaches. No neuropathy, no numbness or tingling. Psychiatric:  She denies depression or anxiety. Hematologic/Lymphatic:   No easy bruising or bleeding  Allergies  Allergen Reactions  . Penicillins Swelling    Facial swelling Has patient had a PCN reaction causing immediate rash, facial/tongue/throat swelling, SOB or lightheadedness with hypotension: Yes Has patient had a PCN reaction causing severe rash involving mucus membranes or skin necrosis: Yes Has patient had a PCN reaction that required hospitalization No Has patient had a PCN reaction occurring within the last 10 years: No If all of the above answers are "NO", then may proceed with Cephalosporin use.   [name] Kitchen Ultram [Tramadol] Hives  . Adhesive [Tape]     blisters  . Cefaclor Rash    Ceclor  . Erythromycin     Gastritis, abd cramps  . Trimethoprim Rash  . Pectin Rash    Pectin ring for stoma  . Sulfa Antibiotics Rash    Current Outpatient Prescriptions on File Prior to Visit  Medication Sig Dispense Refill  . anastrozole (ARIMIDEX) 1 MG tablet Take 1 tablet (1 mg total) by mouth daily. (Patient  taking differently: Take 1 mg by mouth every morning. ) 90 tablet 3  . Biotin 5 MG TABS Take 5 mg by mouth every morning.     . Canagliflozin (INVOKANA) 300 MG TABS Take 300 mg by mouth every morning.     . Cholecalciferol (VITAMIN D3) 10000 UNITS capsule Take 10,000 Units by mouth once a week. Sunday evening's    . diphenhydrAMINE (BENADRYL) 25 MG tablet Take 25 mg by mouth every 4 (four) hours as needed for itching, allergies or sleep. Reported on 09/15/2015    . filgrastim (NEUPOGEN) 480 MCG/1.6ML injection Inject 1.6 ml under the skin every 3 days for life 16 mL 11  . hyaluronate sodium (RADIAPLEXRX) GEL Apply 1 application topically 2 (two) times daily as needed (scars). Reported on 09/15/2015    . levofloxacin (LEVAQUIN) 500 MG tablet Take 1 tablet (500 mg total) by mouth daily. 10 tablet 0  . levothyroxine (SYNTHROID) 175 MCG tablet Take 87.5-175 mcg by mouth daily before breakfast. 167mg daily except 87.548m on Sundays.    . [name] Kitchenoratadine (CLARITIN) 10 MG tablet Take 10 mg by mouth every morning.     . metFORMIN (GLUCOPHAGE) 1000 MG tablet Take 1,000 mg by mouth 2 (two) times daily with a meal.     . methocarbamol (ROBAXIN) 500 MG tablet Take 1 tablet (500 mg total) by mouth every 8 (eight) hours as needed for muscle spasms. 30 tablet 0  . Multiple Vitamin (MULTIVITAMIN WITH MINERALS) TABS tablet Take 1 tablet by mouth daily.    . [name] Kitchenmega-3  acid ethyl esters (LOVAZA) 1 G capsule Take 1 g by mouth 2 (two) times daily.    [name] Kitchen omeprazole (PRILOSEC) 20 MG capsule Take 20 mg by mouth at bedtime. Other other night    . oxyCODONE (OXY IR/ROXICODONE) 5 MG immediate release tablet Take 1-2 tablets (5-10 mg total) by mouth every 4 (four) hours as needed for moderate pain (post-operatively). (Patient not taking: Reported on 03/11/2016) 10 tablet 0  . Polyethyl Glycol-Propyl Glycol (SYSTANE OP) Place 1 drop into both eyes at bedtime.     . polyethylene glycol (MIRALAX / GLYCOLAX) packet Take 17 g by mouth daily  as needed. Reported on 01/19/2016    . rosuvastatin (CRESTOR) 10 MG tablet Take 10 mg by mouth every evening.     . sitaGLIPtin (JANUVIA) 100 MG tablet Take 100 mg by mouth every morning.     . valsartan (DIOVAN) 80 MG tablet Take 1 tablet (80 mg total) by mouth daily. (Patient taking differently: Take 80 mg by mouth every morning. )     No current facility-administered medications on file prior to visit.     Past Medical History:  Diagnosis Date  . Anemia in neoplastic disease   . Benign essential HTN   . 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 Cancer of corpus uteri, except isthmus [facility]) dx 10-15-2004 oncologist-- dr Denman [name] and dr Alvy Bimler     dx endometrial and ovarian cancer s/p  chemotheapy and surgery:  recurrent 11-19-2014 w/ radiation 01-29-2015 to 03-10-2015  . Chronic idiopathic neutropenia (HCC)    presumed related to chemotherapy March 2006--- followed by dr Alvy Bimler   . Complication of anesthesia    PONV  . Diabetic retinopathy, background ([place])   . DM type 2 (diabetes mellitus, type 2) ([place])    monitored by dr Legrand Como altheimer  . Dysuria   . GERD (gastroesophageal reflux disease)    takes omeprazole  . Hiatal hernia   . History of bronchitis   . History of gastric polyp    2014  duodenum  . History of radiation therapy    01-29-2015 to 03-10-2015  pelvis 50.4Gy  . Hydronephrosis of right kidney    urologist-  dr Tresa Moore  . Hypothyroidism    monitored by dr Legrand Como altheimer  . Mixed dyslipidemia   . Multiple thyroid nodules    Managed by Dr. Harlow Asa  . PONV (postoperative nausea and vomiting)    "scopolamine patch works for me"  . Radiation-induced dermatitis    contact dermatitis , radiation completed, rash only on ankles now.  . Right flank pain   . S/P colostomy ([place])   .  Seasonal allergies   . Shoulder stiffness    bilateral   . Vitamin D deficiency   . Wears glasses     Past Surgical History:  Procedure Laterality Date  . APPENDECTOMY    . BREAST RECONSTRUCTION WITH PLACEMENT OF TISSUE EXPANDER AND FLEX HD (ACELLULAR HYDRATED DERMIS) Bilateral 12/11/2015   Procedure: BILATERAL BREAST RECONSTRUCTION WITH PLACEMENT OF TISSUE EXPANDERS;  Surgeon: Irene Limbo, MD;  Location: [place];  Service: Plastics;  Laterality: Bilateral;  . COLONOSCOPY WITH PROPOFOL N/A 08/21/2013   Procedure: COLONOSCOPY WITH PROPOFOL;  Surgeon: Cleotis Nipper, MD;  Location: [facility];  Service: Endoscopy;  Laterality: N/A;  . COLOSTOMY TAKEDOWN N/A 12/04/2014  Procedure: LAPROSCOPIC LYSIS OF ADHESIONS, SPLENIC MOBILIZATION, RELOCATION OF COLOSTOMY, DEBRIDEMENT INITIAL COLOSTOMY SITE;  Surgeon: Michael [name], MD;  Location: [facility];  Service: General;  Laterality: N/A;  . CYSTOSCOPY W/ RETROGRADES Right 11/21/2015   Procedure: CYSTOSCOPY WITH RETROGRADE PYELOGRAM;  Surgeon: Alexis Frock, MD;  Location: [facility];  Service: Urology;  Laterality: Right;  . CYSTOSCOPY W/ URETERAL STENT PLACEMENT Right 11/21/2015   Procedure: CYSTOSCOPY WITH STENT REPLACEMENT;  Surgeon: Alexis Frock, MD;  Location: [facility];  Service: Urology;  Laterality: Right;  . CYSTOSCOPY W/ URETERAL STENT PLACEMENT Right 03/10/2016   Procedure: CYSTOSCOPY WITH STENT REPLACEMENT;  Surgeon: Alexis Frock, MD;  Location: [facility];  Service: Urology;  Laterality: Right;  . CYSTOSCOPY WITH RETROGRADE PYELOGRAM, URETEROSCOPY AND STENT PLACEMENT Right 03/20/2015   Procedure: CYSTOSCOPY WITH RETROGRADE PYELOGRAM, URETEROSCOPY WITH BALLOON DILATION AND STENT PLACEMENT ON RIGHT;  Surgeon: Alexis Frock, MD;  Location: [facility];  Service: Urology;  Laterality: Right;  . CYSTOSCOPY WITH RETROGRADE PYELOGRAM, URETEROSCOPY AND STENT PLACEMENT Right 05/02/2015   Procedure: CYSTOSCOPY WITH RIGHT  RETROGRADE PYELOGRAM,  DIAGNOSTIC URETEROSCOPY AND STENT PULL ;  Surgeon: Alexis Frock, MD;  Location: [facility];  Service: Urology;  Laterality: Right;  . CYSTOSCOPY WITH RETROGRADE PYELOGRAM, URETEROSCOPY AND STENT PLACEMENT Right 09/05/2015   Procedure: CYSTOSCOPY WITH RETROGRADE PYELOGRAM,  AND STENT PLACEMENT;  Surgeon: Alexis Frock, MD;  [facility];  Service: Urology;  Laterality: Right;  . EUS N/A 10/02/2014   Procedure: LOWER ENDOSCOPIC ULTRASOUND (EUS);  Surgeon: Arta Silence, MD;  Location: [facility];  Service: Endoscopy;  Laterality: N/A;  . EXCISION SOFT TISSUE MASS RIGHT FOREMAN  12-08-2006  . EYE SURGERY  as child   pytosis of eyelids repair  . INCISION AND DRAINAGE OF WOUND Bilateral 12/26/2015   Procedure: DEBRIDEMENT OF BILATERAL MASTECTOMY FLAPS;  Surgeon: Irene Limbo, MD;  Location: [place];  Service: Plastics;  Laterality: Bilateral;  . LAPAROSCOPIC CHOLECYSTECTOMY  1990  . MASTECTOMY Right 12/11/2015   PROPHYLACTIC   . MASTECTOMY COMPLETE / SIMPLE W/ SENTINEL NODE BIOPSY Left 12/11/2015   AXILLARY SENTINEL LYMPH NODE BIOPSY   . MASTECTOMY W/ SENTINEL NODE BIOPSY Bilateral 12/11/2015   Procedure: RIGHT PROPHYLACTIC MASTECTOMY, LEFT TOTAL MASTECTOMY WITH LEFT AXILLARY SENTINEL LYMPH NODE BIOPSY;  Surgeon: Stark Klein, MD;  Location: [place];  Service: General;  Laterality: Bilateral;  . OSTOMY N/A 11/19/2014   Procedure: OSTOMY;  Surgeon: Michael [name], MD;  [facility];  Service: General;  Laterality: N/A;  . ROBOTIC ASSISTED LAP VAGINAL HYSTERECTOMY N/A 11/19/2014   Procedure: ROBOTIC LYSIS OF ADHESIONS, CONVERTED TO LAPAROTOMY RADICAL UPPER VAGINECTOMY,LOW ANTERIOR BOWEL RESECTION, COLOSTOMY, BILATERAL URETERAL STENT PLACEMENT AND CYSTONOMY CLOSURE;  Surgeon: Everitt Amber, MD;  Location: [facility];  Service: Gynecology;  Laterality: N/A;  . TISSUE EXPANDER FILLING Bilateral 12/26/2015   Procedure: EXPANSION OF BILATERAL CHEST  TISSUE EXPANDERS (60 mL- Right; 75 mL- Left);  Surgeon: Irene Limbo, MD;  Location: [place];  Service: Plastics;  Laterality: Bilateral;  . TONSILLECTOMY    . TOTAL ABDOMINAL HYSTERECTOMY  March 2006   [facility]   and Bilateral Salpingoophorectomy/  staging for Ovarian cancer/  an    Family History  Problem Relation Age of Onset  . Cancer Mother 17    stomach ca  . Hypertension Mother   . Cancer Father 10    prostate ca  . Diabetes Father   . Heart disease Father     CABG  .  Breast cancer Maternal Aunt     dx in her 67s  . Lymphoma Paternal Aunt   . Brain cancer Paternal Grandfather   . Ovarian cancer Other   . Diabetes Sister   . Hypertension Brother y-10  . Heart disease Brother     CABG  . Diabetes Brother     Social History   Social History  . Marital status: Married    Spouse name: N/A  . Number of children: 1  . Years of education: N/A   Occupational History  . retired Therapist, sports from [place] History Main Topics  . Smoking status: Never Smoker  . Smokeless tobacco: Never Used  . Alcohol use Yes     Comment: rare social  . Drug use: No  . Sexual activity: Not Currently   Other Topics Concern  . Not on file   Social History Narrative   Exercise-- has not gotten back into it since cancer came back   CBC    Component Value Date/Time   WBC 5.3 03/11/2016 1448   WBC 9.3 12/26/2015 1040   RBC 4.10 03/11/2016 1448   RBC 3.92 12/26/2015 1040   HGB 10.4 (L) 03/11/2016 1448   HCT 34.0 (L) 03/11/2016 1448   PLT 256 03/11/2016 1448   MCV 82.9 03/11/2016 1448   MCH 25.4 03/11/2016 1448   MCH 26.0 12/26/2015 1040   MCHC 30.6 (L) 03/11/2016 1448   MCHC 30.0 12/26/2015 1040   RDW 17.0 (H) 03/11/2016 1448   LYMPHSABS 1.1 03/11/2016 1448   MONOABS 0.7 03/11/2016 1448   EOSABS 0.1 03/11/2016 1448   BASOSABS 0.0 03/11/2016 1448     Physical Exam: There were no vitals taken for this visit. General: Well dressed, well nourished in no  apparent distress.   HEENT:  Normocephalic and atraumatic, no lesions.  Extraocular muscles intact. Sclerae anicteric. Pupils equal, round, reactive. No mouth sores or ulcers. Thyroid is normal size, not nodular, midline. Skin:  No lesions or rashes. Breasts:  deferred Lungs:  Clear to auscultation bilaterally.  No wheezes. Cardiovascular:  Regular rate and rhythm.  No murmurs or rubs. Abdomen:  Soft, nontender, nondistended.  No palpable masses.  No hepatosplenomegaly.  No ascites. Normal bowel sounds.  No hernias.  Incision is healed. Laparoscopic incision healed. Stomal site healed. Stomal appliance in situ. Genitourinary: vaginal cuff intact without blood or fluid in the vault. Improved length and elasticity. Normal mucosa.No palpable masses in pelvis. Rectal exam: stump in tact, no masses Extremities: No cyanosis, clubbing or edema.  No calf tenderness or erythema. No palpable cords. Psychiatric: Mood and affect are appropriate. Neurological: Awake, alert and oriented x 3. Sensation is intact, no neuropathy.  Musculoskeletal: No pain, normal strength and range of motion.  Donaciano Eva, MD  CC: Dr Tresa Moore, Dr Alvy Bimler, Dr Theressa Millard

## 2016-03-25 DIAGNOSIS — I972 Postmastectomy lymphedema syndrome: Secondary | ICD-10-CM | POA: Diagnosis not present

## 2016-03-26 DIAGNOSIS — I972 Postmastectomy lymphedema syndrome: Secondary | ICD-10-CM | POA: Diagnosis not present

## 2016-04-07 NOTE — H&P (Signed)
Subjective:    Patient ID: [name] is a 60 y.o. female.  HPI  3.5 months post op, course complicated by incisional mastectomy flap necrosis and now post debridement flaps. Since last visit had stent exchange right ureter. Last visit we discussed her concern that her abdomen is "leading the way" and hopes eventual implants will have more projection than abdomen. She has been doing a lot of internet reading and looking at pictures and notes she likes the appearance of round implants.  Presented following screening MMG with left breast distortion. Diagnostic MMG and US showed a 0.6 x 0.8 x 0.9 cm area of distortion at the 10 o'clock position of the left breast 5 cm from the nipple. No abnormal left axillary lymph nodes identified. Biopsy with IDC/DCIS, ER/PR +, her 2 -. 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 No plans for chemotherapy given her chronic neutropenia. Tolerating Arimidex. Seen by Dr. Alvy Bimler last week, had CT AP no evidence recurrence.  PMH significant for ovarian cancer post TAH/BSO and adjuvant chemotherapy in 2006. 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 On chronic neupogen. Diagnosed with recurrence 2.2016 with invasion bladder and underwent robotic-assisted lysis of adhesions, converted to laparotomy, radical upper vaginectomy and low anterior resection with colostomy. Bilateral ureteral stent placement and cystotomy repair. 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 Completed adjuvant radiation pelvis. 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 Tresa Moore. Reports she has not had frequent UTI but often has "mixed flora" on culture without true infection. Has supply of Levaquin to take incase UTI symptoms develop. Had area of perforation at ostomy with leakage stool abdominal  wall and developed sepsis and required revision ostomy.   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   Prior 44C cup, DD when at hightest wt. Does not desire to go any smaller. Right mastectomy 1024 g Left 1130 g  Review of Systems     Objective:   Physical Exam  Cardiovascular: Normal rate, regular rhythm and normal heart sounds.   Pulmonary/Chest: Effort normal and breath sounds normal.  Abdominal: Soft.  + ostomy     Chest incisions:  Chest soft,scars maturing all areas healed, redundant skin at medial edges bilateral scars Depression left axilla and minimal soft tissue over pectoralis origin BW R 14 L 14 cm  Assessment:     Left breast cancer s/p bilateral mastectomies, TE reconstruction S/p debridement mastectomy flaps History ovarian ca. Neutropenia chronic    Plan:   Plan second stage reconstruction with implant exchange. She has elected for Smooth Round Silicone. Counseled I will check implant company catalogues for greater projections vs base width as she does need wider implant and expander and choose few to have available, but likely will be in 700-800 ml size.  Cannot assure her final projection will be more than abdomen.   Given her multiple abdominal surgeries, I have recommend that we not do any fat harvest from abdomen. I will plan liposuction lateral chest to aid with contour and use this for fat grafting. Counseled this will not be a large amount of fat and will use preferentially left axilla and over muscle in this area. Reviewed variable nature of take, risk contour deformities or lax skin donor site, fat necrosis. Discussed today I am concerned skin over lateral chest is lax and we can use this area for fat harvest  but also plan direct excision of redundant soft tissue. She understands this will increase length of scar considerably to lateral chest wall.  She reports she has a "pharmacy" at home and no Rx needed post op. She has Levaquin supply and she will  plan to take this post op for 5-7 days. Plan OP surgery. She continues on her Neupogen.  Natrelle 133-MV-T 400 ml tissue expanders placed,  RIGHT fill volume 560 ml LEFT fill volume  590 ml total fill volume  Irene Limbo, MD [facility] Plastic & Reconstructive Surgery [phone], pin [phone]

## 2016-04-13 ENCOUNTER — Encounter (HOSPITAL_BASED_OUTPATIENT_CLINIC_OR_DEPARTMENT_OTHER): Payer: Self-pay | Admitting: *Deleted

## 2016-04-14 ENCOUNTER — Telehealth: Payer: Self-pay | Admitting: *Deleted

## 2016-04-14 ENCOUNTER — Encounter (HOSPITAL_BASED_OUTPATIENT_CLINIC_OR_DEPARTMENT_OTHER)
Admission: RE | Admit: 2016-04-14 | Discharge: 2016-04-14 | Disposition: A | Discharge status: Home / Self Care | Payer: BLUE CROSS/BLUE SHIELD | Source: Ambulatory Visit | Attending: Plastic Surgery | Admitting: Plastic Surgery

## 2016-04-14 DIAGNOSIS — Z6834 Body mass index (BMI) 34.0-34.9, adult: Secondary | ICD-10-CM | POA: Diagnosis not present

## 2016-04-14 DIAGNOSIS — E11319 Type 2 diabetes mellitus with unspecified diabetic retinopathy without macular edema: Secondary | ICD-10-CM | POA: Diagnosis not present

## 2016-04-14 DIAGNOSIS — E785 Hyperlipidemia, unspecified: Secondary | ICD-10-CM | POA: Diagnosis not present

## 2016-04-14 DIAGNOSIS — Z9013 Acquired absence of bilateral breasts and nipples: Secondary | ICD-10-CM | POA: Diagnosis not present

## 2016-04-14 DIAGNOSIS — E669 Obesity, unspecified: Secondary | ICD-10-CM | POA: Diagnosis not present

## 2016-04-14 DIAGNOSIS — D709 Neutropenia, unspecified: Secondary | ICD-10-CM | POA: Diagnosis not present

## 2016-04-14 DIAGNOSIS — Z923 Personal history of irradiation: Secondary | ICD-10-CM | POA: Diagnosis not present

## 2016-04-14 DIAGNOSIS — Z421 Encounter for breast reconstruction following mastectomy: Secondary | ICD-10-CM | POA: Diagnosis not present

## 2016-04-14 DIAGNOSIS — Z9221 Personal history of antineoplastic chemotherapy: Secondary | ICD-10-CM | POA: Diagnosis not present

## 2016-04-14 DIAGNOSIS — N1330 Unspecified hydronephrosis: Secondary | ICD-10-CM | POA: Diagnosis not present

## 2016-04-14 DIAGNOSIS — I10 Essential (primary) hypertension: Secondary | ICD-10-CM | POA: Diagnosis not present

## 2016-04-14 DIAGNOSIS — Z853 Personal history of malignant neoplasm of breast: Secondary | ICD-10-CM | POA: Diagnosis not present

## 2016-04-14 DIAGNOSIS — K219 Gastro-esophageal reflux disease without esophagitis: Secondary | ICD-10-CM | POA: Diagnosis not present

## 2016-04-14 DIAGNOSIS — Z8543 Personal history of malignant neoplasm of ovary: Secondary | ICD-10-CM | POA: Diagnosis not present

## 2016-04-14 DIAGNOSIS — E11649 Type 2 diabetes mellitus with hypoglycemia without coma: Secondary | ICD-10-CM | POA: Diagnosis not present

## 2016-04-14 LAB — BASIC METABOLIC PANEL
ANION GAP: 8 (ref 5–15)
BUN: 20 mg/dL (ref 6–20)
CHLORIDE: 102 mmol/L (ref 101–111)
CO2: 30 mmol/L (ref 22–32)
Calcium: 10.1 mg/dL (ref 8.9–10.3)
Creatinine, Ser: 1.02 mg/dL — ABNORMAL HIGH (ref 0.44–1.00)
GFR calc non Af Amer: 58 mL/min — ABNORMAL LOW (ref >60)
Glucose, Bld: 89 mg/dL (ref 65–99)
POTASSIUM: 3.8 mmol/L (ref 3.5–5.1)
SODIUM: 140 mmol/L (ref 135–145)

## 2016-04-14 LAB — CBC WITH DIFFERENTIAL/PLATELET
BASOS ABS: 0 10*3/uL (ref 0.0–0.1)
Basophils Relative: 1 %
EOS ABS: 0.1 10*3/uL (ref 0.0–0.7)
Eosinophils Relative: 2 %
HEMATOCRIT: 37.8 % (ref 36.0–46.0)
HEMOGLOBIN: 11.7 g/dL — AB (ref 12.0–15.0)
LYMPHS PCT: 17 %
Lymphs Abs: 0.5 10*3/uL — ABNORMAL LOW (ref 0.7–4.0)
MCH: 25.8 pg — ABNORMAL LOW (ref 26.0–34.0)
MCHC: 31 g/dL (ref 30.0–36.0)
MCV: 83.4 fL (ref 78.0–100.0)
MONOS PCT: 23 %
Monocytes Absolute: 0.7 10*3/uL (ref 0.1–1.0)
NEUTROS PCT: 57 %
Neutro Abs: 1.9 10*3/uL (ref 1.7–7.7)
Platelets: 274 10*3/uL (ref 150–400)
RBC: 4.53 MIL/uL (ref 3.87–5.11)
RDW: 17.2 % — ABNORMAL HIGH (ref 11.5–15.5)
WBC MORPHOLOGY: INCREASED
WBC: 3.2 10*3/uL — AB (ref 4.0–10.5)

## 2016-04-14 NOTE — Telephone Encounter (Signed)
-----   Message from Heath Lark, MD sent at 04/14/2016  3:46 PM EDT ----- Regarding: labs Her recent [place] is adequate. No need to change the neupogen dose or frequency. Please call her and let her know ----- Message ----- From: Irene Limbo, MD Sent: 04/14/2016   2:33 PM To: Heath Lark, MD  Hello-  This patient is having surgery on Friday for implant exchange. Her preop labs from today had WBC 3.2. I spoke with patient, took her Neupogen yesterday. She asked if you thought she needed additional dose this week- told her I would send you a message and call her back if you recommended anything.   Thanks [place]

## 2016-04-14 NOTE — Telephone Encounter (Signed)
LVM for pt informing her of Dr. Calton Dach message.  Asked her to please return nurse's call if any questions or concerns.

## 2016-04-16 ENCOUNTER — Encounter (HOSPITAL_BASED_OUTPATIENT_CLINIC_OR_DEPARTMENT_OTHER)
Admission: RE | Disposition: A | Discharge status: Home / Self Care | Payer: Self-pay | Source: Ambulatory Visit | Attending: Plastic Surgery

## 2016-04-16 ENCOUNTER — Ambulatory Visit (HOSPITAL_BASED_OUTPATIENT_CLINIC_OR_DEPARTMENT_OTHER): Payer: BLUE CROSS/BLUE SHIELD | Admitting: Anesthesiology

## 2016-04-16 ENCOUNTER — Ambulatory Visit (HOSPITAL_BASED_OUTPATIENT_CLINIC_OR_DEPARTMENT_OTHER)
Admission: RE | Admit: 2016-04-16 | Discharge: 2016-04-16 | Disposition: A | Discharge status: Home / Self Care | Payer: BLUE CROSS/BLUE SHIELD | Source: Ambulatory Visit | Attending: Plastic Surgery | Admitting: Plastic Surgery

## 2016-04-16 ENCOUNTER — Encounter (HOSPITAL_BASED_OUTPATIENT_CLINIC_OR_DEPARTMENT_OTHER): Payer: Self-pay | Admitting: Anesthesiology

## 2016-04-16 DIAGNOSIS — Z923 Personal history of irradiation: Secondary | ICD-10-CM | POA: Diagnosis not present

## 2016-04-16 DIAGNOSIS — Z9221 Personal history of antineoplastic chemotherapy: Secondary | ICD-10-CM | POA: Diagnosis not present

## 2016-04-16 DIAGNOSIS — Z421 Encounter for breast reconstruction following mastectomy: Secondary | ICD-10-CM | POA: Insufficient documentation

## 2016-04-16 DIAGNOSIS — D709 Neutropenia, unspecified: Secondary | ICD-10-CM | POA: Insufficient documentation

## 2016-04-16 DIAGNOSIS — Z853 Personal history of malignant neoplasm of breast: Secondary | ICD-10-CM | POA: Diagnosis not present

## 2016-04-16 DIAGNOSIS — Z8543 Personal history of malignant neoplasm of ovary: Secondary | ICD-10-CM | POA: Insufficient documentation

## 2016-04-16 DIAGNOSIS — Z6834 Body mass index (BMI) 34.0-34.9, adult: Secondary | ICD-10-CM | POA: Diagnosis not present

## 2016-04-16 DIAGNOSIS — E11319 Type 2 diabetes mellitus with unspecified diabetic retinopathy without macular edema: Secondary | ICD-10-CM | POA: Insufficient documentation

## 2016-04-16 DIAGNOSIS — E785 Hyperlipidemia, unspecified: Secondary | ICD-10-CM | POA: Diagnosis not present

## 2016-04-16 DIAGNOSIS — E669 Obesity, unspecified: Secondary | ICD-10-CM | POA: Insufficient documentation

## 2016-04-16 DIAGNOSIS — Z933 Colostomy status: Secondary | ICD-10-CM | POA: Diagnosis not present

## 2016-04-16 DIAGNOSIS — I129 Hypertensive chronic kidney disease with stage 1 through stage 4 chronic kidney disease, or unspecified chronic kidney disease: Secondary | ICD-10-CM | POA: Diagnosis not present

## 2016-04-16 DIAGNOSIS — K219 Gastro-esophageal reflux disease without esophagitis: Secondary | ICD-10-CM | POA: Insufficient documentation

## 2016-04-16 DIAGNOSIS — Z9013 Acquired absence of bilateral breasts and nipples: Secondary | ICD-10-CM | POA: Diagnosis not present

## 2016-04-16 DIAGNOSIS — N1330 Unspecified hydronephrosis: Secondary | ICD-10-CM | POA: Diagnosis not present

## 2016-04-16 DIAGNOSIS — I10 Essential (primary) hypertension: Secondary | ICD-10-CM | POA: Insufficient documentation

## 2016-04-16 DIAGNOSIS — E11649 Type 2 diabetes mellitus with hypoglycemia without coma: Secondary | ICD-10-CM | POA: Insufficient documentation

## 2016-04-16 HISTORY — PX: REMOVAL OF BILATERAL TISSUE EXPANDERS WITH PLACEMENT OF BILATERAL BREAST IMPLANTS: SHX6431

## 2016-04-16 HISTORY — PX: LIPOSUCTION WITH LIPOFILLING: SHX6436

## 2016-04-16 LAB — GLUCOSE, CAPILLARY
GLUCOSE-CAPILLARY: 155 mg/dL — AB (ref 65–99)
Glucose-Capillary: 111 mg/dL — ABNORMAL HIGH (ref 65–99)

## 2016-04-16 SURGERY — REMOVAL, TISSUE EXPANDER, BREAST, BILATERAL, WITH BILATERAL IMPLANT IMPLANT INSERTION
Anesthesia: General | Site: Breast | Laterality: Bilateral

## 2016-04-16 MED ORDER — PROPOFOL 10 MG/ML IV BOLUS
INTRAVENOUS | Status: DC | PRN
  Administered 2016-04-16: 180 mg via INTRAVENOUS

## 2016-04-16 MED ORDER — VANCOMYCIN HCL 1000 MG IV SOLR
INTRAVENOUS | Status: DC | PRN
  Administered 2016-04-16: 1000 mg via INTRAVENOUS

## 2016-04-16 MED ORDER — ONDANSETRON HCL 4 MG/2ML IJ SOLN
INTRAMUSCULAR | Status: AC
  Filled 2016-04-16: qty 2

## 2016-04-16 MED ORDER — VANCOMYCIN HCL IN DEXTROSE 1-5 GM/200ML-% IV SOLN
1000.0000 mg | INTRAVENOUS | Status: AC
  Administered 2016-04-16: 1000 mg via INTRAVENOUS

## 2016-04-16 MED ORDER — LEVOFLOXACIN 500 MG PO TABS: 500.0000 mg | ORAL_TABLET | Freq: Every day | ORAL | 0 refills | Status: DC

## 2016-04-16 MED ORDER — ROCURONIUM BROMIDE 10 MG/ML (PF) SYRINGE
PREFILLED_SYRINGE | INTRAVENOUS | Status: AC
  Filled 2016-04-16: qty 10

## 2016-04-16 MED ORDER — EPHEDRINE SULFATE 50 MG/ML IJ SOLN
INTRAMUSCULAR | Status: DC | PRN
  Administered 2016-04-16 (×2): 10 mg via INTRAVENOUS

## 2016-04-16 MED ORDER — FENTANYL CITRATE (PF) 100 MCG/2ML IJ SOLN
INTRAMUSCULAR | Status: AC
  Filled 2016-04-16: qty 2

## 2016-04-16 MED ORDER — FENTANYL CITRATE (PF) 100 MCG/2ML IJ SOLN
25.0000 ug | INTRAMUSCULAR | Status: DC | PRN
  Administered 2016-04-16: 25 ug via INTRAVENOUS
  Administered 2016-04-16: 50 ug via INTRAVENOUS
  Administered 2016-04-16: 25 ug via INTRAVENOUS

## 2016-04-16 MED ORDER — FENTANYL CITRATE (PF) 100 MCG/2ML IJ SOLN
50.0000 ug | INTRAMUSCULAR | Status: AC | PRN
  Administered 2016-04-16 (×2): 50 ug via INTRAVENOUS
  Administered 2016-04-16: 100 ug via INTRAVENOUS
  Administered 2016-04-16 (×2): 50 ug via INTRAVENOUS

## 2016-04-16 MED ORDER — LIDOCAINE HCL (CARDIAC) 20 MG/ML IV SOLN
INTRAVENOUS | Status: DC | PRN
  Administered 2016-04-16: 50 mg via INTRAVENOUS

## 2016-04-16 MED ORDER — DEXAMETHASONE SODIUM PHOSPHATE 10 MG/ML IJ SOLN
INTRAMUSCULAR | Status: AC
  Filled 2016-04-16: qty 1

## 2016-04-16 MED ORDER — CHLORHEXIDINE GLUCONATE CLOTH 2 % EX PADS: 6.0000 | MEDICATED_PAD | Freq: Once | CUTANEOUS | Status: DC

## 2016-04-16 MED ORDER — MIDAZOLAM HCL 2 MG/2ML IJ SOLN
1.0000 mg | INTRAMUSCULAR | Status: DC | PRN
  Administered 2016-04-16: 2 mg via INTRAVENOUS

## 2016-04-16 MED ORDER — VANCOMYCIN HCL IN DEXTROSE 1-5 GM/200ML-% IV SOLN
INTRAVENOUS | Status: AC
  Filled 2016-04-16: qty 200

## 2016-04-16 MED ORDER — ONDANSETRON HCL 4 MG/2ML IJ SOLN
INTRAMUSCULAR | Status: DC | PRN
  Administered 2016-04-16: 4 mg via INTRAVENOUS

## 2016-04-16 MED ORDER — ACETAMINOPHEN 500 MG PO TABS
ORAL_TABLET | ORAL | Status: AC
  Filled 2016-04-16: qty 2

## 2016-04-16 MED ORDER — DEXAMETHASONE SODIUM PHOSPHATE 4 MG/ML IJ SOLN
INTRAMUSCULAR | Status: DC | PRN
  Administered 2016-04-16: 10 mg via INTRAVENOUS

## 2016-04-16 MED ORDER — SUCCINYLCHOLINE CHLORIDE 200 MG/10ML IV SOSY
PREFILLED_SYRINGE | INTRAVENOUS | Status: AC
  Filled 2016-04-16: qty 10

## 2016-04-16 MED ORDER — PROMETHAZINE HCL 25 MG/ML IJ SOLN
6.2500 mg | INTRAMUSCULAR | Status: DC | PRN
Start: 2016-04-16 — End: 2016-04-16

## 2016-04-16 MED ORDER — MIDAZOLAM HCL 2 MG/2ML IJ SOLN
INTRAMUSCULAR | Status: AC
  Filled 2016-04-16: qty 2

## 2016-04-16 MED ORDER — GLYCOPYRROLATE 0.2 MG/ML IJ SOLN: 0.2000 mg | Freq: Once | INTRAMUSCULAR | Status: DC | PRN

## 2016-04-16 MED ORDER — SCOPOLAMINE 1 MG/3DAYS TD PT72
1.0000 | MEDICATED_PATCH | TRANSDERMAL | Status: DC
Start: 2016-04-16 — End: 2016-04-16
  Administered 2016-04-16: 1.5 mg via TRANSDERMAL

## 2016-04-16 MED ORDER — OXYCODONE HCL 5 MG PO TABS
5.0000 mg | ORAL_TABLET | Freq: Once | ORAL | Status: AC
  Administered 2016-04-16: 5 mg via ORAL

## 2016-04-16 MED ORDER — LIDOCAINE 2% (20 MG/ML) 5 ML SYRINGE
INTRAMUSCULAR | Status: AC
  Filled 2016-04-16: qty 5

## 2016-04-16 MED ORDER — SUCCINYLCHOLINE CHLORIDE 20 MG/ML IJ SOLN
INTRAMUSCULAR | Status: DC | PRN
  Administered 2016-04-16: 120 mg via INTRAVENOUS

## 2016-04-16 MED ORDER — ACETAMINOPHEN 500 MG PO TABS
1000.0000 mg | ORAL_TABLET | ORAL | Status: AC
  Administered 2016-04-16: 1000 mg via ORAL

## 2016-04-16 MED ORDER — OXYCODONE HCL 5 MG PO TABS
ORAL_TABLET | ORAL | Status: AC
  Filled 2016-04-16: qty 1

## 2016-04-16 MED ORDER — EPHEDRINE 5 MG/ML INJ
INTRAVENOUS | Status: AC
  Filled 2016-04-16: qty 10

## 2016-04-16 MED ORDER — SODIUM BICARBONATE 4 % IV SOLN
INTRAVENOUS | Status: DC | PRN
  Administered 2016-04-16: 100 mL via INTRAMUSCULAR

## 2016-04-16 MED ORDER — SUGAMMADEX SODIUM 200 MG/2ML IV SOLN
INTRAVENOUS | Status: DC | PRN
  Administered 2016-04-16: 180 mg via INTRAVENOUS

## 2016-04-16 MED ORDER — GABAPENTIN 300 MG PO CAPS
300.0000 mg | ORAL_CAPSULE | ORAL | Status: AC
  Administered 2016-04-16: 300 mg via ORAL

## 2016-04-16 MED ORDER — PHENYLEPHRINE 40 MCG/ML (10ML) SYRINGE FOR IV PUSH (FOR BLOOD PRESSURE SUPPORT)
PREFILLED_SYRINGE | INTRAVENOUS | Status: AC
Start: 2016-04-16 — End: 2016-04-16
  Filled 2016-04-16: qty 10

## 2016-04-16 MED ORDER — ARTIFICIAL TEARS OP OINT
TOPICAL_OINTMENT | OPHTHALMIC | Status: AC
  Filled 2016-04-16: qty 3.5

## 2016-04-16 MED ORDER — ROCURONIUM BROMIDE 100 MG/10ML IV SOLN
INTRAVENOUS | Status: DC | PRN
  Administered 2016-04-16: 20 mg via INTRAVENOUS
  Administered 2016-04-16: 30 mg via INTRAVENOUS

## 2016-04-16 MED ORDER — GABAPENTIN 300 MG PO CAPS
ORAL_CAPSULE | ORAL | Status: AC
  Filled 2016-04-16: qty 1

## 2016-04-16 MED ORDER — SCOPOLAMINE 1 MG/3DAYS TD PT72: 1.0000 | MEDICATED_PATCH | Freq: Once | TRANSDERMAL | Status: DC | PRN

## 2016-04-16 MED ORDER — SCOPOLAMINE 1 MG/3DAYS TD PT72
MEDICATED_PATCH | TRANSDERMAL | Status: AC
  Filled 2016-04-16: qty 1

## 2016-04-16 MED ORDER — LACTATED RINGERS IV SOLN
INTRAVENOUS | Status: DC
  Administered 2016-04-16 (×3): via INTRAVENOUS

## 2016-04-16 SURGICAL SUPPLY — 81 items
BAG DECANTER FOR FLEXI CONT (MISCELLANEOUS) ×2 IMPLANT
BINDER ABDOMINAL 10 UNV 27-48 (MISCELLANEOUS) IMPLANT
BINDER ABDOMINAL 12 SM 30-45 (SOFTGOODS) IMPLANT
BINDER BREAST 3XL (BIND) IMPLANT
BINDER BREAST LRG (GAUZE/BANDAGES/DRESSINGS) IMPLANT
BINDER BREAST MEDIUM (GAUZE/BANDAGES/DRESSINGS) IMPLANT
BINDER BREAST XLRG (GAUZE/BANDAGES/DRESSINGS) IMPLANT
BINDER BREAST XXLRG (GAUZE/BANDAGES/DRESSINGS) IMPLANT
BLADE SURG 10 STRL SS (BLADE) IMPLANT
BLADE SURG 11 STRL SS (BLADE) ×2 IMPLANT
BNDG GAUZE ELAST 4 BULKY (GAUZE/BANDAGES/DRESSINGS) ×4 IMPLANT
CANISTER LIPO FAT HARVEST (MISCELLANEOUS) ×2 IMPLANT
CANISTER SUCT 1200ML W/VALVE (MISCELLANEOUS) ×2 IMPLANT
CHLORAPREP W/TINT 26ML (MISCELLANEOUS) ×2 IMPLANT
COMPRESSION GARMENT LG MOREWEL (MISCELLANEOUS) IMPLANT
COMPRESSION GARMENT MD MOREWEL (MISCELLANEOUS) IMPLANT
COMPRESSION GARMENT XL MOREWEL (MISCELLANEOUS) IMPLANT
COVER MAYO STAND STRL (DRAPES) ×2 IMPLANT
DECANTER SPIKE VIAL GLASS SM (MISCELLANEOUS) IMPLANT
DRAIN CHANNEL 15F RND FF W/TCR (WOUND CARE) IMPLANT
DRAPE IMP U-DRAPE 54X76 (DRAPES) IMPLANT
DRSG PAD ABDOMINAL 8X10 ST (GAUZE/BANDAGES/DRESSINGS) ×4 IMPLANT
ELECT BLADE 4.0 EZ CLEAN MEGAD (MISCELLANEOUS) ×2
ELECT COATED BLADE 2.86 ST (ELECTRODE) ×2 IMPLANT
ELECT REM PT RETURN 9FT ADLT (ELECTROSURGICAL) ×2
ELECTRODE BLDE 4.0 EZ CLN MEGD (MISCELLANEOUS) ×1 IMPLANT
ELECTRODE REM PT RTRN 9FT ADLT (ELECTROSURGICAL) ×1 IMPLANT
EVACUATOR SILICONE 100CC (DRAIN) IMPLANT
FILTER LIPOSUCTION (MISCELLANEOUS) ×2 IMPLANT
GLOVE BIO SURGEON STRL SZ 6 (GLOVE) ×4 IMPLANT
GLOVE BIO SURGEON STRL SZ7.5 (GLOVE) ×2 IMPLANT
GLOVE BIOGEL PI IND STRL 7.5 (GLOVE) ×1 IMPLANT
GLOVE BIOGEL PI IND STRL 8 (GLOVE) ×1 IMPLANT
GLOVE BIOGEL PI INDICATOR 7.5 (GLOVE) ×1
GLOVE BIOGEL PI INDICATOR 8 (GLOVE) ×1
GOWN STRL REUS W/ TWL LRG LVL3 (GOWN DISPOSABLE) ×2 IMPLANT
GOWN STRL REUS W/TWL LRG LVL3 (GOWN DISPOSABLE) ×2
IMPLANT BREAST GEL 800CC (Breast) ×4 IMPLANT
IV NS 500ML (IV SOLUTION)
IV NS 500ML BAXH (IV SOLUTION) IMPLANT
KIT FILL SYSTEM UNIVERSAL (SET/KITS/TRAYS/PACK) IMPLANT
LINER CANISTER 1000CC FLEX (MISCELLANEOUS) ×2 IMPLANT
LIQUID BAND (GAUZE/BANDAGES/DRESSINGS) ×4 IMPLANT
NDL SAFETY ECLIPSE 18X1.5 (NEEDLE) ×1 IMPLANT
NEEDLE HYPO 18GX1.5 SHARP (NEEDLE) ×1
NEEDLE HYPO 25X1 1.5 SAFETY (NEEDLE) IMPLANT
NS IRRIG 1000ML POUR BTL (IV SOLUTION) ×2 IMPLANT
PACK BASIN DAY SURGERY FS (CUSTOM PROCEDURE TRAY) ×2 IMPLANT
PACK UNIVERSAL I (CUSTOM PROCEDURE TRAY) ×2 IMPLANT
PAD ALCOHOL SWAB (MISCELLANEOUS) ×2 IMPLANT
PENCIL BUTTON HOLSTER BLD 10FT (ELECTRODE) ×2 IMPLANT
PIN SAFETY STERILE (MISCELLANEOUS) ×2 IMPLANT
SHEET MEDIUM DRAPE 40X70 STRL (DRAPES) ×4 IMPLANT
SIZER BREAST REUSE 750CC (SIZER) ×1
SIZER BREAST REUSE 800CC (SIZER) ×2
SIZER BREAST REUSE XFP 700CC (SIZER) ×2
SIZER BRST REUSE 800CC (SIZER) ×1 IMPLANT
SIZER BRST REUSE P6.7X 750CC (SIZER) ×1 IMPLANT
SIZER BRST REUSE XFP 700CC (SIZER) ×1 IMPLANT
SLEEVE SCD COMPRESS KNEE MED (MISCELLANEOUS) ×2 IMPLANT
SPONGE LAP 18X18 X RAY DECT (DISPOSABLE) ×4 IMPLANT
STAPLER VISISTAT 35W (STAPLE) ×2 IMPLANT
SUT ETHILON 2 0 FS 18 (SUTURE) IMPLANT
SUT MNCRL AB 4-0 PS2 18 (SUTURE) ×8 IMPLANT
SUT PDS AB 2-0 CT2 27 (SUTURE) IMPLANT
SUT VIC AB 3-0 PS1 18 (SUTURE) ×2
SUT VIC AB 3-0 PS1 18XBRD (SUTURE) ×2 IMPLANT
SUT VIC AB 3-0 SH 27 (SUTURE) ×2
SUT VIC AB 3-0 SH 27X BRD (SUTURE) ×2 IMPLANT
SUT VICRYL 4-0 PS2 18IN ABS (SUTURE) ×4 IMPLANT
SYR 50ML LL SCALE MARK (SYRINGE) ×12 IMPLANT
SYR BULB IRRIGATION 50ML (SYRINGE) ×4 IMPLANT
SYR CONTROL 10ML LL (SYRINGE) IMPLANT
SYR TB 1ML LL NO SAFETY (SYRINGE) ×2 IMPLANT
SYRINGE 10CC LL (SYRINGE) ×6 IMPLANT
TOWEL OR 17X24 6PK STRL BLUE (TOWEL DISPOSABLE) ×4 IMPLANT
TUBE CONNECTING 20X1/4 (TUBING) ×2 IMPLANT
TUBING INFILTRATION IT-10001 (TUBING) ×2 IMPLANT
TUBING SET GRADUATE ASPIR 12FT (MISCELLANEOUS) ×2 IMPLANT
UNDERPAD 30X30 (UNDERPADS AND DIAPERS) ×4 IMPLANT
YANKAUER SUCT BULB TIP NO VENT (SUCTIONS) ×2 IMPLANT

## 2016-04-16 NOTE — Anesthesia Procedure Notes (Signed)
Procedure Name: Intubation Performed by: Melynda Ripple D Pre-anesthesia Checklist: Patient identified, Emergency Drugs available, Suction available and Patient being monitored Patient Re-evaluated:Patient Re-evaluated prior to inductionOxygen Delivery Method: Circle system utilized Preoxygenation: Pre-oxygenation with 100% oxygen Intubation Type: IV induction Ventilation: Mask ventilation without difficulty Laryngoscope Size: Mac and 3 Grade View: Grade I Tube type: Oral Number of attempts: 1 Airway Equipment and Method: Stylet and Oral airway Placement Confirmation: ETT inserted through vocal cords under direct vision,  positive ETCO2 and breath sounds checked- equal and bilateral Secured at: 23 cm Tube secured with: Tape Dental Injury: Teeth and Oropharynx as per pre-operative assessment

## 2016-04-16 NOTE — Anesthesia Postprocedure Evaluation (Signed)
Anesthesia Post Note  Patient: Financial planner  Procedure(s) Performed: Procedure(s) (LRB): REMOVAL OF BILATERAL TISSUE EXPANDERS WITH PLACEMENT OF BILATERAL BREAST IMPLANTS (Bilateral) LIPOSUCTION WITH LIPOFILLING TO BILATERAL CHEST (Bilateral)  Patient location during evaluation: PACU Anesthesia Type: General Level of consciousness: awake and alert, oriented and patient cooperative Pain management: pain level controlled Vital Signs Assessment: post-procedure vital signs reviewed and stable Respiratory status: spontaneous breathing, nonlabored ventilation and respiratory function stable Cardiovascular status: blood pressure returned to baseline and stable Postop Assessment: no signs of nausea or vomiting Anesthetic complications: no    Last Vitals:  Vitals:   04/16/16 1723 04/16/16 1726  BP:    Pulse: 79 79  Resp: 18 (!) 24  Temp:      Last Pain:  Vitals:   04/16/16 1719  TempSrc:   PainSc: 3                  Katerine Morua,E. Aeryn Medici

## 2016-04-16 NOTE — Interval H&P Note (Signed)
History and Physical Interval Note:  04/16/2016 11:51 AM  [name]  has presented today for surgery, with the diagnosis of HISTORY OF BREAST CANCER,ACQUIRED ABSENCE BREAST  The various methods of treatment have been discussed with the patient and family. After consideration of risks, benefits and other options for treatment, the patient has consented to  Procedure(s): REMOVAL OF BILATERAL TISSUE EXPANDERS WITH PLACEMENT OF BILATERAL BREAST IMPLANTS (Bilateral) LIPOSUCTION WITH LIPOFILLING TO BILATERAL CHEST (Bilateral) as a surgical intervention .  The patient's history has been reviewed, patient examined, no change in status, stable for surgery.  I have reviewed the patient's chart and labs.  Questions were answered to the patient's satisfaction.     [name]

## 2016-04-16 NOTE — Op Note (Signed)
Operative Note   DATE OF OPERATION: 9.15.17  LOCATION: [facility]-outpatient  SURGICAL DIVISION: Plastic Surgery  PREOPERATIVE DIAGNOSES:  1. Acquired absence breasts 2. History breast cancer  POSTOPERATIVE DIAGNOSES:  same  PROCEDURE:  1. Removal bilateral tissue expanders and placement silicone implants. 2. Lipofilling from lateral chest to bilateral breast reconstruction   SURGEON: Irene Limbo MD MBA  ASSISTANT: none  ANESTHESIA:  General.   EBL: 65 ml  COMPLICATIONS: None immediate.   INDICATIONS FOR PROCEDURE:  The patient, [name], is a 60 y.o. female born on [birth year], is here for second stage reconstruction following immediate expander reconstruction and skin sparing mastectomies.   FINDINGS: 65 ml fat infiltrated over left breast reconstruction, 60 ml over right. Natrelle Inspira Smooth Round Extra Projection Cohesive SCX-800 ml implants placed bilateral.  RIGHT SN [id number] LEFT [id number]  DESCRIPTION OF PROCEDURE:  The patient's operative site was marked with the patient in the preoperative area to mark sternal notch, anterior axillary lines, chest midline. Lateral chest wall soft tissue rolls marked for donor site for fat grafting and then excision. The patientwas taken to the operating room. SCDs were placed and IV antibiotics were given. The patient's operative site was prepped and draped in a sterile fashion. A time out was performed and all information was confirmed to be correct.Incision made in right mastectomy scar and carried to pectoralis muscle. Muscle divided in direction of muscle fibers. Expander removed. Extensive capsulotomies performed superiorly and medially and submuscular dissection completed. Additional scoring of capsule over lower poles completed. Sizer placed. I then directed attention to left chest. Incision made in prior mastectomy scar, pectoralis muscle divided and expander removed. Capsulotomies performed superiorly and medially  and additional submuscular dissection completed. Sizer placed and patient brought to sitting position. Inspira Smooth Round Extra Projection 800 implants chosen.   Stab incision made over bilateral lateral chest wall and tumescent fluid infiltrated in lateral chest wall soft tissue rolls,total 200 ml tumescent infiltrated. Power assisted liposuction performed. The fat was then washed and prepared by gravity for infiltration. Harvested fat was then infiltrated in subcutaneous and intramuscular planes over superior medial poles and axillae.   It had been discussed with the patient preoperatively that the lateral chest wall soft tissue rolls were inelastic with redundancy of skin and would not contour with liposuction alone, thus plan for excision. Following completion of fat harvest, the patient again brought to sitting position and lateral chest wall redundant skin tailor tacked for excision. She was returned to supine position and the redundant skin marked bilateral was excised. Closure completed with 3-0 vicryl in dermis and running 4-0 monocryl for skin closure.  At this time breast cavities irrigated with solution containing polymyxin and bacitracin, followed by Betadine. Implant placed in right breast cavity. Care taken to ensure proper orientation. Closure was completed with 3-0 vicryl for approximation of muscle and superficial fascia. 4-0 vicryl was placed in dermis and running 4-0 monocryl was used to close skin.Over left breast following implant placement, closure completed with running 3-0 vicryl to approximate muscle. 4-0 vicryl was placed in dermis and running 4-0 monocryl was used to close skin.  Tissue adhesive applied to incisions. Dry dressing and breast binder applied.   The patient was allowed to wake from anesthesia, extubated and taken to the recovery room in satisfactory condition.   SPECIMENS: none  DRAINS: none  Irene Limbo, MD [facility] Plastic & Reconstructive  Surgery [phone], pin [phone]

## 2016-04-16 NOTE — Transfer of Care (Signed)
Immediate Anesthesia Transfer of Care Note  Patient: [facility]  Procedure(s) Performed: Procedure(s): REMOVAL OF BILATERAL TISSUE EXPANDERS WITH PLACEMENT OF BILATERAL BREAST IMPLANTS (Bilateral) LIPOSUCTION WITH LIPOFILLING TO BILATERAL CHEST (Bilateral)  Patient Location: PACU  Anesthesia Type:General  Level of Consciousness: awake, alert  and oriented  Airway & Oxygen Therapy: Patient Spontanous Breathing and Patient connected to face mask oxygen  Post-op Assessment: Report given to RN and Post -op Vital signs reviewed and stable  Post vital signs: Reviewed and stable  Last Vitals:  Vitals:   04/16/16 1623 04/16/16 1624  BP: (!) 153/79   Pulse: 83 82  Resp:  12  Temp:      Last Pain:  Vitals:   04/16/16 0959  TempSrc: Oral         Complications: No apparent anesthesia complications

## 2016-04-16 NOTE — Anesthesia Preprocedure Evaluation (Addendum)
Anesthesia Evaluation  Patient identified by MRN, date of birth, ID band Patient awake    Reviewed: Allergy & Precautions, NPO status , Patient's Chart, lab work & pertinent test results  History of Anesthesia Complications (+) PONV and history of anesthetic complications  Airway Mallampati: II  TM Distance: >3 FB Neck ROM: Full    Dental  (+) Dental Advisory Given   Pulmonary neg pulmonary ROS,    Pulmonary exam normal breath sounds clear to auscultation       Cardiovascular hypertension, Pt. on medications (-) angina(-) Past MI, (-) Cardiac Stents and (-) CABG  Rhythm:Regular Rate:Normal  HLD   Neuro/Psych neg Seizures negative neurological ROS     GI/Hepatic Neg liver ROS, hiatal hernia, GERD  Medicated and Controlled,  Endo/Other  diabetes, Type 2, Oral Hypoglycemic AgentsHypothyroidism Multiple thyroid nodules  Renal/GU Renal disease (hydronephrosis of right kidney)     Musculoskeletal   Abdominal (+) + obese,   Peds  Hematology  (+) Blood dyscrasia, anemia , Chronic idiopathic neutropenia   Anesthesia Other Findings Diabetic retinopathy, h/o endometrial and ovarian cancer in 2006 s/p chemo and recurrency in 2016 s/p radiation, left breast cancer  Reproductive/Obstetrics                            Anesthesia Physical Anesthesia Plan  ASA: III  Anesthesia Plan: General   Post-op Pain Management:    Induction: Intravenous  Airway Management Planned: Oral ETT  Additional Equipment:   Intra-op Plan:   Post-operative Plan: Extubation in OR  Informed Consent: I have reviewed the patients History and Physical, chart, labs and discussed the procedure including the risks, benefits and alternatives for the proposed anesthesia with the patient or authorized representative who has indicated his/her understanding and acceptance.   Dental advisory given  Plan Discussed with:  CRNA  Anesthesia Plan Comments: (Risks of general anesthesia discussed including, but not limited to, sore throat, hoarse voice, chipped/damaged teeth, injury to vocal cords, nausea and vomiting, allergic reactions, lung infection, heart attack, stroke, and death. All questions answered. )       Anesthesia Quick Evaluation

## 2016-04-16 NOTE — Discharge Instructions (Signed)
°  Post Anesthesia Home Care Instructions ° °Activity: °Get plenty of rest for the remainder of the day. A responsible adult should stay with you for 24 hours following the procedure.  °For the next 24 hours, DO NOT: °-Drive a car °-Operate machinery °-Drink alcoholic beverages °-Take any medication unless instructed by your physician °-Make any legal decisions or sign important papers. ° °Meals: °Start with liquid foods such as gelatin or soup. Progress to regular foods as tolerated. Avoid greasy, spicy, heavy foods. If nausea and/or vomiting occur, drink only clear liquids until the nausea and/or vomiting subsides. Call your physician if vomiting continues. ° °Special Instructions/Symptoms: °Your throat may feel dry or sore from the anesthesia or the breathing tube placed in your throat during surgery. If this causes discomfort, gargle with warm salt water. The discomfort should disappear within 24 hours. ° °If you had a scopolamine patch placed behind your ear for the management of post- operative nausea and/or vomiting: ° °1. The medication in the patch is effective for 72 hours, after which it should be removed.  Wrap patch in a tissue and discard in the trash. Wash hands thoroughly with soap and water. °2. You may remove the patch earlier than 72 hours if you experience unpleasant side effects which may include dry mouth, dizziness or visual disturbances. °3. Avoid touching the patch. Wash your hands with soap and water after contact with the patch. °  ° °

## 2016-04-19 ENCOUNTER — Encounter (HOSPITAL_BASED_OUTPATIENT_CLINIC_OR_DEPARTMENT_OTHER): Payer: Self-pay | Admitting: Plastic Surgery

## 2016-04-19 DIAGNOSIS — C50912 Malignant neoplasm of unspecified site of left female breast: Secondary | ICD-10-CM | POA: Diagnosis not present

## 2016-04-19 DIAGNOSIS — Z9011 Acquired absence of right breast and nipple: Secondary | ICD-10-CM | POA: Diagnosis not present

## 2016-04-20 DIAGNOSIS — Z933 Colostomy status: Secondary | ICD-10-CM | POA: Diagnosis not present

## 2016-05-04 DIAGNOSIS — E038 Other specified hypothyroidism: Secondary | ICD-10-CM | POA: Diagnosis not present

## 2016-05-04 DIAGNOSIS — E119 Type 2 diabetes mellitus without complications: Secondary | ICD-10-CM | POA: Diagnosis not present

## 2016-05-04 DIAGNOSIS — E782 Mixed hyperlipidemia: Secondary | ICD-10-CM | POA: Diagnosis not present

## 2016-05-04 DIAGNOSIS — E559 Vitamin D deficiency, unspecified: Secondary | ICD-10-CM | POA: Diagnosis not present

## 2016-05-04 IMAGING — CT CT ABD-PELV W/ CM
2 of 5 series · 15 of 46 positions shown, 17 images · IV contrast (OMNIPAQUE)
Comparison: Multiple priors, most recently CT of the abdomen and
pelvis 03/03/2015.

CLINICAL DATA: 59-year-old female with history of endometrial
carcinoma diagnosed in 6008 with a recurrence in December 2014.
Chemotherapy completed in 6008, with recent radiation therapy
completed in March 2015. Status post total abdominal hysterectomy,
colonic resection with colonoscopy and appendectomy. Lower abdominal
pain and nausea.

EXAM:
CT ABDOMEN AND PELVIS WITH CONTRAST
TECHNIQUE: Multidetector CT imaging of the abdomen and pelvis was performed
using the standard protocol following bolus administration of
intravenous contrast.
CONTRAST:  100mL OMNIPAQUE IOHEXOL 300 MG/ML  SOLN

[Series 2: rtn a/p with · axial · 0.85mm/px · z∈[-446,-66]mm · 12 of 88 slices shown, 14 images]
[im 6/88  soft-tissue]
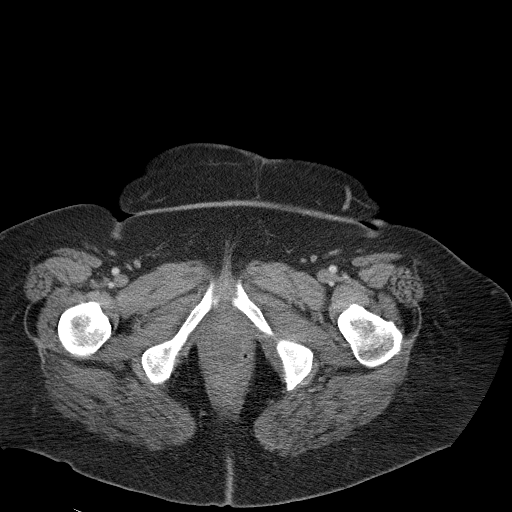
[im 6/88  bone]
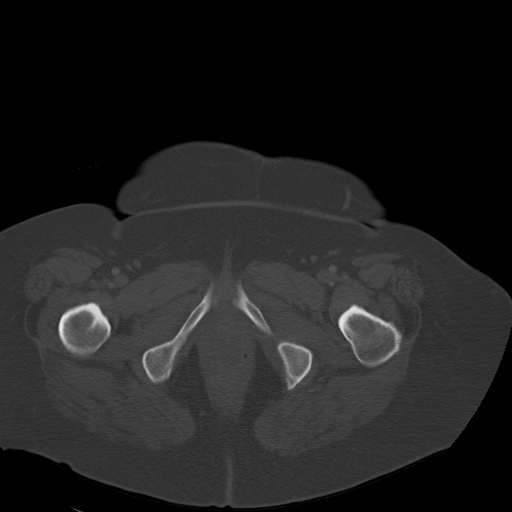
[im 16/88  soft-tissue]
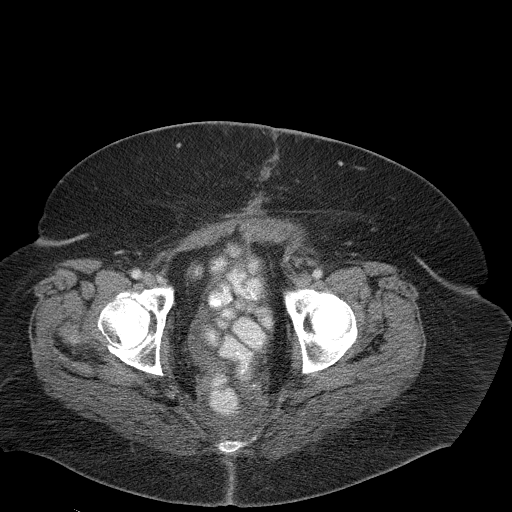
[im 21/88  soft-tissue]
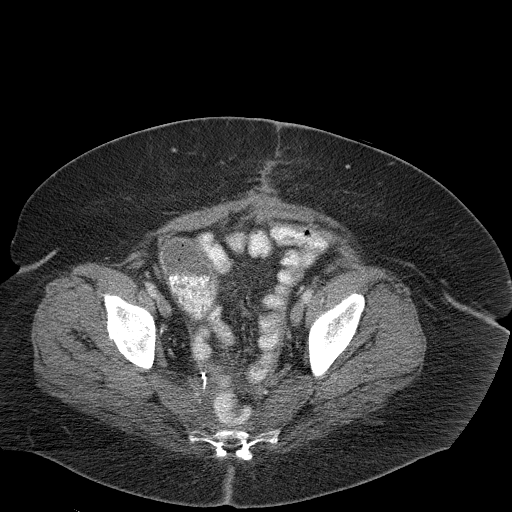
[im 26/88  soft-tissue]
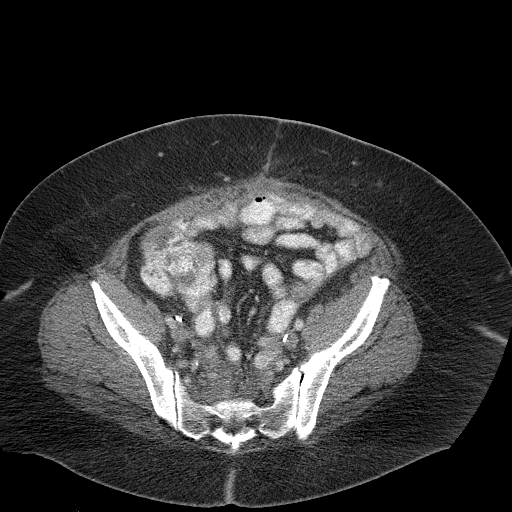
[im 36/88  soft-tissue]
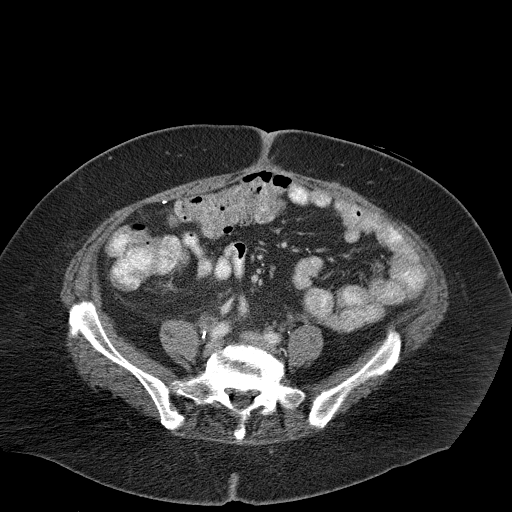
[im 41/88  soft-tissue]
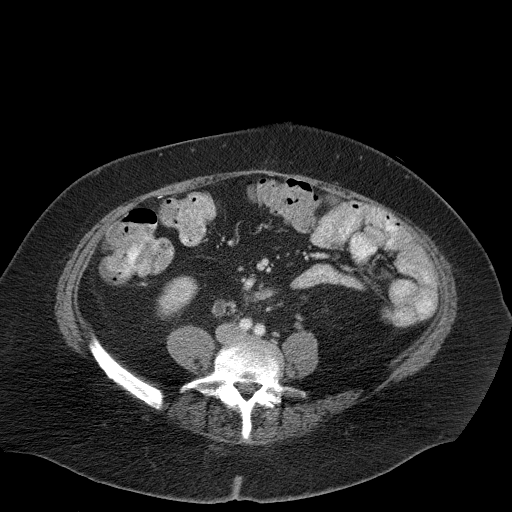
[im 47/88  soft-tissue]
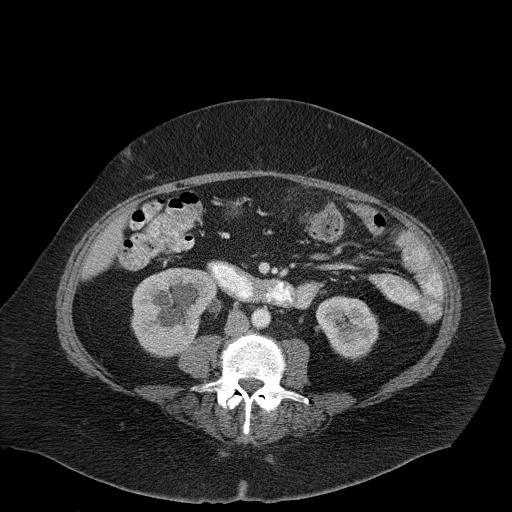
[im 57/88  soft-tissue]
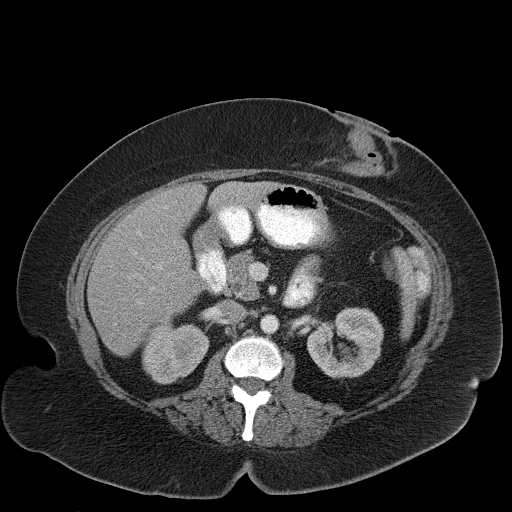
[im 62/88  soft-tissue]
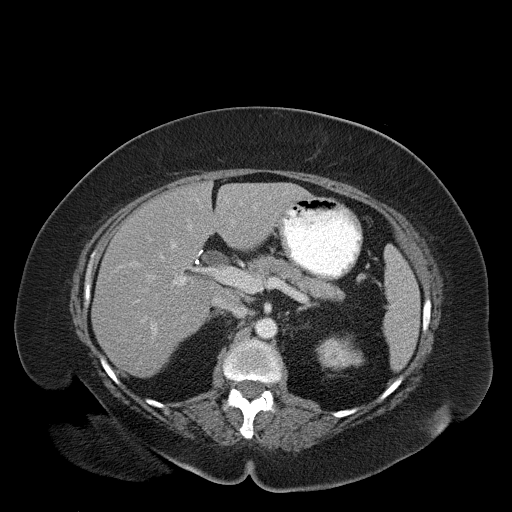
[im 62/88  bone]
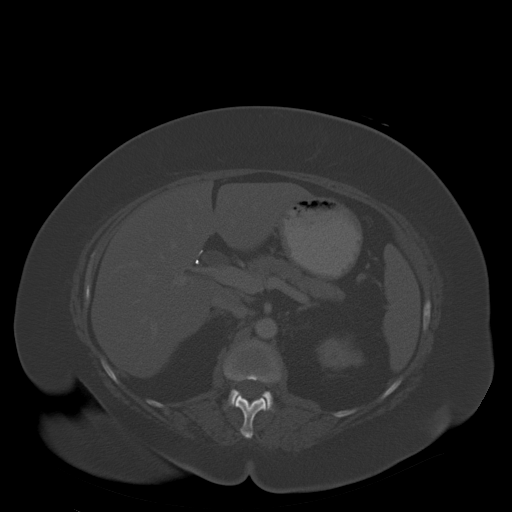
[im 67/88  soft-tissue]
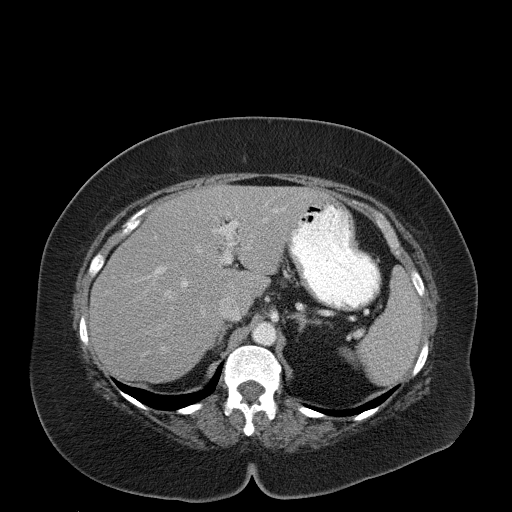
[im 77/88  soft-tissue]
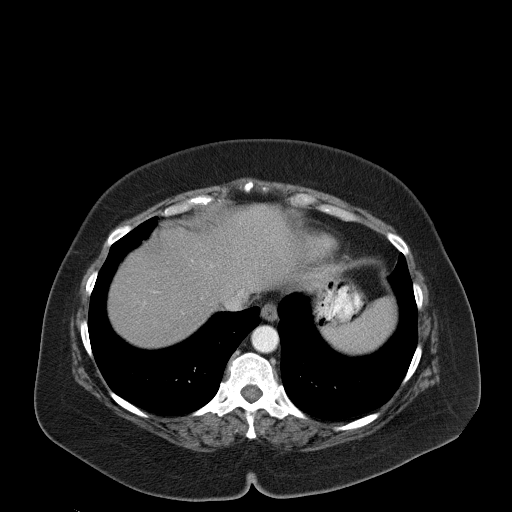
[im 82/88  soft-tissue]
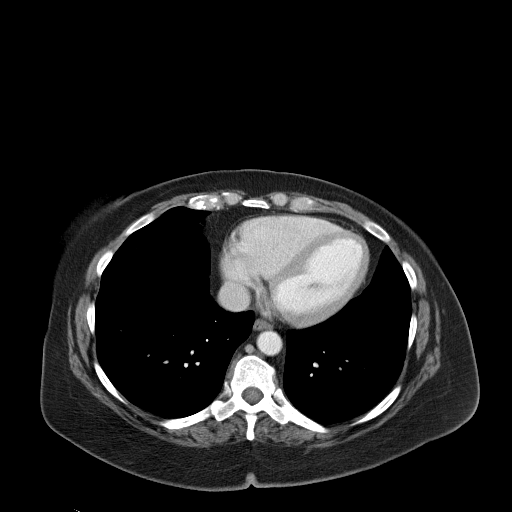

[Series 602: <mpr thick range> · coronal · 0.86mm/px · 3 of 108 slices shown]
[im 36/108  soft-tissue]
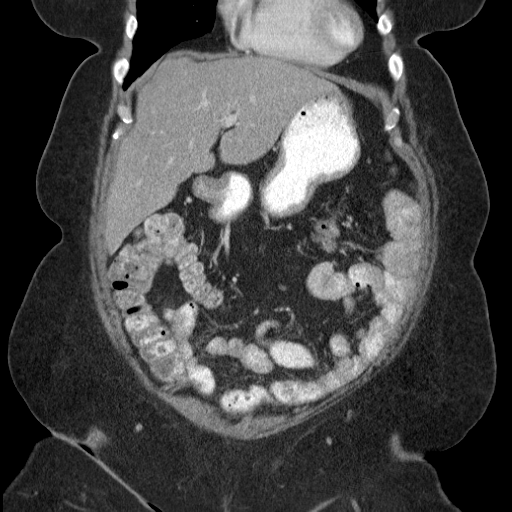
[im 48/108  soft-tissue]
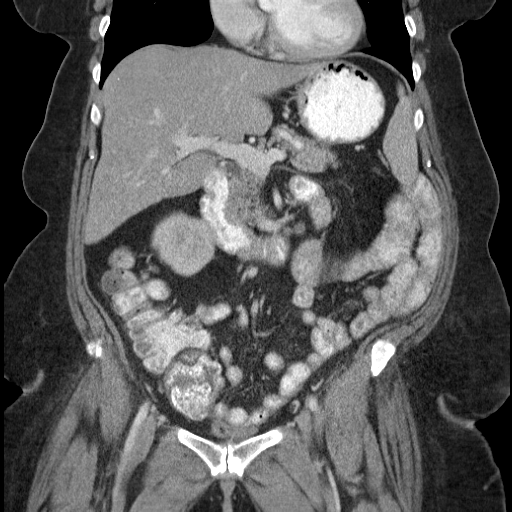
[im 60/108  soft-tissue]
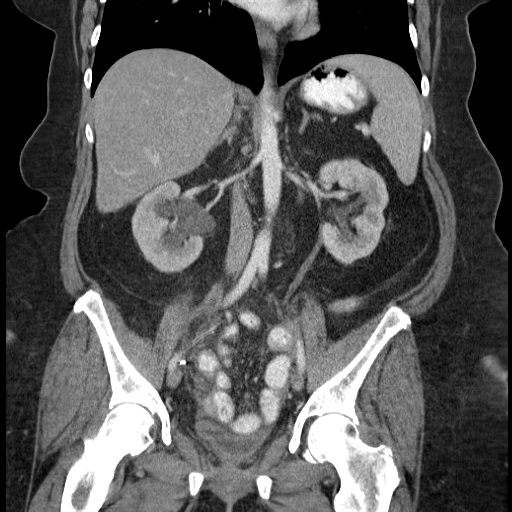

[15 of 46 positions shown; findings below may reference images not displayed]

FINDINGS: Lower chest:  Unremarkable.

Hepatobiliary: No cystic or solid hepatic lesions. No intra or
extrahepatic biliary ductal dilatation. Status post cholecystectomy.

Pancreas: No pancreatic mass. No pancreatic ductal dilatation. No
pancreatic or peripancreatic fluid or inflammatory changes.

Spleen: Unremarkable.

Adrenals/Urinary Tract: Bilateral adrenal glands and the left kidney
are normal in appearance. Mild to moderate right
hydroureteronephrosis, similar to the prior examination, presumably
related to extrinsic compression or traction on the distal third of
the right ureter in the area of prior treatment (amorphous presacral
soft tissue encases the distal third of the right ureter). There is
mild enhancement of the urothelium in the proximal right ureter. No
perinephric stranding around the right kidney at this time. Base of
the urinary bladder is low lying, and there is pelvic floor laxity,
indicative of a cystocele.

Stomach/Bowel: Normal appearance of the stomach. No pathologic
dilatation of small bowel or colon. Status post left hemicolectomy
with left upper quadrant colostomy.

Vascular/Lymphatic: No significant atherosclerotic disease, aneurysm
or dissection identified in the abdominal or pelvic vasculature. The
continues to be extensive amorphous soft tissue thickening in the
low anatomic pelvis extending from the vaginal cuff into the
presacral space, similar to the prior examination, favored to
predominantly reflect evolving postradiation changes and edema. No
discrete soft tissue mass is confidently identified on today's
examination to indicate residual tumor (although small deposits of
tumor in this region or enlarged lymph nodes are not excluded). No
other definite lymphadenopathy noted in the remaining portions of
the abdomen or pelvis. Numerous surgical clips throughout the
posterior anatomic pelvis from prior lymph node dissection.

Reproductive: Status post total abdominal hysterectomy and bilateral
salpingo oophorectomy.

Other: No significant volume of ascites.  No pneumoperitoneum.

Musculoskeletal: There are no aggressive appearing lytic or blastic
lesions noted in the visualized portions of the skeleton.
IMPRESSION: 1. Post treatment related changes in the low anatomic pelvis with
extensive amorphous soft tissue thickening extending from the
vaginal cuff into the presacral space, similar to the prior
examination, favored to be a combination of post surgical and post
radiation change. While residual disease is not excluded in these
regions, no definite site of residual/recurrent disease is
confidently identified on today's examination.
2. Persistent mild to moderate right-sided hydroureteronephrosis,
presumably related to narrowing of the distal third of the right
ureter from the above described process in the low anatomic pelvis
which appears to engulf the ureter.
3. Additional incidental findings, similar prior study, as above.

## 2016-05-06 DIAGNOSIS — E782 Mixed hyperlipidemia: Secondary | ICD-10-CM | POA: Diagnosis not present

## 2016-05-06 DIAGNOSIS — E038 Other specified hypothyroidism: Secondary | ICD-10-CM | POA: Diagnosis not present

## 2016-05-06 DIAGNOSIS — Z23 Encounter for immunization: Secondary | ICD-10-CM | POA: Diagnosis not present

## 2016-05-06 DIAGNOSIS — E119 Type 2 diabetes mellitus without complications: Secondary | ICD-10-CM | POA: Diagnosis not present

## 2016-05-06 DIAGNOSIS — I10 Essential (primary) hypertension: Secondary | ICD-10-CM | POA: Diagnosis not present

## 2016-05-18 ENCOUNTER — Telehealth: Payer: Self-pay | Admitting: *Deleted

## 2016-05-18 NOTE — Telephone Encounter (Signed)
Received fax from [facility]. Neupogen approved 04/13/16 through 11/09/16

## 2016-06-21 ENCOUNTER — Encounter (HOSPITAL_BASED_OUTPATIENT_CLINIC_OR_DEPARTMENT_OTHER): Payer: Self-pay | Admitting: *Deleted

## 2016-06-21 NOTE — Progress Notes (Addendum)
NPO AFTER MN.  ARRIVE AT 0800.  NEEDS ISTAT 8.  CURRENT EKG IN CHART AND EPIC.  WILL TAKE AM MEDS DOS W/ SIPS OF WATER EXCEPTION DIABETIC MEDS.  PT STATES REMEMBER SHE IS DIFFICULT IV STICK, VEINS ARE SMALLER CATH HELPS.  WHEN 22G USED SUCCESS ON FIRST ATTEMPT LAST VISIT AT [facility].

## 2016-06-22 ENCOUNTER — Other Ambulatory Visit: Payer: Self-pay | Admitting: Urology

## 2016-06-30 ENCOUNTER — Ambulatory Visit (HOSPITAL_BASED_OUTPATIENT_CLINIC_OR_DEPARTMENT_OTHER): Payer: BLUE CROSS/BLUE SHIELD | Admitting: Anesthesiology

## 2016-06-30 ENCOUNTER — Ambulatory Visit (HOSPITAL_BASED_OUTPATIENT_CLINIC_OR_DEPARTMENT_OTHER)
Admission: RE | Admit: 2016-06-30 | Discharge: 2016-06-30 | Disposition: A | Discharge status: Home / Self Care | Payer: BLUE CROSS/BLUE SHIELD | Source: Ambulatory Visit | Attending: Urology | Admitting: Urology

## 2016-06-30 ENCOUNTER — Encounter (HOSPITAL_BASED_OUTPATIENT_CLINIC_OR_DEPARTMENT_OTHER)
Admission: RE | Disposition: A | Discharge status: Home / Self Care | Payer: Self-pay | Source: Ambulatory Visit | Attending: Urology

## 2016-06-30 ENCOUNTER — Ambulatory Visit (HOSPITAL_COMMUNITY): Payer: BLUE CROSS/BLUE SHIELD

## 2016-06-30 ENCOUNTER — Encounter (HOSPITAL_BASED_OUTPATIENT_CLINIC_OR_DEPARTMENT_OTHER): Payer: Self-pay | Admitting: Anesthesiology

## 2016-06-30 DIAGNOSIS — Z79811 Long term (current) use of aromatase inhibitors: Secondary | ICD-10-CM | POA: Insufficient documentation

## 2016-06-30 DIAGNOSIS — Z7984 Long term (current) use of oral hypoglycemic drugs: Secondary | ICD-10-CM | POA: Insufficient documentation

## 2016-06-30 DIAGNOSIS — Z923 Personal history of irradiation: Secondary | ICD-10-CM | POA: Diagnosis not present

## 2016-06-30 DIAGNOSIS — N135 Crossing vessel and stricture of ureter without hydronephrosis: Secondary | ICD-10-CM | POA: Insufficient documentation

## 2016-06-30 DIAGNOSIS — Z79899 Other long term (current) drug therapy: Secondary | ICD-10-CM | POA: Diagnosis not present

## 2016-06-30 DIAGNOSIS — D630 Anemia in neoplastic disease: Secondary | ICD-10-CM | POA: Diagnosis not present

## 2016-06-30 DIAGNOSIS — I10 Essential (primary) hypertension: Secondary | ICD-10-CM | POA: Insufficient documentation

## 2016-06-30 DIAGNOSIS — E559 Vitamin D deficiency, unspecified: Secondary | ICD-10-CM | POA: Insufficient documentation

## 2016-06-30 DIAGNOSIS — K219 Gastro-esophageal reflux disease without esophagitis: Secondary | ICD-10-CM | POA: Diagnosis not present

## 2016-06-30 DIAGNOSIS — Z853 Personal history of malignant neoplasm of breast: Secondary | ICD-10-CM | POA: Diagnosis not present

## 2016-06-30 DIAGNOSIS — Z9013 Acquired absence of bilateral breasts and nipples: Secondary | ICD-10-CM | POA: Insufficient documentation

## 2016-06-30 DIAGNOSIS — E039 Hypothyroidism, unspecified: Secondary | ICD-10-CM | POA: Insufficient documentation

## 2016-06-30 DIAGNOSIS — Z8543 Personal history of malignant neoplasm of ovary: Secondary | ICD-10-CM | POA: Diagnosis not present

## 2016-06-30 DIAGNOSIS — E782 Mixed hyperlipidemia: Secondary | ICD-10-CM | POA: Insufficient documentation

## 2016-06-30 DIAGNOSIS — D709 Neutropenia, unspecified: Secondary | ICD-10-CM | POA: Insufficient documentation

## 2016-06-30 DIAGNOSIS — Z8542 Personal history of malignant neoplasm of other parts of uterus: Secondary | ICD-10-CM | POA: Diagnosis not present

## 2016-06-30 DIAGNOSIS — E119 Type 2 diabetes mellitus without complications: Secondary | ICD-10-CM | POA: Diagnosis not present

## 2016-06-30 DIAGNOSIS — Z466 Encounter for fitting and adjustment of urinary device: Secondary | ICD-10-CM | POA: Diagnosis not present

## 2016-06-30 DIAGNOSIS — Z6835 Body mass index (BMI) 35.0-35.9, adult: Secondary | ICD-10-CM | POA: Insufficient documentation

## 2016-06-30 DIAGNOSIS — Z960 Presence of urogenital implants: Secondary | ICD-10-CM | POA: Diagnosis not present

## 2016-06-30 DIAGNOSIS — E11319 Type 2 diabetes mellitus with unspecified diabetic retinopathy without macular edema: Secondary | ICD-10-CM | POA: Insufficient documentation

## 2016-06-30 HISTORY — PX: CYSTOSCOPY W/ URETERAL STENT PLACEMENT: SHX1429

## 2016-06-30 LAB — POCT I-STAT, CHEM 8
BUN: 21 mg/dL — AB (ref 6–20)
CHLORIDE: 101 mmol/L (ref 101–111)
Calcium, Ion: 1.28 mmol/L (ref 1.15–1.40)
Creatinine, Ser: 1.1 mg/dL — ABNORMAL HIGH (ref 0.44–1.00)
Glucose, Bld: 140 mg/dL — ABNORMAL HIGH (ref 65–99)
HEMATOCRIT: 33 % — AB (ref 36.0–46.0)
Hemoglobin: 11.2 g/dL — ABNORMAL LOW (ref 12.0–15.0)
POTASSIUM: 3.4 mmol/L — AB (ref 3.5–5.1)
SODIUM: 137 mmol/L (ref 135–145)
TCO2: 22 mmol/L (ref 0–100)

## 2016-06-30 LAB — GLUCOSE, CAPILLARY: GLUCOSE-CAPILLARY: 160 mg/dL — AB (ref 65–99)

## 2016-06-30 SURGERY — CYSTOSCOPY, WITH RETROGRADE PYELOGRAM AND URETERAL STENT INSERTION
Anesthesia: General | Site: Ureter | Laterality: Right

## 2016-06-30 MED ORDER — IOHEXOL 300 MG/ML  SOLN
INTRAMUSCULAR | Status: DC | PRN
  Administered 2016-06-30: 10 mL

## 2016-06-30 MED ORDER — DEXAMETHASONE SODIUM PHOSPHATE 4 MG/ML IJ SOLN
INTRAMUSCULAR | Status: DC | PRN
  Administered 2016-06-30: 10 mg via INTRAVENOUS

## 2016-06-30 MED ORDER — OXYCODONE HCL 5 MG PO TABS: 5.0000 mg | ORAL_TABLET | ORAL | 0 refills | Status: DC | PRN

## 2016-06-30 MED ORDER — ONDANSETRON HCL 4 MG/2ML IJ SOLN
INTRAMUSCULAR | Status: AC
  Filled 2016-06-30: qty 2

## 2016-06-30 MED ORDER — SCOPOLAMINE 1 MG/3DAYS TD PT72
MEDICATED_PATCH | TRANSDERMAL | Status: AC
  Filled 2016-06-30: qty 1

## 2016-06-30 MED ORDER — HYDROMORPHONE HCL 1 MG/ML IJ SOLN
0.2500 mg | INTRAMUSCULAR | Status: DC | PRN
  Filled 2016-06-30: qty 0.5

## 2016-06-30 MED ORDER — PROPOFOL 10 MG/ML IV BOLUS
INTRAVENOUS | Status: DC | PRN
  Administered 2016-06-30: 160 mg via INTRAVENOUS

## 2016-06-30 MED ORDER — PROPOFOL 10 MG/ML IV BOLUS
INTRAVENOUS | Status: AC
  Filled 2016-06-30: qty 20

## 2016-06-30 MED ORDER — MIDAZOLAM HCL 2 MG/2ML IJ SOLN
INTRAMUSCULAR | Status: AC
  Filled 2016-06-30: qty 2

## 2016-06-30 MED ORDER — LIDOCAINE 2% (20 MG/ML) 5 ML SYRINGE
INTRAMUSCULAR | Status: DC | PRN
  Administered 2016-06-30: 60 mg via INTRAVENOUS

## 2016-06-30 MED ORDER — DEXAMETHASONE SODIUM PHOSPHATE 10 MG/ML IJ SOLN
INTRAMUSCULAR | Status: AC
  Filled 2016-06-30: qty 1

## 2016-06-30 MED ORDER — GENTAMICIN IN SALINE 1.6-0.9 MG/ML-% IV SOLN
80.0000 mg | INTRAVENOUS | Status: DC
  Administered 2016-06-30: 340 mg via INTRAVENOUS
  Filled 2016-06-30: qty 50

## 2016-06-30 MED ORDER — ONDANSETRON HCL 4 MG/2ML IJ SOLN
INTRAMUSCULAR | Status: DC | PRN
  Administered 2016-06-30: 4 mg via INTRAVENOUS

## 2016-06-30 MED ORDER — PROMETHAZINE HCL 25 MG/ML IJ SOLN
6.2500 mg | INTRAMUSCULAR | Status: DC | PRN
  Filled 2016-06-30: qty 1

## 2016-06-30 MED ORDER — SCOPOLAMINE 1 MG/3DAYS TD PT72
1.0000 | MEDICATED_PATCH | TRANSDERMAL | Status: DC
  Administered 2016-06-30: 1.5 mg via TRANSDERMAL
  Filled 2016-06-30: qty 1

## 2016-06-30 MED ORDER — MIDAZOLAM HCL 5 MG/5ML IJ SOLN
INTRAMUSCULAR | Status: DC | PRN
  Administered 2016-06-30: 2 mg via INTRAVENOUS

## 2016-06-30 MED ORDER — LACTATED RINGERS IV SOLN
INTRAVENOUS | Status: DC
  Administered 2016-06-30: 09:00:00 via INTRAVENOUS
  Filled 2016-06-30: qty 1000

## 2016-06-30 MED ORDER — SODIUM CHLORIDE 0.9 % IR SOLN
Status: DC | PRN
  Administered 2016-06-30: 4000 mL

## 2016-06-30 MED ORDER — FENTANYL CITRATE (PF) 100 MCG/2ML IJ SOLN
INTRAMUSCULAR | Status: DC | PRN
  Administered 2016-06-30: 25 ug via INTRAVENOUS

## 2016-06-30 MED ORDER — FENTANYL CITRATE (PF) 100 MCG/2ML IJ SOLN
INTRAMUSCULAR | Status: AC
Start: 2016-06-30 — End: 2016-06-30
  Filled 2016-06-30: qty 2

## 2016-06-30 MED ORDER — GENTAMICIN SULFATE 40 MG/ML IJ SOLN
5.0000 mg/kg | Freq: Once | INTRAVENOUS | Status: DC
  Filled 2016-06-30: qty 8.5

## 2016-06-30 MED ORDER — MEPERIDINE HCL 25 MG/ML IJ SOLN
6.2500 mg | INTRAMUSCULAR | Status: DC | PRN
  Filled 2016-06-30: qty 1

## 2016-06-30 MED ORDER — LACTATED RINGERS IV SOLN
INTRAVENOUS | Status: DC
  Filled 2016-06-30: qty 1000

## 2016-06-30 SURGICAL SUPPLY — 27 items
BAG DRAIN URO-CYSTO SKYTR STRL (DRAIN) ×2 IMPLANT
BASKET DAKOTA 1.9FR 11X120 (BASKET) ×2 IMPLANT
BASKET LASER NITINOL 1.9FR (BASKET) IMPLANT
BASKET ZERO TIP NITINOL 2.4FR (BASKET) IMPLANT
CATH INTERMIT  6FR 70CM (CATHETERS) ×2 IMPLANT
CLOTH BEACON ORANGE TIMEOUT ST (SAFETY) ×2 IMPLANT
FIBER LASER FLEXIVA 365 (UROLOGICAL SUPPLIES) ×2 IMPLANT
FIBER LASER TRAC TIP (UROLOGICAL SUPPLIES) ×2 IMPLANT
GLOVE BIO SURGEON STRL SZ7.5 (GLOVE) ×2 IMPLANT
GLOVE ECLIPSE 7.0 STRL STRAW (GLOVE) ×2 IMPLANT
GLOVE INDICATOR 7.0 STRL GRN (GLOVE) ×2 IMPLANT
GOWN STRL REUS W/ TWL LRG LVL3 (GOWN DISPOSABLE) ×2 IMPLANT
GOWN STRL REUS W/TWL LRG LVL3 (GOWN DISPOSABLE) ×2
GOWN STRL REUS W/TWL XL LVL3 (GOWN DISPOSABLE) ×2 IMPLANT
GUIDEWIRE ANG ZIPWIRE 038X150 (WIRE) ×2 IMPLANT
GUIDEWIRE STR DUAL SENSOR (WIRE) ×2 IMPLANT
IV NS 1000ML (IV SOLUTION) ×1
IV NS 1000ML BAXH (IV SOLUTION) ×1 IMPLANT
IV NS IRRIG 3000ML ARTHROMATIC (IV SOLUTION) ×2 IMPLANT
KIT ROOM TURNOVER WOR (KITS) ×2 IMPLANT
MANIFOLD NEPTUNE II (INSTRUMENTS) IMPLANT
NS IRRIG 500ML POUR BTL (IV SOLUTION) ×2 IMPLANT
PACK CYSTO (CUSTOM PROCEDURE TRAY) ×2 IMPLANT
STENT POLARIS LOOP 6FR X 24 CM (STENTS) ×2 IMPLANT
SYRINGE 10CC LL (SYRINGE) ×2 IMPLANT
TUBE CONNECTING 12X1/4 (SUCTIONS) IMPLANT
TUBE FEEDING 8FR 16IN STR KANG (MISCELLANEOUS) ×2 IMPLANT

## 2016-06-30 NOTE — Transfer of Care (Signed)
Immediate Anesthesia Transfer of Care Note  Patient: [facility]  Procedure(s) Performed: Procedure(s): CYSTOSCOPY WITH RETROGRADE PYELOGRAM/URETERAL STENT EXCHANGE (Right)  Patient Location: PACU  Anesthesia Type:General  Level of Consciousness: awake, alert , oriented and patient cooperative  Airway & Oxygen Therapy: Patient Spontanous Breathing and Patient connected to nasal cannula oxygen  Post-op Assessment: Report given to RN and Post -op Vital signs reviewed and stable  Post vital signs: Reviewed and stable  Last Vitals:  Vitals:   06/30/16 0812  BP: (!) 115/54  Pulse: 99  Resp: 16  Temp: 36.9 C    Last Pain:  Vitals:   06/30/16 0828  TempSrc:   PainSc: 5       Patients Stated Pain Goal: 7 (26/94/85 4627)  Complications: No apparent anesthesia complications

## 2016-06-30 NOTE — Anesthesia Procedure Notes (Signed)
Procedure Name: LMA Insertion Date/Time: 06/30/2016 8:55 AM Performed by: Wanita Chamberlain Pre-anesthesia Checklist: Patient identified, Emergency Drugs available, Suction available, Patient being monitored and Timeout performed Patient Re-evaluated:Patient Re-evaluated prior to inductionOxygen Delivery Method: Circle system utilized Preoxygenation: Pre-oxygenation with 100% oxygen Intubation Type: IV induction Ventilation: Mask ventilation without difficulty LMA: LMA inserted LMA Size: 4.0 Number of attempts: 1 Placement Confirmation: positive ETCO2 and breath sounds checked- equal and bilateral Tube secured with: Tape Dental Injury: Teeth and Oropharynx as per pre-operative assessment

## 2016-06-30 NOTE — Brief Op Note (Signed)
06/30/2016  9:13 AM  PATIENT:  [name]  60 y.o. female  PRE-OPERATIVE DIAGNOSIS:  RIGHT URETERAL STRICTURE  POST-OPERATIVE DIAGNOSIS:  RIGHT URETERAL STRICTURE  PROCEDURE:  Procedure(s): CYSTOSCOPY WITH RETROGRADE PYELOGRAM/URETERAL STENT EXCHANGE (Right)  SURGEON:  Surgeon(s) and Role:    * Alexis Frock, MD - Primary  PHYSICIAN ASSISTANT:   ASSISTANTS: none   ANESTHESIA:   general  EBL:  Total I/O In: -  Out: 5 [Blood:5]  BLOOD ADMINISTERED:none  DRAINS: none   LOCAL MEDICATIONS USED:  NONE  SPECIMEN:  No Specimen  DISPOSITION OF SPECIMEN:  N/A  COUNTS:  YES  TOURNIQUET:  * No tourniquets in log *  DICTATION: .Other Dictation: Dictation Number [id number]  PLAN OF CARE: Discharge to home after PACU  PATIENT DISPOSITION:  PACU - hemodynamically stable.   Delay start of Pharmacological VTE agent (>24hrs) due to surgical blood loss or risk of bleeding: yes

## 2016-06-30 NOTE — Discharge Instructions (Signed)
Post Anesthesia Home Care Instructions  Activity: Get plenty of rest for the remainder of the day. A responsible adult should stay with you for 24 hours following the procedure.  For the next 24 hours, DO NOT: -Drive a car -Paediatric nurse -Drink alcoholic beverages -Take any medication unless instructed by your physician -Make any legal decisions or sign important papers.  Meals: Start with liquid foods such as gelatin or soup. Progress to regular foods as tolerated. Avoid greasy, spicy, heavy foods. If nausea and/or vomiting occur, drink only clear liquids until the nausea and/or vomiting subsides. Call your physician if vomiting continues.  Special Instructions/Symptoms: Your throat may feel dry or sore from the anesthesia or the breathing tube placed in your throat during surgery. If this causes discomfort, gargle with warm salt water. The discomfort should disappear within 24 hours.  If you had a scopolamine patch placed behind your ear for the management of post- operative nausea and/or vomiting:  1. The medication in the patch is effective for 72 hours, after which it should be removed.  Wrap patch in a tissue and discard in the trash. Wash hands thoroughly with soap and water. 2. You may remove the patch earlier than 72 hours if you experience unpleasant side effects which may include dry mouth, dizziness or visual disturbances. 3. Avoid touching the patch. Wash your hands with soap and water after contact with the patch.   1 - You may have urinary urgency (bladder spasms) and bloody urine on / off with stent in place. This is normal.  2 - Call MD or go to ER for fever >102, severe pain / nausea / vomiting not relieved by medications, or acute change in medical status [facility] [phone] Post Ureteroscopy With or Without Stent Instructions  Definitions:  Ureter: The duct that transports urine from the kidney to the bladder. Stent:   A plastic hollow  tube that is placed into the ureter, from the kidney to the                 bladder to prevent the ureter from swelling shut.  GENERAL INSTRUCTIONS:  Despite the fact that no skin incisions were used, the area around the ureter and bladder is raw and irritated. The stent is a foreign body which will further irritate the bladder wall. This irritation is manifested by increased frequency of urination, both day and night, and by an increase in the urge to urinate. In some, the urge to urinate is present almost always. Sometimes the urge is strong enough that you may not be able to stop yourself from urinating. The only real cure is to remove the stent and then give time for the bladder wall to heal which can't be done until the danger of the ureter swelling shut has passed, which varies.  You may see some blood in your urine while the stent is in place and a few days afterwards. Do not be alarmed, even if the urine was clear for a while. Get off your feet and drink lots of fluids until clearing occurs. If you start to pass clots or don't improve, call us.  DIET: You may return to your normal diet immediately. Because of the raw surface of your bladder, alcohol, spicy foods, acid type foods and drinks with caffeine may cause irritation or frequency and should be used in moderation. To keep your urine flowing freely and to avoid constipation, drink plenty of fluids during the day ( 8-10 glasses ).  Tip: Avoid cranberry juice because it is very acidic.  ACTIVITY: Your physical activity doesn't need to be restricted. However, if you are very active, you may see some blood in your urine. We suggest that you reduce your activity under these circumstances until the bleeding has stopped.  BOWELS: It is important to keep your bowels regular during the postoperative period. Straining with bowel movements can cause bleeding. A bowel movement every other day is reasonable. Use a mild laxative if needed, such as Milk  of Magnesia 2-3 tablespoons, or 2 Dulcolax tablets. Call if you continue to have problems. If you have been taking narcotics for pain, before, during or after your surgery, you may be constipated. Take a laxative if necessary.   MEDICATION: You should resume your pre-surgery medications unless told not to. In addition you will often be given an antibiotic to prevent infection. These should be taken as prescribed until the bottles are finished unless you are having an unusual reaction to one of the drugs.  PROBLEMS YOU SHOULD REPORT TO [place]:  Fevers over 100.5 Fahrenheit.  Heavy bleeding, or clots ( See above notes about blood in urine ).  Inability to urinate.  Drug reactions ( hives, rash, nausea, vomiting, diarrhea ).  Severe burning or pain with urination that is not improving.  FOLLOW-UP: You will need a follow-up appointment to monitor your progress. Call for this appointment at the number listed above. Usually the first appointment will be about three to fourteen days after your surgery.

## 2016-06-30 NOTE — H&P (Signed)
[name] is an 60 y.o. female.    Chief Complaint: Pre-op RIGHT ureteral stent exchange  HPI:   1 -  Metastatic Endometrial Cancer - s/p repeat resection 11/2014 with colon and vaginal involvment but negative nodes / margins. Had adjuvant chemo-XRT which she is now done with. Follows with Dr. Alvy Bimler.    2 - Right Ureteral Stricture - New Rt hydro by CT 03/2015 to distal ureter on eval flank pain.    Recent Course:   03/2015 - Operative cysto / ureteroscopy c/w likley 2-3cm stricture (distal end about 3cm proximal to UO, balloon to 14F, 8x24 JJ stent placed)   04/2015 - Ureteroscopy patent ureter, JJ stent removed   08/2015 - CT Rt hydro to distal ureter (mildly increased) no flank pain, Cr 1.2 ==> Rt 5x24 polaris stent 09/2015   03/2016 - Rt 6x24 polaris (moderately encrusted)     PMH sig for DM2, morbid obesity, breast cancer, ovarian cancer, endometrial cancer. Her PCP is [name].    Today "[name]" is seen to proceed with elective right ureteral stent change for chronic malignant / radiation -induced stricture.   Past Medical History:  Diagnosis Date  . Anemia in neoplastic disease   . Benign essential HTN   . 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 Cancer of corpus uteri, except isthmus [facility]) dx 10-15-2004 oncologist-- dr Denman [name] and dr Alvy Bimler     dx endometrial and ovarian cancer s/p  chemotheapy and surgery:  recurrent 11-19-2014 w/ radiation 01-29-2015 to 03-10-2015  . Chronic idiopathic neutropenia (HCC)    presumed related to chemotherapy March 2006--- followed by dr Alvy Bimler   . Complication of anesthesia    PONV  . Diabetic retinopathy, background ([place])   . DM type 2 (diabetes mellitus, type 2) ([place])    monitored by dr Legrand Como altheimer  . Dysuria   . GERD  (gastroesophageal reflux disease)    takes omeprazole  . Hiatal hernia   . History of bronchitis   . History of gastric polyp    2014  duodenum  . History of radiation therapy    01-29-2015 to 03-10-2015  pelvis 50.4Gy  . Hypothyroidism    monitored by dr Legrand Como altheimer  . Mixed dyslipidemia   . Multiple thyroid nodules    Managed by Dr. Harlow Asa  . PONV (postoperative nausea and vomiting)    "scopolamine patch works for me"  . Radiation-induced dermatitis    contact dermatitis , radiation completed, rash only on ankles now.  . Right flank pain   . S/P colostomy ([place])   . Seasonal allergies   . Shoulder stiffness    bilateral   . Ureteral stricture, right UROLOGIT-  DR [facility]   CHRONIC--  TREATMENT URETERAL STENT  . Vitamin D deficiency   . Wears glasses     Past Surgical History:  Procedure Laterality Date  . APPENDECTOMY    . BREAST RECONSTRUCTION WITH PLACEMENT OF TISSUE EXPANDER AND FLEX HD (ACELLULAR HYDRATED DERMIS) Bilateral 12/11/2015   Procedure: BILATERAL BREAST RECONSTRUCTION WITH PLACEMENT OF TISSUE EXPANDERS;  Surgeon: Irene Limbo, MD;  Location: [place];  Service: Plastics;  Laterality: Bilateral;  . COLONOSCOPY WITH PROPOFOL N/A 08/21/2013   Procedure: COLONOSCOPY WITH PROPOFOL;  Surgeon: Cleotis Nipper, MD;  Location: [facility];  Service: Endoscopy;  Laterality: N/A;  .  COLOSTOMY TAKEDOWN N/A 12/04/2014   Procedure: LAPROSCOPIC LYSIS OF ADHESIONS, SPLENIC MOBILIZATION, RELOCATION OF COLOSTOMY, DEBRIDEMENT INITIAL COLOSTOMY SITE;  Surgeon: Michael [name], MD;  Location: [facility];  Service: General;  Laterality: N/A;  . CYSTOSCOPY W/ RETROGRADES Right 11/21/2015   Procedure: CYSTOSCOPY WITH RETROGRADE PYELOGRAM;  Surgeon: Alexis Frock, MD;  [facility];  Service: Urology;  Laterality: Right;  . CYSTOSCOPY W/ URETERAL STENT PLACEMENT Right 11/21/2015   Procedure: CYSTOSCOPY WITH STENT REPLACEMENT;  Surgeon: Alexis Frock, MD;  [facility];  Service:  Urology;  Laterality: Right;  . CYSTOSCOPY W/ URETERAL STENT PLACEMENT Right 03/10/2016   Procedure: CYSTOSCOPY WITH STENT REPLACEMENT;  Surgeon: Alexis Frock, MD;  Location: [facility];  Service: Urology;  Laterality: Right;  . CYSTOSCOPY WITH RETROGRADE PYELOGRAM, URETEROSCOPY AND STENT PLACEMENT Right 03/20/2015   Procedure: CYSTOSCOPY WITH RETROGRADE PYELOGRAM, URETEROSCOPY WITH BALLOON DILATION AND STENT PLACEMENT ON RIGHT;  Surgeon: Alexis Frock, MD;  Location: [facility];  Service: Urology;  Laterality: Right;  . CYSTOSCOPY WITH RETROGRADE PYELOGRAM, URETEROSCOPY AND STENT PLACEMENT Right 05/02/2015   Procedure: CYSTOSCOPY WITH RIGHT RETROGRADE PYELOGRAM,  DIAGNOSTIC URETEROSCOPY AND STENT PULL ;  Surgeon: Alexis Frock, MD;  Location: [facility];  Service: Urology;  Laterality: Right;  . CYSTOSCOPY WITH RETROGRADE PYELOGRAM, URETEROSCOPY AND STENT PLACEMENT Right 09/05/2015   Procedure: CYSTOSCOPY WITH RETROGRADE PYELOGRAM,  AND STENT PLACEMENT;  Surgeon: Alexis Frock, MD;  [facility];  Service: Urology;  Laterality: Right;  . EUS N/A 10/02/2014   Procedure: LOWER ENDOSCOPIC ULTRASOUND (EUS);  Surgeon: Arta Silence, MD;  Location: [facility];  Service: Endoscopy;  Laterality: N/A;  . EXCISION SOFT TISSUE MASS RIGHT FOREMAN  12-08-2006  . EYE SURGERY  as child   pytosis of eyelids repair  . INCISION AND DRAINAGE OF WOUND Bilateral 12/26/2015   Procedure: DEBRIDEMENT OF BILATERAL MASTECTOMY FLAPS;  Surgeon: Irene Limbo, MD;  Location: [place];  Service: Plastics;  Laterality: Bilateral;  . LAPAROSCOPIC CHOLECYSTECTOMY  1990  . LIPOSUCTION WITH LIPOFILLING Bilateral 04/16/2016   Procedure: LIPOSUCTION WITH LIPOFILLING TO BILATERAL CHEST;  Surgeon: Irene Limbo, MD;  Location: [place];  Service: Plastics;  Laterality: Bilateral;  . MASTECTOMY Right 12/11/2015   PROPHYLACTIC   . MASTECTOMY  COMPLETE / SIMPLE W/ SENTINEL NODE BIOPSY Left 12/11/2015   AXILLARY SENTINEL LYMPH NODE BIOPSY   . MASTECTOMY W/ SENTINEL NODE BIOPSY Bilateral 12/11/2015   Procedure: RIGHT PROPHYLACTIC MASTECTOMY, LEFT TOTAL MASTECTOMY WITH LEFT AXILLARY SENTINEL LYMPH NODE BIOPSY;  Surgeon: Stark Klein, MD;  Location: [place];  Service: General;  Laterality: Bilateral;  . OSTOMY N/A 11/19/2014   Procedure: OSTOMY;  Surgeon: Michael [name], MD;  [facility];  Service: General;  Laterality: N/A;  . REMOVAL OF BILATERAL TISSUE EXPANDERS WITH PLACEMENT OF BILATERAL BREAST IMPLANTS Bilateral 04/16/2016   Procedure: REMOVAL OF BILATERAL TISSUE EXPANDERS WITH PLACEMENT OF BILATERAL BREAST IMPLANTS;  Surgeon: Irene Limbo, MD;  Location: [place];  Service: Plastics;  Laterality: Bilateral;  . ROBOTIC ASSISTED LAP VAGINAL HYSTERECTOMY N/A 11/19/2014   Procedure: ROBOTIC LYSIS OF ADHESIONS, CONVERTED TO LAPAROTOMY RADICAL UPPER VAGINECTOMY,LOW ANTERIOR BOWEL RESECTION, COLOSTOMY, BILATERAL URETERAL STENT PLACEMENT AND CYSTONOMY CLOSURE;  Surgeon: Everitt Amber, MD;  Location[facility];  Service: Gynecology;  Laterality: N/A;  . TISSUE EXPANDER FILLING Bilateral 12/26/2015   Procedure: EXPANSION OF BILATERAL CHEST TISSUE EXPANDERS (60 mL- Right; 75 mL- Left);  Surgeon: Irene Limbo, MD;  Location: [place];  Service: Clinical cytogeneticist;  Laterality: Bilateral;  . TONSILLECTOMY    . TOTAL ABDOMINAL HYSTERECTOMY  March 2006   [facility]   and Bilateral Salpingoophorectomy/  staging for Ovarian cancer/  an    Family History  Problem Relation Age of Onset  . Cancer Mother 48    stomach ca  . Hypertension Mother   . Cancer Father 32    prostate ca  . Diabetes Father   . Heart disease Father     CABG  . Breast cancer Maternal Aunt     dx in her 20s  . Lymphoma Paternal Aunt   . Brain cancer Paternal Grandfather   . Ovarian cancer Other   . Diabetes Sister   . Hypertension Brother y-10  .  Heart disease Brother     CABG  . Diabetes Brother    Social History:  reports that she has never smoked. She has never used smokeless tobacco. She reports that she drinks alcohol. She reports that she does not use drugs.  Allergies:  Allergies  Allergen Reactions  . Penicillins Swelling    Facial swelling Has patient had a PCN reaction causing immediate rash, facial/tongue/throat swelling, SOB or lightheadedness with hypotension: Yes Has patient had a PCN reaction causing severe rash involving mucus membranes or skin necrosis: Yes Has patient had a PCN reaction that required hospitalization No Has patient had a PCN reaction occurring within the last 10 years: No If all of the above answers are "NO", then may proceed with Cephalosporin use.   [name] Kitchen Ultram [Tramadol] Hives  . Adhesive [Tape]     blisters  . Cefaclor Rash    Ceclor  . Erythromycin     Gastritis, abd cramps  . Trimethoprim Rash  . Pectin Rash    Pectin ring for stoma  . Sulfa Antibiotics Rash    No prescriptions prior to admission.    No results found for this or any previous visit (from the past 48 hour(s)). No results found.  Review of Systems  Constitutional: Negative.  Negative for chills and fever.  HENT: Negative.   Eyes: Negative.   Respiratory: Negative.   Gastrointestinal: Negative.   Genitourinary: Positive for urgency.  Musculoskeletal: Negative.   Skin: Negative.   Neurological: Negative.   Endo/Heme/Allergies: Negative.   Psychiatric/Behavioral: Negative.     Height 5' 3" (1.6 m), weight 89.4 kg (197 lb). Physical Exam  Constitutional: She appears well-developed.  HENT:  Head: Normocephalic.  Eyes: Pupils are equal, round, and reactive to light.  Neck: Normal range of motion.  Cardiovascular: Normal rate.   Respiratory: Effort normal.  GI:  Stable truncal obesity with multiple scars w/o hernias.   Genitourinary:  Genitourinary Comments: No CVAT.   Musculoskeletal: Normal range of  motion.  Neurological: She is alert.  Skin: Skin is warm.  Psychiatric: She has a normal mood and affect.     Assessment/Plan Continue Q4 mo outpatient JJ stent change until at least mid 2018, then cautiosly consider ureteral reimplant, understanding her chances of success are significantly lower given prior pelvic radiation.   Risks, benefits, alternatives, expected peri-op course discussed previously and reiterated today.    Alexis Frock, MD 06/30/2016, 6:42 AM

## 2016-06-30 NOTE — Anesthesia Preprocedure Evaluation (Addendum)
Anesthesia Evaluation  Patient identified by MRN, date of birth, ID band Patient awake    Reviewed: Allergy & Precautions, NPO status , Patient's Chart, lab work & pertinent test results  History of Anesthesia Complications (+) PONV and history of anesthetic complications  Airway Mallampati: I  TM Distance: >3 FB Neck ROM: Full    Dental  (+) Teeth Intact, Dental Advisory Given   Pulmonary neg pulmonary ROS,    breath sounds clear to auscultation       Cardiovascular hypertension, Pt. on medications  Rhythm:Regular Rate:Normal     Neuro/Psych  Neuromuscular disease negative psych ROS   GI/Hepatic Neg liver ROS, hiatal hernia, GERD  Controlled,  Endo/Other  diabetes, Type 2, Oral Hypoglycemic AgentsHypothyroidism   Renal/GU Renal InsufficiencyRenal disease  negative genitourinary   Musculoskeletal negative musculoskeletal ROS (+)   Abdominal   Peds negative pediatric ROS (+)  Hematology negative hematology ROS (+) anemia ,   Anesthesia Other Findings Ptosis OD  Reproductive/Obstetrics negative OB ROS                           Lab Results  Component Value Date   WBC 3.2 (L) 04/14/2016   HGB 11.7 (L) 04/14/2016   HCT 37.8 04/14/2016   MCV 83.4 04/14/2016   PLT 274 04/14/2016   Lab Results  Component Value Date   CREATININE 1.02 (H) 04/14/2016   BUN 20 04/14/2016   NA 140 04/14/2016   K 3.8 04/14/2016   CL 102 04/14/2016   CO2 30 04/14/2016   Lab Results  Component Value Date   INR 1.18 12/31/2014   INR 1.22 05/09/2009   12/2015 EKG: normal sinus rhythm.   Anesthesia Physical Anesthesia Plan  ASA: II  Anesthesia Plan: General   Post-op Pain Management:    Induction: Intravenous  Airway Management Planned: LMA  Additional Equipment:   Intra-op Plan:   Post-operative Plan: Extubation in OR  Informed Consent: I have reviewed the patients History and Physical,  chart, labs and discussed the procedure including the risks, benefits and alternatives for the proposed anesthesia with the patient or authorized representative who has indicated his/her understanding and acceptance.   Dental advisory given  Plan Discussed with: CRNA  Anesthesia Plan Comments:         Anesthesia Quick Evaluation

## 2016-06-30 NOTE — Anesthesia Postprocedure Evaluation (Signed)
Anesthesia Post Note  Patient: Financial planner  Procedure(s) Performed: Procedure(s) (LRB): CYSTOSCOPY WITH RETROGRADE PYELOGRAM/URETERAL STENT EXCHANGE (Right)  Patient location during evaluation: PACU Anesthesia Type: General Level of consciousness: awake and alert Pain management: pain level controlled Vital Signs Assessment: post-procedure vital signs reviewed and stable Respiratory status: spontaneous breathing, nonlabored ventilation, respiratory function stable and patient connected to nasal cannula oxygen Cardiovascular status: blood pressure returned to baseline and stable Postop Assessment: no signs of nausea or vomiting Anesthetic complications: no    Last Vitals:  Vitals:   06/30/16 1000 06/30/16 1031  BP: (!) 100/58 (!) 105/53  Pulse: 72 (!) 45  Resp: 18 16  Temp:  36.9 C    Last Pain:  Vitals:   06/30/16 0828  TempSrc:   PainSc: [place]

## 2016-07-01 ENCOUNTER — Encounter (HOSPITAL_BASED_OUTPATIENT_CLINIC_OR_DEPARTMENT_OTHER): Payer: Self-pay | Admitting: Urology

## 2016-07-01 NOTE — Op Note (Signed)
[name], ARD                ACCOUNT NO.:  [id number]  MEDICAL RECORD NO.:  [id number]  LOCATION:                                 FACILITY:  PHYSICIAN:  Alexis Frock, MD     DATE OF BIRTH:  July 19, 1956  DATE OF PROCEDURE: 06/30/2016                               OPERATIVE REPORT   DIAGNOSES:  Right ureteral stricture, history of advanced endometrial cancer.  PROCEDURES: 1. Cystoscopy with right retrograde pyelogram and interpretation. 2. Exchange of right ureteral stent, 6 x 24 Polaris, no tether.  ESTIMATED BLOOD LOSS:  Nil.  COMPLICATION:  None.  SPECIMEN:  None.  FINDINGS: 1. Moderate hydroureteronephrosis to the level of the midureter and     area where numerous surgical clips were seen on fluoroscopy     consistent with known mid-to-distal ureteral stricture. 2. Successful replacement of right ureteral stent, proximal end in the     renal pelvis and distal end in the urinary bladder. 3. Mild distal encrustation of in-situ stent prior to exchange.  INDICATION:  [name] is a pleasant, but unfortunate 60 year old lady with history of multiple foci gynecologic and reproductive cancer including advanced endometrial cancer as well as breast cancer.  She is status post pelvic extirpative surgery previously for advanced endometrial cancer, subsequently underwent chemoradiation and has had a chronic right ureteral stricture, resultant.  This has been managed with stent changes q.4-6 months, which she has tolerated quite well.  She is due for stent exchange.  Informed consent was obtained and placed in the medical record.  PROCEDURE IN DETAIL:  The patient being [name], was verified. Procedure being right ureteral stent exchange was confirmed.  Procedure was carried out.  Time-out was performed.  Intravenous antibiotics were administered.  General LMA anesthesia was induced.  The patient was placed into a low lithotomy position and sterile field was created  by prepping and draping the patient's vagina, introitus and proximal thighs using iodinated prep.  Next, cystourethroscopy was performed using a rigid cystoscope with offset lens.  Inspection of the urinary bladder revealed no diverticula, calcifications, papular lesions.  There were some mild radiation changes throughout the urinary bladder that were quite stable, the distal end of right ureteral stent was seen in situ. This was grasped proximal of the urethral meatus, through which, a 0.038 Sensor wire was advanced at the level of the upper pole with exchange of an open-ended catheter and right retrograde pyelogram were obtained.  Right retrograde pyelogram demonstrated a single right ureter with single-system right kidney.  There was mild hydroureteronephrosis to level of narrowing in the mid-to-distal ureter, also multiple surgical clips in this location consistent with known stricture.  The Sensor wire was once again advanced to the level of the upper pole and the open- ended catheter was exchanged for a new 6 x 24 Polaris-type stent using fluoroscopic guidance.  Good proximal and distal deployment were noted and procedure was terminated.  The patient tolerated the procedure well. There were no immediate periprocedural complications.  The patient was taken to the postanesthesia care unit in stable condition.    ______________________________ Alexis Frock, MD   ______________________________ Alexis Frock, MD  TM/MEDQ  D:  06/30/2016  T:  07/01/2016  Job:  [id number]

## 2016-07-05 ENCOUNTER — Ambulatory Visit: Payer: BLUE CROSS/BLUE SHIELD | Attending: Gynecologic Oncology | Admitting: Gynecologic Oncology

## 2016-07-05 ENCOUNTER — Encounter: Payer: Self-pay | Admitting: Gynecologic Oncology

## 2016-07-05 VITALS — BP 119/61 | HR 61 | Temp 97.4°F | Resp 17 | Ht 63.0 in | Wt 199.6 lb

## 2016-07-05 DIAGNOSIS — Z9889 Other specified postprocedural states: Secondary | ICD-10-CM | POA: Diagnosis not present

## 2016-07-05 DIAGNOSIS — Z7984 Long term (current) use of oral hypoglycemic drugs: Secondary | ICD-10-CM | POA: Insufficient documentation

## 2016-07-05 DIAGNOSIS — Z8543 Personal history of malignant neoplasm of ovary: Secondary | ICD-10-CM

## 2016-07-05 DIAGNOSIS — Z79899 Other long term (current) drug therapy: Secondary | ICD-10-CM | POA: Diagnosis not present

## 2016-07-05 DIAGNOSIS — E039 Hypothyroidism, unspecified: Secondary | ICD-10-CM | POA: Diagnosis not present

## 2016-07-05 DIAGNOSIS — K219 Gastro-esophageal reflux disease without esophagitis: Secondary | ICD-10-CM | POA: Insufficient documentation

## 2016-07-05 DIAGNOSIS — Z888 Allergy status to other drugs, medicaments and biological substances status: Secondary | ICD-10-CM | POA: Insufficient documentation

## 2016-07-05 DIAGNOSIS — Z8542 Personal history of malignant neoplasm of other parts of uterus: Secondary | ICD-10-CM | POA: Diagnosis not present

## 2016-07-05 DIAGNOSIS — D630 Anemia in neoplastic disease: Secondary | ICD-10-CM | POA: Diagnosis not present

## 2016-07-05 DIAGNOSIS — E11319 Type 2 diabetes mellitus with unspecified diabetic retinopathy without macular edema: Secondary | ICD-10-CM | POA: Diagnosis not present

## 2016-07-05 DIAGNOSIS — Z853 Personal history of malignant neoplasm of breast: Secondary | ICD-10-CM | POA: Insufficient documentation

## 2016-07-05 DIAGNOSIS — K449 Diaphragmatic hernia without obstruction or gangrene: Secondary | ICD-10-CM | POA: Diagnosis not present

## 2016-07-05 DIAGNOSIS — D709 Neutropenia, unspecified: Secondary | ICD-10-CM | POA: Diagnosis not present

## 2016-07-05 DIAGNOSIS — Z8041 Family history of malignant neoplasm of ovary: Secondary | ICD-10-CM | POA: Insufficient documentation

## 2016-07-05 DIAGNOSIS — Z923 Personal history of irradiation: Secondary | ICD-10-CM | POA: Diagnosis not present

## 2016-07-05 DIAGNOSIS — E559 Vitamin D deficiency, unspecified: Secondary | ICD-10-CM | POA: Insufficient documentation

## 2016-07-05 DIAGNOSIS — E782 Mixed hyperlipidemia: Secondary | ICD-10-CM | POA: Diagnosis not present

## 2016-07-05 DIAGNOSIS — I10 Essential (primary) hypertension: Secondary | ICD-10-CM | POA: Insufficient documentation

## 2016-07-05 DIAGNOSIS — C541 Malignant neoplasm of endometrium: Secondary | ICD-10-CM

## 2016-07-05 DIAGNOSIS — Z8249 Family history of ischemic heart disease and other diseases of the circulatory system: Secondary | ICD-10-CM | POA: Insufficient documentation

## 2016-07-05 DIAGNOSIS — Z880 Allergy status to penicillin: Secondary | ICD-10-CM | POA: Insufficient documentation

## 2016-07-05 DIAGNOSIS — Z170 Estrogen receptor positive status [ER+]: Secondary | ICD-10-CM | POA: Insufficient documentation

## 2016-07-05 DIAGNOSIS — C569 Malignant neoplasm of unspecified ovary: Secondary | ICD-10-CM | POA: Diagnosis not present

## 2016-07-05 DIAGNOSIS — Z9221 Personal history of antineoplastic chemotherapy: Secondary | ICD-10-CM | POA: Diagnosis not present

## 2016-07-05 DIAGNOSIS — Z807 Family history of other malignant neoplasms of lymphoid, hematopoietic and related tissues: Secondary | ICD-10-CM | POA: Insufficient documentation

## 2016-07-05 DIAGNOSIS — Z833 Family history of diabetes mellitus: Secondary | ICD-10-CM | POA: Insufficient documentation

## 2016-07-05 DIAGNOSIS — Z933 Colostomy status: Secondary | ICD-10-CM | POA: Diagnosis not present

## 2016-07-05 DIAGNOSIS — Z800 Family history of malignant neoplasm of digestive organs: Secondary | ICD-10-CM | POA: Diagnosis not present

## 2016-07-05 DIAGNOSIS — Z9013 Acquired absence of bilateral breasts and nipples: Secondary | ICD-10-CM | POA: Diagnosis not present

## 2016-07-05 DIAGNOSIS — Z803 Family history of malignant neoplasm of breast: Secondary | ICD-10-CM | POA: Insufficient documentation

## 2016-07-05 NOTE — Progress Notes (Signed)
Recurrent Endometrial cancer FOLLOWUP VISIT  Assessment:    60 y.o. year old with history of recurrent endometrioid endometrial/ovarian cancer.   S/p exploratory laparotomy, posterior supralevator pelvic exenteration, end colostomy, bladder repair, ureteral stenting with complete resection and negative margins on 11/09/14. 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 S/p hospital admission 12/20/14 for urosepsis  S/p adjuvant radiation completed 03/10/15  Postoperative and post-radiation right hydroureter and distal ureteral obstruction.  No convincing evidence for recurrence/persistence of tumor on imaging and physical exam (complete clinical response).   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 No specific follow-up or surveillance recommended by genetics counselor.  Plan: 1) Recurrent endometrial/ovarian cancer: s/p complete resection with negative margins and s/p adjuvant radiation. No chemotherapy due to chronic idiopathic neutropenia.   CT imaging post treatment shows stable thickening of upper vagina and right peri-urethral tissues which is most likely radiation changes and postop changes.  2) Right hydroureter and distal ureteral obstruction - secondary to post operative scaring and post-radiation changes. Appreciate Dr Zettie Pho assistance with dilation and stenting. Last stent exchange November 2017. Consideration for re-implantation and possibly colostomy reversal when she is 2+ years from diagnosis (April, 2018)  3) neutropenia - Dr Alvy Bimler is managing this and we appreciate.  4) breast cancer - stage IIA. ER/PR positive. Treated with surgery and arimidex. Reconstruction planned for September 2017.  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 Will have patietn follow-up with Dr Cristina Gong after breast surgery for close colon cancer and upper GI cancer surveillance  6)  Follow-up: I will see [name] for followup in March, 2018, with a follow-up CT chest, abdomen and pelvis.  HPI:  [name] is a 60 y.o. 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  She has a history of endometrial and ovarian endometrioid carcinoma treated in 2006 by Dr. [name] at [facility] in [place]. Her surgery (TAH, BSO) was followed by adjuvant chemotherapy with carboplatin plus paclitaxel due to the ovarian involvement and the presence of a cul de sac lesion also positive for disease. She denies receiving adjuvant radiation therapy. She is unclear if she had metastatic endometrial cancer to the ovary or duel primaries. She had a complete response to therapy however developed leukopenia in July 2010. 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 She sees Dr. Alvy Bimler for this and is treated with G-CSF injections.  She began experiencing rectal pain approximately in January 2016. She also reports narrowing of caliber of the stool. She denies hematochezia. She does report approximately 3 months of vaginal spotting. She's had no specific follow-up for her gynecologic cancers in the past 4 years.  As part of workup of her rectal pain she underwent a CT scan of the abdomen and pelvis on 09/12/2014. This demonstrated a new right perirectal mass abutting the vaginal cuff measuring 3.8 x 4.9 cm. There is a limited fat plane between the mass and the rectum posteriorly. Rectal invasion could not be excluded. There were no other masses identified in the abdomen and pelvis or lymphadenopathy. There is no other evidence of metastatic disease or recurrent disease. There was no hydronephrosis. A CA-125 drawn on 09/12/2014 was normal at 10.  PET was negative for  extrapelvic disease. MRI defined the lesion  as a 3.5x5cm lesion to the right of the rectum at the vaginal cuff.  Colonoscopy was performed on 10/02/14 with transrectal [place] and biopsy and this revealed endometrioid adenocarcinoma. Of note, the lesion was not seen within the lumen of the rectum.   She then underwent a posterior supralevator exenteration with colostomy and bladder repair and stent placement on 6/81/27 without complications.  Her postoperative course was uncomplicate with the exception of development of postop anemia.  Her final pathology revealed endometrioid adenocarcinoma invading the vagina and rectum with negative margins on the specimen. The margin had been close (clinically) to the right pelvic sidewall which was marked with surgical clips.  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 It was treated with IV antibiotics and then on POD 15 she was taken to the OR for laparoscopic revision of the stoma with Dr Michael [name]. Postoperatively she had wound vac and packing for her stomal wound.  A retrograde cystogram on week 4 postop confirmed an intact bladder and the foley was removed.  She initially had some voiding issues with decreased sensation to void. We tested a post void residual in May, 2016 and this revealed adequate voiding.  On 12/30/14 she was admitted to [facility] with sepsis associated with Enterobacter Cloacae UTI. This was treated with IV antibiotics and then a prolonged course of oral cipro. Imaging performed at the time of admission (a CT of the abdo/pelvis) revealed: Bilateral double-J internal ureteral stents in adequate position with persistent mild bilateral hydronephrosis   She complete radiation therapy from 01/29/15 to 03/10/15 with 50Gy of external beam radiation and IMRT. She tolerated therapy well with minor skin irritation.  She had her ureteral stents removed in June, 2016, however, then  developed right ureteral obstruction, hydroureter and pain. She went to the OR with Dr Tresa Moore on 03/20/15 for ureteral dilation and right stent placement (the left was draining well). No tumor was seen on cysto.   Right ureteral stent now out. No vaginal bleeding. Stoma working well. Gaining weight. Mood improved.   CT abdo/pelvis on 05/14/15 showed: no new lesions, stable thickening in right pelvis/distal right ureter consistent with radiation effect.  She has had bronchitis symptoms for >47month s/p levaquin trial.  The patient's CT in January 2017 showed progression of her right ureteral obstruction after stent removal. Dr MTresa Mooretook her to the OR on 09/05/15 for a cystoscopy, retrograde pyelogram and right ureteral stent placement.   The CT on 08/18/15 showed decreased attenuation of the right renal parenchyma, right hydronephrosis that was severe no excretion of contrast on that for graphic phase imaging. The perivascular soft tissue thickening in the pelvis and presacral regions grossly stable consistent with radiation changes.  The patient was diagnosed with ER/PR positive left breast cancer in April 2017. Surgery is scheduled for May. She is having a bilateral mastectomy with expander reconstruction.  Interval Hx:  Repeat CT imaging on 02/08/16 revealed : Stable post treatment changes in presacral region. No evidence of recurrent or metastatic carcinoma within the abdomen or pelvis. Interval placement of right ureteral stent in appropriate position. Moderate right hydroureteronephrosis remains stable. Stable benign left adrenal adenoma and hepatic steatosis.  On 06/30/16 she had replacement of her right ureteral stent with Dr MTresa Moore  She has a planned plastic surgery/reconstruction in September, 2017.  She had one singular episode of vaginal spotting 1 week ago. Unclear if from urethra or from vagina.  Review of systems: Constitutional:  She has no weight gain or weight loss. She has a low  grade fever no chills. Eyes: No blurred vision Ears, Nose, Mouth, Throat: No dizziness, headaches or changes in hearing. No mouth sores. Cardiovascular: No chest pain, palpitations or edema. Respiratory:  No shortness of breath, wheezing  Gastrointestinal: She has normal bowel movements trhough stoma without diarrhea or constipation. She denies any nausea or vomiting. She denies blood in her stool or heart burn. Genitourinary:  See HPI Musculoskeletal: Denies muscle weakness or joint pains.  Skin:  She has no skin changes, rashes or itching Neurological:  Denies dizziness or headaches. No neuropathy, no numbness or tingling. Psychiatric:  She denies depression or anxiety. Hematologic/Lymphatic:   No easy bruising or bleeding  Allergies  Allergen Reactions  . Penicillins Swelling    Facial swelling Has patient had a PCN reaction causing immediate rash, facial/tongue/throat swelling, SOB or lightheadedness with hypotension: Yes Has patient had a PCN reaction causing severe rash involving mucus membranes or skin necrosis: Yes Has patient had a PCN reaction that required hospitalization No Has patient had a PCN reaction occurring within the last 10 years: No If all of the above answers are "NO", then may proceed with Cephalosporin use.   [name] Kitchen Ultram [Tramadol] Hives  . Adhesive [Tape]     blisters  . Cefaclor Rash    Ceclor  . Erythromycin     Gastritis, abd cramps  . Trimethoprim Rash  . Pectin Rash    Pectin ring for stoma  . Sulfa Antibiotics Rash    Current Outpatient Prescriptions on File Prior to Visit  Medication Sig Dispense Refill  . anastrozole (ARIMIDEX) 1 MG tablet Take 1 tablet (1 mg total) by mouth daily. (Patient taking differently: Take 1 mg by mouth every morning. ) 90 tablet 3  . Biotin 5 MG TABS Take 5 mg by mouth every morning.     . Calcium-Magnesium-Vitamin D 400-166.7-133.3 MG-MG-UNIT TABS Take 2 tablets by mouth daily.    . Canagliflozin (INVOKANA) 300 MG  TABS Take 300 mg by mouth every morning.     . Cholecalciferol (VITAMIN D3) 10000 UNITS capsule Take 10,000 Units by mouth once a week. Sunday evening's    . diphenhydrAMINE (BENADRYL) 25 MG tablet Take 25 mg by mouth every 4 (four) hours as needed for itching, allergies or sleep. Reported on 09/15/2015    . filgrastim (NEUPOGEN) 480 MCG/1.6ML injection Inject 1.6 ml under the skin every 3 days for life 16 mL 11  . hyaluronate sodium (RADIAPLEXRX) GEL Apply 1 application topically 2 (two) times daily as needed (scars). Reported on 09/15/2015    . HYDROcodone-acetaminophen (NORCO) 10-325 MG tablet Take 1 tablet by mouth every 6 (six) hours as needed.    [name] Kitchen levothyroxine (SYNTHROID) 175 MCG tablet Take 87.5-175 mcg by mouth daily before breakfast. 185mg daily except 87.580m on Sundays.    . [name] Kitchenoratadine (CLARITIN) 10 MG tablet Take 10 mg by mouth every morning.     . metFORMIN (GLUCOPHAGE) 1000 MG tablet Take 1,000 mg by mouth 2 (two) times daily with a meal.     . methocarbamol (ROBAXIN) 500 MG tablet Take 1 tablet (500 mg total) by mouth every 8 (eight) hours as needed for muscle spasms. 30 tablet 0  . Multiple Vitamin (MULTIVITAMIN WITH MINERALS) TABS tablet Take 1 tablet by mouth daily.    . [name] Kitchenmega-3 acid ethyl esters (LOVAZA) 1 G capsule Take 1 g by mouth 2 (two) times daily.    .[name] Kitchen  omeprazole (PRILOSEC) 20 MG capsule Take 20 mg by mouth every other day. Other other night    . oxyCODONE (OXY IR/ROXICODONE) 5 MG immediate release tablet Take 1-2 tablets (5-10 mg total) by mouth every 4 (four) hours as needed for moderate pain (post-operatively). 10 tablet 0  . Polyethyl Glycol-Propyl Glycol (SYSTANE OP) Place 1 drop into both eyes at bedtime.     . polyethylene glycol (MIRALAX / GLYCOLAX) packet Take 17 g by mouth daily as needed. Reported on 01/19/2016    . rosuvastatin (CRESTOR) 10 MG tablet Take 10 mg by mouth every evening.     . sitaGLIPtin (JANUVIA) 100 MG tablet Take 100 mg by mouth every morning.      . valsartan (DIOVAN) 80 MG tablet Take 1 tablet (80 mg total) by mouth daily. (Patient taking differently: Take 80 mg by mouth every morning. )     No current facility-administered medications on file prior to visit.     Past Medical History:  Diagnosis Date  . Anemia in neoplastic disease   . Benign essential HTN   . 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 Cancer of corpus uteri, except isthmus [facility]) dx 10-15-2004 oncologist-- dr Denman [name] and dr Alvy Bimler     dx endometrial and ovarian cancer s/p  chemotheapy and surgery:  recurrent 11-19-2014 w/ radiation 01-29-2015 to 03-10-2015  . Chronic idiopathic neutropenia (HCC)    presumed related to chemotherapy March 2006--- followed by dr Alvy Bimler   . Complication of anesthesia    PONV  . Diabetic retinopathy, background ([place])   . DM type 2 (diabetes mellitus, type 2) ([place])    monitored by dr Legrand Como altheimer  . Dysuria   . GERD (gastroesophageal reflux disease)    takes omeprazole  . Hiatal hernia   . History of bronchitis   . History of gastric polyp    2014  duodenum  . History of radiation therapy    01-29-2015 to 03-10-2015  pelvis 50.4Gy  . Hypothyroidism    monitored by dr Legrand Como altheimer  . Mixed dyslipidemia   . Multiple thyroid nodules    Managed by Dr. Harlow Asa  . PONV (postoperative nausea and vomiting)    "scopolamine patch works for me"  . Radiation-induced dermatitis    contact dermatitis , radiation completed, rash only on ankles now.  . Right flank pain   . S/P colostomy ([place])   . Seasonal allergies   . Shoulder stiffness    bilateral   . Ureteral stricture, right UROLOGIT-  DR [facility]   CHRONIC--  TREATMENT URETERAL STENT  . Vitamin D deficiency   . Wears glasses     Past Surgical History:  Procedure Laterality Date  .  APPENDECTOMY    . BREAST RECONSTRUCTION WITH PLACEMENT OF TISSUE EXPANDER AND FLEX HD (ACELLULAR HYDRATED DERMIS) Bilateral 12/11/2015   Procedure: BILATERAL BREAST RECONSTRUCTION WITH PLACEMENT OF TISSUE EXPANDERS;  Surgeon: Irene Limbo, MD;  Location: [place];  Service: Plastics;  Laterality: Bilateral;  . COLONOSCOPY WITH PROPOFOL N/A 08/21/2013   Procedure: COLONOSCOPY WITH PROPOFOL;  Surgeon: Cleotis Nipper, MD;  Location: [facility];  Service: Endoscopy;  Laterality: N/A;  . COLOSTOMY TAKEDOWN N/A 12/04/2014   Procedure: LAPROSCOPIC LYSIS OF ADHESIONS, SPLENIC MOBILIZATION, RELOCATION OF COLOSTOMY, DEBRIDEMENT INITIAL COLOSTOMY SITE;  Surgeon: Michael [name], MD;  Location:  [facility];  Service: General;  Laterality: N/A;  . CYSTOSCOPY W/ RETROGRADES Right 11/21/2015   Procedure: CYSTOSCOPY WITH RETROGRADE PYELOGRAM;  Surgeon: Alexis Frock, MD;  Location: [facility];  Service: Urology;  Laterality: Right;  . CYSTOSCOPY W/ URETERAL STENT PLACEMENT Right 11/21/2015   Procedure: CYSTOSCOPY WITH STENT REPLACEMENT;  Surgeon: Alexis Frock, MD;  [facility]: [facility];  Service: Urology;  Laterality: Right;  . CYSTOSCOPY W/ URETERAL STENT PLACEMENT Right 03/10/2016   Procedure: CYSTOSCOPY WITH STENT REPLACEMENT;  Surgeon: Alexis Frock, MD;  Location: [facility];  Service: Urology;  Laterality: Right;  . CYSTOSCOPY W/ URETERAL STENT PLACEMENT Right 06/30/2016   Procedure: CYSTOSCOPY WITH RETROGRADE PYELOGRAM/URETERAL STENT EXCHANGE;  Surgeon: Alexis Frock, MD;  Location: [facility];  Service: Urology;  Laterality: Right;  . CYSTOSCOPY WITH RETROGRADE PYELOGRAM, URETEROSCOPY AND STENT PLACEMENT Right 03/20/2015   Procedure: CYSTOSCOPY WITH RETROGRADE PYELOGRAM, URETEROSCOPY WITH BALLOON DILATION AND STENT PLACEMENT ON RIGHT;  Surgeon: Alexis Frock, MD;  Location: [facility];  Service: Urology;  Laterality: Right;  . CYSTOSCOPY WITH RETROGRADE PYELOGRAM,  URETEROSCOPY AND STENT PLACEMENT Right 05/02/2015   Procedure: CYSTOSCOPY WITH RIGHT RETROGRADE PYELOGRAM,  DIAGNOSTIC URETEROSCOPY AND STENT PULL ;  Surgeon: Alexis Frock, MD;  Location: [facility];  Service: Urology;  Laterality: Right;  . CYSTOSCOPY WITH RETROGRADE PYELOGRAM, URETEROSCOPY AND STENT PLACEMENT Right 09/05/2015   Procedure: CYSTOSCOPY WITH RETROGRADE PYELOGRAM,  AND STENT PLACEMENT;  Surgeon: Alexis Frock, MD;  [facility];  Service: Urology;  Laterality: Right;  . EUS N/A 10/02/2014   Procedure: LOWER ENDOSCOPIC ULTRASOUND (EUS);  Surgeon: Arta Silence, MD;  Location: [facility];  Service: Endoscopy;  Laterality: N/A;  . EXCISION SOFT TISSUE MASS RIGHT FOREMAN  12-08-2006  . EYE SURGERY  as child   pytosis of eyelids repair  . INCISION AND DRAINAGE OF WOUND Bilateral 12/26/2015   Procedure: DEBRIDEMENT OF BILATERAL MASTECTOMY FLAPS;  Surgeon: Irene Limbo, MD;  Location: [place];  Service: Plastics;  Laterality: Bilateral;  . LAPAROSCOPIC CHOLECYSTECTOMY  1990  . LIPOSUCTION WITH LIPOFILLING Bilateral 04/16/2016   Procedure: LIPOSUCTION WITH LIPOFILLING TO BILATERAL CHEST;  Surgeon: Irene Limbo, MD;  Location: [place];  Service: Plastics;  Laterality: Bilateral;  . MASTECTOMY Right 12/11/2015   PROPHYLACTIC   . MASTECTOMY COMPLETE / SIMPLE W/ SENTINEL NODE BIOPSY Left 12/11/2015   AXILLARY SENTINEL LYMPH NODE BIOPSY   . MASTECTOMY W/ SENTINEL NODE BIOPSY Bilateral 12/11/2015   Procedure: RIGHT PROPHYLACTIC MASTECTOMY, LEFT TOTAL MASTECTOMY WITH LEFT AXILLARY SENTINEL LYMPH NODE BIOPSY;  Surgeon: Stark Klein, MD;  Location: [place];  Service: General;  Laterality: Bilateral;  . OSTOMY N/A 11/19/2014   Procedure: OSTOMY;  Surgeon: Michael [name], MD;  [facility];  Service: General;  Laterality: N/A;  . REMOVAL OF BILATERAL TISSUE EXPANDERS WITH PLACEMENT OF BILATERAL BREAST IMPLANTS Bilateral 04/16/2016    Procedure: REMOVAL OF BILATERAL TISSUE EXPANDERS WITH PLACEMENT OF BILATERAL BREAST IMPLANTS;  Surgeon: Irene Limbo, MD;  Location: [place];  Service: Plastics;  Laterality: Bilateral;  . ROBOTIC ASSISTED LAP VAGINAL HYSTERECTOMY N/A 11/19/2014   Procedure: ROBOTIC LYSIS OF ADHESIONS, CONVERTED TO LAPAROTOMY RADICAL UPPER VAGINECTOMY,LOW ANTERIOR BOWEL RESECTION, COLOSTOMY, BILATERAL URETERAL STENT PLACEMENT AND CYSTONOMY CLOSURE;  Surgeon: Everitt Amber, MD;  [facility];  Service: Gynecology;  Laterality: N/A;  . TISSUE EXPANDER FILLING Bilateral 12/26/2015   Procedure: EXPANSION OF BILATERAL CHEST TISSUE EXPANDERS (60 mL- Right; 75 mL- Left);  Surgeon: Arnoldo Hooker  Iran Planas, MD;  Location: [name];  Service: Plastics;  Laterality: Bilateral;  . TONSILLECTOMY    . TOTAL ABDOMINAL HYSTERECTOMY  March 2006   [facility]   and Bilateral Salpingoophorectomy/  staging for Ovarian cancer/  an    Family History  Problem Relation Age of Onset  . Cancer Mother 8    stomach ca  . Hypertension Mother   . Cancer Father 34    prostate ca  . Diabetes Father   . Heart disease Father     CABG  . Breast cancer Maternal Aunt     dx in her 33s  . Lymphoma Paternal Aunt   . Brain cancer Paternal Grandfather   . Ovarian cancer Other   . Diabetes Sister   . Hypertension Brother y-10  . Heart disease Brother     CABG  . Diabetes Brother     Social History   Social History  . Marital status: Married    Spouse name: N/A  . Number of children: 1  . Years of education: N/A   Occupational History  . retired Therapist, sports from [place] History Main Topics  . Smoking status: Never Smoker  . Smokeless tobacco: Never Used  . Alcohol use Not on file     Comment: rare social  . Drug use: Unknown  . Sexual activity: Not Currently   Other Topics Concern  . Not on file   Social History Narrative   Exercise-- has not gotten back into it since cancer came back   CBC     Component Value Date/Time   WBC 3.2 (L) 04/14/2016 1200   RBC 4.53 04/14/2016 1200   HGB 11.2 (L) 06/30/2016 0837   HGB 10.4 (L) 03/11/2016 1448   HCT 33.0 (L) 06/30/2016 0837   HCT 34.0 (L) 03/11/2016 1448   PLT 274 04/14/2016 1200   PLT 256 03/11/2016 1448   MCV 83.4 04/14/2016 1200   MCV 82.9 03/11/2016 1448   MCH 25.8 (L) 04/14/2016 1200   MCHC 31.0 04/14/2016 1200   RDW 17.2 (H) 04/14/2016 1200   RDW 17.0 (H) 03/11/2016 1448   LYMPHSABS 0.5 (L) 04/14/2016 1200   LYMPHSABS 1.1 03/11/2016 1448   MONOABS 0.7 04/14/2016 1200   MONOABS 0.7 03/11/2016 1448   EOSABS 0.1 04/14/2016 1200   EOSABS 0.1 03/11/2016 1448   BASOSABS 0.0 04/14/2016 1200   BASOSABS 0.0 03/11/2016 1448     Physical Exam: Blood pressure 119/61, pulse 61, temperature 97.4 F (36.3 C), temperature source Oral, resp. rate 17, height '5\' 3"'$  (1.6 m), weight 199 lb 9.6 oz (90.5 kg), SpO2 100 %. General: Well dressed, well nourished in no apparent distress.   HEENT:  Normocephalic and atraumatic, no lesions.  Extraocular muscles intact. Sclerae anicteric. Pupils equal, round, reactive. No mouth sores or ulcers. Thyroid is normal size, not nodular, midline. Skin:  No lesions or rashes. Breasts:  deferred Lungs:  Clear to auscultation bilaterally.  No wheezes. Cardiovascular:  Regular rate and rhythm.  No murmurs or rubs. Abdomen:  Soft, nontender, nondistended.  No palpable masses.  No hepatosplenomegaly.  No ascites. Normal bowel sounds.  No hernias.  Incision is healed. Laparoscopic incision healed. Stomal site healed. Stomal appliance in situ. Genitourinary: vaginal cuff intact without blood or fluid in the vault. Improved length and elasticity. Normal mucosa.No palpable masses in pelvis. Rectal exam: stump in tact, no masses Extremities: No cyanosis, clubbing or edema.  No calf tenderness or erythema. No palpable cords.  Psychiatric: Mood and affect are appropriate. Neurological: Awake, alert and oriented x 3.  Sensation is intact, no neuropathy.  Musculoskeletal: No pain, normal strength and range of motion.  Donaciano Eva, MD  CC: Dr Tresa Moore, Dr Alvy Bimler, Dr Theressa Millard

## 2016-07-05 NOTE — Patient Instructions (Signed)
CT scan of ABD/Pelvis with contrast scheduled for 10/07/2016 at 10am, and follow up appointment with Dr Denman [name] on 10/11/16 @ 1:30 to discuss test results.

## 2016-07-07 DIAGNOSIS — Z933 Colostomy status: Secondary | ICD-10-CM | POA: Diagnosis not present

## 2016-07-28 DIAGNOSIS — Z853 Personal history of malignant neoplasm of breast: Secondary | ICD-10-CM | POA: Diagnosis not present

## 2016-07-28 DIAGNOSIS — N650 Deformity of reconstructed breast: Secondary | ICD-10-CM | POA: Diagnosis not present

## 2016-08-08 IMAGING — CT CT ABD-PELV W/ CM
2 of 5 series · 16 of 46 positions shown, 18 images · IV contrast (OMNIPAQUE)
Comparison: 05/14/2015.

CLINICAL DATA: Endometrial cancer, recurrent, status post surgery
and radiation.

EXAM:
CT ABDOMEN AND PELVIS WITH CONTRAST
TECHNIQUE: Multidetector CT imaging of the abdomen and pelvis was performed
using the standard protocol following bolus administration of
intravenous contrast.
CONTRAST:  100mL OMNIPAQUE IOHEXOL 300 MG/ML  SOLN

[Series 2: rtn a/p with · axial · 0.78mm/px · z∈[+702,+1112]mm · 13 of 94 slices shown, 15 images]
[im 6/94  soft-tissue]
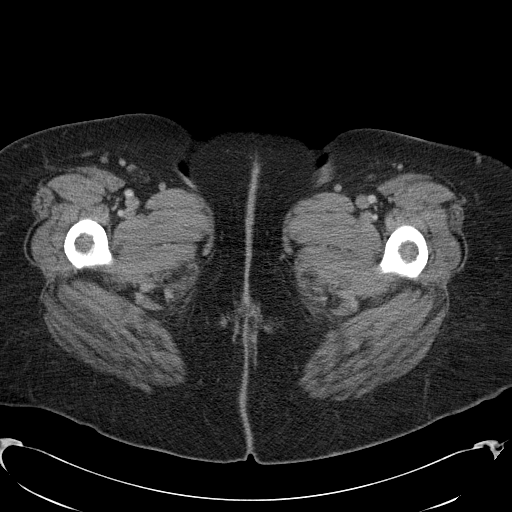
[im 6/94  bone]
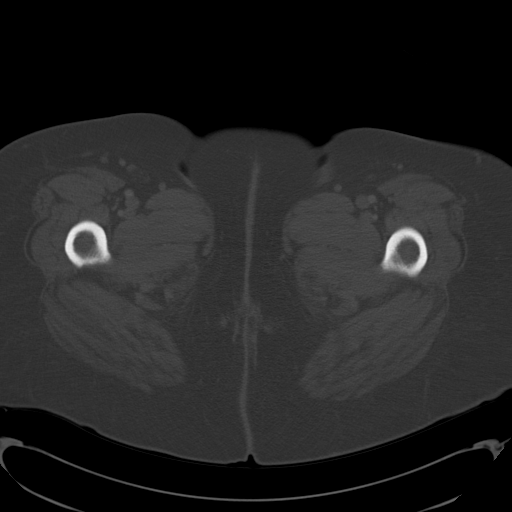
[im 11/94  soft-tissue]
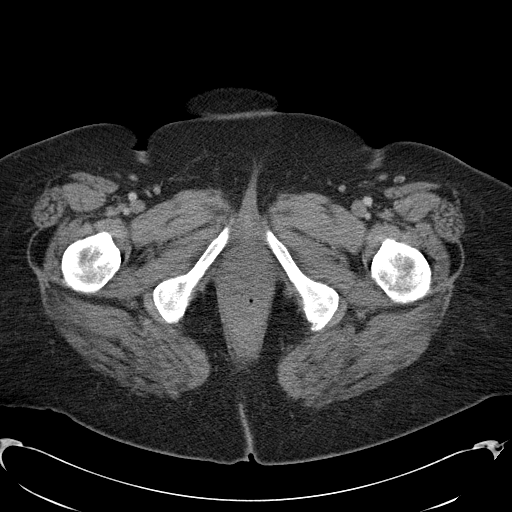
[im 21/94  soft-tissue]
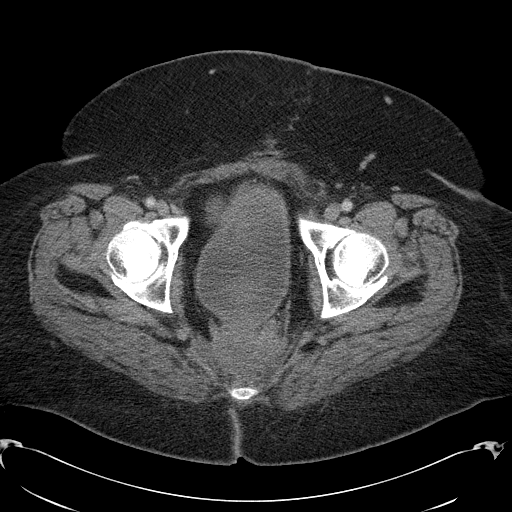
[im 26/94  soft-tissue]
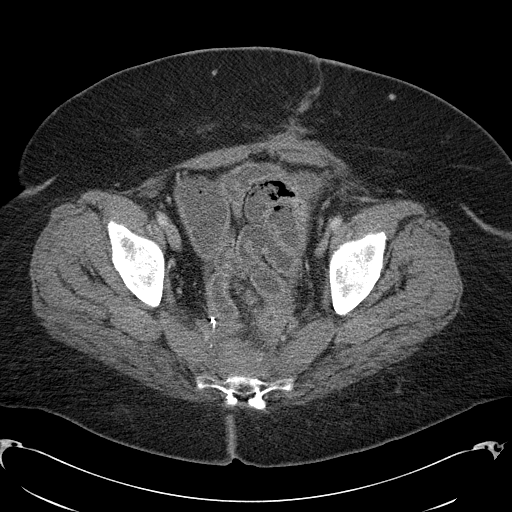
[im 32/94  soft-tissue]
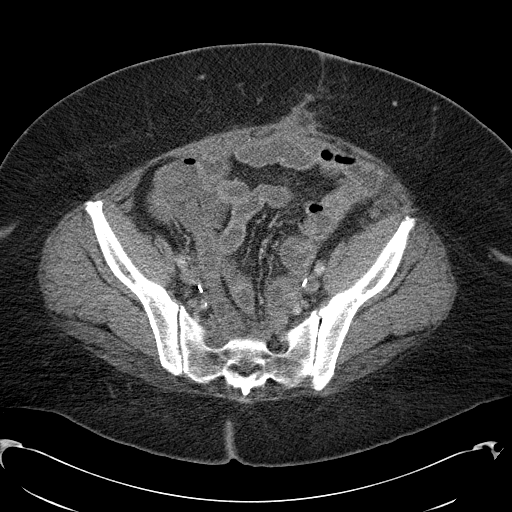
[im 42/94  soft-tissue]
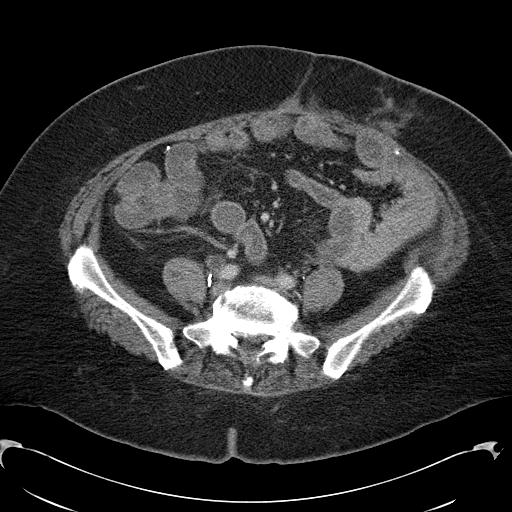
[im 47/94  soft-tissue]
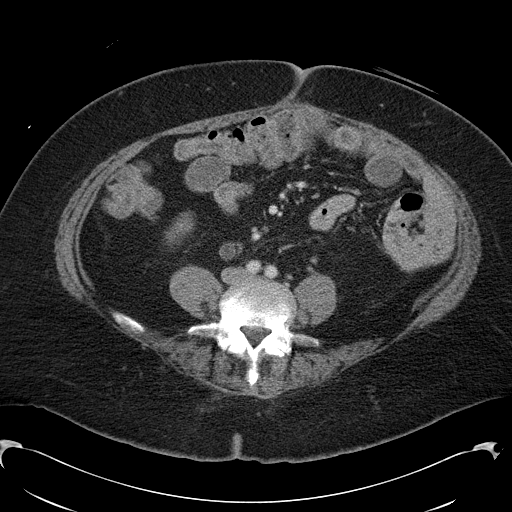
[im 52/94  soft-tissue]
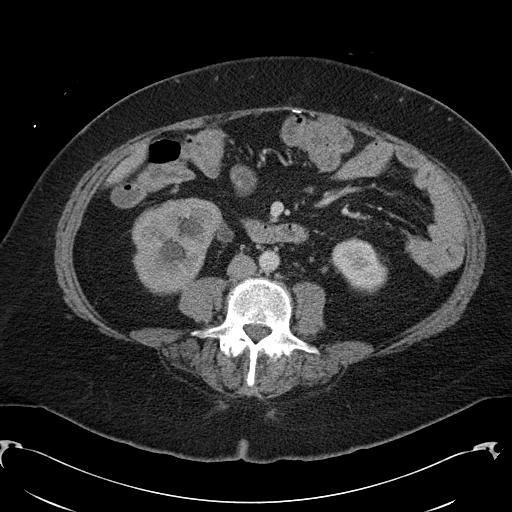
[im 63/94  soft-tissue]
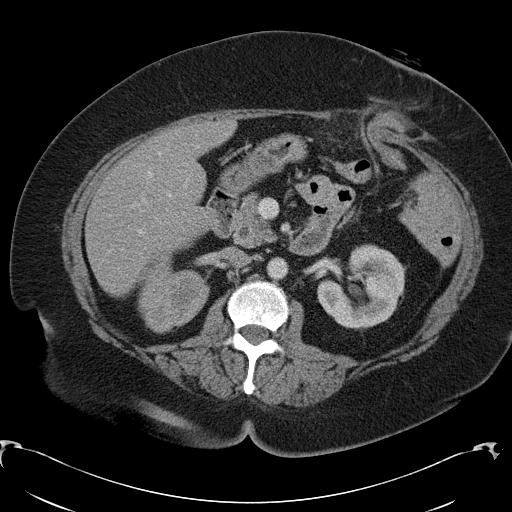
[im 63/94  bone]
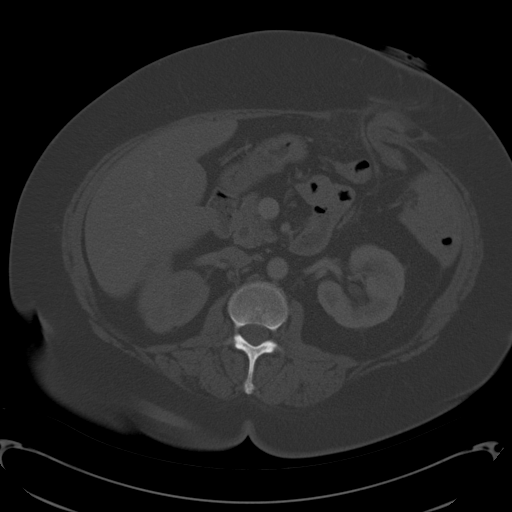
[im 68/94  soft-tissue]
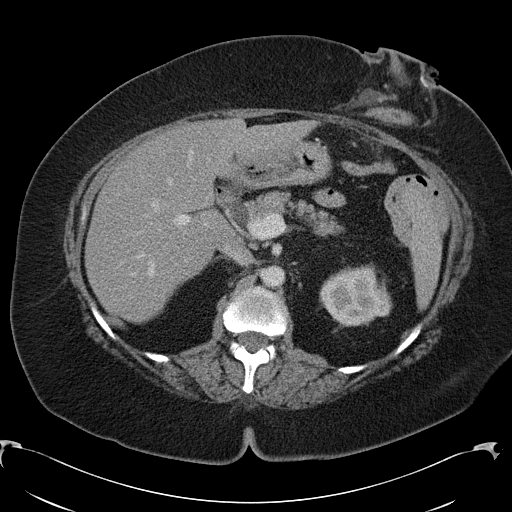
[im 73/94  soft-tissue]
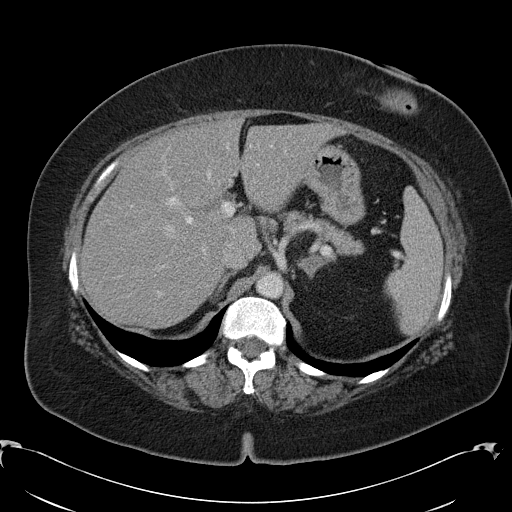
[im 83/94  soft-tissue]
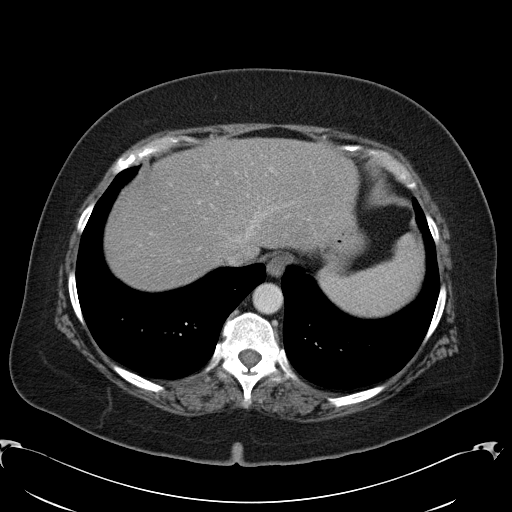
[im 88/94  soft-tissue]
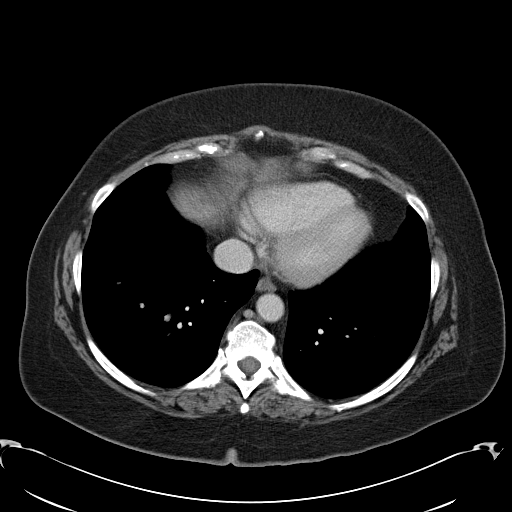

[Series 602: <mpr thick range> · coronal · 0.91mm/px · 3 of 110 slices shown]
[im 37/110  soft-tissue]
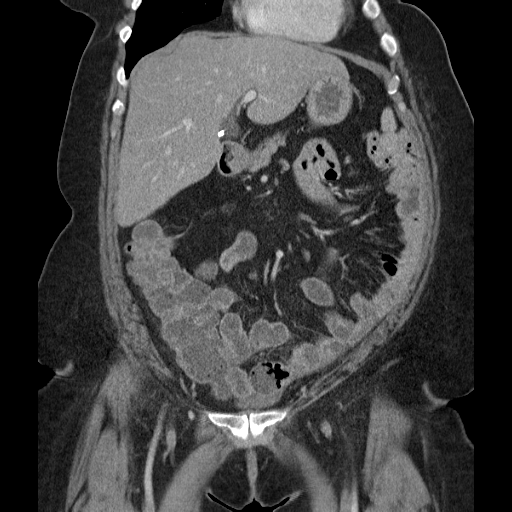
[im 49/110  soft-tissue]
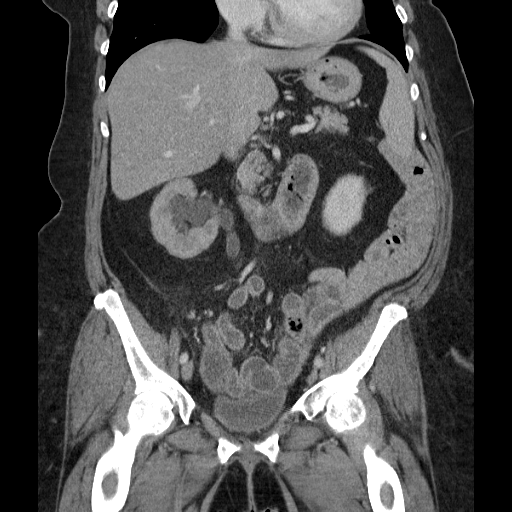
[im 61/110  soft-tissue]
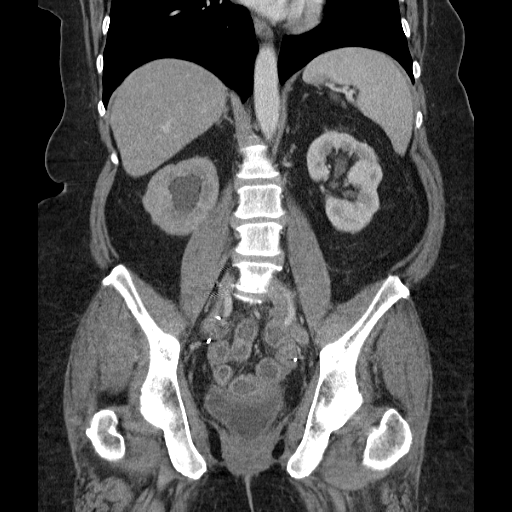

[16 of 46 positions shown; findings below may reference images not displayed]

FINDINGS: Lower chest: Lung bases show no acute findings. Heart size normal.
No pericardial or pleural effusion.

Hepatobiliary: Liver is decreased in attenuation diffusely. Common
bile duct dilatation, stable, likely related to cholecystectomy.

Pancreas: Negative.

Spleen: Negative.

Adrenals/Urinary Tract: Right adrenal gland is unremarkable. Left
adrenal nodule measures 1.6 cm and has a relative washout of 56%.
Decreased attenuation of the right renal parenchyma with severe
right hydronephrosis the level of surgical clips in the right
anatomic pelvis. No excretion of contrast on nephrographic phase
imaging. Left kidney is unremarkable. Left ureter is decompressed.
Bladder is grossly unremarkable.

Stomach/Bowel: Stomach and small bowel are unremarkable. Left lower
quadrant colostomy.

Vascular/Lymphatic: Vascular structures are unremarkable. No
pathologically enlarged lymph nodes.

Reproductive: Hysterectomy.  Ovaries are not well-visualized.

Other: Prevascular soft tissue thickening, grossly stable.
Postoperative changes along the ventral abdominal wall. No free
fluid. Mesenteries and peritoneum are otherwise unremarkable.

Musculoskeletal: No worrisome lytic or sclerotic lesions.
IMPRESSION: 1. Hysterectomy with stable presacral soft tissue thickening. No
definitive evidence of recurrent or metastatic disease.
2. Severe right hydronephrosis, to the level of surgical clips in
the right anatomic pelvis, as before, with evidence of decreased
renal function.
3. Liver appears fatty.
4. Left adrenal adenoma.

## 2016-08-09 DIAGNOSIS — C50212 Malignant neoplasm of upper-inner quadrant of left female breast: Secondary | ICD-10-CM | POA: Diagnosis not present

## 2016-08-09 DIAGNOSIS — Z8542 Personal history of malignant neoplasm of other parts of uterus: Secondary | ICD-10-CM | POA: Diagnosis not present

## 2016-08-25 DIAGNOSIS — E119 Type 2 diabetes mellitus without complications: Secondary | ICD-10-CM | POA: Diagnosis not present

## 2016-08-25 DIAGNOSIS — E559 Vitamin D deficiency, unspecified: Secondary | ICD-10-CM | POA: Diagnosis not present

## 2016-08-25 DIAGNOSIS — E039 Hypothyroidism, unspecified: Secondary | ICD-10-CM | POA: Diagnosis not present

## 2016-08-25 DIAGNOSIS — E782 Mixed hyperlipidemia: Secondary | ICD-10-CM | POA: Diagnosis not present

## 2016-08-30 ENCOUNTER — Encounter: Payer: Self-pay | Admitting: Family Medicine

## 2016-08-30 ENCOUNTER — Ambulatory Visit (INDEPENDENT_AMBULATORY_CARE_PROVIDER_SITE_OTHER): Payer: BLUE CROSS/BLUE SHIELD | Admitting: Family Medicine

## 2016-08-30 VITALS — BP 130/70 | HR 73 | Temp 98.2°F | Resp 16 | Ht 62.2 in | Wt 205.0 lb

## 2016-08-30 DIAGNOSIS — Z933 Colostomy status: Secondary | ICD-10-CM | POA: Diagnosis not present

## 2016-08-30 DIAGNOSIS — E1129 Type 2 diabetes mellitus with other diabetic kidney complication: Secondary | ICD-10-CM | POA: Diagnosis not present

## 2016-08-30 DIAGNOSIS — E782 Mixed hyperlipidemia: Secondary | ICD-10-CM | POA: Diagnosis not present

## 2016-08-30 DIAGNOSIS — Z23 Encounter for immunization: Secondary | ICD-10-CM

## 2016-08-30 DIAGNOSIS — E785 Hyperlipidemia, unspecified: Secondary | ICD-10-CM | POA: Diagnosis not present

## 2016-08-30 DIAGNOSIS — Z0000 Encounter for general adult medical examination without abnormal findings: Secondary | ICD-10-CM | POA: Diagnosis not present

## 2016-08-30 DIAGNOSIS — R8290 Unspecified abnormal findings in urine: Secondary | ICD-10-CM | POA: Diagnosis not present

## 2016-08-30 DIAGNOSIS — I10 Essential (primary) hypertension: Secondary | ICD-10-CM | POA: Diagnosis not present

## 2016-08-30 DIAGNOSIS — Z853 Personal history of malignant neoplasm of breast: Secondary | ICD-10-CM | POA: Diagnosis not present

## 2016-08-30 DIAGNOSIS — E559 Vitamin D deficiency, unspecified: Secondary | ICD-10-CM | POA: Diagnosis present

## 2016-08-30 DIAGNOSIS — D709 Neutropenia, unspecified: Secondary | ICD-10-CM | POA: Diagnosis not present

## 2016-08-30 DIAGNOSIS — E039 Hypothyroidism, unspecified: Secondary | ICD-10-CM | POA: Diagnosis not present

## 2016-08-30 DIAGNOSIS — E119 Type 2 diabetes mellitus without complications: Secondary | ICD-10-CM | POA: Diagnosis not present

## 2016-08-30 LAB — POC URINALSYSI DIPSTICK (AUTOMATED)
Bilirubin, UA: NEGATIVE
Blood, UA: NEGATIVE
KETONES UA: NEGATIVE
Nitrite, UA: NEGATIVE
Spec Grav, UA: 1.015
Urobilinogen, UA: NEGATIVE
pH, UA: 6

## 2016-08-30 LAB — MICROALBUMIN / CREATININE URINE RATIO
Creatinine,U: 64.1 mg/dL
MICROALB UR: 5.6 mg/dL — AB (ref 0.0–1.9)
MICROALB/CREAT RATIO: 8.7 mg/g (ref 0.0–30.0)

## 2016-08-30 NOTE — Progress Notes (Signed)
Pre visit review using our clinic review tool, if applicable. No additional management support is needed unless otherwise documented below in the visit note. Subjective:     [name] is a 61 y.o. female and is here for a comprehensive physical exam. The patient reports no problems.  Social History   Social History  . Marital status: Married    Spouse name: N/A  . Number of children: 1  . Years of education: N/A   Occupational History  . retired Therapist, sports from [place] History Main Topics  . Smoking status: Never Smoker  . Smokeless tobacco: Never Used  . Alcohol use Not on file     Comment: rare social  . Drug use: Unknown  . Sexual activity: Not Currently   Other Topics Concern  . Not on file   Social History Narrative   Exercise-- has not gotten back into it since cancer came back   Health Maintenance  Topic Date Due  . INFLUENZA VACCINE  03/02/2016  . FOOT EXAM  08/27/2016  . OPHTHALMOLOGY EXAM  09/24/2016  . HEMOGLOBIN A1C  11/04/2016  . PNEUMOCOCCAL POLYSACCHARIDE VACCINE (2) 06/15/2017  . MAMMOGRAM  10/29/2017  . COLONOSCOPY  08/22/2023  . TETANUS/TDAP  08/27/2025  . Hepatitis C Screening  Completed  . HIV Screening  Completed  . PAP SMEAR  Excluded    The following portions of the patient's history were reviewed and updated as appropriate:  She  has a past medical history of Anemia in neoplastic disease; Benign essential HTN; Breast cancer, left ([place]) (dx 10-30-2015  oncologist-  dr Heath Lark); Cancer of corpus uteri, except isthmus [facility]) (dx 10-15-2004 oncologist-- dr Denman [name] and dr Alvy Bimler ); Chronic idiopathic neutropenia ([place]); Complication of anesthesia; Diabetic retinopathy, background ([place]); DM type 2 (diabetes mellitus, type 2) ([place]); Dysuria; GERD (gastroesophageal reflux disease); Hiatal hernia; History of bronchitis; History of gastric polyp; History of radiation therapy; Hypothyroidism; Mixed dyslipidemia; Multiple thyroid nodules; PONV  (postoperative nausea and vomiting); Radiation-induced dermatitis; Right flank pain; S/P colostomy ([place]); Seasonal allergies; Shoulder stiffness; Ureteral stricture, right (UROLOGIT-  DR [facility]); Vitamin D deficiency; and Wears glasses. She  does not have any pertinent problems on file. She  has a past surgical history that includes Appendectomy; Tonsillectomy; Colonoscopy with propofol (N/A, 08/21/2013); EUS (N/A, 10/02/2014); Robotic assisted lap vaginal hysterectomy (N/A, 11/19/2014); Ostomy (N/A, 11/19/2014); Colostomy takedown (N/A, 12/04/2014); Cystoscopy with retrograde pyelogram, ureteroscopy and stent placement (Right, 03/20/2015); EXCISION SOFT TISSUE MASS RIGHT FOREMAN (12-08-2006); Cystoscopy with retrograde pyelogram, ureteroscopy and stent placement (Right, 05/02/2015); Cystoscopy with retrograde pyelogram, ureteroscopy and stent placement (Right, 09/05/2015); Cystoscopy w/ retrogrades (Right, 11/21/2015); Cystoscopy w/ ureteral stent placement (Right, 11/21/2015); Mastectomy (Right, 12/11/2015); Mastectomy complete / simple w/ sentinel node biopsy (Left, 12/11/2015); Mastectomy w/ sentinel node biopsy (Bilateral, 12/11/2015); Breast reconstruction with placement of tissue expander and flex hd (acellular hydrated dermis) (Bilateral, 12/11/2015); Incision and drainage of wound (Bilateral, 12/26/2015); Tissue expander filling (Bilateral, 12/26/2015); Laparoscopic cholecystectomy (1990); Eye surgery (as child); Total abdominal hysterectomy (March 2006   [facility]); Cystoscopy w/ ureteral stent placement (Right, 03/10/2016); Removal of bilateral tissue expanders with placement of bilateral breast implants (Bilateral, 04/16/2016); Liposuction with lipofilling (Bilateral, 04/16/2016); and Cystoscopy w/ ureteral stent placement (Right, 06/30/2016). Her family history includes Brain cancer in her paternal grandfather; Breast cancer in her maternal aunt; Cancer (age of onset: 54) in her mother; Cancer (age of onset: 89) in her  father; Diabetes in her brother, father, and sister; Heart disease in her brother  and father; Hypertension in her mother; Hypertension (age of onset: y-10) in her brother; Lymphoma in her paternal aunt; Ovarian cancer in her other. She  reports that she has never smoked. She has never used smokeless tobacco. Her alcohol and drug histories are not on file. She has a current medication list which includes the following prescription(s): anastrozole, biotin, calcium-magnesium-vitamin d, canagliflozin, vitamin d3, diphenhydramine, filgrastim, levothyroxine, loratadine, metformin, omega-3 acid ethyl esters, omeprazole, polyethyl glycol-propyl glycol, prenatal vit-fe fumarate-fa, rosuvastatin, sitagliptin, and valsartan. Current Outpatient Prescriptions on File Prior to Visit  Medication Sig Dispense Refill  . anastrozole (ARIMIDEX) 1 MG tablet Take 1 tablet (1 mg total) by mouth daily. (Patient taking differently: Take 1 mg by mouth every morning. ) 90 tablet 3  . Biotin 5 MG TABS Take 5 mg by mouth every morning.     . Calcium-Magnesium-Vitamin D 400-166.7-133.3 MG-MG-UNIT TABS Take 2 tablets by mouth daily.    . Canagliflozin (INVOKANA) 300 MG TABS Take 300 mg by mouth every morning.     . Cholecalciferol (VITAMIN D3) 10000 UNITS capsule Take 10,000 Units by mouth once a week. Sunday evening's    . diphenhydrAMINE (BENADRYL) 25 MG tablet Take 25 mg by mouth every 4 (four) hours as needed for itching, allergies or sleep. Reported on 09/15/2015    . filgrastim (NEUPOGEN) 480 MCG/1.6ML injection Inject 1.6 ml under the skin every 3 days for life 16 mL 11  . loratadine (CLARITIN) 10 MG tablet Take 10 mg by mouth every morning.     . metFORMIN (GLUCOPHAGE) 1000 MG tablet Take 1,000 mg by mouth 2 (two) times daily with a meal.     . omega-3 acid ethyl esters (LOVAZA) 1 G capsule Take 1 g by mouth 2 (two) times daily.    [name] Kitchen omeprazole (PRILOSEC) 20 MG capsule Take 20 mg by mouth every other day. Other other night     . Polyethyl Glycol-Propyl Glycol (SYSTANE OP) Place 1 drop into both eyes at bedtime.     . rosuvastatin (CRESTOR) 10 MG tablet Take 10 mg by mouth every evening.     . sitaGLIPtin (JANUVIA) 100 MG tablet Take 100 mg by mouth every morning.     . valsartan (DIOVAN) 80 MG tablet Take 1 tablet (80 mg total) by mouth daily. (Patient taking differently: Take 80 mg by mouth every morning. )     No current facility-administered medications on file prior to visit.    She is allergic to penicillins; ultram [tramadol]; adhesive [tape]; cefaclor; erythromycin; trimethoprim; pectin; and sulfa antibiotics..  Review of Systems Review of Systems  Constitutional: Negative for activity change, appetite change and fatigue.  HENT: Negative for hearing loss, congestion, tinnitus and ear discharge.  dentist q33m Eyes: Negative for visual disturbance (see optho q1y -- vision corrected to 20/20 with glasses).  Respiratory: Negative for cough, chest tightness and shortness of breath.   Cardiovascular: Negative for chest pain, palpitations and leg swelling.  Gastrointestinal: Negative for abdominal pain, diarrhea, constipation and abdominal distention.  Genitourinary: Negative for urgency, frequency, decreased urine volume and difficulty urinating.  Musculoskeletal: Negative for back pain, arthralgias and gait problem.  Skin: Negative for color change, pallor and rash.  Neurological: Negative for dizziness, light-headedness, numbness and headaches.  Hematological: Negative for adenopathy. Does not bruise/bleed easily.  Psychiatric/Behavioral: Negative for suicidal ideas, confusion, sleep disturbance, self-injury, dysphoric mood, decreased concentration and agitation.       Objective:    BP 130/70 (BP Location: Right Arm, Cuff Size: Normal)  Pulse 73   Temp 98.2 F (36.8 C) (Oral)   Resp 16   Ht 5' 2.2" (1.58 m)   Wt 205 lb (93 kg)   SpO2 98%   BMI 37.25 kg/m  General appearance: alert,  cooperative, appears stated age and no distress Head: Normocephalic, without obvious abnormality, atraumatic Eyes: conjunctivae/corneas clear. PERRL, EOM's intact. Fundi benign. Ears: normal TM's and external ear canals both ears Nose: Nares normal. Septum midline. Mucosa normal. No drainage or sinus tenderness. Throat: lips, mucosa, and tongue normal; teeth and gums normal Neck: no adenopathy, no carotid bruit, no JVD, supple, symmetrical, trachea midline and thyroid not enlarged, symmetric, no tenderness/mass/nodules Back: symmetric, no curvature. ROM normal. No CVA tenderness. Lungs: clear to auscultation bilaterally Breasts: normal appearance, no masses or tenderness Heart: regular rate and rhythm, S1, S2 normal, no murmur, click, rub or gallop Abdomen: soft, non-tender; bowel sounds normal; no masses,  no organomegaly Pelvic: not indicated; status post hysterectomy, negative ROS Extremities: extremities normal, atraumatic, no cyanosis or edema Pulses: 2+ and symmetric Skin: Skin color, texture, turgor normal. No rashes or lesions Lymph nodes: Cervical, supraclavicular, and axillary nodes normal. Neurologic: Alert and oriented X 3, normal strength and tone. Normal symmetric reflexes. Normal coordination and gait    Assessment:    Healthy female exam.    Plan:    ghm utd Check labs  See After Visit Summary for Counseling Recommendations    1. Preventative health care See above - POCT Urinalysis Dipstick (Automated)  2. Controlled type 2 diabetes mellitus with other diabetic kidney complication, without long-term current use of insulin ([place]) Per endo - Microalbumin / creatinine urine ratio  3. Hyperlipidemia LDL goal <70 meds and labs per endo  4. Abnormal urine   - Urine Culture  5. Need for 23-polyvalent pneumococcal polysaccharide vaccine   - Pneumococcal polysaccharide vaccine 23-valent greater than or equal to 2yo subcutaneous/IM

## 2016-08-30 NOTE — Patient Instructions (Signed)
Preventive Care 40-64 Years, Female Preventive care refers to lifestyle choices and visits with your health care provider that can promote health and wellness. What does preventive care include?  A yearly physical exam. This is also called an annual well check.  Dental exams once or twice a year.  Routine eye exams. Ask your health care provider how often you should have your eyes checked.  Personal lifestyle choices, including:  Daily care of your teeth and gums.  Regular physical activity.  Eating a healthy diet.  Avoiding tobacco and drug use.  Limiting alcohol use.  Practicing safe sex.  Taking low-dose aspirin daily starting at age 50.  Taking vitamin and mineral supplements as recommended by your health care provider. What happens during an annual well check? The services and screenings done by your health care provider during your annual well check will depend on your age, overall health, lifestyle risk factors, and family history of disease. Counseling  Your health care provider may ask you questions about your:  Alcohol use.  Tobacco use.  Drug use.  Emotional well-being.  Home and relationship well-being.  Sexual activity.  Eating habits.  Work and work environment.  Method of birth control.  Menstrual cycle.  Pregnancy history. Screening  You may have the following tests or measurements:  Height, weight, and BMI.  Blood pressure.  Lipid and cholesterol levels. These may be checked every 5 years, or more frequently if you are over 50 years old.  Skin check.  Lung cancer screening. You may have this screening every year starting at age 55 if you have a 30-pack-year history of smoking and currently smoke or have quit within the past 15 years.  Fecal occult blood test (FOBT) of the stool. You may have this test every year starting at age 50.  Flexible sigmoidoscopy or colonoscopy. You may have a sigmoidoscopy every 5 years or a colonoscopy  every 10 years starting at age 50.  Hepatitis C blood test.  Hepatitis B blood test.  Sexually transmitted disease (STD) testing.  Diabetes screening. This is done by checking your blood sugar (glucose) after you have not eaten for a while (fasting). You may have this done every 1-3 years.  Mammogram. This may be done every 1-2 years. Talk to your health care provider about when you should start having regular mammograms. This may depend on whether you have a family history of breast cancer.  BRCA-related cancer screening. This may be done if you have a family history of breast, ovarian, tubal, or peritoneal cancers.  Pelvic exam and Pap test. This may be done every 3 years starting at age 21. Starting at age 30, this may be done every 5 years if you have a Pap test in combination with an HPV test.  Bone density scan. This is done to screen for osteoporosis. You may have this scan if you are at high risk for osteoporosis. Discuss your test results, treatment options, and if necessary, the need for more tests with your health care provider. Vaccines  Your health care provider may recommend certain vaccines, such as:  Influenza vaccine. This is recommended every year.  Tetanus, diphtheria, and acellular pertussis (Tdap, Td) vaccine. You may need a Td booster every 10 years.  Varicella vaccine. You may need this if you have not been vaccinated.  Zoster vaccine. You may need this after age 60.  Measles, mumps, and rubella (MMR) vaccine. You may need at least one dose of MMR if you were born   in 1957 or later. You may also need a second dose.  Pneumococcal 13-valent conjugate (PCV13) vaccine. You may need this if you have certain conditions and were not previously vaccinated.  Pneumococcal polysaccharide (PPSV23) vaccine. You may need one or two doses if you smoke cigarettes or if you have certain conditions.  Meningococcal vaccine. You may need this if you have certain  conditions.  Hepatitis A vaccine. You may need this if you have certain conditions or if you travel or work in places where you may be exposed to hepatitis A.  Hepatitis B vaccine. You may need this if you have certain conditions or if you travel or work in places where you may be exposed to hepatitis B.  Haemophilus influenzae type b (Hib) vaccine. You may need this if you have certain conditions. Talk to your health care provider about which screenings and vaccines you need and how often you need them. This information is not intended to replace advice given to you by your health care provider. Make sure you discuss any questions you have with your health care provider. Document Released: 08/15/2015 Document Revised: 04/07/2016 Document Reviewed: 05/20/2015 Elsevier Interactive Patient Education  2017 [employer].

## 2016-09-01 LAB — URINE CULTURE

## 2016-09-06 ENCOUNTER — Other Ambulatory Visit: Payer: Self-pay | Admitting: Family Medicine

## 2016-09-06 DIAGNOSIS — R809 Proteinuria, unspecified: Secondary | ICD-10-CM

## 2016-09-07 ENCOUNTER — Other Ambulatory Visit: Payer: BLUE CROSS/BLUE SHIELD

## 2016-09-08 ENCOUNTER — Other Ambulatory Visit: Payer: BLUE CROSS/BLUE SHIELD

## 2016-09-09 ENCOUNTER — Other Ambulatory Visit: Payer: BLUE CROSS/BLUE SHIELD

## 2016-09-09 DIAGNOSIS — R809 Proteinuria, unspecified: Secondary | ICD-10-CM | POA: Diagnosis not present

## 2016-09-10 ENCOUNTER — Telehealth: Payer: Self-pay | Admitting: Hematology and Oncology

## 2016-09-10 ENCOUNTER — Ambulatory Visit (HOSPITAL_BASED_OUTPATIENT_CLINIC_OR_DEPARTMENT_OTHER): Payer: BLUE CROSS/BLUE SHIELD | Admitting: Hematology and Oncology

## 2016-09-10 ENCOUNTER — Ambulatory Visit (HOSPITAL_BASED_OUTPATIENT_CLINIC_OR_DEPARTMENT_OTHER): Payer: BLUE CROSS/BLUE SHIELD

## 2016-09-10 ENCOUNTER — Encounter: Payer: Self-pay | Admitting: Hematology and Oncology

## 2016-09-10 DIAGNOSIS — N135 Crossing vessel and stricture of ureter without hydronephrosis: Secondary | ICD-10-CM | POA: Diagnosis not present

## 2016-09-10 DIAGNOSIS — D709 Neutropenia, unspecified: Secondary | ICD-10-CM

## 2016-09-10 DIAGNOSIS — Z170 Estrogen receptor positive status [ER+]: Secondary | ICD-10-CM

## 2016-09-10 DIAGNOSIS — Z8543 Personal history of malignant neoplasm of ovary: Secondary | ICD-10-CM

## 2016-09-10 DIAGNOSIS — Z8542 Personal history of malignant neoplasm of other parts of uterus: Secondary | ICD-10-CM

## 2016-09-10 DIAGNOSIS — C50212 Malignant neoplasm of upper-inner quadrant of left female breast: Secondary | ICD-10-CM

## 2016-09-10 LAB — CBC WITH DIFFERENTIAL/PLATELET
BASO%: 1.7 % (ref 0.0–2.0)
Basophils Absolute: 0 10*3/uL (ref 0.0–0.1)
EOS%: 6.4 % (ref 0.0–7.0)
Eosinophils Absolute: 0.2 10*3/uL (ref 0.0–0.5)
HCT: 37.3 % (ref 34.8–46.6)
HGB: 11.7 g/dL (ref 11.6–15.9)
LYMPH%: 35.2 % (ref 14.0–49.7)
MCH: 26.4 pg (ref 25.1–34.0)
MCHC: 31.4 g/dL — ABNORMAL LOW (ref 31.5–36.0)
MCV: 84.2 fL (ref 79.5–101.0)
MONO#: 0.7 10*3/uL (ref 0.1–0.9)
MONO%: 31.3 % — AB (ref 0.0–14.0)
NEUT%: 25.4 % — ABNORMAL LOW (ref 38.4–76.8)
NEUTROS ABS: 0.6 10*3/uL — AB (ref 1.5–6.5)
Platelets: 226 10*3/uL (ref 145–400)
RBC: 4.43 10*6/uL (ref 3.70–5.45)
RDW: 16.4 % — ABNORMAL HIGH (ref 11.2–14.5)
WBC: 2.3 10*3/uL — AB (ref 3.9–10.3)
lymph#: 0.8 10*3/uL — ABNORMAL LOW (ref 0.9–3.3)

## 2016-09-10 LAB — PROTEIN, URINE, 24 HOUR
PROTEIN 24H UR: 920 mg/(24.h) — AB (ref <150)
PROTEIN, URINE: 40 mg/dL — AB (ref 5–24)

## 2016-09-10 MED ORDER — FILGRASTIM 480 MCG/1.6ML IJ SOLN: INTRAMUSCULAR | 11 refills | Status: DC

## 2016-09-10 NOTE — Assessment & Plan Note (Signed)
Her CBC today is low, likely related to transient worsening bone marrow suppresion from recent infection I recommend more frequent injection to every 5 days. I refill her prescription of GCSF. For future surgery, due to her history of recurrent infection, I recommend we titrate the dosage of Neupogen to keep a consistently greater than 1.5 in the perioperative setting.

## 2016-09-10 NOTE — Progress Notes (Signed)
[facility] OFFICE PROGRESS NOTE  Patient Care Team: Donato Schultz, DO as PCP - General (Family Medicine) Artis Delay, MD as Consulting Physician (Hematology and Oncology) Adolphus Birchwood, MD as Consulting Physician (Obstetrics and Gynecology) Karie Soda, MD as Consulting Physician (General Surgery) Sebastian Ache, MD as Consulting Physician (Urology) Hali Marry, MD as Consulting Physician (Endocrinology) Antony Contras, MD as Consulting Physician (Ophthalmology) Almond Lint, MD as Consulting Physician (General Surgery) Glenna Fellows, MD as Consulting Physician (Plastic Surgery)  SUMMARY OF ONCOLOGIC HISTORY: Oncology History   Breast cancer of upper-inner quadrant of left female breast [facility])   Staging form: Breast, AJCC 7th Edition     Clinical stage from 12/31/2015: Stage IIA (T2, N0, M0) - Signed by Artis Delay, MD on 12/31/2015  ER,PR positive Her2 neg     History of ovarian & endometrial cancer   10/15/2004 Initial Diagnosis    History of ovarian cancer, treated with chemotherapy carbo/Taxol      03/17/2009 Bone Marrow Biopsy    Bone marrow biopsy at [facility] showed neutropenia      01/23/2014 Bone Marrow Biopsy    Repeat bone marrow biopsy showed neutropenia      09/12/2014 Imaging    CT scan showed possible cancer      10/14/2014 Imaging    MRI show significant pelvic mass without invasion into the bladder but abutting to the rectum      11/19/2014 Surgery    she underwent surgery and had robotic-assisted lysis of adhesions, converted to laparotomy, radical upper vaginectomy and low anterior resection with colostomy. 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 Resection margins were negative      01/29/2015 - 03/10/2015 Radiation Therapy    She  received adjuvant radiation      03/03/2015 Imaging    CT scan of the abdomen and pelvis showed status post interval removal of the bilateral nephroureteral stents. Worsening moderate right hydroureteronephrosis. Resolved left hydroureteronephrosis.      03/20/2015 Procedure    she had cystoscopy and stent placement for right hydronephrosis      05/02/2015 Surgery    Cystoscopy with right retrograde pyelogram interpretation. Diagnostic ureteroscopy. Removal of right ureteral stent.      05/14/2015 Imaging    CT scan of the abdomen and pelvis show unilateral right hydronephrosis with no residual cancer      08/18/2015 Imaging    CT scan showed hysterectomy with stable presacral soft tissue thickening. No definitive evidence of recurrent or metastatic disease. Severe right hydronephrosis      09/12/2015 Surgery    Cystoscopy with right retrograde pyelogram interpretation. Right ureteral stent placement 5 x 24 Polaris, no tether       Breast cancer of upper-inner quadrant of left female breast (HCC)   10/14/2015 Imaging    Screening mammogram showed possible distortion in the left breast.      10/24/2015 Imaging    Targeted ultrasound is performed, showing a 0.6 x 0.8 x 0.9 cm area of hypoechoic distortion at the 10 o'clock position of the left breast 5 cm from the nipple      10/24/2015 Imaging    Diagnostic imaging confirmed 0.6 x 0.8 x 0.9 cm distortion in the upper inner left breast      10/30/2015 Procedure    Left US guided biopsy  was performed      10/30/2015 Pathology Results    Accession: [id number] showed invasive ductal carcinoma, ER/PR positive, Her2 neg      11/05/2015 Genetic Testing    Genetic testing did not reveal a deleterious mutation.  Genetic testing did detect a Variant of Unknown Significance in the MSH6 gene called [id number]..  Genes tested include: APC, ATM, AXIN2, BARD1, BMPR1A, BRCA1, BRCA2, BRIP1, CDH1, CDKN2A, CHEK2, DICER1, EPCAM, GREM1, KIT, MEN1, MLH1,  MSH2, MSH6, MUTYH, NBN, NF1, PALB2, PDGFRA, PMS2, POLD1, POLE, PTEN, RAD50, RAD51C, RAD51D, SDHA, SDHB, SDHC, SDHD, SMAD4, SMARCA4. STK11, TP53, TSC1, TSC2, and VHL.      12/11/2015 Surgery    She undwerwent bilateral mastectomy, left SLN biopsy and immediate reconstruction surgeries with bilateral plasma expanders      12/11/2015 Pathology Results    Accession: [id number] mastectomy specimens showed left breast ca, pT2N0M0      12/26/2015 Pathology Results    Accession: [id number] specimen from debridement showed no cancer      12/26/2015 Surgery    she underwent surgical debridement of necrotic tissue at site of plasma expander      01/12/2016 Imaging    Bone density is normal      02/02/2016 -  Anti-estrogen oral therapy    She started taking Arimidex       INTERVAL HISTORY: Please see below for problem oriented charting. She returns for follow-up related to her chronic neutropenia, recent breast cancer on anti-estrogen therapy and Hx endometrial & ovarian cancer She thought she might have recent UTI, resolved with oral antibiotics She has intermittent right flank discomfort related to stent placement She denies any recent abnormal breast examination, palpable mass, abnormal breast appearance or nipple changes She complained of mild mood changes, alopecia and myalgias related to Arimidex  REVIEW OF SYSTEMS:   Constitutional: Denies fevers, chills or abnormal weight loss Eyes: Denies blurriness of vision Ears, nose, mouth, throat, and face: Denies mucositis or sore throat Respiratory: Denies cough, dyspnea or wheezes Cardiovascular: Denies palpitation, chest discomfort or lower extremity swelling Gastrointestinal:  Denies nausea, heartburn or change in bowel habits Skin: Denies abnormal skin rashes Lymphatics: Denies new lymphadenopathy or easy bruising Neurological:Denies numbness, tingling or new weaknesses Behavioral/Psych: Mood is stable, no new changes  All other  systems were reviewed with the patient and are negative.  I have reviewed the past medical history, past surgical history, social history and family history with the patient and they are unchanged from previous note.  ALLERGIES:  is allergic to penicillins; ultram [tramadol]; adhesive [tape]; cefaclor; erythromycin; trimethoprim; pectin; and sulfa antibiotics.  MEDICATIONS:  Current Outpatient Prescriptions  Medication Sig Dispense Refill  . anastrozole (ARIMIDEX) 1 MG tablet Take 1 tablet (1 mg total) by mouth daily. (Patient taking differently: Take 1 mg by mouth every morning. ) 90 tablet 3  . Biotin 5 MG TABS Take 5 mg by mouth every morning.     . Calcium-Magnesium-Vitamin D 400-166.7-133.3 MG-MG-UNIT TABS Take 2 tablets by mouth daily.    . Canagliflozin (INVOKANA) 300 MG TABS Take 300 mg by mouth every morning.     . Cholecalciferol (VITAMIN D3) 10000 UNITS capsule Take 10,000 Units by mouth once a week. Sunday evening's    . diphenhydrAMINE (BENADRYL) 25 MG tablet Take 25 mg by mouth every 4 (four) hours as needed for itching, allergies or sleep. Reported on 09/15/2015    . filgrastim (NEUPOGEN) 480 MCG/1.6ML injection Inject 1.6 ml under the skin every 5 days for life  16 mL 11  . levothyroxine (SYNTHROID, LEVOTHROID) 150 MCG tablet Take 150 mcg by mouth daily.    [name] Kitchen loratadine (CLARITIN) 10 MG tablet Take 10 mg by mouth every morning.     . metFORMIN (GLUCOPHAGE) 1000 MG tablet Take 1,000 mg by mouth 2 (two) times daily with a meal.     . omega-3 acid ethyl esters (LOVAZA) 1 G capsule Take 1 g by mouth 2 (two) times daily.    [name] Kitchen omeprazole (PRILOSEC) 20 MG capsule Take 20 mg by mouth every other day. Other other night    . Polyethyl Glycol-Propyl Glycol (SYSTANE OP) Place 1 drop into both eyes at bedtime.     . Prenatal Vit-Fe Fumarate-FA (PRENATAL VITAMIN PO) Take 1 capsule by mouth daily.    . rosuvastatin (CRESTOR) 10 MG tablet Take 10 mg by mouth every evening.     . sitaGLIPtin  (JANUVIA) 100 MG tablet Take 100 mg by mouth every morning.     . valsartan (DIOVAN) 80 MG tablet Take 1 tablet (80 mg total) by mouth daily. (Patient taking differently: Take 80 mg by mouth every morning. )     No current facility-administered medications for this visit.     PHYSICAL EXAMINATION: ECOG PERFORMANCE STATUS: 1 - Symptomatic but completely ambulatory  Vitals:   09/10/16 1454  BP: 131/63  Pulse: 67  Resp: 18  Temp: 98.3 F (36.8 C)   Filed Weights   09/10/16 1454  Weight: 206 lb 9.6 oz (93.7 kg)    GENERAL:alert, no distress and comfortable. She is obese SKIN: skin color, texture, turgor are normal, no rashes or significant lesions EYES: normal, Conjunctiva are pink and non-injected, sclera clear OROPHARYNX:no exudate, no erythema and lips, buccal mucosa, and tongue normal  NECK: supple, thyroid normal size, non-tender, without nodularity LYMPH:  no palpable lymphadenopathy in the cervical, axillary or inguinal LUNGS: clear to auscultation and percussion with normal breathing effort HEART: regular rate & rhythm and no murmurs and no lower extremity edema ABDOMEN:abdomen soft, non-tender and normal bowel sounds. Noted colostomy Musculoskeletal:no cyanosis of digits and no clubbing  NEURO: alert & oriented x 3 with fluent speech, no focal motor/sensory deficits Chest wall examination revealed well healed mastectomy scars with bilateral implant in situ. No palpable abnormalities  LABORATORY DATA:  I have reviewed the data as listed    Component Value Date/Time   NA 137 06/30/2016 0837   NA 142 11/26/2015 1020   K 3.4 (L) 06/30/2016 0837   K 3.7 11/26/2015 1020   CL 101 06/30/2016 0837   CO2 30 04/14/2016 1200   CO2 28 11/26/2015 1020   GLUCOSE 140 (H) 06/30/2016 0837   GLUCOSE 117 11/26/2015 1020   BUN 21 (H) 06/30/2016 0837   BUN 14.4 11/26/2015 1020   CREATININE 1.10 (H) 06/30/2016 0837   CREATININE 1.1 11/26/2015 1020   CALCIUM 10.1 04/14/2016 1200    CALCIUM 9.4 11/26/2015 1020   PROT 7.7 02/26/2016 1002   PROT 7.3 11/26/2015 1020   ALBUMIN 4.1 02/26/2016 1002   ALBUMIN 2.8 (L) 11/26/2015 1020   AST 12 02/26/2016 1002   AST 13 11/26/2015 1020   ALT 12 02/26/2016 1002   ALT 18 11/26/2015 1020   ALKPHOS 70 02/26/2016 1002   ALKPHOS 86 11/26/2015 1020   BILITOT 0.4 02/26/2016 1002   BILITOT <0.30 11/26/2015 1020   GFRNONAA 58 (L) 04/14/2016 1200   GFRNONAA 78 12/16/2014 1530   GFRAA >60 04/14/2016 1200   GFRAA >89 12/16/2014 1530  No results found for: SPEP, UPEP  Lab Results  Component Value Date   WBC 2.3 (L) 09/10/2016   NEUTROABS 0.6 (L) 09/10/2016   HGB 11.7 09/10/2016   HCT 37.3 09/10/2016   MCV 84.2 09/10/2016   PLT 226 09/10/2016      Chemistry      Component Value Date/Time   NA 137 06/30/2016 0837   NA 142 11/26/2015 1020   K 3.4 (L) 06/30/2016 0837   K 3.7 11/26/2015 1020   CL 101 06/30/2016 0837   CO2 30 04/14/2016 1200   CO2 28 11/26/2015 1020   BUN 21 (H) 06/30/2016 0837   BUN 14.4 11/26/2015 1020   CREATININE 1.10 (H) 06/30/2016 0837   CREATININE 1.1 11/26/2015 1020      Component Value Date/Time   CALCIUM 10.1 04/14/2016 1200   CALCIUM 9.4 11/26/2015 1020   ALKPHOS 70 02/26/2016 1002   ALKPHOS 86 11/26/2015 1020   AST 12 02/26/2016 1002   AST 13 11/26/2015 1020   ALT 12 02/26/2016 1002   ALT 18 11/26/2015 1020   BILITOT 0.4 02/26/2016 1002   BILITOT <0.30 11/26/2015 1020      ASSESSMENT & PLAN:  Breast cancer of upper-inner quadrant of left female breast ([place]) Final staging is T2 N0 M0, ER/PR positive HER-2/neu negative left breast cancer, status post bilateral mastectomy. She does not need further local therapy such as radiation treatment. All margins were negative and SLN biopsies were negative. She tolerated Arimidex well without major side effects, duration for 5 years until July 2022.  We'll continue the same. I will see her back in 6 months Her next bone density scan is due in  July 2019  Chronic neutropenia Her CBC today is low, likely related to transient worsening bone marrow suppresion from recent infection I recommend more frequent injection to every 5 days. I refill her prescription of GCSF. For future surgery, due to her history of recurrent infection, I recommend we titrate the dosage of Neupogen to keep a consistently greater than 1.5 in the perioperative setting.  History of ovarian & endometrial cancer Her CT scan is scheduled in the near future She has close follow-up with gynecologist oncologist. We discussed the role of tumor marker monitoring and at present time, we will only follow her with surveillance imaging only   Ureteral stricture, right She has frequent UTI and intermittent stent exchange every 3 months   No orders of the defined types were placed in this encounter.  All questions were answered. The patient knows to call the clinic with any problems, questions or concerns. No barriers to learning was detected. I spent 25 minutes counseling the patient face to face. The total time spent in the appointment was 30 minutes and more than 50% was on counseling and review of test results     Heath Lark, MD 09/10/2016 7:40 PM

## 2016-09-10 NOTE — Assessment & Plan Note (Signed)
She has frequent UTI and intermittent stent exchange every 3 months

## 2016-09-10 NOTE — Telephone Encounter (Signed)
Appointments scheduled per 09/10/16 los. Patient was given a copy of the AVS report and appointment schedule per 09/10/16 los.

## 2016-09-10 NOTE — Assessment & Plan Note (Addendum)
Her CT scan is scheduled in the near future She has close follow-up with gynecologist oncologist. We discussed the role of tumor marker monitoring and at present time, we will only follow her with surveillance imaging only

## 2016-09-10 NOTE — Assessment & Plan Note (Signed)
Final staging is T2 N0 M0, ER/PR positive HER-2/neu negative left breast cancer, status post bilateral mastectomy. She does not need further local therapy such as radiation treatment. All margins were negative and SLN biopsies were negative. She tolerated Arimidex well without major side effects, duration for 5 years until July 2022.  We'll continue the same. I will see her back in 6 months Her next bone density scan is due in July 2019

## 2016-09-15 ENCOUNTER — Encounter (HOSPITAL_COMMUNITY): Payer: Self-pay

## 2016-09-21 ENCOUNTER — Telehealth: Payer: Self-pay

## 2016-09-21 NOTE — Telephone Encounter (Signed)
Spoke with [name] and told her that she needed to have her kidney function checked with in 6 weeks of her CT scan on 10-07-16.  She will have labs on 09-28-16 at 2 pm. Verified with Tedra Coupe in radiology that she is scheduled for a CT C/A/P.

## 2016-09-23 ENCOUNTER — Encounter: Payer: Self-pay | Admitting: Family Medicine

## 2016-09-24 NOTE — Telephone Encounter (Signed)
See labs-- pt being sent to nephrology

## 2016-09-28 ENCOUNTER — Other Ambulatory Visit: Payer: Self-pay | Admitting: Urology

## 2016-09-28 ENCOUNTER — Other Ambulatory Visit: Payer: Self-pay | Admitting: Family Medicine

## 2016-09-28 ENCOUNTER — Other Ambulatory Visit (HOSPITAL_BASED_OUTPATIENT_CLINIC_OR_DEPARTMENT_OTHER): Payer: BLUE CROSS/BLUE SHIELD

## 2016-09-28 DIAGNOSIS — R809 Proteinuria, unspecified: Secondary | ICD-10-CM

## 2016-09-28 DIAGNOSIS — C541 Malignant neoplasm of endometrium: Secondary | ICD-10-CM | POA: Diagnosis not present

## 2016-09-28 DIAGNOSIS — C50212 Malignant neoplasm of upper-inner quadrant of left female breast: Secondary | ICD-10-CM | POA: Diagnosis not present

## 2016-09-28 DIAGNOSIS — N130 Hydronephrosis with ureteropelvic junction obstruction: Secondary | ICD-10-CM | POA: Diagnosis not present

## 2016-09-28 DIAGNOSIS — Z8543 Personal history of malignant neoplasm of ovary: Secondary | ICD-10-CM

## 2016-09-28 LAB — COMPREHENSIVE METABOLIC PANEL
ALT: 11 U/L (ref 0–55)
AST: 11 U/L (ref 5–34)
Albumin: 3.5 g/dL (ref 3.5–5.0)
Alkaline Phosphatase: 95 U/L (ref 40–150)
Anion Gap: 10 mEq/L (ref 3–11)
BILIRUBIN TOTAL: 0.27 mg/dL (ref 0.20–1.20)
BUN: 22.6 mg/dL (ref 7.0–26.0)
CHLORIDE: 104 meq/L (ref 98–109)
CO2: 27 meq/L (ref 22–29)
CREATININE: 1 mg/dL (ref 0.6–1.1)
Calcium: 10.2 mg/dL (ref 8.4–10.4)
EGFR: 64 mL/min/{1.73_m2} — ABNORMAL LOW (ref >90)
GLUCOSE: 90 mg/dL (ref 70–140)
Potassium: 3.6 mEq/L (ref 3.5–5.1)
Sodium: 141 mEq/L (ref 136–145)
TOTAL PROTEIN: 7.4 g/dL (ref 6.4–8.3)

## 2016-09-28 LAB — CBC WITH DIFFERENTIAL/PLATELET
BASO%: 2.3 % — ABNORMAL HIGH (ref 0.0–2.0)
Basophils Absolute: 0.1 10*3/uL (ref 0.0–0.1)
EOS%: 4.3 % (ref 0.0–7.0)
Eosinophils Absolute: 0.1 10*3/uL (ref 0.0–0.5)
HCT: 34.2 % — ABNORMAL LOW (ref 34.8–46.6)
HGB: 11 g/dL — ABNORMAL LOW (ref 11.6–15.9)
LYMPH#: 0.7 10*3/uL — AB (ref 0.9–3.3)
LYMPH%: 30.4 % (ref 14.0–49.7)
MCH: 26.7 pg (ref 25.1–34.0)
MCHC: 32.2 g/dL (ref 31.5–36.0)
MCV: 82.8 fL (ref 79.5–101.0)
MONO#: 0.6 10*3/uL (ref 0.1–0.9)
MONO%: 25.5 % — ABNORMAL HIGH (ref 0.0–14.0)
NEUT%: 37.5 % — AB (ref 38.4–76.8)
NEUTROS ABS: 0.9 10*3/uL — AB (ref 1.5–6.5)
PLATELETS: 275 10*3/uL (ref 145–400)
RBC: 4.13 10*6/uL (ref 3.70–5.45)
RDW: 17 % — ABNORMAL HIGH (ref 11.2–14.5)
WBC: 2.3 10*3/uL — AB (ref 3.9–10.3)

## 2016-09-29 ENCOUNTER — Telehealth: Payer: Self-pay

## 2016-09-29 ENCOUNTER — Other Ambulatory Visit: Payer: Self-pay | Admitting: Urology

## 2016-09-29 DIAGNOSIS — H2513 Age-related nuclear cataract, bilateral: Secondary | ICD-10-CM | POA: Diagnosis not present

## 2016-09-29 DIAGNOSIS — H5213 Myopia, bilateral: Secondary | ICD-10-CM | POA: Diagnosis not present

## 2016-09-29 LAB — HM DIABETES EYE EXAM

## 2016-09-29 NOTE — Telephone Encounter (Signed)
Instructed patient per below message. Verbalized understanding.

## 2016-09-29 NOTE — Telephone Encounter (Signed)
-----   Message from Heath Lark, MD sent at 09/28/2016  3:06 PM EST ----- Regarding: CBC Pls tell her CBC is a bit better but ANC is still low I would recommend she increase frequency of inj to every 4 days and I will add lab appt on 3/8 at 930 am before her CT ----- Message ----- From: Interface, Lab In Three Zero One Sent: 09/28/2016   1:55 PM To: Heath Lark, MD

## 2016-09-30 ENCOUNTER — Other Ambulatory Visit: Payer: Self-pay | Admitting: Urology

## 2016-10-05 ENCOUNTER — Telehealth: Payer: Self-pay | Admitting: *Deleted

## 2016-10-05 NOTE — Telephone Encounter (Signed)
"  [name] with [facility] calling f or clarification of Neupogen order for this patient."  On hold for five minutes.  [name] says she will have Pharmacist call [facility] office directly with questions about this order.

## 2016-10-07 ENCOUNTER — Other Ambulatory Visit: Payer: Self-pay | Admitting: *Deleted

## 2016-10-07 ENCOUNTER — Ambulatory Visit (HOSPITAL_COMMUNITY)
Admission: RE | Admit: 2016-10-07 | Discharge: 2016-10-07 | Disposition: A | Discharge status: Home / Self Care | Payer: BLUE CROSS/BLUE SHIELD | Source: Ambulatory Visit | Attending: Gynecologic Oncology | Admitting: Gynecologic Oncology

## 2016-10-07 ENCOUNTER — Other Ambulatory Visit (HOSPITAL_BASED_OUTPATIENT_CLINIC_OR_DEPARTMENT_OTHER): Payer: BLUE CROSS/BLUE SHIELD

## 2016-10-07 ENCOUNTER — Other Ambulatory Visit: Payer: BLUE CROSS/BLUE SHIELD

## 2016-10-07 DIAGNOSIS — D709 Neutropenia, unspecified: Secondary | ICD-10-CM

## 2016-10-07 DIAGNOSIS — E279 Disorder of adrenal gland, unspecified: Secondary | ICD-10-CM | POA: Diagnosis not present

## 2016-10-07 DIAGNOSIS — Z9889 Other specified postprocedural states: Secondary | ICD-10-CM | POA: Diagnosis not present

## 2016-10-07 DIAGNOSIS — Z8543 Personal history of malignant neoplasm of ovary: Secondary | ICD-10-CM | POA: Insufficient documentation

## 2016-10-07 DIAGNOSIS — N1339 Other hydronephrosis: Secondary | ICD-10-CM | POA: Diagnosis not present

## 2016-10-07 DIAGNOSIS — R918 Other nonspecific abnormal finding of lung field: Secondary | ICD-10-CM | POA: Diagnosis not present

## 2016-10-07 DIAGNOSIS — R1031 Right lower quadrant pain: Secondary | ICD-10-CM | POA: Diagnosis not present

## 2016-10-07 DIAGNOSIS — C50212 Malignant neoplasm of upper-inner quadrant of left female breast: Secondary | ICD-10-CM

## 2016-10-07 LAB — COMPREHENSIVE METABOLIC PANEL
ALBUMIN: 3.8 g/dL (ref 3.5–5.0)
ALK PHOS: 123 U/L (ref 40–150)
ALT: 16 U/L (ref 0–55)
AST: 15 U/L (ref 5–34)
Anion Gap: 11 mEq/L (ref 3–11)
BUN: 23.6 mg/dL (ref 7.0–26.0)
CALCIUM: 10.7 mg/dL — AB (ref 8.4–10.4)
CO2: 30 mEq/L — ABNORMAL HIGH (ref 22–29)
CREATININE: 1 mg/dL (ref 0.6–1.1)
Chloride: 99 mEq/L (ref 98–109)
EGFR: 62 mL/min/{1.73_m2} — ABNORMAL LOW (ref >90)
Glucose: 108 mg/dl (ref 70–140)
POTASSIUM: 4.5 meq/L (ref 3.5–5.1)
Sodium: 141 mEq/L (ref 136–145)
TOTAL PROTEIN: 8.2 g/dL (ref 6.4–8.3)
Total Bilirubin: 0.31 mg/dL (ref 0.20–1.20)

## 2016-10-07 LAB — CBC WITH DIFFERENTIAL/PLATELET
BASO%: 0.7 % (ref 0.0–2.0)
BASOS ABS: 0 10*3/uL (ref 0.0–0.1)
EOS%: 3.5 % (ref 0.0–7.0)
Eosinophils Absolute: 0.2 10*3/uL (ref 0.0–0.5)
HEMATOCRIT: 39.8 % (ref 34.8–46.6)
HEMOGLOBIN: 12.6 g/dL (ref 11.6–15.9)
LYMPH#: 0.7 10*3/uL — AB (ref 0.9–3.3)
LYMPH%: 11.9 % — ABNORMAL LOW (ref 14.0–49.7)
MCH: 27 pg (ref 25.1–34.0)
MCHC: 31.7 g/dL (ref 31.5–36.0)
MCV: 85.4 fL (ref 79.5–101.0)
MONO#: 0.7 10*3/uL (ref 0.1–0.9)
MONO%: 11.5 % (ref 0.0–14.0)
NEUT%: 72.4 % (ref 38.4–76.8)
NEUTROS ABS: 4.3 10*3/uL (ref 1.5–6.5)
Platelets: 274 10*3/uL (ref 145–400)
RBC: 4.66 10*6/uL (ref 3.70–5.45)
RDW: 16.4 % — AB (ref 11.2–14.5)
WBC: 6 10*3/uL (ref 3.9–10.3)

## 2016-10-07 MED ORDER — IOPAMIDOL (ISOVUE-300) INJECTION 61%
INTRAVENOUS | Status: AC
  Administered 2016-10-07: 100 mL
  Filled 2016-10-07: qty 100

## 2016-10-11 ENCOUNTER — Encounter: Payer: Self-pay | Admitting: Gynecologic Oncology

## 2016-10-11 ENCOUNTER — Ambulatory Visit: Payer: BLUE CROSS/BLUE SHIELD | Attending: Gynecologic Oncology | Admitting: Gynecologic Oncology

## 2016-10-11 ENCOUNTER — Telehealth: Payer: Self-pay

## 2016-10-11 VITALS — BP 128/68 | HR 65 | Temp 98.4°F | Resp 20 | Wt 207.7 lb

## 2016-10-11 DIAGNOSIS — Z933 Colostomy status: Secondary | ICD-10-CM | POA: Diagnosis not present

## 2016-10-11 DIAGNOSIS — Z170 Estrogen receptor positive status [ER+]: Secondary | ICD-10-CM | POA: Diagnosis not present

## 2016-10-11 DIAGNOSIS — Z90710 Acquired absence of both cervix and uterus: Secondary | ICD-10-CM | POA: Insufficient documentation

## 2016-10-11 DIAGNOSIS — Z923 Personal history of irradiation: Secondary | ICD-10-CM | POA: Diagnosis not present

## 2016-10-11 DIAGNOSIS — Z853 Personal history of malignant neoplasm of breast: Secondary | ICD-10-CM

## 2016-10-11 DIAGNOSIS — Z8542 Personal history of malignant neoplasm of other parts of uterus: Secondary | ICD-10-CM

## 2016-10-11 DIAGNOSIS — Z803 Family history of malignant neoplasm of breast: Secondary | ICD-10-CM | POA: Insufficient documentation

## 2016-10-11 DIAGNOSIS — Z79899 Other long term (current) drug therapy: Secondary | ICD-10-CM | POA: Insufficient documentation

## 2016-10-11 DIAGNOSIS — N1330 Unspecified hydronephrosis: Secondary | ICD-10-CM | POA: Diagnosis not present

## 2016-10-11 DIAGNOSIS — Z9889 Other specified postprocedural states: Secondary | ICD-10-CM | POA: Insufficient documentation

## 2016-10-11 DIAGNOSIS — C569 Malignant neoplasm of unspecified ovary: Secondary | ICD-10-CM | POA: Insufficient documentation

## 2016-10-11 DIAGNOSIS — K435 Parastomal hernia without obstruction or  gangrene: Secondary | ICD-10-CM | POA: Diagnosis not present

## 2016-10-11 DIAGNOSIS — D630 Anemia in neoplastic disease: Secondary | ICD-10-CM | POA: Diagnosis not present

## 2016-10-11 DIAGNOSIS — K219 Gastro-esophageal reflux disease without esophagitis: Secondary | ICD-10-CM | POA: Insufficient documentation

## 2016-10-11 DIAGNOSIS — Z808 Family history of malignant neoplasm of other organs or systems: Secondary | ICD-10-CM | POA: Insufficient documentation

## 2016-10-11 DIAGNOSIS — Z833 Family history of diabetes mellitus: Secondary | ICD-10-CM | POA: Insufficient documentation

## 2016-10-11 DIAGNOSIS — Z800 Family history of malignant neoplasm of digestive organs: Secondary | ICD-10-CM | POA: Insufficient documentation

## 2016-10-11 DIAGNOSIS — E039 Hypothyroidism, unspecified: Secondary | ICD-10-CM | POA: Diagnosis not present

## 2016-10-11 DIAGNOSIS — K449 Diaphragmatic hernia without obstruction or gangrene: Secondary | ICD-10-CM | POA: Diagnosis not present

## 2016-10-11 DIAGNOSIS — Z79810 Long term (current) use of selective estrogen receptor modulators (SERMs): Secondary | ICD-10-CM | POA: Diagnosis not present

## 2016-10-11 DIAGNOSIS — I10 Essential (primary) hypertension: Secondary | ICD-10-CM | POA: Diagnosis not present

## 2016-10-11 DIAGNOSIS — Z880 Allergy status to penicillin: Secondary | ICD-10-CM | POA: Insufficient documentation

## 2016-10-11 DIAGNOSIS — Z9013 Acquired absence of bilateral breasts and nipples: Secondary | ICD-10-CM | POA: Diagnosis not present

## 2016-10-11 DIAGNOSIS — Z8543 Personal history of malignant neoplasm of ovary: Secondary | ICD-10-CM | POA: Diagnosis not present

## 2016-10-11 DIAGNOSIS — E559 Vitamin D deficiency, unspecified: Secondary | ICD-10-CM | POA: Diagnosis not present

## 2016-10-11 DIAGNOSIS — E782 Mixed hyperlipidemia: Secondary | ICD-10-CM | POA: Insufficient documentation

## 2016-10-11 DIAGNOSIS — Z7984 Long term (current) use of oral hypoglycemic drugs: Secondary | ICD-10-CM | POA: Insufficient documentation

## 2016-10-11 DIAGNOSIS — Z9221 Personal history of antineoplastic chemotherapy: Secondary | ICD-10-CM | POA: Diagnosis not present

## 2016-10-11 DIAGNOSIS — Z807 Family history of other malignant neoplasms of lymphoid, hematopoietic and related tissues: Secondary | ICD-10-CM | POA: Insufficient documentation

## 2016-10-11 DIAGNOSIS — Z888 Allergy status to other drugs, medicaments and biological substances status: Secondary | ICD-10-CM | POA: Diagnosis not present

## 2016-10-11 DIAGNOSIS — Z8249 Family history of ischemic heart disease and other diseases of the circulatory system: Secondary | ICD-10-CM | POA: Insufficient documentation

## 2016-10-11 DIAGNOSIS — C541 Malignant neoplasm of endometrium: Secondary | ICD-10-CM

## 2016-10-11 DIAGNOSIS — E11319 Type 2 diabetes mellitus with unspecified diabetic retinopathy without macular edema: Secondary | ICD-10-CM | POA: Insufficient documentation

## 2016-10-11 DIAGNOSIS — C50912 Malignant neoplasm of unspecified site of left female breast: Secondary | ICD-10-CM | POA: Diagnosis not present

## 2016-10-11 NOTE — Patient Instructions (Signed)
Dr. Denman [name] scheduled a CT Chest Sharlee Blew Jerene Pitch for June 2018. Central scheduling will contact you with that appointment. You will follow up with Dr. Denman [name] on January 24, 2017 at 1 pm.  Please arrive at 1245 pm to register.

## 2016-10-11 NOTE — Telephone Encounter (Signed)
Called and instructed patient that it is okay to reduce Granix back to every 6 days per Dr. Alvy Bimler.

## 2016-10-11 NOTE — Progress Notes (Signed)
Recurrent Endometrial cancer FOLLOWUP VISIT  Assessment:    61 y.o. year old with history of recurrent endometrioid endometrial/ovarian cancer.   S/p exploratory laparotomy, posterior supralevator pelvic exenteration, end colostomy, bladder repair, ureteral stenting with complete resection and negative margins on 11/09/14. S/p adjuvant radiation completed 03/10/15  Postoperative and post-radiation right hydroureter and distal ureteral obstruction with ureteral stent in situ.  No recurrence/persistence of tumor on imaging and physical exam (complete clinical response).   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 No specific follow-up or surveillance recommended by genetics counselor.  Plan: 1) Recurrent endometrial/ovarian cancer: s/p complete resection with negative margins and s/p adjuvant radiation. No chemotherapy due to chronic idiopathic neutropenia.   CT imaging post treatment shows stable thickening of upper vagina and right peri-urethral tissues which is most likely radiation changes and postop changes.  2) Right hydroureter and distal ureteral obstruction - secondary to post operative scaring and post-radiation changes. Appreciate Dr Zettie Pho assistance with dilation and stenting. Last stent exchange November 2017. Consideration for re-implantation and possibly colostomy reversal when she is 2+ years from diagnosis (April, 2018)  3) neutropenia - Dr Alvy Bimler is managing this and we appreciate. Patient is having bone and joint aches and wonders if this is from more frequent Granix injections.  4) breast cancer - stage IIA. ER/PR positive. Treated with surgery and arimidex.  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 Will have patient follow-up with Dr Cristina Gong after breast surgery for close colon cancer and upper GI cancer surveillance  6) Follow-up: I will  see [name] for followup in June, 2018, with a follow-up CT chest, abdomen and pelvis. If this remains stable, 2 years after completing adjuvant/salvage therapy, she may be considered a better candidate for reversal of colostomy and possible reimplantation of right ureter.  HPI:  [name] is a 61 y.o. 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  She has a history of endometrial and ovarian endometrioid carcinoma treated in 2006 by Dr. [name] at [facility] in [place]. Her surgery (TAH, BSO) was followed by adjuvant chemotherapy with carboplatin plus paclitaxel due to the ovarian involvement and the presence of a cul de sac lesion also positive for disease. She denies receiving adjuvant radiation therapy. She is unclear if she had metastatic endometrial cancer to the ovary or duel primaries. She had a complete response to therapy however developed leukopenia in July 2010. 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 She sees Dr. Alvy Bimler for this and is treated with G-CSF injections.  She began experiencing rectal pain approximately in January 2016. She also reports narrowing of caliber of the stool. She denies hematochezia. She does report approximately 3 months of vaginal spotting. She's had no specific follow-up for her gynecologic cancers in the past 4 years.  As part of workup of her rectal pain she underwent a CT scan of the abdomen and pelvis on 09/12/2014. This demonstrated a new right perirectal mass abutting the vaginal cuff measuring 3.8 x 4.9 cm. There is a limited fat plane between the mass and the rectum posteriorly. Rectal invasion could not be excluded. There were no other masses identified in the abdomen and pelvis or lymphadenopathy. There  is no other evidence of metastatic  disease or recurrent disease. There was no hydronephrosis. A CA-125 drawn on 09/12/2014 was normal at 10.  PET was negative for extrapelvic disease. MRI defined the lesion as a 3.5x5cm lesion to the right of the rectum at the vaginal cuff.  Colonoscopy was performed on 10/02/14 with transrectal [place] and biopsy and this revealed endometrioid adenocarcinoma. Of note, the lesion was not seen within the lumen of the rectum.   She then underwent a posterior supralevator exenteration with colostomy and bladder repair and stent placement on 1/61/09 without complications.  Her postoperative course was uncomplicate with the exception of development of postop anemia.  Her final pathology revealed endometrioid adenocarcinoma invading the vagina and rectum with negative margins on the specimen. The margin had been close (clinically) to the right pelvic sidewall which was marked with surgical clips.  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 It was treated with IV antibiotics and then on POD 15 she was taken to the OR for laparoscopic revision of the stoma with Dr Michael [name]. Postoperatively she had wound vac and packing for her stomal wound.  A retrograde cystogram on week 4 postop confirmed an intact bladder and the foley was removed.  She initially had some voiding issues with decreased sensation to void. We tested a post void residual in May, 2016 and this revealed adequate voiding.  On 12/30/14 she was admitted to [facility] with sepsis associated with Enterobacter Cloacae UTI. This was treated with IV antibiotics and then a prolonged course of oral cipro. Imaging performed at the time of admission (a CT of the abdo/pelvis) revealed: Bilateral double-J internal ureteral stents in adequate position with persistent mild bilateral hydronephrosis   She complete radiation therapy from 01/29/15 to 03/10/15 with  50Gy of external beam radiation and IMRT. She tolerated therapy well with minor skin irritation.  She had her ureteral stents removed in June, 2016, however, then developed right ureteral obstruction, hydroureter and pain. She went to the OR with Dr Tresa Moore on 03/20/15 for ureteral dilation and right stent placement (the left was draining well). No tumor was seen on cysto.   Right ureteral stent now out. No vaginal bleeding. Stoma working well. Gaining weight. Mood improved.   CT abdo/pelvis on 05/14/15 showed: no new lesions, stable thickening in right pelvis/distal right ureter consistent with radiation effect.  She has had bronchitis symptoms for >42month s/p levaquin trial.  The patient's CT in January 2017 showed progression of her right ureteral obstruction after stent removal. Dr MTresa Mooretook her to the OR on 09/05/15 for a cystoscopy, retrograde pyelogram and right ureteral stent placement.   The CT on 08/18/15 showed decreased attenuation of the right renal parenchyma, right hydronephrosis that was severe no excretion of contrast on that for graphic phase imaging. The perivascular soft tissue thickening in the pelvis and presacral regions grossly stable consistent with radiation changes.  The patient was diagnosed with ER/PR positive left breast cancer in April 2017. Surgery is scheduled for May. She is having a bilateral mastectomy with expander reconstruction.  Repeat CT imaging on 02/08/16 revealed : Stable post treatment changes in presacral region. No evidence of recurrent or metastatic carcinoma within the abdomen or pelvis. Interval placement of right ureteral stent in appropriate position. Moderate right hydroureteronephrosis remains stable. Stable benign left adrenal adenoma and hepatic steatosis.  On 06/30/16 she had replacement of her right ureteral stent with Dr MTresa Moore  Interval Hx:  On  10/07/16 CT surveillance was performed which showed: Stable exam.  No new or progressive findings.  Stable appearance abnormal presacral soft tissue compatible with post treatment change. Internal right ureteral stent with persistent right hydroureteronephrosis. Differentially decreased perfusion of the right kidney suggests a component of underlying obstructive uropathy. Stable 13 mm left adrenal nodule, previously characterized as adenoma. A parastomal hernia was noted.  She has been noting increased joint aches since increasing the frequency of Granix injections. She had a mildly elevated 24 hour urine creatinine, though serum creatinine is 1.0. She will see Dr Madelon Lips for this. She is due for a right ureteral stent change later this month. She is highly motivated for a reversal procedure to take down her colostomy and potentially reimplant her right ureter.    Review of systems: Constitutional:  She has no weight gain or weight loss. She has a low grade fever no chills. Eyes: No blurred vision Ears, Nose, Mouth, Throat: No dizziness, headaches or changes in hearing. No mouth sores. Cardiovascular: No chest pain, palpitations or edema. Respiratory:  No shortness of breath, wheezing  Gastrointestinal: She has normal bowel movements trhough stoma without diarrhea or constipation. She denies any nausea or vomiting. She denies blood in her stool or heart burn. Genitourinary:  See HPI Musculoskeletal: Denies muscle weakness or joint pains.  Skin:  She has no skin changes, rashes or itching Neurological:  Denies dizziness or headaches. No neuropathy, no numbness or tingling. Psychiatric:  She denies depression or anxiety. Hematologic/Lymphatic:   No easy bruising or bleeding  Allergies  Allergen Reactions  . Penicillins Swelling    Facial swelling Has patient had a PCN reaction causing immediate rash, facial/tongue/throat swelling, SOB or lightheadedness with hypotension: Yes Has patient had a PCN reaction causing severe rash involving mucus membranes or skin necrosis: Yes Has  patient had a PCN reaction that required hospitalization No Has patient had a PCN reaction occurring within the last 10 years: No If all of the above answers are "NO", then may proceed with Cephalosporin use.   [name] Kitchen Ultram [Tramadol] Hives  . Adhesive [Tape]     blisters  . Cefaclor Rash    Ceclor  . Erythromycin     Gastritis, abd cramps  . Trimethoprim Rash  . Pectin Rash    Pectin ring for stoma  . Sulfa Antibiotics Rash    Current Outpatient Prescriptions on File Prior to Visit  Medication Sig Dispense Refill  . anastrozole (ARIMIDEX) 1 MG tablet Take 1 tablet (1 mg total) by mouth daily. (Patient taking differently: Take 1 mg by mouth every morning. ) 90 tablet 3  . Biotin 5 MG TABS Take 5 mg by mouth every morning.     . Canagliflozin (INVOKANA) 300 MG TABS Take 300 mg by mouth every morning.     . Cholecalciferol (VITAMIN D3) 10000 UNITS capsule Take 10,000 Units by mouth once a week. Sunday evening's    . diphenhydrAMINE (BENADRYL) 25 MG tablet Take 25 mg by mouth every 4 (four) hours as needed for itching, allergies or sleep. Reported on 09/15/2015    . filgrastim (NEUPOGEN) 480 MCG/1.6ML injection Inject 1.6 ml under the skin every 5 days for life 16 mL 11  . levothyroxine (SYNTHROID, LEVOTHROID) 150 MCG tablet Take 150 mcg by mouth daily.    [name] Kitchen loratadine (CLARITIN) 10 MG tablet Take 10 mg by mouth every morning.     . metFORMIN (GLUCOPHAGE) 1000 MG tablet Take 1,000 mg by mouth 2 (two) times daily  with a meal.     . omega-3 acid ethyl esters (LOVAZA) 1 G capsule Take 1 g by mouth 2 (two) times daily.    [name] Kitchen omeprazole (PRILOSEC) 20 MG capsule Take 20 mg by mouth every other day. Other other night    . Polyethyl Glycol-Propyl Glycol (SYSTANE OP) Place 1 drop into both eyes at bedtime.     . Prenatal Vit-Fe Fumarate-FA (PRENATAL VITAMIN PO) Take 1 capsule by mouth daily.    . rosuvastatin (CRESTOR) 10 MG tablet Take 10 mg by mouth every evening.     . sitaGLIPtin (JANUVIA) 100 MG  tablet Take 100 mg by mouth every morning.     . valsartan (DIOVAN) 80 MG tablet Take 1 tablet (80 mg total) by mouth daily. (Patient taking differently: Take 80 mg by mouth every morning. )    . Calcium-Magnesium-Vitamin D 400-166.7-133.3 MG-MG-UNIT TABS Take 2 tablets by mouth daily.     No current facility-administered medications on file prior to visit.     Past Medical History:  Diagnosis Date  . Anemia in neoplastic disease   . Benign essential HTN   . 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 Cancer of corpus uteri, except isthmus [facility]) dx 10-15-2004 oncologist-- dr Denman [name] and dr Alvy Bimler     dx endometrial and ovarian cancer s/p  chemotheapy and surgery:  recurrent 11-19-2014 w/ radiation 01-29-2015 to 03-10-2015  . Chronic idiopathic neutropenia (HCC)    presumed related to chemotherapy March 2006--- followed by dr Alvy Bimler   . Complication of anesthesia    PONV  . Diabetic retinopathy, background ([place])   . DM type 2 (diabetes mellitus, type 2) ([place])    monitored by dr Legrand Como altheimer  . Dysuria   . GERD (gastroesophageal reflux disease)    takes omeprazole  . Hiatal hernia   . History of bronchitis   . History of gastric polyp    2014  duodenum  . History of radiation therapy    01-29-2015 to 03-10-2015  pelvis 50.4Gy  . Hypothyroidism    monitored by dr Legrand Como altheimer  . Mixed dyslipidemia   . Multiple thyroid nodules    Managed by Dr. Harlow Asa  . PONV (postoperative nausea and vomiting)    "scopolamine patch works for me"  . Radiation-induced dermatitis    contact dermatitis , radiation completed, rash only on ankles now.  . Right flank pain   . S/P colostomy ([place])   . Seasonal allergies   . Shoulder stiffness    bilateral   . Ureteral stricture, right UROLOGIT-  DR [facility]    CHRONIC--  TREATMENT URETERAL STENT  . Vitamin D deficiency   . Wears glasses     Past Surgical History:  Procedure Laterality Date  . APPENDECTOMY    . BREAST RECONSTRUCTION WITH PLACEMENT OF TISSUE EXPANDER AND FLEX HD (ACELLULAR HYDRATED DERMIS) Bilateral 12/11/2015   Procedure: BILATERAL BREAST RECONSTRUCTION WITH PLACEMENT OF TISSUE EXPANDERS;  Surgeon: Irene Limbo, MD;  Location: [place];  Service: Plastics;  Laterality: Bilateral;  . COLONOSCOPY WITH PROPOFOL N/A 08/21/2013   Procedure: COLONOSCOPY WITH PROPOFOL;  Surgeon: Cleotis Nipper, MD;  Location[facility];  Service: Endoscopy;  Laterality: N/A;  . COLOSTOMY TAKEDOWN N/A 12/04/2014   Procedure: LAPROSCOPIC LYSIS OF ADHESIONS, SPLENIC MOBILIZATION, RELOCATION OF COLOSTOMY, DEBRIDEMENT INITIAL COLOSTOMY  SITE;  Surgeon: Michael [name], MD;  Location: [facility];  Service: General;  Laterality: N/A;  . CYSTOSCOPY W/ RETROGRADES Right 11/21/2015   Procedure: CYSTOSCOPY WITH RETROGRADE PYELOGRAM;  Surgeon: Alexis Frock, MD;  [facility];  Service: Urology;  Laterality: Right;  . CYSTOSCOPY W/ URETERAL STENT PLACEMENT Right 11/21/2015   Procedure: CYSTOSCOPY WITH STENT REPLACEMENT;  Surgeon: Alexis Frock, MD;  [facility];  Service: Urology;  Laterality: Right;  . CYSTOSCOPY W/ URETERAL STENT PLACEMENT Right 03/10/2016   Procedure: CYSTOSCOPY WITH STENT REPLACEMENT;  Surgeon: Alexis Frock, MD;  Location: [facility];  Service: Urology;  Laterality: Right;  . CYSTOSCOPY W/ URETERAL STENT PLACEMENT Right 06/30/2016   Procedure: CYSTOSCOPY WITH RETROGRADE PYELOGRAM/URETERAL STENT EXCHANGE;  Surgeon: Alexis Frock, MD;  Location: [facility];  Service: Urology;  Laterality: Right;  . CYSTOSCOPY WITH RETROGRADE PYELOGRAM, URETEROSCOPY AND STENT PLACEMENT Right 03/20/2015   Procedure: CYSTOSCOPY WITH RETROGRADE PYELOGRAM, URETEROSCOPY WITH BALLOON DILATION AND STENT PLACEMENT ON RIGHT;  Surgeon:  Alexis Frock, MD;  Location: [facility];  Service: Urology;  Laterality: Right;  . CYSTOSCOPY WITH RETROGRADE PYELOGRAM, URETEROSCOPY AND STENT PLACEMENT Right 05/02/2015   Procedure: CYSTOSCOPY WITH RIGHT RETROGRADE PYELOGRAM,  DIAGNOSTIC URETEROSCOPY AND STENT PULL ;  Surgeon: Alexis Frock, MD;  Location: [facility];  Service: Urology;  Laterality: Right;  . CYSTOSCOPY WITH RETROGRADE PYELOGRAM, URETEROSCOPY AND STENT PLACEMENT Right 09/05/2015   Procedure: CYSTOSCOPY WITH RETROGRADE PYELOGRAM,  AND STENT PLACEMENT;  Surgeon: Alexis Frock, MD;  [facility];  Service: Urology;  Laterality: Right;  . EUS N/A 10/02/2014   Procedure: LOWER ENDOSCOPIC ULTRASOUND (EUS);  Surgeon: Arta Silence, MD;  Location: [facility];  Service: Endoscopy;  Laterality: N/A;  . EXCISION SOFT TISSUE MASS RIGHT FOREMAN  12-08-2006  . EYE SURGERY  as child   pytosis of eyelids repair  . INCISION AND DRAINAGE OF WOUND Bilateral 12/26/2015   Procedure: DEBRIDEMENT OF BILATERAL MASTECTOMY FLAPS;  Surgeon: Irene Limbo, MD;  Location: [place];  Service: Plastics;  Laterality: Bilateral;  . LAPAROSCOPIC CHOLECYSTECTOMY  1990  . LIPOSUCTION WITH LIPOFILLING Bilateral 04/16/2016   Procedure: LIPOSUCTION WITH LIPOFILLING TO BILATERAL CHEST;  Surgeon: Irene Limbo, MD;  Location: [place];  Service: Plastics;  Laterality: Bilateral;  . MASTECTOMY Right 12/11/2015   PROPHYLACTIC   . MASTECTOMY COMPLETE / SIMPLE W/ SENTINEL NODE BIOPSY Left 12/11/2015   AXILLARY SENTINEL LYMPH NODE BIOPSY   . MASTECTOMY W/ SENTINEL NODE BIOPSY Bilateral 12/11/2015   Procedure: RIGHT PROPHYLACTIC MASTECTOMY, LEFT TOTAL MASTECTOMY WITH LEFT AXILLARY SENTINEL LYMPH NODE BIOPSY;  Surgeon: Stark Klein, MD;  Location: [place];  Service: General;  Laterality: Bilateral;  . OSTOMY N/A 11/19/2014   Procedure: OSTOMY;  Surgeon: Michael [name], MD;  [facility];  Service:  General;  Laterality: N/A;  . REMOVAL OF BILATERAL TISSUE EXPANDERS WITH PLACEMENT OF BILATERAL BREAST IMPLANTS Bilateral 04/16/2016   Procedure: REMOVAL OF BILATERAL TISSUE EXPANDERS WITH PLACEMENT OF BILATERAL BREAST IMPLANTS;  Surgeon: Irene Limbo, MD;  Location: [place];  Service: Plastics;  Laterality: Bilateral;  . ROBOTIC ASSISTED LAP VAGINAL HYSTERECTOMY N/A 11/19/2014   Procedure: ROBOTIC LYSIS OF ADHESIONS, CONVERTED TO LAPAROTOMY RADICAL UPPER VAGINECTOMY,LOW ANTERIOR BOWEL RESECTION, COLOSTOMY, BILATERAL URETERAL STENT PLACEMENT AND CYSTONOMY CLOSURE;  Surgeon: Everitt Amber, MD;  [facility];  Service: Gynecology;  Laterality: N/A;  . TISSUE EXPANDER FILLING Bilateral 12/26/2015   Procedure: EXPANSION OF BILATERAL CHEST TISSUE EXPANDERS (24  mL- Right; 75 mL- Left);  Surgeon: Irene Limbo, MD;  Location: [place];  Service: Plastics;  Laterality: Bilateral;  . TONSILLECTOMY    . TOTAL ABDOMINAL HYSTERECTOMY  March 2006   [facility]   and Bilateral Salpingoophorectomy/  staging for Ovarian cancer/  an    Family History  Problem Relation Age of Onset  . Cancer Mother 57    stomach ca  . Hypertension Mother   . Cancer Father 32    prostate ca  . Diabetes Father   . Heart disease Father     CABG  . Breast cancer Maternal Aunt     dx in her 43s  . Lymphoma Paternal Aunt   . Brain cancer Paternal Grandfather   . Ovarian cancer Other   . Diabetes Sister   . Hypertension Brother y-10  . Heart disease Brother     CABG  . Diabetes Brother     Social History   Social History  . Marital status: Married    Spouse name: N/A  . Number of children: 1  . Years of education: N/A   Occupational History  . retired Therapist, sports from [place] History Main Topics  . Smoking status: Never Smoker  . Smokeless tobacco: Never Used  . Alcohol use Not on file     Comment: rare social  . Drug use: Unknown  . Sexual activity: Not Currently   Other  Topics Concern  . Not on file   Social History Narrative   Exercise-- has not gotten back into it since cancer came back   CBC    Component Value Date/Time   WBC 6.0 10/07/2016 0921   WBC 3.2 (L) 04/14/2016 1200   RBC 4.66 10/07/2016 0921   RBC 4.53 04/14/2016 1200   HGB 12.6 10/07/2016 0921   HCT 39.8 10/07/2016 0921   PLT 274 10/07/2016 0921   MCV 85.4 10/07/2016 0921   MCH 27.0 10/07/2016 0921   MCH 25.8 (L) 04/14/2016 1200   MCHC 31.7 10/07/2016 0921   MCHC 31.0 04/14/2016 1200   RDW 16.4 (H) 10/07/2016 0921   LYMPHSABS 0.7 (L) 10/07/2016 0921   MONOABS 0.7 10/07/2016 0921   EOSABS 0.2 10/07/2016 0921   BASOSABS 0.0 10/07/2016 0921     Physical Exam: Blood pressure 128/68, pulse 65, temperature 98.4 F (36.9 C), temperature source Oral, resp. rate 20, weight 207 lb 11.2 oz (94.2 kg), SpO2 100 %. General: Well dressed, well nourished in no apparent distress.   HEENT:  Normocephalic and atraumatic, no lesions.  Extraocular muscles intact. Sclerae anicteric. Pupils equal, round, reactive. No mouth sores or ulcers. Thyroid is normal size, not nodular, midline. Skin:  No lesions or rashes. Breasts:  deferred Lungs:  Clear to auscultation bilaterally.  No wheezes. Cardiovascular:  Regular rate and rhythm.  No murmurs or rubs. Abdomen:  Soft, nontender, nondistended.  No palpable masses.  No hepatosplenomegaly.  No ascites. Normal bowel sounds.  No hernias.  Incision is healed. Laparoscopic incision healed. Stomal site healed. Stomal appliance in situ. Genitourinary: vaginal cuff intact without blood or fluid in the vault. Improved length and elasticity. Normal mucosa.No palpable masses in pelvis. Rectal exam: stump in tact, no masses Extremities: No cyanosis, clubbing or edema.  No calf tenderness or erythema. No palpable cords. Psychiatric: Mood and affect are appropriate. Neurological: Awake, alert and oriented x 3. Sensation is intact, no neuropathy.  Musculoskeletal: No  pain, normal strength and range of motion.  Everitt Amber  Chrys Racer, MD  CC: Dr Tresa Moore, Dr Alvy Bimler, Dr Theressa Millard, Dr Madelon Lips

## 2016-10-14 IMAGING — US US BREAST LTD UNI LEFT INC AXILLA
1 series · 6 of 6 positions shown · non-contrast
Comparison: Previous exam(s).

CLINICAL DATA: 60-year-old female for evaluation of possible left
breast distortion on screening mammogram.

EXAM:
DIGITAL DIAGNOSTIC LEFT MAMMOGRAM WITH 3D TOMOSYNTHESIS
ULTRASOUND LEFT BREAST

[Series 1: us breast ltd uni left inc axilla · 0.07mm/px · 6 of 6 slices shown]
[im 1/6]
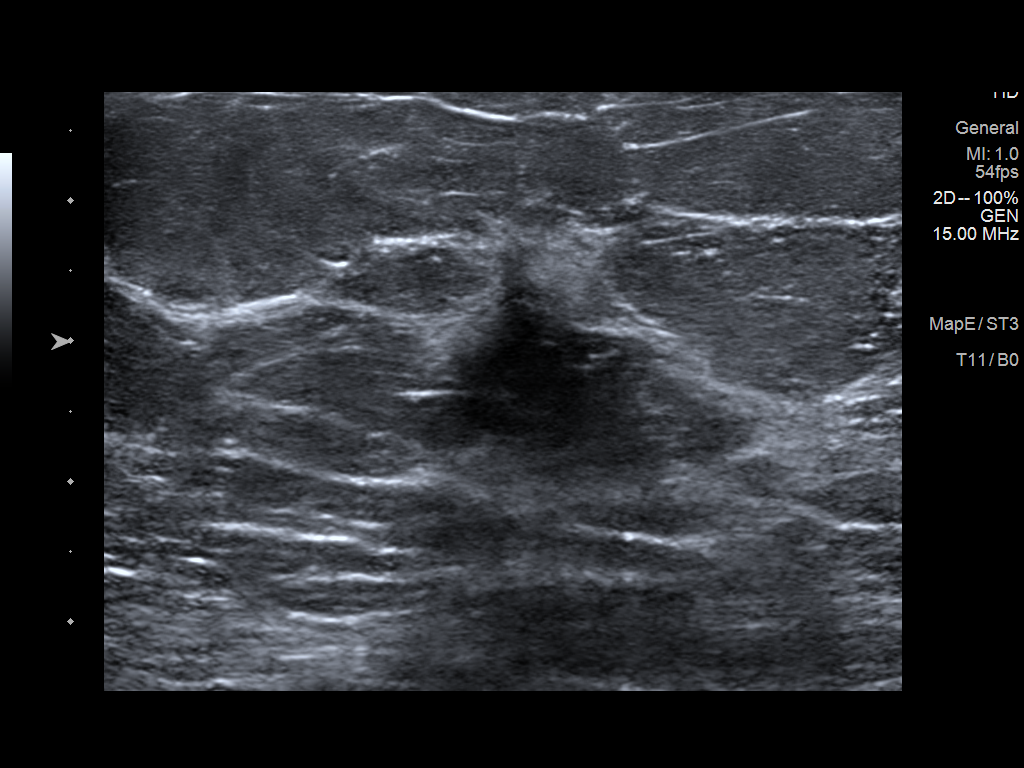
[im 2/6]
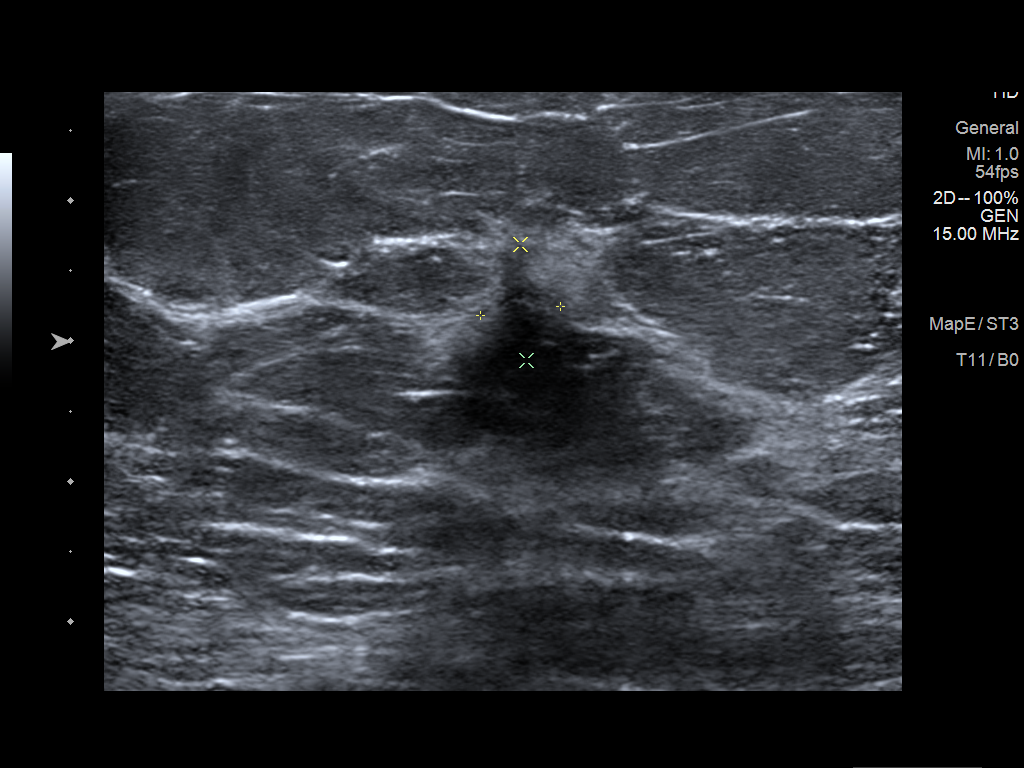
[im 3/6]
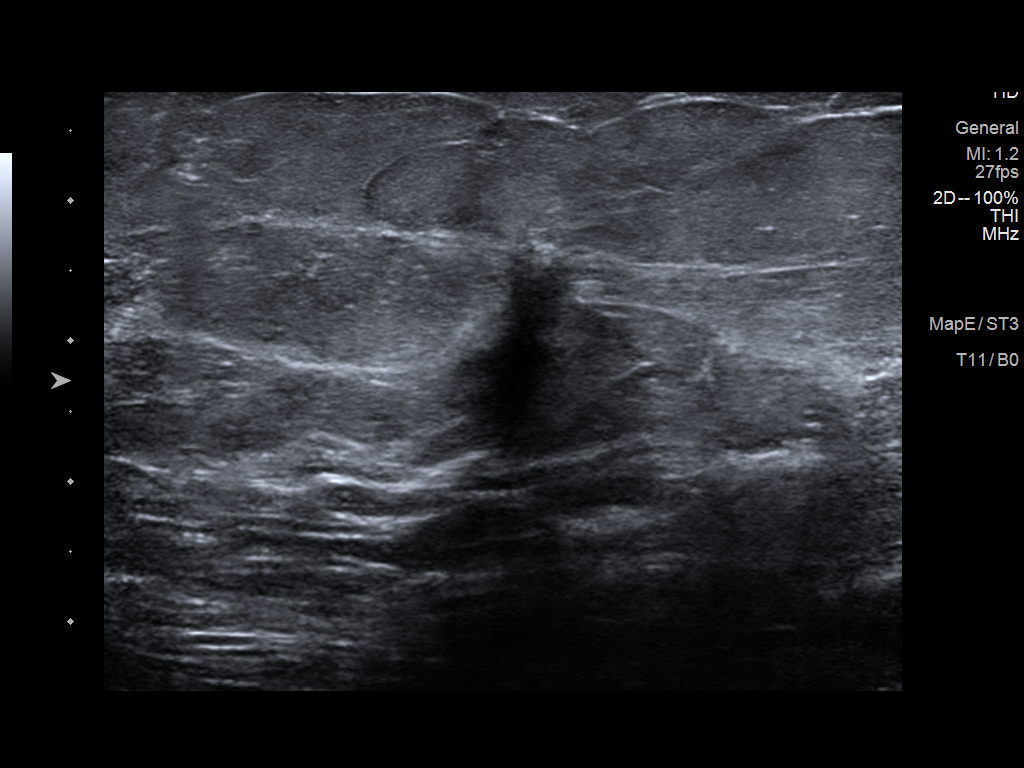
[im 4/6]
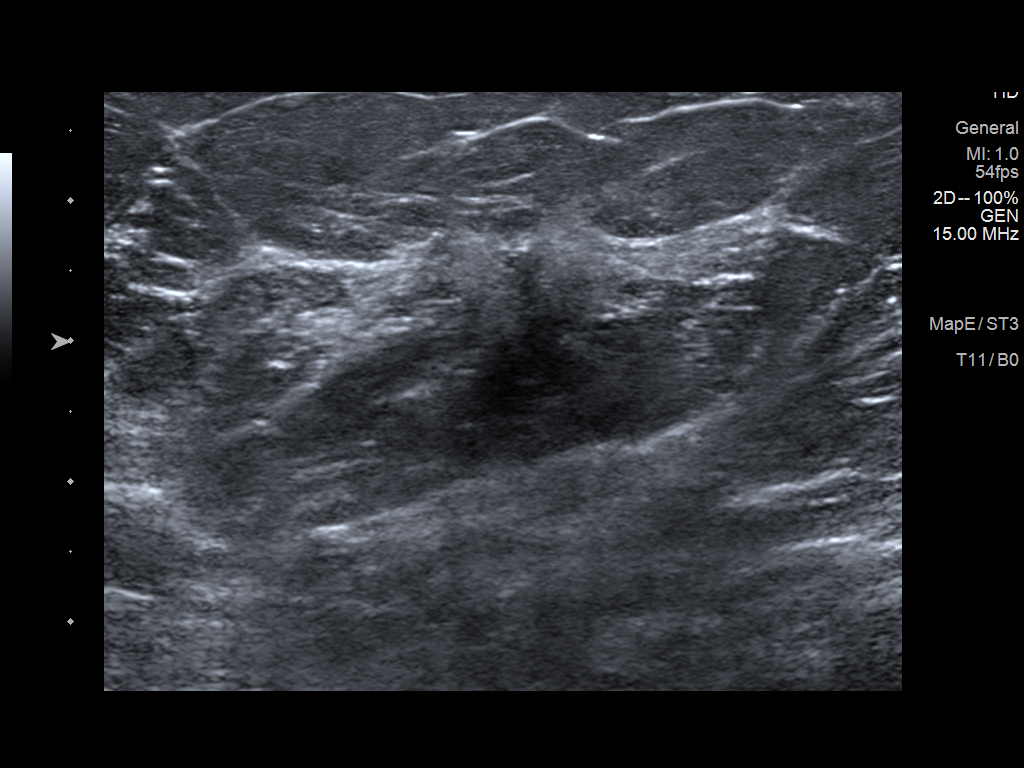
[im 5/6]
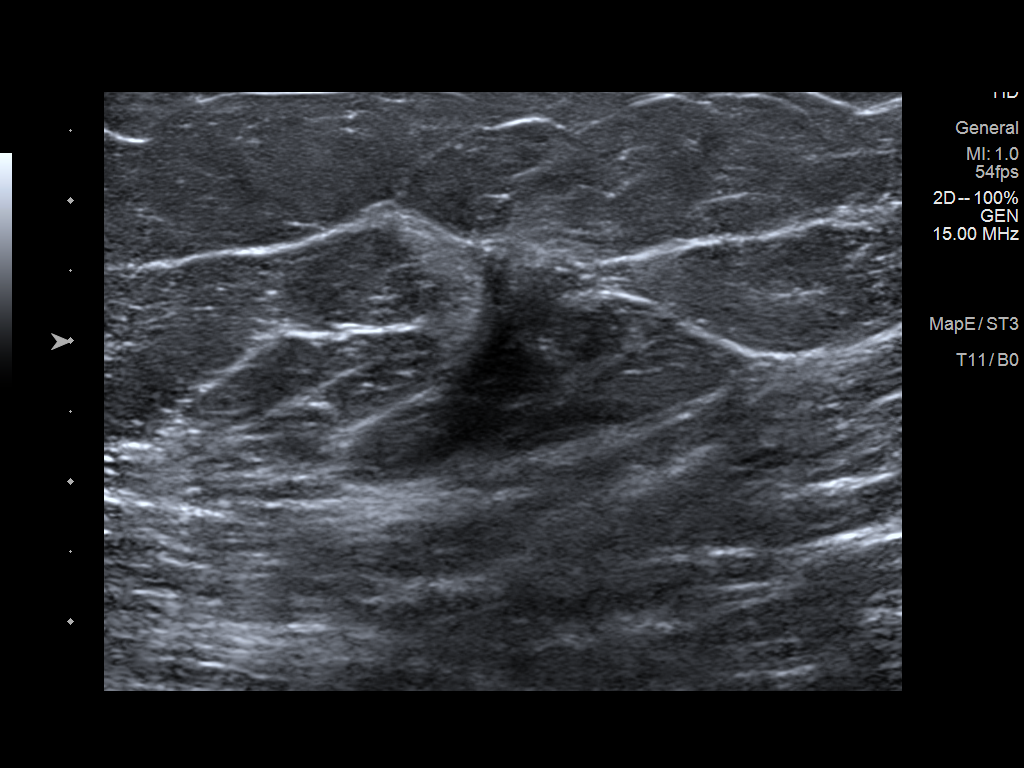
[im 6/6]
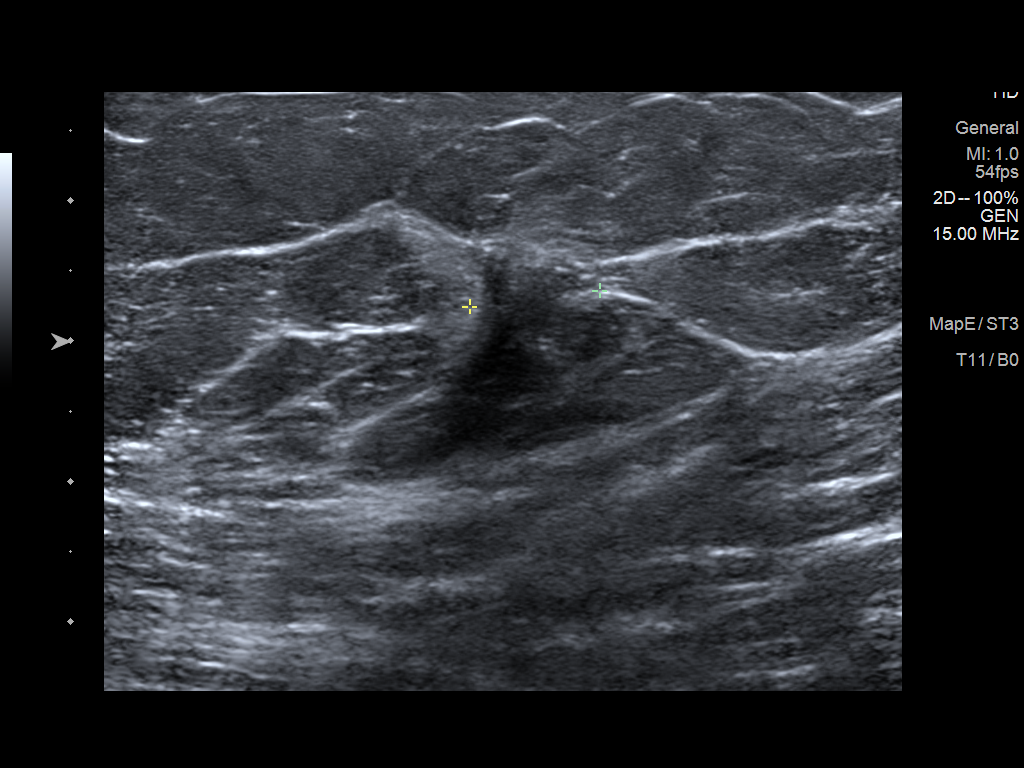

[6 of 6 positions shown; findings below may reference images not displayed]

ACR Breast Density Category b: There are scattered areas of
fibroglandular density.
FINDINGS: 2D and 3D spot compression views of the left breast demonstrate an
area of distortion within the upper left breast.

On physical exam, no palpable abnormalities are identified within
the upper left breast.

Targeted ultrasound is performed, showing a 0.6 x 0.8 x 0.9 cm area
of hypoechoic distortion at the 10 o'clock position of the left
breast 5 cm from the nipple. No abnormal left axillary lymph nodes
are identified.
IMPRESSION: 0.6 x 0.8 x 0.9 cm distortion in the upper inner left breast,
corresponding to the screening study finding. Tissue sampling is
recommended.

RECOMMENDATION:
Ultrasound-guided left breast biopsy, which will be arranged.

I have discussed the findings and recommendations with the patient.
Results were also provided in writing at the conclusion of the
visit. If applicable, a reminder letter will be sent to the patient
regarding the next appointment.

BI-RADS CATEGORY  4: Suspicious.

## 2016-10-20 ENCOUNTER — Encounter (HOSPITAL_BASED_OUTPATIENT_CLINIC_OR_DEPARTMENT_OTHER): Payer: Self-pay | Admitting: *Deleted

## 2016-10-20 IMAGING — US US BREAST BX W LOC DEV 1ST LESION IMG BX SPEC US GUIDE*L*
1 series · 12 of 12 positions shown · non-contrast
Comparison: Previous exam(s).

ADDENDUM:
Pathology revealed GRADE II INVASIVE DUCTAL CARCINOMA WITH
CALCIFICATION, DUCTAL CARCINOMA IN SITU IS PRESENT of the upper
inner quadrant of the Left breast. This was found to be concordant
by Dr. Princess M Bull. Pathology results were discussed with the
patient by telephone. The patient reported doing well after the
biopsy with tenderness at the site. Post biopsy instructions and
care were reviewed and questions were answered. The patient was
encouraged to call The [REDACTED] for any
additional concerns. Surgical consultation has been arranged with
Dr. Che Wah Billie at [REDACTED] on November 03, 2015.

Pathology results reported by Byeongsam Ambuyat, RN on 10/31/2015.
CLINICAL DATA: Left breast mass biopsy
EXAM:
ULTRASOUND GUIDED LEFT BREAST CORE NEEDLE BIOPSY

[Series 1: us breast bx w loc dev 1st lesion img bx spec us g · 0.07mm/px · 12 of 12 slices shown]
[im 1/12]
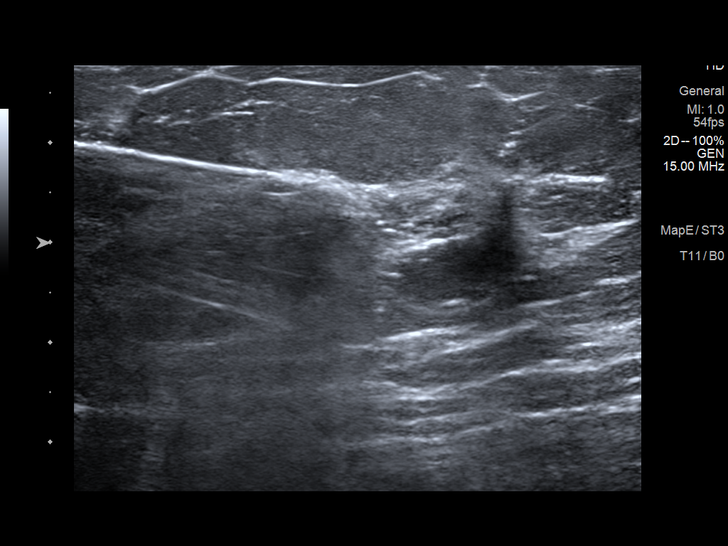
[im 2/12]
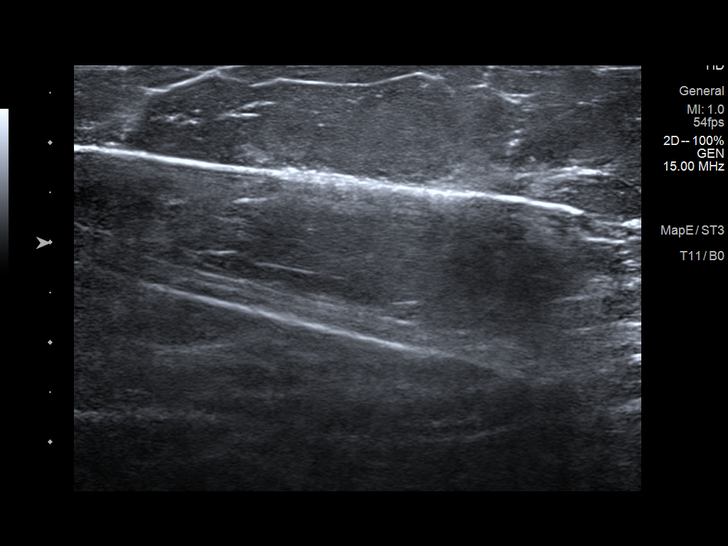
[im 3/12]
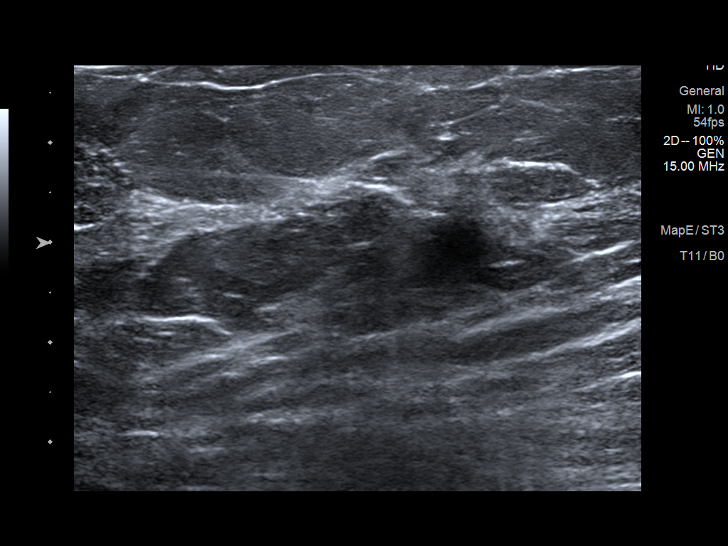
[im 4/12]
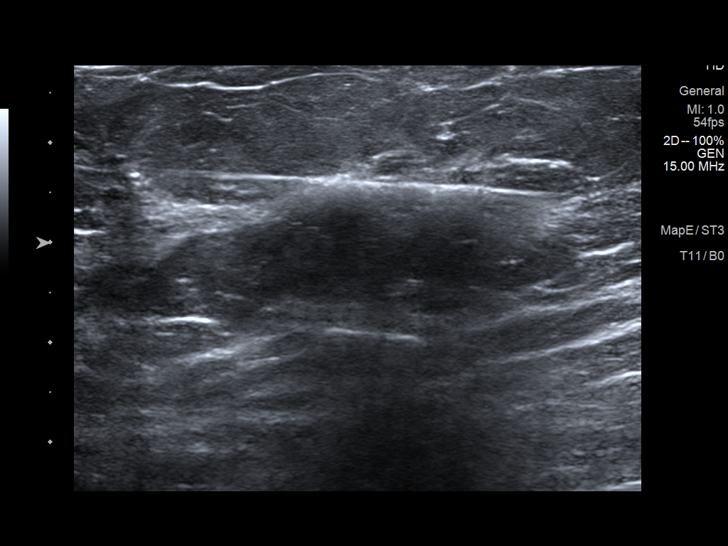
[im 5/12]
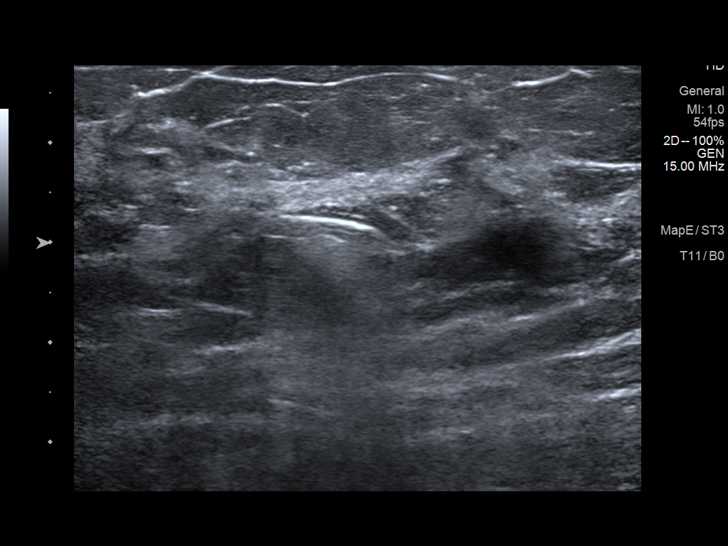
[im 6/12]
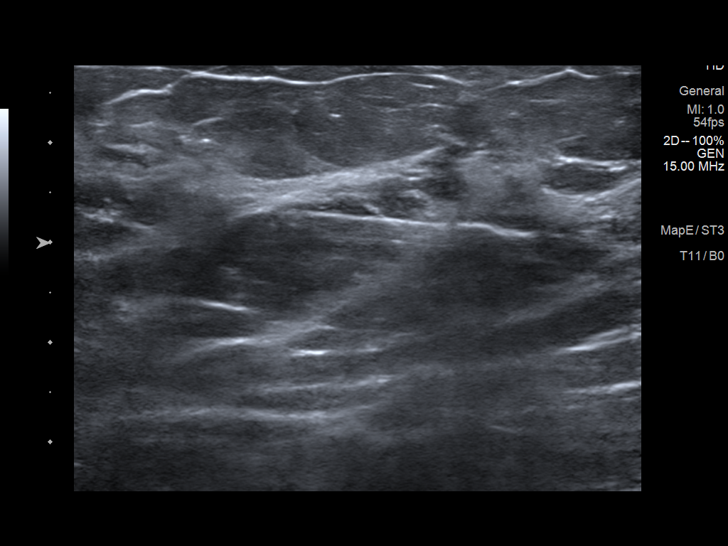
[im 7/12]
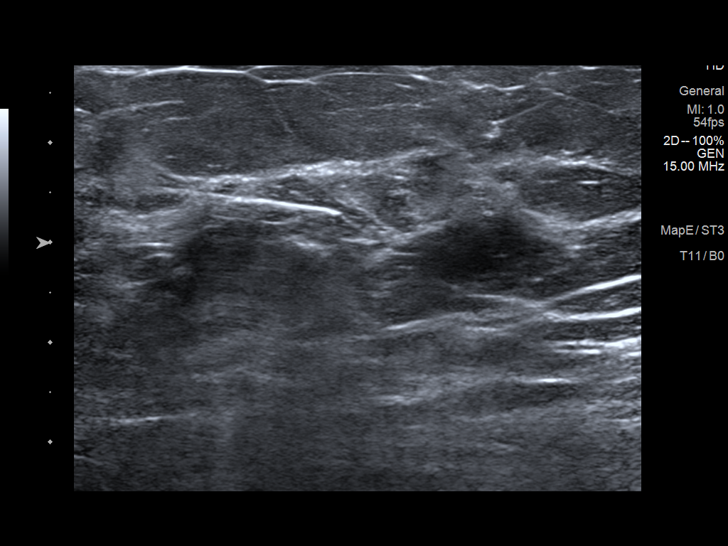
[im 8/12]
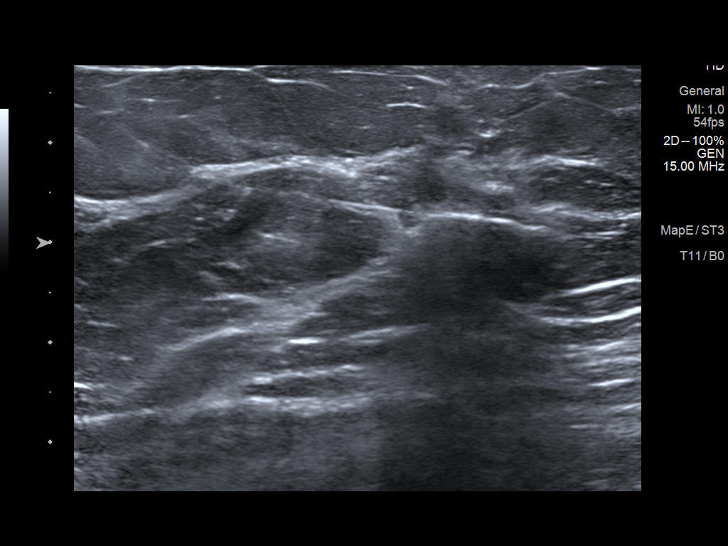
[im 9/12]
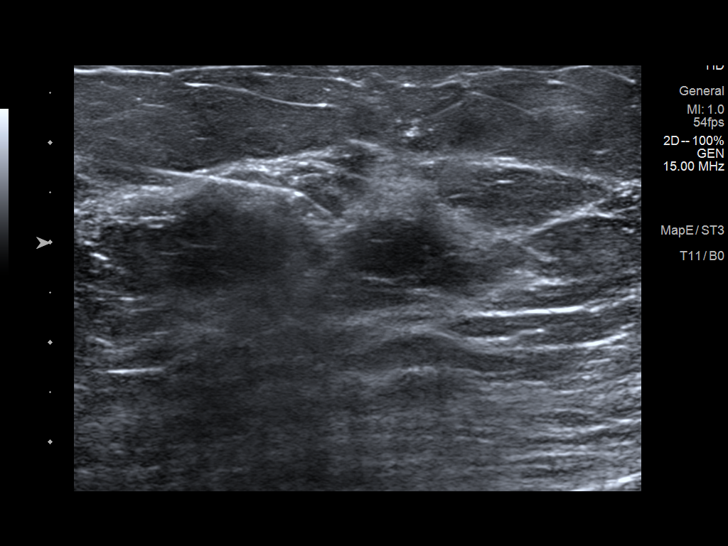
[im 10/12]
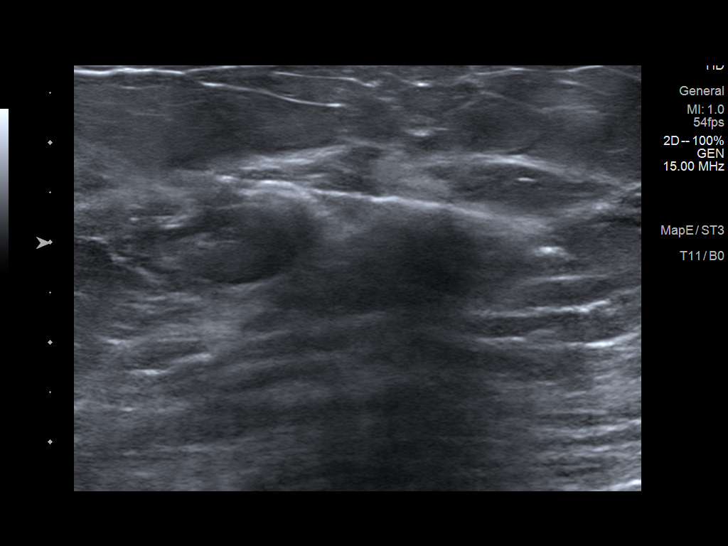
[im 11/12]
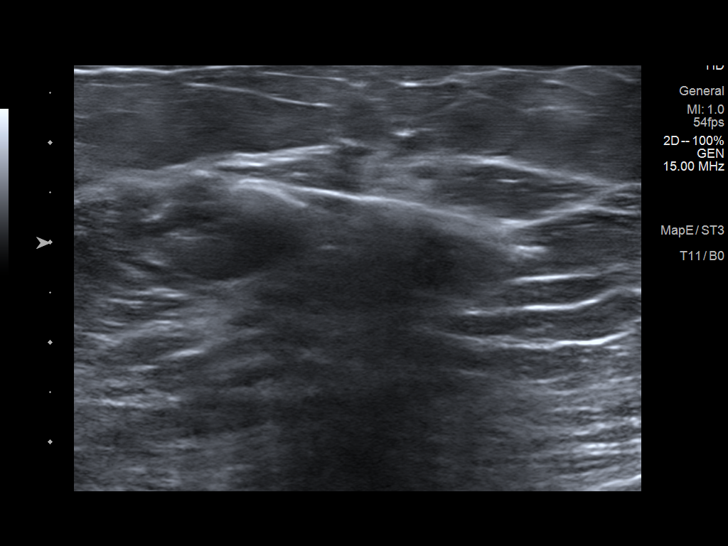
[im 12/12]
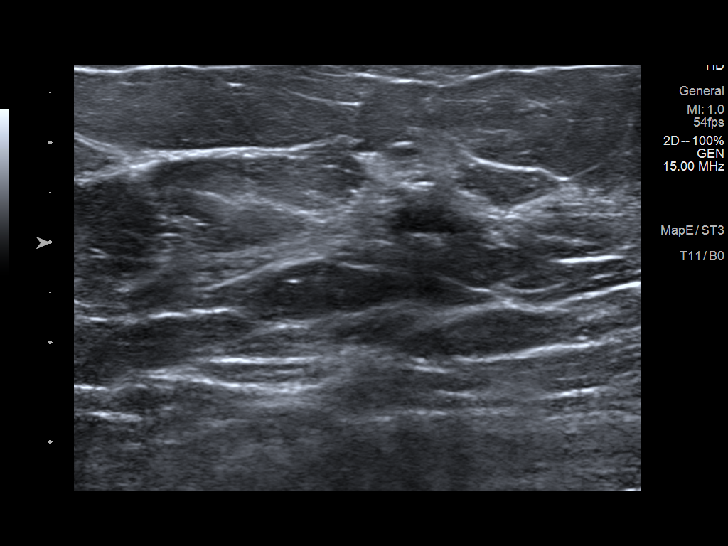

[12 of 12 positions shown; findings below may reference images not displayed]

FINDINGS: I met with the patient and we discussed the procedure of
ultrasound-guided biopsy, including benefits and alternatives. We
discussed the high likelihood of a successful procedure. We
discussed the risks of the procedure, including infection, bleeding,
tissue injury, clip migration, and inadequate sampling. Informed
written consent was given. The usual time-out protocol was performed
immediately prior to the procedure.

Using sterile technique and 1% Lidocaine as local anesthetic, under
direct ultrasound visualization, a 12 gauge Vaque device was
used to perform biopsy of a left breast mass using a lateral
approach. At the conclusion of the procedure a ribbon shaped tissue
marker clip was deployed into the biopsy cavity. Follow up 2 view
mammogram was performed and dictated separately.
IMPRESSION: Ultrasound guided biopsy of a left breast mass. No apparent
complications.

## 2016-10-20 NOTE — Progress Notes (Signed)
To [facility] at 0600  Istat 8 on arrival,Ekg in epic,Instructed Npo after Mn-will take synthroid,claritin,arimidex,valsartin am with water.

## 2016-10-21 DIAGNOSIS — N182 Chronic kidney disease, stage 2 (mild): Secondary | ICD-10-CM | POA: Diagnosis not present

## 2016-10-21 DIAGNOSIS — R399 Unspecified symptoms and signs involving the genitourinary system: Secondary | ICD-10-CM | POA: Diagnosis not present

## 2016-10-21 DIAGNOSIS — N390 Urinary tract infection, site not specified: Secondary | ICD-10-CM | POA: Diagnosis not present

## 2016-10-21 DIAGNOSIS — E119 Type 2 diabetes mellitus without complications: Secondary | ICD-10-CM | POA: Diagnosis not present

## 2016-10-21 DIAGNOSIS — R809 Proteinuria, unspecified: Secondary | ICD-10-CM | POA: Diagnosis not present

## 2016-10-25 DIAGNOSIS — Z933 Colostomy status: Secondary | ICD-10-CM | POA: Diagnosis not present

## 2016-10-26 NOTE — Anesthesia Preprocedure Evaluation (Addendum)
Anesthesia Evaluation  Patient identified by MRN, date of birth, ID band Patient awake    Reviewed: Allergy & Precautions, NPO status , Patient's Chart, lab work & pertinent test results  History of Anesthesia Complications (+) PONV and history of anesthetic complications  Airway Mallampati: II       Dental no notable dental hx.    Pulmonary neg pulmonary ROS,    Pulmonary exam normal        Cardiovascular hypertension, Pt. on medications Normal cardiovascular exam Rhythm:Regular Rate:Normal     Neuro/Psych  Neuromuscular disease negative psych ROS   GI/Hepatic Neg liver ROS, hiatal hernia, GERD  Controlled,  Endo/Other  diabetes, Type 2, Oral Hypoglycemic AgentsHypothyroidism   Renal/GU Renal InsufficiencyRenal disease  negative genitourinary   Musculoskeletal negative musculoskeletal ROS (+)   Abdominal (+) + obese,   Peds negative pediatric ROS (+)  Hematology negative hematology ROS (+) anemia ,   Anesthesia Other Findings Ptosis OD  Reproductive/Obstetrics negative OB ROS                            Lab Results  Component Value Date   WBC 6.0 10/07/2016   HGB 12.6 10/07/2016   HCT 39.8 10/07/2016   MCV 85.4 10/07/2016   PLT 274 10/07/2016   Lab Results  Component Value Date   CREATININE 1.0 10/07/2016   BUN 23.6 10/07/2016   NA 141 10/07/2016   K 4.5 10/07/2016   CL 101 06/30/2016   CO2 30 (H) 10/07/2016   Lab Results  Component Value Date   INR 1.18 12/31/2014   INR 1.22 05/09/2009   12/2015 EKG: normal sinus rhythm.   Anesthesia Physical  Anesthesia Plan  ASA: II  Anesthesia Plan: General   Post-op Pain Management:    Induction: Intravenous  Airway Management Planned: LMA  Additional Equipment:   Intra-op Plan:   Post-operative Plan: Extubation in OR  Informed Consent: I have reviewed the patients History and Physical, chart, labs and discussed  the procedure including the risks, benefits and alternatives for the proposed anesthesia with the patient or authorized representative who has indicated his/her understanding and acceptance.   Dental advisory given  Plan Discussed with: CRNA and Surgeon  Anesthesia Plan Comments:        Anesthesia Quick Evaluation

## 2016-10-27 ENCOUNTER — Ambulatory Visit (HOSPITAL_BASED_OUTPATIENT_CLINIC_OR_DEPARTMENT_OTHER): Payer: BLUE CROSS/BLUE SHIELD | Admitting: Anesthesiology

## 2016-10-27 ENCOUNTER — Ambulatory Visit (HOSPITAL_BASED_OUTPATIENT_CLINIC_OR_DEPARTMENT_OTHER)
Admission: RE | Admit: 2016-10-27 | Discharge: 2016-10-27 | Disposition: A | Discharge status: Home / Self Care | Payer: BLUE CROSS/BLUE SHIELD | Source: Ambulatory Visit | Attending: Urology | Admitting: Urology

## 2016-10-27 ENCOUNTER — Encounter (HOSPITAL_BASED_OUTPATIENT_CLINIC_OR_DEPARTMENT_OTHER)
Admission: RE | Disposition: A | Discharge status: Home / Self Care | Payer: Self-pay | Source: Ambulatory Visit | Attending: Urology

## 2016-10-27 ENCOUNTER — Encounter (HOSPITAL_BASED_OUTPATIENT_CLINIC_OR_DEPARTMENT_OTHER): Payer: Self-pay | Admitting: *Deleted

## 2016-10-27 DIAGNOSIS — Z923 Personal history of irradiation: Secondary | ICD-10-CM | POA: Insufficient documentation

## 2016-10-27 DIAGNOSIS — M25611 Stiffness of right shoulder, not elsewhere classified: Secondary | ICD-10-CM | POA: Diagnosis not present

## 2016-10-27 DIAGNOSIS — Z8041 Family history of malignant neoplasm of ovary: Secondary | ICD-10-CM | POA: Insufficient documentation

## 2016-10-27 DIAGNOSIS — D630 Anemia in neoplastic disease: Secondary | ICD-10-CM | POA: Diagnosis not present

## 2016-10-27 DIAGNOSIS — Z888 Allergy status to other drugs, medicaments and biological substances status: Secondary | ICD-10-CM | POA: Insufficient documentation

## 2016-10-27 DIAGNOSIS — Z833 Family history of diabetes mellitus: Secondary | ICD-10-CM | POA: Insufficient documentation

## 2016-10-27 DIAGNOSIS — E11319 Type 2 diabetes mellitus with unspecified diabetic retinopathy without macular edema: Secondary | ICD-10-CM | POA: Insufficient documentation

## 2016-10-27 DIAGNOSIS — E782 Mixed hyperlipidemia: Secondary | ICD-10-CM | POA: Diagnosis not present

## 2016-10-27 DIAGNOSIS — Z170 Estrogen receptor positive status [ER+]: Secondary | ICD-10-CM | POA: Diagnosis not present

## 2016-10-27 DIAGNOSIS — Z8542 Personal history of malignant neoplasm of other parts of uterus: Secondary | ICD-10-CM | POA: Diagnosis not present

## 2016-10-27 DIAGNOSIS — K219 Gastro-esophageal reflux disease without esophagitis: Secondary | ICD-10-CM | POA: Diagnosis not present

## 2016-10-27 DIAGNOSIS — Z90710 Acquired absence of both cervix and uterus: Secondary | ICD-10-CM | POA: Insufficient documentation

## 2016-10-27 DIAGNOSIS — Z79899 Other long term (current) drug therapy: Secondary | ICD-10-CM | POA: Insufficient documentation

## 2016-10-27 DIAGNOSIS — Z853 Personal history of malignant neoplasm of breast: Secondary | ICD-10-CM | POA: Insufficient documentation

## 2016-10-27 DIAGNOSIS — Z6838 Body mass index (BMI) 38.0-38.9, adult: Secondary | ICD-10-CM | POA: Diagnosis not present

## 2016-10-27 DIAGNOSIS — Z8249 Family history of ischemic heart disease and other diseases of the circulatory system: Secondary | ICD-10-CM | POA: Insufficient documentation

## 2016-10-27 DIAGNOSIS — Z803 Family history of malignant neoplasm of breast: Secondary | ICD-10-CM | POA: Insufficient documentation

## 2016-10-27 DIAGNOSIS — R300 Dysuria: Secondary | ICD-10-CM | POA: Insufficient documentation

## 2016-10-27 DIAGNOSIS — N289 Disorder of kidney and ureter, unspecified: Secondary | ICD-10-CM | POA: Diagnosis not present

## 2016-10-27 DIAGNOSIS — H02401 Unspecified ptosis of right eyelid: Secondary | ICD-10-CM | POA: Insufficient documentation

## 2016-10-27 DIAGNOSIS — D709 Neutropenia, unspecified: Secondary | ICD-10-CM | POA: Diagnosis not present

## 2016-10-27 DIAGNOSIS — Z933 Colostomy status: Secondary | ICD-10-CM | POA: Insufficient documentation

## 2016-10-27 DIAGNOSIS — Z91048 Other nonmedicinal substance allergy status: Secondary | ICD-10-CM | POA: Insufficient documentation

## 2016-10-27 DIAGNOSIS — Z881 Allergy status to other antibiotic agents status: Secondary | ICD-10-CM | POA: Insufficient documentation

## 2016-10-27 DIAGNOSIS — N131 Hydronephrosis with ureteral stricture, not elsewhere classified: Secondary | ICD-10-CM | POA: Diagnosis not present

## 2016-10-27 DIAGNOSIS — Z8543 Personal history of malignant neoplasm of ovary: Secondary | ICD-10-CM | POA: Diagnosis not present

## 2016-10-27 DIAGNOSIS — M25612 Stiffness of left shoulder, not elsewhere classified: Secondary | ICD-10-CM | POA: Insufficient documentation

## 2016-10-27 DIAGNOSIS — E042 Nontoxic multinodular goiter: Secondary | ICD-10-CM | POA: Diagnosis not present

## 2016-10-27 DIAGNOSIS — E119 Type 2 diabetes mellitus without complications: Secondary | ICD-10-CM | POA: Diagnosis not present

## 2016-10-27 DIAGNOSIS — Z9049 Acquired absence of other specified parts of digestive tract: Secondary | ICD-10-CM | POA: Insufficient documentation

## 2016-10-27 DIAGNOSIS — K449 Diaphragmatic hernia without obstruction or gangrene: Secondary | ICD-10-CM | POA: Diagnosis not present

## 2016-10-27 DIAGNOSIS — I10 Essential (primary) hypertension: Secondary | ICD-10-CM | POA: Diagnosis not present

## 2016-10-27 DIAGNOSIS — Z800 Family history of malignant neoplasm of digestive organs: Secondary | ICD-10-CM | POA: Insufficient documentation

## 2016-10-27 DIAGNOSIS — Z882 Allergy status to sulfonamides status: Secondary | ICD-10-CM | POA: Insufficient documentation

## 2016-10-27 DIAGNOSIS — E039 Hypothyroidism, unspecified: Secondary | ICD-10-CM | POA: Insufficient documentation

## 2016-10-27 DIAGNOSIS — Z7984 Long term (current) use of oral hypoglycemic drugs: Secondary | ICD-10-CM | POA: Insufficient documentation

## 2016-10-27 DIAGNOSIS — N135 Crossing vessel and stricture of ureter without hydronephrosis: Secondary | ICD-10-CM | POA: Diagnosis not present

## 2016-10-27 DIAGNOSIS — Z880 Allergy status to penicillin: Secondary | ICD-10-CM | POA: Insufficient documentation

## 2016-10-27 DIAGNOSIS — Z807 Family history of other malignant neoplasms of lymphoid, hematopoietic and related tissues: Secondary | ICD-10-CM | POA: Insufficient documentation

## 2016-10-27 DIAGNOSIS — Z466 Encounter for fitting and adjustment of urinary device: Secondary | ICD-10-CM | POA: Diagnosis not present

## 2016-10-27 DIAGNOSIS — E559 Vitamin D deficiency, unspecified: Secondary | ICD-10-CM | POA: Diagnosis not present

## 2016-10-27 DIAGNOSIS — Z8042 Family history of malignant neoplasm of prostate: Secondary | ICD-10-CM | POA: Insufficient documentation

## 2016-10-27 DIAGNOSIS — N136 Pyonephrosis: Secondary | ICD-10-CM | POA: Diagnosis not present

## 2016-10-27 DIAGNOSIS — Z9012 Acquired absence of left breast and nipple: Secondary | ICD-10-CM | POA: Insufficient documentation

## 2016-10-27 HISTORY — PX: CYSTOSCOPY WITH STENT PLACEMENT: SHX5790

## 2016-10-27 LAB — POCT I-STAT, CHEM 8
BUN: 21 mg/dL — AB (ref 6–20)
CHLORIDE: 103 mmol/L (ref 101–111)
CREATININE: 0.8 mg/dL (ref 0.44–1.00)
Calcium, Ion: 1.26 mmol/L (ref 1.15–1.40)
Glucose, Bld: 110 mg/dL — ABNORMAL HIGH (ref 65–99)
HEMATOCRIT: 38 % (ref 36.0–46.0)
HEMOGLOBIN: 12.9 g/dL (ref 12.0–15.0)
POTASSIUM: 3.5 mmol/L (ref 3.5–5.1)
Sodium: 142 mmol/L (ref 135–145)
TCO2: 26 mmol/L (ref 0–100)

## 2016-10-27 LAB — GLUCOSE, CAPILLARY: Glucose-Capillary: 110 mg/dL — ABNORMAL HIGH (ref 65–99)

## 2016-10-27 SURGERY — CYSTOSCOPY, WITH STENT INSERTION
Anesthesia: General | Laterality: Right

## 2016-10-27 MED ORDER — GENTAMICIN SULFATE 40 MG/ML IJ SOLN
5.0000 mg/kg | INTRAVENOUS | Status: AC
  Administered 2016-10-27: 340 mg via INTRAVENOUS
  Filled 2016-10-27: qty 8.5

## 2016-10-27 MED ORDER — ONDANSETRON HCL 4 MG/2ML IJ SOLN
INTRAMUSCULAR | Status: DC | PRN
  Administered 2016-10-27: 4 mg via INTRAVENOUS

## 2016-10-27 MED ORDER — FENTANYL CITRATE (PF) 100 MCG/2ML IJ SOLN
INTRAMUSCULAR | Status: AC
  Filled 2016-10-27: qty 2

## 2016-10-27 MED ORDER — MEPERIDINE HCL 25 MG/ML IJ SOLN
6.2500 mg | INTRAMUSCULAR | Status: DC | PRN
  Filled 2016-10-27: qty 1

## 2016-10-27 MED ORDER — LIDOCAINE 2% (20 MG/ML) 5 ML SYRINGE
INTRAMUSCULAR | Status: AC
  Filled 2016-10-27: qty 5

## 2016-10-27 MED ORDER — PROPOFOL 10 MG/ML IV BOLUS
INTRAVENOUS | Status: DC | PRN
  Administered 2016-10-27: 160 mg via INTRAVENOUS

## 2016-10-27 MED ORDER — ACETAMINOPHEN 160 MG/5ML PO SOLN
325.0000 mg | ORAL | Status: DC | PRN
  Filled 2016-10-27: qty 20.3

## 2016-10-27 MED ORDER — MIDAZOLAM HCL 2 MG/2ML IJ SOLN
INTRAMUSCULAR | Status: AC
  Filled 2016-10-27: qty 2

## 2016-10-27 MED ORDER — IOHEXOL 300 MG/ML  SOLN
INTRAMUSCULAR | Status: DC | PRN
  Administered 2016-10-27: 7 mL via URETHRAL

## 2016-10-27 MED ORDER — ONDANSETRON HCL 4 MG/2ML IJ SOLN
4.0000 mg | Freq: Once | INTRAMUSCULAR | Status: DC | PRN
  Filled 2016-10-27: qty 2

## 2016-10-27 MED ORDER — MIDAZOLAM HCL 5 MG/5ML IJ SOLN
INTRAMUSCULAR | Status: DC | PRN
  Administered 2016-10-27 (×2): 1 mg via INTRAVENOUS

## 2016-10-27 MED ORDER — SCOPOLAMINE 1 MG/3DAYS TD PT72
MEDICATED_PATCH | TRANSDERMAL | Status: AC
  Filled 2016-10-27: qty 1

## 2016-10-27 MED ORDER — ACETAMINOPHEN 325 MG PO TABS
325.0000 mg | ORAL_TABLET | ORAL | Status: DC | PRN
  Filled 2016-10-27: qty 2

## 2016-10-27 MED ORDER — FENTANYL CITRATE (PF) 100 MCG/2ML IJ SOLN
INTRAMUSCULAR | Status: DC | PRN
Start: 2016-10-27 — End: 2016-10-27
  Administered 2016-10-27: 50 ug via INTRAVENOUS

## 2016-10-27 MED ORDER — PROPOFOL 10 MG/ML IV BOLUS
INTRAVENOUS | Status: AC
  Filled 2016-10-27: qty 20

## 2016-10-27 MED ORDER — LACTATED RINGERS IV SOLN
INTRAVENOUS | Status: DC
  Administered 2016-10-27: 07:00:00 via INTRAVENOUS
  Filled 2016-10-27: qty 1000

## 2016-10-27 MED ORDER — SCOPOLAMINE 1 MG/3DAYS TD PT72
1.0000 | MEDICATED_PATCH | Freq: Once | TRANSDERMAL | Status: AC
  Administered 2016-10-27: 1 via TRANSDERMAL
  Filled 2016-10-27: qty 1

## 2016-10-27 MED ORDER — FENTANYL CITRATE (PF) 100 MCG/2ML IJ SOLN
25.0000 ug | INTRAMUSCULAR | Status: DC | PRN
  Filled 2016-10-27: qty 1

## 2016-10-27 MED ORDER — LIDOCAINE 2% (20 MG/ML) 5 ML SYRINGE
INTRAMUSCULAR | Status: DC | PRN
  Administered 2016-10-27: 80 mg via INTRAVENOUS

## 2016-10-27 MED ORDER — GENTAMICIN IN SALINE 1.6-0.9 MG/ML-% IV SOLN
80.0000 mg | INTRAVENOUS | Status: DC
  Filled 2016-10-27: qty 50

## 2016-10-27 MED ORDER — KETOROLAC TROMETHAMINE 30 MG/ML IJ SOLN
30.0000 mg | Freq: Once | INTRAMUSCULAR | Status: DC
  Filled 2016-10-27: qty 1

## 2016-10-27 SURGICAL SUPPLY — 20 items
BAG DRAIN URO-CYSTO SKYTR STRL (DRAIN) ×2 IMPLANT
CATH INTERMIT  6FR 70CM (CATHETERS) IMPLANT
CLOTH BEACON ORANGE TIMEOUT ST (SAFETY) ×2 IMPLANT
GLOVE BIO SURGEON STRL SZ7.5 (GLOVE) ×2 IMPLANT
GOWN STRL REUS W/ TWL LRG LVL3 (GOWN DISPOSABLE) ×2 IMPLANT
GOWN STRL REUS W/TWL LRG LVL3 (GOWN DISPOSABLE) ×2
GUIDEWIRE ANG ZIPWIRE 038X150 (WIRE) IMPLANT
GUIDEWIRE STR DUAL SENSOR (WIRE) ×2 IMPLANT
IV NS 1000ML (IV SOLUTION)
IV NS 1000ML BAXH (IV SOLUTION) IMPLANT
IV NS IRRIG 3000ML ARTHROMATIC (IV SOLUTION) IMPLANT
KIT RM TURNOVER CYSTO AR (KITS) ×2 IMPLANT
MANIFOLD NEPTUNE II (INSTRUMENTS) IMPLANT
NS IRRIG 500ML POUR BTL (IV SOLUTION) IMPLANT
PACK CYSTO (CUSTOM PROCEDURE TRAY) ×2 IMPLANT
STENT POLARIS LOOP 6FR X 24 CM (STENTS) ×2 IMPLANT
SYRINGE 10CC LL (SYRINGE) ×2 IMPLANT
TUBE CONNECTING 12X1/4 (SUCTIONS) IMPLANT
TUBE FEEDING 8FR 16IN STR KANG (MISCELLANEOUS) IMPLANT
WATER STERILE IRR 3000ML UROMA (IV SOLUTION) ×2 IMPLANT

## 2016-10-27 NOTE — Transfer of Care (Signed)
Immediate Anesthesia Transfer of Care Note  Patient: [facility]  Procedure(s) Performed: Procedure(s) (LRB): CYSTOSCOPY WITH STENT CHANGE and right retrograde pyelogram (Right)  Patient Location: PACU  Anesthesia Type: General  Level of Consciousness: awake, oriented, sedated and patient cooperative  Airway & Oxygen Therapy: Patient Spontanous Breathing and Patient connected to face mask oxygen  Post-op Assessment: Report given to PACU RN and Post -op Vital signs reviewed and stable  Post vital signs: Reviewed and stable  Complications: No apparent anesthesia complications  [id number] [id number] BP-121/53 Resp-16

## 2016-10-27 NOTE — Anesthesia Postprocedure Evaluation (Addendum)
Anesthesia Post Note  Patient: Financial planner  Procedure(s) Performed: Procedure(s) (LRB): CYSTOSCOPY WITH STENT CHANGE and right retrograde pyelogram (Right)  Patient location during evaluation: PACU Anesthesia Type: General Level of consciousness: awake Pain management: pain level controlled Vital Signs Assessment: post-procedure vital signs reviewed and stable Respiratory status: spontaneous breathing Cardiovascular status: stable Postop Assessment: no signs of nausea or vomiting Anesthetic complications: no        Last Vitals:  Vitals:   10/27/16 0820 10/27/16 0825  BP:    Pulse: 79 81  Resp: 14 17  Temp:      Last Pain:  Vitals:   10/27/16 0606  TempSrc: Oral   Pain Goal: Patients Stated Pain Goal: 7 (10/27/16 0623)               [name],[name]

## 2016-10-27 NOTE — Discharge Instructions (Signed)
1 - You may have urinary urgency (bladder spasms) and bloody urine on / off with stent in place. This is normal.  2 - Call MD or go to ER for fever >102, severe pain / nausea / vomiting not relieved by medications, or acute change in medical status  Call your surgeon if you experience:   1.  Fever over 101.0. 2.  Inability to urinate. 3.  Nausea and/or vomiting. 4.  Any problems and/or concerns  Post Anesthesia Home Care Instructions  Activity: Get plenty of rest for the remainder of the day. A responsible individual must stay with you for 24 hours following the procedure.  For the next 24 hours, DO NOT: -Drive a car -Paediatric nurse -Drink alcoholic beverages -Take any medication unless instructed by your physician -Make any legal decisions or sign important papers.  Meals: Start with liquid foods such as gelatin or soup. Progress to regular foods as tolerated. Avoid greasy, spicy, heavy foods. If nausea and/or vomiting occur, drink only clear liquids until the nausea and/or vomiting subsides. Call your physician if vomiting continues.  Special Instructions/Symptoms: Your throat may feel dry or sore from the anesthesia or the breathing tube placed in your throat during surgery. If this causes discomfort, gargle with warm salt water. The discomfort should disappear within 24 hours.  If you had a scopolamine patch placed behind your ear for the management of post- operative nausea and/or vomiting:  1. The medication in the patch is effective for 72 hours, after which it should be removed.  Wrap patch in a tissue and discard in the trash. Wash hands thoroughly with soap and water. 2. You may remove the patch earlier than 72 hours if you experience unpleasant side effects which may include dry mouth, dizziness or visual disturbances. 3. Avoid touching the patch. Wash your hands with soap and water after contact with the patch.

## 2016-10-27 NOTE — Op Note (Signed)
NAME:  ALITHEA, LAPAGE                    ACCOUNT NO.:  MEDICAL RECORD NO.:  [id number]  LOCATION:                                 FACILITY:  PHYSICIAN:  Alexis Frock, MD          DATE OF BIRTH:  DATE OF PROCEDURE:  10/27/2016                               OPERATIVE REPORT   DIAGNOSES:  Right chronic ureteral stricture, history of endometrial cancer, breast cancer, and radiation.  PROCEDURE: 1. Cystoscopy, right retrograde pyelogram interpretation. 2. Exchange of right ureteral stent, 6 x 24 Polaris, no tether.  ESTIMATED BLOOD LOSS:  Nil.  COMPLICATION:  None.  SPECIMEN:  In situ right ureteral stent for discard.  FINDINGS: 1. Moderately encrusted distal end of right ureteral stent, as     anticipated. 2. Fixed narrowing of the right mid ureter consistent with known     stricture, numerous surgical clips in the area by fluoroscopy. 3. Successful replacement of right ureteral stent, proximal end in the     renal pelvis and distal end in the urinary bladder.  INDICATION:  Ms. Heleger is a very pleasant, but unfortunate 61 year old lady with history of multifocal prior gynecologic cancer including endometrial cancer as well as breast cancer.  She is status post local resection and radiation for her pelvic malignancy.  She has developed a chronic right ureteral stricture as a result of this and some mild renal insufficiency.  Given her extensive surgical history and recent active malignancy, she has been managed with chronic stenting changed q.3-4 months as she does have a propensity to encrust her stents.  Her most recent stent change was approximately 4 months ago and she is certainly due for exchange.  Informed consent was signed and placed in the medical record.  PROCEDURE IN DETAIL:  The patient being [facility], was verified. Procedure being right ureteral stent change confirmed.  Procedure was carried out.  Time out was performed.  Intravenous  antibiotics administered.  General LMA anesthesia introduced.  The patient was placed into a low lithotomy position.  Sterile field was created by prepping and draping the patient's vagina, introitus, and proximal thighs using iodine.  Next, cystourethroscopy was performed using a rigid cystoscope with offset lens.  Inspection of bladder revealed distal end of the ureteral stent in situ.  This was moderately encrusted.  It was grasped, brought out in its entirety, set aside for discard.  Only the area exposed the bladder was encrusted.  Next, the open-ended catheter 6-French type was used to cannulate the right ureteral orifice.  A right retrograde pyelogram was obtained.  Right retrograde pyelogram demonstrated a single right ureter with single-system right kidney.  There was significant narrowing that was quite fixed in the area of the mid and distal ureter.  There were numerous surgical clips overlying this area consistent with known stricture.  A 0.038 Zip wire was advanced to the level of the upper pole.  The open-ended catheter was advanced a bit further.  Additional retrograde pyelography revealed minimal hydronephrosis, this confirming that her in-situ stent had been allowing decompression.  The Sensor wire was once again advanced at the level of upper  pole, a new 6 x 24 Polaris- type stent was placed using cystoscopic and fluoroscopic guidance.  Good proximal and distal deployment were noted.  Bladder was emptied per cystoscope, procedure was then terminated.  The patient tolerated the procedure well.  There were no immediate periprocedural complications. The patient was taken to the postanesthesia care unit in stable condition.          ______________________________ Alexis Frock, MD     TM/MEDQ  D:  10/27/2016  T:  10/27/2016  Job:  [id number]

## 2016-10-27 NOTE — Brief Op Note (Signed)
10/27/2016  7:51 AM  PATIENT:  [name]  61 y.o. female  PRE-OPERATIVE DIAGNOSIS:  RIGHT URETERAL STRICTURE  POST-OPERATIVE DIAGNOSIS:  * No post-op diagnosis entered *  PROCEDURE:  Procedure(s): CYSTOSCOPY WITH STENT CHANGE and right retrograde pyelogram (Right)  SURGEON:  Surgeon(s) and Role:    * Alexis Frock, MD - Primary  PHYSICIAN ASSISTANT:   ASSISTANTS: none   ANESTHESIA:   general  EBL:  Total I/O In: 200 [I.V.:200] Out: 0   BLOOD ADMINISTERED:none  DRAINS: none   LOCAL MEDICATIONS USED:  NONE  SPECIMEN:  Source of Specimen:  Rt ureteral stent  DISPOSITION OF SPECIMEN:  discard  COUNTS:  YES  TOURNIQUET:  * No tourniquets in log *  DICTATION: .Other Dictation: Dictation Number [id number]  PLAN OF CARE: Discharge to home after PACU  PATIENT DISPOSITION:  PACU - hemodynamically stable.   Delay start of Pharmacological VTE agent (>24hrs) due to surgical blood loss or risk of bleeding: yes

## 2016-10-27 NOTE — Anesthesia Procedure Notes (Signed)
Procedure Name: LMA Insertion Date/Time: 10/27/2016 7:29 AM Performed by: Denna Haggard D Pre-anesthesia Checklist: Patient identified, Emergency Drugs available, Suction available and Patient being monitored Patient Re-evaluated:Patient Re-evaluated prior to inductionOxygen Delivery Method: Circle system utilized Preoxygenation: Pre-oxygenation with 100% oxygen Intubation Type: IV induction Ventilation: Mask ventilation without difficulty LMA: LMA inserted LMA Size: 4.0 Number of attempts: 1 Airway Equipment and Method: Bite block Placement Confirmation: positive ETCO2 Tube secured with: Tape Dental Injury: Teeth and Oropharynx as per pre-operative assessment

## 2016-10-27 NOTE — H&P (Signed)
[name] is an 61 y.o. female.    Chief Complaint: Pre-op RIGHT ureteral stent exchange  HPI:    Right Ureteral Stricture - New Rt hydro by CT 03/2015 to distal ureter on eval flank pain.    Recent Course:   03/2015 - Operative cysto / ureteroscopy c/w likley 2-3cm stricture (distal end about 3cm proximal to UO, balloon to 29F, 8x24 JJ stent placed)   04/2015 - Ureteroscopy patent ureter, JJ stent removed   08/2015 - CT Rt hydro to distal ureter (mildly increased) no flank pain, Cr 1.2 ==> Rt 5x24 polaris stent 09/2015   03/2016 - Rt 6x24 polaris (moderately encrusted); 06/2016 Rt 6x24 polaris (mild encrusted)     PMH sig for DM2, morbid obesity, breast cancer, ovarian cancer, endometrial cancer. Her PCP is [name].    Today "[name]" is seen to proceed with right ureteral stent exchange. Most recent UCX negative.    Past Medical History:  Diagnosis Date  . Anemia in neoplastic disease   . Benign essential HTN   . 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 Cancer of corpus uteri, except isthmus [facility]) dx 10-15-2004 oncologist-- dr Denman [name] and dr Alvy Bimler     dx endometrial and ovarian cancer s/p  chemotheapy and surgery:  recurrent 11-19-2014 w/ radiation 01-29-2015 to 03-10-2015  . Chronic idiopathic neutropenia (HCC)    presumed related to chemotherapy March 2006--- followed by dr Alvy Bimler   . Complication of anesthesia    PONV  . Diabetic retinopathy, background ([place])   . DM type 2 (diabetes mellitus, type 2) ([place])    monitored by dr Legrand Como altheimer  . Dysuria   . GERD (gastroesophageal reflux disease)    takes omeprazole  . Hiatal hernia   . History of bronchitis   . History of gastric polyp    2014  duodenum  . History of radiation therapy    01-29-2015 to 03-10-2015  pelvis  50.4Gy  . Hypothyroidism    monitored by dr Legrand Como altheimer  . Mixed dyslipidemia   . Multiple thyroid nodules    Managed by Dr. Harlow Asa  . PONV (postoperative nausea and vomiting)    "scopolamine patch works for me"  . Radiation-induced dermatitis    contact dermatitis , radiation completed, rash only on ankles now.  . Right flank pain   . S/P colostomy ([place])   . Seasonal allergies   . Shoulder stiffness    bilateral   . Ureteral stricture, right UROLOGIT-  DR [facility]   CHRONIC--  TREATMENT URETERAL STENT  . Vitamin D deficiency   . Wears glasses     Past Surgical History:  Procedure Laterality Date  . APPENDECTOMY    . BREAST RECONSTRUCTION WITH PLACEMENT OF TISSUE EXPANDER AND FLEX HD (ACELLULAR HYDRATED DERMIS) Bilateral 12/11/2015   Procedure: BILATERAL BREAST RECONSTRUCTION WITH PLACEMENT OF TISSUE EXPANDERS;  Surgeon: Irene Limbo, MD;  Location: [place];  Service: Plastics;  Laterality: Bilateral;  . COLONOSCOPY WITH PROPOFOL N/A 08/21/2013   Procedure: COLONOSCOPY WITH PROPOFOL;  Surgeon: Cleotis Nipper, MD;  Location: [facility];  Service: Endoscopy;  Laterality: N/A;  . COLOSTOMY TAKEDOWN N/A 12/04/2014   Procedure: LAPROSCOPIC LYSIS OF ADHESIONS, SPLENIC MOBILIZATION, RELOCATION OF COLOSTOMY, DEBRIDEMENT INITIAL COLOSTOMY SITE;  Surgeon: Michael [name], MD;  Location: [facility];  Service: General;  Laterality:  N/A;  . CYSTOSCOPY W/ RETROGRADES Right 11/21/2015   Procedure: CYSTOSCOPY WITH RETROGRADE PYELOGRAM;  Surgeon: Alexis Frock, MD;  [facility];  Service: Urology;  Laterality: Right;  . CYSTOSCOPY W/ URETERAL STENT PLACEMENT Right 11/21/2015   Procedure: CYSTOSCOPY WITH STENT REPLACEMENT;  Surgeon: Alexis Frock, MD;  [facility];  Service: Urology;  Laterality: Right;  . CYSTOSCOPY W/ URETERAL STENT PLACEMENT Right 03/10/2016   Procedure: CYSTOSCOPY WITH STENT REPLACEMENT;  Surgeon: Alexis Frock, MD;  Location[facility];  Service:  Urology;  Laterality: Right;  . CYSTOSCOPY W/ URETERAL STENT PLACEMENT Right 06/30/2016   Procedure: CYSTOSCOPY WITH RETROGRADE PYELOGRAM/URETERAL STENT EXCHANGE;  Surgeon: Alexis Frock, MD;  Location: [facility];  Service: Urology;  Laterality: Right;  . CYSTOSCOPY WITH RETROGRADE PYELOGRAM, URETEROSCOPY AND STENT PLACEMENT Right 03/20/2015   Procedure: CYSTOSCOPY WITH RETROGRADE PYELOGRAM, URETEROSCOPY WITH BALLOON DILATION AND STENT PLACEMENT ON RIGHT;  Surgeon: Alexis Frock, MD;  Location: [facility];  Service: Urology;  Laterality: Right;  . CYSTOSCOPY WITH RETROGRADE PYELOGRAM, URETEROSCOPY AND STENT PLACEMENT Right 05/02/2015   Procedure: CYSTOSCOPY WITH RIGHT RETROGRADE PYELOGRAM,  DIAGNOSTIC URETEROSCOPY AND STENT PULL ;  Surgeon: Alexis Frock, MD;  Location: [facility];  Service: Urology;  Laterality: Right;  . CYSTOSCOPY WITH RETROGRADE PYELOGRAM, URETEROSCOPY AND STENT PLACEMENT Right 09/05/2015   Procedure: CYSTOSCOPY WITH RETROGRADE PYELOGRAM,  AND STENT PLACEMENT;  Surgeon: Alexis Frock, MD;  [facility];  Service: Urology;  Laterality: Right;  . EUS N/A 10/02/2014   Procedure: LOWER ENDOSCOPIC ULTRASOUND (EUS);  Surgeon: Arta Silence, MD;  Location: [facility];  Service: Endoscopy;  Laterality: N/A;  . EXCISION SOFT TISSUE MASS RIGHT FOREMAN  12-08-2006  . EYE SURGERY  as child   pytosis of eyelids repair  . INCISION AND DRAINAGE OF WOUND Bilateral 12/26/2015   Procedure: DEBRIDEMENT OF BILATERAL MASTECTOMY FLAPS;  Surgeon: Irene Limbo, MD;  Location: [place];  Service: Plastics;  Laterality: Bilateral;  . LAPAROSCOPIC CHOLECYSTECTOMY  1990  . LIPOSUCTION WITH LIPOFILLING Bilateral 04/16/2016   Procedure: LIPOSUCTION WITH LIPOFILLING TO BILATERAL CHEST;  Surgeon: Irene Limbo, MD;  Location: [place];  Service: Plastics;  Laterality: Bilateral;  . MASTECTOMY Right 12/11/2015    PROPHYLACTIC   . MASTECTOMY COMPLETE / SIMPLE W/ SENTINEL NODE BIOPSY Left 12/11/2015   AXILLARY SENTINEL LYMPH NODE BIOPSY   . MASTECTOMY W/ SENTINEL NODE BIOPSY Bilateral 12/11/2015   Procedure: RIGHT PROPHYLACTIC MASTECTOMY, LEFT TOTAL MASTECTOMY WITH LEFT AXILLARY SENTINEL LYMPH NODE BIOPSY;  Surgeon: Stark Klein, MD;  Location: [place];  Service: General;  Laterality: Bilateral;  . OSTOMY N/A 11/19/2014   Procedure: OSTOMY;  Surgeon: Michael [name], MD;  [facility];  Service: General;  Laterality: N/A;  . REMOVAL OF BILATERAL TISSUE EXPANDERS WITH PLACEMENT OF BILATERAL BREAST IMPLANTS Bilateral 04/16/2016   Procedure: REMOVAL OF BILATERAL TISSUE EXPANDERS WITH PLACEMENT OF BILATERAL BREAST IMPLANTS;  Surgeon: Irene Limbo, MD;  Location: [place];  Service: Plastics;  Laterality: Bilateral;  . ROBOTIC ASSISTED LAP VAGINAL HYSTERECTOMY N/A 11/19/2014   Procedure: ROBOTIC LYSIS OF ADHESIONS, CONVERTED TO LAPAROTOMY RADICAL UPPER VAGINECTOMY,LOW ANTERIOR BOWEL RESECTION, COLOSTOMY, BILATERAL URETERAL STENT PLACEMENT AND CYSTONOMY CLOSURE;  Surgeon: Everitt Amber, MD;  Location[facility];  Service: Gynecology;  Laterality: N/A;  . TISSUE EXPANDER FILLING Bilateral 12/26/2015   Procedure: EXPANSION OF BILATERAL CHEST TISSUE EXPANDERS (60 mL- Right; 75 mL- Left);  Surgeon: Irene Limbo, MD;  Location: [facility];  Service: Clinical cytogeneticist;  Laterality: Bilateral;  . TONSILLECTOMY    . TOTAL ABDOMINAL HYSTERECTOMY  March 2006   [facility]   and Bilateral Salpingoophorectomy/  staging for Ovarian cancer/  an    Family History  Problem Relation Age of Onset  . Cancer Mother 63    stomach ca  . Hypertension Mother   . Cancer Father 20    prostate ca  . Diabetes Father   . Heart disease Father     CABG  . Breast cancer Maternal Aunt     dx in her 80s  . Lymphoma Paternal Aunt   . Brain cancer Paternal Grandfather   . Ovarian cancer Other   . Diabetes Sister   .  Hypertension Brother y-10  . Heart disease Brother     CABG  . Diabetes Brother    Social History:  reports that she has never smoked. She has never used smokeless tobacco. Her alcohol and drug histories are not on file.  Allergies:  Allergies  Allergen Reactions  . Penicillins Swelling    Facial swelling Has patient had a PCN reaction causing immediate rash, facial/tongue/throat swelling, SOB or lightheadedness with hypotension: Yes Has patient had a PCN reaction causing severe rash involving mucus membranes or skin necrosis: Yes Has patient had a PCN reaction that required hospitalization No Has patient had a PCN reaction occurring within the last 10 years: No If all of the above answers are "NO", then may proceed with Cephalosporin use.   [name] Kitchen Ultram [Tramadol] Hives  . Adhesive [Tape]     blisters  . Cefaclor Rash    Ceclor  . Erythromycin     Gastritis, abd cramps  . Trimethoprim Rash  . Pectin Rash    Pectin ring for stoma  . Sulfa Antibiotics Rash    Medications Prior to Admission  Medication Sig Dispense Refill  . anastrozole (ARIMIDEX) 1 MG tablet Take 1 tablet (1 mg total) by mouth daily. (Patient taking differently: Take 1 mg by mouth every morning. ) 90 tablet 3  . Biotin 5 MG TABS Take 5 mg by mouth every morning.     . Calcium-Magnesium-Vitamin D 400-166.7-133.3 MG-MG-UNIT TABS Take 2 tablets by mouth daily.    . Canagliflozin (INVOKANA) 300 MG TABS Take 300 mg by mouth every morning.     . Cholecalciferol (VITAMIN D3) 10000 UNITS capsule Take 10,000 Units by mouth once a week. Sunday evening's    . diphenhydrAMINE (BENADRYL) 25 MG tablet Take 25 mg by mouth every 4 (four) hours as needed for itching, allergies or sleep. Reported on 09/15/2015    . filgrastim (NEUPOGEN) 480 MCG/1.6ML injection Inject 1.6 ml under the skin every 5 days for life 16 mL 11  . levothyroxine (SYNTHROID, LEVOTHROID) 150 MCG tablet Take 150 mcg by mouth daily.    [name] Kitchen loratadine (CLARITIN) 10  MG tablet Take 10 mg by mouth every morning.     . metFORMIN (GLUCOPHAGE) 1000 MG tablet Take 1,000 mg by mouth 2 (two) times daily with a meal.     . omega-3 acid ethyl esters (LOVAZA) 1 G capsule Take 1 g by mouth 2 (two) times daily.    [name] Kitchen omeprazole (PRILOSEC) 20 MG capsule Take 20 mg by mouth every other day. Other other night    . Polyethyl Glycol-Propyl Glycol (SYSTANE OP) Place 1 drop into both eyes at bedtime.     . Prenatal Vit-Fe Fumarate-FA (PRENATAL VITAMIN PO) Take 1 capsule by mouth daily.    [name] Kitchen  rosuvastatin (CRESTOR) 10 MG tablet Take 10 mg by mouth every evening.     . sitaGLIPtin (JANUVIA) 100 MG tablet Take 100 mg by mouth every morning.     . valsartan (DIOVAN) 80 MG tablet Take 1 tablet (80 mg total) by mouth daily. (Patient taking differently: Take 80 mg by mouth every morning. )      No results found for this or any previous visit (from the past 48 hour(s)). No results found.  Review of Systems  Constitutional: Negative.  Negative for chills and fever.  HENT: Negative.   Eyes: Negative.   Respiratory: Negative.   Cardiovascular: Negative.   Gastrointestinal: Negative.   Genitourinary: Negative.   Musculoskeletal: Negative.   Skin: Negative.   Neurological: Negative.   Endo/Heme/Allergies: Negative.   Psychiatric/Behavioral: Negative.     Blood pressure 131/68, pulse 87, temperature 98.5 F (36.9 C), temperature source Oral, resp. rate 16, weight 94.3 kg (208 lb), SpO2 99 %. Physical Exam  Constitutional: She appears well-developed.  Eyes: Pupils are equal, round, and reactive to light.  Neck: Normal range of motion.  Cardiovascular: Normal rate.   Respiratory: Effort normal.  GI: Soft.  Significant truncal obesity, stable.   Genitourinary:  Genitourinary Comments: No CVAT  Musculoskeletal: Normal range of motion.  Neurological: She is alert.  Skin: Skin is warm.  Psychiatric: She has a normal mood and affect.     Assessment/Plan  Proceed as  planned with RIGHT ureteral stent exchange. Risks, benefits, alternatives, expected peri-op course discussed previously and reiterated today.   Alexis Frock, MD 10/27/2016, 6:08 AM

## 2016-10-27 NOTE — Interval H&P Note (Signed)
History and Physical Interval Note:  10/27/2016 7:06 AM  [name]  has presented today for surgery, with the diagnosis of RIGHT URETERAL STRICTURE  The various methods of treatment have been discussed with the patient and family. After consideration of risks, benefits and other options for treatment, the patient has consented to  Procedure(s): CYSTOSCOPY WITH STENT CHANGE (Right) as a surgical intervention .  The patient's history has been reviewed, patient examined, no change in status, stable for surgery.  I have reviewed the patient's chart and labs.  Questions were answered to the patient's satisfaction.     Faatima Tench

## 2016-10-28 ENCOUNTER — Encounter (HOSPITAL_BASED_OUTPATIENT_CLINIC_OR_DEPARTMENT_OTHER): Payer: Self-pay | Admitting: Urology

## 2016-11-03 ENCOUNTER — Telehealth: Payer: Self-pay

## 2016-11-03 NOTE — Telephone Encounter (Signed)
Told Ms Costantino that she is set up for an appointment with Dr. Johney Maine on 12-20-16 at 1130. Arrive at 1120. Ms Snowberger verbalized understanding.[name] Kitchen

## 2016-11-03 NOTE — Telephone Encounter (Signed)
-----   Message from Everitt Amber, MD sent at 10/11/2016  2:32 PM EDT ----- Rober Minion, Can we schedule Ms Weimer to see Dr Johney Maine Remo Lipps) at [facility] (she is an established patient) to discuss reversal of colostomy. Thanks Everitt Amber

## 2016-11-25 DIAGNOSIS — E559 Vitamin D deficiency, unspecified: Secondary | ICD-10-CM | POA: Diagnosis not present

## 2016-11-25 DIAGNOSIS — E119 Type 2 diabetes mellitus without complications: Secondary | ICD-10-CM | POA: Diagnosis not present

## 2016-11-25 DIAGNOSIS — E039 Hypothyroidism, unspecified: Secondary | ICD-10-CM | POA: Diagnosis not present

## 2016-11-25 DIAGNOSIS — E782 Mixed hyperlipidemia: Secondary | ICD-10-CM | POA: Diagnosis not present

## 2016-11-29 DIAGNOSIS — E782 Mixed hyperlipidemia: Secondary | ICD-10-CM | POA: Diagnosis not present

## 2016-11-29 DIAGNOSIS — E11319 Type 2 diabetes mellitus with unspecified diabetic retinopathy without macular edema: Secondary | ICD-10-CM | POA: Diagnosis not present

## 2016-11-29 DIAGNOSIS — E039 Hypothyroidism, unspecified: Secondary | ICD-10-CM | POA: Diagnosis not present

## 2016-11-29 DIAGNOSIS — I10 Essential (primary) hypertension: Secondary | ICD-10-CM | POA: Diagnosis not present

## 2016-12-08 ENCOUNTER — Other Ambulatory Visit: Payer: Self-pay | Admitting: Hematology and Oncology

## 2016-12-20 ENCOUNTER — Ambulatory Visit: Payer: Self-pay | Admitting: Surgery

## 2016-12-29 DIAGNOSIS — Z933 Colostomy status: Secondary | ICD-10-CM | POA: Diagnosis not present

## 2017-01-06 ENCOUNTER — Encounter: Payer: Self-pay | Admitting: Hematology and Oncology

## 2017-01-06 ENCOUNTER — Other Ambulatory Visit: Payer: Self-pay | Admitting: *Deleted

## 2017-01-06 ENCOUNTER — Encounter: Payer: Self-pay | Admitting: Gynecologic Oncology

## 2017-01-06 DIAGNOSIS — C549 Malignant neoplasm of corpus uteri, unspecified: Secondary | ICD-10-CM

## 2017-01-06 MED ORDER — FILGRASTIM 480 MCG/1.6ML IJ SOLN: INTRAMUSCULAR | 11 refills | Status: DC

## 2017-01-06 NOTE — Progress Notes (Signed)
Patient returned call. Advised patient that I received the voicemail and forwarded her information to [place]. Patient verbalized understanding and thanked me for my call.

## 2017-01-06 NOTE — Progress Notes (Signed)
Received note from [place] that Erline Levine RN w.ill speak with doctor and follow up with me.

## 2017-01-06 NOTE — Progress Notes (Signed)
Received voicemail from patient regarding authorization for Neupogen refills. Called patient back per her request to confirm received with my contact name and number.  Asked Darlena who would be the perosn to obtain the authorization. She asked me for the patient information. I gave it to her and forwarded the voicemail.

## 2017-01-07 ENCOUNTER — Encounter: Payer: Self-pay | Admitting: Hematology and Oncology

## 2017-01-07 ENCOUNTER — Telehealth: Payer: Self-pay

## 2017-01-07 NOTE — Addendum Note (Signed)
Addendum  created 01/07/17 1128 by Nemesio Castrillon, MD   Sign clinical note    

## 2017-01-07 NOTE — Progress Notes (Signed)
Received PA approval for Neupogen from [facility].  PA approved 12/18/16-02/06/2017.

## 2017-01-07 NOTE — Telephone Encounter (Signed)
Called pharmacy Neupogen is scheduled to be delivered 01-19-17, they already had authorization information when I called.

## 2017-01-20 DIAGNOSIS — C7960 Secondary malignant neoplasm of unspecified ovary: Secondary | ICD-10-CM | POA: Diagnosis not present

## 2017-01-20 DIAGNOSIS — N1330 Unspecified hydronephrosis: Secondary | ICD-10-CM | POA: Diagnosis not present

## 2017-01-20 DIAGNOSIS — C541 Malignant neoplasm of endometrium: Secondary | ICD-10-CM | POA: Diagnosis not present

## 2017-01-20 DIAGNOSIS — N135 Crossing vessel and stricture of ureter without hydronephrosis: Secondary | ICD-10-CM | POA: Diagnosis not present

## 2017-01-21 ENCOUNTER — Other Ambulatory Visit: Payer: Self-pay | Admitting: Urology

## 2017-01-21 ENCOUNTER — Telehealth: Payer: Self-pay

## 2017-01-21 NOTE — Telephone Encounter (Signed)
Scheduled Ms Kleinfelter for a CT C/A/P on 01-27-17 at 41 am at [facility]. She will have chemistries drawn 01-24-17 at 1245. Rescheduled Dr. Serita Grit appointment for 01-31-17 at 1:45 pm. Pt has contrast at home. NPO 4 hours prior to scan. Pt verbalized Understanding.

## 2017-01-24 ENCOUNTER — Ambulatory Visit: Payer: BLUE CROSS/BLUE SHIELD | Admitting: Gynecologic Oncology

## 2017-01-24 ENCOUNTER — Other Ambulatory Visit (HOSPITAL_BASED_OUTPATIENT_CLINIC_OR_DEPARTMENT_OTHER): Payer: BLUE CROSS/BLUE SHIELD

## 2017-01-24 ENCOUNTER — Other Ambulatory Visit: Payer: Self-pay | Admitting: Urology

## 2017-01-24 DIAGNOSIS — Z8543 Personal history of malignant neoplasm of ovary: Secondary | ICD-10-CM | POA: Diagnosis not present

## 2017-01-24 DIAGNOSIS — Z8542 Personal history of malignant neoplasm of other parts of uterus: Secondary | ICD-10-CM

## 2017-01-24 DIAGNOSIS — Z853 Personal history of malignant neoplasm of breast: Secondary | ICD-10-CM | POA: Diagnosis not present

## 2017-01-24 DIAGNOSIS — C549 Malignant neoplasm of corpus uteri, unspecified: Secondary | ICD-10-CM

## 2017-01-24 LAB — COMPREHENSIVE METABOLIC PANEL
ALBUMIN: 3.4 g/dL — AB (ref 3.5–5.0)
ALK PHOS: 90 U/L (ref 40–150)
ALT: 14 U/L (ref 0–55)
AST: 16 U/L (ref 5–34)
Anion Gap: 8 mEq/L (ref 3–11)
BUN: 22.2 mg/dL (ref 7.0–26.0)
CALCIUM: 10.2 mg/dL (ref 8.4–10.4)
CO2: 29 mEq/L (ref 22–29)
CREATININE: 0.8 mg/dL (ref 0.6–1.1)
Chloride: 104 mEq/L (ref 98–109)
EGFR: 79 mL/min/{1.73_m2} — ABNORMAL LOW (ref >90)
Glucose: 84 mg/dl (ref 70–140)
POTASSIUM: 4.6 meq/L (ref 3.5–5.1)
Sodium: 141 mEq/L (ref 136–145)
TOTAL PROTEIN: 6.9 g/dL (ref 6.4–8.3)
Total Bilirubin: 0.34 mg/dL (ref 0.20–1.20)

## 2017-01-26 ENCOUNTER — Ambulatory Visit (HOSPITAL_COMMUNITY): Payer: BLUE CROSS/BLUE SHIELD

## 2017-01-27 ENCOUNTER — Ambulatory Visit (HOSPITAL_COMMUNITY)
Admission: RE | Admit: 2017-01-27 | Discharge: 2017-01-27 | Disposition: A | Discharge status: Home / Self Care | Payer: BLUE CROSS/BLUE SHIELD | Source: Ambulatory Visit | Attending: Gynecologic Oncology | Admitting: Gynecologic Oncology

## 2017-01-27 ENCOUNTER — Telehealth: Payer: Self-pay | Admitting: Gynecologic Oncology

## 2017-01-27 DIAGNOSIS — N1330 Unspecified hydronephrosis: Secondary | ICD-10-CM | POA: Insufficient documentation

## 2017-01-27 DIAGNOSIS — E041 Nontoxic single thyroid nodule: Secondary | ICD-10-CM | POA: Diagnosis not present

## 2017-01-27 DIAGNOSIS — D3502 Benign neoplasm of left adrenal gland: Secondary | ICD-10-CM | POA: Diagnosis not present

## 2017-01-27 DIAGNOSIS — N132 Hydronephrosis with renal and ureteral calculous obstruction: Secondary | ICD-10-CM | POA: Diagnosis not present

## 2017-01-27 DIAGNOSIS — Y842 Radiological procedure and radiotherapy as the cause of abnormal reaction of the patient, or of later complication, without mention of misadventure at the time of the procedure: Secondary | ICD-10-CM | POA: Insufficient documentation

## 2017-01-27 DIAGNOSIS — C569 Malignant neoplasm of unspecified ovary: Secondary | ICD-10-CM | POA: Insufficient documentation

## 2017-01-27 DIAGNOSIS — C541 Malignant neoplasm of endometrium: Secondary | ICD-10-CM | POA: Diagnosis not present

## 2017-01-27 DIAGNOSIS — Z960 Presence of urogenital implants: Secondary | ICD-10-CM | POA: Insufficient documentation

## 2017-01-27 MED ORDER — IOPAMIDOL (ISOVUE-300) INJECTION 61%
100.0000 mL | Freq: Once | INTRAVENOUS | Status: AC | PRN
  Administered 2017-01-27: 100 mL via INTRAVENOUS

## 2017-01-27 MED ORDER — IOPAMIDOL (ISOVUE-300) INJECTION 61%
INTRAVENOUS | Status: AC
  Filled 2017-01-27: qty 100

## 2017-01-27 NOTE — Telephone Encounter (Signed)
Informed of CT scan results.  Asking about neupogen injections.  Per EPIC note from 10/11/16: change to every six days.  Advised to contact Dr. Calton Dach office for confirmation.  Advised to call for any questions or concerns.

## 2017-01-30 IMAGING — CT CT ABD-PELV W/ CM
2 of 5 series · 16 of 46 positions shown, 18 images · IV contrast (ISOVUE)
Comparison: 08/18/2015

CLINICAL DATA: Followup recurrent endometrial carcinoma. Right
lower quadrant pain for 1 year. Previous surgery, chemotherapy, and
radiation therapy.

EXAM:
CT ABDOMEN AND PELVIS WITH CONTRAST
TECHNIQUE: Multidetector CT imaging of the abdomen and pelvis was performed
using the standard protocol following bolus administration of
intravenous contrast.
CONTRAST:  100mL [id number] IOPAMIDOL ([id number]) INJECTION 61%

[Series 2: abd/pel with · axial · 0.74mm/px · z∈[-446,-86]mm · 13 of 86 slices shown, 15 images]
[im 7/86  soft-tissue]
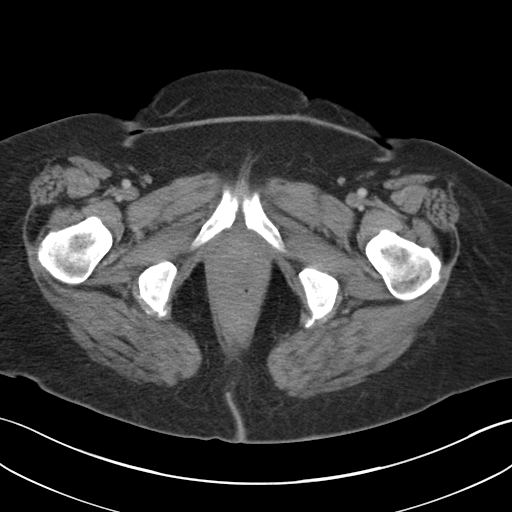
[im 7/86  bone]
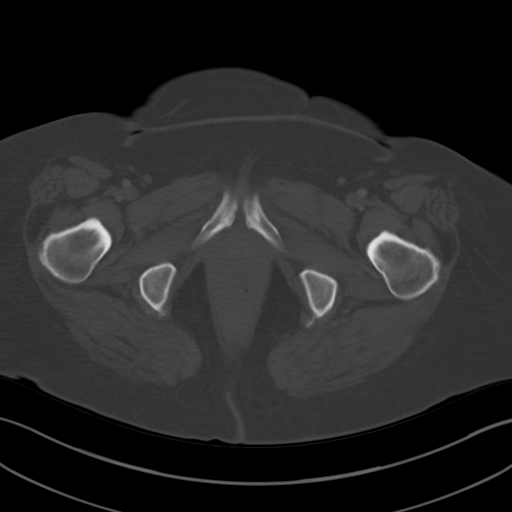
[im 13/86  soft-tissue]
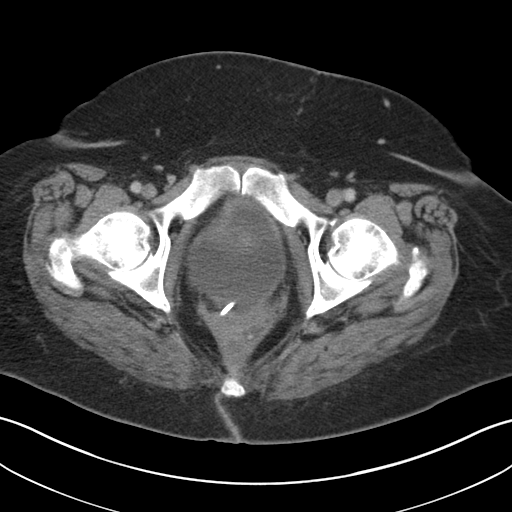
[im 19/86  soft-tissue]
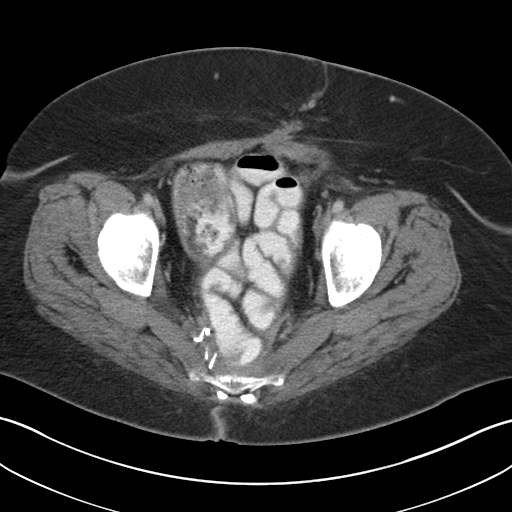
[im 25/86  soft-tissue]
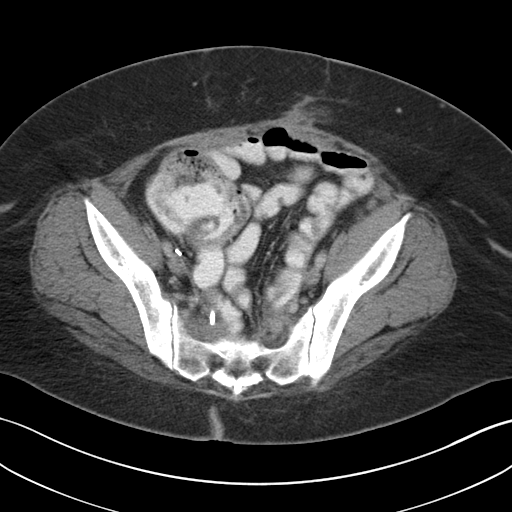
[im 31/86  soft-tissue]
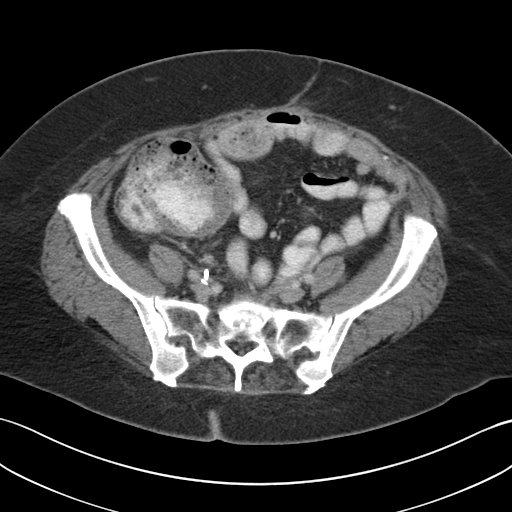
[im 37/86  soft-tissue]
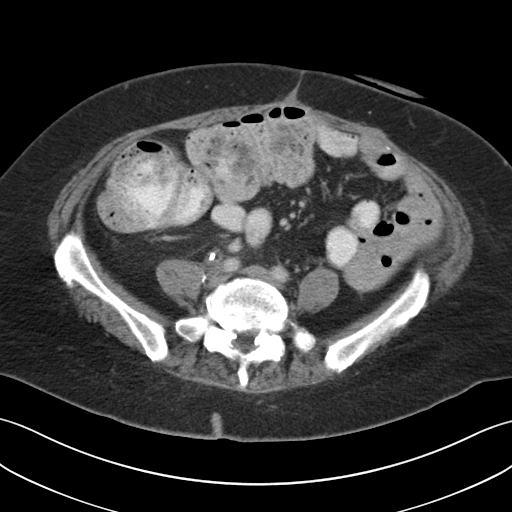
[im 43/86  soft-tissue]
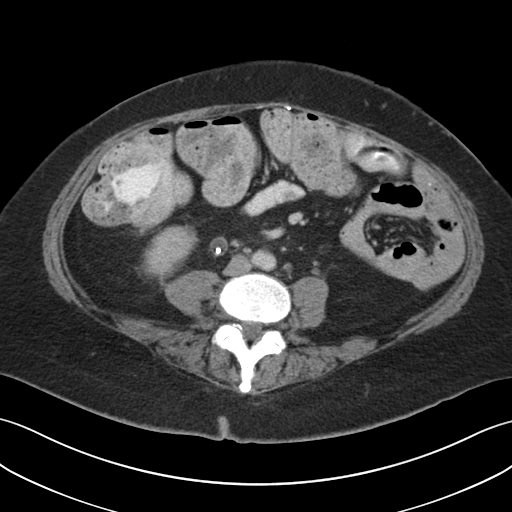
[im 49/86  soft-tissue]
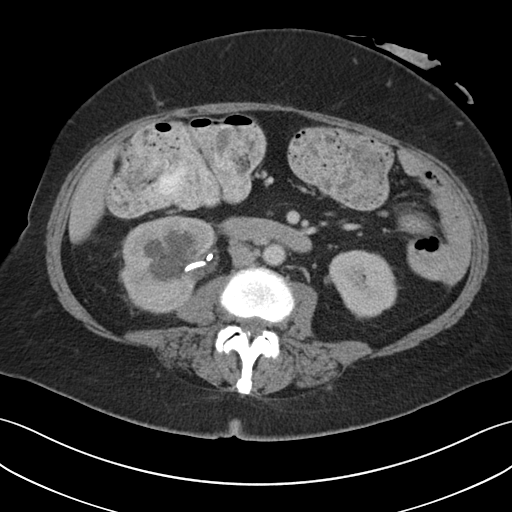
[im 55/86  soft-tissue]
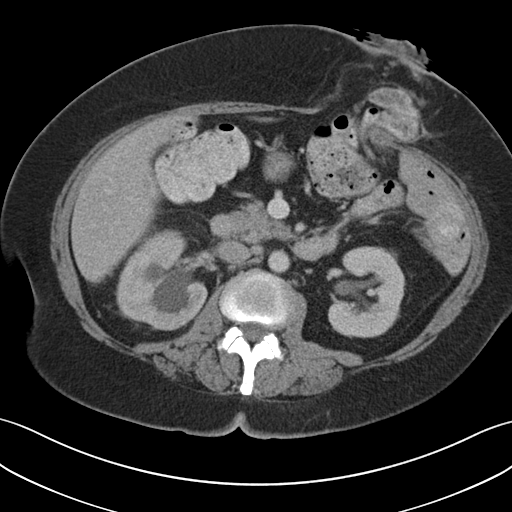
[im 55/86  bone]
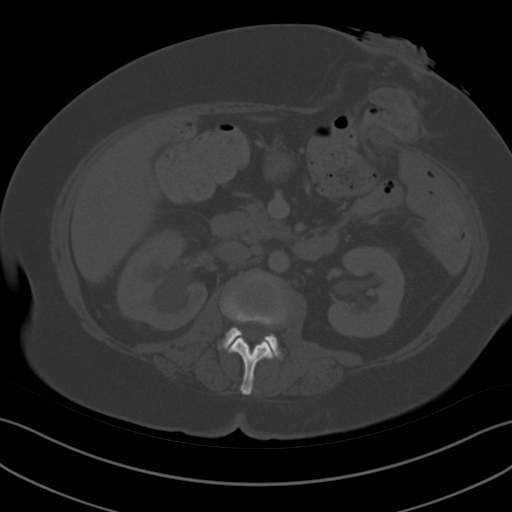
[im 61/86  soft-tissue]
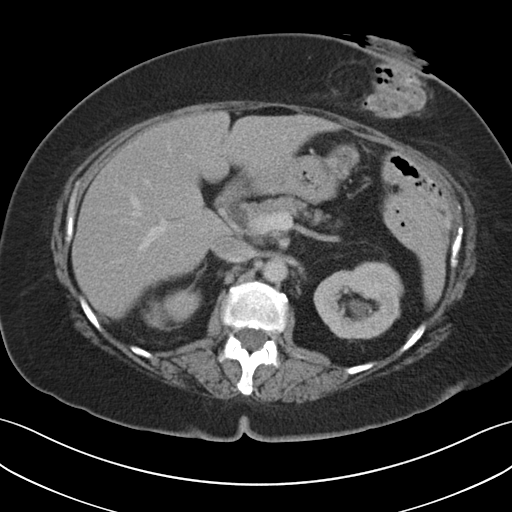
[im 67/86  soft-tissue]
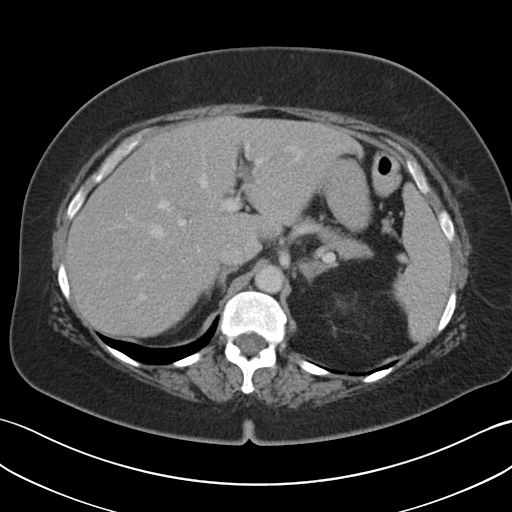
[im 73/86  soft-tissue]
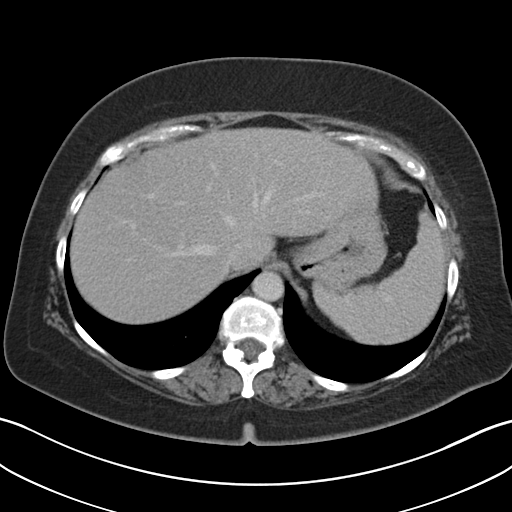
[im 79/86  soft-tissue]
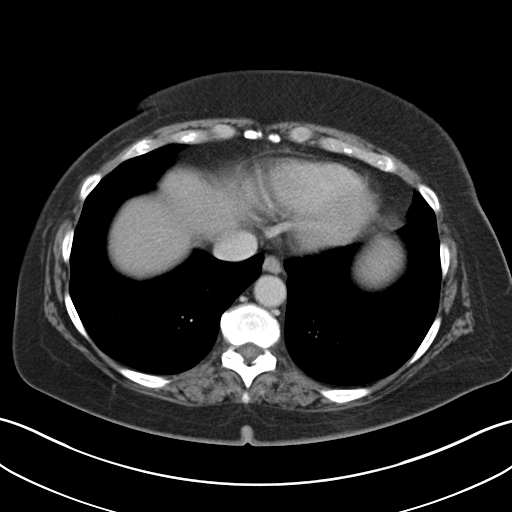

[Series 5: coronal a/|p · coronal · 0.85mm/px · 3 of 157 slices shown]
[im 53/157  soft-tissue]
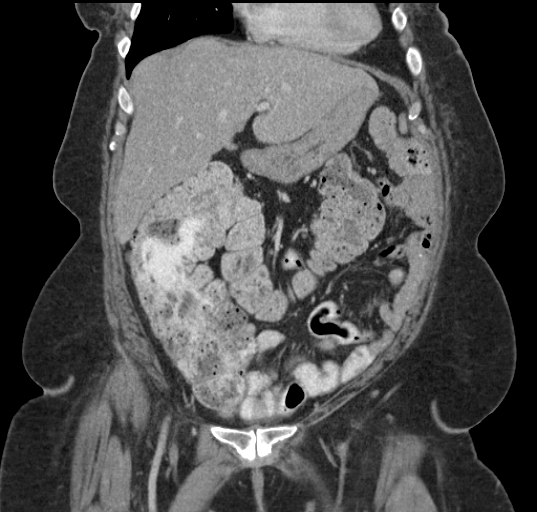
[im 70/157  soft-tissue]
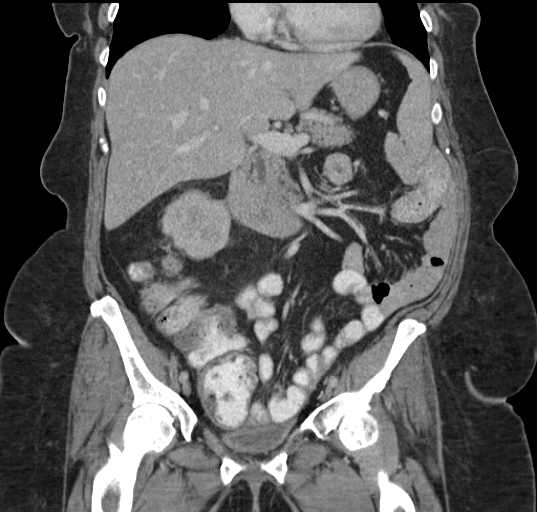
[im 87/157  soft-tissue]
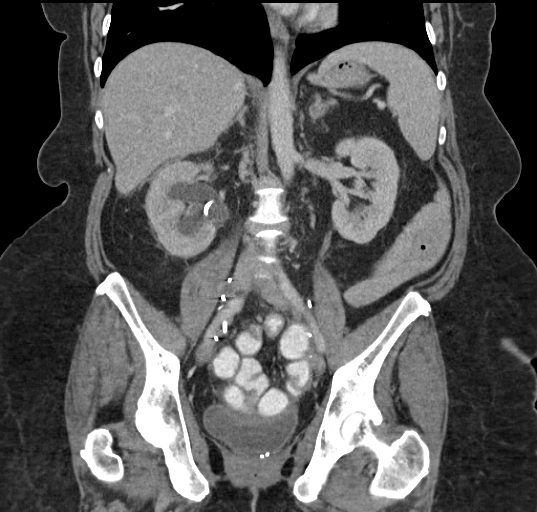

[16 of 46 positions shown; findings below may reference images not displayed]

FINDINGS: Lower Chest: No acute findings.

Hepatobiliary: No mass identified. Mild hepatic steatosis again
noted. Prior cholecystectomy noted. No evidence of biliary
dilatation.

Pancreas:  No mass or inflammatory changes.

Spleen:  Unremarkable.

Adrenals/Urinary Tract: Stable small benign left adrenal adenoma. No
renal masses identified. Moderate right hydronephrosis and
ureterectasis shows no significant change, however a right ureteral
stent is now seen in appropriate position. Unopacified urinary
bladder is unremarkable in appearance.

Stomach/Bowel: Prior left proctocolectomy again seen with transverse
colostomy in the left abdomen. No evidence of bowel obstruction wall
thickening, inflammatory changes, or abnormal fluid collections.

Vascular/Lymphatic: No pathologically enlarged lymph nodes. No other
abnormality identified.

Reproductive: Previous hysterectomy noted. The pre sacral soft
tissue density remains stable, and most consistent with post
treatment changes. No evidence recurrent mass.

Other:  None.

Musculoskeletal:  No suspicious bone lesions identified.
IMPRESSION: Stable post treatment changes in presacral region. No evidence of
recurrent or metastatic carcinoma within the abdomen or pelvis.

Interval placement of right ureteral stent in appropriate position.
Moderate right hydroureteronephrosis remains stable.

Stable benign left adrenal adenoma and hepatic steatosis.

## 2017-01-31 ENCOUNTER — Ambulatory Visit: Payer: BLUE CROSS/BLUE SHIELD | Attending: Gynecologic Oncology | Admitting: Gynecologic Oncology

## 2017-01-31 ENCOUNTER — Encounter: Payer: Self-pay | Admitting: Gynecologic Oncology

## 2017-01-31 VITALS — BP 120/62 | HR 68 | Temp 98.8°F | Resp 18 | Wt 209.0 lb

## 2017-01-31 DIAGNOSIS — D708 Other neutropenia: Secondary | ICD-10-CM | POA: Diagnosis not present

## 2017-01-31 DIAGNOSIS — Z888 Allergy status to other drugs, medicaments and biological substances status: Secondary | ICD-10-CM | POA: Diagnosis not present

## 2017-01-31 DIAGNOSIS — K219 Gastro-esophageal reflux disease without esophagitis: Secondary | ICD-10-CM | POA: Diagnosis not present

## 2017-01-31 DIAGNOSIS — Z808 Family history of malignant neoplasm of other organs or systems: Secondary | ICD-10-CM | POA: Diagnosis not present

## 2017-01-31 DIAGNOSIS — N135 Crossing vessel and stricture of ureter without hydronephrosis: Secondary | ICD-10-CM

## 2017-01-31 DIAGNOSIS — Z833 Family history of diabetes mellitus: Secondary | ICD-10-CM | POA: Insufficient documentation

## 2017-01-31 DIAGNOSIS — Z1509 Genetic susceptibility to other malignant neoplasm: Secondary | ICD-10-CM

## 2017-01-31 DIAGNOSIS — C541 Malignant neoplasm of endometrium: Secondary | ICD-10-CM | POA: Diagnosis not present

## 2017-01-31 DIAGNOSIS — C50912 Malignant neoplasm of unspecified site of left female breast: Secondary | ICD-10-CM | POA: Diagnosis not present

## 2017-01-31 DIAGNOSIS — Z800 Family history of malignant neoplasm of digestive organs: Secondary | ICD-10-CM | POA: Diagnosis not present

## 2017-01-31 DIAGNOSIS — Z8041 Family history of malignant neoplasm of ovary: Secondary | ICD-10-CM | POA: Diagnosis not present

## 2017-01-31 DIAGNOSIS — Z9013 Acquired absence of bilateral breasts and nipples: Secondary | ICD-10-CM | POA: Insufficient documentation

## 2017-01-31 DIAGNOSIS — E11319 Type 2 diabetes mellitus with unspecified diabetic retinopathy without macular edema: Secondary | ICD-10-CM | POA: Diagnosis not present

## 2017-01-31 DIAGNOSIS — Z170 Estrogen receptor positive status [ER+]: Secondary | ICD-10-CM

## 2017-01-31 DIAGNOSIS — Z9889 Other specified postprocedural states: Secondary | ICD-10-CM | POA: Insufficient documentation

## 2017-01-31 DIAGNOSIS — Z803 Family history of malignant neoplasm of breast: Secondary | ICD-10-CM | POA: Insufficient documentation

## 2017-01-31 DIAGNOSIS — Z8542 Personal history of malignant neoplasm of other parts of uterus: Secondary | ICD-10-CM

## 2017-01-31 DIAGNOSIS — Z8042 Family history of malignant neoplasm of prostate: Secondary | ICD-10-CM | POA: Insufficient documentation

## 2017-01-31 DIAGNOSIS — D630 Anemia in neoplastic disease: Secondary | ICD-10-CM | POA: Insufficient documentation

## 2017-01-31 DIAGNOSIS — Z880 Allergy status to penicillin: Secondary | ICD-10-CM | POA: Insufficient documentation

## 2017-01-31 DIAGNOSIS — Z853 Personal history of malignant neoplasm of breast: Secondary | ICD-10-CM | POA: Insufficient documentation

## 2017-01-31 DIAGNOSIS — N134 Hydroureter: Secondary | ICD-10-CM

## 2017-01-31 DIAGNOSIS — Z923 Personal history of irradiation: Secondary | ICD-10-CM | POA: Diagnosis not present

## 2017-01-31 DIAGNOSIS — I10 Essential (primary) hypertension: Secondary | ICD-10-CM | POA: Diagnosis not present

## 2017-01-31 DIAGNOSIS — Z8249 Family history of ischemic heart disease and other diseases of the circulatory system: Secondary | ICD-10-CM | POA: Diagnosis not present

## 2017-01-31 DIAGNOSIS — Z90710 Acquired absence of both cervix and uterus: Secondary | ICD-10-CM | POA: Insufficient documentation

## 2017-01-31 DIAGNOSIS — Z79899 Other long term (current) drug therapy: Secondary | ICD-10-CM | POA: Insufficient documentation

## 2017-01-31 NOTE — Patient Instructions (Signed)
Plan to follow up in six months or sooner if needed.  Please call for any questions or concerns. 

## 2017-01-31 NOTE — Progress Notes (Signed)
Recurrent Endometrial cancer FOLLOWUP VISIT  Assessment:    61 y.o. year old with history of recurrent endometrioid endometrial/ovarian cancer.   S/p exploratory laparotomy, posterior supralevator pelvic exenteration, end colostomy, bladder repair, ureteral stenting with complete resection and negative margins on 11/09/14. S/p adjuvant radiation completed 03/10/15  Postoperative and post-radiation right hydroureter and distal ureteral obstruction with ureteral stent in situ.  No recurrence/persistence of tumor on imaging and physical exam (complete clinical response).   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 No specific follow-up or surveillance recommended by genetics counselor.  Plan: 1) Recurrent endometrial/ovarian cancer: s/p complete resection with negative margins and s/p adjuvant radiation. No chemotherapy due to chronic idiopathic neutropenia.   CT imaging post treatment shows stable thickening of upper vagina and right peri-urethral tissues which is most likely radiation changes and postop changes. Will transition to 6 monthly surveillance exams.  2) Right hydroureter and distal ureteral obstruction - secondary to post operative scaring and post-radiation changes. Planned re-implantation and colostomy reversal August 31st/  3) neutropenia - Dr Alvy Bimler is managing this and we appreciate. Patient is having bone and joint aches and wonders if this is from more frequent Granix injections.  4) breast cancer - stage IIA. ER/PR positive. Treated with surgery and arimidex.  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 Will have patient follow-up with Dr Cristina Gong after breast surgery for close colon cancer and upper GI cancer surveillance  6) Follow-up: I will see [name] for followup in January, 2019.  HPI:  [name] is a 61 y.o. year old referred by Dr  Alvy Bimler for recurrent endometrioid endometrial/ovarian cancer (central pelvic recurrence) in the setting of chronic idiopathic neutropenia.  She has a history of endometrial and ovarian endometrioid carcinoma treated in 2006 by Dr. [name] at [facility] in [place]. Her surgery (TAH, BSO) was followed by adjuvant chemotherapy with carboplatin plus paclitaxel due to the ovarian involvement and the presence of a cul de sac lesion also positive for disease. She denies receiving adjuvant radiation therapy. She is unclear if she had metastatic endometrial cancer to the ovary or duel primaries. She had a complete response to therapy however developed leukopenia in July 2010. 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 She sees Dr. Alvy Bimler for this and is treated with G-CSF injections.  She began experiencing rectal pain approximately in January 2016. She also reports narrowing of caliber of the stool. She denies hematochezia. She does report approximately 3 months of vaginal spotting. She's had no specific follow-up for her gynecologic cancers in the past 4 years.  As part of workup of her rectal pain she underwent a CT scan of the abdomen and pelvis on 09/12/2014. This demonstrated a new right perirectal mass abutting the vaginal cuff measuring 3.8 x 4.9 cm. There is a limited fat plane between the mass and the rectum posteriorly. Rectal invasion could not be excluded. There were no other masses identified in the abdomen and pelvis or lymphadenopathy. There is no other evidence of metastatic disease or recurrent disease. There was no hydronephrosis. A CA-125 drawn on 09/12/2014 was normal at 10.  PET was negative for extrapelvic disease. MRI defined the lesion as a 3.5x5cm lesion to the right of the rectum at the vaginal cuff.  Colonoscopy was performed on 10/02/14 with  transrectal  [place] and biopsy and this revealed endometrioid adenocarcinoma. Of note, the lesion was not seen within the lumen of the rectum.   She then underwent a posterior supralevator exenteration with colostomy and bladder repair and stent placement on 7/94/80 without complications.  Her postoperative course was uncomplicate with the exception of development of postop anemia.  Her final pathology revealed endometrioid adenocarcinoma invading the vagina and rectum with negative margins on the specimen. The margin had been close (clinically) to the right pelvic sidewall which was marked with surgical clips.  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 It was treated with IV antibiotics and then on POD 15 she was taken to the OR for laparoscopic revision of the stoma with Dr Michael [name]. Postoperatively she had wound vac and packing for her stomal wound.  A retrograde cystogram on week 4 postop confirmed an intact bladder and the foley was removed.  She initially had some voiding issues with decreased sensation to void. We tested a post void residual in May, 2016 and this revealed adequate voiding.  On 12/30/14 she was admitted to [facility] with sepsis associated with Enterobacter Cloacae UTI. This was treated with IV antibiotics and then a prolonged course of oral cipro. Imaging performed at the time of admission (a CT of the abdo/pelvis) revealed: Bilateral double-J internal ureteral stents in adequate position with persistent mild bilateral hydronephrosis   She complete radiation therapy from 01/29/15 to 03/10/15 with 50Gy of external beam radiation and IMRT. She tolerated therapy well with minor skin irritation.  She had her ureteral stents removed in June, 2016, however, then developed right ureteral obstruction, hydroureter and pain. She went to the OR with Dr Tresa Moore on 03/20/15 for ureteral dilation and right stent placement (the  left was draining well). No tumor was seen on cysto.   Right ureteral stent now out. No vaginal bleeding. Stoma working well. Gaining weight. Mood improved.   CT abdo/pelvis on 05/14/15 showed: no new lesions, stable thickening in right pelvis/distal right ureter consistent with radiation effect.  She has had bronchitis symptoms for >73month s/p levaquin trial.  The patient's CT in January 2017 showed progression of her right ureteral obstruction after stent removal. Dr MTresa Mooretook her to the OR on 09/05/15 for a cystoscopy, retrograde pyelogram and right ureteral stent placement.   The CT on 08/18/15 showed decreased attenuation of the right renal parenchyma, right hydronephrosis that was severe no excretion of contrast on that for graphic phase imaging. The perivascular soft tissue thickening in the pelvis and presacral regions grossly stable consistent with radiation changes.  The patient was diagnosed with ER/PR positive left breast cancer in April 2017. Surgery is scheduled for May. She is having a bilateral mastectomy with expander reconstruction.  Repeat CT imaging on 02/08/16 revealed : Stable post treatment changes in presacral region. No evidence of recurrent or metastatic carcinoma within the abdomen or pelvis. Interval placement of right ureteral stent in appropriate position. Moderate right hydroureteronephrosis remains stable. Stable benign left adrenal adenoma and hepatic steatosis.  On 06/30/16 she had replacement of her right ureteral stent with Dr MTresa Moore  Interval Hx:  On 10/07/16 CT surveillance was performed which showed: Stable exam.  No new or progressive findings. Stable appearance abnormal presacral soft tissue compatible with post treatment change. Internal right ureteral stent with persistent right hydroureteronephrosis. Differentially decreased perfusion of the right kidney suggests a component of underlying obstructive uropathy. Stable 13  mm left adrenal nodule, previously  characterized as adenoma. A parastomal hernia was noted.  CT 01/27/17 showed stable findings with no evidence of recurrence. Persistent right hydroureter was again noted.  She has surgery scheduled 04/01/17 with Dr Tresa Moore and Dr Johney Maine for reversal of colostomy, resection of distal right ureter.     Review of systems: Constitutional:  She has no weight gain or weight loss. She has a low grade fever no chills. Eyes: No blurred vision Ears, Nose, Mouth, Throat: No dizziness, headaches or changes in hearing. No mouth sores. Cardiovascular: No chest pain, palpitations or edema. Respiratory:  No shortness of breath, wheezing  Gastrointestinal: She has normal bowel movements trhough stoma without diarrhea or constipation. She denies any nausea or vomiting. She denies blood in her stool or heart burn. Genitourinary:  See HPI Musculoskeletal: Denies muscle weakness or joint pains.  Skin:  She has no skin changes, rashes or itching Neurological:  Denies dizziness or headaches. No neuropathy, no numbness or tingling. Psychiatric:  She denies depression or anxiety. Hematologic/Lymphatic:   No easy bruising or bleeding  Allergies  Allergen Reactions  . Penicillins Swelling    Facial swelling Has patient had a PCN reaction causing immediate rash, facial/tongue/throat swelling, SOB or lightheadedness with hypotension: Yes Has patient had a PCN reaction causing severe rash involving mucus membranes or skin necrosis: Yes Has patient had a PCN reaction that required hospitalization No Has patient had a PCN reaction occurring within the last 10 years: No If all of the above answers are "NO", then may proceed with Cephalosporin use.   [name] Kitchen Ultram [Tramadol] Hives  . Adhesive [Tape]     blisters  . Cefaclor Rash    Ceclor  . Erythromycin     Gastritis, abd cramps  . Trimethoprim Rash  . Pectin Rash    Pectin ring for stoma  . Sulfa Antibiotics Rash    Current Outpatient Prescriptions on File Prior  to Visit  Medication Sig Dispense Refill  . anastrozole (ARIMIDEX) 1 MG tablet TAKE 1 TABLET DAILY 90 tablet 3  . Biotin 5 MG TABS Take 5 mg by mouth every morning.     . Calcium-Magnesium-Vitamin D 400-166.7-133.3 MG-MG-UNIT TABS Take 1 tablet by mouth daily.     . Cholecalciferol (VITAMIN D3) 10000 UNITS capsule Take 10,000 Units by mouth once a week. Sunday evening's    . diphenhydrAMINE (BENADRYL) 25 MG tablet Take 25 mg by mouth every 4 (four) hours as needed for itching, allergies or sleep. Reported on 09/15/2015    . filgrastim (NEUPOGEN) 480 MCG/1.6ML injection Inject 1.6 ml under the skin every 5 days for life 16 mL 11  . levothyroxine (SYNTHROID, LEVOTHROID) 150 MCG tablet Take 150 mcg by mouth daily.    [name] Kitchen loratadine (CLARITIN) 10 MG tablet Take 10 mg by mouth every morning.     . metFORMIN (GLUCOPHAGE) 1000 MG tablet Take 1,000 mg by mouth 2 (two) times daily with a meal.     . omega-3 acid ethyl esters (LOVAZA) 1 G capsule Take 1 g by mouth 2 (two) times daily.    Vladimir Faster Glycol-Propyl Glycol (SYSTANE OP) Place 1 drop into both eyes at bedtime.     . Prenatal Vit-Fe Fumarate-FA (PRENATAL VITAMIN PO) Take 1 capsule by mouth daily.    . rosuvastatin (CRESTOR) 10 MG tablet Take 10 mg by mouth every evening.     . sitaGLIPtin (JANUVIA) 100 MG tablet Take 100 mg by mouth every morning.     [name] Kitchen  valsartan (DIOVAN) 80 MG tablet Take 1 tablet (80 mg total) by mouth daily. (Patient taking differently: Take 80 mg by mouth every morning. )     No current facility-administered medications on file prior to visit.     Past Medical History:  Diagnosis Date  . Anemia in neoplastic disease   . Benign essential HTN   . 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 Cancer of corpus uteri, except isthmus [facility]) dx  10-15-2004 oncologist-- dr Denman [name] and dr Alvy Bimler     dx endometrial and ovarian cancer s/p  chemotheapy and surgery:  recurrent 11-19-2014 w/ radiation 01-29-2015 to 03-10-2015  . Chronic idiopathic neutropenia (HCC)    presumed related to chemotherapy March 2006--- followed by dr Alvy Bimler   . Complication of anesthesia    PONV  . Diabetic retinopathy, background ([place])   . DM type 2 (diabetes mellitus, type 2) ([place])    monitored by dr Legrand Como altheimer  . Dysuria   . GERD (gastroesophageal reflux disease)    takes omeprazole  . Hiatal hernia   . History of bronchitis   . History of gastric polyp    2014  duodenum  . History of radiation therapy    01-29-2015 to 03-10-2015  pelvis 50.4Gy  . Hypothyroidism    monitored by dr Legrand Como altheimer  . Mixed dyslipidemia   . Multiple thyroid nodules    Managed by Dr. Harlow Asa  . PONV (postoperative nausea and vomiting)    "scopolamine patch works for me"  . Radiation-induced dermatitis    contact dermatitis , radiation completed, rash only on ankles now.  . Right flank pain   . S/P colostomy ([place])   . Seasonal allergies   . Shoulder stiffness    bilateral   . Ureteral stricture, right UROLOGIT-  DR [facility]   CHRONIC--  TREATMENT URETERAL STENT  . Vitamin D deficiency   . Wears glasses     Past Surgical History:  Procedure Laterality Date  . APPENDECTOMY    . BREAST RECONSTRUCTION WITH PLACEMENT OF TISSUE EXPANDER AND FLEX HD (ACELLULAR HYDRATED DERMIS) Bilateral 12/11/2015   Procedure: BILATERAL BREAST RECONSTRUCTION WITH PLACEMENT OF TISSUE EXPANDERS;  Surgeon: Irene Limbo, MD;  Location: [place];  Service: Plastics;  Laterality: Bilateral;  . COLONOSCOPY WITH PROPOFOL N/A 08/21/2013   Procedure: COLONOSCOPY WITH PROPOFOL;  Surgeon: Cleotis Nipper, MD;  Location: [facility];  Service: Endoscopy;  Laterality: N/A;  . COLOSTOMY TAKEDOWN N/A 12/04/2014   Procedure: LAPROSCOPIC LYSIS OF ADHESIONS, SPLENIC MOBILIZATION, RELOCATION OF  COLOSTOMY, DEBRIDEMENT INITIAL COLOSTOMY SITE;  Surgeon: Michael [name], MD;  Location: [facility];  Service: General;  Laterality: N/A;  . CYSTOSCOPY W/ RETROGRADES Right 11/21/2015   Procedure: CYSTOSCOPY WITH RETROGRADE PYELOGRAM;  Surgeon: Alexis Frock, MD;  Location: [facility];  Service: Urology;  Laterality: Right;  . CYSTOSCOPY W/ URETERAL STENT PLACEMENT Right 11/21/2015   Procedure: CYSTOSCOPY WITH STENT REPLACEMENT;  Surgeon: Alexis Frock, MD;  [facility];  Service: Urology;  Laterality: Right;  . CYSTOSCOPY W/ URETERAL STENT PLACEMENT Right 03/10/2016   Procedure: CYSTOSCOPY WITH STENT REPLACEMENT;  Surgeon: Alexis Frock, MD;  Location: [facility];  Service: Urology;  Laterality: Right;  . CYSTOSCOPY W/ URETERAL STENT PLACEMENT Right 06/30/2016   Procedure: CYSTOSCOPY WITH RETROGRADE PYELOGRAM/URETERAL STENT EXCHANGE;  Surgeon: Alexis Frock, MD;  Location: [place];  Service: Urology;  Laterality: Right;  . CYSTOSCOPY WITH RETROGRADE PYELOGRAM, URETEROSCOPY AND STENT PLACEMENT Right 03/20/2015   Procedure: CYSTOSCOPY WITH RETROGRADE PYELOGRAM, URETEROSCOPY WITH BALLOON DILATION AND STENT PLACEMENT ON RIGHT;  Surgeon: Alexis Frock, MD;  Location: [facility];  Service: Urology;  Laterality: Right;  . CYSTOSCOPY WITH RETROGRADE PYELOGRAM, URETEROSCOPY AND STENT PLACEMENT Right 05/02/2015   Procedure: CYSTOSCOPY WITH RIGHT RETROGRADE PYELOGRAM,  DIAGNOSTIC URETEROSCOPY AND STENT PULL ;  Surgeon: Alexis Frock, MD;  Location: [facility];  Service: Urology;  Laterality: Right;  . CYSTOSCOPY WITH RETROGRADE PYELOGRAM, URETEROSCOPY AND STENT PLACEMENT Right 09/05/2015   Procedure: CYSTOSCOPY WITH RETROGRADE PYELOGRAM,  AND STENT PLACEMENT;  Surgeon: Alexis Frock, MD;  [facility];  Service: Urology;  Laterality: Right;  . CYSTOSCOPY WITH STENT PLACEMENT Right 10/27/2016   Procedure: CYSTOSCOPY WITH STENT CHANGE and right  retrograde pyelogram;  Surgeon: Alexis Frock, MD;  Location[facility];  Service: Urology;  Laterality: Right;  . EUS N/A 10/02/2014   Procedure: LOWER ENDOSCOPIC ULTRASOUND (EUS);  Surgeon: Arta Silence, MD;  Location: [facility];  Service: Endoscopy;  Laterality: N/A;  . EXCISION SOFT TISSUE MASS RIGHT FOREMAN  12-08-2006  . EYE SURGERY  as child   pytosis of eyelids repair  . INCISION AND DRAINAGE OF WOUND Bilateral 12/26/2015   Procedure: DEBRIDEMENT OF BILATERAL MASTECTOMY FLAPS;  Surgeon: Irene Limbo, MD;  Location: [place];  Service: Plastics;  Laterality: Bilateral;  . LAPAROSCOPIC CHOLECYSTECTOMY  1990  . LIPOSUCTION WITH LIPOFILLING Bilateral 04/16/2016   Procedure: LIPOSUCTION WITH LIPOFILLING TO BILATERAL CHEST;  Surgeon: Irene Limbo, MD;  Location: [place];  Service: Plastics;  Laterality: Bilateral;  . MASTECTOMY Right 12/11/2015   PROPHYLACTIC   . MASTECTOMY COMPLETE / SIMPLE W/ SENTINEL NODE BIOPSY Left 12/11/2015   AXILLARY SENTINEL LYMPH NODE BIOPSY   . MASTECTOMY W/ SENTINEL NODE BIOPSY Bilateral 12/11/2015   Procedure: RIGHT PROPHYLACTIC MASTECTOMY, LEFT TOTAL MASTECTOMY WITH LEFT AXILLARY SENTINEL LYMPH NODE BIOPSY;  Surgeon: Stark Klein, MD;  Location: [place];  Service: General;  Laterality: Bilateral;  . OSTOMY N/A 11/19/2014   Procedure: OSTOMY;  Surgeon: Michael [name], MD;  [facility];  Service: General;  Laterality: N/A;  . REMOVAL OF BILATERAL TISSUE EXPANDERS WITH PLACEMENT OF BILATERAL BREAST IMPLANTS Bilateral 04/16/2016   Procedure: REMOVAL OF BILATERAL TISSUE EXPANDERS WITH PLACEMENT OF BILATERAL BREAST IMPLANTS;  Surgeon: Irene Limbo, MD;  Location: [place];  Service: Plastics;  Laterality: Bilateral;  . ROBOTIC ASSISTED LAP VAGINAL HYSTERECTOMY N/A 11/19/2014   Procedure: ROBOTIC LYSIS OF ADHESIONS, CONVERTED TO LAPAROTOMY RADICAL UPPER VAGINECTOMY,LOW ANTERIOR BOWEL  RESECTION, COLOSTOMY, BILATERAL URETERAL STENT PLACEMENT AND CYSTONOMY CLOSURE;  Surgeon: Everitt Amber, MD;  Location[facility];  Service: Gynecology;  Laterality: N/A;  . TISSUE EXPANDER FILLING Bilateral 12/26/2015   Procedure: EXPANSION OF BILATERAL CHEST TISSUE EXPANDERS (60 mL- Right; 75 mL- Left);  Surgeon: Irene Limbo, MD;  Location: [place];  Service: Plastics;  Laterality: Bilateral;  . TONSILLECTOMY    . TOTAL ABDOMINAL HYSTERECTOMY  March 2006   [facility]   and Bilateral Salpingoophorectomy/  staging for Ovarian cancer/  an    Family History  Problem Relation Age of Onset  . Cancer Mother 56       stomach ca  . Hypertension Mother   . Cancer Father 67       prostate ca  . Diabetes Father   .  Heart disease Father        CABG  . Breast cancer Maternal Aunt        dx in her 53s  . Lymphoma Paternal Aunt   . Brain cancer Paternal Grandfather   . Ovarian cancer Other   . Diabetes Sister   . Hypertension Brother y-10  . Heart disease Brother        CABG  . Diabetes Brother     Social History   Social History  . Marital status: Married    Spouse name: N/A  . Number of children: 1  . Years of education: N/A   Occupational History  . retired Therapist, sports from [place] History Main Topics  . Smoking status: Never Smoker  . Smokeless tobacco: Never Used  . Alcohol use Not on file     Comment: rare social  . Drug use: Unknown  . Sexual activity: Not Currently   Other Topics Concern  . Not on file   Social History Narrative   Exercise-- has not gotten back into it since cancer came back   CBC    Component Value Date/Time   WBC 6.0 10/07/2016 0921   WBC 3.2 (L) 04/14/2016 1200   RBC 4.66 10/07/2016 0921   RBC 4.53 04/14/2016 1200   HGB 12.9 10/27/2016 0641   HGB 12.6 10/07/2016 0921   HCT 38.0 10/27/2016 0641   HCT 39.8 10/07/2016 0921   PLT 274 10/07/2016 0921   MCV 85.4 10/07/2016 0921   MCH 27.0 10/07/2016 0921   MCH 25.8 (L) 04/14/2016  1200   MCHC 31.7 10/07/2016 0921   MCHC 31.0 04/14/2016 1200   RDW 16.4 (H) 10/07/2016 0921   LYMPHSABS 0.7 (L) 10/07/2016 0921   MONOABS 0.7 10/07/2016 0921   EOSABS 0.2 10/07/2016 0921   BASOSABS 0.0 10/07/2016 0921     Physical Exam: Blood pressure 120/62, pulse 68, temperature 98.8 F (37.1 C), temperature source Oral, resp. rate 18, weight 209 lb (94.8 kg), SpO2 100 %. General: Well dressed, well nourished in no apparent distress.   HEENT:  Normocephalic and atraumatic, no lesions.  Extraocular muscles intact. Sclerae anicteric. Pupils equal, round, reactive. No mouth sores or ulcers. Thyroid is normal size, not nodular, midline. Skin:  No lesions or rashes. Breasts:  deferred Lungs:  Clear to auscultation bilaterally.  No wheezes. Cardiovascular:  Regular rate and rhythm.  No murmurs or rubs. Abdomen:  Soft, nontender, nondistended.  No palpable masses.  No hepatosplenomegaly.  No ascites. Normal bowel sounds.  No hernias.  Incision is healed. Laparoscopic incision healed. Stomal site healed. Stomal appliance in situ. Genitourinary: vaginal cuff intact without blood or fluid in the vault. Improved length and elasticity. Normal mucosa.No palpable masses in pelvis. Rectal exam: stump in tact, no masses Extremities: No cyanosis, clubbing or edema.  No calf tenderness or erythema. No palpable cords. Psychiatric: Mood and affect are appropriate. Neurological: Awake, alert and oriented x 3. Sensation is intact, no neuropathy.  Musculoskeletal: No pain, normal strength and range of motion.  Donaciano Eva, MD  CC: Dr Tresa Moore, Dr Alvy Bimler, Dr Theressa Millard, Dr Madelon Lips

## 2017-02-14 ENCOUNTER — Ambulatory Visit: Payer: BLUE CROSS/BLUE SHIELD | Admitting: Gynecologic Oncology

## 2017-02-16 ENCOUNTER — Encounter: Payer: Self-pay | Admitting: Hematology and Oncology

## 2017-02-16 NOTE — Progress Notes (Signed)
Received PA request for Neupogen via from [place].  Gave to RN to complete and have physician sign and return back to me.  Received completed and signed physician form.  Faxed to [facility]. Fax received ok per confirmation sheet.

## 2017-02-18 ENCOUNTER — Encounter: Payer: Self-pay | Admitting: Hematology and Oncology

## 2017-02-18 NOTE — Progress Notes (Signed)
Received PA determination from [facility].  PA approved 01/19/17-03/20/17.

## 2017-02-23 DIAGNOSIS — D709 Neutropenia, unspecified: Secondary | ICD-10-CM | POA: Diagnosis not present

## 2017-02-23 DIAGNOSIS — Z9013 Acquired absence of bilateral breasts and nipples: Secondary | ICD-10-CM | POA: Diagnosis not present

## 2017-02-23 DIAGNOSIS — Z853 Personal history of malignant neoplasm of breast: Secondary | ICD-10-CM | POA: Diagnosis not present

## 2017-02-28 DIAGNOSIS — E039 Hypothyroidism, unspecified: Secondary | ICD-10-CM | POA: Diagnosis not present

## 2017-02-28 DIAGNOSIS — E119 Type 2 diabetes mellitus without complications: Secondary | ICD-10-CM | POA: Diagnosis not present

## 2017-02-28 DIAGNOSIS — E782 Mixed hyperlipidemia: Secondary | ICD-10-CM | POA: Diagnosis not present

## 2017-03-02 DIAGNOSIS — Z933 Colostomy status: Secondary | ICD-10-CM | POA: Diagnosis not present

## 2017-03-03 DIAGNOSIS — E119 Type 2 diabetes mellitus without complications: Secondary | ICD-10-CM | POA: Diagnosis not present

## 2017-03-03 DIAGNOSIS — E039 Hypothyroidism, unspecified: Secondary | ICD-10-CM | POA: Diagnosis not present

## 2017-03-03 DIAGNOSIS — I10 Essential (primary) hypertension: Secondary | ICD-10-CM | POA: Diagnosis not present

## 2017-03-03 DIAGNOSIS — E782 Mixed hyperlipidemia: Secondary | ICD-10-CM | POA: Diagnosis not present

## 2017-03-18 ENCOUNTER — Other Ambulatory Visit (HOSPITAL_BASED_OUTPATIENT_CLINIC_OR_DEPARTMENT_OTHER): Payer: BLUE CROSS/BLUE SHIELD

## 2017-03-18 ENCOUNTER — Telehealth: Payer: Self-pay | Admitting: Hematology and Oncology

## 2017-03-18 ENCOUNTER — Encounter: Payer: Self-pay | Admitting: Hematology and Oncology

## 2017-03-18 ENCOUNTER — Ambulatory Visit (HOSPITAL_BASED_OUTPATIENT_CLINIC_OR_DEPARTMENT_OTHER): Payer: BLUE CROSS/BLUE SHIELD | Admitting: Hematology and Oncology

## 2017-03-18 DIAGNOSIS — Z8543 Personal history of malignant neoplasm of ovary: Secondary | ICD-10-CM

## 2017-03-18 DIAGNOSIS — R232 Flushing: Secondary | ICD-10-CM

## 2017-03-18 DIAGNOSIS — T451X5A Adverse effect of antineoplastic and immunosuppressive drugs, initial encounter: Secondary | ICD-10-CM

## 2017-03-18 DIAGNOSIS — C50212 Malignant neoplasm of upper-inner quadrant of left female breast: Secondary | ICD-10-CM

## 2017-03-18 DIAGNOSIS — Z170 Estrogen receptor positive status [ER+]: Secondary | ICD-10-CM | POA: Diagnosis not present

## 2017-03-18 DIAGNOSIS — D709 Neutropenia, unspecified: Secondary | ICD-10-CM | POA: Diagnosis not present

## 2017-03-18 DIAGNOSIS — Z8542 Personal history of malignant neoplasm of other parts of uterus: Secondary | ICD-10-CM

## 2017-03-18 LAB — CBC WITH DIFFERENTIAL/PLATELET
BASO%: 1.3 % (ref 0.0–2.0)
Basophils Absolute: 0 10*3/uL (ref 0.0–0.1)
EOS%: 7.1 % — ABNORMAL HIGH (ref 0.0–7.0)
Eosinophils Absolute: 0.3 10*3/uL (ref 0.0–0.5)
HCT: 38.2 % (ref 34.8–46.6)
HGB: 12.5 g/dL (ref 11.6–15.9)
LYMPH%: 22.2 % (ref 14.0–49.7)
MCH: 28.7 pg (ref 25.1–34.0)
MCHC: 32.8 g/dL (ref 31.5–36.0)
MCV: 87.6 fL (ref 79.5–101.0)
MONO#: 0.5 10*3/uL (ref 0.1–0.9)
MONO%: 13.9 % (ref 0.0–14.0)
NEUT#: 2.2 10*3/uL (ref 1.5–6.5)
NEUT%: 55.5 % (ref 38.4–76.8)
PLATELETS: 228 10*3/uL (ref 145–400)
RBC: 4.36 10*6/uL (ref 3.70–5.45)
RDW: 15.2 % — ABNORMAL HIGH (ref 11.2–14.5)
WBC: 3.9 10*3/uL (ref 3.9–10.3)
lymph#: 0.9 10*3/uL (ref 0.9–3.3)

## 2017-03-18 MED ORDER — LOSARTAN POTASSIUM 100 MG PO TABS: 100.0000 mg | ORAL_TABLET | Freq: Every day | ORAL | 1 refills | Status: DC

## 2017-03-18 NOTE — Telephone Encounter (Signed)
Gave patient avs report and appointments for August 2019

## 2017-03-18 NOTE — Progress Notes (Signed)
[place] OFFICE PROGRESS NOTE  Patient Care Team: Carollee Herter, Alferd Apa, DO as PCP - General (Family Medicine) Heath Lark, MD as Consulting Physician (Hematology and Oncology) Everitt Amber, MD as Consulting Physician (Obstetrics and Gynecology) Michael [name], MD as Consulting Physician (General Surgery) Alexis Frock, MD as Consulting Physician (Urology) Altheimer, Legrand Como, MD as Consulting Physician (Endocrinology) Katy Apo, MD as Consulting Physician (Ophthalmology) Stark Klein, MD as Consulting Physician (General Surgery) Irene Limbo, MD as Consulting Physician (Plastic Surgery)  SUMMARY OF ONCOLOGIC HISTORY: Oncology History   Breast cancer of upper-inner quadrant of left female breast [facility])   Staging form: Breast, AJCC 7th Edition     Clinical stage from 12/31/2015: Stage IIA (T2, N0, M0) - Signed by Heath Lark, MD on 12/31/2015  ER,PR positive Her2 neg     History of ovarian & endometrial cancer   10/15/2004 Initial Diagnosis    History of ovarian cancer, treated with chemotherapy carbo/Taxol      03/17/2009 Bone Marrow Biopsy    Bone marrow biopsy at [facility] showed neutropenia      01/23/2014 Bone Marrow Biopsy    Repeat bone marrow biopsy showed neutropenia      09/12/2014 Imaging    CT scan showed possible cancer      10/14/2014 Imaging    MRI show significant pelvic mass without invasion into the bladder but abutting to the rectum      11/19/2014 Surgery    she underwent surgery and had robotic-assisted lysis of adhesions, converted to laparotomy, radical upper vaginectomy and low anterior resection with colostomy. 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 Resection margins were negative      01/29/2015 - 03/10/2015 Radiation Therapy     She received adjuvant radiation      03/03/2015 Imaging    CT scan of the abdomen and pelvis showed status post interval removal of the bilateral nephroureteral stents. Worsening moderate right hydroureteronephrosis. Resolved left hydroureteronephrosis.      03/20/2015 Procedure    she had cystoscopy and stent placement for right hydronephrosis      05/02/2015 Surgery    Cystoscopy with right retrograde pyelogram interpretation. Diagnostic ureteroscopy. Removal of right ureteral stent.      05/14/2015 Imaging    CT scan of the abdomen and pelvis show unilateral right hydronephrosis with no residual cancer      08/18/2015 Imaging    CT scan showed hysterectomy with stable presacral soft tissue thickening. No definitive evidence of recurrent or metastatic disease. Severe right hydronephrosis      09/12/2015 Surgery    Cystoscopy with right retrograde pyelogram interpretation. Right ureteral stent placement 5 x 24 Polaris, no tether      10/07/2016 Imaging    Stable exam.  No new or progressive findings. 2. Stable appearance abnormal presacral soft tissue compatible with post treatment change. 3. Internal right ureteral stent with persistent right hydroureteronephrosis. Differentially decreased perfusion of the right kidney suggests a component of underlying obstructive uropathy. 4. Stable 13 mm left adrenal nodule, previously characterized as adenoma.        01/27/2017 Imaging    Stable postop and post radiation changes in presacral region. No evidence of recurrent or metastatic carcinoma within the chest, abdomen, or pelvis. Stable mild to moderate right hydroureteronephrosis, with right ureteral stent in appropriate position. Stable  small benign left adrenal adenoma and left thyroid lobe nodule.       Breast cancer of upper-inner quadrant of left female breast ([place])   10/14/2015 Imaging    Screening mammogram showed possible distortion in the left breast.      10/24/2015 Imaging     Targeted ultrasound is performed, showing a 0.6 x 0.8 x 0.9 cm area of hypoechoic distortion at the 10 o'clock position of the left breast 5 cm from the nipple      10/24/2015 Imaging    Diagnostic imaging confirmed 0.6 x 0.8 x 0.9 cm distortion in the upper inner left breast      10/30/2015 Procedure    Left US guided biopsy was performed      10/30/2015 Pathology Results    Accession: [id number] showed invasive ductal carcinoma, ER/PR positive, Her2 neg      11/05/2015 Genetic Testing    Genetic testing did not reveal a deleterious mutation.  Genetic testing did detect a Variant of Unknown Significance in the MSH6 gene called [id number]..  Genes tested include: APC, ATM, AXIN2, BARD1, BMPR1A, BRCA1, BRCA2, BRIP1, CDH1, CDKN2A, CHEK2, DICER1, EPCAM, GREM1, KIT, MEN1, MLH1, MSH2, MSH6, MUTYH, NBN, NF1, PALB2, PDGFRA, PMS2, POLD1, POLE, PTEN, RAD50, RAD51C, RAD51D, SDHA, SDHB, SDHC, SDHD, SMAD4, SMARCA4. STK11, TP53, TSC1, TSC2, and VHL.      12/11/2015 Surgery    She undwerwent bilateral mastectomy, left SLN biopsy and immediate reconstruction surgeries with bilateral plasma expanders      12/11/2015 Pathology Results    Accession: [id number] mastectomy specimens showed left breast ca, pT2N0M0      12/26/2015 Pathology Results    Accession: [id number] specimen from debridement showed no cancer      12/26/2015 Surgery    she underwent surgical debridement of necrotic tissue at site of plasma expander      01/12/2016 Imaging    Bone density is normal      02/02/2016 -  Anti-estrogen oral therapy    She started taking Arimidex       INTERVAL HISTORY: Please see below for problem oriented charting. She returns for further follow-up She feels well No recent infection She is giving herself subcutaneous injection of Neupogen every 5-6 days to maintain adequate white blood cell count From the breast cancer standpoint,She denies any recent abnormal breast examination, palpable mass,  abnormal breast appearance or nipple changes She complained of some hot flashes from Arimidex  REVIEW OF SYSTEMS:   Constitutional: Denies fevers, chills or abnormal weight loss Eyes: Denies blurriness of vision Ears, nose, mouth, throat, and face: Denies mucositis or sore throat Respiratory: Denies cough, dyspnea or wheezes Cardiovascular: Denies palpitation, chest discomfort or lower extremity swelling Gastrointestinal:  Denies nausea, heartburn or change in bowel habits Skin: Denies abnormal skin rashes Lymphatics: Denies new lymphadenopathy or easy bruising Neurological:Denies numbness, tingling or new weaknesses Behavioral/Psych: Mood is stable, no new changes  All other systems were reviewed with the patient and are negative.  I have reviewed the past medical history, past surgical history, social history and family history with the patient and they are unchanged from previous note.  ALLERGIES:  is allergic to penicillins; ultram [tramadol]; adhesive [tape]; cefaclor; erythromycin; trimethoprim; pectin; and sulfa antibiotics.  MEDICATIONS:  Current Outpatient Prescriptions  Medication Sig Dispense Refill  . anastrozole (ARIMIDEX) 1 MG tablet TAKE 1 TABLET DAILY 90 tablet 3  . Biotin 5 MG TABS Take 5 mg by mouth every morning.     . Calcium-Magnesium-Vitamin D 400-166.7-133.3  MG-MG-UNIT TABS Take 1 tablet by mouth daily.     . Cholecalciferol (VITAMIN D3) 10000 UNITS capsule Take 10,000 Units by mouth once a week. Sunday evening's    . diphenhydrAMINE (BENADRYL) 25 MG tablet Take 25 mg by mouth every 4 (four) hours as needed for itching, allergies or sleep. Reported on 09/15/2015    . filgrastim (NEUPOGEN) 480 MCG/1.6ML injection Inject 1.6 ml under the skin every 5 days for life 16 mL 11  . levothyroxine (SYNTHROID, LEVOTHROID) 150 MCG tablet Take 150 mcg by mouth daily.    [name] Kitchen loratadine (CLARITIN) 10 MG tablet Take 10 mg by mouth every morning.     [name] Kitchen losartan (COZAAR) 100 MG  tablet Take 1 tablet (100 mg total) by mouth daily. 90 tablet 1  . metFORMIN (GLUCOPHAGE) 1000 MG tablet Take 1,000 mg by mouth 2 (two) times daily with a meal.     . omega-3 acid ethyl esters (LOVAZA) 1 G capsule Take 1 g by mouth 2 (two) times daily.    Vladimir Faster Glycol-Propyl Glycol (SYSTANE OP) Place 1 drop into both eyes at bedtime.     . Prenatal Vit-Fe Fumarate-FA (PRENATAL VITAMIN PO) Take 1 capsule by mouth daily.    . rosuvastatin (CRESTOR) 10 MG tablet Take 10 mg by mouth every evening.     . sitaGLIPtin (JANUVIA) 100 MG tablet Take 100 mg by mouth every morning.     . valsartan (DIOVAN) 80 MG tablet Take 1 tablet (80 mg total) by mouth daily. (Patient taking differently: Take 80 mg by mouth every morning. )     No current facility-administered medications for this visit.     PHYSICAL EXAMINATION: ECOG PERFORMANCE STATUS: 1 - Symptomatic but completely ambulatory  Vitals:   03/18/17 1430  BP: 123/74  Pulse: 65  Resp: 18  Temp: 98.8 F (37.1 C)  SpO2: 100%   Filed Weights   03/18/17 1430  Weight: 212 lb (96.2 kg)    GENERAL:alert, no distress and comfortable SKIN: skin color, texture, turgor are normal, no rashes or significant lesions EYES: normal, Conjunctiva are pink and non-injected, sclera clear OROPHARYNX:no exudate, no erythema and lips, buccal mucosa, and tongue normal  NECK: supple, thyroid normal size, non-tender, without nodularity LYMPH:  no palpable lymphadenopathy in the cervical, axillary or inguinal LUNGS: clear to auscultation and percussion with normal breathing effort HEART: regular rate & rhythm and no murmurs and no lower extremity edema ABDOMEN:abdomen soft, non-tender and normal bowel sounds.  Stoma in situ Musculoskeletal:no cyanosis of digits and no clubbing  NEURO: alert & oriented x 3 with fluent speech, no focal motor/sensory deficits Chest wall examination revealed well-healed mastectomy scar bilaterally with implants in  situ.  LABORATORY DATA:  I have reviewed the data as listed    Component Value Date/Time   NA 141 01/24/2017 1228   K 4.6 01/24/2017 1228   CL 103 10/27/2016 0641   CO2 29 01/24/2017 1228   GLUCOSE 84 01/24/2017 1228   BUN 22.2 01/24/2017 1228   CREATININE 0.8 01/24/2017 1228   CALCIUM 10.2 01/24/2017 1228   PROT 6.9 01/24/2017 1228   ALBUMIN 3.4 (L) 01/24/2017 1228   AST 16 01/24/2017 1228   ALT 14 01/24/2017 1228   ALKPHOS 90 01/24/2017 1228   BILITOT 0.34 01/24/2017 1228   GFRNONAA 58 (L) 04/14/2016 1200   GFRNONAA 78 12/16/2014 1530   GFRAA >60 04/14/2016 1200   GFRAA >89 12/16/2014 1530    No results found for: SPEP, UPEP  Lab Results  Component Value Date   WBC 3.9 03/18/2017   NEUTROABS 2.2 03/18/2017   HGB 12.5 03/18/2017   HCT 38.2 03/18/2017   MCV 87.6 03/18/2017   PLT 228 03/18/2017      Chemistry      Component Value Date/Time   NA 141 01/24/2017 1228   K 4.6 01/24/2017 1228   CL 103 10/27/2016 0641   CO2 29 01/24/2017 1228   BUN 22.2 01/24/2017 1228   CREATININE 0.8 01/24/2017 1228      Component Value Date/Time   CALCIUM 10.2 01/24/2017 1228   ALKPHOS 90 01/24/2017 1228   AST 16 01/24/2017 1228   ALT 14 01/24/2017 1228   BILITOT 0.34 01/24/2017 1228     ASSESSMENT & PLAN:  Breast cancer of upper-inner quadrant of left female breast ([place]) Her examination reveals some mild scarring and implant in situ CT scan done in June is reassuring She will continue Arimidex for minimum 5 years She will be due for bone density scan next year  Hot flashes related to aromatase inhibitor therapy She has hot flashes but it is not debilitating Continue conservative management  History of ovarian & endometrial cancer Her recent CT scan of chest, abdomen and pelvis is reviewed myself, which showed no evidence of disease The patient will be undergoing reversal surgery in the near future  Chronic neutropenia Her CBC today is stable I recommend frequent  injection to every 5-6 days. I refilled her prescription of GCSF. For future surgery, due to her history of recurrent infection, I recommend we titrate the dosage of Neupogen to keep a consistently greater than 1.5 in the perioperative setting.   Orders Placed This Encounter  Procedures  . DG Bone Density    Standing Status:   Future    Standing Expiration Date:   04/22/2018    Order Specific Question:   Reason for exam:    Answer:   osteopenia    Order Specific Question:   Preferred imaging location?    Answer:   [facility]   All questions were answered. The patient knows to call the clinic with any problems, questions or concerns. No barriers to learning was detected. I spent 15 minutes counseling the patient face to face. The total time spent in the appointment was 20 minutes and more than 50% was on counseling and review of test results     Heath Lark, MD 03/18/2017 2:58 PM

## 2017-03-18 NOTE — Assessment & Plan Note (Signed)
Her recent CT scan of chest, abdomen and pelvis is reviewed myself, which showed no evidence of disease The patient will be undergoing reversal surgery in the near future

## 2017-03-18 NOTE — Assessment & Plan Note (Addendum)
Her CBC today is stable I recommend frequent injection to every 5-6 days. I refilled her prescription of GCSF. For future surgery, due to her history of recurrent infection, I recommend we titrate the dosage of Neupogen to keep a consistently greater than 1.5 in the perioperative setting.

## 2017-03-18 NOTE — Assessment & Plan Note (Signed)
She has hot flashes but it is not debilitating Continue conservative management

## 2017-03-18 NOTE — Assessment & Plan Note (Signed)
Her examination reveals some mild scarring and implant in situ CT scan done in June is reassuring She will continue Arimidex for minimum 5 years She will be due for bone density scan next year

## 2017-03-23 NOTE — Progress Notes (Signed)
Please place orders in EPIC as patient has a pre-op appointment on 03/28/2017! Thank you!

## 2017-03-24 NOTE — Progress Notes (Signed)
Please place orders in EPIC as patient has a pre-op appointment on 03/28/2017! Thank you!

## 2017-03-25 NOTE — Progress Notes (Signed)
Please place orders in EPIC as patient has a pre-op appointment on 03/28/2017! Thank you!

## 2017-03-25 NOTE — Progress Notes (Addendum)
CBCdiff 03-18-17 epic  CT chest 01-27-17 epic   Hemoglobin A1C (5.4) on 02-28-17 epic care everywhere

## 2017-03-25 NOTE — Patient Instructions (Addendum)
[name]  03/25/2017   Your procedure is scheduled on: 04-01-17  Report to [facility] Main  Entrance Take [place]  elevators to 3rd floor to  [place] at 530AM.    Call this number if you have problems the morning of surgery [phone]    Remember: ONLY 1 PERSON MAY GO WITH YOU TO SHORT STAY TO GET  READY MORNING OF [place].  Do not eat food or drink liquids :After Midnight.     Take these medicines the morning of surgery with A SIP OF WATER: levothyroxine(synthroid), loratadine                                 You may not have any metal on your body including hair pins and              piercings  Do not wear jewelry, make-up, lotions, powders or perfumes, deodorant             Do not wear nail polish.  Do not shave  48 hours prior to surgery.                 Do not bring valuables to the hospital. [place].  Contacts, dentures or bridgework may not be worn into surgery.  Leave suitcase in the car. After surgery it may be brought to your room.                Please read over the following fact sheets you were given: _____________________________________________________________________             How to Manage Your Diabetes Before and After Surgery  Why is it important to control my blood sugar before and after surgery? . Improving blood sugar levels before and after surgery helps healing and can limit problems. . A way of improving blood sugar control is eating a healthy diet by: o  Eating less sugar and carbohydrates o  Increasing activity/exercise o  Talking with your doctor about reaching your blood sugar goals . High blood sugars (greater than 180 mg/dL) can raise your risk of infections and slow your recovery, so you will need to focus on controlling your diabetes during the weeks before surgery. . Make sure that the doctor who takes care of your diabetes knows about your  planned surgery including the date and location.  How do I manage my blood sugar before surgery? . Check your blood sugar at least 4 times a day, starting 2 days before surgery, to make sure that the level is not too high or low. o Check your blood sugar the morning of your surgery when you wake up and every 2 hours until you get to the Short Stay unit. . If your blood sugar is less than 70 mg/dL, you will need to treat for low blood sugar: o Do not take insulin. o Treat a low blood sugar (less than 70 mg/dL) with  cup of clear juice (cranberry or apple), 4 glucose tablets, OR glucose gel. o Recheck blood sugar in 15 minutes after treatment (to make sure it is greater than 70 mg/dL). If your blood sugar is not greater than 70 mg/dL on recheck, call [phone] for further instructions. . Report  your blood sugar to the short stay nurse when you get to Short Stay.  . If you are admitted to the hospital after surgery: o Your blood sugar will be checked by the staff and you will probably be given insulin after surgery (instead of oral diabetes medicines) to make sure you have good blood sugar levels. o The goal for blood sugar control after surgery is 80-180 mg/dL.   WHAT DO I DO ABOUT MY DIABETES MEDICATION?   . THE DAY BEFORE SURGERY, take SITAGLIPTIN(Januvia) and Metformin as normal       . THE MORNING OF SURGERY, Do not take oral diabetes medicines (pills) the morning of surgery.   Patient Signature:  Date:   Nurse Signature:  Date:   Reviewed and Endorsed by [facility] Patient Education Committee, August 2015   [facility] - Preparing for Surgery Before surgery, you can play an important role.  Because skin is not sterile, your skin needs to be as free of germs as possible.  You can reduce the number of germs on your skin by washing with CHG (chlorahexidine gluconate) soap before surgery.  CHG is an antiseptic cleaner which kills germs and bonds with the skin to continue killing  germs even after washing. Please DO NOT use if you have an allergy to CHG or antibacterial soaps.  If your skin becomes reddened/irritated stop using the CHG and inform your nurse when you arrive at Short Stay. Do not shave (including legs and underarms) for at least 48 hours prior to the first CHG shower.  You may shave your face/neck. Please follow these instructions carefully:  1.  Shower with CHG Soap the night before surgery and the  morning of Surgery.  2.  If you choose to wash your hair, wash your hair first as usual with your  normal  shampoo.  3.  After you shampoo, rinse your hair and body thoroughly to remove the  shampoo.                           4.  Use CHG as you would any other liquid soap.  You can apply chg directly  to the skin and wash                       Gently with a scrungie or clean washcloth.  5.  Apply the CHG Soap to your body ONLY FROM THE NECK DOWN.   Do not use on face/ open                           Wound or open sores. Avoid contact with eyes, ears mouth and genitals (private parts).                       Wash face,  Genitals (private parts) with your normal soap.             6.  Wash thoroughly, paying special attention to the area where your surgery  will be performed.  7.  Thoroughly rinse your body with warm water from the neck down.  8.  DO NOT shower/wash with your normal soap after using and rinsing off  the CHG Soap.                9.  Pat yourself dry with a clean towel.  10.  Wear clean pajamas.            11.  Place clean sheets on your bed the night of your first shower and do not  sleep with pets. Day of Surgery : Do not apply any lotions/deodorants the morning of surgery.  Please wear clean clothes to the hospital/surgery center.  FAILURE TO FOLLOW THESE INSTRUCTIONS MAY RESULT IN THE CANCELLATION OF YOUR SURGERY PATIENT SIGNATURE_________________________________  NURSE  SIGNATURE__________________________________  ________________________________________________________________________    CLEAR LIQUID DIET   Foods Allowed                                                                     Foods Excluded  Coffee and tea, regular and decaf                             liquids that you cannot  Plain Jell-O in any flavor                                             see through such as: Fruit ices (not with fruit pulp)                                     milk, soups, orange juice  Iced Popsicles                                    All solid food Carbonated beverages, regular and diet                                    Cranberry, grape and apple juices Sports drinks like Gatorade Lightly seasoned clear broth or consume(fat free) Sugar, honey syrup  Sample Menu Breakfast                                Lunch                                     Supper Cranberry juice                    Beef broth                            Chicken broth Jell-O                                     Grape juice                           Apple juice Coffee or tea  Jell-O                                      Popsicle                                                Coffee or tea                        Coffee or tea  _____________________________________________________________________

## 2017-03-28 ENCOUNTER — Encounter (HOSPITAL_COMMUNITY)
Admission: RE | Admit: 2017-03-28 | Discharge: 2017-03-28 | Disposition: A | Discharge status: Home / Self Care | Payer: BLUE CROSS/BLUE SHIELD | Source: Ambulatory Visit | Attending: Surgery | Admitting: Surgery

## 2017-03-28 ENCOUNTER — Encounter (HOSPITAL_COMMUNITY): Payer: Self-pay

## 2017-03-28 DIAGNOSIS — Z01812 Encounter for preprocedural laboratory examination: Secondary | ICD-10-CM | POA: Diagnosis not present

## 2017-03-28 DIAGNOSIS — I10 Essential (primary) hypertension: Secondary | ICD-10-CM | POA: Insufficient documentation

## 2017-03-28 DIAGNOSIS — E119 Type 2 diabetes mellitus without complications: Secondary | ICD-10-CM | POA: Insufficient documentation

## 2017-03-28 DIAGNOSIS — Z01810 Encounter for preprocedural cardiovascular examination: Secondary | ICD-10-CM | POA: Insufficient documentation

## 2017-03-28 LAB — GLUCOSE, CAPILLARY: GLUCOSE-CAPILLARY: 74 mg/dL (ref 65–99)

## 2017-03-28 LAB — BASIC METABOLIC PANEL WITH GFR
Anion gap: 8 (ref 5–15)
BUN: 24 mg/dL — ABNORMAL HIGH (ref 6–20)
CO2: 29 mmol/L (ref 22–32)
Calcium: 9.8 mg/dL (ref 8.9–10.3)
Chloride: 103 mmol/L (ref 101–111)
Creatinine, Ser: 0.73 mg/dL (ref 0.44–1.00)
GFR calc Af Amer: >60 mL/min
GFR calc non Af Amer: >60 mL/min
Glucose, Bld: 86 mg/dL (ref 65–99)
Potassium: 4.2 mmol/L (ref 3.5–5.1)
Sodium: 140 mmol/L (ref 135–145)

## 2017-03-28 NOTE — Consult Note (Signed)
[name] Nurse ostomy consult note  [place] Nurse requested for preoperative stoma site marking by Dr. Johney Maine. DOS is Friday, 04/01/17. Patient is known to me from her previous ostomy surgery two years ago. She is currently pleased with her ostomy pouching system Psychologist, [employer]) and gets approximately 3-4 days wear time from her pouching system. She is tearful when we discuss her upcoming surgery, her biggest concern is regarding learning something new, but also of complications.  Discussed surgical procedure and stoma creation with patient. She is amenable to learning more about the ileostomy after surgery on Friday.  Examined patient sitting and standing in order to place the marking in the patient's visual field, away from any creases or abdominal contour issues and within the rectus muscle.  Attempted to mark below the patient's belt line.   Marked for ileostomy in the RUQ  6.5cm to the right of the umbilicus and  9cm above/below the umbilicus. This is in line with her current colostomy, which is located in the LUQ.  Patient's abdomen cleansed with CHG wipes at site markings, allowed to air dry prior to marking.Covered mark with thin film transparent dressing to preserve mark until date of surgery.   [place] Nurse team will follow up with patient after surgery for continue ostomy care and teaching.   Thanks, [name] Flakes, MSN, RN, [place], Arther Abbott  Pager# [phone]

## 2017-03-29 ENCOUNTER — Ambulatory Visit: Payer: Self-pay | Admitting: Surgery

## 2017-03-29 NOTE — Progress Notes (Signed)
Patient at PAT appt verifies that she will be having Dr Tresa Moore work on her urology portion of surgery and that Dr Johney Maine will be doing her colostomy takedown and that she had already received colon prep info from his office.. Dr Lucy Antigua consent in epic signed at PAT , no orders in epic for Dr Johney Maine consent.

## 2017-03-29 NOTE — H&P (Signed)
[name] 12/20/2016 11:52 AM Location: [facility] Patient #: [id number] DOB: 1955-10-10 Married / Language: [name] / Race: White Female  Patient Care Team: Carollee Herter, Alferd Apa, DO as PCP - General (Family Medicine) Heath Lark, MD as Consulting Physician (Hematology and Oncology) Everitt Amber, MD as Consulting Physician (Obstetrics and Gynecology) Michael [name], MD as Consulting Physician (General Surgery) Alexis Frock, MD as Consulting Physician (Urology) Altheimer, Legrand Como, MD as Consulting Physician (Endocrinology) Katy Apo, MD as Consulting Physician (Ophthalmology) Stark Klein, MD as Consulting Physician (General Surgery) Irene Limbo, MD as Consulting Physician (Plastic Surgery)    History of Present Illness [name] Hector MD; 12/20/2016 5:56 PM) The patient is a 61 year old female presenting for a post-operative visit. Note for "Post-Operative": ` ` ` Patient sent for surgical consultation at the request of Dr. Denman [name]  Chief Complaint: Request for colostomy takedown. 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 Had recurrence of endometrial cancer and pelvis adherent to the vagina and rectum. Underwent partial vaginectomy and low anterior resection with end colostomy. Also ureteral stents and bladder repair. 11/19/2014. Patient recovered well & went home. Week and a half later, patient developed pain and swelling and redness. Refractory to oral antibiotics. Admitted with IV antibiotics. Began to express stool between the colostomy and abdominal wall. Under went lap & abdominal wall exploration with relocation of colostomy 12/04/2014.  She is now 2 years out. She comes today with her husband. She had post adjuvant radiation. Has not need chemotherapy. No evidence of recurrence. In getting stent exchanges for her right ureter. Some  hydronephrosis chronic clear. She's been discussing with urologist about perhaps preoperative right ureter surgery for reconstruction or reimplantation to avoid a chronic stent exchanges. Wondered if that can be a coordinated case. Patient is urinating fine. Try and stay hydrated. Empties her colostomy about a every other day. Does not smoking. Activity level back to normal. Appetite been good.  (Review of systems as stated in this history (HPI) or in the review of systems. 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 TWO LYMPH NODES, NEGATIVE FOR ATYPIA OR MALIGNANCY (0/2). RESECTION MARGINS, NEGATIVE FOR ATYPIA OR MALIGNANCY. Microscopic Comment Please see Christoper Bushey description for details. Aldona Bar MD Pathologist, Electronic Signature (Case signed 11/21/2014)       12/04/2014  PATIENT: [name] 61 y.o. female  Patient Care Team: Leana Gamer, MD as PCP - General (Internal Medicine) [name], MD as Referring Physician (Obstetrics and Gynecology) Heath Lark, MD as Consulting Physician (Hematology and Oncology) Everitt Amber, MD as Consulting Physician (Obstetrics and Gynecology) Michael [name], MD as Consulting Physician (General Surgery)  PRE-OPERATIVE DIAGNOSIS: Abdominal wall abscess, possible colostomy perforation/fistula  POST-OPERATIVE DIAGNOSIS:   Abdominal wall abscess secondary to colostomy perforation  PROCEDURE: Procedure(s): LAPROSCOPIC LYSIS OF ADHESIONS Laparoscopic mobilization of the splenic flexure RELOCATION OF COLOSTOMY DEBRIDEMENT INITIAL COLOSTOMY SITE  SURGEON: Surgeon(s): Michael [name], MD   11/19/2014  PATIENT: [name] 61 y.o. female  Patient Care Team: Leana Gamer, MD as PCP - General  (Internal Medicine) [name], MD as Referring Physician (Obstetrics and Gynecology) Heath Lark, MD as Consulting Physician (Hematology and Oncology) Everitt Amber, MD as Consulting Physician (Obstetrics and Gynecology)  PRE-OPERATIVE DIAGNOSIS: ENDOMETRIAL CANCER  POST-OPERATIVE DIAGNOSIS: 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  PROCEDURE:  LAPAROSCOPIC/ROBOTIC LYSIS OF ADHESIONS*^ EXPLORATORY LAPAROTOMY^ RESECTION OF PELVIC MASS^ RADICAL UPPER VAGINECTOMY^ LOW ANTERIOR RECTOSIGMOID RESECTION* END DESCENDING COLOSTOMY* BILATERAL URETERAL STENT PLACEMENT^ CYSTOTOMY CLOSURE^   SURGEON: : Everitt Amber, MD^ Michael [name], MD*  OR FINDINGS:  Patient had bulky tumor in the mid pelvis especially along the right lateral pelvic wall. Smooth encapsulation along the lateral sidewall but very thin margin. Anterior rectum and posterior vaginal wall densely adherent to it. 5 x 5 x 5 cm region. Some central necrosis.  No obvious metastatic disease on visceral parietal peritoneum or liver.  Patient has a end colostomy. There is a 1 cm rectal stump   Allergies [name], [name]; 12/20/2016 11:52 AM) Trimethoprim  Erythromycin *DERMATOLOGICALS*  Adhesive Tape 1"x5yd *MEDICAL DEVICES AND SUPPLIES*  Rash. Cipro *FLUOROQUINOLONES*  Penicillins  Swelling. Sulfa Antibiotics  Rash. Allergies Reconciled   Medication History [name], [name]; 12/20/2016 11:53 AM) Illusions AA Breast Prosthesis (1 (one)) Active. [phone] Post-mastectomy camisole - to hold drain tubes and wear during healing.; QTY: 2; Length of use: 3 Months 3 Refills ; L8000 Post-surgical bras - to hold breast prosthesis ; QTY: 6; Length of use:3 Months 3 Refills ; L8020Non-silicone breast prosthesis - for temporary use during healing or for comfort at end of day, lighter weight, cooler for hot weather.; QTY: 1 for single, 2 for bilateral ; Length of use: 6 months Up to 1 refill; Replacement ; B9038 Silicone Breast  Prosthesis - to restore balance and symmetry after breast surgery; QTY: 1 for single, 2 for Bilateral; Length of use: 2 years No refills) Biotin (5MG  Tablet, Oral) Active. Calcium-Magnesium-Vitamin D (Oral) Specific strength unknown - Active. Vitamin D3 (50000UNIT Tablet, Oral once a week) Active. Benadryl Allergy (25MG  Tablet, Oral as needed) Active. Hyaluronate Sodium (Emollient) (0.1% Lotion, External as needed) Active. Claritin (10MG  Tablet, Oral) Active. MetFORMIN HCl (1000MG  Tablet, Oral) Active. Multivitamin (Oral) Active. Omega 3 (Oral) Specific strength unknown - Active. Omeprazole (20MG  Capsule DR, Oral) Active. MiraLax (Oral as needed) Active. Arimidex (1MG  Tablet, Oral as directed) Active. Synthroid (175MCG Tablet, Oral) Active. Valsartan (320MG  Tablet, Oral) Active. Januvia (100MG  Tablet, Oral) Active. Crestor (10MG  Tablet, Oral) Active. Medications Reconciled  Vitals [employer] RMA; 12/20/2016 11:54 AM) 12/20/2016 11:53 AM Weight: 210 lb Height: 62in Body Surface Area: 1.95 m Body Mass Index: 38.41 kg/m  Temp.: 98.68F  Pulse: 75 (Regular)  P.OX: 99% (Room air) BP: 110/70 (Sitting, Left Arm, Standard)       Physical Exam [name] Hector MD; 12/20/2016 12:17 PM) General Mental Status-Alert. General Appearance-Not in acute distress. Voice-Normal.  Integumentary Global Assessment Normal Exam - Distribution of scalp and body hair is normal. General Characteristics Overall Skin Surface - no rashes and no suspicious lesions.  Head and Neck Head-normocephalic, atraumatic with no lesions or palpable masses. Face Global Assessment - atraumatic, no absence of expression. Neck Global Assessment - no abnormal movements, no decreased range of motion. Trachea-midline. Thyroid Gland Characteristics - non-tender.  Eye Eyeball - Left-Extraocular movements intact, No Nystagmus. Eyeball - Right-Extraocular movements intact,  No Nystagmus. Upper Eyelid - Left-No Cyanotic. Upper Eyelid - Right-No Cyanotic. Note: Chronic RIGHT eyelid partial droop stable. Wears glasses. Vision corrected   Chest and Lung Exam Inspection Accessory muscles - No use of accessory muscles in breathing.  Abdomen Note: Morbidly obese but soft. Robotic port site incisions well-healed. Ostomy LEFT upper quadrant pink with much less edema. No more induration. No cellulitis.  LEFT lower quadrant  former colostomy wound with wound VAC sponge in place. Removed. Wound is very superficial now. 5 mm deep at the most. 5 x 4 cm at surface. Switched over to packing.   Female Genitourinary Note: Foreshortened vagina but soft smooth posterior vaginal wall. Intact rectovaginal septum. No obvious masses or tumors.   Rectal Note: Perianal skin clean. Intact sphincter tone. 2-2.5 cm rectal cuff soft and intact. No obvious mass.   Peripheral Vascular Upper Extremity Inspection - Left - Not Gangrenous, No Petechiae. Right - Not Gangrenous, No Petechiae.  Neurologic Neurologic evaluation reveals -normal attention span and ability to concentrate, able to name objects and repeat phrases. Appropriate fund of knowledge and normal coordination.  Neuropsychiatric Mental status exam performed with findings of-able to articulate well with normal speech/language, rate, volume and coherence and no evidence of hallucinations, delusions, obsessions or homicidal/suicidal ideation. Orientation-oriented X3.  Musculoskeletal Global Assessment Gait and Station - normal gait and station.  Lymphatic General Lymphatics Description - No Generalized lymphadenopathy.    Assessment & Plan [name] Hector MD; 12/20/2016 5:58 PM) ENDOMETRIAL CANCER (C54.1) Impression: Recurrent endometrial cancer requiring low anterior resection and partial vaginectomy with ureteral reimplantation and bladder repair.  She does have a little cuff left that I  could attempt to colostomy takedown. Would be coloanal stapled anastomosis. Not a classic J-pouch in side: 2 and, anal region.  Think is reasonable do laparoscopic lysis adhesions and see if I can get down to the region to make sure it healthy and clear. With that though, would need loop ileostomy diversion to help protect the anastomosis. Then takedown ileostomy 3 months later. May need IV fluids in the first 6 weeks to minimize dehydration. Antidiarrhea regimen.  I believe her urologist Dr. Tresa Moore would like to try and address the right hydronephrosis and chronic right ureter. See if ureteral reconstruction/reimplantation could be done. Reasonable to try and coordinate at the same time. Current Plans Pt Education - [facility] Pelvic Floor Exercises (Kegels) and Dysfunction HCI (Khairi Garman) COLOSTOMY IN PLACE (Z93.3) Impression: 2 years out from resection recurrent endometrial cancer with no evidence of recurrent disease.  She has a short but intact anorectal cuff. I think reasonable to attempt colostomy takedown. Plan a laparoscopic lysis of adhesions. Could do a robotically if that was what Dr. Tresa Moore was plan to do anyway. Most likely a side colon to end rectal stump like a pseudo J pouch.  She will need a protective loop ileostomy. Then plan ileostomy takedown in 3 months.  May benefit from pelvic floor physical therapy before and after ileostomy takedown to minimize issues of urgency or other discomfort.  We will work to coordinate possible surgery with Dr. Tresa Moore. I believe he wishes to address the right ureter to see if it can be reimplanted or bridged, etc. Would add more operative time but hopefully can address both issues at once. PREOP COLON - ENCOUNTER FOR PREOPERATIVE EXAMINATION FOR GENERAL SURGICAL PROCEDURE (Z01.818) Current Plans You are being scheduled for surgery- Our schedulers will call you.  You should hear from our office's scheduling department within 5 working days about the  location, date, and time of surgery. We try to make accommodations for patient's preferences in scheduling surgery, but sometimes the OR schedule or the surgeon's schedule prevents [place] from making those accommodations.  If you have not heard from our office [phone]) in 5 working days, call the office and ask for your surgeon's nurse.  If you have other questions about your diagnosis, plan, or surgery, call  the office and ask for your surgeon's nurse.  Written instructions provided Pt Education - [facility] Colon Bowel Prep 2015 Miralax/Antibiotics Restarted Neomycin Sulfate 500MG , 2 (two) Tablet Tablet SEE NOTE, #6, 12/20/2016, No Refill. Local Order: TAKE TWO TABLETS AT 2 PM, 3 PM, AND 10 PM THE DAY PRIOR TO SURGERY Restarted Flagyl 500MG , 2 (two) Tablet Tablet SEE NOTE, #6, 12/20/2016, No Refill. Local Order: Take at 2pm, 3pm, and 10pm the day prior to your colon operation The anatomy & physiology of the digestive tract was discussed. The pathophysiology of the colon was discussed. Natural history risks without surgery was discussed. I feel the risks of no intervention will lead to serious problems that outweigh the operative risks; therefore, I recommended a partial colectomy to remove the pathology. Minimally invasive (Robotic/Laparoscopic) & open techniques were discussed.  Risks such as bleeding, infection, abscess, leak, reoperation, possible ostomy, hernia, heart attack, death, and other risks were discussed. I noted a good likelihood this will help address the problem. Goals of post-operative recovery were discussed as well. Need for adequate nutrition, daily bowel regimen and healthy physical activity, to optimize recovery was noted as well. We will work to minimize complications. Educational materials were available as well. Questions were answered. The patient expresses understanding & wishes to proceed with surgery.  Pt Education - [facility] Colectomy post-op instructions:  discussed with patient and provided information. 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 Tresa Moore.  He is planning for potential excision,reimplantation, or reconstruction of the right ureteral stricture around the time of colostomy takedown. We'll try and coordinate with him.  [name], M.D., F.A.C.S. Gastrointestinal and Minimally Invasive Surgery [facility] [address] [place], [place] [address] [phone] Main / Paging

## 2017-03-31 MED ORDER — GENTAMICIN SULFATE 40 MG/ML IJ SOLN
INTRAMUSCULAR | Status: DC
  Filled 2017-03-31: qty 6

## 2017-03-31 MED ORDER — CLINDAMYCIN PHOSPHATE 900 MG/50ML IV SOLN
900.0000 mg | INTRAVENOUS | Status: AC
  Administered 2017-04-01 (×2): 900 mg via INTRAVENOUS

## 2017-03-31 MED ORDER — GENTAMICIN SULFATE 40 MG/ML IJ SOLN
340.0000 mg | INTRAVENOUS | Status: AC
  Administered 2017-04-01: 340 mg via INTRAVENOUS
  Filled 2017-03-31 (×2): qty 8.5

## 2017-04-01 ENCOUNTER — Inpatient Hospital Stay (HOSPITAL_COMMUNITY): Payer: BLUE CROSS/BLUE SHIELD | Admitting: Anesthesiology

## 2017-04-01 ENCOUNTER — Inpatient Hospital Stay (HOSPITAL_COMMUNITY): Payer: BLUE CROSS/BLUE SHIELD

## 2017-04-01 ENCOUNTER — Inpatient Hospital Stay (HOSPITAL_COMMUNITY)
Admission: RE | Admit: 2017-04-01 | Discharge: 2017-04-07 | DRG: 326 | Disposition: A | Discharge status: Home Health | Payer: BLUE CROSS/BLUE SHIELD | Source: Ambulatory Visit | Attending: Surgery | Admitting: Surgery

## 2017-04-01 ENCOUNTER — Encounter (HOSPITAL_COMMUNITY)
Admission: RE | Disposition: A | Discharge status: Home Health | Payer: Self-pay | Source: Ambulatory Visit | Attending: Surgery

## 2017-04-01 ENCOUNTER — Encounter (HOSPITAL_COMMUNITY): Payer: Self-pay | Admitting: *Deleted

## 2017-04-01 DIAGNOSIS — R1900 Intra-abdominal and pelvic swelling, mass and lump, unspecified site: Secondary | ICD-10-CM | POA: Diagnosis present

## 2017-04-01 DIAGNOSIS — Z8542 Personal history of malignant neoplasm of other parts of uterus: Secondary | ICD-10-CM | POA: Diagnosis not present

## 2017-04-01 DIAGNOSIS — J9600 Acute respiratory failure, unspecified whether with hypoxia or hypercapnia: Secondary | ICD-10-CM

## 2017-04-01 DIAGNOSIS — N179 Acute kidney failure, unspecified: Secondary | ICD-10-CM | POA: Diagnosis not present

## 2017-04-01 DIAGNOSIS — E782 Mixed hyperlipidemia: Secondary | ICD-10-CM | POA: Diagnosis present

## 2017-04-01 DIAGNOSIS — G92 Toxic encephalopathy: Secondary | ICD-10-CM | POA: Diagnosis not present

## 2017-04-01 DIAGNOSIS — Z923 Personal history of irradiation: Secondary | ICD-10-CM | POA: Diagnosis not present

## 2017-04-01 DIAGNOSIS — Z433 Encounter for attention to colostomy: Principal | ICD-10-CM

## 2017-04-01 DIAGNOSIS — R571 Hypovolemic shock: Secondary | ICD-10-CM | POA: Diagnosis not present

## 2017-04-01 DIAGNOSIS — Z9882 Breast implant status: Secondary | ICD-10-CM

## 2017-04-01 DIAGNOSIS — K567 Ileus, unspecified: Secondary | ICD-10-CM | POA: Diagnosis not present

## 2017-04-01 DIAGNOSIS — C541 Malignant neoplasm of endometrium: Secondary | ICD-10-CM | POA: Diagnosis not present

## 2017-04-01 DIAGNOSIS — N1330 Unspecified hydronephrosis: Secondary | ICD-10-CM | POA: Diagnosis present

## 2017-04-01 DIAGNOSIS — Z79811 Long term (current) use of aromatase inhibitors: Secondary | ICD-10-CM

## 2017-04-01 DIAGNOSIS — R198 Other specified symptoms and signs involving the digestive system and abdomen: Secondary | ICD-10-CM

## 2017-04-01 DIAGNOSIS — Z833 Family history of diabetes mellitus: Secondary | ICD-10-CM

## 2017-04-01 DIAGNOSIS — D709 Neutropenia, unspecified: Secondary | ICD-10-CM | POA: Diagnosis not present

## 2017-04-01 DIAGNOSIS — Z803 Family history of malignant neoplasm of breast: Secondary | ICD-10-CM

## 2017-04-01 DIAGNOSIS — C50212 Malignant neoplasm of upper-inner quadrant of left female breast: Secondary | ICD-10-CM | POA: Diagnosis present

## 2017-04-01 DIAGNOSIS — Z9889 Other specified postprocedural states: Secondary | ICD-10-CM

## 2017-04-01 DIAGNOSIS — N135 Crossing vessel and stricture of ureter without hydronephrosis: Secondary | ICD-10-CM | POA: Diagnosis present

## 2017-04-01 DIAGNOSIS — Z8249 Family history of ischemic heart disease and other diseases of the circulatory system: Secondary | ICD-10-CM

## 2017-04-01 DIAGNOSIS — K219 Gastro-esophageal reflux disease without esophagitis: Secondary | ICD-10-CM | POA: Diagnosis present

## 2017-04-01 DIAGNOSIS — Z932 Ileostomy status: Secondary | ICD-10-CM

## 2017-04-01 DIAGNOSIS — D72829 Elevated white blood cell count, unspecified: Secondary | ICD-10-CM | POA: Diagnosis present

## 2017-04-01 DIAGNOSIS — Z8041 Family history of malignant neoplasm of ovary: Secondary | ICD-10-CM

## 2017-04-01 DIAGNOSIS — Z170 Estrogen receptor positive status [ER+]: Secondary | ICD-10-CM

## 2017-04-01 DIAGNOSIS — Z452 Encounter for adjustment and management of vascular access device: Secondary | ICD-10-CM | POA: Diagnosis not present

## 2017-04-01 DIAGNOSIS — E11319 Type 2 diabetes mellitus with unspecified diabetic retinopathy without macular edema: Secondary | ICD-10-CM | POA: Diagnosis present

## 2017-04-01 DIAGNOSIS — Z808 Family history of malignant neoplasm of other organs or systems: Secondary | ICD-10-CM | POA: Diagnosis not present

## 2017-04-01 DIAGNOSIS — I10 Essential (primary) hypertension: Secondary | ICD-10-CM | POA: Diagnosis present

## 2017-04-01 DIAGNOSIS — Z4659 Encounter for fitting and adjustment of other gastrointestinal appliance and device: Secondary | ICD-10-CM

## 2017-04-01 DIAGNOSIS — Z8543 Personal history of malignant neoplasm of ovary: Secondary | ICD-10-CM

## 2017-04-01 DIAGNOSIS — T4275XA Adverse effect of unspecified antiepileptic and sedative-hypnotic drugs, initial encounter: Secondary | ICD-10-CM | POA: Diagnosis not present

## 2017-04-01 DIAGNOSIS — E559 Vitamin D deficiency, unspecified: Secondary | ICD-10-CM | POA: Diagnosis present

## 2017-04-01 DIAGNOSIS — Z978 Presence of other specified devices: Secondary | ICD-10-CM | POA: Diagnosis present

## 2017-04-01 DIAGNOSIS — K432 Incisional hernia without obstruction or gangrene: Secondary | ICD-10-CM | POA: Diagnosis present

## 2017-04-01 DIAGNOSIS — D61818 Other pancytopenia: Secondary | ICD-10-CM | POA: Diagnosis present

## 2017-04-01 DIAGNOSIS — K660 Peritoneal adhesions (postprocedural) (postinfection): Secondary | ICD-10-CM | POA: Diagnosis present

## 2017-04-01 DIAGNOSIS — Z853 Personal history of malignant neoplasm of breast: Secondary | ICD-10-CM | POA: Diagnosis not present

## 2017-04-01 DIAGNOSIS — J95821 Acute postprocedural respiratory failure: Secondary | ICD-10-CM | POA: Diagnosis not present

## 2017-04-01 DIAGNOSIS — Z807 Family history of other malignant neoplasms of lymphoid, hematopoietic and related tissues: Secondary | ICD-10-CM

## 2017-04-01 DIAGNOSIS — Z6838 Body mass index (BMI) 38.0-38.9, adult: Secondary | ICD-10-CM

## 2017-04-01 DIAGNOSIS — Z9013 Acquired absence of bilateral breasts and nipples: Secondary | ICD-10-CM

## 2017-04-01 DIAGNOSIS — E119 Type 2 diabetes mellitus without complications: Secondary | ICD-10-CM | POA: Diagnosis not present

## 2017-04-01 DIAGNOSIS — Z7984 Long term (current) use of oral hypoglycemic drugs: Secondary | ICD-10-CM

## 2017-04-01 DIAGNOSIS — N8110 Cystocele, unspecified: Secondary | ICD-10-CM | POA: Diagnosis present

## 2017-04-01 DIAGNOSIS — E039 Hypothyroidism, unspecified: Secondary | ICD-10-CM | POA: Diagnosis present

## 2017-04-01 DIAGNOSIS — D649 Anemia, unspecified: Secondary | ICD-10-CM

## 2017-04-01 DIAGNOSIS — Z800 Family history of malignant neoplasm of digestive organs: Secondary | ICD-10-CM | POA: Diagnosis not present

## 2017-04-01 DIAGNOSIS — Z4682 Encounter for fitting and adjustment of non-vascular catheter: Secondary | ICD-10-CM | POA: Diagnosis not present

## 2017-04-01 DIAGNOSIS — E11649 Type 2 diabetes mellitus with hypoglycemia without coma: Secondary | ICD-10-CM | POA: Diagnosis present

## 2017-04-01 DIAGNOSIS — E890 Postprocedural hypothyroidism: Secondary | ICD-10-CM | POA: Diagnosis present

## 2017-04-01 DIAGNOSIS — Z7982 Long term (current) use of aspirin: Secondary | ICD-10-CM

## 2017-04-01 DIAGNOSIS — J9601 Acute respiratory failure with hypoxia: Secondary | ICD-10-CM | POA: Diagnosis not present

## 2017-04-01 DIAGNOSIS — C641 Malignant neoplasm of right kidney, except renal pelvis: Secondary | ICD-10-CM | POA: Diagnosis not present

## 2017-04-01 DIAGNOSIS — Z79899 Other long term (current) drug therapy: Secondary | ICD-10-CM

## 2017-04-01 DIAGNOSIS — R1030 Lower abdominal pain, unspecified: Secondary | ICD-10-CM

## 2017-04-01 HISTORY — PX: PROCTOSCOPY: SHX2266

## 2017-04-01 HISTORY — PX: CYSTOSCOPY WITH RETROGRADE PYELOGRAM, URETEROSCOPY AND STENT PLACEMENT: SHX5789

## 2017-04-01 HISTORY — DX: Ileostomy status: Z93.2

## 2017-04-01 HISTORY — PX: XI ROBOTIC ASSISTED LOWER ANTERIOR RESECTION: SHX6558

## 2017-04-01 LAB — POCT I-STAT EG7
ACID-BASE DEFICIT: 3 mmol/L — AB (ref 0.0–2.0)
Acid-base deficit: 3 mmol/L — ABNORMAL HIGH (ref 0.0–2.0)
Acid-base deficit: 6 mmol/L — ABNORMAL HIGH (ref 0.0–2.0)
BICARBONATE: 23.3 mmol/L (ref 20.0–28.0)
Bicarbonate: 23.1 mmol/L (ref 20.0–28.0)
Bicarbonate: 21.1 mmol/L (ref 20.0–28.0)
CALCIUM ION: 1.13 mmol/L — AB (ref 1.15–1.40)
Calcium, Ion: 1.16 mmol/L (ref 1.15–1.40)
Calcium, Ion: 1.17 mmol/L (ref 1.15–1.40)
HCT: 23 % — ABNORMAL LOW (ref 36.0–46.0)
HEMATOCRIT: 23 % — AB (ref 36.0–46.0)
HEMATOCRIT: 29 % — AB (ref 36.0–46.0)
Hemoglobin: 7.8 g/dL — ABNORMAL LOW (ref 12.0–15.0)
Hemoglobin: 7.8 g/dL — ABNORMAL LOW (ref 12.0–15.0)
Hemoglobin: 9.9 g/dL — ABNORMAL LOW (ref 12.0–15.0)
O2 SAT: 88 %
O2 Saturation: 78 %
O2 Saturation: 81 %
PH VEN: 7.308 (ref 7.250–7.430)
PO2 VEN: 42 mmHg (ref 32.0–45.0)
POTASSIUM: 3.6 mmol/L (ref 3.5–5.1)
POTASSIUM: 4 mmol/L (ref 3.5–5.1)
Patient temperature: 35
Potassium: 3.6 mmol/L (ref 3.5–5.1)
SODIUM: 139 mmol/L (ref 135–145)
SODIUM: 139 mmol/L (ref 135–145)
Sodium: 139 mmol/L (ref 135–145)
TCO2: 25 mmol/L (ref 22–32)
TCO2: 25 mmol/L (ref 22–32)
TCO2: 23 mmol/L (ref 22–32)
pCO2, Ven: 45.6 mmHg (ref 44.0–60.0)
pCO2, Ven: 43.4 mmHg — ABNORMAL LOW (ref 44.0–60.0)
pCO2, Ven: 44.6 mmHg (ref 44.0–60.0)
pH, Ven: 7.325 (ref 7.250–7.430)
pH, Ven: 7.272 (ref 7.250–7.430)
pO2, Ven: 44 mmHg (ref 32.0–45.0)
pO2, Ven: 56 mmHg — ABNORMAL HIGH (ref 32.0–45.0)

## 2017-04-01 LAB — CBC
HCT: 29.7 % — ABNORMAL LOW (ref 36.0–46.0)
Hemoglobin: 10 g/dL — ABNORMAL LOW (ref 12.0–15.0)
MCH: 28.9 pg (ref 26.0–34.0)
MCHC: 33.7 g/dL (ref 30.0–36.0)
MCV: 85.8 fL (ref 78.0–100.0)
PLATELETS: 240 10*3/uL (ref 150–400)
RBC: 3.46 MIL/uL — AB (ref 3.87–5.11)
RDW: 15.7 % — AB (ref 11.5–15.5)
WBC: 22.6 10*3/uL — ABNORMAL HIGH (ref 4.0–10.5)

## 2017-04-01 LAB — PREPARE RBC (CROSSMATCH)

## 2017-04-01 LAB — HEMOGLOBIN A1C
Hgb A1c MFr Bld: 5.3 % (ref 4.8–5.6)
MEAN PLASMA GLUCOSE: 105.41 mg/dL

## 2017-04-01 LAB — GLUCOSE, CAPILLARY: Glucose-Capillary: 102 mg/dL — ABNORMAL HIGH (ref 65–99)

## 2017-04-01 SURGERY — RESECTION, RECTUM, LOW ANTERIOR, ROBOT-ASSISTED
Anesthesia: General | Laterality: Right

## 2017-04-01 MED ORDER — CALCIUM CHLORIDE 10 % IV SOLN
INTRAVENOUS | Status: DC | PRN
  Administered 2017-04-01 (×10): 100 mg via INTRAVENOUS

## 2017-04-01 MED ORDER — NEOMYCIN SULFATE 500 MG PO TABS
1000.0000 mg | ORAL_TABLET | ORAL | Status: DC
  Filled 2017-04-01: qty 2

## 2017-04-01 MED ORDER — METHYLENE BLUE 0.5 % INJ SOLN
INTRAVENOUS | Status: AC
  Filled 2017-04-01: qty 10

## 2017-04-01 MED ORDER — EPINEPHRINE PF 1 MG/10ML IJ SOSY
PREFILLED_SYRINGE | INTRAMUSCULAR | Status: AC
  Filled 2017-04-01: qty 10

## 2017-04-01 MED ORDER — MIDAZOLAM HCL 2 MG/2ML IJ SOLN
INTRAMUSCULAR | Status: AC
  Filled 2017-04-01: qty 2

## 2017-04-01 MED ORDER — ONDANSETRON HCL 4 MG/2ML IJ SOLN
INTRAMUSCULAR | Status: AC
  Filled 2017-04-01: qty 2

## 2017-04-01 MED ORDER — FENTANYL CITRATE (PF) 100 MCG/2ML IJ SOLN
INTRAMUSCULAR | Status: AC
  Filled 2017-04-01: qty 2

## 2017-04-01 MED ORDER — ROCURONIUM BROMIDE 50 MG/5ML IV SOSY
PREFILLED_SYRINGE | INTRAVENOUS | Status: AC
  Filled 2017-04-01: qty 5

## 2017-04-01 MED ORDER — PHENYLEPHRINE HCL 10 MG/ML IJ SOLN
INTRAMUSCULAR | Status: AC
  Filled 2017-04-01: qty 1

## 2017-04-01 MED ORDER — CLINDAMYCIN PHOSPHATE 900 MG/50ML IV SOLN
INTRAVENOUS | Status: AC
  Filled 2017-04-01: qty 50

## 2017-04-01 MED ORDER — LIDOCAINE 2% (20 MG/ML) 5 ML SYRINGE
INTRAMUSCULAR | Status: DC | PRN
  Administered 2017-04-01: 1 mg/kg/h via INTRAVENOUS

## 2017-04-01 MED ORDER — LIDOCAINE HCL (CARDIAC) 20 MG/ML IV SOLN
INTRAVENOUS | Status: DC | PRN
  Administered 2017-04-01: 50 mg via INTRAVENOUS

## 2017-04-01 MED ORDER — EPHEDRINE SULFATE 50 MG/ML IJ SOLN
INTRAMUSCULAR | Status: DC | PRN
  Administered 2017-04-01: 5 mg via INTRAVENOUS
  Administered 2017-04-01: 10 mg via INTRAVENOUS

## 2017-04-01 MED ORDER — LIDOCAINE 2% (20 MG/ML) 5 ML SYRINGE
INTRAMUSCULAR | Status: AC
  Filled 2017-04-01: qty 15

## 2017-04-01 MED ORDER — ALBUMIN HUMAN 5 % IV SOLN
INTRAVENOUS | Status: AC
  Filled 2017-04-01: qty 250

## 2017-04-01 MED ORDER — FENTANYL CITRATE (PF) 250 MCG/5ML IJ SOLN
INTRAMUSCULAR | Status: AC
  Filled 2017-04-01: qty 5

## 2017-04-01 MED ORDER — PROPOFOL 10 MG/ML IV BOLUS
INTRAVENOUS | Status: DC | PRN
  Administered 2017-04-01: 140 mg via INTRAVENOUS

## 2017-04-01 MED ORDER — PROPOFOL 10 MG/ML IV BOLUS
INTRAVENOUS | Status: AC
  Filled 2017-04-01: qty 40

## 2017-04-01 MED ORDER — PHENYLEPHRINE HCL 10 MG/ML IJ SOLN
INTRAMUSCULAR | Status: DC | PRN
  Administered 2017-04-01: 40 ug via INTRAVENOUS
  Administered 2017-04-01: 80 ug via INTRAVENOUS
  Administered 2017-04-01: 40 ug via INTRAVENOUS
  Administered 2017-04-01 (×6): 80 ug via INTRAVENOUS
  Administered 2017-04-01 (×3): 40 ug via INTRAVENOUS
  Administered 2017-04-01 (×3): 80 ug via INTRAVENOUS

## 2017-04-01 MED ORDER — ATROPINE SULFATE 1 MG/10ML IJ SOSY
PREFILLED_SYRINGE | INTRAMUSCULAR | Status: AC
  Filled 2017-04-01: qty 10

## 2017-04-01 MED ORDER — PHENYLEPHRINE 40 MCG/ML (10ML) SYRINGE FOR IV PUSH (FOR BLOOD PRESSURE SUPPORT)
PREFILLED_SYRINGE | INTRAVENOUS | Status: AC
  Filled 2017-04-01: qty 10

## 2017-04-01 MED ORDER — IOHEXOL 300 MG/ML  SOLN
INTRAMUSCULAR | Status: DC | PRN
  Administered 2017-04-01: 24 mL

## 2017-04-01 MED ORDER — PHENYLEPHRINE HCL 10 MG/ML IJ SOLN
INTRAVENOUS | Status: DC | PRN
  Administered 2017-04-01: 25 ug/min via INTRAVENOUS

## 2017-04-01 MED ORDER — SODIUM CHLORIDE 0.9 % IV SOLN: Freq: Once | INTRAVENOUS | Status: DC

## 2017-04-01 MED ORDER — DEXAMETHASONE SODIUM PHOSPHATE 4 MG/ML IJ SOLN
INTRAMUSCULAR | Status: DC | PRN
  Administered 2017-04-01: 10 mg via INTRAVENOUS

## 2017-04-01 MED ORDER — CALCIUM CHLORIDE 10 % IV SOLN
INTRAVENOUS | Status: AC
  Filled 2017-04-01: qty 10

## 2017-04-01 MED ORDER — FENTANYL CITRATE (PF) 100 MCG/2ML IJ SOLN
INTRAMUSCULAR | Status: DC | PRN
  Administered 2017-04-01 (×11): 50 ug via INTRAVENOUS

## 2017-04-01 MED ORDER — ONDANSETRON HCL 4 MG/2ML IJ SOLN
INTRAMUSCULAR | Status: DC | PRN
  Administered 2017-04-01: 4 mg via INTRAVENOUS

## 2017-04-01 MED ORDER — LACTATED RINGERS IV SOLN
INTRAVENOUS | Status: DC | PRN
  Administered 2017-04-01 (×5): via INTRAVENOUS

## 2017-04-01 MED ORDER — SODIUM CHLORIDE 0.9 % IV SOLN
25.0000 ug/h | INTRAVENOUS | Status: DC
  Administered 2017-04-01: 50 ug/h via INTRAVENOUS
  Filled 2017-04-01: qty 50

## 2017-04-01 MED ORDER — LIDOCAINE 2% (20 MG/ML) 5 ML SYRINGE
INTRAMUSCULAR | Status: AC
  Filled 2017-04-01: qty 20

## 2017-04-01 MED ORDER — POLYETHYLENE GLYCOL 3350 17 GM/SCOOP PO POWD
1.0000 | Freq: Once | ORAL | Status: DC
  Filled 2017-04-01: qty 255

## 2017-04-01 MED ORDER — SCOPOLAMINE 1 MG/3DAYS TD PT72SCOPOLAMINE 1 MG/3DAYS
MEDICATED_PATCH | TRANSDERMAL | Status: DC | PRN
Start: 2017-04-01 — End: 2017-04-01
  Administered 2017-04-01: 1 via TRANSDERMAL

## 2017-04-01 MED ORDER — ALVIMOPAN 12 MG PO CAPS
12.0000 mg | ORAL_CAPSULE | Freq: Once | ORAL | Status: AC
  Administered 2017-04-01: 12 mg via ORAL
  Filled 2017-04-01: qty 1

## 2017-04-01 MED ORDER — SODIUM CHLORIDE 0.9 % IV SOLN
INTRAVENOUS | Status: DC | PRN
  Administered 2017-04-01 (×2): via INTRAVENOUS

## 2017-04-01 MED ORDER — BUPIVACAINE-EPINEPHRINE 0.25% -1:200000 IJ SOLN
INTRAMUSCULAR | Status: AC
  Filled 2017-04-01: qty 1

## 2017-04-01 MED ORDER — LIDOCAINE 2% (20 MG/ML) 5 ML SYRINGE
INTRAMUSCULAR | Status: AC
  Filled 2017-04-01: qty 5

## 2017-04-01 MED ORDER — LIDOCAINE 2% (20 MG/ML) 5 ML SYRINGE
INTRAMUSCULAR | Status: AC
  Filled 2017-04-01: qty 10

## 2017-04-01 MED ORDER — PANTOPRAZOLE SODIUM 40 MG IV SOLR
40.0000 mg | Freq: Every day | INTRAVENOUS | Status: DC
  Administered 2017-04-02: 40 mg via INTRAVENOUS
  Filled 2017-04-01 (×2): qty 40

## 2017-04-01 MED ORDER — DEXTROSE-NACL 5-0.9 % IV SOLN: INTRAVENOUS | Status: DC

## 2017-04-01 MED ORDER — PHENYLEPHRINE HCL-NACL 10-0.9 MG/250ML-% IV SOLN
0.0000 ug/min | INTRAVENOUS | Status: DC
  Administered 2017-04-02: 100 ug/min via INTRAVENOUS
  Administered 2017-04-02: 20 ug/min via INTRAVENOUS
  Filled 2017-04-01 (×5): qty 250

## 2017-04-01 MED ORDER — PROPOFOL 10 MG/ML IV BOLUS
INTRAVENOUS | Status: AC
  Filled 2017-04-01: qty 20

## 2017-04-01 MED ORDER — KETAMINE HCL 10 MG/ML IJ SOLN
INTRAMUSCULAR | Status: DC | PRN
  Administered 2017-04-01 (×9): 10 mg via INTRAVENOUS

## 2017-04-01 MED ORDER — PROPOFOL 1000 MG/100ML IV EMUL
0.0000 ug/kg/min | INTRAVENOUS | Status: DC
  Administered 2017-04-01: 25 ug/kg/min via INTRAVENOUS
  Administered 2017-04-02: 15 ug/kg/min via INTRAVENOUS
  Filled 2017-04-01: qty 100

## 2017-04-01 MED ORDER — SODIUM BICARBONATE 8.4 % IV SOLN
INTRAVENOUS | Status: DC | PRN
  Administered 2017-04-01: 25 meq via INTRAVENOUS

## 2017-04-01 MED ORDER — FENTANYL CITRATE (PF) 100 MCG/2ML IJ SOLN
50.0000 ug | Freq: Once | INTRAMUSCULAR | Status: AC
  Administered 2017-04-01: 50 ug via INTRAVENOUS

## 2017-04-01 MED ORDER — ENOXAPARIN SODIUM 40 MG/0.4ML ~~LOC~~ SOLN
40.0000 mg | Freq: Once | SUBCUTANEOUS | Status: AC
  Administered 2017-04-01: 40 mg via SUBCUTANEOUS
  Filled 2017-04-01: qty 0.4

## 2017-04-01 MED ORDER — ACETAMINOPHEN 500 MG PO TABS
1000.0000 mg | ORAL_TABLET | ORAL | Status: AC
  Administered 2017-04-01: 1000 mg via ORAL
  Filled 2017-04-01: qty 2

## 2017-04-01 MED ORDER — SCOPOLAMINE 1 MG/3DAYS TD PT72
MEDICATED_PATCH | TRANSDERMAL | Status: AC
  Filled 2017-04-01: qty 1

## 2017-04-01 MED ORDER — ALBUMIN HUMAN 5 % IV SOLN
INTRAVENOUS | Status: DC | PRN
  Administered 2017-04-01 (×2): via INTRAVENOUS

## 2017-04-01 MED ORDER — CLINDAMYCIN PHOSPHATE 900 MG/50ML IV SOLN
900.0000 mg | Freq: Once | INTRAVENOUS | Status: DC
  Filled 2017-04-01: qty 50

## 2017-04-01 MED ORDER — KETAMINE HCL 10 MG/ML IJ SOLN
INTRAMUSCULAR | Status: AC
  Filled 2017-04-01: qty 1

## 2017-04-01 MED ORDER — SODIUM CHLORIDE 0.9 % IV SOLN
INTRAVENOUS | Status: DC | PRN
  Administered 2017-04-01: 6 mL via INTRAPERITONEAL

## 2017-04-01 MED ORDER — BUPIVACAINE LIPOSOME 1.3 % IJ SUSP
20.0000 mL | Freq: Once | INTRAMUSCULAR | Status: AC
  Administered 2017-04-01: 20 mL
  Filled 2017-04-01: qty 20

## 2017-04-01 MED ORDER — PROPOFOL 500 MG/50ML IV EMUL
INTRAVENOUS | Status: DC | PRN
  Administered 2017-04-01: 25 ug/kg/min via INTRAVENOUS

## 2017-04-01 MED ORDER — DEXAMETHASONE SODIUM PHOSPHATE 10 MG/ML IJ SOLN
INTRAMUSCULAR | Status: AC
  Filled 2017-04-01: qty 1

## 2017-04-01 MED ORDER — MIDAZOLAM HCL 5 MG/5ML IJ SOLN
INTRAMUSCULAR | Status: DC | PRN
  Administered 2017-04-01: 2 mg via INTRAVENOUS
  Administered 2017-04-01 (×2): 1 mg via INTRAVENOUS

## 2017-04-01 MED ORDER — LACTATED RINGERS IR SOLN
Status: DC | PRN
  Administered 2017-04-01: 1000 mL

## 2017-04-01 MED ORDER — BUPIVACAINE-EPINEPHRINE 0.25% -1:200000 IJ SOLN
INTRAMUSCULAR | Status: AC
  Filled 2017-04-01: qty 2

## 2017-04-01 MED ORDER — FENTANYL CITRATE (PF) 250 MCG/5ML IJ SOLN
INTRAMUSCULAR | Status: DC | PRN
  Administered 2017-04-01 (×3): 50 ug via INTRAVENOUS
  Administered 2017-04-01: 100 ug via INTRAVENOUS

## 2017-04-01 MED ORDER — BISACODYL 5 MG PO TBEC
20.0000 mg | DELAYED_RELEASE_TABLET | Freq: Once | ORAL | Status: DC
  Filled 2017-04-01: qty 4

## 2017-04-01 MED ORDER — GABAPENTIN 300 MG PO CAPS
300.0000 mg | ORAL_CAPSULE | ORAL | Status: AC
  Administered 2017-04-01: 300 mg via ORAL
  Filled 2017-04-01: qty 1

## 2017-04-01 MED ORDER — SODIUM BICARBONATE 8.4 % IV SOLN
INTRAVENOUS | Status: AC
  Filled 2017-04-01: qty 50

## 2017-04-01 MED ORDER — SODIUM CHLORIDE 0.9 % IR SOLN
Status: DC | PRN
  Administered 2017-04-01: 2000 mL

## 2017-04-01 MED ORDER — BUPIVACAINE-EPINEPHRINE 0.25% -1:200000 IJ SOLN
INTRAMUSCULAR | Status: DC | PRN
  Administered 2017-04-01: 50 mL

## 2017-04-01 MED ORDER — ROCURONIUM BROMIDE 100 MG/10ML IV SOLN
INTRAVENOUS | Status: DC | PRN
  Administered 2017-04-01: 10 mg via INTRAVENOUS
  Administered 2017-04-01: 20 mg via INTRAVENOUS
  Administered 2017-04-01 (×4): 10 mg via INTRAVENOUS
  Administered 2017-04-01: 20 mg via INTRAVENOUS
  Administered 2017-04-01 (×3): 10 mg via INTRAVENOUS
  Administered 2017-04-01: 20 mg via INTRAVENOUS
  Administered 2017-04-01 (×2): 10 mg via INTRAVENOUS
  Administered 2017-04-01: 50 mg via INTRAVENOUS
  Administered 2017-04-01: 10 mg via INTRAVENOUS
  Administered 2017-04-01: 20 mg via INTRAVENOUS

## 2017-04-01 MED ORDER — FENTANYL BOLUS VIA INFUSION
50.0000 ug | INTRAVENOUS | Status: DC | PRN
  Administered 2017-04-02 (×2): 50 ug via INTRAVENOUS
  Filled 2017-04-01: qty 50

## 2017-04-01 MED ORDER — CELECOXIB 200 MG PO CAPS
400.0000 mg | ORAL_CAPSULE | ORAL | Status: AC
  Administered 2017-04-01: 400 mg via ORAL
  Filled 2017-04-01: qty 2

## 2017-04-01 MED ORDER — EPHEDRINE 5 MG/ML INJ
INTRAVENOUS | Status: AC
  Filled 2017-04-01: qty 30

## 2017-04-01 MED ORDER — METRONIDAZOLE 500 MG PO TABS
1000.0000 mg | ORAL_TABLET | ORAL | Status: DC
  Filled 2017-04-01: qty 2

## 2017-04-01 SURGICAL SUPPLY — 178 items
APPLICATOR COTTON TIP 6IN STRL (MISCELLANEOUS) IMPLANT
APPLICATOR SURGIFLO ENDO (HEMOSTASIS) IMPLANT
APPLIER CLIP 5 13 M/L LIGAMAX5 (MISCELLANEOUS)
APPLIER CLIP ROT 10 11.4 M/L (STAPLE)
BAG URO CATCHER STRL LF (MISCELLANEOUS) ×3 IMPLANT
BASKET LASER NITINOL 1.9FR (BASKET) IMPLANT
BLADE EXTENDED COATED 6.5IN (ELECTRODE) IMPLANT
BLADE SURG SZ10 CARB STEEL (BLADE) ×3 IMPLANT
CANNULA REDUC XI 12-8 STAPL (CANNULA) ×1
CANNULA REDUCER 12-8 DVNC XI (CANNULA) ×2 IMPLANT
CATH FOLEY 2WAY SLVR 30CC 20FR (CATHETERS) ×3 IMPLANT
CATH FOLEY 3WAY  5CC 18FR (CATHETERS)
CATH FOLEY 3WAY 5CC 18FR (CATHETERS) IMPLANT
CATH INTERMIT  6FR 70CM (CATHETERS) ×3 IMPLANT
CELLS DAT CNTRL 66122 CELL SVR (MISCELLANEOUS) IMPLANT
CHLORAPREP W/TINT 26ML (MISCELLANEOUS) ×3 IMPLANT
CLIP APPLIE 5 13 M/L LIGAMAX5 (MISCELLANEOUS) IMPLANT
CLIP APPLIE ROT 10 11.4 M/L (STAPLE) IMPLANT
CLIP LIGATING HEMO O LOK GREEN (MISCELLANEOUS) ×3 IMPLANT
CLIP VESOLOCK LG 6/CT PURPLE (CLIP) IMPLANT
CLIP VESOLOCK MED LG 6/CT (CLIP) ×3 IMPLANT
CLOTH BEACON ORANGE TIMEOUT ST (SAFETY) IMPLANT
COUNTER NEEDLE 1200 MAGNETIC (NEEDLE) ×3 IMPLANT
COUNTER NEEDLE 20 DBL MAG RED (NEEDLE) IMPLANT
COVER FOOTSWITCH UNIV (MISCELLANEOUS) IMPLANT
COVER MAYO STAND STRL (DRAPES) IMPLANT
COVER SURGICAL LIGHT HANDLE (MISCELLANEOUS) ×3 IMPLANT
COVER TIP SHEARS 8 DVNC (MISCELLANEOUS) ×4 IMPLANT
COVER TIP SHEARS 8MM DA VINCI (MISCELLANEOUS) ×2
DECANTER SPIKE VIAL GLASS SM (MISCELLANEOUS) ×6 IMPLANT
DEVICE TROCAR PUNCTURE CLOSURE (ENDOMECHANICALS) IMPLANT
DRAIN CHANNEL 15F RND FF 3/16 (WOUND CARE) ×3 IMPLANT
DRAIN CHANNEL 19F RND (DRAIN) ×3 IMPLANT
DRAPE ARM DVNC X/XI (DISPOSABLE) ×8 IMPLANT
DRAPE COLUMN DVNC XI (DISPOSABLE) ×2 IMPLANT
DRAPE DA VINCI XI ARM (DISPOSABLE) ×4
DRAPE DA VINCI XI COLUMN (DISPOSABLE) ×1
DRAPE SURG IRRIG POUCH 19X23 (DRAPES) ×3 IMPLANT
DRSG OPSITE POSTOP 4X10 (GAUZE/BANDAGES/DRESSINGS) IMPLANT
DRSG OPSITE POSTOP 4X6 (GAUZE/BANDAGES/DRESSINGS) ×3 IMPLANT
DRSG OPSITE POSTOP 4X8 (GAUZE/BANDAGES/DRESSINGS) IMPLANT
DRSG TEGADERM 2-3/8X2-3/4 SM (GAUZE/BANDAGES/DRESSINGS) ×12 IMPLANT
DRSG TEGADERM 4X4.75 (GAUZE/BANDAGES/DRESSINGS) ×3 IMPLANT
ELECT PENCIL ROCKER SW 15FT (MISCELLANEOUS) ×3 IMPLANT
ELECT REM PT RETURN 15FT ADLT (MISCELLANEOUS) ×3 IMPLANT
ENDOLOOP SUT PDS II  0 18 (SUTURE)
ENDOLOOP SUT PDS II 0 18 (SUTURE) IMPLANT
EVACUATOR SILICONE 100CC (DRAIN) ×3 IMPLANT
FIBER LASER FLEXIVA 1000 (UROLOGICAL SUPPLIES) IMPLANT
FIBER LASER FLEXIVA 365 (UROLOGICAL SUPPLIES) IMPLANT
FIBER LASER FLEXIVA 550 (UROLOGICAL SUPPLIES) IMPLANT
FIBER LASER TRAC TIP (UROLOGICAL SUPPLIES) IMPLANT
GAUZE SPONGE 2X2 8PLY STRL LF (GAUZE/BANDAGES/DRESSINGS) ×2 IMPLANT
GAUZE SPONGE 4X4 12PLY STRL (GAUZE/BANDAGES/DRESSINGS) IMPLANT
GLOVE BIO SURGEON STRL SZ 6.5 (GLOVE) ×3 IMPLANT
GLOVE BIOGEL M STRL SZ7.5 (GLOVE) ×9 IMPLANT
GLOVE ECLIPSE 8.0 STRL XLNG CF (GLOVE) ×12 IMPLANT
GLOVE INDICATOR 8.0 STRL GRN (GLOVE) ×12 IMPLANT
GOWN STRL REUS W/TWL LRG LVL3 (GOWN DISPOSABLE) ×6 IMPLANT
GOWN STRL REUS W/TWL XL LVL3 (GOWN DISPOSABLE) ×39 IMPLANT
GRASPER ENDOPATH ANVIL 10MM (MISCELLANEOUS) IMPLANT
GRASPER SUT TROCAR 14GX15 (MISCELLANEOUS) ×3 IMPLANT
GUIDEWIRE ANG ZIPWIRE 038X150 (WIRE) ×3 IMPLANT
GUIDEWIRE STR DUAL SENSOR (WIRE) ×3 IMPLANT
HOLDER FOLEY CATH W/STRAP (MISCELLANEOUS) ×3 IMPLANT
IRRIG SUCT STRYKERFLOW 2 WTIP (MISCELLANEOUS) ×3
IRRIGATION SUCT STRKRFLW 2 WTP (MISCELLANEOUS) ×2 IMPLANT
IV NS 1000ML (IV SOLUTION)
IV NS 1000ML BAXH (IV SOLUTION) IMPLANT
KIT PROCEDURE DA VINCI SI (MISCELLANEOUS)
KIT PROCEDURE DVNC SI (MISCELLANEOUS) IMPLANT
LEGGING LITHOTOMY PAIR STRL (DRAPES) ×3 IMPLANT
LOOP VESSEL MAXI BLUE (MISCELLANEOUS) ×3 IMPLANT
LUBRICANT JELLY K Y 4OZ (MISCELLANEOUS) ×3 IMPLANT
MANIFOLD NEPTUNE II (INSTRUMENTS) ×3 IMPLANT
NDL SAFETY ECLIPSE 18X1.5 (NEEDLE) IMPLANT
NEEDLE HYPO 18GX1.5 SHARP (NEEDLE)
NEEDLE INSUFFLATION 14GA 120MM (NEEDLE) ×3 IMPLANT
NEEDLE SPNL 22GX7 SPINOC (NEEDLE) ×3 IMPLANT
NS IRRIG 1000ML POUR BTL (IV SOLUTION) ×6 IMPLANT
PACK CARDIOVASCULAR III (CUSTOM PROCEDURE TRAY) ×3 IMPLANT
PACK COLON (CUSTOM PROCEDURE TRAY) ×3 IMPLANT
PACK CYSTO (CUSTOM PROCEDURE TRAY) ×3 IMPLANT
PACK ROBOT UROLOGY CUSTOM (CUSTOM PROCEDURE TRAY) IMPLANT
PAD POSITIONING PINK XL (MISCELLANEOUS) ×6 IMPLANT
PLUG CATH AND CAP STER (CATHETERS) IMPLANT
PORT ACCESS TROCAR AIRSEAL 12 (TROCAR) ×4 IMPLANT
PORT ACCESS TROCAR AIRSEAL 5M (TROCAR) ×2
PORT LAP GEL ALEXIS MED 5-9CM (MISCELLANEOUS) ×3 IMPLANT
POUCH SPECIMEN RETRIEVAL 10MM (ENDOMECHANICALS) IMPLANT
RTRCTR WOUND ALEXIS 18CM MED (MISCELLANEOUS)
SCISSORS LAP 5X35 DISP (ENDOMECHANICALS) ×3 IMPLANT
SEAL CANN UNIV 5-8 DVNC XI (MISCELLANEOUS) ×10 IMPLANT
SEAL XI 5MM-8MM UNIVERSAL (MISCELLANEOUS) ×5
SEALER VESSEL DA VINCI XI (MISCELLANEOUS) ×1
SEALER VESSEL EXT DVNC XI (MISCELLANEOUS) ×2 IMPLANT
SET TRI-LUMEN FLTR TB AIRSEAL (TUBING) ×3 IMPLANT
SHEATH ACCESS URETERAL 24CM (SHEATH) IMPLANT
SHEATH ACCESS URETERAL 38CM (SHEATH) IMPLANT
SHEATH ACCESS URETERAL 54CM (SHEATH) IMPLANT
SHEET LAVH (DRAPES) IMPLANT
SLEEVE ADV FIXATION 5X100MM (TROCAR) ×3 IMPLANT
SOLUTION ELECTROLUBE (MISCELLANEOUS) ×3 IMPLANT
SPONGE GAUZE 2X2 STER 10/PKG (GAUZE/BANDAGES/DRESSINGS) ×1
SPONGE LAP 18X18 X RAY DECT (DISPOSABLE) ×9 IMPLANT
SPONGE LAP 4X18 X RAY DECT (DISPOSABLE) ×3 IMPLANT
STAPLER 45 BLU RELOAD XI (STAPLE) IMPLANT
STAPLER 45 BLUE RELOAD XI (STAPLE)
STAPLER 45 GREEN RELOAD XI (STAPLE)
STAPLER 45 GRN RELOAD XI (STAPLE) IMPLANT
STAPLER 90 3.5 STAND SLIM (STAPLE) ×3
STAPLER 90 3.5 STD SLIM (STAPLE) ×2 IMPLANT
STAPLER CANNULA SEAL DVNC XI (STAPLE) ×2 IMPLANT
STAPLER CANNULA SEAL XI (STAPLE) ×1
STAPLER PROXIMATE 75MM BLUE (STAPLE) ×3 IMPLANT
STAPLER SHEATH (SHEATH) ×1
STAPLER SHEATH ENDOWRIST DVNC (SHEATH) ×2 IMPLANT
STENT CONTOUR 7FRX24X.038 (STENTS) IMPLANT
STENT URET 6FRX24 CONTOUR (STENTS) ×3 IMPLANT
SURGIFLO W/THROMBIN 8M KIT (HEMOSTASIS) IMPLANT
SUT DVC VLOC 180 2-0 12IN GS21 (SUTURE) ×6
SUT MNCRL 3 0 VIOLET RB1 (SUTURE) ×4 IMPLANT
SUT MNCRL AB 4-0 PS2 18 (SUTURE) ×3 IMPLANT
SUT MON AB 2-0 SH 27 (SUTURE)
SUT MON AB 2-0 SH27 (SUTURE) IMPLANT
SUT MONOCRYL 3 0 RB1 (SUTURE) ×2
SUT PDS AB 1 CT1 27 (SUTURE) ×9 IMPLANT
SUT PDS AB 1 CTX 36 (SUTURE) ×6 IMPLANT
SUT PDS AB 1 TP1 96 (SUTURE) IMPLANT
SUT PDS AB 2-0 CT2 27 (SUTURE) ×15 IMPLANT
SUT PDS AB 3-0 SH 27 (SUTURE) IMPLANT
SUT PDS AB 4-0 RB1 27 (SUTURE) IMPLANT
SUT PDS AB 4-0 SH 27 (SUTURE) IMPLANT
SUT PROLENE 0 CT 2 (SUTURE) ×3 IMPLANT
SUT PROLENE 2 0 KS (SUTURE) ×6 IMPLANT
SUT PROLENE 2 0 SH DA (SUTURE) ×3 IMPLANT
SUT SILK 0 (SUTURE)
SUT SILK 0 30XBRD TIE 6 (SUTURE) IMPLANT
SUT SILK 2 0 (SUTURE) ×1
SUT SILK 2 0 SH CR/8 (SUTURE) ×9 IMPLANT
SUT SILK 2-0 18XBRD TIE 12 (SUTURE) ×2 IMPLANT
SUT SILK 2-0 30XBRD TIE 12 (SUTURE) IMPLANT
SUT SILK 3 0 (SUTURE) ×1
SUT SILK 3 0 12 30 (SUTURE) IMPLANT
SUT SILK 3 0 SH CR/8 (SUTURE) ×3 IMPLANT
SUT SILK 3-0 18XBRD TIE 12 (SUTURE) ×2 IMPLANT
SUT V-LOC BARB 180 2/0GR6 GS22 (SUTURE)
SUT VIC AB 0 CT1 27 (SUTURE)
SUT VIC AB 0 CT1 27XBRD ANTBC (SUTURE) IMPLANT
SUT VIC AB 2-0 SH 18 (SUTURE) ×15 IMPLANT
SUT VIC AB 2-0 SH 27 (SUTURE) ×9
SUT VIC AB 2-0 SH 27X BRD (SUTURE) ×18 IMPLANT
SUT VIC AB 3-0 SH 18 (SUTURE) IMPLANT
SUT VIC AB 3-0 SH 27 (SUTURE) ×1
SUT VIC AB 3-0 SH 27X BRD (SUTURE) IMPLANT
SUT VIC AB 3-0 SH 27XBRD (SUTURE) ×2 IMPLANT
SUT VIC AB 4-0 RB1 27 (SUTURE)
SUT VIC AB 4-0 RB1 27XBRD (SUTURE) IMPLANT
SUT VIC AB 4-0 SH 18 (SUTURE) IMPLANT
SUT VICRYL 0 UR6 27IN ABS (SUTURE) ×9 IMPLANT
SUTURE DVC VL 180 2-0 12INGS21 (SUTURE) ×4 IMPLANT
SUTURE V-LC BRB 180 2/0GR6GS22 (SUTURE) IMPLANT
SYR 10ML LL (SYRINGE) ×3 IMPLANT
SYR CONTROL 10ML LL (SYRINGE) ×3 IMPLANT
SYS LAPSCP GELPORT 120MM (MISCELLANEOUS)
SYSTEM LAPSCP GELPORT 120MM (MISCELLANEOUS) IMPLANT
TAPE UMBILICAL COTTON 1/8X30 (MISCELLANEOUS) ×3 IMPLANT
TOWEL OR 17X26 10 PK STRL BLUE (TOWEL DISPOSABLE) IMPLANT
TOWEL OR NON WOVEN STRL DISP B (DISPOSABLE) ×3 IMPLANT
TRAY FOLEY W/METER SILVER 16FR (SET/KITS/TRAYS/PACK) ×3 IMPLANT
TROCAR ADV FIXATION 5X100MM (TROCAR) IMPLANT
TROCAR XCEL 12X100 BLDLESS (ENDOMECHANICALS) ×3 IMPLANT
TUBE FEEDING 8FR 16IN STR KANG (MISCELLANEOUS) IMPLANT
TUBING CONNECTING 10 (TUBING) ×9 IMPLANT
TUBING INSUFFLATION 10FT LAP (TUBING) ×3 IMPLANT
URINEMETER 200ML W/220 (MISCELLANEOUS) ×3 IMPLANT
WATER STERILE IRR 1000ML POUR (IV SOLUTION) ×3 IMPLANT
YANKAUER SUCT BULB TIP 10FT TU (MISCELLANEOUS) IMPLANT

## 2017-04-01 NOTE — Interval H&P Note (Signed)
History and Physical Interval Note:  04/01/2017 7:12 AM  [name]  has presented today for surgery, with the diagnosis of recurrent endometrial cancer status post low anterior resection and  partial exoneration  Desire for colostomy takedown, RIGHT URETERAL STRICTURE  The various methods of treatment have been discussed with the patient and family. After consideration of risks, benefits and other options for treatment, the patient has consented to  Procedure(s) with comments: XI ROBOTIC VS LAPAROSCOPIC COLOSTOMY TAKEDOWN WITH LYSIS OF ADHESIONS. ERAS PATHWAY (N/A) - ERAS PATHWAY RIDGE PROCTOSCOPY (N/A) XI ROBOTICALLY ASSISTED LAPAROSCOPIC URETERAL RE-IMPLANTATION (Right) CYSTOSCOPY WITH RETROGRADE PYELOGRAM, URETEROSCOPY AND STENT PLACEMENT (Right) as a surgical intervention .  The patient's history has been reviewed, patient examined, no change in status, stable for surgery.  I have reviewed the patient's chart and labs.  Questions were answered to the patient's satisfaction.    I have re-reviewed the the patient's records, history, medications, and allergies.  I have re-examined the patient.  I again discussed intraoperative plans and goals of post-operative recovery.  The patient agrees to proceed.  Imagene Cahn  07/16/1956 [id number]  Patient Care Team: Carollee Herter, Alferd Apa, DO as PCP - General (Family Medicine) Heath Lark, MD as Consulting Physician (Hematology and Oncology) Everitt Amber, MD as Consulting Physician (Obstetrics and Gynecology) Michael [name], MD as Consulting Physician (General Surgery) Alexis Frock, MD as Consulting Physician (Urology) Altheimer, Legrand Como, MD as Consulting Physician (Endocrinology) Katy Apo, MD as Consulting Physician (Ophthalmology) Stark Klein, MD as Consulting Physician (General Surgery) Irene Limbo, MD as Consulting Physician (Plastic Surgery)  Patient Active Problem List   Diagnosis Date Noted  . Right pelvic mass c/w recurrent  endometrial cancer s/p resection/partial vaginectomy/ LAR/colostomy 11/19/2014 11/19/2014    Priority: High  . Hot flashes related to aromatase inhibitor therapy 03/18/2017  . Ureteral stricture, right 09/10/2016  . Breast cancer, left ([place]) 12/11/2015  . Genetic testing 11/27/2015  . Cancer of corpus uteri, except isthmus ([place]) 11/13/2015  . Breast cancer of upper-inner quadrant of left female breast ([place])   . Dehydration 10/09/2015  . Acute bacterial bronchitis 06/04/2015  . Hydronephrosis, right 03/19/2015  . Pelvic pain in female 03/19/2015  . Rash, skin 03/06/2015  . Diarrhea 02/20/2015  . Fever 12/30/2014  . Sepsis ([place]) 12/30/2014  . UTI (lower urinary tract infection) 12/30/2014  . Diabetes mellitus type 2, controlled ([place]) 12/30/2014  . Chronic anemia 12/30/2014  . Urinary tract infectious disease   . Hypomagnesemia 12/11/2014  . Hypokalemia 12/11/2014  . Cellulitis of left abdominal wall near colostomy 12/03/2014  . Reactive thrombocytosis 11/29/2014  . Pancytopenia, acquired ([place]) 11/26/2014  . Acute respiratory failure with hypoxia ([place]) 11/20/2014  . Morbid obesity ([place]) 11/20/2014  . Endotracheally intubated   . Abdominal pain 09/09/2014  . Postmenopausal bleeding 09/05/2014  . History of ovarian & endometrial cancer 07/17/2012  . History of endometrial cancer 05/10/2012  . Malignant neoplasm of corpus uteri, except isthmus ([place]) 05/10/2012  . Malignant neoplasm of ovary ([place]) 05/10/2012  . Chronic neutropenia ([place]) 12/14/2011  . Hypothyroidism 12/14/2011  . DM type 2 (diabetes mellitus, type 2) ([place]) 12/14/2011  . Benign essential HTN 12/14/2011    Past Medical History:  Diagnosis Date  . Anemia in neoplastic disease   . Benign essential HTN   . 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 Cancer of corpus uteri, except isthmus [facility]) dx 10-15-2004 oncologist-- dr Denman [name] and dr Alvy Bimler     dx endometrial and ovarian cancer s/p  chemotheapy and surgery:  recurrent 11-19-2014 w/ radiation 01-29-2015 to 03-10-2015  . Chronic idiopathic neutropenia (HCC)    presumed related to chemotherapy March 2006--- followed by dr Alvy Bimler   . Complication of anesthesia    PONV  . Diabetic retinopathy, background ([place])   . DM type 2 (diabetes mellitus, type 2) ([place])    monitored by dr Legrand Como altheimer  . Dysuria   . GERD (gastroesophageal reflux disease)    takes omeprazole  . Hiatal hernia   . History of bronchitis   . History of gastric polyp    2014  duodenum  . History of radiation therapy    01-29-2015 to 03-10-2015  pelvis 50.4Gy  . Hypothyroidism    monitored by dr Legrand Como altheimer  . Mixed dyslipidemia   . Multiple thyroid nodules    Managed by Dr. Harlow Asa  . PONV (postoperative nausea and vomiting)    "scopolamine patch works for me"  . Radiation-induced dermatitis    contact dermatitis , radiation completed, rash only on ankles now.  . Right flank pain   . S/P colostomy ([place])   . Seasonal allergies   . Shoulder stiffness    bilateral   . Ureteral stricture, right UROLOGIT-  DR [facility]   CHRONIC--  TREATMENT URETERAL STENT  . Vitamin D deficiency   . Wears glasses     Past Surgical History:  Procedure Laterality Date  . APPENDECTOMY    . BREAST RECONSTRUCTION WITH PLACEMENT OF TISSUE EXPANDER AND FLEX HD (ACELLULAR HYDRATED DERMIS) Bilateral 12/11/2015   Procedure: BILATERAL BREAST RECONSTRUCTION WITH PLACEMENT OF TISSUE EXPANDERS;  Surgeon: Irene Limbo, MD;  Location: [place];  Service: Plastics;  Laterality: Bilateral;  . COLONOSCOPY WITH PROPOFOL N/A 08/21/2013   Procedure: COLONOSCOPY WITH PROPOFOL;  Surgeon: Cleotis Nipper, MD;  [facility];  Service: Endoscopy;  Laterality: N/A;  . COLOSTOMY TAKEDOWN N/A 12/04/2014   Procedure:  LAPROSCOPIC LYSIS OF ADHESIONS, SPLENIC MOBILIZATION, RELOCATION OF COLOSTOMY, DEBRIDEMENT INITIAL COLOSTOMY SITE;  Surgeon: Michael [name], MD;  [facility]: [facility];  Service: General;  Laterality: N/A;  . CYSTOSCOPY W/ RETROGRADES Right 11/21/2015   Procedure: CYSTOSCOPY WITH RETROGRADE PYELOGRAM;  Surgeon: Alexis Frock, MD;  [facility];  Service: Urology;  Laterality: Right;  . CYSTOSCOPY W/ URETERAL STENT PLACEMENT Right 11/21/2015   Procedure: CYSTOSCOPY WITH STENT REPLACEMENT;  Surgeon: Alexis Frock, MD;  [facility];  Service: Urology;  Laterality: Right;  . CYSTOSCOPY W/ URETERAL STENT PLACEMENT Right 03/10/2016   Procedure: CYSTOSCOPY WITH STENT REPLACEMENT;  Surgeon: Alexis Frock, MD;  Location[facility];  Service: Urology;  Laterality: Right;  . CYSTOSCOPY W/ URETERAL STENT PLACEMENT Right 06/30/2016   Procedure: CYSTOSCOPY WITH RETROGRADE PYELOGRAM/URETERAL STENT EXCHANGE;  Surgeon: Alexis Frock, MD;  Location: [facility];  Service: Urology;  Laterality: Right;  . CYSTOSCOPY WITH RETROGRADE PYELOGRAM, URETEROSCOPY AND STENT PLACEMENT Right 03/20/2015   Procedure: CYSTOSCOPY WITH RETROGRADE PYELOGRAM, URETEROSCOPY WITH BALLOON DILATION AND STENT PLACEMENT ON RIGHT;  Surgeon: Alexis Frock, MD;  Location[facility];  Service: Urology;  Laterality: Right;  . CYSTOSCOPY WITH RETROGRADE PYELOGRAM, URETEROSCOPY AND STENT PLACEMENT Right 05/02/2015   Procedure: CYSTOSCOPY WITH RIGHT RETROGRADE PYELOGRAM,  DIAGNOSTIC URETEROSCOPY AND STENT PULL ;  Surgeon: Alexis Frock, MD;  Location: [facility];  Service: Urology;  Laterality: Right;  . CYSTOSCOPY WITH RETROGRADE PYELOGRAM, URETEROSCOPY AND STENT PLACEMENT Right 09/05/2015   Procedure: CYSTOSCOPY WITH RETROGRADE PYELOGRAM,  AND STENT PLACEMENT;  Surgeon: Alexis Frock, MD;  [facility];  Service: Urology;  Laterality: Right;  . CYSTOSCOPY WITH STENT PLACEMENT Right  10/27/2016   Procedure: CYSTOSCOPY WITH STENT CHANGE and right retrograde pyelogram;  Surgeon: Alexis Frock, MD;  Location: [facility];  Service: Urology;  Laterality: Right;  . EUS N/A 10/02/2014   Procedure: LOWER ENDOSCOPIC ULTRASOUND (EUS);  Surgeon: Arta Silence, MD;  Location: [facility];  Service: Endoscopy;  Laterality: N/A;  . EXCISION SOFT TISSUE MASS RIGHT FOREMAN  12-08-2006  . EYE SURGERY  as child   pytosis of eyelids repair  . INCISION AND DRAINAGE OF WOUND Bilateral 12/26/2015   Procedure: DEBRIDEMENT OF BILATERAL MASTECTOMY FLAPS;  Surgeon: Irene Limbo, MD;  Location: [place];  Service: Plastics;  Laterality: Bilateral;  . LAPAROSCOPIC CHOLECYSTECTOMY  1990  . LIPOSUCTION WITH LIPOFILLING Bilateral 04/16/2016   Procedure: LIPOSUCTION WITH LIPOFILLING TO BILATERAL CHEST;  Surgeon: Irene Limbo, MD;  Location: [place];  Service: Plastics;  Laterality: Bilateral;  . MASTECTOMY Right 12/11/2015   PROPHYLACTIC   . MASTECTOMY COMPLETE / SIMPLE W/ SENTINEL NODE BIOPSY Left 12/11/2015   AXILLARY SENTINEL LYMPH NODE BIOPSY   . MASTECTOMY W/ SENTINEL NODE BIOPSY Bilateral 12/11/2015   Procedure: RIGHT PROPHYLACTIC MASTECTOMY, LEFT TOTAL MASTECTOMY WITH LEFT AXILLARY SENTINEL LYMPH NODE BIOPSY;  Surgeon: Stark Klein, MD;  Location: [place];  Service: General;  Laterality: Bilateral;  . OSTOMY N/A 11/19/2014   Procedure: OSTOMY;  Surgeon: Michael [name], MD;  [facility];  Service: General;  Laterality: N/A;  . REMOVAL OF BILATERAL TISSUE EXPANDERS WITH PLACEMENT OF BILATERAL BREAST IMPLANTS Bilateral 04/16/2016   Procedure: REMOVAL OF BILATERAL TISSUE EXPANDERS WITH PLACEMENT OF BILATERAL BREAST IMPLANTS;  Surgeon: Irene Limbo, MD;  Location: [place];  Service: Plastics;  Laterality: Bilateral;  . ROBOTIC ASSISTED LAP VAGINAL HYSTERECTOMY N/A 11/19/2014   Procedure: ROBOTIC LYSIS OF ADHESIONS, CONVERTED TO  LAPAROTOMY RADICAL UPPER VAGINECTOMY,LOW ANTERIOR BOWEL RESECTION, COLOSTOMY, BILATERAL URETERAL STENT PLACEMENT AND CYSTONOMY CLOSURE;  Surgeon: Everitt Amber, MD;  [facility];  Service: Gynecology;  Laterality: N/A;  . TISSUE EXPANDER FILLING Bilateral 12/26/2015   Procedure: EXPANSION OF BILATERAL CHEST TISSUE EXPANDERS (60 mL- Right; 75 mL- Left);  Surgeon: Irene Limbo, MD;  Location: [place];  Service: Plastics;  Laterality: Bilateral;  . TONSILLECTOMY    . TOTAL ABDOMINAL HYSTERECTOMY  March 2006   [facility]   and Bilateral Salpingoophorectomy/  staging for Ovarian cancer/  an    Social History   Social History  . Marital status: Married    Spouse name: N/A  . Number of children: 1  . Years of education: N/A   Occupational History  . retired Therapist, sports from [place] History Main Topics  . Smoking status: Never Smoker  . Smokeless tobacco: Never Used  . Alcohol use Yes     Comment: rare social  . Drug use: Unknown  . Sexual activity: Not Currently   Other Topics Concern  . Not on file   Social History Narrative   Exercise-- has not gotten back into it since cancer came back    Family History  Problem Relation Age of Onset  . Cancer Mother 50       stomach ca  . Hypertension Mother   . Cancer Father  76       prostate ca  . Diabetes Father   . Heart disease Father        CABG  . Breast cancer Maternal Aunt        dx in her 59s  . Lymphoma Paternal Aunt   . Brain cancer Paternal Grandfather   . Ovarian cancer Other   . Diabetes Sister   . Hypertension Brother y-10  . Heart disease Brother        CABG  . Diabetes Brother     Current Facility-Administered Medications  Medication Dose Route Frequency Provider Last Rate Last Dose  . clindamycin (CLEOCIN) 900 mg, gentamicin (GARAMYCIN) 240 mg in sodium chloride 0.9 % 1,000 mL for intraperitoneal lavage   Intraperitoneal To OR Michael [name], MD      . clindamycin (CLEOCIN) 900 MG/50ML IVPB            . clindamycin (CLEOCIN) IVPB 900 mg  900 mg Intravenous 30 min Pre-Op Berton Mount, RPH      . gentamicin (GARAMYCIN) 340 mg in dextrose 5 % 100 mL IVPB  340 mg Intravenous 30 min Pre-Op Zeigler, Dustin G, RPH      . scopolamine (TRANSDERM-SCOP) 1 MG/3DAYS              Allergies  Allergen Reactions  . Penicillins Swelling    Facial swelling Has patient had a PCN reaction causing immediate rash, facial/tongue/throat swelling, SOB or lightheadedness with hypotension: Yes Has patient had a PCN reaction causing severe rash involving mucus membranes or skin necrosis: Yes Has patient had a PCN reaction that required hospitalization No Has patient had a PCN reaction occurring within the last 10 years: No If all of the above answers are "NO", then may proceed with Cephalosporin use.   [name] Kitchen Ultram [Tramadol] Hives  . Adhesive [Tape]     blisters  . Cefaclor Rash    Ceclor  . Erythromycin     Gastritis, abd cramps  . Trimethoprim Rash  . Ciprofloxacin Other (See Comments)    Unknown On Dr notes   . Pectin Rash    Pectin ring for stoma  . Sulfa Antibiotics Rash    BP 115/60   Pulse 80   Temp 98.8 F (37.1 C) (Oral)   Resp 16   Ht 5' 2.5" (1.588 m)   Wt 96.2 kg (212 lb)   SpO2 100%   BMI 38.16 kg/m   Labs: Results for orders placed or performed during the hospital encounter of 04/01/17 (from the past 48 hour(s))  Glucose, capillary     Status: Abnormal   Collection Time: 04/01/17  5:54 AM  Result Value Ref Range   Glucose-Capillary 102 (H) 65 - 99 mg/dL    Imaging / Studies: No results found.   [name], M.D., F.A.C.S. Gastrointestinal and Minimally Invasive Surgery [facility] [address] [place], [place] [address] [phone] Main / Paging  04/01/2017 7:12 AM    Zoya Sprecher C.

## 2017-04-01 NOTE — Anesthesia Procedure Notes (Signed)
Procedure Name: Intubation Date/Time: 04/01/2017 7:47 AM Performed by: Claudia Desanctis Pre-anesthesia Checklist: Patient identified, Emergency Drugs available, Suction available and Patient being monitored Patient Re-evaluated:Patient Re-evaluated prior to induction Oxygen Delivery Method: Circle system utilized Preoxygenation: Pre-oxygenation with 100% oxygen Induction Type: IV induction Ventilation: Mask ventilation without difficulty Laryngoscope Size: 2 and Miller Grade View: Grade I Tube type: Oral Tube size: 7.5 mm Number of attempts: 1 Airway Equipment and Method: Stylet Placement Confirmation: ETT inserted through vocal cords under direct vision,  positive ETCO2 and breath sounds checked- equal and bilateral Secured at: 22 cm Tube secured with: Tape Dental Injury: Teeth and Oropharynx as per pre-operative assessment

## 2017-04-01 NOTE — Anesthesia Preprocedure Evaluation (Signed)
Anesthesia Evaluation  Patient identified by MRN, date of birth, ID band Patient awake    Reviewed: Allergy & Precautions, NPO status , Patient's Chart, lab work & pertinent test results  History of Anesthesia Complications (+) PONV  Airway Mallampati: II  TM Distance: >3 FB Neck ROM: Full    Dental no notable dental hx.    Pulmonary neg pulmonary ROS,    Pulmonary exam normal breath sounds clear to auscultation       Cardiovascular hypertension, Normal cardiovascular exam Rhythm:Regular Rate:Normal     Neuro/Psych negative neurological ROS  negative psych ROS   GI/Hepatic Neg liver ROS, GERD  Medicated,  Endo/Other  diabetesHypothyroidism   Renal/GU negative Renal ROS  negative genitourinary   Musculoskeletal negative musculoskeletal ROS (+)   Abdominal   Peds negative pediatric ROS (+)  Hematology negative hematology ROS (+)   Anesthesia Other Findings   Reproductive/Obstetrics negative OB ROS                             Anesthesia Physical Anesthesia Plan  ASA: III  Anesthesia Plan: General   Post-op Pain Management:    Induction: Intravenous  PONV Risk Score and Plan:   Airway Management Planned: Oral ETT  Additional Equipment:   Intra-op Plan:   Post-operative Plan: Extubation in OR  Informed Consent: I have reviewed the patients History and Physical, chart, labs and discussed the procedure including the risks, benefits and alternatives for the proposed anesthesia with the patient or authorized representative who has indicated his/her understanding and acceptance.   Dental advisory given  Plan Discussed with: CRNA and Surgeon  Anesthesia Plan Comments:         Anesthesia Quick Evaluation

## 2017-04-01 NOTE — Transfer of Care (Signed)
Immediate Anesthesia Transfer of Care Note  Patient: [name]  Procedure(s) Performed: Procedure(s) with comments: XI ROBOTIC VS LAPAROSCOPIC COLOSTOMY TAKEDOWN WITH LYSIS OF ADHESIONS. (N/A) - ERAS PATHWAY RIDGE PROCTOSCOPY (N/A) XI ROBOTICALLY ASSISTED LAPAROSCOPIC URETERAL RE-IMPLANTATION (Right) CYSTOSCOPY WITH RETROGRADE PYELOGRAM, URETEROSCOPY AND STENT PLACEMENT (Right)  Patient Location: PACU  Anesthesia Type:General  Level of Consciousness: Patient remains intubated per anesthesia plan  Airway & Oxygen Therapy: Patient remains intubated per anesthesia plan and Patient placed on Ventilator (see vital sign flow sheet for setting)  Post-op Assessment: Report given to RN and Post -op Vital signs reviewed and stable  Post vital signs: Reviewed and stable  Last Vitals:  Vitals:   04/01/17 0602 04/01/17 2201  BP: 115/60 121/77  Pulse: 80 84  Resp: 16 14  Temp: 37.1 C   SpO2: 100% 100%    Last Pain:  Vitals:   04/01/17 0602  TempSrc: Oral      Patients Stated Pain Goal: 4 (16/10/96 0454)  Complications: No apparent anesthesia complications

## 2017-04-01 NOTE — H&P (Signed)
[name] is an 61 y.o. female.    Chief Complaint: Pre-op Cysto, RIGHT ureteral stent exchange, RIGHT ureteral reconstruction  HPI:   1 - Bladder Injury - s/p cystotomy repair and bilateral JJ stent placement by GYN oncology team 11/2014 at time of pelvic exenteration / end colostomy for metastatic GYN cancer.  2 - Metastatic Endometrial Cancer - s/p repeat resection [phone] with colon and vaginal involvment but negative nodes / margins. Had adjuvant chemo-XRT which she is now done with. Follows with Dr. Alvy Bimler.    3 - Right Ureteral Stricture - New Rt hydro by CT 03/2015 to distal ureter on eval flank pain.    Recent Course:   03/2015 - Operative cysto / ureteroscopy c/w likley 2-3cm stricture (distal end about 3cm proximal to UO, balloon to 29F, 8x24 JJ stent placed)   04/2015 - Ureteroscopy patent ureter, JJ stent removed   08/2015 - CT Rt hydro to distal ureter (mildly increased) no flank pain, Cr 1.2 ==> Rt 5x24 polaris stent 09/2015   03/2016 - Rt 6x24 polaris (moderately encrusted); 06/2016 Rt 6x24 polaris (mild encrusted)   09/2016 Rt 6x24 polaris (mildly encrusted) CT images suggest healthy ureter above iliacs.     PMH sig for DM2, morbid obesity, breast cancer, ovarian cancer, endometrial cancer.    Today "[name]" is seen to proceed with cysto, right stent change, right ureteral reconstruction, colostomy take down in combined general surgery / urology procedure.    Past Medical History:  Diagnosis Date  . Anemia in neoplastic disease   . Benign essential HTN   . 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 Cancer of corpus uteri, except isthmus [facility]) dx 10-15-2004 oncologist-- dr Denman [name] and dr Alvy Bimler     dx endometrial and ovarian cancer s/p  chemotheapy and surgery:  recurrent 11-19-2014  w/ radiation 01-29-2015 to 03-10-2015  . Chronic idiopathic neutropenia (HCC)    presumed related to chemotherapy March 2006--- followed by dr Alvy Bimler   . Complication of anesthesia    PONV  . Diabetic retinopathy, background ([place])   . DM type 2 (diabetes mellitus, type 2) ([place])    monitored by dr Legrand Como altheimer  . Dysuria   . GERD (gastroesophageal reflux disease)    takes omeprazole  . Hiatal hernia   . History of bronchitis   . History of gastric polyp    2014  duodenum  . History of radiation therapy    01-29-2015 to 03-10-2015  pelvis 50.4Gy  . Hypothyroidism    monitored by dr Legrand Como altheimer  . Mixed dyslipidemia   . Multiple thyroid nodules    Managed by Dr. Harlow Asa  . PONV (postoperative nausea and vomiting)    "scopolamine patch works for me"  . Radiation-induced dermatitis    contact dermatitis , radiation completed, rash only on ankles now.  . Right flank pain   . S/P colostomy ([place])   . Seasonal allergies   . Shoulder stiffness    bilateral   . Ureteral stricture, right UROLOGIT-  DR [facility]   CHRONIC--  TREATMENT URETERAL STENT  . Vitamin D deficiency   . Wears glasses     Past Surgical History:  Procedure Laterality Date  . APPENDECTOMY    . BREAST RECONSTRUCTION WITH PLACEMENT OF TISSUE EXPANDER AND FLEX HD (ACELLULAR HYDRATED DERMIS) Bilateral 12/11/2015  Procedure: BILATERAL BREAST RECONSTRUCTION WITH PLACEMENT OF TISSUE EXPANDERS;  Surgeon: Irene Limbo, MD;  Location: [place];  Service: Plastics;  Laterality: Bilateral;  . COLONOSCOPY WITH PROPOFOL N/A 08/21/2013   Procedure: COLONOSCOPY WITH PROPOFOL;  Surgeon: Cleotis Nipper, MD;  [facility];  Service: Endoscopy;  Laterality: N/A;  . COLOSTOMY TAKEDOWN N/A 12/04/2014   Procedure: LAPROSCOPIC LYSIS OF ADHESIONS, SPLENIC MOBILIZATION, RELOCATION OF COLOSTOMY, DEBRIDEMENT INITIAL COLOSTOMY SITE;  Surgeon: Michael [name], MD;  Location: [facility];  Service: General;  Laterality: N/A;  .  CYSTOSCOPY W/ RETROGRADES Right 11/21/2015   Procedure: CYSTOSCOPY WITH RETROGRADE PYELOGRAM;  Surgeon: Alexis Frock, MD;  Location: [facility];  Service: Urology;  Laterality: Right;  . CYSTOSCOPY W/ URETERAL STENT PLACEMENT Right 11/21/2015   Procedure: CYSTOSCOPY WITH STENT REPLACEMENT;  Surgeon: Alexis Frock, MD;  [facility];  Service: Urology;  Laterality: Right;  . CYSTOSCOPY W/ URETERAL STENT PLACEMENT Right 03/10/2016   Procedure: CYSTOSCOPY WITH STENT REPLACEMENT;  Surgeon: Alexis Frock, MD;  Location[facility];  Service: Urology;  Laterality: Right;  . CYSTOSCOPY W/ URETERAL STENT PLACEMENT Right 06/30/2016   Procedure: CYSTOSCOPY WITH RETROGRADE PYELOGRAM/URETERAL STENT EXCHANGE;  Surgeon: Alexis Frock, MD;  Location: [facility];  Service: Urology;  Laterality: Right;  . CYSTOSCOPY WITH RETROGRADE PYELOGRAM, URETEROSCOPY AND STENT PLACEMENT Right 03/20/2015   Procedure: CYSTOSCOPY WITH RETROGRADE PYELOGRAM, URETEROSCOPY WITH BALLOON DILATION AND STENT PLACEMENT ON RIGHT;  Surgeon: Alexis Frock, MD;  Location: [facility];  Service: Urology;  Laterality: Right;  . CYSTOSCOPY WITH RETROGRADE PYELOGRAM, URETEROSCOPY AND STENT PLACEMENT Right 05/02/2015   Procedure: CYSTOSCOPY WITH RIGHT RETROGRADE PYELOGRAM,  DIAGNOSTIC URETEROSCOPY AND STENT PULL ;  Surgeon: Alexis Frock, MD;  Location: [facility];  Service: Urology;  Laterality: Right;  . CYSTOSCOPY WITH RETROGRADE PYELOGRAM, URETEROSCOPY AND STENT PLACEMENT Right 09/05/2015   Procedure: CYSTOSCOPY WITH RETROGRADE PYELOGRAM,  AND STENT PLACEMENT;  Surgeon: Alexis Frock, MD;  [facility];  Service: Urology;  Laterality: Right;  . CYSTOSCOPY WITH STENT PLACEMENT Right 10/27/2016   Procedure: CYSTOSCOPY WITH STENT CHANGE and right retrograde pyelogram;  Surgeon: Alexis Frock, MD;  Location: [facility];  Service: Urology;  Laterality: Right;  . EUS  N/A 10/02/2014   Procedure: LOWER ENDOSCOPIC ULTRASOUND (EUS);  Surgeon: Arta Silence, MD;  Location: [facility];  Service: Endoscopy;  Laterality: N/A;  . EXCISION SOFT TISSUE MASS RIGHT FOREMAN  12-08-2006  . EYE SURGERY  as child   pytosis of eyelids repair  . INCISION AND DRAINAGE OF WOUND Bilateral 12/26/2015   Procedure: DEBRIDEMENT OF BILATERAL MASTECTOMY FLAPS;  Surgeon: Irene Limbo, MD;  Location: [place];  Service: Plastics;  Laterality: Bilateral;  . LAPAROSCOPIC CHOLECYSTECTOMY  1990  . LIPOSUCTION WITH LIPOFILLING Bilateral 04/16/2016   Procedure: LIPOSUCTION WITH LIPOFILLING TO BILATERAL CHEST;  Surgeon: Irene Limbo, MD;  Location: [place];  Service: Plastics;  Laterality: Bilateral;  . MASTECTOMY Right 12/11/2015   PROPHYLACTIC   . MASTECTOMY COMPLETE / SIMPLE W/ SENTINEL NODE BIOPSY Left 12/11/2015   AXILLARY SENTINEL LYMPH NODE BIOPSY   . MASTECTOMY W/ SENTINEL NODE BIOPSY Bilateral 12/11/2015   Procedure: RIGHT PROPHYLACTIC MASTECTOMY, LEFT TOTAL MASTECTOMY WITH LEFT AXILLARY SENTINEL LYMPH NODE BIOPSY;  Surgeon: Stark Klein, MD;  Location: [place];  Service: General;  Laterality: Bilateral;  . OSTOMY N/A 11/19/2014   Procedure: OSTOMY;  Surgeon: Michael [name], MD;  [facility];  Service: General;  Laterality: N/A;  .  REMOVAL OF BILATERAL TISSUE EXPANDERS WITH PLACEMENT OF BILATERAL BREAST IMPLANTS Bilateral 04/16/2016   Procedure: REMOVAL OF BILATERAL TISSUE EXPANDERS WITH PLACEMENT OF BILATERAL BREAST IMPLANTS;  Surgeon: Irene Limbo, MD;  Location: [place];  Service: Plastics;  Laterality: Bilateral;  . ROBOTIC ASSISTED LAP VAGINAL HYSTERECTOMY N/A 11/19/2014   Procedure: ROBOTIC LYSIS OF ADHESIONS, CONVERTED TO LAPAROTOMY RADICAL UPPER VAGINECTOMY,LOW ANTERIOR BOWEL RESECTION, COLOSTOMY, BILATERAL URETERAL STENT PLACEMENT AND CYSTONOMY CLOSURE;  Surgeon: Everitt Amber, MD;  Location[facility];  Service: Gynecology;   Laterality: N/A;  . TISSUE EXPANDER FILLING Bilateral 12/26/2015   Procedure: EXPANSION OF BILATERAL CHEST TISSUE EXPANDERS (60 mL- Right; 75 mL- Left);  Surgeon: Irene Limbo, MD;  Location: [place];  Service: Plastics;  Laterality: Bilateral;  . TONSILLECTOMY    . TOTAL ABDOMINAL HYSTERECTOMY  March 2006   [facility]   and Bilateral Salpingoophorectomy/  staging for Ovarian cancer/  an    Family History  Problem Relation Age of Onset  . Cancer Mother 44       stomach ca  . Hypertension Mother   . Cancer Father 96       prostate ca  . Diabetes Father   . Heart disease Father        CABG  . Breast cancer Maternal Aunt        dx in her 62s  . Lymphoma Paternal Aunt   . Brain cancer Paternal Grandfather   . Ovarian cancer Other   . Diabetes Sister   . Hypertension Brother y-10  . Heart disease Brother        CABG  . Diabetes Brother    Social History:  reports that she has never smoked. She has never used smokeless tobacco. She reports that she drinks alcohol. Her drug history is not on file.  Allergies:  Allergies  Allergen Reactions  . Penicillins Swelling    Facial swelling Has patient had a PCN reaction causing immediate rash, facial/tongue/throat swelling, SOB or lightheadedness with hypotension: Yes Has patient had a PCN reaction causing severe rash involving mucus membranes or skin necrosis: Yes Has patient had a PCN reaction that required hospitalization No Has patient had a PCN reaction occurring within the last 10 years: No If all of the above answers are "NO", then may proceed with Cephalosporin use.   [name] Kitchen Ultram [Tramadol] Hives  . Adhesive [Tape]     blisters  . Cefaclor Rash    Ceclor  . Erythromycin     Gastritis, abd cramps  . Trimethoprim Rash  . Ciprofloxacin Other (See Comments)    Unknown On Dr notes   . Pectin Rash    Pectin ring for stoma  . Sulfa Antibiotics Rash    Medications Prior to Admission  Medication Sig Dispense  Refill  . anastrozole (ARIMIDEX) 1 MG tablet TAKE 1 TABLET DAILY 90 tablet 3  . diphenhydrAMINE (BENADRYL) 25 MG tablet Take 25 mg by mouth at bedtime as needed for itching or sleep. Reported on 09/15/2015    . filgrastim (NEUPOGEN) 480 MCG/1.6ML injection Inject 1.6 ml under the skin every 5 days for life (Patient taking differently: Inject 480 mcg into the skin See admin instructions. Inject 1.6 ml under the skin every 6 days for life) 16 mL 11  . levothyroxine (SYNTHROID, LEVOTHROID) 150 MCG tablet Take 150 mcg by mouth daily.    [name] Kitchen loratadine (CLARITIN) 10 MG tablet Take 10 mg by mouth daily.     . metFORMIN (GLUCOPHAGE) 1000  MG tablet Take 1,000 mg by mouth 2 (two) times daily with a meal.     . Polyethyl Glycol-Propyl Glycol (SYSTANE OP) Place 1 drop into both eyes at bedtime.     . rosuvastatin (CRESTOR) 10 MG tablet Take 10 mg by mouth every evening.     . sitaGLIPtin (JANUVIA) 100 MG tablet Take 100 mg by mouth every morning.     [name] Kitchen aspirin EC 81 MG tablet Take 81 mg by mouth daily.    . Biotin 5 MG TABS Take 5 mg by mouth every morning.     . Calcium-Magnesium-Vitamin D 400-166.7-133.3 MG-MG-UNIT TABS Take 1 tablet by mouth daily.     . Cholecalciferol (VITAMIN D3) 10000 UNITS capsule Take 10,000 Units by mouth once a week. Sunday evening's    . losartan (COZAAR) 100 MG tablet Take 1 tablet (100 mg total) by mouth daily. 90 tablet 1  . omega-3 acid ethyl esters (LOVAZA) 1 G capsule Take 1 g by mouth 2 (two) times daily.    . Prenatal Vit-Fe Fumarate-FA (PRENATAL VITAMIN PO) Take 1 capsule by mouth daily. Takes prenatal because there are no dyes in it      Results for orders placed or performed during the hospital encounter of 04/01/17 (from the past 48 hour(s))  Glucose, capillary     Status: Abnormal   Collection Time: 04/01/17  5:54 AM  Result Value Ref Range   Glucose-Capillary 102 (H) 65 - 99 mg/dL   No results found.  Review of Systems  Constitutional: Negative.  Negative for  chills and fever.  HENT: Negative.   Eyes: Negative.   Respiratory: Negative.   Cardiovascular: Negative.   Gastrointestinal: Negative.   Genitourinary: Positive for urgency. Negative for flank pain.  Musculoskeletal: Negative.   Skin: Negative.   Endo/Heme/Allergies: Negative.   Psychiatric/Behavioral: Negative.     Blood pressure 115/60, pulse 80, temperature 98.8 F (37.1 C), temperature source Oral, resp. rate 16, SpO2 100 %. Physical Exam  Constitutional: She appears well-developed.  HENT:  Head: Normocephalic.  Eyes: Pupils are equal, round, and reactive to light.  Neck: Normal range of motion.  Cardiovascular: Normal rate.   Respiratory: Effort normal.  GI: Soft.  Stable truncal obesity. Multiple scars w/o hernias.   Genitourinary:  Genitourinary Comments: No CVAT at present.   Musculoskeletal: Normal range of motion.  Neurological: She is alert.  Skin: Skin is warm.  Psychiatric: She has a normal mood and affect.     Assessment/Plan  Proceed as planned with major reconstructive urology / general surgery. Initial plan will be high reimplant with possible bladder flap / hitch v. Bowel interposition with goal to reimplant into healthy ureter, which is likely at level of iliacs given prior imaging, clip distribution from piror surgery, and known pelvic radiation. Robotic v. Open approach pending GI/GI anatomy and adhesion status. Risks, benefits, alternatives (continued chronic stents) discussed previously over years of care and reiterated today.   Alexis Frock, MD 04/01/2017, 6:06 AM

## 2017-04-01 NOTE — Anesthesia Procedure Notes (Signed)
Anesthesia Procedure Image    

## 2017-04-01 NOTE — Progress Notes (Signed)
eLink Physician-Brief Progress Note Patient Name: [name] DOB: 11/08/1955 MRN: [id number]   Date of Service  04/01/2017  HPI/Events of Note  Long surgery 12h -details noted EBL [phone], received 500 albumin, 2U PRBC & about 11 L crystalloids, UO 670cc  T 93  eICU Interventions  Vent/ sedation orders  -  Propofol / fent gtt ABG, BMET, pCXR for ETT/ CVL position Warmer Maintenance fluids      Intervention Category Evaluation Type: New Patient Evaluation  [name] V. 04/01/2017, 10:38 PM

## 2017-04-01 NOTE — Anesthesia Procedure Notes (Signed)
Central Venous Catheter Insertion Performed by: Myrtie Soman, anesthesiologist Start/End8/31/2018 8:26 AM, 04/01/2017 8:26 AM Patient location: Pre-op. Preanesthetic checklist: patient identified, IV checked, site marked, risks and benefits discussed, surgical consent, monitors and equipment checked, pre-op evaluation, timeout performed and anesthesia consent Position: Trendelenburg Lidocaine 1% used for infiltration and patient sedated Hand hygiene performed , maximum sterile barriers used  and Seldinger technique used Catheter size: 8 Fr Total catheter length 16. Central line was placed.Double lumen Procedure performed using ultrasound guided technique. Ultrasound Notes:anatomy identified, needle tip was noted to be adjacent to the nerve/plexus identified, no ultrasound evidence of intravascular and/or intraneural injection and image(s) printed for medical record Attempts: 1 Following insertion, dressing applied, line sutured and Biopatch. Post procedure assessment: blood return through all ports  Patient tolerated the procedure well with no immediate complications.

## 2017-04-01 NOTE — Anesthesia Procedure Notes (Signed)
Arterial Line Insertion Start/End8/31/2018 8:29 AM, 04/01/2017 8:34 AM Performed by: Victoriano Lain, CRNA  Patient location: OR. Preanesthetic checklist: patient identified, IV checked, site marked, risks and benefits discussed, surgical consent, monitors and equipment checked, pre-op evaluation, timeout performed and anesthesia consent Patient sedated Right, radial was placed Catheter size: 20 G Hand hygiene performed  and maximum sterile barriers used  Allen's test indicative of satisfactory collateral circulation Attempts: 2 Procedure performed without using ultrasound guided technique. Following insertion, dressing applied and Biopatch. Post procedure assessment: unchanged  Patient tolerated the procedure well with no immediate complications.

## 2017-04-01 NOTE — Op Note (Signed)
9:15 PM  PATIENT:  [name]  61 y.o. female  Patient Care Team: Carollee Herter, Alferd Apa, DO as PCP - General (Family Medicine) Heath Lark, MD as Consulting Physician (Hematology and Oncology) Everitt Amber, MD as Consulting Physician (Obstetrics and Gynecology) Michael [name], MD as Consulting Physician (General Surgery) Alexis Frock, MD as Consulting Physician (Urology) Altheimer, Legrand Como, MD as Consulting Physician (Endocrinology) Katy Apo, MD as Consulting Physician (Ophthalmology) Stark Klein, MD as Consulting Physician (General Surgery) Irene Limbo, MD as Consulting Physician (Plastic Surgery)  PRE-OPERATIVE DIAGNOSIS:    Recurrent endometrial cancer status post low anterior resection and partial pelvic exeneration   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  POST-OPERATIVE DIAGNOSIS:    Recurrent endometrial cancer status post low anterior resection and partial pelvic exeneration   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  PROCEDURE:   XI ROBOTIC LYSIS OF ADHESIONS x 5 hours XI ROBOTIC COLOSTOMY TAKEDOWN ROBOTIC SEWN COLOANAL ANASTOMOSIS Ileocecal resection with anastomosis Diverting loop ileostomy  SURGEON:  [name], MD  ASSISTANT: Phebe Colla  ANESTHESIA:   General and Local anesthetic as a field block}  EBL:  Total I/O In: 300 [I.V.:300] Out: 370 [Urine:70; Blood:300] "see anesthesia record"  Delay start of Pharmacological VTE agent (>24hrs) due to surgical blood loss or risk of bleeding:  yes  DRAINS: 67 Fr Blake drain in the pelvis   SPECIMEN:  Source of Specimen:  Ileum & ileocecal resections  DISPOSITION OF SPECIMEN:  PATHOLOGY  COUNTS:  YES  PLAN OF CARE: Admit to inpatient   PATIENT DISPOSITION:  ICU - intubated and hemodynamically stable.  INDICATION:    Patient status post posterior exoneration for large endometrial cancer recurrence.  Required right ureteral reimplantation as well as low  anterior resection and colostomy.  She is more than two years out disease free.  She was colostomy takedown.  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  Risks discussed in detail.  Risks of possibly of not able to successfully do colostomy takedown or ureteral reconstruction was discussed.  She wished to be aggressive and proceed.    The anatomy & physiology of the digestive tract was discussed.  The pathophysiology was discussed.  Natural history risks without surgery was discussed.   I worked to give an overview of the disease and the frequent need to have multispecialty involvement.  I feel the risks of no intervention will lead to serious problems that outweigh the operative risks; therefore, I recommended attempt at lysis adhesions and colostomy takedown.  Dr. Tresa Moore offered management of the right ureter with possible so as hitch and resection reimplantation.  Robotic, laparoscopic & open techniques were discussed.   Risks such as bleeding, infection, abscess, leak, reoperation, possible ostomy, hernia, heart attack, death, and other risks were discussed.  I noted a good likelihood this will help address the problem.   Goals of post-operative recovery were discussed as well.  We will work to minimize complications.  Educational materials on the pathology had been given in the office.  Questions were answered.    The patient expressed understanding & wished to proceed with surgery.  OR FINDINGS:   Patient had very dense adhesions of small intestine omentum to anterior bowel wall.  Extremely dense adhesions of ileum and cecum to pelvis.  No obvious metastatic disease on visceral parietal peritoneum or liver.  The anastomosis rests 2 cm from the anal verge.  It is sewn with 2-0  Vicryl.  No stapling done.  The patient has diverting loop ileostomy in the right upper quadrant.  The proximal loop is superior.  Distal loop is  inferior and flat  Patient had bladder takedown with psoas hitch.  Distal ureteral resection and reimplantation.  I assisted Dr. Tresa Moore.  See his notes for further distant details  PROCEDURE:  Informed consent was confirmed.  The patient underwent general anaesthesia without difficulty.  The patient was positioned appropriately.  VTE prevention in place.  Examination under anesthesia confirmed normal sphincter tone with an intact ring.  2 cm cuff.  Thicken rectum and fibrotic.  Vagina contracted and thickened.  Moderate cystocele.  Colostomy was sutured closed with a pursestring Prolene suture.The patient's abdomen was clipped, prepped, & draped in a sterile fashion.  Surgical timeout confirmed our plan.    The patient was positioned in reverse Trendelenburg.  Abdominal entry was gained using optical entry technique in the  right upper abdomen.  Entry was clean.  I induced carbon dioxide insufflation.  Camera inspection revealed no injury.  Extra ports were carefully placed under direct laparoscopic visualization.  I did some laparoscopic lysis of adhesions in order to have  ports placed.  The patient was carefully positioned.  The Intuitive daVinci robot was carefully docked with camera & instruments carefully placed.  The patient had no evidence of visceral or parietal peritoneal metastatic disease.  No evidence of metastasis.   We decided to proceed with surgery.  Procedure do robotic lysis adhesions to free all adhesions to the anterior abdominal wall.  There are were many adhesions involving most of the anterior abdominal wall.  Several dense loops of small bowel.  One going into a small periumbilical incisional hernia.  That was reduced.  I focused on less adhesions down the pelvis.  This took several hours.  Then she was able to free off numerous loops of small bowel.  Freed off interloop adhesions to help straighten out the small intestine.  Came down to some very dense adhesions with poor tissue  quality in the ileocecal region.  Had to go into the ileum at a few locations to avoid damaging ureters or retroperitoneum.  Eventually was able to mobilize all the small bowel and colon out of the pelvis.  I did lyse adhesions.  There was not much retroperitoneal adhesions on the left side though.  Freed some adhesions of small bowel off the colostomy.  Colon was already rather mobile.  I freed some greater omentum off the splenic flexure of the colon.  That already been taken down the last case.  Then began to dissect in the pelvis to follow the bladder and vaginal cuff.  It was rather thickened and fibrotic.  Hard to define the short fibrotic rectal stump.  Robot was undocked.  Took down the end colostomy through a circular incision around the colostomy.  Came through the abdominal wall and a mild peristomal hernia.  Very thickened abdominal wall.  Able to  eviscerate much of the left colon.   Placed a wound protector.   I clamped the colon 1cm proximal to the end colostomy using a reusable pursestringer device.  Passed a 2-0 Keith needle. I transected the colostomy off with scissors. I got healthy bleeding mucosa.  We sent the rectosigmoid colon specimen off to go to pathology.  We sized the colon orifice.  I chose a 33 EEA anvil stapler system.  I reinforced the prolene pursestring with interrupted silk suture.  Pursestring broke.  It was redone.  I placed the anvil to the open end of the proximal remaining colon and closed around it using the pursestring.    We did copious irrigation with crystalloid solution.  Hemostasis was good.  The distal end of the remaining colon easily reached down to the rectal stump, therefore, more colon mobilization was not needed.   We did not eviscerate the small intestine.  As expected with the extremely dense adhesions there were areas that required source oral perineal ileum.  Came to the distal ileum.  And up having to transect about 10 cm of ileum that had areas of  enterotomy that I needed to do down the pelvis.  Stapled off both ends.  I did have a short cuff of terminal ileum so I left that intact in the hopes of doing an ileo-ileal anastomosis.  I then turned the case over to Dr. Tresa Moore.  He proceeded to take down the bladder and identified mobilize the ureter on the right side.  Please see his note for detail.  I then scrubbed back in.  He was sided that it would be best to do the colostomy takedown first since it will be hard to elevate the bladder after the psoas hitch.  I was able to mobilize the small intestine off the left retroperitoneum in the anvil to reach down.  Proceeded to finger dilated and used the EEA sizers to dilate the rectal stump.  Plan stapler.  Brought the spike out.  However appear the could come through the tissues it was so thick.  Try to reposition but again not appropriate.  I then re-docked the robot and did more lyse adhesions tell free the rectovaginal septum off the anterior pelvis.  Mobilized the rectal stump posteriorly as well.  Because of the dense adhesions and poor tissues and did have to do bladder repair with 2-0 Vicryl interrupted sutures 2.  Posterior vaginal repair with 2-0 Vicryl 2 as well. I could see the rectal stump better.  Reattempted the EEA sizers but again the spike cannot come out well. We were able to dilate and open up the rectal stump nowith an pass a finger up through it.  It was just extremely fibrotic.  I therefore decided to do a sewn anastomosis robotically from the intraperitoneal aspect.  I removed the pursestring device and the anvil.  Brought the open end of the colon down towards the pelvis   I used 2-0 Vicryl interrupted sutures to create a colon end to end rectal stump anastomosis in a hexagonal past fashion.  I sewed the posterior rectal wall stump and then into the posterior open colon wall.  I tied the sutures down.  Then did similar sutures on the sides and then anteriorly.  Her very distal pelvis  was extremely narrow and fibrotic consistent with prior surgery and radiation.  Check anastomosis.  It was narrow but allowed my finger to pass and intact.  We did copious irrigation.  Tissues had been quite oozy and friable.  Hemostasis was good.  I returned the case to Dr. Bess Harvest.  I assisted him on the bladder hitch to the so as muscle.  Ureteral segmental resection and ureteral & implantation over a stent.  Please see his note for details.  He confirmed ureteral orifices at the base of the bladder were unharmed.  He confirmed the prior bladder repair done on the very distal dome was intact.  We then did copious irrigation.  Hemostasis good.  We eviscerated  the small bowel and ran it again.  The terminal ileal stump looked very ischemic and I did not feel was viable.  Therefore I decided to doshort ileocecal resection.  Did a side distal ileum to side proximal ascending colon anastomosis.  75 GIA stapler.  Stapled off the common defect with a TX 90.  Transected mirror incision Terry with clamps and silk ties.  Close the mesenteric defect with interrupted silk suture. Watch stitch done.  Remaining small bowel and colon was healthy and viable.  No evidence of any injury or ischemia or other abnormality.  Area of ileum about 20 cm proximal to this anastomosis chosen as probable loop ileostomy.  We did reinspection with segmental copious irrigation.  Hemostasis was good.  Did a final irrigation of clindamycin/gentamicin antibiotic solution.  Placed a drain down in the pelvis and over the ureteral reimplantation.  Redundant skin with Prolene suture.  Closed the right flank15 mm air seal port site with 0 Vicryl interrupted suture using a laparoscopic suture passer.  The rest the ports were five or 8 mm the left alone.  I chose area of ileum that would come up as a loop.  Clamps that through the wound protector and left upper quadrant.  I created a circular defect in the skin and subcutaneous tissues and excised  cylinder of subcutaneous fat and the right upper quadrant at the premarked loop ileostomies ostomy site.  This is been a site of one of the robotic ports.  Came across the anterior fascia transversely.  The rectus and posterior rectus fascia vertically.  Brought up the loop of small bowel.  We did came rate reinspection to confirm that the proximal end was superior at 12:00.  Distal end was inferior at 6:00.  Was able to eviscerate 7 cm out the skin even with her very thick abdominal wall.  We changed gowns and gloves.  Patient redraped per the colon SSI protocol.  We closed the skin port sites with 4 Monocryl and sterile dressing.   Closed the old colostomy wound after copious irrigation and antibiotic solution.  Closed fascia transversely with #1 PDS in a running fashion.  Closed subcutaneous tissues with interrupted 2-0 Vicryl suture.  Excised some skin medially and laterally to get rid of the dog ears and closed the skin with 4-0 Monocryl running suture.  Both corners left slightly open so antibiotic-soaked wicks could go into the corners.  Sterile dressing applied.  Drain secured already with 2-0 Prolene suture and sterile dressing applied.  Next I matured the loop ileostomy with 2-0 Vicryl interrupted sutures in a modified Brooke fashion.  Proximal end of the loop superior/12 o'clock prolapsing out 5 cm.  Distal loop flush with the skin.  Sterile appliance placed.  Because of the very prolonged case, anesthesia wished to have the patient remained intubated overnight to wake up and warm up gradually.  She will remain intubated.  Discussed with Dr. Dagmar Hait with critical care.  His team will help manage the patient.  I discussed the intraoperative findings and care to the patient's husband and family.  Questions were answered.  Discussed need for ileostomy care.  Possible need for IV fluid infusions and antidiarrheals.  Possible concern of stricturing or other issues with anastomosis that may fail, requiring  current loop ileostomy to be permanent.  They expressed understanding and appreciation.   [name], M.D., F.A.C.S. Gastrointestinal and Minimally Invasive Surgery [facility] [address] [place] [address] [place],  [id number] (  [phone] Main / Paging

## 2017-04-01 NOTE — H&P (View-Only) (Signed)
[name] 12/20/2016 11:52 AM Location: [facility] Patient #: [id number] DOB: 08/09/1955 Married / Language: Cleophus [name] / Race: White Female  Patient Care Team: Carollee Herter, Alferd Apa, DO as PCP - General (Family Medicine) Heath Lark, MD as Consulting Physician (Hematology and Oncology) Everitt Amber, MD as Consulting Physician (Obstetrics and Gynecology) Michael [name], MD as Consulting Physician (General Surgery) Alexis Frock, MD as Consulting Physician (Urology) Altheimer, Legrand Como, MD as Consulting Physician (Endocrinology) Katy Apo, MD as Consulting Physician (Ophthalmology) Stark Klein, MD as Consulting Physician (General Surgery) Irene Limbo, MD as Consulting Physician (Plastic Surgery)    History of Present Illness [name]; 12/20/2016 5:56 PM) The patient is a 61 year old female presenting for a post-operative visit. Note for "Post-Operative": ` ` ` Patient sent for surgical consultation at the request of Dr. Denman [name]  Chief Complaint: Request for colostomy takedown. 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 Had recurrence of endometrial cancer and pelvis adherent to the vagina and rectum. Underwent partial vaginectomy and low anterior resection with end colostomy. Also ureteral stents and bladder repair. 11/19/2014. Patient recovered well & went home. Week and a half later, patient developed pain and swelling and redness. Refractory to oral antibiotics. Admitted with IV antibiotics. Began to express stool between the colostomy and abdominal wall. Under went lap & abdominal wall exploration with relocation of colostomy 12/04/2014.  She is now 2 years out. She comes today with her husband. She had post adjuvant radiation. Has not need chemotherapy. No evidence of recurrence. In getting stent exchanges for her right ureter. Some  hydronephrosis chronic clear. She's been discussing with urologist about perhaps preoperative right ureter surgery for reconstruction or reimplantation to avoid a chronic stent exchanges. Wondered if that can be a coordinated case. Patient is urinating fine. Try and stay hydrated. Empties her colostomy about a every other day. Does not smoking. Activity level back to normal. Appetite been good.  (Review of systems as stated in this history (HPI) or in the review of systems. 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 TWO LYMPH NODES, NEGATIVE FOR ATYPIA OR MALIGNANCY (0/2). RESECTION MARGINS, NEGATIVE FOR ATYPIA OR MALIGNANCY. Microscopic Comment Please see Jashanti Clinkscale description for details. Aldona Bar MD Pathologist, Electronic Signature (Case signed 11/21/2014)       12/04/2014  PATIENT: [name] 61 y.o. female  Patient Care Team: Leana Gamer, MD as PCP - General (Internal Medicine) [name], MD as Referring Physician (Obstetrics and Gynecology) Heath Lark, MD as Consulting Physician (Hematology and Oncology) Everitt Amber, MD as Consulting Physician (Obstetrics and Gynecology) Michael [name], MD as Consulting Physician (General Surgery)  PRE-OPERATIVE DIAGNOSIS: Abdominal wall abscess, possible colostomy perforation/fistula  POST-OPERATIVE DIAGNOSIS:   Abdominal wall abscess secondary to colostomy perforation  PROCEDURE: Procedure(s): LAPROSCOPIC LYSIS OF ADHESIONS Laparoscopic mobilization of the splenic flexure RELOCATION OF COLOSTOMY DEBRIDEMENT INITIAL COLOSTOMY SITE  SURGEON: Surgeon(s): Michael [name], MD   11/19/2014  PATIENT: [name] 61 y.o. female  Patient Care Team: Leana Gamer, MD as PCP - General  (Internal Medicine) [name], MD as Referring Physician (Obstetrics and Gynecology) Heath Lark, MD as Consulting Physician (Hematology and Oncology) Everitt Amber, MD as Consulting Physician (Obstetrics and Gynecology)  PRE-OPERATIVE DIAGNOSIS: ENDOMETRIAL CANCER  POST-OPERATIVE DIAGNOSIS: 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  PROCEDURE:  LAPAROSCOPIC/ROBOTIC LYSIS OF ADHESIONS*^ EXPLORATORY LAPAROTOMY^ RESECTION OF PELVIC MASS^ RADICAL UPPER VAGINECTOMY^ LOW ANTERIOR RECTOSIGMOID RESECTION* END DESCENDING COLOSTOMY* BILATERAL URETERAL STENT PLACEMENT^ CYSTOTOMY CLOSURE^   SURGEON: : Everitt Amber, MD^ Michael [name], MD*  OR FINDINGS:  Patient had bulky tumor in the mid pelvis especially along the right lateral pelvic wall. Smooth encapsulation along the lateral sidewall but very thin margin. Anterior rectum and posterior vaginal wall densely adherent to it. 5 x 5 x 5 cm region. Some central necrosis.  No obvious metastatic disease on visceral parietal peritoneum or liver.  Patient has a end colostomy. There is a 1 cm rectal stump   Allergies [name], [name]; 12/20/2016 11:52 AM) Trimethoprim  Erythromycin *DERMATOLOGICALS*  Adhesive Tape 1"x5yd *MEDICAL DEVICES AND SUPPLIES*  Rash. Cipro *FLUOROQUINOLONES*  Penicillins  Swelling. Sulfa Antibiotics  Rash. Allergies Reconciled   Medication History [name], [name]; 12/20/2016 11:53 AM) Illusions AA Breast Prosthesis (1 (one)) Active. [id number] Post-mastectomy camisole - to hold drain tubes and wear during healing.; QTY: 2; Length of use: 3 Months 3 Refills ; L8000 Post-surgical bras - to hold breast prosthesis ; QTY: 6; Length of use:3 Months 3 Refills ; L8020Non-silicone breast prosthesis - for temporary use during healing or for comfort at end of day, lighter weight, cooler for hot weather.; QTY: 1 for single, 2 for bilateral ; Length of use: 6 months Up to 1 refill; Replacement ; Y3016 Silicone Breast  Prosthesis - to restore balance and symmetry after breast surgery; QTY: 1 for single, 2 for Bilateral; Length of use: 2 years No refills) Biotin (5MG  Tablet, Oral) Active. Calcium-Magnesium-Vitamin D (Oral) Specific strength unknown - Active. Vitamin D3 (50000UNIT Tablet, Oral once a week) Active. Benadryl Allergy (25MG  Tablet, Oral as needed) Active. Hyaluronate Sodium (Emollient) (0.1% Lotion, External as needed) Active. Claritin (10MG  Tablet, Oral) Active. MetFORMIN HCl (1000MG  Tablet, Oral) Active. Multivitamin (Oral) Active. Omega 3 (Oral) Specific strength unknown - Active. Omeprazole (20MG  Capsule DR, Oral) Active. MiraLax (Oral as needed) Active. Arimidex (1MG  Tablet, Oral as directed) Active. Synthroid (175MCG Tablet, Oral) Active. Valsartan (320MG  Tablet, Oral) Active. Januvia (100MG  Tablet, Oral) Active. Crestor (10MG  Tablet, Oral) Active. Medications Reconciled  Vitals [employer] RMA; 12/20/2016 11:54 AM) 12/20/2016 11:53 AM Weight: 210 lb Height: 62in Body Surface Area: 1.95 m Body Mass Index: 38.41 kg/m  Temp.: 98.63F  Pulse: 75 (Regular)  P.OX: 99% (Room air) BP: 110/70 (Sitting, Left Arm, Standard)       Physical Exam [name]; 12/20/2016 12:17 PM) General Mental Status-Alert. General Appearance-Not in acute distress. Voice-Normal.  Integumentary Global Assessment Normal Exam - Distribution of scalp and body hair is normal. General Characteristics Overall Skin Surface - no rashes and no suspicious lesions.  Head and Neck Head-normocephalic, atraumatic with no lesions or palpable masses. Face Global Assessment - atraumatic, no absence of expression. Neck Global Assessment - no abnormal movements, no decreased range of motion. Trachea-midline. Thyroid Gland Characteristics - non-tender.  Eye Eyeball - Left-Extraocular movements intact, No Nystagmus. Eyeball - Right-Extraocular movements intact,  No Nystagmus. Upper Eyelid - Left-No Cyanotic. Upper Eyelid - Right-No Cyanotic. Note: Chronic RIGHT eyelid partial droop stable. Wears glasses. Vision corrected   Chest and Lung Exam Inspection Accessory muscles - No use of accessory muscles in breathing.  Abdomen Note: Morbidly obese but soft. Robotic port site incisions well-healed. Ostomy LEFT upper quadrant pink with much less edema. No more induration. No cellulitis.  LEFT lower quadrant  former colostomy wound with wound VAC sponge in place. Removed. Wound is very superficial now. 5 mm deep at the most. 5 x 4 cm at surface. Switched over to packing.   Female Genitourinary Note: Foreshortened vagina but soft smooth posterior vaginal wall. Intact rectovaginal septum. No obvious masses or tumors.   Rectal Note: Perianal skin clean. Intact sphincter tone. 2-2.5 cm rectal cuff soft and intact. No obvious mass.   Peripheral Vascular Upper Extremity Inspection - Left - Not Gangrenous, No Petechiae. Right - Not Gangrenous, No Petechiae.  Neurologic Neurologic evaluation reveals -normal attention span and ability to concentrate, able to name objects and repeat phrases. Appropriate fund of knowledge and normal coordination.  Neuropsychiatric Mental status exam performed with findings of-able to articulate well with normal speech/language, rate, volume and coherence and no evidence of hallucinations, delusions, obsessions or homicidal/suicidal ideation. Orientation-oriented X3.  Musculoskeletal Global Assessment Gait and Station - normal gait and station.  Lymphatic General Lymphatics Description - No Generalized lymphadenopathy.    Assessment & Plan [name]; 12/20/2016 5:58 PM) ENDOMETRIAL CANCER (C54.1) Impression: Recurrent endometrial cancer requiring low anterior resection and partial vaginectomy with ureteral reimplantation and bladder repair.  She does have a little cuff left that I  could attempt to colostomy takedown. Would be coloanal stapled anastomosis. Not a classic J-pouch in side: 2 and, anal region.  Think is reasonable do laparoscopic lysis adhesions and see if I can get down to the region to make sure it healthy and clear. With that though, would need loop ileostomy diversion to help protect the anastomosis. Then takedown ileostomy 3 months later. May need IV fluids in the first 6 weeks to minimize dehydration. Antidiarrhea regimen.  I believe her urologist Dr. Tresa Moore would like to try and address the right hydronephrosis and chronic right ureter. See if ureteral reconstruction/reimplantation could be done. Reasonable to try and coordinate at the same time. Current Plans Pt Education - [facility] Pelvic Floor Exercises (Kegels) and Dysfunction HCI (Skyy Mcknight) COLOSTOMY IN PLACE (Z93.3) Impression: 2 years out from resection recurrent endometrial cancer with no evidence of recurrent disease.  She has a short but intact anorectal cuff. I think reasonable to attempt colostomy takedown. Plan a laparoscopic lysis of adhesions. Could do a robotically if that was what Dr. Tresa Moore was plan to do anyway. Most likely a side colon to end rectal stump like a pseudo J pouch.  She will need a protective loop ileostomy. Then plan ileostomy takedown in 3 months.  May benefit from pelvic floor physical therapy before and after ileostomy takedown to minimize issues of urgency or other discomfort.  We will work to coordinate possible surgery with Dr. Tresa Moore. I believe he wishes to address the right ureter to see if it can be reimplanted or bridged, etc. Would add more operative time but hopefully can address both issues at once. PREOP COLON - ENCOUNTER FOR PREOPERATIVE EXAMINATION FOR GENERAL SURGICAL PROCEDURE (Z01.818) Current Plans You are being scheduled for surgery- Our schedulers will call you.  You should hear from our office's scheduling department within 5 working days about the  location, date, and time of surgery. We try to make accommodations for patient's preferences in scheduling surgery, but sometimes the OR schedule or the surgeon's schedule prevents [place] from making those accommodations.  If you have not heard from our office [phone]) in 5 working days, call the office and ask for your surgeon's nurse.  If you have other questions about your diagnosis, plan, or surgery, call  the office and ask for your surgeon's nurse.  Written instructions provided Pt Education - [facility] Colon Bowel Prep 2015 Miralax/Antibiotics Restarted Neomycin Sulfate 500MG , 2 (two) Tablet Tablet SEE NOTE, #6, 12/20/2016, No Refill. Local Order: TAKE TWO TABLETS AT 2 PM, 3 PM, AND 10 PM THE DAY PRIOR TO SURGERY Restarted Flagyl 500MG , 2 (two) Tablet Tablet SEE NOTE, #6, 12/20/2016, No Refill. Local Order: Take at 2pm, 3pm, and 10pm the day prior to your colon operation The anatomy & physiology of the digestive tract was discussed. The pathophysiology of the colon was discussed. Natural history risks without surgery was discussed. I feel the risks of no intervention will lead to serious problems that outweigh the operative risks; therefore, I recommended a partial colectomy to remove the pathology. Minimally invasive (Robotic/Laparoscopic) & open techniques were discussed.  Risks such as bleeding, infection, abscess, leak, reoperation, possible ostomy, hernia, heart attack, death, and other risks were discussed. I noted a good likelihood this will help address the problem. Goals of post-operative recovery were discussed as well. Need for adequate nutrition, daily bowel regimen and healthy physical activity, to optimize recovery was noted as well. We will work to minimize complications. Educational materials were available as well. Questions were answered. The patient expresses understanding & wishes to proceed with surgery.  Pt Education - [facility] Colectomy post-op instructions:  discussed with patient and provided information. 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 Tresa Moore.  He is planning for potential excision,reimplantation, or reconstruction of the right ureteral stricture around the time of colostomy takedown. We'll try and coordinate with him.  [name], M.D., F.A.C.S. Gastrointestinal and Minimally Invasive Surgery [facility] [address] [place], [place] [address] [phone] Main / Paging

## 2017-04-01 NOTE — Interval H&P Note (Signed)
History and Physical Interval Note:  04/01/2017 7:15 AM  [name]  has presented today for surgery, with the diagnosis of recurrent endometrial cancer status post low anterior resection and  partial exoneration  Desire for colostomy takedown, RIGHT URETERAL STRICTURE  The various methods of treatment have been discussed with the patient and family. After consideration of risks, benefits and other options for treatment, the patient has consented to  Procedure(s) with comments: XI ROBOTIC VS LAPAROSCOPIC COLOSTOMY TAKEDOWN WITH LYSIS OF ADHESIONS. ERAS PATHWAY (N/A) - ERAS PATHWAY RIDGE PROCTOSCOPY (N/A) XI ROBOTICALLY ASSISTED LAPAROSCOPIC URETERAL RE-IMPLANTATION (Right) CYSTOSCOPY WITH RETROGRADE PYELOGRAM, URETEROSCOPY AND STENT PLACEMENT (Right) as a surgical intervention .  The patient's history has been reviewed, patient examined, no change in status, stable for surgery.  I have reviewed the patient's chart and labs.  Questions were answered to the patient's satisfaction.     Antwine Agosto

## 2017-04-01 NOTE — Consult Note (Signed)
PULMONARY / CRITICAL CARE MEDICINE   Name: [name] MRN: [id number] DOB: Sep 18, 1955    ADMISSION DATE:  04/01/2017 CONSULTATION DATE:  8/31 REFERRING MD:  Johney Maine CHIEF COMPLAINT:  Intubated post op  HISTORY OF PRESENT ILLNESS:   61 yr old with metastatic endometrial cancer, very complicated surgical history went today for a very long surgery (12hr) XI ROBOTIC LYSIS OF ADHESIONS x 5 hours, XI ROBOTIC COLOSTOMY TAKEDOWN, ROBOTIC SEWN COLOANAL ANASTOMOSIS, Ileocecal resection with anastomosis, Diverting loop ileostomy,  Cystoscopy with Rt retrograde and stent exchange. Rt robotic ureteral reimplant with psoas hitch.   Patient came from the OR intubated. She is hypotensive on phenylephrine. She is also hypothermic and did receive 6 litre of IVF. Patient is off sedation at the moment.   PAST MEDICAL HISTORY :  She  has a past medical history of Acute bacterial bronchitis (06/04/2015); Anemia in neoplastic disease; Benign essential HTN; Breast cancer of upper-inner quadrant of left female breast ([place]); Breast cancer, left [facility]) (dx 10-30-2015  oncologist-  dr Heath Lark); Cancer of corpus uteri, except isthmus [facility]) (dx 10-15-2004 oncologist-- dr Denman [name] and dr Alvy Bimler ); Cellulitis of left abdominal wall near colostomy (12/03/2014); Chronic idiopathic neutropenia ([place]); Complication of anesthesia; Diabetic retinopathy, background ([place]); DM type 2 (diabetes mellitus, type 2) ([place]); Dysuria; GERD (gastroesophageal reflux disease); Hiatal hernia; History of bronchitis; History of gastric polyp; History of radiation therapy; Hypothyroidism; Mixed dyslipidemia; Multiple thyroid nodules; PONV (postoperative nausea and vomiting); Radiation-induced dermatitis; Right flank pain; S/P colostomy ([place]); Seasonal allergies; Shoulder stiffness; Ureteral stricture, right (UROLOGIT-  DR [facility]); Vitamin D deficiency; and Wears glasses.  PAST SURGICAL HISTORY: She  has a past surgical history that includes Appendectomy;  Tonsillectomy; Colonoscopy with propofol (N/A, 08/21/2013); EUS (N/A, 10/02/2014); Robotic assisted lap vaginal hysterectomy (N/A, 11/19/2014); Ostomy (N/A, 11/19/2014); Colostomy takedown (N/A, 12/04/2014); Cystoscopy with retrograde pyelogram, ureteroscopy and stent placement (Right, 03/20/2015); EXCISION SOFT TISSUE MASS RIGHT FOREMAN (12-08-2006); Cystoscopy with retrograde pyelogram, ureteroscopy and stent placement (Right, 05/02/2015); Cystoscopy with retrograde pyelogram, ureteroscopy and stent placement (Right, 09/05/2015); Cystoscopy w/ retrogrades (Right, 11/21/2015); Cystoscopy w/ ureteral stent placement (Right, 11/21/2015); Mastectomy (Right, 12/11/2015); Mastectomy complete / simple w/ sentinel node biopsy (Left, 12/11/2015); Mastectomy w/ sentinel node biopsy (Bilateral, 12/11/2015); Breast reconstruction with placement of tissue expander and flex hd (acellular hydrated dermis) (Bilateral, 12/11/2015); Incision and drainage of wound (Bilateral, 12/26/2015); Tissue expander filling (Bilateral, 12/26/2015); Laparoscopic cholecystectomy (1990); Eye surgery (as child); Total abdominal hysterectomy (March 2006   [facility]); Cystoscopy w/ ureteral stent placement (Right, 03/10/2016); Removal of bilateral tissue expanders with placement of bilateral breast implants (Bilateral, 04/16/2016); Liposuction with lipofilling (Bilateral, 04/16/2016); Cystoscopy w/ ureteral stent placement (Right, 06/30/2016); and Cystoscopy with stent placement (Right, 10/27/2016).  Allergies  Allergen Reactions  . Penicillins Swelling    Facial swelling Has patient had a PCN reaction causing immediate rash, facial/tongue/throat swelling, SOB or lightheadedness with hypotension: Yes Has patient had a PCN reaction causing severe rash involving mucus membranes or skin necrosis: Yes Has patient had a PCN reaction that required hospitalization No Has patient had a PCN reaction occurring within the last 10 years: No If all of the above answers are "NO",  then may proceed with Cephalosporin use.   [name] Kitchen Ultram [Tramadol] Hives  . Adhesive [Tape]     blisters  . Cefaclor Rash    Ceclor  . Erythromycin     Gastritis, abd cramps  . Trimethoprim Rash  . Ciprofloxacin Other (See Comments)    Unknown On Dr notes   .  Pectin Rash    Pectin ring for stoma  . Sulfa Antibiotics Rash    No current facility-administered medications on file prior to encounter.    Current Outpatient Prescriptions on File Prior to Encounter  Medication Sig  . anastrozole (ARIMIDEX) 1 MG tablet TAKE 1 TABLET DAILY  . Biotin 5 MG TABS Take 5 mg by mouth every morning.   . Calcium-Magnesium-Vitamin D 400-166.7-133.3 MG-MG-UNIT TABS Take 1 tablet by mouth daily.   . Cholecalciferol (VITAMIN D3) 10000 UNITS capsule Take 10,000 Units by mouth once a week. Sunday evening's  . diphenhydrAMINE (BENADRYL) 25 MG tablet Take 25 mg by mouth at bedtime as needed for itching or sleep. Reported on 09/15/2015  . filgrastim (NEUPOGEN) 480 MCG/1.6ML injection Inject 1.6 ml under the skin every 5 days for life (Patient taking differently: Inject 480 mcg into the skin See admin instructions. Inject 1.6 ml under the skin every 6 days for life)  . levothyroxine (SYNTHROID, LEVOTHROID) 150 MCG tablet Take 150 mcg by mouth daily.  [name] Kitchen loratadine (CLARITIN) 10 MG tablet Take 10 mg by mouth daily.   . metFORMIN (GLUCOPHAGE) 1000 MG tablet Take 1,000 mg by mouth 2 (two) times daily with a meal.   . Polyethyl Glycol-Propyl Glycol (SYSTANE OP) Place 1 drop into both eyes at bedtime.   . Prenatal Vit-Fe Fumarate-FA (PRENATAL VITAMIN PO) Take 1 capsule by mouth daily. Takes prenatal because there are no dyes in it  . rosuvastatin (CRESTOR) 10 MG tablet Take 10 mg by mouth every evening.   . sitaGLIPtin (JANUVIA) 100 MG tablet Take 100 mg by mouth every morning.   [name] Kitchen omega-3 acid ethyl esters (LOVAZA) 1 G capsule Take 1 g by mouth 2 (two) times daily.    FAMILY HISTORY:  Her indicated that her mother  is deceased. She indicated that her father is alive. She indicated that the status of her sister is unknown. She indicated that the status of her brother is unknown. She indicated that her maternal grandmother is deceased. She indicated that her maternal grandfather is deceased. She indicated that her paternal grandmother is deceased. She indicated that her paternal grandfather is deceased. She indicated that both of her maternal aunts are deceased. She indicated that her paternal aunt is deceased. She indicated that her other is deceased.    SOCIAL HISTORY: She  reports that she has never smoked. She has never used smokeless tobacco. She reports that she drinks alcohol.  REVIEW OF SYSTEMS:   Could not be obtained due to being intubated  VITAL SIGNS: BP 121/77   Pulse 84   Temp (!) 93.2 F (34 C) (Rectal)   Resp 15   Ht 5' 2.5" (1.588 m)   Wt 96.2 kg (212 lb)   SpO2 100%   BMI 38.16 kg/m   HEMODYNAMICS:  on phenylephrine 25  VENTILATOR SETTINGS: Vent Mode: PRVC FiO2 (%):  [60 %] 60 % Set Rate:  [14 bmp] 14 bmp Vt Set:  [410 mL] 410 mL PEEP:  [5 cmH20] 5 cmH20 Plateau Pressure:  [14 cmH20] 14 cmH20  INTAKE / OUTPUT: I/O last 3 completed shifts: In: 7829 [I.V.:4200; Blood:630; IV FAOZHYQMV:784] Out: 1700 [Urine:600; Blood:1100]  PHYSICAL EXAMINATION: General:  Intubated looks chronically ill Neuro:  Sedated  HEENT:  Intubated  Cardiovascular:  Normal heart sounds no murmurs  Lungs:  Clear equal air sounds  Abdomen:  Stoma and left UQ wound  Musculoskeletal:  No edema  Skin:  No rash   LABS:  BMET  Recent Labs Lab 03/28/17 1455 04/01/17 1423 04/01/17 1430 04/01/17 1623  NA 140 139 139 139  K 4.2 3.6 3.6 4.0  CL 103  --   --   --   CO2 29  --   --   --   BUN 24*  --   --   --   CREATININE 0.73  --   --   --   GLUCOSE 86  --   --   --     Electrolytes  Recent Labs Lab 03/28/17 1455  CALCIUM 9.8    CBC  Recent Labs Lab 04/01/17 1430  04/01/17 1623 04/01/17 2000  WBC  --   --  22.6*  HGB 7.8* 9.9* 10.0*  HCT 23.0* 29.0* 29.7*  PLT  --   --  240    Coag's No results for input(s): APTT, INR in the last 168 hours.  Sepsis Markers No results for input(s): LATICACIDVEN, PROCALCITON, O2SATVEN in the last 168 hours.  ABG No results for input(s): PHART, PCO2ART, PO2ART in the last 168 hours.  Liver Enzymes No results for input(s): AST, ALT, ALKPHOS, BILITOT, ALBUMIN in the last 168 hours.  Cardiac Enzymes No results for input(s): TROPONINI, PROBNP in the last 168 hours.  Glucose  Recent Labs Lab 03/28/17 1410 04/01/17 0554  GLUCAP 74 102*    Imaging Dg Abd 1 View  Result Date: 04/01/2017 CLINICAL DATA:  Endometrial cancer. EXAM: ABDOMEN - 1 VIEW; DG C-ARM 1-60 MIN-NO REPORT Radiation exposure index:  21.67 mGy. COMPARISON:  CT scan of January 27, 2017. FINDINGS: Six fluoroscopic images were obtained during cystoscopy. These images demonstrate exchange of right-sided ureteral stent which extends from right intrarenal collecting system to urinary bladder. IMPRESSION: Successful exchange of right-sided ureteral stent. Electronically Signed   By: Marijo Conception, M.D.   On: 04/01/2017 09:11   Portable Chest Xray  Result Date: 04/01/2017 CLINICAL DATA:  Acute respiratory failure EXAM: PORTABLE CHEST 1 VIEW COMPARISON:  08/25/2014 FINDINGS: Endotracheal tube tip is about 14 mm superior to the carina. Esophageal tube tip is below the diaphragm but not included on the image. Right-sided central venous catheter tip overlies the distal SVC. Hazy perihilar infiltrates. No effusion. No pneumothorax. IMPRESSION: 1. Endotracheal tube tip about 1.4 cm superior to carina 2. Hazy perihilar opacity may reflect atelectasis or mild perihilar infiltrates. Electronically Signed   By: Donavan Foil M.D.   On: 04/01/2017 23:04   Dg C-arm 1-60 Min-no Report  Result Date: 04/01/2017 Fluoroscopy was utilized by the requesting physician.  No  radiographic interpretation.      ANTIBIOTICS: clinda + genta X1  SIGNIFICANT EVENTS: OR 8/31  LINES/TUBES: Art line   ASSESSMENT / PLAN: Metastatic endometrial CA s/p prolonged sx to take down colostomy with multiple adhesios. Please refer to OR note. Coming in with hypovolemic shock and acute hypoxemic respiratory failure requiring mechanical ventilation    PULMONARY A: Acute resp failure post op requiring mechanical ventilation   P:   Vent protocol   CARDIOVASCULAR A:  Hypovolemic shock requiring vasopressors   P:  Received 6 litres of IVF - wean down phenylephrine - IVF as needed - serial CBC   RENAL A:   Post ureteral stent and reimplantation   P:   Follow urine output Stat BMP   GASTROINTESTINAL A:   Extensive GI surgery  P:   NPO   HEMATOLOGIC A:   Leukocytosis ? Stress related. She has chronic neutropenia  P:  Follow WBC On clindamycin  Consider broadening Abx if signs of sepsis   INFECTIOUS A:   No signs of infections  P:   On clinda as per Sx  ENDOCRINE A:   No issues  P:   None   NEUROLOGIC A:   Sedated  metabolic encephalopathy secondary to sedation  P:   RASS goal: -2 Pain control Propofol   FAMILY  - Updates: none available    - Inter-disciplinary family meet or Palliative Care meeting due by:  9/7  I have spent 40 mins of CC time bedside or in the unit exclusive of bilabe procedures. Patient is needing ICU due to resp failure requiring mechanical ventilation and hypovolemic shock requiring vasopressors    Pulmonary and [place] Pager: [phone]  04/01/2017, 11:49 PM

## 2017-04-01 NOTE — Brief Op Note (Signed)
04/01/2017  8:53 PM  PATIENT:  [name]  61 y.o. female  PRE-OPERATIVE DIAGNOSIS:  recurrent endometrial cancer status post low anterior resection and  partial exoneration  Desire for colostomy takedown, RIGHT URETERAL STRICTURE  POST-OPERATIVE DIAGNOSIS:  recurrent endometrial cancer status post low anterior resection and  partial exoneration  Desire for colostomy takedown, RIGHT URETERAL STRICTURE```````````````````````````````````````````````````````````````````````````````````````````````````````````````````  PROCEDURE:  Cystoscopy with Rt retrograde and stent exchange. Rt robotic ureteral reimplant with psoas hitch.  SURGEON:  Surgeon(s) and Role:  Hubbard Robinson, MD - Primary  PHYSICIAN ASSISTANT:   ASSISTANTS: none   ANESTHESIA:   general  EBL:  Total I/O In: 300 [I.V.:300] Out: 370 [Urine:70; Blood:300]  BLOOD ADMINISTERED:none  DRAINS: JP to bulb, foley to gravity, iliosotmy   LOCAL MEDICATIONS USED:  MARCAINE     SPECIMEN:  Source of Specimen:  per general surgery  DISPOSITION OF SPECIMEN:  N/A  COUNTS:  YES  TOURNIQUET:  * No tourniquets in log *  DICTATION: .Other Dictation: Dictation Number  [id number]  PLAN OF CARE: Admit to inpatient   PATIENT DISPOSITION:  ICU - intubated and hemodynamically stable.   Delay start of Pharmacological VTE agent (>24hrs) due to surgical blood loss or risk of bleeding: yes

## 2017-04-02 ENCOUNTER — Inpatient Hospital Stay (HOSPITAL_COMMUNITY): Payer: BLUE CROSS/BLUE SHIELD

## 2017-04-02 DIAGNOSIS — R571 Hypovolemic shock: Secondary | ICD-10-CM

## 2017-04-02 DIAGNOSIS — J9601 Acute respiratory failure with hypoxia: Secondary | ICD-10-CM

## 2017-04-02 DIAGNOSIS — N135 Crossing vessel and stricture of ureter without hydronephrosis: Secondary | ICD-10-CM

## 2017-04-02 LAB — CBC
HEMATOCRIT: 31.4 % — AB (ref 36.0–46.0)
Hemoglobin: 10.4 g/dL — ABNORMAL LOW (ref 12.0–15.0)
MCH: 28.9 pg (ref 26.0–34.0)
MCHC: 33.1 g/dL (ref 30.0–36.0)
MCV: 87.2 fL (ref 78.0–100.0)
PLATELETS: 316 10*3/uL (ref 150–400)
RBC: 3.6 MIL/uL — ABNORMAL LOW (ref 3.87–5.11)
RDW: 16.3 % — AB (ref 11.5–15.5)
WBC: 38.9 10*3/uL — AB (ref 4.0–10.5)

## 2017-04-02 LAB — BLOOD GAS, ARTERIAL
ACID-BASE DEFICIT: 9.1 mmol/L — AB (ref 0.0–2.0)
BICARBONATE: 17.8 mmol/L — AB (ref 20.0–28.0)
DRAWN BY: 308601
FIO2: 60
LHR: 15 {breaths}/min
MECHVT: 410 mL
O2 SAT: 99.1 %
PATIENT TEMPERATURE: 93.2
PCO2 ART: 38.6 mmHg (ref 32.0–48.0)
PEEP: 5 cmH2O
PH ART: 7.265 — AB (ref 7.350–7.450)
PO2 ART: 208 mmHg — AB (ref 83.0–108.0)

## 2017-04-02 LAB — BASIC METABOLIC PANEL
ANION GAP: 14 (ref 5–15)
Anion gap: 12 (ref 5–15)
BUN: 21 mg/dL — AB (ref 6–20)
BUN: 23 mg/dL — AB (ref 6–20)
CALCIUM: 8.3 mg/dL — AB (ref 8.9–10.3)
CHLORIDE: 108 mmol/L (ref 101–111)
CO2: 17 mmol/L — AB (ref 22–32)
CO2: 19 mmol/L — ABNORMAL LOW (ref 22–32)
CREATININE: 1.7 mg/dL — AB (ref 0.44–1.00)
Calcium: 8.7 mg/dL — ABNORMAL LOW (ref 8.9–10.3)
Chloride: 109 mmol/L (ref 101–111)
Creatinine, Ser: 1.36 mg/dL — ABNORMAL HIGH (ref 0.44–1.00)
GFR calc Af Amer: 47 mL/min — ABNORMAL LOW (ref >60)
GFR calc Af Amer: 36 mL/min — ABNORMAL LOW (ref >60)
GFR, EST NON AFRICAN AMERICAN: 41 mL/min — AB (ref >60)
GFR, EST NON AFRICAN AMERICAN: 31 mL/min — AB (ref >60)
GLUCOSE: 202 mg/dL — AB (ref 65–99)
GLUCOSE: 177 mg/dL — AB (ref 65–99)
POTASSIUM: 4.4 mmol/L (ref 3.5–5.1)
Potassium: 4.6 mmol/L (ref 3.5–5.1)
SODIUM: 140 mmol/L (ref 135–145)
Sodium: 139 mmol/L (ref 135–145)

## 2017-04-02 LAB — GLUCOSE, CAPILLARY
GLUCOSE-CAPILLARY: 151 mg/dL — AB (ref 65–99)
Glucose-Capillary: 155 mg/dL — ABNORMAL HIGH (ref 65–99)
Glucose-Capillary: 110 mg/dL — ABNORMAL HIGH (ref 65–99)
Glucose-Capillary: 142 mg/dL — ABNORMAL HIGH (ref 65–99)

## 2017-04-02 LAB — PHOSPHORUS: Phosphorus: 7.4 mg/dL — ABNORMAL HIGH (ref 2.5–4.6)

## 2017-04-02 LAB — MAGNESIUM
MAGNESIUM: 1.2 mg/dL — AB (ref 1.7–2.4)
Magnesium: 1.2 mg/dL — ABNORMAL LOW (ref 1.7–2.4)

## 2017-04-02 LAB — MRSA PCR SCREENING: MRSA by PCR: NEGATIVE

## 2017-04-02 MED ORDER — DIPHENHYDRAMINE HCL 50 MG/ML IJ SOLN: 12.5000 mg | Freq: Four times a day (QID) | INTRAMUSCULAR | Status: DC | PRN

## 2017-04-02 MED ORDER — INSULIN ASPART 100 UNIT/ML ~~LOC~~ SOLN
0.0000 [IU] | Freq: Three times a day (TID) | SUBCUTANEOUS | Status: DC
  Administered 2017-04-02 – 2017-04-03 (×3): 3 [IU] via SUBCUTANEOUS
  Administered 2017-04-03 – 2017-04-04 (×3): 2 [IU] via SUBCUTANEOUS
  Administered 2017-04-04 – 2017-04-05 (×2): 3 [IU] via SUBCUTANEOUS
  Administered 2017-04-05: 2 [IU] via SUBCUTANEOUS
  Administered 2017-04-05: 3 [IU] via SUBCUTANEOUS
  Administered 2017-04-06 – 2017-04-07 (×3): 2 [IU] via SUBCUTANEOUS

## 2017-04-02 MED ORDER — MAGNESIUM SULFATE 4 GM/100ML IV SOLN
4.0000 g | Freq: Once | INTRAVENOUS | Status: AC
  Administered 2017-04-02: 4 g via INTRAVENOUS
  Filled 2017-04-02: qty 100

## 2017-04-02 MED ORDER — ROSUVASTATIN CALCIUM 10 MG PO TABS
10.0000 mg | ORAL_TABLET | Freq: Every evening | ORAL | Status: DC
  Administered 2017-04-03 – 2017-04-06 (×4): 10 mg via ORAL
  Filled 2017-04-02 (×4): qty 1

## 2017-04-02 MED ORDER — ANASTROZOLE 1 MG PO TABS
1.0000 mg | ORAL_TABLET | Freq: Every day | ORAL | Status: DC
  Administered 2017-04-02 – 2017-04-07 (×6): 1 mg via ORAL
  Filled 2017-04-02 (×6): qty 1

## 2017-04-02 MED ORDER — INSULIN ASPART 100 UNIT/ML ~~LOC~~ SOLN: 0.0000 [IU] | Freq: Every day | SUBCUTANEOUS | Status: DC

## 2017-04-02 MED ORDER — METHOCARBAMOL 1000 MG/10ML IJ SOLN
1000.0000 mg | Freq: Four times a day (QID) | INTRAVENOUS | Status: DC | PRN
  Filled 2017-04-02: qty 10

## 2017-04-02 MED ORDER — ONDANSETRON HCL 4 MG PO TABS: 4.0000 mg | ORAL_TABLET | Freq: Four times a day (QID) | ORAL | Status: DC | PRN

## 2017-04-02 MED ORDER — ENOXAPARIN SODIUM 40 MG/0.4ML ~~LOC~~ SOLN
40.0000 mg | Freq: Every day | SUBCUTANEOUS | Status: DC
  Administered 2017-04-03: 40 mg via SUBCUTANEOUS
  Filled 2017-04-02: qty 0.4

## 2017-04-02 MED ORDER — PROMETHAZINE HCL 25 MG/ML IJ SOLN
6.2500 mg | INTRAMUSCULAR | Status: DC | PRN
Start: 2017-04-02 — End: 2017-04-03

## 2017-04-02 MED ORDER — SODIUM CHLORIDE 0.9 % IV SOLN
INTRAVENOUS | Status: DC
  Administered 2017-04-02 (×2): via INTRAVENOUS

## 2017-04-02 MED ORDER — ALUM & MAG HYDROXIDE-SIMETH 200-200-20 MG/5ML PO SUSP: 30.0000 mL | Freq: Four times a day (QID) | ORAL | Status: DC | PRN

## 2017-04-02 MED ORDER — ASPIRIN EC 81 MG PO TBEC
81.0000 mg | DELAYED_RELEASE_TABLET | Freq: Every day | ORAL | Status: DC
  Administered 2017-04-02 – 2017-04-07 (×6): 81 mg via ORAL
  Filled 2017-04-02 (×6): qty 1

## 2017-04-02 MED ORDER — GUAIFENESIN-DM 100-10 MG/5ML PO SYRP: 10.0000 mL | ORAL_SOLUTION | ORAL | Status: DC | PRN

## 2017-04-02 MED ORDER — DIPHENHYDRAMINE HCL 12.5 MG/5ML PO ELIX: 12.5000 mg | ORAL_SOLUTION | Freq: Four times a day (QID) | ORAL | Status: DC | PRN

## 2017-04-02 MED ORDER — HYDRALAZINE HCL 20 MG/ML IJ SOLN
5.0000 mg | Freq: Four times a day (QID) | INTRAMUSCULAR | Status: DC | PRN
  Filled 2017-04-02: qty 1

## 2017-04-02 MED ORDER — SACCHAROMYCES BOULARDII 250 MG PO CAPS
250.0000 mg | ORAL_CAPSULE | Freq: Two times a day (BID) | ORAL | Status: DC
  Administered 2017-04-02 – 2017-04-04 (×6): 250 mg via ORAL
  Filled 2017-04-02 (×6): qty 1

## 2017-04-02 MED ORDER — GABAPENTIN 300 MG PO CAPS
300.0000 mg | ORAL_CAPSULE | Freq: Two times a day (BID) | ORAL | Status: DC
  Administered 2017-04-02 – 2017-04-07 (×11): 300 mg via ORAL
  Filled 2017-04-02 (×11): qty 1

## 2017-04-02 MED ORDER — LINAGLIPTIN 5 MG PO TABS
5.0000 mg | ORAL_TABLET | Freq: Every day | ORAL | Status: DC
  Administered 2017-04-02 – 2017-04-07 (×6): 5 mg via ORAL
  Filled 2017-04-02 (×6): qty 1

## 2017-04-02 MED ORDER — ORAL CARE MOUTH RINSE
15.0000 mL | Freq: Four times a day (QID) | OROMUCOSAL | Status: DC
  Administered 2017-04-02 – 2017-04-07 (×11): 15 mL via OROMUCOSAL

## 2017-04-02 MED ORDER — MENTHOL 3 MG MT LOZG: 1.0000 | LOZENGE | OROMUCOSAL | Status: DC | PRN

## 2017-04-02 MED ORDER — CHLORHEXIDINE GLUCONATE 0.12% ORAL RINSE (MEDLINE KIT)
15.0000 mL | Freq: Two times a day (BID) | OROMUCOSAL | Status: DC
  Administered 2017-04-02 – 2017-04-06 (×6): 15 mL via OROMUCOSAL

## 2017-04-02 MED ORDER — LEVOTHYROXINE SODIUM 75 MCG PO TABS
150.0000 ug | ORAL_TABLET | Freq: Every day | ORAL | Status: DC
  Administered 2017-04-02 – 2017-04-07 (×6): 150 ug via ORAL
  Filled 2017-04-02 (×7): qty 2

## 2017-04-02 MED ORDER — PROCHLORPERAZINE EDISYLATE 5 MG/ML IJ SOLN: 5.0000 mg | INTRAMUSCULAR | Status: DC | PRN

## 2017-04-02 MED ORDER — CLINDAMYCIN PHOSPHATE 900 MG/50ML IV SOLN
900.0000 mg | Freq: Three times a day (TID) | INTRAVENOUS | Status: AC
  Administered 2017-04-02: 900 mg via INTRAVENOUS
  Filled 2017-04-02: qty 50

## 2017-04-02 MED ORDER — ENOXAPARIN SODIUM 40 MG/0.4ML ~~LOC~~ SOLN: 40.0000 mg | Freq: Every day | SUBCUTANEOUS | Status: DC

## 2017-04-02 MED ORDER — ACETAMINOPHEN 500 MG PO TABS
1000.0000 mg | ORAL_TABLET | Freq: Three times a day (TID) | ORAL | Status: DC
  Administered 2017-04-02 – 2017-04-07 (×16): 1000 mg via ORAL
  Filled 2017-04-02 (×16): qty 2

## 2017-04-02 MED ORDER — HYDROCORTISONE 2.5 % RE CREA
1.0000 "application " | TOPICAL_CREAM | Freq: Four times a day (QID) | RECTAL | Status: DC | PRN
  Filled 2017-04-02: qty 28.35

## 2017-04-02 MED ORDER — MAGIC MOUTHWASH
15.0000 mL | Freq: Four times a day (QID) | ORAL | Status: DC | PRN
  Administered 2017-04-04: 15 mL via ORAL
  Filled 2017-04-02: qty 15

## 2017-04-02 MED ORDER — HYDROMORPHONE HCL 1 MG/ML IJ SOLN
0.5000 mg | INTRAMUSCULAR | Status: DC | PRN
  Administered 2017-04-02: 0.5 mg via INTRAVENOUS
  Administered 2017-04-03 (×2): 1 mg via INTRAVENOUS
  Filled 2017-04-02 (×4): qty 1

## 2017-04-02 MED ORDER — SODIUM CHLORIDE 0.9 % IV SOLN
0.0000 ug/min | INTRAVENOUS | Status: DC
  Administered 2017-04-02: 100 ug/min via INTRAVENOUS
  Administered 2017-04-02: 160 ug/min via INTRAVENOUS
  Administered 2017-04-02: 140 ug/min via INTRAVENOUS
  Filled 2017-04-02 (×4): qty 4

## 2017-04-02 MED ORDER — ALVIMOPAN 12 MG PO CAPS
12.0000 mg | ORAL_CAPSULE | Freq: Two times a day (BID) | ORAL | Status: DC
  Administered 2017-04-02 – 2017-04-03 (×4): 12 mg via ORAL
  Filled 2017-04-02 (×5): qty 1

## 2017-04-02 MED ORDER — LIP MEDEX EX OINT
1.0000 "application " | TOPICAL_OINTMENT | Freq: Two times a day (BID) | CUTANEOUS | Status: DC
  Administered 2017-04-02 – 2017-04-07 (×11): 1 via TOPICAL
  Filled 2017-04-02 (×4): qty 7

## 2017-04-02 MED ORDER — HYDROMORPHONE HCL 1 MG/ML IJ SOLN: 0.2500 mg | INTRAMUSCULAR | Status: DC | PRN

## 2017-04-02 MED ORDER — LORATADINE 10 MG PO TABS
10.0000 mg | ORAL_TABLET | Freq: Every day | ORAL | Status: DC
  Administered 2017-04-02 – 2017-04-07 (×6): 10 mg via ORAL
  Filled 2017-04-02 (×6): qty 1

## 2017-04-02 MED ORDER — PHENOL 1.4 % MT LIQD
1.0000 | OROMUCOSAL | Status: DC | PRN
  Filled 2017-04-02: qty 177

## 2017-04-02 MED ORDER — LACTATED RINGERS IV SOLN: 1000.0000 mL | Freq: Three times a day (TID) | INTRAVENOUS | Status: DC | PRN

## 2017-04-02 MED ORDER — ONDANSETRON HCL 4 MG/2ML IJ SOLN: 4.0000 mg | Freq: Four times a day (QID) | INTRAMUSCULAR | Status: DC | PRN

## 2017-04-02 MED ORDER — HYDROCORTISONE 1 % EX CREA
1.0000 "application " | TOPICAL_CREAM | Freq: Three times a day (TID) | CUTANEOUS | Status: DC | PRN
  Filled 2017-04-02: qty 28

## 2017-04-02 MED ORDER — METOPROLOL TARTRATE 5 MG/5ML IV SOLN: 5.0000 mg | Freq: Four times a day (QID) | INTRAVENOUS | Status: DC | PRN

## 2017-04-02 NOTE — Progress Notes (Signed)
PULMONARY / CRITICAL CARE MEDICINE   Name: [name] MRN: [id number] DOB: 01/11/1956    ADMISSION DATE:  04/01/2017 CONSULTATION DATE:  8/31 REFERRING MD:  Johney Maine CHIEF COMPLAINT:  Intubated post op  HISTORY OF PRESENT ILLNESS:   61 yr old with metastatic endometrial cancer, very complicated surgical history went today for a very long surgery (12hr) XI ROBOTIC LYSIS OF ADHESIONS x 5 hours, XI ROBOTIC COLOSTOMY TAKEDOWN, ROBOTIC SEWN COLOANAL ANASTOMOSIS, Ileocecal resection with anastomosis, Diverting loop ileostomy,  Cystoscopy with Rt retrograde and stent exchange. Rt robotic ureteral reimplant with psoas hitch.   Patient came from the OR intubated. She is hypotensive on phenylephrine. She is also hypothermic and did receive 6 litre of IVF. Patient is off sedation at the moment.   PAST MEDICAL HISTORY :  She  has a past medical history of Acute bacterial bronchitis (06/04/2015); Anemia in neoplastic disease; Benign essential HTN; Breast cancer of upper-inner quadrant of left female breast ([place]); Breast cancer, left [facility]) (dx 10-30-2015  oncologist-  dr Heath Lark); Cancer of corpus uteri, except isthmus [facility]) (dx 10-15-2004 oncologist-- dr Denman [name] and dr Alvy Bimler ); Cellulitis of left abdominal wall near colostomy (12/03/2014); Chronic idiopathic neutropenia ([place]); Complication of anesthesia; Diabetic retinopathy, background ([place]); DM type 2 (diabetes mellitus, type 2) ([place]); Dysuria; GERD (gastroesophageal reflux disease); Hiatal hernia; History of bronchitis; History of gastric polyp; History of radiation therapy; Hypothyroidism; Mixed dyslipidemia; Multiple thyroid nodules; PONV (postoperative nausea and vomiting); Radiation-induced dermatitis; Right flank pain; S/P colostomy ([place]); Seasonal allergies; Shoulder stiffness; Ureteral stricture, right (UROLOGIT-  DR [facility]); Vitamin D deficiency; and Wears glasses.  PAST SURGICAL HISTORY: She  has a past surgical history that includes Appendectomy;  Tonsillectomy; Colonoscopy with propofol (N/A, 08/21/2013); EUS (N/A, 10/02/2014); Robotic assisted lap vaginal hysterectomy (N/A, 11/19/2014); Ostomy (N/A, 11/19/2014); Colostomy takedown (N/A, 12/04/2014); Cystoscopy with retrograde pyelogram, ureteroscopy and stent placement (Right, 03/20/2015); EXCISION SOFT TISSUE MASS RIGHT FOREMAN (12-08-2006); Cystoscopy with retrograde pyelogram, ureteroscopy and stent placement (Right, 05/02/2015); Cystoscopy with retrograde pyelogram, ureteroscopy and stent placement (Right, 09/05/2015); Cystoscopy w/ retrogrades (Right, 11/21/2015); Cystoscopy w/ ureteral stent placement (Right, 11/21/2015); Mastectomy (Right, 12/11/2015); Mastectomy complete / simple w/ sentinel node biopsy (Left, 12/11/2015); Mastectomy w/ sentinel node biopsy (Bilateral, 12/11/2015); Breast reconstruction with placement of tissue expander and flex hd (acellular hydrated dermis) (Bilateral, 12/11/2015); Incision and drainage of wound (Bilateral, 12/26/2015); Tissue expander filling (Bilateral, 12/26/2015); Laparoscopic cholecystectomy (1990); Eye surgery (as child); Total abdominal hysterectomy (March 2006   [facility]); Cystoscopy w/ ureteral stent placement (Right, 03/10/2016); Removal of bilateral tissue expanders with placement of bilateral breast implants (Bilateral, 04/16/2016); Liposuction with lipofilling (Bilateral, 04/16/2016); Cystoscopy w/ ureteral stent placement (Right, 06/30/2016); and Cystoscopy with stent placement (Right, 10/27/2016).  Allergies  Allergen Reactions  . Penicillins Swelling    Facial swelling Has patient had a PCN reaction causing immediate rash, facial/tongue/throat swelling, SOB or lightheadedness with hypotension: Yes Has patient had a PCN reaction causing severe rash involving mucus membranes or skin necrosis: Yes Has patient had a PCN reaction that required hospitalization No Has patient had a PCN reaction occurring within the last 10 years: No If all of the above answers are "NO",  then may proceed with Cephalosporin use.   [name] Kitchen Ultram [Tramadol] Hives  . Adhesive [Tape]     blisters  . Cefaclor Rash    Ceclor  . Erythromycin     Gastritis, abd cramps  . Trimethoprim Rash  . Ciprofloxacin Other (See Comments)    Unknown On Dr notes   .  Pectin Rash    Pectin ring for stoma  . Sulfa Antibiotics Rash    No current facility-administered medications on file prior to encounter.    Current Outpatient Prescriptions on File Prior to Encounter  Medication Sig  . anastrozole (ARIMIDEX) 1 MG tablet TAKE 1 TABLET DAILY  . Biotin 5 MG TABS Take 5 mg by mouth every morning.   . Calcium-Magnesium-Vitamin D 400-166.7-133.3 MG-MG-UNIT TABS Take 1 tablet by mouth daily.   . Cholecalciferol (VITAMIN D3) 10000 UNITS capsule Take 10,000 Units by mouth once a week. Sunday evening's  . diphenhydrAMINE (BENADRYL) 25 MG tablet Take 25 mg by mouth at bedtime as needed for itching or sleep. Reported on 09/15/2015  . filgrastim (NEUPOGEN) 480 MCG/1.6ML injection Inject 1.6 ml under the skin every 5 days for life (Patient taking differently: Inject 480 mcg into the skin See admin instructions. Inject 1.6 ml under the skin every 6 days for life)  . levothyroxine (SYNTHROID, LEVOTHROID) 150 MCG tablet Take 150 mcg by mouth daily.  [name] Kitchen loratadine (CLARITIN) 10 MG tablet Take 10 mg by mouth daily.   . metFORMIN (GLUCOPHAGE) 1000 MG tablet Take 1,000 mg by mouth 2 (two) times daily with a meal.   . Polyethyl Glycol-Propyl Glycol (SYSTANE OP) Place 1 drop into both eyes at bedtime.   . Prenatal Vit-Fe Fumarate-FA (PRENATAL VITAMIN PO) Take 1 capsule by mouth daily. Takes prenatal because there are no dyes in it  . rosuvastatin (CRESTOR) 10 MG tablet Take 10 mg by mouth every evening.   . sitaGLIPtin (JANUVIA) 100 MG tablet Take 100 mg by mouth every morning.   [name] Kitchen omega-3 acid ethyl esters (LOVAZA) 1 G capsule Take 1 g by mouth 2 (two) times daily.    FAMILY HISTORY:  Her indicated that her mother  is deceased. She indicated that her father is alive. She indicated that the status of her sister is unknown. She indicated that the status of her brother is unknown. She indicated that her maternal grandmother is deceased. She indicated that her maternal grandfather is deceased. She indicated that her paternal grandmother is deceased. She indicated that her paternal grandfather is deceased. She indicated that both of her maternal aunts are deceased. She indicated that her paternal aunt is deceased. She indicated that her other is deceased.    SOCIAL HISTORY: She  reports that she has never smoked. She has never used smokeless tobacco. She reports that she drinks alcohol.  REVIEW OF SYSTEMS:   Could not be obtained due to being intubated   VITAL SIGNS: BP (!) 128/56   Pulse 81   Temp 97.6 F (36.4 C) (Axillary)   Resp 16   Ht 5' 2.5" (1.588 m)   Wt 222 lb 0.1 oz (100.7 kg)   SpO2 100%   BMI 39.96 kg/m   HEMODYNAMICS: CVP:  [5 mmHg] 5 mmHgon phenylephrine 25  VENTILATOR SETTINGS: Vent Mode: PSV;CPAP FiO2 (%):  [40 %-60 %] 40 % Set Rate:  [14 bmp-15 bmp] 15 bmp Vt Set:  [410 mL] 410 mL PEEP:  [5 cmH20] 5 cmH20 Pressure Support:  [5 cmH20] 5 cmH20 Plateau Pressure:  [13 cmH20-14 cmH20] 14 cmH20  INTAKE / OUTPUT: I/O last 3 completed shifts: In: 7321.9 [I.V.:6191.9; Blood:630; IV WOEHOZYYQ:825] Out: 2650 [Urine:800; Drains:450; Blood:1400]  PHYSICAL EXAMINATION: Blood pressure (!) 128/56, pulse 81, temperature 97.6 F (36.4 C), temperature source Axillary, resp. rate 16, height 5' 2.5" (1.588 m), weight 222 lb 0.1 oz (100.7 kg), SpO2 100 %. Gen:  No acute distress HEENT:  EOMI, sclera anicteric, ETT Neck:     No masses; no thyromegaly Lungs:    Clear to auscultation bilaterally; normal respiratory effort CV:         Regular rate and rhythm; no murmurs Abd:      + bowel sounds; soft, non-tender; no palpable masses, no distension Ext:    No edema; adequate peripheral  perfusion Skin:      Warm and dry; no rash Neuro: Sedated, awakens to command.  LABS:  BMET  Recent Labs Lab 03/28/17 1455  04/01/17 1623 04/01/17 2258 04/02/17 0402  NA 140  < > 139 139 140  K 4.2  < > 4.0 4.4 4.6  CL 103  --   --  108 109  CO2 29  --   --  17* 19*  BUN 24*  --   --  21* 23*  CREATININE 0.73  --   --  1.36* 1.70*  GLUCOSE 86  --   --  202* 177*  < > = values in this interval not displayed.  Electrolytes  Recent Labs Lab 03/28/17 1455 04/01/17 2258 04/02/17 0402  CALCIUM 9.8 8.7* 8.3*  MG  --  1.2* 1.2*  PHOS  --  7.4*  --     CBC  Recent Labs Lab 04/01/17 1623 04/01/17 2000 04/02/17 0402  WBC  --  22.6* 38.9*  HGB 9.9* 10.0* 10.4*  HCT 29.0* 29.7* 31.4*  PLT  --  240 316    Coag's No results for input(s): APTT, INR in the last 168 hours.  Sepsis Markers No results for input(s): LATICACIDVEN, PROCALCITON, O2SATVEN in the last 168 hours.  ABG  Recent Labs Lab 04/01/17 2350  PHART 7.265*  PCO2ART 38.6  PO2ART 208*    Liver Enzymes No results for input(s): AST, ALT, ALKPHOS, BILITOT, ALBUMIN in the last 168 hours.  Cardiac Enzymes No results for input(s): TROPONINI, PROBNP in the last 168 hours.  Glucose  Recent Labs Lab 03/28/17 1410 04/01/17 0554 04/02/17 0800  GLUCAP 74 102* 151*    Imaging Portable Chest Xray  Result Date: 04/01/2017 CLINICAL DATA:  Acute respiratory failure EXAM: PORTABLE CHEST 1 VIEW COMPARISON:  08/25/2014 FINDINGS: Endotracheal tube tip is about 14 mm superior to the carina. Esophageal tube tip is below the diaphragm but not included on the image. Right-sided central venous catheter tip overlies the distal SVC. Hazy perihilar infiltrates. No effusion. No pneumothorax. IMPRESSION: 1. Endotracheal tube tip about 1.4 cm superior to carina 2. Hazy perihilar opacity may reflect atelectasis or mild perihilar infiltrates. Electronically Signed   By: Donavan Foil M.D.   On: 04/01/2017 23:04   Dg Abd  Portable 1v  Result Date: 04/02/2017 CLINICAL DATA:  OG tube placement EXAM: PORTABLE ABDOMEN - 1 VIEW COMPARISON:  None available FINDINGS: OG tube within the collapsed stomach body in the left abdomen. Right ureteral stent in place. Jejunal feeding tube over the lower abdomen and pelvis. Nonobstructive bowel gas pattern. IMPRESSION: OG tube within the stomach. Electronically Signed   By: Jerilynn Mages.  Shick M.D.   On: 04/02/2017 10:26   ANTIBIOTICS: clinda + genta X1  SIGNIFICANT EVENTS: OR 8/31  LINES/TUBES: Art line   ASSESSMENT / PLAN: Metastatic endometrial CA s/p prolonged sx to take down colostomy with multiple adhesios. Please refer to OR note. Coming in with hypovolemic shock and acute hypoxemic respiratory failure requiring mechanical ventilation   PULMONARY A: Acute resp failure post op requiring mechanical ventilation   P:  Continue when support SBTs when mental status allows  CARDIOVASCULAR A:  Hypovolemic shock requiring vasopressors   P:  Continue IV fluids Wean down Neo-Synephrine  RENAL A:   Post ureteral stent and reimplantation  Acute kidney injury P:   Repeat lytes Follow urine output and creatinine  GASTROINTESTINAL A:   Extensive GI surgery  P:   Keep NPO Management per surgery  HEMATOLOGIC A:   Leukocytosis ? Stress related. She has chronic neutropenia  P:  Follow WBC On clindamycin  Blood cultures she spikes.  INFECTIOUS A:   No signs of infections  P:   On clinda for surgical prophylaxis  ENDOCRINE A:   No issues  P:   None   NEUROLOGIC A:   Sedated  metabolic encephalopathy secondary to sedation  P:   RASS goal: -2 Pain control Propofol gtt  FAMILY  - Updates: none available   - Inter-disciplinary family meet or Palliative Care meeting due by:  9/7  The patient is critically ill with multiple organ system failure and requires high complexity decision making for assessment and support, frequent evaluation and titration  of therapies, advanced monitoring, review of radiographic studies and interpretation of complex data.   Critical Care Time devoted to patient care services, exclusive of separately billable procedures, described in this note is 35 minutes.   Marshell Garfinkel MD [facility] Pager [phone] If no answer or after 3pm call: [phone] 04/02/2017, 10:39 AM

## 2017-04-02 NOTE — Progress Notes (Signed)
Arterial line removed due to misplacement.  MD/RN aware, RT to monitor and assess as needed.

## 2017-04-02 NOTE — Progress Notes (Signed)
Urology Inpatient Progress Report  recurrent endometrial cancer status post low anterior resection and  partial exoneration  Desire for colostomy takedown  Procedure(s): XI ROBOTIC VS LAPAROSCOPIC COLOSTOMY TAKEDOWN WITH LYSIS OF ADHESIONS. RIDGE PROCTOSCOPY XI ROBOTICALLY ASSISTED LAPAROSCOPIC URETERAL RE-IMPLANTATION CYSTOSCOPY WITH RETROGRADE PYELOGRAM, URETEROSCOPY AND STENT PLACEMENT  1 Day Post-Op   Intv/Subj: Remains intubated this AM, will try again this PM for extubation when less sedated. Neo titrated to maintain BP, stable at 60. NGT passed this AM KUB confirms good position of right ureteral stent.  Principal Problem:   Pelvic cancer s/p colostomy takedown/loop ileostomy diversion 04/01/2017 Active Problems:   Primary hypothyroidism   DM type 2 (diabetes mellitus, type 2) ([facility])   Benign essential HTN   History of ovarian & endometrial cancer   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 s/p resection/partial vaginectomy/ LAR/colostomy 11/19/2014   Endotracheally intubated   Morbid obesity ([place])   Ureteral stricture, right, s/p resection & bladder hitch reimplantation 04/01/2017   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   Hypovolemic shock ([place])  Current Facility-Administered Medications  Medication Dose Route Frequency Provider Last Rate Last Dose  . 0.9 %  sodium chloride infusion   Intravenous Continuous Michael [name], MD 50 mL/hr at 04/02/17 0400    . acetaminophen (TYLENOL) tablet 1,000 mg  1,000 mg Oral TID Michael [name], MD   1,000 mg at 04/02/17 0946  . alum & mag hydroxide-simeth (MAALOX/MYLANTA) 200-200-20 MG/5ML suspension 30 mL  30 mL Oral Q6H PRN Michael [name], MD      . alvimopan (ENTEREG) capsule 12 mg  12 mg Oral BID Michael [name], MD   12 mg at 04/02/17 0947  . anastrozole (ARIMIDEX) tablet 1 mg  1 mg Oral Daily Michael [name], MD   1 mg at 04/02/17 0949  . aspirin EC tablet 81 mg  81 mg Oral Daily Michael [name], MD   81 mg at 04/02/17 0945  . chlorhexidine  gluconate (MEDLINE KIT) (PERIDEX) 0.12 % solution 15 mL  15 mL Mouth Rinse BID Michael [name], MD      . diphenhydrAMINE (BENADRYL) 12.5 MG/5ML elixir 12.5 mg  12.5 mg Oral Q6H PRN Michael [name], MD       Or  . diphenhydrAMINE (BENADRYL) injection 12.5 mg  12.5 mg Intravenous Q6H PRN Michael [name], MD      . Derrill Memo ON 04/03/2017] enoxaparin (LOVENOX) injection 40 mg  40 mg Subcutaneous QHS Michael [name], MD      . fentaNYL (SUBLIMAZE) 2,500 mcg in sodium chloride 0.9 % 250 mL (10 mcg/mL) infusion  25-400 mcg/hr Intravenous Continuous Rigoberto Noel, MD   Stopped at 04/02/17 [phone]  . fentaNYL (SUBLIMAZE) bolus via infusion 50 mcg  50 mcg Intravenous Q1H PRN Rigoberto Noel, MD   50 mcg at 04/02/17 0615  . gabapentin (NEURONTIN) capsule 300 mg  300 mg Oral BID Michael [name], MD   300 mg at 04/02/17 0946  . guaiFENesin-dextromethorphan (ROBITUSSIN DM) 100-10 MG/5ML syrup 10 mL  10 mL Oral Q4H PRN Michael [name], MD      . hydrALAZINE (APRESOLINE) injection 5-20 mg  5-20 mg Intravenous Q6H PRN Michael [name], MD      . hydrocortisone (ANUSOL-HC) 2.5 % rectal cream 1 application  1 application Topical QID PRN Michael [name], MD      . hydrocortisone cream 1 % 1 application  1 application Topical TID PRN Michael [name], MD      . HYDROmorphone (DILAUDID) injection 0.25-0.5 mg  0.25-0.5 mg Intravenous Q5 min  PRN Myrtie Soman, MD      . HYDROmorphone (DILAUDID) injection 0.5-2 mg  0.5-2 mg Intravenous Q2H PRN Michael [name], MD      . insulin aspart (novoLOG) injection 0-15 Units  0-15 Units Subcutaneous TID Carlyn Reichert Michael [name], MD   3 Units at 04/02/17 [phone]  . insulin aspart (novoLOG) injection 0-5 Units  0-5 Units Subcutaneous QHS Michael [name], MD      . lactated ringers infusion 1,000 mL  1,000 mL Intravenous Q8H PRN Gross, Remo Lipps, MD      . levothyroxine (SYNTHROID, LEVOTHROID) tablet 150 mcg  150 mcg Oral QAC breakfast Michael [name], MD   150 mcg at 04/02/17 0946  . linagliptin (TRADJENTA) tablet 5 mg  5 mg Oral  Daily Michael [name], MD   5 mg at 04/02/17 0945  . lip balm (CARMEX) ointment 1 application  1 application Topical BID Michael [name], MD   1 application at 24/40/10 [phone]  . loratadine (CLARITIN) tablet 10 mg  10 mg Oral Daily Michael [name], MD   10 mg at 04/02/17 0945  . magic mouthwash  15 mL Oral QID PRN Michael [name], MD      . MEDLINE mouth rinse  15 mL Mouth Rinse QID Gross, Remo Lipps, MD      . menthol-cetylpyridinium (CEPACOL) lozenge 3 mg  1 lozenge Oral PRN Michael [name], MD      . methocarbamol (ROBAXIN) 1,000 mg in dextrose 5 % 50 mL IVPB  1,000 mg Intravenous Q6H PRN Michael [name], MD      . metoprolol tartrate (LOPRESSOR) injection 5 mg  5 mg Intravenous Q6H PRN Michael [name], MD      . ondansetron [facility]) tablet 4 mg  4 mg Oral Q6H PRN Michael [name], MD       Or  . ondansetron (ZOFRAN) injection 4 mg  4 mg Intravenous Q6H PRN Michael [name], MD      . pantoprazole (PROTONIX) injection 40 mg  40 mg Intravenous Daily Rigoberto Noel, MD   40 mg at 04/02/17 0947  . phenol (CHLORASEPTIC) mouth spray 1-2 spray  1-2 spray Mouth/Throat PRN Michael [name], MD      . phenylephrine (NEO-SYNEPHRINE) 40 mg in sodium chloride 0.9 % 250 mL (0.16 mg/mL) infusion  0-200 mcg/min Intravenous Titrated Berton Mount, RPH 60 mL/hr at 04/02/17 0805 160 mcg/min at 04/02/17 0805  . prochlorperazine (COMPAZINE) injection 5-10 mg  5-10 mg Intravenous Q4H PRN Michael [name], MD      . promethazine (PHENERGAN) injection 6.25-12.5 mg  6.25-12.5 mg Intravenous Q15 min PRN Myrtie Soman, MD      . propofol (DIPRIVAN) 1000 MG/100ML infusion  0-50 mcg/kg/min Intravenous Continuous Rigoberto Noel, MD   Stopped at 04/02/17 [phone]  . rosuvastatin (CRESTOR) tablet 10 mg  10 mg Oral QPM Michael [name], MD      . saccharomyces boulardii (FLORASTOR) capsule 250 mg  250 mg Oral BID Michael [name], MD   250 mg at 04/02/17 0946     Objective: Vital: Vitals:   04/02/17 0900 04/02/17 0915 04/02/17 0930 04/02/17 0945  BP: 119/60  (!) 114/42 (!) 105/21 126/88  Pulse:      Resp: _0 Temp:      TempSrc:      SpO2: 100% 100% 100% 100%  Weight:      Height:       I/Os: I/O last 3 completed shifts: In: 7321.9 [I.V.:6191.9; Blood:630; IV [id number]:563] Out: 2650 [Urine:800; Drains:450; Blood:1400]  Physical  Exam:  General: Patient is in no apparent distress Lungs: Normal respiratory effort, chest expands symmetrically. GI: Incisions are c/d/i.  The abdomen is soft and appropriately tender JP drain with serosanguinous drainage Foley: clear urine Ext: lower extremities symmetric  Lab Results:  Recent Labs  04/01/17 1623 04/01/17 2000 04/02/17 0402  WBC  --  22.6* 38.9*  HGB 9.9* 10.0* 10.4*  HCT 29.0* 29.7* 31.4*    Recent Labs  04/01/17 1623 04/01/17 2258 04/02/17 0402  NA 139 139 140  K 4.0 4.4 4.6  CL  --  108 109  CO2  --  17* 19*  GLUCOSE  --  202* 177*  BUN  --  21* 23*  CREATININE  --  1.36* 1.70*  CALCIUM  --  8.7* 8.3*   No results for input(s): LABPT, INR in the last 72 hours. No results for input(s): LABURIN in the last 72 hours. Results for orders placed or performed during the hospital encounter of 04/01/17  MRSA PCR Screening     Status: None   Collection Time: 04/02/17  2:02 AM  Result Value Ref Range Status   MRSA by PCR NEGATIVE NEGATIVE Final    Comment:        The GeneXpert MRSA Assay (FDA approved for NASAL specimens only), is one component of a comprehensive MRSA colonization surveillance program. It is not intended to diagnose MRSA infection nor to guide or monitor treatment for MRSA infections.     Studies/Results: Dg Abd 1 View  Result Date: 04/01/2017 CLINICAL DATA:  Endometrial cancer. EXAM: ABDOMEN - 1 VIEW; DG C-ARM 1-60 MIN-NO REPORT Radiation exposure index:  21.67 mGy. COMPARISON:  CT scan of January 27, 2017. FINDINGS: Six fluoroscopic images were obtained during cystoscopy. These images demonstrate exchange of right-sided ureteral stent which  extends from right intrarenal collecting system to urinary bladder. IMPRESSION: Successful exchange of right-sided ureteral stent. Electronically Signed   By: Marijo Conception, M.D.   On: 04/01/2017 09:11   Portable Chest Xray  Result Date: 04/01/2017 CLINICAL DATA:  Acute respiratory failure EXAM: PORTABLE CHEST 1 VIEW COMPARISON:  08/25/2014 FINDINGS: Endotracheal tube tip is about 14 mm superior to the carina. Esophageal tube tip is below the diaphragm but not included on the image. Right-sided central venous catheter tip overlies the distal SVC. Hazy perihilar infiltrates. No effusion. No pneumothorax. IMPRESSION: 1. Endotracheal tube tip about 1.4 cm superior to carina 2. Hazy perihilar opacity may reflect atelectasis or mild perihilar infiltrates. Electronically Signed   By: Donavan Foil M.D.   On: 04/01/2017 23:04   Dg C-arm 1-60 Min-no Report  Result Date: 04/01/2017 Fluoroscopy was utilized by the requesting physician.  No radiographic interpretation.    Assessment: Procedure(s): XI ROBOTIC VS LAPAROSCOPIC COLOSTOMY TAKEDOWN WITH LYSIS OF ADHESIONS. RIDGE PROCTOSCOPY XI ROBOTICALLY ASSISTED LAPAROSCOPIC URETERAL RE-IMPLANTATION CYSTOSCOPY WITH RETROGRADE PYELOGRAM, URETEROSCOPY AND STENT PLACEMENT, 1 Day Post-Op  doing well. Foley draining clear urine, JP output fairly high, don't suspect urine leak at this time, but will continue to monitor.   Plan: Routine care per surgery Consider sending JP for creatinine prior removal if output continues to be high. Will continue to follow.  Repeating KUB again after NGT gets advanced further.   Louis Meckel, MD Urology 04/02/2017, 10:24 AM

## 2017-04-02 NOTE — Progress Notes (Signed)
Pharmacy - Brief Note (RN communication)  Pharmacy received request from RN to quad strength phenylephrine for increasing requirements/infusion rate.  RN instructed to adjust pump settings for new concentration with new bag.  Doreene Eland, PharmD, BCPS.   Pager: [phone] 04/02/2017 7:29 AM

## 2017-04-02 NOTE — Consult Note (Signed)
[facility] Nurse ostomy consult note Stoma type/location: RLQ loop Ileosotomy, s/p colostomy takedown Stomal assessment/size: Pouch intact.  Stoma appears edematous and bowel protruding through stomal opening 3 cm today.   Peristomal assessment: pouch intact Treatment options for stomal/peristomal skin: Cera Plus barriers as patient was sensitive to other [employer] products Output Blood tinged liquid only in pouch today Ostomy pouching: 2pc. 2 1/4" pouch sets with barrier rings left in room for pouch change on Monday.  Written booklet given to spouse, at bedside.  Education provided: Questions answered regarding diversion from small vs. Large intestine, caustic nature of ileostomy effluent and possibility that undigested food particles may be more obvious than with colostomy.  Touched on blockage and that we would discuss this later when patient is awake.  She is just coming off of sedation at this time and barely opens her eyes. Pouch change Monday.  Spouse states he will be here.  Enrolled patient in [facility] program: No WOC team will follow and remain available to patient, medical and nursing teams.   Domenic Moras RN BSN [place] Pager [phone]

## 2017-04-02 NOTE — Progress Notes (Signed)
eLink Physician-Brief Progress Note Patient Name: [name] DOB: May 30, 1956 MRN: [id number]   Date of Service  04/02/2017  HPI/Events of Note  Hypotension, on neo gtt   eICU Interventions  Obtain CVp , minimise propofol gtt hypomag -repleted     Intervention Category Intermediate Interventions: Electrolyte abnormality - evaluation and management;Hypotension - evaluation and management  Yoanna Jurczyk V. 04/02/2017, 4:48 AM

## 2017-04-02 NOTE — Progress Notes (Addendum)
[address] [place], [place] [address] Phone: [phone]  FAX: [phone]      Contrina Orona [id number] [birth year]  CARE TEAM:  PCP: Ann Held, DO  Outpatient Care Team: Patient Care Team: Carollee Herter, Alferd Apa, DO as PCP - General (Family Medicine) Heath Lark, MD as Consulting Physician (Hematology and Oncology) Everitt Amber, MD as Consulting Physician (Obstetrics and Gynecology) Michael [name], MD as Consulting Physician (General Surgery) Alexis Frock, MD as Consulting Physician (Urology) Altheimer, Legrand Como, MD as Consulting Physician (Endocrinology) Katy Apo, MD as Consulting Physician (Ophthalmology) Stark Klein, MD as Consulting Physician (General Surgery) Irene Limbo, MD as Consulting Physician (Plastic Surgery)  Inpatient Treatment Team: Treatment Team: Attending Provider: Michael [name], MD; Consulting Physician: Alexis Frock, MD; Rounding Team: Pccm, Md, MD; Registered Nurse: Chales Abrahams, RN; Registered Nurse: Irven Baltimore, RN   Problem List:   Principal Problem:   Pelvic cancer s/p colostomy takedown/loop ileostomy diversion 04/01/2017 Active Problems:   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 s/p resection/partial vaginectomy/ LAR/colostomy 11/19/2014   Ureteral stricture, right, s/p resection & bladder hitch reimplantation 04/01/2017   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   Primary hypothyroidism   DM type 2 (diabetes mellitus, type 2) ([place])   Benign essential HTN   History of ovarian & endometrial cancer   Endotracheally intubated   Morbid obesity ([place])   Hypovolemic shock ([place])   1 Day Post-Op  04/01/2017  POST-OPERATIVE DIAGNOSIS:    Recurrent endometrial cancer status post low anterior resection and partial pelvic exeneration   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   SURGEON:  [name], MD- Primary (Dr Tresa Moore  assist) PROCEDURE:   XI ROBOTIC LYSIS OF ADHESIONS x 5 hours XI ROBOTIC COLOSTOMY TAKEDOWN ROBOTIC SEWN COLOANAL ANASTOMOSIS Ileocecal resection with anastomosis Diverting loop ileostomy  SURGEON: Alexis Frock, MD - Primary (Dr Johney Maine assist) PROCEDURE:   Cystoscopy with Rt retrograde and stent exchange. Rt robotic ureteral reimplant with psoas hitch.    Assessment  Stabilizing  Plan:  -wean vent - hopefully extubating later this AM -volume -follow Cr - inc but UOP OK -transfuse PRN Hgb <7 -clears PO to start - adv slowly w prolonged adhesiolysis & long case -glc control -VTE prophylaxis- SCDs, etc.  Resume lovenox if Hgb OK tomorrow -mobilize as tolerated to help recovery -drain care - keep 10 days minimum -foley per Urology - suspect 2-3 weeks minimum w extensive surgery & poor tissues  45 minutes spent in review, evaluation, examination, counseling, and coordination of care.  More than 50% of that time was spent in counseling.  I updated the patient's status to the patient and spouse.  Recommendations were made.  Questions were answered.  They expressed understanding & appreciation.   [name], M.D., F.A.C.S. Gastrointestinal and Minimally Invasive Surgery [facility] [address] [place], [place] [address] [phone] Main / Paging   04/02/2017    Subjective: (Chief complaint)  Intubated Waving at wave, following commands, answering questions ICU RN & RT at bedside Husband at bedside Events noted  Objective:  Vital signs:  Vitals:   04/02/17 0600 04/02/17 0618 04/02/17 0632 04/02/17 0703  BP: (!) 98/35 (!) 120/91 107/65 (!) 93/56  Pulse:      Resp: (!) 9 15 14 15   Temp:      TempSrc:      SpO2:    100%  Weight:  Height:           Intake/Output   Yesterday:  08/31 0701 - 09/01 0700 In: 7321.9 [I.V.:6191.9; Blood:630; IV Piggyback:500] Out: 9833 [Urine:800; Drains:450; Blood:1400] This  shift:  No intake/output data recorded.  Bowel function:  Flatus: No  BM:  No  Drain: Serosanguinous   Physical Exam:  General: Pt awake/alert/oriented x4 in no acute distress Eyes: PERRL, normal EOM.  Sclera clear.  No icterus Neuro: CN II-XII intact w/o focal sensory/motor deficits. Lymph: No head/neck/groin lymphadenopathy Psych:  No delerium/psychosis/paranoia HENT: Normocephalic, Mucus membranes moist.  No thrush.  ETT in place Neck: Supple, No tracheal deviation Chest: No chest wall pain w good excursion CV:  Pulses intact.  Regular rhythm MS: Normal AROM mjr joints.  No obvious deformity  Abdomen: Somewhat firm.  Mildy distended.  Mildly tender at incisions only.  No evidence of peritonitis.  No incarcerated hernias.  RUQ ileostomy mildly dusky.  Mild flank bruising  Ext:  No deformity.  No mjr edema.  No cyanosis Skin: No petechiae / purpura  Results:   Labs: Results for orders placed or performed during the hospital encounter of 04/01/17 (from the past 48 hour(s))  Glucose, capillary     Status: Abnormal   Collection Time: 04/01/17  5:54 AM  Result Value Ref Range   Glucose-Capillary 102 (H) 65 - 99 mg/dL  Hemoglobin A1c     Status: None   Collection Time: 04/01/17  6:30 AM  Result Value Ref Range   Hgb A1c MFr Bld 5.3 4.8 - 5.6 %    Comment: (NOTE) Pre diabetes:          5.7%-6.4% Diabetes:              >6.4% Glycemic control for   <7.0% adults with diabetes    Mean Plasma Glucose 105.41 mg/dL    Comment: Performed at [facility], [address], [place],  [address]  Type and screen [place]     Status: None (Preliminary result)   Collection Time: 04/01/17  6:44 AM  Result Value Ref Range   ABO/RH(D) A POS    Antibody Screen NEG    Sample Expiration 04/04/2017    Unit Number [id number]    Blood Component Type RED CELLS,LR    Unit division 00    Status of Unit ISSUED,FINAL    Transfusion Status OK TO TRANSFUSE     Crossmatch Result Compatible    Unit Number [id number]    Blood Component Type RED CELLS,LR    Unit division 00    Status of Unit ISSUED,FINAL    Transfusion Status OK TO TRANSFUSE    Crossmatch Result Compatible    Unit Number [id number]    Blood Component Type RED CELLS,LR    Unit division 00    Status of Unit ALLOCATED    Transfusion Status OK TO TRANSFUSE    Crossmatch Result Compatible    Unit Number [id number]    Blood Component Type RED CELLS,LR    Unit division 00    Status of Unit ALLOCATED    Transfusion Status OK TO TRANSFUSE    Crossmatch Result Compatible   POCT I-Stat EG7     Status: Abnormal   Collection Time: 04/01/17  2:23 PM  Result Value Ref Range   pH, Ven 7.308 7.250 - 7.430   pCO2, Ven 45.6 44.0 - 60.0 mmHg   pO2, Ven 42.0 32.0 - 45.0 mmHg   Bicarbonate 23.3 20.0 -  28.0 mmol/L   TCO2 25 22 - 32 mmol/L   O2 Saturation 78.0 %   Acid-base deficit 3.0 (H) 0.0 - 2.0 mmol/L   Sodium 139 135 - 145 mmol/L   Potassium 3.6 3.5 - 5.1 mmol/L   Calcium, Ion 1.16 1.15 - 1.40 mmol/L   HCT 23.0 (L) 36.0 - 46.0 %   Hemoglobin 7.8 (L) 12.0 - 15.0 g/dL   Patient temperature 35.2 C    Sample type VENOUS   POCT I-Stat EG7     Status: Abnormal   Collection Time: 04/01/17  2:30 PM  Result Value Ref Range   pH, Ven 7.325 7.250 - 7.430   pCO2, Ven 43.4 (L) 44.0 - 60.0 mmHg   pO2, Ven 44.0 32.0 - 45.0 mmHg   Bicarbonate 23.1 20.0 - 28.0 mmol/L   TCO2 25 22 - 32 mmol/L   O2 Saturation 81.0 %   Acid-base deficit 3.0 (H) 0.0 - 2.0 mmol/L   Sodium 139 135 - 145 mmol/L   Potassium 3.6 3.5 - 5.1 mmol/L   Calcium, Ion 1.17 1.15 - 1.40 mmol/L   HCT 23.0 (L) 36.0 - 46.0 %   Hemoglobin 7.8 (L) 12.0 - 15.0 g/dL   Patient temperature 35.2 C    Sample type VENOUS   Prepare RBC     Status: None   Collection Time: 04/01/17  3:00 PM  Result Value Ref Range   Order Confirmation ORDER PROCESSED BY BLOOD BANK   POCT I-Stat EG7     Status: Abnormal   Collection Time:  04/01/17  4:23 PM  Result Value Ref Range   pH, Ven 7.272 7.250 - 7.430   pCO2, Ven 44.6 44.0 - 60.0 mmHg   pO2, Ven 56.0 (H) 32.0 - 45.0 mmHg   Bicarbonate 21.1 20.0 - 28.0 mmol/L   TCO2 23 22 - 32 mmol/L   O2 Saturation 88.0 %   Acid-base deficit 6.0 (H) 0.0 - 2.0 mmol/L   Sodium 139 135 - 145 mmol/L   Potassium 4.0 3.5 - 5.1 mmol/L   Calcium, Ion 1.13 (L) 1.15 - 1.40 mmol/L   HCT 29.0 (L) 36.0 - 46.0 %   Hemoglobin 9.9 (L) 12.0 - 15.0 g/dL   Patient temperature 35.0 C    Sample type VENOUS   Prepare RBC     Status: None   Collection Time: 04/01/17  7:21 PM  Result Value Ref Range   Order Confirmation ORDER PROCESSED BY BLOOD BANK   CBC     Status: Abnormal   Collection Time: 04/01/17  8:00 PM  Result Value Ref Range   WBC 22.6 (H) 4.0 - 10.5 K/uL   RBC 3.46 (L) 3.87 - 5.11 MIL/uL   Hemoglobin 10.0 (L) 12.0 - 15.0 g/dL   HCT 29.7 (L) 36.0 - 46.0 %   MCV 85.8 78.0 - 100.0 fL   MCH 28.9 26.0 - 34.0 pg   MCHC 33.7 30.0 - 36.0 g/dL   RDW 15.7 (H) 11.5 - 15.5 %   Platelets 240 150 - 400 K/uL  Basic metabolic panel     Status: Abnormal   Collection Time: 04/01/17 10:58 PM  Result Value Ref Range   Sodium 139 135 - 145 mmol/L   Potassium 4.4 3.5 - 5.1 mmol/L   Chloride 108 101 - 111 mmol/L   CO2 17 (L) 22 - 32 mmol/L   Glucose, Bld 202 (H) 65 - 99 mg/dL   BUN 21 (H) 6 - 20 mg/dL   Creatinine, Ser  1.36 (H) 0.44 - 1.00 mg/dL   Calcium 8.7 (L) 8.9 - 10.3 mg/dL   GFR calc non Af Amer 41 (L) >60 mL/min   GFR calc Af Amer 47 (L) >60 mL/min    Comment: (NOTE) The eGFR has been calculated using the CKD EPI equation. This calculation has not been validated in all clinical situations. eGFR's persistently <60 mL/min signify possible Chronic Kidney Disease.    Anion gap 14 5 - 15  Magnesium     Status: Abnormal   Collection Time: 04/01/17 10:58 PM  Result Value Ref Range   Magnesium 1.2 (L) 1.7 - 2.4 mg/dL  Phosphorus     Status: Abnormal   Collection Time: 04/01/17 10:58 PM   Result Value Ref Range   Phosphorus 7.4 (H) 2.5 - 4.6 mg/dL  Draw ABG 1 hour after initiation of ventilator     Status: Abnormal   Collection Time: 04/01/17 11:50 PM  Result Value Ref Range   FIO2 60.00    Delivery systems VENTILATOR    Mode PRESSURE REGULATED VOLUME CONTROL    VT 410 mL   LHR 15 resp/min   Peep/cpap 5.0 cm H20   pH, Arterial 7.265 (L) 7.350 - 7.450   pCO2 arterial 38.6 32.0 - 48.0 mmHg   pO2, Arterial 208 (H) 83.0 - 108.0 mmHg   Bicarbonate 17.8 (L) 20.0 - 28.0 mmol/L   Acid-base deficit 9.1 (H) 0.0 - 2.0 mmol/L   O2 Saturation 99.1 %   Patient temperature 93.2    Collection site RIGHT RADIAL    Drawn by [id number]    Sample type ARTERIAL DRAW    Allens test (pass/fail) PASS PASS  MRSA PCR Screening     Status: None   Collection Time: 04/02/17  2:02 AM  Result Value Ref Range   MRSA by PCR NEGATIVE NEGATIVE    Comment:        The GeneXpert MRSA Assay (FDA approved for NASAL specimens only), is one component of a comprehensive MRSA colonization surveillance program. It is not intended to diagnose MRSA infection nor to guide or monitor treatment for MRSA infections.   Basic metabolic panel     Status: Abnormal   Collection Time: 04/02/17  4:02 AM  Result Value Ref Range   Sodium 140 135 - 145 mmol/L   Potassium 4.6 3.5 - 5.1 mmol/L   Chloride 109 101 - 111 mmol/L   CO2 19 (L) 22 - 32 mmol/L   Glucose, Bld 177 (H) 65 - 99 mg/dL   BUN 23 (H) 6 - 20 mg/dL   Creatinine, Ser 1.70 (H) 0.44 - 1.00 mg/dL   Calcium 8.3 (L) 8.9 - 10.3 mg/dL   GFR calc non Af Amer 31 (L) >60 mL/min   GFR calc Af Amer 36 (L) >60 mL/min    Comment: (NOTE) The eGFR has been calculated using the CKD EPI equation. This calculation has not been validated in all clinical situations. eGFR's persistently <60 mL/min signify possible Chronic Kidney Disease.    Anion gap 12 5 - 15  CBC     Status: Abnormal   Collection Time: 04/02/17  4:02 AM  Result Value Ref Range   WBC 38.9 (H)  4.0 - 10.5 K/uL    Comment: WHITE COUNT CONFIRMED ON SMEAR   RBC 3.60 (L) 3.87 - 5.11 MIL/uL   Hemoglobin 10.4 (L) 12.0 - 15.0 g/dL   HCT 31.4 (L) 36.0 - 46.0 %   MCV 87.2 78.0 - 100.0 fL  MCH 28.9 26.0 - 34.0 pg   MCHC 33.1 30.0 - 36.0 g/dL   RDW 16.3 (H) 11.5 - 15.5 %   Platelets 316 150 - 400 K/uL  Magnesium     Status: Abnormal   Collection Time: 04/02/17  4:02 AM  Result Value Ref Range   Magnesium 1.2 (L) 1.7 - 2.4 mg/dL    Imaging / Studies: Dg Abd 1 View  Result Date: 04/01/2017 CLINICAL DATA:  Endometrial cancer. EXAM: ABDOMEN - 1 VIEW; DG C-ARM 1-60 MIN-NO REPORT Radiation exposure index:  21.67 mGy. COMPARISON:  CT scan of January 27, 2017. FINDINGS: Six fluoroscopic images were obtained during cystoscopy. These images demonstrate exchange of right-sided ureteral stent which extends from right intrarenal collecting system to urinary bladder. IMPRESSION: Successful exchange of right-sided ureteral stent. Electronically Signed   By: Marijo Conception, M.D.   On: 04/01/2017 09:11   Portable Chest Xray  Result Date: 04/01/2017 CLINICAL DATA:  Acute respiratory failure EXAM: PORTABLE CHEST 1 VIEW COMPARISON:  08/25/2014 FINDINGS: Endotracheal tube tip is about 14 mm superior to the carina. Esophageal tube tip is below the diaphragm but not included on the image. Right-sided central venous catheter tip overlies the distal SVC. Hazy perihilar infiltrates. No effusion. No pneumothorax. IMPRESSION: 1. Endotracheal tube tip about 1.4 cm superior to carina 2. Hazy perihilar opacity may reflect atelectasis or mild perihilar infiltrates. Electronically Signed   By: Donavan Foil M.D.   On: 04/01/2017 23:04   Dg C-arm 1-60 Min-no Report  Result Date: 04/01/2017 Fluoroscopy was utilized by the requesting physician.  No radiographic interpretation.    Medications / Allergies: per chart  Antibiotics: Anti-infectives    Start     Dose/Rate Route Frequency Ordered Stop   04/02/17 0159   clindamycin (CLEOCIN) IVPB 900 mg     900 mg 100 mL/hr over 30 Minutes Intravenous Every 8 hours 04/02/17 0200 04/02/17 1359   04/01/17 2140  clindamycin (CLEOCIN) 900 mg, gentamicin (GARAMYCIN) 240 mg in sodium chloride 0.9 % 1,000 mL for intraperitoneal lavage  Status:  Discontinued       As needed 04/01/17 2141 04/01/17 2145   04/01/17 1915  clindamycin (CLEOCIN) IVPB 900 mg     900 mg 100 mL/hr over 30 Minutes Intravenous  Once 04/01/17 1912     04/01/17 0633  clindamycin (CLEOCIN) 900 MG/50ML IVPB    Comments:  [place], Ron   : cabinet override      04/01/17 0633 04/01/17 1844   04/01/17 0606  neomycin (MYCIFRADIN) tablet 1,000 mg  Status:  Discontinued     1,000 mg Oral 3 times per day 04/01/17 0606 04/01/17 0612   04/01/17 0606  metroNIDAZOLE (FLAGYL) tablet 1,000 mg  Status:  Discontinued     1,000 mg Oral 3 times per day 04/01/17 0606 04/01/17 0612   04/01/17 0600  clindamycin (CLEOCIN) 900 mg, gentamicin (GARAMYCIN) 240 mg in sodium chloride 0.9 % 1,000 mL for intraperitoneal lavage  Status:  Discontinued      Intraperitoneal To Surgery 03/31/17 1154 04/01/17 2158   04/01/17 0600  clindamycin (CLEOCIN) IVPB 900 mg     900 mg 100 mL/hr over 30 Minutes Intravenous 30 min pre-op 03/31/17 1157 04/01/17 2006   04/01/17 0600  gentamicin (GARAMYCIN) 340 mg in dextrose 5 % 100 mL IVPB     340 mg 217 mL/hr over 30 Minutes Intravenous 30 min pre-op 03/31/17 1157 04/01/17 0839        Note: Portions of this report may  have been transcribed using voice recognition software. Every effort was made to ensure accuracy; however, inadvertent computerized transcription errors may be present.   Any transcriptional errors that result from this process are unintentional.     [name], M.D., F.A.C.S. Gastrointestinal and Minimally Invasive Surgery [facility] [address] [place], [place] [address] [phone] Main / Paging   04/02/2017

## 2017-04-02 NOTE — Procedures (Signed)
Extubation Procedure Note  Patient Details:   Name: [name] DOB: 05-Jun-1956 MRN: [id number]   Airway Documentation:     Evaluation  O2 sats: 211 Complications: none Patient tolerated procedure well. Bilateral Breath Sounds: Diminished   Pt able to speak  Per CCM order, pt extubated and placed on nasal cannula. Tolerated well, no complications.   Martha Clan 04/02/2017, 5:50 PM

## 2017-04-03 DIAGNOSIS — Z9889 Other specified postprocedural states: Secondary | ICD-10-CM

## 2017-04-03 LAB — PREPARE RBC (CROSSMATCH)

## 2017-04-03 LAB — BASIC METABOLIC PANEL
ANION GAP: 6 (ref 5–15)
Anion gap: 8 (ref 5–15)
BUN: 35 mg/dL — AB (ref 6–20)
BUN: 35 mg/dL — AB (ref 6–20)
CALCIUM: 8 mg/dL — AB (ref 8.9–10.3)
CHLORIDE: 107 mmol/L (ref 101–111)
CO2: 20 mmol/L — AB (ref 22–32)
CO2: 22 mmol/L (ref 22–32)
CREATININE: 1.67 mg/dL — AB (ref 0.44–1.00)
Calcium: 8 mg/dL — ABNORMAL LOW (ref 8.9–10.3)
Chloride: 106 mmol/L (ref 101–111)
Creatinine, Ser: 1.67 mg/dL — ABNORMAL HIGH (ref 0.44–1.00)
GFR calc Af Amer: 37 mL/min — ABNORMAL LOW (ref >60)
GFR calc non Af Amer: 32 mL/min — ABNORMAL LOW (ref >60)
GFR calc non Af Amer: 32 mL/min — ABNORMAL LOW (ref >60)
GFR, EST AFRICAN AMERICAN: 37 mL/min — AB (ref >60)
GLUCOSE: 148 mg/dL — AB (ref 65–99)
Glucose, Bld: 148 mg/dL — ABNORMAL HIGH (ref 65–99)
POTASSIUM: 3.8 mmol/L (ref 3.5–5.1)
Potassium: 3.7 mmol/L (ref 3.5–5.1)
Sodium: 134 mmol/L — ABNORMAL LOW (ref 135–145)
Sodium: 135 mmol/L (ref 135–145)

## 2017-04-03 LAB — HEMOGLOBIN AND HEMATOCRIT, BLOOD
HEMATOCRIT: 21.9 % — AB (ref 36.0–46.0)
HEMOGLOBIN: 7.5 g/dL — AB (ref 12.0–15.0)

## 2017-04-03 LAB — GLUCOSE, CAPILLARY
GLUCOSE-CAPILLARY: 131 mg/dL — AB (ref 65–99)
GLUCOSE-CAPILLARY: 159 mg/dL — AB (ref 65–99)
Glucose-Capillary: 139 mg/dL — ABNORMAL HIGH (ref 65–99)
Glucose-Capillary: 146 mg/dL — ABNORMAL HIGH (ref 65–99)

## 2017-04-03 LAB — CBC WITH DIFFERENTIAL/PLATELET
BASOS ABS: 0 10*3/uL (ref 0.0–0.1)
Basophils Relative: 0 %
EOS PCT: 0 %
Eosinophils Absolute: 0 10*3/uL (ref 0.0–0.7)
HEMATOCRIT: 20.6 % — AB (ref 36.0–46.0)
Hemoglobin: 7 g/dL — ABNORMAL LOW (ref 12.0–15.0)
LYMPHS ABS: 0.9 10*3/uL (ref 0.7–4.0)
LYMPHS PCT: 4 %
MCH: 29.3 pg (ref 26.0–34.0)
MCHC: 34 g/dL (ref 30.0–36.0)
MCV: 86.2 fL (ref 78.0–100.0)
Monocytes Absolute: 1.3 10*3/uL — ABNORMAL HIGH (ref 0.1–1.0)
Monocytes Relative: 7 %
NEUTROS ABS: 17.3 10*3/uL — AB (ref 1.7–7.7)
NEUTROS PCT: 89 %
Platelets: 179 10*3/uL (ref 150–400)
RBC: 2.39 MIL/uL — AB (ref 3.87–5.11)
RDW: 16.7 % — ABNORMAL HIGH (ref 11.5–15.5)
WBC: 19.5 10*3/uL — AB (ref 4.0–10.5)

## 2017-04-03 LAB — MAGNESIUM: Magnesium: 2 mg/dL (ref 1.7–2.4)

## 2017-04-03 LAB — TRIGLYCERIDES: Triglycerides: 158 mg/dL — ABNORMAL HIGH (ref <150)

## 2017-04-03 MED ORDER — TBO-FILGRASTIM 480 MCG/0.8ML ~~LOC~~ SOSY
480.0000 ug | PREFILLED_SYRINGE | SUBCUTANEOUS | Status: DC
  Administered 2017-04-06: 480 ug via SUBCUTANEOUS
  Filled 2017-04-03: qty 0.8

## 2017-04-03 MED ORDER — TBO-FILGRASTIM 480 MCG/0.8ML ~~LOC~~ SOSY: 480.0000 ug | PREFILLED_SYRINGE | SUBCUTANEOUS | Status: DC

## 2017-04-03 MED ORDER — VITAMIN D3 25 MCG (1000 UNIT) PO TABS
10000.0000 [IU] | ORAL_TABLET | ORAL | Status: DC
  Filled 2017-04-03 (×2): qty 10

## 2017-04-03 MED ORDER — WITCH HAZEL-GLYCERIN EX PADS
1.0000 | MEDICATED_PAD | CUTANEOUS | Status: DC | PRN
Start: 2017-04-03 — End: 2017-04-07

## 2017-04-03 MED ORDER — CALCIUM CARBONATE-VITAMIN D 500-200 MG-UNIT PO TABS
1.0000 | ORAL_TABLET | Freq: Every day | ORAL | Status: DC
  Administered 2017-04-03 – 2017-04-07 (×5): 1 via ORAL
  Filled 2017-04-03 (×5): qty 1

## 2017-04-03 MED ORDER — ARTIFICIAL TEARS OPHTHALMIC OINT
TOPICAL_OINTMENT | Freq: Every day | OPHTHALMIC | Status: DC
  Administered 2017-04-03 – 2017-04-04 (×2): via OPHTHALMIC
  Administered 2017-04-05: 1 via OPHTHALMIC
  Administered 2017-04-06: 22:00:00 via OPHTHALMIC
  Filled 2017-04-03: qty 3.5

## 2017-04-03 MED ORDER — CHLORHEXIDINE GLUCONATE CLOTH 2 % EX PADS
6.0000 | MEDICATED_PAD | Freq: Every day | CUTANEOUS | Status: DC
  Administered 2017-04-03 – 2017-04-07 (×4): 6 via TOPICAL

## 2017-04-03 MED ORDER — BISMUTH SUBSALICYLATE 262 MG/15ML PO SUSP: 30.0000 mL | Freq: Three times a day (TID) | ORAL | Status: DC | PRN

## 2017-04-03 MED ORDER — PRENATAL MULTIVITAMIN CH
1.0000 | ORAL_TABLET | Freq: Every day | ORAL | Status: DC
  Administered 2017-04-03 – 2017-04-07 (×5): 1 via ORAL
  Filled 2017-04-03 (×5): qty 1

## 2017-04-03 MED ORDER — MAGNESIUM GLUCONATE 500 MG PO TABS
250.0000 mg | ORAL_TABLET | Freq: Every day | ORAL | Status: DC
  Administered 2017-04-03 – 2017-04-07 (×5): 250 mg via ORAL
  Filled 2017-04-03 (×5): qty 1

## 2017-04-03 MED ORDER — CALCIUM-MAGNESIUM-VITAMIN D 400-166.7-133.3 MG-MG-UNIT PO TABS: 1.0000 | ORAL_TABLET | Freq: Every day | ORAL | Status: DC

## 2017-04-03 MED ORDER — DIPHENHYDRAMINE HCL 25 MG PO CAPS: 25.0000 mg | ORAL_CAPSULE | Freq: Every evening | ORAL | Status: DC | PRN

## 2017-04-03 MED ORDER — ENSURE SURGERY PO LIQD
237.0000 mL | Freq: Two times a day (BID) | ORAL | Status: DC
  Filled 2017-04-03 (×2): qty 237

## 2017-04-03 MED ORDER — BIOTIN 5 MG PO TABS: 5.0000 mg | ORAL_TABLET | Freq: Every morning | ORAL | Status: DC

## 2017-04-03 MED ORDER — POLYETHYL GLYCOL-PROPYL GLYCOL 0.4-0.3 % OP GEL: Freq: Every day | OPHTHALMIC | Status: DC

## 2017-04-03 MED ORDER — HYDROMORPHONE HCL 1 MG/ML IJ SOLN
0.5000 mg | INTRAMUSCULAR | Status: DC | PRN
  Administered 2017-04-03 – 2017-04-04 (×7): 1 mg via INTRAVENOUS
  Filled 2017-04-03 (×6): qty 1

## 2017-04-03 MED ORDER — PREMIER PROTEIN SHAKE
11.0000 [oz_av] | Freq: Two times a day (BID) | ORAL | Status: DC
  Administered 2017-04-03 – 2017-04-05 (×4): 11 [oz_av] via ORAL
  Filled 2017-04-03 (×4): qty 325.31

## 2017-04-03 MED ORDER — SODIUM CHLORIDE 0.9 % IV SOLN
Freq: Once | INTRAVENOUS | Status: AC
  Administered 2017-04-03: 15:00:00 via INTRAVENOUS

## 2017-04-03 NOTE — Anesthesia Postprocedure Evaluation (Signed)
Anesthesia Post Note  Patient: Financial planner  Procedure(s) Performed: Procedure(s) (LRB): XI ROBOTIC VS LAPAROSCOPIC COLOSTOMY TAKEDOWN WITH LYSIS OF ADHESIONS. (N/A) RIDGE PROCTOSCOPY (N/A) XI ROBOTICALLY ASSISTED LAPAROSCOPIC URETERAL RE-IMPLANTATION (Right) CYSTOSCOPY WITH RETROGRADE PYELOGRAM, URETEROSCOPY AND STENT PLACEMENT (Right)     Patient location during evaluation: SICU Anesthesia Type: General Level of consciousness: sedated Pain management: pain level controlled Vital Signs Assessment: post-procedure vital signs reviewed and stable Respiratory status: patient remains intubated per anesthesia plan Cardiovascular status: stable Anesthetic complications: no    Last Vitals:  Vitals:   04/03/17 0900 04/03/17 1000  BP: (!) 136/47 (!) 130/45  Pulse:    Resp: 18 (!) 9  Temp:    SpO2: 98% 100%    Last Pain:  Vitals:   04/03/17 0903  TempSrc:   PainSc: 0-No pain                 [name] S

## 2017-04-03 NOTE — Progress Notes (Signed)
PHARMACIST - PHYSICIAN ORDER COMMUNICATION  CONCERNING: P&T Medication Policy on Herbal Medications  DESCRIPTION:  This patient's order for:  biotin  has been noted.  This product(s) is classified as an "herbal" or natural product. Due to a lack of definitive safety studies or FDA approval, nonstandard manufacturing practices, plus the potential risk of unknown drug-drug interactions while on inpatient medications, the Pharmacy and Therapeutics Committee does not permit the use of "herbal" or natural products of this type within [facility].   ACTION TAKEN: The pharmacy department is unable to verify this order at this time and your patient has been informed of this safety policy. Please reevaluate patient's clinical condition at discharge and address if the herbal or natural product(s) should be resumed at that time.   Doreene Eland, PharmD, BCPS.   Pager: [phone] 04/03/2017 8:00 AM

## 2017-04-03 NOTE — Progress Notes (Signed)
[name] [place], [place] [address] Phone: [phone]  FAX: [phone]      [name] [id number] [birth year]  CARE TEAM:  PCP: Ann Held, DO  Outpatient Care Team: Patient Care Team: Carollee Herter, Alferd Apa, DO as PCP - General (Family Medicine) Heath Lark, MD as Consulting Physician (Hematology and Oncology) Everitt Amber, MD as Consulting Physician (Obstetrics and Gynecology) Michael [name], MD as Consulting Physician (General Surgery) Alexis Frock, MD as Consulting Physician (Urology) Altheimer, Legrand Como, MD as Consulting Physician (Endocrinology) Katy Apo, MD as Consulting Physician (Ophthalmology) Stark Klein, MD as Consulting Physician (General Surgery) Irene Limbo, MD as Consulting Physician (Plastic Surgery)  Inpatient Treatment Team: Treatment Team: Attending Provider: Michael [name], MD; Consulting Physician: Alexis Frock, MD; Rounding Team: Pccm, Md, MD; Registered Nurse: Irven Baltimore, RN   Problem List:   Principal Problem:   Pelvic cancer s/p colostomy takedown/loop ileostomy diversion 04/01/2017 Active Problems:   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 s/p resection/partial vaginectomy/ LAR/colostomy 11/19/2014   Ureteral stricture, right, s/p resection & bladder hitch reimplantation 04/01/2017   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   Primary hypothyroidism   DM type 2 (diabetes mellitus, type 2) ([place])   Benign essential HTN   History of ovarian & endometrial cancer   Endotracheally intubated   Morbid obesity ([place])   Hypovolemic shock ([place])   2 Days Post-Op  04/01/2017  POST-OPERATIVE DIAGNOSIS:    Recurrent endometrial cancer status post low anterior resection and partial pelvic exeneration   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   SURGEON:  [name], MD- Primary (Dr Tresa Moore assist) PROCEDURE:   XI ROBOTIC LYSIS OF ADHESIONS x 5  hours XI ROBOTIC COLOSTOMY TAKEDOWN ROBOTIC SEWN COLOANAL ANASTOMOSIS Ileocecal resection with anastomosis Diverting loop ileostomy  SURGEON: Alexis Frock, MD - Primary (Dr Johney Maine assist) PROCEDURE:   Cystoscopy with Rt retrograde and stent exchange. Rt robotic ureteral reimplant with psoas hitch.    Assessment  Stabilizing.  Ileus reolving  Plan:  -adv pureed diet -follow Cr - inc but UOP OK -transfuse PRN Hgb <7 -glc control.  PO hypoglycemia & SSI backup -wean off IVF with PRN IVF bolus backup -leukocytosis prob from stress of OR, etc - follow.  Resume weekly Neupogen w h/o chronic neutropenia -VTE prophylaxis- SCDs, etc.  Resume lovenox if Hgb OK -mobilize as tolerated to help recovery -drain care - keep 10 days minimum -foley per Urology - suspect 2-3 weeks minimum w extensive surgery & poor tissues -stepdown status - prob up to floor tomorrow if BP stable off pressors  35 minutes spent in review, evaluation, examination, counseling, and coordination of care.  More than 50% of that time was spent in counseling.  I updated the patient's status to the patient and spouse.  Recommendations were made.  Questions were answered.  They expressed understanding & appreciation.   [name], M.D., F.A.C.S. Gastrointestinal and Minimally Invasive Surgery [facility] [address], [place], [place] [address] [phone] Main / Paging   04/03/2017    Subjective: (Chief complaint)  Extubated Came off pressors this morning Sitting in chair Tol clears Pain controlled Very appreciative of care ICU RN in room  Objective:  Vital signs:  Vitals:   04/03/17 0300 04/03/17 0352 04/03/17 0400 04/03/17 0500  BP: (!) 119/40  (!) 128/42 (!) 136/40  Pulse:      Resp: (!) 0  (!) 0 (!) 9  Temp:  97.6 F (36.4 C)    TempSrc:  Oral    SpO2: 100%  100% 100%  Weight:    101.3 kg (223 lb 5.2 oz)  Height:           Intake/Output    Yesterday:  09/01 0701 - 09/02 0700 In: 994.3 [I.V.:994.3] Out: 1410 [Urine:600; Drains:710; Stool:100] This shift:  No intake/output data recorded.  Bowel function:  Flatus: YES  BM:  YES - thin succus  Drain: Serosanguinous   Physical Exam:  General: Pt awake/alert/oriented x4 in no acute distress Eyes: PERRL, normal EOM.  Sclera clear.  No icterus Neuro: CN II-XII intact w/o focal sensory/motor deficits. Lymph: No head/neck/groin lymphadenopathy Psych:  No delerium/psychosis/paranoia HENT: Normocephalic, Mucus membranes moist.  No thrush.  ETT in place Neck: Supple, No tracheal deviation Chest: No chest wall pain w good excursion CV:  Pulses intact.  Regular rhythm MS: Normal AROM mjr joints.  No obvious deformity  Abdomen: Somewhat firm.  Mildy distended.  Mildly tender at incisions only.  No evidence of peritonitis.  No incarcerated hernias.  RUQ ileostomy mildly dusky.  ++gas, + liquid stool.  Mild flank bruising  Ext:  No deformity.  1+ BLE edema.  No cyanosis Skin: No petechiae / purpura  Results:   Labs: Results for orders placed or performed during the hospital encounter of 04/01/17 (from the past 48 hour(s))  POCT I-Stat EG7     Status: Abnormal   Collection Time: 04/01/17  2:23 PM  Result Value Ref Range   pH, Ven 7.308 7.250 - 7.430   pCO2, Ven 45.6 44.0 - 60.0 mmHg   pO2, Ven 42.0 32.0 - 45.0 mmHg   Bicarbonate 23.3 20.0 - 28.0 mmol/L   TCO2 25 22 - 32 mmol/L   O2 Saturation 78.0 %   Acid-base deficit 3.0 (H) 0.0 - 2.0 mmol/L   Sodium 139 135 - 145 mmol/L   Potassium 3.6 3.5 - 5.1 mmol/L   Calcium, Ion 1.16 1.15 - 1.40 mmol/L   HCT 23.0 (L) 36.0 - 46.0 %   Hemoglobin 7.8 (L) 12.0 - 15.0 g/dL   Patient temperature 35.2 C    Sample type VENOUS   POCT I-Stat EG7     Status: Abnormal   Collection Time: 04/01/17  2:30 PM  Result Value Ref Range   pH, Ven 7.325 7.250 - 7.430   pCO2, Ven 43.4 (L) 44.0 - 60.0 mmHg   pO2, Ven 44.0 32.0 - 45.0 mmHg    Bicarbonate 23.1 20.0 - 28.0 mmol/L   TCO2 25 22 - 32 mmol/L   O2 Saturation 81.0 %   Acid-base deficit 3.0 (H) 0.0 - 2.0 mmol/L   Sodium 139 135 - 145 mmol/L   Potassium 3.6 3.5 - 5.1 mmol/L   Calcium, Ion 1.17 1.15 - 1.40 mmol/L   HCT 23.0 (L) 36.0 - 46.0 %   Hemoglobin 7.8 (L) 12.0 - 15.0 g/dL   Patient temperature 35.2 C    Sample type VENOUS   Prepare RBC     Status: None   Collection Time: 04/01/17  3:00 PM  Result Value Ref Range   Order Confirmation ORDER PROCESSED BY BLOOD BANK   POCT I-Stat EG7     Status: Abnormal   Collection Time: 04/01/17  4:23 PM  Result Value Ref Range   pH, Ven 7.272 7.250 - 7.430   pCO2, Ven 44.6 44.0 - 60.0 mmHg   pO2, Ven 56.0 (H) 32.0 - 45.0 mmHg  Bicarbonate 21.1 20.0 - 28.0 mmol/L   TCO2 23 22 - 32 mmol/L   O2 Saturation 88.0 %   Acid-base deficit 6.0 (H) 0.0 - 2.0 mmol/L   Sodium 139 135 - 145 mmol/L   Potassium 4.0 3.5 - 5.1 mmol/L   Calcium, Ion 1.13 (L) 1.15 - 1.40 mmol/L   HCT 29.0 (L) 36.0 - 46.0 %   Hemoglobin 9.9 (L) 12.0 - 15.0 g/dL   Patient temperature 35.0 C    Sample type VENOUS   Prepare RBC     Status: None   Collection Time: 04/01/17  7:21 PM  Result Value Ref Range   Order Confirmation ORDER PROCESSED BY BLOOD BANK   CBC     Status: Abnormal   Collection Time: 04/01/17  8:00 PM  Result Value Ref Range   WBC 22.6 (H) 4.0 - 10.5 K/uL   RBC 3.46 (L) 3.87 - 5.11 MIL/uL   Hemoglobin 10.0 (L) 12.0 - 15.0 g/dL   HCT 29.7 (L) 36.0 - 46.0 %   MCV 85.8 78.0 - 100.0 fL   MCH 28.9 26.0 - 34.0 pg   MCHC 33.7 30.0 - 36.0 g/dL   RDW 15.7 (H) 11.5 - 15.5 %   Platelets 240 150 - 400 K/uL  Basic metabolic panel     Status: Abnormal   Collection Time: 04/01/17 10:58 PM  Result Value Ref Range   Sodium 139 135 - 145 mmol/L   Potassium 4.4 3.5 - 5.1 mmol/L   Chloride 108 101 - 111 mmol/L   CO2 17 (L) 22 - 32 mmol/L   Glucose, Bld 202 (H) 65 - 99 mg/dL   BUN 21 (H) 6 - 20 mg/dL   Creatinine, Ser 1.36 (H) 0.44 - 1.00 mg/dL    Calcium 8.7 (L) 8.9 - 10.3 mg/dL   GFR calc non Af Amer 41 (L) >60 mL/min   GFR calc Af Amer 47 (L) >60 mL/min    Comment: (NOTE) The eGFR has been calculated using the CKD EPI equation. This calculation has not been validated in all clinical situations. eGFR's persistently <60 mL/min signify possible Chronic Kidney Disease.    Anion gap 14 5 - 15  Magnesium     Status: Abnormal   Collection Time: 04/01/17 10:58 PM  Result Value Ref Range   Magnesium 1.2 (L) 1.7 - 2.4 mg/dL  Phosphorus     Status: Abnormal   Collection Time: 04/01/17 10:58 PM  Result Value Ref Range   Phosphorus 7.4 (H) 2.5 - 4.6 mg/dL  Draw ABG 1 hour after initiation of ventilator     Status: Abnormal   Collection Time: 04/01/17 11:50 PM  Result Value Ref Range   FIO2 60.00    Delivery systems VENTILATOR    Mode PRESSURE REGULATED VOLUME CONTROL    VT 410 mL   LHR 15 resp/min   Peep/cpap 5.0 cm H20   pH, Arterial 7.265 (L) 7.350 - 7.450   pCO2 arterial 38.6 32.0 - 48.0 mmHg   pO2, Arterial 208 (H) 83.0 - 108.0 mmHg   Bicarbonate 17.8 (L) 20.0 - 28.0 mmol/L   Acid-base deficit 9.1 (H) 0.0 - 2.0 mmol/L   O2 Saturation 99.1 %   Patient temperature 93.2    Collection site RIGHT RADIAL    Drawn by [id number]    Sample type ARTERIAL DRAW    Allens test (pass/fail) PASS PASS  MRSA PCR Screening     Status: None   Collection Time: 04/02/17  2:02 AM  Result Value Ref Range   MRSA by PCR NEGATIVE NEGATIVE    Comment:        The GeneXpert MRSA Assay (FDA approved for NASAL specimens only), is one component of a comprehensive MRSA colonization surveillance program. It is not intended to diagnose MRSA infection nor to guide or monitor treatment for MRSA infections.   Basic metabolic panel     Status: Abnormal   Collection Time: 04/02/17  4:02 AM  Result Value Ref Range   Sodium 140 135 - 145 mmol/L   Potassium 4.6 3.5 - 5.1 mmol/L   Chloride 109 101 - 111 mmol/L   CO2 19 (L) 22 - 32 mmol/L   Glucose, Bld  177 (H) 65 - 99 mg/dL   BUN 23 (H) 6 - 20 mg/dL   Creatinine, Ser 1.70 (H) 0.44 - 1.00 mg/dL   Calcium 8.3 (L) 8.9 - 10.3 mg/dL   GFR calc non Af Amer 31 (L) >60 mL/min   GFR calc Af Amer 36 (L) >60 mL/min    Comment: (NOTE) The eGFR has been calculated using the CKD EPI equation. This calculation has not been validated in all clinical situations. eGFR's persistently <60 mL/min signify possible Chronic Kidney Disease.    Anion gap 12 5 - 15  CBC     Status: Abnormal   Collection Time: 04/02/17  4:02 AM  Result Value Ref Range   WBC 38.9 (H) 4.0 - 10.5 K/uL    Comment: WHITE COUNT CONFIRMED ON SMEAR   RBC 3.60 (L) 3.87 - 5.11 MIL/uL   Hemoglobin 10.4 (L) 12.0 - 15.0 g/dL   HCT 31.4 (L) 36.0 - 46.0 %   MCV 87.2 78.0 - 100.0 fL   MCH 28.9 26.0 - 34.0 pg   MCHC 33.1 30.0 - 36.0 g/dL   RDW 16.3 (H) 11.5 - 15.5 %   Platelets 316 150 - 400 K/uL  Magnesium     Status: Abnormal   Collection Time: 04/02/17  4:02 AM  Result Value Ref Range   Magnesium 1.2 (L) 1.7 - 2.4 mg/dL  Glucose, capillary     Status: Abnormal   Collection Time: 04/02/17  8:00 AM  Result Value Ref Range   Glucose-Capillary 151 (H) 65 - 99 mg/dL  Glucose, capillary     Status: Abnormal   Collection Time: 04/02/17 11:33 AM  Result Value Ref Range   Glucose-Capillary 155 (H) 65 - 99 mg/dL  Glucose, capillary     Status: Abnormal   Collection Time: 04/02/17  5:33 PM  Result Value Ref Range   Glucose-Capillary 110 (H) 65 - 99 mg/dL  Glucose, capillary     Status: Abnormal   Collection Time: 04/02/17  9:12 PM  Result Value Ref Range   Glucose-Capillary 142 (H) 65 - 99 mg/dL   Comment 1 Notify RN    Comment 2 Document in Chart     Imaging / Studies: Dg Abd 1 View  Result Date: 04/02/2017 CLINICAL DATA:  OG placement EXAM: ABDOMEN - 1 VIEW COMPARISON:  Earlier films of same day FINDINGS: Orogastric tube extends to the gastric body, stomach decompressed. Normal bowel gas pattern. Stable right double-J ureteral  stent. Drain catheter loops over the lower abdomen. Cholecystectomy clips. Multiple surgical clips project over the sacrum. No abnormal abdominal calcifications. Regional bones unremarkable. IMPRESSION: 1. Stable position of orogastric tube, in the gastric body Electronically Signed   By: Lucrezia Europe M.D.   On: 04/02/2017 11:12   Dg Abd 1 View  Result Date: 04/02/2017 CLINICAL DATA:  Orogastric tube placement EXAM: ABDOMEN - 1 VIEW COMPARISON:  Earlier film of the same day FINDINGS: Orogastric tube extends to the body of the decompressed stomach. Normal bowel gas pattern. Stable right double-J ureteral stent. Drain catheter projects over the lower abdomen. Cholecystectomy clips. Multiple surgical clips project over the sacrum. No abnormal abdominal calcifications. Regional bones unremarkable. IMPRESSION: 1. Orogastric tube to the body of the stomach. Electronically Signed   By: Lucrezia Europe M.D.   On: 04/02/2017 11:11   Dg Abd 1 View  Result Date: 04/01/2017 CLINICAL DATA:  Endometrial cancer. EXAM: ABDOMEN - 1 VIEW; DG C-ARM 1-60 MIN-NO REPORT Radiation exposure index:  21.67 mGy. COMPARISON:  CT scan of January 27, 2017. FINDINGS: Six fluoroscopic images were obtained during cystoscopy. These images demonstrate exchange of right-sided ureteral stent which extends from right intrarenal collecting system to urinary bladder. IMPRESSION: Successful exchange of right-sided ureteral stent. Electronically Signed   By: Marijo Conception, M.D.   On: 04/01/2017 09:11   Portable Chest Xray  Result Date: 04/01/2017 CLINICAL DATA:  Acute respiratory failure EXAM: PORTABLE CHEST 1 VIEW COMPARISON:  08/25/2014 FINDINGS: Endotracheal tube tip is about 14 mm superior to the carina. Esophageal tube tip is below the diaphragm but not included on the image. Right-sided central venous catheter tip overlies the distal SVC. Hazy perihilar infiltrates. No effusion. No pneumothorax. IMPRESSION: 1. Endotracheal tube tip about 1.4 cm  superior to carina 2. Hazy perihilar opacity may reflect atelectasis or mild perihilar infiltrates. Electronically Signed   By: Donavan Foil M.D.   On: 04/01/2017 23:04   Dg Abd Portable 1v  Result Date: 04/02/2017 CLINICAL DATA:  OG tube placement EXAM: PORTABLE ABDOMEN - 1 VIEW COMPARISON:  None available FINDINGS: OG tube within the collapsed stomach body in the left abdomen. Right ureteral stent in place. Jejunal feeding tube over the lower abdomen and pelvis. Nonobstructive bowel gas pattern. IMPRESSION: OG tube within the stomach. Electronically Signed   By: Jerilynn Mages.  Shick M.D.   On: 04/02/2017 10:26   Dg C-arm 1-60 Min-no Report  Result Date: 04/01/2017 Fluoroscopy was utilized by the requesting physician.  No radiographic interpretation.    Medications / Allergies: per chart  Antibiotics: Anti-infectives    Start     Dose/Rate Route Frequency Ordered Stop   04/02/17 0159  clindamycin (CLEOCIN) IVPB 900 mg     900 mg 100 mL/hr over 30 Minutes Intravenous Every 8 hours 04/02/17 0200 04/02/17 1019   04/01/17 2140  clindamycin (CLEOCIN) 900 mg, gentamicin (GARAMYCIN) 240 mg in sodium chloride 0.9 % 1,000 mL for intraperitoneal lavage  Status:  Discontinued       As needed 04/01/17 2141 04/01/17 2145   04/01/17 1915  clindamycin (CLEOCIN) IVPB 900 mg  Status:  Discontinued     900 mg 100 mL/hr over 30 Minutes Intravenous  Once 04/01/17 1912 04/02/17 0732   04/01/17 0633  clindamycin (CLEOCIN) 900 MG/50ML IVPB    Comments:  [place] [facility]   : cabinet override      04/01/17 0633 04/01/17 1844   04/01/17 0606  neomycin (MYCIFRADIN) tablet 1,000 mg  Status:  Discontinued     1,000 mg Oral 3 times per day 04/01/17 0606 04/01/17 0612   04/01/17 0606  metroNIDAZOLE (FLAGYL) tablet 1,000 mg  Status:  Discontinued     1,000 mg Oral 3 times per day 04/01/17 0606 04/01/17 0612   04/01/17 0600  clindamycin (CLEOCIN) 900 mg, gentamicin (GARAMYCIN) 240  mg in sodium chloride 0.9 % 1,000 mL for  intraperitoneal lavage  Status:  Discontinued      Intraperitoneal To Surgery 03/31/17 1154 04/01/17 2158   04/01/17 0600  clindamycin (CLEOCIN) IVPB 900 mg     900 mg 100 mL/hr over 30 Minutes Intravenous 30 min pre-op 03/31/17 1157 04/01/17 2006   04/01/17 0600  gentamicin (GARAMYCIN) 340 mg in dextrose 5 % 100 mL IVPB     340 mg 217 mL/hr over 30 Minutes Intravenous 30 min pre-op 03/31/17 1157 04/01/17 0839        Note: Portions of this report may have been transcribed using voice recognition software. Every effort was made to ensure accuracy; however, inadvertent computerized transcription errors may be present.   Any transcriptional errors that result from this process are unintentional.     [name], M.D., F.A.C.S. Gastrointestinal and Minimally Invasive Surgery [facility] [address], [place], [place] [address] [phone] Main / Paging   04/03/2017

## 2017-04-03 NOTE — Progress Notes (Signed)
CRITICAL VALUE ALERT  Critical Value:  Hemoglobin 5.7  Date & Time Notied:  04/03/2017 1030  Provider Notified: Dr. Vaughan Browner  Orders Received/Actions taken: PRBC ordered.

## 2017-04-03 NOTE — Progress Notes (Signed)
Urology Inpatient Progress Report  recurrent endometrial cancer status post low anterior resection and  partial exoneration  Desire for colostomy takedown  Procedure(s): XI ROBOTIC VS LAPAROSCOPIC COLOSTOMY TAKEDOWN WITH LYSIS OF ADHESIONS. RIDGE PROCTOSCOPY XI ROBOTICALLY ASSISTED LAPAROSCOPIC URETERAL RE-IMPLANTATION CYSTOSCOPY WITH RETROGRADE PYELOGRAM, URETEROSCOPY AND STENT PLACEMENT  2 Days Post-Op   Intv/Subj: Pt extubated. JP with less output. Urine clear  Principal Problem:   Pelvic cancer s/p colostomy takedown/loop ileostomy diversion 04/01/2017 Active Problems:   Primary hypothyroidism   DM type 2 (diabetes mellitus, type 2) (HCC)   Benign essential HTN   History of ovarian & endometrial cancer   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 s/p resection/partial vaginectomy/ LAR/colostomy 11/19/2014   Endotracheally intubated   Morbid obesity ([place])   Pancytopenia, acquired ([place])   Breast cancer of upper-inner quadrant of left female breast ([place])   Ureteral stricture, right, s/p resection & bladder hitch reimplantation 04/01/2017   Vitamin D insufficiency   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 acetaminophen (TYLENOL) tablet 1,000 mg  1,000 mg Oral TID Michael [name], MD   1,000 mg at 04/03/17 0804  . alum & mag hydroxide-simeth (MAALOX/MYLANTA) 200-200-20 MG/5ML suspension 30 mL  30 mL Oral Q6H PRN Michael [name], MD      . alvimopan (ENTEREG) capsule 12 mg  12 mg Oral BID Michael [name], MD   12 mg at 04/03/17 0804  . anastrozole (ARIMIDEX) tablet 1 mg  1 mg Oral Daily Michael [name], MD   1 mg at 04/03/17 0805  . artificial tears (LACRILUBE) ophthalmic ointment   Both Eyes QHS Berton Mount, RPH      . aspirin EC tablet 81 mg  81 mg Oral Daily Michael [name], MD   81 mg at 04/03/17 0803  . bismuth subsalicylate (PEPTO BISMOL) 262 MG/15ML suspension 30 mL  30 mL Oral  Q8H PRN Michael [name], MD      . calcium-vitamin D (OSCAL WITH D) 500-200 MG-UNIT per tablet 1 tablet  1 tablet Oral Daily Berton Mount, RPH   1 tablet at 04/03/17 0816   And  . magnesium gluconate (MAGONATE) tablet 250 mg  250 mg Oral Daily Berton Mount, RPH      . chlorhexidine gluconate (MEDLINE KIT) (PERIDEX) 0.12 % solution 15 mL  15 mL Mouth Rinse BID Michael [name], MD   15 mL at 04/03/17 7902  . Chlorhexidine Gluconate Cloth 2 % PADS 6 each  6 each Topical Q0600 Michael [name], MD      . cholecalciferol (VITAMIN D) tablet 10,000 Units  10,000 Units Oral Weekly Michael [name], MD      . diphenhydrAMINE (BENADRYL) 12.5 MG/5ML elixir 12.5 mg  12.5 mg Oral Q6H PRN Michael [name], MD       Or  . diphenhydrAMINE (BENADRYL) injection 12.5 mg  12.5 mg Intravenous Q6H PRN Michael [name], MD      . diphenhydrAMINE (BENADRYL) capsule 25 mg  25 mg Oral QHS PRN Michael [name], MD      . enoxaparin (LOVENOX) injection 40 mg  40 mg Subcutaneous QHS Michael [name], MD      . feeding supplement (ENSURE SURGERY) liquid 237 mL  237 mL Oral BID BM Michael [name], MD      . fentaNYL (SUBLIMAZE) bolus via infusion 50 mcg  50 mcg Intravenous Q1H PRN Rigoberto Noel, MD   50 mcg at 04/02/17 0615  . gabapentin (  NEURONTIN) capsule 300 mg  300 mg Oral BID Michael [name], MD   300 mg at 04/03/17 0803  . guaiFENesin-dextromethorphan (ROBITUSSIN DM) 100-10 MG/5ML syrup 10 mL  10 mL Oral Q4H PRN Michael [name], MD      . hydrALAZINE (APRESOLINE) injection 5-20 mg  5-20 mg Intravenous Q6H PRN Michael [name], MD      . hydrocortisone (ANUSOL-HC) 2.5 % rectal cream 1 application  1 application Topical QID PRN Michael [name], MD      . hydrocortisone cream 1 % 1 application  1 application Topical TID PRN Michael [name], MD      . HYDROmorphone (DILAUDID) injection 0.5-2 mg  0.5-2 mg Intravenous Q1H PRN Michael [name], MD   1 mg at 04/03/17 0800  . insulin aspart (novoLOG) injection 0-15 Units  0-15 Units Subcutaneous TID WC  Michael [name], MD   2 Units at 04/03/17 [phone]  . insulin aspart (novoLOG) injection 0-5 Units  0-5 Units Subcutaneous QHS Michael [name], MD      . lactated ringers infusion 1,000 mL  1,000 mL Intravenous Q8H PRN Gross, Remo Lipps, MD      . levothyroxine (SYNTHROID, LEVOTHROID) tablet 150 mcg  150 mcg Oral QAC breakfast Michael [name], MD   150 mcg at 04/03/17 0804  . linagliptin (TRADJENTA) tablet 5 mg  5 mg Oral Daily Michael [name], MD   5 mg at 04/03/17 0803  . lip balm (CARMEX) ointment 1 application  1 application Topical BID Michael [name], MD   1 application at 02/40/97 [phone]  . loratadine (CLARITIN) tablet 10 mg  10 mg Oral Daily Michael [name], MD   10 mg at 04/03/17 0804  . magic mouthwash  15 mL Oral QID PRN Michael [name], MD      . MEDLINE mouth rinse  15 mL Mouth Rinse QID Michael [name], MD   15 mL at 04/02/17 1720  . menthol-cetylpyridinium (CEPACOL) lozenge 3 mg  1 lozenge Oral PRN Michael [name], MD      . methocarbamol (ROBAXIN) 1,000 mg in dextrose 5 % 50 mL IVPB  1,000 mg Intravenous Q6H PRN Michael [name], MD      . metoprolol tartrate (LOPRESSOR) injection 5 mg  5 mg Intravenous Q6H PRN Michael [name], MD      . ondansetron [facility]) tablet 4 mg  4 mg Oral Q6H PRN Michael [name], MD       Or  . ondansetron (ZOFRAN) injection 4 mg  4 mg Intravenous Q6H PRN Michael [name], MD      . phenol (CHLORASEPTIC) mouth spray 1-2 spray  1-2 spray Mouth/Throat PRN Michael [name], MD      . phenylephrine (NEO-SYNEPHRINE) 40 mg in sodium chloride 0.9 % 250 mL (0.16 mg/mL) infusion  0-200 mcg/min Intravenous Titrated Berton Mount, RPH   Stopped at 04/03/17 0500  . prenatal multivitamin tablet 1 tablet  1 tablet Oral Daily Michael [name], MD      . prochlorperazine (COMPAZINE) injection 5-10 mg  5-10 mg Intravenous Q4H PRN Michael [name], MD      . rosuvastatin (CRESTOR) tablet 10 mg  10 mg Oral QPM Michael [name], MD      . saccharomyces boulardii (FLORASTOR) capsule 250 mg  250 mg Oral BID Michael [name],  MD   250 mg at 04/03/17 0803  . [START ON 04/06/2017] Tbo-Filgrastim (GRANIX) injection 480 mcg  480 mcg Subcutaneous Once every 6 days Berton Mount, RPH      . witch hazel-glycerin (TUCKS) pad 1 application  1 application Topical PRN Michael [name], MD         Objective: Vital: Vitals:   04/03/17 0702 04/03/17 0758 04/03/17 0800 04/03/17 0900  BP: (!) 123/42 (!) 135/41  (!) 136/47  Pulse:      Resp: (!) 22 (!) 24  18  Temp:   97.6 F (36.4 C)   TempSrc:   Axillary   SpO2: 100% 100%  98%  Weight:      Height:       I/Os: I/O last 3 completed shifts: In: 3216.2 [I.V.:3216.2] Out: 2360 [Urine:800; Drains:1160; Stool:100; Blood:300]  Physical Exam:  General: Patient is in no apparent distress Lungs: Normal respiratory effort, chest expands symmetrically. GI: Incisions are c/d/i.  The abdomen is soft and appropriately tender JP drain with serosanguinous drainage Foley: clear urine Ext: lower extremities symmetric  Lab Results:  Recent Labs  04/01/17 1623 04/01/17 2000 04/02/17 0402  WBC  --  22.6* 38.9*  HGB 9.9* 10.0* 10.4*  HCT 29.0* 29.7* 31.4*    Recent Labs  04/01/17 1623 04/01/17 2258 04/02/17 0402  NA 139 139 140  K 4.0 4.4 4.6  CL  --  108 109  CO2  --  17* 19*  GLUCOSE  --  202* 177*  BUN  --  21* 23*  CREATININE  --  1.36* 1.70*  CALCIUM  --  8.7* 8.3*   No results for input(s): LABPT, INR in the last 72 hours. No results for input(s): LABURIN in the last 72 hours. Results for orders placed or performed during the hospital encounter of 04/01/17  MRSA PCR Screening     Status: None   Collection Time: 04/02/17  2:02 AM  Result Value Ref Range Status   MRSA by PCR NEGATIVE NEGATIVE Final    Comment:        The GeneXpert MRSA Assay (FDA approved for NASAL specimens only), is one component of a comprehensive MRSA colonization surveillance program. It is not intended to diagnose MRSA infection nor to guide or monitor treatment for MRSA  infections.     Studies/Results: Dg Abd 1 View  Result Date: 04/02/2017 CLINICAL DATA:  OG placement EXAM: ABDOMEN - 1 VIEW COMPARISON:  Earlier films of same day FINDINGS: Orogastric tube extends to the gastric body, stomach decompressed. Normal bowel gas pattern. Stable right double-J ureteral stent. Drain catheter loops over the lower abdomen. Cholecystectomy clips. Multiple surgical clips project over the sacrum. No abnormal abdominal calcifications. Regional bones unremarkable. IMPRESSION: 1. Stable position of orogastric tube, in the gastric body Electronically Signed   By: Lucrezia Europe M.D.   On: 04/02/2017 11:12   Dg Abd 1 View  Result Date: 04/02/2017 CLINICAL DATA:  Orogastric tube placement EXAM: ABDOMEN - 1 VIEW COMPARISON:  Earlier film of the same day FINDINGS: Orogastric tube extends to the body of the decompressed stomach. Normal bowel gas pattern. Stable right double-J ureteral stent. Drain catheter projects over the lower abdomen. Cholecystectomy clips. Multiple surgical clips project over the sacrum. No abnormal abdominal calcifications. Regional bones unremarkable. IMPRESSION: 1. Orogastric tube to the body of the stomach. Electronically Signed   By: Lucrezia Europe M.D.   On: 04/02/2017 11:11   Portable Chest Xray  Result Date: 04/01/2017 CLINICAL DATA:  Acute respiratory failure EXAM: PORTABLE CHEST 1 VIEW COMPARISON:  08/25/2014 FINDINGS: Endotracheal tube tip is about 14 mm superior to the carina. Esophageal tube tip is below the diaphragm but not included on the image. Right-sided central venous catheter tip overlies  the distal SVC. Hazy perihilar infiltrates. No effusion. No pneumothorax. IMPRESSION: 1. Endotracheal tube tip about 1.4 cm superior to carina 2. Hazy perihilar opacity may reflect atelectasis or mild perihilar infiltrates. Electronically Signed   By: Donavan Foil M.D.   On: 04/01/2017 23:04   Dg Abd Portable 1v  Result Date: 04/02/2017 CLINICAL DATA:  OG tube  placement EXAM: PORTABLE ABDOMEN - 1 VIEW COMPARISON:  None available FINDINGS: OG tube within the collapsed stomach body in the left abdomen. Right ureteral stent in place. Jejunal feeding tube over the lower abdomen and pelvis. Nonobstructive bowel gas pattern. IMPRESSION: OG tube within the stomach. Electronically Signed   By: Jerilynn Mages.  Shick M.D.   On: 04/02/2017 10:26    Assessment: Procedure(s): XI ROBOTIC VS LAPAROSCOPIC COLOSTOMY TAKEDOWN WITH LYSIS OF ADHESIONS. RIDGE PROCTOSCOPY XI ROBOTICALLY ASSISTED LAPAROSCOPIC URETERAL RE-IMPLANTATION CYSTOSCOPY WITH RETROGRADE PYELOGRAM, URETEROSCOPY AND STENT PLACEMENT, 2 Days Post-Op  doing well. Foley draining clear urine, JP output fairly high, don't suspect urine leak at this time, but will continue to monitor.   Plan: Routine care per surgery Will continue to follow.   Nicolette Bang

## 2017-04-03 NOTE — Progress Notes (Signed)
PULMONARY / CRITICAL CARE MEDICINE   Name: [name] MRN: [id number] DOB: Jun 06, 1956    ADMISSION DATE:  04/01/2017 CONSULTATION DATE:  8/31 REFERRING MD:  Johney Maine CHIEF COMPLAINT:  Intubated post op  HISTORY OF PRESENT ILLNESS:   61 yr old with metastatic endometrial cancer, very complicated surgical history went today for a very long surgery (12hr) XI ROBOTIC LYSIS OF ADHESIONS x 5 hours, XI ROBOTIC COLOSTOMY TAKEDOWN, ROBOTIC SEWN COLOANAL ANASTOMOSIS, Ileocecal resection with anastomosis, Diverting loop ileostomy,  Cystoscopy with Rt retrograde and stent exchange. Rt robotic ureteral reimplant with psoas hitch.   Patient came from the OR intubated.  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 breast ([place]); Breast cancer, left [facility]) (dx 10-30-2015  oncologist-  dr Heath Lark); Cancer of corpus uteri, except isthmus [facility]) (dx 10-15-2004 oncologist-- dr Denman [name] and dr Alvy Bimler ); Cellulitis of left abdominal wall near colostomy (12/03/2014); Chronic idiopathic neutropenia ([place]); Complication of anesthesia; Diabetic retinopathy, background ([place]); DM type 2 (diabetes mellitus, type 2) ([place]); Dysuria; GERD (gastroesophageal reflux disease); Hiatal hernia; History of bronchitis; History of gastric polyp; History of radiation therapy; Hypothyroidism; Mixed dyslipidemia; Multiple thyroid nodules; PONV (postoperative nausea and vomiting); Radiation-induced dermatitis; Right flank pain; S/P colostomy ([place]); Seasonal allergies; Shoulder stiffness; Ureteral stricture, right (UROLOGIT-  DR [facility]); Vitamin D deficiency; and Wears glasses.  PAST SURGICAL HISTORY: She  has a past surgical history that includes Appendectomy; Tonsillectomy; Colonoscopy with propofol (N/A, 08/21/2013); EUS (N/A, 10/02/2014); Robotic assisted lap vaginal hysterectomy  (N/A, 11/19/2014); Ostomy (N/A, 11/19/2014); Colostomy takedown (N/A, 12/04/2014); Cystoscopy with retrograde pyelogram, ureteroscopy and stent placement (Right, 03/20/2015); EXCISION SOFT TISSUE MASS RIGHT FOREMAN (12-08-2006); Cystoscopy with retrograde pyelogram, ureteroscopy and stent placement (Right, 05/02/2015); Cystoscopy with retrograde pyelogram, ureteroscopy and stent placement (Right, 09/05/2015); Cystoscopy w/ retrogrades (Right, 11/21/2015); Cystoscopy w/ ureteral stent placement (Right, 11/21/2015); Mastectomy (Right, 12/11/2015); Mastectomy complete / simple w/ sentinel node biopsy (Left, 12/11/2015); Mastectomy w/ sentinel node biopsy (Bilateral, 12/11/2015); Breast reconstruction with placement of tissue expander and flex hd (acellular hydrated dermis) (Bilateral, 12/11/2015); Incision and drainage of wound (Bilateral, 12/26/2015); Tissue expander filling (Bilateral, 12/26/2015); Laparoscopic cholecystectomy (1990); Eye surgery (as child); Total abdominal hysterectomy (March 2006   [facility]); Cystoscopy w/ ureteral stent placement (Right, 03/10/2016); Removal of bilateral tissue expanders with placement of bilateral breast implants (Bilateral, 04/16/2016); Liposuction with lipofilling (Bilateral, 04/16/2016); Cystoscopy w/ ureteral stent placement (Right, 06/30/2016); and Cystoscopy with stent placement (Right, 10/27/2016).  Allergies  Allergen Reactions  . Penicillins Swelling    Facial swelling Has patient had a PCN reaction causing immediate rash, facial/tongue/throat swelling, SOB or lightheadedness with hypotension: Yes Has patient had a PCN reaction causing severe rash involving mucus membranes or skin necrosis: Yes Has patient had a PCN reaction that required hospitalization No Has patient had a PCN reaction occurring within the last 10 years: No If all of the above answers are "NO", then may proceed with Cephalosporin use.   [name] Kitchen Ultram [Tramadol] Hives  . Adhesive [Tape]     blisters  . Cefaclor  Rash    Ceclor  . Erythromycin     Gastritis, abd cramps  . Trimethoprim Rash  . Ciprofloxacin Other (See Comments)    Unknown On Dr notes   . Pectin Rash    Pectin ring for stoma  . Sulfa Antibiotics Rash    No current facility-administered  medications on file prior to encounter.    Current Outpatient Prescriptions on File Prior to Encounter  Medication Sig  . anastrozole (ARIMIDEX) 1 MG tablet TAKE 1 TABLET DAILY  . Biotin 5 MG TABS Take 5 mg by mouth every morning.   . Calcium-Magnesium-Vitamin D 400-166.7-133.3 MG-MG-UNIT TABS Take 1 tablet by mouth daily.   . Cholecalciferol (VITAMIN D3) 10000 UNITS capsule Take 10,000 Units by mouth once a week. Sunday evening's  . diphenhydrAMINE (BENADRYL) 25 MG tablet Take 25 mg by mouth at bedtime as needed for itching or sleep. Reported on 09/15/2015  . filgrastim (NEUPOGEN) 480 MCG/1.6ML injection Inject 1.6 ml under the skin every 5 days for life (Patient taking differently: Inject 480 mcg into the skin See admin instructions. Inject 1.6 ml under the skin every 6 days for life)  . levothyroxine (SYNTHROID, LEVOTHROID) 150 MCG tablet Take 150 mcg by mouth daily.  [name] Kitchen loratadine (CLARITIN) 10 MG tablet Take 10 mg by mouth daily.   . metFORMIN (GLUCOPHAGE) 1000 MG tablet Take 1,000 mg by mouth 2 (two) times daily with a meal.   . Polyethyl Glycol-Propyl Glycol (SYSTANE OP) Place 1 drop into both eyes at bedtime.   . Prenatal Vit-Fe Fumarate-FA (PRENATAL VITAMIN PO) Take 1 capsule by mouth daily. Takes prenatal because there are no dyes in it  . rosuvastatin (CRESTOR) 10 MG tablet Take 10 mg by mouth every evening.   . sitaGLIPtin (JANUVIA) 100 MG tablet Take 100 mg by mouth every morning.   [name] Kitchen omega-3 acid ethyl esters (LOVAZA) 1 G capsule Take 1 g by mouth 2 (two) times daily.    FAMILY HISTORY:  Her indicated that her mother is deceased. She indicated that her father is alive. She indicated that the status of her sister is unknown. She  indicated that the status of her brother is unknown. She indicated that her maternal grandmother is deceased. She indicated that her maternal grandfather is deceased. She indicated that her paternal grandmother is deceased. She indicated that her paternal grandfather is deceased. She indicated that both of her maternal aunts are deceased. She indicated that her paternal aunt is deceased. She indicated that her other is deceased.    SOCIAL HISTORY: She  reports that she has never smoked. She has never used smokeless tobacco. She reports that she drinks alcohol.  REVIEW OF SYSTEMS:   All negative; except for those that are bolded, which indicate positives.  Constitutional: weight loss, weight gain, night sweats, fevers, chills, fatigue, weakness.  HEENT: headaches, sore throat, sneezing, nasal congestion, post nasal drip, difficulty swallowing, tooth/dental problems, visual complaints, visual changes, ear aches. Neuro: difficulty with speech, weakness, numbness, ataxia. CV:  chest pain, orthopnea, PND, swelling in lower extremities, dizziness, palpitations, syncope.  Resp: cough, hemoptysis, dyspnea, wheezing. GI: heartburn, indigestion, abdominal pain, nausea, vomiting, diarrhea, constipation, change in bowel habits, loss of appetite, hematemesis, melena, hematochezia.  GU: dysuria, change in color of urine, urgency or frequency, flank pain, hematuria. MSK: joint pain or swelling, decreased range of motion. Psych: change in mood or affect, depression, anxiety, suicidal ideations, homicidal ideations. Skin: rash, itching, bruising.  VITAL SIGNS: BP (!) 128/35   Pulse 81   Temp 98.4 F (36.9 C) (Oral)   Resp 10   Ht 5' 2.5" (1.588 m)   Wt 223 lb 5.2 oz (101.3 kg)   SpO2 100%   BMI 40.20 kg/m   HEMODYNAMICS: CVP:  [8 mmHg] 8 mmHgon phenylephrine 25  VENTILATOR SETTINGS: Vent Mode: PSV;CPAP FiO2 (%):  [  40 %] 40 % PEEP:  [5 cmH20] 5 cmH20 Pressure Support:  [5 cmH20] 5  cmH20  INTAKE / OUTPUT: I/O last 3 completed shifts: In: 3216.2 [I.V.:3216.2] Out: 2360 [Urine:800; Drains:1160; Stool:100; Blood:300]  PHYSICAL EXAMINATION: Blood pressure (!) 128/35, pulse 81, temperature 98.4 F (36.9 C), temperature source Oral, resp. rate 10, height 5' 2.5" (1.588 m), weight 223 lb 5.2 oz (101.3 kg), SpO2 100 %. Gen:      No acute distress HEENT:  EOMI, sclera anicteric Neck:     No masses; no thyromegaly, ETT Lungs:    Clear to auscultation bilaterally; normal respiratory effort CV:         Regular rate and rhythm; no murmurs Abd:      + bowel sounds; soft, non-tender; no palpable masses, no distension Ext:    No edema; adequate peripheral perfusion Skin:      Warm and dry; no rash Neuro: alert and oriented x 3 Psych: normal mood and affect  LABS:  BMET  Recent Labs Lab 04/02/17 0402 04/03/17 1015 04/03/17 1106  NA 140 134* 135  K 4.6 3.7 3.8  CL 109 106 107  CO2 19* 20* 22  BUN 23* 35* 35*  CREATININE 1.70* 1.67* 1.67*  GLUCOSE 177* 148* 148*    Electrolytes  Recent Labs Lab 04/01/17 2258 04/02/17 0402 04/03/17 1015 04/03/17 1106  CALCIUM 8.7* 8.3* 8.0* 8.0*  MG 1.2* 1.2* 2.0  --   PHOS 7.4*  --   --   --     CBC  Recent Labs Lab 04/02/17 0402 04/03/17 1015 04/03/17 1106  WBC 38.9* 15.2* 19.5*  HGB 10.4* 5.7* 7.0*  HCT 31.4* 16.9* 20.6*  PLT 316 136* 179    Coag's No results for input(s): APTT, INR in the last 168 hours.  Sepsis Markers No results for input(s): LATICACIDVEN, PROCALCITON, O2SATVEN in the last 168 hours.  ABG  Recent Labs Lab 04/01/17 2350  PHART 7.265*  PCO2ART 38.6  PO2ART 208*    Liver Enzymes No results for input(s): AST, ALT, ALKPHOS, BILITOT, ALBUMIN in the last 168 hours.  Cardiac Enzymes No results for input(s): TROPONINI, PROBNP in the last 168 hours.  Glucose  Recent Labs Lab 04/02/17 0800 04/02/17 1133 04/02/17 1733 04/02/17 2112 04/03/17 0828 04/03/17 1210  GLUCAP 151*  155* 110* 142* 131* 139*    Imaging No results found. ANTIBIOTICS: clinda + genta X1  SIGNIFICANT EVENTS: OR 8/31  LINES/TUBES: Art line   ASSESSMENT / PLAN: Metastatic endometrial CA s/p prolonged sx to take down colostomy with multiple adhesios. Please refer to OR note. Coming in with hypovolemic shock and acute hypoxemic respiratory failure requiring mechanical ventilation   PULMONARY A: Acute resp failure post op requiring mechanical ventilation   P:   Stable post extubation Wean down Fio2  CARDIOVASCULAR A:  Hypovolemic shock requiring vasopressors   P:  Off pressors   RENAL A:   Post ureteral stent and reimplantation  Acute kidney injury P:   Repeat lytes Follow urine output and creatinine  GASTROINTESTINAL A:   Extensive GI surgery  P:   Management per surgery  HEMATOLOGIC A:   Leukocytosis ? Stress related. She has chronic neutropenia  Low Hb P:  Follow WBC On clindamycin  Blood cultures she spikes. Transfuse 1 unit PRBC  INFECTIOUS A:   No signs of infections  P:   On clinda for surgical prophylaxis  ENDOCRINE A:   No issues  P:   None   NEUROLOGIC A:  Sedated  metabolic encephalopathy secondary to sedation  P:   Pain control   FAMILY  - Updates: none available   - Inter-disciplinary family meet or Palliative Care meeting due by:  9/7  PCCM will be available as needed. Please call with questions  Marshell Garfinkel MD [place] [facility] Pager [phone] If no answer or after 3pm call: [phone] 04/03/2017, 1:33 PM

## 2017-04-03 NOTE — Consult Note (Signed)
[place] Nurse ostomy follow up Stoma type/location: RLQ ileostomy. Post op pouch intact. Stomal assessment/size: Approximately 1 and 5/8 inch stoma, deep red, slightly long and edematous (visualized through pouch). Offer to change pouch declined today as husband is not here; he was expecting pouch change on Monday.  Will change tomorrow as scheduled by my partner. She is getting blood during this visit. Peristomal assessment: Not seen today Treatment options for stomal/peristomal skin: Not seen today. Output: Liquid dark brown effleunt  Ostomy pouching: 2pc. 2 and 1/4 inch in place. We will assess tomorrow and stay with a two piece if the topography allows. A skin barrier ring will be used. Education provided: Patient and I discuss the difference between a colostomy and in ileostomy.  We discuss dietary recommendations such as the need to chew her food completely and well and to drink fluids with meals. She is happy that she stayed in the ICU a second day and moving to the floor tomorrow.  I reassure her that as her condition improves, she will appreciate the environment of a med-surg floor and the skill level of those staff who are well familiar with recovering patients from the same and similar surgeries. She expresses appreciation for our talk today and is more confident about the continued plans for her recovery. She tells me that she was able to tolerate eggs and 2 protein drinks today. Bedside RN and patient emptied pouch today (Patient talked RN through procedure so that tail closure was clean.)  Enrolled patient in [place] Discharge program:No  [facility] nursing team will follow, and will remain available to this patient, the nursing and medical teams.   Thanks, Maudie Flakes, MSN, RN, [place], Arther Abbott  Pager# [phone]

## 2017-04-04 DIAGNOSIS — D649 Anemia, unspecified: Secondary | ICD-10-CM

## 2017-04-04 LAB — POCT I-STAT 4, (NA,K, GLUC, HGB,HCT)
GLUCOSE: 210 mg/dL — AB (ref 65–99)
HCT: 28 % — ABNORMAL LOW (ref 36.0–46.0)
Hemoglobin: 9.5 g/dL — ABNORMAL LOW (ref 12.0–15.0)
Potassium: 3.8 mmol/L (ref 3.5–5.1)
Sodium: 140 mmol/L (ref 135–145)

## 2017-04-04 LAB — HEMOGLOBIN AND HEMATOCRIT, BLOOD
HEMATOCRIT: 24.3 % — AB (ref 36.0–46.0)
HEMOGLOBIN: 8.3 g/dL — AB (ref 12.0–15.0)

## 2017-04-04 LAB — GLUCOSE, CAPILLARY
GLUCOSE-CAPILLARY: 137 mg/dL — AB (ref 65–99)
Glucose-Capillary: 177 mg/dL — ABNORMAL HIGH (ref 65–99)
Glucose-Capillary: 116 mg/dL — ABNORMAL HIGH (ref 65–99)
Glucose-Capillary: 146 mg/dL — ABNORMAL HIGH (ref 65–99)

## 2017-04-04 LAB — CBC
HCT: 20 % — ABNORMAL LOW (ref 36.0–46.0)
HEMATOCRIT: 16.9 % — AB (ref 36.0–46.0)
Hemoglobin: 5.7 g/dL — CL (ref 12.0–15.0)
Hemoglobin: 6.9 g/dL — CL (ref 12.0–15.0)
MCH: 29.1 pg (ref 26.0–34.0)
MCH: 30.3 pg (ref 26.0–34.0)
MCHC: 33.7 g/dL (ref 30.0–36.0)
MCHC: 34.5 g/dL (ref 30.0–36.0)
MCV: 86.2 fL (ref 78.0–100.0)
MCV: 87.7 fL (ref 78.0–100.0)
Platelets: 136 10*3/uL — ABNORMAL LOW (ref 150–400)
Platelets: 126 10*3/uL — ABNORMAL LOW (ref 150–400)
RBC: 1.96 MIL/uL — ABNORMAL LOW (ref 3.87–5.11)
RBC: 2.28 MIL/uL — ABNORMAL LOW (ref 3.87–5.11)
RDW: 17 % — AB (ref 11.5–15.5)
RDW: 16.6 % — ABNORMAL HIGH (ref 11.5–15.5)
WBC: 15.2 10*3/uL — ABNORMAL HIGH (ref 4.0–10.5)
WBC: 8.1 10*3/uL (ref 4.0–10.5)

## 2017-04-04 LAB — BASIC METABOLIC PANEL
Anion gap: 5 (ref 5–15)
BUN: 32 mg/dL — AB (ref 6–20)
CHLORIDE: 106 mmol/L (ref 101–111)
CO2: 23 mmol/L (ref 22–32)
Calcium: 7.7 mg/dL — ABNORMAL LOW (ref 8.9–10.3)
Creatinine, Ser: 1.1 mg/dL — ABNORMAL HIGH (ref 0.44–1.00)
GFR calc Af Amer: >60 mL/min (ref >60)
GFR, EST NON AFRICAN AMERICAN: 53 mL/min — AB (ref >60)
GLUCOSE: 156 mg/dL — AB (ref 65–99)
Potassium: 3.6 mmol/L (ref 3.5–5.1)
Sodium: 134 mmol/L — ABNORMAL LOW (ref 135–145)

## 2017-04-04 LAB — PREPARE RBC (CROSSMATCH)

## 2017-04-04 MED ORDER — METHOCARBAMOL 1000 MG/10ML IJ SOLN
1000.0000 mg | Freq: Four times a day (QID) | INTRAVENOUS | Status: DC | PRN
  Filled 2017-04-04: qty 10

## 2017-04-04 MED ORDER — METHOCARBAMOL 500 MG PO TABS: 1000.0000 mg | ORAL_TABLET | Freq: Four times a day (QID) | ORAL | Status: DC | PRN

## 2017-04-04 MED ORDER — FENTANYL CITRATE (PF) 100 MCG/2ML IJ SOLN
50.0000 ug | INTRAMUSCULAR | Status: DC | PRN
  Administered 2017-04-04: 50 ug via INTRAVENOUS
  Filled 2017-04-04: qty 2

## 2017-04-04 MED ORDER — FENTANYL BOLUS VIA INFUSION: 50.0000 ug | INTRAVENOUS | Status: DC | PRN

## 2017-04-04 MED ORDER — METFORMIN HCL 500 MG PO TABS
1000.0000 mg | ORAL_TABLET | Freq: Two times a day (BID) | ORAL | Status: DC
  Administered 2017-04-04 – 2017-04-07 (×7): 1000 mg via ORAL
  Filled 2017-04-04 (×8): qty 2

## 2017-04-04 MED ORDER — SODIUM CHLORIDE 0.9 % IV SOLN: Freq: Once | INTRAVENOUS | Status: DC

## 2017-04-04 MED ORDER — SODIUM CHLORIDE 0.9 % IV SOLN: 250.0000 mL | INTRAVENOUS | Status: DC | PRN

## 2017-04-04 MED ORDER — SODIUM CHLORIDE 0.9% FLUSH
3.0000 mL | Freq: Two times a day (BID) | INTRAVENOUS | Status: DC
  Administered 2017-04-04 – 2017-04-06 (×5): 3 mL via INTRAVENOUS

## 2017-04-04 MED ORDER — ENOXAPARIN SODIUM 40 MG/0.4ML ~~LOC~~ SOLN
40.0000 mg | Freq: Every day | SUBCUTANEOUS | Status: DC
  Administered 2017-04-05: 40 mg via SUBCUTANEOUS
  Filled 2017-04-04: qty 0.4

## 2017-04-04 MED ORDER — OXYCODONE HCL 5 MG PO TABS
5.0000 mg | ORAL_TABLET | ORAL | Status: DC | PRN
  Administered 2017-04-04: 5 mg via ORAL
  Filled 2017-04-04: qty 1

## 2017-04-04 MED ORDER — SODIUM CHLORIDE 0.9% FLUSH
3.0000 mL | INTRAVENOUS | Status: DC | PRN
  Administered 2017-04-04: 3 mL via INTRAVENOUS
  Filled 2017-04-04: qty 3

## 2017-04-04 MED ORDER — SODIUM CHLORIDE 0.9% FLUSH
10.0000 mL | INTRAVENOUS | Status: DC | PRN
  Administered 2017-04-06: 10 mL
  Filled 2017-04-04: qty 40

## 2017-04-04 NOTE — Progress Notes (Signed)
[facility] Progress Note Patient Name: [name] DOB: Sep 03, 1955 MRN: [id number]   Date of Service  04/04/2017  HPI/Events of Note  Anemia - Hgb = 6.9.  eICU Interventions  Will transfuse 1 unit PRBC.        [name] 04/04/2017, 5:28 AM

## 2017-04-04 NOTE — Consult Note (Signed)
[place] Nurse ostomy follow up I am assisted in today's session by bedside RN, Judson Roch.  Stoma type/location: RUQ ileostomy (loop). Stomal assessment/size: dusky mucosa, proximal is slightly long. Distal limb is near-flush. Peristomal assessment: intact. Treatment options for stomal/peristomal skin: Skin barrier ring Output: 267mls brown/green effluent emptied from pouch during change. Ostomy pouching: 2pc. 2 and 1/4 inch ostomy pouching system with skin barrier ring Education provided: Patient and husband taught the differences of ostomy pouching with an ileostomy and a colostomy. Demonstrated the longer limb of the proximal loop, near-flush limb of the distal loop. Proximal limb is functioning at time of pouch change, so they are instructed that they will have more success when performing a routine ostomy pouching change if they perform in the morning before drinking or eating. Patient's husband assists her with pouch changes; she showers with pouch on and they change afterwards.  Dietary considerations taught; biggest risk is dehydration, followed by blockage.  We discuss chewing food well and always following food with fluids. Foods that thicken stool are discussed, she is familiar with the BRAT diet (banannas, rice applesauce, tapioca or tea) and we discuss other thickeners such as cheese, peanut butter and marshmallows. She is taught that there is less odor with an ileostomy and considerably less gas. Today she is placed in a transparent pouch so that staff can visualize effluent. Patient and spouse discuss satisfaction with supplier [facility]) and plan to continue with them once supplies are determined. A teaching book is left at bedside. Husband has his own book on an ileostomy at home. Enrolled patient in [place] Start Discharge program: No WOC nursing team will not follow, but will remain available to this patient, the nursing and medical teams.  Please re-consult if needed. Thanks, Maudie Flakes, MSN, RN, Jay, Arther Abbott  Pager# [phone]

## 2017-04-04 NOTE — Progress Notes (Signed)
Urology Inpatient Progress Report  recurrent endometrial cancer status post low anterior resection and  partial exoneration  Desire for colostomy takedown  Procedure(s): XI ROBOTIC VS LAPAROSCOPIC COLOSTOMY TAKEDOWN WITH LYSIS OF ADHESIONS. RIDGE PROCTOSCOPY XI ROBOTICALLY ASSISTED LAPAROSCOPIC URETERAL RE-IMPLANTATION CYSTOSCOPY WITH RETROGRADE PYELOGRAM, URETEROSCOPY AND STENT PLACEMENT  3 Days Post-Op   Intv/Subj:  Urine clear. JP with 80cc output yesterday  Principal Problem:   Pelvic cancer s/p colostomy takedown/loop ileostomy diversion 04/01/2017 Active Problems:   Primary hypothyroidism   DM type 2 (diabetes mellitus, type 2) ([place])   Benign essential HTN   History of ovarian & endometrial cancer   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 s/p resection/partial vaginectomy/ LAR/colostomy 11/19/2014   Endotracheally intubated   Morbid obesity ([place])   Pancytopenia, acquired ([place])   Chronic anemia   Breast cancer of upper-inner quadrant of left female breast ([place])   Ureteral stricture, right, 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 0.9 %  sodium chloride infusion   Intravenous Once Anders Simmonds, MD      . 0.9 %  sodium chloride infusion  250 mL Intravenous PRN Michael [name], MD      . acetaminophen (TYLENOL) tablet 1,000 mg  1,000 mg Oral TID Michael [name], MD   1,000 mg at 04/04/17 1008  . alum & mag hydroxide-simeth (MAALOX/MYLANTA) 200-200-20 MG/5ML suspension 30 mL  30 mL Oral Q6H PRN Michael [name], MD      . anastrozole (ARIMIDEX) tablet 1 mg  1 mg Oral Daily Michael [name], MD   1 mg at 04/04/17 1008  . artificial tears (LACRILUBE) ophthalmic ointment   Both Eyes QHS Berton Mount, RPH      . aspirin EC tablet 81 mg  81 mg Oral Daily Michael [name], MD   81  mg at 04/04/17 1007  . bismuth subsalicylate (PEPTO BISMOL) 262 MG/15ML suspension 30 mL  30 mL Oral Q8H PRN Michael [name], MD      . calcium-vitamin D (OSCAL WITH D) 500-200 MG-UNIT per tablet 1 tablet  1 tablet Oral Daily Berton Mount, RPH   1 tablet at 04/04/17 1007   And  . magnesium gluconate (MAGONATE) tablet 250 mg  250 mg Oral Daily Berton Mount, RPH   250 mg at 04/04/17 1006  . chlorhexidine gluconate (MEDLINE KIT) (PERIDEX) 0.12 % solution 15 mL  15 mL Mouth Rinse BID Michael [name], MD   15 mL at 04/04/17 0820  . Chlorhexidine Gluconate Cloth 2 % PADS 6 each  6 each Topical Q0600 Michael [name], MD   6 each at 04/04/17 0600  . cholecalciferol (VITAMIN D) tablet 10,000 Units  10,000 Units Oral Weekly Michael [name], MD      . diphenhydrAMINE (BENADRYL) 12.5 MG/5ML elixir 12.5 mg  12.5 mg Oral Q6H PRN Michael [name], MD       Or  . diphenhydrAMINE (BENADRYL) injection 12.5 mg  12.5 mg Intravenous Q6H PRN Michael [name], MD      . diphenhydrAMINE (BENADRYL) capsule 25 mg  25 mg Oral QHS PRN Michael [name], MD      . Derrill Memo ON 04/05/2017] enoxaparin (LOVENOX) injection 40 mg  40 mg Subcutaneous QHS Michael [name], MD      . fentaNYL (SUBLIMAZE) injection 50 mcg  50 mcg Intravenous Q2H PRN Nyoka Cowden, Terri L,  RPH      . gabapentin (NEURONTIN) capsule 300 mg  300 mg Oral BID Michael [name], MD   300 mg at 04/04/17 1008  . guaiFENesin-dextromethorphan (ROBITUSSIN DM) 100-10 MG/5ML syrup 10 mL  10 mL Oral Q4H PRN Michael [name], MD      . hydrALAZINE (APRESOLINE) injection 5-20 mg  5-20 mg Intravenous Q6H PRN Michael [name], MD      . hydrocortisone (ANUSOL-HC) 2.5 % rectal cream 1 application  1 application Topical QID PRN Michael [name], MD      . hydrocortisone cream 1 % 1 application  1 application Topical TID PRN Michael [name], MD      . HYDROmorphone (DILAUDID) injection 0.5-2 mg  0.5-2 mg Intravenous Q1H PRN Michael [name], MD   1 mg at 04/04/17 0818  . insulin aspart (novoLOG) injection 0-15  Units  0-15 Units Subcutaneous TID WC Michael [name], MD   2 Units at 04/04/17 [phone]  . insulin aspart (novoLOG) injection 0-5 Units  0-5 Units Subcutaneous QHS Michael [name], MD      . lactated ringers infusion 1,000 mL  1,000 mL Intravenous Q8H PRN Gross, Remo Lipps, MD      . levothyroxine (SYNTHROID, LEVOTHROID) tablet 150 mcg  150 mcg Oral QAC breakfast Michael [name], MD   150 mcg at 04/04/17 2725  . linagliptin (TRADJENTA) tablet 5 mg  5 mg Oral Daily Michael [name], MD   5 mg at 04/04/17 1007  . lip balm (CARMEX) ointment 1 application  1 application Topical BID Michael [name], MD   1 application at 36/64/40 2124  . loratadine (CLARITIN) tablet 10 mg  10 mg Oral Daily Michael [name], MD   10 mg at 04/04/17 1007  . magic mouthwash  15 mL Oral QID PRN Michael [name], MD      . MEDLINE mouth rinse  15 mL Mouth Rinse QID Michael [name], MD   15 mL at 04/03/17 1801  . menthol-cetylpyridinium (CEPACOL) lozenge 3 mg  1 lozenge Oral PRN Michael [name], MD      . metFORMIN (GLUCOPHAGE) tablet 1,000 mg  1,000 mg Oral BID WC Michael [name], MD   1,000 mg at 04/04/17 1005  . methocarbamol (ROBAXIN) tablet 1,000 mg  1,000 mg Oral Q6H PRN Nyoka Cowden, Terri L, RPH       And  . methocarbamol (ROBAXIN) 1,000 mg in dextrose 5 % 50 mL IVPB  1,000 mg Intravenous Q6H PRN Minda Ditto, RPH      . metoprolol tartrate (LOPRESSOR) injection 5 mg  5 mg Intravenous Q6H PRN Michael [name], MD      . ondansetron [facility]) tablet 4 mg  4 mg Oral Q6H PRN Michael [name], MD       Or  . ondansetron (ZOFRAN) injection 4 mg  4 mg Intravenous Q6H PRN Michael [name], MD      . oxyCODONE (Oxy IR/ROXICODONE) immediate release tablet 5-10 mg  5-10 mg Oral Q4H PRN Michael [name], MD      . phenol (CHLORASEPTIC) mouth spray 1-2 spray  1-2 spray Mouth/Throat PRN Michael [name], MD      . prenatal multivitamin tablet 1 tablet  1 tablet Oral Daily Michael [name], MD   1 tablet at 04/04/17 1006  . prochlorperazine (COMPAZINE) injection 5-10 mg  5-10 mg  Intravenous Q4H PRN Michael [name], MD      . protein supplement (PREMIER PROTEIN) liquid  11 oz Oral BID BM Michael [name], MD   11 oz at 04/03/17 1449  . rosuvastatin (  CRESTOR) tablet 10 mg  10 mg Oral QPM Michael [name], MD   10 mg at 04/03/17 1800  . saccharomyces boulardii (FLORASTOR) capsule 250 mg  250 mg Oral BID Michael [name], MD   250 mg at 04/04/17 1007  . sodium chloride flush (NS) 0.9 % injection 3 mL  3 mL Intravenous Gorden Harms, MD      . sodium chloride flush (NS) 0.9 % injection 3 mL  3 mL Intravenous PRN Michael [name], MD      . Derrill Memo ON 04/06/2017] Tbo-Filgrastim (GRANIX) injection 480 mcg  480 mcg Subcutaneous Once every 6 days Berton Mount, RPH      . witch hazel-glycerin (TUCKS) pad 1 application  1 application Topical PRN Michael [name], MD         Objective: Vital: Vitals:   04/04/17 0600 04/04/17 0615 04/04/17 0630 04/04/17 0800  BP: (!) 123/42  (!) 134/44 113/72  Pulse: 88  84   Resp: 12 16 18  (!) 23  Temp: 98.8 F (37.1 C)  98.3 F (36.8 C)   TempSrc: Oral  Oral   SpO2: 98% 98% 100% 96%  Weight:      Height:       I/Os: I/O last 3 completed shifts: In: 782.1 [I.V.:407.1; Blood:375] Out: 2710 [ENIDP:8242; Drains:350; Stool:700]  Physical Exam:  General: Patient is in no apparent distress Lungs: Normal respiratory effort, chest expands symmetrically. GI: Incisions are c/d/i.  The abdomen is soft and appropriately tender JP drain with serosanguinous drainage Foley: clear urine Ext: lower extremities symmetric  Lab Results:  Recent Labs  04/03/17 1015 04/03/17 1106 04/03/17 2121 04/04/17 0252  WBC 15.2* 19.5*  --  8.1  HGB 5.7* 7.0* 7.5* 6.9*  HCT 16.9* 20.6* 21.9* 20.0*    Recent Labs  04/03/17 1015 04/03/17 1106 04/04/17 0252  NA 134* 135 134*  K 3.7 3.8 3.6  CL 106 107 106  CO2 20* 22 23  GLUCOSE 148* 148* 156*  BUN 35* 35* 32*  CREATININE 1.67* 1.67* 1.10*  CALCIUM 8.0* 8.0* 7.7*   No results for input(s): LABPT, INR  in the last 72 hours. No results for input(s): LABURIN in the last 72 hours. Results for orders placed or performed during the hospital encounter of 04/01/17  MRSA PCR Screening     Status: None   Collection Time: 04/02/17  2:02 AM  Result Value Ref Range Status   MRSA by PCR NEGATIVE NEGATIVE Final    Comment:        The GeneXpert MRSA Assay (FDA approved for NASAL specimens only), is one component of a comprehensive MRSA colonization surveillance program. It is not intended to diagnose MRSA infection nor to guide or monitor treatment for MRSA infections.     Studies/Results: Dg Abd 1 View  Result Date: 04/02/2017 CLINICAL DATA:  OG placement EXAM: ABDOMEN - 1 VIEW COMPARISON:  Earlier films of same day FINDINGS: Orogastric tube extends to the gastric body, stomach decompressed. Normal bowel gas pattern. Stable right double-J ureteral stent. Drain catheter loops over the lower abdomen. Cholecystectomy clips. Multiple surgical clips project over the sacrum. No abnormal abdominal calcifications. Regional bones unremarkable. IMPRESSION: 1. Stable position of orogastric tube, in the gastric body Electronically Signed   By: Lucrezia Europe M.D.   On: 04/02/2017 11:12   Dg Abd 1 View  Result Date: 04/02/2017 CLINICAL DATA:  Orogastric tube placement EXAM: ABDOMEN - 1 VIEW COMPARISON:  Earlier film of the same day FINDINGS: Orogastric tube extends  to the body of the decompressed stomach. Normal bowel gas pattern. Stable right double-J ureteral stent. Drain catheter projects over the lower abdomen. Cholecystectomy clips. Multiple surgical clips project over the sacrum. No abnormal abdominal calcifications. Regional bones unremarkable. IMPRESSION: 1. Orogastric tube to the body of the stomach. Electronically Signed   By: Lucrezia Europe M.D.   On: 04/02/2017 11:11   Dg Abd Portable 1v  Result Date: 04/02/2017 CLINICAL DATA:  OG tube placement EXAM: PORTABLE ABDOMEN - 1 VIEW COMPARISON:  None available  FINDINGS: OG tube within the collapsed stomach body in the left abdomen. Right ureteral stent in place. Jejunal feeding tube over the lower abdomen and pelvis. Nonobstructive bowel gas pattern. IMPRESSION: OG tube within the stomach. Electronically Signed   By: Jerilynn Mages.  Shick M.D.   On: 04/02/2017 10:26    Assessment: Procedure(s): XI ROBOTIC VS LAPAROSCOPIC COLOSTOMY TAKEDOWN WITH LYSIS OF ADHESIONS. RIDGE PROCTOSCOPY XI ROBOTICALLY ASSISTED LAPAROSCOPIC URETERAL RE-IMPLANTATION CYSTOSCOPY WITH RETROGRADE PYELOGRAM, URETEROSCOPY AND STENT PLACEMENT, 3 Days Post-Op  doing well. Foley draining clear urine, JP output fairly high, don't suspect urine leak at this time, but will continue to monitor.   Plan: Routine care per surgery Will continue to follow.   Nicolette Bang

## 2017-04-04 NOTE — Op Note (Signed)
NAME:  [name], [name]                     ACCOUNT NO.:  MEDICAL RECORD NO.:  [id number]  LOCATION:                                 FACILITY:  PHYSICIAN:  Alexis Frock, MD     DATE OF BIRTH:  1956-07-31  DATE OF PROCEDURE: 04/01/2017                              OPERATIVE REPORT   DIAGNOSIS:  Right ureteral stricture.  PROCEDURES: 1. Cystoscopy with right retrograde pyelogram and interpretation. 2. Robotic right ureteral reimplantation with psoas hitch. 3. Exchange of right ureteral stent.   ESTIMATED BLOOD LOSS:  100 mL.  COMPLICATION:  None.  SPECIMEN:  None.  ASSISTANT:  [name], MD.  FINDINGS: 1. Severe radiation changes to the pelvis with dense adhesions. 2. Excellent psoas hitching of the right superior bladder to the right     psoas muscle. 3. Successful anastomosis and reimplant of the right ureter to the     dome of the bladder above the level of the iliacs. 4. Right distal ureteral stricture with retrograde pyelogram. 5. Successful placement of right ureteral stent, proximal end in the     renal pelvis and distal end in the urinary bladder.  INDICATION:  Ms. Sandhu is unfortunate 61 year old lady with history of multiple primary cancers including breast cancer as well as advanced endometrial cancer.  She is status post resection in radiation and end colostomy for endometrial cancer in 2016, at which time, she also sustained a right ureteral injury, this unfortunately resulted in a high- grade dense ureteral stricture, which managed the chronic stenting and thus failed the trial of endoscopic management.  Further options were discussed including definitive right ureteral reimplant with goal of stone free, and the pros and cons of this especially given her extensive surgical history and exposure of pelvic radiation.  She adamantly wished to proceed with this with colostomy take down and a combined surgical approach was planned with General Surgery team  with myself and Dr. Johney Maine as primary surgeons for perspective parts.  Informed consent was obtained and placed in the medical record.  PROCEDURE IN DETAIL:  The patient being [facility], was verified. Procedure being cysto, right retrograde, right stent change and right ureteral reimplant was confirmed.  Procedure was carried out.  Time-out was performed.  Intravenous antibiotics were administered.  General endotracheal anesthesia was introduced.  The patient was placed into a low lithotomy position, and sterile field was created by prepping and draping the patient's entire abdomen and the vagina, introitus and proximal thighs.  Enlarged CT-type drape was placed.  Next, attention was directed at retrograde stent change.  Cystourethroscopy was performed using a 22-French rigid cystoscope with offset lens.  Inspection of the urinary bladder revealed no diverticula, calcifications, papillary lesions.  The distal end of the right ureteral orifice was seen and the stent was grasped, brought out in its entirety, set aside for discard, and 6-French end-hole catheter was used and right retrograde pyelogram was obtained.  Right retrograde pyelogram demonstrated a single right ureter with single-system right kidney.  There was moderate hydronephrosis as per baseline and distal narrowing consistent with known stricture and this appeared to be distal to the level  of the iliacs as per may times prior. A 0.038 Zip wire was advanced to the level of the lower pole and a new 6 x 24 Contour-type stent was placed using cystoscopic and fluoroscopic guidance.  Good proximal and distal deployment were noted.  Next, robot was docked as per General Surgery.  An extensive adhesiolysis was performed in freeing of the bowel as per separate operative note by General Surgery team.  Following freeing of the bowel and ensuring that new colostomy would reach, we took back over with initial bladder mobilization.   Bladder was mobilized by dissecting lateral to medial umbilical ligaments and then a space of Retzius was developed.  Ureter was encountered as it coursed over the iliac vessels.  There was significant desmoplastic radiation response in this area.  An exquisite care was taken to dissect proximally for distance approximately 4 cm, and distally for distance approximately 4 cm.  The area distal to the iliacs was quite densely scarified and felt that reimplant to the level just above the iliacs would be most advantageous as per prior plan. With lateral mobilization, it was clear that additional length would be required.  Incision was made to proceed with a Heineke-Mikulicz type bladder anterior incision to create more length.  Given that this would be lying in the area necessary for bowel-rectal anastomosis and that portion was then performed as per General Surgery team and then, we once again put our attention at reimplant.  The ureter was carefully transected, taking great care not to transect the stent at level of approximately 1 cm proximal to the iliac vessels.  The distal end was clipped, approximately 6-cm incision was made from left to right crossing anterior bladder.  Bladder was inspected via this cystotomy. Cystotomy was then closed in superior-inferior direction and thus, performing a Heineke-Mikulicz procedure, this allowed her approximately 4 cm additional superior elevation allowing the ureter in the dome of the bladder to come into the tension-free apposition.  The area was tacked as posterolateral aspect to psoas musculature with interrupted PDS x2 in the direction of the psoas tendon.  A small incision was made in the dome of the bladder approximately 4 mm.  The ureter was spatulated for distance approximately 1 cm, and ureteral to bladder dome anastomosis was performed with a heel stitch of 3-0 Monocryl followed by two separate running suture lines of 4-0 Monocryl, which  resulted in excellent mucosal apposition.  Additional perivesical fat was laid over the area of reimplantation to guard it.  Procedure was then once again turned over to General Surgery team for the performance of the remainder of their procedures, for which, I provided first assistance.  Procedure was then terminated.  The patient tolerated the procedure well, no immediate periprocedural complications.  The patient was taken to the postanesthesia care unit with plan for ICU admission and continued intubation overnight.          ______________________________ Alexis Frock, MD     TM/MEDQ  D:  04/01/2017  T:  04/01/2017  Job:  [id number]

## 2017-04-04 NOTE — Care Management Note (Signed)
Case Management Note  Patient Details  Name: [name] MRN: [id number] Date of Birth: [birth year]  Subjective/Objective:                  Pelvic cancer s/p colostomy takedown/loop ileostomy diversion 04/01/2017  Action/Plan: Date:  April 04, 2017 Chart reviewed for concurrent status and case management needs. Will continue to follow patient progress. Discharge Planning: following for needs Expected discharge date: [id number] Velva Harman, BSN, [place], [place]  Expected Discharge Date:                  Expected Discharge Plan:  Home/Self Care  In-House Referral:     Discharge planning Services  CM Consult  Post Acute Care Choice:    Choice offered to:     DME Arranged:    DME Agency:     HH Arranged:    HH Agency:     Status of Service:  In process, will continue to follow  If discussed at Long Length of Stay Meetings, dates discussed:    Additional Comments:  Leeroy Cha, RN 04/04/2017, 8:37 AM

## 2017-04-04 NOTE — Progress Notes (Addendum)
[address] [place], [place] [address] Phone: [phone]  FAX: [phone]      [name] [id number] February 22, 1956  CARE TEAM:  PCP: Ann Held, DO  Outpatient Care Team: Patient Care Team: Carollee Herter, Alferd Apa, DO as PCP - General (Family Medicine) Heath Lark, MD as Consulting Physician (Hematology and Oncology) Everitt Amber, MD as Consulting Physician (Obstetrics and Gynecology) Michael [name], MD as Consulting Physician (General Surgery) Alexis Frock, MD as Consulting Physician (Urology) Altheimer, Legrand Como, MD as Consulting Physician (Endocrinology) Katy Apo, MD as Consulting Physician (Ophthalmology) Stark Klein, MD as Consulting Physician (General Surgery) Irene Limbo, MD as Consulting Physician (Plastic Surgery)  Inpatient Treatment Team: Treatment Team: Attending Provider: Michael [name], MD; Consulting Physician: Alexis Frock, MD; Rounding Team: Pccm, Md, MD   Problem List:   Principal Problem:   Pelvic cancer s/p colostomy takedown/loop ileostomy diversion 04/01/2017 Active Problems:   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 s/p resection/partial vaginectomy/ LAR/colostomy 11/19/2014   Ureteral stricture, right, s/p resection & bladder hitch reimplantation 04/01/2017   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   Primary hypothyroidism   DM type 2 (diabetes mellitus, type 2) ([place])   Benign essential HTN   History of ovarian & endometrial cancer   Endotracheally intubated   Morbid obesity ([place])   Pancytopenia, acquired ([place])   Breast cancer of upper-inner quadrant of left female breast ([place])   Vitamin D insufficiency   3 Days Post-Op  04/01/2017  POST-OPERATIVE DIAGNOSIS:    Recurrent endometrial cancer status post low anterior resection and partial pelvic exeneration   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   SURGEON:  [name], MD- Primary (Dr  Tresa Moore assist) PROCEDURE:   XI ROBOTIC LYSIS OF ADHESIONS x 5 hours XI ROBOTIC COLOSTOMY TAKEDOWN ROBOTIC SEWN COLOANAL ANASTOMOSIS Ileocecal resection with anastomosis Diverting loop ileostomy  SURGEON: Alexis Frock, MD - Primary (Dr Johney Maine assist) PROCEDURE:   Cystoscopy with Rt retrograde and stent exchange. Rt robotic ureteral reimplant with psoas hitch.    Assessment  Stabilizing.  Ileus reolving  Plan:  -adv soft diet -follow Cr - inc but UOP OK -acute on chronic anemia - transfused for Hgb drop.  transfuse PRN Hgb <7.  Follow platlets -DM.  glc control.  PO hypoglycemia.  Add metformin & SSI backup -PRN IVF bolus backup  -leukocytosis prob from stress of OR, etc - resolved.  Resume weekly Neupogen w h/o chronic neutropenia -VTE prophylaxis- SCDs, etc.  Hold lovenox tonight & follow recovery.  Resume lovenox if Hgb OK tomorrow -mobilize as tolerated to help with recovery -drain care - keep 10 days minimum -foley per Urology - suspect 2-3 weeks minimum w extensive surgery & poor tissues -transfer up to floor after transfusiuon  35 minutes spent in review, evaluation, examination, counseling, and coordination of care.  More than 50% of that time was spent in counseling.  I updated the patient's status to the patient and spouse.  Recommendations were made.  Questions were answered.  They expressed understanding & appreciation.   [name], M.D., F.A.C.S. Gastrointestinal and Minimally Invasive Surgery [facility] [address] [place], [place] [address] [phone] Main / Paging   04/04/2017    Subjective: (Chief complaint)  Hgb fell - transfused Tol pureed Denies much pain  Objective:  Vital signs:  Vitals:   04/04/17 0500 04/04/17 0600 04/04/17 0615 04/04/17 0630  BP: (!) 117/42 (!) 123/42  [name] Kitchen)  134/44  Pulse:  88  84  Resp: (!) 0 _0 Temp:  98.8 F (37.1 C)  98.3 F (36.8 C)  TempSrc:  Oral  Oral   SpO2: 99% 98% 98% 100%  Weight: 101.4 kg (223 lb 8.7 oz)     Height:           Intake/Output   Yesterday:  09/02 0701 - 09/03 0700 In: 375 [Blood:375] Out: 1990 [Urine:1310; Drains:80; Stool:600] This shift:  No intake/output data recorded.  Bowel function:  Flatus: YES  BM:  YES - thick succus  Drain: Serosanguinous - more thin   Physical Exam:  General: Pt awake/alert/oriented x4 in no acute distress Eyes: PERRL, normal EOM.  Sclera clear.  No icterus Neuro: CN II-XII intact w/o focal sensory/motor deficits. Lymph: No head/neck/groin lymphadenopathy Psych:  No delerium/psychosis/paranoia HENT: Normocephalic, Mucus membranes moist.  No thrush.  ETT in place Neck: Supple, No tracheal deviation Chest: No chest wall pain w good excursion CV:  Pulses intact.  Regular rhythm MS: Normal AROM mjr joints.  No obvious deformity  Abdomen: Soft.  Nondistended.  Mildly tender at incisions only.  No evidence of peritonitis.  No incarcerated hernias.  RUQ ileostomy less dusky.  ++gas, + liquid stool.  Mild flank bruising  Ext:  No deformity.  No BLE edema.  No cyanosis Skin: No petechiae / purpura  Results:   Labs: Results for orders placed or performed during the hospital encounter of 04/01/17 (from the past 48 hour(s))  Glucose, capillary     Status: Abnormal   Collection Time: 04/02/17  8:00 AM  Result Value Ref Range   Glucose-Capillary 151 (H) 65 - 99 mg/dL  Glucose, capillary     Status: Abnormal   Collection Time: 04/02/17 11:33 AM  Result Value Ref Range   Glucose-Capillary 155 (H) 65 - 99 mg/dL  Glucose, capillary     Status: Abnormal   Collection Time: 04/02/17  5:33 PM  Result Value Ref Range   Glucose-Capillary 110 (H) 65 - 99 mg/dL  Glucose, capillary     Status: Abnormal   Collection Time: 04/02/17  9:12 PM  Result Value Ref Range   Glucose-Capillary 142 (H) 65 - 99 mg/dL   Comment 1 Notify RN    Comment 2 Document in Chart   Glucose, capillary      Status: Abnormal   Collection Time: 04/03/17  8:28 AM  Result Value Ref Range   Glucose-Capillary 131 (H) 65 - 99 mg/dL   Comment 1 Notify RN    Comment 2 Document in Chart   CBC     Status: Abnormal   Collection Time: 04/03/17 10:15 AM  Result Value Ref Range   WBC 15.2 (H) 4.0 - 10.5 K/uL   RBC 1.96 (L) 3.87 - 5.11 MIL/uL   Hemoglobin 5.7 (LL) 12.0 - 15.0 g/dL    Comment: RESULT REPEATED AND VERIFIED CRITICAL RESULT CALLED TO, READ BACK BY AND VERIFIED WITH: FRAZIER,H AT [id number] BY HOOKER,B    HCT 16.9 (L) 36.0 - 46.0 %   MCV 86.2 78.0 - 100.0 fL   MCH 29.1 26.0 - 34.0 pg   MCHC 33.7 30.0 - 36.0 g/dL   RDW 17.0 (H) 11.5 - 15.5 %   Platelets 136 (L) 150 - 400 K/uL    Comment: RESULT REPEATED AND VERIFIED SPECIMEN CHECKED FOR CLOTS PLATELET COUNT CONFIRMED BY SMEAR   Basic metabolic panel     Status: Abnormal   Collection Time: 04/03/17 10:15  AM  Result Value Ref Range   Sodium 134 (L) 135 - 145 mmol/L   Potassium 3.7 3.5 - 5.1 mmol/L    Comment: DELTA CHECK NOTED   Chloride 106 101 - 111 mmol/L   CO2 20 (L) 22 - 32 mmol/L   Glucose, Bld 148 (H) 65 - 99 mg/dL   BUN 35 (H) 6 - 20 mg/dL   Creatinine, Ser 1.67 (H) 0.44 - 1.00 mg/dL   Calcium 8.0 (L) 8.9 - 10.3 mg/dL   GFR calc non Af Amer 32 (L) >60 mL/min   GFR calc Af Amer 37 (L) >60 mL/min    Comment: (NOTE) The eGFR has been calculated using the CKD EPI equation. This calculation has not been validated in all clinical situations. eGFR's persistently <60 mL/min signify possible Chronic Kidney Disease.    Anion gap 8 5 - 15  Magnesium     Status: None   Collection Time: 04/03/17 10:15 AM  Result Value Ref Range   Magnesium 2.0 1.7 - 2.4 mg/dL  Triglycerides     Status: Abnormal   Collection Time: 04/03/17 10:15 AM  Result Value Ref Range   Triglycerides 158 (H) <150 mg/dL    Comment: Performed at [facility] [address] [place], [place] [address]  CBC with Differential/Platelet     Status: Abnormal    Collection Time: 04/03/17 11:06 AM  Result Value Ref Range   WBC 19.5 (H) 4.0 - 10.5 K/uL   RBC 2.39 (L) 3.87 - 5.11 MIL/uL   Hemoglobin 7.0 (L) 12.0 - 15.0 g/dL   HCT 20.6 (L) 36.0 - 46.0 %   MCV 86.2 78.0 - 100.0 fL   MCH 29.3 26.0 - 34.0 pg   MCHC 34.0 30.0 - 36.0 g/dL   RDW 16.7 (H) 11.5 - 15.5 %   Platelets 179 150 - 400 K/uL   Neutrophils Relative % 89 %   Neutro Abs 17.3 (H) 1.7 - 7.7 K/uL   Lymphocytes Relative 4 %   Lymphs Abs 0.9 0.7 - 4.0 K/uL   Monocytes Relative 7 %   Monocytes Absolute 1.3 (H) 0.1 - 1.0 K/uL   Eosinophils Relative 0 %   Eosinophils Absolute 0.0 0.0 - 0.7 K/uL   Basophils Relative 0 %   Basophils Absolute 0.0 0.0 - 0.1 K/uL  Basic metabolic panel     Status: Abnormal   Collection Time: 04/03/17 11:06 AM  Result Value Ref Range   Sodium 135 135 - 145 mmol/L   Potassium 3.8 3.5 - 5.1 mmol/L   Chloride 107 101 - 111 mmol/L   CO2 22 22 - 32 mmol/L   Glucose, Bld 148 (H) 65 - 99 mg/dL   BUN 35 (H) 6 - 20 mg/dL   Creatinine, Ser 1.67 (H) 0.44 - 1.00 mg/dL   Calcium 8.0 (L) 8.9 - 10.3 mg/dL   GFR calc non Af Amer 32 (L) >60 mL/min   GFR calc Af Amer 37 (L) >60 mL/min    Comment: (NOTE) The eGFR has been calculated using the CKD EPI equation. This calculation has not been validated in all clinical situations. eGFR's persistently <60 mL/min signify possible Chronic Kidney Disease.    Anion gap 6 5 - 15  Glucose, capillary     Status: Abnormal   Collection Time: 04/03/17 12:10 PM  Result Value Ref Range   Glucose-Capillary 139 (H) 65 - 99 mg/dL  Prepare RBC     Status: None   Collection Time: 04/03/17  2:00 PM  Result Value Ref Range   Order Confirmation ORDER PROCESSED BY BLOOD BANK   Glucose, capillary     Status: Abnormal   Collection Time: 04/03/17  3:51 PM  Result Value Ref Range   Glucose-Capillary 159 (H) 65 - 99 mg/dL  Hemoglobin and hematocrit, blood     Status: Abnormal   Collection Time: 04/03/17  9:21 PM  Result Value Ref Range    Hemoglobin 7.5 (L) 12.0 - 15.0 g/dL   HCT 21.9 (L) 36.0 - 46.0 %  Glucose, capillary     Status: Abnormal   Collection Time: 04/03/17  9:33 PM  Result Value Ref Range   Glucose-Capillary 146 (H) 65 - 99 mg/dL  CBC     Status: Abnormal   Collection Time: 04/04/17  2:52 AM  Result Value Ref Range   WBC 8.1 4.0 - 10.5 K/uL   RBC 2.28 (L) 3.87 - 5.11 MIL/uL   Hemoglobin 6.9 (LL) 12.0 - 15.0 g/dL    Comment: REPEATED TO VERIFY CRITICAL RESULT CALLED TO, READ BACK BY AND VERIFIED WITH: Janene Madeira [id number] @ 0517 BY J SCOTTON    HCT 20.0 (L) 36.0 - 46.0 %   MCV 87.7 78.0 - 100.0 fL   MCH 30.3 26.0 - 34.0 pg   MCHC 34.5 30.0 - 36.0 g/dL   RDW 16.6 (H) 11.5 - 15.5 %   Platelets 126 (L) 150 - 400 K/uL  Basic metabolic panel     Status: Abnormal   Collection Time: 04/04/17  2:52 AM  Result Value Ref Range   Sodium 134 (L) 135 - 145 mmol/L   Potassium 3.6 3.5 - 5.1 mmol/L   Chloride 106 101 - 111 mmol/L   CO2 23 22 - 32 mmol/L   Glucose, Bld 156 (H) 65 - 99 mg/dL   BUN 32 (H) 6 - 20 mg/dL   Creatinine, Ser 1.10 (H) 0.44 - 1.00 mg/dL   Calcium 7.7 (L) 8.9 - 10.3 mg/dL   GFR calc non Af Amer 53 (L) >60 mL/min   GFR calc Af Amer >60 >60 mL/min    Comment: (NOTE) The eGFR has been calculated using the CKD EPI equation. This calculation has not been validated in all clinical situations. eGFR's persistently <60 mL/min signify possible Chronic Kidney Disease.    Anion gap 5 5 - 15  Prepare RBC     Status: None   Collection Time: 04/04/17  6:00 AM  Result Value Ref Range   Order Confirmation ORDER PROCESSED BY BLOOD BANK     Imaging / Studies: Dg Abd 1 View  Result Date: 04/02/2017 CLINICAL DATA:  OG placement EXAM: ABDOMEN - 1 VIEW COMPARISON:  Earlier films of same day FINDINGS: Orogastric tube extends to the gastric body, stomach decompressed. Normal bowel gas pattern. Stable right double-J ureteral stent. Drain catheter loops over the lower abdomen. Cholecystectomy clips.  Multiple surgical clips project over the sacrum. No abnormal abdominal calcifications. Regional bones unremarkable. IMPRESSION: 1. Stable position of orogastric tube, in the gastric body Electronically Signed   By: Lucrezia Europe M.D.   On: 04/02/2017 11:12   Dg Abd 1 View  Result Date: 04/02/2017 CLINICAL DATA:  Orogastric tube placement EXAM: ABDOMEN - 1 VIEW COMPARISON:  Earlier film of the same day FINDINGS: Orogastric tube extends to the body of the decompressed stomach. Normal bowel gas pattern. Stable right double-J ureteral stent. Drain catheter projects over the lower abdomen. Cholecystectomy clips. Multiple surgical clips project over the sacrum. No  abnormal abdominal calcifications. Regional bones unremarkable. IMPRESSION: 1. Orogastric tube to the body of the stomach. Electronically Signed   By: Lucrezia Europe M.D.   On: 04/02/2017 11:11   Dg Abd Portable 1v  Result Date: 04/02/2017 CLINICAL DATA:  OG tube placement EXAM: PORTABLE ABDOMEN - 1 VIEW COMPARISON:  None available FINDINGS: OG tube within the collapsed stomach body in the left abdomen. Right ureteral stent in place. Jejunal feeding tube over the lower abdomen and pelvis. Nonobstructive bowel gas pattern. IMPRESSION: OG tube within the stomach. Electronically Signed   By: Jerilynn Mages.  Shick M.D.   On: 04/02/2017 10:26    Medications / Allergies: per chart  Antibiotics: Anti-infectives    Start     Dose/Rate Route Frequency Ordered Stop   04/02/17 0159  clindamycin (CLEOCIN) IVPB 900 mg     900 mg 100 mL/hr over 30 Minutes Intravenous Every 8 hours 04/02/17 0200 04/02/17 1019   04/01/17 2140  clindamycin (CLEOCIN) 900 mg, gentamicin (GARAMYCIN) 240 mg in sodium chloride 0.9 % 1,000 mL for intraperitoneal lavage  Status:  Discontinued       As needed 04/01/17 2141 04/01/17 2145   04/01/17 1915  clindamycin (CLEOCIN) IVPB 900 mg  Status:  Discontinued     900 mg 100 mL/hr over 30 Minutes Intravenous  Once 04/01/17 1912 04/02/17 0732    04/01/17 0633  clindamycin (CLEOCIN) 900 MG/50ML IVPB    Comments:  [place], Ron   : cabinet override      04/01/17 0633 04/01/17 1844   04/01/17 0606  neomycin (MYCIFRADIN) tablet 1,000 mg  Status:  Discontinued     1,000 mg Oral 3 times per day 04/01/17 0606 04/01/17 0612   04/01/17 0606  metroNIDAZOLE (FLAGYL) tablet 1,000 mg  Status:  Discontinued     1,000 mg Oral 3 times per day 04/01/17 0606 04/01/17 0612   04/01/17 0600  clindamycin (CLEOCIN) 900 mg, gentamicin (GARAMYCIN) 240 mg in sodium chloride 0.9 % 1,000 mL for intraperitoneal lavage  Status:  Discontinued      Intraperitoneal To Surgery 03/31/17 1154 04/01/17 2158   04/01/17 0600  clindamycin (CLEOCIN) IVPB 900 mg     900 mg 100 mL/hr over 30 Minutes Intravenous 30 min pre-op 03/31/17 1157 04/01/17 2006   04/01/17 0600  gentamicin (GARAMYCIN) 340 mg in dextrose 5 % 100 mL IVPB     340 mg 217 mL/hr over 30 Minutes Intravenous 30 min pre-op 03/31/17 1157 04/01/17 0839        Note: Portions of this report may have been transcribed using voice recognition software. Every effort was made to ensure accuracy; however, inadvertent computerized transcription errors may be present.   Any transcriptional errors that result from this process are unintentional.     [name], M.D., F.A.C.S. Gastrointestinal and Minimally Invasive Surgery [facility] [address], [place], [place] [address] [phone] Main / Paging   04/04/2017

## 2017-04-05 ENCOUNTER — Encounter (HOSPITAL_COMMUNITY): Payer: Self-pay | Admitting: Surgery

## 2017-04-05 LAB — BASIC METABOLIC PANEL
ANION GAP: 4 — AB (ref 5–15)
BUN: 21 mg/dL — AB (ref 6–20)
CALCIUM: 8.1 mg/dL — AB (ref 8.9–10.3)
CO2: 27 mmol/L (ref 22–32)
Chloride: 108 mmol/L (ref 101–111)
Creatinine, Ser: 0.97 mg/dL (ref 0.44–1.00)
GFR calc Af Amer: >60 mL/min (ref >60)
Glucose, Bld: 145 mg/dL — ABNORMAL HIGH (ref 65–99)
POTASSIUM: 3.6 mmol/L (ref 3.5–5.1)
SODIUM: 139 mmol/L (ref 135–145)

## 2017-04-05 LAB — TYPE AND SCREEN
ABO/RH(D): A POS
Antibody Screen: NEGATIVE
UNIT DIVISION: 0
Unit division: 0
Unit division: 0
Unit division: 0

## 2017-04-05 LAB — CBC
HCT: 22.7 % — ABNORMAL LOW (ref 36.0–46.0)
Hemoglobin: 7.7 g/dL — ABNORMAL LOW (ref 12.0–15.0)
MCH: 29.2 pg (ref 26.0–34.0)
MCHC: 33.9 g/dL (ref 30.0–36.0)
MCV: 86 fL (ref 78.0–100.0)
PLATELETS: 157 10*3/uL (ref 150–400)
RBC: 2.64 MIL/uL — AB (ref 3.87–5.11)
RDW: 16.7 % — ABNORMAL HIGH (ref 11.5–15.5)
WBC: 4.7 10*3/uL (ref 4.0–10.5)

## 2017-04-05 LAB — BPAM RBC
BLOOD PRODUCT EXPIRATION DATE: 201809222359
BLOOD PRODUCT EXPIRATION DATE: 201809222359
Blood Product Expiration Date: 201809202359
Blood Product Expiration Date: 201809222359
ISSUE DATE / TIME: 201808311437
ISSUE DATE / TIME: 201809021416
ISSUE DATE / TIME: 201808311437
ISSUE DATE / TIME: 201809030600
UNIT TYPE AND RH: 6200
UNIT TYPE AND RH: 6200
Unit Type and Rh: 6200
Unit Type and Rh: 6200

## 2017-04-05 LAB — GLUCOSE, CAPILLARY
GLUCOSE-CAPILLARY: 171 mg/dL — AB (ref 65–99)
GLUCOSE-CAPILLARY: 127 mg/dL — AB (ref 65–99)
Glucose-Capillary: 133 mg/dL — ABNORMAL HIGH (ref 65–99)
Glucose-Capillary: 162 mg/dL — ABNORMAL HIGH (ref 65–99)

## 2017-04-05 MED ORDER — HYDROMORPHONE HCL-NACL 0.5-0.9 MG/ML-% IV SOSY
0.5000 mg | PREFILLED_SYRINGE | INTRAVENOUS | Status: DC | PRN
  Administered 2017-04-05: 0.5 mg via INTRAVENOUS
  Administered 2017-04-06 – 2017-04-07 (×3): 1 mg via INTRAVENOUS
  Filled 2017-04-05: qty 2
  Filled 2017-04-05: qty 1
  Filled 2017-04-05 (×2): qty 2

## 2017-04-05 MED ORDER — METHOCARBAMOL 500 MG PO TABS
1000.0000 mg | ORAL_TABLET | Freq: Four times a day (QID) | ORAL | Status: DC
  Administered 2017-04-05 – 2017-04-07 (×10): 1000 mg via ORAL
  Filled 2017-04-05 (×10): qty 2

## 2017-04-05 MED ORDER — SODIUM CHLORIDE 0.9 % IV SOLN
500.0000 mg | Freq: Once | INTRAVENOUS | Status: AC
  Administered 2017-04-05: 500 mg via INTRAVENOUS
  Filled 2017-04-05: qty 10

## 2017-04-05 MED ORDER — LACTATED RINGERS IV BOLUS (SEPSIS)
1000.0000 mL | Freq: Every evening | INTRAVENOUS | Status: DC
  Administered 2017-04-05 – 2017-04-06 (×2): 1000 mL via INTRAVENOUS

## 2017-04-05 MED ORDER — LACTATED RINGERS IV BOLUS (SEPSIS)
1000.0000 mL | Freq: Once | INTRAVENOUS | Status: AC
  Administered 2017-04-05: 1000 mL via INTRAVENOUS

## 2017-04-05 MED ORDER — SODIUM CHLORIDE 0.9 % IV SOLN
25.0000 mg | Freq: Once | INTRAVENOUS | Status: AC
  Administered 2017-04-05: 25 mg via INTRAVENOUS
  Filled 2017-04-05: qty 0.5

## 2017-04-05 MED ORDER — LOPERAMIDE HCL 2 MG PO CAPS
2.0000 mg | ORAL_CAPSULE | Freq: Every day | ORAL | Status: DC
  Administered 2017-04-05: 2 mg via ORAL
  Filled 2017-04-05: qty 1

## 2017-04-05 MED ORDER — HYDROMORPHONE HCL 2 MG PO TABS
2.0000 mg | ORAL_TABLET | ORAL | Status: DC | PRN
  Administered 2017-04-05: 2 mg via ORAL
  Administered 2017-04-05: 4 mg via ORAL
  Administered 2017-04-05: 2 mg via ORAL
  Administered 2017-04-06 (×3): 4 mg via ORAL
  Administered 2017-04-07: 2 mg via ORAL
  Filled 2017-04-05: qty 2
  Filled 2017-04-05 (×2): qty 1
  Filled 2017-04-05: qty 2
  Filled 2017-04-05: qty 1
  Filled 2017-04-05 (×2): qty 2

## 2017-04-05 MED ORDER — LOPERAMIDE HCL 2 MG PO CAPS: 2.0000 mg | ORAL_CAPSULE | Freq: Three times a day (TID) | ORAL | Status: DC | PRN

## 2017-04-05 NOTE — Progress Notes (Signed)
4 Days Post-Op   Subjective/Chief Complaint:  1 - Bladder Injury / Right Ureteral Stricture - s/p robotic RIGHT ureteral reimplant with psoas hitch + HM Flap 04/01/17 at time of colostomy take down / adhesiolysis, small bowel resection, loop iliostomy for right distal stricture / bladder injury sustained at pelvic resection for advanced endometrial cancer 2016.   Today "Safari" is stable. Ambulating some in hall. Having ostomy output, minimal JP output, copious urine output via foley.   Objective: Vital signs in last 24 hours: Temp:  [98.6 F (37 C)-99.4 F (37.4 C)] 98.6 F (37 C) (09/04 1500) Pulse Rate:  [86-96] 86 (09/04 1500) Resp:  [18] 18 (09/04 1500) BP: (127-138)/(57-66) 138/62 (09/04 1500) SpO2:  [96 %-98 %] 97 % (09/04 1500) Weight:  [95.7 kg (210 lb 14.4 oz)] 95.7 kg (210 lb 14.4 oz) (09/04 1400) Last BM Date: 04/05/17  Intake/Output from previous day: 09/03 0701 - 09/04 0700 In: 1035 [P.O.:720; Blood:315] Out: 4250 [Urine:3200; Drains:50; Stool:1000] Intake/Output this shift: Total I/O In: 5852 [P.O.:480; IV Piggyback:1310] Out: 7782 [Urine:1150; Drains:10; Stool:625]  General appearance: alert, cooperative and appears stated age Eyes: negative Nose: Nares normal. Septum midline. Mucosa normal. No drainage or sinus tenderness. Throat: lips, mucosa, and tongue normal; teeth and gums normal Neck: supple, symmetrical, trachea midline Back: symmetric, no curvature. ROM normal. No CVA tenderness. Resp: non-labored on room air at present.  Cardio: Nl rate GI: soft, non-tender; bowel sounds normal; no masses,  no organomegaly and iliostomy patent of stool. Old colostomy site c/d/i. Recent port and extraction sites c/d/i. JP with scant serosanguinous putput that is non-foul.  Pelvic: external genitalia normal and foley in place with clear yellow urine. No vaginal discharge.  Extremities: extremities normal, atraumatic, no cyanosis or edema Skin: Skin color, texture, turgor  normal. No rashes or lesions Incision/Wound: as per above. Rt EJ lin in place c/d/i.   Lab Results:   Recent Labs  04/04/17 0252 04/04/17 1344 04/05/17 0317  WBC 8.1  --  4.7  HGB 6.9* 8.3* 7.7*  HCT 20.0* 24.3* 22.7*  PLT 126*  --  157   BMET  Recent Labs  04/04/17 0252 04/05/17 0317  NA 134* 139  K 3.6 3.6  CL 106 108  CO2 23 27  GLUCOSE 156* 145*  BUN 32* 21*  CREATININE 1.10* 0.97  CALCIUM 7.7* 8.1*   PT/INR No results for input(s): LABPROT, INR in the last 72 hours. ABG No results for input(s): PHART, HCO3 in the last 72 hours.  Invalid input(s): PCO2, PO2  Studies/Results: No results found.  Anti-infectives: Anti-infectives    Start     Dose/Rate Route Frequency Ordered Stop   04/02/17 0159  clindamycin (CLEOCIN) IVPB 900 mg     900 mg 100 mL/hr over 30 Minutes Intravenous Every 8 hours 04/02/17 0200 04/02/17 1019   04/01/17 2140  clindamycin (CLEOCIN) 900 mg, gentamicin (GARAMYCIN) 240 mg in sodium chloride 0.9 % 1,000 mL for intraperitoneal lavage  Status:  Discontinued       As needed 04/01/17 2141 04/01/17 2145   04/01/17 1915  clindamycin (CLEOCIN) IVPB 900 mg  Status:  Discontinued     900 mg 100 mL/hr over 30 Minutes Intravenous  Once 04/01/17 1912 04/02/17 0732   04/01/17 0633  clindamycin (CLEOCIN) 900 MG/50ML IVPB    Comments:  [place] Ron   : cabinet override      04/01/17 0633 04/01/17 1844   04/01/17 0606  neomycin (MYCIFRADIN) tablet 1,000 mg  Status:  Discontinued     1,000 mg Oral 3 times per day 04/01/17 0606 04/01/17 0612   04/01/17 0606  metroNIDAZOLE (FLAGYL) tablet 1,000 mg  Status:  Discontinued     1,000 mg Oral 3 times per day 04/01/17 0606 04/01/17 0612   04/01/17 0600  clindamycin (CLEOCIN) 900 mg, gentamicin (GARAMYCIN) 240 mg in sodium chloride 0.9 % 1,000 mL for intraperitoneal lavage  Status:  Discontinued      Intraperitoneal To Surgery 03/31/17 1154 04/01/17 2158   04/01/17 0600  clindamycin (CLEOCIN) IVPB 900 mg      900 mg 100 mL/hr over 30 Minutes Intravenous 30 min pre-op 03/31/17 1157 04/01/17 2006   04/01/17 0600  gentamicin (GARAMYCIN) 340 mg in dextrose 5 % 100 mL IVPB     340 mg 217 mL/hr over 30 Minutes Intravenous 30 min pre-op 03/31/17 1157 04/01/17 0839      Assessment/Plan: 1 - Bladder Injury / Right Ureteral Stricture - doing well POD 4. Continue current foley, JP for now. Will check JP Cr prior to DC to verify same as serum.   Pt has good understanding of plan for home with foley, then cystogram about 3 weeks post-op with hopeful removal if that is favorable.  Will follow.   [facility], Rowan Blaker 04/05/2017

## 2017-04-05 NOTE — Progress Notes (Signed)
Spoke with [name] about placing the PICC this afternoon. She has concerns about the line being placed in either of her arm since she has had a double mastectomy. Had an implanted port in the past approximately 10 years ago in the right chest. She wants to make sure we can get the PICC  Without difficulty if she has scar tissue from the port. Spoke with Dr Excell Seltzer. Will delay PICC placement until patient can speak with Dr Johney Maine in the am.

## 2017-04-05 NOTE — Plan of Care (Signed)
Problem: Food- and Nutrition-Related Knowledge Deficit (NB-1.1) Goal: Nutrition education Formal process to instruct or train a patient/client in a skill or to impart knowledge to help patients/clients voluntarily manage or modify food choices and eating behavior to maintain or improve health. Outcome: Completed/Met Date Met: 04/05/17 Nutrition Education Note  RD consulted for nutrition education regarding a diet status post ileostomy.  RD provided "Ileostomy Nutrition Therapy" handout from the Academy of [employer]. Encouraged patient to keep fiber intake below 8 grams during transition from liquids to solids, and below 13 grams per day as symptoms subside. Encouraged patient to consume refined and white grain foods with less than 2g of fiber per serving as well as soft, well-cooked proteins, and well cooked vegetables without seeds or skin.  RD discussed why it is important to adhere to list of recommended foods, and foods to avoid, to maintain ostomy integrity and decrease risk of gas, diarrhea, and blockage related complications. Encouraged patient to consume 8 to 10 cups of liquid per day and to drink liquids 30 minutes after meals or snacks to prevent flushing.   Provided tips to ensure adequate absorption of medications, vitamins, and minerals. After 6 weeks, as patient's diet returns to normal, encouraged adding 1 new food in per day, for a few days to monitor tolerance.  Expect good compliance. Pt with history of colostomy. Drinking Premier Protein shakes with no issue.   Body mass index is 40.24 kg/m. Pt meets criteria for obesity based on current BMI.   Current diet order is heart healthy, patient is consuming approximately 50% of meals at this time. Labs and medications reviewed. No further nutrition interventions warranted at this time. If additional nutrition issues arise, please re-consult RD  Clayton Bibles, MS, RD, LDN Pager: [phone] After Hours Pager:  [phone]

## 2017-04-05 NOTE — Care Management Note (Signed)
Case Management Note  Patient Details  Name: Levi Crass MRN: [id number] Date of Birth: 1955-12-15  Subjective/Objective:  61 yo admitted with pelvic cancer s/p colostomy takedown/loop ileostomy diversion.                  Action/Plan: From home with spouse. Per CM consult, pt will need IV fluid infusions for [redacted] weeks along with other RN services. Pt offered choice for home health services and [facility] chosen. [facility] IV infusion rep contacted for referral. [facility] to handle all pt RN needs for home. CM will continue to follow and assist as needed.  Expected Discharge Date:                  Expected Discharge Plan:  [place]  In-House Referral:     Discharge planning Services  CM Consult  Post Acute Care Choice:  Home Health Choice offered to:  Patient  DME Arranged:    DME Agency:     HH Arranged:  RN [place] Agency:  [facility]  Status of Service:  In process, will continue to follow  If discussed at Long Length of Stay Meetings, dates discussed:    Additional CommentsLynnell Catalan, RN 04/05/2017, 11:09 AM  [phone]

## 2017-04-05 NOTE — Progress Notes (Addendum)
[address] [place], [place] [address] Phone: [phone]  FAX: [phone]      Jacki Couse [id number] 1955-08-05  CARE TEAM:  PCP: Ann Held, DO  Outpatient Care Team: Patient Care Team: Carollee Herter, Alferd Apa, DO as PCP - General (Family Medicine) Heath Lark, MD as Consulting Physician (Hematology and Oncology) Everitt Amber, MD as Consulting Physician (Obstetrics and Gynecology) Michael [name], MD as Consulting Physician (General Surgery) Alexis Frock, MD as Consulting Physician (Urology) Altheimer, Legrand Como, MD as Consulting Physician (Endocrinology) Katy Apo, MD as Consulting Physician (Ophthalmology) Stark Klein, MD as Consulting Physician (General Surgery) Irene Limbo, MD as Consulting Physician (Plastic Surgery)  Inpatient Treatment Team: Treatment Team: Attending Provider: Michael [name], MD; Consulting Physician: Alexis Frock, MD; Technician: Etheleen Sia, NT; Registered Nurse: Charlyne Petrin, RN   Problem List:   Principal Problem:   Pelvic cancer s/p colostomy takedown/loop ileostomy diversion 04/01/2017 Active Problems:   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 s/p resection/partial vaginectomy/ LAR/colostomy 11/19/2014   Ureteral stricture, right, s/p resection & bladder hitch reimplantation 04/01/2017   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   Primary hypothyroidism   DM type 2 (diabetes mellitus, type 2) ([place])   Benign essential HTN   History of ovarian & endometrial cancer   Endotracheally intubated   Morbid obesity ([place])   Pancytopenia, acquired ([place])   Chronic anemia   Breast cancer of upper-inner quadrant of left female breast ([place])   Vitamin D insufficiency   Postoperative anemia   4 Days Post-Op  04/01/2017  POST-OPERATIVE DIAGNOSIS:    Recurrent endometrial cancer status post low anterior resection and partial pelvic exeneration   Desire for colostomy  takedown RIGHT URETERAL STRICTURE   SURGEON:  [name], MD- Primary (Dr Tresa Moore assist) PROCEDURE:   XI ROBOTIC LYSIS OF ADHESIONS x 5 hours XI ROBOTIC COLOSTOMY TAKEDOWN ROBOTIC SEWN COLOANAL ANASTOMOSIS Ileocecal resection with anastomosis Diverting loop ileostomy  SURGEON: Alexis Frock, MD - Primary (Dr Johney Maine assist) PROCEDURE:   Cystoscopy with R retrograde and stent exchange.  Robotic R ureteral reimplant with psoas bladder hitch.    Assessment  Stabilizing.  Ileus reolving  Plan:  -adv HH solid diet  -acute on chronic anemia - transfused for Hgb drop.  IV iron.  Transfuse PRN Hgb <7.    -DM.  glc control.  PO hypoglycemia.  Usual PO hypoglycemics & SSI backup  -Place PICC line for anticipated six weeks of IV fluid infusions.  Every Monday Wednesday Friday.  PRN IVF bolus backup  -leukocytosis resolved.  Resume weekly Neupogen w h/o chronic neutropenia -thrombocytopenia less - follow platlets -VTE prophylaxis- SCDs, etc.  Resume lovenox but follow Hgb closely  Switch to oral Dilaudid.  Add scheduled Robaxin.  Continue scheduled Tylenol.  Ice/Heat.  Showers.  Hopefully, that will help get her pain under enough control to get up and move.  -mobilize as tolerated to help with recovery  -drain care - keep 10 days minimum.  Output tapering off argues against persistent ureteral leak   Defer urine for creatinine to urology  -foley per Urology - suspect 2-3 weeks minimum w extensive surgery & poor tissues.  Creatinine normalized.  Disposition.  She will need home health care for many issues:  -New ileostomy.  Will need some ostomy training since it is an ileostomy.  She had a prior colostomy, so hopefully not too much of a challenge.  -IV fluid infusions qMWF  for the first six  weeks to counteract high-output ileostomy and avoid readmission for dehydration or kidney failure.  Lactated [place] 1 L over four hours via her PICC line.  Every Monday Wednesday  Friday 6 weeks.  May substitute for normal saline.  -Foley catheter care.  That needs to stay for at least two, perhaps three weeks.  Removal per her urologist, Dr. Phebe Colla with [facility].   -Surgical blake bulb drain in place for the first 10 days.  Okay to remove per urology.   45 minutes spent in review, evaluation, examination, counseling, and coordination of care.  More than 50% of that time was spent in counseling.  I updated the patient's status to the patient and nurse.  Recommendations were made.  Questions were answered.  They expressed understanding & appreciation.   [name], M.D., F.A.C.S. Gastrointestinal and Minimally Invasive Surgery [facility] [address] [place], [place] [address] [phone] Main / Paging   04/05/2017    Subjective: (Chief complaint)  Transferred to floor. Fills odd with oral oxycodone.  Prefers IV Dilaudid. Sore Worried about getting up and walking. Appetite fair but no vomiting.  Objective:  Vital signs:  Vitals:   04/04/17 1150 04/04/17 1601 04/04/17 2119 04/05/17 0640  BP: (!) 130/53 (!) 123/50 (!) 127/57 130/66  Pulse: 84 82 96 86  Resp:  _0 Temp: 99.3 F (37.4 C) 99.6 F (37.6 C) 99.4 F (37.4 C) 98.6 F (37 C)  TempSrc: Oral Oral Oral Oral  SpO2: 100% 99% 96% 98%  Weight:      Height:        Last BM Date: 04/04/17  Intake/Output   Yesterday:  09/03 0701 - 09/04 0700 In: 1035 [P.O.:720; Blood:315] Out: 4250 [Urine:3200; Drains:50; Stool:1000] This shift:  No intake/output data recorded.  Bowel function:  Flatus: YES  BM:  YES - thick succus  Drain: Serosanguinous - more thin   Physical Exam:  General: Pt awake/alert/oriented x4 in no acute distress.  Tired but not toxic Eyes: PERRL, normal EOM.  Sclera clear.  No icterus.  Again with disconjugate gaze Neuro: CN II-XII intact w/o focal sensory/motor deficits. Lymph: No head/neck/groin  lymphadenopathy Psych:  No delerium/psychosis/paranoia HENT: Normocephalic, Mucus membranes moist.  No thrush.  ETT in place Neck: Supple, No tracheal deviation Chest: No chest wall pain w good excursion CV:  Pulses intact.  Regular rhythm MS: Normal AROM mjr joints.  No obvious deformity  Abdomen: Soft.  Nondistended.  Mildly tender at incisions only.  Incisions closed.  Clean dry and intact.  No evidence of peritonitis.  No incarcerated hernias.  RUQ ileostomy less dusky.  ++gas, + liquid stool.  Mild flank bruising  Ext:  No deformity.  No BLE edema.  No cyanosis Skin: No petechiae / purpura  Results:   Labs: Results for orders placed or performed during the hospital encounter of 04/01/17 (from the past 48 hour(s))  Glucose, capillary     Status: Abnormal   Collection Time: 04/03/17  8:28 AM  Result Value Ref Range   Glucose-Capillary 131 (H) 65 - 99 mg/dL   Comment 1 Notify RN    Comment 2 Document in Chart   CBC     Status: Abnormal   Collection Time: 04/03/17 10:15 AM  Result Value Ref Range   WBC 15.2 (H) 4.0 - 10.5 K/uL   RBC 1.96 (L) 3.87 - 5.11 MIL/uL   Hemoglobin 5.7 (LL) 12.0 - 15.0 g/dL  Comment: RESULT REPEATED AND VERIFIED CRITICAL RESULT CALLED TO, READ BACK BY AND VERIFIED WITH: FRAZIER,H AT [id number] BY HOOKER,B TIME OF CALL 1045 DELTA CHECK NOTED DELTA CHECK NOTED    HCT 16.9 (L) 36.0 - 46.0 %   MCV 86.2 78.0 - 100.0 fL   MCH 29.1 26.0 - 34.0 pg   MCHC 33.7 30.0 - 36.0 g/dL   RDW 17.0 (H) 11.5 - 15.5 %   Platelets 136 (L) 150 - 400 K/uL    Comment: RESULT REPEATED AND VERIFIED SPECIMEN CHECKED FOR CLOTS PLATELET COUNT CONFIRMED BY SMEAR DELTA CHECK NOTED   Basic metabolic panel     Status: Abnormal   Collection Time: 04/03/17 10:15 AM  Result Value Ref Range   Sodium 134 (L) 135 - 145 mmol/L   Potassium 3.7 3.5 - 5.1 mmol/L    Comment: DELTA CHECK NOTED   Chloride 106 101 - 111 mmol/L   CO2 20 (L) 22 - 32 mmol/L   Glucose, Bld 148 (H) 65 - 99  mg/dL   BUN 35 (H) 6 - 20 mg/dL   Creatinine, Ser 1.67 (H) 0.44 - 1.00 mg/dL   Calcium 8.0 (L) 8.9 - 10.3 mg/dL   GFR calc non Af Amer 32 (L) >60 mL/min   GFR calc Af Amer 37 (L) >60 mL/min    Comment: (NOTE) The eGFR has been calculated using the CKD EPI equation. This calculation has not been validated in all clinical situations. eGFR's persistently <60 mL/min signify possible Chronic Kidney Disease.    Anion gap 8 5 - 15  Magnesium     Status: None   Collection Time: 04/03/17 10:15 AM  Result Value Ref Range   Magnesium 2.0 1.7 - 2.4 mg/dL  Triglycerides     Status: Abnormal   Collection Time: 04/03/17 10:15 AM  Result Value Ref Range   Triglycerides 158 (H) <150 mg/dL    Comment: Performed at [facility] [address] [place], [place] [address]  CBC with Differential/Platelet     Status: Abnormal   Collection Time: 04/03/17 11:06 AM  Result Value Ref Range   WBC 19.5 (H) 4.0 - 10.5 K/uL   RBC 2.39 (L) 3.87 - 5.11 MIL/uL   Hemoglobin 7.0 (L) 12.0 - 15.0 g/dL   HCT 20.6 (L) 36.0 - 46.0 %   MCV 86.2 78.0 - 100.0 fL   MCH 29.3 26.0 - 34.0 pg   MCHC 34.0 30.0 - 36.0 g/dL   RDW 16.7 (H) 11.5 - 15.5 %   Platelets 179 150 - 400 K/uL   Neutrophils Relative % 89 %   Neutro Abs 17.3 (H) 1.7 - 7.7 K/uL   Lymphocytes Relative 4 %   Lymphs Abs 0.9 0.7 - 4.0 K/uL   Monocytes Relative 7 %   Monocytes Absolute 1.3 (H) 0.1 - 1.0 K/uL   Eosinophils Relative 0 %   Eosinophils Absolute 0.0 0.0 - 0.7 K/uL   Basophils Relative 0 %   Basophils Absolute 0.0 0.0 - 0.1 K/uL  Basic metabolic panel     Status: Abnormal   Collection Time: 04/03/17 11:06 AM  Result Value Ref Range   Sodium 135 135 - 145 mmol/L   Potassium 3.8 3.5 - 5.1 mmol/L   Chloride 107 101 - 111 mmol/L   CO2 22 22 - 32 mmol/L   Glucose, Bld 148 (H) 65 - 99 mg/dL   BUN 35 (H) 6 - 20 mg/dL   Creatinine, Ser 1.67 (H) 0.44 - 1.00  mg/dL   Calcium 8.0 (L) 8.9 - 10.3 mg/dL   GFR calc non Af Amer 32 (L) >60 mL/min    GFR calc Af Amer 37 (L) >60 mL/min    Comment: (NOTE) The eGFR has been calculated using the CKD EPI equation. This calculation has not been validated in all clinical situations. eGFR's persistently <60 mL/min signify possible Chronic Kidney Disease.    Anion gap 6 5 - 15  Glucose, capillary     Status: Abnormal   Collection Time: 04/03/17 12:10 PM  Result Value Ref Range   Glucose-Capillary 139 (H) 65 - 99 mg/dL  Prepare RBC     Status: None   Collection Time: 04/03/17  2:00 PM  Result Value Ref Range   Order Confirmation ORDER PROCESSED BY BLOOD BANK   Glucose, capillary     Status: Abnormal   Collection Time: 04/03/17  3:51 PM  Result Value Ref Range   Glucose-Capillary 159 (H) 65 - 99 mg/dL  Hemoglobin and hematocrit, blood     Status: Abnormal   Collection Time: 04/03/17  9:21 PM  Result Value Ref Range   Hemoglobin 7.5 (L) 12.0 - 15.0 g/dL   HCT 21.9 (L) 36.0 - 46.0 %  Glucose, capillary     Status: Abnormal   Collection Time: 04/03/17  9:33 PM  Result Value Ref Range   Glucose-Capillary 146 (H) 65 - 99 mg/dL  CBC     Status: Abnormal   Collection Time: 04/04/17  2:52 AM  Result Value Ref Range   WBC 8.1 4.0 - 10.5 K/uL   RBC 2.28 (L) 3.87 - 5.11 MIL/uL   Hemoglobin 6.9 (LL) 12.0 - 15.0 g/dL    Comment: REPEATED TO VERIFY CRITICAL RESULT CALLED TO, READ BACK BY AND VERIFIED WITH: Janene Madeira [id number] @ 0517 BY J SCOTTON    HCT 20.0 (L) 36.0 - 46.0 %   MCV 87.7 78.0 - 100.0 fL   MCH 30.3 26.0 - 34.0 pg   MCHC 34.5 30.0 - 36.0 g/dL   RDW 16.6 (H) 11.5 - 15.5 %   Platelets 126 (L) 150 - 400 K/uL  Basic metabolic panel     Status: Abnormal   Collection Time: 04/04/17  2:52 AM  Result Value Ref Range   Sodium 134 (L) 135 - 145 mmol/L   Potassium 3.6 3.5 - 5.1 mmol/L   Chloride 106 101 - 111 mmol/L   CO2 23 22 - 32 mmol/L   Glucose, Bld 156 (H) 65 - 99 mg/dL   BUN 32 (H) 6 - 20 mg/dL   Creatinine, Ser 1.10 (H) 0.44 - 1.00 mg/dL   Calcium 7.7 (L) 8.9 -  10.3 mg/dL   GFR calc non Af Amer 53 (L) >60 mL/min   GFR calc Af Amer >60 >60 mL/min    Comment: (NOTE) The eGFR has been calculated using the CKD EPI equation. This calculation has not been validated in all clinical situations. eGFR's persistently <60 mL/min signify possible Chronic Kidney Disease.    Anion gap 5 5 - 15  Prepare RBC     Status: None   Collection Time: 04/04/17  6:00 AM  Result Value Ref Range   Order Confirmation ORDER PROCESSED BY BLOOD BANK   Glucose, capillary     Status: Abnormal   Collection Time: 04/04/17  8:04 AM  Result Value Ref Range   Glucose-Capillary 137 (H) 65 - 99 mg/dL   Comment 1 Notify RN    Comment 2 Document in Chart  Glucose, capillary     Status: Abnormal   Collection Time: 04/04/17 12:12 PM  Result Value Ref Range   Glucose-Capillary 177 (H) 65 - 99 mg/dL  Hemoglobin and hematocrit, blood     Status: Abnormal   Collection Time: 04/04/17  1:44 PM  Result Value Ref Range   Hemoglobin 8.3 (L) 12.0 - 15.0 g/dL   HCT 24.3 (L) 36.0 - 46.0 %  Glucose, capillary     Status: Abnormal   Collection Time: 04/04/17  4:31 PM  Result Value Ref Range   Glucose-Capillary 116 (H) 65 - 99 mg/dL  Glucose, capillary     Status: Abnormal   Collection Time: 04/04/17  9:55 PM  Result Value Ref Range   Glucose-Capillary 146 (H) 65 - 99 mg/dL  CBC     Status: Abnormal   Collection Time: 04/05/17  3:17 AM  Result Value Ref Range   WBC 4.7 4.0 - 10.5 K/uL   RBC 2.64 (L) 3.87 - 5.11 MIL/uL   Hemoglobin 7.7 (L) 12.0 - 15.0 g/dL   HCT 22.7 (L) 36.0 - 46.0 %   MCV 86.0 78.0 - 100.0 fL   MCH 29.2 26.0 - 34.0 pg   MCHC 33.9 30.0 - 36.0 g/dL   RDW 16.7 (H) 11.5 - 15.5 %   Platelets 157 150 - 400 K/uL  Basic metabolic panel     Status: Abnormal   Collection Time: 04/05/17  3:17 AM  Result Value Ref Range   Sodium 139 135 - 145 mmol/L   Potassium 3.6 3.5 - 5.1 mmol/L   Chloride 108 101 - 111 mmol/L   CO2 27 22 - 32 mmol/L   Glucose, Bld 145 (H) 65 - 99  mg/dL   BUN 21 (H) 6 - 20 mg/dL   Creatinine, Ser 0.97 0.44 - 1.00 mg/dL   Calcium 8.1 (L) 8.9 - 10.3 mg/dL   GFR calc non Af Amer >60 >60 mL/min   GFR calc Af Amer >60 >60 mL/min    Comment: (NOTE) The eGFR has been calculated using the CKD EPI equation. This calculation has not been validated in all clinical situations. eGFR's persistently <60 mL/min signify possible Chronic Kidney Disease.    Anion gap 4 (L) 5 - 15    Imaging / Studies: No results found.  Medications / Allergies: per chart  Antibiotics: Anti-infectives    Start     Dose/Rate Route Frequency Ordered Stop   04/02/17 0159  clindamycin (CLEOCIN) IVPB 900 mg     900 mg 100 mL/hr over 30 Minutes Intravenous Every 8 hours 04/02/17 0200 04/02/17 1019   04/01/17 2140  clindamycin (CLEOCIN) 900 mg, gentamicin (GARAMYCIN) 240 mg in sodium chloride 0.9 % 1,000 mL for intraperitoneal lavage  Status:  Discontinued       As needed 04/01/17 2141 04/01/17 2145   04/01/17 1915  clindamycin (CLEOCIN) IVPB 900 mg  Status:  Discontinued     900 mg 100 mL/hr over 30 Minutes Intravenous  Once 04/01/17 1912 04/02/17 0732   04/01/17 0633  clindamycin (CLEOCIN) 900 MG/50ML IVPB    Comments:  [place] Ron   : cabinet override      04/01/17 0633 04/01/17 1844   04/01/17 0606  neomycin (MYCIFRADIN) tablet 1,000 mg  Status:  Discontinued     1,000 mg Oral 3 times per day 04/01/17 0606 04/01/17 0612   04/01/17 0606  metroNIDAZOLE (FLAGYL) tablet 1,000 mg  Status:  Discontinued     1,000 mg Oral 3 times  per day 04/01/17 0606 04/01/17 0612   04/01/17 0600  clindamycin (CLEOCIN) 900 mg, gentamicin (GARAMYCIN) 240 mg in sodium chloride 0.9 % 1,000 mL for intraperitoneal lavage  Status:  Discontinued      Intraperitoneal To Surgery 03/31/17 1154 04/01/17 2158   04/01/17 0600  clindamycin (CLEOCIN) IVPB 900 mg     900 mg 100 mL/hr over 30 Minutes Intravenous 30 min pre-op 03/31/17 1157 04/01/17 2006   04/01/17 0600  gentamicin (GARAMYCIN)  340 mg in dextrose 5 % 100 mL IVPB     340 mg 217 mL/hr over 30 Minutes Intravenous 30 min pre-op 03/31/17 1157 04/01/17 0839        Note: Portions of this report may have been transcribed using voice recognition software. Every effort was made to ensure accuracy; however, inadvertent computerized transcription errors may be present.   Any transcriptional errors that result from this process are unintentional.     [name], M.D., F.A.C.S. Gastrointestinal and Minimally Invasive Surgery [facility] [address] [place], [place] [address] [phone] Main / Paging   04/05/2017

## 2017-04-06 ENCOUNTER — Encounter (HOSPITAL_COMMUNITY): Payer: Self-pay | Admitting: Radiology

## 2017-04-06 ENCOUNTER — Inpatient Hospital Stay (HOSPITAL_COMMUNITY): Payer: BLUE CROSS/BLUE SHIELD

## 2017-04-06 HISTORY — PX: IR US GUIDE VASC ACCESS RIGHT: IMG2390

## 2017-04-06 HISTORY — PX: IR FLUORO GUIDE CV LINE RIGHT: IMG2283

## 2017-04-06 LAB — CBC
HEMATOCRIT: 21.9 % — AB (ref 36.0–46.0)
Hemoglobin: 7.4 g/dL — ABNORMAL LOW (ref 12.0–15.0)
MCH: 29.6 pg (ref 26.0–34.0)
MCHC: 33.8 g/dL (ref 30.0–36.0)
MCV: 87.6 fL (ref 78.0–100.0)
Platelets: 168 10*3/uL (ref 150–400)
RBC: 2.5 MIL/uL — AB (ref 3.87–5.11)
RDW: 16.5 % — ABNORMAL HIGH (ref 11.5–15.5)
WBC: 2.6 10*3/uL — AB (ref 4.0–10.5)

## 2017-04-06 LAB — BASIC METABOLIC PANEL
ANION GAP: 5 (ref 5–15)
BUN: 16 mg/dL (ref 6–20)
CO2: 27 mmol/L (ref 22–32)
Calcium: 8.2 mg/dL — ABNORMAL LOW (ref 8.9–10.3)
Chloride: 108 mmol/L (ref 101–111)
Creatinine, Ser: 0.79 mg/dL (ref 0.44–1.00)
GFR calc Af Amer: >60 mL/min (ref >60)
GFR calc non Af Amer: >60 mL/min (ref >60)
GLUCOSE: 129 mg/dL — AB (ref 65–99)
POTASSIUM: 3.4 mmol/L — AB (ref 3.5–5.1)
Sodium: 140 mmol/L (ref 135–145)

## 2017-04-06 LAB — GLUCOSE, CAPILLARY
GLUCOSE-CAPILLARY: 144 mg/dL — AB (ref 65–99)
Glucose-Capillary: 129 mg/dL — ABNORMAL HIGH (ref 65–99)
Glucose-Capillary: 127 mg/dL — ABNORMAL HIGH (ref 65–99)
Glucose-Capillary: 103 mg/dL — ABNORMAL HIGH (ref 65–99)

## 2017-04-06 MED ORDER — LOPERAMIDE HCL 2 MG PO CAPS
2.0000 mg | ORAL_CAPSULE | Freq: Every morning | ORAL | Status: DC
  Administered 2017-04-06 – 2017-04-07 (×2): 2 mg via ORAL
  Filled 2017-04-06 (×2): qty 1

## 2017-04-06 MED ORDER — PSYLLIUM 95 % PO PACK
1.0000 | PACK | Freq: Every day | ORAL | Status: DC
  Administered 2017-04-06 – 2017-04-07 (×2): 1 via ORAL
  Filled 2017-04-06 (×2): qty 1

## 2017-04-06 MED ORDER — LOPERAMIDE HCL 2 MG PO CAPS
4.0000 mg | ORAL_CAPSULE | Freq: Every day | ORAL | Status: DC
  Administered 2017-04-06: 4 mg via ORAL
  Filled 2017-04-06: qty 2

## 2017-04-06 MED ORDER — FERROUS SULFATE 325 (65 FE) MG PO TABS
325.0000 mg | ORAL_TABLET | Freq: Two times a day (BID) | ORAL | Status: DC
  Administered 2017-04-06 – 2017-04-07 (×3): 325 mg via ORAL
  Filled 2017-04-06 (×3): qty 1

## 2017-04-06 MED ORDER — LIDOCAINE HCL 1 % IJ SOLN
INTRAMUSCULAR | Status: DC | PRN
  Administered 2017-04-06: 10 mL

## 2017-04-06 MED ORDER — LIDOCAINE HCL (PF) 1 % IJ SOLN
INTRAMUSCULAR | Status: AC
  Filled 2017-04-06: qty 30

## 2017-04-06 NOTE — Progress Notes (Signed)
I Spoke to [place] the triage nurse at [facility]. I ask her to have Dr Johney Maine place an order for Neupogen 480 mcg subq.because she takes this injection every 6 days. Also the IV TEAM wants to PICC line to be place in IR because her dbl mastectomy was 1 year ago. I spoke  to Dr Tonny Branch office to find out about the dose of neupogen

## 2017-04-06 NOTE — Progress Notes (Signed)
[place] pt for [facility] this hospital admission.  [facility] will be providing HHRN and [facility] services for home support with new ileostomy care and IV hydration as ordered by Dr. Johney Maine at [facility].  If patient discharges after hours, please call [phone].   Larry Sierras 04/06/2017, 10:35 AM

## 2017-04-06 NOTE — Progress Notes (Addendum)
[name] [place], [name] Phone: [phone]  FAX: [phone]      [name] [id number] Dec 26, 1955  CARE TEAM:  PCP: Ann Held, DO  Outpatient Care Team: Patient Care Team: Carollee Herter, Alferd Apa, DO as PCP - General (Family Medicine) Heath Lark, MD as Consulting Physician (Hematology and Oncology) Everitt Amber, MD as Consulting Physician (Obstetrics and Gynecology) Michael [name], MD as Consulting Physician (General Surgery) Alexis Frock, MD as Consulting Physician (Urology) Altheimer, Legrand Como, MD as Consulting Physician (Endocrinology) Katy Apo, MD as Consulting Physician (Ophthalmology) Stark Klein, MD as Consulting Physician (General Surgery) Irene Limbo, MD as Consulting Physician (Plastic Surgery)  Inpatient Treatment Team: Treatment Team: Attending Provider: Michael [name], MD; Consulting Physician: Alexis Frock, MD; Technician: Etheleen Sia, NT; Registered Nurse: Suzzanne Cloud, RN   Problem List:   Principal Problem:   Pelvic cancer s/p colostomy takedown/loop ileostomy diversion 04/01/2017 Active Problems:   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 s/p resection/partial vaginectomy/ LAR/colostomy 11/19/2014   Ureteral stricture, right, s/p resection & bladder hitch reimplantation 04/01/2017   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)   Vitamin D insufficiency   Postoperative anemia   5 Days Post-Op  04/01/2017  POST-OPERATIVE DIAGNOSIS:    Recurrent endometrial cancer status post low anterior resection and partial pelvic exeneration   Desire for  colostomy takedown RIGHT URETERAL STRICTURE   SURGEON:  [name], MD- Primary (Dr Tresa Moore assist) PROCEDURE:   XI ROBOTIC LYSIS OF ADHESIONS x 5 hours XI ROBOTIC COLOSTOMY TAKEDOWN ROBOTIC SEWN COLOANAL ANASTOMOSIS Ileocecal resection with anastomosis Diverting loop ileostomy  SURGEON: Alexis Frock, MD - Primary (Dr Johney Maine assist) PROCEDURE:   Cystoscopy with R retrograde and stent exchange.  Robotic R ureteral reimplant with psoas bladder hitch.    Assessment  Stabilizing.  Ileus reolving  Plan:  -HH solid diet  -control diarrhea.  Inc imodium to BID w PRN backup.  Add iron BID.  Add low dose psyllium  -place PICC as ordered.  Patient only had SLN dissection on L & none on right - safe to do.  Benefits outweigh risks.  Place PICC line for anticipated six weeks of IV fluid infusions.  Every Monday Wednesday Friday.  PRN IVF bolus backup  -acute on chronic anemia - transfused for Hgb drop.  IV iron.  Transfuse PRN Hgb <7.    -DM.  glc control.  PO hypoglycemia.  Usual PO hypoglycemics & SSI backup  -leukocytosis resolved.  Resume weekly Neupogen w h/o chronic neutropenia -thrombocytopenia less - follow platlets -VTE prophylaxis- SCDs, etc.  Resume lovenox but follow Hgb closely  Switch to oral Dilaudid.  Add scheduled Robaxin.  Continue scheduled Tylenol.  Ice/Heat.  Showers.  Hopefully, that will help get her pain under enough control to get up and move.  -mobilize as tolerated to help with recovery  -drain care - keep 10 days minimum.  Output tapering off argues against persistent ureteral leak   Defer urine for creatinine to urology  -foley per Urology - suspect 2-3 weeks minimum w extensive surgery & poor tissues.  Creatinine normalized.  Disposition.  Possible d/c  1-2 days if better & HH arranged.  Ask PT & OT to see just in case given complexity.  She will need home health care for many issues:  -New ileostomy.  Will need some ostomy training since it is  an ileostomy.  She had a prior colostomy, so hopefully not too much of a challenge.  -IV fluid infusions qMWF  for the first six weeks to counteract high-output ileostomy and avoid readmission for dehydration or kidney failure.  Lactated Ringers 1 L over four hours via her PICC line.  Every Monday Wednesday Friday 6 weeks.  May substitute for normal saline.  -Foley catheter care.  That needs to stay for at least two, perhaps three weeks.  Removal per her urologist, Dr. Phebe Colla with [facility].   -Surgical blake bulb drain in place for the first 10 days.  Okay to remove per urology.   44mnutes spent in review, evaluation, examination, counseling, and coordination of care.  More than 50% of that time was spent in counseling.  I updated the patient's status to the patient and nurse.  Recommendations were made.  Questions were answered.  They expressed understanding & appreciation.   SAdin Hector M.D., F.A.C.S. Gastrointestinal and [facility]imally Invasive [facility] [address] [place] [phone] / Paging   04/06/2017    Subjective: (Chief complaint)  Prefers Dilaudid. Worried about PICC Appetite fair but no vomiting.  Objective:  Vital signs:  Vitals:   04/05/17 1400 04/05/17 1500 04/05/17 2049 04/06/17 0608  BP:  138/62 (!) 132/59 (!) 138/50  Pulse:  86 79 83  Resp:  18 18 16   Temp:  98.6 F (37 C) 98.7 F (37.1 C) 98.8 F (37.1 C)  TempSrc:  Oral Oral Oral  SpO2:  97% 100% 97%  Weight: 95.7 kg (210 lb 14.4 oz)     Height:        Last BM Date: 04/05/17  Intake/Output   Yesterday:  09/04 0701 - 09/05 0700 In: 2063 [P.O.:740; I.V.:13; IV Piggyback:1310] Out: 4485 [Urine:2850; Drains:35; SINOMV:6720]This shift:  No intake/output data recorded.  Bowel function:  Flatus: YES  BM:  YES - thick succus  Drain: Serosanguinous - more thin   Physical Exam:  General: Pt  awake/alert/oriented x4 in no acute distress.  Tired but not toxic Eyes: PERRL, normal EOM.  Sclera clear.  No icterus.  Again with disconjugate gaze Neuro: CN II-XII intact w/o focal sensory/motor deficits. Lymph: No head/neck/groin lymphadenopathy Psych:  No delerium/psychosis/paranoia HENT: Normocephalic, Mucus membranes moist.  No thrush.  ETT in place Neck: Supple, No tracheal deviation Chest: No chest wall pain w good excursion CV:  Pulses intact.  Regular rhythm MS: Normal AROM mjr joints.  No obvious deformity  Abdomen: Soft.  Nondistended.  Mildly tender at incisions only.  Incisions closed.  Clean dry and intact.  No evidence of peritonitis.  No incarcerated hernias.  RUQ ileostomy less dusky.  ++gas, + liquid stool.  Mild flank bruising  Ext:  No deformity.  No BLE edema.  No cyanosis Skin: No petechiae / purpura  Results:   Labs: Results for orders placed or performed during the hospital encounter of 04/01/17 (from the past 48 hour(s))  Glucose, capillary     Status: Abnormal   Collection Time: 04/04/17  8:04 AM  Result Value Ref Range   Glucose-Capillary 137 (H) 65 - 99 mg/dL   Comment 1 Notify RN    Comment  2 Document in Chart   Glucose, capillary     Status: Abnormal   Collection Time: 04/04/17 12:12 PM  Result Value Ref Range   Glucose-Capillary 177 (H) 65 - 99 mg/dL  Hemoglobin and hematocrit, blood     Status: Abnormal   Collection Time: 04/04/17  1:44 PM  Result Value Ref Range   Hemoglobin 8.3 (L) 12.0 - 15.0 g/dL   HCT 24.3 (L) 36.0 - 46.0 %  Glucose, capillary     Status: Abnormal   Collection Time: 04/04/17  4:31 PM  Result Value Ref Range   Glucose-Capillary 116 (H) 65 - 99 mg/dL  Glucose, capillary     Status: Abnormal   Collection Time: 04/04/17  9:55 PM  Result Value Ref Range   Glucose-Capillary 146 (H) 65 - 99 mg/dL  CBC     Status: Abnormal   Collection Time: 04/05/17  3:17 AM  Result Value Ref Range   WBC 4.7 4.0 - 10.5 K/uL   RBC 2.64 (L)  3.87 - 5.11 MIL/uL   Hemoglobin 7.7 (L) 12.0 - 15.0 g/dL   HCT 22.7 (L) 36.0 - 46.0 %   MCV 86.0 78.0 - 100.0 fL   MCH 29.2 26.0 - 34.0 pg   MCHC 33.9 30.0 - 36.0 g/dL   RDW 16.7 (H) 11.5 - 15.5 %   Platelets 157 150 - 400 K/uL  Basic metabolic panel     Status: Abnormal   Collection Time: 04/05/17  3:17 AM  Result Value Ref Range   Sodium 139 135 - 145 mmol/L   Potassium 3.6 3.5 - 5.1 mmol/L   Chloride 108 101 - 111 mmol/L   CO2 27 22 - 32 mmol/L   Glucose, Bld 145 (H) 65 - 99 mg/dL   BUN 21 (H) 6 - 20 mg/dL   Creatinine, Ser 0.97 0.44 - 1.00 mg/dL   Calcium 8.1 (L) 8.9 - 10.3 mg/dL   GFR calc non Af Amer >60 >60 mL/min   GFR calc Af Amer >60 >60 mL/min    Comment: (NOTE) The eGFR has been calculated using the CKD EPI equation. This calculation has not been validated in all clinical situations. eGFR's persistently <60 mL/min signify possible Chronic Kidney Disease.    Anion gap 4 (L) 5 - 15  Glucose, capillary     Status: Abnormal   Collection Time: 04/05/17  7:47 AM  Result Value Ref Range   Glucose-Capillary 171 (H) 65 - 99 mg/dL  Glucose, capillary     Status: Abnormal   Collection Time: 04/05/17 11:52 AM  Result Value Ref Range   Glucose-Capillary 133 (H) 65 - 99 mg/dL  Glucose, capillary     Status: Abnormal   Collection Time: 04/05/17  4:11 PM  Result Value Ref Range   Glucose-Capillary 162 (H) 65 - 99 mg/dL  Glucose, capillary     Status: Abnormal   Collection Time: 04/05/17  9:33 PM  Result Value Ref Range   Glucose-Capillary 127 (H) 65 - 99 mg/dL  CBC     Status: Abnormal   Collection Time: 04/06/17  5:38 AM  Result Value Ref Range   WBC 2.6 (L) 4.0 - 10.5 K/uL   RBC 2.50 (L) 3.87 - 5.11 MIL/uL   Hemoglobin 7.4 (L) 12.0 - 15.0 g/dL   HCT 21.9 (L) 36.0 - 46.0 %   MCV 87.6 78.0 - 100.0 fL   MCH 29.6 26.0 - 34.0 pg   MCHC 33.8 30.0 - 36.0 g/dL   RDW 16.5 (  H) 11.5 - 15.5 %   Platelets 168 150 - 400 K/uL  Basic metabolic panel     Status: Abnormal    Collection Time: 04/06/17  5:38 AM  Result Value Ref Range   Sodium 140 135 - 145 mmol/L   Potassium 3.4 (L) 3.5 - 5.1 mmol/L   Chloride 108 101 - 111 mmol/L   CO2 27 22 - 32 mmol/L   Glucose, Bld 129 (H) 65 - 99 mg/dL   BUN 16 6 - 20 mg/dL   Creatinine, Ser 0.79 0.44 - 1.00 mg/dL   Calcium 8.2 (L) 8.9 - 10.3 mg/dL   GFR calc non Af Amer >60 >60 mL/min   GFR calc Af Amer >60 >60 mL/min    Comment: (NOTE) The eGFR has been calculated using the CKD EPI equation. This calculation has not been validated in all clinical situations. eGFR's persistently <60 mL/min signify possible Chronic Kidney Disease.    Anion gap 5 5 - 15  Glucose, capillary     Status: Abnormal   Collection Time: 04/06/17  7:21 AM  Result Value Ref Range   Glucose-Capillary 129 (H) 65 - 99 mg/dL    Imaging / Studies: No results found.  Medications / Allergies: per chart  Antibiotics: Anti-infectives    Start     Dose/Rate Route Frequency Ordered Stop   04/02/17 0159  clindamycin (CLEOCIN) IVPB 900 mg     900 mg 100 mL/hr over 30 Minutes Intravenous Every 8 hours 04/02/17 0200 04/02/17 1019   04/01/17 2140  clindamycin (CLEOCIN) 900 mg, gentamicin (GARAMYCIN) 240 mg in sodium chloride 0.9 % 1,000 mL for intraperitoneal lavage  Status:  Discontinued       As needed 04/01/17 2141 04/01/17 2145   04/01/17 1915  clindamycin (CLEOCIN) IVPB 900 mg  Status:  Discontinued     900 mg 100 mL/hr over 30 Minutes Intravenous  Once 04/01/17 1912 04/02/17 0732   04/01/17 0633  clindamycin (CLEOCIN) 900 MG/50ML IVPB    Comments:  [place], Ron   : cabinet override      04/01/17 0633 04/01/17 1844   04/01/17 0606  neomycin (MYCIFRADIN) tablet 1,000 mg  Status:  Discontinued     1,000 mg Oral 3 times per day 04/01/17 0606 04/01/17 0612   04/01/17 0606  metroNIDAZOLE (FLAGYL) tablet 1,000 mg  Status:  Discontinued     1,000 mg Oral 3 times per day 04/01/17 0606 04/01/17 0612   04/01/17 0600  clindamycin (CLEOCIN) 900 mg,  gentamicin (GARAMYCIN) 240 mg in sodium chloride 0.9 % 1,000 mL for intraperitoneal lavage  Status:  Discontinued      Intraperitoneal To Surgery 03/31/17 1154 04/01/17 2158   04/01/17 0600  clindamycin (CLEOCIN) IVPB 900 mg     900 mg 100 mL/hr over 30 Minutes Intravenous 30 min pre-op 03/31/17 1157 04/01/17 2006   04/01/17 0600  gentamicin (GARAMYCIN) 340 mg in dextrose 5 % 100 mL IVPB     340 mg 217 mL/hr over 30 Minutes Intravenous 30 min pre-op 03/31/17 1157 04/01/17 0839        Note: Portions of this report may have been transcribed using voice recognition software. Every effort was made to ensure accuracy; however, inadvertent computerized transcription errors may be present.   Any transcriptional errors that result from this process are unintentional.     [name], M.D., F.A.C.S. Gastrointestinal and Minimally Invasive Surgery [facility] [address], [place], [place] [address] [phone] Main /  Paging   04/06/2017

## 2017-04-06 NOTE — Progress Notes (Signed)
MEDICATION-RELATED CONSULT NOTE   IR Procedure Consult - Anticoagulant/Antiplatelet PTA/Inpatient Med List Review by Pharmacist    Procedure: Ultrasound/fluoroscopic guided placement of right double-lumen brachial vein PICC    Completed: 04/06/17 1438  Post-Procedural bleeding risk per IR MD assessment:  standard  Antithrombotic medications on inpatient or PTA profile prior to procedure:   Lovenox 40 mg SQ q24h    Recommended restart time per IR Post-Procedure Guidelines:  Next AM   Other considerations:   Hgb 7.4, surgery notes to hold if Hgb<7   Plan:    Resume Lovenox 40 mg SQ q24h tomorrow morning

## 2017-04-06 NOTE — Procedures (Signed)
Ultrasound/fluoroscopic guided placement of right double-lumen brachial vein PICC. Length 35 cm. Tip SVC/right atrial junction. Medication used-1% lidocaine to skin and subcutaneous tissue. Okay to use.

## 2017-04-07 LAB — BASIC METABOLIC PANEL
Anion gap: 8 (ref 5–15)
BUN: 15 mg/dL (ref 6–20)
CO2: 28 mmol/L (ref 22–32)
Calcium: 8.5 mg/dL — ABNORMAL LOW (ref 8.9–10.3)
Chloride: 107 mmol/L (ref 101–111)
Creatinine, Ser: 0.99 mg/dL (ref 0.44–1.00)
GFR calc Af Amer: >60 mL/min (ref >60)
GFR, EST NON AFRICAN AMERICAN: 60 mL/min — AB (ref >60)
Glucose, Bld: 108 mg/dL — ABNORMAL HIGH (ref 65–99)
POTASSIUM: 3.4 mmol/L — AB (ref 3.5–5.1)
SODIUM: 143 mmol/L (ref 135–145)

## 2017-04-07 LAB — CBC
HEMATOCRIT: 23.5 % — AB (ref 36.0–46.0)
Hemoglobin: 7.8 g/dL — ABNORMAL LOW (ref 12.0–15.0)
MCH: 30.1 pg (ref 26.0–34.0)
MCHC: 33.2 g/dL (ref 30.0–36.0)
MCV: 90.7 fL (ref 78.0–100.0)
Platelets: 225 10*3/uL (ref 150–400)
RBC: 2.59 MIL/uL — ABNORMAL LOW (ref 3.87–5.11)
RDW: 16.5 % — ABNORMAL HIGH (ref 11.5–15.5)
WBC: 15 10*3/uL — AB (ref 4.0–10.5)

## 2017-04-07 LAB — GLUCOSE, CAPILLARY
GLUCOSE-CAPILLARY: 115 mg/dL — AB (ref 65–99)
GLUCOSE-CAPILLARY: 133 mg/dL — AB (ref 65–99)
Glucose-Capillary: 115 mg/dL — ABNORMAL HIGH (ref 65–99)

## 2017-04-07 LAB — CREATININE, FLUID (PLEURAL, PERITONEAL, JP DRAINAGE): Creat, Fluid: 1 mg/dL

## 2017-04-07 LAB — MAGNESIUM: MAGNESIUM: 1 mg/dL — AB (ref 1.7–2.4)

## 2017-04-07 MED ORDER — HEPARIN SOD (PORK) LOCK FLUSH 100 UNIT/ML IV SOLN
500.0000 [IU] | Freq: Once | INTRAVENOUS | Status: AC
  Administered 2017-04-07: 500 [IU] via INTRAVENOUS
  Filled 2017-04-07: qty 5

## 2017-04-07 MED ORDER — HYDROMORPHONE HCL 2 MG PO TABS: 2.0000 mg | ORAL_TABLET | ORAL | 0 refills | Status: DC | PRN

## 2017-04-07 MED ORDER — LOPERAMIDE HCL 2 MG PO CAPS: 2.0000 mg | ORAL_CAPSULE | Freq: Three times a day (TID) | ORAL | 0 refills | Status: DC | PRN

## 2017-04-07 MED ORDER — POTASSIUM CHLORIDE CRYS ER 20 MEQ PO TBCR
40.0000 meq | EXTENDED_RELEASE_TABLET | Freq: Every day | ORAL | Status: DC
  Administered 2017-04-07: 40 meq via ORAL
  Filled 2017-04-07: qty 2

## 2017-04-07 NOTE — Progress Notes (Signed)
Patient discharged to home with family, discharge instructions reviewed with patient who verbalized understanding. New RX given to patient. PICC line flushed with NS and Heparin.

## 2017-04-07 NOTE — Progress Notes (Signed)
6 Days Post-Op   Subjective/Chief Complaint:  1 - Bladder Injury / Right Ureteral Stricture - s/p robotic RIGHT ureteral reimplant with psoas hitch + HM Flap 04/01/17 at time of colostomy take down / adhesiolysis, small bowel resection, loop iliostomy for right distal stricture / bladder injury sustained at pelvic resection for advanced endometrial cancer 2016. Drain removed 9/6 as Cr same as serum and output scant.   Today "[place]" is stable. Tolerating PO, pain controlled, PICC placed, likely DC later today. JP Cr same as serum.   Objective: Vital signs in last 24 hours: Temp:  [98.7 F (37.1 C)-98.8 F (37.1 C)] 98.8 F (37.1 C) (09/06 0828) Pulse Rate:  [86-95] 86 (09/06 0828) Resp:  [16] 16 (09/06 0828) BP: (127-154)/(60-67) 127/61 (09/06 0828) SpO2:  [96 %-97 %] 97 % (09/06 0828) Weight:  [95.3 kg (210 lb)-96.3 kg (212 lb 6.4 oz)] 96.3 kg (212 lb 6.4 oz) (09/06 1200) Last BM Date: 04/07/17  Intake/Output from previous day: 09/05 0701 - 09/06 0700 In: 240 [P.O.:240] Out: 2655 [Urine:2025; Drains:55; Stool:575] Intake/Output this shift: Total I/O In: 720 [P.O.:720] Out: 1410 [Urine:750; Drains:10; Stool:650]  General appearance: alert, cooperative, appears stated age and family at bedside Eyes: negative, wearing glasses Nose: Nares normal. Septum midline. Mucosa normal. No drainage or sinus tenderness. Throat: lips, mucosa, and tongue normal; teeth and gums normal Back: symmetric, no curvature. ROM normal. No CVA tenderness. Resp: non-labored on room air.  Cardio: Nl rate GI: soft, non-tender; bowel sounds normal; no masses,  no organomegaly and iliostomy patent of copious liquid stool. JP with serous outptu that is non foul, removed and dry dressing applied.  Extremities: extremities normal, atraumatic, no cyanosis or edema Pulses: 2+ and symmetric Lymph nodes: Cervical, supraclavicular, and axillary nodes normal. Neurologic: Grossly normal Incision/Wound: surgical sites  c/d/i. Foley in place with yellow urine.   Lab Results:   Recent Labs  04/06/17 0538 04/07/17 0410  WBC 2.6* 15.0*  HGB 7.4* 7.8*  HCT 21.9* 23.5*  PLT 168 225   BMET  Recent Labs  04/06/17 0538 04/07/17 0410  NA 140 143  K 3.4* 3.4*  CL 108 107  CO2 27 28  GLUCOSE 129* 108*  BUN 16 15  CREATININE 0.79 0.99  CALCIUM 8.2* 8.5*   PT/INR No results for input(s): LABPROT, INR in the last 72 hours. ABG No results for input(s): PHART, HCO3 in the last 72 hours.  Invalid input(s): PCO2, PO2  Studies/Results: Ir Fluoro Guide Cv Line Right  Result Date: 04/06/2017 INDICATION: Patient with history of recurrent endometrial cancer; status post resection, partial vaginectomy, low anterior resection, colostomy 11/19/14; status post colostomy takedown/loop ileostomy diversion 04/01/17; central venous access requested for fluid administration. EXAM: RIGHT UPPER EXTREMITY PICC LINE PLACEMENT WITH ULTRASOUND AND FLUOROSCOPIC GUIDANCE MEDICATIONS: 1% lidocaine to skin and subcutaneous tissue ANESTHESIA/SEDATION: None FLUOROSCOPY TIME:  Fluoroscopy Time:  42 seconds (6 mGy). COMPLICATIONS: None immediate. PROCEDURE: The right arm was prepped with chlorhexidine, draped in the usual sterile fashion using maximum barrier technique (cap and mask, sterile gown, sterile gloves, large sterile sheet, hand hygiene and cutaneous antisepsis) and infiltrated locally with 1% Lidocaine. Ultrasound demonstrated patency of the right brachial vein, and this was documented with an image. Under real-time ultrasound guidance, this vein was accessed with a 21 gauge micropuncture needle and image documentation was performed. A 0.018 wire was introduced in to the vein. Over this, a 5 [place] double lumen power injectable PICC was advanced to the lower SVC/right atrial junction. Fluoroscopy  during the procedure and fluoro spot radiograph confirms appropriate catheter position. The catheter was flushed and covered with a  sterile dressing. Catheter length:  35 cm IMPRESSION: Successful right arm Power PICC line placement with ultrasound and fluoroscopic guidance. The catheter is ready for use. Read by: Rowe Robert, PA-C Electronically Signed   By: Corrie Mckusick D.O.   On: 04/06/2017 14:45   Ir US Guide Vasc Access Right  Result Date: 04/06/2017 INDICATION: Patient with history of recurrent endometrial cancer; status post resection, partial vaginectomy, low anterior resection, colostomy 11/19/14; status post colostomy takedown/loop ileostomy diversion 04/01/17; central venous access requested for fluid administration. EXAM: RIGHT UPPER EXTREMITY PICC LINE PLACEMENT WITH ULTRASOUND AND FLUOROSCOPIC GUIDANCE MEDICATIONS: 1% lidocaine to skin and subcutaneous tissue ANESTHESIA/SEDATION: None FLUOROSCOPY TIME:  Fluoroscopy Time:  42 seconds (6 mGy). COMPLICATIONS: None immediate. PROCEDURE: The right arm was prepped with chlorhexidine, draped in the usual sterile fashion using maximum barrier technique (cap and mask, sterile gown, sterile gloves, large sterile sheet, hand hygiene and cutaneous antisepsis) and infiltrated locally with 1% Lidocaine. Ultrasound demonstrated patency of the right brachial vein, and this was documented with an image. Under real-time ultrasound guidance, this vein was accessed with a 21 gauge micropuncture needle and image documentation was performed. A 0.018 wire was introduced in to the vein. Over this, a 5 [place] double lumen power injectable PICC was advanced to the lower SVC/right atrial junction. Fluoroscopy during the procedure and fluoro spot radiograph confirms appropriate catheter position. The catheter was flushed and covered with a sterile dressing. Catheter length:  35 cm IMPRESSION: Successful right arm Power PICC line placement with ultrasound and fluoroscopic guidance. The catheter is ready for use. Read by: Rowe Robert, PA-C Electronically Signed   By: Corrie Mckusick D.O.   On: 04/06/2017  14:45    Anti-infectives: Anti-infectives    Start     Dose/Rate Route Frequency Ordered Stop   04/02/17 0159  clindamycin (CLEOCIN) IVPB 900 mg     900 mg 100 mL/hr over 30 Minutes Intravenous Every 8 hours 04/02/17 0200 04/02/17 1019   04/01/17 2140  clindamycin (CLEOCIN) 900 mg, gentamicin (GARAMYCIN) 240 mg in sodium chloride 0.9 % 1,000 mL for intraperitoneal lavage  Status:  Discontinued       As needed 04/01/17 2141 04/01/17 2145   04/01/17 1915  clindamycin (CLEOCIN) IVPB 900 mg  Status:  Discontinued     900 mg 100 mL/hr over 30 Minutes Intravenous  Once 04/01/17 1912 04/02/17 0732   04/01/17 0633  clindamycin (CLEOCIN) 900 MG/50ML IVPB    Comments:  [place], Ron   : cabinet override      04/01/17 0633 04/01/17 1844   04/01/17 0606  neomycin (MYCIFRADIN) tablet 1,000 mg  Status:  Discontinued     1,000 mg Oral 3 times per day 04/01/17 0606 04/01/17 0612   04/01/17 0606  metroNIDAZOLE (FLAGYL) tablet 1,000 mg  Status:  Discontinued     1,000 mg Oral 3 times per day 04/01/17 0606 04/01/17 0612   04/01/17 0600  clindamycin (CLEOCIN) 900 mg, gentamicin (GARAMYCIN) 240 mg in sodium chloride 0.9 % 1,000 mL for intraperitoneal lavage  Status:  Discontinued      Intraperitoneal To Surgery 03/31/17 1154 04/01/17 2158   04/01/17 0600  clindamycin (CLEOCIN) IVPB 900 mg     900 mg 100 mL/hr over 30 Minutes Intravenous 30 min pre-op 03/31/17 1157 04/01/17 2006   04/01/17 0600  gentamicin (GARAMYCIN) 340 mg in dextrose 5 % 100 mL  IVPB     340 mg 217 mL/hr over 30 Minutes Intravenous 30 min pre-op 03/31/17 1157 04/01/17 0839      Assessment/Plan:  Pt OK for DC from [place] perspective. Current foley to remain. We will arrange for office cystogram in few weeks.   [facility], Tamesha Ellerbrock 04/07/2017

## 2017-04-07 NOTE — Discharge Summary (Signed)
Physician Discharge Summary  Patient ID: [name] MRN: [id number] DOB/AGE: [birth year]  61 y.o.  Admit date: 04/01/2017 Discharge date: 04/07/2017   Patient Care Team: Zola Button, Grayling Congress, DO as PCP - General (Family Medicine) Artis Delay, MD as Consulting Physician (Hematology and Oncology) Adolphus Birchwood, MD as Consulting Physician (Obstetrics and Gynecology) Karie Soda, MD as Consulting Physician (General Surgery) Sebastian Ache, MD as Consulting Physician (Urology) Altheimer, Casimiro Needle, MD as Consulting Physician (Endocrinology) Antony Contras, MD as Consulting Physician (Ophthalmology) Almond Lint, MD as Consulting Physician (General Surgery) Glenna Fellows, MD as Consulting Physician (Plastic Surgery)  Discharge Diagnoses:  Principal Problem:   Pelvic cancer s/p colostomy takedown/loop ileostomy diversion 04/01/2017 Active Problems:   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 s/p resection/partial vaginectomy/ LAR/colostomy 11/19/2014   Ureteral stricture, right, s/p resection & bladder hitch reimplantation 04/01/2017   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   Primary hypothyroidism   DM type 2 (diabetes mellitus, type 2) (HCC)   Benign essential HTN   History of ovarian & endometrial cancer   Endotracheally intubated   Morbid obesity (HCC)   Pancytopenia, acquired (HCC)   Chronic anemia   Breast cancer of upper-inner quadrant of left female breast (HCC)   Vitamin D insufficiency   Postoperative anemia   POST-OPERATIVE DIAGNOSIS:   Recurrent endometrial cancer status post low anterior resection and partial pelvic exeneration  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   SURGEON: Ardeth Sportsman, MD- Primary (Dr Berneice Heinrich assist) PROCEDURE:  XI ROBOTIC LYSIS OF ADHESIONS x 5 hours XI ROBOTIC COLOSTOMY TAKEDOWN ROBOTIC SEWN COLOANAL ANASTOMOSIS Ileocecal resection with anastomosis Diverting loop ileostomy  SURGEON: Sebastian Ache, MD - Primary  (Dr Michaell Cowing assist) PROCEDURE:  Cystoscopy with R retrograde and stent exchange.  Robotic R ureteral reimplant with psoas bladder hitch.  Consults: urology  Hospital Course:   The patient underwent the surgery above.  She was kept intubated overnight given the prolonged case.  Extubated the following evening.Postoperatively, the patient gradually mobilized and advanced to a solid diet.  Pain and other symptoms were treated aggressively.  She had issues of acute postoperative anemia on top of chronic neutropenia and thrombocytopenia and anemia.  Received transfusion a few times and improved.  Her ileus resolved. She was advanced on her diet.  Transferred floor.  Mobilized.  Ostomy training giving for ileostomy.  Antidiarrheals given to help slow down her ileostomy output.  By the time of discharge, the patient was walking well the hallways, eating food, having flatus.  Pain was well-controlled on an oral medications.  Surgical blake bulb drain had normal creatinine.  Was removed postoperative day #6, the day of d/c by her urologist, Dr. Berneice Heinrich.  I agreed.  Based on meeting discharge criteria and continuing to recover, I felt it was safe for the patient to be discharged from the hospital to further recover with close followup.  She will need home health care for many issues:  -New ileostomy.  Will need some ostomy training since it is an ileostomy.  She had a prior colostomy, so hopefully not too much of a challenge.  -IV fluid infusions qMWF  for the first six weeks to counteract high-output ileostomy and avoid readmission for dehydration or kidney failure.  Lactated Ringers 1 L over four hours via her PICC line.  Every Monday Wednesday Friday 6 weeks.  May substitute for normal saline.  -Foley catheter care.  That needs to stay for at least two, perhaps three weeks.  Removal per her urologist, Dr.  Dwana Curd with [facility].   -   Postoperative recommendations were discussed in detail.   They are written as well.  Discharged Condition: fair  Disposition:  Follow-up Information    Karie Soda, MD. Schedule an appointment as soon as possible for a visit in 2 week(s).   Specialty:  General Surgery Contact information: [address] [place] [address] [phone]        Sebastian Ache, MD. Schedule an appointment as soon as possible for a visit in 2 week(s).   Specialty:  Urology Contact information: [address] [place] [address] [place] [address] [phone]           01-Home or Self Care    Allergies as of 04/07/2017      Reactions   Penicillins Swelling   Facial swelling Has patient had a PCN reaction causing immediate rash, facial/tongue/throat swelling, SOB or lightheadedness with hypotension: Yes Has patient had a PCN reaction causing severe rash involving mucus membranes or skin necrosis: Yes Has patient had a PCN reaction that required hospitalization No Has patient had a PCN reaction occurring within the last 10 years: No If all of the above answers are "NO", then may proceed with Cephalosporin use.   Ultram [tramadol] Hives   Adhesive [tape]    blisters   Cefaclor Rash   Ceclor   Erythromycin    Gastritis, abd cramps   Trimethoprim Rash   Ciprofloxacin Other (See Comments)   Unknown On Dr notes    Oxycodone    " I just feel weird"   Pectin Rash   Pectin ring for stoma   Sulfa Antibiotics Rash      Medication List    TAKE these medications   anastrozole 1 MG tablet Commonly known as:  ARIMIDEX TAKE 1 TABLET DAILY   aspirin EC 81 MG tablet Take 81 mg by mouth daily.   Biotin 5 MG Tabs Take 5 mg by mouth every morning.   Calcium-Magnesium-Vitamin D 400-166.7-133.3 MG-MG-UNIT Tabs Take 1 tablet by mouth daily.   diphenhydrAMINE 25 MG tablet Commonly known as:  BENADRYL Take 25 mg by mouth at bedtime as needed for itching or sleep. Reported on 09/15/2015   filgrastim 480 MCG/1.6ML injection Commonly  known as:  NEUPOGEN Inject 1.6 ml under the skin every 5 days for life What changed:  how much to take  how to take this  when to take this  additional instructions   HYDROmorphone 2 MG tablet Commonly known as:  DILAUDID Take 1-2 tablets (2-4 mg total) by mouth every 4 (four) hours as needed for moderate pain or severe pain.   levothyroxine 150 MCG tablet Commonly known as:  SYNTHROID, LEVOTHROID Take 150 mcg by mouth daily.   loperamide 2 MG capsule Commonly known as:  IMODIUM Take 1-2 capsules (2-4 mg total) by mouth every 8 (eight) hours as needed for diarrhea or loose stools (Use if >2 BM every 8 hours).   loratadine 10 MG tablet Commonly known as:  CLARITIN Take 10 mg by mouth daily.   losartan 100 MG tablet Commonly known as:  COZAAR Take 1 tablet (100 mg total) by mouth daily.   metFORMIN 1000 MG tablet Commonly known as:  GLUCOPHAGE Take 1,000 mg by mouth 2 (two) times daily with a meal.   omega-3 acid ethyl esters 1 g capsule Commonly known as:  LOVAZA Take 1 g by mouth 2 (two) times daily.   PRENATAL VITAMIN PO Take 1 capsule by  mouth daily. Takes prenatal because there are no dyes in it   rosuvastatin 10 MG tablet Commonly known as:  CRESTOR Take 10 mg by mouth every evening.   sitaGLIPtin 100 MG tablet Commonly known as:  JANUVIA Take 100 mg by mouth every morning.   SYSTANE OP Place 1 drop into both eyes at bedtime.   Vitamin D3 10000 units capsule Take 10,000 Units by mouth once a week. _0 /06/18 1253   04/07/17 0000  May shower / Bathe    Comments:  Wash / shower every day.  You may shower over the dressings as they are waterproof.  Continue to shower over incision(s) after the dressing is off.   04/07/17 1253   04/07/17 0000  Driving Restrictions    Comments:  You may drive when you are no longer taking prescription pain medication, you can comfortably wear a seatbelt, and you can safely maneuver your car and apply brakes.   04/07/17 1253   04/07/17 0000  Lifting restrictions    Comments:  You may  resume regular (light) daily activities beginning the next day-such as daily self-care, walking, climbing stairs-gradually increasing activities as tolerated.   If you can walk 30 minutes without difficulty, it is safe to try more intense activity  such as jogging, treadmill, bicycling, low-impact aerobics, swimming, etc. Save the most intensive and strenuous activity for last such as sit-ups, heavy lifting, contact sports, etc   Refrain from any heavy lifting or straining until you are off narcotics for pain control.   DO NOT PUSH THROUGH PAIN.   Let pain be your guide: If it hurts to do something, don't do it.   Pain is your body warning you to avoid that activity for another week until the pain goes down.   04/07/17 1253   04/07/17 0000  Sexual Activity Restrictions    Comments:  You may have sexual intercourse when it is comfortable. If it hurts to do something, stop.   04/07/17 1253   04/07/17 0000  Call MD for:    Comments:  FEVER >101.5 F (Temperatures <101.21F occasionally happen and are not significant)   04/07/17 1253   04/07/17 0000  Call MD for:  persistant nausea and vomiting     04/07/17 1253   04/07/17 0000  Call MD for:  severe uncontrolled pain     04/07/17 1253   04/07/17 0000  Call MD for:  redness, tenderness, or signs of infection (pain, swelling, redness, odor or green/yellow discharge around incision site)     04/07/17 1253   04/07/17 0000  Call MD for:  persistant dizziness or light-headedness     04/07/17 1253   04/07/17 0000  Call MD for:  extreme fatigue     04/07/17 1253   04/07/17 0000  Remove dressing in 72 hours    Comments:  Remove your waterproof dressings, skin tapes, and other bandages 3-5 days after surgery.   You may leave the incision(s) open to air.   You may replace a dressing/Band-Aid to cover the incision for comfort if you wish.   04/07/17 1253   04/07/17 0000  Discharge wound care:    Comments:  Shower every day Dry dressings over incisions as  needed   04/07/17 1253      Significant Diagnostic Studies:  Results for orders placed or performed during the hospital encounter of 04/01/17 (from the past 72 hour(s))  Hemoglobin and hematocrit, blood     Status: Abnormal   Collection Time: 04/04/17  1:44 PM  Result Value Ref Range   Hemoglobin 8.3 (L) 12.0 - 15.0 g/dL   HCT 24.3 (L) 36.0 - 46.0 %  Glucose, capillary     Status: Abnormal   Collection Time: 04/04/17  4:31 PM  Result Value Ref Range   Glucose-Capillary 116 (H) 65 - 99 mg/dL  Glucose, capillary     Status: Abnormal   Collection Time: 04/04/17  9:55 PM  Result Value Ref Range   Glucose-Capillary 146 (H) 65 - 99 mg/dL  CBC     Status: Abnormal   Collection Time: 04/05/17  3:17 AM  Result Value Ref Range   WBC 4.7 4.0 - 10.5 K/uL   RBC 2.64 (L) 3.87 - 5.11 MIL/uL   Hemoglobin 7.7 (L) 12.0 - 15.0 g/dL   HCT 22.7 (L) 36.0 - 46.0 %   MCV 86.0 78.0 - 100.0 fL   MCH 29.2 26.0 - 34.0 pg   MCHC 33.9 30.0 - 36.0 g/dL   RDW 16.7 (H) 11.5 - 15.5 %   Platelets 157 150 - 400 K/uL  Basic metabolic panel     Status: Abnormal   Collection Time: 04/05/17  3:17 AM  Result Value Ref Range   Sodium 139 135 - 145 mmol/L  Potassium 3.6 3.5 - 5.1 mmol/L   Chloride 108 101 - 111 mmol/L   CO2 27 22 - 32 mmol/L   Glucose, Bld 145 (H) 65 - 99 mg/dL   BUN 21 (H) 6 - 20 mg/dL   Creatinine, Ser 0.97 0.44 - 1.00 mg/dL   Calcium 8.1 (L) 8.9 - 10.3 mg/dL   GFR calc non Af Amer >60 >60 mL/min   GFR calc Af Amer >60 >60 mL/min    Comment: (NOTE) The eGFR has been calculated using the CKD EPI equation. This calculation has not been validated in all clinical situations. eGFR's persistently <60 mL/min signify possible Chronic Kidney Disease.    Anion gap 4 (L) 5 - 15  Glucose, capillary     Status: Abnormal   Collection Time: 04/05/17  7:47 AM  Result Value Ref Range   Glucose-Capillary 171 (H) 65 - 99 mg/dL  Glucose, capillary     Status: Abnormal   Collection Time: 04/05/17 11:52  AM  Result Value Ref Range   Glucose-Capillary 133 (H) 65 - 99 mg/dL  Glucose, capillary     Status: Abnormal   Collection Time: 04/05/17  4:11 PM  Result Value Ref Range   Glucose-Capillary 162 (H) 65 - 99 mg/dL  Glucose, capillary     Status: Abnormal   Collection Time: 04/05/17  9:33 PM  Result Value Ref Range   Glucose-Capillary 127 (H) 65 - 99 mg/dL  CBC     Status: Abnormal   Collection Time: 04/06/17  5:38 AM  Result Value Ref Range   WBC 2.6 (L) 4.0 - 10.5 K/uL   RBC 2.50 (L) 3.87 - 5.11 MIL/uL   Hemoglobin 7.4 (L) 12.0 - 15.0 g/dL   HCT 21.9 (L) 36.0 - 46.0 %   MCV 87.6 78.0 - 100.0 fL   MCH 29.6 26.0 - 34.0 pg   MCHC 33.8 30.0 - 36.0 g/dL   RDW 16.5 (H) 11.5 - 15.5 %   Platelets 168 150 - 400 K/uL  Basic metabolic panel     Status: Abnormal   Collection Time: 04/06/17  5:38 AM  Result Value Ref Range   Sodium 140 135 - 145 mmol/L   Potassium 3.4 (L) 3.5 - 5.1 mmol/L   Chloride 108 101 - 111 mmol/L   CO2 27 22 - 32 mmol/L   Glucose, Bld 129 (H) 65 - 99 mg/dL   BUN 16 6 - 20 mg/dL   Creatinine, Ser 0.79 0.44 - 1.00 mg/dL   Calcium 8.2 (L) 8.9 - 10.3 mg/dL   GFR calc non Af Amer >60 >60 mL/min   GFR calc Af Amer >60 >60 mL/min    Comment: (NOTE) The eGFR has been calculated using the CKD EPI equation. This calculation has not been validated in all clinical situations. eGFR's persistently <60 mL/min signify possible Chronic Kidney Disease.    Anion gap 5 5 - 15  Glucose, capillary     Status: Abnormal   Collection Time: 04/06/17  7:21 AM  Result Value Ref Range   Glucose-Capillary 129 (H) 65 - 99 mg/dL  Glucose, capillary     Status: Abnormal   Collection Time: 04/06/17 11:43 AM  Result Value Ref Range   Glucose-Capillary 127 (H) 65 - 99 mg/dL  Glucose, capillary     Status: Abnormal   Collection Time: 04/06/17  4:27 PM  Result Value Ref Range   Glucose-Capillary 103 (H) 65 - 99 mg/dL  Glucose, capillary     Status: Abnormal  Collection Time: 04/06/17   9:50 PM  Result Value Ref Range   Glucose-Capillary 144 (H) 65 - 99 mg/dL  CBC     Status: Abnormal   Collection Time: 04/07/17  4:10 AM  Result Value Ref Range   WBC 15.0 (H) 4.0 - 10.5 K/uL   RBC 2.59 (L) 3.87 - 5.11 MIL/uL   Hemoglobin 7.8 (L) 12.0 - 15.0 g/dL   HCT 23.5 (L) 36.0 - 46.0 %   MCV 90.7 78.0 - 100.0 fL   MCH 30.1 26.0 - 34.0 pg   MCHC 33.2 30.0 - 36.0 g/dL   RDW 16.5 (H) 11.5 - 15.5 %   Platelets 225 150 - 400 K/uL  Basic metabolic panel     Status: Abnormal   Collection Time: 04/07/17  4:10 AM  Result Value Ref Range   Sodium 143 135 - 145 mmol/L   Potassium 3.4 (L) 3.5 - 5.1 mmol/L   Chloride 107 101 - 111 mmol/L   CO2 28 22 - 32 mmol/L   Glucose, Bld 108 (H) 65 - 99 mg/dL   BUN 15 6 - 20 mg/dL   Creatinine, Ser 0.99 0.44 - 1.00 mg/dL   Calcium 8.5 (L) 8.9 - 10.3 mg/dL   GFR calc non Af Amer 60 (L) >60 mL/min   GFR calc Af Amer >60 >60 mL/min    Comment: (NOTE) The eGFR has been calculated using the CKD EPI equation. This calculation has not been validated in all clinical situations. eGFR's persistently <60 mL/min signify possible Chronic Kidney Disease.    Anion gap 8 5 - 15  Magnesium     Status: Abnormal   Collection Time: 04/07/17  4:10 AM  Result Value Ref Range   Magnesium 1.0 (L) 1.7 - 2.4 mg/dL  Glucose, capillary     Status: Abnormal   Collection Time: 04/07/17  7:44 AM  Result Value Ref Range   Glucose-Capillary 115 (H) 65 - 99 mg/dL  Creatinine, fluid (JP Drainage)     Status: None   Collection Time: 04/07/17  8:45 AM  Result Value Ref Range   Creat, Fluid 1.0 mg/dL    Comment: (NOTE) No normal range established for this test Results should be evaluated in conjunction with serum values Performed at [facility] [address], [place], [place] [address]    Fluid Type-FCRE JP DRAINAGE   Glucose, capillary     Status: Abnormal   Collection Time: 04/07/17 11:59 AM  Result Value Ref Range   Glucose-Capillary 133 (H) 65 - 99  mg/dL    Ir Fluoro Guide Cv Line Right  Result Date: 04/06/2017 INDICATION: Patient with history of recurrent endometrial cancer; status post resection, partial vaginectomy, low anterior resection, colostomy 11/19/14; status post colostomy takedown/loop ileostomy diversion 04/01/17; central venous access requested for fluid administration. EXAM: RIGHT UPPER EXTREMITY PICC LINE PLACEMENT WITH ULTRASOUND AND FLUOROSCOPIC GUIDANCE MEDICATIONS: 1% lidocaine to skin and subcutaneous tissue ANESTHESIA/SEDATION: None FLUOROSCOPY TIME:  Fluoroscopy Time:  42 seconds (6 mGy). COMPLICATIONS: None immediate. PROCEDURE: The right arm was prepped with chlorhexidine, draped in the usual sterile fashion using maximum barrier technique (cap and mask, sterile gown, sterile gloves, large sterile sheet, hand hygiene and cutaneous antisepsis) and infiltrated locally with 1% Lidocaine. Ultrasound demonstrated patency of the right brachial vein, and this was documented with an image. Under real-time ultrasound guidance, this vein was accessed with a 21 gauge micropuncture needle and image documentation was performed. A 0.018 wire was introduced in to the vein.  Over this, a 5 [place] double lumen power injectable PICC was advanced to the lower SVC/right atrial junction. Fluoroscopy during the procedure and fluoro spot radiograph confirms appropriate catheter position. The catheter was flushed and covered with a sterile dressing. Catheter length:  35 cm IMPRESSION: Successful right arm Power PICC line placement with ultrasound and fluoroscopic guidance. The catheter is ready for use. Read by: Rowe Robert, PA-C Electronically Signed   By: Corrie Mckusick D.O.   On: 04/06/2017 14:45   Ir US Guide Vasc Access Right  Result Date: 04/06/2017 INDICATION: Patient with history of recurrent endometrial cancer; status post resection, partial vaginectomy, low anterior resection, colostomy 11/19/14; status post colostomy takedown/loop ileostomy  diversion 04/01/17; central venous access requested for fluid administration. EXAM: RIGHT UPPER EXTREMITY PICC LINE PLACEMENT WITH ULTRASOUND AND FLUOROSCOPIC GUIDANCE MEDICATIONS: 1% lidocaine to skin and subcutaneous tissue ANESTHESIA/SEDATION: None FLUOROSCOPY TIME:  Fluoroscopy Time:  42 seconds (6 mGy). COMPLICATIONS: None immediate. PROCEDURE: The right arm was prepped with chlorhexidine, draped in the usual sterile fashion using maximum barrier technique (cap and mask, sterile gown, sterile gloves, large sterile sheet, hand hygiene and cutaneous antisepsis) and infiltrated locally with 1% Lidocaine. Ultrasound demonstrated patency of the right brachial vein, and this was documented with an image. Under real-time ultrasound guidance, this vein was accessed with a 21 gauge micropuncture needle and image documentation was performed. A 0.018 wire was introduced in to the vein. Over this, a 5 [place] double lumen power injectable PICC was advanced to the lower SVC/right atrial junction. Fluoroscopy during the procedure and fluoro spot radiograph confirms appropriate catheter position. The catheter was flushed and covered with a sterile dressing. Catheter length:  35 cm IMPRESSION: Successful right arm Power PICC line placement with ultrasound and fluoroscopic guidance. The catheter is ready for use. Read by: Rowe Robert, PA-C Electronically Signed   By: Corrie Mckusick D.O.   On: 04/06/2017 14:45    Discharge Exam: Blood pressure 127/61, pulse 86, temperature 98.8 F (37.1 C), temperature source Oral, resp. rate 16, height 5' 2.5" (1.588 m), weight 96.3 kg (212 lb 6.4 oz), SpO2 97 %.  General: Pt awake/alert/oriented x4 in No acute distress Eyes: PERRL, normal EOM.  Sclera clear.  No icterus Neuro: CN II-XII intact w/o focal sensory/motor deficits. Lymph: No head/neck/groin lymphadenopathy Psych:  No delerium/psychosis/paranoia HENT: Normocephalic, Mucus membranes moist.  No thrush Neck: Supple, No  tracheal deviation Chest: No chest wall pain w good excursion CV:  Pulses intact.  Regular rhythm MS: Normal AROM mjr joints.  No obvious deformity Abdomen: Soft.  Nondistended.  Nontender.  Proximal limb of loop ileostomy pink and good [place].  Gas and liquid stool in bag.  Distal loop inferior and flushed.  No leak.  Old blistering at old tape areas.  Healed.  Drain was serosanguineous fluid.  Removed by urology.  No evidence of peritonitis.  No incarcerated hernias. Ext:  SCDs BLE.  No mjr edema.  No cyanosis Skin: No petechiae / purpura  Past Medical History:  Diagnosis Date  . Acute bacterial bronchitis 06/04/2015  . Anemia in neoplastic disease   . Benign essential HTN   . Breast cancer of upper-inner quadrant of left female breast ([place])   . 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 Cancer of corpus uteri, except isthmus ([place]) dx 10-15-2004  oncologist-- dr Denman [name] and dr Alvy Bimler     dx endometrial and ovarian cancer s/p  chemotheapy and surgery:  recurrent 11-19-2014 w/ radiation 01-29-2015 to 03-10-2015  . Cellulitis of left abdominal wall near colostomy 12/03/2014  . Chronic idiopathic neutropenia (HCC)    presumed related to chemotherapy March 2006--- followed by dr Alvy Bimler   . Complication of anesthesia    PONV  . Diabetic retinopathy, background ([place])   . DM type 2 (diabetes mellitus, type 2) ([place])    monitored by dr Legrand Como altheimer  . Dysuria   . GERD (gastroesophageal reflux disease)    takes omeprazole  . Hiatal hernia   . History of bronchitis   . History of gastric polyp    2014  duodenum  . History of radiation therapy    01-29-2015 to 03-10-2015  pelvis 50.4Gy  . Hypothyroidism    monitored by dr Legrand Como altheimer  . Mixed dyslipidemia   . Multiple thyroid nodules    Managed by  Dr. Harlow Asa  . PONV (postoperative nausea and vomiting)    "scopolamine patch works for me"  . Radiation-induced dermatitis    contact dermatitis , radiation completed, rash only on ankles now.  . Right flank pain   . S/P colostomy ([place])   . Seasonal allergies   . Shoulder stiffness    bilateral   . Ureteral stricture, right UROLOGIT-  DR [facility]   CHRONIC--  TREATMENT URETERAL STENT  . Vitamin D deficiency   . Wears glasses     Past Surgical History:  Procedure Laterality Date  . APPENDECTOMY    . BREAST RECONSTRUCTION WITH PLACEMENT OF TISSUE EXPANDER AND FLEX HD (ACELLULAR HYDRATED DERMIS) Bilateral 12/11/2015   Procedure: BILATERAL BREAST RECONSTRUCTION WITH PLACEMENT OF TISSUE EXPANDERS;  Surgeon: Irene Limbo, MD;  Location: [facility];  Service: Plastics;  Laterality: Bilateral;  . COLONOSCOPY WITH PROPOFOL N/A 08/21/2013   Procedure: COLONOSCOPY WITH PROPOFOL;  Surgeon: Cleotis Nipper, MD;  Location: [facility];  Service: Endoscopy;  Laterality: N/A;  . COLOSTOMY TAKEDOWN N/A 12/04/2014   Procedure: LAPROSCOPIC LYSIS OF ADHESIONS, SPLENIC MOBILIZATION, RELOCATION OF COLOSTOMY, DEBRIDEMENT INITIAL COLOSTOMY SITE;  Surgeon: Michael [name], MD;  Location: [facility];  Service: General;  Laterality: N/A;  . CYSTOSCOPY W/ RETROGRADES Right 11/21/2015   Procedure: CYSTOSCOPY WITH RETROGRADE PYELOGRAM;  Surgeon: Alexis Frock, MD;  Location: [facility];  Service: Urology;  Laterality: Right;  . CYSTOSCOPY W/ URETERAL STENT PLACEMENT Right 11/21/2015   Procedure: CYSTOSCOPY WITH STENT REPLACEMENT;  Surgeon: Alexis Frock, MD;  Location: [facility];  Service: Urology;  Laterality: Right;  . CYSTOSCOPY W/ URETERAL STENT PLACEMENT Right 03/10/2016   Procedure: CYSTOSCOPY WITH STENT REPLACEMENT;  Surgeon: Alexis Frock, MD;  Location: [facility];  Service: Urology;  Laterality: Right;  . CYSTOSCOPY W/ URETERAL STENT PLACEMENT Right 06/30/2016   Procedure: CYSTOSCOPY WITH RETROGRADE  PYELOGRAM/URETERAL STENT EXCHANGE;  Surgeon: Alexis Frock, MD;  Location: [facility];  Service: Urology;  Laterality: Right;  . CYSTOSCOPY WITH RETROGRADE PYELOGRAM, URETEROSCOPY AND STENT PLACEMENT Right 03/20/2015   Procedure: CYSTOSCOPY WITH RETROGRADE PYELOGRAM, URETEROSCOPY WITH BALLOON DILATION AND STENT PLACEMENT ON RIGHT;  Surgeon: Alexis Frock, MD;  Location: [facility];  Service: Urology;  Laterality: Right;  . CYSTOSCOPY WITH RETROGRADE PYELOGRAM, URETEROSCOPY AND STENT PLACEMENT Right 05/02/2015   Procedure: CYSTOSCOPY WITH RIGHT RETROGRADE PYELOGRAM,  DIAGNOSTIC URETEROSCOPY AND STENT PULL ;  Surgeon: Alexis Frock, MD;  Location: [facility];  Service: Urology;  Laterality:  Right;  [name] Kitchen CYSTOSCOPY WITH RETROGRADE PYELOGRAM, URETEROSCOPY AND STENT PLACEMENT Right 09/05/2015   Procedure: CYSTOSCOPY WITH RETROGRADE PYELOGRAM,  AND STENT PLACEMENT;  Surgeon: Alexis Frock, MD;  Location: [facility];  Service: Urology;  Laterality: Right;  . CYSTOSCOPY WITH RETROGRADE PYELOGRAM, URETEROSCOPY AND STENT PLACEMENT Right 04/01/2017   Procedure: CYSTOSCOPY WITH RETROGRADE PYELOGRAM, URETEROSCOPY AND STENT PLACEMENT;  Surgeon: Alexis Frock, MD;  Location: [facility];  Service: Urology;  Laterality: Right;  . CYSTOSCOPY WITH STENT PLACEMENT Right 10/27/2016   Procedure: CYSTOSCOPY WITH STENT CHANGE and right retrograde pyelogram;  Surgeon: Alexis Frock, MD;  [facility];  Service: Urology;  Laterality: Right;  . EUS N/A 10/02/2014   Procedure: LOWER ENDOSCOPIC ULTRASOUND (EUS);  Surgeon: Arta Silence, MD;  Location: [facility];  Service: Endoscopy;  Laterality: N/A;  . EXCISION SOFT TISSUE MASS RIGHT FOREMAN  12-08-2006  . EYE SURGERY  as child   pytosis of eyelids repair  . INCISION AND DRAINAGE OF WOUND Bilateral 12/26/2015   Procedure: DEBRIDEMENT OF BILATERAL MASTECTOMY FLAPS;  Surgeon: Irene Limbo, MD;  Location: [place];  Service: Plastics;  Laterality: Bilateral;  . IR FLUORO GUIDE CV LINE RIGHT  04/06/2017  . IR US GUIDE VASC ACCESS RIGHT  04/06/2017  . LAPAROSCOPIC CHOLECYSTECTOMY  1990  . LIPOSUCTION WITH LIPOFILLING Bilateral 04/16/2016   Procedure: LIPOSUCTION WITH LIPOFILLING TO BILATERAL CHEST;  Surgeon: Irene Limbo, MD;  Location: [place];  Service: Plastics;  Laterality: Bilateral;  . MASTECTOMY Right 12/11/2015   PROPHYLACTIC   . MASTECTOMY COMPLETE / SIMPLE W/ SENTINEL NODE BIOPSY Left 12/11/2015   AXILLARY SENTINEL LYMPH NODE BIOPSY   . MASTECTOMY W/ SENTINEL NODE BIOPSY Bilateral 12/11/2015   Procedure: RIGHT PROPHYLACTIC MASTECTOMY, LEFT TOTAL MASTECTOMY WITH LEFT AXILLARY SENTINEL LYMPH NODE BIOPSY;  Surgeon: Stark Klein, MD;  Location: [place];  Service: General;  Laterality: Bilateral;  . OSTOMY N/A 11/19/2014   Procedure: OSTOMY;  Surgeon: Michael [name], MD;  Location[facility];  Service: General;  Laterality: N/A;  . PROCTOSCOPY N/A 04/01/2017   Procedure: RIDGE PROCTOSCOPY;  Surgeon: Michael [name], MD;  Location[facility];  Service: General;  Laterality: N/A;  . REMOVAL OF BILATERAL TISSUE EXPANDERS WITH PLACEMENT OF BILATERAL BREAST IMPLANTS Bilateral 04/16/2016   Procedure: REMOVAL OF BILATERAL TISSUE EXPANDERS WITH PLACEMENT OF BILATERAL BREAST IMPLANTS;  Surgeon: Irene Limbo, MD;  Location: [place];  Service: Plastics;  Laterality: Bilateral;  . ROBOTIC ASSISTED LAP VAGINAL HYSTERECTOMY N/A 11/19/2014   Procedure: ROBOTIC LYSIS OF ADHESIONS, CONVERTED TO LAPAROTOMY RADICAL UPPER VAGINECTOMY,LOW ANTERIOR BOWEL RESECTION, COLOSTOMY, BILATERAL URETERAL STENT PLACEMENT AND CYSTONOMY CLOSURE;  Surgeon: Everitt Amber, MD;  Location: [facility];  Service: Gynecology;  Laterality: N/A;  . TISSUE EXPANDER FILLING Bilateral 12/26/2015   Procedure: EXPANSION OF BILATERAL CHEST TISSUE EXPANDERS (60 mL- Right; 75 mL- Left);  Surgeon: Irene Limbo, MD;   Location: [place];  Service: Plastics;  Laterality: Bilateral;  . TONSILLECTOMY    . TOTAL ABDOMINAL HYSTERECTOMY  March 2006   [facility]   and Bilateral Salpingoophorectomy/  staging for Ovarian cancer/  an  . XI ROBOTIC ASSISTED LOWER ANTERIOR RESECTION N/A 04/01/2017   Procedure: XI ROBOTIC VS LAPAROSCOPIC COLOSTOMY TAKEDOWN WITH LYSIS OF ADHESIONS.;  Surgeon: Michael [name], MD;  Location[facility];  Service: General;  Laterality: N/A;  ERAS PATHWAY    Social History   Social History  . Marital status: Married    Spouse name: N/A  .  Number of children: 1  . Years of education: N/A   Occupational History  . retired Therapist, sports from [place] History Main Topics  . Smoking status: Never Smoker  . Smokeless tobacco: Never Used  . Alcohol use Yes     Comment: rare social  . Drug use: Unknown  . Sexual activity: Not Currently   Other Topics Concern  . Not on file   Social History Narrative   Exercise-- has not gotten back into it since cancer came back    Family History  Problem Relation Age of Onset  . Cancer Mother 69       stomach ca  . Hypertension Mother   . Cancer Father 49       prostate ca  . Diabetes Father   . Heart disease Father        CABG  . Breast cancer Maternal Aunt        dx in her 53s  . Lymphoma Paternal Aunt   . Brain cancer Paternal Grandfather   . Ovarian cancer Other   . Diabetes Sister   . Hypertension Brother y-10  . Heart disease Brother        CABG  . Diabetes Brother     Current Facility-Administered Medications  Medication Dose Route Frequency Provider Last Rate Last Dose  . 0.9 %  sodium chloride infusion  250 mL Intravenous PRN Michael [name], MD      . acetaminophen (TYLENOL) tablet 1,000 mg  1,000 mg Oral TID Michael [name], MD   1,000 mg at 04/07/17 1053  . alum & mag hydroxide-simeth (MAALOX/MYLANTA) 200-200-20 MG/5ML suspension 30 mL  30 mL Oral Q6H PRN Michael [name], MD      . anastrozole (ARIMIDEX) tablet 1 mg  1  mg Oral Daily Michael [name], MD   1 mg at 04/07/17 1052  . artificial tears (LACRILUBE) ophthalmic ointment   Both Eyes QHS Berton Mount, RPH      . aspirin EC tablet 81 mg  81 mg Oral Daily Michael [name], MD   81 mg at 04/07/17 1053  . bismuth subsalicylate (PEPTO BISMOL) 262 MG/15ML suspension 30 mL  30 mL Oral Q8H PRN Michael [name], MD      . calcium-vitamin D (OSCAL WITH D) 500-200 MG-UNIT per tablet 1 tablet  1 tablet Oral Daily Berton Mount, RPH   1 tablet at 04/07/17 1053   And  . magnesium gluconate (MAGONATE) tablet 250 mg  250 mg Oral Daily Berton Mount, RPH   250 mg at 04/07/17 1052  . chlorhexidine gluconate (MEDLINE KIT) (PERIDEX) 0.12 % solution 15 mL  15 mL Mouth Rinse BID Michael [name], MD   15 mL at 04/06/17 2003  . Chlorhexidine Gluconate Cloth 2 % PADS 6 each  6 each Topical [id number] Michael [name], MD   6 each at 04/07/17 [phone]  . cholecalciferol (VITAMIN D) tablet 10,000 Units  10,000 Units Oral Weekly Michael [name], MD      . diphenhydrAMINE (BENADRYL) 12.5 MG/5ML elixir 12.5 mg  12.5 mg Oral Q6H PRN Michael [name], MD       Or  . diphenhydrAMINE (BENADRYL) injection 12.5 mg  12.5 mg Intravenous Q6H PRN Michael [name], MD      . diphenhydrAMINE (BENADRYL) capsule 25 mg  25 mg Oral QHS PRN Michael [name], MD      . enoxaparin (LOVENOX) injection 40 mg  40 mg Subcutaneous Ardeen Fillers, MD   40 mg at  04/05/17 2152  . fentaNYL (SUBLIMAZE) injection 50 mcg  50 mcg Intravenous Q2H PRN Minda Ditto, RPH   50 mcg at 04/04/17 1945  . ferrous sulfate tablet 325 mg  325 mg Oral BID WC Michael [name], MD   325 mg at 04/07/17 0752  . gabapentin (NEURONTIN) capsule 300 mg  300 mg Oral BID Michael [name], MD   300 mg at 04/07/17 1053  . guaiFENesin-dextromethorphan (ROBITUSSIN DM) 100-10 MG/5ML syrup 10 mL  10 mL Oral Q4H PRN Michael [name], MD      . hydrALAZINE (APRESOLINE) injection 5-20 mg  5-20 mg Intravenous Q6H PRN Michael [name], MD      . hydrocortisone (ANUSOL-HC) 2.5 %  rectal cream 1 application  1 application Topical QID PRN Michael [name], MD      . hydrocortisone cream 1 % 1 application  1 application Topical TID PRN Michael [name], MD      . HYDROmorphone (DILAUDID) injection 0.5-2 mg  0.5-2 mg Intravenous Q1H PRN Michael [name], MD   1 mg at 04/07/17 0946  . HYDROmorphone (DILAUDID) tablet 2-4 mg  2-4 mg Oral Q4H PRN Michael [name], MD   2 mg at 04/07/17 1228  . insulin aspart (novoLOG) injection 0-15 Units  0-15 Units Subcutaneous TID WC Michael [name], MD   2 Units at 04/07/17 1227  . insulin aspart (novoLOG) injection 0-5 Units  0-5 Units Subcutaneous QHS Michael [name], MD      . lactated ringers bolus 1,000 mL  1,000 mL Intravenous QPM Michael [name], MD   Stopped at 04/06/17 2032  . lactated ringers infusion 1,000 mL  1,000 mL Intravenous Q8H PRN Michael [name], MD      . levothyroxine (SYNTHROID, LEVOTHROID) tablet 150 mcg  150 mcg Oral QAC breakfast Michael [name], MD   150 mcg at 04/07/17 0751  . lidocaine (XYLOCAINE) 1 % (with pres) injection    PRN Allred, Darrell K, PA-C   10 mL at 04/06/17 1424  . linagliptin (TRADJENTA) tablet 5 mg  5 mg Oral Daily Michael [name], MD   5 mg at 04/07/17 1053  . lip balm (CARMEX) ointment 1 application  1 application Topical BID Michael [name], MD   1 application at 16/10/96 1055  . loperamide (IMODIUM) capsule 2 mg  2 mg Oral q morning - 10a Michael [name], MD   2 mg at 04/07/17 1053  . loperamide (IMODIUM) capsule 2-4 mg  2-4 mg Oral Q8H PRN Michael [name], MD      . loperamide (IMODIUM) capsule 4 mg  4 mg Oral Ardeen Fillers, MD   4 mg at 04/06/17 2213  . loratadine (CLARITIN) tablet 10 mg  10 mg Oral Daily Michael [name], MD   10 mg at 04/07/17 1053  . magic mouthwash  15 mL Oral QID PRN Michael [name], MD   15 mL at 04/04/17 2101  . MEDLINE mouth rinse  15 mL Mouth Rinse QID Michael [name], MD   15 mL at 04/06/17 1510  . menthol-cetylpyridinium (CEPACOL) lozenge 3 mg  1 lozenge Oral PRN Michael [name], MD      .  metFORMIN (GLUCOPHAGE) tablet 1,000 mg  1,000 mg Oral BID WC Michael [name], MD   1,000 mg at 04/07/17 0751  . methocarbamol (ROBAXIN) tablet 1,000 mg  1,000 mg Oral QID Michael [name], MD   1,000 mg at 04/07/17 1053  . metoprolol tartrate (LOPRESSOR) injection 5 mg  5 mg Intravenous Q6H PRN Michael [name], MD      .  ondansetron (ZOFRAN) tablet 4 mg  4 mg Oral Q6H PRN Michael [name], MD       Or  . ondansetron [facility]) injection 4 mg  4 mg Intravenous Q6H PRN Michael [name], MD      . phenol (CHLORASEPTIC) mouth spray 1-2 spray  1-2 spray Mouth/Throat PRN Michael [name], MD      . potassium chloride SA (K-DUR,KLOR-CON) CR tablet 40 mEq  40 mEq Oral Daily Michael [name], MD   40 mEq at 04/07/17 1052  . prenatal multivitamin tablet 1 tablet  1 tablet Oral Daily Michael [name], MD   1 tablet at 04/07/17 1052  . prochlorperazine (COMPAZINE) injection 5-10 mg  5-10 mg Intravenous Q4H PRN Michael [name], MD      . protein supplement (PREMIER PROTEIN) liquid  11 oz Oral BID BM Michael [name], MD   11 oz at 04/05/17 1525  . psyllium (HYDROCIL/METAMUCIL) packet 1 packet  1 packet Oral Daily Michael [name], MD   1 packet at 04/07/17 1052  . rosuvastatin (CRESTOR) tablet 10 mg  10 mg Oral QPM Michael [name], MD   10 mg at 04/06/17 1832  . sodium chloride flush (NS) 0.9 % injection 10-40 mL  10-40 mL Intracatheter PRN Michael [name], MD   10 mL at 04/06/17 0541  . sodium chloride flush (NS) 0.9 % injection 3 mL  3 mL Intravenous Gorden Harms, MD   3 mL at 04/06/17 2215  . sodium chloride flush (NS) 0.9 % injection 3 mL  3 mL Intravenous PRN Michael [name], MD   3 mL at 04/04/17 2101  . Tbo-Filgrastim (GRANIX) injection 480 mcg  480 mcg Subcutaneous Once every 6 days Berton Mount, RPH   480 mcg at 04/06/17 1833  . witch hazel-glycerin (TUCKS) pad 1 application  1 application Topical PRN Michael [name], MD         Allergies  Allergen Reactions  . Penicillins Swelling    Facial swelling Has patient had a PCN  reaction causing immediate rash, facial/tongue/throat swelling, SOB or lightheadedness with hypotension: Yes Has patient had a PCN reaction causing severe rash involving mucus membranes or skin necrosis: Yes Has patient had a PCN reaction that required hospitalization No Has patient had a PCN reaction occurring within the last 10 years: No If all of the above answers are "NO", then may proceed with Cephalosporin use.   [name] Kitchen Ultram [Tramadol] Hives  . Adhesive [Tape]     blisters  . Cefaclor Rash    Ceclor  . Erythromycin     Gastritis, abd cramps  . Trimethoprim Rash  . Ciprofloxacin Other (See Comments)    Unknown On Dr notes   . Oxycodone     " I just feel weird"  . Pectin Rash    Pectin ring for stoma  . Sulfa Antibiotics Rash    Signed: Morton Peters, M.D., F.A.C.S. Gastrointestinal and Minimally Invasive Surgery [facility] [address], [place], [place] [address] [phone] Main / Paging   04/07/2017, 12:53 PM

## 2017-04-07 NOTE — Progress Notes (Signed)
PT Cancellation Note  Patient Details Name: [name] MRN: [id number] DOB: 15-Nov-1955   Cancelled Treatment:    Reason Eval/Treat Not Completed: PT screened, no needs identified, will sign off. Spoke with pt and husband briefly. Pt politely declines participation with PT. She is set to d/c home later this evening. She already has a RW. She has been up ambulating around the unit with her husband. Pt and husband deny need for f/u PT at home at this time. Will sign off at pt's request.    Weston Anna, MPT Pager: [phone]

## 2017-04-07 NOTE — Consult Note (Signed)
[place] Nurse ostomy follow up Stoma type/location: RLQ loop ileostomy Stomal assessment/size: 1 and 3/8 inch with proximal limb somewhat long and dusky.  Expect this to shorten considerable and will hopefully remain above skin level. Base of stoma is pink. Peristomal assessment: intact Treatment options for stomal/peristomal skin: skin barrier ring Output: green/brown effluent Ostomy pouching: 2pc. 2 and 1/4 inch pouching system with skin barrier ring Education provided: Pouch changed today. Patient and husband taught again how the ileostomy differs from a colostomy; effluent is thinner, patient could become dehydrated faster. Will need to drink as often as she empties her pouch and to consume Gatorade and other electrolyte replacements. Taught to always empty prior to bedtime. Husband is primary Pharmacist, hospital; they both empty pouch. Supplies provided for discharge. Enrolled patient in [place] [facility] Discharge program: Yes.  [facility] notified of new ostomy (ileostomy) and will send 4 pouches, 4 skin barrier s and 4 skin barrier rings.  [place] nursing team will follow, and will remain available to this patient, the nursing, surgical and medical teams.  Patient to be discharged today with oversight from Dr. Johney Maine and support from [facility]. Thanks, Maudie Flakes, MSN, RN, [place], Arther Abbott  Pager# [phone]

## 2017-04-07 NOTE — Discharge Instructions (Signed)
Indwelling Urinary Catheter Care, Adult Taking good care of your catheter will keep it working properly and help prevent problems from developing. How to wear your catheter Attach your catheter to your leg with adhesive tape or a leg strap. Make sure there is no tension on the catheter. If you use adhesive tape, first remove any sticky residue from the previous tape you used. How to wear a drainage bag  You should have received a large overnight drainage bag and a smaller leg bag that fits underneath clothing. You may wear the overnight bag at any time, but you should never wear the smaller leg bag at night.  Always keep the overnight drainage bag below the level of your bladder, but keep it off the floor. When you sleep, hang the bag inside a wastebasket that is covered by a clean plastic bag.  Always wear the leg bag below your knee. Keep the leg bag secure with a leg strap or adhesive tape. How to care for your skin  Clean the skin around the catheter at least once every day.  Shower every day. Do not take baths.  Apply creams, lotions, or ointments to your genital area only as told by your health care provider.  Do not use powders, sprays, or lotions on your genital area. How to clean your catheter and your skin 1. Wash your hands with soap and water. 2. Wet a washcloth in warm water and mild soap. 3. Use the washcloth to clean the skin where the catheter enters your body. Clean downward, wiping away from the catheter in small circles. Do not wipe toward the catheter. This can push bacteria into the urethra and cause infection. 4. Pat the area dry with a clean towel. Make sure to remove all soap. How to care for your drainage bags Empty your drainage bag when it is ?- full, or at least 2-3 times a day. Replace your drainage bag once a month or sooner if it starts to smell bad or look dirty. Do not clean your drainage bag unless told by your health care provider. Emptying a drainage  bag  Supplies Needed  Rubbing alcohol.  Gauze pad or cotton ball.  Adhesive tape or a leg strap.  Steps 1. Wash your hands with soap and water. 2. Detach the drainage bag from your leg. 3. Hold the drainage bag over the toilet or a clean container. Keep the drainage bag below your hips and bladder. This stops urine from going back into the tubing and into your bladder. 4. Open the pour spout at the bottom of the bag. 5. Empty the urine into the toilet or container. Do not let the pour spout touch any surface. This helps keep bacteria out of the bag and helps prevent infection. 6. Apply rubbing alcohol to a gauze pad or cotton ball. 7. Use the gauze pad or cotton ball to clean the pour spout. 8. Close the pour spout. 9. Attach the bag to your leg with adhesive tape or a leg strap. 10. Wash your hands.  Changing a drainage bag Supplies Needed  Alcohol wipes.  A clean drainage bag.  Adhesive tape or a leg strap.  Steps 1. Wash your hands with soap and water. 2. Detach the dirty drainage bag from your leg. 3. Pinch the rubber catheter with your fingers so that urine does not spill out. 4. Disconnect the catheter tube from the drainage tube at the connection valve. Do not let the tubes touch any surface. 5.  Clean the end of the catheter tube with an alcohol wipe. Use a different alcohol wipe to clean the end of the drainage tube. 6. Connect the catheter tube to the drainage tube of the clean drainage bag. 7. Attach the new bag to your leg with adhesive tape or a leg strap. Avoid attaching the new bag too tightly. 8. Wash your hands.  How to prevent infection and other problems  Never pull on your catheter or try to remove it. Pulling can damage your internal tissues.  Always wash your hands before and after handling your catheter.  If a leg strap gets wet, replace it with a dry one.  Drink enough fluids to keep your urine clear or pale yellow, or as told by your health  care provider.  Do not let the drainage bag or tubing touch the floor.  Wear cotton underwear. Cotton absorbs moisture and keeps your skin dry.  If you are female, wipe from front to back after each bowel movement.  Check the catheter often to make sure that it works properly and the tubing is not twisted or curled. Contact a health care provider if:  Your urine is cloudy.  Your urine smells unusually bad.  Your urine is not draining into the bag.  Your catheter gets clogged.  Your catheter starts to leak.  Your bladder feels full. Get help right away if:  You have redness, swelling, or pain where the catheter enters your body.  You have fluid, pus, or a bad smell coming from the area where the catheter enters your body.  The area where the catheter enters your body feels warm to the touch.  You have a fever.  You have pain in your abdomen, legs, lower back, or bladder.  You see blood fill the catheter.  Your urine is pink or red.  You have nausea, vomiting, or chills.  Your catheter gets pulled out. This information is not intended to replace advice given to you by your health care provider. Make sure you discuss any questions you have with your health care provider. Document Released: 07/19/2005 Document Revised: 06/16/2016 Document Reviewed: 01/01/2014 Elsevier Interactive Patient Education  2018 [place] heard that people get along just fine with only one of their eyes, or one of their lungs, or one of their kidneys. But you also know that you have only one intestine and only one bladder, and that leaves you feeling awfully empty, both physically and emotionally: You think no other people go around without part of their intestine with the ends of their intestines sticking out through their abdominal walls.   YOU ARE NOT ALONE.  There are nearly three quarters of a million people in the [place] who have an ostomy; people who  have had surgery to remove all or part of their colons or bladders.   There is even a national association, the [place] [employer] [place] with over 350 local affiliated support groups that are organized by volunteers who provide peer support and counseling. [name] has a toll free telephone num-ber, [phone] and an educational, interactive website, www.ostomy.org   An ostomy is an opening in the belly (abdominal wall) made by surgery. Ostomates are people who have had this procedure. The opening (stoma) allows the kidney or bowel to discharge waste. An external pouch covers the stoma to collect waste. Pouches are are a simple bag and are odor free. Different companies have disposable or reusable pouches to  fit one's lifestyle. An ostomy can either be temporary or permanent.   THERE ARE THREE MAIN TYPES OF OSTOMIES  Colostomy. A colostomy is a surgically created opening in the large intestine (colon).  Ileostomy. An ileostomy is a surgically created opening in the small intestine.  Urostomy. A urostomy is a surgically created opening to divert urine away from the bladder.  OSTOMY Care  The following guidelines will make care of your colostomy easier. Keep this information close by for quick reference.  Helpful DIET hints Eat a well-balanced diet including vegetables and fresh fruits. Eat on a regular schedule. Drink at least 6 to 8 glasses of fluids daily. Eat slowly in a relaxed atmosphere. Chew your food thoroughly. Avoid chewing gum, smoking, and drinking from a straw. This will help decrease the amount of air you swallow, which may help reduce gas. Eating yogurt or drinking buttermilk may help reduce gas.  To control gas at night, do not eat after 8 p.m. This will give your bowel time to quiet down before you go to bed.  If gas is a problem, you can purchase Beano. Sprinkle Beano on the first bite of food before eating to reduce gas. It has no flavor and should not change the  taste of your food. You can buy Beano over the counter at your local drugstore.  Foods like fish, onions, garlic, broccoli, asparagus, and cabbage produce odor. Although your pouch is odor-proof, if you eat these foods you may notice a stronger odor when emptying your pouch. If this is a concern, you may want to limit these foods in your diet.  If you have an ileostomy, you will have chronic diarrhea & need to drink more liquids to avoid getting dehydrated.  Consider antidiarrheal medicine like imodium (loperamide) or Lomotil to help slow down bowel movements / diarrhea into your ileostomy bag.  GETTING TO GOOD BOWEL HEALTH WITH AN ILEOSTOMY . Irregular bowel habits such as constipation and diarrhea can lead to many problems over time.  The goal: 3-6 small BOWEL MOVEMENTS A DAY!  To have soft, regular bowel movements:   Drink plenty of fluids, consider 4-6 tall glasses of water a day.    Controlling diarrheA  o Switch to liquids and simpler foods for a few days to avoid stressing your intestines further. o Avoid dairy products (especially milk & ice cream) for a short time.  The intestines often can lose the ability to digest lactose when stressed. o Avoid foods that cause gassiness or bloating.  Typical foods include beans and other legumes, cabbage, broccoli, and dairy foods.  Every person has some sensitivity to other foods, so listen to our body and avoid those foods that trigger problems for you. o Adding fiber (Citrucel, Metamucil, psyllium, Miralax) gradually can help thicken stools by absorbing excess fluid and retrain the intestines to act more normally.  Slowly increase the dose over a few weeks.  Too much fiber too soon can backfire and cause cramping & bloating. o Probiotics (such as active yogurt, Align, etc) may help repopulate the intestines and colon with normal bacteria and calm down a sensitive digestive tract.  Most studies show it to be of mild help, though, and such products  can be costly. o Medicines: - Bismuth subsalicylate (ex. Kayopectate, Pepto Bismol) every 30 minutes for up to 6 doses can help control diarrhea.  Avoid if pregnant. - Loperamide (Immodium) can slow down diarrhea.  Start with two tablets (41m total) first and then try one  tablet every 6 hours.  Avoid if you are having fevers or severe pain.  If you are not better or start feeling worse, stop all medicines and call your doctor for advice o Call your doctor if you are getting worse or not better.  Sometimes further testing (cultures, endoscopy, X-ray studies, bloodwork, etc) may be needed to help diagnose and treat the cause of the diarrhea.  TROUBLESHOOTING IRREGULAR BOWELS 1) Avoid extremes of bowel movements (no bad constipation/diarrhea) 2) Miralax 17gm mixed in 8oz. water or juice-daily. May use twice a day as needed  3) Gas-x,Phazyme, etc. as needed for gas & bloating.  4) Soft,bland diet. No spicy,greasy,fried foods.  5) Prilosec (omeprazole) over-the-counter as needed  6) May hold gluten/wheat products from diet to see if symptoms improve.  7)  May try probiotics (Align, Activa, etc) to help calm the bowels down 7) If symptoms become worse call back immediately.   Applying the pouching system To apply your pouch, follow these steps:  Place all your equipment close at hand before removing your pouch.  Wash your hands.  Stand or sit in front of a mirror. Use the position that works best for you. Remember that you must keep the skin around the stoma wrinkle-free for a good seal.  Gently remove the used pouch (1-piece system) or the pouch and old wafer (2-piece system). Empty the pouch into the toilet. Save the closure clip to use again.  Wash the stoma itself and the skin around the stoma. Your stoma may bleed a little when being washed. This is normal. Rinse and pat dry. You may use a wash cloth or soft paper towels (like Bounty), mild soap (like Dial, Safeguard, or [place]), and water.  Avoid soaps that contain perfumes or lotions.  For a new pouch (1-piece system) or a new wafer (2-piece system), measure your stoma using the stoma guide in each box of supplies.  Trace the shape of your stoma onto the back of the new pouch or the back of the new wafer. Cut out the opening. Remove the paper backing and set it aside.  Optional: Apply a skin barrier powder to surrounding skin if it is irritated (bare or weeping), and dust off the excess. Optional: Apply a skin-prep wipe (such as Skin Prep or All-Kare) to the skin around the stoma, and let it dry. Do not apply this solution if the skin is irritated (red, tender, or broken) or if you have shaved around the stoma. Optional: Apply a skin barrier paste (such as Stomahesive, Coloplast, or Premium) around the opening cut in the back of the pouch or wafer. Allow it to dry for 30 to 60 seconds.  Hold the pouch (1-piece system) or wafer (2-piece system) with the sticky side toward your body. Make sure the skin around the stoma is wrinkle-free. Center the opening on the stoma, then press firmly to your abdomen (Fig. 4). Look in the mirror to check if you are placing the pouch, or wafer, in the right position. For a 2-piece system, snap the pouch onto the wafer. Make sure it snaps into place securely.  Place your hand over the stoma and the pouch or wafer for about 30 seconds. The heat from your hand can help the pouch or wafer stick to your skin.  Add deodorant (such as Super Banish or Nullo) to your pouch. Other options include food extracts such as vanilla oil and peppermint extract. Add about 10 drops of the deodorant to the pouch. Then  apply the closure clamp. Note: Do not use toxic  chemicals or commercial cleaning agents in your pouch. These substances may harm the stoma.  Optional: For extra seal, apply tape to all 4 sides around the pouch or wafer, as if you were framing a picture. You may use any brand of medical adhesive tape. Change  your pouch every 5 to 7 days. Change it immediately if a leak occurs.  Wash your hands afterwards.  If you are wearing a 2-piece system, you may use 2 new pouches per week and alternate them. Rinse the pouch with mild soap and warm water and hang it to dry for the next day. Apply the fresh pouch. Alternate the 2 pouches like this for a week. After a week, change the wafer and begin with 2 new pouches. Place the old pouches in a plastic bag, and put them in the trash.    Tips for colostomy care  Applying Your Pouch You may stand or sit to apply your pouch.  Keep the skin where you apply the pouch wrinkle-free. If the skin around the pouch is wrinkled, the seal may break when your skin stretches.  If hair grows close to your stoma, you may trim off the hair with scissors, an electric razor, or a safety razor.  Always have a mirror nearby so you can get a better view of your stoma.  When you apply a new pouch, write the date on the adhesive tape. This will remind you of when you last changed your pouch.  Changing Your Pouch The best time to change your pouch is in the morning, before eating or drinking anything. Your stoma can function at any time, but it will function more after eating or drinking.  Emptying Your Pouch Empty your pouch when it is one-third full (of urine, stool, and/or gas). If you wait until your pouch is fuller than this, it will be more difficult to empty and more noticeable. When you empty your pouch, either put toilet paper in the toilet bowl first, or flush the toilet while you empty the pouch. This will reduce splashing. You can empty the pouch between your legs or to one side while sitting, or while standing or stooping. If you have a 2-piece system, you can snap off the pouch to empty it. Remember that your stoma may function during this time. If you wish to rinse your pouch after you empty it, a [place] baster can be helpful. When using a baster, squirt water up into  the pouch through the opening at the bottom. With a 2-piece system, you can snap off the pouch to rinse it. After rinsing  your pouch, empty it into the toilet. When rinsing your pouch at home, put a few granules of Dreft soap in the rinse water. This helps lubricate and freshen your pouch. The inside of your pouch can be sprayed with non-stick cooking oil (Pam spray). This may help reduce stool sticking to the inside of the pouch.  Bathing You may shower or bathe with your pouch on or off. Remember that your stoma may function during this time.  The materials you use to wash your stoma and the skin around it should be clean, but they do not need to be sterile.  Wearing Your Pouch During hot weather, or if you perspire a lot in general, wear a cover over your pouch. This may prevent a rash on your skin under the pouch. Pouch covers are sold at ostomy supply stores.  Wear the pouch inside your underwear for better support. Watch your weight. Any gain or loss of 10 to 15 pounds or more can change the way your pouch fits.  Going Away From Home A collapsible cup (like those that come in travel kits) or a soft plastic squirt bottle with a pull-up top (like a travel bottle for shampoo) can be used for rinsing your pouch when you are away from home. Tilt the opening of the pouch at an upward angle when using a cup to rinse.  Carry wet wipes or extra tissues to use in public bathrooms.  Carry an extra pouching system with you at all times.  Never keep ostomy supplies in the glove compartment of your car. Extreme heat or cold can damage the skin barriers and adhesive wafers on the pouch.  When you travel, carry your ostomy supplies with you at all times. Keep them within easy reach. Do not pack ostomy supplies in baggage that will be checked or otherwise separated from you, because your baggage might be lost. If youre traveling out of the country, it is helpful to have a letter stating that you are  carrying ostomy supplies as a medical necessity.  If you need ostomy supplies while traveling, look in the yellow pages of the telephone book under Surgical Supplies. Or call the local ostomy organization to find out where supplies are available.  Do not let your ostomy supplies get low. Always order new pouches before you use the last one.  Reducing Odor Limit foods such as broccoli, cabbage, onions, fish, and garlic in your diet to help reduce odor. Each time you empty your pouch, carefully clean the opening of the pouch, both inside and outside, with toilet paper. Rinse your pouch 1 or 2 times daily after you empty it (see directions for emptying your pouch and going away from home). Add deodorant (such as Super Banish or Nullo) to your pouch. Use air deodorizers in your bathroom. Do not add aspirin to your pouch. Even though aspirin can help prevent odor, it could cause ulcers on your stoma.  When to call the doctor Call the doctor if you have any of the following symptoms: Purple, black, or white stoma Severe cramps lasting more than 6 hours Severe watery discharge from the stoma lasting more than 6 hours No output from the colostomy for 3 days Excessive bleeding from your stoma Swelling of your stoma to more than 1/2-inch larger than usual Pulling inward of your stoma below skin level Severe skin irritation or deep ulcers Bulging or other changes in your abdomen  When to call your ostomy nurse Call your ostomy/enterostomal therapy (ET) nurse if any of the following occurs: Frequent leaking of your pouching system Change in size or appearance of your stoma, causing discomfort or problems with your pouch Skin rash or rawness Weight gain or loss that causes problems with your pouch      FREQUENTLY ASKED QUESTIONS   Why havent you met any of these folks who have an ostomy?  Well, maybe you have! You just did not recognize them because an ostomy doesn't show. It can be  kept secret if you wish. Why, maybe some of your best friends, office associates or neighbors have an ostomy ... you never can tell.   People facing ostomy surgery have many quality-of-life questions like:  Will you bulge? Smell? Make noises? Will you feel waste leaving your body? Will you be a captive of the toilet? Will you starve?  Be a social outcast? Get/stay married? Have babies? Easily bathe, go swimming, bend over?  OK, lets look at what you can expect:   Will you bulge?  Remember, without part of the intestine or bladder, and its contents, you should have a flatter tummy than before. You can expect to wear, with little exception, what you wore before surgery ... and this in-cludes tight clothing and bathing suits.   Will you smell?  Today, thanks to modern odor proof pouching systems, you can walk into an ostomy support group meeting and not smell anything that is foul or offensive. And, for those with an ileostomy or colostomy who are concerned about odor when emptying their pouch, there are in-pouch deodorants that can be used to eliminate any waste odors that may exist.   Will you make noises?  Everyone produces gas, especially if they are an air-swallower. But intestinal sounds that occur from time to time are no differ-ent than a gurgling tummy, and quite often your clothing will muffle any sounds.   Will you feel the waste discharges?  For those with a colostomy or ileostomy there might be a slight pressure when waste leaves your body, but understand that the intestines have no nerve endings, so there will be no unpleasant sensations. Those with a urostomy will probably be unaware of any kidney drainage.   Will you be a captive of the toilet?  Immediately post-op you will spend more time in the bathroom than you will after your body recovers from surgery. Every person is different, but on average those with an ileostomy or urostomy may empty their pouches 4 to 6 times a day; a  little  less if you have a colostomy. The average wear time between pouch system changes is 3 to 5 days and the changing process should take less than 30 minutes.   Will I need to be on a special diet? Most people return to their normal diet when they have recovered from surgery. Be sure to chew your food well, eat a well-balanced diet and drink plenty of fluids. If you experience problems with a certain food, wait a couple of weeks and try it again.  Will there be odor and noises? Pouching systems are designed to be odor-proof or odor-resistant. There are deodorants that can be used in the pouch. Medications are also available to help reduce odor. Limit gas-producing foods and carbonated beverages. You will experience less gas and fewer noises as you heal from surgery.  How much time will it take to care for my ostomy? At first, you may spend a lot of time learning about your ostomy and how to take care of it. As you become more comfortable and skilled at changing the pouching system, it will take very little time to care for it.   Will I be able to return to work? People with ostomies can perform most jobs. As soon as you have healed from surgery, you should be able to return to work. Heavy lifting (more than 10 pounds) may be discouraged.   What about intimacy? Sexual relationships and intimacy are important and fulfilling aspects of your life. They should continue after ostomy surgery. Intimacy-related concerns should be discussed openly between you and your partner.   Can I wear regular clothing? You do not need to wear special clothing. Ostomy pouches are fairly flat and barely noticeable. Elastic undergarments will not hurt the stoma or prevent the ostomy from functioning.   Can I participate in sports?  An ostomy should not limit your involvement in sports. Many people with ostomies are runners, skiers, swimmers or participate in other active lifestyles. Talk with your caregiver first  before doing heavy physical activity.  Will you starve?  Not if you follow doctors orders at each stage of your post-op adjustment. There is no such thing as an ostomy diet. Some people with an ostomy will be able to eat and tolerate anything; others may find diffi-culty with some foods. Each person is an individual and must determine, by trial, what is best for them. A good practice for all is to drink plenty of water.   Will you be a social outcast?  Have you met anyone who has an ostomy and is a social outcast? Why should you be the first? Only your attitude and self image will effect how you are treated. No confi-dent person is an Occupational psychologist.     PROFESSIONAL HELP  Resources are available if you need help or have questions about your ostomy.    Specially trained nurses called Wound, Ostomy Continence Nurses (WOCN) are available for consultation in most major medical centers.   Consider getting an ostomy consult at one of the outpatient ostomy clinics.  [facility] one in [place] at this time   [employer] (UOA) is a group made up of many local chapters throughout the [place]. These local groups hold meetings and provide support to prospective and existing ostomates. They sponsor educational events and have qualified visitors to make personal or telephone visits. Contact the UOA for the chapter nearest you and for other educational publications.   More detailed information can be found in Colostomy Guide, a publication of the [employer] (UOA). Contact UOA at [phone] or visit their web site at [email or web address]/. The website contains links to other sites, suppliers and resources.   [employer] Start Services:  Start at the website to enlist for support.  Your Wound Ostomy (WOCN) nurse may have started this process. [email or web address]://[email or web address].[email or web address]/securestart  Secure Start services are designed to support people as they live their lives with an  ostomy or neurogenic bladder. Enrolling is easy and at no cost to the patient. We realize that each person's needs and life journey are different. Through Secure Start services, we want to help people live their life, their way.

## 2017-04-08 DIAGNOSIS — Z932 Ileostomy status: Secondary | ICD-10-CM | POA: Diagnosis not present

## 2017-04-08 DIAGNOSIS — E860 Dehydration: Secondary | ICD-10-CM | POA: Diagnosis not present

## 2017-04-08 DIAGNOSIS — Z960 Presence of urogenital implants: Secondary | ICD-10-CM | POA: Diagnosis not present

## 2017-04-08 DIAGNOSIS — J9601 Acute respiratory failure with hypoxia: Secondary | ICD-10-CM | POA: Diagnosis not present

## 2017-04-08 DIAGNOSIS — Z452 Encounter for adjustment and management of vascular access device: Secondary | ICD-10-CM | POA: Diagnosis not present

## 2017-04-10 DIAGNOSIS — Z960 Presence of urogenital implants: Secondary | ICD-10-CM | POA: Diagnosis not present

## 2017-04-10 DIAGNOSIS — Z932 Ileostomy status: Secondary | ICD-10-CM | POA: Diagnosis not present

## 2017-04-10 DIAGNOSIS — Z452 Encounter for adjustment and management of vascular access device: Secondary | ICD-10-CM | POA: Diagnosis not present

## 2017-04-11 DIAGNOSIS — C50212 Malignant neoplasm of upper-inner quadrant of left female breast: Secondary | ICD-10-CM | POA: Diagnosis not present

## 2017-04-11 DIAGNOSIS — Z483 Aftercare following surgery for neoplasm: Secondary | ICD-10-CM | POA: Diagnosis not present

## 2017-04-11 DIAGNOSIS — Z932 Ileostomy status: Secondary | ICD-10-CM | POA: Diagnosis not present

## 2017-04-11 DIAGNOSIS — Z452 Encounter for adjustment and management of vascular access device: Secondary | ICD-10-CM | POA: Diagnosis not present

## 2017-04-11 DIAGNOSIS — Z960 Presence of urogenital implants: Secondary | ICD-10-CM | POA: Diagnosis not present

## 2017-04-11 DIAGNOSIS — C539 Malignant neoplasm of cervix uteri, unspecified: Secondary | ICD-10-CM | POA: Diagnosis not present

## 2017-04-13 DIAGNOSIS — Z452 Encounter for adjustment and management of vascular access device: Secondary | ICD-10-CM | POA: Diagnosis not present

## 2017-04-13 DIAGNOSIS — Z932 Ileostomy status: Secondary | ICD-10-CM | POA: Diagnosis not present

## 2017-04-13 DIAGNOSIS — Z960 Presence of urogenital implants: Secondary | ICD-10-CM | POA: Diagnosis not present

## 2017-04-15 ENCOUNTER — Encounter (HOSPITAL_COMMUNITY): Payer: Self-pay

## 2017-04-15 ENCOUNTER — Inpatient Hospital Stay (HOSPITAL_COMMUNITY)
Admission: EM | Admit: 2017-04-15 | Discharge: 2017-04-25 | DRG: 862 | Disposition: A | Discharge status: Home Health | Payer: BLUE CROSS/BLUE SHIELD | Attending: Surgery | Admitting: Surgery

## 2017-04-15 DIAGNOSIS — E119 Type 2 diabetes mellitus without complications: Secondary | ICD-10-CM

## 2017-04-15 DIAGNOSIS — Z6838 Body mass index (BMI) 38.0-38.9, adult: Secondary | ICD-10-CM | POA: Diagnosis not present

## 2017-04-15 DIAGNOSIS — Z853 Personal history of malignant neoplasm of breast: Secondary | ICD-10-CM | POA: Diagnosis not present

## 2017-04-15 DIAGNOSIS — T8149XA Infection following a procedure, other surgical site, initial encounter: Secondary | ICD-10-CM

## 2017-04-15 DIAGNOSIS — T378X5A Adverse effect of other specified systemic anti-infectives and antiparasitics, initial encounter: Secondary | ICD-10-CM | POA: Diagnosis present

## 2017-04-15 DIAGNOSIS — L0291 Cutaneous abscess, unspecified: Secondary | ICD-10-CM

## 2017-04-15 DIAGNOSIS — Z803 Family history of malignant neoplasm of breast: Secondary | ICD-10-CM

## 2017-04-15 DIAGNOSIS — Z833 Family history of diabetes mellitus: Secondary | ICD-10-CM

## 2017-04-15 DIAGNOSIS — D473 Essential (hemorrhagic) thrombocythemia: Secondary | ICD-10-CM

## 2017-04-15 DIAGNOSIS — Z881 Allergy status to other antibiotic agents status: Secondary | ICD-10-CM | POA: Diagnosis not present

## 2017-04-15 DIAGNOSIS — Z170 Estrogen receptor positive status [ER+]: Secondary | ICD-10-CM | POA: Diagnosis not present

## 2017-04-15 DIAGNOSIS — I10 Essential (primary) hypertension: Secondary | ICD-10-CM | POA: Diagnosis present

## 2017-04-15 DIAGNOSIS — C549 Malignant neoplasm of corpus uteri, unspecified: Secondary | ICD-10-CM | POA: Diagnosis present

## 2017-04-15 DIAGNOSIS — T451X5A Adverse effect of antineoplastic and immunosuppressive drugs, initial encounter: Secondary | ICD-10-CM | POA: Diagnosis present

## 2017-04-15 DIAGNOSIS — T814XXA Infection following a procedure, initial encounter: Principal | ICD-10-CM | POA: Diagnosis present

## 2017-04-15 DIAGNOSIS — R109 Unspecified abdominal pain: Secondary | ICD-10-CM | POA: Diagnosis not present

## 2017-04-15 DIAGNOSIS — D61818 Other pancytopenia: Secondary | ICD-10-CM | POA: Diagnosis not present

## 2017-04-15 DIAGNOSIS — Z880 Allergy status to penicillin: Secondary | ICD-10-CM | POA: Diagnosis not present

## 2017-04-15 DIAGNOSIS — Z7984 Long term (current) use of oral hypoglycemic drugs: Secondary | ICD-10-CM

## 2017-04-15 DIAGNOSIS — Z8249 Family history of ischemic heart disease and other diseases of the circulatory system: Secondary | ICD-10-CM

## 2017-04-15 DIAGNOSIS — Z79899 Other long term (current) drug therapy: Secondary | ICD-10-CM

## 2017-04-15 DIAGNOSIS — N368 Other specified disorders of urethra: Secondary | ICD-10-CM | POA: Diagnosis present

## 2017-04-15 DIAGNOSIS — Z9013 Acquired absence of bilateral breasts and nipples: Secondary | ICD-10-CM

## 2017-04-15 DIAGNOSIS — D649 Anemia, unspecified: Secondary | ICD-10-CM | POA: Diagnosis not present

## 2017-04-15 DIAGNOSIS — Z4682 Encounter for fitting and adjustment of non-vascular catheter: Secondary | ICD-10-CM | POA: Diagnosis not present

## 2017-04-15 DIAGNOSIS — Z90710 Acquired absence of both cervix and uterus: Secondary | ICD-10-CM

## 2017-04-15 DIAGNOSIS — Z882 Allergy status to sulfonamides status: Secondary | ICD-10-CM

## 2017-04-15 DIAGNOSIS — Z8542 Personal history of malignant neoplasm of other parts of uterus: Secondary | ICD-10-CM

## 2017-04-15 DIAGNOSIS — Z90722 Acquired absence of ovaries, bilateral: Secondary | ICD-10-CM

## 2017-04-15 DIAGNOSIS — Z808 Family history of malignant neoplasm of other organs or systems: Secondary | ICD-10-CM

## 2017-04-15 DIAGNOSIS — D709 Neutropenia, unspecified: Secondary | ICD-10-CM | POA: Diagnosis present

## 2017-04-15 DIAGNOSIS — Y92009 Unspecified place in unspecified non-institutional (private) residence as the place of occurrence of the external cause: Secondary | ICD-10-CM

## 2017-04-15 DIAGNOSIS — IMO0001 Reserved for inherently not codable concepts without codable children: Secondary | ICD-10-CM

## 2017-04-15 DIAGNOSIS — N2889 Other specified disorders of kidney and ureter: Secondary | ICD-10-CM

## 2017-04-15 DIAGNOSIS — Z9049 Acquired absence of other specified parts of digestive tract: Secondary | ICD-10-CM

## 2017-04-15 DIAGNOSIS — D75839 Thrombocytosis, unspecified: Secondary | ICD-10-CM

## 2017-04-15 DIAGNOSIS — E782 Mixed hyperlipidemia: Secondary | ICD-10-CM | POA: Diagnosis present

## 2017-04-15 DIAGNOSIS — K651 Peritoneal abscess: Secondary | ICD-10-CM | POA: Diagnosis present

## 2017-04-15 DIAGNOSIS — C50212 Malignant neoplasm of upper-inner quadrant of left female breast: Secondary | ICD-10-CM | POA: Diagnosis present

## 2017-04-15 DIAGNOSIS — Z7982 Long term (current) use of aspirin: Secondary | ICD-10-CM | POA: Diagnosis not present

## 2017-04-15 DIAGNOSIS — Z79811 Long term (current) use of aromatase inhibitors: Secondary | ICD-10-CM | POA: Diagnosis not present

## 2017-04-15 DIAGNOSIS — R232 Flushing: Secondary | ICD-10-CM | POA: Diagnosis present

## 2017-04-15 DIAGNOSIS — C569 Malignant neoplasm of unspecified ovary: Secondary | ICD-10-CM | POA: Diagnosis present

## 2017-04-15 DIAGNOSIS — K567 Ileus, unspecified: Secondary | ICD-10-CM

## 2017-04-15 DIAGNOSIS — Z923 Personal history of irradiation: Secondary | ICD-10-CM

## 2017-04-15 DIAGNOSIS — Z8543 Personal history of malignant neoplasm of ovary: Secondary | ICD-10-CM | POA: Diagnosis not present

## 2017-04-15 DIAGNOSIS — Z23 Encounter for immunization: Secondary | ICD-10-CM | POA: Diagnosis not present

## 2017-04-15 DIAGNOSIS — B9689 Other specified bacterial agents as the cause of diseases classified elsewhere: Secondary | ICD-10-CM | POA: Diagnosis not present

## 2017-04-15 DIAGNOSIS — E860 Dehydration: Secondary | ICD-10-CM | POA: Diagnosis not present

## 2017-04-15 DIAGNOSIS — B952 Enterococcus as the cause of diseases classified elsewhere: Secondary | ICD-10-CM | POA: Diagnosis present

## 2017-04-15 DIAGNOSIS — Z800 Family history of malignant neoplasm of digestive organs: Secondary | ICD-10-CM

## 2017-04-15 DIAGNOSIS — R1909 Other intra-abdominal and pelvic swelling, mass and lump: Secondary | ICD-10-CM | POA: Diagnosis not present

## 2017-04-15 DIAGNOSIS — R509 Fever, unspecified: Secondary | ICD-10-CM

## 2017-04-15 DIAGNOSIS — N739 Female pelvic inflammatory disease, unspecified: Secondary | ICD-10-CM | POA: Diagnosis present

## 2017-04-15 DIAGNOSIS — E440 Moderate protein-calorie malnutrition: Secondary | ICD-10-CM | POA: Diagnosis not present

## 2017-04-15 DIAGNOSIS — N135 Crossing vessel and stricture of ureter without hydronephrosis: Secondary | ICD-10-CM | POA: Diagnosis present

## 2017-04-15 DIAGNOSIS — L271 Localized skin eruption due to drugs and medicaments taken internally: Secondary | ICD-10-CM | POA: Diagnosis present

## 2017-04-15 DIAGNOSIS — Z9889 Other specified postprocedural states: Secondary | ICD-10-CM

## 2017-04-15 DIAGNOSIS — J9601 Acute respiratory failure with hypoxia: Secondary | ICD-10-CM | POA: Diagnosis not present

## 2017-04-15 DIAGNOSIS — R1900 Intra-abdominal and pelvic swelling, mass and lump, unspecified site: Secondary | ICD-10-CM | POA: Diagnosis present

## 2017-04-15 DIAGNOSIS — J189 Pneumonia, unspecified organism: Secondary | ICD-10-CM | POA: Diagnosis not present

## 2017-04-15 DIAGNOSIS — Z932 Ileostomy status: Secondary | ICD-10-CM | POA: Diagnosis not present

## 2017-04-15 NOTE — ED Triage Notes (Signed)
Pt had a temporary ileostomy done 2 weeks ago. They had to remove some bowel. Per her husband, she has not had any output out of her ileostomy since 10a. Pt also presents with a fever and a red, hot, itchy rash covering her upper abdomen and chest. She is A&Ox4. 2 mg dilaudid at 1830, 500 mg acetaminophen at 1700. She was discharged from inpatient care 1 week ago. Foley catheter in place and draining red urine.

## 2017-04-16 ENCOUNTER — Emergency Department (HOSPITAL_COMMUNITY): Payer: BLUE CROSS/BLUE SHIELD

## 2017-04-16 ENCOUNTER — Encounter (HOSPITAL_COMMUNITY): Payer: Self-pay | Admitting: Radiology

## 2017-04-16 ENCOUNTER — Inpatient Hospital Stay (HOSPITAL_COMMUNITY): Payer: BLUE CROSS/BLUE SHIELD

## 2017-04-16 DIAGNOSIS — N2889 Other specified disorders of kidney and ureter: Secondary | ICD-10-CM | POA: Diagnosis not present

## 2017-04-16 DIAGNOSIS — K567 Ileus, unspecified: Secondary | ICD-10-CM | POA: Diagnosis present

## 2017-04-16 DIAGNOSIS — B952 Enterococcus as the cause of diseases classified elsewhere: Secondary | ICD-10-CM | POA: Diagnosis present

## 2017-04-16 DIAGNOSIS — E119 Type 2 diabetes mellitus without complications: Secondary | ICD-10-CM | POA: Diagnosis not present

## 2017-04-16 DIAGNOSIS — E782 Mixed hyperlipidemia: Secondary | ICD-10-CM | POA: Diagnosis present

## 2017-04-16 DIAGNOSIS — N739 Female pelvic inflammatory disease, unspecified: Secondary | ICD-10-CM | POA: Diagnosis present

## 2017-04-16 DIAGNOSIS — Z923 Personal history of irradiation: Secondary | ICD-10-CM | POA: Diagnosis not present

## 2017-04-16 DIAGNOSIS — Y92009 Unspecified place in unspecified non-institutional (private) residence as the place of occurrence of the external cause: Secondary | ICD-10-CM | POA: Diagnosis not present

## 2017-04-16 DIAGNOSIS — R509 Fever, unspecified: Secondary | ICD-10-CM | POA: Diagnosis not present

## 2017-04-16 DIAGNOSIS — Z7982 Long term (current) use of aspirin: Secondary | ICD-10-CM | POA: Diagnosis not present

## 2017-04-16 DIAGNOSIS — L0291 Cutaneous abscess, unspecified: Secondary | ICD-10-CM | POA: Diagnosis present

## 2017-04-16 DIAGNOSIS — Z8542 Personal history of malignant neoplasm of other parts of uterus: Secondary | ICD-10-CM | POA: Diagnosis not present

## 2017-04-16 DIAGNOSIS — J9601 Acute respiratory failure with hypoxia: Secondary | ICD-10-CM | POA: Diagnosis not present

## 2017-04-16 DIAGNOSIS — D61818 Other pancytopenia: Secondary | ICD-10-CM | POA: Diagnosis present

## 2017-04-16 DIAGNOSIS — Z90710 Acquired absence of both cervix and uterus: Secondary | ICD-10-CM | POA: Diagnosis not present

## 2017-04-16 DIAGNOSIS — Z880 Allergy status to penicillin: Secondary | ICD-10-CM | POA: Diagnosis not present

## 2017-04-16 DIAGNOSIS — Z853 Personal history of malignant neoplasm of breast: Secondary | ICD-10-CM | POA: Diagnosis not present

## 2017-04-16 DIAGNOSIS — Z932 Ileostomy status: Secondary | ICD-10-CM | POA: Diagnosis not present

## 2017-04-16 DIAGNOSIS — Z23 Encounter for immunization: Secondary | ICD-10-CM | POA: Diagnosis not present

## 2017-04-16 DIAGNOSIS — J189 Pneumonia, unspecified organism: Secondary | ICD-10-CM | POA: Diagnosis not present

## 2017-04-16 DIAGNOSIS — Z87898 Personal history of other specified conditions: Secondary | ICD-10-CM

## 2017-04-16 DIAGNOSIS — E860 Dehydration: Secondary | ICD-10-CM | POA: Diagnosis not present

## 2017-04-16 DIAGNOSIS — Z8719 Personal history of other diseases of the digestive system: Secondary | ICD-10-CM

## 2017-04-16 DIAGNOSIS — Z4682 Encounter for fitting and adjustment of non-vascular catheter: Secondary | ICD-10-CM | POA: Diagnosis not present

## 2017-04-16 DIAGNOSIS — R1909 Other intra-abdominal and pelvic swelling, mass and lump: Secondary | ICD-10-CM | POA: Diagnosis not present

## 2017-04-16 DIAGNOSIS — Z8543 Personal history of malignant neoplasm of ovary: Secondary | ICD-10-CM | POA: Diagnosis not present

## 2017-04-16 DIAGNOSIS — R109 Unspecified abdominal pain: Secondary | ICD-10-CM | POA: Diagnosis not present

## 2017-04-16 DIAGNOSIS — Z79811 Long term (current) use of aromatase inhibitors: Secondary | ICD-10-CM | POA: Diagnosis not present

## 2017-04-16 DIAGNOSIS — Z6838 Body mass index (BMI) 38.0-38.9, adult: Secondary | ICD-10-CM | POA: Diagnosis not present

## 2017-04-16 DIAGNOSIS — T814XXA Infection following a procedure, initial encounter: Secondary | ICD-10-CM | POA: Diagnosis present

## 2017-04-16 DIAGNOSIS — E440 Moderate protein-calorie malnutrition: Secondary | ICD-10-CM | POA: Diagnosis present

## 2017-04-16 DIAGNOSIS — Z170 Estrogen receptor positive status [ER+]: Secondary | ICD-10-CM | POA: Diagnosis not present

## 2017-04-16 DIAGNOSIS — N135 Crossing vessel and stricture of ureter without hydronephrosis: Secondary | ICD-10-CM | POA: Diagnosis present

## 2017-04-16 DIAGNOSIS — Z881 Allergy status to other antibiotic agents status: Secondary | ICD-10-CM | POA: Diagnosis not present

## 2017-04-16 DIAGNOSIS — K651 Peritoneal abscess: Secondary | ICD-10-CM | POA: Diagnosis present

## 2017-04-16 DIAGNOSIS — B9689 Other specified bacterial agents as the cause of diseases classified elsewhere: Secondary | ICD-10-CM | POA: Diagnosis present

## 2017-04-16 HISTORY — DX: Female pelvic inflammatory disease, unspecified: N73.9

## 2017-04-16 HISTORY — DX: Personal history of other specified conditions: Z87.898

## 2017-04-16 HISTORY — DX: Personal history of other diseases of the digestive system: Z87.19

## 2017-04-16 LAB — GLUCOSE, CAPILLARY
GLUCOSE-CAPILLARY: 98 mg/dL (ref 65–99)
Glucose-Capillary: 136 mg/dL — ABNORMAL HIGH (ref 65–99)

## 2017-04-16 LAB — CBC WITH DIFFERENTIAL/PLATELET
BASOS PCT: 0 %
Basophils Absolute: 0 10*3/uL (ref 0.0–0.1)
EOS ABS: 0 10*3/uL (ref 0.0–0.7)
Eosinophils Relative: 0 %
HEMATOCRIT: 23.7 % — AB (ref 36.0–46.0)
HEMOGLOBIN: 7.8 g/dL — AB (ref 12.0–15.0)
LYMPHS PCT: 4 %
Lymphs Abs: 0.5 10*3/uL — ABNORMAL LOW (ref 0.7–4.0)
MCH: 28.8 pg (ref 26.0–34.0)
MCHC: 32.9 g/dL (ref 30.0–36.0)
MCV: 87.5 fL (ref 78.0–100.0)
Monocytes Absolute: 0.7 10*3/uL (ref 0.1–1.0)
Monocytes Relative: 5 %
NEUTROS ABS: 12.2 10*3/uL — AB (ref 1.7–7.7)
Neutrophils Relative %: 91 %
Platelets: 547 10*3/uL — ABNORMAL HIGH (ref 150–400)
RBC: 2.71 MIL/uL — ABNORMAL LOW (ref 3.87–5.11)
RDW: 15.8 % — ABNORMAL HIGH (ref 11.5–15.5)
WBC: 13.4 10*3/uL — ABNORMAL HIGH (ref 4.0–10.5)

## 2017-04-16 LAB — PROTIME-INR
INR: 1.21
PROTHROMBIN TIME: 15.2 s (ref 11.4–15.2)

## 2017-04-16 LAB — PHOSPHORUS: PHOSPHORUS: 2.9 mg/dL (ref 2.5–4.6)

## 2017-04-16 LAB — COMPREHENSIVE METABOLIC PANEL
ALBUMIN: 2 g/dL — AB (ref 3.5–5.0)
ALK PHOS: 265 U/L — AB (ref 38–126)
ALT: 13 U/L — AB (ref 14–54)
AST: 14 U/L — AB (ref 15–41)
Anion gap: 11 (ref 5–15)
BILIRUBIN TOTAL: 0.6 mg/dL (ref 0.3–1.2)
BUN: 13 mg/dL (ref 6–20)
CO2: 22 mmol/L (ref 22–32)
Calcium: 8.2 mg/dL — ABNORMAL LOW (ref 8.9–10.3)
Chloride: 102 mmol/L (ref 101–111)
Creatinine, Ser: 1.13 mg/dL — ABNORMAL HIGH (ref 0.44–1.00)
GFR calc Af Amer: 59 mL/min — ABNORMAL LOW (ref >60)
GFR calc non Af Amer: 51 mL/min — ABNORMAL LOW (ref >60)
GLUCOSE: 162 mg/dL — AB (ref 65–99)
POTASSIUM: 4.1 mmol/L (ref 3.5–5.1)
Sodium: 135 mmol/L (ref 135–145)
TOTAL PROTEIN: 6.3 g/dL — AB (ref 6.5–8.1)

## 2017-04-16 LAB — I-STAT CG4 LACTIC ACID, ED
Lactic Acid, Venous: 0.77 mmol/L (ref 0.5–1.9)
Lactic Acid, Venous: 0.79 mmol/L (ref 0.5–1.9)

## 2017-04-16 LAB — URINALYSIS, ROUTINE W REFLEX MICROSCOPIC
BILIRUBIN URINE: NEGATIVE
Glucose, UA: NEGATIVE mg/dL
Ketones, ur: NEGATIVE mg/dL
Nitrite: NEGATIVE
Protein, ur: 100 mg/dL — AB
Specific Gravity, Urine: 1.017 (ref 1.005–1.030)
pH: 6 (ref 5.0–8.0)

## 2017-04-16 LAB — MAGNESIUM: Magnesium: 1.1 mg/dL — ABNORMAL LOW (ref 1.7–2.4)

## 2017-04-16 MED ORDER — MIDAZOLAM HCL 2 MG/2ML IJ SOLN
INTRAMUSCULAR | Status: AC
  Filled 2017-04-16: qty 6

## 2017-04-16 MED ORDER — VANCOMYCIN HCL IN DEXTROSE 1-5 GM/200ML-% IV SOLN
1000.0000 mg | Freq: Once | INTRAVENOUS | Status: AC
  Administered 2017-04-16: 1000 mg via INTRAVENOUS
  Filled 2017-04-16: qty 200

## 2017-04-16 MED ORDER — SODIUM CHLORIDE 0.9% FLUSH
10.0000 mL | INTRAVENOUS | Status: DC | PRN
  Administered 2017-04-19 – 2017-04-22 (×3): 10 mL
  Filled 2017-04-16 (×3): qty 40

## 2017-04-16 MED ORDER — AZTREONAM 2 G IJ SOLR: 2.0000 g | Freq: Three times a day (TID) | INTRAMUSCULAR | Status: DC

## 2017-04-16 MED ORDER — METRONIDAZOLE IN NACL 5-0.79 MG/ML-% IV SOLN
500.0000 mg | Freq: Three times a day (TID) | INTRAVENOUS | Status: DC
  Administered 2017-04-16 – 2017-04-18 (×6): 500 mg via INTRAVENOUS
  Filled 2017-04-16 (×8): qty 100

## 2017-04-16 MED ORDER — HYDROMORPHONE HCL 1 MG/ML IJ SOLN
1.0000 mg | INTRAMUSCULAR | Status: DC | PRN
  Administered 2017-04-16 (×3): 1 mg via INTRAVENOUS
  Filled 2017-04-16 (×3): qty 1

## 2017-04-16 MED ORDER — INSULIN ASPART 100 UNIT/ML ~~LOC~~ SOLN
0.0000 [IU] | SUBCUTANEOUS | Status: DC
  Administered 2017-04-16 – 2017-04-17 (×2): 2 [IU] via SUBCUTANEOUS
  Administered 2017-04-17 (×2): 3 [IU] via SUBCUTANEOUS
  Administered 2017-04-17 (×2): 2 [IU] via SUBCUTANEOUS
  Administered 2017-04-17 – 2017-04-18 (×3): 3 [IU] via SUBCUTANEOUS

## 2017-04-16 MED ORDER — SODIUM CHLORIDE 0.9 % IV BOLUS (SEPSIS)
1000.0000 mL | Freq: Once | INTRAVENOUS | Status: AC
  Administered 2017-04-16: 1000 mL via INTRAVENOUS

## 2017-04-16 MED ORDER — HYDROMORPHONE HCL 1 MG/ML IJ SOLN
1.0000 mg | INTRAMUSCULAR | Status: AC | PRN
  Administered 2017-04-16 (×4): 1 mg via INTRAVENOUS
  Filled 2017-04-16 (×4): qty 1

## 2017-04-16 MED ORDER — FENTANYL CITRATE (PF) 100 MCG/2ML IJ SOLN
INTRAMUSCULAR | Status: AC
  Filled 2017-04-16: qty 4

## 2017-04-16 MED ORDER — MAGNESIUM SULFATE 4 GM/100ML IV SOLN
4.0000 g | Freq: Once | INTRAVENOUS | Status: AC
  Administered 2017-04-16: 4 g via INTRAVENOUS
  Filled 2017-04-16: qty 100

## 2017-04-16 MED ORDER — METRONIDAZOLE IN NACL 5-0.79 MG/ML-% IV SOLN: 500.0000 mg | Freq: Three times a day (TID) | INTRAVENOUS | Status: DC

## 2017-04-16 MED ORDER — PANTOPRAZOLE SODIUM 40 MG IV SOLR
40.0000 mg | Freq: Every day | INTRAVENOUS | Status: DC
  Administered 2017-04-16 – 2017-04-19 (×4): 40 mg via INTRAVENOUS
  Filled 2017-04-16 (×4): qty 40

## 2017-04-16 MED ORDER — TBO-FILGRASTIM 480 MCG/0.8ML ~~LOC~~ SOSY
480.0000 ug | PREFILLED_SYRINGE | SUBCUTANEOUS | Status: DC
  Administered 2017-04-18 – 2017-04-24 (×2): 480 ug via SUBCUTANEOUS
  Filled 2017-04-16 (×2): qty 0.8

## 2017-04-16 MED ORDER — SALINE SPRAY 0.65 % NA SOLN
1.0000 | NASAL | Status: DC | PRN
  Filled 2017-04-16: qty 44

## 2017-04-16 MED ORDER — IOPAMIDOL (ISOVUE-300) INJECTION 61%
INTRAVENOUS | Status: AC
  Filled 2017-04-16: qty 100

## 2017-04-16 MED ORDER — ENOXAPARIN SODIUM 40 MG/0.4ML ~~LOC~~ SOLN
40.0000 mg | SUBCUTANEOUS | Status: DC
  Administered 2017-04-17 – 2017-04-25 (×9): 40 mg via SUBCUTANEOUS
  Filled 2017-04-16 (×9): qty 0.4

## 2017-04-16 MED ORDER — HYDROMORPHONE HCL-NACL 0.5-0.9 MG/ML-% IV SOSY
1.0000 mg | PREFILLED_SYRINGE | INTRAVENOUS | Status: DC | PRN
  Administered 2017-04-16 – 2017-04-21 (×18): 1 mg via INTRAVENOUS
  Filled 2017-04-16 (×20): qty 2

## 2017-04-16 MED ORDER — FENTANYL CITRATE (PF) 100 MCG/2ML IJ SOLN
INTRAMUSCULAR | Status: AC | PRN
  Administered 2017-04-16: 50 ug via INTRAVENOUS
  Administered 2017-04-16 (×2): 25 ug via INTRAVENOUS

## 2017-04-16 MED ORDER — VANCOMYCIN HCL IN DEXTROSE 750-5 MG/150ML-% IV SOLN
750.0000 mg | Freq: Two times a day (BID) | INTRAVENOUS | Status: DC
  Administered 2017-04-16 – 2017-04-22 (×12): 750 mg via INTRAVENOUS
  Filled 2017-04-16 (×13): qty 150

## 2017-04-16 MED ORDER — POTASSIUM CHLORIDE 2 MEQ/ML IV SOLN
INTRAVENOUS | Status: DC
  Administered 2017-04-16 – 2017-04-20 (×3): via INTRAVENOUS
  Filled 2017-04-16 (×12): qty 1000

## 2017-04-16 MED ORDER — LEVOTHYROXINE SODIUM 100 MCG IV SOLR
75.0000 ug | Freq: Every day | INTRAVENOUS | Status: DC
  Filled 2017-04-16: qty 5

## 2017-04-16 MED ORDER — DIPHENHYDRAMINE HCL 25 MG PO CAPS: 25.0000 mg | ORAL_CAPSULE | Freq: Four times a day (QID) | ORAL | Status: DC | PRN

## 2017-04-16 MED ORDER — ONDANSETRON 4 MG PO TBDP
4.0000 mg | ORAL_TABLET | Freq: Four times a day (QID) | ORAL | Status: DC | PRN
  Administered 2017-04-22 – 2017-04-25 (×8): 4 mg via ORAL
  Filled 2017-04-16 (×8): qty 1

## 2017-04-16 MED ORDER — IOPAMIDOL (ISOVUE-300) INJECTION 61%
100.0000 mL | Freq: Once | INTRAVENOUS | Status: AC | PRN
  Administered 2017-04-16: 100 mL via INTRAVENOUS

## 2017-04-16 MED ORDER — ONDANSETRON HCL 4 MG/2ML IJ SOLN
4.0000 mg | Freq: Four times a day (QID) | INTRAMUSCULAR | Status: DC | PRN
  Administered 2017-04-16 – 2017-04-21 (×14): 4 mg via INTRAVENOUS
  Filled 2017-04-16 (×14): qty 2

## 2017-04-16 MED ORDER — DEXTROSE 5 % IV SOLN
2.0000 g | Freq: Three times a day (TID) | INTRAVENOUS | Status: DC
  Administered 2017-04-16 – 2017-04-18 (×5): 2 g via INTRAVENOUS
  Filled 2017-04-16 (×12): qty 2

## 2017-04-16 MED ORDER — DIPHENHYDRAMINE HCL 50 MG/ML IJ SOLN
25.0000 mg | Freq: Four times a day (QID) | INTRAMUSCULAR | Status: DC | PRN
  Administered 2017-04-16 – 2017-04-18 (×4): 25 mg via INTRAVENOUS
  Filled 2017-04-16 (×4): qty 1

## 2017-04-16 MED ORDER — ONDANSETRON HCL 4 MG/2ML IJ SOLN
4.0000 mg | Freq: Once | INTRAMUSCULAR | Status: AC
  Administered 2017-04-16: 4 mg via INTRAVENOUS
  Filled 2017-04-16: qty 2

## 2017-04-16 MED ORDER — VANCOMYCIN HCL IN DEXTROSE 1-5 GM/200ML-% IV SOLN: 1000.0000 mg | Freq: Two times a day (BID) | INTRAVENOUS | Status: DC

## 2017-04-16 MED ORDER — SODIUM CHLORIDE 0.9% FLUSH
5.0000 mL | Freq: Three times a day (TID) | INTRAVENOUS | Status: DC
  Administered 2017-04-16 – 2017-04-24 (×14): 5 mL via INTRAVENOUS

## 2017-04-16 MED ORDER — GENTAMICIN SULFATE 40 MG/ML IJ SOLN: 2.5000 mg/kg | Freq: Two times a day (BID) | INTRAVENOUS | Status: DC

## 2017-04-16 MED ORDER — VANCOMYCIN HCL IN DEXTROSE 1-5 GM/200ML-% IV SOLN: 1000.0000 mg | Freq: Once | INTRAVENOUS | Status: DC

## 2017-04-16 MED ORDER — LEVOTHYROXINE SODIUM 100 MCG IV SOLR
75.0000 ug | Freq: Every day | INTRAVENOUS | Status: DC
  Administered 2017-04-16 – 2017-04-19 (×4): 75 ug via INTRAVENOUS
  Filled 2017-04-16 (×6): qty 5

## 2017-04-16 MED ORDER — FAT EMULSION 20 % IV EMUL
240.0000 mL | INTRAVENOUS | Status: AC
  Administered 2017-04-16: 240 mL via INTRAVENOUS
  Filled 2017-04-16: qty 250

## 2017-04-16 MED ORDER — DIPHENHYDRAMINE HCL 50 MG/ML IJ SOLN
25.0000 mg | Freq: Once | INTRAMUSCULAR | Status: AC
  Administered 2017-04-16: 25 mg via INTRAVENOUS

## 2017-04-16 MED ORDER — DIPHENHYDRAMINE HCL 50 MG/ML IJ SOLN
INTRAMUSCULAR | Status: AC
  Administered 2017-04-16: 25 mg via INTRAVENOUS
  Filled 2017-04-16: qty 1

## 2017-04-16 MED ORDER — MIDAZOLAM HCL 2 MG/2ML IJ SOLN
INTRAMUSCULAR | Status: AC | PRN
  Administered 2017-04-16 (×2): 1 mg via INTRAVENOUS

## 2017-04-16 MED ORDER — TRACE MINERALS CR-CU-MN-SE-ZN 10-1000-500-60 MCG/ML IV SOLN
INTRAVENOUS | Status: AC
  Administered 2017-04-16: 18:00:00 via INTRAVENOUS
  Filled 2017-04-16: qty 960

## 2017-04-16 MED ORDER — VANCOMYCIN HCL IN DEXTROSE 750-5 MG/150ML-% IV SOLN: 750.0000 mg | Freq: Two times a day (BID) | INTRAVENOUS | Status: DC

## 2017-04-16 MED ORDER — HYDROCODONE-ACETAMINOPHEN 5-325 MG PO TABS: 1.0000 | ORAL_TABLET | ORAL | Status: DC | PRN

## 2017-04-16 MED ORDER — ENOXAPARIN SODIUM 40 MG/0.4ML ~~LOC~~ SOLN: 40.0000 mg | SUBCUTANEOUS | Status: DC

## 2017-04-16 NOTE — Progress Notes (Signed)
A consult was received from an ED physician for Vancomycin per pharmacy dosing.  The patient's profile has been reviewed for ht/wt/allergies/indication/available labs.   A one time order has been placed for Vancomycin 1gm iv x1.  Further antibiotics/pharmacy consults should be ordered by admitting physician if indicated.                       Thank you,  Cyndia Diver PharmD, BCPS  04/16/2017, 6:52 AM

## 2017-04-16 NOTE — H&P (Signed)
[name] is an 61 y.o. female.    Chief Complaint: nausea, abdominal pain postop  HPI: patient is an unfortunate 61 year old female with a history of endometrial/ovarian cancer diagnosed in 2006, status post TAH/BSO and chemotherapy. She developed a pelvic recurrence in 2016 and underwent resection with colectomy and colostomy and radiation to the pelvis. She had a ureteral injury at that time and a chronic stricture of the right ureter. 2 weeks ago she underwent laparoscopic takedown of her colostomy with anastomosis. Also 5 hour lysis of adhesions, iliocecal ectomy and diverting ileostomy by Dr. Johney Maine. She underwent reimplantation of her right ureter by Dr. Tresa Moore. She was discharged 1 week ago. Essentially since discharge she has had gradually increasing initially abdominal bloating and fullness which has progressed to abdominal pain mostly in the upper left abdomen over the last several days and increasing fullness and nausea to her she has been unable to eat and had 1 episode of vomiting. Ileostomy had been functioning well but no output over the last 24 hours. She has had some low-grade fever. She describes foul-smelling rectal drainage. These symptoms have gradually progressed and she presented to the emergency department.  Past Medical History:  Diagnosis Date  . Acute bacterial bronchitis 06/04/2015  . Anemia in neoplastic disease   . Benign essential HTN   . Breast cancer of upper-inner quadrant of left female breast ([place])   . 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 Cancer of corpus uteri, except isthmus [facility]) dx 10-15-2004 oncologist-- dr Denman [name] and dr Alvy Bimler     dx endometrial and ovarian cancer s/p  chemotheapy and surgery:  recurrent 11-19-2014 w/ radiation 01-29-2015 to 03-10-2015  . Cellulitis  of left abdominal wall near colostomy 12/03/2014  . Chronic idiopathic neutropenia (HCC)    presumed related to chemotherapy March 2006--- followed by dr Alvy Bimler   . Complication of anesthesia    PONV  . Diabetic retinopathy, background ([place])   . DM type 2 (diabetes mellitus, type 2) ([place])    monitored by dr Legrand Como altheimer  . Dysuria   . GERD (gastroesophageal reflux disease)    takes omeprazole  . Hiatal hernia   . History of bronchitis   . History of gastric polyp    2014  duodenum  . History of radiation therapy    01-29-2015 to 03-10-2015  pelvis 50.4Gy  . Hypothyroidism    monitored by dr Legrand Como altheimer  . Mixed dyslipidemia   . Multiple thyroid nodules    Managed by Dr. Harlow Asa  . PONV (postoperative nausea and vomiting)    "scopolamine patch works for me"  . Radiation-induced dermatitis    contact dermatitis , radiation completed, rash only on ankles now.  . Right flank pain   . S/P colostomy ([place])   . Seasonal allergies   . Shoulder stiffness    bilateral   . Ureteral stricture, right UROLOGIT-  DR [facility]   CHRONIC--  TREATMENT URETERAL STENT  . Vitamin D deficiency   . Wears glasses     Past Surgical History:  Procedure Laterality Date  . APPENDECTOMY    . BREAST RECONSTRUCTION WITH PLACEMENT OF TISSUE EXPANDER AND FLEX HD (ACELLULAR HYDRATED DERMIS) Bilateral 12/11/2015   Procedure: BILATERAL BREAST RECONSTRUCTION WITH PLACEMENT OF TISSUE EXPANDERS;  Surgeon: Irene Limbo, MD;  Location: [facility];  Service: Clinical cytogeneticist;  Laterality: Bilateral;  . COLONOSCOPY WITH PROPOFOL N/A 08/21/2013   Procedure: COLONOSCOPY WITH PROPOFOL;  Surgeon: Cleotis Nipper, MD;  [facility];  Service: Endoscopy;  Laterality: N/A;  . COLOSTOMY TAKEDOWN N/A 12/04/2014   Procedure: LAPROSCOPIC LYSIS OF ADHESIONS, SPLENIC MOBILIZATION, RELOCATION OF COLOSTOMY, DEBRIDEMENT INITIAL COLOSTOMY SITE;  Surgeon: Michael [name], MD;  [facility];  Service: General;  Laterality: N/A;   . CYSTOSCOPY W/ RETROGRADES Right 11/21/2015   Procedure: CYSTOSCOPY WITH RETROGRADE PYELOGRAM;  Surgeon: Alexis Frock, MD;  [facility];  Service: Urology;  Laterality: Right;  . CYSTOSCOPY W/ URETERAL STENT PLACEMENT Right 11/21/2015   Procedure: CYSTOSCOPY WITH STENT REPLACEMENT;  Surgeon: Alexis Frock, MD;  [facility];  Service: Urology;  Laterality: Right;  . CYSTOSCOPY W/ URETERAL STENT PLACEMENT Right 03/10/2016   Procedure: CYSTOSCOPY WITH STENT REPLACEMENT;  Surgeon: Alexis Frock, MD;  Location: [facility];  Service: Urology;  Laterality: Right;  . CYSTOSCOPY W/ URETERAL STENT PLACEMENT Right 06/30/2016   Procedure: CYSTOSCOPY WITH RETROGRADE PYELOGRAM/URETERAL STENT EXCHANGE;  Surgeon: Alexis Frock, MD;  Location: [facility];  Service: Urology;  Laterality: Right;  . CYSTOSCOPY WITH RETROGRADE PYELOGRAM, URETEROSCOPY AND STENT PLACEMENT Right 03/20/2015   Procedure: CYSTOSCOPY WITH RETROGRADE PYELOGRAM, URETEROSCOPY WITH BALLOON DILATION AND STENT PLACEMENT ON RIGHT;  Surgeon: Alexis Frock, MD;  Location: [facility];  Service: Urology;  Laterality: Right;  . CYSTOSCOPY WITH RETROGRADE PYELOGRAM, URETEROSCOPY AND STENT PLACEMENT Right 05/02/2015   Procedure: CYSTOSCOPY WITH RIGHT RETROGRADE PYELOGRAM,  DIAGNOSTIC URETEROSCOPY AND STENT PULL ;  Surgeon: Alexis Frock, MD;  Location: [facility];  Service: Urology;  Laterality: Right;  . CYSTOSCOPY WITH RETROGRADE PYELOGRAM, URETEROSCOPY AND STENT PLACEMENT Right 09/05/2015   Procedure: CYSTOSCOPY WITH RETROGRADE PYELOGRAM,  AND STENT PLACEMENT;  Surgeon: Alexis Frock, MD;  [facility];  Service: Urology;  Laterality: Right;  . CYSTOSCOPY WITH RETROGRADE PYELOGRAM, URETEROSCOPY AND STENT PLACEMENT Right 04/01/2017   Procedure: CYSTOSCOPY WITH RETROGRADE PYELOGRAM, URETEROSCOPY AND STENT PLACEMENT;  Surgeon: Alexis Frock, MD;  [facility];  Service:  Urology;  Laterality: Right;  . CYSTOSCOPY WITH STENT PLACEMENT Right 10/27/2016   Procedure: CYSTOSCOPY WITH STENT CHANGE and right retrograde pyelogram;  Surgeon: Alexis Frock, MD;  Location: [facility];  Service: Urology;  Laterality: Right;  . EUS N/A 10/02/2014   Procedure: LOWER ENDOSCOPIC ULTRASOUND (EUS);  Surgeon: Arta Silence, MD;  Location: [facility];  Service: Endoscopy;  Laterality: N/A;  . EXCISION SOFT TISSUE MASS RIGHT FOREMAN  12-08-2006  . EYE SURGERY  as child   pytosis of eyelids repair  . INCISION AND DRAINAGE OF WOUND Bilateral 12/26/2015   Procedure: DEBRIDEMENT OF BILATERAL MASTECTOMY FLAPS;  Surgeon: Irene Limbo, MD;  Location: [facility];  Service: Plastics;  Laterality: Bilateral;  . IR FLUORO GUIDE CV LINE RIGHT  04/06/2017  . IR US GUIDE VASC ACCESS RIGHT  04/06/2017  . LAPAROSCOPIC CHOLECYSTECTOMY  1990  . LIPOSUCTION WITH LIPOFILLING Bilateral 04/16/2016   Procedure: LIPOSUCTION WITH LIPOFILLING TO BILATERAL CHEST;  Surgeon: Irene Limbo, MD;  Location: [facility];  Service: Plastics;  Laterality: Bilateral;  . MASTECTOMY Right 12/11/2015   PROPHYLACTIC   . MASTECTOMY COMPLETE / SIMPLE W/ SENTINEL NODE BIOPSY Left 12/11/2015   AXILLARY SENTINEL LYMPH NODE BIOPSY   . MASTECTOMY W/ SENTINEL NODE BIOPSY Bilateral 12/11/2015   Procedure: RIGHT PROPHYLACTIC MASTECTOMY, LEFT TOTAL MASTECTOMY WITH LEFT AXILLARY SENTINEL LYMPH NODE BIOPSY;  Surgeon: Stark Klein, MD;  Location: [facility];  Service: General;  Laterality: Bilateral;  . OSTOMY N/A 11/19/2014   Procedure: OSTOMY;  Surgeon: Michael [name], MD;  [facility];  Service: General;  Laterality: N/A;  . PROCTOSCOPY N/A 04/01/2017   Procedure: RIDGE PROCTOSCOPY;  Surgeon: Michael [name], MD;  [facility];  Service: General;  Laterality: N/A;  . REMOVAL OF BILATERAL TISSUE EXPANDERS WITH PLACEMENT OF BILATERAL BREAST IMPLANTS Bilateral 04/16/2016   Procedure:  REMOVAL OF BILATERAL TISSUE EXPANDERS WITH PLACEMENT OF BILATERAL BREAST IMPLANTS;  Surgeon: Irene Limbo, MD;  Location: [facility];  Service: Plastics;  Laterality: Bilateral;  . ROBOTIC ASSISTED LAP VAGINAL HYSTERECTOMY N/A 11/19/2014   Procedure: ROBOTIC LYSIS OF ADHESIONS, CONVERTED TO LAPAROTOMY RADICAL UPPER VAGINECTOMY,LOW ANTERIOR BOWEL RESECTION, COLOSTOMY, BILATERAL URETERAL STENT PLACEMENT AND CYSTONOMY CLOSURE;  Surgeon: Everitt Amber, MD;  [facility];  Service: Gynecology;  Laterality: N/A;  . TISSUE EXPANDER FILLING Bilateral 12/26/2015   Procedure: EXPANSION OF BILATERAL CHEST TISSUE EXPANDERS (60 mL- Right; 75 mL- Left);  Surgeon: Irene Limbo, MD;  Location: [facility];  Service: Plastics;  Laterality: Bilateral;  . TONSILLECTOMY    . TOTAL ABDOMINAL HYSTERECTOMY  March 2006   [facility]   and Bilateral Salpingoophorectomy/  staging for Ovarian cancer/  an  . XI ROBOTIC ASSISTED LOWER ANTERIOR RESECTION N/A 04/01/2017   Procedure: XI ROBOTIC VS LAPAROSCOPIC COLOSTOMY TAKEDOWN WITH LYSIS OF ADHESIONS.;  Surgeon: Michael [name], MD;  [facility];  Service: General;  Laterality: N/A;  ERAS PATHWAY    Family History  Problem Relation Age of Onset  . Cancer Mother 12       stomach ca  . Hypertension Mother   . Cancer Father 75       prostate ca  . Diabetes Father   . Heart disease Father        CABG  . Breast cancer Maternal Aunt        dx in her 104s  . Lymphoma Paternal Aunt   . Brain cancer Paternal Grandfather   . Ovarian cancer Other   . Diabetes Sister   . Hypertension Brother y-10  . Heart disease Brother        CABG  . Diabetes Brother    Social History:  reports that she has never smoked. She has never used smokeless tobacco. She reports that she drinks alcohol. Her drug history is not on file.  Allergies:  Allergies  Allergen Reactions  . Penicillins Swelling    Facial swelling Has patient had a PCN reaction causing  immediate rash, facial/tongue/throat swelling, SOB or lightheadedness with hypotension: Yes Has patient had a PCN reaction causing severe rash involving mucus membranes or skin necrosis: Yes Has patient had a PCN reaction that required hospitalization No Has patient had a PCN reaction occurring within the last 10 years: No If all of the above answers are "NO", then may proceed with Cephalosporin use.   [name] Kitchen Ultram [Tramadol] Hives  . Adhesive [Tape]     blisters  . Cefaclor Rash    Ceclor  . Erythromycin     Gastritis, abd cramps  . Trimethoprim Rash  . Ciprofloxacin Other (See Comments)    Unknown On Dr notes   . Oxycodone     " I just feel weird"  . Pectin Rash    Pectin ring for stoma  . Sulfa Antibiotics Rash     (Not in a hospital admission)  Results for orders placed  or performed during the hospital encounter of 04/15/17 (from the past 48 hour(s))  Urinalysis, Routine w reflex microscopic     Status: Abnormal   Collection Time: 04/15/17 11:56 PM  Result Value Ref Range   Color, Urine RED (A) YELLOW    Comment: BIOCHEMICALS MAY BE AFFECTED BY COLOR   APPearance TURBID (A) CLEAR   Specific Gravity, Urine 1.017 1.005 - 1.030   pH 6.0 5.0 - 8.0   Glucose, UA NEGATIVE NEGATIVE mg/dL   Hgb urine dipstick LARGE (A) NEGATIVE   Bilirubin Urine NEGATIVE NEGATIVE   Ketones, ur NEGATIVE NEGATIVE mg/dL   Protein, ur 100 (A) NEGATIVE mg/dL   Nitrite NEGATIVE NEGATIVE   Leukocytes, UA MODERATE (A) NEGATIVE   RBC / HPF TOO NUMEROUS TO COUNT 0 - 5 RBC/hpf   WBC, UA TOO NUMEROUS TO COUNT 0 - 5 WBC/hpf   Bacteria, UA MANY (A) NONE SEEN   Squamous Epithelial / LPF 0-5 (A) NONE SEEN   WBC Clumps PRESENT   I-Stat CG4 Lactic Acid, ED     Status: None   Collection Time: 04/16/17 12:45 AM  Result Value Ref Range   Lactic Acid, Venous 0.77 0.5 - 1.9 mmol/L  Comprehensive metabolic panel     Status: Abnormal   Collection Time: 04/16/17 12:47 AM  Result Value Ref Range   Sodium 135 135  - 145 mmol/L   Potassium 4.1 3.5 - 5.1 mmol/L   Chloride 102 101 - 111 mmol/L   CO2 22 22 - 32 mmol/L   Glucose, Bld 162 (H) 65 - 99 mg/dL   BUN 13 6 - 20 mg/dL   Creatinine, Ser 1.13 (H) 0.44 - 1.00 mg/dL   Calcium 8.2 (L) 8.9 - 10.3 mg/dL   Total Protein 6.3 (L) 6.5 - 8.1 g/dL   Albumin 2.0 (L) 3.5 - 5.0 g/dL   AST 14 (L) 15 - 41 U/L   ALT 13 (L) 14 - 54 U/L   Alkaline Phosphatase 265 (H) 38 - 126 U/L   Total Bilirubin 0.6 0.3 - 1.2 mg/dL   GFR calc non Af Amer 51 (L) >60 mL/min   GFR calc Af Amer 59 (L) >60 mL/min    Comment: (NOTE) The eGFR has been calculated using the CKD EPI equation. This calculation has not been validated in all clinical situations. eGFR's persistently <60 mL/min signify possible Chronic Kidney Disease.    Anion gap 11 5 - 15  CBC with Differential     Status: Abnormal   Collection Time: 04/16/17 12:47 AM  Result Value Ref Range   WBC 13.4 (H) 4.0 - 10.5 K/uL   RBC 2.71 (L) 3.87 - 5.11 MIL/uL   Hemoglobin 7.8 (L) 12.0 - 15.0 g/dL   HCT 23.7 (L) 36.0 - 46.0 %   MCV 87.5 78.0 - 100.0 fL   MCH 28.8 26.0 - 34.0 pg   MCHC 32.9 30.0 - 36.0 g/dL   RDW 15.8 (H) 11.5 - 15.5 %   Platelets 547 (H) 150 - 400 K/uL   Neutrophils Relative % 91 %   Lymphocytes Relative 4 %   Monocytes Relative 5 %   Eosinophils Relative 0 %   Basophils Relative 0 %   Neutro Abs 12.2 (H) 1.7 - 7.7 K/uL   Lymphs Abs 0.5 (L) 0.7 - 4.0 K/uL   Monocytes Absolute 0.7 0.1 - 1.0 K/uL   Eosinophils Absolute 0.0 0.0 - 0.7 K/uL   Basophils Absolute 0.0 0.0 - 0.1 K/uL   WBC  Morphology DOHLE BODIES   Protime-INR     Status: None   Collection Time: 04/16/17 12:47 AM  Result Value Ref Range   Prothrombin Time 15.2 11.4 - 15.2 seconds   INR 1.21   I-Stat CG4 Lactic Acid, ED     Status: None   Collection Time: 04/16/17  5:25 AM  Result Value Ref Range   Lactic Acid, Venous 0.79 0.5 - 1.9 mmol/L   Dg Chest 2 View  Result Date: 04/16/2017 CLINICAL DATA:  Fever EXAM: CHEST  2 VIEW  COMPARISON:  April 01, 2017 FINDINGS: Central catheter tip is in the superior vena cava. No pneumothorax. There is no edema or consolidation. The heart size and pulmonary vascularity are normal. No adenopathy. No bone lesions. IMPRESSION: No edema or consolidation. Central catheter tip in superior vena cava. No pneumothorax. Electronically Signed   By: Lowella Grip III M.D.   On: 04/16/2017 00:35   Dg Abdomen 1 View  Result Date: 04/16/2017 CLINICAL DATA:  NGT placement. EXAM: ABDOMEN - 1 VIEW COMPARISON:  CT earlier this day. FINDINGS: Tip and side port of the enteric tube below the diaphragm in the stomach. Prominent small bowel in the left upper quadrant again seen. Right mid abdominal ostomy. Excreted intravenous contrast in the left renal collecting system and ureter which appear dilated. Right ureteral stent in place. Surgical clips in the pelvis. IMPRESSION: Tip and side port of the enteric tube below the diaphragm in the stomach. Electronically Signed   By: Jeb Levering M.D.   On: 04/16/2017 05:39   Ct Abdomen Pelvis W Contrast  Result Date: 04/16/2017 CLINICAL DATA:  Abdominal pain. Decreased ostomy output. Post low anterior resection 2 weeks prior. EXAM: CT ABDOMEN AND PELVIS WITH CONTRAST TECHNIQUE: Multidetector CT imaging of the abdomen and pelvis was performed using the standard protocol following bolus administration of intravenous contrast. CONTRAST:  157m ISOVUE-300 IOPAMIDOL (ISOVUE-300) INJECTION 61% COMPARISON:  CT 01/27/2017 FINDINGS: Lower chest: Bilateral lower lobe airspace opacities with air bronchograms, right greater than left. Trace bilateral pleural effusions. Hepatobiliary: No focal hepatic lesion. Postcholecystectomy with similar biliary prominence prior exam, common bile duct measures 15 mm. Normal tapering to the duodenal insertion. Pancreas: No ductal dilatation or inflammation. Spleen: Normal in size without focal abnormality. Adrenals/Urinary Tract: Normal right  adrenal gland. Unchanged left adrenal adenoma Right ureteral stent in place. Decreased hydronephrosis from prior exam. There is increased left hydronephrosis from prior. Left ureter is dilated to the level of pelvic brim. Urinary bladder is decompressed by Foley catheter. Air in the bladder and right renal collecting system consistent with recent procedure. Stomach/Bowel: Lack of enteric contrast limits detailed assessment. 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 Stomach is physiologically distended. There are fluid-filled prominent small bowel loops in the left abdomen with associated mesenteric edema. Multiple small foci of air in the left small bowel mesentery in the region of inflammatory change. Stapled off sigmoid colon in the pelvis. There is an adjacent irregular fluid collection in the presacral space, ill-defined margins. Measurements difficult to define due to irregular nature but measures approximately 5 cm. Additional pelvic fluid collection anterior to the urinary bladder containing air measures 8.1 x 7.3 cm. Vascular/Lymphatic: Normal caliber abdominal aorta. Multiple small retroperitoneal lymph nodes. No evidence pelvic adenopathy. Reproductive: Status post hysterectomy. No adnexal masses. Other: Presacral air fluid collection, ill-defined. Pelvic air fluid collection measures 8.1 x 7.3 cm anterior to the urinary bladder. Postsurgical change in the anterior abdominal wall with multiple foci of subcutaneous air.  Ill-defined fluid in the anterior upper abdominal wall without confluent well-defined abscess. Musculoskeletal: There are no acute or suspicious osseous abnormalities. IMPRESSION: 1. Post low anterior resection 2 weeks prior with pelvic abscess anterior to the bladder measuring 8.1 x 7.3 cm. Additionally ill-defined air fluid collection adjacent to the stapled off sigmoid colon in the presacral space. 2. Edematous small bowel loops in the left abdomen with mesenteric edema  and multiple foci of mesenteric air. Extraluminal air should have resolved weeks postsurgical, raising concern for ischemia or bowel perforation. 3. Mild increase in left hydroureteronephrosis, with transition at the pelvic brim. Decreased right hydronephrosis with right ureteral stent in place. 4. Scattered air in the subcutaneous tissues of the anterior abdominal wall, which may be postoperative, however unusual duration. These results were called by telephone at the time of interpretation on 04/16/2017 at 3:32 am to Dr. Delora Fuel , who verbally acknowledged these results. Electronically Signed   By: Jeb Levering M.D.   On: 04/16/2017 03:33    Review of Systems  Constitutional: Positive for fever. Negative for chills.  Respiratory: Negative for cough and shortness of breath.   Cardiovascular: Negative for chest pain and palpitations.  Gastrointestinal: Positive for abdominal pain, nausea and vomiting. Negative for blood in stool.  Skin: Positive for rash.    Blood pressure (!) 131/59, pulse 85, temperature 99.8 F (37.7 C), temperature source Oral, resp. rate 17, SpO2 96 %. Physical Exam  General: Obese Caucasian female in no acute distress Skin: Diffuse raised erythematous rash across her anterior chest and upper abdomen. HEENT: No palpable masses. Sclera nonicteric. Tongue is coated. Lungs: Clear equal breath sounds without wheezing or increased work of breathing Cardiac: Regular rate and rhythm. No edema. Breasts: Reconstructed bilaterally Abdomen: Ileostomy right abdomen. There is moderate tenderness in the upper and mid abdomen greater on the left side but no peritoneal signs and minimal guarding. Bowel sounds are active. Mild distention. Incisions: Laparoscopic incisions healing with some minimal eschar over the old JP site right lower quadrant Rectal: Moderately tender. There is induration and possibly stricture at the fingertip. No drainage noted. Extremities: No edema or joint  swelling Neurologic: She is alert and fully oriented. Affect normal.No gross motor deficits.  Assessment/Plan 2 weeks status post complicated robotic colostomy takedown with ileocecectomy and diverting ileostomy as well as right ureteral reimplantation. Extensive lysis of adhesions and extensive radiation changes in the pelvis. She now presents with fever and partial obstruction or ileus with CT scan showing some dots of air in the mesentery of the small bowel in the left upper abdomen as well as to significant fluid collections in the pelvis. Air adjacent tothe small bowel could represent small bowel leak or injury. Not sure how much the pelvic fluid collections could be contributing to signs of infection. There does not appear to be any free intraperitoneal air. She does not appear to be septic at present. Reoperation at 2 weeks postoperatively would be very difficult and I think risk further injury. As she appears stable I recommended bowel rest with NG suction, TNA and broad spectrum antibiotics. She has multiple antibiotic allergies. We will give  gentamicin and clindamycin.We will ask interventional radiology regarding drainage of her pelvic fluid collections. If she worsens she may require laparotomy. This was discussed with the patient and her husband and all questions answered.  Edward Jolly, MD 04/16/2017, 7:03 AM

## 2017-04-16 NOTE — ED Notes (Signed)
Attempted to call report, spoke with charge RN [name]. Ann RN to call back.

## 2017-04-16 NOTE — ED Notes (Signed)
Pt is post-op x 2 weeks, had a colostomy take down and ileostomy created.  Pt reports she started to have mild abd pain yesterday, worsened today.  Reports the severity of the pain comes in waves.  Pt moans and groans in pain.  Pt's abd is very tender.

## 2017-04-16 NOTE — Progress Notes (Signed)
Pharmacy Antibiotic Note  [name] is a 61 y.o. female with endometrial/ovarian cancer admitted on 04/15/2017 with fever and partial obstruction or ileus.  Pharmacy has been consulted for vancomycin and aztreonam dosing for intraabdomal infection; MD dosing flagyl.  Plan: Vancomycin 1g IV x 1, then 750mg  IV q12h Check trough at steady state, goal 15-20 mcg/ml Aztreonam 2g IV q8h Flagyl 500mg  IV q8h per MD Follow up renal function & cultures    Temp (24hrs), Avg:100.6 F (38.1 C), Min:99.6 F (37.6 C), Max:102.3 F (39.1 C)   Recent Labs Lab 04/16/17 0045 04/16/17 0047 04/16/17 0525  WBC  --  13.4*  --   CREATININE  --  1.13*  --   LATICACIDVEN 0.77  --  0.79    Estimated Creatinine Clearance: 56.5 mL/min (A) (by C-G formula based on SCr of 1.13 mg/dL (H)).    Allergies  Allergen Reactions  . Penicillins Swelling    Facial swelling Has patient had a PCN reaction causing immediate rash, facial/tongue/throat swelling, SOB or lightheadedness with hypotension: Yes Has patient had a PCN reaction causing severe rash involving mucus membranes or skin necrosis: Yes Has patient had a PCN reaction that required hospitalization No Has patient had a PCN reaction occurring within the last 10 years: No If all of the above answers are "NO", then may proceed with Cephalosporin use.   [name] Kitchen Ultram [Tramadol] Hives  . Adhesive [Tape]     blisters  . Cefaclor Rash    Ceclor  . Erythromycin     Gastritis, abd cramps  . Trimethoprim Rash  . Ciprofloxacin Other (See Comments)    Unknown On Dr notes   . Oxycodone     " I just feel weird"  . Pectin Rash    Pectin ring for stoma  . Sulfa Antibiotics Rash    Antimicrobials this admission:  Vancomycin 04/16/2017 >> Aztreonam 04/16/2017 >>  Flagyl 04/16/2017 >>  Dose adjustments this admission:    Microbiology results:  9/15 BCx: sent  Thank you for allowing pharmacy to be a part of this patient's care.  Peggyann Juba, PharmD,  BCPS Pager: [phone] 04/16/2017 9:13 AM

## 2017-04-16 NOTE — H&P (Signed)
Chief Complaint: Abdominal pain Nausea  Referring Physician(s): Excell Seltzer  Supervising Physician: Corrie Mckusick  Patient Status: [facility] - ED  History of Present Illness: [name] is a 61 y.o. female with a history of endometrial/ovarian cancer diagnosed in 2006, status post TAH/BSO and chemotherapy.   She developed a pelvic recurrence in 2016 and underwent resection with colectomy and colostomy and radiation to the pelvis. She had a ureteral injury at that time and a chronic stricture of the right ureter.  2 weeks ago she underwent laparoscopic takedown of her colostomy with anastomosis. Also 5 hour lysis of adhesions, iliocecal ectomy and diverting ileostomy by Dr. Johney Maine.   She underwent reimplantation of her right ureter by Dr. Tresa Moore. She was discharged 1 week ago.   Since discharge she has had gradually increasing initially abdominal bloating and fullness which has progressed to abdominal pain mostly in the upper left abdomen.  Over the last several days and increasing fullness and nausea.  She has been unable to eat and had 1 episode of vomiting.   Ileostomy had been functioning well but no output over the last 24 hours.   She has a low-grade fever. WBC is 13.  She describes foul-smelling rectal drainage.   CT shows pelvic abscess anterior to the bladder measuring 8.1 x 7.3 cm. Additionally ill-defined air fluid collection adjacent to the stapled off sigmoid colon in the presacral space.  We are asked to evaluate for aspiration/drainage/drain placement.   Past Medical History:  Diagnosis Date  . Acute bacterial bronchitis 06/04/2015  . Anemia in neoplastic disease   . Benign essential HTN   . Breast cancer of upper-inner quadrant of left female breast ([place])   . Breast cancer, left 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 Cancer of corpus uteri, except isthmus [facility]) dx 10-15-2004 oncologist-- dr Denman [name] and dr Alvy Bimler     dx endometrial and ovarian cancer s/p  chemotheapy and surgery:  recurrent 11-19-2014 w/ radiation 01-29-2015 to 03-10-2015  . Cellulitis of left abdominal wall near colostomy 12/03/2014  . Chronic idiopathic neutropenia (HCC)    presumed related to chemotherapy March 2006--- followed by dr Alvy Bimler   . Complication of anesthesia    PONV  . Diabetic retinopathy, background ([place])   . DM type 2 (diabetes mellitus, type 2) ([place])    monitored by dr Legrand Como altheimer  . Dysuria   . GERD (gastroesophageal reflux disease)    takes omeprazole  . Hiatal hernia   . History of bronchitis   . History of gastric polyp    2014  duodenum  . History of radiation therapy    01-29-2015 to 03-10-2015  pelvis 50.4Gy  . Hypothyroidism    monitored by dr Legrand Como altheimer  . Mixed dyslipidemia   . Multiple thyroid nodules    Managed by Dr. Harlow Asa  . PONV (postoperative nausea and vomiting)    "scopolamine patch works for me"  . Radiation-induced dermatitis    contact dermatitis , radiation completed, rash only on ankles now.  . Right flank pain   . S/P colostomy ([place])   . Seasonal allergies   . Shoulder stiffness    bilateral   . Ureteral stricture, right UROLOGIT-  DR [facility]   CHRONIC--  TREATMENT URETERAL STENT  . Vitamin D deficiency   . Wears glasses  Past Surgical History:  Procedure Laterality Date  . APPENDECTOMY    . BREAST RECONSTRUCTION WITH PLACEMENT OF TISSUE EXPANDER AND FLEX HD (ACELLULAR HYDRATED DERMIS) Bilateral 12/11/2015   Procedure: BILATERAL BREAST RECONSTRUCTION WITH PLACEMENT OF TISSUE EXPANDERS;  Surgeon: Irene Limbo, MD;  Location: [place];  Service: Plastics;  Laterality: Bilateral;  . COLONOSCOPY WITH PROPOFOL N/A 08/21/2013   Procedure: COLONOSCOPY WITH PROPOFOL;  Surgeon: Cleotis Nipper, MD;  [facility];   Service: Endoscopy;  Laterality: N/A;  . COLOSTOMY TAKEDOWN N/A 12/04/2014   Procedure: LAPROSCOPIC LYSIS OF ADHESIONS, SPLENIC MOBILIZATION, RELOCATION OF COLOSTOMY, DEBRIDEMENT INITIAL COLOSTOMY SITE;  Surgeon: Michael [name], MD;  [facility];  Service: General;  Laterality: N/A;  . CYSTOSCOPY W/ RETROGRADES Right 11/21/2015   Procedure: CYSTOSCOPY WITH RETROGRADE PYELOGRAM;  Surgeon: Alexis Frock, MD;  [facility];  Service: Urology;  Laterality: Right;  . CYSTOSCOPY W/ URETERAL STENT PLACEMENT Right 11/21/2015   Procedure: CYSTOSCOPY WITH STENT REPLACEMENT;  Surgeon: Alexis Frock, MD;  [facility];  Service: Urology;  Laterality: Right;  . CYSTOSCOPY W/ URETERAL STENT PLACEMENT Right 03/10/2016   Procedure: CYSTOSCOPY WITH STENT REPLACEMENT;  Surgeon: Alexis Frock, MD;  Location: [facility];  Service: Urology;  Laterality: Right;  . CYSTOSCOPY W/ URETERAL STENT PLACEMENT Right 06/30/2016   Procedure: CYSTOSCOPY WITH RETROGRADE PYELOGRAM/URETERAL STENT EXCHANGE;  Surgeon: Alexis Frock, MD;  Location: [facility];  Service: Urology;  Laterality: Right;  . CYSTOSCOPY WITH RETROGRADE PYELOGRAM, URETEROSCOPY AND STENT PLACEMENT Right 03/20/2015   Procedure: CYSTOSCOPY WITH RETROGRADE PYELOGRAM, URETEROSCOPY WITH BALLOON DILATION AND STENT PLACEMENT ON RIGHT;  Surgeon: Alexis Frock, MD;  Location: [facility];  Service: Urology;  Laterality: Right;  . CYSTOSCOPY WITH RETROGRADE PYELOGRAM, URETEROSCOPY AND STENT PLACEMENT Right 05/02/2015   Procedure: CYSTOSCOPY WITH RIGHT RETROGRADE PYELOGRAM,  DIAGNOSTIC URETEROSCOPY AND STENT PULL ;  Surgeon: Alexis Frock, MD;  Location: [facility];  Service: Urology;  Laterality: Right;  . CYSTOSCOPY WITH RETROGRADE PYELOGRAM, URETEROSCOPY AND STENT PLACEMENT Right 09/05/2015   Procedure: CYSTOSCOPY WITH RETROGRADE PYELOGRAM,  AND STENT PLACEMENT;  Surgeon: Alexis Frock, MD;  [facility];  Service: Urology;  Laterality: Right;  . CYSTOSCOPY WITH RETROGRADE PYELOGRAM, URETEROSCOPY AND STENT PLACEMENT Right 04/01/2017   Procedure: CYSTOSCOPY WITH RETROGRADE PYELOGRAM, URETEROSCOPY AND STENT PLACEMENT;  Surgeon: Alexis Frock, MD;  [facility];  Service: Urology;  Laterality: Right;  . CYSTOSCOPY WITH STENT PLACEMENT Right 10/27/2016   Procedure: CYSTOSCOPY WITH STENT CHANGE and right retrograde pyelogram;  Surgeon: Alexis Frock, MD;  Location: [facility];  Service: Urology;  Laterality: Right;  . EUS N/A 10/02/2014   Procedure: LOWER ENDOSCOPIC ULTRASOUND (EUS);  Surgeon: Arta Silence, MD;  Location: [facility];  Service: Endoscopy;  Laterality: N/A;  . EXCISION SOFT TISSUE MASS RIGHT FOREMAN  12-08-2006  . EYE SURGERY  as child   pytosis of eyelids repair  . INCISION AND DRAINAGE OF WOUND Bilateral 12/26/2015   Procedure: DEBRIDEMENT OF BILATERAL MASTECTOMY FLAPS;  Surgeon: Irene Limbo, MD;  Location: [place];  Service: Plastics;  Laterality: Bilateral;  . IR FLUORO GUIDE CV LINE RIGHT  04/06/2017  . IR US GUIDE VASC ACCESS RIGHT  04/06/2017  . LAPAROSCOPIC CHOLECYSTECTOMY  1990  . LIPOSUCTION WITH LIPOFILLING Bilateral 04/16/2016   Procedure: LIPOSUCTION WITH LIPOFILLING TO BILATERAL CHEST;  Surgeon: Irene Limbo, MD;  Location: [place];  Service: Plastics;  Laterality: Bilateral;  . MASTECTOMY Right 12/11/2015  PROPHYLACTIC   . MASTECTOMY COMPLETE / SIMPLE W/ SENTINEL NODE BIOPSY Left 12/11/2015   AXILLARY SENTINEL LYMPH NODE BIOPSY   . MASTECTOMY W/ SENTINEL NODE BIOPSY Bilateral 12/11/2015   Procedure: RIGHT PROPHYLACTIC MASTECTOMY, LEFT TOTAL MASTECTOMY WITH LEFT AXILLARY SENTINEL LYMPH NODE BIOPSY;  Surgeon: Stark Klein, MD;  Location: [place];  Service: General;  Laterality: Bilateral;  . OSTOMY N/A 11/19/2014   Procedure: OSTOMY;  Surgeon: Michael [name], MD;  [facility];  Service: General;   Laterality: N/A;  . PROCTOSCOPY N/A 04/01/2017   Procedure: RIDGE PROCTOSCOPY;  Surgeon: Michael [name], MD;  [facility];  Service: General;  Laterality: N/A;  . REMOVAL OF BILATERAL TISSUE EXPANDERS WITH PLACEMENT OF BILATERAL BREAST IMPLANTS Bilateral 04/16/2016   Procedure: REMOVAL OF BILATERAL TISSUE EXPANDERS WITH PLACEMENT OF BILATERAL BREAST IMPLANTS;  Surgeon: Irene Limbo, MD;  Location: [place];  Service: Plastics;  Laterality: Bilateral;  . ROBOTIC ASSISTED LAP VAGINAL HYSTERECTOMY N/A 11/19/2014   Procedure: ROBOTIC LYSIS OF ADHESIONS, CONVERTED TO LAPAROTOMY RADICAL UPPER VAGINECTOMY,LOW ANTERIOR BOWEL RESECTION, COLOSTOMY, BILATERAL URETERAL STENT PLACEMENT AND CYSTONOMY CLOSURE;  Surgeon: Everitt Amber, MD;  [facility];  Service: Gynecology;  Laterality: N/A;  . TISSUE EXPANDER FILLING Bilateral 12/26/2015   Procedure: EXPANSION OF BILATERAL CHEST TISSUE EXPANDERS (60 mL- Right; 75 mL- Left);  Surgeon: Irene Limbo, MD;  Location: [place];  Service: Plastics;  Laterality: Bilateral;  . TONSILLECTOMY    . TOTAL ABDOMINAL HYSTERECTOMY  March 2006   [facility]   and Bilateral Salpingoophorectomy/  staging for Ovarian cancer/  an  . XI ROBOTIC ASSISTED LOWER ANTERIOR RESECTION N/A 04/01/2017   Procedure: XI ROBOTIC VS LAPAROSCOPIC COLOSTOMY TAKEDOWN WITH LYSIS OF ADHESIONS.;  Surgeon: Michael [name], MD;  [facility];  Service: General;  Laterality: N/A;  ERAS PATHWAY    Allergies: Penicillins; Ultram [tramadol]; Adhesive [tape]; Cefaclor; Erythromycin; Trimethoprim; Ciprofloxacin; Oxycodone; Pectin; and Sulfa antibiotics  Medications: Prior to Admission medications   Medication Sig Start Date End Date Taking? Authorizing Provider  anastrozole (ARIMIDEX) 1 MG tablet TAKE 1 TABLET DAILY 12/08/16  Yes Heath Lark, MD  aspirin EC 81 MG tablet Take 81 mg by mouth daily.   Yes [provider]  Biotin 5 MG TABS Take 5 mg by mouth  every morning.    Yes [provider]  Calcium-Magnesium-Vitamin D 400-166.7-133.3 MG-MG-UNIT TABS Take 1 tablet by mouth daily.    Yes [provider]  Cholecalciferol (VITAMIN D3) 10000 UNITS capsule Take 10,000 Units by mouth once a week. Sunday evening's   Yes [provider]  diphenhydrAMINE (BENADRYL) 25 MG tablet Take 25 mg by mouth at bedtime as needed for itching or sleep. Reported on 09/15/2015   Yes [provider]  filgrastim (NEUPOGEN) 480 MCG/1.6ML injection Inject 1.6 ml under the skin every 5 days for life Patient taking differently: Inject 480 mcg into the skin See admin instructions. Inject 1.6 ml under the skin every 6 days for life 01/06/17  Yes Gorsuch, Ni, MD  fluconazole (DIFLUCAN) 200 MG tablet Take 200 mg by mouth daily. 04/12/17  Yes [provider]  HYDROmorphone (DILAUDID) 2 MG tablet Take 1-2 tablets (2-4 mg total) by mouth every 4 (four) hours as needed for moderate pain or severe pain. 04/07/17  Yes Michael [name], MD  levothyroxine (SYNTHROID, LEVOTHROID) 150 MCG tablet Take 150 mcg by mouth daily.   Yes [provider]  loperamide (IMODIUM) 2 MG capsule Take 1-2 capsules (2-4 mg total)  by mouth every 8 (eight) hours as needed for diarrhea or loose stools (Use if >2 BM every 8 hours). 04/07/17  Yes Michael [name], MD  loratadine (CLARITIN) 10 MG tablet Take 10 mg by mouth daily.    Yes [provider]  metFORMIN (GLUCOPHAGE) 1000 MG tablet Take 1,000 mg by mouth 2 (two) times daily with a meal.    Yes [provider]  methocarbamol (ROBAXIN) 500 MG tablet Take 500 mg by mouth every 4 (four) hours as needed for muscle spasms.  04/14/17  Yes [provider]  omega-3 acid ethyl esters (LOVAZA) 1 G capsule Take 1 g by mouth 2 (two) times daily.   Yes [provider]  ondansetron (ZOFRAN-ODT) 4 MG disintegrating tablet Take 4 mg by mouth every 6 (six) hours as needed for nausea or vomiting.   04/12/17  Yes [provider]  Polyethyl Glycol-Propyl Glycol (SYSTANE OP) Place 1 drop into both eyes at bedtime.    Yes [provider]  Prenatal Vit-Fe Fumarate-FA (PRENATAL VITAMIN PO) Take 1 capsule by mouth daily. Takes prenatal because there are no dyes in it   Yes [provider]  rosuvastatin (CRESTOR) 10 MG tablet Take 10 mg by mouth every evening.    Yes [provider]  sitaGLIPtin (JANUVIA) 100 MG tablet Take 100 mg by mouth every morning.    Yes [provider]  losartan (COZAAR) 100 MG tablet Take 1 tablet (100 mg total) by mouth daily. Patient not taking: Reported on 04/16/2017 03/18/17   Heath Lark, MD     Family History  Problem Relation Age of Onset  . Cancer Mother 30       stomach ca  . Hypertension Mother   . Cancer Father 91       prostate ca  . Diabetes Father   . Heart disease Father        CABG  . Breast cancer Maternal Aunt        dx in her 47s  . Lymphoma Paternal Aunt   . Brain cancer Paternal Grandfather   . Ovarian cancer Other   . Diabetes Sister   . Hypertension Brother y-10  . Heart disease Brother        CABG  . Diabetes Brother     Social History   Social History  . Marital status: Married    Spouse name: N/A  . Number of children: 1  . Years of education: N/A   Occupational History  . retired Therapist, sports from [place] History Main Topics  . Smoking status: Never Smoker  . Smokeless tobacco: Never Used  . Alcohol use Yes     Comment: rare social  . Drug use: Unknown  . Sexual activity: Not Currently   Other Topics Concern  . None   Social History Narrative   Exercise-- has not gotten back into it since cancer came back    Review of Systems: A 12 point ROS discussed  Review of Systems  Constitutional: Positive for activity change, appetite change, fatigue and fever.  HENT: Negative.   Respiratory: Negative.   Cardiovascular: Negative.   Gastrointestinal: Positive for abdominal  pain, nausea and vomiting.  Genitourinary: Negative.   Musculoskeletal: Negative.   Skin: Negative.   Neurological: Negative.   Hematological: Negative.   Psychiatric/Behavioral: Negative.     Vital Signs: BP (!) 120/54   Pulse 84   Temp 99.8 F (37.7 C) (Oral)   Resp 18  SpO2 97%   Physical Exam  Constitutional: She is oriented to person, place, and time.  Obese, NAD  HENT:  Head: Normocephalic and atraumatic.  Eyes: EOM are normal.  Neck: Normal range of motion.  Cardiovascular: Normal rate and regular rhythm.   Pulmonary/Chest: Effort normal. No respiratory distress.  Abdominal: Soft. She exhibits no distension.  Musculoskeletal: Normal range of motion.  Neurological: She is alert and oriented to person, place, and time.  Skin: Skin is warm and dry.  Psychiatric: She has a normal mood and affect. Her behavior is normal. Judgment and thought content normal.  Vitals reviewed.   Imaging: Dg Chest 2 View  Result Date: 04/16/2017 CLINICAL DATA:  Fever EXAM: CHEST  2 VIEW COMPARISON:  April 01, 2017 FINDINGS: Central catheter tip is in the superior vena cava. No pneumothorax. There is no edema or consolidation. The heart size and pulmonary vascularity are normal. No adenopathy. No bone lesions. IMPRESSION: No edema or consolidation. Central catheter tip in superior vena cava. No pneumothorax. Electronically Signed   By: Lowella Grip III M.D.   On: 04/16/2017 00:35   Dg Abdomen 1 View  Result Date: 04/16/2017 CLINICAL DATA:  NGT placement. EXAM: ABDOMEN - 1 VIEW COMPARISON:  CT earlier this day. FINDINGS: Tip and side port of the enteric tube below the diaphragm in the stomach. Prominent small bowel in the left upper quadrant again seen. Right mid abdominal ostomy. Excreted intravenous contrast in the left renal collecting system and ureter which appear dilated. Right ureteral stent in place. Surgical clips in the pelvis. IMPRESSION: Tip and side port of the enteric tube  below the diaphragm in the stomach. Electronically Signed   By: Jeb Levering M.D.   On: 04/16/2017 05:39   Dg Abd 1 View  Result Date: 04/02/2017 CLINICAL DATA:  OG placement EXAM: ABDOMEN - 1 VIEW COMPARISON:  Earlier films of same day FINDINGS: Orogastric tube extends to the gastric body, stomach decompressed. Normal bowel gas pattern. Stable right double-J ureteral stent. Drain catheter loops over the lower abdomen. Cholecystectomy clips. Multiple surgical clips project over the sacrum. No abnormal abdominal calcifications. Regional bones unremarkable. IMPRESSION: 1. Stable position of orogastric tube, in the gastric body Electronically Signed   By: Lucrezia Europe M.D.   On: 04/02/2017 11:12   Dg Abd 1 View  Result Date: 04/02/2017 CLINICAL DATA:  Orogastric tube placement EXAM: ABDOMEN - 1 VIEW COMPARISON:  Earlier film of the same day FINDINGS: Orogastric tube extends to the body of the decompressed stomach. Normal bowel gas pattern. Stable right double-J ureteral stent. Drain catheter projects over the lower abdomen. Cholecystectomy clips. Multiple surgical clips project over the sacrum. No abnormal abdominal calcifications. Regional bones unremarkable. IMPRESSION: 1. Orogastric tube to the body of the stomach. Electronically Signed   By: Lucrezia Europe M.D.   On: 04/02/2017 11:11   Dg Abd 1 View  Result Date: 04/01/2017 CLINICAL DATA:  Endometrial cancer. EXAM: ABDOMEN - 1 VIEW; DG C-ARM 1-60 MIN-NO REPORT Radiation exposure index:  21.67 mGy. COMPARISON:  CT scan of January 27, 2017. FINDINGS: Six fluoroscopic images were obtained during cystoscopy. These images demonstrate exchange of right-sided ureteral stent which extends from right intrarenal collecting system to urinary bladder. IMPRESSION: Successful exchange of right-sided ureteral stent. Electronically Signed   By: Marijo Conception, M.D.   On: 04/01/2017 09:11   Ct Abdomen Pelvis W Contrast  Result Date: 04/16/2017 CLINICAL DATA:  Abdominal  pain. Decreased ostomy output. Post low anterior resection  2 weeks prior. EXAM: CT ABDOMEN AND PELVIS WITH CONTRAST TECHNIQUE: Multidetector CT imaging of the abdomen and pelvis was performed using the standard protocol following bolus administration of intravenous contrast. CONTRAST:  177m ISOVUE-300 IOPAMIDOL (ISOVUE-300) INJECTION 61% COMPARISON:  CT 01/27/2017 FINDINGS: Lower chest: Bilateral lower lobe airspace opacities with air bronchograms, right greater than left. Trace bilateral pleural effusions. Hepatobiliary: No focal hepatic lesion. Postcholecystectomy with similar biliary prominence prior exam, common bile duct measures 15 mm. Normal tapering to the duodenal insertion. Pancreas: No ductal dilatation or inflammation. Spleen: Normal in size without focal abnormality. Adrenals/Urinary Tract: Normal right adrenal gland. Unchanged left adrenal adenoma Right ureteral stent in place. Decreased hydronephrosis from prior exam. There is increased left hydronephrosis from prior. Left ureter is dilated to the level of pelvic brim. Urinary bladder is decompressed by Foley catheter. Air in the bladder and right renal collecting system consistent with recent procedure. Stomach/Bowel: Lack of enteric contrast limits detailed assessment. 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 Stomach is physiologically distended. There are fluid-filled prominent small bowel loops in the left abdomen with associated mesenteric edema. Multiple small foci of air in the left small bowel mesentery in the region of inflammatory change. Stapled off sigmoid colon in the pelvis. There is an adjacent irregular fluid collection in the presacral space, ill-defined margins. Measurements difficult to define due to irregular nature but measures approximately 5 cm. Additional pelvic fluid collection anterior to the urinary bladder containing air measures 8.1 x 7.3 cm. Vascular/Lymphatic: Normal caliber abdominal aorta. Multiple  small retroperitoneal lymph nodes. No evidence pelvic adenopathy. Reproductive: Status post hysterectomy. No adnexal masses. Other: Presacral air fluid collection, ill-defined. Pelvic air fluid collection measures 8.1 x 7.3 cm anterior to the urinary bladder. Postsurgical change in the anterior abdominal wall with multiple foci of subcutaneous air. Ill-defined fluid in the anterior upper abdominal wall without confluent well-defined abscess. Musculoskeletal: There are no acute or suspicious osseous abnormalities. IMPRESSION: 1. Post low anterior resection 2 weeks prior with pelvic abscess anterior to the bladder measuring 8.1 x 7.3 cm. Additionally ill-defined air fluid collection adjacent to the stapled off sigmoid colon in the presacral space. 2. Edematous small bowel loops in the left abdomen with mesenteric edema and multiple foci of mesenteric air. Extraluminal air should have resolved weeks postsurgical, raising concern for ischemia or bowel perforation. 3. Mild increase in left hydroureteronephrosis, with transition at the pelvic brim. Decreased right hydronephrosis with right ureteral stent in place. 4. Scattered air in the subcutaneous tissues of the anterior abdominal wall, which may be postoperative, however unusual duration. These results were called by telephone at the time of interpretation on 04/16/2017 at 3:32 am to Dr. DDelora Fuel, who verbally acknowledged these results. Electronically Signed   By: MJeb LeveringM.D.   On: 04/16/2017 03:33   Ir Fluoro Guide Cv Line Right  Result Date: 04/06/2017 INDICATION: Patient with history of recurrent endometrial cancer; status post resection, partial vaginectomy, low anterior resection, colostomy 11/19/14; status post colostomy takedown/loop ileostomy diversion 04/01/17; central venous access requested for fluid administration. EXAM: RIGHT UPPER EXTREMITY PICC LINE PLACEMENT WITH ULTRASOUND AND FLUOROSCOPIC GUIDANCE MEDICATIONS: 1% lidocaine to skin and  subcutaneous tissue ANESTHESIA/SEDATION: None FLUOROSCOPY TIME:  Fluoroscopy Time:  42 seconds (6 mGy). COMPLICATIONS: None immediate. PROCEDURE: The right arm was prepped with chlorhexidine, draped in the usual sterile fashion using maximum barrier technique (cap and mask, sterile gown, sterile gloves, large sterile sheet, hand hygiene and cutaneous antisepsis) and infiltrated locally with 1% Lidocaine.  Ultrasound demonstrated patency of the right brachial vein, and this was documented with an image. Under real-time ultrasound guidance, this vein was accessed with a 21 gauge micropuncture needle and image documentation was performed. A 0.018 wire was introduced in to the vein. Over this, a 5 [place] double lumen power injectable PICC was advanced to the lower SVC/right atrial junction. Fluoroscopy during the procedure and fluoro spot radiograph confirms appropriate catheter position. The catheter was flushed and covered with a sterile dressing. Catheter length:  35 cm IMPRESSION: Successful right arm Power PICC line placement with ultrasound and fluoroscopic guidance. The catheter is ready for use. Read by: Rowe Robert, PA-C Electronically Signed   By: Corrie Mckusick D.O.   On: 04/06/2017 14:45   Ir US Guide Vasc Access Right  Result Date: 04/06/2017 INDICATION: Patient with history of recurrent endometrial cancer; status post resection, partial vaginectomy, low anterior resection, colostomy 11/19/14; status post colostomy takedown/loop ileostomy diversion 04/01/17; central venous access requested for fluid administration. EXAM: RIGHT UPPER EXTREMITY PICC LINE PLACEMENT WITH ULTRASOUND AND FLUOROSCOPIC GUIDANCE MEDICATIONS: 1% lidocaine to skin and subcutaneous tissue ANESTHESIA/SEDATION: None FLUOROSCOPY TIME:  Fluoroscopy Time:  42 seconds (6 mGy). COMPLICATIONS: None immediate. PROCEDURE: The right arm was prepped with chlorhexidine, draped in the usual sterile fashion using maximum barrier technique (cap and  mask, sterile gown, sterile gloves, large sterile sheet, hand hygiene and cutaneous antisepsis) and infiltrated locally with 1% Lidocaine. Ultrasound demonstrated patency of the right brachial vein, and this was documented with an image. Under real-time ultrasound guidance, this vein was accessed with a 21 gauge micropuncture needle and image documentation was performed. A 0.018 wire was introduced in to the vein. Over this, a 5 [place] double lumen power injectable PICC was advanced to the lower SVC/right atrial junction. Fluoroscopy during the procedure and fluoro spot radiograph confirms appropriate catheter position. The catheter was flushed and covered with a sterile dressing. Catheter length:  35 cm IMPRESSION: Successful right arm Power PICC line placement with ultrasound and fluoroscopic guidance. The catheter is ready for use. Read by: Rowe Robert, PA-C Electronically Signed   By: Corrie Mckusick D.O.   On: 04/06/2017 14:45   Portable Chest Xray  Result Date: 04/01/2017 CLINICAL DATA:  Acute respiratory failure EXAM: PORTABLE CHEST 1 VIEW COMPARISON:  08/25/2014 FINDINGS: Endotracheal tube tip is about 14 mm superior to the carina. Esophageal tube tip is below the diaphragm but not included on the image. Right-sided central venous catheter tip overlies the distal SVC. Hazy perihilar infiltrates. No effusion. No pneumothorax. IMPRESSION: 1. Endotracheal tube tip about 1.4 cm superior to carina 2. Hazy perihilar opacity may reflect atelectasis or mild perihilar infiltrates. Electronically Signed   By: Donavan Foil M.D.   On: 04/01/2017 23:04   Dg Abd Portable 1v  Result Date: 04/02/2017 CLINICAL DATA:  OG tube placement EXAM: PORTABLE ABDOMEN - 1 VIEW COMPARISON:  None available FINDINGS: OG tube within the collapsed stomach body in the left abdomen. Right ureteral stent in place. Jejunal feeding tube over the lower abdomen and pelvis. Nonobstructive bowel gas pattern. IMPRESSION: OG tube within the  stomach. Electronically Signed   By: Jerilynn Mages.  Shick M.D.   On: 04/02/2017 10:26   Dg C-arm 1-60 Min-no Report  Result Date: 04/01/2017 Fluoroscopy was utilized by the requesting physician.  No radiographic interpretation.    Labs:  CBC:  Recent Labs  04/05/17 0317 04/06/17 0538 04/07/17 0410 04/16/17 0047  WBC 4.7 2.6* 15.0* 13.4*  HGB 7.7* 7.4* 7.8* 7.8*  HCT 22.7* 21.9* 23.5* 23.7*  PLT 157 168 225 547*    COAGS:  Recent Labs  04/16/17 0047  INR 1.21    BMP:  Recent Labs  04/05/17 0317 04/06/17 0538 04/07/17 0410 04/16/17 0047  NA 139 140 143 135  K 3.6 3.4* 3.4* 4.1  CL 108 108 107 102  CO2 _0 GLUCOSE 145* 129* 108* 162*  BUN 21* _1 CALCIUM 8.1* 8.2* 8.5* 8.2*  CREATININE 0.97 0.79 0.99 1.13*  GFRNONAA >60 >60 60* 51*  GFRAA >60 >60 >60 59*    LIVER FUNCTION TESTS:  Recent Labs  09/28/16 1340 10/07/16 0929 01/24/17 1228 04/16/17 0047  BILITOT 0.27 0.31 0.34 0.6  AST _2 14*  ALT _3 13*  ALKPHOS 95 123 90 265*  PROT 7.4 8.2 6.9 6.3*  ALBUMIN 3.5 3.8 3.4* 2.0*    TUMOR MARKERS: No results for input(s): AFPTM, CEA, CA199, CHROMGRNA in the last 8760 hours.  Assessment and Plan:  Pelvic Abscess  Will proceed with Image guided drainage/drain placement today by Dr. Earleen Newport or Dr. Pascal Lux.  Risks and benefits discussed with the patient including bleeding, infection, damage to adjacent structures, bowel perforation/fistula connection, and sepsis.  All of the patient's questions were answered, patient is agreeable to proceed. Consent signed and in chart.  Thank you for this interesting consult.  I greatly enjoyed meeting [facility] and look forward to participating in their care.  A copy of this report was sent to the requesting provider on this date.  Electronically Signed: Murrell Redden, PA-C 04/16/2017, 8:41 AM   I spent a total of 20 Minutes in face to face in clinical consultation, greater than 50% of which was  counseling/coordinating care for CT drain.

## 2017-04-16 NOTE — Progress Notes (Signed)
PHARMACY - ADULT TOTAL PARENTERAL NUTRITION CONSULT NOTE   Pharmacy Consult for TPN Indication: Bowel obstruction  Patient Measurements:  Weight (04/07/17): 96.3kg  Height (04/01/17): 62.5 inches   There is no height or weight on file to calculate BMI. 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 On 8/31 underwent laparoscopic takedown of her colostomy with anastomosis, LOA, iliocecal ectomy and diverting ileostomy by Dr. Johney Maine. Also underwent reimplantation of her right ureter by Dr. Tresa Moore.  Now presents with fever and partial obstruction or ileus. Surgery recommended bowel rest with NG suction, TNA and broad spectrum antibiotics. Consulting IR regarding drainage of her pelvic fluid collections.  Significant events:   Today:    Glucose: Hx DM on metformin and sitagliptin PTA; serum glucose elevated 162  Electrolytes: K 4.1, corrected Ca 9.8, Mg/Phos pending  Renal: SCr 1.13 (0.99 on 04/07/17)  LFTs: AlkPhos elevated 265, AST/ALT low, Tbili WNL  TGs: check in AM  Prealbumin: check in AM  NUTRITIONAL GOALS                                                                                             RD recs: pending Clinimix / at a goal rate of ___ml/hr + 20% fat emulsion at ___ml/hr to provide: ___g/day protein, ___Kcal/day.  PLAN                                                                                                                          Now: Magnesium sulfate 4g IV x 1  At 1800 today:  Start Clinimix E 5/15 at  88ml/hr.  20% fat emulsion at 38ml/hr over 12hr.  Plan to advance as tolerated to the goal  rate.  TPN to contain standard multivitamins and trace elements.  Reduce IVF to 63ml/hr.  Add SSI moderate scale q4h.   TPN lab panels on Mondays & Thursdays.  F/u daily.   Peggyann Juba, PharmD, BCPS Pager: [phone] 04/16/2017,9:23 AM

## 2017-04-16 NOTE — Procedures (Signed)
Pre procedural Dx: Post-op abscess Post procedural Dx: Same  Technically successful CT guided placed of a 10 Fr drainage catheter placement into the midline of lower abdomen/pelvis yielding 80 cc of dark red fluid.    All aspirated samples sent to the laboratory for analysis.    EBL: None  Complications: None immediate  Ronny Bacon, MD Pager #: [phone]

## 2017-04-16 NOTE — Sedation Documentation (Signed)
Versed 1mg  wasted in sink, winessed by Dr. Pascal Lux.

## 2017-04-16 NOTE — ED Notes (Signed)
Per Dr. Tomi Bamberger, patient to stay in ED at this time until goes to IR.

## 2017-04-16 NOTE — ED Notes (Signed)
Patient informed this writer that after the flagyl IVPB was started she began to itch. Red rash noted on chest and abdomen, which was present when patient arrived. Patient states that she was okay with continuing as long as she could get benedryl. EDP order for 25 mg benedryl IV and to continue antibiotic if patient was agreeable.

## 2017-04-16 NOTE — ED Provider Notes (Signed)
[facility] DEPT Provider Note   CSN: [id number] Arrival date & time: 04/15/17  2259     History   Chief Complaint Chief Complaint  Patient presents with  . Post-op Problem  . Fever    HPI [name] is a 61 y.o. female.  The history is provided by the patient.  She comes in with generalized abdominal pain and no ileostomy output since 10 AM. She had been discharged on September 6 following creation of ileostomy and also placement of ureteral stent. She had been doing well until yesterday when she had some mild abdominal pain and low-grade fever up to 100.4. Today, she had normal output of her ileostomy when the bag was changed at 10 AM, but has had absolutely no output since then. Since then, she has had severe abdominal pain which is crampy in nature and involves her entire abdomen. There's been nausea but no vomiting. She has been taking hydromorphone and ondansetron without relief. Pain is rated at 10/10.  Past Medical History:  Diagnosis Date  . Acute bacterial bronchitis 06/04/2015  . Anemia in neoplastic disease   . Benign essential HTN   . Breast cancer of upper-inner quadrant of left female breast ([place])   . 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 Cancer of corpus uteri, except isthmus [facility]) dx 10-15-2004 oncologist-- dr Denman [name] and dr Alvy Bimler     dx endometrial and ovarian cancer s/p  chemotheapy and surgery:  recurrent 11-19-2014 w/ radiation 01-29-2015 to 03-10-2015  . Cellulitis of left abdominal wall near colostomy 12/03/2014  . Chronic idiopathic neutropenia (HCC)    presumed related to chemotherapy March 2006--- followed by dr Alvy Bimler   . Complication of anesthesia    PONV  . Diabetic retinopathy, background ([place])   . DM type 2 (diabetes mellitus, type 2) ([place])    monitored by  dr Legrand Como altheimer  . Dysuria   . GERD (gastroesophageal reflux disease)    takes omeprazole  . Hiatal hernia   . History of bronchitis   . History of gastric polyp    2014  duodenum  . History of radiation therapy    01-29-2015 to 03-10-2015  pelvis 50.4Gy  . Hypothyroidism    monitored by dr Legrand Como altheimer  . Mixed dyslipidemia   . Multiple thyroid nodules    Managed by Dr. Harlow Asa  . PONV (postoperative nausea and vomiting)    "scopolamine patch works for me"  . Radiation-induced dermatitis    contact dermatitis , radiation completed, rash only on ankles now.  . Right flank pain   . S/P colostomy ([place])   . Seasonal allergies   . Shoulder stiffness    bilateral   . Ureteral stricture, right UROLOGIT-  DR [facility]   CHRONIC--  TREATMENT URETERAL STENT  . Vitamin D deficiency   . Wears glasses     Patient Active Problem List   Diagnosis Date Noted  . Postoperative anemia 04/04/2017  . 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 Hot flashes related to aromatase inhibitor therapy 03/18/2017  . Ureteral stricture, right, s/p resection & bladder hitch reimplantation 04/01/2017 09/10/2016  . Vitamin D insufficiency 08/30/2016  . Genetic testing 11/27/2015  . Cancer of corpus uteri, except isthmus ([place]) 11/13/2015  . Breast cancer  of upper-inner quadrant of left female breast ([place])   . Hydronephrosis, right 03/19/2015  . Pelvic pain in female 03/19/2015  . Chronic anemia 12/30/2014  . Reactive thrombocytosis 11/29/2014  . Pancytopenia, acquired ([place]) 11/26/2014  . Acute respiratory failure with hypoxia ([place]) 11/20/2014  . Morbid obesity ([place]) 11/20/2014  . Endotracheally intubated   . 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 Abdominal pain 09/09/2014  . Postmenopausal bleeding 09/05/2014  . History of ovarian  & endometrial cancer 07/17/2012  . Malignant neoplasm of corpus uteri, except isthmus ([name]) 05/10/2012  . Malignant neoplasm of ovary ([place]) 05/10/2012  . Chronic neutropenia ([place]) 12/14/2011  . Primary hypothyroidism 12/14/2011  . DM type 2 (diabetes mellitus, type 2) ([place]) 12/14/2011  . Benign essential HTN 12/14/2011    Past Surgical History:  Procedure Laterality Date  . APPENDECTOMY    . BREAST RECONSTRUCTION WITH PLACEMENT OF TISSUE EXPANDER AND FLEX HD (ACELLULAR HYDRATED DERMIS) Bilateral 12/11/2015   Procedure: BILATERAL BREAST RECONSTRUCTION WITH PLACEMENT OF TISSUE EXPANDERS;  Surgeon: Irene Limbo, MD;  Location: [facility];  Service: Plastics;  Laterality: Bilateral;  . COLONOSCOPY WITH PROPOFOL N/A 08/21/2013   Procedure: COLONOSCOPY WITH PROPOFOL;  Surgeon: Cleotis Nipper, MD;  Location: [facility];  Service: Endoscopy;  Laterality: N/A;  . COLOSTOMY TAKEDOWN N/A 12/04/2014   Procedure: LAPROSCOPIC LYSIS OF ADHESIONS, SPLENIC MOBILIZATION, RELOCATION OF COLOSTOMY, DEBRIDEMENT INITIAL COLOSTOMY SITE;  Surgeon: Michael [name], MD;  Location: [facility];  Service: General;  Laterality: N/A;  . CYSTOSCOPY W/ RETROGRADES Right 11/21/2015   Procedure: CYSTOSCOPY WITH RETROGRADE PYELOGRAM;  Surgeon: Alexis Frock, MD;  [facility];  Service: Urology;  Laterality: Right;  . CYSTOSCOPY W/ URETERAL STENT PLACEMENT Right 11/21/2015   Procedure: CYSTOSCOPY WITH STENT REPLACEMENT;  Surgeon: Alexis Frock, MD;  [facility];  Service: Urology;  Laterality: Right;  . CYSTOSCOPY W/ URETERAL STENT PLACEMENT Right 03/10/2016   Procedure: CYSTOSCOPY WITH STENT REPLACEMENT;  Surgeon: Alexis Frock, MD;  Location: [facility];  Service: Urology;  Laterality: Right;  . CYSTOSCOPY W/ URETERAL STENT PLACEMENT Right 06/30/2016   Procedure: CYSTOSCOPY WITH RETROGRADE PYELOGRAM/URETERAL STENT EXCHANGE;  Surgeon: Alexis Frock, MD;  Location: [facility];  Service: Urology;   Laterality: Right;  . CYSTOSCOPY WITH RETROGRADE PYELOGRAM, URETEROSCOPY AND STENT PLACEMENT Right 03/20/2015   Procedure: CYSTOSCOPY WITH RETROGRADE PYELOGRAM, URETEROSCOPY WITH BALLOON DILATION AND STENT PLACEMENT ON RIGHT;  Surgeon: Alexis Frock, MD;  Location: [facility];  Service: Urology;  Laterality: Right;  . CYSTOSCOPY WITH RETROGRADE PYELOGRAM, URETEROSCOPY AND STENT PLACEMENT Right 05/02/2015   Procedure: CYSTOSCOPY WITH RIGHT RETROGRADE PYELOGRAM,  DIAGNOSTIC URETEROSCOPY AND STENT PULL ;  Surgeon: Alexis Frock, MD;  Location: [facility];  Service: Urology;  Laterality: Right;  . CYSTOSCOPY WITH RETROGRADE PYELOGRAM, URETEROSCOPY AND STENT PLACEMENT Right 09/05/2015   Procedure: CYSTOSCOPY WITH RETROGRADE PYELOGRAM,  AND STENT PLACEMENT;  Surgeon: Alexis Frock, MD;  [facility];  Service: Urology;  Laterality: Right;  . CYSTOSCOPY WITH RETROGRADE PYELOGRAM, URETEROSCOPY AND STENT PLACEMENT Right 04/01/2017   Procedure: CYSTOSCOPY WITH RETROGRADE PYELOGRAM, URETEROSCOPY AND STENT PLACEMENT;  Surgeon: Alexis Frock, MD;  [facility];  Service: Urology;  Laterality: Right;  . CYSTOSCOPY WITH STENT PLACEMENT Right 10/27/2016   Procedure: CYSTOSCOPY WITH STENT CHANGE and right retrograde pyelogram;  Surgeon: Alexis Frock, MD;  Location: [facility];  Service: Urology;  Laterality: Right;  . EUS N/A 10/02/2014  Procedure: LOWER ENDOSCOPIC ULTRASOUND (EUS);  Surgeon: Arta Silence, MD;  Location: [facility];  Service: Endoscopy;  Laterality: N/A;  . EXCISION SOFT TISSUE MASS RIGHT FOREMAN  12-08-2006  . EYE SURGERY  as child   pytosis of eyelids repair  . INCISION AND DRAINAGE OF WOUND Bilateral 12/26/2015   Procedure: DEBRIDEMENT OF BILATERAL MASTECTOMY FLAPS;  Surgeon: Irene Limbo, MD;  Location: [facility];  Service: Plastics;  Laterality: Bilateral;  . IR FLUORO GUIDE CV LINE RIGHT  04/06/2017  . IR US GUIDE  VASC ACCESS RIGHT  04/06/2017  . LAPAROSCOPIC CHOLECYSTECTOMY  1990  . LIPOSUCTION WITH LIPOFILLING Bilateral 04/16/2016   Procedure: LIPOSUCTION WITH LIPOFILLING TO BILATERAL CHEST;  Surgeon: Irene Limbo, MD;  Location: [facility];  Service: Plastics;  Laterality: Bilateral;  . MASTECTOMY Right 12/11/2015   PROPHYLACTIC   . MASTECTOMY COMPLETE / SIMPLE W/ SENTINEL NODE BIOPSY Left 12/11/2015   AXILLARY SENTINEL LYMPH NODE BIOPSY   . MASTECTOMY W/ SENTINEL NODE BIOPSY Bilateral 12/11/2015   Procedure: RIGHT PROPHYLACTIC MASTECTOMY, LEFT TOTAL MASTECTOMY WITH LEFT AXILLARY SENTINEL LYMPH NODE BIOPSY;  Surgeon: Stark Klein, MD;  Location: [facility];  Service: General;  Laterality: Bilateral;  . OSTOMY N/A 11/19/2014   Procedure: OSTOMY;  Surgeon: Michael [name], MD;  [facility];  Service: General;  Laterality: N/A;  . PROCTOSCOPY N/A 04/01/2017   Procedure: RIDGE PROCTOSCOPY;  Surgeon: Michael [name], MD;  [facility];  Service: General;  Laterality: N/A;  . REMOVAL OF BILATERAL TISSUE EXPANDERS WITH PLACEMENT OF BILATERAL BREAST IMPLANTS Bilateral 04/16/2016   Procedure: REMOVAL OF BILATERAL TISSUE EXPANDERS WITH PLACEMENT OF BILATERAL BREAST IMPLANTS;  Surgeon: Irene Limbo, MD;  Location: [facility];  Service: Plastics;  Laterality: Bilateral;  . ROBOTIC ASSISTED LAP VAGINAL HYSTERECTOMY N/A 11/19/2014   Procedure: ROBOTIC LYSIS OF ADHESIONS, CONVERTED TO LAPAROTOMY RADICAL UPPER VAGINECTOMY,LOW ANTERIOR BOWEL RESECTION, COLOSTOMY, BILATERAL URETERAL STENT PLACEMENT AND CYSTONOMY CLOSURE;  Surgeon: Everitt Amber, MD;  [facility];  Service: Gynecology;  Laterality: N/A;  . TISSUE EXPANDER FILLING Bilateral 12/26/2015   Procedure: EXPANSION OF BILATERAL CHEST TISSUE EXPANDERS (60 mL- Right; 75 mL- Left);  Surgeon: Irene Limbo, MD;  Location: [facility];  Service: Plastics;  Laterality: Bilateral;  . TONSILLECTOMY    . TOTAL ABDOMINAL  HYSTERECTOMY  March 2006   [facility]   and Bilateral Salpingoophorectomy/  staging for Ovarian cancer/  an  . XI ROBOTIC ASSISTED LOWER ANTERIOR RESECTION N/A 04/01/2017   Procedure: XI ROBOTIC VS LAPAROSCOPIC COLOSTOMY TAKEDOWN WITH LYSIS OF ADHESIONS.;  Surgeon: Michael [name], MD;  [facility];  Service: General;  Laterality: N/A;  ERAS PATHWAY    OB History    No data available       Home Medications    Prior to Admission medications   Medication Sig Start Date End Date Taking? Authorizing Provider  anastrozole (ARIMIDEX) 1 MG tablet TAKE 1 TABLET DAILY 12/08/16   Heath Lark, MD  aspirin EC 81 MG tablet Take 81 mg by mouth daily.    [provider]  Biotin 5 MG TABS Take 5 mg by mouth every morning.     [provider]  Calcium-Magnesium-Vitamin D 400-166.7-133.3 MG-MG-UNIT TABS Take 1 tablet by mouth daily.     [provider]  Cholecalciferol (VITAMIN D3) 10000 UNITS capsule Take 10,000 Units by mouth once a week. Sunday evening's    [provider]  diphenhydrAMINE (BENADRYL) 25 MG tablet Take 25 mg by  mouth at bedtime as needed for itching or sleep. Reported on 09/15/2015    [provider]  filgrastim (NEUPOGEN) 480 MCG/1.6ML injection Inject 1.6 ml under the skin every 5 days for life Patient taking differently: Inject 480 mcg into the skin See admin instructions. Inject 1.6 ml under the skin every 6 days for life 01/06/17   Heath Lark, MD  HYDROmorphone (DILAUDID) 2 MG tablet Take 1-2 tablets (2-4 mg total) by mouth every 4 (four) hours as needed for moderate pain or severe pain. 04/07/17   Michael [name], MD  levothyroxine (SYNTHROID, LEVOTHROID) 150 MCG tablet Take 150 mcg by mouth daily.    [provider]  loperamide (IMODIUM) 2 MG capsule Take 1-2 capsules (2-4 mg total) by mouth every 8 (eight) hours as needed for diarrhea or loose stools (Use if >2 BM every 8 hours). 04/07/17   Michael [name], MD  loratadine (CLARITIN) 10 MG  tablet Take 10 mg by mouth daily.     [provider]  losartan (COZAAR) 100 MG tablet Take 1 tablet (100 mg total) by mouth daily. 03/18/17   Heath Lark, MD  metFORMIN (GLUCOPHAGE) 1000 MG tablet Take 1,000 mg by mouth 2 (two) times daily with a meal.     [provider]  omega-3 acid ethyl esters (LOVAZA) 1 G capsule Take 1 g by mouth 2 (two) times daily.    [provider]  Polyethyl Glycol-Propyl Glycol (SYSTANE OP) Place 1 drop into both eyes at bedtime.     [provider]  Prenatal Vit-Fe Fumarate-FA (PRENATAL VITAMIN PO) Take 1 capsule by mouth daily. Takes prenatal because there are no dyes in it    [provider]  rosuvastatin (CRESTOR) 10 MG tablet Take 10 mg by mouth every evening.     [provider]  sitaGLIPtin (JANUVIA) 100 MG tablet Take 100 mg by mouth every morning.     [provider]    Family History Family History  Problem Relation Age of Onset  . Cancer Mother 49       stomach ca  . Hypertension Mother   . Cancer Father 28       prostate ca  . Diabetes Father   . Heart disease Father        CABG  . Breast cancer Maternal Aunt        dx in her 4s  . Lymphoma Paternal Aunt   . Brain cancer Paternal Grandfather   . Ovarian cancer Other   . Diabetes Sister   . Hypertension Brother y-10  . Heart disease Brother        CABG  . Diabetes Brother     Social History Social History  Substance Use Topics  . Smoking status: Never Smoker  . Smokeless tobacco: Never Used  . Alcohol use Yes     Comment: rare social     Allergies   Penicillins; Ultram [tramadol]; Adhesive [tape]; Cefaclor; Erythromycin; Trimethoprim; Ciprofloxacin; Oxycodone; Pectin; and Sulfa antibiotics   Review of Systems Review of Systems  All other systems reviewed and are negative.    Physical Exam Updated Vital Signs BP 122/65 (BP Location: Right Wrist)   Pulse 86   Temp (!) 102.3 F (39.1 C) (Oral)   Resp 18    SpO2 95%   Physical Exam  Nursing note and vitals reviewed.  61 year old female, who appears uncomfortable, but his in no acute distress. Vital signs are significant for fever. Oxygen saturation is 95%,  which is normal. Head is normocephalic and atraumatic. PERRLA, EOMI. Oropharynx is clear. Neck is nontender and supple without adenopathy or JVD. Back is nontender and there is no CVA tenderness. Lungs are clear without rales, wheezes, or rhonchi. Chest is nontender. Heart has regular rate and rhythm without murmur. Abdomen is mildly distended, soft, with ileostomy present in the right lower quadrant. There is diffuse tenderness which is clearly worse on the left side of the abdomen. There is voluntary guarding without rebound tenderness. Bowel sounds are present, and occasionally coming in rushes. Foley catheter is in place. Extremities have no cyanosis or edema, full range of motion is present. Skin is warm and dry without rash. Neurologic: Mental status is normal, cranial nerves are intact, there are no motor or sensory deficits.  ED Treatments / Results  Labs (all labs ordered are listed, but only abnormal results are displayed) Labs Reviewed  COMPREHENSIVE METABOLIC PANEL - Abnormal; Notable for the following:       Result Value   Glucose, Bld 162 (*)    Creatinine, Ser 1.13 (*)    Calcium 8.2 (*)    Total Protein 6.3 (*)    Albumin 2.0 (*)    AST 14 (*)    ALT 13 (*)    Alkaline Phosphatase 265 (*)    GFR calc non Af Amer 51 (*)    GFR calc Af Amer 59 (*)    All other components within normal limits  CBC WITH DIFFERENTIAL/PLATELET - Abnormal; Notable for the following:    WBC 13.4 (*)    RBC 2.71 (*)    Hemoglobin 7.8 (*)    HCT 23.7 (*)    RDW 15.8 (*)    Platelets 547 (*)    Neutro Abs 12.2 (*)    Lymphs Abs 0.5 (*)    All other components within normal limits  URINALYSIS, ROUTINE W REFLEX MICROSCOPIC - Abnormal; Notable for the following:    Color, Urine RED (*)      APPearance TURBID (*)    Hgb urine dipstick LARGE (*)    Protein, ur 100 (*)    Leukocytes, UA MODERATE (*)    Bacteria, UA MANY (*)    Squamous Epithelial / LPF 0-5 (*)    All other components within normal limits  CULTURE, BLOOD (ROUTINE X 2)  CULTURE, BLOOD (ROUTINE X 2)  PROTIME-INR  I-STAT CG4 LACTIC ACID, ED  I-STAT CG4 LACTIC ACID, ED    EKG  EKG Interpretation None       Radiology Dg Chest 2 View  Result Date: 04/16/2017 CLINICAL DATA:  Fever EXAM: CHEST  2 VIEW COMPARISON:  April 01, 2017 FINDINGS: Central catheter tip is in the superior vena cava. No pneumothorax. There is no edema or consolidation. The heart size and pulmonary vascularity are normal. No adenopathy. No bone lesions. IMPRESSION: No edema or consolidation. Central catheter tip in superior vena cava. No pneumothorax. Electronically Signed   By: Lowella Grip III M.D.   On: 04/16/2017 00:35   Ct Abdomen Pelvis W Contrast  Result Date: 04/16/2017 CLINICAL DATA:  Abdominal pain. Decreased ostomy output. Post low anterior resection 2 weeks prior. EXAM: CT ABDOMEN AND PELVIS WITH CONTRAST TECHNIQUE: Multidetector CT imaging of the abdomen and pelvis was performed using the standard protocol following bolus administration of intravenous contrast. CONTRAST:  130m ISOVUE-300 IOPAMIDOL (ISOVUE-300) INJECTION 61% COMPARISON:  CT 01/27/2017 FINDINGS: Lower chest: Bilateral lower lobe airspace opacities with air bronchograms, right greater than left. Trace bilateral  pleural effusions. Hepatobiliary: No focal hepatic lesion. Postcholecystectomy with similar biliary prominence prior exam, common bile duct measures 15 mm. Normal tapering to the duodenal insertion. Pancreas: No ductal dilatation or inflammation. Spleen: Normal in size without focal abnormality. Adrenals/Urinary Tract: Normal right adrenal gland. Unchanged left adrenal adenoma Right ureteral stent in place. Decreased hydronephrosis from prior exam. There  is increased left hydronephrosis from prior. Left ureter is dilated to the level of pelvic brim. Urinary bladder is decompressed by Foley catheter. Air in the bladder and right renal collecting system consistent with recent procedure. Stomach/Bowel: Lack of enteric contrast limits detailed assessment. 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 Stomach is physiologically distended. There are fluid-filled prominent small bowel loops in the left abdomen with associated mesenteric edema. Multiple small foci of air in the left small bowel mesentery in the region of inflammatory change. Stapled off sigmoid colon in the pelvis. There is an adjacent irregular fluid collection in the presacral space, ill-defined margins. Measurements difficult to define due to irregular nature but measures approximately 5 cm. Additional pelvic fluid collection anterior to the urinary bladder containing air measures 8.1 x 7.3 cm. Vascular/Lymphatic: Normal caliber abdominal aorta. Multiple small retroperitoneal lymph nodes. No evidence pelvic adenopathy. Reproductive: Status post hysterectomy. No adnexal masses. Other: Presacral air fluid collection, ill-defined. Pelvic air fluid collection measures 8.1 x 7.3 cm anterior to the urinary bladder. Postsurgical change in the anterior abdominal wall with multiple foci of subcutaneous air. Ill-defined fluid in the anterior upper abdominal wall without confluent well-defined abscess. Musculoskeletal: There are no acute or suspicious osseous abnormalities. IMPRESSION: 1. Post low anterior resection 2 weeks prior with pelvic abscess anterior to the bladder measuring 8.1 x 7.3 cm. Additionally ill-defined air fluid collection adjacent to the stapled off sigmoid colon in the presacral space. 2. Edematous small bowel loops in the left abdomen with mesenteric edema and multiple foci of mesenteric air. Extraluminal air should have resolved weeks postsurgical, raising concern for ischemia or  bowel perforation. 3. Mild increase in left hydroureteronephrosis, with transition at the pelvic brim. Decreased right hydronephrosis with right ureteral stent in place. 4. Scattered air in the subcutaneous tissues of the anterior abdominal wall, which may be postoperative, however unusual duration. These results were called by telephone at the time of interpretation on 04/16/2017 at 3:32 am to Dr. Delora Fuel , who verbally acknowledged these results. Electronically Signed   By: Jeb Levering M.D.   On: 04/16/2017 03:33    Procedures Procedures (including critical care time)  Medications Ordered in ED Medications  HYDROmorphone (DILAUDID) injection 1 mg (1 mg Intravenous Given 04/16/17 0213)  iopamidol (ISOVUE-300) 61 % injection (not administered)  sodium chloride 0.9 % bolus 1,000 mL (not administered)  metroNIDAZOLE (FLAGYL) IVPB 500 mg (not administered)  vancomycin (VANCOCIN) IVPB 1000 mg/200 mL premix (not administered)  aztreonam (AZACTAM) 2 g in dextrose 5 % 50 mL IVPB (not administered)  HYDROmorphone (DILAUDID) injection 1 mg (not administered)  vancomycin (VANCOCIN) IVPB 750 mg/150 ml premix (not administered)  ondansetron (ZOFRAN) injection 4 mg (4 mg Intravenous Given 04/16/17 0056)  sodium chloride 0.9 % bolus 1,000 mL (0 mLs Intravenous Stopped 04/16/17 0215)  iopamidol (ISOVUE-300) 61 % injection 100 mL (100 mLs Intravenous Contrast Given 04/16/17 0244)     Initial Impression / Assessment and Plan / ED Course  I have reviewed the triage vital signs and the nursing notes.  Pertinent labs & imaging results that were available during my care of the patient were reviewed  by me and considered in my medical decision making (see chart for details).  Fever, abdominal pain, no output of ileostomy. This is very worrisome for small bowel obstruction. Old records reviewed confirming recent surgery as described above. Underlying pathology is recurrent endometrial cancer. She is given IV  fluids, hydromorphone, ondansetron and will be sent for CT of abdomen and pelvis.  She had good relief of pain with above noted treatment, but pain did recur requiring additional hydromorphone. Laboratory workup shows stable anemia, mild leukocytosis, normal lactic acid level. CT shows evidence of intra-abdominal abscess including some air in the mesentery. With the aid of pharmacy, antibiotics are ordered. In about collection is difficult because of penicillin and Plaquenil alone and cephalosporin allergies. She is given aztreonam, vancomycin, metronidazole. Case is discussed with Dr. Excell Seltzer, on call for Dr. gross, who agrees to admit the patient. He will come to the ED to evaluate her. He has requested NG tube insertion.  Final Clinical Impressions(s) / ED Diagnoses   Final diagnoses:  Intra-abdominal abscess post-procedure, initial encounter ([place])  Normochromic normocytic anemia  Thrombocytosis (HCC)    New Prescriptions New Prescriptions   No medications on file     Delora Fuel, MD 59/53/96 [phone]

## 2017-04-16 NOTE — Progress Notes (Signed)
Pt confused as to whether or not she should take Flagyl d/t rash from Diflucan. Paged Dr. Excell Seltzer for further clarification. Received call from Dr. Excell Seltzer. VO to proceed with Flagyl and monitor pt and notify on call MD if rash worsens or pt has reaction to medication.   Pt agreeable to plan above. Rash marked and will monitor pt.

## 2017-04-16 NOTE — ED Notes (Signed)
Patient being transferred to IR, will go up to floor after. RN aware.

## 2017-04-16 NOTE — ED Notes (Signed)
Report given to Sheryle Spray RN

## 2017-04-16 NOTE — ED Notes (Signed)
First set of blood cultures obtained at 0043 and second set obtained at [place]

## 2017-04-16 NOTE — ED Notes (Signed)
Bed: [facility] Expected date:  Expected time:  Means of arrival:  Comments: 

## 2017-04-16 NOTE — ED Notes (Signed)
Attempted to call report to [facility]W. Junie Panning, RN to call back.

## 2017-04-16 NOTE — Progress Notes (Signed)
MEDICATION-RELATED CONSULT NOTE   IR Procedure Consult - Anticoagulant/Antiplatelet PTA/Inpatient Med List Review by Pharmacist    Procedure: CT guided drainage catheter placement in lower abdomen/pelvis    Completed: 04/16/17 at 12:14  Post-Procedural bleeding risk per IR MD assessment:  Standard  Antithrombotic medications on inpatient or PTA profile prior to procedure:   Lovenox 40mg  SQ q24h    Recommended restart time per IR Post-Procedure Guidelines:  Day + 1   Other considerations:      Plan:    Start Lovenox 40mg  SQ q24h tomorrow 04/17/17 at 10:00

## 2017-04-16 NOTE — ED Notes (Signed)
Attempted to call report again to 5W. Unavailable at this time.

## 2017-04-17 ENCOUNTER — Inpatient Hospital Stay (HOSPITAL_COMMUNITY): Payer: BLUE CROSS/BLUE SHIELD

## 2017-04-17 LAB — GLUCOSE, CAPILLARY
GLUCOSE-CAPILLARY: 163 mg/dL — AB (ref 65–99)
GLUCOSE-CAPILLARY: 134 mg/dL — AB (ref 65–99)
Glucose-Capillary: 131 mg/dL — ABNORMAL HIGH (ref 65–99)
Glucose-Capillary: 135 mg/dL — ABNORMAL HIGH (ref 65–99)
Glucose-Capillary: 157 mg/dL — ABNORMAL HIGH (ref 65–99)
Glucose-Capillary: 159 mg/dL — ABNORMAL HIGH (ref 65–99)

## 2017-04-17 LAB — DIFFERENTIAL
Basophils Absolute: 0 10*3/uL (ref 0.0–0.1)
Basophils Relative: 0 %
EOS ABS: 0.2 10*3/uL (ref 0.0–0.7)
EOS PCT: 2 %
LYMPHS ABS: 0.7 10*3/uL (ref 0.7–4.0)
Lymphocytes Relative: 6 %
MONO ABS: 0.8 10*3/uL (ref 0.1–1.0)
Monocytes Relative: 7 %
NEUTROS PCT: 85 %
Neutro Abs: 10 10*3/uL — ABNORMAL HIGH (ref 1.7–7.7)

## 2017-04-17 LAB — COMPREHENSIVE METABOLIC PANEL
ALT: 12 U/L — AB (ref 14–54)
AST: 14 U/L — AB (ref 15–41)
Albumin: 1.7 g/dL — ABNORMAL LOW (ref 3.5–5.0)
Alkaline Phosphatase: 202 U/L — ABNORMAL HIGH (ref 38–126)
Anion gap: 7 (ref 5–15)
BUN: 10 mg/dL (ref 6–20)
CHLORIDE: 104 mmol/L (ref 101–111)
CO2: 25 mmol/L (ref 22–32)
CREATININE: 0.9 mg/dL (ref 0.44–1.00)
Calcium: 7.8 mg/dL — ABNORMAL LOW (ref 8.9–10.3)
GFR calc Af Amer: >60 mL/min (ref >60)
GFR calc non Af Amer: >60 mL/min (ref >60)
Glucose, Bld: 169 mg/dL — ABNORMAL HIGH (ref 65–99)
Potassium: 3.6 mmol/L (ref 3.5–5.1)
SODIUM: 136 mmol/L (ref 135–145)
Total Bilirubin: 0.3 mg/dL (ref 0.3–1.2)
Total Protein: 5.5 g/dL — ABNORMAL LOW (ref 6.5–8.1)

## 2017-04-17 LAB — TRIGLYCERIDES: Triglycerides: 145 mg/dL (ref <150)

## 2017-04-17 LAB — MAGNESIUM: Magnesium: 1.9 mg/dL (ref 1.7–2.4)

## 2017-04-17 LAB — PHOSPHORUS: Phosphorus: 3.2 mg/dL (ref 2.5–4.6)

## 2017-04-17 LAB — HIV ANTIBODY (ROUTINE TESTING W REFLEX): HIV Screen 4th Generation wRfx: NONREACTIVE

## 2017-04-17 LAB — CBC
HCT: 19.9 % — ABNORMAL LOW (ref 36.0–46.0)
Hemoglobin: 6.5 g/dL — CL (ref 12.0–15.0)
MCH: 28.6 pg (ref 26.0–34.0)
MCHC: 32.7 g/dL (ref 30.0–36.0)
MCV: 87.7 fL (ref 78.0–100.0)
PLATELETS: 436 10*3/uL — AB (ref 150–400)
RBC: 2.27 MIL/uL — AB (ref 3.87–5.11)
RDW: 15.8 % — ABNORMAL HIGH (ref 11.5–15.5)
WBC: 11.8 10*3/uL — AB (ref 4.0–10.5)

## 2017-04-17 LAB — PREALBUMIN: Prealbumin: 8.5 mg/dL — ABNORMAL LOW (ref 18–38)

## 2017-04-17 LAB — PREPARE RBC (CROSSMATCH)

## 2017-04-17 MED ORDER — FAT EMULSION 20 % IV EMUL
240.0000 mL | INTRAVENOUS | Status: AC
  Administered 2017-04-17: 240 mL via INTRAVENOUS
  Filled 2017-04-17: qty 250

## 2017-04-17 MED ORDER — SODIUM CHLORIDE 0.9 % IV SOLN
Freq: Once | INTRAVENOUS | Status: AC
  Administered 2017-04-17: 09:00:00 via INTRAVENOUS

## 2017-04-17 MED ORDER — TRACE MINERALS CR-CU-MN-SE-ZN 10-1000-500-60 MCG/ML IV SOLN
INTRAVENOUS | Status: AC
  Administered 2017-04-17: 18:00:00 via INTRAVENOUS
  Filled 2017-04-17: qty 1200

## 2017-04-17 NOTE — Progress Notes (Signed)
Patient ID: [name], female   DOB: Dec 17, 1955, 61 y.o.   MRN: [id number]     Subjective: Feels a little better today. NG tube is bothering her. Nothing out of her ostomy. Abdominal pain is a little bit better. She tolerated drainage of pelvic fluid collection well. This showed old blood, probable hematoma.  Objective: Vital signs in last 24 hours: Temp:  [98.6 F (37 C)-99.8 F (37.7 C)] 99.1 F (37.3 C) (09/16 0415) Pulse Rate:  [76-84] 76 (09/16 0415) Resp:  [14-21] 18 (09/16 0415) BP: (105-139)/(42-120) 126/57 (09/16 0415) SpO2:  [94 %-98 %] 94 % (09/16 0415) Weight:  [96.3 kg (212 lb 3.2 oz)] 96.3 kg (212 lb 3.2 oz) (09/15 2102)    Intake/Output from previous day: 09/15 0701 - 09/16 0700 In: 3521 [I.V.:1909; IV Piggyback:1600] Out: 2135 [Urine:1800; Emesis/NG output:220; Drains:115] Intake/Output this shift: No intake/output data recorded.  General appearance: alert, cooperative and fatigued Resp: clear to auscultation bilaterally GI: Minimal distention. Mild to moderate left upper quadrant tenderness with most of it around her incision. No peritoneal signs. Drain with dark old bloody drainage Skin:  Rash over upper anterior chest unchanged or slightly improved  Lab Results:   Recent Labs  04/16/17 0047 04/17/17 0529  WBC 13.4* 11.8*  HGB 7.8* 6.5*  HCT 23.7* 19.9*  PLT 547* 436*   BMET  Recent Labs  04/16/17 0047 04/17/17 0529  NA 135 136  K 4.1 3.6  CL 102 104  CO2 22 25  GLUCOSE 162* 169*  BUN 13 10  CREATININE 1.13* 0.90  CALCIUM 8.2* 7.8*     Studies/Results: Dg Chest 2 View  Result Date: 04/16/2017 CLINICAL DATA:  Fever EXAM: CHEST  2 VIEW COMPARISON:  April 01, 2017 FINDINGS: Central catheter tip is in the superior vena cava. No pneumothorax. There is no edema or consolidation. The heart size and pulmonary vascularity are normal. No adenopathy. No bone lesions. IMPRESSION: No edema or consolidation. Central catheter tip in superior vena  cava. No pneumothorax. Electronically Signed   By: Lowella Grip III M.D.   On: 04/16/2017 00:35   Dg Abdomen 1 View  Result Date: 04/16/2017 CLINICAL DATA:  NGT placement. EXAM: ABDOMEN - 1 VIEW COMPARISON:  CT earlier this day. FINDINGS: Tip and side port of the enteric tube below the diaphragm in the stomach. Prominent small bowel in the left upper quadrant again seen. Right mid abdominal ostomy. Excreted intravenous contrast in the left renal collecting system and ureter which appear dilated. Right ureteral stent in place. Surgical clips in the pelvis. IMPRESSION: Tip and side port of the enteric tube below the diaphragm in the stomach. Electronically Signed   By: Jeb Levering M.D.   On: 04/16/2017 05:39   Ct Abdomen Pelvis W Contrast  Result Date: 04/16/2017 CLINICAL DATA:  Abdominal pain. Decreased ostomy output. Post low anterior resection 2 weeks prior. EXAM: CT ABDOMEN AND PELVIS WITH CONTRAST TECHNIQUE: Multidetector CT imaging of the abdomen and pelvis was performed using the standard protocol following bolus administration of intravenous contrast. CONTRAST:  133mL ISOVUE-300 IOPAMIDOL (ISOVUE-300) INJECTION 61% COMPARISON:  CT 01/27/2017 FINDINGS: Lower chest: Bilateral lower lobe airspace opacities with air bronchograms, right greater than left. Trace bilateral pleural effusions. Hepatobiliary: No focal hepatic lesion. Postcholecystectomy with similar biliary prominence prior exam, common bile duct measures 15 mm. Normal tapering to the duodenal insertion. Pancreas: No ductal dilatation or inflammation. Spleen: Normal in size without focal abnormality. Adrenals/Urinary Tract: Normal right adrenal gland. Unchanged left adrenal adenoma  Right ureteral stent in place. Decreased hydronephrosis from prior exam. There is increased left hydronephrosis from prior. Left ureter is dilated to the level of pelvic brim. Urinary bladder is decompressed by Foley catheter. Air in the bladder and right  renal collecting system consistent with recent procedure. Stomach/Bowel: Lack of enteric contrast limits detailed assessment. 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 Stomach is physiologically distended. There are fluid-filled prominent small bowel loops in the left abdomen with associated mesenteric edema. Multiple small foci of air in the left small bowel mesentery in the region of inflammatory change. Stapled off sigmoid colon in the pelvis. There is an adjacent irregular fluid collection in the presacral space, ill-defined margins. Measurements difficult to define due to irregular nature but measures approximately 5 cm. Additional pelvic fluid collection anterior to the urinary bladder containing air measures 8.1 x 7.3 cm. Vascular/Lymphatic: Normal caliber abdominal aorta. Multiple small retroperitoneal lymph nodes. No evidence pelvic adenopathy. Reproductive: Status post hysterectomy. No adnexal masses. Other: Presacral air fluid collection, ill-defined. Pelvic air fluid collection measures 8.1 x 7.3 cm anterior to the urinary bladder. Postsurgical change in the anterior abdominal wall with multiple foci of subcutaneous air. Ill-defined fluid in the anterior upper abdominal wall without confluent well-defined abscess. Musculoskeletal: There are no acute or suspicious osseous abnormalities. IMPRESSION: 1. Post low anterior resection 2 weeks prior with pelvic abscess anterior to the bladder measuring 8.1 x 7.3 cm. Additionally ill-defined air fluid collection adjacent to the stapled off sigmoid colon in the presacral space. 2. Edematous small bowel loops in the left abdomen with mesenteric edema and multiple foci of mesenteric air. Extraluminal air should have resolved weeks postsurgical, raising concern for ischemia or bowel perforation. 3. Mild increase in left hydroureteronephrosis, with transition at the pelvic brim. Decreased right hydronephrosis with right ureteral stent in place. 4.  Scattered air in the subcutaneous tissues of the anterior abdominal wall, which may be postoperative, however unusual duration. These results were called by telephone at the time of interpretation on 04/16/2017 at 3:32 am to Dr. Delora Fuel , who verbally acknowledged these results. Electronically Signed   By: Jeb Levering M.D.   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 Patient underwent a complicated LAR procedure approximately 2 weeks ago by Dr. Johney Maine, however returned to the emergency department last evening with fever and decreased output from her ileostomy. CT demonstrated an indeterminate fluid collection within the midline of the lower abdomen / pelvis and as such, request made for placement of a CT-guided drainage catheter placement for infection source control purposes. EXAM: CT IMAGE GUIDED DRAINAGE BY PERCUTANEOUS CATHETER COMPARISON:  CT abdomen pelvis - 04/17/2019 MEDICATIONS: The patient is currently admitted to the hospital and receiving intravenous antibiotics. The antibiotics were administered within an appropriate time frame prior to the initiation of the procedure. ANESTHESIA/SEDATION: Moderate (conscious) sedation was employed during this procedure. A total of Versed 3 mg and Fentanyl 100 mcg was administered intravenously. Moderate Sedation Time: 17 minutes. The patient's level of consciousness and vital signs were monitored continuously by radiology nursing throughout the procedure under my direct supervision. CONTRAST:  None COMPLICATIONS: None immediate. PROCEDURE: Informed written consent was obtained from the patient after a discussion of the risks, benefits  and alternatives to treatment. The patient was placed supine on the CT gantry and a pre procedural CT was performed re-demonstrating the known abscess/fluid collection within the midline of the lower abdomen and pelvis with dominant component measuring approximately 7.4 x 6.0 cm (image 21, series 2). The procedure was planned. A timeout was performed prior to the initiation of the procedure. The skin overlying the ventral aspect the right lower abdomen/pelvis was prepped and draped in the usual sterile fashion. The overlying soft tissues were anesthetized with 1% lidocaine with epinephrine. Appropriate trajectory was planned with the use of a 22 gauge spinal needle. An 18 gauge trocar needle was advanced into the abscess/fluid collection and a short Amplatz super stiff wire was coiled within the collection. Appropriate positioning was confirmed with a limited CT scan. The tract was serially dilated allowing placement of a 10 [place] all-purpose drainage catheter. Appropriate positioning was confirmed with a limited postprocedural CT scan. Approximately 80 cc of dark red fluid fluid was aspirated. The tube was connected to a JP bulb and sutured in place. A dressing was placed. The patient tolerated the procedure well without immediate post procedural complication. IMPRESSION: Successful CT guided placement of a 10 French all purpose drain catheter into the fluid collection within the midline of the lower abdomen/pelvis with aspiration of 80 cc of dark red fluid. Samples were sent to the laboratory as requested by the ordering clinical team. Electronically Signed   By: Sandi Mariscal M.D.   On: 04/16/2017 12:58    Anti-infectives: Anti-infectives    Start     Dose/Rate Route Frequency Ordered Stop   04/16/17 2200  vancomycin (VANCOCIN) IVPB 1000 mg/200 mL premix  Status:  Discontinued     1,000 mg 200 mL/hr over 60 Minutes Intravenous Every 12 hours 04/16/17 0917 04/16/17 1154   04/16/17 2200  vancomycin  (VANCOCIN) IVPB 750 mg/150 ml premix     750 mg 150 mL/hr over 60 Minutes Intravenous Every 12 hours 04/16/17 1154     04/16/17 1800  vancomycin (VANCOCIN) IVPB 750 mg/150 ml premix  Status:  Discontinued     750 mg 150 mL/hr over 60 Minutes Intravenous Every 12 hours 04/16/17 0359 04/16/17 0652   04/16/17 1245  metroNIDAZOLE (FLAGYL) IVPB 500 mg  Status:  Discontinued     500 mg 100 mL/hr over 60 Minutes Intravenous Every 8 hours 04/16/17 1244 04/16/17 1258   04/16/17 0930  vancomycin (VANCOCIN) IVPB 1000 mg/200 mL premix     1,000 mg 200 mL/hr over 60 Minutes Intravenous  Once 04/16/17 0911 04/16/17 1043   04/16/17 0600  aztreonam (AZACTAM) injection 2 g  Status:  Discontinued     2 g Intramuscular Every 8 hours 04/16/17 0354 04/16/17 0355   04/16/17 0400  metroNIDAZOLE (FLAGYL) IVPB 500 mg     500 mg 100 mL/hr over 60 Minutes Intravenous Every 8 hours 04/16/17 0354     04/16/17 0400  vancomycin (VANCOCIN) IVPB 1000 mg/200 mL premix  Status:  Discontinued     1,000 mg 200 mL/hr over 60 Minutes Intravenous  Once 04/16/17 0354 04/16/17 0911   04/16/17 0400  aztreonam (AZACTAM) 2 g in dextrose 5 % 50 mL IVPB     2 g 100 mL/hr over 30 Minutes Intravenous Every 8 hours 04/16/17 0356     04/16/17 0345  gentamicin (GARAMYCIN) 240 mg in dextrose 5 % 50 mL IVPB  Status:  Discontinued     2.5 mg/kg  96.3 kg 112 mL/hr over 30  Minutes Intravenous Every 12 hours 04/16/17 0342 04/16/17 0354      Assessment/Plan: Just over 2 weeks status post robotic takedown of colostomy, ileocecectomy and diverting ileostomy Presents with fever and abdominal pain, small dots of air in the mesentery of small bowel left upper quadrant and 2 pelvic fluid collections. Status post drainage yesterday of anterior pelvic fluid collection showing old blood. Overall seems slightly improved with improved leukocytosis and less pain. Temperature curve down. Source of small bowel mesenteric air not clear. No apparent free  perforation. Chronic anemia and leukopenia. Hemoglobin down. Will transfuse 2 units RBCs. I do not see indication for emergency surgery at this point which would be very difficult/dangerous Continue IV antibiotics and close observation. Needs repeat CT scan with oral contrast within the next few days. TNA started.    LOS: 1 day    Jaspal Pultz T 04/17/2017

## 2017-04-17 NOTE — Progress Notes (Addendum)
Low diastolic BP.  Dr. Barry Dienes Paged for further clarification.

## 2017-04-17 NOTE — Progress Notes (Signed)
Initial Nutrition Assessment  DOCUMENTATION CODES:   Obesity unspecified  INTERVENTION:   -TPN per Pharmacy -Recommend daily weights while patient is receiving TPN. -When diet advanced, provide Premier Protein BID, each supplement provides 160kcal and 30g protein.   -RD will continue to monitor  NUTRITION DIAGNOSIS:   Inadequate oral intake related to inability to eat as evidenced by NPO status.  GOAL:   Patient will meet greater than or equal to 90% of their needs  MONITOR:   Diet advancement, Labs, Weight trends, I & O's (TPN)  REASON FOR ASSESSMENT:   Consult New TPN/TNA  ASSESSMENT:    61 y.o. female with a history of endometrial/ovarian cancer diagnosed in 2006, status post TAH/BSO and chemotherapy.   Patient in room with husband at bedside. Pt reports eating fine until Friday 9/14 after she had a device change in her ileostomy. Pt was seen by this RD for education during previous admission when ileostomy was placed. Pt states she continued to follow diet and drink 2 Premier Proteins a day. Will order Premier once diet is advanced. Pt with NGT placed, output: 220 ml documented, > 600 ml noted in room.  Weight has increased. Recommend daily weights while patient on TPN. Nutrition focused physical exam shows no sign of depletion of muscle mass or body fat.  Medications: IV Protonix daily, IV Zofran PRN Labs reviewed: CBGs: 131-159 Mg/Phos WNL  Diet Order:  Diet NPO time specified TPN (CLINIMIX-E) Adult .TPN (CLINIMIX-E) Adult  Skin:  Reviewed, no issues  Last BM:  9/6  Height:   Ht Readings from Last 1 Encounters:  04/16/17 5' 2.5" (1.588 m)    Weight:   Wt Readings from Last 1 Encounters:  04/16/17 212 lb 3.2 oz (96.3 kg)    Ideal Body Weight:  52.3 kg  BMI:  Body mass index is 38.19 kg/m.  Estimated Nutritional Needs:   Kcal:  1600-1800  Protein:  65-75g  Fluid:  1.8L/day  EDUCATION NEEDS:   Education needs addressed  Clayton Bibles, MS, RD, LDN Pager: [phone] After Hours Pager: [phone]

## 2017-04-17 NOTE — Progress Notes (Signed)
[place] NOTE   Pharmacy Consult for TPN Indication: Bowel obstruction  Patient Measurements: Height: 5' 2.5" (158.8 cm) Weight: 212 lb 3.2 oz (96.3 kg) IBW/kg (Calculated) : 51.25Weight (04/07/17): 96.3kg  Height (04/01/17): 62.5 inches TPN AdjBW (KG): 62.5 Body mass index is 38.19 kg/m. Usual Weight:   Insulin Requirements: none  Current Nutrition: NPO  IVF: LR with 84meq KCl at 161ml/hr  Central access: Double lumen PICC placed 04/06/17 TPN start date: 04/16/17  ASSESSMENT                                                                                                          HPI: 61 yo female with endometrial/ovarian cancer diagnosed in 2006, status post TAH/BSO and chemotherapy, recurrence 2016 underwent resection with colectomy & colostomy and radiation to the pelvis. On 8/31 underwent laparoscopic takedown of her colostomy with anastomosis, LOA, iliocecal ectomy and diverting ileostomy by Dr. Johney Maine. Also underwent reimplantation of her right ureter by Dr. Tresa Moore.  Now presents with fever and partial obstruction or ileus. Surgery recommended bowel rest with NG suction, TNA and broad spectrum antibiotics. Consulting IR regarding drainage of her pelvic fluid collections.  Significant events:   Today:    Glucose: Hx DM on metformin and sitagliptin PTA; CBG elevated 169 with start of TPN  Electrolytes: K 3.6, corrected Ca 9.64, Mg/Phos WNL  Renal: SCr improved to 0.9  LFTs: AlkPhos elevated 202, AST/ALT low, Tbili WNL  TGs: pending  Prealbumin: pending  NUTRITIONAL GOALS                                                                                             RD recs: pending Clinimix / at a goal rate of ___ml/hr + 20% fat emulsion at ___ml/hr to provide: ___g/day protein, ___Kcal/day.  PLAN                                                                                                                          At 1800  today:  Increase Clinimix E 5/15 to 25ml/hr.  20% fat emulsion at 82ml/hr over 12hr.  Plan to advance as tolerated to the  goal rate.  TPN to contain standard multivitamins and trace elements.  Reduce IVF to 41ml/hr.  SSI moderate scale q4h.   TPN lab panels on Mondays & Thursdays.  F/u daily.   Peggyann Juba, PharmD, BCPS Pager: [phone] 04/17/2017,7:22 AM

## 2017-04-17 NOTE — Progress Notes (Signed)
CRITICAL VALUE ALERT  Critical Value:  Hgb 6.5  Date & Time Notied:  04/17/17 @ 4709  Provider Notified: Dr. Barry Dienes  Orders Received/Actions taken: Hgb. Critical value, MD paged.

## 2017-04-18 ENCOUNTER — Encounter (HOSPITAL_COMMUNITY): Payer: Self-pay | Admitting: Surgery

## 2017-04-18 DIAGNOSIS — E440 Moderate protein-calorie malnutrition: Secondary | ICD-10-CM

## 2017-04-18 DIAGNOSIS — K567 Ileus, unspecified: Secondary | ICD-10-CM

## 2017-04-18 LAB — COMPREHENSIVE METABOLIC PANEL
ALBUMIN: 1.8 g/dL — AB (ref 3.5–5.0)
ALT: 11 U/L — ABNORMAL LOW (ref 14–54)
ANION GAP: 10 (ref 5–15)
AST: 13 U/L — AB (ref 15–41)
Alkaline Phosphatase: 176 U/L — ABNORMAL HIGH (ref 38–126)
BILIRUBIN TOTAL: 0.4 mg/dL (ref 0.3–1.2)
BUN: 13 mg/dL (ref 6–20)
CHLORIDE: 103 mmol/L (ref 101–111)
CO2: 24 mmol/L (ref 22–32)
Calcium: 8 mg/dL — ABNORMAL LOW (ref 8.9–10.3)
Creatinine, Ser: 0.79 mg/dL (ref 0.44–1.00)
GFR calc Af Amer: >60 mL/min (ref >60)
GFR calc non Af Amer: >60 mL/min (ref >60)
GLUCOSE: 153 mg/dL — AB (ref 65–99)
Potassium: 3.7 mmol/L (ref 3.5–5.1)
Sodium: 137 mmol/L (ref 135–145)
TOTAL PROTEIN: 6 g/dL — AB (ref 6.5–8.1)

## 2017-04-18 LAB — GLUCOSE, CAPILLARY
GLUCOSE-CAPILLARY: 143 mg/dL — AB (ref 65–99)
GLUCOSE-CAPILLARY: 152 mg/dL — AB (ref 65–99)
GLUCOSE-CAPILLARY: 155 mg/dL — AB (ref 65–99)
GLUCOSE-CAPILLARY: 166 mg/dL — AB (ref 65–99)
Glucose-Capillary: 141 mg/dL — ABNORMAL HIGH (ref 65–99)
Glucose-Capillary: 148 mg/dL — ABNORMAL HIGH (ref 65–99)
Glucose-Capillary: 117 mg/dL — ABNORMAL HIGH (ref 65–99)

## 2017-04-18 LAB — DIFFERENTIAL
Basophils Absolute: 0 10*3/uL (ref 0.0–0.1)
Basophils Relative: 0 %
EOS ABS: 0.3 10*3/uL (ref 0.0–0.7)
Eosinophils Relative: 3 %
LYMPHS PCT: 7 %
Lymphs Abs: 0.7 10*3/uL (ref 0.7–4.0)
Monocytes Absolute: 0.7 10*3/uL (ref 0.1–1.0)
Monocytes Relative: 7 %
NEUTROS PCT: 83 %
Neutro Abs: 8.6 10*3/uL — ABNORMAL HIGH (ref 1.7–7.7)

## 2017-04-18 LAB — BPAM RBC
BLOOD PRODUCT EXPIRATION DATE: 201809252359
Blood Product Expiration Date: 201810032359
ISSUE DATE / TIME: 201809161308
ISSUE DATE / TIME: 201809161644
UNIT TYPE AND RH: 6200
Unit Type and Rh: 6200

## 2017-04-18 LAB — CREATININE, FLUID (PLEURAL, PERITONEAL, JP DRAINAGE): CREAT FL: 12.1 mg/dL

## 2017-04-18 LAB — CBC
HCT: 24.4 % — ABNORMAL LOW (ref 36.0–46.0)
HEMOGLOBIN: 7.9 g/dL — AB (ref 12.0–15.0)
MCH: 27.1 pg (ref 26.0–34.0)
MCHC: 32.4 g/dL (ref 30.0–36.0)
MCV: 83.6 fL (ref 78.0–100.0)
PLATELETS: 402 10*3/uL — AB (ref 150–400)
RBC: 2.92 MIL/uL — AB (ref 3.87–5.11)
RDW: 18.6 % — ABNORMAL HIGH (ref 11.5–15.5)
WBC: 10.3 10*3/uL (ref 4.0–10.5)

## 2017-04-18 LAB — TYPE AND SCREEN
ABO/RH(D): A POS
Antibody Screen: NEGATIVE
UNIT DIVISION: 0
UNIT DIVISION: 0

## 2017-04-18 LAB — MAGNESIUM: MAGNESIUM: 1.6 mg/dL — AB (ref 1.7–2.4)

## 2017-04-18 LAB — PHOSPHORUS: Phosphorus: 3.4 mg/dL (ref 2.5–4.6)

## 2017-04-18 LAB — TRIGLYCERIDES: TRIGLYCERIDES: 89 mg/dL (ref <150)

## 2017-04-18 LAB — PREALBUMIN: PREALBUMIN: 9.6 mg/dL — AB (ref 18–38)

## 2017-04-18 MED ORDER — PROCHLORPERAZINE EDISYLATE 5 MG/ML IJ SOLN: 5.0000 mg | INTRAMUSCULAR | Status: DC | PRN

## 2017-04-18 MED ORDER — GUAIFENESIN-DM 100-10 MG/5ML PO SYRP: 10.0000 mL | ORAL_SOLUTION | ORAL | Status: DC | PRN

## 2017-04-18 MED ORDER — LACTATED RINGERS IV BOLUS (SEPSIS)
1000.0000 mL | Freq: Three times a day (TID) | INTRAVENOUS | Status: AC | PRN
  Administered 2017-04-18: 1000 mL via INTRAVENOUS

## 2017-04-18 MED ORDER — HYDRALAZINE HCL 20 MG/ML IJ SOLN: 5.0000 mg | Freq: Four times a day (QID) | INTRAMUSCULAR | Status: DC | PRN

## 2017-04-18 MED ORDER — HYDROCORTISONE 1 % EX CREA
1.0000 "application " | TOPICAL_CREAM | Freq: Three times a day (TID) | CUTANEOUS | Status: DC | PRN
  Filled 2017-04-18: qty 28

## 2017-04-18 MED ORDER — METOCLOPRAMIDE HCL 5 MG/ML IJ SOLN
5.0000 mg | Freq: Four times a day (QID) | INTRAMUSCULAR | Status: DC | PRN
  Administered 2017-04-19 – 2017-04-24 (×9): 10 mg via INTRAVENOUS
  Filled 2017-04-18 (×9): qty 2

## 2017-04-18 MED ORDER — FLUCONAZOLE IN SODIUM CHLORIDE 200-0.9 MG/100ML-% IV SOLN
200.0000 mg | INTRAVENOUS | Status: DC
  Administered 2017-04-18: 200 mg via INTRAVENOUS
  Filled 2017-04-18: qty 100

## 2017-04-18 MED ORDER — INSULIN ASPART 100 UNIT/ML ~~LOC~~ SOLN
0.0000 [IU] | SUBCUTANEOUS | Status: DC
  Administered 2017-04-18 – 2017-04-19 (×6): 2 [IU] via SUBCUTANEOUS
  Administered 2017-04-19 (×3): 3 [IU] via SUBCUTANEOUS
  Administered 2017-04-19: 2 [IU] via SUBCUTANEOUS
  Administered 2017-04-20 (×3): 3 [IU] via SUBCUTANEOUS
  Administered 2017-04-20 (×2): 2 [IU] via SUBCUTANEOUS
  Administered 2017-04-21: 3 [IU] via SUBCUTANEOUS
  Administered 2017-04-21: 2 [IU] via SUBCUTANEOUS
  Administered 2017-04-21: 3 [IU] via SUBCUTANEOUS
  Administered 2017-04-21: 2 [IU] via SUBCUTANEOUS
  Administered 2017-04-21: 3 [IU] via SUBCUTANEOUS
  Administered 2017-04-22 (×2): 2 [IU] via SUBCUTANEOUS

## 2017-04-18 MED ORDER — HYDROCORTISONE 1 % EX CREA
1.0000 "application " | TOPICAL_CREAM | Freq: Two times a day (BID) | CUTANEOUS | Status: AC
  Administered 2017-04-18 – 2017-04-20 (×5): 1 via TOPICAL
  Filled 2017-04-18 (×2): qty 28

## 2017-04-18 MED ORDER — DIPHENHYDRAMINE HCL 50 MG/ML IJ SOLN
12.5000 mg | Freq: Four times a day (QID) | INTRAMUSCULAR | Status: DC | PRN
  Administered 2017-04-18 – 2017-04-21 (×2): 25 mg via INTRAVENOUS
  Filled 2017-04-18 (×2): qty 1

## 2017-04-18 MED ORDER — METHOCARBAMOL 1000 MG/10ML IJ SOLN
1000.0000 mg | Freq: Four times a day (QID) | INTRAMUSCULAR | Status: DC | PRN
  Administered 2017-04-20: 1000 mg via INTRAVENOUS
  Filled 2017-04-18 (×2): qty 10

## 2017-04-18 MED ORDER — POTASSIUM CHLORIDE 10 MEQ/100ML IV SOLN
10.0000 meq | INTRAVENOUS | Status: AC
  Administered 2017-04-18 (×2): 10 meq via INTRAVENOUS
  Filled 2017-04-18 (×2): qty 100

## 2017-04-18 MED ORDER — CHLORPROMAZINE HCL 25 MG/ML IJ SOLN
25.0000 mg | Freq: Four times a day (QID) | INTRAMUSCULAR | Status: DC | PRN
  Filled 2017-04-18: qty 1

## 2017-04-18 MED ORDER — MENTHOL 3 MG MT LOZG: 1.0000 | LOZENGE | OROMUCOSAL | Status: DC | PRN

## 2017-04-18 MED ORDER — HYDROCORTISONE 2.5 % RE CREA
1.0000 "application " | TOPICAL_CREAM | Freq: Four times a day (QID) | RECTAL | Status: DC | PRN
  Filled 2017-04-18: qty 28.35

## 2017-04-18 MED ORDER — TRACE MINERALS CR-CU-MN-SE-ZN 10-1000-500-60 MCG/ML IV SOLN
INTRAVENOUS | Status: AC
  Administered 2017-04-18: 18:00:00 via INTRAVENOUS
  Filled 2017-04-18: qty 1200

## 2017-04-18 MED ORDER — LIP MEDEX EX OINT
1.0000 "application " | TOPICAL_OINTMENT | Freq: Two times a day (BID) | CUTANEOUS | Status: DC
  Administered 2017-04-18 – 2017-04-25 (×10): 1 via TOPICAL
  Filled 2017-04-18: qty 7

## 2017-04-18 MED ORDER — FAT EMULSION 20 % IV EMUL
240.0000 mL | INTRAVENOUS | Status: AC
  Administered 2017-04-18: 240 mL via INTRAVENOUS
  Filled 2017-04-18: qty 250

## 2017-04-18 MED ORDER — ALUM & MAG HYDROXIDE-SIMETH 200-200-20 MG/5ML PO SUSP: 30.0000 mL | Freq: Four times a day (QID) | ORAL | Status: DC | PRN

## 2017-04-18 MED ORDER — PHENOL 1.4 % MT LIQD
1.0000 | OROMUCOSAL | Status: DC | PRN
  Filled 2017-04-18: qty 177

## 2017-04-18 MED ORDER — MAGIC MOUTHWASH
15.0000 mL | Freq: Four times a day (QID) | ORAL | Status: DC | PRN
  Filled 2017-04-18: qty 15

## 2017-04-18 MED ORDER — AZTREONAM IN DEXTROSE 2 GM/50ML IV SOLN
2.0000 g | Freq: Three times a day (TID) | INTRAVENOUS | Status: DC
  Administered 2017-04-18 – 2017-04-25 (×19): 2 g via INTRAVENOUS
  Filled 2017-04-18 (×22): qty 50

## 2017-04-18 MED ORDER — MAGNESIUM SULFATE 4 GM/100ML IV SOLN
4.0000 g | Freq: Once | INTRAVENOUS | Status: AC
  Administered 2017-04-18: 4 g via INTRAVENOUS
  Filled 2017-04-18: qty 100

## 2017-04-18 MED ORDER — MAGNESIUM SULFATE 2 GM/50ML IV SOLN
2.0000 g | Freq: Once | INTRAVENOUS | Status: DC
  Filled 2017-04-18: qty 50

## 2017-04-18 MED ORDER — LORAZEPAM 2 MG/ML IJ SOLN: 0.5000 mg | Freq: Three times a day (TID) | INTRAMUSCULAR | Status: DC | PRN

## 2017-04-18 MED ORDER — METOPROLOL TARTRATE 5 MG/5ML IV SOLN: 5.0000 mg | Freq: Four times a day (QID) | INTRAVENOUS | Status: DC | PRN

## 2017-04-18 NOTE — Progress Notes (Signed)
[address] [place], [place] [address] Phone: [phone]  FAX: [phone]      [name] [id number] 03/31/1956  CARE TEAM:  PCP: Ann Held, DO  Outpatient Care Team: Patient Care Team: Carollee Herter, Alferd Apa, DO as PCP - General (Family Medicine) Heath Lark, MD as Consulting Physician (Hematology and Oncology) Everitt Amber, MD as Consulting Physician (Obstetrics and Gynecology) Michael [name], MD as Consulting Physician (General Surgery) Alexis Frock, MD as Consulting Physician (Urology) Altheimer, Legrand Como, MD as Consulting Physician (Endocrinology) Katy Apo, MD as Consulting Physician (Ophthalmology) Stark Klein, MD as Consulting Physician (General Surgery) Irene Limbo, MD as Consulting Physician (Plastic Surgery)  Inpatient Treatment Team: Treatment Team: Attending Provider: Michael [name], MD; Consulting Physician: Dorthy Cooler Radiology, MD; Registered Nurse: Darrell Jewel, RN; Registered Nurse: Vicente Serene, RN; Registered Nurse: Nila Nephew, RN; Registered Nurse: Robley Fries, RN; Technician: Leda Quail, NT; Consulting Physician: Alexis Frock, MD   Problem List:   Principal Problem:   Pelvic fluid collection Active Problems:   Chronic neutropenia ([place])   DM type 2 (diabetes mellitus, type 2) ([place])   Benign essential HTN   History of ovarian & endometrial cancer   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 s/p resection/partial vaginectomy/ LAR/colostomy 11/19/2014   Morbid obesity ([place])   Pancytopenia, acquired ([place])   Chronic anemia   Breast cancer of upper-inner quadrant of left female breast [facility])   Ureteral stricture, right, s/p resection & bladder hitch reimplantation 04/01/2017   Hot flashes related to aromatase inhibitor therapy   Pelvic cancer s/p colostomy takedown/loop ileostomy diversion 04/01/2017   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  Check fluid for creatinine.  Notify Dr. Tresa Moore of readmission.  Broad spectrum abx for possible infection.   -Hold flagyl as she has developed a reaction to it -BMP to monitor Mag -CBC to monitor hemoglobin  -Drain creatinine -restart neupogen injections, spoke with pharmacist outside pt room -VTE prophylaxis: SCDs, etc -Ambulate as tolerated to help recovery   25 minutes spent in review, evaluation, examination, counseling, and coordination of care.  More than 50% of that time was spent in counseling.  _0 @  04/18/2017    Subjective: (Chief complaint)  NGT in place, bothering patient, reports nasal irritation. Feels a little better today.  No BMs, some flatus in ostomy bag.   Some nausea, well-controlled w/ antiemetics at this time. Rash on upper abdomen and chest from flagyl improving with Benadryl use. Abdominal pain is improving.   Objective:  Vital signs:  Vitals:   04/17/17 1650 04/17/17 1711 04/17/17 1953 04/18/17 0529  BP: 110/60 (!) 108/51 (!) 114/56 (!) 113/53  Pulse: 71 72 74 75  Resp: _1 Temp: 98.8 F (37.1 C) 98.8 F (37.1 C) 98.3 F (36.8 C) 98.9 F (37.2 C)  TempSrc: Oral Oral Oral Oral  SpO2: 100% 100% 97% 97%  Weight:      Height:        Last BM Date: 04/15/17  Intake/Output   Yesterday:  09/16 0701 - 09/17 0700 In: 5498 [I.V.:335; Blood:726; IV Piggyback:100] Out: 2225 [Urine:1275; Emesis/NG output:750; Drains:200] This shift:  No intake/output data recorded.  Bowel function:  Flatus: YES   BM:  No  Drain: Serosanguinous   Physical Exam:  General: Pt awake/alert/oriented x4 in no acute distress Eyes: PERRL, normal EOM.  Sclera clear.  No icterus Neuro: CN II-XII intact w/o focal sensory/motor deficits. Lymph: No  head/neck/groin lymphadenopathy Psych:  No delerium/psychosis/paranoia HENT: Normocephalic, Mucus membranes moist.  No thrush Neck: Supple, No tracheal deviation Chest: No chest wall pain w good excursion CV:  Pulses intact.  Regular rhythm MS: Normal AROM mjr joints.  No obvious deformity  Abdomen: Soft.  Mildy distended.  Tenderness at incisions and ostomy site.  No evidence of peritonitis.  No incarcerated hernias.  Incisions are dry and intact.  Ostomy and drain are intact.  Ext:   No deformity.  No mjr edema.  No cyanosis Skin: No petechiae / purpura  Results:   Labs: Results for orders placed or performed during the hospital encounter of 04/15/17 (from the past 48 hour(s))  Magnesium     Status: Abnormal   Collection Time: 04/16/17  9:45 AM  Result Value Ref Range   Magnesium 1.1 (L) 1.7 - 2.4 mg/dL  Phosphorus     Status: None   Collection Time: 04/16/17  9:45 AM  Result Value Ref Range   Phosphorus 2.9 2.5 - 4.6 mg/dL  Aerobic/Anaerobic Culture (surgical/deep wound)     Status: None (Preliminary result)   Collection Time: 04/16/17 12:16 PM  Result Value Ref Range   Specimen Description WOUND    Special Requests Normal    Gram Stain      ABUNDANT WBC PRESENT, PREDOMINANTLY PMN FEW GRAM POSITIVE COCCI IN PAIRS IN CHAINS RARE GRAM NEGATIVE RODS    Culture      CULTURE REINCUBATED FOR BETTER GROWTH Performed at [facility], [address] [place], [place] [address]    Report Status PENDING   Glucose, capillary     Status: None   Collection Time: 04/16/17  4:31 PM  Result Value Ref Range   Glucose-Capillary 98 65 - 99 mg/dL  HIV antibody (Routine Testing)     Status: None   Collection Time: 04/16/17  4:52 PM  Result Value Ref Range   HIV Screen 4th Generation wRfx Non Reactive Non Reactive    Comment: (NOTE) Performed At: [facility] [address] [place], [place] [id number] Lindon Romp MD UX:[id number]   Glucose, capillary     Status:  Abnormal   Collection Time: 04/16/17  8:06 PM  Result Value Ref Range   Glucose-Capillary 136 (H) 65 - 99 mg/dL   Comment 1 Notify RN    Comment 2 Document in Chart   Glucose, capillary     Status: Abnormal   Collection Time: 04/17/17 12:05 AM  Result Value Ref Range   Glucose-Capillary 157 (H) 65 - 99 mg/dL  Glucose, capillary     Status: Abnormal   Collection Time: 04/17/17  3:56 AM  Result Value Ref Range   Glucose-Capillary 163 (H) 65 - 99 mg/dL  CBC     Status: Abnormal   Collection Time: 04/17/17  5:29 AM  Result Value Ref Range   WBC 11.8 (H) 4.0 - 10.5 K/uL   RBC 2.27 (L) 3.87 - 5.11 MIL/uL   Hemoglobin 6.5 (LL) 12.0 - 15.0 g/dL    Comment: REPEATED TO VERIFY CRITICAL RESULT CALLED TO, READ BACK BY AND VERIFIED WITH: C AMOAFORO,RN _0  04/17/17 MKELLY    HCT 19.9 (L) 36.0 - 46.0 %   MCV 87.7 78.0 - 100.0 fL   MCH 28.6 26.0 - 34.0 pg  MCHC 32.7 30.0 - 36.0 g/dL   RDW 15.8 (H) 11.5 - 15.5 %   Platelets 436 (H) 150 - 400 K/uL  Comprehensive metabolic panel     Status: Abnormal   Collection Time: 04/17/17  5:29 AM  Result Value Ref Range   Sodium 136 135 - 145 mmol/L   Potassium 3.6 3.5 - 5.1 mmol/L   Chloride 104 101 - 111 mmol/L   CO2 25 22 - 32 mmol/L   Glucose, Bld 169 (H) 65 - 99 mg/dL   BUN 10 6 - 20 mg/dL   Creatinine, Ser 0.90 0.44 - 1.00 mg/dL   Calcium 7.8 (L) 8.9 - 10.3 mg/dL   Total Protein 5.5 (L) 6.5 - 8.1 g/dL   Albumin 1.7 (L) 3.5 - 5.0 g/dL   AST 14 (L) 15 - 41 U/L   ALT 12 (L) 14 - 54 U/L   Alkaline Phosphatase 202 (H) 38 - 126 U/L   Total Bilirubin 0.3 0.3 - 1.2 mg/dL   GFR calc non Af Amer >60 >60 mL/min   GFR calc Af Amer >60 >60 mL/min    Comment: (NOTE) The eGFR has been calculated using the CKD EPI equation. This calculation has not been validated in all clinical situations. eGFR's persistently <60 mL/min signify possible Chronic Kidney Disease.    Anion gap 7 5 - 15  Prealbumin     Status: Abnormal   Collection Time: 04/17/17   5:29 AM  Result Value Ref Range   Prealbumin 8.5 (L) 18 - 38 mg/dL    Comment: Performed at [facility] [address], [place], [place] [address]  Magnesium     Status: None   Collection Time: 04/17/17  5:29 AM  Result Value Ref Range   Magnesium 1.9 1.7 - 2.4 mg/dL  Phosphorus     Status: None   Collection Time: 04/17/17  5:29 AM  Result Value Ref Range   Phosphorus 3.2 2.5 - 4.6 mg/dL  Triglycerides     Status: None   Collection Time: 04/17/17  5:29 AM  Result Value Ref Range   Triglycerides 145 <150 mg/dL    Comment: Performed at [facility], [place] [address] [place],  [address]  Differential     Status: Abnormal   Collection Time: 04/17/17  5:29 AM  Result Value Ref Range   Neutrophils Relative % 85 %   Neutro Abs 10.0 (H) 1.7 - 7.7 K/uL   Lymphocytes Relative 6 %   Lymphs Abs 0.7 0.7 - 4.0 K/uL   Monocytes Relative 7 %   Monocytes Absolute 0.8 0.1 - 1.0 K/uL   Eosinophils Relative 2 %   Eosinophils Absolute 0.2 0.0 - 0.7 K/uL   Basophils Relative 0 %   Basophils Absolute 0.0 0.0 - 0.1 K/uL  Glucose, capillary     Status: Abnormal   Collection Time: 04/17/17  7:36 AM  Result Value Ref Range   Glucose-Capillary 159 (H) 65 - 99 mg/dL  Type and screen [place]     Status: None   Collection Time: 04/17/17  8:51 AM  Result Value Ref Range   ABO/RH(D) A POS    Antibody Screen NEG    Sample Expiration 04/20/2017    Unit Number [id number]    Blood Component Type RED CELLS,LR    Unit division 00    Status of Unit ISSUED,FINAL    Transfusion Status OK TO TRANSFUSE    Crossmatch Result Compatible  Unit Number [id number]    Blood Component Type RED CELLS,LR    Unit division 00    Status of Unit ISSUED,FINAL    Transfusion Status OK TO TRANSFUSE    Crossmatch Result Compatible   Prepare RBC     Status: None   Collection Time: 04/17/17  8:51 AM  Result Value Ref Range   Order Confirmation ORDER PROCESSED BY BLOOD BANK    Glucose, capillary     Status: Abnormal   Collection Time: 04/17/17  1:10 PM  Result Value Ref Range   Glucose-Capillary 131 (H) 65 - 99 mg/dL  Glucose, capillary     Status: Abnormal   Collection Time: 04/17/17  4:24 PM  Result Value Ref Range   Glucose-Capillary 135 (H) 65 - 99 mg/dL  Glucose, capillary     Status: Abnormal   Collection Time: 04/17/17  8:11 PM  Result Value Ref Range   Glucose-Capillary 134 (H) 65 - 99 mg/dL  Glucose, capillary     Status: Abnormal   Collection Time: 04/18/17 12:23 AM  Result Value Ref Range   Glucose-Capillary 152 (H) 65 - 99 mg/dL  Glucose, capillary     Status: Abnormal   Collection Time: 04/18/17  4:17 AM  Result Value Ref Range   Glucose-Capillary 155 (H) 65 - 99 mg/dL  Comprehensive metabolic panel     Status: Abnormal   Collection Time: 04/18/17  4:27 AM  Result Value Ref Range   Sodium 137 135 - 145 mmol/L   Potassium 3.7 3.5 - 5.1 mmol/L   Chloride 103 101 - 111 mmol/L   CO2 24 22 - 32 mmol/L   Glucose, Bld 153 (H) 65 - 99 mg/dL   BUN 13 6 - 20 mg/dL   Creatinine, Ser 0.79 0.44 - 1.00 mg/dL   Calcium 8.0 (L) 8.9 - 10.3 mg/dL   Total Protein 6.0 (L) 6.5 - 8.1 g/dL   Albumin 1.8 (L) 3.5 - 5.0 g/dL   AST 13 (L) 15 - 41 U/L   ALT 11 (L) 14 - 54 U/L   Alkaline Phosphatase 176 (H) 38 - 126 U/L   Total Bilirubin 0.4 0.3 - 1.2 mg/dL   GFR calc non Af Amer >60 >60 mL/min   GFR calc Af Amer >60 >60 mL/min    Comment: (NOTE) The eGFR has been calculated using the CKD EPI equation. This calculation has not been validated in all clinical situations. eGFR's persistently <60 mL/min signify possible Chronic Kidney Disease.    Anion gap 10 5 - 15  Magnesium     Status: Abnormal   Collection Time: 04/18/17  4:27 AM  Result Value Ref Range   Magnesium 1.6 (L) 1.7 - 2.4 mg/dL  Phosphorus     Status: None   Collection Time: 04/18/17  4:27 AM  Result Value Ref Range   Phosphorus 3.4 2.5 - 4.6 mg/dL  CBC     Status: Abnormal   Collection  Time: 04/18/17  4:27 AM  Result Value Ref Range   WBC 10.3 4.0 - 10.5 K/uL   RBC 2.92 (L) 3.87 - 5.11 MIL/uL   Hemoglobin 7.9 (L) 12.0 - 15.0 g/dL   HCT 24.4 (L) 36.0 - 46.0 %   MCV 83.6 78.0 - 100.0 fL   MCH 27.1 26.0 - 34.0 pg   MCHC 32.4 30.0 - 36.0 g/dL   RDW 18.6 (H) 11.5 - 15.5 %   Platelets 402 (H) 150 - 400 K/uL  Differential     Status:  Abnormal   Collection Time: 04/18/17  4:27 AM  Result Value Ref Range   Neutrophils Relative % 83 %   Lymphocytes Relative 7 %   Monocytes Relative 7 %   Eosinophils Relative 3 %   Basophils Relative 0 %   Neutro Abs 8.6 (H) 1.7 - 7.7 K/uL   Lymphs Abs 0.7 0.7 - 4.0 K/uL   Monocytes Absolute 0.7 0.1 - 1.0 K/uL   Eosinophils Absolute 0.3 0.0 - 0.7 K/uL   Basophils Absolute 0.0 0.0 - 0.1 K/uL   RBC Morphology POLYCHROMASIA PRESENT    WBC Morphology MILD LEFT SHIFT (1-5% METAS, OCC MYELO, OCC BANDS)   Glucose, capillary     Status: Abnormal   Collection Time: 04/18/17  7:53 AM  Result Value Ref Range   Glucose-Capillary 143 (H) 65 - 99 mg/dL    Imaging / Studies: Dg Chest 2 View  Result Date: 04/17/2017 CLINICAL DATA:  Fever EXAM: CHEST  2 VIEW COMPARISON:  04/16/2017 FINDINGS: Right arm PICC is unchanged with its tip in the SVC. Nasogastric tube enters the abdomen. There has been development of atelectasis in both lower lobes. Coexistent pneumonia not excluded. Upper lungs remain clear. IMPRESSION: Development of atelectasis and or pneumonia in the lower lobes. Electronically Signed   By: [name] Chimes M.D.   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 Patient underwent a complicated LAR procedure approximately 2  weeks ago by Dr. Johney Maine, however returned to the emergency department last evening with fever and decreased output from her ileostomy. CT demonstrated an indeterminate fluid collection within the midline of the lower abdomen / pelvis and as such, request made for placement of a CT-guided drainage catheter placement for infection source control purposes. EXAM: CT IMAGE GUIDED DRAINAGE BY PERCUTANEOUS CATHETER COMPARISON:  CT abdomen pelvis - 04/17/2019 MEDICATIONS: The patient is currently admitted to the hospital and receiving intravenous antibiotics. The antibiotics were administered within an appropriate time frame prior to the initiation of the procedure. ANESTHESIA/SEDATION: Moderate (conscious) sedation was employed during this procedure. A total of Versed 3 mg and Fentanyl 100 mcg was administered intravenously. Moderate Sedation Time: 17 minutes. The patient's level of consciousness and vital signs were monitored continuously by radiology nursing throughout the procedure under my direct supervision. CONTRAST:  None COMPLICATIONS: None immediate. PROCEDURE: Informed written consent was obtained from the patient after a discussion of the risks, benefits and alternatives to treatment. The patient was placed supine on the CT gantry and a pre procedural CT was performed re-demonstrating the known abscess/fluid collection within the midline of the lower abdomen and pelvis with dominant component measuring approximately 7.4 x 6.0 cm (image 21, series 2). The procedure was planned. A timeout was performed prior to the initiation of the procedure. The skin overlying the ventral aspect the right lower abdomen/pelvis was prepped and draped in the usual sterile fashion. The overlying soft tissues were anesthetized with 1% lidocaine with epinephrine. Appropriate trajectory was planned with the use of a 22 gauge spinal needle. An 18 gauge trocar needle was advanced into the abscess/fluid collection and a short Amplatz super  stiff wire was coiled within the collection. Appropriate positioning was confirmed with a limited CT scan. The tract was serially dilated allowing  placement of a 10 [place] all-purpose drainage catheter. Appropriate positioning was confirmed with a limited postprocedural CT scan. Approximately 80 cc of dark red fluid fluid was aspirated. The tube was connected to a JP bulb and sutured in place. A dressing was placed. The patient tolerated the procedure well without immediate post procedural complication. IMPRESSION: Successful CT guided placement of a 10 French all purpose drain catheter into the fluid collection within the midline of the lower abdomen/pelvis with aspiration of 80 cc of dark red fluid. Samples were sent to the laboratory as requested by the ordering clinical team. Electronically Signed   By: Sandi Mariscal M.D.   On: 04/16/2017 12:58    Medications / Allergies: per chart  Antibiotics: Anti-infectives    Start     Dose/Rate Route Frequency Ordered Stop   04/16/17 2200  vancomycin (VANCOCIN) IVPB 1000 mg/200 mL premix  Status:  Discontinued     1,000 mg 200 mL/hr over 60 Minutes Intravenous Every 12 hours 04/16/17 0917 04/16/17 1154   04/16/17 2200  vancomycin (VANCOCIN) IVPB 750 mg/150 ml premix     750 mg 150 mL/hr over 60 Minutes Intravenous Every 12 hours 04/16/17 1154     04/16/17 1800  vancomycin (VANCOCIN) IVPB 750 mg/150 ml premix  Status:  Discontinued     750 mg 150 mL/hr over 60 Minutes Intravenous Every 12 hours 04/16/17 0359 04/16/17 0652   04/16/17 1245  metroNIDAZOLE (FLAGYL) IVPB 500 mg  Status:  Discontinued     500 mg 100 mL/hr over 60 Minutes Intravenous Every 8 hours 04/16/17 1244 04/16/17 1258   04/16/17 0930  vancomycin (VANCOCIN) IVPB 1000 mg/200 mL premix     1,000 mg 200 mL/hr over 60 Minutes Intravenous  Once 04/16/17 0911 04/16/17 1043   04/16/17 0600  aztreonam (AZACTAM) injection 2 g  Status:  Discontinued     2 g Intramuscular Every 8 hours 04/16/17  0354 04/16/17 0355   04/16/17 0400  metroNIDAZOLE (FLAGYL) IVPB 500 mg  Status:  Discontinued     500 mg 100 mL/hr over 60 Minutes Intravenous Every 8 hours 04/16/17 0354 04/18/17 0815   04/16/17 0400  vancomycin (VANCOCIN) IVPB 1000 mg/200 mL premix  Status:  Discontinued     1,000 mg 200 mL/hr over 60 Minutes Intravenous  Once 04/16/17 0354 04/16/17 0911   04/16/17 0400  aztreonam (AZACTAM) 2 g in dextrose 5 % 50 mL IVPB     2 g 100 mL/hr over 30 Minutes Intravenous Every 8 hours 04/16/17 0356     04/16/17 0345  gentamicin (GARAMYCIN) 240 mg in dextrose 5 % 50 mL IVPB  Status:  Discontinued     2.5 mg/kg  96.3 kg 112 mL/hr over 30 Minutes Intravenous Every 12 hours 04/16/17 0342 04/16/17 0354        Note: Portions of this report may have been transcribed using voice recognition software. Every effort was made to ensure accuracy; however, inadvertent computerized transcription errors may be present.   Any transcriptional errors that result from this process are unintentional.     [place]ania Hall-Potvin, PA-S  _0 @  04/18/2017

## 2017-04-18 NOTE — Progress Notes (Signed)
[facility]  Active pt with [facility] for Home Health RN and [facility] for home IV hydration.   [facility] hospital team will follow pt to support DC orders for home when ready.  If patient discharges after hours, please call [phone].   [name] 04/18/2017, 9:26 AM

## 2017-04-18 NOTE — Consult Note (Signed)
[place] Nurse ostomy consult note Stoma type/location: RLQ ileostomy Stomal assessment/size: 1 and 3/8 inch, long, avascular stoma with opening at 3 o'clock at skin level. Stool exits from this opening during my pouch change. Peristomal assessment: intact, clear Treatment options for stomal/peristomal skin: skin barrier ring Output: Two small, firm pieces of stool exiting at skin level at 3 o'clock position at base of necrotic portion of long stoma.  Ostomy pouching: 2pc. 2 and 1/4 inch pouching system applied. Education provided: None. Patient and husband report that no stoma activity has occurred since Thursday pm. I perform pouch change for them today and it was during this change that patient, her husband I all noticed the stool exiting at the base of the stoma in the 3 o'clock position. Enrolled patient in [place] program: No.  Patient previously established with [employer] and a supplier. Husband is present for my assessment and pouch change today. He expressed concern that he had done something incorrectly, as the stoma had not functioned since he last changed pouch on Thursday.  He is reassured that this is not the case, that he did a perfect job with pouch preparation and placement. [place] nursing team will follow, and will remain available to this patient, the nursing, surgical and medical teams.   Thanks, Maudie Flakes, MSN, RN, [place], Arther Abbott  Pager# [phone]

## 2017-04-18 NOTE — Progress Notes (Signed)
[place] NOTE   Pharmacy Consult for TPN Indication: Bowel obstruction  Patient Measurements: Height: 5' 2.5" (158.8 cm) Weight: 212 lb 3.2 oz (96.3 kg) IBW/kg (Calculated) : 51.25Weight (04/07/17): 96.3kg  Height (04/01/17): 62.5 inches TPN AdjBW (KG): 62.5 Body mass index is 38.19 kg/m. Usual Weight:   Insulin Requirements: 20 units  Current Nutrition: NPO  IVF: LR with 12meq KCl at 126ml/hr  Central access: Double lumen PICC placed 04/06/17 TPN start date: 04/16/17  ASSESSMENT                                                                                                          HPI: 61 yo female with endometrial/ovarian cancer diagnosed in 2006, status post TAH/BSO and chemotherapy, recurrence 2016 underwent resection with colectomy & colostomy and radiation to the pelvis. On 8/31 underwent laparoscopic takedown of her colostomy with anastomosis, LOA, iliocecal ectomy and diverting ileostomy by Dr. Johney Maine. Also underwent reimplantation of her right ureter by Dr. Tresa Moore.  Now presents with fever and partial obstruction or ileus. Surgery recommended bowel rest with NG suction, TNA and broad spectrum antibiotics. Consulting IR regarding drainage of her pelvic fluid collections.  Significant events:   Today:   Glucose: Hx DM on metformin and sitagliptin PTA; CBG elevated 169 with start of TPN  Electrolytes: K 3.7, corrected Ca 9.6, Mg low, Phos WNL  Renal: SCr wnl  LFTs: AlkPhos decr-176, AST/ALT low, Tbili WNL  TGs: 89 (9/17)  Prealbumin: 9.6 (9/17)  NUTRITIONAL GOALS                                                                                             RD recs: Protein 65-75gm/day, Kcal 1600-1800/day Clinimix 5/15 at a goal rate of 60 ml/hr + 20% fat emulsion at 20 ml/hr over 12 hr to provide: 72 g/day protein, 1500Kcal/day. Prefer to use the lower Dextrose formula with elevated CBG's > will deliver less Kcal than  desired  PLAN  At 1800 today:  Magnesium 2gm bolus x1, KCl 10 mEq boluses x2 today  Contine Clinimix E 5/15 at 29ml/hr.  20% fat emulsion at 71ml/hr over 12hr.  Plan to advance as tolerated to the goal rate.  TPN to contain standard multivitamins and trace elements.  Lactated Ringers with 20 mEq KCl/L at 45ml/hr.  SSI moderate scale q4h.   TPN lab panels on Mondays & Thursdays.  BMET, Magnesium & Phos levels in am  F/u daily.  Minda Ditto PharmD Pager [phone] 04/18/2017, 3:04 PM

## 2017-04-18 NOTE — Progress Notes (Signed)
Apparently has been now recalls that patient may have had a rash to the fluconazole and not Flagyl.  We will hold both medications and follow the patient for now

## 2017-04-19 DIAGNOSIS — N2889 Other specified disorders of kidney and ureter: Secondary | ICD-10-CM

## 2017-04-19 HISTORY — DX: Other specified disorders of kidney and ureter: N28.89

## 2017-04-19 LAB — BASIC METABOLIC PANEL
ANION GAP: 8 (ref 5–15)
BUN: 13 mg/dL (ref 6–20)
CALCIUM: 8 mg/dL — AB (ref 8.9–10.3)
CO2: 26 mmol/L (ref 22–32)
CREATININE: 0.82 mg/dL (ref 0.44–1.00)
Chloride: 103 mmol/L (ref 101–111)
GLUCOSE: 156 mg/dL — AB (ref 65–99)
Potassium: 4.1 mmol/L (ref 3.5–5.1)
Sodium: 137 mmol/L (ref 135–145)

## 2017-04-19 LAB — CBC
HEMATOCRIT: 25.3 % — AB (ref 36.0–46.0)
Hemoglobin: 8.3 g/dL — ABNORMAL LOW (ref 12.0–15.0)
MCH: 28.3 pg (ref 26.0–34.0)
MCHC: 32.8 g/dL (ref 30.0–36.0)
MCV: 86.3 fL (ref 78.0–100.0)
PLATELETS: 439 10*3/uL — AB (ref 150–400)
RBC: 2.93 MIL/uL — ABNORMAL LOW (ref 3.87–5.11)
RDW: 18.6 % — AB (ref 11.5–15.5)
WBC: 28.4 10*3/uL — AB (ref 4.0–10.5)

## 2017-04-19 LAB — VANCOMYCIN, TROUGH: Vancomycin Tr: 16 ug/mL (ref 15–20)

## 2017-04-19 LAB — PHOSPHORUS: Phosphorus: 3.6 mg/dL (ref 2.5–4.6)

## 2017-04-19 LAB — GLUCOSE, CAPILLARY
Glucose-Capillary: 142 mg/dL — ABNORMAL HIGH (ref 65–99)
Glucose-Capillary: 146 mg/dL — ABNORMAL HIGH (ref 65–99)
Glucose-Capillary: 175 mg/dL — ABNORMAL HIGH (ref 65–99)
Glucose-Capillary: 132 mg/dL — ABNORMAL HIGH (ref 65–99)

## 2017-04-19 LAB — MAGNESIUM: MAGNESIUM: 1.6 mg/dL — AB (ref 1.7–2.4)

## 2017-04-19 MED ORDER — MAGNESIUM SULFATE 4 GM/100ML IV SOLN
4.0000 g | Freq: Once | INTRAVENOUS | Status: AC
  Administered 2017-04-19: 4 g via INTRAVENOUS
  Filled 2017-04-19: qty 100

## 2017-04-19 MED ORDER — TRACE MINERALS CR-CU-MN-SE-ZN 10-1000-500-60 MCG/ML IV SOLN
INTRAVENOUS | Status: AC
  Administered 2017-04-19: 19:00:00 via INTRAVENOUS
  Filled 2017-04-19: qty 1440

## 2017-04-19 MED ORDER — FAT EMULSION 20 % IV EMUL
240.0000 mL | INTRAVENOUS | Status: AC
  Administered 2017-04-19: 240 mL via INTRAVENOUS
  Filled 2017-04-19: qty 250

## 2017-04-19 NOTE — Progress Notes (Signed)
Nutrition Follow-up  DOCUMENTATION CODES:   Obesity unspecified  INTERVENTION:   Recommend daily weights while patient is receiving TPN.  TPN per Pharmacy Placed order for clear liquids per patient request  NUTRITION DIAGNOSIS:   Inadequate oral intake related to inability to eat as evidenced by NPO status.  Now on clear liquids.  GOAL:   Patient will meet greater than or equal to 90% of their needs  Progressing.  MONITOR:   PO intake, Labs, Weight trends, I & O's (TPN)  ASSESSMENT:    61 y.o. female with a history of endometrial/ovarian cancer diagnosed in 2006, status post TAH/BSO and chemotherapy.   Patient reports continued nausea which her nausea medications(Zofran & Reglan) have started to help now. Pt reports swelling in her feet. No new weights recorded. Still recommend daily weights to monitor weight trends.   Receiving TPN: Clinimix E 5/15 @ 50 ml/hr w/ 20% ILE @ 20 ml/hr x 12 hours (providing 1332 kcal and 60g protein).  Medications: IV Protonix daily, IV Reglan PRN, IV Zofran PRN Labs reviewed: CBGs: 142-146 Low Mg  Phos WNL  Pharmacy note for 9/18 pending.  Diet Order:  .TPN (CLINIMIX-E) Adult Diet clear liquid Room service appropriate? Yes; Fluid consistency: Thin  Skin:  Reviewed, no issues  Last BM:  9/17  Height:   Ht Readings from Last 1 Encounters:  04/16/17 5' 2.5" (1.588 m)    Weight:   Wt Readings from Last 1 Encounters:  04/16/17 212 lb 3.2 oz (96.3 kg)    Ideal Body Weight:  52.3 kg  BMI:  Body mass index is 38.19 kg/m.  Estimated Nutritional Needs:   Kcal:  1600-1800  Protein:  65-75g  Fluid:  1.8L/day  EDUCATION NEEDS:   Education needs addressed  Clayton Bibles, MS, RD, LDN Pager: [phone] After Hours Pager: [phone]

## 2017-04-19 NOTE — Consult Note (Signed)
[facility] Nurse ostomy follow up Stoma type/location: RLQ ileostomy Stomal assessment/size: see yesterday's measurement and note Peristomal assessment: not seen today Treatment options for stomal/peristomal skin: skin barrier ring Output: large amount of liquid effluent today after episode of crampy abdominal pain. Ostomy pouching: 2pc, 2 and 1/4inch ostomy pouching system with skin barrier ring.  Education provided: Patient is supported as she relayed her episode of pain this morning and the resultant output. Enrolled patient in [place] Discharge program: No. Patient is established with a supplier and will be using the same supplies as with her previous ostomy. [facility] nursing team will not follow, but will remain available to this patient, the nursing and medical teams.  Please re-consult if needed. Thanks, Maudie Flakes, MSN, RN, [place], Arther Abbott  Pager# [phone]

## 2017-04-19 NOTE — Progress Notes (Signed)
Pharmacy Antibiotic Note  [name] is a 61 y.o. female with endometrial/ovarian cancer admitted on 04/15/2017 with fever and partial obstruction or ileus.  Pharmacy has been consulted for vancomycin and aztreonam dosing for intraabdomal infection; MD dosing flagyl but mix up with allergies and Flagyl and Diflucan d/c'd by Md  Plan: 1) Continue Vancomycin 750mg  IV q12h - check trough tonight prior to 10pm dose, goal 15-20 mcg/mL 2) Continue Aztreonam 2g IV q8h 3) Note with Flagyl d/c'd, patient currently without any anaerobic coverage 4) Follow up renal function & cultures   Height: 5' 2.5" (158.8 cm) Weight: 212 lb 3.2 oz (96.3 kg) IBW/kg (Calculated) : 51.25  Temp (24hrs), Avg:98.8 F (37.1 C), Min:98.2 F (36.8 C), Max:99.4 F (37.4 C)   Recent Labs Lab 04/16/17 0045 04/16/17 0047 04/16/17 0525 04/17/17 0529 04/18/17 0427 04/19/17 0445  WBC  --  13.4*  --  11.8* 10.3 28.4*  CREATININE  --  1.13*  --  0.90 0.79 0.82  LATICACIDVEN 0.77  --  0.79  --   --   --     Estimated Creatinine Clearance: 77.8 mL/min (by C-G formula based on SCr of 0.82 mg/dL).    Allergies  Allergen Reactions  . Penicillins Swelling    Facial swelling Has patient had a PCN reaction causing immediate rash, facial/tongue/throat swelling, SOB or lightheadedness with hypotension: Yes Has patient had a PCN reaction causing severe rash involving mucus membranes or skin necrosis: Yes Has patient had a PCN reaction that required hospitalization No Has patient had a PCN reaction occurring within the last 10 years: No If all of the above answers are "NO", then may proceed with Cephalosporin use.   [name] Kitchen Ultram [Tramadol] Hives  . Adhesive [Tape]     blisters  . Cefaclor Rash    Ceclor  . Erythromycin     Gastritis, abd cramps  . Trimethoprim Rash  . Ciprofloxacin Other (See Comments)    Unknown On Dr notes   . Oxycodone     " I just feel weird"  . Pectin Rash    Pectin ring for stoma  . Sulfa  Antibiotics Rash     Thank you for allowing pharmacy to be a part of this patient's care.   Adrian Saran, PharmD, BCPS Pager [phone] 04/19/2017 10:32 AM

## 2017-04-19 NOTE — Progress Notes (Signed)
Patient ID: [name], female   DOB: 1956-01-23, 61 y.o.   MRN: [id number]    Referring Physician(s): Dr. Michael [name]  Supervising Physician: Aletta Edouard  Patient Status: [facility] - In-pt  Chief Complaint: Intra-abdominal fluid collection  Subjective: Patient is feeling better.  Ileostomy started working last night and is on clear liquids today with her NGT clamped.  Drain is draining well.  Allergies: Penicillins; Ultram [tramadol]; Adhesive [tape]; Cefaclor; Erythromycin; Trimethoprim; Ciprofloxacin; Oxycodone; Pectin; and Sulfa antibiotics  Medications: Prior to Admission medications   Medication Sig Start Date End Date Taking? Authorizing Provider  anastrozole (ARIMIDEX) 1 MG tablet TAKE 1 TABLET DAILY 12/08/16  Yes Heath Lark, MD  aspirin EC 81 MG tablet Take 81 mg by mouth daily.   Yes [provider]  Biotin 5 MG TABS Take 5 mg by mouth every morning.    Yes [provider]  Calcium-Magnesium-Vitamin D 400-166.7-133.3 MG-MG-UNIT TABS Take 1 tablet by mouth daily.    Yes [provider]  Cholecalciferol (VITAMIN D3) 10000 UNITS capsule Take 10,000 Units by mouth once a week. Sunday evening's   Yes [provider]  diphenhydrAMINE (BENADRYL) 25 MG tablet Take 25 mg by mouth at bedtime as needed for itching or sleep. Reported on 09/15/2015   Yes [provider]  filgrastim (NEUPOGEN) 480 MCG/1.6ML injection Inject 1.6 ml under the skin every 5 days for life Patient taking differently: Inject 480 mcg into the skin See admin instructions. Inject 1.6 ml under the skin every 6 days for life 01/06/17  Yes Gorsuch, Ni, MD  fluconazole (DIFLUCAN) 200 MG tablet Take 200 mg by mouth daily. 04/12/17  Yes [provider]  HYDROmorphone (DILAUDID) 2 MG tablet Take 1-2 tablets (2-4 mg total) by mouth every 4 (four) hours as needed for moderate pain or severe pain. 04/07/17  Yes Michael [name], MD  levothyroxine (SYNTHROID, LEVOTHROID) 150 MCG  tablet Take 150 mcg by mouth daily.   Yes [provider]  loperamide (IMODIUM) 2 MG capsule Take 1-2 capsules (2-4 mg total) by mouth every 8 (eight) hours as needed for diarrhea or loose stools (Use if >2 BM every 8 hours). 04/07/17  Yes Michael [name], MD  loratadine (CLARITIN) 10 MG tablet Take 10 mg by mouth daily.    Yes [provider]  metFORMIN (GLUCOPHAGE) 1000 MG tablet Take 1,000 mg by mouth 2 (two) times daily with a meal.    Yes [provider]  methocarbamol (ROBAXIN) 500 MG tablet Take 500 mg by mouth every 4 (four) hours as needed for muscle spasms.  04/14/17  Yes [provider]  omega-3 acid ethyl esters (LOVAZA) 1 G capsule Take 1 g by mouth 2 (two) times daily.   Yes [provider]  ondansetron (ZOFRAN-ODT) 4 MG disintegrating tablet Take 4 mg by mouth every 6 (six) hours as needed for nausea or vomiting.  04/12/17  Yes [provider]  Polyethyl Glycol-Propyl Glycol (SYSTANE OP) Place 1 drop into both eyes at bedtime.    Yes [provider]  Prenatal Vit-Fe Fumarate-FA (PRENATAL VITAMIN PO) Take 1 capsule by mouth daily. Takes prenatal because there are no dyes in it   Yes [provider]  rosuvastatin (CRESTOR) 10 MG tablet Take 10 mg by mouth every evening.    Yes [provider]  sitaGLIPtin (JANUVIA) 100 MG tablet Take 100 mg by mouth every morning.    Yes [provider]  losartan (COZAAR) 100 MG tablet Take 1 tablet (100  mg total) by mouth daily. Patient not taking: Reported on 04/16/2017 03/18/17   Heath Lark, MD    Vital Signs: BP (!) 112/47 (BP Location: Right Wrist)   Pulse 70   Temp 98.8 F (37.1 C) (Oral)   Resp 16   Ht 5' 2.5" (1.588 m)   Wt 212 lb 3.2 oz (96.3 kg)   SpO2 99%   BMI 38.19 kg/m   Physical Exam: Abd: soft, ileostomy with bilious output, drain with serous output, drain site is c/d/i.  135cc so far today.  Imaging: Dg Chest 2 View  Result Date:  04/17/2017 CLINICAL DATA:  Fever EXAM: CHEST  2 VIEW COMPARISON:  04/16/2017 FINDINGS: Right arm PICC is unchanged with its tip in the SVC. Nasogastric tube enters the abdomen. There has been development of atelectasis in both lower lobes. Coexistent pneumonia not excluded. Upper lungs remain clear. IMPRESSION: Development of atelectasis and or pneumonia in the lower lobes. Electronically Signed   By: [name] Chimes M.D.   On: 04/17/2017 13:15   Dg Chest 2 View  Result Date: 04/16/2017 CLINICAL DATA:  Fever EXAM: CHEST  2 VIEW COMPARISON:  April 01, 2017 FINDINGS: Central catheter tip is in the superior vena cava. No pneumothorax. There is no edema or consolidation. The heart size and pulmonary vascularity are normal. No adenopathy. No bone lesions. IMPRESSION: No edema or consolidation. Central catheter tip in superior vena cava. No pneumothorax. Electronically Signed   By: Lowella Grip III M.D.   On: 04/16/2017 00:35   Dg Abdomen 1 View  Result Date: 04/16/2017 CLINICAL DATA:  NGT placement. EXAM: ABDOMEN - 1 VIEW COMPARISON:  CT earlier this day. FINDINGS: Tip and side port of the enteric tube below the diaphragm in the stomach. Prominent small bowel in the left upper quadrant again seen. Right mid abdominal ostomy. Excreted intravenous contrast in the left renal collecting system and ureter which appear dilated. Right ureteral stent in place. Surgical clips in the pelvis. IMPRESSION: Tip and side port of the enteric tube below the diaphragm in the stomach. Electronically Signed   By: Jeb Levering M.D.   On: 04/16/2017 05:39   Ct Abdomen Pelvis W Contrast  Result Date: 04/16/2017 CLINICAL DATA:  Abdominal pain. Decreased ostomy output. Post low anterior resection 2 weeks prior. EXAM: CT ABDOMEN AND PELVIS WITH CONTRAST TECHNIQUE: Multidetector CT imaging of the abdomen and pelvis was performed using the standard protocol following bolus administration of intravenous contrast. CONTRAST:  164mL  ISOVUE-300 IOPAMIDOL (ISOVUE-300) INJECTION 61% COMPARISON:  CT 01/27/2017 FINDINGS: Lower chest: Bilateral lower lobe airspace opacities with air bronchograms, right greater than left. Trace bilateral pleural effusions. Hepatobiliary: No focal hepatic lesion. Postcholecystectomy with similar biliary prominence prior exam, common bile duct measures 15 mm. Normal tapering to the duodenal insertion. Pancreas: No ductal dilatation or inflammation. Spleen: Normal in size without focal abnormality. Adrenals/Urinary Tract: Normal right adrenal gland. Unchanged left adrenal adenoma Right ureteral stent in place. Decreased hydronephrosis from prior exam. There is increased left hydronephrosis from prior. Left ureter is dilated to the level of pelvic brim. Urinary bladder is decompressed by Foley catheter. Air in the bladder and right renal collecting system consistent with recent procedure. Stomach/Bowel: Lack of enteric contrast limits detailed assessment. 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 Stomach is physiologically distended. There are fluid-filled prominent small bowel loops in the left abdomen with associated mesenteric edema. Multiple small foci of air in the left small bowel mesentery in the region of inflammatory change. Stapled  off sigmoid colon in the pelvis. There is an adjacent irregular fluid collection in the presacral space, ill-defined margins. Measurements difficult to define due to irregular nature but measures approximately 5 cm. Additional pelvic fluid collection anterior to the urinary bladder containing air measures 8.1 x 7.3 cm. Vascular/Lymphatic: Normal caliber abdominal aorta. Multiple small retroperitoneal lymph nodes. No evidence pelvic adenopathy. Reproductive: Status post hysterectomy. No adnexal masses. Other: Presacral air fluid collection, ill-defined. Pelvic air fluid collection measures 8.1 x 7.3 cm anterior to the urinary bladder. Postsurgical change in the anterior  abdominal wall with multiple foci of subcutaneous air. Ill-defined fluid in the anterior upper abdominal wall without confluent well-defined abscess. Musculoskeletal: There are no acute or suspicious osseous abnormalities. IMPRESSION: 1. Post low anterior resection 2 weeks prior with pelvic abscess anterior to the bladder measuring 8.1 x 7.3 cm. Additionally ill-defined air fluid collection adjacent to the stapled off sigmoid colon in the presacral space. 2. Edematous small bowel loops in the left abdomen with mesenteric edema and multiple foci of mesenteric air. Extraluminal air should have resolved weeks postsurgical, raising concern for ischemia or bowel perforation. 3. Mild increase in left hydroureteronephrosis, with transition at the pelvic brim. Decreased right hydronephrosis with right ureteral stent in place. 4. Scattered air in the subcutaneous tissues of the anterior abdominal wall, which may be postoperative, however unusual duration. These results were called by telephone at the time of interpretation on 04/16/2017 at 3:32 am to Dr. Delora Fuel , who verbally acknowledged these results. Electronically Signed   By: Jeb Levering M.D.   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 Patient underwent a complicated LAR procedure approximately 2 weeks ago by Dr. Johney Maine, however returned to the emergency department last evening with fever and decreased output from her ileostomy. CT demonstrated an indeterminate fluid collection within the midline of the lower abdomen / pelvis and as such, request made for placement of a CT-guided drainage catheter placement for infection source  control purposes. EXAM: CT IMAGE GUIDED DRAINAGE BY PERCUTANEOUS CATHETER COMPARISON:  CT abdomen pelvis - 04/17/2019 MEDICATIONS: The patient is currently admitted to the hospital and receiving intravenous antibiotics. The antibiotics were administered within an appropriate time frame prior to the initiation of the procedure. ANESTHESIA/SEDATION: Moderate (conscious) sedation was employed during this procedure. A total of Versed 3 mg and Fentanyl 100 mcg was administered intravenously. Moderate Sedation Time: 17 minutes. The patient's level of consciousness and vital signs were monitored continuously by radiology nursing throughout the procedure under my direct supervision. CONTRAST:  None COMPLICATIONS: None immediate. PROCEDURE: Informed written consent was obtained from the patient after a discussion of the risks, benefits and alternatives to treatment. The patient was placed supine on the CT gantry and a pre procedural CT was performed re-demonstrating the known abscess/fluid collection within the midline of the lower abdomen and pelvis with dominant component measuring approximately 7.4 x 6.0 cm (image 21, series 2). The procedure was planned. A timeout was performed prior to the initiation of the procedure. The skin overlying the ventral aspect the right lower abdomen/pelvis was prepped and draped in the usual sterile fashion. The overlying soft tissues were anesthetized with 1% lidocaine with epinephrine. Appropriate trajectory was planned with the use of  a 22 gauge spinal needle. An 18 gauge trocar needle was advanced into the abscess/fluid collection and a short Amplatz super stiff wire was coiled within the collection. Appropriate positioning was confirmed with a limited CT scan. The tract was serially dilated allowing placement of a 10 [place] all-purpose drainage catheter. Appropriate positioning was confirmed with a limited postprocedural CT scan. Approximately 80 cc of dark red fluid fluid was  aspirated. The tube was connected to a JP bulb and sutured in place. A dressing was placed. The patient tolerated the procedure well without immediate post procedural complication. IMPRESSION: Successful CT guided placement of a 10 French all purpose drain catheter into the fluid collection within the midline of the lower abdomen/pelvis with aspiration of 80 cc of dark red fluid. Samples were sent to the laboratory as requested by the ordering clinical team. Electronically Signed   By: Sandi Mariscal M.D.   On: 04/16/2017 12:58    Labs:  CBC:  Recent Labs  04/16/17 0047 04/17/17 0529 04/18/17 0427 04/19/17 0445  WBC 13.4* 11.8* 10.3 28.4*  HGB 7.8* 6.5* 7.9* 8.3*  HCT 23.7* 19.9* 24.4* 25.3*  PLT 547* 436* 402* 439*    COAGS:  Recent Labs  04/16/17 0047  INR 1.21    BMP:  Recent Labs  04/16/17 0047 04/17/17 0529 04/18/17 0427 04/19/17 0445  NA 135 136 137 137  K 4.1 3.6 3.7 4.1  CL 102 104 103 103  CO2 22 25 24 26   GLUCOSE 162* 169* 153* 156*  BUN 13 10 13 13   CALCIUM 8.2* 7.8* 8.0* 8.0*  CREATININE 1.13* 0.90 0.79 0.82  GFRNONAA 51* >60 >60 >60  GFRAA 59* >60 >60 >60    LIVER FUNCTION TESTS:  Recent Labs  01/24/17 1228 04/16/17 0047 04/17/17 0529 04/18/17 0427  BILITOT 0.34 0.6 0.3 0.4  AST 16 14* 14* 13*  ALT 14 13* 12* 11*  ALKPHOS 90 265* 202* 176*  PROT 6.9 6.3* 5.5* 6.0*  ALBUMIN 3.4* 2.0* 1.7* 1.8*    Assessment and Plan: 1. Intra-abdominal fluid collection, s/p perc drain  Drain is draining well.   Cultures :  ENTEROCOCCUS FAECALIS    SERRATIA MARCESCENS    abx therapy per primary service  Cont with drain for now as output today so far is 135cc.  Cont with drain irrigations. Will follow  Electronically Signed: Lior Hoen E 04/19/2017, 2:31 PM   I spent a total of 15 Minutes at the the patient's bedside AND on the patient's hospital floor or unit, greater than 50% of which was counseling/coordinating care for intra-abdominal  abscess

## 2017-04-19 NOTE — Progress Notes (Signed)
[facility] NOTE   Pharmacy Consult for TPN Indication: Bowel obstruction  Patient Measurements: Height: 5' 2.5" (158.8 cm) Weight: 212 lb 3.2 oz (96.3 kg) IBW/kg (Calculated) : 51.25Weight (04/07/17): 96.3kg  Height (04/01/17): 62.5 inches TPN AdjBW (KG): 62.5 Body mass index is 38.19 kg/m.    Insulin Requirements: 13 units, improved  Current Nutrition: NPO  IVF: LR with 37meq KCl at 43ml/hr  Central access: Double lumen PICC placed 04/06/17 TPN start date: 04/16/17  ASSESSMENT                                                                                                          HPI: 61 yo female with endometrial/ovarian cancer diagnosed in 2006, status post TAH/BSO and chemotherapy, recurrence 2016 underwent resection with colectomy & colostomy and radiation to the pelvis. On 8/31 underwent laparoscopic takedown of her colostomy with anastomosis, LOA, iliocecal ectomy and diverting ileostomy by Dr. Johney Maine. Also underwent reimplantation of her right ureter by Dr. Tresa Moore.  Now presents with fever and partial obstruction or ileus. Surgery recommended bowel rest with NG suction, TNA and broad spectrum antibiotics. Consulting IR regarding drainage of her pelvic fluid collections.  Significant events:   Today:   Glucose: Hx DM on metformin and sitagliptin PTA; CBG fairly stable - range 117-166 last 24hr  Electrolytes: WNL except mag low at 1.6 - will replace  Renal: SCr wnl  LFTs: AlkPhos decr-176, AST/ALT low, Tbili WNL  TGs: 89 (9/17)  Prealbumin: 9.6 (9/17)  NUTRITIONAL GOALS                                                                                             RD recs: Protein 65-75gm/day, Kcal 1600-1800/day Clinimix 5/15 at a goal rate of 60 ml/hr + 20% fat emulsion at 20 ml/hr over 12 hr to provide: 72 g/day protein, 1500Kcal/day. Prefer to use the lower Dextrose formula with elevated CBG's > will deliver less Kcal than  desired  PLAN  Magnesium 4g IV x 1 today  At 1800 today:  Contine Clinimix E 5/15 and increase to goal rate of 60 ml/hr.  20% fat emulsion at 23ml/hr over 12hr.  TPN to contain standard multivitamins and trace elements.  Continue Lactated Ringers with 20 mEq KCl/L at 21ml/hr.  Continue SSI moderate scale q4h for now  TPN lab panels on Mondays & Thursdays.  CMET, Magnesium & Phos levels in am  F/u daily.   Adrian Saran, PharmD, BCPS Pager [phone] 04/19/2017 10:23 AM

## 2017-04-19 NOTE — Progress Notes (Signed)
[name] [place], [place] [address] Phone: [phone]  FAX: [phone]      Salihah Peckham [id number] Dec 11, 1955  CARE TEAM:  PCP: Ann Held, DO  Outpatient Care Team: Patient Care Team: Carollee Herter, Alferd Apa, DO as PCP - General (Family Medicine) Heath Lark, MD as Consulting Physician (Hematology and Oncology) Everitt Amber, MD as Consulting Physician (Obstetrics and Gynecology) Michael [name], MD as Consulting Physician (General Surgery) Alexis Frock, MD as Consulting Physician (Urology) Altheimer, Legrand Como, MD as Consulting Physician (Endocrinology) Katy Apo, MD as Consulting Physician (Ophthalmology) Stark Klein, MD as Consulting Physician (General Surgery) Irene Limbo, MD as Consulting Physician (Plastic Surgery)  Inpatient Treatment Team: Treatment Team: Attending Provider: Michael [name], MD; Consulting Physician: Dorthy Cooler Radiology, MD; Registered Nurse: Nila Nephew, RN; Technician: Leda Quail, NT; Consulting Physician: Alexis Frock, MD; Case Manager: Guadalupe Maple, RN   Problem List:   Principal Problem:   [facility]) Active Problems:   Chronic neutropenia ([place])   DM type 2 (diabetes mellitus, type 2) ([place])   Benign essential HTN   History of ovarian & endometrial cancer   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 s/p resection/partial vaginectomy/ LAR/colostomy 11/19/2014   Morbid obesity (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  More than 50% of that time was spent in counseling.  _0 @  04/19/2017    Subjective: (Chief complaint)  NGT in place, bothering patient, reports nasal irritation. Feels she is doing better today.  Some flatus and stool in ostomy bag.   Nausea is well-controlled w/ antiemetics at this time. Rash margins on upper abdomen and chest improving with Benadryl use. Abdominal pain is improving.    Objective:  Vital signs:  Vitals:   04/18/17 0529 04/18/17 1400 04/18/17 2206 04/19/17 0527  BP: (!) 113/53 134/65 (!) 109/50 (!) 112/47  Pulse: 75 68 78 70  Resp: _1 Temp: 98.9 F (37.2 C) 98.2 F (36.8 C) 99.4 F (37.4 C) 98.8 F (37.1 C)  TempSrc: Oral Oral Oral Oral  SpO2: 97% 100% 98% 99%  Weight:      Height:        Last BM Date: 04/18/17  Intake/Output   Yesterday:  09/17 0701 - 09/18 0700 In: 2394.2 [I.V.:1844.2; IV Piggyback:550] Out: 2070 [Urine:1500; Emesis/NG output:550; Drains:20] This shift:  Total I/O In: -  Out: 1060 [Urine:200; Drains:135; Stool:725]  Bowel function:  Flatus: YES   BM:  YES  Drain: Serosanguinous   Physical Exam:  General: Pt awake/alert/oriented x4 in no acute distress Eyes: PERRL, normal EOM.  Sclera  clear.  No icterus Neuro: CN II-XII intact w/o focal sensory/motor deficits. Lymph: No head/neck/groin lymphadenopathy Psych:  No delerium/psychosis/paranoia HENT: Normocephalic, Mucus membranes moist.  No thrush Neck: Supple,  No tracheal deviation Chest: No chest wall pain w good excursion CV:  Pulses intact.  Regular rhythm MS: Normal AROM mjr joints.  No obvious deformity  Abdomen: Soft.  Mildy distended.  Tenderness at incisions and ostomy site  No evidence of peritonitis.  No incarcerated hernias.  Incisions are dry and intact.  Ostomy and drain are intact.  Ext:   No deformity.  No mjr edema.  No cyanosis Skin: No petechiae / purpura  Results:   Labs: Results for orders placed or performed during the hospital encounter of 04/15/17 (from the past 48 hour(s))  Glucose, capillary     Status: Abnormal   Collection Time: 04/17/17  1:10 PM  Result Value Ref Range   Glucose-Capillary 131 (H) 65 - 99 mg/dL  Glucose, capillary     Status: Abnormal   Collection Time: 04/17/17  4:24 PM  Result Value Ref Range   Glucose-Capillary 135 (H) 65 - 99 mg/dL  Glucose, capillary     Status: Abnormal   Collection Time: 04/17/17  8:11 PM  Result Value Ref Range   Glucose-Capillary 134 (H) 65 - 99 mg/dL  Glucose, capillary     Status: Abnormal   Collection Time: 04/18/17 12:23 AM  Result Value Ref Range   Glucose-Capillary 152 (H) 65 - 99 mg/dL  Glucose, capillary     Status: Abnormal   Collection Time: 04/18/17  4:17 AM  Result Value Ref Range   Glucose-Capillary 155 (H) 65 - 99 mg/dL  Comprehensive metabolic panel     Status: Abnormal   Collection Time: 04/18/17  4:27 AM  Result Value Ref Range   Sodium 137 135 - 145 mmol/L   Potassium 3.7 3.5 - 5.1 mmol/L   Chloride 103 101 - 111 mmol/L   CO2 24 22 - 32 mmol/L   Glucose, Bld 153 (H) 65 - 99 mg/dL   BUN 13 6 - 20 mg/dL   Creatinine, Ser 0.79 0.44 - 1.00 mg/dL   Calcium 8.0 (L) 8.9 - 10.3 mg/dL   Total Protein 6.0 (L) 6.5 - 8.1 g/dL   Albumin 1.8 (L) 3.5 - 5.0 g/dL   AST 13 (L) 15 - 41 U/L   ALT 11 (L) 14 - 54 U/L   Alkaline Phosphatase 176 (H) 38 - 126 U/L   Total Bilirubin 0.4 0.3 - 1.2 mg/dL   GFR calc non Af Amer >60 >60 mL/min   GFR calc Af Amer  >60 >60 mL/min    Comment: (NOTE) The eGFR has been calculated using the CKD EPI equation. This calculation has not been validated in all clinical situations. eGFR's persistently <60 mL/min signify possible Chronic Kidney Disease.    Anion gap 10 5 - 15  Magnesium     Status: Abnormal   Collection Time: 04/18/17  4:27 AM  Result Value Ref Range   Magnesium 1.6 (L) 1.7 - 2.4 mg/dL  Phosphorus     Status: None   Collection Time: 04/18/17  4:27 AM  Result Value Ref Range   Phosphorus 3.4 2.5 - 4.6 mg/dL  CBC     Status: Abnormal   Collection Time: 04/18/17  4:27 AM  Result Value Ref Range   WBC 10.3 4.0 - 10.5 K/uL   RBC 2.92 (L) 3.87 - 5.11 MIL/uL   Hemoglobin 7.9 (L) 12.0 -  15.0 g/dL   HCT 24.4 (L) 36.0 - 46.0 %   MCV 83.6 78.0 - 100.0 fL   MCH 27.1 26.0 - 34.0 pg   MCHC 32.4 30.0 - 36.0 g/dL   RDW 18.6 (H) 11.5 - 15.5 %   Platelets 402 (H) 150 - 400 K/uL  Differential     Status: Abnormal   Collection Time: 04/18/17  4:27 AM  Result Value Ref Range   Neutrophils Relative % 83 %   Lymphocytes Relative 7 %   Monocytes Relative 7 %   Eosinophils Relative 3 %   Basophils Relative 0 %   Neutro Abs 8.6 (H) 1.7 - 7.7 K/uL   Lymphs Abs 0.7 0.7 - 4.0 K/uL   Monocytes Absolute 0.7 0.1 - 1.0 K/uL   Eosinophils Absolute 0.3 0.0 - 0.7 K/uL   Basophils Absolute 0.0 0.0 - 0.1 K/uL   RBC Morphology POLYCHROMASIA PRESENT    WBC Morphology MILD LEFT SHIFT (1-5% METAS, OCC MYELO, OCC BANDS)   Triglycerides     Status: None   Collection Time: 04/18/17  4:27 AM  Result Value Ref Range   Triglycerides 89 <150 mg/dL    Comment: Performed at [facility], [place] [address] [place], [place] [address]  Prealbumin     Status: Abnormal   Collection Time: 04/18/17  4:27 AM  Result Value Ref Range   Prealbumin 9.6 (L) 18 - 38 mg/dL    Comment: Performed at [facility] [address], [place], [place] [address]  Glucose, capillary     Status: Abnormal   Collection Time: 04/18/17   7:53 AM  Result Value Ref Range   Glucose-Capillary 143 (H) 65 - 99 mg/dL  Creatinine, fluid (JP Drainage)     Status: None   Collection Time: 04/18/17 10:29 AM  Result Value Ref Range   Creat, Fluid 12.1 mg/dL    Comment: (NOTE) No normal range established for this test Results should be evaluated in conjunction with serum values Performed at [facility] [address] [place], [place] [address]    Fluid Type-FCRE JP DRAINAGE   Glucose, capillary     Status: Abnormal   Collection Time: 04/18/17 12:01 PM  Result Value Ref Range   Glucose-Capillary 141 (H) 65 - 99 mg/dL  Glucose, capillary     Status: Abnormal   Collection Time: 04/18/17  4:10 PM  Result Value Ref Range   Glucose-Capillary 148 (H) 65 - 99 mg/dL  Glucose, capillary     Status: Abnormal   Collection Time: 04/18/17  8:02 PM  Result Value Ref Range   Glucose-Capillary 117 (H) 65 - 99 mg/dL  Glucose, capillary     Status: Abnormal   Collection Time: 04/18/17 11:40 PM  Result Value Ref Range   Glucose-Capillary 166 (H) 65 - 99 mg/dL  Glucose, capillary     Status: Abnormal   Collection Time: 04/19/17  3:50 AM  Result Value Ref Range   Glucose-Capillary 142 (H) 65 - 99 mg/dL  CBC     Status: Abnormal   Collection Time: 04/19/17  4:45 AM  Result Value Ref Range   WBC 28.4 (H) 4.0 - 10.5 K/uL   RBC 2.93 (L) 3.87 - 5.11 MIL/uL   Hemoglobin 8.3 (L) 12.0 - 15.0 g/dL   HCT 25.3 (L) 36.0 - 46.0 %   MCV 86.3 78.0 - 100.0 fL   MCH 28.3 26.0 - 34.0 pg   MCHC 32.8 30.0 - 36.0 g/dL  RDW 18.6 (H) 11.5 - 15.5 %   Platelets 439 (H) 150 - 400 K/uL  Basic metabolic panel     Status: Abnormal   Collection Time: 04/19/17  4:45 AM  Result Value Ref Range   Sodium 137 135 - 145 mmol/L   Potassium 4.1 3.5 - 5.1 mmol/L   Chloride 103 101 - 111 mmol/L   CO2 26 22 - 32 mmol/L   Glucose, Bld 156 (H) 65 - 99 mg/dL   BUN 13 6 - 20 mg/dL   Creatinine, Ser 0.82 0.44 - 1.00 mg/dL   Calcium 8.0 (L) 8.9 - 10.3 mg/dL   GFR  calc non Af Amer >60 >60 mL/min   GFR calc Af Amer >60 >60 mL/min    Comment: (NOTE) The eGFR has been calculated using the CKD EPI equation. This calculation has not been validated in all clinical situations. eGFR's persistently <60 mL/min signify possible Chronic Kidney Disease.    Anion gap 8 5 - 15  Magnesium     Status: Abnormal   Collection Time: 04/19/17  4:45 AM  Result Value Ref Range   Magnesium 1.6 (L) 1.7 - 2.4 mg/dL  Phosphorus     Status: None   Collection Time: 04/19/17  4:45 AM  Result Value Ref Range   Phosphorus 3.6 2.5 - 4.6 mg/dL  Glucose, capillary     Status: Abnormal   Collection Time: 04/19/17  7:54 AM  Result Value Ref Range   Glucose-Capillary 146 (H) 65 - 99 mg/dL    Imaging / Studies: Dg Chest 2 View  Result Date: 04/17/2017 CLINICAL DATA:  Fever EXAM: CHEST  2 VIEW COMPARISON:  04/16/2017 FINDINGS: Right arm PICC is unchanged with its tip in the SVC. Nasogastric tube enters the abdomen. There has been development of atelectasis in both lower lobes. Coexistent pneumonia not excluded. Upper lungs remain clear. IMPRESSION: Development of atelectasis and or pneumonia in the lower lobes. Electronically Signed   By: [name] Chimes M.D.   On: 04/17/2017 13:15    Medications / Allergies: per chart  Antibiotics: Anti-infectives    Start     Dose/Rate Route Frequency Ordered Stop   04/18/17 2200  aztreonam (AZACTAM) 2 GM IVPB     2 g 100 mL/hr over 30 Minutes Intravenous Every 8 hours 04/18/17 1913     04/18/17 1000  fluconazole (DIFLUCAN) IVPB 200 mg  Status:  Discontinued     200 mg 100 mL/hr over 60 Minutes Intravenous Every 24 hours 04/18/17 0836 04/18/17 1448   04/16/17 2200  vancomycin (VANCOCIN) IVPB 1000 mg/200 mL premix  Status:  Discontinued     1,000 mg 200 mL/hr over 60 Minutes Intravenous Every 12 hours 04/16/17 0917 04/16/17 1154   04/16/17 2200  vancomycin (VANCOCIN) IVPB 750 mg/150 ml premix     750 mg 150 mL/hr over 60 Minutes Intravenous  Every 12 hours 04/16/17 1154     04/16/17 1800  vancomycin (VANCOCIN) IVPB 750 mg/150 ml premix  Status:  Discontinued     750 mg 150 mL/hr over 60 Minutes Intravenous Every 12 hours 04/16/17 0359 04/16/17 0652   04/16/17 1245  metroNIDAZOLE (FLAGYL) IVPB 500 mg  Status:  Discontinued     500 mg 100 mL/hr over 60 Minutes Intravenous Every 8 hours 04/16/17 1244 04/16/17 1258   04/16/17 0930  vancomycin (VANCOCIN) IVPB 1000 mg/200 mL premix     1,000 mg 200 mL/hr over 60 Minutes Intravenous  Once 04/16/17 0911 04/16/17 1043   04/16/17  0600  aztreonam (AZACTAM) injection 2 g  Status:  Discontinued     2 g Intramuscular Every 8 hours 04/16/17 0354 04/16/17 0355   04/16/17 0400  metroNIDAZOLE (FLAGYL) IVPB 500 mg  Status:  Discontinued     500 mg 100 mL/hr over 60 Minutes Intravenous Every 8 hours 04/16/17 0354 04/18/17 0815   04/16/17 0400  vancomycin (VANCOCIN) IVPB 1000 mg/200 mL premix  Status:  Discontinued     1,000 mg 200 mL/hr over 60 Minutes Intravenous  Once 04/16/17 0354 04/16/17 0911   04/16/17 0400  aztreonam (AZACTAM) 2 g in dextrose 5 % 50 mL IVPB  Status:  Discontinued     2 g 100 mL/hr over 30 Minutes Intravenous Every 8 hours 04/16/17 0356 04/18/17 1913   04/16/17 0345  gentamicin (GARAMYCIN) 240 mg in dextrose 5 % 50 mL IVPB  Status:  Discontinued     2.5 mg/kg  96.3 kg 112 mL/hr over 30 Minutes Intravenous Every 12 hours 04/16/17 0342 04/16/17 0354        Note: Portions of this report may have been transcribed using voice recognition software. Every effort was made to ensure accuracy; however, inadvertent computerized transcription errors may be present.   Any transcriptional errors that result from this process are unintentional.     [place] Hall-Potvin, PA-S  _0 @  04/19/2017

## 2017-04-19 NOTE — Progress Notes (Signed)
Subjective/Chief Complaint:  1 - Bladder Injury / Right Ureteral Stricture -s/p robotic RIGHT ureteral reimplant with psoas hitch + HM Flap 04/01/17 at time of colostomy take down / adhesiolysis, small bowel resection, loop iliostomy for right distal stricture / bladder injury sustained at pelvic resection for advanced endometrial cancer 2016. Drain removed 9/6 as Cr same as serum and output scant.   2 - Pelvic Urinoma / Ileus - readmission 04/16/17 with dehydration, low iliostomy output and new anterior pelvic fluid collection. Drained and Cr 12.1 (c/w urinoma), delayed contast series show good renal excretion, bladder filling, but no direct communication with urinoma suggesting no active leak.   Today "[name]" is stable. Feeling somewhate better with IV hydration and iliosotmy now putting out again. Urinoma drain with <282mL output last 24 hrs.    Objective: Vital signs in last 24 hours: Temp:  [98.2 F (36.8 C)-99.4 F (37.4 C)] 98.8 F (37.1 C) (09/18 0527) Pulse Rate:  [68-78] 70 (09/18 0527) Resp:  [16-17] 16 (09/18 0527) BP: (109-134)/(47-65) 112/47 (09/18 0527) SpO2:  [98 %-100 %] 99 % (09/18 0527) Last BM Date: 04/18/17  Intake/Output from previous day: 09/17 0701 - 09/18 0700 In: 2394.2 [I.V.:1844.2; IV Piggyback:550] Out: 2070 [Urine:1500; Emesis/NG output:550; Drains:20] Intake/Output this shift: Total I/O In: -  Out: 625 [Drains:100; Stool:525]  General appearance: alert, cooperative and some weight loss noted.  Eyes: negative Nose: Nares normal. Septum midline. Mucosa normal. No drainage or sinus tenderness. Throat: lips, mucosa, and tongue normal; teeth and gums normal Neck: supple, symmetrical, trachea midline Resp: non-labored on minimal [place] O2.  Cardio: Nl rate at present.  GI: soft, non-tender; bowel sounds normal; no masses,  no organomegaly and RLQ iliostomy patent. some blanchign erythema upper chest that is non-progressive. Recent surgiclal scars w/o  drainage.  Pelvic: external genitalia normal and foley in place with yellow urine. Pelvic urinoma drain in place wtih some simple non-foul output.  Extremities: extremities normal, atraumatic, no cyanosis or edema Skin: Skin color, texture, turgor normal. No rashes or lesions Neurologic: Grossly normal  Lab Results:   Recent Labs  04/18/17 0427 04/19/17 0445  WBC 10.3 28.4*  HGB 7.9* 8.3*  HCT 24.4* 25.3*  PLT 402* 439*   BMET  Recent Labs  04/18/17 0427 04/19/17 0445  NA 137 137  K 3.7 4.1  CL 103 103  CO2 24 26  GLUCOSE 153* 156*  BUN 13 13  CREATININE 0.79 0.82  CALCIUM 8.0* 8.0*   PT/INR No results for input(s): LABPROT, INR in the last 72 hours. ABG No results for input(s): PHART, HCO3 in the last 72 hours.  Invalid input(s): PCO2, PO2  Studies/Results: Dg Chest 2 View  Result Date: 04/17/2017 CLINICAL DATA:  Fever EXAM: CHEST  2 VIEW COMPARISON:  04/16/2017 FINDINGS: Right arm PICC is unchanged with its tip in the SVC. Nasogastric tube enters the abdomen. There has been development of atelectasis in both lower lobes. Coexistent pneumonia not excluded. Upper lungs remain clear. IMPRESSION: Development of atelectasis and or pneumonia in the lower lobes. Electronically Signed   By: [name] Chimes M.D.   On: 04/17/2017 13:15    Anti-infectives: Anti-infectives    Start     Dose/Rate Route Frequency Ordered Stop   04/18/17 2200  aztreonam (AZACTAM) 2 GM IVPB     2 g 100 mL/hr over 30 Minutes Intravenous Every 8 hours 04/18/17 1913     04/18/17 1000  fluconazole (DIFLUCAN) IVPB 200 mg  Status:  Discontinued  200 mg 100 mL/hr over 60 Minutes Intravenous Every 24 hours 04/18/17 0836 04/18/17 1448   04/16/17 2200  vancomycin (VANCOCIN) IVPB 1000 mg/200 mL premix  Status:  Discontinued     1,000 mg 200 mL/hr over 60 Minutes Intravenous Every 12 hours 04/16/17 0917 04/16/17 1154   04/16/17 2200  vancomycin (VANCOCIN) IVPB 750 mg/150 ml premix     750 mg 150  mL/hr over 60 Minutes Intravenous Every 12 hours 04/16/17 1154     04/16/17 1800  vancomycin (VANCOCIN) IVPB 750 mg/150 ml premix  Status:  Discontinued     750 mg 150 mL/hr over 60 Minutes Intravenous Every 12 hours 04/16/17 0359 04/16/17 0652   04/16/17 1245  metroNIDAZOLE (FLAGYL) IVPB 500 mg  Status:  Discontinued     500 mg 100 mL/hr over 60 Minutes Intravenous Every 8 hours 04/16/17 1244 04/16/17 1258   04/16/17 0930  vancomycin (VANCOCIN) IVPB 1000 mg/200 mL premix     1,000 mg 200 mL/hr over 60 Minutes Intravenous  Once 04/16/17 0911 04/16/17 1043   04/16/17 0600  aztreonam (AZACTAM) injection 2 g  Status:  Discontinued     2 g Intramuscular Every 8 hours 04/16/17 0354 04/16/17 0355   04/16/17 0400  metroNIDAZOLE (FLAGYL) IVPB 500 mg  Status:  Discontinued     500 mg 100 mL/hr over 60 Minutes Intravenous Every 8 hours 04/16/17 0354 04/18/17 0815   04/16/17 0400  vancomycin (VANCOCIN) IVPB 1000 mg/200 mL premix  Status:  Discontinued     1,000 mg 200 mL/hr over 60 Minutes Intravenous  Once 04/16/17 0354 04/16/17 0911   04/16/17 0400  aztreonam (AZACTAM) 2 g in dextrose 5 % 50 mL IVPB  Status:  Discontinued     2 g 100 mL/hr over 30 Minutes Intravenous Every 8 hours 04/16/17 0356 04/18/17 1913   04/16/17 0345  gentamicin (GARAMYCIN) 240 mg in dextrose 5 % 50 mL IVPB  Status:  Discontinued     2.5 mg/kg  96.3 kg 112 mL/hr over 30 Minutes Intravenous Every 12 hours 04/16/17 0342 04/16/17 0354        1 - Bladder Injury / Right Ureteral Stricture -no further intervention this admission. Current stent and foley to remain.   2 - Pelvic Urinoma / Ileus - suspect small volume urine leak that has since resolved given no contrast extra into collection. Agree with keep current dain until output nil, recheck drain Cr, and then remove.    [facility], [name] 04/19/2017

## 2017-04-19 NOTE — Progress Notes (Signed)
Pharmacy - Brief Note (vancomycin level follow-up)  Vancomycin for IAI (enterococcus and serratia growing in culture, patient with PCN allergy)  9/18 Vancomycin trough at 2230 = 16 mcg/mL on 750mg  IV q12 (prior to 7th dose)  Plan:  Continue vancomycin at current dose as vancomycin trough is acceptable  Monitor renal function  Doreene Eland, PharmD, BCPS.   Pager: [phone] 04/19/2017 11:27 PM

## 2017-04-20 DIAGNOSIS — N2889 Other specified disorders of kidney and ureter: Secondary | ICD-10-CM

## 2017-04-20 DIAGNOSIS — E119 Type 2 diabetes mellitus without complications: Secondary | ICD-10-CM

## 2017-04-20 DIAGNOSIS — Z880 Allergy status to penicillin: Secondary | ICD-10-CM

## 2017-04-20 DIAGNOSIS — N739 Female pelvic inflammatory disease, unspecified: Secondary | ICD-10-CM

## 2017-04-20 LAB — CBC
HEMATOCRIT: 24 % — AB (ref 36.0–46.0)
HEMOGLOBIN: 7.7 g/dL — AB (ref 12.0–15.0)
MCH: 28 pg (ref 26.0–34.0)
MCHC: 32.1 g/dL (ref 30.0–36.0)
MCV: 87.3 fL (ref 78.0–100.0)
Platelets: 417 10*3/uL — ABNORMAL HIGH (ref 150–400)
RBC: 2.75 MIL/uL — ABNORMAL LOW (ref 3.87–5.11)
RDW: 18.5 % — ABNORMAL HIGH (ref 11.5–15.5)
WBC: 24.8 10*3/uL — ABNORMAL HIGH (ref 4.0–10.5)

## 2017-04-20 LAB — MAGNESIUM: MAGNESIUM: 1.8 mg/dL (ref 1.7–2.4)

## 2017-04-20 LAB — COMPREHENSIVE METABOLIC PANEL
ALK PHOS: 177 U/L — AB (ref 38–126)
ALT: 10 U/L — ABNORMAL LOW (ref 14–54)
ANION GAP: 8 (ref 5–15)
AST: 13 U/L — ABNORMAL LOW (ref 15–41)
Albumin: 1.8 g/dL — ABNORMAL LOW (ref 3.5–5.0)
BILIRUBIN TOTAL: 0.4 mg/dL (ref 0.3–1.2)
BUN: 13 mg/dL (ref 6–20)
CO2: 26 mmol/L (ref 22–32)
Calcium: 8.1 mg/dL — ABNORMAL LOW (ref 8.9–10.3)
Chloride: 104 mmol/L (ref 101–111)
Creatinine, Ser: 0.8 mg/dL (ref 0.44–1.00)
GFR calc non Af Amer: >60 mL/min (ref >60)
Glucose, Bld: 156 mg/dL — ABNORMAL HIGH (ref 65–99)
Potassium: 4.4 mmol/L (ref 3.5–5.1)
Sodium: 138 mmol/L (ref 135–145)
TOTAL PROTEIN: 5.8 g/dL — AB (ref 6.5–8.1)

## 2017-04-20 LAB — GLUCOSE, CAPILLARY
GLUCOSE-CAPILLARY: 122 mg/dL — AB (ref 65–99)
GLUCOSE-CAPILLARY: 151 mg/dL — AB (ref 65–99)
Glucose-Capillary: 124 mg/dL — ABNORMAL HIGH (ref 65–99)
Glucose-Capillary: 140 mg/dL — ABNORMAL HIGH (ref 65–99)
Glucose-Capillary: 159 mg/dL — ABNORMAL HIGH (ref 65–99)
Glucose-Capillary: 164 mg/dL — ABNORMAL HIGH (ref 65–99)
Glucose-Capillary: 186 mg/dL — ABNORMAL HIGH (ref 65–99)

## 2017-04-20 LAB — PHOSPHORUS: PHOSPHORUS: 3.9 mg/dL (ref 2.5–4.6)

## 2017-04-20 MED ORDER — ONDANSETRON 4 MG PO TBDP: 4.0000 mg | ORAL_TABLET | Freq: Four times a day (QID) | ORAL | Status: DC | PRN

## 2017-04-20 MED ORDER — ROSUVASTATIN CALCIUM 10 MG PO TABS
10.0000 mg | ORAL_TABLET | Freq: Every evening | ORAL | Status: DC
  Administered 2017-04-20 – 2017-04-24 (×5): 10 mg via ORAL
  Filled 2017-04-20 (×5): qty 1

## 2017-04-20 MED ORDER — LINAGLIPTIN 5 MG PO TABS
5.0000 mg | ORAL_TABLET | Freq: Every day | ORAL | Status: DC
  Administered 2017-04-20 – 2017-04-25 (×6): 5 mg via ORAL
  Filled 2017-04-20 (×6): qty 1

## 2017-04-20 MED ORDER — FAT EMULSION 20 % IV EMUL
240.0000 mL | INTRAVENOUS | Status: AC
  Administered 2017-04-20: 240 mL via INTRAVENOUS
  Filled 2017-04-20: qty 250

## 2017-04-20 MED ORDER — LEVOTHYROXINE SODIUM 75 MCG PO TABS
150.0000 ug | ORAL_TABLET | Freq: Every day | ORAL | Status: DC
  Administered 2017-04-21 – 2017-04-25 (×5): 150 ug via ORAL
  Filled 2017-04-20 (×5): qty 2

## 2017-04-20 MED ORDER — VITAMIN D3 25 MCG (1000 UNIT) PO TABS
10000.0000 [IU] | ORAL_TABLET | ORAL | Status: DC
  Filled 2017-04-20: qty 10

## 2017-04-20 MED ORDER — ANASTROZOLE 1 MG PO TABS
1.0000 mg | ORAL_TABLET | Freq: Every day | ORAL | Status: DC
  Administered 2017-04-20 – 2017-04-25 (×6): 1 mg via ORAL
  Filled 2017-04-20 (×6): qty 1

## 2017-04-20 MED ORDER — LORATADINE 10 MG PO TABS
10.0000 mg | ORAL_TABLET | Freq: Every day | ORAL | Status: DC
  Administered 2017-04-20 – 2017-04-25 (×6): 10 mg via ORAL
  Filled 2017-04-20 (×6): qty 1

## 2017-04-20 MED ORDER — CLINIMIX E/DEXTROSE (5/15) 5 % IV SOLN
INTRAVENOUS | Status: AC
  Administered 2017-04-20: 17:00:00 via INTRAVENOUS
  Filled 2017-04-20: qty 1440

## 2017-04-20 MED ORDER — PRENATAL VITAMIN 27-0.8 MG PO TABS: 1.0000 | ORAL_TABLET | Freq: Every day | ORAL | Status: DC

## 2017-04-20 MED ORDER — ASPIRIN EC 81 MG PO TBEC
81.0000 mg | DELAYED_RELEASE_TABLET | Freq: Every day | ORAL | Status: DC
  Administered 2017-04-20 – 2017-04-25 (×6): 81 mg via ORAL
  Filled 2017-04-20 (×6): qty 1

## 2017-04-20 MED ORDER — METHOCARBAMOL 500 MG PO TABS
500.0000 mg | ORAL_TABLET | ORAL | Status: DC | PRN
  Administered 2017-04-22 (×2): 500 mg via ORAL
  Filled 2017-04-20 (×2): qty 1

## 2017-04-20 MED ORDER — PRENATAL VITAMIN 27-0.8 MG PO TABS: ORAL_TABLET | Freq: Every day | ORAL | Status: DC

## 2017-04-20 MED ORDER — HYDROMORPHONE HCL 2 MG PO TABS
2.0000 mg | ORAL_TABLET | ORAL | Status: DC | PRN
  Administered 2017-04-20 – 2017-04-22 (×12): 4 mg via ORAL
  Administered 2017-04-23: 2 mg via ORAL
  Administered 2017-04-23 (×3): 4 mg via ORAL
  Administered 2017-04-24 – 2017-04-25 (×7): 2 mg via ORAL
  Filled 2017-04-20 (×2): qty 1
  Filled 2017-04-20 (×2): qty 2
  Filled 2017-04-20: qty 1
  Filled 2017-04-20 (×2): qty 2
  Filled 2017-04-20: qty 1
  Filled 2017-04-20 (×8): qty 2
  Filled 2017-04-20 (×3): qty 1
  Filled 2017-04-20 (×3): qty 2
  Filled 2017-04-20 (×2): qty 1

## 2017-04-20 MED ORDER — PRENATAL MULTIVITAMIN CH
1.0000 | ORAL_TABLET | Freq: Every day | ORAL | Status: DC
  Administered 2017-04-20 – 2017-04-24 (×5): 1 via ORAL
  Filled 2017-04-20 (×7): qty 1

## 2017-04-20 MED ORDER — PREMIER PROTEIN SHAKE
11.0000 [oz_av] | Freq: Two times a day (BID) | ORAL | Status: DC
  Administered 2017-04-20 – 2017-04-25 (×6): 11 [oz_av] via ORAL

## 2017-04-20 NOTE — Progress Notes (Signed)
[place] NOTE   Pharmacy Consult for TPN Indication: Bowel obstruction  Patient Measurements: Height: 5' 2.5" (158.8 cm) Weight: 212 lb 3.2 oz (96.3 kg) IBW/kg (Calculated) : 51.25Weight (04/07/17): 96.3kg  Height (04/01/17): 62.5 inches TPN AdjBW (KG): 62.5 Body mass index is 38.19 kg/m.    Insulin Requirements: 17 units, slightly worsened  Current Nutrition: NPO  IVF: LR with 32meq KCl at 5ml/hr  Central access: Double lumen PICC placed 04/06/17 TPN start date: 04/16/17  ASSESSMENT                                                                                                          HPI: 61 yo female with endometrial/ovarian cancer diagnosed in 2006, status post TAH/BSO and chemotherapy, recurrence 2016 underwent resection with colectomy & colostomy and radiation to the pelvis. On 8/31 underwent laparoscopic takedown of her colostomy with anastomosis, LOA, iliocecal ectomy and diverting ileostomy by Dr. Johney Maine. Also underwent reimplantation of her right ureter by Dr. Tresa Moore.  Now presents with fever and partial obstruction or ileus. Surgery recommended bowel rest with NG suction, TNA and broad spectrum antibiotics. Consulting IR regarding drainage of her pelvic fluid collections.  Significant events:   Today:   Glucose: Hx DM on metformin and sitagliptin PTA; CBG fairly stable - range 117-166 last 24hr  Electrolytes: WNL except mag low at 1.6 - will replace  Renal: SCr wnl  LFTs: AlkPhos decr-176, AST/ALT low, Tbili WNL  TGs: 89 (9/17)  Prealbumin: 9.6 (9/17)  NUTRITIONAL GOALS                                                                                             RD recs: Protein 65-75gm/day, Kcal 1600-1800/day Clinimix 5/15 at a goal rate of 60 ml/hr + 20% fat emulsion at 20 ml/hr over 12 hr to provide: 72 g/day protein, 1500Kcal/day. Prefer to use the lower Dextrose formula with elevated CBG's > will deliver less Kcal  than desired  PLAN  Note that TPN running at 50 ml/hr instead of 60 m/hr At 1800 today:  Contine Clinimix E 5/15 at goal rate of 60 ml/hr.  20% fat emulsion at 16ml/hr over 12hr.  Prenatal Vitamin ordered po.  Continue SSI moderate scale q4h for now  TPN lab panels on Mondays & Thursdays.  F/u daily.  Minda Ditto PharmD Pager [phone] 04/20/2017, 3:27 PM

## 2017-04-20 NOTE — Progress Notes (Signed)
Referring Physician(s): Gross,S  Supervising Physician: Arne Cleveland  Patient Status:  [facility] - In-pt  Chief Complaint:  lower abdominal/pelvic fluid collection   Subjective: Pt doing fair today; has had sig output from lower abd drain and fluid now turbid, brown   Allergies: Penicillins; Ultram [tramadol]; Adhesive [tape]; Cefaclor; Erythromycin; Trimethoprim; Ciprofloxacin; Oxycodone; Pectin; and Sulfa antibiotics  Medications: Prior to Admission medications   Medication Sig Start Date End Date Taking? Authorizing Provider  anastrozole (ARIMIDEX) 1 MG tablet TAKE 1 TABLET DAILY 12/08/16  Yes Heath Lark, MD  aspirin EC 81 MG tablet Take 81 mg by mouth daily.   Yes [provider]  Biotin 5 MG TABS Take 5 mg by mouth every morning.    Yes [provider]  Calcium-Magnesium-Vitamin D 400-166.7-133.3 MG-MG-UNIT TABS Take 1 tablet by mouth daily.    Yes [provider]  Cholecalciferol (VITAMIN D3) 10000 UNITS capsule Take 10,000 Units by mouth once a week. Sunday evening's   Yes [provider]  diphenhydrAMINE (BENADRYL) 25 MG tablet Take 25 mg by mouth at bedtime as needed for itching or sleep. Reported on 09/15/2015   Yes [provider]  filgrastim (NEUPOGEN) 480 MCG/1.6ML injection Inject 1.6 ml under the skin every 5 days for life Patient taking differently: Inject 480 mcg into the skin See admin instructions. Inject 1.6 ml under the skin every 6 days for life 01/06/17  Yes Gorsuch, Ni, MD  fluconazole (DIFLUCAN) 200 MG tablet Take 200 mg by mouth daily. 04/12/17  Yes [provider]  HYDROmorphone (DILAUDID) 2 MG tablet Take 1-2 tablets (2-4 mg total) by mouth every 4 (four) hours as needed for moderate pain or severe pain. 04/07/17  Yes Michael [name], MD  levothyroxine (SYNTHROID, LEVOTHROID) 150 MCG tablet Take 150 mcg by mouth daily.   Yes [provider]  loperamide (IMODIUM) 2 MG capsule Take 1-2 capsules (2-4 mg  total) by mouth every 8 (eight) hours as needed for diarrhea or loose stools (Use if >2 BM every 8 hours). 04/07/17  Yes Michael [name], MD  loratadine (CLARITIN) 10 MG tablet Take 10 mg by mouth daily.    Yes [provider]  metFORMIN (GLUCOPHAGE) 1000 MG tablet Take 1,000 mg by mouth 2 (two) times daily with a meal.    Yes [provider]  methocarbamol (ROBAXIN) 500 MG tablet Take 500 mg by mouth every 4 (four) hours as needed for muscle spasms.  04/14/17  Yes [provider]  omega-3 acid ethyl esters (LOVAZA) 1 G capsule Take 1 g by mouth 2 (two) times daily.   Yes [provider]  ondansetron (ZOFRAN-ODT) 4 MG disintegrating tablet Take 4 mg by mouth every 6 (six) hours as needed for nausea or vomiting.  04/12/17  Yes [provider]  Polyethyl Glycol-Propyl Glycol (SYSTANE OP) Place 1 drop into both eyes at bedtime.    Yes [provider]  Prenatal Vit-Fe Fumarate-FA (PRENATAL VITAMIN PO) Take 1 capsule by mouth daily. Takes prenatal because there are no dyes in it   Yes [provider]  rosuvastatin (CRESTOR) 10 MG tablet Take 10 mg by mouth every evening.    Yes [provider]  sitaGLIPtin (JANUVIA) 100 MG tablet Take 100 mg by mouth every morning.    Yes [provider]  losartan (COZAAR) 100 MG tablet Take 1 tablet (100 mg total) by mouth daily. Patient not taking: Reported on 04/16/2017 03/18/17   Heath Lark, MD  Vital Signs: BP (!) 113/54 (BP Location: Right Wrist)   Pulse 60   Temp 98.2 F (36.8 C) (Oral)   Resp 18   Ht 5' 2.5" (1.588 m)   Wt 212 lb 3.2 oz (96.3 kg)   SpO2 99%   BMI 38.19 kg/m   Physical Examlower abd drain intact, dressing dry, mildly tender, output 745 cc turbid, brown fluid ?feculent  Imaging: Dg Chest 2 View  Result Date: 04/17/2017 CLINICAL DATA:  Fever EXAM: CHEST  2 VIEW COMPARISON:  04/16/2017 FINDINGS: Right arm PICC is unchanged with its tip in the SVC. Nasogastric  tube enters the abdomen. There has been development of atelectasis in both lower lobes. Coexistent pneumonia not excluded. Upper lungs remain clear. IMPRESSION: Development of atelectasis and or pneumonia in the lower lobes. Electronically Signed   By: [name] Chimes M.D.   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 Patient underwent a complicated LAR procedure approximately 2 weeks ago by Dr. Johney Maine, however returned to the emergency department last evening with fever and decreased output from her ileostomy. CT demonstrated an indeterminate fluid collection within the midline of the lower abdomen / pelvis and as such, request made for placement of a CT-guided drainage catheter placement for infection source control purposes. EXAM: CT IMAGE GUIDED DRAINAGE BY PERCUTANEOUS CATHETER COMPARISON:  CT abdomen pelvis - 04/17/2019 MEDICATIONS: The patient is currently admitted to the hospital and receiving intravenous antibiotics. The antibiotics were administered within an appropriate time frame prior to the initiation of the procedure. ANESTHESIA/SEDATION: Moderate (conscious) sedation was employed during this procedure. A total of Versed 3 mg and Fentanyl 100 mcg was administered intravenously. Moderate Sedation Time: 17 minutes. The patient's level of consciousness and vital signs were monitored continuously by radiology nursing throughout the procedure under my direct supervision. CONTRAST:  None COMPLICATIONS: None immediate. PROCEDURE: Informed written consent was obtained from the patient after a discussion of the risks, benefits and alternatives to treatment. The patient was placed supine  on the CT gantry and a pre procedural CT was performed re-demonstrating the known abscess/fluid collection within the midline of the lower abdomen and pelvis with dominant component measuring approximately 7.4 x 6.0 cm (image 21, series 2). The procedure was planned. A timeout was performed prior to the initiation of the procedure. The skin overlying the ventral aspect the right lower abdomen/pelvis was prepped and draped in the usual sterile fashion. The overlying soft tissues were anesthetized with 1% lidocaine with epinephrine. Appropriate trajectory was planned with the use of a 22 gauge spinal needle. An 18 gauge trocar needle was advanced into the abscess/fluid collection and a short Amplatz super stiff wire was coiled within the collection. Appropriate positioning was confirmed with a limited CT scan. The tract was serially dilated allowing placement of a 10 [place] all-purpose drainage catheter. Appropriate positioning was confirmed with a limited postprocedural CT scan. Approximately 80 cc of dark red fluid fluid was aspirated. The tube was connected to a JP bulb and sutured in place. A dressing was placed. The patient tolerated the procedure well without immediate post procedural complication. IMPRESSION: Successful CT guided placement of a 10 [place] all purpose drain catheter into the fluid collection within the midline of the lower abdomen/pelvis  with aspiration of 80 cc of dark red fluid. Samples were sent to the laboratory as requested by the ordering clinical team. Electronically Signed   By: Sandi Mariscal M.D.   On: 04/16/2017 12:58    Labs:  CBC:  Recent Labs  04/17/17 0529 04/18/17 0427 04/19/17 0445 04/20/17 0510  WBC 11.8* 10.3 28.4* 24.8*  HGB 6.5* 7.9* 8.3* 7.7*  HCT 19.9* 24.4* 25.3* 24.0*  PLT 436* 402* 439* 417*    COAGS:  Recent Labs  04/16/17 0047  INR 1.21    BMP:  Recent Labs  04/17/17 0529 04/18/17 0427 04/19/17 0445 04/20/17 0510  NA 136 137 137 138  K  3.6 3.7 4.1 4.4  CL 104 103 103 104  CO2 25 24 26 26   GLUCOSE 169* 153* 156* 156*  BUN 10 13 13 13   CALCIUM 7.8* 8.0* 8.0* 8.1*  CREATININE 0.90 0.79 0.82 0.80  GFRNONAA >60 >60 >60 >60  GFRAA >60 >60 >60 >60    LIVER FUNCTION TESTS:  Recent Labs  04/16/17 0047 04/17/17 0529 04/18/17 0427 04/20/17 0510  BILITOT 0.6 0.3 0.4 0.4  AST 14* 14* 13* 13*  ALT 13* 12* 11* 10*  ALKPHOS 265* 202* 176* 177*  PROT 6.3* 5.5* 6.0* 5.8*  ALBUMIN 2.0* 1.7* 1.8* 1.8*    Assessment and Plan: S/p LAR secondary to endom./ovarian ca recurrence  approx 2 weeks ago; s/p drainage of lower abd/pelvic fluid collection 9/15; fluid creat at time 12, c/w urinoma; color changes noted from dark red to yellow and now turbid, brown fluid; cx- enterococcus/serratia; AF; WBC 24.8(25.4), hgb 7.7(8.3), creat nl; alb 1.8; cont current tx; antbx per pharm/ID; rec f/u CT within week of drain placement; may also need drain injection before considering removal; other plans as per [facility]/urology   Electronically Signed: D. Rowe Robert, PA-C 04/20/2017, 10:44 AM   I spent a total of 15 minutes at the the patient's bedside AND on the patient's hospital floor or unit, greater than 50% of which was counseling/coordinating care for pelvic fluid collection drain    Patient ID: [name], female   DOB: [birth year], 61 y.o.   MRN: [id number]

## 2017-04-20 NOTE — Progress Notes (Signed)
Subjective/Chief Complaint:  1 - Bladder Injury / Right Ureteral Stricture -s/p robotic RIGHT ureteral reimplant with psoas hitch + HM Flap 04/01/17 at time of colostomy take down / adhesiolysis, small bowel resection, loop iliostomy for right distal stricture / bladder injury sustained at pelvic resection for advanced endometrial cancer 2016. Drain removed 9/6 as Cr same as serum and output scant.   2 - Pelvic Urinoma / Ileus - readmission 04/16/17 with dehydration, low iliostomy output and new anterior pelvic fluid collection. Drained and Cr 12.1 (c/w urinoma), delayed contast series show good renal excretion, bladder filling, but no direct communication with urinoma suggesting no active leak.   Today "[name]" is stable. Urinoma drain remains moderate output. Improved iliostomy output.    Objective: Vital signs in last 24 hours: Temp:  [98.2 F (36.8 C)-98.4 F (36.9 C)] 98.2 F (36.8 C) (09/19 0522) Pulse Rate:  [60-65] 60 (09/19 0522) Resp:  [18] 18 (09/19 0522) BP: (107-120)/(54-63) 113/54 (09/19 0522) SpO2:  [99 %] 99 % (09/19 0522) Last BM Date: 04/19/17  Intake/Output from previous day: 09/18 0701 - 09/19 0700 In: 3616.7 [P.O.:780; I.V.:2281.7; IV Piggyback:550] Out: 4496 [Urine:2700; Drains:745; PRFFM:3846] Intake/Output this shift: Total I/O In: 1740 [P.O.:480; I.V.:1060; IV Piggyback:200] Out: 3595 [Urine:2300; Drains:195; Stool:1100]   General appearance: alert, cooperative and some weight loss noted.  Eyes: negative Nose: Nares normal. Septum midline. Mucosa normal. No drainage or sinus tenderness. Throat: lips, mucosa, and tongue normal; teeth and gums normal Neck: supple, symmetrical, trachea midline Resp: non-labored on minimal [place] O2.  Cardio: Nl rate at present.  GI: soft, non-tender; bowel sounds normal; no masses,  no organomegaly and RLQ iliostomy patent. some blanchign erythema upper chest that is improving Recent surgiclal scars w/o drainage.  Pelvic:  external genitalia normal and foley in place with yellow urine. Pelvic urinoma drain in place wtih some simple non-foul output.  Extremities: extremities normal, atraumatic, no cyanosis or edema Skin: Skin color, texture, turgor normal. No rashes or lesions Neurologic: Grossly normal Lab Results:   Recent Labs  04/19/17 0445 04/20/17 0510  WBC 28.4* 24.8*  HGB 8.3* 7.7*  HCT 25.3* 24.0*  PLT 439* 417*   BMET  Recent Labs  04/19/17 0445 04/20/17 0510  NA 137 138  K 4.1 4.4  CL 103 104  CO2 26 26  GLUCOSE 156* 156*  BUN 13 13  CREATININE 0.82 0.80  CALCIUM 8.0* 8.1*   PT/INR No results for input(s): LABPROT, INR in the last 72 hours. ABG No results for input(s): PHART, HCO3 in the last 72 hours.  Invalid input(s): PCO2, PO2  Studies/Results: No results found.  Anti-infectives: Anti-infectives    Start     Dose/Rate Route Frequency Ordered Stop   04/18/17 2200  aztreonam (AZACTAM) 2 GM IVPB     2 g 100 mL/hr over 30 Minutes Intravenous Every 8 hours 04/18/17 1913     04/18/17 1000  fluconazole (DIFLUCAN) IVPB 200 mg  Status:  Discontinued     200 mg 100 mL/hr over 60 Minutes Intravenous Every 24 hours 04/18/17 0836 04/18/17 1448   04/16/17 2200  vancomycin (VANCOCIN) IVPB 1000 mg/200 mL premix  Status:  Discontinued     1,000 mg 200 mL/hr over 60 Minutes Intravenous Every 12 hours 04/16/17 0917 04/16/17 1154   04/16/17 2200  vancomycin (VANCOCIN) IVPB 750 mg/150 ml premix     750 mg 150 mL/hr over 60 Minutes Intravenous Every 12 hours 04/16/17 1154     04/16/17 1800  vancomycin (  VANCOCIN) IVPB 750 mg/150 ml premix  Status:  Discontinued     750 mg 150 mL/hr over 60 Minutes Intravenous Every 12 hours 04/16/17 0359 04/16/17 0652   04/16/17 1245  metroNIDAZOLE (FLAGYL) IVPB 500 mg  Status:  Discontinued     500 mg 100 mL/hr over 60 Minutes Intravenous Every 8 hours 04/16/17 1244 04/16/17 1258   04/16/17 0930  vancomycin (VANCOCIN) IVPB 1000 mg/200 mL premix      1,000 mg 200 mL/hr over 60 Minutes Intravenous  Once 04/16/17 0911 04/16/17 1043   04/16/17 0600  aztreonam (AZACTAM) injection 2 g  Status:  Discontinued     2 g Intramuscular Every 8 hours 04/16/17 0354 04/16/17 0355   04/16/17 0400  metroNIDAZOLE (FLAGYL) IVPB 500 mg  Status:  Discontinued     500 mg 100 mL/hr over 60 Minutes Intravenous Every 8 hours 04/16/17 0354 04/18/17 0815   04/16/17 0400  vancomycin (VANCOCIN) IVPB 1000 mg/200 mL premix  Status:  Discontinued     1,000 mg 200 mL/hr over 60 Minutes Intravenous  Once 04/16/17 0354 04/16/17 0911   04/16/17 0400  aztreonam (AZACTAM) 2 g in dextrose 5 % 50 mL IVPB  Status:  Discontinued     2 g 100 mL/hr over 30 Minutes Intravenous Every 8 hours 04/16/17 0356 04/18/17 1913   04/16/17 0345  gentamicin (GARAMYCIN) 240 mg in dextrose 5 % 50 mL IVPB  Status:  Discontinued     2.5 mg/kg  96.3 kg 112 mL/hr over 30 Minutes Intravenous Every 12 hours 04/16/17 0342 04/16/17 0354      Assessment/Plan:  1 - Bladder Injury / Right Ureteral Stricture -no further intervention this admission. Current stent and foley to remain.   2 - Pelvic Urinoma / Ileus - suspect small volume urine leak that has since resolved given no contrast extra into collection. Agree with keep current dain until output nil, recheck drain Cr, and then remove.  If output does not decrease, then take off suction x few days and keep above level of foley.   I will be out of town for next several days, but Dr. Jess Barters and on -call partners have received update on plan.   [facility], Marisa Hage 04/20/2017

## 2017-04-20 NOTE — Consult Note (Signed)
[facility]    Date of Admission:  04/15/2017           Day 5 vancomycin        Day 5 aztreonam       Reason for Consult: Pelvic abscess and multiple antibiotic allergies    Referring Provider: Dr. Michael [name]  Assessment: [name] has a postoperative pelvic abscess growing Enterococcus and Serratia. Her isolate*sensitive to most antibiotics tested but her extensive list of antibiotic allergies precludes use of penicillins, cephalosporins, and sulfonamides. Ciprofloxacin is also on her allergy list but no reaction is listed and she cannot recall what happened when she took it. She does say that she has taken levofloxacin on many occasions and tolerates it well. For now I would continue vancomycin and aztreonam. We do have the option of switching to oral linezolid and levofloxacin upon discharge. Optimal duration of therapy will depend on drain output and follow-up CT scans.  Plan: 1. 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   Chronic neutropenia (HCC)   DM type 2 (diabetes mellitus, type 2) (HCC)   Benign essential HTN   History of ovarian & endometrial cancer   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 s/p resection/partial vaginectomy/ LAR/colostomy 11/19/2014   Morbid obesity (HCC)   Pancytopenia, acquired ([place])   Chronic anemia   Breast cancer of upper-inner quadrant of left female breast ([place])   Ureteral stricture, right, s/p resection & bladder hitch reimplantation 04/01/2017   Hot flashes related to aromatase inhibitor therapy   Postoperative anemia   Ileus (HCC)   Protein-calorie malnutrition, moderate ([place])   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 anastrozole  1 mg Oral Daily  . aspirin EC  81 mg Oral Daily  . [START ON 04/24/2017] cholecalciferol  10,000  Units Oral Weekly  . enoxaparin (LOVENOX) injection  40 mg Subcutaneous Q24H  . hydrocortisone cream  1 application Topical BID  . insulin aspart  0-15 Units Subcutaneous Q4H  . [START ON 04/21/2017] levothyroxine  150 mcg Oral QAC breakfast  . linagliptin  5 mg Oral Daily  . lip balm  1 application Topical BID  . loratadine  10 mg Oral Daily  . prenatal multivitamin  1 tablet Oral Q1200  . protein supplement shake  11 oz Oral BID BM  . rosuvastatin  10 mg Oral QPM  . sodium chloride flush  5 mL Intravenous Q8H  . Tbo-Filgrastim  480 mcg Subcutaneous 6 days    HPI: [name] is a 61 y.o. 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 She underwent TAH-BSO followed by chemotherapy. She developed a pelvic recurrence in 2016 and underwent partial colectomy and colostomy formation followed by radiation therapy. 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 On 04/01/2017 she underwent extensive surgery with lysis of adhesions, colostomy takedown, diverting loop ileostomy formation, reimplantation of her right ureter in the dome of her bladder and stent replacement. She also has a history of breast cancer diagnosed last year. She is on Arimidex.  Following discharge she developed progressive abdominal bloating, pain, nausea, vomiting and foul-smelling rectal drainage. She also developed thrush and started taking fluconazole. She then developed a slightly pruritic rash on her chest and abdomen. She  began having low-grade fevers and was readmitted on 04/16/2017 with a temperature of 102.3. CT scan showed 2 pelvic fluid collections, some mesial enteric air and edema and left hydroureteronephrosis. She underwent drain placement in the pelvic fluid collections yielding a large amount of bloody fluid. Drain cultures have grown Enterococcus faecalis and Serratia marcescens. Because of her multiple antibiotic allergies she is being  treated with vancomycin and aztreonam. She has defervesced and is feeling a little bit better. She notes that her rectal drainage has improved some.   Review of Systems: Review of Systems  Constitutional: Positive for chills, fever and malaise/fatigue. Negative for diaphoresis.  HENT: Negative for sore throat.   Respiratory: Negative for cough, sputum production and shortness of breath.   Cardiovascular: Negative for chest pain.  Gastrointestinal: Positive for abdominal pain, nausea and vomiting.  Skin: Positive for itching and rash.  Neurological: Positive for weakness. Negative for headaches.    Past Medical History:  Diagnosis Date  . Acute bacterial bronchitis 06/04/2015  . Anemia in neoplastic disease   . Benign essential HTN   . Breast cancer of upper-inner quadrant of left female breast ([place])   . 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 Cancer of corpus uteri, except isthmus [facility]) dx 10-15-2004 oncologist-- dr Denman [name] and dr Alvy Bimler     dx endometrial and ovarian cancer s/p  chemotheapy and surgery:  recurrent 11-19-2014 w/ radiation 01-29-2015 to 03-10-2015  . Cellulitis of left abdominal wall near colostomy 12/03/2014  . Chronic idiopathic neutropenia (HCC)    presumed related to chemotherapy March 2006--- followed by dr Alvy Bimler   . Complication of anesthesia    PONV  . Diabetic retinopathy, background ([place])   . DM type 2 (diabetes mellitus, type 2) ([place])    monitored by dr Legrand Como altheimer  . Dysuria   . GERD (gastroesophageal reflux disease)    takes omeprazole  . Hiatal hernia   . History of bronchitis   . History of gastric polyp    2014  duodenum  . History of radiation therapy    01-29-2015 to 03-10-2015  pelvis 50.4Gy  . Hypothyroidism    monitored by dr Legrand Como altheimer  .  Malignant neoplasm of corpus uteri, except isthmus ([place]) 05/10/2012  . Malignant neoplasm of ovary ([place]) 05/10/2012  . Mixed dyslipidemia   . Multiple thyroid nodules    Managed by Dr. Harlow Asa  . PONV (postoperative nausea and vomiting)    "scopolamine patch works for me"  . Radiation-induced dermatitis    contact dermatitis , radiation completed, rash only on ankles now.  . Reactive thrombocytosis 11/29/2014  . Right flank pain   . S/P colostomy ([place])   . Seasonal allergies   . Shoulder stiffness    bilateral   . Ureteral stricture, right UROLOGIT-  DR [facility]   CHRONIC--  TREATMENT URETERAL STENT  . Vitamin D deficiency   . Wears glasses     Social History  Substance Use Topics  . Smoking status: Never Smoker  . Smokeless tobacco: Never Used  . Alcohol use Yes     Comment: rare social    Family History  Problem Relation Age of Onset  . Cancer Mother 30       stomach ca  . Hypertension Mother   . Cancer Father 38  prostate ca  . Diabetes Father   . Heart disease Father        CABG  . Breast cancer Maternal Aunt        dx in her 80s  . Lymphoma Paternal Aunt   . Brain cancer Paternal Grandfather   . Ovarian cancer Other   . Diabetes Sister   . Hypertension Brother y-10  . Heart disease Brother        CABG  . Diabetes Brother    Allergies  Allergen Reactions  . Penicillins Swelling    Facial swelling Has patient had a PCN reaction causing immediate rash, facial/tongue/throat swelling, SOB or lightheadedness with hypotension: Yes Has patient had a PCN reaction causing severe rash involving mucus membranes or skin necrosis: Yes Has patient had a PCN reaction that required hospitalization No Has patient had a PCN reaction occurring within the last 10 years: No If all of the above answers are "NO", then may proceed with Cephalosporin use.   [name] Kitchen Ultram [Tramadol] Hives  . Adhesive [Tape]     blisters  . Cefaclor Rash    Ceclor  . Erythromycin     Gastritis,  abd cramps  . Trimethoprim Rash  . Ciprofloxacin Other (See Comments)    Unknown On Dr notes   . Oxycodone     " I just feel weird"  . Pectin Rash    Pectin ring for stoma  . Sulfa Antibiotics Rash    OBJECTIVE: Blood pressure (!) 124/52, pulse 68, temperature 98.6 F (37 C), temperature source Oral, resp. rate 17, height 5' 2.5" (1.588 m), weight 212 lb 3.2 oz (96.3 kg), SpO2 99 %.  Physical Exam  Constitutional: She is oriented to person, place, and time.  She is pale and appears weak. She is in relatively good spirits and appears comfortable resting quietly in bed.  HENT:  Mouth/Throat: No oropharyngeal exudate.  Eyes: Conjunctivae are normal.  Neck: Neck supple.  Cardiovascular: Normal rate and regular rhythm.   Murmur heard. Pulmonary/Chest: Effort normal and breath sounds normal. She has no wheezes. She has no rales.  Healed incision in her left breast.  Abdominal: Soft. She exhibits no distension. There is tenderness.  She has a right-sided ileostomy and a right lower quadrant drain. There is serosanguineous fluid in the drain bulb. She has a healing left lower quadrant incision.  Musculoskeletal: Normal range of motion. She exhibits no edema or tenderness.  Neurological: She is alert and oriented to person, place, and time.  Skin:  She has a fading erythematous rash on her anterior chest and abdomen. It has not extended out beyond the inked borders placed on admission.    Lab Results Lab Results  Component Value Date   WBC 24.8 (H) 04/20/2017   HGB 7.7 (L) 04/20/2017   HCT 24.0 (L) 04/20/2017   MCV 87.3 04/20/2017   PLT 417 (H) 04/20/2017    Lab Results  Component Value Date   CREATININE 0.80 04/20/2017   BUN 13 04/20/2017   NA 138 04/20/2017   K 4.4 04/20/2017   CL 104 04/20/2017   CO2 26 04/20/2017    Lab Results  Component Value Date   ALT 10 (L) 04/20/2017   AST 13 (L) 04/20/2017   ALKPHOS 177 (H) 04/20/2017   BILITOT 0.4 04/20/2017      Microbiology: Recent Results (from the past 240 hour(s))  Culture, blood (Routine x 2)     Status: None (Preliminary result)   Collection Time: 04/16/17 12:47  AM  Result Value Ref Range Status   Specimen Description BLOOD PICC LINE  Final   Special Requests   Final    BOTTLES DRAWN AEROBIC AND ANAEROBIC Blood Culture adequate volume   Culture   Final    NO GROWTH 4 DAYS Performed at [facility], [address], [place], [place] [address]    Report Status PENDING  Incomplete  Culture, blood (Routine x 2)     Status: None (Preliminary result)   Collection Time: 04/16/17  1:07 AM  Result Value Ref Range Status   Specimen Description BLOOD RIGHT ANTECUBITAL  Final   Special Requests   Final    BOTTLES DRAWN AEROBIC AND ANAEROBIC Blood Culture adequate volume   Culture   Final    NO GROWTH 4 DAYS Performed at [facility], [place] [address], [place], [place] [address]    Report Status PENDING  Incomplete  Aerobic/Anaerobic Culture (surgical/deep wound)     Status: None (Preliminary result)   Collection Time: 04/16/17 12:16 PM  Result Value Ref Range Status   Specimen Description WOUND  Final   Special Requests Normal  Final   Gram Stain   Final    ABUNDANT WBC PRESENT, PREDOMINANTLY PMN FEW GRAM POSITIVE COCCI IN PAIRS IN CHAINS RARE GRAM NEGATIVE RODS Performed at [facility], [place] [address] [place], [place] [address]    Culture   Final    MODERATE ENTEROCOCCUS FAECALIS MODERATE SERRATIA MARCESCENS NO ANAEROBES ISOLATED; CULTURE IN PROGRESS FOR 5 DAYS    Report Status PENDING  Incomplete   Organism ID, Bacteria ENTEROCOCCUS FAECALIS  Final   Organism ID, Bacteria SERRATIA MARCESCENS  Final      Susceptibility   Enterococcus faecalis - MIC*    AMPICILLIN <=2 SENSITIVE Sensitive     VANCOMYCIN 2 SENSITIVE Sensitive     GENTAMICIN SYNERGY SENSITIVE Sensitive     * MODERATE ENTEROCOCCUS FAECALIS   Serratia marcescens - MIC*    CEFAZOLIN >=64 RESISTANT  Resistant     CEFEPIME <=1 SENSITIVE Sensitive     CEFTAZIDIME <=1 SENSITIVE Sensitive     CEFTRIAXONE <=1 SENSITIVE Sensitive     CIPROFLOXACIN <=0.25 SENSITIVE Sensitive     GENTAMICIN <=1 SENSITIVE Sensitive     TRIMETH/SULFA <=20 SENSITIVE Sensitive     * MODERATE SERRATIA MARCESCENS    Michel Bickers, [facility] [phone] pager   [phone] cell 04/20/2017, 4:55 PM

## 2017-04-20 NOTE — Progress Notes (Signed)
[address] [place], [place] [address] Phone: [phone]  FAX: [phone]      [name] [id number] [birth year]  CARE TEAM:  PCP: Ann Held, DO  Outpatient Care Team: Patient Care Team: Carollee Herter, Alferd Apa, DO as PCP - General (Family Medicine) Heath Lark, MD as Consulting Physician (Hematology and Oncology) Everitt Amber, MD as Consulting Physician (Obstetrics and Gynecology) Michael [name], MD as Consulting Physician (General Surgery) Alexis Frock, MD as Consulting Physician (Urology) Altheimer, Legrand Como, MD as Consulting Physician (Endocrinology) Katy Apo, MD as Consulting Physician (Ophthalmology) Stark Klein, MD as Consulting Physician (General Surgery) Irene Limbo, MD as Consulting Physician (Plastic Surgery)  Inpatient Treatment Team: Treatment Team: Attending Provider: Michael [name], MD; Consulting Physician: Dorthy Cooler Radiology, MD; Registered Nurse: Nila Nephew, RN; Technician: Leda Quail, NT; Consulting Physician: Alexis Frock, MD; Case Manager: Guadalupe Maple, RN; Registered Nurse: Oleta Mouse, RN   Problem List:   Principal Problem:   Ileus [facility]) Active Problems:   Chronic neutropenia ([place])   DM type 2 (diabetes mellitus, type 2) ([place])   Benign essential HTN   History of ovarian & endometrial cancer   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 s/p resection/partial vaginectomy/ LAR/colostomy 11/19/2014   Morbid obesity ([place])   Pancytopenia, acquired ([place])   Chronic anemia   Breast cancer of upper-inner quadrant of left female breast ([place])   Ureteral stricture, right, s/p resection & bladder hitch reimplantation 04/01/2017   Hot flashes related to aromatase inhibitor therapy   Pelvic cancer s/p colostomy takedown/loop ileostomy diversion 04/01/2017   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   Postoperative anemia   Pelvic fluid  collection   Protein-calorie malnutrition, moderate (HCC)   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  More than 50% of that time was spent in counseling.  _0 @  04/20/2017    Subjective: (Chief complaint)  NGT in place, bothering patient, reports nasal irritation. Tolerated clear liquids w/ NGT clamping trial. Feels she is doing better today.  Walked 3 laps this morning - ambulating well. Flatus and stool in ostomy bag.   Nausea is well-controlled w/ antiemetics at this time. Rash margins on upper abdomen and chest improving. Abdominal pain is improving.     Objective:  Vital signs:  Vitals:   04/19/17 0527 04/19/17 1400 04/19/17 2127 04/20/17 0522  BP: (!) 112/47 (!) 107/56 120/63 (!) 113/54  Pulse: 70 65 65 60  Resp: _1 Temp: 98.8 F (37.1 C) 98.4 F (36.9 C) 98.4 F (36.9 C) 98.2 F (36.8 C)  TempSrc: Oral Oral Oral Oral  SpO2: 99% 99% 99% 99%  Weight:      Height:        Last BM Date: 04/19/17  Intake/Output   Yesterday:  09/18 0701 - 09/19 0700 In: 4172.7 [P.O.:780; I.V.:2787.7; IV Piggyback:600] Out: 8309 [Urine:2700; Drains:745; Stool:2025] This shift:  No intake/output data recorded.  Bowel function:  Flatus: YES   BM:  YES  Drain: Serosanguinous   Physical Exam:  General: Pt awake/alert/oriented x4 in no acute distress Eyes: PERRL, normal EOM.  Sclera clear.  No icterus Neuro: CN  II-XII intact w/o focal sensory/motor deficits. Lymph: No head/neck/groin lymphadenopathy Psych:  No delerium/psychosis/paranoia HENT: Normocephalic, Mucus membranes moist.  No thrush Neck: Supple, No tracheal deviation Chest: No chest wall pain w good excursion CV:  Pulses intact.  Regular rhythm MS: Normal AROM mjr joints.  No obvious deformity  Abdomen: Soft.  Mildy distended.  Tenderness at incisions and ostomy site  No evidence of peritonitis.  No incarcerated hernias.  Incisions are dry and intact.  Ostomy and drain are intact.  Ext:   No deformity.  No mjr edema.  No cyanosis Skin: No petechiae / purpura  Results:   Labs: Results for orders placed or performed during the hospital encounter of 04/15/17 (from the past 48 hour(s))  Creatinine, fluid (JP Drainage)     Status: None   Collection Time: 04/18/17 10:29 AM  Result Value Ref Range   Creat, Fluid 12.1 mg/dL    Comment: (NOTE) No normal range established for this test Results should be evaluated in conjunction with serum values Performed at [facility] [address], [place], [place] [address]    Fluid Type-FCRE JP DRAINAGE   Glucose, capillary     Status: Abnormal   Collection Time: 04/18/17 12:01 PM  Result Value Ref Range   Glucose-Capillary 141 (H) 65 - 99 mg/dL  Glucose, capillary     Status: Abnormal   Collection Time: 04/18/17  4:10 PM  Result Value Ref Range   Glucose-Capillary 148 (H) 65 - 99 mg/dL  Glucose, capillary     Status: Abnormal   Collection Time: 04/18/17  8:02 PM  Result Value Ref Range   Glucose-Capillary 117 (H) 65 - 99 mg/dL  Glucose, capillary     Status: Abnormal   Collection Time: 04/18/17 11:40 PM  Result Value Ref Range   Glucose-Capillary 166 (H) 65 - 99 mg/dL  Glucose, capillary     Status: Abnormal   Collection Time: 04/19/17  3:50 AM  Result Value Ref Range   Glucose-Capillary 142 (H) 65 - 99 mg/dL  CBC     Status: Abnormal   Collection Time: 04/19/17  4:45 AM   Result Value Ref Range   WBC 28.4 (H) 4.0 - 10.5 K/uL   RBC 2.93 (L) 3.87 - 5.11 MIL/uL   Hemoglobin 8.3 (L) 12.0 - 15.0 g/dL   HCT 25.3 (L) 36.0 - 46.0 %   MCV 86.3 78.0 - 100.0 fL   MCH 28.3 26.0 - 34.0 pg   MCHC 32.8 30.0 - 36.0 g/dL   RDW 18.6 (H) 11.5 - 15.5 %   Platelets 439 (H) 150 - 400 K/uL  Basic metabolic panel     Status: Abnormal   Collection Time: 04/19/17  4:45 AM  Result Value Ref Range   Sodium 137 135 - 145 mmol/L   Potassium 4.1 3.5 - 5.1 mmol/L   Chloride 103 101 - 111 mmol/L   CO2 26 22 - 32 mmol/L   Glucose, Bld 156 (H) 65 - 99 mg/dL   BUN 13 6 - 20 mg/dL   Creatinine, Ser 0.82 0.44 - 1.00 mg/dL   Calcium 8.0 (L) 8.9 - 10.3 mg/dL   GFR calc non Af Amer >60 >60 mL/min   GFR calc Af Amer >60 >60 mL/min    Comment: (NOTE) The eGFR has been calculated using  the CKD EPI equation. This calculation has not been validated in all clinical situations. eGFR's persistently <60 mL/min signify possible Chronic Kidney Disease.    Anion gap 8 5 - 15  Magnesium     Status: Abnormal   Collection Time: 04/19/17  4:45 AM  Result Value Ref Range   Magnesium 1.6 (L) 1.7 - 2.4 mg/dL  Phosphorus     Status: None   Collection Time: 04/19/17  4:45 AM  Result Value Ref Range   Phosphorus 3.6 2.5 - 4.6 mg/dL  Glucose, capillary     Status: Abnormal   Collection Time: 04/19/17  7:54 AM  Result Value Ref Range   Glucose-Capillary 146 (H) 65 - 99 mg/dL  Glucose, capillary     Status: Abnormal   Collection Time: 04/19/17 12:25 PM  Result Value Ref Range   Glucose-Capillary 175 (H) 65 - 99 mg/dL  Glucose, capillary     Status: Abnormal   Collection Time: 04/19/17  4:27 PM  Result Value Ref Range   Glucose-Capillary 132 (H) 65 - 99 mg/dL  Vancomycin, trough     Status: None   Collection Time: 04/19/17 10:31 PM  Result Value Ref Range   Vancomycin Tr 16 15 - 20 ug/mL  Glucose, capillary     Status: Abnormal   Collection Time: 04/19/17 11:45 PM  Result Value Ref Range    Glucose-Capillary 122 (H) 65 - 99 mg/dL  Glucose, capillary     Status: Abnormal   Collection Time: 04/20/17  3:50 AM  Result Value Ref Range   Glucose-Capillary 159 (H) 65 - 99 mg/dL  CBC     Status: Abnormal   Collection Time: 04/20/17  5:10 AM  Result Value Ref Range   WBC 24.8 (H) 4.0 - 10.5 K/uL   RBC 2.75 (L) 3.87 - 5.11 MIL/uL   Hemoglobin 7.7 (L) 12.0 - 15.0 g/dL   HCT 24.0 (L) 36.0 - 46.0 %   MCV 87.3 78.0 - 100.0 fL   MCH 28.0 26.0 - 34.0 pg   MCHC 32.1 30.0 - 36.0 g/dL   RDW 18.5 (H) 11.5 - 15.5 %   Platelets 417 (H) 150 - 400 K/uL  Comprehensive metabolic panel     Status: Abnormal   Collection Time: 04/20/17  5:10 AM  Result Value Ref Range   Sodium 138 135 - 145 mmol/L   Potassium 4.4 3.5 - 5.1 mmol/L   Chloride 104 101 - 111 mmol/L   CO2 26 22 - 32 mmol/L   Glucose, Bld 156 (H) 65 - 99 mg/dL   BUN 13 6 - 20 mg/dL   Creatinine, Ser 0.80 0.44 - 1.00 mg/dL   Calcium 8.1 (L) 8.9 - 10.3 mg/dL   Total Protein 5.8 (L) 6.5 - 8.1 g/dL   Albumin 1.8 (L) 3.5 - 5.0 g/dL   AST 13 (L) 15 - 41 U/L   ALT 10 (L) 14 - 54 U/L   Alkaline Phosphatase 177 (H) 38 - 126 U/L   Total Bilirubin 0.4 0.3 - 1.2 mg/dL   GFR calc non Af Amer >60 >60 mL/min   GFR calc Af Amer >60 >60 mL/min    Comment: (NOTE) The eGFR has been calculated using the CKD EPI equation. This calculation has not been validated in all clinical situations. eGFR's persistently <60 mL/min signify possible Chronic Kidney Disease.    Anion gap 8 5 - 15  Magnesium     Status: None   Collection Time: 04/20/17  5:10 AM  Result Value Ref Range   Magnesium 1.8 1.7 - 2.4 mg/dL  Phosphorus     Status: None   Collection Time: 04/20/17  5:10 AM  Result Value Ref Range   Phosphorus 3.9 2.5 - 4.6 mg/dL  Glucose, capillary     Status: Abnormal   Collection Time: 04/20/17  7:31 AM  Result Value Ref Range   Glucose-Capillary 140 (H) 65 - 99 mg/dL    Imaging / Studies: No results found.  Medications / Allergies: per  chart  Antibiotics: Anti-infectives    Start     Dose/Rate Route Frequency Ordered Stop   04/18/17 2200  aztreonam (AZACTAM) 2 GM IVPB     2 g 100 mL/hr over 30 Minutes Intravenous Every 8 hours 04/18/17 1913     04/18/17 1000  fluconazole (DIFLUCAN) IVPB 200 mg  Status:  Discontinued     200 mg 100 mL/hr over 60 Minutes Intravenous Every 24 hours 04/18/17 0836 04/18/17 1448   04/16/17 2200  vancomycin (VANCOCIN) IVPB 1000 mg/200 mL premix  Status:  Discontinued     1,000 mg 200 mL/hr over 60 Minutes Intravenous Every 12 hours 04/16/17 0917 04/16/17 1154   04/16/17 2200  vancomycin (VANCOCIN) IVPB 750 mg/150 ml premix     750 mg 150 mL/hr over 60 Minutes Intravenous Every 12 hours 04/16/17 1154     04/16/17 1800  vancomycin (VANCOCIN) IVPB 750 mg/150 ml premix  Status:  Discontinued     750 mg 150 mL/hr over 60 Minutes Intravenous Every 12 hours 04/16/17 0359 04/16/17 0652   04/16/17 1245  metroNIDAZOLE (FLAGYL) IVPB 500 mg  Status:  Discontinued     500 mg 100 mL/hr over 60 Minutes Intravenous Every 8 hours 04/16/17 1244 04/16/17 1258   04/16/17 0930  vancomycin (VANCOCIN) IVPB 1000 mg/200 mL premix     1,000 mg 200 mL/hr over 60 Minutes Intravenous  Once 04/16/17 0911 04/16/17 1043   04/16/17 0600  aztreonam (AZACTAM) injection 2 g  Status:  Discontinued     2 g Intramuscular Every 8 hours 04/16/17 0354 04/16/17 0355   04/16/17 0400  metroNIDAZOLE (FLAGYL) IVPB 500 mg  Status:  Discontinued     500 mg 100 mL/hr over 60 Minutes Intravenous Every 8 hours 04/16/17 0354 04/18/17 0815   04/16/17 0400  vancomycin (VANCOCIN) IVPB 1000 mg/200 mL premix  Status:  Discontinued     1,000 mg 200 mL/hr over 60 Minutes Intravenous  Once 04/16/17 0354 04/16/17 0911   04/16/17 0400  aztreonam (AZACTAM) 2 g in dextrose 5 % 50 mL IVPB  Status:  Discontinued     2 g 100 mL/hr over 30 Minutes Intravenous Every 8 hours 04/16/17 0356 04/18/17 1913   04/16/17 0345  gentamicin (GARAMYCIN) 240 mg in  dextrose 5 % 50 mL IVPB  Status:  Discontinued     2.5 mg/kg  96.3 kg 112 mL/hr over 30 Minutes Intravenous Every 12 hours 04/16/17 0342 04/16/17 0354        Note: Portions of this report may have been transcribed using voice recognition software. Every effort was made to ensure accuracy; however, inadvertent computerized transcription errors may be present.   Any transcriptional errors that result from this process are unintentional.     [place] Hall-Potvin, PA-S  _0 @  04/20/2017

## 2017-04-21 LAB — CBC
HCT: 24.4 % — ABNORMAL LOW (ref 36.0–46.0)
Hemoglobin: 7.7 g/dL — ABNORMAL LOW (ref 12.0–15.0)
MCH: 27 pg (ref 26.0–34.0)
MCHC: 31.6 g/dL (ref 30.0–36.0)
MCV: 85.6 fL (ref 78.0–100.0)
Platelets: 411 10*3/uL — ABNORMAL HIGH (ref 150–400)
RBC: 2.85 MIL/uL — ABNORMAL LOW (ref 3.87–5.11)
RDW: 18 % — AB (ref 11.5–15.5)
WBC: 25.4 10*3/uL — ABNORMAL HIGH (ref 4.0–10.5)

## 2017-04-21 LAB — CULTURE, BLOOD (ROUTINE X 2)
CULTURE: NO GROWTH
Culture: NO GROWTH
Special Requests: ADEQUATE
Special Requests: ADEQUATE

## 2017-04-21 LAB — AEROBIC/ANAEROBIC CULTURE W GRAM STAIN (SURGICAL/DEEP WOUND)

## 2017-04-21 LAB — COMPREHENSIVE METABOLIC PANEL
ALBUMIN: 1.8 g/dL — AB (ref 3.5–5.0)
ALT: 10 U/L — ABNORMAL LOW (ref 14–54)
ANION GAP: 7 (ref 5–15)
AST: 13 U/L — ABNORMAL LOW (ref 15–41)
Alkaline Phosphatase: 190 U/L — ABNORMAL HIGH (ref 38–126)
BUN: 17 mg/dL (ref 6–20)
CALCIUM: 8.1 mg/dL — AB (ref 8.9–10.3)
CHLORIDE: 103 mmol/L (ref 101–111)
CO2: 26 mmol/L (ref 22–32)
CREATININE: 0.85 mg/dL (ref 0.44–1.00)
GFR calc Af Amer: >60 mL/min (ref >60)
GFR calc non Af Amer: >60 mL/min (ref >60)
Glucose, Bld: 161 mg/dL — ABNORMAL HIGH (ref 65–99)
POTASSIUM: 4.8 mmol/L (ref 3.5–5.1)
Sodium: 136 mmol/L (ref 135–145)
TOTAL PROTEIN: 5.9 g/dL — AB (ref 6.5–8.1)
Total Bilirubin: 0.5 mg/dL (ref 0.3–1.2)

## 2017-04-21 LAB — AEROBIC/ANAEROBIC CULTURE (SURGICAL/DEEP WOUND): SPECIAL REQUESTS: NORMAL

## 2017-04-21 LAB — MAGNESIUM: MAGNESIUM: 1.5 mg/dL — AB (ref 1.7–2.4)

## 2017-04-21 LAB — GLUCOSE, CAPILLARY
GLUCOSE-CAPILLARY: 151 mg/dL — AB (ref 65–99)
Glucose-Capillary: 137 mg/dL — ABNORMAL HIGH (ref 65–99)
Glucose-Capillary: 161 mg/dL — ABNORMAL HIGH (ref 65–99)
Glucose-Capillary: 146 mg/dL — ABNORMAL HIGH (ref 65–99)
Glucose-Capillary: 98 mg/dL (ref 65–99)

## 2017-04-21 LAB — PHOSPHORUS: PHOSPHORUS: 3.8 mg/dL (ref 2.5–4.6)

## 2017-04-21 MED ORDER — CLINIMIX E/DEXTROSE (5/15) 5 % IV SOLN
INTRAVENOUS | Status: AC
  Administered 2017-04-21: 19:00:00 via INTRAVENOUS
  Filled 2017-04-21: qty 960

## 2017-04-21 MED ORDER — FAT EMULSION 20 % IV EMUL
240.0000 mL | INTRAVENOUS | Status: AC
  Administered 2017-04-21: 240 mL via INTRAVENOUS
  Filled 2017-04-21: qty 250

## 2017-04-21 MED ORDER — ENSURE SURGERY PO LIQD
237.0000 mL | Freq: Two times a day (BID) | ORAL | Status: DC
Start: 2017-04-21 — End: 2017-04-25
  Administered 2017-04-25: 237 mL via ORAL
  Filled 2017-04-21 (×10): qty 237

## 2017-04-21 MED ORDER — MAGNESIUM SULFATE 4 GM/100ML IV SOLN
4.0000 g | Freq: Once | INTRAVENOUS | Status: AC
  Administered 2017-04-21: 4 g via INTRAVENOUS
  Filled 2017-04-21: qty 100

## 2017-04-21 MED ORDER — HYDROMORPHONE HCL-NACL 0.5-0.9 MG/ML-% IV SOSY
1.0000 mg | PREFILLED_SYRINGE | INTRAVENOUS | Status: DC | PRN
  Filled 2017-04-21 (×2): qty 2

## 2017-04-21 NOTE — Progress Notes (Signed)
Patient ID: [name], female   DOB: April 19, 1956, 61 y.o.   MRN: [id number]          [facility]    Date of Admission:  04/15/2017           Day 6 vancomycin and aztreonam  [name] is afebrile but continues to have high output from her pelvic drain suggesting ongoing urine leak. There are no new culture results. I will continue vancomycin and aztreonam for now.         Michel Bickers, MD [facility] [phone] pager   [phone] cell 04/21/2017, 5:33 PM

## 2017-04-21 NOTE — Progress Notes (Signed)
Nutrition Follow-up  DOCUMENTATION CODES:   Obesity unspecified  INTERVENTION:   Recommend daily weights while patient is receiving TPN.  -TPN per Pharmacy -Continue Premier Protein BID, each supplement provides 160kcal and 30g protein.  -RD will continue to monitor  NUTRITION DIAGNOSIS:   Inadequate oral intake related to inability to eat as evidenced by NPO status.  Now on regular diet. Eating better.  GOAL:   Patient will meet greater than or equal to 90% of their needs  Progressing.  MONITOR:   PO intake, Labs, Weight trends, I & O's (TPN)  ASSESSMENT:    61 y.o. female with a history of endometrial/ovarian cancer diagnosed in 2006, status post TAH/BSO and chemotherapy.   Patient drank 1 Premier Protein yesterday. No other supplements since. Pt is currently eating 70-75% of meals she is ordering. Pt requested regular diet with MD still recommends soft diet.  Since patient is tolerating solid diet, TPN is being weaned.  Medications: Prenatal MVI daily, IV Reglan PRN, IV Zofran PRN Labs reviewed: CBGs: 137-161 Phos WNL Low Mg  Plan per Pharmacy 9/20: Magnesium repletion 4gm IVPB this am At 1800 today:  Reduce Clinimix E 5/15 to rate of 40 ml/hr.  20% fat emulsion at 66ml/hr over 12hr.  Diet Order:  .TPN (CLINIMIX-E) Adult Diet regular Room service appropriate? Yes; Fluid consistency: Thin .TPN (CLINIMIX-E) Adult  Skin:  Reviewed, no issues  Last BM:  9/20  Height:   Ht Readings from Last 1 Encounters:  04/16/17 5' 2.5" (1.588 m)    Weight:   Wt Readings from Last 1 Encounters:  04/16/17 212 lb 3.2 oz (96.3 kg)    Ideal Body Weight:  52.3 kg  BMI:  Body mass index is 38.19 kg/m.  Estimated Nutritional Needs:   Kcal:  1600-1800  Protein:  65-75g  Fluid:  1.8L/day  EDUCATION NEEDS:   Education needs addressed  Clayton Bibles, MS, RD, LDN Pager: [phone] After Hours Pager: [phone]

## 2017-04-21 NOTE — Progress Notes (Signed)
[address] [place], [place] [address] Phone: [phone]  FAX: [phone]      Laelle Bridgett [id number] Oct 01, 1955  CARE TEAM:  PCP: Ann Held, DO  Outpatient Care Team: Patient Care Team: Carollee Herter, Alferd Apa, DO as PCP - General (Family Medicine) Heath Lark, MD as Consulting Physician (Hematology and Oncology) Everitt Amber, MD as Consulting Physician (Obstetrics and Gynecology) Michael [name], MD as Consulting Physician (General Surgery) Alexis Frock, MD as Consulting Physician (Urology) Altheimer, Legrand Como, MD as Consulting Physician (Endocrinology) Katy Apo, MD as Consulting Physician (Ophthalmology) Stark Klein, MD as Consulting Physician (General Surgery) Irene Limbo, MD as Consulting Physician (Plastic Surgery)  Inpatient Treatment Team: Treatment Team: Attending Provider: Michael [name], MD; Consulting Physician: Dorthy Cooler Radiology, MD; Registered Nurse: Nila Nephew, RN; Technician: Leda Quail, NT; Consulting Physician: Alexis Frock, MD; Case Manager: Guadalupe Maple, RN; Registered Nurse: Oleta Mouse, RN; Consulting Physician: Michel Bickers, MD; Registered Nurse: Bayard Hugger, RN; Registered Nurse: Neil Crouch D, RN   Problem List:   Principal Problem:   Pelvic abscess in female Active Problems:   Chronic neutropenia ([place])   DM type 2 (diabetes mellitus, type 2) (HCC)   Benign essential HTN   History of ovarian & endometrial cancer   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 s/p resection/partial vaginectomy/ LAR/colostomy 11/19/2014   Morbid obesity (HCC)   Pancytopenia, acquired ([place])   Chronic anemia   Breast cancer of upper-inner quadrant of left female breast ([place])   Ureteral stricture, right, 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  Per ID current abx regimen is appropriate -Monitor ileostomy site for signs of further necrosis or complications -Continue to monitor Mag and hemoglobin -Continue neupogen injections -Continue Rheumatex PO -VTE prophylaxis: SCDs, etc -Ambulate as tolerated to help recovery   25 minutes spent in review, evaluation, examination, counseling, and coordination of care.  More than 50% of that time was spent in counseling.  _0 @  04/21/2017    Subjective: (Chief complaint)  Tolerated soft foods well.  Continuing to ambulate well. Flatus and stool in ostomy bag.   Nausea has been reduced w/ antiemetics at this time. Rash margins on upper abdomen and chest improving. Abdominal pain is improving. Foley was kinked, contributed to high drain output. Patient is frustrated and tearful, but consolable.   Objective:  Vital signs:  Vitals:   04/20/17 0522 04/20/17 1400 04/20/17 2148 04/21/17 0435  BP: (!) 113/54 (!) 124/52 (!) 120/50 (!) 111/52  Pulse: 60 68 67 67  Resp: _1 Temp: 98.2 F (36.8 C) 98.6 F (37 C) 98.4 F (36.9 C) 98.5 F (36.9 C)  TempSrc: Oral Oral Oral Oral  SpO2: 99% 99% 100% 98%  Weight:      Height:        Last BM Date: 04/20/17 (ileostomy)  Intake/Output   Yesterday:  09/19 0701 - 09/20 0700 In: 1471.8 [P.O.:600; I.V.:571.8; IV Piggyback:300] Out: 2263 [Urine:1400; Drains:930; Stool:800] This shift:  No intake/output data recorded.  Bowel function:  Flatus: YES   BM:  YES  Drain: Serosanguinous   Physical Exam:  General: Pt awake/alert/oriented x4  in no acute distress Eyes: PERRL, normal EOM.  Sclera clear.  No icterus Neuro: CN II-XII intact w/o focal sensory/motor deficits. Lymph: No head/neck/groin lymphadenopathy Psych:  No delerium/psychosis/paranoia HENT: Normocephalic, Mucus membranes moist.  No thrush Neck: Supple, No tracheal deviation Chest: No chest wall pain w good excursion CV:  Pulses intact.  Regular rhythm MS: Normal AROM mjr joints.  No obvious deformity  Abdomen: Soft.  Mildy distended.  No tenderness to palpation.  No evidence of peritonitis.  No incarcerated hernias.  Incisions are dry and intact.  Drain is intact.  Proximal end of ileostomy is auto-amputating - no leak and flatus and thin succus are present in bag.  Ext:   No deformity.  No mjr edema.  No cyanosis Skin: No petechiae / purpura  Results:   Labs: Results for orders placed or performed during the hospital encounter of 04/15/17 (from the past 48 hour(s))  Glucose, capillary     Status: Abnormal   Collection Time: 04/19/17 12:25 PM  Result Value Ref Range   Glucose-Capillary 175 (H) 65 - 99 mg/dL  Glucose, capillary     Status: Abnormal   Collection Time: 04/19/17  4:27 PM  Result Value Ref Range   Glucose-Capillary 132 (H) 65 - 99 mg/dL  Vancomycin, trough     Status: None   Collection Time: 04/19/17 10:31 PM  Result Value Ref Range   Vancomycin Tr 16 15 - 20 ug/mL  Glucose, capillary     Status: Abnormal   Collection Time: 04/19/17 11:45 PM  Result Value Ref Range   Glucose-Capillary 122 (H) 65 - 99 mg/dL  Glucose, capillary     Status: Abnormal   Collection Time: 04/20/17  3:50 AM  Result Value Ref Range   Glucose-Capillary 159 (H) 65 - 99 mg/dL  CBC     Status: Abnormal   Collection Time: 04/20/17  5:10 AM  Result Value Ref Range   WBC 24.8 (H) 4.0 - 10.5 K/uL   RBC 2.75 (L) 3.87 - 5.11 MIL/uL   Hemoglobin 7.7 (L) 12.0 - 15.0 g/dL   HCT 24.0 (L) 36.0 - 46.0 %   MCV 87.3 78.0 - 100.0 fL   MCH 28.0 26.0 - 34.0 pg   MCHC 32.1  30.0 - 36.0 g/dL   RDW 18.5 (H) 11.5 - 15.5 %   Platelets 417 (H) 150 - 400 K/uL  Comprehensive metabolic panel     Status: Abnormal   Collection Time: 04/20/17  5:10 AM  Result Value Ref Range   Sodium 138 135 - 145 mmol/L   Potassium 4.4 3.5 - 5.1 mmol/L   Chloride 104 101 - 111 mmol/L   CO2 26 22 - 32 mmol/L   Glucose, Bld 156 (H) 65 - 99 mg/dL   BUN 13 6 - 20 mg/dL   Creatinine, Ser 0.80 0.44 - 1.00 mg/dL   Calcium 8.1 (L) 8.9 - 10.3 mg/dL   Total Protein 5.8 (L) 6.5 - 8.1 g/dL   Albumin 1.8 (L) 3.5 - 5.0 g/dL   AST 13 (L) 15 - 41 U/L   ALT 10 (L) 14 - 54 U/L   Alkaline Phosphatase 177 (H) 38 - 126 U/L   Total Bilirubin 0.4 0.3 - 1.2 mg/dL   GFR calc non Af Amer >60 >60 mL/min   GFR calc Af Amer >60 >  60 mL/min    Comment: (NOTE) The eGFR has been calculated using the CKD EPI equation. This calculation has not been validated in all clinical situations. eGFR's persistently <60 mL/min signify possible Chronic Kidney Disease.    Anion gap 8 5 - 15  Magnesium     Status: None   Collection Time: 04/20/17  5:10 AM  Result Value Ref Range   Magnesium 1.8 1.7 - 2.4 mg/dL  Phosphorus     Status: None   Collection Time: 04/20/17  5:10 AM  Result Value Ref Range   Phosphorus 3.9 2.5 - 4.6 mg/dL  Glucose, capillary     Status: Abnormal   Collection Time: 04/20/17  7:31 AM  Result Value Ref Range   Glucose-Capillary 140 (H) 65 - 99 mg/dL  Glucose, capillary     Status: Abnormal   Collection Time: 04/20/17 12:08 PM  Result Value Ref Range   Glucose-Capillary 186 (H) 65 - 99 mg/dL  Glucose, capillary     Status: Abnormal   Collection Time: 04/20/17  4:06 PM  Result Value Ref Range   Glucose-Capillary 124 (H) 65 - 99 mg/dL  Glucose, capillary     Status: Abnormal   Collection Time: 04/20/17  7:49 PM  Result Value Ref Range   Glucose-Capillary 151 (H) 65 - 99 mg/dL  Glucose, capillary     Status: Abnormal   Collection Time: 04/20/17 11:47 PM  Result Value Ref Range    Glucose-Capillary 164 (H) 65 - 99 mg/dL  Glucose, capillary     Status: Abnormal   Collection Time: 04/21/17  3:56 AM  Result Value Ref Range   Glucose-Capillary 151 (H) 65 - 99 mg/dL  Comprehensive metabolic panel     Status: Abnormal   Collection Time: 04/21/17  4:16 AM  Result Value Ref Range   Sodium 136 135 - 145 mmol/L   Potassium 4.8 3.5 - 5.1 mmol/L   Chloride 103 101 - 111 mmol/L   CO2 26 22 - 32 mmol/L   Glucose, Bld 161 (H) 65 - 99 mg/dL   BUN 17 6 - 20 mg/dL   Creatinine, Ser 0.85 0.44 - 1.00 mg/dL   Calcium 8.1 (L) 8.9 - 10.3 mg/dL   Total Protein 5.9 (L) 6.5 - 8.1 g/dL   Albumin 1.8 (L) 3.5 - 5.0 g/dL   AST 13 (L) 15 - 41 U/L   ALT 10 (L) 14 - 54 U/L   Alkaline Phosphatase 190 (H) 38 - 126 U/L   Total Bilirubin 0.5 0.3 - 1.2 mg/dL   GFR calc non Af Amer >60 >60 mL/min   GFR calc Af Amer >60 >60 mL/min    Comment: (NOTE) The eGFR has been calculated using the CKD EPI equation. This calculation has not been validated in all clinical situations. eGFR's persistently <60 mL/min signify possible Chronic Kidney Disease.    Anion gap 7 5 - 15  Magnesium     Status: Abnormal   Collection Time: 04/21/17  4:16 AM  Result Value Ref Range   Magnesium 1.5 (L) 1.7 - 2.4 mg/dL  Phosphorus     Status: None   Collection Time: 04/21/17  4:16 AM  Result Value Ref Range   Phosphorus 3.8 2.5 - 4.6 mg/dL  CBC     Status: Abnormal   Collection Time: 04/21/17  4:16 AM  Result Value Ref Range   WBC 25.4 (H) 4.0 - 10.5 K/uL   RBC 2.85 (L) 3.87 - 5.11 MIL/uL   Hemoglobin 7.7 (L)  12.0 - 15.0 g/dL   HCT 24.4 (L) 36.0 - 46.0 %   MCV 85.6 78.0 - 100.0 fL   MCH 27.0 26.0 - 34.0 pg   MCHC 31.6 30.0 - 36.0 g/dL   RDW 18.0 (H) 11.5 - 15.5 %   Platelets 411 (H) 150 - 400 K/uL  Glucose, capillary     Status: Abnormal   Collection Time: 04/21/17  8:15 AM  Result Value Ref Range   Glucose-Capillary 137 (H) 65 - 99 mg/dL    Imaging / Studies: No results found.  Medications /  Allergies: per chart  Antibiotics: Anti-infectives    Start     Dose/Rate Route Frequency Ordered Stop   04/18/17 2200  aztreonam (AZACTAM) 2 GM IVPB     2 g 100 mL/hr over 30 Minutes Intravenous Every 8 hours 04/18/17 1913     04/18/17 1000  fluconazole (DIFLUCAN) IVPB 200 mg  Status:  Discontinued     200 mg 100 mL/hr over 60 Minutes Intravenous Every 24 hours 04/18/17 0836 04/18/17 1448   04/16/17 2200  vancomycin (VANCOCIN) IVPB 1000 mg/200 mL premix  Status:  Discontinued     1,000 mg 200 mL/hr over 60 Minutes Intravenous Every 12 hours 04/16/17 0917 04/16/17 1154   04/16/17 2200  vancomycin (VANCOCIN) IVPB 750 mg/150 ml premix     750 mg 150 mL/hr over 60 Minutes Intravenous Every 12 hours 04/16/17 1154     04/16/17 1800  vancomycin (VANCOCIN) IVPB 750 mg/150 ml premix  Status:  Discontinued     750 mg 150 mL/hr over 60 Minutes Intravenous Every 12 hours 04/16/17 0359 04/16/17 0652   04/16/17 1245  metroNIDAZOLE (FLAGYL) IVPB 500 mg  Status:  Discontinued     500 mg 100 mL/hr over 60 Minutes Intravenous Every 8 hours 04/16/17 1244 04/16/17 1258   04/16/17 0930  vancomycin (VANCOCIN) IVPB 1000 mg/200 mL premix     1,000 mg 200 mL/hr over 60 Minutes Intravenous  Once 04/16/17 0911 04/16/17 1043   04/16/17 0600  aztreonam (AZACTAM) injection 2 g  Status:  Discontinued     2 g Intramuscular Every 8 hours 04/16/17 0354 04/16/17 0355   04/16/17 0400  metroNIDAZOLE (FLAGYL) IVPB 500 mg  Status:  Discontinued     500 mg 100 mL/hr over 60 Minutes Intravenous Every 8 hours 04/16/17 0354 04/18/17 0815   04/16/17 0400  vancomycin (VANCOCIN) IVPB 1000 mg/200 mL premix  Status:  Discontinued     1,000 mg 200 mL/hr over 60 Minutes Intravenous  Once 04/16/17 0354 04/16/17 0911   04/16/17 0400  aztreonam (AZACTAM) 2 g in dextrose 5 % 50 mL IVPB  Status:  Discontinued     2 g 100 mL/hr over 30 Minutes Intravenous Every 8 hours 04/16/17 0356 04/18/17 1913   04/16/17 0345  gentamicin  (GARAMYCIN) 240 mg in dextrose 5 % 50 mL IVPB  Status:  Discontinued     2.5 mg/kg  96.3 kg 112 mL/hr over 30 Minutes Intravenous Every 12 hours 04/16/17 0342 04/16/17 0354        Note: Portions of this report may have been transcribed using voice recognition software. Every effort was made to ensure accuracy; however, inadvertent computerized transcription errors may be present.   Any transcriptional errors that result from this process are unintentional.     T[place]ania Hall-Potvin, PA-S  _0 @  04/21/2017

## 2017-04-21 NOTE — Progress Notes (Signed)
Subjective/Chief Complaint:  1 - Bladder Injury / Right Ureteral Stricture -s/p robotic RIGHT ureteral reimplant with psoas hitch + HM Flap 04/01/17 at time of colostomy take down / adhesiolysis, small bowel resection, loop iliostomy for right distal stricture / bladder injury sustained at pelvic resection for advanced endometrial cancer 2016. Drain removed 04/07/17 as Cr same as serum and output scant.   2 - Pelvic Urinoma / Ileus - readmission 04/16/17 with dehydration, low iliostomy output and new anterior pelvic fluid collection. Drained and Cr 12.1 (c/w urinoma), delayed contast series show good renal excretion, bladder filling, but no direct communication with urinoma suggesting no active leak.   Today "[name]" is stable. Urinoma drain with high output with minimal Foley output. Examined this morning, and found the Foley to be kinked around her underwear. Worked with nursing to adjust Foley, and stressed importance of proper drainage of the Foley.    Objective: Vital signs in last 24 hours: Temp:  [98.4 F (36.9 C)-98.6 F (37 C)] 98.5 F (36.9 C) (09/20 0435) Pulse Rate:  [67-68] 67 (09/20 0435) Resp:  [16-17] 16 (09/20 0435) BP: (111-124)/(50-52) 111/52 (09/20 0435) SpO2:  [98 %-100 %] 98 % (09/20 0435) Last BM Date: 04/20/17 (ileostomy)  Intake/Output from previous day: 09/19 0701 - 09/20 0700 In: 1471.8 [P.O.:600; I.V.:571.8; IV Piggyback:300] Out: 1761 [Urine:1400; Drains:930; Stool:800] Intake/Output this shift: No intake/output data recorded.   General appearance: alert, cooperative and some weight loss noted.  Eyes: negative Nose: Nares normal. Septum midline. Mucosa normal. No drainage or sinus tenderness. Throat: lips, mucosa, and tongue normal; teeth and gums normal Neck: supple, symmetrical, trachea midline Resp: non-labored on minimal [place] O2.  Cardio: Nl rate at present.  GI: soft, non-tender; bowel sounds normal; no masses,  no organomegaly and RLQ iliostomy  patent. some blanchign erythema upper chest that is improving Recent surgiclal scars w/o drainage.  Pelvic: external genitalia normal and foley in place with yellow urine. Pelvic urinoma drain in place wtih clear yellow non-foul output.  Extremities: extremities normal, atraumatic, no cyanosis or edema Skin: Skin color, texture, turgor normal. No rashes or lesions Neurologic: Grossly normal Lab Results:   Recent Labs  04/20/17 0510 04/21/17 0416  WBC 24.8* 25.4*  HGB 7.7* 7.7*  HCT 24.0* 24.4*  PLT 417* 411*   BMET  Recent Labs  04/20/17 0510 04/21/17 0416  NA 138 136  K 4.4 4.8  CL 104 103  CO2 26 26  GLUCOSE 156* 161*  BUN 13 17  CREATININE 0.80 0.85  CALCIUM 8.1* 8.1*   PT/INR No results for input(s): LABPROT, INR in the last 72 hours. ABG No results for input(s): PHART, HCO3 in the last 72 hours.  Invalid input(s): PCO2, PO2  Studies/Results: No results found.  Anti-infectives: Anti-infectives    Start     Dose/Rate Route Frequency Ordered Stop   04/18/17 2200  aztreonam (AZACTAM) 2 GM IVPB     2 g 100 mL/hr over 30 Minutes Intravenous Every 8 hours 04/18/17 1913     04/18/17 1000  fluconazole (DIFLUCAN) IVPB 200 mg  Status:  Discontinued     200 mg 100 mL/hr over 60 Minutes Intravenous Every 24 hours 04/18/17 0836 04/18/17 1448   04/16/17 2200  vancomycin (VANCOCIN) IVPB 1000 mg/200 mL premix  Status:  Discontinued     1,000 mg 200 mL/hr over 60 Minutes Intravenous Every 12 hours 04/16/17 0917 04/16/17 1154   04/16/17 2200  vancomycin (VANCOCIN) IVPB 750 mg/150 ml premix  750 mg 150 mL/hr over 60 Minutes Intravenous Every 12 hours 04/16/17 1154     04/16/17 1800  vancomycin (VANCOCIN) IVPB 750 mg/150 ml premix  Status:  Discontinued     750 mg 150 mL/hr over 60 Minutes Intravenous Every 12 hours 04/16/17 0359 04/16/17 0652   04/16/17 1245  metroNIDAZOLE (FLAGYL) IVPB 500 mg  Status:  Discontinued     500 mg 100 mL/hr over 60 Minutes Intravenous  Every 8 hours 04/16/17 1244 04/16/17 1258   04/16/17 0930  vancomycin (VANCOCIN) IVPB 1000 mg/200 mL premix     1,000 mg 200 mL/hr over 60 Minutes Intravenous  Once 04/16/17 0911 04/16/17 1043   04/16/17 0600  aztreonam (AZACTAM) injection 2 g  Status:  Discontinued     2 g Intramuscular Every 8 hours 04/16/17 0354 04/16/17 0355   04/16/17 0400  metroNIDAZOLE (FLAGYL) IVPB 500 mg  Status:  Discontinued     500 mg 100 mL/hr over 60 Minutes Intravenous Every 8 hours 04/16/17 0354 04/18/17 0815   04/16/17 0400  vancomycin (VANCOCIN) IVPB 1000 mg/200 mL premix  Status:  Discontinued     1,000 mg 200 mL/hr over 60 Minutes Intravenous  Once 04/16/17 0354 04/16/17 0911   04/16/17 0400  aztreonam (AZACTAM) 2 g in dextrose 5 % 50 mL IVPB  Status:  Discontinued     2 g 100 mL/hr over 30 Minutes Intravenous Every 8 hours 04/16/17 0356 04/18/17 1913   04/16/17 0345  gentamicin (GARAMYCIN) 240 mg in dextrose 5 % 50 mL IVPB  Status:  Discontinued     2.5 mg/kg  96.3 kg 112 mL/hr over 30 Minutes Intravenous Every 12 hours 04/16/17 0342 04/16/17 0354      Assessment/Plan:  1 - Bladder Injury / Right Ureteral Stricture -no further intervention this admission. Current stent and foley to remain.   2 - Pelvic Urinoma / Ileus - Given high output of drain overnight, likely small continued leak.. Agree with keep current dain until output nil, recheck drain Cr, and then remove. Extremely important to ensure proper drainage of the Foley. Today will ensure Foley is draining and reassess drain output. If output does not decrease, then take off suction x few days and keep above level of foley.     Stasia Cavalier 04/21/2017   Patient was seen, examined,treatment plan was discussed with the resident.  I have directly reviewed the clinical findings, lab, imaging studies and management of this patient in detail. I have made the necessary changes and/or additions to the above noted documentation, and agree with  the documentation, as recorded by the resident.

## 2017-04-21 NOTE — Progress Notes (Signed)
[place] NOTE   Pharmacy Consult for TPN Indication: Bowel obstruction  Patient Measurements: Height: 5' 2.5" (158.8 cm) Weight: 212 lb 3.2 oz (96.3 kg) IBW/kg (Calculated) : 51.25Weight (04/07/17): 96.3kg  Height (04/01/17): 62.5 inches TPN AdjBW (KG): 62.5 Body mass index is 38.19 kg/m.    Insulin Requirements: 16 units  Current Nutrition: NPO  IVF: LR with 30meq KCl at 34ml/hr  Central access: Double lumen PICC placed 04/06/17 TPN start date: 04/16/17  ASSESSMENT                                                                                                          HPI: 61 yo female with endometrial/ovarian cancer diagnosed in 2006, status post TAH/BSO and chemotherapy, recurrence 2016 underwent resection with colectomy & colostomy and radiation to the pelvis. On 8/31 underwent laparoscopic takedown of her colostomy with anastomosis, LOA, iliocecal ectomy and diverting ileostomy by Dr. Johney Maine. Also underwent reimplantation of her right ureter by Dr. Tresa Moore.  Now presents with fever and partial obstruction or ileus. Surgery recommended bowel rest with NG suction, TNA and broad spectrum antibiotics. Consulting IR regarding drainage of her pelvic fluid collections.  Significant events:  9/19: TPN rate at 22ml/hr instead of 67ml/hr as ordered 9/20: advancing to regular diet, begin wean TPN  Today:   Glucose: Hx DM on metformin and sitagliptin PTA; CBG fairly stable - range 151-186 last 24hr  (Avg CBG 160)  Electrolytes: WNL except mag low at 1.5 - will replace  Renal: SCr wnl  LFTs: AlkPhos elevated at 190, AST/ALT low, Tbili WNL  TGs: 89 (9/17)  Prealbumin: 9.6 (9/17)  NUTRITIONAL GOALS                                                                                             RD recs: Protein 65-75gm/day, Kcal 1600-1800/day Clinimix 5/15 at a goal rate of 60 ml/hr + 20% fat emulsion at 20 ml/hr over 12 hr to provide: 72 g/day  protein, 1500Kcal/day. Prefer to use the lower Dextrose formula with elevated CBG's > will deliver less Kcal than desired  PLAN  Magnesium repletion 4gm IVPB this am  At 1800 today:  Reduce Clinimix E 5/15 to rate of 40 ml/hr.  20% fat emulsion at 70ml/hr over 12hr.  Prenatal Vitamin ordered po.  Continue SSI moderate scale q4h for now  TPN lab panels on Mondays & Thursdays.  F/u daily.  Minda Ditto PharmD Pager [phone] 04/21/2017, 7:28 AM

## 2017-04-22 LAB — BASIC METABOLIC PANEL
ANION GAP: 8 (ref 5–15)
BUN: 20 mg/dL (ref 6–20)
CALCIUM: 8.3 mg/dL — AB (ref 8.9–10.3)
CO2: 25 mmol/L (ref 22–32)
Chloride: 104 mmol/L (ref 101–111)
Creatinine, Ser: 1.03 mg/dL — ABNORMAL HIGH (ref 0.44–1.00)
GFR calc Af Amer: >60 mL/min (ref >60)
GFR, EST NON AFRICAN AMERICAN: 57 mL/min — AB (ref >60)
GLUCOSE: 147 mg/dL — AB (ref 65–99)
Potassium: 5 mmol/L (ref 3.5–5.1)
SODIUM: 137 mmol/L (ref 135–145)

## 2017-04-22 LAB — CBC
HCT: 24.8 % — ABNORMAL LOW (ref 36.0–46.0)
Hemoglobin: 8 g/dL — ABNORMAL LOW (ref 12.0–15.0)
MCH: 28 pg (ref 26.0–34.0)
MCHC: 32.3 g/dL (ref 30.0–36.0)
MCV: 86.7 fL (ref 78.0–100.0)
PLATELETS: 398 10*3/uL (ref 150–400)
RBC: 2.86 MIL/uL — ABNORMAL LOW (ref 3.87–5.11)
RDW: 18.1 % — AB (ref 11.5–15.5)
WBC: 24 10*3/uL — AB (ref 4.0–10.5)

## 2017-04-22 LAB — GLUCOSE, CAPILLARY
GLUCOSE-CAPILLARY: 116 mg/dL — AB (ref 65–99)
GLUCOSE-CAPILLARY: 138 mg/dL — AB (ref 65–99)
GLUCOSE-CAPILLARY: 138 mg/dL — AB (ref 65–99)
GLUCOSE-CAPILLARY: 160 mg/dL — AB (ref 65–99)
Glucose-Capillary: 144 mg/dL — ABNORMAL HIGH (ref 65–99)

## 2017-04-22 LAB — VANCOMYCIN, TROUGH: VANCOMYCIN TR: 19 ug/mL (ref 15–20)

## 2017-04-22 LAB — MAGNESIUM: Magnesium: 1.7 mg/dL (ref 1.7–2.4)

## 2017-04-22 MED ORDER — VANCOMYCIN HCL 500 MG IV SOLR: 500.0000 mg | Freq: Two times a day (BID) | INTRAVENOUS | Status: DC

## 2017-04-22 MED ORDER — LOPERAMIDE HCL 2 MG PO CAPS: 2.0000 mg | ORAL_CAPSULE | Freq: Three times a day (TID) | ORAL | Status: DC | PRN

## 2017-04-22 MED ORDER — BISMUTH SUBSALICYLATE 262 MG/15ML PO SUSP
30.0000 mL | Freq: Three times a day (TID) | ORAL | Status: DC | PRN
  Filled 2017-04-22: qty 236

## 2017-04-22 MED ORDER — VANCOMYCIN HCL 500 MG IV SOLR
500.0000 mg | Freq: Two times a day (BID) | INTRAVENOUS | Status: DC
  Administered 2017-04-23 – 2017-04-25 (×5): 500 mg via INTRAVENOUS
  Filled 2017-04-22 (×6): qty 500

## 2017-04-22 MED ORDER — VANCOMYCIN HCL 500 MG IV SOLR
500.0000 mg | Freq: Two times a day (BID) | INTRAVENOUS | Status: AC
  Administered 2017-04-22: 500 mg via INTRAVENOUS
  Filled 2017-04-22: qty 500

## 2017-04-22 MED ORDER — LOPERAMIDE HCL 2 MG PO CAPS
2.0000 mg | ORAL_CAPSULE | Freq: Every day | ORAL | Status: DC
  Administered 2017-04-22 – 2017-04-24 (×3): 2 mg via ORAL
  Filled 2017-04-22 (×3): qty 1

## 2017-04-22 NOTE — Progress Notes (Signed)
PHARMACY CONSULT NOTE FOR:  OUTPATIENT  PARENTERAL ANTIBIOTIC THERAPY (OPAT)  Indication: Intra-abdominal infection Regimen: Vancomycin 500mg  IV q12, Aztreonam 2gm q8 End date: 10/19  IV antibiotic discharge orders are pended. To discharging provider:  please sign these orders via discharge navigator,  Select New Orders & click on the button choice - Manage This Unsigned Work.     Thank you for allowing pharmacy to be a part of this patient's care.  Minda Ditto 04/22/2017, 3:09 PM

## 2017-04-22 NOTE — Progress Notes (Addendum)
HH orders received. Plan for resumption of IVF, patient active with [facility]. Contacted [facility] to notify them of orders. Unsure of disposition date. Per ID note will continue IV abx at home, has PICC, [facility] aware and will f/u with teaching.

## 2017-04-22 NOTE — Progress Notes (Addendum)
[address] [place], [place] [address] Phone: [phone]  FAX: [phone]      [name] [id number] 1955/08/08  CARE TEAM:  PCP: Ann Held, DO  Outpatient Care Team: Patient Care Team: Carollee Herter, Alferd Apa, DO as PCP - General (Family Medicine) Heath Lark, MD as Consulting Physician (Hematology and Oncology) Everitt Amber, MD as Consulting Physician (Obstetrics and Gynecology) Michael [name], MD as Consulting Physician (General Surgery) Alexis Frock, MD as Consulting Physician (Urology) Altheimer, Legrand Como, MD as Consulting Physician (Endocrinology) Katy Apo, MD as Consulting Physician (Ophthalmology) Stark Klein, MD as Consulting Physician (General Surgery) Irene Limbo, MD as Consulting Physician (Plastic Surgery)  Inpatient Treatment Team: Treatment Team: Attending Provider: Michael [name], MD; Consulting Physician: Dorthy Cooler Radiology, MD; Registered Nurse: Nila Nephew, RN; Technician: Leda Quail, NT; Consulting Physician: Alexis Frock, MD; Registered Nurse: Oleta Mouse, RN; Consulting Physician: Michel Bickers, MD; Registered Nurse: Bayard Hugger, RN; Registered Nurse: Neil Crouch D, RN; Technician: Graylon Good, NT; Registered Nurse: Darrell Jewel, RN   Problem List:   Principal Problem:   Pelvic abscess in female Active Problems:   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 s/p resection/partial vaginectomy/ LAR/colostomy 11/19/2014   Ureteral stricture, right, s/p resection & bladder hitch reimplantation 04/01/2017   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   Chronic neutropenia ([place])   DM type 2 (diabetes mellitus, type 2) ([place])   Benign essential HTN   History of ovarian & endometrial cancer   Morbid obesity ([place])   Pancytopenia, acquired ([place])   Chronic anemia   Breast cancer of upper-inner  quadrant of left female breast ([place])   Hot flashes related to aromatase inhibitor therapy   Pelvic cancer s/p colostomy takedown/loop ileostomy diversion 04/01/2017   Postoperative anemia   Ileus (HCC)   Protein-calorie malnutrition, moderate (HCC)           Assessment  Guarded: Slow recovery  Plan:   -Regular diet with supp shakes  -Continue vancomycin & aztreonam for pelvic infection prevention.  Per ID current abx regimen is appropriate.  Awaiting plan for outpt ABX from ID  -Monitor ileostomy site for signs of further necrosis or complications.  Will ask WOCN to see again given change in ileostomy anatomy from partial necrosis & pt anxiety  -Continue to monitor lytes  -Hemoglobin low but stable in pancytopenic pt  -Continue neupogen injections for pancytopenia  -Continue Rheumatex PO  -VTE prophylaxis: SCDs, etc  -Ambulate as tolerated to help recovery  Home health nursing vs SNF:  PICC line care.  Isotonic fluid boluses via PICC   Lactated Ringer's (may substitute for normal saline).   1 L IV over four hours.  Nursing may adjust rate.  Ideally as fast as possible. Administer every Monday Wednesday Friday. Anticipate x 6 weeks minimum BMET qMonday x 3 weeks (until seen in [place] office)  OSTOMY care  Patient has an ileostomy Help with teaching/troubleshooting pouching Help with diet recommendations/teaching Encourage oral fluid hydration Help instruct use of antidiarrheals (loperamide, lomotil, iron, etc) to slow down diarrhea to 4-6 bag emptyings a day  Foley care - will need foley ##weeks with bladder surgery & urinoma  HH IV ABx - awaiting ID plan  Drain care - has perc drain in urinoma - most likely will need drain study in 3 weeks to r/o fistula to baldder or coloanal anastomosis.  Serial drain tests for Cr likely.  30 minutes spent in review, evaluation, examination, counseling, and coordination of care.  More than 50% of that time was  spent in counseling.  [name], M.D., F.A.C.S. Gastrointestinal and Minimally Invasive Surgery [facility] [address] [place], [place] [address] [phone] Main / Paging    04/22/2017    Subjective: (Chief complaint)  Tolerated reg diet.  Appetite fair.  Ambulating better. Flatus and stool in ostomy bag.   Nausea has been reduced w/ antiemetics at this time. Rash margins on upper abdomen and chest improving. Abdominal pain is improving. Foley was kinked, contributed to high drain output - less last 24 hours - follow & per urology Patient less tearful today.  Tired/depressed but consolable.   Objective:  Vital signs:  Vitals:   04/21/17 0435 04/21/17 1413 04/21/17 2131 04/22/17 0556  BP: (!) 111/52 (!) 108/51 (!) 114/56 (!) 122/53  Pulse: 67 75 66 65  Resp: 16 16 16 16   Temp: 98.5 F (36.9 C) 98.7 F (37.1 C) 99.2 F (37.3 C) 98.6 F (37 C)  TempSrc: Oral Oral Oral Oral  SpO2: 98% 97% 98% 99%  Weight:      Height:        Last BM Date: 04/21/17  Intake/Output   Yesterday:  09/20 0701 - 09/21 0700 In: 2877.5 [P.O.:1190; I.V.:1087.5; IV [id number]:865] Out: 7846 [Urine:2950; Drains:441; Stool:800] This shift:  No intake/output data recorded.  Bowel function:  Flatus: YES   BM:  YES  Drain: Tubing bilious stained but serosanguinous fluid in bulb   Physical Exam:  General: Pt awake/alert/oriented x4 in no acute distress Eyes: PERRL, normal EOM.  Sclera clear.  No icterus Neuro: CN II-XII intact w/o focal sensory/motor deficits. Lymph: No head/neck/groin lymphadenopathy Psych:  No delerium/psychosis/paranoia.  Mildly sad - better today HENT: Normocephalic, Mucus membranes moist.  No thrush Neck: Supple, No tracheal deviation Chest: No chest wall pain w good excursion CV:  Pulses intact.  Regular rhythm MS: Normal AROM mjr joints.  No obvious deformity  Abdomen: Soft.  Nondistended.  No tenderness to  palpation.  No evidence of peritonitis.  No incarcerated hernias.  Incisions are dry and intact.  Drain is intact.    Proximal superior) limb of loop ileostomy is necroses auto-amputating - no leak and flatus and thin succus are present in bag.  GU.  Foley clear yellow urine Ext:   No deformity.  No mjr edema.  No cyanosis Skin: No petechiae / purpura  Results:   Labs: Results for orders placed or performed during the hospital encounter of 04/15/17 (from the past 48 hour(s))  Glucose, capillary     Status: Abnormal   Collection Time: 04/20/17 12:08 PM  Result Value Ref Range   Glucose-Capillary 186 (H) 65 - 99 mg/dL  Glucose, capillary     Status: Abnormal   Collection Time: 04/20/17  4:06 PM  Result Value Ref Range   Glucose-Capillary 124 (H) 65 - 99 mg/dL  Glucose, capillary     Status: Abnormal   Collection Time: 04/20/17  7:49 PM  Result Value Ref Range   Glucose-Capillary 151 (H) 65 - 99 mg/dL  Glucose, capillary     Status: Abnormal   Collection Time: 04/20/17 11:47 PM  Result Value Ref Range   Glucose-Capillary 164 (H) 65 - 99 mg/dL  Glucose, capillary     Status: Abnormal   Collection Time: 04/21/17  3:56 AM  Result Value Ref Range   Glucose-Capillary 151 (H) 65 -  99 mg/dL  Comprehensive metabolic panel     Status: Abnormal   Collection Time: 04/21/17  4:16 AM  Result Value Ref Range   Sodium 136 135 - 145 mmol/L   Potassium 4.8 3.5 - 5.1 mmol/L   Chloride 103 101 - 111 mmol/L   CO2 26 22 - 32 mmol/L   Glucose, Bld 161 (H) 65 - 99 mg/dL   BUN 17 6 - 20 mg/dL   Creatinine, Ser 0.85 0.44 - 1.00 mg/dL   Calcium 8.1 (L) 8.9 - 10.3 mg/dL   Total Protein 5.9 (L) 6.5 - 8.1 g/dL   Albumin 1.8 (L) 3.5 - 5.0 g/dL   AST 13 (L) 15 - 41 U/L   ALT 10 (L) 14 - 54 U/L   Alkaline Phosphatase 190 (H) 38 - 126 U/L   Total Bilirubin 0.5 0.3 - 1.2 mg/dL   GFR calc non Af Amer >60 >60 mL/min   GFR calc Af Amer >60 >60 mL/min    Comment: (NOTE) The eGFR has been calculated using  the CKD EPI equation. This calculation has not been validated in all clinical situations. eGFR's persistently <60 mL/min signify possible Chronic Kidney Disease.    Anion gap 7 5 - 15  Magnesium     Status: Abnormal   Collection Time: 04/21/17  4:16 AM  Result Value Ref Range   Magnesium 1.5 (L) 1.7 - 2.4 mg/dL  Phosphorus     Status: None   Collection Time: 04/21/17  4:16 AM  Result Value Ref Range   Phosphorus 3.8 2.5 - 4.6 mg/dL  CBC     Status: Abnormal   Collection Time: 04/21/17  4:16 AM  Result Value Ref Range   WBC 25.4 (H) 4.0 - 10.5 K/uL   RBC 2.85 (L) 3.87 - 5.11 MIL/uL   Hemoglobin 7.7 (L) 12.0 - 15.0 g/dL   HCT 24.4 (L) 36.0 - 46.0 %   MCV 85.6 78.0 - 100.0 fL   MCH 27.0 26.0 - 34.0 pg   MCHC 31.6 30.0 - 36.0 g/dL   RDW 18.0 (H) 11.5 - 15.5 %   Platelets 411 (H) 150 - 400 K/uL  Glucose, capillary     Status: Abnormal   Collection Time: 04/21/17  8:15 AM  Result Value Ref Range   Glucose-Capillary 137 (H) 65 - 99 mg/dL  Glucose, capillary     Status: Abnormal   Collection Time: 04/21/17 11:51 AM  Result Value Ref Range   Glucose-Capillary 161 (H) 65 - 99 mg/dL  Glucose, capillary     Status: Abnormal   Collection Time: 04/21/17  4:31 PM  Result Value Ref Range   Glucose-Capillary 146 (H) 65 - 99 mg/dL  Glucose, capillary     Status: None   Collection Time: 04/21/17  7:59 PM  Result Value Ref Range   Glucose-Capillary 98 65 - 99 mg/dL  Glucose, capillary     Status: Abnormal   Collection Time: 04/22/17 12:20 AM  Result Value Ref Range   Glucose-Capillary 138 (H) 65 - 99 mg/dL  Glucose, capillary     Status: Abnormal   Collection Time: 04/22/17  4:11 AM  Result Value Ref Range   Glucose-Capillary 144 (H) 65 - 99 mg/dL  CBC     Status: Abnormal   Collection Time: 04/22/17  4:30 AM  Result Value Ref Range   WBC 24.0 (H) 4.0 - 10.5 K/uL   RBC 2.86 (L) 3.87 - 5.11 MIL/uL   Hemoglobin 8.0 (L) 12.0 - 15.0  g/dL   HCT 24.8 (L) 36.0 - 46.0 %   MCV 86.7 78.0 -  100.0 fL   MCH 28.0 26.0 - 34.0 pg   MCHC 32.3 30.0 - 36.0 g/dL   RDW 18.1 (H) 11.5 - 15.5 %   Platelets 398 150 - 400 K/uL  Basic metabolic panel     Status: Abnormal   Collection Time: 04/22/17  4:30 AM  Result Value Ref Range   Sodium 137 135 - 145 mmol/L   Potassium 5.0 3.5 - 5.1 mmol/L   Chloride 104 101 - 111 mmol/L   CO2 25 22 - 32 mmol/L   Glucose, Bld 147 (H) 65 - 99 mg/dL   BUN 20 6 - 20 mg/dL   Creatinine, Ser 1.03 (H) 0.44 - 1.00 mg/dL   Calcium 8.3 (L) 8.9 - 10.3 mg/dL   GFR calc non Af Amer 57 (L) >60 mL/min   GFR calc Af Amer >60 >60 mL/min    Comment: (NOTE) The eGFR has been calculated using the CKD EPI equation. This calculation has not been validated in all clinical situations. eGFR's persistently <60 mL/min signify possible Chronic Kidney Disease.    Anion gap 8 5 - 15  Glucose, capillary     Status: Abnormal   Collection Time: 04/22/17  7:41 AM  Result Value Ref Range   Glucose-Capillary 116 (H) 65 - 99 mg/dL    Imaging / Studies: No results found.  Medications / Allergies: per chart  Antibiotics: Anti-infectives    Start     Dose/Rate Route Frequency Ordered Stop   04/18/17 2200  aztreonam (AZACTAM) 2 GM IVPB     2 g 100 mL/hr over 30 Minutes Intravenous Every 8 hours 04/18/17 1913     04/18/17 1000  fluconazole (DIFLUCAN) IVPB 200 mg  Status:  Discontinued     200 mg 100 mL/hr over 60 Minutes Intravenous Every 24 hours 04/18/17 0836 04/18/17 1448   04/16/17 2200  vancomycin (VANCOCIN) IVPB 1000 mg/200 mL premix  Status:  Discontinued     1,000 mg 200 mL/hr over 60 Minutes Intravenous Every 12 hours 04/16/17 0917 04/16/17 1154   04/16/17 2200  vancomycin (VANCOCIN) IVPB 750 mg/150 ml premix     750 mg 150 mL/hr over 60 Minutes Intravenous Every 12 hours 04/16/17 1154     04/16/17 1800  vancomycin (VANCOCIN) IVPB 750 mg/150 ml premix  Status:  Discontinued     750 mg 150 mL/hr over 60 Minutes Intravenous Every 12 hours 04/16/17 0359 04/16/17  0652   04/16/17 1245  metroNIDAZOLE (FLAGYL) IVPB 500 mg  Status:  Discontinued     500 mg 100 mL/hr over 60 Minutes Intravenous Every 8 hours 04/16/17 1244 04/16/17 1258   04/16/17 0930  vancomycin (VANCOCIN) IVPB 1000 mg/200 mL premix     1,000 mg 200 mL/hr over 60 Minutes Intravenous  Once 04/16/17 0911 04/16/17 1043   04/16/17 0600  aztreonam (AZACTAM) injection 2 g  Status:  Discontinued     2 g Intramuscular Every 8 hours 04/16/17 0354 04/16/17 0355   04/16/17 0400  metroNIDAZOLE (FLAGYL) IVPB 500 mg  Status:  Discontinued     500 mg 100 mL/hr over 60 Minutes Intravenous Every 8 hours 04/16/17 0354 04/18/17 0815   04/16/17 0400  vancomycin (VANCOCIN) IVPB 1000 mg/200 mL premix  Status:  Discontinued     1,000 mg 200 mL/hr over 60 Minutes Intravenous  Once 04/16/17 0354 04/16/17 0911   04/16/17 0400  aztreonam (AZACTAM) 2 g  in dextrose 5 % 50 mL IVPB  Status:  Discontinued     2 g 100 mL/hr over 30 Minutes Intravenous Every 8 hours 04/16/17 0356 04/18/17 1913   04/16/17 0345  gentamicin (GARAMYCIN) 240 mg in dextrose 5 % 50 mL IVPB  Status:  Discontinued     2.5 mg/kg  96.3 kg 112 mL/hr over 30 Minutes Intravenous Every 12 hours 04/16/17 0342 04/16/17 0354        Note: Portions of this report may have been transcribed using voice recognition software. Every effort was made to ensure accuracy; however, inadvertent computerized transcription errors may be present.   Any transcriptional errors that result from this process are unintentional.     [place] Hall-Potvin, PA-S  @SCGSIGNATURE @  04/22/2017

## 2017-04-22 NOTE — Progress Notes (Signed)
Patient ID: [name], female   DOB: July 27, 1956, 61 y.o.   MRN: [id number]          [facility]    Date of Admission:  04/15/2017   Total days of antibiotics 6         There is very eager to go home but she remains extremely weak. She had 800 mL of fluid out of her pelvic drain yesterday. She states that she feels like she is on an awful lot of oral medications and is struggling with nausea. I talked to her about antibiotic options upon discharge. While we could use oral linezolid and levofloxacin she is leery of adding more oral medication. Based on my discussion with her today I favor continuing IV vancomycin and aztreonam since she will be going home with her PICC line anyway. Please call Dr. Talbot Grumbling [phone]) for any infectious disease questions this weekend.  Diagnosis: Pelvic abscess  Culture Result: Enterococcus and Serratia  Allergies  Allergen Reactions  . Penicillins Swelling    Facial swelling Has patient had a PCN reaction causing immediate rash, facial/tongue/throat swelling, SOB or lightheadedness with hypotension: Yes Has patient had a PCN reaction causing severe rash involving mucus membranes or skin necrosis: Yes Has patient had a PCN reaction that required hospitalization No Has patient had a PCN reaction occurring within the last 10 years: No If all of the above answers are "NO", then may proceed with Cephalosporin use.   [name] Kitchen Ultram [Tramadol] Hives  . Adhesive [Tape]     blisters  . Cefaclor Rash    Ceclor  . Erythromycin     Gastritis, abd cramps  . Trimethoprim Rash  . Ciprofloxacin Other (See Comments)    Unknown On Dr notes   . Fluconazole Rash  . Oxycodone     " I just feel weird"  . Pectin Rash    Pectin ring for stoma  . Sulfa Antibiotics Rash    OPAT Orders Discharge antibiotics: Per pharmacy protocol Vancomycin and aztreonam Aim for Vancomycin trough 15-20 (unless otherwise indicated) Duration: 4  weeks End Date: 05/20/2017  [facility] Care Per Protocol:  Labs weekly while on IV antibiotics: _x_ CBC with differential _x_ BMP __ CMP __ CRP __ ESR _x_ Vancomycin trough  __ Please pull PIC at completion of IV antibiotics _x_ Please leave PIC in place until doctor has seen patient or been notified  Fax weekly labs to [phone]  Clinic Follow Up Appt: I will arrange follow-up in my clinic within 4 weeks        Michel Bickers, MD [facility] [name] [phone] pager   [phone] cell 04/22/2017, 10:34 AM

## 2017-04-22 NOTE — Progress Notes (Addendum)
Pharmacy Antibiotic Note  [name] is a 61 y.o. female with endometrial/ovarian cancer admitted on 04/15/2017 with fever and partial obstruction or ileus.  Pharmacy has been consulted for vancomycin and aztreonam dosing for intra-abdomal infection; MD dosing flagyl but mix up with allergies and Flagyl and Diflucan d/c'd by MD. Patient told MD/resident that rash from Flagyl - also mentioned same to me - but stated the antibiotic was for thrush- which was Fluconazole started PTA - rash noted on admission.  Plan: Plan Vancomycin & Aztreonam as outpatient, SCr bumped to 1.03 Will Reduce Vancomycin to 500mg  IV q12h - goal 15-20 mcg/mL, trough today 19 mcg/ml Continue Aztreonam 2g IV q8h OPAT orders pended for Vanc/Aztreonam to end date of 10/19 Note with Flagyl d/c'd, patient currently without any anaerobic coverage Received one dose Fluconazole 200mg  IV on 9/17 Rash continues to improve  Height: 5' 2.5" (158.8 cm) Weight: 212 lb 3.2 oz (96.3 kg) IBW/kg (Calculated) : 51.25  Temp (24hrs), Avg:98.8 F (37.1 C), Min:98.6 F (37 C), Max:99.2 F (37.3 C)   Recent Labs Lab 04/16/17 0045  04/16/17 0525  04/18/17 0427 04/19/17 0445 04/19/17 2231 04/20/17 0510 04/21/17 0416 04/22/17 0430  WBC  --   < >  --   < > 10.3 28.4*  --  24.8* 25.4* 24.0*  CREATININE  --   < >  --   < > 0.79 0.82  --  0.80 0.85 1.03*  LATICACIDVEN 0.77  --  0.79  --   --   --   --   --   --   --   VANCOTROUGH  --   --   --   --   --   --  16  --   --   --   < > = values in this interval not displayed.  Estimated Creatinine Clearance: 62 mL/min (A) (by C-G formula based on SCr of 1.03 mg/dL (H)).    Antimicrobials this admission:  9/15 Vancomycin  >> 9/15 aztreonam  >>  9/15 Flagyl  >> 9/17 9/17 Fluconazole >> 9/17  Dose adjustments this admission:  9/18 VT at 2100 = 16 mcg/mL on 750mg  IV q12 (prior to 7th dose) 9/21 VT at 1030 = 19 mcg/ml, on 750mg  IV q12, reduce to 500mg  q12  Microbiology results:   9/15 BCx: ngtd 9/15 Wound abscess: serratia only resistant to ancef and enterococcus pansens  Thank you for allowing pharmacy to be a part of this patient's care.  Minda Ditto PharmD Pager [phone] 04/22/2017, 10:36 AM

## 2017-04-22 NOTE — Consult Note (Addendum)
[place] Nurse ostomy follow up Stoma type/location: 1 and 3/8 inch ileostomy, long, avascular portion of stoma has sloughed off and reveals a skin level stoma with just a small amount of avascular tissue remaining. Stomal assessment/size: 1 and 3/8 inches round, red, flush, moist, os at center Peristomal assessment: Intact Treatment options for stomal/peristomal skin: skin barrier ring Output: soft brown effluent Ostomy pouching: 2pc. 2 and 1/4 inch pouching system with skin barrier ring Education provided: Patient supported as she described events of past 24-48 hours. Looking forward to going home this weekend, reports feeling lonely and anxious at night after husband leaves. More confident now that avascular portion of stoma has auto debrided. Asking questions about signs and symptoms of problems with ostomy, we discuss diet and the need to avoid dehydration, reinforce why fluids are needed, recommend protein shakes continue, etc. Enrolled patient in [place] Start Discharge program: No. Established with supplier, will use same supplies that she used for previous ostomy. [place] nursing team will follow, and will remain available to this patient, the nursing, surgical and medical teams.   Thanks, Maudie Flakes, MSN, RN, [place], Arther Abbott  Pager# [phone]

## 2017-04-22 NOTE — Progress Notes (Signed)
Subjective/Chief Complaint:  1 - Bladder Injury / Right Ureteral Stricture -s/p robotic RIGHT ureteral reimplant with psoas hitch + HM Flap 04/01/17 at time of colostomy take down / adhesiolysis, small bowel resection, loop iliostomy for right distal stricture / bladder injury sustained at pelvic resection for advanced endometrial cancer 2016. Drain removed 04/07/17 as Cr same as serum and output scant.   2 - Pelvic Urinoma - readmission 04/16/17 with dehydration, low iliostomy output and new anterior pelvic fluid collection. Drained and Cr 12.1 (c/w urinoma). Despite CT suggesting no active leak, her drain output has continued to be high, and corresponds with high or low Foley drainage.   Today "Brilyn" is stable. Urinoma drain with improved but still high (40cc over last 24 hours) despite good Foley drainage. JP taken off suction today, and secured above the level of the bladder. Again stressed importance of proper drainage of the Foley with nursing.    Objective: Vital signs in last 24 hours: Temp:  [98.6 F (37 C)-99.2 F (37.3 C)] 98.6 F (37 C) (09/21 0556) Pulse Rate:  [65-75] 65 (09/21 0556) Resp:  [16] 16 (09/21 0556) BP: (108-122)/(51-56) 122/53 (09/21 0556) SpO2:  [97 %-99 %] 99 % (09/21 0556) Last BM Date: 04/21/17  Intake/Output from previous day: 09/20 0701 - 09/21 0700 In: 2877.5 [P.O.:1190; I.V.:1087.5; IV Piggyback:600] Out: 3382 [Urine:2950; Drains:441; Stool:800] Intake/Output this shift: Total I/O In: 120 [P.O.:120] Out: 700 [Urine:700]   General appearance: alert, cooperative Eyes: negative Nose: Nares normal. Septum midline. Mucosa normal. No drainage or sinus tenderness. Throat: lips, mucosa, and tongue normal; teeth and gums normal Neck: supple, symmetrical, trachea midline Resp: non-labored on RA Cardio: Nl rate at present.  GI: soft, non-tender; bowel sounds normal; no masses,  no organomegaly and RLQ iliostomy patent. Recent surgiclal scars w/o  drainage.  Pelvic: external genitalia normal and foley in place with yellow urine. Pelvic urinoma drain in place wtih clear yellow non-foul output.  Extremities: extremities normal, atraumatic, no cyanosis or edema Skin: Skin color, texture, turgor normal. No rashes or lesions Neurologic: Grossly normal Lab Results:    Recent Labs  04/21/17 0416 04/22/17 0430  WBC 25.4* 24.0*  HGB 7.7* 8.0*  HCT 24.4* 24.8*  PLT 411* 398   BMET  Recent Labs  04/21/17 0416 04/22/17 0430  NA 136 137  K 4.8 5.0  CL 103 104  CO2 26 25  GLUCOSE 161* 147*  BUN 17 20  CREATININE 0.85 1.03*  CALCIUM 8.1* 8.3*    Assessment/Plan:  1 - Bladder Injury / Right Ureteral Stricture -No further intervention this admission. Current stent and foley to remain through discharge.   2 - Pelvic Urinoma / Ileus - Given high output of drain, likely small continued leak. Agree with keep current dain until output nil. Extremely important to ensure proper drainage of the Foley.   Today, JP taken off suction to allow for preferential drainage through Foley. This will stay in place through discharge. 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 She was make aware of the importance of keeping her Foley draining to allow for healing of her leak. We also discussed the rationale behind keeping the JP off suction. She will empty and record her JP output daily.   We will plan to see her back in 2 weeks to evaluate her JP output. Will consider a cystogram at that time if JP output minimal   Urology will continue to follow while in house. Please  page with any questions or concerns.     Stasia Cavalier 04/22/2017   Patient was seen, examined,treatment plan was discussed with the resident.  I have directly reviewed the clinical findings, lab, imaging studies and management of this patient in detail. I have made the necessary changes and/or additions to the above noted documentation, and agree  with the documentation, as recorded by the resident.

## 2017-04-22 NOTE — Progress Notes (Signed)
Dr. Jeffie Pollock with urology called at this time as pt's catheter has developed into pink tinged output. No clots or sediment noted and no decrease in output. JP drainage not increased at this time. Dr. Jeffie Pollock with orders to monitor output at this time and if consistently pink during the PM, discuss with CCS about holding dose of lovenox. Pt and family at bedside updated and verbalized agreement with plan of care. Will continue to monitor.

## 2017-04-22 NOTE — Progress Notes (Signed)
Referring Physician(s):  Dr. Michael [name]  Supervising Physician: Corrie Mckusick  Patient Status:  [facility] - In-pt  Chief Complaint: Intra-abdominal fluid collection  Subjective: Having some back pain today, but overall is feeling better. States she may be able to go home tomorrow.   Allergies: Penicillins; Ultram [tramadol]; Adhesive [tape]; Cefaclor; Erythromycin; Trimethoprim; Ciprofloxacin; Fluconazole; Oxycodone; Pectin; and Sulfa antibiotics  Medications: Prior to Admission medications   Medication Sig Start Date End Date Taking? Authorizing Provider  anastrozole (ARIMIDEX) 1 MG tablet TAKE 1 TABLET DAILY 12/08/16  Yes Heath Lark, MD  aspirin EC 81 MG tablet Take 81 mg by mouth daily.   Yes [provider]  Biotin 5 MG TABS Take 5 mg by mouth every morning.    Yes [provider]  Calcium-Magnesium-Vitamin D 400-166.7-133.3 MG-MG-UNIT TABS Take 1 tablet by mouth daily.    Yes [provider]  Cholecalciferol (VITAMIN D3) 10000 UNITS capsule Take 10,000 Units by mouth once a week. Sunday evening's   Yes [provider]  diphenhydrAMINE (BENADRYL) 25 MG tablet Take 25 mg by mouth at bedtime as needed for itching or sleep. Reported on 09/15/2015   Yes [provider]  filgrastim (NEUPOGEN) 480 MCG/1.6ML injection Inject 1.6 ml under the skin every 5 days for life Patient taking differently: Inject 480 mcg into the skin See admin instructions. Inject 1.6 ml under the skin every 6 days for life 01/06/17  Yes Gorsuch, Ni, MD  fluconazole (DIFLUCAN) 200 MG tablet Take 200 mg by mouth daily. 04/12/17  Yes [provider]  HYDROmorphone (DILAUDID) 2 MG tablet Take 1-2 tablets (2-4 mg total) by mouth every 4 (four) hours as needed for moderate pain or severe pain. 04/07/17  Yes Michael [name], MD  levothyroxine (SYNTHROID, LEVOTHROID) 150 MCG tablet Take 150 mcg by mouth daily.   Yes [provider]  loperamide (IMODIUM) 2 MG capsule  Take 1-2 capsules (2-4 mg total) by mouth every 8 (eight) hours as needed for diarrhea or loose stools (Use if >2 BM every 8 hours). 04/07/17  Yes Michael [name], MD  loratadine (CLARITIN) 10 MG tablet Take 10 mg by mouth daily.    Yes [provider]  metFORMIN (GLUCOPHAGE) 1000 MG tablet Take 1,000 mg by mouth 2 (two) times daily with a meal.    Yes [provider]  methocarbamol (ROBAXIN) 500 MG tablet Take 500 mg by mouth every 4 (four) hours as needed for muscle spasms.  04/14/17  Yes [provider]  omega-3 acid ethyl esters (LOVAZA) 1 G capsule Take 1 g by mouth 2 (two) times daily.   Yes [provider]  ondansetron (ZOFRAN-ODT) 4 MG disintegrating tablet Take 4 mg by mouth every 6 (six) hours as needed for nausea or vomiting.  04/12/17  Yes [provider]  Polyethyl Glycol-Propyl Glycol (SYSTANE OP) Place 1 drop into both eyes at bedtime.    Yes [provider]  Prenatal Vit-Fe Fumarate-FA (PRENATAL VITAMIN PO) Take 1 capsule by mouth daily. Takes prenatal because there are no dyes in it   Yes [provider]  rosuvastatin (CRESTOR) 10 MG tablet Take 10 mg by mouth every evening.    Yes [provider]  sitaGLIPtin (JANUVIA) 100 MG tablet Take 100 mg by mouth every morning.    Yes [provider]  losartan (COZAAR) 100 MG tablet Take 1 tablet (100 mg total) by mouth daily. Patient not taking: Reported on 04/16/2017 03/18/17   Heath Lark, MD  Vital Signs: BP (!) 122/53 (BP Location: Right Wrist)   Pulse 65   Temp 98.6 F (37 C) (Oral)   Resp 16   Ht 5' 2.5" (1.588 m)   Wt 212 lb 3.2 oz (96.3 kg)   SpO2 99%   BMI 38.19 kg/m   Physical Exam  NAD, alert Abd: soft, nontender. Pelvic drain in place.  Insertion site is bloody from irritation.  Clear urine in suction bulb.  Imaging: No results found.  Labs:  CBC:  Recent Labs  04/19/17 0445 04/20/17 0510 04/21/17 0416 04/22/17 0430  WBC 28.4*  24.8* 25.4* 24.0*  HGB 8.3* 7.7* 7.7* 8.0*  HCT 25.3* 24.0* 24.4* 24.8*  PLT 439* 417* 411* 398    COAGS:  Recent Labs  04/16/17 0047  INR 1.21    BMP:  Recent Labs  04/19/17 0445 04/20/17 0510 04/21/17 0416 04/22/17 0430  NA 137 138 136 137  K 4.1 4.4 4.8 5.0  CL 103 104 103 104  CO2 26 26 26 25   GLUCOSE 156* 156* 161* 147*  BUN 13 13 17 20   CALCIUM 8.0* 8.1* 8.1* 8.3*  CREATININE 0.82 0.80 0.85 1.03*  GFRNONAA >60 >60 >60 57*  GFRAA >60 >60 >60 >60    LIVER FUNCTION TESTS:  Recent Labs  04/17/17 0529 04/18/17 0427 04/20/17 0510 04/21/17 0416  BILITOT 0.3 0.4 0.4 0.5  AST 14* 13* 13* 13*  ALT 12* 11* 10* 10*  ALKPHOS 202* 176* 177* 190*  PROT 5.5* 6.0* 5.8* 5.9*  ALBUMIN 1.7* 1.8* 1.8* 1.8*    Assessment and Plan:  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  Urology in to see patient today as well and plans to remove from suction. Per MD, plan for cystogram in urology office to evaluate for drain removal and they will contacts [place] if patient has further outpatient needs re: drain management. Drain site intact, some blood. WBC persistently elevated.  Getting IV abx via PICC.  Continue routine drain care.  Currently has TID drain flushes ordered.  IR to follow.   Electronically Signed: Docia Barrier, PA 04/22/2017, 10:34 AM   I spent a total of 15 Minutes at the the patient's bedside AND on the patient's hospital floor or unit, greater than 50% of which was counseling/coordinating care for urinoma.

## 2017-04-22 NOTE — Progress Notes (Signed)
Nutrition Follow-up  DOCUMENTATION CODES:   Obesity unspecified  INTERVENTION:  - Continue TPN per Pharmacy. - Continue to encourage PO intakes of meals and Ensure Surgery or Premier Protein BID. - Ileostomy diet education provided.   NUTRITION DIAGNOSIS:   Inadequate oral intake related to inability to eat as evidenced by NPO status. -resolving with diet advancement.   GOAL:   Patient will meet greater than or equal to 90% of their needs -met at this time.   MONITOR:   PO intake, Labs, Weight trends, I & O's (TPN)  REASON FOR ASSESSMENT:   Consult Diet education (Patient has a new ileostomy. Please education patient on ileostomy diet.)  ASSESSMENT:    61 y.o. female with a history of endometrial/ovarian cancer diagnosed in 2006, status post TAH/BSO and chemotherapy.   9/21 Per review of doc flow sheet, pt consumed 70% of breakfast (600 kcal and 20 grams of protein) and 80% of dinner (384 kcal and 23 grams of protein) yesterday and 50% of breakfast (165 kcal and 8 grams of protein) today. She states that she was able to eat all of lunch: peanut butter sandwich with chips and canned fruit but is having some slight nausea after this meal. Pt and husband, who is at bedside, report that pt was provided with diet education concerning ileostomy during previous admission but are very receptive to discussing this topic again. Husband has been doing the grocery shopping and is aware of the need to limit fiber. Pt reports that her appetite is slowly improving and that she tries not to over do it at meals so that nausea is not too severe. She denies baseline nausea, only experiences it after meals but it often subsides quickly. Pt was drinking Premier Protein during RD visit and states that she has this supplement at home.   She has a double lumen PICC and is currently receiving Clinimix E 5/15 @ 40 mL/hr with 20% ILE @ 20 mL/hr x12 hours. This regimen is providing 48 grams of protein and  1162 kcal. No new weight since 9/15.  Medications reviewed; 10000 units vitamin D weekly, 150 mcg oral Synthroid/day, daily prenatal vitamin.  Labs reviewed; CBGs: 138, 144, 116 mg/dL today, creatinine: 1.03 mg/dL, Ca: 8.3 mg/dL, GFR: 57 mL/min.   IVF: LR-20 mEq KCl @ 10 mL/hr.    9/20 - Patient drank 1 Premier Protein yesterday. No other supplements since.  - Pt is currently eating 70-75% of meals she is ordering. - Pt requested regular diet with MD still recommends soft diet.  - Since patient is tolerating solid diet, TPN is being weaned.  Plan per Pharmacy 9/20: Magnesium repletion 4gm IVPB this am At 1800 today:  ReduceClinimix E 5/15 torate of 40 ml/hr.  20% fat emulsion at 79m/hrover 12hr.   Diet Order:  Diet regular Room service appropriate? Yes; Fluid consistency: Thin  Skin:  Reviewed, no issues  Last BM:  9/20 via ileostomy  Height:   Ht Readings from Last 1 Encounters:  04/16/17 5' 2.5" (1.588 m)    Weight:   Wt Readings from Last 1 Encounters:  04/16/17 212 lb 3.2 oz (96.3 kg)    Ideal Body Weight:  52.3 kg  BMI:  Body mass index is 38.19 kg/m.  Estimated Nutritional Needs:   Kcal:  1600-1800  Protein:  65-75g  Fluid:  1.8L/day  EDUCATION NEEDS:   Education needs addressed    JJarome Matin MS, RD, LDN, CNSC Inpatient Clinical Dietitian Pager # [phone] hours/weekend pager #  [phone]

## 2017-04-23 LAB — CBC
HEMATOCRIT: 25.2 % — AB (ref 36.0–46.0)
Hemoglobin: 8.1 g/dL — ABNORMAL LOW (ref 12.0–15.0)
MCH: 27.9 pg (ref 26.0–34.0)
MCHC: 32.1 g/dL (ref 30.0–36.0)
MCV: 86.9 fL (ref 78.0–100.0)
PLATELETS: 362 10*3/uL (ref 150–400)
RBC: 2.9 MIL/uL — AB (ref 3.87–5.11)
RDW: 18 % — ABNORMAL HIGH (ref 11.5–15.5)
WBC: 15 10*3/uL — AB (ref 4.0–10.5)

## 2017-04-23 LAB — BASIC METABOLIC PANEL
ANION GAP: 10 (ref 5–15)
BUN: 22 mg/dL — ABNORMAL HIGH (ref 6–20)
CALCIUM: 8.4 mg/dL — AB (ref 8.9–10.3)
CO2: 25 mmol/L (ref 22–32)
Chloride: 104 mmol/L (ref 101–111)
Creatinine, Ser: 0.91 mg/dL (ref 0.44–1.00)
GLUCOSE: 113 mg/dL — AB (ref 65–99)
POTASSIUM: 5.3 mmol/L — AB (ref 3.5–5.1)
Sodium: 139 mmol/L (ref 135–145)

## 2017-04-23 NOTE — Progress Notes (Signed)
Patient ID: [name], female   DOB: 14-Jul-1956, 61 y.o.   MRN: [id number]   Pelvic abscess drain placed 9/15 Plan for possible DC today or tomorrow To follow with Urology for Urinoma  Placed order for OP IR [facility] follow up 5-7 days  Can call [phone] if questions

## 2017-04-23 NOTE — Progress Notes (Signed)
Subjective: Daysi is doing well today without complaints.  She has minimal JP drainage and good UOP.  Cr. Is 1.58.   ROS:  ROS  Anti-infectives: Anti-infectives    Start     Dose/Rate Route Frequency Ordered Stop   04/23/17 1000  vancomycin (VANCOCIN) 500 mg in sodium chloride 0.9 % 100 mL IVPB     500 mg 100 mL/hr over 60 Minutes Intravenous Every 12 hours 04/22/17 1833     04/22/17 2359  vancomycin (VANCOCIN) 500 mg in sodium chloride 0.9 % 100 mL IVPB  Status:  Discontinued     500 mg 100 mL/hr over 60 Minutes Intravenous Every 12 hours 04/22/17 1457 04/22/17 1831   04/22/17 2359  vancomycin (VANCOCIN) 500 mg in sodium chloride 0.9 % 100 mL IVPB     500 mg 100 mL/hr over 60 Minutes Intravenous Every 12 hours 04/22/17 1831 04/23/17 0056   04/18/17 2200  aztreonam (AZACTAM) 2 GM IVPB     2 g 100 mL/hr over 30 Minutes Intravenous Every 8 hours 04/18/17 1913     04/18/17 1000  fluconazole (DIFLUCAN) IVPB 200 mg  Status:  Discontinued     200 mg 100 mL/hr over 60 Minutes Intravenous Every 24 hours 04/18/17 0836 04/18/17 1448   04/16/17 2200  vancomycin (VANCOCIN) IVPB 1000 mg/200 mL premix  Status:  Discontinued     1,000 mg 200 mL/hr over 60 Minutes Intravenous Every 12 hours 04/16/17 0917 04/16/17 1154   04/16/17 2200  vancomycin (VANCOCIN) IVPB 750 mg/150 ml premix  Status:  Discontinued     750 mg 150 mL/hr over 60 Minutes Intravenous Every 12 hours 04/16/17 1154 04/22/17 1457   04/16/17 1800  vancomycin (VANCOCIN) IVPB 750 mg/150 ml premix  Status:  Discontinued     750 mg 150 mL/hr over 60 Minutes Intravenous Every 12 hours 04/16/17 0359 04/16/17 0652   04/16/17 1245  metroNIDAZOLE (FLAGYL) IVPB 500 mg  Status:  Discontinued     500 mg 100 mL/hr over 60 Minutes Intravenous Every 8 hours 04/16/17 1244 04/16/17 1258   04/16/17 0930  vancomycin (VANCOCIN) IVPB 1000 mg/200 mL premix     1,000 mg 200 mL/hr over 60 Minutes Intravenous  Once 04/16/17 0911 04/16/17 1043    04/16/17 0600  aztreonam (AZACTAM) injection 2 g  Status:  Discontinued     2 g Intramuscular Every 8 hours 04/16/17 0354 04/16/17 0355   04/16/17 0400  metroNIDAZOLE (FLAGYL) IVPB 500 mg  Status:  Discontinued     500 mg 100 mL/hr over 60 Minutes Intravenous Every 8 hours 04/16/17 0354 04/18/17 0815   04/16/17 0400  vancomycin (VANCOCIN) IVPB 1000 mg/200 mL premix  Status:  Discontinued     1,000 mg 200 mL/hr over 60 Minutes Intravenous  Once 04/16/17 0354 04/16/17 0911   04/16/17 0400  aztreonam (AZACTAM) 2 g in dextrose 5 % 50 mL IVPB  Status:  Discontinued     2 g 100 mL/hr over 30 Minutes Intravenous Every 8 hours 04/16/17 0356 04/18/17 1913   04/16/17 0345  gentamicin (GARAMYCIN) 240 mg in dextrose 5 % 50 mL IVPB  Status:  Discontinued     2.5 mg/kg  96.3 kg 112 mL/hr over 30 Minutes Intravenous Every 12 hours 04/16/17 0342 04/16/17 0354      Current Facility-Administered Medications  Medication Dose Route Frequency Provider Last Rate Last Dose  . alum & mag hydroxide-simeth (MAALOX/MYLANTA) 200-200-20 MG/5ML suspension 30 mL  30 mL Oral Q6H PRN Michael [name], MD      .  anastrozole (ARIMIDEX) tablet 1 mg  1 mg Oral Daily Michael [name], MD   1 mg at 04/22/17 0923  . aspirin EC tablet 81 mg  81 mg Oral Daily Michael [name], MD   81 mg at 04/22/17 0925  . aztreonam (AZACTAM) 2 GM IVPB  2 g Intravenous Tor Netters, MD   Stopped at 04/23/17 0201  . bismuth subsalicylate (PEPTO BISMOL) 262 MG/15ML suspension 30 mL  30 mL Oral Q8H PRN Michael [name], MD      . chlorproMAZINE (THORAZINE) 25 mg in sodium chloride 0.9 % 25 mL IVPB  25 mg Intravenous Q6H PRN Michael [name], MD      . Derrill Memo ON 04/24/2017] cholecalciferol (VITAMIN D) tablet 10,000 Units  10,000 Units Oral Weekly Michael [name], MD      . diphenhydrAMINE (BENADRYL) injection 12.5-25 mg  12.5-25 mg Intravenous Q6H PRN Michael [name], MD   25 mg at 04/21/17 0110  . enoxaparin (LOVENOX) injection 40 mg  40 mg Subcutaneous Q24H  Michael [name], MD   40 mg at 04/22/17 0926  . feeding supplement (ENSURE SURGERY) liquid 237 mL  237 mL Oral BID BM Michael [name], MD      . guaiFENesin-dextromethorphan (ROBITUSSIN DM) 100-10 MG/5ML syrup 10 mL  10 mL Oral Q4H PRN Michael [name], MD      . hydrALAZINE (APRESOLINE) injection 5-20 mg  5-20 mg Intravenous Q6H PRN Michael [name], MD      . hydrocortisone (ANUSOL-HC) 2.5 % rectal cream 1 application  1 application Topical QID PRN Michael [name], MD      . hydrocortisone cream 1 % 1 application  1 application Topical BID Michael [name], MD   1 application at 17/91/50 2222  . HYDROmorphone (DILAUDID) injection 1 mg  1 mg Intravenous Q2H PRN Michael [name], MD      . HYDROmorphone (DILAUDID) tablet 2-4 mg  2-4 mg Oral Q4H PRN Michael [name], MD   4 mg at 04/23/17 0509  . lactated ringers 1,000 mL with potassium chloride 20 mEq infusion   Intravenous Continuous Michael [name], MD 10 mL/hr at 04/23/17 0201    . levothyroxine (SYNTHROID, LEVOTHROID) tablet 150 mcg  150 mcg Oral QAC breakfast Michael [name], MD   150 mcg at 04/22/17 0924  . linagliptin (TRADJENTA) tablet 5 mg  5 mg Oral Daily Michael [name], MD   5 mg at 04/22/17 0924  . lip balm (CARMEX) ointment 1 application  1 application Topical BID Michael [name], MD   1 application at 56/97/94 1000  . loperamide (IMODIUM) capsule 2 mg  2 mg Oral Ardeen Fillers, MD   2 mg at 04/22/17 2152  . loperamide (IMODIUM) capsule 2-4 mg  2-4 mg Oral Q8H PRN Michael [name], MD      . loratadine (CLARITIN) tablet 10 mg  10 mg Oral Daily Michael [name], MD   10 mg at 04/22/17 8016  . LORazepam (ATIVAN) injection 0.5-1 mg  0.5-1 mg Intravenous Q8H PRN Michael [name], MD      . magic mouthwash  15 mL Oral QID PRN Michael [name], MD      . menthol-cetylpyridinium (CEPACOL) lozenge 3 mg  1 lozenge Oral PRN Michael [name], MD      . methocarbamol (ROBAXIN) 1,000 mg in dextrose 5 % 50 mL IVPB  1,000 mg Intravenous Q6H PRN Michael [name], MD   Stopped at 04/20/17 1211   . methocarbamol (ROBAXIN) tablet 500 mg  500 mg Oral Q4H PRN Michael [name], MD   500  mg at 04/22/17 1043  . metoCLOPramide (REGLAN) injection 5-10 mg  5-10 mg Intravenous Q6H PRN Michael [name], MD   10 mg at 04/22/17 1328  . metoprolol tartrate (LOPRESSOR) injection 5 mg  5 mg Intravenous Q6H PRN Michael [name], MD      . ondansetron (ZOFRAN-ODT) disintegrating tablet 4 mg  4 mg Oral Q6H PRN Excell Seltzer, MD   4 mg at 04/23/17 0509   Or  . ondansetron (ZOFRAN) injection 4 mg  4 mg Intravenous Q6H PRN Excell Seltzer, MD   4 mg at 04/21/17 0926  . phenol (CHLORASEPTIC) mouth spray 1-2 spray  1-2 spray Mouth/Throat PRN Michael [name], MD      . prenatal multivitamin tablet 1 tablet  1 tablet Oral Q1200 Michael [name], MD   1 tablet at 04/22/17 1329  . prochlorperazine (COMPAZINE) injection 5-10 mg  5-10 mg Intravenous Q4H PRN Michael [name], MD      . protein supplement (PREMIER PROTEIN) liquid  11 oz Oral BID BM Michael [name], MD   11 oz at 04/22/17 1700  . rosuvastatin (CRESTOR) tablet 10 mg  10 mg Oral QPM Michael [name], MD   10 mg at 04/22/17 1802  . sodium chloride (OCEAN) 0.65 % nasal spray 1 spray  1 spray Each Nare PRN Stark Klein, MD      . sodium chloride flush (NS) 0.9 % injection 10-40 mL  10-40 mL Intracatheter PRN Excell Seltzer, MD   10 mL at 04/22/17 1132  . sodium chloride flush (NS) 0.9 % injection 5 mL  5 mL Intravenous Lovena Neighbours, MD   5 mL at 04/23/17 0513  . Tbo-Filgrastim (GRANIX) injection 480 mcg  480 mcg Subcutaneous 6 days Excell Seltzer, MD   480 mcg at 04/18/17 1832  . vancomycin (VANCOCIN) 500 mg in sodium chloride 0.9 % 100 mL IVPB  500 mg Intravenous Q12H Emiliano Dyer, RPH         Objective: Vital signs in last 24 hours: Temp:  [98.3 F (36.8 C)-98.8 F (37.1 C)] 98.3 F (36.8 C) (09/22 0515) Pulse Rate:  [67-75] 74 (09/22 0515) Resp:  [16-18] 18 (09/22 0515) BP: (114-138)/(53-69) 117/56 (09/22 0515) SpO2:  [95 %-100 %] 95 % (09/22  0515)  Intake/Output from previous day: 09/21 0701 - 09/22 0700 In: 078 [P.O.:720; I.V.:160; IV Piggyback:50] Out: 6754 [Urine:2450; Drains:20; Stool:910] Intake/Output this shift: No intake/output data recorded.   Physical Exam   Urine minimally turbid.  Lab Results:   Recent Labs  04/22/17 0430 04/23/17 0431  WBC 24.0* 15.0*  HGB 8.0* 8.1*  HCT 24.8* 25.2*  PLT 398 362   BMET  Recent Labs  04/22/17 0430 04/23/17 0431  NA 137 139  K 5.0 5.3*  CL 104 104  CO2 25 25  GLUCOSE 147* 113*  BUN 20 22*  CREATININE 1.03* 0.91  CALCIUM 8.3* 8.4*   PT/INR No results for input(s): LABPROT, INR in the last 72 hours. ABG No results for input(s): PHART, HCO3 in the last 72 hours.  Invalid input(s): PCO2, PO2  Studies/Results: No results found.   Assessment and Plan: S/P Right reimplant with urinoma at time of colostomy take down.   Doing well with minimal drain output.   Foley draining well.   [place] for DC from GU standpoint.       LOS: 7 days    Malka So 04/23/2017 [phone] ID: Doretha Imus, female   DOB: January 31, 1956, 61 y.o.   MRN: [id number]

## 2017-04-23 NOTE — Progress Notes (Signed)
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  Waiting for ID recommendations for HH abxs and duration.   LOS: 7 days        Chief Complaint/Subjective: Feeling pretty good this AM.  Objective: Vital signs in last 24 hours: Temp:  [98.3 F (36.8 C)-98.8 F (37.1 C)] 98.3 F (36.8 C) (09/22 0515) Pulse Rate:  [67-75] 74 (09/22 0515) Resp:  [16-18] 18 (09/22 0515) BP: (114-138)/(53-69) 117/56 (09/22 0515) SpO2:  [95 %-100 %] 95 % (09/22 0515) Last BM Date: 04/23/17  Intake/Output from previous day: 09/21 0701 - 09/22 0700 In: 226 [P.O.:720; I.V.:160; IV Piggyback:50] Out: 54 [Urine:2450; Drains:20; Stool:910] Intake/Output this shift: Total I/O In: 240 [P.O.:240] Out: -   PE: General- In NAD.  Awake and alert. Abdomen-soft, wounds clean, loop ileostomy viable and working with some retraction, yellow fluid in drain  Lab Results:   Recent Labs  04/22/17 0430 04/23/17 0431  WBC 24.0* 15.0*  HGB 8.0* 8.1*  HCT 24.8* 25.2*  PLT 398 362   BMET  Recent Labs  04/22/17 0430 04/23/17 0431  NA 137 139  K 5.0 5.3*  CL 104 104  CO2 25 25  GLUCOSE 147* 113*  BUN 20 22*  CREATININE 1.03* 0.91  CALCIUM 8.3* 8.4*   PT/INR No results for input(s): LABPROT, INR in the last 72 hours. Comprehensive Metabolic Panel:    Component Value Date/Time   NA 139 04/23/2017 0431   NA 137 04/22/2017 0430   NA 141  01/24/2017 1228   NA 141 10/07/2016 0929   K 5.3 (H) 04/23/2017 0431   K 5.0 04/22/2017 0430   K 4.6 01/24/2017 1228   K 4.5 10/07/2016 0929   CL 104 04/23/2017 0431   CL 104 04/22/2017 0430   CO2 25 04/23/2017 0431   CO2 25 04/22/2017 0430   CO2 29 01/24/2017 1228   CO2 30 (H) 10/07/2016 0929   BUN 22 (H) 04/23/2017 0431   BUN 20 04/22/2017 0430   BUN 22.2 01/24/2017 1228   BUN 23.6 10/07/2016 0929   CREATININE 0.91 04/23/2017 0431   CREATININE 1.03 (H) 04/22/2017 0430   CREATININE 0.8 01/24/2017 1228   CREATININE 1.0 10/07/2016 0929   GLUCOSE 113 (H) 04/23/2017 0431   GLUCOSE 147 (H) 04/22/2017 0430   GLUCOSE 84 01/24/2017 1228   GLUCOSE 108 10/07/2016 0929   CALCIUM 8.4 (L) 04/23/2017 0431   CALCIUM 8.3 (L) 04/22/2017 0430   CALCIUM 10.2 01/24/2017 1228   CALCIUM 10.7 (H) 10/07/2016 0929   AST 13 (L) 04/21/2017 0416   AST 13 (L) 04/20/2017 0510   AST 16 01/24/2017 1228   AST 15 10/07/2016 0929   ALT 10 (L) 04/21/2017 0416   ALT 10 (L) 04/20/2017 0510   ALT 14 01/24/2017 1228   ALT 16 10/07/2016 0929   ALKPHOS 190 (H) 04/21/2017 0416   ALKPHOS 177 (H) 04/20/2017 0510   ALKPHOS 90 01/24/2017 1228  ALKPHOS 123 10/07/2016 0929   BILITOT 0.5 04/21/2017 0416   BILITOT 0.4 04/20/2017 0510   BILITOT 0.34 01/24/2017 1228   BILITOT 0.31 10/07/2016 0929   PROT 5.9 (L) 04/21/2017 0416   PROT 5.8 (L) 04/20/2017 0510   PROT 6.9 01/24/2017 1228   PROT 8.2 10/07/2016 0929   ALBUMIN 1.8 (L) 04/21/2017 0416   ALBUMIN 1.8 (L) 04/20/2017 0510   ALBUMIN 3.4 (L) 01/24/2017 1228   ALBUMIN 3.8 10/07/2016 0929     Studies/Results: No results found.  Anti-infectives: Anti-infectives    Start     Dose/Rate Route Frequency Ordered Stop   04/23/17 1000  vancomycin (VANCOCIN) 500 mg in sodium chloride 0.9 % 100 mL IVPB     500 mg 100 mL/hr over 60 Minutes Intravenous Every 12 hours 04/22/17 1833     04/22/17 2359  vancomycin (VANCOCIN) 500 mg in sodium chloride 0.9 % 100 mL IVPB   Status:  Discontinued     500 mg 100 mL/hr over 60 Minutes Intravenous Every 12 hours 04/22/17 1457 04/22/17 1831   04/22/17 2359  vancomycin (VANCOCIN) 500 mg in sodium chloride 0.9 % 100 mL IVPB     500 mg 100 mL/hr over 60 Minutes Intravenous Every 12 hours 04/22/17 1831 04/23/17 0056   04/18/17 2200  aztreonam (AZACTAM) 2 GM IVPB     2 g 100 mL/hr over 30 Minutes Intravenous Every 8 hours 04/18/17 1913     04/18/17 1000  fluconazole (DIFLUCAN) IVPB 200 mg  Status:  Discontinued     200 mg 100 mL/hr over 60 Minutes Intravenous Every 24 hours 04/18/17 0836 04/18/17 1448   04/16/17 2200  vancomycin (VANCOCIN) IVPB 1000 mg/200 mL premix  Status:  Discontinued     1,000 mg 200 mL/hr over 60 Minutes Intravenous Every 12 hours 04/16/17 0917 04/16/17 1154   04/16/17 2200  vancomycin (VANCOCIN) IVPB 750 mg/150 ml premix  Status:  Discontinued     750 mg 150 mL/hr over 60 Minutes Intravenous Every 12 hours 04/16/17 1154 04/22/17 1457   04/16/17 1800  vancomycin (VANCOCIN) IVPB 750 mg/150 ml premix  Status:  Discontinued     750 mg 150 mL/hr over 60 Minutes Intravenous Every 12 hours 04/16/17 0359 04/16/17 0652   04/16/17 1245  metroNIDAZOLE (FLAGYL) IVPB 500 mg  Status:  Discontinued     500 mg 100 mL/hr over 60 Minutes Intravenous Every 8 hours 04/16/17 1244 04/16/17 1258   04/16/17 0930  vancomycin (VANCOCIN) IVPB 1000 mg/200 mL premix     1,000 mg 200 mL/hr over 60 Minutes Intravenous  Once 04/16/17 0911 04/16/17 1043   04/16/17 0600  aztreonam (AZACTAM) injection 2 g  Status:  Discontinued     2 g Intramuscular Every 8 hours 04/16/17 0354 04/16/17 0355   04/16/17 0400  metroNIDAZOLE (FLAGYL) IVPB 500 mg  Status:  Discontinued     500 mg 100 mL/hr over 60 Minutes Intravenous Every 8 hours 04/16/17 0354 04/18/17 0815   04/16/17 0400  vancomycin (VANCOCIN) IVPB 1000 mg/200 mL premix  Status:  Discontinued     1,000 mg 200 mL/hr over 60 Minutes Intravenous  Once 04/16/17 0354 04/16/17  0911   04/16/17 0400  aztreonam (AZACTAM) 2 g in dextrose 5 % 50 mL IVPB  Status:  Discontinued     2 g 100 mL/hr over 30 Minutes Intravenous Every 8 hours 04/16/17 0356 04/18/17 1913   04/16/17 0345  gentamicin (GARAMYCIN) 240 mg in dextrose 5 % 50 mL IVPB  Status:  Discontinued     2.5 mg/kg  96.3 kg 112 mL/hr over 30 Minutes Intravenous Every 12 hours 04/16/17 0342 04/16/17 0354       Escarlet Saathoff J 04/23/2017

## 2017-04-24 MED ORDER — VITAMIN D3 25 MCG (1000 UNIT) PO TABS: 10000.0000 [IU] | ORAL_TABLET | ORAL | Status: DC

## 2017-04-24 MED ORDER — VITAMIN D3 250 MCG (10000 UT) PO CAPS: 10000.0000 [IU] | ORAL_CAPSULE | ORAL | Status: DC

## 2017-04-24 NOTE — Progress Notes (Signed)
PT Note  Patient Details Name: [name] MRN: [id number] DOB: 10/24/1955        chart reviewed and spoke with pt and pt's husband. Pt has been walking around unit multiple times a day with husband by her side, tolerating that well and getting in and out of bed better per pt report. No needs for PT for mobility seen by pt at this time.   Reviewed to side roll before coming to sit to minimize abdominal pressure and same sequence for sit to side lying to supine. Patient and husband aware.   Not PT needs at this time. No PT follow up needs either. Signing off.  Thanks you  Clide Dales 04/24/2017, 5:14 PM  Clide Dales, PT Pager: [phone] 04/24/2017

## 2017-04-24 NOTE — Progress Notes (Signed)
[facility]:  [phone] General Surgery Progress Note   LOS: 8 days  POD -     Chief Complaint: Ileostomy  Assessment and Plan: 1.  ROBOTIC LYSIS OF ADHESIONS x 5 hours, ROBOTIC COLOSTOMY TAKEDOWN,   ROBOTIC SEWN COLOANAL ANASTOMOSIS, Ileocecal resection with anastomosis, Diverting loop ileostomy - 04/01/2017 - S. Gross  Ureteral stricture, right, s/p resection & bladder hitch reimplantation - T. Manny - 04/01/2017  Azactam  Looks good, was hoping to go home over the weekend.  But will stay till Monday to go over discharge plan and management of tubes.  2.  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  History of endometrioid cancer of the ovary.   Underwent hysterectomy and salpingo-oophorectomy and possible intraperitoneal chemotherapy in 10/28/2004 by Dr. Rhodia Albright at [facility].  4.  Anemia - Hgb - 8.1 - 04/24/2015  5.  DVT prophylaxis - Lovenox 6.  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 Active Problems:   Chronic neutropenia (HCC)   DM type 2 (diabetes mellitus, type 2) (HCC)   Benign essential HTN   History of ovarian & endometrial cancer   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 s/p resection/partial vaginectomy/ LAR/colostomy 11/19/2014   Morbid obesity (HCC)   Pancytopenia, acquired ([place])   Chronic anemia   Breast cancer of upper-inner quadrant of left female breast ([place])   Ureteral stricture, right, s/p resection & bladder hitch reimplantation 04/01/2017   Hot flashes related to aromatase inhibitor therapy   Pelvic cancer s/p colostomy takedown/loop ileostomy diversion 04/01/2017   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  Ileostomy more "solid" than normal. Husband in the room.  Objective:   Vitals:   04/23/17 2159 04/24/17 0559  BP: (!) 118/57 130/71  Pulse: 72 73   Resp: 16 16  Temp: 98.5 F (36.9 C) 99.1 F (37.3 C)  SpO2: 97% 97%     Intake/Output from previous day:  09/22 0701 - 09/23 0700 In: [place] [P.O.:1080; I.V.:240; IV Piggyback:450] Out: 4742 [Urine:3585; Drains:36; Stool:800]  Intake/Output this shift:  Total I/O In: 120 [P.O.:120] Out: 650 [Urine:375; Stool:275]   Physical Exam:   General: WN WF who is alert and oriented.    HEENT: Normal. Pupils equal. .   Lungs: Clear   Abdomen: Soft   Wound: RLQ ostomy with good output.  Drain in suprapubic area.   Lab Results:    Recent Labs  04/22/17 0430 04/23/17 0431  WBC 24.0* 15.0*  HGB 8.0* 8.1*  HCT 24.8* 25.2*  PLT 398 362    BMET   Recent Labs  04/22/17 0430 04/23/17 0431  NA 137 139  K 5.0 5.3*  CL 104 104  CO2 25 25  GLUCOSE 147* 113*  BUN 20 22*  CREATININE 1.03* 0.91  CALCIUM 8.3* 8.4*    PT/INR  No results for input(s): LABPROT, INR in the last 72 hours.  ABG  No results for input(s): PHART, HCO3 in the last 72 hours.  Invalid input(s): PCO2, PO2   Studies/Results:  No results found.   Anti-infectives:   Anti-infectives    Start     Dose/Rate Route Frequency Ordered Stop   04/23/17 1000  vancomycin (VANCOCIN) 500 mg in sodium chloride 0.9 % 100 mL IVPB     500 mg 100 mL/hr over 60 Minutes Intravenous Every 12  hours 04/22/17 1833     04/22/17 2359  vancomycin (VANCOCIN) 500 mg in sodium chloride 0.9 % 100 mL IVPB  Status:  Discontinued     500 mg 100 mL/hr over 60 Minutes Intravenous Every 12 hours 04/22/17 1457 04/22/17 1831   04/22/17 2359  vancomycin (VANCOCIN) 500 mg in sodium chloride 0.9 % 100 mL IVPB     500 mg 100 mL/hr over 60 Minutes Intravenous Every 12 hours 04/22/17 1831 04/23/17 0056   04/18/17 2200  aztreonam (AZACTAM) 2 GM IVPB     2 g 100 mL/hr over 30 Minutes Intravenous Every 8 hours 04/18/17 1913     04/18/17 1000  fluconazole (DIFLUCAN) IVPB 200 mg  Status:  Discontinued     200 mg 100 mL/hr over 60 Minutes Intravenous  Every 24 hours 04/18/17 0836 04/18/17 1448   04/16/17 2200  vancomycin (VANCOCIN) IVPB 1000 mg/200 mL premix  Status:  Discontinued     1,000 mg 200 mL/hr over 60 Minutes Intravenous Every 12 hours 04/16/17 0917 04/16/17 1154   04/16/17 2200  vancomycin (VANCOCIN) IVPB 750 mg/150 ml premix  Status:  Discontinued     750 mg 150 mL/hr over 60 Minutes Intravenous Every 12 hours 04/16/17 1154 04/22/17 1457   04/16/17 1800  vancomycin (VANCOCIN) IVPB 750 mg/150 ml premix  Status:  Discontinued     750 mg 150 mL/hr over 60 Minutes Intravenous Every 12 hours 04/16/17 0359 04/16/17 0652   04/16/17 1245  metroNIDAZOLE (FLAGYL) IVPB 500 mg  Status:  Discontinued     500 mg 100 mL/hr over 60 Minutes Intravenous Every 8 hours 04/16/17 1244 04/16/17 1258   04/16/17 0930  vancomycin (VANCOCIN) IVPB 1000 mg/200 mL premix     1,000 mg 200 mL/hr over 60 Minutes Intravenous  Once 04/16/17 0911 04/16/17 1043   04/16/17 0600  aztreonam (AZACTAM) injection 2 g  Status:  Discontinued     2 g Intramuscular Every 8 hours 04/16/17 0354 04/16/17 0355   04/16/17 0400  metroNIDAZOLE (FLAGYL) IVPB 500 mg  Status:  Discontinued     500 mg 100 mL/hr over 60 Minutes Intravenous Every 8 hours 04/16/17 0354 04/18/17 0815   04/16/17 0400  vancomycin (VANCOCIN) IVPB 1000 mg/200 mL premix  Status:  Discontinued     1,000 mg 200 mL/hr over 60 Minutes Intravenous  Once 04/16/17 0354 04/16/17 0911   04/16/17 0400  aztreonam (AZACTAM) 2 g in dextrose 5 % 50 mL IVPB  Status:  Discontinued     2 g 100 mL/hr over 30 Minutes Intravenous Every 8 hours 04/16/17 0356 04/18/17 1913   04/16/17 0345  gentamicin (GARAMYCIN) 240 mg in dextrose 5 % 50 mL IVPB  Status:  Discontinued     2.5 mg/kg  96.3 kg 112 mL/hr over 30 Minutes Intravenous Every 12 hours 04/16/17 0342 04/16/17 0354      Alphonsa Overall, MD, FACS Pager: [facility]: [phone] 04/24/2017

## 2017-04-25 ENCOUNTER — Other Ambulatory Visit (HOSPITAL_COMMUNITY): Payer: Self-pay | Admitting: Radiology

## 2017-04-25 ENCOUNTER — Other Ambulatory Visit: Payer: Self-pay | Admitting: Surgery

## 2017-04-25 DIAGNOSIS — Z960 Presence of urogenital implants: Secondary | ICD-10-CM | POA: Diagnosis not present

## 2017-04-25 DIAGNOSIS — N739 Female pelvic inflammatory disease, unspecified: Secondary | ICD-10-CM

## 2017-04-25 DIAGNOSIS — Z932 Ileostomy status: Secondary | ICD-10-CM | POA: Diagnosis not present

## 2017-04-25 DIAGNOSIS — J9601 Acute respiratory failure with hypoxia: Secondary | ICD-10-CM | POA: Diagnosis not present

## 2017-04-25 DIAGNOSIS — Z452 Encounter for adjustment and management of vascular access device: Secondary | ICD-10-CM | POA: Diagnosis not present

## 2017-04-25 DIAGNOSIS — E860 Dehydration: Secondary | ICD-10-CM | POA: Diagnosis not present

## 2017-04-25 MED ORDER — VANCOMYCIN IV (FOR PTA / DISCHARGE USE ONLY): 500.0000 mg | Freq: Two times a day (BID) | INTRAVENOUS | 0 refills | Status: DC

## 2017-04-25 MED ORDER — INFLUENZA VAC SPLIT HIGH-DOSE 0.5 ML IM SUSY
0.5000 mL | PREFILLED_SYRINGE | Freq: Once | INTRAMUSCULAR | Status: AC
  Administered 2017-04-25: 0.5 mL via INTRAMUSCULAR
  Filled 2017-04-25: qty 0.5

## 2017-04-25 MED ORDER — AZTREONAM IN DEXTROSE 2 GM/50ML IV SOLN
2.0000 g | Freq: Three times a day (TID) | INTRAVENOUS | 0 refills | Status: DC
Start: 2017-04-25 — End: 2017-05-19

## 2017-04-25 MED ORDER — HEPARIN SOD (PORK) LOCK FLUSH 100 UNIT/ML IV SOLN
250.0000 [IU] | INTRAVENOUS | Status: DC | PRN
  Administered 2017-04-25: 500 [IU]

## 2017-04-25 NOTE — Consult Note (Signed)
[facility] Nurse ostomy follow up Stoma type/location: RLQ ileostomy. Dr. Arletha Pili in to see patient during my encounter on behalf of Dr. Tresa Moore and [facility] and review discharge instructions with patient and her husband. Stomal assessment/size: 1 and 3/8 inches round, some avascular tissue continues to slough. When moved aside, stoma tissue (red, moist) is seen. Expect ostomy to be flush or slightly retracted. Peristomal assessment: intact, clear Treatment options for stomal/peristomal skin: skin barrier ring Output: liquid brown effluent Ostomy pouching: 2pc. Flat pouching system with skin barrier ring.  I will contact [employer] today for provision of 4 convex skin barriers that will correspond in size to her 2 and 1/4 inch pouches. Education provided: Patient and husband taught about continued sloughing of nonviable tissue and that stoma will be flush or retracted.  Taught the principle of convexity and that supplies would be arriving in the event they are needed downstream. I will also request an ostomy belt for added convexity. Taught that to give the skin a "rest" she may wish to alternate between a "square" placement of the skin barrier and a "diamond". Patient and her husband have my contact information in the event pouching becomes problematic.  Finally, we discuss reanastomosis surgery and she is reminded of her personal strength and that while yes, that is something down the road, her primary focus is on rest and recovery now. Patient is ready for discharge to the care of her husband, with assistance from the [facility] and with follow up from Drs. Manny and Gross. Enrolled patient in [place] [facility] Discharge program: Yes [place] nursing team will not follow routinely after discharge, but will remain available to this patient, the nursing and medical teams.  Please re-consult if needed. Thanks, Maudie Flakes, MSN, RN, [place], Arther Abbott  Pager# [phone]

## 2017-04-25 NOTE — Progress Notes (Signed)
(  error- see d/c note)

## 2017-04-25 NOTE — Progress Notes (Signed)
Pt was discharged home today. Instructions were reviewed with patient, and questions were answered. Pt was taken to main entrance via wheelchair by NT.  

## 2017-04-25 NOTE — Discharge Summary (Addendum)
Physician Discharge Summary  Patient ID: [name] MRN: [id number] DOB/AGE: May 01, 1956  61 y.o.  Admit date: 04/15/2017 Discharge date: 04/25/2017   Patient Care Team: Carollee Herter, Alferd Apa, DO as PCP - General (Family Medicine) Heath Lark, MD as Consulting Physician (Hematology and Oncology) Everitt Amber, MD as Consulting Physician (Obstetrics and Gynecology) Michael [name], MD as Consulting Physician (General Surgery) Alexis Frock, MD as Consulting Physician (Urology) Altheimer, Legrand Como, MD as Consulting Physician (Endocrinology) Katy Apo, MD as Consulting Physician (Ophthalmology) Stark Klein, MD as Consulting Physician (General Surgery) Irene Limbo, MD as Consulting Physician (Plastic Surgery)  Discharge Diagnoses:  Principal Problem:   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 Active Problems:   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 s/p resection/partial vaginectomy/ LAR/colostomy 11/19/2014   Ureteral stricture, right, s/p resection & bladder hitch reimplantation 04/01/2017   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   Chronic neutropenia ([place])   DM type 2 (diabetes mellitus, type 2) ([place])   Benign essential HTN   History of ovarian & endometrial cancer   Morbid obesity ([place])   Pancytopenia, acquired ([place])   Chronic anemia   Breast cancer of upper-inner quadrant of left female breast ([place])   Hot flashes related to aromatase inhibitor therapy   Pelvic cancer s/p colostomy takedown/loop ileostomy diversion 04/01/2017   Postoperative anemia   Pelvic abscess in female   Ileus ([place])   Protein-calorie malnutrition, moderate ([place])        POST-OPERATIVE DIAGNOSIS:   * No surgery found *  SURGERY:      SURGEON:    * Surgery not found *  Consults:  UROLOGY INF DISEASE  Hospital Course:   The patient underwent te surgery above.  Postoperatively, the patient gradually mobilized and advanced to a solid diet.  Pain and other symptoms were treated aggressively.     Complex lysis of adhesions and colostomy takedown with protective loop ileostomy.  Also had so as bladder hitch and distal ureteral resection and right ureteral reimplantation.  Transient recovering went home.  Unfortunately, she came in with pain and an ileus.  Found to have a fluid collection near her bladder anastomosis.  Drain placed.  Elevated creatinine.  Consistent with urinoma.  Some question of bile staining to it as well which quickly resolved.  She was admitted.  Placed on IV nutrition.  Placed on IV antibiotics.  She had a rash to fluconazole at home from her prior complaint of thrush.  That was stopped.  Rash resolved.  She grew out Serratia and Enterococcus.  Mostly pansensitive.  However she has many antibiotic allergies.  Infectious disease was consulted.  It was recommended she continue vancomycin and aztreonam for a 6 week course.  She had a PICC line catheter placed. She will complete the last 4 weeks as an outpatient.   Her proximal limb of her diverting loop ileostomy auto amputated due to distal necrosis.  Ileostomy more flush but able to be pouched.  WOCN followed with me.  Her bowels opened up and thickened up.  Tolerated nasogastric tube clamping t  Eventually remod.She advanced her diet.  She really wish to go home & avoid a SNF.  Her husband is staying home to help manage with HH.  Home health has been arranged for management of her drain in the urinoma, Foley catheter.  Also PICC line with IV antibiotics and IV fluid infusions.  Plan is to complete IV Vancomycin & Aztreonam on 19 October.  Continue IV fluid infusions 1 L every Monday Wednesday Friday  for 6 weeks.  Lab every week for the first 3 weeks.  Return to clinic to see me in 2 weeks.  Continue the percutaneous drain in the urinoma.  Follow-up with Dr. Tresa Moore in 10 days.  Most likely he'll send fluid off for creatinine or perhaps a drain study.  Remove that when urine leak has sealed and resolved.  Only then will he   likely remove the Foley catheter later.  Discharged Condition: fair  Disposition:  Follow-up Information    Alexis Frock, MD. Schedule an appointment as soon as possible for a visit in 2 week(s).   Specialty:  Urology Contact information: [place] [address] [phone]        Sandi Mariscal, MD Follow up in 6 day(s).   Specialty:  Interventional Radiology Why:  follw up in [facility] ; she will hear from scheduler for time and date- call [phone] if questions or concerns Contact information: [address] [place] [address] [phone]        Michael [name], MD. Schedule an appointment as soon as possible for a visit in 2 week(s).   Specialty:  General Surgery Contact information: [address] [place] [address] [phone]           06-Home-Health Care Svc  Discharge Instructions    Call MD for:    Complete by:  As directed    FEVER > 101.5 F  (temperatures < 101.5 F are not significant)   Call MD for:  extreme fatigue    Complete by:  As directed    Call MD for:  persistant dizziness or light-headedness    Complete by:  As directed    Call MD for:  persistant nausea and vomiting    Complete by:  As directed    Call MD for:  redness, tenderness, or signs of infection (pain, swelling, redness, odor or green/yellow discharge around incision site)    Complete by:  As directed    Call MD for:  severe uncontrolled pain    Complete by:  As directed    Diet - low sodium heart healthy    Complete by:  As directed    Follow a light diet the first few days at home.   Start with a bland diet such as soups, liquids, starchy foods, low fat foods, etc.   If you feel full, bloated, or constipated, stay on a full liquid or pureed/blenderized diet for a few days until you feel better and no longer constipated. Be sure to drink plenty of fluids every day to avoid getting dehydrated (feeling dizzy, not urinating,  etc.). Gradually add a fiber supplement to your diet   Discharge instructions    Complete by:  As directed    See Discharge Instructions If you are not getting better after two weeks or are noticing you are getting worse, contact our office [phone] for further advice.  We may need to adjust your medications, re-evaluate you in the office, send you to the emergency room, or see what other things we can do to help. The clinic staff is available to answer your questions during regular business hours (8:30am-5pm).  Please don't hesitate to call and ask to speak to one of our nurses for clinical concerns.    A surgeon from [facility] is always on call at the hospitals 24 hours/day If you have a medical emergency, go to the nearest emergency  room or call 911.   Driving Restrictions    Complete by:  As directed    You may drive when you are no longer taking narcotic prescription pain medication, you can comfortably wear a seatbelt, and you can safely make sudden turns/stops to protect yourself without hesitating due to pain.   Home infusion instructions [facility] May follow [place] Dosing Protocol; May administer Cathflo as needed to maintain patency of vascular access device.; Flushing of vascular access device: per [facility] Protocol: 0.9% NaCl pre/post medica...    Complete by:  As directed    Instructions:  May follow [place] Dosing Protocol   Instructions:  May administer Cathflo as needed to maintain patency of vascular access device.   Instructions:  Flushing of vascular access device: per [facility] Protocol: 0.9% NaCl pre/post medication administration and prn patency; Heparin 100 u/ml, 57m for implanted ports and Heparin 10u/ml, 564mfor all other central venous catheters.   Instructions:  May follow [facility] Anaphylaxis Protocol for First Dose Administration in the home: 0.9% NaCl at 25-50 ml/hr to maintain IV access for protocol meds. Epinephrine 0.3 ml IV/IM PRN and  Benadryl 25-50 IV/IM PRN s/s of anaphylaxis.   Instructions:  [facility] Coordinator (RN) to assist per patient IV care needs in the home PRN.   Increase activity slowly    Complete by:  As directed    Start light daily activities --- self-care, walking, climbing stairs- beginning the day after surgery.  Gradually increase activities as tolerated.  Control your pain to be active.  Stop when you are tired.  Ideally, walk several times a day, eventually an hour a day.   Most people are back to most day-to-day activities in a few weeks.  It takes 4-8 weeks to get back to unrestricted, intense activity. If you can walk 30 minutes without difficulty, it is safe to try more intense activity such as jogging, treadmill, bicycling, low-impact aerobics, swimming, etc. Save the most intensive and strenuous activity for last (Usually 4-8 weeks after surgery) such as sit-ups, heavy lifting, contact sports, etc.  Refrain from any intense heavy lifting or straining until you are off narcotics for pain control.  You will have off days, but things should improve week-by-week. DO NOT PUSH THROUGH PAIN.  Let pain be your guide: If it hurts to do something, don't do it.  Pain is your body warning you to avoid that activity for another week until the pain goes down.   Lifting restrictions    Complete by:  As directed    If you can walk 30 minutes without difficulty, it is safe to try more intense activity such as jogging, treadmill, bicycling, low-impact aerobics, swimming, etc. Save the most intensive and strenuous activity for last (Usually 4-8 weeks after surgery) such as sit-ups, heavy lifting, contact sports, etc.  Refrain from any intense heavy lifting or straining until you are off narcotics for pain control.  You will have off days, but things should improve week-by-week. DO NOT PUSH THROUGH PAIN.  Let pain be your guide: If it hurts to do something, don't do it.  Pain is your body warning you to  avoid that activity for another week until the pain goes down.   May walk up steps    Complete by:  As directed    No wound care    Complete by:  As directed    It is good for closed incision and even open wounds to be  washed every day.  Shower every day.  Short baths are fine.  Wash the incisions and wounds clean with soap & water.    If you have a closed incision(s), wash the incision with soap & water every day.  You may leave closed incisions open to air if it is dry.   You may cover the incision with clean gauze & replace it after your daily shower for comfort. If you have skin tapes (Steristrips) or skin glue (Dermabond) on your incision, leave them in place.  They will fall off on their own like a scab.  You may trim any edges that curl up with clean scissors.  If you have staples, set up an appointment for them to be removed in the office in 10 days after surgery.  If you have a drain, wash around the skin exit site with soap & water and place a new dressing of gauze or band aid around the skin every day.  Keep the drain site clean & dry.   Sexual Activity Restrictions    Complete by:  As directed    You may have sexual intercourse when it is comfortable. If it hurts to do something, stop.      Allergies as of 04/25/2017      Reactions   Penicillins Swelling   Facial swelling Has patient had a PCN reaction causing immediate rash, facial/tongue/throat swelling, SOB or lightheadedness with hypotension: Yes Has patient had a PCN reaction causing severe rash involving mucus membranes or skin necrosis: Yes Has patient had a PCN reaction that required hospitalization No Has patient had a PCN reaction occurring within the last 10 years: No If all of the above answers are "NO", then may proceed with Cephalosporin use.   Ultram [tramadol] Hives   Adhesive [tape]    blisters   Cefaclor Rash   Ceclor   Erythromycin    Gastritis, abd cramps   Trimethoprim Rash   Ciprofloxacin Other (See  Comments)   Unknown On Dr notes    Fluconazole Rash   Oxycodone    " I just feel weird"   Pectin Rash   Pectin ring for stoma   Sulfa Antibiotics Rash      Medication List    TAKE these medications   anastrozole 1 MG tablet Commonly known as:  ARIMIDEX TAKE 1 TABLET DAILY   aspirin EC 81 MG tablet Take 81 mg by mouth daily.   aztreonam 2 GM/50ML Commonly known as:  AZACTAM Inject 50 mLs (2 g total) into the vein every 8 (eight) hours. Labs - Once weekly:  CBC/D and BMP, Labs - Every other week:  ESR and CRP  Dispense 75 doses   Biotin 5 MG Tabs Take 5 mg by mouth every morning.   Calcium-Magnesium-Vitamin D 400-166.7-133.3 MG-MG-UNIT Tabs Take 1 tablet by mouth daily.   diphenhydrAMINE 25 MG tablet Commonly known as:  BENADRYL Take 25 mg by mouth at bedtime as needed for itching or sleep. Reported on 09/15/2015   filgrastim 480 MCG/1.6ML injection Commonly known as:  NEUPOGEN Inject 1.6 ml under the skin every 5 days for life What changed:  how much to take  how to take this  when to take this  additional instructions   HYDROmorphone 2 MG tablet Commonly known as:  DILAUDID Take 1-2 tablets (2-4 mg total) by mouth every 4 (four) hours as needed for moderate pain or severe pain.   levothyroxine 150 MCG tablet Commonly known as:  SYNTHROID, LEVOTHROID Take  150 mcg by mouth daily.   loperamide 2 MG capsule Commonly known as:  IMODIUM Take 1-2 capsules (2-4 mg total) by mouth every 8 (eight) hours as needed for diarrhea or loose stools (Use if >2 BM every 8 hours).   loratadine 10 MG tablet Commonly known as:  CLARITIN Take 10 mg by mouth daily.   losartan 100 MG tablet Commonly known as:  COZAAR Take 1 tablet (100 mg total) by mouth daily.   metFORMIN 1000 MG tablet Commonly known as:  GLUCOPHAGE Take 1,000 mg by mouth 2 (two) times daily with a meal.   methocarbamol 500 MG tablet Commonly known as:  ROBAXIN Take 500 mg by mouth every 4 (four)  hours as needed for muscle spasms.   omega-3 acid ethyl esters 1 g capsule Commonly known as:  LOVAZA Take 1 g by mouth 2 (two) times daily.   ondansetron 4 MG disintegrating tablet Commonly known as:  ZOFRAN-ODT Take 4 mg by mouth every 6 (six) hours as needed for nausea or vomiting.   PRENATAL VITAMIN PO Take 1 capsule by mouth daily. Takes prenatal because there are no dyes in it   rosuvastatin 10 MG tablet Commonly known as:  CRESTOR Take 10 mg by mouth every evening.   sitaGLIPtin 100 MG tablet Commonly known as:  JANUVIA Take 100 mg by mouth every morning.   SYSTANE OP Place 1 drop into both eyes at bedtime.   vancomycin IVPB Inject 500 mg into the vein every 12 (twelve) hours. Indication:  Intra-abdominal infection Last Day of Therapy:  10/19 Labs - Sunday/Monday:  CBC/D, BMP, and vancomycin trough. Labs - Thursday:  BMP and vancomycin trough Labs - Every other week:  ESR and CRP   Vitamin D3 10000 units capsule Take 10,000 Units by mouth once a week. Sunday evening's            Home Infusion Instuctions        Start     Ordered   04/25/17 0000  Home infusion instructions [facility] May follow [facility] Dosing Protocol; May administer Cathflo as needed to maintain patency of vascular access device.; Flushing of vascular access device: per [facility] Protocol: 0.9% NaCl pre/post medica...    Question Answer Comment  Instructions May follow [facility] Dosing Protocol   Instructions May administer Cathflo as needed to maintain patency of vascular access device.   Instructions Flushing of vascular access device: per [facility] Protocol: 0.9% NaCl pre/post medication administration and prn patency; Heparin 100 u/ml, 5ml for implanted ports and Heparin 10u/ml, 5ml for all other central venous catheters.   Instructions May follow [facility] Anaphylaxis Protocol for First Dose Administration in the home: 0.9% NaCl at 25-50 ml/hr to maintain IV access for protocol meds. Epinephrine  0.3 ml IV/IM PRN and Benadryl 25-50 IV/IM PRN s/s of anaphylaxis.   Instructions [facility] Infusion Coordinator (RN) to assist per patient IV care needs in the home PRN.      09 /24/18 0859       Discharge Care Instructions        Start     Ordered   04/25/17 0000  Discharge instructions    Comments:  See Discharge Instructions If you are not getting better after two weeks or are noticing you are getting worse, contact our office [phone] for further advice.  We may need to adjust your medications, re-evaluate you in the office, send you to the emergency room, or see what other things we  can do to help. The clinic staff is available to answer your questions during regular business hours (8:30am-5pm).  Please don't hesitate to call and ask to speak to one of our nurses for clinical concerns.    A surgeon from [facility] is always on call at the hospitals 24 hours/day If you have a medical emergency, go to the nearest emergency room or call 911.   04/25/17 0829   04/25/17 0000  Diet - low sodium heart healthy    Comments:  Follow a light diet the first few days at home.   Start with a bland diet such as soups, liquids, starchy foods, low fat foods, etc.   If you feel full, bloated, or constipated, stay on a full liquid or pureed/blenderized diet for a few days until you feel better and no longer constipated. Be sure to drink plenty of fluids every day to avoid getting dehydrated (feeling dizzy, not urinating, etc.). Gradually add a fiber supplement to your diet   04/25/17 0829   04/25/17 0000  Increase activity slowly    Comments:  Start light daily activities --- self-care, walking, climbing stairs- beginning the day after surgery.  Gradually increase activities as tolerated.  Control your pain to be active.  Stop when you are tired.  Ideally, walk several times a day, eventually an hour a day.   Most people are back to most day-to-day activities in a few weeks.   It takes 4-8 weeks to get back to unrestricted, intense activity. If you can walk 30 minutes without difficulty, it is safe to try more intense activity such as jogging, treadmill, bicycling, low-impact aerobics, swimming, etc. Save the most intensive and strenuous activity for last (Usually 4-8 weeks after surgery) such as sit-ups, heavy lifting, contact sports, etc.  Refrain from any intense heavy lifting or straining until you are off narcotics for pain control.  You will have off days, but things should improve week-by-week. DO NOT PUSH THROUGH PAIN.  Let pain be your guide: If it hurts to do something, don't do it.  Pain is your body warning you to avoid that activity for another week until the pain goes down.   04/25/17 0829   04/25/17 0000  May walk up steps     04/25/17 0829   04/25/17 0000  Driving Restrictions    Comments:  You may drive when you are no longer taking narcotic prescription pain medication, you can comfortably wear a seatbelt, and you can safely make sudden turns/stops to protect yourself without hesitating due to pain.   04/25/17 0829   04/25/17 0000  Lifting restrictions    Comments:  If you can walk 30 minutes without difficulty, it is safe to try more intense activity such as jogging, treadmill, bicycling, low-impact aerobics, swimming, etc. Save the most intensive and strenuous activity for last (Usually 4-8 weeks after surgery) such as sit-ups, heavy lifting, contact sports, etc.  Refrain from any intense heavy lifting or straining until you are off narcotics for pain control.  You will have off days, but things should improve week-by-week. DO NOT PUSH THROUGH PAIN.  Let pain be your guide: If it hurts to do something, don't do it.  Pain is your body warning you to avoid that activity for another week until the pain goes down.   04/25/17 0829   04/25/17 0000  Sexual Activity Restrictions    Comments:  You may have sexual intercourse when it is comfortable. If it hurts  to  do something, stop.   04/25/17 0829   04/25/17 0000  No wound care    Comments:  It is good for closed incision and even open wounds to be washed every day.  Shower every day.  Short baths are fine.  Wash the incisions and wounds clean with soap & water.    If you have a closed incision(s), wash the incision with soap & water every day.  You may leave closed incisions open to air if it is dry.   You may cover the incision with clean gauze & replace it after your daily shower for comfort. If you have skin tapes (Steristrips) or skin glue (Dermabond) on your incision, leave them in place.  They will fall off on their own like a scab.  You may trim any edges that curl up with clean scissors.  If you have staples, set up an appointment for them to be removed in the office in 10 days after surgery.  If you have a drain, wash around the skin exit site with soap & water and place a new dressing of gauze or band aid around the skin every day.  Keep the drain site clean & dry.   04/25/17 0829   04/25/17 0000  Call MD for:    Comments:  FEVER > 101.5 F  (temperatures < 101.5 F are not significant)   04/25/17 0829   04/25/17 0000  Call MD for:  persistant nausea and vomiting     04/25/17 0829   04/25/17 0000  Call MD for:  severe uncontrolled pain     04/25/17 0829   04/25/17 0000  Call MD for:  redness, tenderness, or signs of infection (pain, swelling, redness, odor or green/yellow discharge around incision site)     04/25/17 0829   04/25/17 0000  Call MD for:  persistant dizziness or light-headedness     04/25/17 0829   04/25/17 0000  Call MD for:  extreme fatigue     04/25/17 0829   04/25/17 0000  Home infusion instructions [facility] May follow [place] Dosing Protocol; May administer Cathflo as needed to maintain patency of vascular access device.; Flushing of vascular access device: per [facility] Protocol: 0.9% NaCl pre/post medica...    Question Answer Comment  Instructions May follow  [place] Dosing Protocol   Instructions May administer Cathflo as needed to maintain patency of vascular access device.   Instructions Flushing of vascular access device: per [facility] Protocol: 0.9% NaCl pre/post medication administration and prn patency; Heparin 100 u/ml, 44m for implanted ports and Heparin 10u/ml, 570mfor all other central venous catheters.   Instructions May follow [facility] Anaphylaxis Protocol for First Dose Administration in the home: 0.9% NaCl at 25-50 ml/hr to maintain IV access for protocol meds. Epinephrine 0.3 ml IV/IM PRN and Benadryl 25-50 IV/IM PRN s/s of anaphylaxis.   Instructions [facility] Infusion Coordinator (RN) to assist per patient IV care needs in the home PRN.      04/25/17 0859   04/25/17 0000  vancomycin IVPB  Every 12 hours     04/25/17 0859   04/25/17 0000  aztreonam (AZACTAM) 2 GM/50ML  Every 8 hours     04/25/17 0859   04/23/17 0000  IR Radiologist Eval & Mgmt    Question Answer Comment  Reason for Exam (SYMPTOM  OR DIAGNOSIS REQUIRED) will need CT and drain injection 5-7 days---post pelvic abscess drain 9/15   [facility]  04/23/17 0908      Significant Diagnostic Studies:  Results for orders placed or performed during the hospital encounter of 04/15/17 (from the past 72 hour(s))  Glucose, capillary     Status: Abnormal   Collection Time: 04/22/17 12:09 PM  Result Value Ref Range   Glucose-Capillary 138 (H) 65 - 99 mg/dL  Glucose, capillary     Status: Abnormal   Collection Time: 04/22/17  5:09 PM  Result Value Ref Range   Glucose-Capillary 160 (H) 65 - 99 mg/dL  CBC     Status: Abnormal   Collection Time: 04/23/17  4:31 AM  Result Value Ref Range   WBC 15.0 (H) 4.0 - 10.5 K/uL   RBC 2.90 (L) 3.87 - 5.11 MIL/uL   Hemoglobin 8.1 (L) 12.0 - 15.0 g/dL   HCT 25.2 (L) 36.0 - 46.0 %   MCV 86.9 78.0 - 100.0 fL   MCH 27.9 26.0 - 34.0 pg   MCHC 32.1 30.0 - 36.0 g/dL   RDW 18.0 (H) 11.5 -  15.5 %   Platelets 362 150 - 400 K/uL  Basic metabolic panel     Status: Abnormal   Collection Time: 04/23/17  4:31 AM  Result Value Ref Range   Sodium 139 135 - 145 mmol/L   Potassium 5.3 (H) 3.5 - 5.1 mmol/L   Chloride 104 101 - 111 mmol/L   CO2 25 22 - 32 mmol/L   Glucose, Bld 113 (H) 65 - 99 mg/dL   BUN 22 (H) 6 - 20 mg/dL   Creatinine, Ser 0.91 0.44 - 1.00 mg/dL   Calcium 8.4 (L) 8.9 - 10.3 mg/dL   GFR calc non Af Amer >60 >60 mL/min   GFR calc Af Amer >60 >60 mL/min    Comment: (NOTE) The eGFR has been calculated using the CKD EPI equation. This calculation has not been validated in all clinical situations. eGFR's persistently <60 mL/min signify possible Chronic Kidney Disease.    Anion gap 10 5 - 15    No results found.  Discharge Exam: Blood pressure (!) 122/52, pulse 83, temperature 98.6 F (37 C), temperature source Oral, resp. rate 16, height 5' 2.5" (1.588 m), weight 96.3 kg (212 lb 3.2 oz), SpO2 97 %.  General: Pt awake/alert/oriented x4 in No acute distress Eyes: PERRL, normal EOM.  Sclera clear.  No icterus.  Lowered right eyelid and mildly disconjugate gaze unchanged Neuro: CN II-XII intact w/o focal sensory/motor deficits. Lymph: No head/neck/groin lymphadenopathy Psych:  No delerium/psychosis/paranoia HENT: Normocephalic, Mucus membranes moist.  No thrush Neck: Supple, No tracheal deviation Chest: No chest wall pain w good excursion CV:  Pulses intact.  Regular rhythm MS: Normal AROM mjr joints.  No obvious deformity Abdomen: Soft.  Nondistended.  Nontender.  No evidence of peritonitis.  No incarcerated hernias.  Ileostomy flat but intact.  No leak or rash.  Incisions closed.  No parastomal hernia.  Suprapubic percutaneous drain with serosanguineous fluid.  Foley catheter with clear yellow fluid.  Ext:  SCDs BLE.  PICC line in right upper extremity clean  No mjr edema.  No cyanosis Skin: No petechiae / purpura  Past Medical History:  Diagnosis Date  .  Acute bacterial bronchitis 06/04/2015  . Anemia in neoplastic disease   . Benign essential HTN   . Breast cancer of upper-inner quadrant of left female breast ([place])   . 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 Cancer of corpus uteri, except isthmus [facility]) dx 10-15-2004 oncologist-- dr Denman [name] and dr Alvy Bimler     dx endometrial and ovarian cancer s/p  chemotheapy and surgery:  recurrent 11-19-2014 w/ radiation 01-29-2015 to 03-10-2015  . Cellulitis of left abdominal wall near colostomy 12/03/2014  . Chronic idiopathic neutropenia (HCC)    presumed related to chemotherapy March 2006--- followed by dr Alvy Bimler   . Complication of anesthesia    PONV  . Diabetic retinopathy, background ([place])   . DM type 2 (diabetes mellitus, type 2) ([place])    monitored by dr Legrand Como altheimer  . Dysuria   . GERD (gastroesophageal reflux disease)    takes omeprazole  . Hiatal hernia   . History of bronchitis   . History of gastric polyp    2014  duodenum  . History of radiation therapy    01-29-2015 to 03-10-2015  pelvis 50.4Gy  . Hypothyroidism    monitored by dr Legrand Como altheimer  . Malignant neoplasm of corpus uteri, except isthmus ([place]) 05/10/2012  . Malignant neoplasm of ovary ([place]) 05/10/2012  . Mixed dyslipidemia   . Multiple thyroid nodules    Managed by Dr. Harlow Asa  . PONV (postoperative nausea and vomiting)    "scopolamine patch works for me"  . Radiation-induced dermatitis    contact dermatitis , radiation completed, rash only on ankles now.  . Reactive thrombocytosis 11/29/2014  . Right flank pain   . S/P colostomy ([place])   . Seasonal allergies   . Shoulder stiffness    bilateral   . Ureteral stricture, right UROLOGIT-  DR [facility]   CHRONIC--  TREATMENT URETERAL STENT  . Vitamin D deficiency   . Wears glasses      Past Surgical History:  Procedure Laterality Date  . APPENDECTOMY    . BREAST RECONSTRUCTION WITH PLACEMENT OF TISSUE EXPANDER AND FLEX HD (ACELLULAR HYDRATED DERMIS) Bilateral 12/11/2015   Procedure: BILATERAL BREAST RECONSTRUCTION WITH PLACEMENT OF TISSUE EXPANDERS;  Surgeon: Irene Limbo, MD;  Location: [place];  Service: Plastics;  Laterality: Bilateral;  . COLONOSCOPY WITH PROPOFOL N/A 08/21/2013   Procedure: COLONOSCOPY WITH PROPOFOL;  Surgeon: Cleotis Nipper, MD;  [facility];  Service: Endoscopy;  Laterality: N/A;  . COLOSTOMY TAKEDOWN N/A 12/04/2014   Procedure: LAPROSCOPIC LYSIS OF ADHESIONS, SPLENIC MOBILIZATION, RELOCATION OF COLOSTOMY, DEBRIDEMENT INITIAL COLOSTOMY SITE;  Surgeon: Michael [name], MD;  [facility];  Service: General;  Laterality: N/A;  . CYSTOSCOPY W/ RETROGRADES Right 11/21/2015   Procedure: CYSTOSCOPY WITH RETROGRADE PYELOGRAM;  Surgeon: Alexis Frock, MD;  [facility];  Service: Urology;  Laterality: Right;  . CYSTOSCOPY W/ URETERAL STENT PLACEMENT Right 11/21/2015   Procedure: CYSTOSCOPY WITH STENT REPLACEMENT;  Surgeon: Alexis Frock, MD;  [facility];  Service: Urology;  Laterality: Right;  . CYSTOSCOPY W/ URETERAL STENT PLACEMENT Right 03/10/2016   Procedure: CYSTOSCOPY WITH STENT REPLACEMENT;  Surgeon: Alexis Frock, MD;  Location: [facility];  Service: Urology;  Laterality: Right;  . CYSTOSCOPY W/ URETERAL STENT PLACEMENT Right 06/30/2016   Procedure: CYSTOSCOPY WITH RETROGRADE PYELOGRAM/URETERAL STENT EXCHANGE;  Surgeon: Alexis Frock, MD;  Location[facility];  Service: Urology;  Laterality: Right;  . CYSTOSCOPY WITH RETROGRADE PYELOGRAM, URETEROSCOPY AND STENT PLACEMENT Right 03/20/2015   Procedure: CYSTOSCOPY WITH RETROGRADE PYELOGRAM, URETEROSCOPY WITH BALLOON DILATION AND STENT PLACEMENT ON RIGHT;  Surgeon: Alexis Frock, MD;  Location: [facility];  Service: Urology;   Laterality: Right;  . CYSTOSCOPY WITH RETROGRADE PYELOGRAM, URETEROSCOPY AND STENT PLACEMENT Right 05/02/2015   Procedure: CYSTOSCOPY WITH RIGHT RETROGRADE PYELOGRAM,  DIAGNOSTIC URETEROSCOPY AND STENT PULL ;  Surgeon: Alexis Frock, MD;  Location: [facility];  Service: Urology;  Laterality: Right;  . CYSTOSCOPY WITH RETROGRADE PYELOGRAM, URETEROSCOPY AND STENT PLACEMENT Right 09/05/2015   Procedure: CYSTOSCOPY WITH RETROGRADE PYELOGRAM,  AND STENT PLACEMENT;  Surgeon: Alexis Frock, MD;  [facility];  Service: Urology;  Laterality: Right;  . CYSTOSCOPY WITH RETROGRADE PYELOGRAM, URETEROSCOPY AND STENT PLACEMENT Right 04/01/2017   Procedure: CYSTOSCOPY WITH RETROGRADE PYELOGRAM, URETEROSCOPY AND STENT PLACEMENT;  Surgeon: Alexis Frock, MD;  [facility];  Service: Urology;  Laterality: Right;  . CYSTOSCOPY WITH STENT PLACEMENT Right 10/27/2016   Procedure: CYSTOSCOPY WITH STENT CHANGE and right retrograde pyelogram;  Surgeon: Alexis Frock, MD;  Location: [facility];  Service: Urology;  Laterality: Right;  . EUS N/A 10/02/2014   Procedure: LOWER ENDOSCOPIC ULTRASOUND (EUS);  Surgeon: Arta Silence, MD;  Location: [facility];  Service: Endoscopy;  Laterality: N/A;  . EXCISION SOFT TISSUE MASS RIGHT FOREMAN  12-08-2006  . EYE SURGERY  as child   pytosis of eyelids repair  . INCISION AND DRAINAGE OF WOUND Bilateral 12/26/2015   Procedure: DEBRIDEMENT OF BILATERAL MASTECTOMY FLAPS;  Surgeon: Irene Limbo, MD;  Location: [place];  Service: Plastics;  Laterality: Bilateral;  . IR FLUORO GUIDE CV LINE RIGHT  04/06/2017  . IR US GUIDE VASC ACCESS RIGHT  04/06/2017  . LAPAROSCOPIC CHOLECYSTECTOMY  1990  . LIPOSUCTION WITH LIPOFILLING Bilateral 04/16/2016   Procedure: LIPOSUCTION WITH LIPOFILLING TO BILATERAL CHEST;  Surgeon: Irene Limbo, MD;  Location: [place];  Service: Plastics;  Laterality: Bilateral;  . MASTECTOMY Right  12/11/2015   PROPHYLACTIC   . MASTECTOMY COMPLETE / SIMPLE W/ SENTINEL NODE BIOPSY Left 12/11/2015   AXILLARY SENTINEL LYMPH NODE BIOPSY   . MASTECTOMY W/ SENTINEL NODE BIOPSY Bilateral 12/11/2015   Procedure: RIGHT PROPHYLACTIC MASTECTOMY, LEFT TOTAL MASTECTOMY WITH LEFT AXILLARY SENTINEL LYMPH NODE BIOPSY;  Surgeon: Stark Klein, MD;  Location: [place];  Service: General;  Laterality: Bilateral;  . OSTOMY N/A 11/19/2014   Procedure: OSTOMY;  Surgeon: Michael [name], MD;  [facility];  Service: General;  Laterality: N/A;  . PROCTOSCOPY N/A 04/01/2017   Procedure: RIDGE PROCTOSCOPY;  Surgeon: Michael [name], MD;  [facility];  Service: General;  Laterality: N/A;  . REMOVAL OF BILATERAL TISSUE EXPANDERS WITH PLACEMENT OF BILATERAL BREAST IMPLANTS Bilateral 04/16/2016   Procedure: REMOVAL OF BILATERAL TISSUE EXPANDERS WITH PLACEMENT OF BILATERAL BREAST IMPLANTS;  Surgeon: Irene Limbo, MD;  Location: [place];  Service: Plastics;  Laterality: Bilateral;  . ROBOTIC ASSISTED LAP VAGINAL HYSTERECTOMY N/A 11/19/2014   Procedure: ROBOTIC LYSIS OF ADHESIONS, CONVERTED TO LAPAROTOMY RADICAL UPPER VAGINECTOMY,LOW ANTERIOR BOWEL RESECTION, COLOSTOMY, BILATERAL URETERAL STENT PLACEMENT AND CYSTONOMY CLOSURE;  Surgeon: Everitt Amber, MD;  [facility];  Service: Gynecology;  Laterality: N/A;  . TISSUE EXPANDER FILLING Bilateral 12/26/2015   Procedure: EXPANSION OF BILATERAL CHEST TISSUE EXPANDERS (60 mL- Right; 75 mL- Left);  Surgeon: Irene Limbo, MD;  Location: [place];  Service: Plastics;  Laterality: Bilateral;  . TONSILLECTOMY    . TOTAL ABDOMINAL HYSTERECTOMY  March 2006   [facility]   and Bilateral Salpingoophorectomy/  staging for Ovarian cancer/  an  . XI ROBOTIC ASSISTED LOWER ANTERIOR RESECTION N/A 04/01/2017   Procedure: XI ROBOTIC  VS LAPAROSCOPIC COLOSTOMY TAKEDOWN WITH LYSIS OF ADHESIONS.;  Surgeon: Michael [name], MD;  [facility];  Service: General;   Laterality: N/A;  ERAS PATHWAY    Social History   Social History  . Marital status: Married    Spouse name: N/A  . Number of children: 1  . Years of education: N/A   Occupational History  . retired Therapist, sports from [place] History Main Topics  . Smoking status: Never Smoker  . Smokeless tobacco: Never Used  . Alcohol use Yes     Comment: rare social  . Drug use: Unknown  . Sexual activity: Not Currently   Other Topics Concern  . Not on file   Social History Narrative   Exercise-- has not gotten back into it since cancer came back    Family History  Problem Relation Age of Onset  . Cancer Mother 46       stomach ca  . Hypertension Mother   . Cancer Father 35       prostate ca  . Diabetes Father   . Heart disease Father        CABG  . Breast cancer Maternal Aunt        dx in her 40s  . Lymphoma Paternal Aunt   . Brain cancer Paternal Grandfather   . Ovarian cancer Other   . Diabetes Sister   . Hypertension Brother y-10  . Heart disease Brother        CABG  . Diabetes Brother     Current Facility-Administered Medications  Medication Dose Route Frequency Provider Last Rate Last Dose  . alum & mag hydroxide-simeth (MAALOX/MYLANTA) 200-200-20 MG/5ML suspension 30 mL  30 mL Oral Q6H PRN Michael [name], MD      . anastrozole (ARIMIDEX) tablet 1 mg  1 mg Oral Daily Michael [name], MD   1 mg at 04/24/17 1024  . aspirin EC tablet 81 mg  81 mg Oral Daily Michael [name], MD   81 mg at 04/24/17 1026  . aztreonam (AZACTAM) 2 GM IVPB  2 g Intravenous Tor Netters, MD   Stopped at 04/25/17 0231  . bismuth subsalicylate (PEPTO BISMOL) 262 MG/15ML suspension 30 mL  30 mL Oral Q8H PRN Michael [name], MD      . chlorproMAZINE (THORAZINE) 25 mg in sodium chloride 0.9 % 25 mL IVPB  25 mg Intravenous Q6H PRN Michael [name], MD      . diphenhydrAMINE (BENADRYL) injection 12.5-25 mg  12.5-25 mg Intravenous Q6H PRN Michael [name], MD   25 mg at 04/21/17 0110  . enoxaparin (LOVENOX)  injection 40 mg  40 mg Subcutaneous Q24H Michael [name], MD   40 mg at 04/24/17 1025  . feeding supplement (ENSURE SURGERY) liquid 237 mL  237 mL Oral BID BM Michael [name], MD      . guaiFENesin-dextromethorphan (ROBITUSSIN DM) 100-10 MG/5ML syrup 10 mL  10 mL Oral Q4H PRN Michael [name], MD      . hydrALAZINE (APRESOLINE) injection 5-20 mg  5-20 mg Intravenous Q6H PRN Michael [name], MD      . hydrocortisone (ANUSOL-HC) 2.5 % rectal cream 1 application  1 application Topical QID PRN Michael [name], MD      . HYDROmorphone (DILAUDID) injection 1 mg  1 mg Intravenous Q2H PRN Michael [name], MD      . HYDROmorphone (DILAUDID) tablet 2-4 mg  2-4 mg Oral Q4H PRN Michael [name], MD   2 mg at 04/25/17 7867  . lactated  ringers 1,000 mL with potassium chloride 20 mEq infusion   Intravenous Continuous Michael [name], MD 10 mL/hr at 04/23/17 0201    . levothyroxine (SYNTHROID, LEVOTHROID) tablet 150 mcg  150 mcg Oral QAC breakfast Michael [name], MD   150 mcg at 04/25/17 0825  . linagliptin (TRADJENTA) tablet 5 mg  5 mg Oral Daily Michael [name], MD   5 mg at 04/24/17 1026  . lip balm (CARMEX) ointment 1 application  1 application Topical BID Michael [name], MD   1 application at 40/98/11 1031  . loperamide (IMODIUM) capsule 2 mg  2 mg Oral Ardeen Fillers, MD   2 mg at 04/24/17 2133  . loperamide (IMODIUM) capsule 2-4 mg  2-4 mg Oral Q8H PRN Michael [name], MD      . loratadine (CLARITIN) tablet 10 mg  10 mg Oral Daily Michael [name], MD   10 mg at 04/24/17 1026  . LORazepam (ATIVAN) injection 0.5-1 mg  0.5-1 mg Intravenous Q8H PRN Michael [name], MD      . magic mouthwash  15 mL Oral QID PRN Michael [name], MD      . menthol-cetylpyridinium (CEPACOL) lozenge 3 mg  1 lozenge Oral PRN Michael [name], MD      . methocarbamol (ROBAXIN) 1,000 mg in dextrose 5 % 50 mL IVPB  1,000 mg Intravenous Q6H PRN Michael [name], MD   Stopped at 04/20/17 1211  . methocarbamol (ROBAXIN) tablet 500 mg  500 mg Oral Q4H PRN Michael [name], MD    500 mg at 04/22/17 1043  . metoCLOPramide (REGLAN) injection 5-10 mg  5-10 mg Intravenous Q6H PRN Michael [name], MD   10 mg at 04/24/17 1559  . metoprolol tartrate (LOPRESSOR) injection 5 mg  5 mg Intravenous Q6H PRN Michael [name], MD      . ondansetron (ZOFRAN-ODT) disintegrating tablet 4 mg  4 mg Oral Q6H PRN Excell Seltzer, MD   4 mg at 04/25/17 9147   Or  . ondansetron (ZOFRAN) injection 4 mg  4 mg Intravenous Q6H PRN Excell Seltzer, MD   4 mg at 04/21/17 0926  . phenol (CHLORASEPTIC) mouth spray 1-2 spray  1-2 spray Mouth/Throat PRN Michael [name], MD      . prenatal multivitamin tablet 1 tablet  1 tablet Oral Q1200 Michael [name], MD   1 tablet at 04/24/17 1556  . prochlorperazine (COMPAZINE) injection 5-10 mg  5-10 mg Intravenous Q4H PRN Michael [name], MD      . protein supplement (PREMIER PROTEIN) liquid  11 oz Oral BID BM Michael [name], MD   11 oz at 04/24/17 1031  . rosuvastatin (CRESTOR) tablet 10 mg  10 mg Oral QPM Michael [name], MD   10 mg at 04/24/17 1844  . sodium chloride (OCEAN) 0.65 % nasal spray 1 spray  1 spray Each Nare PRN Stark Klein, MD      . sodium chloride flush (NS) 0.9 % injection 10-40 mL  10-40 mL Intracatheter PRN Excell Seltzer, MD   10 mL at 04/22/17 1132  . sodium chloride flush (NS) 0.9 % injection 5 mL  5 mL Intravenous Lovena Neighbours, MD   5 mL at 04/24/17 0559  . Tbo-Filgrastim (GRANIX) injection 480 mcg  480 mcg Subcutaneous 6 days Excell Seltzer, MD   480 mcg at 04/24/17 1843  . vancomycin (VANCOCIN) 500 mg in sodium chloride 0.9 % 100 mL IVPB  500 mg Intravenous Q12H Emiliano Dyer, [facility]   Stopped at 04/24/17 2234  . Vitamin D3 10,000 Units  10,000 Units Oral Weekly Michael [name], MD         Allergies  Allergen Reactions  . Penicillins Swelling    Facial swelling Has patient had a PCN reaction causing immediate rash, facial/tongue/throat swelling, SOB or lightheadedness with hypotension: Yes Has patient had a PCN reaction causing  severe rash involving mucus membranes or skin necrosis: Yes Has patient had a PCN reaction that required hospitalization No Has patient had a PCN reaction occurring within the last 10 years: No If all of the above answers are "NO", then may proceed with Cephalosporin use.   [name] Kitchen Ultram [Tramadol] Hives  . Adhesive [Tape]     blisters  . Cefaclor Rash    Ceclor  . Erythromycin     Gastritis, abd cramps  . Trimethoprim Rash  . Ciprofloxacin Other (See Comments)    Unknown On Dr notes   . Fluconazole Rash  . Oxycodone     " I just feel weird"  . Pectin Rash    Pectin ring for stoma  . Sulfa Antibiotics Rash    Signed: Morton Peters, M.D., F.A.C.S. Gastrointestinal and Minimally Invasive Surgery [facility] [address] [place], [place] [id number] [phone] Main / Paging   04/25/2017, 8:30 AM

## 2017-04-25 NOTE — Discharge Instructions (Signed)
DRAIN CARE:   You have a closed bulb drain to help you heal.    A bulb drain is a small, plastic reservoir which creates a gentle suction. It is used to remove excess fluid from a surgical wound. The color and amount of fluid will vary. Immediately after surgery, the fluid is bright red. It may gradually change to a yellow color. When the amount decreases to about 1 or 2 tablespoons (15 to 30 cc) per 24 hours, your caregiver will usually remove it.  JP Care  The Jackson-Pratt drainage system has flexible tubing attached to a soft, plastic bulb with a stopper. The drainage end of the tubing, which is flat and white, goes into your body through a small opening near your incision (surgical cut). A stitch holds the drainage end in place. The rest of the tube is outside your body, attached to the bulb. When the bulb is compressed with the stopper in place, it creates a vacuum. This causes a constant gentle suction, which helps draw out fluid that collects under your incision. The bulb should be compressed at all times, except when you are emptying the drainage.  How long you will have your Jackson-Pratt depends on your surgery and the amount of fluid is draining. This is different for everyone. The Jackson-Pratt is usually removed when the drainage is 30 mL or less over 24 hours. To keep track of how much drainage youre having, you will record the amount in a drainage log. Its important to bring the log with you to your follow-up appointments.  Caring for Your Jackson-Pratt at Home In order to care for your Jackson-Pratt at home, you or your caregiver will do the following:  Empty the drain once a day and record the color and amount of drainage  Care for the area where the tubing enters your skin by washing with soap and water.  Milk the tubing to help move clots into the bulb.  Do this before you empty and measure your drainage. Look in the mirror at the tubing. This will help you see where your  hands need to be. Pinch the tubing close to where it goes into your skin between your thumb and forefinger. With the thumb and forefinger of your other hand, pinch the tubing right below your other fingers. Keep your fingers pinched and slide them down the tubing, pushing any clots down toward the bulb. You may want to use alcohol swabs to help you slide your fingers down the tubing. Repeat steps 3 and 4 as necessary to push clots from the tubing into the bulb. If you are not able to move a clot into the bulb, call your doctors office. The fluid may leak around the insertion site if a clot is blocking the drainage flow. If there is fluid in the bulb and no leakage at the insertion site, the drain is working.  How to Empty Your Jackson-Pratt and Record the Drainage You will need to empty your Jackson-Pratt every day  Gather the following supplies:  Measuring container your nurse gave you Jackson-Pratt Drainage Record  Pen or pencil  Instructions Clean an area to work on. Clean your hands thoroughly. Unplug the stopper on top of your Jackson-Pratt. This will cause the bulb to expand. Do not touch the inside of the stopper or the inner area of the opening on the bulb. Turn your Jackson-Pratt upside down, gently squeeze the bulb, and pour the drainage into the measuring container. Turn your Jackson-Pratt right  side up. Squeeze the bulb until your fingers feel the palm of your hand. Keep squeezing the bulb while you replug the stopper. Make sure the bulb stays fully compressed to ensure constant, gentle suction.    Check the amount and color of drainage in the measuring container. The first couple days after surgery the fluid may be dark red. This is normal. As you heal the fluid may look pink or pale yellow. Record this amount and the color of drainage on your Jackson-Pratt Drainage Record. Flush the drainage down the toilet and rinse the measuring container with water.  Caring for the  Insertion Site  Once you have emptied the drainage, clean your hands again. Check the area around the insertion site. Look for tenderness, swelling, or pus. If you have any of these, or if you have a temperature of 101 F (38.3 C) or higher, you may have an infection. Call your doctors office.  Sometimes, the drain causes redness the size of a dime at your insertion site. This is normal. Your healthcare provider will tell you if you should place a bandage over the insertion site.  Wash drain site with soap & water (dilute hydrogen peroxide PRN) daily & replace clean dressing / tape    DAILY CARE  Keep the bulb compressed at all times, except while emptying it. The compression creates suction.   Keep sites where the tubes enter the skin dry and covered with a light bandage (dressing).   Tape the tubes to your skin, 1 to 2 inches below the insertion sites, to keep from pulling on your stitches. Tubes are stitched in place and will not slip out.   Pin the bulb to your shirt (not to your pants) with a safety pin.   For the first few days after surgery, there usually is more fluid in the bulb. Empty the bulb whenever it becomes half full because the bulb does not create enough suction if it is too full. Include this amount in your 24 hour totals.   When the amount of drainage decreases, empty the bulb at the same time every day. Write down the amounts and the 24 hour totals. Your caregiver will want to know them. This helps your caregiver know when the tubes can be removed.   (We anticipate removing the drain in 1-3 weeks, depending on when the output is <12m a day for 2+ days)  If there is drainage around the tube sites, change dressings and keep the area dry. If you see a clot in the tube, leave it alone. However, if the tube does not appear to be draining, let your caregiver know.  TO EMPTY THE BULB  Open the stopper to release suction.   Holding the stopper out of the way, pour  drainage into the measuring cup that was sent home with you.   Measure and write down the amount. If there are 2 bulbs, note the amount of drainage from bulb 1 or bulb 2 and keep the totals separate. Your caregiver will want to know which tube is draining more.   Compress the bulb by folding it in half.   Replace the stopper.   Check the tape that holds the tube to your skin, and pin the bulb to your shirt.  SEEK MEDICAL CARE IF:  The drainage develops a bad odor.   You have an oral temperature above 102 F (38.9 C).   The amount of drainage from your wound suddenly increases or decreases.  You accidentally pull out your drain.   You have any other questions or concerns.  MAKE SURE YOU:   Understand these instructions.   Will watch your condition.   Will get help right away if you are not doing well or get worse.     Call our office if you have any questions about your drain. [place] heard that people get along just fine with only one of their eyes, or one of their lungs, or one of their kidneys. But you also know that you have only one intestine and only one bladder, and that leaves you feeling awfully empty, both physically and emotionally: You think no other people go around without part of their intestine with the ends of their intestines sticking out through their abdominal walls.   YOU ARE NOT ALONE.  There are nearly three quarters of a million people in the [place] who have an ostomy; people who have had surgery to remove all or part of their colons or bladders.   There is even a national association, the [place] [employer] [place] with over 350 local affiliated support groups that are organized by volunteers who provide peer support and counseling. [place] has a toll free telephone num-ber, [phone] and an educational, interactive website, www.ostomy.org   An ostomy is an opening in the belly (abdominal wall) made by  surgery. Ostomates are people who have had this procedure. The opening (stoma) allows the kidney or bowel to discharge waste. An external pouch covers the stoma to collect waste. Pouches are are a simple bag and are odor free. Different companies have disposable or reusable pouches to fit one's lifestyle. An ostomy can either be temporary or permanent.   THERE ARE THREE MAIN TYPES OF OSTOMIES  Colostomy. A colostomy is a surgically created opening in the large intestine (colon).  Ileostomy. An ileostomy is a surgically created opening in the small intestine.  Urostomy. A urostomy is a surgically created opening to divert urine away from the bladder.  OSTOMY Care  The following guidelines will make care of your colostomy easier. Keep this information close by for quick reference.  Helpful DIET hints Eat a well-balanced diet including vegetables and fresh fruits. Eat on a regular schedule. Drink at least 6 to 8 glasses of fluids daily. Eat slowly in a relaxed atmosphere. Chew your food thoroughly. Avoid chewing gum, smoking, and drinking from a straw. This will help decrease the amount of air you swallow, which may help reduce gas. Eating yogurt or drinking buttermilk may help reduce gas.  To control gas at night, do not eat after 8 p.m. This will give your bowel time to quiet down before you go to bed.  If gas is a problem, you can purchase Beano. Sprinkle Beano on the first bite of food before eating to reduce gas. It has no flavor and should not change the taste of your food. You can buy Beano over the counter at your local drugstore.  Foods like fish, onions, garlic, broccoli, asparagus, and cabbage produce odor. Although your pouch is odor-proof, if you eat these foods you may notice a stronger odor when emptying your pouch. If this is a concern, you may want to limit these foods in your diet.  If you have an ileostomy, you will have chronic diarrhea & need to drink more liquids to avoid  getting dehydrated.  Consider antidiarrheal medicine like imodium (loperamide) or Lomotil to help slow down bowel movements /  diarrhea into your ileostomy bag.  GETTING TO GOOD BOWEL HEALTH WITH AN ILEOSTOMY . Irregular bowel habits such as constipation and diarrhea can lead to many problems over time.  The goal: 3-6 small BOWEL MOVEMENTS A DAY!  To have soft, regular bowel movements:   Drink plenty of fluids, consider 4-6 tall glasses of water a day.    Controlling diarrheA  o Switch to liquids and simpler foods for a few days to avoid stressing your intestines further. o Avoid dairy products (especially milk & ice cream) for a short time.  The intestines often can lose the ability to digest lactose when stressed. o Avoid foods that cause gassiness or bloating.  Typical foods include beans and other legumes, cabbage, broccoli, and dairy foods.  Every person has some sensitivity to other foods, so listen to our body and avoid those foods that trigger problems for you. o Adding fiber (Citrucel, Metamucil, psyllium, Miralax) gradually can help thicken stools by absorbing excess fluid and retrain the intestines to act more normally.  Slowly increase the dose over a few weeks.  Too much fiber too soon can backfire and cause cramping & bloating. o Probiotics (such as active yogurt, Align, etc) may help repopulate the intestines and colon with normal bacteria and calm down a sensitive digestive tract.  Most studies show it to be of mild help, though, and such products can be costly. o Medicines: - Bismuth subsalicylate (ex. Kayopectate, Pepto Bismol) every 30 minutes for up to 6 doses can help control diarrhea.  Avoid if pregnant. - Loperamide (Immodium) can slow down diarrhea.  Start with two tablets (52m total) first and then try one tablet every 6 hours.  Avoid if you are having fevers or severe pain.  If you are not better or start feeling worse, stop all medicines and call your doctor for  advice o Call your doctor if you are getting worse or not better.  Sometimes further testing (cultures, endoscopy, X-ray studies, bloodwork, etc) may be needed to help diagnose and treat the cause of the diarrhea.  TROUBLESHOOTING IRREGULAR BOWELS 1) Avoid extremes of bowel movements (no bad constipation/diarrhea) 2) Miralax 17gm mixed in 8oz. water or juice-daily. May use twice a day as needed  3) Gas-x,Phazyme, etc. as needed for gas & bloating.  4) Soft,bland diet. No spicy,greasy,fried foods.  5) Prilosec (omeprazole) over-the-counter as needed  6) May hold gluten/wheat products from diet to see if symptoms improve.  7)  May try probiotics (Align, Activa, etc) to help calm the bowels down 7) If symptoms become worse call back immediately.   Applying the pouching system To apply your pouch, follow these steps:  Place all your equipment close at hand before removing your pouch.  Wash your hands.  Stand or sit in front of a mirror. Use the position that works best for you. Remember that you must keep the skin around the stoma wrinkle-free for a good seal.  Gently remove the used pouch (1-piece system) or the pouch and old wafer (2-piece system). Empty the pouch into the toilet. Save the closure clip to use again.  Wash the stoma itself and the skin around the stoma. Your stoma may bleed a little when being washed. This is normal. Rinse and pat dry. You may use a wash cloth or soft paper towels (like Bounty), mild soap (like Dial, Safeguard, or [place], and water. Avoid soaps that contain perfumes or lotions.  For a new pouch (1-piece system) or a new wafer (2-piece  system), measure your stoma using the stoma guide in each box of supplies.  Trace the shape of your stoma onto the back of the new pouch or the back of the new wafer. Cut out the opening. Remove the paper backing and set it aside.  Optional: Apply a skin barrier powder to surrounding skin if it is irritated (bare or  weeping), and dust off the excess. Optional: Apply a skin-prep wipe (such as Skin Prep or All-Kare) to the skin around the stoma, and let it dry. Do not apply this solution if the skin is irritated (red, tender, or broken) or if you have shaved around the stoma. Optional: Apply a skin barrier paste (such as Stomahesive, Coloplast, or Premium) around the opening cut in the back of the pouch or wafer. Allow it to dry for 30 to 60 seconds.  Hold the pouch (1-piece system) or wafer (2-piece system) with the sticky side toward your body. Make sure the skin around the stoma is wrinkle-free. Center the opening on the stoma, then press firmly to your abdomen (Fig. 4). Look in the mirror to check if you are placing the pouch, or wafer, in the right position. For a 2-piece system, snap the pouch onto the wafer. Make sure it snaps into place securely.  Place your hand over the stoma and the pouch or wafer for about 30 seconds. The heat from your hand can help the pouch or wafer stick to your skin.  Add deodorant (such as Super Banish or Nullo) to your pouch. Other options include food extracts such as vanilla oil and peppermint extract. Add about 10 drops of the deodorant to the pouch. Then apply the closure clamp. Note: Do not use toxic  chemicals or commercial cleaning agents in your pouch. These substances may harm the stoma.  Optional: For extra seal, apply tape to all 4 sides around the pouch or wafer, as if you were framing a picture. You may use any brand of medical adhesive tape. Change your pouch every 5 to 7 days. Change it immediately if a leak occurs.  Wash your hands afterwards.  If you are wearing a 2-piece system, you may use 2 new pouches per week and alternate them. Rinse the pouch with mild soap and warm water and hang it to dry for the next day. Apply the fresh pouch. Alternate the 2 pouches like this for a week. After a week, change the wafer and begin with 2 new pouches. Place the old pouches  in a plastic bag, and put them in the trash.    Tips for colostomy care  Applying Your Pouch You may stand or sit to apply your pouch.  Keep the skin where you apply the pouch wrinkle-free. If the skin around the pouch is wrinkled, the seal may break when your skin stretches.  If hair grows close to your stoma, you may trim off the hair with scissors, an electric razor, or a safety razor.  Always have a mirror nearby so you can get a better view of your stoma.  When you apply a new pouch, write the date on the adhesive tape. This will remind you of when you last changed your pouch.  Changing Your Pouch The best time to change your pouch is in the morning, before eating or drinking anything. Your stoma can function at any time, but it will function more after eating or drinking.  Emptying Your Pouch Empty your pouch when it is one-third full (of urine, stool,  and/or gas). If you wait until your pouch is fuller than this, it will be more difficult to empty and more noticeable. When you empty your pouch, either put toilet paper in the toilet bowl first, or flush the toilet while you empty the pouch. This will reduce splashing. You can empty the pouch between your legs or to one side while sitting, or while standing or stooping. If you have a 2-piece system, you can snap off the pouch to empty it. Remember that your stoma may function during this time. If you wish to rinse your pouch after you empty it, a [place] baster can be helpful. When using a baster, squirt water up into the pouch through the opening at the bottom. With a 2-piece system, you can snap off the pouch to rinse it. After rinsing  your pouch, empty it into the toilet. When rinsing your pouch at home, put a few granules of Dreft soap in the rinse water. This helps lubricate and freshen your pouch. The inside of your pouch can be sprayed with non-stick cooking oil (Pam spray). This may help reduce stool sticking to the inside of  the pouch.  Bathing You may shower or bathe with your pouch on or off. Remember that your stoma may function during this time.  The materials you use to wash your stoma and the skin around it should be clean, but they do not need to be sterile.  Wearing Your Pouch During hot weather, or if you perspire a lot in general, wear a cover over your pouch. This may prevent a rash on your skin under the pouch. Pouch covers are sold at ostomy supply stores. Wear the pouch inside your underwear for better support. Watch your weight. Any gain or loss of 10 to 15 pounds or more can change the way your pouch fits.  Going Away From Home A collapsible cup (like those that come in travel kits) or a soft plastic squirt bottle with a pull-up top (like a travel bottle for shampoo) can be used for rinsing your pouch when you are away from home. Tilt the opening of the pouch at an upward angle when using a cup to rinse.  Carry wet wipes or extra tissues to use in public bathrooms.  Carry an extra pouching system with you at all times.  Never keep ostomy supplies in the glove compartment of your car. Extreme heat or cold can damage the skin barriers and adhesive wafers on the pouch.  When you travel, carry your ostomy supplies with you at all times. Keep them within easy reach. Do not pack ostomy supplies in baggage that will be checked or otherwise separated from you, because your baggage might be lost. If youre traveling out of the country, it is helpful to have a letter stating that you are carrying ostomy supplies as a medical necessity.  If you need ostomy supplies while traveling, look in the yellow pages of the telephone book under Surgical Supplies. Or call the local ostomy organization to find out where supplies are available.  Do not let your ostomy supplies get low. Always order new pouches before you use the last one.  Reducing Odor Limit foods such as broccoli, cabbage, onions, fish, and garlic  in your diet to help reduce odor. Each time you empty your pouch, carefully clean the opening of the pouch, both inside and outside, with toilet paper. Rinse your pouch 1 or 2 times daily after you empty it (see directions for emptying  your pouch and going away from home). Add deodorant (such as Super Banish or Nullo) to your pouch. Use air deodorizers in your bathroom. Do not add aspirin to your pouch. Even though aspirin can help prevent odor, it could cause ulcers on your stoma.  When to call the doctor Call the doctor if you have any of the following symptoms: Purple, black, or white stoma Severe cramps lasting more than 6 hours Severe watery discharge from the stoma lasting more than 6 hours No output from the colostomy for 3 days Excessive bleeding from your stoma Swelling of your stoma to more than 1/2-inch larger than usual Pulling inward of your stoma below skin level Severe skin irritation or deep ulcers Bulging or other changes in your abdomen  When to call your ostomy nurse Call your ostomy/enterostomal therapy (ET) nurse if any of the following occurs: Frequent leaking of your pouching system Change in size or appearance of your stoma, causing discomfort or problems with your pouch Skin rash or rawness Weight gain or loss that causes problems with your pouch      FREQUENTLY ASKED QUESTIONS   Why havent you met any of these folks who have an ostomy?  Well, maybe you have! You just did not recognize them because an ostomy doesn't show. It can be kept secret if you wish. Why, maybe some of your best friends, office associates or neighbors have an ostomy ... you never can tell.   People facing ostomy surgery have many quality-of-life questions like:  Will you bulge? Smell? Make noises? Will you feel waste leaving your body? Will you be a captive of the toilet? Will you starve? Be a social outcast? Get/stay married? Have babies? Easily bathe, go swimming, bend over?   OK, lets look at what you can expect:   Will you bulge?  Remember, without part of the intestine or bladder, and its contents, you should have a flatter tummy than before. You can expect to wear, with little exception, what you wore before surgery ... and this in-cludes tight clothing and bathing suits.   Will you smell?  Today, thanks to modern odor proof pouching systems, you can walk into an ostomy support group meeting and not smell anything that is foul or offensive. And, for those with an ileostomy or colostomy who are concerned about odor when emptying their pouch, there are in-pouch deodorants that can be used to eliminate any waste odors that may exist.   Will you make noises?  Everyone produces gas, especially if they are an air-swallower. But intestinal sounds that occur from time to time are no differ-ent than a gurgling tummy, and quite often your clothing will muffle any sounds.   Will you feel the waste discharges?  For those with a colostomy or ileostomy there might be a slight pressure when waste leaves your body, but understand that the intestines have no nerve endings, so there will be no unpleasant sensations. Those with a urostomy will probably be unaware of any kidney drainage.   Will you be a captive of the toilet?  Immediately post-op you will spend more time in the bathroom than you will after your body recovers from surgery. Every person is different, but on average those with an ileostomy or urostomy may empty their pouches 4 to 6 times a day; a little  less if you have a colostomy. The average wear time between pouch system changes is 3 to 5 days and the changing process should take less than  30 minutes.   Will I need to be on a special diet? Most people return to their normal diet when they have recovered from surgery. Be sure to chew your food well, eat a well-balanced diet and drink plenty of fluids. If you experience problems with a certain food, wait a couple of  weeks and try it again.  Will there be odor and noises? Pouching systems are designed to be odor-proof or odor-resistant. There are deodorants that can be used in the pouch. Medications are also available to help reduce odor. Limit gas-producing foods and carbonated beverages. You will experience less gas and fewer noises as you heal from surgery.  How much time will it take to care for my ostomy? At first, you may spend a lot of time learning about your ostomy and how to take care of it. As you become more comfortable and skilled at changing the pouching system, it will take very little time to care for it.   Will I be able to return to work? People with ostomies can perform most jobs. As soon as you have healed from surgery, you should be able to return to work. Heavy lifting (more than 10 pounds) may be discouraged.   What about intimacy? Sexual relationships and intimacy are important and fulfilling aspects of your life. They should continue after ostomy surgery. Intimacy-related concerns should be discussed openly between you and your partner.   Can I wear regular clothing? You do not need to wear special clothing. Ostomy pouches are fairly flat and barely noticeable. Elastic undergarments will not hurt the stoma or prevent the ostomy from functioning.   Can I participate in sports? An ostomy should not limit your involvement in sports. Many people with ostomies are runners, skiers, swimmers or participate in other active lifestyles. Talk with your caregiver first before doing heavy physical activity.  Will you starve?  Not if you follow doctors orders at each stage of your post-op adjustment. There is no such thing as an ostomy diet. Some people with an ostomy will be able to eat and tolerate anything; others may find diffi-culty with some foods. Each person is an individual and must determine, by trial, what is best for them. A good practice for all is to drink plenty of water.    Will you be a social outcast?  Have you met anyone who has an ostomy and is a social outcast? Why should you be the first? Only your attitude and self image will effect how you are treated. No confi-dent person is an Occupational psychologist.     PROFESSIONAL HELP  Resources are available if you need help or have questions about your ostomy.    Specially trained nurses called Wound, Ostomy Continence Nurses (WOCN) are available for consultation in most major medical centers.   Consider getting an ostomy consult at one of the outpatient ostomy clinics.  [facility] one in [place] at this time   The [employer] (UOA) is a group made up of many local chapters throughout the [place]. These local groups hold meetings and provide support to prospective and existing ostomates. They sponsor educational events and have qualified visitors to make personal or telephone visits. Contact the UOA for the chapter nearest you and for other educational publications.   More detailed information can be found in Colostomy Guide, a publication of the [employer] (UOA). Contact UOA at [phone] or visit their web site at [email or web address].com/. The website contains links to other sites,  suppliers and resources.   Tree surgeon Start Services:  Start at the website to enlist for support.  Your Wound Ostomy (WOCN) nurse may have started this process. https://www.hollister.com/en/securestart  Secure Start services are designed to support people as they live their lives with an ostomy or neurogenic bladder. Enrolling is easy and at no cost to the patient. We realize that each person's needs and life journey are different. Through Secure Start services, we want to help people live their life, their way.   Indwelling Urinary Catheter Care, Adult Taking good care of your catheter will keep it working properly and help prevent problems from developing. How to wear your catheter Attach your catheter  to your leg with adhesive tape or a leg strap. Make sure there is no tension on the catheter. If you use adhesive tape, first remove any sticky residue from the previous tape you used. How to wear a drainage bag  You should have received a large overnight drainage bag and a smaller leg bag that fits underneath clothing. You may wear the overnight bag at any time, but you should never wear the smaller leg bag at night.  Always keep the overnight drainage bag below the level of your bladder, but keep it off the floor. When you sleep, hang the bag inside a wastebasket that is covered by a clean plastic bag.  Always wear the leg bag below your knee. Keep the leg bag secure with a leg strap or adhesive tape. How to care for your skin  Clean the skin around the catheter at least once every day.  Shower every day. Do not take baths.  Apply creams, lotions, or ointments to your genital area only as told by your health care provider.  Do not use powders, sprays, or lotions on your genital area. How to clean your catheter and your skin 1. Wash your hands with soap and water. 2. Wet a washcloth in warm water and mild soap. 3. Use the washcloth to clean the skin where the catheter enters your body. Clean downward, wiping away from the catheter in small circles. Do not wipe toward the catheter. This can push bacteria into the urethra and cause infection. 4. Pat the area dry with a clean towel. Make sure to remove all soap. How to care for your drainage bags Empty your drainage bag when it is ?- full, or at least 2-3 times a day. Replace your drainage bag once a month or sooner if it starts to smell bad or look dirty. Do not clean your drainage bag unless told by your health care provider. Emptying a drainage bag  Supplies Needed  Rubbing alcohol.  Gauze pad or cotton ball.  Adhesive tape or a leg strap.  Steps 1. Wash your hands with soap and water. 2. Detach the drainage bag from your  leg. 3. Hold the drainage bag over the toilet or a clean container. Keep the drainage bag below your hips and bladder. This stops urine from going back into the tubing and into your bladder. 4. Open the pour spout at the bottom of the bag. 5. Empty the urine into the toilet or container. Do not let the pour spout touch any surface. This helps keep bacteria out of the bag and helps prevent infection. 6. Apply rubbing alcohol to a gauze pad or cotton ball. 7. Use the gauze pad or cotton ball to clean the pour spout. 8. Close the pour spout. 9. Attach the bag to your leg with  adhesive tape or a leg strap. 10. Wash your hands.  Changing a drainage bag Supplies Needed  Alcohol wipes.  A clean drainage bag.  Adhesive tape or a leg strap.  Steps 1. Wash your hands with soap and water. 2. Detach the dirty drainage bag from your leg. 3. Pinch the rubber catheter with your fingers so that urine does not spill out. 4. Disconnect the catheter tube from the drainage tube at the connection valve. Do not let the tubes touch any surface. 5. Clean the end of the catheter tube with an alcohol wipe. Use a different alcohol wipe to clean the end of the drainage tube. 6. Connect the catheter tube to the drainage tube of the clean drainage bag. 7. Attach the new bag to your leg with adhesive tape or a leg strap. Avoid attaching the new bag too tightly. 8. Wash your hands.  How to prevent infection and other problems  Never pull on your catheter or try to remove it. Pulling can damage your internal tissues.  Always wash your hands before and after handling your catheter.  If a leg strap gets wet, replace it with a dry one.  Drink enough fluids to keep your urine clear or pale yellow, or as told by your health care provider.  Do not let the drainage bag or tubing touch the floor.  Wear cotton underwear. Cotton absorbs moisture and keeps your skin dry.  If you are female, wipe from front to back  after each bowel movement.  Check the catheter often to make sure that it works properly and the tubing is not twisted or curled. Contact a health care provider if:  Your urine is cloudy.  Your urine smells unusually bad.  Your urine is not draining into the bag.  Your catheter gets clogged.  Your catheter starts to leak.  Your bladder feels full. Get help right away if:  You have redness, swelling, or pain where the catheter enters your body.  You have fluid, pus, or a bad smell coming from the area where the catheter enters your body.  The area where the catheter enters your body feels warm to the touch.  You have a fever.  You have pain in your abdomen, legs, lower back, or bladder.  You see blood fill the catheter.  Your urine is pink or red.  You have nausea, vomiting, or chills.  Your catheter gets pulled out. This information is not intended to replace advice given to you by your health care provider. Make sure you discuss any questions you have with your health care provider. Document Released: 07/19/2005 Document Revised: 06/16/2016 Document Reviewed: 01/01/2014 Elsevier Interactive Patient Education  2018 [employer].

## 2017-04-26 DIAGNOSIS — Z932 Ileostomy status: Secondary | ICD-10-CM | POA: Diagnosis not present

## 2017-04-26 DIAGNOSIS — Z452 Encounter for adjustment and management of vascular access device: Secondary | ICD-10-CM | POA: Diagnosis not present

## 2017-04-26 DIAGNOSIS — Z960 Presence of urogenital implants: Secondary | ICD-10-CM | POA: Diagnosis not present

## 2017-04-28 DIAGNOSIS — D708 Other neutropenia: Secondary | ICD-10-CM | POA: Diagnosis not present

## 2017-04-28 DIAGNOSIS — Z960 Presence of urogenital implants: Secondary | ICD-10-CM | POA: Diagnosis not present

## 2017-04-28 DIAGNOSIS — Z452 Encounter for adjustment and management of vascular access device: Secondary | ICD-10-CM | POA: Diagnosis not present

## 2017-04-28 DIAGNOSIS — Z932 Ileostomy status: Secondary | ICD-10-CM | POA: Diagnosis not present

## 2017-04-28 DIAGNOSIS — N9912 Postprocedural urethral stricture, female: Secondary | ICD-10-CM | POA: Diagnosis not present

## 2017-04-29 DIAGNOSIS — Z933 Colostomy status: Secondary | ICD-10-CM | POA: Diagnosis not present

## 2017-04-29 DIAGNOSIS — Z932 Ileostomy status: Secondary | ICD-10-CM | POA: Diagnosis not present

## 2017-04-29 DIAGNOSIS — Z452 Encounter for adjustment and management of vascular access device: Secondary | ICD-10-CM | POA: Diagnosis not present

## 2017-04-29 DIAGNOSIS — Z960 Presence of urogenital implants: Secondary | ICD-10-CM | POA: Diagnosis not present

## 2017-04-30 DIAGNOSIS — Z960 Presence of urogenital implants: Secondary | ICD-10-CM | POA: Diagnosis not present

## 2017-04-30 DIAGNOSIS — Z452 Encounter for adjustment and management of vascular access device: Secondary | ICD-10-CM | POA: Diagnosis not present

## 2017-04-30 DIAGNOSIS — Z932 Ileostomy status: Secondary | ICD-10-CM | POA: Diagnosis not present

## 2017-04-30 DIAGNOSIS — E860 Dehydration: Secondary | ICD-10-CM | POA: Diagnosis not present

## 2017-04-30 DIAGNOSIS — J9601 Acute respiratory failure with hypoxia: Secondary | ICD-10-CM | POA: Diagnosis not present

## 2017-05-02 DIAGNOSIS — Z960 Presence of urogenital implants: Secondary | ICD-10-CM | POA: Diagnosis not present

## 2017-05-02 DIAGNOSIS — Z933 Colostomy status: Secondary | ICD-10-CM | POA: Diagnosis not present

## 2017-05-02 DIAGNOSIS — N135 Crossing vessel and stricture of ureter without hydronephrosis: Secondary | ICD-10-CM | POA: Diagnosis not present

## 2017-05-02 DIAGNOSIS — N9912 Postprocedural urethral stricture, female: Secondary | ICD-10-CM | POA: Diagnosis not present

## 2017-05-02 DIAGNOSIS — D708 Other neutropenia: Secondary | ICD-10-CM | POA: Diagnosis not present

## 2017-05-02 DIAGNOSIS — Z452 Encounter for adjustment and management of vascular access device: Secondary | ICD-10-CM | POA: Diagnosis not present

## 2017-05-02 DIAGNOSIS — N1330 Unspecified hydronephrosis: Secondary | ICD-10-CM | POA: Diagnosis not present

## 2017-05-02 DIAGNOSIS — Z932 Ileostomy status: Secondary | ICD-10-CM | POA: Diagnosis not present

## 2017-05-03 ENCOUNTER — Ambulatory Visit
Admission: RE | Admit: 2017-05-03 | Discharge: 2017-05-03 | Disposition: A | Discharge status: Home / Self Care | Payer: BLUE CROSS/BLUE SHIELD | Source: Ambulatory Visit | Attending: Surgery | Admitting: Surgery

## 2017-05-03 ENCOUNTER — Ambulatory Visit
Admission: RE | Admit: 2017-05-03 | Discharge: 2017-05-03 | Disposition: A | Discharge status: Home / Self Care | Payer: BLUE CROSS/BLUE SHIELD | Source: Ambulatory Visit | Attending: Radiology | Admitting: Radiology

## 2017-05-03 DIAGNOSIS — N132 Hydronephrosis with renal and ureteral calculous obstruction: Secondary | ICD-10-CM | POA: Diagnosis not present

## 2017-05-03 DIAGNOSIS — N739 Female pelvic inflammatory disease, unspecified: Secondary | ICD-10-CM | POA: Diagnosis not present

## 2017-05-03 DIAGNOSIS — N399 Disorder of urinary system, unspecified: Secondary | ICD-10-CM | POA: Diagnosis not present

## 2017-05-03 DIAGNOSIS — N135 Crossing vessel and stricture of ureter without hydronephrosis: Secondary | ICD-10-CM | POA: Diagnosis not present

## 2017-05-03 DIAGNOSIS — Z4589 Encounter for adjustment and management of other implanted devices: Secondary | ICD-10-CM | POA: Diagnosis not present

## 2017-05-03 HISTORY — PX: IR RADIOLOGIST EVAL & MGMT: IMG5224

## 2017-05-03 MED ORDER — IOPAMIDOL (ISOVUE-300) INJECTION 61%
100.0000 mL | Freq: Once | INTRAVENOUS | Status: AC | PRN
  Administered 2017-05-03: 100 mL via INTRAVENOUS

## 2017-05-03 NOTE — Progress Notes (Signed)
Patient ID: [name], female   DOB: 12/31/1955, 61 y.o.   MRN: [id number]   Referring Physician(s): Dr. Alexis Frock and Dr. Michael [name]  Chief Complaint: The patient is seen in follow up today s/p percutaneous drain placement on 04-16-17   History of present illness:  This is a pleasant 61 yo female with a very complex past surgical history who recently underwent a diverting ileostomy with a robotic right ureteral reimplantation of with psoas hitch.  She has had a foley catheter in place since this time.  However, postoperatively, she developed a fluid collection that required drainage.  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  She was discharged with all of this in place.  She followed up with Dr. Tresa Moore yesterday and underwent a cystoscopy with no evidence of bladder leakage.  Her JP drain was having minimal output so her foley was removed.  Unfortunately, she then had about 475cc of urine like output last night.  She called the office and went in today for foley catheter replacement.  She presents to see IR today for a repeat CT scan and drain injection.     Past Medical History:  Diagnosis Date  . Acute bacterial bronchitis 06/04/2015  . Anemia in neoplastic disease   . Benign essential HTN   . Breast cancer of upper-inner quadrant of left female breast ([place])   . 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 Cancer of corpus uteri, except isthmus [facility]) dx 10-15-2004 oncologist-- dr Denman [name] and dr Alvy Bimler     dx endometrial and ovarian cancer s/p  chemotheapy and surgery:  recurrent 11-19-2014 w/ radiation 01-29-2015 to 03-10-2015  . Cellulitis of left abdominal wall near colostomy 12/03/2014  . Chronic idiopathic neutropenia (HCC)    presumed related to chemotherapy March 2006--- followed by dr Alvy Bimler   .  Complication of anesthesia    PONV  . Diabetic retinopathy, background ([place])   . DM type 2 (diabetes mellitus, type 2) ([place])    monitored by dr Legrand Como altheimer  . Dysuria   . GERD (gastroesophageal reflux disease)    takes omeprazole  . Hiatal hernia   . History of bronchitis   . History of gastric polyp    2014  duodenum  . History of radiation therapy    01-29-2015 to 03-10-2015  pelvis 50.4Gy  . Hypothyroidism    monitored by dr Legrand Como altheimer  . Malignant neoplasm of corpus uteri, except isthmus ([place]) 05/10/2012  . Malignant neoplasm of ovary ([place]) 05/10/2012  . Mixed dyslipidemia   . Multiple thyroid nodules    Managed by Dr. Harlow Asa  . PONV (postoperative nausea and vomiting)    "scopolamine patch works for me"  . Radiation-induced dermatitis    contact dermatitis , radiation completed, rash only on ankles now.  . Reactive thrombocytosis 11/29/2014  . Right flank pain   . S/P colostomy ([place])   . Seasonal allergies   . Shoulder stiffness    bilateral   . Ureteral stricture, right UROLOGIT-  DR [facility]   CHRONIC--  TREATMENT URETERAL STENT  . Vitamin D deficiency   . Wears glasses     Past Surgical History:  Procedure Laterality Date  . APPENDECTOMY    . BREAST RECONSTRUCTION WITH PLACEMENT OF TISSUE EXPANDER AND FLEX HD (ACELLULAR HYDRATED DERMIS) Bilateral 12/11/2015  Procedure: BILATERAL BREAST RECONSTRUCTION WITH PLACEMENT OF TISSUE EXPANDERS;  Surgeon: Irene Limbo, MD;  Location: [facility];  Service: Plastics;  Laterality: Bilateral;  . COLONOSCOPY WITH PROPOFOL N/A 08/21/2013   Procedure: COLONOSCOPY WITH PROPOFOL;  Surgeon: Cleotis Nipper, MD;  [facility];  Service: Endoscopy;  Laterality: N/A;  . COLOSTOMY TAKEDOWN N/A 12/04/2014   Procedure: LAPROSCOPIC LYSIS OF ADHESIONS, SPLENIC MOBILIZATION, RELOCATION OF COLOSTOMY, DEBRIDEMENT INITIAL COLOSTOMY SITE;  Surgeon: Michael [name], MD;  [facility];  Service: General;  Laterality: N/A;  .  CYSTOSCOPY W/ RETROGRADES Right 11/21/2015   Procedure: CYSTOSCOPY WITH RETROGRADE PYELOGRAM;  Surgeon: Alexis Frock, MD;  [facility];  Service: Urology;  Laterality: Right;  . CYSTOSCOPY W/ URETERAL STENT PLACEMENT Right 11/21/2015   Procedure: CYSTOSCOPY WITH STENT REPLACEMENT;  Surgeon: Alexis Frock, MD;  [facility];  Service: Urology;  Laterality: Right;  . CYSTOSCOPY W/ URETERAL STENT PLACEMENT Right 03/10/2016   Procedure: CYSTOSCOPY WITH STENT REPLACEMENT;  Surgeon: Alexis Frock, MD;  Location[facility];  Service: Urology;  Laterality: Right;  . CYSTOSCOPY W/ URETERAL STENT PLACEMENT Right 06/30/2016   Procedure: CYSTOSCOPY WITH RETROGRADE PYELOGRAM/URETERAL STENT EXCHANGE;  Surgeon: Alexis Frock, MD;  Location: [facility];  Service: Urology;  Laterality: Right;  . CYSTOSCOPY WITH RETROGRADE PYELOGRAM, URETEROSCOPY AND STENT PLACEMENT Right 03/20/2015   Procedure: CYSTOSCOPY WITH RETROGRADE PYELOGRAM, URETEROSCOPY WITH BALLOON DILATION AND STENT PLACEMENT ON RIGHT;  Surgeon: Alexis Frock, MD;  Location[facility];  Service: Urology;  Laterality: Right;  . CYSTOSCOPY WITH RETROGRADE PYELOGRAM, URETEROSCOPY AND STENT PLACEMENT Right 05/02/2015   Procedure: CYSTOSCOPY WITH RIGHT RETROGRADE PYELOGRAM,  DIAGNOSTIC URETEROSCOPY AND STENT PULL ;  Surgeon: Alexis Frock, MD;  Location: [facility];  Service: Urology;  Laterality: Right;  . CYSTOSCOPY WITH RETROGRADE PYELOGRAM, URETEROSCOPY AND STENT PLACEMENT Right 09/05/2015   Procedure: CYSTOSCOPY WITH RETROGRADE PYELOGRAM,  AND STENT PLACEMENT;  Surgeon: Alexis Frock, MD;  [facility];  Service: Urology;  Laterality: Right;  . CYSTOSCOPY WITH RETROGRADE PYELOGRAM, URETEROSCOPY AND STENT PLACEMENT Right 04/01/2017   Procedure: CYSTOSCOPY WITH RETROGRADE PYELOGRAM, URETEROSCOPY AND STENT PLACEMENT;  Surgeon: Alexis Frock, MD;  [facility];  Service: Urology;   Laterality: Right;  . CYSTOSCOPY WITH STENT PLACEMENT Right 10/27/2016   Procedure: CYSTOSCOPY WITH STENT CHANGE and right retrograde pyelogram;  Surgeon: Alexis Frock, MD;  [facility];  Service: Urology;  Laterality: Right;  . EUS N/A 10/02/2014   Procedure: LOWER ENDOSCOPIC ULTRASOUND (EUS);  Surgeon: Arta Silence, MD;  Location: [facility];  Service: Endoscopy;  Laterality: N/A;  . EXCISION SOFT TISSUE MASS RIGHT FOREMAN  12-08-2006  . EYE SURGERY  as child   pytosis of eyelids repair  . INCISION AND DRAINAGE OF WOUND Bilateral 12/26/2015   Procedure: DEBRIDEMENT OF BILATERAL MASTECTOMY FLAPS;  Surgeon: Irene Limbo, MD;  Location: [place];  Service: Plastics;  Laterality: Bilateral;  . IR FLUORO GUIDE CV LINE RIGHT  04/06/2017  . IR US GUIDE VASC ACCESS RIGHT  04/06/2017  . LAPAROSCOPIC CHOLECYSTECTOMY  1990  . LIPOSUCTION WITH LIPOFILLING Bilateral 04/16/2016   Procedure: LIPOSUCTION WITH LIPOFILLING TO BILATERAL CHEST;  Surgeon: Irene Limbo, MD;  Location: [place];  Service: Plastics;  Laterality: Bilateral;  . MASTECTOMY Right 12/11/2015   PROPHYLACTIC   . MASTECTOMY COMPLETE / SIMPLE W/ SENTINEL NODE BIOPSY Left 12/11/2015   AXILLARY SENTINEL LYMPH NODE BIOPSY   . MASTECTOMY W/ SENTINEL NODE BIOPSY  Bilateral 12/11/2015   Procedure: RIGHT PROPHYLACTIC MASTECTOMY, LEFT TOTAL MASTECTOMY WITH LEFT AXILLARY SENTINEL LYMPH NODE BIOPSY;  Surgeon: Stark Klein, MD;  Location: [place];  Service: General;  Laterality: Bilateral;  . OSTOMY N/A 11/19/2014   Procedure: OSTOMY;  Surgeon: Michael [name], MD;  [facility];  Service: General;  Laterality: N/A;  . PROCTOSCOPY N/A 04/01/2017   Procedure: RIDGE PROCTOSCOPY;  Surgeon: Michael [name], MD;  [facility];  Service: General;  Laterality: N/A;  . REMOVAL OF BILATERAL TISSUE EXPANDERS WITH PLACEMENT OF BILATERAL BREAST IMPLANTS Bilateral 04/16/2016   Procedure: REMOVAL OF  BILATERAL TISSUE EXPANDERS WITH PLACEMENT OF BILATERAL BREAST IMPLANTS;  Surgeon: Irene Limbo, MD;  Location: [place];  Service: Plastics;  Laterality: Bilateral;  . ROBOTIC ASSISTED LAP VAGINAL HYSTERECTOMY N/A 11/19/2014   Procedure: ROBOTIC LYSIS OF ADHESIONS, CONVERTED TO LAPAROTOMY RADICAL UPPER VAGINECTOMY,LOW ANTERIOR BOWEL RESECTION, COLOSTOMY, BILATERAL URETERAL STENT PLACEMENT AND CYSTONOMY CLOSURE;  Surgeon: Everitt Amber, MD;  [facility];  Service: Gynecology;  Laterality: N/A;  . TISSUE EXPANDER FILLING Bilateral 12/26/2015   Procedure: EXPANSION OF BILATERAL CHEST TISSUE EXPANDERS (60 mL- Right; 75 mL- Left);  Surgeon: Irene Limbo, MD;  Location: [place];  Service: Plastics;  Laterality: Bilateral;  . TONSILLECTOMY    . TOTAL ABDOMINAL HYSTERECTOMY  March 2006   [facility]   and Bilateral Salpingoophorectomy/  staging for Ovarian cancer/  an  . XI ROBOTIC ASSISTED LOWER ANTERIOR RESECTION N/A 04/01/2017   Procedure: XI ROBOTIC VS LAPAROSCOPIC COLOSTOMY TAKEDOWN WITH LYSIS OF ADHESIONS.;  Surgeon: Michael [name], MD;  [facility];  Service: General;  Laterality: N/A;  ERAS PATHWAY    Allergies: Penicillins; Ultram [tramadol]; Adhesive [tape]; Cefaclor; Erythromycin; Trimethoprim; Ciprofloxacin; Fluconazole; Oxycodone; Pectin; and Sulfa antibiotics  Medications: Prior to Admission medications   Medication Sig Start Date End Date Taking? Authorizing Provider  anastrozole (ARIMIDEX) 1 MG tablet TAKE 1 TABLET DAILY 12/08/16   Heath Lark, MD  aspirin EC 81 MG tablet Take 81 mg by mouth daily.    [provider]  aztreonam (AZACTAM) 2 GM/50ML Inject 50 mLs (2 g total) into the vein every 8 (eight) hours. Labs - Once weekly:  CBC/D and BMP, Labs - Every other week:  ESR and CRP  Dispense 75 doses 04/25/17 05/20/17  Michael [name], MD  Biotin 5 MG TABS Take 5 mg by mouth every morning.     [provider]    Calcium-Magnesium-Vitamin D 400-166.7-133.3 MG-MG-UNIT TABS Take 1 tablet by mouth daily.     [provider]  Cholecalciferol (VITAMIN D3) 10000 UNITS capsule Take 10,000 Units by mouth once a week. Sunday evening's    [provider]  diphenhydrAMINE (BENADRYL) 25 MG tablet Take 25 mg by mouth at bedtime as needed for itching or sleep. Reported on 09/15/2015    [provider]  filgrastim (NEUPOGEN) 480 MCG/1.6ML injection Inject 1.6 ml under the skin every 5 days for life Patient taking differently: Inject 480 mcg into the skin See admin instructions. Inject 1.6 ml under the skin every 6 days for life 01/06/17   Heath Lark, MD  HYDROmorphone (DILAUDID) 2 MG tablet Take 1-2 tablets (2-4 mg total) by mouth every 4 (four) hours as needed for moderate pain or severe pain. 04/07/17   Michael [name], MD  levothyroxine (SYNTHROID, LEVOTHROID) 150 MCG tablet Take 150 mcg by mouth daily.    [provider]  loperamide (IMODIUM) 2 MG capsule Take 1-2 capsules (2-4 mg total)  by mouth every 8 (eight) hours as needed for diarrhea or loose stools (Use if >2 BM every 8 hours). 04/07/17   Michael [name], MD  loratadine (CLARITIN) 10 MG tablet Take 10 mg by mouth daily.     [provider]  losartan (COZAAR) 100 MG tablet Take 1 tablet (100 mg total) by mouth daily. Patient not taking: Reported on 04/16/2017 03/18/17   Heath Lark, MD  metFORMIN (GLUCOPHAGE) 1000 MG tablet Take 1,000 mg by mouth 2 (two) times daily with a meal.     [provider]  methocarbamol (ROBAXIN) 500 MG tablet Take 500 mg by mouth every 4 (four) hours as needed for muscle spasms.  04/14/17   [provider]  omega-3 acid ethyl esters (LOVAZA) 1 G capsule Take 1 g by mouth 2 (two) times daily.    [provider]  ondansetron (ZOFRAN-ODT) 4 MG disintegrating tablet Take 4 mg by mouth every 6 (six) hours as needed for nausea or vomiting.  04/12/17   [provider]   Polyethyl Glycol-Propyl Glycol (SYSTANE OP) Place 1 drop into both eyes at bedtime.     [provider]  Prenatal Vit-Fe Fumarate-FA (PRENATAL VITAMIN PO) Take 1 capsule by mouth daily. Takes prenatal because there are no dyes in it    [provider]  rosuvastatin (CRESTOR) 10 MG tablet Take 10 mg by mouth every evening.     [provider]  sitaGLIPtin (JANUVIA) 100 MG tablet Take 100 mg by mouth every morning.     [provider]  vancomycin IVPB Inject 500 mg into the vein every 12 (twelve) hours. Indication:  Intra-abdominal infection Last Day of Therapy:  10/19 Labs - Sunday/Monday:  CBC/D, BMP, and vancomycin trough. Labs - Thursday:  BMP and vancomycin trough Labs - Every other week:  ESR and CRP 04/25/17 05/20/17  Michael [name], MD     Family History  Problem Relation Age of Onset  . Cancer Mother 20       stomach ca  . Hypertension Mother   . Cancer Father 13       prostate ca  . Diabetes Father   . Heart disease Father        CABG  . Breast cancer Maternal Aunt        dx in her 5s  . Lymphoma Paternal Aunt   . Brain cancer Paternal Grandfather   . Ovarian cancer Other   . Diabetes Sister   . Hypertension Brother y-10  . Heart disease Brother        CABG  . Diabetes Brother     Social History   Social History  . Marital status: Married    Spouse name: N/A  . Number of children: 1  . Years of education: N/A   Occupational History  . retired Therapist, sports from [place] History Main Topics  . Smoking status: Never Smoker  . Smokeless tobacco: Never Used  . Alcohol use Yes     Comment: rare social  . Drug use: Unknown  . Sexual activity: Not Currently   Other Topics Concern  . Not on file   Social History Narrative   Exercise-- has not gotten back into it since cancer came back     Vital Signs: BP (!) 151/80   Pulse 84   SpO2 95%   Physical Exam  Abd: soft, ileostomy in place.  RLQ drain in place with minima  output now since foley replace.  Drain is injected and shows almost instant filling of the urinary bladder.  Drain was placed to a gravity bag given this persistent connection.  Imaging: No results found.  Labs:  CBC:  Recent Labs  04/20/17 0510 04/21/17 0416 04/22/17 0430 04/23/17 0431  WBC 24.8* 25.4* 24.0* 15.0*  HGB 7.7* 7.7* 8.0* 8.1*  HCT 24.0* 24.4* 24.8* 25.2*  PLT 417* 411* 398 362    COAGS:  Recent Labs  04/16/17 0047  INR 1.21    BMP:  Recent Labs  04/20/17 0510 04/21/17 0416 04/22/17 0430 04/23/17 0431  NA 138 136 137 139  K 4.4 4.8 5.0 5.3*  CL 104 103 104 104  CO2 _0 GLUCOSE 156* 161* 147* 113*  BUN _1 22*  CALCIUM 8.1* 8.1* 8.3* 8.4*  CREATININE 0.80 0.85 1.03* 0.91  GFRNONAA >60 >60 57* >60  GFRAA >60 >60 >60 >60    LIVER FUNCTION TESTS:  Recent Labs  04/17/17 0529 04/18/17 0427 04/20/17 0510 04/21/17 0416  BILITOT 0.3 0.4 0.4 0.5  AST 14* 13* 13* 13*  ALT 12* 11* 10* 10*  ALKPHOS 202* 176* 177* 190*  PROT 5.5* 6.0* 5.8* 5.9*  ALBUMIN 1.7* 1.8* 1.8* 1.8*    Assessment:  1. 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  At this point, we will recommend the patient return to see IR for drain removal when felt appropriate by Dr. Tresa Moore.  Her JP drain was replaced with a gravity bag to minimize suction on the connection.  The patient understands this plan.  Signed: Henreitta Cea, PA-C 05/03/2017, 2:41 PM   Please refer to Dr. Moises Blood attestation of this note for management and plan.

## 2017-05-04 ENCOUNTER — Other Ambulatory Visit: Payer: Self-pay | Admitting: Pharmacist

## 2017-05-05 DIAGNOSIS — Z933 Colostomy status: Secondary | ICD-10-CM | POA: Diagnosis not present

## 2017-05-05 DIAGNOSIS — Z960 Presence of urogenital implants: Secondary | ICD-10-CM | POA: Diagnosis not present

## 2017-05-05 DIAGNOSIS — D708 Other neutropenia: Secondary | ICD-10-CM | POA: Diagnosis not present

## 2017-05-05 DIAGNOSIS — Z932 Ileostomy status: Secondary | ICD-10-CM | POA: Diagnosis not present

## 2017-05-05 DIAGNOSIS — N9912 Postprocedural urethral stricture, female: Secondary | ICD-10-CM | POA: Diagnosis not present

## 2017-05-05 DIAGNOSIS — Z452 Encounter for adjustment and management of vascular access device: Secondary | ICD-10-CM | POA: Diagnosis not present

## 2017-05-07 DIAGNOSIS — Z932 Ileostomy status: Secondary | ICD-10-CM | POA: Diagnosis not present

## 2017-05-07 DIAGNOSIS — J9601 Acute respiratory failure with hypoxia: Secondary | ICD-10-CM | POA: Diagnosis not present

## 2017-05-07 DIAGNOSIS — Z960 Presence of urogenital implants: Secondary | ICD-10-CM | POA: Diagnosis not present

## 2017-05-07 DIAGNOSIS — Z452 Encounter for adjustment and management of vascular access device: Secondary | ICD-10-CM | POA: Diagnosis not present

## 2017-05-07 DIAGNOSIS — E860 Dehydration: Secondary | ICD-10-CM | POA: Diagnosis not present

## 2017-05-09 ENCOUNTER — Encounter: Payer: Self-pay | Admitting: Internal Medicine

## 2017-05-09 DIAGNOSIS — Z960 Presence of urogenital implants: Secondary | ICD-10-CM | POA: Diagnosis not present

## 2017-05-09 DIAGNOSIS — Z452 Encounter for adjustment and management of vascular access device: Secondary | ICD-10-CM | POA: Diagnosis not present

## 2017-05-09 DIAGNOSIS — D708 Other neutropenia: Secondary | ICD-10-CM | POA: Diagnosis not present

## 2017-05-09 DIAGNOSIS — N9912 Postprocedural urethral stricture, female: Secondary | ICD-10-CM | POA: Diagnosis not present

## 2017-05-09 DIAGNOSIS — Z932 Ileostomy status: Secondary | ICD-10-CM | POA: Diagnosis not present

## 2017-05-10 DIAGNOSIS — L589 Radiodermatitis, unspecified: Secondary | ICD-10-CM | POA: Diagnosis not present

## 2017-05-10 DIAGNOSIS — N368 Other specified disorders of urethra: Secondary | ICD-10-CM | POA: Diagnosis not present

## 2017-05-10 DIAGNOSIS — K219 Gastro-esophageal reflux disease without esophagitis: Secondary | ICD-10-CM | POA: Diagnosis not present

## 2017-05-10 DIAGNOSIS — Z452 Encounter for adjustment and management of vascular access device: Secondary | ICD-10-CM | POA: Diagnosis not present

## 2017-05-11 ENCOUNTER — Other Ambulatory Visit: Payer: Self-pay | Admitting: Pharmacist

## 2017-05-12 DIAGNOSIS — D708 Other neutropenia: Secondary | ICD-10-CM | POA: Diagnosis not present

## 2017-05-12 DIAGNOSIS — N992 Postprocedural adhesions of vagina: Secondary | ICD-10-CM | POA: Diagnosis not present

## 2017-05-13 DIAGNOSIS — Z933 Colostomy status: Secondary | ICD-10-CM | POA: Diagnosis not present

## 2017-05-14 DIAGNOSIS — Z960 Presence of urogenital implants: Secondary | ICD-10-CM | POA: Diagnosis not present

## 2017-05-14 DIAGNOSIS — Z452 Encounter for adjustment and management of vascular access device: Secondary | ICD-10-CM | POA: Diagnosis not present

## 2017-05-14 DIAGNOSIS — E860 Dehydration: Secondary | ICD-10-CM | POA: Diagnosis not present

## 2017-05-14 DIAGNOSIS — J9601 Acute respiratory failure with hypoxia: Secondary | ICD-10-CM | POA: Diagnosis not present

## 2017-05-14 DIAGNOSIS — Z932 Ileostomy status: Secondary | ICD-10-CM | POA: Diagnosis not present

## 2017-05-16 DIAGNOSIS — D708 Other neutropenia: Secondary | ICD-10-CM | POA: Diagnosis not present

## 2017-05-16 DIAGNOSIS — Z452 Encounter for adjustment and management of vascular access device: Secondary | ICD-10-CM | POA: Diagnosis not present

## 2017-05-16 DIAGNOSIS — Z960 Presence of urogenital implants: Secondary | ICD-10-CM | POA: Diagnosis not present

## 2017-05-16 DIAGNOSIS — N9912 Postprocedural urethral stricture, female: Secondary | ICD-10-CM | POA: Diagnosis not present

## 2017-05-16 DIAGNOSIS — Z932 Ileostomy status: Secondary | ICD-10-CM | POA: Diagnosis not present

## 2017-05-19 ENCOUNTER — Telehealth: Payer: Self-pay

## 2017-05-19 ENCOUNTER — Ambulatory Visit (INDEPENDENT_AMBULATORY_CARE_PROVIDER_SITE_OTHER): Payer: BLUE CROSS/BLUE SHIELD | Admitting: Internal Medicine

## 2017-05-19 ENCOUNTER — Encounter: Payer: Self-pay | Admitting: Internal Medicine

## 2017-05-19 DIAGNOSIS — N739 Female pelvic inflammatory disease, unspecified: Secondary | ICD-10-CM | POA: Diagnosis not present

## 2017-05-19 DIAGNOSIS — Z960 Presence of urogenital implants: Secondary | ICD-10-CM | POA: Diagnosis not present

## 2017-05-19 DIAGNOSIS — Z452 Encounter for adjustment and management of vascular access device: Secondary | ICD-10-CM | POA: Diagnosis not present

## 2017-05-19 DIAGNOSIS — D708 Other neutropenia: Secondary | ICD-10-CM | POA: Diagnosis not present

## 2017-05-19 DIAGNOSIS — N9912 Postprocedural urethral stricture, female: Secondary | ICD-10-CM | POA: Diagnosis not present

## 2017-05-19 DIAGNOSIS — Z932 Ileostomy status: Secondary | ICD-10-CM | POA: Diagnosis not present

## 2017-05-19 NOTE — Telephone Encounter (Signed)
Verbal order per Dr Megan Salon to stop antibiotics and pull pick today given to [facility] at [place].  Order repeated and verified Pola Corn, LPN

## 2017-05-19 NOTE — Progress Notes (Signed)
[facility]  Patient Active Problem List   Diagnosis Date Noted  . Pelvic abscess in female 04/16/2017    Priority: High  . 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 Ileus ([place]) 04/18/2017  . Protein-calorie malnutrition, moderate ([place]) 04/18/2017  . Postoperative anemia 04/04/2017  . Hot flashes related to aromatase inhibitor therapy 03/18/2017  . Ureteral stricture, right, s/p resection & bladder hitch reimplantation 04/01/2017 09/10/2016  . Vitamin D insufficiency 08/30/2016  . Breast cancer of upper-inner quadrant of left female breast ([place])   . Pelvic pain in female 03/19/2015  . Chronic anemia 12/30/2014  . Pancytopenia, acquired ([place]) 11/26/2014  . Morbid obesity ([place]) 11/20/2014  . 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 Abdominal pain 09/09/2014  . Postmenopausal bleeding 09/05/2014  . History of ovarian & endometrial cancer 07/17/2012  . Chronic neutropenia ([place]) 12/14/2011  . Primary hypothyroidism 12/14/2011  . DM type 2 (diabetes mellitus, type 2) ([place]) 12/14/2011  . Benign essential HTN 12/14/2011    Patient's Medications  New Prescriptions   No medications on file  Previous Medications   ANASTROZOLE (ARIMIDEX) 1 MG TABLET    TAKE 1 TABLET DAILY   ASPIRIN EC 81 MG TABLET    Take 81 mg by mouth daily.   BIOTIN 5 MG TABS    Take 5 mg by mouth every morning.    CALCIUM-MAGNESIUM-VITAMIN D 400-166.7-133.3 MG-MG-UNIT TABS    Take 1 tablet by mouth daily.    CHOLECALCIFEROL (VITAMIN D3) 10000 UNITS CAPSULE    Take 10,000 Units by mouth once a week. Sunday evening's   DIPHENHYDRAMINE (BENADRYL) 25 MG TABLET    Take 25 mg by mouth at bedtime as needed for itching or sleep. Reported on  09/15/2015   FILGRASTIM (NEUPOGEN) 480 MCG/1.6ML INJECTION    Inject 1.6 ml under the skin every 5 days for life   HYDROMORPHONE (DILAUDID) 2 MG TABLET    Take 1-2 tablets (2-4 mg total) by mouth every 4 (four) hours as needed for moderate pain or severe pain.   LEVOTHYROXINE (SYNTHROID, LEVOTHROID) 150 MCG TABLET    Take 150 mcg by mouth daily.   LOPERAMIDE (IMODIUM) 2 MG CAPSULE    Take 1-2 capsules (2-4 mg total) by mouth every 8 (eight) hours as needed for diarrhea or loose stools (Use if >2 BM every 8 hours).   LORATADINE (CLARITIN) 10 MG TABLET    Take 10 mg by mouth daily.    LOSARTAN (COZAAR) 100 MG TABLET    Take 1 tablet (100 mg total) by mouth daily.   METFORMIN (GLUCOPHAGE) 1000 MG TABLET    Take 1,000 mg by mouth 2 (two) times daily with a meal.    METHOCARBAMOL (ROBAXIN) 500 MG TABLET    Take 500 mg by mouth every 4 (four) hours as needed for muscle spasms.    OMEGA-3 ACID ETHYL ESTERS (LOVAZA) 1 G CAPSULE    Take 1 g by mouth 2 (two) times daily.   ONDANSETRON (ZOFRAN-ODT) 4 MG DISINTEGRATING TABLET    Take 4 mg by mouth every 6 (six) hours as needed for nausea or vomiting.    POLYETHYL GLYCOL-PROPYL GLYCOL (SYSTANE OP)    Place 1 drop into both eyes at bedtime.  PRENATAL VIT-FE FUMARATE-FA (PRENATAL VITAMIN PO)    Take 1 capsule by mouth daily. Takes prenatal because there are no dyes in it   ROSUVASTATIN (CRESTOR) 10 MG TABLET    Take 10 mg by mouth every evening.    SITAGLIPTIN (JANUVIA) 100 MG TABLET    Take 100 mg by mouth every morning.   Modified Medications   No medications on file  Discontinued Medications   AZTREONAM (AZACTAM) 2 GM/50ML    Inject 50 mLs (2 g total) into the vein every 8 (eight) hours. Labs - Once weekly:  CBC/D and BMP, Labs - Every other week:  ESR and CRP  Dispense 75 doses   VANCOMYCIN IVPB    Inject 500 mg into the vein every 12 (twelve) hours. Indication:  Intra-abdominal infection Last Day of Therapy:  10/19 Labs - Sunday/Monday:  CBC/D, BMP,  and vancomycin trough. Labs - Thursday:  BMP and vancomycin trough Labs - Every other week:  ESR and CRP    Subjective: Ms. Vanantwerp is in for her hospital follow-up visit with her husband Clair Gulling. 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 She underwent TAH-BSO followed by chemotherapy. She developed a pelvic recurrence in 2016 and underwent partial colectomy and colostomy formation followed by radiation therapy. 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 On 04/01/2017 she underwent extensive surgery with lysis of adhesions, colostomy takedown, diverting loop ileostomy formation, reimplantation of her right ureter in the dome of her bladder and stent replacement. She also has a history of breast cancer diagnosed last year. She is on Arimidex.  Following discharge she developed progressive abdominal bloating, pain, nausea, vomiting and foul-smelling rectal drainage. She also developed thrush and started taking fluconazole. She then developed a slightly pruritic rash on her chest and abdomen. She began having low-grade fevers and was readmitted on 04/16/2017 with a temperature of 102.3. CT scan showed 2 pelvic fluid collections, some mesenteric air and edema and left hydroureteronephrosis. She underwent drain placement in the pelvic fluid collections yielding a large amount of bloody fluid. Drain cultures grew Enterococcus faecalis and Serratia marcescens. Because of her multiple antibiotic allergies she was treated with vancomycin and aztreonam. She has now completed 4 weeks of antibiotic therapy. Overall she is feeling better but still is bothered by nausea which he thinks is worsened by her antibiotics. She is also receiving Neupogen every 6 days which he also believes increases her nausea. She had one episode of vomiting this morning. Her acid reflux is a little worse as well. She has been getting IV fluids  through her PICC 3 times a week but feels like her oral intake now is better and that she will be able to stay hydrated without supplemental fluid.  She is having minimal drain output. She had a repeat CT scan on 05/03/2017 which showed resolution of the pelvic abscesses. A drain study was done which did show a persistent small bladder leak. She continues to have her Foley catheter in.   Review of Systems: Review of Systems  Constitutional: Positive for malaise/fatigue. Negative for chills, diaphoresis, fever and weight loss.  Respiratory: Negative for cough, sputum production and shortness of breath.   Cardiovascular: Negative for chest pain.  Gastrointestinal: Positive for abdominal pain, heartburn, nausea and vomiting.       She has been using Lomotil to try to decrease the volume of her ileostomy output. She continues to have  some pain and ear patient around her stoma.  Skin: Negative for rash.    Past Medical History:  Diagnosis Date  . Acute bacterial bronchitis 06/04/2015  . Anemia in neoplastic disease   . Benign essential HTN   . Breast cancer of upper-inner quadrant of left female breast ([place])   . 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 Cancer of corpus uteri, except isthmus [facility] dx 10-15-2004 oncologist-- dr Denman [name] and dr Alvy Bimler     dx endometrial and ovarian cancer s/p  chemotheapy and surgery:  recurrent 11-19-2014 w/ radiation 01-29-2015 to 03-10-2015  . Cellulitis of left abdominal wall near colostomy 12/03/2014  . Chronic idiopathic neutropenia (H[facility])    presumed related to chemotherapy March 2006--- followed by dr Alvy Bimler   . Complication of anesthesia    PONV  . Diabetic retinopathy, background ([place])   . DM type 2 (diabetes mellitus, type 2) ([place])    monitored by dr Legrand Como altheimer   . Dysuria   . GERD (gastroesophageal reflux disease)    takes omeprazole  . Hiatal hernia   . History of bronchitis   . History of gastric polyp    2014  duodenum  . History of radiation therapy    01-29-2015 to 03-10-2015  pelvis 50.4Gy  . Hypothyroidism    monitored by dr Legrand Como altheimer  . Malignant neoplasm of corpus uteri, except isthmus ([place]) 05/10/2012  . Malignant neoplasm of ovary ([place]) 05/10/2012  . Mixed dyslipidemia   . Multiple thyroid nodules    Managed by Dr. Harlow Asa  . PONV (postoperative nausea and vomiting)    "scopolamine patch works for me"  . Radiation-induced dermatitis    contact dermatitis , radiation completed, rash only on ankles now.  . Reactive thrombocytosis 11/29/2014  . Right flank pain   . S/P colostomy ([place])   . Seasonal allergies   . Shoulder stiffness    bilateral   . Ureteral stricture, right UROLOGIT-  DR [facility]   CHRONIC--  TREATMENT URETERAL STENT  . Vitamin D deficiency   . Wears glasses     Social History  Substance Use Topics  . Smoking status: Never Smoker  . Smokeless tobacco: Never Used  . Alcohol use Yes     Comment: rare social    Family History  Problem Relation Age of Onset  . Cancer Mother 68       stomach ca  . Hypertension Mother   . Cancer Father 41       prostate ca  . Diabetes Father   . Heart disease Father        CABG  . Breast cancer Maternal Aunt        dx in her 19s  . Lymphoma Paternal Aunt   . Brain cancer Paternal Grandfather   . Ovarian cancer Other   . Diabetes Sister   . Hypertension Brother y-10  . Heart disease Brother        CABG  . Diabetes Brother     Allergies  Allergen Reactions  . Penicillins Swelling    Facial swelling Has patient had a PCN reaction causing immediate rash, facial/tongue/throat swelling, SOB or lightheadedness with hypotension: Yes Has patient had a PCN reaction causing severe rash involving mucus membranes or skin necrosis: Yes Has patient had a PCN reaction  that  required hospitalization No Has patient had a PCN reaction occurring within the last 10 years: No If all of the above answers are "NO", then may proceed with Cephalosporin use.   [name] Kitchen Ultram [Tramadol] Hives  . Adhesive [Tape]     blisters  . Cefaclor Rash    Ceclor  . Erythromycin     Gastritis, abd cramps  . Trimethoprim Rash  . Ciprofloxacin Other (See Comments)    Unknown On Dr notes   . Fluconazole Rash  . Oxycodone     " I just feel weird"  . Pectin Rash    Pectin ring for stoma  . Sulfa Antibiotics Rash    Objective: Vitals:   05/19/17 1056  Temp: 98.3 F (36.8 C)  TempSrc: Oral  Weight: 215 lb (97.5 kg)   Body mass index is 38.7 kg/m.  Physical Exam  Constitutional:  She is pale and weak but appears much stronger than when I last saw her in the hospital. She is accompanied by her husband Clair Gulling.  HENT:  Mouth/Throat: No oropharyngeal exudate.  Cardiovascular: Normal rate and regular rhythm.   No murmur heard. Pulmonary/Chest: Effort normal and breath sounds normal.  Abdominal: Soft. She exhibits no distension.  She has only a few cc of dark brown fluid in her drain bag.    Lab Results    Problem List Items Addressed This Visit      High   Pelvic abscess in female    I am hopeful that her pelvic abscess has now been cured. She has no residual abscess seen on a recent CT scan and is having minimal drain output. She has persistent small bladder leak but hopefully this will seal spontaneously with continued decompression of her bladder with Foley catheter. I would suggest leaving the drain in place for at least a few more weeks. She may need another drain study to evaluate the leak. Let us discuss with her the range of options including optimal duration of antibiotic therapy. She is having side effects that she believes are related to her antibiotics and her nurses are having difficulty drawing blood through her PICC. After discussing management options we  decided to stop her vancomycin and aztreonam now and have the PICC removed. She will follow up here in 4 weeks.          Michel Bickers, MD [facility] for [facility] [phone] pager   [phone] cell 05/19/2017, 12:12 PM

## 2017-05-19 NOTE — Assessment & Plan Note (Signed)
I am hopeful that her pelvic abscess has now been cured. She has no residual abscess seen on a recent CT scan and is having minimal drain output. She has persistent small bladder leak but hopefully this will seal spontaneously with continued decompression of her bladder with Foley catheter. I would suggest leaving the drain in place for at least a few more weeks. She may need another drain study to evaluate the leak. Let us discuss with her the range of options including optimal duration of antibiotic therapy. She is having side effects that she believes are related to her antibiotics and her nurses are having difficulty drawing blood through her PICC. After discussing management options we decided to stop her vancomycin and aztreonam now and have the PICC removed. She will follow up here in 4 weeks.

## 2017-05-23 DIAGNOSIS — N9912 Postprocedural urethral stricture, female: Secondary | ICD-10-CM | POA: Diagnosis not present

## 2017-05-24 DIAGNOSIS — N135 Crossing vessel and stricture of ureter without hydronephrosis: Secondary | ICD-10-CM | POA: Diagnosis not present

## 2017-05-24 DIAGNOSIS — Z933 Colostomy status: Secondary | ICD-10-CM | POA: Diagnosis not present

## 2017-05-25 DIAGNOSIS — Z433 Encounter for attention to colostomy: Secondary | ICD-10-CM | POA: Diagnosis not present

## 2017-05-26 DIAGNOSIS — N9912 Postprocedural urethral stricture, female: Secondary | ICD-10-CM | POA: Diagnosis not present

## 2017-05-27 ENCOUNTER — Encounter (HOSPITAL_COMMUNITY): Payer: Self-pay

## 2017-05-27 ENCOUNTER — Emergency Department (HOSPITAL_COMMUNITY)
Admission: EM | Admit: 2017-05-27 | Discharge: 2017-05-28 | Disposition: A | Discharge status: Home / Self Care | Payer: BLUE CROSS/BLUE SHIELD | Attending: Emergency Medicine | Admitting: Emergency Medicine

## 2017-05-27 DIAGNOSIS — R110 Nausea: Secondary | ICD-10-CM | POA: Insufficient documentation

## 2017-05-27 DIAGNOSIS — Z7984 Long term (current) use of oral hypoglycemic drugs: Secondary | ICD-10-CM | POA: Insufficient documentation

## 2017-05-27 DIAGNOSIS — E11319 Type 2 diabetes mellitus with unspecified diabetic retinopathy without macular edema: Secondary | ICD-10-CM | POA: Diagnosis not present

## 2017-05-27 DIAGNOSIS — Z8542 Personal history of malignant neoplasm of other parts of uterus: Secondary | ICD-10-CM | POA: Insufficient documentation

## 2017-05-27 DIAGNOSIS — Z7982 Long term (current) use of aspirin: Secondary | ICD-10-CM | POA: Diagnosis not present

## 2017-05-27 DIAGNOSIS — I10 Essential (primary) hypertension: Secondary | ICD-10-CM | POA: Diagnosis not present

## 2017-05-27 DIAGNOSIS — Z853 Personal history of malignant neoplasm of breast: Secondary | ICD-10-CM | POA: Diagnosis not present

## 2017-05-27 DIAGNOSIS — E860 Dehydration: Secondary | ICD-10-CM | POA: Diagnosis not present

## 2017-05-27 DIAGNOSIS — Z885 Allergy status to narcotic agent status: Secondary | ICD-10-CM | POA: Insufficient documentation

## 2017-05-27 DIAGNOSIS — E039 Hypothyroidism, unspecified: Secondary | ICD-10-CM | POA: Diagnosis not present

## 2017-05-27 DIAGNOSIS — Z933 Colostomy status: Secondary | ICD-10-CM | POA: Diagnosis not present

## 2017-05-27 DIAGNOSIS — Z79899 Other long term (current) drug therapy: Secondary | ICD-10-CM | POA: Insufficient documentation

## 2017-05-27 DIAGNOSIS — Z452 Encounter for adjustment and management of vascular access device: Secondary | ICD-10-CM | POA: Diagnosis not present

## 2017-05-27 DIAGNOSIS — Z8543 Personal history of malignant neoplasm of ovary: Secondary | ICD-10-CM | POA: Insufficient documentation

## 2017-05-27 DIAGNOSIS — Z880 Allergy status to penicillin: Secondary | ICD-10-CM | POA: Insufficient documentation

## 2017-05-27 LAB — COMPREHENSIVE METABOLIC PANEL
ALT: 33 U/L (ref 14–54)
AST: 24 U/L (ref 15–41)
Albumin: 3.9 g/dL (ref 3.5–5.0)
Alkaline Phosphatase: 166 U/L — ABNORMAL HIGH (ref 38–126)
Anion gap: 12 (ref 5–15)
BUN: 73 mg/dL — ABNORMAL HIGH (ref 6–20)
CO2: 17 mmol/L — ABNORMAL LOW (ref 22–32)
Calcium: 10.7 mg/dL — ABNORMAL HIGH (ref 8.9–10.3)
Chloride: 101 mmol/L (ref 101–111)
Creatinine, Ser: 1.34 mg/dL — ABNORMAL HIGH (ref 0.44–1.00)
GFR calc Af Amer: 48 mL/min — ABNORMAL LOW (ref >60)
GFR calc non Af Amer: 41 mL/min — ABNORMAL LOW (ref >60)
Glucose, Bld: 155 mg/dL — ABNORMAL HIGH (ref 65–99)
Potassium: 4.5 mmol/L (ref 3.5–5.1)
Sodium: 130 mmol/L — ABNORMAL LOW (ref 135–145)
Total Bilirubin: 0.3 mg/dL (ref 0.3–1.2)
Total Protein: 9.2 g/dL — ABNORMAL HIGH (ref 6.5–8.1)

## 2017-05-27 LAB — URINALYSIS, ROUTINE W REFLEX MICROSCOPIC
Bilirubin Urine: NEGATIVE
Glucose, UA: NEGATIVE mg/dL
Ketones, ur: NEGATIVE mg/dL
Nitrite: NEGATIVE
Protein, ur: 100 mg/dL — AB
Specific Gravity, Urine: 1.02 (ref 1.005–1.030)
pH: 6 (ref 5.0–8.0)

## 2017-05-27 LAB — CBC
HCT: 42.6 % (ref 36.0–46.0)
Hemoglobin: 14.6 g/dL (ref 12.0–15.0)
MCH: 29.2 pg (ref 26.0–34.0)
MCHC: 34.3 g/dL (ref 30.0–36.0)
MCV: 85.2 fL (ref 78.0–100.0)
Platelets: 395 10*3/uL (ref 150–400)
RBC: 5 MIL/uL (ref 3.87–5.11)
RDW: 19 % — ABNORMAL HIGH (ref 11.5–15.5)
WBC: 17.8 10*3/uL — ABNORMAL HIGH (ref 4.0–10.5)

## 2017-05-27 LAB — LIPASE, BLOOD: Lipase: 94 U/L — ABNORMAL HIGH (ref 11–51)

## 2017-05-27 MED ORDER — LACTATED RINGERS IV BOLUS (SEPSIS)
1000.0000 mL | Freq: Once | INTRAVENOUS | Status: AC
  Administered 2017-05-27: 1000 mL via INTRAVENOUS

## 2017-05-27 MED ORDER — ONDANSETRON 4 MG PO TBDP
4.0000 mg | ORAL_TABLET | Freq: Once | ORAL | Status: AC | PRN
  Administered 2017-05-27: 4 mg via ORAL
  Filled 2017-05-27: qty 1

## 2017-05-27 NOTE — ED Triage Notes (Signed)
Pt is alert and oriented x 4 Pt is c/o generalized weakness, and nausea. Pt states that she feels that she is dehydrated and was getting at home hydration from [facility] and PICC was removed 1 week ago. Pt has recently had bladder reconstruction 04/01/17 , and has ileostomy with wound drain in left lower extremity. Pt does report weight 1 week ago was 215 lbs and reports that this morning her weight was 188lbs.

## 2017-05-27 NOTE — ED Notes (Signed)
Patient comes due to her having multiple stools in ostomy. Patient is requesting for a pic line to be placed. MD notified.

## 2017-05-28 DIAGNOSIS — Z933 Colostomy status: Secondary | ICD-10-CM | POA: Diagnosis not present

## 2017-05-28 MED ORDER — PROMETHAZINE HCL 25 MG PO TABS: 25.0000 mg | ORAL_TABLET | Freq: Four times a day (QID) | ORAL | 0 refills | Status: DC | PRN

## 2017-05-28 MED ORDER — PROCHLORPERAZINE MALEATE 10 MG PO TABS: 10.0000 mg | ORAL_TABLET | Freq: Two times a day (BID) | ORAL | 0 refills | Status: DC | PRN

## 2017-05-30 ENCOUNTER — Encounter: Payer: Self-pay | Admitting: Hematology and Oncology

## 2017-05-30 ENCOUNTER — Ambulatory Visit (HOSPITAL_COMMUNITY)
Admission: RE | Admit: 2017-05-30 | Discharge: 2017-05-30 | Disposition: A | Discharge status: Home / Self Care | Payer: BLUE CROSS/BLUE SHIELD | Source: Ambulatory Visit | Attending: Emergency Medicine | Admitting: Emergency Medicine

## 2017-05-30 ENCOUNTER — Other Ambulatory Visit (HOSPITAL_COMMUNITY): Payer: Self-pay | Admitting: Emergency Medicine

## 2017-05-30 ENCOUNTER — Encounter (HOSPITAL_COMMUNITY): Payer: Self-pay | Admitting: Interventional Radiology

## 2017-05-30 DIAGNOSIS — E878 Other disorders of electrolyte and fluid balance, not elsewhere classified: Secondary | ICD-10-CM

## 2017-05-30 DIAGNOSIS — E559 Vitamin D deficiency, unspecified: Secondary | ICD-10-CM | POA: Diagnosis not present

## 2017-05-30 DIAGNOSIS — R8271 Bacteriuria: Secondary | ICD-10-CM | POA: Diagnosis not present

## 2017-05-30 DIAGNOSIS — Z933 Colostomy status: Secondary | ICD-10-CM | POA: Diagnosis not present

## 2017-05-30 DIAGNOSIS — E039 Hypothyroidism, unspecified: Secondary | ICD-10-CM | POA: Diagnosis not present

## 2017-05-30 DIAGNOSIS — E782 Mixed hyperlipidemia: Secondary | ICD-10-CM | POA: Diagnosis not present

## 2017-05-30 DIAGNOSIS — N390 Urinary tract infection, site not specified: Secondary | ICD-10-CM | POA: Diagnosis not present

## 2017-05-30 DIAGNOSIS — E860 Dehydration: Secondary | ICD-10-CM | POA: Diagnosis not present

## 2017-05-30 DIAGNOSIS — E119 Type 2 diabetes mellitus without complications: Secondary | ICD-10-CM | POA: Diagnosis not present

## 2017-05-30 DIAGNOSIS — Z452 Encounter for adjustment and management of vascular access device: Secondary | ICD-10-CM | POA: Diagnosis not present

## 2017-05-30 HISTORY — PX: IR US GUIDE VASC ACCESS RIGHT: IMG2390

## 2017-05-30 HISTORY — PX: IR FLUORO GUIDE CV MIDLINE PICC RIGHT: IMG5212

## 2017-05-30 MED ORDER — LIDOCAINE HCL 1 % IJ SOLN
INTRAMUSCULAR | Status: AC
  Filled 2017-05-30: qty 20

## 2017-05-30 MED ORDER — HEPARIN SOD (PORK) LOCK FLUSH 100 UNIT/ML IV SOLN
INTRAVENOUS | Status: AC
  Filled 2017-05-30: qty 5

## 2017-05-30 MED ORDER — HEPARIN SOD (PORK) LOCK FLUSH 100 UNIT/ML IV SOLN
INTRAVENOUS | Status: DC | PRN
  Administered 2017-05-30: 400 [IU] via INTRAVENOUS

## 2017-05-30 MED ORDER — LIDOCAINE HCL 1 % IJ SOLN
INTRAMUSCULAR | Status: DC | PRN
  Administered 2017-05-30: 5 mL

## 2017-05-30 NOTE — Procedures (Signed)
Interventional Radiology Procedure Note  Procedure: US guided right brachial vein PICC.  38cm.  DL, power injectable. .  Complications: None Recommendations:  - Ok to shower tomorrow - Do not submerge   - Routine line care   Signed,  Dulcy Fanny. Earleen Newport, DO

## 2017-05-30 NOTE — Progress Notes (Signed)
Received PA approval from [employer] for Neupogen.  PA approved 04/30/17-06/29/17.

## 2017-05-31 ENCOUNTER — Other Ambulatory Visit (HOSPITAL_COMMUNITY): Payer: Self-pay | Admitting: Interventional Radiology

## 2017-05-31 ENCOUNTER — Ambulatory Visit (HOSPITAL_COMMUNITY)
Admission: RE | Admit: 2017-05-31 | Discharge: 2017-05-31 | Disposition: A | Discharge status: Home / Self Care | Payer: BLUE CROSS/BLUE SHIELD | Source: Ambulatory Visit | Attending: Interventional Radiology | Admitting: Interventional Radiology

## 2017-05-31 ENCOUNTER — Other Ambulatory Visit: Payer: Self-pay | Admitting: Urology

## 2017-05-31 ENCOUNTER — Encounter: Payer: Self-pay | Admitting: Hematology and Oncology

## 2017-05-31 ENCOUNTER — Encounter (HOSPITAL_COMMUNITY): Payer: Self-pay | Admitting: *Deleted

## 2017-05-31 DIAGNOSIS — E860 Dehydration: Secondary | ICD-10-CM | POA: Diagnosis not present

## 2017-05-31 DIAGNOSIS — T82898A Other specified complication of vascular prosthetic devices, implants and grafts, initial encounter: Secondary | ICD-10-CM | POA: Diagnosis not present

## 2017-05-31 DIAGNOSIS — Z452 Encounter for adjustment and management of vascular access device: Secondary | ICD-10-CM | POA: Diagnosis not present

## 2017-05-31 DIAGNOSIS — I878 Other specified disorders of veins: Secondary | ICD-10-CM | POA: Insufficient documentation

## 2017-05-31 HISTORY — PX: IR CV LINE INJECTION: IMG2294

## 2017-05-31 HISTORY — PX: IR US GUIDE VASC ACCESS LEFT: IMG2389

## 2017-05-31 HISTORY — PX: IR FLUORO GUIDE CV LINE LEFT: IMG2282

## 2017-05-31 LAB — BLOOD GAS, ARTERIAL
ACID-BASE DEFICIT: 12 mmol/L — AB (ref 0.0–2.0)
Bicarbonate: 12.6 mmol/L — ABNORMAL LOW (ref 20.0–28.0)
FIO2: 21
O2 SAT: 98.6 %
PATIENT TEMPERATURE: 98.6
PCO2 ART: 24 mmHg — AB (ref 32.0–48.0)
pH, Arterial: 7.34 — ABNORMAL LOW (ref 7.350–7.450)
pO2, Arterial: 132 mmHg — ABNORMAL HIGH (ref 83.0–108.0)

## 2017-05-31 MED ORDER — LIDOCAINE HCL (PF) 1 % IJ SOLN
INTRAMUSCULAR | Status: DC | PRN
  Administered 2017-05-31: 5 mL

## 2017-05-31 MED ORDER — GENTAMICIN SULFATE 40 MG/ML IJ SOLN
5.0000 mg/kg | INTRAMUSCULAR | Status: AC
  Administered 2017-06-01: 320 mg via INTRAVENOUS
  Filled 2017-05-31: qty 8

## 2017-05-31 MED ORDER — IOPAMIDOL (ISOVUE-300) INJECTION 61%
INTRAVENOUS | Status: AC
  Administered 2017-05-31: 20 mL
  Filled 2017-05-31: qty 50

## 2017-05-31 MED ORDER — HEPARIN SOD (PORK) LOCK FLUSH 100 UNIT/ML IV SOLN
INTRAVENOUS | Status: AC
  Administered 2017-05-31: 500 [IU]
  Filled 2017-05-31: qty 5

## 2017-05-31 MED ORDER — LIDOCAINE HCL 1 % IJ SOLN
INTRAMUSCULAR | Status: AC
  Filled 2017-05-31: qty 20

## 2017-05-31 NOTE — Procedures (Signed)
Poor iv access, pre op, chronic dehydration  S/p LUE DL POWER PICC  TIP SVCRA NO COMP STABLE READY FOR USE   RUE PICC CONFIRMED TO BE ARTERIAL AND WAS REMOVED W/O COMPLICATION

## 2017-05-31 NOTE — Discharge Instructions (Signed)
CALL [phone] IF ANY PROBLEMS,QUESTIONS, OR CONCERNS; CALL IF ANY REDNESS,DRAINAGE,FEVER,PAIN,SWELLING, OR BLEEDING FROM SITE WHERE PICC LINE WAS REMOVED OR WHERE NEW PICC LINE WAS PLACED

## 2017-05-31 NOTE — Progress Notes (Signed)
Orthopedic Tech Progress Note Patient Details:  [name] 07-29-1956 [id number]  Ortho Devices Type of Ortho Device: Arm sling Ortho Device/Splint Interventions: Criss Alvine 05/31/2017, 4:20 PM

## 2017-06-01 ENCOUNTER — Ambulatory Visit (HOSPITAL_COMMUNITY): Payer: BLUE CROSS/BLUE SHIELD | Admitting: Anesthesiology

## 2017-06-01 ENCOUNTER — Encounter (HOSPITAL_COMMUNITY): Payer: Self-pay | Admitting: Interventional Radiology

## 2017-06-01 ENCOUNTER — Ambulatory Visit (HOSPITAL_COMMUNITY): Payer: BLUE CROSS/BLUE SHIELD

## 2017-06-01 ENCOUNTER — Encounter (HOSPITAL_COMMUNITY)
Admission: RE | Disposition: A | Discharge status: Home / Self Care | Payer: Self-pay | Source: Ambulatory Visit | Attending: Urology

## 2017-06-01 ENCOUNTER — Ambulatory Visit (HOSPITAL_COMMUNITY)
Admission: RE | Admit: 2017-06-01 | Discharge: 2017-06-01 | Disposition: A | Discharge status: Home / Self Care | Payer: BLUE CROSS/BLUE SHIELD | Source: Ambulatory Visit | Attending: Urology | Admitting: Urology

## 2017-06-01 DIAGNOSIS — Z853 Personal history of malignant neoplasm of breast: Secondary | ICD-10-CM | POA: Insufficient documentation

## 2017-06-01 DIAGNOSIS — E039 Hypothyroidism, unspecified: Secondary | ICD-10-CM | POA: Diagnosis not present

## 2017-06-01 DIAGNOSIS — Z885 Allergy status to narcotic agent status: Secondary | ICD-10-CM | POA: Insufficient documentation

## 2017-06-01 DIAGNOSIS — I10 Essential (primary) hypertension: Secondary | ICD-10-CM | POA: Diagnosis not present

## 2017-06-01 DIAGNOSIS — E11319 Type 2 diabetes mellitus with unspecified diabetic retinopathy without macular edema: Secondary | ICD-10-CM | POA: Diagnosis not present

## 2017-06-01 DIAGNOSIS — K449 Diaphragmatic hernia without obstruction or gangrene: Secondary | ICD-10-CM | POA: Diagnosis not present

## 2017-06-01 DIAGNOSIS — E1151 Type 2 diabetes mellitus with diabetic peripheral angiopathy without gangrene: Secondary | ICD-10-CM | POA: Diagnosis not present

## 2017-06-01 DIAGNOSIS — Z807 Family history of other malignant neoplasms of lymphoid, hematopoietic and related tissues: Secondary | ICD-10-CM | POA: Insufficient documentation

## 2017-06-01 DIAGNOSIS — N9989 Other postprocedural complications and disorders of genitourinary system: Secondary | ICD-10-CM | POA: Insufficient documentation

## 2017-06-01 DIAGNOSIS — Z800 Family history of malignant neoplasm of digestive organs: Secondary | ICD-10-CM | POA: Diagnosis not present

## 2017-06-01 DIAGNOSIS — Z882 Allergy status to sulfonamides status: Secondary | ICD-10-CM | POA: Insufficient documentation

## 2017-06-01 DIAGNOSIS — Z923 Personal history of irradiation: Secondary | ICD-10-CM | POA: Insufficient documentation

## 2017-06-01 DIAGNOSIS — Z8543 Personal history of malignant neoplasm of ovary: Secondary | ICD-10-CM | POA: Insufficient documentation

## 2017-06-01 DIAGNOSIS — Z8249 Family history of ischemic heart disease and other diseases of the circulatory system: Secondary | ICD-10-CM | POA: Insufficient documentation

## 2017-06-01 DIAGNOSIS — N135 Crossing vessel and stricture of ureter without hydronephrosis: Secondary | ICD-10-CM | POA: Diagnosis not present

## 2017-06-01 DIAGNOSIS — I739 Peripheral vascular disease, unspecified: Secondary | ICD-10-CM | POA: Insufficient documentation

## 2017-06-01 DIAGNOSIS — Z833 Family history of diabetes mellitus: Secondary | ICD-10-CM | POA: Insufficient documentation

## 2017-06-01 DIAGNOSIS — Z803 Family history of malignant neoplasm of breast: Secondary | ICD-10-CM | POA: Insufficient documentation

## 2017-06-01 DIAGNOSIS — R3981 Functional urinary incontinence: Secondary | ICD-10-CM | POA: Diagnosis not present

## 2017-06-01 DIAGNOSIS — Z9049 Acquired absence of other specified parts of digestive tract: Secondary | ICD-10-CM | POA: Insufficient documentation

## 2017-06-01 DIAGNOSIS — K219 Gastro-esophageal reflux disease without esophagitis: Secondary | ICD-10-CM | POA: Insufficient documentation

## 2017-06-01 DIAGNOSIS — Z8719 Personal history of other diseases of the digestive system: Secondary | ICD-10-CM | POA: Diagnosis not present

## 2017-06-01 DIAGNOSIS — Z6834 Body mass index (BMI) 34.0-34.9, adult: Secondary | ICD-10-CM | POA: Insufficient documentation

## 2017-06-01 DIAGNOSIS — Z8542 Personal history of malignant neoplasm of other parts of uterus: Secondary | ICD-10-CM | POA: Diagnosis not present

## 2017-06-01 DIAGNOSIS — Z90710 Acquired absence of both cervix and uterus: Secondary | ICD-10-CM | POA: Diagnosis not present

## 2017-06-01 DIAGNOSIS — D708 Other neutropenia: Secondary | ICD-10-CM | POA: Insufficient documentation

## 2017-06-01 DIAGNOSIS — E782 Mixed hyperlipidemia: Secondary | ICD-10-CM | POA: Diagnosis not present

## 2017-06-01 DIAGNOSIS — Z880 Allergy status to penicillin: Secondary | ICD-10-CM | POA: Diagnosis not present

## 2017-06-01 DIAGNOSIS — Z808 Family history of malignant neoplasm of other organs or systems: Secondary | ICD-10-CM | POA: Insufficient documentation

## 2017-06-01 DIAGNOSIS — Z888 Allergy status to other drugs, medicaments and biological substances status: Secondary | ICD-10-CM | POA: Insufficient documentation

## 2017-06-01 DIAGNOSIS — E559 Vitamin D deficiency, unspecified: Secondary | ICD-10-CM | POA: Insufficient documentation

## 2017-06-01 DIAGNOSIS — Z881 Allergy status to other antibiotic agents status: Secondary | ICD-10-CM | POA: Insufficient documentation

## 2017-06-01 DIAGNOSIS — Z419 Encounter for procedure for purposes other than remedying health state, unspecified: Secondary | ICD-10-CM

## 2017-06-01 HISTORY — PX: CYSTOSCOPY W/ URETERAL STENT PLACEMENT: SHX1429

## 2017-06-01 HISTORY — PX: CYSTOGRAM: SHX6285

## 2017-06-01 LAB — GLUCOSE, CAPILLARY
GLUCOSE-CAPILLARY: 143 mg/dL — AB (ref 65–99)
Glucose-Capillary: 153 mg/dL — ABNORMAL HIGH (ref 65–99)

## 2017-06-01 SURGERY — CYSTOSCOPY, WITH RETROGRADE PYELOGRAM AND URETERAL STENT INSERTION
Anesthesia: General

## 2017-06-01 MED ORDER — 0.9 % SODIUM CHLORIDE (POUR BTL) OPTIME
TOPICAL | Status: DC | PRN
  Administered 2017-06-01: 50 mL

## 2017-06-01 MED ORDER — DIATRIZOATE MEGLUMINE 30 % UR SOLN
URETHRAL | Status: AC
  Filled 2017-06-01: qty 100

## 2017-06-01 MED ORDER — PROPOFOL 10 MG/ML IV BOLUS
INTRAVENOUS | Status: DC | PRN
  Administered 2017-06-01: 150 mg via INTRAVENOUS

## 2017-06-01 MED ORDER — FENTANYL CITRATE (PF) 100 MCG/2ML IJ SOLN
INTRAMUSCULAR | Status: AC
  Filled 2017-06-01: qty 2

## 2017-06-01 MED ORDER — HYDROMORPHONE HCL 2 MG PO TABS: 2.0000 mg | ORAL_TABLET | ORAL | 0 refills | Status: DC | PRN

## 2017-06-01 MED ORDER — LIDOCAINE 2% (20 MG/ML) 5 ML SYRINGE
INTRAMUSCULAR | Status: AC
  Filled 2017-06-01: qty 5

## 2017-06-01 MED ORDER — LIDOCAINE 2% (20 MG/ML) 5 ML SYRINGE
INTRAMUSCULAR | Status: DC | PRN
  Administered 2017-06-01: 80 mg via INTRAVENOUS

## 2017-06-01 MED ORDER — DEXAMETHASONE SODIUM PHOSPHATE 10 MG/ML IJ SOLN
INTRAMUSCULAR | Status: DC | PRN
  Administered 2017-06-01: 10 mg via INTRAVENOUS

## 2017-06-01 MED ORDER — FENTANYL CITRATE (PF) 100 MCG/2ML IJ SOLN: 25.0000 ug | INTRAMUSCULAR | Status: DC | PRN

## 2017-06-01 MED ORDER — SODIUM CHLORIDE 0.9 % IR SOLN
Status: DC | PRN
  Administered 2017-06-01: 3000 mL via INTRAVESICAL

## 2017-06-01 MED ORDER — PHENYLEPHRINE 40 MCG/ML (10ML) SYRINGE FOR IV PUSH (FOR BLOOD PRESSURE SUPPORT)
PREFILLED_SYRINGE | INTRAVENOUS | Status: AC
  Filled 2017-06-01: qty 10

## 2017-06-01 MED ORDER — ONDANSETRON HCL 4 MG/2ML IJ SOLN
INTRAMUSCULAR | Status: AC
  Filled 2017-06-01: qty 2

## 2017-06-01 MED ORDER — MIDAZOLAM HCL 2 MG/2ML IJ SOLN
INTRAMUSCULAR | Status: AC
  Filled 2017-06-01: qty 2

## 2017-06-01 MED ORDER — EPHEDRINE SULFATE-NACL 50-0.9 MG/10ML-% IV SOSY
PREFILLED_SYRINGE | INTRAVENOUS | Status: DC | PRN
  Administered 2017-06-01: 5 mg via INTRAVENOUS
  Administered 2017-06-01 (×2): 10 mg via INTRAVENOUS
  Administered 2017-06-01: 5 mg via INTRAVENOUS

## 2017-06-01 MED ORDER — DEXAMETHASONE SODIUM PHOSPHATE 10 MG/ML IJ SOLN
INTRAMUSCULAR | Status: AC
  Filled 2017-06-01: qty 1

## 2017-06-01 MED ORDER — PHENYLEPHRINE 40 MCG/ML (10ML) SYRINGE FOR IV PUSH (FOR BLOOD PRESSURE SUPPORT)
PREFILLED_SYRINGE | INTRAVENOUS | Status: DC | PRN
  Administered 2017-06-01: 80 ug via INTRAVENOUS

## 2017-06-01 MED ORDER — LACTATED RINGERS IV SOLN
INTRAVENOUS | Status: DC | PRN
  Administered 2017-06-01: 11:00:00 via INTRAVENOUS

## 2017-06-01 MED ORDER — MIDAZOLAM HCL 5 MG/5ML IJ SOLN
INTRAMUSCULAR | Status: DC | PRN
  Administered 2017-06-01: 2 mg via INTRAVENOUS

## 2017-06-01 MED ORDER — SCOPOLAMINE 1 MG/3DAYS TD PT72
MEDICATED_PATCH | TRANSDERMAL | Status: DC | PRN
  Administered 2017-06-01: 1 via TRANSDERMAL

## 2017-06-01 MED ORDER — ONDANSETRON HCL 4 MG/2ML IJ SOLN
INTRAMUSCULAR | Status: DC | PRN
  Administered 2017-06-01: 4 mg via INTRAVENOUS

## 2017-06-01 MED ORDER — ONDANSETRON HCL 4 MG/2ML IJ SOLN: 4.0000 mg | Freq: Four times a day (QID) | INTRAMUSCULAR | Status: DC | PRN

## 2017-06-01 MED ORDER — DIATRIZOATE MEGLUMINE 30 % UR SOLN
URETHRAL | Status: DC | PRN
  Administered 2017-06-01: 100 mL via URETHRAL

## 2017-06-01 MED ORDER — SCOPOLAMINE 1 MG/3DAYS TD PT72
MEDICATED_PATCH | TRANSDERMAL | Status: AC
  Filled 2017-06-01: qty 1

## 2017-06-01 MED ORDER — FENTANYL CITRATE (PF) 100 MCG/2ML IJ SOLN
INTRAMUSCULAR | Status: DC | PRN
  Administered 2017-06-01: 50 ug via INTRAVENOUS

## 2017-06-01 MED ORDER — EPHEDRINE 5 MG/ML INJ
INTRAVENOUS | Status: AC
  Filled 2017-06-01: qty 10

## 2017-06-01 MED ORDER — PROPOFOL 10 MG/ML IV BOLUS
INTRAVENOUS | Status: AC
  Filled 2017-06-01: qty 20

## 2017-06-01 SURGICAL SUPPLY — 20 items
BAG URINE LEG 500ML (DRAIN) ×2 IMPLANT
BAG URO CATCHER STRL LF (MISCELLANEOUS) ×2 IMPLANT
BASKET ZERO TIP NITINOL 2.4FR (BASKET) IMPLANT
CATH FOLEY 2WAY SLVR  5CC 18FR (CATHETERS) ×1
CATH FOLEY 2WAY SLVR 5CC 18FR (CATHETERS) ×1 IMPLANT
CATH INTERMIT  6FR 70CM (CATHETERS) ×2 IMPLANT
CLOTH BEACON ORANGE TIMEOUT ST (SAFETY) ×2 IMPLANT
COVER FOOTSWITCH UNIV (MISCELLANEOUS) ×2 IMPLANT
COVER SURGICAL LIGHT HANDLE (MISCELLANEOUS) IMPLANT
GLOVE BIOGEL M STRL SZ7.5 (GLOVE) ×4 IMPLANT
GOWN STRL REUS W/TWL LRG LVL3 (GOWN DISPOSABLE) ×4 IMPLANT
GUIDEWIRE ANG ZIPWIRE 038X150 (WIRE) ×2 IMPLANT
GUIDEWIRE STR DUAL SENSOR (WIRE) IMPLANT
HOLDER FOLEY CATH W/STRAP (MISCELLANEOUS) ×2 IMPLANT
MANIFOLD NEPTUNE II (INSTRUMENTS) ×2 IMPLANT
NS IRRIG 1000ML POUR BTL (IV SOLUTION) ×2 IMPLANT
PACK CYSTO (CUSTOM PROCEDURE TRAY) ×2 IMPLANT
SYR 10ML LL (SYRINGE) ×2 IMPLANT
TUBING CONNECTING 10 (TUBING) ×2 IMPLANT
WATER STERILE IRR 500ML POUR (IV SOLUTION) ×2 IMPLANT

## 2017-06-01 NOTE — Anesthesia Postprocedure Evaluation (Signed)
Anesthesia Post Note  Patient: Financial planner  Procedure(s) Performed: CYSTOSCOPY WITH EXAM UNDER ANESTHESIA (N/A ) CYSTOGRAM (N/A )     Patient location during evaluation: PACU Anesthesia Type: General Level of consciousness: awake and alert Pain management: pain level controlled Vital Signs Assessment: post-procedure vital signs reviewed and stable Respiratory status: spontaneous breathing, nonlabored ventilation, respiratory function stable and patient connected to nasal cannula oxygen Cardiovascular status: blood pressure returned to baseline and stable Postop Assessment: no apparent nausea or vomiting Anesthetic complications: no    Last Vitals:  Vitals:   06/01/17 1330 06/01/17 1422  BP: 100/64 (!) 107/59  Pulse: 77 85  Resp: 17 18  Temp: (!) 36.3 C (!) 36.3 C  SpO2: 100% 100%    Last Pain:  Vitals:   06/01/17 1422  TempSrc: Oral  PainSc: 0-No pain                 Kamiyah Kindel S

## 2017-06-01 NOTE — Anesthesia Procedure Notes (Signed)
Procedure Name: LMA Insertion Date/Time: 06/01/2017 11:43 AM Performed by: Carleene Cooper A Pre-anesthesia Checklist: Patient identified, Emergency Drugs available, Suction available, Patient being monitored and Timeout performed Patient Re-evaluated:Patient Re-evaluated prior to induction Oxygen Delivery Method: Circle system utilized Preoxygenation: Pre-oxygenation with 100% oxygen Induction Type: IV induction Ventilation: Mask ventilation without difficulty LMA: LMA with gastric port inserted LMA Size: 4.0 Number of attempts: 1 Placement Confirmation: positive ETCO2 and breath sounds checked- equal and bilateral Tube secured with: Tape Dental Injury: Teeth and Oropharynx as per pre-operative assessment

## 2017-06-01 NOTE — Anesthesia Preprocedure Evaluation (Signed)
Anesthesia Evaluation  Patient identified by MRN, date of birth, ID band Patient awake    Reviewed: Allergy & Precautions, H&P , NPO status , Patient's Chart, lab work & pertinent test results  History of Anesthesia Complications (+) PONV and history of anesthetic complications  Airway Mallampati: II   Neck ROM: full    Dental   Pulmonary    breath sounds clear to auscultation       Cardiovascular hypertension, + Peripheral Vascular Disease   Rhythm:regular Rate:Normal     Neuro/Psych  Neuromuscular disease    GI/Hepatic hiatal hernia, GERD  ,  Endo/Other  diabetes, Type 2Hypothyroidism obese  Renal/GU Ureteral stricture     Musculoskeletal   Abdominal   Peds  Hematology   Anesthesia Other Findings   Reproductive/Obstetrics                             Anesthesia Physical Anesthesia Plan  ASA: III  Anesthesia Plan: General   Post-op Pain Management:    Induction: Intravenous  PONV Risk Score and Plan: 4 or greater and Ondansetron, Dexamethasone, Midazolam, Scopolamine patch - Pre-op and Treatment may vary due to age or medical condition  Airway Management Planned: LMA  Additional Equipment:   Intra-op Plan:   Post-operative Plan:   Informed Consent: I have reviewed the patients History and Physical, chart, labs and discussed the procedure including the risks, benefits and alternatives for the proposed anesthesia with the patient or authorized representative who has indicated his/her understanding and acceptance.     Plan Discussed with: CRNA, Anesthesiologist and Surgeon  Anesthesia Plan Comments:         Anesthesia Quick Evaluation

## 2017-06-01 NOTE — Brief Op Note (Signed)
06/01/2017  12:21 PM  PATIENT:  [name]  61 y.o. female  PRE-OPERATIVE DIAGNOSIS:  URETERAL STRICTURE, Bladder leak  POST-OPERATIVE DIAGNOSIS:  URETERAL STRICTURE, Bladder leak  PROCEDURE:  Procedure(s): CYSTOSCOPY WITH RETROGRADE PYELOGRAM/URETERAL POSSIBLE STENT REPLACEMENT (Right) CYSTOGRAM (N/A)  SURGEON:  Surgeon(s) and Role:    Alexis Frock, MD - Primary  PHYSICIAN ASSISTANT:   ASSISTANTS: none   ANESTHESIA:   general  EBL:  none  BLOOD ADMINISTERED:none  DRAINS: 1 - foley to gravity; 2 - Urinoma drain to gravity   LOCAL MEDICATIONS USED:  NONE  SPECIMEN:  No Specimen  DISPOSITION OF SPECIMEN:  N/A  COUNTS:  YES  TOURNIQUET:  * No tourniquets in log *  DICTATION: .Other Dictation: Dictation Number [id number]  PLAN OF CARE: Discharge to home after PACU  PATIENT DISPOSITION:  PACU - hemodynamically stable.   Delay start of Pharmacological VTE agent (>24hrs) due to surgical blood loss or risk of bleeding: yes

## 2017-06-01 NOTE — H&P (Signed)
[name] is an 61 y.o. female.    Chief Complaint: Pre-OP Cystoscopy, Cystogram, Possible Rt Retrograde / stent exchange  HPI:   1 - Bladder Injury / Right Ureteral Stricture - s/p robotic RIGHT ureteral reimplant with psoas hitch + HM Flap 04/01/17 at time of colostomy take down / adhesiolysis, small bowel resection, loop iliostomy for right distal stricture / bladder injury sustained at pelvic resection for advanced endometrial cancer 2016. Drain removed 9/6 as Cr same as serum and output scant.   Recent Post-op Course:  10/1 - cystogram normal, urinoma drain output <46m per day ==> Removed foley, but then JP output jumped to >400 per day therefore foley replaced 10/2. Drain study shows likely small anterior bladder leak. Cr 0.9.  10/20 - Drain output nil, repeat trial of foley out, failed and replaced.   2 - Metastatic Endometrial Cancer - s/p repeat resection [id number] colon and vaginal involvement but negative nodes / margins. Had adjuvant chemo-XRT which she is now done with. Follows with Dr. GAlvy Bimler    PMH sig for DM2, morbid obesity, breast cancer, ovarian cancer, endometrial cancer.    Today "[name] is seen to proceed with operative cystoscopy / exam under anesthesia / cystogram with goal of ruling out foreign body in bladder or other modifiable etiology leading to prolonged bladder leak / urinoma. She continues to struggle with dehydration from iliostomy and had PICC replaced yesterday so she can receive PRN fluids. No interval fevers.    Past Medical History:  Diagnosis Date  . Acute bacterial bronchitis 06/04/2015  . Anemia in neoplastic disease   . Benign essential HTN   . Breast cancer of upper-inner quadrant of left female breast ([place]   . 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  Cancer of corpus uteri, except isthmus ([facility] dx 10-15-2004 oncologist-- dr rDenman Georgeand dr gAlvy Bimler    dx endometrial and ovarian cancer s/p  chemotheapy and surgery:  recurrent 11-19-2014 w/ radiation 01-29-2015 to 03-10-2015  . Cellulitis of left abdominal wall near colostomy 12/03/2014  . Chronic idiopathic neutropenia (HCC)    presumed related to chemotherapy March 2006--- followed by dr gAlvy Bimler  . Complication of anesthesia    PONV  . Diabetic retinopathy, background ([place]   . DM type 2 (diabetes mellitus, type 2) ([place]    monitored by dr mLegrand Comoaltheimer  . Dysuria   . GERD (gastroesophageal reflux disease)    takes omeprazole  . Hiatal hernia   . History of bronchitis   . History of gastric polyp    2014  duodenum  . History of radiation therapy    01-29-2015 to 03-10-2015  pelvis 50.4Gy  . Hypothyroidism    monitored by dr mLegrand Comoaltheimer  . Malignant neoplasm of corpus uteri, except isthmus ([place] 05/10/2012  . Malignant neoplasm of ovary ([place] 05/10/2012  . Mixed dyslipidemia   . Multiple thyroid nodules    Managed by Dr. GHarlow Asa . PONV (postoperative nausea and vomiting)    "scopolamine patch works for me"  . Radiation-induced dermatitis    contact dermatitis , radiation completed, rash only on ankles now.  . Reactive thrombocytosis 11/29/2014  . Right flank pain   . S/P colostomy ([place]   . Seasonal allergies   . Shoulder stiffness    bilateral   .  Ureteral stricture, right UROLOGIT-  DR [facility]   CHRONIC--  TREATMENT URETERAL STENT  . Vitamin D deficiency   . Wears glasses     Past Surgical History:  Procedure Laterality Date  . APPENDECTOMY    . BREAST RECONSTRUCTION WITH PLACEMENT OF TISSUE EXPANDER AND FLEX HD (ACELLULAR HYDRATED DERMIS) Bilateral 12/11/2015   Procedure: BILATERAL BREAST RECONSTRUCTION WITH PLACEMENT OF TISSUE EXPANDERS;  Surgeon: Irene Limbo, MD;  Location: [place];  Service: Plastics;  Laterality: Bilateral;  . COLONOSCOPY WITH PROPOFOL N/A  08/21/2013   Procedure: COLONOSCOPY WITH PROPOFOL;  Surgeon: Cleotis Nipper, MD;  [facility];  Service: Endoscopy;  Laterality: N/A;  . COLOSTOMY TAKEDOWN N/A 12/04/2014   Procedure: LAPROSCOPIC LYSIS OF ADHESIONS, SPLENIC MOBILIZATION, RELOCATION OF COLOSTOMY, DEBRIDEMENT INITIAL COLOSTOMY SITE;  Surgeon: Michael [name], MD;  [facility];  Service: General;  Laterality: N/A;  . CYSTOSCOPY W/ RETROGRADES Right 11/21/2015   Procedure: CYSTOSCOPY WITH RETROGRADE PYELOGRAM;  Surgeon: Alexis Frock, MD;  [facility];  Service: Urology;  Laterality: Right;  . CYSTOSCOPY W/ URETERAL STENT PLACEMENT Right 11/21/2015   Procedure: CYSTOSCOPY WITH STENT REPLACEMENT;  Surgeon: Alexis Frock, MD;  [facility];  Service: Urology;  Laterality: Right;  . CYSTOSCOPY W/ URETERAL STENT PLACEMENT Right 03/10/2016   Procedure: CYSTOSCOPY WITH STENT REPLACEMENT;  Surgeon: Alexis Frock, MD;  Location[facility];  Service: Urology;  Laterality: Right;  . CYSTOSCOPY W/ URETERAL STENT PLACEMENT Right 06/30/2016   Procedure: CYSTOSCOPY WITH RETROGRADE PYELOGRAM/URETERAL STENT EXCHANGE;  Surgeon: Alexis Frock, MD;  Location: [facility];  Service: Urology;  Laterality: Right;  . CYSTOSCOPY WITH RETROGRADE PYELOGRAM, URETEROSCOPY AND STENT PLACEMENT Right 03/20/2015   Procedure: CYSTOSCOPY WITH RETROGRADE PYELOGRAM, URETEROSCOPY WITH BALLOON DILATION AND STENT PLACEMENT ON RIGHT;  Surgeon: Alexis Frock, MD;  Location: [facility];  Service: Urology;  Laterality: Right;  . CYSTOSCOPY WITH RETROGRADE PYELOGRAM, URETEROSCOPY AND STENT PLACEMENT Right 05/02/2015   Procedure: CYSTOSCOPY WITH RIGHT RETROGRADE PYELOGRAM,  DIAGNOSTIC URETEROSCOPY AND STENT PULL ;  Surgeon: Alexis Frock, MD;  Location: [facility];  Service: Urology;  Laterality: Right;  . CYSTOSCOPY WITH RETROGRADE PYELOGRAM, URETEROSCOPY AND STENT PLACEMENT Right 09/05/2015    Procedure: CYSTOSCOPY WITH RETROGRADE PYELOGRAM,  AND STENT PLACEMENT;  Surgeon: Alexis Frock, MD;  [facility];  Service: Urology;  Laterality: Right;  . CYSTOSCOPY WITH RETROGRADE PYELOGRAM, URETEROSCOPY AND STENT PLACEMENT Right 04/01/2017   Procedure: CYSTOSCOPY WITH RETROGRADE PYELOGRAM, URETEROSCOPY AND STENT PLACEMENT;  Surgeon: Alexis Frock, MD;  [facility];  Service: Urology;  Laterality: Right;  . CYSTOSCOPY WITH STENT PLACEMENT Right 10/27/2016   Procedure: CYSTOSCOPY WITH STENT CHANGE and right retrograde pyelogram;  Surgeon: Alexis Frock, MD;  Location: [facility];  Service: Urology;  Laterality: Right;  . EUS N/A 10/02/2014   Procedure: LOWER ENDOSCOPIC ULTRASOUND (EUS);  Surgeon: Arta Silence, MD;  [facility];  Service: Endoscopy;  Laterality: N/A;  . EXCISION SOFT TISSUE MASS RIGHT FOREMAN  12-08-2006  . EYE SURGERY  as child   pytosis of eyelids repair  . INCISION AND DRAINAGE OF WOUND Bilateral 12/26/2015   Procedure: DEBRIDEMENT OF BILATERAL MASTECTOMY FLAPS;  Surgeon: Irene Limbo, MD;  Location: [place];  Service: Plastics;  Laterality: Bilateral;  . IR FLUORO GUIDE CV LINE RIGHT  04/06/2017  . IR FLUORO GUIDE CV MIDLINE PICC RIGHT  05/30/2017  . IR US GUIDE VASC ACCESS RIGHT  04/06/2017  . IR US GUIDE  VASC ACCESS RIGHT  05/30/2017  . LAPAROSCOPIC CHOLECYSTECTOMY  1990  . LIPOSUCTION WITH LIPOFILLING Bilateral 04/16/2016   Procedure: LIPOSUCTION WITH LIPOFILLING TO BILATERAL CHEST;  Surgeon: Irene Limbo, MD;  Location: [place];  Service: Plastics;  Laterality: Bilateral;  . MASTECTOMY Right 12/11/2015   PROPHYLACTIC   . MASTECTOMY COMPLETE / SIMPLE W/ SENTINEL NODE BIOPSY Left 12/11/2015   AXILLARY SENTINEL LYMPH NODE BIOPSY   . MASTECTOMY W/ SENTINEL NODE BIOPSY Bilateral 12/11/2015   Procedure: RIGHT PROPHYLACTIC MASTECTOMY, LEFT TOTAL MASTECTOMY WITH LEFT AXILLARY SENTINEL LYMPH NODE  BIOPSY;  Surgeon: Stark Klein, MD;  Location: [place];  Service: General;  Laterality: Bilateral;  . OSTOMY N/A 11/19/2014   Procedure: OSTOMY;  Surgeon: Michael [name], MD;  [facility];  Service: General;  Laterality: N/A;  . PROCTOSCOPY N/A 04/01/2017   Procedure: RIDGE PROCTOSCOPY;  Surgeon: Michael [name], MD;  [facility];  Service: General;  Laterality: N/A;  . REMOVAL OF BILATERAL TISSUE EXPANDERS WITH PLACEMENT OF BILATERAL BREAST IMPLANTS Bilateral 04/16/2016   Procedure: REMOVAL OF BILATERAL TISSUE EXPANDERS WITH PLACEMENT OF BILATERAL BREAST IMPLANTS;  Surgeon: Irene Limbo, MD;  Location: [place];  Service: Plastics;  Laterality: Bilateral;  . ROBOTIC ASSISTED LAP VAGINAL HYSTERECTOMY N/A 11/19/2014   Procedure: ROBOTIC LYSIS OF ADHESIONS, CONVERTED TO LAPAROTOMY RADICAL UPPER VAGINECTOMY,LOW ANTERIOR BOWEL RESECTION, COLOSTOMY, BILATERAL URETERAL STENT PLACEMENT AND CYSTONOMY CLOSURE;  Surgeon: Everitt Amber, MD;  [facility];  Service: Gynecology;  Laterality: N/A;  . TISSUE EXPANDER FILLING Bilateral 12/26/2015   Procedure: EXPANSION OF BILATERAL CHEST TISSUE EXPANDERS (60 mL- Right; 75 mL- Left);  Surgeon: Irene Limbo, MD;  Location: [place];  Service: Plastics;  Laterality: Bilateral;  . TONSILLECTOMY    . TOTAL ABDOMINAL HYSTERECTOMY  March 2006   [facility]   and Bilateral Salpingoophorectomy/  staging for Ovarian cancer/  an  . XI ROBOTIC ASSISTED LOWER ANTERIOR RESECTION N/A 04/01/2017   Procedure: XI ROBOTIC VS LAPAROSCOPIC COLOSTOMY TAKEDOWN WITH LYSIS OF ADHESIONS.;  Surgeon: Michael [name], MD;  [facility];  Service: General;  Laterality: N/A;  ERAS PATHWAY    Family History  Problem Relation Age of Onset  . Cancer Mother 52       stomach ca  . Hypertension Mother   . Cancer Father 45       prostate ca  . Diabetes Father   . Heart disease Father        CABG  . Breast cancer Maternal Aunt        dx in her 6s  .  Lymphoma Paternal Aunt   . Brain cancer Paternal Grandfather   . Ovarian cancer Other   . Diabetes Sister   . Hypertension Brother y-10  . Heart disease Brother        CABG  . Diabetes Brother    Social History:  reports that she has never smoked. She has never used smokeless tobacco. She reports that she drinks alcohol. She reports that she does not use drugs.  Allergies:  Allergies  Allergen Reactions  . Penicillins Swelling    Facial swelling/childhood allergy Has patient had a PCN reaction causing immediate rash, facial/tongue/throat swelling, SOB or lightheadedness with hypotension: Yes Has patient had a PCN reaction causing severe rash involving mucus membranes or skin necrosis: Yes Has patient had a PCN reaction that required hospitalization yes Has patient had a PCN reaction occurring within the last 10 years: No If all of the above answers  are "NO", then may proceed with Cephalosporin use.   [name] Kitchen Ultram [Tramadol] Hives  . Adhesive [Tape]     blisters  . Cefaclor Rash    Ceclor  . Erythromycin     Gastritis, abd cramps  . Trimethoprim Rash  . Ciprofloxacin Other (See Comments)    Unknown On Dr notes   . Fluconazole Rash  . Oxycodone     " I just feel weird"  . Pectin Rash    Pectin ring for stoma  . Sulfa Antibiotics Rash    No prescriptions prior to admission.    Results for orders placed or performed during the hospital encounter of 05/31/17 (from the past 48 hour(s))  Blood gas, arterial     Status: Abnormal   Collection Time: 05/31/17  2:11 PM  Result Value Ref Range   FIO2 21.00    pH, Arterial 7.340 (L) 7.350 - 7.450   pCO2 arterial 24.0 (L) 32.0 - 48.0 mmHg   pO2, Arterial 132 (H) 83.0 - 108.0 mmHg   Bicarbonate 12.6 (L) 20.0 - 28.0 mmol/L   Acid-base deficit 12.0 (H) 0.0 - 2.0 mmol/L   O2 Saturation 98.6 %   Patient temperature 98.6    Collection site PICC    Sample type ARTERIAL DRAW    Allens test (pass/fail) PASS PASS   Ir US Guide Vasc  Access Right  Result Date: 05/30/2017 INDICATION: 61 year old female with a history of dehydration EXAM: PICC LINE PLACEMENT WITH ULTRASOUND AND FLUOROSCOPIC GUIDANCE MEDICATIONS: None ANESTHESIA/SEDATION: None FLUOROSCOPY TIME:  Fluoroscopy Time: 0 minutes 12 seconds (0.8 mGy). COMPLICATIONS: None PROCEDURE: Informed written consent was obtained from the patient after a thorough discussion of the procedural risks, benefits and alternatives. All questions were addressed. Maximal Sterile Barrier Technique was utilized including caps, mask, sterile gowns, sterile gloves, sterile drape, hand hygiene and skin antiseptic. A timeout was performed prior to the initiation of the procedure. Patient was position in the supine position on the fluoroscopy table with the right arm abducted 90 degrees. Ultrasound survey of the upper extremity was performed with images stored and sent to PACs. The right brachial vein was selected for access. Once the patient was prepped and draped in the usual sterile fashion, the skin and subcutaneous tissues were generously infiltrated with 1% lidocaine for local anesthesia. A micropuncture access kit was then used to access the targeted vein. Wire was passed centrally, confirmed to be within the venous system under fluoroscopy. A small stab incision was made with an 11 blade scalpel and the sheath was then placed over the wire. Estimated length of the catheter was then performed with the indwelling wire. Catheter was amputated at 38 cm length and placed with coaxial wire through the peel-away. Double-lumen, power injectable PICC in the basilic vein. Tip confirmed at the cavoatrial junction, and the catheter is ready for use. Stat lock was placed. Patient tolerated the procedure well and remained hemodynamically stable throughout. No complications were encountered and no significant blood loss was encountered. IMPRESSION: Status post right brachial double-lumen power injectable PICC. Catheter  ready for use. Signed, Dulcy Fanny. Earleen Newport, DO [facility] Radiology Electronically Signed   By: Corrie Mckusick D.O.   On: 05/30/2017 17:27   Ir Fluoro Guide Cv Midline Picc Right  Result Date: 05/30/2017 INDICATION: 61 year old female with a history of dehydration EXAM: PICC LINE PLACEMENT WITH ULTRASOUND AND FLUOROSCOPIC GUIDANCE MEDICATIONS: None ANESTHESIA/SEDATION: None FLUOROSCOPY TIME:  Fluoroscopy Time: 0 minutes 12 seconds (0.8 mGy). COMPLICATIONS:  None PROCEDURE: Informed written consent was obtained from the patient after a thorough discussion of the procedural risks, benefits and alternatives. All questions were addressed. Maximal Sterile Barrier Technique was utilized including caps, mask, sterile gowns, sterile gloves, sterile drape, hand hygiene and skin antiseptic. A timeout was performed prior to the initiation of the procedure. Patient was position in the supine position on the fluoroscopy table with the right arm abducted 90 degrees. Ultrasound survey of the upper extremity was performed with images stored and sent to PACs. The right brachial vein was selected for access. Once the patient was prepped and draped in the usual sterile fashion, the skin and subcutaneous tissues were generously infiltrated with 1% lidocaine for local anesthesia. A micropuncture access kit was then used to access the targeted vein. Wire was passed centrally, confirmed to be within the venous system under fluoroscopy. A small stab incision was made with an 11 blade scalpel and the sheath was then placed over the wire. Estimated length of the catheter was then performed with the indwelling wire. Catheter was amputated at 38 cm length and placed with coaxial wire through the peel-away. Double-lumen, power injectable PICC in the basilic vein. Tip confirmed at the cavoatrial junction, and the catheter is ready for use. Stat lock was placed. Patient tolerated the procedure well  and remained hemodynamically stable throughout. No complications were encountered and no significant blood loss was encountered. IMPRESSION: Status post right brachial double-lumen power injectable PICC. Catheter ready for use. Signed, Dulcy Fanny. Earleen Newport, DO [facility] Radiology Electronically Signed   By: Corrie Mckusick D.O.   On: 05/30/2017 17:27    Review of Systems  Constitutional: Positive for malaise/fatigue. Negative for chills and fever.  HENT: Negative.   Eyes: Negative.   Respiratory: Negative.   Cardiovascular: Negative.   Gastrointestinal: Positive for diarrhea.  Genitourinary: Negative.  Negative for hematuria.  Musculoskeletal: Negative.   Skin: Negative.   Neurological: Negative.   Endo/Heme/Allergies: Negative.   Psychiatric/Behavioral: Negative.     There were no vitals taken for this visit. Physical Exam  Constitutional:  Ill appearing, at baseline.   HENT:  Head: Normocephalic.  Eyes: Pupils are equal, round, and reactive to light.  Neck: Normal range of motion.  Cardiovascular:  Mild regular tachycardia.   Respiratory: Effort normal.  GI: Soft.  Prior scars w/o hernias. RLQ iliostomy patent of liquid stool.   Genitourinary:  Genitourinary Comments: Lower abdominal drain with scant serous fluid that is non foul. No CVAT. Foley in place with medium yellow urine that is non-foul.   Musculoskeletal: Normal range of motion.  Neurological: She is alert.  Skin: Skin is warm.  Psychiatric: She has a normal mood and affect.     Assessment/Plan  Proceed as planned with operative cysto / exam under anesthesia / possible right retrograde and stent exchange. Risks, benefits, alternatives, expected peri-op course discussed previously and reiterated today.   Alexis Frock, MD 06/01/2017, 6:50 AM

## 2017-06-01 NOTE — Transfer of Care (Signed)
Immediate Anesthesia Transfer of Care Note  Patient: [name]  Procedure(s) Performed: CYSTOSCOPY WITH EXAM UNDER ANESTHESIA (N/A ) CYSTOGRAM (N/A )  Patient Location: PACU  Anesthesia Type:General  Level of Consciousness: awake, alert , oriented and patient cooperative  Airway & Oxygen Therapy: Patient Spontanous Breathing and Patient connected to face mask oxygen  Post-op Assessment: Report given to RN, Post -op Vital signs reviewed and stable and Patient moving all extremities  Post vital signs: Reviewed and stable  Last Vitals:  Vitals:   06/01/17 1014 06/01/17 1230  BP: 114/64   Pulse: (!) 101   Resp: 16 (P) 16  Temp: (!) 36.4 C   SpO2: 100%     Last Pain:  Vitals:   06/01/17 1230  TempSrc:   PainSc: (P) 0-No pain      Patients Stated Pain Goal: (P) 4 (97/94/80 1655)  Complications: No apparent anesthesia complications

## 2017-06-01 NOTE — Op Note (Signed)
NAMEHEBE, MERRIWETHER NO.:  [id number]  MEDICAL RECORD NO.:  [id number]  LOCATION:  [place]                        FACILITY:  [facility]  PHYSICIAN:  Alexis Frock, MD     DATE OF BIRTH:  03-Dec-1955  DATE OF PROCEDURE:                              OPERATIVE REPORT   DIAGNOSIS:  Prolonged bladder leak after bladder reconstruction.  PROCEDURE: 1. Cystoscopy with exam under anesthesia. 2. Cystogram with interpretation.  ESTIMATED BLOOD LOSS:  Nil.  COMPLICATION:  None.  SPECIMEN:  None.  FINDINGS: 1. Small anterior bladder defect along prior suture line.  This     appeared to be site of small bladder leak. 2. No evidence of foreign body or drain across the area of suspected     leak. 3. No evidence of persistent extravasation along the area of the     ureter or ureteral anastomoses. 4. No evidence of leakage per vagina.  INDICATION:  Ms. Herford is a pleasant, but quite unfortunate 61 year old woman with history of multiple cancers including breast cancer, ovarian cancer, metastatic endometrial cancer.  She is status post multiple pelvic procedures including complex hysterectomy, at which point she sustained a right ureteral injury.  This was initially managed with chronic stenting.  She wished to have a definitive procedure with reconstruction.  She has been tumor free for over 2 years.  She also has concomitant colostomy, wished to have this reversed as well.  As such, she underwent a complex reconstruction procedure and combined Urology and General Surgery procedure in late August 2018, where she had a Heineke-Mikulicz type bladder flap reconstruction to her proximal right strictured ureter with colostomy takedown and ileostomy.  She did quite well initially with no evidence of persistent bladder leakage.  Had her catheter removed; however, she then developed a urinoma due to what appeared to be a small anterior bladder leak.  She has been now with 2 trials  of prolonged catheterization and removal, but if leak persists, this is worrisome for possible frank bladder necrosis versus foreign body across this to help rule out potentially modifiable factors.  It was felt that operative cystoscopy with cystogram was warranted. Informed consent was obtained and placed in the medical record.  PROCEDURE IN DETAIL:  The patient being, [facility], was verified. Procedure being, cystoscopy, exam under anesthesia, cystogram, were confirmed.  Procedure was carried out.  Time-out was performed. Intravenous antibiotics were administered.  General LMA anesthesia was introduced.  Placed the patient into a low lithotomy position.  Sterile field was created by prepping and draping the patient's vagina, introitus, and proximal thighs.  After in-situ Foley catheter was removed, she does have an anterior suprapubic urinoma drain in place. This was capped and also prepped into the operative field. Cystourethroscopy was performed using rigid cystoscope with offset lens. Inspection of urinary bladder revealed excellent mucosa and bladder viability throughout.  The bladder had an elongated shape towards the right psoas muscle as anticipated with the stent in place that was not encrusted at all.  There appeared to be a small divot in the anterior inferior bladder along prior suture line.  There was some soft suture visible in the site.  There were no obvious drainage across this or foreign bodies.  It was appeared to be likely site of a small residual bladder leak.  There were no obvious, again, foreign bodies in this area.  This cystoscope was then exchanged for an 18-French Foley catheter, 10 mL of sterile water in the balloon, and a cystogram was obtained using a gravity filling with Cystografin of 250 mL.  Multiple AP and oblique projections were taken with gravity fill and no evidence of extravasation was noted whatsoever.  Then, gentle pressure cystogram was  performed with a Toomey syringe and a small amount of contrast was seen extravasating what appeared to be the anterior surface along the suspected area.  Bladder was then drained and final spot fluoroscopic images corroborated small volume persistent leak.  This was all consistent with a continued healing.  The patient likely needs more time, but no further surgical intervention at this point.  As such, her urinoma drain was connected to gravity drainage as was a Foley catheter. The procedure was terminated.  The patient tolerated procedure well. There were no immediate periprocedural complications.  The patient was taken to the postanesthesia care unit in stable condition.          ______________________________ Alexis Frock, MD     TM/MEDQ  D:  06/01/2017  T:  06/01/2017  Job:  [id number]

## 2017-06-01 NOTE — Discharge Instructions (Signed)
1 - You may have urinary urgency (bladder spasms) and bloody urine on / off with stent in place. This is normal.  2 - Please keep foley catheter and urinoma drain to gravity drainage.   3 -Call MD or go to ER for fever >102, severe pain / nausea / vomiting not relieved by medications, or acute change in medical status

## 2017-06-02 DIAGNOSIS — E039 Hypothyroidism, unspecified: Secondary | ICD-10-CM | POA: Diagnosis not present

## 2017-06-02 DIAGNOSIS — E119 Type 2 diabetes mellitus without complications: Secondary | ICD-10-CM | POA: Diagnosis not present

## 2017-06-02 DIAGNOSIS — E782 Mixed hyperlipidemia: Secondary | ICD-10-CM | POA: Diagnosis not present

## 2017-06-02 DIAGNOSIS — I10 Essential (primary) hypertension: Secondary | ICD-10-CM | POA: Diagnosis not present

## 2017-06-03 DIAGNOSIS — Z932 Ileostomy status: Secondary | ICD-10-CM | POA: Diagnosis not present

## 2017-06-03 DIAGNOSIS — N368 Other specified disorders of urethra: Secondary | ICD-10-CM | POA: Diagnosis not present

## 2017-06-03 DIAGNOSIS — L589 Radiodermatitis, unspecified: Secondary | ICD-10-CM | POA: Diagnosis not present

## 2017-06-03 DIAGNOSIS — K219 Gastro-esophageal reflux disease without esophagitis: Secondary | ICD-10-CM | POA: Diagnosis not present

## 2017-06-04 DIAGNOSIS — Z5181 Encounter for therapeutic drug level monitoring: Secondary | ICD-10-CM | POA: Diagnosis not present

## 2017-06-07 NOTE — ED Provider Notes (Signed)
[place] DEPT Provider Note   CSN: [id number] Arrival date & time: 05/27/17  1535     History   Chief Complaint Chief Complaint  Patient presents with  . Nausea  . Dehydration    HPI [name] is a 61 y.o. female.  HPI  76yF presenting for PICC placement. Complicated surgical history. Had PICC up until about a week ago. Feels like getting dehydrated. No fever or chills. Urine/ostomy output about the same. No fever. No acute pain.   Past Medical History:  Diagnosis Date  . Acute bacterial bronchitis 06/04/2015  . Anemia in neoplastic disease   . Benign essential HTN   . Breast cancer of upper-inner quadrant of left female breast ([place])   . 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 Cancer of corpus uteri, except isthmus [facility]) dx 10-15-2004 oncologist-- dr Denman [name] and dr Alvy Bimler     dx endometrial and ovarian cancer s/p  chemotheapy and surgery:  recurrent 11-19-2014 w/ radiation 01-29-2015 to 03-10-2015  . Cellulitis of left abdominal wall near colostomy 12/03/2014  . Chronic idiopathic neutropenia (HCC)    presumed related to chemotherapy March 2006--- followed by dr Alvy Bimler   . Complication of anesthesia    PONV  . Diabetic retinopathy, background ([place])   . DM type 2 (diabetes mellitus, type 2) ([place])    monitored by dr Legrand Como altheimer  . Dysuria   . GERD (gastroesophageal reflux disease)    takes omeprazole  . Hiatal hernia   . History of bronchitis   . History of gastric polyp    2014  duodenum  . History of radiation therapy    01-29-2015 to 03-10-2015  pelvis 50.4Gy  . Hypothyroidism    monitored by dr Legrand Como altheimer  . Malignant neoplasm of corpus uteri, except isthmus ([place]) 05/10/2012  . Malignant neoplasm of ovary ([place]) 05/10/2012  .  Mixed dyslipidemia   . Multiple thyroid nodules    Managed by Dr. Harlow Asa  . PONV (postoperative nausea and vomiting)    "scopolamine patch works for me"  . Radiation-induced dermatitis    contact dermatitis , radiation completed, rash only on ankles now.  . Reactive thrombocytosis 11/29/2014  . Right flank pain   . S/P colostomy ([place])   . Seasonal allergies   . Shoulder stiffness    bilateral   . Ureteral stricture, right UROLOGIT-  DR [facility]   CHRONIC--  TREATMENT URETERAL STENT  . Vitamin D deficiency   . Wears glasses     Patient Active Problem List   Diagnosis Date Noted  . 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 Ileus ([place]) 04/18/2017  . Protein-calorie malnutrition, moderate ([place]) 04/18/2017  . Pelvic abscess in female 04/16/2017  . Postoperative anemia 04/04/2017  . 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 Hot flashes related to aromatase inhibitor therapy 03/18/2017  . Ureteral stricture, right, s/p resection & bladder hitch reimplantation 04/01/2017 09/10/2016  . Vitamin D insufficiency 08/30/2016  . Breast cancer of upper-inner quadrant of left female breast ([place])   . Pelvic pain in female 03/19/2015  . Chronic anemia 12/30/2014  . Pancytopenia, acquired ([place]) 11/26/2014  . Morbid obesity ([place]) 11/20/2014  . 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 s/p resection/partial vaginectomy/ LAR/colostomy 11/19/2014  11/19/2014  . Abdominal pain 09/09/2014  . Postmenopausal bleeding 09/05/2014  . History of ovarian & endometrial cancer 07/17/2012  . Chronic neutropenia ([place]) 12/14/2011  . Primary hypothyroidism 12/14/2011  . DM type 2 (diabetes mellitus, type 2) ([place]) 12/14/2011  . Benign essential HTN 12/14/2011    Past Surgical History:  Procedure Laterality Date  . APPENDECTOMY    . EXCISION SOFT TISSUE MASS RIGHT FOREMAN  12-08-2006  . EYE SURGERY  as child    pytosis of eyelids repair  . IR CV LINE INJECTION  05/31/2017  . IR FLUORO GUIDE CV LINE LEFT  05/31/2017  . IR FLUORO GUIDE CV LINE RIGHT  04/06/2017  . IR FLUORO GUIDE CV MIDLINE PICC RIGHT  05/30/2017  . IR RADIOLOGIST EVAL & MGMT  05/03/2017  . IR US GUIDE VASC ACCESS LEFT  05/31/2017  . IR US GUIDE VASC ACCESS RIGHT  04/06/2017  . IR US GUIDE VASC ACCESS RIGHT  05/30/2017  . LAPAROSCOPIC CHOLECYSTECTOMY  1990  . MASTECTOMY Right 12/11/2015   PROPHYLACTIC   . MASTECTOMY COMPLETE / SIMPLE W/ SENTINEL NODE BIOPSY Left 12/11/2015   AXILLARY SENTINEL LYMPH NODE BIOPSY   . TONSILLECTOMY    . TOTAL ABDOMINAL HYSTERECTOMY  March 2006   [facility]   and Bilateral Salpingoophorectomy/  staging for Ovarian cancer/  an    OB History    No data available       Home Medications    Prior to Admission medications   Medication Sig Start Date End Date Taking? Authorizing Provider  anastrozole (ARIMIDEX) 1 MG tablet TAKE 1 TABLET DAILY 12/08/16  Yes Heath Lark, MD  aspirin EC 81 MG tablet Take 81 mg by mouth daily.   Yes [provider]  Biotin 5 MG TABS Take 5 mg by mouth every morning.    Yes [provider]  Cholecalciferol (VITAMIN D3) 10000 UNITS capsule Take 10,000 Units by mouth once a week. Sunday evening's   Yes [provider]  diphenhydrAMINE (BENADRYL) 25 MG tablet Take 25 mg by mouth at bedtime as needed for itching or sleep. Reported on 09/15/2015   Yes [provider]  diphenoxylate-atropine (LOMOTIL) 2.5-0.025 MG tablet Take 1-2 tablets by mouth every 8 (eight) hours. TO PREVENT LOOSE BOWEL MOVEMENTS 05/20/17  Yes [provider]  Famotidine-Ca Carb-Mag Hydrox (PEPCID COMPLETE PO) Take 1 tablet by mouth daily as needed (INDIGESTION).   Yes [provider]  filgrastim (NEUPOGEN) 480 MCG/1.6ML injection Inject 1.6 ml under the skin every 5 days for life Patient taking differently: Inject 480 mcg into the skin See admin instructions.  Inject 1.6 ml under the skin every 6 days for life 01/06/17  Yes Gorsuch, Ni, MD  levothyroxine (SYNTHROID, LEVOTHROID) 150 MCG tablet Take 150 mcg by mouth daily.   Yes [provider]  loperamide (IMODIUM) 2 MG capsule Take 1-2 capsules (2-4 mg total) by mouth every 8 (eight) hours as needed for diarrhea or loose stools (Use if >2 BM every 8 hours). 04/07/17  Yes Michael [name], MD  loratadine (CLARITIN) 10 MG tablet Take 10 mg by mouth daily.    Yes [provider]  metFORMIN (GLUCOPHAGE) 1000 MG tablet Take 1,000 mg by mouth 2 (two) times daily with a meal.    Yes [provider]  methocarbamol (ROBAXIN) 500 MG tablet Take 500 mg by mouth every 4 (four) hours as needed for muscle spasms.  04/14/17  Yes [provider]  omega-3 acid ethyl esters (LOVAZA) 1 G  capsule Take 1 g by mouth 2 (two) times daily.   Yes [provider]  ondansetron (ZOFRAN-ODT) 4 MG disintegrating tablet Take 4 mg by mouth every 6 (six) hours as needed for nausea or vomiting.  04/12/17  Yes [provider]  Polyethyl Glycol-Propyl Glycol (SYSTANE OP) Place 1 drop into both eyes at bedtime.    Yes [provider]  Prenatal Vit-Fe Fumarate-FA (PRENATAL VITAMIN PO) Take 1 capsule by mouth daily. Takes prenatal because there are no dyes in it   Yes [provider]  rosuvastatin (CRESTOR) 10 MG tablet Take 10 mg by mouth every evening.    Yes [provider]  sitaGLIPtin (JANUVIA) 100 MG tablet Take 100 mg by mouth every morning.    Yes [provider]  Vitamins A & D (VITAMIN A & D) ointment Apply 1 application topically as needed for dry skin.   Yes [provider]  HYDROmorphone (DILAUDID) 2 MG tablet Take 1-2 tablets (2-4 mg total) by mouth every 4 (four) hours as needed for moderate pain or severe pain. Post-operatively. 06/01/17   Alexis Frock, MD  losartan (COZAAR) 100 MG tablet Take 1 tablet (100 mg total) by mouth  daily. Patient not taking: Reported on 04/16/2017 03/18/17   Heath Lark, MD  prochlorperazine (COMPAZINE) 10 MG tablet Take 1 tablet (10 mg total) by mouth 2 (two) times daily as needed for nausea or vomiting. 05/28/17   Virgel Manifold, MD  promethazine (PHENERGAN) 25 MG tablet Take 1 tablet (25 mg total) by mouth every 6 (six) hours as needed for nausea or vomiting. 05/28/17   Virgel Manifold, MD    Family History Family History  Problem Relation Age of Onset  . Cancer Mother 35       stomach ca  . Hypertension Mother   . Cancer Father 81       prostate ca  . Diabetes Father   . Heart disease Father        CABG  . Breast cancer Maternal Aunt        dx in her 43s  . Lymphoma Paternal Aunt   . Brain cancer Paternal Grandfather   . Ovarian cancer Other   . Diabetes Sister   . Hypertension Brother y-10  . Heart disease Brother        CABG  . Diabetes Brother     Social History Social History   Tobacco Use  . Smoking status: Never Smoker  . Smokeless tobacco: Never Used  Substance Use Topics  . Alcohol use: Yes    Comment: rare social  . Drug use: No     Allergies   Penicillins; Ultram [tramadol]; Adhesive [tape]; Cefaclor; Erythromycin; Trimethoprim; Ciprofloxacin; Fluconazole; Oxycodone; Pectin; and Sulfa antibiotics   Review of Systems Review of Systems   Physical Exam Updated Vital Signs BP (!) 143/71 (BP Location: Right Leg)   Pulse 96   Temp (!) 97.5 F (36.4 C) (Oral)   Resp 13   Ht 5' 2"  (1.575 m)   Wt 85.3 kg (188 lb)   SpO2 100%   BMI 34.39 kg/m   Physical Exam  Constitutional: She appears well-developed and well-nourished. No distress.  HENT:  Head: Normocephalic and atraumatic.  Eyes: Conjunctivae are normal. Right eye exhibits no discharge. Left eye exhibits no discharge.  Neck: Neck supple.  Cardiovascular: Normal rate, regular rhythm and normal heart sounds. Exam reveals no gallop and no friction rub.  No murmur heard. Pulmonary/Chest:  Effort normal and breath  sounds normal. No respiratory distress.  Abdominal: Soft. She exhibits no distension. There is no tenderness.  Multiple surgical scars noted. Ostomy with healthy appearing stoma.   Musculoskeletal: She exhibits no edema or tenderness.  Neurological: She is alert.  Skin: Skin is warm and dry.  Psychiatric: She has a normal mood and affect. Her behavior is normal. Thought content normal.  Nursing note and vitals reviewed.    ED Treatments / Results  Labs (all labs ordered are listed, but only abnormal results are displayed) Labs Reviewed  LIPASE, BLOOD - Abnormal; Notable for the following components:      Result Value   Lipase 94 (*)    All other components within normal limits  COMPREHENSIVE METABOLIC PANEL - Abnormal; Notable for the following components:   Sodium 130 (*)    CO2 17 (*)    Glucose, Bld 155 (*)    BUN 73 (*)    Creatinine, Ser 1.34 (*)    Calcium 10.7 (*)    Total Protein 9.2 (*)    Alkaline Phosphatase 166 (*)    GFR calc non Af Amer 41 (*)    GFR calc Af Amer 48 (*)    All other components within normal limits  CBC - Abnormal; Notable for the following components:   WBC 17.8 (*)    RDW 19.0 (*)    All other components within normal limits  URINALYSIS, ROUTINE W REFLEX MICROSCOPIC - Abnormal; Notable for the following components:   APPearance HAZY (*)    Hgb urine dipstick LARGE (*)    Protein, ur 100 (*)    Leukocytes, UA LARGE (*)    Bacteria, UA RARE (*)    Squamous Epithelial / LPF 0-5 (*)    All other components within normal limits    EKG  EKG Interpretation None       Radiology No results found.  Procedures Procedures (including critical care time)  Medications Ordered in ED Medications  ondansetron (ZOFRAN-ODT) disintegrating tablet 4 mg (4 mg Oral Given 05/27/17 1601)  lactated ringers bolus 1,000 mL (0 mLs Intravenous Stopped 05/27/17 2205)  lactated ringers bolus 1,000 mL (0 mLs Intravenous Stopped  05/27/17 2343)     Initial Impression / Assessment and Plan / ED Course  I have reviewed the triage vital signs and the nursing notes.  Pertinent labs & imaging results that were available during my care of the patient were reviewed by me and considered in my medical decision making (see chart for details).     61yF presenting for PICC placement. Unfortunately, cannot be placed from ED at this hour. Offered admission and trying to arrange possible placement next week versus admission for further hydration. She prefers to get additional IVF in ED and go home. Had 2L of LR in ED.   It has been determined that no acute conditions requiring further emergency intervention are present at this time. The patient has been advised of the diagnosis and plan. I reviewed any labs and imaging including any potential incidental findings. We have discussed signs and symptoms that warrant return to the ED and they are listed in the discharge instructions.    Final Clinical Impressions(s) / ED Diagnoses   Final diagnoses:  Dehydration    ED Discharge Orders        Ordered    promethazine (PHENERGAN) 25 MG tablet  Every 6 hours PRN     05/28/17 0025    prochlorperazine (COMPAZINE) 10 MG tablet  2 times daily  PRN     05/28/17 0025    IR Fluoro Guide CV Midline PICC Right     05/27/17 2148       Virgel Manifold, MD 06/07/17 1456

## 2017-06-08 DIAGNOSIS — Z933 Colostomy status: Secondary | ICD-10-CM | POA: Diagnosis not present

## 2017-06-09 DIAGNOSIS — Z933 Colostomy status: Secondary | ICD-10-CM | POA: Diagnosis not present

## 2017-06-11 ENCOUNTER — Encounter (HOSPITAL_COMMUNITY): Payer: Self-pay | Admitting: *Deleted

## 2017-06-11 ENCOUNTER — Other Ambulatory Visit: Payer: Self-pay

## 2017-06-11 ENCOUNTER — Emergency Department (HOSPITAL_COMMUNITY)
Admission: EM | Admit: 2017-06-11 | Discharge: 2017-06-11 | Disposition: A | Discharge status: Home / Self Care | Payer: BLUE CROSS/BLUE SHIELD | Attending: Emergency Medicine | Admitting: Emergency Medicine

## 2017-06-11 ENCOUNTER — Emergency Department (HOSPITAL_COMMUNITY): Payer: BLUE CROSS/BLUE SHIELD

## 2017-06-11 DIAGNOSIS — N1330 Unspecified hydronephrosis: Secondary | ICD-10-CM | POA: Diagnosis not present

## 2017-06-11 DIAGNOSIS — Z7982 Long term (current) use of aspirin: Secondary | ICD-10-CM | POA: Insufficient documentation

## 2017-06-11 DIAGNOSIS — Z853 Personal history of malignant neoplasm of breast: Secondary | ICD-10-CM | POA: Insufficient documentation

## 2017-06-11 DIAGNOSIS — R21 Rash and other nonspecific skin eruption: Secondary | ICD-10-CM | POA: Insufficient documentation

## 2017-06-11 DIAGNOSIS — E039 Hypothyroidism, unspecified: Secondary | ICD-10-CM | POA: Diagnosis not present

## 2017-06-11 DIAGNOSIS — Z7984 Long term (current) use of oral hypoglycemic drugs: Secondary | ICD-10-CM | POA: Insufficient documentation

## 2017-06-11 DIAGNOSIS — E119 Type 2 diabetes mellitus without complications: Secondary | ICD-10-CM | POA: Insufficient documentation

## 2017-06-11 DIAGNOSIS — R399 Unspecified symptoms and signs involving the genitourinary system: Secondary | ICD-10-CM | POA: Insufficient documentation

## 2017-06-11 DIAGNOSIS — R509 Fever, unspecified: Secondary | ICD-10-CM | POA: Diagnosis not present

## 2017-06-11 DIAGNOSIS — N390 Urinary tract infection, site not specified: Secondary | ICD-10-CM | POA: Diagnosis not present

## 2017-06-11 DIAGNOSIS — Z79899 Other long term (current) drug therapy: Secondary | ICD-10-CM | POA: Insufficient documentation

## 2017-06-11 DIAGNOSIS — K219 Gastro-esophageal reflux disease without esophagitis: Secondary | ICD-10-CM | POA: Diagnosis not present

## 2017-06-11 DIAGNOSIS — M549 Dorsalgia, unspecified: Secondary | ICD-10-CM | POA: Insufficient documentation

## 2017-06-11 DIAGNOSIS — L589 Radiodermatitis, unspecified: Secondary | ICD-10-CM | POA: Diagnosis not present

## 2017-06-11 DIAGNOSIS — N368 Other specified disorders of urethra: Secondary | ICD-10-CM | POA: Diagnosis not present

## 2017-06-11 DIAGNOSIS — Z5181 Encounter for therapeutic drug level monitoring: Secondary | ICD-10-CM | POA: Diagnosis not present

## 2017-06-11 DIAGNOSIS — I10 Essential (primary) hypertension: Secondary | ICD-10-CM | POA: Insufficient documentation

## 2017-06-11 LAB — BASIC METABOLIC PANEL
Anion gap: 11 (ref 5–15)
BUN: 27 mg/dL — ABNORMAL HIGH (ref 6–20)
CO2: 17 mmol/L — ABNORMAL LOW (ref 22–32)
Calcium: 9.7 mg/dL (ref 8.9–10.3)
Chloride: 104 mmol/L (ref 101–111)
Creatinine, Ser: 1.01 mg/dL — ABNORMAL HIGH (ref 0.44–1.00)
GFR calc Af Amer: >60 mL/min (ref >60)
GFR calc non Af Amer: 58 mL/min — ABNORMAL LOW (ref >60)
Glucose, Bld: 154 mg/dL — ABNORMAL HIGH (ref 65–99)
Potassium: 4.3 mmol/L (ref 3.5–5.1)
Sodium: 132 mmol/L — ABNORMAL LOW (ref 135–145)

## 2017-06-11 LAB — CBC WITH DIFFERENTIAL/PLATELET
Basophils Absolute: 0 10*3/uL (ref 0.0–0.1)
Basophils Relative: 0 %
Eosinophils Absolute: 0.2 10*3/uL (ref 0.0–0.7)
Eosinophils Relative: 6 %
HCT: 34.5 % — ABNORMAL LOW (ref 36.0–46.0)
Hemoglobin: 11.5 g/dL — ABNORMAL LOW (ref 12.0–15.0)
Lymphocytes Relative: 16 %
Lymphs Abs: 0.7 10*3/uL (ref 0.7–4.0)
MCH: 28.9 pg (ref 26.0–34.0)
MCHC: 33.3 g/dL (ref 30.0–36.0)
MCV: 86.7 fL (ref 78.0–100.0)
Monocytes Absolute: 1 10*3/uL (ref 0.1–1.0)
Monocytes Relative: 25 %
Neutro Abs: 2.2 10*3/uL (ref 1.7–7.7)
Neutrophils Relative %: 53 %
Platelets: 255 10*3/uL (ref 150–400)
RBC: 3.98 MIL/uL (ref 3.87–5.11)
RDW: 18.1 % — ABNORMAL HIGH (ref 11.5–15.5)
WBC: 4.2 10*3/uL (ref 4.0–10.5)

## 2017-06-11 LAB — URINALYSIS, ROUTINE W REFLEX MICROSCOPIC
Bilirubin Urine: NEGATIVE
Glucose, UA: NEGATIVE mg/dL
Ketones, ur: NEGATIVE mg/dL
Nitrite: POSITIVE — AB
Protein, ur: 100 mg/dL — AB
Specific Gravity, Urine: 1.015 (ref 1.005–1.030)
pH: 6 (ref 5.0–8.0)

## 2017-06-11 LAB — I-STAT CG4 LACTIC ACID, ED: Lactic Acid, Venous: 1.39 mmol/L (ref 0.5–1.9)

## 2017-06-11 LAB — URINALYSIS, MICROSCOPIC (REFLEX)

## 2017-06-11 MED ORDER — IOPAMIDOL (ISOVUE-300) INJECTION 61%
INTRAVENOUS | Status: AC
  Filled 2017-06-11: qty 100

## 2017-06-11 MED ORDER — IOPAMIDOL (ISOVUE-300) INJECTION 61%
100.0000 mL | Freq: Once | INTRAVENOUS | Status: AC | PRN
  Administered 2017-06-11: 100 mL via INTRAVENOUS

## 2017-06-11 MED ORDER — CIPROFLOXACIN HCL 500 MG PO TABS
500.0000 mg | ORAL_TABLET | Freq: Once | ORAL | Status: AC
  Administered 2017-06-11: 500 mg via ORAL
  Filled 2017-06-11: qty 1

## 2017-06-11 MED ORDER — HEPARIN SOD (PORK) LOCK FLUSH 100 UNIT/ML IV SOLN
500.0000 [IU] | Freq: Once | INTRAVENOUS | Status: AC
  Administered 2017-06-11: 500 [IU]
  Filled 2017-06-11: qty 5

## 2017-06-11 MED ORDER — CIPROFLOXACIN HCL 500 MG PO TABS: 500.0000 mg | ORAL_TABLET | Freq: Two times a day (BID) | ORAL | 0 refills | Status: AC

## 2017-06-11 MED ORDER — HYDROMORPHONE HCL 1 MG/ML IJ SOLN
0.5000 mg | Freq: Once | INTRAMUSCULAR | Status: AC
  Administered 2017-06-11: 0.5 mg via INTRAVENOUS
  Filled 2017-06-11: qty 1

## 2017-06-11 NOTE — ED Triage Notes (Signed)
Pt presents with fever, chills, back pain, and a rash on her lower right leg that began this morning.  Pt has a Neupogen injection this morning through her PICC line.  Pt's PICC was flushed and changed her PICC dressing.  Husband reports pt had a temporal temperature of 103.2 at home.  Pt did not take any tylenol or ibuprofen for her fever.  Pt has foley catheter since August 31.  Pt a/o 4 and ambulatory but is a little weak. Pt also has an illostomy and an abd drain on her right lower abd.

## 2017-06-11 NOTE — ED Provider Notes (Signed)
[facility] DEPT Provider Note   CSN: [id number] Arrival date & time: 06/11/17  1406     History   Chief Complaint Chief Complaint  Patient presents with  . Fever  . Back Pain    HPI [name] is a 61 y.o. female with complicated past medical and surgical history including chronic idiopathic neutropenia (followed by Dr. Alvy Bimler), HTN, DM type II, dyslipidemia, GERD, breast cancer status post bilateral mastectomy, ileostomy and right upper quadrant, 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 s/p resection/partial vaginectomy, hx ovarian and endometrial cancer and most recently s/p right ureteral reimplant with psoas hitch (04/01/17)  injury secondary to right ureteral stricture with small anterior bladder leak found on cystoscopy 06/01/2017 performed by her urologist Dr. Tresa Moore.  She presents with multiple complaints including acute onset of a rash to the right anterior lower leg, bladder spasms, and fever.  Patient noted erythematous rash 2-3 days ago.  She states it is stinging and burning, she has tried corticosteroid cream with mild relief.  Denies any new shampoos, detergents, soaps, or lotions.  Denies any new medications.  She states this was the location of her Foley strap but moved it from there when she noted the rash. She has multiple allergies including tape causing blisters and thinks she is allergic to the fabric.   She had a fever of 103.67F temporally at home earlier today for which she did not take any medications.  She did take her Neupogen today which she takes every 6 days for her chronic neutropenia.  She endorses severe "bladder spasm" sensation when walking which began approximately 3 days ago.  Improves with rest.  No change in her urine output or quality of her urine.  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 Also endorses anal leakage of serosanguinous fluid with walking. She notes development of a small  shallow ulceration within her gluteal cleft which is tender. Home health has been applying dressings with improvement. She denies drainage from this site. Endorses chills and nausea. She denies CP, SOB, melena/hematochezia, or vomiting.  Also endorses aching "kidney pain" improved by massages.  She called her urologist office and spoke with the on-call provider who recommended presentation to the ED for further evaluation with concern for possible infection from her PICC line.  She denies any erythema or pain to the site of her PICC line.  The history is provided by the patient.  Back Pain   Associated symptoms include a fever and abdominal pain. Pertinent negatives include no chest pain.    Past Medical History:  Diagnosis Date  . Acute bacterial bronchitis 06/04/2015  . Anemia in neoplastic disease   . Benign essential HTN   . Breast cancer of upper-inner quadrant of left female breast ([place])   . 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 Cancer of corpus uteri, except isthmus [facility]) dx 10-15-2004 oncologist-- dr Denman [name] and dr Alvy Bimler     dx endometrial and ovarian cancer s/p  chemotheapy and surgery:  recurrent 11-19-2014 w/ radiation 01-29-2015 to 03-10-2015  . Cellulitis of left abdominal wall near colostomy 12/03/2014  . Chronic idiopathic neutropenia (HCC)    presumed related to chemotherapy March 2006--- followed by dr Alvy Bimler   . Complication of anesthesia    PONV  . Diabetic  retinopathy, background ([place])   . DM type 2 (diabetes mellitus, type 2) ([place])    monitored by dr Legrand Como altheimer  . Dysuria   . GERD (gastroesophageal reflux disease)    takes omeprazole  . Hiatal hernia   . History of bronchitis   . History of gastric polyp    2014  duodenum  . History of radiation therapy    01-29-2015 to  03-10-2015  pelvis 50.4Gy  . Hypothyroidism    monitored by dr Legrand Como altheimer  . Malignant neoplasm of corpus uteri, except isthmus ([place]) 05/10/2012  . Malignant neoplasm of ovary ([place]) 05/10/2012  . Mixed dyslipidemia   . Multiple thyroid nodules    Managed by Dr. Harlow Asa  . PONV (postoperative nausea and vomiting)    "scopolamine patch works for me"  . Radiation-induced dermatitis    contact dermatitis , radiation completed, rash only on ankles now.  . Reactive thrombocytosis 11/29/2014  . Right flank pain   . S/P colostomy ([place])   . Seasonal allergies   . Shoulder stiffness    bilateral   . Ureteral stricture, right UROLOGIT-  DR [facility]   CHRONIC--  TREATMENT URETERAL STENT  . Vitamin D deficiency   . Wears glasses     Patient Active Problem List   Diagnosis Date Noted  . 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 Ileus ([place]) 04/18/2017  . Protein-calorie malnutrition, moderate ([place]) 04/18/2017  . Pelvic abscess in female 04/16/2017  . Postoperative anemia 04/04/2017  . 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 Hot flashes related to aromatase inhibitor therapy 03/18/2017  . Ureteral stricture, right, s/p resection & bladder hitch reimplantation 04/01/2017 09/10/2016  . Vitamin D insufficiency 08/30/2016  . Breast cancer of upper-inner quadrant of left female breast ([place])   . Pelvic pain in female 03/19/2015  . Chronic anemia 12/30/2014  . Pancytopenia, acquired ([place]) 11/26/2014  . Morbid obesity ([place]) 11/20/2014  . 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 Abdominal pain 09/09/2014  . Postmenopausal bleeding 09/05/2014  . History of ovarian & endometrial cancer 07/17/2012  . Chronic neutropenia ([place]) 12/14/2011  . Primary hypothyroidism 12/14/2011  . DM type 2 (diabetes mellitus, type 2) ([place])  12/14/2011  . Benign essential HTN 12/14/2011    Past Surgical History:  Procedure Laterality Date  . APPENDECTOMY    . EXCISION SOFT TISSUE MASS RIGHT FOREMAN  12-08-2006  . EYE SURGERY  as child   pytosis of eyelids repair  . IR CV LINE INJECTION  05/31/2017  . IR FLUORO GUIDE CV LINE LEFT  05/31/2017  . IR FLUORO GUIDE CV LINE RIGHT  04/06/2017  . IR FLUORO GUIDE CV MIDLINE PICC RIGHT  05/30/2017  . IR RADIOLOGIST EVAL & MGMT  05/03/2017  . IR US GUIDE VASC ACCESS LEFT  05/31/2017  . IR US GUIDE VASC ACCESS RIGHT  04/06/2017  . IR US GUIDE VASC ACCESS RIGHT  05/30/2017  . LAPAROSCOPIC CHOLECYSTECTOMY  1990  . MASTECTOMY Right 12/11/2015   PROPHYLACTIC   . MASTECTOMY COMPLETE / SIMPLE W/ SENTINEL NODE BIOPSY Left 12/11/2015   AXILLARY SENTINEL LYMPH NODE BIOPSY   . TONSILLECTOMY    . TOTAL ABDOMINAL HYSTERECTOMY  March 2006   [facility]   and Bilateral Salpingoophorectomy/  staging for Ovarian cancer/  an    OB History    No data available       Home  Medications    Prior to Admission medications   Medication Sig Start Date End Date Taking? Authorizing Provider  anastrozole (ARIMIDEX) 1 MG tablet TAKE 1 TABLET DAILY 12/08/16  Yes Heath Lark, MD  aspirin EC 81 MG tablet Take 81 mg by mouth daily.   Yes [provider]  Biotin 5 MG TABS Take 5 mg by mouth every morning.    Yes [provider]  Cholecalciferol (VITAMIN D3) 10000 UNITS capsule Take 10,000 Units by mouth once a week. Sunday evening's   Yes [provider]  diphenoxylate-atropine (LOMOTIL) 2.5-0.025 MG tablet Take 1-2 tablets by mouth every 8 (eight) hours. TO PREVENT LOOSE BOWEL MOVEMENTS 05/20/17  Yes [provider]  Famotidine-Ca Carb-Mag Hydrox (PEPCID COMPLETE PO) Take 1 tablet by mouth daily as needed (INDIGESTION).   Yes [provider]  filgrastim (NEUPOGEN) 480 MCG/1.6ML injection Inject 1.6 ml under the skin every 5 days for life Patient taking differently: Inject  480 mcg into the skin See admin instructions. Inject 1.6 ml under the skin every 6 days for life 01/06/17  Yes Gorsuch, Ni, MD  hydrocortisone cream 1 % Apply 1 application daily as needed topically for itching.   Yes [provider]  HYDROmorphone (DILAUDID) 2 MG tablet Take 1-2 tablets (2-4 mg total) by mouth every 4 (four) hours as needed for moderate pain or severe pain. Post-operatively. 06/01/17  Yes Alexis Frock, MD  levothyroxine (SYNTHROID, LEVOTHROID) 150 MCG tablet Take 150 mcg by mouth daily.   Yes [provider]  loratadine (CLARITIN) 10 MG tablet Take 10 mg by mouth daily.    Yes [provider]  metFORMIN (GLUCOPHAGE) 1000 MG tablet Take 1,000 mg by mouth 2 (two) times daily with a meal.    Yes [provider]  omega-3 acid ethyl esters (LOVAZA) 1 G capsule Take 1 g by mouth 2 (two) times daily.   Yes [provider]  ondansetron (ZOFRAN-ODT) 4 MG disintegrating tablet Take 4 mg by mouth every 6 (six) hours as needed for nausea or vomiting.  04/12/17  Yes [provider]  Polyethyl Glycol-Propyl Glycol (SYSTANE OP) Place 1 drop into both eyes at bedtime.    Yes [provider]  Prenatal Vit-Fe Fumarate-FA (PRENATAL VITAMIN PO) Take 1 capsule by mouth daily. Takes prenatal because there are no dyes in it   Yes [provider]  promethazine (PHENERGAN) 25 MG tablet Take 1 tablet (25 mg total) by mouth every 6 (six) hours as needed for nausea or vomiting. 05/28/17  Yes Virgel Manifold, MD  ranitidine (ZANTAC) 150 MG tablet Take 150 mg daily as needed by mouth for heartburn.   Yes [provider]  rosuvastatin (CRESTOR) 10 MG tablet Take 10 mg by mouth every evening.    Yes [provider]  sitaGLIPtin (JANUVIA) 100 MG tablet Take 100 mg by mouth every morning.    Yes [provider]  ciprofloxacin (CIPRO) 500 MG tablet Take 1 tablet (500 mg total) every 12 (twelve) hours for 7 days by mouth.  06/11/17 06/18/17  Nils Flack, Lamaria Hildebrandt A, PA-C  loperamide (IMODIUM) 2 MG capsule Take 1-2 capsules (2-4 mg total) by mouth every 8 (eight) hours as needed for diarrhea or loose stools (Use if >2 BM every 8 hours). Patient not taking: Reported on 06/11/2017 04/07/17   Michael [name], MD  losartan (COZAAR) 100 MG tablet Take 1 tablet (100 mg total) by mouth daily. Patient not taking: Reported on 04/16/2017 03/18/17   Heath Lark, MD  prochlorperazine (COMPAZINE) 10 MG tablet Take 1 tablet (10 mg total) by mouth 2 (two) times daily as needed for nausea or vomiting. Patient not taking: Reported on 06/11/2017 05/28/17   Virgel Manifold, MD    Family History Family History  Problem Relation Age of Onset  . Cancer Mother 67       stomach ca  . Hypertension Mother   . Cancer Father 90       prostate ca  . Diabetes Father   . Heart disease Father        CABG  . Breast cancer Maternal Aunt        dx in her 77s  . Lymphoma Paternal Aunt   . Brain cancer Paternal Grandfather   . Ovarian cancer Other   . Diabetes Sister   . Hypertension Brother y-10  . Heart disease Brother        CABG  . Diabetes Brother     Social History Social History   Tobacco Use  . Smoking status: Never Smoker  . Smokeless tobacco: Never Used  Substance Use Topics  . Alcohol use: Yes    Comment: rare social  . Drug use: No     Allergies   Penicillins; Ultram [tramadol]; Adhesive [tape]; Cefaclor; Erythromycin; Trimethoprim; Ciprofloxacin; Fluconazole; Oxycodone; Pectin; and Sulfa antibiotics   Review of Systems Review of Systems  Constitutional: Positive for chills and fever.  Respiratory: Negative for shortness of breath.   Cardiovascular: Negative for chest pain.  Gastrointestinal: Positive for abdominal pain and nausea. Negative for blood in stool and vomiting.  Musculoskeletal: Positive for back pain.  Skin: Positive for rash and wound.  Allergic/Immunologic: Positive for immunocompromised state.  All other  systems reviewed and are negative.    Physical Exam Updated Vital Signs BP 119/68 (BP Location: Right Arm)   Pulse (!) 110   Temp 98.1 F (36.7 C) (Oral)   Resp 16   Ht 5' 3"  (1.6 m)   Wt 86.2 kg (190 lb)   SpO2 97%   BMI 33.66 kg/m   Physical Exam  Constitutional: She appears well-developed and well-nourished. No distress.  HENT:  Head: Normocephalic and atraumatic.  Eyes: Conjunctivae are normal. Right eye exhibits no discharge. Left eye exhibits no discharge.  Neck: Normal range of motion. Neck supple. No JVD present. No tracheal deviation present.  Cardiovascular: Normal rate, regular rhythm, normal heart sounds and intact distal pulses.  2+ radial and DP/PT pulses bl  Pulmonary/Chest: Effort normal and breath sounds normal.  Abdominal: Soft. She exhibits no distension. There is tenderness.  Ileostomy in the right upper quadrant with no surrounding erythema or tenderness.  Maximally tender to palpation in the right lower quadrant overlying bladder and overlying her urostomy site.  Urostomy with mild surrounding erythema but no drainage.  Genitourinary:  Genitourinary Comments: Small shallow ulceration within the gluteal cleft.  No drainage or bleeding.  Musculoskeletal: She exhibits no edema.  PICC line in the left upper extremity, nontender, no surrounding erythema or drainage.  Moves extremities spontaneously without difficulty appears to have normal strength  Neurological: She is alert.  Skin: Skin is warm and dry. No erythema.  Erythematous vesicular rash to the anterior right lower extremity distal to the knee.  Nontender to palpation.  No drainage noted.  No excoriations, desquamation, or bulla noted.  Psychiatric: She has a normal mood and affect. Her behavior is normal.  Nursing note and vitals reviewed.    ED Treatments / Results  Labs (all labs  ordered are listed, but only abnormal results are displayed) Labs Reviewed  CBC WITH DIFFERENTIAL/PLATELET -  Abnormal; Notable for the following components:      Result Value   Hemoglobin 11.5 (*)    HCT 34.5 (*)    RDW 18.1 (*)    All other components within normal limits  BASIC METABOLIC PANEL - Abnormal; Notable for the following components:   Sodium 132 (*)    CO2 17 (*)    Glucose, Bld 154 (*)    BUN 27 (*)    Creatinine, Ser 1.01 (*)    GFR calc non Af Amer 58 (*)    All other components within normal limits  URINALYSIS, ROUTINE W REFLEX MICROSCOPIC - Abnormal; Notable for the following components:   APPearance TURBID (*)    Hgb urine dipstick LARGE (*)    Protein, ur 100 (*)    Nitrite POSITIVE (*)    Leukocytes, UA LARGE (*)    All other components within normal limits  URINALYSIS, MICROSCOPIC (REFLEX) - Abnormal; Notable for the following components:   Bacteria, UA MANY (*)    Squamous Epithelial / LPF 0-5 (*)    All other components within normal limits  I-STAT CG4 LACTIC ACID, ED    EKG  EKG Interpretation None       Radiology Ct Abdomen Pelvis W Contrast  Result Date: 06/11/2017 CLINICAL DATA:  Fever chills, back pain. EXAM: CT ABDOMEN AND PELVIS WITH CONTRAST TECHNIQUE: Multidetector CT imaging of the abdomen and pelvis was performed using the standard protocol following bolus administration of intravenous contrast. CONTRAST:  13m ISOVUE-300 IOPAMIDOL (ISOVUE-300) INJECTION 61% COMPARISON:  05/03/2017 FINDINGS: Lower chest: No acute abnormality.  Intact breast implants. Hepatobiliary: No focal liver abnormality is seen. Status post cholecystectomy. No biliary dilatation. Pancreas: Unremarkable. No pancreatic ductal dilatation or surrounding inflammatory changes. Spleen: Normal in size without focal abnormality. Adrenals/Urinary Tract: Normal adrenal glands. Normal appearance of the left kidney. Moderate right hydronephrosis. Double-J ureteral catheter with proximal tip in the renal pelvis and distal tip in the expected location of the urinary bladder. Air within the  urinary bladder. Diffuse wall thickening of the bladder which is collapsed around urinary Foley. Stomach/Bowel: Stable postsurgical changes from colectomy with persistent soft tissue thickening and fluid in the presacral region. Patent ileostomy. Vascular/Lymphatic: No evidence of discrete lymphadenopathy. Reproductive: Post hysterectomy.  No adnexal masses. Other: Surgical drain in the pelvis anterior to the urinary bladder. No persistent fluid collections seen at this site. Musculoskeletal: No acute or significant osseous findings. IMPRESSION: No significant changes in the appearance of the abdomen with residual soft tissue thickening versus organized fluid collection in the presacral region. Intact ileostomy.  No evidence of small-bowel obstruction. Persistent right hydronephrosis with right ureteral catheter in place. Gas within the urinary bladder likely due to instrumentation. Diffuse wall thickening of the urinary bladder. Electronically Signed   By: DFidela SalisburyM.D.   On: 06/11/2017 18:35    Procedures Procedures (including critical care time)  Medications Ordered in ED Medications  HYDROmorphone (DILAUDID) injection 0.5 mg (0.5 mg Intravenous Given 06/11/17 1728)  iopamidol (ISOVUE-300) 61 % injection 100 mL (100 mLs Intravenous Contrast Given 06/11/17 1759)  ciprofloxacin (CIPRO) tablet 500 mg (500 mg Oral Given 06/11/17 2023)  heparin lock flush 100 unit/mL (500 Units Intracatheter Given 06/11/17 2024)  heparin lock flush 100 unit/mL (500 Units Intracatheter Given 06/11/17 2024)     Initial Impression / Assessment and Plan / ED Course  I have reviewed the  triage vital signs and the nursing notes.  Pertinent labs & imaging results that were available during my care of the patient were reviewed by me and considered in my medical decision making (see chart for details).     Patient resents with abdominal pain overlying where her bladder is in the right lower quadrant, fever at  home, and rash to the right lower extremity.  Afebrile, tachycardic while in the ED.  She denies chest pain or shortness of breath and I doubt DVT or PE.  Rash appears to be consistent with a contact dermatitis from the strap used to hold her foley in place. Doubt SJS, TENS, syphillis, SSS, or other life-threatening rash.  CBC and CMP appear improved from lab work 2 weeks ago with improving kidney function and no leukocytosis.  She is not leukopenic today. Lactate negative, low suspicion of sepsis or PICC line infection. UA suggestive of UTI. CT scan shows no significant changes in the appearance of the abdomen with residual soft tissue thickening versus organized fluid collection in the presacral region. Intact ileostomy.  No evidence of small-bowel obstruction. Persistent right hydronephrosis with right ureteral catheter in place. Gas within the urinary bladder likely due to instrumentation. Diffuse wall thickening of the urinary bladder. Suspect UTI given bladder spasms and UA.  She has follow-up with her urologist on Monday.  Will discharge with ciprofloxacin which she has tolerated well in the past for UTIs.  Discussed indications for return to the ED. Pt and patient's husband verbalized understanding of and agreement with plan and patient is safe for discharge home at this time.  No complaints prior to discharge.  Patient seen and evaluated Dr. Wilson Singer who agrees with assessment and plan at this time.   Final Clinical Impressions(s) / ED Diagnoses   Final diagnoses:  UTI symptoms  Rash    ED Discharge Orders        Ordered    ciprofloxacin (CIPRO) 500 MG tablet  Every 12 hours     06/11/17 2002       Debroah Baller 06/11/17 2034    Virgel Manifold, MD 06/14/17 1238

## 2017-06-11 NOTE — Discharge Instructions (Signed)
Please take all of your antibiotics until finished!   You may develop abdominal discomfort or diarrhea from the antibiotic.  You may help offset this with probiotics which you can buy or get in yogurt. Do not eat  or take the probiotics until 2 hours after your antibiotic.   Take Zofran as needed for nausea.  Apply hydrocortisone cream twice daily to the affected area.  Return to the ED if any concerning signs or symptoms of spread of rash with redness or streaking, or tenderness.  Follow-up with your urologist on Monday as scheduled.

## 2017-06-14 DIAGNOSIS — N182 Chronic kidney disease, stage 2 (mild): Secondary | ICD-10-CM | POA: Diagnosis not present

## 2017-06-14 DIAGNOSIS — E1122 Type 2 diabetes mellitus with diabetic chronic kidney disease: Secondary | ICD-10-CM | POA: Diagnosis not present

## 2017-06-14 DIAGNOSIS — D709 Neutropenia, unspecified: Secondary | ICD-10-CM | POA: Diagnosis not present

## 2017-06-14 DIAGNOSIS — N390 Urinary tract infection, site not specified: Secondary | ICD-10-CM | POA: Diagnosis not present

## 2017-06-18 DIAGNOSIS — K219 Gastro-esophageal reflux disease without esophagitis: Secondary | ICD-10-CM | POA: Diagnosis not present

## 2017-06-18 DIAGNOSIS — C539 Malignant neoplasm of cervix uteri, unspecified: Secondary | ICD-10-CM | POA: Diagnosis not present

## 2017-06-18 DIAGNOSIS — D631 Anemia in chronic kidney disease: Secondary | ICD-10-CM | POA: Diagnosis not present

## 2017-06-18 DIAGNOSIS — N368 Other specified disorders of urethra: Secondary | ICD-10-CM | POA: Diagnosis not present

## 2017-06-18 DIAGNOSIS — L589 Radiodermatitis, unspecified: Secondary | ICD-10-CM | POA: Diagnosis not present

## 2017-06-20 ENCOUNTER — Other Ambulatory Visit: Payer: Self-pay | Admitting: Surgery

## 2017-06-20 DIAGNOSIS — R198 Other specified symptoms and signs involving the digestive system and abdomen: Secondary | ICD-10-CM

## 2017-06-20 DIAGNOSIS — Z932 Ileostomy status: Principal | ICD-10-CM

## 2017-06-20 MED ORDER — ACETAMINOPHEN 160 MG/5ML PO SOLN: 325.0000 mg | ORAL | Status: DC | PRN

## 2017-06-20 MED ORDER — ACETAMINOPHEN 325 MG PO TABS: 325.0000 mg | ORAL_TABLET | ORAL | Status: DC | PRN

## 2017-06-20 MED ORDER — LACTATED RINGERS IV SOLN: INTRAVENOUS | Status: DC

## 2017-06-20 MED ORDER — LACTATED RINGERS IV BOLUS (SEPSIS): 1000.0000 mL | INTRAVENOUS | Status: AC

## 2017-06-20 MED ORDER — SODIUM CHLORIDE 0.9 % IV SOLN: 4.0000 mg | INTRAVENOUS | Status: DC | PRN

## 2017-06-20 MED ORDER — ACETAMINOPHEN 80 MG RE SUPP: 325.0000 mg | RECTAL | Status: DC | PRN

## 2017-06-20 MED ORDER — ONDANSETRON 4 MG PO TBDP
4.0000 mg | ORAL_TABLET | ORAL | Status: DC | PRN
Start: 2017-06-20 — End: 2017-09-04

## 2017-06-21 ENCOUNTER — Encounter: Payer: Self-pay | Admitting: Internal Medicine

## 2017-06-21 ENCOUNTER — Ambulatory Visit: Payer: BLUE CROSS/BLUE SHIELD | Admitting: Internal Medicine

## 2017-06-21 DIAGNOSIS — N3000 Acute cystitis without hematuria: Secondary | ICD-10-CM | POA: Diagnosis not present

## 2017-06-21 DIAGNOSIS — N739 Female pelvic inflammatory disease, unspecified: Secondary | ICD-10-CM | POA: Diagnosis not present

## 2017-06-21 IMAGING — XA DG RETROGRADE PYELOGRAM
1 series · 2 of 2 positions shown · non-contrast
Comparison: Prior retrograde ureteropyelogram 03/20/2016

CLINICAL DATA: 61-year-old female undergoing right-sided ureteral
stent exchange

EXAM:
RETROGRADE PYELOGRAM

[Series 1: unknown protocol · 0.20mm/px · 2 of 2 slices shown]
[im 1/2]
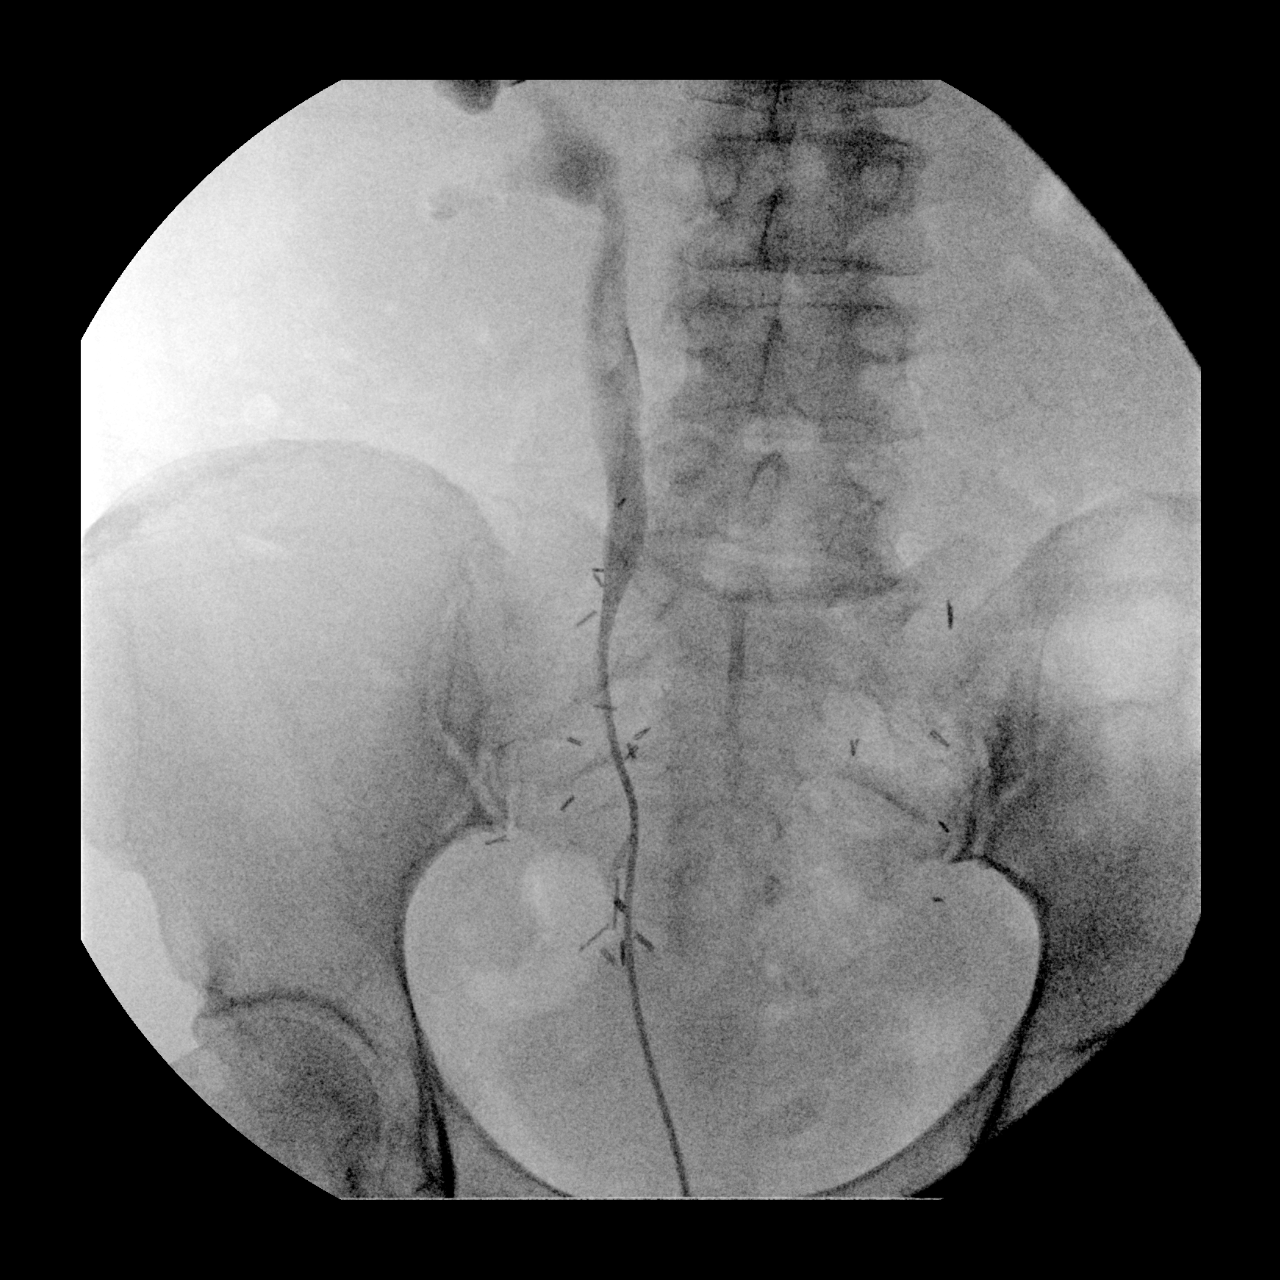
[im 2/2]
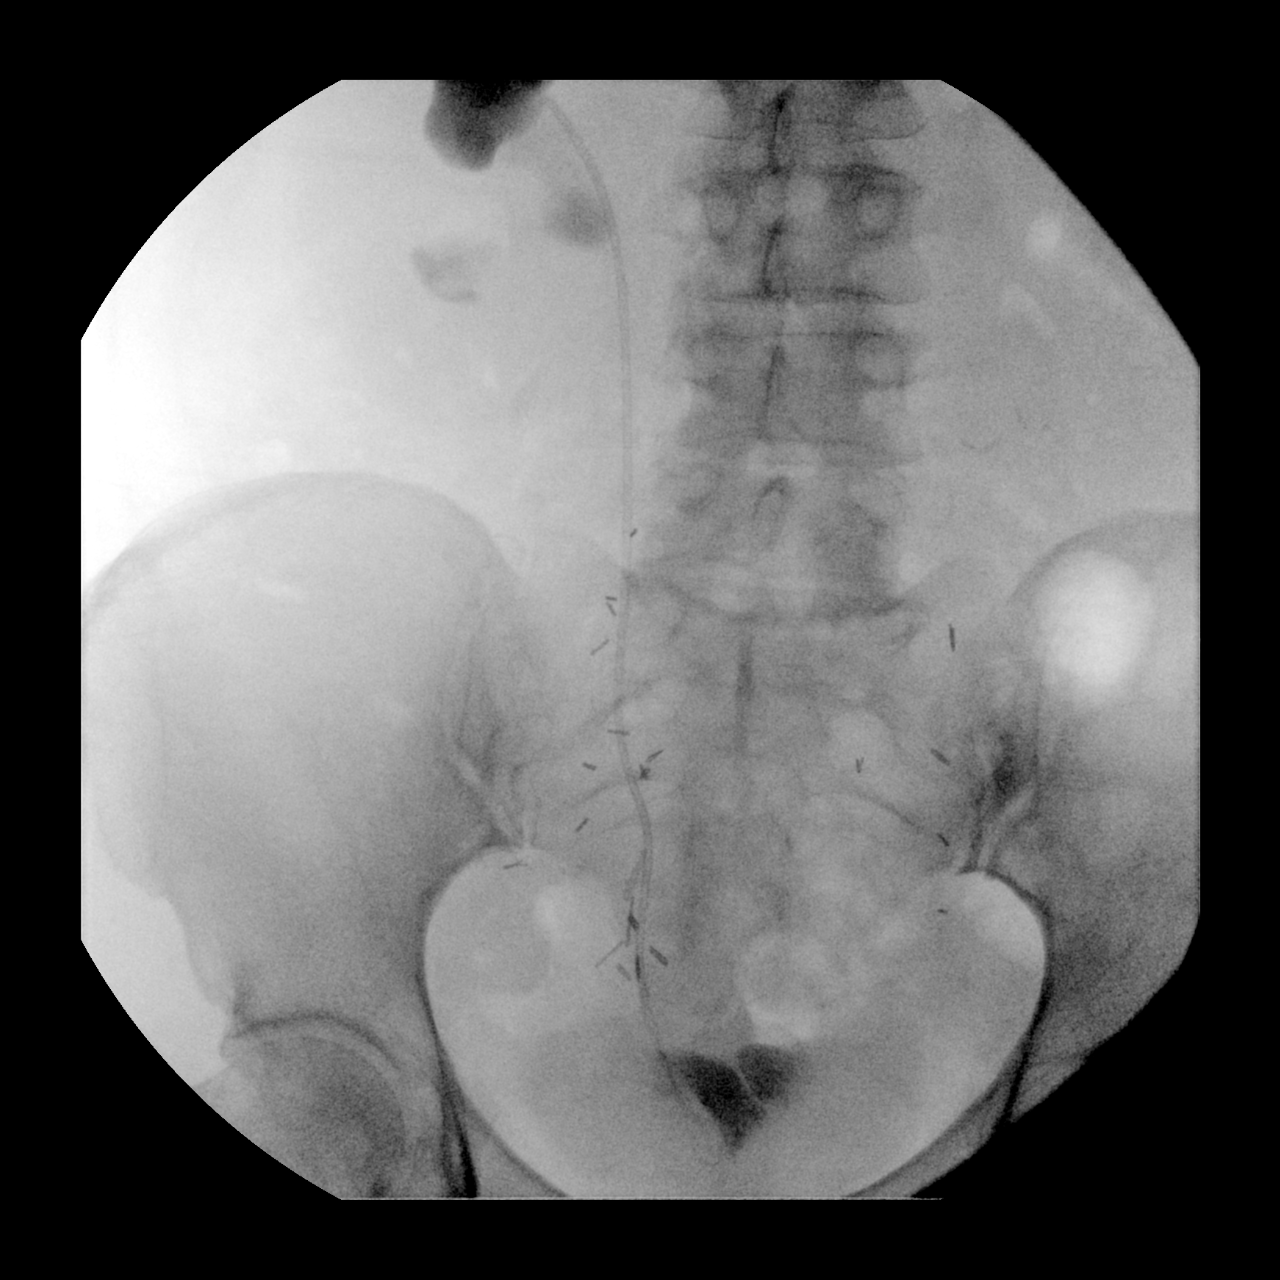

[2 of 2 positions shown; findings below may reference images not displayed]

FINDINGS: Two fluoroscopic spot images were obtained during right-sided
nephroureteral stent exchange. The images demonstrate partial
opacification of the right renal collecting system and right ureter.
There is mild hydroureteronephrosis into the ureter becomes
obstructed at the level of the proximal sacral ala. Surgical changes
are present in the pelvis with surgical clips. The final image
demonstrates a new double-J ureteral stent extending from the upper
pole infundibulum of the renal collecting system into the bladder.
IMPRESSION: Right-sided ureteral stent exchange as above.

## 2017-06-21 MED ORDER — CIPROFLOXACIN HCL 500 MG PO TABS: 500.0000 mg | ORAL_TABLET | Freq: Two times a day (BID) | ORAL | Status: AC

## 2017-06-21 NOTE — Assessment & Plan Note (Signed)
I am not entirely sure what caused her recent fever. She does not appear to have recurrence of her pelvic abscess. I'm not sure that she needs continued to ciprofloxacin therapy but she is very reluctant to stop now. She prefers to finish her final 6 days. She is hopeful that she will be able to get her percutaneous drain and Foley catheter out on 07/11/2017. I will see her back in 4 weeks.

## 2017-06-21 NOTE — Progress Notes (Signed)
[facility]  Patient Active Problem List   Diagnosis Date Noted  . Pelvic abscess in female 04/16/2017    Priority: High  . 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[place]) 06/20/2017  . 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 Ileus ([place]) 04/18/2017  . Protein-calorie malnutrition, moderate ([place]) 04/18/2017  . Postoperative anemia 04/04/2017  . Hot flashes related to aromatase inhibitor therapy 03/18/2017  . Ureteral stricture, right, s/p resection & bladder hitch reimplantation 04/01/2017 09/10/2016  . Vitamin D insufficiency 08/30/2016  . Breast cancer of upper-inner quadrant of left female breast ([place])   . Pelvic pain in female 03/19/2015  . Chronic anemia 12/30/2014  . UTI (urinary tract infection)   . Pancytopenia, acquired ([place]) 11/26/2014  . Morbid obesity ([place]) 11/20/2014  . 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 Abdominal pain 09/09/2014  . Postmenopausal bleeding 09/05/2014  . History of ovarian & endometrial cancer 07/17/2012  . Chronic neutropenia ([place]) 12/14/2011  . Primary hypothyroidism 12/14/2011  . DM type 2 (diabetes mellitus, type 2) ([place]) 12/14/2011  . Benign essential HTN 12/14/2011      Medication List        Accurate as of 06/21/17  3:16 PM. Always use your most recent med list.          anastrozole 1 MG tablet Commonly known as:  ARIMIDEX TAKE 1 TABLET DAILY   aspirin EC 81 MG tablet   Biotin 5 MG Tabs   ciprofloxacin 500 MG tablet Commonly known as:  CIPRO Take 1 tablet (500 mg total) by mouth 2 (two) times daily for 16 days.   diphenoxylate-atropine 2.5-0.025 MG tablet Commonly known as:  LOMOTIL   filgrastim 480 MCG/1.6ML injection Commonly known as:   NEUPOGEN Inject 1.6 ml under the skin every 5 days for life   hydrocortisone cream 1 %   HYDROmorphone 2 MG tablet Commonly known as:  DILAUDID Take 1-2 tablets (2-4 mg total) by mouth every 4 (four) hours as needed for moderate pain or severe pain. Post-operatively.   levothyroxine 150 MCG tablet Commonly known as:  SYNTHROID, LEVOTHROID   loperamide 2 MG capsule Commonly known as:  IMODIUM Take 1-2 capsules (2-4 mg total) by mouth every 8 (eight) hours as needed for diarrhea or loose stools (Use if >2 BM every 8 hours).   loratadine 10 MG tablet Commonly known as:  CLARITIN   losartan 100 MG tablet Commonly known as:  COZAAR Take 1 tablet (100 mg total) by mouth daily.   metFORMIN 1000 MG tablet Commonly known as:  GLUCOPHAGE   omega-3 acid ethyl esters 1 g capsule Commonly known as:  LOVAZA   ondansetron 4 MG disintegrating tablet Commonly known as:  ZOFRAN-ODT   PEPCID COMPLETE PO   PRENATAL VITAMIN PO   prochlorperazine 10 MG tablet Commonly known as:  COMPAZINE Take 1 tablet (10 mg total) by mouth 2 (two) times daily as needed for nausea or vomiting.   promethazine 25 MG tablet Commonly known as:  PHENERGAN Take 1 tablet (25 mg total) by mouth every 6 (six) hours as needed for nausea or vomiting.   ranitidine 150 MG tablet Commonly known as:  ZANTAC   rosuvastatin 10 MG tablet Commonly known as:  CRESTOR   sitaGLIPtin 100  MG tablet Commonly known as:  JANUVIA   SYSTANE OP   Vitamin D3 10000 units capsule       Where to Get Your Medications    Information about where to get these medications is not yet available   Ask your nurse or doctor about these medications  ciprofloxacin 500 MG tablet     Subjective: [name] is in for her routine follow-up visit. She completed 4 weeks of IV vancomycin and aztreonam for her polymicrobial, postop pelvic abscess on 05/19/2017. Her PICC line was removed but had to be replaced because she had ongoing needs  for IV fluids due to her high ostomy output. The PICC line was initially replaced in her right upper arm but she developed a large hematoma and it was removed and placed in her left arm. She underwent cystoscopy on 06/01/2017 which revealed a small defect in the bladder suture line which was the probable site of her postop urine leak. Her Foley catheter and percutaneous drains  She has been irrigating her percutaneous drain but the fluid has been leaking around the insertion site. She is having minimal drainage output.  She developed a temperature of 103.5 and was seen in the emergency department on 06/11/2017. A CT scan was repeated but showed no evidence of rent pelvic abscess. No cultures were obtained. She was started on empiric ciprofloxacin for presumed bladder infection. In addition to the fever she had some back pain, right greater than left, and some bladder spasms at that time. She defervesced and has felt better shortly after starting the ciprofloxacin area she was initially given a one-week course but this was extended after she saw her nephrologist, Dr. Hollie Salk. She has 6 more days remaining on her prescription. She is very eager to get all her tubes and stent out but also nervous that she will have more complications.  Review of Systems: Review of Systems  Constitutional: Positive for chills, fever and malaise/fatigue. Negative for diaphoresis and weight loss.  Cardiovascular: Negative for chest pain.  Gastrointestinal: Negative for abdominal pain, nausea and vomiting.       High-volume ileostomy output.  Genitourinary:       Some bladder spasms as noted in history of present illness.  Skin: Negative for rash.    Past Medical History:  Diagnosis Date  . Acute bacterial bronchitis 06/04/2015  . Anemia in neoplastic disease   . Benign essential HTN   . Breast cancer of upper-inner quadrant of left female breast ([place])   . 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 Cancer of corpus uteri, except isthmus [facility]) dx 10-15-2004 oncologist-- dr Denman [name] and dr Alvy Bimler     dx endometrial and ovarian cancer s/p  chemotheapy and surgery:  recurrent 11-19-2014 w/ radiation 01-29-2015 to 03-10-2015  . Cellulitis of left abdominal wall near colostomy 12/03/2014  . Chronic idiopathic neutropenia (HCC)    presumed related to chemotherapy March 2006--- followed by dr Alvy Bimler   . Complication of anesthesia    PONV  . Diabetic retinopathy, background ([place])   . DM type 2 (diabetes mellitus, type 2) ([place])    monitored by dr Legrand Como altheimer  . Dysuria   . GERD (gastroesophageal reflux disease)    takes omeprazole  . Hiatal hernia   . History of bronchitis   . History  of gastric polyp    2014  duodenum  . History of radiation therapy    01-29-2015 to 03-10-2015  pelvis 50.4Gy  . Hypothyroidism    monitored by dr Legrand Como altheimer  . Malignant neoplasm of corpus uteri, except isthmus ([place]) 05/10/2012  . Malignant neoplasm of ovary ([place]) 05/10/2012  . Mixed dyslipidemia   . Multiple thyroid nodules    Managed by Dr. Harlow Asa  . PONV (postoperative nausea and vomiting)    "scopolamine patch works for me"  . Radiation-induced dermatitis    contact dermatitis , radiation completed, rash only on ankles now.  . Reactive thrombocytosis 11/29/2014  . Right flank pain   . S/P colostomy ([place])   . Seasonal allergies   . Shoulder stiffness    bilateral   . Ureteral stricture, right UROLOGIT-  DR [facility]   CHRONIC--  TREATMENT URETERAL STENT  . Vitamin D deficiency   . Wears glasses     Social History   Tobacco Use  . Smoking status: Never Smoker  . Smokeless tobacco: Never Used  Substance Use Topics  . Alcohol use: Yes    Comment: rare social  . Drug use: No    Family History  Problem Relation Age  of Onset  . Cancer Mother 4       stomach ca  . Hypertension Mother   . Cancer Father 67       prostate ca  . Diabetes Father   . Heart disease Father        CABG  . Breast cancer Maternal Aunt        dx in her 28s  . Lymphoma Paternal Aunt   . Brain cancer Paternal Grandfather   . Ovarian cancer Other   . Diabetes Sister   . Hypertension Brother y-10  . Heart disease Brother        CABG  . Diabetes Brother     Allergies  Allergen Reactions  . Penicillins Swelling    Facial swelling/childhood allergy Has patient had a PCN reaction causing immediate rash, facial/tongue/throat swelling, SOB or lightheadedness with hypotension: Yes Has patient had a PCN reaction causing severe rash involving mucus membranes or skin necrosis: Yes Has patient had a PCN reaction that required hospitalization yes Has patient had a PCN reaction occurring within the last 10 years: No If all of the above answers are "NO", then may proceed with Cephalosporin use.   [name] Kitchen Ultram [Tramadol] Hives  . Adhesive [Tape]     blisters  . Cefaclor Rash    Ceclor  . Erythromycin     Gastritis, abd cramps  . Trimethoprim Rash  . Ciprofloxacin Other (See Comments)    Unknown On Dr notes   . Fluconazole Rash  . Oxycodone     " I just feel weird"  . Pectin Rash    Pectin ring for stoma  . Sulfa Antibiotics Rash    Objective: Vitals:   06/21/17 1422  BP: 135/81  Pulse: 80  Temp: 98.2 F (36.8 C)  TempSrc: Oral  Weight: 195 lb (88.5 kg)  Height: 5' 3"  (1.6 m)   Body mass index is 34.54 kg/m.  Physical Exam  Constitutional:  She remains somewhat dejected but overall is looking much better. She has gained 7 pounds in the past month. She is accompanied by her husband.  Abdominal: Soft. She exhibits no distension. There is no tenderness.  She has left-sided ileostomy with liquid brown stool in the bag. She has a clean dry  dressing over her lower abdominal percutaneous drain. She has a Foley catheter  in place.    Lab Results    Problem List Items Addressed This Visit      High   Pelvic abscess in female    I am not entirely sure what caused her recent fever. She does not appear to have recurrence of her pelvic abscess. I'm not sure that she needs continued to ciprofloxacin therapy but she is very reluctant to stop now. She prefers to finish her final 6 days. She is hopeful that she will be able to get her percutaneous drain and Foley catheter out on 07/11/2017. I will see her back in 4 weeks.        Unprioritized   UTI (urinary tract infection)   Relevant Medications   ciprofloxacin (CIPRO) 500 MG tablet       Michel Bickers, MD [facility] [place] [phone] pager   [phone] cell 06/21/2017, 3:16 PM

## 2017-06-22 ENCOUNTER — Ambulatory Visit (HOSPITAL_COMMUNITY)
Admission: RE | Admit: 2017-06-22 | Discharge: 2017-06-22 | Disposition: A | Discharge status: Home / Self Care | Payer: BLUE CROSS/BLUE SHIELD | Source: Ambulatory Visit | Attending: Internal Medicine | Admitting: Internal Medicine

## 2017-06-22 DIAGNOSIS — N739 Female pelvic inflammatory disease, unspecified: Secondary | ICD-10-CM | POA: Diagnosis not present

## 2017-06-22 LAB — BASIC METABOLIC PANEL
ANION GAP: 10 (ref 5–15)
BUN: 17 mg/dL (ref 6–20)
CHLORIDE: 104 mmol/L (ref 101–111)
CO2: 20 mmol/L — ABNORMAL LOW (ref 22–32)
Calcium: 8.9 mg/dL (ref 8.9–10.3)
Creatinine, Ser: 1.08 mg/dL — ABNORMAL HIGH (ref 0.44–1.00)
GFR calc Af Amer: >60 mL/min (ref >60)
GFR, EST NON AFRICAN AMERICAN: 54 mL/min — AB (ref >60)
Glucose, Bld: 128 mg/dL — ABNORMAL HIGH (ref 65–99)
POTASSIUM: 4.2 mmol/L (ref 3.5–5.1)
SODIUM: 134 mmol/L — AB (ref 135–145)

## 2017-06-22 MED ORDER — HEPARIN SOD (PORK) LOCK FLUSH 100 UNIT/ML IV SOLN
250.0000 [IU] | INTRAVENOUS | Status: AC | PRN
  Administered 2017-06-22: 250 [IU]
  Filled 2017-06-22 (×2): qty 5

## 2017-06-22 MED ORDER — SODIUM CHLORIDE 0.9% FLUSH: 10.0000 mL | INTRAVENOUS | Status: DC | PRN

## 2017-06-22 NOTE — Progress Notes (Signed)
Provider: Armond Hang  Procedure: Blood drawn from PICC line and specimen sent to lab.  Dressing changed per protocol.  Patient tolerated well.  Discharged ambulatory in satisfactory condition.

## 2017-06-28 DIAGNOSIS — L589 Radiodermatitis, unspecified: Secondary | ICD-10-CM | POA: Diagnosis not present

## 2017-06-28 DIAGNOSIS — K219 Gastro-esophageal reflux disease without esophagitis: Secondary | ICD-10-CM | POA: Diagnosis not present

## 2017-06-28 DIAGNOSIS — N368 Other specified disorders of urethra: Secondary | ICD-10-CM | POA: Diagnosis not present

## 2017-06-29 DIAGNOSIS — Z933 Colostomy status: Secondary | ICD-10-CM | POA: Diagnosis not present

## 2017-06-30 DIAGNOSIS — C539 Malignant neoplasm of cervix uteri, unspecified: Secondary | ICD-10-CM | POA: Diagnosis not present

## 2017-07-04 DIAGNOSIS — Z932 Ileostomy status: Secondary | ICD-10-CM | POA: Diagnosis not present

## 2017-07-04 DIAGNOSIS — N368 Other specified disorders of urethra: Secondary | ICD-10-CM | POA: Diagnosis not present

## 2017-07-04 DIAGNOSIS — L589 Radiodermatitis, unspecified: Secondary | ICD-10-CM | POA: Diagnosis not present

## 2017-07-04 DIAGNOSIS — K219 Gastro-esophageal reflux disease without esophagitis: Secondary | ICD-10-CM | POA: Diagnosis not present

## 2017-07-05 DIAGNOSIS — K219 Gastro-esophageal reflux disease without esophagitis: Secondary | ICD-10-CM | POA: Diagnosis not present

## 2017-07-05 DIAGNOSIS — D703 Neutropenia due to infection: Secondary | ICD-10-CM | POA: Diagnosis not present

## 2017-07-05 DIAGNOSIS — N912 Amenorrhea, unspecified: Secondary | ICD-10-CM | POA: Diagnosis not present

## 2017-07-05 DIAGNOSIS — N368 Other specified disorders of urethra: Secondary | ICD-10-CM | POA: Diagnosis not present

## 2017-07-05 DIAGNOSIS — L589 Radiodermatitis, unspecified: Secondary | ICD-10-CM | POA: Diagnosis not present

## 2017-07-05 DIAGNOSIS — Z933 Colostomy status: Secondary | ICD-10-CM | POA: Diagnosis not present

## 2017-07-06 DIAGNOSIS — N368 Other specified disorders of urethra: Secondary | ICD-10-CM | POA: Diagnosis not present

## 2017-07-12 DIAGNOSIS — D631 Anemia in chronic kidney disease: Secondary | ICD-10-CM | POA: Diagnosis not present

## 2017-07-12 DIAGNOSIS — N368 Other specified disorders of urethra: Secondary | ICD-10-CM | POA: Diagnosis not present

## 2017-07-12 DIAGNOSIS — L589 Radiodermatitis, unspecified: Secondary | ICD-10-CM | POA: Diagnosis not present

## 2017-07-12 DIAGNOSIS — K219 Gastro-esophageal reflux disease without esophagitis: Secondary | ICD-10-CM | POA: Diagnosis not present

## 2017-07-13 ENCOUNTER — Ambulatory Visit: Payer: BLUE CROSS/BLUE SHIELD | Attending: Surgery | Admitting: Physical Therapy

## 2017-07-13 DIAGNOSIS — M62838 Other muscle spasm: Secondary | ICD-10-CM | POA: Diagnosis not present

## 2017-07-13 DIAGNOSIS — M6281 Muscle weakness (generalized): Secondary | ICD-10-CM | POA: Insufficient documentation

## 2017-07-13 NOTE — Addendum Note (Signed)
Addended by: Lovett Calender D on: 07/13/2017 06:08 PM   Modules accepted: Orders

## 2017-07-13 NOTE — Patient Instructions (Addendum)
Use cream on buttocks around anus and cheek area  [facility] - has pain relief  [facility] - healing  [employer] [employer] [facility] [address], [place], [place] [address] Phone # [phone] Fax [phone]

## 2017-07-13 NOTE — Therapy (Signed)
[facility] [address], [place], [place], [address] Phone: [phone]   Fax:  [phone]  Physical Therapy Evaluation  Patient Details  Name: [name] MRN: [id number] Date of Birth: 1955/12/23 Referring Provider: Michael [name]   Encounter Date: 07/13/2017  PT End of Session - 07/13/17 1459    Visit Number  1    Date for PT Re-Evaluation  11/02/17    Authorization Type  [employer]    PT Start Time  1450    PT Stop Time  1530    PT Time Calculation (min)  40 min    Activity Tolerance  Treatment limited secondary to medical complications (Comment)    Behavior During Therapy  [facility] for tasks assessed/performed       Past Medical History:  Diagnosis Date  . Acute bacterial bronchitis 06/04/2015  . Anemia in neoplastic disease   . Benign essential HTN   . Breast cancer of upper-inner quadrant of left female breast ([place])   . 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 Cancer of corpus uteri, except isthmus [facility]) dx 10-15-2004 oncologist-- dr Denman [name] and dr Alvy Bimler     dx endometrial and ovarian cancer s/p  chemotheapy and surgery:  recurrent 11-19-2014 w/ radiation 01-29-2015 to 03-10-2015  . Cellulitis of left abdominal wall near colostomy 12/03/2014  . Chronic idiopathic neutropenia (HCC)    presumed related to chemotherapy March 2006--- followed by dr Alvy Bimler   . Complication of anesthesia    PONV  . Diabetic retinopathy, background ([place])   . DM type 2 (diabetes mellitus, type 2) ([place])    monitored by dr Legrand Como altheimer  . Dysuria   . GERD (gastroesophageal reflux disease)    takes omeprazole  . Hiatal hernia   . History of bronchitis   . History of gastric polyp    2014  duodenum  . History of radiation therapy    01-29-2015 to  03-10-2015  pelvis 50.4Gy  . Hypothyroidism    monitored by dr Legrand Como altheimer  . Malignant neoplasm of corpus uteri, except isthmus ([place]) 05/10/2012  . Malignant neoplasm of ovary ([place]) 05/10/2012  . Mixed dyslipidemia   . Multiple thyroid nodules    Managed by Dr. Harlow Asa  . PONV (postoperative nausea and vomiting)    "scopolamine patch works for me"  . Radiation-induced dermatitis    contact dermatitis , radiation completed, rash only on ankles now.  . Reactive thrombocytosis 11/29/2014  . Right flank pain   . S/P colostomy ([place])   . Seasonal allergies   . Shoulder stiffness    bilateral   . Ureteral stricture, right UROLOGIT-  DR [facility]   CHRONIC--  TREATMENT URETERAL STENT  . Vitamin D deficiency   . Wears glasses     Past Surgical History:  Procedure Laterality Date  . APPENDECTOMY    . BREAST RECONSTRUCTION WITH PLACEMENT OF TISSUE EXPANDER AND FLEX HD (ACELLULAR HYDRATED DERMIS) Bilateral 12/11/2015   Procedure: BILATERAL BREAST RECONSTRUCTION WITH PLACEMENT OF TISSUE EXPANDERS;  Surgeon: Irene Limbo, MD;  Location: [place];  Service: Plastics;  Laterality: Bilateral;  . COLONOSCOPY WITH PROPOFOL N/A 08/21/2013   Procedure: COLONOSCOPY WITH PROPOFOL;  Surgeon: Cleotis Nipper, MD;  [facility];  Service: Endoscopy;  Laterality: N/A;  . COLOSTOMY TAKEDOWN N/A 12/04/2014  Procedure: LAPROSCOPIC LYSIS OF ADHESIONS, SPLENIC MOBILIZATION, RELOCATION OF COLOSTOMY, DEBRIDEMENT INITIAL COLOSTOMY SITE;  Surgeon: Michael [name], MD;  [facility];  Service: General;  Laterality: N/A;  . CYSTOGRAM N/A 06/01/2017   Procedure: CYSTOGRAM;  Surgeon: Alexis Frock, MD;  [facility];  Service: Urology;  Laterality: N/A;  . CYSTOSCOPY W/ RETROGRADES Right 11/21/2015   Procedure: CYSTOSCOPY WITH RETROGRADE PYELOGRAM;  Surgeon: Alexis Frock, MD;  [facility];  Service: Urology;  Laterality: Right;  . CYSTOSCOPY W/ URETERAL STENT PLACEMENT Right 11/21/2015   Procedure:  CYSTOSCOPY WITH STENT REPLACEMENT;  Surgeon: Alexis Frock, MD;  [facility];  Service: Urology;  Laterality: Right;  . CYSTOSCOPY W/ URETERAL STENT PLACEMENT Right 03/10/2016   Procedure: CYSTOSCOPY WITH STENT REPLACEMENT;  Surgeon: Alexis Frock, MD;  Location[facility];  Service: Urology;  Laterality: Right;  . CYSTOSCOPY W/ URETERAL STENT PLACEMENT Right 06/30/2016   Procedure: CYSTOSCOPY WITH RETROGRADE PYELOGRAM/URETERAL STENT EXCHANGE;  Surgeon: Alexis Frock, MD;  Location: [facility];  Service: Urology;  Laterality: Right;  . CYSTOSCOPY W/ URETERAL STENT PLACEMENT N/A 06/01/2017   Procedure: CYSTOSCOPY WITH EXAM UNDER ANESTHESIA;  Surgeon: Alexis Frock, MD;  [facility];  Service: Urology;  Laterality: N/A;  . CYSTOSCOPY WITH RETROGRADE PYELOGRAM, URETEROSCOPY AND STENT PLACEMENT Right 03/20/2015   Procedure: CYSTOSCOPY WITH RETROGRADE PYELOGRAM, URETEROSCOPY WITH BALLOON DILATION AND STENT PLACEMENT ON RIGHT;  Surgeon: Alexis Frock, MD;  Location: [facility];  Service: Urology;  Laterality: Right;  . CYSTOSCOPY WITH RETROGRADE PYELOGRAM, URETEROSCOPY AND STENT PLACEMENT Right 05/02/2015   Procedure: CYSTOSCOPY WITH RIGHT RETROGRADE PYELOGRAM,  DIAGNOSTIC URETEROSCOPY AND STENT PULL ;  Surgeon: Alexis Frock, MD;  Location: [facility];  Service: Urology;  Laterality: Right;  . CYSTOSCOPY WITH RETROGRADE PYELOGRAM, URETEROSCOPY AND STENT PLACEMENT Right 09/05/2015   Procedure: CYSTOSCOPY WITH RETROGRADE PYELOGRAM,  AND STENT PLACEMENT;  Surgeon: Alexis Frock, MD;  [facility];  Service: Urology;  Laterality: Right;  . CYSTOSCOPY WITH RETROGRADE PYELOGRAM, URETEROSCOPY AND STENT PLACEMENT Right 04/01/2017   Procedure: CYSTOSCOPY WITH RETROGRADE PYELOGRAM, URETEROSCOPY AND STENT PLACEMENT;  Surgeon: Alexis Frock, MD;  [facility];  Service: Urology;  Laterality: Right;  . CYSTOSCOPY WITH STENT PLACEMENT  Right 10/27/2016   Procedure: CYSTOSCOPY WITH STENT CHANGE and right retrograde pyelogram;  Surgeon: Alexis Frock, MD;  Location: [facility];  Service: Urology;  Laterality: Right;  . EUS N/A 10/02/2014   Procedure: LOWER ENDOSCOPIC ULTRASOUND (EUS);  Surgeon: Arta Silence, MD;  Location: [facility];  Service: Endoscopy;  Laterality: N/A;  . EXCISION SOFT TISSUE MASS RIGHT FOREMAN  12-08-2006  . EYE SURGERY  as child   pytosis of eyelids repair  . INCISION AND DRAINAGE OF WOUND Bilateral 12/26/2015   Procedure: DEBRIDEMENT OF BILATERAL MASTECTOMY FLAPS;  Surgeon: Irene Limbo, MD;  Location: [place];  Service: Plastics;  Laterality: Bilateral;  . IR CV LINE INJECTION  05/31/2017  . IR FLUORO GUIDE CV LINE LEFT  05/31/2017  . IR FLUORO GUIDE CV LINE RIGHT  04/06/2017  . IR FLUORO GUIDE CV MIDLINE PICC RIGHT  05/30/2017  . IR RADIOLOGIST EVAL & MGMT  05/03/2017  . IR US GUIDE VASC ACCESS LEFT  05/31/2017  . IR US GUIDE VASC ACCESS RIGHT  04/06/2017  . IR US GUIDE VASC ACCESS RIGHT  05/30/2017  . LAPAROSCOPIC CHOLECYSTECTOMY  1990  . LIPOSUCTION WITH LIPOFILLING Bilateral 04/16/2016   Procedure: LIPOSUCTION WITH LIPOFILLING TO BILATERAL CHEST;  Surgeon: Irene Limbo, MD;  Location: [place];  Service: Plastics;  Laterality: Bilateral;  . MASTECTOMY Right 12/11/2015   PROPHYLACTIC   . MASTECTOMY COMPLETE / SIMPLE W/ SENTINEL NODE BIOPSY Left 12/11/2015   AXILLARY SENTINEL LYMPH NODE BIOPSY   . MASTECTOMY W/ SENTINEL NODE BIOPSY Bilateral 12/11/2015   Procedure: RIGHT PROPHYLACTIC MASTECTOMY, LEFT TOTAL MASTECTOMY WITH LEFT AXILLARY SENTINEL LYMPH NODE BIOPSY;  Surgeon: Stark Klein, MD;  Location: [place];  Service: General;  Laterality: Bilateral;  . OSTOMY N/A 11/19/2014   Procedure: OSTOMY;  Surgeon: Michael [name], MD;  [facility];  Service: General;  Laterality: N/A;  . PROCTOSCOPY N/A 04/01/2017   Procedure: RIDGE PROCTOSCOPY;   Surgeon: Michael [name], MD;  [facility];  Service: General;  Laterality: N/A;  . REMOVAL OF BILATERAL TISSUE EXPANDERS WITH PLACEMENT OF BILATERAL BREAST IMPLANTS Bilateral 04/16/2016   Procedure: REMOVAL OF BILATERAL TISSUE EXPANDERS WITH PLACEMENT OF BILATERAL BREAST IMPLANTS;  Surgeon: Irene Limbo, MD;  Location: [place];  Service: Plastics;  Laterality: Bilateral;  . ROBOTIC ASSISTED LAP VAGINAL HYSTERECTOMY N/A 11/19/2014   Procedure: ROBOTIC LYSIS OF ADHESIONS, CONVERTED TO LAPAROTOMY RADICAL UPPER VAGINECTOMY,LOW ANTERIOR BOWEL RESECTION, COLOSTOMY, BILATERAL URETERAL STENT PLACEMENT AND CYSTONOMY CLOSURE;  Surgeon: Everitt Amber, MD;  [facility];  Service: Gynecology;  Laterality: N/A;  . TISSUE EXPANDER FILLING Bilateral 12/26/2015   Procedure: EXPANSION OF BILATERAL CHEST TISSUE EXPANDERS (60 mL- Right; 75 mL- Left);  Surgeon: Irene Limbo, MD;  Location: [place];  Service: Plastics;  Laterality: Bilateral;  . TONSILLECTOMY    . TOTAL ABDOMINAL HYSTERECTOMY  March 2006   [facility]   and Bilateral Salpingoophorectomy/  staging for Ovarian cancer/  an  . XI ROBOTIC ASSISTED LOWER ANTERIOR RESECTION N/A 04/01/2017   Procedure: XI ROBOTIC VS LAPAROSCOPIC COLOSTOMY TAKEDOWN WITH LYSIS OF ADHESIONS.;  Surgeon: Michael [name], MD;  [facility];  Service: General;  Laterality: N/A;  ERAS PATHWAY    There were no vitals filed for this visit.   Subjective Assessment - 07/13/17 1453    Subjective  Patient has had bladder and ureter reshaped and has had a bladder leak into the abdomen.  They have been trying to take the catheter out but has been having a lot of drainage and leakage of urine when going without it.  Pt was referred to pelvic floor PT so she can work on control of muscles for reconnection of bowels and reverse the ostomy.  Denies pain other than at the ostomy site where she has had some issues with infection.  Reports that is under  control now.    Pertinent History  part of rectum removed    Patient Stated Goals  be able to have surgery for ostomy reversal    Currently in Pain?  No/denies         [facility] PT Assessment - 07/13/17 0001      Assessment   Medical Diagnosis  M62.89 (ICD-10-CM) - Pelvic floor dysfunction    Referring Provider  Michael [name]    Onset Date/Surgical Date  04/01/17 cystoscopy 10/31    Next MD Visit  07/18/17    Prior Therapy  No      Precautions   Precautions  None      Restrictions   Weight Bearing Restrictions  No      Balance Screen   Has the patient fallen in the past 6 months  No      Home Environment  Living Environment  Private residence    Living Arrangements  Spouse/significant other      Prior Function   Level of Independence  Independent      Cognition   Overall Cognitive Status  Within Functional Limits for tasks assessed      Posture/Postural Control   Posture/Postural Control  Postural limitations    Postural Limitations  Rounded Shoulders      ROM / Strength   AROM / PROM / Strength  Strength      Strength   Strength Assessment Site  Hip    Right/Left Hip  Right;Left    Right Hip Flexion  4/5    Right Hip Internal Rotation  4+/5    Right Hip ABduction  4/5    Left Hip Flexion  4-/5    Left Hip Internal Rotation  4/5    Left Hip ABduction  4-/5      Ambulation/Gait   Gait Pattern  Within Functional Limits             Objective measurements completed on examination: See above findings.    Pelvic Floor Special Questions - 07/13/17 0001    Prior Pelvic/Prostate Exam  Yes    Are you Pregnant or attempting pregnancy?  No    Prior Pregnancies  Yes    Number of Pregnancies  1    Number of Vaginal Deliveries  1 forceps    Urinary Leakage  No    Fecal incontinence  No    Skin Integrity  Erthema;Hemorroids    Pelvic Floor Internal Exam  pt informed and consent given to perform internal assessment    Exam Type  Rectal    Palpation  low tone  in glutes and ischiocavernosis Lt>Rt    Strength  Flicker    Strength # of reps  2    Strength # of seconds  1    Tone  low       [facility] Adult PT Treatment/Exercise - 07/13/17 0001      Self-Care   Self-Care  Other Self-Care Comments    Other Self-Care Comments   skin care for gluteal fold and around anus             PT Education - 07/13/17 1538    Education provided  Yes    Education Details  creams for skin irritation, and gave samples of gele and relevium    Person(s) Educated  Patient    Methods  Explanation;Handout    Comprehension  Verbalized understanding       PT Short Term Goals - 07/13/17 1753      PT SHORT TERM GOAL #1   Title  pt will have reduced redness around anal sphincter due to using appropriate skin care products    Time  4    Period  Weeks    Status  New    Target Date  08/10/17      PT SHORT TERM GOAL #2   Title  pt will be ind with initial HEP    Baseline  not yet issued    Time  4    Period  Weeks    Status  New    Target Date  08/10/17      PT SHORT TERM GOAL #3   Title  pt will tolerate one finger past the internal anal sphincter due to improved soft tissue length and function    Time  4    Period  Weeks  Status  New    Target Date  08/10/17        PT Long Term Goals - 07/13/17 1754      PT LONG TERM GOAL #1   Title  Pt will be ind with advanced HEP    Time  16    Period  Weeks    Status  New    Target Date  11/02/17      PT LONG TERM GOAL #2   Title  pt will be able to bulge pelvic floor in order to correctly excrete stool after ileostomy reversal    Time  16    Period  Weeks    Status  New    Target Date  11/02/17      PT LONG TERM GOAL #3   Title  pt will tolerate internal soft tissue palpation due to decreased soft tissue retstriction    Time  16    Period  Weeks    Status  New    Target Date  11/02/17      PT LONG TERM GOAL #4   Title  pt will be able to perform 3/5 MMT rectally and hold for 5 seconds for  improved control of BM when ileostomy is reversed    Time  16    Period  Weeks    Status  New    Target Date  11/02/17             Plan - 07/13/17 1738    Clinical Impression Statement  Patient present to clinic because she is taking steps to have take down of her ileostomy.    She has adhesions in anal canal and unable to insert one finger through the internal anal sphincter.  Pt has bilateral hip weakness and weak 1/5 MMT of external anal sphincter.  Pt will be expected to make slow progress due to the nature of her condition and restrictions due to soft tissue adhesions and limitations.  Pt will benefit from skilled PT to work towards pelvic floor muscle control and ability to relax and contract to control BM when she is ready for ostomy removal.      History and Personal Factors relevant to plan of care:  history of breast cancer and endometrial cancer removal, total hysterectomy, necrosis of ileostomy, bladder recontruction surgery    Clinical Presentation  Stable    Clinical Presentation due to:  pt is stable    Clinical Decision Making  Moderate    Rehab Potential  Excellent    Clinical Impairments Affecting Rehab Potential  history of breast cancer and endometrial cancer removal, total hysterectomy, necrosis of ileostomy, bladder recontruction surgery    PT Frequency  1x / week    PT Duration  Other (comment) 16 weeks    PT Treatment/Interventions  ADLs/Self Care Home Management;Biofeedback;Cryotherapy;Electrical Stimulation;Moist Heat;Ultrasound;Gait training;Stair training;Functional mobility training;Therapeutic activities;Therapeutic exercise;Balance training;Neuromuscular re-education;Patient/family education;Scar mobilization;Passive range of motion;Dry needling;Taping;Manual techniques    PT Next Visit Plan  soft tissue release to anal sphincter muscles, stretches, EMG biofeedback    Consulted and Agree with Plan of Care  Patient       Patient will benefit from skilled  therapeutic intervention in order to improve the following deficits and impairments:  Decreased scar mobility, Decreased skin integrity, Impaired tone, Increased muscle spasms, Decreased strength  Visit Diagnosis: Muscle weakness (generalized)  Other muscle spasm     Problem List Patient Active Problem List   Diagnosis Date Noted  . High output  ileostomy ([place]) 06/20/2017  . 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 Ileus ([place]) 04/18/2017  . Protein-calorie malnutrition, moderate ([place]) 04/18/2017  . Pelvic abscess in female 04/16/2017  . Postoperative anemia 04/04/2017  . 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 Hot flashes related to aromatase inhibitor therapy 03/18/2017  . Ureteral stricture, right, s/p resection & bladder hitch reimplantation 04/01/2017 09/10/2016  . Vitamin D insufficiency 08/30/2016  . Breast cancer of upper-inner quadrant of left female breast ([place])   . Pelvic pain in female 03/19/2015  . Chronic anemia 12/30/2014  . UTI (urinary tract infection)   . Pancytopenia, acquired ([place]) 11/26/2014  . Morbid obesity ([place]) 11/20/2014  . 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 Abdominal pain 09/09/2014  . Postmenopausal bleeding 09/05/2014  . History of ovarian & endometrial cancer 07/17/2012  . Chronic neutropenia ([place]) 12/14/2011  . Primary hypothyroidism 12/14/2011  . DM type 2 (diabetes mellitus, type 2) ([place]) 12/14/2011  . Benign essential HTN 12/14/2011    Zannie Cove, PT 07/13/2017, 6:06 PM  [name] [address], [place], [place], [address] Phone: [phone]   Fax:  [phone]  Name: [name] MRN: [id number] Date of Birth: 1956-06-20

## 2017-07-14 DIAGNOSIS — C541 Malignant neoplasm of endometrium: Secondary | ICD-10-CM | POA: Diagnosis not present

## 2017-07-14 DIAGNOSIS — N135 Crossing vessel and stricture of ureter without hydronephrosis: Secondary | ICD-10-CM | POA: Diagnosis not present

## 2017-07-14 DIAGNOSIS — N1330 Unspecified hydronephrosis: Secondary | ICD-10-CM | POA: Diagnosis not present

## 2017-07-18 DIAGNOSIS — Z932 Ileostomy status: Secondary | ICD-10-CM | POA: Diagnosis not present

## 2017-07-18 DIAGNOSIS — R32 Unspecified urinary incontinence: Secondary | ICD-10-CM | POA: Diagnosis not present

## 2017-07-18 DIAGNOSIS — R198 Other specified symptoms and signs involving the digestive system and abdomen: Secondary | ICD-10-CM | POA: Diagnosis not present

## 2017-07-18 DIAGNOSIS — Z9889 Other specified postprocedural states: Secondary | ICD-10-CM | POA: Diagnosis not present

## 2017-07-19 ENCOUNTER — Ambulatory Visit: Payer: BLUE CROSS/BLUE SHIELD | Admitting: Internal Medicine

## 2017-07-19 DIAGNOSIS — Z933 Colostomy status: Secondary | ICD-10-CM | POA: Diagnosis not present

## 2017-07-20 ENCOUNTER — Ambulatory Visit: Payer: BLUE CROSS/BLUE SHIELD | Admitting: Physical Therapy

## 2017-07-20 DIAGNOSIS — M62838 Other muscle spasm: Secondary | ICD-10-CM | POA: Diagnosis not present

## 2017-07-20 DIAGNOSIS — M6281 Muscle weakness (generalized): Secondary | ICD-10-CM | POA: Diagnosis not present

## 2017-07-20 NOTE — Therapy (Signed)
[facility] [address] [place], [place], [address] Phone: [phone]   Fax:  [phone]  Physical Therapy Treatment  Patient Details  Name: [name] MRN: [id number] Date of Birth: August 07, 1955 Referring Provider: Michael [name]   Encounter Date: 07/20/2017  PT End of Session - 07/20/17 1542    Visit Number  2    Date for PT Re-Evaluation  11/02/17    Authorization Type  BC[employer]    PT Start Time  1540 pt arrived 10 min late    PT Stop Time  1618    PT Time Calculation (min)  38 min    Activity Tolerance  Treatment limited secondary to medical complications (Comment)    Behavior During Therapy  [facility] for tasks assessed/performed       Past Medical History:  Diagnosis Date  . Acute bacterial bronchitis 06/04/2015  . Anemia in neoplastic disease   . Benign essential HTN   . Breast cancer of upper-inner quadrant of left female breast ([place])   . 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 Cancer of corpus uteri, except isthmus [facility]) dx 10-15-2004 oncologist-- dr Denman [name] and dr Alvy Bimler     dx endometrial and ovarian cancer s/p  chemotheapy and surgery:  recurrent 11-19-2014 w/ radiation 01-29-2015 to 03-10-2015  . Cellulitis of left abdominal wall near colostomy 12/03/2014  . Chronic idiopathic neutropenia (HCC)    presumed related to chemotherapy March 2006--- followed by dr Alvy Bimler   . Complication of anesthesia    PONV  . Diabetic retinopathy, background ([place])   . DM type 2 (diabetes mellitus, type 2) ([place])    monitored by dr Legrand Como altheimer  . Dysuria   . GERD (gastroesophageal reflux disease)    takes omeprazole  . Hiatal hernia   . History of bronchitis   . History of gastric polyp    2014  duodenum  . History of radiation  therapy    01-29-2015 to 03-10-2015  pelvis 50.4Gy  . Hypothyroidism    monitored by dr Legrand Como altheimer  . Malignant neoplasm of corpus uteri, except isthmus ([place]) 05/10/2012  . Malignant neoplasm of ovary ([place]) 05/10/2012  . Mixed dyslipidemia   . Multiple thyroid nodules    Managed by Dr. Harlow Asa  . PONV (postoperative nausea and vomiting)    "scopolamine patch works for me"  . Radiation-induced dermatitis    contact dermatitis , radiation completed, rash only on ankles now.  . Reactive thrombocytosis 11/29/2014  . Right flank pain   . S/P colostomy ([place])   . Seasonal allergies   . Shoulder stiffness    bilateral   . Ureteral stricture, right UROLOGIT-  DR [facility]   CHRONIC--  TREATMENT URETERAL STENT  . Vitamin D deficiency   . Wears glasses     Past Surgical History:  Procedure Laterality Date  . APPENDECTOMY    . BREAST RECONSTRUCTION WITH PLACEMENT OF TISSUE EXPANDER AND FLEX HD (ACELLULAR HYDRATED DERMIS) Bilateral 12/11/2015   Procedure: BILATERAL BREAST RECONSTRUCTION WITH PLACEMENT OF TISSUE EXPANDERS;  Surgeon: Irene Limbo, MD;  Location: [place];  Service: Plastics;  Laterality: Bilateral;  . COLONOSCOPY WITH PROPOFOL N/A 08/21/2013   Procedure: COLONOSCOPY WITH PROPOFOL;  Surgeon: Cleotis Nipper, MD;  [facility];  Service: Endoscopy;  Laterality: N/A;  . COLOSTOMY  TAKEDOWN N/A 12/04/2014   Procedure: LAPROSCOPIC LYSIS OF ADHESIONS, SPLENIC MOBILIZATION, RELOCATION OF COLOSTOMY, DEBRIDEMENT INITIAL COLOSTOMY SITE;  Surgeon: Michael [name], MD;  [facility];  Service: General;  Laterality: N/A;  . CYSTOGRAM N/A 06/01/2017   Procedure: CYSTOGRAM;  Surgeon: Alexis Frock, MD;  [facility];  Service: Urology;  Laterality: N/A;  . CYSTOSCOPY W/ RETROGRADES Right 11/21/2015   Procedure: CYSTOSCOPY WITH RETROGRADE PYELOGRAM;  Surgeon: Alexis Frock, MD;  [facility];  Service: Urology;  Laterality: Right;  . CYSTOSCOPY W/ URETERAL STENT PLACEMENT  Right 11/21/2015   Procedure: CYSTOSCOPY WITH STENT REPLACEMENT;  Surgeon: Alexis Frock, MD;  [facility];  Service: Urology;  Laterality: Right;  . CYSTOSCOPY W/ URETERAL STENT PLACEMENT Right 03/10/2016   Procedure: CYSTOSCOPY WITH STENT REPLACEMENT;  Surgeon: Alexis Frock, MD;  Location: [facility];  Service: Urology;  Laterality: Right;  . CYSTOSCOPY W/ URETERAL STENT PLACEMENT Right 06/30/2016   Procedure: CYSTOSCOPY WITH RETROGRADE PYELOGRAM/URETERAL STENT EXCHANGE;  Surgeon: Alexis Frock, MD;  Location: [facility];  Service: Urology;  Laterality: Right;  . CYSTOSCOPY W/ URETERAL STENT PLACEMENT N/A 06/01/2017   Procedure: CYSTOSCOPY WITH EXAM UNDER ANESTHESIA;  Surgeon: Alexis Frock, MD;  [facility];  Service: Urology;  Laterality: N/A;  . CYSTOSCOPY WITH RETROGRADE PYELOGRAM, URETEROSCOPY AND STENT PLACEMENT Right 03/20/2015   Procedure: CYSTOSCOPY WITH RETROGRADE PYELOGRAM, URETEROSCOPY WITH BALLOON DILATION AND STENT PLACEMENT ON RIGHT;  Surgeon: Alexis Frock, MD;  Location: [facility];  Service: Urology;  Laterality: Right;  . CYSTOSCOPY WITH RETROGRADE PYELOGRAM, URETEROSCOPY AND STENT PLACEMENT Right 05/02/2015   Procedure: CYSTOSCOPY WITH RIGHT RETROGRADE PYELOGRAM,  DIAGNOSTIC URETEROSCOPY AND STENT PULL ;  Surgeon: Alexis Frock, MD;  Location: [facility];  Service: Urology;  Laterality: Right;  . CYSTOSCOPY WITH RETROGRADE PYELOGRAM, URETEROSCOPY AND STENT PLACEMENT Right 09/05/2015   Procedure: CYSTOSCOPY WITH RETROGRADE PYELOGRAM,  AND STENT PLACEMENT;  Surgeon: Alexis Frock, MD;  [facility];  Service: Urology;  Laterality: Right;  . CYSTOSCOPY WITH RETROGRADE PYELOGRAM, URETEROSCOPY AND STENT PLACEMENT Right 04/01/2017   Procedure: CYSTOSCOPY WITH RETROGRADE PYELOGRAM, URETEROSCOPY AND STENT PLACEMENT;  Surgeon: Alexis Frock, MD;  [facility];  Service: Urology;  Laterality: Right;  .  CYSTOSCOPY WITH STENT PLACEMENT Right 10/27/2016   Procedure: CYSTOSCOPY WITH STENT CHANGE and right retrograde pyelogram;  Surgeon: Alexis Frock, MD;  Location: [facility];  Service: Urology;  Laterality: Right;  . EUS N/A 10/02/2014   Procedure: LOWER ENDOSCOPIC ULTRASOUND (EUS);  Surgeon: Arta Silence, MD;  Location: [facility];  Service: Endoscopy;  Laterality: N/A;  . EXCISION SOFT TISSUE MASS RIGHT FOREMAN  12-08-2006  . EYE SURGERY  as child   pytosis of eyelids repair  . INCISION AND DRAINAGE OF WOUND Bilateral 12/26/2015   Procedure: DEBRIDEMENT OF BILATERAL MASTECTOMY FLAPS;  Surgeon: Irene Limbo, MD;  Location: [place];  Service: Plastics;  Laterality: Bilateral;  . IR CV LINE INJECTION  05/31/2017  . IR FLUORO GUIDE CV LINE LEFT  05/31/2017  . IR FLUORO GUIDE CV LINE RIGHT  04/06/2017  . IR FLUORO GUIDE CV MIDLINE PICC RIGHT  05/30/2017  . IR RADIOLOGIST EVAL & MGMT  05/03/2017  . IR US GUIDE VASC ACCESS LEFT  05/31/2017  . IR US GUIDE VASC ACCESS RIGHT  04/06/2017  . IR US GUIDE VASC ACCESS RIGHT  05/30/2017  . LAPAROSCOPIC CHOLECYSTECTOMY  1990  . LIPOSUCTION WITH LIPOFILLING Bilateral 04/16/2016   Procedure: LIPOSUCTION  WITH LIPOFILLING TO BILATERAL CHEST;  Surgeon: Irene Limbo, MD;  Location: [place];  Service: Plastics;  Laterality: Bilateral;  . MASTECTOMY Right 12/11/2015   PROPHYLACTIC   . MASTECTOMY COMPLETE / SIMPLE W/ SENTINEL NODE BIOPSY Left 12/11/2015   AXILLARY SENTINEL LYMPH NODE BIOPSY   . MASTECTOMY W/ SENTINEL NODE BIOPSY Bilateral 12/11/2015   Procedure: RIGHT PROPHYLACTIC MASTECTOMY, LEFT TOTAL MASTECTOMY WITH LEFT AXILLARY SENTINEL LYMPH NODE BIOPSY;  Surgeon: Stark Klein, MD;  Location: [facility];  Service: General;  Laterality: Bilateral;  . OSTOMY N/A 11/19/2014   Procedure: OSTOMY;  Surgeon: Michael [name], MD;  [facility];  Service: General;  Laterality: N/A;  . PROCTOSCOPY N/A 04/01/2017    Procedure: RIDGE PROCTOSCOPY;  Surgeon: Michael [name], MD;  [facility];  Service: General;  Laterality: N/A;  . REMOVAL OF BILATERAL TISSUE EXPANDERS WITH PLACEMENT OF BILATERAL BREAST IMPLANTS Bilateral 04/16/2016   Procedure: REMOVAL OF BILATERAL TISSUE EXPANDERS WITH PLACEMENT OF BILATERAL BREAST IMPLANTS;  Surgeon: Irene Limbo, MD;  Location: [place];  Service: Plastics;  Laterality: Bilateral;  . ROBOTIC ASSISTED LAP VAGINAL HYSTERECTOMY N/A 11/19/2014   Procedure: ROBOTIC LYSIS OF ADHESIONS, CONVERTED TO LAPAROTOMY RADICAL UPPER VAGINECTOMY,LOW ANTERIOR BOWEL RESECTION, COLOSTOMY, BILATERAL URETERAL STENT PLACEMENT AND CYSTONOMY CLOSURE;  Surgeon: Everitt Amber, MD;  [facility];  Service: Gynecology;  Laterality: N/A;  . TISSUE EXPANDER FILLING Bilateral 12/26/2015   Procedure: EXPANSION OF BILATERAL CHEST TISSUE EXPANDERS (60 mL- Right; 75 mL- Left);  Surgeon: Irene Limbo, MD;  Location: [place];  Service: Plastics;  Laterality: Bilateral;  . TONSILLECTOMY    . TOTAL ABDOMINAL HYSTERECTOMY  March 2006   [facility]   and Bilateral Salpingoophorectomy/  staging for Ovarian cancer/  an  . XI ROBOTIC ASSISTED LOWER ANTERIOR RESECTION N/A 04/01/2017   Procedure: XI ROBOTIC VS LAPAROSCOPIC COLOSTOMY TAKEDOWN WITH LYSIS OF ADHESIONS.;  Surgeon: Michael [name], MD;  [facility];  Service: General;  Laterality: N/A;  ERAS PATHWAY    There were no vitals filed for this visit.  Subjective Assessment - 07/20/17 1722    Subjective  Dr. Johney Maine said there is nothing that would be contraindication for internal soft tissue work. States that he assessed things rectally at last visit and it was very painful.      Pertinent History  part of rectum removed    Patient Stated Goals  be able to have surgery for ostomy reversal    Currently in Pain?  No/denies                      [facility] Adult PT Treatment/Exercise - 07/20/17 0001      Self-Care    Other Self-Care Comments   scar massage educated and demonstrated      Neuro Re-ed    Neuro Re-ed Details   tactile cues to contract bulbospongeosis      Manual Therapy   Manual Therapy  Internal Pelvic Floor;Myofascial release    Manual therapy comments  pt informed and consent given to perform internal soft tissue mobs    Myofascial Release  scar tissue release    Internal Pelvic Floor  vaginal canal sof tissue adhesion and fascial release, urethra and fascial release around bladder, tapping and stroking to bulbospongeosis             PT Education - 07/20/17 1614    Education provided  Yes    Education Details  pelvic floor stretches  Person(s) Educated  Patient    Methods  Explanation;Handout    Comprehension  Verbalized understanding       PT Short Term Goals - 07/13/17 1753      PT SHORT TERM GOAL #1   Title  pt will have reduced redness around anal sphincter due to using appropriate skin care products    Time  4    Period  Weeks    Status  New    Target Date  08/10/17      PT SHORT TERM GOAL #2   Title  pt will be ind with initial HEP    Baseline  not yet issued    Time  4    Period  Weeks    Status  New    Target Date  08/10/17      PT SHORT TERM GOAL #3   Title  pt will tolerate one finger past the internal anal sphincter due to improved soft tissue length and function    Time  4    Period  Weeks    Status  New    Target Date  08/10/17        PT Long Term Goals - 07/13/17 1754      PT LONG TERM GOAL #1   Title  Pt will be ind with advanced HEP    Time  16    Period  Weeks    Status  New    Target Date  11/02/17      PT LONG TERM GOAL #2   Title  pt will be able to bulge pelvic floor in order to correctly excrete stool after ileostomy reversal    Time  16    Period  Weeks    Status  New    Target Date  11/02/17      PT LONG TERM GOAL #3   Title  pt will tolerate internal soft tissue palpation due to decreased soft tissue retstriction     Time  16    Period  Weeks    Status  New    Target Date  11/02/17      PT LONG TERM GOAL #4   Title  pt will be able to perform 3/5 MMT rectally and hold for 5 seconds for improved control of BM when ileostomy is reversed    Time  16    Period  Weeks    Status  New    Target Date  11/02/17            Plan - 07/20/17 1546    Clinical Impression Statement  First appointment since eval, so no goals met at this time. Pt was able to tolerate treatment vaginally.  Pt has some restrictions and was only able to get flicker of muscle contraction of anterior pelvic floor muscles.  Pt needed a lot of tactile cues to get just one flicker.  Pt had several scar tissue adhesions throughout abdoment and was educated on scar massage.  Pt will benefit from skilled PT to work on improved soft tissue length and abilty to return function to pelvic floor soft tissue.      Clinical Impairments Affecting Rehab Potential  history of breast cancer and endometrial cancer removal, total hysterectomy, necrosis of ileostomy, bladder recontruction surgery    PT Treatment/Interventions  ADLs/Self Care Home Management;Biofeedback;Cryotherapy;Electrical Stimulation;Moist Heat;Ultrasound;Gait training;Stair training;Functional mobility training;Therapeutic activities;Therapeutic exercise;Balance training;Neuromuscular re-education;Patient/family education;Scar mobilization;Passive range of motion;Dry needling;Taping;Manual techniques    PT Next Visit Plan  soft tissue release to anal sphincter muscles, review stretches, EMG biofeedback, abdominal scar massage    Consulted and Agree with Plan of Care  Patient       Patient will benefit from skilled therapeutic intervention in order to improve the following deficits and impairments:  Decreased scar mobility, Decreased skin integrity, Impaired tone, Increased muscle spasms, Decreased strength  Visit Diagnosis: Muscle weakness (generalized)  Other muscle  spasm     Problem List Patient Active Problem List   Diagnosis Date Noted  . 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 Ileus ([place]) 04/18/2017  . Protein-calorie malnutrition, moderate ([place]) 04/18/2017  . Pelvic abscess in female 04/16/2017  . Postoperative anemia 04/04/2017  . 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 Hot flashes related to aromatase inhibitor therapy 03/18/2017  . Ureteral stricture, right, s/p resection & bladder hitch reimplantation 04/01/2017 09/10/2016  . Vitamin D insufficiency 08/30/2016  . Breast cancer of upper-inner quadrant of left female breast ([place])   . Pelvic pain in female 03/19/2015  . Chronic anemia 12/30/2014  . UTI (urinary tract infection)   . Pancytopenia, acquired ([place]) 11/26/2014  . Morbid obesity ([place]) 11/20/2014  . 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 Abdominal pain 09/09/2014  . Postmenopausal bleeding 09/05/2014  . History of ovarian & endometrial cancer 07/17/2012  . Chronic neutropenia ([place]) 12/14/2011  . Primary hypothyroidism 12/14/2011  . DM type 2 (diabetes mellitus, type 2) ([place]) 12/14/2011  . Benign essential HTN 12/14/2011    Zannie Cove, PT 07/20/2017, 5:27 PM  [facility] [address], [place], [place], [address] Phone: [phone]   Fax:  [phone]  Name: Ivalene Platte MRN: [id number] Date of Birth: 1956/01/21

## 2017-07-20 NOTE — Patient Instructions (Signed)
  Position yourself as shown grabbing onto feet or behind the knees. You should feel a gentle stretch. Breathe in and allow the pelvic floor muscles to relax. Hold 1 min. 2 times per day.  Adductors, Frog Squat    Crouch with elbows inside knees . Gently push knees outward. Hold _30__ seconds.1 Repeat __1_ times per session. Do _2__ sessions per day.  Copyright  VHI. All rights reserved.   BACK: Child's Pose (Sciatica)    Sit in knee-chest position and reach arms forward. Separate knees for comfort. Hold position for _60__ breaths. Repeat _2__ times. Do _2__ times per day.  Copyright  VHI. All rights reserved.   Piriformis Stretch    Lying on back, pull right knee toward opposite shoulder. Hold _30___ seconds. Repeat __2__ times. Do _1___ sessions per day.  http://[email or web address].exer.us/258   Copyright  VHI. All rights reserved.   Piriformis Stretch, Supine    Lie supine, one ankle crossed onto opposite knee. Holding bottom leg behind knee, gently pull legs toward chest until stretch is felt in buttock of top leg. Hold _30__ seconds. For deeper stretch gently push top knee away from body.  Repeat _2__ times per session. Do _2__ sessions per day.  Copyright  VHI. All rights reserved.    Supine Knee-to-Chest, Unilateral    Lie on back, hands clasped behind one knee. Pull knee in toward chest until a comfortable stretch is felt in lower back and buttocks. Hold 30___ seconds.  Repeat __2_ times per session. Do _1__ sessions per day.  Copyright  VHI. All rights reserved.  Supine With Rotation    Lie on back with one knee drawn toward chest. Slowly bring bent leg across body until stretch is felt in lower back area. Hold _30__ seconds. Repeat to other side. Repeat _2__ times per session. Do __2_ sessions per day.  Copyright  VHI. All rights reserved.  Butterfly, Supine    Lie on back, feet together. Lower knees toward floor. Hold 60___ seconds. Repeat _2__ times per  session. Do _1__ sessions per day.  Copyright  VHI. All rights reserved.   [facility] [address] [place], [place] [address] Phone # [phone] Fax [phone]

## 2017-07-21 ENCOUNTER — Encounter: Payer: Self-pay | Admitting: Hematology and Oncology

## 2017-07-21 NOTE — Progress Notes (Signed)
Received PA request for Neupogen.  Submitted through Cover My Meds:   [name] Key: [id number] - PA Case ID: [id number] - Rx #: [id number] Need help? Call us at [phone]  Outcome  Approvedtoday  CaseId:[id number];Status:Approved;Review Type:Prior Auth;Coverage Start Date:06/21/2017;Coverage End Date:10/19/2017;

## 2017-07-28 DIAGNOSIS — B9689 Other specified bacterial agents as the cause of diseases classified elsewhere: Secondary | ICD-10-CM | POA: Diagnosis not present

## 2017-07-28 DIAGNOSIS — N390 Urinary tract infection, site not specified: Secondary | ICD-10-CM | POA: Diagnosis not present

## 2017-07-28 DIAGNOSIS — C541 Malignant neoplasm of endometrium: Secondary | ICD-10-CM | POA: Diagnosis not present

## 2017-07-28 DIAGNOSIS — N135 Crossing vessel and stricture of ureter without hydronephrosis: Secondary | ICD-10-CM | POA: Diagnosis not present

## 2017-07-29 ENCOUNTER — Ambulatory Visit: Payer: BLUE CROSS/BLUE SHIELD | Attending: Gynecologic Oncology | Admitting: Gynecologic Oncology

## 2017-07-29 ENCOUNTER — Encounter: Payer: Self-pay | Admitting: Gynecologic Oncology

## 2017-07-29 ENCOUNTER — Other Ambulatory Visit (HOSPITAL_BASED_OUTPATIENT_CLINIC_OR_DEPARTMENT_OTHER): Payer: BLUE CROSS/BLUE SHIELD

## 2017-07-29 VITALS — BP 118/72 | HR 105 | Temp 97.8°F | Resp 18 | Ht 63.0 in | Wt 196.7 lb

## 2017-07-29 DIAGNOSIS — D708 Other neutropenia: Secondary | ICD-10-CM | POA: Diagnosis not present

## 2017-07-29 DIAGNOSIS — Z8542 Personal history of malignant neoplasm of other parts of uterus: Secondary | ICD-10-CM

## 2017-07-29 DIAGNOSIS — N134 Hydroureter: Secondary | ICD-10-CM | POA: Diagnosis not present

## 2017-07-29 DIAGNOSIS — N135 Crossing vessel and stricture of ureter without hydronephrosis: Secondary | ICD-10-CM

## 2017-07-29 DIAGNOSIS — Z923 Personal history of irradiation: Secondary | ICD-10-CM

## 2017-07-29 DIAGNOSIS — Z853 Personal history of malignant neoplasm of breast: Secondary | ICD-10-CM | POA: Diagnosis not present

## 2017-07-29 DIAGNOSIS — C541 Malignant neoplasm of endometrium: Secondary | ICD-10-CM

## 2017-07-29 DIAGNOSIS — Z1509 Genetic susceptibility to other malignant neoplasm: Secondary | ICD-10-CM

## 2017-07-29 LAB — CBC WITH DIFFERENTIAL/PLATELET
BASO%: 0.2 % (ref 0.0–2.0)
Basophils Absolute: 0 10*3/uL (ref 0.0–0.1)
EOS%: 0.1 % (ref 0.0–7.0)
Eosinophils Absolute: 0 10*3/uL (ref 0.0–0.5)
HEMATOCRIT: 38 % (ref 34.8–46.6)
HGB: 12.4 g/dL (ref 11.6–15.9)
LYMPH%: 7 % — AB (ref 14.0–49.7)
MCH: 29.7 pg (ref 25.1–34.0)
MCHC: 32.6 g/dL (ref 31.5–36.0)
MCV: 90.9 fL (ref 79.5–101.0)
MONO#: 1 10*3/uL — AB (ref 0.1–0.9)
MONO%: 9 % (ref 0.0–14.0)
NEUT#: 9.1 10*3/uL — ABNORMAL HIGH (ref 1.5–6.5)
NEUT%: 83.7 % — AB (ref 38.4–76.8)
Platelets: 260 10*3/uL (ref 145–400)
RBC: 4.18 10*6/uL (ref 3.70–5.45)
RDW: 15.5 % — ABNORMAL HIGH (ref 11.2–14.5)
WBC: 10.9 10*3/uL — AB (ref 3.9–10.3)
lymph#: 0.8 10*3/uL — ABNORMAL LOW (ref 0.9–3.3)
nRBC: 0 % (ref 0–0)

## 2017-07-29 MED ORDER — HYDROCODONE-ACETAMINOPHEN 5-325 MG PO TABS: 1.0000 | ORAL_TABLET | Freq: Four times a day (QID) | ORAL | 0 refills | Status: DC | PRN

## 2017-07-29 NOTE — Patient Instructions (Signed)
Plan on follow up with Dr. Denman [name] in six months or sooner if needed.  We will obtain lab work today as well and notify you of the results.

## 2017-07-29 NOTE — Progress Notes (Signed)
Recurrent Endometrial cancer FOLLOWUP VISIT  Assessment:    61 y.o. year old with history of recurrent endometrioid endometrial/ovarian cancer.   S/p exploratory laparotomy, posterior supralevator pelvic exenteration, end colostomy, bladder repair, ureteral stenting with complete resection and negative margins on 11/09/14. S/p adjuvant radiation completed 03/10/15  Postoperative and post-radiation right hydroureter and distal ureteral obstruction with ureteral stent in situ.  No recurrence/persistence of tumor on imaging and physical exam (complete clinical response).   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 No specific follow-up or surveillance recommended by genetics counselor.  Plan: 1) History of recurrent endometrial/ovarian cancer: s/p complete resection with negative margins and s/p adjuvant radiation. No chemotherapy due to chronic idiopathic neutropenia.   CT imaging post treatment shows stable thickening of upper vagina and right peri-urethral tissues which is most likely radiation changes and postop changes. Will continue 6 monthly surveillance exams.  2) Right hydroureter and distal ureteral obstruction s/p re-implantation and colostomy reversal August 31st. Continuing follow-up with Dr Tresa Moore for stent replacements and management. Prescribed pain meds today.  3) neutropenia - Dr Alvy Bimler is managing this and we appreciate. Patient is having bone and joint aches and wonders if this is from more frequent Granix injections. Check CBC today given fever  4) breast cancer - stage IIA. ER/PR positive. Treated with surgery and arimidex.  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 Will have patient follow-up with Dr Cristina Gong after breast surgery for close colon cancer and upper GI cancer surveillance  6) Follow-up: I will see [name] for followup in June  2019.  HPI:  [name] is a 61 y.o. 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  She has a history of endometrial and ovarian endometrioid carcinoma treated in 2006 by Dr. [name] at [facility] in [place]. Her surgery (TAH, BSO) was followed by adjuvant chemotherapy with carboplatin plus paclitaxel due to the ovarian involvement and the presence of a cul de sac lesion also positive for disease. She denies receiving adjuvant radiation therapy. She is unclear if she had metastatic endometrial cancer to the ovary or duel primaries. She had a complete response to therapy however developed leukopenia in July 2010. 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 She sees Dr. Alvy Bimler for this and is treated with G-CSF injections.  She began experiencing rectal pain approximately in January 2016. She also reports narrowing of caliber of the stool. She denies hematochezia. She does report approximately 3 months of vaginal spotting. She's had no specific follow-up for her gynecologic cancers in the past 4 years.  As part of workup of her rectal pain she underwent a CT scan of the abdomen and pelvis on 09/12/2014. This demonstrated a new right perirectal mass abutting the vaginal cuff measuring 3.8 x 4.9 cm. There is a limited fat plane between the mass and the rectum posteriorly. Rectal invasion could not be excluded. There were no other masses identified in the abdomen and pelvis or lymphadenopathy. There is no other evidence of metastatic disease or recurrent disease. There was no hydronephrosis. A CA-125 drawn on 09/12/2014 was normal at 10.  PET was negative for extrapelvic disease. MRI defined the lesion as a 3.5x5cm lesion to the right of the rectum at the  vaginal cuff.  Colonoscopy was performed on 10/02/14 with transrectal [place] and biopsy and this revealed endometrioid adenocarcinoma. Of note, the lesion was not seen within the lumen of the rectum.   She then underwent a posterior supralevator exenteration with colostomy and bladder repair and stent placement on 10/28/49 without complications.  Her postoperative course was uncomplicate with the exception of development of postop anemia.  Her final pathology revealed endometrioid adenocarcinoma invading the vagina and rectum with negative margins on the specimen. The margin had been close (clinically) to the right pelvic sidewall which was marked with surgical clips.  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 It was treated with IV antibiotics and then on POD 15 she was taken to the OR for laparoscopic revision of the stoma with Dr Michael [name]. Postoperatively she had wound vac and packing for her stomal wound.  A retrograde cystogram on week 4 postop confirmed an intact bladder and the foley was removed.  She initially had some voiding issues with decreased sensation to void. We tested a post void residual in May, 2016 and this revealed adequate voiding.  On 12/30/14 she was admitted to [facility] with sepsis associated with Enterobacter Cloacae UTI. This was treated with IV antibiotics and then a prolonged course of oral cipro. Imaging performed at the time of admission (a CT of the abdo/pelvis) revealed: Bilateral double-J internal ureteral stents in adequate position with persistent mild bilateral hydronephrosis   She complete radiation therapy from 01/29/15 to 03/10/15 with 50Gy of external beam radiation and IMRT. She tolerated therapy well with minor skin irritation.  She had her ureteral stents removed in June, 2016, however, then developed right ureteral obstruction, hydroureter and pain. She went to the OR with Dr Tresa Moore on  03/20/15 for ureteral dilation and right stent placement (the left was draining well). No tumor was seen on cysto.   Right ureteral stent now out. No vaginal bleeding. Stoma working well. Gaining weight. Mood improved.   CT abdo/pelvis on 05/14/15 showed: no new lesions, stable thickening in right pelvis/distal right ureter consistent with radiation effect.  She has had bronchitis symptoms for >65month s/p levaquin trial.  The patient's CT in January 2017 showed progression of her right ureteral obstruction after stent removal. Dr MTresa Mooretook her to the OR on 09/05/15 for a cystoscopy, retrograde pyelogram and right ureteral stent placement.   The CT on 08/18/15 showed decreased attenuation of the right renal parenchyma, right hydronephrosis that was severe no excretion of contrast on that for graphic phase imaging. The perivascular soft tissue thickening in the pelvis and presacral regions grossly stable consistent with radiation changes.  The patient was diagnosed with ER/PR positive left breast cancer in April 2017. Surgery is scheduled for May. She is having a bilateral mastectomy with expander reconstruction.  Repeat CT imaging on 02/08/16 revealed : Stable post treatment changes in presacral region. No evidence of recurrent or metastatic carcinoma within the abdomen or pelvis. Interval placement of right ureteral stent in appropriate position. Moderate right hydroureteronephrosis remains stable. Stable benign left adrenal adenoma and hepatic steatosis.  On 06/30/16 she had replacement of her right ureteral stent with Dr MTresa Moore  On 10/07/16 CT surveillance was performed which showed: Stable exam.  No new or progressive findings. Stable appearance abnormal presacral soft tissue compatible with post treatment change. Internal right ureteral stent with persistent right hydroureteronephrosis. Differentially decreased perfusion of the right kidney suggests a  component of underlying obstructive uropathy.  Stable 13 mm left adrenal nodule, previously characterized as adenoma. A parastomal hernia was noted.  CT 01/27/17 showed stable findings with no evidence of recurrence. Persistent right hydroureter was again noted.  Interval Hx:  On 04/01/17 she underwent right ureteral resection, and bladder hitch with reimplantation and reversal of hartman's with temporary ileostomy complicated by postop development of urinoma which required a peritoneal drain and right ureteral stent.  She saw Dr Tresa Moore yesterday and provided a urine culture but was not prescribed antibiotics. She has a planned stent removal in January, 2019. In the past 24 hours she reports fevers to 102 (she is afebrile here). She has right flank pain that is "like a knife stabbing me in the back".   Review of systems: Constitutional:  She has no weight gain or weight loss. She has a low grade fever no chills. Eyes: No blurred vision Ears, Nose, Mouth, Throat: No dizziness, headaches or changes in hearing. No mouth sores. Cardiovascular: No chest pain, palpitations or edema. Respiratory:  No shortness of breath, wheezing  Gastrointestinal: She has normal bowel movements trhough stoma without diarrhea or constipation. She denies any nausea or vomiting. She denies blood in her stool or heart burn. Genitourinary:  See HPI Musculoskeletal: Denies muscle weakness or joint pains.  Skin:  She has no skin changes, rashes or itching Neurological:  Denies dizziness or headaches. No neuropathy, no numbness or tingling. Psychiatric:  She denies depression or anxiety. Hematologic/Lymphatic:   No easy bruising or bleeding  Allergies  Allergen Reactions  . Penicillins Swelling    Facial swelling/childhood allergy Has patient had a PCN reaction causing immediate rash, facial/tongue/throat swelling, SOB or lightheadedness with hypotension: Yes Has patient had a PCN reaction causing severe rash involving mucus membranes or skin necrosis: Yes Has  patient had a PCN reaction that required hospitalization yes Has patient had a PCN reaction occurring within the last 10 years: No If all of the above answers are "NO", then may proceed with Cephalosporin use.   [name] Kitchen Ultram [Tramadol] Hives  . Adhesive [Tape]     blisters  . Cefaclor Rash    Ceclor  . Erythromycin     Gastritis, abd cramps  . Trimethoprim Rash  . Ciprofloxacin Other (See Comments)    Unknown On Dr notes   . Fluconazole Rash  . Oxycodone     " I just feel weird"  . Pectin Rash    Pectin ring for stoma  . Sulfa Antibiotics Rash    Current Outpatient Medications on File Prior to Visit  Medication Sig Dispense Refill  . anastrozole (ARIMIDEX) 1 MG tablet TAKE 1 TABLET DAILY 90 tablet 3  . aspirin EC 81 MG tablet Take 81 mg by mouth daily.    . Biotin 5 MG TABS Take 5 mg by mouth every morning.     . Cholecalciferol (VITAMIN D3) 10000 UNITS capsule Take 10,000 Units by mouth once a week. Sunday evening's    . diphenoxylate-atropine (LOMOTIL) 2.5-0.025 MG tablet Take 1-2 tablets by mouth every 8 (eight) hours. TO PREVENT LOOSE BOWEL MOVEMENTS  0  . Famotidine-Ca Carb-Mag Hydrox (PEPCID COMPLETE PO) Take 1 tablet by mouth daily as needed (INDIGESTION).    . filgrastim (NEUPOGEN) 480 MCG/1.6ML injection Inject 1.6 ml under the skin every 5 days for life (Patient taking differently: Inject 480 mcg into the skin See admin instructions. Inject 1.6 ml under the skin every 6 days for life) 16 mL 11  .  hydrocortisone cream 1 % Apply 1 application daily as needed topically for itching.    [name] Kitchen HYDROmorphone (DILAUDID) 2 MG tablet Take 1-2 tablets (2-4 mg total) by mouth every 4 (four) hours as needed for moderate pain or severe pain. Post-operatively. 20 tablet 0  . levothyroxine (SYNTHROID, LEVOTHROID) 150 MCG tablet Take 150 mcg by mouth daily.    [name] Kitchen loperamide (IMODIUM) 2 MG capsule Take 1-2 capsules (2-4 mg total) by mouth every 8 (eight) hours as needed for diarrhea or loose stools  (Use if >2 BM every 8 hours). 30 capsule 0  . loratadine (CLARITIN) 10 MG tablet Take 10 mg by mouth daily.     [name] Kitchen losartan (COZAAR) 100 MG tablet Take 1 tablet (100 mg total) by mouth daily. (Patient not taking: Reported on 04/16/2017) 90 tablet 1  . metFORMIN (GLUCOPHAGE) 1000 MG tablet Take 1,000 mg by mouth 2 (two) times daily with a meal.     . omega-3 acid ethyl esters (LOVAZA) 1 G capsule Take 1 g by mouth 2 (two) times daily.    . ondansetron (ZOFRAN-ODT) 4 MG disintegrating tablet Take 4 mg by mouth every 6 (six) hours as needed for nausea or vomiting.     Vladimir Faster Glycol-Propyl Glycol (SYSTANE OP) Place 1 drop into both eyes at bedtime.     . Prenatal Vit-Fe Fumarate-FA (PRENATAL VITAMIN PO) Take 1 capsule by mouth daily. Takes prenatal because there are no dyes in it    . prochlorperazine (COMPAZINE) 10 MG tablet Take 1 tablet (10 mg total) by mouth 2 (two) times daily as needed for nausea or vomiting. (Patient not taking: Reported on 06/11/2017) 10 tablet 0  . promethazine (PHENERGAN) 25 MG tablet Take 1 tablet (25 mg total) by mouth every 6 (six) hours as needed for nausea or vomiting. (Patient not taking: Reported on 06/21/2017) 12 tablet 0  . ranitidine (ZANTAC) 150 MG tablet Take 150 mg daily as needed by mouth for heartburn.    . rosuvastatin (CRESTOR) 10 MG tablet Take 10 mg by mouth every evening.     . sitaGLIPtin (JANUVIA) 100 MG tablet Take 100 mg by mouth every morning.      Current Facility-Administered Medications on File Prior to Visit  Medication Dose Route Frequency Provider Last Rate Last Dose  . acetaminophen (TYLENOL) tablet 325-650 mg  325-650 mg Oral Q4H PRN Michael [name], MD       Or  . acetaminophen (TYLENOL) solution 325-650 mg  325-650 mg Per Tube Q4H PRN Michael [name], MD       Or  . acetaminophen (TYLENOL) suppository 320-640 mg  320-640 mg Rectal Q4H PRN Michael [name], MD      . lactated ringers bolus 1,000 mL  1,000 mL Intravenous Once per day on Mon Wed  Nathanial Millman, MD       Followed by  . [START ON 08/01/2017] lactated ringers infusion   Intravenous Continuous Michael [name], MD      . ondansetron (ZOFRAN-ODT) disintegrating tablet 4 mg  4 mg Oral Q4H PRN Michael [name], MD       Or  . ondansetron (ZOFRAN) 4 mg in sodium chloride 0.9 % 50 mL IVPB  4 mg Intravenous Q4H PRN Michael [name], MD        Past Medical History:  Diagnosis Date  . Acute bacterial bronchitis 06/04/2015  . Anemia in neoplastic disease   . Benign essential HTN   . Breast cancer of upper-inner quadrant of left female breast ([place])   .  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 Cancer of corpus uteri, except isthmus [facility]) dx 10-15-2004 oncologist-- dr Denman [name] and dr Alvy Bimler     dx endometrial and ovarian cancer s/p  chemotheapy and surgery:  recurrent 11-19-2014 w/ radiation 01-29-2015 to 03-10-2015  . Cellulitis of left abdominal wall near colostomy 12/03/2014  . Chronic idiopathic neutropenia (HCC)    presumed related to chemotherapy March 2006--- followed by dr Alvy Bimler   . Complication of anesthesia    PONV  . Diabetic retinopathy, background ([place])   . DM type 2 (diabetes mellitus, type 2) ([place])    monitored by dr Legrand Como altheimer  . Dysuria   . GERD (gastroesophageal reflux disease)    takes omeprazole  . Hiatal hernia   . History of bronchitis   . History of gastric polyp    2014  duodenum  . History of radiation therapy    01-29-2015 to 03-10-2015  pelvis 50.4Gy  . Hypothyroidism    monitored by dr Legrand Como altheimer  . Malignant neoplasm of corpus uteri, except isthmus ([place]) 05/10/2012  . Malignant neoplasm of ovary ([place]) 05/10/2012  . Mixed dyslipidemia   . Multiple thyroid nodules    Managed by Dr. Harlow Asa  . PONV (postoperative nausea and vomiting)    "scopolamine  patch works for me"  . Radiation-induced dermatitis    contact dermatitis , radiation completed, rash only on ankles now.  . Reactive thrombocytosis 11/29/2014  . Right flank pain   . S/P colostomy ([place])   . Seasonal allergies   . Shoulder stiffness    bilateral   . Ureteral stricture, right UROLOGIT-  DR [facility]   CHRONIC--  TREATMENT URETERAL STENT  . Vitamin D deficiency   . Wears glasses     Past Surgical History:  Procedure Laterality Date  . APPENDECTOMY    . BREAST RECONSTRUCTION WITH PLACEMENT OF TISSUE EXPANDER AND FLEX HD (ACELLULAR HYDRATED DERMIS) Bilateral 12/11/2015   Procedure: BILATERAL BREAST RECONSTRUCTION WITH PLACEMENT OF TISSUE EXPANDERS;  Surgeon: Irene Limbo, MD;  Location: [place];  Service: Plastics;  Laterality: Bilateral;  . COLONOSCOPY WITH PROPOFOL N/A 08/21/2013   Procedure: COLONOSCOPY WITH PROPOFOL;  Surgeon: Cleotis Nipper, MD;  [facility];  Service: Endoscopy;  Laterality: N/A;  . COLOSTOMY TAKEDOWN N/A 12/04/2014   Procedure: LAPROSCOPIC LYSIS OF ADHESIONS, SPLENIC MOBILIZATION, RELOCATION OF COLOSTOMY, DEBRIDEMENT INITIAL COLOSTOMY SITE;  Surgeon: Michael [name], MD;  [facility]: [facility];  Service: General;  Laterality: N/A;  . CYSTOGRAM N/A 06/01/2017   Procedure: CYSTOGRAM;  Surgeon: Alexis Frock, MD;  [facility];  Service: Urology;  Laterality: N/A;  . CYSTOSCOPY W/ RETROGRADES Right 11/21/2015   Procedure: CYSTOSCOPY WITH RETROGRADE PYELOGRAM;  Surgeon: Alexis Frock, MD;  [facility];  Service: Urology;  Laterality: Right;  . CYSTOSCOPY W/ URETERAL STENT PLACEMENT Right 11/21/2015   Procedure: CYSTOSCOPY WITH STENT REPLACEMENT;  Surgeon: Alexis Frock, MD;  [facility];  Service: Urology;  Laterality: Right;  . CYSTOSCOPY W/ URETERAL STENT PLACEMENT Right 03/10/2016   Procedure: CYSTOSCOPY WITH STENT REPLACEMENT;  Surgeon: Alexis Frock, MD;  Location[facility];  Service: Urology;  Laterality: Right;  .  CYSTOSCOPY W/ URETERAL STENT PLACEMENT Right 06/30/2016   Procedure: CYSTOSCOPY WITH RETROGRADE PYELOGRAM/URETERAL STENT EXCHANGE;  Surgeon: Alexis Frock, MD;  [facility]: [facility];  Service: Urology;  Laterality: Right;  . CYSTOSCOPY W/ URETERAL STENT PLACEMENT N/A 06/01/2017   Procedure: CYSTOSCOPY WITH EXAM UNDER ANESTHESIA;  Surgeon: Alexis Frock, MD;  [facility];  Service: Urology;  Laterality: N/A;  . CYSTOSCOPY WITH RETROGRADE PYELOGRAM, URETEROSCOPY AND STENT PLACEMENT Right 03/20/2015   Procedure: CYSTOSCOPY WITH RETROGRADE PYELOGRAM, URETEROSCOPY WITH BALLOON DILATION AND STENT PLACEMENT ON RIGHT;  Surgeon: Alexis Frock, MD;  Location: [facility];  Service: Urology;  Laterality: Right;  . CYSTOSCOPY WITH RETROGRADE PYELOGRAM, URETEROSCOPY AND STENT PLACEMENT Right 05/02/2015   Procedure: CYSTOSCOPY WITH RIGHT RETROGRADE PYELOGRAM,  DIAGNOSTIC URETEROSCOPY AND STENT PULL ;  Surgeon: Alexis Frock, MD;  Location: [facility];  Service: Urology;  Laterality: Right;  . CYSTOSCOPY WITH RETROGRADE PYELOGRAM, URETEROSCOPY AND STENT PLACEMENT Right 09/05/2015   Procedure: CYSTOSCOPY WITH RETROGRADE PYELOGRAM,  AND STENT PLACEMENT;  Surgeon: Alexis Frock, MD;  [facility];  Service: Urology;  Laterality: Right;  . CYSTOSCOPY WITH RETROGRADE PYELOGRAM, URETEROSCOPY AND STENT PLACEMENT Right 04/01/2017   Procedure: CYSTOSCOPY WITH RETROGRADE PYELOGRAM, URETEROSCOPY AND STENT PLACEMENT;  Surgeon: Alexis Frock, MD;  [facility];  Service: Urology;  Laterality: Right;  . CYSTOSCOPY WITH STENT PLACEMENT Right 10/27/2016   Procedure: CYSTOSCOPY WITH STENT CHANGE and right retrograde pyelogram;  Surgeon: Alexis Frock, MD;  [facility];  Service: Urology;  Laterality: Right;  . EUS N/A 10/02/2014   Procedure: LOWER ENDOSCOPIC ULTRASOUND (EUS);  Surgeon: Arta Silence, MD;  Location: [facility];  Service:  Endoscopy;  Laterality: N/A;  . EXCISION SOFT TISSUE MASS RIGHT FOREMAN  12-08-2006  . EYE SURGERY  as child   pytosis of eyelids repair  . INCISION AND DRAINAGE OF WOUND Bilateral 12/26/2015   Procedure: DEBRIDEMENT OF BILATERAL MASTECTOMY FLAPS;  Surgeon: Irene Limbo, MD;  Location: [place];  Service: Plastics;  Laterality: Bilateral;  . IR CV LINE INJECTION  05/31/2017  . IR FLUORO GUIDE CV LINE LEFT  05/31/2017  . IR FLUORO GUIDE CV LINE RIGHT  04/06/2017  . IR FLUORO GUIDE CV MIDLINE PICC RIGHT  05/30/2017  . IR RADIOLOGIST EVAL & MGMT  05/03/2017  . IR US GUIDE VASC ACCESS LEFT  05/31/2017  . IR US GUIDE VASC ACCESS RIGHT  04/06/2017  . IR US GUIDE VASC ACCESS RIGHT  05/30/2017  . LAPAROSCOPIC CHOLECYSTECTOMY  1990  . LIPOSUCTION WITH LIPOFILLING Bilateral 04/16/2016   Procedure: LIPOSUCTION WITH LIPOFILLING TO BILATERAL CHEST;  Surgeon: Irene Limbo, MD;  Location: [place];  Service: Plastics;  Laterality: Bilateral;  . MASTECTOMY Right 12/11/2015   PROPHYLACTIC   . MASTECTOMY COMPLETE / SIMPLE W/ SENTINEL NODE BIOPSY Left 12/11/2015   AXILLARY SENTINEL LYMPH NODE BIOPSY   . MASTECTOMY W/ SENTINEL NODE BIOPSY Bilateral 12/11/2015   Procedure: RIGHT PROPHYLACTIC MASTECTOMY, LEFT TOTAL MASTECTOMY WITH LEFT AXILLARY SENTINEL LYMPH NODE BIOPSY;  Surgeon: Stark Klein, MD;  Location: [place];  Service: General;  Laterality: Bilateral;  . OSTOMY N/A 11/19/2014   Procedure: OSTOMY;  Surgeon: Michael [name], MD;  [facility];  Service: General;  Laterality: N/A;  . PROCTOSCOPY N/A 04/01/2017   Procedure: RIDGE PROCTOSCOPY;  Surgeon: Michael [name], MD;  [facility];  Service: General;  Laterality: N/A;  . REMOVAL OF BILATERAL TISSUE EXPANDERS WITH PLACEMENT OF BILATERAL BREAST IMPLANTS Bilateral 04/16/2016   Procedure: REMOVAL OF BILATERAL TISSUE EXPANDERS WITH PLACEMENT OF BILATERAL BREAST IMPLANTS;  Surgeon: Irene Limbo, MD;  Location[facility];  Service: Clinical cytogeneticist;  Laterality: Bilateral;  . ROBOTIC ASSISTED LAP VAGINAL HYSTERECTOMY N/A 11/19/2014   Procedure: ROBOTIC LYSIS OF ADHESIONS, CONVERTED TO LAPAROTOMY RADICAL UPPER VAGINECTOMY,LOW ANTERIOR BOWEL RESECTION, COLOSTOMY, BILATERAL URETERAL STENT PLACEMENT AND CYSTONOMY CLOSURE;  Surgeon: Everitt Amber, MD;  [facility];  Service: Gynecology;  Laterality: N/A;  . TISSUE EXPANDER FILLING Bilateral 12/26/2015   Procedure: EXPANSION OF BILATERAL CHEST TISSUE EXPANDERS (60 mL- Right; 75 mL- Left);  Surgeon: Irene Limbo, MD;  Location: [place];  Service: Plastics;  Laterality: Bilateral;  . TONSILLECTOMY    . TOTAL ABDOMINAL HYSTERECTOMY  March 2006   [facility]   and Bilateral Salpingoophorectomy/  staging for Ovarian cancer/  an  . XI ROBOTIC ASSISTED LOWER ANTERIOR RESECTION N/A 04/01/2017   Procedure: XI ROBOTIC VS LAPAROSCOPIC COLOSTOMY TAKEDOWN WITH LYSIS OF ADHESIONS.;  Surgeon: Michael [name], MD;  [facility];  Service: General;  Laterality: N/A;  ERAS PATHWAY    Family History  Problem Relation Age of Onset  . Cancer Mother 14       stomach ca  . Hypertension Mother   . Cancer Father 34       prostate ca  . Diabetes Father   . Heart disease Father        CABG  . Breast cancer Maternal Aunt        dx in her 24s  . Lymphoma Paternal Aunt   . Brain cancer Paternal Grandfather   . Ovarian cancer Other   . Diabetes Sister   . Hypertension Brother y-10  . Heart disease Brother        CABG  . Diabetes Brother     Social History   Socioeconomic History  . Marital status: Married    Spouse name: Not on file  . Number of children: 1  . Years of education: Not on file  . Highest education level: Not on file  Social Needs  . Financial resource strain: Not on file  . Food insecurity - worry: Not on file  . Food insecurity - inability: Not on file  . Transportation needs - medical: Not on file  . Transportation needs -  non-medical: Not on file  Occupational History  . Occupation: retired Therapist, sports from [employer]  . Smoking status: Never Smoker  . Smokeless tobacco: Never Used  Substance and Sexual Activity  . Alcohol use: Yes    Comment: rare social  . Drug use: No  . Sexual activity: Not Currently  Other Topics Concern  . Not on file  Social History Narrative   Exercise-- has not gotten back into it since cancer came back   CBC    Component Value Date/Time   WBC 4.2 06/11/2017 1643   RBC 3.98 06/11/2017 1643   HGB 11.5 (L) 06/11/2017 1643   HGB 12.5 03/18/2017 1415   HCT 34.5 (L) 06/11/2017 1643   HCT 38.2 03/18/2017 1415   PLT 255 06/11/2017 1643   PLT 228 03/18/2017 1415   MCV 86.7 06/11/2017 1643   MCV 87.6 03/18/2017 1415   MCH 28.9 06/11/2017 1643   MCHC 33.3 06/11/2017 1643   RDW 18.1 (H) 06/11/2017 1643   RDW 15.2 (H) 03/18/2017 1415   LYMPHSABS 0.7 06/11/2017 1643   LYMPHSABS 0.9 03/18/2017 1415   MONOABS 1.0 06/11/2017 1643   MONOABS 0.5 03/18/2017 1415   EOSABS 0.2 06/11/2017 1643   EOSABS 0.3 03/18/2017 1415   BASOSABS 0.0 06/11/2017 1643   BASOSABS 0.0  03/18/2017 1415     Physical Exam: Blood pressure 118/72, pulse (!) 105, temperature 97.8 F (36.6 C), temperature source Oral, resp. rate 18, height _0  (1.6 m), weight 196 lb 11.2 oz (89.2 kg), SpO2 100 %. General: Well dressed, well nourished in no apparent distress.   HEENT:  Normocephalic and atraumatic, no lesions.  Extraocular muscles intact. Sclerae anicteric. Pupils equal, round, reactive. No mouth sores or ulcers. Thyroid is normal size, not nodular, midline. Skin:  No lesions or rashes. Breasts:  deferred Lungs:  Clear to auscultation bilaterally.  No wheezes. Cardiovascular:  Regular rate and rhythm.  No murmurs or rubs. Abdomen:   Soft, somewhat tender, nondistended.  No palpable masses.  No hepatosplenomegaly.  No ascites. Normal bowel sounds.  No hernias.  Incision is healed. Ostomy appliance in  situ.  Genitourinary: vaginal cuff intact without blood or fluid in the vault. Improved length and elasticity. Normal mucosa.No palpable masses in pelvis. Rectal exam: stump in tact, no masses Extremities: No cyanosis, clubbing or edema.  No calf tenderness or erythema. No palpable cords. Psychiatric: Mood and affect are appropriate. Neurological: Awake, alert and oriented x 3. Sensation is intact, no neuropathy.  Musculoskeletal: No pain, normal strength and range of motion.  Donaciano Eva, MD  CC: Dr Tresa Moore, Dr Alvy Bimler, Dr Theressa Millard, Dr Madelon Lips

## 2017-07-31 DIAGNOSIS — Z933 Colostomy status: Secondary | ICD-10-CM | POA: Diagnosis not present

## 2017-08-03 ENCOUNTER — Encounter: Payer: BLUE CROSS/BLUE SHIELD | Admitting: Physical Therapy

## 2017-08-04 ENCOUNTER — Ambulatory Visit: Payer: BLUE CROSS/BLUE SHIELD | Attending: Surgery | Admitting: Physical Therapy

## 2017-08-04 ENCOUNTER — Other Ambulatory Visit: Payer: Self-pay | Admitting: Urology

## 2017-08-04 ENCOUNTER — Encounter: Payer: Self-pay | Admitting: Physical Therapy

## 2017-08-04 DIAGNOSIS — M62838 Other muscle spasm: Secondary | ICD-10-CM | POA: Diagnosis not present

## 2017-08-04 DIAGNOSIS — M6281 Muscle weakness (generalized): Secondary | ICD-10-CM | POA: Insufficient documentation

## 2017-08-04 NOTE — Patient Instructions (Signed)
  Balloon Breath    Place hands LIGHTLY on belly below navel. Imagine a balloon inside belly and causing hips and buttocks to expand all the way into the anus. Blow up balloon on breath IN, deflate balloon on breath OUT. Contract abdominals and anus slightly to assist breath OUT. Time __5_ minutes, 2x/day  Copyright  VHI. All rights reserved.     Lying down with both knees bent, let them fall together and to get a stretch in the hips - hold 30 sec and repeat 3x, do daily   ICE: Use ice or cold compress to anus, can use a condom filled with water and freeze it  [name] [address], [place], [place] [address] Phone # [phone] Fax [phone]

## 2017-08-04 NOTE — Therapy (Addendum)
[facility] [name], [place], [place], [address] Phone: [phone]   Fax:  [phone]  Physical Therapy Treatment  Patient Details  Name: Shayleigh Bouldin MRN: [id number] Date of Birth: 05-Aug-1955 Referring Provider: Michael [name]   Encounter Date: 08/04/2017  PT End of Session - 08/04/17 1156    Visit Number  3    Date for PT Re-Evaluation  11/02/17    Authorization Type  [employer]    PT Start Time  1150    PT Stop Time  1230    PT Time Calculation (min)  40 min    Activity Tolerance  Treatment limited secondary to medical complications (Comment)    Behavior During Therapy  [facility] for tasks assessed/performed       Past Medical History:  Diagnosis Date  . Acute bacterial bronchitis 06/04/2015  . Anemia in neoplastic disease   . Benign essential HTN   . Breast cancer of upper-inner quadrant of left female breast ([place])   . 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 Cancer of corpus uteri, except isthmus [facility]) dx 10-15-2004 oncologist-- dr Denman [name] and dr Alvy Bimler     dx endometrial and ovarian cancer s/p  chemotheapy and surgery:  recurrent 11-19-2014 w/ radiation 01-29-2015 to 03-10-2015  . Cellulitis of left abdominal wall near colostomy 12/03/2014  . Chronic idiopathic neutropenia (HCC)    presumed related to chemotherapy March 2006--- followed by dr Alvy Bimler   . Complication of anesthesia    PONV  . Diabetic retinopathy, background ([place])   . DM type 2 (diabetes mellitus, type 2) ([place])    monitored by dr Legrand Como altheimer  . Dysuria   . GERD (gastroesophageal reflux disease)    takes omeprazole  . Hiatal hernia   . History of bronchitis   . History of gastric polyp    2014  duodenum  . History of radiation therapy    01-29-2015 to  03-10-2015  pelvis 50.4Gy  . Hypothyroidism    monitored by dr Legrand Como altheimer  . Malignant neoplasm of corpus uteri, except isthmus ([place]) 05/10/2012  . Malignant neoplasm of ovary ([place]) 05/10/2012  . Mixed dyslipidemia   . Multiple thyroid nodules    Managed by Dr. Harlow Asa  . PONV (postoperative nausea and vomiting)    "scopolamine patch works for me"  . Radiation-induced dermatitis    contact dermatitis , radiation completed, rash only on ankles now.  . Reactive thrombocytosis 11/29/2014  . Right flank pain   . S/P colostomy ([place])   . Seasonal allergies   . Shoulder stiffness    bilateral   . Ureteral stricture, right UROLOGIT-  DR [facility]   CHRONIC--  TREATMENT URETERAL STENT  . Vitamin D deficiency   . Wears glasses     Past Surgical History:  Procedure Laterality Date  . APPENDECTOMY    . BREAST RECONSTRUCTION WITH PLACEMENT OF TISSUE EXPANDER AND FLEX HD (ACELLULAR HYDRATED DERMIS) Bilateral 12/11/2015   Procedure: BILATERAL BREAST RECONSTRUCTION WITH PLACEMENT OF TISSUE EXPANDERS;  Surgeon: Irene Limbo, MD;  Location: [facility];  Service: Plastics;  Laterality: Bilateral;  . COLONOSCOPY WITH PROPOFOL N/A 08/21/2013   Procedure: COLONOSCOPY WITH PROPOFOL;  Surgeon: Cleotis Nipper, MD;  [facility];  Service: Endoscopy;  Laterality: N/A;  . COLOSTOMY TAKEDOWN N/A 12/04/2014  Procedure: LAPROSCOPIC LYSIS OF ADHESIONS, SPLENIC MOBILIZATION, RELOCATION OF COLOSTOMY, DEBRIDEMENT INITIAL COLOSTOMY SITE;  Surgeon: Michael [name], MD;  [facility];  Service: General;  Laterality: N/A;  . CYSTOGRAM N/A 06/01/2017   Procedure: CYSTOGRAM;  Surgeon: Alexis Frock, MD;  [facility];  Service: Urology;  Laterality: N/A;  . CYSTOSCOPY W/ RETROGRADES Right 11/21/2015   Procedure: CYSTOSCOPY WITH RETROGRADE PYELOGRAM;  Surgeon: Alexis Frock, MD;  [facility];  Service: Urology;  Laterality: Right;  . CYSTOSCOPY W/ URETERAL STENT PLACEMENT Right 11/21/2015   Procedure:  CYSTOSCOPY WITH STENT REPLACEMENT;  Surgeon: Alexis Frock, MD;  [facility];  Service: Urology;  Laterality: Right;  . CYSTOSCOPY W/ URETERAL STENT PLACEMENT Right 03/10/2016   Procedure: CYSTOSCOPY WITH STENT REPLACEMENT;  Surgeon: Alexis Frock, MD;  Location: [facility];  Service: Urology;  Laterality: Right;  . CYSTOSCOPY W/ URETERAL STENT PLACEMENT Right 06/30/2016   Procedure: CYSTOSCOPY WITH RETROGRADE PYELOGRAM/URETERAL STENT EXCHANGE;  Surgeon: Alexis Frock, MD;  Location: [facility];  Service: Urology;  Laterality: Right;  . CYSTOSCOPY W/ URETERAL STENT PLACEMENT N/A 06/01/2017   Procedure: CYSTOSCOPY WITH EXAM UNDER ANESTHESIA;  Surgeon: Alexis Frock, MD;  [facility];  Service: Urology;  Laterality: N/A;  . CYSTOSCOPY WITH RETROGRADE PYELOGRAM, URETEROSCOPY AND STENT PLACEMENT Right 03/20/2015   Procedure: CYSTOSCOPY WITH RETROGRADE PYELOGRAM, URETEROSCOPY WITH BALLOON DILATION AND STENT PLACEMENT ON RIGHT;  Surgeon: Alexis Frock, MD;  Location[facility];  Service: Urology;  Laterality: Right;  . CYSTOSCOPY WITH RETROGRADE PYELOGRAM, URETEROSCOPY AND STENT PLACEMENT Right 05/02/2015   Procedure: CYSTOSCOPY WITH RIGHT RETROGRADE PYELOGRAM,  DIAGNOSTIC URETEROSCOPY AND STENT PULL ;  Surgeon: Alexis Frock, MD;  Location: [facility];  Service: Urology;  Laterality: Right;  . CYSTOSCOPY WITH RETROGRADE PYELOGRAM, URETEROSCOPY AND STENT PLACEMENT Right 09/05/2015   Procedure: CYSTOSCOPY WITH RETROGRADE PYELOGRAM,  AND STENT PLACEMENT;  Surgeon: Alexis Frock, MD;  [facility];  Service: Urology;  Laterality: Right;  . CYSTOSCOPY WITH RETROGRADE PYELOGRAM, URETEROSCOPY AND STENT PLACEMENT Right 04/01/2017   Procedure: CYSTOSCOPY WITH RETROGRADE PYELOGRAM, URETEROSCOPY AND STENT PLACEMENT;  Surgeon: Alexis Frock, MD;  [facility];  Service: Urology;  Laterality: Right;  . CYSTOSCOPY WITH STENT PLACEMENT  Right 10/27/2016   Procedure: CYSTOSCOPY WITH STENT CHANGE and right retrograde pyelogram;  Surgeon: Alexis Frock, MD;  Location: [facility];  Service: Urology;  Laterality: Right;  . EUS N/A 10/02/2014   Procedure: LOWER ENDOSCOPIC ULTRASOUND (EUS);  Surgeon: Arta Silence, MD;  Location[facility];  Service: Endoscopy;  Laterality: N/A;  . EXCISION SOFT TISSUE MASS RIGHT FOREMAN  12-08-2006  . EYE SURGERY  as child   pytosis of eyelids repair  . INCISION AND DRAINAGE OF WOUND Bilateral 12/26/2015   Procedure: DEBRIDEMENT OF BILATERAL MASTECTOMY FLAPS;  Surgeon: Irene Limbo, MD;  Location: [place] [facility];  Service: Plastics;  Laterality: Bilateral;  . IR CV LINE INJECTION  05/31/2017  . IR FLUORO GUIDE CV LINE LEFT  05/31/2017  . IR FLUORO GUIDE CV LINE RIGHT  04/06/2017  . IR FLUORO GUIDE CV MIDLINE PICC RIGHT  05/30/2017  . IR RADIOLOGIST EVAL & MGMT  05/03/2017  . IR US GUIDE VASC ACCESS LEFT  05/31/2017  . IR US GUIDE VASC ACCESS RIGHT  04/06/2017  . IR US GUIDE VASC ACCESS RIGHT  05/30/2017  . LAPAROSCOPIC CHOLECYSTECTOMY  1990  . LIPOSUCTION WITH LIPOFILLING Bilateral 04/16/2016   Procedure: LIPOSUCTION WITH LIPOFILLING TO BILATERAL CHEST;  Surgeon: Irene Limbo, MD;  Location: [facility];  Service: Plastics;  Laterality: Bilateral;  . MASTECTOMY Right 12/11/2015   PROPHYLACTIC   . MASTECTOMY COMPLETE / SIMPLE W/ SENTINEL NODE BIOPSY Left 12/11/2015   AXILLARY SENTINEL LYMPH NODE BIOPSY   . MASTECTOMY W/ SENTINEL NODE BIOPSY Bilateral 12/11/2015   Procedure: RIGHT PROPHYLACTIC MASTECTOMY, LEFT TOTAL MASTECTOMY WITH LEFT AXILLARY SENTINEL LYMPH NODE BIOPSY;  Surgeon: Stark Klein, MD;  Location: [facility];  Service: General;  Laterality: Bilateral;  . OSTOMY N/A 11/19/2014   Procedure: OSTOMY;  Surgeon: Michael [name], MD;  [facility];  Service: General;  Laterality: N/A;  . PROCTOSCOPY N/A 04/01/2017   Procedure: RIDGE PROCTOSCOPY;   Surgeon: Michael [name], MD;  [facility];  Service: General;  Laterality: N/A;  . REMOVAL OF BILATERAL TISSUE EXPANDERS WITH PLACEMENT OF BILATERAL BREAST IMPLANTS Bilateral 04/16/2016   Procedure: REMOVAL OF BILATERAL TISSUE EXPANDERS WITH PLACEMENT OF BILATERAL BREAST IMPLANTS;  Surgeon: Irene Limbo, MD;  Location: [place];  Service: Plastics;  Laterality: Bilateral;  . ROBOTIC ASSISTED LAP VAGINAL HYSTERECTOMY N/A 11/19/2014   Procedure: ROBOTIC LYSIS OF ADHESIONS, CONVERTED TO LAPAROTOMY RADICAL UPPER VAGINECTOMY,LOW ANTERIOR BOWEL RESECTION, COLOSTOMY, BILATERAL URETERAL STENT PLACEMENT AND CYSTONOMY CLOSURE;  Surgeon: Everitt Amber, MD;  [facility];  Service: Gynecology;  Laterality: N/A;  . TISSUE EXPANDER FILLING Bilateral 12/26/2015   Procedure: EXPANSION OF BILATERAL CHEST TISSUE EXPANDERS (60 mL- Right; 75 mL- Left);  Surgeon: Irene Limbo, MD;  Location: [place];  Service: Plastics;  Laterality: Bilateral;  . TONSILLECTOMY    . TOTAL ABDOMINAL HYSTERECTOMY  March 2006   [facility]   and Bilateral Salpingoophorectomy/  staging for Ovarian cancer/  an  . XI ROBOTIC ASSISTED LOWER ANTERIOR RESECTION N/A 04/01/2017   Procedure: XI ROBOTIC VS LAPAROSCOPIC COLOSTOMY TAKEDOWN WITH LYSIS OF ADHESIONS.;  Surgeon: Michael [name], MD;  [facility];  Service: General;  Laterality: N/A;  ERAS PATHWAY    There were no vitals filed for this visit.  Subjective Assessment - 08/04/17 1155    Subjective  I am really sore and have been for the last week.     Pertinent History  part of rectum removed    Patient Stated Goals  be able to have surgery for ostomy reversal    Currently in Pain?  No/denies                      [facility] Adult PT Treatment/Exercise - 08/04/17 0001      Neuro Re-ed    Neuro Re-ed Details   breathing and contract relax of pelvic floor using breathing      Knee/Hip Exercises: Stretches   Other Knee/Hip Stretches   internal hip rotation in supine      Manual Therapy   Manual Therapy  Internal Pelvic Floor;Myofascial release    Manual therapy comments  pt informed and consent given to perform internal soft tissue mobs    Internal Pelvic Floor  rectal STM to anal sphincters and levators             PT Education - 08/04/17 1235    Education provided  Yes    Education Details  hip IR stretch, balloon breathing, ice    Person(s) Educated  Patient    Methods  Explanation;Demonstration;Verbal cues;Handout    Comprehension  Verbalized understanding;Returned demonstration       PT Short Term Goals - 08/04/17 1346  PT SHORT TERM GOAL #1   Title  pt will have reduced redness around anal sphincter due to using appropriate skin care products    Baseline  small wound on right gluteal bottock at start of gluteal fold about the size of pencil eraser    Time  4    Period  Weeks    Status  On-going      PT SHORT TERM GOAL #2   Title  pt will be ind with initial HEP    Baseline  issued today    Time  4    Period  Weeks    Status  On-going      PT SHORT TERM GOAL #3   Title  pt will tolerate one finger past the internal anal sphincter due to improved soft tissue length and function    Baseline  able to tolerate soft tissue release past anal sphincter muscles, very tender    Time  4    Period  Weeks    Status  Achieved        PT Long Term Goals - 07/13/17 1754      PT LONG TERM GOAL #1   Title  Pt will be ind with advanced HEP    Time  16    Period  Weeks    Status  New    Target Date  11/02/17      PT LONG TERM GOAL #2   Title  pt will be able to bulge pelvic floor in order to correctly excrete stool after ileostomy reversal    Time  16    Period  Weeks    Status  New    Target Date  11/02/17      PT LONG TERM GOAL #3   Title  pt will tolerate internal soft tissue palpation due to decreased soft tissue retstriction    Time  16    Period  Weeks    Status  New    Target Date   11/02/17      PT LONG TERM GOAL #4   Title  pt will be able to perform 3/5 MMT rectally and hold for 5 seconds for improved control of BM when ileostomy is reversed    Time  16    Period  Weeks    Status  New    Target Date  11/02/17            Plan - 08/04/17 1349    Clinical Impression Statement  Patient has met goal for tolerating soft tissue release.  Pt is still very raw and tender. She was limited today due to feeling sick and nausea that she has had for the last two weeks.  Pt was able to demonstrate improved muscle contraction after manual STM and using breathing technique for relax and contract. Pt will benefit from skilled PT to work on improved strength of pelvic floor for greater success of surgical outcomes.    Clinical Impairments Affecting Rehab Potential  history of breast cancer and endometrial cancer removal, total hysterectomy, necrosis of ileostomy, bladder recontruction surgery    PT Treatment/Interventions  ADLs/Self Care Home Management;Biofeedback;Cryotherapy;Electrical Stimulation;Moist Heat;Ultrasound;Gait training;Stair training;Functional mobility training;Therapeutic activities;Therapeutic exercise;Balance training;Neuromuscular re-education;Patient/family education;Scar mobilization;Passive range of motion;Dry needling;Taping;Manual techniques    PT Next Visit Plan  soft tissue release to anal sphincter muscles, hip ROM and review stretches, EMG biofeedback if there is improved skin integrity, abdominal scar massage    Consulted and Agree with Plan of Care  Patient       Patient will benefit from skilled therapeutic intervention in order to improve the following deficits and impairments:  Decreased scar mobility, Decreased skin integrity, Impaired tone, Increased muscle spasms, Decreased strength  Visit Diagnosis: Muscle weakness (generalized)  Other muscle spasm     Problem List Patient Active Problem List   Diagnosis Date Noted  . 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 Ileus ([place]) 04/18/2017  . Protein-calorie malnutrition, moderate ([place]) 04/18/2017  . Pelvic abscess in female 04/16/2017  . Postoperative anemia 04/04/2017  . 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 Hot flashes related to aromatase inhibitor therapy 03/18/2017  . Ureteral stricture, right, s/p resection & bladder hitch reimplantation 04/01/2017 09/10/2016  . Vitamin D insufficiency 08/30/2016  . Breast cancer of upper-inner quadrant of left female breast ([place])   . Pelvic pain in female 03/19/2015  . Chronic anemia 12/30/2014  . UTI (urinary tract infection)   . Pancytopenia, acquired ([place]) 11/26/2014  . Morbid obesity ([place]) 11/20/2014  . 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 Abdominal pain 09/09/2014  . Postmenopausal bleeding 09/05/2014  . History of ovarian & endometrial cancer 07/17/2012  . Chronic neutropenia ([place]) 12/14/2011  . Primary hypothyroidism 12/14/2011  . DM type 2 (diabetes mellitus, type 2) ([place]) 12/14/2011  . Benign essential HTN 12/14/2011    Zannie Cove, PT 08/04/2017, 1:55 PM  [facility] [address], [place], [place], [address] Phone: [phone]   Fax:  [phone]  Name: Glendora Clouatre MRN: [id number] Date of Birth: [birth year]  PHYSICAL THERAPY DISCHARGE SUMMARY  Visits from Start of Care: 3  Current functional level related to goals / functional outcomes: See above, pt going to surgery   Remaining deficits: See above   Education / Equipment: HEP  Plan: Patient agrees to discharge.  Patient goals were not met. Patient is being discharged due to a change in medical status.  ?????         [place], PT 08/10/17 2:00 PM

## 2017-08-05 ENCOUNTER — Encounter (HOSPITAL_BASED_OUTPATIENT_CLINIC_OR_DEPARTMENT_OTHER): Payer: Self-pay

## 2017-08-05 ENCOUNTER — Other Ambulatory Visit: Payer: Self-pay

## 2017-08-05 NOTE — Progress Notes (Signed)
SPOKE W/ PT VIA PHONE FOR PRE-OP INTERVIEW.  NPO AFTER MN W/ EXCEPTION CLEAR LIQUIDS UNTIL 0700 (NO CREAM/ MILK PRODUCTS). PT HAS DEHYDRATION ISSUES DUE TO HIGH ILEOSTOMY OUTPUT, NEEDS TO STAY HYDRATED FOR IV STICK SINCE SHE IS DIFFICULT STICK.  ARRIVE AT 1200.  NEEDS ISTAT 8.  CURRENT EKG IN CHART AND Epic.  WILL TAKE SYNTHROID, CLARITIN, ARIMADEX, AND IF NEEDED TAKE NORCO/ NAUSEA RX.

## 2017-08-09 DIAGNOSIS — Z933 Colostomy status: Secondary | ICD-10-CM | POA: Diagnosis not present

## 2017-08-09 DIAGNOSIS — L89319 Pressure ulcer of right buttock, unspecified stage: Secondary | ICD-10-CM | POA: Diagnosis not present

## 2017-08-10 ENCOUNTER — Encounter: Payer: Self-pay | Admitting: Family Medicine

## 2017-08-10 ENCOUNTER — Ambulatory Visit: Payer: BLUE CROSS/BLUE SHIELD | Admitting: Family Medicine

## 2017-08-10 VITALS — BP 120/78 | HR 101 | Temp 97.4°F | Ht 63.0 in | Wt 189.0 lb

## 2017-08-10 DIAGNOSIS — R110 Nausea: Secondary | ICD-10-CM

## 2017-08-10 DIAGNOSIS — S31819A Unspecified open wound of right buttock, initial encounter: Secondary | ICD-10-CM

## 2017-08-10 MED ORDER — PROMETHAZINE HCL 25 MG/ML IJ SOLN
25.0000 mg | Freq: Once | INTRAMUSCULAR | Status: AC
  Administered 2017-08-10: 25 mg via INTRAMUSCULAR

## 2017-08-10 MED ORDER — SILVER SULFADIAZINE 1 % EX CREA: 1.0000 "application " | TOPICAL_CREAM | Freq: Every day | CUTANEOUS | 0 refills | Status: DC

## 2017-08-10 MED ORDER — HYDROCODONE-ACETAMINOPHEN 5-325 MG PO TABS: 1.0000 | ORAL_TABLET | Freq: Four times a day (QID) | ORAL | 0 refills | Status: DC | PRN

## 2017-08-10 NOTE — Progress Notes (Signed)
Chief Complaint  Patient presents with  . Wound Infection    rectal area    Shetara Launer is a 62 y.o. female here for a skin complaint.  Duration: 6 weeks Location: bottom Pruritic? No Painful? Yes  Drainage? No Other associated symptoms: None Therapies tried thus far: Aloe with lidocaine, hygiene  ROS:  Const: No fevers Skin: As noted in HPI  Past Medical History:  Diagnosis Date  . Adrenal adenoma, left 02/08/2016   CT: stable benign  . Anemia in neoplastic disease   . Benign essential HTN   . 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 Cancer of corpus uteri, except isthmus [facility])  oncologist-- dr Denman [name] and dr Alvy Bimler    10-15-2004  dx endometroid endometrial and ovarian cancer s/p  chemotheapy and surgery(TAH w/ BSO) :  recurrent 11-19-2014 post pelvic surgery and radiation 01-29-2015 to 03-10-2015  . Chronic idiopathic neutropenia (HCC)    presumed related to chemotherapy March 2006--- followed by dr Alvy Bimler (treatment w/ G-CSF injections  . Chronic nausea   . Chronic pain    perineal/ anal  area from bladder pad irritates skin , right flank pain  . CKD stage G2/A3, GFR 60-89 and albumin creatinine ratio >300 mg/g    nephrologist-  dr Madelon Lips  . Diabetic retinopathy, background ([place])   . Difficult intravenous access    small veins--- hx PICC lines  . DM type 2 (diabetes mellitus, type 2) ([place])    monitored by dr Legrand Como altheimer  . Dysuria   . Environmental and seasonal allergies   . Fatty liver 02/08/2016   CT  . Generalized muscle weakness   . GERD (gastroesophageal reflux disease)   . Hiatal hernia   . History of abdominal abscess 04/16/2017   post surgery 04-01-2017  --- resolved 10/ 2018  . History of gastric polyp    2014  duodenum  . History of ileus 04/16/2017     resolved w/ no surgical intervention  . History of radiation therapy    01-29-2015 to 03-10-2015  pelvis 50.4Gy  . Hypothyroidism    monitored by dr Legrand Como altheimer  . Ileostomy in place [facility]) 04/01/2017   created at same time colostomy takedown.  . Lower urinary tract symptoms (LUTS)    urge urinary  incontinence  . Mixed dyslipidemia   . Multiple thyroid nodules    Managed by Dr. Harlow Asa  . PONV (postoperative nausea and vomiting)    "scopolamine patch works for me"  . Radiation-induced dermatitis    contact dermatitis , radiation completed, rash only on ankles now.  . Seasonal allergies   . Ureteral stricture, right UROLOGIT-  DR [facility]   CHRONIC--  TREATMENT URETERAL STENT  . Vitamin D deficiency   . Wears glasses    Allergies  Allergen Reactions  . Penicillins Swelling    Facial swelling/childhood allergy Has patient had a PCN reaction causing immediate rash, facial/tongue/throat swelling, SOB or lightheadedness with hypotension: Yes Has patient had a PCN reaction causing severe rash involving mucus membranes or skin necrosis: Yes Has patient had a PCN reaction that required hospitalization yes Has patient had a PCN reaction occurring within the last 10 years: No If all of the above answers are "NO", then may proceed with Cephalosporin use.   [name] Kitchen Ultram [Tramadol] Hives  . Adhesive [  Tape]     blisters  . Cefaclor Rash    Ceclor  . Erythromycin     Gastritis, abd cramps  . Trimethoprim Rash  . Ciprofloxacin Other (See Comments)    Unknown On Dr notes   . Fluconazole Rash  . Oxycodone     " I just feel weird"  . Pectin Rash    Pectin ring for stoma  . Sulfa Antibiotics Rash   Allergies as of 08/10/2017      Reactions   Penicillins Swelling   Facial swelling/childhood allergy Has patient had a PCN reaction causing immediate rash, facial/tongue/throat swelling, SOB or lightheadedness with hypotension: Yes Has patient had a PCN reaction causing severe rash  involving mucus membranes or skin necrosis: Yes Has patient had a PCN reaction that required hospitalization yes Has patient had a PCN reaction occurring within the last 10 years: No If all of the above answers are "NO", then may proceed with Cephalosporin use.   Ultram [tramadol] Hives   Adhesive [tape]    blisters   Cefaclor Rash   Ceclor   Erythromycin    Gastritis, abd cramps   Trimethoprim Rash   Ciprofloxacin Other (See Comments)   Unknown On Dr notes    Fluconazole Rash   Oxycodone    " I just feel weird"   Pectin Rash   Pectin ring for stoma   Sulfa Antibiotics Rash      Medication List        Accurate as of 08/10/17 12:14 PM. Always use your most recent med list.          anastrozole 1 MG tablet Commonly known as:  ARIMIDEX TAKE 1 TABLET DAILY   aspirin 81 MG tablet Take 81 mg by mouth daily.   Biotin 5 MG Tabs Take 5 mg by mouth every morning.   ciprofloxacin 500 MG tablet Commonly known as:  CIPRO Take 500 mg by mouth 2 (two) times daily.   diphenoxylate-atropine 2.5-0.025 MG tablet Commonly known as:  LOMOTIL Take 1-2 tablets by mouth every 8 (eight) hours. TO PREVENT LOOSE BOWEL MOVEMENTS   filgrastim 480 MCG/1.6ML injection Commonly known as:  NEUPOGEN Inject 1.6 ml under the skin every 5 days for life   HYDROcodone-acetaminophen 5-325 MG tablet Commonly known as:  NORCO/VICODIN Take 1 tablet by mouth every 6 (six) hours as needed (Pain).   hydrocortisone cream 1 % Apply 1 application daily as needed topically for itching.   levothyroxine 150 MCG tablet Commonly known as:  SYNTHROID, LEVOTHROID Take 150 mcg by mouth daily before breakfast.   loratadine 10 MG tablet Commonly known as:  CLARITIN Take 10 mg by mouth every morning.   metFORMIN 1000 MG tablet Commonly known as:  GLUCOPHAGE Take 1,000 mg by mouth 2 (two) times daily with a meal.   omega-3 acid ethyl esters 1 g capsule Commonly known as:  LOVAZA Take 1 g by mouth 2 (two)  times daily.   ondansetron 4 MG disintegrating tablet Commonly known as:  ZOFRAN-ODT Take 4 mg by mouth every 6 (six) hours as needed for nausea or vomiting.   PEPCID COMPLETE PO Take 1 tablet by mouth daily as needed (INDIGESTION).   PRENATAL VITAMIN PO Take 1 capsule by mouth daily. Takes prenatal because there are no dyes in it   prochlorperazine 10 MG tablet Commonly known as:  COMPAZINE Take 1 tablet (10 mg total) by mouth 2 (two) times daily as needed for nausea or vomiting.   promethazine 25  MG tablet Commonly known as:  PHENERGAN Take 1 tablet (25 mg total) by mouth every 6 (six) hours as needed for nausea or vomiting.   ranitidine 150 MG tablet Commonly known as:  ZANTAC Take 150 mg by mouth 2 (two) times daily as needed for heartburn.   rosuvastatin 10 MG tablet Commonly known as:  CRESTOR Take 10 mg by mouth every evening.   silver sulfADIAZINE 1 % cream Commonly known as:  SILVADENE Apply 1 application topically daily.   sitaGLIPtin 100 MG tablet Commonly known as:  JANUVIA Take 100 mg by mouth every morning.   SYSTANE OP Place 1 drop into both eyes at bedtime.   Vitamin D3 10000 units capsule Take 10,000 Units by mouth once a week. Sunday evening's       BP 120/78 (BP Location: Right Arm, Patient Position: Sitting, Cuff Size: Normal)   Pulse (!) 101   Temp (!) 97.4 F (36.3 C) (Oral)   Ht _0  (1.6 m)   Wt 189 lb (85.7 kg)   SpO2 96%   BMI 33.48 kg/m  Gen: awake, alert, appearing stated age Lungs: No accessory muscle use Skin: Over R buttock near gluteal cleft, there is an area of erythema and small area of ulceration.  It is tender to palpation, I do not appreciate any fluctuance.  There is no excessive warmth. Psych: Age appropriate judgment and insight  Wound of right buttock, initial encounter - Plan: AMB referral to wound clinic, HYDROcodone-acetaminophen (NORCO/VICODIN) 5-325 MG tablet, silver sulfADIAZINE (SILVADENE) 1 % cream  Nausea  - Plan: promethazine (PHENERGAN) injection 25 mg  Orders as above. Refer to wound care, refill course of Vicodin.  Silvadene to prevent infection. Patient threw up in the exam room, given 25 mg of IM Phenergan. F/u prn. The patient voiced understanding and agreement to the plan.  [place], DO 08/10/17 12:14 PM

## 2017-08-10 NOTE — Patient Instructions (Addendum)
Keep doing best to keep the area clean and dry.   Things to look out for: increasing pain not relieved by ibuprofen/acetaminophen, fevers, spreading redness, drainage of pus, or foul odor.  If you do not hear anything about your referral in the next several days, call our office and ask for an update.

## 2017-08-10 NOTE — Progress Notes (Signed)
Pre visit review using our clinic review tool, if applicable. No additional management support is needed unless otherwise documented below in the visit note. 

## 2017-08-11 ENCOUNTER — Encounter: Payer: BLUE CROSS/BLUE SHIELD | Admitting: Physical Therapy

## 2017-08-12 ENCOUNTER — Telehealth: Payer: Self-pay | Admitting: *Deleted

## 2017-08-12 NOTE — Telephone Encounter (Signed)
Received Plan of Care Update for review only; forwarded to provider/SLS 01/11

## 2017-08-15 DIAGNOSIS — L89319 Pressure ulcer of right buttock, unspecified stage: Secondary | ICD-10-CM | POA: Diagnosis not present

## 2017-08-15 DIAGNOSIS — Z933 Colostomy status: Secondary | ICD-10-CM | POA: Diagnosis not present

## 2017-08-17 ENCOUNTER — Ambulatory Visit (HOSPITAL_BASED_OUTPATIENT_CLINIC_OR_DEPARTMENT_OTHER): Payer: BLUE CROSS/BLUE SHIELD | Admitting: Anesthesiology

## 2017-08-17 ENCOUNTER — Encounter (HOSPITAL_BASED_OUTPATIENT_CLINIC_OR_DEPARTMENT_OTHER)
Admission: RE | Disposition: A | Discharge status: Home / Self Care | Payer: Self-pay | Source: Ambulatory Visit | Attending: Urology

## 2017-08-17 ENCOUNTER — Ambulatory Visit (HOSPITAL_BASED_OUTPATIENT_CLINIC_OR_DEPARTMENT_OTHER)
Admission: RE | Admit: 2017-08-17 | Discharge: 2017-08-17 | Disposition: A | Discharge status: Home / Self Care | Payer: BLUE CROSS/BLUE SHIELD | Source: Ambulatory Visit | Attending: Urology | Admitting: Urology

## 2017-08-17 ENCOUNTER — Encounter (HOSPITAL_BASED_OUTPATIENT_CLINIC_OR_DEPARTMENT_OTHER): Payer: Self-pay | Admitting: *Deleted

## 2017-08-17 DIAGNOSIS — E039 Hypothyroidism, unspecified: Secondary | ICD-10-CM | POA: Insufficient documentation

## 2017-08-17 DIAGNOSIS — Z882 Allergy status to sulfonamides status: Secondary | ICD-10-CM | POA: Insufficient documentation

## 2017-08-17 DIAGNOSIS — Z881 Allergy status to other antibiotic agents status: Secondary | ICD-10-CM | POA: Diagnosis not present

## 2017-08-17 DIAGNOSIS — K760 Fatty (change of) liver, not elsewhere classified: Secondary | ICD-10-CM | POA: Insufficient documentation

## 2017-08-17 DIAGNOSIS — Z7982 Long term (current) use of aspirin: Secondary | ICD-10-CM | POA: Insufficient documentation

## 2017-08-17 DIAGNOSIS — Z853 Personal history of malignant neoplasm of breast: Secondary | ICD-10-CM | POA: Diagnosis not present

## 2017-08-17 DIAGNOSIS — E782 Mixed hyperlipidemia: Secondary | ICD-10-CM | POA: Diagnosis not present

## 2017-08-17 DIAGNOSIS — E1122 Type 2 diabetes mellitus with diabetic chronic kidney disease: Secondary | ICD-10-CM | POA: Insufficient documentation

## 2017-08-17 DIAGNOSIS — Z8543 Personal history of malignant neoplasm of ovary: Secondary | ICD-10-CM | POA: Insufficient documentation

## 2017-08-17 DIAGNOSIS — K219 Gastro-esophageal reflux disease without esophagitis: Secondary | ICD-10-CM | POA: Diagnosis not present

## 2017-08-17 DIAGNOSIS — K449 Diaphragmatic hernia without obstruction or gangrene: Secondary | ICD-10-CM | POA: Diagnosis not present

## 2017-08-17 DIAGNOSIS — Z923 Personal history of irradiation: Secondary | ICD-10-CM | POA: Insufficient documentation

## 2017-08-17 DIAGNOSIS — I129 Hypertensive chronic kidney disease with stage 1 through stage 4 chronic kidney disease, or unspecified chronic kidney disease: Secondary | ICD-10-CM | POA: Diagnosis not present

## 2017-08-17 DIAGNOSIS — E559 Vitamin D deficiency, unspecified: Secondary | ICD-10-CM | POA: Insufficient documentation

## 2017-08-17 DIAGNOSIS — I10 Essential (primary) hypertension: Secondary | ICD-10-CM | POA: Diagnosis not present

## 2017-08-17 DIAGNOSIS — N136 Pyonephrosis: Secondary | ICD-10-CM | POA: Diagnosis not present

## 2017-08-17 DIAGNOSIS — Z466 Encounter for fitting and adjustment of urinary device: Secondary | ICD-10-CM | POA: Diagnosis not present

## 2017-08-17 DIAGNOSIS — D708 Other neutropenia: Secondary | ICD-10-CM | POA: Diagnosis not present

## 2017-08-17 DIAGNOSIS — Z79899 Other long term (current) drug therapy: Secondary | ICD-10-CM | POA: Diagnosis not present

## 2017-08-17 DIAGNOSIS — Z9221 Personal history of antineoplastic chemotherapy: Secondary | ICD-10-CM | POA: Insufficient documentation

## 2017-08-17 DIAGNOSIS — Z888 Allergy status to other drugs, medicaments and biological substances status: Secondary | ICD-10-CM | POA: Diagnosis not present

## 2017-08-17 DIAGNOSIS — E119 Type 2 diabetes mellitus without complications: Secondary | ICD-10-CM | POA: Diagnosis not present

## 2017-08-17 DIAGNOSIS — Z885 Allergy status to narcotic agent status: Secondary | ICD-10-CM | POA: Insufficient documentation

## 2017-08-17 DIAGNOSIS — E669 Obesity, unspecified: Secondary | ICD-10-CM | POA: Insufficient documentation

## 2017-08-17 DIAGNOSIS — N189 Chronic kidney disease, unspecified: Secondary | ICD-10-CM | POA: Diagnosis not present

## 2017-08-17 DIAGNOSIS — Z6831 Body mass index (BMI) 31.0-31.9, adult: Secondary | ICD-10-CM | POA: Diagnosis not present

## 2017-08-17 DIAGNOSIS — N135 Crossing vessel and stricture of ureter without hydronephrosis: Secondary | ICD-10-CM | POA: Insufficient documentation

## 2017-08-17 DIAGNOSIS — Z880 Allergy status to penicillin: Secondary | ICD-10-CM | POA: Insufficient documentation

## 2017-08-17 HISTORY — DX: Other allergic rhinitis: J30.89

## 2017-08-17 HISTORY — DX: Other chronic pain: G89.29

## 2017-08-17 HISTORY — DX: Ileostomy status: Z93.2

## 2017-08-17 HISTORY — DX: Unspecified symptoms and signs involving the genitourinary system: R39.9

## 2017-08-17 HISTORY — DX: Fatty (change of) liver, not elsewhere classified: K76.0

## 2017-08-17 HISTORY — DX: Personal history of other specified conditions: Z87.898

## 2017-08-17 HISTORY — DX: Personal history of other diseases of the digestive system: Z87.19

## 2017-08-17 HISTORY — DX: Chronic kidney disease, stage 2 (mild): N18.2

## 2017-08-17 HISTORY — DX: Nausea: R11.0

## 2017-08-17 HISTORY — DX: Benign neoplasm of left adrenal gland: D35.02

## 2017-08-17 HISTORY — DX: Muscle weakness (generalized): M62.81

## 2017-08-17 HISTORY — PX: CYSTOSCOPY W/ URETERAL STENT PLACEMENT: SHX1429

## 2017-08-17 HISTORY — DX: Other specified health status: Z78.9

## 2017-08-17 LAB — POCT I-STAT, CHEM 8
BUN: 98 mg/dL — ABNORMAL HIGH (ref 6–20)
CREATININE: 3 mg/dL — AB (ref 0.44–1.00)
Calcium, Ion: 1.47 mmol/L — ABNORMAL HIGH (ref 1.15–1.40)
Chloride: 101 mmol/L (ref 101–111)
GLUCOSE: 271 mg/dL — AB (ref 65–99)
HEMATOCRIT: 49 % — AB (ref 36.0–46.0)
HEMOGLOBIN: 16.7 g/dL — AB (ref 12.0–15.0)
Potassium: 4.5 mmol/L (ref 3.5–5.1)
Sodium: 126 mmol/L — ABNORMAL LOW (ref 135–145)
TCO2: 12 mmol/L — ABNORMAL LOW (ref 22–32)

## 2017-08-17 LAB — GLUCOSE, CAPILLARY: GLUCOSE-CAPILLARY: 191 mg/dL — AB (ref 65–99)

## 2017-08-17 SURGERY — CYSTOSCOPY, WITH RETROGRADE PYELOGRAM AND URETERAL STENT INSERTION
Anesthesia: Monitor Anesthesia Care | Laterality: Right

## 2017-08-17 MED ORDER — MIDAZOLAM HCL 2 MG/2ML IJ SOLN
INTRAMUSCULAR | Status: AC
  Filled 2017-08-17: qty 2

## 2017-08-17 MED ORDER — SCOPOLAMINE 1 MG/3DAYS TD PT72
1.0000 | MEDICATED_PATCH | Freq: Once | TRANSDERMAL | Status: DC
  Administered 2017-08-17: 1.5 mg via TRANSDERMAL
  Filled 2017-08-17: qty 1

## 2017-08-17 MED ORDER — FENTANYL CITRATE (PF) 100 MCG/2ML IJ SOLN
INTRAMUSCULAR | Status: AC
  Filled 2017-08-17: qty 2

## 2017-08-17 MED ORDER — PROPOFOL 10 MG/ML IV BOLUS
INTRAVENOUS | Status: DC | PRN
  Administered 2017-08-17: 20 mg via INTRAVENOUS

## 2017-08-17 MED ORDER — PHENYLEPHRINE 40 MCG/ML (10ML) SYRINGE FOR IV PUSH (FOR BLOOD PRESSURE SUPPORT)
PREFILLED_SYRINGE | INTRAVENOUS | Status: AC
  Filled 2017-08-17: qty 10

## 2017-08-17 MED ORDER — ONDANSETRON HCL 4 MG/2ML IJ SOLN
INTRAMUSCULAR | Status: DC | PRN
  Administered 2017-08-17: 4 mg via INTRAVENOUS

## 2017-08-17 MED ORDER — LIDOCAINE 2% (20 MG/ML) 5 ML SYRINGE
INTRAMUSCULAR | Status: AC
  Filled 2017-08-17: qty 5

## 2017-08-17 MED ORDER — ACETAMINOPHEN 160 MG/5ML PO SOLN
325.0000 mg | ORAL | Status: DC | PRN
  Filled 2017-08-17: qty 20.3

## 2017-08-17 MED ORDER — MEPERIDINE HCL 25 MG/ML IJ SOLN
6.2500 mg | INTRAMUSCULAR | Status: DC | PRN
  Filled 2017-08-17: qty 1

## 2017-08-17 MED ORDER — LACTATED RINGERS IV SOLN
INTRAVENOUS | Status: DC
  Administered 2017-08-17: 11:00:00 via INTRAVENOUS
  Filled 2017-08-17: qty 1000

## 2017-08-17 MED ORDER — SCOPOLAMINE 1 MG/3DAYS TD PT72
MEDICATED_PATCH | TRANSDERMAL | Status: AC
  Filled 2017-08-17: qty 1

## 2017-08-17 MED ORDER — DEXAMETHASONE SODIUM PHOSPHATE 10 MG/ML IJ SOLN
INTRAMUSCULAR | Status: DC | PRN
  Administered 2017-08-17: 10 mg via INTRAVENOUS

## 2017-08-17 MED ORDER — LIDOCAINE 2% (20 MG/ML) 5 ML SYRINGE
INTRAMUSCULAR | Status: DC | PRN
  Administered 2017-08-17: 60 mg via INTRAVENOUS

## 2017-08-17 MED ORDER — FENTANYL CITRATE (PF) 100 MCG/2ML IJ SOLN
INTRAMUSCULAR | Status: DC | PRN
  Administered 2017-08-17: 100 ug via INTRAVENOUS

## 2017-08-17 MED ORDER — ONDANSETRON HCL 4 MG/2ML IJ SOLN
INTRAMUSCULAR | Status: AC
  Filled 2017-08-17: qty 2

## 2017-08-17 MED ORDER — FENTANYL CITRATE (PF) 100 MCG/2ML IJ SOLN
25.0000 ug | INTRAMUSCULAR | Status: DC | PRN
  Filled 2017-08-17: qty 1

## 2017-08-17 MED ORDER — ACETAMINOPHEN 325 MG PO TABS
325.0000 mg | ORAL_TABLET | ORAL | Status: DC | PRN
  Filled 2017-08-17: qty 2

## 2017-08-17 MED ORDER — PROPOFOL 10 MG/ML IV BOLUS
INTRAVENOUS | Status: AC
  Filled 2017-08-17: qty 20

## 2017-08-17 MED ORDER — ONDANSETRON HCL 4 MG/2ML IJ SOLN
4.0000 mg | Freq: Once | INTRAMUSCULAR | Status: DC | PRN
  Filled 2017-08-17: qty 2

## 2017-08-17 MED ORDER — PROPOFOL 500 MG/50ML IV EMUL
INTRAVENOUS | Status: DC | PRN
  Administered 2017-08-17: 25 ug/kg/min via INTRAVENOUS

## 2017-08-17 MED ORDER — GENTAMICIN IN SALINE 1-0.9 MG/ML-% IV SOLN
100.0000 mg | Freq: Once | INTRAVENOUS | Status: AC
  Administered 2017-08-17: 100 mg via INTRAVENOUS
  Filled 2017-08-17 (×2): qty 100

## 2017-08-17 MED ORDER — GENTAMICIN SULFATE 40 MG/ML IJ SOLN
5.0000 mg/kg | INTRAVENOUS | Status: DC
  Filled 2017-08-17: qty 11.25

## 2017-08-17 MED ORDER — MIDAZOLAM HCL 5 MG/5ML IJ SOLN
INTRAMUSCULAR | Status: DC | PRN
  Administered 2017-08-17 (×2): 1 mg via INTRAVENOUS

## 2017-08-17 MED ORDER — DEXAMETHASONE SODIUM PHOSPHATE 10 MG/ML IJ SOLN
INTRAMUSCULAR | Status: AC
  Filled 2017-08-17: qty 1

## 2017-08-17 SURGICAL SUPPLY — 22 items
BAG DRAIN URO-CYSTO SKYTR STRL (DRAIN) ×2 IMPLANT
BASKET LASER NITINOL 1.9FR (BASKET) IMPLANT
CATH INTERMIT  6FR 70CM (CATHETERS) ×2 IMPLANT
CLOTH BEACON ORANGE TIMEOUT ST (SAFETY) ×2 IMPLANT
FIBER LASER FLEXIVA 365 (UROLOGICAL SUPPLIES) IMPLANT
FIBER LASER TRAC TIP (UROLOGICAL SUPPLIES) IMPLANT
GLOVE BIO SURGEON STRL SZ7.5 (GLOVE) ×2 IMPLANT
GOWN STRL REUS W/TWL LRG LVL3 (GOWN DISPOSABLE) ×2 IMPLANT
GUIDEWIRE ANG ZIPWIRE 038X150 (WIRE) ×2 IMPLANT
GUIDEWIRE STR DUAL SENSOR (WIRE) ×2 IMPLANT
INFUSOR MANOMETER BAG 3000ML (MISCELLANEOUS) ×2 IMPLANT
IV NS 1000ML (IV SOLUTION) ×1
IV NS 1000ML BAXH (IV SOLUTION) ×1 IMPLANT
IV NS IRRIG 3000ML ARTHROMATIC (IV SOLUTION) IMPLANT
KIT RM TURNOVER CYSTO AR (KITS) ×2 IMPLANT
MANIFOLD NEPTUNE II (INSTRUMENTS) ×2 IMPLANT
NS IRRIG 500ML POUR BTL (IV SOLUTION) ×4 IMPLANT
PACK CYSTO (CUSTOM PROCEDURE TRAY) ×2 IMPLANT
SYRINGE 10CC LL (SYRINGE) ×2 IMPLANT
TUBE CONNECTING 12X1/4 (SUCTIONS) ×2 IMPLANT
TUBE FEEDING 8FR 16IN STR KANG (MISCELLANEOUS) IMPLANT
WATER STERILE IRR 3000ML UROMA (IV SOLUTION) ×2 IMPLANT

## 2017-08-17 NOTE — Anesthesia Preprocedure Evaluation (Signed)
Anesthesia Evaluation  Patient identified by MRN, date of birth, ID band Patient awake    Reviewed: Allergy & Precautions, NPO status , Patient's Chart, lab work & pertinent test results  History of Anesthesia Complications (+) PONV and history of anesthetic complications  Airway Mallampati: II       Dental no notable dental hx. (+) Teeth Intact   Pulmonary neg pulmonary ROS,    Pulmonary exam normal        Cardiovascular hypertension, Normal cardiovascular exam Rhythm:Regular Rate:Normal     Neuro/Psych  Neuromuscular disease negative psych ROS   GI/Hepatic Neg liver ROS, hiatal hernia, GERD  Controlled,  Endo/Other  diabetes, Type 2, Oral Hypoglycemic AgentsHypothyroidism   Renal/GU Renal InsufficiencyRenal diseaseCKD  Female GU complaint     Musculoskeletal negative musculoskeletal ROS (+)   Abdominal (+) + obese,   Peds negative pediatric ROS (+)  Hematology negative hematology ROS (+) anemia ,   Anesthesia Other Findings Ptosis OD  Reproductive/Obstetrics negative OB ROS                             Lab Results  Component Value Date   WBC 10.9 (H) 07/29/2017   HGB 16.7 (H) 08/17/2017   HCT 49.0 (H) 08/17/2017   MCV 90.9 07/29/2017   PLT 260 07/29/2017   Lab Results  Component Value Date   CREATININE 3.00 (H) 08/17/2017   BUN 98 (H) 08/17/2017   NA 126 (L) 08/17/2017   K 4.5 08/17/2017   CL 101 08/17/2017   CO2 20 (L) 06/22/2017   Lab Results  Component Value Date   INR 1.21 04/16/2017   INR 1.18 12/31/2014   INR 1.22 05/09/2009   12/2015 EKG: normal sinus rhythm.   Anesthesia Physical  Anesthesia Plan  ASA: II  Anesthesia Plan: MAC   Post-op Pain Management:    Induction: Intravenous  PONV Risk Score and Plan: 4 or greater and Ondansetron, Scopolamine patch - Pre-op, Propofol infusion, Midazolam and Treatment may vary due to age or medical  condition  Airway Management Planned: Natural Airway, Nasal Cannula and Simple Face Mask  Additional Equipment:   Intra-op Plan:   Post-operative Plan: Extubation in OR  Informed Consent: I have reviewed the patients History and Physical, chart, labs and discussed the procedure including the risks, benefits and alternatives for the proposed anesthesia with the patient or authorized representative who has indicated his/her understanding and acceptance.   Dental advisory given  Plan Discussed with: CRNA and Surgeon  Anesthesia Plan Comments:         Anesthesia Quick Evaluation

## 2017-08-17 NOTE — Anesthesia Postprocedure Evaluation (Signed)
Anesthesia Post Note  Patient: Financial planner  Procedure(s) Performed: CYSTOSCOPY WITH RETROGRADE PYELOGRAM/URETERAL STENT REMOVAL (Right )     Patient location during evaluation: PACU Anesthesia Type: MAC Level of consciousness: awake Pain management: pain level controlled Vital Signs Assessment: post-procedure vital signs reviewed and stable Respiratory status: spontaneous breathing Cardiovascular status: stable Postop Assessment: no apparent nausea or vomiting Anesthetic complications: no    Last Vitals:  Vitals:   08/17/17 1300 08/17/17 1315  BP: 102/67 96/64  Pulse: 97 96  Resp: 14 13  Temp:    SpO2: 100% 100%    Last Pain:  Vitals:   08/17/17 1315  TempSrc:   PainSc: 0-No pain   Pain Goal: Patients Stated Pain Goal: 3 (08/17/17 1120)               Merle Cirelli JR,JOHN Arnez Stoneking

## 2017-08-17 NOTE — Brief Op Note (Signed)
08/17/2017  12:16 PM  PATIENT:  [name]  62 y.o. female  PRE-OPERATIVE DIAGNOSIS:  RIGHT URETERAL STRICTURE  POST-OPERATIVE DIAGNOSIS:  RIGHT URETERAL STRICTURE  PROCEDURE:  Procedure(s): CYSTOSCOPY WITH RETROGRADE PYELOGRAM/URETERAL STENT REMOVAL (Right)  SURGEON:  Surgeon(s) and Role:    * Alexis Frock, MD - Primary  PHYSICIAN ASSISTANT:   ASSISTANTS: none   ANESTHESIA:   general  EBL: none  BLOOD ADMINISTERED:none  DRAINS: none   LOCAL MEDICATIONS USED:  NONE  SPECIMEN:  No Specimen  DISPOSITION OF SPECIMEN:  N/A  COUNTS:  YES  TOURNIQUET:  * No tourniquets in log *  DICTATION: .Other Dictation: Dictation Number [id number]  PLAN OF CARE: Discharge to home after PACU  PATIENT DISPOSITION:  PACU - hemodynamically stable.   Delay start of Pharmacological VTE agent (>24hrs) due to surgical blood loss or risk of bleeding: not applicable

## 2017-08-17 NOTE — Transfer of Care (Signed)
Immediate Anesthesia Transfer of Care Note  Patient: [name]  Procedure(s) Performed: CYSTOSCOPY WITH RETROGRADE PYELOGRAM/URETERAL STENT REMOVAL (Right )  Patient Location: PACU  Anesthesia Type:MAC  Level of Consciousness: awake, alert  and oriented  Airway & Oxygen Therapy: Patient Spontanous Breathing and Patient connected to nasal cannula oxygen  Post-op Assessment: Report given to RN  Post vital signs: Reviewed and stable  Last Vitals: 90/42, 97, 17, 100%, 96.8 Vitals:   08/17/17 1013 08/17/17 1222  BP: (!) 94/54   Pulse: (!) 118   Resp: 16   Temp: (!) 36.4 C (!) (P) 36 C  SpO2: 100%     Last Pain:  Vitals:   08/17/17 1120  TempSrc:   PainSc: 6       Patients Stated Pain Goal: 3 (13/24/40 1027)  Complications: No apparent anesthesia complications

## 2017-08-17 NOTE — H&P (Signed)
[name] is an 62 y.o. female.    Chief Complaint: Pre-op Cystoscopy Right retrograde / Stent Removal  HPI:   1 - Bladder Injury / Right Ureteral Stricture - s/p robotic RIGHT ureteral reimplant with psoas hitch + HM Flap 04/01/17 at time of colostomy take down / adhesiolysis, small bowel resection, loop iliostomy for right distal stricture / bladder injury sustained at pelvic resection for advanced endometrial cancer 2016. Drain removed 9/6 as Cr same as serum and output scant.   Recent Post-op Course:  10/1 - cystogram normal, urinoma drain output <10mL per day ==> Removed foley, but then JP output jumped to >400 per day therefore foley replaced 10/2. Drain study shows likely small anterior bladder leak. Cr 0.9.  10/31 - Operative Cysto / Cystogram with small anterior leak, no foreign body. Leak along prior suture line that is still healing, very small.  12/13 - Removed foley again, JP to stay to verify no rapid filling --> JP removed as remained dry x few days.    2 - Metastatic Endometrial Cancer - s/p repeat resection 42016 with colon and vaginal involvement but negative nodes / margins. Had adjuvant chemo-XRT which she is now done with. Follows with Dr. Gorsuch.    PMH sig for DM2, morbid obesity, breast cancer, ovarian cancer, endometrial cancer.    Today "Shanekia" is seen to proceed with cystoscopy, right retrograde, and stent removal following complex right ureteral reconstruction  03/2017. No interval fevers. She has been on Cipro pre-op for serratia bacteruria by most recetn CX.    Past Medical History:  Diagnosis Date  . Adrenal adenoma, left 02/08/2016   CT: stable benign  . Anemia in neoplastic disease   . Benign essential HTN   . 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 Cancer of  corpus uteri, except isthmus (HCC)  oncologist-- dr rossi and dr gorsuch    10-15-2004  dx endometroid endometrial and ovarian cancer s/p  chemotheapy and surgery(TAH w/ BSO) :  recurrent 11-19-2014 post pelvic surgery and radiation 01-29-2015 to 03-10-2015  . Chronic idiopathic neutropenia (HCC)    presumed related to chemotherapy March 2006--- followed by dr gorsuch (treatment w/ G-CSF injections  . Chronic nausea   . Chronic pain    perineal/ anal  area from bladder pad irritates skin , right flank pain  . CKD stage G2/A3, GFR 60-89 and albumin creatinine ratio >300 mg/g    nephrologist-  dr elizabeth upton  . Diabetic retinopathy, background (HCC)   . Difficult intravenous access    small veins--- hx PICC lines  . DM type 2 (diabetes mellitus, type 2) (HCC)    monitored by dr michael altheimer  . Dysuria   . Environmental and seasonal allergies   . Fatty liver 02/08/2016   CT  . Generalized muscle weakness   . GERD (gastroesophageal reflux disease)   . Hiatal hernia   . History of abdominal abscess 04/16/2017   post surgery 04-01-2017  --- resolved 10/ 2018  . History of gastric polyp    2014  duodenum  . History of ileus 04/16/2017   resolved w/ no surgical intervention  . History of radiation therapy    01-29-2015 to 03-10-2015  pelvis 50.4Gy  . Hypothyroidism    monitored by dr michael altheimer  . Ileostomy in place (HCC)   04/01/2017   created at same time colostomy takedown.  . Lower urinary tract symptoms (LUTS)    urge urinary  incontinence  . Mixed dyslipidemia   . Multiple thyroid nodules    Managed by Dr. Harlow Asa  . PONV (postoperative nausea and vomiting)    "scopolamine patch works for me"  . Radiation-induced dermatitis    contact dermatitis , radiation completed, rash only on ankles now.  . Seasonal allergies   . Ureteral stricture, right UROLOGIT-  DR [facility]   CHRONIC--  TREATMENT URETERAL STENT  . Vitamin D deficiency   . Wears glasses     Past  Surgical History:  Procedure Laterality Date  . APPENDECTOMY    . BREAST RECONSTRUCTION WITH PLACEMENT OF TISSUE EXPANDER AND FLEX HD (ACELLULAR HYDRATED DERMIS) Bilateral 12/11/2015   Procedure: BILATERAL BREAST RECONSTRUCTION WITH PLACEMENT OF TISSUE EXPANDERS;  Surgeon: Irene Limbo, MD;  Location: [facility];  Service: Plastics;  Laterality: Bilateral;  . COLONOSCOPY WITH PROPOFOL N/A 08/21/2013   Procedure: COLONOSCOPY WITH PROPOFOL;  Surgeon: Cleotis Nipper, MD;  Location: [facility];  Service: Endoscopy;  Laterality: N/A;  . COLOSTOMY TAKEDOWN N/A 12/04/2014   Procedure: LAPROSCOPIC LYSIS OF ADHESIONS, SPLENIC MOBILIZATION, RELOCATION OF COLOSTOMY, DEBRIDEMENT INITIAL COLOSTOMY SITE;  Surgeon: Michael [name], MD;  Location: [facility];  Service: General;  Laterality: N/A;  . CYSTOGRAM N/A 06/01/2017   Procedure: CYSTOGRAM;  Surgeon: Alexis Frock, MD;  Location: [facility];  Service: Urology;  Laterality: N/A;  . CYSTOSCOPY W/ RETROGRADES Right 11/21/2015   Procedure: CYSTOSCOPY WITH RETROGRADE PYELOGRAM;  Surgeon: Alexis Frock, MD;  Location: [facility];  Service: Urology;  Laterality: Right;  . CYSTOSCOPY W/ URETERAL STENT PLACEMENT Right 11/21/2015   Procedure: CYSTOSCOPY WITH STENT REPLACEMENT;  Surgeon: Alexis Frock, MD;  Location: [facility];  Service: Urology;  Laterality: Right;  . CYSTOSCOPY W/ URETERAL STENT PLACEMENT Right 03/10/2016   Procedure: CYSTOSCOPY WITH STENT REPLACEMENT;  Surgeon: Alexis Frock, MD;  Location: [facility];  Service: Urology;  Laterality: Right;  . CYSTOSCOPY W/ URETERAL STENT PLACEMENT Right 06/30/2016   Procedure: CYSTOSCOPY WITH RETROGRADE PYELOGRAM/URETERAL STENT EXCHANGE;  Surgeon: Alexis Frock, MD;  Location: [facility];  Service: Urology;  Laterality: Right;  . CYSTOSCOPY W/ URETERAL STENT PLACEMENT N/A 06/01/2017   Procedure: CYSTOSCOPY WITH EXAM UNDER ANESTHESIA;  Surgeon: Alexis Frock, MD;  Location: [facility];  Service:  Urology;  Laterality: N/A;  . CYSTOSCOPY WITH RETROGRADE PYELOGRAM, URETEROSCOPY AND STENT PLACEMENT Right 03/20/2015   Procedure: CYSTOSCOPY WITH RETROGRADE PYELOGRAM, URETEROSCOPY WITH BALLOON DILATION AND STENT PLACEMENT ON RIGHT;  Surgeon: Alexis Frock, MD;  Location: [facility];  Service: Urology;  Laterality: Right;  . CYSTOSCOPY WITH RETROGRADE PYELOGRAM, URETEROSCOPY AND STENT PLACEMENT Right 05/02/2015   Procedure: CYSTOSCOPY WITH RIGHT RETROGRADE PYELOGRAM,  DIAGNOSTIC URETEROSCOPY AND STENT PULL ;  Surgeon: Alexis Frock, MD;  Location: [facility];  Service: Urology;  Laterality: Right;  . CYSTOSCOPY WITH RETROGRADE PYELOGRAM, URETEROSCOPY AND STENT PLACEMENT Right 09/05/2015   Procedure: CYSTOSCOPY WITH RETROGRADE PYELOGRAM,  AND STENT PLACEMENT;  Surgeon: Alexis Frock, MD;  Location: [facility];  Service: Urology;  Laterality: Right;  . CYSTOSCOPY WITH RETROGRADE PYELOGRAM, URETEROSCOPY AND STENT PLACEMENT Right 04/01/2017   Procedure: CYSTOSCOPY WITH RETROGRADE PYELOGRAM, URETEROSCOPY AND STENT PLACEMENT;  Surgeon: Alexis Frock, MD;  [facility];  Service: Urology;  Laterality: Right;  . CYSTOSCOPY WITH STENT PLACEMENT Right 10/27/2016   Procedure: CYSTOSCOPY WITH STENT CHANGE and right retrograde pyelogram;  Surgeon:  Alexis Frock, MD;  Location: [facility];  Service: Urology;  Laterality: Right;  . EUS N/A 10/02/2014   Procedure: LOWER ENDOSCOPIC ULTRASOUND (EUS);  Surgeon: Arta Silence, MD;  Location: [facility];  Service: Endoscopy;  Laterality: N/A;  . EXCISION SOFT TISSUE MASS RIGHT FOREMAN  12-08-2006  . EYE SURGERY  as child   pytosis of eyelids repair  . INCISION AND DRAINAGE OF WOUND Bilateral 12/26/2015   Procedure: DEBRIDEMENT OF BILATERAL MASTECTOMY FLAPS;  Surgeon: Irene Limbo, MD;  Location: [facility];  Service: Plastics;  Laterality: Bilateral;  . IR CV LINE INJECTION  05/31/2017  . IR FLUORO  GUIDE CV LINE LEFT  05/31/2017  . IR FLUORO GUIDE CV LINE RIGHT  04/06/2017  . IR FLUORO GUIDE CV MIDLINE PICC RIGHT  05/30/2017  . IR RADIOLOGIST EVAL & MGMT  05/03/2017  . IR US GUIDE VASC ACCESS LEFT  05/31/2017  . IR US GUIDE VASC ACCESS RIGHT  04/06/2017  . IR US GUIDE VASC ACCESS RIGHT  05/30/2017  . LAPAROSCOPIC CHOLECYSTECTOMY  1990  . LIPOSUCTION WITH LIPOFILLING Bilateral 04/16/2016   Procedure: LIPOSUCTION WITH LIPOFILLING TO BILATERAL CHEST;  Surgeon: Irene Limbo, MD;  Location: [facility];  Service: Plastics;  Laterality: Bilateral;  . MASTECTOMY W/ SENTINEL NODE BIOPSY Bilateral 12/11/2015   Procedure: RIGHT PROPHYLACTIC MASTECTOMY, LEFT TOTAL MASTECTOMY WITH LEFT AXILLARY SENTINEL LYMPH NODE BIOPSY;  Surgeon: Stark Klein, MD;  Location: [facility];  Service: General;  Laterality: Bilateral;  . OSTOMY N/A 11/19/2014   Procedure: OSTOMY;  Surgeon: Michael [name], MD;  Location: [facility];  Service: General;  Laterality: N/A;  . PROCTOSCOPY N/A 04/01/2017   Procedure: RIDGE PROCTOSCOPY;  Surgeon: Michael [name], MD;  Location: [facility];  Service: General;  Laterality: N/A;  . REMOVAL OF BILATERAL TISSUE EXPANDERS WITH PLACEMENT OF BILATERAL BREAST IMPLANTS Bilateral 04/16/2016   Procedure: REMOVAL OF BILATERAL TISSUE EXPANDERS WITH PLACEMENT OF BILATERAL BREAST IMPLANTS;  Surgeon: Irene Limbo, MD;  Location: [facility];  Service: Plastics;  Laterality: Bilateral;  . ROBOTIC ASSISTED LAP VAGINAL HYSTERECTOMY N/A 11/19/2014   Procedure: ROBOTIC LYSIS OF ADHESIONS, CONVERTED TO LAPAROTOMY RADICAL UPPER VAGINECTOMY,LOW ANTERIOR BOWEL RESECTION, COLOSTOMY, BILATERAL URETERAL STENT PLACEMENT AND CYSTONOMY CLOSURE;  Surgeon: Everitt Amber, MD;  Location: [facility];  Service: Gynecology;  Laterality: N/A;  . TISSUE EXPANDER FILLING Bilateral 12/26/2015   Procedure: EXPANSION OF BILATERAL CHEST TISSUE EXPANDERS (60 mL- Right; 75 mL- Left);  Surgeon: Irene Limbo, MD;  Location:  [place];  Service: Plastics;  Laterality: Bilateral;  . TONSILLECTOMY    . TOTAL ABDOMINAL HYSTERECTOMY  March 2006   [facility]   and Bilateral Salpingoophorectomy/  staging for Ovarian cancer/  an  . XI ROBOTIC ASSISTED LOWER ANTERIOR RESECTION N/A 04/01/2017   Procedure: XI ROBOTIC VS LAPAROSCOPIC COLOSTOMY TAKEDOWN WITH LYSIS OF ADHESIONS.;  Surgeon: Michael [name], MD;  Location: [facility];  Service: General;  Laterality: N/A;  ERAS PATHWAY    Family History  Problem Relation Age of Onset  . Cancer Mother 29       stomach ca  . Hypertension Mother   . Cancer Father 40       prostate ca  . Diabetes Father   . Heart disease Father        CABG  . Breast cancer Maternal Aunt        dx in her 24s  . Lymphoma Paternal Aunt   . Brain cancer Paternal Grandfather   .  Ovarian cancer Other   . Diabetes Sister   . Hypertension Brother y-10  . Heart disease Brother        CABG  . Diabetes Brother    Social History:  reports that  has never smoked. she has never used smokeless tobacco. She reports that she drinks alcohol. She reports that she does not use drugs.  Allergies:  Allergies  Allergen Reactions  . Penicillins Swelling    Facial swelling/childhood allergy Has patient had a PCN reaction causing immediate rash, facial/tongue/throat swelling, SOB or lightheadedness with hypotension: Yes Has patient had a PCN reaction causing severe rash involving mucus membranes or skin necrosis: Yes Has patient had a PCN reaction that required hospitalization yes Has patient had a PCN reaction occurring within the last 10 years: No If all of the above answers are "NO", then may proceed with Cephalosporin use.   [name] Kitchen Ultram [Tramadol] Hives  . Adhesive [Tape]     blisters  . Cefaclor Rash    Ceclor  . Erythromycin     Gastritis, abd cramps  . Trimethoprim Rash  . Ciprofloxacin Other (See Comments)    Unknown On Dr notes   . Fluconazole Rash  . Oxycodone     " I just feel  weird"  . Pectin Rash    Pectin ring for stoma  . Sulfa Antibiotics Rash    No medications prior to admission.    No results found for this or any previous visit (from the past 48 hour(s)). No results found.  Review of Systems  Constitutional: Negative.  Negative for chills and fever.  HENT: Negative.   Eyes: Negative.   Respiratory: Negative.   Cardiovascular: Negative.   Gastrointestinal: Negative.   Genitourinary: Negative.   Musculoskeletal: Negative.   Skin: Negative.   Neurological: Negative.   Endo/Heme/Allergies: Negative.   Psychiatric/Behavioral: Negative.     There were no vitals taken for this visit. Physical Exam  Constitutional: She appears well-developed.  HENT:  Head: Normocephalic.  Eyes: Pupils are equal, round, and reactive to light.  Neck: Normal range of motion.  Cardiovascular: Normal rate.  Respiratory: Effort normal.  GI:  Stable obesity, multiple scars w/o hernias. Colosotmy patent.   Genitourinary:  Genitourinary Comments: No CVAT.   Neurological: She is alert.  Skin: Skin is warm.  Psychiatric: She has a normal mood and affect.     Assessment/Plan  Proceed as planned with cysto, right retrograde and stent removal as long as repaired area appears patent and w/o extravasation. Risks, benefits, alternatives, expected peri-op course discussed previously and reiterated today.   Alexis Frock, MD 08/17/2017, 6:09 AM

## 2017-08-17 NOTE — Anesthesia Procedure Notes (Signed)
Procedure Name: MAC Date/Time: 08/17/2017 12:04 PM Performed by: Bonney Aid, CRNA Pre-anesthesia Checklist: Patient identified, Timeout performed, Emergency Drugs available, Suction available and Patient being monitored Patient Re-evaluated:Patient Re-evaluated prior to induction Oxygen Delivery Method: Circle system utilized

## 2017-08-17 NOTE — Discharge Instructions (Signed)
1 - You may have  bloody urine on / off x few days. This is normal.  2 - Call MD or go to ER for fever >102, severe pain / nausea / vomiting not relieved by medications, or acute change in medical status  CYSTOSCOPY HOME CARE INSTRUCTIONS  Activity: Rest for the remainder of the day.  Do not drive or operate equipment today.  You may resume normal activities in one to two days as instructed by your physician.   Meals: Drink plenty of liquids and eat light foods such as gelatin or soup this evening.  You may return to a normal meal plan tomorrow.  Special Instructions / Symptoms: Call your physician if any of these symptoms occur:   -persistent or heavy bleeding  -bleeding which continues after first few urination  -large blood clots that are difficult to pass  -urine stream diminishes or stops completely  -fever equal to or higher than 101 degrees Farenheit.  -cloudy urine with a strong, foul odor  -severe pain  Females should always wipe from front to back after elimination.  You may feel some burning pain when you urinate.  This should disappear with time.    Post Anesthesia Home Care Instructions  Activity: Get plenty of rest for the remainder of the day. A responsible individual must stay with you for 24 hours following the procedure.  For the next 24 hours, DO NOT: -Drive a car -Paediatric nurse -Drink alcoholic beverages -Take any medication unless instructed by your physician -Make any legal decisions or sign important papers.  Meals: Start with liquid foods such as gelatin or soup. Progress to regular foods as tolerated. Avoid greasy, spicy, heavy foods. If nausea and/or vomiting occur, drink only clear liquids until the nausea and/or vomiting subsides. Call your physician if vomiting continues.  Special Instructions/Symptoms: Your throat may feel dry or sore from the anesthesia or the breathing tube placed in your throat during surgery. If this causes discomfort,  gargle with warm salt water. The discomfort should disappear within 24 hours.  If you had a scopolamine patch placed behind your ear for the management of post- operative nausea and/or vomiting:  1. The medication in the patch is effective for 72 hours, after which it should be removed.  Wrap patch in a tissue and discard in the trash. Wash hands thoroughly with soap and water. 2. You may remove the patch earlier than 72 hours if you experience unpleasant side effects which may include dry mouth, dizziness or visual disturbances. 3. Avoid touching the patch. Wash your hands with soap and water after contact with the patch.

## 2017-08-18 ENCOUNTER — Encounter: Payer: BLUE CROSS/BLUE SHIELD | Admitting: Physical Therapy

## 2017-08-18 ENCOUNTER — Encounter (HOSPITAL_BASED_OUTPATIENT_CLINIC_OR_DEPARTMENT_OTHER): Payer: Self-pay | Admitting: Urology

## 2017-08-18 DIAGNOSIS — L89319 Pressure ulcer of right buttock, unspecified stage: Secondary | ICD-10-CM | POA: Diagnosis not present

## 2017-08-18 DIAGNOSIS — Z933 Colostomy status: Secondary | ICD-10-CM | POA: Diagnosis not present

## 2017-08-18 NOTE — Op Note (Signed)
NAME:  [name], [name]                     ACCOUNT NO.:  MEDICAL RECORD NO.:  [id number]  LOCATION:                                 FACILITY:  PHYSICIAN:  Alexis Frock, MD     DATE OF BIRTH:  [birth year]  DATE OF PROCEDURE: 08/17/2017                              OPERATIVE REPORT   DIAGNOSIS:  Right ureteral stricture, status post complex reconstruction.  PROCEDURES: 1. Cystoscopy with right retrograde pyelogram. 2. Removal of right ureteral stent.  ESTIMATED BLOOD LOSS:  Nil.  COMPLICATION:  None.  SPECIMENS:  Right ureteral stone for discard, inspected and intact.  FINDINGS: 1. Fusiform bladder shape consistent with prior psoas hitch. 2. Visibly patent ureteral-bladder anastomosis. 3. No evidence of hydronephrosis or narrowing with right retrograde     pyelogram.  INDICATIONS:  [name] is a very pleasant, but unfortunate 62 year old lady with history of gynecologic malignancy.  She is status post surgery, radiation and has had multiple problems stemming from her course with this including a right ureteral stricture.  She elected to undergo complex reconstruction of the right psoas hitch and reimplant in August of last year.  She had a small bladder leak that took some time to heel, this was since resolved.  She now presents for a stent removal. Given her complex history and course, it was felt that this bring down the operative suite, so the simultaneous retrograde pyelography could be performed, could be most advantageous.  Informed consent was obtained and placed in the medical record.  PROCEDURE IN DETAIL:  [name], was verified. Procedure being cysto, right retrograde, right stent pole was confirmed. Procedure was carried out.  Time-out was performed.  Intravenous antibiotics were administered, monitored anesthesia care, sedation was delivered.  The patient was placed into a low lithotomy position. Sterile field was created by prepping and  draping the patient's vagina, introitus and proximal thighs using iodine.  Next, cystourethroscopy was performed using a 22-French rigid cystoscope with offset lens. Inspection of the urinary bladder revealed no diverticula, calcifications, or papular lesions.  Distal end of stent was seen in situ exiting the neoureteral orifice in the superior right bladder as expected.  The area of anastomosis was visibly patent, and spot fluoroscopy revealed proximal and likely in the kidney.  This was grasped with cold grasper, removed, and set aside for discard.  The new right ureteral orifice was then cannulated with 6-French end-hole catheter and right retrograde pyelogram was obtained.  Right retrograde pyelogram demonstrated a single right ureter with single-system right kidney.  No filling defects or narrowing noted whatsoever.  There was prompt efflux of contrast following retrograde pyelogram.  This was favorable and corroborated.  No evidence of obvious early stricturing.  No evidence of any persistent leak.  Incision was made to leave the stent out.  Bladder was emptied per cystoscope. Procedure was then terminated.  The patient tolerated the procedure well.  No immediate periprocedural complications.  The patient was taken to the postanesthesia care unit in stable condition.          ______________________________ Alexis Frock, MD     TM/MEDQ  D:  08/17/2017  T:  08/18/2017  Job:  [id number]

## 2017-08-23 DIAGNOSIS — Z932 Ileostomy status: Secondary | ICD-10-CM | POA: Diagnosis not present

## 2017-08-24 ENCOUNTER — Encounter: Payer: Self-pay | Admitting: Surgery

## 2017-08-24 DIAGNOSIS — N183 Chronic kidney disease, stage 3 unspecified: Secondary | ICD-10-CM | POA: Insufficient documentation

## 2017-08-24 DIAGNOSIS — Z9889 Other specified postprocedural states: Secondary | ICD-10-CM | POA: Diagnosis not present

## 2017-08-24 DIAGNOSIS — R198 Other specified symptoms and signs involving the digestive system and abdomen: Secondary | ICD-10-CM | POA: Diagnosis not present

## 2017-08-24 DIAGNOSIS — Z932 Ileostomy status: Secondary | ICD-10-CM | POA: Diagnosis not present

## 2017-08-25 ENCOUNTER — Other Ambulatory Visit: Payer: Self-pay | Admitting: Surgery

## 2017-08-25 ENCOUNTER — Ambulatory Visit
Admission: RE | Admit: 2017-08-25 | Discharge: 2017-08-25 | Disposition: A | Discharge status: Home / Self Care | Payer: BLUE CROSS/BLUE SHIELD | Source: Ambulatory Visit | Attending: Surgery | Admitting: Surgery

## 2017-08-25 ENCOUNTER — Encounter: Payer: BLUE CROSS/BLUE SHIELD | Admitting: Physical Therapy

## 2017-08-25 ENCOUNTER — Ambulatory Visit (HOSPITAL_COMMUNITY)
Admission: RE | Admit: 2017-08-25 | Discharge: 2017-08-25 | Disposition: A | Discharge status: Home / Self Care | Payer: BLUE CROSS/BLUE SHIELD | Source: Ambulatory Visit | Attending: Surgery | Admitting: Surgery

## 2017-08-25 ENCOUNTER — Other Ambulatory Visit (HOSPITAL_COMMUNITY): Payer: Self-pay | Admitting: Surgery

## 2017-08-25 DIAGNOSIS — N1330 Unspecified hydronephrosis: Secondary | ICD-10-CM | POA: Diagnosis not present

## 2017-08-25 DIAGNOSIS — R109 Unspecified abdominal pain: Secondary | ICD-10-CM

## 2017-08-25 DIAGNOSIS — E860 Dehydration: Secondary | ICD-10-CM | POA: Diagnosis not present

## 2017-08-25 DIAGNOSIS — Z452 Encounter for adjustment and management of vascular access device: Secondary | ICD-10-CM | POA: Diagnosis not present

## 2017-08-25 MED ORDER — SODIUM CHLORIDE 0.9 % IV SOLN: 4.0000 mg | INTRAVENOUS | Status: DC | PRN

## 2017-08-25 MED ORDER — LACTATED RINGERS IV SOLN: INTRAVENOUS | Status: DC

## 2017-08-25 MED ORDER — ONDANSETRON 4 MG PO TBDP: 4.0000 mg | ORAL_TABLET | ORAL | Status: DC | PRN

## 2017-08-25 MED ORDER — LIDOCAINE HCL 1 % IJ SOLN
INTRAMUSCULAR | Status: AC | PRN
  Administered 2017-08-25: 5 mL

## 2017-08-25 MED ORDER — LACTATED RINGERS IV BOLUS (SEPSIS): 2000.0000 mL | Freq: Once | INTRAVENOUS | Status: DC

## 2017-08-25 MED ORDER — HEPARIN SOD (PORK) LOCK FLUSH 100 UNIT/ML IV SOLN
INTRAVENOUS | Status: AC
  Filled 2017-08-25: qty 5

## 2017-08-25 MED ORDER — HEPARIN SOD (PORK) LOCK FLUSH 100 UNIT/ML IV SOLN
INTRAVENOUS | Status: AC | PRN
  Administered 2017-08-25: 500 [IU]

## 2017-08-25 MED ORDER — LIDOCAINE HCL 1 % IJ SOLN
INTRAMUSCULAR | Status: AC
  Filled 2017-08-25: qty 20

## 2017-08-26 ENCOUNTER — Ambulatory Visit (HOSPITAL_COMMUNITY)
Admission: RE | Admit: 2017-08-26 | Discharge: 2017-08-26 | Disposition: A | Discharge status: Home / Self Care | Payer: BLUE CROSS/BLUE SHIELD | Source: Ambulatory Visit | Attending: Internal Medicine | Admitting: Internal Medicine

## 2017-08-26 ENCOUNTER — Other Ambulatory Visit: Payer: Self-pay | Admitting: Surgery

## 2017-08-26 DIAGNOSIS — Z9889 Other specified postprocedural states: Secondary | ICD-10-CM | POA: Insufficient documentation

## 2017-08-26 DIAGNOSIS — Z932 Ileostomy status: Secondary | ICD-10-CM | POA: Insufficient documentation

## 2017-08-26 MED ORDER — ONDANSETRON 4 MG PO TBDP
4.0000 mg | ORAL_TABLET | ORAL | Status: DC | PRN
Start: 2017-08-26 — End: 2017-09-04

## 2017-08-26 MED ORDER — LACTATED RINGERS IV BOLUS (SEPSIS): 2000.0000 mL | Freq: Once | INTRAVENOUS | Status: DC

## 2017-08-26 MED ORDER — HEPARIN SOD (PORK) LOCK FLUSH 100 UNIT/ML IV SOLN
500.0000 [IU] | INTRAVENOUS | Status: AC | PRN
  Administered 2017-08-26: 500 [IU]

## 2017-08-26 MED ORDER — LACTATED RINGERS IV BOLUS (SEPSIS)
2000.0000 mL | Freq: Once | INTRAVENOUS | Status: AC
  Administered 2017-08-26: 2000 mL via INTRAVENOUS

## 2017-08-26 MED ORDER — SODIUM CHLORIDE 0.9% FLUSH
10.0000 mL | INTRAVENOUS | Status: AC | PRN
  Administered 2017-08-26: 10 mL

## 2017-08-26 MED ORDER — LACTATED RINGERS IV SOLN: INTRAVENOUS | Status: DC

## 2017-08-26 MED ORDER — SODIUM CHLORIDE 0.9 % IV SOLN: 4.0000 mg | INTRAVENOUS | Status: DC | PRN

## 2017-08-26 NOTE — Progress Notes (Signed)
Pt admitted to [facility] for IV fluid bolus. After difficulty obtaining orders, pt received 2L Lactated Ringers via PICC line without complication. Discharged to home in stable ambulatory condition accompanied by husband.  Coolidge Breeze, RN 08/26/2017

## 2017-08-29 ENCOUNTER — Inpatient Hospital Stay (HOSPITAL_COMMUNITY)
Admission: AD | Admit: 2017-08-29 | Discharge: 2017-09-25 | DRG: 683 | Disposition: A | Discharge status: Skilled Nursing Facility | Payer: BLUE CROSS/BLUE SHIELD | Source: Ambulatory Visit | Attending: Family Medicine | Admitting: Family Medicine

## 2017-08-29 ENCOUNTER — Ambulatory Visit (HOSPITAL_COMMUNITY)
Admission: RE | Admit: 2017-08-29 | Discharge: 2017-08-29 | Disposition: A | Discharge status: Home / Self Care | Payer: BLUE CROSS/BLUE SHIELD | Source: Ambulatory Visit | Attending: Surgery | Admitting: Surgery

## 2017-08-29 ENCOUNTER — Encounter (HOSPITAL_COMMUNITY): Payer: Self-pay

## 2017-08-29 ENCOUNTER — Other Ambulatory Visit: Payer: Self-pay

## 2017-08-29 ENCOUNTER — Encounter (HOSPITAL_BASED_OUTPATIENT_CLINIC_OR_DEPARTMENT_OTHER): Payer: BLUE CROSS/BLUE SHIELD

## 2017-08-29 ENCOUNTER — Other Ambulatory Visit: Payer: Self-pay | Admitting: Surgery

## 2017-08-29 DIAGNOSIS — Z888 Allergy status to other drugs, medicaments and biological substances status: Secondary | ICD-10-CM

## 2017-08-29 DIAGNOSIS — E860 Dehydration: Secondary | ICD-10-CM | POA: Diagnosis not present

## 2017-08-29 DIAGNOSIS — E782 Mixed hyperlipidemia: Secondary | ICD-10-CM | POA: Diagnosis present

## 2017-08-29 DIAGNOSIS — Z932 Ileostomy status: Principal | ICD-10-CM

## 2017-08-29 DIAGNOSIS — D61818 Other pancytopenia: Secondary | ICD-10-CM | POA: Diagnosis present

## 2017-08-29 DIAGNOSIS — D509 Iron deficiency anemia, unspecified: Secondary | ICD-10-CM | POA: Diagnosis not present

## 2017-08-29 DIAGNOSIS — N131 Hydronephrosis with ureteral stricture, not elsewhere classified: Secondary | ICD-10-CM | POA: Diagnosis not present

## 2017-08-29 DIAGNOSIS — M6281 Muscle weakness (generalized): Secondary | ICD-10-CM | POA: Diagnosis not present

## 2017-08-29 DIAGNOSIS — E039 Hypothyroidism, unspecified: Secondary | ICD-10-CM | POA: Diagnosis not present

## 2017-08-29 DIAGNOSIS — N136 Pyonephrosis: Secondary | ICD-10-CM | POA: Diagnosis not present

## 2017-08-29 DIAGNOSIS — Z8543 Personal history of malignant neoplasm of ovary: Secondary | ICD-10-CM | POA: Diagnosis not present

## 2017-08-29 DIAGNOSIS — K567 Ileus, unspecified: Secondary | ICD-10-CM | POA: Diagnosis present

## 2017-08-29 DIAGNOSIS — N183 Chronic kidney disease, stage 3 unspecified: Secondary | ICD-10-CM | POA: Diagnosis present

## 2017-08-29 DIAGNOSIS — R52 Pain, unspecified: Secondary | ICD-10-CM

## 2017-08-29 DIAGNOSIS — Z7984 Long term (current) use of oral hypoglycemic drugs: Secondary | ICD-10-CM

## 2017-08-29 DIAGNOSIS — E119 Type 2 diabetes mellitus without complications: Secondary | ICD-10-CM

## 2017-08-29 DIAGNOSIS — R1900 Intra-abdominal and pelvic swelling, mass and lump, unspecified site: Secondary | ICD-10-CM | POA: Diagnosis not present

## 2017-08-29 DIAGNOSIS — L98419 Non-pressure chronic ulcer of buttock with unspecified severity: Secondary | ICD-10-CM | POA: Diagnosis present

## 2017-08-29 DIAGNOSIS — Z8589 Personal history of malignant neoplasm of other organs and systems: Secondary | ICD-10-CM

## 2017-08-29 DIAGNOSIS — N3945 Continuous leakage: Secondary | ICD-10-CM | POA: Diagnosis not present

## 2017-08-29 DIAGNOSIS — N321 Vesicointestinal fistula: Secondary | ICD-10-CM | POA: Diagnosis not present

## 2017-08-29 DIAGNOSIS — Z170 Estrogen receptor positive status [ER+]: Secondary | ICD-10-CM

## 2017-08-29 DIAGNOSIS — N390 Urinary tract infection, site not specified: Secondary | ICD-10-CM | POA: Diagnosis not present

## 2017-08-29 DIAGNOSIS — N739 Female pelvic inflammatory disease, unspecified: Secondary | ICD-10-CM

## 2017-08-29 DIAGNOSIS — N179 Acute kidney failure, unspecified: Secondary | ICD-10-CM

## 2017-08-29 DIAGNOSIS — I129 Hypertensive chronic kidney disease with stage 1 through stage 4 chronic kidney disease, or unspecified chronic kidney disease: Secondary | ICD-10-CM | POA: Diagnosis present

## 2017-08-29 DIAGNOSIS — E440 Moderate protein-calorie malnutrition: Secondary | ICD-10-CM | POA: Diagnosis present

## 2017-08-29 DIAGNOSIS — E871 Hypo-osmolality and hyponatremia: Secondary | ICD-10-CM | POA: Diagnosis not present

## 2017-08-29 DIAGNOSIS — N135 Crossing vessel and stricture of ureter without hydronephrosis: Secondary | ICD-10-CM | POA: Diagnosis not present

## 2017-08-29 DIAGNOSIS — R198 Other specified symptoms and signs involving the digestive system and abdomen: Secondary | ICD-10-CM | POA: Diagnosis not present

## 2017-08-29 DIAGNOSIS — E11319 Type 2 diabetes mellitus with unspecified diabetic retinopathy without macular edema: Secondary | ICD-10-CM | POA: Diagnosis present

## 2017-08-29 DIAGNOSIS — T8149XA Infection following a procedure, other surgical site, initial encounter: Secondary | ICD-10-CM | POA: Diagnosis present

## 2017-08-29 DIAGNOSIS — E1121 Type 2 diabetes mellitus with diabetic nephropathy: Secondary | ICD-10-CM | POA: Diagnosis not present

## 2017-08-29 DIAGNOSIS — Z9889 Other specified postprocedural states: Secondary | ICD-10-CM | POA: Diagnosis not present

## 2017-08-29 DIAGNOSIS — R1030 Lower abdominal pain, unspecified: Secondary | ICD-10-CM | POA: Diagnosis not present

## 2017-08-29 DIAGNOSIS — N2882 Megaloureter: Secondary | ICD-10-CM | POA: Diagnosis present

## 2017-08-29 DIAGNOSIS — C50812 Malignant neoplasm of overlapping sites of left female breast: Secondary | ICD-10-CM | POA: Diagnosis present

## 2017-08-29 DIAGNOSIS — C569 Malignant neoplasm of unspecified ovary: Secondary | ICD-10-CM | POA: Diagnosis not present

## 2017-08-29 DIAGNOSIS — E1165 Type 2 diabetes mellitus with hyperglycemia: Secondary | ICD-10-CM | POA: Diagnosis present

## 2017-08-29 DIAGNOSIS — F418 Other specified anxiety disorders: Secondary | ICD-10-CM | POA: Diagnosis present

## 2017-08-29 DIAGNOSIS — F321 Major depressive disorder, single episode, moderate: Secondary | ICD-10-CM | POA: Diagnosis not present

## 2017-08-29 DIAGNOSIS — Z9013 Acquired absence of bilateral breasts and nipples: Secondary | ICD-10-CM

## 2017-08-29 DIAGNOSIS — T82594D Other mechanical complication of infusion catheter, subsequent encounter: Secondary | ICD-10-CM | POA: Diagnosis not present

## 2017-08-29 DIAGNOSIS — R0602 Shortness of breath: Secondary | ICD-10-CM | POA: Diagnosis not present

## 2017-08-29 DIAGNOSIS — F419 Anxiety disorder, unspecified: Secondary | ICD-10-CM | POA: Diagnosis not present

## 2017-08-29 DIAGNOSIS — Z9221 Personal history of antineoplastic chemotherapy: Secondary | ICD-10-CM

## 2017-08-29 DIAGNOSIS — N99528 Other complication of other external stoma of urinary tract: Secondary | ICD-10-CM | POA: Diagnosis not present

## 2017-08-29 DIAGNOSIS — Z452 Encounter for adjustment and management of vascular access device: Secondary | ICD-10-CM | POA: Diagnosis not present

## 2017-08-29 DIAGNOSIS — E872 Acidosis: Secondary | ICD-10-CM | POA: Diagnosis present

## 2017-08-29 DIAGNOSIS — Z933 Colostomy status: Secondary | ICD-10-CM | POA: Diagnosis not present

## 2017-08-29 DIAGNOSIS — Z79811 Long term (current) use of aromatase inhibitors: Secondary | ICD-10-CM

## 2017-08-29 DIAGNOSIS — R8271 Bacteriuria: Secondary | ICD-10-CM | POA: Diagnosis not present

## 2017-08-29 DIAGNOSIS — E869 Volume depletion, unspecified: Secondary | ICD-10-CM | POA: Diagnosis not present

## 2017-08-29 DIAGNOSIS — Z6832 Body mass index (BMI) 32.0-32.9, adult: Secondary | ICD-10-CM

## 2017-08-29 DIAGNOSIS — N132 Hydronephrosis with renal and ureteral calculous obstruction: Secondary | ICD-10-CM | POA: Diagnosis not present

## 2017-08-29 DIAGNOSIS — Z923 Personal history of irradiation: Secondary | ICD-10-CM

## 2017-08-29 DIAGNOSIS — N1330 Unspecified hydronephrosis: Secondary | ICD-10-CM | POA: Diagnosis not present

## 2017-08-29 DIAGNOSIS — Z885 Allergy status to narcotic agent status: Secondary | ICD-10-CM

## 2017-08-29 DIAGNOSIS — E46 Unspecified protein-calorie malnutrition: Secondary | ICD-10-CM | POA: Diagnosis not present

## 2017-08-29 DIAGNOSIS — M25551 Pain in right hip: Secondary | ICD-10-CM | POA: Diagnosis not present

## 2017-08-29 DIAGNOSIS — Z85048 Personal history of other malignant neoplasm of rectum, rectosigmoid junction, and anus: Secondary | ICD-10-CM | POA: Diagnosis not present

## 2017-08-29 DIAGNOSIS — K9189 Other postprocedural complications and disorders of digestive system: Secondary | ICD-10-CM | POA: Diagnosis not present

## 2017-08-29 DIAGNOSIS — M545 Low back pain: Secondary | ICD-10-CM | POA: Diagnosis not present

## 2017-08-29 DIAGNOSIS — Z882 Allergy status to sulfonamides status: Secondary | ICD-10-CM

## 2017-08-29 DIAGNOSIS — Z515 Encounter for palliative care: Secondary | ICD-10-CM | POA: Diagnosis not present

## 2017-08-29 DIAGNOSIS — F4323 Adjustment disorder with mixed anxiety and depressed mood: Secondary | ICD-10-CM | POA: Diagnosis not present

## 2017-08-29 DIAGNOSIS — N289 Disorder of kidney and ureter, unspecified: Secondary | ICD-10-CM | POA: Diagnosis not present

## 2017-08-29 DIAGNOSIS — R390 Extravasation of urine: Secondary | ICD-10-CM | POA: Diagnosis not present

## 2017-08-29 DIAGNOSIS — L89319 Pressure ulcer of right buttock, unspecified stage: Secondary | ICD-10-CM | POA: Diagnosis not present

## 2017-08-29 DIAGNOSIS — K219 Gastro-esophageal reflux disease without esophagitis: Secondary | ICD-10-CM | POA: Diagnosis present

## 2017-08-29 DIAGNOSIS — G8929 Other chronic pain: Secondary | ICD-10-CM | POA: Diagnosis not present

## 2017-08-29 DIAGNOSIS — Z9882 Breast implant status: Secondary | ICD-10-CM

## 2017-08-29 DIAGNOSIS — E1122 Type 2 diabetes mellitus with diabetic chronic kidney disease: Secondary | ICD-10-CM | POA: Diagnosis present

## 2017-08-29 DIAGNOSIS — D630 Anemia in neoplastic disease: Secondary | ICD-10-CM | POA: Diagnosis present

## 2017-08-29 DIAGNOSIS — E876 Hypokalemia: Secondary | ICD-10-CM | POA: Diagnosis present

## 2017-08-29 DIAGNOSIS — R1084 Generalized abdominal pain: Secondary | ICD-10-CM | POA: Diagnosis not present

## 2017-08-29 DIAGNOSIS — Z79899 Other long term (current) drug therapy: Secondary | ICD-10-CM | POA: Diagnosis not present

## 2017-08-29 DIAGNOSIS — K651 Peritoneal abscess: Secondary | ICD-10-CM | POA: Diagnosis not present

## 2017-08-29 DIAGNOSIS — Z881 Allergy status to other antibiotic agents status: Secondary | ICD-10-CM

## 2017-08-29 DIAGNOSIS — Z79890 Hormone replacement therapy (postmenopausal): Secondary | ICD-10-CM

## 2017-08-29 DIAGNOSIS — N200 Calculus of kidney: Secondary | ICD-10-CM | POA: Diagnosis not present

## 2017-08-29 DIAGNOSIS — E861 Hypovolemia: Secondary | ICD-10-CM | POA: Diagnosis not present

## 2017-08-29 DIAGNOSIS — N170 Acute kidney failure with tubular necrosis: Principal | ICD-10-CM | POA: Diagnosis present

## 2017-08-29 DIAGNOSIS — Z90710 Acquired absence of both cervix and uterus: Secondary | ICD-10-CM

## 2017-08-29 DIAGNOSIS — Z880 Allergy status to penicillin: Secondary | ICD-10-CM

## 2017-08-29 DIAGNOSIS — R278 Other lack of coordination: Secondary | ICD-10-CM | POA: Diagnosis not present

## 2017-08-29 DIAGNOSIS — L8990 Pressure ulcer of unspecified site, unspecified stage: Secondary | ICD-10-CM

## 2017-08-29 DIAGNOSIS — F3289 Other specified depressive episodes: Secondary | ICD-10-CM | POA: Diagnosis not present

## 2017-08-29 HISTORY — DX: Female pelvic inflammatory disease, unspecified: N73.9

## 2017-08-29 HISTORY — DX: Other specified disorders of kidney and ureter: N28.89

## 2017-08-29 LAB — COMPREHENSIVE METABOLIC PANEL
ALK PHOS: 173 U/L — AB (ref 38–126)
ALT: 24 U/L (ref 14–54)
AST: 16 U/L (ref 15–41)
Albumin: 2.1 g/dL — ABNORMAL LOW (ref 3.5–5.0)
Anion gap: 12 (ref 5–15)
BILIRUBIN TOTAL: 0.5 mg/dL (ref 0.3–1.2)
BUN: 79 mg/dL — AB (ref 6–20)
CALCIUM: 9 mg/dL (ref 8.9–10.3)
CO2: 11 mmol/L — ABNORMAL LOW (ref 22–32)
CREATININE: 4.28 mg/dL — AB (ref 0.44–1.00)
Chloride: 106 mmol/L (ref 101–111)
GFR calc Af Amer: 12 mL/min — ABNORMAL LOW (ref >60)
GFR, EST NON AFRICAN AMERICAN: 10 mL/min — AB (ref >60)
Glucose, Bld: 180 mg/dL — ABNORMAL HIGH (ref 65–99)
Potassium: 3.2 mmol/L — ABNORMAL LOW (ref 3.5–5.1)
Sodium: 129 mmol/L — ABNORMAL LOW (ref 135–145)
TOTAL PROTEIN: 6.8 g/dL (ref 6.5–8.1)

## 2017-08-29 LAB — MAGNESIUM: MAGNESIUM: 1.1 mg/dL — AB (ref 1.7–2.4)

## 2017-08-29 LAB — PREALBUMIN: Prealbumin: 7 mg/dL — ABNORMAL LOW (ref 18–38)

## 2017-08-29 LAB — PHOSPHORUS: Phosphorus: 4.6 mg/dL (ref 2.5–4.6)

## 2017-08-29 MED ORDER — ONDANSETRON HCL 4 MG PO TABS
4.0000 mg | ORAL_TABLET | Freq: Three times a day (TID) | ORAL | Status: DC
  Administered 2017-08-29 – 2017-08-30 (×2): 4 mg via ORAL
  Filled 2017-08-29 (×2): qty 1

## 2017-08-29 MED ORDER — LACTATED RINGERS IV SOLN
INTRAVENOUS | Status: DC
  Administered 2017-08-29 – 2017-08-30 (×2): via INTRAVENOUS

## 2017-08-29 MED ORDER — ONDANSETRON 4 MG PO TBDP: 4.0000 mg | ORAL_TABLET | ORAL | Status: DC | PRN

## 2017-08-29 MED ORDER — GUAIFENESIN-DM 100-10 MG/5ML PO SYRP
10.0000 mL | ORAL_SOLUTION | ORAL | Status: DC | PRN
  Administered 2017-09-01: 10 mL via ORAL
  Filled 2017-08-29: qty 10

## 2017-08-29 MED ORDER — ENSURE SURGERY PO LIQD
237.0000 mL | Freq: Two times a day (BID) | ORAL | Status: DC
  Administered 2017-08-30 – 2017-08-31 (×2): 237 mL via ORAL
  Filled 2017-08-29 (×7): qty 237

## 2017-08-29 MED ORDER — MAGIC MOUTHWASH
15.0000 mL | Freq: Four times a day (QID) | ORAL | Status: DC | PRN
  Filled 2017-08-29: qty 15

## 2017-08-29 MED ORDER — PROCHLORPERAZINE EDISYLATE 5 MG/ML IJ SOLN: 5.0000 mg | INTRAMUSCULAR | Status: DC | PRN

## 2017-08-29 MED ORDER — LIP MEDEX EX OINT
1.0000 "application " | TOPICAL_OINTMENT | Freq: Two times a day (BID) | CUTANEOUS | Status: DC
  Administered 2017-08-29 – 2017-09-25 (×50): 1 via TOPICAL
  Filled 2017-08-29 (×9): qty 7

## 2017-08-29 MED ORDER — HYDROCORTISONE 2.5 % RE CREA
1.0000 "application " | TOPICAL_CREAM | Freq: Four times a day (QID) | RECTAL | Status: DC | PRN
  Filled 2017-08-29: qty 28.35

## 2017-08-29 MED ORDER — SIMETHICONE 80 MG PO CHEW: 40.0000 mg | CHEWABLE_TABLET | Freq: Four times a day (QID) | ORAL | Status: DC | PRN

## 2017-08-29 MED ORDER — LOPERAMIDE HCL 2 MG PO CAPS: 2.0000 mg | ORAL_CAPSULE | Freq: Three times a day (TID) | ORAL | Status: DC | PRN

## 2017-08-29 MED ORDER — HYDROMORPHONE HCL 1 MG/ML IJ SOLN: 0.5000 mg | INTRAMUSCULAR | Status: DC | PRN

## 2017-08-29 MED ORDER — MENTHOL 3 MG MT LOZG: 1.0000 | LOZENGE | OROMUCOSAL | Status: DC | PRN

## 2017-08-29 MED ORDER — METOPROLOL TARTRATE 5 MG/5ML IV SOLN: 5.0000 mg | Freq: Four times a day (QID) | INTRAVENOUS | Status: DC | PRN

## 2017-08-29 MED ORDER — PHENOL 1.4 % MT LIQD
1.0000 | OROMUCOSAL | Status: DC | PRN
  Filled 2017-08-29: qty 177

## 2017-08-29 MED ORDER — METOCLOPRAMIDE HCL 5 MG/ML IJ SOLN
10.0000 mg | Freq: Four times a day (QID) | INTRAMUSCULAR | Status: DC | PRN
  Administered 2017-08-29: 10 mg via INTRAVENOUS
  Filled 2017-08-29: qty 2

## 2017-08-29 MED ORDER — HYDROCORTISONE 1 % EX CREA
1.0000 "application " | TOPICAL_CREAM | Freq: Three times a day (TID) | CUTANEOUS | Status: DC | PRN
  Filled 2017-08-29: qty 28

## 2017-08-29 MED ORDER — LACTATED RINGERS IV BOLUS (SEPSIS)
2000.0000 mL | Freq: Once | INTRAVENOUS | Status: AC
  Administered 2017-08-29: 2000 mL via INTRAVENOUS

## 2017-08-29 MED ORDER — SODIUM CHLORIDE 0.9 % IV SOLN
25.0000 mg | Freq: Four times a day (QID) | INTRAVENOUS | Status: DC | PRN
  Filled 2017-08-29: qty 1

## 2017-08-29 MED ORDER — ACETAMINOPHEN 325 MG PO TABS: 325.0000 mg | ORAL_TABLET | Freq: Four times a day (QID) | ORAL | Status: DC | PRN

## 2017-08-29 MED ORDER — ONDANSETRON HCL 4 MG/2ML IJ SOLN: 4.0000 mg | INTRAMUSCULAR | Status: DC | PRN

## 2017-08-29 MED ORDER — LACTATED RINGERS IV BOLUS (SEPSIS): 1000.0000 mL | Freq: Three times a day (TID) | INTRAVENOUS | Status: DC | PRN

## 2017-08-29 MED ORDER — METHOCARBAMOL 1000 MG/10ML IJ SOLN
1000.0000 mg | Freq: Four times a day (QID) | INTRAVENOUS | Status: DC | PRN
  Filled 2017-08-29: qty 10

## 2017-08-29 MED ORDER — METOCLOPRAMIDE HCL 5 MG PO TABS: 5.0000 mg | ORAL_TABLET | Freq: Four times a day (QID) | ORAL | Status: DC | PRN

## 2017-08-29 MED ORDER — SODIUM CHLORIDE 0.9% FLUSH
10.0000 mL | INTRAVENOUS | Status: AC | PRN
  Administered 2017-08-29: 10 mL

## 2017-08-29 MED ORDER — GABAPENTIN 300 MG PO CAPS
300.0000 mg | ORAL_CAPSULE | Freq: Every day | ORAL | Status: AC
  Administered 2017-08-29 – 2017-08-31 (×3): 300 mg via ORAL
  Filled 2017-08-29 (×3): qty 1

## 2017-08-29 MED ORDER — HEPARIN SOD (PORK) LOCK FLUSH 100 UNIT/ML IV SOLN
250.0000 [IU] | INTRAVENOUS | Status: AC | PRN
  Administered 2017-08-29: 250 [IU]
  Filled 2017-08-29: qty 5

## 2017-08-29 MED ORDER — PSYLLIUM 95 % PO PACK
1.0000 | PACK | Freq: Every day | ORAL | Status: DC
  Administered 2017-08-30 – 2017-09-11 (×8): 1 via ORAL
  Filled 2017-08-29 (×10): qty 1

## 2017-08-29 MED ORDER — ALUM & MAG HYDROXIDE-SIMETH 200-200-20 MG/5ML PO SUSP: 30.0000 mL | Freq: Four times a day (QID) | ORAL | Status: DC | PRN

## 2017-08-29 MED ORDER — ENOXAPARIN SODIUM 30 MG/0.3ML ~~LOC~~ SOLN
30.0000 mg | SUBCUTANEOUS | Status: DC
  Administered 2017-08-29 – 2017-09-24 (×27): 30 mg via SUBCUTANEOUS
  Filled 2017-08-29 (×27): qty 0.3

## 2017-08-29 MED ORDER — LACTATED RINGERS IV SOLN: INTRAVENOUS | Status: DC

## 2017-08-29 NOTE — H&P (Signed)
[name] Documented: 08/24/2017 4:20 PM Location: [facility] Patient #: [id number] DOB: 02-Apr-1956 Married / Language: English / Race: White Female   History of Present Illness [name]; 08/24/2017 5:54 PM) The patient is a 62 year old female presenting for a post-operative visit. Note for "Post-Operative": ` ` ` The patient returns s/p procedure: 04/01/2017:  POST-OPERATIVE DIAGNOSIS:   Recurrent endometrial cancer status post low anterior resection and partial pelvic exeneration  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  1. SURGEON: [name], MD - Primary (Dr Tresa Moore assist) PROCEDURE:  XI ROBOTIC LYSIS OF ADHESIONS x 5 hours XI ROBOTIC COLOSTOMY TAKEDOWN ROBOTIC SEWN COLOANAL ANASTOMOSIS Ileocecal resection with anastomosis Diverting loop ileostomy  2. SURGEON: Alexis Frock, MD - Primary (Dr Johney Maine assist) PROCEDURE:  Cystoscopy with R retrograde and stent exchange. Robotic R ureteral reimplant with psoas bladder hitch.   ` ` ` `  The returns to clinic after surgery in tears, struggling. She again comes today with her husband. She had the ureteral stent removed last week with cystoscopy. Creatinine was 3. She was given 2 L of IV fluids. She felt better for a day. Then she felt worse. Dr. Tresa Moore felt there were no leak and looked really good. Patent anastomosis. She now has no drains, no Foley, no stents.  However she feels like she cannot eat or drink much. Everything tastes discussed [place] suite. Claims she is drinking up to 3 L of liquid. Her husband skeptical that. She does not want to eat much benefit is at least maybe doing have her meals. Crying constantly. Miserable. Sitting at home. Not getting up. Not exercising. Had held off on getting pelvic floor physical therapy because she felt too tired. Has intermittently been taking antidiarrheals but sounds like she stopped everything. She felt like  it made her more nauseated. He ostomy bag about a dozen times a day. Not high volume but it burns and irritates the skin. Frustrating for her. Has some urinary urgency. No foul urine. She's got some rectal drainage. No major bleeding now.  She feels like she is definitely got worse and sees no hope of getting better. Miserable. Wants to go back on IV fluids. Wants to stay at home and have home health done. Husband frustrated but trying to keep a positive face.   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 Owens Shark, RMA; 08/24/2017 4:21 PM) Trimethoprim  Erythromycin *DERMATOLOGICALS*  Adhesive Tape 1"x5yd *MEDICAL DEVICES AND SUPPLIES*  Rash. Cipro *FLUOROQUINOLONES*  Penicillins  Swelling. Sulfa Antibiotics  Rash. Allergies Reconciled   Medication History (Tanisha A. Owens Shark, [place]; 08/24/2017 4:21 PM)  Dilaudid (2MG  Tablet, 1-2 Tablet Oral every six hours, as needed for pain, Taken starting 04/12/2017) Active. Zofran ODT (4MG  Tablet  Disint, 1 to 2 Tablet Disperse Oral every six hours, as needed, Taken starting 05/19/2017) Active. Lomotil (2.5-0.025MG  Tablet, 1-2 Oral every eight hours, as needed for loose BMs, Taken starting 06/21/2017) Active. Synthroid (150MCG Tablet, Oral) Active. Prochlorperazine Maleate (10MG  Tablet, Oral) Active. Promethazine HCl (25MG  Tablet, Oral) Active. Biotin (5MG  Tablet, Oral) Active. Calcium-Magnesium-Vitamin D (Oral) Specific strength unknown - Active. Vitamin D3 (50000UNIT Tablet, Oral once a week) Active. Benadryl Allergy (25MG  Tablet, Oral as needed) Active. Claritin (10MG  Tablet, Oral) Active. Multivitamin (Oral) Active. Omega 3 (Oral) Specific strength unknown - Active. MiraLax (Oral as needed) Active. Illusions AA Breast Prosthesis (1 (one)) Active. [phone] Post-mastectomy camisole - to hold drain tubes and wear during healing.; QTY: 2; Length of use: 3 Months 3 Refills ; L8000 Post-surgical bras - to hold breast prosthesis ; QTY: 6; Length of use:3 Months 3 Refills ; L8020Non-silicone breast prosthesis - for temporary use during healing or for comfort at end of day, lighter weight, cooler for hot weather.; QTY: 1 for single, 2 for bilateral ; Length of use: 6 months Up to 1 refill; Replacement ; Q0086 Silicone Breast Prosthesis - to restore balance and symmetry after breast surgery; QTY: 1 for single, 2 for Bilateral; Length of use: 2 years No refills) Medications Reconciled  Social History [name], MD; 08/24/2017 4:35 PM) Alcohol use  Occasional alcohol use. Caffeine use  Carbonated beverages, Coffee, Tea. No drug use  Tobacco use  Never smoker.  Family History [name], MD; 08/24/2017 4:35 PM) Cancer  Mother. Diabetes Mellitus  Brother, Father. Heart Disease  Brother, Father. Hypertension  Brother, Father. Prostate Cancer  Father.  Pregnancy / Birth History [name], MD; 08/24/2017 4:35 PM) Age at menarche  66 years. Age of  menopause  83-50 Gravida  1 Maternal age  60-25 Para  62  Other Problems [name], MD; 08/24/2017 4:35 PM) Back Pain  Cancer  Cholelithiasis  Diabetes Mellitus  Gastroesophageal Reflux Disease  Hemorrhoids  High blood pressure  Oophorectomy  Bilateral. Other disease, cancer, significant illness  Ovarian Cancer  Thyroid Disease   Vitals (Tanisha A. Brown RMA; 08/24/2017 4:21 PM) 08/24/2017 4:20 PM Weight: 184.6 lb Height: 62in Body Surface Area: 1.85 m Body Mass Index: 33.76 kg/m  Temp.: 98.16F  Pulse: 92 (Regular)  BP: 122/76 (Sitting, Left Arm, Standard)       Physical Exam [name]; 08/24/2017 5:57 PM) General Mental Status-Alert. General Appearance-Not in acute distress. Voice-Normal. Note: Smiling. Well-groomed. Relaxed. Nontoxic. This is the best I have seen her since surgery   Integumentary Global Assessment Normal Exam - Distribution of scalp and body hair is normal. General Characteristics Overall Skin Surface - no rashes and no suspicious lesions.  Head and Neck Head-normocephalic, atraumatic with no lesions or palpable masses. Face Global Assessment - atraumatic, no absence of expression. Neck Global Assessment - no abnormal movements, no decreased range of motion. Trachea-midline. Thyroid Gland Characteristics - non-tender.  Eye Eyeball - Left-Extraocular movements intact, No Nystagmus. Eyeball - Right-Extraocular movements intact, No Nystagmus. Upper Eyelid - Left-No Cyanotic. Upper Eyelid - Right-No Cyanotic.  Chest and Lung Exam Inspection Accessory muscles - No use of accessory muscles in breathing.  Abdomen Note: RLQ ileostomy in place. Gas and thick succus in bag.   Some thinning of skin edema concerning for chronic malnutrition. No breakdown at incisions are  all drains. No more drains or 2. No Foley.   Female Genitourinary Note: No vaginal bleeding or  discharge. Thickening of posterior vaginal wall motion intact. No breakdown or drainage. No wounds. No obvious fistula   Rectal Note: Sphincter tone intact. Anal stump points posteriorly into a very strictured coloanal anastomosis felt around 2.5 cm. Was able to open up with my index finger. Felt soft waxy stool in the neo-rectum. Painful with dilation but then he is here on reexamination.   Peripheral Vascular Upper Extremity Inspection - Left - Not Gangrenous, No Petechiae. Right - Not Gangrenous, No Petechiae.  Neurologic Neurologic evaluation reveals -normal attention span and ability to concentrate, able to name objects and repeat phrases. Appropriate fund of knowledge and normal coordination.  Neuropsychiatric Mental status exam performed with findings of-able to articulate well with normal speech/language, rate, volume and coherence and no evidence of hallucinations, delusions, obsessions or homicidal/suicidal ideation. Orientation-oriented X3.  Musculoskeletal Global Assessment Gait and Station - normal gait and station.  Lymphatic General Lymphatics Description - No Generalized lymphadenopathy.    Assessment & Plan [name]; 08/24/2017 6:03 PM) HIGH OUTPUT ILEOSTOMY (R19.8) Impression: Obvious declined. Very frustrating. I believe she's got severe depression and is withdrawing from interaction and care and hope. I tried offer hope that I also read her the [place] a little bit about noncompliance. I noted I will work to try and get her better but I need her to work with me.  Recurrent diarrhea off antidiarrheals.  Restart IV fluids. 2 L LR Monday Wednesday Friday. Do that for 2 weeks. Once creatinine normal, back down to 1 L every Monday Wednesday Friday.  Lomotil and fiber supplement to help thicken up in slowdown stools.  Serial labs to see if we can get this under control Current Plans Pt Education - [facility] Free Text Education/Instructions: discussed  with patient and provided information. Started Diphenoxylate-Atropine 2.5-0.025 MG/5ML Oral Liquid, 5 Milliliter four times daily, as needed, 200 Milliliter, 08/24/2017, Ref. x5. Local Order: Take to slow down emptying her ileostomy bag to every 4 hours Referred to Interventional Radiology (IR), for evaluation and follow up (Radiology). Urgent. CKD (CHRONIC KIDNEY DISEASE), STAGE III (N18.3) Impression: Worsening creatinine up to 3 last week. Down to 2.5 but she is struggling.  Restart IV fluids and follow.  Follow-up with nephrology.  Readmit if does not improve in the next week or worsens. We'll try and reach out to nephrology if labs not better by Monday. She wants to hold off on going into the hospital of possible ILEOSTOMY IN PLACE (Z93.2) Impression: Loop ileostomy contraction due to necrosis. Functioning.  I recommend she had fiber and Lomotil to thicken and slow down her stools.  I told her no uncertain terms that I cannot take down her loop ileostomy until:  -at least greater than 3 months out from stent removal. That would place it into April. -proof that her nutrition is normal. Albumin >3.5. Prealbumin >15 -proof that her oral intake has improved. -Has stretched out coloanal anastomosis with completion of pelvic floor physical therapy. She tried to delay pelvic floor physical therapy again. I noted she needs to start that now. -has contrast studies disproving any leak or significant stricture. I did caution she is at high risk for having an anastomotic issues that may need time. Hopefully with the help of pelvic floor physical therapy anterior her dilations, this will gradually improve.  It may be that she will not recover from this. I worry  she will do poorly with just loop ileostomy. That would mean trying to re-create a colostomy. That would mean another large surgery again. I would like to avoid that. So would they. Current Plans Follow up with [place] in the office in 2  weeks.  Call us sooner as needed.  Pt Education - [place] (Marysa Wessner): discussed with patient and provided information. S/P COLOSTOMY TAKEDOWN (E52.778) Impression: Start pelvic floor physical therapy. I dilated the stricture back up. She may need Hegar dilators to help this out. She been holding off but I think she needs to reconsider it.  I suspect she will need at least 6 months, perhaps a year, before she is ready for her loop ileostomy takedown. Challenges her poor by mouth intake and dietary control and chronic kidney disease from her ileostomy. But I don't think I can rush this or the takedown will fail LEAKAGE OF URINE FROM URETER (R32) Impression: Foley out without any new leak.  Stent removed without any obvious leak.  CT scan to rule out recurrent fluid collections or other abnormalities.  Hopefully urine frequency well gradually fade away as the bladder stretches back out. ABDOMINAL PAIN (R10.9) Current Plans CT ABDOMEN AND PELVIS W CONTRAST (24235) - STAT (Pt needs CT A/P w/oral contrast only NO IV contrast to eval. abdominal pain,nausea,&vomiting.Hx of bowel obstructions and Hx of urenoma. Pt's creatinine level is 2.5 drawn on 08/23/17.) ENDOMETRIAL CANCER (C54.1) Impression: Recurrent endometrial cancer requiring low anterior resection and partial vaginectomy with ureteral reimplantation and bladder repair.  No evidence of recurrence several years out. Nothing by exam according to her gynecological oncologist. Recheck CT scan to be sure.    Signed by [name], MD (08/24/2017 6:05 PM)  Addendum: CT scan showed no obstruction mass or tumor.  No recurrent urinoma.  Encouraging.  Creatinine initially improved to 2.4.  However, after 2 rounds infusion her creatinine is worse.  Therefore I think she needs to be admitted with nephrology and wound ostomy and other consultations to try and turn this around.  [name], M.D., F.A.C.S. Gastrointestinal and  Minimally Invasive Surgery [name] [address] [place], [place] [address] [phone] Main / Paging

## 2017-08-29 NOTE — Progress Notes (Signed)
Lab calling to report that patient had BMP ordered earlier today at 12:01.  CMET was also order later today.  Lab will cancel BMP orders due to labs are included within CMET.  Burna Forts, BSN, RN-BC (MSN [place]

## 2017-08-29 NOTE — Discharge Instructions (Signed)
Patient received 2 Liter Bolus of Lactated Ringer. Labs drawn and PICC line flushed.

## 2017-08-29 NOTE — Progress Notes (Signed)
Diagnosis: Dehydration  Physician: Johney Maine, MD  Procedure: Ms. Valentina Gu came into Patient [place] and received 2L LR over 4 hours per order via PICC line without complications.  Lab were also obtained.  Discharge instructions reviewed and understanding verbalized. Patient alert, oriented, verbal and ambulatory with walker at time of discharge.  Discharged with brother.

## 2017-08-30 ENCOUNTER — Inpatient Hospital Stay (HOSPITAL_COMMUNITY): Payer: BLUE CROSS/BLUE SHIELD

## 2017-08-30 DIAGNOSIS — Z932 Ileostomy status: Secondary | ICD-10-CM

## 2017-08-30 DIAGNOSIS — F4323 Adjustment disorder with mixed anxiety and depressed mood: Secondary | ICD-10-CM

## 2017-08-30 DIAGNOSIS — Z8543 Personal history of malignant neoplasm of ovary: Secondary | ICD-10-CM

## 2017-08-30 DIAGNOSIS — E1122 Type 2 diabetes mellitus with diabetic chronic kidney disease: Secondary | ICD-10-CM

## 2017-08-30 DIAGNOSIS — E039 Hypothyroidism, unspecified: Secondary | ICD-10-CM

## 2017-08-30 DIAGNOSIS — E440 Moderate protein-calorie malnutrition: Secondary | ICD-10-CM

## 2017-08-30 DIAGNOSIS — N179 Acute kidney failure, unspecified: Secondary | ICD-10-CM

## 2017-08-30 DIAGNOSIS — R198 Other specified symptoms and signs involving the digestive system and abdomen: Secondary | ICD-10-CM

## 2017-08-30 LAB — URINALYSIS, ROUTINE W REFLEX MICROSCOPIC
Bilirubin Urine: NEGATIVE
GLUCOSE, UA: NEGATIVE mg/dL
Ketones, ur: NEGATIVE mg/dL
NITRITE: NEGATIVE
Protein, ur: 100 mg/dL — AB
Specific Gravity, Urine: 1.006 (ref 1.005–1.030)
pH: 5 (ref 5.0–8.0)

## 2017-08-30 LAB — TSH: TSH: 2.408 u[IU]/mL (ref 0.350–4.500)

## 2017-08-30 LAB — BASIC METABOLIC PANEL
ANION GAP: 13 (ref 5–15)
BUN: 81 mg/dL — ABNORMAL HIGH (ref 6–20)
CALCIUM: 8.8 mg/dL — AB (ref 8.9–10.3)
CO2: 10 mmol/L — ABNORMAL LOW (ref 22–32)
CREATININE: 4.24 mg/dL — AB (ref 0.44–1.00)
Chloride: 107 mmol/L (ref 101–111)
GFR, EST AFRICAN AMERICAN: 12 mL/min — AB (ref >60)
GFR, EST NON AFRICAN AMERICAN: 10 mL/min — AB (ref >60)
GLUCOSE: 182 mg/dL — AB (ref 65–99)
Potassium: 3 mmol/L — ABNORMAL LOW (ref 3.5–5.1)
Sodium: 130 mmol/L — ABNORMAL LOW (ref 135–145)

## 2017-08-30 LAB — OSMOLALITY: Osmolality: 299 mOsm/kg — ABNORMAL HIGH (ref 275–295)

## 2017-08-30 LAB — CBC WITH DIFFERENTIAL/PLATELET
Basophils Absolute: 0 10*3/uL (ref 0.0–0.1)
Basophils Relative: 0 %
EOS ABS: 0.1 10*3/uL (ref 0.0–0.7)
Eosinophils Relative: 2 %
HCT: 26.8 % — ABNORMAL LOW (ref 36.0–46.0)
HEMOGLOBIN: 9.4 g/dL — AB (ref 12.0–15.0)
LYMPHS PCT: 15 %
Lymphs Abs: 0.4 10*3/uL — ABNORMAL LOW (ref 0.7–4.0)
MCH: 29.6 pg (ref 26.0–34.0)
MCHC: 35.1 g/dL (ref 30.0–36.0)
MCV: 84.3 fL (ref 78.0–100.0)
MONO ABS: 0.5 10*3/uL (ref 0.1–1.0)
Monocytes Relative: 19 %
NEUTROS ABS: 1.8 10*3/uL (ref 1.7–7.7)
Neutrophils Relative %: 64 %
PLATELETS: 222 10*3/uL (ref 150–400)
RBC: 3.18 MIL/uL — ABNORMAL LOW (ref 3.87–5.11)
RDW: 15 % (ref 11.5–15.5)
WBC: 2.8 10*3/uL — ABNORMAL LOW (ref 4.0–10.5)

## 2017-08-30 LAB — MAGNESIUM: MAGNESIUM: 1.1 mg/dL — AB (ref 1.7–2.4)

## 2017-08-30 LAB — GLUCOSE, CAPILLARY
GLUCOSE-CAPILLARY: 158 mg/dL — AB (ref 65–99)
Glucose-Capillary: 159 mg/dL — ABNORMAL HIGH (ref 65–99)
Glucose-Capillary: 241 mg/dL — ABNORMAL HIGH (ref 65–99)
Glucose-Capillary: 218 mg/dL — ABNORMAL HIGH (ref 65–99)
Glucose-Capillary: 181 mg/dL — ABNORMAL HIGH (ref 65–99)

## 2017-08-30 LAB — OSMOLALITY, URINE: OSMOLALITY UR: 253 mosm/kg — AB (ref 300–900)

## 2017-08-30 LAB — CREATININE, URINE, RANDOM: Creatinine, Urine: 43.98 mg/dL

## 2017-08-30 LAB — SODIUM, URINE, RANDOM: Sodium, Ur: 34 mmol/L

## 2017-08-30 MED ORDER — PRENATAL MULTIVITAMIN CH
1.0000 | ORAL_TABLET | Freq: Every day | ORAL | Status: DC
  Administered 2017-08-30 – 2017-09-25 (×26): 1 via ORAL
  Filled 2017-08-30 (×27): qty 1

## 2017-08-30 MED ORDER — PROMETHAZINE HCL 25 MG/ML IJ SOLN: 25.0000 mg | Freq: Four times a day (QID) | INTRAMUSCULAR | Status: DC | PRN

## 2017-08-30 MED ORDER — TBO-FILGRASTIM 480 MCG/0.8ML ~~LOC~~ SOSY: 480.0000 ug | PREFILLED_SYRINGE | SUBCUTANEOUS | Status: DC

## 2017-08-30 MED ORDER — CIPROFLOXACIN IN D5W 200 MG/100ML IV SOLN
200.0000 mg | INTRAVENOUS | Status: DC
  Administered 2017-08-30 – 2017-09-05 (×7): 200 mg via INTRAVENOUS
  Filled 2017-08-30 (×8): qty 100

## 2017-08-30 MED ORDER — FAMOTIDINE 20 MG PO TABS
20.0000 mg | ORAL_TABLET | Freq: Every day | ORAL | Status: DC
  Administered 2017-08-30 – 2017-09-25 (×26): 20 mg via ORAL
  Filled 2017-08-30 (×27): qty 1

## 2017-08-30 MED ORDER — PROCHLORPERAZINE EDISYLATE 5 MG/ML IJ SOLN
10.0000 mg | INTRAMUSCULAR | Status: DC | PRN
  Administered 2017-08-31 – 2017-09-15 (×5): 10 mg via INTRAVENOUS
  Filled 2017-08-30 (×5): qty 2

## 2017-08-30 MED ORDER — VITAMIN D3 25 MCG (1000 UNIT) PO TABS
10000.0000 [IU] | ORAL_TABLET | ORAL | Status: DC
  Administered 2017-09-04: 10000 [IU] via ORAL
  Filled 2017-08-30 (×4): qty 10

## 2017-08-30 MED ORDER — POTASSIUM CHLORIDE 10 MEQ/100ML IV SOLN
10.0000 meq | INTRAVENOUS | Status: AC
  Administered 2017-08-30 (×2): 10 meq via INTRAVENOUS
  Filled 2017-08-30 (×3): qty 100

## 2017-08-30 MED ORDER — ANASTROZOLE 1 MG PO TABS
1.0000 mg | ORAL_TABLET | Freq: Every day | ORAL | Status: DC
  Administered 2017-08-30 – 2017-09-25 (×28): 1 mg via ORAL
  Filled 2017-08-30 (×28): qty 1

## 2017-08-30 MED ORDER — SODIUM BICARBONATE 8.4 % IV SOLN: INTRAVENOUS | Status: DC

## 2017-08-30 MED ORDER — MAGNESIUM SULFATE 2 GM/50ML IV SOLN
2.0000 g | Freq: Once | INTRAVENOUS | Status: AC
  Administered 2017-08-30: 2 g via INTRAVENOUS
  Filled 2017-08-30: qty 50

## 2017-08-30 MED ORDER — POLYETHYL GLYCOL-PROPYL GLYCOL 0.4-0.3 % OP GEL: Freq: Every day | OPHTHALMIC | Status: DC

## 2017-08-30 MED ORDER — ROSUVASTATIN CALCIUM 10 MG PO TABS
10.0000 mg | ORAL_TABLET | Freq: Every evening | ORAL | Status: DC
  Administered 2017-08-30 – 2017-09-24 (×25): 10 mg via ORAL
  Filled 2017-08-30 (×25): qty 1

## 2017-08-30 MED ORDER — INSULIN ASPART 100 UNIT/ML ~~LOC~~ SOLN
0.0000 [IU] | Freq: Three times a day (TID) | SUBCUTANEOUS | Status: DC
  Administered 2017-08-30 (×2): 5 [IU] via SUBCUTANEOUS
  Administered 2017-08-31: 11 [IU] via SUBCUTANEOUS
  Administered 2017-08-31: 3 [IU] via SUBCUTANEOUS
  Administered 2017-08-31: 5 [IU] via SUBCUTANEOUS
  Administered 2017-09-01: 15 [IU] via SUBCUTANEOUS
  Administered 2017-09-01: 5 [IU] via SUBCUTANEOUS

## 2017-08-30 MED ORDER — DIPHENOXYLATE-ATROPINE 2.5-0.025 MG PO TABS
1.0000 | ORAL_TABLET | Freq: Three times a day (TID) | ORAL | Status: DC
  Administered 2017-08-30: 2 via ORAL
  Administered 2017-08-30: 1 via ORAL
  Administered 2017-08-31: 2 via ORAL
  Administered 2017-08-31: 1 via ORAL
  Administered 2017-08-31 – 2017-09-01 (×3): 2 via ORAL
  Administered 2017-09-01: 1 via ORAL
  Administered 2017-09-02: 2 via ORAL
  Administered 2017-09-02: 1 via ORAL
  Filled 2017-08-30: qty 1
  Filled 2017-08-30 (×2): qty 2
  Filled 2017-08-30: qty 1
  Filled 2017-08-30 (×4): qty 2
  Filled 2017-08-30: qty 1
  Filled 2017-08-30: qty 2
  Filled 2017-08-30: qty 1

## 2017-08-30 MED ORDER — SODIUM CHLORIDE 0.9 % IV SOLN
INTRAVENOUS | Status: DC
  Administered 2017-08-30: 08:00:00 via INTRAVENOUS

## 2017-08-30 MED ORDER — LEVOTHYROXINE SODIUM 75 MCG PO TABS
150.0000 ug | ORAL_TABLET | Freq: Every day | ORAL | Status: DC
  Administered 2017-08-30 – 2017-09-25 (×27): 150 ug via ORAL
  Filled 2017-08-30 (×28): qty 2

## 2017-08-30 MED ORDER — LACTATED RINGERS IV SOLN
INTRAVENOUS | Status: DC
  Administered 2017-08-30: 09:00:00 via INTRAVENOUS
  Administered 2017-08-31: 1000 mL via INTRAVENOUS

## 2017-08-30 MED ORDER — ACETAMINOPHEN 500 MG PO TABS
500.0000 mg | ORAL_TABLET | Freq: Four times a day (QID) | ORAL | Status: DC | PRN
  Administered 2017-09-01: 1000 mg via ORAL
  Filled 2017-08-30: qty 2

## 2017-08-30 MED ORDER — SODIUM CHLORIDE 0.9 % IV SOLN
25.0000 mg | Freq: Four times a day (QID) | INTRAVENOUS | Status: DC | PRN
  Filled 2017-08-30: qty 1

## 2017-08-30 MED ORDER — METOCLOPRAMIDE HCL 5 MG PO TABS
5.0000 mg | ORAL_TABLET | Freq: Three times a day (TID) | ORAL | Status: DC
  Administered 2017-08-30 – 2017-09-11 (×33): 5 mg via ORAL
  Filled 2017-08-30 (×34): qty 1

## 2017-08-30 MED ORDER — FAMOTIDINE 20 MG PO TABS: 10.0000 mg | ORAL_TABLET | Freq: Every day | ORAL | Status: DC | PRN

## 2017-08-30 MED ORDER — INSULIN ASPART 100 UNIT/ML ~~LOC~~ SOLN
0.0000 [IU] | Freq: Three times a day (TID) | SUBCUTANEOUS | Status: DC
Start: 2017-08-30 — End: 2017-08-30
  Administered 2017-08-30: 3 [IU] via SUBCUTANEOUS

## 2017-08-30 MED ORDER — INSULIN ASPART 100 UNIT/ML ~~LOC~~ SOLN
0.0000 [IU] | Freq: Every day | SUBCUTANEOUS | Status: DC
  Administered 2017-08-31: 4 [IU] via SUBCUTANEOUS

## 2017-08-30 MED ORDER — POLYVINYL ALCOHOL 1.4 % OP SOLN
1.0000 [drp] | Freq: Every day | OPHTHALMIC | Status: DC
  Administered 2017-08-30 – 2017-09-24 (×26): 1 [drp] via OPHTHALMIC
  Filled 2017-08-30: qty 15

## 2017-08-30 MED ORDER — HYDROMORPHONE HCL 1 MG/ML IJ SOLN: 1.0000 mg | INTRAMUSCULAR | Status: DC | PRN

## 2017-08-30 MED ORDER — SODIUM CHLORIDE 0.9% FLUSH
10.0000 mL | INTRAVENOUS | Status: DC | PRN
  Administered 2017-08-30: 10 mL
  Administered 2017-08-31: 20 mL
  Administered 2017-09-03: 10 mL
  Administered 2017-09-06: 20 mL
  Administered 2017-09-08 – 2017-09-14 (×5): 10 mL
  Administered 2017-09-15: 20 mL
  Administered 2017-09-16 – 2017-09-21 (×2): 10 mL
  Administered 2017-09-21: 20 mL
  Filled 2017-08-30 (×13): qty 40

## 2017-08-30 MED ORDER — LORATADINE 10 MG PO TABS
10.0000 mg | ORAL_TABLET | Freq: Every morning | ORAL | Status: DC
  Administered 2017-08-30 – 2017-09-01 (×3): 10 mg via ORAL
  Filled 2017-08-30 (×3): qty 1

## 2017-08-30 MED ORDER — BIOTIN 5 MG PO TABS: 5.0000 mg | ORAL_TABLET | Freq: Every morning | ORAL | Status: DC

## 2017-08-30 MED ORDER — SODIUM BICARBONATE 8.4 % IV SOLN
INTRAVENOUS | Status: DC
  Administered 2017-08-30 – 2017-09-01 (×3): via INTRAVENOUS
  Filled 2017-08-30 (×5): qty 150

## 2017-08-30 MED ORDER — ALUM & MAG HYDROXIDE-SIMETH 200-200-20 MG/5ML PO SUSP: 30.0000 mL | Freq: Four times a day (QID) | ORAL | Status: DC | PRN

## 2017-08-30 NOTE — Consult Note (Signed)
[facility] Consultation Note Requesting Physician:  Dr. Johney Maine Primary Nephrologist: Dr. 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 Had her stent removed 08/17/17.   Baseline creatinine around 1 as recently as 06/2017 (last seen by Dr Hollie Salk 06/2017 no labs done at that time).   At time of stent removal creatinine was up to 3.   Has had recurrent issues with volume depletion requiring IVF administration (as recently as yesterday). Was admitted due to creatinine elevation to 4.28. Despite IVF yesterday not much change, 4.24 today and we are asked to see. Pt reports usual large volumes of output (from her description estimate a liter+/day), has had sensation of dyspnea but no swelling. + dizziness and lightheadedness with standing. Has continued to take her metformin PTA. Due to severe reflux sx started herself back on PPI as outpt as well.    Creatinine, Ser  Date/Time Value Ref Range Status  08/30/2017 06:23 AM 4.24 (H) 0.44 - 1.00 mg/dL Final  08/29/2017 03:50 PM 4.28 (H) 0.44 - 1.00 mg/dL Final  08/17/2017 11:01 AM 3.00 (H) 0.44 - 1.00 mg/dL Final  06/22/2017 10:45 AM 1.08 (H) 0.44 - 1.00 mg/dL Final  06/11/2017 04:43 PM 1.01 (H) 0.44 - 1.00 mg/dL Final  05/27/2017 05:24 PM 1.34 (H) 0.44 - 1.00 mg/dL Final  04/23/2017 04:31 AM 0.91 0.44 - 1.00 mg/dL Final  04/22/2017 04:30 AM 1.03 (H) 0.44 - 1.00 mg/dL Final  04/21/2017 04:16 AM 0.85 0.44 - 1.00 mg/dL Final  04/20/2017 05:10 AM 0.80 0.44 - 1.00 mg/dL Final  04/19/2017 04:45 AM 0.82 0.44 - 1.00 mg/dL Final  04/18/2017 04:27 AM 0.79 0.44 - 1.00 mg/dL Final  04/17/2017 05:29 AM 0.90 0.44 - 1.00 mg/dL Final   04/16/2017 12:47 AM 1.13 (H) 0.44 - 1.00 mg/dL Final  12/04/2014 08:50 AM 0.72 0.44 - 1.00 mg/dL Final  12/03/2014 04:08 AM 0.80 0.44 - 1.00 mg/dL Final  12/02/2014 05:31 PM 0.67 0.44 - 1.00 mg/dL Final  11/24/2014 05:46 AM 0.75 0.50 - 1.10 mg/dL Final  11/23/2014 05:14 AM 0.52 0.50 - 1.10 mg/dL Final  11/22/2014 04:35 AM 0.50 0.50 - 1.10 mg/dL Final  11/21/2014 03:32 AM 0.64 0.50 - 1.10 mg/dL Final     Past Medical History:  Diagnosis Date  . Adrenal adenoma, left 02/08/2016   CT: stable benign  . Anemia in neoplastic disease   . Benign essential HTN   . 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 Cancer of corpus uteri, except isthmus [facility])  oncologist-- dr Denman [name] and dr Alvy Bimler    10-15-2004  dx endometroid endometrial and ovarian cancer s/p  chemotheapy and surgery(TAH w/ BSO) :  recurrent 11-19-2014 post pelvic surgery and radiation 01-29-2015 to 03-10-2015  . Chronic idiopathic neutropenia (HCC)    presumed related to chemotherapy March 2006--- followed by dr Alvy Bimler (treatment w/ G-CSF injections  . Chronic nausea   . Chronic pain    perineal/ anal  area from bladder pad irritates skin , right flank pain  . CKD stage G2/A3,  GFR 60-89 and albumin creatinine ratio >300 mg/g    nephrologist-  dr Madelon Lips  . Diabetic retinopathy, background ([place])   . Difficult intravenous access    small veins--- hx PICC lines  . DM type 2 (diabetes mellitus, type 2) ([place])    monitored by dr Legrand Como altheimer  . Dysuria   . Environmental and seasonal allergies   . Fatty liver 02/08/2016   CT  . Generalized muscle weakness   . GERD (gastroesophageal reflux disease)   . Hiatal hernia   . History of abdominal abscess 04/16/2017   post surgery 04-01-2017  --- resolved 10/ 2018  . History of gastric  polyp    2014  duodenum  . History of ileus 04/16/2017   resolved w/ no surgical intervention  . History of radiation therapy    01-29-2015 to 03-10-2015  pelvis 50.4Gy  . Hypothyroidism    monitored by dr Legrand Como altheimer  . Ileostomy in place [facility]) 04/01/2017   created at same time colostomy takedown.  . Lower urinary tract symptoms (LUTS)    urge urinary  incontinence  . Mixed dyslipidemia   . Multiple thyroid nodules    Managed by Dr. Harlow Asa  . PONV (postoperative nausea and vomiting)    "scopolamine patch works for me"  . Radiation-induced dermatitis    contact dermatitis , radiation completed, rash only on ankles now.  . Seasonal allergies   . Ureteral stricture, right UROLOGIT-  DR [facility]   CHRONIC--  TREATMENT URETERAL STENT  . Vitamin D deficiency   . Wears glasses       Past Surgical History:  Procedure Laterality Date  . APPENDECTOMY    . BREAST RECONSTRUCTION WITH PLACEMENT OF TISSUE EXPANDER AND FLEX HD (ACELLULAR HYDRATED DERMIS) Bilateral 12/11/2015   Procedure: BILATERAL BREAST RECONSTRUCTION WITH PLACEMENT OF TISSUE EXPANDERS;  Surgeon: Irene Limbo, MD;  Location: [place];  Service: Plastics;  Laterality: Bilateral;  . COLONOSCOPY WITH PROPOFOL N/A 08/21/2013   Procedure: COLONOSCOPY WITH PROPOFOL;  Surgeon: Cleotis Nipper, MD;  [facility];  Service: Endoscopy;  Laterality: N/A;  . COLOSTOMY TAKEDOWN N/A 12/04/2014   Procedure: LAPROSCOPIC LYSIS OF ADHESIONS, SPLENIC MOBILIZATION, RELOCATION OF COLOSTOMY, DEBRIDEMENT INITIAL COLOSTOMY SITE;  Surgeon: Michael [name], MD;  [facility];  Service: General;  Laterality: N/A;  . CYSTOGRAM N/A 06/01/2017   Procedure: CYSTOGRAM;  Surgeon: Alexis Frock, MD;  [facility];  Service: Urology;  Laterality: N/A;  . CYSTOSCOPY W/ RETROGRADES Right 11/21/2015   Procedure: CYSTOSCOPY WITH RETROGRADE PYELOGRAM;  Surgeon: Alexis Frock, MD;  [facility];  Service: Urology;  Laterality: Right;  .  CYSTOSCOPY W/ URETERAL STENT PLACEMENT Right 11/21/2015   Procedure: CYSTOSCOPY WITH STENT REPLACEMENT;  Surgeon: Alexis Frock, MD;  [facility];  Service: Urology;  Laterality: Right;  . CYSTOSCOPY W/ URETERAL STENT PLACEMENT Right 03/10/2016   Procedure: CYSTOSCOPY WITH STENT REPLACEMENT;  Surgeon: Alexis Frock, MD;  Location: [facility];  Service: Urology;  Laterality: Right;  . CYSTOSCOPY W/ URETERAL STENT PLACEMENT Right 06/30/2016   Procedure: CYSTOSCOPY WITH RETROGRADE PYELOGRAM/URETERAL STENT EXCHANGE;  Surgeon: Alexis Frock, MD;  Location: [facility];  Service: Urology;  Laterality: Right;  . CYSTOSCOPY W/ URETERAL STENT PLACEMENT N/A 06/01/2017   Procedure: CYSTOSCOPY WITH EXAM UNDER ANESTHESIA;  Surgeon: Alexis Frock, MD;  [facility];  Service: Urology;  Laterality: N/A;  . CYSTOSCOPY W/ URETERAL STENT PLACEMENT Right 08/17/2017   Procedure: CYSTOSCOPY WITH RETROGRADE PYELOGRAM/URETERAL STENT REMOVAL;  Surgeon: Alexis Frock, MD;  Location: [facility];  Service: Urology;  Laterality: Right;  . CYSTOSCOPY WITH RETROGRADE PYELOGRAM, URETEROSCOPY AND STENT PLACEMENT Right 03/20/2015   Procedure: CYSTOSCOPY WITH RETROGRADE PYELOGRAM, URETEROSCOPY WITH BALLOON DILATION AND STENT PLACEMENT ON RIGHT;  Surgeon: Alexis Frock, MD;  Location: [facility];  Service: Urology;  Laterality: Right;  . CYSTOSCOPY WITH RETROGRADE PYELOGRAM, URETEROSCOPY AND STENT PLACEMENT Right 05/02/2015   Procedure: CYSTOSCOPY WITH RIGHT RETROGRADE PYELOGRAM,  DIAGNOSTIC URETEROSCOPY AND STENT PULL ;  Surgeon: Alexis Frock, MD;  Location[facility];  Service: Urology;  Laterality: Right;  . CYSTOSCOPY WITH RETROGRADE PYELOGRAM, URETEROSCOPY AND STENT PLACEMENT Right 09/05/2015   Procedure: CYSTOSCOPY WITH RETROGRADE PYELOGRAM,  AND STENT PLACEMENT;  Surgeon: Alexis Frock, MD;  [facility];  Service: Urology;   Laterality: Right;  . CYSTOSCOPY WITH RETROGRADE PYELOGRAM, URETEROSCOPY AND STENT PLACEMENT Right 04/01/2017   Procedure: CYSTOSCOPY WITH RETROGRADE PYELOGRAM, URETEROSCOPY AND STENT PLACEMENT;  Surgeon: Alexis Frock, MD;  [facility];  Service: Urology;  Laterality: Right;  . CYSTOSCOPY WITH STENT PLACEMENT Right 10/27/2016   Procedure: CYSTOSCOPY WITH STENT CHANGE and right retrograde pyelogram;  Surgeon: Alexis Frock, MD;  Location: [facility];  Service: Urology;  Laterality: Right;  . EUS N/A 10/02/2014   Procedure: LOWER ENDOSCOPIC ULTRASOUND (EUS);  Surgeon: Arta Silence, MD;  Location: [facility];  Service: Endoscopy;  Laterality: N/A;  . EXCISION SOFT TISSUE MASS RIGHT FOREMAN  12-08-2006  . EYE SURGERY  as child   pytosis of eyelids repair  . INCISION AND DRAINAGE OF WOUND Bilateral 12/26/2015   Procedure: DEBRIDEMENT OF BILATERAL MASTECTOMY FLAPS;  Surgeon: Irene Limbo, MD;  Location: [place];  Service: Plastics;  Laterality: Bilateral;  . IR CV LINE INJECTION  05/31/2017  . IR FLUORO GUIDE CV LINE LEFT  05/31/2017  . IR FLUORO GUIDE CV LINE RIGHT  04/06/2017  . IR FLUORO GUIDE CV MIDLINE PICC RIGHT  05/30/2017  . IR RADIOLOGIST EVAL & MGMT  05/03/2017  . IR US GUIDE VASC ACCESS LEFT  05/31/2017  . IR US GUIDE VASC ACCESS RIGHT  04/06/2017  . IR US GUIDE VASC ACCESS RIGHT  05/30/2017  . LAPAROSCOPIC CHOLECYSTECTOMY  1990  . LIPOSUCTION WITH LIPOFILLING Bilateral 04/16/2016   Procedure: LIPOSUCTION WITH LIPOFILLING TO BILATERAL CHEST;  Surgeon: Irene Limbo, MD;  Location: [place];  Service: Plastics;  Laterality: Bilateral;  . MASTECTOMY W/ SENTINEL NODE BIOPSY Bilateral 12/11/2015   Procedure: RIGHT PROPHYLACTIC MASTECTOMY, LEFT TOTAL MASTECTOMY WITH LEFT AXILLARY SENTINEL LYMPH NODE BIOPSY;  Surgeon: Stark Klein, MD;  Location: [place];  Service: General;  Laterality: Bilateral;  . OSTOMY N/A 11/19/2014   Procedure:  OSTOMY;  Surgeon: Michael [name], MD;  [facility];  Service: General;  Laterality: N/A;  . PROCTOSCOPY N/A 04/01/2017   Procedure: RIDGE PROCTOSCOPY;  Surgeon: Michael [name], MD;  [facility];  Service: General;  Laterality: N/A;  . REMOVAL OF BILATERAL TISSUE EXPANDERS WITH PLACEMENT OF BILATERAL BREAST IMPLANTS Bilateral 04/16/2016   Procedure: REMOVAL OF BILATERAL TISSUE EXPANDERS WITH PLACEMENT OF BILATERAL BREAST IMPLANTS;  Surgeon: Irene Limbo, MD;  Location: [place];  Service: Plastics;  Laterality: Bilateral;  . ROBOTIC ASSISTED LAP VAGINAL HYSTERECTOMY N/A 11/19/2014   Procedure: ROBOTIC LYSIS OF ADHESIONS, CONVERTED TO LAPAROTOMY RADICAL UPPER VAGINECTOMY,LOW ANTERIOR BOWEL RESECTION, COLOSTOMY, BILATERAL URETERAL STENT PLACEMENT AND CYSTONOMY CLOSURE;  Surgeon: Everitt Amber, MD;  [facility];  Service: Gynecology;  Laterality: N/A;  . TISSUE EXPANDER FILLING Bilateral 12/26/2015   Procedure: EXPANSION OF BILATERAL CHEST TISSUE EXPANDERS (60 mL- Right; 75 mL- Left);  Surgeon: Irene Limbo, MD;  Location: [place];  Service: Plastics;  Laterality: Bilateral;  . TONSILLECTOMY    . TOTAL ABDOMINAL HYSTERECTOMY  March 2006   [facility]   and Bilateral Salpingoophorectomy/  staging for Ovarian cancer/  an  . XI ROBOTIC ASSISTED LOWER ANTERIOR RESECTION N/A 04/01/2017   Procedure: XI ROBOTIC VS LAPAROSCOPIC COLOSTOMY TAKEDOWN WITH LYSIS OF ADHESIONS.;  Surgeon: Michael [name], MD;  [facility];  Service: General;  Laterality: N/A;  ERAS PATHWAY     Family History  Problem Relation Age of Onset  . Cancer Mother 77       stomach ca  . Hypertension Mother   . Cancer Father 72       prostate ca  . Diabetes Father   . Heart disease Father        CABG  . Breast cancer Maternal Aunt        dx in her 38s  . Lymphoma Paternal Aunt   . Brain cancer Paternal Grandfather   . Ovarian cancer Other   . Diabetes Sister   . Hypertension Brother  y-10  . Heart disease Brother        CABG  . Diabetes Brother    Social History:  reports that  has never smoked. she has never used smokeless tobacco. She reports that she drinks alcohol. She reports that she does not use drugs.   Allergies  Allergen Reactions  . Penicillins Swelling    Facial swelling/childhood allergy Has patient had a PCN reaction causing immediate rash, facial/tongue/throat swelling, SOB or lightheadedness with hypotension: Yes Has patient had a PCN reaction causing severe rash involving mucus membranes or skin necrosis: Yes Has patient had a PCN reaction that required hospitalization yes Has patient had a PCN reaction occurring within the last 10 years: No If all of the above answers are "NO", then may proceed with Cephalosporin use.   [name] Kitchen Ultram [Tramadol] Hives  . Adhesive [Tape]     blisters  . Cefaclor Rash    Ceclor  . Erythromycin     Gastritis, abd cramps  . Trimethoprim Rash  . Ciprofloxacin Other (See Comments)    Unknown On Dr notes   . Fluconazole Rash  . Oxycodone     " I just feel weird"  . Pectin Rash    Pectin ring for stoma  . Sulfa Antibiotics Rash     Prior to Admission medications   Medication Sig Start Date End Date Taking? Authorizing Provider  anastrozole (ARIMIDEX) 1 MG tablet TAKE 1 TABLET DAILY 12/08/16  Yes Gorsuch, Ni, MD  Biotin 5 MG TABS Take 5 mg by mouth every morning.    Yes [provider]  Cholecalciferol (VITAMIN D3) 10000 UNITS capsule Take 10,000 Units by mouth once a week. Sunday evening's   Yes [provider]  diphenoxylate-atropine (LOMOTIL) 2.5-0.025 MG tablet Take 1-2 tablets by mouth every 8 (eight) hours. TO PREVENT LOOSE BOWEL MOVEMENTS 05/20/17  Yes [provider]  Famotidine-Ca Carb-Mag Hydrox (PEPCID COMPLETE PO) Take 1 tablet by mouth daily as needed (INDIGESTION).   Yes [provider]  filgrastim (NEUPOGEN) 480 MCG/1.6ML injection Inject 1.6 ml under the skin every  5 days for life Patient taking differently: Inject 480 mcg into the skin See admin instructions. Inject 1.6 ml under the skin every 6 days  for life 01/06/17  Yes Gorsuch, Ni, MD  levothyroxine (SYNTHROID, LEVOTHROID) 150 MCG tablet Take 150 mcg by mouth daily before breakfast.    Yes [provider]  loratadine (CLARITIN) 10 MG tablet Take 10 mg by mouth every morning.    Yes [provider]  metFORMIN (GLUCOPHAGE) 1000 MG tablet Take 1,000 mg by mouth 2 (two) times daily with a meal.    Yes [provider]  omega-3 acid ethyl esters (LOVAZA) 1 G capsule Take 1 g by mouth 2 (two) times daily.    Yes [provider]  ondansetron (ZOFRAN-ODT) 4 MG disintegrating tablet Take 4 mg by mouth every 6 (six) hours as needed for nausea or vomiting.  04/12/17  Yes [provider]  Polyethyl Glycol-Propyl Glycol (SYSTANE OP) Place 1 drop into both eyes at bedtime.    Yes [provider]  Prenatal Vit-Fe Fumarate-FA (PRENATAL VITAMIN PO) Take 1 capsule by mouth daily. Takes prenatal because there are no dyes in it   Yes [provider]  prochlorperazine (COMPAZINE) 10 MG tablet Take 1 tablet (10 mg total) by mouth 2 (two) times daily as needed for nausea or vomiting. Patient taking differently: Take 10 mg by mouth 2 (two) times daily as needed for nausea or vomiting.  05/28/17  Yes Virgel Manifold, MD  promethazine (PHENERGAN) 25 MG tablet Take 1 tablet (25 mg total) by mouth every 6 (six) hours as needed for nausea or vomiting. 05/28/17  Yes Virgel Manifold, MD  ranitidine (ZANTAC) 150 MG tablet Take 150 mg by mouth 2 (two) times daily as needed for heartburn.    Yes [provider]  rosuvastatin (CRESTOR) 10 MG tablet Take 10 mg by mouth every evening.    Yes [provider]  silver sulfADIAZINE (SILVADENE) 1 % cream Apply 1 application topically daily. 08/10/17  Yes Shelda Pal, DO  sitaGLIPtin (JANUVIA) 100 MG tablet Take 100  mg by mouth every morning.    Yes [provider]  HYDROcodone-acetaminophen (NORCO/VICODIN) 5-325 MG tablet Take 1 tablet by mouth every 6 (six) hours as needed (Pain). Patient not taking: Reported on 08/29/2017 08/10/17   Shelda Pal, DO    Inpatient medications: . anastrozole  1 mg Oral Daily  . [START ON 09/04/2017] cholecalciferol  10,000 Units Oral Weekly  . diphenoxylate-atropine  1-2 tablet Oral Q8H  . enoxaparin (LOVENOX) injection  30 mg Subcutaneous Q24H  . famotidine  20 mg Oral Daily  . feeding supplement  237 mL Oral BID BM  . gabapentin  300 mg Oral QHS  . insulin aspart  0-15 Units Subcutaneous TID WC  . insulin aspart  0-5 Units Subcutaneous QHS  . levothyroxine  150 mcg Oral QAC breakfast  . lip balm  1 application Topical BID  . loratadine  10 mg Oral q morning - 10a  . metoCLOPramide  5 mg Oral TID AC  . polyvinyl alcohol  1 drop Both Eyes QHS  . prenatal multivitamin  1 tablet Oral Daily  . psyllium  1 packet Oral Daily  . rosuvastatin  10 mg Oral QPM  . [START ON 09/05/2017] Tbo-Filgrastim  480 mcg Subcutaneous Weekly   . sodium chloride 125 mL/hr at 08/30/17 0817  . chlorproMAZINE (THORAZINE) IV    . ciprofloxacin Stopped (08/30/17 1023)  . lactated ringers    . lactated ringers 125 mL/hr at 08/30/17 0923  . methocarbamol (ROBAXIN)  IV     Review of Systems See HPI  Physical Exam:  Blood pressure 110/64, pulse 88, temperature 98.4 F (36.9 C), temperature source Oral, resp. rate 17, height _0  (1.6 m), weight 82.9 kg (182 lb 12.2 oz), SpO2 95 %.  Gen: Pale, anxious, tearful WF Extremely pale Shivering and cannot control that Hyperpneic/deep breathing but not tachypneic Lungs are grossly clear Normal heart sounds S1S2 No S3 No murmur Abd with ileostomy bag just emptied. Mildly tender in RLQ + BS No LE edema Alert, oriented, very emotional and crying  Recent Labs  Lab 08/29/17 1550 08/30/17 0623  NA 129* 130*  K 3.2* 3.0*   CL 106 107  CO2 11* 10*  GLUCOSE 180* 182*  BUN 79* 81*  CREATININE 4.28* 4.24*  CALCIUM 9.0 8.8*  PHOS 4.6  --     Recent Labs  Lab 08/29/17 1550  AST 16  ALT 24  ALKPHOS 173*  BILITOT 0.5  PROT 6.8  ALBUMIN 2.1*    Recent Labs  Lab 08/30/17 0623  WBC 2.8*  NEUTROABS 1.8  HGB 9.4*  HCT 26.8*  MCV 84.3  PLT 222    Recent Labs  Lab 08/29/17 2139 08/30/17 0758  GLUCAP 159* 158*     US Renal  Result Date: 08/30/2017 CLINICAL DATA:  62 year old female with history of acute renal failure. History of ureteral stricture. EXAM: RENAL / URINARY TRACT ULTRASOUND COMPLETE COMPARISON:  No prior renal ultrasound. CT the abdomen and pelvis 08/25/2017. FINDINGS: Right Kidney: Length: 10 cm. Echogenicity within normal limits. No mass or hydronephrosis visualized. 7 mm echogenic focus in the interpolar region of the right kidney with some mild posterior acoustic shadowing, compatible with a calculus. Left Kidney: Length: 14.9 cm. Echogenicity within normal limits. Mild to moderate hydronephrosis. No mass visualized. Echogenic focus measuring 6 mm in the lower pole with some posterior acoustic shadowing, compatible with a calculus. Bladder: Under distended and poorly visualized. IMPRESSION: 1. Small nonobstructive calculi in the collecting systems of both kidneys. 2. Mild to moderate hydronephrosis. Electronically Signed   By: Vinnie Langton M.D.   On: 08/30/2017 09:42   Dg Abd Acute W/chest  Result Date: 08/30/2017 CLINICAL DATA:  Shortness of breath on exertion. EXAM: DG ABDOMEN ACUTE W/ 1V CHEST COMPARISON:  None. FINDINGS: There is no evidence of dilated bowel loops or free intraperitoneal air. Status post cholecystectomy. Surgical clips are seen in the pelvis. Heart size and mediastinal contours are within normal limits. Both lungs are clear. IMPRESSION: No evidence of bowel obstruction or ileus. No acute cardiopulmonary disease. Electronically Signed   By: Marijo Conception, M.D.    On: 08/30/2017 11:26    Background: 62 y.o. year-old complicated PMH DM2, h/o MO with >100 lb weight loss, bilateral mastectomies for breast cancer, chronic neutropenia,  h/o ovarian and endometrial CA, h/o colostomy 2016, w/takedown/loop ileostomy diversion as well as resection of ureteral stricture w/bladder hitch reimplantation all in 03/2017. Had stent removed 08/17/17. Baseline creatinine around 1 as recently as 06/2017 (last seen by Dr Hollie Salk 06/2017 no labs done at that time). At time of stent removal creatinine was up to 3. Has had recurrent issues with volume depletion requiring IVF administration (as recently as yesterday). Was admitted due to creatinine elevation to 4.28. Despite IVF yesterday not much change, 4.24 today and we are asked to see. Renal US today per radiology "mild to moderate hydro" but per Dr. Tresa Moore no new hydro. UA with WBC's/bacteria   Assessment/Recommendations  1. Acute kidney injury - Volume depletion top of the list. 2/2 inadequate intake in  face of high output ileostomy  Radiology calls mild-mod hydro on [place], Dr. Tresa Moore indicates not significant by his view. Whatever this is has been present since at least 08/17/17 (SCr 3 when stent removed) and worsening. Could certainly have ATN from prolonged volume depletion. Getting fluids now, supine BP OK, need to see if improves with euvolemia 1. Trend labs and UOP 2. Continue IVF (but change to isotonic bicarb see below) 2. Metabolic acidosis  - on metformin with AKI PTA likely contributing to acidosis. Probably also contributes to her sensation of dyspnea with clear lungs 1. Change to isotonic bicarbonate at 100 cc/hour 2. CXR just to make sure not a volume related issue 3. Hold metformin (of course) 3. Hypokalemia - poor intake. 1. Check Mg 2. 3 runs IV (careful with low GFR) 4. Anemia - check Fe studies 5. Shivering - check blood and urine cultures (urine looks dirty and has h/o frequent UTI's). Has chronic neutropenia so  may be unable to generate WBC response. Toxic grans on smear. Already getting cipro.    [name],  MD [facility] [phone] pager 08/30/2017, 2:06 PM

## 2017-08-30 NOTE — Progress Notes (Signed)
Patient had infusion center.  They cannot find orders.  Verbal orders for 2 L of lactated Ringer's given.   Order set put in yet again.   Order set placed a second time.  [name], M.D., F.A.C.S. Gastrointestinal and Minimally Invasive Surgery [facility] [address], [place], [place] [address] [phone] Main / Paging

## 2017-08-30 NOTE — Progress Notes (Signed)
Per physical therapy note- (per surgeon notes, would like pt. To have pelvic floor PT)  CSW followed up with patient and siblings at bedside. Patient agreeable to surgeon plan and recommendation.   CSW will sign off.    Kathrin Greathouse, Latanya Presser, MSW Clinical Social Worker  [phone] 08/30/2017  4:36 PM

## 2017-08-30 NOTE — Progress Notes (Signed)
Called in OR after the patient apparently has been at the infusion center for 2 hours.  That they cannot find order set orders. Some orders auto canceled. I gave a verbal order for 2 L of LR. I scrubbed out and put the general surgery IV infusion order set in again.  Signed and pended yet again.  [name], M.D., F.A.C.S. Gastrointestinal and Minimally Invasive Surgery [facility] [address], [place], [place] [address] [phone] Main / Paging

## 2017-08-30 NOTE — Progress Notes (Signed)
PT Cancellation Note  Patient Details Name: [name] MRN: [id number] DOB: 06/28/1956   Cancelled Treatment:    Reason Eval/Treat Not Completed: Other (comment)  Pt just received liquid tray and requests PT to check back.  Naszir Cott,KATHrine E 08/30/2017, 10:26 AM Carmelia Bake, PT, DPT 08/30/2017 Pager: [phone]

## 2017-08-30 NOTE — Consult Note (Signed)
[facility] Nurse ostomy consult note Stoma type/location: RLQ ileostomy Stomal assessment/size: approximately 1 and 1/2 inches, seen through intact pouch Peristomal assessment: not seen today.  Treatment options for stomal/peristomal skin: None Output: thin, brown effluent Ostomy pouching: 1pc convex pouch applied recently (Sunday?  Monday?) Patient cannot recall.  No signs of imminent leakage. Education provided: None. Support provided. Patient is simultaneously visited by Dr. Lorrene Reid. She is tearful.  After MD visit she is upset. Concerned about placement, unsure if husband can continue to manage her. Reassurance provided. Will plan to see again in am and change pouch if needed. Supplies in room (from home). Uses Hollister pouch, Eakin paste, Hollister skin barrier rings. Belt. Enrolled patient in [place] program: Yes, previously.  [facility] nursing team will follow while in house, and will remain available to this patient, the nursing, surgical and medical teams.   Thanks, Maudie Flakes, MSN, RN, [place], Arther Abbott  Pager# [phone]

## 2017-08-30 NOTE — Progress Notes (Signed)
Patient seen in clinic. Elevated creatinine.  Restarting IV fluids.  General surgery IV infusion order set placed  [name], M.D., F.A.C.S. Gastrointestinal and Minimally Invasive Surgery [facility] [address], [place], [place] [address] [phone] Main / Paging

## 2017-08-30 NOTE — Evaluation (Signed)
Physical Therapy Evaluation Patient Details Name: [name] MRN: [id number] DOB: 1955/12/09 Today's Date: 08/30/2017   History of Present Illness  Pt is a 62 year old female with hx of bilateral mastectomies for breast cancer, chronic neutropenia,  h/o ovarian and endometrial CA, h/o colostomy 2016, w/takedown/loop ileostomy diversion as well as resection of ureteral stricture w/ bladder hitch reimplantation all in 03/2017. Pt had her stent removed 08/17/17 and admitted 08/29/17 for acute renal failure.  Clinical Impression  Pt admitted with above diagnosis. Pt currently with functional limitations due to the deficits listed below (see PT Problem List).  Pt will benefit from skilled PT to increase their independence and safety with mobility to allow discharge to the venue listed below.  Pt up in recliner on arrival and agreeable to ambulate as tolerated.  Pt reports a decline in mobility prior to admission and a mild dyspnea with activity (she states is baseline).  Pt would like to d/c home.  Pt reports borrowing a rollator prior to admission.     Follow Up Recommendations Outpatient PT(per surgeon notes, would like pt to have pelvic floor PT)    Equipment Recommendations  None recommended by PT    Recommendations for Other Services       Precautions / Restrictions Precautions Precautions: Fall      Mobility  Bed Mobility Overal bed mobility: Needs Assistance Bed Mobility: Sit to Supine       Sit to supine: Supervision      Transfers Overall transfer level: Needs assistance Equipment used: Rolling walker (2 wheeled) Transfers: Sit to/from Stand Sit to Stand: Min guard         General transfer comment: min/guard for safety  Ambulation/Gait Ambulation/Gait assistance: Min guard Ambulation Distance (Feet): 120 Feet(x2) Assistive device: Rolling walker (2 wheeled) Gait Pattern/deviations: Step-through pattern;Decreased stride length     General Gait Details: verbal  cues for RW positioning, 120 x 2 with seated rest break due to fatigue,  pt also reports 5/10 headache with mobilizing, improved with seated break  Stairs            Wheelchair Mobility    Modified Rankin (Stroke Patients Only)       Balance                                             Pertinent Vitals/Pain Pain Assessment: 0-10 Pain Score: 5  Pain Location: headache Pain Descriptors / Indicators: Aching Pain Intervention(s): Limited activity within patient's tolerance;Repositioned;Monitored during session    Home Living Family/patient expects to be discharged to:: Private residence Living Arrangements: Spouse/significant other Available Help at Discharge: Family Type of Home: House Home Access: Stairs to enter   Technical brewer of Steps: 3 Home Layout: Able to live on main level with bedroom/bathroom Home Equipment: Environmental consultant - 4 wheels      Prior Function Level of Independence: Independent with assistive device(s)               Hand Dominance        Extremity/Trunk Assessment        Lower Extremity Assessment Lower Extremity Assessment: Generalized weakness       Communication   Communication: No difficulties  Cognition Arousal/Alertness: Awake/alert Behavior During Therapy: Flat affect Overall Cognitive Status: Within Functional Limits for tasks assessed  General Comments      Exercises     Assessment/Plan    PT Assessment Patient needs continued PT services  PT Problem List Decreased strength;Decreased mobility;Decreased knowledge of use of DME       PT Treatment Interventions DME instruction;Therapeutic activities;Therapeutic exercise;Gait training;Patient/family education;Stair training;Functional mobility training    PT Goals (Current goals can be found in the Care Plan section)  Acute Rehab PT Goals PT Goal Formulation: With patient Time For Goal  Achievement: 09/06/17 Potential to Achieve Goals: Good    Frequency Min 3X/week   Barriers to discharge        Co-evaluation               AM-PAC PT "6 Clicks" Daily Activity  Outcome Measure Difficulty turning over in bed (including adjusting bedclothes, sheets and blankets)?: None Difficulty moving from lying on back to sitting on the side of the bed? : A Little Difficulty sitting down on and standing up from a chair with arms (e.g., wheelchair, bedside commode, etc,.)?: A Little Help needed moving to and from a bed to chair (including a wheelchair)?: A Little Help needed walking in hospital room?: A Little Help needed climbing 3-5 steps with a railing? : A Lot 6 Click Score: 18    End of Session   Activity Tolerance: Patient tolerated treatment well Patient left: in bed;with call bell/phone within reach;with family/visitor present   PT Visit Diagnosis: Difficulty in walking, not elsewhere classified (R26.2)    Time: 3235-5732 PT Time Calculation (min) (ACUTE ONLY): 22 min   Charges:   PT Evaluation $PT Eval Low Complexity: 1 Low     PT G CodesCarmelia Bake, PT, DPT 08/30/2017 Pager: [phone]  York Ram E 08/30/2017, 3:56 PM

## 2017-08-30 NOTE — Progress Notes (Signed)
Patient requesting her cancer drug, Arimedex. As I see no notes or oncology consults, Dr Johney Maine paged for advisement. [name], RN

## 2017-08-30 NOTE — Progress Notes (Signed)
Call from Radiology asking if Dr [name] can confirm that she wants a second CXR today as ordered. This morning's CXR was one view and Dr [name] ordered two view. Dr [name] paged. [name], RN

## 2017-08-30 NOTE — Progress Notes (Signed)
[address] [place], [place] [address] Phone: [phone]  FAX: [phone]      Guillermina Shaft [id number] 1956/05/25  CARE TEAM:  PCP: Ann Held, DO  Outpatient Care Team: Patient Care Team: Carollee Herter, Alferd Apa, DO as PCP - General (Family Medicine) Heath Lark, MD as Consulting Physician (Hematology and Oncology) Everitt Amber, MD as Consulting Physician (Obstetrics and Gynecology) Michael [name], MD as Consulting Physician (General Surgery) Alexis Frock, MD as Consulting Physician (Urology) [place], Legrand Como, MD as Consulting Physician (Endocrinology) Katy Apo, MD as Consulting Physician (Ophthalmology) Irene Limbo, MD as Consulting Physician (Plastic Surgery) Madelon Lips, MD as Consulting Physician (Nephrology)  Inpatient Treatment Team: Treatment Team: Attending Provider: Michael [name], MD; Registered Nurse: Heloise Ochoa, RN; Consulting Physician: Alexis Frock, MD; Registered Nurse: Illene Regulus, RN; Consulting Physician: Madelon Lips, MD; Registered Nurse: Vicente Serene, RN; Technician: Reuel Derby, NT   Problem List:   Principal Problem:   ARF (acute renal failure) [facility]) Active Problems:   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 s/p resection/partial vaginectomy/ LAR/colostomy 11/19/2014   Ureteral stricture, right, s/p resection & bladder hitch reimplantation 04/01/2017   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   Primary hypothyroidism   DM type 2 (diabetes mellitus, type 2) ([facility])   History of ovarian & endometrial cancer   Morbid obesity ([facility])   Pelvic cancer s/p colostomy takedown/loop ileostomy diversion 04/01/2017   Protein-calorie malnutrition, moderate (HCC)   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[facility])   Chronic kidney disease (CKD), stage III (moderate) (H[facility])   Dehydration   Adjustment disorder with mixed anxiety and depressed mood  SURGERY 04/01/2017      POST-OPERATIVE DIAGNOSIS:   Recurrent endometrial cancer status post low anterior resection and partial pelvic exeneration  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   SURGEON:Jourden Gilson Gwynneth Aliment, MD- Primary (Dr Tresa Moore assist) PROCEDURE:  XI ROBOTIC LYSIS OF ADHESIONS x 5 hours XI ROBOTIC COLOSTOMY TAKEDOWN ROBOTIC SEWN COLOANAL ANASTOMOSIS Ileocecal resection with anastomosis Diverting loop ileostomy  SURGEON: Alexis Frock, MD - Primary (Dr Johney Maine assist) PROCEDURE:  Cystoscopy with R retrograde and stent exchange.  Robotic R ureteral reimplant with psoas bladder hitch.  Consults: urology   SURGERY 08/17/2017  DIAGNOSIS:  Right ureteral stricture, status post complex reconstruction.  PROCEDURES: 1. Cystoscopy with right retrograde pyelogram. 2. Removal of right ureteral stent.  PHYSICIAN:  Alexis Frock, MD        SPECIMENS:  Right ureteral stone for discard, inspected and intact.  FINDINGS: 1. Fusiform bladder shape consistent with prior psoas hitch. 2. Visibly patent ureteral-bladder anastomosis. 3. No evidence of hydronephrosis or narrowing with right retrograde     pyelogram.    Assessment  FAIR  Plan:   -Nephrology consultation.  Discussed with Dr. Lorrene Reid.  Renal ultrasound.  Repeat labs.  Place Foley for better ins and outs.  Check chest x-ray and abdominal films to rule out pulmonary edema, ileus, PICC placement.  Hopefully it will not progress to requiring dialysis but definitely a concern for now  Medicine consult for multiple medical issues and decline.  Discussed with Dr. Leafy Ro with urology.  He will reevaluate to make sure there is no evidence of any ureteral obstruction leak or other concerns.  Dirty urinalysis most likely contamination and colonization with chronic stent and not true infection but may benefit from antibiotics.  See what he thinks.  Start Zoloft for depression.  Hold off on antibiotics for now.  May  revisit  depending on what medicine and urology feel.  -Soild diet with supp shakes  -Ostomy nurse consultation for ileostomy bag pouching.  See if they can better control this and her chronic nausea.  Some question of a decubitus on her gluteal region.  Looks like a mild scratch to me last week.  See what they think.    -Continue to monitor lytes  -Hemoglobin low but stable in pancytopenic pt  -Continue neupogen injections for pancytopenia  -Continue Rheumatex PO  -VTE prophylaxis: SCDs, etc  -Ambulate as tolerated to help recovery  Long discussion with the patient about considering second opinion versus continue with me.  She wishes to continue with me with the help of the other consultations.  She been frustrated and has been even more so Friday and Monday.  Hopefully we can turn things around.  Home health nursing seems to have failed.  Lack of outpatient ostomy clinic in town here making outpatient management challenging.   I strongly suspect she will need a SNF:     60 minutes spent in review, evaluation, examination, counseling, and coordination of care.  More than 50% of that time was spent in counseling.  [name], M.D., F.A.C.S. Gastrointestinal and Minimally Invasive Surgery [facility] [address], [place], [place] [address] [phone] Main / Paging    08/30/2017    Subjective: (Chief complaint)  Tired.  Trying liquids.  Some emesis last night.  Less so now.  Less lightheaded.  Tearful and frustrated.   Objective:  Vital signs:  Vitals:   08/30/17 0200 08/30/17 0530 08/30/17 0545 08/30/17 0552  BP: 95/61 112/62 110/64   Pulse: 85 82 84   Resp: 18 18 18 18   Temp: 99.1 F (37.3 C) 99.1 F (37.3 C) 98.1 F (36.7 C)   TempSrc: Oral Oral Oral   SpO2:      Weight:    82.9 kg (182 lb 12.2 oz)  Height:    5' 3"  (1.6 m)       Intake/Output   Yesterday:  01/28 0701 - 01/29 0700 In: 1106.7  [P.O.:120; I.V.:986.7] Out: 800 [Urine:100; Stool:700] This shift:  No intake/output data recorded.  Bowel function:  Flatus: YES   BM:  YES.  Thick oatmeal consistency succus in ileostomy bag.  Drain: No drains.     Physical Exam:  General: Pt awake/alert/oriented x4 in no acute distress Eyes: PERRL, normal EOM.  Sclera clear.  No icterus Neuro: CN II-XII intact w/o focal sensory/motor deficits. Lymph: No head/neck/groin lymphadenopathy Psych:  No delerium/psychosis/paranoia.  Tearful.  Occasionally moaning/crying.  More consolable than last week though.   HENT: Normocephalic, Mucus membranes moist.  No thrush Neck: Supple, No tracheal deviation Chest: No chest wall pain w good excursion CV:  Pulses intact.  Regular rhythm MS: Normal AROM mjr joints.  No obvious deformity  Abdomen: Soft.  Nondistended.  No tenderness to palpation.  No evidence of peritonitis.  No incarcerated hernias.  No wounds.  Ileostomy pink with gas and succus in bag.      GU.  No purulent drainage or hematoma.  Rectal deferred.  Last week had stricturing at coloanal anastomosis.  Had been finger dilated.  Held off on that today. Ext:   No deformity.  No mjr edema.  No cyanosis Skin: No petechiae / purpura  Results:   Labs: Results for orders placed or performed during the hospital encounter of 08/29/17 (from the past 48 hour(s))  Basic metabolic panel  Status: Abnormal   Collection Time: 08/30/17  6:23 AM  Result Value Ref Range   Sodium 130 (L) 135 - 145 mmol/L   Potassium 3.0 (L) 3.5 - 5.1 mmol/L   Chloride 107 101 - 111 mmol/L   CO2 10 (L) 22 - 32 mmol/L   Glucose, Bld 182 (H) 65 - 99 mg/dL   BUN 81 (H) 6 - 20 mg/dL   Creatinine, Ser 4.24 (H) 0.44 - 1.00 mg/dL   Calcium 8.8 (L) 8.9 - 10.3 mg/dL   GFR calc non Af Amer 10 (L) >60 mL/min   GFR calc Af Amer 12 (L) >60 mL/min    Comment: (NOTE) The eGFR has been calculated using the CKD EPI equation. This calculation has not been validated  in all clinical situations. eGFR's persistently <60 mL/min signify possible Chronic Kidney Disease.    Anion gap 13 5 - 15  CBC with Differential/Platelet     Status: Abnormal   Collection Time: 08/30/17  6:23 AM  Result Value Ref Range   WBC 2.8 (L) 4.0 - 10.5 K/uL   RBC 3.18 (L) 3.87 - 5.11 MIL/uL   Hemoglobin 9.4 (L) 12.0 - 15.0 g/dL   HCT 26.8 (L) 36.0 - 46.0 %   MCV 84.3 78.0 - 100.0 fL   MCH 29.6 26.0 - 34.0 pg   MCHC 35.1 30.0 - 36.0 g/dL   RDW 15.0 11.5 - 15.5 %   Platelets 222 150 - 400 K/uL   Neutrophils Relative % 64 %   Lymphocytes Relative 15 %   Monocytes Relative 19 %   Eosinophils Relative 2 %   Basophils Relative 0 %   Neutro Abs 1.8 1.7 - 7.7 K/uL   Lymphs Abs 0.4 (L) 0.7 - 4.0 K/uL   Monocytes Absolute 0.5 0.1 - 1.0 K/uL   Eosinophils Absolute 0.1 0.0 - 0.7 K/uL   Basophils Absolute 0.0 0.0 - 0.1 K/uL   WBC Morphology TOXIC GRANULATION     Comment: MILD LEFT SHIFT (1-5% METAS, OCC MYELO, OCC BANDS)  Magnesium     Status: Abnormal   Collection Time: 08/30/17  6:23 AM  Result Value Ref Range   Magnesium 1.1 (L) 1.7 - 2.4 mg/dL  TSH     Status: None   Collection Time: 08/30/17  6:23 AM  Result Value Ref Range   TSH 2.408 0.350 - 4.500 uIU/mL    Comment: Performed by a 3rd Generation assay with a functional sensitivity of <=0.01 uIU/mL.    Imaging / Studies: No results found.  Medications / Allergies: per chart  Antibiotics: Anti-infectives (From admission, onward)   None        Note: Portions of this report may have been transcribed using voice recognition software. Every effort was made to ensure accuracy; however, inadvertent computerized transcription errors may be present.   Any transcriptional errors that result from this process are unintentional.     [name], M.D., F.A.C.S. Gastrointestinal and Minimally Invasive Surgery [facility] [address], [place], [place] [address] [phone]  Main / Paging    08/30/2017

## 2017-08-30 NOTE — Consult Note (Signed)
Reason for Consult: Acute Renal Failure, Right Ureteral Stricture, Bacteruria  Referring Physician: Michael [name] MD  [name] is an 62 y.o. female.   HPI:   1 - Bladder Injury / Right Ureteral Stricture - s/p robotic RIGHT ureteral reimplant with psoas hitch + HM Flap 04/01/17 at time of colostomy take down / adhesiolysis, small bowel resection, loop iliostomy for right distal stricture / bladder injury sustained at pelvic resection for advanced endometrial cancer 2016. Stent removed 08/17/17. FU CT 08/25/17 with resolved right hydro.    2 - Metastatic Endometrial Cancer - s/p repeat resection [phone] with colon and vaginal involvement but negative nodes / margins. Had adjuvant chemo-XRT which she is now done with. Follows with Dr. Alvy Bimler.   3 - Acute on Chronic Renal Failure / Severe Dehydration - pt with high output iliostomy, difficult to maintain hydration, has been on PRN IVF. Cr 4's on admit late 08/2017 up from baseline <1.5. CT few days ago without hydro. Her BUN / Cr ratio has been 20 or more since iliostomy. Renal US today with some mild (stable) caliectasis that is stable x months and no right hydro at all.   4 - Bacteruria - pt with bacteruria on admit. No fevers / leukocytosis. Most recent clonal CX 07/2017 (when still had foley in place) serratia and enterococcus sens cipro.   PMH sig for DM2, morbid obesity, breast cancer, ovarian cancer, endometrial cancer.    Today "[name]" is seen in consultation for above.    Past Medical History:  Diagnosis Date  . Adrenal adenoma, left 02/08/2016   CT: stable benign  . Anemia in neoplastic disease   . Benign essential HTN   . 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 Cancer of corpus uteri, except isthmus [facility])  oncologist-- dr Denman [name] and dr Alvy Bimler     10-15-2004  dx endometroid endometrial and ovarian cancer s/p  chemotheapy and surgery(TAH w/ BSO) :  recurrent 11-19-2014 post pelvic surgery and radiation 01-29-2015 to 03-10-2015  . Chronic idiopathic neutropenia (HCC)    presumed related to chemotherapy March 2006--- followed by dr Alvy Bimler (treatment w/ G-CSF injections  . Chronic nausea   . Chronic pain    perineal/ anal  area from bladder pad irritates skin , right flank pain  . CKD stage G2/A3, GFR 60-89 and albumin creatinine ratio >300 mg/g    nephrologist-  dr Madelon Lips  . Diabetic retinopathy, background ([place])   . Difficult intravenous access    small veins--- hx PICC lines  . DM type 2 (diabetes mellitus, type 2) ([place])    monitored by dr Legrand Como altheimer  . Dysuria   . Environmental and seasonal allergies   . Fatty liver 02/08/2016   CT  . Generalized muscle weakness   . GERD (gastroesophageal reflux disease)   . Hiatal hernia   . History of abdominal abscess 04/16/2017   post surgery 04-01-2017  --- resolved 10/ 2018  . History of gastric polyp    2014  duodenum  . History of ileus 04/16/2017   resolved w/ no surgical intervention  . History of radiation therapy    01-29-2015 to 03-10-2015  pelvis 50.4Gy  . Hypothyroidism    monitored by dr Legrand Como altheimer  . Ileostomy in place [facility]) 04/01/2017   created at same time colostomy takedown.  [name] Kitchen  Lower urinary tract symptoms (LUTS)    urge urinary  incontinence  . Mixed dyslipidemia   . Multiple thyroid nodules    Managed by Dr. Harlow Asa  . PONV (postoperative nausea and vomiting)    "scopolamine patch works for me"  . Radiation-induced dermatitis    contact dermatitis , radiation completed, rash only on ankles now.  . Seasonal allergies   . Ureteral stricture, right UROLOGIT-  DR [facility]   CHRONIC--  TREATMENT URETERAL STENT  . Vitamin D deficiency   . Wears glasses     Past Surgical History:  Procedure Laterality Date  . APPENDECTOMY    . BREAST  RECONSTRUCTION WITH PLACEMENT OF TISSUE EXPANDER AND FLEX HD (ACELLULAR HYDRATED DERMIS) Bilateral 12/11/2015   Procedure: BILATERAL BREAST RECONSTRUCTION WITH PLACEMENT OF TISSUE EXPANDERS;  Surgeon: Irene Limbo, MD;  Location: [place];  Service: Plastics;  Laterality: Bilateral;  . COLONOSCOPY WITH PROPOFOL N/A 08/21/2013   Procedure: COLONOSCOPY WITH PROPOFOL;  Surgeon: Cleotis Nipper, MD;  [facility];  Service: Endoscopy;  Laterality: N/A;  . COLOSTOMY TAKEDOWN N/A 12/04/2014   Procedure: LAPROSCOPIC LYSIS OF ADHESIONS, SPLENIC MOBILIZATION, RELOCATION OF COLOSTOMY, DEBRIDEMENT INITIAL COLOSTOMY SITE;  Surgeon: Michael [name], MD;  [facility]: [facility];  Service: General;  Laterality: N/A;  . CYSTOGRAM N/A 06/01/2017   Procedure: CYSTOGRAM;  Surgeon: Alexis Frock, MD;  [facility];  Service: Urology;  Laterality: N/A;  . CYSTOSCOPY W/ RETROGRADES Right 11/21/2015   Procedure: CYSTOSCOPY WITH RETROGRADE PYELOGRAM;  Surgeon: Alexis Frock, MD;  [facility];  Service: Urology;  Laterality: Right;  . CYSTOSCOPY W/ URETERAL STENT PLACEMENT Right 11/21/2015   Procedure: CYSTOSCOPY WITH STENT REPLACEMENT;  Surgeon: Alexis Frock, MD;  [facility];  Service: Urology;  Laterality: Right;  . CYSTOSCOPY W/ URETERAL STENT PLACEMENT Right 03/10/2016   Procedure: CYSTOSCOPY WITH STENT REPLACEMENT;  Surgeon: Alexis Frock, MD;  Location: [facility];  Service: Urology;  Laterality: Right;  . CYSTOSCOPY W/ URETERAL STENT PLACEMENT Right 06/30/2016   Procedure: CYSTOSCOPY WITH RETROGRADE PYELOGRAM/URETERAL STENT EXCHANGE;  Surgeon: Alexis Frock, MD;  [facility];  Service: Urology;  Laterality: Right;  . CYSTOSCOPY W/ URETERAL STENT PLACEMENT N/A 06/01/2017   Procedure: CYSTOSCOPY WITH EXAM UNDER ANESTHESIA;  Surgeon: Alexis Frock, MD;  [facility];  Service: Urology;  Laterality: N/A;  . CYSTOSCOPY W/ URETERAL STENT PLACEMENT Right  08/17/2017   Procedure: CYSTOSCOPY WITH RETROGRADE PYELOGRAM/URETERAL STENT REMOVAL;  Surgeon: Alexis Frock, MD;  [facility];  Service: Urology;  Laterality: Right;  . CYSTOSCOPY WITH RETROGRADE PYELOGRAM, URETEROSCOPY AND STENT PLACEMENT Right 03/20/2015   Procedure: CYSTOSCOPY WITH RETROGRADE PYELOGRAM, URETEROSCOPY WITH BALLOON DILATION AND STENT PLACEMENT ON RIGHT;  Surgeon: Alexis Frock, MD;  Location: [facility];  Service: Urology;  Laterality: Right;  . CYSTOSCOPY WITH RETROGRADE PYELOGRAM, URETEROSCOPY AND STENT PLACEMENT Right 05/02/2015   Procedure: CYSTOSCOPY WITH RIGHT RETROGRADE PYELOGRAM,  DIAGNOSTIC URETEROSCOPY AND STENT PULL ;  Surgeon: Alexis Frock, MD;  Location: [facility];  Service: Urology;  Laterality: Right;  . CYSTOSCOPY WITH RETROGRADE PYELOGRAM, URETEROSCOPY AND STENT PLACEMENT Right 09/05/2015   Procedure: CYSTOSCOPY WITH RETROGRADE PYELOGRAM,  AND STENT PLACEMENT;  Surgeon: Alexis Frock, MD;  [facility];  Service: Urology;  Laterality: Right;  . CYSTOSCOPY WITH RETROGRADE PYELOGRAM, URETEROSCOPY AND STENT PLACEMENT Right 04/01/2017   Procedure: CYSTOSCOPY WITH RETROGRADE PYELOGRAM, URETEROSCOPY AND STENT PLACEMENT;  Surgeon: Alexis Frock, MD;  [facility];  Service: Urology;  Laterality: Right;  . CYSTOSCOPY WITH STENT PLACEMENT Right 10/27/2016   Procedure: CYSTOSCOPY WITH STENT CHANGE and right retrograde pyelogram;  Surgeon: Alexis Frock, MD;  Location: [facility];  Service: Urology;  Laterality: Right;  . EUS N/A 10/02/2014   Procedure: LOWER ENDOSCOPIC ULTRASOUND (EUS);  Surgeon: Arta Silence, MD;  Location: [facility];  Service: Endoscopy;  Laterality: N/A;  . EXCISION SOFT TISSUE MASS RIGHT FOREMAN  12-08-2006  . EYE SURGERY  as child   pytosis of eyelids repair  . INCISION AND DRAINAGE OF WOUND Bilateral 12/26/2015   Procedure: DEBRIDEMENT OF BILATERAL MASTECTOMY FLAPS;   Surgeon: Irene Limbo, MD;  Location: [place];  Service: Plastics;  Laterality: Bilateral;  . IR CV LINE INJECTION  05/31/2017  . IR FLUORO GUIDE CV LINE LEFT  05/31/2017  . IR FLUORO GUIDE CV LINE RIGHT  04/06/2017  . IR FLUORO GUIDE CV MIDLINE PICC RIGHT  05/30/2017  . IR RADIOLOGIST EVAL & MGMT  05/03/2017  . IR US GUIDE VASC ACCESS LEFT  05/31/2017  . IR US GUIDE VASC ACCESS RIGHT  04/06/2017  . IR US GUIDE VASC ACCESS RIGHT  05/30/2017  . LAPAROSCOPIC CHOLECYSTECTOMY  1990  . LIPOSUCTION WITH LIPOFILLING Bilateral 04/16/2016   Procedure: LIPOSUCTION WITH LIPOFILLING TO BILATERAL CHEST;  Surgeon: Irene Limbo, MD;  Location: [place];  Service: Plastics;  Laterality: Bilateral;  . MASTECTOMY W/ SENTINEL NODE BIOPSY Bilateral 12/11/2015   Procedure: RIGHT PROPHYLACTIC MASTECTOMY, LEFT TOTAL MASTECTOMY WITH LEFT AXILLARY SENTINEL LYMPH NODE BIOPSY;  Surgeon: Stark Klein, MD;  Location: [place];  Service: General;  Laterality: Bilateral;  . OSTOMY N/A 11/19/2014   Procedure: OSTOMY;  Surgeon: Michael [name], MD;  [facility];  Service: General;  Laterality: N/A;  . PROCTOSCOPY N/A 04/01/2017   Procedure: RIDGE PROCTOSCOPY;  Surgeon: Michael [name], MD;  [facility];  Service: General;  Laterality: N/A;  . REMOVAL OF BILATERAL TISSUE EXPANDERS WITH PLACEMENT OF BILATERAL BREAST IMPLANTS Bilateral 04/16/2016   Procedure: REMOVAL OF BILATERAL TISSUE EXPANDERS WITH PLACEMENT OF BILATERAL BREAST IMPLANTS;  Surgeon: Irene Limbo, MD;  Location: [place];  Service: Plastics;  Laterality: Bilateral;  . ROBOTIC ASSISTED LAP VAGINAL HYSTERECTOMY N/A 11/19/2014   Procedure: ROBOTIC LYSIS OF ADHESIONS, CONVERTED TO LAPAROTOMY RADICAL UPPER VAGINECTOMY,LOW ANTERIOR BOWEL RESECTION, COLOSTOMY, BILATERAL URETERAL STENT PLACEMENT AND CYSTONOMY CLOSURE;  Surgeon: Everitt Amber, MD;  [facility];  Service: Gynecology;  Laterality: N/A;  . TISSUE  EXPANDER FILLING Bilateral 12/26/2015   Procedure: EXPANSION OF BILATERAL CHEST TISSUE EXPANDERS (60 mL- Right; 75 mL- Left);  Surgeon: Irene Limbo, MD;  Location: [place];  Service: Plastics;  Laterality: Bilateral;  . TONSILLECTOMY    . TOTAL ABDOMINAL HYSTERECTOMY  March 2006   [facility]   and Bilateral Salpingoophorectomy/  staging for Ovarian cancer/  an  . XI ROBOTIC ASSISTED LOWER ANTERIOR RESECTION N/A 04/01/2017   Procedure: XI ROBOTIC VS LAPAROSCOPIC COLOSTOMY TAKEDOWN WITH LYSIS OF ADHESIONS.;  Surgeon: Michael [name], MD;  [facility];  Service: General;  Laterality: N/A;  ERAS PATHWAY    Family History  Problem Relation Age of Onset  . Cancer Mother 50       stomach ca  . Hypertension Mother   . Cancer Father 49       prostate ca  . Diabetes Father   . Heart disease Father        CABG  . Breast cancer Maternal Aunt  dx in her 8s  . Lymphoma Paternal Aunt   . Brain cancer Paternal Grandfather   . Ovarian cancer Other   . Diabetes Sister   . Hypertension Brother y-10  . Heart disease Brother        CABG  . Diabetes Brother     Social History:  reports that  has never smoked. she has never used smokeless tobacco. She reports that she drinks alcohol. She reports that she does not use drugs.  Allergies:  Allergies  Allergen Reactions  . Penicillins Swelling    Facial swelling/childhood allergy Has patient had a PCN reaction causing immediate rash, facial/tongue/throat swelling, SOB or lightheadedness with hypotension: Yes Has patient had a PCN reaction causing severe rash involving mucus membranes or skin necrosis: Yes Has patient had a PCN reaction that required hospitalization yes Has patient had a PCN reaction occurring within the last 10 years: No If all of the above answers are "NO", then may proceed with Cephalosporin use.   [name] Kitchen Ultram [Tramadol] Hives  . Adhesive [Tape]     blisters  . Cefaclor Rash    Ceclor  . Erythromycin      Gastritis, abd cramps  . Trimethoprim Rash  . Ciprofloxacin Other (See Comments)    Unknown On Dr notes   . Fluconazole Rash  . Oxycodone     " I just feel weird"  . Pectin Rash    Pectin ring for stoma  . Sulfa Antibiotics Rash    Medications: I have reviewed the patient's current medications.  Results for orders placed or performed during the hospital encounter of 08/29/17 (from the past 48 hour(s))  Glucose, capillary     Status: Abnormal   Collection Time: 08/29/17  9:39 PM  Result Value Ref Range   Glucose-Capillary 159 (H) 65 - 99 mg/dL  Basic metabolic panel     Status: Abnormal   Collection Time: 08/30/17  6:23 AM  Result Value Ref Range   Sodium 130 (L) 135 - 145 mmol/L   Potassium 3.0 (L) 3.5 - 5.1 mmol/L   Chloride 107 101 - 111 mmol/L   CO2 10 (L) 22 - 32 mmol/L   Glucose, Bld 182 (H) 65 - 99 mg/dL   BUN 81 (H) 6 - 20 mg/dL   Creatinine, Ser 4.24 (H) 0.44 - 1.00 mg/dL   Calcium 8.8 (L) 8.9 - 10.3 mg/dL   GFR calc non Af Amer 10 (L) >60 mL/min   GFR calc Af Amer 12 (L) >60 mL/min    Comment: (NOTE) The eGFR has been calculated using the CKD EPI equation. This calculation has not been validated in all clinical situations. eGFR's persistently <60 mL/min signify possible Chronic Kidney Disease.    Anion gap 13 5 - 15  CBC with Differential/Platelet     Status: Abnormal   Collection Time: 08/30/17  6:23 AM  Result Value Ref Range   WBC 2.8 (L) 4.0 - 10.5 K/uL   RBC 3.18 (L) 3.87 - 5.11 MIL/uL   Hemoglobin 9.4 (L) 12.0 - 15.0 g/dL   HCT 26.8 (L) 36.0 - 46.0 %   MCV 84.3 78.0 - 100.0 fL   MCH 29.6 26.0 - 34.0 pg   MCHC 35.1 30.0 - 36.0 g/dL   RDW 15.0 11.5 - 15.5 %   Platelets 222 150 - 400 K/uL   Neutrophils Relative % 64 %   Lymphocytes Relative 15 %   Monocytes Relative 19 %   Eosinophils Relative 2 %  Basophils Relative 0 %   Neutro Abs 1.8 1.7 - 7.7 K/uL   Lymphs Abs 0.4 (L) 0.7 - 4.0 K/uL   Monocytes Absolute 0.5 0.1 - 1.0 K/uL   Eosinophils  Absolute 0.1 0.0 - 0.7 K/uL   Basophils Absolute 0.0 0.0 - 0.1 K/uL   WBC Morphology TOXIC GRANULATION     Comment: MILD LEFT SHIFT (1-5% METAS, OCC MYELO, OCC BANDS)  Magnesium     Status: Abnormal   Collection Time: 08/30/17  6:23 AM  Result Value Ref Range   Magnesium 1.1 (L) 1.7 - 2.4 mg/dL  TSH     Status: None   Collection Time: 08/30/17  6:23 AM  Result Value Ref Range   TSH 2.408 0.350 - 4.500 uIU/mL    Comment: Performed by a 3rd Generation assay with a functional sensitivity of <=0.01 uIU/mL.  Glucose, capillary     Status: Abnormal   Collection Time: 08/30/17  7:58 AM  Result Value Ref Range   Glucose-Capillary 158 (H) 65 - 99 mg/dL    No results found.  Review of Systems  Constitutional: Positive for malaise/fatigue.  HENT: Negative.   Eyes: Negative.   Respiratory: Negative.   Cardiovascular: Negative.   Gastrointestinal: Positive for diarrhea.  Genitourinary: Positive for flank pain and hematuria.  Skin: Negative.   Neurological: Negative.   Endo/Heme/Allergies: Negative.   Psychiatric/Behavioral: The patient is nervous/anxious.    Blood pressure 110/64, pulse 84, temperature 98.1 F (36.7 C), temperature source Oral, resp. rate 18, height 5' 3"  (1.6 m), weight 82.9 kg (182 lb 12.2 oz), SpO2 95 %. Physical Exam  Assessment/Plan:  1 - Bladder Injury / Right Ureteral Stricture - imaging post-repair favorable, less right hydro than in years which suggests succesfull repair. Her mild left caliectasis is chronic and stable accounting for different imaging modalities. Do NOT favor any stents / neph tubes / etc... At this time.  2 - Metastatic Endometrial Cancer - no evidence of active disease, per med-oncology.   3 - Acute on Chronic Renal Failure / Severe Dehydration - her acute BUN and Cr rise appears to be mostly pre-renal. NO new significant changes in hydro / retention on imaging last week and today. I suspect this will improve with vigorous hydration.    Greatly appreciate nephrology comanagment.   4 - Bacteruria - UCX today, I will order Cipro per pharmacy based on most recent CX data.  Please call me directly with questions.   [name] 08/30/2017, 8:34 AM

## 2017-08-30 NOTE — Consult Note (Signed)
[facility] Medical Consultation  [name] XYV:[id number] DOB: 08-02-1956 DOA: 08/29/2017 PCP: Ann Held, DO   Requesting physician: Michael [name] Date of consultation: 08/30/17 Reason for consultation: assistance managing medical problems and ARF  Impression/Recommendations 1-ARF (acute renal failure) on CKD stage 3 ([place]): in vast part due to volume depletion and decrease perfusion with hypotension -patient's IVF exchanged and adjusted -will check UA, renal US, random sodium in urine, random Cr in urine and serum/urine osmolality  -minimize the use of nephrotoxic agents and maintain as much as possible SBP > 110 -renal service has been consulted as well and will follow any further recommendations   2- Primary hypothyroidism -TSH WNL -will continue current dose of synthroid   3-DM type 2 (diabetes mellitus, type 2) with nephropathy ([place]) -holding oral hypoglycemic regimen (especially metformin in the setting of ARF) -will use SSI -follow CBG's and if needed start long acting insulin   4-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 s/p resection/partial vaginectomy/ LAR/colostomy 11/19/2014/ Ureteral stricture, right, s/p resection & bladder hitch reimplantation 04/01/2017/ Pelvic cancer s/p colostomy takedown/loop ileostomy diversion 04/01/2017. With high output ileostomy  -with further revision and post operative care as per primary service -slowly advancing diet -needs strict intake/output   5-Protein-calorie malnutrition, moderate ([place]) -nutritional service consulted -follow rec's for feeding supplements   6-Adjustment disorder with mixed anxiety and depressed mood -continue home psych meds -no hallucinations or SI  7-shivering and chills -w/o objective fever -follow urine culture and blood culture  -continue current abx's -follow ordered CXR  8-GERD -avoiding PPI in current ARF -using famotidine daily  8-hypokalemia and  hypomagnesemia -continue repletion as needed -due to GI loses from high output ileostomy   9-HLD -continue statins  TRH will followup again tomorrow. Please contact us if we can be of assistance in the meanwhile. Thank you for this consultation.  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 Had her stent removed 08/17/17 and at that time also had robotic lysis of adhesions. Patient admitted by general surgery after struggling post surgery 2 weeks ago. Difficulty eating and drinking and with high output ileostomy. Despite hydration her creatinine has continue rising and CCS asked for assistance with medical management. Patient denies CP, SOB, HA's, hematuria, dysuria, palpitations, melena, hematochezia or any other complaints.   Of note, she reported having some lightheadedness feeling whe standing for too long and also increase fatigue.  Review of Systems:  Negative except as otherwise mentioned on HPI.  Past Medical History:  Diagnosis Date  . Adrenal adenoma, left 02/08/2016   CT: stable benign  . Anemia in neoplastic disease   . Benign essential HTN   . 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 Cancer of corpus uteri, except isthmus [facility])  oncologist-- dr Denman [name] and dr Alvy Bimler    10-15-2004  dx endometroid endometrial and ovarian cancer s/p  chemotheapy and surgery(TAH w/ BSO) :  recurrent 11-19-2014 post pelvic surgery and  radiation 01-29-2015 to 03-10-2015  .  Chronic idiopathic neutropenia (HCC)    presumed related to chemotherapy March 2006--- followed by dr Alvy Bimler (treatment w/ G-CSF injections  . Chronic nausea   . Chronic pain    perineal/ anal  area from bladder pad irritates skin , right flank pain  . CKD stage G2/A3, GFR 60-89 and albumin creatinine ratio >300 mg/g    nephrologist-  dr Madelon Lips  . Diabetic retinopathy, background ([place])   . Difficult intravenous access    small veins--- hx PICC lines  . DM type 2 (diabetes mellitus, type 2) ([place])    monitored by dr Legrand Como altheimer  . Dysuria   . Environmental and seasonal allergies   . Fatty liver 02/08/2016   CT  . Generalized muscle weakness   . GERD (gastroesophageal reflux disease)   . Hiatal hernia   . History of abdominal abscess 04/16/2017   post surgery 04-01-2017  --- resolved 10/ 2018  . History of gastric polyp    2014  duodenum  . History of ileus 04/16/2017   resolved w/ no surgical intervention  . History of radiation therapy    01-29-2015 to 03-10-2015  pelvis 50.4Gy  . Hypothyroidism    monitored by dr Legrand Como altheimer  . Ileostomy in place [facility]) 04/01/2017   created at same time colostomy takedown.  . Lower urinary tract symptoms (LUTS)    urge urinary  incontinence  . Mixed dyslipidemia   . Multiple thyroid nodules    Managed by Dr. Harlow Asa  . PONV (postoperative nausea and vomiting)    "scopolamine patch works for me"  . Radiation-induced dermatitis    contact dermatitis , radiation completed, rash only on ankles now.  . Seasonal allergies   . Ureteral stricture, right UROLOGIT-  DR [facility]   CHRONIC--  TREATMENT URETERAL STENT  . Vitamin D deficiency   . Wears glasses    Past Surgical History:  Procedure Laterality Date  . APPENDECTOMY    . BREAST RECONSTRUCTION WITH PLACEMENT OF TISSUE EXPANDER AND FLEX HD (ACELLULAR HYDRATED DERMIS) Bilateral 12/11/2015   Procedure: BILATERAL BREAST RECONSTRUCTION  WITH PLACEMENT OF TISSUE EXPANDERS;  Surgeon: Irene Limbo, MD;  Location: [place];  Service: Plastics;  Laterality: Bilateral;  . COLONOSCOPY WITH PROPOFOL N/A 08/21/2013   Procedure: COLONOSCOPY WITH PROPOFOL;  Surgeon: Cleotis Nipper, MD;  [facility];  Service: Endoscopy;  Laterality: N/A;  . COLOSTOMY TAKEDOWN N/A 12/04/2014   Procedure: LAPROSCOPIC LYSIS OF ADHESIONS, SPLENIC MOBILIZATION, RELOCATION OF COLOSTOMY, DEBRIDEMENT INITIAL COLOSTOMY SITE;  Surgeon: Michael [name], MD;  [facility];  Service: General;  Laterality: N/A;  . CYSTOGRAM N/A 06/01/2017   Procedure: CYSTOGRAM;  Surgeon: Alexis Frock, MD;  [facility];  Service: Urology;  Laterality: N/A;  . CYSTOSCOPY W/ RETROGRADES Right 11/21/2015   Procedure: CYSTOSCOPY WITH RETROGRADE PYELOGRAM;  Surgeon: Alexis Frock, MD;  [facility];  Service: Urology;  Laterality: Right;  . CYSTOSCOPY W/ URETERAL STENT PLACEMENT Right 11/21/2015   Procedure: CYSTOSCOPY WITH STENT REPLACEMENT;  Surgeon: Alexis Frock, MD;  [facility];  Service: Urology;  Laterality: Right;  . CYSTOSCOPY W/ URETERAL STENT PLACEMENT Right 03/10/2016   Procedure: CYSTOSCOPY WITH STENT REPLACEMENT;  Surgeon: Alexis Frock, MD;  Location: [facility];  Service: Urology;  Laterality: Right;  . CYSTOSCOPY W/ URETERAL STENT PLACEMENT Right 06/30/2016   Procedure: CYSTOSCOPY WITH RETROGRADE PYELOGRAM/URETERAL STENT EXCHANGE;  Surgeon: Alexis Frock, MD;  [facility];  Service: Urology;  Laterality: Right;  . CYSTOSCOPY W/  URETERAL STENT PLACEMENT N/A 06/01/2017   Procedure: CYSTOSCOPY WITH EXAM UNDER ANESTHESIA;  Surgeon: Alexis Frock, MD;  [facility];  Service: Urology;  Laterality: N/A;  . CYSTOSCOPY W/ URETERAL STENT PLACEMENT Right 08/17/2017   Procedure: CYSTOSCOPY WITH RETROGRADE PYELOGRAM/URETERAL STENT REMOVAL;  Surgeon: Alexis Frock, MD;  Location: [facility];   Service: Urology;  Laterality: Right;  . CYSTOSCOPY WITH RETROGRADE PYELOGRAM, URETEROSCOPY AND STENT PLACEMENT Right 03/20/2015   Procedure: CYSTOSCOPY WITH RETROGRADE PYELOGRAM, URETEROSCOPY WITH BALLOON DILATION AND STENT PLACEMENT ON RIGHT;  Surgeon: Alexis Frock, MD;  Location: [facility];  Service: Urology;  Laterality: Right;  . CYSTOSCOPY WITH RETROGRADE PYELOGRAM, URETEROSCOPY AND STENT PLACEMENT Right 05/02/2015   Procedure: CYSTOSCOPY WITH RIGHT RETROGRADE PYELOGRAM,  DIAGNOSTIC URETEROSCOPY AND STENT PULL ;  Surgeon: Alexis Frock, MD;  Location: [facility];  Service: Urology;  Laterality: Right;  . CYSTOSCOPY WITH RETROGRADE PYELOGRAM, URETEROSCOPY AND STENT PLACEMENT Right 09/05/2015   Procedure: CYSTOSCOPY WITH RETROGRADE PYELOGRAM,  AND STENT PLACEMENT;  Surgeon: Alexis Frock, MD;  [facility];  Service: Urology;  Laterality: Right;  . CYSTOSCOPY WITH RETROGRADE PYELOGRAM, URETEROSCOPY AND STENT PLACEMENT Right 04/01/2017   Procedure: CYSTOSCOPY WITH RETROGRADE PYELOGRAM, URETEROSCOPY AND STENT PLACEMENT;  Surgeon: Alexis Frock, MD;  [facility];  Service: Urology;  Laterality: Right;  . CYSTOSCOPY WITH STENT PLACEMENT Right 10/27/2016   Procedure: CYSTOSCOPY WITH STENT CHANGE and right retrograde pyelogram;  Surgeon: Alexis Frock, MD;  Location: [facility];  Service: Urology;  Laterality: Right;  . EUS N/A 10/02/2014   Procedure: LOWER ENDOSCOPIC ULTRASOUND (EUS);  Surgeon: Arta Silence, MD;  Location: [facility];  Service: Endoscopy;  Laterality: N/A;  . EXCISION SOFT TISSUE MASS RIGHT FOREMAN  12-08-2006  . EYE SURGERY  as child   pytosis of eyelids repair  . INCISION AND DRAINAGE OF WOUND Bilateral 12/26/2015   Procedure: DEBRIDEMENT OF BILATERAL MASTECTOMY FLAPS;  Surgeon: Irene Limbo, MD;  Location: [place];  Service: Plastics;  Laterality: Bilateral;  . IR CV LINE INJECTION  05/31/2017  .  IR FLUORO GUIDE CV LINE LEFT  05/31/2017  . IR FLUORO GUIDE CV LINE RIGHT  04/06/2017  . IR FLUORO GUIDE CV MIDLINE PICC RIGHT  05/30/2017  . IR RADIOLOGIST EVAL & MGMT  05/03/2017  . IR US GUIDE VASC ACCESS LEFT  05/31/2017  . IR US GUIDE VASC ACCESS RIGHT  04/06/2017  . IR US GUIDE VASC ACCESS RIGHT  05/30/2017  . LAPAROSCOPIC CHOLECYSTECTOMY  1990  . LIPOSUCTION WITH LIPOFILLING Bilateral 04/16/2016   Procedure: LIPOSUCTION WITH LIPOFILLING TO BILATERAL CHEST;  Surgeon: Irene Limbo, MD;  Location: [place];  Service: Plastics;  Laterality: Bilateral;  . MASTECTOMY W/ SENTINEL NODE BIOPSY Bilateral 12/11/2015   Procedure: RIGHT PROPHYLACTIC MASTECTOMY, LEFT TOTAL MASTECTOMY WITH LEFT AXILLARY SENTINEL LYMPH NODE BIOPSY;  Surgeon: Stark Klein, MD;  Location: [place];  Service: General;  Laterality: Bilateral;  . OSTOMY N/A 11/19/2014   Procedure: OSTOMY;  Surgeon: Michael [name], MD;  [facility];  Service: General;  Laterality: N/A;  . PROCTOSCOPY N/A 04/01/2017   Procedure: RIDGE PROCTOSCOPY;  Surgeon: Michael [name], MD;  [facility];  Service: General;  Laterality: N/A;  . REMOVAL OF BILATERAL TISSUE EXPANDERS WITH PLACEMENT OF BILATERAL BREAST IMPLANTS Bilateral 04/16/2016   Procedure: REMOVAL OF BILATERAL TISSUE EXPANDERS WITH PLACEMENT OF BILATERAL BREAST IMPLANTS;  Surgeon: Irene Limbo, MD;  Location: [place];  Service: Plastics;  Laterality: Bilateral;  . ROBOTIC ASSISTED LAP VAGINAL HYSTERECTOMY N/A 11/19/2014   Procedure: ROBOTIC LYSIS OF ADHESIONS, CONVERTED TO LAPAROTOMY RADICAL UPPER VAGINECTOMY,LOW ANTERIOR BOWEL RESECTION, COLOSTOMY, BILATERAL URETERAL STENT PLACEMENT AND CYSTONOMY CLOSURE;  Surgeon: Everitt Amber, MD;  [facility];  Service: Gynecology;  Laterality: N/A;  . TISSUE EXPANDER FILLING Bilateral 12/26/2015   Procedure: EXPANSION OF BILATERAL CHEST TISSUE EXPANDERS (60 mL- Right; 75 mL- Left);  Surgeon: Irene Limbo, MD;   Location: [place];  Service: Plastics;  Laterality: Bilateral;  . TONSILLECTOMY    . TOTAL ABDOMINAL HYSTERECTOMY  March 2006   [facility]   and Bilateral Salpingoophorectomy/  staging for Ovarian cancer/  an  . XI ROBOTIC ASSISTED LOWER ANTERIOR RESECTION N/A 04/01/2017   Procedure: XI ROBOTIC VS LAPAROSCOPIC COLOSTOMY TAKEDOWN WITH LYSIS OF ADHESIONS.;  Surgeon: Michael [name], MD;  [facility];  Service: General;  Laterality: N/A;  ERAS PATHWAY   Social History:  reports that  has never smoked. she has never used smokeless tobacco. She reports that she drinks alcohol. She reports that she does not use drugs.  Allergies  Allergen Reactions  . Penicillins Swelling    Facial swelling/childhood allergy Has patient had a PCN reaction causing immediate rash, facial/tongue/throat swelling, SOB or lightheadedness with hypotension: Yes Has patient had a PCN reaction causing severe rash involving mucus membranes or skin necrosis: Yes Has patient had a PCN reaction that required hospitalization yes Has patient had a PCN reaction occurring within the last 10 years: No If all of the above answers are "NO", then may proceed with Cephalosporin use.   [name] Kitchen Ultram [Tramadol] Hives  . Adhesive [Tape]     blisters  . Cefaclor Rash    Ceclor  . Erythromycin     Gastritis, abd cramps  . Trimethoprim Rash  . Ciprofloxacin Other (See Comments)    Unknown On Dr notes   . Fluconazole Rash  . Oxycodone     " I just feel weird"  . Pectin Rash    Pectin ring for stoma  . Sulfa Antibiotics Rash   Family History  Problem Relation Age of Onset  . Cancer Mother 78       stomach ca  . Hypertension Mother   . Cancer Father 67       prostate ca  . Diabetes Father   . Heart disease Father        CABG  . Breast cancer Maternal Aunt        dx in her 59s  . Lymphoma Paternal Aunt   . Brain cancer Paternal Grandfather   . Ovarian cancer Other   . Diabetes Sister   . Hypertension  Brother y-10  . Heart disease Brother        CABG  . Diabetes Brother     Prior to Admission medications   Medication Sig Start Date End Date Taking? Authorizing Provider  anastrozole (ARIMIDEX) 1 MG tablet TAKE 1 TABLET DAILY 12/08/16  Yes Gorsuch, Ni, MD  Biotin 5 MG TABS Take 5 mg by mouth every morning.    Yes [provider]  Cholecalciferol (VITAMIN D3) 10000 UNITS capsule Take 10,000 Units by mouth once a week. Sunday evening's   Yes [provider]  diphenoxylate-atropine (LOMOTIL) 2.5-0.025 MG tablet Take 1-2 tablets by mouth every 8 (eight) hours. TO PREVENT LOOSE BOWEL MOVEMENTS 05/20/17  Yes [provider]  Famotidine-Ca Carb-Mag Hydrox (PEPCID COMPLETE PO) Take 1 tablet by mouth daily as needed (INDIGESTION).  Yes [provider]  filgrastim (NEUPOGEN) 480 MCG/1.6ML injection Inject 1.6 ml under the skin every 5 days for life Patient taking differently: Inject 480 mcg into the skin See admin instructions. Inject 1.6 ml under the skin every 6 days for life 01/06/17  Yes Gorsuch, Ni, MD  levothyroxine (SYNTHROID, LEVOTHROID) 150 MCG tablet Take 150 mcg by mouth daily before breakfast.    Yes [provider]  loratadine (CLARITIN) 10 MG tablet Take 10 mg by mouth every morning.    Yes [provider]  metFORMIN (GLUCOPHAGE) 1000 MG tablet Take 1,000 mg by mouth 2 (two) times daily with a meal.    Yes [provider]  omega-3 acid ethyl esters (LOVAZA) 1 G capsule Take 1 g by mouth 2 (two) times daily.    Yes [provider]  ondansetron (ZOFRAN-ODT) 4 MG disintegrating tablet Take 4 mg by mouth every 6 (six) hours as needed for nausea or vomiting.  04/12/17  Yes [provider]  Polyethyl Glycol-Propyl Glycol (SYSTANE OP) Place 1 drop into both eyes at bedtime.    Yes [provider]  Prenatal Vit-Fe Fumarate-FA (PRENATAL VITAMIN PO) Take 1 capsule by mouth daily. Takes prenatal because there are no  dyes in it   Yes [provider]  prochlorperazine (COMPAZINE) 10 MG tablet Take 1 tablet (10 mg total) by mouth 2 (two) times daily as needed for nausea or vomiting. Patient taking differently: Take 10 mg by mouth 2 (two) times daily as needed for nausea or vomiting.  05/28/17  Yes Virgel Manifold, MD  promethazine (PHENERGAN) 25 MG tablet Take 1 tablet (25 mg total) by mouth every 6 (six) hours as needed for nausea or vomiting. 05/28/17  Yes Virgel Manifold, MD  ranitidine (ZANTAC) 150 MG tablet Take 150 mg by mouth 2 (two) times daily as needed for heartburn.    Yes [provider]  rosuvastatin (CRESTOR) 10 MG tablet Take 10 mg by mouth every evening.    Yes [provider]  silver sulfADIAZINE (SILVADENE) 1 % cream Apply 1 application topically daily. 08/10/17  Yes Shelda Pal, DO  sitaGLIPtin (JANUVIA) 100 MG tablet Take 100 mg by mouth every morning.    Yes [provider]  HYDROcodone-acetaminophen (NORCO/VICODIN) 5-325 MG tablet Take 1 tablet by mouth every 6 (six) hours as needed (Pain). Patient not taking: Reported on 08/29/2017 08/10/17   Shelda Pal, DO   Physical Exam: Blood pressure 110/64, pulse 84, temperature 98.1 F (36.7 C), temperature source Oral, resp. rate 18, height _0  (1.6 m), weight 82.9 kg (182 lb 12.2 oz), SpO2 95 %. Vitals:   08/30/17 0545 08/30/17 0552  BP: 110/64   Pulse: 84   Resp: 18 18  Temp: 98.1 F (36.7 C)   SpO2:       General: pale, anxious, with intermittent crying spells; in no major distress.  Eyes: PERRLA, EOMI, no icterus, no nystagmus   ENT: slightly dry MM, no thrush, no erythema, no exudate.  Neck: no JVD (even difficult to assess due to body habitus), no thyromegaly  Cardiovascular: rate controlled, no murmur, no rubs, no gallops  Respiratory: good air movement bilaterally, no wheezing, no crackles   Abdomen: soft, mild tenderness with deep palpation in her RLQ, positive BS,  ileostomy in place (approx half bag of liquid fecal material appreciated)  Skin: healed scab puncture from robotic surgery and positive RLQ ileostomy in plce; no signs of superimposed infection or leakage currently.  Musculoskeletal:  no joint deformity, normal ROM, no contracture   Psychiatric: patient with labile mood, no SI, no hallucinations and carrying significant frustration with how bad is her current state of health. AAOX3.  Neurologic: CN intact, no focal deficit, moving four limbs spontaneously, following commands appropriately.  Labs on Admission:  Basic Metabolic Panel: Recent Labs  Lab 08/29/17 1550 08/30/17 0623  NA 129* 130*  K 3.2* 3.0*  CL 106 107  CO2 11* 10*  GLUCOSE 180* 182*  BUN 79* 81*  CREATININE 4.28* 4.24*  CALCIUM 9.0 8.8*  MG 1.1* 1.1*  PHOS 4.6  --    Liver Function Tests: Recent Labs  Lab 08/29/17 1550  AST 16  ALT 24  ALKPHOS 173*  BILITOT 0.5  PROT 6.8  ALBUMIN 2.1*   CBC: Recent Labs  Lab 08/30/17 0623  WBC 2.8*  NEUTROABS 1.8  HGB 9.4*  HCT 26.8*  MCV 84.3  PLT 222    Radiological Exams on Admission: No results found.  EKG:  None  Time spent: 65 minutes  [facility] Pager [phone]  If 7PM-7AM, please contact night-coverage [email or web address] Password [email or web address] 08/30/2017, 8:04 AM

## 2017-08-30 NOTE — Progress Notes (Deleted)
Patient requesting her cancer medication Arimedex. As I see no notes or consult for oncology, Dr Johney Maine paged for advisement. [name], RN

## 2017-08-30 NOTE — Progress Notes (Signed)
CSW consult-SNF placement.   CSW met with patient at bedside, the patient was on the phone with friend and pt. Spouse requested CSW return at  later time. CSW   CSW will follow up at later time.   Kathrin Greathouse, Latanya Presser, MSW Clinical Social Worker  [phone] 08/30/2017  12:52 PM

## 2017-08-30 NOTE — Progress Notes (Signed)
Pharmacy Antibiotic Note  [name] is a 62 y.o. female admitted on 08/29/2017 with possible UTI, in acute renal failure.  Pharmacy has been consulted for Ciprofloxacin dosing.  Plan: Cipro 200mg  IV q24hr Clearance < 15 ml/min  Height: 5\' 3"  (160 cm) Weight: 182 lb 12.2 oz (82.9 kg) IBW/kg (Calculated) : 52.4  Temp (24hrs), Avg:98.4 F (36.9 C), Min:97.3 F (36.3 C), Max:99.1 F (37.3 C)  Recent Labs  Lab 08/29/17 1550 08/30/17 0623  WBC  --  2.8*  CREATININE 4.28* 4.24*    Estimated Creatinine Clearance: 13.8 mL/min (A) (by C-G formula based on SCr of 4.24 mg/dL (H)).    Allergies  Allergen Reactions  . Penicillins Swelling    Facial swelling/childhood allergy Has patient had a PCN reaction causing immediate rash, facial/tongue/throat swelling, SOB or lightheadedness with hypotension: Yes Has patient had a PCN reaction causing severe rash involving mucus membranes or skin necrosis: Yes Has patient had a PCN reaction that required hospitalization yes Has patient had a PCN reaction occurring within the last 10 years: No If all of the above answers are "NO", then may proceed with Cephalosporin use.   [name] Kitchen Ultram [Tramadol] Hives  . Adhesive [Tape]     blisters  . Cefaclor Rash    Ceclor  . Erythromycin     Gastritis, abd cramps  . Trimethoprim Rash  . Ciprofloxacin Other (See Comments)    Unknown On Dr notes   . Fluconazole Rash  . Oxycodone     " I just feel weird"  . Pectin Rash    Pectin ring for stoma  . Sulfa Antibiotics Rash   Antimicrobials this admission: 1/29 Cipro >>   Dose adjustments this admission:  Microbiology results: 1/29 UCx: collecting prior to admin abx   Thank you for allowing pharmacy to be a part of this patient's care.  Minda Ditto PharmD Pager [phone] 08/30/2017, 10:08 AM

## 2017-08-31 ENCOUNTER — Encounter: Payer: BLUE CROSS/BLUE SHIELD | Admitting: Physical Therapy

## 2017-08-31 ENCOUNTER — Encounter (HOSPITAL_COMMUNITY): Payer: Self-pay | Admitting: Surgery

## 2017-08-31 ENCOUNTER — Inpatient Hospital Stay (HOSPITAL_COMMUNITY): Payer: BLUE CROSS/BLUE SHIELD

## 2017-08-31 DIAGNOSIS — Z923 Personal history of irradiation: Secondary | ICD-10-CM

## 2017-08-31 DIAGNOSIS — L8990 Pressure ulcer of unspecified site, unspecified stage: Secondary | ICD-10-CM

## 2017-08-31 DIAGNOSIS — E1165 Type 2 diabetes mellitus with hyperglycemia: Secondary | ICD-10-CM

## 2017-08-31 DIAGNOSIS — N321 Vesicointestinal fistula: Secondary | ICD-10-CM

## 2017-08-31 DIAGNOSIS — E860 Dehydration: Secondary | ICD-10-CM

## 2017-08-31 LAB — RENAL FUNCTION PANEL
ANION GAP: 8 (ref 5–15)
Albumin: 1.7 g/dL — ABNORMAL LOW (ref 3.5–5.0)
BUN: 69 mg/dL — ABNORMAL HIGH (ref 6–20)
CHLORIDE: 105 mmol/L (ref 101–111)
CO2: 15 mmol/L — AB (ref 22–32)
CREATININE: 3.6 mg/dL — AB (ref 0.44–1.00)
Calcium: 8.1 mg/dL — ABNORMAL LOW (ref 8.9–10.3)
GFR calc non Af Amer: 12 mL/min — ABNORMAL LOW (ref >60)
GFR, EST AFRICAN AMERICAN: 14 mL/min — AB (ref >60)
Glucose, Bld: 221 mg/dL — ABNORMAL HIGH (ref 65–99)
POTASSIUM: 3.6 mmol/L (ref 3.5–5.1)
Phosphorus: 2 mg/dL — ABNORMAL LOW (ref 2.5–4.6)
Sodium: 128 mmol/L — ABNORMAL LOW (ref 135–145)

## 2017-08-31 LAB — GLUCOSE, CAPILLARY
GLUCOSE-CAPILLARY: 308 mg/dL — AB (ref 65–99)
Glucose-Capillary: 187 mg/dL — ABNORMAL HIGH (ref 65–99)
Glucose-Capillary: 328 mg/dL — ABNORMAL HIGH (ref 65–99)
Glucose-Capillary: 241 mg/dL — ABNORMAL HIGH (ref 65–99)

## 2017-08-31 LAB — PREPARE RBC (CROSSMATCH)

## 2017-08-31 LAB — IRON AND TIBC
Iron: 7 ug/dL — ABNORMAL LOW (ref 28–170)
SATURATION RATIOS: 4 % — AB (ref 10.4–31.8)
TIBC: 164 ug/dL — ABNORMAL LOW (ref 250–450)
UIBC: 157 ug/dL

## 2017-08-31 LAB — CBC
HCT: 22.5 % — ABNORMAL LOW (ref 36.0–46.0)
HEMOGLOBIN: 7.9 g/dL — AB (ref 12.0–15.0)
MCH: 29.4 pg (ref 26.0–34.0)
MCHC: 35.1 g/dL (ref 30.0–36.0)
MCV: 83.6 fL (ref 78.0–100.0)
Platelets: 204 10*3/uL (ref 150–400)
RBC: 2.69 MIL/uL — AB (ref 3.87–5.11)
RDW: 15 % (ref 11.5–15.5)
WBC: 1.7 10*3/uL — AB (ref 4.0–10.5)

## 2017-08-31 LAB — MAGNESIUM: MAGNESIUM: 1.3 mg/dL — AB (ref 1.7–2.4)

## 2017-08-31 MED ORDER — SODIUM CHLORIDE 0.9 % IV SOLN
Freq: Once | INTRAVENOUS | Status: AC
  Administered 2017-09-07: 16:00:00 via INTRAVENOUS

## 2017-08-31 MED ORDER — IOPAMIDOL (ISOVUE-300) INJECTION 61%
INTRAVENOUS | Status: AC
  Filled 2017-08-31: qty 75

## 2017-08-31 MED ORDER — MAGNESIUM SULFATE 4 GM/100ML IV SOLN
4.0000 g | Freq: Once | INTRAVENOUS | Status: AC
  Administered 2017-08-31: 4 g via INTRAVENOUS
  Filled 2017-08-31: qty 100

## 2017-08-31 NOTE — Progress Notes (Signed)
Patient requesting humidified O2 for comfort and "I feel like I'm not breathing right". Breath sounds clear bilaterally. O2 applied at 2LPM with aqua pack. [name], RN

## 2017-08-31 NOTE — Progress Notes (Addendum)
[name]  [birth year] [id number]  Patient Care Team: Carollee Herter, Alferd Apa, DO as PCP - General (Family Medicine) Heath Lark, MD as Consulting Physician (Hematology and Oncology) Everitt Amber, MD as Consulting Physician (Obstetrics and Gynecology) Michael [name], MD as Consulting Physician (General Surgery) Alexis Frock, MD as Consulting Physician (Urology) Altheimer, Legrand Como, MD as Consulting Physician (Endocrinology) Katy Apo, MD as Consulting Physician (Ophthalmology) Irene Limbo, MD as Consulting Physician (Plastic Surgery) Madelon Lips, MD as Consulting Physician (Nephrology)  Reach by Dr. Tresa Moore while in operating room.  Contrast CT cystogram done given his suspicions as well.  Shows inferior posterior bladder leak with 3 cm tortuous path to colon.  Consistent with delayed colovesical fistula.  Definitely not there beforehand.  Contrast fills the colon.  There is some pelvic fluid as well.  Not major.  I reviewed the films myself as well as with my colorectal partner, Dr. Nadeen Landau.  Prognosis guarded.  Agree with Dr. Tresa Moore in starting with Foley decompression.  I am highly skeptical that a radiated bladder with a delayed leak will spontaneously heal.  She had internal Foley drainage, external surgical drainage, internal stenting drainage for a few months, yet this happened.   Therefore, most likely will require surgical repair.  Standard of care usually on this situation is to resect colon & do a new distal anastomosis.  Primarily repair the bladder & patch it.  However, there is no way I can redo anastomosis in a radiated pelvis with prior surgeries with prior anterior exoneration, radiation, 7-hour lysis adhesions and prior surgery when she was in better shape.  Only options I can see is to try & primarily patch the colon with OMENTOPEXY, primarily repair the bladder with OMENTOPEXY, and hope it will heal.  I am skeptical that it will.  Most likely we will have to  convert to an end colostomy to get the colon away from it and allow the bladder to be patched.  I do agree with Dr. Tresa Moore that she is rather malnourished and deconditioned at this point so would like to try and hold off on definitive repair if we can.  I do not think a permanent loop ileostomy is going to be a good solution for her as she cannot tolerate it well.  We will see.  [name], M.D., F.A.C.S. Gastrointestinal and Minimally Invasive Surgery [facility] [address] [place], [place] [address] [phone] Main / Paging    Patient Active Problem List   Diagnosis Date Noted  . Colovesical fistula - delayed 08/31/2017    Priority: High  . Ureteral stricture, right, 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) 04/01/2017 09/10/2016    Priority: High  . 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 s/p resection/partial vaginectomy/ LAR/colostomy 11/19/2014 11/19/2014    Priority: High  . 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 04/01/2017    Priority: Medium  . 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[place]) 05/10/2012    Priority: Medium  . Poorly controlled diabetes mellitus ([place]) 08/31/2017  . Pressure injury of skin 08/31/2017  . History of external beam radiation therapy to pelvis 08/31/2017  . ARF (acute renal failure) ([place]) 08/30/2017  . Adjustment disorder with mixed anxiety and depressed mood 08/30/2017  . Dehydration 08/29/2017  . Chronic kidney disease (CKD), stage III (moderate) ([place]) 08/24/2017  . Wound of right buttock 08/10/2017  . 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[place]) 06/20/2017  .  Protein-calorie malnutrition, moderate ([place]) 04/18/2017  . Postoperative anemia 04/04/2017  . 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 Hot flashes related to aromatase inhibitor therapy 03/18/2017  . Vitamin D insufficiency  08/30/2016  . Breast cancer of upper-inner quadrant of left female breast ([place])   . Pelvic pain in female 03/19/2015  . Chronic anemia 12/30/2014  . UTI (urinary tract infection)   . Pancytopenia, acquired ([place]) 11/26/2014  . Morbid obesity ([place]) 11/20/2014  . Abdominal pain 09/09/2014  . Postmenopausal bleeding 09/05/2014  . History of ovarian & endometrial cancer 07/17/2012  . Chronic neutropenia ([place]) 12/14/2011  . Primary hypothyroidism 12/14/2011  . DM type 2 (diabetes mellitus, type 2) ([place]) 12/14/2011  . Benign essential HTN 12/14/2011    Past Medical History:  Diagnosis Date  . Adrenal adenoma, left 02/08/2016   CT: stable benign  . Anemia in neoplastic disease   . Benign essential HTN   . 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 Cancer of corpus uteri, except isthmus [facility])  oncologist-- dr Denman [name] and dr Alvy Bimler    10-15-2004  dx endometroid endometrial and ovarian cancer s/p  chemotheapy and surgery(TAH w/ BSO) :  recurrent 11-19-2014 post pelvic surgery and radiation 01-29-2015 to 03-10-2015  . Chronic idiopathic neutropenia (HCC)    presumed related to chemotherapy March 2006--- followed by dr Alvy Bimler (treatment w/ G-CSF injections  . Chronic nausea   . Chronic pain    perineal/ anal  area from bladder pad irritates skin , right flank pain  . CKD stage G2/A3, GFR 60-89 and albumin creatinine ratio >300 mg/g    nephrologist-  dr Madelon Lips  . Diabetic retinopathy, background ([place])   . Difficult intravenous access    small veins--- hx PICC lines  . DM type 2 (diabetes mellitus, type 2) ([place])    monitored by dr Legrand Como altheimer  . Dysuria   . Environmental and seasonal allergies   . Fatty liver 02/08/2016   CT  . Generalized muscle weakness   . GERD (gastroesophageal reflux  disease)   . Hiatal hernia   . History of abdominal abscess 04/16/2017   post surgery 04-01-2017  --- resolved 10/ 2018  . History of gastric polyp    2014  duodenum  . History of ileus 04/16/2017   resolved w/ no surgical intervention  . History of radiation therapy    01-29-2015 to 03-10-2015  pelvis 50.4Gy  . Hypothyroidism    monitored by dr Legrand Como altheimer  . Ileostomy in place [facility]) 04/01/2017   created at same time colostomy takedown.  . Lower urinary tract symptoms (LUTS)    urge urinary  incontinence  . Mixed dyslipidemia   . Multiple thyroid nodules    Managed by Dr. Harlow Asa  . Pelvic abscess in female 04/16/2017  . PONV (postoperative nausea and vomiting)    "scopolamine patch works for me"  . Radiation-induced dermatitis    contact dermatitis , radiation completed, rash only on ankles now.  . Seasonal allergies   . Ureteral stricture, right UROLOGIT-  DR [facility]   CHRONIC--  TREATMENT URETERAL STENT  . Vitamin D deficiency   . Wears glasses     Past Surgical History:  Procedure Laterality Date  . APPENDECTOMY    . BREAST RECONSTRUCTION WITH PLACEMENT OF TISSUE EXPANDER  AND FLEX HD (ACELLULAR HYDRATED DERMIS) Bilateral 12/11/2015   Procedure: BILATERAL BREAST RECONSTRUCTION WITH PLACEMENT OF TISSUE EXPANDERS;  Surgeon: Irene Limbo, MD;  Location: [place];  Service: Plastics;  Laterality: Bilateral;  . COLONOSCOPY WITH PROPOFOL N/A 08/21/2013   Procedure: COLONOSCOPY WITH PROPOFOL;  Surgeon: Cleotis Nipper, MD;  [facility];  Service: Endoscopy;  Laterality: N/A;  . COLOSTOMY TAKEDOWN N/A 12/04/2014   Procedure: LAPROSCOPIC LYSIS OF ADHESIONS, SPLENIC MOBILIZATION, RELOCATION OF COLOSTOMY, DEBRIDEMENT INITIAL COLOSTOMY SITE;  Surgeon: Michael [name], MD;  [facility];  Service: General;  Laterality: N/A;  . CYSTOGRAM N/A 06/01/2017   Procedure: CYSTOGRAM;  Surgeon: Alexis Frock, MD;  [facility];  Service: Urology;  Laterality: N/A;  .  CYSTOSCOPY W/ RETROGRADES Right 11/21/2015   Procedure: CYSTOSCOPY WITH RETROGRADE PYELOGRAM;  Surgeon: Alexis Frock, MD;  [facility];  Service: Urology;  Laterality: Right;  . CYSTOSCOPY W/ URETERAL STENT PLACEMENT Right 11/21/2015   Procedure: CYSTOSCOPY WITH STENT REPLACEMENT;  Surgeon: Alexis Frock, MD;  [facility];  Service: Urology;  Laterality: Right;  . CYSTOSCOPY W/ URETERAL STENT PLACEMENT Right 03/10/2016   Procedure: CYSTOSCOPY WITH STENT REPLACEMENT;  Surgeon: Alexis Frock, MD;  Location: [facility];  Service: Urology;  Laterality: Right;  . CYSTOSCOPY W/ URETERAL STENT PLACEMENT Right 06/30/2016   Procedure: CYSTOSCOPY WITH RETROGRADE PYELOGRAM/URETERAL STENT EXCHANGE;  Surgeon: Alexis Frock, MD;  [facility];  Service: Urology;  Laterality: Right;  . CYSTOSCOPY W/ URETERAL STENT PLACEMENT N/A 06/01/2017   Procedure: CYSTOSCOPY WITH EXAM UNDER ANESTHESIA;  Surgeon: Alexis Frock, MD;  [facility];  Service: Urology;  Laterality: N/A;  . CYSTOSCOPY W/ URETERAL STENT PLACEMENT Right 08/17/2017   Procedure: CYSTOSCOPY WITH RETROGRADE PYELOGRAM/URETERAL STENT REMOVAL;  Surgeon: Alexis Frock, MD;  Location: [facility];  Service: Urology;  Laterality: Right;  . CYSTOSCOPY WITH RETROGRADE PYELOGRAM, URETEROSCOPY AND STENT PLACEMENT Right 03/20/2015   Procedure: CYSTOSCOPY WITH RETROGRADE PYELOGRAM, URETEROSCOPY WITH BALLOON DILATION AND STENT PLACEMENT ON RIGHT;  Surgeon: Alexis Frock, MD;  Location: [facility];  Service: Urology;  Laterality: Right;  . CYSTOSCOPY WITH RETROGRADE PYELOGRAM, URETEROSCOPY AND STENT PLACEMENT Right 05/02/2015   Procedure: CYSTOSCOPY WITH RIGHT RETROGRADE PYELOGRAM,  DIAGNOSTIC URETEROSCOPY AND STENT PULL ;  Surgeon: Alexis Frock, MD;  Location: [facility];  Service: Urology;  Laterality: Right;  . CYSTOSCOPY WITH RETROGRADE PYELOGRAM, URETEROSCOPY  AND STENT PLACEMENT Right 09/05/2015   Procedure: CYSTOSCOPY WITH RETROGRADE PYELOGRAM,  AND STENT PLACEMENT;  Surgeon: Alexis Frock, MD;  [facility];  Service: Urology;  Laterality: Right;  . CYSTOSCOPY WITH RETROGRADE PYELOGRAM, URETEROSCOPY AND STENT PLACEMENT Right 04/01/2017   Procedure: CYSTOSCOPY WITH RETROGRADE PYELOGRAM, URETEROSCOPY AND STENT PLACEMENT;  Surgeon: Alexis Frock, MD;  [facility];  Service: Urology;  Laterality: Right;  . CYSTOSCOPY WITH STENT PLACEMENT Right 10/27/2016   Procedure: CYSTOSCOPY WITH STENT CHANGE and right retrograde pyelogram;  Surgeon: Alexis Frock, MD;  Location: [facility];  Service: Urology;  Laterality: Right;  . EUS N/A 10/02/2014   Procedure: LOWER ENDOSCOPIC ULTRASOUND (EUS);  Surgeon: Arta Silence, MD;  Location[facility];  Service: Endoscopy;  Laterality: N/A;  . EXCISION SOFT TISSUE MASS RIGHT FOREMAN  12-08-2006  . EYE SURGERY  as child   pytosis of eyelids repair  . INCISION AND DRAINAGE OF WOUND Bilateral 12/26/2015   Procedure: DEBRIDEMENT OF BILATERAL MASTECTOMY FLAPS;  Surgeon: Irene Limbo, MD;  Location: [facility];  Service: Clinical cytogeneticist;  Laterality: Bilateral;  . IR CV LINE INJECTION  05/31/2017  . IR FLUORO GUIDE CV LINE LEFT  05/31/2017  . IR FLUORO GUIDE CV LINE RIGHT  04/06/2017  . IR FLUORO GUIDE CV MIDLINE PICC RIGHT  05/30/2017  . IR RADIOLOGIST EVAL & MGMT  05/03/2017  . IR US GUIDE VASC ACCESS LEFT  05/31/2017  . IR US GUIDE VASC ACCESS RIGHT  04/06/2017  . IR US GUIDE VASC ACCESS RIGHT  05/30/2017  . LAPAROSCOPIC CHOLECYSTECTOMY  1990  . LIPOSUCTION WITH LIPOFILLING Bilateral 04/16/2016   Procedure: LIPOSUCTION WITH LIPOFILLING TO BILATERAL CHEST;  Surgeon: Irene Limbo, MD;  Location: [place];  Service: Plastics;  Laterality: Bilateral;  . MASTECTOMY W/ SENTINEL NODE BIOPSY Bilateral 12/11/2015   Procedure: RIGHT PROPHYLACTIC MASTECTOMY, LEFT TOTAL  MASTECTOMY WITH LEFT AXILLARY SENTINEL LYMPH NODE BIOPSY;  Surgeon: Stark Klein, MD;  Location: [place];  Service: General;  Laterality: Bilateral;  . OSTOMY N/A 11/19/2014   Procedure: OSTOMY;  Surgeon: Michael [name], MD;  [facility];  Service: General;  Laterality: N/A;  . PROCTOSCOPY N/A 04/01/2017   Procedure: RIDGE PROCTOSCOPY;  Surgeon: Michael [name], MD;  [facility];  Service: General;  Laterality: N/A;  . REMOVAL OF BILATERAL TISSUE EXPANDERS WITH PLACEMENT OF BILATERAL BREAST IMPLANTS Bilateral 04/16/2016   Procedure: REMOVAL OF BILATERAL TISSUE EXPANDERS WITH PLACEMENT OF BILATERAL BREAST IMPLANTS;  Surgeon: Irene Limbo, MD;  Location: [place];  Service: Plastics;  Laterality: Bilateral;  . ROBOTIC ASSISTED LAP VAGINAL HYSTERECTOMY N/A 11/19/2014   Procedure: ROBOTIC LYSIS OF ADHESIONS, CONVERTED TO LAPAROTOMY RADICAL UPPER VAGINECTOMY,LOW ANTERIOR BOWEL RESECTION, COLOSTOMY, BILATERAL URETERAL STENT PLACEMENT AND CYSTONOMY CLOSURE;  Surgeon: Everitt Amber, MD;  [facility];  Service: Gynecology;  Laterality: N/A;  . TISSUE EXPANDER FILLING Bilateral 12/26/2015   Procedure: EXPANSION OF BILATERAL CHEST TISSUE EXPANDERS (60 mL- Right; 75 mL- Left);  Surgeon: Irene Limbo, MD;  Location: [place];  Service: Plastics;  Laterality: Bilateral;  . TONSILLECTOMY    . TOTAL ABDOMINAL HYSTERECTOMY  March 2006   [facility]   and Bilateral Salpingoophorectomy/  staging for Ovarian cancer/  an  . XI ROBOTIC ASSISTED LOWER ANTERIOR RESECTION N/A 04/01/2017   Procedure: XI ROBOTIC VS LAPAROSCOPIC COLOSTOMY TAKEDOWN WITH LYSIS OF ADHESIONS.;  Surgeon: Michael [name], MD;  [facility];  Service: General;  Laterality: N/A;  ERAS PATHWAY    Social History   Socioeconomic History  . Marital status: Married    Spouse name: Not on file  . Number of children: 1  . Years of education: Not on file  . Highest education level: Not on file  Social Needs  .  Financial resource strain: Not on file  . Food insecurity - worry: Not on file  . Food insecurity - inability: Not on file  . Transportation needs - medical: Not on file  . Transportation needs - non-medical: Not on file  Occupational History  . 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 Smoking status: Never Smoker  . Smokeless tobacco: Never Used  Substance and Sexual Activity  . Alcohol use: Yes    Comment: rare social  . Drug use: No  . Sexual activity: Not on file  Other Topics Concern  . Not on file  Social History Narrative   Exercise-- has not gotten back into it since cancer came back    Family History  Problem  Relation Age of Onset  . Cancer Mother 50       stomach ca  . Hypertension Mother   . Cancer Father 65       prostate ca  . Diabetes Father   . Heart disease Father        CABG  . Breast cancer Maternal Aunt        dx in her 20s  . Lymphoma Paternal Aunt   . Brain cancer Paternal Grandfather   . Ovarian cancer Other   . Diabetes Sister   . Hypertension Brother y-10  . Heart disease Brother        CABG  . Diabetes Brother     Current Facility-Administered Medications  Medication Dose Route Frequency Provider Last Rate Last Dose  . 0.9 %  sodium chloride infusion   Intravenous Once Patrecia Pour, MD      . acetaminophen (TYLENOL) tablet 500-1,000 mg  500-1,000 mg Oral Q6H PRN Michael [name], MD      . alum & mag hydroxide-simeth (MAALOX/MYLANTA) 200-200-20 MG/5ML suspension 30 mL  30 mL Oral Q6H PRN Nyoka Cowden, Terri L, RPH      . anastrozole (ARIMIDEX) tablet 1 mg  1 mg Oral Daily Michael [name], MD   1 mg at 08/31/17 1011  . chlorproMAZINE (THORAZINE) 25 mg in sodium chloride 0.9 % 25 mL IVPB  25 mg Intravenous Q6H PRN Minda Ditto, RPH      . [START ON 09/04/2017] cholecalciferol (VITAMIN D) tablet 10,000 Units  10,000 Units Oral Weekly Michael [name], MD      . ciprofloxacin (CIPRO) IVPB 200 mg  200 mg Intravenous Q24H  Minda Ditto, RPH   Stopped at 08/31/17 1117  . diphenoxylate-atropine (LOMOTIL) 2.5-0.025 MG per tablet 1-2 tablet  1-2 tablet Oral Tor Netters, MD   1 tablet at 08/31/17 1415  . enoxaparin (LOVENOX) injection 30 mg  30 mg Subcutaneous Q24H Michael [name], MD   30 mg at 08/30/17 2127  . famotidine (PEPCID) tablet 20 mg  20 mg Oral Daily Michael [name], MD   20 mg at 08/31/17 1011  . feeding supplement (ENSURE SURGERY) liquid 237 mL  237 mL Oral BID BM Michael [name], MD   237 mL at 08/31/17 1012  . gabapentin (NEURONTIN) capsule 300 mg  300 mg Oral Ardeen Fillers, MD   300 mg at 08/30/17 2126  . guaiFENesin-dextromethorphan (ROBITUSSIN DM) 100-10 MG/5ML syrup 10 mL  10 mL Oral Q4H PRN Michael [name], MD      . hydrocortisone (ANUSOL-HC) 2.5 % rectal cream 1 application  1 application Topical QID PRN Michael [name], MD      . hydrocortisone cream 1 % 1 application  1 application Topical TID PRN Michael [name], MD      . HYDROmorphone (DILAUDID) injection 1 mg  1 mg Intravenous Q3H PRN Barton Dubois, MD      . insulin aspart (novoLOG) injection 0-15 Units  0-15 Units Subcutaneous TID Carlyn Reichert Michael [name], MD   5 Units at 08/31/17 1733  . insulin aspart (novoLOG) injection 0-5 Units  0-5 Units Subcutaneous QHS Michael [name], MD      . iopamidol (ISOVUE-300) 61 % injection           . levothyroxine (SYNTHROID, LEVOTHROID) tablet 150 mcg  150 mcg Oral QAC breakfast Michael [name], MD   150 mcg at 08/31/17 0756  . lip balm (CARMEX) ointment 1 application  1 application Topical BID Michael [name], MD  1 application at 89/37/34 1012  . loperamide (IMODIUM) capsule 2-4 mg  2-4 mg Oral Q8H PRN Michael [name], MD      . loratadine (CLARITIN) tablet 10 mg  10 mg Oral q morning - Lorenza Burton, MD   10 mg at 08/31/17 1011  . magic mouthwash  15 mL Oral QID PRN Michael [name], MD      . menthol-cetylpyridinium (CEPACOL) lozenge 3 mg  1 lozenge Oral PRN Michael [name], MD      . methocarbamol (ROBAXIN) 1,000  mg in dextrose 5 % 50 mL IVPB  1,000 mg Intravenous Q6H PRN Michael [name], MD      . metoCLOPramide (REGLAN) tablet 5 mg  5 mg Oral TID Maebelle Munroe, MD   5 mg at 08/31/17 1618  . metoprolol tartrate (LOPRESSOR) injection 5 mg  5 mg Intravenous Q6H PRN Michael [name], MD      . phenol (CHLORASEPTIC) mouth spray 1-2 spray  1-2 spray Mouth/Throat PRN Michael [name], MD      . polyvinyl alcohol (LIQUIFILM TEARS) 1.4 % ophthalmic solution 1 drop  1 drop Both Eyes Ardeen Fillers, MD   1 drop at 08/30/17 2127  . prenatal multivitamin tablet 1 tablet  1 tablet Oral Daily Michael [name], MD   1 tablet at 08/31/17 1011  . prochlorperazine (COMPAZINE) injection 10 mg  10 mg Intravenous Q4H PRN Barton Dubois, MD   10 mg at 08/31/17 1419  . promethazine (PHENERGAN) injection 25 mg  25 mg Intravenous Q6H PRN Barton Dubois, MD      . psyllium (HYDROCIL/METAMUCIL) packet 1 packet  1 packet Oral Daily Michael [name], MD   1 packet at 08/31/17 1011  . rosuvastatin (CRESTOR) tablet 10 mg  10 mg Oral QPM Michael [name], MD   10 mg at 08/31/17 1734  . simethicone (MYLICON) chewable tablet 40 mg  40 mg Oral Q6H PRN Michael [name], MD      . sodium bicarbonate 150 mEq in dextrose 5 % 1,000 mL infusion   Intravenous Continuous Michael [name], MD 100 mL/hr at 08/31/17 1012    . sodium chloride flush (NS) 0.9 % injection 10-40 mL  10-40 mL Intracatheter PRN Michael [name], MD   20 mL at 08/31/17 0435  . [START ON 09/05/2017] Tbo-Filgrastim (GRANIX) injection 480 mcg  480 mcg Subcutaneous Weekly Michael [name], MD       Facility-Administered Medications Ordered in Other Encounters  Medication Dose Route Frequency Provider Last Rate Last Dose  . lactated ringers bolus 2,000 mL  2,000 mL Intravenous Once Michael [name], MD       Followed by  . lactated ringers infusion   Intravenous Continuous Avarae Zwart, Remo Lipps, MD      . lactated ringers bolus 2,000 mL  2,000 mL Intravenous Once Michael [name], MD       Followed by  . lactated  ringers infusion   Intravenous Continuous Michael [name], MD      . lactated ringers infusion   Intravenous Continuous Michael [name], MD      . ondansetron (ZOFRAN-ODT) disintegrating tablet 4 mg  4 mg Oral Q4H PRN Michael [name], MD       Or  . ondansetron (ZOFRAN) 4 mg in sodium chloride 0.9 % 50 mL IVPB  4 mg Intravenous Q4H PRN Michael [name], MD      . ondansetron (ZOFRAN-ODT) disintegrating tablet 4 mg  4 mg Oral Q4H PRN Michael [name], MD       Or  .  ondansetron (ZOFRAN) 4 mg in sodium chloride 0.9 % 50 mL IVPB  4 mg Intravenous Q4H PRN Michael [name], MD      . ondansetron (ZOFRAN-ODT) disintegrating tablet 4 mg  4 mg Oral Q4H PRN Michael [name], MD       Or  . ondansetron (ZOFRAN) 4 mg in sodium chloride 0.9 % 50 mL IVPB  4 mg Intravenous Q4H PRN Michael [name], MD         Allergies  Allergen Reactions  . Penicillins Swelling    Facial swelling/childhood allergy Has patient had a PCN reaction causing immediate rash, facial/tongue/throat swelling, SOB or lightheadedness with hypotension: Yes Has patient had a PCN reaction causing severe rash involving mucus membranes or skin necrosis: Yes Has patient had a PCN reaction that required hospitalization yes Has patient had a PCN reaction occurring within the last 10 years: No If all of the above answers are "NO", then may proceed with Cephalosporin use.   [name] Kitchen Ultram [Tramadol] Hives  . Adhesive [Tape]     blisters  . Cefaclor Rash    Ceclor  . Erythromycin     Gastritis, abd cramps  . Trimethoprim Rash  . Ciprofloxacin Other (See Comments)    Unknown On Dr notes   . Fluconazole Rash  . Oxycodone     " I just feel weird"  . Pectin Rash    Pectin ring for stoma  . Sulfa Antibiotics Rash    BP (!) 117/46 (BP Location: Right Leg)   Pulse 91   Temp (!) 97.5 F (36.4 C) (Oral)   Resp 18   Ht _0  (1.6 m)   Wt 82.9 kg (182 lb 12.2 oz)   SpO2 100%   BMI 32.37 kg/m   Ct Abdomen Pelvis Wo Contrast  Result Date:  08/31/2017 CLINICAL DATA:  62 year old female status post bladder reconstruction in August 2018 for urinary leak. History of ovarian and uterine cancer in 2006 with recurrence in 2016 treated with chemotherapy and radiation therapy. EXAM: CT ABDOMEN AND PELVIS WITHOUT CONTRAST TECHNIQUE: Multidetector CT imaging of the abdomen and pelvis was performed following the standard protocol without IV contrast. COMPARISON:  CT the abdomen and pelvis 08/25/2017. FINDINGS: Lower chest: Bilateral breast implants incidentally noted. Otherwise, unremarkable. Hepatobiliary: Mild diffuse low attenuation throughout the hepatic parenchyma indicative of a background of mild hepatic steatosis. Calcified granuloma in the right lobe of the liver. No definite suspicious cystic or solid hepatic lesions are noted on today's noncontrast CT examination. Status post cholecystectomy. Pancreas: No pancreatic mass or peripancreatic inflammatory changes are noted on today's noncontrast CT examination. Spleen: Unremarkable. Adrenals/Urinary Tract: Moderate atrophy of the right kidney. Mild bilateral hydroureteronephrosis. Reflux of iodinated contrast material into the right renal collecting system. Nonobstructive calculi in the lower pole collecting system of left kidney, similar to prior study from 08/25/2017, measuring up to 11 mm. Urinary bladder is partially decompressed around an indwelling Foley catheter. 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 Bilateral adrenal glands are normal in appearance. Stomach/Bowel: Normal appearance of the stomach. No pathologic dilatation of small bowel or colon. Postoperative changes of partial colectomy and small bowel resections are noted, with right upper quadrant ostomy. Adjacent to the rectovesical  fistula there is a short extension of contrast which extends posteriorly into the presacral space and  extends slightly cephalad, presumably a fistulous tract (best demonstrated on axial image 71 of series 2, sagittal images 50-58 of series 5 and coronal images 57-64 of series 4. Vascular/Lymphatic: No atherosclerotic calcifications in the abdominal or pelvic vasculature. No lymphadenopathy noted in the abdomen or pelvis. Reproductive: Status post total abdominal hysterectomy and bilateral salpingo-oophorectomy. Other: Trace volume of ascites in the low anatomic pelvis. No pneumoperitoneum. Musculoskeletal: There are no aggressive appearing lytic or blastic lesions noted in the visualized portions of the skeleton. IMPRESSION: 1. Rectovesical fistula, as above. 2. 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 3. Reflux of contrast material into the right kidney via the right ureter with moderate atrophy of the right kidney. Nonobstructive calculi in the lower pole collecting system of the left kidney measuring up to 11 mm. 4. Additional incidental findings, as above. These results will be called to the ordering clinician or representative by the Radiologist Assistant, and communication documented in the PACS or zVision Dashboard. Electronically Signed   By: Vinnie Langton M.D.   On: 08/31/2017 14:34   Ct Abdomen Pelvis Wo Contrast  Result Date: 08/25/2017 CLINICAL DATA:  Recurrent ovarian carcinoma. 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 COMPARISON:  CT 06/11/2017 FINDINGS: Lower chest: Lung bases are clear. Hepatobiliary: No focal hepatic lesion. Postcholecystectomy. No biliary dilatation. Pancreas: Pancreas is normal. No ductal dilatation. No pancreatic inflammation. Spleen: Normal spleen Adrenals/urinary tract: Nodule enlargement of the LEFT adrenal gland 16 mm is unchanged RIGHT adrenal gland normal. The RIGHT kidney is smaller than the LEFT kidney. There is gas in the collecting system of the RIGHT kidney (renal hilum and calices.) Neck hydroureter on the RIGHT. Small gas in neobladder. There is mild hydronephrosis and hydroureter of the LEFT. Several calculi in lower pole of LEFT kidney. Small gas in bladder. Stomach/Bowel: Stomach proximal small bowel normal. 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 Patient status post colectomy. There is thin linear gas collection in the presacral space paralleling the a peritoneal surface measuring 3.2 0.6 cm (image 60 series 7) no inflammation associated this fluid collection. Presacral soft tissue thickening similar Vascular/Lymphatic: Abdominal aorta is normal caliber. There is no retroperitoneal or periportal lymphadenopathy. No pelvic lymphadenopathy. Reproductive: Post hysterectomy and oophorectomy Other: No peritoneal nodularity. No omental nodularity. No peritoneal studding. No mesenteric Musculoskeletal: No aggressive osseous lesion. IMPRESSION: 1. No evidence of ovarian carcinoma recurrence. 2. Gas within the collecting system on the RIGHT renal hilum an urinary bladder bladder. Interval RIGHT ureteral stent removal. 3. Mild hydronephrosis on the LEFT and mild hydroureter. No obstructing lesion identified. 4. New thin linear gas collection along the peritoneal reflection of the deep LEFT pelvis. 5. Stable post presacral thickening. Electronically Signed   By: Suzy Bouchard M.D.   On: 08/25/2017 14:28   Dg Chest 2 View  Result Date: 08/31/2017 CLINICAL DATA:  Shortness of breath.  EXAM: CHEST  2 VIEW COMPARISON:  Radiographs of August 30, 2017. FINDINGS: The heart size and mediastinal contours are within normal limits. Both  lungs are clear. No pneumothorax or pleural effusion is noted. Right-sided PICC line is unchanged in position. The visualized skeletal structures are unremarkable. IMPRESSION: No active cardiopulmonary disease. Electronically Signed   By: Marijo Conception, M.D.   On: 08/31/2017 10:06   US Renal  Result Date: 08/30/2017 CLINICAL DATA:  62 year old female with history of acute renal failure. History of ureteral stricture. EXAM: RENAL / URINARY TRACT ULTRASOUND COMPLETE COMPARISON:  No prior renal ultrasound. CT the abdomen and pelvis 08/25/2017. FINDINGS: Right Kidney: Length: 10 cm. Echogenicity within normal limits. No mass or hydronephrosis visualized. 7 mm echogenic focus in the interpolar region of the right kidney with some mild posterior acoustic shadowing, compatible with a calculus. Left Kidney: Length: 14.9 cm. Echogenicity within normal limits. Mild to moderate hydronephrosis. No mass visualized. Echogenic focus measuring 6 mm in the lower pole with some posterior acoustic shadowing, compatible with a calculus. Bladder: Under distended and poorly visualized. IMPRESSION: 1. Small nonobstructive calculi in the collecting systems of both kidneys. 2. Mild to moderate hydronephrosis. Electronically Signed   By: Vinnie Langton M.D.   On: 08/30/2017 09:42   Dg Abd Acute W/chest  Result Date: 08/30/2017 CLINICAL DATA:  Shortness of breath on exertion. EXAM: DG ABDOMEN ACUTE W/ 1V CHEST COMPARISON:  None. FINDINGS: There is no evidence of dilated bowel loops or free intraperitoneal air. Status post cholecystectomy. Surgical clips are seen in the pelvis. Heart size and mediastinal contours are within normal limits. Both lungs are clear. IMPRESSION: No evidence of bowel obstruction or ileus. No acute cardiopulmonary disease. Electronically Signed   By: Marijo Conception, M.D.   On: 08/30/2017 11:26   Ir Picc Placement Right >5 Yrs Inc Img Guide  Result Date: 08/25/2017 CLINICAL DATA:  Dehydration, needs venous access for long-term rehydration. EXAM: PICC PLACEMENT WITH ULTRASOUND AND FLUOROSCOPY FLUOROSCOPY TIME:  0.2 minutes; 34 uGym2 DAP TECHNIQUE: After written informed consent was obtained, patient was placed in the supine position on angiographic table. Patency of the right brachial vein was confirmed with ultrasound with image documentation. An appropriate skin site was determined. Skin site was marked. Region was prepped using maximum barrier technique including cap and mask, sterile gown, sterile gloves, large sterile sheet, and Chlorhexidine as cutaneous antisepsis. The region was infiltrated locally with 1% lidocaine. Under real-time ultrasound guidance, the right brachial vein was accessed with a 21 gauge micropuncture needle; the needle tip within the vein was confirmed with ultrasound image documentation. Needle exchanged over a 018 guidewire for a peel-away sheath, through which a 5-French double-lumen power injectable PICC trimmed to 41cm was advanced, positioned with its tip near the cavoatrial junction. Spot chest radiograph confirms appropriate catheter position. Catheter was flushed per protocol and secured externally. The patient tolerated procedure well. COMPLICATIONS: COMPLICATIONS none IMPRESSION: 1. Technically successful five [place] double lumen power injectable PICC placement Electronically Signed   By: Lucrezia Europe M.D.   On: 08/25/2017 12:50    Note: This dictation was prepared with Dragon/digital dictation along with [employer]. Any transcriptional errors that result from this process are unintentional.   .[name], M.D., F.A.C.S. Gastrointestinal and Minimally Invasive Surgery [facility] [address] [place], [place] [address] [phone] Main / Paging  08/31/2017 6:08 PM

## 2017-08-31 NOTE — Consult Note (Signed)
[facility] Nurse ostomy consult note Stoma type/location: RLQ ileostomy (loop) Stomal assessment/size: 1 and 1/8 inches, proximal loop is functional end. Distal loop is at skin level. Peristomal assessment: contact irritant dermatitis, but according to patient and husband, no as severe as it sometimes is.  Current pouching system has been in place since Sunday (3 days) and is not leaking at time of change. We change today as patient's spouse and I are both available ad he would like me to critique his routine.  Treatment options for stomal/peristomal skin/Pouching Procedure:  Prepare skin barrier/pouch (cut out to 1 and 1/8 inches) Remove plastic backing. Apply Eakin seal (ring) to back to pouch. Cut [place] skin barrier into thirds.  Place 1/3 at 3 o'clock, 1/3 at 9 o'clock Use medical adhesive remover spray to remove pouch.   Use medical adhesive remover wipes to clean away any residual solvent Rinse skin with NS and pat dry Dust peristomal area with stoma powder, brush away excess. Blot powder with Cavilon Skin barrier wipes.  Fan dry. Apply Eakin Paste around stoma. Flatten with dampened (gloved) finger Spray a small amount of Medical adhesive Spray to back of pouch and also to peristomal skin. Let dry for 5 minutes.  May need to catch stool with a tampon to keep skin clean during this wait time. Place pouching system on top of Eakin paste. Hold in place for 3-5 minutes.  Output: brown soft stool  Ostomy pouching: 1pc convex with rings and paste (see above).  Education provided: Patient and husband provided support. Enrolled patient in [place] program: Yes  [facility] nursing team will follow while in house, and will remain available to this patient, the nursing and medical teams.  Thanks, Maudie Flakes, MSN, RN, [place], Arther Abbott  Pager# [phone]

## 2017-08-31 NOTE — Progress Notes (Signed)
Dr Bonner Puna paged to request Type and Screen orders, as patient has no Blood Bank bracelet. [name], RN

## 2017-08-31 NOTE — Progress Notes (Addendum)
Spoke with patient at bedside, she is agreeable to SNF if Dr. Johney Maine wants her to go but she would prefer going home and resume [facility] services with [facility]. Discussion about pelvic floor PT, she has done this previously and familiar with the process. Placed referral form on shadow chart for physician signature in case that is the plan for d/c. Spoke with [facility] and they will follow for d/c needs. [phone]

## 2017-08-31 NOTE — Progress Notes (Signed)
Call from radiology with test results from today; Dr Lorrene Reid paged. [name], RN

## 2017-08-31 NOTE — Progress Notes (Signed)
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 She underwent a prolonged procedure 04/01/2017 with colostomy takedown, ileocecal resection with coloanal anastomosis and diverting loop ileostomy, 5 hours of LOA, and right ureteral stent exchange with psoas bladder hitch. Since that time she reported significant ileostomy output and was felt not to be keeping up with per oral intake. Had ureteral stent removed Jan 2019, creatinine noted to be up to 3 from baseline of 1. Had been given IVF's as outpatient, but labs showed acute renal failure with creatinine 4.28, so patient admitted by surgery on 1/28. Nephrology, urology, and hospitalists consulted.   Assessment & Plan: Principal Problem:   ARF (acute renal failure) (HCC) Active Problems:   Primary hypothyroidism   DM type 2 (diabetes mellitus, type 2) ([place])   History of ovarian & endometrial cancer   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 s/p resection/partial vaginectomy/ LAR/colostomy 11/19/2014   Morbid obesity (HCC)   Ureteral stricture, right, s/p resection & bladder hitch reimplantation 04/01/2017   Pelvic cancer s/p colostomy takedown/loop ileostomy diversion 04/01/2017   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   Protein-calorie malnutrition, moderate (HCC)   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HCC)   Chronic kidney disease (CKD), stage III (moderate) (HCC)   Dehydration   Adjustment disorder with mixed anxiety and depressed mood   Poorly controlled diabetes mellitus ([place])   Pressure injury of skin   Colovesical fistula  Acute renal failure on stage II CKD: Suspected prerenal etiology (both decreased po intake and high output ileostomy) without imaging  evidence of urinary obstruction. As Dr. Tresa Moore points out, BUN has been elevated since ileostomy.  - Nephrology consulted - On isotonic bicarb with improvement in metabolic acidosis.  - Trend BMP, UOP.   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 - Drs. Gross and Manny to discuss plans further  - Foley placed to limit diversion  Recurrent endometrial CA s/p resection, adjuvant radiation, partial vaginectomy, LAR/colostomy April 2016. No chemotherapy due to idiopathic neutropenia. History of breast CA s/p bilateral mastectomies: Stage IIA, ER/PR positive. Followed by Dr. Denman [name].  - Continue arimidex   Chronic idiopathic neutropenia:  - Check CBC w/diff - May need repeat GCSF stim.  - Followed by Dr. Alvy Bimler who I have added to the treatment team.   Iron deficiency anemia: - Hgb down to 7.9, pt tachycardic with symptoms attributable to anemia. Will order 1u PRBCs, would also benefit from iron but not in setting of active infection.   High output ileostomy:  - WOC consulted - Continue IVF support  Polymicrobial UTI: >100k Serratia marcescens, >80k GBS on UCx, susceptibilities pending. CXR without infiltrate, no cutaneous infection/PICC site/ostomy issues.  - Monitor blood cultures - Ciprofloxacin should cover both organisms. Would probably prefer cephalosporin, though she's had serious reactions to PCNs in the past and allergy documented to cefaclor. Will continue cipro empirically.   Labile affect, adjustment disorder, possible depression: No current SI/HI.  - Zoloft started at admission.  - Would benefit from counseling as outpatient  T2DM: HbA1c 5.3% in Aug 2018.  - Holding oral agents including metformin of course - Continue mod SSI and HS correction, titrate based on requirements. Suspect hyperglycemia due to infection.   Moderate protein-calorie malnutrition:  Suspect adding protein would benefit patient even with renal failure, but defer to nephrology if protein should be  restricted.  - RD consulted, supplement as pt can tolerate.   Hypokalemia, hypomagnesemia:  - Replacement per nephrology.  Hypothyroidism: TSH 2.408 - Continue stable dose synthroid.   Hyperlipidemia:  - Continue statin   DVT prophylaxis: Lovenox 30mg  Code Status: Full Family Communication: Husband at bedside Disposition Plan: Uncertain, suspect SNF will be needed  Consultants:   Hospitalists  Nephrology  Urology  Procedures:   None  Antimicrobials:  Ciprofloxacin 1/29 >>   Subjective: Feels weak, tired, struggling just to drink some fluids today. Tearful intermittently. No chest pain. Dyspnea slightly improved. Reports yellow fluid leaking at times from rectum.   Objective: Vitals:   08/30/17 1731 08/30/17 2125 08/31/17 0526 08/31/17 1400  BP: (!) 144/104 102/61 (!) 110/50 (!) 117/46  Pulse: (!) 111 (!) 113 (!) 105 91  Resp: 20 20 20 18   Temp: 98 F (36.7 C) 98.4 F (36.9 C) 98.9 F (37.2 C) (!) 97.5 F (36.4 C)  TempSrc: Oral Oral Oral Oral  SpO2: 100% 100% 98% 100%  Weight:      Height:        Intake/Output Summary (Last 24 hours) at 08/31/2017 1703 Last data filed at 08/31/2017 1400 Gross per 24 hour  Intake 4442.5 ml  Output 2300 ml  Net 2142.5 ml   Filed Weights   08/30/17 0552  Weight: 82.9 kg (182 lb 12.2 oz)    Gen: Chronically ill-appearing 62 y.o. female in no distress Pulm: Non-labored borderline tachypnea on room air. Clear to auscultation bilaterally.  CV: Regular tachycardia. No murmur, rub, or gallop. No JVD, no pedal edema. GI: Abdomen soft, mild tenderness diffusely without rebound or guarding, non-distended, with normoactive bowel sounds. No organomegaly or masses felt. Ext: Warm, no deformities Skin: Ileostomy site without tenderness, erythema, discharge; DL PICC right brachial c/d/i.  Neuro: Alert and oriented. No focal neurological deficits. Psych: Judgement and insight appear normal. Mood labile with congruent, borad affect.    Data Reviewed: I have personally reviewed following labs and imaging studies  CBC: Recent Labs  Lab 08/30/17 0623 08/31/17 0451  WBC 2.8* 1.7*  NEUTROABS 1.8  --   HGB 9.4* 7.9*  HCT 26.8* 22.5*  MCV 84.3 83.6  PLT 222 341   Basic Metabolic Panel: Recent Labs  Lab 08/29/17 1550 08/30/17 0623 08/31/17 0451  NA 129* 130* 128*  K 3.2* 3.0* 3.6  CL 106 107 105  CO2 11* 10* 15*  GLUCOSE 180* 182* 221*  BUN 79* 81* 69*  CREATININE 4.28* 4.24* 3.60*  CALCIUM 9.0 8.8* 8.1*  MG 1.1* 1.1* 1.3*  PHOS 4.6  --  2.0*   GFR: Estimated Creatinine Clearance: 16.3 mL/min (A) (by C-G formula based on SCr of 3.6 mg/dL (H)). Liver Function Tests: Recent Labs  Lab 08/29/17 1550 08/31/17 0451  AST 16  --   ALT 24  --   ALKPHOS 173*  --   BILITOT 0.5  --   PROT 6.8  --   ALBUMIN 2.1* 1.7*   No results for input(s): LIPASE, AMYLASE in the last 168 hours. No results for input(s): AMMONIA in the last 168 hours. Coagulation Profile: No results for input(s): INR, PROTIME in the last 168 hours. Cardiac Enzymes: No results for input(s): CKTOTAL, CKMB, CKMBINDEX, TROPONINI in the last 168 hours. BNP (last 3 results) No results for input(s): PROBNP in the last 8760 hours. HbA1C: No results for input(s):  HGBA1C in the last 72 hours. CBG: Recent Labs  Lab 08/30/17 1638 08/30/17 2117 08/31/17 0708 08/31/17 1153 08/31/17 1645  GLUCAP 218* 181* 187* 328* 241*   Lipid Profile: No results for input(s): CHOL, HDL, LDLCALC, TRIG, CHOLHDL, LDLDIRECT in the last 72 hours. Thyroid Function Tests: Recent Labs    08/30/17 0623  TSH 2.408   Anemia Panel: Recent Labs    08/31/17 0451  TIBC 164*  IRON 7*   Urine analysis:    Component Value Date/Time   COLORURINE YELLOW 08/30/2017 1240   APPEARANCEUR CLOUDY (A) 08/30/2017 1240   LABSPEC 1.006 08/30/2017 1240   LABSPEC 1.015 11/17/2015 1436   PHURINE 5.0 08/30/2017 1240   GLUCOSEU NEGATIVE 08/30/2017 1240   GLUCOSEU 2,000  11/17/2015 1436   HGBUR MODERATE (A) 08/30/2017 1240   BILIRUBINUR NEGATIVE 08/30/2017 1240   BILIRUBINUR neg 08/30/2016 1025   BILIRUBINUR Negative 11/17/2015 1436   KETONESUR NEGATIVE 08/30/2017 1240   PROTEINUR 100 (A) 08/30/2017 1240   UROBILINOGEN negative 08/30/2016 1025   UROBILINOGEN 0.2 11/17/2015 1436   NITRITE NEGATIVE 08/30/2017 1240   LEUKOCYTESUR LARGE (A) 08/30/2017 1240   LEUKOCYTESUR Moderate 11/17/2015 1436   Recent Results (from the past 240 hour(s))  Culture, Urine     Status: Abnormal (Preliminary result)   Collection Time: 08/30/17 10:10 AM  Result Value Ref Range Status   Specimen Description URINE, RANDOM  Final   Special Requests NONE  Final   Culture (A)  Final    >=100,000 COLONIES/mL SERRATIA MARCESCENS 80,000 COLONIES/mL GROUP B STREP(S.AGALACTIAE)ISOLATED TESTING AGAINST S. AGALACTIAE NOT ROUTINELY PERFORMED DUE TO PREDICTABILITY OF AMP/PEN/VAN SUSCEPTIBILITY. Performed at [facility], [place] [address], [place], [place] [address]    Report Status PENDING  Incomplete  Culture, blood (Routine X 2) w Reflex to ID Panel     Status: None (Preliminary result)   Collection Time: 08/30/17  3:26 PM  Result Value Ref Range Status   Specimen Description BLOOD BLOOD LEFT HAND  Final   Special Requests IN PEDIATRIC BOTTLE Blood Culture adequate volume  Final   Culture   Final    NO GROWTH < 24 HOURS Performed at [facility], [place] [address] [place], [place] [address]    Report Status PENDING  Incomplete  Culture, blood (Routine X 2) w Reflex to ID Panel     Status: None (Preliminary result)   Collection Time: 08/30/17  3:30 PM  Result Value Ref Range Status   Specimen Description BLOOD LEFT ANTECUBITAL  Final   Special Requests IN PEDIATRIC BOTTLE Blood Culture adequate volume  Final   Culture   Final    NO GROWTH < 24 HOURS Performed at [facility], [place] [address], [place], [place] [address]    Report Status PENDING  Incomplete       Radiology Studies: Ct Abdomen Pelvis Wo Contrast  Result Date: 08/31/2017 CLINICAL DATA:  62 year old female status post bladder reconstruction in August 2018 for urinary leak. History of ovarian and uterine cancer in 2006 with recurrence in 2016 treated with chemotherapy and radiation therapy. EXAM: CT ABDOMEN AND PELVIS WITHOUT CONTRAST TECHNIQUE: Multidetector CT imaging of the abdomen and pelvis was performed following the standard protocol without IV contrast. COMPARISON:  CT the abdomen and pelvis 08/25/2017. FINDINGS: Lower chest: Bilateral breast implants incidentally noted. Otherwise, unremarkable. Hepatobiliary: Mild diffuse low attenuation throughout the hepatic parenchyma indicative of a background of mild hepatic steatosis. Calcified granuloma in the right lobe of the liver.  No definite suspicious cystic or solid hepatic lesions are noted on today's noncontrast CT examination. Status post cholecystectomy. Pancreas: No pancreatic mass or peripancreatic inflammatory changes are noted on today's noncontrast CT examination. Spleen: Unremarkable. Adrenals/Urinary Tract: Moderate atrophy of the right kidney. Mild bilateral hydroureteronephrosis. Reflux of iodinated contrast material into the right renal collecting system. Nonobstructive calculi in the lower pole collecting system of left kidney, similar to prior study from 08/25/2017, measuring up to 11 mm. Urinary bladder is partially decompressed around an indwelling Foley catheter. 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 Bilateral adrenal glands are normal in appearance. Stomach/Bowel: Normal appearance of the stomach. No pathologic dilatation of small bowel or colon. Postoperative changes of partial colectomy and small bowel resections are noted,  with right upper quadrant ostomy. Adjacent to the rectovesical fistula there is a short extension of contrast which extends posteriorly into the presacral space and extends slightly cephalad, presumably a fistulous tract (best demonstrated on axial image 71 of series 2, sagittal images 50-58 of series 5 and coronal images 57-64 of series 4. Vascular/Lymphatic: No atherosclerotic calcifications in the abdominal or pelvic vasculature. No lymphadenopathy noted in the abdomen or pelvis. Reproductive: Status post total abdominal hysterectomy and bilateral salpingo-oophorectomy. Other: Trace volume of ascites in the low anatomic pelvis. No pneumoperitoneum. Musculoskeletal: There are no aggressive appearing lytic or blastic lesions noted in the visualized portions of the skeleton. IMPRESSION: 1. Rectovesical fistula, as above. 2. 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 3. Reflux of contrast material into the right kidney via the right ureter with moderate atrophy of the right kidney. Nonobstructive calculi in the lower pole collecting system of the left kidney measuring up to 11 mm. 4. Additional incidental findings, as above. These results will be called to the ordering clinician or representative by the Radiologist Assistant, and communication documented in the PACS or zVision Dashboard. Electronically Signed   By: Vinnie Langton M.D.   On: 08/31/2017 14:34   Dg Chest 2 View  Result Date: 08/31/2017 CLINICAL DATA:  Shortness of breath. EXAM: CHEST  2 VIEW COMPARISON:  Radiographs of August 30, 2017. FINDINGS: The heart size and mediastinal contours are within normal limits. Both lungs are clear. No pneumothorax or pleural effusion is noted. Right-sided PICC line is unchanged in position. The visualized skeletal structures are unremarkable. IMPRESSION: No active cardiopulmonary disease.  Electronically Signed   By: Marijo Conception, M.D.   On: 08/31/2017 10:06   US Renal  Result Date: 08/30/2017 CLINICAL DATA:  62 year old female with history of acute renal failure. History of ureteral stricture. EXAM: RENAL / URINARY TRACT ULTRASOUND COMPLETE COMPARISON:  No prior renal ultrasound. CT the abdomen and pelvis 08/25/2017. FINDINGS: Right Kidney: Length: 10 cm. Echogenicity within normal limits. No mass or hydronephrosis visualized. 7 mm echogenic focus in the interpolar region of the right kidney with some mild posterior acoustic shadowing, compatible with a calculus. Left Kidney: Length: 14.9 cm. Echogenicity within normal limits. Mild to moderate hydronephrosis. No mass visualized. Echogenic focus measuring 6 mm in the lower pole with some posterior acoustic shadowing, compatible with a calculus. Bladder: Under distended and poorly visualized. IMPRESSION: 1. Small nonobstructive calculi in the collecting systems of both kidneys. 2.  Mild to moderate hydronephrosis. Electronically Signed   By: Vinnie Langton M.D.   On: 08/30/2017 09:42   Dg Abd Acute W/chest  Result Date: 08/30/2017 CLINICAL DATA:  Shortness of breath on exertion. EXAM: DG ABDOMEN ACUTE W/ 1V CHEST COMPARISON:  None. FINDINGS: There is no evidence of dilated bowel loops or free intraperitoneal air. Status post cholecystectomy. Surgical clips are seen in the pelvis. Heart size and mediastinal contours are within normal limits. Both lungs are clear. IMPRESSION: No evidence of bowel obstruction or ileus. No acute cardiopulmonary disease. Electronically Signed   By: Marijo Conception, M.D.   On: 08/30/2017 11:26    Scheduled Meds: . anastrozole  1 mg Oral Daily  . [START ON 09/04/2017] cholecalciferol  10,000 Units Oral Weekly  . diphenoxylate-atropine  1-2 tablet Oral Q8H  . enoxaparin (LOVENOX) injection  30 mg Subcutaneous Q24H  . famotidine  20 mg Oral Daily  . feeding supplement  237 mL Oral BID BM  . gabapentin  300  mg Oral QHS  . insulin aspart  0-15 Units Subcutaneous TID WC  . insulin aspart  0-5 Units Subcutaneous QHS  . iopamidol      . levothyroxine  150 mcg Oral QAC breakfast  . lip balm  1 application Topical BID  . loratadine  10 mg Oral q morning - 10a  . metoCLOPramide  5 mg Oral TID AC  . polyvinyl alcohol  1 drop Both Eyes QHS  . prenatal multivitamin  1 tablet Oral Daily  . psyllium  1 packet Oral Daily  . rosuvastatin  10 mg Oral QPM  . [START ON 09/05/2017] Tbo-Filgrastim  480 mcg Subcutaneous Weekly   Continuous Infusions: . sodium chloride    . chlorproMAZINE (THORAZINE) IV    . ciprofloxacin Stopped (08/31/17 1117)  . methocarbamol (ROBAXIN)  IV    .  sodium bicarbonate  infusion 1000 mL 100 mL/hr at 08/31/17 1012     LOS: 2 days   Time spent: 35 minutes.  Vance Gather, MD [facility] [email or web address] Password [email or web address] 08/31/2017, 5:03 PM

## 2017-08-31 NOTE — Progress Notes (Signed)
Ct cystogram reviewed extensively. There is NEW fistula between bladder neck area and anterior rectum. There is likely significant volume of urine contacting bowel mucosa leading to systemic reabsorption of urine. This likely explains why her Cr is so high out of proportion to what would be expected from dehydration alone. Study also confirms no obstruction as there is free reflux of contrast up both kidneys.   Discussed with Jannet (in person) and her husband [name] (by phone). I drew them detailed picture and left at the bedside.   Short term goal is to divert urine away from fistula, just with foley. This should stop a substantial portion (but certainly no all) flow across the fistula.   Long term, this may spontaneously heal with prolonged bladder drainage, though I honestly feel that is low likelihood as this tissue has been radiated previosly.  Other options would be permanent diversion away from fistula (neph tubes) or heroic attempt at surgical repair. These decisions will take place over coming weeks-mos, as she is NOT a surgical candidate at this point with her poor nutritional status and functional decline.  I will meed with Talyssa and her husband tomorrow morning to discuss further and answer questions.   Dr. Johney Maine and I have also discussed and agree upon short term goals.

## 2017-08-31 NOTE — Progress Notes (Signed)
Subjective/Chief Complaint:    1 - Bladder Injury / Right Ureteral Stricture - s/p robotic RIGHT ureteral reimplant with psoas hitch + HM Flap 04/01/17 at time of colostomy take down / adhesiolysis, small bowel resection, loop iliostomy for right distal stricture / bladder injury sustained at pelvic resection for advanced endometrial cancer 2016. Stent removed 08/17/17. FU CT 08/25/17 with resolved right hydro.   2 - Metastatic Endometrial Cancer - s/p repeat resection [phone] with colon and vaginal involvement but negative nodes / margins. Had adjuvant chemo-XRT which she is now done with. Follows with Dr. Alvy Bimler.   3 - Acute on Chronic Renal Failure / Severe Dehydration - pt with high output iliostomy, difficult to maintain hydration, has been on PRN IVF. Cr 4's on admit late 08/2017 up from baseline <1.5. CT few days ago without hydro. Her BUN / Cr ratio has been 20 or more since iliostomy. Renal US1/29 with some mild (stable) caliectasis that is stable x months and no right hydro at all.   4 - Bacteruria / Malaise- pt with bacteruria on admit. No fevers / leukocytosis. Most recent clonal CX 07/2017 (when still had foley in place) serratia and enterococcus sens cipro. [name] 1/29 - pending  5 - Rectal Drainage - some drainage of yellowiosh thin fluid from peri-rectal area with standing per report. Operatve cysto / cystogram normal 08/17/17.   PMH sig for DM2, morbid obesity, breast cancer, ovarian cancer, endometrial cancer.    Today "Anastaisa" is stable. Somewhat decreased malaise, GFR slightly improved as is BUN/Cr ratio. This has unmasked some anemia.      Objective: Vital signs in last 24 hours: Temp:  [98 F (36.7 C)-98.9 F (37.2 C)] 98.9 F (37.2 C) (01/30 0526) Pulse Rate:  [105-115] 105 (01/30 0526) Resp:  [19-20] 20 (01/30 0526) BP: (102-144)/(50-104) 110/50 (01/30 0526) SpO2:  [91 %-100 %] 98 % (01/30 0526) Last BM Date: (patient has ileostomy)  Intake/Output from  previous day: 01/29 0701 - 01/30 0700 In: 3754.6 [P.O.:1245; I.V.:2309.6; IV Piggyback:200] Out: 1690 [Urine:590; LGXQJ:1941] Intake/Output this shift: No intake/output data recorded.  General appearance: alert and with visible malaise and anxiety, husband at bedside as well.  Eyes: negative Nose: Nares normal. Septum midline. Mucosa normal. No drainage or sinus tenderness. Throat: lips, mucosa, and tongue normal; teeth and gums normal Neck: supple, symmetrical, trachea midline Back: symmetric, no curvature. ROM normal. No CVA tenderness. GI: stable large truncal obesity. Iliostomy patent of stool. Multiple scars w/o hernias.  Pelvic: external genitalia normal Extremities: extremities normal, atraumatic, no cyanosis or edema Skin: Skin color, texture, turgor normal. No rashes or lesions Lymph nodes: Cervical, supraclavicular, and axillary nodes normal. Neurologic: Grossly normal  Lab Results:  Recent Labs    08/30/17 0623 08/31/17 0451  WBC 2.8* 1.7*  HGB 9.4* 7.9*  HCT 26.8* 22.5*  PLT 222 204   BMET Recent Labs    08/30/17 0623 08/31/17 0451  NA 130* 128*  K 3.0* 3.6  CL 107 105  CO2 10* 15*  GLUCOSE 182* 221*  BUN 81* 69*  CREATININE 4.24* 3.60*  CALCIUM 8.8* 8.1*   PT/INR No results for input(s): LABPROT, INR in the last 72 hours. ABG No results for input(s): PHART, HCO3 in the last 72 hours.  Invalid input(s): PCO2, PO2  Studies/Results: US Renal  Result Date: 08/30/2017 CLINICAL DATA:  62 year old female with history of acute renal failure. History of ureteral stricture. EXAM: RENAL / URINARY TRACT ULTRASOUND COMPLETE COMPARISON:  No prior renal  ultrasound. CT the abdomen and pelvis 08/25/2017. FINDINGS: Right Kidney: Length: 10 cm. Echogenicity within normal limits. No mass or hydronephrosis visualized. 7 mm echogenic focus in the interpolar region of the right kidney with some mild posterior acoustic shadowing, compatible with a calculus. Left Kidney:  Length: 14.9 cm. Echogenicity within normal limits. Mild to moderate hydronephrosis. No mass visualized. Echogenic focus measuring 6 mm in the lower pole with some posterior acoustic shadowing, compatible with a calculus. Bladder: Under distended and poorly visualized. IMPRESSION: 1. Small nonobstructive calculi in the collecting systems of both kidneys. 2. Mild to moderate hydronephrosis. Electronically Signed   By: Vinnie Langton M.D.   On: 08/30/2017 09:42   Dg Abd Acute W/chest  Result Date: 08/30/2017 CLINICAL DATA:  Shortness of breath on exertion. EXAM: DG ABDOMEN ACUTE W/ 1V CHEST COMPARISON:  None. FINDINGS: There is no evidence of dilated bowel loops or free intraperitoneal air. Status post cholecystectomy. Surgical clips are seen in the pelvis. Heart size and mediastinal contours are within normal limits. Both lungs are clear. IMPRESSION: No evidence of bowel obstruction or ileus. No acute cardiopulmonary disease. Electronically Signed   By: Marijo Conception, M.D.   On: 08/30/2017 11:26    Anti-infectives: Anti-infectives (From admission, onward)   Start     Dose/Rate Route Frequency Ordered Stop   08/30/17 1000  ciprofloxacin (CIPRO) IVPB 200 mg     200 mg 100 mL/hr over 60 Minutes Intravenous Every 24 hours 08/30/17 0855        Assessment/Plan:   1 - Bladder Injury / Right Ureteral Stricture - imaging post-repair favorable, less right hydro than in years which suggests succesfull repair. Her mild left caliectasis is chronic and stable accounting for different imaging modalities. Do NOT favor any stents / neph tubes / etc... At this time.  2 - Metastatic Endometrial Cancer - no evidence of active disease, per med-oncology.   3 - Acute on Chronic Renal Failure / Severe Dehydration - her acute BUN and Cr rise appears to be mostly pre-renal. NO new significant changes in hydro / retention on imaging last week and this admission.  Greatly appreciate nephrology comanagment.   4  - Bacteruria - continue low dose cipro pending further CX data.   5 - Rectal Drainage - Ct cystogram today to help maximally rule out persistant GU leak. I have discussed protocol with CT techs and goal is to opacify reconstructed bladder / Rt ureter (via intentional reflux).   Consider RBC's as Hgb <8 and she does have some tachycardia / weakness.   Will follow  Please call me directly with questions.    [facility], Buel Molder 08/31/2017

## 2017-08-31 NOTE — Progress Notes (Signed)
[address] [place], [place] [address] Phone: [phone]  FAX: [phone]      [name] [id number] 04/02/1956  CARE TEAM:  PCP: Ann Held, DO  Outpatient Care Team: Patient Care Team: Carollee Herter, Alferd Apa, DO as PCP - General (Family Medicine) Heath Lark, MD as Consulting Physician (Hematology and Oncology) Everitt Amber, MD as Consulting Physician (Obstetrics and Gynecology) Michael [name], MD as Consulting Physician (General Surgery) Alexis Frock, MD as Consulting Physician (Urology) Altheimer, Legrand [place], MD as Consulting Physician (Endocrinology) Katy Apo, MD as Consulting Physician (Ophthalmology) Irene Limbo, MD as Consulting Physician (Plastic Surgery) Madelon Lips, MD as Consulting Physician (Nephrology)  Inpatient Treatment Team: Treatment Team: Attending Provider: Patrecia Pour, MD; Consulting Physician: Alexis Frock, MD; Registered Nurse: Illene Regulus, RN; Registered Nurse: Vicente Serene, RN; Technician: Reuel Derby, [place]; Consulting Physician: [name], MD; Consulting Physician: Barton Dubois, MD; Occupational Therapist: [name], OT; Registered Nurse: Sunny Schlein, RN; Consulting Physician: Fatima Blank, MD; Consulting Physician: Michael [name], MD; Technician: Leda Quail, NT   Problem List:   Principal Problem:   ARF (acute renal failure) [facility]) Active Problems:   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 s/p resection/partial vaginectomy/ LAR/colostomy 11/19/2014   Ureteral stricture, right, s/p resection & bladder hitch reimplantation 04/01/2017   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   Primary hypothyroidism   DM type 2 (diabetes mellitus, type 2) ([place])   History of ovarian & endometrial cancer   Morbid obesity ([place])   Pelvic cancer s/p colostomy takedown/loop ileostomy diversion 04/01/2017   Protein-calorie malnutrition,  moderate (HCC)   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[place])   Chronic kidney disease (CKD), stage III (moderate) (HCC)   Dehydration   Adjustment disorder with mixed anxiety and depressed mood  SURGERY 04/01/2017     POST-OPERATIVE DIAGNOSIS:   Recurrent endometrial cancer status post low anterior resection and partial pelvic exeneration  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   SURGEON:Daruis Swaim Gwynneth Aliment, MD- Primary (Dr Tresa Moore assist) PROCEDURE:  XI ROBOTIC LYSIS OF ADHESIONS x 5 hours XI ROBOTIC COLOSTOMY TAKEDOWN ROBOTIC SEWN COLOANAL ANASTOMOSIS Ileocecal resection with anastomosis Diverting loop ileostomy  SURGEON: Alexis Frock, MD - Primary (Dr Johney Maine assist) PROCEDURE:  Cystoscopy with R retrograde and stent exchange.  Robotic R ureteral reimplant with psoas bladder hitch.  Consults: urology   SURGERY 08/17/2017  DIAGNOSIS:  Right ureteral stricture, status post complex reconstruction.  PROCEDURES: 1. Cystoscopy with right retrograde pyelogram. 2. Removal of right ureteral stent.  PHYSICIAN:  Alexis Frock, MD        SPECIMENS:  Right ureteral stone for discard, inspected and intact.  FINDINGS: 1. Fusiform bladder shape consistent with prior psoas hitch. 2. Visibly patent ureteral-bladder anastomosis. 3. No evidence of hydronephrosis or narrowing with right retrograde     pyelogram.    Assessment  FAIR  Plan:   -Nephrology consultation.  Discussed with Dr. Lorrene Reid at bedside.  Renal ultrasound some right-sided ureteral dilatation but according to Dr. Rogers Seeds with urology expected with reimplantation and stenting.  Medicine consult for multiple medical issues and decline.  Creatinine improving with rehydration.  Hopefully can avoid dialysis.  Urine output picking up.  Persistent thin rectal drainage.  She wonders if it is urine.  Does not seem classic for ascites.  Concerning.  Will discuss with Dr. Tresa Moore.  Perhaps patient would  benefit from contrast cystogram to rule out leak or other abnormality.   He will reevaluate  to make sure there is no evidence of any ureteral obstruction, leak, or other concerns.  Dirty urinalysis most likely contamination and colonization with chronic stent.  Given her decline, agree with medicine to start antibiotics.   Start Zoloft for depression.  Solid diet with supp shakes  -Ostomy nurse consultation for ileostomy bag pouching.  See if they can better control this and her chronic nausea. Some question of a decubitus on her gluteal region.  Looks like a mild scratch to me last week.  See what they think.    -Continue to monitor lytes  -Hemoglobin low but stable in pancytopenic pt  -Continue neupogen injections for pancytopenia  -Continue Rheumatex PO  -VTE prophylaxis: SCDs, etc  -Ambulate as tolerated to help recovery  Patient still hoping that she could be my patient.  I noted I keep fighting with her despite her complicated medical and surgical history giving her more problems.  Hopefully we can get her to a better situation.    Home health nursing seems to have failed.  Lack of outpatient ostomy clinic in town here making outpatient management challenging.   I strongly suspect she will need a SNF:     40 minutes spent in review, evaluation, examination, counseling, and coordination of care.  More than 50% of that time was spent in counseling.  [name], M.D., F.A.C.S. Gastrointestinal and Minimally Invasive Surgery [facility] [address] [place], [place] [address] [phone] Main / Paging    08/31/2017    Subjective: (Chief complaint)  Tired. Trying liquids.  Less nauseated overall Foley catheter placed.  Making urine. Still complaining of rectal leakage when she walks around.  Having to wear diapers.  Embarrassing and frustrating    Objective:  Vital signs:  Vitals:   08/30/17 1411 08/30/17 1731  08/30/17 2125 08/31/17 0526  BP: 124/75 (!) 144/104 102/61 (!) 110/50  Pulse: (!) 115 (!) 111 (!) 113 (!) 105  Resp: 19 20 20 20   Temp: 98.2 F (36.8 C) 98 F (36.7 C) 98.4 F (36.9 C) 98.9 F (37.2 C)  TempSrc: Axillary Oral Oral Oral  SpO2: 91% 100% 100% 98%  Weight:      Height:        Last BM Date: (patient has ileostomy)  Intake/Output   Yesterday:  01/29 0701 - 01/30 0700 In: 3754.6 [P.O.:1245; I.V.:2309.6; IV Piggyback:200] Out: 1690 [Urine:590; Stool:1100] This shift:  No intake/output data recorded.  Bowel function:  Flatus: YES   BM:  YES.  Thick oatmeal consistency succus in ileostomy bag.  Drain: No drains.     Physical Exam:  General: Pt awake/alert/oriented x4 in no acute distress Eyes: PERRL, normal EOM.  Sclera clear.  No icterus Neuro: CN II-XII intact w/o focal sensory/motor deficits. Lymph: No head/neck/groin lymphadenopathy Psych:  No delerium/psychosis/paranoia.  Tearful.  Occasionally moaning/crying.  More consolable than last week though.   HENT: Normocephalic, Mucus membranes moist.  No thrush Neck: Supple, No tracheal deviation Chest: No chest wall pain w good excursion CV:  Pulses intact.  Regular rhythm MS: Normal AROM mjr joints.  No obvious deformity  Abdomen: Soft.  Nondistended.  No tenderness to palpation.  No evidence of peritonitis.  No incarcerated hernias.  No wounds.  Ileostomy pink with gas and succus in bag.      GU.  No purulent drainage or hematoma.  Foley in place with clear light yellow urine  Rectal deferred.  Last week had stricturing at coloanal anastomosis.  Had been finger dilated.  Held off on that today. Ext:   No deformity.  No mjr edema.  No cyanosis Skin: No petechiae / purpura  Results:   Labs: Results for orders placed or performed during the hospital encounter of 08/29/17 (from the past 48 hour(s))  Glucose, capillary     Status: Abnormal   Collection Time: 08/29/17  9:39 PM  Result Value Ref Range    Glucose-Capillary 159 (H) 65 - 99 mg/dL  Basic metabolic panel     Status: Abnormal   Collection Time: 08/30/17  6:23 AM  Result Value Ref Range   Sodium 130 (L) 135 - 145 mmol/L   Potassium 3.0 (L) 3.5 - 5.1 mmol/L   Chloride 107 101 - 111 mmol/L   CO2 10 (L) 22 - 32 mmol/L   Glucose, Bld 182 (H) 65 - 99 mg/dL   BUN 81 (H) 6 - 20 mg/dL   Creatinine, Ser 4.24 (H) 0.44 - 1.00 mg/dL   Calcium 8.8 (L) 8.9 - 10.3 mg/dL   GFR calc non Af Amer 10 (L) >60 mL/min   GFR calc Af Amer 12 (L) >60 mL/min    Comment: (NOTE) The eGFR has been calculated using the CKD EPI equation. This calculation has not been validated in all clinical situations. eGFR's persistently <60 mL/min signify possible Chronic Kidney Disease.    Anion gap 13 5 - 15  CBC with Differential/Platelet     Status: Abnormal   Collection Time: 08/30/17  6:23 AM  Result Value Ref Range   WBC 2.8 (L) 4.0 - 10.5 K/uL   RBC 3.18 (L) 3.87 - 5.11 MIL/uL   Hemoglobin 9.4 (L) 12.0 - 15.0 g/dL   HCT 26.8 (L) 36.0 - 46.0 %   MCV 84.3 78.0 - 100.0 fL   MCH 29.6 26.0 - 34.0 pg   MCHC 35.1 30.0 - 36.0 g/dL   RDW 15.0 11.5 - 15.5 %   Platelets 222 150 - 400 K/uL   Neutrophils Relative % 64 %   Lymphocytes Relative 15 %   Monocytes Relative 19 %   Eosinophils Relative 2 %   Basophils Relative 0 %   Neutro Abs 1.8 1.7 - 7.7 K/uL   Lymphs Abs 0.4 (L) 0.7 - 4.0 K/uL   Monocytes Absolute 0.5 0.1 - 1.0 K/uL   Eosinophils Absolute 0.1 0.0 - 0.7 K/uL   Basophils Absolute 0.0 0.0 - 0.1 K/uL   WBC Morphology TOXIC GRANULATION     Comment: MILD LEFT SHIFT (1-5% METAS, OCC MYELO, OCC BANDS)  Magnesium     Status: Abnormal   Collection Time: 08/30/17  6:23 AM  Result Value Ref Range   Magnesium 1.1 (L) 1.7 - 2.4 mg/dL  TSH     Status: None   Collection Time: 08/30/17  6:23 AM  Result Value Ref Range   TSH 2.408 0.350 - 4.500 uIU/mL    Comment: Performed by a 3rd Generation assay with a functional sensitivity of <=0.01 uIU/mL.  Glucose,  capillary     Status: Abnormal   Collection Time: 08/30/17  7:58 AM  Result Value Ref Range   Glucose-Capillary 158 (H) 65 - 99 mg/dL  Osmolality     Status: Abnormal   Collection Time: 08/30/17  9:00 AM  Result Value Ref Range   Osmolality 299 (H) 275 - 295 mOsm/kg    Comment: Performed at [facility], [place] [address], [place], [place] [address]  Osmolality, urine     Status:  Abnormal   Collection Time: 08/30/17 10:10 AM  Result Value Ref Range   Osmolality, Ur 253 (L) 300 - 900 mOsm/kg    Comment: Performed at [facility] [address] [place], [place] [address]  Glucose, capillary     Status: Abnormal   Collection Time: 08/30/17 12:28 PM  Result Value Ref Range   Glucose-Capillary 241 (H) 65 - 99 mg/dL  Sodium, urine, random     Status: None   Collection Time: 08/30/17 12:40 PM  Result Value Ref Range   Sodium, Ur 34 mmol/L    Comment: Performed at [facility] [address] [place], [place] [address]  Urinalysis, Routine w reflex microscopic     Status: Abnormal   Collection Time: 08/30/17 12:40 PM  Result Value Ref Range   Color, Urine YELLOW YELLOW   APPearance CLOUDY (A) CLEAR   Specific Gravity, Urine 1.006 1.005 - 1.030   pH 5.0 5.0 - 8.0   Glucose, UA NEGATIVE NEGATIVE mg/dL   Hgb urine dipstick MODERATE (A) NEGATIVE   Bilirubin Urine NEGATIVE NEGATIVE   Ketones, ur NEGATIVE NEGATIVE mg/dL   Protein, ur 100 (A) NEGATIVE mg/dL   Nitrite NEGATIVE NEGATIVE   Leukocytes, UA LARGE (A) NEGATIVE   RBC / HPF 0-5 0 - 5 RBC/hpf   WBC, UA TOO NUMEROUS TO COUNT 0 - 5 WBC/hpf   Bacteria, UA MANY (A) NONE SEEN   Squamous Epithelial / LPF 0-5 (A) NONE SEEN   WBC Clumps PRESENT    Mucus PRESENT   Creatinine, urine, random     Status: None   Collection Time: 08/30/17 12:40 PM  Result Value Ref Range   Creatinine, Urine 43.98 mg/dL    Comment: Performed at [facility], [place] [address], [place], [place] [address]  Glucose, capillary     Status:  Abnormal   Collection Time: 08/30/17  4:38 PM  Result Value Ref Range   Glucose-Capillary 218 (H) 65 - 99 mg/dL  Glucose, capillary     Status: Abnormal   Collection Time: 08/30/17  9:17 PM  Result Value Ref Range   Glucose-Capillary 181 (H) 65 - 99 mg/dL  Magnesium     Status: Abnormal   Collection Time: 08/31/17  4:51 AM  Result Value Ref Range   Magnesium 1.3 (L) 1.7 - 2.4 mg/dL  Renal function panel     Status: Abnormal   Collection Time: 08/31/17  4:51 AM  Result Value Ref Range   Sodium 128 (L) 135 - 145 mmol/L   Potassium 3.6 3.5 - 5.1 mmol/L   Chloride 105 101 - 111 mmol/L   CO2 15 (L) 22 - 32 mmol/L   Glucose, Bld 221 (H) 65 - 99 mg/dL   BUN 69 (H) 6 - 20 mg/dL   Creatinine, Ser 3.60 (H) 0.44 - 1.00 mg/dL   Calcium 8.1 (L) 8.9 - 10.3 mg/dL   Phosphorus 2.0 (L) 2.5 - 4.6 mg/dL   Albumin 1.7 (L) 3.5 - 5.0 g/dL   GFR calc non Af Amer 12 (L) >60 mL/min   GFR calc Af Amer 14 (L) >60 mL/min    Comment: (NOTE) The eGFR has been calculated using the CKD EPI equation. This calculation has not been validated in all clinical situations. eGFR's persistently <60 mL/min signify possible Chronic Kidney Disease.    Anion gap 8 5 - 15  CBC     Status: Abnormal   Collection Time: 08/31/17  4:51 AM  Result Value  Ref Range   WBC 1.7 (L) 4.0 - 10.5 K/uL   RBC 2.69 (L) 3.87 - 5.11 MIL/uL   Hemoglobin 7.9 (L) 12.0 - 15.0 g/dL   HCT 22.5 (L) 36.0 - 46.0 %   MCV 83.6 78.0 - 100.0 fL   MCH 29.4 26.0 - 34.0 pg   MCHC 35.1 30.0 - 36.0 g/dL   RDW 15.0 11.5 - 15.5 %   Platelets 204 150 - 400 K/uL    Imaging / Studies: US Renal  Result Date: 08/30/2017 CLINICAL DATA:  62 year old female with history of acute renal failure. History of ureteral stricture. EXAM: RENAL / URINARY TRACT ULTRASOUND COMPLETE COMPARISON:  No prior renal ultrasound. CT the abdomen and pelvis 08/25/2017. FINDINGS: Right Kidney: Length: 10 cm. Echogenicity within normal limits. No mass or hydronephrosis visualized.  7 mm echogenic focus in the interpolar region of the right kidney with some mild posterior acoustic shadowing, compatible with a calculus. Left Kidney: Length: 14.9 cm. Echogenicity within normal limits. Mild to moderate hydronephrosis. No mass visualized. Echogenic focus measuring 6 mm in the lower pole with some posterior acoustic shadowing, compatible with a calculus. Bladder: Under distended and poorly visualized. IMPRESSION: 1. Small nonobstructive calculi in the collecting systems of both kidneys. 2. Mild to moderate hydronephrosis. Electronically Signed   By: Vinnie Langton M.D.   On: 08/30/2017 09:42   Dg Abd Acute W/chest  Result Date: 08/30/2017 CLINICAL DATA:  Shortness of breath on exertion. EXAM: DG ABDOMEN ACUTE W/ 1V CHEST COMPARISON:  None. FINDINGS: There is no evidence of dilated bowel loops or free intraperitoneal air. Status post cholecystectomy. Surgical clips are seen in the pelvis. Heart size and mediastinal contours are within normal limits. Both lungs are clear. IMPRESSION: No evidence of bowel obstruction or ileus. No acute cardiopulmonary disease. Electronically Signed   By: Marijo Conception, M.D.   On: 08/30/2017 11:26    Medications / Allergies: per chart  Antibiotics: Anti-infectives (From admission, onward)   Start     Dose/Rate Route Frequency Ordered Stop   08/30/17 1000  ciprofloxacin (CIPRO) IVPB 200 mg     200 mg 100 mL/hr over 60 Minutes Intravenous Every 24 hours 08/30/17 0855          Note: Portions of this report may have been transcribed using voice recognition software. Every effort was made to ensure accuracy; however, inadvertent computerized transcription errors may be present.   Any transcriptional errors that result from this process are unintentional.     [name], M.D., F.A.C.S. Gastrointestinal and Minimally Invasive Surgery [facility] [address], [place], [place] [address] [phone]  Main / Paging    08/31/2017

## 2017-08-31 NOTE — Progress Notes (Signed)
[facility] Rounding Note  Subjective/Interval History:  Overall feeling some better Having yellow rectal drainage when she stands up Dr. Johney Maine brings up ?of urine leak and plans to speak with Dr. Tresa Moore Some SOB today and coughs w/deep breath  Objective Vital signs in last 24 hours: Vitals:   08/30/17 1411 08/30/17 1731 08/30/17 2125 08/31/17 0526  BP: 124/75 (!) 144/104 102/61 (!) 110/50  Pulse: (!) 115 (!) 111 (!) 113 (!) 105  Resp: 19 20 20 20   Temp: 98.2 F (36.8 C) 98 F (36.7 C) 98.4 F (36.9 C) 98.9 F (37.2 C)  TempSrc: Axillary Oral Oral Oral  SpO2: 91% 100% 100% 98%  Weight:      Height:       Weight change:   Intake/Output Summary (Last 24 hours) at 08/31/2017 0641 Last data filed at 08/31/2017 0600 Gross per 24 hour  Intake 3754.58 ml  Output 1690 ml  Net 2064.58 ml   Physical Exam:  Blood pressure (!) 110/50, pulse (!) 105, temperature 98.9 F (37.2 C), temperature source Oral, resp. rate 20, height 5\' 3"  (1.6 m), weight 82.9 kg (182 lb 12.2 oz), SpO2 98 %.  Physical Exam:  Blood pressure 110/64, pulse 88, temperature 98.4 F (36.9 C), temperature source Oral, resp. rate 17, height 5\' 3"  (1.6 m), weight 82.9 kg (182 lb 12.2 oz), SpO2 95 %.  Gen: Pale, anxious, tearful WF Extremely pale Shivering and cannot control that Hyperpneic/deep breathing but not tachypneic Lungs are grossly clear Normal heart sounds S1S2 No S3 No murmur Abd with ileostomy bag just emptied. Mildly tender in RLQ + BS No LE edema Alert, oriented   Recent Labs  Lab 08/29/17 1550 08/30/17 0623 08/31/17 0451  NA 129* 130* 128*  K 3.2* 3.0* 3.6  CL 106 107 105  CO2 11* 10* 15*  GLUCOSE 180* 182* 221*  BUN 79* 81* 69*  CREATININE 4.28* 4.24* 3.60*  CALCIUM 9.0 8.8* 8.1*  PHOS 4.6  --  2.0*   Recent Labs  Lab 08/29/17 1550 08/31/17 0451  AST 16  --   ALT 24  --   ALKPHOS 173*  --   BILITOT 0.5  --   PROT 6.8  --   ALBUMIN 2.1* 1.7*    Recent Labs  Lab 08/30/17 0623  08/31/17 0451  WBC 2.8* 1.7*  NEUTROABS 1.8  --   HGB 9.4* 7.9*  HCT 26.8* 22.5*  MCV 84.3 83.6  PLT 222 204    Recent Labs  Lab 08/29/17 2139 08/30/17 0758 08/30/17 1228 08/30/17 1638 08/30/17 2117  GLUCAP 159* 158* 241* 218* 181*    Studies/Results: US Renal  Result Date: 08/30/2017 CLINICAL DATA:  62 year old female with history of acute renal failure. History of ureteral stricture. EXAM: RENAL / URINARY TRACT ULTRASOUND COMPLETE COMPARISON:  No prior renal ultrasound. CT the abdomen and pelvis 08/25/2017. FINDINGS: Right Kidney: Length: 10 cm. Echogenicity within normal limits. No mass or hydronephrosis visualized. 7 mm echogenic focus in the interpolar region of the right kidney with some mild posterior acoustic shadowing, compatible with a calculus. Left Kidney: Length: 14.9 cm. Echogenicity within normal limits. Mild to moderate hydronephrosis. No mass visualized. Echogenic focus measuring 6 mm in the lower pole with some posterior acoustic shadowing, compatible with a calculus. Bladder: Under distended and poorly visualized. IMPRESSION: 1. Small nonobstructive calculi in the collecting systems of both kidneys. 2. Mild to moderate hydronephrosis. Electronically Signed   By: Vinnie Langton M.D.   On: 08/30/2017 09:42  Dg Abd Acute W/chest  Result Date: 08/30/2017 CLINICAL DATA:  Shortness of breath on exertion. EXAM: DG ABDOMEN ACUTE W/ 1V CHEST COMPARISON:  None. FINDINGS: There is no evidence of dilated bowel loops or free intraperitoneal air. Status post cholecystectomy. Surgical clips are seen in the pelvis. Heart size and mediastinal contours are within normal limits. Both lungs are clear. IMPRESSION: No evidence of bowel obstruction or ileus. No acute cardiopulmonary disease. Electronically Signed   By: Marijo Conception, M.D.   On: 08/30/2017 11:26   Medications: . chlorproMAZINE (THORAZINE) IV    . ciprofloxacin Stopped (08/30/17 1023)  . magnesium sulfate 1 - 4 g bolus  IVPB 4 g (08/31/17 0626)  . methocarbamol (ROBAXIN)  IV    .  sodium bicarbonate  infusion 1000 mL 100 mL/hr at 08/30/17 1628   . anastrozole  1 mg Oral Daily  . [START ON 09/04/2017] cholecalciferol  10,000 Units Oral Weekly  . diphenoxylate-atropine  1-2 tablet Oral Q8H  . enoxaparin (LOVENOX) injection  30 mg Subcutaneous Q24H  . famotidine  20 mg Oral Daily  . feeding supplement  237 mL Oral BID BM  . gabapentin  300 mg Oral QHS  . insulin aspart  0-15 Units Subcutaneous TID WC  . insulin aspart  0-5 Units Subcutaneous QHS  . levothyroxine  150 mcg Oral QAC breakfast  . lip balm  1 application Topical BID  . loratadine  10 mg Oral q morning - 10a  . metoCLOPramide  5 mg Oral TID AC  . polyvinyl alcohol  1 drop Both Eyes QHS  . prenatal multivitamin  1 tablet Oral Daily  . psyllium  1 packet Oral Daily  . rosuvastatin  10 mg Oral QPM  . [START ON 09/05/2017] Tbo-Filgrastim  480 mcg Subcutaneous Weekly   Assessment/Recommendations  1. Acute kidney injury - Volume depletion top of the list. 2/2 inadequate intake in face of high output ileostomy  Radiology calls mild-mod hydro on [place], Dr. Tresa Moore indicates not significant by his view. Whatever this is has been present since at least 08/17/17 (SCr 3 when stent removed) and worsening. Could certainly have ATN from prolonged volume depletion. Question raised of urine leak given ongoing prob with yellow rectal drainage. Getting fluids now, supine BP OK, some improvement last 24 hours 1. Trend labs and UOP 2. Continue IVF (but change to isotonic bicarb see below) 3. Dr. Johney Maine to address with Dr. Tresa Moore ? of evaluation for urine leak 2. Metabolic acidosis  - on metformin with AKI accounts for acidosis. Probably also contributes to her sensation of dyspnea with clear lungs. Improving some on isotonic bicarb 1. Continue isotonic bicarbonate at 100 cc/hour; stop LR 2. Hold metformin (of course) 3. Hypokalemia - poor intake. Improved after 3 runs  yesterday. Trend labs 4. Hypomagnesemia - Mag low so will replete (Dr. Johney Maine has already ordered) 5. Anemia - Hb down to 7.9 with hydration. Iron studies pending.  6. Shivering - Blood and urine cultures pending. WBC low Has chronic neutropenia so may be unable to generate WBC response. Toxic grans on smear. Already getting cipro. 7. Chronic neutropenia - last dosed herself with filagrastin on PM prior to admission (so 1/27) If WBC lower tomorrow will redose then.  [name], MD [facility] [phone] pager 08/31/2017, 6:41 AM

## 2017-08-31 NOTE — Progress Notes (Signed)
OT Cancellation Note  Patient Details Name: [name] MRN: [id number] DOB: 1955/08/03   Cancelled Treatment:    Reason Eval/Treat Not Completed: Fatigue/lethargy limiting ability to participate  Pt ask OT to return in the morning. She had just returned from CT scan.  [name], Laura  [name] 08/31/2017, 1:47 PM

## 2017-08-31 NOTE — Consult Note (Signed)
[place] Nurse wound consult note Reason for Consult: 2 right medial partial thickness buttock wounds, MASD leaking stool from rectum. Wound type: MASD IAD Pressure Injury POA: NA Measurement: two 1cm x 1cm x 0.1cm partial thickness wounds Wound TJL:LVDI Drainage (amount, consistency, odor) none Periwound: excoriated from fecal leakage Dressing procedure/placement/frequency: I have provided nurses with orders for Hydrocolloid to two open wounds on medial right buttock and use of Purple Top Crit[employer] Barrier ointment to heal area around it. Patient and husband educated on use of both products. Husband seems very involved and concerned about making sure he knows exactly what to do for her. Patient crying most of visit today. MD note states he will start antidepressant. [place] team will continue to follow while patient is in house.   Fara Olden, RN-C, WTA-C, [facility]

## 2017-09-01 ENCOUNTER — Encounter: Payer: Self-pay | Admitting: General Practice

## 2017-09-01 ENCOUNTER — Encounter: Payer: BLUE CROSS/BLUE SHIELD | Admitting: Family Medicine

## 2017-09-01 LAB — CBC WITH DIFFERENTIAL/PLATELET
BASOS ABS: 0 10*3/uL (ref 0.0–0.1)
Basophils Relative: 0 %
EOS ABS: 0.1 10*3/uL (ref 0.0–0.7)
Eosinophils Relative: 2 %
HCT: 24.5 % — ABNORMAL LOW (ref 36.0–46.0)
Hemoglobin: 8.4 g/dL — ABNORMAL LOW (ref 12.0–15.0)
LYMPHS ABS: 0.3 10*3/uL — AB (ref 0.7–4.0)
Lymphocytes Relative: 6 %
MCH: 29.3 pg (ref 26.0–34.0)
MCHC: 34.3 g/dL (ref 30.0–36.0)
MCV: 85.4 fL (ref 78.0–100.0)
MONO ABS: 0.8 10*3/uL (ref 0.1–1.0)
Monocytes Relative: 19 %
NEUTROS PCT: 73 %
Neutro Abs: 3.1 10*3/uL (ref 1.7–7.7)
Platelets: 200 10*3/uL (ref 150–400)
RBC: 2.87 MIL/uL — ABNORMAL LOW (ref 3.87–5.11)
RDW: 14.8 % (ref 11.5–15.5)
WBC: 4.3 10*3/uL (ref 4.0–10.5)

## 2017-09-01 LAB — MAGNESIUM: MAGNESIUM: 2 mg/dL (ref 1.7–2.4)

## 2017-09-01 LAB — RENAL FUNCTION PANEL
ALBUMIN: 1.6 g/dL — AB (ref 3.5–5.0)
Anion gap: 9 (ref 5–15)
BUN: 61 mg/dL — AB (ref 6–20)
CO2: 19 mmol/L — ABNORMAL LOW (ref 22–32)
CREATININE: 3.46 mg/dL — AB (ref 0.44–1.00)
Calcium: 7.6 mg/dL — ABNORMAL LOW (ref 8.9–10.3)
Chloride: 98 mmol/L — ABNORMAL LOW (ref 101–111)
GFR, EST AFRICAN AMERICAN: 15 mL/min — AB (ref >60)
GFR, EST NON AFRICAN AMERICAN: 13 mL/min — AB (ref >60)
Glucose, Bld: 306 mg/dL — ABNORMAL HIGH (ref 65–99)
PHOSPHORUS: 2.5 mg/dL (ref 2.5–4.6)
Potassium: 3 mmol/L — ABNORMAL LOW (ref 3.5–5.1)
Sodium: 126 mmol/L — ABNORMAL LOW (ref 135–145)

## 2017-09-01 LAB — BPAM RBC
Blood Product Expiration Date: 201902142359
ISSUE DATE / TIME: 201901302212
Unit Type and Rh: 6200

## 2017-09-01 LAB — TYPE AND SCREEN
ABO/RH(D): A POS
Antibody Screen: NEGATIVE
Unit division: 0

## 2017-09-01 LAB — URINE CULTURE: Culture: >=100000 — AB

## 2017-09-01 LAB — GLUCOSE, CAPILLARY
GLUCOSE-CAPILLARY: 250 mg/dL — AB (ref 65–99)
Glucose-Capillary: 352 mg/dL — ABNORMAL HIGH (ref 65–99)
Glucose-Capillary: 262 mg/dL — ABNORMAL HIGH (ref 65–99)
Glucose-Capillary: 272 mg/dL — ABNORMAL HIGH (ref 65–99)

## 2017-09-01 LAB — HEMOGLOBIN A1C
HEMOGLOBIN A1C: 7.9 % — AB (ref 4.8–5.6)
MEAN PLASMA GLUCOSE: 180.03 mg/dL

## 2017-09-01 MED ORDER — INSULIN ASPART 100 UNIT/ML ~~LOC~~ SOLN
0.0000 [IU] | Freq: Every day | SUBCUTANEOUS | Status: DC
  Administered 2017-09-01: 3 [IU] via SUBCUTANEOUS
  Administered 2017-09-02 – 2017-09-03 (×2): 2 [IU] via SUBCUTANEOUS
  Administered 2017-09-04: 4 [IU] via SUBCUTANEOUS
  Administered 2017-09-05 – 2017-09-18 (×4): 2 [IU] via SUBCUTANEOUS

## 2017-09-01 MED ORDER — INSULIN ASPART 100 UNIT/ML ~~LOC~~ SOLN
0.0000 [IU] | Freq: Three times a day (TID) | SUBCUTANEOUS | Status: DC
  Administered 2017-09-01: 11 [IU] via SUBCUTANEOUS
  Administered 2017-09-02: 15 [IU] via SUBCUTANEOUS
  Administered 2017-09-02 – 2017-09-03 (×3): 4 [IU] via SUBCUTANEOUS
  Administered 2017-09-03: 3 [IU] via SUBCUTANEOUS
  Administered 2017-09-03: 15 [IU] via SUBCUTANEOUS
  Administered 2017-09-04: 4 [IU] via SUBCUTANEOUS
  Administered 2017-09-04: 7 [IU] via SUBCUTANEOUS
  Administered 2017-09-04: 20 [IU] via SUBCUTANEOUS
  Administered 2017-09-05: 11 [IU] via SUBCUTANEOUS
  Administered 2017-09-05: 20 [IU] via SUBCUTANEOUS
  Administered 2017-09-05: 4 [IU] via SUBCUTANEOUS
  Administered 2017-09-06: 3 [IU] via SUBCUTANEOUS
  Administered 2017-09-06: 11 [IU] via SUBCUTANEOUS
  Administered 2017-09-06: 15 [IU] via SUBCUTANEOUS
  Administered 2017-09-07 – 2017-09-09 (×4): 7 [IU] via SUBCUTANEOUS
  Administered 2017-09-09 – 2017-09-10 (×2): 3 [IU] via SUBCUTANEOUS
  Administered 2017-09-10: 4 [IU] via SUBCUTANEOUS
  Administered 2017-09-11: 3 [IU] via SUBCUTANEOUS
  Administered 2017-09-11: 4 [IU] via SUBCUTANEOUS
  Administered 2017-09-12 (×2): 3 [IU] via SUBCUTANEOUS
  Administered 2017-09-13: 11 [IU] via SUBCUTANEOUS
  Administered 2017-09-13: 3 [IU] via SUBCUTANEOUS
  Administered 2017-09-14: 7 [IU] via SUBCUTANEOUS
  Administered 2017-09-15: 4 [IU] via SUBCUTANEOUS
  Administered 2017-09-15: 11 [IU] via SUBCUTANEOUS
  Administered 2017-09-16: 4 [IU] via SUBCUTANEOUS
  Administered 2017-09-16: 3 [IU] via SUBCUTANEOUS
  Administered 2017-09-17: 7 [IU] via SUBCUTANEOUS
  Administered 2017-09-17 – 2017-09-18 (×2): 4 [IU] via SUBCUTANEOUS
  Administered 2017-09-18: 3 [IU] via SUBCUTANEOUS
  Administered 2017-09-19: 7 [IU] via SUBCUTANEOUS
  Administered 2017-09-19: 3 [IU] via SUBCUTANEOUS
  Administered 2017-09-20 – 2017-09-23 (×7): 4 [IU] via SUBCUTANEOUS
  Administered 2017-09-23 – 2017-09-24 (×2): 3 [IU] via SUBCUTANEOUS
  Administered 2017-09-24: 4 [IU] via SUBCUTANEOUS

## 2017-09-01 MED ORDER — INSULIN GLARGINE 100 UNIT/ML ~~LOC~~ SOLN
10.0000 [IU] | Freq: Every day | SUBCUTANEOUS | Status: DC
  Administered 2017-09-01: 10 [IU] via SUBCUTANEOUS
  Filled 2017-09-01: qty 0.1

## 2017-09-01 MED ORDER — POTASSIUM CHLORIDE 10 MEQ/100ML IV SOLN
10.0000 meq | INTRAVENOUS | Status: AC
  Administered 2017-09-01 (×3): 10 meq via INTRAVENOUS
  Filled 2017-09-01 (×3): qty 100

## 2017-09-01 MED ORDER — PREMIER PROTEIN SHAKE
11.0000 [oz_av] | Freq: Two times a day (BID) | ORAL | Status: DC
Start: 2017-09-01 — End: 2017-09-05
  Administered 2017-09-01 – 2017-09-04 (×4): 11 [oz_av] via ORAL

## 2017-09-01 NOTE — Progress Notes (Signed)
Patient's hemoglobin was  7.9 , Ordered for transfuse one unit of PRBC. Blood transfusion completed without any reaction. 2nd RN sophia,  Verified blood at bedside.

## 2017-09-01 NOTE — Progress Notes (Signed)
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 She underwent a prolonged procedure 04/01/2017 with colostomy takedown, ileocecal resection with coloanal anastomosis and diverting loop ileostomy, 5 hours of LOA, and right ureteral stent exchange with psoas bladder hitch. Since that time she reported significant ileostomy output and was felt not to be keeping up with per oral intake. Had ureteral stent removed Jan 2019, creatinine noted to be up to 3 from baseline of 1. Had been given IVF's as outpatient, but labs showed acute renal failure with creatinine 4.28, so patient admitted by surgery on 1/28. Nephrology, urology, and hospitalists consulted. CT showed new colovesical fistula.   Assessment & Plan: Principal Problem:   Colovesical fistula - delayed Active Problems:   Primary hypothyroidism   DM type 2 (diabetes mellitus, type 2) ([place])   History of ovarian & endometrial cancer   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 s/p resection/partial vaginectomy/ LAR/colostomy 11/19/2014   Morbid obesity (HCC)   Ureteral stricture, right, 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) 04/01/2017   Pelvic cancer s/p colostomy takedown/loop ileostomy diversion 04/01/2017   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   Protein-calorie malnutrition, moderate (HCC)   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HCC)   Chronic kidney disease (CKD), stage III (moderate) (HCC)   Dehydration   ARF (acute renal failure) (HCC)   Adjustment disorder with mixed anxiety and depressed mood   Poorly controlled diabetes mellitus (HCC)   Pressure injury of skin   History of external beam radiation therapy to  pelvis  Acute renal failure on stage II CKD: Suspected prerenal etiology (both decreased po intake and high output ileostomy) without imaging evidence of urinary obstruction. As Dr. Tresa Moore points out, BUN has been elevated since ileostomy.  - Nephrology consulted - On isotonic bicarb with improvement in metabolic acidosis. Decreased rate today with crackles developing on lung exam, some edema dependently. May need diuretic.  - Trend BMP, UOP.   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 - Drs. Gross and Manny to discuss plans further - Foley placed to limit diversion, considering bilateral PCN's.   Recurrent endometrial CA s/p resection, adjuvant radiation, partial vaginectomy, LAR/colostomy April 2016. No chemotherapy due to idiopathic neutropenia. History of breast CA s/p bilateral mastectomies: Stage IIA, ER/PR positive. Followed by Dr. Denman [name].  - Continue arimidex   Chronic idiopathic neutropenia: Improved.  - Check CBC w/diff - May need repeat GCSF stim.  - Followed by Dr. Alvy Bimler who I have added to the treatment team.   Iron deficiency anemia: - Hgb 7.9 > 8.4 after 1u PRBCs 1/31. Continue monitoring.  High output ileostomy:  - WOC consulted  Polymicrobial UTI: >100k Serratia marcescens, >80k GBS on UCx, susceptibilities pending. CXR without infiltrate, no cutaneous infection/PICC site/ostomy issues.  - Monitor blood cultures - Ciprofloxacin should cover both organisms. Would probably prefer cephalosporin, though she's had serious reactions to PCNs in the past and allergy documented to cefaclor. Will continue cipro empirically.   Labile affect, adjustment disorder, possible depression: No current SI/HI.  - Zoloft started at admission.  - Would benefit from counseling as outpatient. Reports having significant social supports in place.  T2DM: HbA1c 5.3% in Aug 2018, now up to 7.9%.  - Holding  oral agents including metformin of course - Becoming more hyperglycemic: Augment to  resistant SSI and continue HS correction, start 10u lantus tonight. Titrate based on requirements.   Moderate protein-calorie malnutrition: Suspect adding protein would benefit patient even with renal failure, but defer to nephrology if protein should be restricted.  - RD consulted, supplement as pt can tolerate.   Hypokalemia, hypomagnesemia:  - Replacement per nephrology.  Hypothyroidism: TSH 2.408 - Continue stable dose synthroid.   Hyperlipidemia:  - Continue statin   DVT prophylaxis: Lovenox 30mg  Code Status: Full Family Communication: Brother at bedside Disposition Plan: Uncertain, suspect SNF will be needed  Consultants:   Hospitalists  Nephrology  Urology  Procedures:   None  Antimicrobials:  Ciprofloxacin 1/29 >>   Subjective: Very weak, despondent about new developments, anxious about prospect of PCN tubes. Left hand dominant and developing small blisters on forefinger and thumb over the past few days, no where else including mouth. Only new exposure she can think of is with a crayon.   Objective: Vitals:   08/31/17 2250 08/31/17 2320 09/01/17 0115 09/01/17 0544  BP: (!) 102/43 (!) 103/41 (!) 110/46 (!) 104/58  Pulse: 91 91 89 89  Resp: 18 18 18 16   Temp: 99 F (37.2 C) 99.3 F (37.4 C) 99.1 F (37.3 C) 98.9 F (37.2 C)  TempSrc: Axillary Axillary Axillary Oral  SpO2: 100% 100% 100% 100%  Weight:      Height:        Intake/Output Summary (Last 24 hours) at 09/01/2017 1258 Last data filed at 09/01/2017 1100 Gross per 24 hour  Intake 2338 ml  Output 3375 ml  Net -1037 ml   Filed Weights   08/30/17 0552  Weight: 82.9 kg (182 lb 12.2 oz)   Gen: Chronically ill-appearing 62 y.o. female in no distress Pulm: Non-labored borderline tachypnea on room air. Clear to auscultation bilaterally.  CV: Regular tachycardia. No murmur, rub, or gallop. No JVD, no pedal edema. GI: Abdomen soft, mild tenderness diffusely without rebound or guarding,  non-distended, with normoactive bowel sounds. No organomegaly or masses felt. Ext: Warm, no deformities Skin: Ileostomy site without tenderness, erythema, discharge; DL PICC right brachial c/d/i. Alopecia noted. Left index finger and thumb with intact blistering.  Neuro: Alert and oriented. No focal neurological deficits. Psych: Judgement and insight appear normal. Mood labile with congruent, broad affect.    CBC: Recent Labs  Lab 08/30/17 0623 08/31/17 0451 09/01/17 0406  WBC 2.8* 1.7* 4.3  NEUTROABS 1.8  --  3.1  HGB 9.4* 7.9* 8.4*  HCT 26.8* 22.5* 24.5*  MCV 84.3 83.6 85.4  PLT 222 204 462   Basic Metabolic Panel: Recent Labs  Lab 08/29/17 1550 08/30/17 0623 08/31/17 0451 09/01/17 0406  NA 129* 130* 128* 126*  K 3.2* 3.0* 3.6 3.0*  CL 106 107 105 98*  CO2 11* 10* 15* 19*  GLUCOSE 180* 182* 221* 306*  BUN 79* 81* 69* 61*  CREATININE 4.28* 4.24* 3.60* 3.46*  CALCIUM 9.0 8.8* 8.1* 7.6*  MG 1.1* 1.1* 1.3* 2.0  PHOS 4.6  --  2.0* 2.5   Liver Function Tests: Recent Labs  Lab 08/29/17 1550 08/31/17 0451 09/01/17 0406  AST 16  --   --   ALT 24  --   --   ALKPHOS 173*  --   --   BILITOT 0.5  --   --   PROT 6.8  --   --   ALBUMIN 2.1* 1.7* 1.6*   HbA1C: Recent Labs  09/01/17 0406  HGBA1C 7.9*   CBG: Recent Labs  Lab 08/31/17 1153 08/31/17 1645 08/31/17 2106 09/01/17 0750 09/01/17 1137  GLUCAP 328* 241* 308* 250* 352*   Thyroid Function Tests: Recent Labs    08/30/17 0623  TSH 2.408   Anemia Panel: Recent Labs    08/31/17 0451  TIBC 164*  IRON 7*   Urine analysis:    Component Value Date/Time   COLORURINE YELLOW 08/30/2017 1240   APPEARANCEUR CLOUDY (A) 08/30/2017 1240   LABSPEC 1.006 08/30/2017 1240   LABSPEC 1.015 11/17/2015 1436   PHURINE 5.0 08/30/2017 1240   GLUCOSEU NEGATIVE 08/30/2017 1240   GLUCOSEU 2,000 11/17/2015 1436   HGBUR MODERATE (A) 08/30/2017 1240   BILIRUBINUR NEGATIVE 08/30/2017 1240   BILIRUBINUR neg 08/30/2016  1025   BILIRUBINUR Negative 11/17/2015 1436   KETONESUR NEGATIVE 08/30/2017 1240   PROTEINUR 100 (A) 08/30/2017 1240   UROBILINOGEN negative 08/30/2016 1025   UROBILINOGEN 0.2 11/17/2015 1436   NITRITE NEGATIVE 08/30/2017 1240   LEUKOCYTESUR LARGE (A) 08/30/2017 1240   LEUKOCYTESUR Moderate 11/17/2015 1436   Recent Results (from the past 240 hour(s))  Culture, Urine     Status: Abnormal   Collection Time: 08/30/17 10:10 AM  Result Value Ref Range Status   Specimen Description URINE, RANDOM  Final   Special Requests NONE  Final   Culture (A)  Final    >=100,000 COLONIES/mL SERRATIA MARCESCENS 80,000 COLONIES/mL GROUP B STREP(S.AGALACTIAE)ISOLATED TESTING AGAINST S. AGALACTIAE NOT ROUTINELY PERFORMED DUE TO PREDICTABILITY OF AMP/PEN/VAN SUSCEPTIBILITY. Performed at [facility], [place] [address] [place], [place] [address]    Report Status 09/01/2017 FINAL  Final   Organism ID, Bacteria SERRATIA MARCESCENS (A)  Final      Susceptibility   Serratia marcescens - MIC*    CEFAZOLIN >=64 RESISTANT Resistant     CEFTRIAXONE <=1 SENSITIVE Sensitive     CIPROFLOXACIN 1 SENSITIVE Sensitive     GENTAMICIN <=1 SENSITIVE Sensitive     NITROFURANTOIN 256 RESISTANT Resistant     TRIMETH/SULFA <=20 SENSITIVE Sensitive     * >=100,000 COLONIES/mL SERRATIA MARCESCENS  Culture, blood (Routine X 2) w Reflex to ID Panel     Status: None (Preliminary result)   Collection Time: 08/30/17  3:26 PM  Result Value Ref Range Status   Specimen Description BLOOD BLOOD LEFT HAND  Final   Special Requests IN PEDIATRIC BOTTLE Blood Culture adequate volume  Final   Culture   Final    NO GROWTH 2 DAYS Performed at [facility], [place] [address] [place], [place] [address]    Report Status PENDING  Incomplete  Culture, blood (Routine X 2) w Reflex to ID Panel     Status: None (Preliminary result)   Collection Time: 08/30/17  3:30 PM  Result Value Ref Range Status   Specimen Description BLOOD LEFT  ANTECUBITAL  Final   Special Requests IN PEDIATRIC BOTTLE Blood Culture adequate volume  Final   Culture   Final    NO GROWTH 2 DAYS Performed at [name], [place] [address] [place], [place] [address]    Report Status PENDING  Incomplete      Radiology Studies: Ct Abdomen Pelvis Wo Contrast  Result Date: 08/31/2017 CLINICAL DATA:  62 year old female status post bladder reconstruction in August 2018 for urinary leak. History of ovarian and uterine cancer in 2006 with recurrence in 2016 treated with chemotherapy and radiation therapy. EXAM: CT ABDOMEN AND PELVIS WITHOUT CONTRAST TECHNIQUE: Multidetector CT  imaging of the abdomen and pelvis was performed following the standard protocol without IV contrast. COMPARISON:  CT the abdomen and pelvis 08/25/2017. FINDINGS: Lower chest: Bilateral breast implants incidentally noted. Otherwise, unremarkable. Hepatobiliary: Mild diffuse low attenuation throughout the hepatic parenchyma indicative of a background of mild hepatic steatosis. Calcified granuloma in the right lobe of the liver. No definite suspicious cystic or solid hepatic lesions are noted on today's noncontrast CT examination. Status post cholecystectomy. Pancreas: No pancreatic mass or peripancreatic inflammatory changes are noted on today's noncontrast CT examination. Spleen: Unremarkable. Adrenals/Urinary Tract: Moderate atrophy of the right kidney. Mild bilateral hydroureteronephrosis. Reflux of iodinated contrast material into the right renal collecting system. Nonobstructive calculi in the lower pole collecting system of left kidney, similar to prior study from 08/25/2017, measuring up to 11 mm. Urinary bladder is partially decompressed around an indwelling Foley catheter. Rectovesical fistula best appreciated on axial image 76 of series 2, sagittal images 40-53 of series 5 and coronal images 55-60 of series 4, with large amount of either native contrast material extending from the urinary  bladder into the rectum. Bilateral adrenal glands are normal in appearance. Stomach/Bowel: Normal appearance of the stomach. No pathologic dilatation of small bowel or colon. Postoperative changes of partial colectomy and small bowel resections are noted, with right upper quadrant ostomy. Adjacent to the rectovesical fistula there is a short extension of contrast which extends posteriorly into the presacral space and extends slightly cephalad, presumably a fistulous tract (best demonstrated on axial image 71 of series 2, sagittal images 50-58 of series 5 and coronal images 57-64 of series 4. Vascular/Lymphatic: No atherosclerotic calcifications in the abdominal or pelvic vasculature. No lymphadenopathy noted in the abdomen or pelvis. Reproductive: Status post total abdominal hysterectomy and bilateral salpingo-oophorectomy. Other: Trace volume of ascites in the low anatomic pelvis. No pneumoperitoneum. Musculoskeletal: There are no aggressive appearing lytic or blastic lesions noted in the visualized portions of the skeleton. IMPRESSION: 1. Rectovesical fistula, as above. 2. 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 3. Reflux of contrast material into the right kidney via the right ureter with moderate atrophy of the right kidney. Nonobstructive calculi in the lower pole collecting system of the left kidney measuring up to 11 mm. 4. Additional incidental findings, as above. These results will be called to the ordering clinician or representative by the Radiologist Assistant, and communication documented in the PACS or zVision Dashboard. Electronically Signed   By: Vinnie Langton M.D.   On: 08/31/2017 14:34   Dg Chest 2 View  Result Date: 08/31/2017 CLINICAL DATA:  Shortness of breath. EXAM: CHEST  2 VIEW COMPARISON:  Radiographs of August 30, 2017. FINDINGS: The heart size and mediastinal  contours are within normal limits. Both lungs are clear. No pneumothorax or pleural effusion is noted. Right-sided PICC line is unchanged in position. The visualized skeletal structures are unremarkable. IMPRESSION: No active cardiopulmonary disease. Electronically Signed   By: Marijo Conception, M.D.   On: 08/31/2017 10:06    Scheduled Meds: . anastrozole  1 mg Oral Daily  . [START ON 09/04/2017] cholecalciferol  10,000 Units Oral Weekly  . diphenoxylate-atropine  1-2 tablet Oral Q8H  . enoxaparin (LOVENOX) injection  30 mg Subcutaneous Q24H  . famotidine  20 mg Oral Daily  . insulin aspart  0-15 Units Subcutaneous TID WC  . insulin aspart  0-5 Units Subcutaneous QHS  . levothyroxine  150  mcg Oral QAC breakfast  . lip balm  1 application Topical BID  . loratadine  10 mg Oral q morning - 10a  . metoCLOPramide  5 mg Oral TID AC  . polyvinyl alcohol  1 drop Both Eyes QHS  . prenatal multivitamin  1 tablet Oral Daily  . protein supplement shake  11 oz Oral BID BM  . psyllium  1 packet Oral Daily  . rosuvastatin  10 mg Oral QPM  . [START ON 09/05/2017] Tbo-Filgrastim  480 mcg Subcutaneous Weekly   Continuous Infusions: . sodium chloride    . chlorproMAZINE (THORAZINE) IV    . ciprofloxacin Stopped (09/01/17 1207)  . methocarbamol (ROBAXIN)  IV    . potassium chloride Stopped (09/01/17 1252)  .  sodium bicarbonate  infusion 1000 mL 50 mL/hr at 09/01/17 0845     LOS: 3 days   Time spent: 35 minutes.  Vance Gather, MD [facility] [email or web address] Password [facility] 09/01/2017, 12:58 PM

## 2017-09-01 NOTE — Progress Notes (Signed)
PT Cancellation Note  Patient Details Name: [name] MRN: [id number] DOB: 01-Nov-1955   Cancelled Treatment:     pain and just got comfortable.  Pt has been evaluated with rec for  OP   Rica Koyanagi  PTA [facility]  Acute  Rehab Pager      [phone]

## 2017-09-01 NOTE — Progress Notes (Signed)
Subjective/Chief Complaint:   1 - Bladder Injury / Right Ureteral Stricture - s/p robotic RIGHT ureteral reimplant with psoas hitch + HM Flap 04/01/17 at time of colostomy take down / adhesiolysis, small bowel resection, loop iliostomy for right distal stricture / bladder injury sustained at pelvic resection for advanced endometrial cancer 2016. Stent removed 08/17/17. FU CT 08/25/17 with resolved right hydro.   2 - Metastatic Endometrial Cancer - s/p repeat resection [phone] with colon and vaginal involvement but negative nodes / margins. Had adjuvant chemo-XRT which she is now done with. Follows with Dr. Alvy Bimler.   3 - Acute on Chronic Renal Failure / Severe Dehydration - pt with high output iliostomy, difficult to maintain hydration, has been on PRN IVF. Cr 4's on admit late 08/2017 up from baseline <1.5. CT few days ago without hydro. Her BUN / Cr ratio has been 20 or more since iliostomy. Renal US1/29 with some mild (stable) caliectasis that is stable x months and no right hydro at all.  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   4 - Bacteruria / Malaise- pt with bacteruria on admit. No fevers / leukocytosis. Most recent clonal CX 07/2017 (when still had foley in place) serratia and enterococcus sens cipro. [name] 1/29 - serratia / pending.   5 - Rectal - Bladder Fistula - some drainage of yellowiosh thin fluid from peri-rectal area with standing per report new late 08/2017. CT cystogram 1/30 confims NEW bladder neck - rectal fistula with large volume urine transit into GI tract distal to loop iliostomy.  PMH sig for DM2, morbid obesity, breast cancer, ovarian cancer, endometrial cancer.    Today "[name]" is stable, she is understandibly frustrated by situation with new fistula. Her nutritional and functional status are quite poor.   Objective: Vital signs in last 24  hours: Temp:  [97.9 F (36.6 C)-99.3 F (37.4 C)] 97.9 F (36.6 C) (01/31 1409) Pulse Rate:  [89-120] 100 (01/31 1409) Resp:  [16-18] 16 (01/31 1409) BP: (95-110)/(41-58) 95/51 (01/31 1409) SpO2:  [100 %] 100 % (01/31 1409) Last BM Date: 09/01/17  Intake/Output from previous day: 01/30 0701 - 01/31 0700 In: 3163 [P.O.:1045; I.V.:1700; Blood:318; IV Piggyback:100] Out: 6433 [Urine:2550; Stool:375] Intake/Output this shift: Total I/O In: 240 [P.O.:240] Out: 600 [Urine:350; Stool:250]  General appearance: alert and with visible malaise and anxiety, husband at bedside as well.  Eyes: negative Nose: Nares normal. Septum midline. Mucosa normal. No drainage or sinus tenderness. Throat: lips, mucosa, and tongue normal; teeth and gums normal Neck: supple, symmetrical, trachea midline Back: symmetric, no curvature. ROM normal. No CVA tenderness. GI: stable large truncal obesity. Iliostomy patent of stool. Multiple scars w/o hernias. This yellow rectal drainage c/w known fistula.  Pelvic: external genitalia normal. Foley in place with yellow urine.  Extremities: extremities normal, atraumatic, no cyanosis or edema Skin: Skin color, texture, turgor normal. No rashes or lesions Lymph nodes: Cervical, supraclavicular, and axillary nodes normal. Neurologic: Grossly normal   Lab Results:  Recent Labs    08/31/17 0451 09/01/17 0406  WBC 1.7* 4.3  HGB 7.9* 8.4*  HCT 22.5* 24.5*  PLT 204 200   BMET Recent Labs    08/31/17 0451 09/01/17 0406  NA 128* 126*  K 3.6 3.0*  CL 105 98*  CO2 15* 19*  GLUCOSE 221* 306*  BUN 69* 61*  CREATININE 3.60* 3.46*  CALCIUM 8.1* 7.6*  PT/INR No results for input(s): LABPROT, INR in the last 72 hours. ABG No results for input(s): PHART, HCO3 in the last 72 hours.  Invalid input(s): PCO2, PO2  Studies/Results: Ct Abdomen Pelvis Wo Contrast  Result Date: 08/31/2017 CLINICAL DATA:  62 year old female status post bladder reconstruction in  August 2018 for urinary leak. History of ovarian and uterine cancer in 2006 with recurrence in 2016 treated with chemotherapy and radiation therapy. EXAM: CT ABDOMEN AND PELVIS WITHOUT CONTRAST TECHNIQUE: Multidetector CT imaging of the abdomen and pelvis was performed following the standard protocol without IV contrast. COMPARISON:  CT the abdomen and pelvis 08/25/2017. FINDINGS: Lower chest: Bilateral breast implants incidentally noted. Otherwise, unremarkable. Hepatobiliary: Mild diffuse low attenuation throughout the hepatic parenchyma indicative of a background of mild hepatic steatosis. Calcified granuloma in the right lobe of the liver. No definite suspicious cystic or solid hepatic lesions are noted on today's noncontrast CT examination. Status post cholecystectomy. Pancreas: No pancreatic mass or peripancreatic inflammatory changes are noted on today's noncontrast CT examination. Spleen: Unremarkable. Adrenals/Urinary Tract: Moderate atrophy of the right kidney. Mild bilateral hydroureteronephrosis. Reflux of iodinated contrast material into the right renal collecting system. Nonobstructive calculi in the lower pole collecting system of left kidney, similar to prior study from 08/25/2017, measuring up to 11 mm. Urinary bladder is partially decompressed around an indwelling Foley catheter. 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 Bilateral adrenal glands are normal in appearance. Stomach/Bowel: Normal appearance of the stomach. No pathologic dilatation of small bowel or colon. Postoperative changes of partial colectomy and small bowel resections are noted, with right upper quadrant ostomy. Adjacent to the rectovesical fistula there is a short extension of contrast which extends posteriorly into the  presacral space and extends slightly cephalad, presumably a fistulous tract (best demonstrated on axial image 71 of series 2, sagittal images 50-58 of series 5 and coronal images 57-64 of series 4. Vascular/Lymphatic: No atherosclerotic calcifications in the abdominal or pelvic vasculature. No lymphadenopathy noted in the abdomen or pelvis. Reproductive: Status post total abdominal hysterectomy and bilateral salpingo-oophorectomy. Other: Trace volume of ascites in the low anatomic pelvis. No pneumoperitoneum. Musculoskeletal: There are no aggressive appearing lytic or blastic lesions noted in the visualized portions of the skeleton. IMPRESSION: 1. Rectovesical fistula, as above. 2. 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 3. Reflux of contrast material into the right kidney via the right ureter with moderate atrophy of the right kidney. Nonobstructive calculi in the lower pole collecting system of the left kidney measuring up to 11 mm. 4. Additional incidental findings, as above. These results will be called to the ordering clinician or representative by the Radiologist Assistant, and communication documented in the PACS or zVision Dashboard. Electronically Signed   By: Vinnie Langton M.D.   On: 08/31/2017 14:34   Dg Chest 2 View  Result Date: 08/31/2017 CLINICAL DATA:  Shortness of breath. EXAM: CHEST  2 VIEW COMPARISON:  Radiographs of August 30, 2017. FINDINGS: The heart size and mediastinal contours are within normal limits. Both lungs are clear. No pneumothorax or pleural effusion is noted. Right-sided PICC line is unchanged in position. The visualized skeletal structures are unremarkable. IMPRESSION: No active cardiopulmonary disease. Electronically Signed   By:  Marijo Conception, M.D.   On: 08/31/2017 10:06    Anti-infectives: Anti-infectives (From admission, onward)   Start      Dose/Rate Route Frequency Ordered Stop   08/30/17 1000  ciprofloxacin (CIPRO) IVPB 200 mg     200 mg 100 mL/hr over 60 Minutes Intravenous Every 24 hours 08/30/17 0855        Assessment/Plan:  1 - Bladder Injury / Right Ureteral Stricture - imaging post-repair favorable, less right hydro than in years which suggests succesfull repair. Her mild left caliectasis is chronic and stable accounting for different imaging modalities.   2 - Metastatic Endometrial Cancer - no evidence of active disease, per med-oncology.   3 - Acute on Chronic Renal Failure / Severe Dehydration - her acute BUN and Cr rise appears to be mostly pre-renal + exacerbation / artifact from systemic reabsorption via contact with colon via fistula.   Greatly appreciate nephrology comanagment.   4 - Bacteruria - continue low dose cipro pending further CX data.    5 - Rectal - Bladder Fistula - Pt and her husband have good understanding of short term and long term goals. Short term goal is to diver urine away from fistula, presently with foley. Should quantity of rectal urine drainage / hygeine remain problematic with foley alone, then we will  Need bilat neph tubes to further divert urine away. Long term she will likely require heroic attempt flap repair v. Permanent GI and GU diversions in several months when / if nutritional and functional status can be improved.   Will follow  Please call me directly with questions.   [facility], Marvyn Torrez 09/01/2017

## 2017-09-01 NOTE — Progress Notes (Signed)
OT Cancellation Note  Patient Details Name: [name] MRN: [id number] DOB: August 20, 1955   Cancelled Treatment:    Reason Eval/Treat Not Completed: Other (comment). RN states she just got pt settled and comfortable. She had been up a couple of times. Will check back tomorrow.  Kalei Meda 09/01/2017, 2:11 PM  Lesle Chris, OTR/L [phone] 09/01/2017

## 2017-09-01 NOTE — Progress Notes (Signed)
[address] [place], [place] [address] Phone: [phone]  FAX: [phone]      Phillis Thackeray [id number] [birth year]  CARE TEAM:  PCP: Ann Held, DO  Outpatient Care Team: Patient Care Team: Carollee Herter, Alferd Apa, DO as PCP - General (Family Medicine) Heath Lark, MD as Consulting Physician (Hematology and Oncology) Everitt Amber, MD as Consulting Physician (Obstetrics and Gynecology) Michael [name], MD as Consulting Physician (General Surgery) Alexis Frock, MD as Consulting Physician (Urology) Altheimer, Legrand Como, MD as Consulting Physician (Endocrinology) Katy Apo, MD as Consulting Physician (Ophthalmology) Irene Limbo, MD as Consulting Physician (Plastic Surgery) Madelon Lips, MD as Consulting Physician (Nephrology)  Inpatient Treatment Team: Treatment Team: Attending Provider: Michael [name], MD; Consulting Physician: Alexis Frock, MD; Registered Nurse: Illene Regulus, RN; Registered Nurse: Vicente Serene, RN; Technician: Reuel Derby, [place]; Consulting Physician: [name], MD; Consulting Physician: Fatima Blank, MD; Consulting Physician: Michael [name], MD; Technician: Leda Quail, NT; Occupational Therapist: [name], OT; Consulting Physician: Patrecia Pour, MD; Consulting Physician: Heath Lark, MD; Registered Nurse: Sunny Schlein, RN; Technician: Virgia Land, [place]   Problem List:   Principal Problem:   Colovesical fistula - delayed Active Problems:   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 s/p resection/partial vaginectomy/ LAR/colostomy 11/19/2014   Ureteral stricture, right, 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) 04/01/2017   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   Primary hypothyroidism   DM type 2 (diabetes mellitus, type 2) ([place])   History of ovarian & endometrial cancer   Morbid obesity ([place])   Pelvic  cancer s/p colostomy takedown/loop ileostomy diversion 04/01/2017   Protein-calorie malnutrition, moderate (HCC)   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HCC)   Chronic kidney disease (CKD), stage III (moderate) (HCC)   Dehydration   ARF (acute renal failure) (HCC)   Adjustment disorder with mixed anxiety and depressed mood   Poorly controlled diabetes mellitus (HCC)   Pressure injury of skin   History of external beam radiation therapy to pelvis  SURGERY 04/01/2017     POST-OPERATIVE DIAGNOSIS:   Recurrent endometrial cancer status post low anterior resection and partial pelvic exeneration  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   SURGEON:Haizley Cannella Gwynneth Aliment, MD- Primary (Dr Tresa Moore assist) PROCEDURE:  XI ROBOTIC LYSIS OF ADHESIONS x 5 hours XI ROBOTIC COLOSTOMY TAKEDOWN ROBOTIC SEWN COLOANAL ANASTOMOSIS Ileocecal resection with anastomosis Diverting loop ileostomy  SURGEON: Alexis Frock, MD - Primary (Dr Johney Maine assist) PROCEDURE:  Cystoscopy with R retrograde and stent exchange.  Robotic R ureteral reimplant with psoas bladder hitch.  Consults: urology   SURGERY 08/17/2017  DIAGNOSIS:  Right ureteral stricture, status post complex reconstruction.  PROCEDURES: 1. Cystoscopy with right retrograde pyelogram. 2. Removal of right ureteral stent.  PHYSICIAN:  Alexis Frock, MD        SPECIMENS:  Right ureteral stone for discard, inspected and intact.  FINDINGS: 1. Fusiform bladder shape consistent with prior psoas hitch. 2. Visibly patent ureteral-bladder anastomosis. 3. No evidence of hydronephrosis or narrowing with right retrograde     pyelogram.    Assessment  FAIR  Delayed bladder leak turned into colovesical fistula with preferential urinary drainage in decompressed diverted colon limb.  Plan:   Discussed with urology.  Dr. Leafy Ro.  Discussed with nephrology, Dr. Lorrene Reid.  Both are inclined to offer percutaneous nephrostomy tubes to divert  urine directly out of kidneys and out of the bladder that is draining into her neorectum/colon.  Perhaps  he can close down that way.  As mentioned before, I am highly skeptical that given her radiated bladder that had been aggressively treated with internal/external drainage and stenting and still developed a problem.  Most likely this is a leak on the left lateral inferiolateral corner of the cystostomy Heineke-Mikulicz and psoas hitch needed for the bladder to reach the right ureter at that had the chronic severe distal stricture.  I am highly skeptical that a radiated bladder with a delayed leak will spontaneously heal.  She had internal Foley drainage, external surgical drainage, internal stenting drainage for a few months, yet this happened.   Therefore, most likely will require surgical repair.  Standard of care usually on this situation is to resect colon & do a new distal anastomosis. Primarily repair the bladder & patch it.  However, there is no way I can redo anastomosis in a radiated pelvis with prior surgeries with prior anterior exoneration, radiation, 7-hour lysis adhesions and prior surgery when she was in better shape.  Only options I can see is to try & primarily patch the colon with OMENTOPEXY, primarily repair the bladder with OMENTOPEXY, and hope it will heal.  I am skeptical that it will.  Most likely we will have to convert to an end colostomy to get the colon away from it and allow the bladder to be patched.  I do agree with Dr. Tresa Moore that she is rather malnourished and deconditioned at this point so would like to try and hold off on definitive repair if we can.  I do not think a permanent loop ileostomy is going to be a good solution for her as she cannot tolerate it well.  We will see.  She actually talked about converting to a colostomy.  She would really like it to be infraumbilical so she can hide it in her pants.  I worry about increased parastomal hernia or risk with doing that.  We  will discuss that later once she is in better shape to actually tolerate a reoperation should it come to that (very likely).  Zoloft for depression.  Solid diet with supp shakes.  Thankfully, her nausea vomiting and ileus seem to be resolving.  -Ostomy nurse consultation for ileostomy bag pouching.  See if they can better control this and her chronic nausea. Some question of a decubitus on her gluteal region.  Looks like a mild scratch to me last week.  See what they think.    -Continue to monitor lytes  -Hemoglobin low but stable in pancytopenic pt  -Continue neupogen injections for pancytopenia  -Continue Rheumatex PO  -VTE prophylaxis: SCDs, etc  -Ambulate as tolerated to help recovery  Patient and husband still hoping that she could be my patient.  I noted I keep fighting with her despite her complicated medical and surgical history giving her more problems.  She is still quite emotionally devastated and exhausted.  Has been more optimistic.  They expressed understanding and appreciation.  Hopefully we can get her to a better situation.    Home health nursing seems to have failed.  Lack of outpatient ostomy clinic in town here making outpatient management challenging.   I strongly suspect she will need a SNF:     40 minutes spent in review, evaluation, examination, counseling, and coordination of care.  More than 50% of that time was spent in counseling.  [name], M.D., F.A.C.S. Gastrointestinal and Minimally Invasive Surgery [facility] [address], [phone]  [place], [place] [address] [phone] Main / Paging    09/01/2017    Subjective: (Chief complaint)  Tired. Trying solid diet.  Nausea much less.  Ileostomy output actually more thick. Foley catheter placed.  Making urine.  Still complaining of rectal leakage when she walks around despite the Foley catheter.  Having to wear diapers.  Embarrassing and frustrating.  This  seems to be most exasperating and debilitating thing for her right now.  CT cystogram revealing delayed bladder leak left contralateral and not at the right ureteral re-implantation.    Objective:  Vital signs:  Vitals:   08/31/17 2250 08/31/17 2320 09/01/17 0115 09/01/17 0544  BP: (!) 102/43 (!) 103/41 (!) 110/46 (!) 104/58  Pulse: 91 91 89 89  Resp: 18 18 18 16   Temp: 99 F (37.2 C) 99.3 F (37.4 C) 99.1 F (37.3 C) 98.9 F (37.2 C)  TempSrc: Axillary Axillary Axillary Oral  SpO2: 100% 100% 100% 100%  Weight:      Height:        Last BM Date: 08/31/17(ileostomy)  Intake/Output   Yesterday:  01/30 0701 - 01/31 0700 In: 3163 [P.O.:1045; I.V.:1700; Blood:318; IV Piggyback:100] Out: 2481 [Urine:2550; Stool:375] This shift:  No intake/output data recorded.  Bowel function:  Flatus: YES   BM:  YES.  Thick oatmeal consistency succus in ileostomy bag.  Drain: No drains.     Physical Exam:  General: Pt awake/alert/oriented x4 in no acute distress Eyes: PERRL, normal EOM.  Sclera clear.  No icterus Neuro: CN II-XII intact w/o focal sensory/motor deficits. Lymph: No head/neck/groin lymphadenopathy Psych:  No delerium/psychosis/paranoia.  Tearful.  Occasionally moaning/crying.  More consolable than last week though.   HENT: Normocephalic, Mucus membranes moist.  No thrush Neck: Supple, No tracheal deviation Chest: No chest wall pain w good excursion CV:  Pulses intact.  Regular rhythm MS: Normal AROM mjr joints.  No obvious deformity  Abdomen: Soft.  Nondistended.  No tenderness to palpation.  No evidence of peritonitis.  No incarcerated hernias.  No wounds.  Ileostomy pink with gas and succus in bag.      GU.  No purulent drainage or hematoma.  Foley in place with clear light yellow urine  Rectal deferred.  Last week had stricturing at coloanal anastomosis.  Had been finger dilated.  Held off on that today. Ext:   No deformity.  No mjr edema.  No cyanosis Skin: No  petechiae / purpura  Results:   Labs: Results for orders placed or performed during the hospital encounter of 08/29/17 (from the past 48 hour(s))  Glucose, capillary     Status: Abnormal   Collection Time: 08/30/17  7:58 AM  Result Value Ref Range   Glucose-Capillary 158 (H) 65 - 99 mg/dL  Osmolality     Status: Abnormal   Collection Time: 08/30/17  9:00 AM  Result Value Ref Range   Osmolality 299 (H) 275 - 295 mOsm/kg    Comment: Performed at [facility], [place] [address], [place], [place] [address]  Osmolality, urine     Status: Abnormal   Collection Time: 08/30/17 10:10 AM  Result Value Ref Range   Osmolality, Ur 253 (L) 300 - 900 mOsm/kg    Comment: Performed at [facility] [address], [place], [place] [address]  Culture, Urine     Status: Abnormal (Preliminary result)   Collection Time: 08/30/17 10:10 AM  Result Value Ref Range   Specimen Description URINE, RANDOM    Special  Requests NONE    Culture (A)     >=100,000 COLONIES/mL SERRATIA MARCESCENS 80,000 COLONIES/mL GROUP B STREP(S.AGALACTIAE)ISOLATED TESTING AGAINST S. AGALACTIAE NOT ROUTINELY PERFORMED DUE TO PREDICTABILITY OF AMP/PEN/VAN SUSCEPTIBILITY. Performed at [facility], [place] [address] [place], [place] [address]    Report Status PENDING   Glucose, capillary     Status: Abnormal   Collection Time: 08/30/17 12:28 PM  Result Value Ref Range   Glucose-Capillary 241 (H) 65 - 99 mg/dL  Sodium, urine, random     Status: None   Collection Time: 08/30/17 12:40 PM  Result Value Ref Range   Sodium, Ur 34 mmol/L    Comment: Performed at [facility] [address], [place], [place] [address]  Urinalysis, Routine w reflex microscopic     Status: Abnormal   Collection Time: 08/30/17 12:40 PM  Result Value Ref Range   Color, Urine YELLOW YELLOW   APPearance CLOUDY (A) CLEAR   Specific Gravity, Urine 1.006 1.005 - 1.030   pH 5.0 5.0 - 8.0   Glucose, UA NEGATIVE NEGATIVE mg/dL   Hgb  urine dipstick MODERATE (A) NEGATIVE   Bilirubin Urine NEGATIVE NEGATIVE   Ketones, ur NEGATIVE NEGATIVE mg/dL   Protein, ur 100 (A) NEGATIVE mg/dL   Nitrite NEGATIVE NEGATIVE   Leukocytes, UA LARGE (A) NEGATIVE   RBC / HPF 0-5 0 - 5 RBC/hpf   WBC, UA TOO NUMEROUS TO COUNT 0 - 5 WBC/hpf   Bacteria, UA MANY (A) NONE SEEN   Squamous Epithelial / LPF 0-5 (A) NONE SEEN   WBC Clumps PRESENT    Mucus PRESENT   Creatinine, urine, random     Status: None   Collection Time: 08/30/17 12:40 PM  Result Value Ref Range   Creatinine, Urine 43.98 mg/dL    Comment: Performed at [facility], [place] [address] [place], [place] [address]  Culture, blood (Routine X 2) w Reflex to ID Panel     Status: None (Preliminary result)   Collection Time: 08/30/17  3:26 PM  Result Value Ref Range   Specimen Description BLOOD BLOOD LEFT HAND    Special Requests IN PEDIATRIC BOTTLE Blood Culture adequate volume    Culture      NO GROWTH < 24 HOURS Performed at [name] [address] [place], [place] [address]    Report Status PENDING   Culture, blood (Routine X 2) w Reflex to ID Panel     Status: None (Preliminary result)   Collection Time: 08/30/17  3:30 PM  Result Value Ref Range   Specimen Description BLOOD LEFT ANTECUBITAL    Special Requests IN PEDIATRIC BOTTLE Blood Culture adequate volume    Culture      NO GROWTH < 24 HOURS Performed at [facility] [address], [place],  [address]    Report Status PENDING   Glucose, capillary     Status: Abnormal   Collection Time: 08/30/17  4:38 PM  Result Value Ref Range   Glucose-Capillary 218 (H) 65 - 99 mg/dL  Glucose, capillary     Status: Abnormal   Collection Time: 08/30/17  9:17 PM  Result Value Ref Range   Glucose-Capillary 181 (H) 65 - 99 mg/dL  Magnesium     Status: Abnormal   Collection Time: 08/31/17  4:51 AM  Result Value Ref Range   Magnesium 1.3 (L) 1.7 - 2.4 mg/dL  Renal function panel     Status: Abnormal  Collection Time: 08/31/17  4:51 AM  Result Value Ref Range   Sodium 128 (L) 135 - 145 mmol/L   Potassium 3.6 3.5 - 5.1 mmol/L   Chloride 105 101 - 111 mmol/L   CO2 15 (L) 22 - 32 mmol/L   Glucose, Bld 221 (H) 65 - 99 mg/dL   BUN 69 (H) 6 - 20 mg/dL   Creatinine, Ser 3.60 (H) 0.44 - 1.00 mg/dL   Calcium 8.1 (L) 8.9 - 10.3 mg/dL   Phosphorus 2.0 (L) 2.5 - 4.6 mg/dL   Albumin 1.7 (L) 3.5 - 5.0 g/dL   GFR calc non Af Amer 12 (L) >60 mL/min   GFR calc Af Amer 14 (L) >60 mL/min    Comment: (NOTE) The eGFR has been calculated using the CKD EPI equation. This calculation has not been validated in all clinical situations. eGFR's persistently <60 mL/min signify possible Chronic Kidney Disease.    Anion gap 8 5 - 15  CBC     Status: Abnormal   Collection Time: 08/31/17  4:51 AM  Result Value Ref Range   WBC 1.7 (L) 4.0 - 10.5 K/uL   RBC 2.69 (L) 3.87 - 5.11 MIL/uL   Hemoglobin 7.9 (L) 12.0 - 15.0 g/dL   HCT 22.5 (L) 36.0 - 46.0 %   MCV 83.6 78.0 - 100.0 fL   MCH 29.4 26.0 - 34.0 pg   MCHC 35.1 30.0 - 36.0 g/dL   RDW 15.0 11.5 - 15.5 %   Platelets 204 150 - 400 K/uL  Iron and TIBC     Status: Abnormal   Collection Time: 08/31/17  4:51 AM  Result Value Ref Range   Iron 7 (L) 28 - 170 ug/dL   TIBC 164 (L) 250 - 450 ug/dL   Saturation Ratios 4 (L) 10.4 - 31.8 %   UIBC 157 ug/dL    Comment: Performed at [facility], [address], [place], [place] [address]  Glucose, capillary     Status: Abnormal   Collection Time: 08/31/17  7:08 AM  Result Value Ref Range   Glucose-Capillary 187 (H) 65 - 99 mg/dL  Glucose, capillary     Status: Abnormal   Collection Time: 08/31/17 11:53 AM  Result Value Ref Range   Glucose-Capillary 328 (H) 65 - 99 mg/dL  Glucose, capillary     Status: Abnormal   Collection Time: 08/31/17  4:45 PM  Result Value Ref Range   Glucose-Capillary 241 (H) 65 - 99 mg/dL  Prepare RBC     Status: None   Collection Time: 08/31/17  6:11 PM  Result Value Ref  Range   Order Confirmation ORDER PROCESSED BY BLOOD BANK   Type and screen [facility]     Status: None (Preliminary result)   Collection Time: 08/31/17  6:11 PM  Result Value Ref Range   ABO/RH(D) A POS    Antibody Screen NEG    Sample Expiration 09/03/2017    Unit Number [id number]    Blood Component Type RED CELLS,LR    Unit division 00    Status of Unit ISSUED    Transfusion Status OK TO TRANSFUSE    Crossmatch Result Compatible   Glucose, capillary     Status: Abnormal   Collection Time: 08/31/17  9:06 PM  Result Value Ref Range   Glucose-Capillary 308 (H) 65 - 99 mg/dL  Renal function panel     Status: Abnormal   Collection Time: 09/01/17  4:06 AM  Result Value  Ref Range   Sodium 126 (L) 135 - 145 mmol/L   Potassium 3.0 (L) 3.5 - 5.1 mmol/L   Chloride 98 (L) 101 - 111 mmol/L   CO2 19 (L) 22 - 32 mmol/L   Glucose, Bld 306 (H) 65 - 99 mg/dL   BUN 61 (H) 6 - 20 mg/dL   Creatinine, Ser 3.46 (H) 0.44 - 1.00 mg/dL   Calcium 7.6 (L) 8.9 - 10.3 mg/dL   Phosphorus 2.5 2.5 - 4.6 mg/dL   Albumin 1.6 (L) 3.5 - 5.0 g/dL   GFR calc non Af Amer 13 (L) >60 mL/min   GFR calc Af Amer 15 (L) >60 mL/min    Comment: (NOTE) The eGFR has been calculated using the CKD EPI equation. This calculation has not been validated in all clinical situations. eGFR's persistently <60 mL/min signify possible Chronic Kidney Disease.    Anion gap 9 5 - 15  CBC with Differential/Platelet     Status: Abnormal   Collection Time: 09/01/17  4:06 AM  Result Value Ref Range   WBC 4.3 4.0 - 10.5 K/uL   RBC 2.87 (L) 3.87 - 5.11 MIL/uL   Hemoglobin 8.4 (L) 12.0 - 15.0 g/dL   HCT 24.5 (L) 36.0 - 46.0 %   MCV 85.4 78.0 - 100.0 fL   MCH 29.3 26.0 - 34.0 pg   MCHC 34.3 30.0 - 36.0 g/dL   RDW 14.8 11.5 - 15.5 %   Platelets 200 150 - 400 K/uL   Neutrophils Relative % 73 %   Lymphocytes Relative 6 %   Monocytes Relative 19 %   Eosinophils Relative 2 %   Basophils Relative 0 %   Neutro  Abs 3.1 1.7 - 7.7 K/uL   Lymphs Abs 0.3 (L) 0.7 - 4.0 K/uL   Monocytes Absolute 0.8 0.1 - 1.0 K/uL   Eosinophils Absolute 0.1 0.0 - 0.7 K/uL   Basophils Absolute 0.0 0.0 - 0.1 K/uL   WBC Morphology TOXIC GRANULATION   Magnesium     Status: None   Collection Time: 09/01/17  4:06 AM  Result Value Ref Range   Magnesium 2.0 1.7 - 2.4 mg/dL    Imaging / Studies: Ct Abdomen Pelvis Wo Contrast  Result Date: 08/31/2017 CLINICAL DATA:  62 year old female status post bladder reconstruction in August 2018 for urinary leak. History of ovarian and uterine cancer in 2006 with recurrence in 2016 treated with chemotherapy and radiation therapy. EXAM: CT ABDOMEN AND PELVIS WITHOUT CONTRAST TECHNIQUE: Multidetector CT imaging of the abdomen and pelvis was performed following the standard protocol without IV contrast. COMPARISON:  CT the abdomen and pelvis 08/25/2017. FINDINGS: Lower chest: Bilateral breast implants incidentally noted. Otherwise, unremarkable. Hepatobiliary: Mild diffuse low attenuation throughout the hepatic parenchyma indicative of a background of mild hepatic steatosis. Calcified granuloma in the right lobe of the liver. No definite suspicious cystic or solid hepatic lesions are noted on today's noncontrast CT examination. Status post cholecystectomy. Pancreas: No pancreatic mass or peripancreatic inflammatory changes are noted on today's noncontrast CT examination. Spleen: Unremarkable. Adrenals/Urinary Tract: Moderate atrophy of the right kidney. Mild bilateral hydroureteronephrosis. Reflux of iodinated contrast material into the right renal collecting system. Nonobstructive calculi in the lower pole collecting system of left kidney, similar to prior study from 08/25/2017, measuring up to 11 mm. Urinary bladder is partially decompressed around an indwelling Foley catheter. Rectovesical fistula best appreciated on axial image 76 of series 2, sagittal images 40-53 of series 5 and coronal images 55-60  of series 4,  with large amount of either native contrast material extending from the urinary bladder into the rectum. Bilateral adrenal glands are normal in appearance. Stomach/Bowel: Normal appearance of the stomach. No pathologic dilatation of small bowel or colon. Postoperative changes of partial colectomy and small bowel resections are noted, with right upper quadrant ostomy. Adjacent to the rectovesical fistula there is a short extension of contrast which extends posteriorly into the presacral space and extends slightly cephalad, presumably a fistulous tract (best demonstrated on axial image 71 of series 2, sagittal images 50-58 of series 5 and coronal images 57-64 of series 4. Vascular/Lymphatic: No atherosclerotic calcifications in the abdominal or pelvic vasculature. No lymphadenopathy noted in the abdomen or pelvis. Reproductive: Status post total abdominal hysterectomy and bilateral salpingo-oophorectomy. Other: Trace volume of ascites in the low anatomic pelvis. No pneumoperitoneum. Musculoskeletal: There are no aggressive appearing lytic or blastic lesions noted in the visualized portions of the skeleton. IMPRESSION: 1. Rectovesical fistula, as above. 2. 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 3. Reflux of contrast material into the right kidney via the right ureter with moderate atrophy of the right kidney. Nonobstructive calculi in the lower pole collecting system of the left kidney measuring up to 11 mm. 4. Additional incidental findings, as above. These results will be called to the ordering clinician or representative by the Radiologist Assistant, and communication documented in the PACS or zVision Dashboard. Electronically Signed   By: Vinnie Langton M.D.   On: 08/31/2017 14:34   Dg Chest 2 View  Result Date: 08/31/2017 CLINICAL DATA:  Shortness of breath. EXAM: CHEST  2  VIEW COMPARISON:  Radiographs of August 30, 2017. FINDINGS: The heart size and mediastinal contours are within normal limits. Both lungs are clear. No pneumothorax or pleural effusion is noted. Right-sided PICC line is unchanged in position. The visualized skeletal structures are unremarkable. IMPRESSION: No active cardiopulmonary disease. Electronically Signed   By: Marijo Conception, M.D.   On: 08/31/2017 10:06   US Renal  Result Date: 08/30/2017 CLINICAL DATA:  62 year old female with history of acute renal failure. History of ureteral stricture. EXAM: RENAL / URINARY TRACT ULTRASOUND COMPLETE COMPARISON:  No prior renal ultrasound. CT the abdomen and pelvis 08/25/2017. FINDINGS: Right Kidney: Length: 10 cm. Echogenicity within normal limits. No mass or hydronephrosis visualized. 7 mm echogenic focus in the interpolar region of the right kidney with some mild posterior acoustic shadowing, compatible with a calculus. Left Kidney: Length: 14.9 cm. Echogenicity within normal limits. Mild to moderate hydronephrosis. No mass visualized. Echogenic focus measuring 6 mm in the lower pole with some posterior acoustic shadowing, compatible with a calculus. Bladder: Under distended and poorly visualized. IMPRESSION: 1. Small nonobstructive calculi in the collecting systems of both kidneys. 2. Mild to moderate hydronephrosis. Electronically Signed   By: Vinnie Langton M.D.   On: 08/30/2017 09:42   Dg Abd Acute W/chest  Result Date: 08/30/2017 CLINICAL DATA:  Shortness of breath on exertion. EXAM: DG ABDOMEN ACUTE W/ 1V CHEST COMPARISON:  None. FINDINGS: There is no evidence of dilated bowel loops or free intraperitoneal air. Status post cholecystectomy. Surgical clips are seen in the pelvis. Heart size and mediastinal contours are within normal limits. Both lungs are clear. IMPRESSION: No evidence of bowel obstruction or ileus. No acute cardiopulmonary disease. Electronically Signed   By: Marijo Conception, M.D.   On:  08/30/2017 11:26    Medications /  Allergies: per chart  Antibiotics: Anti-infectives (From admission, onward)   Start     Dose/Rate Route Frequency Ordered Stop   08/30/17 1000  ciprofloxacin (CIPRO) IVPB 200 mg     200 mg 100 mL/hr over 60 Minutes Intravenous Every 24 hours 08/30/17 0855          Note: Portions of this report may have been transcribed using voice recognition software. Every effort was made to ensure accuracy; however, inadvertent computerized transcription errors may be present.   Any transcriptional errors that result from this process are unintentional.     [name], M.D., F.A.C.S. Gastrointestinal and Minimally Invasive Surgery [facility] [address] [place], [place] [address] [phone] Main / Paging    09/01/2017

## 2017-09-01 NOTE — Progress Notes (Signed)
[facility] Rounding Note  Subjective/Interval History:  Up in the chair Some shortness of breath Transfused 1 unit last PM Still leaking urine from rectum despite foley decompression of bladder Says Dr. Tresa Moore has mentioned perc tubs (which I think would be VERY reasonable way to decompress bladder, give kidneys best chance of "escaping" effects of any ongoing mechanical obstruction) Has blister on left index finger and wonders if is an ATB side effect (I don't know answer to that)  Objective Vital signs in last 24 hours: Vitals:   08/31/17 2250 08/31/17 2320 09/01/17 0115 09/01/17 0544  BP: (!) 102/43 (!) 103/41 (!) 110/46 (!) 104/58  Pulse: 91 91 89 89  Resp: 18 18 18 16   Temp: 99 F (37.2 C) 99.3 F (37.4 C) 99.1 F (37.3 C) 98.9 F (37.2 C)  TempSrc: Axillary Axillary Axillary Oral  SpO2: 100% 100% 100% 100%  Weight:      Height:       Weight change:   Intake/Output Summary (Last 24 hours) at 09/01/2017 0745 Last data filed at 09/01/2017 0600 Gross per 24 hour  Intake 3163 ml  Output 3175 ml  Net -12 ml   Physical Exam:  Blood pressure (!) 104/58, pulse 89, temperature 98.9 F (37.2 C), temperature source Oral, resp. rate 16, height 5\' 3"  (1.6 m), weight 82.9 kg (182 lb 12.2 oz), SpO2 100 %.  Physical Exam:  Blood pressure 110/64, pulse 88, temperature 98.4 F (36.9 C), temperature source Oral, resp. rate 17, height 5\' 3"  (1.6 m), weight 82.9 kg (182 lb 12.2 oz), SpO2 95 %.  Pale, anxious WF Husband in the room with her Complains of SOB Extremely pale Lungs with new basilar crackles Normal heart sounds S1S2 No S3 No murmur Abd with ileostomy bag just emptied.  Remains mildly tender in RLQ + BS Trace LE edema (new) Alert, oriented   Recent Labs  Lab 08/29/17 1550 08/30/17 0623 08/31/17 0451 09/01/17 0406  NA 129* 130* 128* 126*  K 3.2* 3.0* 3.6 3.0*  CL 106 107 105 98*  CO2 11* 10* 15* 19*  GLUCOSE 180* 182* 221* 306*  BUN 79* 81* 69* 61*  CREATININE 4.28*  4.24* 3.60* 3.46*  CALCIUM 9.0 8.8* 8.1* 7.6*  PHOS 4.6  --  2.0* 2.5   Recent Labs  Lab 08/29/17 1550 08/31/17 0451 09/01/17 0406  AST 16  --   --   ALT 24  --   --   ALKPHOS 173*  --   --   BILITOT 0.5  --   --   PROT 6.8  --   --   ALBUMIN 2.1* 1.7* 1.6*    Recent Labs  Lab 08/30/17 0623 08/31/17 0451 09/01/17 0406  WBC 2.8* 1.7* 4.3  NEUTROABS 1.8  --  3.1  HGB 9.4* 7.9* 8.4*  HCT 26.8* 22.5* 24.5*  MCV 84.3 83.6 85.4  PLT 222 204 200    Recent Labs  Lab 08/30/17 2117 08/31/17 0708 08/31/17 1153 08/31/17 1645 08/31/17 2106  GLUCAP 181* 187* 328* 241* 308*    Studies/Results: Ct Abdomen Pelvis Wo Contrast  Result Date: 08/31/2017 CLINICAL DATA:  62 year old female status post bladder reconstruction in August 2018 for urinary leak. History of ovarian and uterine cancer in 2006 with recurrence in 2016 treated with chemotherapy and radiation therapy. EXAM: CT ABDOMEN AND PELVIS WITHOUT CONTRAST TECHNIQUE: Multidetector CT imaging of the abdomen and pelvis was performed following the standard protocol without IV contrast. COMPARISON:  CT the abdomen and pelvis 08/25/2017.  FINDINGS: Lower chest: Bilateral breast implants incidentally noted. Otherwise, unremarkable. Hepatobiliary: Mild diffuse low attenuation throughout the hepatic parenchyma indicative of a background of mild hepatic steatosis. Calcified granuloma in the right lobe of the liver. No definite suspicious cystic or solid hepatic lesions are noted on today's noncontrast CT examination. Status post cholecystectomy. Pancreas: No pancreatic mass or peripancreatic inflammatory changes are noted on today's noncontrast CT examination. Spleen: Unremarkable. Adrenals/Urinary Tract: Moderate atrophy of the right kidney. Mild bilateral hydroureteronephrosis. Reflux of iodinated contrast material into the right renal collecting system. Nonobstructive calculi in the lower pole collecting system of left kidney, similar to  prior study from 08/25/2017, measuring up to 11 mm. Urinary bladder is partially decompressed around an indwelling Foley catheter. 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 Bilateral adrenal glands are normal in appearance. Stomach/Bowel: Normal appearance of the stomach. No pathologic dilatation of small bowel or colon. Postoperative changes of partial colectomy and small bowel resections are noted, with right upper quadrant ostomy. Adjacent to the rectovesical fistula there is a short extension of contrast which extends posteriorly into the presacral space and extends slightly cephalad, presumably a fistulous tract (best demonstrated on axial image 71 of series 2, sagittal images 50-58 of series 5 and coronal images 57-64 of series 4. Vascular/Lymphatic: No atherosclerotic calcifications in the abdominal or pelvic vasculature. No lymphadenopathy noted in the abdomen or pelvis. Reproductive: Status post total abdominal hysterectomy and bilateral salpingo-oophorectomy. Other: Trace volume of ascites in the low anatomic pelvis. No pneumoperitoneum. Musculoskeletal: There are no aggressive appearing lytic or blastic lesions noted in the visualized portions of the skeleton. IMPRESSION: 1. Rectovesical fistula, as above. 2. 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 3. Reflux of contrast material into the right kidney via the right ureter with moderate atrophy of the right kidney. Nonobstructive calculi in the lower pole collecting system of the left kidney measuring up to 11 mm. 4. Additional incidental findings, as above. These results will be called to the ordering clinician or representative by the  Radiologist Assistant, and communication documented in the PACS or zVision Dashboard. Electronically Signed   By: Vinnie Langton M.D.   On: 08/31/2017 14:34   Dg Chest 2 View  Result Date: 08/31/2017 CLINICAL DATA:  Shortness of breath. EXAM: CHEST  2 VIEW COMPARISON:  Radiographs of August 30, 2017. FINDINGS: The heart size and mediastinal contours are within normal limits. Both lungs are clear. No pneumothorax or pleural effusion is noted. Right-sided PICC line is unchanged in position. The visualized skeletal structures are unremarkable. IMPRESSION: No active cardiopulmonary disease. Electronically Signed   By: Marijo Conception, M.D.   On: 08/31/2017 10:06   US Renal  Result Date: 08/30/2017 CLINICAL DATA:  62 year old female with history of acute renal failure. History of ureteral stricture. EXAM: RENAL / URINARY TRACT ULTRASOUND COMPLETE COMPARISON:  No prior renal ultrasound. CT the abdomen and pelvis 08/25/2017. FINDINGS: Right Kidney: Length: 10 cm. Echogenicity within normal limits. No mass or hydronephrosis visualized. 7 mm echogenic focus in the interpolar region of the right kidney with some mild posterior acoustic shadowing, compatible with a calculus. Left Kidney: Length: 14.9 cm. Echogenicity within normal limits. Mild to moderate hydronephrosis. No mass visualized. Echogenic  focus measuring 6 mm in the lower pole with some posterior acoustic shadowing, compatible with a calculus. Bladder: Under distended and poorly visualized. IMPRESSION: 1. Small nonobstructive calculi in the collecting systems of both kidneys. 2. Mild to moderate hydronephrosis. Electronically Signed   By: Vinnie Langton M.D.   On: 08/30/2017 09:42   Dg Abd Acute W/chest  Result Date: 08/30/2017 CLINICAL DATA:  Shortness of breath on exertion. EXAM: DG ABDOMEN ACUTE W/ 1V CHEST COMPARISON:  None. FINDINGS: There is no evidence of dilated bowel loops or free intraperitoneal air. Status post cholecystectomy. Surgical  clips are seen in the pelvis. Heart size and mediastinal contours are within normal limits. Both lungs are clear. IMPRESSION: No evidence of bowel obstruction or ileus. No acute cardiopulmonary disease. Electronically Signed   By: Marijo Conception, M.D.   On: 08/30/2017 11:26   Medications: . sodium chloride    . chlorproMAZINE (THORAZINE) IV    . ciprofloxacin Stopped (08/31/17 1117)  . methocarbamol (ROBAXIN)  IV    .  sodium bicarbonate  infusion 1000 mL 100 mL/hr at 08/31/17 1012   . anastrozole  1 mg Oral Daily  . [START ON 09/04/2017] cholecalciferol  10,000 Units Oral Weekly  . diphenoxylate-atropine  1-2 tablet Oral Q8H  . enoxaparin (LOVENOX) injection  30 mg Subcutaneous Q24H  . famotidine  20 mg Oral Daily  . feeding supplement  237 mL Oral BID BM  . insulin aspart  0-15 Units Subcutaneous TID WC  . insulin aspart  0-5 Units Subcutaneous QHS  . levothyroxine  150 mcg Oral QAC breakfast  . lip balm  1 application Topical BID  . loratadine  10 mg Oral q morning - 10a  . metoCLOPramide  5 mg Oral TID AC  . polyvinyl alcohol  1 drop Both Eyes QHS  . prenatal multivitamin  1 tablet Oral Daily  . psyllium  1 packet Oral Daily  . rosuvastatin  10 mg Oral QPM  . [START ON 09/05/2017] Tbo-Filgrastim  480 mcg Subcutaneous Weekly   Assessment/Recommendations 1. Acute kidney injury - Volume depletion was top of the list on admission 2/2 inadequate intake in face of high output ileostomy. Some degree of hydro chronically. 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 Whatever proximate cause of renal failure is, has been present since at least 08/17/17 with SCr 3 when ureteral stent removed (baseline 1). Could certainly have had ATN from prolonged volume depletion and can't exclude effects of mechanical obstruction long term (chronic but "stable" hydro per Urology) 1. Volume now replete, probably some overload. Will reduce IVF's. (May need to stop them and give lasix next day or  so if SOB not better) 2. Continue with the isotonic bicarb at lower rate (says not sure she cold take oral bicarb tabs).  3. Per patient Dr. Tresa Moore has suggested possibility of bilateral perc tubes which I think would be VERY reasonable way to decompress bladder w/RV fistula (still leaking) , give kidneys best chance of "escaping" effects of any ongoing mechanical obstruction) 2.  Metabolic acidosis  - on metformin with AKI accounting for acidosis with large GI losses.. 3.  Hypokalemia - repeat 3 runs today 4.  Hypomagnesemia - Mag given yesterday, 2.0 today 5.  Anemia - Hb down to 7.9 with hydration.Transfused 1 U 1/31. Fe stores VERY low but no IV Fe while on IV ATB's 6.  Polymicrobial UTI - serratia and Group B strep. Cipro. BC's neg. 7.  Chronic neutropenia - last dosed  herself with filagrastin on PM prior to admission (so 1/27). WBC up some today.   [name], MD [facility] [phone] pager 09/01/2017, 7:45 AM

## 2017-09-01 NOTE — Progress Notes (Signed)
[place] Spiritual Care Note  Bradlee and I know each other from working at [facility] together. Made pastoral visit inpt to offer spiritual and emotional support. Addressed logistical needs, provided empathic listening, normalized feelings, and provided social support. [name] reports good support from family, friends, and church, which together are the core of her meaning-making in the midst of emotional and physical distress. She eagerly awaits receiving communion from her ministers [name]) tomorrow; she became tearful as she described her disappointment at having missed at least two communion services recently. One of her ministers, [name], is also a chaplain resident at [facility], which provides an additional layer of comfort and connection. Family is aware of ongoing chaplain availability, but please also page if circumstances change or immediate needs arise. Thank you.   Grace, [place], [facility] M-F daytime pager [phone] [facility] 24/7 pager [phone] Voicemail [phone]

## 2017-09-01 NOTE — Progress Notes (Signed)
Inpatient Diabetes Program Recommendations  AACE/ADA: New Consensus Statement on Inpatient Glycemic Control (2015)  Target Ranges:  Prepandial:   less than 140 mg/dL      Peak postprandial:   less than 180 mg/dL (1-2 hours)      Critically ill patients:  140 - 180 mg/dL   Lab Results  Component Value Date   GLUCAP 352 (H) 09/01/2017   HGBA1C 7.9 (H) 09/01/2017    Review of Glycemic Control  Diabetes history: DM2 Outpatient Diabetes medications: metformin 1000 mg bid, Januvia 100 mg QAM Current orders for Inpatient glycemic control: Lantus 10 units QHS, Novolog 0-20 units tidwc and hs  Ate 100% this am. Post-prandial blood sugars elevated. To start Lantus 10 units QHS tonight.  Inpatient Diabetes Program Recommendations:     May benefit from addition of meal coverage insulin - Novolog 3 units tidwc if pt eats > 50% meal.   Continue to follow.  Thank you. Lorenda Peck, RD, LDN, CDE Inpatient Diabetes Coordinator [phone]

## 2017-09-01 NOTE — Progress Notes (Signed)
Initial Nutrition Assessment  DOCUMENTATION CODES:   Obesity unspecified  INTERVENTION:   -Provide Premier Protein BID, each supplement provides 160 kcal and 30 grams of protein.  -Encourage PO intakes -Will provide "Ileostomy Nutrition Therapy" handout at follow-up  NUTRITION DIAGNOSIS:   Increased nutrient needs related to post-op healing as evidenced by estimated needs.  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   Patient in room with husband at bedside. Pt reports she is eating much better now. Very happy she is on a solid food diet now. Nausea is bring controlled by medications. Pt has consumed 50-100% of meals since 1/29. States she tolerated an omelet, some fried apples, potatoes, and cream of wheat this morning. PTA pt was not able to tolerate foods for 2-3 weeks.  Pt had Ensure Surgery ordered, pt refusing to drink. States she prefers Engineer, civil (consulting), will order BID.  Pt interested in reviewing ileostomy diet as well. Will provide handout at follow-up and review with patient and husband.  Per chart review, pt has lost 30 lb since 9/15 (14% wt loss x 4 months, significant for time frame). Suspect malnutrition but unable to diagnosis d/t recent improvement in intakes.   Medications: Reglan tablet TID, Prenatal MVI tablet daily, Metamucil packet daily, IV Compazine PRN Labs reviewed:   CBGs: 250-352 Low Na, K Phos/ Mg WNL GFR: 13  NUTRITION - FOCUSED PHYSICAL EXAM:    Most Recent Value  Orbital Region  No depletion  Upper Arm Region  No depletion  Thoracic and Lumbar Region  Unable to  assess  Buccal Region  No depletion  Temple Region  Mild depletion  Clavicle Bone Region  No depletion  Clavicle and Acromion Bone Region  No depletion  Scapular Bone Region  No depletion  Dorsal Hand  No depletion  Patellar Region  Unable to assess  Anterior Thigh Region  Unable to assess  Posterior Calf Region  Unable to assess  Edema (RD Assessment)  None       Diet Order:  Diet Heart Room service appropriate? Yes; Fluid consistency: Thin  EDUCATION NEEDS:   Education needs have been addressed(Will provide handout at follow-up)  Skin:  Skin Assessment: Skin Integrity Issues: Skin Integrity Issues:: Stage I Stage I: buttocks  Last BM:  1/31  Height:   Ht Readings from Last 1 Encounters:  08/30/17 5\' 3"  (1.6 m)    Weight:   Wt Readings from Last 1 Encounters:  08/30/17 182 lb 12.2 oz (82.9 kg)    Ideal Body Weight:  52.3 kg  BMI:  Body mass index is 32.37 kg/m.  Estimated Nutritional Needs:   Kcal:  1650-1850  Protein:  65-75g  Fluid:  1.8L/day  Clayton Bibles, MS, RD, LDN [place] Dietitian Pager: [phone] After Hours Pager: [phone]

## 2017-09-02 ENCOUNTER — Encounter (HOSPITAL_COMMUNITY): Payer: Self-pay | Admitting: Interventional Radiology

## 2017-09-02 ENCOUNTER — Inpatient Hospital Stay (HOSPITAL_COMMUNITY): Payer: BLUE CROSS/BLUE SHIELD

## 2017-09-02 HISTORY — PX: IR NEPHROSTOGRAM LEFT INITIAL PLACEMENT: IMG6057

## 2017-09-02 HISTORY — PX: IR NEPHROSTOGRAM RIGHT INITIAL PLACEMENT: IMG6058

## 2017-09-02 LAB — RENAL FUNCTION PANEL
ANION GAP: 9 (ref 5–15)
Albumin: 1.4 g/dL — ABNORMAL LOW (ref 3.5–5.0)
BUN: 59 mg/dL — AB (ref 6–20)
CHLORIDE: 94 mmol/L — AB (ref 101–111)
CO2: 22 mmol/L (ref 22–32)
Calcium: 7.6 mg/dL — ABNORMAL LOW (ref 8.9–10.3)
Creatinine, Ser: 3.61 mg/dL — ABNORMAL HIGH (ref 0.44–1.00)
GFR calc Af Amer: 14 mL/min — ABNORMAL LOW (ref >60)
GFR, EST NON AFRICAN AMERICAN: 12 mL/min — AB (ref >60)
GLUCOSE: 357 mg/dL — AB (ref 65–99)
POTASSIUM: 3.3 mmol/L — AB (ref 3.5–5.1)
Phosphorus: 2.5 mg/dL (ref 2.5–4.6)
Sodium: 125 mmol/L — ABNORMAL LOW (ref 135–145)

## 2017-09-02 LAB — MAGNESIUM: Magnesium: 1.5 mg/dL — ABNORMAL LOW (ref 1.7–2.4)

## 2017-09-02 LAB — CBC
HEMATOCRIT: 23.3 % — AB (ref 36.0–46.0)
Hemoglobin: 8.1 g/dL — ABNORMAL LOW (ref 12.0–15.0)
MCH: 29.9 pg (ref 26.0–34.0)
MCHC: 34.8 g/dL (ref 30.0–36.0)
MCV: 86 fL (ref 78.0–100.0)
PLATELETS: 184 10*3/uL (ref 150–400)
RBC: 2.71 MIL/uL — ABNORMAL LOW (ref 3.87–5.11)
RDW: 15.1 % (ref 11.5–15.5)
WBC: 5.7 10*3/uL (ref 4.0–10.5)

## 2017-09-02 LAB — GLUCOSE, CAPILLARY
GLUCOSE-CAPILLARY: 319 mg/dL — AB (ref 65–99)
GLUCOSE-CAPILLARY: 194 mg/dL — AB (ref 65–99)
GLUCOSE-CAPILLARY: 155 mg/dL — AB (ref 65–99)
GLUCOSE-CAPILLARY: 122 mg/dL — AB (ref 65–99)
Glucose-Capillary: 189 mg/dL — ABNORMAL HIGH (ref 65–99)
Glucose-Capillary: 230 mg/dL — ABNORMAL HIGH (ref 65–99)

## 2017-09-02 LAB — PROTIME-INR
INR: 1.21
Prothrombin Time: 15.2 seconds (ref 11.4–15.2)

## 2017-09-02 MED ORDER — MAGNESIUM SULFATE 2 GM/50ML IV SOLN
2.0000 g | Freq: Once | INTRAVENOUS | Status: DC
  Filled 2017-09-02: qty 50

## 2017-09-02 MED ORDER — MIDAZOLAM HCL 2 MG/2ML IJ SOLN
INTRAMUSCULAR | Status: AC
  Filled 2017-09-02: qty 4

## 2017-09-02 MED ORDER — IOPAMIDOL (ISOVUE-300) INJECTION 61%
INTRAVENOUS | Status: AC
  Administered 2017-09-02: 30 mL
  Filled 2017-09-02: qty 50

## 2017-09-02 MED ORDER — FENTANYL CITRATE (PF) 100 MCG/2ML IJ SOLN
INTRAMUSCULAR | Status: AC
  Filled 2017-09-02: qty 4

## 2017-09-02 MED ORDER — POTASSIUM CHLORIDE 10 MEQ/100ML IV SOLN
10.0000 meq | INTRAVENOUS | Status: AC
  Administered 2017-09-02 (×4): 10 meq via INTRAVENOUS
  Filled 2017-09-02 (×4): qty 100

## 2017-09-02 MED ORDER — FENTANYL CITRATE (PF) 100 MCG/2ML IJ SOLN
INTRAMUSCULAR | Status: AC | PRN
  Administered 2017-09-02 (×2): 50 ug via INTRAVENOUS

## 2017-09-02 MED ORDER — LIDOCAINE HCL 1 % IJ SOLN
INTRAMUSCULAR | Status: AC
  Filled 2017-09-02: qty 20

## 2017-09-02 MED ORDER — FUROSEMIDE 10 MG/ML IJ SOLN: 80.0000 mg | Freq: Once | INTRAMUSCULAR | Status: DC

## 2017-09-02 MED ORDER — ACETAMINOPHEN 650 MG RE SUPP
650.0000 mg | Freq: Four times a day (QID) | RECTAL | Status: DC | PRN
Start: 2017-09-02 — End: 2017-09-05
  Administered 2017-09-02: 650 mg via RECTAL
  Filled 2017-09-02: qty 1

## 2017-09-02 MED ORDER — ALBUMIN HUMAN 25 % IV SOLN
25.0000 g | Freq: Once | INTRAVENOUS | Status: AC
  Administered 2017-09-02: 25 g via INTRAVENOUS
  Filled 2017-09-02: qty 100

## 2017-09-02 MED ORDER — LIDOCAINE HCL 1 % IJ SOLN
INTRAMUSCULAR | Status: AC | PRN
  Administered 2017-09-02: 10 mL

## 2017-09-02 MED ORDER — IOPAMIDOL (ISOVUE-370) INJECTION 76%
50.0000 mL | Freq: Once | INTRAVENOUS | Status: DC | PRN
Start: 2017-09-02 — End: 2017-09-03

## 2017-09-02 MED ORDER — INSULIN GLARGINE 100 UNIT/ML ~~LOC~~ SOLN
20.0000 [IU] | Freq: Two times a day (BID) | SUBCUTANEOUS | Status: DC
  Administered 2017-09-02 – 2017-09-04 (×6): 20 [IU] via SUBCUTANEOUS
  Filled 2017-09-02 (×8): qty 0.2

## 2017-09-02 MED ORDER — HYDROMORPHONE HCL 1 MG/ML IJ SOLN
0.5000 mg | INTRAMUSCULAR | Status: DC | PRN
  Administered 2017-09-02 – 2017-09-03 (×2): 0.5 mg via INTRAVENOUS
  Administered 2017-09-03 (×4): 1 mg via INTRAVENOUS
  Administered 2017-09-03: 0.5 mg via INTRAVENOUS
  Administered 2017-09-04 – 2017-09-05 (×10): 1 mg via INTRAVENOUS
  Administered 2017-09-05: 0.5 mg via INTRAVENOUS
  Administered 2017-09-06 – 2017-09-08 (×10): 1 mg via INTRAVENOUS
  Filled 2017-09-02 (×28): qty 1

## 2017-09-02 MED ORDER — MIDAZOLAM HCL 2 MG/2ML IJ SOLN
INTRAMUSCULAR | Status: AC | PRN
  Administered 2017-09-02 (×2): 1 mg via INTRAVENOUS

## 2017-09-02 MED ORDER — ACETAMINOPHEN 500 MG PO TABS
1000.0000 mg | ORAL_TABLET | Freq: Three times a day (TID) | ORAL | Status: DC
  Administered 2017-09-02 – 2017-09-03 (×2): 1000 mg via ORAL
  Filled 2017-09-02 (×2): qty 2

## 2017-09-02 NOTE — Progress Notes (Signed)
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 She underwent a prolonged procedure 04/01/2017 with colostomy takedown, ileocecal resection with coloanal anastomosis and diverting loop ileostomy, 5 hours of LOA, and right ureteral stent exchange with psoas bladder hitch. Since that time she reported significant ileostomy output and was felt not to be keeping up with per oral intake. Had ureteral stent removed Jan 2019, creatinine noted to be up to 3 from baseline of 1. Had been given IVF's as outpatient, but labs showed acute renal failure with creatinine 4.28, so patient admitted by surgery on 1/28. Nephrology, urology, and hospitalists consulted. CT showed new colovesical fistula.   Assessment & Plan: Principal Problem:   Colovesical fistula Active Problems:   Primary hypothyroidism   DM type 2 (diabetes mellitus, type 2) ([place])   History of ovarian & endometrial cancer   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 s/p resection/partial vaginectomy/ LAR/colostomy 11/19/2014   Morbid obesity (HCC)   Ureteral stricture, right, 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) 04/01/2017   Pelvic cancer s/p colostomy takedown/loop ileostomy diversion 04/01/2017   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   Protein-calorie malnutrition, moderate (HCC)   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HCC)   Chronic kidney disease (CKD), stage III (moderate) (HCC)   Dehydration   ARF (acute renal failure) (HCC)   Adjustment disorder with mixed anxiety and depressed mood   Poorly controlled diabetes mellitus (HCC)   Pressure injury of skin   History of external beam radiation therapy to pelvis  Acute renal  failure on stage II CKD: Suspected prerenal etiology (both decreased po intake and high output ileostomy) without imaging evidence of urinary obstruction. As Dr. Tresa Moore points out, BUN has been elevated since ileostomy.  - for bilat PCN tubes today in IR - nephrology following, appreciate assistance.  Stopping iVF's today and giving IV lasix x 1 for vol excess - Trend BMP, UOP.   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 - Drs. Gross and Manny to discuss plans further - Foley placed to limit diversion, for bilateral PCN's today  Recurrent endometrial CA s/p resection, adjuvant radiation, partial vaginectomy, LAR/colostomy April 2016. No chemotherapy due to idiopathic neutropenia. History of breast CA s/p bilateral mastectomies: Stage IIA, ER/PR positive. Followed by Dr. Denman [name].  - Continue arimidex   Chronic idiopathic neutropenia: wbc up from 1.7 nadir now up 5.7, pt self-dosed filagrastin the night before admission at home   - Followed by Dr. Alvy Bimler ,  have added to the treatment team.   Iron deficiency anemia: - Hgb 7.9 > 8.4 after 1u PRBCs 1/31. Continue monitoring.  High output ileostomy:  - WOC consulted  Polymicrobial UTI: >100k Serratia marcescens, >80k GBS on UCx, susceptibilities pending. CXR without infiltrate, no cutaneous infection/PICC site/ostomy issues.  - Monitor blood cultures - Ciprofloxacin should cover both organisms. (hx of serious reactions to PCNs in the past and allergy documented to cefaclor). Will continue cipro.   Labile affect, adjustment disorder, possible depression: No current SI/HI.  - Zoloft started at admission.  - Would benefit from counseling as outpatient. Reports having significant social supports in place.  T2DM: HbA1c 5.3% in Aug 2018, now up to 7.9%.  - Holding oral agents including metformin  of course - Becoming more hyperglycemic: Augment to resistant SSI and continue HS correction, started lantus 10 u daily, increased to 20 u bid by general  surgery. BS's looking better now.  Titrate based on requirements.   Moderate protein-calorie malnutrition: Suspect adding protein would benefit patient even with renal failure, but defer to nephrology if protein should be restricted.  - RD consulted, supplement as pt can tolerate.   Hypokalemia, hypomagnesemia:  - Replacement per nephrology.  Hypothyroidism: TSH 2.408 - Continue stable dose synthroid.   Hyperlipidemia:  - Continue statin   DVT prophylaxis: Lovenox 30mg  Code Status: Full Family Communication: no family here Disposition Plan: Uncertain, suspect SNF will be needed  Consultants:   Hospitalists  Nephrology  Urology  Procedures:   None  Antimicrobials:  Ciprofloxacin 1/29 >>   Subjective:   Objective: Vitals:   09/01/17 1409 09/01/17 2059 09/02/17 0550 09/02/17 1514  BP: (!) 95/51 (!) 107/48 124/60 (!) 113/46  Pulse: 100 99 99 (!) 115  Resp: 16 16 18 18   Temp: 97.9 F (36.6 C) 100.3 F (37.9 C) 98.2 F (36.8 C) (!) 102.1 F (38.9 C)  TempSrc: Oral Oral Oral Oral  SpO2: 100% 99% 99% 100%  Weight:      Height:        Intake/Output Summary (Last 24 hours) at 09/02/2017 1528 Last data filed at 09/02/2017 1400 Gross per 24 hour  Intake 1462 ml  Output 1850 ml  Net -388 ml   Filed Weights   08/30/17 0552  Weight: 82.9 kg (182 lb 12.2 oz)   Gen: Chronically ill-appearing 62 y.o. female in no distress Pulm: Non-labored borderline tachypnea on room air. Clear to auscultation bilaterally.  CV: Regular tachycardia. No murmur, rub, or gallop. No JVD, no pedal edema. GI: Abdomen soft, mild tenderness diffusely without rebound or guarding, non-distended, with normoactive bowel sounds. No organomegaly or masses felt. Ext: Warm, no deformities Skin: Ileostomy site without tenderness, erythema, discharge; DL PICC right brachial c/d/i. Alopecia noted. Left index finger and thumb with intact blistering.  Neuro: Alert and oriented. No focal neurological  deficits. Psych: Judgement and insight appear normal. Mood labile with congruent, broad affect.    CBC: Recent Labs  Lab 08/30/17 0623 08/31/17 0451 09/01/17 0406 09/02/17 0334  WBC 2.8* 1.7* 4.3 5.7  NEUTROABS 1.8  --  3.1  --   HGB 9.4* 7.9* 8.4* 8.1*  HCT 26.8* 22.5* 24.5* 23.3*  MCV 84.3 83.6 85.4 86.0  PLT 222 204 200 419   Basic Metabolic Panel: Recent Labs  Lab 08/29/17 1550 08/30/17 0623 08/31/17 0451 09/01/17 0406 09/02/17 0334  NA 129* 130* 128* 126* 125*  K 3.2* 3.0* 3.6 3.0* 3.3*  CL 106 107 105 98* 94*  CO2 11* 10* 15* 19* 22  GLUCOSE 180* 182* 221* 306* 357*  BUN 79* 81* 69* 61* 59*  CREATININE 4.28* 4.24* 3.60* 3.46* 3.61*  CALCIUM 9.0 8.8* 8.1* 7.6* 7.6*  MG 1.1* 1.1* 1.3* 2.0 1.5*  PHOS 4.6  --  2.0* 2.5 2.5   Liver Function Tests: Recent Labs  Lab 08/29/17 1550 08/31/17 0451 09/01/17 0406 09/02/17 0334  AST 16  --   --   --   ALT 24  --   --   --   ALKPHOS 173*  --   --   --   BILITOT 0.5  --   --   --   PROT 6.8  --   --   --   ALBUMIN 2.1* 1.7*  1.6* 1.4*   HbA1C: Recent Labs    09/01/17 0406  HGBA1C 7.9*   CBG: Recent Labs  Lab 09/01/17 1656 09/01/17 2118 09/02/17 0721 09/02/17 1154 09/02/17 1337  GLUCAP 262* 272* 319* 194* 155*   Thyroid Function Tests: No results for input(s): TSH, T4TOTAL, FREET4, T3FREE, THYROIDAB in the last 72 hours. Anemia Panel: Recent Labs    08/31/17 0451  TIBC 164*  IRON 7*   Urine analysis:    Component Value Date/Time   COLORURINE YELLOW 08/30/2017 1240   APPEARANCEUR CLOUDY (A) 08/30/2017 1240   LABSPEC 1.006 08/30/2017 1240   LABSPEC 1.015 11/17/2015 1436   PHURINE 5.0 08/30/2017 1240   GLUCOSEU NEGATIVE 08/30/2017 1240   GLUCOSEU 2,000 11/17/2015 1436   HGBUR MODERATE (A) 08/30/2017 1240   BILIRUBINUR NEGATIVE 08/30/2017 1240   BILIRUBINUR neg 08/30/2016 1025   BILIRUBINUR Negative 11/17/2015 1436   KETONESUR NEGATIVE 08/30/2017 1240   PROTEINUR 100 (A) 08/30/2017 1240    UROBILINOGEN negative 08/30/2016 1025   UROBILINOGEN 0.2 11/17/2015 1436   NITRITE NEGATIVE 08/30/2017 1240   LEUKOCYTESUR LARGE (A) 08/30/2017 1240   LEUKOCYTESUR Moderate 11/17/2015 1436   Recent Results (from the past 240 hour(s))  Culture, Urine     Status: Abnormal   Collection Time: 08/30/17 10:10 AM  Result Value Ref Range Status   Specimen Description URINE, RANDOM  Final   Special Requests NONE  Final   Culture (A)  Final    >=100,000 COLONIES/mL SERRATIA MARCESCENS 80,000 COLONIES/mL GROUP B STREP(S.AGALACTIAE)ISOLATED TESTING AGAINST S. AGALACTIAE NOT ROUTINELY PERFORMED DUE TO PREDICTABILITY OF AMP/PEN/VAN SUSCEPTIBILITY. Performed at [facility], [place] [address] [place], [place] [address]    Report Status 09/01/2017 FINAL  Final   Organism ID, Bacteria SERRATIA MARCESCENS (A)  Final      Susceptibility   Serratia marcescens - MIC*    CEFAZOLIN >=64 RESISTANT Resistant     CEFTRIAXONE <=1 SENSITIVE Sensitive     CIPROFLOXACIN 1 SENSITIVE Sensitive     GENTAMICIN <=1 SENSITIVE Sensitive     NITROFURANTOIN 256 RESISTANT Resistant     TRIMETH/SULFA <=20 SENSITIVE Sensitive     * >=100,000 COLONIES/mL SERRATIA MARCESCENS  Culture, blood (Routine X 2) w Reflex to ID Panel     Status: None (Preliminary result)   Collection Time: 08/30/17  3:26 PM  Result Value Ref Range Status   Specimen Description   Final    BLOOD BLOOD LEFT HAND Performed at [facility] [address] [place], [place] [address]    Special Requests   Final    IN PEDIATRIC BOTTLE Blood Culture adequate volume Performed at [facility] [address], [place], [place] [address]    Culture   Final    NO GROWTH 3 DAYS Performed at [facility], [place] [address] [place], [place] [address]    Report Status PENDING  Incomplete  Culture, blood (Routine X 2) w Reflex to ID Panel     Status: None (Preliminary result)   Collection Time: 08/30/17  3:30 PM   Result Value Ref Range Status   Specimen Description   Final    BLOOD LEFT ANTECUBITAL Performed at [facility] [address], [place], [place] [address]    Special Requests   Final    IN PEDIATRIC BOTTLE Blood Culture adequate volume Performed at [facility] [address], [place],  [address]    Culture   Final  NO GROWTH 3 DAYS Performed at [facility], [place] [address] [place], [place] [address]    Report Status PENDING  Incomplete  Culture, Urine     Status: Abnormal (Preliminary result)   Collection Time: 08/31/17  6:10 PM  Result Value Ref Range Status   Specimen Description   Final    URINE, CATHETERIZED Performed at [facility], [place] [address] [place], [place] [address]    Special Requests   Final    NONE Performed at [facility], [place] [address], [place], [place] [address]    Culture >=100,000 COLONIES/mL SERRATIA MARCESCENS (A)  Final   Report Status PENDING  Incomplete      Radiology Studies: No results found.  Scheduled Meds: . acetaminophen  1,000 mg Oral TID  . anastrozole  1 mg Oral Daily  . [START ON 09/04/2017] cholecalciferol  10,000 Units Oral Weekly  . diphenoxylate-atropine  1-2 tablet Oral Q8H  . enoxaparin (LOVENOX) injection  30 mg Subcutaneous Q24H  . famotidine  20 mg Oral Daily  . furosemide  80 mg Intravenous Once  . insulin aspart  0-20 Units Subcutaneous TID WC  . insulin aspart  0-5 Units Subcutaneous QHS  . insulin glargine  20 Units Subcutaneous BID  . levothyroxine  150 mcg Oral QAC breakfast  . lip balm  1 application Topical BID  . loratadine  10 mg Oral q morning - 10a  . metoCLOPramide  5 mg Oral TID AC  . polyvinyl alcohol  1 drop Both Eyes QHS  . prenatal multivitamin  1 tablet Oral Daily  . protein supplement shake  11 oz Oral BID BM  . psyllium  1 packet Oral Daily  . rosuvastatin  10 mg Oral QPM  . [START ON 09/05/2017]  Tbo-Filgrastim  480 mcg Subcutaneous Weekly   Continuous Infusions: . sodium chloride    . albumin human    . chlorproMAZINE (THORAZINE) IV    . ciprofloxacin Stopped (09/02/17 1118)  . magnesium sulfate 1 - 4 g bolus IVPB Stopped (09/02/17 1032)  . methocarbamol (ROBAXIN)  IV    . potassium chloride 10 mEq (09/02/17 1526)  .  sodium bicarbonate  infusion 1000 mL 10 mL/hr at 09/02/17 1033     LOS: 4 days   Time spent: 35 minutes.  Kelly Splinter MD [facility] pgr [phone] 09/02/2017, 5:27 PM  [facility] [email or web address] Password [facility] 09/02/2017, 3:28 PM

## 2017-09-02 NOTE — Progress Notes (Signed)
Subjective/Chief Complaint:   1 - Bladder Injury / Right Ureteral Stricture - s/p robotic RIGHT ureteral reimplant with psoas hitch + HM Flap 04/01/17 at time of colostomy take down / adhesiolysis, small bowel resection, loop iliostomy for right distal stricture / bladder injury sustained at pelvic resection for advanced endometrial cancer 2016. Stent removed 08/17/17. FU CT 08/25/17 with resolved right hydro.   2 - Metastatic Endometrial Cancer - s/p repeat resection [phone] with colon and vaginal involvement but negative nodes / margins. Had adjuvant chemo-XRT which she is now done with. Follows with Dr. Alvy Bimler.   3 - Acute on Chronic Renal Failure / Severe Dehydration - pt with high output iliostomy, difficult to maintain hydration, has been on PRN IVF. Cr 4's on admit late 08/2017 up from baseline <1.5. CT few days ago without hydro. Her BUN / Cr ratio has been 20 or more since iliostomy. Renal US1/29 with some mild (stable) caliectasis that is stable x months and no right hydro at all.  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   4 - Bacteruria / Malaise- pt with bacteruria on admit. No fevers / leukocytosis. Most recent clonal CX 07/2017 (when still had foley in place) serratia and enterococcus sens cipro. [name] 1/29 - serratia / pending.   5 - Rectal - Bladder Fistula - some drainage of yellowiosh thin fluid from peri-rectal area with standing per report new late 08/2017. CT cystogram 1/30 confims NEW bladder neck - rectal fistula with large volume urine transit into GI tract distal to loop iliostomy. Out remained high even with foley drainage).  PMH sig for DM2, morbid obesity, breast cancer, ovarian cancer, endometrial cancer.    Today "[name]" is stable, she continues to have fairly high output across CV fistula despite foley drainage making hygiene nearly impossible.     Objective: Vital signs in last 24 hours: Temp:  [97.9 F (36.6 C)-100.3 F (37.9 C)] 98.2 F (36.8 C) (02/01 0550) Pulse Rate:  [99-100] 99 (02/01 0550) Resp:  [16-18] 18 (02/01 0550) BP: (95-124)/(48-60) 124/60 (02/01 0550) SpO2:  [99 %-100 %] 99 % (02/01 0550) Last BM Date: 09/01/17(ileostomy)  Intake/Output from previous day: 01/31 0701 - 02/01 0700 In: 2015 [P.O.:340; I.V.:1675] Out: 2425 [Urine:1350; Stool:1075] Intake/Output this shift: No intake/output data recorded.  General appearance: alert and with visible malaise and anxiety, husband at bedside as well.  Eyes: negative Nose: Nares normal. Septum midline. Mucosa normal. No drainage or sinus tenderness. Throat: lips, mucosa, and tongue normal; teeth and gums normal Neck: supple, symmetrical, trachea midline Back: symmetric, no curvature. ROM normal. No CVA tenderness. GI: stable large truncal obesity. Iliostomy patent of stool. Multiple scars w/o hernias. This yellow rectal drainage c/w known fistula.  Pelvic: external genitalia normal. Foley in place with yellow urine.  Extremities: extremities normal, atraumatic, no cyanosis or edema Skin: Skin color, texture, turgor normal. No rashes or lesions Lymph nodes: Cervical, supraclavicular, and axillary nodes normal. Neurologic: Grossly normal   Lab Results:  Recent Labs    09/01/17 0406 09/02/17 0334  WBC 4.3 5.7  HGB 8.4* 8.1*  HCT 24.5* 23.3*  PLT 200 184   BMET Recent Labs    09/01/17 0406 09/02/17 0334  NA 126* 125*  K 3.0* 3.3*  CL 98* 94*  CO2 19* 22  GLUCOSE 306* 357*  BUN 61* 59*  CREATININE 3.46* 3.61*  CALCIUM 7.6* 7.6*  PT/INR No results for input(s): LABPROT, INR in the last 72 hours. ABG No results for input(s): PHART, HCO3 in the last 72 hours.  Invalid input(s): PCO2, PO2  Studies/Results: Ct Abdomen Pelvis Wo Contrast  Result Date: 08/31/2017 CLINICAL DATA:  63 year old female status post bladder reconstruction in August  2018 for urinary leak. History of ovarian and uterine cancer in 2006 with recurrence in 2016 treated with chemotherapy and radiation therapy. EXAM: CT ABDOMEN AND PELVIS WITHOUT CONTRAST TECHNIQUE: Multidetector CT imaging of the abdomen and pelvis was performed following the standard protocol without IV contrast. COMPARISON:  CT the abdomen and pelvis 08/25/2017. FINDINGS: Lower chest: Bilateral breast implants incidentally noted. Otherwise, unremarkable. Hepatobiliary: Mild diffuse low attenuation throughout the hepatic parenchyma indicative of a background of mild hepatic steatosis. Calcified granuloma in the right lobe of the liver. No definite suspicious cystic or solid hepatic lesions are noted on today's noncontrast CT examination. Status post cholecystectomy. Pancreas: No pancreatic mass or peripancreatic inflammatory changes are noted on today's noncontrast CT examination. Spleen: Unremarkable. Adrenals/Urinary Tract: Moderate atrophy of the right kidney. Mild bilateral hydroureteronephrosis. Reflux of iodinated contrast material into the right renal collecting system. Nonobstructive calculi in the lower pole collecting system of left kidney, similar to prior study from 08/25/2017, measuring up to 11 mm. Urinary bladder is partially decompressed around an indwelling Foley catheter. 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 Bilateral adrenal glands are normal in appearance. Stomach/Bowel: Normal appearance of the stomach. No pathologic dilatation of small bowel or colon. Postoperative changes of partial colectomy and small bowel resections are noted, with right upper quadrant ostomy. Adjacent to the rectovesical fistula there is a short extension of contrast which extends posteriorly into the presacral space  and extends slightly cephalad, presumably a fistulous tract (best demonstrated on axial image 71 of series 2, sagittal images 50-58 of series 5 and coronal images 57-64 of series 4. Vascular/Lymphatic: No atherosclerotic calcifications in the abdominal or pelvic vasculature. No lymphadenopathy noted in the abdomen or pelvis. Reproductive: Status post total abdominal hysterectomy and bilateral salpingo-oophorectomy. Other: Trace volume of ascites in the low anatomic pelvis. No pneumoperitoneum. Musculoskeletal: There are no aggressive appearing lytic or blastic lesions noted in the visualized portions of the skeleton. IMPRESSION: 1. Rectovesical fistula, as above. 2. 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 3. Reflux of contrast material into the right kidney via the right ureter with moderate atrophy of the right kidney. Nonobstructive calculi in the lower pole collecting system of the left kidney measuring up to 11 mm. 4. Additional incidental findings, as above. These results will be called to the ordering clinician or representative by the Radiologist Assistant, and communication documented in the PACS or zVision Dashboard. Electronically Signed   By: Vinnie Langton M.D.   On: 08/31/2017 14:34   Dg Chest 2 View  Result Date: 08/31/2017 CLINICAL DATA:  Shortness of breath. EXAM: CHEST  2 VIEW COMPARISON:  Radiographs of August 30, 2017. FINDINGS: The heart size and mediastinal contours are within normal limits. Both lungs are clear. No pneumothorax or pleural effusion is noted. Right-sided PICC line is unchanged in position. The visualized skeletal structures are unremarkable. IMPRESSION: No active cardiopulmonary disease. Electronically Signed   By:  Marijo Conception, M.D.   On: 08/31/2017 10:06    Anti-infectives: Anti-infectives (From admission, onward)   Start     Dose/Rate Route  Frequency Ordered Stop   08/30/17 1000  ciprofloxacin (CIPRO) IVPB 200 mg     200 mg 100 mL/hr over 60 Minutes Intravenous Every 24 hours 08/30/17 0855        Assessment/Plan:  1 - Bladder Injury / Right Ureteral Stricture - imaging post-repair favorable, less right hydro than in years which suggests succesfull repair. Her mild left caliectasis is chronic and stable accounting for different imaging modalities.   2 - Metastatic Endometrial Cancer - no evidence of active disease, per med-oncology.   3 - Acute on Chronic Renal Failure / Severe Dehydration - her acute BUN and Cr rise appears to be mostly pre-renal + exacerbation / artifact from systemic reabsorption via contact with colon via fistula.   Greatly appreciate nephrology comanagment.   4 - Bacteruria - continue low dose cipro pending further CX data.    5 - Rectal - Bladder Fistula - Rec bilat neph tubes. This will hopefully more completely divert urine away from known fistula. This will likely improve present quality of life / functional status and possibley allow for closure of fistula.  NPO and bilat neph tubes ordered.   Will follow  Please call me directly with questions.   [facility], Jovee Dettinger 09/02/2017

## 2017-09-02 NOTE — Progress Notes (Signed)
MEDICATION-RELATED CONSULT NOTE   IR Procedure Consult - Anticoagulant/Antiplatelet PTA/Inpatient Med List Review by Pharmacist   Post-Procedural bleeding risk per IR MD assessment:  low  Antithrombotic medications on inpatient or PTA profile prior to procedure:   LMWH 30 qhs   Plan:    Resume LMWH 30 qhs tonight Eudelia Bunch, Pharm.D. [phone] 09/02/2017 5:30 PM

## 2017-09-02 NOTE — Progress Notes (Signed)
[facility]  [address]  [place], [place] [address] Phone: [phone]  FAX: [phone]      [name] [id number] 07/15/1956  CARE TEAM:  PCP: Lowne Chase, Yvonne R, DO  Outpatient Care Team: Patient Care Team: Lowne Chase, Yvonne R, DO as PCP - General (Family Medicine) Gorsuch, Ni, MD as Consulting Physician (Hematology and Oncology) Rossi, Emma, MD as Consulting Physician (Obstetrics and Gynecology) , , MD as Consulting Physician (General Surgery) Manny, Theodore, MD as Consulting Physician (Urology) Altheimer, Michael, MD as Consulting Physician (Endocrinology) Lyles, Graham, MD as Consulting Physician (Ophthalmology) Thimmappa, Brinda, MD as Consulting Physician (Plastic Surgery) Upton, Elizabeth, MD as Consulting Physician (Nephrology)  Inpatient Treatment Team: Treatment Team: Attending Provider: , , MD; Consulting Physician: Manny, Theodore, MD; Registered Nurse: McNichol, Laurie L, RN; Registered Nurse: Passmore, Beth A, RN; Technician: Guy, Dilva C, NT; Consulting Physician: Dunham, Cynthia, MD; Consulting Physician: Triadhosp, Wl6, MD; Consulting Physician: , , MD; Technician: Bryant-Briggs, Belinda, NT; Consulting Physician: Grunz, Ryan B, MD; Consulting Physician: Gorsuch, Ni, MD; Technician: Totten, Yolanda M, NT; Occupational Therapist: Spencer, Maryellen, OT; Registered Nurse: Paudel, Bishnu D, RN   Problem List:   Principal Problem:   Colovesical fistula - delayed Active Problems:   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 s/p resection/partial vaginectomy/ LAR/colostomy 11/19/2014   Ureteral stricture, right, 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) 04/01/2017   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   Primary hypothyroidism   DM type 2 (diabetes mellitus, type 2) (HCC)   History of ovarian & endometrial cancer   Morbid obesity (HCC)   Pelvic  cancer s/p colostomy takedown/loop ileostomy diversion 04/01/2017   Protein-calorie malnutrition, moderate (HCC)   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HCC)   Chronic kidney disease (CKD), stage III (moderate) (HCC)   Dehydration   ARF (acute renal failure) (HCC)   Adjustment disorder with mixed anxiety and depressed mood   Poorly controlled diabetes mellitus (HCC)   Pressure injury of skin   History of external beam radiation therapy to pelvis  SURGERY 04/01/2017     POST-OPERATIVE DIAGNOSIS:   Recurrent endometrial cancer status post low anterior resection and partial pelvic exeneration  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   SURGEON: C. , MD- Primary (Dr Manny assist) PROCEDURE:  XI ROBOTIC LYSIS OF ADHESIONS x 5 hours XI ROBOTIC COLOSTOMY TAKEDOWN ROBOTIC SEWN COLOANAL ANASTOMOSIS Ileocecal resection with anastomosis Diverting loop ileostomy  SURGEON: Theodore Manny, MD - Primary (Dr  assist) PROCEDURE:  Cystoscopy with R retrograde and stent exchange.  Robotic R ureteral reimplant with psoas bladder hitch.  Consults: urology   SURGERY 08/17/2017  DIAGNOSIS:  Right ureteral stricture, status post complex reconstruction.  PROCEDURES: 1. Cystoscopy with right retrograde pyelogram. 2. Removal of right ureteral stent.  PHYSICIAN:  Theodore Manny, MD        SPECIMENS:  Right ureteral stone for discard, inspected and intact.  FINDINGS: 1. Fusiform bladder shape consistent with prior psoas hitch. 2. Visibly patent ureteral-bladder anastomosis. 3. No evidence of hydronephrosis or narrowing with right retrograde     pyelogram.    Assessment  FAIR  Ileus from bladder leak resolving.  Delayed bladder leak turned into colovesical fistula with preferential urinary drainage in decompressed diverted colon limb.  Plan:   Discussed with urology.  Dr. Manny.  Discussed with nephrology, Dr. Dunham.  Both are inclined to offer  percutaneous nephrostomy tubes to divert urine directly out of kidneys and out of the bladder that is draining   into her neorectum/colon.    Perhaps it can close down that way.  As mentioned before, I am highly skeptical that given her radiated bladder that had been aggressively treated with internal/external drainage and stenting and still developed a problem.  Most likely this is a leak on the left lateral inferiolateral corner of the cystostomy Heineke-Mikulicz and psoas hitch needed for the bladder to reach the right ureter at that had the chronic severe distal stricture.  I am highly skeptical that a radiated bladder with a delayed leak will spontaneously heal.  She had internal Foley drainage, external surgical drainage, internal stenting drainage for a few months, yet this happened.   Therefore, most likely will require surgical repair.  Standard of care usually on this situation is to resect colon & do a new distal anastomosis. Primarily repair the bladder & patch it.  However, there is no way I can redo anastomosis in a radiated pelvis with prior surgeries with prior anterior exoneration, radiation, 7-hour lysis adhesions and prior surgery when she was in better shape.  Only options I can see is to try & primarily patch the colon with OMENTOPEXY, primarily repair the bladder with OMENTOPEXY, and hope it will heal.  I am skeptical that it will.  Most likely we will have to convert to an end colostomy to get the colon away from it and allow the bladder to be patched.  I do agree with Dr. Manny that she is rather malnourished and deconditioned at this point so would like to try and hold off on definitive repair if we can.  I do not think a permanent loop ileostomy is going to be a good solution for her as she cannot tolerate it well.  We will see.  She actually talked about converting to a colostomy.  She would really like it to be infraumbilical so she can hide it in her pants.  I have found trying to put  colostomies through panniculis fails pretty badly.  Also risks increased parastomal hernia or risk with doing that.  I suspect that her bladder is probably not salvageable and she may need a completion cystectomy with ileal conduit.  I will defer to Dr. Manny's expertise with urology.  We will discuss that later once she is in better shape to actually tolerate a reoperation should it come to that (very likely).  Zoloft for depression.  Diabetes still poorly controlled.  Agree with starting Lantus.  I increased it to 20 twice daily to try and more aggressively correct things.  Until her glucose is less than 150, she will never rally  Solid diet with supp shakes.  Thankfully, her nausea vomiting and ileus seem to be resolving.  -Ostomy nurse consultation for ileostomy bag   Ileus resolving.  Restart antidiarrheal regimen and fiber to help control things.  -Continue to monitor lytes  -Hemoglobin low but stable in pancytopenic pt  -Continue neupogen injections for pancytopenia  -Continue Rheumatex PO  -VTE prophylaxis: SCDs, etc  -Ambulate as tolerated to help recovery  Patient and husband still hoping that she could be my patient.  I noted I keep fighting with her despite her complicated medical and surgical history giving her more problems.  She is still quite emotionally devastated and exhausted.  However she is not as tearful and not crying today.  Getting support from her church and is taking Eucharist later today as been more optimistic.  They expressed understanding and appreciation.  Hopefully we can get her to a   better situation.    Home health nursing seems to have failed.  Lack of outpatient ostomy clinic in town here making outpatient management challenging.   I strongly suspect she will need a SNF:     40 minutes spent in review, evaluation, examination, counseling, and coordination of care.  More than 50% of that time was spent in counseling.   C. , M.D.,  F.A.C.S. Gastrointestinal and Minimally Invasive Surgery [facility] [address] [place], [place] [address] [phone] Main / Paging    09/02/2017    Subjective: (Chief complaint)  Tired. Operating solid diet.  Had blew up from her ileostomy bag last night.  Disheartened but at least keeping food down.    Foley catheter placed.  Making urine.  Still complaining of rectal leakage when she walks around despite the Foley catheter.  Having to wear diapers.  Embarrassing and frustrating.  This seems to be most exasperating and debilitating thing for her right now.     Objective:  Vital signs:  Vitals:   09/01/17 0544 09/01/17 1409 09/01/17 2059 09/02/17 0550  BP: (!) 104/58 (!) 95/51 (!) 107/48 124/60  Pulse: 89 100 99 99  Resp: 16 16 16 18  Temp: 98.9 F (37.2 C) 97.9 F (36.6 C) 100.3 F (37.9 C) 98.2 F (36.8 C)  TempSrc: Oral Oral Oral Oral  SpO2: 100% 100% 99% 99%  Weight:      Height:        Last BM Date: 09/01/17(ileostomy)  Intake/Output   Yesterday:  01/31 0701 - 02/01 0700 In: 2015 [P.O.:340; I.V.:1675] Out: 2425 [Urine:1350; Stool:1075] This shift:  Total I/O In: 700 [P.O.:100; I.V.:600] Out: 1650 [Urine:1000; Stool:650]  Bowel function:  Flatus: YES   BM:  YES.  Thick oatmeal consistency succus in ileostomy bag.  Drain: No drains.     Physical Exam:  General: Pt awake/alert/oriented x4 in no acute distress Eyes: PERRL, normal EOM.  Sclera clear.  No icterus Neuro: CN II-XII intact w/o focal sensory/motor deficits. Lymph: No head/neck/groin lymphadenopathy Psych:  No delerium/psychosis/paranoia.  Tearful.  Occasionally moaning/crying.  More consolable than last week though.   HENT: Normocephalic, Mucus membranes moist.  No thrush Neck: Supple, No tracheal deviation Chest: No chest wall pain w good excursion CV:  Pulses intact.  Regular rhythm MS: Normal AROM mjr joints.  No obvious  deformity  Abdomen: Soft.  Nondistended.  No tenderness to palpation.  No evidence of peritonitis.  No incarcerated hernias.  No wounds.  Ileostomy pink with gas and succus in bag.      GU.  No purulent drainage or hematoma.  Foley in place with clear light yellow urine  Rectal deferred.  Last week had stricturing at coloanal anastomosis.  Had been finger dilated.  Held off on that today. Ext:   No deformity.  No mjr edema.  No cyanosis Skin: No petechiae / purpura  Results:   Labs: Results for orders placed or performed during the hospital encounter of 08/29/17 (from the past 48 hour(s))  Glucose, capillary     Status: Abnormal   Collection Time: 08/31/17  7:08 AM  Result Value Ref Range   Glucose-Capillary 187 (H) 65 - 99 mg/dL  Glucose, capillary     Status: Abnormal   Collection Time: 08/31/17 11:53 AM  Result Value Ref Range   Glucose-Capillary 328 (H) 65 - 99 mg/dL  Glucose, capillary     Status: Abnormal   Collection Time: 08/31/17  4:45 PM    Result Value Ref Range   Glucose-Capillary 241 (H) 65 - 99 mg/dL  Prepare RBC     Status: None   Collection Time: 08/31/17  6:11 PM  Result Value Ref Range   Order Confirmation ORDER PROCESSED BY BLOOD BANK   Type and screen [place]     Status: None   Collection Time: 08/31/17  6:11 PM  Result Value Ref Range   ABO/RH(D) A POS    Antibody Screen NEG    Sample Expiration 09/03/2017    Unit Number [id number]    Blood Component Type RED CELLS,LR    Unit division 00    Status of Unit ISSUED,FINAL    Transfusion Status OK TO TRANSFUSE    Crossmatch Result Compatible   Glucose, capillary     Status: Abnormal   Collection Time: 08/31/17  9:06 PM  Result Value Ref Range   Glucose-Capillary 308 (H) 65 - 99 mg/dL  Hemoglobin A1c     Status: Abnormal   Collection Time: 09/01/17  4:06 AM  Result Value Ref Range   Hgb A1c MFr Bld 7.9 (H) 4.8 - 5.6 %    Comment: (NOTE) Pre diabetes:           5.7%-6.4% Diabetes:              >6.4% Glycemic control for   <7.0% adults with diabetes    Mean Plasma Glucose 180.03 mg/dL    Comment: Performed at [facility], [place]. [address], [place], [place] [address]  Renal function panel     Status: Abnormal   Collection Time: 09/01/17  4:06 AM  Result Value Ref Range   Sodium 126 (L) 135 - 145 mmol/L   Potassium 3.0 (L) 3.5 - 5.1 mmol/L   Chloride 98 (L) 101 - 111 mmol/L   CO2 19 (L) 22 - 32 mmol/L   Glucose, Bld 306 (H) 65 - 99 mg/dL   BUN 61 (H) 6 - 20 mg/dL   Creatinine, Ser 3.46 (H) 0.44 - 1.00 mg/dL   Calcium 7.6 (L) 8.9 - 10.3 mg/dL   Phosphorus 2.5 2.5 - 4.6 mg/dL   Albumin 1.6 (L) 3.5 - 5.0 g/dL   GFR calc non Af Amer 13 (L) >60 mL/min   GFR calc Af Amer 15 (L) >60 mL/min    Comment: (NOTE) The eGFR has been calculated using the CKD EPI equation. This calculation has not been validated in all clinical situations. eGFR's persistently <60 mL/min signify possible Chronic Kidney Disease.    Anion gap 9 5 - 15  CBC with Differential/Platelet     Status: Abnormal   Collection Time: 09/01/17  4:06 AM  Result Value Ref Range   WBC 4.3 4.0 - 10.5 K/uL   RBC 2.87 (L) 3.87 - 5.11 MIL/uL   Hemoglobin 8.4 (L) 12.0 - 15.0 g/dL   HCT 24.5 (L) 36.0 - 46.0 %   MCV 85.4 78.0 - 100.0 fL   MCH 29.3 26.0 - 34.0 pg   MCHC 34.3 30.0 - 36.0 g/dL   RDW 14.8 11.5 - 15.5 %   Platelets 200 150 - 400 K/uL   Neutrophils Relative % 73 %   Lymphocytes Relative 6 %   Monocytes Relative 19 %   Eosinophils Relative 2 %   Basophils Relative 0 %   Neutro Abs 3.1 1.7 - 7.7 K/uL   Lymphs Abs 0.3 (L) 0.7 - 4.0 K/uL   Monocytes Absolute 0.8 0.1 - 1.0 K/uL  Eosinophils Absolute 0.1 0.0 - 0.7 K/uL   Basophils Absolute 0.0 0.0 - 0.1 K/uL   WBC Morphology TOXIC GRANULATION   Magnesium     Status: None   Collection Time: 09/01/17  4:06 AM  Result Value Ref Range   Magnesium 2.0 1.7 - 2.4 mg/dL  Glucose, capillary     Status: Abnormal   Collection  Time: 09/01/17  7:50 AM  Result Value Ref Range   Glucose-Capillary 250 (H) 65 - 99 mg/dL  Glucose, capillary     Status: Abnormal   Collection Time: 09/01/17 11:37 AM  Result Value Ref Range   Glucose-Capillary 352 (H) 65 - 99 mg/dL  Glucose, capillary     Status: Abnormal   Collection Time: 09/01/17  4:56 PM  Result Value Ref Range   Glucose-Capillary 262 (H) 65 - 99 mg/dL  Glucose, capillary     Status: Abnormal   Collection Time: 09/01/17  9:18 PM  Result Value Ref Range   Glucose-Capillary 272 (H) 65 - 99 mg/dL  CBC     Status: Abnormal   Collection Time: 09/02/17  3:34 AM  Result Value Ref Range   WBC 5.7 4.0 - 10.5 K/uL   RBC 2.71 (L) 3.87 - 5.11 MIL/uL   Hemoglobin 8.1 (L) 12.0 - 15.0 g/dL   HCT 23.3 (L) 36.0 - 46.0 %   MCV 86.0 78.0 - 100.0 fL   MCH 29.9 26.0 - 34.0 pg   MCHC 34.8 30.0 - 36.0 g/dL   RDW 15.1 11.5 - 15.5 %   Platelets 184 150 - 400 K/uL  Magnesium     Status: Abnormal   Collection Time: 09/02/17  3:34 AM  Result Value Ref Range   Magnesium 1.5 (L) 1.7 - 2.4 mg/dL  Renal function panel     Status: Abnormal   Collection Time: 09/02/17  3:34 AM  Result Value Ref Range   Sodium 125 (L) 135 - 145 mmol/L   Potassium 3.3 (L) 3.5 - 5.1 mmol/L   Chloride 94 (L) 101 - 111 mmol/L   CO2 22 22 - 32 mmol/L   Glucose, Bld 357 (H) 65 - 99 mg/dL   BUN 59 (H) 6 - 20 mg/dL   Creatinine, Ser 3.61 (H) 0.44 - 1.00 mg/dL   Calcium 7.6 (L) 8.9 - 10.3 mg/dL   Phosphorus 2.5 2.5 - 4.6 mg/dL   Albumin 1.4 (L) 3.5 - 5.0 g/dL   GFR calc non Af Amer 12 (L) >60 mL/min   GFR calc Af Amer 14 (L) >60 mL/min    Comment: (NOTE) The eGFR has been calculated using the CKD EPI equation. This calculation has not been validated in all clinical situations. eGFR's persistently <60 mL/min signify possible Chronic Kidney Disease.    Anion gap 9 5 - 15    Imaging / Studies: Ct Abdomen Pelvis Wo Contrast  Result Date: 08/31/2017 CLINICAL DATA:  62 year old female status post  bladder reconstruction in August 2018 for urinary leak. History of ovarian and uterine cancer in 2006 with recurrence in 2016 treated with chemotherapy and radiation therapy. EXAM: CT ABDOMEN AND PELVIS WITHOUT CONTRAST TECHNIQUE: Multidetector CT imaging of the abdomen and pelvis was performed following the standard protocol without IV contrast. COMPARISON:  CT the abdomen and pelvis 08/25/2017. FINDINGS: Lower chest: Bilateral breast implants incidentally noted. Otherwise, unremarkable. Hepatobiliary: Mild diffuse low attenuation throughout the hepatic parenchyma indicative of a background of mild hepatic steatosis. Calcified granuloma in the right lobe of the liver. No definite suspicious  cystic or solid hepatic lesions are noted on today's noncontrast CT examination. Status post cholecystectomy. Pancreas: No pancreatic mass or peripancreatic inflammatory changes are noted on today's noncontrast CT examination. Spleen: Unremarkable. Adrenals/Urinary Tract: Moderate atrophy of the right kidney. Mild bilateral hydroureteronephrosis. Reflux of iodinated contrast material into the right renal collecting system. Nonobstructive calculi in the lower pole collecting system of left kidney, similar to prior study from 08/25/2017, measuring up to 11 mm. Urinary bladder is partially decompressed around an indwelling Foley catheter. 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 Bilateral adrenal glands are normal in appearance. Stomach/Bowel: Normal appearance of the stomach. No pathologic dilatation of small bowel or colon. Postoperative changes of partial colectomy and small bowel resections are noted, with right upper quadrant ostomy. Adjacent to the rectovesical fistula there is a short extension of contrast which extends  posteriorly into the presacral space and extends slightly cephalad, presumably a fistulous tract (best demonstrated on axial image 71 of series 2, sagittal images 50-58 of series 5 and coronal images 57-64 of series 4. Vascular/Lymphatic: No atherosclerotic calcifications in the abdominal or pelvic vasculature. No lymphadenopathy noted in the abdomen or pelvis. Reproductive: Status post total abdominal hysterectomy and bilateral salpingo-oophorectomy. Other: Trace volume of ascites in the low anatomic pelvis. No pneumoperitoneum. Musculoskeletal: There are no aggressive appearing lytic or blastic lesions noted in the visualized portions of the skeleton. IMPRESSION: 1. Rectovesical fistula, as above. 2. 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 3. Reflux of contrast material into the right kidney via the right ureter with moderate atrophy of the right kidney. Nonobstructive calculi in the lower pole collecting system of the left kidney measuring up to 11 mm. 4. Additional incidental findings, as above. These results will be called to the ordering clinician or representative by the Radiologist Assistant, and communication documented in the PACS or zVision Dashboard. Electronically Signed   By: Daniel  Entrikin M.D.   On: 08/31/2017 14:34   Dg Chest 2 View  Result Date: 08/31/2017 CLINICAL DATA:  Shortness of breath. EXAM: CHEST  2 VIEW COMPARISON:  Radiographs of August 30, 2017. FINDINGS: The heart size and mediastinal contours are within normal limits. Both lungs are clear. No pneumothorax or pleural effusion is noted. Right-sided PICC line is unchanged in position. The visualized skeletal structures are unremarkable. IMPRESSION: No active cardiopulmonary disease. Electronically Signed   By: James  Green Jr, M.D.   On: 08/31/2017 10:06    Medications / Allergies: per  chart  Antibiotics: Anti-infectives (From admission, onward)   Start     Dose/Rate Route Frequency Ordered Stop   08/30/17 1000  ciprofloxacin (CIPRO) IVPB 200 mg     200 mg 100 mL/hr over 60 Minutes Intravenous Every 24 hours 08/30/17 0855          Note: Portions of this report may have been transcribed using voice recognition software. Every effort was made to ensure accuracy; however, inadvertent computerized transcription errors may be present.   Any transcriptional errors that result from this process are unintentional.      C. , M.D., F.A.C.S. Gastrointestinal and Minimally Invasive Surgery [facility] [address] [place], [place] [address] [phone] Main / Paging      09/02/2017 

## 2017-09-02 NOTE — Progress Notes (Signed)
OT Cancellation Note  Patient Details Name: [name] MRN: [id number] DOB: 04-Oct-1955   Cancelled Treatment:    Reason Eval/Treat Not Completed: Other (comment). Pt's catheter is leaking a lot and plan is for nephrostomy tubes later today. Will check back over the weekend if possible.  Quaneshia Wareing 09/02/2017, 9:24 AM  Lesle Chris, OTR/L [phone] 09/02/2017

## 2017-09-02 NOTE — Procedures (Signed)
Interventional Radiology Procedure Note  Procedure:  Bilateral PCN placement.  57f tubes bilateral.  Complications: None Recommendations:  - to gravity - Do not submerge - Routine drain care   Signed,  Dulcy Fanny. Earleen Newport, DO

## 2017-09-02 NOTE — Progress Notes (Signed)
PT Cancellation Note  Patient Details Name: [name] MRN: [id number] DOB: 12-14-1955   Cancelled Treatment:      NPO for bilateral perc tubes placement later today.  Will check back as schedule permits.     Rica Koyanagi  PTA [facility]  Acute  Rehab Pager      [phone]

## 2017-09-02 NOTE — Progress Notes (Signed)
Please hang calorie count envelope on the patient's door. Document percent consumed for each item on the patient's meal tray ticket and keep in envelope. Also document percent of any supplement or snack pt consumes and keep documentation in envelope for RD to review.   Pt NPO for procedure 2/1. Please begin 48 hour calorie count on 2/2 breakfast and run until 2/4 breakfast.   RD WILL REMOVE CALORIE COUNT ENVELOPE.   Mariana Single RD, LDN Clinical Nutrition Pager # [phone]

## 2017-09-02 NOTE — Progress Notes (Signed)
[facility] Rounding Note  Subjective/Interval History:   NPO for bilateral perc tubes Still feels SOB (and now with some edema) Albumin low so 3rd spacing IVF still at 50  Says "leaking terribly" from her rectum saturating the bed (despite foley decompression of her bladder)  Objective Vital signs in last 24 hours: Vitals:   09/01/17 0544 09/01/17 1409 09/01/17 2059 09/02/17 0550  BP: (!) 104/58 (!) 95/51 (!) 107/48 124/60  Pulse: 89 100 99 99  Resp: 16 16 16 18   Temp: 98.9 F (37.2 C) 97.9 F (36.6 C) 100.3 F (37.9 C) 98.2 F (36.8 C)  TempSrc: Oral Oral Oral Oral  SpO2: 100% 100% 99% 99%  Weight:      Height:       Weight change:   Intake/Output Summary (Last 24 hours) at 09/02/2017 0931 Last data filed at 09/02/2017 0600 Gross per 24 hour  Intake 1775 ml  Output 2325 ml  Net -550 ml   Physical examination BP 124/60 (BP Location: Right Leg)   Pulse 99   Temp 98.2 F (36.8 C) (Oral)   Resp 18   Ht 5\' 3"  (1.6 m)   Wt 82.9 kg (182 lb 12.2 oz)   SpO2 99%   BMI 32.37 kg/m   Pale WF Husband in the room with her Waiting on perc tubes Sats OK on 2liters Complains of SOB Extremely pale Lungs with basilar crackles ^ on right Normal heart sounds S1S2 No S3 No murmur Abd with ileostomy bag  Remains mildly tender in RLQ + BS 1+ LE edema, some sacral edema PICC R arm   Recent Labs  Lab 08/29/17 1550 08/30/17 0623 08/31/17 0451 09/01/17 0406 09/02/17 0334  NA 129* 130* 128* 126* 125*  K 3.2* 3.0* 3.6 3.0* 3.3*  CL 106 107 105 98* 94*  CO2 11* 10* 15* 19* 22  GLUCOSE 180* 182* 221* 306* 357*  BUN 79* 81* 69* 61* 59*  CREATININE 4.28* 4.24* 3.60* 3.46* 3.61*  CALCIUM 9.0 8.8* 8.1* 7.6* 7.6*  PHOS 4.6  --  2.0* 2.5 2.5   Recent Labs  Lab 08/29/17 1550 08/31/17 0451 09/01/17 0406 09/02/17 0334  AST 16  --   --   --   ALT 24  --   --   --   ALKPHOS 173*  --   --   --   BILITOT 0.5  --   --   --   PROT 6.8  --   --   --   ALBUMIN 2.1* 1.7* 1.6* 1.4*     Recent Labs  Lab 08/30/17 0623 08/31/17 0451 09/01/17 0406 09/02/17 0334  WBC 2.8* 1.7* 4.3 5.7  NEUTROABS 1.8  --  3.1  --   HGB 9.4* 7.9* 8.4* 8.1*  HCT 26.8* 22.5* 24.5* 23.3*  MCV 84.3 83.6 85.4 86.0  PLT 222 204 200 184    Recent Labs  Lab 09/01/17 0750 09/01/17 1137 09/01/17 1656 09/01/17 2118 09/02/17 0721  GLUCAP 250* 352* 262* 272* 319*    Studies/Results: Ct Abdomen Pelvis Wo Contrast  Result Date: 08/31/2017 CLINICAL DATA:  62 year old female status post bladder reconstruction in August 2018 for urinary leak. History of ovarian and uterine cancer in 2006 with recurrence in 2016 treated with chemotherapy and radiation therapy. EXAM: CT ABDOMEN AND PELVIS WITHOUT CONTRAST TECHNIQUE: Multidetector CT imaging of the abdomen and pelvis was performed following the standard protocol without IV contrast. COMPARISON:  CT the abdomen and pelvis 08/25/2017. FINDINGS: Lower chest: Bilateral breast implants  incidentally noted. Otherwise, unremarkable. Hepatobiliary: Mild diffuse low attenuation throughout the hepatic parenchyma indicative of a background of mild hepatic steatosis. Calcified granuloma in the right lobe of the liver. No definite suspicious cystic or solid hepatic lesions are noted on today's noncontrast CT examination. Status post cholecystectomy. Pancreas: No pancreatic mass or peripancreatic inflammatory changes are noted on today's noncontrast CT examination. Spleen: Unremarkable. Adrenals/Urinary Tract: Moderate atrophy of the right kidney. Mild bilateral hydroureteronephrosis. Reflux of iodinated contrast material into the right renal collecting system. Nonobstructive calculi in the lower pole collecting system of left kidney, similar to prior study from 08/25/2017, measuring up to 11 mm. Urinary bladder is partially decompressed around an indwelling Foley catheter. 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 Bilateral adrenal glands are normal in appearance. Stomach/Bowel: Normal appearance of the stomach. No pathologic dilatation of small bowel or colon. Postoperative changes of partial colectomy and small bowel resections are noted, with right upper quadrant ostomy. Adjacent to the rectovesical fistula there is a short extension of contrast which extends posteriorly into the presacral space and extends slightly cephalad, presumably a fistulous tract (best demonstrated on axial image 71 of series 2, sagittal images 50-58 of series 5 and coronal images 57-64 of series 4. Vascular/Lymphatic: No atherosclerotic calcifications in the abdominal or pelvic vasculature. No lymphadenopathy noted in the abdomen or pelvis. Reproductive: Status post total abdominal hysterectomy and bilateral salpingo-oophorectomy. Other: Trace volume of ascites in the low anatomic pelvis. No pneumoperitoneum. Musculoskeletal: There are no aggressive appearing lytic or blastic lesions noted in the visualized portions of the skeleton. IMPRESSION: 1. Rectovesical fistula, as above. 2. 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 3. Reflux of contrast material into the right kidney via the right ureter with moderate atrophy of the right kidney. Nonobstructive calculi in the lower pole collecting system of the left kidney measuring up to 11 mm. 4. Additional incidental findings, as above. These results will be called to the ordering clinician or representative by the Radiologist Assistant, and communication documented in the PACS or zVision Dashboard. Electronically Signed   By: Vinnie Langton M.D.   On: 08/31/2017 14:34   Medications: . sodium chloride    . chlorproMAZINE (THORAZINE) IV     . ciprofloxacin Stopped (09/01/17 1207)  . methocarbamol (ROBAXIN)  IV    .  sodium bicarbonate  infusion 1000 mL 50 mL/hr at 09/01/17 1448   . acetaminophen  1,000 mg Oral TID  . anastrozole  1 mg Oral Daily  . [START ON 09/04/2017] cholecalciferol  10,000 Units Oral Weekly  . diphenoxylate-atropine  1-2 tablet Oral Q8H  . enoxaparin (LOVENOX) injection  30 mg Subcutaneous Q24H  . famotidine  20 mg Oral Daily  . insulin aspart  0-20 Units Subcutaneous TID WC  . insulin aspart  0-5 Units Subcutaneous QHS  . insulin glargine  20 Units Subcutaneous BID  . levothyroxine  150 mcg Oral QAC breakfast  . lip balm  1 application Topical BID  . loratadine  10 mg Oral q morning - 10a  . metoCLOPramide  5 mg Oral TID AC  . polyvinyl alcohol  1 drop Both Eyes QHS  . prenatal multivitamin  1 tablet Oral Daily  . protein  supplement shake  11 oz Oral BID BM  . psyllium  1 packet Oral Daily  . rosuvastatin  10 mg Oral QPM  . [START ON 09/05/2017] Tbo-Filgrastim  480 mcg Subcutaneous Weekly   Assessment/Recommendations 1. Acute kidney injury - Volume depletion was top of the list on admission 2/2 inadequate intake in face of high output ileostomy. Some degree of hydro chronically. New urine leak identified with rectovesical fistula. Whatever proximate cause of renal failure is, has been present since at least 08/17/17 with SCr 3 when ureteral stent removed (baseline 1). Could certainly have had ATN from prolonged volume depletion and can't exclude effects of mechanical obstruction long term (chronic but "stable" hydro per Urology) 1. Volume now replete, some overload. 3rd spacing fluids 2/2 severe hypoalbuminemia. 2. Stop IVF  3. 1 dose Lasix with albumin (after K repletion see below) 4. Stop isotonic bicarb  5. HOPE to see renal improvement with perc tubes 2.  Metabolic acidosis  - on metformin with AKI accounting for acidosis with large GI losses.. 3.  Hypokalemia - repeat 4 more runs today 4.   Hypomagnesemia - Mag back down to 1.5. Replete again 5.  Anemia - Hb down to 7.9 with hydration.Transfused 1 U 1/31. Fe stores VERY low but no IV Fe while on IV ATB's 6.  Polymicrobial UTI - serratia and Group B strep. Cipro. BC's neg. 7.  Chronic neutropenia - last dosed herself with filagrastin on PM prior to admission (so 1/27). WBC up some today.   [name], MD [facility] [phone] pager 09/02/2017, 9:31 AM

## 2017-09-02 NOTE — Progress Notes (Signed)
Referring Physician(s): Manny,T  Supervising Physician: Corrie Mckusick  Patient Status:  [facility] - In-pt  Chief Complaint: Bladder leak   Subjective: Patient familiar to IR service from prior PICC line placements as well as pelvic drain placement on 04/16/17.  She has a history of breast cancer as well as recurrent endometrial cancer with prior low anterior resection, partial vaginectomy, and colostomy in April 2016.  She underwent colostomy takedown with loop ileostomy diversion in August 2018.  She has also had robotic right ureteral reimplant/double-J stent with psoas bladder hitch in 2018 with stent removal on 08/17/17.  She is currently leaking fluid from her rectum despite Foley decompression of the bladder.  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  There is reflux of contrast into the right kidney via the right ureter with moderate atrophy of the right kidney and nonobstructive calculi in the lower pole of the left kidney.  Request now received from urology for bilateral percutaneous nephrostomies to divert urine away from fistula. She currently denies fever, headache, chest pain, nausea ,vomiting or bleeding.  She does have some dyspnea.  Additional medical history as below. Past Medical History:  Diagnosis Date  . Adrenal adenoma, left 02/08/2016   CT: stable benign  . Anemia in neoplastic disease   . Benign essential HTN   . 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 Cancer of corpus uteri, except isthmus [facility])  oncologist-- dr Denman [name] and dr Alvy Bimler    10-15-2004  dx endometroid endometrial and ovarian cancer s/p  chemotheapy and surgery(TAH w/ BSO) :   recurrent 11-19-2014 post pelvic surgery and radiation 01-29-2015 to 03-10-2015  . Chronic idiopathic neutropenia (HCC)    presumed related to chemotherapy March 2006--- followed by dr Alvy Bimler (treatment w/ G-CSF injections  . Chronic nausea   . Chronic pain    perineal/ anal  area from bladder pad irritates skin , right flank pain  . CKD stage G2/A3, GFR 60-89 and albumin creatinine ratio >300 mg/g    nephrologist-  dr Madelon Lips  . Diabetic retinopathy, background ([place])   . Difficult intravenous access    small veins--- hx PICC lines  . DM type 2 (diabetes mellitus, type 2) ([place])    monitored by dr Legrand Como altheimer  . Dysuria   . Environmental and seasonal allergies   . Fatty liver 02/08/2016   CT  . Generalized muscle weakness   . GERD (gastroesophageal reflux disease)   . Hiatal hernia   . History of abdominal abscess 04/16/2017   post surgery 04-01-2017  --- resolved 10/ 2018  . History of gastric polyp    2014  duodenum  . History of ileus 04/16/2017   resolved w/ no surgical intervention  . History of radiation therapy    01-29-2015 to 03-10-2015  pelvis 50.4Gy  . Hypothyroidism    monitored by dr Legrand Como altheimer  . Ileostomy in place [facility]) 04/01/2017   created at same time colostomy takedown.  . Lower urinary tract symptoms (LUTS)    urge urinary  incontinence  . Mixed dyslipidemia   . Multiple thyroid nodules    Managed by Dr. Harlow Asa  . Pelvic abscess in female 04/16/2017  . PONV (postoperative nausea  and vomiting)    "scopolamine patch works for me"  . Radiation-induced dermatitis    contact dermatitis , radiation completed, rash only on ankles now.  . Seasonal allergies   . Ureteral stricture, right UROLOGIT-  DR [facility]   CHRONIC--  TREATMENT URETERAL STENT  . Vitamin D deficiency   . Wears glasses    Past Surgical History:  Procedure Laterality Date  . APPENDECTOMY    . BREAST RECONSTRUCTION WITH PLACEMENT OF TISSUE EXPANDER AND FLEX HD (ACELLULAR  HYDRATED DERMIS) Bilateral 12/11/2015   Procedure: BILATERAL BREAST RECONSTRUCTION WITH PLACEMENT OF TISSUE EXPANDERS;  Surgeon: Irene Limbo, MD;  Location: [place];  Service: Plastics;  Laterality: Bilateral;  . COLONOSCOPY WITH PROPOFOL N/A 08/21/2013   Procedure: COLONOSCOPY WITH PROPOFOL;  Surgeon: Cleotis Nipper, MD;  [facility];  Service: Endoscopy;  Laterality: N/A;  . COLOSTOMY TAKEDOWN N/A 12/04/2014   Procedure: LAPROSCOPIC LYSIS OF ADHESIONS, SPLENIC MOBILIZATION, RELOCATION OF COLOSTOMY, DEBRIDEMENT INITIAL COLOSTOMY SITE;  Surgeon: Michael [name], MD;  [facility];  Service: General;  Laterality: N/A;  . CYSTOGRAM N/A 06/01/2017   Procedure: CYSTOGRAM;  Surgeon: Alexis Frock, MD;  [facility];  Service: Urology;  Laterality: N/A;  . CYSTOSCOPY W/ RETROGRADES Right 11/21/2015   Procedure: CYSTOSCOPY WITH RETROGRADE PYELOGRAM;  Surgeon: Alexis Frock, MD;  [facility];  Service: Urology;  Laterality: Right;  . CYSTOSCOPY W/ URETERAL STENT PLACEMENT Right 11/21/2015   Procedure: CYSTOSCOPY WITH STENT REPLACEMENT;  Surgeon: Alexis Frock, MD;  [facility];  Service: Urology;  Laterality: Right;  . CYSTOSCOPY W/ URETERAL STENT PLACEMENT Right 03/10/2016   Procedure: CYSTOSCOPY WITH STENT REPLACEMENT;  Surgeon: Alexis Frock, MD;  Location: [facility];  Service: Urology;  Laterality: Right;  . CYSTOSCOPY W/ URETERAL STENT PLACEMENT Right 06/30/2016   Procedure: CYSTOSCOPY WITH RETROGRADE PYELOGRAM/URETERAL STENT EXCHANGE;  Surgeon: Alexis Frock, MD;  Location: [facility];  Service: Urology;  Laterality: Right;  . CYSTOSCOPY W/ URETERAL STENT PLACEMENT N/A 06/01/2017   Procedure: CYSTOSCOPY WITH EXAM UNDER ANESTHESIA;  Surgeon: Alexis Frock, MD;  [facility];  Service: Urology;  Laterality: N/A;  . CYSTOSCOPY W/ URETERAL STENT PLACEMENT Right 08/17/2017   Procedure: CYSTOSCOPY WITH RETROGRADE PYELOGRAM/URETERAL STENT  REMOVAL;  Surgeon: Alexis Frock, MD;  Location[facility];  Service: Urology;  Laterality: Right;  . CYSTOSCOPY WITH RETROGRADE PYELOGRAM, URETEROSCOPY AND STENT PLACEMENT Right 03/20/2015   Procedure: CYSTOSCOPY WITH RETROGRADE PYELOGRAM, URETEROSCOPY WITH BALLOON DILATION AND STENT PLACEMENT ON RIGHT;  Surgeon: Alexis Frock, MD;  Location: [facility];  Service: Urology;  Laterality: Right;  . CYSTOSCOPY WITH RETROGRADE PYELOGRAM, URETEROSCOPY AND STENT PLACEMENT Right 05/02/2015   Procedure: CYSTOSCOPY WITH RIGHT RETROGRADE PYELOGRAM,  DIAGNOSTIC URETEROSCOPY AND STENT PULL ;  Surgeon: Alexis Frock, MD;  Location: [facility];  Service: Urology;  Laterality: Right;  . CYSTOSCOPY WITH RETROGRADE PYELOGRAM, URETEROSCOPY AND STENT PLACEMENT Right 09/05/2015   Procedure: CYSTOSCOPY WITH RETROGRADE PYELOGRAM,  AND STENT PLACEMENT;  Surgeon: Alexis Frock, MD;  [facility];  Service: Urology;  Laterality: Right;  . CYSTOSCOPY WITH RETROGRADE PYELOGRAM, URETEROSCOPY AND STENT PLACEMENT Right 04/01/2017   Procedure: CYSTOSCOPY WITH RETROGRADE PYELOGRAM, URETEROSCOPY AND STENT PLACEMENT;  Surgeon: Alexis Frock, MD;  [facility];  Service: Urology;  Laterality: Right;  . CYSTOSCOPY WITH STENT PLACEMENT Right 10/27/2016   Procedure: CYSTOSCOPY WITH STENT CHANGE and right retrograde pyelogram;  Surgeon: Alexis Frock, MD;  Location: [facility];  Service:  Urology;  Laterality: Right;  . EUS N/A 10/02/2014   Procedure: LOWER ENDOSCOPIC ULTRASOUND (EUS);  Surgeon: Arta Silence, MD;  Location: [facility];  Service: Endoscopy;  Laterality: N/A;  . EXCISION SOFT TISSUE MASS RIGHT FOREMAN  12-08-2006  . EYE SURGERY  as child   pytosis of eyelids repair  . INCISION AND DRAINAGE OF WOUND Bilateral 12/26/2015   Procedure: DEBRIDEMENT OF BILATERAL MASTECTOMY FLAPS;  Surgeon: Irene Limbo, MD;  Location: [place];   Service: Plastics;  Laterality: Bilateral;  . IR CV LINE INJECTION  05/31/2017  . IR FLUORO GUIDE CV LINE LEFT  05/31/2017  . IR FLUORO GUIDE CV LINE RIGHT  04/06/2017  . IR FLUORO GUIDE CV MIDLINE PICC RIGHT  05/30/2017  . IR RADIOLOGIST EVAL & MGMT  05/03/2017  . IR US GUIDE VASC ACCESS LEFT  05/31/2017  . IR US GUIDE VASC ACCESS RIGHT  04/06/2017  . IR US GUIDE VASC ACCESS RIGHT  05/30/2017  . LAPAROSCOPIC CHOLECYSTECTOMY  1990  . LIPOSUCTION WITH LIPOFILLING Bilateral 04/16/2016   Procedure: LIPOSUCTION WITH LIPOFILLING TO BILATERAL CHEST;  Surgeon: Irene Limbo, MD;  Location: [place];  Service: Plastics;  Laterality: Bilateral;  . MASTECTOMY W/ SENTINEL NODE BIOPSY Bilateral 12/11/2015   Procedure: RIGHT PROPHYLACTIC MASTECTOMY, LEFT TOTAL MASTECTOMY WITH LEFT AXILLARY SENTINEL LYMPH NODE BIOPSY;  Surgeon: Stark Klein, MD;  Location: [place];  Service: General;  Laterality: Bilateral;  . OSTOMY N/A 11/19/2014   Procedure: OSTOMY;  Surgeon: Michael [name], MD;  [facility];  Service: General;  Laterality: N/A;  . PROCTOSCOPY N/A 04/01/2017   Procedure: RIDGE PROCTOSCOPY;  Surgeon: Michael [name], MD;  [facility];  Service: General;  Laterality: N/A;  . REMOVAL OF BILATERAL TISSUE EXPANDERS WITH PLACEMENT OF BILATERAL BREAST IMPLANTS Bilateral 04/16/2016   Procedure: REMOVAL OF BILATERAL TISSUE EXPANDERS WITH PLACEMENT OF BILATERAL BREAST IMPLANTS;  Surgeon: Irene Limbo, MD;  Location: [place];  Service: Plastics;  Laterality: Bilateral;  . ROBOTIC ASSISTED LAP VAGINAL HYSTERECTOMY N/A 11/19/2014   Procedure: ROBOTIC LYSIS OF ADHESIONS, CONVERTED TO LAPAROTOMY RADICAL UPPER VAGINECTOMY,LOW ANTERIOR BOWEL RESECTION, COLOSTOMY, BILATERAL URETERAL STENT PLACEMENT AND CYSTONOMY CLOSURE;  Surgeon: Everitt Amber, MD;  [facility];  Service: Gynecology;  Laterality: N/A;  . TISSUE EXPANDER FILLING Bilateral 12/26/2015   Procedure: EXPANSION OF BILATERAL  CHEST TISSUE EXPANDERS (60 mL- Right; 75 mL- Left);  Surgeon: Irene Limbo, MD;  Location: [place];  Service: Plastics;  Laterality: Bilateral;  . TONSILLECTOMY    . TOTAL ABDOMINAL HYSTERECTOMY  March 2006   [facility]   and Bilateral Salpingoophorectomy/  staging for Ovarian cancer/  an  . XI ROBOTIC ASSISTED LOWER ANTERIOR RESECTION N/A 04/01/2017   Procedure: XI ROBOTIC VS LAPAROSCOPIC COLOSTOMY TAKEDOWN WITH LYSIS OF ADHESIONS.;  Surgeon: Michael [name], MD;  [facility];  Service: General;  Laterality: N/A;  ERAS PATHWAY    Allergies: Penicillins; Ultram [tramadol]; Adhesive [tape]; Cefaclor; Erythromycin; Trimethoprim; Ciprofloxacin; Fluconazole; Oxycodone; Pectin; and Sulfa antibiotics  Medications: Prior to Admission medications   Medication Sig Start Date End Date Taking? Authorizing Provider  anastrozole (ARIMIDEX) 1 MG tablet TAKE 1 TABLET DAILY 12/08/16  Yes Gorsuch, Ni, MD  Biotin 5 MG TABS Take 5 mg by mouth every morning.    Yes [provider]  Cholecalciferol (VITAMIN D3) 10000 UNITS capsule Take 10,000 Units by mouth once a week. Sunday evening's   Yes [provider]  diphenoxylate-atropine (LOMOTIL) 2.5-0.025 MG tablet Take 1-2  tablets by mouth every 8 (eight) hours. TO PREVENT LOOSE BOWEL MOVEMENTS 05/20/17  Yes [provider]  Famotidine-Ca Carb-Mag Hydrox (PEPCID COMPLETE PO) Take 1 tablet by mouth daily as needed (INDIGESTION).   Yes [provider]  filgrastim (NEUPOGEN) 480 MCG/1.6ML injection Inject 1.6 ml under the skin every 5 days for life Patient taking differently: Inject 480 mcg into the skin See admin instructions. Inject 1.6 ml under the skin every 6 days for life 01/06/17  Yes Gorsuch, Ni, MD  levothyroxine (SYNTHROID, LEVOTHROID) 150 MCG tablet Take 150 mcg by mouth daily before breakfast.    Yes [provider]  loratadine (CLARITIN) 10 MG tablet Take 10 mg by mouth every morning.    Yes  [provider]  omega-3 acid ethyl esters (LOVAZA) 1 G capsule Take 1 g by mouth 2 (two) times daily.    Yes [provider]  ondansetron (ZOFRAN-ODT) 4 MG disintegrating tablet Take 4 mg by mouth every 6 (six) hours as needed for nausea or vomiting.  04/12/17  Yes [provider]  Polyethyl Glycol-Propyl Glycol (SYSTANE OP) Place 1 drop into both eyes at bedtime.    Yes [provider]  Prenatal Vit-Fe Fumarate-FA (PRENATAL VITAMIN PO) Take 1 capsule by mouth daily. Takes prenatal because there are no dyes in it   Yes [provider]  prochlorperazine (COMPAZINE) 10 MG tablet Take 1 tablet (10 mg total) by mouth 2 (two) times daily as needed for nausea or vomiting. Patient taking differently: Take 10 mg by mouth 2 (two) times daily as needed for nausea or vomiting.  05/28/17  Yes Virgel Manifold, MD  promethazine (PHENERGAN) 25 MG tablet Take 1 tablet (25 mg total) by mouth every 6 (six) hours as needed for nausea or vomiting. 05/28/17  Yes Virgel Manifold, MD  ranitidine (ZANTAC) 150 MG tablet Take 150 mg by mouth 2 (two) times daily as needed for heartburn.    Yes [provider]  rosuvastatin (CRESTOR) 10 MG tablet Take 10 mg by mouth every evening.    Yes [provider]  silver sulfADIAZINE (SILVADENE) 1 % cream Apply 1 application topically daily. 08/10/17  Yes Shelda Pal, DO  sitaGLIPtin (JANUVIA) 100 MG tablet Take 100 mg by mouth every morning.    Yes [provider]  HYDROcodone-acetaminophen (NORCO/VICODIN) 5-325 MG tablet Take 1 tablet by mouth every 6 (six) hours as needed (Pain). Patient not taking: Reported on 08/29/2017 08/10/17   Shelda Pal, DO     Vital Signs: BP 124/60 (BP Location: Right Leg)   Pulse 99   Temp 98.2 F (36.8 C) (Oral)   Resp 18   Ht 5' 3"  (1.6 m)   Wt 182 lb 12.2 oz (82.9 kg)   SpO2 99%   BMI 32.37 kg/m   Physical Exam awake, alert.  Chest with few basilar  crackles on right, left clear.  Heart with regular rate and rhythm.  Abdomen soft, positive bowel sounds, mildly tender right lower quadrant, intact ileostomy bag.  Lower extremities with 1+ edema  Imaging: Ct Abdomen Pelvis Wo Contrast  Result Date: 08/31/2017 CLINICAL DATA:  62 year old female status post bladder reconstruction in August 2018 for urinary leak. History of ovarian and uterine cancer in 2006 with recurrence in 2016 treated with chemotherapy and radiation therapy. EXAM: CT ABDOMEN AND PELVIS WITHOUT CONTRAST TECHNIQUE: Multidetector CT imaging of the abdomen and pelvis was performed following the standard protocol without IV contrast. COMPARISON:  CT the abdomen and pelvis  08/25/2017. FINDINGS: Lower chest: Bilateral breast implants incidentally noted. Otherwise, unremarkable. Hepatobiliary: Mild diffuse low attenuation throughout the hepatic parenchyma indicative of a background of mild hepatic steatosis. Calcified granuloma in the right lobe of the liver. No definite suspicious cystic or solid hepatic lesions are noted on today's noncontrast CT examination. Status post cholecystectomy. Pancreas: No pancreatic mass or peripancreatic inflammatory changes are noted on today's noncontrast CT examination. Spleen: Unremarkable. Adrenals/Urinary Tract: Moderate atrophy of the right kidney. Mild bilateral hydroureteronephrosis. Reflux of iodinated contrast material into the right renal collecting system. Nonobstructive calculi in the lower pole collecting system of left kidney, similar to prior study from 08/25/2017, measuring up to 11 mm. Urinary bladder is partially decompressed around an indwelling Foley catheter. 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 Bilateral adrenal glands are normal in  appearance. Stomach/Bowel: Normal appearance of the stomach. No pathologic dilatation of small bowel or colon. Postoperative changes of partial colectomy and small bowel resections are noted, with right upper quadrant ostomy. Adjacent to the rectovesical fistula there is a short extension of contrast which extends posteriorly into the presacral space and extends slightly cephalad, presumably a fistulous tract (best demonstrated on axial image 71 of series 2, sagittal images 50-58 of series 5 and coronal images 57-64 of series 4. Vascular/Lymphatic: No atherosclerotic calcifications in the abdominal or pelvic vasculature. No lymphadenopathy noted in the abdomen or pelvis. Reproductive: Status post total abdominal hysterectomy and bilateral salpingo-oophorectomy. Other: Trace volume of ascites in the low anatomic pelvis. No pneumoperitoneum. Musculoskeletal: There are no aggressive appearing lytic or blastic lesions noted in the visualized portions of the skeleton. IMPRESSION: 1. Rectovesical fistula, as above. 2. 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 3. Reflux of contrast material into the right kidney via the right ureter with moderate atrophy of the right kidney. Nonobstructive calculi in the lower pole collecting system of the left kidney measuring up to 11 mm. 4. Additional incidental findings, as above. These results will be called to the ordering clinician or representative by the Radiologist Assistant, and communication documented in the PACS or zVision Dashboard. Electronically Signed   By: Vinnie Langton M.D.   On: 08/31/2017 14:34   Dg Chest 2 View  Result Date: 08/31/2017 CLINICAL DATA:  Shortness of breath. EXAM: CHEST  2 VIEW COMPARISON:  Radiographs of August 30, 2017. FINDINGS: The heart size and mediastinal contours are within normal limits. Both lungs are clear. No  pneumothorax or pleural effusion is noted. Right-sided PICC line is unchanged in position. The visualized skeletal structures are unremarkable. IMPRESSION: No active cardiopulmonary disease. Electronically Signed   By: Marijo Conception, M.D.   On: 08/31/2017 10:06   US Renal  Result Date: 08/30/2017 CLINICAL DATA:  62 year old female with history of acute renal failure. History of ureteral stricture. EXAM: RENAL / URINARY TRACT ULTRASOUND COMPLETE COMPARISON:  No prior renal ultrasound. CT the abdomen and pelvis 08/25/2017. FINDINGS: Right Kidney: Length: 10 cm. Echogenicity within normal limits. No mass or hydronephrosis visualized. 7 mm echogenic focus in the interpolar region of the right kidney with some mild posterior acoustic shadowing, compatible with a calculus. Left Kidney: Length: 14.9 cm. Echogenicity within normal limits. Mild to moderate hydronephrosis. No mass visualized.  Echogenic focus measuring 6 mm in the lower pole with some posterior acoustic shadowing, compatible with a calculus. Bladder: Under distended and poorly visualized. IMPRESSION: 1. Small nonobstructive calculi in the collecting systems of both kidneys. 2. Mild to moderate hydronephrosis. Electronically Signed   By: Vinnie Langton M.D.   On: 08/30/2017 09:42   Dg Abd Acute W/chest  Result Date: 08/30/2017 CLINICAL DATA:  Shortness of breath on exertion. EXAM: DG ABDOMEN ACUTE W/ 1V CHEST COMPARISON:  None. FINDINGS: There is no evidence of dilated bowel loops or free intraperitoneal air. Status post cholecystectomy. Surgical clips are seen in the pelvis. Heart size and mediastinal contours are within normal limits. Both lungs are clear. IMPRESSION: No evidence of bowel obstruction or ileus. No acute cardiopulmonary disease. Electronically Signed   By: Marijo Conception, M.D.   On: 08/30/2017 11:26    Labs:  CBC: Recent Labs    08/30/17 0623 08/31/17 0451 09/01/17 0406 09/02/17 0334  WBC 2.8* 1.7* 4.3 5.7  HGB 9.4*  7.9* 8.4* 8.1*  HCT 26.8* 22.5* 24.5* 23.3*  PLT 222 204 200 184    COAGS: Recent Labs    04/16/17 0047 09/02/17 0939  INR 1.21 1.21    BMP: Recent Labs    08/30/17 0623 08/31/17 0451 09/01/17 0406 09/02/17 0334  NA 130* 128* 126* 125*  K 3.0* 3.6 3.0* 3.3*  CL 107 105 98* 94*  CO2 10* 15* 19* 22  GLUCOSE 182* 221* 306* 357*  BUN 81* 69* 61* 59*  CALCIUM 8.8* 8.1* 7.6* 7.6*  CREATININE 4.24* 3.60* 3.46* 3.61*  GFRNONAA 10* 12* 13* 12*  GFRAA 12* 14* 15* 14*    LIVER FUNCTION TESTS: Recent Labs    04/20/17 0510 04/21/17 0416 05/27/17 1724 08/29/17 1550 08/31/17 0451 09/01/17 0406 09/02/17 0334  BILITOT 0.4 0.5 0.3 0.5  --   --   --   AST 13* 13* 24 16  --   --   --   ALT 10* 10* 33 24  --   --   --   ALKPHOS 177* 190* 166* 173*  --   --   --   PROT 5.8* 5.9* 9.2* 6.8  --   --   --   ALBUMIN 1.8* 1.8* 3.9 2.1* 1.7* 1.6* 1.4*    Assessment and Plan: Pt with history of breast cancer as well as recurrent endometrial cancer with prior low anterior resection, partial vaginectomy, and colostomy in April 2016.  She underwent colostomy takedown with loop ileostomy diversion in August 2018.  She has also had robotic right ureteral reimplant/double-J stent with psoas bladder hitch in 2018 with stent removal on 08/17/17.  She is currently leaking fluid from her rectum despite Foley decompression of the bladder.  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  There is reflux of contrast into the right kidney via the right ureter with moderate atrophy of the right kidney and nonobstructive calculi in the lower pole of the left kidney.  Request now received from urology for bilateral percutaneous nephrostomies to divert urine away from fistula.  She is currently afebrile.  Current labs include WBC 5.7, hemoglobin 8.1, platelets 184k,  creatinine 3.61, PT 15.2, INR 1.2. Last dose of lovenox was at 2200 1/31. Imaging studies were reviewed by Dr. Earleen Newport. Risks and benefits of procedure were discussed with the patient including, but not limited to, infection, bleeding,  significant bleeding causing loss or decrease in renal function or damage to adjacent structures.   All of the patient's questions were answered, patient is agreeable to proceed.  Consent signed and in chart.  Procedure planned for later today.    Electronically Signed: D. Rowe Robert, PA-C 09/02/2017, 12:08 PM   I spent a total of 30 minutes at the the patient's bedside AND on the patient's hospital floor or unit, greater than 50% of which was counseling/coordinating care for bilateral percutaneous nephrostomies    Patient ID: [name], female   DOB: 1956-03-21, 62 y.o.   MRN: [id number]

## 2017-09-03 LAB — CBC WITH DIFFERENTIAL/PLATELET
BASOS PCT: 0 %
Basophils Absolute: 0 10*3/uL (ref 0.0–0.1)
EOS ABS: 0.1 10*3/uL (ref 0.0–0.7)
EOS PCT: 3 %
HCT: 24.9 % — ABNORMAL LOW (ref 36.0–46.0)
HEMOGLOBIN: 8.2 g/dL — AB (ref 12.0–15.0)
LYMPHS ABS: 0.3 10*3/uL — AB (ref 0.7–4.0)
Lymphocytes Relative: 7 %
MCH: 29.3 pg (ref 26.0–34.0)
MCHC: 32.9 g/dL (ref 30.0–36.0)
MCV: 88.9 fL (ref 78.0–100.0)
Monocytes Absolute: 0.6 10*3/uL (ref 0.1–1.0)
Monocytes Relative: 14 %
Neutro Abs: 3.3 10*3/uL (ref 1.7–7.7)
Neutrophils Relative %: 76 %
PLATELETS: 221 10*3/uL (ref 150–400)
RBC: 2.8 MIL/uL — AB (ref 3.87–5.11)
RDW: 15.1 % (ref 11.5–15.5)
WBC: 4.4 10*3/uL (ref 4.0–10.5)

## 2017-09-03 LAB — URINE CULTURE: Culture: >=100000 — AB

## 2017-09-03 LAB — GLUCOSE, CAPILLARY
GLUCOSE-CAPILLARY: 142 mg/dL — AB (ref 65–99)
GLUCOSE-CAPILLARY: 162 mg/dL — AB (ref 65–99)
Glucose-Capillary: 304 mg/dL — ABNORMAL HIGH (ref 65–99)
Glucose-Capillary: 215 mg/dL — ABNORMAL HIGH (ref 65–99)

## 2017-09-03 LAB — RENAL FUNCTION PANEL
ALBUMIN: 1.9 g/dL — AB (ref 3.5–5.0)
Anion gap: 10 (ref 5–15)
BUN: 56 mg/dL — ABNORMAL HIGH (ref 6–20)
CALCIUM: 8.5 mg/dL — AB (ref 8.9–10.3)
CHLORIDE: 99 mmol/L — AB (ref 101–111)
CO2: 23 mmol/L (ref 22–32)
CREATININE: 3.59 mg/dL — AB (ref 0.44–1.00)
GFR calc Af Amer: 15 mL/min — ABNORMAL LOW (ref >60)
GFR, EST NON AFRICAN AMERICAN: 13 mL/min — AB (ref >60)
Glucose, Bld: 187 mg/dL — ABNORMAL HIGH (ref 65–99)
PHOSPHORUS: 3.8 mg/dL (ref 2.5–4.6)
Potassium: 3.6 mmol/L (ref 3.5–5.1)
SODIUM: 132 mmol/L — AB (ref 135–145)

## 2017-09-03 LAB — MAGNESIUM: MAGNESIUM: 1.9 mg/dL (ref 1.7–2.4)

## 2017-09-03 MED ORDER — ACETAMINOPHEN 325 MG PO TABS: 650.0000 mg | ORAL_TABLET | Freq: Four times a day (QID) | ORAL | Status: DC | PRN

## 2017-09-03 MED ORDER — TBO-FILGRASTIM 480 MCG/0.8ML ~~LOC~~ SOSY
480.0000 ug | PREFILLED_SYRINGE | SUBCUTANEOUS | Status: DC
  Administered 2017-09-03: 480 ug via SUBCUTANEOUS
  Filled 2017-09-03: qty 0.8

## 2017-09-03 NOTE — Progress Notes (Signed)
Verbal order from Dr. Margarito Courser to hold Lasix for tonight. We will continue to monitor

## 2017-09-03 NOTE — Progress Notes (Signed)
Subjective: CC: Flank pain.  Hx:  [name] is weepy this morning complaining of bilateral flank pain, left > right, following bilateral nephrostomy tube placement yesterday.   She reports that she continues to have rectal leakage despite the NT's and her foley output remain significant.  Tmax is 102.1 but now afebrile.   ROS:  Review of Systems  Genitourinary: Positive for flank pain.  All other systems reviewed and are negative.   Allergies  Allergen Reactions  . Penicillins Swelling    Facial swelling/childhood allergy Has patient had a PCN reaction causing immediate rash, facial/tongue/throat swelling, SOB or lightheadedness with hypotension: Yes Has patient had a PCN reaction causing severe rash involving mucus membranes or skin necrosis: Yes Has patient had a PCN reaction that required hospitalization yes Has patient had a PCN reaction occurring within the last 10 years: No If all of the above answers are "NO", then may proceed with Cephalosporin use.   [name] Kitchen Ultram [Tramadol] Hives  . Adhesive [Tape]     blisters  . Cefaclor Rash    Ceclor  . Erythromycin     Gastritis, abd cramps  . Trimethoprim Rash  . Ciprofloxacin Other (See Comments)    Unknown On Dr notes   . Fluconazole Rash  . Oxycodone     " I just feel weird"  . Pectin Rash    Pectin ring for stoma  . Sulfa Antibiotics Rash    Past Medical History:  Diagnosis Date  . Adrenal adenoma, left 02/08/2016   CT: stable benign  . Anemia in neoplastic disease   . Benign essential HTN   . 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 Cancer of corpus uteri, except isthmus [facility])  oncologist-- dr Denman [name] and dr Alvy Bimler    10-15-2004  dx endometroid endometrial and ovarian cancer s/p  chemotheapy and surgery(TAH w/ BSO) :  recurrent  11-19-2014 post pelvic surgery and radiation 01-29-2015 to 03-10-2015  . Chronic idiopathic neutropenia (HCC)    presumed related to chemotherapy March 2006--- followed by dr Alvy Bimler (treatment w/ G-CSF injections  . Chronic nausea   . Chronic pain    perineal/ anal  area from bladder pad irritates skin , right flank pain  . CKD stage G2/A3, GFR 60-89 and albumin creatinine ratio >300 mg/g    nephrologist-  dr Madelon Lips  . Diabetic retinopathy, background ([place])   . Difficult intravenous access    small veins--- hx PICC lines  . DM type 2 (diabetes mellitus, type 2) ([place])    monitored by dr Legrand Como altheimer  . Dysuria   . Environmental and seasonal allergies   . Fatty liver 02/08/2016   CT  . Generalized muscle weakness   . GERD (gastroesophageal reflux disease)   . Hiatal hernia   . History of abdominal abscess 04/16/2017   post surgery 04-01-2017  --- resolved 10/ 2018  . History of gastric polyp    2014  duodenum  . History of ileus 04/16/2017   resolved w/ no surgical intervention  . History of radiation therapy    01-29-2015 to 03-10-2015  pelvis 50.4Gy  . Hypothyroidism    monitored by dr Legrand Como altheimer  . Ileostomy in place [facility]) 04/01/2017   created at same time colostomy takedown.  . Lower urinary tract symptoms (LUTS)    urge  urinary  incontinence  . Mixed dyslipidemia   . Multiple thyroid nodules    Managed by Dr. Harlow Asa  . Pelvic abscess in female 04/16/2017  . PONV (postoperative nausea and vomiting)    "scopolamine patch works for me"  . Radiation-induced dermatitis    contact dermatitis , radiation completed, rash only on ankles now.  . Seasonal allergies   . Ureteral stricture, right UROLOGIT-  DR [facility]   CHRONIC--  TREATMENT URETERAL STENT  . Vitamin D deficiency   . Wears glasses     Past Surgical History:  Procedure Laterality Date  . APPENDECTOMY    . BREAST RECONSTRUCTION WITH PLACEMENT OF TISSUE EXPANDER AND FLEX HD (ACELLULAR HYDRATED  DERMIS) Bilateral 12/11/2015   Procedure: BILATERAL BREAST RECONSTRUCTION WITH PLACEMENT OF TISSUE EXPANDERS;  Surgeon: Irene Limbo, MD;  Location: [place];  Service: Plastics;  Laterality: Bilateral;  . COLONOSCOPY WITH PROPOFOL N/A 08/21/2013   Procedure: COLONOSCOPY WITH PROPOFOL;  Surgeon: Cleotis Nipper, MD;  [facility];  Service: Endoscopy;  Laterality: N/A;  . COLOSTOMY TAKEDOWN N/A 12/04/2014   Procedure: LAPROSCOPIC LYSIS OF ADHESIONS, SPLENIC MOBILIZATION, RELOCATION OF COLOSTOMY, DEBRIDEMENT INITIAL COLOSTOMY SITE;  Surgeon: Michael [name], MD;  [facility];  Service: General;  Laterality: N/A;  . CYSTOGRAM N/A 06/01/2017   Procedure: CYSTOGRAM;  Surgeon: Alexis Frock, MD;  [facility];  Service: Urology;  Laterality: N/A;  . CYSTOSCOPY W/ RETROGRADES Right 11/21/2015   Procedure: CYSTOSCOPY WITH RETROGRADE PYELOGRAM;  Surgeon: Alexis Frock, MD;  [facility];  Service: Urology;  Laterality: Right;  . CYSTOSCOPY W/ URETERAL STENT PLACEMENT Right 11/21/2015   Procedure: CYSTOSCOPY WITH STENT REPLACEMENT;  Surgeon: Alexis Frock, MD;  [facility];  Service: Urology;  Laterality: Right;  . CYSTOSCOPY W/ URETERAL STENT PLACEMENT Right 03/10/2016   Procedure: CYSTOSCOPY WITH STENT REPLACEMENT;  Surgeon: Alexis Frock, MD;  Location: [facility];  Service: Urology;  Laterality: Right;  . CYSTOSCOPY W/ URETERAL STENT PLACEMENT Right 06/30/2016   Procedure: CYSTOSCOPY WITH RETROGRADE PYELOGRAM/URETERAL STENT EXCHANGE;  Surgeon: Alexis Frock, MD;  Location: [facility];  Service: Urology;  Laterality: Right;  . CYSTOSCOPY W/ URETERAL STENT PLACEMENT N/A 06/01/2017   Procedure: CYSTOSCOPY WITH EXAM UNDER ANESTHESIA;  Surgeon: Alexis Frock, MD;  [facility];  Service: Urology;  Laterality: N/A;  . CYSTOSCOPY W/ URETERAL STENT PLACEMENT Right 08/17/2017   Procedure: CYSTOSCOPY WITH RETROGRADE PYELOGRAM/URETERAL STENT REMOVAL;   Surgeon: Alexis Frock, MD;  Location[facility];  Service: Urology;  Laterality: Right;  . CYSTOSCOPY WITH RETROGRADE PYELOGRAM, URETEROSCOPY AND STENT PLACEMENT Right 03/20/2015   Procedure: CYSTOSCOPY WITH RETROGRADE PYELOGRAM, URETEROSCOPY WITH BALLOON DILATION AND STENT PLACEMENT ON RIGHT;  Surgeon: Alexis Frock, MD;  Location: [facility];  Service: Urology;  Laterality: Right;  . CYSTOSCOPY WITH RETROGRADE PYELOGRAM, URETEROSCOPY AND STENT PLACEMENT Right 05/02/2015   Procedure: CYSTOSCOPY WITH RIGHT RETROGRADE PYELOGRAM,  DIAGNOSTIC URETEROSCOPY AND STENT PULL ;  Surgeon: Alexis Frock, MD;  Location: [facility];  Service: Urology;  Laterality: Right;  . CYSTOSCOPY WITH RETROGRADE PYELOGRAM, URETEROSCOPY AND STENT PLACEMENT Right 09/05/2015   Procedure: CYSTOSCOPY WITH RETROGRADE PYELOGRAM,  AND STENT PLACEMENT;  Surgeon: Alexis Frock, MD;  [facility];  Service: Urology;  Laterality: Right;  . CYSTOSCOPY WITH RETROGRADE PYELOGRAM, URETEROSCOPY AND STENT PLACEMENT Right 04/01/2017   Procedure: CYSTOSCOPY WITH RETROGRADE PYELOGRAM, URETEROSCOPY AND STENT PLACEMENT;  Surgeon: Alexis Frock, MD;  [facility];  Service: Urology;  Laterality:  Right;  [name] Kitchen CYSTOSCOPY WITH STENT PLACEMENT Right 10/27/2016   Procedure: CYSTOSCOPY WITH STENT CHANGE and right retrograde pyelogram;  Surgeon: Alexis Frock, MD;  Location: [facility];  Service: Urology;  Laterality: Right;  . EUS N/A 10/02/2014   Procedure: LOWER ENDOSCOPIC ULTRASOUND (EUS);  Surgeon: Arta Silence, MD;  Location: [facility];  Service: Endoscopy;  Laterality: N/A;  . EXCISION SOFT TISSUE MASS RIGHT FOREMAN  12-08-2006  . EYE SURGERY  as child   pytosis of eyelids repair  . INCISION AND DRAINAGE OF WOUND Bilateral 12/26/2015   Procedure: DEBRIDEMENT OF BILATERAL MASTECTOMY FLAPS;  Surgeon: Irene Limbo, MD;  Location: [place];  Service:  Plastics;  Laterality: Bilateral;  . IR CV LINE INJECTION  05/31/2017  . IR FLUORO GUIDE CV LINE LEFT  05/31/2017  . IR FLUORO GUIDE CV LINE RIGHT  04/06/2017  . IR FLUORO GUIDE CV MIDLINE PICC RIGHT  05/30/2017  . IR NEPHROSTOGRAM LEFT INITIAL PLACEMENT  09/02/2017  . IR NEPHROSTOGRAM RIGHT INITIAL PLACEMENT  09/02/2017  . IR RADIOLOGIST EVAL & MGMT  05/03/2017  . IR US GUIDE VASC ACCESS LEFT  05/31/2017  . IR US GUIDE VASC ACCESS RIGHT  04/06/2017  . IR US GUIDE VASC ACCESS RIGHT  05/30/2017  . LAPAROSCOPIC CHOLECYSTECTOMY  1990  . LIPOSUCTION WITH LIPOFILLING Bilateral 04/16/2016   Procedure: LIPOSUCTION WITH LIPOFILLING TO BILATERAL CHEST;  Surgeon: Irene Limbo, MD;  Location: [place];  Service: Plastics;  Laterality: Bilateral;  . MASTECTOMY W/ SENTINEL NODE BIOPSY Bilateral 12/11/2015   Procedure: RIGHT PROPHYLACTIC MASTECTOMY, LEFT TOTAL MASTECTOMY WITH LEFT AXILLARY SENTINEL LYMPH NODE BIOPSY;  Surgeon: Stark Klein, MD;  Location: [place];  Service: General;  Laterality: Bilateral;  . OSTOMY N/A 11/19/2014   Procedure: OSTOMY;  Surgeon: Michael [name], MD;  [facility];  Service: General;  Laterality: N/A;  . PROCTOSCOPY N/A 04/01/2017   Procedure: RIDGE PROCTOSCOPY;  Surgeon: Michael [name], MD;  [facility];  Service: General;  Laterality: N/A;  . REMOVAL OF BILATERAL TISSUE EXPANDERS WITH PLACEMENT OF BILATERAL BREAST IMPLANTS Bilateral 04/16/2016   Procedure: REMOVAL OF BILATERAL TISSUE EXPANDERS WITH PLACEMENT OF BILATERAL BREAST IMPLANTS;  Surgeon: Irene Limbo, MD;  Location: [place];  Service: Plastics;  Laterality: Bilateral;  . ROBOTIC ASSISTED LAP VAGINAL HYSTERECTOMY N/A 11/19/2014   Procedure: ROBOTIC LYSIS OF ADHESIONS, CONVERTED TO LAPAROTOMY RADICAL UPPER VAGINECTOMY,LOW ANTERIOR BOWEL RESECTION, COLOSTOMY, BILATERAL URETERAL STENT PLACEMENT AND CYSTONOMY CLOSURE;  Surgeon: Everitt Amber, MD;  [facility];  Service: Gynecology;   Laterality: N/A;  . TISSUE EXPANDER FILLING Bilateral 12/26/2015   Procedure: EXPANSION OF BILATERAL CHEST TISSUE EXPANDERS (60 mL- Right; 75 mL- Left);  Surgeon: Irene Limbo, MD;  Location: [place];  Service: Plastics;  Laterality: Bilateral;  . TONSILLECTOMY    . TOTAL ABDOMINAL HYSTERECTOMY  March 2006   [facility]   and Bilateral Salpingoophorectomy/  staging for Ovarian cancer/  an  . XI ROBOTIC ASSISTED LOWER ANTERIOR RESECTION N/A 04/01/2017   Procedure: XI ROBOTIC VS LAPAROSCOPIC COLOSTOMY TAKEDOWN WITH LYSIS OF ADHESIONS.;  Surgeon: Michael [name], MD;  [facility];  Service: General;  Laterality: N/A;  ERAS PATHWAY    Social History   Socioeconomic History  . Marital status: Married    Spouse name: Not on file  . Number of children: 1  . Years of education: Not on file  . Highest education level: Not on file  Social Needs  . Emergency planning/management officer  strain: Not on file  . Food insecurity - worry: Not on file  . Food insecurity - inability: Not on file  . Transportation needs - medical: Not on file  . Transportation needs - non-medical: Not on file  Occupational History  . 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 Smoking status: Never Smoker  . Smokeless tobacco: Never Used  Substance and Sexual Activity  . Alcohol use: Yes    Comment: rare social  . Drug use: No  . Sexual activity: Not on file  Other Topics Concern  . Not on file  Social History Narrative   Exercise-- has not gotten back into it since cancer came back    Family History  Problem Relation Age of Onset  . Cancer Mother 3       stomach ca  . Hypertension Mother   . Cancer Father 40       prostate ca  . Diabetes Father   . Heart disease Father        CABG  . Breast cancer Maternal Aunt        dx in her 41s  . Lymphoma Paternal Aunt   . Brain cancer Paternal Grandfather   . Ovarian cancer Other   . Diabetes Sister   . Hypertension  Brother y-10  . Heart disease Brother        CABG  . Diabetes Brother     Anti-infectives: Anti-infectives (From admission, onward)   Start     Dose/Rate Route Frequency Ordered Stop   08/30/17 1000  ciprofloxacin (CIPRO) IVPB 200 mg     200 mg 100 mL/hr over 60 Minutes Intravenous Every 24 hours 08/30/17 0855        Current Facility-Administered Medications  Medication Dose Route Frequency Provider Last Rate Last Dose  . 0.9 %  sodium chloride infusion   Intravenous Once Patrecia Pour, MD      . acetaminophen (TYLENOL) suppository 650 mg  650 mg Rectal Q6H PRN Roney Jaffe, MD   650 mg at 09/02/17 1523  . acetaminophen (TYLENOL) tablet 1,000 mg  1,000 mg Oral TID Michael [name], MD   1,000 mg at 09/02/17 2159  . alum & mag hydroxide-simeth (MAALOX/MYLANTA) 200-200-20 MG/5ML suspension 30 mL  30 mL Oral Q6H PRN Nyoka Cowden, Terri L, RPH      . anastrozole (ARIMIDEX) tablet 1 mg  1 mg Oral Daily Michael [name], MD   1 mg at 09/02/17 1026  . [START ON 09/04/2017] cholecalciferol (VITAMIN D) tablet 10,000 Units  10,000 Units Oral Weekly Michael [name], MD      . ciprofloxacin (CIPRO) IVPB 200 mg  200 mg Intravenous Q24H Minda Ditto, RPH   Stopped at 09/02/17 1118  . diphenoxylate-atropine (LOMOTIL) 2.5-0.025 MG per tablet 1-2 tablet  1-2 tablet Oral Tor Netters, MD   1 tablet at 09/02/17 1338  . enoxaparin (LOVENOX) injection 30 mg  30 mg Subcutaneous Q24H Allred, Darrell K, PA-C   30 mg at 09/02/17 2158  . famotidine (PEPCID) tablet 20 mg  20 mg Oral Daily Michael [name], MD   20 mg at 09/01/17 1102  . furosemide (LASIX) injection 80 mg  80 mg Intravenous Once [name], MD      . guaiFENesin-dextromethorphan [facility] DM) 100-10 MG/5ML syrup 10 mL  10 mL Oral Q4H PRN Michael [name], MD   10 mL at 09/01/17 2135  . hydrocortisone (ANUSOL-HC) 2.5 % rectal cream  1 application  1 application Topical QID PRN Michael [name], MD      . hydrocortisone cream 1 % 1 application  1 application  Topical TID PRN Michael [name], MD      . HYDROmorphone (DILAUDID) injection 0.5-1 mg  0.5-1 mg Intravenous Q3H PRN Roney Jaffe, MD   0.5 mg at 09/03/17 0231  . insulin aspart (novoLOG) injection 0-20 Units  0-20 Units Subcutaneous TID WC Patrecia Pour, MD   4 Units at 09/02/17 1941  . insulin aspart (novoLOG) injection 0-5 Units  0-5 Units Subcutaneous QHS Patrecia Pour, MD   2 Units at 09/02/17 2211  . insulin glargine (LANTUS) injection 20 Units  20 Units Subcutaneous BID Michael [name], MD   20 Units at 09/02/17 2159  . iopamidol (ISOVUE-370) 76 % injection 50 mL  50 mL Other Once PRN Corrie Mckusick, DO      . levothyroxine (SYNTHROID, LEVOTHROID) tablet 150 mcg  150 mcg Oral QAC breakfast Michael [name], MD   150 mcg at 09/02/17 0800  . lip balm (CARMEX) ointment 1 application  1 application Topical BID Michael [name], MD   1 application at 02/40/97 2159  . loperamide (IMODIUM) capsule 2-4 mg  2-4 mg Oral Q8H PRN Michael [name], MD      . magic mouthwash  15 mL Oral QID PRN Michael [name], MD      . magnesium sulfate IVPB 2 g 50 mL  2 g Intravenous Once [name], MD   Stopped at 09/02/17 1945  . menthol-cetylpyridinium (CEPACOL) lozenge 3 mg  1 lozenge Oral PRN Michael [name], MD      . metoCLOPramide (REGLAN) tablet 5 mg  5 mg Oral TID Maebelle Munroe, MD   5 mg at 09/01/17 1759  . phenol (CHLORASEPTIC) mouth spray 1-2 spray  1-2 spray Mouth/Throat PRN Michael [name], MD      . polyvinyl alcohol (LIQUIFILM TEARS) 1.4 % ophthalmic solution 1 drop  1 drop Both Eyes Ardeen Fillers, MD   1 drop at 09/02/17 2200  . prenatal multivitamin tablet 1 tablet  1 tablet Oral Daily Michael [name], MD   1 tablet at 09/01/17 1102  . prochlorperazine (COMPAZINE) injection 10 mg  10 mg Intravenous Q4H PRN Barton Dubois, MD   10 mg at 09/01/17 1119  . promethazine (PHENERGAN) injection 25 mg  25 mg Intravenous Q6H PRN Barton Dubois, MD      . protein supplement (PREMIER PROTEIN) liquid  11 oz Oral BID  BM Michael [name], MD   11 oz at 09/01/17 2144  . psyllium (HYDROCIL/METAMUCIL) packet 1 packet  1 packet Oral Daily Michael [name], MD   1 packet at 09/01/17 1104  . rosuvastatin (CRESTOR) tablet 10 mg  10 mg Oral QPM Michael [name], MD   10 mg at 09/01/17 2142  . simethicone (MYLICON) chewable tablet 40 mg  40 mg Oral Q6H PRN Michael [name], MD      . sodium chloride flush (NS) 0.9 % injection 10-40 mL  10-40 mL Intracatheter PRN Michael [name], MD   10 mL at 09/03/17 0459  . [START ON 09/05/2017] Tbo-Filgrastim (GRANIX) injection 480 mcg  480 mcg Subcutaneous Weekly Michael [name], MD       Facility-Administered Medications Ordered in Other Encounters  Medication Dose Route Frequency Provider Last Rate Last Dose  . lactated ringers bolus 2,000 mL  2,000 mL Intravenous Once Michael [name], MD       Followed by  . lactated ringers infusion  Intravenous Continuous Michael [name], MD      . lactated ringers bolus 2,000 mL  2,000 mL Intravenous Once Michael [name], MD       Followed by  . lactated ringers infusion   Intravenous Continuous Michael [name], MD      . lactated ringers infusion   Intravenous Continuous Michael [name], MD      . ondansetron (ZOFRAN-ODT) disintegrating tablet 4 mg  4 mg Oral Q4H PRN Michael [name], MD       Or  . ondansetron (ZOFRAN) 4 mg in sodium chloride 0.9 % 50 mL IVPB  4 mg Intravenous Q4H PRN Michael [name], MD      . ondansetron (ZOFRAN-ODT) disintegrating tablet 4 mg  4 mg Oral Q4H PRN Michael [name], MD       Or  . ondansetron (ZOFRAN) 4 mg in sodium chloride 0.9 % 50 mL IVPB  4 mg Intravenous Q4H PRN Michael [name], MD      . ondansetron (ZOFRAN-ODT) disintegrating tablet 4 mg  4 mg Oral Q4H PRN Michael [name], MD       Or  . ondansetron (ZOFRAN) 4 mg in sodium chloride 0.9 % 50 mL IVPB  4 mg Intravenous Q4H PRN Michael [name], MD         Objective: Vital signs in last 24 hours: Temp:  [98 F (36.7 C)-102.1 F (38.9 C)] 98.7 F (37.1 C) (02/02 0555) Pulse Rate:   [76-118] 83 (02/02 0555) Resp:  [0-18] 16 (02/02 0555) BP: (85-134)/(46-81) 134/62 (02/02 0555) SpO2:  [99 %-100 %] 100 % (02/02 0555) Weight:  [207 lb 3.7 oz (94 kg)] 207 lb 3.7 oz (94 kg) (02/02 0555)  Intake/Output from previous day: 02/01 0701 - 02/02 0700 In: 1152 [P.O.:480; I.V.:272; IV Piggyback:400] Out: 2505 [Urine:1675; Stool:150] Intake/Output this shift: Total I/O In: 370 [P.O.:360; I.V.:10] Out: 1625 [Urine:1475; Stool:150]   Physical Exam  Constitutional: She is oriented to person, place, and time.  Tearful WD, WN WF in pain but in NAD.   Abdominal:  Bilateral NT's draining clear urine.   Genitourinary:  Genitourinary Comments: Foley draining clear urine.   Neurological: She is alert and oriented to person, place, and time.    Lab Results:  Recent Labs    09/02/17 0334 09/03/17 0455  WBC 5.7 4.4  HGB 8.1* 8.2*  HCT 23.3* 24.9*  PLT 184 221   BMET Recent Labs    09/01/17 0406 09/02/17 0334  NA 126* 125*  K 3.0* 3.3*  CL 98* 94*  CO2 19* 22  GLUCOSE 306* 357*  BUN 61* 59*  CREATININE 3.46* 3.61*  CALCIUM 7.6* 7.6*   PT/INR Recent Labs    09/02/17 0939  LABPROT 15.2  INR 1.21   ABG No results for input(s): PHART, HCO3 in the last 72 hours.  Invalid input(s): PCO2, PO2  Studies/Results: Ir Nephrostogram Left Initial Placement  Result Date: 09/02/2017 INDICATION: 62 year old female with a history bilateral ureteral obstruction. EXAM: IR NEPHROSTOGRAM INI PLACEMENT RIGHT; IR NEPHROSTOGRAM INI PLACEMENT LEFT COMPARISON:  CT 08/31/2017 MEDICATIONS: Ciprofloxacin; The antibiotic was administered in an appropriate time frame prior to skin puncture. ANESTHESIA/SEDATION: Fentanyl 100 mcg IV; Versed 1.0 mg IV Moderate Sedation Time:  27 minutes The patient was continuously monitored during the procedure by the interventional radiology nurse under my direct supervision. CONTRAST:  20 cc-administered into the collecting system(s) FLUOROSCOPY TIME:   Fluoroscopy Time: 1 minutes 30 seconds (32 mGy). COMPLICATIONS: None PROCEDURE: Informed written consent was obtained from the patient after  a thorough discussion of the procedural risks, benefits and alternatives. All questions were addressed. Maximal Sterile Barrier Technique was utilized including caps, mask, sterile gowns, sterile gloves, sterile drape, hand hygiene and skin antiseptic. A timeout was performed prior to the initiation of the procedure. Patient positioned prone position on the fluoroscopy table. The patient was then prepped and draped in the usual sterile fashion. 1% lidocaine was used to anesthetize the skin and subcutaneous tissues for local anesthesia. Left: Ultrasound survey of the left flank was performed with images stored and sent to PACs. A Chiba needle was then used to access a posterior inferior calyx with ultrasound guidance. With spontaneous urine returned through the needle, passage of an 018 micro wire into the collecting system was performed under fluoroscopy. A small incision was made with an 11 blade scalpel, and the needle was removed from the wire. An Accustick system was then advanced over the wire into the collecting system under fluoroscopy. The metal stiffener and inner dilator were removed, and then a sample of fluid was aspirated through the 4 French outer sheath. Bentson wire was passed into the collecting system and the sheath removed. Ten French dilation of the soft tissues was performed. Using modified Seldinger technique, a 10 French pigtail catheter drain was placed over the Bentson wire. Wire and inner stiffener removed, and the pigtail was formed in the collecting system. Small amount of contrast confirmed position of the catheter. Right: Ultrasound survey of the right flank was performed with images stored and sent to PACs. A Chiba needle was then used to access a posterior inferior calyx with ultrasound guidance. With spontaneous urine returned through the  needle, passage of an 018 micro wire into the collecting system was performed under fluoroscopy. A small incision was made with an 11 blade scalpel, and the needle was removed from the wire. An Accustick system was then advanced over the wire into the collecting system under fluoroscopy. The metal stiffener and inner dilator were removed, and then a sample of fluid was aspirated through the 4 French outer sheath. Bentson wire was passed into the collecting system and the sheath removed. Ten French dilation of the soft tissues was performed. Using modified Seldinger technique, a 10 French pigtail catheter drain was placed over the Bentson wire. Wire and inner stiffener removed, and the pigtail was formed in the collecting system. Small amount of contrast confirmed position of the catheter. Patient tolerated the procedure well and remained hemodynamically stable throughout. No complications were encountered and no significant blood loss encountered IMPRESSION: Status post bilateral percutaneous nephrostomy. Signed, Dulcy Fanny. Earleen Newport, DO [facility] Radiology Electronically Signed   By: Corrie Mckusick D.O.   On: 09/02/2017 17:32   Ir Nephrostogram Right Initial Placement  Result Date: 09/02/2017 INDICATION: 62 year old female with a history bilateral ureteral obstruction. EXAM: IR NEPHROSTOGRAM INI PLACEMENT RIGHT; IR NEPHROSTOGRAM INI PLACEMENT LEFT COMPARISON:  CT 08/31/2017 MEDICATIONS: Ciprofloxacin; The antibiotic was administered in an appropriate time frame prior to skin puncture. ANESTHESIA/SEDATION: Fentanyl 100 mcg IV; Versed 1.0 mg IV Moderate Sedation Time:  27 minutes The patient was continuously monitored during the procedure by the interventional radiology nurse under my direct supervision. CONTRAST:  20 cc-administered into the collecting system(s) FLUOROSCOPY TIME:  Fluoroscopy Time: 1 minutes 30 seconds (32 mGy). COMPLICATIONS: None PROCEDURE: Informed  written consent was obtained from the patient after a thorough discussion of the procedural risks, benefits and alternatives. All questions were addressed. Maximal Sterile Barrier Technique was utilized  including caps, mask, sterile gowns, sterile gloves, sterile drape, hand hygiene and skin antiseptic. A timeout was performed prior to the initiation of the procedure. Patient positioned prone position on the fluoroscopy table. The patient was then prepped and draped in the usual sterile fashion. 1% lidocaine was used to anesthetize the skin and subcutaneous tissues for local anesthesia. Left: Ultrasound survey of the left flank was performed with images stored and sent to PACs. A Chiba needle was then used to access a posterior inferior calyx with ultrasound guidance. With spontaneous urine returned through the needle, passage of an 018 micro wire into the collecting system was performed under fluoroscopy. A small incision was made with an 11 blade scalpel, and the needle was removed from the wire. An Accustick system was then advanced over the wire into the collecting system under fluoroscopy. The metal stiffener and inner dilator were removed, and then a sample of fluid was aspirated through the 4 French outer sheath. Bentson wire was passed into the collecting system and the sheath removed. Ten French dilation of the soft tissues was performed. Using modified Seldinger technique, a 10 French pigtail catheter drain was placed over the Bentson wire. Wire and inner stiffener removed, and the pigtail was formed in the collecting system. Small amount of contrast confirmed position of the catheter. Right: Ultrasound survey of the right flank was performed with images stored and sent to PACs. A Chiba needle was then used to access a posterior inferior calyx with ultrasound guidance. With spontaneous urine returned through the needle, passage of an 018 micro wire into the collecting system was performed under  fluoroscopy. A small incision was made with an 11 blade scalpel, and the needle was removed from the wire. An Accustick system was then advanced over the wire into the collecting system under fluoroscopy. The metal stiffener and inner dilator were removed, and then a sample of fluid was aspirated through the 4 French outer sheath. Bentson wire was passed into the collecting system and the sheath removed. Ten French dilation of the soft tissues was performed. Using modified Seldinger technique, a 10 French pigtail catheter drain was placed over the Bentson wire. Wire and inner stiffener removed, and the pigtail was formed in the collecting system. Small amount of contrast confirmed position of the catheter. Patient tolerated the procedure well and remained hemodynamically stable throughout. No complications were encountered and no significant blood loss encountered IMPRESSION: Status post bilateral percutaneous nephrostomy. Signed, Dulcy Fanny. Earleen Newport, DO [facility] Electronically Signed   By: Corrie Mckusick D.O.   On: 09/02/2017 17:32     Assessment: Posterior colovesical fistula.  She continues to have rectal drainage despite draining bilateral NT's and foley.   I will not remove the foley at this time.           Irine Seal 09/03/2017 [phone]

## 2017-09-03 NOTE — Progress Notes (Signed)
Pharmacy Antibiotic Note  [name] is a 62 y.o. female admitted on 08/29/2017 with possible UTI, in acute renal failure, and CT showed new colovesical fistula.  Pharmacy has been consulted for Ciprofloxacin dosing.  SCr remains 3.6, CrCl ~ 17 ml/min WBC WNL Tm 102.1  Plan: Continue Cipro 200mg  IV q24hr Follow up renal fxn, culture results, and clinical course.   Height: 5\' 3"  (160 cm) Weight: 207 lb 3.7 oz (94 kg) IBW/kg (Calculated) : 52.4  Temp (24hrs), Avg:99.1 F (37.3 C), Min:98 F (36.7 C), Max:102.1 F (38.9 C)  Recent Labs  Lab 08/30/17 0623 08/31/17 0451 09/01/17 0406 09/02/17 0334 09/03/17 0455  WBC 2.8* 1.7* 4.3 5.7 4.4  CREATININE 4.24* 3.60* 3.46* 3.61* 3.59*    Estimated Creatinine Clearance: 17.5 mL/min (A) (by C-G formula based on SCr of 3.59 mg/dL (H)).    Allergies  Allergen Reactions  . Penicillins Swelling    Facial swelling/childhood allergy Has patient had a PCN reaction causing immediate rash, facial/tongue/throat swelling, SOB or lightheadedness with hypotension: Yes Has patient had a PCN reaction causing severe rash involving mucus membranes or skin necrosis: Yes Has patient had a PCN reaction that required hospitalization yes Has patient had a PCN reaction occurring within the last 10 years: No If all of the above answers are "NO", then may proceed with Cephalosporin use.   [name] Kitchen Ultram [Tramadol] Hives  . Adhesive [Tape]     blisters  . Cefaclor Rash    Ceclor  . Erythromycin     Gastritis, abd cramps  . Trimethoprim Rash  . Ciprofloxacin Other (See Comments)    Unknown On Dr notes   . Fluconazole Rash  . Oxycodone     " I just feel weird"  . Pectin Rash    Pectin ring for stoma  . Sulfa Antibiotics Rash   Antimicrobials this admission: 1/29 Cipro >>   Dose adjustments this admission:  Microbiology results: 1/29 BCx: ngtd 1/29 UCx: 80k Group B strep, >100k Serratia - sens to Cipro 1/30 UCx cath: >100k Serratia (sens: CTX,  cipro, gent, bactrim) 2/1 UCx (rt kidney):  2/1 UCx:   Thank you for allowing pharmacy to be a part of this patient's care.  Gretta Arab PharmD, BCPS Pager [phone] 09/03/2017 2:06 PM

## 2017-09-03 NOTE — Progress Notes (Addendum)
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 She underwent a prolonged procedure 04/01/2017 with colostomy takedown, ileocecal resection with coloanal anastomosis and diverting loop ileostomy, 5 hours of LOA, and right ureteral stent exchange with psoas bladder hitch. Since that time she reported significant ileostomy output and was felt not to be keeping up with per oral intake. Had ureteral stent removed Jan 2019, creatinine noted to be up to 3 from baseline of 1. Had been given IVF's as outpatient, but labs showed acute renal failure with creatinine 4.28, so patient admitted by surgery on 1/28. Nephrology, urology, and hospitalists consulted. CT showed new colovesical fistula.   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   Protein-calorie malnutrition, moderate (HCC)   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HCC)   Chronic kidney disease (CKD), stage III (moderate) (HCC)   Dehydration   ARF (acute renal failure) (HCC)   Adjustment disorder with mixed anxiety and depressed mood   Poorly controlled diabetes mellitus (HCC)   Pressure injury of skin   History of external beam radiation therapy to pelvis   Acute  renal failure on stage II CKD: Suspected prerenal etiology (both decreased po intake and high output ileostomy) without imaging evidence of urinary obstruction - sp bilat PCN in IR 09/02/17 - nephrology following, stopped IVF yest 2/1 and ordered 1 dose IV lasix 80 mg, pt thinks she got it but the [facility] doesn't show it was given - trend BMP, UOP - mobilize as tolerated  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 - Foley placed to limit diversion - sp bilateral PCN's by IR on 09/02/17  Recurrent endometrial CA, s/p resection/ adjuvant radiation/ partial vaginectomy/ LAR/colostomy April 2016: - no chemotherapy due to idiopathic neutropenia.   History of breast CA s/p bilateral mastectomies: Stage IIA, ER/PR positive. Followed by Dr. Denman [name].  - continue arimidex   Chronic idiopathic neutropenia: wbc up from 1.7 to 4- 6k range, pt self-dosed filagrastin the night before admission while at home - followed by Dr. Alvy Bimler  Iron deficiency anemia: - sp 1u PRBCs 1/31. Continue monitoring.  Hb  7- 9 range.   High output ileostomy:  - WOC consulted  Polymicrobial UTI: >100k Serratia marcescens, >80k GBS on UCx, susceptibilities pending. CXR without infiltrate, blood cx's negative x 2.  No cutaneous infection/PICC site/ostomy issues.  - Monitor blood cultures - hx of serious reactions to PCNs, continue IV cipro.   Labile affect, adjustment disorder, possible depression: No current SI/HI.  - Zoloft started at admission.  - Would benefit from counseling as outpatient. Reports having significant social supports in place.  T2DM: HbA1c 5.3% in Aug 2018, now up to 7.9%.  - poor control on lantus 10 qd, per gen surg was increased to lantus 20 u bid today.  Cont SSI  as well.   Moderate protein-calorie malnutrition: Suspect adding protein would benefit patient even with renal failure, but defer to nephrology if protein should be restricted.  - RD consulted, supplement as pt can tolerate.   Hypokalemia,  hypomagnesemia:  - Replacement per nephrology.  Hypothyroidism: TSH 2.408 - Continue stable dose synthroid.   Hyperlipidemia:  - Continue statin   DVT prophylaxis: Lovenox 30mg  Code Status: Full Family Communication: no family here Disposition Plan: uncertain  Consultants:   Hospitalists  Nephrology  Urology  Procedures:   IR placement bilat PCN on 09/02/17  Antimicrobials:  Ciprofloxacin 1/29 >>   Subjective: Feeling better today, making good urine out of both PCN's, left better than right PCN.  Still has urine in foley bag as well.  UOP 1.5 L yest.  Not sure if she ever got 80 mg IV lasix, ordered but not completed.     Objective: Vitals:   09/02/17 2353 09/03/17 0015 09/03/17 0132 09/03/17 0555  BP: (!) 109/50 (!) 101/49 (!) 101/46 134/62  Pulse: 88 87 76 83  Resp: 17 17 16 16   Temp: 98.3 F (36.8 C) 98.4 F (36.9 C) 98.6 F (37 C) 98.7 F (37.1 C)  TempSrc: Oral Oral Oral Oral  SpO2: 100% 100% 99% 100%  Weight:    94 kg (207 lb 3.7 oz)  Height:        Intake/Output Summary (Last 24 hours) at 09/03/2017 1224 Last data filed at 09/03/2017 1138 Gross per 24 hour  Intake 952 ml  Output 3200 ml  Net -2248 ml   Filed Weights   08/30/17 0552 09/03/17 0555  Weight: 82.9 kg (182 lb 12.2 oz) 94 kg (207 lb 3.7 oz)   Gen: Chronically ill-appearing 62 y.o. female in no distress Pulm: Non-labored borderline tachypnea on room air. Clear to auscultation bilaterally.  CV: Regular tachycardia. No murmur, rub, or gallop. No JVD, no pedal edema. GI: Abdomen soft, mild tenderness diffusely without rebound or guarding, non-distended, with normoactive bowel sounds. No organomegaly or masses felt. Ext: Warm, no deformities Skin: Ileostomy site without tenderness, erythema, discharge; DL PICC right brachial c/d/i. Alopecia noted. Left index finger and thumb with intact blistering.  Neuro: Alert and oriented. No focal neurological deficits. Psych: Judgement and insight appear  normal. Mood labile with congruent, broad affect.    CBC: Recent Labs  Lab 08/30/17 0623 08/31/17 0451 09/01/17 0406 09/02/17 0334 09/03/17 0455  WBC 2.8* 1.7* 4.3 5.7 4.4  NEUTROABS 1.8  --  3.1  --  3.3  HGB 9.4* 7.9* 8.4* 8.1* 8.2*  HCT 26.8* 22.5* 24.5* 23.3* 24.9*  MCV 84.3 83.6 85.4 86.0 88.9  PLT 222 204 200 184 401   Basic Metabolic Panel: Recent Labs  Lab 08/29/17 1550 08/30/17 0623 08/31/17 0451 09/01/17 0406 09/02/17 0334 09/03/17 0455  NA 129* 130* 128* 126* 125* 132*  K 3.2* 3.0* 3.6 3.0* 3.3* 3.6  CL 106 107 105 98* 94* 99*  CO2 11* 10* 15* 19* 22 23  GLUCOSE 180* 182* 221* 306* 357* 187*  BUN 79* 81* 69* 61* 59* 56*  CREATININE 4.28* 4.24* 3.60* 3.46* 3.61* 3.59*  CALCIUM 9.0 8.8* 8.1* 7.6* 7.6* 8.5*  MG 1.1* 1.1* 1.3* 2.0 1.5* 1.9  PHOS 4.6  --  2.0* 2.5 2.5 3.8   Liver Function Tests: Recent Labs  Lab 08/29/17 1550 08/31/17 0451 09/01/17 0406 09/02/17 0334 09/03/17 0455  AST 16  --   --   --   --   ALT 24  --   --   --   --  ALKPHOS 173*  --   --   --   --   BILITOT 0.5  --   --   --   --   PROT 6.8  --   --   --   --   ALBUMIN 2.1* 1.7* 1.6* 1.4* 1.9*   HbA1C: Recent Labs    09/01/17 0406  HGBA1C 7.9*   CBG: Recent Labs  Lab 09/02/17 1337 09/02/17 1742 09/02/17 1937 09/02/17 2207 09/03/17 0803  GLUCAP 155* 122* 189* 230* 142*   Thyroid Function Tests: No results for input(s): TSH, T4TOTAL, FREET4, T3FREE, THYROIDAB in the last 72 hours. Anemia Panel: No results for input(s): VITAMINB12, FOLATE, FERRITIN, TIBC, IRON, RETICCTPCT in the last 72 hours. Urine analysis:    Component Value Date/Time   COLORURINE YELLOW 08/30/2017 1240   APPEARANCEUR CLOUDY (A) 08/30/2017 1240   LABSPEC 1.006 08/30/2017 1240   LABSPEC 1.015 11/17/2015 1436   PHURINE 5.0 08/30/2017 1240   GLUCOSEU NEGATIVE 08/30/2017 1240   GLUCOSEU 2,000 11/17/2015 1436   HGBUR MODERATE (A) 08/30/2017 1240   BILIRUBINUR NEGATIVE 08/30/2017 1240    BILIRUBINUR neg 08/30/2016 1025   BILIRUBINUR Negative 11/17/2015 1436   KETONESUR NEGATIVE 08/30/2017 1240   PROTEINUR 100 (A) 08/30/2017 1240   UROBILINOGEN negative 08/30/2016 1025   UROBILINOGEN 0.2 11/17/2015 1436   NITRITE NEGATIVE 08/30/2017 1240   LEUKOCYTESUR LARGE (A) 08/30/2017 1240   LEUKOCYTESUR Moderate 11/17/2015 1436   Recent Results (from the past 240 hour(s))  Culture, Urine     Status: Abnormal   Collection Time: 08/30/17 10:10 AM  Result Value Ref Range Status   Specimen Description URINE, RANDOM  Final   Special Requests NONE  Final   Culture (A)  Final    >=100,000 COLONIES/mL SERRATIA MARCESCENS 80,000 COLONIES/mL GROUP B STREP(S.AGALACTIAE)ISOLATED TESTING AGAINST S. AGALACTIAE NOT ROUTINELY PERFORMED DUE TO PREDICTABILITY OF AMP/PEN/VAN SUSCEPTIBILITY. Performed at [facility], [place] [address], [place], [place] [address]    Report Status 09/01/2017 FINAL  Final   Organism ID, Bacteria SERRATIA MARCESCENS (A)  Final      Susceptibility   Serratia marcescens - MIC*    CEFAZOLIN >=64 RESISTANT Resistant     CEFTRIAXONE <=1 SENSITIVE Sensitive     CIPROFLOXACIN 1 SENSITIVE Sensitive     GENTAMICIN <=1 SENSITIVE Sensitive     NITROFURANTOIN 256 RESISTANT Resistant     TRIMETH/SULFA <=20 SENSITIVE Sensitive     * >=100,000 COLONIES/mL SERRATIA MARCESCENS  Culture, blood (Routine X 2) w Reflex to ID Panel     Status: None (Preliminary result)   Collection Time: 08/30/17  3:26 PM  Result Value Ref Range Status   Specimen Description   Final    BLOOD BLOOD LEFT HAND Performed at [facility] [address], [place], [place] [address]    Special Requests   Final    IN PEDIATRIC BOTTLE Blood Culture adequate volume Performed at [facility] [address], [place], [place] [address]    Culture   Final    NO GROWTH 4 DAYS Performed at [facility], [place] [address], [place],  [address]    Report  Status PENDING  Incomplete  Culture, blood (Routine X 2) w Reflex to ID Panel     Status: None (Preliminary result)   Collection Time: 08/30/17  3:30 PM  Result Value Ref Range Status   Specimen Description   Final    BLOOD LEFT ANTECUBITAL Performed at [facility], [place] [address], [place], [place] [address]    Special Requests   Final    IN PEDIATRIC BOTTLE Blood Culture adequate volume Performed at [facility] [address] [place], [place] [address]    Culture   Final    NO GROWTH 4 DAYS Performed at [facility], [place] [address], [place], [place] [address]    Report Status PENDING  Incomplete  Culture, Urine     Status: Abnormal   Collection Time: 08/31/17  6:10 PM  Result Value Ref Range Status   Specimen Description   Final    URINE, CATHETERIZED Performed at [facility] [address], [place], [place] [address]    Special Requests   Final    NONE Performed at [facility], [place] [address] [place], [place] [address]    Culture >=100,000 COLONIES/mL SERRATIA MARCESCENS (A)  Final   Report Status 09/03/2017 FINAL  Final   Organism ID, Bacteria SERRATIA MARCESCENS (A)  Final      Susceptibility   Serratia marcescens - MIC*    CEFAZOLIN >=64 RESISTANT Resistant     CEFTRIAXONE <=1 SENSITIVE Sensitive     CIPROFLOXACIN 1 SENSITIVE Sensitive     GENTAMICIN <=1 SENSITIVE Sensitive     NITROFURANTOIN 256 RESISTANT Resistant     TRIMETH/SULFA <=20 SENSITIVE Sensitive     * >=100,000 COLONIES/mL SERRATIA MARCESCENS      Radiology Studies: Ir Nephrostogram Left Initial Placement  Result Date: 09/02/2017 INDICATION: 62 year old female with a history bilateral ureteral obstruction. EXAM: IR NEPHROSTOGRAM INI PLACEMENT RIGHT; IR NEPHROSTOGRAM INI PLACEMENT LEFT COMPARISON:  CT 08/31/2017 MEDICATIONS: Ciprofloxacin; The antibiotic was administered in an appropriate time frame prior to skin  puncture. ANESTHESIA/SEDATION: Fentanyl 100 mcg IV; Versed 1.0 mg IV Moderate Sedation Time:  27 minutes The patient was continuously monitored during the procedure by the interventional radiology nurse under my direct supervision. CONTRAST:  20 cc-administered into the collecting system(s) FLUOROSCOPY TIME:  Fluoroscopy Time: 1 minutes 30 seconds (32 mGy). COMPLICATIONS: None PROCEDURE: Informed written consent was obtained from the patient after a thorough discussion of the procedural risks, benefits and alternatives. All questions were addressed. Maximal Sterile Barrier Technique was utilized including caps, mask, sterile gowns, sterile gloves, sterile drape, hand hygiene and skin antiseptic. A timeout was performed prior to the initiation of the procedure. Patient positioned prone position on the fluoroscopy table. The patient was then prepped and draped in the usual sterile fashion. 1% lidocaine was used to anesthetize the skin and subcutaneous tissues for local anesthesia. Left: Ultrasound survey of the left flank was performed with images stored and sent to PACs. A Chiba needle was then used to access a posterior inferior calyx with ultrasound guidance. With spontaneous urine returned through the needle, passage of an 018 micro wire into the collecting system was performed under fluoroscopy. A small incision was made with an 11 blade scalpel, and the needle was removed from the wire. An Accustick system was then advanced over the wire into the collecting system under fluoroscopy. The metal stiffener and inner dilator were removed, and then a sample of fluid was aspirated through the 4 French outer sheath. Bentson wire was passed into the collecting system and the sheath removed. Ten French dilation of the soft tissues was performed. Using modified Seldinger technique, a 10 French pigtail catheter drain was placed over the Bentson wire. Wire and inner stiffener removed, and the pigtail was formed in the  collecting  system. Small amount of contrast confirmed position of the catheter. Right: Ultrasound survey of the right flank was performed with images stored and sent to PACs. A Chiba needle was then used to access a posterior inferior calyx with ultrasound guidance. With spontaneous urine returned through the needle, passage of an 018 micro wire into the collecting system was performed under fluoroscopy. A small incision was made with an 11 blade scalpel, and the needle was removed from the wire. An Accustick system was then advanced over the wire into the collecting system under fluoroscopy. The metal stiffener and inner dilator were removed, and then a sample of fluid was aspirated through the 4 French outer sheath. Bentson wire was passed into the collecting system and the sheath removed. Ten French dilation of the soft tissues was performed. Using modified Seldinger technique, a 10 French pigtail catheter drain was placed over the Bentson wire. Wire and inner stiffener removed, and the pigtail was formed in the collecting system. Small amount of contrast confirmed position of the catheter. Patient tolerated the procedure well and remained hemodynamically stable throughout. No complications were encountered and no significant blood loss encountered IMPRESSION: Status post bilateral percutaneous nephrostomy. Signed, Dulcy Fanny. Earleen Newport, DO [facility] Radiology Electronically Signed   By: Corrie Mckusick D.O.   On: 09/02/2017 17:32   Ir Nephrostogram Right Initial Placement  Result Date: 09/02/2017 INDICATION: 62 year old female with a history bilateral ureteral obstruction. EXAM: IR NEPHROSTOGRAM INI PLACEMENT RIGHT; IR NEPHROSTOGRAM INI PLACEMENT LEFT COMPARISON:  CT 08/31/2017 MEDICATIONS: Ciprofloxacin; The antibiotic was administered in an appropriate time frame prior to skin puncture. ANESTHESIA/SEDATION: Fentanyl 100 mcg IV; Versed 1.0 mg IV Moderate Sedation  Time:  27 minutes The patient was continuously monitored during the procedure by the interventional radiology nurse under my direct supervision. CONTRAST:  20 cc-administered into the collecting system(s) FLUOROSCOPY TIME:  Fluoroscopy Time: 1 minutes 30 seconds (32 mGy). COMPLICATIONS: None PROCEDURE: Informed written consent was obtained from the patient after a thorough discussion of the procedural risks, benefits and alternatives. All questions were addressed. Maximal Sterile Barrier Technique was utilized including caps, mask, sterile gowns, sterile gloves, sterile drape, hand hygiene and skin antiseptic. A timeout was performed prior to the initiation of the procedure. Patient positioned prone position on the fluoroscopy table. The patient was then prepped and draped in the usual sterile fashion. 1% lidocaine was used to anesthetize the skin and subcutaneous tissues for local anesthesia. Left: Ultrasound survey of the left flank was performed with images stored and sent to PACs. A Chiba needle was then used to access a posterior inferior calyx with ultrasound guidance. With spontaneous urine returned through the needle, passage of an 018 micro wire into the collecting system was performed under fluoroscopy. A small incision was made with an 11 blade scalpel, and the needle was removed from the wire. An Accustick system was then advanced over the wire into the collecting system under fluoroscopy. The metal stiffener and inner dilator were removed, and then a sample of fluid was aspirated through the 4 French outer sheath. Bentson wire was passed into the collecting system and the sheath removed. Ten French dilation of the soft tissues was performed. Using modified Seldinger technique, a 10 French pigtail catheter drain was placed over the Bentson wire. Wire and inner stiffener removed, and the pigtail was formed in the collecting system. Small amount of contrast confirmed position of the catheter. Right:  Ultrasound survey of the right flank was performed with  images stored and sent to PACs. A Chiba needle was then used to access a posterior inferior calyx with ultrasound guidance. With spontaneous urine returned through the needle, passage of an 018 micro wire into the collecting system was performed under fluoroscopy. A small incision was made with an 11 blade scalpel, and the needle was removed from the wire. An Accustick system was then advanced over the wire into the collecting system under fluoroscopy. The metal stiffener and inner dilator were removed, and then a sample of fluid was aspirated through the 4 French outer sheath. Bentson wire was passed into the collecting system and the sheath removed. Ten French dilation of the soft tissues was performed. Using modified Seldinger technique, a 10 French pigtail catheter drain was placed over the Bentson wire. Wire and inner stiffener removed, and the pigtail was formed in the collecting system. Small amount of contrast confirmed position of the catheter. Patient tolerated the procedure well and remained hemodynamically stable throughout. No complications were encountered and no significant blood loss encountered IMPRESSION: Status post bilateral percutaneous nephrostomy. Signed, Dulcy Fanny. Earleen Newport, DO [facility] Radiology Electronically Signed   By: Corrie Mckusick D.O.   On: 09/02/2017 17:32    Scheduled Meds: . acetaminophen  1,000 mg Oral TID  . anastrozole  1 mg Oral Daily  . [START ON 09/04/2017] cholecalciferol  10,000 Units Oral Weekly  . diphenoxylate-atropine  1-2 tablet Oral Q8H  . enoxaparin (LOVENOX) injection  30 mg Subcutaneous Q24H  . famotidine  20 mg Oral Daily  . furosemide  80 mg Intravenous Once  . insulin aspart  0-20 Units Subcutaneous TID WC  . insulin aspart  0-5 Units Subcutaneous QHS  . insulin glargine  20 Units Subcutaneous BID  . levothyroxine  150 mcg Oral QAC breakfast  .  lip balm  1 application Topical BID  . metoCLOPramide  5 mg Oral TID AC  . polyvinyl alcohol  1 drop Both Eyes QHS  . prenatal multivitamin  1 tablet Oral Daily  . protein supplement shake  11 oz Oral BID BM  . psyllium  1 packet Oral Daily  . rosuvastatin  10 mg Oral QPM  . [START ON 09/05/2017] Tbo-Filgrastim  480 mcg Subcutaneous Weekly   Continuous Infusions: . sodium chloride    . ciprofloxacin Stopped (09/03/17 1112)  . magnesium sulfate 1 - 4 g bolus IVPB Stopped (09/02/17 1945)     LOS: 5 days   Time spent: 35 minutes.  Kelly Splinter MD [facility] pgr [phone] 09/03/2017, 12:24 PM  [facility] [email or web address] Password [email or web address] 09/03/2017, 12:24 PM

## 2017-09-03 NOTE — Progress Notes (Signed)
[facility] Rounding Note  Subjective/Interval History:   Bilateral perc tubes 2/1 UOP 1675 yesterday, 1375 so far today Creatinine down just a bit Still having rectal drainage With urine + ileostomy drainage neg balance past 24 hours  Upset about a leak in her ileostomy bag -  husband went home for supplies  Objective Vital signs in last 24 hours: Vitals:   09/02/17 2353 09/03/17 0015 09/03/17 0132 09/03/17 0555  BP: (!) 109/50 (!) 101/49 (!) 101/46 134/62  Pulse: 88 87 76 83  Resp: 17 17 16 16   Temp: 98.3 F (36.8 C) 98.4 F (36.9 C) 98.6 F (37 C) 98.7 F (37.1 C)  TempSrc: Oral Oral Oral Oral  SpO2: 100% 100% 99% 100%  Weight:    94 kg (207 lb 3.7 oz)  Height:       Weight change:   Intake/Output Summary (Last 24 hours) at 09/03/2017 1238 Last data filed at 09/03/2017 1138 Gross per 24 hour  Intake 952 ml  Output 3200 ml  Net -2248 ml   Physical examination BP 134/62 (BP Location: Right Leg)   Pulse 83   Temp 98.7 F (37.1 C) (Oral)   Resp 16   Ht 5\' 3"  (1.6 m)   Wt 94 kg (207 lb 3.7 oz)   SpO2 100%   BMI 36.71 kg/m   Pale WF remains extremely weepy BP improved Sats 100% Extremely pale Wearing O2 Normal heart sounds  S1S2 No S3 No murmur Clear Abd with ileostomy bag with a leak in it Remains mildly tender in RLQ + BS 1+ LE edema, some sacral edema PICC R arm   Recent Labs  Lab 08/29/17 1550 08/30/17 0623 08/31/17 0451 09/01/17 0406 09/02/17 0334 09/03/17 0455  NA 129* 130* 128* 126* 125* 132*  K 3.2* 3.0* 3.6 3.0* 3.3* 3.6  CL 106 107 105 98* 94* 99*  CO2 11* 10* 15* 19* 22 23  GLUCOSE 180* 182* 221* 306* 357* 187*  BUN 79* 81* 69* 61* 59* 56*  CREATININE 4.28* 4.24* 3.60* 3.46* 3.61* 3.59*  CALCIUM 9.0 8.8* 8.1* 7.6* 7.6* 8.5*  PHOS 4.6  --  2.0* 2.5 2.5 3.8   Recent Labs  Lab 08/29/17 1550  09/01/17 0406 09/02/17 0334 09/03/17 0455  AST 16  --   --   --   --   ALT 24  --   --   --   --   ALKPHOS 173*  --   --   --   --   BILITOT  0.5  --   --   --   --   PROT 6.8  --   --   --   --   ALBUMIN 2.1*   < > 1.6* 1.4* 1.9*   < > = values in this interval not displayed.    Recent Labs  Lab 08/30/17 0623 08/31/17 0451 09/01/17 0406 09/02/17 0334 09/03/17 0455  WBC 2.8* 1.7* 4.3 5.7 4.4  NEUTROABS 1.8  --  3.1  --  3.3  HGB 9.4* 7.9* 8.4* 8.1* 8.2*  HCT 26.8* 22.5* 24.5* 23.3* 24.9*  MCV 84.3 83.6 85.4 86.0 88.9  PLT 222 204 200 184 221    Recent Labs  Lab 09/02/17 1742 09/02/17 1937 09/02/17 2207 09/03/17 0803 09/03/17 1216  GLUCAP 122* 189* 230* 142* 304*   09/03/17 Mg 1.9  Studies/Results: Ir Nephrostogram Left Initial Placement  Result Date: 09/02/2017 INDICATION: 62 year old female with a history bilateral ureteral obstruction. EXAM: IR NEPHROSTOGRAM INI PLACEMENT RIGHT;  IR NEPHROSTOGRAM INI PLACEMENT LEFT COMPARISON:  CT 08/31/2017 MEDICATIONS: Ciprofloxacin; The antibiotic was administered in an appropriate time frame prior to skin puncture. ANESTHESIA/SEDATION: Fentanyl 100 mcg IV; Versed 1.0 mg IV Moderate Sedation Time:  27 minutes The patient was continuously monitored during the procedure by the interventional radiology nurse under my direct supervision. CONTRAST:  20 cc-administered into the collecting system(s) FLUOROSCOPY TIME:  Fluoroscopy Time: 1 minutes 30 seconds (32 mGy). COMPLICATIONS: None PROCEDURE: Informed written consent was obtained from the patient after a thorough discussion of the procedural risks, benefits and alternatives. All questions were addressed. Maximal Sterile Barrier Technique was utilized including caps, mask, sterile gowns, sterile gloves, sterile drape, hand hygiene and skin antiseptic. A timeout was performed prior to the initiation of the procedure. Patient positioned prone position on the fluoroscopy table. The patient was then prepped and draped in the usual sterile fashion. 1% lidocaine was used to anesthetize the skin and subcutaneous tissues for local anesthesia. Left:  Ultrasound survey of the left flank was performed with images stored and sent to PACs. A Chiba needle was then used to access a posterior inferior calyx with ultrasound guidance. With spontaneous urine returned through the needle, passage of an 018 micro wire into the collecting system was performed under fluoroscopy. A small incision was made with an 11 blade scalpel, and the needle was removed from the wire. An Accustick system was then advanced over the wire into the collecting system under fluoroscopy. The metal stiffener and inner dilator were removed, and then a sample of fluid was aspirated through the 4 French outer sheath. Bentson wire was passed into the collecting system and the sheath removed. Ten French dilation of the soft tissues was performed. Using modified Seldinger technique, a 10 French pigtail catheter drain was placed over the Bentson wire. Wire and inner stiffener removed, and the pigtail was formed in the collecting system. Small amount of contrast confirmed position of the catheter. Right: Ultrasound survey of the right flank was performed with images stored and sent to PACs. A Chiba needle was then used to access a posterior inferior calyx with ultrasound guidance. With spontaneous urine returned through the needle, passage of an 018 micro wire into the collecting system was performed under fluoroscopy. A small incision was made with an 11 blade scalpel, and the needle was removed from the wire. An Accustick system was then advanced over the wire into the collecting system under fluoroscopy. The metal stiffener and inner dilator were removed, and then a sample of fluid was aspirated through the 4 French outer sheath. Bentson wire was passed into the collecting system and the sheath removed. Ten French dilation of the soft tissues was performed. Using modified Seldinger technique, a 10 French pigtail catheter drain was placed over the Bentson wire. Wire and inner stiffener removed, and the  pigtail was formed in the collecting system. Small amount of contrast confirmed position of the catheter. Patient tolerated the procedure well and remained hemodynamically stable throughout. No complications were encountered and no significant blood loss encountered IMPRESSION: Status post bilateral percutaneous nephrostomy. Signed, Dulcy Fanny. Earleen Newport, DO [facility] Radiology Electronically Signed   By: Corrie Mckusick D.O.   On: 09/02/2017 17:32   Ir Nephrostogram Right Initial Placement  Result Date: 09/02/2017 INDICATION: 62 year old female with a history bilateral ureteral obstruction. EXAM: IR NEPHROSTOGRAM INI PLACEMENT RIGHT; IR NEPHROSTOGRAM INI PLACEMENT LEFT COMPARISON:  CT 08/31/2017 MEDICATIONS: Ciprofloxacin; The antibiotic was administered in an appropriate time frame  prior to skin puncture. ANESTHESIA/SEDATION: Fentanyl 100 mcg IV; Versed 1.0 mg IV Moderate Sedation Time:  27 minutes The patient was continuously monitored during the procedure by the interventional radiology nurse under my direct supervision. CONTRAST:  20 cc-administered into the collecting system(s) FLUOROSCOPY TIME:  Fluoroscopy Time: 1 minutes 30 seconds (32 mGy). COMPLICATIONS: None PROCEDURE: Informed written consent was obtained from the patient after a thorough discussion of the procedural risks, benefits and alternatives. All questions were addressed. Maximal Sterile Barrier Technique was utilized including caps, mask, sterile gowns, sterile gloves, sterile drape, hand hygiene and skin antiseptic. A timeout was performed prior to the initiation of the procedure. Patient positioned prone position on the fluoroscopy table. The patient was then prepped and draped in the usual sterile fashion. 1% lidocaine was used to anesthetize the skin and subcutaneous tissues for local anesthesia. Left: Ultrasound survey of the left flank was performed with images stored and sent to PACs. A Chiba  needle was then used to access a posterior inferior calyx with ultrasound guidance. With spontaneous urine returned through the needle, passage of an 018 micro wire into the collecting system was performed under fluoroscopy. A small incision was made with an 11 blade scalpel, and the needle was removed from the wire. An Accustick system was then advanced over the wire into the collecting system under fluoroscopy. The metal stiffener and inner dilator were removed, and then a sample of fluid was aspirated through the 4 French outer sheath. Bentson wire was passed into the collecting system and the sheath removed. Ten French dilation of the soft tissues was performed. Using modified Seldinger technique, a 10 French pigtail catheter drain was placed over the Bentson wire. Wire and inner stiffener removed, and the pigtail was formed in the collecting system. Small amount of contrast confirmed position of the catheter. Right: Ultrasound survey of the right flank was performed with images stored and sent to PACs. A Chiba needle was then used to access a posterior inferior calyx with ultrasound guidance. With spontaneous urine returned through the needle, passage of an 018 micro wire into the collecting system was performed under fluoroscopy. A small incision was made with an 11 blade scalpel, and the needle was removed from the wire. An Accustick system was then advanced over the wire into the collecting system under fluoroscopy. The metal stiffener and inner dilator were removed, and then a sample of fluid was aspirated through the 4 French outer sheath. Bentson wire was passed into the collecting system and the sheath removed. Ten French dilation of the soft tissues was performed. Using modified Seldinger technique, a 10 French pigtail catheter drain was placed over the Bentson wire. Wire and inner stiffener removed, and the pigtail was formed in the collecting system. Small amount of contrast confirmed position of the  catheter. Patient tolerated the procedure well and remained hemodynamically stable throughout. No complications were encountered and no significant blood loss encountered IMPRESSION: Status post bilateral percutaneous nephrostomy. Signed, Dulcy Fanny. Earleen Newport, DO [facility] Radiology Electronically Signed   By: Corrie Mckusick D.O.   On: 09/02/2017 17:32   Medications: . sodium chloride    . ciprofloxacin Stopped (09/03/17 1112)  . magnesium sulfate 1 - 4 g bolus IVPB Stopped (09/02/17 1945)   . anastrozole  1 mg Oral Daily  . [START ON 09/04/2017] cholecalciferol  10,000 Units Oral Weekly  . enoxaparin (LOVENOX) injection  30 mg Subcutaneous Q24H  . famotidine  20 mg Oral Daily  .  furosemide  80 mg Intravenous Once  . insulin aspart  0-20 Units Subcutaneous TID WC  . insulin aspart  0-5 Units Subcutaneous QHS  . insulin glargine  20 Units Subcutaneous BID  . levothyroxine  150 mcg Oral QAC breakfast  . lip balm  1 application Topical BID  . metoCLOPramide  5 mg Oral TID AC  . polyvinyl alcohol  1 drop Both Eyes QHS  . prenatal multivitamin  1 tablet Oral Daily  . protein supplement shake  11 oz Oral BID BM  . psyllium  1 packet Oral Daily  . rosuvastatin  10 mg Oral QPM  . [START ON 09/05/2017] Tbo-Filgrastim  480 mcg Subcutaneous Weekly   Assessment/Recommendations  1. Acute kidney injury - Volume depletion was top of the list on admission 2/2 inadequate intake in face of high output ileostomy. Some degree of hydro chronically. New urine leak identified with rectovesical fistula. Whatever proximate cause of renal failure is, has been present since at least 08/17/17 with SCr 3 when ureteral stent removed (baseline 1). Possible  ATN from prolonged volume depletion; can't exclude effects of mechanical obstruction long term (chronic but "stable" hydro per Urology). Bilateral perc tubes 2/1, good UOP, creatinine down just a bit.  2. Volume - rec'd alb  yesterday but lasix not given. Neg I/O past 24 hours without it. 3. Metabolic acidosis  - on metformin with AKI.  Resolved post isotonic bicarb infusion. 4. Hypokalemia - 4 runs yesterday + IV Mag. 3.6 today 5. Hypomagnesemia - Repleted 2/1 6. Anemia - Transfused 1 U 1/31. Fe stores VERY low but no IV Fe while on IV ATB's 7. Polymicrobial UTI - serratia and Group B strep. Cipro. BC's neg. 8. Chronic neutropenia - last dosed herself with filagrastin on PM prior to admission (so 1/27). Today is usual day to administer and she wants to ge back on her schedule. I think would be OK to do this   [name], MD [facility] [phone] pager 09/03/2017, 12:38 PM

## 2017-09-03 NOTE — Progress Notes (Signed)
Referring Physician(s): S Gross Dr Jeffie Pollock  Supervising Physician: Sandi Mariscal  Patient Status:  [facility] - In-pt  Chief Complaint:  B PCN placed 09/02/17 Ureteral obstruction  Subjective:  PCNs in place Sl tender at sites Clean and dry  Allergies: Penicillins; Ultram [tramadol]; Adhesive [tape]; Cefaclor; Erythromycin; Trimethoprim; Ciprofloxacin; Fluconazole; Oxycodone; Pectin; and Sulfa antibiotics  Medications: Prior to Admission medications   Medication Sig Start Date End Date Taking? Authorizing Provider  anastrozole (ARIMIDEX) 1 MG tablet TAKE 1 TABLET DAILY 12/08/16  Yes Gorsuch, Ni, MD  Biotin 5 MG TABS Take 5 mg by mouth every morning.    Yes [provider]  Cholecalciferol (VITAMIN D3) 10000 UNITS capsule Take 10,000 Units by mouth once a week. Sunday evening's   Yes [provider]  diphenoxylate-atropine (LOMOTIL) 2.5-0.025 MG tablet Take 1-2 tablets by mouth every 8 (eight) hours. TO PREVENT LOOSE BOWEL MOVEMENTS 05/20/17  Yes [provider]  Famotidine-Ca Carb-Mag Hydrox (PEPCID COMPLETE PO) Take 1 tablet by mouth daily as needed (INDIGESTION).   Yes [provider]  filgrastim (NEUPOGEN) 480 MCG/1.6ML injection Inject 1.6 ml under the skin every 5 days for life Patient taking differently: Inject 480 mcg into the skin See admin instructions. Inject 1.6 ml under the skin every 6 days for life 01/06/17  Yes Gorsuch, Ni, MD  levothyroxine (SYNTHROID, LEVOTHROID) 150 MCG tablet Take 150 mcg by mouth daily before breakfast.    Yes [provider]  loratadine (CLARITIN) 10 MG tablet Take 10 mg by mouth every morning.    Yes [provider]  omega-3 acid ethyl esters (LOVAZA) 1 G capsule Take 1 g by mouth 2 (two) times daily.    Yes [provider]  ondansetron (ZOFRAN-ODT) 4 MG disintegrating tablet Take 4 mg by mouth every 6 (six) hours as needed for nausea or vomiting.  04/12/17  Yes [provider]    Polyethyl Glycol-Propyl Glycol (SYSTANE OP) Place 1 drop into both eyes at bedtime.    Yes [provider]  Prenatal Vit-Fe Fumarate-FA (PRENATAL VITAMIN PO) Take 1 capsule by mouth daily. Takes prenatal because there are no dyes in it   Yes [provider]  prochlorperazine (COMPAZINE) 10 MG tablet Take 1 tablet (10 mg total) by mouth 2 (two) times daily as needed for nausea or vomiting. Patient taking differently: Take 10 mg by mouth 2 (two) times daily as needed for nausea or vomiting.  05/28/17  Yes Virgel Manifold, MD  promethazine (PHENERGAN) 25 MG tablet Take 1 tablet (25 mg total) by mouth every 6 (six) hours as needed for nausea or vomiting. 05/28/17  Yes Virgel Manifold, MD  ranitidine (ZANTAC) 150 MG tablet Take 150 mg by mouth 2 (two) times daily as needed for heartburn.    Yes [provider]  rosuvastatin (CRESTOR) 10 MG tablet Take 10 mg by mouth every evening.    Yes [provider]  silver sulfADIAZINE (SILVADENE) 1 % cream Apply 1 application topically daily. 08/10/17  Yes Shelda Pal, DO  sitaGLIPtin (JANUVIA) 100 MG tablet Take 100 mg by mouth every morning.    Yes [provider]  HYDROcodone-acetaminophen (NORCO/VICODIN) 5-325 MG tablet Take 1 tablet by mouth every 6 (six) hours as needed (Pain). Patient not taking: Reported on 08/29/2017 08/10/17   Shelda Pal, DO     Vital Signs: BP 134/62 (BP Location: Right Leg)   Pulse 83   Temp 98.7 F (37.1 C) (Oral)   Resp 16  Ht 5\' 3"  (1.6 m)   Wt 207 lb 3.7 oz (94 kg)   SpO2 100%   BMI 36.71 kg/m   Physical Exam  Constitutional: She is oriented to person, place, and time.  Abdominal: Soft.  Neurological: She is alert and oriented to person, place, and time.  Skin: Skin is warm and dry.  Sites are clean and dry Sl tender No bleeding  no hematoma OP clear yellow OP great   Nursing note and vitals reviewed.   Imaging: Ct Abdomen Pelvis Wo  Contrast  Result Date: 08/31/2017 CLINICAL DATA:  62 year old female status post bladder reconstruction in August 2018 for urinary leak. History of ovarian and uterine cancer in 2006 with recurrence in 2016 treated with chemotherapy and radiation therapy. EXAM: CT ABDOMEN AND PELVIS WITHOUT CONTRAST TECHNIQUE: Multidetector CT imaging of the abdomen and pelvis was performed following the standard protocol without IV contrast. COMPARISON:  CT the abdomen and pelvis 08/25/2017. FINDINGS: Lower chest: Bilateral breast implants incidentally noted. Otherwise, unremarkable. Hepatobiliary: Mild diffuse low attenuation throughout the hepatic parenchyma indicative of a background of mild hepatic steatosis. Calcified granuloma in the right lobe of the liver. No definite suspicious cystic or solid hepatic lesions are noted on today's noncontrast CT examination. Status post cholecystectomy. Pancreas: No pancreatic mass or peripancreatic inflammatory changes are noted on today's noncontrast CT examination. Spleen: Unremarkable. Adrenals/Urinary Tract: Moderate atrophy of the right kidney. Mild bilateral hydroureteronephrosis. Reflux of iodinated contrast material into the right renal collecting system. Nonobstructive calculi in the lower pole collecting system of left kidney, similar to prior study from 08/25/2017, measuring up to 11 mm. Urinary bladder is partially decompressed around an indwelling Foley catheter. 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 Bilateral adrenal glands are normal in appearance. Stomach/Bowel: Normal appearance of the stomach. No pathologic dilatation of small bowel or colon. Postoperative changes of partial colectomy and small bowel resections are noted, with right upper quadrant ostomy. Adjacent  to the rectovesical fistula there is a short extension of contrast which extends posteriorly into the presacral space and extends slightly cephalad, presumably a fistulous tract (best demonstrated on axial image 71 of series 2, sagittal images 50-58 of series 5 and coronal images 57-64 of series 4. Vascular/Lymphatic: No atherosclerotic calcifications in the abdominal or pelvic vasculature. No lymphadenopathy noted in the abdomen or pelvis. Reproductive: Status post total abdominal hysterectomy and bilateral salpingo-oophorectomy. Other: Trace volume of ascites in the low anatomic pelvis. No pneumoperitoneum. Musculoskeletal: There are no aggressive appearing lytic or blastic lesions noted in the visualized portions of the skeleton. IMPRESSION: 1. Rectovesical fistula, as above. 2. 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 3. Reflux of contrast material into the right kidney via the right ureter with moderate atrophy of the right kidney. Nonobstructive calculi in the lower pole collecting system of the left kidney measuring up to 11 mm. 4. Additional incidental findings, as above. These results will be called to the ordering clinician or representative by the Radiologist Assistant, and communication documented in the PACS or zVision Dashboard. Electronically Signed   By: Vinnie Langton M.D.   On: 08/31/2017 14:34   Dg Chest 2 View  Result Date: 08/31/2017 CLINICAL DATA:  Shortness of breath. EXAM:  CHEST  2 VIEW COMPARISON:  Radiographs of August 30, 2017. FINDINGS: The heart size and mediastinal contours are within normal limits. Both lungs are clear. No pneumothorax or pleural effusion is noted. Right-sided PICC line is unchanged in position. The visualized skeletal structures are unremarkable. IMPRESSION: No active cardiopulmonary disease. Electronically Signed   By: Marijo Conception, M.D.    On: 08/31/2017 10:06   Ir Nephrostogram Left Initial Placement  Result Date: 09/02/2017 INDICATION: 62 year old female with a history bilateral ureteral obstruction. EXAM: IR NEPHROSTOGRAM INI PLACEMENT RIGHT; IR NEPHROSTOGRAM INI PLACEMENT LEFT COMPARISON:  CT 08/31/2017 MEDICATIONS: Ciprofloxacin; The antibiotic was administered in an appropriate time frame prior to skin puncture. ANESTHESIA/SEDATION: Fentanyl 100 mcg IV; Versed 1.0 mg IV Moderate Sedation Time:  27 minutes The patient was continuously monitored during the procedure by the interventional radiology nurse under my direct supervision. CONTRAST:  20 cc-administered into the collecting system(s) FLUOROSCOPY TIME:  Fluoroscopy Time: 1 minutes 30 seconds (32 mGy). COMPLICATIONS: None PROCEDURE: Informed written consent was obtained from the patient after a thorough discussion of the procedural risks, benefits and alternatives. All questions were addressed. Maximal Sterile Barrier Technique was utilized including caps, mask, sterile gowns, sterile gloves, sterile drape, hand hygiene and skin antiseptic. A timeout was performed prior to the initiation of the procedure. Patient positioned prone position on the fluoroscopy table. The patient was then prepped and draped in the usual sterile fashion. 1% lidocaine was used to anesthetize the skin and subcutaneous tissues for local anesthesia. Left: Ultrasound survey of the left flank was performed with images stored and sent to PACs. A Chiba needle was then used to access a posterior inferior calyx with ultrasound guidance. With spontaneous urine returned through the needle, passage of an 018 micro wire into the collecting system was performed under fluoroscopy. A small incision was made with an 11 blade scalpel, and the needle was removed from the wire. An Accustick system was then advanced over the wire into the collecting system under fluoroscopy. The metal stiffener and inner dilator were removed, and  then a sample of fluid was aspirated through the 4 French outer sheath. Bentson wire was passed into the collecting system and the sheath removed. Ten French dilation of the soft tissues was performed. Using modified Seldinger technique, a 10 French pigtail catheter drain was placed over the Bentson wire. Wire and inner stiffener removed, and the pigtail was formed in the collecting system. Small amount of contrast confirmed position of the catheter. Right: Ultrasound survey of the right flank was performed with images stored and sent to PACs. A Chiba needle was then used to access a posterior inferior calyx with ultrasound guidance. With spontaneous urine returned through the needle, passage of an 018 micro wire into the collecting system was performed under fluoroscopy. A small incision was made with an 11 blade scalpel, and the needle was removed from the wire. An Accustick system was then advanced over the wire into the collecting system under fluoroscopy. The metal stiffener and inner dilator were removed, and then a sample of fluid was aspirated through the 4 French outer sheath. Bentson wire was passed into the collecting system and the sheath removed. Ten French dilation of the soft tissues was performed. Using modified Seldinger technique, a 10 French pigtail catheter drain was placed over the Bentson wire. Wire and inner stiffener removed, and the pigtail was formed in the collecting system. Small amount of contrast confirmed position of the catheter. Patient tolerated the procedure well and  remained hemodynamically stable throughout. No complications were encountered and no significant blood loss encountered IMPRESSION: Status post bilateral percutaneous nephrostomy. Signed, Dulcy Fanny. Earleen Newport, DO [facility] Electronically Signed   By: Corrie Mckusick D.O.   On: 09/02/2017 17:32   Ir Nephrostogram Right Initial Placement  Result Date:  09/02/2017 INDICATION: 62 year old female with a history bilateral ureteral obstruction. EXAM: IR NEPHROSTOGRAM INI PLACEMENT RIGHT; IR NEPHROSTOGRAM INI PLACEMENT LEFT COMPARISON:  CT 08/31/2017 MEDICATIONS: Ciprofloxacin; The antibiotic was administered in an appropriate time frame prior to skin puncture. ANESTHESIA/SEDATION: Fentanyl 100 mcg IV; Versed 1.0 mg IV Moderate Sedation Time:  27 minutes The patient was continuously monitored during the procedure by the interventional radiology nurse under my direct supervision. CONTRAST:  20 cc-administered into the collecting system(s) FLUOROSCOPY TIME:  Fluoroscopy Time: 1 minutes 30 seconds (32 mGy). COMPLICATIONS: None PROCEDURE: Informed written consent was obtained from the patient after a thorough discussion of the procedural risks, benefits and alternatives. All questions were addressed. Maximal Sterile Barrier Technique was utilized including caps, mask, sterile gowns, sterile gloves, sterile drape, hand hygiene and skin antiseptic. A timeout was performed prior to the initiation of the procedure. Patient positioned prone position on the fluoroscopy table. The patient was then prepped and draped in the usual sterile fashion. 1% lidocaine was used to anesthetize the skin and subcutaneous tissues for local anesthesia. Left: Ultrasound survey of the left flank was performed with images stored and sent to PACs. A Chiba needle was then used to access a posterior inferior calyx with ultrasound guidance. With spontaneous urine returned through the needle, passage of an 018 micro wire into the collecting system was performed under fluoroscopy. A small incision was made with an 11 blade scalpel, and the needle was removed from the wire. An Accustick system was then advanced over the wire into the collecting system under fluoroscopy. The metal stiffener and inner dilator were removed, and then a sample of fluid was aspirated through the 4 French outer sheath. Bentson  wire was passed into the collecting system and the sheath removed. Ten French dilation of the soft tissues was performed. Using modified Seldinger technique, a 10 French pigtail catheter drain was placed over the Bentson wire. Wire and inner stiffener removed, and the pigtail was formed in the collecting system. Small amount of contrast confirmed position of the catheter. Right: Ultrasound survey of the right flank was performed with images stored and sent to PACs. A Chiba needle was then used to access a posterior inferior calyx with ultrasound guidance. With spontaneous urine returned through the needle, passage of an 018 micro wire into the collecting system was performed under fluoroscopy. A small incision was made with an 11 blade scalpel, and the needle was removed from the wire. An Accustick system was then advanced over the wire into the collecting system under fluoroscopy. The metal stiffener and inner dilator were removed, and then a sample of fluid was aspirated through the 4 French outer sheath. Bentson wire was passed into the collecting system and the sheath removed. Ten French dilation of the soft tissues was performed. Using modified Seldinger technique, a 10 French pigtail catheter drain was placed over the Bentson wire. Wire and inner stiffener removed, and the pigtail was formed in the collecting system. Small amount of contrast confirmed position of the catheter. Patient tolerated the procedure well and remained hemodynamically stable throughout. No complications were encountered and no significant blood loss encountered IMPRESSION: Status post bilateral percutaneous nephrostomy.  Signed, Dulcy Fanny. Earleen Newport, DO [facility] Electronically Signed   By: Corrie Mckusick D.O.   On: 09/02/2017 17:32    Labs:  CBC: Recent Labs    08/31/17 0451 09/01/17 0406 09/02/17 0334 09/03/17 0455  WBC 1.7* 4.3 5.7 4.4  HGB 7.9* 8.4* 8.1* 8.2*  HCT  22.5* 24.5* 23.3* 24.9*  PLT 204 200 184 221    COAGS: Recent Labs    04/16/17 0047 09/02/17 0939  INR 1.21 1.21    BMP: Recent Labs    08/31/17 0451 09/01/17 0406 09/02/17 0334 09/03/17 0455  NA 128* 126* 125* 132*  K 3.6 3.0* 3.3* 3.6  CL 105 98* 94* 99*  CO2 15* 19* 22 23  GLUCOSE 221* 306* 357* 187*  BUN 69* 61* 59* 56*  CALCIUM 8.1* 7.6* 7.6* 8.5*  CREATININE 3.60* 3.46* 3.61* 3.59*  GFRNONAA 12* 13* 12* 13*  GFRAA 14* 15* 14* 15*    LIVER FUNCTION TESTS: Recent Labs    04/20/17 0510 04/21/17 0416 05/27/17 1724 08/29/17 1550 08/31/17 0451 09/01/17 0406 09/02/17 0334 09/03/17 0455  BILITOT 0.4 0.5 0.3 0.5  --   --   --   --   AST 13* 13* 24 16  --   --   --   --   ALT 10* 10* 33 24  --   --   --   --   ALKPHOS 177* 190* 166* 173*  --   --   --   --   PROT 5.8* 5.9* 9.2* 6.8  --   --   --   --   ALBUMIN 1.8* 1.8* 3.9 2.1* 1.7* 1.6* 1.4* 1.9*    Assessment and Plan:  B PCNs in place Stable labs Will follow Plan per Dr Jeffie Pollock   Electronically Signed: Monia Sabal A, PA-C 09/03/2017, 2:00 PM   I spent a total of 15 Minutes at the the patient's bedside AND on the patient's hospital floor or unit, greater than 50% of which was counseling/coordinating care for B PCN

## 2017-09-03 NOTE — Progress Notes (Signed)
[address] [place], [place] [address] Phone: [phone]  FAX: [phone]      [name] [id number] 04/17/1956  CARE TEAM:  PCP: Ann Held, DO  Outpatient Care Team: Patient Care Team: Carollee Herter, Alferd Apa, DO as PCP - General (Family Medicine) Heath Lark, MD as Consulting Physician (Hematology and Oncology) Everitt Amber, MD as Consulting Physician (Obstetrics and Gynecology) Michael [name], MD as Consulting Physician (General Surgery) Alexis Frock, MD as Consulting Physician (Urology) Altheimer, Legrand Como, MD as Consulting Physician (Endocrinology) Katy Apo, MD as Consulting Physician (Ophthalmology) Irene Limbo, MD as Consulting Physician (Plastic Surgery) Madelon Lips, MD as Consulting Physician (Nephrology)  Inpatient Treatment Team: Treatment Team: Attending Provider: Roney Jaffe, MD; Consulting Physician: Alexis Frock, MD; Registered Nurse: Illene Regulus, RN; Technician: Reuel Derby, NT; Consulting Physician: [name], MD; Consulting Physician: Fatima Blank, MD; Consulting Physician: Michael [name], MD; Technician: Leda Quail, NT; Consulting Physician: Patrecia Pour, MD; Consulting Physician: Heath Lark, MD; Technician: Virgia Land, [place]; Technician: Estrellita Ludwig, NT; Rounding Team: [facility], MD; Consulting Physician: Edison Pace, Md, MD; Occupational Therapist: Benito Mccreedy, OT   Problem List:   Principal Problem:   Colovesical fistula - delayed Active Problems:   Primary hypothyroidism   DM type 2 (diabetes mellitus, type 2) ([place])   History of ovarian & endometrial cancer   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 s/p resection/partial vaginectomy/ LAR/colostomy 11/19/2014   Morbid obesity ([place])   Ureteral stricture, right, 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)  04/01/2017   Pelvic cancer s/p colostomy takedown/loop ileostomy diversion 04/01/2017   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   Protein-calorie malnutrition, moderate ([place])   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[place])   Chronic kidney disease (CKD), stage III (moderate) ([facility])   Dehydration   ARF (acute renal failure) ([place])   Adjustment disorder with mixed anxiety and depressed mood   Poorly controlled diabetes mellitus ([place])   Pressure injury of skin   History of external beam radiation therapy to pelvis  SURGERY 04/01/2017     POST-OPERATIVE DIAGNOSIS:   Recurrent endometrial cancer status post low anterior resection and partial pelvic exeneration  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   SURGEON:Steven Gwynneth Aliment, MD- Primary (Dr Tresa Moore assist) PROCEDURE:  XI ROBOTIC LYSIS OF ADHESIONS x 5 hours XI ROBOTIC COLOSTOMY TAKEDOWN ROBOTIC SEWN COLOANAL ANASTOMOSIS Ileocecal resection with anastomosis Diverting loop ileostomy  SURGEON: Alexis Frock, MD - Primary (Dr Johney Maine assist) PROCEDURE:  Cystoscopy with R retrograde and stent exchange.  Robotic R ureteral reimplant with psoas bladder hitch.  Consults: urology   SURGERY 08/17/2017  DIAGNOSIS:  Right ureteral stricture, status post complex reconstruction.  PROCEDURES: 1. Cystoscopy with right retrograde pyelogram. 2. Removal of right ureteral stent.  PHYSICIAN:  Alexis Frock, MD        SPECIMENS:  Right ureteral stone for discard, inspected and intact.  FINDINGS: 1. Fusiform bladder shape consistent with prior psoas hitch. 2. Visibly patent ureteral-bladder anastomosis. 3. No evidence of hydronephrosis or narrowing with right retrograde     pyelogram.    Assessment  FAIR  Ileus from bladder leak resolving.  Delayed bladder leak turned into colovesical fistula with preferential urinary drainage in decompressed diverted colon limb.  Plan:   Evaluate perc nephrostomies.  Creatinine improving. Serratia  in urine  Zoloft for depression.  Diabetes still poorly controlled.  Agree with starting Lantus.  I increased it to 20 twice daily  to try and more aggressively correct things.  Until her glucose is less than 150, she will never rally  Solid diet with supp shakes.  Thankfully, her nausea vomiting and ileus seem to be resolving. Calorie counts.  Ate a good portion of breakfast.    -Ostomy nurse consultation for ileostomy bag   Ileus resolving.  Restart antidiarrheal regimen and fiber to help control things.  -Continue to monitor lytes  -Hemoglobin low but stable in pancytopenic pt  -Continue neupogen injections for pancytopenia  -Continue Rheumatex PO  -VTE prophylaxis: SCDs, etc  -Ambulate as tolerated to help recovery     [facility] [address], [place], [place] [address] [phone] Main / Paging    09/03/2017    Subjective: (Chief complaint)  Tired. Remains discouraged, but stable.  Complains of feeling palpitations.    Objective:  Vital signs:  Vitals:   09/02/17 2353 09/03/17 0015 09/03/17 0132 09/03/17 0555  BP: (!) 109/50 (!) 101/49 (!) 101/46 134/62  Pulse: 88 87 76 83  Resp: 17 17 16 16   Temp: 98.3 F (36.8 C) 98.4 F (36.9 C) 98.6 F (37 C) 98.7 F (37.1 C)  TempSrc: Oral Oral Oral Oral  SpO2: 100% 100% 99% 100%  Weight:    94 kg (207 lb 3.7 oz)  Height:        Last BM Date: 09/03/17  Intake/Output   Yesterday:  02/01 0701 - 02/02 0700 In: 8295 [P.O.:480; I.V.:272; IV Piggyback:400] Out: 6213 [YQMVH:8469; Stool:150] This shift:  No intake/output data recorded.  Bowel function:  Flatus: YES   BM:  YES.  Thick oatmeal consistency succus in ileostomy bag.  Drain: No drains.     Physical Exam:  General: Pt awake/alert/oriented x4 in no acute distress Psych:  Depressed affect.  HENT: Normocephalic, Mucus membranes moist.  No thrush Chest: No chest wall pain w good excursion CV:  Pulses  intact.  Regular rhythm   Abdomen: Soft.  Nondistended.  No tenderness to palpation.  No evidence of peritonitis.  No incarcerated hernias.  No wounds.  Ileostomy pink with gas and succus in bag.      GU.  No purulent drainage or hematoma.  Foley in place with clear light yellow urine  Results:   Labs: Results for orders placed or performed during the hospital encounter of 08/29/17 (from the past 48 hour(s))  Glucose, capillary     Status: Abnormal   Collection Time: 09/01/17 11:37 AM  Result Value Ref Range   Glucose-Capillary 352 (H) 65 - 99 mg/dL  Glucose, capillary     Status: Abnormal   Collection Time: 09/01/17  4:56 PM  Result Value Ref Range   Glucose-Capillary 262 (H) 65 - 99 mg/dL  Glucose, capillary     Status: Abnormal   Collection Time: 09/01/17  9:18 PM  Result Value Ref Range   Glucose-Capillary 272 (H) 65 - 99 mg/dL  CBC     Status: Abnormal   Collection Time: 09/02/17  3:34 AM  Result Value Ref Range   WBC 5.7 4.0 - 10.5 K/uL   RBC 2.71 (L) 3.87 - 5.11 MIL/uL   Hemoglobin 8.1 (L) 12.0 - 15.0 g/dL   HCT 23.3 (L) 36.0 - 46.0 %   MCV 86.0 78.0 - 100.0 fL   MCH 29.9 26.0 - 34.0 pg   MCHC 34.8 30.0 - 36.0 g/dL   RDW 15.1 11.5 - 15.5 %   Platelets 184 150 - 400 K/uL  Magnesium  Status: Abnormal   Collection Time: 09/02/17  3:34 AM  Result Value Ref Range   Magnesium 1.5 (L) 1.7 - 2.4 mg/dL  Renal function panel     Status: Abnormal   Collection Time: 09/02/17  3:34 AM  Result Value Ref Range   Sodium 125 (L) 135 - 145 mmol/L   Potassium 3.3 (L) 3.5 - 5.1 mmol/L   Chloride 94 (L) 101 - 111 mmol/L   CO2 22 22 - 32 mmol/L   Glucose, Bld 357 (H) 65 - 99 mg/dL   BUN 59 (H) 6 - 20 mg/dL   Creatinine, Ser 3.61 (H) 0.44 - 1.00 mg/dL   Calcium 7.6 (L) 8.9 - 10.3 mg/dL   Phosphorus 2.5 2.5 - 4.6 mg/dL   Albumin 1.4 (L) 3.5 - 5.0 g/dL   GFR calc non Af Amer 12 (L) >60 mL/min   GFR calc Af Amer 14 (L) >60 mL/min    Comment: (NOTE) The eGFR has been calculated  using the CKD EPI equation. This calculation has not been validated in all clinical situations. eGFR's persistently <60 mL/min signify possible Chronic Kidney Disease.    Anion gap 9 5 - 15  Glucose, capillary     Status: Abnormal   Collection Time: 09/02/17  7:21 AM  Result Value Ref Range   Glucose-Capillary 319 (H) 65 - 99 mg/dL  Protime-INR     Status: None   Collection Time: 09/02/17  9:39 AM  Result Value Ref Range   Prothrombin Time 15.2 11.4 - 15.2 seconds   INR 1.21     Comment: Performed at [facility], [place] [address], [place], [place] [address]  Glucose, capillary     Status: Abnormal   Collection Time: 09/02/17 11:54 AM  Result Value Ref Range   Glucose-Capillary 194 (H) 65 - 99 mg/dL  Glucose, capillary     Status: Abnormal   Collection Time: 09/02/17  1:37 PM  Result Value Ref Range   Glucose-Capillary 155 (H) 65 - 99 mg/dL  Glucose, capillary     Status: Abnormal   Collection Time: 09/02/17  5:42 PM  Result Value Ref Range   Glucose-Capillary 122 (H) 65 - 99 mg/dL  Glucose, capillary     Status: Abnormal   Collection Time: 09/02/17  7:37 PM  Result Value Ref Range   Glucose-Capillary 189 (H) 65 - 99 mg/dL  Glucose, capillary     Status: Abnormal   Collection Time: 09/02/17 10:07 PM  Result Value Ref Range   Glucose-Capillary 230 (H) 65 - 99 mg/dL  Magnesium     Status: None   Collection Time: 09/03/17  4:55 AM  Result Value Ref Range   Magnesium 1.9 1.7 - 2.4 mg/dL    Comment: Performed at [facility], [place] [address], [place], [place] [address]  Renal function panel     Status: Abnormal   Collection Time: 09/03/17  4:55 AM  Result Value Ref Range   Sodium 132 (L) 135 - 145 mmol/L    Comment: DELTA CHECK NOTED REPEATED TO VERIFY NO VISIBLE HEMOLYSIS    Potassium 3.6 3.5 - 5.1 mmol/L   Chloride 99 (L) 101 - 111 mmol/L   CO2 23 22 - 32 mmol/L   Glucose, Bld 187 (H) 65 - 99 mg/dL   BUN 56 (H) 6 - 20 mg/dL    Creatinine, Ser 3.59 (H) 0.44 - 1.00 mg/dL   Calcium 8.5 (L) 8.9 - 10.3 mg/dL   Phosphorus 3.8 2.5 -  4.6 mg/dL   Albumin 1.9 (L) 3.5 - 5.0 g/dL   GFR calc non Af Amer 13 (L) >60 mL/min   GFR calc Af Amer 15 (L) >60 mL/min    Comment: (NOTE) The eGFR has been calculated using the CKD EPI equation. This calculation has not been validated in all clinical situations. eGFR's persistently <60 mL/min signify possible Chronic Kidney Disease.    Anion gap 10 5 - 15    Comment: Performed at [facility], [place] [address], [place], [place] [address]  CBC with Differential/Platelet     Status: Abnormal   Collection Time: 09/03/17  4:55 AM  Result Value Ref Range   WBC 4.4 4.0 - 10.5 K/uL   RBC 2.80 (L) 3.87 - 5.11 MIL/uL   Hemoglobin 8.2 (L) 12.0 - 15.0 g/dL   HCT 24.9 (L) 36.0 - 46.0 %   MCV 88.9 78.0 - 100.0 fL   MCH 29.3 26.0 - 34.0 pg   MCHC 32.9 30.0 - 36.0 g/dL   RDW 15.1 11.5 - 15.5 %   Platelets 221 150 - 400 K/uL   Neutrophils Relative % 76 %   Neutro Abs 3.3 1.7 - 7.7 K/uL   Lymphocytes Relative 7 %   Lymphs Abs 0.3 (L) 0.7 - 4.0 K/uL   Monocytes Relative 14 %   Monocytes Absolute 0.6 0.1 - 1.0 K/uL   Eosinophils Relative 3 %   Eosinophils Absolute 0.1 0.0 - 0.7 K/uL   Basophils Relative 0 %   Basophils Absolute 0.0 0.0 - 0.1 K/uL    Comment: Performed at [facility], [place] [address], [place], [place] [address]  Glucose, capillary     Status: Abnormal   Collection Time: 09/03/17  8:03 AM  Result Value Ref Range   Glucose-Capillary 142 (H) 65 - 99 mg/dL    Imaging / Studies: Ir Nephrostogram Left Initial Placement  Result Date: 09/02/2017 INDICATION: 62 year old female with a history bilateral ureteral obstruction. EXAM: IR NEPHROSTOGRAM INI PLACEMENT RIGHT; IR NEPHROSTOGRAM INI PLACEMENT LEFT COMPARISON:  CT 08/31/2017 MEDICATIONS: Ciprofloxacin; The antibiotic was administered in an appropriate time frame prior to skin puncture.  ANESTHESIA/SEDATION: Fentanyl 100 mcg IV; Versed 1.0 mg IV Moderate Sedation Time:  27 minutes The patient was continuously monitored during the procedure by the interventional radiology nurse under my direct supervision. CONTRAST:  20 cc-administered into the collecting system(s) FLUOROSCOPY TIME:  Fluoroscopy Time: 1 minutes 30 seconds (32 mGy). COMPLICATIONS: None PROCEDURE: Informed written consent was obtained from the patient after a thorough discussion of the procedural risks, benefits and alternatives. All questions were addressed. Maximal Sterile Barrier Technique was utilized including caps, mask, sterile gowns, sterile gloves, sterile drape, hand hygiene and skin antiseptic. A timeout was performed prior to the initiation of the procedure. Patient positioned prone position on the fluoroscopy table. The patient was then prepped and draped in the usual sterile fashion. 1% lidocaine was used to anesthetize the skin and subcutaneous tissues for local anesthesia. Left: Ultrasound survey of the left flank was performed with images stored and sent to PACs. A Chiba needle was then used to access a posterior inferior calyx with ultrasound guidance. With spontaneous urine returned through the needle, passage of an 018 micro wire into the collecting system was performed under fluoroscopy. A small incision was made with an 11 blade scalpel, and the needle was removed from the wire. An Accustick system was then advanced over the wire into the collecting system under fluoroscopy. The metal stiffener  and inner dilator were removed, and then a sample of fluid was aspirated through the 4 French outer sheath. Bentson wire was passed into the collecting system and the sheath removed. Ten French dilation of the soft tissues was performed. Using modified Seldinger technique, a 10 French pigtail catheter drain was placed over the Bentson wire. Wire and inner stiffener removed, and the pigtail was formed in the collecting  system. Small amount of contrast confirmed position of the catheter. Right: Ultrasound survey of the right flank was performed with images stored and sent to PACs. A Chiba needle was then used to access a posterior inferior calyx with ultrasound guidance. With spontaneous urine returned through the needle, passage of an 018 micro wire into the collecting system was performed under fluoroscopy. A small incision was made with an 11 blade scalpel, and the needle was removed from the wire. An Accustick system was then advanced over the wire into the collecting system under fluoroscopy. The metal stiffener and inner dilator were removed, and then a sample of fluid was aspirated through the 4 French outer sheath. Bentson wire was passed into the collecting system and the sheath removed. Ten French dilation of the soft tissues was performed. Using modified Seldinger technique, a 10 French pigtail catheter drain was placed over the Bentson wire. Wire and inner stiffener removed, and the pigtail was formed in the collecting system. Small amount of contrast confirmed position of the catheter. Patient tolerated the procedure well and remained hemodynamically stable throughout. No complications were encountered and no significant blood loss encountered IMPRESSION: Status post bilateral percutaneous nephrostomy. Signed, Dulcy Fanny. Earleen Newport, DO [facility] Electronically Signed   By: Corrie Mckusick D.O.   On: 09/02/2017 17:32   Ir Nephrostogram Right Initial Placement  Result Date: 09/02/2017 INDICATION: 63 year old female with a history bilateral ureteral obstruction. EXAM: IR NEPHROSTOGRAM INI PLACEMENT RIGHT; IR NEPHROSTOGRAM INI PLACEMENT LEFT COMPARISON:  CT 08/31/2017 MEDICATIONS: Ciprofloxacin; The antibiotic was administered in an appropriate time frame prior to skin puncture. ANESTHESIA/SEDATION: Fentanyl 100 mcg IV; Versed 1.0 mg IV Moderate Sedation Time:  27  minutes The patient was continuously monitored during the procedure by the interventional radiology nurse under my direct supervision. CONTRAST:  20 cc-administered into the collecting system(s) FLUOROSCOPY TIME:  Fluoroscopy Time: 1 minutes 30 seconds (32 mGy). COMPLICATIONS: None PROCEDURE: Informed written consent was obtained from the patient after a thorough discussion of the procedural risks, benefits and alternatives. All questions were addressed. Maximal Sterile Barrier Technique was utilized including caps, mask, sterile gowns, sterile gloves, sterile drape, hand hygiene and skin antiseptic. A timeout was performed prior to the initiation of the procedure. Patient positioned prone position on the fluoroscopy table. The patient was then prepped and draped in the usual sterile fashion. 1% lidocaine was used to anesthetize the skin and subcutaneous tissues for local anesthesia. Left: Ultrasound survey of the left flank was performed with images stored and sent to PACs. A Chiba needle was then used to access a posterior inferior calyx with ultrasound guidance. With spontaneous urine returned through the needle, passage of an 018 micro wire into the collecting system was performed under fluoroscopy. A small incision was made with an 11 blade scalpel, and the needle was removed from the wire. An Accustick system was then advanced over the wire into the collecting system under fluoroscopy. The metal stiffener and inner dilator were removed, and then a sample of fluid was aspirated through the 4 French outer sheath. Bentson  wire was passed into the collecting system and the sheath removed. Ten French dilation of the soft tissues was performed. Using modified Seldinger technique, a 10 French pigtail catheter drain was placed over the Bentson wire. Wire and inner stiffener removed, and the pigtail was formed in the collecting system. Small amount of contrast confirmed position of the catheter. Right: Ultrasound  survey of the right flank was performed with images stored and sent to PACs. A Chiba needle was then used to access a posterior inferior calyx with ultrasound guidance. With spontaneous urine returned through the needle, passage of an 018 micro wire into the collecting system was performed under fluoroscopy. A small incision was made with an 11 blade scalpel, and the needle was removed from the wire. An Accustick system was then advanced over the wire into the collecting system under fluoroscopy. The metal stiffener and inner dilator were removed, and then a sample of fluid was aspirated through the 4 French outer sheath. Bentson wire was passed into the collecting system and the sheath removed. Ten French dilation of the soft tissues was performed. Using modified Seldinger technique, a 10 French pigtail catheter drain was placed over the Bentson wire. Wire and inner stiffener removed, and the pigtail was formed in the collecting system. Small amount of contrast confirmed position of the catheter. Patient tolerated the procedure well and remained hemodynamically stable throughout. No complications were encountered and no significant blood loss encountered IMPRESSION: Status post bilateral percutaneous nephrostomy. Signed, Dulcy Fanny. Earleen Newport, DO [facility]iology Electronically Signed   By: Corrie Mckusick D.O.   On: 09/02/2017 17:32    Medications / Allergies: per chart  Antibiotics: Anti-infectives (From admission, onward)   Start     Dose/Rate Route Frequency Ordered Stop   08/30/17 1000  ciprofloxacin (CIPRO) IVPB 200 mg     200 mg 100 mL/hr over 60 Minutes Intravenous Every 24 hours 08/30/17 0855          Note: Portions of this report may have been transcribed using voice recognition software. Every effort was made to ensure accuracy; however, inadvertent computerized transcription errors may be present.   Any transcriptional errors that result  from this process are unintentional.  [facility] [address] [place], [place] [address] [phone] Main / Paging    09/03/2017

## 2017-09-04 LAB — RENAL FUNCTION PANEL
Albumin: 1.8 g/dL — ABNORMAL LOW (ref 3.5–5.0)
Anion gap: 8 (ref 5–15)
BUN: 55 mg/dL — AB (ref 6–20)
CALCIUM: 8.4 mg/dL — AB (ref 8.9–10.3)
CO2: 23 mmol/L (ref 22–32)
CREATININE: 3.61 mg/dL — AB (ref 0.44–1.00)
Chloride: 101 mmol/L (ref 101–111)
GFR calc Af Amer: 14 mL/min — ABNORMAL LOW (ref >60)
GFR, EST NON AFRICAN AMERICAN: 12 mL/min — AB (ref >60)
Glucose, Bld: 209 mg/dL — ABNORMAL HIGH (ref 65–99)
PHOSPHORUS: 3.8 mg/dL (ref 2.5–4.6)
Potassium: 3.7 mmol/L (ref 3.5–5.1)
SODIUM: 132 mmol/L — AB (ref 135–145)

## 2017-09-04 LAB — GLUCOSE, CAPILLARY
GLUCOSE-CAPILLARY: 369 mg/dL — AB (ref 65–99)
GLUCOSE-CAPILLARY: 227 mg/dL — AB (ref 65–99)
GLUCOSE-CAPILLARY: 307 mg/dL — AB (ref 65–99)
Glucose-Capillary: 162 mg/dL — ABNORMAL HIGH (ref 65–99)

## 2017-09-04 LAB — CBC WITH DIFFERENTIAL/PLATELET
BASOS ABS: 0 10*3/uL (ref 0.0–0.1)
Basophils Relative: 0 %
EOS ABS: 0.1 10*3/uL (ref 0.0–0.7)
EOS PCT: 1 %
HCT: 26.7 % — ABNORMAL LOW (ref 36.0–46.0)
Hemoglobin: 8.6 g/dL — ABNORMAL LOW (ref 12.0–15.0)
LYMPHS ABS: 0.5 10*3/uL — AB (ref 0.7–4.0)
Lymphocytes Relative: 4 %
MCH: 29.2 pg (ref 26.0–34.0)
MCHC: 32.2 g/dL (ref 30.0–36.0)
MCV: 90.5 fL (ref 78.0–100.0)
MONO ABS: 1.2 10*3/uL — AB (ref 0.1–1.0)
Monocytes Relative: 9 %
Neutro Abs: 11.8 10*3/uL — ABNORMAL HIGH (ref 1.7–7.7)
Neutrophils Relative %: 86 %
PLATELETS: 268 10*3/uL (ref 150–400)
RBC: 2.95 MIL/uL — AB (ref 3.87–5.11)
RDW: 15.2 % (ref 11.5–15.5)
WBC Morphology: INCREASED
WBC: 13.6 10*3/uL — AB (ref 4.0–10.5)

## 2017-09-04 LAB — CULTURE, BLOOD (ROUTINE X 2)
Culture: NO GROWTH
Culture: NO GROWTH
SPECIAL REQUESTS: ADEQUATE
SPECIAL REQUESTS: ADEQUATE

## 2017-09-04 LAB — MAGNESIUM: MAGNESIUM: 1.3 mg/dL — AB (ref 1.7–2.4)

## 2017-09-04 MED ORDER — MAGNESIUM SULFATE 4 GM/100ML IV SOLN
4.0000 g | Freq: Once | INTRAVENOUS | Status: AC
  Administered 2017-09-04: 4 g via INTRAVENOUS
  Filled 2017-09-04: qty 100

## 2017-09-04 NOTE — Progress Notes (Signed)
Subjective: CC: Rectal urine leak.  Hx:  [name] continues to report rectal leakage but thinks it is improved.  She has less pain from the NT' which are draining well.  She has continued to have 100-337ml of foley drainage per 4 hrs but the output has been 0 ml in the last 4 hours.    ROS:  Review of Systems  Constitutional: Negative for chills and fever.  Genitourinary: Negative for flank pain.    Anti-infectives: Anti-infectives (From admission, onward)   Start     Dose/Rate Route Frequency Ordered Stop   08/30/17 1000  ciprofloxacin (CIPRO) IVPB 200 mg     200 mg 100 mL/hr over 60 Minutes Intravenous Every 24 hours 08/30/17 0855        Current Facility-Administered Medications  Medication Dose Route Frequency Provider Last Rate Last Dose  . 0.9 %  sodium chloride infusion   Intravenous Once Patrecia Pour, MD      . acetaminophen (TYLENOL) suppository 650 mg  650 mg Rectal Q6H PRN Roney Jaffe, MD   650 mg at 09/02/17 1523  . acetaminophen (TYLENOL) tablet 650 mg  650 mg Oral Q6H PRN Roney Jaffe, MD      . anastrozole (ARIMIDEX) tablet 1 mg  1 mg Oral Daily Michael [name], MD   1 mg at 09/03/17 1013  . cholecalciferol (VITAMIN D) tablet 10,000 Units  10,000 Units Oral Weekly Michael [name], MD      . ciprofloxacin (CIPRO) IVPB 200 mg  200 mg Intravenous Q24H Minda Ditto, RPH   Stopped at 09/03/17 1112  . enoxaparin (LOVENOX) injection 30 mg  30 mg Subcutaneous Q24H Allred, Darrell K, PA-C   30 mg at 09/03/17 2209  . famotidine (PEPCID) tablet 20 mg  20 mg Oral Daily Michael [name], MD   20 mg at 09/03/17 1013  . guaiFENesin-dextromethorphan (ROBITUSSIN DM) 100-10 MG/5ML syrup 10 mL  10 mL Oral Q4H PRN Michael [name], MD   10 mL at 09/01/17 2135  . HYDROmorphone (DILAUDID) injection 0.5-1 mg  0.5-1 mg Intravenous Q3H PRN Roney Jaffe, MD   1 mg at 09/04/17 0647  . insulin aspart (novoLOG) injection 0-20 Units  0-20 Units Subcutaneous TID WC Patrecia Pour, MD   4 Units at  09/03/17 1700  . insulin aspart (novoLOG) injection 0-5 Units  0-5 Units Subcutaneous QHS Patrecia Pour, MD   2 Units at 09/03/17 2209  . insulin glargine (LANTUS) injection 20 Units  20 Units Subcutaneous BID Michael [name], MD   20 Units at 09/03/17 2208  . levothyroxine (SYNTHROID, LEVOTHROID) tablet 150 mcg  150 mcg Oral QAC breakfast Michael [name], MD   150 mcg at 09/03/17 0806  . lip balm (CARMEX) ointment 1 application  1 application Topical BID Michael [name], MD   1 application at 57/32/20 2209  . magnesium sulfate IVPB 4 g 100 mL  4 g Intravenous Once [name], MD      . metoCLOPramide (REGLAN) tablet 5 mg  5 mg Oral TID Maebelle Munroe, MD   5 mg at 09/03/17 1648  . polyvinyl alcohol (LIQUIFILM TEARS) 1.4 % ophthalmic solution 1 drop  1 drop Both Eyes Ardeen Fillers, MD   1 drop at 09/03/17 2208  . prenatal multivitamin tablet 1 tablet  1 tablet Oral Daily Michael [name], MD   1 tablet at 09/03/17 1013  . prochlorperazine (COMPAZINE) injection 10 mg  10 mg Intravenous Q4H PRN Barton Dubois, MD   10 mg at  09/04/17 0219  . protein supplement (PREMIER PROTEIN) liquid  11 oz Oral BID BM Michael [name], MD   11 oz at 09/03/17 2209  . psyllium (HYDROCIL/METAMUCIL) packet 1 packet  1 packet Oral Daily Michael [name], MD   1 packet at 09/03/17 1014  . rosuvastatin (CRESTOR) tablet 10 mg  10 mg Oral QPM Michael [name], MD   10 mg at 09/03/17 1700  . sodium chloride flush (NS) 0.9 % injection 10-40 mL  10-40 mL Intracatheter PRN Michael [name], MD   10 mL at 09/03/17 0459  . Tbo-Filgrastim (GRANIX) injection 480 mcg  480 mcg Subcutaneous Weekly [name], MD   480 mcg at 09/03/17 1701   Facility-Administered Medications Ordered in Other Encounters  Medication Dose Route Frequency Provider Last Rate Last Dose  . lactated ringers bolus 2,000 mL  2,000 mL Intravenous Once Michael [name], MD       Followed by  . lactated ringers infusion   Intravenous Continuous Michael [name], MD      .  lactated ringers bolus 2,000 mL  2,000 mL Intravenous Once Michael [name], MD       Followed by  . lactated ringers infusion   Intravenous Continuous Michael [name], MD      . lactated ringers infusion   Intravenous Continuous Michael [name], MD      . ondansetron (ZOFRAN-ODT) disintegrating tablet 4 mg  4 mg Oral Q4H PRN Michael [name], MD       Or  . ondansetron (ZOFRAN) 4 mg in sodium chloride 0.9 % 50 mL IVPB  4 mg Intravenous Q4H PRN Michael [name], MD      . ondansetron (ZOFRAN-ODT) disintegrating tablet 4 mg  4 mg Oral Q4H PRN Michael [name], MD       Or  . ondansetron (ZOFRAN) 4 mg in sodium chloride 0.9 % 50 mL IVPB  4 mg Intravenous Q4H PRN Michael [name], MD      . ondansetron (ZOFRAN-ODT) disintegrating tablet 4 mg  4 mg Oral Q4H PRN Michael [name], MD       Or  . ondansetron (ZOFRAN) 4 mg in sodium chloride 0.9 % 50 mL IVPB  4 mg Intravenous Q4H PRN Michael [name], MD         Objective: Vital signs in last 24 hours: Temp:  [98.9 F (37.2 C)-99.7 F (37.6 C)] 98.9 F (37.2 C) (02/03 0532) Pulse Rate:  [91-116] 91 (02/03 0532) Resp:  [16] 16 (02/03 0532) BP: (117-124)/(50-54) 124/50 (02/03 0532) SpO2:  [100 %] 100 % (02/03 0532) Weight:  [94.1 kg (207 lb 7.3 oz)] 94.1 kg (207 lb 7.3 oz) (02/03 0500)  Intake/Output from previous day: 02/02 0701 - 02/03 0700 In: 1180 [P.O.:1080; IV Piggyback:100] Out: 5200 [PFXTK:2409; BDZHG:9924] Intake/Output this shift: No intake/output data recorded.   Physical Exam  Constitutional: She is well-developed, well-nourished, and in no distress.  Abdominal:  Bilateral NT's draining clear urine.   Genitourinary:  Genitourinary Comments: Foley bag is emptying.   Vitals reviewed.   Lab Results:  Recent Labs    09/03/17 0455 09/04/17 0416  WBC 4.4 13.6*  HGB 8.2* 8.6*  HCT 24.9* 26.7*  PLT 221 268   BMET Recent Labs    09/03/17 0455 09/04/17 0416  NA 132* 132*  K 3.6 3.7  CL 99* 101  CO2 23 23  GLUCOSE 187* 209*  BUN 56*  55*  CREATININE 3.59* 3.61*  CALCIUM 8.5* 8.4*   PT/INR Recent Labs    09/02/17 0939  LABPROT  15.2  INR 1.21   ABG No results for input(s): PHART, HCO3 in the last 72 hours.  Invalid input(s): PCO2, PO2  Studies/Results: Ir Nephrostogram Left Initial Placement  Result Date: 09/02/2017 INDICATION: 62 year old female with a history bilateral ureteral obstruction. EXAM: IR NEPHROSTOGRAM INI PLACEMENT RIGHT; IR NEPHROSTOGRAM INI PLACEMENT LEFT COMPARISON:  CT 08/31/2017 MEDICATIONS: Ciprofloxacin; The antibiotic was administered in an appropriate time frame prior to skin puncture. ANESTHESIA/SEDATION: Fentanyl 100 mcg IV; Versed 1.0 mg IV Moderate Sedation Time:  27 minutes The patient was continuously monitored during the procedure by the interventional radiology nurse under my direct supervision. CONTRAST:  20 cc-administered into the collecting system(s) FLUOROSCOPY TIME:  Fluoroscopy Time: 1 minutes 30 seconds (32 mGy). COMPLICATIONS: None PROCEDURE: Informed written consent was obtained from the patient after a thorough discussion of the procedural risks, benefits and alternatives. All questions were addressed. Maximal Sterile Barrier Technique was utilized including caps, mask, sterile gowns, sterile gloves, sterile drape, hand hygiene and skin antiseptic. A timeout was performed prior to the initiation of the procedure. Patient positioned prone position on the fluoroscopy table. The patient was then prepped and draped in the usual sterile fashion. 1% lidocaine was used to anesthetize the skin and subcutaneous tissues for local anesthesia. Left: Ultrasound survey of the left flank was performed with images stored and sent to PACs. A Chiba needle was then used to access a posterior inferior calyx with ultrasound guidance. With spontaneous urine returned through the needle, passage of an 018 micro wire into the collecting system was performed under fluoroscopy. A small incision was made with an 11  blade scalpel, and the needle was removed from the wire. An Accustick system was then advanced over the wire into the collecting system under fluoroscopy. The metal stiffener and inner dilator were removed, and then a sample of fluid was aspirated through the 4 French outer sheath. Bentson wire was passed into the collecting system and the sheath removed. Ten French dilation of the soft tissues was performed. Using modified Seldinger technique, a 10 French pigtail catheter drain was placed over the Bentson wire. Wire and inner stiffener removed, and the pigtail was formed in the collecting system. Small amount of contrast confirmed position of the catheter. Right: Ultrasound survey of the right flank was performed with images stored and sent to PACs. A Chiba needle was then used to access a posterior inferior calyx with ultrasound guidance. With spontaneous urine returned through the needle, passage of an 018 micro wire into the collecting system was performed under fluoroscopy. A small incision was made with an 11 blade scalpel, and the needle was removed from the wire. An Accustick system was then advanced over the wire into the collecting system under fluoroscopy. The metal stiffener and inner dilator were removed, and then a sample of fluid was aspirated through the 4 French outer sheath. Bentson wire was passed into the collecting system and the sheath removed. Ten French dilation of the soft tissues was performed. Using modified Seldinger technique, a 10 French pigtail catheter drain was placed over the Bentson wire. Wire and inner stiffener removed, and the pigtail was formed in the collecting system. Small amount of contrast confirmed position of the catheter. Patient tolerated the procedure well and remained hemodynamically stable throughout. No complications were encountered and no significant blood loss encountered IMPRESSION: Status post bilateral percutaneous nephrostomy. Signed, Dulcy Fanny. Earleen Newport, DO  [facility] Radiology Electronically Signed   By: Corrie Mckusick D.O.  On: 09/02/2017 17:32   Ir Nephrostogram Right Initial Placement  Result Date: 09/02/2017 INDICATION: 62 year old female with a history bilateral ureteral obstruction. EXAM: IR NEPHROSTOGRAM INI PLACEMENT RIGHT; IR NEPHROSTOGRAM INI PLACEMENT LEFT COMPARISON:  CT 08/31/2017 MEDICATIONS: Ciprofloxacin; The antibiotic was administered in an appropriate time frame prior to skin puncture. ANESTHESIA/SEDATION: Fentanyl 100 mcg IV; Versed 1.0 mg IV Moderate Sedation Time:  27 minutes The patient was continuously monitored during the procedure by the interventional radiology nurse under my direct supervision. CONTRAST:  20 cc-administered into the collecting system(s) FLUOROSCOPY TIME:  Fluoroscopy Time: 1 minutes 30 seconds (32 mGy). COMPLICATIONS: None PROCEDURE: Informed written consent was obtained from the patient after a thorough discussion of the procedural risks, benefits and alternatives. All questions were addressed. Maximal Sterile Barrier Technique was utilized including caps, mask, sterile gowns, sterile gloves, sterile drape, hand hygiene and skin antiseptic. A timeout was performed prior to the initiation of the procedure. Patient positioned prone position on the fluoroscopy table. The patient was then prepped and draped in the usual sterile fashion. 1% lidocaine was used to anesthetize the skin and subcutaneous tissues for local anesthesia. Left: Ultrasound survey of the left flank was performed with images stored and sent to PACs. A Chiba needle was then used to access a posterior inferior calyx with ultrasound guidance. With spontaneous urine returned through the needle, passage of an 018 micro wire into the collecting system was performed under fluoroscopy. A small incision was made with an 11 blade scalpel, and the needle was removed from the wire. An Accustick system was then  advanced over the wire into the collecting system under fluoroscopy. The metal stiffener and inner dilator were removed, and then a sample of fluid was aspirated through the 4 French outer sheath. Bentson wire was passed into the collecting system and the sheath removed. Ten French dilation of the soft tissues was performed. Using modified Seldinger technique, a 10 French pigtail catheter drain was placed over the Bentson wire. Wire and inner stiffener removed, and the pigtail was formed in the collecting system. Small amount of contrast confirmed position of the catheter. Right: Ultrasound survey of the right flank was performed with images stored and sent to PACs. A Chiba needle was then used to access a posterior inferior calyx with ultrasound guidance. With spontaneous urine returned through the needle, passage of an 018 micro wire into the collecting system was performed under fluoroscopy. A small incision was made with an 11 blade scalpel, and the needle was removed from the wire. An Accustick system was then advanced over the wire into the collecting system under fluoroscopy. The metal stiffener and inner dilator were removed, and then a sample of fluid was aspirated through the 4 French outer sheath. Bentson wire was passed into the collecting system and the sheath removed. Ten French dilation of the soft tissues was performed. Using modified Seldinger technique, a 10 French pigtail catheter drain was placed over the Bentson wire. Wire and inner stiffener removed, and the pigtail was formed in the collecting system. Small amount of contrast confirmed position of the catheter. Patient tolerated the procedure well and remained hemodynamically stable throughout. No complications were encountered and no significant blood loss encountered IMPRESSION: Status post bilateral percutaneous nephrostomy. Signed, Dulcy Fanny. Earleen Newport, DO [facility] Radiology Electronically  Signed   By: Corrie Mckusick D.O.   On: 09/02/2017 17:32    Labs reviewed.     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  There is still some rectal drainage and up until earlier today significant UOP from the foley as well as the NT's.  Leave foley to drainage for now.      LOS: 6 days    Irine Seal 09/04/2017 [phone] ID: Doretha Imus, female   DOB: Apr 01, 1956, 62 y.o.   MRN: [id number]

## 2017-09-04 NOTE — Progress Notes (Signed)
Subjective/Chief Complaint: Eating breakfast Pain from nephrostomy tubes   Objective: Vital signs in last 24 hours: Temp:  [98.9 F (37.2 C)-99.7 F (37.6 C)] 98.9 F (37.2 C) (02/03 0532) Pulse Rate:  [91-116] 91 (02/03 0532) Resp:  [16] 16 (02/03 0532) BP: (117-124)/(50-54) 124/50 (02/03 0532) SpO2:  [100 %] 100 % (02/03 0532) Weight:  [94.1 kg (207 lb 7.3 oz)] 94.1 kg (207 lb 7.3 oz) (02/03 0500) Last BM Date: 09/03/17  Intake/Output from previous day: 02/02 0701 - 02/03 0700 In: 1180 [P.O.:1080; IV Piggyback:100] Out: 5200 [Urine:4175; Stool:1025] Intake/Output this shift: No intake/output data recorded.  General: Pt awake/alert/oriented x4 in no acute distress Psych:  Depressed affect.  HENT: Normocephalic, Mucus membranes moist.  No thrush Chest: No chest wall pain w good excursion CV:  Pulses intact.  Regular rhythm Abdomen: Soft.  Nondistended.  No tenderness to palpation.  No evidence of peritonitis.  No incarcerated hernias.  No wounds.  Ileostomy pink with gas and thick pasty in bag. Bag is leaking laterally GU.  No purulent drainage or hematoma.  Foley in place with clear light yellow urine Nephrostomy tubes with clear yellow urine   Lab Results:  Recent Labs    09/03/17 0455 09/04/17 0416  WBC 4.4 13.6*  HGB 8.2* 8.6*  HCT 24.9* 26.7*  PLT 221 268   BMET Recent Labs    09/03/17 0455 09/04/17 0416  NA 132* 132*  K 3.6 3.7  CL 99* 101  CO2 23 23  GLUCOSE 187* 209*  BUN 56* 55*  CREATININE 3.59* 3.61*  CALCIUM 8.5* 8.4*   PT/INR Recent Labs    09/02/17 0939  LABPROT 15.2  INR 1.21   ABG No results for input(s): PHART, HCO3 in the last 72 hours.  Invalid input(s): PCO2, PO2  Studies/Results: Ir Nephrostogram Left Initial Placement  Result Date: 09/02/2017 INDICATION: 62 year old female with a history bilateral ureteral obstruction. EXAM: IR NEPHROSTOGRAM INI PLACEMENT RIGHT; IR NEPHROSTOGRAM INI PLACEMENT LEFT COMPARISON:  CT  08/31/2017 MEDICATIONS: Ciprofloxacin; The antibiotic was administered in an appropriate time frame prior to skin puncture. ANESTHESIA/SEDATION: Fentanyl 100 mcg IV; Versed 1.0 mg IV Moderate Sedation Time:  27 minutes The patient was continuously monitored during the procedure by the interventional radiology nurse under my direct supervision. CONTRAST:  20 cc-administered into the collecting system(s) FLUOROSCOPY TIME:  Fluoroscopy Time: 1 minutes 30 seconds (32 mGy). COMPLICATIONS: None PROCEDURE: Informed written consent was obtained from the patient after a thorough discussion of the procedural risks, benefits and alternatives. All questions were addressed. Maximal Sterile Barrier Technique was utilized including caps, mask, sterile gowns, sterile gloves, sterile drape, hand hygiene and skin antiseptic. A timeout was performed prior to the initiation of the procedure. Patient positioned prone position on the fluoroscopy table. The patient was then prepped and draped in the usual sterile fashion. 1% lidocaine was used to anesthetize the skin and subcutaneous tissues for local anesthesia. Left: Ultrasound survey of the left flank was performed with images stored and sent to PACs. A Chiba needle was then used to access a posterior inferior calyx with ultrasound guidance. With spontaneous urine returned through the needle, passage of an 018 micro wire into the collecting system was performed under fluoroscopy. A small incision was made with an 11 blade scalpel, and the needle was removed from the wire. An Accustick system was then advanced over the wire into the collecting system under fluoroscopy. The metal stiffener and inner dilator were removed, and then a sample of fluid  was aspirated through the 4 French outer sheath. Bentson wire was passed into the collecting system and the sheath removed. Ten French dilation of the soft tissues was performed. Using modified Seldinger technique, a 10 French pigtail catheter  drain was placed over the Bentson wire. Wire and inner stiffener removed, and the pigtail was formed in the collecting system. Small amount of contrast confirmed position of the catheter. Right: Ultrasound survey of the right flank was performed with images stored and sent to PACs. A Chiba needle was then used to access a posterior inferior calyx with ultrasound guidance. With spontaneous urine returned through the needle, passage of an 018 micro wire into the collecting system was performed under fluoroscopy. A small incision was made with an 11 blade scalpel, and the needle was removed from the wire. An Accustick system was then advanced over the wire into the collecting system under fluoroscopy. The metal stiffener and inner dilator were removed, and then a sample of fluid was aspirated through the 4 French outer sheath. Bentson wire was passed into the collecting system and the sheath removed. Ten French dilation of the soft tissues was performed. Using modified Seldinger technique, a 10 French pigtail catheter drain was placed over the Bentson wire. Wire and inner stiffener removed, and the pigtail was formed in the collecting system. Small amount of contrast confirmed position of the catheter. Patient tolerated the procedure well and remained hemodynamically stable throughout. No complications were encountered and no significant blood loss encountered IMPRESSION: Status post bilateral percutaneous nephrostomy. Signed, Dulcy Fanny. Earleen Newport, DO [facility] Electronically Signed   By: Corrie Mckusick D.O.   On: 09/02/2017 17:32   Ir Nephrostogram Right Initial Placement  Result Date: 09/02/2017 INDICATION: 62 year old female with a history bilateral ureteral obstruction. EXAM: IR NEPHROSTOGRAM INI PLACEMENT RIGHT; IR NEPHROSTOGRAM INI PLACEMENT LEFT COMPARISON:  CT 08/31/2017 MEDICATIONS: Ciprofloxacin; The antibiotic was administered in an appropriate time  frame prior to skin puncture. ANESTHESIA/SEDATION: Fentanyl 100 mcg IV; Versed 1.0 mg IV Moderate Sedation Time:  27 minutes The patient was continuously monitored during the procedure by the interventional radiology nurse under my direct supervision. CONTRAST:  20 cc-administered into the collecting system(s) FLUOROSCOPY TIME:  Fluoroscopy Time: 1 minutes 30 seconds (32 mGy). COMPLICATIONS: None PROCEDURE: Informed written consent was obtained from the patient after a thorough discussion of the procedural risks, benefits and alternatives. All questions were addressed. Maximal Sterile Barrier Technique was utilized including caps, mask, sterile gowns, sterile gloves, sterile drape, hand hygiene and skin antiseptic. A timeout was performed prior to the initiation of the procedure. Patient positioned prone position on the fluoroscopy table. The patient was then prepped and draped in the usual sterile fashion. 1% lidocaine was used to anesthetize the skin and subcutaneous tissues for local anesthesia. Left: Ultrasound survey of the left flank was performed with images stored and sent to PACs. A Chiba needle was then used to access a posterior inferior calyx with ultrasound guidance. With spontaneous urine returned through the needle, passage of an 018 micro wire into the collecting system was performed under fluoroscopy. A small incision was made with an 11 blade scalpel, and the needle was removed from the wire. An Accustick system was then advanced over the wire into the collecting system under fluoroscopy. The metal stiffener and inner dilator were removed, and then a sample of fluid was aspirated through the 4 French outer sheath. Bentson wire was passed into the collecting system and the sheath removed.  Ten French dilation of the soft tissues was performed. Using modified Seldinger technique, a 10 French pigtail catheter drain was placed over the Bentson wire. Wire and inner stiffener removed, and the pigtail was  formed in the collecting system. Small amount of contrast confirmed position of the catheter. Right: Ultrasound survey of the right flank was performed with images stored and sent to PACs. A Chiba needle was then used to access a posterior inferior calyx with ultrasound guidance. With spontaneous urine returned through the needle, passage of an 018 micro wire into the collecting system was performed under fluoroscopy. A small incision was made with an 11 blade scalpel, and the needle was removed from the wire. An Accustick system was then advanced over the wire into the collecting system under fluoroscopy. The metal stiffener and inner dilator were removed, and then a sample of fluid was aspirated through the 4 French outer sheath. Bentson wire was passed into the collecting system and the sheath removed. Ten French dilation of the soft tissues was performed. Using modified Seldinger technique, a 10 French pigtail catheter drain was placed over the Bentson wire. Wire and inner stiffener removed, and the pigtail was formed in the collecting system. Small amount of contrast confirmed position of the catheter. Patient tolerated the procedure well and remained hemodynamically stable throughout. No complications were encountered and no significant blood loss encountered IMPRESSION: Status post bilateral percutaneous nephrostomy. Signed, Dulcy Fanny. Earleen Newport, DO [facility] Electronically Signed   By: Corrie Mckusick D.O.   On: 09/02/2017 17:32    Anti-infectives: Anti-infectives (From admission, onward)   Start     Dose/Rate Route Frequency Ordered Stop   08/30/17 1000  ciprofloxacin (CIPRO) IVPB 200 mg     200 mg 100 mL/hr over 60 Minutes Intravenous Every 24 hours 08/30/17 0855        Assessment/Plan: Problem List:   Principal Problem:   Colovesical fistula - delayed Active Problems:   Primary hypothyroidism   DM type 2 (diabetes mellitus, type 2)  ([place])   History of ovarian & endometrial cancer   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 s/p resection/partial vaginectomy/ LAR/colostomy 11/19/2014   Morbid obesity (HCC)   Ureteral stricture, right, 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) 04/01/2017   Pelvic cancer s/p colostomy takedown/loop ileostomy diversion 04/01/2017   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   Protein-calorie malnutrition, moderate (HCC)   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HCC)   Chronic kidney disease (CKD), stage III (moderate) (HCC)   Dehydration   ARF (acute renal failure) (HCC)   Adjustment disorder with mixed anxiety and depressed mood   Poorly controlled diabetes mellitus (HCC)   Pressure injury of skin   History of external beam radiation therapy to pelvis  SURGERY 04/01/2017     POST-OPERATIVE DIAGNOSIS:   Recurrent endometrial cancer status post low anterior resection and partial pelvic exeneration  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   SURGEON:Steven Gwynneth Aliment, MD- Primary (Dr Tresa Moore assist) PROCEDURE:  XI ROBOTIC LYSIS OF ADHESIONS x 5 hours XI ROBOTIC COLOSTOMY TAKEDOWN ROBOTIC SEWN COLOANAL ANASTOMOSIS Ileocecal resection with anastomosis Diverting loop ileostomy  SURGEON: Alexis Frock, MD - Primary (Dr Johney Maine assist) PROCEDURE:  Cystoscopy with R retrograde and stent exchange.  Robotic R ureteral reimplant with psoas bladder hitch.  Consults:urology   SURGERY 08/17/2017  DIAGNOSIS: Right ureteral stricture, status post complex reconstruction.  PROCEDURES: 1. Cystoscopy with right retrograde pyelogram. 2. Removal of right ureteral stent.  PHYSICIAN: Hubbard Robinson  Manny, MD    SPECIMENS: Right ureteral stone for discard, inspected and intact.  FINDINGS: 1. Fusiform bladder shape consistent with prior psoas hitch. 2. Visibly patent ureteral-bladder anastomosis. 3. No evidence of hydronephrosis or narrowing with  right retrograde pyelogram.    Assessment  FAIR  Ileus from bladder leak resolving.  Delayed bladder leak turned into colovesical fistula with preferential urinary drainage in decompressed diverted colon limb.  Plan:   Perc nephrostomies.  Creatinine stable. Serratia in urine  Zoloft for depression.  Solid diet with supp shakes.  Thankfully, her nausea vomiting and ileus seem to be resolving. Calorie counts.  Ate a good portion of breakfast.    -Ostomy nurse consultation for ileostomy bag   Ileus resolving.  Restart antidiarrheal regimen and fiber to help control things.  Stool consistency seems thicker  -Continue to monitor lytes  -Hemoglobin low but stable in pancytopenic pt  -Continue neupogen injections for pancytopenia  -Continue Rheumatex PO  -VTE prophylaxis: SCDs, etc  -Ambulate as tolerated to help recovery    LOS: 6 days    [name] 09/04/2017

## 2017-09-04 NOTE — Progress Notes (Signed)
Calorie Count Note  48 hour calorie count ordered. Day 1 results below.  Diet: Heart Healthy  Supplements: Premier Protein BID, each provides 160 kcal and 30g protein  2/2: Breakfast: 500 kcal, 30g protein Lunch: 330 kcal, 11g protein Dinner: 150 kcal, 3g protein Supplements: 160 kcal, 30g protein  Estimated Nutritional Needs:  Kcal:  1650-1850 Protein:  65-75g  Total intake: 1140 kcal (69% of minimum estimated needs)   74g protein (100% of minimum estimated needs)  Nutrition Dx:  Increased nutrient needs related to post-op healing as evidenced by estimated needs.  Goal: Pt to meet >/= 90% of their estimated nutrition needs   Intervention:  -Continue Calorie Count -Continue Premier Protein BID, each supplement provides 160 kcal and 30 grams of protein.    Clayton Bibles, MS, RD, [place] Dietitian Pager: [phone] After Hours Pager: [phone]

## 2017-09-04 NOTE — Progress Notes (Signed)
Patient ID: [name], female   DOB: February 13, 1956, 62 y.o.   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 She underwent a prolonged procedure 04/01/2017 with colostomy takedown, ileocecal resection with coloanal anastomosis and diverting loop ileostomy, 5 hours of LOA, and right ureteral stent exchange with psoas bladder hitch. Since that time she reported significant ileostomy output and was felt not to be keeping up with per oral intake. Had ureteral stent removed Jan 2019, creatinine noted to be up to 3 from baseline of 1. Had been given IVF's as outpatient, but labs showed acute renal failure with creatinine 4.28, so patient admitted by surgery on 1/28. Nephrology, urology, and hospitalists consulted. CT showed new colovesical fistula.      Assessment & Plan:   Principal Problem:   Colovesical fistula - delayed Active Problems:   Primary hypothyroidism   DM type 2 (diabetes mellitus, type 2) ([place])   History of ovarian & endometrial cancer   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 s/p resection/partial vaginectomy/ LAR/colostomy 11/19/2014   Morbid obesity (HCC)   Ureteral stricture, right, 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) 04/01/2017   Pelvic cancer s/p colostomy takedown/loop ileostomy diversion 04/01/2017   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   Protein-calorie malnutrition, moderate (HCC)   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HCC)   Chronic kidney disease (CKD), stage III (moderate) (HCC)   Dehydration   ARF (acute renal failure) (HCC)   Adjustment disorder with mixed anxiety and depressed mood   Poorly controlled diabetes mellitus  (HCC)   Pressure injury of skin   History of external beam radiation therapy to pelvis   #1 acute on chronic renal failure: Patient has chronic kidney disease stage II.  Renal function appears to be stabilizing.  Suspected prerenal etiology.  Continue according to nephrology  #2 rectal vesica fistula: Status post bilateral PCN's.  Continue prior surgery  #3 recurrent endometrial cancer: Patient is status post resection was adjuvant radiation therapy.  She does have partial vaginectomy with colostomy.  Patient is ostomy drainage is high but slowing down.  No acute chemotherapy  #4 chronic idiopathic neutropenia: White count has stabilized.  Continue follow-up by hematology/oncology  #5 and deficiency anemia: hemoglobin appears stable after transfusion of 1 unit packed red blood cells.  Continue monitoring H&H  #6 history of breast cancer: Patient is status post bilateral mastectomies.  Continue Arimidex  #7 UTI: Polymicrobial.  On IV ciprofloxacin.  Continue ventricular  #8 diabetes: Continue Lantus and sliding scale insulin.  Continue close monitoring  #9 moderate protein calorie malnutrition: Continue to improve nutritional intake.  #10 depression with anxiety: Counseling provided.  Continue Zoloft  DVT prophylaxis: low dose Lovenox  Code Status: Full  Family Communication: None available  Disposition Plan:To be determined  Consultants:   Hospitalists  Nephrology  Urology  Procedures:   IR placement bilat PCN on 09/02/17  Antimicrobials:  Ciprofloxacin 1/29 >>    Subjective: Patient has no new complaints but is depressed.  She is having more urine output.  Slight pain in her flanks  Objective: Vitals:   09/03/17 0555 09/03/17 2204 09/04/17 0500 09/04/17 0532  BP: 134/62 (!) 117/54  [name] Kitchen)  124/50  Pulse: 83 (!) 116  91  Resp: 16 16  16   Temp: 98.7 F (37.1 C) 99.7 F (37.6 C)  98.9 F (37.2 C)  TempSrc: Oral Oral  Oral  SpO2: 100% 100%  100%  Weight: 94  kg (207 lb 3.7 oz)  94.1 kg (207 lb 7.3 oz)   Height:        Intake/Output Summary (Last 24 hours) at 09/04/2017 1131 Last data filed at 09/04/2017 1000 Gross per 24 hour  Intake 1080 ml  Output 4915 ml  Net -3835 ml   Filed Weights   08/30/17 0552 09/03/17 0555 09/04/17 0500  Weight: 82.9 kg (182 lb 12.2 oz) 94 kg (207 lb 3.7 oz) 94.1 kg (207 lb 7.3 oz)    Examination:  General exam: Appears calm and comfortable  Respiratory system: Clear to auscultation. Respiratory effort normal. Cardiovascular system: S1 & S2 heard, RRR. No JVD, murmurs, rubs, gallops or clicks. No pedal edema. Gastrointestinal system: Abdomen is nondistended, soft and nontender. No organomegaly or masses felt. Normal bowel sounds heard. Central nervous system: Alert and oriented. No focal neurological deficits. Extremities: Symmetric 5 x 5 power. Skin: No rashes, lesions or ulcers Psychiatry: Judgement and insight appear normal. Mood & affect appropriate.     Data Reviewed: I have personally reviewed following labs and imaging studies  CBC: Recent Labs  Lab 08/30/17 0623 08/31/17 0451 09/01/17 0406 09/02/17 0334 09/03/17 0455 09/04/17 0416  WBC 2.8* 1.7* 4.3 5.7 4.4 13.6*  NEUTROABS 1.8  --  3.1  --  3.3 11.8*  HGB 9.4* 7.9* 8.4* 8.1* 8.2* 8.6*  HCT 26.8* 22.5* 24.5* 23.3* 24.9* 26.7*  MCV 84.3 83.6 85.4 86.0 88.9 90.5  PLT 222 204 200 184 221 568   Basic Metabolic Panel: Recent Labs  Lab 08/31/17 0451 09/01/17 0406 09/02/17 0334 09/03/17 0455 09/04/17 0416  NA 128* 126* 125* 132* 132*  K 3.6 3.0* 3.3* 3.6 3.7  CL 105 98* 94* 99* 101  CO2 15* 19* 22 23 23   GLUCOSE 221* 306* 357* 187* 209*  BUN 69* 61* 59* 56* 55*  CREATININE 3.60* 3.46* 3.61* 3.59* 3.61*  CALCIUM 8.1* 7.6* 7.6* 8.5* 8.4*  MG 1.3* 2.0 1.5* 1.9 1.3*  PHOS 2.0* 2.5 2.5 3.8 3.8   GFR: Estimated Creatinine Clearance: 17.4 mL/min (A) (by C-G formula based on SCr of 3.61 mg/dL (H)). Liver Function Tests: Recent Labs    Lab 08/29/17 1550 08/31/17 0451 09/01/17 0406 09/02/17 0334 09/03/17 0455 09/04/17 0416  AST 16  --   --   --   --   --   ALT 24  --   --   --   --   --   ALKPHOS 173*  --   --   --   --   --   BILITOT 0.5  --   --   --   --   --   PROT 6.8  --   --   --   --   --   ALBUMIN 2.1* 1.7* 1.6* 1.4* 1.9* 1.8*   No results for input(s): LIPASE, AMYLASE in the last 168 hours. No results for input(s): AMMONIA in the last 168 hours. Coagulation Profile: Recent Labs  Lab 09/02/17 0939  INR 1.21   Cardiac Enzymes: No results for input(s): CKTOTAL, CKMB, CKMBINDEX, TROPONINI in the last 168 hours. BNP (last 3 results) No results for input(s): PROBNP in the last 8760 hours. HbA1C: No results for input(s): HGBA1C in the  last 72 hours. CBG: Recent Labs  Lab 09/03/17 0803 09/03/17 1216 09/03/17 1651 09/03/17 2147 09/04/17 0743  GLUCAP 142* 304* 162* 215* 162*   Lipid Profile: No results for input(s): CHOL, HDL, LDLCALC, TRIG, CHOLHDL, LDLDIRECT in the last 72 hours. Thyroid Function Tests: No results for input(s): TSH, T4TOTAL, FREET4, T3FREE, THYROIDAB in the last 72 hours. Anemia Panel: No results for input(s): VITAMINB12, FOLATE, FERRITIN, TIBC, IRON, RETICCTPCT in the last 72 hours. Urine analysis:    Component Value Date/Time   COLORURINE YELLOW 08/30/2017 1240   APPEARANCEUR CLOUDY (A) 08/30/2017 1240   LABSPEC 1.006 08/30/2017 1240   LABSPEC 1.015 11/17/2015 1436   PHURINE 5.0 08/30/2017 1240   GLUCOSEU NEGATIVE 08/30/2017 1240   GLUCOSEU 2,000 11/17/2015 1436   HGBUR MODERATE (A) 08/30/2017 1240   BILIRUBINUR NEGATIVE 08/30/2017 1240   BILIRUBINUR neg 08/30/2016 1025   BILIRUBINUR Negative 11/17/2015 1436   KETONESUR NEGATIVE 08/30/2017 1240   PROTEINUR 100 (A) 08/30/2017 1240   UROBILINOGEN negative 08/30/2016 1025   UROBILINOGEN 0.2 11/17/2015 1436   NITRITE NEGATIVE 08/30/2017 1240   LEUKOCYTESUR LARGE (A) 08/30/2017 1240   LEUKOCYTESUR Moderate 11/17/2015  1436   Sepsis Labs: @LABRCNTIP (procalcitonin:4,lacticidven:4)  ) Recent Results (from the past 240 hour(s))  Culture, Urine     Status: Abnormal   Collection Time: 08/30/17 10:10 AM  Result Value Ref Range Status   Specimen Description URINE, RANDOM  Final   Special Requests NONE  Final   Culture (A)  Final    >=100,000 COLONIES/mL SERRATIA MARCESCENS 80,000 COLONIES/mL GROUP B STREP(S.AGALACTIAE)ISOLATED TESTING AGAINST S. AGALACTIAE NOT ROUTINELY PERFORMED DUE TO PREDICTABILITY OF AMP/PEN/VAN SUSCEPTIBILITY. Performed at [facility], [place] [address], [place], [place] [address]    Report Status 09/01/2017 FINAL  Final   Organism ID, Bacteria SERRATIA MARCESCENS (A)  Final      Susceptibility   Serratia marcescens - MIC*    CEFAZOLIN >=64 RESISTANT Resistant     CEFTRIAXONE <=1 SENSITIVE Sensitive     CIPROFLOXACIN 1 SENSITIVE Sensitive     GENTAMICIN <=1 SENSITIVE Sensitive     NITROFURANTOIN 256 RESISTANT Resistant     TRIMETH/SULFA <=20 SENSITIVE Sensitive     * >=100,000 COLONIES/mL SERRATIA MARCESCENS  Culture, blood (Routine X 2) w Reflex to ID Panel     Status: None (Preliminary result)   Collection Time: 08/30/17  3:26 PM  Result Value Ref Range Status   Specimen Description   Final    BLOOD LEFT HAND Performed at [facility], [place] [address], [place], [place] [address]    Special Requests   Final    IN PEDIATRIC BOTTLE Blood Culture adequate volume Performed at [place] [address], [place], [place] [address]    Culture   Final    NO GROWTH 4 DAYS Performed at [facility], [place] [address], [place], [place] [address]    Report Status PENDING  Incomplete  Culture, blood (Routine X 2) w Reflex to ID Panel     Status: None (Preliminary result)   Collection Time: 08/30/17  3:30 PM  Result Value Ref Range Status   Specimen Description   Final    BLOOD LEFT ANTECUBITAL Performed at [facility]  [address], [place], [place] [address]    Special Requests   Final    IN PEDIATRIC BOTTLE Blood Culture adequate volume Performed at [place] [address], [place], [place] [address]  Culture   Final    NO GROWTH 4 DAYS Performed at [facility], [place] [address] [place], [place] [address]    Report Status PENDING  Incomplete  Culture, Urine     Status: Abnormal   Collection Time: 08/31/17  6:10 PM  Result Value Ref Range Status   Specimen Description   Final    URINE, CATHETERIZED Performed at [place] [address], [place], [place] [address]    Special Requests   Final    NONE Performed at [facility], [place] [address] [place], [place] [address]    Culture >=100,000 COLONIES/mL SERRATIA MARCESCENS (A)  Final   Report Status 09/03/2017 FINAL  Final   Organism ID, Bacteria SERRATIA MARCESCENS (A)  Final      Susceptibility   Serratia marcescens - MIC*    CEFAZOLIN >=64 RESISTANT Resistant     CEFTRIAXONE <=1 SENSITIVE Sensitive     CIPROFLOXACIN 1 SENSITIVE Sensitive     GENTAMICIN <=1 SENSITIVE Sensitive     NITROFURANTOIN 256 RESISTANT Resistant     TRIMETH/SULFA <=20 SENSITIVE Sensitive     * >=100,000 COLONIES/mL SERRATIA MARCESCENS  Urine Culture     Status: Abnormal (Preliminary result)   Collection Time: 09/02/17  5:07 PM  Result Value Ref Range Status   Specimen Description   Final    URINE, RANDOM KIDNEY RIGHT Performed at [facility], [place] [address] [place], [place] [address]    Special Requests   Final    NONE Performed at [facility], [place] [address], [place], [place] [address]    Culture (A)  Final    40,000 COLONIES/mL SERRATIA MARCESCENS SUSCEPTIBILITIES PERFORMED ON PREVIOUS CULTURE WITHIN THE LAST 5 DAYS. CULTURE REINCUBATED FOR BETTER GROWTH Performed at [facility], [place] [address] [place], [place] [address]    Report Status  PENDING  Incomplete  Urine Culture     Status: Abnormal (Preliminary result)   Collection Time: 09/02/17  5:07 PM  Result Value Ref Range Status   Specimen Description URINE, RANDOM  Final   Special Requests NONE  Final   Culture (A)  Final    80,000 COLONIES/mL SERRATIA MARCESCENS CULTURE REINCUBATED FOR BETTER GROWTH Performed at [facility], [place] [address] [place],  [address]    Report Status PENDING  Incomplete   Organism ID, Bacteria SERRATIA MARCESCENS (A)  Final      Susceptibility   Serratia marcescens - MIC*    CEFAZOLIN >=64 RESISTANT Resistant     CEFTRIAXONE <=1 SENSITIVE Sensitive     CIPROFLOXACIN 1 SENSITIVE Sensitive     GENTAMICIN <=1 SENSITIVE Sensitive     NITROFURANTOIN 256 RESISTANT Resistant     TRIMETH/SULFA <=20 SENSITIVE Sensitive     * 80,000 COLONIES/mL SERRATIA MARCESCENS         Radiology Studies: Ir Nephrostogram Left Initial Placement  Result Date: 09/02/2017 INDICATION: 62 year old female with a history bilateral ureteral obstruction. EXAM: IR NEPHROSTOGRAM INI PLACEMENT RIGHT; IR NEPHROSTOGRAM INI PLACEMENT LEFT COMPARISON:  CT 08/31/2017 MEDICATIONS: Ciprofloxacin; The antibiotic was administered in an appropriate time frame prior to skin puncture. ANESTHESIA/SEDATION: Fentanyl 100 mcg IV; Versed 1.0 mg IV Moderate Sedation Time:  27 minutes The patient was continuously monitored during the procedure by the interventional radiology nurse under my direct supervision. CONTRAST:  20 cc-administered into the collecting system(s) FLUOROSCOPY TIME:  Fluoroscopy Time: 1 minutes 30 seconds (32 mGy). COMPLICATIONS: None PROCEDURE: Informed  written consent was obtained from the patient after a thorough discussion of the procedural risks, benefits and alternatives. All questions were addressed. Maximal Sterile Barrier Technique was utilized including caps, mask, sterile gowns, sterile gloves, sterile drape, hand hygiene and skin antiseptic. A  timeout was performed prior to the initiation of the procedure. Patient positioned prone position on the fluoroscopy table. The patient was then prepped and draped in the usual sterile fashion. 1% lidocaine was used to anesthetize the skin and subcutaneous tissues for local anesthesia. Left: Ultrasound survey of the left flank was performed with images stored and sent to PACs. A Chiba needle was then used to access a posterior inferior calyx with ultrasound guidance. With spontaneous urine returned through the needle, passage of an 018 micro wire into the collecting system was performed under fluoroscopy. A small incision was made with an 11 blade scalpel, and the needle was removed from the wire. An Accustick system was then advanced over the wire into the collecting system under fluoroscopy. The metal stiffener and inner dilator were removed, and then a sample of fluid was aspirated through the 4 French outer sheath. Bentson wire was passed into the collecting system and the sheath removed. Ten French dilation of the soft tissues was performed. Using modified Seldinger technique, a 10 French pigtail catheter drain was placed over the Bentson wire. Wire and inner stiffener removed, and the pigtail was formed in the collecting system. Small amount of contrast confirmed position of the catheter. Right: Ultrasound survey of the right flank was performed with images stored and sent to PACs. A Chiba needle was then used to access a posterior inferior calyx with ultrasound guidance. With spontaneous urine returned through the needle, passage of an 018 micro wire into the collecting system was performed under fluoroscopy. A small incision was made with an 11 blade scalpel, and the needle was removed from the wire. An Accustick system was then advanced over the wire into the collecting system under fluoroscopy. The metal stiffener and inner dilator were removed, and then a sample of fluid was aspirated through the 4 French  outer sheath. Bentson wire was passed into the collecting system and the sheath removed. Ten French dilation of the soft tissues was performed. Using modified Seldinger technique, a 10 French pigtail catheter drain was placed over the Bentson wire. Wire and inner stiffener removed, and the pigtail was formed in the collecting system. Small amount of contrast confirmed position of the catheter. Patient tolerated the procedure well and remained hemodynamically stable throughout. No complications were encountered and no significant blood loss encountered IMPRESSION: Status post bilateral percutaneous nephrostomy. Signed, Dulcy Fanny. Earleen Newport, DO [facility] Radiology Electronically Signed   By: Corrie Mckusick D.O.   On: 09/02/2017 17:32   Ir Nephrostogram Right Initial Placement  Result Date: 09/02/2017 INDICATION: 62 year old female with a history bilateral ureteral obstruction. EXAM: IR NEPHROSTOGRAM INI PLACEMENT RIGHT; IR NEPHROSTOGRAM INI PLACEMENT LEFT COMPARISON:  CT 08/31/2017 MEDICATIONS: Ciprofloxacin; The antibiotic was administered in an appropriate time frame prior to skin puncture. ANESTHESIA/SEDATION: Fentanyl 100 mcg IV; Versed 1.0 mg IV Moderate Sedation Time:  27 minutes The patient was continuously monitored during the procedure by the interventional radiology nurse under my direct supervision. CONTRAST:  20 cc-administered into the collecting system(s) FLUOROSCOPY TIME:  Fluoroscopy Time: 1 minutes 30 seconds (32 mGy). COMPLICATIONS: None PROCEDURE: Informed written consent was obtained from the patient after a thorough discussion of the procedural risks, benefits and alternatives. All questions  were addressed. Maximal Sterile Barrier Technique was utilized including caps, mask, sterile gowns, sterile gloves, sterile drape, hand hygiene and skin antiseptic. A timeout was performed prior to the initiation of the procedure. Patient positioned prone  position on the fluoroscopy table. The patient was then prepped and draped in the usual sterile fashion. 1% lidocaine was used to anesthetize the skin and subcutaneous tissues for local anesthesia. Left: Ultrasound survey of the left flank was performed with images stored and sent to PACs. A Chiba needle was then used to access a posterior inferior calyx with ultrasound guidance. With spontaneous urine returned through the needle, passage of an 018 micro wire into the collecting system was performed under fluoroscopy. A small incision was made with an 11 blade scalpel, and the needle was removed from the wire. An Accustick system was then advanced over the wire into the collecting system under fluoroscopy. The metal stiffener and inner dilator were removed, and then a sample of fluid was aspirated through the 4 French outer sheath. Bentson wire was passed into the collecting system and the sheath removed. Ten French dilation of the soft tissues was performed. Using modified Seldinger technique, a 10 French pigtail catheter drain was placed over the Bentson wire. Wire and inner stiffener removed, and the pigtail was formed in the collecting system. Small amount of contrast confirmed position of the catheter. Right: Ultrasound survey of the right flank was performed with images stored and sent to PACs. A Chiba needle was then used to access a posterior inferior calyx with ultrasound guidance. With spontaneous urine returned through the needle, passage of an 018 micro wire into the collecting system was performed under fluoroscopy. A small incision was made with an 11 blade scalpel, and the needle was removed from the wire. An Accustick system was then advanced over the wire into the collecting system under fluoroscopy. The metal stiffener and inner dilator were removed, and then a sample of fluid was aspirated through the 4 French outer sheath. Bentson wire was passed into the collecting system and the sheath removed.  Ten French dilation of the soft tissues was performed. Using modified Seldinger technique, a 10 French pigtail catheter drain was placed over the Bentson wire. Wire and inner stiffener removed, and the pigtail was formed in the collecting system. Small amount of contrast confirmed position of the catheter. Patient tolerated the procedure well and remained hemodynamically stable throughout. No complications were encountered and no significant blood loss encountered IMPRESSION: Status post bilateral percutaneous nephrostomy. Signed, Dulcy Fanny. Earleen Newport, DO [facility] Radiology Electronically Signed   By: Corrie Mckusick D.O.   On: 09/02/2017 17:32        Scheduled Meds: . anastrozole  1 mg Oral Daily  . cholecalciferol  10,000 Units Oral Weekly  . enoxaparin (LOVENOX) injection  30 mg Subcutaneous Q24H  . famotidine  20 mg Oral Daily  . insulin aspart  0-20 Units Subcutaneous TID WC  . insulin aspart  0-5 Units Subcutaneous QHS  . insulin glargine  20 Units Subcutaneous BID  . levothyroxine  150 mcg Oral QAC breakfast  . lip balm  1 application Topical BID  . metoCLOPramide  5 mg Oral TID AC  . polyvinyl alcohol  1 drop Both Eyes QHS  . prenatal multivitamin  1 tablet Oral Daily  . protein supplement shake  11 oz Oral BID BM  . psyllium  1 packet Oral Daily  . rosuvastatin  10 mg Oral QPM  .  Tbo-Filgrastim  480 mcg Subcutaneous Weekly   Continuous Infusions: . sodium chloride    . ciprofloxacin Stopped (09/04/17 1035)     LOS: 6 days    Time spent: 80 minutes    GARBA,LAWAL, MD [facility] Pager [phone] If 7PM-7AM, please contact night-coverage [email or web address] Password [email or web address] 09/04/2017, 11:31 AM

## 2017-09-04 NOTE — Progress Notes (Signed)
[facility] Rounding Note  Subjective/Interval History:  Bilateral perc tubes 2/1 So far creatinine unchanged but UOP excellent (2450 L nephrostomy, 1100 L R, 625 foley)   Leaking ostomy issues all night, skin very inflamed  SO depressed, tearful, now acknowledges that she needs some help with this Still mild dyspnea  Objective Vital signs in last 24 hours: Vitals:   09/03/17 0555 09/03/17 2204 09/04/17 0500 09/04/17 0532  BP: 134/62 (!) 117/54  (!) 124/50  Pulse: 83 (!) 116  91  Resp: 16 16  16   Temp: 98.7 F (37.1 C) 99.7 F (37.6 C)  98.9 F (37.2 C)  TempSrc: Oral Oral  Oral  SpO2: 100% 100%  100%  Weight: 94 kg (207 lb 3.7 oz)  94.1 kg (207 lb 7.3 oz)   Height:       Weight change: 0.1 kg (3.5 oz)  Intake/Output Summary (Last 24 hours) at 09/04/2017 0706 Last data filed at 09/04/2017 0600 Gross per 24 hour  Intake 1180 ml  Output 5200 ml  Net -4020 ml   Physical examination BP (!) 124/50 (BP Location: Right Leg)   Pulse 91   Temp 98.9 F (37.2 C) (Oral)   Resp 16   Ht 5\' 3"  (1.6 m)   Wt 94.1 kg (207 lb 7.3 oz)   SpO2 100%   BMI 36.75 kg/m   Pale WF BP stable Sats 100% Extremely pale Wearing O2 Normal heart sounds  S1S2 No S3 No murmur Some basliar crackles and decr R base breath sounds Abd with ileostomy bag that has leaked overnight Remains mildly tender in RLQ + BS Now only trace LE edema PICC R arm  Recent Labs  Lab 08/29/17 1550 08/30/17 0623 08/31/17 0451 09/01/17 0406 09/02/17 0334 09/03/17 0455 09/04/17 0416  NA 129* 130* 128* 126* 125* 132* 132*  K 3.2* 3.0* 3.6 3.0* 3.3* 3.6 3.7  CL 106 107 105 98* 94* 99* 101  CO2 11* 10* 15* 19* 22 23 23   GLUCOSE 180* 182* 221* 306* 357* 187* 209*  BUN 79* 81* 69* 61* 59* 56* 55*  CREATININE 4.28* 4.24* 3.60* 3.46* 3.61* 3.59* 3.61*  CALCIUM 9.0 8.8* 8.1* 7.6* 7.6* 8.5* 8.4*  PHOS 4.6  --  2.0* 2.5 2.5 3.8 3.8   Recent Labs  Lab 08/29/17 1550  09/02/17 0334 09/03/17 0455 09/04/17 0416  AST 16   --   --   --   --   ALT 24  --   --   --   --   ALKPHOS 173*  --   --   --   --   BILITOT 0.5  --   --   --   --   PROT 6.8  --   --   --   --   ALBUMIN 2.1*   < > 1.4* 1.9* 1.8*   < > = values in this interval not displayed.    Recent Labs  Lab 08/30/17 0623  09/01/17 0406 09/02/17 0334 09/03/17 0455 09/04/17 0416  WBC 2.8*   < > 4.3 5.7 4.4 13.6*  NEUTROABS 1.8  --  3.1  --  3.3 11.8*  HGB 9.4*   < > 8.4* 8.1* 8.2* 8.6*  HCT 26.8*   < > 24.5* 23.3* 24.9* 26.7*  MCV 84.3   < > 85.4 86.0 88.9 90.5  PLT 222   < > 200 184 221 268   < > = values in this interval not displayed.    Recent Labs  Lab 09/02/17 2207 09/03/17 0803 09/03/17 1216 09/03/17 1651 09/03/17 2147  GLUCAP 230* 142* 304* 162* 215*   09/03/17 Mg 1.9 09/04/17 Mg 1.3  Studies/Results: Ir Nephrostogram Left Initial Placement  Result Date: 09/02/2017 INDICATION: 62 year old female with a history bilateral ureteral obstruction. EXAM: IR NEPHROSTOGRAM INI PLACEMENT RIGHT; IR NEPHROSTOGRAM INI PLACEMENT LEFT COMPARISON:  CT 08/31/2017 MEDICATIONS: Ciprofloxacin; The antibiotic was administered in an appropriate time frame prior to skin puncture. ANESTHESIA/SEDATION: Fentanyl 100 mcg IV; Versed 1.0 mg IV Moderate Sedation Time:  27 minutes The patient was continuously monitored during the procedure by the interventional radiology nurse under my direct supervision. CONTRAST:  20 cc-administered into the collecting system(s) FLUOROSCOPY TIME:  Fluoroscopy Time: 1 minutes 30 seconds (32 mGy). COMPLICATIONS: None PROCEDURE: Informed written consent was obtained from the patient after a thorough discussion of the procedural risks, benefits and alternatives. All questions were addressed. Maximal Sterile Barrier Technique was utilized including caps, mask, sterile gowns, sterile gloves, sterile drape, hand hygiene and skin antiseptic. A timeout was performed prior to the initiation of the procedure. Patient positioned prone position  on the fluoroscopy table. The patient was then prepped and draped in the usual sterile fashion. 1% lidocaine was used to anesthetize the skin and subcutaneous tissues for local anesthesia. Left: Ultrasound survey of the left flank was performed with images stored and sent to PACs. A Chiba needle was then used to access a posterior inferior calyx with ultrasound guidance. With spontaneous urine returned through the needle, passage of an 018 micro wire into the collecting system was performed under fluoroscopy. A small incision was made with an 11 blade scalpel, and the needle was removed from the wire. An Accustick system was then advanced over the wire into the collecting system under fluoroscopy. The metal stiffener and inner dilator were removed, and then a sample of fluid was aspirated through the 4 French outer sheath. Bentson wire was passed into the collecting system and the sheath removed. Ten French dilation of the soft tissues was performed. Using modified Seldinger technique, a 10 French pigtail catheter drain was placed over the Bentson wire. Wire and inner stiffener removed, and the pigtail was formed in the collecting system. Small amount of contrast confirmed position of the catheter. Right: Ultrasound survey of the right flank was performed with images stored and sent to PACs. A Chiba needle was then used to access a posterior inferior calyx with ultrasound guidance. With spontaneous urine returned through the needle, passage of an 018 micro wire into the collecting system was performed under fluoroscopy. A small incision was made with an 11 blade scalpel, and the needle was removed from the wire. An Accustick system was then advanced over the wire into the collecting system under fluoroscopy. The metal stiffener and inner dilator were removed, and then a sample of fluid was aspirated through the 4 French outer sheath. Bentson wire was passed into the collecting system and the sheath removed. Ten  French dilation of the soft tissues was performed. Using modified Seldinger technique, a 10 French pigtail catheter drain was placed over the Bentson wire. Wire and inner stiffener removed, and the pigtail was formed in the collecting system. Small amount of contrast confirmed position of the catheter. Patient tolerated the procedure well and remained hemodynamically stable throughout. No complications were encountered and no significant blood loss encountered IMPRESSION: Status post bilateral percutaneous nephrostomy. Signed, Dulcy Fanny. Earleen Newport, DO [facility] Radiology Electronically Signed   By: Corrie Mckusick  D.O.   On: 09/02/2017 17:32   Ir Nephrostogram Right Initial Placement  Result Date: 09/02/2017 INDICATION: 62 year old female with a history bilateral ureteral obstruction. EXAM: IR NEPHROSTOGRAM INI PLACEMENT RIGHT; IR NEPHROSTOGRAM INI PLACEMENT LEFT COMPARISON:  CT 08/31/2017 MEDICATIONS: Ciprofloxacin; The antibiotic was administered in an appropriate time frame prior to skin puncture. ANESTHESIA/SEDATION: Fentanyl 100 mcg IV; Versed 1.0 mg IV Moderate Sedation Time:  27 minutes The patient was continuously monitored during the procedure by the interventional radiology nurse under my direct supervision. CONTRAST:  20 cc-administered into the collecting system(s) FLUOROSCOPY TIME:  Fluoroscopy Time: 1 minutes 30 seconds (32 mGy). COMPLICATIONS: None PROCEDURE: Informed written consent was obtained from the patient after a thorough discussion of the procedural risks, benefits and alternatives. All questions were addressed. Maximal Sterile Barrier Technique was utilized including caps, mask, sterile gowns, sterile gloves, sterile drape, hand hygiene and skin antiseptic. A timeout was performed prior to the initiation of the procedure. Patient positioned prone position on the fluoroscopy table. The patient was then prepped and draped in the usual sterile  fashion. 1% lidocaine was used to anesthetize the skin and subcutaneous tissues for local anesthesia. Left: Ultrasound survey of the left flank was performed with images stored and sent to PACs. A Chiba needle was then used to access a posterior inferior calyx with ultrasound guidance. With spontaneous urine returned through the needle, passage of an 018 micro wire into the collecting system was performed under fluoroscopy. A small incision was made with an 11 blade scalpel, and the needle was removed from the wire. An Accustick system was then advanced over the wire into the collecting system under fluoroscopy. The metal stiffener and inner dilator were removed, and then a sample of fluid was aspirated through the 4 French outer sheath. Bentson wire was passed into the collecting system and the sheath removed. Ten French dilation of the soft tissues was performed. Using modified Seldinger technique, a 10 French pigtail catheter drain was placed over the Bentson wire. Wire and inner stiffener removed, and the pigtail was formed in the collecting system. Small amount of contrast confirmed position of the catheter. Right: Ultrasound survey of the right flank was performed with images stored and sent to PACs. A Chiba needle was then used to access a posterior inferior calyx with ultrasound guidance. With spontaneous urine returned through the needle, passage of an 018 micro wire into the collecting system was performed under fluoroscopy. A small incision was made with an 11 blade scalpel, and the needle was removed from the wire. An Accustick system was then advanced over the wire into the collecting system under fluoroscopy. The metal stiffener and inner dilator were removed, and then a sample of fluid was aspirated through the 4 French outer sheath. Bentson wire was passed into the collecting system and the sheath removed. Ten French dilation of the soft tissues was performed. Using modified Seldinger technique, a 10  French pigtail catheter drain was placed over the Bentson wire. Wire and inner stiffener removed, and the pigtail was formed in the collecting system. Small amount of contrast confirmed position of the catheter. Patient tolerated the procedure well and remained hemodynamically stable throughout. No complications were encountered and no significant blood loss encountered IMPRESSION: Status post bilateral percutaneous nephrostomy. Signed, Dulcy Fanny. Earleen Newport, DO [facility] Radiology Electronically Signed   By: Corrie Mckusick D.O.   On: 09/02/2017 17:32   Medications: . sodium chloride    . ciprofloxacin Stopped (09/03/17 1112)  .  magnesium sulfate 1 - 4 g bolus IVPB Stopped (09/02/17 1945)   . anastrozole  1 mg Oral Daily  . cholecalciferol  10,000 Units Oral Weekly  . enoxaparin (LOVENOX) injection  30 mg Subcutaneous Q24H  . famotidine  20 mg Oral Daily  . insulin aspart  0-20 Units Subcutaneous TID WC  . insulin aspart  0-5 Units Subcutaneous QHS  . insulin glargine  20 Units Subcutaneous BID  . levothyroxine  150 mcg Oral QAC breakfast  . lip balm  1 application Topical BID  . metoCLOPramide  5 mg Oral TID AC  . polyvinyl alcohol  1 drop Both Eyes QHS  . prenatal multivitamin  1 tablet Oral Daily  . protein supplement shake  11 oz Oral BID BM  . psyllium  1 packet Oral Daily  . rosuvastatin  10 mg Oral QPM  . Tbo-Filgrastim  480 mcg Subcutaneous Weekly   Assessment/Recommendations  1. Acute kidney injury - On admission 2/2 inadequate intake in face of high output ileostomy. Some degree of hydro chronically. New urine leak identified with rectovesical fistula. Renal failure present since at least 08/17/17 with SCr 3 when ureteral stent removed (baseline 1.0 in November). Possible  ATN from prolonged volume depletion; can't exclude effects of mechanical obstruction long term .   1. Bilateral perc tubes 2/1,excellent UOP, little change yet in  creatinine 2. Volume has swung from hypovolemia to hypervolemia w/fluids - will now have to watch again for volume depletion given large diuresis post perc (and poor po intake 2/2 fear of laking ostomy) - still has some SOB and some base crackles, mild pretib edema so would not resume IVF just yet 3. Still hoping for some improvement renal fx   2. Metabolic acidosis  - on metformin with AKI.  Resolved post isotonic bicarb infusion. 3. Hypokalemia -Stable post repletion 4. Hypomagnesemia - Repleted 2/1, needs more today. Ordered. 5. Anemia - Transfused 1 U 1/31. Fe stores VERY low but no IV Fe while on IV ATB's 6. Polymicrobial UTI - serratia and Group B strep. Cipro. BC's neg. 7. Chronic neutropenia - last dosed herself with filagrastin on PM prior to admission (so 1/27).  Resumed yesterday to get her back on schedule 8. Depression/anxiety - says "reaching her breaking point and needs something for depression - prefer that Page to address this  Dr. Jonnie Finner will follow for nephrology starting tomorrow  [name], MD [place] [phone] pager 09/04/2017, 7:06 AM

## 2017-09-05 LAB — URINE CULTURE
Culture: 80000 — AB
Culture: 40000 — AB

## 2017-09-05 LAB — CBC WITH DIFFERENTIAL/PLATELET
BASOS PCT: 0 %
Basophils Absolute: 0 10*3/uL (ref 0.0–0.1)
EOS ABS: 0.1 10*3/uL (ref 0.0–0.7)
Eosinophils Relative: 2 %
HEMATOCRIT: 25 % — AB (ref 36.0–46.0)
Hemoglobin: 8.2 g/dL — ABNORMAL LOW (ref 12.0–15.0)
LYMPHS PCT: 10 %
Lymphs Abs: 0.6 10*3/uL — ABNORMAL LOW (ref 0.7–4.0)
MCH: 29.3 pg (ref 26.0–34.0)
MCHC: 32.8 g/dL (ref 30.0–36.0)
MCV: 89.3 fL (ref 78.0–100.0)
Monocytes Absolute: 0.7 10*3/uL (ref 0.1–1.0)
Monocytes Relative: 12 %
NEUTROS ABS: 4.2 10*3/uL (ref 1.7–7.7)
Neutrophils Relative %: 76 %
PLATELETS: 272 10*3/uL (ref 150–400)
RBC: 2.8 MIL/uL — ABNORMAL LOW (ref 3.87–5.11)
RDW: 15.2 % (ref 11.5–15.5)
WBC: 5.6 10*3/uL (ref 4.0–10.5)

## 2017-09-05 LAB — MAGNESIUM: Magnesium: 2 mg/dL (ref 1.7–2.4)

## 2017-09-05 LAB — GLUCOSE, CAPILLARY
GLUCOSE-CAPILLARY: 189 mg/dL — AB (ref 65–99)
GLUCOSE-CAPILLARY: 410 mg/dL — AB (ref 65–99)
GLUCOSE-CAPILLARY: 248 mg/dL — AB (ref 65–99)
Glucose-Capillary: 295 mg/dL — ABNORMAL HIGH (ref 65–99)

## 2017-09-05 LAB — RENAL FUNCTION PANEL
ANION GAP: 9 (ref 5–15)
Albumin: 1.8 g/dL — ABNORMAL LOW (ref 3.5–5.0)
BUN: 52 mg/dL — ABNORMAL HIGH (ref 6–20)
CHLORIDE: 101 mmol/L (ref 101–111)
CO2: 22 mmol/L (ref 22–32)
Calcium: 8.5 mg/dL — ABNORMAL LOW (ref 8.9–10.3)
Creatinine, Ser: 3 mg/dL — ABNORMAL HIGH (ref 0.44–1.00)
GFR calc Af Amer: 18 mL/min — ABNORMAL LOW (ref >60)
GFR, EST NON AFRICAN AMERICAN: 16 mL/min — AB (ref >60)
Glucose, Bld: 239 mg/dL — ABNORMAL HIGH (ref 65–99)
POTASSIUM: 3.4 mmol/L — AB (ref 3.5–5.1)
Phosphorus: 3.4 mg/dL (ref 2.5–4.6)
Sodium: 132 mmol/L — ABNORMAL LOW (ref 135–145)

## 2017-09-05 MED ORDER — INSULIN GLARGINE 100 UNIT/ML ~~LOC~~ SOLN
25.0000 [IU] | Freq: Two times a day (BID) | SUBCUTANEOUS | Status: DC
  Administered 2017-09-05 – 2017-09-25 (×40): 25 [IU] via SUBCUTANEOUS
  Filled 2017-09-05 (×44): qty 0.25

## 2017-09-05 MED ORDER — PREGABALIN 75 MG PO CAPS
75.0000 mg | ORAL_CAPSULE | Freq: Every day | ORAL | Status: DC
  Administered 2017-09-05 – 2017-09-11 (×7): 75 mg via ORAL
  Filled 2017-09-05 (×7): qty 1

## 2017-09-05 MED ORDER — GABAPENTIN 300 MG PO CAPS
300.0000 mg | ORAL_CAPSULE | Freq: Two times a day (BID) | ORAL | Status: DC
  Administered 2017-09-05: 300 mg via ORAL
  Filled 2017-09-05: qty 1

## 2017-09-05 MED ORDER — METHOCARBAMOL 500 MG PO TABS: 1000.0000 mg | ORAL_TABLET | Freq: Four times a day (QID) | ORAL | Status: DC | PRN

## 2017-09-05 MED ORDER — ACETAMINOPHEN 500 MG PO TABS
1000.0000 mg | ORAL_TABLET | Freq: Three times a day (TID) | ORAL | Status: DC
Start: 2017-09-05 — End: 2017-09-13
  Administered 2017-09-05 – 2017-09-12 (×22): 1000 mg via ORAL
  Filled 2017-09-05 (×22): qty 2

## 2017-09-05 MED ORDER — METHOCARBAMOL 1000 MG/10ML IJ SOLN
1000.0000 mg | Freq: Four times a day (QID) | INTRAMUSCULAR | Status: DC | PRN
  Filled 2017-09-05: qty 10

## 2017-09-05 MED ORDER — METHOCARBAMOL 500 MG PO TABS
1000.0000 mg | ORAL_TABLET | Freq: Four times a day (QID) | ORAL | Status: DC | PRN
  Administered 2017-09-07: 1000 mg via ORAL
  Filled 2017-09-05: qty 2

## 2017-09-05 MED ORDER — PREMIER PROTEIN SHAKE
11.0000 [oz_av] | Freq: Two times a day (BID) | ORAL | Status: DC
  Administered 2017-09-05 – 2017-09-19 (×16): 11 [oz_av] via ORAL

## 2017-09-05 MED ORDER — INSULIN ASPART 100 UNIT/ML ~~LOC~~ SOLN
5.0000 [IU] | Freq: Three times a day (TID) | SUBCUTANEOUS | Status: DC
Start: 2017-09-05 — End: 2017-09-06
  Administered 2017-09-05 – 2017-09-06 (×3): 5 [IU] via SUBCUTANEOUS

## 2017-09-05 NOTE — Progress Notes (Addendum)
Inpatient Diabetes Program Recommendations  AACE/ADA: New Consensus Statement on Inpatient Glycemic Control (2015)  Target Ranges:  Prepandial:   less than 140 mg/dL      Peak postprandial:   less than 180 mg/dL (1-2 hours)      Critically ill patients:  140 - 180 mg/dL   Lab Results  Component Value Date   GLUCAP 410 (H) 09/05/2017   HGBA1C 7.9 (H) 09/01/2017    Review of Glycemic Control Results for MATTI, KILLINGSWORTH (MRN [id number]) as of 09/05/2017 14:07  Ref. Range 09/04/2017 21:38 09/05/2017 07:34 09/05/2017 11:49  Glucose-Capillary Latest Ref Range: 65 - 99 mg/dL 307 (H) 189 (H) 410 (H)   Diabetes history: Type 2 DM Outpatient Diabetes medications: Januvia 100 mg in AM Current orders for Inpatient glycemic control: Lantus 25 Units BID, Novolog 0-20 Units TIDAC, Novolog 0-5 Units QHS  Inpatient Diabetes Program Recommendations:    Spoke with patient's RN to verify oral intake. RN confirmed patient eating well, with CHO exceeding 50gms. Discussed blood sugar of 410mg /dL. RN having difficulty obtaining orders per medical service. Will reach out to hospitalist to get POC and orders.  Spoke with Dr Bonner Puna, who is covering this patient. Discussed A1C of 7.9%, oral take and home medication regimen. After discussing current orders and recent changes to Lantus, orders will placed for Novolog 5 units TIDAC and Novolog 20 Units of correction now (then to resume Novolog 0-20 Units TID correction).  Will plan to touch base with patient.  Thanks, Bronson Curb, MSN, RNC-OB Diabetes Coordinator [phone] (8a-5p)  Addendum: Spoke with patient regarding home medication regimen, verified patient taking Januvia 100mg  in AM and recently Metformin had been discontinued. Recently, patient explains she has been under a lot of stress with her health. Provided education and comfort for increased A1C, and current blood sugars. Explained the insulin demands of her body during this time of healing and that  stress can cause increased blood sugars. Reviewed patient's current A1c of 7.9%. Explained what a A1c is and what it measures. Also reviewed goal A1c with patient, importance of good glucose control @ home, and blood sugar goals. Patient comfortable with counting carbs, has a meter and test strips at home. Was surprised by increased A1C. We discussed the updated recommendations and the need for additional insulin at this time. Patient had no further questions.

## 2017-09-05 NOTE — Evaluation (Signed)
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 Pt had her stent removed 08/17/17 and admitted 08/29/17 for acute renal failure.   Clinical Impression   Pt admitted for abdominal surgery. Pt currently with functional limitations due to the deficits listed below (see OT Problem List).  Pt will benefit from skilled OT to increase their safety and independence with ADL and functional mobility for ADL to facilitate discharge to venue listed below.      Follow Up Recommendations  SNF - pts husband works and pt is not able to take care of self/drains/ and needs to get stronger   Equipment Recommendations  Other (comment)       Precautions / Restrictions Precautions Precautions: Fall Precaution Comments: Multiple lines (catheter, bil PCN tubes, O2?) Restrictions Weight Bearing Restrictions: No      Mobility Bed Mobility               General bed mobility comments: oob in recliner  Transfers Overall transfer level: Needs assistance Equipment used: Rolling walker (2 wheeled) Transfers: Sit to/from Stand Sit to Stand: Min guard              Balance Overall balance assessment: Needs assistance         Standing balance support: Bilateral upper extremity supported Standing balance-Leahy Scale: Poor Standing balance comment: requring RW currently                           ADL either performed or assessed with clinical judgement   ADL Overall ADL's : Needs assistance/impaired Eating/Feeding: Set up;Sitting   Grooming: Set up;Sitting   Upper Body Bathing: Sitting;Minimal  assistance   Lower Body Bathing: Moderate assistance;Sit to/from stand   Upper Body Dressing : Sitting;Minimal assistance   Lower Body Dressing: Moderate assistance;Sit to/from stand;Cueing for sequencing;Cueing for safety                 General ADL Comments: Theraband issued and BUE HEP initiated     Vision Patient Visual Report: No change from baseline              Pertinent Vitals/Pain Pain Score: 3  Pain Location: abdomen Pain Descriptors / Indicators: Sore Pain Intervention(s): Limited activity within patient's tolerance;Repositioned     Hand Dominance     Extremity/Trunk Assessment Upper Extremity Assessment Upper Extremity Assessment: Generalized weakness           Communication Communication Communication: No difficulties   Cognition Arousal/Alertness: Awake/alert Behavior During Therapy: WFL for tasks assessed/performed Overall Cognitive Status: Within Functional Limits for tasks assessed                                                Home Living Family/patient expects to be discharged to:: Private residence Living Arrangements: Spouse/significant other Available Help at Discharge: Family Type of Home: House Home Access: Stairs to enter Technical brewer of Steps: 3   Home Layout: Able to live on main level with bedroom/bathroom  Home Equipment: [place] - 4 wheels          Prior Functioning/Environment Level of Independence: Independent with assistive device(s)                 OT Problem List: Pain;Decreased safety awareness;Decreased activity tolerance;Decreased strength;Decreased knowledge of use of DME or AE;Cardiopulmonary status limiting activity      OT Treatment/Interventions: Self-care/ADL training;Patient/family education;DME and/or AE instruction;Energy conservation    OT Goals(Current goals can be found in the care plan section) Acute Rehab OT Goals Patient Stated Goal: get  well OT Goal Formulation: With patient Time For Goal Achievement: 09/19/17 Potential to Achieve Goals: Good  OT Frequency: Min 2X/week   Barriers to D/C:            Co-evaluation              AM-PAC PT "6 Clicks" Daily Activity     Outcome Measure Help from another person eating meals?: None Help from another person taking care of personal grooming?: None Help from another person toileting, which includes using toliet, bedpan, or urinal?: A Lot Help from another person bathing (including washing, rinsing, drying)?: A Lot Help from another person to put on and taking off regular upper body clothing?: A Little Help from another person to put on and taking off regular lower body clothing?: A Lot 6 Click Score: 17   End of Session Equipment Utilized During Treatment: Rolling walker Nurse Communication: Mobility status  Activity Tolerance: Patient tolerated treatment well Patient left: in chair  OT Visit Diagnosis: Unsteadiness on feet (R26.81);Muscle weakness (generalized) (M62.81)                Time: 4034-7425 OT Time Calculation (min): 30 min Charges:  OT General Charges $OT Visit: 1 Visit OT Evaluation $OT Eval Moderate Complexity: 1 Mod OT Treatments $Self Care/Home Management : 8-22 mins G-Codes:     [name], [place]  [name] 09/05/2017, 4:21 PM

## 2017-09-05 NOTE — Consult Note (Signed)
[place] Nurse ostomy follow up Stoma type/location: RLQ ileostomy Stomal assessment/size: 1 and 1/8 inch see note from Thursday,  Peristomal assessment: not seen today Treatment options for stomal/peristomal skin: Complex pouching system Output: brown stool  Ostomy pouching: 1pc.convex pouching system  Education provided: None.  Support provided. Patient relayed conversation with Dr. Johney Maine this morning. Patient described leakage problem over weekend and that she had to call her husband away from church to assist her.  Reiterated that supplies were in room and that instructions were at bedside. I know that she feels comfortable with her husband and me (her ostomy nurse) assisting , but the staff is able to assist her with instructions and supplies. Patient is more optimistic today, believe that addition of neurontin and tylenol to pain med is working. Enrolled patient in [place] Start Discharge program: Yes, previously  [place] nursing team will follow, and will remain available to this patient, the nursing, surgical and medical teams.   Thanks, Maudie Flakes, MSN, RN, [place], Arther Abbott  Pager# [phone]

## 2017-09-05 NOTE — Progress Notes (Signed)
Calorie Count Note  48 hour calorie count ordered. Day 2 results below.  Diet: Heart Healthy  Supplements: Premier Protein BID, each provides 160 kcal and 30g protein  2/3: Breakfast: 406 kcal, 23g protein Lunch: 277.5 kcal, 11.25g protein Dinner: 867 kcal, 30g protein Supplements: 320 kcal, 60g protein  Estimated Nutritional Needs: [id number]:[id number] Protein:65-75g  Total intake: 1870.5 kcal (113% of minimum estimated needs)   124.25g protein (191% of minimum estimated needs)  Nutrition Dx:  Increased nutrient needsrelated to post-op healingas evidenced by estimated needs. Meeting at this time.  Goal: Pt to meet >/= 90% of their estimated nutrition needs   Intervention:  -D/C Calorie Count -Continue Premier Protein BID, each supplement provides 160 kcal and 30 grams of protein.   Yomar Mejorado, MS, Dietetic Intern Pager # [phone]

## 2017-09-05 NOTE — Progress Notes (Signed)
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 She underwent a prolonged procedure 04/01/2017 with colostomy takedown, ileocecal resection with coloanal anastomosis and diverting loop ileostomy, 5 hours of LOA, and right ureteral stent exchange with psoas bladder hitch. Since that time she reported significant ileostomy output and was felt not to be keeping up with per oral intake. Had ureteral stent removed Jan 2019, creatinine noted to be up to 3 from baseline of 1. Had been given IVF's as outpatient, but labs showed acute renal failure with creatinine 4.28, so patient admitted by surgery on 1/28. Nephrology, urology, and hospitalists consulted. CT showed new colovesical fistula.   Assessment & Plan: Principal Problem:   Colovesical fistula - delayed Active Problems:   Primary hypothyroidism   DM type 2 (diabetes mellitus, type 2) ([place])   History of ovarian & endometrial cancer   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 s/p resection/partial vaginectomy/ LAR/colostomy 11/19/2014   Morbid obesity (HCC)   Ureteral stricture, right, 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) 04/01/2017   Pelvic cancer s/p colostomy takedown/loop ileostomy diversion 04/01/2017   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   Protein-calorie malnutrition, moderate (HCC)   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HCC)   Chronic kidney disease (CKD), stage III (moderate) (HCC)   Dehydration   ARF (acute renal failure) (HCC)   Adjustment disorder with mixed anxiety and depressed mood   Poorly controlled diabetes mellitus ([place])   Pressure injury of skin   History of external beam radiation therapy to  pelvis  T2DM: HbA1c 5.3% in Aug 2018, now up to 7.9%. During hospitalization, CBGs have been substantially elevated. Despite 80 units of insulin in this patient not previously on insulin, CBG at lunch is 410mg /dl.  - Will add 25 units total daily insulin: 5u TID WC in addition to resistant SSI and increase lantus by 5u BID to 25u BID. Continue HS correction. - Diabetes coordinator to follow up.  - Holding oral agents including metformin of course  Acute renal failure on stage II CKD: Suspected prerenal etiology (both decreased po intake and high output ileostomy) without imaging evidence of urinary obstruction. As Dr. Tresa Moore points out, BUN has been elevated since ileostomy.  - Nephrology consulted - Trend BMP, UOP.   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 s/p bilateral nephrostomy tubes 2/1 and foley placed for diversion.  - Optimize nutritional status/glycemic control  - Per surgery and urology.  Recurrent endometrial CA s/p resection, adjuvant radiation, partial vaginectomy, LAR/colostomy April 2016. No chemotherapy due to idiopathic neutropenia. History of breast CA s/p bilateral mastectomies: Stage IIA, ER/PR positive. Followed by Dr. Denman [name].  - Continue arimidex   Chronic idiopathic neutropenia: Improved. Followed by Dr. Alvy Bimler - Monitor CBC, repeat GCSF prn.  Iron deficiency anemia: - Hgb stable since 1u PRBCs 1/31. Continue monitoring.  High output ileostomy:  - WOC consulted  Polymicrobial UTI: >100k Serratia marcescens, >80k GBS on UCx, - Continue ciprofloxacin (PCN allergy)   Labile affect, adjustment disorder, possible depression: No current SI/HI.  - Zoloft started at admission.  - Would benefit from counseling as outpatient. Reports having significant social supports in place.  Moderate protein-calorie malnutrition: Suspect adding protein would benefit patient even with renal failure,  but defer to nephrology if protein should be restricted.  - RD consulted, supplement  as pt can tolerate.   Hypokalemia, hypomagnesemia:  - Replacement per nephrology.  Hypothyroidism: TSH 2.408 - Continue stable dose synthroid.   Hyperlipidemia:  - Continue statin   DVT prophylaxis: Lovenox 30mg  Code Status: Full Family Communication: None at bedside Disposition Plan: Uncertain, suspect SNF will be needed  Consultants:   Hospitalists  Nephrology  Urology  Procedures:   None  Antimicrobials:  Ciprofloxacin 1/29 >>   Subjective: Pain not controlled in bilateral nephrostomy sites. No fever. Urine leaking from rectum during ambulation.   Objective: Vitals:   09/04/17 0532 09/04/17 1400 09/04/17 2051 09/05/17 0500  BP: (!) 124/50 (!) 124/54 (!) 129/52 (!) 121/55  Pulse: 91 92 94 91  Resp: 16 18 18 18   Temp: 98.9 F (37.2 C) 98.3 F (36.8 C) 98.5 F (36.9 C) 98.9 F (37.2 C)  TempSrc: Oral Oral Oral Oral  SpO2: 100% 100% 100% 99%  Weight:    94.1 kg (207 lb 7.3 oz)  Height:    5\' 2"  (1.575 m)    Intake/Output Summary (Last 24 hours) at 09/05/2017 1408 Last data filed at 09/05/2017 0957 Gross per 24 hour  Intake 0 ml  Output 2475 ml  Net -2475 ml   Filed Weights   09/03/17 0555 09/04/17 0500 09/05/17 0500  Weight: 94 kg (207 lb 3.7 oz) 94.1 kg (207 lb 7.3 oz) 94.1 kg (207 lb 7.3 oz)   Gen: Chronically ill-appearing 62 y.o. female in evident pain tearful in bedside chair. Pulm: Non-labored, clear to auscultation bilaterally.  CV: Regular borderline tachycardia. No murmur, rub, or gallop. No JVD, no pedal edema. GI: Abdomen soft, mild tenderness diffusely without rebound or guarding, non-distended, with normoactive bowel sounds. No organomegaly or masses felt. Ext: Warm, no deformities Skin: Ileostomy site without tenderness, erythema, discharge; DL PICC right brachial c/d/i. Alopecia noted. Neuro: Alert and oriented. No focal neurological deficits. Psych: Judgement and insight appear normal. Mood labile with congruent, broad affect.    CBC: Recent Labs  Lab 08/30/17 0623  09/01/17 0406 09/02/17 0334 09/03/17 0455 09/04/17 0416 09/05/17 0433  WBC 2.8*   < > 4.3 5.7 4.4 13.6* 5.6  NEUTROABS 1.8  --  3.1  --  3.3 11.8* 4.2  HGB 9.4*   < > 8.4* 8.1* 8.2* 8.6* 8.2*  HCT 26.8*   < > 24.5* 23.3* 24.9* 26.7* 25.0*  MCV 84.3   < > 85.4 86.0 88.9 90.5 89.3  PLT 222   < > 200 184 221 268 272   < > = values in this interval not displayed.   Basic Metabolic Panel: Recent Labs  Lab 09/01/17 0406 09/02/17 0334 09/03/17 0455 09/04/17 0416 09/05/17 0433  NA 126* 125* 132* 132* 132*  K 3.0* 3.3* 3.6 3.7 3.4*  CL 98* 94* 99* 101 101  CO2 19* 22 23 23 22   GLUCOSE 306* 357* 187* 209* 239*  BUN 61* 59* 56* 55* 52*  CREATININE 3.46* 3.61* 3.59* 3.61* 3.00*  CALCIUM 7.6* 7.6* 8.5* 8.4* 8.5*  MG 2.0 1.5* 1.9 1.3* 2.0  PHOS 2.5 2.5 3.8 3.8 3.4   Liver Function Tests: Recent Labs  Lab 08/29/17 1550  09/01/17 0406 09/02/17 0334 09/03/17 0455 09/04/17 0416 09/05/17 0433  AST 16  --   --   --   --   --   --   ALT 24  --   --   --   --   --   --  ALKPHOS 173*  --   --   --   --   --   --   BILITOT 0.5  --   --   --   --   --   --   PROT 6.8  --   --   --   --   --   --   ALBUMIN 2.1*   < > 1.6* 1.4* 1.9* 1.8* 1.8*   < > = values in this interval not displayed.   HbA1C: No results for input(s): HGBA1C in the last 72 hours. CBG: Recent Labs  Lab 09/04/17 1140 09/04/17 1631 09/04/17 2138 09/05/17 0734 09/05/17 1149  GLUCAP 369* 227* 307* 189* 410*   Thyroid Function Tests: No results for input(s): TSH, T4TOTAL, FREET4, T3FREE, THYROIDAB in the last 72 hours. Anemia Panel: No results for input(s): VITAMINB12, FOLATE, FERRITIN, TIBC, IRON, RETICCTPCT in the last 72 hours. Urine analysis:    Component Value Date/Time   COLORURINE YELLOW 08/30/2017 1240   APPEARANCEUR CLOUDY (A) 08/30/2017 1240   LABSPEC 1.006 08/30/2017 1240   LABSPEC 1.015 11/17/2015 1436   PHURINE 5.0 08/30/2017 1240   GLUCOSEU  NEGATIVE 08/30/2017 1240   GLUCOSEU 2,000 11/17/2015 1436   HGBUR MODERATE (A) 08/30/2017 1240   BILIRUBINUR NEGATIVE 08/30/2017 1240   BILIRUBINUR neg 08/30/2016 1025   BILIRUBINUR Negative 11/17/2015 1436   KETONESUR NEGATIVE 08/30/2017 1240   PROTEINUR 100 (A) 08/30/2017 1240   UROBILINOGEN negative 08/30/2016 1025   UROBILINOGEN 0.2 11/17/2015 1436   NITRITE NEGATIVE 08/30/2017 1240   LEUKOCYTESUR LARGE (A) 08/30/2017 1240   LEUKOCYTESUR Moderate 11/17/2015 1436   Recent Results (from the past 240 hour(s))  Culture, Urine     Status: Abnormal   Collection Time: 08/30/17 10:10 AM  Result Value Ref Range Status   Specimen Description URINE, RANDOM  Final   Special Requests NONE  Final   Culture (A)  Final    >=100,000 COLONIES/mL SERRATIA MARCESCENS 80,000 COLONIES/mL GROUP B STREP(S.AGALACTIAE)ISOLATED TESTING AGAINST S. AGALACTIAE NOT ROUTINELY PERFORMED DUE TO PREDICTABILITY OF AMP/PEN/VAN SUSCEPTIBILITY. Performed at [facility], [place] [address], [place], [place] [address]    Report Status 09/01/2017 FINAL  Final   Organism ID, Bacteria SERRATIA MARCESCENS (A)  Final      Susceptibility   Serratia marcescens - MIC*    CEFAZOLIN >=64 RESISTANT Resistant     CEFTRIAXONE <=1 SENSITIVE Sensitive     CIPROFLOXACIN 1 SENSITIVE Sensitive     GENTAMICIN <=1 SENSITIVE Sensitive     NITROFURANTOIN 256 RESISTANT Resistant     TRIMETH/SULFA <=20 SENSITIVE Sensitive     * >=100,000 COLONIES/mL SERRATIA MARCESCENS  Culture, blood (Routine X 2) w Reflex to ID Panel     Status: None   Collection Time: 08/30/17  3:26 PM  Result Value Ref Range Status   Specimen Description   Final    BLOOD LEFT HAND Performed at [facility], [place] [address] [place], [place] [address]    Special Requests   Final    IN PEDIATRIC BOTTLE Blood Culture adequate volume Performed at [facility] [address], [place], [place] [address]    Culture   Final    NO GROWTH  5 DAYS Performed at [facility], [place] [address], [place], [place] [address]    Report Status 09/04/2017 FINAL  Final  Culture, blood (Routine X 2) w Reflex to ID Panel     Status: None   Collection Time:  08/30/17  3:30 PM  Result Value Ref Range Status   Specimen Description   Final    BLOOD LEFT ANTECUBITAL Performed at [facility] [address], [place], [place] [address]    Special Requests   Final    IN PEDIATRIC BOTTLE Blood Culture adequate volume Performed at [facility] [address], [place], [place] [address]    Culture   Final    NO GROWTH 5 DAYS Performed at [facility], [place] [address] [place], [place] [address]    Report Status 09/04/2017 FINAL  Final  Culture, Urine     Status: Abnormal   Collection Time: 08/31/17  6:10 PM  Result Value Ref Range Status   Specimen Description   Final    URINE, CATHETERIZED Performed at [place] [address] [place], [place] [address]    Special Requests   Final    NONE Performed at [facility], [place] [address], [place], [place] [address]    Culture >=100,000 COLONIES/mL SERRATIA MARCESCENS (A)  Final   Report Status 09/03/2017 FINAL  Final   Organism ID, Bacteria SERRATIA MARCESCENS (A)  Final      Susceptibility   Serratia marcescens - MIC*    CEFAZOLIN >=64 RESISTANT Resistant     CEFTRIAXONE <=1 SENSITIVE Sensitive     CIPROFLOXACIN 1 SENSITIVE Sensitive     GENTAMICIN <=1 SENSITIVE Sensitive     NITROFURANTOIN 256 RESISTANT Resistant     TRIMETH/SULFA <=20 SENSITIVE Sensitive     * >=100,000 COLONIES/mL SERRATIA MARCESCENS  Urine Culture     Status: Abnormal (Preliminary result)   Collection Time: 09/02/17  5:07 PM  Result Value Ref Range Status   Specimen Description URINE, RANDOM KIDNEY RIGHT  Final   Special Requests NONE  Final   Culture (A)  Final    40,000 COLONIES/mL SERRATIA MARCESCENS 20,000 COLONIES/mL  GROUP B STREP(S.AGALACTIAE)ISOLATED TESTING AGAINST S. AGALACTIAE NOT ROUTINELY PERFORMED DUE TO PREDICTABILITY OF AMP/PEN/VAN SUSCEPTIBILITY. CULTURE REINCUBATED FOR BETTER GROWTH    Report Status PENDING  Incomplete   Organism ID, Bacteria SERRATIA MARCESCENS (A)  Final      Susceptibility   Serratia marcescens - MIC*    CEFAZOLIN >=64 RESISTANT Resistant     CEFTRIAXONE <=1 SENSITIVE Sensitive     CIPROFLOXACIN 1 SENSITIVE Sensitive     GENTAMICIN <=1 SENSITIVE Sensitive     NITROFURANTOIN 256 RESISTANT Resistant     TRIMETH/SULFA Value in next row Sensitive      <=20 SENSITIVEPerformed at [facility] [address] [place], [place] [address]    * 40,000 COLONIES/mL SERRATIA MARCESCENS  Urine Culture     Status: Abnormal   Collection Time: 09/02/17  5:07 PM  Result Value Ref Range Status   Specimen Description URINE, RANDOM  Final   Special Requests   Final    NONE Performed at [facility], [place] [address], [place], [place] [address]    Culture 80,000 COLONIES/mL SERRATIA MARCESCENS (A)  Final   Report Status 09/05/2017 FINAL  Final   Organism ID, Bacteria SERRATIA MARCESCENS (A)  Final      Susceptibility   Serratia marcescens - MIC*    CEFAZOLIN >=64 RESISTANT Resistant     CEFTRIAXONE <=1 SENSITIVE Sensitive     CIPROFLOXACIN 1 SENSITIVE Sensitive     GENTAMICIN <=1 SENSITIVE Sensitive     NITROFURANTOIN 256 RESISTANT Resistant     TRIMETH/SULFA <=20 SENSITIVE  Sensitive     * 80,000 COLONIES/mL SERRATIA MARCESCENS      Radiology Studies: No results found.  Scheduled Meds: . acetaminophen  1,000 mg Oral TID  . anastrozole  1 mg Oral Daily  . cholecalciferol  10,000 Units Oral Weekly  . enoxaparin (LOVENOX) injection  30 mg Subcutaneous Q24H  . famotidine  20 mg Oral Daily  . insulin aspart  0-20 Units Subcutaneous TID WC  . insulin aspart  0-5 Units Subcutaneous QHS  . insulin aspart  5 Units Subcutaneous TID WC  . insulin glargine  25 Units  Subcutaneous BID  . levothyroxine  150 mcg Oral QAC breakfast  . lip balm  1 application Topical BID  . metoCLOPramide  5 mg Oral TID AC  . polyvinyl alcohol  1 drop Both Eyes QHS  . pregabalin  75 mg Oral Daily  . prenatal multivitamin  1 tablet Oral Daily  . protein supplement shake  11 oz Oral BID BM  . psyllium  1 packet Oral Daily  . rosuvastatin  10 mg Oral QPM  . Tbo-Filgrastim  480 mcg Subcutaneous Weekly   Continuous Infusions: . sodium chloride    . ciprofloxacin 200 mg (09/05/17 1049)  . methocarbamol (ROBAXIN)  IV       LOS: 7 days   Time spent: 35 minutes.  Vance Gather, MD [facility] [email or web address] Password [email or web address] 09/05/2017, 2:08 PM

## 2017-09-05 NOTE — Progress Notes (Signed)
Subjective/Chief Complaint:  1 - Bladder Injury / Right Ureteral Stricture - s/p robotic RIGHT ureteral reimplant with psoas hitch + HM Flap 04/01/17 at time of colostomy take down / adhesiolysis, small bowel resection, loop iliostomy for right distal stricture / bladder injury sustained at pelvic resection for advanced endometrial cancer 2016. Stent removed 08/17/17. FU CT 08/25/17 with resolved right hydro.   2 - Metastatic Endometrial Cancer - s/p repeat resection [phone] with colon and vaginal involvement but negative nodes / margins. Had adjuvant chemo-XRT which she is now done with. Follows with Dr. Alvy Bimler.   3 - Acute on Chronic Renal Failure / Severe Dehydration - pt with high output iliostomy, difficult to maintain hydration, has been on PRN IVF. Cr 4's on admit late 08/2017 up from baseline <1.5. CT few days ago without hydro. Her BUN / Cr ratio has been 20 or more since iliostomy. Renal US1/29 with some mild (stable) caliectasis that is stable x months and no right hydro at all.  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   4 - Bacteruria / Malaise- pt with bacteruria on admit. No fevers / leukocytosis. Most recent clonal CX 07/2017 (when still had foley in place) serratia and enterococcus sens cipro. [name] 1/29 - serratia / pending.   5 - Rectal - Bladder Fistula - some drainage of yellowiosh thin fluid from peri-rectal area with standing per report new late 08/2017. CT cystogram 1/30 confims NEW bladder neck - rectal fistula with large volume urine transit into GI tract distal to loop iliostomy. Out remained high even with foley drainage). Bilat nep tubes placed 09/02/17 with now approx 2/3 urine drianing per neph tuebs and 1/3 per foley and scant per rectum.   PMH sig for DM2, morbid obesity, breast cancer, ovarian cancer, endometrial cancer.    Today "[name]" is  stable, she now has much less output per rectum with nepht tuebs and foley in place.    Objective: Vital signs in last 24 hours: Temp:  [98.3 F (36.8 C)-98.9 F (37.2 C)] 98.9 F (37.2 C) (02/04 0500) Pulse Rate:  [91-94] 91 (02/04 0500) Resp:  [18] 18 (02/04 0500) BP: (121-129)/(52-55) 121/55 (02/04 0500) SpO2:  [99 %-100 %] 99 % (02/04 0500) Weight:  [94.1 kg (207 lb 7.3 oz)] 94.1 kg (207 lb 7.3 oz) (02/04 0500) Last BM Date: 09/04/17  Intake/Output from previous day: 02/03 0701 - 02/04 0700 In: 340 [P.O.:240; IV Piggyback:100] Out: 4000 [Urine:3800; Stool:200] Intake/Output this shift: No intake/output data recorded.   General appearance: alert and with visible malaise and anxiety, husband at bedside as well.  Eyes: negative Nose: Nares normal. Septum midline. Mucosa normal. No drainage or sinus tenderness. Throat: lips, mucosa, and tongue normal; teeth and gums normal Neck: supple, symmetrical, trachea midline Back: symmetric, no curvature. ROM normal. No CVA tenderness. Bilat neph tubes in placed with clear yellos urine.  GI: stable large truncal obesity. Iliostomy patent of stool. Multiple scars w/o hernias.  Extremities: extremities normal, atraumatic, no cyanosis or edema Skin: Skin color, texture, turgor normal. No rashes or lesions Lymph nodes: Cervical, supraclavicular, and axillary nodes normal. Neurologic: Grossly normal  Lab Results:  Recent Labs    09/04/17 0416 09/05/17 0433  WBC 13.6* 5.6  HGB 8.6* 8.2*  HCT 26.7* 25.0*  PLT 268 272   BMET Recent Labs    09/04/17 0416 09/05/17 0433  NA 132* 132*  K 3.7 3.4*  CL 101 101  CO2 23 22  GLUCOSE 209* 239*  BUN 55* 52*  CREATININE 3.61* 3.00*  CALCIUM 8.4* 8.5*   PT/INR Recent Labs    09/02/17 0939  LABPROT 15.2  INR 1.21   ABG No results for input(s): PHART, HCO3 in the last 72 hours.  Invalid input(s): PCO2, PO2  Studies/Results: No results  found.  Anti-infectives: Anti-infectives (From admission, onward)   Start     Dose/Rate Route Frequency Ordered Stop   08/30/17 1000  ciprofloxacin (CIPRO) IVPB 200 mg     200 mg 100 mL/hr over 60 Minutes Intravenous Every 24 hours 08/30/17 0855        Assessment/Plan:  1 - Bladder Injury / Right Ureteral Stricture - imaging post-repair favorable, less right hydro than in years which suggests succesfull repair. Her mild left caliectasis is chronic and stable accounting for different imaging modalities.   2 - Metastatic Endometrial Cancer - no evidence of active disease, per med-oncology.   3 - Acute on Chronic Renal Failure / Severe Dehydration - her acute BUN and Cr rise appears to be mostly pre-renal + exacerbation / artifact from systemic reabsorption via contact with colon via fistula.   Greatly appreciate nephrology comanagment.   4 - Bacteruria - continue low dose cipro pending further CX data.    5 - Rectal - Bladder Fistula - improved diversion away from fistula with neph tubes and foley. All to remain for now as this making her hygeine and care managable.   Long term, she will likely need permanent GI and GU diversion when nutritionally replete.    Will follow  Please call me directly with questions.   [facility], Jodell Weitman 09/05/2017

## 2017-09-05 NOTE — Progress Notes (Signed)
[facility] Rounding Note  Subjective/Interval History:  Bilateral perc tubes 2/1 UOP 3.8 liters out yesterday Fevers better UCx 2/1 > 40K serratia marcescens / 20K GBS / > 100k lactobacillus / 50K yeast UCx 2/1 > serratia marsescens UCx 1/30 > serratia marsescens UCx 1/29 > serratia marsescens 100K / 80K GBS  Creat down 3.0 today from 3.6.  Hb 8.2  plts good.   I/O is negative 7 L overall .   Objective Vital signs in last 24 hours: Vitals:   09/04/17 1400 09/04/17 2051 09/05/17 0500 09/05/17 1400  BP: (!) 124/54 (!) 129/52 (!) 121/55 118/60  Pulse: 92 94 91 85  Resp: 18 18 18 17   Temp: 98.3 F (36.8 C) 98.5 F (36.9 C) 98.9 F (37.2 C) 98.6 F (37 C)  TempSrc: Oral Oral Oral Oral  SpO2: 100% 100% 99% 100%  Weight:   94.1 kg (207 lb 7.3 oz)   Height:   5\' 2"  (1.575 m)    Weight change: 0 kg (0 lb)  Intake/Output Summary (Last 24 hours) at 09/05/2017 1622 Last data filed at 09/05/2017 1500 Gross per 24 hour  Intake 0 ml  Output 2775 ml  Net -2775 ml   Physical examination BP 118/60 (BP Location: Right Leg)   Pulse 85   Temp 98.6 F (37 C) (Oral)   Resp 17   Ht 5\' 2"  (1.575 m)   Wt 94.1 kg (207 lb 7.3 oz)   SpO2 100%   BMI 37.94 kg/m   Pale WF BP stable Sats 100% Extremely pale Wearing O2 Normal heart sounds  S1S2 No S3 No murmur Some basliar crackles and decr R base breath sounds Abd with ileostomy bag that has leaked overnight Remains mildly tender in RLQ + BS Now only trace LE edema PICC R arm  Recent Labs  Lab 08/30/17 0623 08/31/17 0451 09/01/17 0406 09/02/17 0334 09/03/17 0455 09/04/17 0416 09/05/17 0433  NA 130* 128* 126* 125* 132* 132* 132*  K 3.0* 3.6 3.0* 3.3* 3.6 3.7 3.4*  CL 107 105 98* 94* 99* 101 101  CO2 10* 15* 19* 22 23 23 22   GLUCOSE 182* 221* 306* 357* 187* 209* 239*  BUN 81* 69* 61* 59* 56* 55* 52*  CREATININE 4.24* 3.60* 3.46* 3.61* 3.59* 3.61* 3.00*  CALCIUM 8.8* 8.1* 7.6* 7.6* 8.5* 8.4* 8.5*  PHOS  --  2.0* 2.5 2.5 3.8 3.8  3.4   Recent Labs  Lab 09/03/17 0455 09/04/17 0416 09/05/17 0433  ALBUMIN 1.9* 1.8* 1.8*    Recent Labs  Lab 09/01/17 0406 09/02/17 0334 09/03/17 0455 09/04/17 0416 09/05/17 0433  WBC 4.3 5.7 4.4 13.6* 5.6  NEUTROABS 3.1  --  3.3 11.8* 4.2  HGB 8.4* 8.1* 8.2* 8.6* 8.2*  HCT 24.5* 23.3* 24.9* 26.7* 25.0*  MCV 85.4 86.0 88.9 90.5 89.3  PLT 200 184 221 268 272    Recent Labs  Lab 09/04/17 1140 09/04/17 1631 09/04/17 2138 09/05/17 0734 09/05/17 1149  GLUCAP 369* 227* 307* 189* 410*   09/03/17 Mg 1.9 09/04/17 Mg 1.3  Studies/Results: No results found. Medications: . sodium chloride    . ciprofloxacin 200 mg (09/05/17 1049)  . methocarbamol (ROBAXIN)  IV     . acetaminophen  1,000 mg Oral TID  . anastrozole  1 mg Oral Daily  . cholecalciferol  10,000 Units Oral Weekly  . enoxaparin (LOVENOX) injection  30 mg Subcutaneous Q24H  . famotidine  20 mg Oral Daily  . insulin aspart  0-20 Units Subcutaneous TID  WC  . insulin aspart  0-5 Units Subcutaneous QHS  . insulin aspart  5 Units Subcutaneous TID WC  . insulin glargine  25 Units Subcutaneous BID  . levothyroxine  150 mcg Oral QAC breakfast  . lip balm  1 application Topical BID  . metoCLOPramide  5 mg Oral TID AC  . polyvinyl alcohol  1 drop Both Eyes QHS  . pregabalin  75 mg Oral Daily  . prenatal multivitamin  1 tablet Oral Daily  . protein supplement shake  11 oz Oral BID BM  . psyllium  1 packet Oral Daily  . rosuvastatin  10 mg Oral QPM  . Tbo-Filgrastim  480 mcg Subcutaneous Weekly   Assessment/Recommendations: 1. Acute kidney injury - On admission 2/2 inadequate intake in face of high output ileostomy. Some degree of hydro chronically. New urine leak identified with rectovesical fistula. Renal failure present since at least 08/17/17 with SCr 3 when ureteral stent removed (baseline 1.0 in November). Possible  ATN from prolonged volume depletion; can't exclude effects of mechanical obstruction long term.  pt  underwent bilateral perc tubes 2/1,excellent UOP.  Creat down today 1st time to 3.0.  Hopefully will cont to improve.   2. Metabolic acidosis  - on metformin with AKI.  Resolved post bicarb drip 3. Hypokalemia -Stable post repletion 4. Hypomagnesemia - Repleted 2/1 5. Anemia - Transfused 1 U 1/31. Fe stores VERY low but no IV Fe while on IV ATB's 6. Polymicrobial UTI - serratia and Group B strep. IV Cipro. BC's neg. Fevers better 7. Chronic neutropenia - last dosed herself with filagrastin on PM prior to admission (so 1/27).  Resumed yesterday to get her back on schedule 8. Depression/anxiety - per [facility]   Kelly Splinter MD [facility] pgr [phone]   09/05/2017, 4:29 PM

## 2017-09-05 NOTE — Progress Notes (Signed)
CBG, 410 paged MD, awaiting response.

## 2017-09-05 NOTE — Progress Notes (Signed)
Referring Physician(s): Manny,T  Supervising Physician: Arne Cleveland  Patient Status:  [facility] - In-pt  Chief Complaint: Bladder leak   Subjective: Pt sitting up in chair; sleepy; still with some leakge of fluid from rectum but not as much volume as before; has some bilat flank soreness   Allergies: Penicillins; Ultram [tramadol]; Adhesive [tape]; Cefaclor; Erythromycin; Trimethoprim; Ciprofloxacin; Fluconazole; Oxycodone; Pectin; and Sulfa antibiotics  Medications: Prior to Admission medications   Medication Sig Start Date End Date Taking? Authorizing Provider  anastrozole (ARIMIDEX) 1 MG tablet TAKE 1 TABLET DAILY 12/08/16  Yes Gorsuch, Ni, MD  Biotin 5 MG TABS Take 5 mg by mouth every morning.    Yes [provider]  Cholecalciferol (VITAMIN D3) 10000 UNITS capsule Take 10,000 Units by mouth once a week. Sunday evening's   Yes [provider]  diphenoxylate-atropine (LOMOTIL) 2.5-0.025 MG tablet Take 1-2 tablets by mouth every 8 (eight) hours. TO PREVENT LOOSE BOWEL MOVEMENTS 05/20/17  Yes [provider]  Famotidine-Ca Carb-Mag Hydrox (PEPCID COMPLETE PO) Take 1 tablet by mouth daily as needed (INDIGESTION).   Yes [provider]  filgrastim (NEUPOGEN) 480 MCG/1.6ML injection Inject 1.6 ml under the skin every 5 days for life Patient taking differently: Inject 480 mcg into the skin See admin instructions. Inject 1.6 ml under the skin every 6 days for life 01/06/17  Yes Gorsuch, Ni, MD  levothyroxine (SYNTHROID, LEVOTHROID) 150 MCG tablet Take 150 mcg by mouth daily before breakfast.    Yes [provider]  loratadine (CLARITIN) 10 MG tablet Take 10 mg by mouth every morning.    Yes [provider]  omega-3 acid ethyl esters (LOVAZA) 1 G capsule Take 1 g by mouth 2 (two) times daily.    Yes [provider]  ondansetron (ZOFRAN-ODT) 4 MG disintegrating tablet Take 4 mg by mouth every 6 (six) hours as needed for nausea or  vomiting.  04/12/17  Yes [provider]  Polyethyl Glycol-Propyl Glycol (SYSTANE OP) Place 1 drop into both eyes at bedtime.    Yes [provider]  Prenatal Vit-Fe Fumarate-FA (PRENATAL VITAMIN PO) Take 1 capsule by mouth daily. Takes prenatal because there are no dyes in it   Yes [provider]  prochlorperazine (COMPAZINE) 10 MG tablet Take 1 tablet (10 mg total) by mouth 2 (two) times daily as needed for nausea or vomiting. Patient taking differently: Take 10 mg by mouth 2 (two) times daily as needed for nausea or vomiting.  05/28/17  Yes Virgel Manifold, MD  promethazine (PHENERGAN) 25 MG tablet Take 1 tablet (25 mg total) by mouth every 6 (six) hours as needed for nausea or vomiting. 05/28/17  Yes Virgel Manifold, MD  ranitidine (ZANTAC) 150 MG tablet Take 150 mg by mouth 2 (two) times daily as needed for heartburn.    Yes [provider]  rosuvastatin (CRESTOR) 10 MG tablet Take 10 mg by mouth every evening.    Yes [provider]  silver sulfADIAZINE (SILVADENE) 1 % cream Apply 1 application topically daily. 08/10/17  Yes Shelda Pal, DO  sitaGLIPtin (JANUVIA) 100 MG tablet Take 100 mg by mouth every morning.    Yes [provider]  HYDROcodone-acetaminophen (NORCO/VICODIN) 5-325 MG tablet Take 1 tablet by mouth every 6 (six) hours as needed (Pain). Patient not taking: Reported on 08/29/2017 08/10/17   Shelda Pal, DO     Vital Signs: BP (!) 121/55 (BP Location: Right Leg)   Pulse 91   Temp 98.9  F (37.2 C) (Oral)   Resp 18   Ht _0  (1.575 m)   Wt 207 lb 7.3 oz (94.1 kg)   SpO2 99%   BMI 37.94 kg/m   Physical Exam bilat PCN's intact, dressings dry, outputs, 50/100 cc yellow urine, cx- serratia  Imaging: Ir Nephrostogram Left Initial Placement  Result Date: 09/02/2017 INDICATION: 62 year old female with a history bilateral ureteral obstruction. EXAM: IR NEPHROSTOGRAM INI PLACEMENT RIGHT; IR NEPHROSTOGRAM  INI PLACEMENT LEFT COMPARISON:  CT 08/31/2017 MEDICATIONS: Ciprofloxacin; The antibiotic was administered in an appropriate time frame prior to skin puncture. ANESTHESIA/SEDATION: Fentanyl 100 mcg IV; Versed 1.0 mg IV Moderate Sedation Time:  27 minutes The patient was continuously monitored during the procedure by the interventional radiology nurse under my direct supervision. CONTRAST:  20 cc-administered into the collecting system(s) FLUOROSCOPY TIME:  Fluoroscopy Time: 1 minutes 30 seconds (32 mGy). COMPLICATIONS: None PROCEDURE: Informed written consent was obtained from the patient after a thorough discussion of the procedural risks, benefits and alternatives. All questions were addressed. Maximal Sterile Barrier Technique was utilized including caps, mask, sterile gowns, sterile gloves, sterile drape, hand hygiene and skin antiseptic. A timeout was performed prior to the initiation of the procedure. Patient positioned prone position on the fluoroscopy table. The patient was then prepped and draped in the usual sterile fashion. 1% lidocaine was used to anesthetize the skin and subcutaneous tissues for local anesthesia. Left: Ultrasound survey of the left flank was performed with images stored and sent to PACs. A Chiba needle was then used to access a posterior inferior calyx with ultrasound guidance. With spontaneous urine returned through the needle, passage of an 018 micro wire into the collecting system was performed under fluoroscopy. A small incision was made with an 11 blade scalpel, and the needle was removed from the wire. An Accustick system was then advanced over the wire into the collecting system under fluoroscopy. The metal stiffener and inner dilator were removed, and then a sample of fluid was aspirated through the 4 French outer sheath. Bentson wire was passed into the collecting system and the sheath removed. Ten French dilation of the soft tissues was performed. Using modified Seldinger  technique, a 10 French pigtail catheter drain was placed over the Bentson wire. Wire and inner stiffener removed, and the pigtail was formed in the collecting system. Small amount of contrast confirmed position of the catheter. Right: Ultrasound survey of the right flank was performed with images stored and sent to PACs. A Chiba needle was then used to access a posterior inferior calyx with ultrasound guidance. With spontaneous urine returned through the needle, passage of an 018 micro wire into the collecting system was performed under fluoroscopy. A small incision was made with an 11 blade scalpel, and the needle was removed from the wire. An Accustick system was then advanced over the wire into the collecting system under fluoroscopy. The metal stiffener and inner dilator were removed, and then a sample of fluid was aspirated through the 4 French outer sheath. Bentson wire was passed into the collecting system and the sheath removed. Ten French dilation of the soft tissues was performed. Using modified Seldinger technique, a 10 French pigtail catheter drain was placed over the Bentson wire. Wire and inner stiffener removed, and the pigtail was formed in the collecting system. Small amount of contrast confirmed position of the catheter. Patient tolerated the procedure well and remained hemodynamically stable throughout. No complications were encountered and no significant blood loss encountered IMPRESSION: Status post  bilateral percutaneous nephrostomy. Signed, Dulcy Fanny. Earleen Newport, DO [facility] Radiology Electronically Signed   By: Corrie Mckusick D.O.   On: 09/02/2017 17:32   Ir Nephrostogram Right Initial Placement  Result Date: 09/02/2017 INDICATION: 62 year old female with a history bilateral ureteral obstruction. EXAM: IR NEPHROSTOGRAM INI PLACEMENT RIGHT; IR NEPHROSTOGRAM INI PLACEMENT LEFT COMPARISON:  CT 08/31/2017 MEDICATIONS: Ciprofloxacin; The antibiotic  was administered in an appropriate time frame prior to skin puncture. ANESTHESIA/SEDATION: Fentanyl 100 mcg IV; Versed 1.0 mg IV Moderate Sedation Time:  27 minutes The patient was continuously monitored during the procedure by the interventional radiology nurse under my direct supervision. CONTRAST:  20 cc-administered into the collecting system(s) FLUOROSCOPY TIME:  Fluoroscopy Time: 1 minutes 30 seconds (32 mGy). COMPLICATIONS: None PROCEDURE: Informed written consent was obtained from the patient after a thorough discussion of the procedural risks, benefits and alternatives. All questions were addressed. Maximal Sterile Barrier Technique was utilized including caps, mask, sterile gowns, sterile gloves, sterile drape, hand hygiene and skin antiseptic. A timeout was performed prior to the initiation of the procedure. Patient positioned prone position on the fluoroscopy table. The patient was then prepped and draped in the usual sterile fashion. 1% lidocaine was used to anesthetize the skin and subcutaneous tissues for local anesthesia. Left: Ultrasound survey of the left flank was performed with images stored and sent to PACs. A Chiba needle was then used to access a posterior inferior calyx with ultrasound guidance. With spontaneous urine returned through the needle, passage of an 018 micro wire into the collecting system was performed under fluoroscopy. A small incision was made with an 11 blade scalpel, and the needle was removed from the wire. An Accustick system was then advanced over the wire into the collecting system under fluoroscopy. The metal stiffener and inner dilator were removed, and then a sample of fluid was aspirated through the 4 French outer sheath. Bentson wire was passed into the collecting system and the sheath removed. Ten French dilation of the soft tissues was performed. Using modified Seldinger technique, a 10 French pigtail catheter drain was placed over the Bentson wire. Wire and inner  stiffener removed, and the pigtail was formed in the collecting system. Small amount of contrast confirmed position of the catheter. Right: Ultrasound survey of the right flank was performed with images stored and sent to PACs. A Chiba needle was then used to access a posterior inferior calyx with ultrasound guidance. With spontaneous urine returned through the needle, passage of an 018 micro wire into the collecting system was performed under fluoroscopy. A small incision was made with an 11 blade scalpel, and the needle was removed from the wire. An Accustick system was then advanced over the wire into the collecting system under fluoroscopy. The metal stiffener and inner dilator were removed, and then a sample of fluid was aspirated through the 4 French outer sheath. Bentson wire was passed into the collecting system and the sheath removed. Ten French dilation of the soft tissues was performed. Using modified Seldinger technique, a 10 French pigtail catheter drain was placed over the Bentson wire. Wire and inner stiffener removed, and the pigtail was formed in the collecting system. Small amount of contrast confirmed position of the catheter. Patient tolerated the procedure well and remained hemodynamically stable throughout. No complications were encountered and no significant blood loss encountered IMPRESSION: Status post bilateral percutaneous nephrostomy. Signed, Dulcy Fanny. Earleen Newport, DO [facility] Radiology Electronically Signed   By:  Corrie Mckusick D.O.   On: 09/02/2017 17:32    Labs:  CBC: Recent Labs    09/02/17 0334 09/03/17 0455 09/04/17 0416 09/05/17 0433  WBC 5.7 4.4 13.6* 5.6  HGB 8.1* 8.2* 8.6* 8.2*  HCT 23.3* 24.9* 26.7* 25.0*  PLT 184 221 268 272    COAGS: Recent Labs    04/16/17 0047 09/02/17 0939  INR 1.21 1.21    BMP: Recent Labs    09/02/17 0334 09/03/17 0455 09/04/17 0416 09/05/17 0433  NA 125* 132* 132* 132*  K  3.3* 3.6 3.7 3.4*  CL 94* 99* 101 101  CO2 _0 GLUCOSE 357* 187* 209* 239*  BUN 59* 56* 55* 52*  CALCIUM 7.6* 8.5* 8.4* 8.5*  CREATININE 3.61* 3.59* 3.61* 3.00*  GFRNONAA 12* 13* 12* 16*  GFRAA 14* 15* 14* 18*    LIVER FUNCTION TESTS: Recent Labs    04/20/17 0510 04/21/17 0416 05/27/17 1724 08/29/17 1550  09/02/17 0334 09/03/17 0455 09/04/17 0416 09/05/17 0433  BILITOT 0.4 0.5 0.3 0.5  --   --   --   --   --   AST 13* 13* 24 16  --   --   --   --   --   ALT 10* 10* 33 24  --   --   --   --   --   ALKPHOS 177* 190* 166* 173*  --   --   --   --   --   PROT 5.8* 5.9* 9.2* 6.8  --   --   --   --   --   ALBUMIN 1.8* 1.8* 3.9 2.1*   < > 1.4* 1.9* 1.8* 1.8*   < > = values in this interval not displayed.    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 Rowe Robert, PA-C 09/05/2017, 2:08 PM   I spent a total of 15 minutes at the the patient's bedside AND on the patient's hospital floor or unit, greater than 50% of which was counseling/coordinating care for bilateral nephrostomies    Patient ID: [name], female   DOB: [birth year], 62 y.o.   MRN: [id number]

## 2017-09-05 NOTE — Progress Notes (Signed)
[address] [place], [place] [address] Phone: [phone]  FAX: [phone]      Hunter Pinkard [id number] Mar 13, 1956  CARE TEAM:  PCP: Ann Held, DO  Outpatient Care Team: Patient Care Team: Carollee Herter, Alferd Apa, DO as PCP - General (Family Medicine) Heath Lark, MD as Consulting Physician (Hematology and Oncology) Everitt Amber, MD as Consulting Physician (Obstetrics and Gynecology) Michael [name], MD as Consulting Physician (General Surgery) Alexis Frock, MD as Consulting Physician (Urology) Altheimer, Legrand Como, MD as Consulting Physician (Endocrinology) Katy Apo, MD as Consulting Physician (Ophthalmology) Irene Limbo, MD as Consulting Physician (Plastic Surgery) Madelon Lips, MD as Consulting Physician (Nephrology)  Inpatient Treatment Team: Treatment Team: Attending Provider: Michael [name], MD; Consulting Physician: Alexis Frock, MD; Registered Nurse: Illene Regulus, RN; Technician: Reuel Derby, NT; Consulting Physician: [name], MD; Consulting Physician: Fatima Blank, MD; Consulting Physician: Michael [name], MD; Technician: Leda Quail, NT; Consulting Physician: Patrecia Pour, MD; Consulting Physician: Heath Lark, MD; Technician: Virgia Land, [place]; Technician: Estrellita Ludwig, NT; Rounding Team: Dorthy Cooler Radiology, MD; Consulting Physician: Edison Pace, Md, MD; Occupational Therapist: [name], OT; Case Manager: Guadalupe Maple, RN   Problem List:   Principal Problem:   Colovesical fistula - delayed Active Problems:   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 s/p resection/partial vaginectomy/ LAR/colostomy 11/19/2014   Ureteral stricture, right, 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) 04/01/2017   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   Primary hypothyroidism   DM type 2 (diabetes mellitus, type 2)  ([facility])   History of ovarian & endometrial cancer   Morbid obesity ([place])   Pelvic cancer s/p colostomy takedown/loop ileostomy diversion 04/01/2017   Protein-calorie malnutrition, moderate ([place])   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[place])   Chronic kidney disease (CKD), stage III (moderate) ([facility])   Dehydration   ARF (acute renal failure) ([place])   Adjustment disorder with mixed anxiety and depressed mood   Poorly controlled diabetes mellitus ([place])   Pressure injury of skin   History of external beam radiation therapy to pelvis  SURGERY 04/01/2017     POST-OPERATIVE DIAGNOSIS:   Recurrent endometrial cancer status post low anterior resection and partial pelvic exeneration  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   SURGEON:Alyzae Hawkey Gwynneth Aliment, MD- Primary (Dr Tresa Moore assist) PROCEDURE:  XI ROBOTIC LYSIS OF ADHESIONS x 5 hours XI ROBOTIC COLOSTOMY TAKEDOWN ROBOTIC SEWN COLOANAL ANASTOMOSIS Ileocecal resection with anastomosis Diverting loop ileostomy  SURGEON: Alexis Frock, MD - Primary (Dr Johney Maine assist) PROCEDURE:  Cystoscopy with R retrograde and stent exchange.  Robotic R ureteral reimplant with psoas bladder hitch.  Consults: urology   SURGERY 08/17/2017  DIAGNOSIS:  Right ureteral stricture, status post complex reconstruction.  PROCEDURES: 1. Cystoscopy with right retrograde pyelogram. 2. Removal of right ureteral stent.  PHYSICIAN:  Alexis Frock, MD        SPECIMENS:  Right ureteral stone for discard, inspected and intact.  FINDINGS: 1. Fusiform bladder shape consistent with prior psoas hitch. 2. Visibly patent ureteral-bladder anastomosis. 3. No evidence of hydronephrosis or narrowing with right retrograde     pyelogram.    Assessment  FAIR but improved  Ileus from bladder leak resolving.  Delayed bladder leak turned into colovesical fistula with preferential urinary drainage in decompressed diverted colon limb.  Plan:   Discussed  with urology.  Dr. Tresa Moore.  Discussed with nephrology, Dr. Lorrene Reid.  S/p percutaneous nephrostomy tubes to divert urine directly out of  kidneys and out of the bladder that is draining into her neorectum/colon.  Still needing Foley catheter.  Antibiotics per urology and nephrology.  Seems to be sensitive to ciprofloxacin.  Try Lyrica for chronic pain and depression.  Zoloft somehow discontinued.  Not by me.  Improve diabetic control.  Diabetes still poorly controlled.  [facility] consultation.  Agree with starting Lantus.  I increased it to 20 twice daily to try and more aggressively correct things.  I believe it is up to 25 units now.  Until her glucose is less than 150, she will never rally  Advance diet.  Bowel regimen.  Seems to be thickening up.    Ostomy care  -Hemoglobin low but stable in pancytopenic pt  -Continue neupogen injections for pancytopenia  -VTE prophylaxis: SCDs, etc  -Ambulate as tolerated to help recovery  Perhaps it can close down that way.  As mentioned before, I am highly skeptical that given her radiated bladder that had been aggressively treated with internal/external drainage and stenting and still developed a problem.  Most likely this is a leak on the left lateral inferiolateral corner of the cystostomy Heineke-Mikulicz and psoas hitch needed for the bladder to reach the right ureter at that had the chronic severe distal stricture.  I am highly skeptical that a radiated bladder with a delayed leak will spontaneously heal.  She had internal Foley drainage, external surgical drainage, internal stenting drainage for a few months, yet this happened.   Therefore, most likely will require surgical repair.  Standard of care usually on this situation is to resect colon & do a new distal anastomosis. Primarily repair the bladder & patch it.  However, there is no way I can redo anastomosis in a radiated pelvis with prior surgeries with prior anterior exoneration, radiation, 7-hour  lysis adhesions and prior surgery when she was in better shape.  Only options I can see is to try & primarily patch the colon with OMENTOPEXY, primarily repair the bladder with OMENTOPEXY, and hope it will heal.  I am skeptical that it will.  Most likely we will have to convert to an end colostomy to get the colon away from it and allow the bladder to be patched.  I do agree with Dr. Tresa Moore that she is rather malnourished and deconditioned at this point so would like to try and hold off on definitive repair if we can.  I do not think a permanent loop ileostomy is going to be a good solution for her as she cannot tolerate it well.  We will see.  She actually talked about converting to a colostomy.  She would really like it to be infraumbilical so she can hide it in her pants.  I have found trying to put colostomies through panniculis fails pretty badly.  Also risks increased parastomal hernia or risk with doing that.  I suspect that her bladder is probably not salvageable and she may need a completion cystectomy with ileal conduit.  I will defer to Dr. Zettie Pho expertise with urology.  We will discuss that later once she is in better shape to actually tolerate a reoperation should it come to that (very likely).    Patient and husband still hoping that she could be my patient.  I noted I will keep fighting with her despite her complicated medical and surgical history giving her more problems.  She is still quite emotionally devastated and exhausted.  She still gets tearful and nearly starts crying but is a little less  histrionic today.  I did note that it is reasonable to get a second opinion.  She short of brought this up on her own.  I think she is hoping for solution that avoids any bags.  I told her that is highly unlikely.  I tried to warn her she may end up with both a urostomy bag and a colostomy bag to eventually get through this if the bladder refuses to heal.  We will see.  They expressed understanding and  appreciation.  Hopefully we can get her to a better situation.    Home health nursing seems to have failed.  Lack of outpatient ostomy clinic in town here making outpatient management challenging.   I strongly suspect she will need a SNF.  Patient more motivated to get up and move around.  Perhaps she will be stronger and can transition to home health therapy     40 minutes spent in review, evaluation, examination, counseling, and coordination of care.  More than 50% of that time was spent in counseling.  [name], M.D., F.A.C.S. Gastrointestinal and Minimally Invasive Surgery [facility] [address], [place], [place] [address] [phone] Main / Paging    09/05/2017    Subjective: (Chief complaint)  Glad to be out of bed.  Less rectal drainage but not resolved.  Still needing Foley in both PERC nephrostomy tubes.  Husband at bedside.  Patient wondering if she should get a second opinion in the hopes that she can avoid permanent ostomies.  Complaining of pain at the nephrostomy tube sites.  Objective:  Vital signs:  Vitals:   09/04/17 0532 09/04/17 1400 09/04/17 2051 09/05/17 0500  BP: (!) 124/50 (!) 124/54 (!) 129/52 (!) 121/55  Pulse: 91 92 94 91  Resp: _0 Temp: 98.9 F (37.2 C) 98.3 F (36.8 C) 98.5 F (36.9 C) 98.9 F (37.2 C)  TempSrc: Oral Oral Oral Oral  SpO2: 100% 100% 100% 99%  Weight:    94.1 kg (207 lb 7.3 oz)  Height:    _1  (1.575 m)    Last BM Date: 09/04/17  Intake/Output   Yesterday:  02/03 0701 - 02/04 0700 In: 340 [P.O.:240; IV Piggyback:100] Out: 4000 [Urine:3800; Stool:200] This shift:  Total I/O In: -  Out: 450 [Urine:450]  Bowel function:  Flatus: YES   BM:  YES.  Thick oatmeal consistency succus in ileostomy bag.  Drain: No drains.     Physical Exam:  General: Pt awake/alert/oriented x4 in no acute distress Eyes: PERRL, normal EOM.  Sclera clear.  No  icterus Neuro: CN II-XII intact w/o focal sensory/motor deficits. Lymph: No head/neck/groin lymphadenopathy Psych:  No delerium/psychosis/paranoia.  Occasionally tearful.  More consolable than last week though.   HENT: Normocephalic, Mucus membranes moist.  No thrush Neck: Supple, No tracheal deviation Chest: No chest wall pain w good excursion CV:  Pulses intact.  Regular rhythm MS: Normal AROM mjr joints.  No obvious deformity.  Bilateral perc nephrostomy bags  Abdomen: Soft.  Nondistended.  No tenderness to palpation.  No evidence of peritonitis.  No incarcerated hernias.  No wounds.  Ileostomy pink with gas and succus in bag.      GU.  No purulent drainage or hematoma.  Foley in place with clear light yellow urine  Rectal deferred.  Last week had stricturing at coloanal anastomosis.  Had been finger dilated.  Held off on that today. Ext:   No deformity.  No  mjr edema.  No cyanosis Skin: No petechiae / purpura  Results:   Labs: Results for orders placed or performed during the hospital encounter of 08/29/17 (from the past 48 hour(s))  Glucose, capillary     Status: Abnormal   Collection Time: 09/03/17 12:16 PM  Result Value Ref Range   Glucose-Capillary 304 (H) 65 - 99 mg/dL  Glucose, capillary     Status: Abnormal   Collection Time: 09/03/17  4:51 PM  Result Value Ref Range   Glucose-Capillary 162 (H) 65 - 99 mg/dL  Glucose, capillary     Status: Abnormal   Collection Time: 09/03/17  9:47 PM  Result Value Ref Range   Glucose-Capillary 215 (H) 65 - 99 mg/dL  Magnesium     Status: Abnormal   Collection Time: 09/04/17  4:16 AM  Result Value Ref Range   Magnesium 1.3 (L) 1.7 - 2.4 mg/dL    Comment: Performed at [facility], [place] [address], [place], [place] [address]  Renal function panel     Status: Abnormal   Collection Time: 09/04/17  4:16 AM  Result Value Ref Range   Sodium 132 (L) 135 - 145 mmol/L   Potassium 3.7 3.5 - 5.1 mmol/L   Chloride 101 101  - 111 mmol/L   CO2 23 22 - 32 mmol/L   Glucose, Bld 209 (H) 65 - 99 mg/dL   BUN 55 (H) 6 - 20 mg/dL   Creatinine, Ser 3.61 (H) 0.44 - 1.00 mg/dL   Calcium 8.4 (L) 8.9 - 10.3 mg/dL   Phosphorus 3.8 2.5 - 4.6 mg/dL   Albumin 1.8 (L) 3.5 - 5.0 g/dL   GFR calc non Af Amer 12 (L) >60 mL/min   GFR calc Af Amer 14 (L) >60 mL/min    Comment: (NOTE) The eGFR has been calculated using the CKD EPI equation. This calculation has not been validated in all clinical situations. eGFR's persistently <60 mL/min signify possible Chronic Kidney Disease.    Anion gap 8 5 - 15    Comment: Performed at [facility], [place] [address] [place], [place] [address]  CBC with Differential/Platelet     Status: Abnormal   Collection Time: 09/04/17  4:16 AM  Result Value Ref Range   WBC 13.6 (H) 4.0 - 10.5 K/uL   RBC 2.95 (L) 3.87 - 5.11 MIL/uL   Hemoglobin 8.6 (L) 12.0 - 15.0 g/dL   HCT 26.7 (L) 36.0 - 46.0 %   MCV 90.5 78.0 - 100.0 fL   MCH 29.2 26.0 - 34.0 pg   MCHC 32.2 30.0 - 36.0 g/dL   RDW 15.2 11.5 - 15.5 %   Platelets 268 150 - 400 K/uL   Neutrophils Relative % 86 %   Lymphocytes Relative 4 %   Monocytes Relative 9 %   Eosinophils Relative 1 %   Basophils Relative 0 %   Neutro Abs 11.8 (H) 1.7 - 7.7 K/uL   Lymphs Abs 0.5 (L) 0.7 - 4.0 K/uL   Monocytes Absolute 1.2 (H) 0.1 - 1.0 K/uL   Eosinophils Absolute 0.1 0.0 - 0.7 K/uL   Basophils Absolute 0.0 0.0 - 0.1 K/uL   WBC Morphology INCREASED BANDS (>20% BANDS)     Comment: MILD LEFT SHIFT (1-5% METAS, OCC MYELO, OCC BANDS) TOXIC GRANULATION Performed at [facility] [place] [address], [place], [place] [address]   Glucose, capillary     Status: Abnormal   Collection Time: 09/04/17  7:43 AM  Result Value  Ref Range   Glucose-Capillary 162 (H) 65 - 99 mg/dL  Glucose, capillary     Status: Abnormal   Collection Time: 09/04/17 11:40 AM  Result Value Ref Range   Glucose-Capillary 369 (H) 65 - 99 mg/dL  Glucose,  capillary     Status: Abnormal   Collection Time: 09/04/17  4:31 PM  Result Value Ref Range   Glucose-Capillary 227 (H) 65 - 99 mg/dL  Glucose, capillary     Status: Abnormal   Collection Time: 09/04/17  9:38 PM  Result Value Ref Range   Glucose-Capillary 307 (H) 65 - 99 mg/dL  CBC with Differential/Platelet     Status: Abnormal   Collection Time: 09/05/17  4:33 AM  Result Value Ref Range   WBC 5.6 4.0 - 10.5 K/uL   RBC 2.80 (L) 3.87 - 5.11 MIL/uL   Hemoglobin 8.2 (L) 12.0 - 15.0 g/dL   HCT 25.0 (L) 36.0 - 46.0 %   MCV 89.3 78.0 - 100.0 fL   MCH 29.3 26.0 - 34.0 pg   MCHC 32.8 30.0 - 36.0 g/dL   RDW 15.2 11.5 - 15.5 %   Platelets 272 150 - 400 K/uL   Neutrophils Relative % 76 %   Lymphocytes Relative 10 %   Monocytes Relative 12 %   Eosinophils Relative 2 %   Basophils Relative 0 %   Neutro Abs 4.2 1.7 - 7.7 K/uL   Lymphs Abs 0.6 (L) 0.7 - 4.0 K/uL   Monocytes Absolute 0.7 0.1 - 1.0 K/uL   Eosinophils Absolute 0.1 0.0 - 0.7 K/uL   Basophils Absolute 0.0 0.0 - 0.1 K/uL   RBC Morphology POLYCHROMASIA PRESENT    WBC Morphology MILD LEFT SHIFT (1-5% METAS, OCC MYELO, OCC BANDS)     Comment: TOXIC GRANULATION DOHLE BODIES Performed at [facility], [place] [address], [place], [place] [address]   Magnesium     Status: None   Collection Time: 09/05/17  4:33 AM  Result Value Ref Range   Magnesium 2.0 1.7 - 2.4 mg/dL    Comment: Performed at [facility], [place] [address] [place], [place] [address]  Renal function panel     Status: Abnormal   Collection Time: 09/05/17  4:33 AM  Result Value Ref Range   Sodium 132 (L) 135 - 145 mmol/L   Potassium 3.4 (L) 3.5 - 5.1 mmol/L   Chloride 101 101 - 111 mmol/L   CO2 22 22 - 32 mmol/L   Glucose, Bld 239 (H) 65 - 99 mg/dL   BUN 52 (H) 6 - 20 mg/dL   Creatinine, Ser 3.00 (H) 0.44 - 1.00 mg/dL   Calcium 8.5 (L) 8.9 - 10.3 mg/dL   Phosphorus 3.4 2.5 - 4.6 mg/dL   Albumin 1.8 (L) 3.5 - 5.0 g/dL   GFR  calc non Af Amer 16 (L) >60 mL/min   GFR calc Af Amer 18 (L) >60 mL/min    Comment: (NOTE) The eGFR has been calculated using the CKD EPI equation. This calculation has not been validated in all clinical situations. eGFR's persistently <60 mL/min signify possible Chronic Kidney Disease.    Anion gap 9 5 - 15    Comment: Performed at [facility], [place] [address], [place], [place] [address]  Glucose, capillary     Status: Abnormal   Collection Time: 09/05/17  7:34 AM  Result Value Ref Range   Glucose-Capillary 189 (H) 65 - 99 mg/dL    Imaging / Studies: No  results found.  Medications / Allergies: per chart  Antibiotics: Anti-infectives (From admission, onward)   Start     Dose/Rate Route Frequency Ordered Stop   08/30/17 1000  ciprofloxacin (CIPRO) IVPB 200 mg     200 mg 100 mL/hr over 60 Minutes Intravenous Every 24 hours 08/30/17 0855          Note: Portions of this report may have been transcribed using voice recognition software. Every effort was made to ensure accuracy; however, inadvertent computerized transcription errors may be present.   Any transcriptional errors that result from this process are unintentional.     [name], M.D., F.A.C.S. Gastrointestinal and Minimally Invasive Surgery [facility] [address] [place], [place] [address] [phone] Main / Paging    09/05/2017

## 2017-09-05 NOTE — Progress Notes (Addendum)
Physical Therapy Treatment Patient Details Name: [name] MRN: [id number] DOB: 07-14-1956 Today's Date: 09/05/2017    History of Present Illness Pt is a 62 year old female with hx of bilateral mastectomies for breast cancer, chronic neutropenia,  h/o ovarian and endometrial CA, h/o colostomy 2016, w/takedown/loop ileostomy diversion as well as resection of ureteral stricture w/ bladder hitch reimplantation all in 03/2017. Pt had her stent removed 08/17/17 and admitted 08/29/17 for acute renal failure.    PT Comments    Pt was agreeable to working with therapy. She was tearful, during session, over current situation (multiple drains/lines and leakage situation). Mobility is hindered by significant leakage/drainage when pt stands. With time and effort, pt was able to help don mesh panties and pad for ambulation with therapy. Will continue to follow.     Follow Up Recommendations  Home health PT;Supervision/Assistance - 24 hour (with transition to OP for pelvic floor therapy (if MD is ok with this). Per chart, MD wants pelvic floor therapy. May need SNF for skilled nursing/medical management.      Equipment Recommendations  None recommended by PT    Recommendations for Other Services       Precautions / Restrictions Precautions Precautions: Fall Precaution Comments: Multiple lines (catheter, bil PCN tubes, O2?) Restrictions Weight Bearing Restrictions: No    Mobility  Bed Mobility               General bed mobility comments: oob in recliner  Transfers Overall transfer level: Needs assistance Equipment used: Rolling walker (2 wheeled) Transfers: Sit to/from Stand Sit to Stand: Min guard         General transfer comment: min/guard for safety. Increased time.  x2 due to difficulty managing leakage situation  Ambulation/Gait Ambulation/Gait assistance: Min guard Ambulation Distance (Feet): 115 Feet Assistive device: Rolling walker (2 wheeled) Gait Pattern/deviations:  Step-through pattern;Decreased stride length     General Gait Details: cues for safe use of RW. Slow gait speed. Remained on [place] O2 at pt's request.    Stairs            Wheelchair Mobility    Modified Rankin (Stroke Patients Only)       Balance Overall balance assessment: Needs assistance         Standing balance support: Bilateral upper extremity supported Standing balance-Leahy Scale: Poor Standing balance comment: requring RW currently                            Cognition Arousal/Alertness: Awake/alert   Overall Cognitive Status: Within Functional Limits for tasks assessed                                 General Comments: pt is very emotional about current situation. Became tearful over multiple lines and leakage situation      Exercises      General Comments        Pertinent Vitals/Pain Pain Assessment: Faces Faces Pain Scale: Hurts even more Pain Location: post procedure pain at tube sites Pain Descriptors / Indicators: Sore;Aching Pain Intervention(s): Monitored during session;Repositioned    Home Living                      Prior Function            PT Goals (current goals can now be found in the care plan section) Progress towards  PT goals: Progressing toward goals    Frequency    Min 3X/week      PT Plan Current plan remains appropriate    Co-evaluation              AM-PAC PT "6 Clicks" Daily Activity  Outcome Measure  Difficulty turning over in bed (including adjusting bedclothes, sheets and blankets)?: A Little Difficulty moving from lying on back to sitting on the side of the bed? : A Little Difficulty sitting down on and standing up from a chair with arms (e.g., wheelchair, bedside commode, etc,.)?: A Little Help needed moving to and from a bed to chair (including a wheelchair)?: A Little Help needed walking in hospital room?: A Little Help needed climbing 3-5 steps with a railing?  : A Lot 6 Click Score: 17    End of Session   Activity Tolerance: Patient limited by pain Patient left: in chair;with call bell/phone within reach   PT Visit Diagnosis: Difficulty in walking, not elsewhere classified (R26.2);Pain Pain - Right/Left: (trunk at drain sites)     Time: 9211-9417 PT Time Calculation (min) (ACUTE ONLY): 31 min  Charges:  $Gait Training: 8-22 mins $Therapeutic Activity: 8-22 mins                    G Codes:          Weston Anna, MPT Pager: [phone]

## 2017-09-05 NOTE — Progress Notes (Signed)
Lantus does increased to 25 units, syringe sent from pharmacy contains only 25 units, returned to pharmacy, waiting for correct dose to be sent

## 2017-09-06 LAB — GLUCOSE, CAPILLARY
GLUCOSE-CAPILLARY: 132 mg/dL — AB (ref 65–99)
GLUCOSE-CAPILLARY: 259 mg/dL — AB (ref 65–99)
Glucose-Capillary: 310 mg/dL — ABNORMAL HIGH (ref 65–99)
Glucose-Capillary: 204 mg/dL — ABNORMAL HIGH (ref 65–99)

## 2017-09-06 LAB — RENAL FUNCTION PANEL
ANION GAP: 8 (ref 5–15)
Albumin: 1.6 g/dL — ABNORMAL LOW (ref 3.5–5.0)
BUN: 48 mg/dL — ABNORMAL HIGH (ref 6–20)
CALCIUM: 8.4 mg/dL — AB (ref 8.9–10.3)
CO2: 22 mmol/L (ref 22–32)
CREATININE: 3.23 mg/dL — AB (ref 0.44–1.00)
Chloride: 101 mmol/L (ref 101–111)
GFR, EST AFRICAN AMERICAN: 16 mL/min — AB (ref >60)
GFR, EST NON AFRICAN AMERICAN: 14 mL/min — AB (ref >60)
Glucose, Bld: 156 mg/dL — ABNORMAL HIGH (ref 65–99)
PHOSPHORUS: 3.5 mg/dL (ref 2.5–4.6)
Potassium: 3.7 mmol/L (ref 3.5–5.1)
SODIUM: 131 mmol/L — AB (ref 135–145)

## 2017-09-06 LAB — MAGNESIUM: MAGNESIUM: 1.4 mg/dL — AB (ref 1.7–2.4)

## 2017-09-06 MED ORDER — LINAGLIPTIN 5 MG PO TABS
5.0000 mg | ORAL_TABLET | Freq: Every day | ORAL | Status: DC
  Administered 2017-09-06 – 2017-09-25 (×20): 5 mg via ORAL
  Filled 2017-09-06 (×21): qty 1

## 2017-09-06 MED ORDER — POLYETHYL GLYCOL-PROPYL GLYCOL 0.4-0.3 % OP GEL: Freq: Every day | OPHTHALMIC | Status: DC

## 2017-09-06 MED ORDER — SERTRALINE HCL 50 MG PO TABS
50.0000 mg | ORAL_TABLET | Freq: Every day | ORAL | Status: DC
  Administered 2017-09-07 – 2017-09-19 (×13): 50 mg via ORAL
  Filled 2017-09-06 (×14): qty 1

## 2017-09-06 MED ORDER — MAGNESIUM SULFATE 4 GM/100ML IV SOLN
4.0000 g | Freq: Once | INTRAVENOUS | Status: AC
  Administered 2017-09-06: 4 g via INTRAVENOUS
  Filled 2017-09-06: qty 100

## 2017-09-06 MED ORDER — LORATADINE 10 MG PO TABS
10.0000 mg | ORAL_TABLET | Freq: Every morning | ORAL | Status: DC
  Administered 2017-09-06 – 2017-09-12 (×7): 10 mg via ORAL
  Filled 2017-09-06 (×7): qty 1

## 2017-09-06 MED ORDER — ARTIFICIAL TEARS OPHTHALMIC OINT
TOPICAL_OINTMENT | Freq: Every day | OPHTHALMIC | Status: DC
  Administered 2017-09-06 – 2017-09-19 (×12): via OPHTHALMIC
  Administered 2017-09-20: 1 via OPHTHALMIC
  Administered 2017-09-24: 21:00:00 via OPHTHALMIC
  Filled 2017-09-06 (×2): qty 3.5

## 2017-09-06 MED ORDER — INSULIN ASPART 100 UNIT/ML ~~LOC~~ SOLN
7.0000 [IU] | Freq: Three times a day (TID) | SUBCUTANEOUS | Status: DC
  Administered 2017-09-06 – 2017-09-24 (×43): 7 [IU] via SUBCUTANEOUS

## 2017-09-06 MED ORDER — BIOTIN 5 MG PO TABS: 5.0000 mg | ORAL_TABLET | Freq: Every morning | ORAL | Status: DC

## 2017-09-06 NOTE — Progress Notes (Signed)
[facility]  [address] [place], [place] [address] Phone: [phone]  FAX: [phone]      [name] [id number] Apr 08, 1956  CARE TEAM:  PCP: Ann Held, DO  Outpatient Care Team: Patient Care Team: Carollee Herter, Alferd Apa, DO as PCP - General (Family Medicine) Heath Lark, MD as Consulting Physician (Hematology and Oncology) Everitt Amber, MD as Consulting Physician (Obstetrics and Gynecology) Michael [name], MD as Consulting Physician (General Surgery) Alexis Frock, MD as Consulting Physician (Urology) Altheimer, Legrand Como, MD as Consulting Physician (Endocrinology) Katy Apo, MD as Consulting Physician (Ophthalmology) Irene Limbo, MD as Consulting Physician (Plastic Surgery) Madelon Lips, MD as Consulting Physician (Nephrology)  Inpatient Treatment Team: Treatment Team: Attending Provider: Michael [name], MD; Consulting Physician: Alexis Frock, MD; Registered Nurse: Illene Regulus, RN; Technician: Reuel Derby, NT; Consulting Physician: [name], MD; Consulting Physician: Fatima Blank, MD; Consulting Physician: Michael [name], MD; Technician: Leda Quail, NT; Consulting Physician: Patrecia Pour, MD; Consulting Physician: Heath Lark, MD; Technician: Estrellita Ludwig, [place]; Rounding Team: Dorthy Cooler Radiology, MD; Consulting Physician: Edison Pace, Md, MD; Registered Nurse: Mortimer Fries, RN   Problem List:   Principal Problem:   Colovesical fistula - delayed Active Problems:   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 s/p resection/partial vaginectomy/ LAR/colostomy 11/19/2014   Ureteral stricture, right, s/p resection/reimplantation into bladder Beatris Si with Psoas Hitch) 04/01/2017   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   Primary hypothyroidism   DM type 2 (diabetes mellitus, type 2) ([place])   History of ovarian & endometrial cancer   Morbid obesity ([place])    Pelvic cancer s/p colostomy takedown/loop ileostomy diversion 04/01/2017   Protein-calorie malnutrition, moderate ([place])   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[place])   Chronic kidney disease (CKD), stage III (moderate) ([place])   Dehydration   ARF (acute renal failure) ([place])   Adjustment disorder with mixed anxiety and depressed mood   Poorly controlled diabetes mellitus ([place])   Pressure injury of skin   History of external beam radiation therapy to pelvis  SURGERY 04/01/2017     POST-OPERATIVE DIAGNOSIS:   Recurrent endometrial cancer status post low anterior resection and partial pelvic exeneration  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   SURGEON:Kristiane Morsch Gwynneth Aliment, MD- Primary (Dr Tresa Moore assist) PROCEDURE:  XI ROBOTIC LYSIS OF ADHESIONS x 5 hours XI ROBOTIC COLOSTOMY TAKEDOWN ROBOTIC SEWN COLOANAL ANASTOMOSIS Ileocecal resection with anastomosis Diverting loop ileostomy  SURGEON: Alexis Frock, MD - Primary (Dr Johney Maine assist) PROCEDURE:  Cystoscopy with R retrograde and stent exchange.  Robotic R ureteral reimplant with psoas bladder hitch.  Consults: urology   SURGERY 08/17/2017  DIAGNOSIS:  Right ureteral stricture, status post complex reconstruction.  PROCEDURES: 1. Cystoscopy with right retrograde pyelogram. 2. Removal of right ureteral stent.  PHYSICIAN:  Alexis Frock, MD        SPECIMENS:  Right ureteral stone for discard, inspected and intact.  FINDINGS: 1. Fusiform bladder shape consistent with prior psoas hitch. 2. Visibly patent ureteral-bladder anastomosis. 3. No evidence of hydronephrosis or narrowing with right retrograde     pyelogram.    Assessment  Improved  Delayed bladder leak turned into colovesical fistula with preferential urinary drainage in decompressed diverted colon limb.  Plan:   Discussed with urology.  Dr. Tresa Moore.  Discussed with nephrology, Dr. Lorrene Reid.  S/p percutaneous nephrostomy tubes to divert urine directly  out of kidneys and out of the bladder that is draining into her neorectum/colon.  Still needing Foley catheter but improved.  Will stop Antibiotics per urology.  Follow off Cipro  Lyrica for chronic pain and depression.  Zoloft somehow discontinued.  Not by me.  Improve diabetic control.  Diabetes still poorly controlled.  [facility] consultation.  Agree with starting Lantus.  I increased it to 20 twice daily to try and more aggressively correct things.  I believe it is up to 25 units now.  I restarted  Januvia if OK w TRH & NephrologyUntil her glucose is less than 150, she will never rally  Solid diet.  Bowel regimen.  Seems to be thickening up.    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  As mentioned before, I am highly skeptical that given her radiated bladder that had been aggressively treated with internal/external drainage and stenting and still developed a problem.  Most likely this is a leak on the left lateral inferiolateral corner of the cystostomy Heineke-Mikulicz and psoas hitch needed for the bladder to reach the right ureter at that had the chronic severe distal stricture.  I am highly skeptical that a radiated bladder with a delayed leak will spontaneously heal.  She had internal Foley drainage, external surgical drainage, internal stenting drainage for a few months, yet this happened.   Therefore, most likely will require surgical repair.  Standard of care usually on this situation is to resect colon & do a new distal anastomosis. Primarily repair the bladder & patch it.  However, there is no way I can redo anastomosis in a radiated pelvis with prior surgeries with prior anterior exoneration, radiation, 7-hour lysis adhesions and prior surgery when she was in better shape.  Only options I can see is to try &  primarily patch the colon with OMENTOPEXY, primarily repair the bladder with OMENTOPEXY, and hope it will heal.  I am skeptical that it will.  Most likely we will have to convert to an end colostomy to get the colon away from it and allow the bladder to be patched.  I do agree with Dr. Tresa Moore that she is rather malnourished and deconditioned at this point so would like to try and hold off on definitive repair if we can.  I do not think a permanent loop ileostomy is going to be a good solution for her as she cannot tolerate it well.  We will see.  She actually talked about converting to a colostomy.  She would really like it to be infraumbilical so she can hide it in her pants.  I have found trying to put colostomies through panniculis fails pretty badly.  Also risks increased parastomal hernia or risk with doing that.  I suspect that her bladder is probably not salvageable and she may need a completion cystectomy with ileal conduit.  I will defer to Dr. Zettie Pho expertise with urology.  We will discuss that later once she is in better shape to actually tolerate a reoperation should it come to that (very likely).    Patient and husband still hoping that she could be my patient.  I noted I will keep fighting with her despite her complicated medical and surgical history giving her more problems.  She is still quite emotionally devastated and exhausted.  She still gets tearful and nearly starts crying but is a little less histrionic today.  I did note that it is reasonable to  get a second opinion.  She short of brought this up on her own.  I think she is hoping for solution that avoids any bags.  I told her that is highly unlikely.  I tried to warn her she may end up with both a urostomy bag and a colostomy bag to eventually get through this if the bladder refuses to heal.  We will see.  They expressed understanding and appreciation.  Hopefully we can get her to a better situation.    Home health nursing seems to  have failed.  Lack of outpatient ostomy clinic in town here making outpatient management challenging.   I strongly suspect she will need a SNF.  Patient more motivated to get up and move around.  She is still embarrassed in frustrated with persistent urine leak out anus.  But she is getting up some.  Work to try and transition to outpatient rehab with SNF later in the week      40 minutes spent in review, evaluation, examination, counseling, and coordination of care.  More than 50% of that time was spent in counseling.  [name], M.D., F.A.C.S. Gastrointestinal and Minimally Invasive Surgery [facility] [address] [place], [place] [address] [phone] Main / Paging    09/06/2017    Subjective: (Chief complaint)  Up in chair at least 12 hours.  Walked in hallways once but still some urine rectal drainage.  This is the most frustrating thing for her.  Still needing Foley in both perc nephrostomy tubes. Eating better Husband at bedside.   Complaining of pain at the nephrostomy tube sites.  Objective:  Vital signs:  Vitals:   09/06/17 0400 09/06/17 0432 09/06/17 0443 09/06/17 0540  BP:    (!) 114/47  Pulse:    98  Resp:    18  Temp: 99.7 F (37.6 C) 98.9 F (37.2 C)  98.9 F (37.2 C)  TempSrc: Oral Oral  Oral  SpO2:    100%  Weight:   91.3 kg (201 lb 4.5 oz)   Height:        Last BM Date: 09/04/17  Intake/Output   Yesterday:  02/04 0701 - 02/05 0700 In: 720 [P.O.:620; IV Piggyback:100] Out: 5001 [Urine:3901; Stool:1100] This shift:  No intake/output data recorded.  Bowel function:  Flatus: YES   BM:  YES.  Thick oatmeal consistency succus in ileostomy bag.  Drain: No drains.     Physical Exam:  General: Pt awake/alert/oriented x4 in no acute distress Eyes: PERRL, normal EOM.  Sclera clear.  No icterus Neuro: CN II-XII intact w/o focal sensory/motor deficits. Lymph: No head/neck/groin  lymphadenopathy Psych:  No delerium/psychosis/paranoia.  Occasionally tearful.  More consolable than last week though.   HENT: Normocephalic, Mucus membranes moist.  No thrush Neck: Supple, No tracheal deviation Chest: No chest wall pain w good excursion CV:  Pulses intact.  Regular rhythm MS: Normal AROM mjr joints.  No obvious deformity.  Bilateral perc nephrostomy bags  Abdomen: Soft.  Nondistended.  No tenderness to palpation.  No evidence of peritonitis.  No incarcerated hernias.  No wounds.  Ileostomy pink with gas and succus in bag.      GU.  No purulent drainage or hematoma.  Foley in place with clear light yellow urine  Rectal deferred.  Last week had stricturing at coloanal anastomosis.  Had been finger dilated.  Held off on that today. Ext:   No deformity.  No mjr edema.  No  cyanosis Skin: No petechiae / purpura  Results:   Labs: Results for orders placed or performed during the hospital encounter of 08/29/17 (from the past 48 hour(s))  Glucose, capillary     Status: Abnormal   Collection Time: 09/04/17 11:40 AM  Result Value Ref Range   Glucose-Capillary 369 (H) 65 - 99 mg/dL  Glucose, capillary     Status: Abnormal   Collection Time: 09/04/17  4:31 PM  Result Value Ref Range   Glucose-Capillary 227 (H) 65 - 99 mg/dL  Glucose, capillary     Status: Abnormal   Collection Time: 09/04/17  9:38 PM  Result Value Ref Range   Glucose-Capillary 307 (H) 65 - 99 mg/dL  CBC with Differential/Platelet     Status: Abnormal   Collection Time: 09/05/17  4:33 AM  Result Value Ref Range   WBC 5.6 4.0 - 10.5 K/uL   RBC 2.80 (L) 3.87 - 5.11 MIL/uL   Hemoglobin 8.2 (L) 12.0 - 15.0 g/dL   HCT 25.0 (L) 36.0 - 46.0 %   MCV 89.3 78.0 - 100.0 fL   MCH 29.3 26.0 - 34.0 pg   MCHC 32.8 30.0 - 36.0 g/dL   RDW 15.2 11.5 - 15.5 %   Platelets 272 150 - 400 K/uL   Neutrophils Relative % 76 %   Lymphocytes Relative 10 %   Monocytes Relative 12 %   Eosinophils Relative 2 %   Basophils  Relative 0 %   Neutro Abs 4.2 1.7 - 7.7 K/uL   Lymphs Abs 0.6 (L) 0.7 - 4.0 K/uL   Monocytes Absolute 0.7 0.1 - 1.0 K/uL   Eosinophils Absolute 0.1 0.0 - 0.7 K/uL   Basophils Absolute 0.0 0.0 - 0.1 K/uL   RBC Morphology POLYCHROMASIA PRESENT    WBC Morphology MILD LEFT SHIFT (1-5% METAS, OCC MYELO, OCC BANDS)     Comment: TOXIC GRANULATION DOHLE BODIES Performed at [facility], [name] [address], [place], [place] [address]   Magnesium     Status: None   Collection Time: 09/05/17  4:33 AM  Result Value Ref Range   Magnesium 2.0 1.7 - 2.4 mg/dL    Comment: Performed at [facility], [place] [address] [place], [place] [address]  Renal function panel     Status: Abnormal   Collection Time: 09/05/17  4:33 AM  Result Value Ref Range   Sodium 132 (L) 135 - 145 mmol/L   Potassium 3.4 (L) 3.5 - 5.1 mmol/L   Chloride 101 101 - 111 mmol/L   CO2 22 22 - 32 mmol/L   Glucose, Bld 239 (H) 65 - 99 mg/dL   BUN 52 (H) 6 - 20 mg/dL   Creatinine, Ser 3.00 (H) 0.44 - 1.00 mg/dL   Calcium 8.5 (L) 8.9 - 10.3 mg/dL   Phosphorus 3.4 2.5 - 4.6 mg/dL   Albumin 1.8 (L) 3.5 - 5.0 g/dL   GFR calc non Af Amer 16 (L) >60 mL/min   GFR calc Af Amer 18 (L) >60 mL/min    Comment: (NOTE) The eGFR has been calculated using the CKD EPI equation. This calculation has not been validated in all clinical situations. eGFR's persistently <60 mL/min signify possible Chronic Kidney Disease.    Anion gap 9 5 - 15    Comment: Performed at [facility], [place] [address] [place], [place] [address]  Glucose, capillary     Status: Abnormal   Collection Time: 09/05/17  7:34 AM  Result Value Ref Range  Glucose-Capillary 189 (H) 65 - 99 mg/dL  Glucose, capillary     Status: Abnormal   Collection Time: 09/05/17 11:49 AM  Result Value Ref Range   Glucose-Capillary 410 (H) 65 - 99 mg/dL  Glucose, capillary     Status: Abnormal   Collection Time: 09/05/17  5:29 PM  Result  Value Ref Range   Glucose-Capillary 295 (H) 65 - 99 mg/dL  Glucose, capillary     Status: Abnormal   Collection Time: 09/05/17  9:45 PM  Result Value Ref Range   Glucose-Capillary 248 (H) 65 - 99 mg/dL  Magnesium     Status: Abnormal   Collection Time: 09/06/17  3:48 AM  Result Value Ref Range   Magnesium 1.4 (L) 1.7 - 2.4 mg/dL    Comment: Performed at [facility], [place] [address] [place], [place] [address]  Renal function panel     Status: Abnormal   Collection Time: 09/06/17  3:48 AM  Result Value Ref Range   Sodium 131 (L) 135 - 145 mmol/L   Potassium 3.7 3.5 - 5.1 mmol/L   Chloride 101 101 - 111 mmol/L   CO2 22 22 - 32 mmol/L   Glucose, Bld 156 (H) 65 - 99 mg/dL   BUN 48 (H) 6 - 20 mg/dL   Creatinine, Ser 3.23 (H) 0.44 - 1.00 mg/dL   Calcium 8.4 (L) 8.9 - 10.3 mg/dL   Phosphorus 3.5 2.5 - 4.6 mg/dL   Albumin 1.6 (L) 3.5 - 5.0 g/dL   GFR calc non Af Amer 14 (L) >60 mL/min   GFR calc Af Amer 16 (L) >60 mL/min    Comment: (NOTE) The eGFR has been calculated using the CKD EPI equation. This calculation has not been validated in all clinical situations. eGFR's persistently <60 mL/min signify possible Chronic Kidney Disease.    Anion gap 8 5 - 15    Comment: Performed at [facility], [place] [address] [place], [place] [address]  Glucose, capillary     Status: Abnormal   Collection Time: 09/06/17  7:21 AM  Result Value Ref Range   Glucose-Capillary 132 (H) 65 - 99 mg/dL    Imaging / Studies: No results found.  Medications / Allergies: per chart  Antibiotics: Anti-infectives (From admission, onward)   Start     Dose/Rate Route Frequency Ordered Stop   08/30/17 1000  ciprofloxacin (CIPRO) IVPB 200 mg     200 mg 100 mL/hr over 60 Minutes Intravenous Every 24 hours 08/30/17 0855          Note: Portions of this report may have been transcribed using voice recognition software. Every effort was made to ensure accuracy; however,  inadvertent computerized transcription errors may be present.   Any transcriptional errors that result from this process are unintentional.     [name], M.D., F.A.C.S. Gastrointestinal and Minimally Invasive Surgery [facility] [address] [place], [place] [address] [phone] Main / Paging    09/06/2017

## 2017-09-06 NOTE — Progress Notes (Signed)
Subjective/Chief Complaint:   1 - Bladder Injury / Right Ureteral Stricture - s/p robotic RIGHT ureteral reimplant with psoas hitch + HM Flap 04/01/17 at time of colostomy take down / adhesiolysis, small bowel resection, loop iliostomy for right distal stricture / bladder injury sustained at pelvic resection for advanced endometrial cancer 2016. Stent removed 08/17/17. FU CT 08/25/17 with resolved right hydro.   2 - Metastatic Endometrial Cancer - s/p repeat resection [phone] with colon and vaginal involvement but negative nodes / margins. Had adjuvant chemo-XRT which she is now done with. Follows with Dr. Alvy Bimler.   3 - Acute on Chronic Renal Failure / Severe Dehydration - pt with high output iliostomy, difficult to maintain hydration, has been on PRN IVF. Cr 4's on admit late 08/2017 up from baseline <1.5. CT few days ago without hydro. Her BUN / Cr ratio has been 20 or more since iliostomy. Renal US1/29 with some mild (stable) caliectasis that is stable x months and no right hydro at all.  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   4 - Bacteruria / Malaise- pt with bacteruria on admit. No fevers / leukocytosis. Most recent clonal CX 07/2017 (when still had foley in place) serratia and enterococcus sens cipro. [name] 1/29 - serratia / pending.   5 - Rectal - Bladder Fistula - some drainage of yellowiosh thin fluid from peri-rectal area with standing per report new late 08/2017. CT cystogram 1/30 confims NEW bladder neck - rectal fistula with large volume urine transit into GI tract distal to loop iliostomy. Out remained high even with foley drainage). Bilat nep tubes placed 09/02/17 with now approx 2/3 urine drianing per neph tuebs and 1/3 per foley and muc less per rectum.   PMH sig for DM2, morbid obesity, breast cancer, ovarian cancer, endometrial cancer.    Today "[name]" is  without complaints. Still some rectal drainage but improved.    Objective: Vital signs in last 24 hours: Temp:  [98.6 F (37 C)-99.7 F (37.6 C)] 98.9 F (37.2 C) (02/05 0540) Pulse Rate:  [76-98] 98 (02/05 0540) Resp:  [17-18] 18 (02/05 0540) BP: (114-118)/(47-60) 114/47 (02/05 0540) SpO2:  [100 %] 100 % (02/05 0540) Weight:  [91.3 kg (201 lb 4.5 oz)] 91.3 kg (201 lb 4.5 oz) (02/05 0443) Last BM Date: 09/04/17  Intake/Output from previous day: 02/04 0701 - 02/05 0700 In: 720 [P.O.:620; IV Piggyback:100] Out: 5001 [Urine:3901; Stool:1100] Intake/Output this shift: No intake/output data recorded.  General appearance: alert and with visible malaise and anxiety, husband at bedside as well.  Eyes: negative Nose: Nares normal. Septum midline. Mucosa normal. No drainage or sinus tenderness. Throat: lips, mucosa, and tongue normal; teeth and gums normal Neck: supple, symmetrical, trachea midline Back: symmetric, no curvature. ROM normal. No CVA tenderness. Bilat neph tubes in placed with clear yellos urine.  GI: stable large truncal obesity. Iliostomy patent of stool. Multiple scars w/o hernias.  Extremities: extremities normal, atraumatic, no cyanosis or edema Skin: Skin color, texture, turgor normal. No rashes or lesions Lymph nodes: Cervical, supraclavicular, and axillary nodes normal. Neurologic: Grossly normal FOley in place with yellow urine mixed with some mucus.  Lab Results:  Recent Labs    09/04/17 0416 09/05/17 0433  WBC 13.6* 5.6  HGB 8.6* 8.2*  HCT 26.7* 25.0*  PLT 268 272   BMET Recent Labs    09/05/17 0433 09/06/17 0348  NA  132* 131*  K 3.4* 3.7  CL 101 101  CO2 22 22  GLUCOSE 239* 156*  BUN 52* 48*  CREATININE 3.00* 3.23*  CALCIUM 8.5* 8.4*   PT/INR No results for input(s): LABPROT, INR in the last 72 hours. ABG No results for input(s): PHART, HCO3 in the last 72 hours.  Invalid input(s): PCO2, PO2  Studies/Results: No results  found.  Anti-infectives: Anti-infectives (From admission, onward)   Start     Dose/Rate Route Frequency Ordered Stop   08/30/17 1000  ciprofloxacin (CIPRO) IVPB 200 mg     200 mg 100 mL/hr over 60 Minutes Intravenous Every 24 hours 08/30/17 0855        Assessment/Plan:  1 - Bladder Injury / Right Ureteral Stricture - imaging post-repair favorable, less right hydro than in years which suggests succesfull repair. Her mild left caliectasis is chronic and stable accounting for different imaging modalities.   2 - Metastatic Endometrial Cancer - no evidence of active disease, per med-oncology.   3 - Acute on Chronic Renal Failure / Severe Dehydration - her acute BUN and Cr rise appears to be mostly pre-renal + exacerbation / artifact from systemic reabsorption via contact with colon via fistula. This is improved but not back to baseline with drainage away from fistula.  Greatly appreciate nephrology comanagment.   4 - Bacteruria - I feel ABX can be stopped at anytime. She has cleared systemic infectious paramaters.    5 - Rectal - Bladder Fistula - improved diversion away from fistula with neph tubes and foley. All to remain for now as this making her hygeine and care managable.   Long term, she will likely need permanent GI and GU diversion when nutritionally replete.   Agree with likely SNF placement given level of deconditioning.  Will follow  Please call me directly with questions.   [facility], Jatia Musa 09/06/2017

## 2017-09-06 NOTE — Progress Notes (Signed)
[facility] Rounding Note  Subjective/Interval History:  Bilateral perc tubes 2/1 Fevers better Creat 3.4 today, pt feeling better, walking with PT  Objective Vital signs in last 24 hours: Vitals:   09/06/17 0443 09/06/17 0540 09/06/17 0920 09/06/17 1500  BP:  (!) 114/47 (!) 110/56 (!) 132/58  Pulse:  98 98 81  Resp:  18 18 18   Temp:  98.9 F (37.2 C) 99.9 F (37.7 C) 98.6 F (37 C)  TempSrc:  Oral Oral Oral  SpO2:  100% 98% 100%  Weight: 91.3 kg (201 lb 4.5 oz)     Height:       Weight change: -2.8 kg (-2.8 oz)  Intake/Output Summary (Last 24 hours) at 09/06/2017 1743 Last data filed at 09/06/2017 1738 Gross per 24 hour  Intake 2750 ml  Output 4931 ml  Net -2181 ml   Physical examination BP (!) 132/58 (BP Location: Right Leg)   Pulse 81   Temp 98.6 F (37 C) (Oral)   Resp 18   Ht 5\' 2"  (1.575 m)   Wt 91.3 kg (201 lb 4.5 oz)   SpO2 100%   BMI 36.81 kg/m   Pale WF BP stable Alert, looks better No jvd Chest clear bilat RRR no mrg Abd with ileostomy bag that has leaked overnight Remains mildly tender in RLQ, +BS Now only trace LE edema PICC R arm  Assessment/Recommendations: 1. Acute kidney injury - On admission 2/2 inadequate intake in face of high output ileostomy. Some degree of hydro chronically. New urine leak identified with rectovesical fistula. Renal failure present since at least 08/17/17 with SCr 3 when ureteral stent removed (baseline 1.0 in November). Possible  ATN from prolonged volume depletion; can't exclude effects of mechanical obstruction long term.  pt underwent bilateral perc tubes 2/1,excellent UOP.  Creat bouncing around in the 3's range.  Will follow.   2. Metabolic acidosis  - Resolved post bicarb drip 3. Hypokalemia -Stable post repletion 4. Hypomagnesemia - Repleted 2/1 5. Anemia - Transfused 1 U 1/31. Fe stores VERY low but no IV Fe while on IV ATB's 6. Polymicrobial UTI - serratia and Group B strep. IV Cipro. BC's neg. Fevers better 7. Chronic  neutropenia - back on Rx  8. Depression/anxiety - per [facility]   Kelly Splinter MD [facility] pgr [phone]   09/06/2017, 5:43 PM  Recent Labs  Lab 08/31/17 0451 09/01/17 0406 09/02/17 0334 09/03/17 0455 09/04/17 0416 09/05/17 0433 09/06/17 0348  NA 128* 126* 125* 132* 132* 132* 131*  K 3.6 3.0* 3.3* 3.6 3.7 3.4* 3.7  CL 105 98* 94* 99* 101 101 101  CO2 15* 19* 22 23 23 22 22   GLUCOSE 221* 306* 357* 187* 209* 239* 156*  BUN 69* 61* 59* 56* 55* 52* 48*  CREATININE 3.60* 3.46* 3.61* 3.59* 3.61* 3.00* 3.23*  CALCIUM 8.1* 7.6* 7.6* 8.5* 8.4* 8.5* 8.4*  PHOS 2.0* 2.5 2.5 3.8 3.8 3.4 3.5   Recent Labs  Lab 09/04/17 0416 09/05/17 0433 09/06/17 0348  ALBUMIN 1.8* 1.8* 1.6*    Recent Labs  Lab 09/01/17 0406 09/02/17 0334 09/03/17 0455 09/04/17 0416 09/05/17 0433  WBC 4.3 5.7 4.4 13.6* 5.6  NEUTROABS 3.1  --  3.3 11.8* 4.2  HGB 8.4* 8.1* 8.2* 8.6* 8.2*  HCT 24.5* 23.3* 24.9* 26.7* 25.0*  MCV 85.4 86.0 88.9 90.5 89.3  PLT 200 184 221 268 272    Recent Labs  Lab 09/05/17 1729 09/05/17 2145 09/06/17 0721 09/06/17 1159 09/06/17 1719  GLUCAP 295* 248* 132* 259* 310*   09/03/17 Mg 1.9 09/04/17 Mg 1.3  Studies/Results: No results found. Medications: . sodium chloride    . methocarbamol (ROBAXIN)  IV     . acetaminophen  1,000 mg Oral TID  . anastrozole  1 mg Oral Daily  . artificial tears   Both Eyes QHS  . cholecalciferol  10,000 Units Oral Weekly  . enoxaparin (LOVENOX) injection  30 mg Subcutaneous Q24H  . famotidine  20 mg Oral Daily  . insulin aspart  0-20 Units Subcutaneous TID WC  . insulin aspart  0-5 Units Subcutaneous QHS  . insulin aspart  7 Units Subcutaneous TID WC  . insulin glargine  25 Units Subcutaneous BID  . levothyroxine  150 mcg Oral QAC breakfast  . linagliptin  5 mg Oral Daily  . lip balm  1 application Topical BID  . loratadine  10 mg Oral q morning - 10a  . metoCLOPramide  5 mg Oral TID AC  . polyvinyl alcohol   1 drop Both Eyes QHS  . pregabalin  75 mg Oral Daily  . prenatal multivitamin  1 tablet Oral Daily  . protein supplement shake  11 oz Oral BID BM  . psyllium  1 packet Oral Daily  . rosuvastatin  10 mg Oral QPM  . sertraline  50 mg Oral Daily  . Tbo-Filgrastim  480 mcg Subcutaneous Weekly

## 2017-09-06 NOTE — Progress Notes (Signed)
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 She underwent a prolonged procedure 04/01/2017 with colostomy takedown, ileocecal resection with coloanal anastomosis and diverting loop ileostomy, 5 hours of LOA, and right ureteral stent exchange with psoas bladder hitch. Since that time she reported significant ileostomy output and was felt not to be keeping up with per oral intake. Had ureteral stent removed Jan 2019, creatinine noted to be up to 3 from baseline of 1. Had been given IVF's as outpatient, but labs showed acute renal failure with creatinine 4.28, so patient admitted by surgery on 1/28. Nephrology, urology, and hospitalists consulted. CT showed new colovesical fistula.   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 During hospitalization, CBGs have been substantially elevated. Despite 80 units of insulin in this patient not previously on insulin, CBG at lunch is 410mg /dl.  - Continue lantus 25u BID, increase mealtime novolog 5u > 7u, continue resistant SSI, HS correction. - Home -gliptin formulary alternative added today. - Diabetes coordinator following. - Holding metformin  Acute renal failure on stage II CKD: Suspected prerenal etiology (both decreased po intake and high output ileostomy) without imaging evidence of urinary obstruction. As Dr. Tresa Moore points out, BUN has been elevated since ileostomy.  - Nephrology consulted - Trend BMP, UOP.   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 s/p bilateral nephrostomy tubes 2/1 and foley placed for diversion.  - Optimize nutritional status/glycemic control  - Per surgery and urology.  Recurrent endometrial CA s/p resection, adjuvant radiation, partial vaginectomy, LAR/colostomy April 2016. No chemotherapy due to idiopathic neutropenia. History of breast CA s/p bilateral mastectomies: Stage IIA, ER/PR positive. Followed by Dr. Denman [name].  - Continue arimidex   Chronic idiopathic neutropenia: Improved. Followed by Dr. Alvy Bimler - Monitor CBC, continued GCSF stim.   Iron deficiency anemia: - Hgb stable since 1u PRBCs 1/31. Continue monitoring.  High output ileostomy:  - WOC consulted  Polymicrobial UTI: >100k Serratia marcescens, >80k GBS on UCx, - Continue ciprofloxacin (PCN allergy)   Labile affect, adjustment disorder, possible depression: No current SI/HI.  - Zoloft started at admission, but was never released, therefore pt has not been getting this. I released this order.   - Would benefit from counseling as outpatient. Reports having significant social supports in place.  Moderate protein-calorie malnutrition: Suspect adding protein would benefit patient  even with renal failure, but defer to  nephrology if protein should be restricted.  - RD consulted, supplement as pt can tolerate.   Hypokalemia:  - Replacement per nephrology  Hypomagnesemia:  - Replace today.  Hypothyroidism: TSH 2.408 - Continue stable dose synthroid.   Hyperlipidemia:  - Continue statin   DVT prophylaxis: Lovenox 30mg  Code Status: Full Family Communication: None at bedside Disposition Plan: SNF, per primary team.   Consultants:   Hospitalists  Nephrology  Urology  Procedures:   None  Antimicrobials:  Ciprofloxacin 1/29 - 2/4  Subjective: Pain better controlled today. Appears more motivated to improve nutrition and functional status, amenable to SNF. Major concern today is ongoing urine leaking from rectum during ambulation.   Objective: Vitals:   09/06/17 0443 09/06/17 0540 09/06/17 0920 09/06/17 1500  BP:  (!) 114/47 (!) 110/56 (!) 132/58  Pulse:  98 98 81  Resp:  18 18 18   Temp:  98.9 F (37.2 C) 99.9 F (37.7 C) 98.6 F (37 C)  TempSrc:  Oral Oral Oral  SpO2:  100% 98% 100%  Weight: 91.3 kg (201 lb 4.5 oz)     Height:        Intake/Output Summary (Last 24 hours) at 09/06/2017 1627 Last data filed at 09/06/2017 1445 Gross per 24 hour  Intake 2350 ml  Output 4881 ml  Net -2531 ml   Filed Weights   09/04/17 0500 09/05/17 0500 09/06/17 0443  Weight: 94.1 kg (207 lb 7.3 oz) 94.1 kg (207 lb 7.3 oz) 91.3 kg (201 lb 4.5 oz)   Gen: Chronically ill-appearing 62 y.o. female in evident pain tearful in bedside chair. Pulm: Non-labored, clear to auscultation bilaterally.  CV: Regular rate/rhythm. No murmur, rub, or gallop. No JVD, no pedal edema. GI: Abdomen soft, mild tenderness diffusely without rebound or guarding, non-distended, with normoactive bowel sounds. No organomegaly or masses felt. Ext: Warm, no deformities Skin: Ileostomy site without tenderness, erythema, or discharge; DL PICC right brachial c/d/i. Alopecia noted. Neuro: Alert and oriented. No focal neurological  deficits. Psych: Judgement and insight appear normal. Mood labile with congruent, broad affect.   CBC: Recent Labs  Lab 09/01/17 0406 09/02/17 0334 09/03/17 0455 09/04/17 0416 09/05/17 0433  WBC 4.3 5.7 4.4 13.6* 5.6  NEUTROABS 3.1  --  3.3 11.8* 4.2  HGB 8.4* 8.1* 8.2* 8.6* 8.2*  HCT 24.5* 23.3* 24.9* 26.7* 25.0*  MCV 85.4 86.0 88.9 90.5 89.3  PLT 200 184 221 268 390   Basic Metabolic Panel: Recent Labs  Lab 09/02/17 0334 09/03/17 0455 09/04/17 0416 09/05/17 0433 09/06/17 0348  NA 125* 132* 132* 132* 131*  K 3.3* 3.6 3.7 3.4* 3.7  CL 94* 99* 101 101 101  CO2 22 23 23 22 22   GLUCOSE 357* 187* 209* 239* 156*  BUN 59* 56* 55* 52* 48*  CREATININE 3.61* 3.59* 3.61* 3.00* 3.23*  CALCIUM 7.6* 8.5* 8.4* 8.5* 8.4*  MG 1.5* 1.9 1.3* 2.0 1.4*  PHOS 2.5 3.8 3.8 3.4 3.5   Liver Function Tests: Recent Labs  Lab 09/02/17 0334 09/03/17 0455 09/04/17 0416 09/05/17 0433 09/06/17 0348  ALBUMIN 1.4* 1.9* 1.8* 1.8* 1.6*   HbA1C: No results for input(s): HGBA1C in the last 72 hours. CBG: Recent Labs  Lab 09/05/17 1149 09/05/17 1729 09/05/17 2145 09/06/17 0721 09/06/17 1159  GLUCAP 410* 295* 248* 132* 259*   Thyroid Function Tests: No results for input(s): TSH, T4TOTAL, FREET4, T3FREE, THYROIDAB in the last 72 hours. Anemia Panel: No results for input(s): VITAMINB12,  FOLATE, FERRITIN, TIBC, IRON, RETICCTPCT in the last 72 hours. Urine analysis:    Component Value Date/Time   COLORURINE YELLOW 08/30/2017 1240   APPEARANCEUR CLOUDY (A) 08/30/2017 1240   LABSPEC 1.006 08/30/2017 1240   LABSPEC 1.015 11/17/2015 1436   PHURINE 5.0 08/30/2017 1240   GLUCOSEU NEGATIVE 08/30/2017 1240   GLUCOSEU 2,000 11/17/2015 1436   HGBUR MODERATE (A) 08/30/2017 1240   BILIRUBINUR NEGATIVE 08/30/2017 1240   BILIRUBINUR neg 08/30/2016 1025   BILIRUBINUR Negative 11/17/2015 1436   KETONESUR NEGATIVE 08/30/2017 1240   PROTEINUR 100 (A) 08/30/2017 1240   UROBILINOGEN negative  08/30/2016 1025   UROBILINOGEN 0.2 11/17/2015 1436   NITRITE NEGATIVE 08/30/2017 1240   LEUKOCYTESUR LARGE (A) 08/30/2017 1240   LEUKOCYTESUR Moderate 11/17/2015 1436   Recent Results (from the past 240 hour(s))  Culture, Urine     Status: Abnormal   Collection Time: 08/30/17 10:10 AM  Result Value Ref Range Status   Specimen Description URINE, RANDOM  Final   Special Requests NONE  Final   Culture (A)  Final    >=100,000 COLONIES/mL SERRATIA MARCESCENS 80,000 COLONIES/mL GROUP B STREP(S.AGALACTIAE)ISOLATED TESTING AGAINST S. AGALACTIAE NOT ROUTINELY PERFORMED DUE TO PREDICTABILITY OF AMP/PEN/VAN SUSCEPTIBILITY. Performed at [facility], [place] [address] [place], [place] [address]    Report Status 09/01/2017 FINAL  Final   Organism ID, Bacteria SERRATIA MARCESCENS (A)  Final      Susceptibility   Serratia marcescens - MIC*    CEFAZOLIN >=64 RESISTANT Resistant     CEFTRIAXONE <=1 SENSITIVE Sensitive     CIPROFLOXACIN 1 SENSITIVE Sensitive     GENTAMICIN <=1 SENSITIVE Sensitive     NITROFURANTOIN 256 RESISTANT Resistant     TRIMETH/SULFA <=20 SENSITIVE Sensitive     * >=100,000 COLONIES/mL SERRATIA MARCESCENS  Culture, blood (Routine X 2) w Reflex to ID Panel     Status: None   Collection Time: 08/30/17  3:26 PM  Result Value Ref Range Status   Specimen Description   Final    BLOOD LEFT HAND Performed at [facility], [name] [address], [place], [place] [address]    Special Requests   Final    IN PEDIATRIC BOTTLE Blood Culture adequate volume Performed at [place] [address], [place], [place] [address]    Culture   Final    NO GROWTH 5 DAYS Performed at [facility], [place] [address] [place], [place] [address]    Report Status 09/04/2017 FINAL  Final  Culture, blood (Routine X 2) w Reflex to ID Panel     Status: None   Collection Time: 08/30/17  3:30 PM  Result Value Ref Range Status   Specimen Description   Final    BLOOD LEFT  ANTECUBITAL Performed at [place] [address], [place], [place] [address]    Special Requests   Final    IN PEDIATRIC BOTTLE Blood Culture adequate volume Performed at [place] [address] [place], [place] [address]    Culture   Final    NO GROWTH 5 DAYS Performed at [facility], [place] [address], [place], [place] [address]    Report Status 09/04/2017 FINAL  Final  Culture, Urine     Status: Abnormal   Collection Time: 08/31/17  6:10 PM  Result Value Ref Range Status   Specimen Description   Final    URINE, CATHETERIZED Performed at [place]  [address], [place], [place] [address]    Special Requests   Final    NONE Performed at [facility], [place] [address] [place], [place] [address]    Culture >=100,000 COLONIES/mL SERRATIA MARCESCENS (A)  Final   Report Status 09/03/2017 FINAL  Final   Organism ID, Bacteria SERRATIA MARCESCENS (A)  Final      Susceptibility   Serratia marcescens - MIC*    CEFAZOLIN >=64 RESISTANT Resistant     CEFTRIAXONE <=1 SENSITIVE Sensitive     CIPROFLOXACIN 1 SENSITIVE Sensitive     GENTAMICIN <=1 SENSITIVE Sensitive     NITROFURANTOIN 256 RESISTANT Resistant     TRIMETH/SULFA <=20 SENSITIVE Sensitive     * >=100,000 COLONIES/mL SERRATIA MARCESCENS  Urine Culture     Status: Abnormal   Collection Time: 09/02/17  5:07 PM  Result Value Ref Range Status   Specimen Description   Final    URINE, RANDOM KIDNEY RIGHT Performed at [place] [address] [place], [place] [address]    Special Requests   Final    NONE Performed at [facility], [place] [address] [place], [place] [address]    Culture (A)  Final    40,000 COLONIES/mL SERRATIA MARCESCENS 20,000 COLONIES/mL GROUP B STREP(S.AGALACTIAE)ISOLATED TESTING AGAINST S. AGALACTIAE NOT ROUTINELY PERFORMED DUE TO PREDICTABILITY OF AMP/PEN/VAN  SUSCEPTIBILITY. >=100,000 COLONIES/mL LACTOBACILLUS SPECIES Standardized susceptibility testing for this organism is not available. 50,000 COLONIES/mL YEAST    Report Status 09/05/2017 FINAL  Final   Organism ID, Bacteria SERRATIA MARCESCENS (A)  Final      Susceptibility   Serratia marcescens - MIC*    CEFAZOLIN >=64 RESISTANT Resistant     CEFTRIAXONE <=1 SENSITIVE Sensitive     CIPROFLOXACIN 1 SENSITIVE Sensitive     GENTAMICIN <=1 SENSITIVE Sensitive     NITROFURANTOIN 256 RESISTANT Resistant     TRIMETH/SULFA <=20 SENSITIVE Sensitive     * 40,000 COLONIES/mL SERRATIA MARCESCENS  Urine Culture     Status: Abnormal   Collection Time: 09/02/17  5:07 PM  Result Value Ref Range Status   Specimen Description URINE, RANDOM  Final   Special Requests   Final    NONE Performed at [facility], [place] [address], [place], [place] [address]    Culture 80,000 COLONIES/mL SERRATIA MARCESCENS (A)  Final   Report Status 09/05/2017 FINAL  Final   Organism ID, Bacteria SERRATIA MARCESCENS (A)  Final      Susceptibility   Serratia marcescens - MIC*    CEFAZOLIN >=64 RESISTANT Resistant     CEFTRIAXONE <=1 SENSITIVE Sensitive     CIPROFLOXACIN 1 SENSITIVE Sensitive     GENTAMICIN <=1 SENSITIVE Sensitive     NITROFURANTOIN 256 RESISTANT Resistant     TRIMETH/SULFA <=20 SENSITIVE Sensitive     * 80,000 COLONIES/mL SERRATIA MARCESCENS      Radiology Studies: No results found.  Scheduled Meds: . acetaminophen  1,000 mg Oral TID  . anastrozole  1 mg Oral Daily  . artificial tears   Both Eyes QHS  . cholecalciferol  10,000 Units Oral Weekly  . enoxaparin (LOVENOX) injection  30 mg Subcutaneous Q24H  . famotidine  20 mg Oral Daily  . insulin aspart  0-20 Units Subcutaneous TID WC  . insulin aspart  0-5 Units Subcutaneous QHS  . insulin aspart  5 Units Subcutaneous TID WC  . insulin glargine  25 Units Subcutaneous BID  . levothyroxine  150 mcg Oral QAC breakfast  .  linagliptin  5 mg  Oral Daily  . lip balm  1 application Topical BID  . loratadine  10 mg Oral q morning - 10a  . metoCLOPramide  5 mg Oral TID AC  . polyvinyl alcohol  1 drop Both Eyes QHS  . pregabalin  75 mg Oral Daily  . prenatal multivitamin  1 tablet Oral Daily  . protein supplement shake  11 oz Oral BID BM  . psyllium  1 packet Oral Daily  . rosuvastatin  10 mg Oral QPM  . Tbo-Filgrastim  480 mcg Subcutaneous Weekly   Continuous Infusions: . sodium chloride    . methocarbamol (ROBAXIN)  IV       LOS: 8 days   Time spent: 35 minutes.  Vance Gather, MD [facility] [email or web address] Password [facility] 09/06/2017, 4:27 PM

## 2017-09-06 NOTE — NC FL2 (Signed)
[place] LEVEL OF CARE SCREENING TOOL     IDENTIFICATION  Patient Name: [name] Birthdate: [birth year] Sex: female Admission Date (Current Location): 08/29/2017  [facility] and [place] Number:  Herbalist and Address:  [facility],  [place] [address] [place]      Provider Number: [id number]  Attending Physician Name and Address:  Michael [name], MD  Relative Name and Phone Number:       Current Level of Care: Hospital Recommended Level of Care: [place] Prior Approval Number:    Date Approved/Denied:   PASRR Number:    Discharge Plan: SNF    Current Diagnoses: Patient Active Problem List   Diagnosis Date Noted  . Poorly controlled diabetes mellitus ([place]) 08/31/2017  . Pressure injury of skin 08/31/2017  . Colovesical fistula - delayed 08/31/2017  . History of external beam radiation therapy to pelvis 08/31/2017  . ARF (acute renal failure) ([name]) 08/30/2017  . Adjustment disorder with mixed anxiety and depressed mood 08/30/2017  . Dehydration 08/29/2017  . Chronic kidney disease (CKD), stage III (moderate) ([place]) 08/24/2017  . Wound of right buttock 08/10/2017  . 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[place]) 06/20/2017  . 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 Protein-calorie malnutrition, moderate ([place]) 04/18/2017  . Postoperative anemia 04/04/2017  . 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 Hot flashes related to aromatase inhibitor therapy 03/18/2017  . 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 Vitamin D insufficiency 08/30/2016  . Breast cancer of upper-inner quadrant of left female breast ([place])   . Pelvic pain in female 03/19/2015  . Chronic anemia 12/30/2014  . UTI (urinary tract infection)   . Pancytopenia, acquired ([place]) 11/26/2014  .  Morbid obesity ([place]) 11/20/2014  . 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 Abdominal pain 09/09/2014  . Postmenopausal bleeding 09/05/2014  . History of ovarian & endometrial cancer 07/17/2012  . 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[place]) 05/10/2012  . Chronic neutropenia ([place]) 12/14/2011  . Primary hypothyroidism 12/14/2011  . DM type 2 (diabetes mellitus, type 2) ([place]) 12/14/2011  . Benign essential HTN 12/14/2011    Orientation RESPIRATION BLADDER Height & Weight     Self, Time, Situation, Place  Normal Indwelling catheter Weight: 201 lb 4.5 oz (91.3 kg) Height:  5\' 2"  (157.5 cm)  BEHAVIORAL SYMPTOMS/MOOD NEUROLOGICAL BOWEL NUTRITION STATUS      Ileostomy Diet(See Discharge Summary )  AMBULATORY STATUS COMMUNICATION OF NEEDS Skin   Limited Assist Verbally PU Stage and Appropriate Care                       Personal Care Assistance Level of Assistance  Bathing, Feeding, Dressing Bathing Assistance: Maximum assistance Feeding assistance: Independent Dressing Assistance: Maximum assistance     Functional Limitations Info  Sight, Hearing, Speech Sight Info: Adequate Hearing Info: Adequate Speech Info: Adequate    SPECIAL CARE FACTORS FREQUENCY  PT (By licensed PT), OT (By licensed OT)     PT Frequency: 5x/week  OT Frequency: 5x/week             Contractures Contractures Info: Not present    Additional Factors Info  Code Status, Allergies, Psychotropic, Insulin Sliding Scale Code Status Info: Fullcode Allergies Info: Allergies: Penicillins, Ultram Tramadol, Adhesive Tape, Cefaclor, Erythromycin, Trimethoprim, Ciprofloxacin, Fluconazole, Oxycodone, Pectin, Sulfa Antibiotics  Insulin Sliding Scale Info: See Medicatin List        Current Medications (09/06/2017):  This is the current hospital active medication list Current Facility-Administered Medications  Medication Dose  Route Frequency Provider Last Rate Last Dose  . 0.9 %  sodium chloride infusion   Intravenous Once Patrecia Pour, MD      . acetaminophen (TYLENOL) tablet 1,000 mg  1,000 mg Oral TID Michael [name], MD   1,000 mg at 09/06/17 0941  . anastrozole (ARIMIDEX) tablet 1 mg  1 mg Oral Daily Michael [name], MD   1 mg at 09/06/17 0942  . artificial tears (LACRILUBE) ophthalmic ointment   Both Eyes Ardeen Fillers, MD      . cholecalciferol (VITAMIN D) tablet 10,000 Units  10,000 Units Oral Weekly Michael [name], MD   10,000 Units at 09/04/17 1650  . enoxaparin (LOVENOX) injection 30 mg  30 mg Subcutaneous Q24H Allred, Darrell K, PA-C   30 mg at 09/05/17 2200  . famotidine (PEPCID) tablet 20 mg  20 mg Oral Daily Michael [name], MD   20 mg at 09/05/17 1049  . guaiFENesin-dextromethorphan (ROBITUSSIN DM) 100-10 MG/5ML syrup 10 mL  10 mL Oral Q4H PRN Michael [name], MD   10 mL at 09/01/17 2135  . HYDROmorphone (DILAUDID) injection 0.5-1 mg  0.5-1 mg Intravenous Q3H PRN Roney Jaffe, MD   1 mg at 09/06/17 1008  . insulin aspart (novoLOG) injection 0-20 Units  0-20 Units Subcutaneous TID WC Patrecia Pour, MD   3 Units at 09/06/17 [phone]  . insulin aspart (novoLOG) injection 0-5 Units  0-5 Units Subcutaneous QHS Patrecia Pour, MD   2 Units at 09/05/17 2201  . insulin aspart (novoLOG) injection 5 Units  5 Units Subcutaneous TID WC Patrecia Pour, MD   5 Units at 09/06/17 [phone]  . insulin glargine (LANTUS) injection 25 Units  25 Units Subcutaneous BID Patrecia Pour, MD   25 Units at 09/06/17 [phone]  . levothyroxine (SYNTHROID, LEVOTHROID) tablet 150 mcg  150 mcg Oral QAC breakfast Michael [name], MD   150 mcg at 09/06/17 0941  . linagliptin (TRADJENTA) tablet 5 mg  5 mg Oral Daily Michael [name], MD   5 mg at 09/06/17 1007  . lip balm (CARMEX) ointment 1 application  1 application Topical BID Michael [name], MD   1 application at 67/20/94 2204  . loratadine (CLARITIN) tablet 10 mg  10 mg Oral q morning - 10a Michael [name], MD    10 mg at 09/06/17 1007  . magnesium sulfate IVPB 4 g 100 mL  4 g Intravenous Once Michael [name], MD 50 mL/hr at 09/06/17 1008 4 g at 09/06/17 1008  . methocarbamol (ROBAXIN) 1,000 mg in dextrose 5 % 50 mL IVPB  1,000 mg Intravenous Q6H PRN Michael [name], MD      . methocarbamol (ROBAXIN) tablet 1,000 mg  1,000 mg Oral Q6H PRN Michael [name], MD      . metoCLOPramide (REGLAN) tablet 5 mg  5 mg Oral TID Maebelle Munroe, MD   5 mg at 09/06/17 [phone]  . polyvinyl alcohol (LIQUIFILM TEARS) 1.4 % ophthalmic solution 1 drop  1 drop Both Eyes Ardeen Fillers, MD   1 drop at 09/05/17 2204  . pregabalin (LYRICA) capsule 75 mg  75 mg Oral Daily Michael [name], MD   75 mg at 09/06/17 1007  . prenatal multivitamin tablet 1 tablet  1 tablet Oral Daily Michael [name], MD   1 tablet at 09/06/17  [phone]  . prochlorperazine (COMPAZINE) injection 10 mg  10 mg Intravenous Q4H PRN Barton Dubois, MD   10 mg at 09/04/17 2225  . protein supplement (PREMIER PROTEIN) liquid  11 oz Oral BID BM Michael [name], MD   11 oz at 09/05/17 2004  . psyllium (HYDROCIL/METAMUCIL) packet 1 packet  1 packet Oral Daily Michael [name], MD   1 packet at 09/05/17 1049  . rosuvastatin (CRESTOR) tablet 10 mg  10 mg Oral QPM Michael [name], MD   10 mg at 09/05/17 1712  . sodium chloride flush (NS) 0.9 % injection 10-40 mL  10-40 mL Intracatheter PRN Michael [name], MD   20 mL at 09/06/17 0351  . Tbo-Filgrastim (GRANIX) injection 480 mcg  480 mcg Subcutaneous Weekly [name], MD   480 mcg at 09/03/17 1701     Discharge Medications: Please see discharge summary for a list of discharge medications.  Relevant Imaging Results:  Relevant Lab Results:   Additional Information ssn: [phone]  Lia Hopping, LCSW

## 2017-09-06 NOTE — Progress Notes (Signed)
PHARMACIST - PHYSICIAN ORDER COMMUNICATION  CONCERNING: P&T Medication Policy on Herbal Medications  DESCRIPTION:  This patient's order for:  Biotin  has been noted.  This product(s) is classified as an "herbal" or natural product. Due to a lack of definitive safety studies or FDA approval, nonstandard manufacturing practices, plus the potential risk of unknown drug-drug interactions while on inpatient medications, the Pharmacy and Therapeutics Committee does not permit the use of "herbal" or natural products of this type within [place].   ACTION TAKEN: The pharmacy department is unable to verify this order at this time and your patient has been informed of this safety policy. Please reevaluate patient's clinical condition at discharge and address if the herbal or natural product(s) should be resumed at that time.  Bradely Rudin, PharmD 

## 2017-09-06 NOTE — Progress Notes (Signed)
CSW consulted for SNF placement. CSW completed FL2,sent initial referral.   Deirdre Pippins, MSW Clinical Social Worker  [phone] 09/06/2017  11:01 AM

## 2017-09-07 LAB — RENAL FUNCTION PANEL
Albumin: 1.9 g/dL — ABNORMAL LOW (ref 3.5–5.0)
Anion gap: 8 (ref 5–15)
BUN: 47 mg/dL — ABNORMAL HIGH (ref 6–20)
CHLORIDE: 102 mmol/L (ref 101–111)
CO2: 21 mmol/L — AB (ref 22–32)
CREATININE: 3.23 mg/dL — AB (ref 0.44–1.00)
Calcium: 8.5 mg/dL — ABNORMAL LOW (ref 8.9–10.3)
GFR calc Af Amer: 16 mL/min — ABNORMAL LOW (ref >60)
GFR, EST NON AFRICAN AMERICAN: 14 mL/min — AB (ref >60)
Glucose, Bld: 114 mg/dL — ABNORMAL HIGH (ref 65–99)
POTASSIUM: 3.7 mmol/L (ref 3.5–5.1)
Phosphorus: 4.2 mg/dL (ref 2.5–4.6)
Sodium: 131 mmol/L — ABNORMAL LOW (ref 135–145)

## 2017-09-07 LAB — MAGNESIUM: MAGNESIUM: 2.1 mg/dL (ref 1.7–2.4)

## 2017-09-07 LAB — CBC
HEMATOCRIT: 26.1 % — AB (ref 36.0–46.0)
Hemoglobin: 8.2 g/dL — ABNORMAL LOW (ref 12.0–15.0)
MCH: 28.6 pg (ref 26.0–34.0)
MCHC: 31.4 g/dL (ref 30.0–36.0)
MCV: 90.9 fL (ref 78.0–100.0)
PLATELETS: 321 10*3/uL (ref 150–400)
RBC: 2.87 MIL/uL — ABNORMAL LOW (ref 3.87–5.11)
RDW: 15.3 % (ref 11.5–15.5)
WBC: 4.2 10*3/uL (ref 4.0–10.5)

## 2017-09-07 LAB — GLUCOSE, CAPILLARY
GLUCOSE-CAPILLARY: 92 mg/dL (ref 65–99)
GLUCOSE-CAPILLARY: 227 mg/dL — AB (ref 65–99)
GLUCOSE-CAPILLARY: 210 mg/dL — AB (ref 65–99)
GLUCOSE-CAPILLARY: 142 mg/dL — AB (ref 65–99)

## 2017-09-07 NOTE — Progress Notes (Signed)
PT Cancellation Note  Patient Details Name: [name] MRN: [id number] DOB: [birth year]   Cancelled Treatment:    Reason Eval/Treat Not Completed: Patient declined, wants to eat and get medication. Will check back later.    Claretha Cooper 09/07/2017, 8:50 AM Tresa Endo PT [phone]

## 2017-09-07 NOTE — Progress Notes (Signed)
Referring Physician(s): Manny,T  Supervising Physician: Jacqulynn Cadet  Patient Status:  [facility] - In-pt  Chief Complaint: Bladder leak   Subjective: Pt feeling about the same; still having some leakage of urine from rectum   Allergies: Penicillins; Ultram [tramadol]; Adhesive [tape]; Cefaclor; Erythromycin; Trimethoprim; Ciprofloxacin; Fluconazole; Oxycodone; Pectin; and Sulfa antibiotics  Medications: Prior to Admission medications   Medication Sig Start Date End Date Taking? Authorizing Provider  anastrozole (ARIMIDEX) 1 MG tablet TAKE 1 TABLET DAILY 12/08/16  Yes Gorsuch, Ni, MD  Biotin 5 MG TABS Take 5 mg by mouth every morning.    Yes [provider]  Cholecalciferol (VITAMIN D3) 10000 UNITS capsule Take 10,000 Units by mouth once a week. Sunday evening's   Yes [provider]  diphenoxylate-atropine (LOMOTIL) 2.5-0.025 MG tablet Take 1-2 tablets by mouth every 8 (eight) hours. TO PREVENT LOOSE BOWEL MOVEMENTS 05/20/17  Yes [provider]  Famotidine-Ca Carb-Mag Hydrox (PEPCID COMPLETE PO) Take 1 tablet by mouth daily as needed (INDIGESTION).   Yes [provider]  filgrastim (NEUPOGEN) 480 MCG/1.6ML injection Inject 1.6 ml under the skin every 5 days for life Patient taking differently: Inject 480 mcg into the skin See admin instructions. Inject 1.6 ml under the skin every 6 days for life 01/06/17  Yes Gorsuch, Ni, MD  levothyroxine (SYNTHROID, LEVOTHROID) 150 MCG tablet Take 150 mcg by mouth daily before breakfast.    Yes [provider]  loratadine (CLARITIN) 10 MG tablet Take 10 mg by mouth every morning.    Yes [provider]  omega-3 acid ethyl esters (LOVAZA) 1 G capsule Take 1 g by mouth 2 (two) times daily.    Yes [provider]  ondansetron (ZOFRAN-ODT) 4 MG disintegrating tablet Take 4 mg by mouth every 6 (six) hours as needed for nausea or vomiting.  04/12/17  Yes [provider]  Polyethyl  Glycol-Propyl Glycol (SYSTANE OP) Place 1 drop into both eyes at bedtime.    Yes [provider]  Prenatal Vit-Fe Fumarate-FA (PRENATAL VITAMIN PO) Take 1 capsule by mouth daily. Takes prenatal because there are no dyes in it   Yes [provider]  prochlorperazine (COMPAZINE) 10 MG tablet Take 1 tablet (10 mg total) by mouth 2 (two) times daily as needed for nausea or vomiting. Patient taking differently: Take 10 mg by mouth 2 (two) times daily as needed for nausea or vomiting.  05/28/17  Yes Virgel Manifold, MD  promethazine (PHENERGAN) 25 MG tablet Take 1 tablet (25 mg total) by mouth every 6 (six) hours as needed for nausea or vomiting. 05/28/17  Yes Virgel Manifold, MD  ranitidine (ZANTAC) 150 MG tablet Take 150 mg by mouth 2 (two) times daily as needed for heartburn.    Yes [provider]  rosuvastatin (CRESTOR) 10 MG tablet Take 10 mg by mouth every evening.    Yes [provider]  sitaGLIPtin (JANUVIA) 100 MG tablet Take 100 mg by mouth every morning.    Yes [provider]  HYDROcodone-acetaminophen (NORCO/VICODIN) 5-325 MG tablet Take 1 tablet by mouth every 6 (six) hours as needed (Pain). Patient not taking: Reported on 08/29/2017 08/10/17   Shelda Pal, DO     Vital Signs: BP 131/72 (BP Location: Right Leg)   Pulse 94   Temp 98.6 F (37 C) (Oral)   Resp 18   Ht 5' 2"  (1.575 m)   Wt 201 lb 4.5 oz (91.3 kg)   SpO2 100%   BMI 36.81 kg/m  Physical Exam bilat PCN's intact, insertion sites ok, mildly tender, more so on left; outputs L- 1.5 liters; R- 1.3 liters light yellow urine  Imaging: No results found.  Labs:  CBC: Recent Labs    09/03/17 0455 09/04/17 0416 09/05/17 0433 09/07/17 0324  WBC 4.4 13.6* 5.6 4.2  HGB 8.2* 8.6* 8.2* 8.2*  HCT 24.9* 26.7* 25.0* 26.1*  PLT 221 268 272 321    COAGS: Recent Labs    04/16/17 0047 09/02/17 0939  INR 1.21 1.21    BMP: Recent Labs    09/04/17 0416  09/05/17 0433 09/06/17 0348 09/07/17 0324  NA 132* 132* 131* 131*  K 3.7 3.4* 3.7 3.7  CL 101 101 101 102  CO2 23 22 22  21*  GLUCOSE 209* 239* 156* 114*  BUN 55* 52* 48* 47*  CALCIUM 8.4* 8.5* 8.4* 8.5*  CREATININE 3.61* 3.00* 3.23* 3.23*  GFRNONAA 12* 16* 14* 14*  GFRAA 14* 18* 16* 16*    LIVER FUNCTION TESTS: Recent Labs    04/20/17 0510 04/21/17 0416 05/27/17 1724 08/29/17 1550  09/04/17 0416 09/05/17 0433 09/06/17 0348 09/07/17 0324  BILITOT 0.4 0.5 0.3 0.5  --   --   --   --   --   AST 13* 13* 24 16  --   --   --   --   --   ALT 10* 10* 33 24  --   --   --   --   --   ALKPHOS 177* 190* 166* 173*  --   --   --   --   --   PROT 5.8* 5.9* 9.2* 6.8  --   --   --   --   --   ALBUMIN 1.8* 1.8* 3.9 2.1*   < > 1.8* 1.8* 1.6* 1.9*   < > = values in this interval not displayed.    Assessment and Plan: Pt with hx met endom ca, post op bladder injury/ leak (rectal bladder fistula), s/p diverting  bilat PCN's 2/1- good output; afebrile; WBC nl; creat 3.23 (3.23);  maintain PCN's for now as per urology; will need q 6 week exchanges otherwise; will see prn       Electronically Signed: D. Rowe Robert, PA-C 09/07/2017, 10:00 AM   I spent a total of 15 minutes at the the patient's bedside AND on the patient's hospital floor or unit, greater than 50% of which was counseling/coordinating care for bilateral nephrostomies    Patient ID: [name], female   DOB: 1956-06-19, 62 y.o.   MRN: [id number]

## 2017-09-07 NOTE — Progress Notes (Signed)
CSW updated patient spouse about discharge. Patient spouse is not agreeable to [employer] at [facility].  CSW will [place] in the morning to cancel authorization.  CSW informed patient spouse that a private room at any facility is not a guarantee. CSW will revisit list in the a.m with patient.   Kathrin Greathouse, Latanya Presser, MSW Clinical Social Worker  [phone] 09/07/2017  5:51 PM

## 2017-09-07 NOTE — Consult Note (Signed)
[place] Nurse ostomy follow up Stoma type/location: RLQ ileostomy Stomal assessment/size: 1 and 1/8 inch Peristomal assessment: peristomal irritant dermatitis (chronic), but improved over last visit. Treatment options for stomal/peristomal skin: Complex pouching system (see procedure in orders and also in patient's room). Output: brown/green stool Ostomy pouching:See orders and sign in patient's room.  Education provided: None.  Patient's husband assisting and instructing on the pouching process that works best for [employer]. Enrolled patient in [place] Start Discharge program: Yes  [place] nursing team will follow, and will remain available to this patient, the nursing, surgical and medical teams.   Thanks, Maudie Flakes, MSN, RN, [place], Arther Abbott  Pager# [phone]

## 2017-09-07 NOTE — Progress Notes (Signed)
[facility] Rounding Note  Subjective/Interval History:  Reportedly she fell today earlier.  Pt is drowsy now.   Objective Vital signs in last 24 hours: Vitals:   09/06/17 1500 09/06/17 2240 09/07/17 0530 09/07/17 1315  BP: (!) 132/58 139/64 131/72 (!) 122/49  Pulse: 81 84 94 79  Resp: 18 18 18 17   Temp: 98.6 F (37 C) 98.2 F (36.8 C) 98.6 F (37 C) 98.8 F (37.1 C)  TempSrc: Oral Oral Oral Oral  SpO2: 100% 100% 100%   Weight:      Height:       Weight change:   Intake/Output Summary (Last 24 hours) at 09/07/2017 1605 Last data filed at 09/07/2017 1500 Gross per 24 hour  Intake 3045 ml  Output 3600 ml  Net -555 ml   Physical examination BP (!) 122/49 (BP Location: Right Arm)   Pulse 79   Temp 98.8 F (37.1 C) (Oral)   Resp 17   Ht 5\' 2"  (1.575 m)   Wt 91.3 kg (201 lb 4.5 oz)   SpO2 100%   BMI 36.81 kg/m   Pale WF BP stable Alert, looks better No jvd Chest clear bilat RRR no mrg Abd with ileostomy bag that has leaked overnight Remains mildly tender in RLQ, +BS Now only trace LE edema PICC R arm  Assessment/Recommendations: 1. Acute kidney injury - On admission 2/2 inadequate intake in face of high output ileostomy. Some degree of hydro chronically. New urine leak identified with rectovesical fistula. Renal failure present since at least 08/17/17 with SCr 3 when ureteral stent removed (baseline 1.0 in November). Possible  ATN from prolonged volume depletion; can't exclude effects of mechanical obstruction long term.  pt underwent bilateral perc tubes, good bilat UOP.  Creat stabilizing in 3- 3.5 range. 2. Hypokalemia -Stable post repletion 3. Hypomagnesemia - Repleted 2/1 4. Anemia - Transfused 1 U 1/31. Fe stores VERY low but no IV Fe while on IV ATB's 5. Polymicrobial UTI - serratia and Group B strep. IV Cipro. BC's neg. Fevers better 6. Chronic neutropenia - back on Rx  7. Depression/anxiety - per [facility] team   Kelly Splinter MD [place] pgr [phone]   09/06/2017, 5:43 PM  Recent Labs  Lab 09/01/17 0406 09/02/17 0334 09/03/17 0455 09/04/17 0416 09/05/17 0433 09/06/17 0348 09/07/17 0324  NA 126* 125* 132* 132* 132* 131* 131*  K 3.0* 3.3* 3.6 3.7 3.4* 3.7 3.7  CL 98* 94* 99* 101 101 101 102  CO2 19* 22 23 23 22 22  21*  GLUCOSE 306* 357* 187* 209* 239* 156* 114*  BUN 61* 59* 56* 55* 52* 48* 47*  CREATININE 3.46* 3.61* 3.59* 3.61* 3.00* 3.23* 3.23*  CALCIUM 7.6* 7.6* 8.5* 8.4* 8.5* 8.4* 8.5*  PHOS 2.5 2.5 3.8 3.8 3.4 3.5 4.2   Recent Labs  Lab 09/05/17 0433 09/06/17 0348 09/07/17 0324  ALBUMIN 1.8* 1.6* 1.9*    Recent Labs  Lab 09/01/17 0406  09/03/17 0455 09/04/17 0416 09/05/17 0433 09/07/17 0324  WBC 4.3   < > 4.4 13.6* 5.6 4.2  NEUTROABS 3.1  --  3.3 11.8* 4.2  --   HGB 8.4*   < > 8.2* 8.6* 8.2* 8.2*  HCT 24.5*   < > 24.9* 26.7* 25.0* 26.1*  MCV 85.4   < > 88.9 90.5 89.3 90.9  PLT 200   < > 221 268 272 321   < > = values in this interval not displayed.    Recent Labs  Lab  09/06/17 1159 09/06/17 1719 09/06/17 2156 09/07/17 0739 09/07/17 1351  GLUCAP 259* 310* 204* 92 227*   09/03/17 Mg 1.9 09/04/17 Mg 1.3  Studies/Results: No results found. Medications: . methocarbamol (ROBAXIN)  IV     . acetaminophen  1,000 mg Oral TID  . anastrozole  1 mg Oral Daily  . artificial tears   Both Eyes QHS  . cholecalciferol  10,000 Units Oral Weekly  . enoxaparin (LOVENOX) injection  30 mg Subcutaneous Q24H  . famotidine  20 mg Oral Daily  . insulin aspart  0-20 Units Subcutaneous TID WC  . insulin aspart  0-5 Units Subcutaneous QHS  . insulin aspart  7 Units Subcutaneous TID WC  . insulin glargine  25 Units Subcutaneous BID  . levothyroxine  150 mcg Oral QAC breakfast  . linagliptin  5 mg Oral Daily  . lip balm  1 application Topical BID  . loratadine  10 mg Oral q morning - 10a  . metoCLOPramide  5 mg Oral TID AC  . polyvinyl alcohol  1 drop Both Eyes QHS  . pregabalin  75 mg Oral Daily  . prenatal  multivitamin  1 tablet Oral Daily  . protein supplement shake  11 oz Oral BID BM  . psyllium  1 packet Oral Daily  . rosuvastatin  10 mg Oral QPM  . sertraline  50 mg Oral Daily  . Tbo-Filgrastim  480 mcg Subcutaneous Weekly

## 2017-09-07 NOTE — Progress Notes (Addendum)
[address] [place], [place] [address] Phone: [phone]  FAX: [phone]      [name] [id number] 04/18/1956  CARE TEAM:  PCP: Ann Held, DO  Outpatient Care Team: Patient Care Team: Carollee Herter, Alferd Apa, DO as PCP - General (Family Medicine) Heath Lark, MD as Consulting Physician (Hematology and Oncology) Everitt Amber, MD as Consulting Physician (Obstetrics and Gynecology) Michael [name], MD as Consulting Physician (General Surgery) Alexis Frock, MD as Consulting Physician (Urology) Altheimer, Legrand Como, MD as Consulting Physician (Endocrinology) Katy Apo, MD as Consulting Physician (Ophthalmology) Irene Limbo, MD as Consulting Physician (Plastic Surgery) Madelon Lips, MD as Consulting Physician (Nephrology)  Inpatient Treatment Team: Treatment Team: Attending Provider: Cristy Folks, MD; Consulting Physician: Alexis Frock, MD; Registered Nurse: Illene Regulus, RN; Technician: Reuel Derby, NT; Consulting Physician: [name], MD; Consulting Physician: Fatima Blank, MD; Consulting Physician: Michael [name], MD; Technician: Leda Quail, NT; Consulting Physician: Patrecia Pour, MD; Consulting Physician: Heath Lark, MD; Technician: Estrellita Ludwig, [place]; Rounding Team: Dorthy Cooler Radiology, MD; Consulting Physician: Edison Pace, Md, MD; Consulting Physician: Roney Jaffe, MD; Registered Nurse: Suzzanne Cloud, RN; Case Manager: Guadalupe Maple, RN; Physical Therapy Assistant: Diego Cory, PTA   Problem List:   Principal Problem:   Colovesical fistula - delayed Active Problems:   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 s/p resection/partial vaginectomy/ LAR/colostomy 11/19/2014   Ureteral stricture, right, s/p resection/reimplantation into bladder Beatris Si with Psoas Hitch) 04/01/2017   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   Primary  hypothyroidism   DM type 2 (diabetes mellitus, type 2) ([place])   History of ovarian & endometrial cancer   Morbid obesity ([place])   Pelvic cancer s/p colostomy takedown/loop ileostomy diversion 04/01/2017   Protein-calorie malnutrition, moderate ([place])   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[place])   Chronic kidney disease (CKD), stage III (moderate) ([facility])   Dehydration   ARF (acute renal failure) ([place])   Adjustment disorder with mixed anxiety and depressed mood   Poorly controlled diabetes mellitus ([place])   Pressure injury of skin   History of external beam radiation therapy to pelvis  SURGERY 04/01/2017     POST-OPERATIVE DIAGNOSIS:   Recurrent endometrial cancer status post low anterior resection and partial pelvic exeneration  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   SURGEON:Peng Thorstenson Gwynneth Aliment, MD- Primary (Dr Tresa Moore assist) PROCEDURE:  XI ROBOTIC LYSIS OF ADHESIONS x 5 hours XI ROBOTIC COLOSTOMY TAKEDOWN ROBOTIC SEWN COLOANAL ANASTOMOSIS Ileocecal resection with anastomosis Diverting loop ileostomy  SURGEON: Alexis Frock, MD - Primary (Dr Johney [place] assist) PROCEDURE:  Cystoscopy with R retrograde and stent exchange.  Robotic R ureteral reimplant with psoas bladder hitch.  Consults: urology   SURGERY 08/17/2017  DIAGNOSIS:  Right ureteral stricture, status post complex reconstruction.  PROCEDURES: 1. Cystoscopy with right retrograde pyelogram. 2. Removal of right ureteral stent.  PHYSICIAN:  Alexis Frock, MD        SPECIMENS:  Right ureteral stone for discard, inspected and intact.  FINDINGS: 1. Fusiform bladder shape consistent with prior psoas hitch. 2. Visibly patent ureteral-bladder anastomosis. 3. No evidence of hydronephrosis or narrowing with right retrograde     pyelogram.    Assessment  Improved  Delayed bladder leak turned into colovesical fistula with preferential urinary drainage in decompressed diverted colon limb.  Plan:    Discussed with urology.  Dr. Tresa Moore.  Discussed with nephrology, Dr. Lorrene Reid.  S/p percutaneous nephrostomy tubes to divert urine directly out of  kidneys and out of the bladder that is draining into her neorectum/colon.  Still needing Foley catheter.  Still leaking urine output anus.  Somewhat improved.  Will stop Antibiotics per urology.  Follow off Cipro  Lyrica for chronic pain and depression.  Zoloft somehow discontinued.  Not by me.  Improve diabetic control.  Diabetes still poorly controlled.  [facility] consultation.  Agree with starting Lantus.  I increased it to 20 twice daily to try and more aggressively correct things.  I believe it is up to 25 units now.  I restarted  Januvia if OK w [facility] Until her glucose is less than 150, she will never rally.  Hopefully of diabetes under better control, can transition to skilled facility with rehab  Solid diet.  Bowel regimen.  Seems to be thickening up.    Ostomy care  Orthostatic vital signs.  No evidence of any dehydration nor active bleeding  -Hemoglobin low but stable in pancytopenic pt  -Continue neupogen injections for pancytopenia  -VTE prophylaxis: SCDs, etc  -Ambulate as tolerated to help recovery    Perhaps it can close down this way.  As mentioned before, I am highly skeptical that given her radiated bladder that had been aggressively treated with internal/external drainage and stenting and still developed a problem.  Most likely this is a leak on the left lateral inferiolateral corner of the cystostomy Heineke-Mikulicz and psoas hitch needed for the bladder to reach the right ureter at that had the chronic severe distal stricture.  I am highly skeptical that a radiated bladder with a delayed leak will spontaneously heal.  She had internal Foley drainage, external surgical drainage, internal stenting drainage for a few months, yet this happened.   Therefore, most likely will require surgical repair.  Standard of care  usually on this situation is to resect colon & do a new distal anastomosis. Primarily repair the bladder & patch it.  However, there is no way I can redo anastomosis in a radiated pelvis with prior surgeries with prior anterior exoneration, radiation, 7-hour lysis adhesions and prior surgery when she was in better shape.  Only options I can see is to try & primarily patch the colon with OMENTOPEXY, primarily repair the bladder with OMENTOPEXY, and hope it will heal.  I am skeptical that it will.  Most likely we will have to convert to an end colostomy to get the colon away from it and allow the bladder to be patched.  I do agree with Dr. Tresa Moore that she is rather malnourished and deconditioned at this point so would like to try and hold off on definitive repair if we can.  I do not think a permanent loop ileostomy is going to be a good solution for her as she cannot tolerate it well.  We will see.  She actually talked about converting to a colostomy.  She would really like it to be infraumbilical so she can hide it in her pants.  I have found trying to put colostomies through panniculis fails pretty badly.  Also risks increased parastomal hernia or risk with doing that.  I suspect that her bladder is probably not salvageable and she may need a completion cystectomy with ileal conduit.  I will defer to Dr. Zettie Pho expertise with urology.  We will discuss that later once she is in better shape to actually tolerate a reoperation should it come to that (very likely).    Patient and husband still hoping that she could be my  patient.  I noted I will keep fighting with her despite her complicated medical and surgical history giving her more problems.  She is still quite emotionally devastated and exhausted.  She still gets tearful and nearly starts crying but is a little less histrionic today.  I did note that it is reasonable to get a second opinion.  She short of brought this up on her own.  I think she is hoping for  solution that avoids any bags.  I told her that is highly unlikely.  I tried to warn her she may end up with both a urostomy bag and a colostomy bag to eventually get through this if the bladder refuses to heal.  We will see.  They expressed understanding and appreciation.  Hopefully we can get her to a better situation.    Home health nursing seems to have failed.  Lack of outpatient ostomy clinic in town here making outpatient management challenging.   I strongly suspect she will need a SNF.  Patient more motivated to get up and move around.  She is still embarrassed in frustrated with persistent urine leak out anus.  But she is getting up some.  Work to try and transition to outpatient rehab with SNF later in the week      40 minutes spent in review, evaluation, examination, counseling, and coordination of care.  More than 50% of that time was spent in counseling.  [name], M.D., F.A.C.S. Gastrointestinal and Minimally Invasive Surgery [facility] [address], [place], [place] [address] [phone] Main / Paging    09/07/2017    Subjective: (Chief complaint)  Up in chair at least 12 hours.  Walked in hallways once but still some urine rectal drainage.  This is the most frustrating thing for her.  Still needing Foley & both perc nephrostomy tubes. Eating well WOCN at bedside Husband at bedside.   She felt lightheaded - fell in bathroom this AM.  Was well & walking the hallways well last night Complaining of pain at the nephrostomy tube sites.  Objective:  Vital signs:  Vitals:   09/06/17 0920 09/06/17 1500 09/06/17 2240 09/07/17 0530  BP: (!) 110/56 (!) 132/58 139/64 131/72  Pulse: 98 81 84 94  Resp: _0 Temp: 99.9 F (37.7 C) 98.6 F (37 C) 98.2 F (36.8 C) 98.6 F (37 C)  TempSrc: Oral Oral Oral Oral  SpO2: 98% 100% 100% 100%  Weight:      Height:        Last BM Date: 09/04/17  Intake/Output    Yesterday:  02/05 0701 - 02/06 0700 In: 1601 [P.O.:3150; I.V.:110] Out: 4330 [Urine:2850; Stool:1480] This shift:  Total I/O In: 240 [P.O.:240] Out: -   Bowel function:  Flatus: YES   BM:  YES.  Thick oatmeal consistency succus in ileostomy bag.  Drain: No drains.     Physical Exam:  General: Pt awake/alert/oriented x4 in no acute distress Eyes: PERRL, normal EOM.  Sclera clear.  No icterus Neuro: CN II-XII intact w/o focal sensory/motor deficits. Lymph: No head/neck/groin lymphadenopathy Psych:  No delerium/psychosis/paranoia.  Occasionally tearful.  More consolable than last week though.   HENT: Normocephalic, Mucus membranes moist.  No thrush Neck: Supple, No tracheal deviation Chest: No chest wall pain w good excursion CV:  Pulses intact.  Regular rhythm MS: Normal AROM mjr joints.  No obvious deformity.  Bilateral perc nephrostomy bags  Abdomen: Soft.  Nondistended.  No tenderness to palpation.  No evidence of peritonitis.  No incarcerated hernias.  No wounds.  Ileostomy pink with gas and succus in bag.      GU.  No purulent drainage or hematoma.  Foley in place with clear light yellow urine  Rectal deferred.  Last week had stricturing at coloanal anastomosis.  Had been finger dilated.  Held off on that today. Ext:   No deformity.  No mjr edema.  No cyanosis Skin: No petechiae / purpura  Results:   Labs: Results for orders placed or performed during the hospital encounter of 08/29/17 (from the past 48 hour(s))  Glucose, capillary     Status: Abnormal   Collection Time: 09/05/17  5:29 PM  Result Value Ref Range   Glucose-Capillary 295 (H) 65 - 99 mg/dL  Glucose, capillary     Status: Abnormal   Collection Time: 09/05/17  9:45 PM  Result Value Ref Range   Glucose-Capillary 248 (H) 65 - 99 mg/dL  Magnesium     Status: Abnormal   Collection Time: 09/06/17  3:48 AM  Result Value Ref Range   Magnesium 1.4 (L) 1.7 - 2.4 mg/dL    Comment: Performed at [facility], [place] [address] [place], [place] [address]  Renal function panel     Status: Abnormal   Collection Time: 09/06/17  3:48 AM  Result Value Ref Range   Sodium 131 (L) 135 - 145 mmol/L   Potassium 3.7 3.5 - 5.1 mmol/L   Chloride 101 101 - 111 mmol/L   CO2 22 22 - 32 mmol/L   Glucose, Bld 156 (H) 65 - 99 mg/dL   BUN 48 (H) 6 - 20 mg/dL   Creatinine, Ser 3.23 (H) 0.44 - 1.00 mg/dL   Calcium 8.4 (L) 8.9 - 10.3 mg/dL   Phosphorus 3.5 2.5 - 4.6 mg/dL   Albumin 1.6 (L) 3.5 - 5.0 g/dL   GFR calc non Af Amer 14 (L) >60 mL/min   GFR calc Af Amer 16 (L) >60 mL/min    Comment: (NOTE) The eGFR has been calculated using the CKD EPI equation. This calculation has not been validated in all clinical situations. eGFR's persistently <60 mL/min signify possible Chronic Kidney Disease.    Anion gap 8 5 - 15    Comment: Performed at [facility], [place] [address] [place], [place] [address]  Glucose, capillary     Status: Abnormal   Collection Time: 09/06/17  7:21 AM  Result Value Ref Range   Glucose-Capillary 132 (H) 65 - 99 mg/dL  Glucose, capillary     Status: Abnormal   Collection Time: 09/06/17 11:59 AM  Result Value Ref Range   Glucose-Capillary 259 (H) 65 - 99 mg/dL  Glucose, capillary     Status: Abnormal   Collection Time: 09/06/17  5:19 PM  Result Value Ref Range   Glucose-Capillary 310 (H) 65 - 99 mg/dL  Glucose, capillary     Status: Abnormal   Collection Time: 09/06/17  9:56 PM  Result Value Ref Range   Glucose-Capillary 204 (H) 65 - 99 mg/dL  Magnesium     Status: None   Collection Time: 09/07/17  3:24 AM  Result Value Ref Range   Magnesium 2.1 1.7 - 2.4 mg/dL    Comment: Performed at [facility], [place] [address], [place], [place] [address]  Renal function panel     Status: Abnormal   Collection Time: 09/07/17  3:24 AM  Result Value Ref  Range   Sodium 131 (L) 135 - 145 mmol/L   Potassium 3.7 3.5 - 5.1 mmol/L   Chloride  102 101 - 111 mmol/L   CO2 21 (L) 22 - 32 mmol/L   Glucose, Bld 114 (H) 65 - 99 mg/dL   BUN 47 (H) 6 - 20 mg/dL   Creatinine, Ser 3.23 (H) 0.44 - 1.00 mg/dL   Calcium 8.5 (L) 8.9 - 10.3 mg/dL   Phosphorus 4.2 2.5 - 4.6 mg/dL   Albumin 1.9 (L) 3.5 - 5.0 g/dL   GFR calc non Af Amer 14 (L) >60 mL/min   GFR calc Af Amer 16 (L) >60 mL/min    Comment: (NOTE) The eGFR has been calculated using the CKD EPI equation. This calculation has not been validated in all clinical situations. eGFR's persistently <60 mL/min signify possible Chronic Kidney Disease.    Anion gap 8 5 - 15    Comment: Performed at [facility], [place] [address], [place], [place] [address]  CBC     Status: Abnormal   Collection Time: 09/07/17  3:24 AM  Result Value Ref Range   WBC 4.2 4.0 - 10.5 K/uL   RBC 2.87 (L) 3.87 - 5.11 MIL/uL   Hemoglobin 8.2 (L) 12.0 - 15.0 g/dL   HCT 26.1 (L) 36.0 - 46.0 %   MCV 90.9 78.0 - 100.0 fL   MCH 28.6 26.0 - 34.0 pg   MCHC 31.4 30.0 - 36.0 g/dL   RDW 15.3 11.5 - 15.5 %   Platelets 321 150 - 400 K/uL    Comment: Performed at [facility], [place] [address], [place], [place] [address]  Glucose, capillary     Status: None   Collection Time: 09/07/17  7:39 AM  Result Value Ref Range   Glucose-Capillary 92 65 - 99 mg/dL    Imaging / Studies: No results found.  Medications / Allergies: per chart  Antibiotics: Anti-infectives (From admission, onward)   Start     Dose/Rate Route Frequency Ordered Stop   08/30/17 1000  ciprofloxacin (CIPRO) IVPB 200 mg  Status:  Discontinued     200 mg 100 mL/hr over 60 Minutes Intravenous Every 24 hours 08/30/17 0855 09/06/17 0805        Note: Portions of this report may have been transcribed using voice recognition software. Every effort was made to ensure accuracy; however, inadvertent computerized transcription errors may be present.   Any transcriptional errors that result from this process are  unintentional.     [name], M.D., F.A.C.S. Gastrointestinal and Minimally Invasive Surgery [facility] [address] [place], [place] [address] [phone] Main / Paging    09/07/2017

## 2017-09-07 NOTE — Progress Notes (Signed)
Occupational Therapy Treatment Patient Details Name: [name] MRN: [id number] DOB: [birth year] Today's Date: 09/07/2017    History of present illness Pt is a 62 year old female with hx of bilateral mastectomies for breast cancer, chronic neutropenia,  h/o ovarian and endometrial CA, h/o colostomy 2016, w/takedown/loop ileostomy diversion as well as resection of ureteral stricture w/ bladder hitch reimplantation all in 03/2017. Pt had her stent removed 08/17/17 and admitted 08/29/17 for acute renal failure.   OT comments  Pt tearful  This day as she had a situation in the bathroom in which she became weak and sat on the floor.  Rn aware.   Follow Up Recommendations  SNF    Equipment Recommendations  Other (comment)    Recommendations for Other Services      Precautions / Restrictions Precautions Precautions: Fall Precaution Comments: Multiple lines (catheter, bil PCN tubes, O2?) Restrictions Weight Bearing Restrictions: No       Mobility Bed Mobility               General bed mobility comments: OOB  Transfers Overall transfer level: Needs assistance Equipment used: Rolling walker (2 wheeled) Transfers: Sit to/from [facility] Sit to Stand: Min guard              Balance Overall balance assessment: Needs assistance         Standing balance support: Bilateral upper extremity supported Standing balance-Leahy Scale: Poor Standing balance comment: requring RW currently                           ADL either performed or assessed with clinical judgement   ADL                                         General ADL Comments: pt on way back from bathroom with CNA. OT Aed pt to chair and pt agreed to BUE HEP with orange theraband.                 Cognition Arousal/Alertness: Awake/alert Behavior During Therapy: WFL for tasks assessed/performed Overall Cognitive Status: Within Functional Limits for tasks assessed                                          Exercises Shoulder Exercises Shoulder Flexion: AROM;Seated;Strengthening;Both;Theraband;10 reps Shoulder ABduction: AROM;10 reps;Seated;Both;Strengthening;Theraband Elbow Flexion: AROM;10 reps;Theraband;Seated Elbow Extension: AROM;Strengthening;Both;Seated;Theraband   Shoulder Instructions       General Comments      Pertinent Vitals/ Pain       Pain Location: L back area around drain.  RN aware Pain Descriptors / Indicators: Sore Pain Intervention(s): Limited activity within patient's tolerance         Frequency  Min 2X/week        Progress Toward Goals  OT Goals(current goals can now be found in the care plan section)  Progress towards OT goals: Progressing toward goals     Plan         AM-PAC PT "6 Clicks" Daily Activity     Outcome Measure   Help from another person eating meals?: None Help from another person taking care of personal grooming?: None Help from another person toileting, which includes using toliet, bedpan, or urinal?: A Lot Help from another  person bathing (including washing, rinsing, drying)?: A Lot Help from another person to put on and taking off regular upper body clothing?: A Little Help from another person to put on and taking off regular lower body clothing?: A Lot 6 Click Score: 17    End of Session Equipment Utilized During Treatment: Rolling walker  OT Visit Diagnosis: Unsteadiness on feet (R26.81);Muscle weakness (generalized) (M62.81)   Activity Tolerance Patient tolerated treatment well   Patient Left in chair   Nurse Communication Mobility status        Time: 9694-0982 OT Time Calculation (min): 24 min  Charges: OT General Charges $OT Visit: 1 Visit OT Treatments $Therapeutic Exercise: 23-37 mins  [place], [place]   [name] 09/07/2017, 1:02 PM

## 2017-09-07 NOTE — Progress Notes (Signed)
PT Cancellation Note  Patient Details Name: [name] MRN: [id number] DOB: [birth year]   Cancelled Treatment:     pt on phone looking at her list of choices for skilled facility.  Pt stated she walked earlier.   Rica Koyanagi  PTA [facility]  Acute  Rehab Pager      [phone]

## 2017-09-07 NOTE — Clinical Social Work Note (Signed)
Clinical Social Work Assessment  Patient Details  Name: [name] MRN: [id number] Date of Birth: Nov 24, 1955  Date of referral:  09/07/17               Reason for consult:  Facility Placement                Permission sought to share information with:  Family Supports, Chartered certified accountant granted to share information::  Yes, Verbal Permission Granted  Name::        Agency::  SNF  Relationship::  Spouse  Contact Information:     Housing/Transportation Living arrangements for the past 2 months:  Single Family Home Source of Information:  Patient, Spouse Patient Interpreter Needed:  None Criminal Activity/Legal Involvement Pertinent to Current Situation/Hospitalization:  No - Comment as needed Significant Relationships:  Adult Children, Other Family Members, Spouse Lives with:  Spouse Do you feel safe going back to the place where you live?  Yes Need for family participation in patient care:  Yes (Comment)  Care giving concerns:   SNF Placement.   Social Worker assessment / plan: CSW met with patient and spouse at beside to discuss discharge plan to SNF. CSW provided patient with list of bed offers. Patient was hopeful to go to [name] but they are at capacity until next week. Patient reports she will only be agreeable to SNF if the facility in which she discharges to will allow her to transition to [name]. ([name] able to make offer next week if pt. Insurance approves)  Patient agreeable to [name].  CSW spoke with admission at SNF to confirm patient is able to transfer next week to [facility] if bed comes available and her insurance approves.   Plan: SNF -[name] has started authorization.    Employment status:  Retired Forensic scientist:  Other (Comment Required)(BCBS) PT Recommendations:  Home with [place], [place] / Referral to community resources:  [name]  Patient/Family's Response to care:  Patient reports, "I fell today I am too weak to go anywhere." Patient is agreeable for CSW to search for SNF.   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 Patient spouse is her primary caretaker and knowledgeable of patient diagnosis and current course of treatment.   Emotional Assessment Appearance:  Appears stated age Attitude/Demeanor/Rapport:    Affect (typically observed):  Accepting Orientation:  Oriented to Self, Oriented to Place, Oriented to  Time, Oriented to Situation Alcohol / Substance use:  Not Applicable Psych involvement (Current and /or in the community):  No (Comment)  Discharge Needs  Concerns to be addressed:  Discharge Planning Concerns Readmission within the last 30 days:  Yes Current discharge risk:  Dependent with Mobility Barriers to Discharge:  [place], LCSW 09/07/2017, 5:22 PM

## 2017-09-07 NOTE — Progress Notes (Signed)
PROGRESS NOTE    [name]  DGL:[id number] DOB: 11/11/1955 DOA: 08/29/2017 PCP: Ann Held, DO    Brief Narrative:  [name] is a 62 year old woman with a past medical history relevant for hyperlipidemia, type 2 diabetes, hypothyroidism, idiopathic neutropenia, breast cancer status post bilateral mastectomies, ovarian and endometrial cancer status post colostomy in 2016 and takedown in August 2018 with ileocecal resection and coloanal anastomosis and diverting loop ileostomy along with lysis of adhesions and right ureteral stent exchange with psoas bladder hitch.  Since that time she has had dramatically increased ostomy output and has not been able to keep up with her p.o. intake.  She did have a right ureteral stent removed in January 2019 however her creatinine has been chronically baseline around 3.  On admission a CT scan showed a new colovesicular fistula.  She was admitted for subacute renal failure of 4.28 however this is resolved.  Patient is now pending placement   Assessment & Plan:   Principal Problem:   Colovesical fistula - delayed Active Problems:   Primary hypothyroidism   DM type 2 (diabetes mellitus, type 2) ([place])   History of ovarian & endometrial cancer   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 s/p resection/partial vaginectomy/ LAR/colostomy 11/19/2014   Morbid obesity (HCC)   Ureteral stricture, right, 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) 04/01/2017   Pelvic cancer s/p colostomy takedown/loop ileostomy diversion 04/01/2017   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   Protein-calorie malnutrition, moderate (HCC)   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HCC)   Chronic kidney disease (CKD), stage III (moderate) (HCC)   Dehydration   ARF (acute renal failure) (HCC)   Adjustment disorder with mixed anxiety and depressed mood   Poorly controlled diabetes mellitus ([place])   Pressure injury of skin   History of external beam radiation  therapy to pelvis   ##) Colovesicular fistula: Developed after colostomy takedown, ileocecal resection and coloanal anastomosis, diverting loop ileostomy, lysis of adhesions and psoas bladder hitch after right ureteral stent exchange.  He continues to have small amounts of urine in her stool.  On 09/02/2017 she did have bilateral percutaneous nephrostomy tubes placed.  She is making excellent urine output at this time.  There is a plan for further surgical treatment of this fistula however this will be deferred to the future if she has had significant amounts of pelvic radiation and several prior surgeries. -Pending SNF -Continue metoclopramide 5 mg with meals -Continue as needed Compazine -Continue psyllium daily next line  ##) Increased ostomy output: -Has improved significantly  ##) Acute on chronic kidney injury: Creatinine is likely baseline at approximately 3 -Nephrology following appreciate recommendations  ##) Type 2 diabetes: Blood sugars well controlled now on insulin - Continue linagliptin 5 mg daily -Continue glargine 25 units twice a day -Continue aspart 7 units with meals -Continue sliding scale insulin and blood sugar checks with meals  ##) Hypothyroidism: -Continue levothyroxine 150 mcg daily  ##) Breast, endometrial, ovarian cancer: 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  She would strongly benefit from at least further outpatient psychiatry treatment. -Continue sertraline 50 mg daily -Continue gabapentin 75 mg daily  FEN: - fluids: tolerating PO - electrolytes: monitor and supp - nutrition: ADA diet  Prophy: enox 30mg   Dispo: SNF  FULL CODE  Consultants:    Surgery  Urology  Nephrology  Procedures:  09/02/2017: Bilateral nephrostomy tube placement 08/31/2017 CT abdomen pelvis without contrast IMPRESSION: 1. Rectovesical fistula, as above. 2. 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 3. Reflux of contrast material into the right kidney via the right ureter with moderate atrophy of the right kidney. Nonobstructive calculi in the lower pole collecting system of the left kidney measuring up to 11 mm.  4. Additional incidental findings, as above. 08/30/2017 renal until ultrasound: IMPRESSION: 1. Small nonobstructive calculi in the collecting systems of both kidneys. 2. Mild to moderate hydronephrosis.    Antimicrobials: (specify start and planned stop date. Auto populated tables are space occupying and do not give end dates)  None   Subjective: Patient is quite tearful in the room.  She reports feeling that she is very worried about going on dialysis at some point.  She also reports significant frustration with her quality of life due to the continued leakage of urine into her stool.  Objective: Vitals:   09/06/17 0920 09/06/17 1500 09/06/17 2240 09/07/17 0530  BP: (!) 110/56 (!) 132/58 139/64 131/72  Pulse: 98 81 84 94  Resp: 18 18 18 18   Temp: 99.9 F (37.7 C) 98.6 F (37 C) 98.2 F (36.8 C) 98.6 F (37 C)  TempSrc: Oral Oral Oral Oral  SpO2: 98% 100% 100% 100%  Weight:      Height:        Intake/Output Summary (Last 24 hours) at 09/07/2017 1337 Last data filed at 09/07/2017 1000 Gross per 24 hour  Intake 3770 ml  Output 3600 ml  Net 170 ml   Filed Weights   09/04/17 0500 09/05/17 0500 09/06/17 0443  Weight: 94.1 kg (207 lb 7.3 oz) 94.1 kg (207 lb 7.3 oz) 91.3 kg (201 lb 4.5 oz)    Examination:  General exam: No acute distress Respiratory system: Clear to auscultation. Respiratory effort  normal. Cardiovascular system: S1 & S2 heard, RRR. No JVD, murmurs, rubs, gallops or clicks. No pedal edema. Gastrointestinal system: Abdomen is nondistended, soft and nontender. No organomegaly or masses felt.  Incision sites are clean dry and intact, ostomy site is clean dry and intact [place] Central nervous system: Alert and oriented. No focal neurological deficits. Extremities: No lower extremity edema Skin: Nephrostomy tube sites are clean dry and intact Psychiatry: Depressed    Data Reviewed: I have personally reviewed following labs and imaging studies  CBC: Recent Labs  Lab 09/01/17 0406 09/02/17 0334 09/03/17 0455 09/04/17 0416 09/05/17 0433 09/07/17 0324  WBC 4.3 5.7 4.4 13.6* 5.6 4.2  NEUTROABS 3.1  --  3.3 11.8* 4.2  --   HGB 8.4* 8.1* 8.2* 8.6* 8.2* 8.2*  HCT 24.5* 23.3* 24.9* 26.7* 25.0* 26.1*  MCV 85.4 86.0 88.9 90.5 89.3 90.9  PLT 200 184 221 268 272 269   Basic Metabolic Panel: Recent Labs  Lab 09/03/17 0455 09/04/17 0416 09/05/17 0433 09/06/17 0348 09/07/17 0324  NA 132* 132* 132* 131* 131*  K 3.6 3.7 3.4* 3.7 3.7  CL 99* 101 101 101 102  CO2 23 23 22 22  21*  GLUCOSE 187* 209* 239* 156* 114*  BUN 56* 55* 52* 48* 47*  CREATININE 3.59* 3.61* 3.00* 3.23* 3.23*  CALCIUM 8.5* 8.4* 8.5* 8.4* 8.5*  MG 1.9 1.3* 2.0 1.4* 2.1  PHOS 3.8 3.8 3.4 3.5 4.2  GFR: Estimated Creatinine Clearance: 18.7 mL/min (A) (by C-G formula based on SCr of 3.23 mg/dL (H)). Liver Function Tests: Recent Labs  Lab 09/03/17 0455 09/04/17 0416 09/05/17 0433 09/06/17 0348 09/07/17 0324  ALBUMIN 1.9* 1.8* 1.8* 1.6* 1.9*   No results for input(s): LIPASE, AMYLASE in the last 168 hours. No results for input(s): AMMONIA in the last 168 hours. Coagulation Profile: Recent Labs  Lab 09/02/17 0939  INR 1.21   Cardiac Enzymes: No results for input(s): CKTOTAL, CKMB, CKMBINDEX, TROPONINI in the last 168 hours. BNP (last 3 results) No results for input(s): PROBNP in the last  8760 hours. HbA1C: No results for input(s): HGBA1C in the last 72 hours. CBG: Recent Labs  Lab 09/06/17 0721 09/06/17 1159 09/06/17 1719 09/06/17 2156 09/07/17 0739  GLUCAP 132* 259* 310* 204* 92   Lipid Profile: No results for input(s): CHOL, HDL, LDLCALC, TRIG, CHOLHDL, LDLDIRECT in the last 72 hours. Thyroid Function Tests: No results for input(s): TSH, T4TOTAL, FREET4, T3FREE, THYROIDAB in the last 72 hours. Anemia Panel: No results for input(s): VITAMINB12, FOLATE, FERRITIN, TIBC, IRON, RETICCTPCT in the last 72 hours. Sepsis Labs: No results for input(s): PROCALCITON, LATICACIDVEN in the last 168 hours.  Recent Results (from the past 240 hour(s))  Culture, Urine     Status: Abnormal   Collection Time: 08/30/17 10:10 AM  Result Value Ref Range Status   Specimen Description URINE, RANDOM  Final   Special Requests NONE  Final   Culture (A)  Final    >=100,000 COLONIES/mL SERRATIA MARCESCENS 80,000 COLONIES/mL GROUP B STREP(S.AGALACTIAE)ISOLATED TESTING AGAINST S. AGALACTIAE NOT ROUTINELY PERFORMED DUE TO PREDICTABILITY OF AMP/PEN/VAN SUSCEPTIBILITY. Performed at [facility], [place] [address], [place], [place] [address]    Report Status 09/01/2017 FINAL  Final   Organism ID, Bacteria SERRATIA MARCESCENS (A)  Final      Susceptibility   Serratia marcescens - MIC*    CEFAZOLIN >=64 RESISTANT Resistant     CEFTRIAXONE <=1 SENSITIVE Sensitive     CIPROFLOXACIN 1 SENSITIVE Sensitive     GENTAMICIN <=1 SENSITIVE Sensitive     NITROFURANTOIN 256 RESISTANT Resistant     TRIMETH/SULFA <=20 SENSITIVE Sensitive     * >=100,000 COLONIES/mL SERRATIA MARCESCENS  Culture, blood (Routine X 2) w Reflex to ID Panel     Status: None   Collection Time: 08/30/17  3:26 PM  Result Value Ref Range Status   Specimen Description   Final    BLOOD LEFT HAND Performed at [facility], [place] [address] [place], [place] [address]    Special Requests   Final    IN PEDIATRIC BOTTLE  Blood Culture adequate volume Performed at [facility] [address] [place], [place] [address]    Culture   Final    NO GROWTH 5 DAYS Performed at [facility], [place] [address], [name], [place] [address]    Report Status 09/04/2017 FINAL  Final  Culture, blood (Routine X 2) w Reflex to ID Panel     Status: None   Collection Time: 08/30/17  3:30 PM  Result Value Ref Range Status   Specimen Description   Final    BLOOD LEFT ANTECUBITAL Performed at [facility] [address], [place], [place] [address]    Special Requests   Final    IN PEDIATRIC BOTTLE Blood Culture adequate volume Performed at [facility] [address], [place],  [address]    Culture  Final    NO GROWTH 5 DAYS Performed at [facility], [place] [address], [place], [place] [address]    Report Status 09/04/2017 FINAL  Final  Culture, Urine     Status: Abnormal   Collection Time: 08/31/17  6:10 PM  Result Value Ref Range Status   Specimen Description   Final    URINE, CATHETERIZED Performed at [facility] [address] [place], [place] [address]    Special Requests   Final    NONE Performed at [facility], [place] [address] [place], [place] [address]    Culture >=100,000 COLONIES/mL SERRATIA MARCESCENS (A)  Final   Report Status 09/03/2017 FINAL  Final   Organism ID, Bacteria SERRATIA MARCESCENS (A)  Final      Susceptibility   Serratia marcescens - MIC*    CEFAZOLIN >=64 RESISTANT Resistant     CEFTRIAXONE <=1 SENSITIVE Sensitive     CIPROFLOXACIN 1 SENSITIVE Sensitive     GENTAMICIN <=1 SENSITIVE Sensitive     NITROFURANTOIN 256 RESISTANT Resistant     TRIMETH/SULFA <=20 SENSITIVE Sensitive     * >=100,000 COLONIES/mL SERRATIA MARCESCENS  Urine Culture     Status: Abnormal   Collection Time: 09/02/17  5:07 PM  Result Value Ref Range Status   Specimen Description   Final     URINE, RANDOM KIDNEY RIGHT Performed at [facility] [address] [place], [place] [address]    Special Requests   Final    NONE Performed at [facility], [place] [address], [place], [place] [address]    Culture (A)  Final    40,000 COLONIES/mL SERRATIA MARCESCENS 20,000 COLONIES/mL GROUP B STREP(S.AGALACTIAE)ISOLATED TESTING AGAINST S. AGALACTIAE NOT ROUTINELY PERFORMED DUE TO PREDICTABILITY OF AMP/PEN/VAN SUSCEPTIBILITY. >=100,000 COLONIES/mL LACTOBACILLUS SPECIES Standardized susceptibility testing for this organism is not available. 50,000 COLONIES/mL YEAST    Report Status 09/05/2017 FINAL  Final   Organism ID, Bacteria SERRATIA MARCESCENS (A)  Final      Susceptibility   Serratia marcescens - MIC*    CEFAZOLIN >=64 RESISTANT Resistant     CEFTRIAXONE <=1 SENSITIVE Sensitive     CIPROFLOXACIN 1 SENSITIVE Sensitive     GENTAMICIN <=1 SENSITIVE Sensitive     NITROFURANTOIN 256 RESISTANT Resistant     TRIMETH/SULFA <=20 SENSITIVE Sensitive     * 40,000 COLONIES/mL SERRATIA MARCESCENS  Urine Culture     Status: Abnormal   Collection Time: 09/02/17  5:07 PM  Result Value Ref Range Status   Specimen Description URINE, RANDOM  Final   Special Requests   Final    NONE Performed at [facility], [place] [address] [place], [place] [address]    Culture 80,000 COLONIES/mL SERRATIA MARCESCENS (A)  Final   Report Status 09/05/2017 FINAL  Final   Organism ID, Bacteria SERRATIA MARCESCENS (A)  Final      Susceptibility   Serratia marcescens - MIC*    CEFAZOLIN >=64 RESISTANT Resistant     CEFTRIAXONE <=1 SENSITIVE Sensitive     CIPROFLOXACIN 1 SENSITIVE Sensitive     GENTAMICIN <=1 SENSITIVE Sensitive     NITROFURANTOIN 256 RESISTANT Resistant     TRIMETH/SULFA <=20 SENSITIVE Sensitive     * 80,000 COLONIES/mL SERRATIA MARCESCENS         Radiology Studies: No results found.      Scheduled Meds: . acetaminophen  1,000 mg Oral  TID  . anastrozole  1 mg Oral Daily  .  artificial tears   Both Eyes QHS  . cholecalciferol  10,000 Units Oral Weekly  . enoxaparin (LOVENOX) injection  30 mg Subcutaneous Q24H  . famotidine  20 mg Oral Daily  . insulin aspart  0-20 Units Subcutaneous TID WC  . insulin aspart  0-5 Units Subcutaneous QHS  . insulin aspart  7 Units Subcutaneous TID WC  . insulin glargine  25 Units Subcutaneous BID  . levothyroxine  150 mcg Oral QAC breakfast  . linagliptin  5 mg Oral Daily  . lip balm  1 application Topical BID  . loratadine  10 mg Oral q morning - 10a  . metoCLOPramide  5 mg Oral TID AC  . polyvinyl alcohol  1 drop Both Eyes QHS  . pregabalin  75 mg Oral Daily  . prenatal multivitamin  1 tablet Oral Daily  . protein supplement shake  11 oz Oral BID BM  . psyllium  1 packet Oral Daily  . rosuvastatin  10 mg Oral QPM  . sertraline  50 mg Oral Daily  . Tbo-Filgrastim  480 mcg Subcutaneous Weekly   Continuous Infusions: . sodium chloride    . methocarbamol (ROBAXIN)  IV       LOS: 9 days    Time spent: [place], MD [facility]  If 7PM-7AM, please contact night-coverage [email or web address] Password [email or web address] 09/07/2017, 1:37 PM

## 2017-09-08 LAB — CBC
HCT: 25.2 % — ABNORMAL LOW (ref 36.0–46.0)
Hemoglobin: 7.7 g/dL — ABNORMAL LOW (ref 12.0–15.0)
MCH: 28.3 pg (ref 26.0–34.0)
MCHC: 30.6 g/dL (ref 30.0–36.0)
MCV: 92.6 fL (ref 78.0–100.0)
Platelets: 333 K/uL (ref 150–400)
RBC: 2.72 MIL/uL — ABNORMAL LOW (ref 3.87–5.11)
RDW: 15.3 % (ref 11.5–15.5)
WBC: 2.4 K/uL — ABNORMAL LOW (ref 4.0–10.5)

## 2017-09-08 LAB — GLUCOSE, CAPILLARY
Glucose-Capillary: 73 mg/dL (ref 65–99)
Glucose-Capillary: 207 mg/dL — ABNORMAL HIGH (ref 65–99)
Glucose-Capillary: 89 mg/dL (ref 65–99)
Glucose-Capillary: 219 mg/dL — ABNORMAL HIGH (ref 65–99)

## 2017-09-08 LAB — RENAL FUNCTION PANEL
ANION GAP: 7 (ref 5–15)
Albumin: 1.8 g/dL — ABNORMAL LOW (ref 3.5–5.0)
BUN: 51 mg/dL — ABNORMAL HIGH (ref 6–20)
CALCIUM: 8.5 mg/dL — AB (ref 8.9–10.3)
CO2: 20 mmol/L — AB (ref 22–32)
Chloride: 107 mmol/L (ref 101–111)
Creatinine, Ser: 2.88 mg/dL — ABNORMAL HIGH (ref 0.44–1.00)
GFR, EST AFRICAN AMERICAN: 19 mL/min — AB (ref >60)
GFR, EST NON AFRICAN AMERICAN: 16 mL/min — AB (ref >60)
Glucose, Bld: 150 mg/dL — ABNORMAL HIGH (ref 65–99)
PHOSPHORUS: 5.1 mg/dL — AB (ref 2.5–4.6)
Potassium: 3.8 mmol/L (ref 3.5–5.1)
SODIUM: 134 mmol/L — AB (ref 135–145)

## 2017-09-08 LAB — MAGNESIUM: Magnesium: 1.7 mg/dL (ref 1.7–2.4)

## 2017-09-08 MED ORDER — SODIUM CHLORIDE 0.9 % IV SOLN
510.0000 mg | Freq: Once | INTRAVENOUS | Status: AC
  Administered 2017-09-08: 510 mg via INTRAVENOUS
  Filled 2017-09-08: qty 17

## 2017-09-08 MED ORDER — SODIUM CHLORIDE 0.9 % IV SOLN: 500.0000 mg | Freq: Once | INTRAVENOUS | Status: DC

## 2017-09-08 MED ORDER — HYDROMORPHONE HCL 2 MG PO TABS
2.0000 mg | ORAL_TABLET | ORAL | Status: DC | PRN
  Administered 2017-09-08: 4 mg via ORAL
  Filled 2017-09-08: qty 2

## 2017-09-08 MED ORDER — HYDROMORPHONE HCL 1 MG/ML IJ SOLN
0.5000 mg | INTRAMUSCULAR | Status: DC | PRN
  Administered 2017-09-08 (×3): 1 mg via INTRAVENOUS
  Administered 2017-09-09 – 2017-09-10 (×9): 2 mg via INTRAVENOUS
  Administered 2017-09-11: 1 mg via INTRAVENOUS
  Administered 2017-09-11 (×6): 2 mg via INTRAVENOUS
  Administered 2017-09-11: 1 mg via INTRAVENOUS
  Administered 2017-09-12 (×5): 2 mg via INTRAVENOUS
  Filled 2017-09-08: qty 2
  Filled 2017-09-08: qty 1
  Filled 2017-09-08 (×4): qty 2
  Filled 2017-09-08: qty 1
  Filled 2017-09-08 (×2): qty 2
  Filled 2017-09-08: qty 1
  Filled 2017-09-08: qty 2
  Filled 2017-09-08: qty 1
  Filled 2017-09-08: qty 2
  Filled 2017-09-08: qty 1
  Filled 2017-09-08 (×12): qty 2

## 2017-09-08 MED ORDER — METHOCARBAMOL 500 MG PO TABS
1000.0000 mg | ORAL_TABLET | Freq: Four times a day (QID) | ORAL | Status: DC
  Administered 2017-09-08 – 2017-09-11 (×14): 1000 mg via ORAL
  Filled 2017-09-08 (×14): qty 2

## 2017-09-08 MED ORDER — SODIUM CHLORIDE 0.9 % IV SOLN: 25.0000 mg | Freq: Once | INTRAVENOUS | Status: DC

## 2017-09-08 MED ORDER — FERROUS SULFATE 325 (65 FE) MG PO TABS
325.0000 mg | ORAL_TABLET | Freq: Two times a day (BID) | ORAL | Status: DC
  Administered 2017-09-08 – 2017-09-13 (×10): 325 mg via ORAL
  Filled 2017-09-08 (×10): qty 1

## 2017-09-08 NOTE — Progress Notes (Signed)
Physical Therapy Treatment Patient Details Name: [name] MRN: [id number] DOB: Sep 17, 1955 Today's Date: 09/08/2017    History of Present Illness Pt is a 62 year old female with hx of bilateral mastectomies for breast cancer, chronic neutropenia,  h/o ovarian and endometrial CA, h/o colostomy 2016, w/takedown/loop ileostomy diversion as well as resection of ureteral stricture w/ bladder hitch reimplantation all in 03/2017. Pt had her stent removed 08/17/17 and admitted 08/29/17 for acute renal failure.    PT Comments    Progressing with mobility. Pt c/o back soreness since fall on yesterday. She stated she feels weak and sore. Pt agree to stand and pivot to recliner with use of RW. She declined ambulation at this time but stated she will walk later. Will continue to follow. Per chart, plan is for SNF for rehab and skilled nursing care.     Follow Up Recommendations  SNF     Equipment Recommendations  None recommended by PT    Recommendations for Other Services       Precautions / Restrictions Precautions Precautions: Fall Precaution Comments: Multiple lines (catheter, bil PCN tubes) Restrictions Weight Bearing Restrictions: No    Mobility  Bed Mobility               General bed mobility comments: sitting EOB with NT  Transfers Overall transfer level: Needs assistance Equipment used: Rolling walker (2 wheeled) Transfers: Sit to/from [facility] Sit to Stand: Min guard Stand pivot transfers: Min guard       General transfer comment: close guard for safety. Stand pivot, bed to recliner, with RW.   Ambulation/Gait             General Gait Details: Pt declined ambulation at this time.    Stairs            Wheelchair Mobility    Modified Rankin (Stroke Patients Only)       Balance                                            Cognition Arousal/Alertness: Awake/alert Behavior During Therapy: WFL for tasks  assessed/performed Overall Cognitive Status: Within Functional Limits for tasks assessed                                 General Comments: pt is very emotional about current situation.       Exercises      General Comments        Pertinent Vitals/Pain Pain Assessment: Faces Faces Pain Scale: Hurts even more Pain Location: all over, especially back since fall  Pain Descriptors / Indicators: Sore Pain Intervention(s): Limited activity within patient's tolerance    Home Living                      Prior Function            PT Goals (current goals can now be found in the care plan section) Progress towards PT goals: Progressing toward goals    Frequency    Min 3X/week      PT Plan Current plan remains appropriate    Co-evaluation              AM-PAC PT "6 Clicks" Daily Activity  Outcome Measure  Difficulty turning over in bed (including adjusting  bedclothes, sheets and blankets)?: A Little Difficulty moving from lying on back to sitting on the side of the bed? : A Little Difficulty sitting down on and standing up from a chair with arms (e.g., wheelchair, bedside commode, etc,.)?: A Little Help needed moving to and from a bed to chair (including a wheelchair)?: A Little Help needed walking in hospital room?: A Little Help needed climbing 3-5 steps with a railing? : A Lot 6 Click Score: 17    End of Session   Activity Tolerance: Patient limited by pain Patient left: in chair;with call bell/phone within reach   PT Visit Diagnosis: Difficulty in walking, not elsewhere classified (R26.2);Pain Pain - Right/Left: (back, drain sites)     Time: 1959-7471 PT Time Calculation (min) (ACUTE ONLY): 9 min  Charges:  $Therapeutic Activity: 8-22 mins                    G Codes:         Weston Anna, MPT Pager: [phone]

## 2017-09-08 NOTE — Progress Notes (Signed)
[facility] Rounding Note  Subjective/Interval History:  Creat down 2.8 today  Objective Vital signs in last 24 hours: Vitals:   09/07/17 0530 09/07/17 1315 09/07/17 2208 09/08/17 0437  BP: 131/72 (!) 122/49 (!) 126/57 (!) 121/52  Pulse: 94 79 88 79  Resp: 18 17 16 17   Temp: 98.6 F (37 C) 98.8 F (37.1 C) 98.6 F (37 C) 98.6 F (37 C)  TempSrc: Oral Oral Oral Oral  SpO2: 100%  99% 99%  Weight:    86.9 kg (191 lb 9.3 oz)  Height:       Weight change:   Intake/Output Summary (Last 24 hours) at 09/08/2017 0925 Last data filed at 09/08/2017 9381 Gross per 24 hour  Intake 635 ml  Output 3175 ml  Net -2540 ml   Physical examination BP (!) 121/52 (BP Location: Right Leg)   Pulse 79   Temp 98.6 F (37 C) (Oral)   Resp 17   Ht 5\' 2"  (1.575 m)   Wt 86.9 kg (191 lb 9.3 oz)   SpO2 99%   BMI 35.04 kg/m   Pale WF BP stable Alert, looks better No jvd Chest clear bilat RRR no mrg Abd with ileostomy bag that has leaked overnight Remains mildly tender in RLQ, +BS Now only trace LE edema PICC R arm  Assessment/Recommendations: 1. Acute kidney injury - On admission 2/2 inadequate intake in face of high output ileostomy. Some degree of hydro chronically. New urine leak identified with rectovesical fistula. Renal failure present since at least 08/17/17 with SCr 3 when ureteral stent removed (baseline 1.0 in November '18). Suspect ATN from prolonged volume depletion and /or mechanical obstruction.  Is now sp bilat PCN.  Creat may be improving some now, down to 2.8 today.  Good uop, good BP.  No further suggestions.  She has appt already for renal f/u with Dr Hollie Salk.  Will sign off.  2. Hypokalemia -Stable post repletion 3. Hypomagnesemia - Repleted 2/1 4. Polymicrobial UTI - serratia and Group B strep. IV Cipro. BC's neg. Fevers better 5. Chronic neutropenia - back on Rx  6. Depression/anxiety - per [facility]   Kelly Splinter MD [place] pgr [phone]   09/06/2017,  5:43 PM  Recent Labs  Lab 09/02/17 0334 09/03/17 0455 09/04/17 0416 09/05/17 0433 09/06/17 0348 09/07/17 0324 09/08/17 0355  NA 125* 132* 132* 132* 131* 131* 134*  K 3.3* 3.6 3.7 3.4* 3.7 3.7 3.8  CL 94* 99* 101 101 101 102 107  CO2 22 23 23 22 22  21* 20*  GLUCOSE 357* 187* 209* 239* 156* 114* 150*  BUN 59* 56* 55* 52* 48* 47* 51*  CREATININE 3.61* 3.59* 3.61* 3.00* 3.23* 3.23* 2.88*  CALCIUM 7.6* 8.5* 8.4* 8.5* 8.4* 8.5* 8.5*  PHOS 2.5 3.8 3.8 3.4 3.5 4.2 5.1*   Recent Labs  Lab 09/06/17 0348 09/07/17 0324 09/08/17 0355  ALBUMIN 1.6* 1.9* 1.8*    Recent Labs  Lab 09/03/17 0455 09/04/17 0416 09/05/17 0433 09/07/17 0324 09/08/17 0355  WBC 4.4 13.6* 5.6 4.2 2.4*  NEUTROABS 3.3 11.8* 4.2  --   --   HGB 8.2* 8.6* 8.2* 8.2* 7.7*  HCT 24.9* 26.7* 25.0* 26.1* 25.2*  MCV 88.9 90.5 89.3 90.9 92.6  PLT 221 268 272 321 333    Recent Labs  Lab 09/07/17 0739 09/07/17 1351 09/07/17 1752 09/07/17 2206 09/08/17 0715  GLUCAP 92 227* 210* 142* 73   09/03/17 Mg 1.9 09/04/17 Mg 1.3  Studies/Results: No results found. Medications: .  methocarbamol (ROBAXIN)  IV     . acetaminophen  1,000 mg Oral TID  . anastrozole  1 mg Oral Daily  . artificial tears   Both Eyes QHS  . cholecalciferol  10,000 Units Oral Weekly  . enoxaparin (LOVENOX) injection  30 mg Subcutaneous Q24H  . famotidine  20 mg Oral Daily  . insulin aspart  0-20 Units Subcutaneous TID WC  . insulin aspart  0-5 Units Subcutaneous QHS  . insulin aspart  7 Units Subcutaneous TID WC  . insulin glargine  25 Units Subcutaneous BID  . levothyroxine  150 mcg Oral QAC breakfast  . linagliptin  5 mg Oral Daily  . lip balm  1 application Topical BID  . loratadine  10 mg Oral q morning - 10a  . metoCLOPramide  5 mg Oral TID AC  . polyvinyl alcohol  1 drop Both Eyes QHS  . pregabalin  75 mg Oral Daily  . prenatal multivitamin  1 tablet Oral Daily  . protein supplement shake  11 oz Oral BID BM  . psyllium  1 packet  Oral Daily  . rosuvastatin  10 mg Oral QPM  . sertraline  50 mg Oral Daily  . Tbo-Filgrastim  480 mcg Subcutaneous Weekly

## 2017-09-08 NOTE — Progress Notes (Addendum)
CSW met with patient at bedside to review additional SNF offers.   Patient declines all SNF options presented.    If the patient is medically ready before [name] is able to offer a bed (earliest possibly next week), csw unsure how to assist the patient with a safe D/C to SNF.   CSW informed RNCM to discuss home options as well.   Kathrin Greathouse, Latanya Presser, MSW Clinical Social Worker  [phone] 09/08/2017  11:29 AM

## 2017-09-08 NOTE — Progress Notes (Signed)
Occupational Therapy Treatment Patient Details Name: [name] MRN: [id number] DOB: Sep 28, 1955 Today's Date: 09/08/2017    History of present illness Pt is a 62 year old female with hx of bilateral mastectomies for breast cancer, chronic neutropenia,  h/o ovarian and endometrial CA, h/o colostomy 2016, w/takedown/loop ileostomy diversion as well as resection of ureteral stricture w/ bladder hitch reimplantation all in 03/2017. Pt had her stent removed 08/17/17 and admitted 08/29/17 for acute renal failure.   OT comments  Pt wanted to perform BUE HEP  Follow Up Recommendations  SNF    Equipment Recommendations  Other (comment)    Recommendations for Other Services      Precautions / Restrictions Precautions Precautions: Fall Precaution Comments: Multiple lines (catheter, bil PCN tubes) Restrictions Weight Bearing Restrictions: No       Mobility Bed Mobility               General bed mobility comments: sitting EOB with NT  Transfers Overall transfer level: Needs assistance Equipment used: Rolling walker (2 wheeled) Transfers: Sit to/from [facility] Sit to Stand: Min guard Stand pivot transfers: Min guard       General transfer comment: close guard for safety. Stand pivot, bed to recliner, with RW.     Balance                                           ADL either performed or assessed with clinical judgement   ADL                                         General ADL Comments: pt had just gotten back to bed.  Pt requested to perform BUE with orange theraband with OT     Vision Baseline Vision/History: No visual deficits     Perception     Praxis      Cognition Arousal/Alertness: Awake/alert Behavior During Therapy: WFL for tasks assessed/performed Overall Cognitive Status: Within Functional Limits for tasks assessed                                 General Comments: pt is very emotional  about current situation.         Exercises Shoulder Exercises Shoulder Flexion: AROM;Strengthening;Both;Theraband;10 reps;Supine Shoulder ABduction: AROM;10 reps;Both;Strengthening;Theraband;Supine Elbow Flexion: AROM;10 reps;Theraband;Supine Elbow Extension: AROM;Strengthening;Both;Theraband;Supine   Shoulder Instructions       General Comments      Pertinent Vitals/ Pain       Pain Assessment: Faces Pain Score: 4  Faces Pain Scale: Hurts even more Pain Location: r abdomen area Pain Descriptors / Indicators: Sore Pain Intervention(s): Limited activity within patient's tolerance;Monitored during session  Home Living                                          Prior Functioning/Environment              Frequency  Min 2X/week        Progress Toward Goals  OT Goals(current goals can now be found in the care plan section)  Progress towards OT goals: Progressing toward goals  Plan Discharge plan remains appropriate    Co-evaluation                 AM-PAC PT "6 Clicks" Daily Activity     Outcome Measure   Help from another person eating meals?: None Help from another person taking care of personal grooming?: None Help from another person toileting, which includes using toliet, bedpan, or urinal?: A Lot Help from another person bathing (including washing, rinsing, drying)?: A Lot Help from another person to put on and taking off regular upper body clothing?: A Little Help from another person to put on and taking off regular lower body clothing?: A Lot 6 Click Score: 17    End of Session    OT Visit Diagnosis: Unsteadiness on feet (R26.81);Muscle weakness (generalized) (M62.81)   Activity Tolerance Patient tolerated treatment well   Patient Left in bed;with call bell/phone within reach   Nurse Communication Mobility status        Time: 7373-6681 OT Time Calculation (min): 17 min  Charges: OT General Charges $OT Visit: 1  Visit OT Treatments $Therapeutic Exercise: 8-22 mins  [name], [name]   [name] 09/08/2017, 2:30 PM

## 2017-09-08 NOTE — Consult Note (Signed)
[facility] Nurse ostomy follow up Stoma type/location: RLQ Ileostomy Stomal assessment/size: see note from 2/6 Peristomal assessment: NA Treatment options for stomal/peristomal skin: complex, see note 2/6 Output  Ostomy pouching: 1 piece convex Education provided:  Enrolled patient in [employer] Discharge program: previously Checked in with pt to make sure no leaks after yesterdays pouch change. Pt states it is on good and no leaks present. Increased pain, asked for pain medication for patient at her request. We will follow this patient and remain available to this patient, nursing, and the medical and surgical teams.  Fara Olden, RN-C, WTA-C, [place] Wound Treatment Associate Ostomy Care Associate

## 2017-09-08 NOTE — Progress Notes (Signed)
[address] [place], [place] [address] Phone: [phone]  FAX: [phone]      Adhya Cocco [id number] [birth year]  CARE TEAM:  PCP: Ann Held, DO  Outpatient Care Team: Patient Care Team: Carollee Herter, Alferd Apa, DO as PCP - General (Family Medicine) Heath Lark, MD as Consulting Physician (Hematology and Oncology) Everitt Amber, MD as Consulting Physician (Obstetrics and Gynecology) Michael [name], MD as Consulting Physician (General Surgery) Alexis Frock, MD as Consulting Physician (Urology) Altheimer, Legrand Como, MD as Consulting Physician (Endocrinology) Katy Apo, MD as Consulting Physician (Ophthalmology) Irene Limbo, MD as Consulting Physician (Plastic Surgery) Madelon Lips, MD as Consulting Physician (Nephrology)  Inpatient Treatment Team: Treatment Team: Attending Provider: Damita Lack, MD; Consulting Physician: Alexis Frock, MD; Registered Nurse: Illene Regulus, RN; Technician: Reuel Derby, NT; Consulting Physician: Fatima Blank, MD; Consulting Physician: Michael [name], MD; Technician: Leda Quail, NT; Consulting Physician: Patrecia Pour, MD; Consulting Physician: Heath Lark, MD; Technician: Estrellita Ludwig, [place]; Rounding Team: Dorthy Cooler Radiology, MD; Consulting Physician: Edison Pace, Md, MD; Physical Therapist: Alphonzo Severance, PT; Technician: Resa Miner, NT   Problem List:   Principal Problem:   Colovesical fistula - delayed Active Problems:   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 s/p resection/partial vaginectomy/ LAR/colostomy 11/19/2014   Ureteral stricture, right, 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) 04/01/2017   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   Primary hypothyroidism   DM type 2 (diabetes mellitus, type 2) ([place])   History of ovarian & endometrial cancer   Morbid obesity ([name])   Pelvic  cancer s/p colostomy takedown/loop ileostomy diversion 04/01/2017   Protein-calorie malnutrition, moderate ([place])   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[place])   Chronic kidney disease (CKD), stage III (moderate) ([facility])   Dehydration   ARF (acute renal failure) ([place])   Adjustment disorder with mixed anxiety and depressed mood   Poorly controlled diabetes mellitus ([place])   Pressure injury of skin   History of external beam radiation therapy to pelvis  SURGERY 04/01/2017     POST-OPERATIVE DIAGNOSIS:   Recurrent endometrial cancer status post low anterior resection and partial pelvic exeneration  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   SURGEON:Cleavon Goldman Gwynneth Aliment, MD- Primary (Dr Tresa Moore assist) PROCEDURE:  XI ROBOTIC LYSIS OF ADHESIONS x 5 hours XI ROBOTIC COLOSTOMY TAKEDOWN ROBOTIC SEWN COLOANAL ANASTOMOSIS Ileocecal resection with anastomosis Diverting loop ileostomy  SURGEON: Alexis Frock, MD - Primary (Dr Johney Maine assist) PROCEDURE:  Cystoscopy with R retrograde and stent exchange.  Robotic R ureteral reimplant with psoas bladder hitch.  Consults: urology   SURGERY 08/17/2017  DIAGNOSIS:  Right ureteral stricture, status post complex reconstruction.  PROCEDURES: 1. Cystoscopy with right retrograde pyelogram. 2. Removal of right ureteral stent.  PHYSICIAN:  Alexis Frock, MD        SPECIMENS:  Right ureteral stone for discard, inspected and intact.  FINDINGS: 1. Fusiform bladder shape consistent with prior psoas hitch. 2. Visibly patent ureteral-bladder anastomosis. 3. No evidence of hydronephrosis or narrowing with right retrograde     pyelogram.    Assessment  Struggling  Delayed bladder leak turned into colovesical fistula with preferential urinary drainage in decompressed diverted colon limb.  Plan:   Inadequate pain control.  Try oral Dilaudid.  Scheduled Robaxin.  Increase Tylenol.  Still needing IV pain meds  Iron deficiency anemia  with borderline hemoglobin.  Give her another dose of IV dextran.  Retry oral iron.  She had  difficulty absorbing and tolerating.  With urine leak better controlled, perhaps she can tolerate better now  Discussed with urology.  Dr. Tresa Moore.  Discussed with nephrology, Dr. Lorrene Reid.  S/p percutaneous nephrostomy tubes to divert urine directly out of kidneys and out of the bladder that is draining into her neorectum/colon.  Still needing Foley catheter.  Still leaking urine output anus.  Somewhat improved.  Follow off Cipro  Lyrica for chronic pain .  Zoloft for depression.  Improve diabetic control.  Diabetes better controlled.    Solid diet.  Bowel regimen.  Seems to be thickening up.    Ostomy care  Orthostatic vital signs.  No evidence of any dehydration nor active bleeding  -Hemoglobin low but stable in pancytopenic pt  -Continue neupogen injections for pancytopenia  -VTE prophylaxis: SCDs, etc  -Ambulate as tolerated to help recovery    Perhaps it can close down this way.  As mentioned before, I am highly skeptical that given her radiated bladder that had been aggressively treated with internal/external drainage and stenting and still developed a problem.  Most likely this is a leak on the left lateral inferiolateral corner of the cystostomy Heineke-Mikulicz and psoas hitch needed for the bladder to reach the right ureter at that had the chronic severe distal stricture.  I am highly skeptical that a radiated bladder with a delayed leak will spontaneously heal.  She had internal Foley drainage, external surgical drainage, internal stenting drainage for a few months, yet this happened.   Therefore, most likely will require surgical repair.  Standard of care usually on this situation is to resect colon & do a new distal anastomosis. Primarily repair the bladder & patch it.  However, there is no way I can redo anastomosis in a radiated pelvis with prior surgeries with prior anterior  exoneration, radiation, 7-hour lysis adhesions and prior surgery when she was in better shape.  Only options I can see is to try & primarily patch the colon with OMENTOPEXY, primarily repair the bladder with OMENTOPEXY, and hope it will heal.  I am skeptical that it will.  Most likely we will have to convert to an end colostomy to get the colon away from it and allow the bladder to be patched.  I do agree with Dr. Tresa Moore that she is rather malnourished and deconditioned at this point so would like to try and hold off on definitive repair if we can.  I do not think a permanent loop ileostomy is going to be a good solution for her as she cannot tolerate it well.  We will see.  She actually talked about converting to a colostomy.  She would really like it to be infraumbilical so she can hide it in her pants.  I have found trying to put colostomies through panniculis fails pretty badly.  Also risks increased parastomal hernia or risk with doing that.  I suspect that her bladder is probably not salvageable and she may need a completion cystectomy with ileal conduit.  I will defer to Dr. Zettie Pho expertise with urology.  We will discuss that later once she is in better shape to actually tolerate a reoperation should it come to that (very likely).    Patient and husband still hoping that she could be my patient.  I noted I will keep fighting with her despite her complicated medical and surgical history giving her more problems.  She is still quite emotionally devastated and exhausted.  She still gets tearful and nearly starts crying  but is a little less histrionic today.  I did note that it is reasonable to get a second opinion.  She short of brought this up on her own.  I think she is hoping for solution that avoids any bags.  I told her that is highly unlikely.  I tried to warn her she may end up with both a urostomy bag and a colostomy bag to eventually get through this if the bladder refuses to heal.  We will see.   They expressed understanding and appreciation.  Hopefully we can get her to a better situation.    Home health nursing seems to have failed.  Lack of outpatient ostomy clinic in town here making outpatient management challenging.   I strongly suspect she will need a SNF.  Patient more motivated to get up and move around.  She is still embarrassed in frustrated with persistent urine leak out anus.  But she is getting up some.  Work to try and transition to outpatient rehab with SNF later in the week      40 minutes spent in review, evaluation, examination, counseling, and coordination of care.  More than 50% of that time was spent in counseling.  [name], M.D., F.A.C.S. Gastrointestinal and Minimally Invasive Surgery [facility] [address] [place], [place] [address] [phone] Main / Paging    09/08/2017    Subjective: (Chief complaint)  Crying in pain from right back nephrostomy tube site.  IV medication only slightly touching it.  Trying to walk but worried about falling.  Not orthostatic.  Tolerating food better.  Ostomy output better controlled.  Objective:  Vital signs:  Vitals:   09/07/17 0530 09/07/17 1315 09/07/17 2208 09/08/17 0437  BP: 131/72 (!) 122/49 (!) 126/57 (!) 121/52  Pulse: 94 79 88 79  Resp: 18 17 16 17   Temp: 98.6 F (37 C) 98.8 F (37.1 C) 98.6 F (37 C) 98.6 F (37 C)  TempSrc: Oral Oral Oral Oral  SpO2: 100%  99% 99%  Weight:    86.9 kg (191 lb 9.3 oz)  Height:        Last BM Date: 09/07/17  Intake/Output   Yesterday:  02/06 0701 - 02/07 0700 In: 600 [P.O.:300; I.V.:10] Out: 2875 [Urine:2125; Stool:750] This shift:  Total I/O In: 360 [P.O.:360] Out: 825 [Urine:650; Stool:175]  Bowel function:  Flatus: YES   BM:  YES.  Thick oatmeal consistency succus in ileostomy bag.  Drain: No drains.     Physical Exam:  General: Pt awake/alert/oriented x4 in severe acute distress. Eyes:  PERRL, normal EOM.  Sclera clear.  No icterus Neuro: CN II-XII intact w/o focal sensory/motor deficits. Lymph: No head/neck/groin lymphadenopathy Psych:  No delerium/psychosis/paranoia.  Crying but consolable HENT: Normocephalic, Mucus membranes moist.  No thrush Neck: Supple, No tracheal deviation Chest: No chest wall pain w good excursion CV:  Pulses intact.  Regular rhythm MS: Normal AROM mjr joints.  No obvious deformity.  Bilateral perc nephrostomy bags  Abdomen: Soft.  Nondistended.  No tenderness to palpation.  No evidence of peritonitis.  No incarcerated hernias.  No wounds.  Ileostomy pink with gas and succus in bag.      GU.  No purulent drainage or hematoma.  Foley in place with clear light yellow urine  Rectal deferred.  Last week had stricturing at coloanal anastomosis.  Had been finger dilated.  Held off on that today. Ext:   No deformity.  No mjr edema.  No cyanosis Skin: No petechiae / purpura  Results:   Labs: Results for orders placed or performed during the hospital encounter of 08/29/17 (from the past 48 hour(s))  Glucose, capillary     Status: Abnormal   Collection Time: 09/06/17 11:59 AM  Result Value Ref Range   Glucose-Capillary 259 (H) 65 - 99 mg/dL  Glucose, capillary     Status: Abnormal   Collection Time: 09/06/17  5:19 PM  Result Value Ref Range   Glucose-Capillary 310 (H) 65 - 99 mg/dL  Glucose, capillary     Status: Abnormal   Collection Time: 09/06/17  9:56 PM  Result Value Ref Range   Glucose-Capillary 204 (H) 65 - 99 mg/dL  Magnesium     Status: None   Collection Time: 09/07/17  3:24 AM  Result Value Ref Range   Magnesium 2.1 1.7 - 2.4 mg/dL    Comment: Performed at [facility], [place] [address], [place], [place] [address]  Renal function panel     Status: Abnormal   Collection Time: 09/07/17  3:24 AM  Result Value Ref Range   Sodium 131 (L) 135 - 145 mmol/L   Potassium 3.7 3.5 - 5.1 mmol/L   Chloride 102 101 - 111 mmol/L    CO2 21 (L) 22 - 32 mmol/L   Glucose, Bld 114 (H) 65 - 99 mg/dL   BUN 47 (H) 6 - 20 mg/dL   Creatinine, Ser 3.23 (H) 0.44 - 1.00 mg/dL   Calcium 8.5 (L) 8.9 - 10.3 mg/dL   Phosphorus 4.2 2.5 - 4.6 mg/dL   Albumin 1.9 (L) 3.5 - 5.0 g/dL   GFR calc non Af Amer 14 (L) >60 mL/min   GFR calc Af Amer 16 (L) >60 mL/min    Comment: (NOTE) The eGFR has been calculated using the CKD EPI equation. This calculation has not been validated in all clinical situations. eGFR's persistently <60 mL/min signify possible Chronic Kidney Disease.    Anion gap 8 5 - 15    Comment: Performed at [name], [place] [address], [place], [place] [address]  CBC     Status: Abnormal   Collection Time: 09/07/17  3:24 AM  Result Value Ref Range   WBC 4.2 4.0 - 10.5 K/uL   RBC 2.87 (L) 3.87 - 5.11 MIL/uL   Hemoglobin 8.2 (L) 12.0 - 15.0 g/dL   HCT 26.1 (L) 36.0 - 46.0 %   MCV 90.9 78.0 - 100.0 fL   MCH 28.6 26.0 - 34.0 pg   MCHC 31.4 30.0 - 36.0 g/dL   RDW 15.3 11.5 - 15.5 %   Platelets 321 150 - 400 K/uL    Comment: Performed at [facility], [place] [address], [place], [place] [address]  Glucose, capillary     Status: None   Collection Time: 09/07/17  7:39 AM  Result Value Ref Range   Glucose-Capillary 92 65 - 99 mg/dL  Glucose, capillary     Status: Abnormal   Collection Time: 09/07/17  1:51 PM  Result Value Ref Range   Glucose-Capillary 227 (H) 65 - 99 mg/dL  Glucose, capillary     Status: Abnormal   Collection Time: 09/07/17  5:52 PM  Result Value Ref Range   Glucose-Capillary 210 (H) 65 - 99 mg/dL  Glucose, capillary     Status: Abnormal   Collection Time: 09/07/17 10:06 PM  Result Value Ref Range   Glucose-Capillary 142 (H) 65 - 99 mg/dL  Magnesium  Status: None   Collection Time: 09/08/17  3:55 AM  Result Value Ref Range   Magnesium 1.7 1.7 - 2.4 mg/dL    Comment: Performed at [facility], [place] [address], [place], [place] [address]   Renal function panel     Status: Abnormal   Collection Time: 09/08/17  3:55 AM  Result Value Ref Range   Sodium 134 (L) 135 - 145 mmol/L   Potassium 3.8 3.5 - 5.1 mmol/L   Chloride 107 101 - 111 mmol/L   CO2 20 (L) 22 - 32 mmol/L   Glucose, Bld 150 (H) 65 - 99 mg/dL   BUN 51 (H) 6 - 20 mg/dL   Creatinine, Ser 2.88 (H) 0.44 - 1.00 mg/dL   Calcium 8.5 (L) 8.9 - 10.3 mg/dL   Phosphorus 5.1 (H) 2.5 - 4.6 mg/dL   Albumin 1.8 (L) 3.5 - 5.0 g/dL   GFR calc non Af Amer 16 (L) >60 mL/min   GFR calc Af Amer 19 (L) >60 mL/min    Comment: (NOTE) The eGFR has been calculated using the CKD EPI equation. This calculation has not been validated in all clinical situations. eGFR's persistently <60 mL/min signify possible Chronic Kidney Disease.    Anion gap 7 5 - 15    Comment: Performed at [facility], [place] [address] [place], [place] [address]  CBC     Status: Abnormal   Collection Time: 09/08/17  3:55 AM  Result Value Ref Range   WBC 2.4 (L) 4.0 - 10.5 K/uL   RBC 2.72 (L) 3.87 - 5.11 MIL/uL   Hemoglobin 7.7 (L) 12.0 - 15.0 g/dL   HCT 25.2 (L) 36.0 - 46.0 %   MCV 92.6 78.0 - 100.0 fL   MCH 28.3 26.0 - 34.0 pg   MCHC 30.6 30.0 - 36.0 g/dL   RDW 15.3 11.5 - 15.5 %   Platelets 333 150 - 400 K/uL    Comment: Performed at [facility], [place] [address] [place], [place] [address]  Glucose, capillary     Status: None   Collection Time: 09/08/17  7:15 AM  Result Value Ref Range   Glucose-Capillary 73 65 - 99 mg/dL    Imaging / Studies: No results found.  Medications / Allergies: per chart  Antibiotics: Anti-infectives (From admission, onward)   Start     Dose/Rate Route Frequency Ordered Stop   08/30/17 1000  ciprofloxacin (CIPRO) IVPB 200 mg  Status:  Discontinued     200 mg 100 mL/hr over 60 Minutes Intravenous Every 24 hours 08/30/17 0855 09/06/17 0805        Note: Portions of this report may have been transcribed using voice recognition  software. Every effort was made to ensure accuracy; however, inadvertent computerized transcription errors may be present.   Any transcriptional errors that result from this process are unintentional.     [name], M.D., F.A.C.S. Gastrointestinal and Minimally Invasive Surgery [facility] [address], [place], [place] [address] [phone] Main / Paging    09/08/2017

## 2017-09-08 NOTE — Progress Notes (Signed)
Spoke with pt and husband at bedside at [facility] request. Pt not happy with any SNF bed offers. Pt states she would rather go home than go to any of the SNF's that offered a bed. Her first choice for DC would be to [facility], Her second choice is [facility] in a private room and third choice is home with [facility] home health services. CM will continue to follow. Marney Doctor RN,BSN,NCM [phone]

## 2017-09-08 NOTE — Progress Notes (Signed)
Subjective/Chief Complaint:  1 - Bladder Injury / Right Ureteral Stricture - s/p robotic RIGHT ureteral reimplant with psoas hitch + HM Flap 04/01/17 at time of colostomy take down / adhesiolysis, small bowel resection, loop iliostomy for right distal stricture / bladder injury sustained at pelvic resection for advanced endometrial cancer 2016. Stent removed 08/17/17. FU CT 08/25/17 with resolved right hydro.   2 - Metastatic Endometrial Cancer - s/p repeat resection [phone] with colon and vaginal involvement but negative nodes / margins. Had adjuvant chemo-XRT which she is now done with. Follows with Dr. Alvy Bimler.   3 - Acute on Chronic Renal Failure / Severe Dehydration - pt with high output iliostomy, difficult to maintain hydration, has been on PRN IVF. Cr 4's on admit late 08/2017 up from baseline <1.5. CT few days ago without hydro. Her BUN / Cr ratio has been 20 or more since iliostomy. Renal US1/29 with some mild (stable) caliectasis that is stable x months and no right hydro at all.  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   4 - Bacteruria / Malaise- pt with bacteruria on admit. No fevers / leukocytosis. Most recent clonal CX 07/2017 (when still had foley in place) serratia and enterococcus sens cipro. UCX 1/29 - serratia and treated with few days Cipro. Remains afebrile.   5 - Rectal - Bladder Fistula - some drainage of yellowiosh thin fluid from peri-rectal area with standing per report new late 08/2017. CT cystogram 1/30 confims NEW bladder neck - rectal fistula with large volume urine transit into GI tract distal to loop iliostomy. Out remained high even with foley drainage). Bilat nep tubes placed 09/02/17 with now approx 2/3 urine drianing per neph tuebs and 1/3 per foley and muc less per rectum.   PMH sig for DM2, morbid obesity, breast cancer, ovarian cancer,  endometrial cancer.    Today "[name]" is in somewhat better spirits, now awaiting SNF placement.    Objective: Vital signs in last 24 hours: Temp:  [98.6 F (37 C)-98.8 F (37.1 C)] 98.6 F (37 C) (02/07 0437) Pulse Rate:  [79-88] 79 (02/07 0437) Resp:  [16-17] 17 (02/07 0437) BP: (121-126)/(49-57) 121/52 (02/07 0437) SpO2:  [99 %] 99 % (02/07 0437) Weight:  [86.9 kg (191 lb 9.3 oz)] 86.9 kg (191 lb 9.3 oz) (02/07 0437) Last BM Date: 09/07/17  Intake/Output from previous day: 02/06 0701 - 02/07 0700 In: 635 [P.O.:300; I.V.:10] Out: 2875 [Urine:2125; Stool:750] Intake/Output this shift: Total I/O In: 360 [P.O.:360] Out: 825 [Urine:650; Stool:175]    General appearance: alert and with visible malaise and anxiety, husband at bedside as well.  Eyes: negative Nose: Nares normal. Septum midline. Mucosa normal. No drainage or sinus tenderness. Throat: lips, mucosa, and tongue normal; teeth and gums normal Neck: supple, symmetrical, trachea midline Back: symmetric, no curvature. ROM normal. No CVA tenderness. Bilat neph tubes in placed with clear yellos urine.  GI: stable large truncal obesity. Iliostomy patent of stool. Multiple scars w/o hernias.  Extremities: extremities normal, atraumatic, no cyanosis or edema Skin: Skin color, texture, turgor normal. No rashes or lesions Lymph nodes: Cervical, supraclavicular, and axillary nodes normal. Neurologic: Grossly normal FOley in place with yellow urine mixed with some mucus.  Lab Results:  Recent Labs    09/07/17 0324 09/08/17 0355  WBC 4.2 2.4*  HGB 8.2* 7.7*  HCT 26.1* 25.2*  PLT 321 333   BMET Recent  Labs    09/07/17 0324 09/08/17 0355  NA 131* 134*  K 3.7 3.8  CL 102 107  CO2 21* 20*  GLUCOSE 114* 150*  BUN 47* 51*  CREATININE 3.23* 2.88*  CALCIUM 8.5* 8.5*   PT/INR No results for input(s): LABPROT, INR in the last 72 hours. ABG No results for input(s): PHART, HCO3 in the last 72 hours.  Invalid  input(s): PCO2, PO2  Studies/Results: No results found.  Anti-infectives: Anti-infectives (From admission, onward)   Start     Dose/Rate Route Frequency Ordered Stop   08/30/17 1000  ciprofloxacin (CIPRO) IVPB 200 mg  Status:  Discontinued     200 mg 100 mL/hr over 60 Minutes Intravenous Every 24 hours 08/30/17 0855 09/06/17 0805      Assessment/Plan:  1 - Bladder Injury / Right Ureteral Stricture - imaging post-repair favorable, less right hydro than in years which suggests succesfull repair. Her mild left caliectasis is chronic and stable accounting for different imaging modalities.   2 - Metastatic Endometrial Cancer - no evidence of active disease, per med-oncology.   3 - Acute on Chronic Renal Failure / Severe Dehydration - her acute BUN and Cr rise appears to be mostly pre-renal + exacerbation / artifact from systemic reabsorption via contact with colon via fistula. This is improved but not back to baseline with drainage away from fistula.  Greatly appreciate nephrology comanagment.   4 - Bacteruria - now s/p tretment. Observe.    5 - Rectal - Bladder Fistula - improved diversion away from fistula with neph tubes and foley. All to remain for now as this making her hygeine and care managable.   Long term, she will likely need permanent GI and GU diversion when nutritionally replete, this will be months away.   Agree with likely SNF placement given level of deconditioning at anytime.   Will follow. She has GU FU arranged in 2 mos.   Please call me directly with questions.   [facility], Jekhi Bolin 09/08/2017

## 2017-09-08 NOTE — Progress Notes (Signed)
PROGRESS NOTE    [name]  LZJ:[id number] DOB: 1955/09/02 DOA: 08/29/2017 PCP: Ann Held, DO   Brief Narrative:   62 year old with a history of hyperlipidemia, diabetes mellitus type 2, hypothyroidism, idiopathic neutropenia, breast cancer status post bilateral mastectomy, ovarian and endometrial cancer status post colectomy in 2016 and takedown in August 2018 with ileocecal resection" and anal anastomosis and diverting loop ileostomy along with lysis of adhesion urethral stent exchange with psoas bladder hitch.  CT of her abdomen pelvis showed new colovesicular fistula therefore she was admitted and was also noted to have subacute renal failure.  Nephrology has been following.  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   Protein-calorie malnutrition, moderate (HCC)   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HCC)   Chronic kidney disease (CKD), stage III (moderate) (HCC)   Dehydration   ARF (acute renal failure) (HCC)   Adjustment disorder with mixed anxiety and depressed mood   Poorly controlled diabetes mellitus (HCC)   Pressure injury of skin   History of external beam radiation therapy to pelvis  Diabetes mellitus type 2 -Currently is well controlled on insulin -Continue linagliptin 5 mg daily, Lantus 25 units twice daily, aspart 7 units pre-meals, sliding scale -No further changes  Hypothyroidism -Continue  Synthroid  Colovesicular fistula -Management per urology and surgical team -Pain management per surgery  Acute kidney injury -This is improved, nephrology following.  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 Rectovesical fistula, as above. 2. 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 3. Reflux of contrast material into the right kidney via the right ureter with moderate atrophy of the right kidney. Nonobstructive calculi in the lower pole collecting system of the left kidney measuring up to 11 mm.  4. Additional incidental findings, as above. 08/30/2017 renal until ultrasound: IMPRESSION: 1. Small nonobstructive calculi in the collecting systems of both kidneys. 2. Mild to moderate hydronephrosis.    Subjective: Continues to report of abdominal pain especially in the right side pain is now well controlled.     Objective: Vitals:   09/07/17 1315 09/07/17 2208 09/08/17 0437 09/08/17 1437  BP: (!) 122/49 (!) 126/57 (!) 121/52 (!) 104/53  Pulse: 79 88 79 82  Resp: 17 16 17 18   Temp: 98.8 F (37.1 C) 98.6 F (37 C) 98.6 F (37 C) 98.4 F (36.9 C)  TempSrc: Oral Oral Oral Oral  SpO2:  99% 99% 99%  Weight:   86.9 kg (191 lb 9.3 oz)   Height:        Intake/Output Summary (Last 24 hours) at 09/08/2017 1654 Last data filed at 09/08/2017 1605 Gross per 24 hour  Intake 670 ml  Output 4350  ml  Net -3680 ml   Filed Weights   09/05/17 0500 09/06/17 0443 09/08/17 0437  Weight: 94.1 kg (207 lb 7.3 oz) 91.3 kg (201 lb 4.5 oz) 86.9 kg (191 lb 9.3 oz)     Examination:  General exam: Appears tearful yet calm, mild distress due to abdominal pain Respiratory system: Clear to auscultation. Respiratory effort normal. Cardiovascular system: S1 & S2 heard, RRR. No JVD, murmurs, rubs, gallops or clicks. No pedal edema. Gastrointestinal system: Abdomen is nondistended, soft and nontender. No organomegaly or masses felt. Normal bowel sounds heard.  Incisions felt site is dry and clean.  Ostomy site is clean and dry with intact Nephrostomy tubes are clean and intact as well Central nervous system: Alert and oriented. No focal neurological deficits. Extremities: Symmetric 5 x 5 power. Skin: No rashes, lesions or ulcers Psychiatry: Depressed and tearful    Data Reviewed:   CBC: Recent Labs  Lab 09/03/17 0455 09/04/17 0416 09/05/17 0433 09/07/17 0324 09/08/17 0355  WBC 4.4 13.6* 5.6 4.2 2.4*  NEUTROABS 3.3 11.8* 4.2  --   --   HGB 8.2* 8.6* 8.2* 8.2* 7.7*  HCT 24.9* 26.7* 25.0* 26.1* 25.2*  MCV 88.9 90.5 89.3 90.9 92.6  PLT 221 268 272 321 989   Basic Metabolic Panel: Recent Labs  Lab 09/04/17 0416 09/05/17 0433 09/06/17 0348 09/07/17 0324 09/08/17 0355  NA 132* 132* 131* 131* 134*  K 3.7 3.4* 3.7 3.7 3.8  CL 101 101 101 102 107  CO2 23 22 22  21* 20*  GLUCOSE 209* 239* 156* 114* 150*  BUN 55* 52* 48* 47* 51*  CREATININE 3.61* 3.00* 3.23* 3.23* 2.88*  CALCIUM 8.4* 8.5* 8.4* 8.5* 8.5*  MG 1.3* 2.0 1.4* 2.1 1.7  PHOS 3.8 3.4 3.5 4.2 5.1*   GFR: Estimated Creatinine Clearance: 20.5 mL/min (A) (by C-G formula based on SCr of 2.88 mg/dL (H)). Liver Function Tests: Recent Labs  Lab 09/04/17 0416 09/05/17 0433 09/06/17 0348 09/07/17 0324 09/08/17 0355  ALBUMIN 1.8* 1.8* 1.6* 1.9* 1.8*   No results for input(s): LIPASE, AMYLASE in the last 168 hours. No results for input(s): AMMONIA in the last 168 hours. Coagulation Profile: Recent Labs  Lab 09/02/17 0939  INR 1.21   Cardiac Enzymes: No results for input(s):  CKTOTAL, CKMB, CKMBINDEX, TROPONINI in the last 168 hours. BNP (last 3 results) No results for input(s): PROBNP in the last 8760 hours. HbA1C: No results for input(s): HGBA1C in the last 72 hours. CBG: Recent Labs  Lab 09/07/17 1752 09/07/17 2206 09/08/17 0715 09/08/17 1145 09/08/17 1600  GLUCAP 210* 142* 73 207* 89   Lipid Profile: No results for input(s): CHOL, HDL, LDLCALC, TRIG, CHOLHDL, LDLDIRECT in the last 72 hours. Thyroid Function Tests: No results for input(s): TSH, T4TOTAL, FREET4, T3FREE, THYROIDAB in the last 72 hours. Anemia Panel: No results for input(s): VITAMINB12, FOLATE, FERRITIN, TIBC, IRON, RETICCTPCT in the last 72 hours. Sepsis Labs: No results for input(s): PROCALCITON, LATICACIDVEN in the last 168 hours.  Recent Results (from the past 240 hour(s))  Culture, Urine     Status: Abnormal   Collection Time: 08/30/17 10:10 AM  Result Value Ref Range Status   Specimen Description URINE, RANDOM  Final   Special Requests NONE  Final   Culture (A)  Final    >=100,000 COLONIES/mL SERRATIA MARCESCENS 80,000 COLONIES/mL GROUP B STREP(S.AGALACTIAE)ISOLATED TESTING AGAINST S. AGALACTIAE NOT ROUTINELY PERFORMED DUE TO PREDICTABILITY OF AMP/PEN/VAN SUSCEPTIBILITY. Performed at [facility], [place],  [place] [address]    Report Status 09/01/2017 FINAL  Final   Organism ID, Bacteria SERRATIA MARCESCENS (A)  Final      Susceptibility   Serratia marcescens - MIC*    CEFAZOLIN >=64 RESISTANT Resistant     CEFTRIAXONE <=1 SENSITIVE Sensitive     CIPROFLOXACIN 1 SENSITIVE Sensitive     GENTAMICIN <=1 SENSITIVE Sensitive     NITROFURANTOIN 256 RESISTANT Resistant     TRIMETH/SULFA <=20 SENSITIVE Sensitive     * >=100,000 COLONIES/mL SERRATIA MARCESCENS  Culture, blood (Routine X 2) w Reflex to ID Panel     Status: None   Collection Time: 08/30/17  3:26 PM  Result Value Ref Range Status   Specimen Description   Final    BLOOD LEFT  HAND Performed at [facility], [place] [address] [place], [place] [address]    Special Requests   Final    IN PEDIATRIC BOTTLE Blood Culture adequate volume Performed at [facility] [address] [place], [place] [address]    Culture   Final    NO GROWTH 5 DAYS Performed at [facility], [place] [address] [place], [place] [address]    Report Status 09/04/2017 FINAL  Final  Culture, blood (Routine X 2) w Reflex to ID Panel     Status: None   Collection Time: 08/30/17  3:30 PM  Result Value Ref Range Status   Specimen Description   Final    BLOOD LEFT ANTECUBITAL Performed at [facility] [address], [place], [place] [address]    Special Requests   Final    IN PEDIATRIC BOTTLE Blood Culture adequate volume Performed at [facility] [address] [place], [place] [address]    Culture   Final    NO GROWTH 5 DAYS Performed at [facility], [place] [address] [place], [place] [address]    Report Status 09/04/2017 FINAL  Final  Culture, Urine     Status: Abnormal   Collection Time: 08/31/17  6:10 PM  Result Value Ref Range Status   Specimen Description   Final    URINE, CATHETERIZED Performed at [facility] [address] [place], [place] [address]    Special Requests   Final    NONE Performed at [facility], [place] [address], [place], [place] [address]    Culture >=100,000 COLONIES/mL SERRATIA MARCESCENS (A)  Final   Report Status 09/03/2017 FINAL  Final   Organism ID, Bacteria SERRATIA MARCESCENS (A)  Final      Susceptibility   Serratia marcescens - MIC*    CEFAZOLIN >=64 RESISTANT Resistant     CEFTRIAXONE <=1 SENSITIVE Sensitive     CIPROFLOXACIN 1 SENSITIVE Sensitive     GENTAMICIN <=1 SENSITIVE Sensitive     NITROFURANTOIN 256 RESISTANT Resistant     TRIMETH/SULFA <=20 SENSITIVE Sensitive     * >=100,000 COLONIES/mL SERRATIA MARCESCENS  Urine  Culture     Status: Abnormal   Collection Time: 09/02/17  5:07 PM  Result Value Ref Range Status   Specimen Description   Final    URINE, RANDOM KIDNEY RIGHT Performed at [facility] [address] [place], [place] [address]    Special Requests   Final    NONE Performed at [facility], [place] [address] [place],  [address]    Culture (A)  Final    40,000 COLONIES/mL SERRATIA MARCESCENS 20,000 COLONIES/mL  GROUP B STREP(S.AGALACTIAE)ISOLATED TESTING AGAINST S. AGALACTIAE NOT ROUTINELY PERFORMED DUE TO PREDICTABILITY OF AMP/PEN/VAN SUSCEPTIBILITY. >=100,000 COLONIES/mL LACTOBACILLUS SPECIES Standardized susceptibility testing for this organism is not available. 50,000 COLONIES/mL YEAST    Report Status 09/05/2017 FINAL  Final   Organism ID, Bacteria SERRATIA MARCESCENS (A)  Final      Susceptibility   Serratia marcescens - MIC*    CEFAZOLIN >=64 RESISTANT Resistant     CEFTRIAXONE <=1 SENSITIVE Sensitive     CIPROFLOXACIN 1 SENSITIVE Sensitive     GENTAMICIN <=1 SENSITIVE Sensitive     NITROFURANTOIN 256 RESISTANT Resistant     TRIMETH/SULFA <=20 SENSITIVE Sensitive     * 40,000 COLONIES/mL SERRATIA MARCESCENS  Urine Culture     Status: Abnormal   Collection Time: 09/02/17  5:07 PM  Result Value Ref Range Status   Specimen Description URINE, RANDOM  Final   Special Requests   Final    NONE Performed at [facility], [place] [address], [place], [place] [address]    Culture 80,000 COLONIES/mL SERRATIA MARCESCENS (A)  Final   Report Status 09/05/2017 FINAL  Final   Organism ID, Bacteria SERRATIA MARCESCENS (A)  Final      Susceptibility   Serratia marcescens - MIC*    CEFAZOLIN >=64 RESISTANT Resistant     CEFTRIAXONE <=1 SENSITIVE Sensitive     CIPROFLOXACIN 1 SENSITIVE Sensitive     GENTAMICIN <=1 SENSITIVE Sensitive     NITROFURANTOIN 256 RESISTANT Resistant     TRIMETH/SULFA <=20 SENSITIVE Sensitive     * 80,000  COLONIES/mL SERRATIA MARCESCENS         Radiology Studies: No results found.      Scheduled Meds: . acetaminophen  1,000 mg Oral TID  . anastrozole  1 mg Oral Daily  . artificial tears   Both Eyes QHS  . cholecalciferol  10,000 Units Oral Weekly  . enoxaparin (LOVENOX) injection  30 mg Subcutaneous Q24H  . famotidine  20 mg Oral Daily  . ferrous sulfate  325 mg Oral BID WC  . insulin aspart  0-20 Units Subcutaneous TID WC  . insulin aspart  0-5 Units Subcutaneous QHS  . insulin aspart  7 Units Subcutaneous TID WC  . insulin glargine  25 Units Subcutaneous BID  . levothyroxine  150 mcg Oral QAC breakfast  . linagliptin  5 mg Oral Daily  . lip balm  1 application Topical BID  . loratadine  10 mg Oral q morning - 10a  . methocarbamol  1,000 mg Oral QID  . metoCLOPramide  5 mg Oral TID AC  . polyvinyl alcohol  1 drop Both Eyes QHS  . pregabalin  75 mg Oral Daily  . prenatal multivitamin  1 tablet Oral Daily  . protein supplement shake  11 oz Oral BID BM  . psyllium  1 packet Oral Daily  . rosuvastatin  10 mg Oral QPM  . sertraline  50 mg Oral Daily  . Tbo-Filgrastim  480 mcg Subcutaneous Weekly   Continuous Infusions:   LOS: 10 days    Time spent: 35 mins     Yocelin Vanlue Arsenio Loader, MD [facility] Pager [phone]   If 7PM-7AM, please contact night-coverage [email or web address] Password [facility] 09/08/2017, 4:54 PM

## 2017-09-09 DIAGNOSIS — N179 Acute kidney failure, unspecified: Secondary | ICD-10-CM

## 2017-09-09 DIAGNOSIS — Z79899 Other long term (current) drug therapy: Secondary | ICD-10-CM

## 2017-09-09 DIAGNOSIS — Z85048 Personal history of other malignant neoplasm of rectum, rectosigmoid junction, and anus: Secondary | ICD-10-CM

## 2017-09-09 DIAGNOSIS — N183 Chronic kidney disease, stage 3 (moderate): Secondary | ICD-10-CM

## 2017-09-09 DIAGNOSIS — F321 Major depressive disorder, single episode, moderate: Secondary | ICD-10-CM

## 2017-09-09 DIAGNOSIS — F419 Anxiety disorder, unspecified: Secondary | ICD-10-CM

## 2017-09-09 LAB — RENAL FUNCTION PANEL
ANION GAP: 8 (ref 5–15)
Albumin: 1.8 g/dL — ABNORMAL LOW (ref 3.5–5.0)
BUN: 47 mg/dL — ABNORMAL HIGH (ref 6–20)
CALCIUM: 8.4 mg/dL — AB (ref 8.9–10.3)
CHLORIDE: 105 mmol/L (ref 101–111)
CO2: 18 mmol/L — AB (ref 22–32)
CREATININE: 2.65 mg/dL — AB (ref 0.44–1.00)
GFR, EST AFRICAN AMERICAN: 21 mL/min — AB (ref >60)
GFR, EST NON AFRICAN AMERICAN: 18 mL/min — AB (ref >60)
Glucose, Bld: 228 mg/dL — ABNORMAL HIGH (ref 65–99)
Phosphorus: 4.1 mg/dL (ref 2.5–4.6)
Potassium: 3.9 mmol/L (ref 3.5–5.1)
SODIUM: 131 mmol/L — AB (ref 135–145)

## 2017-09-09 LAB — GLUCOSE, CAPILLARY
GLUCOSE-CAPILLARY: 228 mg/dL — AB (ref 65–99)
GLUCOSE-CAPILLARY: 148 mg/dL — AB (ref 65–99)
GLUCOSE-CAPILLARY: 211 mg/dL — AB (ref 65–99)
GLUCOSE-CAPILLARY: 199 mg/dL — AB (ref 65–99)
Glucose-Capillary: 64 mg/dL — ABNORMAL LOW (ref 65–99)
Glucose-Capillary: 80 mg/dL (ref 65–99)

## 2017-09-09 LAB — MAGNESIUM: Magnesium: 1.3 mg/dL — ABNORMAL LOW (ref 1.7–2.4)

## 2017-09-09 MED ORDER — TBO-FILGRASTIM 480 MCG/0.8ML ~~LOC~~ SOSY
480.0000 ug | PREFILLED_SYRINGE | SUBCUTANEOUS | Status: DC
  Administered 2017-09-09 – 2017-09-16 (×2): 480 ug via SUBCUTANEOUS
  Filled 2017-09-09 (×2): qty 0.8

## 2017-09-09 MED ORDER — MAGNESIUM SULFATE 2 GM/50ML IV SOLN
2.0000 g | Freq: Once | INTRAVENOUS | Status: AC
  Administered 2017-09-09: 2 g via INTRAVENOUS
  Filled 2017-09-09: qty 50

## 2017-09-09 NOTE — Progress Notes (Signed)
Changed dressing on nephrostomy tubes today and the left tube incision site was very red.

## 2017-09-09 NOTE — Progress Notes (Signed)
Nutrition Follow-up  DOCUMENTATION CODES:   Obesity unspecified  INTERVENTION:   Continue Premier Protein BID, each supplement provides 160 kcal and 30 grams of protein.   NUTRITION DIAGNOSIS:   Increased nutrient needs related to post-op healing as evidenced by estimated needs.  Ongoing.  GOAL:   Patient will meet greater than or equal to 90% of their needs  Progressing.  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   Pt continues to eat, PO intakes are 100%. Pt consumed all of her breakfast this morning (provided 590 kcal, 26g protein). Pt on average is drinking one Premier Protein supplement daily. Pt consumed ~1350 kcal and 106g protein yesterday.  Pt's weight is +8 lb since 1/29 but is trending down from 2/2.  Medications: Ferrous sulfate tablet BID, Reglan tablet TID, Prenatal MVI daily Labs reviewed: CBGs: 148-211 Low Na, Mg Phos WNL GFR: 18  Diet Order:  Diet Carb Modified Fluid consistency: Thin; Room service appropriate? Yes  EDUCATION NEEDS:   Education needs have been addressed(Will provide handout at follow-up)  Skin:  Skin Assessment: Skin Integrity Issues: Skin Integrity Issues:: Stage I Stage I: buttocks  Last BM:  1/31  Height:   Ht Readings from Last 1 Encounters:  09/05/17 5\' 2"  (1.575 m)    Weight:   Wt Readings from Last 1 Encounters:  09/09/17 190 lb 0.6 oz (86.2 kg)    Ideal Body Weight:  52.3 kg  BMI:  Body mass index is 34.76 kg/m.  Estimated Nutritional Needs:   Kcal:  1650-1850  Protein:  65-75g  Fluid:  1.8L/day  Clayton Bibles, MS, RD, LDN [place] Pager: [phone] After Hours Pager: [phone]

## 2017-09-09 NOTE — Progress Notes (Signed)
Referring Physician(s): Manny,T  Supervising Physician: Corrie Mckusick  Patient Status:  [facility] - In-pt  Chief Complaint: Bladder leak   Subjective: Pt doing ok; has some flank discomfort at nephrostomy sites; depressed affect   Allergies: Penicillins; Ultram [tramadol]; Adhesive [tape]; Cefaclor; Erythromycin; Trimethoprim; Ciprofloxacin; Fluconazole; Oxycodone; Pectin; and Sulfa antibiotics  Medications: Prior to Admission medications   Medication Sig Start Date End Date Taking? Authorizing Provider  anastrozole (ARIMIDEX) 1 MG tablet TAKE 1 TABLET DAILY 12/08/16  Yes Gorsuch, Ni, MD  Biotin 5 MG TABS Take 5 mg by mouth every morning.    Yes [provider]  Cholecalciferol (VITAMIN D3) 10000 UNITS capsule Take 10,000 Units by mouth once a week. Sunday evening's   Yes [provider]  diphenoxylate-atropine (LOMOTIL) 2.5-0.025 MG tablet Take 1-2 tablets by mouth every 8 (eight) hours. TO PREVENT LOOSE BOWEL MOVEMENTS 05/20/17  Yes [provider]  Famotidine-Ca Carb-Mag Hydrox (PEPCID COMPLETE PO) Take 1 tablet by mouth daily as needed (INDIGESTION).   Yes [provider]  filgrastim (NEUPOGEN) 480 MCG/1.6ML injection Inject 1.6 ml under the skin every 5 days for life Patient taking differently: Inject 480 mcg into the skin See admin instructions. Inject 1.6 ml under the skin every 6 days for life 01/06/17  Yes Gorsuch, Ni, MD  levothyroxine (SYNTHROID, LEVOTHROID) 150 MCG tablet Take 150 mcg by mouth daily before breakfast.    Yes [provider]  loratadine (CLARITIN) 10 MG tablet Take 10 mg by mouth every morning.    Yes [provider]  omega-3 acid ethyl esters (LOVAZA) 1 G capsule Take 1 g by mouth 2 (two) times daily.    Yes [provider]  ondansetron (ZOFRAN-ODT) 4 MG disintegrating tablet Take 4 mg by mouth every 6 (six) hours as needed for nausea or vomiting.  04/12/17  Yes [provider]  Polyethyl  Glycol-Propyl Glycol (SYSTANE OP) Place 1 drop into both eyes at bedtime.    Yes [provider]  Prenatal Vit-Fe Fumarate-FA (PRENATAL VITAMIN PO) Take 1 capsule by mouth daily. Takes prenatal because there are no dyes in it   Yes [provider]  prochlorperazine (COMPAZINE) 10 MG tablet Take 1 tablet (10 mg total) by mouth 2 (two) times daily as needed for nausea or vomiting. Patient taking differently: Take 10 mg by mouth 2 (two) times daily as needed for nausea or vomiting.  05/28/17  Yes Virgel Manifold, MD  promethazine (PHENERGAN) 25 MG tablet Take 1 tablet (25 mg total) by mouth every 6 (six) hours as needed for nausea or vomiting. 05/28/17  Yes Virgel Manifold, MD  ranitidine (ZANTAC) 150 MG tablet Take 150 mg by mouth 2 (two) times daily as needed for heartburn.    Yes [provider]  rosuvastatin (CRESTOR) 10 MG tablet Take 10 mg by mouth every evening.    Yes [provider]  sitaGLIPtin (JANUVIA) 100 MG tablet Take 100 mg by mouth every morning.    Yes [provider]  HYDROcodone-acetaminophen (NORCO/VICODIN) 5-325 MG tablet Take 1 tablet by mouth every 6 (six) hours as needed (Pain). Patient not taking: Reported on 08/29/2017 08/10/17   Shelda Pal, DO     Vital Signs: BP (!) 93/37 (BP Location: Right Leg)   Pulse 83   Temp (!) 100.4 F (38 C) (Oral)   Resp 16   Ht 5' 2"  (1.575 m)   Wt 190 lb 0.6 oz (86.2 kg)   SpO2 97%   BMI 34.76  kg/m   Physical Exam bilat PCN's intact, some erythema note around left PCN, ?reaction to adhesive; both PCN's flush ok, outputs L- 550 cc, R- 175 cc yellow urine  Imaging: No results found.  Labs:  CBC: Recent Labs    09/04/17 0416 09/05/17 0433 09/07/17 0324 09/08/17 0355  WBC 13.6* 5.6 4.2 2.4*  HGB 8.6* 8.2* 8.2* 7.7*  HCT 26.7* 25.0* 26.1* 25.2*  PLT 268 272 321 333    COAGS: Recent Labs    04/16/17 0047 09/02/17 0939  INR 1.21 1.21    BMP: Recent Labs     09/06/17 0348 09/07/17 0324 09/08/17 0355 09/09/17 0457  NA 131* 131* 134* 131*  K 3.7 3.7 3.8 3.9  CL 101 102 107 105  CO2 22 21* 20* 18*  GLUCOSE 156* 114* 150* 228*  BUN 48* 47* 51* 47*  CALCIUM 8.4* 8.5* 8.5* 8.4*  CREATININE 3.23* 3.23* 2.88* 2.65*  GFRNONAA 14* 14* 16* 18*  GFRAA 16* 16* 19* 21*    LIVER FUNCTION TESTS: Recent Labs    04/20/17 0510 04/21/17 0416 05/27/17 1724 08/29/17 1550  09/06/17 0348 09/07/17 0324 09/08/17 0355 09/09/17 0457  BILITOT 0.4 0.5 0.3 0.5  --   --   --   --   --   AST 13* 13* 24 16  --   --   --   --   --   ALT 10* 10* 33 24  --   --   --   --   --   ALKPHOS 177* 190* 166* 173*  --   --   --   --   --   PROT 5.8* 5.9* 9.2* 6.8  --   --   --   --   --   ALBUMIN 1.8* 1.8* 3.9 2.1*   < > 1.6* 1.9* 1.8* 1.8*   < > = values in this interval not displayed.    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 Rowe Robert, PA-C 09/09/2017, 3:52 PM   I spent a total of 20 minutes at the the patient's bedside AND on the patient's hospital floor or unit, greater than 50% of which was counseling/coordinating care for bilateral nephrostomies    Patient ID: [name], female   DOB: 04/13/1956, 62 y.o.   MRN: [id number]

## 2017-09-09 NOTE — Consult Note (Signed)
[facility] Face-to-Face Psychiatry Consult   Reason for Consult:  Depression Referring Physician:  Dr. Johney Maine Patient Identification: [name]:  [id number] Principal Diagnosis: MDD (major depressive disorder), single episode, moderate ([place]) Diagnosis:   Patient Active Problem List   Diagnosis Date Noted  . Poorly controlled diabetes mellitus ([place]) [E11.65] 08/31/2017  . Pressure injury of skin [L89.90] 08/31/2017  . 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 History of external beam radiation therapy to pelvis [Z92.3] 08/31/2017  . ARF (acute renal failure) ([place]) [N17.9] 08/30/2017  . Adjustment disorder with mixed anxiety and depressed mood [F43.23] 08/30/2017  . Dehydration [E86.0] 08/29/2017  . Chronic kidney disease (CKD), stage III (moderate) ([place]) [N18.3] 08/24/2017  . Wound of right buttock [S31.819A] 08/10/2017  . 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[place]) [R19.8, Z93.2] 06/20/2017  . 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 Protein-calorie malnutrition, moderate ([place]) [E44.0] 04/18/2017  . Postoperative anemia [D64.9] 04/04/2017  . Pelvic cancer s/p colostomy takedown/loop ileostomy diversion 04/01/2017 [Z98.890] 04/01/2017  . 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 [Z93.2] 04/01/2017  . Hot flashes related to aromatase inhibitor therapy [R23.2, T45.1X5A] 03/18/2017  . Ureteral stricture, right, 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) 04/01/2017 [N13.5] 09/10/2016  . Vitamin D insufficiency [E55.9] 08/30/2016  . Breast cancer of upper-inner quadrant of left female breast ([place]) [C50.212]   . Pelvic pain in female [R10.2] 03/19/2015  . Chronic anemia [D64.9] 12/30/2014  . UTI (urinary tract infection) [N39.0]   . Pancytopenia, acquired [facility]) [T70.177] 11/26/2014  . Morbid obesity ([place]) [E66.01] 11/20/2014  . 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 s/p resection/partial vaginectomy/ LAR/colostomy 11/19/2014 [R19.00] 11/19/2014  .  Abdominal pain [R10.9] 09/09/2014  . Postmenopausal bleeding [N95.0] 09/05/2014  . History of ovarian & endometrial cancer [Z85.43] 07/17/2012  . 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[place]) [C56.9] 05/10/2012  . Chronic neutropenia ([place]) [D70.9] 12/14/2011  . Primary hypothyroidism [E03.9] 12/14/2011  . DM type 2 (diabetes mellitus, type 2) ([place]) [E11.9] 12/14/2011  . Benign essential HTN [I10] 12/14/2011    Total Time spent with patient: 1 hour  Subjective:   [name] is a 62 y.o. female patient admitted with acute renal failure on CKD stage 3 due to volume depletion.  HPI:  Per chart review, patient was admitted on 1/28 with acute renal failure with multiple medical comorbidities. PT recommends SNF placement. Psychiatry was consulted for management of depression. Patient is prescribed Zoloft 50 mg daily. It was started on 2/5.   On interview, [name] reports worsening mood over the past month. She has been dealing with the complications related to her current medical condition which has caused her to stay at home on most days. She is not used to being cared for since she has always been the caregiver. She was diagnosed with rectal cancer in 2006. She reports recovering well after surgery and radiation. She was later diagnosed with breast cancer and decided to have an elective bilateral mastectomy. Recently she has had complications from her colostomy reversal. She has frequent leakage which has affected her daily functioning. She sleeps poorly at night due to worrying about leakage. She reports an improvement in her appetite and energy level. She denies problems with concentration. She denies a history of manic symptoms (decreased need for sleep, increased energy or pressured speech). She denies SI, HI or AVH. She reports prior VH associated with sleep. She reports feeling like she wanted to give up in the past. She reports feeling able to speak to someone if she  feels like this again in the  future. She denies any problems with Zoloft.   Past Psychiatric History: Denies   Risk to Self: Is patient at risk for suicide?: No Risk to Others:  None. Denies HI. Prior Inpatient Therapy:  Denies  Prior Outpatient Therapy:  Denies   Past Medical History:  Past Medical History:  Diagnosis Date  . Adrenal adenoma, left 02/08/2016   CT: stable benign  . Anemia in neoplastic disease   . Benign essential HTN   . 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 Cancer of corpus uteri, except isthmus [facility])  oncologist-- dr Denman [name] and dr Alvy Bimler    10-15-2004  dx endometroid endometrial and ovarian cancer s/p  chemotheapy and surgery(TAH w/ BSO) :  recurrent 11-19-2014 post pelvic surgery and radiation 01-29-2015 to 03-10-2015  . Chronic idiopathic neutropenia (HCC)    presumed related to chemotherapy March 2006--- followed by dr Alvy Bimler (treatment w/ G-CSF injections  . Chronic nausea   . Chronic pain    perineal/ anal  area from bladder pad irritates skin , right flank pain  . CKD stage G2/A3, GFR 60-89 and albumin creatinine ratio >300 mg/g    nephrologist-  dr Madelon Lips  . Diabetic retinopathy, background ([place])   . Difficult intravenous access    small veins--- hx PICC lines  . DM type 2 (diabetes mellitus, type 2) ([place])    monitored by dr Legrand Como altheimer  . Dysuria   . Environmental and seasonal allergies   . Fatty liver 02/08/2016   CT  . Generalized muscle weakness   . GERD (gastroesophageal reflux disease)   . Hiatal hernia   . History of abdominal abscess 04/16/2017   post surgery 04-01-2017  --- resolved 10/ 2018  . History of gastric polyp    2014  duodenum  . History of ileus 04/16/2017   resolved w/ no surgical intervention  . History of radiation therapy    01-29-2015 to  03-10-2015  pelvis 50.4Gy  . Hypothyroidism    monitored by dr Legrand Como altheimer  . Ileostomy in place [facility]) 04/01/2017   created at same time colostomy takedown.  . Lower urinary tract symptoms (LUTS)    urge urinary  incontinence  . Mixed dyslipidemia   . Multiple thyroid nodules    Managed by Dr. Harlow Asa  . Pelvic abscess in female 04/16/2017  . PONV (postoperative nausea and vomiting)    "scopolamine patch works for me"  . Radiation-induced dermatitis    contact dermatitis , radiation completed, rash only on ankles now.  . Seasonal allergies   . Ureteral stricture, right UROLOGIT-  DR [facility]   CHRONIC--  TREATMENT URETERAL STENT  . Vitamin D deficiency   . Wears glasses     Past Surgical History:  Procedure Laterality Date  . APPENDECTOMY    . BREAST RECONSTRUCTION WITH PLACEMENT OF TISSUE EXPANDER AND FLEX HD (ACELLULAR HYDRATED DERMIS) Bilateral 12/11/2015   Procedure: BILATERAL BREAST RECONSTRUCTION WITH PLACEMENT OF TISSUE EXPANDERS;  Surgeon: Irene Limbo, MD;  Location: [place];  Service: Plastics;  Laterality: Bilateral;  . COLONOSCOPY WITH PROPOFOL N/A 08/21/2013   Procedure: COLONOSCOPY WITH PROPOFOL;  Surgeon: Cleotis Nipper, MD;  [facility];  Service: Endoscopy;  Laterality: N/A;  . COLOSTOMY TAKEDOWN N/A 12/04/2014   Procedure: LAPROSCOPIC LYSIS OF ADHESIONS,  SPLENIC MOBILIZATION, RELOCATION OF COLOSTOMY, DEBRIDEMENT INITIAL COLOSTOMY SITE;  Surgeon: Michael [name], MD;  [facility];  Service: General;  Laterality: N/A;  . CYSTOGRAM N/A 06/01/2017   Procedure: CYSTOGRAM;  Surgeon: Alexis Frock, MD;  [facility];  Service: Urology;  Laterality: N/A;  . CYSTOSCOPY W/ RETROGRADES Right 11/21/2015   Procedure: CYSTOSCOPY WITH RETROGRADE PYELOGRAM;  Surgeon: Alexis Frock, MD;  [facility];  Service: Urology;  Laterality: Right;  . CYSTOSCOPY W/ URETERAL STENT PLACEMENT Right 11/21/2015   Procedure: CYSTOSCOPY WITH STENT REPLACEMENT;  Surgeon:  Alexis Frock, MD;  [facility];  Service: Urology;  Laterality: Right;  . CYSTOSCOPY W/ URETERAL STENT PLACEMENT Right 03/10/2016   Procedure: CYSTOSCOPY WITH STENT REPLACEMENT;  Surgeon: Alexis Frock, MD;  Location: [facility];  Service: Urology;  Laterality: Right;  . CYSTOSCOPY W/ URETERAL STENT PLACEMENT Right 06/30/2016   Procedure: CYSTOSCOPY WITH RETROGRADE PYELOGRAM/URETERAL STENT EXCHANGE;  Surgeon: Alexis Frock, MD;  Location: [facility];  Service: Urology;  Laterality: Right;  . CYSTOSCOPY W/ URETERAL STENT PLACEMENT N/A 06/01/2017   Procedure: CYSTOSCOPY WITH EXAM UNDER ANESTHESIA;  Surgeon: Alexis Frock, MD;  [facility];  Service: Urology;  Laterality: N/A;  . CYSTOSCOPY W/ URETERAL STENT PLACEMENT Right 08/17/2017   Procedure: CYSTOSCOPY WITH RETROGRADE PYELOGRAM/URETERAL STENT REMOVAL;  Surgeon: Alexis Frock, MD;  Location[facility];  Service: Urology;  Laterality: Right;  . CYSTOSCOPY WITH RETROGRADE PYELOGRAM, URETEROSCOPY AND STENT PLACEMENT Right 03/20/2015   Procedure: CYSTOSCOPY WITH RETROGRADE PYELOGRAM, URETEROSCOPY WITH BALLOON DILATION AND STENT PLACEMENT ON RIGHT;  Surgeon: Alexis Frock, MD;  Location: [facility];  Service: Urology;  Laterality: Right;  . CYSTOSCOPY WITH RETROGRADE PYELOGRAM, URETEROSCOPY AND STENT PLACEMENT Right 05/02/2015   Procedure: CYSTOSCOPY WITH RIGHT RETROGRADE PYELOGRAM,  DIAGNOSTIC URETEROSCOPY AND STENT PULL ;  Surgeon: Alexis Frock, MD;  Location: [facility];  Service: Urology;  Laterality: Right;  . CYSTOSCOPY WITH RETROGRADE PYELOGRAM, URETEROSCOPY AND STENT PLACEMENT Right 09/05/2015   Procedure: CYSTOSCOPY WITH RETROGRADE PYELOGRAM,  AND STENT PLACEMENT;  Surgeon: Alexis Frock, MD;  [facility];  Service: Urology;  Laterality: Right;  . CYSTOSCOPY WITH RETROGRADE PYELOGRAM, URETEROSCOPY AND STENT PLACEMENT Right 04/01/2017   Procedure:  CYSTOSCOPY WITH RETROGRADE PYELOGRAM, URETEROSCOPY AND STENT PLACEMENT;  Surgeon: Alexis Frock, MD;  [facility];  Service: Urology;  Laterality: Right;  . CYSTOSCOPY WITH STENT PLACEMENT Right 10/27/2016   Procedure: CYSTOSCOPY WITH STENT CHANGE and right retrograde pyelogram;  Surgeon: Alexis Frock, MD;  Location: [facility];  Service: Urology;  Laterality: Right;  . EUS N/A 10/02/2014   Procedure: LOWER ENDOSCOPIC ULTRASOUND (EUS);  Surgeon: Arta Silence, MD;  [facility];  Service: Endoscopy;  Laterality: N/A;  . EXCISION SOFT TISSUE MASS RIGHT FOREMAN  12-08-2006  . EYE SURGERY  as child   pytosis of eyelids repair  . INCISION AND DRAINAGE OF WOUND Bilateral 12/26/2015   Procedure: DEBRIDEMENT OF BILATERAL MASTECTOMY FLAPS;  Surgeon: Irene Limbo, MD;  Location: [place];  Service: Plastics;  Laterality: Bilateral;  . IR CV LINE INJECTION  05/31/2017  . IR FLUORO GUIDE CV LINE LEFT  05/31/2017  . IR FLUORO GUIDE CV LINE RIGHT  04/06/2017  . IR FLUORO GUIDE CV MIDLINE PICC RIGHT  05/30/2017  . IR NEPHROSTOGRAM LEFT INITIAL PLACEMENT  09/02/2017  . IR NEPHROSTOGRAM RIGHT INITIAL PLACEMENT  09/02/2017  . IR RADIOLOGIST EVAL & MGMT  05/03/2017  . IR US  GUIDE VASC ACCESS LEFT  05/31/2017  . IR US GUIDE VASC ACCESS RIGHT  04/06/2017  . IR US GUIDE VASC ACCESS RIGHT  05/30/2017  . LAPAROSCOPIC CHOLECYSTECTOMY  1990  . LIPOSUCTION WITH LIPOFILLING Bilateral 04/16/2016   Procedure: LIPOSUCTION WITH LIPOFILLING TO BILATERAL CHEST;  Surgeon: Irene Limbo, MD;  Location: [place];  Service: Plastics;  Laterality: Bilateral;  . MASTECTOMY W/ SENTINEL NODE BIOPSY Bilateral 12/11/2015   Procedure: RIGHT PROPHYLACTIC MASTECTOMY, LEFT TOTAL MASTECTOMY WITH LEFT AXILLARY SENTINEL LYMPH NODE BIOPSY;  Surgeon: Stark Klein, MD;  Location: [place];  Service: General;  Laterality: Bilateral;  . OSTOMY N/A 11/19/2014   Procedure: OSTOMY;   Surgeon: Michael [name], MD;  [facility];  Service: General;  Laterality: N/A;  . PROCTOSCOPY N/A 04/01/2017   Procedure: RIDGE PROCTOSCOPY;  Surgeon: Michael [name], MD;  [facility];  Service: General;  Laterality: N/A;  . REMOVAL OF BILATERAL TISSUE EXPANDERS WITH PLACEMENT OF BILATERAL BREAST IMPLANTS Bilateral 04/16/2016   Procedure: REMOVAL OF BILATERAL TISSUE EXPANDERS WITH PLACEMENT OF BILATERAL BREAST IMPLANTS;  Surgeon: Irene Limbo, MD;  Location: [place];  Service: Plastics;  Laterality: Bilateral;  . ROBOTIC ASSISTED LAP VAGINAL HYSTERECTOMY N/A 11/19/2014   Procedure: ROBOTIC LYSIS OF ADHESIONS, CONVERTED TO LAPAROTOMY RADICAL UPPER VAGINECTOMY,LOW ANTERIOR BOWEL RESECTION, COLOSTOMY, BILATERAL URETERAL STENT PLACEMENT AND CYSTONOMY CLOSURE;  Surgeon: Everitt Amber, MD;  [facility];  Service: Gynecology;  Laterality: N/A;  . TISSUE EXPANDER FILLING Bilateral 12/26/2015   Procedure: EXPANSION OF BILATERAL CHEST TISSUE EXPANDERS (60 mL- Right; 75 mL- Left);  Surgeon: Irene Limbo, MD;  Location: [place];  Service: Plastics;  Laterality: Bilateral;  . TONSILLECTOMY    . TOTAL ABDOMINAL HYSTERECTOMY  March 2006   [facility]   and Bilateral Salpingoophorectomy/  staging for Ovarian cancer/  an  . XI ROBOTIC ASSISTED LOWER ANTERIOR RESECTION N/A 04/01/2017   Procedure: XI ROBOTIC VS LAPAROSCOPIC COLOSTOMY TAKEDOWN WITH LYSIS OF ADHESIONS.;  Surgeon: Michael [name], MD;  [facility];  Service: General;  Laterality: N/A;  ERAS PATHWAY   Family History:  Family History  Problem Relation Age of Onset  . Cancer Mother 66       stomach ca  . Hypertension Mother   . Cancer Father 67       prostate ca  . Diabetes Father   . Heart disease Father        CABG  . Breast cancer Maternal Aunt        dx in her 52s  . Lymphoma Paternal Aunt   . Brain cancer Paternal Grandfather   . Ovarian cancer Other   . Diabetes Sister   . Hypertension  Brother y-10  . Heart disease Brother        CABG  . Diabetes Brother    Family Psychiatric  History: Denies  Social History:  Social History   Substance and Sexual Activity  Alcohol Use Yes   Comment: rare social     Social History   Substance and Sexual Activity  Drug Use No    Social History   Socioeconomic History  . Marital status: Married    Spouse name: None  . Number of children: 1  . Years of education: None  . Highest education level: None  Social Needs  . Financial resource strain: None  . Food insecurity - worry: None  . Food insecurity - inability: None  . Transportation needs - medical: None  . Transportation needs -  non-medical: None  Occupational History  . Occupation: retired Therapist, sports from [employer]: [employer] Therapist, sports - retired  Tobacco Use  . Smoking status: Never Smoker  . Smokeless tobacco: Never Used  Substance and Sexual Activity  . Alcohol use: Yes    Comment: rare social  . Drug use: No  . Sexual activity: None  Other Topics Concern  . None  Social History Narrative   Exercise-- has not gotten back into it since cancer came back   Additional Social History: She lives at home with her husband and 2 cats. She has been married since 62 y/o. She has 1 daughter and 1 grandchild. She is a retired Marine scientist since 2016. She denies illicit substance, alcohol or tobacco use.     Allergies:   Allergies  Allergen Reactions  . Penicillins Swelling    Facial swelling/childhood allergy Has patient had a PCN reaction causing immediate rash, facial/tongue/throat swelling, SOB or lightheadedness with hypotension: Yes Has patient had a PCN reaction causing severe rash involving mucus membranes or skin necrosis: Yes Has patient had a PCN reaction that required hospitalization yes Has patient had a PCN reaction occurring within the last 10 years: No If all of the above answers are "NO", then may proceed with Cephalosporin use.   [name] Kitchen Ultram [Tramadol] Hives  . Adhesive  [Tape]     blisters  . Cefaclor Rash    Ceclor  . Erythromycin     Gastritis, abd cramps  . Trimethoprim Rash  . Ciprofloxacin Other (See Comments)    Unknown On Dr notes   . Fluconazole Rash  . Oxycodone     " I just feel weird"  . Pectin Rash    Pectin ring for stoma  . Sulfa Antibiotics Rash    Labs:  Results for orders placed or performed during the hospital encounter of 08/29/17 (from the past 48 hour(s))  Glucose, capillary     Status: Abnormal   Collection Time: 09/07/17  1:51 PM  Result Value Ref Range   Glucose-Capillary 227 (H) 65 - 99 mg/dL  Glucose, capillary     Status: Abnormal   Collection Time: 09/07/17  5:52 PM  Result Value Ref Range   Glucose-Capillary 210 (H) 65 - 99 mg/dL  Glucose, capillary     Status: Abnormal   Collection Time: 09/07/17 10:06 PM  Result Value Ref Range   Glucose-Capillary 142 (H) 65 - 99 mg/dL  Magnesium     Status: None   Collection Time: 09/08/17  3:55 AM  Result Value Ref Range   Magnesium 1.7 1.7 - 2.4 mg/dL    Comment: Performed at [facility], [place] [address] [place], [place] [address]  Renal function panel     Status: Abnormal   Collection Time: 09/08/17  3:55 AM  Result Value Ref Range   Sodium 134 (L) 135 - 145 mmol/L   Potassium 3.8 3.5 - 5.1 mmol/L   Chloride 107 101 - 111 mmol/L   CO2 20 (L) 22 - 32 mmol/L   Glucose, Bld 150 (H) 65 - 99 mg/dL   BUN 51 (H) 6 - 20 mg/dL   Creatinine, Ser 2.88 (H) 0.44 - 1.00 mg/dL   Calcium 8.5 (L) 8.9 - 10.3 mg/dL   Phosphorus 5.1 (H) 2.5 - 4.6 mg/dL   Albumin 1.8 (L) 3.5 - 5.0 g/dL   GFR calc non Af Amer 16 (L) >60 mL/min   GFR calc Af Amer 19 (L) >60 mL/min  Comment: (NOTE) The eGFR has been calculated using the CKD EPI equation. This calculation has not been validated in all clinical situations. eGFR's persistently <60 mL/min signify possible Chronic Kidney Disease.    Anion gap 7 5 - 15    Comment: Performed at [facility], [place] [address] [place], [place] [address]  CBC     Status: Abnormal   Collection Time: 09/08/17  3:55 AM  Result Value Ref Range   WBC 2.4 (L) 4.0 - 10.5 K/uL   RBC 2.72 (L) 3.87 - 5.11 MIL/uL   Hemoglobin 7.7 (L) 12.0 - 15.0 g/dL   HCT 25.2 (L) 36.0 - 46.0 %   MCV 92.6 78.0 - 100.0 fL   MCH 28.3 26.0 - 34.0 pg   MCHC 30.6 30.0 - 36.0 g/dL   RDW 15.3 11.5 - 15.5 %   Platelets 333 150 - 400 K/uL    Comment: Performed at [facility], [place] [address], [place], [place] [address]  Glucose, capillary     Status: None   Collection Time: 09/08/17  7:15 AM  Result Value Ref Range   Glucose-Capillary 73 65 - 99 mg/dL  Glucose, capillary     Status: Abnormal   Collection Time: 09/08/17 11:45 AM  Result Value Ref Range   Glucose-Capillary 207 (H) 65 - 99 mg/dL  Glucose, capillary     Status: None   Collection Time: 09/08/17  4:00 PM  Result Value Ref Range   Glucose-Capillary 89 65 - 99 mg/dL  Glucose, capillary     Status: Abnormal   Collection Time: 09/08/17  9:34 PM  Result Value Ref Range   Glucose-Capillary 219 (H) 65 - 99 mg/dL  Glucose, capillary     Status: Abnormal   Collection Time: 09/09/17  4:36 AM  Result Value Ref Range   Glucose-Capillary 228 (H) 65 - 99 mg/dL  Magnesium     Status: Abnormal   Collection Time: 09/09/17  4:57 AM  Result Value Ref Range   Magnesium 1.3 (L) 1.7 - 2.4 mg/dL    Comment: Performed at [facility], [place] [address], [place],  [address]  Renal function panel     Status: Abnormal   Collection Time: 09/09/17  4:57 AM  Result Value Ref Range   Sodium 131 (L) 135 - 145 mmol/L   Potassium 3.9 3.5 - 5.1 mmol/L   Chloride 105 101 - 111 mmol/L   CO2 18 (L) 22 - 32 mmol/L   Glucose, Bld 228 (H) 65 - 99 mg/dL   BUN 47 (H) 6 - 20 mg/dL   Creatinine, Ser 2.65 (H) 0.44 - 1.00 mg/dL   Calcium 8.4 (L) 8.9 - 10.3 mg/dL   Phosphorus 4.1 2.5 - 4.6 mg/dL   Albumin 1.8 (L) 3.5 - 5.0 g/dL   GFR calc non Af Amer 18 (L)  >60 mL/min   GFR calc Af Amer 21 (L) >60 mL/min    Comment: (NOTE) The eGFR has been calculated using the CKD EPI equation. This calculation has not been validated in all clinical situations. eGFR's persistently <60 mL/min signify possible Chronic Kidney Disease.    Anion gap 8 5 - 15    Comment: Performed at [facility], [place] [address], [place], [place] [address]  Glucose, capillary     Status: Abnormal   Collection Time: 09/09/17  8:09 AM  Result Value Ref Range   Glucose-Capillary 148 (H) 65 - 99 mg/dL  Glucose, capillary  Status: Abnormal   Collection Time: 09/09/17 12:01 PM  Result Value Ref Range   Glucose-Capillary 211 (H) 65 - 99 mg/dL    Current Facility-Administered Medications  Medication Dose Route Frequency Provider Last Rate Last Dose  . acetaminophen (TYLENOL) tablet 1,000 mg  1,000 mg Oral TID Michael [name], MD   1,000 mg at 09/09/17 1205  . anastrozole (ARIMIDEX) tablet 1 mg  1 mg Oral Daily Michael [name], MD   1 mg at 09/09/17 1205  . artificial tears (LACRILUBE) ophthalmic ointment   Both Eyes Ardeen Fillers, MD      . cholecalciferol (VITAMIN D) tablet 10,000 Units  10,000 Units Oral Weekly Michael [name], MD   10,000 Units at 09/04/17 1650  . enoxaparin (LOVENOX) injection 30 mg  30 mg Subcutaneous Q24H Allred, Darrell K, PA-C   30 mg at 09/08/17 2148  . famotidine (PEPCID) tablet 20 mg  20 mg Oral Daily Michael [name], MD   20 mg at 09/09/17 1204  . ferrous sulfate tablet 325 mg  325 mg Oral BID WC Michael [name], MD   325 mg at 09/09/17 0857  . guaiFENesin-dextromethorphan (ROBITUSSIN DM) 100-10 MG/5ML syrup 10 mL  10 mL Oral Q4H PRN Michael [name], MD   10 mL at 09/01/17 2135  . HYDROmorphone (DILAUDID) injection 0.5-2 mg  0.5-2 mg Intravenous Q2H PRN Michael [name], MD   2 mg at 09/09/17 1158  . HYDROmorphone (DILAUDID) tablet 2-4 mg  2-4 mg Oral Q4H PRN Michael [name], MD   4 mg at 09/08/17 2019  . insulin aspart (novoLOG) injection 0-20  Units  0-20 Units Subcutaneous TID WC Patrecia Pour, MD   7 Units at 09/09/17 1221  . insulin aspart (novoLOG) injection 0-5 Units  0-5 Units Subcutaneous QHS Patrecia Pour, MD   2 Units at 09/08/17 2206  . insulin aspart (novoLOG) injection 7 Units  7 Units Subcutaneous TID WC Patrecia Pour, MD   7 Units at 09/09/17 1220  . insulin glargine (LANTUS) injection 25 Units  25 Units Subcutaneous BID Patrecia Pour, MD   25 Units at 09/09/17 1205  . levothyroxine (SYNTHROID, LEVOTHROID) tablet 150 mcg  150 mcg Oral QAC breakfast Michael [name], MD   150 mcg at 09/09/17 0857  . linagliptin (TRADJENTA) tablet 5 mg  5 mg Oral Daily Michael [name], MD   5 mg at 09/09/17 1204  . lip balm (CARMEX) ointment 1 application  1 application Topical BID Michael [name], MD   1 application at 52/77/82 1000  . loratadine (CLARITIN) tablet 10 mg  10 mg Oral q morning - Lorenza Burton, MD   10 mg at 09/09/17 1204  . magnesium sulfate IVPB 2 g 50 mL  2 g Intravenous Once Amin, Ankit Chirag, MD      . methocarbamol (ROBAXIN) tablet 1,000 mg  1,000 mg Oral QID Michael [name], MD   1,000 mg at 09/09/17 1203  . metoCLOPramide (REGLAN) tablet 5 mg  5 mg Oral TID Maebelle Munroe, MD   5 mg at 09/09/17 1204  . polyvinyl alcohol (LIQUIFILM TEARS) 1.4 % ophthalmic solution 1 drop  1 drop Both Eyes Ardeen Fillers, MD   1 drop at 09/08/17 2151  . pregabalin (LYRICA) capsule 75 mg  75 mg Oral Daily Michael [name], MD   75 mg at 09/09/17 1205  . prenatal multivitamin tablet 1 tablet  1 tablet Oral Daily Michael [name], MD   1 tablet at 09/09/17 1206  . prochlorperazine (  COMPAZINE) injection 10 mg  10 mg Intravenous Q4H PRN Barton Dubois, MD   10 mg at 09/04/17 2225  . protein supplement (PREMIER PROTEIN) liquid  11 oz Oral BID BM Michael [name], MD   11 oz at 09/08/17 2024  . psyllium (HYDROCIL/METAMUCIL) packet 1 packet  1 packet Oral Daily Michael [name], MD   1 packet at 09/07/17 [phone]  . rosuvastatin (CRESTOR) tablet 10 mg  10 mg  Oral QPM Michael [name], MD   10 mg at 09/08/17 1813  . sertraline (ZOLOFT) tablet 50 mg  50 mg Oral Daily Michael [name], MD   50 mg at 09/09/17 1204  . sodium chloride flush (NS) 0.9 % injection 10-40 mL  10-40 mL Intracatheter PRN Michael [name], MD   10 mL at 09/08/17 0402  . Tbo-Filgrastim (GRANIX) injection 480 mcg  480 mcg Subcutaneous Weekly [name], MD   480 mcg at 09/03/17 1701    Musculoskeletal: Strength & Muscle Tone: decreased due to physical deconditioning.  Gait & Station: UTA since patient was lying in bed. Patient leans: N/A  Psychiatric Specialty Exam: Physical Exam  Nursing note and vitals reviewed. Constitutional: She appears well-developed and well-nourished.  HENT:  Head: Normocephalic and atraumatic.  Neck: Normal range of motion.  Respiratory: Effort normal.  Musculoskeletal: Normal range of motion.  Skin: No rash noted.  Psychiatric: Her speech is normal and behavior is normal. Judgment and thought content normal. Cognition and memory are normal. She exhibits a depressed mood.    Review of Systems  Constitutional: Positive for fever. Negative for chills.  Cardiovascular: Negative for chest pain.  Gastrointestinal: Negative for abdominal pain, nausea and vomiting.  Musculoskeletal:       Right side pain following recent fall.   Psychiatric/Behavioral: Positive for depression. Negative for hallucinations, substance abuse and suicidal ideas. The patient is nervous/anxious and has insomnia.     Blood pressure (!) 122/55, pulse 84, temperature (!) 101 F (38.3 C), temperature source Oral, resp. rate 16, height _0  (1.575 m), weight 86.2 kg (190 lb 0.6 oz), SpO2 97 %.Body mass index is 34.76 kg/m.  General Appearance: Fairly Groomed, middle aged, Caucasian female with long blonde hair, corrective lenses, wearing a hospital gown and lying in bed. NAD.   Eye Contact:  Good  Speech:  Clear and Coherent and Normal Rate  Volume:  Normal  Mood:  Depressed   Affect:  Congruent and Tearful  Thought Process:  Goal Directed and Linear  Orientation:  Full (Time, Place, and Person)  Thought Content:  Logical  Suicidal Thoughts:  No  Homicidal Thoughts:  No  Memory:  Immediate;   Good Recent;   Good Remote;   Good  Judgement:  Good  Insight:  Good  Psychomotor Activity:  Normal  Concentration:  Concentration: Good and Attention Span: Good  Recall:  Good  Fund of Knowledge:  Good  Language:  Good  Akathisia:  No  Handed:  Right  AIMS (if indicated):   N/A  Assets:  Communication Skills Desire for Improvement Financial Resources/Insurance Housing Intimacy Social Support  ADL's:  Intact  Cognition:  WNL  Sleep:   Poor   Assessment:  Dava Rensch is a 63 y.o. female who was admitted with acute renal failure on CKD stage 3 due to volume depletion. Patient reports a decline in her mood over the past month with poor sleep and worsening anxiety in the setting of ongoing medical problems. She denies current SI, HI or AVH. Recommend continuing Zoloft  for depression and anxiety. Recommend Melatonin for sleep and Trazodone if Melatonin is ineffective.    Treatment Plan Summary: -Continue Zoloft 50 mg daily for depression and anxiety. -Start Melatonin 3-6 mg qhs for insomnia. If ineffective, start Trazodone 50 mg qhs PRN.  -Patient plans to follow up with her PCP for medication management. If PCP unable to manage medications then please have unit SW provide patient with resources for local psychiatrists.  -Psychiatry will sign off patient at this time. Please consult psychiatry again as needed.   Disposition: No evidence of imminent risk to self or others at present.   Patient does not meet criteria for psychiatric inpatient admission.  Faythe Dingwall, DO 09/09/2017 12:58 PM

## 2017-09-09 NOTE — Progress Notes (Signed)
[address] [place], [place] [address] Phone: [phone]  FAX: [phone]      Gelsey Amyx [id number] 06/26/1956  CARE TEAM:  PCP: Ann Held, DO  Outpatient Care Team: Patient Care Team: Carollee Herter, Alferd Apa, DO as PCP - General (Family Medicine) Heath Lark, MD as Consulting Physician (Hematology and Oncology) Everitt Amber, MD as Consulting Physician (Obstetrics and Gynecology) Michael [name], MD as Consulting Physician (General Surgery) Alexis Frock, MD as Consulting Physician (Urology) Althe[place], Legrand Como, MD as Consulting Physician (Endocrinology) Katy Apo, MD as Consulting Physician (Ophthalmology) Irene Limbo, MD as Consulting Physician (Plastic Surgery) Madelon Lips, MD as Consulting Physician (Nephrology)  Inpatient Treatment Team: Treatment Team: Attending Provider: Damita Lack, MD; Consulting Physician: Alexis Frock, MD; Registered Nurse: Illene Regulus, RN; Technician: Reuel Derby, NT; Consulting Physician: Fatima Blank, MD; Consulting Physician: Michael [name], MD; Technician: Leda Quail, NT; Consulting Physician: Patrecia Pour, MD; Consulting Physician: Heath Lark, MD; Technician: Estrellita Ludwig, [place]; Rounding Team: Dorthy Cooler Radiology, MD; Consulting Physician: Edison Pace, Md, MD; Technician: Resa Miner, [place]; Technician: Sueanne Margarita, NT; Occupational Therapist: [name], OT; Registered Nurse: Rossie Muskrat, RN; Technician: Rosalia Hammers, NT   Problem List:   Principal Problem:   Colovesical fistula - delayed Active Problems:   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 s/p resection/partial vaginectomy/ LAR/colostomy 11/19/2014   Ureteral stricture, right, 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) 04/01/2017   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   Primary hypothyroidism   DM type 2  (diabetes mellitus, type 2) ([place])   History of ovarian & endometrial cancer   Morbid obesity ([place])   Pelvic cancer s/p colostomy takedown/loop ileostomy diversion 04/01/2017   Protein-calorie malnutrition, moderate (HCC)   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HCC)   Chronic kidney disease (CKD), stage III (moderate) (HCC)   Dehydration   ARF (acute renal failure) (HCC)   Adjustment disorder with mixed anxiety and depressed mood   Poorly controlled diabetes mellitus ([place])   Pressure injury of skin   History of external beam radiation therapy to pelvis  SURGERY 04/01/2017     POST-OPERATIVE DIAGNOSIS:   Recurrent endometrial cancer status post low anterior resection and partial pelvic exeneration  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   SURGEON:Brittiany Wiehe Gwynneth Aliment, MD- Primary (Dr Tresa Moore assist) PROCEDURE:  XI ROBOTIC LYSIS OF ADHESIONS x 5 hours XI ROBOTIC COLOSTOMY TAKEDOWN ROBOTIC SEWN COLOANAL ANASTOMOSIS Ileocecal resection with anastomosis Diverting loop ileostomy  SURGEON: Alexis Frock, MD - Primary (Dr Johney Maine assist) PROCEDURE:  Cystoscopy with R retrograde and stent exchange.  Robotic R ureteral reimplant with psoas bladder hitch.  Consults: urology   SURGERY 08/17/2017  DIAGNOSIS:  Right ureteral stricture, status post complex reconstruction.  PROCEDURES: 1. Cystoscopy with right retrograde pyelogram. 2. Removal of right ureteral stent.  PHYSICIAN:  Alexis Frock, MD        SPECIMENS:  Right ureteral stone for discard, inspected and intact.  FINDINGS: 1. Fusiform bladder shape consistent with prior psoas hitch. 2. Visibly patent ureteral-bladder anastomosis. 3. No evidence of hydronephrosis or narrowing with right retrograde     pyelogram.    Assessment  Struggling with pain control  Delayed bladder leak turned into colovesical fistula with preferential urinary drainage in decompressed diverted colon limb.  Plan:   Inadequate  pain control.  Oral Dilaudid.  Scheduled Robaxin.  Increased Tylenol.  Still needing IV pain meds  Hemoglobin low but stable  in pancytopenic pt.  Iron deficiency anemia with borderline hemoglobin.  Given another dose of IV dextran.  Retry oral iron.  She had difficulty absorbing and tolerating.  With urine leak better controlled, perhaps she can tolerate better now  Discussed with urology, Dr. Tresa Moore.  S/p percutaneous nephrostomy tubes to divert urine directly out of kidneys and out of the bladder that is draining into her neorectum/colon.  Still needing Foley catheter.  Still leaking urine output anus.  Somewhat improved.  Follow off Cipro  Lyrica for chronic pain .  Zoloft for depression.  Consider psychiatric evaluation  Diabetes better controlled.    Solid diet.  Bowel regimen.  Seems to be thickening up.    Ostomy care  Orthostatic vital signs.  No evidence of any dehydration nor active bleeding now  Continue neupogen injections for pancytopenia  VTE prophylaxis: SCDs, etc  Ambulate as tolerated to help recovery    Leak on the left lateral inferiolateral corner of the cystostomy Heineke-Mikulicz and psoas hitch needed for the bladder to reach the right ureter at that had the chronic severe distal stricture.  I am highly skeptical that a radiated bladder with a delayed leak will spontaneously heal.  She had internal Foley drainage, external surgical drainage, internal stenting drainage for a few months, yet this happened. Therefore, most likely will require surgical repair.  Standard of care usually on this situation is to resect colon & do a new distal anastomosis. Primarily repair the bladder & patch it.  However, there is no way I can redo anastomosis in a radiated pelvis with prior surgeries with prior anterior exoneration, radiation, 7-hour lysis adhesions and prior surgery when she was in better shape.  Only options I can see is to try & primarily patch the colon with  OMENTOPEXY, primarily repair the bladder with OMENTOPEXY, and hope it will heal.  I am skeptical that it will.  Most likely we will have to convert to an end colostomy to get the colon away from it and allow the bladder to be patched.  I do agree with Dr. Tresa Moore that she is rather malnourished and deconditioned at this point so would like to try and hold off on definitive repair if we can.   I suspect that her bladder is probably not salvageable and she may need a completion cystectomy with ileal conduit.  I will defer to Dr. Zettie Pho expertise with urology.  We will discuss that later once she is in better shape to actually tolerate a reoperation should it come to that (very likely).  Home health nursing seems to have failed.  Lack of outpatient ostomy clinic in town here making outpatient management challenging.   I strongly suspect she will need a SNF.  Patient more motivated to get up and move around.  She is still embarrassed in frustrated with persistent urine leak out anus.  But she is getting up some.  Work to try and transition to outpatient rehab with SNF next week.  Needs better pain control     35 minutes spent in review, evaluation, examination, counseling, and coordination of care.  More than 50% of that time was spent in counseling.  [name], M.D., F.A.C.S. Gastrointestinal and Minimally Invasive Surgery [facility] [address] [place], [place] [address] [phone] Main / Paging    09/09/2017    Subjective: (Chief complaint)  Crying in pain from right back nephrostomy tube site.  IV medication better controlling but still  needing.  Walked better.  Not orthostatic.  Tolerating food better.  Ostomy output better controlled.  Objective:  Vital signs:  Vitals:   09/08/17 2136 09/08/17 2336 09/09/17 0055 09/09/17 0600  BP: (!) 157/82 (!) 177/101 (!) 118/55 (!) 122/55  Pulse: 88  85 84  Resp: 17  18 16   Temp: 98.1 F (36.7  C)  99.9 F (37.7 C) 98.7 F (37.1 C)  TempSrc: Oral  Oral Oral  SpO2: 100%  97% 97%  Weight:    86.2 kg (190 lb 0.6 oz)  Height:        Last BM Date: 09/07/17  Intake/Output   Yesterday:  02/07 0701 - 02/08 0700 In: 720 [P.O.:720] Out: 3525 [Urine:2300; Stool:1225] This shift:  Total I/O In: 120 [P.O.:120] Out: 1650 [Urine:1250; Stool:400]  Bowel function:  Flatus: YES   BM:  YES.  Thick oatmeal consistency succus in ileostomy bag.  Drain: No drains.     Physical Exam:  General: Pt awake/alert/oriented x4 in moderate acute distress. Eyes: PERRL, normal EOM.  Sclera clear.  No icterus Neuro: CN II-XII intact w/o focal sensory/motor deficits. Lymph: No head/neck/groin lymphadenopathy Psych:  No delerium/psychosis/paranoia.  Crying but consolable HENT: Normocephalic, Mucus membranes moist.  No thrush Neck: Supple, No tracheal deviation Chest: No chest wall pain w good excursion CV:  Pulses intact.  Regular rhythm MS: Normal AROM mjr joints.  No obvious deformity.  Bilateral perc nephrostomy bags - R posterior sensitive but no cellultitis  Abdomen: Soft.  Nondistended.  No tenderness to palpation.  No evidence of peritonitis.  No incarcerated hernias.  No wounds.  Ileostomy pink with gas and succus in bag.      GU.  No purulent drainage or hematoma.  Foley in place with clear light yellow urine  Rectal deferred.  Last week had stricturing at coloanal anastomosis.  Had been finger dilated.  Held off on that today. Ext:   No deformity.  No mjr edema.  No cyanosis Skin: No petechiae / purpura  Results:   Labs: Results for orders placed or performed during the hospital encounter of 08/29/17 (from the past 48 hour(s))  Glucose, capillary     Status: None   Collection Time: 09/07/17  7:39 AM  Result Value Ref Range   Glucose-Capillary 92 65 - 99 mg/dL  Glucose, capillary     Status: Abnormal   Collection Time: 09/07/17  1:51 PM  Result Value Ref Range    Glucose-Capillary 227 (H) 65 - 99 mg/dL  Glucose, capillary     Status: Abnormal   Collection Time: 09/07/17  5:52 PM  Result Value Ref Range   Glucose-Capillary 210 (H) 65 - 99 mg/dL  Glucose, capillary     Status: Abnormal   Collection Time: 09/07/17 10:06 PM  Result Value Ref Range   Glucose-Capillary 142 (H) 65 - 99 mg/dL  Magnesium     Status: None   Collection Time: 09/08/17  3:55 AM  Result Value Ref Range   Magnesium 1.7 1.7 - 2.4 mg/dL    Comment: Performed at [facility], [place] [address] [place], [place] [address]  Renal function panel     Status: Abnormal   Collection Time: 09/08/17  3:55 AM  Result Value Ref Range   Sodium 134 (L) 135 - 145 mmol/L   Potassium 3.8 3.5 - 5.1 mmol/L   Chloride 107 101 - 111 mmol/L   CO2 20 (L) 22 - 32 mmol/L   Glucose, Bld 150 (H) 65 -  99 mg/dL   BUN 51 (H) 6 - 20 mg/dL   Creatinine, Ser 2.88 (H) 0.44 - 1.00 mg/dL   Calcium 8.5 (L) 8.9 - 10.3 mg/dL   Phosphorus 5.1 (H) 2.5 - 4.6 mg/dL   Albumin 1.8 (L) 3.5 - 5.0 g/dL   GFR calc non Af Amer 16 (L) >60 mL/min   GFR calc Af Amer 19 (L) >60 mL/min    Comment: (NOTE) The eGFR has been calculated using the CKD EPI equation. This calculation has not been validated in all clinical situations. eGFR's persistently <60 mL/min signify possible Chronic Kidney Disease.    Anion gap 7 5 - 15    Comment: Performed at [facility], [place] [address], [place], [place] [address]  CBC     Status: Abnormal   Collection Time: 09/08/17  3:55 AM  Result Value Ref Range   WBC 2.4 (L) 4.0 - 10.5 K/uL   RBC 2.72 (L) 3.87 - 5.11 MIL/uL   Hemoglobin 7.7 (L) 12.0 - 15.0 g/dL   HCT 25.2 (L) 36.0 - 46.0 %   MCV 92.6 78.0 - 100.0 fL   MCH 28.3 26.0 - 34.0 pg   MCHC 30.6 30.0 - 36.0 g/dL   RDW 15.3 11.5 - 15.5 %   Platelets 333 150 - 400 K/uL    Comment: Performed at [facility], [place] [address] [place], [place] [address]  Glucose, capillary     Status:  None   Collection Time: 09/08/17  7:15 AM  Result Value Ref Range   Glucose-Capillary 73 65 - 99 mg/dL  Glucose, capillary     Status: Abnormal   Collection Time: 09/08/17 11:45 AM  Result Value Ref Range   Glucose-Capillary 207 (H) 65 - 99 mg/dL  Glucose, capillary     Status: None   Collection Time: 09/08/17  4:00 PM  Result Value Ref Range   Glucose-Capillary 89 65 - 99 mg/dL  Glucose, capillary     Status: Abnormal   Collection Time: 09/08/17  9:34 PM  Result Value Ref Range   Glucose-Capillary 219 (H) 65 - 99 mg/dL  Glucose, capillary     Status: Abnormal   Collection Time: 09/09/17  4:36 AM  Result Value Ref Range   Glucose-Capillary 228 (H) 65 - 99 mg/dL  Magnesium     Status: Abnormal   Collection Time: 09/09/17  4:57 AM  Result Value Ref Range   Magnesium 1.3 (L) 1.7 - 2.4 mg/dL    Comment: Performed at [facility], [place] [address] [place], [place] [address]  Renal function panel     Status: Abnormal   Collection Time: 09/09/17  4:57 AM  Result Value Ref Range   Sodium 131 (L) 135 - 145 mmol/L   Potassium 3.9 3.5 - 5.1 mmol/L   Chloride 105 101 - 111 mmol/L   CO2 18 (L) 22 - 32 mmol/L   Glucose, Bld 228 (H) 65 - 99 mg/dL   BUN 47 (H) 6 - 20 mg/dL   Creatinine, Ser 2.65 (H) 0.44 - 1.00 mg/dL   Calcium 8.4 (L) 8.9 - 10.3 mg/dL   Phosphorus 4.1 2.5 - 4.6 mg/dL   Albumin 1.8 (L) 3.5 - 5.0 g/dL   GFR calc non Af Amer 18 (L) >60 mL/min   GFR calc Af Amer 21 (L) >60 mL/min    Comment: (NOTE) The eGFR has been calculated using the CKD EPI equation. This calculation has not been validated in all clinical  situations. eGFR's persistently <60 mL/min signify possible Chronic Kidney Disease.    Anion gap 8 5 - 15    Comment: Performed at [facility], [place] [address] [place], [place] [address]    Imaging / Studies: No results found.  Medications / Allergies: per chart  Antibiotics: Anti-infectives (From admission, onward)    Start     Dose/Rate Route Frequency Ordered Stop   08/30/17 1000  ciprofloxacin (CIPRO) IVPB 200 mg  Status:  Discontinued     200 mg 100 mL/hr over 60 Minutes Intravenous Every 24 hours 08/30/17 0855 09/06/17 0805        Note: Portions of this report may have been transcribed using voice recognition software. Every effort was made to ensure accuracy; however, inadvertent computerized transcription errors may be present.   Any transcriptional errors that result from this process are unintentional.     [name], M.D., F.A.C.S. Gastrointestinal and Minimally Invasive Surgery [facility] [address] [place], [place] [address] [phone] Main / Paging    09/09/2017

## 2017-09-09 NOTE — Consult Note (Signed)
[place] Nurse ostomy follow up Stoma type/location: RLQ ileostomy Stomal assessment:  Pouch not removed, intact, no leakage, producing brown stool, belt in use.  Patient and her spouse did not voice any concerns today.  Supplies present in room reviewed with patient and spouse. Additional supply of tampons requested.  Spoke with charge nurse and she will follow up on this.  Otherwise, they have the supplies they need.  Discussed POC with patient and bedside nurse.  Thanks Val Riles MSN, RN, CNS-BC, [employer]

## 2017-09-09 NOTE — Progress Notes (Signed)
PROGRESS NOTE    [name]  ZOX:[id number] DOB: 02-08-1956 DOA: 08/29/2017 PCP: Ann Held, DO   Brief Narrative:   62 year old with a history of hyperlipidemia, diabetes mellitus type 2, hypothyroidism, idiopathic neutropenia, breast cancer status post bilateral mastectomy, ovarian and endometrial cancer status post colectomy in 2016 and takedown in August 2018 with ileocecal resection" and anal anastomosis and diverting loop ileostomy along with lysis of adhesion urethral stent exchange with psoas bladder hitch.  CT of her abdomen pelvis showed new colovesicular fistula therefore she was admitted and was also noted to have subacute renal failure.  Nephrology has been following.  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 Little up and down but will not change any meds for now.  -Continue linagliptin 5 mg daily, Lantus 25 units twice daily, aspart 7 units pre-meals, sliding scale  Depression -currently on Zoloft and  Lyrica, Will consult Psych to assist with med rec and then follow up outpatient.   Hypothyroidism -Continue Synthroid  Hypomagnesemia -We will give mag sulfate 2 g IV  Colovesicular fistula -Management per urology and surgical team -Pain management per surgery  Acute kidney injury -This is improved, nephrology following.  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 Rectovesical fistula, as above. 2. 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 3. Reflux of contrast material into the right kidney via the right ureter with moderate atrophy of the right kidney. Nonobstructive calculi in the lower pole collecting system of the left kidney measuring up to 11 mm.  4. Additional incidental findings, as above. 08/30/2017 renal until ultrasound: IMPRESSION: 1. Small nonobstructive calculi in the collecting systems of both kidneys. 2. Mild to moderate hydronephrosis.    Subjective: States her pain is well controlled compared to yesterday.  She still gets more pain especially when moving around.   Objective: Vitals:   09/08/17 2336 09/09/17 0055 09/09/17 0600 09/09/17 1216  BP: (!) 177/101 (!) 118/55 (!) 122/55   Pulse:  85 84   Resp:  18 16   Temp:  99.9 F (37.7 C) 98.7 F (37.1 C) (!) 101 F (38.3 C)  TempSrc:  Oral Oral Oral  SpO2:  97% 97%   Weight:   86.2 kg (190 lb 0.6 oz)  Height:        Intake/Output Summary (Last 24 hours) at 09/09/2017 1226 Last data filed at 09/09/2017 1000 Gross per 24 hour  Intake 900 ml  Output 3425 ml  Net -2525 ml     Filed Weights   09/06/17 0443 09/08/17 0437 09/09/17 0600  Weight: 91.3 kg (201 lb 4.5 oz) 86.9 kg (191 lb 9.3 oz) 86.2 kg (190 lb 0.6 oz)    Examination:  General exam: Appears tearful yet calm, mild distress due to abdominal pain Respiratory system: Clear to auscultation. Respiratory effort normal. Cardiovascular system: S1 & S2 heard, RRR. No JVD, murmurs, rubs, gallops or clicks. No pedal edema. Gastrointestinal system: Abdomen is soft and nontender, ostomy site noted which is dry and intact.  Nephrostomy tubes in place Central nervous system: Alert and oriented. No focal neurological deficits. Extremities: Symmetric 5 x 5 power. Skin: No rashes, lesions or ulcers Psychiatry: Overall appears to depressed    Data Reviewed:   CBC: Recent Labs  Lab 09/03/17 0455 09/04/17 0416 09/05/17 0433 09/07/17 0324 09/08/17 0355  WBC 4.4 13.6* 5.6 4.2 2.4*  NEUTROABS 3.3 11.8* 4.2  --   --   HGB 8.2* 8.6* 8.2* 8.2* 7.7*  HCT 24.9* 26.7* 25.0* 26.1* 25.2*  MCV 88.9 90.5 89.3 90.9 92.6  PLT 221 268 272 321 101   Basic Metabolic Panel: Recent Labs  Lab 09/05/17 0433 09/06/17 0348 09/07/17 0324 09/08/17 0355 09/09/17 0457  NA 132* 131* 131* 134* 131*  K 3.4* 3.7 3.7 3.8 3.9  CL 101 101 102 107 105  CO2 22 22 21* 20* 18*  GLUCOSE 239* 156* 114* 150* 228*  BUN 52* 48* 47* 51* 47*  CREATININE 3.00* 3.23* 3.23* 2.88* 2.65*  CALCIUM 8.5* 8.4* 8.5* 8.5* 8.4*  MG 2.0 1.4* 2.1 1.7 1.3*  PHOS 3.4 3.5 4.2 5.1* 4.1   GFR: Estimated Creatinine Clearance: 22.1 mL/min (A) (by C-G formula based on SCr of 2.65 mg/dL (H)). Liver Function Tests: Recent Labs  Lab 09/05/17 0433 09/06/17 0348 09/07/17 0324 09/08/17 0355 09/09/17 0457  ALBUMIN 1.8* 1.6* 1.9* 1.8* 1.8*   No results for input(s): LIPASE, AMYLASE in the last 168 hours. No results for input(s): AMMONIA in the last 168 hours. Coagulation Profile: No results for input(s): INR, PROTIME in the last 168 hours. Cardiac  Enzymes: No results for input(s): CKTOTAL, CKMB, CKMBINDEX, TROPONINI in the last 168 hours. BNP (last 3 results) No results for input(s): PROBNP in the last 8760 hours. HbA1C: No results for input(s): HGBA1C in the last 72 hours. CBG: Recent Labs  Lab 09/08/17 1600 09/08/17 2134 09/09/17 0436 09/09/17 0809 09/09/17 1201  GLUCAP 89 219* 228* 148* 211*   Lipid Profile: No results for input(s): CHOL, HDL, LDLCALC, TRIG, CHOLHDL, LDLDIRECT in the last 72 hours. Thyroid Function Tests: No results for input(s): TSH, T4TOTAL, FREET4, T3FREE, THYROIDAB in the last 72 hours. Anemia Panel: No results for input(s): VITAMINB12, FOLATE, FERRITIN, TIBC, IRON, RETICCTPCT in the last 72 hours. Sepsis Labs: No results for input(s): PROCALCITON, LATICACIDVEN in the last 168 hours.  Recent Results (from the past 240 hour(s))  Culture, blood (Routine X 2) w Reflex to ID Panel     Status: None   Collection Time: 08/30/17  3:26 PM  Result Value Ref Range Status   Specimen Description   Final    BLOOD LEFT HAND Performed at [facility], [place] [address] [place], [place] [address]    Special Requests   Final  IN PEDIATRIC BOTTLE Blood Culture adequate volume Performed at [place] [address], [place], [place] [address]    Culture   Final    NO GROWTH 5 DAYS Performed at [facility], [place] [address], [place], [place] [address]    Report Status 09/04/2017 FINAL  Final  Culture, blood (Routine X 2) w Reflex to ID Panel     Status: None   Collection Time: 08/30/17  3:30 PM  Result Value Ref Range Status   Specimen Description   Final    BLOOD LEFT ANTECUBITAL Performed at [facility] [place] [address] [place], [place] [address]    Special Requests   Final    IN PEDIATRIC BOTTLE Blood Culture adequate volume Performed at [facility] [place] [address], [place], [place] [address]    Culture   Final    NO GROWTH 5  DAYS Performed at [facility], [place] [address] [place], [place] [address]    Report Status 09/04/2017 FINAL  Final  Culture, Urine     Status: Abnormal   Collection Time: 08/31/17  6:10 PM  Result Value Ref Range Status   Specimen Description   Final    URINE, CATHETERIZED Performed at [place] [address], [place], [place] [address]    Special Requests   Final    NONE Performed at [facility], [place] [address] [place], [place] [address]    Culture >=100,000 COLONIES/mL SERRATIA MARCESCENS (A)  Final   Report Status 09/03/2017 FINAL  Final   Organism ID, Bacteria SERRATIA MARCESCENS (A)  Final      Susceptibility   Serratia marcescens - MIC*    CEFAZOLIN >=64 RESISTANT Resistant     CEFTRIAXONE <=1 SENSITIVE Sensitive     CIPROFLOXACIN 1 SENSITIVE Sensitive     GENTAMICIN <=1 SENSITIVE Sensitive     NITROFURANTOIN 256 RESISTANT Resistant     TRIMETH/SULFA <=20 SENSITIVE Sensitive     * >=100,000 COLONIES/mL SERRATIA MARCESCENS  Urine Culture     Status: Abnormal   Collection Time: 09/02/17  5:07 PM  Result Value Ref Range Status   Specimen Description   Final    URINE, RANDOM KIDNEY RIGHT Performed at [place] [address] [place], [place] [address]    Special Requests   Final    NONE Performed at [facility], [place] [address], [place],  [address]    Culture (A)  Final    40,000 COLONIES/mL SERRATIA MARCESCENS 20,000 COLONIES/mL GROUP B STREP(S.AGALACTIAE)ISOLATED TESTING AGAINST S. AGALACTIAE NOT ROUTINELY PERFORMED DUE TO PREDICTABILITY OF AMP/PEN/VAN SUSCEPTIBILITY. >=100,000 COLONIES/mL LACTOBACILLUS SPECIES Standardized susceptibility testing for this organism is not available. 50,000 COLONIES/mL YEAST    Report Status 09/05/2017 FINAL  Final   Organism ID, Bacteria SERRATIA MARCESCENS (A)  Final      Susceptibility   Serratia marcescens - MIC*    CEFAZOLIN  >=64 RESISTANT Resistant     CEFTRIAXONE <=1 SENSITIVE Sensitive     CIPROFLOXACIN 1 SENSITIVE Sensitive     GENTAMICIN <=1 SENSITIVE Sensitive     NITROFURANTOIN 256 RESISTANT Resistant     TRIMETH/SULFA <=20 SENSITIVE Sensitive     * 40,000 COLONIES/mL SERRATIA MARCESCENS  Urine Culture     Status: Abnormal   Collection Time: 09/02/17  5:07 PM  Result Value Ref Range Status   Specimen Description URINE, RANDOM  Final   Special Requests   Final  NONE Performed at [facility], [place] [address], [place], [place] [address]    Culture 80,000 COLONIES/mL SERRATIA MARCESCENS (A)  Final   Report Status 09/05/2017 FINAL  Final   Organism ID, Bacteria SERRATIA MARCESCENS (A)  Final      Susceptibility   Serratia marcescens - MIC*    CEFAZOLIN >=64 RESISTANT Resistant     CEFTRIAXONE <=1 SENSITIVE Sensitive     CIPROFLOXACIN 1 SENSITIVE Sensitive     GENTAMICIN <=1 SENSITIVE Sensitive     NITROFURANTOIN 256 RESISTANT Resistant     TRIMETH/SULFA <=20 SENSITIVE Sensitive     * 80,000 COLONIES/mL SERRATIA MARCESCENS         Radiology Studies: No results found.      Scheduled Meds: . acetaminophen  1,000 mg Oral TID  . anastrozole  1 mg Oral Daily  . artificial tears   Both Eyes QHS  . cholecalciferol  10,000 Units Oral Weekly  . enoxaparin (LOVENOX) injection  30 mg Subcutaneous Q24H  . famotidine  20 mg Oral Daily  . ferrous sulfate  325 mg Oral BID WC  . insulin aspart  0-20 Units Subcutaneous TID WC  . insulin aspart  0-5 Units Subcutaneous QHS  . insulin aspart  7 Units Subcutaneous TID WC  . insulin glargine  25 Units Subcutaneous BID  . levothyroxine  150 mcg Oral QAC breakfast  . linagliptin  5 mg Oral Daily  . lip balm  1 application Topical BID  . loratadine  10 mg Oral q morning - 10a  . methocarbamol  1,000 mg Oral QID  . metoCLOPramide  5 mg Oral TID AC  . polyvinyl alcohol  1 drop Both Eyes QHS  . pregabalin  75 mg Oral Daily  . prenatal  multivitamin  1 tablet Oral Daily  . protein supplement shake  11 oz Oral BID BM  . psyllium  1 packet Oral Daily  . rosuvastatin  10 mg Oral QPM  . sertraline  50 mg Oral Daily  . Tbo-Filgrastim  480 mcg Subcutaneous Weekly   Continuous Infusions:   LOS: 11 days    Time spent: 35 mins     Veronica Fretz Arsenio Loader, MD [facility] Pager [phone]   If 7PM-7AM, please contact night-coverage [email or web address] Password [facility] 09/09/2017, 12:26 PM

## 2017-09-09 NOTE — Progress Notes (Signed)
Physical Therapy Treatment Patient Details Name: Kyanne Rials MRN: [id number] DOB: 1955/08/19 Today's Date: 09/09/2017    History of Present Illness Pt is a 62 year old female with hx of bilateral mastectomies for breast cancer, chronic neutropenia,  h/o ovarian and endometrial CA, h/o colostomy 2016, w/takedown/loop ileostomy diversion as well as resection of ureteral stricture w/ bladder hitch reimplantation all in 03/2017. Pt had her stent removed 08/17/17 and admitted 08/29/17 for acute renal failure.    PT Comments    Patient progressing with her confidence and strength though remains heavily reliant on walker with unsafe practices and continued risk for falls.  Still appropriate for SNF rehab for skilled PT and nursing care. Goals reviewed/extended this session.   Follow Up Recommendations  SNF     Equipment Recommendations  None recommended by PT    Recommendations for Other Services       Precautions / Restrictions Precautions Precautions: Fall Precaution Comments: Multiple lines (catheter, bil PCN tubes)    Mobility  Bed Mobility               General bed mobility comments: sitting EOB  Transfers Overall transfer level: Needs assistance Equipment used: Rolling walker (2 wheeled) Transfers: Sit to/from Stand Sit to Stand: Supervision;From elevated surface         General transfer comment: requested I elevate height of bed to ease transition into standing (fearful of leakage from ostomy)  Ambulation/Gait Ambulation/Gait assistance: Min guard;Supervision Ambulation Distance (Feet): 300 Feet Assistive device: Rolling walker (2 wheeled) Gait Pattern/deviations: Step-through pattern;Wide base of support;Trunk flexed;Decreased stride length     General Gait Details: keeps walker out too far in front and c/o arm fatigue   Stairs            Wheelchair Mobility    Modified Rankin (Stroke Patients Only)       Balance Overall balance assessment: Needs  assistance   Sitting balance-Leahy Scale: Good       Standing balance-Leahy Scale: Good Standing balance comment: able to stand and reach across the bed to obtain call button and to straighten pad on bed                            Cognition Arousal/Alertness: Awake/alert Behavior During Therapy: WFL for tasks assessed/performed Overall Cognitive Status: Within Functional Limits for tasks assessed                                 General Comments: pt is very emotional about current situation.       Exercises      General Comments        Pertinent Vitals/Pain Pain Score: 6  Pain Location: back at tube insertion sites Pain Descriptors / Indicators: Sore Pain Intervention(s): Monitored during session    Home Living                      Prior Function            PT Goals (current goals can now be found in the care plan section) Acute Rehab PT Goals Time For Goal Achievement: 09/23/17 Potential to Achieve Goals: Good Progress towards PT goals: Progressing toward goals    Frequency    Min 3X/week      PT Plan Current plan remains appropriate    Co-evaluation  AM-PAC PT "6 Clicks" Daily Activity  Outcome Measure  Difficulty turning over in bed (including adjusting bedclothes, sheets and blankets)?: A Little Difficulty moving from lying on back to sitting on the side of the bed? : A Little Difficulty sitting down on and standing up from a chair with arms (e.g., wheelchair, bedside commode, etc,.)?: A Little Help needed moving to and from a bed to chair (including a wheelchair)?: A Little Help needed walking in hospital room?: A Little Help needed climbing 3-5 steps with a railing? : A Lot 6 Click Score: 17    End of Session   Activity Tolerance: Patient limited by fatigue Patient left: in chair;with call bell/phone within reach         Time: 1602-1622 PT Time Calculation (min) (ACUTE ONLY): 20  min  Charges:  $Gait Training: 8-22 mins                    G CodesMagda Kiel, [place] 09/09/2017    Reginia Naas 09/09/2017, 4:41 PM

## 2017-09-10 ENCOUNTER — Inpatient Hospital Stay (HOSPITAL_COMMUNITY): Payer: BLUE CROSS/BLUE SHIELD

## 2017-09-10 LAB — GLUCOSE, CAPILLARY
GLUCOSE-CAPILLARY: 76 mg/dL (ref 65–99)
GLUCOSE-CAPILLARY: 166 mg/dL — AB (ref 65–99)
Glucose-Capillary: 124 mg/dL — ABNORMAL HIGH (ref 65–99)
Glucose-Capillary: 159 mg/dL — ABNORMAL HIGH (ref 65–99)

## 2017-09-10 MED ORDER — TRAZODONE HCL 50 MG PO TABS
50.0000 mg | ORAL_TABLET | Freq: Every evening | ORAL | Status: DC | PRN
  Administered 2017-09-19: 50 mg via ORAL
  Filled 2017-09-10 (×3): qty 1

## 2017-09-10 NOTE — Progress Notes (Signed)
General Surgery [facility]  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   Primary hypothyroidism   DM type 2 (diabetes mellitus, type 2) ([place])   History of ovarian & endometrial cancer   Morbid obesity ([place])   Pelvic cancer s/p colostomy takedown/loop ileostomy diversion 04/01/2017   Protein-calorie malnutrition, moderate (HCC)   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HCC)   Chronic kidney disease (CKD), stage III (moderate) (HCC)   Dehydration   ARF (acute renal failure) (HCC)   Adjustment disorder with mixed anxiety and depressed mood   Poorly controlled diabetes mellitus ([place])   Pressure injury of skin   History of external beam radiation therapy to pelvis  See detailed progress note from Dr. Johney Maine yesterday.  Awaiting SNF placement this week.  Will follow.        [name], MD, [facility]       Office: [phone]    Chief Complaint: Endometrial cancer, colovesical fistula  Subjective: Patient in bed having breakfast.  No complaints.  Working with IS.  Fever yesterday.  Objective: Vital signs in last 24 hours: Temp:  [99.2 F (37.3 C)-101 F (38.3 C)] 99.6 F (37.6 C) (02/09 0540) Pulse Rate:  [80-90] 80 (02/09 0540) Resp:  [16] 16 (02/09 0540) BP: (93-145)/(37-53) 123/45 (02/09 0540) SpO2:  [97 %-98 %] 98 % (02/09 0540) Weight:  [86.1 kg (189 lb 13.1 oz)] 86.1 kg (189 lb 13.1 oz) (02/09 0540) Last BM Date: 09/10/17  Intake/Output from previous day: 02/08 0701 - 02/09 0700 In: 1645 [P.O.:1460; I.V.:10] Out: 4425 [Urine:3175; Stool:1250] Intake/Output this shift: Total I/O In: -  Out: 950 [Urine:600;  Stool:350]  Physical Exam: HEENT - sclerae clear, mucous membranes moist Neck - soft Abdomen - soft, obese; ileostomy with moderately thick output in bag; soft, non-tender Neuro - alert & oriented, no focal deficits  Lab Results:  Recent Labs    09/08/17 0355  WBC 2.4*  HGB 7.7*  HCT 25.2*  PLT 333   BMET Recent Labs    09/08/17 0355 09/09/17 0457  NA 134* 131*  K 3.8 3.9  CL 107 105  CO2 20* 18*  GLUCOSE 150* 228*  BUN 51* 47*  CREATININE 2.88* 2.65*  CALCIUM 8.5* 8.4*   PT/INR No results for input(s): LABPROT, INR in the last 72 hours. Comprehensive Metabolic Panel:    Component Value Date/Time   NA 131 (L) 09/09/2017 0457   NA 134 (L) 09/08/2017 0355   NA 141 01/24/2017 1228   NA 141 10/07/2016 0929   K 3.9 09/09/2017 0457   K 3.8 09/08/2017 0355   K 4.6 01/24/2017 1228   K 4.5 10/07/2016 0929   CL 105 09/09/2017 0457   CL 107 09/08/2017 0355   CO2 18 (L) 09/09/2017 0457   CO2 20 (L) 09/08/2017 0355   CO2 29 01/24/2017 1228   CO2 30 (H) 10/07/2016 0929   BUN 47 (H) 09/09/2017 0457   BUN 51 (H) 09/08/2017 0355   BUN 22.2 01/24/2017 1228   BUN 23.6 10/07/2016 0929   CREATININE 2.65 (H) 09/09/2017 0457   CREATININE 2.88 (H) 09/08/2017 0355   CREATININE 0.8 01/24/2017 1228   CREATININE 1.0 10/07/2016 0929   GLUCOSE  228 (H) 09/09/2017 0457   GLUCOSE 150 (H) 09/08/2017 0355   GLUCOSE 84 01/24/2017 1228   GLUCOSE 108 10/07/2016 0929   CALCIUM 8.4 (L) 09/09/2017 0457   CALCIUM 8.5 (L) 09/08/2017 0355   CALCIUM 10.2 01/24/2017 1228   CALCIUM 10.7 (H) 10/07/2016 0929   AST 16 08/29/2017 1550   AST 24 05/27/2017 1724   AST 16 01/24/2017 1228   AST 15 10/07/2016 0929   ALT 24 08/29/2017 1550   ALT 33 05/27/2017 1724   ALT 14 01/24/2017 1228   ALT 16 10/07/2016 0929   ALKPHOS 173 (H) 08/29/2017 1550   ALKPHOS 166 (H) 05/27/2017 1724   ALKPHOS 90 01/24/2017 1228   ALKPHOS 123 10/07/2016 0929   BILITOT 0.5 08/29/2017 1550   BILITOT 0.3 05/27/2017  1724   BILITOT 0.34 01/24/2017 1228   BILITOT 0.31 10/07/2016 0929   PROT 6.8 08/29/2017 1550   PROT 9.2 (H) 05/27/2017 1724   PROT 6.9 01/24/2017 1228   PROT 8.2 10/07/2016 0929   ALBUMIN 1.8 (L) 09/09/2017 0457   ALBUMIN 1.8 (L) 09/08/2017 0355   ALBUMIN 3.4 (L) 01/24/2017 1228   ALBUMIN 3.8 10/07/2016 0929    Studies/Results: No results found.    [place] M 09/10/2017  Patient ID: Doretha Imus, female   DOB: [birth year], 62 y.o.   MRN: [id number]

## 2017-09-10 NOTE — Progress Notes (Signed)
PROGRESS NOTE    [name]  NID:[id number] DOB: [birth year] DOA: 08/29/2017 PCP: Ann Held, DO   Brief Narrative:   62 year old with a history of hyperlipidemia, diabetes mellitus type 2, hypothyroidism, idiopathic neutropenia, breast cancer status post bilateral mastectomy, ovarian and endometrial cancer status post colectomy in 2016 and takedown in August 2018 with ileocecal resection" and anal anastomosis and diverting loop ileostomy along with lysis of adhesion urethral stent exchange with psoas bladder hitch.  CT of her abdomen pelvis showed new colovesicular fistula therefore she was admitted and was also noted to have subacute renal failure.  Nephrology has been following.  Medicine team consulted for diabetes management  Assessment & Plan:   Principal Problem:   MDD (major depressive disorder), single episode, moderate (HCC) Active Problems:   Primary hypothyroidism   DM type 2 (diabetes mellitus, type 2) ([place])   History of ovarian & endometrial cancer   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 s/p resection/partial vaginectomy/ LAR/colostomy 11/19/2014   Morbid obesity (HCC)   Ureteral stricture, right, 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) 04/01/2017   Pelvic cancer s/p colostomy takedown/loop ileostomy diversion 04/01/2017   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  We will not make any changes today.  Off and on its labile.  Closely monitor for signs for hypoglycemia. -Continue linagliptin  5 mg daily, Lantus 25 units twice daily, aspart 7 units pre-meals, sliding scale  Depression -currently on Zoloft and Lyrica, appreciate psychiatry input -Continue Zoloft -Trazodone added as needed at night.  Hypothyroidism -Continue Synthroid  Hypomagnesemia -We will give mag sulfate 2 g IV  Colovesicular fistula -Management per urology and surgical team -Pain management per surgery  Acute kidney injury -This is improved, nephrology following.  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 Rectovesical fistula, as above. 2. 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 3. Reflux of contrast material into the right kidney via the right ureter with moderate atrophy of the right kidney. Nonobstructive calculi in the lower pole collecting system of the left kidney measuring up to 11 mm.  4. Additional incidental findings, as above. 08/30/2017 renal until ultrasound: IMPRESSION: 1. Small nonobstructive calculi in the collecting systems of both kidneys. 2. Mild to moderate hydronephrosis.    Subjective: No acute events overnight.  Tolerating diet.   Objective: Vitals:   09/09/17 1422 09/09/17 1800 09/09/17 2104 09/10/17 0540  BP: (!) 93/37  (!) 145/53 (!) 123/45  Pulse: 83  90 80  Resp: 16  16 16   Temp: (!) 100.4 F (38 C) 99.4 F (37.4 C) 99.2 F (37.3 C) 99.6 F (37.6 C)  TempSrc: Oral Oral Oral Oral  SpO2:   97% 98%  Weight:    86.1  kg (189 lb 13.1 oz)  Height:        Intake/Output Summary (Last 24 hours) at 09/10/2017 1217 Last data filed  at 09/10/2017 1058 Gross per 24 hour  Intake 1645 ml  Output 5255 ml  Net -3610 ml   Filed Weights   09/08/17 0437 09/09/17 0600 09/10/17 0540  Weight: 86.9 kg (191 lb 9.3 oz) 86.2 kg (190 lb 0.6 oz) 86.1 kg (189 lb 13.1 oz)    Examination:  General exam: Appears tearful yet calm, mild distress due to abdominal pain Respiratory system: Clear to auscultation. Respiratory effort normal. Cardiovascular system: S1 & S2 heard, RRR. No JVD, murmurs, rubs, gallops or clicks. No pedal edema. Gastrointestinal system: Abdomen is soft and nontender, ostomy site noted which is dry and intact.  Nephrostomy tubes are in place with clear drainage Central nervous system: Alert and oriented. No focal neurological deficits. Extremities: Symmetric 5 x 5 power. Skin: No rashes, lesions or ulcers Psychiatry: Overall appears to depressed    Data Reviewed:   CBC: Recent Labs  Lab 09/04/17 0416 09/05/17 0433 09/07/17 0324 09/08/17 0355  WBC 13.6* 5.6 4.2 2.4*  NEUTROABS 11.8* 4.2  --   --   HGB 8.6* 8.2* 8.2* 7.7*  HCT 26.7* 25.0* 26.1* 25.2*  MCV 90.5 89.3 90.9 92.6  PLT 268 272 321 878   Basic Metabolic Panel: Recent Labs  Lab 09/05/17 0433 09/06/17 0348 09/07/17 0324 09/08/17 0355 09/09/17 0457  NA 132* 131* 131* 134* 131*  K 3.4* 3.7 3.7 3.8 3.9  CL 101 101 102 107 105  CO2 22 22 21* 20* 18*  GLUCOSE 239* 156* 114* 150* 228*  BUN 52* 48* 47* 51* 47*  CREATININE 3.00* 3.23* 3.23* 2.88* 2.65*  CALCIUM 8.5* 8.4* 8.5* 8.5* 8.4*  MG 2.0 1.4* 2.1 1.7 1.3*  PHOS 3.4 3.5 4.2 5.1* 4.1   GFR: Estimated Creatinine Clearance: 22.1 mL/min (A) (by C-G formula based on SCr of 2.65 mg/dL (H)). Liver Function Tests: Recent Labs  Lab 09/05/17 0433 09/06/17 0348 09/07/17 0324 09/08/17 0355 09/09/17 0457  ALBUMIN 1.8* 1.6* 1.9* 1.8* 1.8*   No results for input(s): LIPASE, AMYLASE in the last 168 hours. No results for input(s): AMMONIA in the last 168 hours. Coagulation Profile: No  results for input(s): INR, PROTIME in the last 168 hours. Cardiac Enzymes: No results for input(s): CKTOTAL, CKMB, CKMBINDEX, TROPONINI in the last 168 hours. BNP (last 3 results) No results for input(s): PROBNP in the last 8760 hours. HbA1C: No results for input(s): HGBA1C in the last 72 hours. CBG: Recent Labs  Lab 09/09/17 1201 09/09/17 1642 09/09/17 1645 09/09/17 2133 09/10/17 0745  GLUCAP 211* 64* 80 199* 76   Lipid Profile: No results for input(s): CHOL, HDL, LDLCALC, TRIG, CHOLHDL, LDLDIRECT in the last 72 hours. Thyroid Function Tests: No results for input(s): TSH, T4TOTAL, FREET4, T3FREE, THYROIDAB in the last 72 hours. Anemia Panel: No results for input(s): VITAMINB12, FOLATE, FERRITIN, TIBC, IRON, RETICCTPCT in the last 72 hours. Sepsis Labs: No results for input(s): PROCALCITON, LATICACIDVEN in the last 168 hours.  Recent Results (from the past 240 hour(s))  Culture, Urine     Status: Abnormal   Collection Time: 08/31/17  6:10 PM  Result Value Ref Range Status   Specimen Description   Final    URINE, CATHETERIZED Performed at [facility] [address] [place], [place] [address]    Special Requests   Final    NONE Performed at [facility], [place] [address], [place], [place] [address]    Culture >=100,000 COLONIES/mL SERRATIA MARCESCENS (A)  Final   Report Status 09/03/2017 FINAL  Final   Organism ID, Bacteria SERRATIA MARCESCENS (A)  Final      Susceptibility   Serratia marcescens - MIC*    CEFAZOLIN >=64 RESISTANT Resistant     CEFTRIAXONE <=1 SENSITIVE Sensitive     CIPROFLOXACIN 1 SENSITIVE Sensitive     GENTAMICIN <=1 SENSITIVE Sensitive     NITROFURANTOIN 256 RESISTANT Resistant     TRIMETH/SULFA <=20 SENSITIVE Sensitive     * >=100,000 COLONIES/mL SERRATIA MARCESCENS  Urine Culture     Status: Abnormal   Collection Time: 09/02/17  5:07 PM  Result Value Ref Range Status   Specimen Description   Final     URINE, RANDOM KIDNEY RIGHT Performed at [facility] [address], [place], [place] [address]    Special Requests   Final    NONE Performed at [facility] [place] [address], [place], [place] [address]    Culture (A)  Final    40,000 COLONIES/mL SERRATIA MARCESCENS 20,000 COLONIES/mL GROUP B STREP(S.AGALACTIAE)ISOLATED TESTING AGAINST S. AGALACTIAE NOT ROUTINELY PERFORMED DUE TO PREDICTABILITY OF AMP/PEN/VAN SUSCEPTIBILITY. >=100,000 COLONIES/mL LACTOBACILLUS SPECIES Standardized susceptibility testing for this organism is not available. 50,000 COLONIES/mL YEAST    Report Status 09/05/2017 FINAL  Final   Organism ID, Bacteria SERRATIA MARCESCENS (A)  Final      Susceptibility   Serratia marcescens - MIC*    CEFAZOLIN >=64 RESISTANT Resistant     CEFTRIAXONE <=1 SENSITIVE Sensitive     CIPROFLOXACIN 1 SENSITIVE Sensitive     GENTAMICIN <=1 SENSITIVE Sensitive     NITROFURANTOIN 256 RESISTANT Resistant     TRIMETH/SULFA <=20 SENSITIVE Sensitive     * 40,000 COLONIES/mL SERRATIA MARCESCENS  Urine Culture     Status: Abnormal   Collection Time: 09/02/17  5:07 PM  Result Value Ref Range Status   Specimen Description URINE, RANDOM  Final   Special Requests   Final    NONE Performed at [facility], [place] [address], [place], [place] [address]    Culture 80,000 COLONIES/mL SERRATIA MARCESCENS (A)  Final   Report Status 09/05/2017 FINAL  Final   Organism ID, Bacteria SERRATIA MARCESCENS (A)  Final      Susceptibility   Serratia marcescens - MIC*    CEFAZOLIN >=64 RESISTANT Resistant     CEFTRIAXONE <=1 SENSITIVE Sensitive     CIPROFLOXACIN 1 SENSITIVE Sensitive     GENTAMICIN <=1 SENSITIVE Sensitive     NITROFURANTOIN 256 RESISTANT Resistant     TRIMETH/SULFA <=20 SENSITIVE Sensitive     * 80,000 COLONIES/mL SERRATIA MARCESCENS         Radiology Studies: No results found.      Scheduled Meds: . acetaminophen  1,000 mg Oral  TID  . anastrozole  1 mg Oral Daily  . artificial tears   Both Eyes QHS  . cholecalciferol  10,000 Units Oral Weekly  . enoxaparin (LOVENOX) injection  30 mg Subcutaneous Q24H  . famotidine  20 mg Oral Daily  . ferrous sulfate  325 mg Oral BID WC  . insulin aspart  0-20 Units Subcutaneous TID WC  . insulin aspart  0-5 Units Subcutaneous QHS  . insulin aspart  7 Units Subcutaneous TID WC  . insulin glargine  25 Units Subcutaneous BID  . levothyroxine  150 mcg Oral QAC breakfast  . linagliptin  5 mg Oral Daily  . lip balm  1 application Topical BID  . loratadine  10 mg Oral q morning - 10a  . methocarbamol  1,000 mg Oral QID  . metoCLOPramide  5 mg Oral TID AC  . polyvinyl alcohol  1 drop Both Eyes QHS  . pregabalin  75 mg Oral Daily  . prenatal multivitamin  1 tablet Oral Daily  . protein supplement shake  11 oz Oral BID BM  . psyllium  1 packet Oral Daily  . rosuvastatin  10 mg Oral QPM  . sertraline  50 mg Oral Daily  . Tbo-Filgrastim  480 mcg Subcutaneous Weekly   Continuous Infusions:   LOS: 12 days    Time spent: 35 mins     Lucillia Corson Arsenio Loader, MD [facility] Pager [phone]   If 7PM-7AM, please contact night-coverage [email or web address] Password [facility] 09/10/2017, 12:17 PM

## 2017-09-10 NOTE — Progress Notes (Signed)
Pt complaining of increased back pain at tube insertion sites.  Went to sit up on side of bed and started crying from the pain. MD was notified and an order was given.  Will continue to monitor.

## 2017-09-10 NOTE — Progress Notes (Signed)
Subjective: Patient reports that she continues to have intermittent rectal drainage.  Her nephrostomy tubes are being well tolerated with clear output.  No new complaints otherwise.  Objective: Vital signs in last 24 hours: Temp:  [99.2 F (37.3 C)-101 F (38.3 C)] 99.6 F (37.6 C) (02/09 0540) Pulse Rate:  [80-90] 80 (02/09 0540) Resp:  [16] 16 (02/09 0540) BP: (93-145)/(37-53) 123/45 (02/09 0540) SpO2:  [97 %-98 %] 98 % (02/09 0540) Weight:  [86.1 kg (189 lb 13.1 oz)] 86.1 kg (189 lb 13.1 oz) (02/09 0540)  Intake/Output from previous day: 02/08 0701 - 02/09 0700 In: 1645 [P.O.:1460; I.V.:10] Out: 4425 [Urine:3175; Stool:1250] Intake/Output this shift: Total I/O In: -  Out: 950 [Urine:600; Stool:350]  Physical Exam:  General:alert, cooperative and no distress  Nephrostomy tube drainage is clear.   Lab Results: Recent Labs    09/08/17 0355  HGB 7.7*  HCT 25.2*   BMET Recent Labs    09/08/17 0355 09/09/17 0457  NA 134* 131*  K 3.8 3.9  CL 107 105  CO2 20* 18*  GLUCOSE 150* 228*  BUN 51* 47*  CREATININE 2.88* 2.65*  CALCIUM 8.5* 8.4*   No results for input(s): LABPT, INR in the last 72 hours. No results for input(s): LABURIN in the last 72 hours. Results for orders placed or performed during the hospital encounter of 08/29/17  Culture, Urine     Status: Abnormal   Collection Time: 08/30/17 10:10 AM  Result Value Ref Range Status   Specimen Description URINE, RANDOM  Final   Special Requests NONE  Final   Culture (A)  Final    >=100,000 COLONIES/mL SERRATIA MARCESCENS 80,000 COLONIES/mL GROUP B STREP(S.AGALACTIAE)ISOLATED TESTING AGAINST S. AGALACTIAE NOT ROUTINELY PERFORMED DUE TO PREDICTABILITY OF AMP/PEN/VAN SUSCEPTIBILITY. Performed at [facility], [place] [address] [place], [place] [address]    Report Status 09/01/2017 FINAL  Final   Organism ID, Bacteria SERRATIA MARCESCENS (A)  Final      Susceptibility   Serratia marcescens - MIC*   CEFAZOLIN >=64 RESISTANT Resistant     CEFTRIAXONE <=1 SENSITIVE Sensitive     CIPROFLOXACIN 1 SENSITIVE Sensitive     GENTAMICIN <=1 SENSITIVE Sensitive     NITROFURANTOIN 256 RESISTANT Resistant     TRIMETH/SULFA <=20 SENSITIVE Sensitive     * >=100,000 COLONIES/mL SERRATIA MARCESCENS  Culture, blood (Routine X 2) w Reflex to ID Panel     Status: None   Collection Time: 08/30/17  3:26 PM  Result Value Ref Range Status   Specimen Description   Final    BLOOD LEFT HAND Performed at [facility], [place] [address], [place], [place] [address]    Special Requests   Final    IN PEDIATRIC BOTTLE Blood Culture adequate volume Performed at [facility] [address], [place], [place] [address]    Culture   Final    NO GROWTH 5 DAYS Performed at [facility], [place] [address], [place], [place] [address]    Report Status 09/04/2017 FINAL  Final  Culture, blood (Routine X 2) w Reflex to ID Panel     Status: None   Collection Time: 08/30/17  3:30 PM  Result Value Ref Range Status   Specimen Description   Final    BLOOD LEFT ANTECUBITAL Performed at [facility] [address], [place],  [address]    Special Requests   Final    IN PEDIATRIC BOTTLE Blood Culture adequate volume  Performed at [facility], [place] [address], [place], [place] [address]    Culture   Final    NO GROWTH 5 DAYS Performed at [facility], [place] [address], [place], [place] [address]    Report Status 09/04/2017 FINAL  Final  Culture, Urine     Status: Abnormal   Collection Time: 08/31/17  6:10 PM  Result Value Ref Range Status   Specimen Description   Final    URINE, CATHETERIZED Performed at [facility] [address] [place], [place] [address]    Special Requests   Final    NONE Performed at [facility], [place] [address] [place], [place] [address]    Culture >=100,000 COLONIES/mL SERRATIA  MARCESCENS (A)  Final   Report Status 09/03/2017 FINAL  Final   Organism ID, Bacteria SERRATIA MARCESCENS (A)  Final      Susceptibility   Serratia marcescens - MIC*    CEFAZOLIN >=64 RESISTANT Resistant     CEFTRIAXONE <=1 SENSITIVE Sensitive     CIPROFLOXACIN 1 SENSITIVE Sensitive     GENTAMICIN <=1 SENSITIVE Sensitive     NITROFURANTOIN 256 RESISTANT Resistant     TRIMETH/SULFA <=20 SENSITIVE Sensitive     * >=100,000 COLONIES/mL SERRATIA MARCESCENS  Urine Culture     Status: Abnormal   Collection Time: 09/02/17  5:07 PM  Result Value Ref Range Status   Specimen Description   Final    URINE, RANDOM KIDNEY RIGHT Performed at [facility] [address], [place], [place] [address]    Special Requests   Final    NONE Performed at [facility], [place] [address] [place], [place] [address]    Culture (A)  Final    40,000 COLONIES/mL SERRATIA MARCESCENS 20,000 COLONIES/mL GROUP B STREP(S.AGALACTIAE)ISOLATED TESTING AGAINST S. AGALACTIAE NOT ROUTINELY PERFORMED DUE TO PREDICTABILITY OF AMP/PEN/VAN SUSCEPTIBILITY. >=100,000 COLONIES/mL LACTOBACILLUS SPECIES Standardized susceptibility testing for this organism is not available. 50,000 COLONIES/mL YEAST    Report Status 09/05/2017 FINAL  Final   Organism ID, Bacteria SERRATIA MARCESCENS (A)  Final      Susceptibility   Serratia marcescens - MIC*    CEFAZOLIN >=64 RESISTANT Resistant     CEFTRIAXONE <=1 SENSITIVE Sensitive     CIPROFLOXACIN 1 SENSITIVE Sensitive     GENTAMICIN <=1 SENSITIVE Sensitive     NITROFURANTOIN 256 RESISTANT Resistant     TRIMETH/SULFA <=20 SENSITIVE Sensitive     * 40,000 COLONIES/mL SERRATIA MARCESCENS  Urine Culture     Status: Abnormal   Collection Time: 09/02/17  5:07 PM  Result Value Ref Range Status   Specimen Description URINE, RANDOM  Final   Special Requests   Final    NONE Performed at [facility], [place] [address], [place], [place] [address]     Culture 80,000 COLONIES/mL SERRATIA MARCESCENS (A)  Final   Report Status 09/05/2017 FINAL  Final   Organism ID, Bacteria SERRATIA MARCESCENS (A)  Final      Susceptibility   Serratia marcescens - MIC*    CEFAZOLIN >=64 RESISTANT Resistant     CEFTRIAXONE <=1 SENSITIVE Sensitive     CIPROFLOXACIN 1 SENSITIVE Sensitive     GENTAMICIN <=1 SENSITIVE Sensitive     NITROFURANTOIN 256 RESISTANT Resistant     TRIMETH/SULFA <=20 SENSITIVE Sensitive     * 80,000 COLONIES/mL SERRATIA MARCESCENS    Studies/Results: No results found.  Assessment/Plan: Her very complex urologic history is noted in  previous progress notes.  At this time she has bilateral nephrostomy tubes with a fairly significant amount of output from the left-hand side and about 200-250 cc out from the right-hand side per shift.  Her Foley catheter is still putting out 150-200 cc per shift and she continues to have intermittent drainage per rectum.  This may be clearing up but has persisted to the point where I have recommended she maintain her Foley catheter.  Continue Foley catheter drainage for now.   LOS: 12 days   Jumanah Hynson C 09/10/2017, 9:38 AM

## 2017-09-11 LAB — GLUCOSE, CAPILLARY
GLUCOSE-CAPILLARY: 123 mg/dL — AB (ref 65–99)
Glucose-Capillary: 168 mg/dL — ABNORMAL HIGH (ref 65–99)
Glucose-Capillary: 103 mg/dL — ABNORMAL HIGH (ref 65–99)
Glucose-Capillary: 190 mg/dL — ABNORMAL HIGH (ref 65–99)

## 2017-09-11 MED ORDER — GERHARDT'S BUTT CREAM
TOPICAL_CREAM | Freq: Three times a day (TID) | CUTANEOUS | Status: DC
  Administered 2017-09-11 (×2): via TOPICAL
  Filled 2017-09-11: qty 1

## 2017-09-11 NOTE — Progress Notes (Signed)
PROGRESS NOTE    [name]  FMB:[id number] DOB: 10-31-1955 DOA: 08/29/2017 PCP: Ann Held, DO   Brief Narrative:   62 year old with a history of hyperlipidemia, diabetes mellitus type 2, hypothyroidism, idiopathic neutropenia, breast cancer status post bilateral mastectomy, ovarian and endometrial cancer status post colectomy in 2016 and takedown in August 2018 with ileocecal resection" and anal anastomosis and diverting loop ileostomy along with lysis of adhesion urethral stent exchange with psoas bladder hitch.  CT of her abdomen pelvis showed new colovesicular fistula therefore she was admitted and was also noted to have subacute renal failure.  Nephrology has been following.  Medicine team consulted for diabetes management  Assessment & Plan:   Principal Problem:   MDD (major depressive disorder), single episode, moderate (HCC) Active Problems:   Primary hypothyroidism   DM type 2 (diabetes mellitus, type 2) ([place])   History of ovarian & endometrial cancer   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 s/p resection/partial vaginectomy/ LAR/colostomy 11/19/2014   Morbid obesity (HCC)   Ureteral stricture, right, 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) 04/01/2017   Pelvic cancer s/p colostomy takedown/loop ileostomy diversion 04/01/2017   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  No further changes today. -Continue linagliptin 5 mg daily, Lantus 25 units twice daily, aspart 7 units pre-meals,  sliding scale  Depression -currently on Zoloft and Lyrica, appreciate psychiatry input -Continue Zoloft -Trazodone added as needed at night.  Hypothyroidism -Continue Synthroid  Colovesicular fistula -Management per urology and surgical team -Pain management per surgery  AKI on CKD stage III-4 - Appears to be around her baseline of 2.6.  Closely monitor this.  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 Rectovesical fistula, as above. 2. 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 3. Reflux of contrast material into the right kidney via the right ureter with moderate atrophy of the right kidney. Nonobstructive calculi in the lower pole collecting system of the left kidney measuring up to 11 mm.  4. Additional incidental findings, as above. 08/30/2017 renal until ultrasound: IMPRESSION: 1. Small nonobstructive calculi in the collecting systems of both kidneys. 2. Mild to moderate hydronephrosis.    Subjective: Stable reports of pain at the surgical site otherwise no other complaints.   Objective: Vitals:   09/10/17 0540 09/10/17 1354 09/10/17 2214 09/11/17 0525  BP: (!) 123/45 (!) 119/50 129/61 (!) 117/52  Pulse: 80 87 87 79  Resp: 16 16 16 16   Temp: 99.6 F (37.6 C) 98.9 F (37.2 C) 100.2 F (37.9 C) 98.7 F (37.1 C)  TempSrc: Oral Oral Oral Oral  SpO2: 98% 98% 100% 98%  Weight: 86.1 kg (189 lb 13.1 oz)     Height:  Intake/Output Summary (Last 24 hours) at 09/11/2017 1051 Last data filed at 09/11/2017  0904 Gross per 24 hour  Intake 1970 ml  Output 3560 ml  Net -1590 ml   Filed Weights   09/08/17 0437 09/09/17 0600 09/10/17 0540  Weight: 86.9 kg (191 lb 9.3 oz) 86.2 kg (190 lb 0.6 oz) 86.1 kg (189 lb 13.1 oz)    Examination:  General exam: Mild distress due to abdominal pain. Respiratory system: Clear to auscultation. Respiratory effort normal. Cardiovascular system: S1 & S2 heard, RRR. No JVD, murmurs, rubs, gallops or clicks. No pedal edema. Gastrointestinal system: Abdomen is soft and nontender, ostomy site noted which is dry and intact.  Nephrostomy tubes are in place with clear drainage Central nervous system: Alert and oriented. No focal neurological deficits. Extremities: Symmetric 5 x 5 power. Skin: No rashes, lesions or ulcers Psychiatry: Overall appears to depressed    Data Reviewed:   CBC: Recent Labs  Lab 09/05/17 0433 09/07/17 0324 09/08/17 0355  WBC 5.6 4.2 2.4*  NEUTROABS 4.2  --   --   HGB 8.2* 8.2* 7.7*  HCT 25.0* 26.1* 25.2*  MCV 89.3 90.9 92.6  PLT 272 321 412   Basic Metabolic Panel: Recent Labs  Lab 09/05/17 0433 09/06/17 0348 09/07/17 0324 09/08/17 0355 09/09/17 0457  NA 132* 131* 131* 134* 131*  K 3.4* 3.7 3.7 3.8 3.9  CL 101 101 102 107 105  CO2 22 22 21* 20* 18*  GLUCOSE 239* 156* 114* 150* 228*  BUN 52* 48* 47* 51* 47*  CREATININE 3.00* 3.23* 3.23* 2.88* 2.65*  CALCIUM 8.5* 8.4* 8.5* 8.5* 8.4*  MG 2.0 1.4* 2.1 1.7 1.3*  PHOS 3.4 3.5 4.2 5.1* 4.1   GFR: Estimated Creatinine Clearance: 22.1 mL/min (A) (by C-G formula based on SCr of 2.65 mg/dL (H)). Liver Function Tests: Recent Labs  Lab 09/05/17 0433 09/06/17 0348 09/07/17 0324 09/08/17 0355 09/09/17 0457  ALBUMIN 1.8* 1.6* 1.9* 1.8* 1.8*   No results for input(s): LIPASE, AMYLASE in the last 168 hours. No results for input(s): AMMONIA in the last 168 hours. Coagulation Profile: No results for input(s): INR, PROTIME in the last 168 hours. Cardiac Enzymes: No results  for input(s): CKTOTAL, CKMB, CKMBINDEX, TROPONINI in the last 168 hours. BNP (last 3 results) No results for input(s): PROBNP in the last 8760 hours. HbA1C: No results for input(s): HGBA1C in the last 72 hours. CBG: Recent Labs  Lab 09/10/17 0745 09/10/17 1303 09/10/17 1752 09/10/17 2152 09/11/17 0729  GLUCAP 76 166* 124* 159* 123*   Lipid Profile: No results for input(s): CHOL, HDL, LDLCALC, TRIG, CHOLHDL, LDLDIRECT in the last 72 hours. Thyroid Function Tests: No results for input(s): TSH, T4TOTAL, FREET4, T3FREE, THYROIDAB in the last 72 hours. Anemia Panel: No results for input(s): VITAMINB12, FOLATE, FERRITIN, TIBC, IRON, RETICCTPCT in the last 72 hours. Sepsis Labs: No results for input(s): PROCALCITON, LATICACIDVEN in the last 168 hours.  Recent Results (from the past 240 hour(s))  Urine Culture     Status: Abnormal   Collection Time: 09/02/17  5:07 PM  Result Value Ref [place] Status   Specimen Description   Final    URINE, RANDOM KIDNEY RIGHT Performed at [facility] [address], [place], [place] [address]    Special Requests   Final    NONE Performed at [facility], [place] [address] [place],  [address]    Culture (A)  Final    40,000 COLONIES/mL SERRATIA MARCESCENS 20,000 COLONIES/mL  GROUP B STREP(S.AGALACTIAE)ISOLATED TESTING AGAINST S. AGALACTIAE NOT ROUTINELY PERFORMED DUE TO PREDICTABILITY OF AMP/PEN/VAN SUSCEPTIBILITY. >=100,000 COLONIES/mL LACTOBACILLUS SPECIES Standardized susceptibility testing for this organism is not available. 50,000 COLONIES/mL YEAST    Report Status 09/05/2017 FINAL  Final   Organism ID, Bacteria SERRATIA MARCESCENS (A)  Final      Susceptibility   Serratia marcescens - MIC*    CEFAZOLIN >=64 RESISTANT Resistant     CEFTRIAXONE <=1 SENSITIVE Sensitive     CIPROFLOXACIN 1 SENSITIVE Sensitive     GENTAMICIN <=1 SENSITIVE Sensitive     NITROFURANTOIN 256 RESISTANT Resistant      TRIMETH/SULFA <=20 SENSITIVE Sensitive     * 40,000 COLONIES/mL SERRATIA MARCESCENS  Urine Culture     Status: Abnormal   Collection Time: 09/02/17  5:07 PM  Result Value Ref [place] Status   Specimen Description URINE, RANDOM  Final   Special Requests   Final    NONE Performed at [facility], [place] [address] [place], [place] [address]    Culture 80,000 COLONIES/mL SERRATIA MARCESCENS (A)  Final   Report Status 09/05/2017 FINAL  Final   Organism ID, Bacteria SERRATIA MARCESCENS (A)  Final      Susceptibility   Serratia marcescens - MIC*    CEFAZOLIN >=64 RESISTANT Resistant     CEFTRIAXONE <=1 SENSITIVE Sensitive     CIPROFLOXACIN 1 SENSITIVE Sensitive     GENTAMICIN <=1 SENSITIVE Sensitive     NITROFURANTOIN 256 RESISTANT Resistant     TRIMETH/SULFA <=20 SENSITIVE Sensitive     * 80,000 COLONIES/mL SERRATIA MARCESCENS         Radiology Studies: Dg Lumbar Spine Complete  Result Date: 09/10/2017 CLINICAL DATA:  Pain aggravated by sitting, fell in bathroom 3 days ago, generalized RIGHT hip pain, midline lower back pain, and bottom pain EXAM: LUMBAR SPINE - COMPLETE 4+ VIEW COMPARISON:  CT abdomen and pelvis 08/31/2017 FINDINGS: Osseous demineralization. Five non-rib-bearing lumbar vertebra. Multilevel disc space narrowing. Vertebral body heights maintained. No acute fracture, subluxation, or bone destruction. SI joints preserved. BILATERAL nephrostomy tubes. Numerous pelvic surgical clips as well as RIGHT upper quadrant surgical clips. IMPRESSION: Osseous demineralization with mild degenerative disc disease changes of the lumbar spine. No acute abnormalities. Electronically Signed   By: [name] M.D.   On: 09/10/2017 20:38   Dg Sacrum/coccyx  Result Date: 09/10/2017 CLINICAL DATA:  Pain aggravated by sitting, fell in bathroom 3 days ago, generalized RIGHT hip pain, midline lower back pain, and bottom pain EXAM: SACRUM AND COCCYX - 2+ VIEW COMPARISON:  CT abdomen and pelvis  08/31/2017 FINDINGS: Minimal osteitis pubis. SI joints and sacral foramina symmetric. No sacrococcygeal fractures identified. Degenerative disc disease changes at L5-S1 with disc space narrowing, minimal endplate spur formation and endplate sclerosis. IMPRESSION: No acute sacrococcygeal abnormalities. Degenerative disc disease changes at L5-S1. Electronically Signed   By: [name] M.D.   On: 09/10/2017 20:39   Dg Hip Unilat With Pelvis 1v Right  Result Date: 09/10/2017 CLINICAL DATA:  Pain aggravated by sitting, fell in bathroom 3 days ago, generalized RIGHT hip pain, midline lower back pain, and bottom pain EXAM: DG HIP (WITH OR WITHOUT PELVIS) 1V RIGHT COMPARISON:  None FINDINGS: Hip and SI joints symmetric and preserved. Bones appear slightly demineralized. Minimal osteitis pubis. No acute fracture, dislocation, or bone destruction. IMPRESSION: No acute osseous abnormalities. Electronically Signed   By: [name] M.D.   On: 09/10/2017 20:45        Scheduled Meds: .  acetaminophen  1,000 mg Oral TID  . anastrozole  1 mg Oral Daily  . artificial tears   Both Eyes QHS  . cholecalciferol  10,000 Units Oral Weekly  . enoxaparin (LOVENOX) injection  30 mg Subcutaneous Q24H  . famotidine  20 mg Oral Daily  . ferrous sulfate  325 mg Oral BID WC  . insulin aspart  0-20 Units Subcutaneous TID WC  . insulin aspart  0-5 Units Subcutaneous QHS  . insulin aspart  7 Units Subcutaneous TID WC  . insulin glargine  25 Units Subcutaneous BID  . levothyroxine  150 mcg Oral QAC breakfast  . linagliptin  5 mg Oral Daily  . lip balm  1 application Topical BID  . loratadine  10 mg Oral q morning - 10a  . methocarbamol  1,000 mg Oral QID  . metoCLOPramide  5 mg Oral TID AC  . polyvinyl alcohol  1 drop Both Eyes QHS  . pregabalin  75 mg Oral Daily  . prenatal multivitamin  1 tablet Oral Daily  . protein supplement shake  11 oz Oral BID BM  . psyllium  1 packet Oral Daily  . rosuvastatin  10 mg Oral QPM   . sertraline  50 mg Oral Daily  . Tbo-Filgrastim  480 mcg Subcutaneous Weekly   Continuous Infusions:   LOS: 13 days    Time spent: 35 mins     Sulay Brymer Arsenio Loader, MD [facility] Pager [phone]   If 7PM-7AM, please contact night-coverage [email or web address] Password [facility] 09/11/2017, 10:51 AM

## 2017-09-11 NOTE — Progress Notes (Signed)
Patient ID: [name], female   DOB: 1955/09/24, 62 y.o.   MRN: [id number]    Assessment: Rectal-bladder fistula - She continues to have rectal drainage despite Foley catheter drainage and nephrostomy tube drainage as well.  The fact that she is having output from her Foley catheter indicates that she is not completely diverted by her nephrostomy tubes which are draining.  I discussed with her continued Foley drainage for now.  She is in agreement with this.  Plan: 1.  Continued Foley drainage. 2.  Oil of [place] for her and her family members.    Subjective: Patient reports continued rectal discharge.  She is frustrated and reports that the discharge has an extremely foul odor.  It is causing irritation to the buttocks region.  Objective: Vital signs in last 24 hours: Temp:  [98.7 F (37.1 C)-100.2 F (37.9 C)] 98.7 F (37.1 C) (02/10 0525) Pulse Rate:  [79-87] 79 (02/10 0525) Resp:  [16] 16 (02/10 0525) BP: (117-129)/(50-61) 117/52 (02/10 0525) SpO2:  [98 %-100 %] 98 % (02/10 0525)A  Intake/Output from previous day: 02/09 0701 - 02/10 0700 In: 2270 [P.O.:1620] Out: 4015 [Urine:2390; Stool:1625] Intake/Output this shift: Total I/O In: 1130 [P.O.:480; Other:650] Out: 1450 [Urine:800; Stool:650]  Past Medical History:  Diagnosis Date  . Adrenal adenoma, left 02/08/2016   CT: stable benign  . Anemia in neoplastic disease   . Benign essential HTN   . 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 Cancer of corpus uteri, except isthmus [facility])  oncologist-- dr Denman [name] and dr Alvy Bimler    10-15-2004  dx endometroid endometrial and ovarian cancer s/p  chemotheapy and surgery(TAH w/ BSO) :  recurrent 11-19-2014 post pelvic surgery and radiation 01-29-2015 to 03-10-2015  . Chronic idiopathic neutropenia  (HCC)    presumed related to chemotherapy March 2006--- followed by dr Alvy Bimler (treatment w/ G-CSF injections  . Chronic nausea   . Chronic pain    perineal/ anal  area from bladder pad irritates skin , right flank pain  . CKD stage G2/A3, GFR 60-89 and albumin creatinine ratio >300 mg/g    nephrologist-  dr Madelon Lips  . Diabetic retinopathy, background ([place])   . Difficult intravenous access    small veins--- hx PICC lines  . DM type 2 (diabetes mellitus, type 2) ([place])    monitored by dr Legrand Como altheimer  . Dysuria   . Environmental and seasonal allergies   . Fatty liver 02/08/2016   CT  . Generalized muscle weakness   . GERD (gastroesophageal reflux disease)   . Hiatal hernia   . History of abdominal abscess 04/16/2017   post surgery 04-01-2017  --- resolved 10/ 2018  . History of gastric polyp    2014  duodenum  . History of ileus 04/16/2017   resolved w/ no surgical intervention  . History of radiation therapy    01-29-2015 to 03-10-2015  pelvis 50.4Gy  . Hypothyroidism    monitored by dr Legrand Como altheimer  . Ileostomy in place [facility]) 04/01/2017   created at same time colostomy takedown.  . Lower urinary tract symptoms (LUTS)    urge urinary  incontinence  . Mixed dyslipidemia   . Multiple thyroid nodules    Managed by Dr. Harlow Asa  . Pelvic abscess in female 04/16/2017  . PONV (postoperative nausea and vomiting)    "  scopolamine patch works for me"  . Radiation-induced dermatitis    contact dermatitis , radiation completed, rash only on ankles now.  . Seasonal allergies   . Ureteral stricture, right UROLOGIT-  DR [facility]   CHRONIC--  TREATMENT URETERAL STENT  . Vitamin D deficiency   . Wears glasses     Physical Exam:  Lungs - Normal respiratory effort, chest expands symmetrically.  Abdomen - Soft, non-tender & non-distended.  Lab Results: No results for input(s): WBC, HGB, HCT in the last 72 hours. BMET Recent Labs    09/09/17 0457  NA 131*  K 3.9  CL  105  CO2 18*  GLUCOSE 228*  BUN 47*  CREATININE 2.65*  CALCIUM 8.4*   No results for input(s): LABURIN in the last 72 hours. Results for orders placed or performed during the hospital encounter of 08/29/17  Culture, Urine     Status: Abnormal   Collection Time: 08/30/17 10:10 AM  Result Value Ref Range Status   Specimen Description URINE, RANDOM  Final   Special Requests NONE  Final   Culture (A)  Final    >=100,000 COLONIES/mL SERRATIA MARCESCENS 80,000 COLONIES/mL GROUP B STREP(S.AGALACTIAE)ISOLATED TESTING AGAINST S. AGALACTIAE NOT ROUTINELY PERFORMED DUE TO PREDICTABILITY OF AMP/PEN/VAN SUSCEPTIBILITY. Performed at [facility], [place] [address] [place], [place] [address]    Report Status 09/01/2017 FINAL  Final   Organism ID, Bacteria SERRATIA MARCESCENS (A)  Final      Susceptibility   Serratia marcescens - MIC*    CEFAZOLIN >=64 RESISTANT Resistant     CEFTRIAXONE <=1 SENSITIVE Sensitive     CIPROFLOXACIN 1 SENSITIVE Sensitive     GENTAMICIN <=1 SENSITIVE Sensitive     NITROFURANTOIN 256 RESISTANT Resistant     TRIMETH/SULFA <=20 SENSITIVE Sensitive     * >=100,000 COLONIES/mL SERRATIA MARCESCENS  Culture, blood (Routine X 2) w Reflex to ID Panel     Status: None   Collection Time: 08/30/17  3:26 PM  Result Value Ref [place] Status   Specimen Description   Final    BLOOD LEFT HAND Performed at [facility], [place] [address], [place], [name] [address]    Special Requests   Final    IN PEDIATRIC BOTTLE Blood Culture adequate volume Performed at [facility] [address] [place], [place] [address]    Culture   Final    NO GROWTH 5 DAYS Performed at [facility], [place] [address] [place], [place] [address]    Report Status 09/04/2017 FINAL  Final  Culture, blood (Routine X 2) w Reflex to ID Panel     Status: None   Collection Time: 08/30/17  3:30 PM  Result Value Ref [place] Status   Specimen Description   Final    BLOOD LEFT  ANTECUBITAL Performed at [facility] [address] [place], [place] [address]    Special Requests   Final    IN PEDIATRIC BOTTLE Blood Culture adequate volume Performed at [facility] [address], [place], [place] [address]    Culture   Final    NO GROWTH 5 DAYS Performed at [facility], [place] [address] [place], [place] [address]    Report Status 09/04/2017 FINAL  Final  Culture, Urine     Status: Abnormal   Collection Time: 08/31/17  6:10 PM  Result Value Ref [place] Status   Specimen Description   Final    URINE, CATHETERIZED Performed  at [facility], [place] [address] [place], [place] [address]    Special Requests   Final    NONE Performed at [facility], [place] [address], [place], [place] [address]    Culture >=100,000 COLONIES/mL SERRATIA MARCESCENS (A)  Final   Report Status 09/03/2017 FINAL  Final   Organism ID, Bacteria SERRATIA MARCESCENS (A)  Final      Susceptibility   Serratia marcescens - MIC*    CEFAZOLIN >=64 RESISTANT Resistant     CEFTRIAXONE <=1 SENSITIVE Sensitive     CIPROFLOXACIN 1 SENSITIVE Sensitive     GENTAMICIN <=1 SENSITIVE Sensitive     NITROFURANTOIN 256 RESISTANT Resistant     TRIMETH/SULFA <=20 SENSITIVE Sensitive     * >=100,000 COLONIES/mL SERRATIA MARCESCENS  Urine Culture     Status: Abnormal   Collection Time: 09/02/17  5:07 PM  Result Value Ref [place] Status   Specimen Description   Final    URINE, RANDOM KIDNEY RIGHT Performed at [place] [address], [place], [place] [address]    Special Requests   Final    NONE Performed at [facility], [place] [address], [place], [place] [address]    Culture (A)  Final    40,000 COLONIES/mL SERRATIA MARCESCENS 20,000 COLONIES/mL GROUP B STREP(S.AGALACTIAE)ISOLATED TESTING AGAINST S. AGALACTIAE NOT ROUTINELY PERFORMED DUE TO PREDICTABILITY OF AMP/PEN/VAN  SUSCEPTIBILITY. >=100,000 COLONIES/mL LACTOBACILLUS SPECIES Standardized susceptibility testing for this organism is not available. 50,000 COLONIES/mL YEAST    Report Status 09/05/2017 FINAL  Final   Organism ID, Bacteria SERRATIA MARCESCENS (A)  Final      Susceptibility   Serratia marcescens - MIC*    CEFAZOLIN >=64 RESISTANT Resistant     CEFTRIAXONE <=1 SENSITIVE Sensitive     CIPROFLOXACIN 1 SENSITIVE Sensitive     GENTAMICIN <=1 SENSITIVE Sensitive     NITROFURANTOIN 256 RESISTANT Resistant     TRIMETH/SULFA <=20 SENSITIVE Sensitive     * 40,000 COLONIES/mL SERRATIA MARCESCENS  Urine Culture     Status: Abnormal   Collection Time: 09/02/17  5:07 PM  Result Value Ref [place] Status   Specimen Description URINE, RANDOM  Final   Special Requests   Final    NONE Performed at [facility], [place] [address], [place], [place] [address]    Culture 80,000 COLONIES/mL SERRATIA MARCESCENS (A)  Final   Report Status 09/05/2017 FINAL  Final   Organism ID, Bacteria SERRATIA MARCESCENS (A)  Final      Susceptibility   Serratia marcescens - MIC*    CEFAZOLIN >=64 RESISTANT Resistant     CEFTRIAXONE <=1 SENSITIVE Sensitive     CIPROFLOXACIN 1 SENSITIVE Sensitive     GENTAMICIN <=1 SENSITIVE Sensitive     NITROFURANTOIN 256 RESISTANT Resistant     TRIMETH/SULFA <=20 SENSITIVE Sensitive     * 80,000 COLONIES/mL SERRATIA MARCESCENS    Studies/Results: Dg Lumbar Spine Complete  Result Date: 09/10/2017 CLINICAL DATA:  Pain aggravated by sitting, fell in bathroom 3 days ago, generalized RIGHT hip pain, midline lower back pain, and bottom pain EXAM: LUMBAR SPINE - COMPLETE 4+ VIEW COMPARISON:  CT abdomen and pelvis 08/31/2017 FINDINGS: Osseous demineralization. Five non-rib-bearing lumbar vertebra. Multilevel disc space narrowing. Vertebral body heights maintained. No acute fracture, subluxation, or bone destruction. SI joints preserved. BILATERAL nephrostomy tubes. Numerous pelvic surgical  clips as well as RIGHT upper quadrant surgical clips. IMPRESSION: Osseous demineralization with mild degenerative disc disease changes of the lumbar  spine. No acute abnormalities. Electronically Signed   By: [name] M.D.   On: 09/10/2017 20:38   Dg Sacrum/coccyx  Result Date: 09/10/2017 CLINICAL DATA:  Pain aggravated by sitting, fell in bathroom 3 days ago, generalized RIGHT hip pain, midline lower back pain, and bottom pain EXAM: SACRUM AND COCCYX - 2+ VIEW COMPARISON:  CT abdomen and pelvis 08/31/2017 FINDINGS: Minimal osteitis pubis. SI joints and sacral foramina symmetric. No sacrococcygeal fractures identified. Degenerative disc disease changes at L5-S1 with disc space narrowing, minimal endplate spur formation and endplate sclerosis. IMPRESSION: No acute sacrococcygeal abnormalities. Degenerative disc disease changes at L5-S1. Electronically Signed   By: [name] M.D.   On: 09/10/2017 20:39   Dg Hip Unilat With Pelvis 1v Right  Result Date: 09/10/2017 CLINICAL DATA:  Pain aggravated by sitting, fell in bathroom 3 days ago, generalized RIGHT hip pain, midline lower back pain, and bottom pain EXAM: DG HIP (WITH OR WITHOUT PELVIS) 1V RIGHT COMPARISON:  None FINDINGS: Hip and SI joints symmetric and preserved. Bones appear slightly demineralized. Minimal osteitis pubis. No acute fracture, dislocation, or bone destruction. IMPRESSION: No acute osseous abnormalities. Electronically Signed   By: [name] M.D.   On: 09/10/2017 20:45      Rhealyn Cullen C 09/11/2017, 5:49 AM

## 2017-09-11 NOTE — Progress Notes (Signed)
General Surgery [name]  Assessment & Plan: Principal Problem: Colovesical fistula - delayed Active Problems: 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 s/p resection/partial vaginectomy/ LAR/colostomy 11/19/2014 Ureteral stricture, right, 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) 04/01/2017 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 Primary hypothyroidism DM type 2 (diabetes mellitus, type 2) ([place]) History of ovarian &endometrial cancer Morbid obesity ([place]) Pelvic cancer s/p colostomy takedown/loop ileostomy diversion 04/01/2017 Protein-calorie malnutrition, moderate (HCC) 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HCC) Chronic kidney disease (CKD), stage III (moderate) (HCC) Dehydration ARF (acute renal failure) (HCC) Adjustment disorder with mixed anxiety and depressed mood Poorly controlled diabetes mellitus (HCC) Pressure injury of skin History of external beam radiation therapy to pelvis  See detailed progress note from Dr. Johney Maine Friday 2/8.  X-rays from yesterday show degenerative changes but no acute fracture or injury.  Dr. Karsten Ro managing Foley/perc neph's.  Awaiting Rehab/SNF placement this week at [facility].  Will follow.        [name] Regal, MD, [facility]       Office: [phone]    Chief Complaint: Pelvic irradiation, complex fistulae  Subjective: Patient in bed, comfortable, husband at bedside.  Objective: Vital signs in last 24 hours: Temp:  [98.7 F (37.1 C)-100.2 F (37.9 C)] 98.7 F (37.1 C) (02/10 0525) Pulse Rate:  [79-87] 79 (02/10 0525) Resp:  [16] 16 (02/10 0525) BP: (117-129)/(50-61) 117/52 (02/10 0525) SpO2:  [98 %-100 %] 98 % (02/10 0525) Last BM Date: 09/10/17  Intake/Output from previous day: 02/09 0701 - 02/10 0700 In: 2270 [P.O.:1620] Out: 4515 [Urine:2690; Stool:1825] Intake/Output this shift: Total I/O In: 240  [P.O.:240] Out: 500 [Urine:500]  Physical Exam: HEENT - sclerae clear, mucous membranes moist Abdomen - soft, ostomy with soft small output Foley with small output this AM  Lab Results:  No results for input(s): WBC, HGB, HCT, PLT in the last 72 hours. BMET Recent Labs    09/09/17 0457  NA 131*  K 3.9  CL 105  CO2 18*  GLUCOSE 228*  BUN 47*  CREATININE 2.65*  CALCIUM 8.4*   PT/INR No results for input(s): LABPROT, INR in the last 72 hours. Comprehensive Metabolic Panel:    Component Value Date/Time   NA 131 (L) 09/09/2017 0457   NA 134 (L) 09/08/2017 0355   NA 141 01/24/2017 1228   NA 141 10/07/2016 0929   K 3.9 09/09/2017 0457   K 3.8 09/08/2017 0355   K 4.6 01/24/2017 1228   K 4.5 10/07/2016 0929   CL 105 09/09/2017 0457   CL 107 09/08/2017 0355   CO2 18 (L) 09/09/2017 0457   CO2 20 (L) 09/08/2017 0355   CO2 29 01/24/2017 1228   CO2 30 (H) 10/07/2016 0929   BUN 47 (H) 09/09/2017 0457   BUN 51 (H) 09/08/2017 0355   BUN 22.2 01/24/2017 1228   BUN 23.6 10/07/2016 0929   CREATININE 2.65 (H) 09/09/2017 0457   CREATININE 2.88 (H) 09/08/2017 0355   CREATININE 0.8 01/24/2017 1228   CREATININE 1.0 10/07/2016 0929   GLUCOSE 228 (H) 09/09/2017 0457   GLUCOSE 150 (H) 09/08/2017 0355   GLUCOSE 84 01/24/2017 1228   GLUCOSE 108 10/07/2016 0929   CALCIUM 8.4 (L) 09/09/2017 0457   CALCIUM 8.5 (L) 09/08/2017 0355   CALCIUM 10.2 01/24/2017 1228   CALCIUM 10.7 (H) 10/07/2016 0929   AST 16 08/29/2017 1550   AST 24 05/27/2017 1724   AST 16 01/24/2017 1228  AST 15 10/07/2016 0929   ALT 24 08/29/2017 1550   ALT 33 05/27/2017 1724   ALT 14 01/24/2017 1228   ALT 16 10/07/2016 0929   ALKPHOS 173 (H) 08/29/2017 1550   ALKPHOS 166 (H) 05/27/2017 1724   ALKPHOS 90 01/24/2017 1228   ALKPHOS 123 10/07/2016 0929   BILITOT 0.5 08/29/2017 1550   BILITOT 0.3 05/27/2017 1724   BILITOT 0.34 01/24/2017 1228   BILITOT 0.31 10/07/2016 0929   PROT 6.8 08/29/2017 1550   PROT 9.2  (H) 05/27/2017 1724   PROT 6.9 01/24/2017 1228   PROT 8.2 10/07/2016 0929   ALBUMIN 1.8 (L) 09/09/2017 0457   ALBUMIN 1.8 (L) 09/08/2017 0355   ALBUMIN 3.4 (L) 01/24/2017 1228   ALBUMIN 3.8 10/07/2016 0929    Studies/Results: Dg Lumbar Spine Complete  Result Date: 09/10/2017 CLINICAL DATA:  Pain aggravated by sitting, fell in bathroom 3 days ago, generalized RIGHT hip pain, midline lower back pain, and bottom pain EXAM: LUMBAR SPINE - COMPLETE 4+ VIEW COMPARISON:  CT abdomen and pelvis 08/31/2017 FINDINGS: Osseous demineralization. Five non-rib-bearing lumbar vertebra. Multilevel disc space narrowing. Vertebral body heights maintained. No acute fracture, subluxation, or bone destruction. SI joints preserved. BILATERAL nephrostomy tubes. Numerous pelvic surgical clips as well as RIGHT upper quadrant surgical clips. IMPRESSION: Osseous demineralization with mild degenerative disc disease changes of the lumbar spine. No acute abnormalities. Electronically Signed   By: [name] M.D.   On: 09/10/2017 20:38   Dg Sacrum/coccyx  Result Date: 09/10/2017 CLINICAL DATA:  Pain aggravated by sitting, fell in bathroom 3 days ago, generalized RIGHT hip pain, midline lower back pain, and bottom pain EXAM: SACRUM AND COCCYX - 2+ VIEW COMPARISON:  CT abdomen and pelvis 08/31/2017 FINDINGS: Minimal osteitis pubis. SI joints and sacral foramina symmetric. No sacrococcygeal fractures identified. Degenerative disc disease changes at L5-S1 with disc space narrowing, minimal endplate spur formation and endplate sclerosis. IMPRESSION: No acute sacrococcygeal abnormalities. Degenerative disc disease changes at L5-S1. Electronically Signed   By: [name] M.D.   On: 09/10/2017 20:39   Dg Hip Unilat With Pelvis 1v Right  Result Date: 09/10/2017 CLINICAL DATA:  Pain aggravated by sitting, fell in bathroom 3 days ago, generalized RIGHT hip pain, midline lower back pain, and bottom pain EXAM: DG HIP (WITH OR WITHOUT PELVIS)  1V RIGHT COMPARISON:  None FINDINGS: Hip and SI joints symmetric and preserved. Bones appear slightly demineralized. Minimal osteitis pubis. No acute fracture, dislocation, or bone destruction. IMPRESSION: No acute osseous abnormalities. Electronically Signed   By: [name] M.D.   On: 09/10/2017 20:45      Jerome M 09/11/2017  Patient ID: [name], female   DOB: 1956/02/01, 62 y.o.   MRN: [id number]

## 2017-09-11 NOTE — Progress Notes (Signed)
Pt and husband concerned about sacral/buttock wound. Area assessed and measured. Consulted with Maudie Flakes, WOC RN regarding state of wound. Wound care orders entered into Epic per Laurie's recommendations. Pt and husband aware.

## 2017-09-11 NOTE — Progress Notes (Signed)
Order to obtain wintergreen oil OR and pharmacy was called and I was told they do not have anymore.

## 2017-09-12 ENCOUNTER — Encounter (HOSPITAL_COMMUNITY): Payer: Self-pay | Admitting: Surgery

## 2017-09-12 LAB — GLUCOSE, CAPILLARY
GLUCOSE-CAPILLARY: 124 mg/dL — AB (ref 65–99)
GLUCOSE-CAPILLARY: 107 mg/dL — AB (ref 65–99)
GLUCOSE-CAPILLARY: 153 mg/dL — AB (ref 65–99)
Glucose-Capillary: 143 mg/dL — ABNORMAL HIGH (ref 65–99)

## 2017-09-12 LAB — CREATININE, SERUM
CREATININE: 2.47 mg/dL — AB (ref 0.44–1.00)
GFR, EST AFRICAN AMERICAN: 23 mL/min — AB (ref >60)
GFR, EST NON AFRICAN AMERICAN: 20 mL/min — AB (ref >60)

## 2017-09-12 LAB — PREALBUMIN: Prealbumin: 9.4 mg/dL — ABNORMAL LOW (ref 18–38)

## 2017-09-12 LAB — PHOSPHORUS: PHOSPHORUS: 4.5 mg/dL (ref 2.5–4.6)

## 2017-09-12 LAB — MAGNESIUM: MAGNESIUM: 1.1 mg/dL — AB (ref 1.7–2.4)

## 2017-09-12 LAB — POTASSIUM: Potassium: 4 mmol/L (ref 3.5–5.1)

## 2017-09-12 MED ORDER — GERHARDT'S BUTT CREAM
TOPICAL_CREAM | Freq: Two times a day (BID) | CUTANEOUS | Status: DC
  Administered 2017-09-12: 1 via TOPICAL
  Filled 2017-09-12: qty 1

## 2017-09-12 MED ORDER — MAGNESIUM SULFATE 4 GM/100ML IV SOLN
4.0000 g | Freq: Once | INTRAVENOUS | Status: AC
  Administered 2017-09-12: 4 g via INTRAVENOUS
  Filled 2017-09-12: qty 100

## 2017-09-12 MED ORDER — METHOCARBAMOL 500 MG PO TABS
1000.0000 mg | ORAL_TABLET | Freq: Four times a day (QID) | ORAL | Status: DC | PRN
  Administered 2017-09-12 – 2017-09-25 (×9): 1000 mg via ORAL
  Filled 2017-09-12 (×9): qty 2

## 2017-09-12 MED ORDER — GERHARDT'S BUTT CREAM
TOPICAL_CREAM | Freq: Four times a day (QID) | CUTANEOUS | Status: AC
  Administered 2017-09-12 – 2017-09-14 (×7): via TOPICAL
  Administered 2017-09-14: 1 via TOPICAL
  Administered 2017-09-14 – 2017-09-15 (×2): via TOPICAL
  Administered 2017-09-15 (×2): 1 via TOPICAL
  Administered 2017-09-16 (×3): via TOPICAL
  Administered 2017-09-16: 1 via TOPICAL
  Administered 2017-09-17 (×3): via TOPICAL
  Filled 2017-09-12 (×2): qty 1

## 2017-09-12 MED ORDER — PREGABALIN 100 MG PO CAPS
100.0000 mg | ORAL_CAPSULE | Freq: Every day | ORAL | Status: DC
  Administered 2017-09-12 – 2017-09-15 (×4): 100 mg via ORAL
  Filled 2017-09-12 (×3): qty 1
  Filled 2017-09-12: qty 2
  Filled 2017-09-12: qty 1

## 2017-09-12 MED ORDER — LIDOCAINE 5 % EX PTCH
1.0000 | MEDICATED_PATCH | CUTANEOUS | Status: DC
  Administered 2017-09-12 – 2017-09-25 (×14): 1 via TRANSDERMAL
  Filled 2017-09-12 (×14): qty 1

## 2017-09-12 MED ORDER — METOCLOPRAMIDE HCL 5 MG PO TABS
5.0000 mg | ORAL_TABLET | Freq: Four times a day (QID) | ORAL | Status: DC | PRN
  Administered 2017-09-14 – 2017-09-16 (×2): 5 mg via ORAL
  Filled 2017-09-12 (×2): qty 1

## 2017-09-12 MED ORDER — HYDROMORPHONE HCL 2 MG PO TABS
2.0000 mg | ORAL_TABLET | ORAL | Status: DC | PRN
  Administered 2017-09-12: 2 mg via ORAL
  Administered 2017-09-13: 4 mg via ORAL
  Administered 2017-09-13: 2 mg via ORAL
  Filled 2017-09-12: qty 1
  Filled 2017-09-12: qty 2
  Filled 2017-09-12: qty 1

## 2017-09-12 NOTE — Progress Notes (Signed)
Was just now able to obtain air mattress that was ordered yesterday afternoon. Pt ambulated in room to change out beds. She did not ambulate in hallway, it is 0150. Pt crying frequently. Cried because said nephrostomy dressings had not been changed. I changed them, left had purulent drainage around site, right did not. She cried because she feels her husband needs therapy. He was very loud and verbally aggressive earlier in shift. Very frustrated with multiple situations. Will pass on to management in am that pt wants to speak to upper management. Will continue care and try to comfort patient when she feels things are out of her control. Hoyle Barr, RN-C

## 2017-09-12 NOTE — Progress Notes (Addendum)
CSW checked in with Whitney in admission at [facility], they do not have space available today. She is unsure when bed will be available this week. Loree Fee will call CSW if bed opens.  There are no private rooms at [facility].   Kathrin Greathouse, Latanya Presser, MSW Clinical Social Worker  [phone] 09/12/2017  12:26 PM

## 2017-09-12 NOTE — Progress Notes (Signed)
Patient ID: [name], female   DOB: April 22, 1956, 62 y.o.   MRN: [id number]    Referring Physician(s): Dr. Michael [name]  Supervising Physician: Markus Daft  Patient Status: [facility] - In-pt  Chief Complaint: Bilateral hydronephrosis  Subjective: Patient is feeling slightly better today.  She feel towards the end of the week and hit the left side of her back, but has then had right sided back pain around her PCN all weekend.  She currently has a heating pack on and says her pain is slowly improving.  Allergies: Penicillins; Ultram [tramadol]; Adhesive [tape]; Cefaclor; Erythromycin; Trimethoprim; Ciprofloxacin; Fluconazole; Oxycodone; Pectin; and Sulfa antibiotics  Medications: Prior to Admission medications   Medication Sig Start Date End Date Taking? Authorizing Provider  anastrozole (ARIMIDEX) 1 MG tablet TAKE 1 TABLET DAILY 12/08/16  Yes Gorsuch, Ni, MD  Biotin 5 MG TABS Take 5 mg by mouth every morning.    Yes [provider]  Cholecalciferol (VITAMIN D3) 10000 UNITS capsule Take 10,000 Units by mouth once a week. Sunday evening's   Yes [provider]  diphenoxylate-atropine (LOMOTIL) 2.5-0.025 MG tablet Take 1-2 tablets by mouth every 8 (eight) hours. TO PREVENT LOOSE BOWEL MOVEMENTS 05/20/17  Yes [provider]  Famotidine-Ca Carb-Mag Hydrox (PEPCID COMPLETE PO) Take 1 tablet by mouth daily as needed (INDIGESTION).   Yes [provider]  filgrastim (NEUPOGEN) 480 MCG/1.6ML injection Inject 1.6 ml under the skin every 5 days for life Patient taking differently: Inject 480 mcg into the skin See admin instructions. Inject 1.6 ml under the skin every 6 days for life 01/06/17  Yes Gorsuch, Ni, MD  levothyroxine (SYNTHROID, LEVOTHROID) 150 MCG tablet Take 150 mcg by mouth daily before breakfast.    Yes [provider]  loratadine (CLARITIN) 10 MG tablet Take 10 mg by mouth every morning.    Yes [provider]  omega-3 acid ethyl esters  (LOVAZA) 1 G capsule Take 1 g by mouth 2 (two) times daily.    Yes [provider]  ondansetron (ZOFRAN-ODT) 4 MG disintegrating tablet Take 4 mg by mouth every 6 (six) hours as needed for nausea or vomiting.  04/12/17  Yes [provider]  Polyethyl Glycol-Propyl Glycol (SYSTANE OP) Place 1 drop into both eyes at bedtime.    Yes [provider]  Prenatal Vit-Fe Fumarate-FA (PRENATAL VITAMIN PO) Take 1 capsule by mouth daily. Takes prenatal because there are no dyes in it   Yes [provider]  prochlorperazine (COMPAZINE) 10 MG tablet Take 1 tablet (10 mg total) by mouth 2 (two) times daily as needed for nausea or vomiting. Patient taking differently: Take 10 mg by mouth 2 (two) times daily as needed for nausea or vomiting.  05/28/17  Yes Virgel Manifold, MD  promethazine (PHENERGAN) 25 MG tablet Take 1 tablet (25 mg total) by mouth every 6 (six) hours as needed for nausea or vomiting. 05/28/17  Yes Virgel Manifold, MD  ranitidine (ZANTAC) 150 MG tablet Take 150 mg by mouth 2 (two) times daily as needed for heartburn.    Yes [provider]  rosuvastatin (CRESTOR) 10 MG tablet Take 10 mg by mouth every evening.    Yes [provider]  sitaGLIPtin (JANUVIA) 100 MG tablet Take 100 mg by mouth every morning.    Yes [provider]  HYDROcodone-acetaminophen (NORCO/VICODIN) 5-325 MG tablet Take 1 tablet by mouth every 6 (six) hours as needed (Pain). Patient not taking: Reported on 08/29/2017 08/10/17   Shelda Pal, DO  Vital Signs: BP (!) 134/49 (BP Location: Right Leg)   Pulse 71   Temp 98.9 F (37.2 C) (Oral)   Resp 18   Ht 5\' 2"  (1.575 m)   Wt 168 lb (76.2 kg)   SpO2 100%   BMI 30.73 kg/m   Physical Exam: Abd: B PCNs in place in posterior abd.  Both sites are c/d/i.  Both drains have pale yellow urine output.  1425cc from left yesterday and 525cc from right.  Imaging: Dg Lumbar Spine Complete  Result Date:  09/10/2017 CLINICAL DATA:  Pain aggravated by sitting, fell in bathroom 3 days ago, generalized RIGHT hip pain, midline lower back pain, and bottom pain EXAM: LUMBAR SPINE - COMPLETE 4+ VIEW COMPARISON:  CT abdomen and pelvis 08/31/2017 FINDINGS: Osseous demineralization. Five non-rib-bearing lumbar vertebra. Multilevel disc space narrowing. Vertebral body heights maintained. No acute fracture, subluxation, or bone destruction. SI joints preserved. BILATERAL nephrostomy tubes. Numerous pelvic surgical clips as well as RIGHT upper quadrant surgical clips. IMPRESSION: Osseous demineralization with mild degenerative disc disease changes of the lumbar spine. No acute abnormalities. Electronically Signed   By: [name] M.D.   On: 09/10/2017 20:38   Dg Sacrum/coccyx  Result Date: 09/10/2017 CLINICAL DATA:  Pain aggravated by sitting, fell in bathroom 3 days ago, generalized RIGHT hip pain, midline lower back pain, and bottom pain EXAM: SACRUM AND COCCYX - 2+ VIEW COMPARISON:  CT abdomen and pelvis 08/31/2017 FINDINGS: Minimal osteitis pubis. SI joints and sacral foramina symmetric. No sacrococcygeal fractures identified. Degenerative disc disease changes at L5-S1 with disc space narrowing, minimal endplate spur formation and endplate sclerosis. IMPRESSION: No acute sacrococcygeal abnormalities. Degenerative disc disease changes at L5-S1. Electronically Signed   By: [name] M.D.   On: 09/10/2017 20:39   Dg Hip Unilat With Pelvis 1v Right  Result Date: 09/10/2017 CLINICAL DATA:  Pain aggravated by sitting, fell in bathroom 3 days ago, generalized RIGHT hip pain, midline lower back pain, and bottom pain EXAM: DG HIP (WITH OR WITHOUT PELVIS) 1V RIGHT COMPARISON:  None FINDINGS: Hip and SI joints symmetric and preserved. Bones appear slightly demineralized. Minimal osteitis pubis. No acute fracture, dislocation, or bone destruction. IMPRESSION: No acute osseous abnormalities. Electronically Signed   By: [name]  M.D.   On: 09/10/2017 20:45    Labs:  CBC: Recent Labs    09/04/17 0416 09/05/17 0433 09/07/17 0324 09/08/17 0355  WBC 13.6* 5.6 4.2 2.4*  HGB 8.6* 8.2* 8.2* 7.7*  HCT 26.7* 25.0* 26.1* 25.2*  PLT 268 272 321 333    COAGS: Recent Labs    04/16/17 0047 09/02/17 0939  INR 1.21 1.21    BMP: Recent Labs    09/06/17 0348 09/07/17 0324 09/08/17 0355 09/09/17 0457 09/12/17 0329  NA 131* 131* 134* 131*  --   K 3.7 3.7 3.8 3.9 4.0  CL 101 102 107 105  --   CO2 22 21* 20* 18*  --   GLUCOSE 156* 114* 150* 228*  --   BUN 48* 47* 51* 47*  --   CALCIUM 8.4* 8.5* 8.5* 8.4*  --   CREATININE 3.23* 3.23* 2.88* 2.65* 2.47*  GFRNONAA 14* 14* 16* 18* 20*  GFRAA 16* 16* 19* 21* 23*    LIVER FUNCTION TESTS: Recent Labs    04/20/17 0510 04/21/17 0416 05/27/17 1724 08/29/17 1550  09/06/17 0348 09/07/17 0324 09/08/17 0355 09/09/17 0457  BILITOT 0.4 0.5 0.3 0.5  --   --   --   --   --  AST 13* 13* 24 16  --   --   --   --   --   ALT 10* 10* 33 24  --   --   --   --   --   ALKPHOS 177* 190* 166* 173*  --   --   --   --   --   PROT 5.8* 5.9* 9.2* 6.8  --   --   --   --   --   ALBUMIN 1.8* 1.8* 3.9 2.1*   < > 1.6* 1.9* 1.8* 1.8*   < > = values in this interval not displayed.    Assessment and Plan: 1. B PCNs for hydro  Despite fall on the left side, she has been having right-sided back pain.  This is somewhat improved today after a heating pad has been added.  She is unclear if this is from a muscle spasm or something else.  Her drain is still working well.  It is not uncommon for one kidney to produce more urine than the other, and in her case, her left kidney was more "full/plump" than the right to start.  As long as her pain continues to improve, we will follow.  If her pain, worsens again or fails to get better, then she may need a nephrostogram to make sure her drain is positioned well.  Will follow.  Electronically Signed: Henreitta Cea 09/12/2017, 11:46 AM   I  spent a total of 15 Minutes at the the patient's bedside AND on the patient's hospital floor or unit, greater than 50% of which was counseling/coordinating care for bilateral hydronephrosis

## 2017-09-12 NOTE — Consult Note (Signed)
[place] Nurse ostomy follow up Stoma type/location: RLQ ileostomy Stomal assessment/size: 1 and 1/8 inch Peristomal assessment: peristomal irritant dermatitis chronic, improved.  Pouch applied Saturday (or Sunday) pm is with new leak at 1 o'clock.  Treatment options for stomal/peristomal skin:  Pouch removed. Skin cleansed with water and adhesive remover used to remove residual.  Washed peristomal skin with soap and water, rinsed well. [place] adhesive applied and fanned dry.   Pouch prepared, using Eakin ring and 2 one-third pieces at 3 and 9 o'clock. Medical adhesive spray used on skin barrier and on skin and allowed to dry for 5 minutes. Eakin paste applied around stoma and patted with wet finger to smooth. Pouching system applied and held in place for 3-4 minutes. Output: brown effluent  Ostomy pouching: 1pc.with skin barrier rings (see above) Education provided: None. Enrolled patient in [place] Discharge program: Yes, previously.  [place] Nurse wound follow up Wound type: friction and moisture with infectious process (POA, but worse since admission).  Patient is on therapeutic mattress with low air loss feature. She reports relief from surface. Measurement: Right buttock with 6cm x 3.4cm x 0.2cm with shagged appearance consistent with shearing and repetitive moisture Left buttock with 3cm x 2cm x 0.2cm with lesser degree of shagged appearance. Wound bed: improved over yesterday with three doses of Gerhart's Butt cream.  Will increase to 4 times daily. Drainage (amount, consistency, odor) None Periwound: Shagged appearance Dressing procedure/placement/frequency: Will increase Gerhart's cream to QID. Patient assisted to lie in side lying position.   [place] nursing team will follow, and will remain available to this patient, the nursing, surgical and medical teams.  Thanks, Maudie Flakes, MSN, RN, [place], Arther Abbott  Pager# [phone]

## 2017-09-12 NOTE — Progress Notes (Addendum)
[facility]  [address] [place], [place] [address] Phone: [phone]  FAX: [phone]      [name] [id number] [birth year]  CARE TEAM:  PCP: Ann Held, DO  Outpatient Care Team: Patient Care Team: Carollee Herter, Alferd Apa, DO as PCP - General (Family Medicine) Heath Lark, MD as Consulting Physician (Hematology and Oncology) Everitt Amber, MD as Consulting Physician (Obstetrics and Gynecology) Michael [name], MD as Consulting Physician (General Surgery) Alexis Frock, MD as Consulting Physician (Urology) Altheimer, Legrand Como, MD as Consulting Physician (Endocrinology) Katy Apo, MD as Consulting Physician (Ophthalmology) Irene Limbo, MD as Consulting Physician (Plastic Surgery) Madelon Lips, MD as Consulting Physician (Nephrology)  Inpatient Treatment Team: Treatment Team: Attending Provider: Michael [name], MD; Consulting Physician: Alexis Frock, MD; Registered Nurse: Illene Regulus, RN; Technician: Reuel Derby, NT; Consulting Physician: Fatima Blank, MD; Consulting Physician: Michael [name], MD; Technician: Leda Quail, NT; Consulting Physician: Patrecia Pour, MD; Consulting Physician: Heath Lark, MD; Technician: Estrellita Ludwig, [place]; Rounding Team: Dorthy Cooler Radiology, MD; Technician: Sueanne Margarita, [place]; Consulting Physician: Faythe Dingwall, DO; Consulting Physician: Damita Lack, MD; Registered Nurse: Bobbye Charleston, RN; Registered Nurse: Hoyle Barr, RN; Registered Nurse: Pennie Rushing, RN; Physical Therapist: Alphonzo Severance, PT   Problem List:   Principal Problem:   MDD (major depressive disorder), single episode, moderate ([place]) Active Problems:   Chronic kidney disease (CKD), stage III (moderate) ([place])   Colovesical fistula - delayed   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 s/p resection/partial vaginectomy/ LAR/colostomy 11/19/2014    Ureteral stricture, right, 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) 04/01/2017   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   Primary hypothyroidism   DM type 2 (diabetes mellitus, type 2) ([place])   History of ovarian & endometrial cancer   Morbid obesity ([place])   Pelvic cancer s/p colostomy takedown/loop ileostomy diversion 04/01/2017   Protein-calorie malnutrition, moderate ([place])   Dehydration   ARF (acute renal failure) ([place])   Adjustment disorder with mixed anxiety and depressed mood   Poorly controlled diabetes mellitus ([place])   Pressure injury of skin   History of external beam radiation therapy to pelvis  SURGERY 04/01/2017     POST-OPERATIVE DIAGNOSIS:   Recurrent endometrial cancer status post low anterior resection and partial pelvic exeneration  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   SURGEON:Dyllen Menning Gwynneth Aliment, MD- Primary (Dr Tresa Moore assist) PROCEDURE:  XI ROBOTIC LYSIS OF ADHESIONS x 5 hours XI ROBOTIC COLOSTOMY TAKEDOWN ROBOTIC SEWN COLOANAL ANASTOMOSIS Ileocecal resection with anastomosis Diverting loop ileostomy  SURGEON: Alexis Frock, MD - Primary (Dr Johney [place] assist) PROCEDURE:  Cystoscopy with R retrograde and stent exchange.  Robotic R ureteral reimplant with psoas bladder hitch.  Consults: urology   SURGERY 08/17/2017  DIAGNOSIS:  Right ureteral stricture, status post complex reconstruction.  PROCEDURES: 1. Cystoscopy with right retrograde pyelogram. 2. Removal of right ureteral stent.  PHYSICIAN:  Alexis Frock, MD        SPECIMENS:  Right ureteral stone for discard, inspected and intact.  FINDINGS: 1. Fusiform bladder shape consistent with prior psoas hitch. 2. Visibly patent ureteral-bladder anastomosis. 3. No evidence of hydronephrosis or narrowing with right retrograde     pyelogram.    Assessment  Struggling with pain control  Delayed bladder leak turned into colovesical fistula with  preferential urinary drainage in decompressed diverted colon limb.  Plan:   Inadequate pain control.  Oral Dilaudid helps.  Scheduled Tylenol.  Lyrica  increased. Change robaxin to PRN (scheduled not helping & ?sedating) Still needing IV pain meds.  Add lidocaine patch.  Ask palliative care for help in pain management.  Pain at R back nephrostomy tube site - Hrays w/o fracture.  Change dressing more often.  HAve Urology & IR recheck  Hemoglobin low but stable in pancytopenic pt.  Iron deficiency anemia with borderline hemoglobin.  Given another dose of IV dextran this admission.  Retry oral iron.  She had difficulty absorbing and tolerating.  With urine leak better controlled, perhaps she can tolerate better now  Discussed with urology, Dr. Tresa Moore.  S/p percutaneous nephrostomy tubes to divert urine directly out of kidneys and out of the bladder that is draining into her neorectum/colon.  Still needing Foley catheter.  Still leaking urine output anus.  Somewhat improved but fair.  Suspect Ileal conduit imminent   Follow off Cipro  -Mag low - replace.  K & Phos OK.  Follow  Lyrica for chronic pain - increase from 75 to 175m .  Zoloft for depression.  Psychiatric evaluation done - added trazadone QHS & rec PCP f/u.  Psych f/u PRN  Diabetes better controlled.  D/w TRH.  Continue lantus Bid & PO.  QAC insulin  Solid diet.  Bowel regimen.  Seems to be thickening up.    Ostomy care - not major issues w ileostomy  Continue neupogen injections for pancytopenia  VTE prophylaxis: SCDs, etc  Ambulate as tolerated to help recovery    Leak on the left lateral inferiolateral corner of the cystostomy Heineke-Mikulicz and psoas hitch needed for the bladder to reach the right ureter at that had the chronic severe distal stricture.  I am highly skeptical that a radiated bladder with a delayed leak will spontaneously heal.  She had internal Foley drainage, external surgical drainage, internal stenting  drainage for a few months, yet this happened. Therefore, most likely will require surgical repair.  Standard of care usually on this situation is to resect colon & do a new distal anastomosis. Primarily repair the bladder & patch it.  However, there is no way I can redo anastomosis in a radiated pelvis with prior surgeries with prior anterior exoneration, radiation, 7-hour lysis adhesions and prior surgery when she was in better shape.  Only options I can see is to try & primarily patch the colon with OMENTOPEXY, primarily repair the bladder with OMENTOPEXY, and hope it will heal.  I am skeptical that it will.  Most likely we will have to convert to an end colostomy to get the colon away from it and allow the bladder to be patched.  I do agree with Dr. MTresa Moorethat she is rather malnourished and deconditioned at this point so would like to try and hold off on definitive repair if we can.   I suspect that her bladder is probably not salvageable and she may need a completion cystectomy with ileal conduit.  I will defer to Dr. MZettie Phoexpertise with urology.  We will discuss that later once she is in better shape to actually tolerate a reoperation should it come to that (very likely).  Home health nursing seems to have failed.  Lack of outpatient ostomy clinic in town here making outpatient management challenging.   I strongly suspect she will need a SNF.  Patient more motivated to get up and move around.  She is still embarrassed in frustrated with persistent urine leak out anus.  But she is getting up some.  Work to  try and transition to outpatient rehab with SNF next week.  Needs better pain control     35 minutes spent in review, evaluation, examination, counseling, and coordination of care.  More than 50% of that time was spent in counseling.  [name], M.D., F.A.C.S. Gastrointestinal and Minimally Invasive Surgery [facility] [address] [place], [place]  [address] [phone] Main / Paging    09/12/2017    Subjective: (Chief complaint)  Crying [name] had bad weekend but then had a good night. Upset that husband angry & frustrated yesterday.  Wife worried Pain from right back nephrostomy tube site - Dilaidid helps but hurting too much to get out of bed Getting worsening buttock ulcers IV medication better controlling but still needing. Tolerating food better.  Ostomy output better controlled.  Objective:  Vital signs:  Vitals:   09/11/17 1309 09/11/17 2315 09/12/17 0100 09/12/17 0542  BP: (!) 151/73 121/74  (!) 134/49  Pulse: 87 78  71  Resp: _0 Temp: 98.8 F (37.1 C) 99.1 F (37.3 C)  98.9 F (37.2 C)  TempSrc: Oral Oral  Oral  SpO2: 100% 100%  100%  Weight:   76.2 kg (168 lb)   Height:        Last BM Date: 09/11/17  Intake/Output   Yesterday:  02/10 0701 - 02/11 0700 In: 960 [P.O.:960] Out: 3325 [Urine:2250; Stool:1075] This shift:  No intake/output data recorded.  Bowel function:  Flatus: YES   BM:  YES.  Thick oatmeal consistency succus in ileostomy bag.  Drain: No drains.     Physical Exam:  General: Pt awake/alert/oriented x4 in moderate acute distress. Eyes: PERRL, normal EOM.  Sclera clear.  No icterus Neuro: CN II-XII intact w/o focal sensory/motor deficits. Lymph: No head/neck/groin lymphadenopathy Psych:  No delerium/psychosis/paranoia.  Crying but consolable HENT: Normocephalic, Mucus membranes moist.  No thrush Neck: Supple, No tracheal deviation Chest: No chest wall pain w good excursion CV:  Pulses intact.  Regular rhythm MS: Normal AROM mjr joints.  No obvious deformity.  Bilateral perc nephrostomy bags - R posterior sensitive but no cellultitis  Abdomen: Soft.  Nondistended.  No tenderness to palpation.  No evidence of peritonitis.  No incarcerated hernias.  No wounds.  Ileostomy pink with gas and succus in bag.      GU.  No purulent drainage or hematoma.  Foley in place  with clear light yellow urine  Rectal deferred.  Last week had stricturing at coloanal anastomosis.  Had been finger dilated.  Held off on that today. Ext:   No deformity.  No mjr edema.  No cyanosis Skin: No petechiae / purpura  Results:   Labs: Results for orders placed or performed during the hospital encounter of 08/29/17 (from the past 48 hour(s))  Glucose, capillary     Status: Abnormal   Collection Time: 09/10/17  1:03 PM  Result Value Ref Range   Glucose-Capillary 166 (H) 65 - 99 mg/dL  Glucose, capillary     Status: Abnormal   Collection Time: 09/10/17  5:52 PM  Result Value Ref Range   Glucose-Capillary 124 (H) 65 - 99 mg/dL  Glucose, capillary     Status: Abnormal   Collection Time: 09/10/17  9:52 PM  Result Value Ref Range   Glucose-Capillary 159 (H) 65 - 99 mg/dL  Glucose, capillary     Status: Abnormal   Collection Time: 09/11/17  7:29 AM  Result Value Ref Range  Glucose-Capillary 123 (H) 65 - 99 mg/dL  Glucose, capillary     Status: Abnormal   Collection Time: 09/11/17  1:23 PM  Result Value Ref Range   Glucose-Capillary 168 (H) 65 - 99 mg/dL  Glucose, capillary     Status: Abnormal   Collection Time: 09/11/17  4:32 PM  Result Value Ref Range   Glucose-Capillary 103 (H) 65 - 99 mg/dL  Glucose, capillary     Status: Abnormal   Collection Time: 09/11/17 10:44 PM  Result Value Ref Range   Glucose-Capillary 190 (H) 65 - 99 mg/dL  Creatinine, serum     Status: Abnormal   Collection Time: 09/12/17  3:29 AM  Result Value Ref Range   Creatinine, Ser 2.47 (H) 0.44 - 1.00 mg/dL   GFR calc non Af Amer 20 (L) >60 mL/min   GFR calc Af Amer 23 (L) >60 mL/min    Comment: (NOTE) The eGFR has been calculated using the CKD EPI equation. This calculation has not been validated in all clinical situations. eGFR's persistently <60 mL/min signify possible Chronic Kidney Disease. Performed at [facility], [place] [address] [place], [place] [address]    Magnesium     Status: Abnormal   Collection Time: 09/12/17  3:29 AM  Result Value Ref Range   Magnesium 1.1 (L) 1.7 - 2.4 mg/dL    Comment: Performed at [facility], [place] [address], [place], [place] [address]  Phosphorus     Status: None   Collection Time: 09/12/17  3:29 AM  Result Value Ref Range   Phosphorus 4.5 2.5 - 4.6 mg/dL    Comment: Performed at [facility], [place] [address], [place], [place] [address]  Potassium     Status: None   Collection Time: 09/12/17  3:29 AM  Result Value Ref Range   Potassium 4.0 3.5 - 5.1 mmol/L    Comment: Performed at [facility], [place] [address], [place], [place] [address]    Imaging / Studies: Dg Lumbar Spine Complete  Result Date: 09/10/2017 CLINICAL DATA:  Pain aggravated by sitting, fell in bathroom 3 days ago, generalized RIGHT hip pain, midline lower back pain, and bottom pain EXAM: LUMBAR SPINE - COMPLETE 4+ VIEW COMPARISON:  CT abdomen and pelvis 08/31/2017 FINDINGS: Osseous demineralization. Five non-rib-bearing lumbar vertebra. Multilevel disc space narrowing. Vertebral body heights maintained. No acute fracture, subluxation, or bone destruction. SI joints preserved. BILATERAL nephrostomy tubes. Numerous pelvic surgical clips as well as RIGHT upper quadrant surgical clips. IMPRESSION: Osseous demineralization with mild degenerative disc disease changes of the lumbar spine. No acute abnormalities. Electronically Signed   By: [name] M.D.   On: 09/10/2017 20:38   Dg Sacrum/coccyx  Result Date: 09/10/2017 CLINICAL DATA:  Pain aggravated by sitting, fell in bathroom 3 days ago, generalized RIGHT hip pain, midline lower back pain, and bottom pain EXAM: SACRUM AND COCCYX - 2+ VIEW COMPARISON:  CT abdomen and pelvis 08/31/2017 FINDINGS: Minimal osteitis pubis. SI joints and sacral foramina symmetric. No sacrococcygeal fractures identified. Degenerative disc disease changes at L5-S1 with disc  space narrowing, minimal endplate spur formation and endplate sclerosis. IMPRESSION: No acute sacrococcygeal abnormalities. Degenerative disc disease changes at L5-S1. Electronically Signed   By: [name] M.D.   On: 09/10/2017 20:39   Dg Hip Unilat With Pelvis 1v Right  Result Date: 09/10/2017 CLINICAL DATA:  Pain aggravated by sitting, fell in bathroom 3 days ago, generalized RIGHT hip pain, midline lower back pain, and  bottom pain EXAM: DG HIP (WITH OR WITHOUT PELVIS) 1V RIGHT COMPARISON:  None FINDINGS: Hip and SI joints symmetric and preserved. Bones appear slightly demineralized. Minimal osteitis pubis. No acute fracture, dislocation, or bone destruction. IMPRESSION: No acute osseous abnormalities. Electronically Signed   By: [name] M.D.   On: 09/10/2017 20:45    Medications / Allergies: per chart  Antibiotics: Anti-infectives (From admission, onward)   Start     Dose/Rate Route Frequency Ordered Stop   08/30/17 1000  ciprofloxacin (CIPRO) IVPB 200 mg  Status:  Discontinued     200 mg 100 mL/hr over 60 Minutes Intravenous Every 24 hours 08/30/17 0855 09/06/17 0805        Note: Portions of this report may have been transcribed using voice recognition software. Every effort was made to ensure accuracy; however, inadvertent computerized transcription errors may be present.   Any transcriptional errors that result from this process are unintentional.     [name], M.D., F.A.C.S. Gastrointestinal and Minimally Invasive Surgery [facility] [address] [place],  [address] [phone] Main / Paging    09/12/2017

## 2017-09-12 NOTE — Progress Notes (Signed)
PROGRESS NOTE    [name]  ZOX:[id number] DOB: 01-19-1956 DOA: 08/29/2017 PCP: Ann Held, DO   Brief Narrative:   62 year old with a history of hyperlipidemia, diabetes mellitus type 2, hypothyroidism, idiopathic neutropenia, breast cancer status post bilateral mastectomy, ovarian and endometrial cancer status post colectomy in 2016 and takedown in August 2018 with ileocecal resection" and anal anastomosis and diverting loop ileostomy along with lysis of adhesion urethral stent exchange with psoas bladder hitch.  CT of her abdomen pelvis showed new colovesicular fistula therefore she was admitted and was also noted to have subacute renal failure.  Nephrology has been following.  Medicine team consulted for diabetes management  Assessment & Plan:   Principal Problem:   MDD (major depressive disorder), single episode, moderate (HCC) Active Problems:   Primary hypothyroidism   DM type 2 (diabetes mellitus, type 2) ([place])   History of ovarian & endometrial cancer   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 s/p resection/partial vaginectomy/ LAR/colostomy 11/19/2014   Morbid obesity (HCC)   Ureteral stricture, right, 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) 04/01/2017   Pelvic cancer s/p colostomy takedown/loop ileostomy diversion 04/01/2017   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 Would avoid aggressive management to prevent hypoglycemia until her diet is more consistent. Advised staff  to give patient chopped meats as she has an aversion to large pieces.  -Continue linagliptin 5 mg daily, Lantus 25 units twice daily, aspart 7 units pre-meals, sliding scale  Depression -currently on Zoloft and Lyrica (dose increased today to help with the pain), appreciate psychiatry input -Continue Zoloft -On Trazadone at night prn.   Hypothyroidism -Continue Synthroid  Colovesicular fistula -Management per urology and surgical team -Pain management per surgery. Lidocaine patch added which she states has helped her a little.   AKI on CKD stage III-4 - Appears to be around her baseline of 2.6.  Closely monitor this.  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   Moderate to severe protein calorie malnutrution  -Her Prealbumin level is quite low. Need to encourage po diet, nutrition consult if needed.   Currently pending placement   Consultants:   Surgery  Urology  Nephrology  Procedures:  09/02/2017: Bilateral nephrostomy tube placement 08/31/2017 CT abdomen pelvis without contrast IMPRESSION: 1. Rectovesical fistula, as above. 2. 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 3. Reflux of contrast material into the right kidney via the right ureter with moderate atrophy of the right kidney. Nonobstructive calculi in the lower pole collecting system of the left kidney measuring up to 11 mm.  4. Additional incidental findings, as above. 08/30/2017 renal until ultrasound: IMPRESSION: 1. Small nonobstructive calculi in the collecting systems of both kidneys. 2. Mild to moderate hydronephrosis.    Subjective: Patient was walking around in the hallway with ehr husband  when I saw her  and was doing quite well but still have difficulties due to pain.  Reports her pain on the right is little different than she usually has had but lidocaine patch has helped.  Family raises some concerns about her wounds which are not being thoroughly addressed and her diet. She states she doesn't like large pieces of meat and would like smaller pieces   Objective: Vitals:   09/11/17 1309 09/11/17 2315 09/12/17 0100 09/12/17 0542  BP: (!) 151/73 121/74  (!) 134/49  Pulse: 87 78  71  Resp: 16 18  18   Temp: 98.8 F (37.1 C) 99.1 F (37.3 C)  98.9 F (37.2 C)  TempSrc: Oral Oral  Oral  SpO2: 100% 100%  100%  Weight:   76.2 kg (168 lb)   Height:        Intake/Output Summary (Last 24 hours) at 09/12/2017 1409 Last data filed at 09/12/2017 1309 Gross per 24 hour  Intake 780 ml  Output 3500 ml  Net -2720 ml   Filed Weights   09/09/17 0600 09/10/17 0540 09/12/17 0100  Weight: 86.2 kg (190 lb 0.6 oz) 86.1 kg (189 lb 13.1 oz) 76.2 kg (168 lb)    Examination:  General exam: Calm and comfortable this morning.  Respiratory system: Clear to auscultation. Respiratory effort normal. Cardiovascular system: S1 & S2 heard, RRR. No JVD, murmurs, rubs, gallops or clicks. No pedal edema. Gastrointestinal system: Abdomen is soft and nontender, ostomy site looks intact, nephrostomy site looks ok.  Central nervous system: Alert and oriented. No focal neurological deficits. Extremities: Symmetric 5 x 5 power. Skin: No rashes, lesions or ulcers Psychiatry: Overall appears to depressed    Data Reviewed:   CBC: Recent Labs  Lab 09/07/17 0324 09/08/17 0355  WBC 4.2 2.4*  HGB 8.2* 7.7*  HCT 26.1* 25.2*  MCV 90.9 92.6  PLT 321 947   Basic Metabolic Panel: Recent Labs  Lab 09/06/17 0348 09/07/17 0324 09/08/17 0355 09/09/17 0457 09/12/17 0329  NA 131* 131* 134* 131*  --   K 3.7 3.7 3.8 3.9 4.0  CL 101 102 107 105  --   CO2 22 21* 20* 18*  --   GLUCOSE 156* 114* 150* 228*  --   BUN  48* 47* 51* 47*  --   CREATININE 3.23* 3.23* 2.88* 2.65* 2.47*  CALCIUM 8.4* 8.5* 8.5* 8.4*  --   MG 1.4* 2.1 1.7 1.3* 1.1*  PHOS 3.5 4.2 5.1* 4.1 4.5   GFR: Estimated Creatinine Clearance: 22.3 mL/min (A) (by C-G formula based on SCr of 2.47 mg/dL (H)). Liver Function Tests: Recent Labs  Lab 09/06/17 0348 09/07/17 0324 09/08/17 0355 09/09/17 0457  ALBUMIN 1.6* 1.9* 1.8* 1.8*   No results for input(s): LIPASE, AMYLASE in the last 168 hours. No results for input(s): AMMONIA in the last 168 hours. Coagulation Profile: No results for input(s): INR, PROTIME in the last 168 hours. Cardiac Enzymes: No results for input(s): CKTOTAL, CKMB, CKMBINDEX, TROPONINI in the last 168 hours. BNP (last 3 results) No results for input(s): PROBNP in the last 8760 hours. HbA1C: No results for input(s): HGBA1C in the last 72 hours. CBG: Recent Labs  Lab 09/11/17 1323 09/11/17 1632 09/11/17 2244 09/12/17 0734 09/12/17 1152  GLUCAP 168* 103* 190* 143* 124*   Lipid Profile: No results for input(s): CHOL, HDL, LDLCALC, TRIG, CHOLHDL, LDLDIRECT in the last 72 hours. Thyroid Function Tests: No results for input(s): TSH, T4TOTAL, FREET4, T3FREE, THYROIDAB in the last 72 hours.  Anemia Panel: No results for input(s): VITAMINB12, FOLATE, FERRITIN, TIBC, IRON, RETICCTPCT in the last 72 hours. Sepsis Labs: No results for input(s): PROCALCITON, LATICACIDVEN in the last 168 hours.  Recent Results (from the past 240 hour(s))  Urine Culture     Status: Abnormal   Collection Time: 09/02/17  5:07 PM  Result Value Ref Range Status   Specimen Description   Final    URINE, RANDOM KIDNEY RIGHT Performed at [facility] [address], [place], [place] [address]    Special Requests   Final    NONE Performed at [facility], [place] [address], [place], [place] [address]    Culture (A)  Final    40,000 COLONIES/mL SERRATIA MARCESCENS 20,000 COLONIES/mL GROUP B  STREP(S.AGALACTIAE)ISOLATED TESTING AGAINST S. AGALACTIAE NOT ROUTINELY PERFORMED DUE TO PREDICTABILITY OF AMP/PEN/VAN SUSCEPTIBILITY. >=100,000 COLONIES/mL LACTOBACILLUS SPECIES Standardized susceptibility testing for this organism is not available. 50,000 COLONIES/mL YEAST    Report Status 09/05/2017 FINAL  Final   Organism ID, Bacteria SERRATIA MARCESCENS (A)  Final      Susceptibility   Serratia marcescens - MIC*    CEFAZOLIN >=64 RESISTANT Resistant     CEFTRIAXONE <=1 SENSITIVE Sensitive     CIPROFLOXACIN 1 SENSITIVE Sensitive     GENTAMICIN <=1 SENSITIVE Sensitive     NITROFURANTOIN 256 RESISTANT Resistant     TRIMETH/SULFA <=20 SENSITIVE Sensitive     * 40,000 COLONIES/mL SERRATIA MARCESCENS  Urine Culture     Status: Abnormal   Collection Time: 09/02/17  5:07 PM  Result Value Ref Range Status   Specimen Description URINE, RANDOM  Final   Special Requests   Final    NONE Performed at [facility], [place] [address] [place], [place] [address]    Culture 80,000 COLONIES/mL SERRATIA MARCESCENS (A)  Final   Report Status 09/05/2017 FINAL  Final   Organism ID, Bacteria SERRATIA MARCESCENS (A)  Final      Susceptibility   Serratia marcescens - MIC*    CEFAZOLIN >=64 RESISTANT Resistant     CEFTRIAXONE <=1 SENSITIVE Sensitive     CIPROFLOXACIN 1 SENSITIVE Sensitive     GENTAMICIN <=1 SENSITIVE Sensitive     NITROFURANTOIN 256 RESISTANT Resistant     TRIMETH/SULFA <=20 SENSITIVE Sensitive     * 80,000 COLONIES/mL SERRATIA MARCESCENS         Radiology Studies: Dg Lumbar Spine Complete  Result Date: 09/10/2017 CLINICAL DATA:  Pain aggravated by sitting, fell in bathroom 3 days ago, generalized RIGHT hip pain, midline lower back pain, and bottom pain EXAM: LUMBAR SPINE - COMPLETE 4+ VIEW COMPARISON:  CT abdomen and pelvis 08/31/2017 FINDINGS: Osseous demineralization. Five non-rib-bearing lumbar vertebra. Multilevel disc space narrowing. Vertebral body heights  maintained. No acute fracture, subluxation, or bone destruction. SI joints preserved. BILATERAL nephrostomy tubes. Numerous pelvic surgical clips as well as RIGHT upper quadrant surgical clips. IMPRESSION: Osseous demineralization with mild degenerative disc disease changes of the lumbar spine. No acute abnormalities. Electronically Signed   By: [name] M.D.   On: 09/10/2017 20:38   Dg Sacrum/coccyx  Result Date: 09/10/2017 CLINICAL DATA:  Pain aggravated by sitting, fell in bathroom 3 days ago, generalized RIGHT hip pain, midline lower back pain, and bottom pain EXAM: SACRUM AND COCCYX - 2+ VIEW COMPARISON:  CT abdomen and pelvis 08/31/2017 FINDINGS: Minimal osteitis pubis. SI joints and sacral foramina symmetric. No sacrococcygeal fractures identified. Degenerative disc disease changes at L5-S1 with disc space narrowing, minimal endplate  spur formation and endplate sclerosis. IMPRESSION: No acute sacrococcygeal abnormalities. Degenerative disc disease changes at L5-S1. Electronically Signed   By: [name] M.D.   On: 09/10/2017 20:39   Dg Hip Unilat With Pelvis 1v Right  Result Date: 09/10/2017 CLINICAL DATA:  Pain aggravated by sitting, fell in bathroom 3 days ago, generalized RIGHT hip pain, midline lower back pain, and bottom pain EXAM: DG HIP (WITH OR WITHOUT PELVIS) 1V RIGHT COMPARISON:  None FINDINGS: Hip and SI joints symmetric and preserved. Bones appear slightly demineralized. Minimal osteitis pubis. No acute fracture, dislocation, or bone destruction. IMPRESSION: No acute osseous abnormalities. Electronically Signed   By: [name] M.D.   On: 09/10/2017 20:45        Scheduled Meds: . acetaminophen  1,000 mg Oral TID  . anastrozole  1 mg Oral Daily  . artificial tears   Both Eyes QHS  . cholecalciferol  10,000 Units Oral Weekly  . enoxaparin (LOVENOX) injection  30 mg Subcutaneous Q24H  . famotidine  20 mg Oral Daily  . ferrous sulfate  325 mg Oral BID WC  . Gerhardt's butt  cream   Topical BID  . insulin aspart  0-20 Units Subcutaneous TID WC  . insulin aspart  0-5 Units Subcutaneous QHS  . insulin aspart  7 Units Subcutaneous TID WC  . insulin glargine  25 Units Subcutaneous BID  . levothyroxine  150 mcg Oral QAC breakfast  . lidocaine  1 patch Transdermal Q24H  . linagliptin  5 mg Oral Daily  . lip balm  1 application Topical BID  . loratadine  10 mg Oral q morning - 10a  . polyvinyl alcohol  1 drop Both Eyes QHS  . pregabalin  100 mg Oral Daily  . prenatal multivitamin  1 tablet Oral Daily  . protein supplement shake  11 oz Oral BID BM  . psyllium  1 packet Oral Daily  . rosuvastatin  10 mg Oral QPM  . sertraline  50 mg Oral Daily  . Tbo-Filgrastim  480 mcg Subcutaneous Weekly   Continuous Infusions:   LOS: 14 days    Time spent: 35 mins     Ankit Arsenio Loader, MD [facility] Pager [phone]   If 7PM-7AM, please contact night-coverage [email or web address] Password [facility] 09/12/2017, 2:09 PM

## 2017-09-12 NOTE — Progress Notes (Signed)
PT Cancellation Note  Patient Details Name: Latese Dufault MRN: [id number] DOB: 1955-08-15   Cancelled Treatment:    Reason Eval/Treat Not Completed: Fatigue/lethargy limiting ability to participate. Pt had just walked around unit, with family, and returned to bed. Will check back anothe day.    Weston Anna, MPT Pager: [phone]

## 2017-09-12 NOTE — Progress Notes (Signed)
OT Cancellation Note  Patient Details Name: [name] MRN: [id number] DOB: 08/26/1955   Cancelled Treatment:    Reason Eval/Treat Not Completed: Fatigue/lethargy limiting ability to participate   Fatigue/lethargy limiting ability to participate. Pt had just walked around unit, with family, and returned to bed. Will check back anothe day.  [name], [place]    [name] 09/12/2017, 3:19 PM

## 2017-09-13 ENCOUNTER — Inpatient Hospital Stay (HOSPITAL_COMMUNITY): Payer: BLUE CROSS/BLUE SHIELD

## 2017-09-13 ENCOUNTER — Encounter (HOSPITAL_COMMUNITY): Payer: Self-pay | Admitting: Interventional Radiology

## 2017-09-13 DIAGNOSIS — R390 Extravasation of urine: Secondary | ICD-10-CM

## 2017-09-13 DIAGNOSIS — R1900 Intra-abdominal and pelvic swelling, mass and lump, unspecified site: Secondary | ICD-10-CM

## 2017-09-13 DIAGNOSIS — Z515 Encounter for palliative care: Secondary | ICD-10-CM

## 2017-09-13 HISTORY — PX: IR NEPHROSTOGRAM RIGHT THRU EXISTING ACCESS: IMG6062

## 2017-09-13 LAB — CBC WITH DIFFERENTIAL/PLATELET
BASOS ABS: 0 10*3/uL (ref 0.0–0.1)
Basophils Relative: 1 %
EOS ABS: 0.2 10*3/uL (ref 0.0–0.7)
Eosinophils Relative: 4 %
HEMATOCRIT: 25.5 % — AB (ref 36.0–46.0)
HEMOGLOBIN: 8 g/dL — AB (ref 12.0–15.0)
Lymphocytes Relative: 18 %
Lymphs Abs: 0.8 10*3/uL (ref 0.7–4.0)
MCH: 29.9 pg (ref 26.0–34.0)
MCHC: 31.4 g/dL (ref 30.0–36.0)
MCV: 95.1 fL (ref 78.0–100.0)
MONOS PCT: 22 %
Monocytes Absolute: 0.9 10*3/uL (ref 0.1–1.0)
NEUTROS ABS: 2.4 10*3/uL (ref 1.7–7.7)
NEUTROS PCT: 55 %
Platelets: 436 10*3/uL — ABNORMAL HIGH (ref 150–400)
RBC: 2.68 MIL/uL — ABNORMAL LOW (ref 3.87–5.11)
RDW: 16.2 % — ABNORMAL HIGH (ref 11.5–15.5)
WBC: 4.3 10*3/uL (ref 4.0–10.5)

## 2017-09-13 LAB — CREATININE, SERUM
Creatinine, Ser: 2.32 mg/dL — ABNORMAL HIGH (ref 0.44–1.00)
GFR calc non Af Amer: 21 mL/min — ABNORMAL LOW (ref >60)
GFR, EST AFRICAN AMERICAN: 25 mL/min — AB (ref >60)

## 2017-09-13 LAB — POTASSIUM: POTASSIUM: 3.7 mmol/L (ref 3.5–5.1)

## 2017-09-13 LAB — GLUCOSE, CAPILLARY
GLUCOSE-CAPILLARY: 96 mg/dL (ref 65–99)
Glucose-Capillary: 253 mg/dL — ABNORMAL HIGH (ref 65–99)
Glucose-Capillary: 141 mg/dL — ABNORMAL HIGH (ref 65–99)
Glucose-Capillary: 164 mg/dL — ABNORMAL HIGH (ref 65–99)

## 2017-09-13 LAB — MAGNESIUM: MAGNESIUM: 2 mg/dL (ref 1.7–2.4)

## 2017-09-13 MED ORDER — HYDROMORPHONE HCL 2 MG PO TABS
4.0000 mg | ORAL_TABLET | ORAL | Status: DC | PRN
  Administered 2017-09-13 – 2017-09-15 (×12): 4 mg via ORAL
  Filled 2017-09-13 (×12): qty 2

## 2017-09-13 MED ORDER — HYDROMORPHONE HCL 1 MG/ML IJ SOLN
1.0000 mg | Freq: Once | INTRAMUSCULAR | Status: AC
  Administered 2017-09-13: 1 mg via INTRAVENOUS

## 2017-09-13 MED ORDER — HYDROMORPHONE HCL 1 MG/ML IJ SOLN
1.0000 mg | INTRAMUSCULAR | Status: DC | PRN
  Administered 2017-09-13 – 2017-09-16 (×10): 1 mg via INTRAVENOUS
  Filled 2017-09-13 (×10): qty 1

## 2017-09-13 MED ORDER — FENTANYL 50 MCG/HR TD PT72
50.0000 ug | MEDICATED_PATCH | TRANSDERMAL | Status: DC
  Administered 2017-09-13: 50 ug via TRANSDERMAL
  Filled 2017-09-13: qty 1

## 2017-09-13 MED ORDER — IOPAMIDOL (ISOVUE-300) INJECTION 61%
INTRAVENOUS | Status: AC
  Administered 2017-09-13: 15 mL
  Filled 2017-09-13: qty 50

## 2017-09-13 MED ORDER — FERROUS SULFATE 325 (65 FE) MG PO TABS
325.0000 mg | ORAL_TABLET | Freq: Every day | ORAL | Status: DC
  Administered 2017-09-14 – 2017-09-24 (×11): 325 mg via ORAL
  Filled 2017-09-13 (×12): qty 1

## 2017-09-13 MED ORDER — PSYLLIUM 95 % PO PACK
1.0000 | PACK | Freq: Two times a day (BID) | ORAL | Status: DC
  Administered 2017-09-13 – 2017-09-24 (×5): 1 via ORAL
  Filled 2017-09-13 (×18): qty 1

## 2017-09-13 MED ORDER — IOPAMIDOL (ISOVUE-300) INJECTION 61%
10.0000 mL | Freq: Once | INTRAVENOUS | Status: AC | PRN
  Administered 2017-09-13: 15 mL

## 2017-09-13 MED ORDER — ACETAMINOPHEN 500 MG PO TABS
1000.0000 mg | ORAL_TABLET | Freq: Three times a day (TID) | ORAL | Status: DC
  Administered 2017-09-13 – 2017-09-25 (×45): 1000 mg via ORAL
  Filled 2017-09-13 (×48): qty 2

## 2017-09-13 MED ORDER — HYDROMORPHONE HCL 1 MG/ML IJ SOLN: 1.0000 mg | INTRAMUSCULAR | Status: DC | PRN

## 2017-09-13 MED ORDER — HYDROMORPHONE HCL 1 MG/ML IJ SOLN
1.0000 mg | INTRAMUSCULAR | Status: DC | PRN
  Administered 2017-09-13: 1 mg via INTRAVENOUS
  Filled 2017-09-13 (×2): qty 1

## 2017-09-13 NOTE — Consult Note (Signed)
[place] Nurse ostomy follow up Called unit and nursing tech reports that pouching system applied this afternoon is still intact.   [place] nursing team will follow, and will remain available to this patient, the nursing and medical teams.   Thanks, Maudie Flakes, MSN, RN, [place], Arther Abbott  Pager# [phone]

## 2017-09-13 NOTE — Consult Note (Signed)
Palliative care consult note  Reason for consult: Pain management related to malignancy  Chart reviewed, discussed case with bedside RN.  I met today with [name] and her sister-in-law, [name].    In brief, she is a 62 year old female with past history of diabetes mellitus type 2, hypothyroidism, breast cancer status post bilateral mastectomy, idiopathic neutropenia, ovarian and endometrial cancer status post colectomy in 2016 with takedown in August 2018 with ileocecal resection anal anastomosis with diverting loop ileostomy and lysis of adhesions with ureteral stent exchange with psoas bladder hitch.  Palliative consulted to assist in pain management.  [name] reports that she continues to have pain in her back near site of percutaneous nephrostomy tube placement.  She describes the pain is intense, 10 out of 10, minor radiation across to her back, relieved partially by IV pain medications (goes to 5 out of 10 after receiving IV Dilaudid), no other relieving factors, and exacerbated by certain movements.  I reviewed her pain medication usage for the last 24 hours.  She is received 4 mg of IV Dilaudid as well as 12 mg of p.o. Dilaudid.  This is equivalent to 128 mg of oral morphine over the last 24 hours.  In the 24 hours prior to this, she received Dilaudid in the equivalent of 240 mg of oral morphine.  I discussed with her regarding the usage of pain medications and the fact that she is needing them around the clock.  I think she would therefore best be served by initiation of a long-acting medication.  With her recent renal failure and current creatinine of greater than 2, I would not recommend MS Contin.  She also has a bad reaction to oxycodone per her report.  I would therefore recommend initiation of fentanyl patch at 50 mcg an hour.  We will continue with Dilaudid 4 mg p.o. for breakthrough medication, although, this may need to be increased based upon her response to medications  overnight.  In addition, I left a 1 mg dose of IV Dilaudid to be used 48 minutes after p.o. medication to p.o. route is ineffective.  She reports understanding plan for pain management and also understands that fentanyl patch will take between 12 and 24 hours to gauge if this dose is effective.  I will plan to follow-up again with her tomorrow.  Total time: 60 minutes Greater than 50%  of this time was spent counseling and coordinating care related to the above assessment and plan.  [name] Rough, MD [place] Team [phone]

## 2017-09-13 NOTE — Progress Notes (Signed)
OT Cancellation Note  Patient Details Name: [name] MRN: [id number] DOB: 01/16/1956   Cancelled Treatment:    Reason Eval/Treat Not Completed: Other (comment)   Pts drain leaking and waiting on wound nurse.  Will check back on pt next day [name], [name] [name] 09/13/2017, 2:04 PM

## 2017-09-13 NOTE — Progress Notes (Signed)
Patient was encouraged to turn on her side and turn side at change of shift to side but she refused.  Will keep encourage her to turn during this shift.

## 2017-09-13 NOTE — Progress Notes (Signed)
PROGRESS NOTE    [name]  CZY:[id number] DOB: 11-12-1955 DOA: 08/29/2017 PCP: Ann Held, DO   Brief Narrative:   62 year old with a history of hyperlipidemia, diabetes mellitus type 2, hypothyroidism, idiopathic neutropenia, breast cancer status post bilateral mastectomy, ovarian and endometrial cancer status post colectomy in 2016 and takedown in August 2018 with ileocecal resection" and anal anastomosis and diverting loop ileostomy along with lysis of adhesion urethral stent exchange with psoas bladder hitch.  CT of her abdomen pelvis showed new colovesicular fistula therefore she was admitted and was also noted to have subacute renal failure.  Nephrology has been following.  Medicine team consulted for diabetes management  Assessment & Plan:   Principal Problem:   MDD (major depressive disorder), single episode, moderate (HCC) Active Problems:   Primary hypothyroidism   DM type 2 (diabetes mellitus, type 2) ([place])   History of ovarian & endometrial cancer   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 s/p resection/partial vaginectomy/ LAR/colostomy 11/19/2014   Morbid obesity (HCC)   Ureteral stricture, right, 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) 04/01/2017   Pelvic cancer s/p colostomy takedown/loop ileostomy diversion 04/01/2017   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 Her diet is still inconsistent. Will cont current regiment.  -Continue linagliptin 5 mg daily, Lantus 25 units twice daily,  aspart 7 units pre-meals, sliding scale  Depression -currently on Zoloft and Lyrica (dose increased today to help with the pain), appreciate psychiatry input -Continue Zoloft -On Trazadone at night prn.   Hypothyroidism -Continue Synthroid  Colovesicular fistula -Management per urology and surgical team -Pain control per surgery, [facility] has been consulted to assist with this. Awaitign their input. Continue wound care management at this time.   AKI on CKD stage III-4 - Appears to be around her baseline of 2.32.  Closely monitor this.  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   Moderate to severe protein calorie malnutrution  -Her Prealbumin level is quite low. Need to encourage po diet, nutrition consult if needed.   Currently pending placement   Consultants:   Surgery  Urology  Nephrology  Procedures:  09/02/2017: Bilateral nephrostomy tube placement 08/31/2017 CT abdomen pelvis without contrast IMPRESSION: 1. Rectovesical fistula, as above. 2. 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 3. Reflux of contrast material into the right kidney via the right ureter with moderate atrophy of the right kidney. Nonobstructive calculi in the lower pole collecting system of the left kidney measuring up to 11 mm.  4. Additional incidental findings, as above. 08/30/2017 renal until ultrasound: IMPRESSION: 1. Small nonobstructive calculi in the collecting systems of both kidneys. 2. Mild to moderate hydronephrosis.    Subjective: Patient was not in her room, chart reviewed.  Will attempt to see her later   Objective: Vitals:   09/12/17 0542  09/12/17 1400 09/12/17 2116  09/13/17 0509  BP: (!) 134/49 (!) 133/58 (!) 124/52 136/65  Pulse: 71 79 87 75  Resp: 18 18 17 16   Temp: 98.9 F (37.2 C) 98.7 F (37.1 C) 99.5 F (37.5 C) 98 F (36.7 C)  TempSrc: Oral Oral Oral Oral  SpO2: 100% 100% 100% 100%  Weight:    88 kg (194 lb)  Height:        Intake/Output Summary (Last 24 hours) at 09/13/2017 1324 Last data filed at 09/13/2017 1048 Gross per 24 hour  Intake 490 ml  Output 3030 ml  Net -2540 ml   Filed Weights   09/10/17 0540 09/12/17 0100 09/13/17 0509  Weight: 86.1 kg (189 lb 13.1 oz) 76.2 kg (168 lb) 88 kg (194 lb)    Examination:  Unable to perform as patient is not in [place] room.     Data Reviewed:   CBC: Recent Labs  Lab 09/07/17 0324 09/08/17 0355 09/13/17 0335  WBC 4.2 2.4* 4.3  NEUTROABS  --   --  2.4  HGB 8.2* 7.7* 8.0*  HCT 26.1* 25.2* 25.5*  MCV 90.9 92.6 95.1  PLT 321 333 034*   Basic Metabolic Panel: Recent Labs  Lab 09/07/17 0324 09/08/17 0355 09/09/17 0457 09/12/17 0329 09/13/17 0335  NA 131* 134* 131*  --   --   K 3.7 3.8 3.9 4.0 3.7  CL 102 107 105  --   --   CO2 21* 20* 18*  --   --   GLUCOSE 114* 150* 228*  --   --   BUN 47* 51* 47*  --   --   CREATININE 3.23* 2.88* 2.65* 2.47* 2.32*  CALCIUM 8.5* 8.5* 8.4*  --   --   MG 2.1 1.7 1.3* 1.1* 2.0  PHOS 4.2 5.1* 4.1 4.5  --    GFR: Estimated Creatinine Clearance: 25.6 mL/min (A) (by C-G formula based on SCr of 2.32 mg/dL (H)). Liver Function Tests: Recent Labs  Lab 09/07/17 0324 09/08/17 0355 09/09/17 0457  ALBUMIN 1.9* 1.8* 1.8*   No results for input(s): LIPASE, AMYLASE in the last 168 hours. No results for input(s): AMMONIA in the last 168 hours. Coagulation Profile: No results for input(s): INR, PROTIME in the last 168 hours. Cardiac Enzymes: No results for input(s): CKTOTAL, CKMB, CKMBINDEX, TROPONINI in the last 168 hours. BNP (last 3 results) No results for input(s): PROBNP in the last 8760 hours. HbA1C: No results for input(s): HGBA1C  in the last 72 hours. CBG: Recent Labs  Lab 09/12/17 1152 09/12/17 1745 09/12/17 2118 09/13/17 0741 09/13/17 1206  GLUCAP 124* 107* 153* 96 253*   Lipid Profile: No results for input(s): CHOL, HDL, LDLCALC, TRIG, CHOLHDL, LDLDIRECT in the last 72 hours. Thyroid Function Tests: No results for input(s): TSH, T4TOTAL, FREET4, T3FREE, THYROIDAB in the last 72 hours. Anemia Panel: No results for input(s): VITAMINB12, FOLATE, FERRITIN, TIBC, IRON, RETICCTPCT in the last 72 hours. Sepsis Labs: No results for input(s): PROCALCITON, LATICACIDVEN in the last 168 hours.  No results found for this or any previous visit (from the past 240 hour(s)).       Radiology Studies: No results found.      Scheduled Meds: . acetaminophen  1,000 mg Oral TID WC & HS  . anastrozole  1 mg Oral Daily  . artificial tears   Both Eyes QHS  . cholecalciferol  10,000 Units Oral Weekly  . enoxaparin (LOVENOX) injection  30 mg Subcutaneous Q24H  . famotidine  20 mg Oral Daily  . [START ON 09/14/2017] ferrous sulfate  325 mg Oral QHS  . Gerhardt's butt cream   Topical QID  . insulin aspart  0-20 Units Subcutaneous TID WC  . insulin aspart  0-5 Units Subcutaneous QHS  . insulin aspart  7 Units Subcutaneous TID WC  . insulin glargine  25 Units Subcutaneous BID  . levothyroxine  150 mcg Oral QAC breakfast  . lidocaine  1 patch Transdermal Q24H  . linagliptin  5 mg Oral Daily  . lip balm  1 application Topical BID  . polyvinyl alcohol  1 drop Both Eyes QHS  . pregabalin  100 mg Oral Daily  . prenatal multivitamin  1 tablet Oral Daily  . protein supplement shake  11 oz Oral BID BM  . psyllium  1 packet Oral BID  . rosuvastatin  10 mg Oral QPM  . sertraline  50 mg Oral Daily  . Tbo-Filgrastim  480 mcg Subcutaneous Weekly   Continuous Infusions:   LOS: 15 days    Time spent: 35 mins     Gurkaran Rahm Arsenio Loader, MD [facility] Pager [phone]   If 7PM-7AM, please contact  night-coverage [email or web address] Password [email or web address] 09/13/2017, 1:24 PM

## 2017-09-13 NOTE — Progress Notes (Signed)
Patient ID: [name], female   DOB: Dec 04, 1955, 62 y.o.   MRN: [id number]    Referring Physician(s): Dr. Michael [name]  Supervising Physician: Aletta Edouard  Patient Status: [name] - In-pt  Chief Complaint: B PCNs  Subjective: Patient states her right-sided pain is worse again today.  Her drain was just emptied.    Allergies: Penicillins; Ultram [tramadol]; Adhesive [tape]; Cefaclor; Erythromycin; Trimethoprim; Ciprofloxacin; Fluconazole; Oxycodone; Pectin; and Sulfa antibiotics  Medications: Prior to Admission medications   Medication Sig Start Date End Date Taking? Authorizing Provider  anastrozole (ARIMIDEX) 1 MG tablet TAKE 1 TABLET DAILY 12/08/16  Yes Gorsuch, Ni, MD  Biotin 5 MG TABS Take 5 mg by mouth every morning.    Yes [provider]  Cholecalciferol (VITAMIN D3) 10000 UNITS capsule Take 10,000 Units by mouth once a week. Sunday evening's   Yes [provider]  diphenoxylate-atropine (LOMOTIL) 2.5-0.025 MG tablet Take 1-2 tablets by mouth every 8 (eight) hours. TO PREVENT LOOSE BOWEL MOVEMENTS 05/20/17  Yes [provider]  Famotidine-Ca Carb-Mag Hydrox (PEPCID COMPLETE PO) Take 1 tablet by mouth daily as needed (INDIGESTION).   Yes [provider]  filgrastim (NEUPOGEN) 480 MCG/1.6ML injection Inject 1.6 ml under the skin every 5 days for life Patient taking differently: Inject 480 mcg into the skin See admin instructions. Inject 1.6 ml under the skin every 6 days for life 01/06/17  Yes Gorsuch, Ni, MD  levothyroxine (SYNTHROID, LEVOTHROID) 150 MCG tablet Take 150 mcg by mouth daily before breakfast.    Yes [provider]  loratadine (CLARITIN) 10 MG tablet Take 10 mg by mouth every morning.    Yes [provider]  omega-3 acid ethyl esters (LOVAZA) 1 G capsule Take 1 g by mouth 2 (two) times daily.    Yes [provider]  ondansetron (ZOFRAN-ODT) 4 MG disintegrating tablet Take 4 mg by mouth every 6 (six) hours  as needed for nausea or vomiting.  04/12/17  Yes [provider]  Polyethyl Glycol-Propyl Glycol (SYSTANE OP) Place 1 drop into both eyes at bedtime.    Yes [provider]  Prenatal Vit-Fe Fumarate-FA (PRENATAL VITAMIN PO) Take 1 capsule by mouth daily. Takes prenatal because there are no dyes in it   Yes [provider]  prochlorperazine (COMPAZINE) 10 MG tablet Take 1 tablet (10 mg total) by mouth 2 (two) times daily as needed for nausea or vomiting. Patient taking differently: Take 10 mg by mouth 2 (two) times daily as needed for nausea or vomiting.  05/28/17  Yes Virgel Manifold, MD  promethazine (PHENERGAN) 25 MG tablet Take 1 tablet (25 mg total) by mouth every 6 (six) hours as needed for nausea or vomiting. 05/28/17  Yes Virgel Manifold, MD  ranitidine (ZANTAC) 150 MG tablet Take 150 mg by mouth 2 (two) times daily as needed for heartburn.    Yes [provider]  rosuvastatin (CRESTOR) 10 MG tablet Take 10 mg by mouth every evening.    Yes [provider]  sitaGLIPtin (JANUVIA) 100 MG tablet Take 100 mg by mouth every morning.    Yes [provider]  HYDROcodone-acetaminophen (NORCO/VICODIN) 5-325 MG tablet Take 1 tablet by mouth every 6 (six) hours as needed (Pain). Patient not taking: Reported on 08/29/2017 08/10/17   Shelda Pal, DO    Vital Signs: BP 136/65 (BP Location: Right Leg)   Pulse 75   Temp 98 F (36.7 C) (Oral)   Resp 16   Ht 5\' 2"  (1.575 m)  Wt 194 lb (88 kg)   SpO2 100%   BMI 35.48 kg/m   Physical Exam: Abd: B PCNs in place with clear yellow urine output.  Both sites appear to be intact, difficult to see today due to her positioning in her chair.  Left > right output.  Imaging: Dg Lumbar Spine Complete  Result Date: 09/10/2017 CLINICAL DATA:  Pain aggravated by sitting, fell in bathroom 3 days ago, generalized RIGHT hip pain, midline lower back pain, and bottom pain EXAM: LUMBAR SPINE - COMPLETE 4+ VIEW  COMPARISON:  CT abdomen and pelvis 08/31/2017 FINDINGS: Osseous demineralization. Five non-rib-bearing lumbar vertebra. Multilevel disc space narrowing. Vertebral body heights maintained. No acute fracture, subluxation, or bone destruction. SI joints preserved. BILATERAL nephrostomy tubes. Numerous pelvic surgical clips as well as RIGHT upper quadrant surgical clips. IMPRESSION: Osseous demineralization with mild degenerative disc disease changes of the lumbar spine. No acute abnormalities. Electronically Signed   By: [name] M.D.   On: 09/10/2017 20:38   Dg Sacrum/coccyx  Result Date: 09/10/2017 CLINICAL DATA:  Pain aggravated by sitting, fell in bathroom 3 days ago, generalized RIGHT hip pain, midline lower back pain, and bottom pain EXAM: SACRUM AND COCCYX - 2+ VIEW COMPARISON:  CT abdomen and pelvis 08/31/2017 FINDINGS: Minimal osteitis pubis. SI joints and sacral foramina symmetric. No sacrococcygeal fractures identified. Degenerative disc disease changes at L5-S1 with disc space narrowing, minimal endplate spur formation and endplate sclerosis. IMPRESSION: No acute sacrococcygeal abnormalities. Degenerative disc disease changes at L5-S1. Electronically Signed   By: [name] M.D.   On: 09/10/2017 20:39   Dg Hip Unilat With Pelvis 1v Right  Result Date: 09/10/2017 CLINICAL DATA:  Pain aggravated by sitting, fell in bathroom 3 days ago, generalized RIGHT hip pain, midline lower back pain, and bottom pain EXAM: DG HIP (WITH OR WITHOUT PELVIS) 1V RIGHT COMPARISON:  None FINDINGS: Hip and SI joints symmetric and preserved. Bones appear slightly demineralized. Minimal osteitis pubis. No acute fracture, dislocation, or bone destruction. IMPRESSION: No acute osseous abnormalities. Electronically Signed   By: [name] M.D.   On: 09/10/2017 20:45    Labs:  CBC: Recent Labs    09/05/17 0433 09/07/17 0324 09/08/17 0355 09/13/17 0335  WBC 5.6 4.2 2.4* 4.3  HGB 8.2* 8.2* 7.7* 8.0*  HCT 25.0*  26.1* 25.2* 25.5*  PLT 272 321 333 436*    COAGS: Recent Labs    04/16/17 0047 09/02/17 0939  INR 1.21 1.21    BMP: Recent Labs    09/06/17 0348 09/07/17 0324 09/08/17 0355 09/09/17 0457 09/12/17 0329 09/13/17 0335  NA 131* 131* 134* 131*  --   --   K 3.7 3.7 3.8 3.9 4.0 3.7  CL 101 102 107 105  --   --   CO2 22 21* 20* 18*  --   --   GLUCOSE 156* 114* 150* 228*  --   --   BUN 48* 47* 51* 47*  --   --   CALCIUM 8.4* 8.5* 8.5* 8.4*  --   --   CREATININE 3.23* 3.23* 2.88* 2.65* 2.47* 2.32*  GFRNONAA 14* 14* 16* 18* 20* 21*  GFRAA 16* 16* 19* 21* 23* 25*    LIVER FUNCTION TESTS: Recent Labs    04/20/17 0510 04/21/17 0416 05/27/17 1724 08/29/17 1550  09/06/17 0348 09/07/17 0324 09/08/17 0355 09/09/17 0457  BILITOT 0.4 0.5 0.3 0.5  --   --   --   --   --   AST 13*  13* 24 16  --   --   --   --   --   ALT 10* 10* 33 24  --   --   --   --   --   ALKPHOS 177* 190* 166* 173*  --   --   --   --   --   PROT 5.8* 5.9* 9.2* 6.8  --   --   --   --   --   ALBUMIN 1.8* 1.8* 3.9 2.1*   < > 1.6* 1.9* 1.8* 1.8*   < > = values in this interval not displayed.    Assessment and Plan: 1. S/p B PCN placements for complicated urinary issues  Patient continues to have right-sided pain around her drain site since falling several days ago.  Discussed with Dr. Kathlene Cote.  Nephrostogram has been ordered to evaluate her drain to make sure her pain isn't coming from her PCN.  Electronically Signed: Henreitta Cea 09/13/2017, 11:19 AM   I spent a total of 15 Minutes at the the patient's bedside AND on the patient's hospital floor or unit, greater than 50% of which was counseling/coordinating care for ureteral strictures

## 2017-09-13 NOTE — Progress Notes (Addendum)
[address] [place], [place] [address] Phone: [phone]  FAX: [phone]      Sharlot Sturkey [id number] 1956-03-21  CARE TEAM:  PCP: Ann Held, DO  Outpatient Care Team: Patient Care Team: Carollee Herter, Alferd Apa, DO as PCP - General (Family Medicine) Heath Lark, MD as Consulting Physician (Hematology and Oncology) Everitt Amber, MD as Consulting Physician (Obstetrics and Gynecology) Michael [name], MD as Consulting Physician (General Surgery) Alexis Frock, MD as Consulting Physician (Urology) Altheimer, Legrand Como, MD as Consulting Physician (Endocrinology) Katy Apo, MD as Consulting Physician (Ophthalmology) Irene Limbo, MD as Consulting Physician (Plastic Surgery) Madelon Lips, MD as Consulting Physician (Nephrology)  Inpatient Treatment Team: Treatment Team: Attending Provider: Michael [name], MD; Consulting Physician: Alexis Frock, MD; Registered Nurse: Illene Regulus, RN; Technician: Reuel Derby, NT; Consulting Physician: Fatima Blank, MD; Consulting Physician: Michael [name], MD; Technician: Leda Quail, NT; Consulting Physician: Patrecia Pour, MD; Consulting Physician: Heath Lark, MD; Technician: Estrellita Ludwig, [place]; Rounding Team: Dorthy Cooler Radiology, MD; Technician: Sueanne Margarita, [place]; Consulting Physician: Faythe Dingwall, DO; Consulting Physician: Damita Lack, MD; Registered Nurse: Pennie Rushing, RN; Occupational Therapist: [name], OT; Technician: Rosalia Hammers, NT   Problem List:   Principal Problem:   MDD (major depressive disorder), single episode, moderate ([place]) Active Problems:   Chronic kidney disease (CKD), stage III (moderate) ([place])   Colovesical fistula - delayed   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 s/p resection/partial vaginectomy/ LAR/colostomy 11/19/2014   Ureteral stricture, right, s/p  resection/reimplantation into bladder (Heineke-Mikulicz with Psoas Hitch) 04/01/2017   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   Primary hypothyroidism   DM type 2 (diabetes mellitus, type 2) ([place])   History of ovarian & endometrial cancer   Morbid obesity ([place])   Pelvic cancer s/p colostomy takedown/loop ileostomy diversion 04/01/2017   Protein-calorie malnutrition, moderate ([facility])   Dehydration   ARF (acute renal failure) ([facility])   Adjustment disorder with mixed anxiety and depressed mood   Poorly controlled diabetes mellitus ([place])   Pressure injury of skin   History of external beam radiation therapy to pelvis  SURGERY 04/01/2017     POST-OPERATIVE DIAGNOSIS:   Recurrent endometrial cancer status post low anterior resection and partial pelvic exeneration  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   SURGEON:Edan Serratore Gwynneth Aliment, MD- Primary (Dr Tresa Moore assist) PROCEDURE:  XI ROBOTIC LYSIS OF ADHESIONS x 5 hours XI ROBOTIC COLOSTOMY TAKEDOWN ROBOTIC SEWN COLOANAL ANASTOMOSIS Ileocecal resection with anastomosis Diverting loop ileostomy  SURGEON: Alexis Frock, MD - Primary (Dr Johney Maine assist) PROCEDURE:  Cystoscopy with R retrograde and stent exchange.  Robotic R ureteral reimplant with psoas bladder hitch.  Consults: urology   SURGERY 08/17/2017  DIAGNOSIS:  Right ureteral stricture, status post complex reconstruction.  PROCEDURES: 1. Cystoscopy with right retrograde pyelogram. 2. Removal of right ureteral stent.  PHYSICIAN:  Alexis Frock, MD        SPECIMENS:  Right ureteral stone for discard, inspected and intact.  FINDINGS: 1. Fusiform bladder shape consistent with prior psoas hitch. 2. Visibly patent ureteral-bladder anastomosis. 3. No evidence of hydronephrosis or narrowing with right retrograde     pyelogram.    Assessment  Struggling with pain control  Delayed bladder leak turned into colovesical fistula with preferential urinary drainage in  decompressed diverted colon limb - diverted urine w bilateral nephrostomy tubes & foley.  .  Plan:   Inadequate pain control.  Still needing IV  pain meds.  Lidocaine patch may be helping pain on right back nephrostomy tube site.  Oral Dilaudid dose helps at 4 mg only.  Inc Scheduled Tylenol to QID.  Lyrica for chronic pain / depression - increased from 75 to 193m. Robaxin PRN.    Awaiting palliative care for help in pain management.  Pain at R back nephrostomy tube site - Xrays w/o fracture.  Change dressing more often.  D/w Dr MTresa Mooretoday.  He feels it is in good position w/o infection.  Agrees w foam pad for back & lidocaine patch.  Gluteal skin breakdown - agree w barrier cream.  Urinary diversion as much as possible - still not 100% with anal leakage.  Powders PRN.  GET HER UP to avoid deecubitus.  She hates the air bed, so maybe go back to foam egg crate if she is getting up more consistently. Try to avoid deeper decubitus formation    Discussed with urology, Dr. MTresa Moore  S/p percutaneous nephrostomy tubes to divert urine directly out of kidneys and out of the bladder that is draining into her neorectum/colon.  Still needing Foley catheter.  Still leaking urine output anus.  Somewhat improved but fair.  Suspect Ileal conduit imminent   Follow off antibiotics  Mag low - replaced.  K & Phos OK.  Follow  Zoloft for depression.  Psychiatric evaluation done - added trazadone QHS & rec PCP f/u.  Psych f/u PRN  Diabetes better controlled.  D/w TRH.  Continue lantus Bid & PO.  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  Iron deficiency anemia with borderline hemoglobin.  Given another dose of IV dextran this admission.  Oral iron - QHS.  She had difficulty absorbing and tolerating.  With urine leak better controlled, perhaps she can tolerate better now  Solid diet.  Bowel regimen.  Output under control w/o massive antidiarrheals.    Ostomy care - not major issues w  ileostomy  Continue neupogen injections for pancytopenia  VTE prophylaxis: SCDs, etc  Ambulate as tolerated to help recovery    Leak on the left lateral inferiolateral corner of the cystostomy Heineke-Mikulicz and psoas hitch needed for the bladder to reach the right ureter at that had the chronic severe distal stricture.  I am highly skeptical that a radiated bladder with a delayed leak will spontaneously heal.  She had internal Foley drainage, external surgical drainage, internal stenting drainage for a few months, yet this happened. Therefore, most likely will require surgical repair.  Standard of care usually on this situation is to resect colon & do a new distal anastomosis. Primarily repair the bladder & patch it.  However, there is no way I can redo anastomosis in a radiated pelvis with prior surgeries with prior anterior exoneration, radiation, 7-hour lysis adhesions and prior surgery when she was in better shape.  Only options I can see is to try & primarily patch the colon with OMENTOPEXY, primarily repair the bladder with OMENTOPEXY, and hope it will heal.  I am skeptical that it will.  Most likely we will have to convert to an end colostomy to get the colon away from it and allow the bladder to be patched.  I do agree with Dr. MTresa Moorethat she is rather malnourished and deconditioned at this point so would like to try and hold off on definitive repair if we can.   I suspect that her bladder is probably not salvageable and she may need a completion cystectomy with ileal  conduit.  I will defer to Dr. Zettie Pho expertise with urology.  We will discuss that later once she is in better shape to actually tolerate a reoperation should it come to that (very likely).  Home health nursing seems to have failed.  Lack of outpatient ostomy clinic in town here making outpatient management challenging.   I strongly suspect she will need a SNF.  Patient more motivated to get up and move around.  She is still  embarrassed in frustrated with persistent urine leak out anus.  But she is getting up some.  Work to try and transition to outpatient rehab with SNF next week.  Needs better pain control     35 minutes spent in review, evaluation, examination, counseling, and coordination of care.  More than 50% of that time was spent in counseling.  [name], M.D., F.A.C.S. Gastrointestinal and Minimally Invasive Surgery [facility] [address] [place], [place] [address] [phone] Main / Paging    09/13/2017    Subjective: (Chief complaint)  Crying C/o gluteal pain as worst spot Lidocaine may be helping.  PO Dilaudid not enough - needed IV Walked in hallway yesterday Up in chair Tolerating food OK.  Objective:  Vital signs:  Vitals:   09/12/17 0542 09/12/17 1400 09/12/17 2116 09/13/17 0509  BP: (!) 134/49 (!) 133/58 (!) 124/52 136/65  Pulse: 71 79 87 75  Resp: 18 18 17 16   Temp: 98.9 F (37.2 C) 98.7 F (37.1 C) 99.5 F (37.5 C) 98 F (36.7 C)  TempSrc: Oral Oral Oral Oral  SpO2: 100% 100% 100% 100%  Weight:    88 kg (194 lb)  Height:        Last BM Date: 09/12/17  Intake/Output   Yesterday:  02/11 0701 - 02/12 0700 In: 60 [P.O.:60] Out: 2580 [Urine:1650; Stool:930] This shift:  Total I/O In: -  Out: 350 [Urine:150; Stool:200]  Bowel function:  Flatus: YES   BM:  YES.  Thick oatmeal consistency succus in ileostomy bag.  Drain: No drains.     Physical Exam:  General: Pt awake/alert/oriented x4 in mild acute distress. Eyes: PERRL, normal EOM.  Sclera clear.  No icterus Neuro: CN II-XII intact w/o focal sensory/motor deficits. Lymph: No head/neck/groin lymphadenopathy Psych:  No delerium/psychosis/paranoia.  Crying & tired.  Somewhat consolable HENT: Normocephalic, Mucus membranes moist.  No thrush Neck: Supple, No tracheal deviation Chest: No chest wall pain w good excursion CV:  Pulses intact.  Regular  rhythm MS: Normal AROM mjr joints.  No obvious deformity.  Bilateral perc nephrostomy bags - R posterior sensitive but no cellultitis  Abdomen: Soft.  Nondistended.  No tenderness to palpation.  No evidence of peritonitis.  No incarcerated hernias.  No wounds.  Ileostomy pink with gas and succus in bag.      GU.  No purulent drainage or hematoma.  Foley in place with clear light yellow urine  Rectal deferred.  Last week had stricturing at coloanal anastomosis.  Had been finger dilated.  Held off on that today.  Gluteal skin breakdown but controlled w Buttpaste barrier cream. Ext:   No deformity.  No mjr edema.  No cyanosis Skin: No petechiae / purpura  Results:   Labs: Results for orders placed or performed during the hospital encounter of 08/29/17 (from the past 48 hour(s))  Glucose, capillary     Status: Abnormal   Collection Time: 09/11/17  1:23 PM  Result Value Ref Range   Glucose-Capillary  168 (H) 65 - 99 mg/dL  Glucose, capillary     Status: Abnormal   Collection Time: 09/11/17  4:32 PM  Result Value Ref Range   Glucose-Capillary 103 (H) 65 - 99 mg/dL  Glucose, capillary     Status: Abnormal   Collection Time: 09/11/17 10:44 PM  Result Value Ref Range   Glucose-Capillary 190 (H) 65 - 99 mg/dL  Creatinine, serum     Status: Abnormal   Collection Time: 09/12/17  3:29 AM  Result Value Ref Range   Creatinine, Ser 2.47 (H) 0.44 - 1.00 mg/dL   GFR calc non Af Amer 20 (L) >60 mL/min   GFR calc Af Amer 23 (L) >60 mL/min    Comment: (NOTE) The eGFR has been calculated using the CKD EPI equation. This calculation has not been validated in all clinical situations. eGFR's persistently <60 mL/min signify possible Chronic Kidney Disease. Performed at [facility], [place] [address] [place], [place] [address]   Magnesium     Status: Abnormal   Collection Time: 09/12/17  3:29 AM  Result Value Ref Range   Magnesium 1.1 (L) 1.7 - 2.4 mg/dL    Comment: Performed at  [facility], [place] [address], [place], [place] [address]  Phosphorus     Status: None   Collection Time: 09/12/17  3:29 AM  Result Value Ref Range   Phosphorus 4.5 2.5 - 4.6 mg/dL    Comment: Performed at [facility], [place] [address], [place], [place] [address]  Potassium     Status: None   Collection Time: 09/12/17  3:29 AM  Result Value Ref Range   Potassium 4.0 3.5 - 5.1 mmol/L    Comment: Performed at [facility], [place] [address], [place], [place] [address]  Glucose, capillary     Status: Abnormal   Collection Time: 09/12/17  7:34 AM  Result Value Ref Range   Glucose-Capillary 143 (H) 65 - 99 mg/dL  Prealbumin     Status: Abnormal   Collection Time: 09/12/17  9:03 AM  Result Value Ref Range   Prealbumin 9.4 (L) 18 - 38 mg/dL    Comment: Performed at [place] [address] [place], [place] [address]  Glucose, capillary     Status: Abnormal   Collection Time: 09/12/17 11:52 AM  Result Value Ref Range   Glucose-Capillary 124 (H) 65 - 99 mg/dL  Glucose, capillary     Status: Abnormal   Collection Time: 09/12/17  5:45 PM  Result Value Ref Range   Glucose-Capillary 107 (H) 65 - 99 mg/dL  Glucose, capillary     Status: Abnormal   Collection Time: 09/12/17  9:18 PM  Result Value Ref Range   Glucose-Capillary 153 (H) 65 - 99 mg/dL  Potassium     Status: None   Collection Time: 09/13/17  3:35 AM  Result Value Ref Range   Potassium 3.7 3.5 - 5.1 mmol/L    Comment: Performed at [facility], [place] [address], [place], [place] [address]  Creatinine, serum     Status: Abnormal   Collection Time: 09/13/17  3:35 AM  Result Value Ref Range   Creatinine, Ser 2.32 (H) 0.44 - 1.00 mg/dL   GFR calc non Af Amer 21 (L) >60 mL/min   GFR calc Af Amer 25 (L) >60 mL/min    Comment: (NOTE) The eGFR has been calculated using the CKD EPI equation. This calculation has not been validated in all  clinical  situations. eGFR's persistently <60 mL/min signify possible Chronic Kidney Disease. Performed at [facility], [place] [address] [place], [place] [address]   Magnesium     Status: None   Collection Time: 09/13/17  3:35 AM  Result Value Ref Range   Magnesium 2.0 1.7 - 2.4 mg/dL    Comment: Performed at [facility], [place] [address], [place], [place] [address]  CBC with Differential/Platelet     Status: Abnormal   Collection Time: 09/13/17  3:35 AM  Result Value Ref Range   WBC 4.3 4.0 - 10.5 K/uL   RBC 2.68 (L) 3.87 - 5.11 MIL/uL   Hemoglobin 8.0 (L) 12.0 - 15.0 g/dL   HCT 25.5 (L) 36.0 - 46.0 %   MCV 95.1 78.0 - 100.0 fL   MCH 29.9 26.0 - 34.0 pg   MCHC 31.4 30.0 - 36.0 g/dL   RDW 16.2 (H) 11.5 - 15.5 %   Platelets 436 (H) 150 - 400 K/uL   Neutrophils Relative % 55 %   Lymphocytes Relative 18 %   Monocytes Relative 22 %   Eosinophils Relative 4 %   Basophils Relative 1 %   Neutro Abs 2.4 1.7 - 7.7 K/uL   Lymphs Abs 0.8 0.7 - 4.0 K/uL   Monocytes Absolute 0.9 0.1 - 1.0 K/uL   Eosinophils Absolute 0.2 0.0 - 0.7 K/uL   Basophils Absolute 0.0 0.0 - 0.1 K/uL   WBC Morphology MILD LEFT SHIFT (1-5% METAS, OCC MYELO, OCC BANDS)     Comment: Performed at [facility], [place] [address], [place], [place] [address]  Glucose, capillary     Status: None   Collection Time: 09/13/17  7:41 AM  Result Value Ref Range   Glucose-Capillary 96 65 - 99 mg/dL    Imaging / Studies: No results found.  Medications / Allergies: per chart  Antibiotics: Anti-infectives (From admission, onward)   Start     Dose/Rate Route Frequency Ordered Stop   08/30/17 1000  ciprofloxacin (CIPRO) IVPB 200 mg  Status:  Discontinued     200 mg 100 mL/hr over 60 Minutes Intravenous Every 24 hours 08/30/17 0855 09/06/17 0805        Note: Portions of this report may have been transcribed using voice recognition software. Every effort was made to ensure  accuracy; however, inadvertent computerized transcription errors may be present.   Any transcriptional errors that result from this process are unintentional.     [name], M.D., F.A.C.S. Gastrointestinal and Minimally Invasive Surgery [facility] [address], [place],  [address] [phone] Main / Paging    09/13/2017

## 2017-09-13 NOTE — Progress Notes (Signed)
Subjective/Chief Complaint:   1 - Bladder Injury / Right Ureteral Stricture - s/p robotic RIGHT ureteral reimplant with psoas hitch + HM Flap 04/01/17 at time of colostomy take down / adhesiolysis, small bowel resection, loop iliostomy for right distal stricture / bladder injury sustained at pelvic resection for advanced endometrial cancer 2016. Stent removed 08/17/17. FU CT 08/25/17 with resolved right hydro.   2 - Metastatic Endometrial Cancer - s/p repeat resection [phone] with colon and vaginal involvement but negative nodes / margins. Had adjuvant chemo-XRT which she is now done with. Follows with Dr. Alvy Bimler.   3 - Acute on Chronic Renal Failure / Severe Dehydration - pt with high output iliostomy, difficult to maintain hydration, has been on PRN IVF. Cr 4's on admit late 08/2017 up from baseline <1.5. CT few days ago without hydro. Her BUN / Cr ratio has been 20 or more since iliostomy. Renal US1/29 with some mild (stable) caliectasis that is stable x months and no right hydro at all.  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   4 - Bacteruria / Malaise- pt with bacteruria on admit. No fevers / leukocytosis. Most recent clonal CX 07/2017 (when still had foley in place) serratia and enterococcus sens cipro. UCX 1/29 - serratia and treated with few days Cipro. Remains afebrile.   5 - Rectal - Bladder Fistula - some drainage of yellowiosh thin fluid from peri-rectal area with standing per report new late 08/2017. CT cystogram 1/30 confims NEW bladder neck - rectal fistula with large volume urine transit into GI tract distal to loop iliostomy. Out remained high even with foley drainage). Bilat nep tubes placed 09/02/17 with now approx 2/3 urine drianing per neph tuebs and 1/3 per foley and muc less per rectum.   PMH sig for DM2, morbid obesity, breast cancer, ovarian cancer,  endometrial cancer.    Today "[name]" is stable. Neph tube locks are bothering her back some due to pressure.    Objective: Vital signs in last 24 hours: Temp:  [98 F (36.7 C)-99.5 F (37.5 C)] 98 F (36.7 C) (02/12 0509) Pulse Rate:  [75-87] 75 (02/12 0509) Resp:  [16-18] 16 (02/12 0509) BP: (124-136)/(52-65) 136/65 (02/12 0509) SpO2:  [100 %] 100 % (02/12 0509) Weight:  [88 kg (194 lb)] 88 kg (194 lb) (02/12 0509) Last BM Date: 09/12/17  Intake/Output from previous day: 02/11 0701 - 02/12 0700 In: 60 [P.O.:60] Out: 2580 [Urine:1650; Stool:930] Intake/Output this shift: Total I/O In: -  Out: 350 [Urine:150; Stool:200]   General appearance: alert and with visible malaise and anxiety, husband at bedside as well.  Eyes: negative Nose: Nares normal. Septum midline. Mucosa normal. No drainage or sinus tenderness. Throat: lips, mucosa, and tongue normal; teeth and gums normal Neck: supple, symmetrical, trachea midline Back: symmetric, no curvature. ROM normal. No CVA tenderness. Bilat neph tubes in placed with clear yellos urine.  GI: stable large truncal obesity. Iliostomy patent of stool. Multiple scars w/o hernias.  Extremities: extremities normal, atraumatic, no cyanosis or edema Skin: Skin color, texture, turgor normal. No rashes or lesions Lymph nodes: Cervical, supraclavicular, and axillary nodes normal. Neurologic: Grossly normal FOley in place with yellow urine mixed with some mucus.   Lab Results:  Recent Labs    09/13/17 0335  WBC 4.3  HGB 8.0*  HCT 25.5*  PLT 436*   BMET Recent Labs    09/12/17 0329  09/13/17 0335  K 4.0 3.7  CREATININE 2.47* 2.32*   PT/INR No results for input(s): LABPROT, INR in the last 72 hours. ABG No results for input(s): PHART, HCO3 in the last 72 hours.  Invalid input(s): PCO2, PO2  Studies/Results: No results found.  Anti-infectives: Anti-infectives (From admission, onward)   Start     Dose/Rate Route Frequency  Ordered Stop   08/30/17 1000  ciprofloxacin (CIPRO) IVPB 200 mg  Status:  Discontinued     200 mg 100 mL/hr over 60 Minutes Intravenous Every 24 hours 08/30/17 0855 09/06/17 0805      Assessment/Plan:  1 - Bladder Injury / Right Ureteral Stricture - imaging post-repair favorable, less right hydro than in years which suggests succesfull repair. Her mild left caliectasis is chronic and stable accounting for different imaging modalities.   2 - Metastatic Endometrial Cancer - no evidence of active disease, per med-oncology.   3 - Acute on Chronic Renal Failure / Severe Dehydration - her acute BUN and Cr rise appears to be mostly pre-renal + exacerbation / artifact from systemic reabsorption via contact with colon via fistula. This is improved but not back to baseline with drainage away from fistula.   Discussed foam pad / mattress pad as likely best means to keep pressure of the necessary neph tube locks.   4 - Bacteruria - now s/p tretment. Observe.    5 - Rectal - Bladder Fistula - improved diversion away from fistula with neph tubes and foley. All to remain for now as this making her hygeine and care managable.   Long term, she will likely need permanent GI and GU diversion when nutritionally replete, this will be months away.   Agree with likely SNF placement given level of deconditioning at anytime.   Will follow. She has GU FU arranged in 2 mos.   Please call me directly with questions.   [facility], Fernando Stoiber 09/13/2017

## 2017-09-13 NOTE — Progress Notes (Signed)
Patient switched back to [facility] bed per her request that it's easier to sleep on her side.

## 2017-09-13 NOTE — Procedures (Signed)
Interventional Radiology Procedure Note  Procedure: Right nephrostogram   Complications: None  Estimated Blood Loss: None  Findings: Right nephrostomy tube intact without leak.  Distal catheter well positioned in right renal pelvis.  Venetia Night. Kathlene Cote, M.D Pager:  [phone]

## 2017-09-13 NOTE — Consult Note (Signed)
[place] Nurse ostomy follow up Stoma type/location: RLQ ileostomy. Pouch (applid by her husband this morning) is leaking. Stomal assessment/size: 1 and 1/8 inches Peristomal assessment: Chronic peristomal contact irritant dermatitis Treatment options for stomal/peristomal skin:  Complex pouching procedure. Pouch removed. Skin cleansed with water and adhesive remover used to remove residual.  Washed peristomal skin with soap and water, rinsed well. [place] adhesive applied and fanned dry.   Pouch prepared, using Eakin ring and 2 one-third pieces at 3 and 9 o'clock. Medical adhesive spray used on skin barrier and on skin and allowed to dry for 5 minutes. Eakin paste applied around stoma and patted with wet finger to smooth. Pouching system applied and held in place for 3-4 minutes Output: Pasty, brown stool Ostomy pouching: 1pc.convex Education provided: None today.  Patient was recently medicated, but requires additional pain medication for denuded peristomal skin.  She cannot take PO in the reclining position and stool is constantly oozing from ostomy-thwarting pouching procedure. IV pain medication requested, MD on call provides order. Patient able to relax, bathe after pouch procedure and is assisted into a clean gown and on fresh bed linens. She is to go to CT for a scan. Supplies restocked in room. Enrolled patient in [place] Start Discharge program: Yes [place] nursing team will follow, and will remain available to this patient, the nursing and medical teams.   Thanks, Maudie Flakes, MSN, RN, [place], Arther Abbott  Pager# [phone]

## 2017-09-14 LAB — CREATININE, SERUM
Creatinine, Ser: 2.3 mg/dL — ABNORMAL HIGH (ref 0.44–1.00)
GFR calc non Af Amer: 21 mL/min — ABNORMAL LOW (ref >60)
GFR, EST AFRICAN AMERICAN: 25 mL/min — AB (ref >60)

## 2017-09-14 LAB — GLUCOSE, CAPILLARY
GLUCOSE-CAPILLARY: 103 mg/dL — AB (ref 65–99)
GLUCOSE-CAPILLARY: 95 mg/dL (ref 65–99)
Glucose-Capillary: 224 mg/dL — ABNORMAL HIGH (ref 65–99)
Glucose-Capillary: 161 mg/dL — ABNORMAL HIGH (ref 65–99)

## 2017-09-14 LAB — MAGNESIUM: Magnesium: 1.4 mg/dL — ABNORMAL LOW (ref 1.7–2.4)

## 2017-09-14 LAB — POTASSIUM: Potassium: 3.6 mmol/L (ref 3.5–5.1)

## 2017-09-14 MED ORDER — POTASSIUM CHLORIDE CRYS ER 20 MEQ PO TBCR
40.0000 meq | EXTENDED_RELEASE_TABLET | Freq: Once | ORAL | Status: AC
  Administered 2017-09-14: 40 meq via ORAL
  Filled 2017-09-14: qty 2

## 2017-09-14 MED ORDER — ALUM & MAG HYDROXIDE-SIMETH 200-200-20 MG/5ML PO SUSP
30.0000 mL | Freq: Four times a day (QID) | ORAL | Status: DC | PRN
  Administered 2017-09-14 – 2017-09-17 (×2): 30 mL via ORAL
  Filled 2017-09-14 (×3): qty 30

## 2017-09-14 MED ORDER — MAGNESIUM SULFATE 4 GM/100ML IV SOLN
4.0000 g | Freq: Once | INTRAVENOUS | Status: AC
  Administered 2017-09-14: 4 g via INTRAVENOUS
  Filled 2017-09-14: qty 100

## 2017-09-14 NOTE — Progress Notes (Signed)
Referring Physician(s): Manny,T  Supervising Physician: Marybelle Killings  Patient Status:  [facility] - In-pt  Chief Complaint: Bladder leak   Subjective: Pt still has some pain/burning sensation extending from rt flank to hip region   Allergies: Penicillins; Ultram [tramadol]; Adhesive [tape]; Cefaclor; Erythromycin; Trimethoprim; Ciprofloxacin; Fluconazole; Oxycodone; Pectin; and Sulfa antibiotics  Medications: Prior to Admission medications   Medication Sig Start Date End Date Taking? Authorizing Provider  anastrozole (ARIMIDEX) 1 MG tablet TAKE 1 TABLET DAILY 12/08/16  Yes Gorsuch, Ni, MD  Biotin 5 MG TABS Take 5 mg by mouth every morning.    Yes [provider]  Cholecalciferol (VITAMIN D3) 10000 UNITS capsule Take 10,000 Units by mouth once a week. Sunday evening's   Yes [provider]  diphenoxylate-atropine (LOMOTIL) 2.5-0.025 MG tablet Take 1-2 tablets by mouth every 8 (eight) hours. TO PREVENT LOOSE BOWEL MOVEMENTS 05/20/17  Yes [provider]  Famotidine-Ca Carb-Mag Hydrox (PEPCID COMPLETE PO) Take 1 tablet by mouth daily as needed (INDIGESTION).   Yes [provider]  filgrastim (NEUPOGEN) 480 MCG/1.6ML injection Inject 1.6 ml under the skin every 5 days for life Patient taking differently: Inject 480 mcg into the skin See admin instructions. Inject 1.6 ml under the skin every 6 days for life 01/06/17  Yes Gorsuch, Ni, MD  levothyroxine (SYNTHROID, LEVOTHROID) 150 MCG tablet Take 150 mcg by mouth daily before breakfast.    Yes [provider]  loratadine (CLARITIN) 10 MG tablet Take 10 mg by mouth every morning.    Yes [provider]  omega-3 acid ethyl esters (LOVAZA) 1 G capsule Take 1 g by mouth 2 (two) times daily.    Yes [provider]  ondansetron (ZOFRAN-ODT) 4 MG disintegrating tablet Take 4 mg by mouth every 6 (six) hours as needed for nausea or vomiting.  04/12/17  Yes [provider]  Polyethyl  Glycol-Propyl Glycol (SYSTANE OP) Place 1 drop into both eyes at bedtime.    Yes [provider]  Prenatal Vit-Fe Fumarate-FA (PRENATAL VITAMIN PO) Take 1 capsule by mouth daily. Takes prenatal because there are no dyes in it   Yes [provider]  prochlorperazine (COMPAZINE) 10 MG tablet Take 1 tablet (10 mg total) by mouth 2 (two) times daily as needed for nausea or vomiting. Patient taking differently: Take 10 mg by mouth 2 (two) times daily as needed for nausea or vomiting.  05/28/17  Yes Virgel Manifold, MD  promethazine (PHENERGAN) 25 MG tablet Take 1 tablet (25 mg total) by mouth every 6 (six) hours as needed for nausea or vomiting. 05/28/17  Yes Virgel Manifold, MD  ranitidine (ZANTAC) 150 MG tablet Take 150 mg by mouth 2 (two) times daily as needed for heartburn.    Yes [provider]  rosuvastatin (CRESTOR) 10 MG tablet Take 10 mg by mouth every evening.    Yes [provider]  sitaGLIPtin (JANUVIA) 100 MG tablet Take 100 mg by mouth every morning.    Yes [provider]  HYDROcodone-acetaminophen (NORCO/VICODIN) 5-325 MG tablet Take 1 tablet by mouth every 6 (six) hours as needed (Pain). Patient not taking: Reported on 08/29/2017 08/10/17   Shelda Pal, DO     Vital Signs: BP (!) 134/43 (BP Location: Right Leg)   Pulse 75   Temp 99 F (37.2 C) (Oral)   Resp 17   Ht 5' 2"  (1.575 m)   Wt 208 lb 8.9 oz (94.6 kg)   SpO2 99%   BMI 38.15  kg/m   Physical Exam alert; bilat PCN's intact, insertion sites with small amt drainage, outputs L- 400 cc, R- 195 cc yellow urine; rt flank remains tender  Imaging: Dg Lumbar Spine Complete  Result Date: 09/10/2017 CLINICAL DATA:  Pain aggravated by sitting, fell in bathroom 3 days ago, generalized RIGHT hip pain, midline lower back pain, and bottom pain EXAM: LUMBAR SPINE - COMPLETE 4+ VIEW COMPARISON:  CT abdomen and pelvis 08/31/2017 FINDINGS: Osseous demineralization. Five non-rib-bearing  lumbar vertebra. Multilevel disc space narrowing. Vertebral body heights maintained. No acute fracture, subluxation, or bone destruction. SI joints preserved. BILATERAL nephrostomy tubes. Numerous pelvic surgical clips as well as RIGHT upper quadrant surgical clips. IMPRESSION: Osseous demineralization with mild degenerative disc disease changes of the lumbar spine. No acute abnormalities. Electronically Signed   By: [name] M.D.   On: 09/10/2017 20:38   Dg Sacrum/coccyx  Result Date: 09/10/2017 CLINICAL DATA:  Pain aggravated by sitting, fell in bathroom 3 days ago, generalized RIGHT hip pain, midline lower back pain, and bottom pain EXAM: SACRUM AND COCCYX - 2+ VIEW COMPARISON:  CT abdomen and pelvis 08/31/2017 FINDINGS: Minimal osteitis pubis. SI joints and sacral foramina symmetric. No sacrococcygeal fractures identified. Degenerative disc disease changes at L5-S1 with disc space narrowing, minimal endplate spur formation and endplate sclerosis. IMPRESSION: No acute sacrococcygeal abnormalities. Degenerative disc disease changes at L5-S1. Electronically Signed   By: [name] M.D.   On: 09/10/2017 20:39   Dg Hip Unilat With Pelvis 1v Right  Result Date: 09/10/2017 CLINICAL DATA:  Pain aggravated by sitting, fell in bathroom 3 days ago, generalized RIGHT hip pain, midline lower back pain, and bottom pain EXAM: DG HIP (WITH OR WITHOUT PELVIS) 1V RIGHT COMPARISON:  None FINDINGS: Hip and SI joints symmetric and preserved. Bones appear slightly demineralized. Minimal osteitis pubis. No acute fracture, dislocation, or bone destruction. IMPRESSION: No acute osseous abnormalities. Electronically Signed   By: [name] M.D.   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 After recent fall, the patient is  complaining of significant pain in the right flank region near the site of the indwelling right nephrostomy tube. EXAM: RIGHT NEPHROSTOGRAM COMPARISON:  Imaging at the time of nephrostomy tube placement on 09/02/2017 MEDICATIONS: None ANESTHESIA/SEDATION: None CONTRAST:  5 mL Isovue-300-administered into the collecting system(s) FLUOROSCOPY TIME:  Fluoroscopy Time: 12 seconds.  3.8 mGy. COMPLICATIONS: None immediate. PROCEDURE: Under fluoroscopy, contrast material was injected via the pre-existing right percutaneous nephrostomy tube. Fluoroscopic images were saved as a cine loop. The catheter was flushed and reattached to a gravity drainage bag. FINDINGS: The percutaneous nephrostomy tube is intact without evidence of catheter rupture or contrast extravasation. The catheter is formed at the level of the renal pelvis and is well positioned. No significant filling defects are identified in the renal collecting system or opacified ureter. IMPRESSION: Unremarkable right nephrostogram via pre-existing percutaneous nephrostomy tube demonstrating intact nephrostomy tube which is positioned in the right renal pelvis. Electronically Signed   By: Aletta Edouard M.D.   On: 09/13/2017 16:10    Labs:  CBC: Recent Labs    09/05/17 0433 09/07/17 0324 09/08/17 0355 09/13/17 0335  WBC 5.6 4.2 2.4* 4.3  HGB 8.2* 8.2* 7.7* 8.0*  HCT 25.0* 26.1* 25.2* 25.5*  PLT 272 321 333 436*    COAGS: Recent Labs  04/16/17 0047 09/02/17 0939  INR 1.21 1.21    BMP: Recent Labs    09/06/17 0348 09/07/17 0324 09/08/17 0355 09/09/17 0457 09/12/17 0329 09/13/17 0335 09/14/17 0410  NA 131* 131* 134* 131*  --   --   --   K 3.7 3.7 3.8 3.9 4.0 3.7 3.6  CL 101 102 107 105  --   --   --   CO2 22 21* 20* 18*  --   --   --   GLUCOSE 156* 114* 150* 228*  --   --   --   BUN 48* 47* 51* 47*  --   --   --   CALCIUM 8.4* 8.5* 8.5* 8.4*  --   --   --   CREATININE 3.23* 3.23* 2.88* 2.65* 2.47* 2.32* 2.30*  GFRNONAA 14*  14* 16* 18* 20* 21* 21*  GFRAA 16* 16* 19* 21* 23* 25* 25*    LIVER FUNCTION TESTS: Recent Labs    04/20/17 0510 04/21/17 0416 05/27/17 1724 08/29/17 1550  09/06/17 0348 09/07/17 0324 09/08/17 0355 09/09/17 0457  BILITOT 0.4 0.5 0.3 0.5  --   --   --   --   --   AST 13* 13* 24 16  --   --   --   --   --   ALT 10* 10* 33 24  --   --   --   --   --   ALKPHOS 177* 190* 166* 173*  --   --   --   --   --   PROT 5.8* 5.9* 9.2* 6.8  --   --   --   --   --   ALBUMIN 1.8* 1.8* 3.9 2.1*   < > 1.6* 1.9* 1.8* 1.8*   < > = values in this interval not displayed.    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 Rowe Robert, PA-C 09/14/2017, 3:08 PM   I spent a total of 15 minutes at the the patient's bedside AND on the patient's hospital floor or unit, greater than 50% of which was counseling/coordinating care for bilateral nephrostomies    Patient ID: [name], female   DOB: [birth year], 62 y.o.   MRN: [id number]

## 2017-09-14 NOTE — Progress Notes (Signed)
Nephrostomy Dressing change will be done by am nurse.  Patient wanted to wait until she's out off bed before having dressing changed.  Also patient was assisted in turning form Right side and left side every 2 to 3 hours during the night.

## 2017-09-14 NOTE — Progress Notes (Signed)
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 Pt had her stent removed 08/17/17 and admitted 08/29/17 for acute renal failure.   OT comments  Pt feeling better this day  Follow Up Recommendations  SNF    Equipment Recommendations  Other (comment)    Recommendations for Other Services      Precautions / Restrictions Precautions Precautions: Fall Precaution Comments: Multiple lines (catheter, bil PCN tubes)       Mobility Bed Mobility               General bed mobility comments: pt in chair  Transfers            NT              ADL either performed or assessed with clinical judgement   ADL Overall ADL's : Needs assistance/impaired(CNA reports pt did A with her bathing this day)                                             Vision Baseline Vision/History: No visual deficits     Perception     Praxis      Cognition Arousal/Alertness: Awake/alert Behavior During Therapy: WFL for tasks assessed/performed Overall Cognitive Status: Within Functional Limits for tasks assessed                                          Exercises Shoulder Exercises Shoulder Flexion: AROM;Strengthening;Both;Theraband;Supine;20 reps Shoulder ABduction: AROM;Both;Strengthening;Theraband;Supine;20 reps Elbow Flexion: AROM;Theraband;Supine;20 reps Elbow Extension: AROM;Strengthening;Both;Theraband;Supine;20 reps   Shoulder Instructions       General Comments      Pertinent Vitals/ Pain       Pain Score: 3  Pain Location: back Pain Descriptors / Indicators:  Sore Pain Intervention(s): Monitored during session  Home Living                                          Prior Functioning/Environment              Frequency  Min 2X/week        Progress Toward Goals  OT Goals(current goals can now be found in the care plan section)  Progress towards OT goals: Progressing toward goals     Plan Discharge plan remains appropriate    Co-evaluation                 AM-PAC PT "6 Clicks" Daily Activity     Outcome Measure   Help from another person eating meals?: None Help from another person taking care of personal grooming?: None Help from another person toileting, which includes using toliet, bedpan, or urinal?: A Lot Help from another person bathing (including washing, rinsing, drying)?: A Lot Help from another person to put on and taking off regular upper body clothing?: A Little Help from another person to put on  and taking off regular lower body clothing?: A Lot 6 Click Score: 17    End of Session    OT Visit Diagnosis: Unsteadiness on feet (R26.81);Muscle weakness (generalized) (M62.81)   Activity Tolerance Patient tolerated treatment well   Patient Left with call bell/phone within reach;in chair;with family/visitor present   Nurse Communication Mobility status        Time: 0910-6816 OT Time Calculation (min): 27 min  Charges: OT General Charges $OT Visit: 1 Visit OT Treatments $Therapeutic Exercise: 23-37 mins  [place], [place]   [name] 09/14/2017, 1:35 PM

## 2017-09-14 NOTE — Progress Notes (Signed)
Progress note  Reason for consult: Pain management  Chart reviewed including MAR.  Discussed with bedside RN.  I saw and examined [name] this evening.  She was sleeping at the time of my visit and woke briefly but did not really participate in conversation.  She has been telling family that she desperately needs to rest.  Her sister-in-law [name] was at the bedside.  She reports that pain has been much better controlled throughout the day today.  MAR reveals that she has had 1 dose of p.o. medication and 1 dose of IV medication since this morning.  She has had a fentanyl patch in place since around 7 PM last evening.  As her as needed usage is decreasing and it is not yet been a full 24 hours since placement of the patch, I would recommend continuing with the same regimen for this evening.  I will check in again with her tomorrow.  If it appears that her rescue dose of Dilaudid is not sufficient to relieve her breakthrough pain, would consider slight increase in this.  I think we are getting close to her regimen with which she can be discharged.  I did talk with her sister-in-law and nurse about encouraging her to premedicate prior to activities that are going to cause her pain.  Additionally, she is only taking Lyrica once daily.  While I agree with decreased dose due to renal impairment, it may be worth considering dividing this up to twice a day dosing.  We also may want to consider increasing this slightly to 75 mg twice daily.  Will plan for another visit tomorrow earlier in the day to reassess pain management and make further adjustments based upon her usage of as needed dosing overnight.   Total time: 25 minutes Greater than 50%  of this time was spent counseling and coordinating care related to the above assessment and plan.  [name] Rough, MD [place] [facility] [phone]

## 2017-09-14 NOTE — Progress Notes (Signed)
[facility]  [address] [place], [place] [address] Phone: [phone]  FAX: [phone]      Corinthian Kemler [id number] Jul 27, 1956  CARE TEAM:  PCP: Ann Held, DO  Outpatient Care Team: Patient Care Team: Carollee Herter, Alferd Apa, DO as PCP - General (Family Medicine) Heath Lark, MD as Consulting Physician (Hematology and Oncology) Everitt Amber, MD as Consulting Physician (Obstetrics and Gynecology) Michael [name], MD as Consulting Physician (General Surgery) Alexis Frock, MD as Consulting Physician (Urology) Altheimer, Legrand Como, MD as Consulting Physician (Endocrinology) Katy Apo, MD as Consulting Physician (Ophthalmology) Irene Limbo, MD as Consulting Physician (Plastic Surgery) Madelon Lips, MD as Consulting Physician (Nephrology)  Inpatient Treatment Team: Treatment Team: Attending Provider: Michael [name], MD; Consulting Physician: Alexis Frock, MD; Registered Nurse: Illene Regulus, RN; Technician: Reuel Derby, NT; Consulting Physician: Fatima Blank, MD; Consulting Physician: Michael [name], MD; Technician: Leda Quail, NT; Consulting Physician: Patrecia Pour, MD; Consulting Physician: Heath Lark, MD; Technician: Estrellita Ludwig, [place]; Rounding Team: Dorthy Cooler Radiology, MD; Technician: Sueanne Margarita, [place]; Consulting Physician: Faythe Dingwall, DO; Consulting Physician: Damita Lack, MD; Technician: Etheleen Sia, [place]; Technician: Rosalia Hammers, [place]; Physical Therapist: Max Sane, PT; Occupational Therapist: [name], OT   Problem List:   Principal Problem:   MDD (major depressive disorder), single episode, moderate ([place]) Active Problems:   Chronic kidney disease (CKD), stage III (moderate) ([place])   Colovesical fistula - delayed   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 s/p resection/partial vaginectomy/ LAR/colostomy  11/19/2014   Ureteral stricture, right, 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) 04/01/2017   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   Primary hypothyroidism   DM type 2 (diabetes mellitus, type 2) ([place])   History of ovarian & endometrial cancer   Morbid obesity ([place])   Pelvic cancer s/p colostomy takedown/loop ileostomy diversion 04/01/2017   Protein-calorie malnutrition, moderate ([place])   Dehydration   ARF (acute renal failure) ([place])   Adjustment disorder with mixed anxiety and depressed mood   Poorly controlled diabetes mellitus ([place])   Pressure injury of skin   History of external beam radiation therapy to pelvis  SURGERY 04/01/2017     POST-OPERATIVE DIAGNOSIS:   Recurrent endometrial cancer status post low anterior resection and partial pelvic exeneration  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   SURGEON:Loveah Like Gwynneth Aliment, MD- Primary (Dr Tresa Moore assist) PROCEDURE:  XI ROBOTIC LYSIS OF ADHESIONS x 5 hours XI ROBOTIC COLOSTOMY TAKEDOWN ROBOTIC SEWN COLOANAL ANASTOMOSIS Ileocecal resection with anastomosis Diverting loop ileostomy  SURGEON: Alexis Frock, MD - Primary (Dr Johney Maine assist) PROCEDURE:  Cystoscopy with R retrograde and stent exchange.  Robotic R ureteral reimplant with psoas bladder hitch.  Consults: urology   SURGERY 08/17/2017  DIAGNOSIS:  Right ureteral stricture, status post complex reconstruction.  PROCEDURES: 1. Cystoscopy with right retrograde pyelogram. 2. Removal of right ureteral stent.  PHYSICIAN:  Alexis Frock, MD        SPECIMENS:  Right ureteral stone for discard, inspected and intact.  FINDINGS: 1. Fusiform bladder shape consistent with prior psoas hitch. 2. Visibly patent ureteral-bladder anastomosis. 3. No evidence of hydronephrosis or narrowing with right retrograde     pyelogram.    Assessment  Struggling with pain control  Delayed bladder leak turned into colovesical  fistula with preferential urinary drainage in decompressed diverted colon limb - diverted urine w bilateral nephrostomy tubes & foley.  .  Plan:   Trying  to improve pain control.  Palliative care recommended fentanyl patch for now given her renal insufficiency .  Still needing IV pain meds.  Lidocaine patch may be helping pain on right back nephrostomy tube site.  Oral Dilaudid dose helps at 4 mg only.  Inc Scheduled Tylenol to QID.  Lyrica for chronic pain / depression - increased from 75 to 187m. Robaxin PRN.     Pain at R back nephrostomy tube site - Xrays w/o fracture.  Nephrostomy tube injection showing no leak or abnormality.  Urology and interventional radiology feel like it is in good place.  Less painful today.  Change dressing more often.    Gluteal skin breakdown - agree w barrier cream.  Since the first day that she did not immediately complain and cry about it.  She concedes is feeling better finally.  Urinary diversion as much as possible - still not 100% with anal leakage.  Powders PRN.  GET HER UP to avoid deecubitus.  She hates the air bed, so maybe go back to foam egg crate if she is getting up more consistently. Try to avoid deeper decubitus formation  Discussed with urology, Dr. MTresa Moore  S/p percutaneous nephrostomy tubes to divert urine directly out of kidneys and out of the bladder that is draining into her neorectum/colon.  Still needing Foley catheter.  Still leaking urine output anus.  Somewhat improved but fair.  Suspect Ileal conduit imminent   Follow off antibiotics  Mag low - replacing again.  K & Phos OK.  Follow  Zoloft for depression.  Psychiatric evaluation done - added trazadone QHS & rec PCP f/u.  Psych f/u PRN  Diabetes better controlled.  D/w TRH.  Continue lantus Bid & PO.  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  Iron deficiency anemia with borderline hemoglobin.  Given another dose of IV dextran this admission.  Oral iron - QHS.  She had  difficulty absorbing and tolerating.  With urine leak better controlled, perhaps she can tolerate better now  Solid diet.  Bowel regimen.  Output under control w/o massive antidiarrheals.    Ostomy care - not major issues w ileostomy  Continue neupogen injections for pancytopenia  VTE prophylaxis: SCDs, etc  Ambulate as tolerated to help recovery    Leak on the left lateral inferiolateral corner of the cystostomy Heineke-Mikulicz and psoas hitch needed for the bladder to reach the right ureter at that had the chronic severe distal stricture.  I am highly skeptical that a radiated bladder with a delayed leak will spontaneously heal.  She had internal Foley drainage, external surgical drainage, internal stenting drainage for a few months, yet this happened. Therefore, most likely will require surgical repair.  Standard of care usually on this situation is to resect colon & do a new distal anastomosis. Primarily repair the bladder & patch it.  However, there is no way I can redo anastomosis in a radiated pelvis with prior surgeries with prior anterior exoneration, radiation, 7-hour lysis adhesions and prior surgery when she was in better shape.  Only options I can see is to try & primarily patch the colon with OMENTOPEXY, primarily repair the bladder with OMENTOPEXY, and hope it will heal.  I am skeptical that it will.  Most likely we will have to convert to an end colostomy to get the colon away from it and allow the bladder to be patched.  I do agree with Dr. MTresa Moorethat she is rather malnourished and deconditioned at  this point so would like to try and hold off on definitive repair if we can.   I suspect that her bladder is probably not salvageable and she may need a completion cystectomy with ileal conduit.  I will defer to Dr. Zettie Pho expertise with urology.  We will discuss that later once she is in better shape to actually tolerate a reoperation should it come to that (very likely).  Home health  nursing seems to have failed.  Lack of outpatient ostomy clinic in town here making outpatient management challenging.   I strongly suspect she will need a SNF.  Patient more motivated to get up and move around.  She is still embarrassed in frustrated with persistent urine leak out anus.  But she is getting up some.  Work to try and transition to outpatient rehab with SNF next week.  Needs better pain control     35 minutes spent in review, evaluation, examination, counseling, and coordination of care.  More than 50% of that time was spent in counseling.  [name], M.D., F.A.C.S. Gastrointestinal and Minimally Invasive Surgery [facility] [address] [place], [place] [address] [phone] Main / Paging    09/14/2017    Subjective: (Chief complaint)  Comment.  Not crying for the first time.  Had rough night but feeling much better this morning.  Still with gluteal pain but not as severe  Lidocaine may be helping.  PO Dilaudid not enough - needed IV Walked in hallway much better Up in chair Tolerating food OK.  Objective:  Vital signs:  Vitals:   09/13/17 1724 09/13/17 2126 09/14/17 0428 09/14/17 0525  BP: (!) 129/47 (!) 124/41  (!) 146/63  Pulse: 66 75  81  Resp: _0 Temp: 98.6 F (37 C) 98.3 F (36.8 C)  99.2 F (37.3 C)  TempSrc: Oral Oral  Oral  SpO2: 100%   99%  Weight:   94.6 kg (208 lb 8.9 oz)   Height:        Last BM Date: 09/13/17  Intake/Output   Yesterday:  02/12 0701 - 02/13 0700 In: 8295 [P.O.:1140; I.V.:20] Out: 3850 [Urine:2550; Stool:1300] This shift:  No intake/output data recorded.  Bowel function:  Flatus: YES   BM:  YES.  Thick oatmeal consistency succus in ileostomy bag.  Drain: No drains.     Physical Exam:  General: Pt awake/alert/oriented x4 in mild acute distress. Eyes: PERRL, normal EOM.  Sclera clear.  No icterus Neuro: CN II-XII intact w/o focal sensory/motor  deficits. Lymph: No head/neck/groin lymphadenopathy Psych:  No delerium/psychosis/paranoia.  Crying & tired.  Somewhat consolable HENT: Normocephalic, Mucus membranes moist.  No thrush Neck: Supple, No tracheal deviation Chest: No chest wall pain w good excursion CV:  Pulses intact.  Regular rhythm MS: Normal AROM mjr joints.  No obvious deformity.  Bilateral perc nephrostomy bags - R posterior sensitive but no cellultitis  Abdomen: Soft.  Nondistended.  No tenderness to palpation.  No evidence of peritonitis.  No incarcerated hernias.  No wounds.  Ileostomy pink with gas and succus in bag.      GU.  No purulent drainage or hematoma.  Foley in place with clear light yellow urine  Rectal deferred.  Last week had stricturing at coloanal anastomosis.  Had been finger dilated.  Held off on that today.  Gluteal skin breakdown but controlled w Buttpaste barrier cream. Ext:   No deformity.  No mjr edema.  No  cyanosis Skin: No petechiae / purpura  Results:   Labs: Results for orders placed or performed during the hospital encounter of 08/29/17 (from the past 48 hour(s))  Prealbumin     Status: Abnormal   Collection Time: 09/12/17  9:03 AM  Result Value Ref Range   Prealbumin 9.4 (L) 18 - 38 mg/dL    Comment: Performed at [facility], [place] [address], [place], [place] [address]  Glucose, capillary     Status: Abnormal   Collection Time: 09/12/17 11:52 AM  Result Value Ref Range   Glucose-Capillary 124 (H) 65 - 99 mg/dL  Glucose, capillary     Status: Abnormal   Collection Time: 09/12/17  5:45 PM  Result Value Ref Range   Glucose-Capillary 107 (H) 65 - 99 mg/dL  Glucose, capillary     Status: Abnormal   Collection Time: 09/12/17  9:18 PM  Result Value Ref Range   Glucose-Capillary 153 (H) 65 - 99 mg/dL  Potassium     Status: None   Collection Time: 09/13/17  3:35 AM  Result Value Ref Range   Potassium 3.7 3.5 - 5.1 mmol/L    Comment: Performed at [facility],  [place] [address], [place], [place] [address]  Creatinine, serum     Status: Abnormal   Collection Time: 09/13/17  3:35 AM  Result Value Ref Range   Creatinine, Ser 2.32 (H) 0.44 - 1.00 mg/dL   GFR calc non Af Amer 21 (L) >60 mL/min   GFR calc Af Amer 25 (L) >60 mL/min    Comment: (NOTE) The eGFR has been calculated using the CKD EPI equation. This calculation has not been validated in all clinical situations. eGFR's persistently <60 mL/min signify possible Chronic Kidney Disease. Performed at [facility], [place] [address], [place], [place] [address]   Magnesium     Status: None   Collection Time: 09/13/17  3:35 AM  Result Value Ref Range   Magnesium 2.0 1.7 - 2.4 mg/dL    Comment: Performed at [facility], [place] [address], [place], [place] [address]  CBC with Differential/Platelet     Status: Abnormal   Collection Time: 09/13/17  3:35 AM  Result Value Ref Range   WBC 4.3 4.0 - 10.5 K/uL   RBC 2.68 (L) 3.87 - 5.11 MIL/uL   Hemoglobin 8.0 (L) 12.0 - 15.0 g/dL   HCT 25.5 (L) 36.0 - 46.0 %   MCV 95.1 78.0 - 100.0 fL   MCH 29.9 26.0 - 34.0 pg   MCHC 31.4 30.0 - 36.0 g/dL   RDW 16.2 (H) 11.5 - 15.5 %   Platelets 436 (H) 150 - 400 K/uL   Neutrophils Relative % 55 %   Lymphocytes Relative 18 %   Monocytes Relative 22 %   Eosinophils Relative 4 %   Basophils Relative 1 %   Neutro Abs 2.4 1.7 - 7.7 K/uL   Lymphs Abs 0.8 0.7 - 4.0 K/uL   Monocytes Absolute 0.9 0.1 - 1.0 K/uL   Eosinophils Absolute 0.2 0.0 - 0.7 K/uL   Basophils Absolute 0.0 0.0 - 0.1 K/uL   WBC Morphology MILD LEFT SHIFT (1-5% METAS, OCC MYELO, OCC BANDS)     Comment: Performed at [facility], [place] [address], [place], [place] [address]  Glucose, capillary     Status: None   Collection Time: 09/13/17  7:41 AM  Result Value Ref Range   Glucose-Capillary 96 65 - 99 mg/dL  Glucose,  capillary     Status: Abnormal   Collection Time: 09/13/17 12:06 PM  Result  Value Ref Range   Glucose-Capillary 253 (H) 65 - 99 mg/dL  Glucose, capillary     Status: Abnormal   Collection Time: 09/13/17  5:20 PM  Result Value Ref Range   Glucose-Capillary 141 (H) 65 - 99 mg/dL  Glucose, capillary     Status: Abnormal   Collection Time: 09/13/17  9:32 PM  Result Value Ref Range   Glucose-Capillary 164 (H) 65 - 99 mg/dL  Potassium     Status: None   Collection Time: 09/14/17  4:10 AM  Result Value Ref Range   Potassium 3.6 3.5 - 5.1 mmol/L    Comment: Performed at [facility], [place] [address], [place], [place] [address]  Creatinine, serum     Status: Abnormal   Collection Time: 09/14/17  4:10 AM  Result Value Ref Range   Creatinine, Ser 2.30 (H) 0.44 - 1.00 mg/dL   GFR calc non Af Amer 21 (L) >60 mL/min   GFR calc Af Amer 25 (L) >60 mL/min    Comment: (NOTE) The eGFR has been calculated using the CKD EPI equation. This calculation has not been validated in all clinical situations. eGFR's persistently <60 mL/min signify possible Chronic Kidney Disease. Performed at [facility], [place] [address], [place], [place] [address]   Magnesium     Status: Abnormal   Collection Time: 09/14/17  4:10 AM  Result Value Ref Range   Magnesium 1.4 (L) 1.7 - 2.4 mg/dL    Comment: Performed at [facility], [place] [address], [place], [place] [address]  Glucose, capillary     Status: Abnormal   Collection Time: 09/14/17  7:12 AM  Result Value Ref Range   Glucose-Capillary 103 (H) 65 - 99 mg/dL    Imaging / Studies: Ir Nephrostogram Right Thru Existing Access  Result Date: 09/13/2017 INDICATION: History of bilateral percutaneous nephrostomy tube placement on 09/02/2017 to divert urine due to fistula between the rectum and bladder. After recent fall, the patient is complaining of significant pain in the right flank region near the site of the indwelling right nephrostomy tube. EXAM: RIGHT NEPHROSTOGRAM COMPARISON:   Imaging at the time of nephrostomy tube placement on 09/02/2017 MEDICATIONS: None ANESTHESIA/SEDATION: None CONTRAST:  5 mL Isovue-300-administered into the collecting system(s) FLUOROSCOPY TIME:  Fluoroscopy Time: 12 seconds.  3.8 mGy. COMPLICATIONS: None immediate. PROCEDURE: Under fluoroscopy, contrast material was injected via the pre-existing right percutaneous nephrostomy tube. Fluoroscopic images were saved as a cine loop. The catheter was flushed and reattached to a gravity drainage bag. FINDINGS: The percutaneous nephrostomy tube is intact without evidence of catheter rupture or contrast extravasation. The catheter is formed at the level of the renal pelvis and is well positioned. No significant filling defects are identified in the renal collecting system or opacified ureter. IMPRESSION: Unremarkable right nephrostogram via pre-existing percutaneous nephrostomy tube demonstrating intact nephrostomy tube which is positioned in the right renal pelvis. Electronically Signed   By: Aletta Edouard M.D.   On: 09/13/2017 16:10    Medications / Allergies: per chart  Antibiotics: Anti-infectives (From admission, onward)   Start     Dose/Rate Route Frequency Ordered Stop   08/30/17 1000  ciprofloxacin (CIPRO) IVPB 200 mg  Status:  Discontinued     200 mg 100 mL/hr over 60 Minutes Intravenous Every 24 hours 08/30/17 0855 09/06/17 0805        Note: Portions of  this report may have been transcribed using voice recognition software. Every effort was made to ensure accuracy; however, inadvertent computerized transcription errors may be present.   Any transcriptional errors that result from this process are unintentional.     [name], M.D., F.A.C.S. Gastrointestinal and Minimally Invasive Surgery [facility] [address] [place], [name] [address] [phone] Main / Paging    09/14/2017

## 2017-09-14 NOTE — Progress Notes (Signed)
   09/14/17 1400  Clinical Encounter Type  Visited With Patient  Visit Type Follow-up  Spiritual Encounters  Spiritual Needs Prayer   Patient was sitting up in bed with a friend present.  Patient stated today was a better day and that she hopes to be released to [facility] for Rehab.  Expressed a readiness to get better and home.  Family support and friend support.  We prayed together.  Will follow as needed. Chaplain Katherene Ponto

## 2017-09-14 NOTE — Progress Notes (Signed)
Physical Therapy Treatment Patient Details Name: [name] MRN: [id number] DOB: 1956-04-01 Today's Date: 09/14/2017    History of Present Illness Pt is a 62 year old female with hx of bilateral mastectomies for breast cancer, chronic neutropenia,  h/o ovarian and endometrial CA, h/o colostomy 2016, w/takedown/loop ileostomy diversion as well as resection of ureteral stricture w/ bladder hitch reimplantation all in 03/2017. Pt had her stent removed 08/17/17 and admitted 08/29/17 for acute renal failure.    PT Comments    Patient with slow progress due to days she feels she cannot participate.  Today is a "good" day, but continued to have R sided pain at drain site and hip pain that were limiting at times.  Do feel she can benefit from SNF level rehab at d/c.  PT to follow.   Follow Up Recommendations  SNF     Equipment Recommendations  None recommended by PT    Recommendations for Other Services       Precautions / Restrictions Precautions Precautions: Fall Precaution Comments: Multiple lines (catheter, bil PCN tubes)    Mobility  Bed Mobility               General bed mobility comments: pt in chair  Transfers Overall transfer level: Needs assistance Equipment used: Rolling walker (2 wheeled) Transfers: Sit to/from Stand Sit to Stand: Min guard         General transfer comment: up from recliner with minguard due to reported sitting in chair a long time  Ambulation/Gait Ambulation/Gait assistance: Min guard;Supervision Ambulation Distance (Feet): 300 Feet Assistive device: Rolling walker (2 wheeled) Gait Pattern/deviations: Step-through pattern;Decreased stride length;Trunk flexed;Wide base of support     General Gait Details: corrected some with cues, but unable to maintain, more pain this session   Stairs            Wheelchair Mobility    Modified Rankin (Stroke Patients Only)       Balance Overall balance assessment: Needs  assistance Sitting-balance support: No upper extremity supported Sitting balance-Leahy Scale: Good     Standing balance support: No upper extremity supported Standing balance-Leahy Scale: Good Standing balance comment: able to stand and readjust covers on bed prior to sitting, walker for ambualation                            Cognition Arousal/Alertness: Awake/alert Behavior During Therapy: WFL for tasks assessed/performed Overall Cognitive Status: Within Functional Limits for tasks assessed                                        Exercises Shoulder Exercises Shoulder Flexion: AROM;Strengthening;Both;Theraband;Supine;20 reps Shoulder ABduction: AROM;Both;Strengthening;Theraband;Supine;20 reps Elbow Flexion: AROM;Theraband;Supine;20 reps Elbow Extension: AROM;Strengthening;Both;Theraband;Supine;20 reps    General Comments        Pertinent Vitals/Pain Pain Score: 3  Faces Pain Scale: Hurts even more Pain Location: R hip and back on R side Pain Descriptors / Indicators: Aching;Sharp Pain Intervention(s): Monitored during session;Limited activity within patient's tolerance    Home Living                      Prior Function            PT Goals (current goals can now be found in the care plan section) Progress towards PT goals: Progressing toward goals    Frequency  Min 3X/week      PT Plan Current plan remains appropriate    Co-evaluation              AM-PAC PT "6 Clicks" Daily Activity  Outcome Measure  Difficulty turning over in bed (including adjusting bedclothes, sheets and blankets)?: A Little Difficulty moving from lying on back to sitting on the side of the bed? : A Little Difficulty sitting down on and standing up from a chair with arms (e.g., wheelchair, bedside commode, etc,.)?: A Little Help needed moving to and from a bed to chair (including a wheelchair)?: A Little Help needed walking in hospital room?:  A Little Help needed climbing 3-5 steps with a railing? : A Lot 6 Click Score: 17    End of Session   Activity Tolerance: Patient limited by fatigue Patient left: in bed;with call bell/phone within reach;with family/visitor present Nurse Communication: Other (comment)(need for back check prior to lying down) PT Visit Diagnosis: Difficulty in walking, not elsewhere classified (R26.2);Pain Pain - Right/Left: Right(back and drain sites)     Time: 3254-9826 PT Time Calculation (min) (ACUTE ONLY): 14 min  Charges:  $Gait Training: 8-22 mins                    G CodesMagda [name], [place] [phone] 09/14/2017    [name] 09/14/2017, 1:48 PM

## 2017-09-14 NOTE — Progress Notes (Signed)
PROGRESS NOTE  [name]  PPI:[id number] DOB: Jun 23, 1956 DOA: 08/29/2017   PCP: Ann Held, DO   Brief Narrative:  62 year old with a history of hyperlipidemia, diabetes mellitus type 2, hypothyroidism, idiopathic neutropenia, breast cancer status post bilateral mastectomy, ovarian and endometrial cancer status post colectomy in 2016 and takedown in August 2018 with ileocecal resection" and anal anastomosis and diverting loop ileostomy along with lysis of adhesion, urethral stent exchange with psoas bladder hitch.  CT of her abdomen pelvis showed new colovesicular fistula therefore she was admitted and was also noted to have subacute renal failure.  Nephrology has been following.  Medicine team consulted for diabetes management.  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 - continue with analgesia as needed  DM type II with complications of nephropathy - continue Insulin Glargine 25 U BID - continue SSI as well, insulin Novolog with meal coverage 7 U and night time coverage insulin  - continue oral linagliptin    Depression - stable at this time  - continue Zoloft and Lyrica  - continue trazodone QHS prn  Hypothyroidism - continue Synthroid   Hypomagnesemia and hypokalemia - continue to supplement and repeat electrolytes in AM   AKI on CKD stage III - at baseline Cr 2.32 - Cr overall trending down - repeat BMP in AM  Breast, ovarian and endometrial cancer - continue anastrozole 1 mg  Idiopathic neutropenia, anemia - Continue weekly Granix - WBC is now WNL  - Hg overall stable, no evidence of active bleeding   Hyperlipidemia - continue statin   Increased nutrient needs - low albumin  - continue nutritional supplements   Morbid obesity - meets criteria with BMI > 35 and underlying HLD, DM, CKD - Body mass index is 38.15 kg/m.  Gluteal skin breakdown  - continue barrier cream  Consultants:    Surgery  Urology  PCT  IR  DVT prophylaxis - Lovenox SQ  Procedures and studies: 08/17/2017 1.Cystoscopy with right retrograde pyelogram. 2. Removal of right ureteral stent.  09/02/2017  1.Bilateral nephrostomy tube placement  09/13/2017 Right nephrostogram 1.Right nephrectomy tube intact, no evidence of leak, distal cath well positioned in tight renal pelvis   08/31/2017 CT abdomen pelvis without contrast 1. Rectovesical fistula 2. Probable blind-ending fistulous tract extending into the presacral space from the rectum adjacent to the orifice of the rectovesical fistula 3. Reflux of contrast material into the right kidney via the right ureter with moderate atrophy of the right kidney. Nonobstructive calculi in the lower pole collecting system of the left kidney measuring up to 11 mm.  08/30/2017 renal ultrasound: 1. Small nonobstructive calculi in the collecting systems of both kidneys. 2. Mild to moderate hydronephrosis.  Subjective: Pt reports feeling better but still with intermittent pain, gluteal area,  Objective: Vitals:   09/13/17 1724 09/13/17 2126 09/14/17 0428 09/14/17 0525  BP: (!) 129/47 (!) 124/41  (!) 146/63  Pulse: 66 75  81  Resp: 16 16  17   Temp: 98.6 F (37 C) 98.3 F (36.8 C)  99.2 F (37.3 C)  TempSrc: Oral Oral  Oral  SpO2: 100%   99%  Weight:   94.6 kg (208 lb 8.9 oz)   Height:        Intake/Output Summary (Last 24 hours) at 09/14/2017 1257 Last data filed at 09/14/2017 0953 Gross per 24 hour  Intake 550 ml  Output 2650 ml  Net -2100 ml   Filed Weights   09/12/17 0100 09/13/17  8144 09/14/17 0428  Weight: 76.2 kg (168 lb) 88 kg (194 lb) 94.6 kg (208 lb 8.9 oz)   Physical Exam  Constitutional: Appears well-developed and well-nourished. No distress.  CVS: RRR, S1/S2 +, no murmurs, no gallops, no carotid bruit.  Pulmonary: Effort and breath sounds normal, no stridor, rhonchi, wheezes, rales.  Abdominal: Soft. BS +,  no distension,  tenderness, ostomy in place  Musculoskeletal: Normal range of motion. Trace bilateral LE edema .  Neuro: Alert. Normal reflexes, muscle tone coordination. No cranial nerve deficit.  Data Reviewed:   CBC: Recent Labs  Lab 09/08/17 0355 09/13/17 0335  WBC 2.4* 4.3  NEUTROABS  --  2.4  HGB 7.7* 8.0*  HCT 25.2* 25.5*  MCV 92.6 95.1  PLT 333 818*   Basic Metabolic Panel: Recent Labs  Lab 09/08/17 0355 09/09/17 0457 09/12/17 0329 09/13/17 0335 09/14/17 0410  NA 134* 131*  --   --   --   K 3.8 3.9 4.0 3.7 3.6  CL 107 105  --   --   --   CO2 20* 18*  --   --   --   GLUCOSE 150* 228*  --   --   --   BUN 51* 47*  --   --   --   CREATININE 2.88* 2.65* 2.47* 2.32* 2.30*  CALCIUM 8.5* 8.4*  --   --   --   MG 1.7 1.3* 1.1* 2.0 1.4*  PHOS 5.1* 4.1 4.5  --   --    Liver Function Tests: Recent Labs  Lab 09/08/17 0355 09/09/17 0457  ALBUMIN 1.8* 1.8*   CBG: Recent Labs  Lab 09/13/17 0741 09/13/17 1206 09/13/17 1720 09/13/17 2132 09/14/17 0712  GLUCAP 96 253* 141* 164* 103*    Scheduled Meds: . acetaminophen  1,000 mg Oral TID WC & HS  . anastrozole  1 mg Oral Daily  . artificial tears   Both Eyes QHS  . cholecalciferol  10,000 Units Oral Weekly  . enoxaparin (LOVENOX) injection  30 mg Subcutaneous Q24H  . famotidine  20 mg Oral Daily  . fentaNYL  50 mcg Transdermal Q72H  . ferrous sulfate  325 mg Oral QHS  . Gerhardt's butt cream   Topical QID  . insulin aspart  0-20 Units Subcutaneous TID WC  . insulin aspart  0-5 Units Subcutaneous QHS  . insulin aspart  7 Units Subcutaneous TID WC  . insulin glargine  25 Units Subcutaneous BID  . levothyroxine  150 mcg Oral QAC breakfast  . lidocaine  1 patch Transdermal Q24H  . linagliptin  5 mg Oral Daily  . lip balm  1 application Topical BID  . polyvinyl alcohol  1 drop Both Eyes QHS  . pregabalin  100 mg Oral Daily  . prenatal multivitamin  1 tablet Oral Daily  . protein supplement shake  11 oz Oral BID BM  .  psyllium  1 packet Oral BID  . rosuvastatin  10 mg Oral QPM  . sertraline  50 mg Oral Daily  . Tbo-Filgrastim  480 mcg Subcutaneous Weekly   Continuous Infusions:   LOS: 16 days   Time spent: 35 mins   Faye Ramsay, MD [facility] [phone]  If 7PM-7AM, please contact night-coverage [email or web address] Password [facility] 09/14/2017, 12:57 PM

## 2017-09-15 ENCOUNTER — Inpatient Hospital Stay (HOSPITAL_COMMUNITY): Payer: BLUE CROSS/BLUE SHIELD

## 2017-09-15 DIAGNOSIS — R52 Pain, unspecified: Secondary | ICD-10-CM

## 2017-09-15 LAB — GLUCOSE, CAPILLARY
GLUCOSE-CAPILLARY: 112 mg/dL — AB (ref 65–99)
GLUCOSE-CAPILLARY: 257 mg/dL — AB (ref 65–99)
GLUCOSE-CAPILLARY: 152 mg/dL — AB (ref 65–99)
GLUCOSE-CAPILLARY: 165 mg/dL — AB (ref 65–99)

## 2017-09-15 LAB — CBC
HCT: 24.1 % — ABNORMAL LOW (ref 36.0–46.0)
HEMOGLOBIN: 7.5 g/dL — AB (ref 12.0–15.0)
MCH: 29.5 pg (ref 26.0–34.0)
MCHC: 31.1 g/dL (ref 30.0–36.0)
MCV: 94.9 fL (ref 78.0–100.0)
Platelets: 377 10*3/uL (ref 150–400)
RBC: 2.54 MIL/uL — ABNORMAL LOW (ref 3.87–5.11)
RDW: 16.3 % — ABNORMAL HIGH (ref 11.5–15.5)
WBC: 3.6 10*3/uL — ABNORMAL LOW (ref 4.0–10.5)

## 2017-09-15 LAB — COMPREHENSIVE METABOLIC PANEL
ALBUMIN: 1.9 g/dL — AB (ref 3.5–5.0)
ALK PHOS: 131 U/L — AB (ref 38–126)
ALT: 21 U/L (ref 14–54)
ANION GAP: 8 (ref 5–15)
AST: 15 U/L (ref 15–41)
BUN: 38 mg/dL — ABNORMAL HIGH (ref 6–20)
CHLORIDE: 106 mmol/L (ref 101–111)
CO2: 20 mmol/L — AB (ref 22–32)
CREATININE: 2.03 mg/dL — AB (ref 0.44–1.00)
Calcium: 9 mg/dL (ref 8.9–10.3)
GFR calc Af Amer: 29 mL/min — ABNORMAL LOW (ref >60)
GFR calc non Af Amer: 25 mL/min — ABNORMAL LOW (ref >60)
GLUCOSE: 140 mg/dL — AB (ref 65–99)
Potassium: 4.1 mmol/L (ref 3.5–5.1)
SODIUM: 134 mmol/L — AB (ref 135–145)
Total Bilirubin: 0.3 mg/dL (ref 0.3–1.2)
Total Protein: 6.5 g/dL (ref 6.5–8.1)

## 2017-09-15 LAB — PHOSPHORUS: Phosphorus: 4 mg/dL (ref 2.5–4.6)

## 2017-09-15 LAB — MAGNESIUM: MAGNESIUM: 2.1 mg/dL (ref 1.7–2.4)

## 2017-09-15 MED ORDER — ALTEPLASE 2 MG IJ SOLR
2.0000 mg | Freq: Once | INTRAMUSCULAR | Status: AC
  Administered 2017-09-15: 2 mg
  Filled 2017-09-15: qty 2

## 2017-09-15 MED ORDER — LOPERAMIDE HCL 2 MG PO CAPS: 2.0000 mg | ORAL_CAPSULE | Freq: Three times a day (TID) | ORAL | Status: DC | PRN

## 2017-09-15 NOTE — Progress Notes (Signed)
Progress note  Reason for consult: Pain management  Chart reviewed including MAR.  Discussed with bedside RN.  I saw and examined [name] this evening.  She was lying in bed after returning from CT scan.  Her sister-in-law [name] was at the bedside.  She reports that pain has worsened again today and has only been relieved by IV medication. She reports pain feels "different" than before.  She has already been ordered CT which is pending.  While await results of CT, will continue IV pain medications.  I discussed with her if her CT does not show acute abnormality, will plan to transition back to dilaudid PO and wean off IV meds.  If necessary, will increase dilaudid to 6mg  PO (she reports 4mg  does not work, but 1mg  IV is effective.  As ratio of IV to PO dilaudid is 5:1, 6mg  PO will then be higher opioid equivalent than effective IV dose)  Increase lyrica to 75 mg twice daily.  F/u tomorrow.   Total time: 25 minutes Greater than 50%  of this time was spent counseling and coordinating care related to the above assessment and plan.  Micheline Rough, MD [facility] [phone]

## 2017-09-15 NOTE — Progress Notes (Signed)
Consult-SNF  CSW met with patient at bedside to update about her about SNF and planning ahead for d/c.   -Patient is aware that [facility] does not have any beds avaiblale and patient is currently still on their wait list.  -Patient understands [employer] will have to approve and authorize her SNF stay.   Patient reports she spoke with Loree Fee, the  admission coordinator at [facility]. She is concern [employer] will not allow her to transition from one SNF to antoher or will refuse to pay if she discharges home.   Patient continues to decline other SNF options.  CSW will continue to make self available for patient needs.   Kathrin Greathouse, Latanya Presser, MSW Clinical Social Worker  [phone] 09/15/2017  2:41 PM

## 2017-09-15 NOTE — Progress Notes (Signed)
Physical Therapy Treatment Patient Details Name: [name] MRN: [id number] DOB: 07/16/1956 Today's Date: 09/15/2017    History of Present Illness Pt is a 62 year old female with hx of bilateral mastectomies for breast cancer, chronic neutropenia,  h/o ovarian and endometrial CA, h/o colostomy 2016, w/takedown/loop ileostomy diversion as well as resection of ureteral stricture w/ bladder hitch reimplantation all in 03/2017. Pt had her stent removed 08/17/17 and admitted 08/29/17 for acute renal failure.    PT Comments    Pt assisted with ambulating in hallway and has very slow pace.  Pt reports increased R hip and R drain site pain limiting her mobility.  Plan per chart is for SNF.  Follow Up Recommendations  SNF     Equipment Recommendations  None recommended by PT    Recommendations for Other Services       Precautions / Restrictions Precautions Precautions: Fall Precaution Comments: Multiple lines (catheter, bil PCN tubes)    Mobility  Bed Mobility               General bed mobility comments: pt in chair  Transfers Overall transfer level: Needs assistance Equipment used: Rolling walker (2 wheeled) Transfers: Sit to/from Stand Sit to Stand: Min guard         General transfer comment: min/guard due to pain  Ambulation/Gait Ambulation/Gait assistance: Min guard Ambulation Distance (Feet): 400 Feet Assistive device: Rolling walker (2 wheeled) Gait Pattern/deviations: Step-through pattern;Decreased stride length;Trunk flexed     General Gait Details: required one seated rest break, reports pain worse in R hip and R drain site with movement, occasionally tearful throughout session, provided gentle encouragement   Stairs            Wheelchair Mobility    Modified Rankin (Stroke Patients Only)       Balance                                            Cognition Arousal/Alertness: Awake/alert Behavior During Therapy: WFL for  tasks assessed/performed Overall Cognitive Status: Within Functional Limits for tasks assessed                                 General Comments: emotionally labile      Exercises      General Comments        Pertinent Vitals/Pain Pain Assessment: 0-10 Pain Score: 7  Pain Location: R hip and back on R side ("something is wrong with that drain") Pain Descriptors / Indicators: Sharp Pain Intervention(s): Limited activity within patient's tolerance;Repositioned;Monitored during session;Patient requesting pain meds-RN notified    Home Living                      Prior Function            PT Goals (current goals can now be found in the care plan section) Progress towards PT goals: Progressing toward goals    Frequency    Min 3X/week      PT Plan Current plan remains appropriate    Co-evaluation              AM-PAC PT "6 Clicks" Daily Activity  Outcome Measure  Difficulty turning over in bed (including adjusting bedclothes, sheets and blankets)?: A Little Difficulty moving from lying on back to  sitting on the side of the bed? : A Little Difficulty sitting down on and standing up from a chair with arms (e.g., wheelchair, bedside commode, etc,.)?: A Little Help needed moving to and from a bed to chair (including a wheelchair)?: A Little Help needed walking in hospital room?: A Little Help needed climbing 3-5 steps with a railing? : A Lot 6 Click Score: 17    End of Session   Activity Tolerance: Patient limited by fatigue Patient left: in chair;with nursing/sitter in room;with family/visitor present   PT Visit Diagnosis: Difficulty in walking, not elsewhere classified (R26.2)     Time: 2202-5427 PT Time Calculation (min) (ACUTE ONLY): 32 min  Charges:  $Gait Training: 8-22 mins                    G Codes:       Carmelia Bake, PT, DPT 09/15/2017 Pager: [phone]  York Ram E 09/15/2017, 3:28 PM

## 2017-09-15 NOTE — Progress Notes (Signed)
Referring Physician(s): Manny,T  Supervising Physician: Sandi Mariscal  Patient Status:  [name] - In-pt  Chief Complaint:  Bladder leak  Subjective: Pt still with rt flank/hip pain; has some leaking of urine around left PCN too   Allergies: Penicillins; Ultram [tramadol]; Adhesive [tape]; Cefaclor; Erythromycin; Trimethoprim; Ciprofloxacin; Fluconazole; Oxycodone; Pectin; and Sulfa antibiotics  Medications: Prior to Admission medications   Medication Sig Start Date End Date Taking? Authorizing Provider  anastrozole (ARIMIDEX) 1 MG tablet TAKE 1 TABLET DAILY 12/08/16  Yes Gorsuch, Ni, MD  Biotin 5 MG TABS Take 5 mg by mouth every morning.    Yes [provider]  Cholecalciferol (VITAMIN D3) 10000 UNITS capsule Take 10,000 Units by mouth once a week. Sunday evening's   Yes [provider]  diphenoxylate-atropine (LOMOTIL) 2.5-0.025 MG tablet Take 1-2 tablets by mouth every 8 (eight) hours. TO PREVENT LOOSE BOWEL MOVEMENTS 05/20/17  Yes [provider]  Famotidine-Ca Carb-Mag Hydrox (PEPCID COMPLETE PO) Take 1 tablet by mouth daily as needed (INDIGESTION).   Yes [provider]  filgrastim (NEUPOGEN) 480 MCG/1.6ML injection Inject 1.6 ml under the skin every 5 days for life Patient taking differently: Inject 480 mcg into the skin See admin instructions. Inject 1.6 ml under the skin every 6 days for life 01/06/17  Yes Gorsuch, Ni, MD  levothyroxine (SYNTHROID, LEVOTHROID) 150 MCG tablet Take 150 mcg by mouth daily before breakfast.    Yes [provider]  loratadine (CLARITIN) 10 MG tablet Take 10 mg by mouth every morning.    Yes [provider]  omega-3 acid ethyl esters (LOVAZA) 1 G capsule Take 1 g by mouth 2 (two) times daily.    Yes [provider]  ondansetron (ZOFRAN-ODT) 4 MG disintegrating tablet Take 4 mg by mouth every 6 (six) hours as needed for nausea or vomiting.  04/12/17  Yes [provider]  Polyethyl  Glycol-Propyl Glycol (SYSTANE OP) Place 1 drop into both eyes at bedtime.    Yes [provider]  Prenatal Vit-Fe Fumarate-FA (PRENATAL VITAMIN PO) Take 1 capsule by mouth daily. Takes prenatal because there are no dyes in it   Yes [provider]  prochlorperazine (COMPAZINE) 10 MG tablet Take 1 tablet (10 mg total) by mouth 2 (two) times daily as needed for nausea or vomiting. Patient taking differently: Take 10 mg by mouth 2 (two) times daily as needed for nausea or vomiting.  05/28/17  Yes Virgel Manifold, MD  promethazine (PHENERGAN) 25 MG tablet Take 1 tablet (25 mg total) by mouth every 6 (six) hours as needed for nausea or vomiting. 05/28/17  Yes Virgel Manifold, MD  ranitidine (ZANTAC) 150 MG tablet Take 150 mg by mouth 2 (two) times daily as needed for heartburn.    Yes [provider]  rosuvastatin (CRESTOR) 10 MG tablet Take 10 mg by mouth every evening.    Yes [provider]  sitaGLIPtin (JANUVIA) 100 MG tablet Take 100 mg by mouth every morning.    Yes [provider]  HYDROcodone-acetaminophen (NORCO/VICODIN) 5-325 MG tablet Take 1 tablet by mouth every 6 (six) hours as needed (Pain). Patient not taking: Reported on 08/29/2017 08/10/17   Shelda Pal, DO     Vital Signs: BP (!) 144/62 (BP Location: Right Leg)   Pulse 83   Temp 98.5 F (36.9 C) (Oral)   Resp 16   Ht _0  (1.575 m)   Wt 201 lb 1 oz (91.2 kg)   SpO2 99%  BMI 36.77 kg/m   Physical Exam both PCN's currently intact, L/R outputs  170/250 cc yellow urine respectively; left PCN dressing sl saturated; rt flank pain with movement of PCN  Imaging: Ir Nephrostogram Right Thru Existing Access  Result Date: 09/13/2017 INDICATION: History of bilateral percutaneous nephrostomy tube placement on 09/02/2017 to divert urine due to fistula between the rectum and bladder. After recent fall, the patient is complaining of significant pain in the right flank region near the  site of the indwelling right nephrostomy tube. EXAM: RIGHT NEPHROSTOGRAM COMPARISON:  Imaging at the time of nephrostomy tube placement on 09/02/2017 MEDICATIONS: None ANESTHESIA/SEDATION: None CONTRAST:  5 mL Isovue-300-administered into the collecting system(s) FLUOROSCOPY TIME:  Fluoroscopy Time: 12 seconds.  3.8 mGy. COMPLICATIONS: None immediate. PROCEDURE: Under fluoroscopy, contrast material was injected via the pre-existing right percutaneous nephrostomy tube. Fluoroscopic images were saved as a cine loop. The catheter was flushed and reattached to a gravity drainage bag. FINDINGS: The percutaneous nephrostomy tube is intact without evidence of catheter rupture or contrast extravasation. The catheter is formed at the level of the renal pelvis and is well positioned. No significant filling defects are identified in the renal collecting system or opacified ureter. IMPRESSION: Unremarkable right nephrostogram via pre-existing percutaneous nephrostomy tube demonstrating intact nephrostomy tube which is positioned in the right renal pelvis. Electronically Signed   By: Aletta Edouard M.D.   On: 09/13/2017 16:10    Labs:  CBC: Recent Labs    09/07/17 0324 09/08/17 0355 09/13/17 0335 09/15/17 0345  WBC 4.2 2.4* 4.3 3.6*  HGB 8.2* 7.7* 8.0* 7.5*  HCT 26.1* 25.2* 25.5* 24.1*  PLT 321 333 436* 377    COAGS: Recent Labs    04/16/17 0047 09/02/17 0939  INR 1.21 1.21    BMP: Recent Labs    09/07/17 0324 09/08/17 0355 09/09/17 0457 09/12/17 0329 09/13/17 0335 09/14/17 0410 09/15/17 0345  NA 131* 134* 131*  --   --   --  134*  K 3.7 3.8 3.9 4.0 3.7 3.6 4.1  CL 102 107 105  --   --   --  106  CO2 21* 20* 18*  --   --   --  20*  GLUCOSE 114* 150* 228*  --   --   --  140*  BUN 47* 51* 47*  --   --   --  38*  CALCIUM 8.5* 8.5* 8.4*  --   --   --  9.0  CREATININE 3.23* 2.88* 2.65* 2.47* 2.32* 2.30* 2.03*  GFRNONAA 14* 16* 18* 20* 21* 21* 25*  GFRAA 16* 19* 21* 23* 25* 25* 29*     LIVER FUNCTION TESTS: Recent Labs    04/21/17 0416 05/27/17 1724 08/29/17 1550  09/07/17 0324 09/08/17 0355 09/09/17 0457 09/15/17 0345  BILITOT 0.5 0.3 0.5  --   --   --   --  0.3  AST 13* 24 16  --   --   --   --  15  ALT 10* 33 24  --   --   --   --  21  ALKPHOS 190* 166* 173*  --   --   --   --  131*  PROT 5.9* 9.2* 6.8  --   --   --   --  6.5  ALBUMIN 1.8* 3.9 2.1*   < > 1.9* 1.8* 1.8* 1.9*   < > = values in this interval not displayed.    Assessment and Plan: Ptwith hx  met endom ca, post op bladder injury/ leak (rectal bladder fistula), s/p diverting bilat PCN's 2/1;afebrile; WBC 3.6, hgb 7.5(8.0), creat 2.03(2.3); case discussed with Dr. Pascal Lux today and with persistent right flank pain will obtain CT abdomen/ pelvis without contrast for further evaluation.  Plans discussed with patient.     Electronically Signed: D. Rowe Robert, PA-C 09/15/2017, 4:27 PM   I spent a total of 15 minutes at the the patient's bedside AND on the patient's hospital floor or unit, greater than 50% of which was counseling/coordinating care for bilateral percutaneous nephrostomies    Patient ID: Doretha Imus, female   DOB: 03/19/1956, 62 y.o.   MRN: [id number]

## 2017-09-15 NOTE — Progress Notes (Signed)
Nutrition Follow-up  DOCUMENTATION CODES:   Obesity unspecified  INTERVENTION:   Continue Premier Protein BID, each supplement provides 160 kcal and 30 grams of protein.   NUTRITION DIAGNOSIS:   Increased nutrient needs related to post-op healing as evidenced by estimated needs.  Ongoing.  GOAL:   Patient will meet greater than or equal to 90% of their needs  Meeting.  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   Pt continues to eat well and tolerate her diet. Pt consuming 75-100% of her meals and is drinking at least 1 Premier Protein daily.  Weight continues to fluctuate, now +19 lb since 1/29. Per surgery note, pt to discharge to SNF.  Medications: Ferrous sulfate daily, Prenatal MVI daily, Metamucil packet BID Labs reviewed: CBGs: 112-257 Low Na GFR: 25   Diet Order:  Skin care precautions Diet Carb Modified Fluid consistency: Thin; Room service appropriate? Yes  EDUCATION NEEDS:   Education needs have been addressed(Will provide handout at follow-up)  Skin:  Skin Assessment: Skin Integrity Issues: Skin Integrity Issues:: Stage I, Stage II Stage I: buttocks Stage II: buttocks  Last BM:  2/14  Height:   Ht Readings from Last 1 Encounters:  09/05/17 5\' 2"  (1.575 m)    Weight:   Wt Readings from Last 1 Encounters:  09/15/17 201 lb 1 oz (91.2 kg)    Ideal Body Weight:  52.3 kg  BMI:  Body mass index is 36.77 kg/m.  Estimated Nutritional Needs:   Kcal:  1650-1850  Protein:  65-75g  Fluid:  1.8L/day  [name] Bibles, MS, RD, LDN [facility] Pager: [phone] After Hours Pager: [phone]

## 2017-09-15 NOTE — Progress Notes (Signed)
[address] [name], [place] [address] Phone: [phone]  FAX: [phone]      Shyrl Obi [id number] 1956/01/03  CARE TEAM:  PCP: Ann Held, DO  Outpatient Care Team: Patient Care Team: Carollee Herter, Alferd Apa, DO as PCP - General (Family Medicine) Heath Lark, MD as Consulting Physician (Hematology and Oncology) Everitt Amber, MD as Consulting Physician (Obstetrics and Gynecology) Michael [name], MD as Consulting Physician (General Surgery) Alexis Frock, MD as Consulting Physician (Urology) Altheimer, Legrand Como, MD as Consulting Physician (Endocrinology) Katy Apo, MD as Consulting Physician (Ophthalmology) Irene Limbo, MD as Consulting Physician (Plastic Surgery) Madelon Lips, MD as Consulting Physician (Nephrology)  Inpatient Treatment Team: Treatment Team: Attending Provider: Theodis Blaze, MD; Consulting Physician: Alexis Frock, MD; Registered Nurse: Illene Regulus, RN; Technician: Reuel Derby, NT; Consulting Physician: Michael [name], MD; Technician: Leda Quail, NT; Technician: Estrellita Ludwig, NT; Rounding Team: Dorthy Cooler Radiology, MD; Technician: Sueanne Margarita, [place]; Consulting Physician: Faythe Dingwall, DO; Technician: Etheleen Sia, [place]; Attending Physician: Theodis Blaze, MD; Rounding Team: Fatima Blank, MD; Occupational Therapist: [name], OT; Physical Therapist: Junius Argyle, PT   Problem List:   Principal Problem:   MDD (major depressive disorder), single episode, moderate ([place]) Active Problems:   Chronic kidney disease (CKD), stage III (moderate) ([place])   Colovesical fistula - delayed   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 s/p resection/partial vaginectomy/ LAR/colostomy 11/19/2014   Ureteral stricture, right, 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) 04/01/2017    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   Primary hypothyroidism   DM type 2 (diabetes mellitus, type 2) ([place])   History of ovarian & endometrial cancer   Morbid obesity ([place])   Pelvic cancer s/p colostomy takedown/loop ileostomy diversion 04/01/2017   Protein-calorie malnutrition, moderate ([place])   Dehydration   ARF (acute renal failure) ([place])   Adjustment disorder with mixed anxiety and depressed mood   Poorly controlled diabetes mellitus ([place])   Pressure injury of skin   History of external beam radiation therapy to pelvis  SURGERY 04/01/2017     POST-OPERATIVE DIAGNOSIS:   Recurrent endometrial cancer status post low anterior resection and partial pelvic exeneration  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   SURGEON:Paralee Pendergrass Gwynneth Aliment, MD- Primary (Dr Tresa Moore assist) PROCEDURE:  XI ROBOTIC LYSIS OF ADHESIONS x 5 hours XI ROBOTIC COLOSTOMY TAKEDOWN ROBOTIC SEWN COLOANAL ANASTOMOSIS Ileocecal resection with anastomosis Diverting loop ileostomy  SURGEON: Alexis Frock, MD - Primary (Dr Johney Maine assist) PROCEDURE:  Cystoscopy with R retrograde and stent exchange.  Robotic R ureteral reimplant with psoas bladder hitch.  Consults: urology   SURGERY 08/17/2017  DIAGNOSIS:  Right ureteral stricture, status post complex reconstruction.  PROCEDURES: 1. Cystoscopy with right retrograde pyelogram. 2. Removal of right ureteral stent.  PHYSICIAN:  Alexis Frock, MD        SPECIMENS:  Right ureteral stone for discard, inspected and intact.  FINDINGS: 1. Fusiform bladder shape consistent with prior psoas hitch. 2. Visibly patent ureteral-bladder anastomosis. 3. No evidence of hydronephrosis or narrowing with right retrograde     pyelogram.    Assessment  Struggling with pain control  Delayed bladder leak turned into colovesical fistula with preferential urinary drainage in decompressed diverted colon limb - diverted urine w bilateral nephrostomy tubes &  foley.  .  Plan:   Gradually improving pain control.  Palliative care following closely.  Fentanyl patch for now given her  renal insufficiency.  Still needing IV pain meds.  Lidocaine patch may be helping pain on right back nephrostomy tube site.    Oral Dilaudid dose helps at 4 mg only.  Inc Scheduled Tylenol to QID.  Lyrica for chronic pain / depression - increased from 75 to 120m. ?Switch BID - defer to Palliative care.  Robaxin PRN.     Pain at R back nephrostomy tube site - Xrays w/o fracture.  Nephrostomy tube injection showing no leak or abnormality.  Urology and interventional radiology feel like it is in good position but may do new drain to mitigate possiblitiy of pinched intercostal nerve.  Change dressing more often.    Gluteal skin breakdown - stabilized & improved.  Continue barrier cream.  rinary diversion as much as possible - still not 100% with anal leakage.  Powders PRN.  GET HER UP to avoid deeper decubitus formation  Colovesical fistula due to bladder breakdown & delayed leak.  Urinary diversion s/p percutaneous nephrostomy tubes to divert urine directly.  Foley to catch any more urine.  Still leaking urine output anus.  Somewhat improved but fair.  Suspect Ileal conduit imminent   Follow off antibiotics  Mag normalizing.  K & Phos OK.  Follow  Zoloft for depression.  Psychiatric evaluation done - added trazadone QHS & rec PCP f/u.  Psych f/u PRN  Diabetes better controlled.  D/w TRH.  Continue lantus Bid & PO.  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  Iron deficiency anemia with borderline hemoglobin.  Given another dose of IV dextran this admission.  Oral iron - QHS.  She had difficulty absorbing and tolerating.  With urine leak better controlled, perhaps she can tolerate better now  Solid diet.  Bowel regimen.  Output under control w/o massive antidiarrheals.    Ostomy care - not major issues w ileostomy  Continue neupogen injections for  pancytopenia  VTE prophylaxis: SCDs, etc  Ambulate as tolerated to help recovery    Leak on the left lateral inferiolateral corner of the cystostomy Heineke-Mikulicz and psoas hitch needed for the bladder to reach the right ureter at that had the chronic severe distal stricture.  I am highly skeptical that a radiated bladder with a delayed leak will spontaneously heal.  She had internal Foley drainage, external surgical drainage, internal stenting drainage for a few months, yet this happened. Therefore, most likely will require surgical repair.  Standard of care usually on this situation is to resect colon & do a new distal anastomosis. Primarily repair the bladder & patch it.  However, there is no way I can redo anastomosis in a radiated pelvis with prior surgeries with prior anterior exoneration, radiation, 7-hour lysis adhesions and prior surgery when she was in better shape.  Only options I can see is to try & primarily patch the colon with OMENTOPEXY, primarily repair the bladder with OMENTOPEXY, and hope it will heal.  I am skeptical that it will.  Most likely we will have to convert to an end colostomy to get the colon away from it and allow the bladder to be patched vs cystectomy & ileal conduit.  I do agree with Dr. MTresa Moorethat she is rather malnourished and deconditioned at this point so would like to try and hold off on definitive repair if we can.  We will discuss that later once she is in better shape to actually tolerate a reoperation should it come to that (very likely).  Home health nursing seems  to have failed.  Lack of outpatient ostomy clinic in town here making outpatient management challenging.   I strongly suspect she will need a SNF.  Patient more motivated to get up and move around.  She is still embarrassed in frustrated with persistent urine leak out anus.  But she is getting up some.  Work to try and transition to outpatient rehab with SNF soon.  Needs better pain  control     40 minutes spent in review, evaluation, examination, counseling, and coordination of care.  More than 50% of that time was spent in counseling.  [name], M.D., F.A.C.S. Gastrointestinal and Minimally Invasive Surgery [facility] [address] [place], [place] [address] [phone] Main / Paging    09/15/2017    Subjective: (Chief complaint)  Tired Not crying Still with gluteal pain but not as severe R back pain at drain site  Walked in hallway yesterday w moderate help Tolerating food OK.  Objective:  Vital signs:  Vitals:   09/14/17 0525 09/14/17 1452 09/14/17 2132 09/15/17 0554  BP: (!) 146/63 (!) 134/43 (!) 142/78 (!) 117/58  Pulse: 81 75 80 83  Resp: _0 Temp: 99.2 F (37.3 C) 99 F (37.2 C) 98.6 F (37 C) 97.8 F (36.6 C)  TempSrc: Oral Oral Oral Oral  SpO2: 99% 99% 100% 99%  Weight:    91.2 kg (201 lb 1 oz)  Height:        Last BM Date: 09/14/17  Intake/Output   Yesterday:  02/13 0701 - 02/14 0700 In: 780 [P.O.:780] Out: 3720 [Urine:2295; Stool:1425] This shift:  No intake/output data recorded.  Bowel function:  Flatus: YES   BM:  YES.  Thick oatmeal consistency succus in ileostomy bag.  Drain: No drains.     Physical Exam:  General: Pt awake/alert/oriented x4 in mild acute distress. Eyes: PERRL, normal EOM.  Sclera clear.  No icterus Neuro: CN II-XII intact w/o focal sensory/motor deficits. Lymph: No head/neck/groin lymphadenopathy Psych:  No delerium/psychosis/paranoia.  Crying & tired.  Somewhat consolable HENT: Normocephalic, Mucus membranes moist.  No thrush Neck: Supple, No tracheal deviation Chest: No chest wall pain w good excursion CV:  Pulses intact.  Regular rhythm MS: Normal AROM mjr joints.  No obvious deformity.  Bilateral perc nephrostomy bags - R posterior sharp pain at skin site but no cellultitis  Abdomen: Soft.  Nondistended.  No tenderness to  palpation.  No evidence of peritonitis.  No incarcerated hernias.  No wounds.  Ileostomy pink with gas and succus in bag.      GU.  No purulent drainage or hematoma.  Foley in place with clear light yellow urine  Rectal.  Gluteal skin breakdown regressing/healing.  No more necrosis.  Improved.  Buttpaste barrier cream.  Ext:   No deformity.  No mjr edema.  No cyanosis Skin: No petechiae / purpura  Results:   Labs: Results for orders placed or performed during the hospital encounter of 08/29/17 (from the past 48 hour(s))  Glucose, capillary     Status: None   Collection Time: 09/13/17  7:41 AM  Result Value Ref Range   Glucose-Capillary 96 65 - 99 mg/dL  Glucose, capillary     Status: Abnormal   Collection Time: 09/13/17 12:06 PM  Result Value Ref Range   Glucose-Capillary 253 (H) 65 - 99 mg/dL  Glucose, capillary     Status: Abnormal   Collection Time: 09/13/17  5:20 PM  Result  Value Ref Range   Glucose-Capillary 141 (H) 65 - 99 mg/dL  Glucose, capillary     Status: Abnormal   Collection Time: 09/13/17  9:32 PM  Result Value Ref Range   Glucose-Capillary 164 (H) 65 - 99 mg/dL  Potassium     Status: None   Collection Time: 09/14/17  4:10 AM  Result Value Ref Range   Potassium 3.6 3.5 - 5.1 mmol/L    Comment: Performed at [name], [place] [address], [place], [place] [address]  Creatinine, serum     Status: Abnormal   Collection Time: 09/14/17  4:10 AM  Result Value Ref Range   Creatinine, Ser 2.30 (H) 0.44 - 1.00 mg/dL   GFR calc non Af Amer 21 (L) >60 mL/min   GFR calc Af Amer 25 (L) >60 mL/min    Comment: (NOTE) The eGFR has been calculated using the CKD EPI equation. This calculation has not been validated in all clinical situations. eGFR's persistently <60 mL/min signify possible Chronic Kidney Disease. Performed at [facility], [place] [address] [place], [place] [address]   Magnesium     Status: Abnormal   Collection Time:  09/14/17  4:10 AM  Result Value Ref Range   Magnesium 1.4 (L) 1.7 - 2.4 mg/dL    Comment: Performed at [facility], [place] [address] [place], [place] [address]  Glucose, capillary     Status: Abnormal   Collection Time: 09/14/17  7:12 AM  Result Value Ref Range   Glucose-Capillary 103 (H) 65 - 99 mg/dL  Glucose, capillary     Status: Abnormal   Collection Time: 09/14/17 11:51 AM  Result Value Ref Range   Glucose-Capillary 224 (H) 65 - 99 mg/dL  Glucose, capillary     Status: None   Collection Time: 09/14/17  4:28 PM  Result Value Ref Range   Glucose-Capillary 95 65 - 99 mg/dL  Glucose, capillary     Status: Abnormal   Collection Time: 09/14/17  9:25 PM  Result Value Ref Range   Glucose-Capillary 161 (H) 65 - 99 mg/dL  Magnesium     Status: None   Collection Time: 09/15/17  3:45 AM  Result Value Ref Range   Magnesium 2.1 1.7 - 2.4 mg/dL    Comment: Performed at [facility], [place] [address] [place], [place] [address]  Comprehensive metabolic panel     Status: Abnormal   Collection Time: 09/15/17  3:45 AM  Result Value Ref Range   Sodium 134 (L) 135 - 145 mmol/L   Potassium 4.1 3.5 - 5.1 mmol/L   Chloride 106 101 - 111 mmol/L   CO2 20 (L) 22 - 32 mmol/L   Glucose, Bld 140 (H) 65 - 99 mg/dL   BUN 38 (H) 6 - 20 mg/dL   Creatinine, Ser 2.03 (H) 0.44 - 1.00 mg/dL   Calcium 9.0 8.9 - 10.3 mg/dL   Total Protein 6.5 6.5 - 8.1 g/dL   Albumin 1.9 (L) 3.5 - 5.0 g/dL   AST 15 15 - 41 U/L   ALT 21 14 - 54 U/L   Alkaline Phosphatase 131 (H) 38 - 126 U/L   Total Bilirubin 0.3 0.3 - 1.2 mg/dL   GFR calc non Af Amer 25 (L) >60 mL/min   GFR calc Af Amer 29 (L) >60 mL/min    Comment: (NOTE) The eGFR has been calculated using the CKD EPI equation. This calculation has not been validated in all clinical situations. eGFR's  persistently <60 mL/min signify possible Chronic Kidney Disease.    Anion gap 8 5 - 15    Comment: Performed at [facility], [place] [address], [place], [place] [address]  CBC     Status: Abnormal   Collection Time: 09/15/17  3:45 AM  Result Value Ref Range   WBC 3.6 (L) 4.0 - 10.5 K/uL   RBC 2.54 (L) 3.87 - 5.11 MIL/uL   Hemoglobin 7.5 (L) 12.0 - 15.0 g/dL   HCT 24.1 (L) 36.0 - 46.0 %   MCV 94.9 78.0 - 100.0 fL   MCH 29.5 26.0 - 34.0 pg   MCHC 31.1 30.0 - 36.0 g/dL   RDW 16.3 (H) 11.5 - 15.5 %   Platelets 377 150 - 400 K/uL    Comment: Performed at [facility], [place] [address] [place], [place] [address]  Phosphorus     Status: None   Collection Time: 09/15/17  3:45 AM  Result Value Ref Range   Phosphorus 4.0 2.5 - 4.6 mg/dL    Comment: Performed at [facility], [place] [address] [place], [place] [address]    Imaging / Studies: Ir Nephrostogram Right Thru Existing Access  Result Date: 09/13/2017 INDICATION: History of bilateral percutaneous nephrostomy tube placement on 09/02/2017 to divert urine due to fistula between the rectum and bladder. After recent fall, the patient is complaining of significant pain in the right flank region near the site of the indwelling right nephrostomy tube. EXAM: RIGHT NEPHROSTOGRAM COMPARISON:  Imaging at the time of nephrostomy tube placement on 09/02/2017 MEDICATIONS: None ANESTHESIA/SEDATION: None CONTRAST:  5 mL Isovue-300-administered into the collecting system(s) FLUOROSCOPY TIME:  Fluoroscopy Time: 12 seconds.  3.8 mGy. COMPLICATIONS: None immediate. PROCEDURE: Under fluoroscopy, contrast material was injected via the pre-existing right percutaneous nephrostomy tube. Fluoroscopic images were saved as a cine loop. The catheter was flushed and reattached to a gravity drainage bag. FINDINGS: The percutaneous nephrostomy tube is intact without evidence of catheter rupture or contrast extravasation. The catheter is formed at the level of the renal pelvis and is well positioned. No significant filling defects are identified in  the renal collecting system or opacified ureter. IMPRESSION: Unremarkable right nephrostogram via pre-existing percutaneous nephrostomy tube demonstrating intact nephrostomy tube which is positioned in the right renal pelvis. Electronically Signed   By: Aletta Edouard M.D.   On: 09/13/2017 16:10    Medications / Allergies: per chart  Antibiotics: Anti-infectives (From admission, onward)   Start     Dose/Rate Route Frequency Ordered Stop   08/30/17 1000  ciprofloxacin (CIPRO) IVPB 200 mg  Status:  Discontinued     200 mg 100 mL/hr over 60 Minutes Intravenous Every 24 hours 08/30/17 0855 09/06/17 0805        Note: Portions of this report may have been transcribed using voice recognition software. Every effort was made to ensure accuracy; however, inadvertent computerized transcription errors may be present.   Any transcriptional errors that result from this process are unintentional.     [name], M.D., F.A.C.S. Gastrointestinal and Minimally Invasive Surgery [facility] [address], [place], [place] [address] [phone] Main / Paging    09/15/2017

## 2017-09-15 NOTE — Progress Notes (Signed)
PROGRESS NOTE  [name]  GDJ:[id number] DOB: 1956-06-26 DOA: 08/29/2017   PCP: Ann Held, DO   Brief Narrative:  62 year old with a history of hyperlipidemia, diabetes mellitus type 2, hypothyroidism, idiopathic neutropenia, breast cancer status post bilateral mastectomy, ovarian and endometrial cancer status post colectomy in 2016 and takedown in August 2018 with ileocecal resection" and anal anastomosis and diverting loop ileostomy along with lysis of adhesion, urethral stent exchange with psoas bladder hitch.  CT of her abdomen pelvis showed new colovesicular fistula therefore she was admitted and was also noted to have subacute renal failure.  Nephrology has been following.  Medicine team consulted for diabetes management.  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 - pain controlled this AM - continue analgesia as needed   DM type II with complications of nephropathy - continue Insulin Glargine 25 U BID - continue SSI as well, insulin Novolog with meal coverage 7 U and night time coverage insulin  - continue oral linagliptin   - no changes in the regimen needed at this time   Depression - stable this AM - continue Zoloft and Lyrica - continue Trazodone QHS  Hyponatremia - improving  - BMP in AM  Hypothyroidism - continue synthroid   Hypomagnesemia and hypokalemia - supplemented and now WNL - will monitor electrolytes   AKI on CKD stage III - at baseline Cr 2.32 - Cr overall trending down - BMP in AM  Breast, ovarian and endometrial cancer - continue anastrozole 1 mg  Idiopathic neutropenia, anemia - Continue weekly Granix - Hg drop noted but no evidence of active bleeding - will repeat CBC in AM  Hyperlipidemia - continue statin   Increased nutrient needs - low albumin  - continue nutritional supplements   Morbid obesity - meets criteria with BMI > 35 and underlying HLD, DM, CKD - Body mass index is 38.15  kg/m.  Gluteal skin breakdown  - continue barrier cream  Consultants:   Surgery  Urology  PCT  IR  DVT prophylaxis - Lovenox SQ  Procedures and studies: 08/17/2017 1.Cystoscopy with right retrograde pyelogram. 2. Removal of right ureteral stent.  09/02/2017  1.Bilateral nephrostomy tube placement  09/13/2017 Right nephrostogram 1.Right nephrectomy tube intact, no evidence of leak, distal cath well positioned in tight renal pelvis   08/31/2017 CT abdomen pelvis without contrast 1. Rectovesical fistula 2. Probable blind-ending fistulous tract extending into the presacral space from the rectum adjacent to the orifice of the rectovesical fistula 3. Reflux of contrast material into the right kidney via the right ureter with moderate atrophy of the right kidney. Nonobstructive calculi in the lower pole collecting system of the left kidney measuring up to 11 mm.  08/30/2017 renal ultrasound: 1. Small nonobstructive calculi in the collecting systems of both kidneys. 2. Mild to moderate hydronephrosis.  Subjective: Pt reports feeling better this AM,   Objective: Vitals:   09/14/17 0525 09/14/17 1452 09/14/17 2132 09/15/17 0554  BP: (!) 146/63 (!) 134/43 (!) 142/78 (!) 117/58  Pulse: 81 75 80 83  Resp: 17 17 18 18   Temp: 99.2 F (37.3 C) 99 F (37.2 C) 98.6 F (37 C) 97.8 F (36.6 C)  TempSrc: Oral Oral Oral Oral  SpO2: 99% 99% 100% 99%  Weight:    91.2 kg (201 lb 1 oz)  Height:        Intake/Output Summary (Last 24 hours) at 09/15/2017 1221 Last data filed at 09/15/2017 1039 Gross per 24 hour  Intake 900 ml  Output 3870 ml  Net -2970 ml   Filed Weights   09/13/17 0509 09/14/17 0428 09/15/17 0554  Weight: 88 kg (194 lb) 94.6 kg (208 lb 8.9 oz) 91.2 kg (201 lb 1 oz)   Physical Exam  Constitutional: Appears well-developed and well-nourished. No distress.  CVS: RRR, S1/S2 +, no murmurs, no gallops, no carotid bruit.  Pulmonary: Effort and breath sounds  normal, no stridor, diminished breath sounds at bases  Abdominal: Soft. BS +,  no distension, ostomy bag in place Musculoskeletal: Normal range of motion. No edema and no tenderness.  Neuro: Alert. Normal reflexes, muscle tone coordination. No cranial nerve deficit.  Data Reviewed:   CBC: Recent Labs  Lab 09/13/17 0335 09/15/17 0345  WBC 4.3 3.6*  NEUTROABS 2.4  --   HGB 8.0* 7.5*  HCT 25.5* 24.1*  MCV 95.1 94.9  PLT 436* 540   Basic Metabolic Panel: Recent Labs  Lab 09/09/17 0457 09/12/17 0329 09/13/17 0335 09/14/17 0410 09/15/17 0345  NA 131*  --   --   --  134*  K 3.9 4.0 3.7 3.6 4.1  CL 105  --   --   --  106  CO2 18*  --   --   --  20*  GLUCOSE 228*  --   --   --  140*  BUN 47*  --   --   --  38*  CREATININE 2.65* 2.47* 2.32* 2.30* 2.03*  CALCIUM 8.4*  --   --   --  9.0  MG 1.3* 1.1* 2.0 1.4* 2.1  PHOS 4.1 4.5  --   --  4.0   Liver Function Tests: Recent Labs  Lab 09/09/17 0457 09/15/17 0345  AST  --  15  ALT  --  21  ALKPHOS  --  131*  BILITOT  --  0.3  PROT  --  6.5  ALBUMIN 1.8* 1.9*   CBG: Recent Labs  Lab 09/14/17 1151 09/14/17 1628 09/14/17 2125 09/15/17 0744 09/15/17 1155  GLUCAP 224* 95 161* 112* 257*    Scheduled Meds: . acetaminophen  1,000 mg Oral TID WC & HS  . anastrozole  1 mg Oral Daily  . artificial tears   Both Eyes QHS  . cholecalciferol  10,000 Units Oral Weekly  . enoxaparin (LOVENOX) injection  30 mg Subcutaneous Q24H  . famotidine  20 mg Oral Daily  . fentaNYL  50 mcg Transdermal Q72H  . ferrous sulfate  325 mg Oral QHS  . Gerhardt's butt cream   Topical QID  . insulin aspart  0-20 Units Subcutaneous TID WC  . insulin aspart  0-5 Units Subcutaneous QHS  . insulin aspart  7 Units Subcutaneous TID WC  . insulin glargine  25 Units Subcutaneous BID  . levothyroxine  150 mcg Oral QAC breakfast  . lidocaine  1 patch Transdermal Q24H  . linagliptin  5 mg Oral Daily  . lip balm  1 application Topical BID  . polyvinyl  alcohol  1 drop Both Eyes QHS  . pregabalin  100 mg Oral Daily  . prenatal multivitamin  1 tablet Oral Daily  . protein supplement shake  11 oz Oral BID BM  . psyllium  1 packet Oral BID  . rosuvastatin  10 mg Oral QPM  . sertraline  50 mg Oral Daily  . Tbo-Filgrastim  480 mcg Subcutaneous Weekly   Continuous Infusions:   LOS: 17 days   Time spent: 25 mins   25 minutes  with > 50% of time discussing current diagnostic test results, clinical impression and plan of care.  Faye Ramsay, MD [facility] [phone]  If 7PM-7AM, please contact night-coverage [email or web address] Password [facility] 09/15/2017, 12:21 PM

## 2017-09-16 ENCOUNTER — Inpatient Hospital Stay (HOSPITAL_COMMUNITY): Payer: BLUE CROSS/BLUE SHIELD

## 2017-09-16 ENCOUNTER — Encounter (HOSPITAL_COMMUNITY): Payer: Self-pay | Admitting: Radiology

## 2017-09-16 LAB — GLUCOSE, CAPILLARY
GLUCOSE-CAPILLARY: 149 mg/dL — AB (ref 65–99)
GLUCOSE-CAPILLARY: 103 mg/dL — AB (ref 65–99)
Glucose-Capillary: 97 mg/dL (ref 65–99)
Glucose-Capillary: 180 mg/dL — ABNORMAL HIGH (ref 65–99)

## 2017-09-16 LAB — BASIC METABOLIC PANEL
ANION GAP: 9 (ref 5–15)
BUN: 40 mg/dL — ABNORMAL HIGH (ref 6–20)
CALCIUM: 8.9 mg/dL (ref 8.9–10.3)
CHLORIDE: 108 mmol/L (ref 101–111)
CO2: 19 mmol/L — AB (ref 22–32)
Creatinine, Ser: 2.13 mg/dL — ABNORMAL HIGH (ref 0.44–1.00)
GFR calc non Af Amer: 24 mL/min — ABNORMAL LOW (ref >60)
GFR, EST AFRICAN AMERICAN: 27 mL/min — AB (ref >60)
GLUCOSE: 172 mg/dL — AB (ref 65–99)
POTASSIUM: 4.2 mmol/L (ref 3.5–5.1)
Sodium: 136 mmol/L (ref 135–145)

## 2017-09-16 LAB — PHOSPHORUS: Phosphorus: 4.3 mg/dL (ref 2.5–4.6)

## 2017-09-16 LAB — CBC
HEMATOCRIT: 22.7 % — AB (ref 36.0–46.0)
HEMOGLOBIN: 7 g/dL — AB (ref 12.0–15.0)
MCH: 29.4 pg (ref 26.0–34.0)
MCHC: 30.8 g/dL (ref 30.0–36.0)
MCV: 95.4 fL (ref 78.0–100.0)
Platelets: 342 10*3/uL (ref 150–400)
RBC: 2.38 MIL/uL — AB (ref 3.87–5.11)
RDW: 16.5 % — AB (ref 11.5–15.5)
WBC: 4.2 10*3/uL (ref 4.0–10.5)

## 2017-09-16 LAB — MAGNESIUM: Magnesium: 1.6 mg/dL — ABNORMAL LOW (ref 1.7–2.4)

## 2017-09-16 MED ORDER — MIDAZOLAM HCL 2 MG/2ML IJ SOLN
INTRAMUSCULAR | Status: AC | PRN
  Administered 2017-09-16 (×4): 1 mg via INTRAVENOUS

## 2017-09-16 MED ORDER — PREGABALIN 75 MG PO CAPS
75.0000 mg | ORAL_CAPSULE | Freq: Two times a day (BID) | ORAL | Status: DC
  Administered 2017-09-16 – 2017-09-25 (×18): 75 mg via ORAL
  Filled 2017-09-16 (×18): qty 1

## 2017-09-16 MED ORDER — FENTANYL 75 MCG/HR TD PT72: 75.0000 ug | MEDICATED_PATCH | TRANSDERMAL | Status: DC

## 2017-09-16 MED ORDER — LIDOCAINE HCL 1 % IJ SOLN
INTRAMUSCULAR | Status: AC | PRN
  Administered 2017-09-16: 5 mL

## 2017-09-16 MED ORDER — FENTANYL 75 MCG/HR TD PT72
75.0000 ug | MEDICATED_PATCH | TRANSDERMAL | Status: DC
  Administered 2017-09-16 – 2017-09-25 (×4): 75 ug via TRANSDERMAL
  Filled 2017-09-16 (×4): qty 1

## 2017-09-16 MED ORDER — MIDAZOLAM HCL 2 MG/2ML IJ SOLN
INTRAMUSCULAR | Status: AC
  Filled 2017-09-16: qty 6

## 2017-09-16 MED ORDER — FENTANYL CITRATE (PF) 100 MCG/2ML IJ SOLN
INTRAMUSCULAR | Status: AC
  Filled 2017-09-16: qty 6

## 2017-09-16 MED ORDER — HYDROMORPHONE HCL 1 MG/ML IJ SOLN
1.0000 mg | INTRAMUSCULAR | Status: DC | PRN
  Administered 2017-09-16 – 2017-09-18 (×15): 1 mg via INTRAVENOUS
  Filled 2017-09-16 (×16): qty 1

## 2017-09-16 MED ORDER — MAGNESIUM SULFATE 2 GM/50ML IV SOLN
2.0000 g | Freq: Once | INTRAVENOUS | Status: AC
Start: 2017-09-16 — End: 2017-09-16
  Administered 2017-09-16: 2 g via INTRAVENOUS
  Filled 2017-09-16: qty 50

## 2017-09-16 MED ORDER — FENTANYL CITRATE (PF) 100 MCG/2ML IJ SOLN
INTRAMUSCULAR | Status: AC | PRN
  Administered 2017-09-16 (×3): 25 ug via INTRAVENOUS
  Administered 2017-09-16 (×2): 50 ug via INTRAVENOUS
  Administered 2017-09-16: 25 ug via INTRAVENOUS

## 2017-09-16 NOTE — Progress Notes (Signed)
OT Cancellation Note  Patient Details Name: Tyniya Kuyper MRN: [id number] DOB: 04-14-1956   Cancelled Treatment:    Reason Eval/Treat Not Completed: Patient at procedure or test/ unavailable  [place], Thereasa Parkin 09/16/2017, 12:14 PM

## 2017-09-16 NOTE — Progress Notes (Addendum)
PROGRESS NOTE  [name]  CWC:[id number] DOB: [birth year] DOA: 08/29/2017   PCP: Ann Held, DO   Brief Narrative:  62 year old with a history of hyperlipidemia, diabetes mellitus type 2, hypothyroidism, idiopathic neutropenia, breast cancer status post bilateral mastectomy, ovarian and endometrial cancer status post colectomy in 2016 and takedown in August 2018 with ileocecal resection" and anal anastomosis and diverting loop ileostomy along with lysis of adhesion, urethral stent exchange with psoas bladder hitch.  CT of her abdomen pelvis showed new colovesicular fistula therefore she was admitted and was also noted to have subacute renal failure.  Nephrology has been following.  Medicine team consulted for diabetes management.  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 - Per surgery team, possible low pelvic fluid collection likely between:, May be reasonable to try placing drain there to help control drainage and minimize leaking - Will follow-up on surgery and radiology teams recommendations - Continue to provide analgesia prn   DM type II with complications of nephropathy - continue Insulin Glargine 25 U BID - Continue insulin NovoLog with meal coverage 7 units, nighttime coverage insulin - continue oral linagliptin   - No changes in insulin regimen for now  Depression - Stable this morning - continue Zoloft and Lyrica - continue Trazodone QHS  Hyponatremia - Resolved  Hypothyroidism - continue synthroid   Hypomagnesemia and hypokalemia - Magnesium still low, continue to supplement and repeat in the morning  AKI on CKD stage III - at baseline Cr 2.32 - Slight bump in creatinine this morning, will repeat BMP in AM  Breast, ovarian and endometrial cancer - continue anastrozole 1 mg  Idiopathic neutropenia, anemia - Continue weekly Granix - Drop in hemoglobin noted, if < 7, plan to transfuse PRBC - Platelets and white blood  cell count stable   Hyperlipidemia - continue statin   Increased nutrient needs - low albumin  - continue nutritional supplements   Morbid obesity - meets criteria with BMI > 35 and underlying HLD, DM, CKD - Body mass index is 38.15 kg/m.  Gluteal skin breakdown  - continue barrier cream  Consultants:   Surgery  Urology  PCT  IR  DVT prophylaxis - Lovenox SQ  Procedures and studies: 08/17/2017 1.Cystoscopy with right retrograde pyelogram. 2. Removal of right ureteral stent.  09/02/2017  1.Bilateral nephrostomy tube placement  09/13/2017 Right nephrostogram 1.Right nephrectomy tube intact, no evidence of leak, distal cath well positioned in tight renal pelvis   08/31/2017 CT abdomen pelvis without contrast 1. Rectovesical fistula 2. Probable blind-ending fistulous tract extending into the presacral space from the rectum adjacent to the orifice of the rectovesical fistula 3. Reflux of contrast material into the right kidney via the right ureter with moderate atrophy of the right kidney. Nonobstructive calculi in the lower pole collecting system of the left kidney measuring up to 11 mm.  08/30/2017 renal ultrasound: 1. Small nonobstructive calculi in the collecting systems of both kidneys. 2. Mild to moderate hydronephrosis.  Subjective: Patient reports persistent pain but overall better, no nausea or vomiting, still with some transanal drainage, reports Foley output has increased   Objective: Vitals:   09/15/17 0554 09/15/17 1457 09/15/17 2126 09/16/17 0648  BP: (!) 117/58 (!) 144/62 124/83 (!) 131/50  Pulse: 83 83 95 87  Resp: 18 16 15 16   Temp: 97.8 F (36.6 C) 98.5 F (36.9 C) 100 F (37.8 C) 98.5 F (36.9 C)  TempSrc: Oral Oral Oral Oral  SpO2: 99% 99%  100% 99%  Weight: 91.2 kg (201 lb 1 oz)     Height:        Intake/Output Summary (Last 24 hours) at 09/16/2017 1009 Last data filed at 09/16/2017 8366 Gross per 24 hour  Intake 850 ml  Output  2505 ml  Net -1655 ml   Filed Weights   09/13/17 0509 09/14/17 0428 09/15/17 0554  Weight: 88 kg (194 lb) 94.6 kg (208 lb 8.9 oz) 91.2 kg (201 lb 1 oz)   Physical Exam  Constitutional: Appears well-developed and well-nourished. No distress.  CVS: RRR, S1/S2 +, no murmurs, no gallops, no carotid bruit.  Pulmonary: Effort and breath sounds normal, no stridor, rhonchi, wheezes, rales.  Abdominal: Soft. BS +,  no distension, nontender, ostomy bag in place  Musculoskeletal: Normal range of motion. No edema and no tenderness.  Neuro: Alert. Normal reflexes, muscle tone coordination. No cranial nerve deficit. Psychiatric: Normal mood and affect. Behavior, judgment, thought content normal.   Data Reviewed:   CBC: Recent Labs  Lab 09/13/17 0335 09/15/17 0345 09/16/17 0401  WBC 4.3 3.6* 4.2  NEUTROABS 2.4  --   --   HGB 8.0* 7.5* 7.0*  HCT 25.5* 24.1* 22.7*  MCV 95.1 94.9 95.4  PLT 436* 377 294   Basic Metabolic Panel: Recent Labs  Lab 09/12/17 0329 09/13/17 0335 09/14/17 0410 09/15/17 0345 09/16/17 0401  NA  --   --   --  134* 136  K 4.0 3.7 3.6 4.1 4.2  CL  --   --   --  106 108  CO2  --   --   --  20* 19*  GLUCOSE  --   --   --  140* 172*  BUN  --   --   --  38* 40*  CREATININE 2.47* 2.32* 2.30* 2.03* 2.13*  CALCIUM  --   --   --  9.0 8.9  MG 1.1* 2.0 1.4* 2.1 1.6*  PHOS 4.5  --   --  4.0 4.3   Liver Function Tests: Recent Labs  Lab 09/15/17 0345  AST 15  ALT 21  ALKPHOS 131*  BILITOT 0.3  PROT 6.5  ALBUMIN 1.9*   CBG: Recent Labs  Lab 09/15/17 0744 09/15/17 1155 09/15/17 1713 09/15/17 2117 09/16/17 0733  GLUCAP 112* 257* 152* 165* 149*    Scheduled Meds: . acetaminophen  1,000 mg Oral TID WC & HS  . anastrozole  1 mg Oral Daily  . artificial tears   Both Eyes QHS  . cholecalciferol  10,000 Units Oral Weekly  . enoxaparin (LOVENOX) injection  30 mg Subcutaneous Q24H  . famotidine  20 mg Oral Daily  . fentaNYL  50 mcg Transdermal Q72H  .  fentaNYL      . ferrous sulfate  325 mg Oral QHS  . Gerhardt's butt cream   Topical QID  . insulin aspart  0-20 Units Subcutaneous TID WC  . insulin aspart  0-5 Units Subcutaneous QHS  . insulin aspart  7 Units Subcutaneous TID WC  . insulin glargine  25 Units Subcutaneous BID  . levothyroxine  150 mcg Oral QAC breakfast  . lidocaine  1 patch Transdermal Q24H  . linagliptin  5 mg Oral Daily  . lip balm  1 application Topical BID  . midazolam      . polyvinyl alcohol  1 drop Both Eyes QHS  . pregabalin  100 mg Oral Daily  . prenatal multivitamin  1 tablet Oral Daily  . protein supplement  shake  11 oz Oral BID BM  . psyllium  1 packet Oral BID  . rosuvastatin  10 mg Oral QPM  . sertraline  50 mg Oral Daily  . Tbo-Filgrastim  480 mcg Subcutaneous Weekly   Continuous Infusions:   LOS: 18 days   Time spent: 25 mins   25 minutes with > 50% of time discussing current diagnostic test results, clinical impression and plan of care.  Faye Ramsay, MD [facility] [phone]  If 7PM-7AM, please contact night-coverage [email or web address] Password [email or web address] 09/16/2017, 10:09 AM

## 2017-09-16 NOTE — Progress Notes (Signed)
Referring Physician(s): Manny,T  Supervising Physician: Corrie Mckusick  Patient Status:  [facility] - In-[facility]  Chief Complaint:  Bladder leak  Subjective: Pt feeling about the same this am; still with rt flank/hip soreness, sporadic leaking of fluid from rectum   Allergies: Penicillins; Ultram [tramadol]; Adhesive [tape]; Cefaclor; Erythromycin; Trimethoprim; Ciprofloxacin; Fluconazole; Oxycodone; Pectin; and Sulfa antibiotics  Medications: Prior to Admission medications   Medication Sig Start Date End Date Taking? Authorizing Provider  anastrozole (ARIMIDEX) 1 MG tablet TAKE 1 TABLET DAILY 12/08/16  Yes Gorsuch, Ni, MD  Biotin 5 MG TABS Take 5 mg by mouth every morning.    Yes [provider]  Cholecalciferol (VITAMIN D3) 10000 UNITS capsule Take 10,000 Units by mouth once a week. Sunday evening's   Yes [provider]  diphenoxylate-atropine (LOMOTIL) 2.5-0.025 MG tablet Take 1-2 tablets by mouth every 8 (eight) hours. TO PREVENT LOOSE BOWEL MOVEMENTS 05/20/17  Yes [provider]  Famotidine-Ca Carb-Mag Hydrox (PEPCID COMPLETE PO) Take 1 tablet by mouth daily as needed (INDIGESTION).   Yes [provider]  filgrastim (NEUPOGEN) 480 MCG/1.6ML injection Inject 1.6 ml under the skin every 5 days for life Patient taking differently: Inject 480 mcg into the skin See admin instructions. Inject 1.6 ml under the skin every 6 days for life 01/06/17  Yes Gorsuch, Ni, MD  levothyroxine (SYNTHROID, LEVOTHROID) 150 MCG tablet Take 150 mcg by mouth daily before breakfast.    Yes [provider]  loratadine (CLARITIN) 10 MG tablet Take 10 mg by mouth every morning.    Yes [provider]  omega-3 acid ethyl esters (LOVAZA) 1 G capsule Take 1 g by mouth 2 (two) times daily.    Yes [provider]  ondansetron (ZOFRAN-ODT) 4 MG disintegrating tablet Take 4 mg by mouth every 6 (six) hours as needed for nausea or vomiting.  04/12/17  Yes [provider]  Polyethyl Glycol-Propyl Glycol (SYSTANE OP) Place 1 drop into both eyes at bedtime.    Yes [provider]  Prenatal Vit-Fe Fumarate-FA (PRENATAL VITAMIN PO) Take 1 capsule by mouth daily. Takes prenatal because there are no dyes in it   Yes [provider]  prochlorperazine (COMPAZINE) 10 MG tablet Take 1 tablet (10 mg total) by mouth 2 (two) times daily as needed for nausea or vomiting. Patient taking differently: Take 10 mg by mouth 2 (two) times daily as needed for nausea or vomiting.  05/28/17  Yes Virgel Manifold, MD  promethazine (PHENERGAN) 25 MG tablet Take 1 tablet (25 mg total) by mouth every 6 (six) hours as needed for nausea or vomiting. 05/28/17  Yes Virgel Manifold, MD  ranitidine (ZANTAC) 150 MG tablet Take 150 mg by mouth 2 (two) times daily as needed for heartburn.    Yes [provider]  rosuvastatin (CRESTOR) 10 MG tablet Take 10 mg by mouth every evening.    Yes [provider]  sitaGLIPtin (JANUVIA) 100 MG tablet Take 100 mg by mouth every morning.    Yes [provider]  HYDROcodone-acetaminophen (NORCO/VICODIN) 5-325 MG tablet Take 1 tablet by mouth every 6 (six) hours as needed (Pain). Patient not taking: Reported on 08/29/2017 08/10/17   Shelda Pal, DO     Vital Signs: BP (!) 131/50 (BP Location: Right Leg)   Pulse 87   Temp 98.5 F (36.9 C) (Oral)   Resp 16   Ht 5' 2"  (1.575 m)   Wt 201 lb 1 oz (91.2 kg)   SpO2  99%   BMI 36.77 kg/m   Physical Exam awake/alert; bilat PCN's intact, outputs L-230cc R-400cc yesterday; chest- CTA bilat; heart- RRR; abd- soft, ostomy intact,+BS,NT  Imaging: Ct Abdomen Pelvis Wo Contrast  Result Date: 09/15/2017 CLINICAL DATA:  History of ovarian and uterine cancer complicated by development of a rectovesical fistula requiring placement of bilateral percutaneous nephrostomy catheters on 09/02/2017 for the purposes of urinary diversion. Unfortunately, the patient  has experienced persistent right-sided flank pain attributable to the right-sided nephrostomy catheter. EXAM: CT ABDOMEN AND PELVIS WITHOUT CONTRAST TECHNIQUE: Multidetector CT imaging of the abdomen and pelvis was performed following the standard protocol without IV contrast. COMPARISON:  CT abdomen pelvis-08/31/2017; 02/09/2016; ultrasound fluoroscopic guided bilateral percutaneous nephrostomy catheter placement-09/02/2016; right-sided antegrade nephrostogram - 09/13/2017 FINDINGS: Lack of intravenous contrast limits the ability to evaluate solid abdominal organs. Lower chest: Limited visualization of the lower thorax demonstrates minimal subsegmental atelectasis within the imaged bilateral lower lobes, right greater than left. No discrete focal airspace opacities. No pleural effusion. Normal heart size.  No pericardial effusion. Hepatobiliary: Normal hepatic contour. Post cholecystectomy. Persistent dilatation of the common bile duct, measuring approximately 1.3 cm (as measured in greatest oblique short axis coronal diameter - image 51, series 5), presumably secondary to biliary reservoir phenomenon. No radiopaque gallstones. No ascites. Pancreas: Normal noncontrast appearance of the pancreas Spleen: Normal noncontrast appearance of the spleen Adrenals/Urinary Tract: Bilateral percutaneous nephrostomy catheters are probe with the position with very mild residual left-sided pelvicaliectasis. No significant right-sided pelvicaliectasis. Both percutaneous nephrostomy catheters take a slightly serpiginous tract however this is likely secondary to patient body habitus. No definitive associated pericatheter hematoma. There is no definitive involvement of either hemidiaphragm at both nephrostomies are below the 12th rib. Mid minimal amount of bilateral perinephric stranding, unchanged. Previously characterized approximately 0.7 cm adenoma within left adrenal gland is unchanged (image 22, series 2). Normal noncontrast  appearance of the right adrenal gland. Urinary bladder is decompressed with Foley catheter. Stomach/Bowel: Redemonstrated loop ileostomy within the right mid hemiabdomen and the sequela of near complete colectomy and small bowel resection. There is a grossly unchanged approximately 4.7 x 3.7 cm fluid collection within the presacral space (image 71, series 2) at the location of previously noted contained perforation involving a suspected loop of small bowel extends down to the midline of the lower pelvis. Mild distention of the stomach. No evidence of enteric obstruction. No pneumoperitoneum, pneumatosis or portal venous gas. Vascular/Lymphatic: Normal caliber the abdominal aorta. No bulky retroperitoneal, mesenteric, pelvic or inguinal lymphadenopathy on this noncontrast examination. Reproductive: Post hysterectomy.  No discrete adnexal lesion. Other: Diffuse body wall anasarca. Stranding within the left upper abdominal quadrant at location of prior end colostomy. Post bilateral breast augmentation. Musculoskeletal: No acute or aggressive osseous abnormalities. No definitive evidence of osteolysis though the presacral fluid collection does abut the ventral aspect of the sacrum and coccyx. IMPRESSION: 1. Appropriately positioned bilateral percutaneous nephrostomy catheters without evidence complication. 2. Grossly unchanged approximately 4.7 cm fluid collection within the presacral space at the location of previously noted contained perforation involving a suspected loop of small bowel which extends down to the midline of the lower pelvis. While there is no evidence of osteolysis to suggest osteomyelitis, the presacral fluid collection is noted to abut the ventral aspect of the sacrum coccyx. Further evaluation with pelvic MRI could be performed as indicated. 3. Stable sequela of loop ileostomy and small bowel anastomosis without evidence of enteric obstruction. Electronically Signed   By: Eldridge Abrahams.D.  On:  09/15/2017 17:50   Ir Nephrostogram Right Thru Existing Access  Result Date: 09/13/2017 INDICATION: History of bilateral percutaneous nephrostomy tube placement on 09/02/2017 to divert urine due to fistula between the rectum and bladder. After recent fall, the patient is complaining of significant pain in the right flank region near the site of the indwelling right nephrostomy tube. EXAM: RIGHT NEPHROSTOGRAM COMPARISON:  Imaging at the time of nephrostomy tube placement on 09/02/2017 MEDICATIONS: None ANESTHESIA/SEDATION: None CONTRAST:  5 mL Isovue-300-administered into the collecting system(s) FLUOROSCOPY TIME:  Fluoroscopy Time: 12 seconds.  3.8 mGy. COMPLICATIONS: None immediate. PROCEDURE: Under fluoroscopy, contrast material was injected via the pre-existing right percutaneous nephrostomy tube. Fluoroscopic images were saved as a cine loop. The catheter was flushed and reattached to a gravity drainage bag. FINDINGS: The percutaneous nephrostomy tube is intact without evidence of catheter rupture or contrast extravasation. The catheter is formed at the level of the renal pelvis and is well positioned. No significant filling defects are identified in the renal collecting system or opacified ureter. IMPRESSION: Unremarkable right nephrostogram via pre-existing percutaneous nephrostomy tube demonstrating intact nephrostomy tube which is positioned in the right renal pelvis. Electronically Signed   By: Aletta Edouard M.D.   On: 09/13/2017 16:10    Labs:  CBC: Recent Labs    09/08/17 0355 09/13/17 0335 09/15/17 0345 09/16/17 0401  WBC 2.4* 4.3 3.6* 4.2  HGB 7.7* 8.0* 7.5* 7.0*  HCT 25.2* 25.5* 24.1* 22.7*  PLT 333 436* 377 342    COAGS: Recent Labs    04/16/17 0047 09/02/17 0939  INR 1.21 1.21    BMP: Recent Labs    09/08/17 0355 09/09/17 0457  09/13/17 0335 09/14/17 0410 09/15/17 0345 09/16/17 0401  NA 134* 131*  --   --   --  134* 136  K 3.8 3.9   < > 3.7 3.6 4.1 4.2  CL  107 105  --   --   --  106 108  CO2 20* 18*  --   --   --  20* 19*  GLUCOSE 150* 228*  --   --   --  140* 172*  BUN 51* 47*  --   --   --  38* 40*  CALCIUM 8.5* 8.4*  --   --   --  9.0 8.9  CREATININE 2.88* 2.65*   < > 2.32* 2.30* 2.03* 2.13*  GFRNONAA 16* 18*   < > 21* 21* 25* 24*  GFRAA 19* 21*   < > 25* 25* 29* 27*   < > = values in this interval not displayed.    LIVER FUNCTION TESTS: Recent Labs    04/21/17 0416 05/27/17 1724 08/29/17 1550  09/07/17 0324 09/08/17 0355 09/09/17 0457 09/15/17 0345  BILITOT 0.5 0.3 0.5  --   --   --   --  0.3  AST 13* 24 16  --   --   --   --  15  ALT 10* 33 24  --   --   --   --  21  ALKPHOS 190* 166* 173*  --   --   --   --  131*  PROT 5.9* 9.2* 6.8  --   --   --   --  6.5  ALBUMIN 1.8* 3.9 2.1*   < > 1.9* 1.8* 1.8* 1.9*   < > = values in this interval not displayed.    Assessment and Plan: Ptwith hx met endom ca, post op bladder  injury/ leak (rectal bladder fistula), s/p diverting bilat PCN's 2/1;afebrile; WBC 4.2, hgb 7.0(7.5)- ?transfuse; creat 2.13(2.03); f/u CT yesterday reveals appropriate postioning of PCN's with no evident complication; unchanged 4.7 cm presacral fluid collection; request received from [facility] for image guided drainage of this presacral collection; images were reviewed by Dr. Earleen Newport. Risks and benefits discussed with the patient including bleeding, infection, damage to adjacent structures, bowel perforation/fistula connection, and sepsis.  All of the patient's questions were answered, patient is agreeable to proceed. Consent signed and in chart.  Procedure scheduled for today     Electronically Signed: D. Rowe Robert, PA-C 09/16/2017, 10:01 AM   I spent a total of 20 minutes at the the patient's bedside AND on the patient's hospital floor or unit, greater than 50% of which was counseling/coordinating care for bilateral nephrostomies/presacral fluid collection drainage    Patient ID: [name], female    DOB: 1956-01-20, 62 y.o.   MRN: [id number]

## 2017-09-16 NOTE — Procedures (Signed)
Interventional Radiology Procedure Note  Procedure: CT guided placement of presacral drain.  3F drain to gravity.  Complications: None Recommendations:  - Record output.  TID sterile saline flushes.  - Do not submerge   - Routine care   Signed,  Dulcy Fanny. Earleen Newport, DO

## 2017-09-16 NOTE — Progress Notes (Signed)
Dr Doyle Askew paged to inquire about patient's Lantus insulin. Current blood sugar is 97. [name], RN

## 2017-09-16 NOTE — Progress Notes (Signed)
[address] [place], [place] [address] Phone: [phone]  FAX: [phone]      [name] [id number] Sep 06, 1955  CARE TEAM:  PCP: Ann Held, DO  Outpatient Care Team: Patient Care Team: Carollee Herter, Alferd Apa, DO as PCP - General (Family Medicine) Heath Lark, MD as Consulting Physician (Hematology and Oncology) Everitt Amber, MD as Consulting Physician (Obstetrics and Gynecology) Michael [name], MD as Consulting Physician (General Surgery) Alexis Frock, MD as Consulting Physician (Urology) Altheimer, Legrand Como, MD as Consulting Physician (Endocrinology) Katy Apo, MD as Consulting Physician (Ophthalmology) Irene Limbo, MD as Consulting Physician (Plastic Surgery) Madelon Lips, MD as Consulting Physician (Nephrology)  Inpatient Treatment Team: Treatment Team: Attending Provider: Theodis Blaze, MD; Consulting Physician: Alexis Frock, MD; Registered Nurse: Illene Regulus, RN; Technician: Reuel Derby, NT; Consulting Physician: Michael [name], MD; Technician: Leda Quail, NT; Technician: Estrellita Ludwig, [place]; Rounding Team: Dorthy Cooler Radiology, MD; Technician: Sueanne Margarita, [place]; Consulting Physician: Faythe Dingwall, DO; Technician: Etheleen Sia, [place]; Attending Physician: Theodis Blaze, MD; Rounding Team: Fatima Blank, MD; Occupational Therapist: [name], OT; Technician: Rosalia Hammers, NT; Registered Nurse: Vicente Serene, RN   Problem List:   Principal Problem:   MDD (major depressive disorder), single episode, moderate (HCC) Active Problems:   Chronic kidney disease (CKD), stage III (moderate) ([place])   Colovesical fistula - delayed   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 s/p resection/partial vaginectomy/ LAR/colostomy 11/19/2014   Ureteral stricture, right, s/p resection/reimplantation into bladder (Heineke-Mikulicz  with Psoas Hitch) 04/01/2017   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   Protein-calorie malnutrition, moderate ([place])   Primary hypothyroidism   DM type 2 (diabetes mellitus, type 2) ([place])   History of ovarian & endometrial cancer   Morbid obesity ([place])   Pelvic cancer s/p colostomy takedown/loop ileostomy diversion 04/01/2017   Dehydration   ARF (acute renal failure) (HCC)   Adjustment disorder with mixed anxiety and depressed mood   Poorly controlled diabetes mellitus ([place])   Pressure injury of skin   History of external beam radiation therapy to pelvis  SURGERY 04/01/2017     POST-OPERATIVE DIAGNOSIS:   Recurrent endometrial cancer status post low anterior resection and partial pelvic exeneration  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   SURGEON:Catalia Massett Gwynneth Aliment, MD- Primary (Dr Tresa Moore assist) PROCEDURE:  XI ROBOTIC LYSIS OF ADHESIONS x 5 hours XI ROBOTIC COLOSTOMY TAKEDOWN ROBOTIC SEWN COLOANAL ANASTOMOSIS Ileocecal resection with anastomosis Diverting loop ileostomy  SURGEON: Alexis Frock, MD - Primary (Dr Johney Maine assist) PROCEDURE:  Cystoscopy with R retrograde and stent exchange.  Robotic R ureteral reimplant with psoas bladder hitch.  Consults: urology   SURGERY 08/17/2017  DIAGNOSIS:  Right ureteral stricture, status post complex reconstruction.  PROCEDURES: 1. Cystoscopy with right retrograde pyelogram. 2. Removal of right ureteral stent.  PHYSICIAN:  Alexis Frock, MD        SPECIMENS:  Right ureteral stone for discard, inspected and intact.  FINDINGS: 1. Fusiform bladder shape consistent with prior psoas hitch. 2. Visibly patent ureteral-bladder anastomosis. 3. No evidence of hydronephrosis or narrowing with right retrograde     pyelogram.    Assessment  Struggling with pain control  Delayed bladder leak turned into colovesical fistula with preferential urinary drainage in decompressed diverted colon limb - diverted urine w  bilateral nephrostomy tubes & foley.  .  Plan:   Possible low pelvic fluid collection most likely between colon that is  functioning as a neorectum at a very low coloanal anastomosis.  May be reasonable to try and stick and a drain there to help control drainage away from the anus and minimize leaking.  Help it resolve.  I suspect she will not tolerate another transgluteal drain, but might be needed.  I am highly skeptical that is truly small bowel there.   Dr. Daryll Brod available to review films.  He thinks it is reasonable and there is a window to try and transgluteal drain the low presacral fluid collection.  He will discuss with the interventional radiologist at [facility].  Orders have been placed.  Gradually improving pain control.  Palliative care following closely.  Fentanyl patch for now given her renal insufficiency.  Still needing IV pain meds.  Lidocaine patch may be helping pain on right back nephrostomy tube site.    Oral Dilaudid dose helps at 4 mg only.  Inc Scheduled Tylenol to QID.  Lyrica for chronic pain / depression - increased from 75 to 130m. ?Switch BID - defer to Palliative care.  Robaxin PRN.     Pain at R back nephrostomy tube site - Xrays w/o fracture.  Nephrostomy tube injection showing no leak or abnormality.  CT scan did not show any obvious malpositioning of the tubes.  Urology and interventional radiology feel like it is in good position but may do new drain to mitigate possiblitiy of pinched intercostal nerve.  Change dressing more often.    Gluteal skin breakdown - stabilized & improved.  Continue barrier cream.  rinary diversion as much as possible - still not 100% with anal leakage.  Perhaps a trans-gluteal percutaneous drain at presacral fluid collection could help divert this away and control it better and allow the region to dry up.  Powders PRN.  GET HER UP to avoid deeper decubitus formation  Colovesical fistula due to bladder breakdown & delayed  leak.  Urinary diversion s/p percutaneous nephrostomy tubes to divert urine directly.  Foley to catch any more urine.  Still leaking urine output anus.  Somewhat improved but fair.  Suspect Ileal conduit imminent   Follow off antibiotics  Mag normalizing.  K & Phos OK.  Follow  Zoloft for depression.  Psychiatric evaluation done - added trazadone QHS & rec PCP f/u.  Psych f/u PRN  Diabetes better controlled.  D/w TRH.  Continue lantus Bid & PO.  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  Iron deficiency anemia with borderline hemoglobin.  Given another dose of IV dextran this admission.  Oral iron - QHS.  She had difficulty absorbing and tolerating.  With urine leak better controlled, perhaps she can tolerate better now  Solid diet.  Bowel regimen.  Output under control w/o massive antidiarrheals.    Ostomy care - not major issues w ileostomy  Continue neupogen injections for pancytopenia  VTE prophylaxis: SCDs, etc  Ambulate as tolerated to help recovery    Leak on the left lateral inferiolateral corner of the cystostomy Heineke-Mikulicz and psoas hitch needed for the bladder to reach the right ureter at that had the chronic severe distal stricture.  I am highly skeptical that a radiated bladder with a delayed leak will spontaneously heal.  She had internal Foley drainage, external surgical drainage, internal stenting drainage for a few months, yet this happened. Therefore, most likely will require surgical repair.  Standard of care usually on this situation is to resect colon & do a new distal anastomosis. Primarily repair  the bladder & patch it.  However, there is no way I can redo anastomosis in a radiated pelvis with prior surgeries with prior anterior exoneration, radiation, 7-hour lysis adhesions and prior surgery when she was in better shape.  Only options I can see is to try & primarily patch the colon with OMENTOPEXY, primarily repair the bladder with OMENTOPEXY,  and hope it will heal.  I am skeptical that it will.  Most likely we will have to convert to an end colostomy to get the colon away from it and allow the bladder to be patched vs cystectomy & ileal conduit.  I do agree with Dr. Tresa Moore that she is rather malnourished and deconditioned at this point so would like to try and hold off on definitive repair if we can.  We will discuss that later once she is in better shape to actually tolerate a reoperation should it come to that (very likely).  Home health nursing seems to have failed.  Lack of outpatient ostomy clinic in town here making outpatient management challenging.   I strongly suspect she will need a SNF.  Patient more motivated to get up and move around.  She is still embarrassed in frustrated with persistent urine leak out anus.  But she is getting up some.  Work to try and transition to outpatient rehab with SNF soon.  Needs better pain control     40 minutes spent in review, evaluation, examination, counseling, and coordination of care.  More than 50% of that time was spent in counseling.  [name], M.D., F.A.C.S. Gastrointestinal and Minimally Invasive Surgery [facility] [address] [place], [place] [address] [phone] Main / Paging    09/16/2017    Subjective: (Chief complaint)  Had worsening pain and discomfort.  CT scan ordered.  Concern for pelvic fluid collection perhaps larger.  Patient still has some transanal drainage.  She does note that Foley output has increased as opposed to the nephrostomy tubes.  Pain better controlled this morning overall.  No nausea or vomiting.  In better spirits and more optimistic today.  Objective:  Vital signs:  Vitals:   09/15/17 0554 09/15/17 1457 09/15/17 2126 09/16/17 0648  BP: (!) 117/58 (!) 144/62 124/83 (!) 131/50  Pulse: 83 83 95 87  Resp: _0 Temp: 97.8 F (36.6 C) 98.5 F (36.9 C) 100 F (37.8 C) 98.5 F (36.9  C)  TempSrc: Oral Oral Oral Oral  SpO2: 99% 99% 100% 99%  Weight: 91.2 kg (201 lb 1 oz)     Height:        Last BM Date: 09/15/17  Intake/Output   Yesterday:  02/14 0701 - 02/15 0700 In: 850 [P.O.:840; I.V.:10] Out: 2105 [Urine:1355; Stool:750] This shift:  Total I/O In: 370 [P.O.:360; I.V.:10] Out: 1385 [Urine:835; Stool:550]  Bowel function:  Flatus: YES   BM:  YES.  Thick oatmeal consistency succus in ileostomy bag.  Drain: No drains.     Physical Exam:  General: Pt awake/alert/oriented x4 in mild acute distress. Eyes: PERRL, normal EOM.  Sclera clear.  No icterus Neuro: CN II-XII intact w/o focal sensory/motor deficits. Lymph: No head/neck/groin lymphadenopathy Psych:  No delerium/psychosis/paranoia.  Crying & tired.  Somewhat consolable HENT: Normocephalic, Mucus membranes moist.  No thrush Neck: Supple, No tracheal deviation Chest: No chest wall pain w good excursion CV:  Pulses intact.  Regular rhythm MS: Normal AROM mjr joints.  No obvious deformity.  Bilateral perc  nephrostomy bags - R posterior sharp pain at skin site but no cellultitis  Abdomen: Soft.  Nondistended.  No tenderness to palpation.  No evidence of peritonitis.  No incarcerated hernias.  No wounds.  Ileostomy pink with gas and succus in bag.      GU.  No purulent drainage or hematoma.  Foley in place with clear light yellow urine  Rectal.  Gluteal skin breakdown superficial and stable with barrier cream.  Ext:   No deformity.  No mjr edema.  No cyanosis Skin: No petechiae / purpura  Results:   Labs: Results for orders placed or performed during the hospital encounter of 08/29/17 (from the past 48 hour(s))  Glucose, capillary     Status: Abnormal   Collection Time: 09/14/17  7:12 AM  Result Value Ref Range   Glucose-Capillary 103 (H) 65 - 99 mg/dL  Glucose, capillary     Status: Abnormal   Collection Time: 09/14/17 11:51 AM  Result Value Ref Range   Glucose-Capillary 224 (H) 65 - 99  mg/dL  Glucose, capillary     Status: None   Collection Time: 09/14/17  4:28 PM  Result Value Ref Range   Glucose-Capillary 95 65 - 99 mg/dL  Glucose, capillary     Status: Abnormal   Collection Time: 09/14/17  9:25 PM  Result Value Ref Range   Glucose-Capillary 161 (H) 65 - 99 mg/dL  Magnesium     Status: None   Collection Time: 09/15/17  3:45 AM  Result Value Ref Range   Magnesium 2.1 1.7 - 2.4 mg/dL    Comment: Performed at [facility], [place] [address], [place], [place] [address]  Comprehensive metabolic panel     Status: Abnormal   Collection Time: 09/15/17  3:45 AM  Result Value Ref Range   Sodium 134 (L) 135 - 145 mmol/L   Potassium 4.1 3.5 - 5.1 mmol/L   Chloride 106 101 - 111 mmol/L   CO2 20 (L) 22 - 32 mmol/L   Glucose, Bld 140 (H) 65 - 99 mg/dL   BUN 38 (H) 6 - 20 mg/dL   Creatinine, Ser 2.03 (H) 0.44 - 1.00 mg/dL   Calcium 9.0 8.9 - 10.3 mg/dL   Total Protein 6.5 6.5 - 8.1 g/dL   Albumin 1.9 (L) 3.5 - 5.0 g/dL   AST 15 15 - 41 U/L   ALT 21 14 - 54 U/L   Alkaline Phosphatase 131 (H) 38 - 126 U/L   Total Bilirubin 0.3 0.3 - 1.2 mg/dL   GFR calc non Af Amer 25 (L) >60 mL/min   GFR calc Af Amer 29 (L) >60 mL/min    Comment: (NOTE) The eGFR has been calculated using the CKD EPI equation. This calculation has not been validated in all clinical situations. eGFR's persistently <60 mL/min signify possible Chronic Kidney Disease.    Anion gap 8 5 - 15    Comment: Performed at [facility], [place] [address] [place], [place] [address]  CBC     Status: Abnormal   Collection Time: 09/15/17  3:45 AM  Result Value Ref Range   WBC 3.6 (L) 4.0 - 10.5 K/uL   RBC 2.54 (L) 3.87 - 5.11 MIL/uL   Hemoglobin 7.5 (L) 12.0 - 15.0 g/dL   HCT 24.1 (L) 36.0 - 46.0 %   MCV 94.9 78.0 - 100.0 fL   MCH 29.5 26.0 - 34.0 pg   MCHC 31.1 30.0 - 36.0 g/dL   RDW 16.3 (H)  11.5 - 15.5 %   Platelets 377 150 - 400 K/uL    Comment: Performed at [facility], [place] [address] [place], [place] [address]  Phosphorus     Status: None   Collection Time: 09/15/17  3:45 AM  Result Value Ref Range   Phosphorus 4.0 2.5 - 4.6 mg/dL    Comment: Performed at [facility], [place] [address] [place], [place] [address]  Glucose, capillary     Status: Abnormal   Collection Time: 09/15/17  7:44 AM  Result Value Ref Range   Glucose-Capillary 112 (H) 65 - 99 mg/dL  Glucose, capillary     Status: Abnormal   Collection Time: 09/15/17 11:55 AM  Result Value Ref Range   Glucose-Capillary 257 (H) 65 - 99 mg/dL  Glucose, capillary     Status: Abnormal   Collection Time: 09/15/17  5:13 PM  Result Value Ref Range   Glucose-Capillary 152 (H) 65 - 99 mg/dL  Glucose, capillary     Status: Abnormal   Collection Time: 09/15/17  9:17 PM  Result Value Ref Range   Glucose-Capillary 165 (H) 65 - 99 mg/dL  Magnesium     Status: Abnormal   Collection Time: 09/16/17  4:01 AM  Result Value Ref Range   Magnesium 1.6 (L) 1.7 - 2.4 mg/dL    Comment: Performed at [facility], [place] [address] [place], [place] [address]  CBC     Status: Abnormal   Collection Time: 09/16/17  4:01 AM  Result Value Ref Range   WBC 4.2 4.0 - 10.5 K/uL   RBC 2.38 (L) 3.87 - 5.11 MIL/uL   Hemoglobin 7.0 (L) 12.0 - 15.0 g/dL   HCT 22.7 (L) 36.0 - 46.0 %   MCV 95.4 78.0 - 100.0 fL   MCH 29.4 26.0 - 34.0 pg   MCHC 30.8 30.0 - 36.0 g/dL   RDW 16.5 (H) 11.5 - 15.5 %   Platelets 342 150 - 400 K/uL    Comment: Performed at [facility], [place] [address] [place], [place] [address]  Basic metabolic panel     Status: Abnormal   Collection Time: 09/16/17  4:01 AM  Result Value Ref Range   Sodium 136 135 - 145 mmol/L   Potassium 4.2 3.5 - 5.1 mmol/L   Chloride 108 101 - 111 mmol/L   CO2 19 (L) 22 - 32 mmol/L   Glucose, Bld 172 (H) 65 - 99 mg/dL   BUN 40 (H) 6 - 20 mg/dL   Creatinine, Ser 2.13 (H) 0.44 - 1.00 mg/dL   Calcium  8.9 8.9 - 10.3 mg/dL   GFR calc non Af Amer 24 (L) >60 mL/min   GFR calc Af Amer 27 (L) >60 mL/min    Comment: (NOTE) The eGFR has been calculated using the CKD EPI equation. This calculation has not been validated in all clinical situations. eGFR's persistently <60 mL/min signify possible Chronic Kidney Disease.    Anion gap 9 5 - 15    Comment: Performed at [facility], [place] [address] [place], [place] [address]  Phosphorus     Status: None   Collection Time: 09/16/17  4:01 AM  Result Value Ref Range   Phosphorus 4.3 2.5 - 4.6 mg/dL    Comment: Performed at [name], [place] [address], [place], [place] [address]    Imaging / Studies: Ct Abdomen Pelvis Wo Contrast  Result Date: 09/15/2017 CLINICAL DATA:  History  of ovarian and uterine cancer complicated by development of a rectovesical fistula requiring placement of bilateral percutaneous nephrostomy catheters on 09/02/2017 for the purposes of urinary diversion. Unfortunately, the patient has experienced persistent right-sided flank pain attributable to the right-sided nephrostomy catheter. EXAM: CT ABDOMEN AND PELVIS WITHOUT CONTRAST TECHNIQUE: Multidetector CT imaging of the abdomen and pelvis was performed following the standard protocol without IV contrast. COMPARISON:  CT abdomen pelvis-08/31/2017; 02/09/2016; ultrasound fluoroscopic guided bilateral percutaneous nephrostomy catheter placement-09/02/2016; right-sided antegrade nephrostogram - 09/13/2017 FINDINGS: Lack of intravenous contrast limits the ability to evaluate solid abdominal organs. Lower chest: Limited visualization of the lower thorax demonstrates minimal subsegmental atelectasis within the imaged bilateral lower lobes, right greater than left. No discrete focal airspace opacities. No pleural effusion. Normal heart size.  No pericardial effusion. Hepatobiliary: Normal hepatic contour. Post cholecystectomy. Persistent dilatation of the  common bile duct, measuring approximately 1.3 cm (as measured in greatest oblique short axis coronal diameter - image 51, series 5), presumably secondary to biliary reservoir phenomenon. No radiopaque gallstones. No ascites. Pancreas: Normal noncontrast appearance of the pancreas Spleen: Normal noncontrast appearance of the spleen Adrenals/Urinary Tract: Bilateral percutaneous nephrostomy catheters are probe with the position with very mild residual left-sided pelvicaliectasis. No significant right-sided pelvicaliectasis. Both percutaneous nephrostomy catheters take a slightly serpiginous tract however this is likely secondary to patient body habitus. No definitive associated pericatheter hematoma. There is no definitive involvement of either hemidiaphragm at both nephrostomies are below the 12th rib. Mid minimal amount of bilateral perinephric stranding, unchanged. Previously characterized approximately 0.7 cm adenoma within left adrenal gland is unchanged (image 22, series 2). Normal noncontrast appearance of the right adrenal gland. Urinary bladder is decompressed with Foley catheter. Stomach/Bowel: Redemonstrated loop ileostomy within the right mid hemiabdomen and the sequela of near complete colectomy and small bowel resection. There is a grossly unchanged approximately 4.7 x 3.7 cm fluid collection within the presacral space (image 71, series 2) at the location of previously noted contained perforation involving a suspected loop of small bowel extends down to the midline of the lower pelvis. Mild distention of the stomach. No evidence of enteric obstruction. No pneumoperitoneum, pneumatosis or portal venous gas. Vascular/Lymphatic: Normal caliber the abdominal aorta. No bulky retroperitoneal, mesenteric, pelvic or inguinal lymphadenopathy on this noncontrast examination. Reproductive: Post hysterectomy.  No discrete adnexal lesion. Other: Diffuse body wall anasarca. Stranding within the left upper abdominal  quadrant at location of prior end colostomy. Post bilateral breast augmentation. Musculoskeletal: No acute or aggressive osseous abnormalities. No definitive evidence of osteolysis though the presacral fluid collection does abut the ventral aspect of the sacrum and coccyx. IMPRESSION: 1. Appropriately positioned bilateral percutaneous nephrostomy catheters without evidence complication. 2. Grossly unchanged approximately 4.7 cm fluid collection within the presacral space at the location of previously noted contained perforation involving a suspected loop of small bowel which extends down to the midline of the lower pelvis. While there is no evidence of osteolysis to suggest osteomyelitis, the presacral fluid collection is noted to abut the ventral aspect of the sacrum coccyx. Further evaluation with pelvic MRI could be performed as indicated. 3. Stable sequela of loop ileostomy and small bowel anastomosis without evidence of enteric obstruction. Electronically Signed   By: Sandi Mariscal M.D.   On: 09/15/2017 17:50    Medications / Allergies: per chart  Antibiotics: Anti-infectives (From admission, onward)   Start     Dose/Rate Route Frequency Ordered Stop   08/30/17 1000  ciprofloxacin (CIPRO) IVPB 200 mg  Status:  Discontinued     200 mg 100 mL/hr over 60 Minutes Intravenous Every 24 hours 08/30/17 0855 09/06/17 0805        Note: Portions of this report may have been transcribed using voice recognition software. Every effort was made to ensure accuracy; however, inadvertent computerized transcription errors may be present.   Any transcriptional errors that result from this process are unintentional.     [name], M.D., F.A.C.S. Gastrointestinal and Minimally Invasive Surgery [facility] [address], [place], [place] [address] [phone] Main / Paging    09/16/2017

## 2017-09-16 NOTE — Consult Note (Signed)
[facility] Nurse ostomy follow up Stoma type/location: RLQ ileostomy Stomal assessment/size: 1 and 1/8 inches Peristomal assessment: small area of denudation only (according to husband, who placed pouch last pm with assistance from bedside RN, Patty).  Previous pouching system last 48 hours. Treatment options for stomal/peristomal skin:  Output: brown stool. Ostomy pouching: complex pouching system with instructions in room. Education provided: none required Enrolled patient in [employer] Discharge program: Yes, previously.  Patient and husband relayed events of today, including placement of new drain and pain regimen adjustments.  Pleased with care today and report buttock improvement with Gerhart's Butt cream.  Patient is no longer on mattress replacement, but is turning side to side more often. Supplies in room.  I will see Monday. [facility] nursing team will follow, and will remain available to this patient, the nursing, surgical and medical teams.   Thanks, Maudie Flakes, MSN, RN, [place], Arther Abbott  Pager# [phone]

## 2017-09-16 NOTE — Progress Notes (Signed)
Progress note  Reason for consult: Pain management  Chart reviewed including MAR.  Discussed with bedside RN.  I saw and examined Ms. Munter this evening.  She was sitting in chair .on exam  S/p drain insertion.  As had procedure today, will continue IV pain medications.  Will hopefully transition back to oral medications tomorrow.  If necessary, will increase dilaudid to 6mg  PO (she reports 4mg  does not work, but 1mg  IV is effective.  As ratio of IV to PO dilaudid is 5:1, 6mg  PO will then be higher opioid equivalent than effective IV dose)  Will also increase fentanyl patch to 38mcg daily with next patch change.  Continue lyrica 75 mg twice daily.  F/u tomorrow.   Total time: 25 minutes Greater than 50%  of this time was spent counseling and coordinating care related to the above assessment and plan.  Micheline Rough, MD [place] Team [phone]

## 2017-09-16 NOTE — Progress Notes (Addendum)
Patient's spouse having questions about nephrostomy tubes leaking and would like to speak with Dr Tresa Moore. Call made to Dr [facility] office; Dr Tresa Moore is here at [facility] in surgery "all day" per office staff. Requested they get a message to him to visit spouse and patient or call Mr. Rodriques on his cell phone; number given to office staff. Informed Mr. Wunschel of above conversation. [name], RN

## 2017-09-16 NOTE — Sedation Documentation (Signed)
Patient is resting comfortably with eyes closed in NAD. No s/s of pain/discomfort at this time

## 2017-09-17 LAB — MAGNESIUM: MAGNESIUM: 1.7 mg/dL (ref 1.7–2.4)

## 2017-09-17 LAB — BASIC METABOLIC PANEL
ANION GAP: 9 (ref 5–15)
BUN: 45 mg/dL — ABNORMAL HIGH (ref 6–20)
CHLORIDE: 107 mmol/L (ref 101–111)
CO2: 20 mmol/L — ABNORMAL LOW (ref 22–32)
Calcium: 9.5 mg/dL (ref 8.9–10.3)
Creatinine, Ser: 2.16 mg/dL — ABNORMAL HIGH (ref 0.44–1.00)
GFR calc non Af Amer: 23 mL/min — ABNORMAL LOW (ref >60)
GFR, EST AFRICAN AMERICAN: 27 mL/min — AB (ref >60)
Glucose, Bld: 91 mg/dL (ref 65–99)
POTASSIUM: 4.1 mmol/L (ref 3.5–5.1)
Sodium: 136 mmol/L (ref 135–145)

## 2017-09-17 LAB — GLUCOSE, CAPILLARY
GLUCOSE-CAPILLARY: 90 mg/dL (ref 65–99)
GLUCOSE-CAPILLARY: 233 mg/dL — AB (ref 65–99)
GLUCOSE-CAPILLARY: 152 mg/dL — AB (ref 65–99)
GLUCOSE-CAPILLARY: 120 mg/dL — AB (ref 65–99)

## 2017-09-17 LAB — CBC
HEMATOCRIT: 25.5 % — AB (ref 36.0–46.0)
HEMOGLOBIN: 7.6 g/dL — AB (ref 12.0–15.0)
MCH: 28.6 pg (ref 26.0–34.0)
MCHC: 29.8 g/dL — AB (ref 30.0–36.0)
MCV: 95.9 fL (ref 78.0–100.0)
Platelets: 345 10*3/uL (ref 150–400)
RBC: 2.66 MIL/uL — AB (ref 3.87–5.11)
RDW: 16.4 % — ABNORMAL HIGH (ref 11.5–15.5)
WBC: 10.9 10*3/uL — AB (ref 4.0–10.5)

## 2017-09-17 NOTE — Progress Notes (Signed)
Patient ID: [name], female   DOB: 02-Nov-1955, 62 y.o.   MRN: [id number]     Referring Physician(s): Dr. Michael [name]  Supervising Physician: Daryll Brod  Patient Status: [facility] - In-pt  Chief Complaint: Bilateral hydronephrosis/pelvic fluid collection  Subjective: Patient with no new complaints.  Still have right-sided back pain despite stitches now removed from around her PCN.  Allergies: Penicillins; Ultram [tramadol]; Adhesive [tape]; Cefaclor; Erythromycin; Trimethoprim; Ciprofloxacin; Fluconazole; Oxycodone; Pectin; and Sulfa antibiotics  Medications: Prior to Admission medications   Medication Sig Start Date End Date Taking? Authorizing Provider  anastrozole (ARIMIDEX) 1 MG tablet TAKE 1 TABLET DAILY 12/08/16  Yes Gorsuch, Ni, MD  Biotin 5 MG TABS Take 5 mg by mouth every morning.    Yes [provider]  Cholecalciferol (VITAMIN D3) 10000 UNITS capsule Take 10,000 Units by mouth once a week. Sunday evening's   Yes [provider]  diphenoxylate-atropine (LOMOTIL) 2.5-0.025 MG tablet Take 1-2 tablets by mouth every 8 (eight) hours. TO PREVENT LOOSE BOWEL MOVEMENTS 05/20/17  Yes [provider]  Famotidine-Ca Carb-Mag Hydrox (PEPCID COMPLETE PO) Take 1 tablet by mouth daily as needed (INDIGESTION).   Yes [provider]  filgrastim (NEUPOGEN) 480 MCG/1.6ML injection Inject 1.6 ml under the skin every 5 days for life Patient taking differently: Inject 480 mcg into the skin See admin instructions. Inject 1.6 ml under the skin every 6 days for life 01/06/17  Yes Gorsuch, Ni, MD  levothyroxine (SYNTHROID, LEVOTHROID) 150 MCG tablet Take 150 mcg by mouth daily before breakfast.    Yes [provider]  loratadine (CLARITIN) 10 MG tablet Take 10 mg by mouth every morning.    Yes [provider]  omega-3 acid ethyl esters (LOVAZA) 1 G capsule Take 1 g by mouth 2 (two) times daily.    Yes [provider]  ondansetron  (ZOFRAN-ODT) 4 MG disintegrating tablet Take 4 mg by mouth every 6 (six) hours as needed for nausea or vomiting.  04/12/17  Yes [provider]  Polyethyl Glycol-Propyl Glycol (SYSTANE OP) Place 1 drop into both eyes at bedtime.    Yes [provider]  Prenatal Vit-Fe Fumarate-FA (PRENATAL VITAMIN PO) Take 1 capsule by mouth daily. Takes prenatal because there are no dyes in it   Yes [provider]  prochlorperazine (COMPAZINE) 10 MG tablet Take 1 tablet (10 mg total) by mouth 2 (two) times daily as needed for nausea or vomiting. Patient taking differently: Take 10 mg by mouth 2 (two) times daily as needed for nausea or vomiting.  05/28/17  Yes Virgel Manifold, MD  promethazine (PHENERGAN) 25 MG tablet Take 1 tablet (25 mg total) by mouth every 6 (six) hours as needed for nausea or vomiting. 05/28/17  Yes Virgel Manifold, MD  ranitidine (ZANTAC) 150 MG tablet Take 150 mg by mouth 2 (two) times daily as needed for heartburn.    Yes [provider]  rosuvastatin (CRESTOR) 10 MG tablet Take 10 mg by mouth every evening.    Yes [provider]  sitaGLIPtin (JANUVIA) 100 MG tablet Take 100 mg by mouth every morning.    Yes [provider]  HYDROcodone-acetaminophen (NORCO/VICODIN) 5-325 MG tablet Take 1 tablet by mouth every 6 (six) hours as needed (Pain). Patient not taking: Reported on 08/29/2017 08/10/17   Shelda Pal, DO    Vital Signs: BP 113/60 (BP Location: Right Leg)   Pulse 79   Temp 98.2 F (36.8 C) (Oral)   Resp 16  Ht 5\' 2"  (1.575 m)   Wt 206 lb 12.7 oz (93.8 kg)   SpO2 99%   BMI 37.82 kg/m   Physical Exam: Abd: Bilateral PCNs in place with clear yellow urine output.  Drain sites are currently dry and clean.  TG drain in place with thick, cloudy, bloody output. 20cc of output documented.  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 Unfortunately, the patient has experienced persistent right-sided flank pain attributable to the right-sided nephrostomy catheter. EXAM: CT ABDOMEN AND PELVIS WITHOUT CONTRAST TECHNIQUE: Multidetector CT imaging of the abdomen and pelvis was performed following the standard protocol without IV contrast. COMPARISON:  CT abdomen pelvis-08/31/2017; 02/09/2016; ultrasound fluoroscopic guided bilateral percutaneous nephrostomy catheter placement-09/02/2016; right-sided antegrade nephrostogram - 09/13/2017 FINDINGS: Lack of intravenous contrast limits the ability to evaluate solid abdominal organs. Lower chest: Limited visualization of the lower thorax demonstrates minimal subsegmental atelectasis within the imaged bilateral lower lobes, right greater than left. No discrete focal airspace opacities. No pleural effusion. Normal heart size.  No pericardial effusion. Hepatobiliary: Normal hepatic contour. Post cholecystectomy. Persistent dilatation of the common bile duct, measuring approximately 1.3 cm (as measured in greatest oblique short axis coronal diameter - image 51, series 5), presumably secondary to biliary reservoir phenomenon. No radiopaque gallstones. No ascites. Pancreas: Normal noncontrast appearance of the pancreas Spleen: Normal noncontrast appearance of the spleen Adrenals/Urinary Tract: Bilateral percutaneous nephrostomy catheters are probe with the position with very mild residual left-sided pelvicaliectasis. No significant right-sided pelvicaliectasis. Both percutaneous nephrostomy catheters take a slightly serpiginous tract however this is likely secondary to patient body habitus. No definitive associated pericatheter hematoma. There is no definitive involvement of either hemidiaphragm at both nephrostomies are below the 12th rib. Mid minimal  amount of bilateral perinephric stranding, unchanged. Previously characterized approximately 0.7 cm adenoma within left adrenal gland is unchanged (image 22, series 2). Normal noncontrast appearance of the right adrenal gland. Urinary bladder is decompressed with Foley catheter. Stomach/Bowel: Redemonstrated loop ileostomy within the right mid hemiabdomen and the sequela of near complete colectomy and small bowel resection. There is a grossly unchanged approximately 4.7 x 3.7 cm fluid collection within the presacral space (image 71, series 2) at the location of previously noted contained perforation involving a suspected loop of small bowel extends down to the midline of the lower pelvis. Mild distention of the stomach. No evidence of enteric obstruction. No pneumoperitoneum, pneumatosis or portal venous gas. Vascular/Lymphatic: Normal caliber the abdominal aorta. No bulky retroperitoneal, mesenteric, pelvic or inguinal lymphadenopathy on this noncontrast examination. Reproductive: Post hysterectomy.  No discrete adnexal lesion. Other: Diffuse body wall anasarca. Stranding within the left upper abdominal quadrant at location of prior end colostomy. Post bilateral breast augmentation. Musculoskeletal: No acute or aggressive osseous abnormalities. No definitive evidence of osteolysis though the presacral fluid collection does abut the ventral aspect of the sacrum and coccyx. IMPRESSION: 1. Appropriately positioned bilateral percutaneous nephrostomy catheters without evidence complication. 2. Grossly unchanged approximately 4.7 cm fluid collection within the presacral space at the location of previously noted contained perforation involving a suspected loop of small bowel which extends down to the midline of the lower pelvis. While there is no evidence of osteolysis to suggest osteomyelitis, the presacral fluid collection is noted to abut the ventral aspect of the sacrum coccyx. Further evaluation with pelvic  MRI could  be performed as indicated. 3. Stable sequela of loop ileostomy and small bowel anastomosis without evidence of enteric obstruction. Electronically Signed   By: Sandi Mariscal M.D.   On: 09/15/2017 17:50   Ir Nephrostogram Right Thru Existing Access  Result Date: 09/13/2017 INDICATION: History of bilateral percutaneous nephrostomy tube placement on 09/02/2017 to divert urine due to fistula between the rectum and bladder. After recent fall, the patient is complaining of significant pain in the right flank region near the site of the indwelling right nephrostomy tube. EXAM: RIGHT NEPHROSTOGRAM COMPARISON:  Imaging at the time of nephrostomy tube placement on 09/02/2017 MEDICATIONS: None ANESTHESIA/SEDATION: None CONTRAST:  5 mL Isovue-300-administered into the collecting system(s) FLUOROSCOPY TIME:  Fluoroscopy Time: 12 seconds.  3.8 mGy. COMPLICATIONS: None immediate. PROCEDURE: Under fluoroscopy, contrast material was injected via the pre-existing right percutaneous nephrostomy tube. Fluoroscopic images were saved as a cine loop. The catheter was flushed and reattached to a gravity drainage bag. FINDINGS: The percutaneous nephrostomy tube is intact without evidence of catheter rupture or contrast extravasation. The catheter is formed at the level of the renal pelvis and is well positioned. No significant filling defects are identified in the renal collecting system or opacified ureter. IMPRESSION: Unremarkable right nephrostogram via pre-existing percutaneous nephrostomy tube demonstrating intact nephrostomy tube which is positioned in the right renal pelvis. Electronically Signed   By: Aletta Edouard M.D.   On: 09/13/2017 16:10   Ct Image Guide Drain Transvag Transrect Peritoneal Retroper  Result Date: 09/16/2017 INDICATION: 62 year old female with a history of presacral abscess EXAM: CT-GUIDED DRAINAGE PRESACRAL ABSCESS MEDICATIONS: NONE ANESTHESIA/SEDATION: Fentanyl 4.0 mcg IV; Versed 200 mg IV Moderate  Sedation Time:  50 MINUTES The patient was continuously monitored during the procedure by the interventional radiology nurse under my direct supervision. COMPLICATIONS: None PROCEDURE: Informed written consent was obtained from the patient after a thorough discussion of the procedural risks, benefits and alternatives. All questions were addressed. Maximal Sterile Barrier Technique was utilized including caps, mask, sterile gowns, sterile gloves, sterile drape, hand hygiene and skin antiseptic. A timeout was performed prior to the initiation of the procedure. Patient position right decubitus and scout CT was obtained. Patient is prepped and draped in usual sterile fashion. 1% lidocaine was used for local anesthesia. Using CT guidance, modified Seldinger technique was used to place a 10 [place] drain into the presacral space. Approximately 3-4 cc of serosanguineous fluid aspirated and sent for culture. Patient tolerated the procedure well and remained hemodynamically stable throughout. No complications were encountered and no significant blood loss. IMPRESSION: Status post CT-guided drainage of presacral abscess. Signed, Dulcy Fanny. Earleen Newport, DO [facility] Radiology Electronically Signed   By: Corrie Mckusick D.O.   On: 09/16/2017 13:16    Labs:  CBC: Recent Labs    09/13/17 0335 09/15/17 0345 09/16/17 0401 09/17/17 0418  WBC 4.3 3.6* 4.2 10.9*  HGB 8.0* 7.5* 7.0* 7.6*  HCT 25.5* 24.1* 22.7* 25.5*  PLT 436* 377 342 345    COAGS: Recent Labs    04/16/17 0047 09/02/17 0939  INR 1.21 1.21    BMP: Recent Labs    09/09/17 0457  09/14/17 0410 09/15/17 0345 09/16/17 0401 09/17/17 0418  NA 131*  --   --  134* 136 136  K 3.9   < > 3.6 4.1 4.2 4.1  CL 105  --   --  106 108 107  CO2 18*  --   --  20* 19* 20*  GLUCOSE 228*  --   --  140* 172* 91  BUN 47*  --   --  38* 40* 45*  CALCIUM 8.4*  --   --  9.0 8.9 9.5  CREATININE 2.65*   < > 2.30* 2.03*  2.13* 2.16*  GFRNONAA 18*   < > 21* 25* 24* 23*  GFRAA 21*   < > 25* 29* 27* 27*   < > = values in this interval not displayed.    LIVER FUNCTION TESTS: Recent Labs    04/21/17 0416 05/27/17 1724 08/29/17 1550  09/07/17 0324 09/08/17 0355 09/09/17 0457 09/15/17 0345  BILITOT 0.5 0.3 0.5  --   --   --   --  0.3  AST 13* 24 16  --   --   --   --  15  ALT 10* 33 24  --   --   --   --  21  ALKPHOS 190* 166* 173*  --   --   --   --  131*  PROT 5.9* 9.2* 6.8  --   --   --   --  6.5  ALBUMIN 1.8* 3.9 2.1*   < > 1.9* 1.8* 1.8* 1.9*   < > = values in this interval not displayed.    Assessment and Plan: 1. Bilateral hydro, s/p B PCN placement 2. Pelvic abscess, s/p perc drain  Pelvic drain cultures: ABUNDANT WBC PRESENT, PREDOMINANTLY PMN  ABUNDANT GRAM NEGATIVE RODS  ABUNDANT GRAM POSITIVE COCCI  ABUNDANT GRAM POSITIVE RODS   All drains in appropriate position and draining well.  Cont PCNs.  Stitches removed from these drains.  Stat locks in place.  TG drain in place and cont with drain irrigations. Will follow   Electronically Signed: Henreitta Cea 09/17/2017, 2:38 PM   I spent a total of 15 Minutes at the the patient's bedside AND on the patient's hospital floor or unit, greater than 50% of which was counseling/coordinating care for B hydronephrosis, pelvic abscess

## 2017-09-17 NOTE — Progress Notes (Signed)
PROGRESS NOTE  [name]  DZH:[id number] DOB: 18-Oct-1955 DOA: 08/29/2017   PCP: Ann Held, DO   Brief Narrative:  62 year old with a history of hyperlipidemia, diabetes mellitus type 2, hypothyroidism, idiopathic neutropenia, breast cancer status post bilateral mastectomy, ovarian and endometrial cancer status post colectomy in 2016 and takedown in August 2018 with ileocecal resection" and anal anastomosis and diverting loop ileostomy along with lysis of adhesion, urethral stent exchange with psoas bladder hitch.  CT of her abdomen pelvis showed new colovesicular fistula therefore she was admitted and was also noted to have subacute renal failure.  Nephrology has been following.  Medicine team consulted for diabetes management.  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 - s/p CT guided placement of presacral drain 02/15 - Continue to provide analgesia prn   DM type II with complications of nephropathy - continue Insulin Glargine 25 U BID - Continue insulin NovoLog with meal coverage 7 units, nighttime coverage insulin - continue oral linagliptin   - keep on same regimen for now   Depression - stable this AM  - continue Zoloft and Lyrica - continue Trazodone QHS  Hyponatremia - resolved   Hypothyroidism - continue synthroid   Hypomagnesemia and hypokalemia - supplemented and WNL this AM   AKI on CKD stage III - at baseline Cr 2.32 - overall down from her typical baseline   Breast, ovarian and endometrial cancer - continue anastrozole 1 mg  Idiopathic neutropenia, anemia - Continue weekly Granix - counts overall stable but slight bump in WBC likely reactive   Hyperlipidemia - continue statin   Increased nutrient needs - low albumin  - continue nutritional supplements   Morbid obesity - meets criteria with BMI > 35 and underlying HLD, DM, CKD - Body mass index is 38.15 kg/m.  Gluteal skin breakdown  - continue  barrier cream  Consultants:   Surgery  Urology  PCT  IR  DVT prophylaxis - Lovenox SQ  Procedures and studies: 08/17/2017 1.Cystoscopy with right retrograde pyelogram. 2. Removal of right ureteral stent.  09/02/2017  1.Bilateral nephrostomy tube placement  09/13/2017 Right nephrostogram 1.Right nephrectomy tube intact, no evidence of leak, distal cath well positioned in tight renal pelvis   08/31/2017 CT abdomen pelvis without contrast 1. Rectovesical fistula 2. Probable blind-ending fistulous tract extending into the presacral space from the rectum adjacent to the orifice of the rectovesical fistula 3. Reflux of contrast material into the right kidney via the right ureter with moderate atrophy of the right kidney. Nonobstructive calculi in the lower pole collecting system of the left kidney measuring up to 11 mm.  08/30/2017 renal ultrasound: 1. Small nonobstructive calculi in the collecting systems of both kidneys. 2. Mild to moderate hydronephrosis.  Subjective: Pt reports no concerns this AM.   Objective: Vitals:   09/16/17 1150 09/16/17 1319 09/16/17 2215 09/17/17 0528  BP: 130/84 (!) 117/48  (!) 114/46  Pulse: 80 89  78  Resp: 16 16  18   Temp:  100 F (37.8 C)  99.2 F (37.3 C)  TempSrc:  Oral  Oral  SpO2: 100% 100%  100%  Weight:   93.8 kg (206 lb 12.7 oz)   Height:        Intake/Output Summary (Last 24 hours) at 09/17/2017 1137 Last data filed at 09/17/2017 1113 Gross per 24 hour  Intake 480 ml  Output 1285 ml  Net -805 ml   Filed Weights   09/14/17 0428 09/15/17 0554 09/16/17 2215  Weight: 94.6 kg (208 lb 8.9 oz) 91.2 kg (201 lb 1 oz) 93.8 kg (206 lb 12.7 oz)   Physical Exam  Constitutional: Appears well-developed and well-nourished. No distress.  CVS: RRR, S1/S2 +, no murmurs, no gallops, no carotid bruit.  Pulmonary: Effort and breath sounds normal, no stridor, rhonchi, wheezes, rales.  Abdominal: Soft. BS +,  no distension, tenderness,  rebound or guarding.  Musculoskeletal: Normal range of motion. No edema and no tenderness.   Data Reviewed:   CBC: Recent Labs  Lab 09/13/17 0335 09/15/17 0345 09/16/17 0401 09/17/17 0418  WBC 4.3 3.6* 4.2 10.9*  NEUTROABS 2.4  --   --   --   HGB 8.0* 7.5* 7.0* 7.6*  HCT 25.5* 24.1* 22.7* 25.5*  MCV 95.1 94.9 95.4 95.9  PLT 436* 377 342 468   Basic Metabolic Panel: Recent Labs  Lab 09/12/17 0329 09/13/17 0335 09/14/17 0410 09/15/17 0345 09/16/17 0401 09/17/17 0418  NA  --   --   --  134* 136 136  K 4.0 3.7 3.6 4.1 4.2 4.1  CL  --   --   --  106 108 107  CO2  --   --   --  20* 19* 20*  GLUCOSE  --   --   --  140* 172* 91  BUN  --   --   --  38* 40* 45*  CREATININE 2.47* 2.32* 2.30* 2.03* 2.13* 2.16*  CALCIUM  --   --   --  9.0 8.9 9.5  MG 1.1* 2.0 1.4* 2.1 1.6* 1.7  PHOS 4.5  --   --  4.0 4.3  --    Liver Function Tests: Recent Labs  Lab 09/15/17 0345  AST 15  ALT 21  ALKPHOS 131*  BILITOT 0.3  PROT 6.5  ALBUMIN 1.9*   CBG: Recent Labs  Lab 09/16/17 0733 09/16/17 1307 09/16/17 1750 09/16/17 2148 09/17/17 0739  GLUCAP 149* 97 180* 103* 90    Scheduled Meds: . acetaminophen  1,000 mg Oral TID WC & HS  . anastrozole  1 mg Oral Daily  . artificial tears   Both Eyes QHS  . cholecalciferol  10,000 Units Oral Weekly  . enoxaparin (LOVENOX) injection  30 mg Subcutaneous Q24H  . famotidine  20 mg Oral Daily  . fentaNYL  75 mcg Transdermal Q72H  . ferrous sulfate  325 mg Oral QHS  . Gerhardt's butt cream   Topical QID  . insulin aspart  0-20 Units Subcutaneous TID WC  . insulin aspart  0-5 Units Subcutaneous QHS  . insulin aspart  7 Units Subcutaneous TID WC  . insulin glargine  25 Units Subcutaneous BID  . levothyroxine  150 mcg Oral QAC breakfast  . lidocaine  1 patch Transdermal Q24H  . linagliptin  5 mg Oral Daily  . lip balm  1 application Topical BID  . polyvinyl alcohol  1 drop Both Eyes QHS  . pregabalin  75 mg Oral BID  . prenatal  multivitamin  1 tablet Oral Daily  . protein supplement shake  11 oz Oral BID BM  . psyllium  1 packet Oral BID  . rosuvastatin  10 mg Oral QPM  . sertraline  50 mg Oral Daily  . Tbo-Filgrastim  480 mcg Subcutaneous Weekly   Continuous Infusions:   LOS: 19 days   Time spent: 25 mins   24 minutes with > 50% of time discussing current diagnostic test results, clinical impression and plan of care.  Faye Ramsay, MD [facility] [phone]  If 7PM-7AM, please contact night-coverage [email or web address] Password [facility] 09/17/2017, 11:37 AM

## 2017-09-17 NOTE — Progress Notes (Signed)
[facility]:  [phone] General Surgery Progress Note   LOS: 19 days  POD -     Chief Complaint: Chronic pain  Assessment and Plan: 1.  Colovesical fistula  Enterolysis of adhesions, colostomy takedown, coloanal anastomosis, diverting loop ileostomy, right ureteral reimplant with psoas hitch - 04/01/2017 - Gross/Manny  Perc drain - presacral - 09/16/2017 - IR/Dr. Earleen Newport  Doing okay eating  2.  Chronic pain  On Duragesic  Appreciate help by palliative medicine 3. History of endometrioid cancer of the ovary.              Underwent hysterectomy and salpingo-oophorectomy and possible intraperitoneal chemotherapy in 10/28/2004 by Dr. Rhodia Albright at [facility].  4. Anemia -     Hgb - 7.6 - 09/17/2017 5. DVT prophylaxis - Lovenox 6. DM 7.  Chronic kidney disease    Creat - 2.16 - 09/17/2017     Has bilateral nephrostomy tubes 8.  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    Pain is still an issue, but being  helped by palliative medicine.  Anticipating going to [name] this week for rehab  Objective:   Vitals:   09/16/17 1319 09/17/17 0528  BP: (!) 117/48 (!) 114/46  Pulse: 89 78  Resp: 16 18  Temp: 100 F (37.8 C) 99.2 F (37.3 C)  SpO2: 100% 100%     Intake/Output from previous day:  02/15 0701 - 02/16 0700 In: 240 [P.O.:240] Out: 1185 [Urine:1100; Stool:85]  Intake/Output this shift:  Total I/O In: -  Out: 200 [Stool:200]   Physical Exam:   General: Older WF who is alert and oriented.    HEENT: Normal. Pupils equal. .   Lungs: clear   Abdomen: Soft   Wound: Ostomy in right mid abdomen.  Pelvic drain.  Bilateral nephrotomy tubes.   Lab Results:    Recent Labs    09/16/17 0401 09/17/17 0418  WBC 4.2 10.9*  HGB 7.0* 7.6*  HCT 22.7* 25.5*  PLT 342 345    BMET   Recent Labs    09/16/17 0401 09/17/17 0418  NA 136 136  K 4.2 4.1  CL 108 107  CO2 19* 20*  GLUCOSE 172* 91  BUN 40* 45*  CREATININE 2.13* 2.16*  CALCIUM 8.9 9.5    PT/INR  No results for input(s): LABPROT, INR in the last 72 hours.  ABG  No results for input(s): PHART, HCO3 in the last 72 hours.  Invalid input(s): PCO2, PO2   Studies/Results:  Ct Abdomen Pelvis Wo Contrast  Result Date: 09/15/2017 CLINICAL DATA:  History of ovarian and  uterine cancer complicated by development of a rectovesical fistula requiring placement of bilateral percutaneous nephrostomy catheters on 09/02/2017 for the purposes of urinary diversion. Unfortunately, the patient has experienced persistent right-sided flank pain attributable to the right-sided nephrostomy catheter. EXAM: CT ABDOMEN AND PELVIS WITHOUT CONTRAST TECHNIQUE: Multidetector CT imaging of the abdomen and pelvis was performed following the standard protocol without IV contrast. COMPARISON:  CT abdomen pelvis-08/31/2017; 02/09/2016; ultrasound fluoroscopic guided bilateral percutaneous nephrostomy catheter placement-09/02/2016; right-sided antegrade  nephrostogram - 09/13/2017 FINDINGS: Lack of intravenous contrast limits the ability to evaluate solid abdominal organs. Lower chest: Limited visualization of the lower thorax demonstrates minimal subsegmental atelectasis within the imaged bilateral lower lobes, right greater than left. No discrete focal airspace opacities. No pleural effusion. Normal heart size.  No pericardial effusion. Hepatobiliary: Normal hepatic contour. Post cholecystectomy. Persistent dilatation of the common bile duct, measuring approximately 1.3 cm (as measured in greatest oblique short axis coronal diameter - image 51, series 5), presumably secondary to biliary reservoir phenomenon. No radiopaque gallstones. No ascites. Pancreas: Normal noncontrast appearance of the pancreas Spleen: Normal noncontrast appearance of the spleen Adrenals/Urinary Tract: Bilateral percutaneous nephrostomy catheters are probe with the position with very mild residual left-sided pelvicaliectasis. No significant right-sided pelvicaliectasis. Both percutaneous nephrostomy catheters take a slightly serpiginous tract however this is likely secondary to patient body habitus. No definitive associated pericatheter hematoma. There is no definitive involvement of either hemidiaphragm at both nephrostomies are below the 12th rib. Mid minimal amount of bilateral perinephric stranding, unchanged. Previously characterized approximately 0.7 cm adenoma within left adrenal gland is unchanged (image 22, series 2). Normal noncontrast appearance of the right adrenal gland. Urinary bladder is decompressed with Foley catheter. Stomach/Bowel: Redemonstrated loop ileostomy within the right mid hemiabdomen and the sequela of near complete colectomy and small bowel resection. There is a grossly unchanged approximately 4.7 x 3.7 cm fluid collection within the presacral space (image 71, series 2) at the location of previously noted contained perforation involving a suspected loop of small  bowel extends down to the midline of the lower pelvis. Mild distention of the stomach. No evidence of enteric obstruction. No pneumoperitoneum, pneumatosis or portal venous gas. Vascular/Lymphatic: Normal caliber the abdominal aorta. No bulky retroperitoneal, mesenteric, pelvic or inguinal lymphadenopathy on this noncontrast examination. Reproductive: Post hysterectomy.  No discrete adnexal lesion. Other: Diffuse body wall anasarca. Stranding within the left upper abdominal quadrant at location of prior end colostomy. Post bilateral breast augmentation. Musculoskeletal: No acute or aggressive osseous abnormalities. No definitive evidence of osteolysis though the presacral fluid collection does abut the ventral aspect of the sacrum and coccyx. IMPRESSION: 1. Appropriately positioned bilateral percutaneous nephrostomy catheters without evidence complication. 2. Grossly unchanged approximately 4.7 cm fluid collection within the presacral space at the location of previously noted contained perforation involving a suspected loop of small bowel which extends down to the midline of the lower pelvis. While there is no evidence of osteolysis to suggest osteomyelitis, the presacral fluid collection is noted to abut the ventral aspect of the sacrum coccyx. Further evaluation with pelvic MRI could be performed as indicated. 3. Stable sequela of loop ileostomy and small bowel anastomosis without evidence of enteric obstruction. Electronically Signed   By: Sandi Mariscal M.D.   On: 09/15/2017 17:50   Ct Image Guide Drain Transvag Transrect Peritoneal Retroper  Result Date: 09/16/2017 INDICATION: 61 year old female with a history of presacral abscess EXAM: CT-GUIDED DRAINAGE PRESACRAL ABSCESS MEDICATIONS: NONE ANESTHESIA/SEDATION: Fentanyl 4.0 mcg IV; Versed 200 mg IV Moderate Sedation Time:  47 MINUTES The patient was continuously monitored during the procedure by the interventional radiology nurse under my direct supervision.  COMPLICATIONS: None PROCEDURE: Informed written consent was obtained from the patient after a thorough discussion of the procedural risks, benefits and alternatives. All questions were addressed. Maximal Sterile Barrier Technique was utilized including caps, mask, sterile gowns, sterile gloves, sterile drape, hand hygiene and skin antiseptic. A timeout was performed prior to the initiation of the procedure. Patient position right decubitus and scout CT was obtained. Patient is prepped and draped in usual sterile fashion. 1% lidocaine was used for local anesthesia. Using CT guidance, modified Seldinger technique was used to place a 10 [place] drain into the presacral space. Approximately 3-4 cc of serosanguineous fluid aspirated and sent for culture. Patient tolerated the procedure well and remained hemodynamically stable throughout. No complications were encountered and no significant blood loss. IMPRESSION: Status post CT-guided drainage of presacral abscess. Signed, Dulcy Fanny. Earleen Newport, DO [facility] Electronically Signed   By: Corrie Mckusick D.O.   On: 09/16/2017 13:16     Anti-infectives:   Anti-infectives (From admission, onward)   Start     Dose/Rate Route Frequency Ordered Stop   08/30/17 1000  ciprofloxacin (CIPRO) IVPB 200 mg  Status:  Discontinued     200 mg 100 mL/hr over 60 Minutes Intravenous Every 24 hours 08/30/17 0855 09/06/17 0805      Alphonsa Overall, MD, FACS Pager: [facility] Office: [phone] 09/17/2017

## 2017-09-17 NOTE — Progress Notes (Signed)
Progress note  Reason for consult: Pain management  Chart reviewed including MAR.  Discussed with bedside RN.  I saw and examined [name] this evening.  She was sitting in chair .on exam  S/p drain insertion.  Continue IV pain medications through tomorrow.  Will hopefully transition back to oral medications tomorrow.  If necessary, will increase dilaudid to 6mg  PO (she reports 4mg  does not work, but 1mg  IV is effective.  As ratio of IV to PO dilaudid is 5:1, 6mg  PO will then be higher opioid equivalent than effective IV dose)  Continue lyrica 75 mg twice daily.  F/u tomorrow.   Total time: 25 minutes Greater than 50%  of this time was spent counseling and coordinating care related to the above assessment and plan.  [name] Rough, MD [place] Team [phone]

## 2017-09-18 DIAGNOSIS — N135 Crossing vessel and stricture of ureter without hydronephrosis: Secondary | ICD-10-CM

## 2017-09-18 LAB — CBC
HCT: 24.2 % — ABNORMAL LOW (ref 36.0–46.0)
Hemoglobin: 7.2 g/dL — ABNORMAL LOW (ref 12.0–15.0)
MCH: 28.6 pg (ref 26.0–34.0)
MCHC: 29.8 g/dL — ABNORMAL LOW (ref 30.0–36.0)
MCV: 96 fL (ref 78.0–100.0)
PLATELETS: 333 10*3/uL (ref 150–400)
RBC: 2.52 MIL/uL — AB (ref 3.87–5.11)
RDW: 16.6 % — ABNORMAL HIGH (ref 11.5–15.5)
WBC: 8.5 10*3/uL (ref 4.0–10.5)

## 2017-09-18 LAB — COMPREHENSIVE METABOLIC PANEL
ALBUMIN: 2 g/dL — AB (ref 3.5–5.0)
ALT: 21 U/L (ref 14–54)
AST: 21 U/L (ref 15–41)
Alkaline Phosphatase: 160 U/L — ABNORMAL HIGH (ref 38–126)
Anion gap: 10 (ref 5–15)
BUN: 49 mg/dL — AB (ref 6–20)
CHLORIDE: 105 mmol/L (ref 101–111)
CO2: 21 mmol/L — AB (ref 22–32)
CREATININE: 2.31 mg/dL — AB (ref 0.44–1.00)
Calcium: 9.4 mg/dL (ref 8.9–10.3)
GFR calc Af Amer: 25 mL/min — ABNORMAL LOW (ref >60)
GFR, EST NON AFRICAN AMERICAN: 21 mL/min — AB (ref >60)
GLUCOSE: 179 mg/dL — AB (ref 65–99)
Potassium: 4 mmol/L (ref 3.5–5.1)
SODIUM: 136 mmol/L (ref 135–145)
Total Bilirubin: 0.2 mg/dL — ABNORMAL LOW (ref 0.3–1.2)
Total Protein: 6.9 g/dL (ref 6.5–8.1)

## 2017-09-18 LAB — GLUCOSE, CAPILLARY
GLUCOSE-CAPILLARY: 131 mg/dL — AB (ref 65–99)
GLUCOSE-CAPILLARY: 161 mg/dL — AB (ref 65–99)
GLUCOSE-CAPILLARY: 96 mg/dL (ref 65–99)
Glucose-Capillary: 229 mg/dL — ABNORMAL HIGH (ref 65–99)

## 2017-09-18 LAB — MAGNESIUM: MAGNESIUM: 1.5 mg/dL — AB (ref 1.7–2.4)

## 2017-09-18 MED ORDER — MAGNESIUM SULFATE 2 GM/50ML IV SOLN
2.0000 g | Freq: Once | INTRAVENOUS | Status: AC
  Administered 2017-09-18: 2 g via INTRAVENOUS
  Filled 2017-09-18: qty 50

## 2017-09-18 MED ORDER — HYDROMORPHONE HCL 1 MG/ML IJ SOLN
1.0000 mg | Freq: Once | INTRAMUSCULAR | Status: AC
  Administered 2017-09-18: 1 mg via INTRAVENOUS

## 2017-09-18 MED ORDER — FENTANYL CITRATE (PF) 100 MCG/2ML IJ SOLN
25.0000 ug | INTRAMUSCULAR | Status: DC | PRN
  Administered 2017-09-19 (×2): 25 ug via INTRAVENOUS
  Filled 2017-09-18 (×2): qty 2

## 2017-09-18 NOTE — Progress Notes (Signed)
PROGRESS NOTE  [name]  IWP:[id number] DOB: Apr 10, 1956 DOA: 08/29/2017   PCP: Ann Held, DO   Brief Narrative:  62 year old with a history of hyperlipidemia, diabetes mellitus type 2, hypothyroidism, idiopathic neutropenia, breast cancer status post bilateral mastectomy, ovarian and endometrial cancer status post colectomy in 2016 and takedown in August 2018 with ileocecal resection" and anal anastomosis and diverting loop ileostomy along with lysis of adhesion, urethral stent exchange with psoas bladder hitch.  CT of her abdomen pelvis showed new colovesicular fistula therefore she was admitted and was also noted to have subacute renal failure.  Nephrology has been following.  Medicine team consulted for diabetes management.  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 - s/p CT guided placement of presacral drain 02/15 - cont analgesia as needed   DM type II with complications of nephropathy - continue Insulin Glargine 25 U BID - Continue insulin NovoLog with meal coverage 7 units, nighttime coverage insulin - continue oral linagliptin   - reasonable glycemic control for now  Depression - stable this AM  - continue Zoloft and Lyrica - continue Trazodone QHS  Hyponatremia - resolved   Hypothyroidism - continue synthroid   Hypomagnesemia and hypokalemia - K is WNL but Mg is low, will supplement and repeat Mg in AM   AKI on CKD stage III - at baseline Cr 2.32 - Cr is up this AM and suspect pre renal etiology - will repeat BMP in AM  Breast, ovarian and endometrial cancer - continue anastrozole 1 mg  Idiopathic neutropenia, anemia - Continue weekly Granix - blood counts overall stable   Hyperlipidemia - continue statin   Increased nutrient needs - low albumin  - continue nutritional supplements   Morbid obesity - meets criteria with BMI > 35 and underlying HLD, DM, CKD - Body mass index is 38.15 kg/m.  Gluteal skin  breakdown  - continue barrier cream  Consultants:   Surgery  Urology  PCT  IR  DVT prophylaxis - Lovenox SQ  Procedures and studies: 08/17/2017 1.Cystoscopy with right retrograde pyelogram. 2. Removal of right ureteral stent.  09/02/2017  1.Bilateral nephrostomy tube placement  09/13/2017 Right nephrostogram 1.Right nephrectomy tube intact, no evidence of leak, distal cath well positioned in tight renal pelvis   08/31/2017 CT abdomen pelvis without contrast 1. Rectovesical fistula 2. Probable blind-ending fistulous tract extending into the presacral space from the rectum adjacent to the orifice of the rectovesical fistula 3. Reflux of contrast material into the right kidney via the right ureter with moderate atrophy of the right kidney. Nonobstructive calculi in the lower pole collecting system of the left kidney measuring up to 11 mm.  08/30/2017 renal ultrasound: 1. Small nonobstructive calculi in the collecting systems of both kidneys. 2. Mild to moderate hydronephrosis.  Subjective: Pt reports getting more rest and feels better this AM. Walk walking yesterday and was up in chair several times.   Objective: Vitals:   09/17/17 1342 09/17/17 2057 09/18/17 0354 09/18/17 1200  BP: 113/60 (!) 158/60 (!) 123/56 (!) 113/54  Pulse: 79 79 78 78  Resp: 16 16 18 16   Temp: 98.2 F (36.8 C) 98.7 F (37.1 C) 99.5 F (37.5 C) 99.4 F (37.4 C)  TempSrc: Oral Oral Oral Oral  SpO2: 99% 99% 100% 99%  Weight:   87.5 kg (192 lb 14.4 oz)   Height:        Intake/Output Summary (Last 24 hours) at 09/18/2017 1258 Last data filed at  09/18/2017 1245 Gross per 24 hour  Intake 480 ml  Output 3135 ml  Net -2655 ml   Filed Weights   09/15/17 0554 09/16/17 2215 09/18/17 0354  Weight: 91.2 kg (201 lb 1 oz) 93.8 kg (206 lb 12.7 oz) 87.5 kg (192 lb 14.4 oz)   Physical Exam  Constitutional: Appears calm ,NAD CVS: RRR, S1/S2 +, no murmurs, no gallops, no carotid bruit.  Pulmonary:  Effort and breath sounds normal, no stridor, rhonchi, wheezes, rales.  Abdominal: Soft. BS +,  no distension, ostomy bag in place  Musculoskeletal: Normal range of motion. No edema and no tenderness.  Neuro: Alert. Normal reflexes, muscle tone coordination. No cranial nerve deficit. Psychiatric: Normal mood and affect. Behavior, judgment stable   Data Reviewed:   CBC: Recent Labs  Lab 09/13/17 0335 09/15/17 0345 09/16/17 0401 09/17/17 0418 09/18/17 0416  WBC 4.3 3.6* 4.2 10.9* 8.5  NEUTROABS 2.4  --   --   --   --   HGB 8.0* 7.5* 7.0* 7.6* 7.2*  HCT 25.5* 24.1* 22.7* 25.5* 24.2*  MCV 95.1 94.9 95.4 95.9 96.0  PLT 436* 377 342 345 413   Basic Metabolic Panel: Recent Labs  Lab 09/12/17 0329  09/14/17 0410 09/15/17 0345 09/16/17 0401 09/17/17 0418 09/18/17 0416  NA  --   --   --  134* 136 136 136  K 4.0   < > 3.6 4.1 4.2 4.1 4.0  CL  --   --   --  106 108 107 105  CO2  --   --   --  20* 19* 20* 21*  GLUCOSE  --   --   --  140* 172* 91 179*  BUN  --   --   --  38* 40* 45* 49*  CREATININE 2.47*   < > 2.30* 2.03* 2.13* 2.16* 2.31*  CALCIUM  --   --   --  9.0 8.9 9.5 9.4  MG 1.1*   < > 1.4* 2.1 1.6* 1.7 1.5*  PHOS 4.5  --   --  4.0 4.3  --   --    < > = values in this interval not displayed.   Liver Function Tests: Recent Labs  Lab 09/15/17 0345 09/18/17 0416  AST 15 21  ALT 21 21  ALKPHOS 131* 160*  BILITOT 0.3 0.2*  PROT 6.5 6.9  ALBUMIN 1.9* 2.0*   CBG: Recent Labs  Lab 09/17/17 1211 09/17/17 1638 09/17/17 2213 09/18/17 0805 09/18/17 1128  GLUCAP 233* 152* 120* 131* 161*    Scheduled Meds: . acetaminophen  1,000 mg Oral TID WC & HS  . anastrozole  1 mg Oral Daily  . artificial tears   Both Eyes QHS  . cholecalciferol  10,000 Units Oral Weekly  . enoxaparin (LOVENOX) injection  30 mg Subcutaneous Q24H  . famotidine  20 mg Oral Daily  . fentaNYL  75 mcg Transdermal Q72H  . ferrous sulfate  325 mg Oral QHS  . insulin aspart  0-20 Units Subcutaneous  TID WC  . insulin aspart  0-5 Units Subcutaneous QHS  . insulin aspart  7 Units Subcutaneous TID WC  . insulin glargine  25 Units Subcutaneous BID  . levothyroxine  150 mcg Oral QAC breakfast  . lidocaine  1 patch Transdermal Q24H  . linagliptin  5 mg Oral Daily  . lip balm  1 application Topical BID  . polyvinyl alcohol  1 drop Both Eyes QHS  . pregabalin  75 mg Oral BID  .  prenatal multivitamin  1 tablet Oral Daily  . protein supplement shake  11 oz Oral BID BM  . psyllium  1 packet Oral BID  . rosuvastatin  10 mg Oral QPM  . sertraline  50 mg Oral Daily  . Tbo-Filgrastim  480 mcg Subcutaneous Weekly   Continuous Infusions:   LOS: 20 days   Time spent: 25 mins   24 minutes with > 50% of time discussing current diagnostic test results, clinical impression and plan of care.   Faye Ramsay, MD [facility] [phone]  If 7PM-7AM, please contact night-coverage [email or web address] Password [facility] 09/18/2017, 12:58 PM

## 2017-09-18 NOTE — Progress Notes (Signed)
Progress note  Reason for consult: Pain management  Chart reviewed including MAR.  Discussed with bedside RN.  I saw and examined [name] this evening.  She was sitting in chair on exam.  Crying and reporting pain "is too much to bear."  Reports same location of pain, but now worse and "just can't get things better."  Her husband was present and we reviewed all the emotional angst associated with continued chronic illness.  As pain seemed to worsen again with no clinical change to her conditions, it may be in her best interest to attempt opioid rotation.  Discussed opioid rotation with her and her husband, and will plan for rotation to fentanyl for the evening to see if this resolves pain.  Noted that there was more talk of discharge today and seemed to worsen after that.  I do think that there is a lot of anxiety overall and this may be contributing to pain.  S/p drain insertion.  F/u tomorrow.   Total time: 40 minutes Greater than 50%  of this time was spent counseling and coordinating care related to the above assessment and plan.  Micheline Rough, MD [place] Team [phone]

## 2017-09-19 DIAGNOSIS — N739 Female pelvic inflammatory disease, unspecified: Secondary | ICD-10-CM

## 2017-09-19 LAB — BASIC METABOLIC PANEL
ANION GAP: 9 (ref 5–15)
BUN: 44 mg/dL — ABNORMAL HIGH (ref 6–20)
CALCIUM: 8.7 mg/dL — AB (ref 8.9–10.3)
CO2: 21 mmol/L — ABNORMAL LOW (ref 22–32)
CREATININE: 2.18 mg/dL — AB (ref 0.44–1.00)
Chloride: 108 mmol/L (ref 101–111)
GFR, EST AFRICAN AMERICAN: 27 mL/min — AB (ref >60)
GFR, EST NON AFRICAN AMERICAN: 23 mL/min — AB (ref >60)
Glucose, Bld: 130 mg/dL — ABNORMAL HIGH (ref 65–99)
Potassium: 3.6 mmol/L (ref 3.5–5.1)
SODIUM: 138 mmol/L (ref 135–145)

## 2017-09-19 LAB — PREPARE RBC (CROSSMATCH)

## 2017-09-19 LAB — CBC
HCT: 21.9 % — ABNORMAL LOW (ref 36.0–46.0)
Hemoglobin: 6.5 g/dL — CL (ref 12.0–15.0)
MCH: 28.5 pg (ref 26.0–34.0)
MCHC: 29.7 g/dL — AB (ref 30.0–36.0)
MCV: 96.1 fL (ref 78.0–100.0)
PLATELETS: 303 10*3/uL (ref 150–400)
RBC: 2.28 MIL/uL — ABNORMAL LOW (ref 3.87–5.11)
RDW: 16.4 % — ABNORMAL HIGH (ref 11.5–15.5)
WBC: 8.8 10*3/uL (ref 4.0–10.5)

## 2017-09-19 LAB — GLUCOSE, CAPILLARY
GLUCOSE-CAPILLARY: 214 mg/dL — AB (ref 65–99)
Glucose-Capillary: 119 mg/dL — ABNORMAL HIGH (ref 65–99)
Glucose-Capillary: 142 mg/dL — ABNORMAL HIGH (ref 65–99)
Glucose-Capillary: 144 mg/dL — ABNORMAL HIGH (ref 65–99)

## 2017-09-19 LAB — MAGNESIUM: MAGNESIUM: 1.6 mg/dL — AB (ref 1.7–2.4)

## 2017-09-19 MED ORDER — HYDROMORPHONE HCL 1 MG/ML IJ SOLN: 1.0000 mg | INTRAMUSCULAR | Status: DC | PRN

## 2017-09-19 MED ORDER — MAGNESIUM SULFATE 2 GM/50ML IV SOLN
2.0000 g | Freq: Once | INTRAVENOUS | Status: AC
  Administered 2017-09-19: 2 g via INTRAVENOUS
  Filled 2017-09-19: qty 50

## 2017-09-19 MED ORDER — SODIUM CHLORIDE 0.9 % IV SOLN
Freq: Once | INTRAVENOUS | Status: AC
  Administered 2017-09-19: 12:00:00 via INTRAVENOUS

## 2017-09-19 MED ORDER — DIPHENHYDRAMINE HCL 25 MG PO CAPS
25.0000 mg | ORAL_CAPSULE | Freq: Four times a day (QID) | ORAL | Status: DC | PRN
  Filled 2017-09-19: qty 1

## 2017-09-19 MED ORDER — CIPROFLOXACIN HCL 500 MG PO TABS
500.0000 mg | ORAL_TABLET | Freq: Every day | ORAL | Status: DC
  Administered 2017-09-19 – 2017-09-22 (×4): 500 mg via ORAL
  Filled 2017-09-19 (×4): qty 1

## 2017-09-19 MED ORDER — HYDROMORPHONE HCL 2 MG PO TABS
6.0000 mg | ORAL_TABLET | ORAL | Status: DC | PRN
  Administered 2017-09-19: 6 mg via ORAL
  Filled 2017-09-19: qty 3

## 2017-09-19 MED ORDER — FENTANYL CITRATE (PF) 100 MCG/2ML IJ SOLN: 25.0000 ug | INTRAMUSCULAR | Status: DC | PRN

## 2017-09-19 MED ORDER — HYDROMORPHONE HCL 2 MG PO TABS
6.0000 mg | ORAL_TABLET | ORAL | Status: DC | PRN
  Administered 2017-09-19 – 2017-09-25 (×19): 6 mg via ORAL
  Filled 2017-09-19 (×21): qty 3

## 2017-09-19 MED ORDER — SERTRALINE HCL 100 MG PO TABS
100.0000 mg | ORAL_TABLET | Freq: Every day | ORAL | Status: DC
  Administered 2017-09-20 – 2017-09-23 (×4): 100 mg via ORAL
  Filled 2017-09-19 (×4): qty 1

## 2017-09-19 MED ORDER — SODIUM CHLORIDE 0.9 % IV SOLN
Freq: Once | INTRAVENOUS | Status: AC
  Administered 2017-09-19: 07:00:00 via INTRAVENOUS

## 2017-09-19 MED ORDER — HYDROMORPHONE HCL 2 MG PO TABS
2.0000 mg | ORAL_TABLET | ORAL | Status: DC | PRN
  Administered 2017-09-19: 2 mg via ORAL
  Filled 2017-09-19 (×2): qty 1

## 2017-09-19 MED ORDER — POTASSIUM CHLORIDE CRYS ER 20 MEQ PO TBCR
40.0000 meq | EXTENDED_RELEASE_TABLET | Freq: Once | ORAL | Status: AC
  Administered 2017-09-19: 40 meq via ORAL
  Filled 2017-09-19: qty 2

## 2017-09-19 MED ORDER — ALPRAZOLAM 0.5 MG PO TABS: 0.5000 mg | ORAL_TABLET | Freq: Three times a day (TID) | ORAL | Status: DC | PRN

## 2017-09-19 NOTE — Progress Notes (Signed)
PT Cancellation Note  Patient Details Name: [name] MRN: [id number] DOB: 01-Dec-1955   Cancelled Treatment:     HgB 6.5   Will need to hold off for Physical Therapy today.  Pt has been evaluated with rec for SNF.   Rica Koyanagi  PTA [facility]  Acute  Rehab Pager      [phone]

## 2017-09-19 NOTE — Consult Note (Signed)
[place] Nurse ostomy follow up Stoma type/location: RLQ ileostomy Stomal assessment/size: 1 and 1/8 inches, viewed through pouching window. Peristomal assessment: Not seen today.  Husband changed pouch earlier today prior to my arrival.  I emptied pouch today. Treatment options for stomal/peristomal skin: see complex pouching system Output: pasty brown stool (150 mls emptied from pouch) Ostomy pouching: 1pc.convex plus additional supplies.  Education provided: None Enrolled patient in [employer] Discharge program: Yes, previously    [place] Nurse wound follow up Wound type: moisture associated skin damage Measurement: R>L buttock with full and partial thickness moisture lesions.  Improving in appearance.  Much less painful.  Wound bed: Moist, red/yellow.  Less than 30% yellow today. Drainage (amount, consistency, odor) small serous Periwound:soft, mild erythema Dressing procedure/placement/frequency: Patient's perineum cleansed with tepid tap water and gently patted dry.  Gerhart's Butt Cream applied to affected areas in a 1/8 inch layer.  Patient positioned onto left side with superior leg over inferior leg to "open up" buttocks and gluteal cleft to air. She expresses appreciation for attention and comfort.  [place] nursing team will continue to follow, and  will remain available to this patient, the nursing, srurgical and medical teams.   Thanks, Maudie Flakes, MSN, RN, [place], [facility]  Pager# [phone]

## 2017-09-19 NOTE — Progress Notes (Addendum)
[facility]  [address] [place], [place] [address] Phone: [phone]  FAX: [phone]      [name] [id number] 04/17/1956  CARE TEAM:  PCP: Ann Held, DO  Outpatient Care Team: Patient Care Team: Carollee Herter, Alferd Apa, DO as PCP - General (Family Medicine) Heath Lark, MD as Consulting Physician (Hematology and Oncology) Everitt Amber, MD as Consulting Physician (Obstetrics and Gynecology) Michael [name], MD as Consulting Physician (General Surgery) Alexis Frock, MD as Consulting Physician (Urology) Altheimer, Legrand Como, MD as Consulting Physician (Endocrinology) Katy Apo, MD as Consulting Physician (Ophthalmology) Irene Limbo, MD as Consulting Physician (Plastic Surgery) Madelon Lips, MD as Consulting Physician (Nephrology)  Inpatient Treatment Team: Treatment Team: Attending Provider: Theodis Blaze, MD; Consulting Physician: Alexis Frock, MD; Registered Nurse: Illene Regulus, RN; Technician: Reuel Derby, NT; Consulting Physician: Michael [name], MD; Technician: Leda Quail, NT; Technician: Estrellita Ludwig, NT; Rounding Team: Dorthy Cooler Radiology, MD; Technician: Sueanne Margarita, [place]; Consulting Physician: Faythe Dingwall, DO; Technician: Etheleen Sia, [place]; Attending Physician: Theodis Blaze, MD; Rounding Team: Fatima Blank, MD; Registered Nurse: Pennie Rushing, RN; Registered Nurse: Mortimer Fries, RN; Occupational Therapist: Lesle Chris, OT; Physical Therapy Assistant: Diego Cory, PTA   Problem List:   Principal Problem:   MDD (major depressive disorder), single episode, moderate ([place]) Active Problems:   History of ovarian & endometrial cancer   Chronic kidney disease (CKD), stage III (moderate) ([place])   Colovesical fistula - delayed   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 s/p resection/partial vaginectomy/ LAR/colostomy  11/19/2014   Ureteral stricture, right, 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) 04/01/2017   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   Protein-calorie malnutrition, moderate ([place])   Primary hypothyroidism   DM type 2 (diabetes mellitus, type 2) ([place])   Morbid obesity ([place])   Pelvic cancer s/p colostomy takedown/loop ileostomy diversion 04/01/2017   Dehydration   ARF (acute renal failure) ([place])   Adjustment disorder with mixed anxiety and depressed mood   Poorly controlled diabetes mellitus ([place])   Pressure injury of skin   History of external beam radiation therapy to pelvis  SURGERY 04/01/2017     POST-OPERATIVE DIAGNOSIS:   Recurrent endometrial cancer status post low anterior resection and partial pelvic exeneration  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   SURGEON: Gwynneth Aliment, MD- Primary (Dr Tresa Moore assist) PROCEDURE:  XI ROBOTIC LYSIS OF ADHESIONS x 5 hours XI ROBOTIC COLOSTOMY TAKEDOWN ROBOTIC SEWN COLOANAL ANASTOMOSIS Ileocecal resection with anastomosis Diverting loop ileostomy  SURGEON: Alexis Frock, MD - Primary (Dr Johney Maine assist) PROCEDURE:  Cystoscopy with R retrograde and stent exchange.  Robotic R ureteral reimplant with psoas bladder hitch.  Consults: urology   SURGERY 08/17/2017  DIAGNOSIS:  Right ureteral stricture, status post complex reconstruction.  PROCEDURES: 1. Cystoscopy with right retrograde pyelogram. 2. Removal of right ureteral stent.  PHYSICIAN:  Alexis Frock, MD       FINDINGS: 1. Fusiform bladder shape consistent with prior psoas hitch. 2. Visibly patent ureteral-bladder anastomosis. 3. No evidence of hydronephrosis or narrowing with right retrograde     pyelogram.  09/02/2017  Bilateral nephrostomy tube placement  09/16/2017  CT guided perc drainage of presacral fluid collection.  Cx +Group B strep   Assessment  Delayed bladder leak turned into colovesical fistula  with preferential urinary drainage in decompressed diverted colon limb - diverted urine w bilateral nephrostomy tubes & foley.  Plan:   OK to d/c  from surgery standpoint.  I will help follow manage the pelvic presacral drain.  Ileostomy and gluteal care per outpatient wound ostomy clinic.  Closest one in [employer] until [facility] can establish one.  Nephrostomy tubes and Foley per urology.  Depression and anxiety per primary care/psuchiatry.  Chronic pain per palliative care ideally  Low pelvic fluid collection most likely between colon that is functioning as a neorectum at a very low coloanal anastomosis.  Status post percutaneous drainage.  Numerous organisms on Gram stain grew out strep B.  Will place on Cipro for 10 days.  It is low volume, but it seems to be controlling some of the drainage better.  Keep for at least a few weeks.  Follow-up drain study.  See what interventional radiology thinks.  Gradually improving pain control.  Palliative care following closely.  I agree that a lot of this is exacerbated by paroxysms of severe anxiety and depression amplifying her symptoms.  Fentanyl patch for now given her renal insufficiency.  Still needing IV pain meds.  Lidocaine patch may be helping pain on right back nephrostomy tube site.    Oral Dilaudid dose helps at 4 mg only.  Scheduled Tylenol to QID.  Lyrica for chronic pain / depression - increased from 75 to 173m to 738mBID.  Robaxin PRN.     URINE LEAK PER UROLOGY.  Pain at R back nephrostomy tube site - Xrays w/o fracture.  Nephrostomy tube injection showing no leak or abnormality.  CT scan did not show any obvious malpositioning of the tubes.  Urology and interventional radiology feel like it is in good position but may do new drain to mitigate possiblitiy of pinched intercostal nerve - defer to them.  Stitches at skin removed.  Change dressing more often.    Gluteal skin breakdown - stabilized & improved.  Nephrostomy tubes diverting more  urine better.  Foley in bladder.  Pelvic drain diverting some chronic drainage as well.  Continue barrier cream.  Urinary diversion as much as possible - still not 100% & small anal urine leakage, but improved.  Pelvic drain at presacral fluid collection could help divert fluid away control it better, allowing the region to dry up.  Powders PRN.  GET HER UP to avoid deeper decubitus formation -she is doing better with mobility now.  Colovesical fistula due to bladder breakdown & delayed leak.  Urinary diversion s/p percutaneous nephrostomy tubes to divert urine directly.  Foley to catch any more urine.  Still leaking urine output to anus.  Somewhat improved but fair.  Suspect Ileal conduit imminent   Restart ciprofloxacin was group B in the low pelvic fluid collection.  Would just do a 10-day course since it does not seem to be particularly purulent.  Normally would do penicillin but with her allergy will do the alternative.  She seems to have tolerated Cipro earlier.  Mag low - replacing.  K & Phos OK.  Follow  Zoloft for depression.  Psychiatric evaluation done - added trazadone QHS & rec PCP f/u.  Psych f/u PRN  Diabetes better controlled.  D/w TRH.  Continue lantus Bid & PO.  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 may be getting transfusion since less than 7.  Iron deficiency anemia with borderline hemoglobin.  Given another dose of IV dextran this admission.  Oral iron - QHS.  She had difficulty absorbing and tolerating.  With urine leak better controlled, perhaps she can tolerate better now  Solid  diet.  Bowel regimen.  Output under control w/o massive antidiarrheals.    Ostomy care - not major issues w ileostomy.  Ileostomy pink and skin healthy.  Continue neupogen injections for pancytopenia  VTE prophylaxis: SCDs, etc  Ambulate as tolerated to help recovery    Leak on the left lateral inferiolateral corner of the cystostomy Heineke-Mikulicz and psoas hitch  needed for the bladder to reach the right ureter at that had the chronic severe distal stricture.  I am highly skeptical that a radiated bladder with a delayed leak will spontaneously heal.  She had internal Foley drainage, external surgical drainage, internal stenting drainage for a few months, yet this happened. Therefore, most likely will require surgical repair.  Standard of care usually on this situation is to resect colon & do a new distal anastomosis. Primarily repair the bladder & patch it.  However, there is no way I can redo anastomosis in a radiated pelvis with prior surgeries with prior anterior exoneration, radiation, 7-hour lysis adhesions and prior surgery when she was in better shape.  Only options I can see is to try & primarily patch the colon with OMENTOPEXY, primarily repair the bladder with OMENTOPEXY, and hope it will heal.  I am skeptical that it will.  Most likely we will have to convert to an end colostomy to get the colon away from it and allow the bladder to be patched vs cystectomy & ileal conduit.  I do agree with Dr. Tresa Moore that she is rather malnourished and deconditioned at this point so would like to try and hold off on definitive repair if we can.  We will discuss that later once she is in better shape to actually tolerate a reoperation should it come to that (very likely).  Home health nursing seems to have failed.  Lack of outpatient ostomy clinic in town here making outpatient management challenging.   I strongly suspect she will need a SNF with rehab.  Patient more motivated to get up and move around.  She is still embarrassed in frustrated with persistent urine leak out anus.  But she is getting up some.  Work to try and transition to outpatient rehab with SNF soon.  Needs better pain control     40 minutes spent in review, evaluation, examination, counseling, and coordination of care.  More than 50% of that time was spent in counseling.  [name], M.D.,  F.A.C.S. Gastrointestinal and Minimally Invasive Surgery [facility] [address], [place], [place] [address] [phone] Main / Paging    09/19/2017    Subjective: (Chief complaint)  Less gluteal pain & less rectal drainage with pelvic drain in place.  Husband hoping or change ileostomy bag.  Hemoglobin in the sixes.  Not lightheaded or dizzy.  Was walking the hallways.  Still occasional severe sharp twinges of pain at R nephrostomy site.    Objective:  Vital signs:  Vitals:   09/18/17 0354 09/18/17 1200 09/18/17 2156 09/19/17 0500  BP: (!) 123/56 (!) 113/54 (!) 134/57 (!) 108/50  Pulse: 78 78 75 77  Resp: _0 Temp: 99.5 F (37.5 C) 99.4 F (37.4 C) 99 F (37.2 C) 99.5 F (37.5 C)  TempSrc: Oral Oral Oral Oral  SpO2: 100% 99% 100%   Weight: 87.5 kg (192 lb 14.4 oz)   88.6 kg (195 lb 5.2 oz)  Height:        Last BM Date: 09/18/17  Intake/Output  Yesterday:  02/17 0701 - 02/18 0700 In: 50 [IV Piggyback:50] Out: 3000 [Urine:2075; Stool:925] This shift:  No intake/output data recorded.  Bowel function:  Flatus: YES   BM:  YES.  Thick dark brown paste consistency succus in ileostomy bag.  Drain: No drains.     Physical Exam:  General: Pt awake/alert/oriented x4 in no acute distress. Eyes: PERRL, normal EOM.  Sclera clear.  No icterus Neuro: CN II-XII intact w/o focal sensory/motor deficits. Lymph: No head/neck/groin lymphadenopathy Psych:  No delerium/psychosis/paranoia.  Crying & tired.  Somewhat consolable HENT: Normocephalic, Mucus membranes moist.  No thrush Neck: Supple, No tracheal deviation Chest: No chest wall pain w good excursion CV:  Pulses intact.  Regular rhythm MS: Normal AROM mjr joints.  No obvious deformity.  Bilateral perc nephrostomy bags - R posterior sharp pain at skin site but no cellultitis  Abdomen: Soft.  Nondistended.  No tenderness to palpation.  No evidence of peritonitis.   No incarcerated hernias.  No wounds.  Ileostomy pink.  Skin clean around ileostomy.      GU.  No purulent drainage or hematoma.  Foley in place with clear light yellow urine  Rectal.  Gluteal skin breakdown superficial and stable with barrier cream.  Ext:   No deformity.  No mjr edema.  No cyanosis Skin: No petechiae / purpura  Results:   Labs: Results for orders placed or performed during the hospital encounter of 08/29/17 (from the past 48 hour(s))  Glucose, capillary     Status: Abnormal   Collection Time: 09/17/17 12:11 PM  Result Value Ref Range   Glucose-Capillary 233 (H) 65 - 99 mg/dL  Glucose, capillary     Status: Abnormal   Collection Time: 09/17/17  4:38 PM  Result Value Ref Range   Glucose-Capillary 152 (H) 65 - 99 mg/dL  Glucose, capillary     Status: Abnormal   Collection Time: 09/17/17 10:13 PM  Result Value Ref Range   Glucose-Capillary 120 (H) 65 - 99 mg/dL  CBC     Status: Abnormal   Collection Time: 09/18/17  4:16 AM  Result Value Ref Range   WBC 8.5 4.0 - 10.5 K/uL   RBC 2.52 (L) 3.87 - 5.11 MIL/uL   Hemoglobin 7.2 (L) 12.0 - 15.0 g/dL   HCT 24.2 (L) 36.0 - 46.0 %   MCV 96.0 78.0 - 100.0 fL   MCH 28.6 26.0 - 34.0 pg   MCHC 29.8 (L) 30.0 - 36.0 g/dL   RDW 16.6 (H) 11.5 - 15.5 %   Platelets 333 150 - 400 K/uL    Comment: Performed at [facility], [place] [address], [place], [place] [address]  Comprehensive metabolic panel     Status: Abnormal   Collection Time: 09/18/17  4:16 AM  Result Value Ref Range   Sodium 136 135 - 145 mmol/L   Potassium 4.0 3.5 - 5.1 mmol/L   Chloride 105 101 - 111 mmol/L   CO2 21 (L) 22 - 32 mmol/L   Glucose, Bld 179 (H) 65 - 99 mg/dL   BUN 49 (H) 6 - 20 mg/dL   Creatinine, Ser 2.31 (H) 0.44 - 1.00 mg/dL   Calcium 9.4 8.9 - 10.3 mg/dL   Total Protein 6.9 6.5 - 8.1 g/dL   Albumin 2.0 (L) 3.5 - 5.0 g/dL   AST 21 15 - 41 U/L   ALT 21 14 - 54 U/L   Alkaline Phosphatase 160 (H) 38 - 126 U/L   Total Bilirubin  0.2 (L) 0.3 - 1.2 mg/dL   GFR calc non Af Amer 21 (L) >60 mL/min   GFR calc Af Amer 25 (L) >60 mL/min    Comment: (NOTE) The eGFR has been calculated using the CKD EPI equation. This calculation has not been validated in all clinical situations. eGFR's persistently <60 mL/min signify possible Chronic Kidney Disease.    Anion gap 10 5 - 15    Comment: Performed at [facility], [place] [address] [place], [place] [address]  Magnesium     Status: Abnormal   Collection Time: 09/18/17  4:16 AM  Result Value Ref Range   Magnesium 1.5 (L) 1.7 - 2.4 mg/dL    Comment: Performed at [facility], [place] [address], [place], [place] [address]  Glucose, capillary     Status: Abnormal   Collection Time: 09/18/17  8:05 AM  Result Value Ref Range   Glucose-Capillary 131 (H) 65 - 99 mg/dL  Glucose, capillary     Status: Abnormal   Collection Time: 09/18/17 11:28 AM  Result Value Ref Range   Glucose-Capillary 161 (H) 65 - 99 mg/dL  Glucose, capillary     Status: None   Collection Time: 09/18/17  4:03 PM  Result Value Ref Range   Glucose-Capillary 96 65 - 99 mg/dL  Glucose, capillary     Status: Abnormal   Collection Time: 09/18/17  9:55 PM  Result Value Ref Range   Glucose-Capillary 229 (H) 65 - 99 mg/dL  Magnesium     Status: Abnormal   Collection Time: 09/19/17  4:12 AM  Result Value Ref Range   Magnesium 1.6 (L) 1.7 - 2.4 mg/dL    Comment: Performed at [facility], [place] [address], [place], [place] [address]  CBC     Status: Abnormal   Collection Time: 09/19/17  4:12 AM  Result Value Ref Range   WBC 8.8 4.0 - 10.5 K/uL   RBC 2.28 (L) 3.87 - 5.11 MIL/uL   Hemoglobin 6.5 (LL) 12.0 - 15.0 g/dL    Comment: CRITICAL RESULT CALLED TO, READ BACK BY AND VERIFIED WITH: PORTTE, P RN AT 0526 ON 09/19/2017 BY OKOYEHJ REPEATED TO VERIFY    HCT 21.9 (L) 36.0 - 46.0 %   MCV 96.1 78.0 - 100.0 fL   MCH 28.5 26.0 - 34.0 pg   MCHC 29.7 (L) 30.0 -  36.0 g/dL   RDW 16.4 (H) 11.5 - 15.5 %   Platelets 303 150 - 400 K/uL    Comment: Performed at [facility], [place] [address] [place], [place] [address]  Basic metabolic panel     Status: Abnormal   Collection Time: 09/19/17  4:12 AM  Result Value Ref Range   Sodium 138 135 - 145 mmol/L   Potassium 3.6 3.5 - 5.1 mmol/L   Chloride 108 101 - 111 mmol/L   CO2 21 (L) 22 - 32 mmol/L   Glucose, Bld 130 (H) 65 - 99 mg/dL   BUN 44 (H) 6 - 20 mg/dL   Creatinine, Ser 2.18 (H) 0.44 - 1.00 mg/dL   Calcium 8.7 (L) 8.9 - 10.3 mg/dL   GFR calc non Af Amer 23 (L) >60 mL/min   GFR calc Af Amer 27 (L) >60 mL/min    Comment: (NOTE) The eGFR has been calculated using the CKD EPI equation. This calculation has not been validated in all clinical situations. eGFR's persistently <60 mL/min signify possible Chronic Kidney Disease.    Anion gap  9 5 - 15    Comment: Performed at [facility], [place] [address] [place], [place] [address]  Glucose, capillary     Status: Abnormal   Collection Time: 09/19/17  7:38 AM  Result Value Ref Range   Glucose-Capillary 119 (H) 65 - 99 mg/dL    Imaging / Studies: No results found.  Medications / Allergies: per chart  Antibiotics: Anti-infectives (From admission, onward)   Start     Dose/Rate Route Frequency Ordered Stop   08/30/17 1000  ciprofloxacin (CIPRO) IVPB 200 mg  Status:  Discontinued     200 mg 100 mL/hr over 60 Minutes Intravenous Every 24 hours 08/30/17 0855 09/06/17 0805        Note: Portions of this report may have been transcribed using voice recognition software. Every effort was made to ensure accuracy; however, inadvertent computerized transcription errors may be present.   Any transcriptional errors that result from this process are unintentional.     [name], M.D., F.A.C.S. Gastrointestinal and Minimally Invasive Surgery [facility] [address] [place], [place] [address] [phone] Main / Paging    09/19/2017

## 2017-09-19 NOTE — Progress Notes (Addendum)
PROGRESS NOTE  Merrillyn Ackerley  TSV:[id number] DOB: 24-Feb-1956 DOA: 08/29/2017   PCP: Ann Held, DO   Brief Narrative:  62 year old with a history of hyperlipidemia, diabetes mellitus type 2, hypothyroidism, idiopathic neutropenia, breast cancer status post bilateral mastectomy, ovarian and endometrial cancer status post colectomy in 2016 and takedown in August 2018 with ileocecal resection" and anal anastomosis and diverting loop ileostomy along with lysis of adhesion, urethral stent exchange with psoas bladder hitch.  CT of her abdomen pelvis showed new colovesicular fistula therefore she was admitted and was also noted to have subacute renal failure.  Nephrology has been following.  Medicine team consulted for diabetes management.  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 - s/p CT guided placement of presacral drain 02/15, gram stain with group B strep - pt already placed on Cipro to complete 10 days therapy  - continue analgesia as needed   DM type II with complications of nephropathy - continue Insulin Glargine 25 U BID - Continue insulin NovoLog with meal coverage 7 units, nighttime coverage insulin - continue oral linagliptin   - reasonable glycemic control for now   Depression - still with intermittent episodes of crying and depression  - continue Zoloft and Lyrica - continue Trazodone QHS  Hyponatremia - resolved   Hypothyroidism - continue synthroid   Hypomagnesemia and hypokalemia - K is WNL but on low end of normal - Mg is low, will supplement - repeat BMP and Mg in AM  AKI on CKD stage III - at baseline Cr 2.32 - Cr trending down - BMP in AM  Breast, ovarian and endometrial cancer - continue anastrozole 1 mg  Idiopathic neutropenia, anemia - Continue weekly Granix - Hg drop noted but no evidence of active bleeding - transfuse two U PRBC - CBC in AM  Hyperlipidemia - continue statin   Increased nutrient  needs - low albumin  - continue nutritional supplements   Morbid obesity - meets criteria with BMI > 35 and underlying HLD, DM, CKD - Body mass index is 38.15 kg/m.  Gluteal skin breakdown  - continue barrier cream  Consultants:   Surgery  Urology  PCT  IR  DVT prophylaxis - Lovenox SQ  Procedures and studies: 08/17/2017 1.Cystoscopy with right retrograde pyelogram. 2. Removal of right ureteral stent.  09/02/2017  1.Bilateral nephrostomy tube placement  09/13/2017 Right nephrostogram 1.Right nephrectomy tube intact, no evidence of leak, distal cath well positioned in tight renal pelvis   08/31/2017 CT abdomen pelvis without contrast 1. Rectovesical fistula 2. Probable blind-ending fistulous tract extending into the presacral space from the rectum adjacent to the orifice of the rectovesical fistula 3. Reflux of contrast material into the right kidney via the right ureter with moderate atrophy of the right kidney. Nonobstructive calculi in the lower pole collecting system of the left kidney measuring up to 11 mm.  08/30/2017 renal ultrasound: 1. Small nonobstructive calculi in the collecting systems of both kidneys. 2. Mild to moderate hydronephrosis.  Subjective: Pt reports ongoing pain mostly on there right side.   Objective: Vitals:   09/18/17 2156 09/19/17 0500 09/19/17 1139 09/19/17 1200  BP: (!) 134/57 (!) 108/50 (!) 142/79 139/73  Pulse: 75 77 63 64  Resp: 18 16 18 18   Temp: 99 F (37.2 C) 99.5 F (37.5 C) 98.9 F (37.2 C) 98.8 F (37.1 C)  TempSrc: Oral Oral Oral Oral  SpO2: 100%  100% 100%  Weight:  88.6 kg (195 lb 5.2  oz)    Height:        Intake/Output Summary (Last 24 hours) at 09/19/2017 1226 Last data filed at 09/19/2017 1200 Gross per 24 hour  Intake 50 ml  Output 2875 ml  Net -2825 ml   Filed Weights   09/16/17 2215 09/18/17 0354 09/19/17 0500  Weight: 93.8 kg (206 lb 12.7 oz) 87.5 kg (192 lb 14.4 oz) 88.6 kg (195 lb 5.2 oz)    Physical Exam  Constitutional: Appears calm, NAD CVS: RRR, S1/S2 +, no murmurs, no gallops, no carotid bruit.  Pulmonary: Effort and breath sounds normal, no stridor, rhonchi, wheezes, rales.  Abdominal: Soft. BS +,  no distension, tenderness, ostomy bag in place  Musculoskeletal: Normal range of motion. No edema and no tenderness.   Data Reviewed:   CBC: Recent Labs  Lab 09/13/17 0335 09/15/17 0345 09/16/17 0401 09/17/17 0418 09/18/17 0416 09/19/17 0412  WBC 4.3 3.6* 4.2 10.9* 8.5 8.8  NEUTROABS 2.4  --   --   --   --   --   HGB 8.0* 7.5* 7.0* 7.6* 7.2* 6.5*  HCT 25.5* 24.1* 22.7* 25.5* 24.2* 21.9*  MCV 95.1 94.9 95.4 95.9 96.0 96.1  PLT 436* 377 342 345 333 332   Basic Metabolic Panel: Recent Labs  Lab 09/15/17 0345 09/16/17 0401 09/17/17 0418 09/18/17 0416 09/19/17 0412  NA 134* 136 136 136 138  K 4.1 4.2 4.1 4.0 3.6  CL 106 108 107 105 108  CO2 20* 19* 20* 21* 21*  GLUCOSE 140* 172* 91 179* 130*  BUN 38* 40* 45* 49* 44*  CREATININE 2.03* 2.13* 2.16* 2.31* 2.18*  CALCIUM 9.0 8.9 9.5 9.4 8.7*  MG 2.1 1.6* 1.7 1.5* 1.6*  PHOS 4.0 4.3  --   --   --    Liver Function Tests: Recent Labs  Lab 09/15/17 0345 09/18/17 0416  AST 15 21  ALT 21 21  ALKPHOS 131* 160*  BILITOT 0.3 0.2*  PROT 6.5 6.9  ALBUMIN 1.9* 2.0*   CBG: Recent Labs  Lab 09/18/17 1128 09/18/17 1603 09/18/17 2155 09/19/17 0738 09/19/17 1142  GLUCAP 161* 96 229* 119* 214*    Scheduled Meds: . acetaminophen  1,000 mg Oral TID WC & HS  . anastrozole  1 mg Oral Daily  . artificial tears   Both Eyes QHS  . cholecalciferol  10,000 Units Oral Weekly  . ciprofloxacin  500 mg Oral Daily  . enoxaparin (LOVENOX) injection  30 mg Subcutaneous Q24H  . famotidine  20 mg Oral Daily  . fentaNYL  75 mcg Transdermal Q72H  . ferrous sulfate  325 mg Oral QHS  . insulin aspart  0-20 Units Subcutaneous TID WC  . insulin aspart  0-5 Units Subcutaneous QHS  . insulin aspart  7 Units Subcutaneous TID  WC  . insulin glargine  25 Units Subcutaneous BID  . levothyroxine  150 mcg Oral QAC breakfast  . lidocaine  1 patch Transdermal Q24H  . linagliptin  5 mg Oral Daily  . lip balm  1 application Topical BID  . polyvinyl alcohol  1 drop Both Eyes QHS  . pregabalin  75 mg Oral BID  . prenatal multivitamin  1 tablet Oral Daily  . protein supplement shake  11 oz Oral BID BM  . psyllium  1 packet Oral BID  . rosuvastatin  10 mg Oral QPM  . sertraline  50 mg Oral Daily  . Tbo-Filgrastim  480 mcg Subcutaneous Weekly   Continuous Infusions:  LOS: 21 days   Time spent: 25 mins   25 minutes with > 50% of time discussing current diagnostic test results, clinical impression and plan of care.  Faye Ramsay, MD [facility] [phone]  If 7PM-7AM, please contact night-coverage [email or web address] Password [facility] 09/19/2017, 12:26 PM

## 2017-09-19 NOTE — Progress Notes (Signed)
OT Cancellation Note  Patient Details Name: [name] MRN: [id number] DOB: August 16, 1955   Cancelled Treatment:    Reason Eval/Treat Not Completed: Medical issues which prohibited therapy. Pt is getting 2 units of blood today; Hgb 6.5. Will check back another day.  Tywon Niday 09/19/2017, 2:00 PM  Lesle Chris, OTR/L [phone] 09/19/2017

## 2017-09-19 NOTE — Progress Notes (Addendum)
Palliative Care progress note  Reason for consult: Pain management  Chart reviewed including MAR.  Discussed with bedside RN.  I saw and examined [name] this evening.  She was lying in bed.  Sister in law at bedside.    Pain is a little improved today, but she continues to report being distressed as she does not have specific cause why her pain is worse.    We discussed moving back to oral medications, and I increased dilaudid to 6mg  PO every 3 hours as needed (prior she was on 4mg  PO and this was ineffective for pain relief).  Continue fentanyl as second line pain medication to be used 40 minutes after oral medication if oral medication is ineffective.  Also discussed possibly starting zyprexa (I still believe that anxiety is driving a lot of her pain exacerbations).  She declined to do so.  I increased her zoloft as it has been 2 weeks since any changes.  F/u tomorrow.   Total time: 40 minutes Greater than 50%  of this time was spent counseling and coordinating care related to the above assessment and plan.  Micheline Rough, MD [facility] [phone]

## 2017-09-20 LAB — TYPE AND SCREEN
ABO/RH(D): A POS
Antibody Screen: NEGATIVE
Unit division: 0
Unit division: 0

## 2017-09-20 LAB — BPAM RBC
BLOOD PRODUCT EXPIRATION DATE: 201902252359
Blood Product Expiration Date: 201903032359
ISSUE DATE / TIME: 201902181716
ISSUE DATE / TIME: 201902181131
UNIT TYPE AND RH: 600
Unit Type and Rh: 600

## 2017-09-20 LAB — CBC
HEMATOCRIT: 29.2 % — AB (ref 36.0–46.0)
Hemoglobin: 9.1 g/dL — ABNORMAL LOW (ref 12.0–15.0)
MCH: 29 pg (ref 26.0–34.0)
MCHC: 31.2 g/dL (ref 30.0–36.0)
MCV: 93 fL (ref 78.0–100.0)
Platelets: 287 10*3/uL (ref 150–400)
RBC: 3.14 MIL/uL — ABNORMAL LOW (ref 3.87–5.11)
RDW: 17.4 % — AB (ref 11.5–15.5)
WBC: 14.4 10*3/uL — ABNORMAL HIGH (ref 4.0–10.5)

## 2017-09-20 LAB — BASIC METABOLIC PANEL
Anion gap: 8 (ref 5–15)
BUN: 35 mg/dL — AB (ref 6–20)
CO2: 19 mmol/L — AB (ref 22–32)
Calcium: 8.7 mg/dL — ABNORMAL LOW (ref 8.9–10.3)
Chloride: 109 mmol/L (ref 101–111)
Creatinine, Ser: 1.88 mg/dL — ABNORMAL HIGH (ref 0.44–1.00)
GFR calc Af Amer: 32 mL/min — ABNORMAL LOW (ref >60)
GFR, EST NON AFRICAN AMERICAN: 27 mL/min — AB (ref >60)
GLUCOSE: 171 mg/dL — AB (ref 65–99)
POTASSIUM: 4 mmol/L (ref 3.5–5.1)
Sodium: 136 mmol/L (ref 135–145)

## 2017-09-20 LAB — GLUCOSE, CAPILLARY
GLUCOSE-CAPILLARY: 155 mg/dL — AB (ref 65–99)
Glucose-Capillary: 164 mg/dL — ABNORMAL HIGH (ref 65–99)

## 2017-09-20 LAB — MAGNESIUM: MAGNESIUM: 1.5 mg/dL — AB (ref 1.7–2.4)

## 2017-09-20 MED ORDER — SODIUM CHLORIDE 0.9% FLUSH
5.0000 mL | Freq: Three times a day (TID) | INTRAVENOUS | Status: DC
  Administered 2017-09-20 – 2017-09-25 (×8): 5 mL

## 2017-09-20 MED ORDER — MAGNESIUM SULFATE 2 GM/50ML IV SOLN
2.0000 g | Freq: Once | INTRAVENOUS | Status: AC
  Administered 2017-09-20: 2 g via INTRAVENOUS
  Filled 2017-09-20: qty 50

## 2017-09-20 MED ORDER — PREMIER PROTEIN SHAKE: 11.0000 [oz_av] | Freq: Two times a day (BID) | ORAL | Status: DC | PRN

## 2017-09-20 NOTE — Progress Notes (Signed)
PROGRESS NOTE  [name]  RUE:[id number] DOB: 1956-01-11 DOA: 08/29/2017   PCP: Ann Held, DO   Brief Narrative:  62 year old with a history of hyperlipidemia, diabetes mellitus type 2, hypothyroidism, idiopathic neutropenia, breast cancer status post bilateral mastectomy, ovarian and endometrial cancer status post colectomy in 2016 and takedown in August 2018 with ileocecal resection" and anal anastomosis and diverting loop ileostomy along with lysis of adhesion, urethral stent exchange with psoas bladder hitch.  CT of her abdomen pelvis showed new colovesicular fistula therefore she was admitted and was also noted to have subacute renal failure.  Nephrology has been following.  Medicine team consulted for diabetes management.  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 - s/p bilateral PCN's placed on 02/01  - s/p CT guided placement of presacral drain 02/15, gram stain with group B strep - pt already placed on Cipro to complete 10 days therapy, today is day #2/10 - continue analgesia as needed  - pt with ongoing discomfort at the right nephrostomy site - per IR, do not recommend replacement of existing right PCN at new access point - pt will need CT A/P without contrast in next 2-3 days to re evaluate pre sacral abscess and possibly drain injection before removal  DM type II with complications of nephropathy - continue Insulin Glargine 25 U BID - Continue insulin NovoLog with meal coverage 7 units, nighttime coverage insulin - continue oral linagliptin   - reasonable glycemic control   Depression - in better spirits this AM - continue Zoloft and Lyrica - continue Trazodone QHS  Hyponatremia - resolved   Hypothyroidism - continue synthroid   Hypomagnesemia and hypokalemia - supplemented but Mg still low, will continue to supplement  - repeat BMP and Mg in AM  AKI on CKD stage III - at baseline Cr 2.32 - Cr is trending down  -  BMP In AM  Breast, ovarian and endometrial cancer - continue anastrozole 1 mg  Idiopathic neutropenia, anemia - Continue weekly Granix - transfused two U PRBC 02/18 and now Hg up appropriately post transfusion  - not clear why WBC is up but will monitor closely   Hyperlipidemia - continue statin   Increased nutrient needs - low albumin  - continue nutritional supplements   Morbid obesity - meets criteria with BMI > 35 and underlying HLD, DM, CKD - Body mass index is 38.15 kg/m.  Gluteal skin breakdown  - continue barrier cream - WOC consult   Consultants:   Surgery  Urology  PCT  IR  DVT prophylaxis - Lovenox SQ  Procedures and studies: 08/17/2017 1.Cystoscopy with right retrograde pyelogram. 2. Removal of right ureteral stent.  09/02/2017  1.Bilateral nephrostomy tube placement  09/13/2017 Right nephrostogram 1.Right nephrectomy tube intact, no evidence of leak, distal cath well positioned in tight renal pelvis   08/31/2017 CT abdomen pelvis without contrast 1. Rectovesical fistula 2. Probable blind-ending fistulous tract extending into the presacral space from the rectum adjacent to the orifice of the rectovesical fistula 3. Reflux of contrast material into the right kidney via the right ureter with moderate atrophy of the right kidney. Nonobstructive calculi in the lower pole collecting system of the left kidney measuring up to 11 mm.  08/30/2017 renal ultrasound: 1. Small nonobstructive calculi in the collecting systems of both kidneys. 2. Mild to moderate hydronephrosis.  Subjective: Pt reports ongoing pain in the right back area at the PCN tube site.   Objective: Vitals:  09/19/17 2228 09/20/17 0500 09/20/17 0555 09/20/17 1422  BP: (!) 110/59  112/70 (!) 135/57  Pulse: 76  81 78  Resp:   17 18  Temp:   98.9 F (37.2 C) 98.7 F (37.1 C)  TempSrc:   Oral Oral  SpO2:   99% 100%  Weight:  87.8 kg (193 lb 9 oz)    Height:         Intake/Output Summary (Last 24 hours) at 09/20/2017 1840 Last data filed at 09/20/2017 1423 Gross per 24 hour  Intake 1310 ml  Output 3275 ml  Net -1965 ml   Filed Weights   09/18/17 0354 09/19/17 0500 09/20/17 0500  Weight: 87.5 kg (192 lb 14.4 oz) 88.6 kg (195 lb 5.2 oz) 87.8 kg (193 lb 9 oz)   Physical Exam  Constitutional: Appears well-developed and well-nourished. No distress.  CVS: RRR, S1/S2 +, no murmurs, no gallops, no carotid bruit.  Pulmonary: Effort and breath sounds normal, no stridor, rhonchi, wheezes, rales.  Abdominal: Soft. BS +,  no distension, tenderness, rebound or guarding.  Musculoskeletal: Normal range of motion. No edema and no tenderness.   Data Reviewed:   CBC: Recent Labs  Lab 09/16/17 0401 09/17/17 0418 09/18/17 0416 09/19/17 0412 09/20/17 0511  WBC 4.2 10.9* 8.5 8.8 14.4*  HGB 7.0* 7.6* 7.2* 6.5* 9.1*  HCT 22.7* 25.5* 24.2* 21.9* 29.2*  MCV 95.4 95.9 96.0 96.1 93.0  PLT 342 345 333 303 630   Basic Metabolic Panel: Recent Labs  Lab 09/15/17 0345 09/16/17 0401 09/17/17 0418 09/18/17 0416 09/19/17 0412 09/20/17 0511  NA 134* 136 136 136 138 136  K 4.1 4.2 4.1 4.0 3.6 4.0  CL 106 108 107 105 108 109  CO2 20* 19* 20* 21* 21* 19*  GLUCOSE 140* 172* 91 179* 130* 171*  BUN 38* 40* 45* 49* 44* 35*  CREATININE 2.03* 2.13* 2.16* 2.31* 2.18* 1.88*  CALCIUM 9.0 8.9 9.5 9.4 8.7* 8.7*  MG 2.1 1.6* 1.7 1.5* 1.6* 1.5*  PHOS 4.0 4.3  --   --   --   --    Liver Function Tests: Recent Labs  Lab 09/15/17 0345 09/18/17 0416  AST 15 21  ALT 21 21  ALKPHOS 131* 160*  BILITOT 0.3 0.2*  PROT 6.5 6.9  ALBUMIN 1.9* 2.0*   CBG: Recent Labs  Lab 09/19/17 1142 09/19/17 1730 09/19/17 2050 09/20/17 0758 09/20/17 1157  GLUCAP 214* 142* 144* 164* 155*    Scheduled Meds: . acetaminophen  1,000 mg Oral TID WC & HS  . anastrozole  1 mg Oral Daily  . artificial tears   Both Eyes QHS  . cholecalciferol  10,000 Units Oral Weekly  .  ciprofloxacin  500 mg Oral Daily  . enoxaparin (LOVENOX) injection  30 mg Subcutaneous Q24H  . famotidine  20 mg Oral Daily  . fentaNYL  75 mcg Transdermal Q72H  . ferrous sulfate  325 mg Oral QHS  . insulin aspart  0-20 Units Subcutaneous TID WC  . insulin aspart  0-5 Units Subcutaneous QHS  . insulin aspart  7 Units Subcutaneous TID WC  . insulin glargine  25 Units Subcutaneous BID  . levothyroxine  150 mcg Oral QAC breakfast  . lidocaine  1 patch Transdermal Q24H  . linagliptin  5 mg Oral Daily  . lip balm  1 application Topical BID  . polyvinyl alcohol  1 drop Both Eyes QHS  . pregabalin  75 mg Oral BID  . prenatal multivitamin  1  tablet Oral Daily  . psyllium  1 packet Oral BID  . rosuvastatin  10 mg Oral QPM  . sertraline  100 mg Oral Daily  . sodium chloride flush  5 mL Intracatheter Q8H  . Tbo-Filgrastim  480 mcg Subcutaneous Weekly   Continuous Infusions:   LOS: 22 days   Time spent: 25 mins   25 minutes with > 50% of time discussing current diagnostic test results, clinical impression and plan of care.  Faye Ramsay, MD [facility] [phone]  If 7PM-7AM, please contact night-coverage [email or web address] Password [facility] 09/20/2017, 6:40 PM

## 2017-09-20 NOTE — Progress Notes (Signed)
Daily Progress Note   Patient Name: [name]       Date: 09/20/2017 DOB: 1955-12-21  Age: 62 y.o. MRN#: [id number] Attending Physician: Theodis Blaze, MD Primary Care Physician: Carollee Herter, Alferd Apa, DO Admit Date: 08/29/2017  Reason for Consultation/Follow-up: Non pain symptom management, Pain control and Psychosocial/spiritual support  Subjective: Chart reviewed including MAR.   Mrs. Maeder, is in good spirits this morning. She was sitting up in bed eating breakfast. No family at bedside. Reports she has a good appetite this morning and currently only having some mild pain on the right side and some on the left side, more so around were the abscess was drained.  She reports it is to early to tell if current pain regimen is the best however she can tell an increase in pain relief. States she had a good night and was able to get plenty of rest which she is thankful for.   Medications were adjusted yesterday. Dilaudid PO was increased to 6mg  PO PRN with fentanyl 25-50mg  IV available for breakthrough as needed. She used a total of 12mg  PO Dilaudid and 70mcg IV fentanyl over the past 24 hours. She also has on a 23mcg fentanyl patch which she is tolerating. She hopes to be able to get out of the bed more today.   Length of Stay: 22  Current Medications: Scheduled Meds:  . acetaminophen  1,000 mg Oral TID WC & HS  . anastrozole  1 mg Oral Daily  . artificial tears   Both Eyes QHS  . cholecalciferol  10,000 Units Oral Weekly  . ciprofloxacin  500 mg Oral Daily  . enoxaparin (LOVENOX) injection  30 mg Subcutaneous Q24H  . famotidine  20 mg Oral Daily  . fentaNYL  75 mcg Transdermal Q72H  . ferrous sulfate  325 mg Oral QHS  . insulin aspart  0-20 Units Subcutaneous TID WC  . insulin aspart   0-5 Units Subcutaneous QHS  . insulin aspart  7 Units Subcutaneous TID WC  . insulin glargine  25 Units Subcutaneous BID  . levothyroxine  150 mcg Oral QAC breakfast  . lidocaine  1 patch Transdermal Q24H  . linagliptin  5 mg Oral Daily  . lip balm  1 application Topical BID  . polyvinyl alcohol  1 drop Both Eyes QHS  .  pregabalin  75 mg Oral BID  . prenatal multivitamin  1 tablet Oral Daily  . protein supplement shake  11 oz Oral BID BM  . psyllium  1 packet Oral BID  . rosuvastatin  10 mg Oral QPM  . sertraline  100 mg Oral Daily  . Tbo-Filgrastim  480 mcg Subcutaneous Weekly    Continuous Infusions:   PRN Meds: ALPRAZolam, alum & mag hydroxide-simeth, diphenhydrAMINE, fentaNYL (SUBLIMAZE) injection, guaiFENesin-dextromethorphan, HYDROmorphone, loperamide, methocarbamol, metoCLOPramide, prochlorperazine, sodium chloride flush, traZODone  Physical Exam  Constitutional: She is oriented to person, place, and time. Vital signs are normal.  Abdominal: There is tenderness.  All drains intact   Musculoskeletal:  Generalized weakness   Neurological: She is alert and oriented to person, place, and time.             Vital Signs: BP 112/70 (BP Location: Right Leg)   Pulse 81   Temp 98.9 F (37.2 C) (Oral)   Resp 17   Ht 5\' 2"  (1.575 m)   Wt 87.8 kg (193 lb 9 oz)   SpO2 99%   BMI 35.40 kg/m  SpO2: SpO2: 99 % O2 Device: O2 Device: Not Delivered O2 Flow Rate: O2 Flow Rate (L/min): 2 L/min  Intake/output summary:   Intake/Output Summary (Last 24 hours) at 09/20/2017 0929 Last data filed at 09/20/2017 0555 Gross per 24 hour  Intake 956 ml  Output 2650 ml  Net -1694 ml   LBM: Last BM Date: 09/19/17 Baseline Weight: Weight: 82.9 kg (182 lb 12.2 oz) Most recent weight: Weight: 87.8 kg (193 lb 9 oz)       Palliative Assessment/Data: PPS 50%      Patient Active Problem List   Diagnosis Date Noted  . MDD (major depressive disorder), single episode, moderate ([place])   .  Poorly controlled diabetes mellitus ([place]) 08/31/2017  . Pressure injury of skin 08/31/2017  . 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 History of external beam radiation therapy to pelvis 08/31/2017  . ARF (acute renal failure) ([place]) 08/30/2017  . Adjustment disorder with mixed anxiety and depressed mood 08/30/2017  . Dehydration 08/29/2017  . Chronic kidney disease (CKD), stage III (moderate) ([place]) 08/24/2017  . Wound of right buttock 08/10/2017  . 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[place]) 06/20/2017  . Protein-calorie malnutrition, moderate ([place]) 04/18/2017  . Postoperative anemia 04/04/2017  . 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 Hot flashes related to aromatase inhibitor therapy 03/18/2017  . 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 Vitamin D insufficiency 08/30/2016  . Breast cancer of upper-inner quadrant of left female breast ([place])   . Pelvic pain in female 03/19/2015  . Chronic anemia 12/30/2014  . UTI (urinary tract infection)   . Pancytopenia, acquired ([place]) 11/26/2014  . Morbid obesity ([name]) 11/20/2014  . 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 Abdominal pain 09/09/2014  . Postmenopausal bleeding 09/05/2014  . History of ovarian & endometrial cancer 07/17/2012  . 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[place]) 05/10/2012  . Chronic neutropenia ([place]) 12/14/2011  . Primary hypothyroidism 12/14/2011  . DM type 2 (diabetes mellitus, type 2) ([place]) 12/14/2011  . Benign essential HTN 12/14/2011    Palliative Care Assessment & Plan   Patient Profile: 62 year old with a history of hyperlipidemia, diabetes mellitus type 2, hypothyroidism, idiopathic neutropenia, breast cancer status post bilateral mastectomy, ovarian and  endometrial  cancer status post colectomy in 2016 and takedown in August 2018 with ileocecal resection" and anal anastomosis and diverting loop ileostomy along with lysis of adhesion, urethral stent exchange with psoas bladder hitch.  CT of her abdomen pelvis showed new colovesicular fistula therefore she was admitted and was also noted to have subacute renal failure.  Nephrology has been following  Assessment:  Colovesicular fistula   Depression  Hypothyroidism  AKI on CKD stage III  Breast, ovarian and endometrial cancer  Idiopathic neutropenia, anemia   Hyperlipidemia   Gluteal skin breakdown    Recommendations/Plan:  Full Code   Fentanyl 7mcg Patch for long-acting pain relief  Dilaudid 6 mg PO PRN for breakthrough pain  Fentanyl 25-13mcg IV for pain not resolved with PO dilaudid   WOC to continue assessing and managing wounds and buttocks breakdown  Will continue to support providers, patient, and family during hospitalization for symptom management  Goals of Care and Additional Recommendations:  Limitations on Scope of Treatment: Full Scope Treatment  Code Status:    Code Status Orders  (From admission, onward)        Start     Ordered   08/29/17 1905  Full code  Continuous     08/29/17 1904    Code Status History    Date Active Date Inactive Code Status Order ID Comments User Context   08/26/2017 13:18 08/29/2017 18:31 Full Code [id number]  Michael [name], MD Outpatient   08/25/2017 12:28 08/26/2017 13:18 Full Code [id number]  Michael [name], MD Outpatient   06/20/2017 08:36 08/17/2017 09:58 Full Code [id number]  Michael [name], MD Outpatient   04/16/2017 12:44 04/25/2017 17:05 Full Code [id number]  Excell Seltzer, MD Inpatient   04/02/2017 02:00 04/07/2017 20:54 Full Code [id number]  Michael [name], MD Inpatient   12/30/2014 23:30 01/02/2015 15:38 Full Code [id number]  Rise Patience, MD Inpatient   12/02/2014 16:49 12/12/2014 20:24 Full Code [id number]  Everitt Amber, MD Inpatient     11/19/2014 19:02 11/25/2014 18:43 Full Code [id number]  Michael [name], MD Inpatient    Advance Directive Documentation     Most Recent Value  Type of Advance Directive  Healthcare Power of [place], Living will  Pre-existing out of facility DNR order (yellow form or pink MOST form)  No data  "MOST" Form in Place?  No data      Discharge Planning:  SNF for rehabilitation pending attending recommendations   Care plan was discussed with patient.  Thank you for allowing the Palliative Medicine Team to assist in the care of this patient.   Total Time 25 min Prolonged Time Billed  No      Greater than 50%  of this time was spent counseling and coordinating care related to the above assessment and plan.  Alda Lea, AGNP-C Palliative Medicine Team  Phone: [phone] Fax: [phone]  Please contact Palliative Medicine Team phone at [phone] for questions and concerns.    Addendum: Patient seen and examined along with Ms Cousar NP Agree with above note Continue current pain management options and monitor Loistine Chance MD [facility] health palliative medicine team [phone]

## 2017-09-20 NOTE — Progress Notes (Signed)
Nutrition Follow-up  DOCUMENTATION CODES:   Obesity unspecified  INTERVENTION:   -Continue Premier Protein BID PRN, each supplement provides 160 kcal and 30 grams of protein.  -Provide daily snacks (10am & HS) -Encourage PO intake  NUTRITION DIAGNOSIS:   Increased nutrient needs related to post-op healing as evidenced by estimated needs.  Ongoing.  GOAL:   Patient will meet greater than or equal to 90% of their needs  Progressing.  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   Pt continues to consume 100% of meals. Pt reports feeling tired of eating the same things from the hospital menu. Pt is trying to eat different combinations, states her family has brought her some meals from home. Pt likes greek yogurt and RD encouraged pt's family to bring greek yogurt as a snack for patient. Also, placed order for daily snacks as pt states she gets hungry after the kitchen has closed at night.   Pt drinking Premier Protein but still does not like when they are ordered. Placed PRN order so pt can just request from unit when she wants a supplement.  Medications: Ferrous sulfate tablet daily, Prenatal MVI tablet daily, Metamucil packet BID Labs reviewed: CBGs: 155-164 Low Mg GFR: 27   Diet Order:  Skin care precautions Diet Carb Modified Fluid consistency: Thin; Room service appropriate? Yes  EDUCATION NEEDS:   Education needs have been addressed(Will provide handout at follow-up)  Skin:  Skin Assessment: Skin Integrity Issues: Skin Integrity Issues:: Stage I, Stage II Stage I: buttocks Stage II: buttocks  Last BM:  2/19  Height:   Ht Readings from Last 1 Encounters:  09/05/17 5\' 2"  (1.575 m)    Weight:   Wt  Readings from Last 1 Encounters:  09/20/17 193 lb 9 oz (87.8 kg)    Ideal Body Weight:  52.3 kg  BMI:  Body mass index is 35.4 kg/m.  Estimated Nutritional Needs:   Kcal:  1650-1850  Protein:  65-75g  Fluid:  1.8L/day  Clayton Bibles, MS, RD, LDN [place] Dietitian Pager: [phone] After Hours Pager: [phone]

## 2017-09-20 NOTE — Progress Notes (Signed)
Referring Physician(s): Manny,T/TGross,S  Supervising Physician: Daryll Brod  Patient Status:  [facility] - In-pt  Chief Complaint: Bladder leak, presacral abscess   Subjective: Pt currently lying in bed, playing cards; cont to have some rt flank pain and soreness at TG drain site; reports slightly less drainage of fluid from rectum   Allergies: Penicillins; Adhesive [tape]; Cefaclor; Erythromycin; Trimethoprim; Ultram [tramadol]; Ciprofloxacin; Fluconazole; Oxycodone; Pectin; and Sulfa antibiotics  Medications: Prior to Admission medications   Medication Sig Start Date End Date Taking? Authorizing Provider  anastrozole (ARIMIDEX) 1 MG tablet TAKE 1 TABLET DAILY 12/08/16  Yes Gorsuch, Ni, MD  Biotin 5 MG TABS Take 5 mg by mouth every morning.    Yes [provider]  Cholecalciferol (VITAMIN D3) 10000 UNITS capsule Take 10,000 Units by mouth once a week. Sunday evening's   Yes [provider]  diphenoxylate-atropine (LOMOTIL) 2.5-0.025 MG tablet Take 1-2 tablets by mouth every 8 (eight) hours. TO PREVENT LOOSE BOWEL MOVEMENTS 05/20/17  Yes [provider]  Famotidine-Ca Carb-Mag Hydrox (PEPCID COMPLETE PO) Take 1 tablet by mouth daily as needed (INDIGESTION).   Yes [provider]  filgrastim (NEUPOGEN) 480 MCG/1.6ML injection Inject 1.6 ml under the skin every 5 days for life Patient taking differently: Inject 480 mcg into the skin See admin instructions. Inject 1.6 ml under the skin every 6 days for life 01/06/17  Yes Gorsuch, Ni, MD  levothyroxine (SYNTHROID, LEVOTHROID) 150 MCG tablet Take 150 mcg by mouth daily before breakfast.    Yes [provider]  loratadine (CLARITIN) 10 MG tablet Take 10 mg by mouth every morning.    Yes [provider]  omega-3 acid ethyl esters (LOVAZA) 1 G capsule Take 1 g by mouth 2 (two) times daily.    Yes [provider]  ondansetron (ZOFRAN-ODT) 4 MG disintegrating tablet Take 4 mg by mouth  every 6 (six) hours as needed for nausea or vomiting.  04/12/17  Yes [provider]  Polyethyl Glycol-Propyl Glycol (SYSTANE OP) Place 1 drop into both eyes at bedtime.    Yes [provider]  Prenatal Vit-Fe Fumarate-FA (PRENATAL VITAMIN PO) Take 1 capsule by mouth daily. Takes prenatal because there are no dyes in it   Yes [provider]  prochlorperazine (COMPAZINE) 10 MG tablet Take 1 tablet (10 mg total) by mouth 2 (two) times daily as needed for nausea or vomiting. Patient taking differently: Take 10 mg by mouth 2 (two) times daily as needed for nausea or vomiting.  05/28/17  Yes Virgel Manifold, MD  promethazine (PHENERGAN) 25 MG tablet Take 1 tablet (25 mg total) by mouth every 6 (six) hours as needed for nausea or vomiting. 05/28/17  Yes Virgel Manifold, MD  ranitidine (ZANTAC) 150 MG tablet Take 150 mg by mouth 2 (two) times daily as needed for heartburn.    Yes [provider]  rosuvastatin (CRESTOR) 10 MG tablet Take 10 mg by mouth every evening.    Yes [provider]  sitaGLIPtin (JANUVIA) 100 MG tablet Take 100 mg by mouth every morning.    Yes [provider]  HYDROcodone-acetaminophen (NORCO/VICODIN) 5-325 MG tablet Take 1 tablet by mouth every 6 (six) hours as needed (Pain). Patient not taking: Reported on 08/29/2017 08/10/17   Shelda Pal, DO     Vital Signs: BP (!) 135/57 (BP Location: Right Leg)   Pulse 78   Temp 98.7 F (37.1 C) (Oral)   Resp 18   Ht 5' 2" (1.575 m)  Wt 193 lb 9 oz (87.8 kg)   SpO2 100%   BMI 35.40 kg/m   Physical Exam right and left PCN's and TG drains intact, all drains irrigated; some pain with TG drain irrigation, feculent appearing fluid in bag; outputs L-500 cc, R- 150 cc, TG- 5-10 cc  Imaging: No results found.  Labs:  CBC: Recent Labs    09/17/17 0418 09/18/17 0416 09/19/17 0412 09/20/17 0511  WBC 10.9* 8.5 8.8 14.4*  HGB 7.6* 7.2* 6.5* 9.1*  HCT 25.5* 24.2* 21.9*  29.2*  PLT 345 333 303 287    COAGS: Recent Labs    04/16/17 0047 09/02/17 0939  INR 1.21 1.21    BMP: Recent Labs    09/17/17 0418 09/18/17 0416 09/19/17 0412 09/20/17 0511  NA 136 136 138 136  K 4.1 4.0 3.6 4.0  CL 107 105 108 109  CO2 20* 21* 21* 19*  GLUCOSE 91 179* 130* 171*  BUN 45* 49* 44* 35*  CALCIUM 9.5 9.4 8.7* 8.7*  CREATININE 2.16* 2.31* 2.18* 1.88*  GFRNONAA 23* 21* 23* 27*  GFRAA 27* 25* 27* 32*    LIVER FUNCTION TESTS: Recent Labs    05/27/17 1724 08/29/17 1550  09/08/17 0355 09/09/17 0457 09/15/17 0345 09/18/17 0416  BILITOT 0.3 0.5  --   --   --  0.3 0.2*  AST 24 16  --   --   --  15 21  ALT 33 24  --   --   --  21 21  ALKPHOS 166* 173*  --   --   --  131* 160*  PROT 9.2* 6.8  --   --   --  6.5 6.9  ALBUMIN 3.9 2.1*   < > 1.8* 1.8* 1.9* 2.0*   < > = values in this interval not displayed.    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 Annamaria Boots do not recommend replacement of existing right PCN at new access point; she will need f/u CT A/P without contrast in next 2-3 days to reevaluate presacral abscess and possibly drain injection before removal since output looks and smells feculent.      Electronically Signed: D. Rowe Robert, PA-C 09/20/2017, 4:35 PM   I spent a total of 20 minutes at the the patient's bedside AND on the patient's hospital floor or unit, greater than 50% of which was counseling/coordinating care for bilateral nephrostomies/presacral abscess    Patient ID: [name], female   DOB: Mar 08, 1956, 62 y.o.   MRN: [id number]

## 2017-09-20 NOTE — Consult Note (Signed)
[facility] Nurse wound consult note Reason for Consult: [facility] Nurse is reconsulted by Dr. Olen Pel for buttock moisture lesions.  Patient was seen yesterday 09/19/17 and is seen frequently for this issue.  See Dr. Johney Maine' note dated 09/19/17 in which he noted that area is superficial and stable and that he agrees with the continuation of Ger[employer]'s Butt Cream. Note that patient was placed on a mattress replacement with low air loss feature but that she found this bed to be less modern and with fewer features than the standard pressure redistribution bed (most notably it did not have phone charging ports) and it was discontinued at her request after only a few days.  When OOB to the chair, the patient does use a pressure redistribution chair cushion. Patient is frequently reminded to lie in a side lying position so that moisture does not pool in the gluteal cleft.  Patient most often prefers the supine position with knees bent, making friction and moisture management, as well as keeping the prescribed ointment on her lesions a challenge. Patient's and husband's frustration at her situation is understood. Appreciate the continued support of Dr. Domingo Cocking and the Palliative Care team as we seek a solution to the patient's discomfort and feelings of fear, frustration and anxiety.  [facility] nursing team will continue to follow for both this area and the management of patient's ileostomy, and will remain available to this patient, the nursing, surgical and medical teams.   Thanks, Maudie Flakes, MSN, RN, [place], Arther Abbott  Pager# [phone]

## 2017-09-20 NOTE — Progress Notes (Signed)
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 Pt had her stent removed 08/17/17 and admitted 08/29/17 for acute renal failure.      Follow Up Recommendations  SNF            Precautions / Restrictions Precautions Precautions: Fall Precaution Comments: Multiple lines (catheter, bil PCN tubes) Restrictions Weight Bearing Restrictions: No       Mobility Bed Mobility Overal bed mobility: Needs Assistance Bed Mobility: Supine to Sit;Sit to Supine     Supine to sit: Mod assist Sit to supine: Mod assist   General bed mobility comments: assist upper body with supine to sit and B LE's sit to supine  Transfers Overall transfer level: Needs assistance Equipment used: Rolling walker (2 wheeled) Transfers: Sit to/from Stand Sit to Stand: Min assist;Min guard Stand pivot transfers: Min assist;Min guard       General transfer comment: increased time   slow moving due to pain        ADL either performed or assessed with clinical judgement   ADL Overall ADL's : Needs assistance/impaired                                       General ADL Comments: Pt had walked with PT this day  but was very willing to perform her HEP.  Pt needed VC for technique of exercise but did have good participation despite verbalizing she was fatigued     Vision Patient Visual Report: No change from baseline     Perception     Praxis      Cognition Arousal/Alertness: Awake/alert Behavior During Therapy: WFL for tasks assessed/performed Overall Cognitive Status: Within Functional Limits for tasks  assessed                                 General Comments: emotionally labile        Exercises Shoulder Exercises Shoulder Flexion: AROM;Strengthening;Both;Theraband;Supine;20 reps Shoulder ABduction: AROM;Both;Strengthening;Theraband;Supine;20 reps Elbow Flexion: AROM;Theraband;Supine;20 reps Elbow Extension: AROM;Strengthening;Both;Theraband;Supine;20 reps Theraband Level (Elbow Extension): Other (comment)(orange)   Shoulder Instructions       General Comments      Pertinent Vitals/ Pain       Pain Assessment: 0-10 Pain Score: 3  Pain Location: R back Pain Descriptors / Indicators: Discomfort Pain Intervention(s): Premedicated before session     Prior Functioning/Environment              Frequency  Min 2X/week        Progress Toward Goals  OT Goals(current goals can now be found in the care plan section)        Plan Discharge plan remains appropriate    Co-evaluation                 AM-PAC PT "6 Clicks" Daily Activity     Outcome Measure   Help from another person eating meals?: None Help from another person taking care of personal grooming?: None Help from another person toileting, which includes using toliet, bedpan, or urinal?:  A Lot Help from another person bathing (including washing, rinsing, drying)?: A Lot Help from another person to put on and taking off regular upper body clothing?: A Little Help from another person to put on and taking off regular lower body clothing?: A Lot 6 Click Score: 17    End of Session    OT Visit Diagnosis: Unsteadiness on feet (R26.81);Muscle weakness (generalized) (M62.81);Pain   Activity Tolerance Patient tolerated treatment well   Patient Left with call bell/phone within reach;in chair;with family/visitor present   Nurse Communication Mobility status        Time: 1250-1305 OT Time Calculation (min): 15 min  Charges: OT General Charges $OT Visit: 1 Visit OT  Treatments $Therapeutic Exercise: 8-22 mins  Justice, [place]   [name] 09/20/2017, 1:50 PM

## 2017-09-20 NOTE — Progress Notes (Signed)
Physical Therapy Treatment Patient Details Name: [name] MRN: [id number] DOB: [birth year] Today's Date: 09/20/2017    History of Present Illness Pt is a 62 year old female with hx of bilateral mastectomies for breast cancer, chronic neutropenia,  h/o ovarian and endometrial CA, h/o colostomy 2016, w/takedown/loop ileostomy diversion as well as resection of ureteral stricture w/ bladder hitch reimplantation all in 03/2017. Pt had her stent removed 08/17/17 and admitted 08/29/17 for acute renal failure.    PT Comments    Pt very motivated and will but limited by pain esp R buttock drain site.  "Feels like a needle is stabbing me deep".  Pt was premedicated.  Assisted OOB required increased time and slow movement.  Assisted with amb in hallway with increased time as pt required several standing rest breaks due to pain.  R buttock drain "hurts the most".  Pt highly motivated but struggled to complete distance.  Required increased time to place back to bed comfortably.    Follow Up Recommendations  SNF     Equipment Recommendations  None recommended by PT    Recommendations for Other Services       Precautions / Restrictions Precautions Precautions: Fall Precaution Comments: Multiple lines (catheter, bil PCN tubes) Restrictions Weight Bearing Restrictions: No    Mobility  Bed Mobility Overal bed mobility: Needs Assistance Bed Mobility: Supine to Sit;Sit to Supine     Supine to sit: Mod assist Sit to supine: Mod assist   General bed mobility comments: assist upper body with supine to sit and B LE's sit to supine  Transfers Overall transfer level: Needs assistance Equipment used: Rolling walker (2 wheeled) Transfers: Sit to/from Stand Sit to Stand: Min assist;Min guard Stand pivot transfers: Min assist;Min guard       General transfer comment: increased time   slow moving due to pain  Ambulation/Gait Ambulation/Gait assistance: Min guard;Supervision Ambulation Distance  (Feet): 285 Feet Assistive device: Rolling walker (2 wheeled) Gait Pattern/deviations: Step-through pattern;Decreased stride length;Trunk flexed Gait velocity: decreased    General Gait Details: required several standing rest breaks due to pain.  Very motivated but limited by pain at all 3 drain sites.     Stairs            Wheelchair Mobility    Modified Rankin (Stroke Patients Only)       Balance                                            Cognition Arousal/Alertness: Awake/alert Behavior During Therapy: WFL for tasks assessed/performed Overall Cognitive Status: Within Functional Limits for tasks assessed                                 General Comments: emotionally labile      Exercises      General Comments        Pertinent Vitals/Pain Pain Assessment: Faces Pain Location: R hip and back on R side ("something is wrong with that drain") Pain Descriptors / Indicators: Sharp;Constant Pain Intervention(s): Monitored during session;Repositioned    Home Living                      Prior Function            PT Goals (current goals can now be found in  the care plan section) Progress towards PT goals: Progressing toward goals    Frequency    Min 3X/week      PT Plan Current plan remains appropriate    Co-evaluation              AM-PAC PT "6 Clicks" Daily Activity  Outcome Measure  Difficulty turning over in bed (including adjusting bedclothes, sheets and blankets)?: A Lot Difficulty moving from lying on back to sitting on the side of the bed? : A Lot Difficulty sitting down on and standing up from a chair with arms (e.g., wheelchair, bedside commode, etc,.)?: A Lot Help needed moving to and from a bed to chair (including a wheelchair)?: A Little Help needed walking in hospital room?: A Little Help needed climbing 3-5 steps with a railing? : A Lot 6 Click Score: 14    End of Session Equipment  Utilized During Treatment: Gait belt Activity Tolerance: Patient limited by pain Patient left: in bed;with call bell/phone within reach;with nursing/sitter in room Nurse Communication: Mobility status PT Visit Diagnosis: Difficulty in walking, not elsewhere classified (R26.2) Pain - Right/Left: Right     Time: 1150-1230 PT Time Calculation (min) (ACUTE ONLY): 40 min  Charges:  $Gait Training: 23-37 mins $Therapeutic Activity: 8-22 mins                    G Codes:       [name]  PTA [facility]  Acute  Rehab Pager      [phone]

## 2017-09-21 LAB — CBC
HCT: 27.8 % — ABNORMAL LOW (ref 36.0–46.0)
Hemoglobin: 8.5 g/dL — ABNORMAL LOW (ref 12.0–15.0)
MCH: 29 pg (ref 26.0–34.0)
MCHC: 30.6 g/dL (ref 30.0–36.0)
MCV: 94.9 fL (ref 78.0–100.0)
Platelets: 264 10*3/uL (ref 150–400)
RBC: 2.93 MIL/uL — AB (ref 3.87–5.11)
RDW: 17.4 % — ABNORMAL HIGH (ref 11.5–15.5)
WBC: 11.8 10*3/uL — ABNORMAL HIGH (ref 4.0–10.5)

## 2017-09-21 LAB — BASIC METABOLIC PANEL
Anion gap: 8 (ref 5–15)
BUN: 31 mg/dL — ABNORMAL HIGH (ref 6–20)
CHLORIDE: 112 mmol/L — AB (ref 101–111)
CO2: 19 mmol/L — ABNORMAL LOW (ref 22–32)
Calcium: 9 mg/dL (ref 8.9–10.3)
Creatinine, Ser: 2.03 mg/dL — ABNORMAL HIGH (ref 0.44–1.00)
GFR calc Af Amer: 29 mL/min — ABNORMAL LOW (ref >60)
GFR calc non Af Amer: 25 mL/min — ABNORMAL LOW (ref >60)
GLUCOSE: 138 mg/dL — AB (ref 65–99)
POTASSIUM: 4.1 mmol/L (ref 3.5–5.1)
Sodium: 139 mmol/L (ref 135–145)

## 2017-09-21 LAB — GLUCOSE, CAPILLARY
GLUCOSE-CAPILLARY: 187 mg/dL — AB (ref 65–99)
GLUCOSE-CAPILLARY: 87 mg/dL (ref 65–99)
GLUCOSE-CAPILLARY: 162 mg/dL — AB (ref 65–99)
Glucose-Capillary: 111 mg/dL — ABNORMAL HIGH (ref 65–99)
Glucose-Capillary: 109 mg/dL — ABNORMAL HIGH (ref 65–99)

## 2017-09-21 LAB — AEROBIC/ANAEROBIC CULTURE W GRAM STAIN (SURGICAL/DEEP WOUND)

## 2017-09-21 LAB — MAGNESIUM: Magnesium: 1.9 mg/dL (ref 1.7–2.4)

## 2017-09-21 LAB — AEROBIC/ANAEROBIC CULTURE (SURGICAL/DEEP WOUND)

## 2017-09-21 MED ORDER — GERHARDT'S BUTT CREAM
TOPICAL_CREAM | Freq: Four times a day (QID) | CUTANEOUS | Status: DC
  Administered 2017-09-21 – 2017-09-22 (×2): via TOPICAL
  Administered 2017-09-22: 1 via TOPICAL
  Administered 2017-09-22 – 2017-09-23 (×3): via TOPICAL
  Administered 2017-09-23: 1 via TOPICAL
  Administered 2017-09-24 (×2): via TOPICAL
  Administered 2017-09-24: 1 via TOPICAL
  Administered 2017-09-25: 12:00:00 via TOPICAL
  Filled 2017-09-21: qty 1

## 2017-09-21 NOTE — Progress Notes (Addendum)
CSW met with patient at bedside to attempt to discuss discharge planning to SNF.  Patient reports she is still having pain, but reports improvements with her pressure wound.  She wants to continue to pursue [facility] SNF, understanding bed availability is unknown.   CSW spoke with Whitney at [facility], there are no beds available. Patient is still on the waiting list.  CSW will continue to make self available to patient.   Kathrin Greathouse, Latanya Presser, MSW Clinical Social Worker  [phone]

## 2017-09-21 NOTE — Progress Notes (Signed)
Daily Progress Note   Patient Name: [name]       Date: 09/21/2017 DOB: 05/17/1956  Age: 62 y.o. MRN#: [id number] Attending Physician: Roxan Hockey, MD Primary Care Physician: Carollee Herter, Alferd Apa, DO Admit Date: 08/29/2017  Reason for Consultation/Follow-up: Establishing goals of care, Non pain symptom management, Pain control and Psychosocial/spiritual support  Subjective: Patient is sitting up in bed brushing teeth. Sister-n-law is at the bedside. Patient reports she feels somewhat better today, however is still having pain. Does agree that current regimen is working (Dilaudid 6mg  PO PRN, Fentanyl 93mcg patch, and fentanyl IV PRN). Over the last 24 hours she has not needed to use any IV fentanyl as pain is controlled on PO Dilaudid. She reports trying to "wean" herself from the pain medication as she knows she can't be on it the rest of her life. Also states, she is not aware of what the next steps are in her care plan.   Her sister as well patient reports her appetite is improving.   Chart is reviewed and discussed with bedside RN and Dr. Joesph Fillers.  Length of Stay: 23  Current Medications: Scheduled Meds:  . acetaminophen  1,000 mg Oral TID WC & HS  . anastrozole  1 mg Oral Daily  . artificial tears   Both Eyes QHS  . cholecalciferol  10,000 Units Oral Weekly  . ciprofloxacin  500 mg Oral Daily  . enoxaparin (LOVENOX) injection  30 mg Subcutaneous Q24H  . famotidine  20 mg Oral Daily  . fentaNYL  75 mcg Transdermal Q72H  . ferrous sulfate  325 mg Oral QHS  . insulin aspart  0-20 Units Subcutaneous TID WC  . insulin aspart  0-5 Units Subcutaneous QHS  . insulin aspart  7 Units Subcutaneous TID WC  . insulin glargine  25 Units Subcutaneous BID  . levothyroxine  150 mcg Oral  QAC breakfast  . lidocaine  1 patch Transdermal Q24H  . linagliptin  5 mg Oral Daily  . lip balm  1 application Topical BID  . polyvinyl alcohol  1 drop Both Eyes QHS  . pregabalin  75 mg Oral BID  . prenatal multivitamin  1 tablet Oral Daily  . psyllium  1 packet Oral BID  . rosuvastatin  10 mg Oral QPM  . sertraline  100 mg  Oral Daily  . sodium chloride flush  5 mL Intracatheter Q8H  . Tbo-Filgrastim  480 mcg Subcutaneous Weekly    Continuous Infusions:   PRN Meds: ALPRAZolam, alum & mag hydroxide-simeth, diphenhydrAMINE, fentaNYL (SUBLIMAZE) injection, guaiFENesin-dextromethorphan, HYDROmorphone, loperamide, methocarbamol, metoCLOPramide, prochlorperazine, protein supplement shake, sodium chloride flush, traZODone  Physical Exam    Constitutional: She is oriented to person, place, and time. Vital signs are normal.  Abdominal: There is tenderness.  All drains intact   Musculoskeletal:  Generalized weakness      Vital Signs: BP (!) 122/46 (BP Location: Right Arm)   Pulse 76   Temp 99 F (37.2 C) (Oral)   Resp 18   Ht 5\' 2"  (1.575 m)   Wt 95.3 kg (210 lb 1.6 oz)   SpO2 100%   BMI 38.43 kg/m  SpO2: SpO2: 100 % O2 Device: O2 Device: Not Delivered O2 Flow Rate: O2 Flow Rate (L/min): 2 L/min  Intake/output summary:   Intake/Output Summary (Last 24 hours) at 09/21/2017 1521 Last data filed at 09/21/2017 1400 Gross per 24 hour  Intake 815 ml  Output 3277 ml  Net -2462 ml   LBM: Last BM Date: 09/21/17 Baseline Weight: Weight: 82.9 kg (182 lb 12.2 oz) Most recent weight: Weight: 95.3 kg (210 lb 1.6 oz)       Palliative Assessment/Data: PPS 50%   Flowsheet Rows     Most Recent Value  Intake Tab  Referral Department  Hospitalist  Unit at Time of Referral  Med/Surg Unit  Palliative Care Primary Diagnosis  Cancer  Date Notified  09/12/17  Palliative Care Type  New Palliative care  Reason for referral  Clarify Goals of Care  Date of Admission  08/29/17  Date  first seen by Palliative Care  09/13/17  # of days Palliative referral response time  1 Day(s)  # of days IP prior to Palliative referral  14  Clinical Assessment  Psychosocial & Spiritual Assessment  Palliative Care Outcomes      Patient Active Problem List   Diagnosis Date Noted  . MDD (major depressive disorder), single episode, moderate ([place])   . Poorly controlled diabetes mellitus ([place]) 08/31/2017  . Pressure injury of skin 08/31/2017  . 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 History of external beam radiation therapy to pelvis 08/31/2017  . ARF (acute renal failure) ([place]) 08/30/2017  . Adjustment disorder with mixed anxiety and depressed mood 08/30/2017  . Dehydration 08/29/2017  . Chronic kidney disease (CKD), stage III (moderate) ([place]) 08/24/2017  . Wound of right buttock 08/10/2017  . 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[place]) 06/20/2017  . Protein-calorie malnutrition, moderate ([place]) 04/18/2017  . Postoperative anemia 04/04/2017  . 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 Hot flashes related to aromatase inhibitor therapy 03/18/2017  . 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 Vitamin D insufficiency 08/30/2016  . Breast cancer of upper-inner quadrant of left female breast ([place])   . Pelvic pain in female 03/19/2015  . Chronic anemia 12/30/2014  . UTI (urinary tract infection)   . Pancytopenia, acquired ([place]) 11/26/2014  . Morbid obesity ([place]) 11/20/2014  . 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 Abdominal pain 09/09/2014  . Postmenopausal bleeding 09/05/2014  . History of ovarian & endometrial cancer 07/17/2012  . 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[name]) 05/10/2012  . Chronic neutropenia ([place]) 12/14/2011  .  Primary  hypothyroidism 12/14/2011  . DM type 2 (diabetes mellitus, type 2) ([place]) 12/14/2011  . Benign essential HTN 12/14/2011    Palliative Care Assessment & Plan    Assessment:  Colovesicular fistula   Depression  Hypothyroidism  AKI on CKD stage III  Breast, ovarian and endometrial cancer  Idiopathic neutropenia, anemia   Hyperlipidemia   Gluteal skin breakdown   Recommendations/Plan:  Full Code   Fentanyl 21mcg Patch for long-acting pain relief  Dilaudid 6 mg PO PRN for breakthrough pain  Fentanyl 25-101mcg IV for pain not resolved with PO dilaudid (no use in 24hrs)  WOC to continue assessing and managing wounds and buttocks breakdown  CT A/P 2/21 to reevaluate presacral abscess for possible drain injection prior to removal per Allred, PA (IR)  SW to continue planning and f/u with [name] for possible placement at dispo  Will continue to support providers, patient, and family during hospitalization for symptom management  Patient was upset regarding disposition and under the impression she could be discharged to [facility] on today. She was very tearful today regarding her current health status and future outlook. After detailed discussion and collaboration with Dr. Joesph Fillers patient is more at ease, knowing the plan. She is aware at this point she is not ready for discharge today and that she will be receiving a repeat CT scan to evaluate presacral abscess based on IR recommendations. She is tearful and feels she needs answers now and feels she should be better by now. Advised her that sometimes the best care is not rushed and is a process promoting thoroughness and healing. Slow and steady wins the race. The medical team is doing all that they can given the complexity of her health care needs. She was suggested to continue with pain medications as needed at this point, and although we understand the need not to take it long-term, it has been a hard course getting her on a  comfortable regimen up to this point, and allowing her pain to build up could potentially cause the need for more medications to relieve pain. She also understands that at the time of discharge and medical readiness if [name] (her selected facility) does not have availability that she should consider a plan B. She mentioned at one point considering [facility] however, they only had shared rooms. She is aware this could also be the case at [facility]. Mrs. Savary, verbalized her understanding of the current plan of care and is appreciative of all teams collaborating and providing the best care possible.   Goals of Care and Additional Recommendations:  Limitations on Scope of Treatment: Full Scope Treatment  Code Status:    Code Status Orders  (From admission, onward)        Start     Ordered   08/29/17 1905  Full code  Continuous     08/29/17 1904    Code Status History    Date Active Date Inactive Code Status Order ID Comments User Context   08/26/2017 13:18 08/29/2017 18:31 Full Code [id number]  Michael [name], MD Outpatient   08/25/2017 12:28 08/26/2017 13:18 Full Code [id number]  Michael [name], MD Outpatient   06/20/2017 08:36 08/17/2017 09:58 Full Code [id number]  Michael [name], MD Outpatient   04/16/2017 12:44 04/25/2017 17:05 Full Code [id number]  Excell Seltzer, MD Inpatient   04/02/2017 02:00 04/07/2017 20:54 Full Code [id number]  Michael [name], MD Inpatient   12/30/2014 23:30 01/02/2015 15:38 Full Code [id number]  Rise Patience, MD Inpatient   12/02/2014  16:49 12/12/2014 20:24 Full Code [id number]  Everitt Amber, MD Inpatient   11/19/2014 19:02 11/25/2014 18:43 Full Code [id number]  Michael [name], MD Inpatient    Advance Directive Documentation     Most Recent Value  Type of Advance Directive  Healthcare Power of Attorney, Living will  Pre-existing out of facility DNR order (yellow form or pink MOST form)  No data  "MOST" Form in Place?  No data      Discharge Planning:  SNF for  rehabilitation pending PT and attending provider's recommendations.   Care plan was discussed with patient, sister-n-law, and bedside RN.  Thank you for allowing the Palliative Medicine Team to assist in the care of this patient.   Total Time 35 min Prolonged Time Billed No       Greater than 50%  of this time was spent counseling and coordinating care related to the above assessment and plan.  Alda Lea, AGNP-C Palliative Medicine Team  Phone: [phone] Fax: [phone]  Please contact Palliative Medicine Team phone at [phone] for questions and concerns.   Addendum: Patient seen and examined along with my colleague Ms Cousar NP Agree with note above PMT to continue to support the patient throughout this hospital course, with symptom management as well as with disposition arrangements.  Awake alert S1 S2 Regular Has drains Still has pain in abdomen Has generalized anxiety as well  Non focal  See recommendations above.  Loistine Chance MD [place] [facility] [phone]

## 2017-09-21 NOTE — Progress Notes (Signed)
PROGRESS NOTE  [name]  BOF:[id number] DOB: [birth year] DOA: 08/29/2017   PCP: Ann Held, DO   Brief Narrative:  62 year old with a history of hyperlipidemia, diabetes mellitus type 2, hypothyroidism, idiopathic neutropenia, breast cancer status post bilateral mastectomy, ovarian and endometrial cancer status post colectomy in 2016 and takedown in August 2018 with ileocecal resection" and anal anastomosis and diverting loop ileostomy along with lysis of adhesion, urethral stent exchange with psoas bladder hitch.  CT of her abdomen pelvis showed new colovesicular fistula therefore she was admitted and was also noted to have subacute renal failure.  Nephrology has been following.   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 - s/p bilateral PCN's placed on 09/02/17  - s/p CT guided placement of presacral drain 09/16/17, gram stain with group B strep - pt already placed on Cipro to complete 10 days therapy, today is day #3/10 - continue analgesia as needed  - pt with ongoing discomfort at the right nephrostomy site - per IR, do not recommend replacement of existing right PCN at new access point - pt will need CT A/P without contrast in next 1 to 2  days to re- evaluate pre sacral abscess and possibly drain injection before removal of Lt buttock/pelvic area drain   DM type II with complications of nephropathy- - continue Insulin Glargine 25 U BID - Continue insulin NovoLog with meal coverage 7 units, nighttime coverage insulin - continue oral linagliptin   - reasonable glycemic control   Depression-  tearful at times, continue Lyrica, trazodone and Zoloft   Hypothyroidism- c/n synthroid   Hypomagnesemia and hypokalemia-replace and recheck   AKI on CKD stage III- - at baseline Cr 2.32, creatinine is down to 1.8, avoid nephrotoxic agents  Breast, ovarian and endometrial cancer - continue anastrozole 1 mg  Idiopathic neutropenia, anemia -  Continue weekly Granix - transfused two U PRBC 02/18 and now Hg up appropriately post transfusion  - not clear why WBC is up but will monitor closely   Hyperlipidemia - continue statin   Increased nutrient needs - low albumin  - continue nutritional supplements   Gluteal skin breakdown  - continue barrier cream - WOC consult   Disposition-unable to go to pending [facility] on 09/21/17--- due to ongoing acute medical and surgical concerns  Consultants:   Surgery  Urology  PCT  IR  DVT prophylaxis - Lovenox SQ  Procedures and studies: 08/17/2017 1.Cystoscopy with right retrograde pyelogram. 2. Removal of right ureteral stent.  09/02/2017  1.Bilateral nephrostomy tube placement  09/13/2017 Right nephrostogram 1.Right nephrectomy tube intact, no evidence of leak, distal cath well positioned in tight renal pelvis   08/31/2017 CT abdomen pelvis without contrast 1. Rectovesical fistula 2. Probable blind-ending fistulous tract extending into the presacral space from the rectum adjacent to the orifice of the rectovesical fistula 3. Reflux of contrast material into the right kidney via the right ureter with moderate atrophy of the right kidney. Nonobstructive calculi in the lower pole collecting system of the left kidney measuring up to 11 mm.  08/30/2017 renal ultrasound: 1. Small nonobstructive calculi in the collecting systems of both kidneys. 2. Mild to moderate hydronephrosis.  Subjective: Right flank pain at the site of the right nephrostomy tube, no fevers, no emesis, tearful at times  Objective: Vitals:   09/20/17 1422 09/20/17 2243 09/21/17 0438 09/21/17 1400  BP: (!) 135/57 (!) 140/57 (!) 118/48 (!) 122/46  Pulse: 78 79 76 76  Resp: 18  16 18 18   Temp: 98.7 F (37.1 C) 99.3 F (37.4 C) 97.9 F (36.6 C) 99 F (37.2 C)  TempSrc: Oral Oral Oral Oral  SpO2: 100% 99% 99% 100%  Weight:   95.3 kg (210 lb 1.6 oz)   Height:        Intake/Output Summary (Last 24  hours) at 09/21/2017 1802 Last data filed at 09/21/2017 1400 Gross per 24 hour  Intake 815 ml  Output 3277 ml  Net -2462 ml   Filed Weights   09/19/17 0500 09/20/17 0500 09/21/17 0438  Weight: 88.6 kg (195 lb 5.2 oz) 87.8 kg (193 lb 9 oz) 95.3 kg (210 lb 1.6 oz)   Physical Exam  Gen:- Awake Alert,  In no apparent distress  HEENT:- [place].AT, No sclera icterus Neck-Supple Neck,No JVD,.  Lungs-  CTAB  CV- S1, S2 normal Abd-  +ve B.Sounds, Abd Soft, bilateral nephrostomy tubes noted with urine (lt > Rt), left buttock area percutaneous drain with scant amount of infected fluid Extremity/Skin:- No  edema,    Psych-tearful at times  neuro- no new Focal deficits  GU-Foley with clear urine  Data Reviewed:   CBC: Recent Labs  Lab 09/17/17 0418 09/18/17 0416 09/19/17 0412 09/20/17 0511 09/21/17 0411  WBC 10.9* 8.5 8.8 14.4* 11.8*  HGB 7.6* 7.2* 6.5* 9.1* 8.5*  HCT 25.5* 24.2* 21.9* 29.2* 27.8*  MCV 95.9 96.0 96.1 93.0 94.9  PLT 345 333 303 287 347   Basic Metabolic Panel: Recent Labs  Lab 09/15/17 0345 09/16/17 0401 09/17/17 0418 09/18/17 0416 09/19/17 0412 09/20/17 0511 09/21/17 0411  NA 134* 136 136 136 138 136 139  K 4.1 4.2 4.1 4.0 3.6 4.0 4.1  CL 106 108 107 105 108 109 112*  CO2 20* 19* 20* 21* 21* 19* 19*  GLUCOSE 140* 172* 91 179* 130* 171* 138*  BUN 38* 40* 45* 49* 44* 35* 31*  CREATININE 2.03* 2.13* 2.16* 2.31* 2.18* 1.88* 2.03*  CALCIUM 9.0 8.9 9.5 9.4 8.7* 8.7* 9.0  MG 2.1 1.6* 1.7 1.5* 1.6* 1.5* 1.9  PHOS 4.0 4.3  --   --   --   --   --    Liver Function Tests: Recent Labs  Lab 09/15/17 0345 09/18/17 0416  AST 15 21  ALT 21 21  ALKPHOS 131* 160*  BILITOT 0.3 0.2*  PROT 6.5 6.9  ALBUMIN 1.9* 2.0*   CBG: Recent Labs  Lab 09/20/17 1157 09/20/17 2236 09/21/17 0731 09/21/17 1215 09/21/17 1707  GLUCAP 155* 111* 109* 187* 87    Scheduled Meds: . acetaminophen  1,000 mg Oral TID WC & HS  . anastrozole  1 mg Oral Daily  . artificial tears    Both Eyes QHS  . cholecalciferol  10,000 Units Oral Weekly  . ciprofloxacin  500 mg Oral Daily  . enoxaparin (LOVENOX) injection  30 mg Subcutaneous Q24H  . famotidine  20 mg Oral Daily  . fentaNYL  75 mcg Transdermal Q72H  . ferrous sulfate  325 mg Oral QHS  . insulin aspart  0-20 Units Subcutaneous TID WC  . insulin aspart  0-5 Units Subcutaneous QHS  . insulin aspart  7 Units Subcutaneous TID WC  . insulin glargine  25 Units Subcutaneous BID  . levothyroxine  150 mcg Oral QAC breakfast  . lidocaine  1 patch Transdermal Q24H  . linagliptin  5 mg Oral Daily  . lip balm  1 application Topical BID  . polyvinyl alcohol  1 drop Both Eyes QHS  .  pregabalin  75 mg Oral BID  . prenatal multivitamin  1 tablet Oral Daily  . psyllium  1 packet Oral BID  . rosuvastatin  10 mg Oral QPM  . sertraline  100 mg Oral Daily  . sodium chloride flush  5 mL Intracatheter Q8H  . Tbo-Filgrastim  480 mcg Subcutaneous Weekly   Continuous Infusions:   LOS: 23 days   Roxan Hockey, MD [facility] [phone]  If 7PM-7AM, please contact night-coverage [email or web address] Password [facility] 09/21/2017, 6:02 PM

## 2017-09-21 NOTE — Progress Notes (Signed)
Occupational Therapy Treatment Patient Details Name: [name] MRN: [id number] DOB: 13-Dec-1955 Today's Date: 09/21/2017    History of present illness Pt is a 62 year old female with hx of bilateral mastectomies for breast cancer, chronic neutropenia,  h/o ovarian and endometrial CA, h/o colostomy 2016, w/takedown/loop ileostomy diversion as well as resection of ureteral stricture w/ bladder hitch reimplantation all in 03/2017. Pt had her stent removed 08/17/17 and admitted 08/29/17 for acute renal failure.   OT comments  Pt  Agreed to BUE.  Pt with increased effort and made theraband tighter this day. Pt also emotional about Dcplan  Follow Up Recommendations  SNF          Precautions / Restrictions Precautions Precautions: Fall Precaution Comments: Multiple lines (catheter, bil PCN tubes) Restrictions Weight Bearing Restrictions: No       Mobility Bed Mobility               General bed mobility comments: declined  Transfers                 General transfer comment: declined        ADL either performed or assessed with clinical judgement   ADL Overall ADL's : Needs assistance/impaired                                       General ADL Comments: Pt upset she hadnt had bath this day.  OT offered for pt to do bath but pt wanted to wait on CNA.       Vision Baseline Vision/History: No visual deficits            Cognition Arousal/Alertness: Awake/alert Behavior During Therapy: WFL for tasks assessed/performed Overall Cognitive Status: Within Functional Limits for tasks assessed                                          Exercises Shoulder Exercises Shoulder Flexion: AROM;Strengthening;Both;Theraband;Supine;20 reps Shoulder ABduction: AROM;Both;Strengthening;Theraband;Supine;20 reps Elbow Flexion: AROM;Theraband;Supine;20 reps Elbow Extension: AROM;Strengthening;Both;Theraband;Supine;20 reps Theraband Level (Elbow  Extension): Other (comment)(orange)           Pertinent Vitals/ Pain       Pain Score: 4  Pain Location: R back Pain Intervention(s): Limited activity within patient's tolerance     Prior Functioning/Environment              Frequency  Min 2X/week        Progress Toward Goals  OT Goals(current goals can now be found in the care plan section)  Progress towards OT goals: (pt progressing slowly. pain has been limiting)     Plan         AM-PAC PT "6 Clicks" Daily Activity     Outcome Measure   Help from another person eating meals?: None Help from another person taking care of personal grooming?: None Help from another person toileting, which includes using toliet, bedpan, or urinal?: Total Help from another person bathing (including washing, rinsing, drying)?: A Lot Help from another person to put on and taking off regular upper body clothing?: A Little Help from another person to put on and taking off regular lower body clothing?: Total 6 Click Score: 15       Activity Tolerance     Patient Left  in bed with call bell with  in reach and visitor present           Time: [phone] OT Time Calculation (min): 26 min  Charges: OT General Charges $OT Visit: 1 Visit OT Treatments $Therapeutic Exercise: 23-37 mins  [name], [name]   [name] 09/21/2017, 3:46 PM

## 2017-09-21 NOTE — Progress Notes (Signed)
   09/21/17 1359  Clinical Encounter Type  Visited With Patient;Health care provider  Visit Type Follow-up  Spiritual Encounters  Spiritual Needs Emotional;Prayer  Stress Factors  Patient Stress Factors (Wants to go to Rehab, but does not feel ready)   Following up with previous visits.  Patient indicated she wants to feel better/get better and go to rehab, but feels like 2 steps forward and 1 step back.  Seems concerned about when she will go to rehab.  She has good family support. She is feeling down today and just wants some sense of when she will feel better.   Will follow and support as needed. Chaplain Katherene Ponto

## 2017-09-22 ENCOUNTER — Encounter (HOSPITAL_COMMUNITY): Payer: Self-pay | Admitting: Radiology

## 2017-09-22 ENCOUNTER — Inpatient Hospital Stay (HOSPITAL_COMMUNITY): Payer: BLUE CROSS/BLUE SHIELD

## 2017-09-22 LAB — CREATININE, SERUM
CREATININE: 1.97 mg/dL — AB (ref 0.44–1.00)
GFR calc Af Amer: 30 mL/min — ABNORMAL LOW (ref >60)
GFR calc non Af Amer: 26 mL/min — ABNORMAL LOW (ref >60)

## 2017-09-22 LAB — GLUCOSE, CAPILLARY
GLUCOSE-CAPILLARY: 110 mg/dL — AB (ref 65–99)
GLUCOSE-CAPILLARY: 175 mg/dL — AB (ref 65–99)
Glucose-Capillary: 151 mg/dL — ABNORMAL HIGH (ref 65–99)
Glucose-Capillary: 122 mg/dL — ABNORMAL HIGH (ref 65–99)

## 2017-09-22 MED ORDER — TBO-FILGRASTIM 480 MCG/0.8ML ~~LOC~~ SOSY
480.0000 ug | PREFILLED_SYRINGE | SUBCUTANEOUS | Status: DC
  Administered 2017-09-22: 480 ug via SUBCUTANEOUS
  Filled 2017-09-22: qty 0.8

## 2017-09-22 NOTE — Progress Notes (Signed)
Physical Therapy Treatment Patient Details Name: [name] MRN: [id number] DOB: 07-20-1956 Today's Date: 09/22/2017    History of Present Illness Pt is a 62 year old female with hx of bilateral mastectomies for breast cancer, chronic neutropenia,  h/o ovarian and endometrial CA, h/o colostomy 2016, w/takedown/loop ileostomy diversion as well as resection of ureteral stricture w/ bladder hitch reimplantation all in 03/2017. Pt had her stent removed 08/17/17 and admitted 08/29/17 for acute renal failure.    PT Comments    Assisted OOB to amb required increased time.  Progressing slowly medically.    Follow Up Recommendations  SNF     Equipment Recommendations  None recommended by PT    Recommendations for Other Services       Precautions / Restrictions Precautions Precautions: Fall Precaution Comments: Multiple lines (catheter, bil PCN tubes) Restrictions Weight Bearing Restrictions: No    Mobility  Bed Mobility Overal bed mobility: Needs Assistance Bed Mobility: Supine to Sit     Supine to sit: Mod assist     General bed mobility comments: increased time  Transfers Overall transfer level: Needs assistance Equipment used: Rolling walker (2 wheeled) Transfers: Sit to/from Stand Sit to Stand: Min guard;Min assist Stand pivot transfers: Min assist;Min guard       General transfer comment: increased time and safety with VC's   Ambulation/Gait Ambulation/Gait assistance: Min guard;Supervision Ambulation Distance (Feet): 500 Feet(full unit) Assistive device: Rolling walker (2 wheeled) Gait Pattern/deviations: Step-through pattern;Decreased stride length;Trunk flexed Gait velocity: decreased    General Gait Details: tolerated amb around full unit with x 5 standing rest breaks.     Stairs            Wheelchair Mobility    Modified Rankin (Stroke Patients Only)       Balance                                            Cognition  Arousal/Alertness: Awake/alert Behavior During Therapy: WFL for tasks assessed/performed Overall Cognitive Status: Within Functional Limits for tasks assessed                                        Exercises      General Comments        Pertinent Vitals/Pain Pain Assessment: Faces Faces Pain Scale: Hurts even more Pain Location: R buttock Pain Descriptors / Indicators: Discomfort;Grimacing Pain Intervention(s): Monitored during session;Repositioned    Home Living                      Prior Function            PT Goals (current goals can now be found in the care plan section) Progress towards PT goals: Progressing toward goals    Frequency    Min 3X/week      PT Plan Current plan remains appropriate    Co-evaluation              AM-PAC PT "6 Clicks" Daily Activity  Outcome Measure  Difficulty turning over in bed (including adjusting bedclothes, sheets and blankets)?: A Lot Difficulty moving from lying on back to sitting on the side of the bed? : A Lot Difficulty sitting down on and standing up from a chair with arms (e.g., wheelchair, bedside  commode, etc,.)?: A Lot Help needed moving to and from a bed to chair (including a wheelchair)?: A Little Help needed walking in hospital room?: A Little Help needed climbing 3-5 steps with a railing? : A Lot 6 Click Score: 14    End of Session Equipment Utilized During Treatment: Gait belt Activity Tolerance: Patient tolerated treatment well Patient left: in chair Nurse Communication: Mobility status PT Visit Diagnosis: Difficulty in walking, not elsewhere classified (R26.2) Pain - Right/Left: Right Pain - part of body: Hip     Time: 0802-2336 PT Time Calculation (min) (ACUTE ONLY): 42 min  Charges:  $Gait Training: 23-37 mins $Therapeutic Activity: 8-22 mins                    G Codes:       Rica Koyanagi  PTA [facility]  Acute  Rehab Pager      [phone]

## 2017-09-22 NOTE — Progress Notes (Signed)
PROGRESS NOTE  [name]  WJX:[id number] DOB: Oct 02, 1955 DOA: 08/29/2017   PCP: Ann Held, DO   Brief Narrative:  62 year old with a history of hyperlipidemia, diabetes mellitus type 2, hypothyroidism, idiopathic neutropenia, breast cancer status post bilateral mastectomy, ovarian and endometrial cancer status post colectomy in 2016 and takedown in August 2018 with ileocecal resection" and anal anastomosis and diverting loop ileostomy along with lysis of adhesion, urethral stent exchange with psoas bladder hitch.  CT of her abdomen pelvis showed new colovesicular fistula therefore she was admitted and was also noted to have subacute renal failure.  Nephrology has been following.   Assessment & Plan:  1)Colovesicular fistula-  - per urology and surgery teams - s/p bilateral PCN's placed on 09/02/17  - - continue analgesia as needed  - pt with ongoing discomfort at the right nephrostomy site - per IR, do not recommend replacement of existing right PCN at new access point - pt will need CT A/P without contrast in next 1 to 2  days to re- evaluate pre sacral abscess and possibly drain injection before removal of Lt buttock/pelvic area drain   2)Presacral Abscess-  s/p CT guided placement of presacral drain 09/16/17, gram stain with group B strep,  pt already placed on Cipro to complete 10 days therapy, today is day #4/10, Repeat CT on 09/22/17 shows pre-sacral drain has retracted out of pelvic cavity and lies within gluteal muscle bed. No definitive residual organized fluid collection. Drain removed at bedside by IR, High concern for recurrence of pelvic abscess given output prior to removal. Will follow clinically.  If rising WBC or fevers, would recommend repeat CT, preferably with contrast if possible, to assess for drainable abscess.  3)DM type II with complications of nephropathy- - continue Insulin Glargine 25 U BID - Continue insulin NovoLog with meal coverage 7 units, nighttime  coverage insulin - continue oral linagliptin   - reasonable glycemic control   Depression-  tearful at times, continue Lyrica, trazodone and Zoloft   Hypothyroidism- c/n synthroid   Hypomagnesemia and hypokalemia-replace and recheck   AKI on CKD stage III- - at baseline Cr 2.32, creatinine is down to 1.8, avoid nephrotoxic agents  Breast, ovarian and endometrial cancer - continue anastrozole 1 mg  Idiopathic neutropenia, anemia - Continue weekly Granix - transfused two U PRBC 02/18 and now Hg up appropriately post transfusion  - not clear why WBC is up but will monitor closely   Hyperlipidemia - continue statin   Increased nutrient needs - low albumin  - continue nutritional supplements   Gluteal skin breakdown  - continue barrier cream - WOC consult   Disposition-unable to go to pending [facility] on 09/21/17--- due to ongoing acute medical and surgical concerns  Consultants:   Surgery  Urology  PCT  IR  DVT prophylaxis - Lovenox SQ  Procedures and studies: 08/17/2017 1.Cystoscopy with right retrograde pyelogram. 2. Removal of right ureteral stent.  09/02/2017  1.Bilateral nephrostomy tube placement  09/13/2017 Right nephrostogram 1.Right nephrectomy tube intact, no evidence of leak, distal cath well positioned in tight renal pelvis   08/31/2017 CT abdomen pelvis without contrast 1. Rectovesical fistula 2. Probable blind-ending fistulous tract extending into the presacral space from the rectum adjacent to the orifice of the rectovesical fistula 3. Reflux of contrast material into the right kidney via the right ureter with moderate atrophy of the right kidney. Nonobstructive calculi in the lower pole collecting system of the left kidney measuring up to 11 mm.  08/30/2017 renal ultrasound: 1. Small nonobstructive calculi in the collecting systems of both kidneys. 2. Mild to moderate hydronephrosis.  Subjective: Very emotional, tearful, pain control is  somewhat better, no fevers  Objective: Vitals:   09/21/17 2217 09/22/17 0500 09/22/17 0600 09/22/17 1400  BP: (!) 127/51  (!) 125/53 118/60  Pulse: 78  78 68  Resp: 18  18 17   Temp: 99.6 F (37.6 C)  98.4 F (36.9 C) 98.6 F (37 C)  TempSrc: Oral  Oral Oral  SpO2: 100%  100% 100%  Weight:  95.4 kg (210 lb 5.1 oz)    Height:        Intake/Output Summary (Last 24 hours) at 09/22/2017 1845 Last data filed at 09/22/2017 1800 Gross per 24 hour  Intake 1060 ml  Output 2530 ml  Net -1470 ml   Filed Weights   09/20/17 0500 09/21/17 0438 09/22/17 0500  Weight: 87.8 kg (193 lb 9 oz) 95.3 kg (210 lb 1.6 oz) 95.4 kg (210 lb 5.1 oz)   Physical Exam  Gen:- Awake Alert,  In no apparent distress  HEENT:- [place].AT, No sclera icterus Neck-Supple Neck,No JVD,.  Lungs-  CTAB  CV- S1, S2 normal Abd-  +ve B.Sounds, Abd Soft, bilateral nephrostomy tubes noted with urine (lt > Rt), left buttock area percutaneous drain removed on 09/22/17 Extremity/Skin:- No  edema,    Psych-tearful at times  neuro- no new Focal deficits  GU-Foley with clear urine  Data Reviewed:   CBC: Recent Labs  Lab 09/17/17 0418 09/18/17 0416 09/19/17 0412 09/20/17 0511 09/21/17 0411  WBC 10.9* 8.5 8.8 14.4* 11.8*  HGB 7.6* 7.2* 6.5* 9.1* 8.5*  HCT 25.5* 24.2* 21.9* 29.2* 27.8*  MCV 95.9 96.0 96.1 93.0 94.9  PLT 345 333 303 287 194   Basic Metabolic Panel: Recent Labs  Lab 09/16/17 0401 09/17/17 0418 09/18/17 0416 09/19/17 0412 09/20/17 0511 09/21/17 0411 09/22/17 0420  NA 136 136 136 138 136 139  --   K 4.2 4.1 4.0 3.6 4.0 4.1  --   CL 108 107 105 108 109 112*  --   CO2 19* 20* 21* 21* 19* 19*  --   GLUCOSE 172* 91 179* 130* 171* 138*  --   BUN 40* 45* 49* 44* 35* 31*  --   CREATININE 2.13* 2.16* 2.31* 2.18* 1.88* 2.03* 1.97*  CALCIUM 8.9 9.5 9.4 8.7* 8.7* 9.0  --   MG 1.6* 1.7 1.5* 1.6* 1.5* 1.9  --   PHOS 4.3  --   --   --   --   --   --    Liver Function Tests: Recent Labs  Lab  09/18/17 0416  AST 21  ALT 21  ALKPHOS 160*  BILITOT 0.2*  PROT 6.9  ALBUMIN 2.0*   CBG: Recent Labs  Lab 09/21/17 1707 09/21/17 2206 09/22/17 0753 09/22/17 1245 09/22/17 1731  GLUCAP 87 162* 110* 175* 151*    Scheduled Meds: . acetaminophen  1,000 mg Oral TID WC & HS  . anastrozole  1 mg Oral Daily  . artificial tears   Both Eyes QHS  . cholecalciferol  10,000 Units Oral Weekly  . ciprofloxacin  500 mg Oral Daily  . enoxaparin (LOVENOX) injection  30 mg Subcutaneous Q24H  . famotidine  20 mg Oral Daily  . fentaNYL  75 mcg Transdermal Q72H  . ferrous sulfate  325 mg Oral QHS  . Gerhardt's butt cream   Topical QID  . insulin aspart  0-20 Units Subcutaneous TID  WC  . insulin aspart  0-5 Units Subcutaneous QHS  . insulin aspart  7 Units Subcutaneous TID WC  . insulin glargine  25 Units Subcutaneous BID  . levothyroxine  150 mcg Oral QAC breakfast  . lidocaine  1 patch Transdermal Q24H  . linagliptin  5 mg Oral Daily  . lip balm  1 application Topical BID  . polyvinyl alcohol  1 drop Both Eyes QHS  . pregabalin  75 mg Oral BID  . prenatal multivitamin  1 tablet Oral Daily  . psyllium  1 packet Oral BID  . rosuvastatin  10 mg Oral QPM  . sertraline  100 mg Oral Daily  . sodium chloride flush  5 mL Intracatheter Q8H  . Tbo-Filgrastim  480 mcg Subcutaneous 6 days   Continuous Infusions:   LOS: 24 days   Roxan Hockey, MD [facility] [phone]  If 7PM-7AM, please contact night-coverage [email or web address] Password [facility] 09/22/2017, 6:45 PM

## 2017-09-22 NOTE — Progress Notes (Signed)
Daily Progress Note   Patient Name: [name]       Date: 09/22/2017 DOB: 1956/04/10  Age: 62 y.o. MRN#: [id number] Attending Physician: Roxan Hockey, MD Primary Care Physician: Carollee Herter, Alferd Apa, DO Admit Date: 08/29/2017  Reason for Consultation/Follow-up: Establishing goals of care, Non pain symptom management, Pain control and Psychosocial/spiritual support  Subjective: Patient sitting up in bed eating lunch. States her appetite is improving. Patient reports she feeling a little better today as well. No family at bedside. Current regimen remains effective in controlling her pain (Dilaudid 6mg  PO PRN, Fentanyl 71mcg patch, and fentanyl IV PRN).   Chart is reviewed and discussed with bedside RN.  Length of Stay: 24  Current Medications: Scheduled Meds:  . acetaminophen  1,000 mg Oral TID WC & HS  . anastrozole  1 mg Oral Daily  . artificial tears   Both Eyes QHS  . cholecalciferol  10,000 Units Oral Weekly  . ciprofloxacin  500 mg Oral Daily  . enoxaparin (LOVENOX) injection  30 mg Subcutaneous Q24H  . famotidine  20 mg Oral Daily  . fentaNYL  75 mcg Transdermal Q72H  . ferrous sulfate  325 mg Oral QHS  . Gerhardt's butt cream   Topical QID  . insulin aspart  0-20 Units Subcutaneous TID WC  . insulin aspart  0-5 Units Subcutaneous QHS  . insulin aspart  7 Units Subcutaneous TID WC  . insulin glargine  25 Units Subcutaneous BID  . levothyroxine  150 mcg Oral QAC breakfast  . lidocaine  1 patch Transdermal Q24H  . linagliptin  5 mg Oral Daily  . lip balm  1 application Topical BID  . polyvinyl alcohol  1 drop Both Eyes QHS  . pregabalin  75 mg Oral BID  . prenatal multivitamin  1 tablet Oral Daily  . psyllium  1 packet Oral BID  . rosuvastatin  10 mg Oral QPM  .  sertraline  100 mg Oral Daily  . sodium chloride flush  5 mL Intracatheter Q8H  . Tbo-Filgrastim  480 mcg Subcutaneous Weekly    Continuous Infusions:   PRN Meds: ALPRAZolam, alum & mag hydroxide-simeth, diphenhydrAMINE, fentaNYL (SUBLIMAZE) injection, guaiFENesin-dextromethorphan, HYDROmorphone, loperamide, methocarbamol, metoCLOPramide, prochlorperazine, protein supplement shake, sodium chloride flush, traZODone  Physical Exam         Constitutional: She  isoriented to person, place, and time.Vital signs are normal.  Abdominal: There istenderness. All drains intact Musculoskeletal: Generalized weakness   Vital Signs: BP (!) 125/53 (BP Location: Right Leg)   Pulse 78   Temp 98.4 F (36.9 C) (Oral)   Resp 18   Ht 5\' 2"  (1.575 m)   Wt 95.4 kg (210 lb 5.1 oz)   SpO2 100%   BMI 38.47 kg/m  SpO2: SpO2: 100 % O2 Device: O2 Device: Not Delivered O2 Flow Rate: O2 Flow Rate (L/min): 2 L/min  Intake/output summary:   Intake/Output Summary (Last 24 hours) at 09/22/2017 1346 Last data filed at 09/22/2017 1301 Gross per 24 hour  Intake 1180 ml  Output 3210 ml  Net -2030 ml   LBM: Last BM Date: 09/22/17 Baseline Weight: Weight: 82.9 kg (182 lb 12.2 oz) Most recent weight: Weight: 95.4 kg (210 lb 5.1 oz)       Palliative Assessment/Data: PPS 50%    Flowsheet Rows     Most Recent Value  Intake Tab  Referral Department  Hospitalist  Unit at Time of Referral  Med/Surg Unit  Palliative Care Primary Diagnosis  Cancer  Date Notified  09/12/17  Palliative Care Type  New Palliative care  Reason for referral  Clarify Goals of Care  Date of Admission  08/29/17  Date first seen by Palliative Care  09/13/17  # of days Palliative referral response time  1 Day(s)  # of days IP prior to Palliative referral  14  Clinical Assessment  Psychosocial & Spiritual Assessment  Palliative Care Outcomes      Patient Active Problem List   Diagnosis Date Noted  . MDD (major  depressive disorder), single episode, moderate ([place])   . Poorly controlled diabetes mellitus ([place]) 08/31/2017  . Pressure injury of skin 08/31/2017  . 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 History of external beam radiation therapy to pelvis 08/31/2017  . ARF (acute renal failure) ([place]) 08/30/2017  . Adjustment disorder with mixed anxiety and depressed mood 08/30/2017  . Dehydration 08/29/2017  . Chronic kidney disease (CKD), stage III (moderate) ([place]) 08/24/2017  . Wound of right buttock 08/10/2017  . 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[place]) 06/20/2017  . Protein-calorie malnutrition, moderate ([place]) 04/18/2017  . Postoperative anemia 04/04/2017  . 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 Hot flashes related to aromatase inhibitor therapy 03/18/2017  . 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 Vitamin D insufficiency 08/30/2016  . Breast cancer of upper-inner quadrant of left female breast ([place])   . Pelvic pain in female 03/19/2015  . Chronic anemia 12/30/2014  . UTI (urinary tract infection)   . Pancytopenia, acquired ([place]) 11/26/2014  . Morbid obesity ([place]) 11/20/2014  . 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 Abdominal pain 09/09/2014  . Postmenopausal bleeding 09/05/2014  . History of ovarian & endometrial cancer 07/17/2012  . 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[place]) 05/10/2012  . Chronic neutropenia ([place]) 12/14/2011  . Primary hypothyroidism 12/14/2011  . DM type 2 (diabetes mellitus, type 2) ([place]) 12/14/2011  . Benign essential HTN 12/14/2011    Palliative Care Assessment & Plan    Assessment:  Colovesicular fistula   Depression  Hypothyroidism  AKI on CKD stage III  Breast, ovarian and endometrial  cancer  Idiopathic neutropenia, anemia   Hyperlipidemia   Gluteal skin breakdown  Recommendations/Plan:  Full Code   Fentanyl 35mcg Patch for long-acting pain relief  Dilaudid 6 mg PO PRN for breakthrough pain (28mg  over past 24hrs)  Fentanyl 25-57mcg IV for pain not resolved with PO dilaudid (no use in 24hrs)  Agree with WOC to continue assessing and managing wounds and buttocks breakdown  CT A/P completed today, Per Dr. Johney Maine outpatient f/u will be needed  Patient concerned with Granix being given every 7 days, her home schedule was every 5-6 days. Will adjust based on Dr. Alvy Bimler recommendations.   SW to continue planning and f/u with [name] for possible placement at dispo  Will continue to support providers, patient, and family during hospitalization for symptom management  Goals of Care and Additional Recommendations:  Limitations on Scope of Treatment: Full Scope Treatment  Code Status:    Code Status Orders  (From admission, onward)        Start     Ordered   08/29/17 1905  Full code  Continuous     08/29/17 1904    Code Status History    Date Active Date Inactive Code Status Order ID Comments User Context   08/26/2017 13:18 08/29/2017 18:31 Full Code [id number]  Michael [name], MD Outpatient   08/25/2017 12:28 08/26/2017 13:18 Full Code [id number]  Michael [name], MD Outpatient   06/20/2017 08:36 08/17/2017 09:58 Full Code [id number]  Michael [name], MD Outpatient   04/16/2017 12:44 04/25/2017 17:05 Full Code [id number]  Excell Seltzer, MD Inpatient   04/02/2017 02:00 04/07/2017 20:54 Full Code [id number]  Michael [name], MD Inpatient   12/30/2014 23:30 01/02/2015 15:38 Full Code [id number]  Rise Patience, MD Inpatient   12/02/2014 16:49 12/12/2014 20:24 Full Code [id number]  Everitt Amber, MD Inpatient   11/19/2014 19:02 11/25/2014 18:43 Full Code [id number]  Michael [name], MD Inpatient    Advance Directive Documentation     Most Recent Value  Type of Advance Directive   Healthcare Power of [place], Living will  Pre-existing out of facility DNR order (yellow form or pink MOST form)  No data  "MOST" Form in Place?  No data       Prognosis:   Unable to determine  Discharge Planning:  SNF for rehabilitation pending PT and attending provider's recommendations.   Care plan was discussed with patient and bedside RN.   Thank you for allowing the Palliative Medicine Team to assist in the care of this patient.   Total Time 61min. Prolonged Time Billed  No      Greater than 50%  of this time was spent counseling and coordinating care related to the above assessment and plan.  Alda Lea, AGNP-C Palliative Medicine Team  Phone: [phone] Fax: [phone]  Please contact Palliative Medicine Team phone at [phone] for questions and concerns.   Addendum Agree with above note Discussed in detail with Ms Cousar NP PMT to continue to follow along in adjusting medications for complex symptoms along with providing supportive care.  Loistine Chance MD [place] [facility] [phone]

## 2017-09-22 NOTE — Progress Notes (Addendum)
[facility]  [address] [place], [place] [address] Phone: [phone]  FAX: [phone]      [name] [id number] 1956/03/08  CARE TEAM:  PCP: Ann Held, DO  Outpatient Care Team: Patient Care Team: Carollee Herter, Alferd Apa, DO as PCP - General (Family Medicine) Heath Lark, MD as Consulting Physician (Hematology and Oncology) Everitt Amber, MD as Consulting Physician (Obstetrics and Gynecology) Michael [name], MD as Consulting Physician (General Surgery) Alexis Frock, MD as Consulting Physician (Urology) Altheimer, Legrand Como, MD as Consulting Physician (Endocrinology) Katy Apo, MD as Consulting Physician (Ophthalmology) Irene Limbo, MD as Consulting Physician (Plastic Surgery) Madelon Lips, MD as Consulting Physician (Nephrology)  Inpatient Treatment Team: Treatment Team: Attending Provider: Roxan Hockey, MD; Consulting Physician: Alexis Frock, MD; Registered Nurse: Illene Regulus, RN; Technician: Reuel Derby, NT; Consulting Physician: Michael [name], MD; Technician: Leda Quail, NT; Technician: Estrellita Ludwig, NT; Rounding Team: Dorthy Cooler Radiology, MD; Technician: Sueanne Margarita, [place]; Consulting Physician: Faythe Dingwall, DO; Technician: Etheleen Sia, [place]; Attending Physician: Theodis Blaze, MD; Registered Nurse: Pennie Rushing, RN; Registered Nurse: Oleta Mouse, RN; Technician: Adrian Saran, McKenzie A, NT; Rounding Team: Joycelyn Das, MD; Physical Therapy Assistant: Diego Cory, PTA; Case Manager: Guadalupe Maple, RN   Problem List:   Principal Problem:   MDD (major depressive disorder), single episode, moderate ([employer] [place]) Active Problems:   History of ovarian & endometrial cancer   Chronic kidney disease (CKD), stage III (moderate) ([place])   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 s/p  resection/partial vaginectomy/ LAR/colostomy 11/19/2014   Ureteral stricture, right, 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) 04/01/2017   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   Protein-calorie malnutrition, moderate ([place])   Primary hypothyroidism   DM type 2 (diabetes mellitus, type 2) ([place])   Morbid obesity ([place])   Pelvic cancer s/p colostomy takedown/loop ileostomy diversion 04/01/2017   Dehydration   ARF (acute renal failure) ([place])   Adjustment disorder with mixed anxiety and depressed mood   Poorly controlled diabetes mellitus ([place])   Pressure injury of skin   History of external beam radiation therapy to pelvis  SURGERY 04/01/2017     POST-OPERATIVE DIAGNOSIS:   Recurrent endometrial cancer status post low anterior resection and partial pelvic exeneration  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   SURGEON:Avi Archuleta Gwynneth Aliment, MD- Primary (Dr Tresa Moore assist) PROCEDURE:  XI ROBOTIC LYSIS OF ADHESIONS x 5 hours XI ROBOTIC COLOSTOMY TAKEDOWN ROBOTIC SEWN COLOANAL ANASTOMOSIS Ileocecal resection with anastomosis Diverting loop ileostomy  SURGEON: Alexis Frock, MD - Primary (Dr Johney Maine assist) PROCEDURE:  Cystoscopy with R retrograde and stent exchange.  Robotic R ureteral reimplant with psoas bladder hitch.  Consults: urology   SURGERY 08/17/2017  DIAGNOSIS:  Right ureteral stricture, status post complex reconstruction.  PROCEDURES: 1. Cystoscopy with right retrograde pyelogram. 2. Removal of right ureteral stent.  PHYSICIAN:  Alexis Frock, MD       FINDINGS: 1. Fusiform bladder shape consistent with prior psoas hitch. 2. Visibly patent ureteral-bladder anastomosis. 3. No evidence of hydronephrosis or narrowing with right retrograde     pyelogram.  09/02/2017  Bilateral nephrostomy tube placement  09/16/2017  CT guided perc drainage of presacral fluid collection.  Cx +Group B strep   Assessment  Delayed  bladder leak turned into colovesical fistula with preferential urinary drainage in decompressed diverted colon limb - diverted urine w bilateral nephrostomy tubes &  foley & pelvic drain  Severe depression anxiety.  Moderate gluteal skin breakdown despite urinary diversion and pelvic fluid drainage.  Becoming sedentary again  Plan:    OK to d/c from surgery standpoint.  I can manage the pelvic presacral drain.  I suspect it needs to stay in a few more weeks to allow the collection to close in any potential fistulization to the coloanal anastomosis. Would do drain study to make sure cavity is gone and no persistent visualization.  I am concerned that she is now complaining about that a lot more so she will not tolerate it much longer.  Ileostomy and gluteal care per outpatient wound ostomy clinic.  Closest one in [place] until [facility] can establish one.    Nephrostomy tubes and Foley per urology.    Depression and anxiety per primary care/psychiatry.  I would more strongly consider outpatient psychotherapy.  Chronic pain per palliative care ideally  Pain control.  Challenged by her complaints of her numerous tubes.  Palliative care following closely.  I agree that a lot of this is exacerbated by paroxysms of severe anxiety and depression amplifying her symptoms.  Fentanyl patch for now given her renal insufficiency.  Still needing IV pain meds.  Lidocaine patch may be helping pain on right back nephrostomy tube site.    Oral Dilaudid dose helps at 4 mg only.  Scheduled Tylenol to QID.  Lyrica for chronic pain / depression - increased from 75 to 168m to 74mBID.  Robaxin PRN.     URINE LEAK PER UROLOGY.  Pain at R back nephrostomy tube site - Xrays w/o fracture.  Nephrostomy tube injection showing no leak or abnormality.  CT scan did not show any obvious malpositioning of the tubes.  Urology and interventional radiology feel like it is in good position I do not want to try and place a new  one - defer to them.  They wish to hold off.  Patient wishes to reconsider.    Gluteal skin breakdown -some increasing breakdown on left inner gluteal region with some superficial necrosis.  I worried she is going to develop a decubitus and she is acting more bedridden again.  I do feel the pelvic drain is helping decrease drainage there and she confessed that it is less, but she is not handling this well.    Colovesical fistula due to bladder breakdown & delayed leak.  Urinary diversion s/p percutaneous nephrostomy tubes to divert urine directly.  Foley to catch any more urine.  Still leaking urine output to anus.  Somewhat improved but fair.  Suspect Ileal conduit imminent   Restart ciprofloxacin was group B in the low pelvic fluid collection.  Would just do a 10-day course since it does not seem to be particularly purulent.  Normally would do penicillin but with her allergy will do the alternative.  She seems to have tolerated Cipro earlier.  Mag low - replaced.  K & Phos OK.  Follow  Zoloft for depression.  Increased.   Psychiatric evaluation done - added trazadone QHS & rec PCP f/u.  Psych f/u PRN  Diabetes control.  Continue lantus Bid & PO.  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 may be getting transfusion since less than 7.  Iron deficiency anemia with borderline hemoglobin.  Given another dose of IV dextran this admission.  Oral iron - QHS.  She had difficulty absorbing and tolerating.  With urine leak better controlled, perhaps she can tolerate better  now  Solid diet.  Bowel regimen.  Output under control w/o massive antidiarrheals.    Ostomy care - not major issues w ileostomy.  Ileostomy pink and skin healthy.  Continue neupogen injections for pancytopenia  VTE prophylaxis: SCDs, etc  Ambulate as tolerated to help recovery    Leak on the left lateral inferiolateral corner of the cystostomy Heineke-Mikulicz and psoas hitch needed for the bladder to reach the  right ureter at that had the chronic severe distal stricture.  I am highly skeptical that a radiated bladder with a delayed leak will spontaneously heal.  She had internal Foley drainage, external surgical drainage, internal stenting drainage for a few months, yet this happened. Therefore, most likely will require surgical repair.  Standard of care usually on this situation is to resect colon & do a new distal anastomosis. Primarily repair the bladder & patch it.  However, there is no way I can redo anastomosis in a radiated pelvis with prior surgeries with prior anterior exoneration, radiation, 7-hour lysis adhesions and prior surgery when she was in better shape.  Only options I can see is to try & primarily patch the colon with OMENTOPEXY, primarily repair the bladder with OMENTOPEXY, and hope it will heal.  I am skeptical that it will.  Most likely we will have to convert to an end colostomy to get the colon away from it and allow the bladder to be patched vs cystectomy & ileal conduit.  I do agree with Dr. Tresa Moore that she is rather malnourished and deconditioned at this point so would like to try and hold off on definitive repair if we can.  We will discuss that later once she is in better shape to actually tolerate a reoperation should it come to that (very likely).  Strongly suspect she will need completion pelvic exoneration with cystectomy and ileal conduit and takedown of coloanal anastomosis and permanent colostomy given poor healing of bladder & chronic pelvic collection.   Home health nursing seems to have failed.  Lack of outpatient ostomy clinic in town here making outpatient management challenging.   I strongly suspect she will need a SNF with rehab.  Patient more motivated to get up and move around.  She is still embarrassed in frustrated with persistent urine leak out anus.  But she is getting up some.  Work to try and transition to outpatient rehab with SNF soon.  Needs better pain  control     40 minutes spent in review, evaluation, examination, counseling, and coordination of care.  More than 50% of that time was spent in counseling.  [name], M.D., F.A.C.S. Gastrointestinal and Minimally Invasive Surgery [facility] [address] [place], [place] [address] [phone] Main / Paging    09/22/2017    Subjective: (Chief complaint)  More gluteal pain now >> R back pain.  Crying   Objective:  Vital signs:  Vitals:   09/21/17 1400 09/21/17 2217 09/22/17 0500 09/22/17 0600  BP: (!) 122/46 (!) 127/51  (!) 125/53  Pulse: 76 78  78  Resp: 18 18  18   Temp: 99 F (37.2 C) 99.6 F (37.6 C)  98.4 F (36.9 C)  TempSrc: Oral Oral  Oral  SpO2: 100% 100%  100%  Weight:   95.4 kg (210 lb 5.1 oz)   Height:        Last BM Date: 09/22/17  Intake/Output   Yesterday:  02/20 0701 - 02/21 0700 In: 774 [P.O.:930] Out:  3405 [SNKNL:9767; Drains:30; HALPF:7902] This shift:  No intake/output data recorded.  Bowel function:  Flatus: YES   BM:  YES.  Thick dark brown paste consistency succus in ileostomy bag.  Drain: No drains.     Physical Exam:  General: Pt awake/alert/oriented x4 in no acute distress. Eyes: PERRL, normal EOM.  Sclera clear.  No icterus Neuro: CN II-XII intact w/o focal sensory/motor deficits. Lymph: No head/neck/groin lymphadenopathy Psych:  No delerium/psychosis/paranoia.  Crying & tired.  Somewhat consolable HENT: Normocephalic, Mucus membranes moist.  No thrush Neck: Supple, No tracheal deviation Chest: No chest wall pain w good excursion CV:  Pulses intact.  Regular rhythm MS: Normal AROM mjr joints.  No obvious deformity.  Bilateral perc nephrostomy bags - R posterior sharp pain at skin site but no cellultitis  Abdomen: Soft.  Nondistended.  No tenderness to palpation.  No evidence of peritonitis.  No incarcerated hernias.  No wounds.  Ileostomy pink.  Skin clean around ileostomy.       GU.  No purulent drainage or hematoma.  Foley in place with clear light yellow urine  Rectal.  Gluteal skin breakdown superficial with some L inner skin necrosis - no necrosis.  Barrier cream in place  Ext:   No deformity.  No mjr edema.  No cyanosis Skin: No petechiae / purpura  Results:   Labs: Results for orders placed or performed during the hospital encounter of 08/29/17 (from the past 48 hour(s))  Glucose, capillary     Status: Abnormal   Collection Time: 09/20/17 11:57 AM  Result Value Ref Range   Glucose-Capillary 155 (H) 65 - 99 mg/dL  Glucose, capillary     Status: Abnormal   Collection Time: 09/20/17 10:36 PM  Result Value Ref Range   Glucose-Capillary 111 (H) 65 - 99 mg/dL  CBC     Status: Abnormal   Collection Time: 09/21/17  4:11 AM  Result Value Ref Range   WBC 11.8 (H) 4.0 - 10.5 K/uL   RBC 2.93 (L) 3.87 - 5.11 MIL/uL   Hemoglobin 8.5 (L) 12.0 - 15.0 g/dL   HCT 27.8 (L) 36.0 - 46.0 %   MCV 94.9 78.0 - 100.0 fL   MCH 29.0 26.0 - 34.0 pg   MCHC 30.6 30.0 - 36.0 g/dL   RDW 17.4 (H) 11.5 - 15.5 %   Platelets 264 150 - 400 K/uL    Comment: Performed at [facility], [place] [address], [place], [place] [address]  Basic metabolic panel     Status: Abnormal   Collection Time: 09/21/17  4:11 AM  Result Value Ref Range   Sodium 139 135 - 145 mmol/L   Potassium 4.1 3.5 - 5.1 mmol/L   Chloride 112 (H) 101 - 111 mmol/L   CO2 19 (L) 22 - 32 mmol/L   Glucose, Bld 138 (H) 65 - 99 mg/dL   BUN 31 (H) 6 - 20 mg/dL   Creatinine, Ser 2.03 (H) 0.44 - 1.00 mg/dL   Calcium 9.0 8.9 - 10.3 mg/dL   GFR calc non Af Amer 25 (L) >60 mL/min   GFR calc Af Amer 29 (L) >60 mL/min    Comment: (NOTE) The eGFR has been calculated using the CKD EPI equation. This calculation has not been validated in all clinical situations. eGFR's persistently <60 mL/min signify possible Chronic Kidney Disease.    Anion gap 8 5 - 15    Comment: Performed at [facility], [address] [place], [place]  [address]  Magnesium     Status: None   Collection Time: 09/21/17  4:11 AM  Result Value Ref Range   Magnesium 1.9 1.7 - 2.4 mg/dL    Comment: Performed at [facility], [place] [address] [place], [place] [address]  Glucose, capillary     Status: Abnormal   Collection Time: 09/21/17  7:31 AM  Result Value Ref Range   Glucose-Capillary 109 (H) 65 - 99 mg/dL  Glucose, capillary     Status: Abnormal   Collection Time: 09/21/17 12:15 PM  Result Value Ref Range   Glucose-Capillary 187 (H) 65 - 99 mg/dL  Glucose, capillary     Status: None   Collection Time: 09/21/17  5:07 PM  Result Value Ref Range   Glucose-Capillary 87 65 - 99 mg/dL  Glucose, capillary     Status: Abnormal   Collection Time: 09/21/17 10:06 PM  Result Value Ref Range   Glucose-Capillary 162 (H) 65 - 99 mg/dL  Creatinine, serum     Status: Abnormal   Collection Time: 09/22/17  4:20 AM  Result Value Ref Range   Creatinine, Ser 1.97 (H) 0.44 - 1.00 mg/dL   GFR calc non Af Amer 26 (L) >60 mL/min   GFR calc Af Amer 30 (L) >60 mL/min    Comment: (NOTE) The eGFR has been calculated using the CKD EPI equation. This calculation has not been validated in all clinical situations. eGFR's persistently <60 mL/min signify possible Chronic Kidney Disease. Performed at [facility], [place] [address], [place], [place] [address]   Glucose, capillary     Status: Abnormal   Collection Time: 09/22/17  7:53 AM  Result Value Ref Range   Glucose-Capillary 110 (H) 65 - 99 mg/dL    Imaging / Studies: No results found.  Medications / Allergies: per chart  Antibiotics: Anti-infectives (From admission, onward)   Start     Dose/Rate Route Frequency Ordered Stop   09/19/17 1000  ciprofloxacin (CIPRO) tablet 500 mg     500 mg Oral Daily 09/19/17 0756     08/30/17 1000  ciprofloxacin (CIPRO) IVPB 200 mg  Status:  Discontinued     200 mg 100 mL/hr over 60  Minutes Intravenous Every 24 hours 08/30/17 0855 09/06/17 0805        Note: Portions of this report may have been transcribed using voice recognition software. Every effort was made to ensure accuracy; however, inadvertent computerized transcription errors may be present.   Any transcriptional errors that result from this process are unintentional.     [name], M.D., F.A.C.S. Gastrointestinal and Minimally Invasive Surgery [facility] [address] [place], [place] [address] [phone] Main / Paging    09/22/2017

## 2017-09-22 NOTE — Progress Notes (Signed)
Referring Physician(s): Manny,T/TGross,S  Supervising Physician: Daryll Brod  Patient Status:  [facility] - In-pt  Chief Complaint: Bladder leak, presacral abscess   Subjective: Pt currently lying in bed, playing cards; cont to have some rt flank pain and soreness at TG drain site; Repeat CT performed today Reviewed with Dr. Barbie Banner   Allergies: Penicillins; Adhesive [tape]; Cefaclor; Erythromycin; Trimethoprim; Ultram [tramadol]; Ciprofloxacin; Fluconazole; Oxycodone; Pectin; and Sulfa antibiotics  Medications:  Current Facility-Administered Medications:  .  acetaminophen (TYLENOL) tablet 1,000 mg, 1,000 mg, Oral, TID WC & HS, Michael [name], MD, 1,000 mg at 09/22/17 1250 .  ALPRAZolam (XANAX) tablet 0.5 mg, 0.5 mg, Oral, TID PRN, Theodis Blaze, MD .  alum & mag hydroxide-simeth (MAALOX/MYLANTA) 200-200-20 MG/5ML suspension 30 mL, 30 mL, Oral, Q6H PRN, Michael [name], MD, 30 mL at 09/17/17 0047 .  anastrozole (ARIMIDEX) tablet 1 mg, 1 mg, Oral, Daily, Michael [name], MD, 1 mg at 09/22/17 5701 .  artificial tears (LACRILUBE) ophthalmic ointment, , Both Eyes, QHS, Michael [name], MD, 1 application at 77/93/90 2239 .  cholecalciferol (VITAMIN D) tablet 10,000 Units, 10,000 Units, Oral, Weekly, Michael [name], MD, 10,000 Units at 09/04/17 1650 .  ciprofloxacin (CIPRO) tablet 500 mg, 500 mg, Oral, Daily, Michael [name], MD, 500 mg at 09/22/17 3009 .  diphenhydrAMINE (BENADRYL) capsule 25 mg, 25 mg, Oral, Q6H PRN, Theodis Blaze, MD .  enoxaparin (LOVENOX) injection 30 mg, 30 mg, Subcutaneous, Q24H, Allred, Darrell K, PA-C, 30 mg at 09/21/17 2239 .  famotidine (PEPCID) tablet 20 mg, 20 mg, Oral, Daily, Michael [name], MD, 20 mg at 09/22/17 2330 .  fentaNYL (DURAGESIC - dosed mcg/hr) 75 mcg, 75 mcg, Transdermal, Q72H, Micheline Rough, MD, 75 mcg at 09/22/17 1310 .  fentaNYL (SUBLIMAZE) injection 25-50 mcg, 25-50 mcg, Intravenous, Q3H PRN, Domingo Cocking, Gene, MD .  ferrous sulfate tablet 325 mg, 325 mg,  Oral, QHS, Michael [name], MD, 325 mg at 09/21/17 2240 .  Gerhardt's butt cream, , Topical, QID, Emokpae, Courage, MD .  guaiFENesin-dextromethorphan (ROBITUSSIN DM) 100-10 MG/5ML syrup 10 mL, 10 mL, Oral, Q4H PRN, Michael [name], MD, 10 mL at 09/01/17 2135 .  HYDROmorphone (DILAUDID) tablet 6 mg, 6 mg, Oral, Q3H PRN, Micheline Rough, MD, 6 mg at 09/22/17 1249 .  insulin aspart (novoLOG) injection 0-20 Units, 0-20 Units, Subcutaneous, TID WC, Patrecia Pour, MD, 4 Units at 09/22/17 1300 .  insulin aspart (novoLOG) injection 0-5 Units, 0-5 Units, Subcutaneous, QHS, Patrecia Pour, MD, 2 Units at 09/18/17 2225 .  insulin aspart (novoLOG) injection 7 Units, 7 Units, Subcutaneous, TID WC, Patrecia Pour, MD, 7 Units at 09/22/17 1300 .  insulin glargine (LANTUS) injection 25 Units, 25 Units, Subcutaneous, BID, Patrecia Pour, MD, 25 Units at 09/22/17 0919 .  levothyroxine (SYNTHROID, LEVOTHROID) tablet 150 mcg, 150 mcg, Oral, QAC breakfast, Michael [name], MD, 150 mcg at 09/22/17 0918 .  lidocaine (LIDODERM) 5 % 1 patch, 1 patch, Transdermal, Q24H, Michael [name], MD, 1 patch at 09/22/17 [phone] .  linagliptin (TRADJENTA) tablet 5 mg, 5 mg, Oral, Daily, Michael [name], MD, 5 mg at 09/22/17 2633 .  lip balm (CARMEX) ointment 1 application, 1 application, Topical, BID, Michael [name], MD, 1 application at 35/45/62 [phone]  loperamide (IMODIUM) capsule 2-4 mg, 2-4 mg, Oral, Q8H PRN, Michael [name], MD .  methocarbamol (ROBAXIN) tablet 1,000 mg, 1,000 mg, Oral, Q6H PRN, Michael [name], MD, 1,000 mg at 09/21/17 1755 .  metoCLOPramide (REGLAN) tablet 5 mg, 5 mg, Oral, Q6H PRN, Michael [name],  MD, 5 mg at 09/16/17 2218 .  polyvinyl alcohol (LIQUIFILM TEARS) 1.4 % ophthalmic solution 1 drop, 1 drop, Both Eyes, QHS, Michael [name], MD, 1 drop at 09/21/17 2242 .  pregabalin (LYRICA) capsule 75 mg, 75 mg, Oral, BID, Micheline Rough, MD, 75 mg at 09/22/17 5784 .  prenatal multivitamin tablet 1 tablet, 1 tablet, Oral, Daily, Michael [name], MD, 1 tablet at 09/22/17 [phone] .  prochlorperazine (COMPAZINE) injection 10 mg, 10 mg, Intravenous, Q4H PRN, Barton Dubois, MD, 10 mg at 09/15/17 2011 .  protein supplement (PREMIER PROTEIN) liquid, 11 oz, Oral, BID PRN, Theodis Blaze, MD .  psyllium (HYDROCIL/METAMUCIL) packet 1 packet, 1 packet, Oral, BID, Michael [name], MD, 1 packet at 09/17/17 [phone] .  rosuvastatin (CRESTOR) tablet 10 mg, 10 mg, Oral, QPM, Michael [name], MD, 10 mg at 09/21/17 1755 .  sertraline (ZOLOFT) tablet 100 mg, 100 mg, Oral, Daily, Micheline Rough, MD, 100 mg at 09/22/17 0918 .  sodium chloride flush (NS) 0.9 % injection 10-40 mL, 10-40 mL, Intracatheter, PRN, Michael [name], MD, 10 mL at 09/21/17 4132 .  sodium chloride flush (NS) 0.9 % injection 5 mL, 5 mL, Intracatheter, Q8H, Wagner, Jaime, DO, 5 mL at 09/22/17 1301 .  Tbo-Filgrastim (GRANIX) injection 480 mcg, 480 mcg, Subcutaneous, Weekly, Amin, Ankit Chirag, MD, 480 mcg at 09/16/17 1748 .  traZODone (DESYREL) tablet 50 mg, 50 mg, Oral, QHS PRN, Amin, Ankit Chirag, MD, 50 mg at 09/19/17 2238    Vital Signs: BP 118/60 (BP Location: Right Leg)   Pulse 68   Temp 98.6 F (37 C) (Oral)   Resp 17   Ht 5' 2"  (1.575 m)   Wt 210 lb 5.1 oz (95.4 kg)   SpO2 100%   BMI 38.47 kg/m   Physical Exam right and left PCN's and TG drains intact,  TG drain still with purulent/feculent material/smell  Imaging: Ct Abdomen Pelvis Wo Contrast  Result Date: 09/22/2017 CLINICAL DATA:  Status post presacral abscess drainage and bilateral nephrostomies. Generalized abdominal pain. EXAM: CT ABDOMEN AND PELVIS WITHOUT CONTRAST TECHNIQUE: Multidetector CT imaging of the abdomen and pelvis was performed following the standard protocol without IV contrast. COMPARISON:  CT scan of September 15, 2017 and September 16, 2017. FINDINGS: Lower chest: No acute abnormality. Hepatobiliary: No focal liver abnormality is seen. Status post cholecystectomy. No biliary dilatation. Pancreas:  Unremarkable. No pancreatic ductal dilatation or surrounding inflammatory changes. Spleen: Normal in size without focal abnormality. Adrenals/Urinary Tract: Stable left adrenal nodule is noted. Right adrenal gland appears normal. Bilateral nephrostomy tubes are noted in grossly good position. No hydronephrosis or renal obstruction is noted. Urinary bladder is decompressed secondary to Foley catheter. Stomach/Bowel: The stomach appears normal. 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 No abnormal bowel dilatation is noted. Vascular/Lymphatic: No significant vascular findings are present. No enlarged abdominal or pelvic lymph nodes. Reproductive: Status post hysterectomy. No adnexal masses. Other: The presacral fluid collection noted on prior exam may be smaller compared to prior exam, although is difficult to assess due to the lack of intravenous contrast. The drainage catheter placed previously has withdrawn significantly, with the distal tip of the catheter now present within the left gluteal musculature outside of the pelvis. Musculoskeletal: No acute or significant osseous findings. IMPRESSION: The pelvic drainage catheter placed 1 week ago has significantly withdrawn, with the distal tip now present within the left gluteal musculature, not the presacral space. The presacral fluid collection noted on prior exam may be smaller currently, but it is  difficult to assess due to lack of intravenous contrast. Stable bilateral nephrostomy catheters are noted without hydronephrosis or obstruction. 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 Probable ileostomy seen in right upper quadrant, and patient is status post almost complete colectomy. Electronically Signed   By: Marijo Conception, M.D.   On: 09/22/2017 11:52    Labs:  CBC: Recent Labs    09/18/17 0416 09/19/17 0412 09/20/17 0511 09/21/17 0411  WBC 8.5 8.8 14.4* 11.8*  HGB 7.2* 6.5* 9.1* 8.5*  HCT 24.2* 21.9* 29.2* 27.8*  PLT 333 303  287 264    COAGS: Recent Labs    04/16/17 0047 09/02/17 0939  INR 1.21 1.21    BMP: Recent Labs    09/18/17 0416 09/19/17 0412 09/20/17 0511 09/21/17 0411 09/22/17 0420  NA 136 138 136 139  --   K 4.0 3.6 4.0 4.1  --   CL 105 108 109 112*  --   CO2 21* 21* 19* 19*  --   GLUCOSE 179* 130* 171* 138*  --   BUN 49* 44* 35* 31*  --   CALCIUM 9.4 8.7* 8.7* 9.0  --   CREATININE 2.31* 2.18* 1.88* 2.03* 1.97*  GFRNONAA 21* 23* 27* 25* 26*  GFRAA 25* 27* 32* 29* 30*    LIVER FUNCTION TESTS: Recent Labs    05/27/17 1724 08/29/17 1550  09/08/17 0355 09/09/17 0457 09/15/17 0345 09/18/17 0416  BILITOT 0.3 0.5  --   --   --  0.3 0.2*  AST 24 16  --   --   --  15 21  ALT 33 24  --   --   --  21 21  ALKPHOS 166* 173*  --   --   --  131* 160*  PROT 9.2* 6.8  --   --   --  6.5 6.9  ALBUMIN 3.9 2.1*   < > 1.8* 1.8* 1.9* 2.0*   < > = values in this interval not displayed.    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 Annamaria Boots do not recommend replacement of existing right PCN at new access point;   Repeat CT today shows pre-sacral drain has retracted out of pelvic cavity and lies within gluteal muscle bed. No definitive residual organized fluid collection. Drain removed at bedside.  High concern for recurrence of pelvic abscess given output prior to removal. Will follow clinically.  If rising WBC or fevers, would recommend repeat CT, preferably with contrast if possible, to assess for drainable abscess.    Electronically Signed: Ascencion Dike, PA-C 09/22/2017, 3:05 PM   I spent a total of 20 minutes at the the patient's bedside AND on the patient's hospital floor or unit, greater than 50% of which was counseling/coordinating care for bilateral nephrostomies/presacral abscess    Patient ID: [name], female   DOB: 1955-12-14, 62 y.o.   MRN:  [id number]

## 2017-09-23 LAB — BASIC METABOLIC PANEL
ANION GAP: 12 (ref 5–15)
BUN: 30 mg/dL — ABNORMAL HIGH (ref 6–20)
CO2: 17 mmol/L — ABNORMAL LOW (ref 22–32)
Calcium: 8.8 mg/dL — ABNORMAL LOW (ref 8.9–10.3)
Chloride: 108 mmol/L (ref 101–111)
Creatinine, Ser: 1.85 mg/dL — ABNORMAL HIGH (ref 0.44–1.00)
GFR calc Af Amer: 32 mL/min — ABNORMAL LOW (ref >60)
GFR calc non Af Amer: 28 mL/min — ABNORMAL LOW (ref >60)
GLUCOSE: 125 mg/dL — AB (ref 65–99)
Potassium: 3.6 mmol/L (ref 3.5–5.1)
Sodium: 137 mmol/L (ref 135–145)

## 2017-09-23 LAB — CBC
HEMATOCRIT: 28.9 % — AB (ref 36.0–46.0)
Hemoglobin: 8.9 g/dL — ABNORMAL LOW (ref 12.0–15.0)
MCH: 29.5 pg (ref 26.0–34.0)
MCHC: 30.8 g/dL (ref 30.0–36.0)
MCV: 95.7 fL (ref 78.0–100.0)
Platelets: 303 10*3/uL (ref 150–400)
RBC: 3.02 MIL/uL — ABNORMAL LOW (ref 3.87–5.11)
RDW: 16.8 % — AB (ref 11.5–15.5)
WBC: 13.1 10*3/uL — AB (ref 4.0–10.5)

## 2017-09-23 LAB — GLUCOSE, CAPILLARY
GLUCOSE-CAPILLARY: 161 mg/dL — AB (ref 65–99)
Glucose-Capillary: 109 mg/dL — ABNORMAL HIGH (ref 65–99)
Glucose-Capillary: 133 mg/dL — ABNORMAL HIGH (ref 65–99)
Glucose-Capillary: 137 mg/dL — ABNORMAL HIGH (ref 65–99)

## 2017-09-23 MED ORDER — DULOXETINE HCL 30 MG PO CPEP
30.0000 mg | ORAL_CAPSULE | Freq: Two times a day (BID) | ORAL | Status: DC
  Administered 2017-09-23 – 2017-09-25 (×5): 30 mg via ORAL
  Filled 2017-09-23 (×5): qty 1

## 2017-09-23 MED ORDER — SERTRALINE HCL 50 MG PO TABS
75.0000 mg | ORAL_TABLET | Freq: Every day | ORAL | Status: DC
  Administered 2017-09-24 – 2017-09-25 (×2): 75 mg via ORAL
  Filled 2017-09-23 (×2): qty 1

## 2017-09-23 MED ORDER — CIPROFLOXACIN HCL 500 MG PO TABS
500.0000 mg | ORAL_TABLET | Freq: Two times a day (BID) | ORAL | Status: DC
  Administered 2017-09-23 – 2017-09-25 (×5): 500 mg via ORAL
  Filled 2017-09-23 (×5): qty 1

## 2017-09-23 NOTE — Progress Notes (Signed)
A team conference was called with patient, husband, sister-in-law, [facility] doctor. LCSW, CM, Higinio Roger, and myself concerning patients plan of care.  Family requested clarification on plan of care.  The plan is for the patient to be discharged 2/23 to [facility] with patient and spouse agreeing to do private pay and husband would follow up with insurance company [employer].  I have reported off to Joe, night [facility] and family is aware to call with any concerns.

## 2017-09-23 NOTE — Progress Notes (Signed)
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 s/p resection/partial vaginectomy/ LAR/colostomy 11/19/2014   Ureteral stricture, right, 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) 04/01/2017   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  Assisted OOB to amb a great distance in hallway using walker for mild gait instability and x 2 standing rest breaks due to R side drain pain.  "deep"  "sharp"  Follow Up Recommendations  SNF(Rehab and medical)     Equipment Recommendations  None recommended by PT    Recommendations for Other Services       Precautions / Restrictions Precautions Precautions: Fall Precaution Comments: Multiple lines (catheter, bil PCN tubes) Restrictions Weight Bearing Restrictions: No    Mobility  Bed Mobility Overal bed mobility: Needs Assistance Bed Mobility: Supine to Sit     Supine to sit: Min assist     General bed mobility comments: HOB elevated and use of rail, pt more able to self perfrom     Transfers Overall transfer level: Needs assistance Equipment used: Rolling walker (2 wheeled);None Transfers: Sit to/from [employer] to Stand: Min guard;Min assist Stand pivot transfers: Min guard;Min assist       General transfer comment: increased time and safety with VC's   Ambulation/Gait Ambulation/Gait assistance: Min guard;Supervision Ambulation Distance (Feet): 500 Feet Assistive device: Rolling walker (2 wheeled) Gait Pattern/deviations: Step-through pattern;Decreased stride length;Trunk flexed Gait velocity: decreased    General Gait Details: tolerated amb around full unit with x 2standing rest breaks.     Stairs            Wheelchair Mobility    Modified Rankin (Stroke Patients Only)       Balance                                            Cognition Arousal/Alertness: Awake/alert Behavior During Therapy: WFL for tasks assessed/performed Overall Cognitive Status: Within Functional Limits for tasks assessed                                 General Comments: pt feeling frustrated with her slow medical progress and annoyed that everyone keeps telling her to eat more.       Exercises      General Comments        Pertinent Vitals/Pain Pain Assessment: 0-10 Faces Pain Scale: Hurts even more Pain Location: R buttock Pain Descriptors /  Indicators: Discomfort;Grimacing Pain Intervention(s): Monitored during session;Repositioned;Patient requesting pain meds-RN notified    Home Living                      Prior Function            PT Goals (current goals can now be found in the care plan section) Progress towards PT goals: Progressing toward goals    Frequency    Min 3X/week      PT Plan Current plan remains appropriate    Co-evaluation              AM-PAC PT "6 Clicks" Daily Activity  Outcome Measure  Difficulty turning over in bed (including adjusting  bedclothes, sheets and blankets)?: A Lot Difficulty moving from lying on back to sitting on the side of the bed? : A Lot Difficulty sitting down on and standing up from a chair with arms (e.g., wheelchair, bedside commode, etc,.)?: A Lot Help needed moving to and from a bed to chair (including a wheelchair)?: A Little Help needed walking in hospital room?: A Little Help needed climbing 3-5 steps with a railing? : A Lot 6 Click Score: 14    End of Session Equipment Utilized During Treatment: Gait belt Activity Tolerance: Patient tolerated treatment well Patient left: in chair Nurse Communication: Mobility status PT Visit Diagnosis: Difficulty in walking, not elsewhere classified (R26.2) Pain - Right/Left: Right     Time: 1200-1230 PT Time Calculation (min) (ACUTE ONLY): 30 min  Charges:  $Gait Training: 8-22 mins $Therapeutic Activity: 8-22 mins                    G Codes:       {Donelda Mailhot  PTA [facility]  Acute  Rehab Pager      [phone]

## 2017-09-23 NOTE — Progress Notes (Signed)
PHARMACY NOTE:  ANTIMICROBIAL RENAL DOSAGE ADJUSTMENT  Current antimicrobial regimen includes a mismatch between antimicrobial dosage and estimated renal function.  As per policy approved by the Pharmacy & Therapeutics and Medical Executive Committees, the antimicrobial dosage will be adjusted accordingly.  Current antimicrobial dosage:  Cipro 500mg  once daily  Indication: presacral abscess  Renal Function:  Estimated Creatinine Clearance: 32.6 mL/min (A) (by C-G formula based on SCr of 1.85 mg/dL (H)). []      On intermittent HD, scheduled: []      On CRRT    Antimicrobial dosage has been changed to:  Cipro 500mg  BID  Additional comments:   Thank you for allowing pharmacy to be a part of this patient's care.  Kara Mead, [facility] 09/23/2017 7:27 AM

## 2017-09-23 NOTE — Progress Notes (Addendum)
PROGRESS NOTE  [name]  XBD:[id number] DOB: 1955/11/11 DOA: 08/29/2017   PCP: Ann Held, DO   Brief Narrative:  62 year old with a history of hyperlipidemia, diabetes mellitus type 2, hypothyroidism, idiopathic neutropenia, breast cancer status post bilateral mastectomy, ovarian and endometrial cancer status post colectomy in 2016 and takedown in August 2018 with ileocecal resection" and anal anastomosis and diverting loop ileostomy along with lysis of adhesion, urethral stent exchange with psoas bladder hitch. Initially admitted 08/29/17.  CT of her abdomen pelvis showed new colovesicular fistula therefore she was admitted and was also noted to have subacute renal failure.  As per Dr Johney Maine Strongly suspect she will need completion pelvic exoneration with cystectomy and ileal conduit and takedown of coloanal anastomosis and permanent colostomy given poor healing of bladder & chronic pelvic collection.     Plan:- 1)Bladder Injury/Right Ureteral Stricture - s/p robotic RIGHT ureteral reimplant with psoas hitch + HM Flap 04/01/17 at time of colostomy take down / adhesiolysis, small bowel resection, loop iliostomy for right distal stricture / bladder injury sustained at pelvic resection for advanced endometrial cancer 2016.Stent removed 08/17/17. s/p bilateral PCN's placed on 09/02/17 ,  pt with ongoing discomfort at the right nephrostomy site, - per IR, do not recommend replacement of existing right PCN at new access point.  FU CT 08/25/17 with resolved right hydro.imaging post-repair favorable, less right hydro than in years which suggests succesfull repair.Her mild left caliectasis is chronic and stable accounting for different imaging modalities. Dr Phebe Colla following  2) Metastatic Endometrial Cancer - s/p repeat resection 11/2014 with colon and vaginal involvement but negative nodes / margins. Had adjuvant chemo-XRT which she is now done with. Follows with Dr. Alvy Bimler.  3) Acute  on Chronic Renal Failure -- due to dehydration, on admission creatinine was around 4,  improved, creatinine around 2,  pt had high output iliostomy,  CT   without hydro.  Renal US  From 08/30/17 with some mild (stable) caliectasis that is stable x months and no right hydro at all. 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   4 - Bacteruria/ Malaise- pt with bacteruria on admit. No fevers / leukocytosis. Most recent culture 07/2017 (when still had foley in place) serratia and enterococcus sens cipro. [name] 1/29 - serratia / pending.   5 - Rectal - Bladder Fistula -   improved diversion away from fistula with Bil neph tubes and foley. All to remain for now as this making her hygeine and care managable.   6)Presacral Abscess-  s/p CT guided placement of presacral drain 09/16/17, gram stain with group B strep,  pt already placed on Cipro to complete 10 days therapy, today is day #5/10, Repeat CT on 09/22/17 shows pre-sacral drain has retracted out of pelvic cavity and lies within gluteal muscle bed. No definitive residual organized fluid collection. Drain removed at bedside by IR, High concern for recurrence of pelvic abscess given output prior to removal. Will follow clinically.  If rising WBC or fevers, would recommend repeat CT, preferably with contrast if possible, to assess for drainable abscess.  7)Generalized Weakness/Debility/deconditioning-discharge to skilled nursing facility for rehab,   8)Follow-up-  will likely need permanent GI and GU diversion when nutritionally replete, this will be months away. As per Dr Johney Maine Strongly suspect she will need completion pelvic exoneration with cystectomy and ileal conduit and takedown of coloanal anastomosis and permanent  colostomy given poor healing of bladder & chronic pelvic collection.  Urology follow-up with Dr. [name] in 2 months  advised  9)Pain Control- fentanyl patch 75 mcg  as ordered, Cymbalta has been added  10)DM type II with complications of nephropathy- - continue Insulin Glargine 25 U BID - Continue insulin NovoLog with meal coverage 7 units, nighttime coverage insulin - continue oral linagliptin   - reasonable glycemic control   11)Depression-  tearful at times, continue Lyrica, trazodone and taper off  Zoloft and titrate up with Cymbalta given pain concerns  12)Breast, ovarian and endometrial cancer- - continue anastrozole 1 mg  13)Hypothyroidism- c/n synthroid   14)Nutritional Marasms-continue to replace electrolytes albumin is low, increase oral intake advised, encourage nutritional supplements   15)Idiopathic neutropenia, anemia-  Continue weekly Granix, leukocytosis post Granix noted,  transfused two U PRBC 09/19/17, monitor hgb   16)Dyslipidemia-continue statin  17)Gluteal skin breakdown- - continue barrier cream, - WOC consult appreciated   18)Dispo-possible discharge to [facility] rehab facility on 09/24/2017  PROCEDURES: 1. Cystoscopy with right retrograde pyelogram. 2. Removal of right ureteral stent. 3) Right ureteral stricture, status post complex Reconstruction. 4) left buttock area percutaneous drain removed on 09/22/17   Procedures and studies: 08/17/2017 1.Cystoscopy with right retrograde pyelogram. 2. Removal of right ureteral stent.  09/02/2017  1.Bilateral nephrostomy tube placement  09/13/2017 Right nephrostogram 1.Right nephrectomy tube intact, no evidence of leak, distal cath well positioned in tight renal pelvis   08/31/2017 CT abdomen pelvis without contrast 1. Rectovesical fistula 2. Probable blind-ending fistulous tract extending into the presacral space from the rectum adjacent to the orifice of the rectovesical fistula 3. Reflux of contrast material into the right kidney via the right ureter with moderate atrophy of the right kidney. Nonobstructive calculi in  the lower pole collecting system of the left kidney measuring up to 11 mm.  08/30/2017 renal ultrasound: 1. Small nonobstructive calculi in the collecting systems of both kidneys. 2. Mild to moderate hydronephrosis.  Consultants:   Surgery  Urology  PCT  IR Wound care/PT  DVT prophylaxis - Lovenox SQ    Subjective: Very emotional, tearful, pain control is somewhat better, T max 100.3 (?? Related to Granix injection), husband and sister-in-law at bedside, case Freight forwarder, Education officer, museum, Retail buyer at bedside, discharge planning discussed, patient's questions answered, overall plan of care discussed with patient and family members including husband and sister-in-law at bedside,  Objective: Vitals:   09/23/17 0500 09/23/17 0508 09/23/17 1000 09/23/17 1400  BP:  (!) 101/51  (!) 167/80  Pulse:  92  80  Resp:  16  17  Temp:  100.3 F (37.9 C) 99.5 F (37.5 C) 99.2 F (37.3 C)  TempSrc:  Oral Oral Oral  SpO2:  98%  100%  Weight: 90.7 kg (199 lb 14.4 oz)     Height:        Intake/Output Summary (Last 24 hours) at 09/23/2017 1653 Last data filed at 09/23/2017 1400 Gross per 24 hour  Intake 600 ml  Output 2865 ml  Net -2265 ml   Filed Weights   09/21/17 0438 09/22/17 0500 09/23/17 0500  Weight: 95.3 kg (210 lb 1.6 oz) 95.4 kg (210 lb 5.1 oz) 90.7 kg (199 lb 14.4 oz)   Physical Exam  Gen:- Awake Alert,  In no apparent distress  HEENT:- .AT, No sclera icterus Neck-Supple Neck,No JVD,.  Lungs-  CTAB  CV- S1, S2 normal Abd-  +ve B.Sounds, Abd Soft, bilateral nephrostomy tubes noted with urine (lt >  Rt), Extremity/Skin:- No  edema,    Psych-tearful at times  neuro- no new Focal deficits  GU-Foley with clear urine  Data Reviewed:   CBC: Recent Labs  Lab 09/18/17 0416 09/19/17 0412 09/20/17 0511 09/21/17 0411 09/23/17 0339  WBC 8.5 8.8 14.4* 11.8* 13.1*  HGB 7.2* 6.5* 9.1* 8.5* 8.9*  HCT 24.2* 21.9* 29.2* 27.8* 28.9*  MCV 96.0 96.1 93.0 94.9 95.7  PLT  333 303 287 264 779   Basic Metabolic Panel: Recent Labs  Lab 09/17/17 0418 09/18/17 0416 09/19/17 0412 09/20/17 0511 09/21/17 0411 09/22/17 0420 09/23/17 0339  NA 136 136 138 136 139  --  137  K 4.1 4.0 3.6 4.0 4.1  --  3.6  CL 107 105 108 109 112*  --  108  CO2 20* 21* 21* 19* 19*  --  17*  GLUCOSE 91 179* 130* 171* 138*  --  125*  BUN 45* 49* 44* 35* 31*  --  30*  CREATININE 2.16* 2.31* 2.18* 1.88* 2.03* 1.97* 1.85*  CALCIUM 9.5 9.4 8.7* 8.7* 9.0  --  8.8*  MG 1.7 1.5* 1.6* 1.5* 1.9  --   --    Liver Function Tests: Recent Labs  Lab 09/18/17 0416  AST 21  ALT 21  ALKPHOS 160*  BILITOT 0.2*  PROT 6.9  ALBUMIN 2.0*   CBG: Recent Labs  Lab 09/22/17 1245 09/22/17 1731 09/22/17 2106 09/23/17 0800 09/23/17 1230  GLUCAP 175* 151* 122* 109* 133*    Scheduled Meds: . acetaminophen  1,000 mg Oral TID WC & HS  . anastrozole  1 mg Oral Daily  . artificial tears   Both Eyes QHS  . cholecalciferol  10,000 Units Oral Weekly  . ciprofloxacin  500 mg Oral BID  . DULoxetine  30 mg Oral BID  . enoxaparin (LOVENOX) injection  30 mg Subcutaneous Q24H  . famotidine  20 mg Oral Daily  . fentaNYL  75 mcg Transdermal Q72H  . ferrous sulfate  325 mg Oral QHS  . Gerhardt's butt cream   Topical QID  . insulin aspart  0-20 Units Subcutaneous TID WC  . insulin aspart  0-5 Units Subcutaneous QHS  . insulin aspart  7 Units Subcutaneous TID WC  . insulin glargine  25 Units Subcutaneous BID  . levothyroxine  150 mcg Oral QAC breakfast  . lidocaine  1 patch Transdermal Q24H  . linagliptin  5 mg Oral Daily  . lip balm  1 application Topical BID  . polyvinyl alcohol  1 drop Both Eyes QHS  . pregabalin  75 mg Oral BID  . prenatal multivitamin  1 tablet Oral Daily  . psyllium  1 packet Oral BID  . rosuvastatin  10 mg Oral QPM  . [START ON 09/24/2017] sertraline  75 mg Oral Daily  . sodium chloride flush  5 mL Intracatheter Q8H  . Tbo-Filgrastim  480 mcg Subcutaneous 6 days    Continuous Infusions:   LOS: 25 days   Roxan Hockey, MD [facility] [phone]  If 7PM-7AM, please contact night-coverage [email or web address] Password [facility] 09/23/2017, 4:53 PM

## 2017-09-23 NOTE — Progress Notes (Signed)
CSW spoke to [facility].  Per [facility], pt was set to D/C on 2/23 to private pay bed at [facility], pt's choice of SNF, but husband is now saying pt needs a pre-auth due to [facility] stating this is needed to file for insurance after D/C. To recompensate pt and her husband.    Of note, per CSW's note today family infomed [employer]BS has no contract with [employer] but family agreeable to plan to D/C private pay anyway.  2nd shift ED CSW updated CSW Asst Director.  CSW Asst Director is aware.  2nd shift ED CSW will leave handoff for 1st shift ED CSW.  Please reconsult if future social work needs arise.  CSW signing off, as social work intervention is no longer needed.  Alphonse Guild. Cherre Kothari, LCSW, LCAS, CSI Clinical Social Worker Ph: [phone]

## 2017-09-23 NOTE — Progress Notes (Signed)
Subjective/Chief Complaint:   1 - Bladder Injury / Right Ureteral Stricture - s/p robotic RIGHT ureteral reimplant with psoas hitch + HM Flap 04/01/17 at time of colostomy take down / adhesiolysis, small bowel resection, loop iliostomy for right distal stricture / bladder injury sustained at pelvic resection for advanced endometrial cancer 2016. Stent removed 08/17/17. FU CT 08/25/17 with resolved right hydro.   2 - Metastatic Endometrial Cancer - s/p repeat resection [phone] with colon and vaginal involvement but negative nodes / margins. Had adjuvant chemo-XRT which she is now done with. Follows with Dr. Alvy Bimler.   3 - Acute on Chronic Renal Failure / Severe Dehydration - pt with high output iliostomy, difficult to maintain hydration, has been on PRN IVF. Cr 4's on admit late 08/2017 up from baseline <1.5. . Her BUN / Cr ratio has been 20 or more since iliostomy. Renal US1/29 with some mild (stable) caliectasis that is stable x months and no right hydro at all.  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 Cr now <2 with neph tubes and foley diverting urine away from fistula.   4 - Bacteruria / Malaise- pt with bacteruria on admit. No fevers / leukocytosis. Most recent clonal CX 07/2017 (when still had foley in place) serratia and enterococcus sens cipro. UCX 1/29 - serratia and treated with few days Cipro. Remains afebrile.   5 - Rectal - Bladder Fistula - some drainage of yellowiosh thin fluid from peri-rectal area with standing per report new late 08/2017. CT cystogram 1/30 confims NEW bladder neck - rectal fistula with large volume urine transit into GI tract distal to loop iliostomy. Out remained high even with foley drainage). Bilat nep tubes placed 09/02/17 with now approx 2/3 urine drianing per neph tuebs and 1/3 per foley and muc less per rectum.   PMH sig for DM2, morbid  obesity, breast cancer, ovarian cancer, endometrial cancer.    Today "[name]" is understandably frustrated. She is ready for DC to SNF but she refuses most facilities that will accept her.    Objective: Vital signs in last 24 hours: Temp:  [99.2 F (37.3 C)-100.3 F (37.9 C)] 99.2 F (37.3 C) (02/22 1400) Pulse Rate:  [72-92] 80 (02/22 1400) Resp:  [16-18] 17 (02/22 1400) BP: (101-167)/(51-80) 167/80 (02/22 1400) SpO2:  [98 %-100 %] 100 % (02/22 1400) Weight:  [90.7 kg (199 lb 14.4 oz)] 90.7 kg (199 lb 14.4 oz) (02/22 0500) Last BM Date: 09/22/17  Intake/Output from previous day: 02/21 0701 - 02/22 0700 In: 605 [P.O.:600; I.V.:5] Out: 2720 [Urine:1775; Drains:5; Stool:940] Intake/Output this shift: Total I/O In: 480 [P.O.:480] Out: 975 [Urine:850; Stool:125]   General appearance: alert and with visible malaise and anxiety, husband at bedside as well.  Eyes: negative Nose: Nares normal. Septum midline. Mucosa normal. No drainage or sinus tenderness. Throat: lips, mucosa, and tongue normal; teeth and gums normal Neck: supple, symmetrical, trachea midline Back: symmetric, no curvature. ROM normal. No CVA tenderness. Bilat neph tubes in placed with clear yellow urine.  GI: stable large truncal obesity. Iliostomy patent of stool. Multiple scars w/o hernias.  Extremities: extremities normal, atraumatic, no cyanosis or edema Skin: Skin color, texture, turgor normal. No rashes or lesions Lymph nodes: Cervical, supraclavicular, and axillary nodes normal. Neurologic: Grossly normal FOley in place with yellow urine mixed with some mucus.    Lab Results:  Recent Labs    09/21/17 0411  09/23/17 0339  WBC 11.8* 13.1*  HGB 8.5* 8.9*  HCT 27.8* 28.9*  PLT 264 303   BMET Recent Labs    09/21/17 0411 09/22/17 0420 09/23/17 0339  NA 139  --  137  K 4.1  --  3.6  CL 112*  --  108  CO2 19*  --  17*  GLUCOSE 138*  --  125*  BUN 31*  --  30*  CREATININE 2.03* 1.97* 1.85*   CALCIUM 9.0  --  8.8*   PT/INR No results for input(s): LABPROT, INR in the last 72 hours. ABG No results for input(s): PHART, HCO3 in the last 72 hours.  Invalid input(s): PCO2, PO2  Studies/Results: Ct Abdomen Pelvis Wo Contrast  Result Date: 09/22/2017 CLINICAL DATA:  Status post presacral abscess drainage and bilateral nephrostomies. Generalized abdominal pain. EXAM: CT ABDOMEN AND PELVIS WITHOUT CONTRAST TECHNIQUE: Multidetector CT imaging of the abdomen and pelvis was performed following the standard protocol without IV contrast. COMPARISON:  CT scan of September 15, 2017 and September 16, 2017. FINDINGS: Lower chest: No acute abnormality. Hepatobiliary: No focal liver abnormality is seen. Status post cholecystectomy. No biliary dilatation. Pancreas: Unremarkable. No pancreatic ductal dilatation or surrounding inflammatory changes. Spleen: Normal in size without focal abnormality. Adrenals/Urinary Tract: Stable left adrenal nodule is noted. Right adrenal gland appears normal. Bilateral nephrostomy tubes are noted in grossly good position. No hydronephrosis or renal obstruction is noted. Urinary bladder is decompressed secondary to Foley catheter. Stomach/Bowel: The stomach appears normal. 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 No abnormal bowel dilatation is noted. Vascular/Lymphatic: No significant vascular findings are present. No enlarged abdominal or pelvic lymph nodes. Reproductive: Status post hysterectomy. No adnexal masses. Other: The presacral fluid collection noted on prior exam may be smaller compared to prior exam, although is difficult to assess due to the lack of intravenous contrast. The drainage catheter placed previously has withdrawn significantly, with the distal tip of the catheter now present within the left gluteal musculature outside of the pelvis. Musculoskeletal: No acute or significant osseous findings. IMPRESSION: The pelvic drainage catheter placed 1 week ago  has significantly withdrawn, with the distal tip now present within the left gluteal musculature, not the presacral space. The presacral fluid collection noted on prior exam may be smaller currently, but it is difficult to assess due to lack of intravenous contrast. Stable bilateral nephrostomy catheters are noted without hydronephrosis or obstruction. 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 Probable ileostomy seen in right upper quadrant, and patient is status post almost complete colectomy. Electronically Signed   By: Marijo Conception, M.D.   On: 09/22/2017 11:52    Anti-infectives: Anti-infectives (From admission, onward)   Start     Dose/Rate Route Frequency Ordered Stop   09/23/17 0800  ciprofloxacin (CIPRO) tablet 500 mg     500 mg Oral 2 times daily 09/23/17 0727     09/19/17 1000  ciprofloxacin (CIPRO) tablet 500 mg  Status:  Discontinued     500 mg Oral Daily 09/19/17 0756 09/23/17 0727   08/30/17 1000  ciprofloxacin (CIPRO) IVPB 200 mg  Status:  Discontinued     200 mg 100 mL/hr over 60 Minutes Intravenous Every 24 hours 08/30/17 0855 09/06/17 0805      Assessment/Plan:  1 - Bladder Injury / Right Ureteral Stricture - imaging post-repair favorable, less right hydro than in years which suggests succesfull repair. Her mild left caliectasis is chronic and stable accounting for different imaging modalities.  2 - Metastatic Endometrial Cancer - no evidence of active disease, per med-oncology.   3 - Acute on Chronic Renal Failure / Severe Dehydration - her acute BUN and Cr rise appears to be mostly pre-renal + exacerbation / artifact from systemic reabsorption via contact with colon via fistula. This is nearly back to baseline with drainage away from fistula.  4 - Bacteruria - now s/p tretment. Observe.   5 - Rectal - Bladder Fistula - improved diversion away from fistula with neph tubes and foley. All to remain for now as this making her hygeine and care managable.    Long term, she will likely need permanent GI and GU diversion when nutritionally replete, this will be months away.   Agree with likely SNF placement given level of deconditioning at anytime.   She has GU FU arranged in 2 mos.   Please call me directly with questions.   [facility], Yoshito Gaza 09/23/2017

## 2017-09-23 NOTE — Progress Notes (Signed)
LCSW following for SNF placement.  Patient and spouse chose bed at [facility].   LCSW confirmed semi private room available tomorrow 2/23. Patient agreed to semi private with option to go on private room wait list.   Patient and spouse agree to private pay. LCSW notified patient and spouse of $257 per day with 30 days up front. Patient and spouse agreeable. Patient has the option to file insurance on her own.   Patients spouse was attempting to reach [place] while LCSW in the room.   PLAN: Patient will dc to [facility] 2/23, private pay.   Carolin Coy [place]

## 2017-09-23 NOTE — Progress Notes (Signed)
LCSW following for SNF placement.   LCSW and RNCM met with patient at bedside. No family present.   LCSW presented patient with bed offers and explained that her commercial BCBS policy has limited contracts with SNFs.   Patient prefers [name]. [name] does not have any female beds today and do not anticipate having any until next week.   Patient is open to [facility]. Facility does not have a a Soil scientist with patients insurance. LCSW provided patient with private pay fees. Patient stated she will need to discuss with her husband.   Patient became tearful and upset about bed offers and the possibility of being discharged. Patient feels that she is not ready to be discharged. LCSW encouraged patient to discuss options with her husband and make a decision to ensure she has a plan when she is discharged. Patient expressed that she feels like she is being pushed out and rushed to make a decision. LCSW acknowledged patients concerns.   Carolin Coy [place]

## 2017-09-24 LAB — GLUCOSE, CAPILLARY
Glucose-Capillary: 130 mg/dL — ABNORMAL HIGH (ref 65–99)
Glucose-Capillary: 168 mg/dL — ABNORMAL HIGH (ref 65–99)
Glucose-Capillary: 118 mg/dL — ABNORMAL HIGH (ref 65–99)
Glucose-Capillary: 128 mg/dL — ABNORMAL HIGH (ref 65–99)

## 2017-09-24 MED ORDER — FAMOTIDINE-CA CARB-MAG HYDROX 10-800-165 MG PO CHEW: 1.0000 | CHEWABLE_TABLET | Freq: Every day | ORAL | 0 refills | Status: DC | PRN

## 2017-09-24 MED ORDER — DIPHENOXYLATE-ATROPINE 2.5-0.025 MG PO TABS: 1.0000 | ORAL_TABLET | Freq: Four times a day (QID) | ORAL | 0 refills | Status: DC | PRN

## 2017-09-24 MED ORDER — METHOCARBAMOL 750 MG PO TABS: 750.0000 mg | ORAL_TABLET | Freq: Three times a day (TID) | ORAL | 1 refills | Status: DC

## 2017-09-24 MED ORDER — HYDROMORPHONE HCL 4 MG PO TABS: 6.0000 mg | ORAL_TABLET | ORAL | 0 refills | Status: DC | PRN

## 2017-09-24 MED ORDER — LOPERAMIDE HCL 2 MG PO CAPS: 2.0000 mg | ORAL_CAPSULE | Freq: Three times a day (TID) | ORAL | 0 refills | Status: DC | PRN

## 2017-09-24 MED ORDER — CIPROFLOXACIN HCL 500 MG PO TABS: 500.0000 mg | ORAL_TABLET | Freq: Two times a day (BID) | ORAL | 0 refills | Status: DC

## 2017-09-24 MED ORDER — TRAZODONE HCL 100 MG PO TABS: 100.0000 mg | ORAL_TABLET | Freq: Every evening | ORAL | 2 refills | Status: DC | PRN

## 2017-09-24 MED ORDER — DIPHENHYDRAMINE HCL 25 MG PO CAPS: 25.0000 mg | ORAL_CAPSULE | Freq: Three times a day (TID) | ORAL | 0 refills | Status: DC | PRN

## 2017-09-24 MED ORDER — ARTIFICIAL TEARS OPHTHALMIC OINT: TOPICAL_OINTMENT | Freq: Every day | OPHTHALMIC | 1 refills | Status: DC

## 2017-09-24 MED ORDER — PREGABALIN 75 MG PO CAPS: 75.0000 mg | ORAL_CAPSULE | Freq: Two times a day (BID) | ORAL | 0 refills | Status: DC

## 2017-09-24 MED ORDER — INSULIN GLARGINE 100 UNIT/ML ~~LOC~~ SOLN: 25.0000 [IU] | Freq: Two times a day (BID) | SUBCUTANEOUS | 11 refills | Status: DC

## 2017-09-24 MED ORDER — FERROUS SULFATE 325 (65 FE) MG PO TABS: 325.0000 mg | ORAL_TABLET | Freq: Every day | ORAL | 3 refills | Status: AC

## 2017-09-24 MED ORDER — INSULIN ASPART 100 UNIT/ML ~~LOC~~ SOLN: 7.0000 [IU] | Freq: Three times a day (TID) | SUBCUTANEOUS | 11 refills | Status: DC

## 2017-09-24 MED ORDER — LIP MEDEX EX OINT: 1.0000 "application " | TOPICAL_OINTMENT | Freq: Two times a day (BID) | CUTANEOUS | 0 refills | Status: DC

## 2017-09-24 MED ORDER — ANASTROZOLE 1 MG PO TABS: 1.0000 mg | ORAL_TABLET | Freq: Every day | ORAL | 3 refills | Status: DC

## 2017-09-24 MED ORDER — PREMIER PROTEIN SHAKE: 11.0000 [oz_av] | Freq: Two times a day (BID) | ORAL | 1 refills | Status: DC | PRN

## 2017-09-24 MED ORDER — DULOXETINE HCL 30 MG PO CPEP: 30.0000 mg | ORAL_CAPSULE | Freq: Two times a day (BID) | ORAL | 3 refills | Status: DC

## 2017-09-24 MED ORDER — INSULIN ASPART 100 UNIT/ML ~~LOC~~ SOLN: 0.0000 [IU] | Freq: Three times a day (TID) | SUBCUTANEOUS | 11 refills | Status: DC

## 2017-09-24 MED ORDER — FENTANYL 75 MCG/HR TD PT72: 75.0000 ug | MEDICATED_PATCH | TRANSDERMAL | 0 refills | Status: DC

## 2017-09-24 MED ORDER — GERHARDT'S BUTT CREAM: 1.0000 "application " | TOPICAL_CREAM | Freq: Four times a day (QID) | CUTANEOUS | 1 refills | Status: DC

## 2017-09-24 MED ORDER — RANITIDINE HCL 150 MG PO TABS: 150.0000 mg | ORAL_TABLET | Freq: Two times a day (BID) | ORAL | 1 refills | Status: DC | PRN

## 2017-09-24 MED ORDER — FILGRASTIM 480 MCG/1.6ML IJ SOLN: INTRAMUSCULAR | 3 refills | Status: DC

## 2017-09-24 MED ORDER — ALPRAZOLAM 0.5 MG PO TABS: 0.5000 mg | ORAL_TABLET | Freq: Three times a day (TID) | ORAL | 0 refills | Status: DC | PRN

## 2017-09-24 MED ORDER — SERTRALINE HCL 50 MG PO TABS: 75.0000 mg | ORAL_TABLET | Freq: Every day | ORAL | 1 refills | Status: DC

## 2017-09-24 MED ORDER — LIDOCAINE 5 % EX PTCH: 1.0000 | MEDICATED_PATCH | CUTANEOUS | 0 refills | Status: DC

## 2017-09-24 MED ORDER — LEVOTHYROXINE SODIUM 150 MCG PO TABS: 150.0000 ug | ORAL_TABLET | Freq: Every day | ORAL | 1 refills | Status: AC

## 2017-09-24 MED ORDER — POLYVINYL ALCOHOL 1.4 % OP SOLN: 1.0000 [drp] | Freq: Every day | OPHTHALMIC | 0 refills | Status: DC

## 2017-09-24 MED ORDER — ACETAMINOPHEN 325 MG PO TABS: 650.0000 mg | ORAL_TABLET | Freq: Four times a day (QID) | ORAL | 1 refills | Status: DC | PRN

## 2017-09-24 MED ORDER — PSYLLIUM 95 % PO PACK: 1.0000 | PACK | Freq: Two times a day (BID) | ORAL | 1 refills | Status: DC

## 2017-09-24 NOTE — Progress Notes (Signed)
PROGRESS NOTE  [name]  YOV:[id number] DOB: Sep 24, 1955 DOA: 08/29/2017   PCP: Ann Held, DO   Brief Narrative: 62 year old with a history of Hyperlipidemia, Diabetes Mellitus type 2, Hypothyroidism, idiopathic neutropenia, breast cancer status post bilateral mastectomy, ovarian and endometrial cancer status post colectomy in 2016 and takedown in August 2018 with ileocecal resection" and anal anastomosis and diverting loop ileostomy along with lysis of adhesion, urethral stent exchange with psoas bladder hitch.Initially admitted 08/29/17.CT of her abdomen pelvis showed new colovesicular fistula therefore she was admitted and was also noted to have subacute renal failure. As per Dr Sande Rives suspect she will need completion pelvic exoneration with cystectomy and ileal conduit and takedown of coloanal anastomosis and permanent colostomygiven poor healing of bladder & chronic pelvic collection.     Plan:- 1)Bladder Injury/Right Ureteral Stricture - s/p robotic RIGHT ureteral reimplant with psoas hitch + HM Flap 04/01/17 at time of colostomy take down / adhesiolysis, small bowel resection, loop iliostomy for right distal stricture / bladder injury sustained at pelvic resection for advanced endometrial cancer 2016.Stent removed 08/17/17.s/p bilateral PCN's placed on 09/02/17,pt with ongoing discomfort at the right nephrostomy site,- per IR, do not recommend replacement of existing right PCN at new access point.FU CT 08/25/17 with resolved right hydro.imaging post-repair favorable, less right hydro than in years which suggests succesfull repair.Her mild left caliectasis is chronic and stable accounting for different imaging modalities.Dr Phebe Colla following, f/u advised in 6 to 8 weeks (Please keep bilateral nephrostomy tubes and Foley catheter in place until seen by Dr. Tresa Moore).   2)Metastatic Endometrial Cancer - s/p repeat resection 11/2014 with colon and vaginal  involvement but negative nodes / margins. Had adjuvant chemo-XRT which she is now done with. Follows with Dr. Alvy Bimler.  3)Acute on Chronic Renal Failure --due todehydration, on admission creatinine was around 4,improved, creatinine is now 1.85 (creatinine peaked at 4.28 on 08/29/17), pt hadhigh output iliostomy, CT without hydro. Renal USFrom1/29/19with some mild (stable) caliectasis that is stable x months and no right hydro at all.CT cystogram with free bilateral reflux (also confirming no high grade GU obstruction) but with significant GI-GU fistula leading to systemic absorption of urine.   4) Bacteruria/ Malaise- pt with bacteruria on admit. No fevers / leukocytosis. Most recentculture12/2018 (when still had foley in place) serratia and enterococcus sens cipro. [name] 1/29 - serratia , pt already placed on Cipro to complete 10 days therapy, today is day #6/10  5) Rectal - Bladder Fistula-improved diversion away from fistula withBilneph tubes and foley (Please keep bilateral nephrostomy tubes and Foley catheter in place until seen by Dr. Tresa Moore) All to remain for now  as this making her hygeine and care managable.   6)Presacral Abscess- s/p CT guided placement of presacral drain 09/16/17, gram stain with group B strep, pt already placed on Cipro to complete 10 days therapy, today is day #6/10, Repeat CT on 09/22/17 shows pre-sacral drain has retracted out of pelvic cavity and lies within gluteal muscle bed. No definitive residual organized fluid collection. Drain removed at bedside by IR on 09/22/17, High concern for recurrence of pelvic abscess given output prior to removal. Will follow clinically. If rising WBC or fevers, would recommend repeat CT, preferably with contrast if possible, to assess for drainable abscess.  7)Generalized Weakness/Debility/deconditioning-discharge to [facility] skilled nursing facility for rehab,   8)Follow-up-will likely need permanent  GI and GU diversion when nutritionally replete, this will be months away. As per Dr Sande Rives suspect she will need completion  pelvic exoneration with cystectomy and ileal conduit and takedown of coloanal anastomosis and permanent colostomygiven poor healing of bladder & chronic pelvic collection.Urology follow-up with Dr. [name] in 6 to 8 weeks advised.   9)Pain Control- fentanyl patch 75 mcg as ordered, prn p.o. Dilaudid for breakthrough pain, Cymbalta has been added for both depression and chronic pain concerns,   10)DM type II with complications of nephropathy- - Stable, continue Insulin Glargine 25 U BID, - Continue insulin NovoLog with meal coverage 7 units, nighttime coverage insulin,  continue Januvia 100 mg,   11)Depression- tearful at times, continue Lyrica, trazodone andtaper off Zoloft and titrate up with Cymbalta given pain concerns  12)Breast, ovarian and endometrial cancer-- continue anastrozole 1 mg  13)Hypothyroidism- c/n synthroid  14)Nutritional Marasms-continue to replace electrolytes albumin is low, increase oral intake advised, encourage nutritional supplements  15)Idiopathic neutropenia, anemia-Continue Granix q 6 days  (last dose 09/22/17) ,leukocytosis post Granix noted,transfused two U PRBC 09/19/17, Hgb stable  16)Dyslipidemia-continue statin  17)Gluteal skin breakdown-- continue barrier cream,- WOC consultappreciated  18)Disposition- Unable to discharge to [facility] rehab facility on 09/24/2017 as they have revoked their bed offer   PROCEDURES: 1. Cystoscopy with right retrograde pyelogram. 2. Removal of right ureteral stent. 3)Right ureteral stricture, status post complex Reconstruction. 4)left buttock area percutaneous drain removed on 09/22/17   Procedures and studies: 08/17/2017 1.Cystoscopy with right retrograde pyelogram. 2. Removal of right ureteral stent.  09/02/2017  1.Bilateral nephrostomy tube  placement  09/13/2017 Right nephrostogram 1.Right nephrectomy tube intact, no evidence of leak, distal cath well positioned in tight renal pelvis   08/31/2017 CT abdomen pelvis without contrast 1. Rectovesical fistula 2. Probable blind-ending fistulous tract extending into the presacral space from the rectum adjacent to the orifice of the rectovesical fistula 3. Reflux of contrast material into the right kidney via the right ureter with moderate atrophy of the right kidney. Nonobstructive calculi in the lower pole collecting system of the left kidney measuring up to 11 mm.  08/30/2017 renal ultrasound: 1. Small nonobstructive calculi in the collecting systems of both kidneys. 2. Mild to moderate hydronephrosis.  Consultants:  Surgery  Urology  PCT  IR Wound care/PT   DVT prophylaxis - Lovenox SQ    Subjective: RN [name] at bedside, no fevers, no sob, ate ok  Objective: Vitals:   09/23/17 2052 09/24/17 0357 09/24/17 0627 09/24/17 1322  BP: (!) 122/55 (!) 114/44  (!) 107/54  Pulse: 70 72  71  Resp: 18 18  18   Temp: 98.9 F (37.2 C) 98.6 F (37 C)  98.5 F (36.9 C)  TempSrc: Oral Oral  Oral  SpO2: 100% 100%  100%  Weight:   89.6 kg (197 lb 8.5 oz)   Height:        Intake/Output Summary (Last 24 hours) at 09/24/2017 1620 Last data filed at 09/24/2017 1321 Gross per 24 hour  Intake 710 ml  Output 1800 ml  Net -1090 ml   Filed Weights   09/22/17 0500 09/23/17 0500 09/24/17 0627  Weight: 95.4 kg (210 lb 5.1 oz) 90.7 kg (199 lb 14.4 oz) 89.6 kg (197 lb 8.5 oz)   Physical Exam  Gen:- Awake Alert, In no apparent distress  HEENT:- [place].AT,No sclera icterus Neck-Supple Neck,No JVD,.  Lungs- CTAB CV- S1, S2 normal Abd- +ve B.Sounds, Abd Soft, bilateral nephrostomy tubes noted with urine (Lt > Rt), ileostomy bag with liquid fecal material Extremity/Skin:- No edema,  Psych-tearful at times  neuro- no new Focal deficits GU-Foley with clear  urine   Data Reviewed:   CBC: Recent Labs  Lab 09/18/17 0416 09/19/17 0412 09/20/17 0511 09/21/17 0411 09/23/17 0339  WBC 8.5 8.8 14.4* 11.8* 13.1*  HGB 7.2* 6.5* 9.1* 8.5* 8.9*  HCT 24.2* 21.9* 29.2* 27.8* 28.9*  MCV 96.0 96.1 93.0 94.9 95.7  PLT 333 303 287 264 193   Basic Metabolic Panel: Recent Labs  Lab 09/18/17 0416 09/19/17 0412 09/20/17 0511 09/21/17 0411 09/22/17 0420 09/23/17 0339  NA 136 138 136 139  --  137  K 4.0 3.6 4.0 4.1  --  3.6  CL 105 108 109 112*  --  108  CO2 21* 21* 19* 19*  --  17*  GLUCOSE 179* 130* 171* 138*  --  125*  BUN 49* 44* 35* 31*  --  30*  CREATININE 2.31* 2.18* 1.88* 2.03* 1.97* 1.85*  CALCIUM 9.4 8.7* 8.7* 9.0  --  8.8*  MG 1.5* 1.6* 1.5* 1.9  --   --    Liver Function Tests: Recent Labs  Lab 09/18/17 0416  AST 21  ALT 21  ALKPHOS 160*  BILITOT 0.2*  PROT 6.9  ALBUMIN 2.0*   CBG: Recent Labs  Lab 09/23/17 1230 09/23/17 1709 09/23/17 1948 09/24/17 0724 09/24/17 1122  GLUCAP 133* 161* 137* 130* 168*    Scheduled Meds: . acetaminophen  1,000 mg Oral TID WC & HS  . anastrozole  1 mg Oral Daily  . artificial tears   Both Eyes QHS  . cholecalciferol  10,000 Units Oral Weekly  . ciprofloxacin  500 mg Oral BID  . DULoxetine  30 mg Oral BID  . enoxaparin (LOVENOX) injection  30 mg Subcutaneous Q24H  . famotidine  20 mg Oral Daily  . fentaNYL  75 mcg Transdermal Q72H  . ferrous sulfate  325 mg Oral QHS  . Gerhardt's butt cream   Topical QID  . insulin aspart  0-20 Units Subcutaneous TID WC  . insulin aspart  0-5 Units Subcutaneous QHS  . insulin aspart  7 Units Subcutaneous TID WC  . insulin glargine  25 Units Subcutaneous BID  . levothyroxine  150 mcg Oral QAC breakfast  . lidocaine  1 patch Transdermal Q24H  . linagliptin  5 mg Oral Daily  . lip balm  1 application Topical BID  . polyvinyl alcohol  1 drop Both Eyes QHS  . pregabalin  75 mg Oral BID  . prenatal multivitamin  1 tablet Oral Daily  . psyllium   1 packet Oral BID  . rosuvastatin  10 mg Oral QPM  . sertraline  75 mg Oral Daily  . sodium chloride flush  5 mL Intracatheter Q8H  . Tbo-Filgrastim  480 mcg Subcutaneous 6 days   Continuous Infusions:   LOS: 26 days   Roxan Hockey, MD [facility] [phone]  If 7PM-7AM, please contact night-coverage [email or web address] Password [email or web address] 09/24/2017, 4:20 PM

## 2017-09-24 NOTE — Clinical Social Work Placement (Deleted)
   CLINICAL SOCIAL WORK PLACEMENT  NOTE  Date:  09/24/2017  Patient Details  Name: [name] MRN: [id number] Date of Birth: 1955-12-28  Clinical Social Work is seeking post-discharge placement for this patient at the [place] level of care (*CSW will initial, date and re-position this form in  chart as items are completed):  Yes   Patient/family provided with [facility] Work Department's list of facilities offering this level of care within the geographic area requested by the patient (or if unable, by the patient's family).  Yes   Patient/family informed of their freedom to choose among providers that offer the needed level of care, that participate in Medicare, Medicaid or managed care program needed by the patient, have an available bed and are willing to accept the patient.  Yes   Patient/family informed of [facility]'s ownership interest in [facility] and [facility], as well as of the fact that they are under no obligation to receive care at these facilities.  PASRR submitted to EDS on 09/24/17     PASRR number received on 09/24/17([phone] [id number])     Existing PASRR number confirmed on       FL2 transmitted to all facilities in geographic area requested by pt/family on 09/06/17     FL2 transmitted to all facilities within larger geographic area on       Patient informed that his/her managed care company has contracts with or will negotiate with certain facilities, including the following:        Yes   Patient/family informed of bed offers received.  Patient chooses bed at [facility]     Physician recommends and patient chooses bed at      Patient to be transferred to [facility] on 09/24/17.  Patient to be transferred to facility by Ambulance     Patient family notified on 09/24/17 of transfer.  Name of family member notified:  Norinne Jeane, husband by phone [phone]     PHYSICIAN       Additional Comment:     _______________________________________________ Sable Feil, LCSW 09/24/2017, 2:28 PM

## 2017-09-24 NOTE — Progress Notes (Signed)
Report given to Lattie Haw, RN Supervisor  @ [facility] regarding pt status/condition and reason for transfer to facility. VSS.  Lorriane Shire, SW, notified that report called and EMS could be notified for pick up.

## 2017-09-24 NOTE — Discharge Instructions (Signed)
1)Follow up with Dr. Alvy Bimler in  3 to 4 weeks 2)Follow-up with Dr. Phebe Colla in 6-8 weeks 3)Follow-up with Dr. Johney Maine general surgery in 2 months 4)Wound care and ostomy nurse consult for colostomy/ileostomy care and sacral/buttock decubitus at [facility] rehab facility 5)Consider Palliative care/pain specialist consult at [facility] rehab facility 6)May Titrate Cymbalta upward slowly and weaning off Zoloft/Sertraline over the next 2-6 weeks as tolerated  7) call or return if persistent fevers or chills, increased abdominal or pelvic area discomfort or pain     Insulin Injection Instructions, Using Insulin Pens, Adult A subcutaneous injection is a shot of medicine that is injected into the layer of fat between skin and muscle. People with type 1 diabetes must take insulin because their bodies do not make it. People with type 2 diabetes may need to take insulin. There are many different types of insulin. The type of insulin that you take may determine how many injections you give yourself and when you need to take the injections. Choosing a site for injection Insulin absorption varies from site to site. It is best to inject insulin within the same body area, using a different spot in that area for each injection. Do not inject the insulin in the same spot for each injection. There are five main areas that can be used for injecting. These areas include:  Abdomen. This is the preferred area.  Front of thigh.  Upper, outer side of thigh.  Back of upper arm.  Buttocks.  Using an insulin pen First, follow the steps for Getting Ready, then continue with the steps for Injecting the Insulin. Getting Ready 1. Wash your hands with soap and water. If soap and water are not available, use hand sanitizer. 2. Check the expiration date and type of insulin in the pen. 3. If you are using CLEAR insulin, check to see that it is clear and free of clumps. 4. If you are using CLOUDY insulin, gently roll the  pen between your palms several times, or tip the pen up and down several times to mix up the medicine. Do not shake the pen. 5. Remove the cap from the insulin pen. 6. Use an alcohol wipe to clean the rubber stopper of the pen cartridge. 7. Remove the protective paper tab from the disposable needle. Do not let the needle touch anything. 8. Screw the needle onto the pen. 9. Remove the outer and inner plastic covers from the needle. Do not throw away the outer plastic cover yet. 10. Prime the insulin pen by turning the button (dial) to 2 units. Hold the pen with the needle pointing up, and push the button on the opposite end of the pen until a drop of insulin appears at the needle tip. If no insulin appears, repeat this step. 11. Dial the number of units of insulin that you will be injecting. Injecting the Insulin  1. Use an alcohol wipe to clean the site where you will be injecting the needle. Let the site air-dry. 2. Hold the pen in the palm of your writing hand with your thumb on the top. 3. If directed by your health care provider, use your other hand to pinch and hold about an inch of skin at the injection site. Do not directly touch the cleaned part of the skin. 4. Gently but quickly, put the needle straight into the skin. The needle should be at a 90-degree angle (perpendicular) to the skin, as if to form the letter "L." ? For example,  if you are giving an injection in the abdomen, the abdomen forms one "leg" of the "L" and the needle forms the other "leg" of the "L." 5. For adults who have a small amount of body fat, the needle may need to be injected at a 45-degree angle instead. Your health care provider will tell you if this is necessary. ? A 45-degree angle looks like the letter "V." 6. When the needle is completely inserted into the skin, use the thumb of your writing hand to push the top button of the pen down all the way to inject the insulin. 7. Let go of the skin that you are  pinching. Continue to hold the pen in place with your writing hand. 8. Wait five seconds, then pull the needle straight out of the skin. 9. Carefully put the larger (outer) plastic cover of the needle back over the needle, then unscrew the capped needle and discard it in a sharps container, such as an empty plastic bottle with a cover. 10. Put the plastic cap back on the insulin pen. Throwing away supplies  Discard all used needles in a puncture-proof sharps disposal container. You can ask your local pharmacy about where you can get this kind of disposal container, or you can use an empty liquid laundry detergent bottle that has a cover.  Follow the disposal regulations for the area where you live. Do not use any needle more than one time.  Throw away empty disposable pens in the regular trash. What questions should I ask my health care provider?  How often should I be taking insulin?  How often should I check my blood glucose?  What amount of insulin should I be taking at each time?  What are the side effects?  What should I do if my blood glucose is too high?  What should I do if my blood glucose is too low?  What should I do if I forget to take my insulin?  What number should I call if I have questions? Where can I get more information?  American Diabetes Association (ADA): www.diabetes.org  American Association of Diabetes Educators (AADE) Patient Resources: https://www.diabeteseducator.org/patient-resources This information is not intended to replace advice given to you by your health care provider. Make sure you discuss any questions you have with your health care provider. Document Released: 08/22/2015 Document Revised: 12/25/2015 Document Reviewed: 08/22/2015 Elsevier Interactive Patient Education  [employer].    1)Follow up with Dr. Alvy Bimler in  3 to 4 weeks 2)Follow-up with Dr. Phebe Colla in 6-8 weeks 3)Follow-up with Dr. Johney Maine general surgery in 2  months 4)Wound care and ostomy nurse consult for colostomy/ileostomy care and sacral/buttock decubitus at [facility] rehab facility 5)Consider Palliative care/pain specialist consult at [facility] rehab facility 6)May Titrate Cymbalta upward slowly and weaning off Zoloft/Sertraline over the next 2-6 weeks as tolerated  7) call or return if persistent fevers or chills, increased abdominal or pelvic area discomfort or pain

## 2017-09-24 NOTE — Discharge Summary (Addendum)
[name], is a 62 y.o. female  DOB 08-25-1955  MRN [id number].  Admission date:  08/29/2017  Admitting Physician  Michael [name], MD  Discharge Date:  09/25/17  Primary MD  Carollee Herter, Alferd Apa, DO  Recommendations for primary care physician for things to follow:   1)Follow up with Dr. Alvy Bimler in  3 to 4 weeks 2)Follow-up with Dr. Phebe Colla in 6-8 weeks 3)Follow-up with Dr. Johney Maine general surgery in 2 months 4)Wound care and ostomy nurse consult for colostomy/ileostomy care and sacral/buttock decubitus at [facility] rehab facility 5)Consider Palliative care/pain specialist consult at [facility] rehab facility 6)May Titrate Cymbalta upward slowly and weaning off Zoloft/Sertraline over the next 2-6 weeks as tolerated  7) call or return if persistent fevers or chills, increased abdominal or pelvic area discomfort or pain   Admission Diagnosis  High output ileostomy elevated creatinin renal failure    Discharge Diagnosis  High output ileostomy elevated creatinin renal failure     Principal Problem:   MDD (major depressive disorder), single episode, moderate ([place]) Active Problems:   Primary hypothyroidism   DM type 2 (diabetes mellitus, type 2) ([place])   History of ovarian & endometrial cancer   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 s/p resection/partial vaginectomy/ LAR/colostomy 11/19/2014   Morbid obesity ([place])   Ureteral stricture, right, 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) 04/01/2017   Pelvic cancer s/p colostomy takedown/loop ileostomy diversion 04/01/2017   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   Protein-calorie malnutrition, moderate (HCC)   Chronic kidney disease (CKD), stage III (moderate) (HCC)   Dehydration   ARF (acute renal failure) ([place])   Adjustment disorder with mixed anxiety and depressed mood   Poorly controlled diabetes mellitus ([place])  Pressure injury of skin   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 Adrenal adenoma, left 02/08/2016   CT: stable benign  . Anemia in neoplastic disease   . Benign essential HTN   . 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 Cancer of corpus uteri, except isthmus [facility])  oncologist-- dr Denman [name] and dr Alvy Bimler    10-15-2004  dx endometroid endometrial and ovarian cancer s/p  chemotheapy and surgery(TAH w/ BSO) :  recurrent 11-19-2014 post pelvic surgery and radiation 01-29-2015 to 03-10-2015  . Chronic idiopathic neutropenia (HCC)    presumed related to chemotherapy March 2006--- followed by dr Alvy Bimler (treatment w/ G-CSF injections  . Chronic nausea   . Chronic pain    perineal/ anal  area from bladder pad irritates skin , right flank pain  . CKD stage G2/A3, GFR 60-89 and albumin creatinine ratio >300 mg/g    nephrologist-  dr Madelon Lips  . Diabetic retinopathy, background ([place])   . Difficult intravenous access  small veins--- hx PICC lines  . DM type 2 (diabetes mellitus, type 2) ([place])    monitored by dr Legrand Como altheimer  . Dysuria   . Environmental and seasonal allergies   . Fatty liver 02/08/2016   CT  . Generalized muscle weakness   . GERD (gastroesophageal reflux disease)   . Hiatal hernia   . History of abdominal abscess 04/16/2017   post surgery 04-01-2017  --- resolved 10/ 2018  . History of gastric polyp    2014  duodenum  . History of ileus 04/16/2017   resolved w/ no surgical intervention  . History of radiation therapy    01-29-2015 to 03-10-2015  pelvis 50.4Gy  . Hypothyroidism    monitored by dr Legrand Como altheimer  . Ileostomy in place [facility]) 04/01/2017   created at same time  colostomy takedown.  . Lower urinary tract symptoms (LUTS)    urge urinary  incontinence  . Mixed dyslipidemia   . Multiple thyroid nodules    Managed by Dr. Harlow Asa  . Pelvic abscess in female 04/16/2017  . PONV (postoperative nausea and vomiting)    "scopolamine patch works for me"  . Radiation-induced dermatitis    contact dermatitis , radiation completed, rash only on ankles now.  . Seasonal allergies   . Ureteral stricture, right UROLOGIT-  DR [facility]   CHRONIC--  TREATMENT URETERAL STENT  . 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 Vitamin D deficiency   . Wears glasses     Past Surgical History:  Procedure Laterality Date  . APPENDECTOMY    . BREAST RECONSTRUCTION WITH PLACEMENT OF TISSUE EXPANDER AND FLEX HD (ACELLULAR HYDRATED DERMIS) Bilateral 12/11/2015   Procedure: BILATERAL BREAST RECONSTRUCTION WITH PLACEMENT OF TISSUE EXPANDERS;  Surgeon: Irene Limbo, MD;  Location: [place];  Service: Plastics;  Laterality: Bilateral;  . COLONOSCOPY WITH PROPOFOL N/A 08/21/2013   Procedure: COLONOSCOPY WITH PROPOFOL;  Surgeon: Cleotis Nipper, MD;  [facility];  Service: Endoscopy;  Laterality: N/A;  . COLOSTOMY TAKEDOWN N/A 12/04/2014   Procedure: LAPROSCOPIC LYSIS OF ADHESIONS, SPLENIC MOBILIZATION, RELOCATION OF COLOSTOMY, DEBRIDEMENT INITIAL COLOSTOMY SITE;  Surgeon: Michael [name], MD;  [facility];  Service: General;  Laterality: N/A;  . CYSTOGRAM N/A 06/01/2017   Procedure: CYSTOGRAM;  Surgeon: Alexis Frock, MD;  [facility];  Service: Urology;  Laterality: N/A;  . CYSTOSCOPY W/ RETROGRADES Right 11/21/2015   Procedure: CYSTOSCOPY WITH RETROGRADE PYELOGRAM;  Surgeon: Alexis Frock, MD;  [facility];  Service: Urology;  Laterality: Right;  . CYSTOSCOPY W/ URETERAL STENT PLACEMENT Right 11/21/2015   Procedure: CYSTOSCOPY WITH STENT REPLACEMENT;  Surgeon: Alexis Frock, MD;  [facility];  Service: Urology;  Laterality: Right;  . CYSTOSCOPY W/  URETERAL STENT PLACEMENT Right 03/10/2016   Procedure: CYSTOSCOPY WITH STENT REPLACEMENT;  Surgeon: Alexis Frock, MD;  Location: [facility];  Service: Urology;  Laterality: Right;  . CYSTOSCOPY W/ URETERAL STENT PLACEMENT Right 06/30/2016   Procedure: CYSTOSCOPY WITH RETROGRADE PYELOGRAM/URETERAL STENT EXCHANGE;  Surgeon: Alexis Frock, MD;  Location[facility];  Service: Urology;  Laterality: Right;  . CYSTOSCOPY W/ URETERAL STENT PLACEMENT N/A 06/01/2017   Procedure: CYSTOSCOPY WITH EXAM UNDER ANESTHESIA;  Surgeon: Alexis Frock, MD;  [facility];  Service: Urology;  Laterality: N/A;  . CYSTOSCOPY W/ URETERAL STENT PLACEMENT Right 08/17/2017   Procedure: CYSTOSCOPY WITH RETROGRADE PYELOGRAM/URETERAL STENT REMOVAL;  Surgeon: Alexis Frock, MD;  Location: [facility];  Service: Urology;  Laterality: Right;  . CYSTOSCOPY  WITH RETROGRADE PYELOGRAM, URETEROSCOPY AND STENT PLACEMENT Right 03/20/2015   Procedure: CYSTOSCOPY WITH RETROGRADE PYELOGRAM, URETEROSCOPY WITH BALLOON DILATION AND STENT PLACEMENT ON RIGHT;  Surgeon: Alexis Frock, MD;  Location: [facility];  Service: Urology;  Laterality: Right;  . CYSTOSCOPY WITH RETROGRADE PYELOGRAM, URETEROSCOPY AND STENT PLACEMENT Right 05/02/2015   Procedure: CYSTOSCOPY WITH RIGHT RETROGRADE PYELOGRAM,  DIAGNOSTIC URETEROSCOPY AND STENT PULL ;  Surgeon: Alexis Frock, MD;  Location: [facility];  Service: Urology;  Laterality: Right;  . CYSTOSCOPY WITH RETROGRADE PYELOGRAM, URETEROSCOPY AND STENT PLACEMENT Right 09/05/2015   Procedure: CYSTOSCOPY WITH RETROGRADE PYELOGRAM,  AND STENT PLACEMENT;  Surgeon: Alexis Frock, MD;  [facility];  Service: Urology;  Laterality: Right;  . CYSTOSCOPY WITH RETROGRADE PYELOGRAM, URETEROSCOPY AND STENT PLACEMENT Right 04/01/2017   Procedure: CYSTOSCOPY WITH RETROGRADE PYELOGRAM, URETEROSCOPY AND STENT PLACEMENT;  Surgeon: Alexis Frock, MD;  [facility];  Service: Urology;  Laterality: Right;  . CYSTOSCOPY WITH STENT PLACEMENT Right 10/27/2016   Procedure: CYSTOSCOPY WITH STENT CHANGE and right retrograde pyelogram;  Surgeon: Alexis Frock, MD;  Location: [facility];  Service: Urology;  Laterality: Right;  . EUS N/A 10/02/2014   Procedure: LOWER ENDOSCOPIC ULTRASOUND (EUS);  Surgeon: Arta Silence, MD;  Location: [facility];  Service: Endoscopy;  Laterality: N/A;  . EXCISION SOFT TISSUE MASS RIGHT FOREMAN  12-08-2006  . EYE SURGERY  as child   pytosis of eyelids repair  . INCISION AND DRAINAGE OF WOUND Bilateral 12/26/2015   Procedure: DEBRIDEMENT OF BILATERAL MASTECTOMY FLAPS;  Surgeon: Irene Limbo, MD;  Location: [place];  Service: Plastics;  Laterality: Bilateral;  . IR CV LINE INJECTION  05/31/2017  . IR FLUORO GUIDE CV LINE LEFT  05/31/2017  . IR FLUORO GUIDE CV LINE RIGHT  04/06/2017  . IR FLUORO GUIDE CV MIDLINE PICC RIGHT  05/30/2017  . IR NEPHROSTOGRAM LEFT INITIAL PLACEMENT  09/02/2017  . IR NEPHROSTOGRAM RIGHT INITIAL PLACEMENT  09/02/2017  . IR NEPHROSTOGRAM RIGHT THRU EXISTING ACCESS  09/13/2017  . IR RADIOLOGIST EVAL & MGMT  05/03/2017  . IR US GUIDE VASC ACCESS LEFT  05/31/2017  . IR US GUIDE VASC ACCESS RIGHT  04/06/2017  . IR US GUIDE VASC ACCESS RIGHT  05/30/2017  . LAPAROSCOPIC CHOLECYSTECTOMY  1990  . LIPOSUCTION WITH LIPOFILLING Bilateral 04/16/2016   Procedure: LIPOSUCTION WITH LIPOFILLING TO BILATERAL CHEST;  Surgeon: Irene Limbo, MD;  Location: [place];  Service: Plastics;  Laterality: Bilateral;  . MASTECTOMY W/ SENTINEL NODE BIOPSY Bilateral 12/11/2015   Procedure: RIGHT PROPHYLACTIC MASTECTOMY, LEFT TOTAL MASTECTOMY WITH LEFT AXILLARY SENTINEL LYMPH NODE BIOPSY;  Surgeon: Stark Klein, MD;  Location: [place];  Service: General;  Laterality: Bilateral;  . OSTOMY N/A 11/19/2014   Procedure: OSTOMY;  Surgeon: Michael [name], MD;  [facility];  Service: General;  Laterality: N/A;  . PROCTOSCOPY N/A 04/01/2017   Procedure: RIDGE PROCTOSCOPY;  Surgeon: Michael [name], MD;  [facility];  Service: General;  Laterality: N/A;  . REMOVAL OF BILATERAL TISSUE EXPANDERS WITH PLACEMENT OF BILATERAL BREAST IMPLANTS Bilateral 04/16/2016   Procedure: REMOVAL OF BILATERAL TISSUE EXPANDERS WITH PLACEMENT OF BILATERAL BREAST IMPLANTS;  Surgeon: Irene Limbo, MD;  Location: [place];  Service: Plastics;  Laterality: Bilateral;  . ROBOTIC ASSISTED LAP VAGINAL HYSTERECTOMY N/A 11/19/2014   Procedure: ROBOTIC LYSIS OF ADHESIONS, CONVERTED TO LAPAROTOMY RADICAL UPPER VAGINECTOMY,LOW ANTERIOR BOWEL RESECTION, COLOSTOMY, BILATERAL URETERAL STENT PLACEMENT AND CYSTONOMY CLOSURE;  Surgeon: Terrence Dupont  Denman [name], MD;  [facility];  Service: Gynecology;  Laterality: N/A;  . TISSUE EXPANDER FILLING Bilateral 12/26/2015   Procedure: EXPANSION OF BILATERAL CHEST TISSUE EXPANDERS (60 mL- Right; 75 mL- Left);  Surgeon: Irene Limbo, MD;  Location: [place];  Service: Plastics;  Laterality: Bilateral;  . TONSILLECTOMY    . TOTAL ABDOMINAL HYSTERECTOMY  March 2006   [facility]   and Bilateral Salpingoophorectomy/  staging for Ovarian cancer/  an  . 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 Had her stent removed 08/17/17 and at that time also had robotic lysis of adhesions. Patient admitted by general surgery after struggling post surgery 2 weeks ago. Difficulty eating and drinking and with high output ileostomy. Despite hydration her creatinine has continue rising and CCS asked for assistance with medical management. Patient denies CP, SOB, HA's, hematuria, dysuria, palpitations, melena, hematochezia or any other complaints.   Of note, she reported having some lightheadedness feeling whe standing for too long and also increase fatigue.       Hospital Course:     Brief Narrative:  62 year old with a history of Hyperlipidemia, Diabetes Mellitus type 2, Hypothyroidism, idiopathic neutropenia, breast cancer status post bilateral mastectomy, ovarian and endometrial cancer status post colectomy in 2016 and takedown in August 2018 with ileocecal resection" and anal anastomosis and diverting loop ileostomy along with lysis of adhesion, urethral stent exchange with psoas bladder hitch. Initially admitted 08/29/17.  CT of her abdomen pelvis showed new colovesicular fistula therefore she was admitted and was also noted to have subacute renal failure.  As per Dr Johney Maine Strongly suspect she will need completion pelvic exoneration with cystectomy and ileal conduit and takedown of coloanal anastomosis and permanent colostomygiven poor healing of bladder & chronic pelvic collection.     Plan:- 1)Bladder Injury/Right Ureteral Stricture - s/p robotic RIGHT ureteral reimplant with psoas hitch + HM Flap 04/01/17 at time of colostomy take down / adhesiolysis, small bowel resection, loop iliostomy for right distal stricture / bladder injury sustained at pelvic resection for advanced endometrial cancer 2016.Stent removed 08/17/17. s/p bilateral PCN's placed on 09/02/17 ,  pt with ongoing discomfort at the right nephrostomy site, - per IR, do not recommend replacement of existing right PCN at new  access point.  FU CT 08/25/17 with resolved right hydro.imaging post-repair favorable, less right hydro than in years which suggests succesfull repair.Her mild left caliectasis is chronic and stable accounting for different imaging modalities. Dr Phebe Colla following, f/u advised in 6 to 8 weeks (Please keep bilateral nephrostomy tubes and Foley catheter in place until seen by Dr. Tresa Moore).   2)Metastatic Endometrial Cancer - s/p repeat resection 11/2014 with colon and vaginal involvement but negative nodes / margins. Had adjuvant chemo-XRT which she is now done with. Follows with Dr. Alvy Bimler.  3)Acute on Chronic Renal Failure -- due to dehydration, on  admission creatinine was around 4,  improved, creatinine is now 1.85 (creatinine peaked at 4.28 on 08/29/17), pt had high output iliostomy,  CT   without hydro.  Renal US  From 08/30/17 with some mild (stable) caliectasis that is stable x months and no right hydro at all.CT cystogram with free bilateral reflux (also confirming no high grade GU obstruction) but with significant GI-GU fistula leading to systemic absorption of urine.   4) Bacteruria/ Malaise- pt with bacteruria on admit. No fevers / leukocytosis. Most recent culture 07/2017 (when still had foley in place) serratia and enterococcus sens cipro. [name] 1/29 - serratia , pt already placed on Cipro to complete 10 days therapy, today is day #6/10  5) Rectal - Bladder Fistula-   improved diversion away from fistula with Bil neph tubes and foley (Please keep bilateral nephrostomy tubes and Foley catheter in place until seen by Dr. Tresa Moore) All to remain for now  as this making her hygeine and care managable.   6)Presacral Abscess-  s/p CT guided placement of presacral drain 09/16/17, gram stain with group B strep,  pt already placed on Cipro to complete 10 days therapy, today is day #6/10, Repeat CT on 09/22/17 shows pre-sacral drain has retracted out of pelvic cavity and lies within gluteal  muscle bed. No definitive residual organized fluid collection. Drain removed at bedside by IR on 09/22/17, High concern for recurrence of pelvic abscess given output prior to removal. Will follow clinically.  If rising WBC or fevers, would recommend repeat CT, preferably with contrast if possible, to assess for drainable abscess.  7)Generalized Weakness/Debility/deconditioning-discharge to [facility] skilled nursing facility for rehab,   8)Follow-up-  will likely need permanent GI and GU diversion when nutritionally replete, this will be months away. As per Dr Johney Maine Strongly suspect she will need completion pelvic exoneration with cystectomy and ileal conduit and takedown of coloanal anastomosis and permanent colostomygiven poor healing of bladder & chronic pelvic collection.  Urology follow-up with Dr. [name] in 6 to 8 weeks advised.   9)Pain Control- fentanyl patch 75 mcg  as ordered, prn p.o. Dilaudid for breakthrough pain, Cymbalta has been added for both depression and chronic pain concerns,   10)DM type II with complications of nephropathy- - Stable, continue Insulin Glargine 25 U BID, - Continue insulin NovoLog with meal coverage 7 units, nighttime coverage insulin,  continue Januvia 100 mg,    11)Depression-  tearful at times, continue Lyrica, trazodone and taper off  Zoloft and titrate up with Cymbalta given pain concerns  12)Breast, ovarian and endometrial cancer- - continue anastrozole 1 mg  13)Hypothyroidism- c/n synthroid   14)Nutritional Marasms-continue to replace electrolytes albumin is low, increase oral intake advised, encourage nutritional supplements  15)Idiopathic neutropenia, anemia-  Continue Granix q 6 days  (last dose 09/22/17) , leukocytosis post Granix noted,  transfused two U PRBC 09/19/17, Hgb stable  16)Dyslipidemia-continue statin  17)Gluteal skin breakdown- - continue barrier cream, - WOC consult appreciated   18)Disposition-  discharge to [facility] rehab facility on 09/25/2017  PROCEDURES: 1. Cystoscopy with right retrograde pyelogram. 2. Removal of right ureteral stent. 3)Right ureteral stricture, status post complex Reconstruction. 4) left buttock area percutaneous drain removed on 09/22/17   Procedures and studies: 08/17/2017 1.Cystoscopy with right retrograde pyelogram. 2. Removal of right ureteral stent.  09/02/2017  1.Bilateral nephrostomy tube placement  09/13/2017 Right nephrostogram 1.Right nephrectomy tube intact, no evidence of leak, distal cath well positioned in tight renal pelvis   08/31/2017 CT abdomen  pelvis without contrast 1. Rectovesical fistula 2. Probable blind-ending fistulous tract extending into the presacral space from the rectum adjacent to the orifice of the rectovesical fistula 3. Reflux of contrast material into the right kidney via the right ureter with moderate atrophy of the right kidney. Nonobstructive calculi in the lower pole collecting system of the left kidney measuring up to 11 mm.  08/30/2017 renal ultrasound: 1. Small nonobstructive calculi in the collecting systems of both kidneys. 2. Mild to moderate hydronephrosis.  Consultants:  Surgery  Urology  PCT  IR Wound care/PT  Discharge Condition: stable  Follow UP- as outlined in the discharge instructions Contact information for after-discharge care    Destination    [facility] .   Service:  Skilled Nursing Contact information: [address] [place] [phone]               Consults obtained - urology/IR/gen surgery/palliative care , PT/SW  Diet and Activity recommendation:  As advised  Discharge Instructions    Discharge Instructions    Call MD for:  difficulty breathing, headache or visual disturbances   Complete by:  As directed    Call MD for:  persistant dizziness or light-headedness   Complete by:  As directed    Call MD for:  persistant  nausea and vomiting   Complete by:  As directed    Call MD for:  redness, tenderness, or signs of infection (pain, swelling, redness, odor or green/yellow discharge around incision site)   Complete by:  As directed    Call MD for:  severe uncontrolled pain   Complete by:  As directed    Call MD for:  temperature >100.4   Complete by:  As directed    Diet - low sodium heart healthy   Complete by:  As directed    Discharge instructions   Complete by:  As directed    1)Follow up with Dr. Alvy Bimler in  3 to 4 weeks 2)Follow-up with Dr. Phebe Colla in 6-8 weeks 3)Follow-up with Dr. Johney Maine general surgery in 2 months 4)Wound care and ostomy nurse consult for colostomy/ileostomy care and sacral/buttock decubitus at [facility] rehab facility 5)Consider Palliative care/pain specialist consult at [facility] rehab facility 6)May Titrate Cymbalta upward slowly and weaning off Zoloft/Sertraline over the next 2-6 weeks as tolerated  7) call or return if persistent fevers or chills, increased abdominal or pelvic area discomfort or pain   Increase activity slowly   Complete by:  As directed         Discharge Medications     Allergies as of 09/25/2017      Reactions   Penicillins Swelling   Facial swelling/childhood allergy Has patient had a PCN reaction causing immediate rash, facial/tongue/throat swelling, SOB or lightheadedness with hypotension: Yes Has patient had a PCN reaction causing severe rash involving mucus membranes or skin necrosis: Yes Has patient had a PCN reaction that required hospitalization yes Has patient had a PCN reaction occurring within the last 10 years: No If all of the above answers are "NO", then may proceed with Cephalosporin use.   Adhesive [tape]    blisters   Cefaclor Rash   Ceclor   Erythromycin    Gastritis, abd cramps   Trimethoprim Rash   Ultram [tramadol] Hives   Ciprofloxacin Other (See Comments)   Unknown; On Dr notes  During 08/2017 admission, patient  tolerated 7 day course of IV Cipro.   Fluconazole Rash   Oxycodone    "  I just feel weird"   Pectin Rash   Pectin ring for stoma   Sulfa Antibiotics Rash      Medication List    STOP taking these medications   HYDROcodone-acetaminophen 5-325 MG tablet Commonly known as:  NORCO/VICODIN     TAKE these medications   acetaminophen 325 MG tablet Commonly known as:  TYLENOL Take 2 tablets (650 mg total) by mouth every 6 (six) hours as needed.   ALPRAZolam 0.5 MG tablet Commonly known as:  XANAX Take 1 tablet (0.5 mg total) by mouth 3 (three) times daily as needed for anxiety.   anastrozole 1 MG tablet Commonly known as:  ARIMIDEX Take 1 tablet (1 mg total) by mouth daily.   artificial tears Oint ophthalmic ointment Commonly known as:  LACRILUBE Place into both eyes at bedtime.   Biotin 5 MG Tabs Take 5 mg by mouth every morning.   ciprofloxacin 500 MG tablet Commonly known as:  CIPRO Take 1 tablet (500 mg total) by mouth 2 (two) times daily.   diphenhydrAMINE 25 mg capsule Commonly known as:  BENADRYL Take 1 capsule (25 mg total) by mouth every 8 (eight) hours as needed for itching, allergies or sleep.   diphenoxylate-atropine 2.5-0.025 MG tablet Commonly known as:  LOMOTIL Take 1 tablet by mouth 4 (four) times daily as needed for diarrhea or loose stools. TO PREVENT LOOSE BOWEL MOVEMENTS What changed:    how much to take  when to take this  reasons to take this   DULoxetine 30 MG capsule Commonly known as:  CYMBALTA Take 1 capsule (30 mg total) by mouth 2 (two) times daily.   famotidine-calcium carbonate-magnesium hydroxide 10-800-165 MG chewable tablet Commonly known as:  PEPCID COMPLETE Chew 1 tablet by mouth daily as needed (INDIGESTION). What changed:  medication strength   fentaNYL 75 MCG/HR Commonly known as:  DURAGESIC - dosed mcg/hr Place 1 patch (75 mcg total) onto the skin every 3 (three) days.   ferrous sulfate 325 (65 FE) MG tablet Take 1  tablet (325 mg total) by mouth at bedtime.   filgrastim 480 MCG/1.6ML injection Commonly known as:  NEUPOGEN Inject 480 mcg/1.6 ml under the skin every 6 days for life (last dose 09/22/17) What changed:  additional instructions   Gerhardt's butt cream Crea Apply 1 application topically 4 (four) times daily.   HYDROmorphone 4 MG tablet Commonly known as:  DILAUDID Take 1.5 tablets (6 mg total) by mouth every 3 (three) hours as needed for moderate pain or severe pain.   insulin aspart 100 UNIT/ML injection Commonly known as:  novoLOG Inject 0-20 Units into the skin 3 (three) times daily with meals.   insulin aspart 100 UNIT/ML injection Commonly known as:  novoLOG Inject 7 Units into the skin 3 (three) times daily with meals.   insulin glargine 100 UNIT/ML injection Commonly known as:  LANTUS Inject 0.25 mLs (25 Units total) into the skin 2 (two) times daily.   levothyroxine 150 MCG tablet Commonly known as:  SYNTHROID, LEVOTHROID Take 150 mcg by mouth daily before breakfast. What changed:  Another medication with the same name was added. Make sure you understand how and when to take each.   levothyroxine 150 MCG tablet Commonly known as:  SYNTHROID, LEVOTHROID Take 1 tablet (150 mcg total) by mouth daily before breakfast. What changed:  You were already taking a medication with the same name, and this prescription was added. Make sure you understand how and when to take each.   lidocaine  5 % Commonly known as:  LIDODERM Place 1 patch onto the skin daily. Remove & Discard patch within 12 hours   lip balm ointment Apply 1 application topically 2 (two) times daily.   loperamide 2 MG capsule Commonly known as:  IMODIUM Take 1-2 capsules (2-4 mg total) by mouth every 8 (eight) hours as needed for diarrhea or loose stools (Use if >2 BM every 8 hours).   loratadine 10 MG tablet Commonly known as:  CLARITIN Take 10 mg by mouth every morning.   methocarbamol 750 MG  tablet Commonly known as:  ROBAXIN Take 1 tablet (750 mg total) by mouth 3 (three) times daily.   omega-3 acid ethyl esters 1 g capsule Commonly known as:  LOVAZA Take 1 g by mouth 2 (two) times daily.   ondansetron 4 MG disintegrating tablet Commonly known as:  ZOFRAN-ODT Take 4 mg by mouth every 6 (six) hours as needed for nausea or vomiting.   polyvinyl alcohol 1.4 % ophthalmic solution Commonly known as:  LIQUIFILM TEARS Place 1 drop into both eyes at bedtime.   pregabalin 75 MG capsule Commonly known as:  LYRICA Take 1 capsule (75 mg total) by mouth 2 (two) times daily.   PRENATAL VITAMIN PO Take 1 capsule by mouth daily. Takes prenatal because there are no dyes in it   prochlorperazine 10 MG tablet Commonly known as:  COMPAZINE Take 1 tablet (10 mg total) by mouth 2 (two) times daily as needed for nausea or vomiting.   promethazine 25 MG tablet Commonly known as:  PHENERGAN Take 1 tablet (25 mg total) by mouth every 6 (six) hours as needed for nausea or vomiting.   protein supplement shake Liqd Commonly known as:  PREMIER PROTEIN Take 325 mLs (11 oz total) by mouth 2 (two) times daily as needed (Pt to request).   psyllium 95 % Pack Commonly known as:  HYDROCIL/METAMUCIL Take 1 packet by mouth 2 (two) times daily.   ranitidine 150 MG tablet Commonly known as:  ZANTAC Take 1 tablet (150 mg total) by mouth 2 (two) times daily as needed for heartburn.   rosuvastatin 10 MG tablet Commonly known as:  CRESTOR Take 10 mg by mouth every evening.   sertraline 50 MG tablet Commonly known as:  ZOLOFT Take 1.5 tablets (75 mg total) by mouth daily.   sitaGLIPtin 100 MG tablet Commonly known as:  JANUVIA Take 100 mg by mouth every morning.   SYSTANE OP Place 1 drop into both eyes at bedtime.   traZODone 100 MG tablet Commonly known as:  DESYREL Take 1 tablet (100 mg total) by mouth at bedtime as needed for sleep.   Vitamin D3 10000 units capsule Take 10,000 Units  by mouth once a week. Sunday evening's       Major procedures and Radiology Reports - PLEASE review detailed and final reports for all details, in brief -   Ct Abdomen Pelvis Wo Contrast  Result Date: 09/22/2017 CLINICAL DATA:  Status post presacral abscess drainage and bilateral nephrostomies. Generalized abdominal pain. EXAM: CT ABDOMEN AND PELVIS WITHOUT CONTRAST TECHNIQUE: Multidetector CT imaging of the abdomen and pelvis was performed following the standard protocol without IV contrast. COMPARISON:  CT scan of September 15, 2017 and September 16, 2017. FINDINGS: Lower chest: No acute abnormality. Hepatobiliary: No focal liver abnormality is seen. Status post cholecystectomy. No biliary dilatation. Pancreas: Unremarkable. No pancreatic ductal dilatation or surrounding inflammatory changes. Spleen: Normal in size without focal abnormality. Adrenals/Urinary Tract: Stable left adrenal  nodule is noted. Right adrenal gland appears normal. Bilateral nephrostomy tubes are noted in grossly good position. No hydronephrosis or renal obstruction is noted. Urinary bladder is decompressed secondary to Foley catheter. Stomach/Bowel: The stomach appears normal. 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 No abnormal bowel dilatation is noted. Vascular/Lymphatic: No significant vascular findings are present. No enlarged abdominal or pelvic lymph nodes. Reproductive: Status post hysterectomy. No adnexal masses. Other: The presacral fluid collection noted on prior exam may be smaller compared to prior exam, although is difficult to assess due to the lack of intravenous contrast. The drainage catheter placed previously has withdrawn significantly, with the distal tip of the catheter now present within the left gluteal musculature outside of the pelvis. Musculoskeletal: No acute or significant osseous findings. IMPRESSION: The pelvic drainage catheter placed 1 week ago has significantly withdrawn, with the distal tip  now present within the left gluteal musculature, not the presacral space. The presacral fluid collection noted on prior exam may be smaller currently, but it is difficult to assess due to lack of intravenous contrast. Stable bilateral nephrostomy catheters are noted without hydronephrosis or obstruction. 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 Probable ileostomy seen in right upper quadrant, and patient is status post almost complete colectomy. Electronically Signed   By: Marijo Conception, M.D.   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 Unfortunately, the patient has experienced persistent right-sided flank pain attributable to the right-sided nephrostomy catheter. EXAM: CT ABDOMEN AND PELVIS WITHOUT CONTRAST TECHNIQUE: Multidetector CT imaging of the abdomen and pelvis was performed following the standard protocol without IV contrast. COMPARISON:  CT abdomen pelvis-08/31/2017; 02/09/2016; ultrasound fluoroscopic guided bilateral percutaneous nephrostomy catheter placement-09/02/2016; right-sided antegrade nephrostogram - 09/13/2017 FINDINGS: Lack of intravenous contrast limits the ability to evaluate solid abdominal organs. Lower chest: Limited visualization of the lower thorax demonstrates minimal subsegmental atelectasis within the imaged bilateral lower lobes, right greater than left. No discrete focal airspace opacities. No pleural effusion. Normal heart size.  No pericardial effusion. Hepatobiliary: Normal hepatic contour. Post cholecystectomy. Persistent dilatation of the common bile duct, measuring approximately 1.3 cm (as measured in greatest oblique short axis coronal diameter - image 51, series 5), presumably secondary to  biliary reservoir phenomenon. No radiopaque gallstones. No ascites. Pancreas: Normal noncontrast appearance of the pancreas Spleen: Normal noncontrast appearance of the spleen Adrenals/Urinary Tract: Bilateral percutaneous nephrostomy catheters are probe with the position with very mild residual left-sided pelvicaliectasis. No significant right-sided pelvicaliectasis. Both percutaneous nephrostomy catheters take a slightly serpiginous tract however this is likely secondary to patient body habitus. No definitive associated pericatheter hematoma. There is no definitive involvement of either hemidiaphragm at both nephrostomies are below the 12th rib. Mid minimal amount of bilateral perinephric stranding, unchanged. Previously characterized approximately 0.7 cm adenoma within left adrenal gland is unchanged (image 22, series 2). Normal noncontrast appearance of the right adrenal gland. Urinary bladder is decompressed with Foley catheter. Stomach/Bowel: Redemonstrated loop ileostomy within the right mid hemiabdomen and the sequela of near complete colectomy and small bowel resection. There is a grossly unchanged approximately 4.7 x 3.7 cm fluid collection within the presacral space (image 71, series 2) at the location of previously noted contained perforation involving a suspected loop of small bowel extends down to the midline  of the lower pelvis. Mild distention of the stomach. No evidence of enteric obstruction. No pneumoperitoneum, pneumatosis or portal venous gas. Vascular/Lymphatic: Normal caliber the abdominal aorta. No bulky retroperitoneal, mesenteric, pelvic or inguinal lymphadenopathy on this noncontrast examination. Reproductive: Post hysterectomy.  No discrete adnexal lesion. Other: Diffuse body wall anasarca. Stranding within the left upper abdominal quadrant at location of prior end colostomy. Post bilateral breast augmentation. Musculoskeletal: No acute or aggressive osseous abnormalities. No definitive  evidence of osteolysis though the presacral fluid collection does abut the ventral aspect of the sacrum and coccyx. IMPRESSION: 1. Appropriately positioned bilateral percutaneous nephrostomy catheters without evidence complication. 2. Grossly unchanged approximately 4.7 cm fluid collection within the presacral space at the location of previously noted contained perforation involving a suspected loop of small bowel which extends down to the midline of the lower pelvis. While there is no evidence of osteolysis to suggest osteomyelitis, the presacral fluid collection is noted to abut the ventral aspect of the sacrum coccyx. Further evaluation with pelvic MRI could be performed as indicated. 3. Stable sequela of loop ileostomy and small bowel anastomosis without evidence of enteric obstruction. Electronically Signed   By: Sandi Mariscal M.D.   On: 09/15/2017 17:50   Ct Abdomen Pelvis Wo Contrast  Result Date: 08/31/2017 CLINICAL DATA:  62 year old female status post bladder reconstruction in August 2018 for urinary leak. History of ovarian and uterine cancer in 2006 with recurrence in 2016 treated with chemotherapy and radiation therapy. EXAM: CT ABDOMEN AND PELVIS WITHOUT CONTRAST TECHNIQUE: Multidetector CT imaging of the abdomen and pelvis was performed following the standard protocol without IV contrast. COMPARISON:  CT the abdomen and pelvis 08/25/2017. FINDINGS: Lower chest: Bilateral breast implants incidentally noted. Otherwise, unremarkable. Hepatobiliary: Mild diffuse low attenuation throughout the hepatic parenchyma indicative of a background of mild hepatic steatosis. Calcified granuloma in the right lobe of the liver. No definite suspicious cystic or solid hepatic lesions are noted on today's noncontrast CT examination. Status post cholecystectomy. Pancreas: No pancreatic mass or peripancreatic inflammatory changes are noted on today's noncontrast CT examination. Spleen: Unremarkable. Adrenals/Urinary  Tract: Moderate atrophy of the right kidney. Mild bilateral hydroureteronephrosis. Reflux of iodinated contrast material into the right renal collecting system. Nonobstructive calculi in the lower pole collecting system of left kidney, similar to prior study from 08/25/2017, measuring up to 11 mm. Urinary bladder is partially decompressed around an indwelling Foley catheter. 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 Bilateral adrenal glands are normal in appearance. Stomach/Bowel: Normal appearance of the stomach. No pathologic dilatation of small bowel or colon. Postoperative changes of partial colectomy and small bowel resections are noted, with right upper quadrant ostomy. Adjacent to the rectovesical fistula there is a short extension of contrast which extends posteriorly into the presacral space and extends slightly cephalad, presumably a fistulous tract (best demonstrated on axial image 71 of series 2, sagittal images 50-58 of series 5 and coronal images 57-64 of series 4. Vascular/Lymphatic: No atherosclerotic calcifications in the abdominal or pelvic vasculature. No lymphadenopathy noted in the abdomen or pelvis. Reproductive: Status post total abdominal hysterectomy and bilateral salpingo-oophorectomy. Other: Trace volume of ascites in the low anatomic pelvis. No pneumoperitoneum. Musculoskeletal: There are no aggressive appearing lytic or blastic lesions noted in the visualized portions of the skeleton. IMPRESSION: 1. Rectovesical fistula, as  above. 2. 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 3. Reflux of contrast material into the right kidney via the right ureter with moderate atrophy  of the right kidney. Nonobstructive calculi in the lower pole collecting system of the left kidney measuring up to 11 mm. 4. Additional incidental findings, as above. These results will be called to the ordering clinician or representative by the Radiologist Assistant, and communication documented in the PACS or zVision Dashboard. Electronically Signed   By: Vinnie Langton M.D.   On: 08/31/2017 14:34   Dg Chest 2 View  Result Date: 08/31/2017 CLINICAL DATA:  Shortness of breath. EXAM: CHEST  2 VIEW COMPARISON:  Radiographs of August 30, 2017. FINDINGS: The heart size and mediastinal contours are within normal limits. Both lungs are clear. No pneumothorax or pleural effusion is noted. Right-sided PICC line is unchanged in position. The visualized skeletal structures are unremarkable. IMPRESSION: No active cardiopulmonary disease. Electronically Signed   By: Marijo Conception, M.D.   On: 08/31/2017 10:06   Dg Lumbar Spine Complete  Result Date: 09/10/2017 CLINICAL DATA:  Pain aggravated by sitting, fell in bathroom 3 days ago, generalized RIGHT hip pain, midline lower back pain, and bottom pain EXAM: LUMBAR SPINE - COMPLETE 4+ VIEW COMPARISON:  CT abdomen and pelvis 08/31/2017 FINDINGS: Osseous demineralization. Five non-rib-bearing lumbar vertebra. Multilevel disc space narrowing. Vertebral body heights maintained. No acute fracture, subluxation, or bone destruction. SI joints preserved. BILATERAL nephrostomy tubes. Numerous pelvic surgical clips as well as RIGHT upper quadrant surgical clips. IMPRESSION: Osseous demineralization with mild degenerative disc disease changes of the lumbar spine. No acute abnormalities. Electronically Signed   By: [name] M.D.   On: 09/10/2017 20:38   Dg Sacrum/coccyx  Result Date: 09/10/2017 CLINICAL DATA:  Pain aggravated by sitting, fell in bathroom 3 days ago, generalized RIGHT hip pain, midline lower back pain, and bottom pain EXAM: SACRUM AND COCCYX - 2+ VIEW  COMPARISON:  CT abdomen and pelvis 08/31/2017 FINDINGS: Minimal osteitis pubis. SI joints and sacral foramina symmetric. No sacrococcygeal fractures identified. Degenerative disc disease changes at L5-S1 with disc space narrowing, minimal endplate spur formation and endplate sclerosis. IMPRESSION: No acute sacrococcygeal abnormalities. Degenerative disc disease changes at L5-S1. Electronically Signed   By: [name] M.D.   On: 09/10/2017 20:39   US Renal  Result Date: 08/30/2017 CLINICAL DATA:  62 year old female with history of acute renal failure. History of ureteral stricture. EXAM: RENAL / URINARY TRACT ULTRASOUND COMPLETE COMPARISON:  No prior renal ultrasound. CT the abdomen and pelvis 08/25/2017. FINDINGS: Right Kidney: Length: 10 cm. Echogenicity within normal limits. No mass or hydronephrosis visualized. 7 mm echogenic focus in the interpolar region of the right kidney with some mild posterior acoustic shadowing, compatible with a calculus. Left Kidney: Length: 14.9 cm. Echogenicity within normal limits. Mild to moderate hydronephrosis. No mass visualized. Echogenic focus measuring 6 mm in the lower pole with some posterior acoustic shadowing, compatible with a calculus. Bladder: Under distended and poorly visualized. IMPRESSION: 1. Small nonobstructive calculi in the collecting systems of both kidneys. 2. Mild to moderate hydronephrosis. Electronically Signed   By: Vinnie Langton M.D.   On: 08/30/2017 09:42   Dg Abd Acute W/chest  Result Date: 08/30/2017 CLINICAL DATA:  Shortness of breath on exertion. EXAM: DG ABDOMEN ACUTE W/ 1V CHEST COMPARISON:  None. FINDINGS: There is no evidence of dilated bowel loops or free  intraperitoneal air. Status post cholecystectomy. Surgical clips are seen in the pelvis. Heart size and mediastinal contours are within normal limits. Both lungs are clear. IMPRESSION: No evidence of bowel obstruction or ileus. No acute cardiopulmonary disease. Electronically Signed    By: Marijo Conception, M.D.   On: 08/30/2017 11:26   Dg Hip Unilat With Pelvis 1v Right  Result Date: 09/10/2017 CLINICAL DATA:  Pain aggravated by sitting, fell in bathroom 3 days ago, generalized RIGHT hip pain, midline lower back pain, and bottom pain EXAM: DG HIP (WITH OR WITHOUT PELVIS) 1V RIGHT COMPARISON:  None FINDINGS: Hip and SI joints symmetric and preserved. Bones appear slightly demineralized. Minimal osteitis pubis. No acute fracture, dislocation, or bone destruction. IMPRESSION: No acute osseous abnormalities. Electronically Signed   By: [name] M.D.   On: 09/10/2017 20:45   Ir Nephrostogram Left Initial Placement  Result Date: 09/02/2017 INDICATION: 62 year old female with a history bilateral ureteral obstruction. EXAM: IR NEPHROSTOGRAM INI PLACEMENT RIGHT; IR NEPHROSTOGRAM INI PLACEMENT LEFT COMPARISON:  CT 08/31/2017 MEDICATIONS: Ciprofloxacin; The antibiotic was administered in an appropriate time frame prior to skin puncture. ANESTHESIA/SEDATION: Fentanyl 100 mcg IV; Versed 1.0 mg IV Moderate Sedation Time:  27 minutes The patient was continuously monitored during the procedure by the interventional radiology nurse under my direct supervision. CONTRAST:  20 cc-administered into the collecting system(s) FLUOROSCOPY TIME:  Fluoroscopy Time: 1 minutes 30 seconds (32 mGy). COMPLICATIONS: None PROCEDURE: Informed written consent was obtained from the patient after a thorough discussion of the procedural risks, benefits and alternatives. All questions were addressed. Maximal Sterile Barrier Technique was utilized including caps, mask, sterile gowns, sterile gloves, sterile drape, hand hygiene and skin antiseptic. A timeout was performed prior to the initiation of the procedure. Patient positioned prone position on the fluoroscopy table. The patient was then prepped and draped in the usual sterile fashion. 1% lidocaine was used to anesthetize the skin and subcutaneous tissues for local  anesthesia. Left: Ultrasound survey of the left flank was performed with images stored and sent to PACs. A Chiba needle was then used to access a posterior inferior calyx with ultrasound guidance. With spontaneous urine returned through the needle, passage of an 018 micro wire into the collecting system was performed under fluoroscopy. A small incision was made with an 11 blade scalpel, and the needle was removed from the wire. An Accustick system was then advanced over the wire into the collecting system under fluoroscopy. The metal stiffener and inner dilator were removed, and then a sample of fluid was aspirated through the 4 French outer sheath. Bentson wire was passed into the collecting system and the sheath removed. Ten French dilation of the soft tissues was performed. Using modified Seldinger technique, a 10 French pigtail catheter drain was placed over the Bentson wire. Wire and inner stiffener removed, and the pigtail was formed in the collecting system. Small amount of contrast confirmed position of the catheter. Right: Ultrasound survey of the right flank was performed with images stored and sent to PACs. A Chiba needle was then used to access a posterior inferior calyx with ultrasound guidance. With spontaneous urine returned through the needle, passage of an 018 micro wire into the collecting system was performed under fluoroscopy. A small incision was made with an 11 blade scalpel, and the needle was removed from the wire. An Accustick system was then advanced over the wire into the collecting system under fluoroscopy. The metal stiffener and inner dilator were removed, and then a sample  of fluid was aspirated through the 4 French outer sheath. Bentson wire was passed into the collecting system and the sheath removed. Ten French dilation of the soft tissues was performed. Using modified Seldinger technique, a 10 French pigtail catheter drain was placed over the Bentson wire. Wire and inner stiffener  removed, and the pigtail was formed in the collecting system. Small amount of contrast confirmed position of the catheter. Patient tolerated the procedure well and remained hemodynamically stable throughout. No complications were encountered and no significant blood loss encountered IMPRESSION: Status post bilateral percutaneous nephrostomy. Signed, Dulcy Fanny. Earleen Newport, DO [facility] Electronically Signed   By: Corrie Mckusick D.O.   On: 09/02/2017 17:32   Ir Nephrostogram Right Initial Placement  Result Date: 09/02/2017 INDICATION: 62 year old female with a history bilateral ureteral obstruction. EXAM: IR NEPHROSTOGRAM INI PLACEMENT RIGHT; IR NEPHROSTOGRAM INI PLACEMENT LEFT COMPARISON:  CT 08/31/2017 MEDICATIONS: Ciprofloxacin; The antibiotic was administered in an appropriate time frame prior to skin puncture. ANESTHESIA/SEDATION: Fentanyl 100 mcg IV; Versed 1.0 mg IV Moderate Sedation Time:  27 minutes The patient was continuously monitored during the procedure by the interventional radiology nurse under my direct supervision. CONTRAST:  20 cc-administered into the collecting system(s) FLUOROSCOPY TIME:  Fluoroscopy Time: 1 minutes 30 seconds (32 mGy). COMPLICATIONS: None PROCEDURE: Informed written consent was obtained from the patient after a thorough discussion of the procedural risks, benefits and alternatives. All questions were addressed. Maximal Sterile Barrier Technique was utilized including caps, mask, sterile gowns, sterile gloves, sterile drape, hand hygiene and skin antiseptic. A timeout was performed prior to the initiation of the procedure. Patient positioned prone position on the fluoroscopy table. The patient was then prepped and draped in the usual sterile fashion. 1% lidocaine was used to anesthetize the skin and subcutaneous tissues for local anesthesia. Left: Ultrasound survey of the left flank was performed with images stored and sent  to PACs. A Chiba needle was then used to access a posterior inferior calyx with ultrasound guidance. With spontaneous urine returned through the needle, passage of an 018 micro wire into the collecting system was performed under fluoroscopy. A small incision was made with an 11 blade scalpel, and the needle was removed from the wire. An Accustick system was then advanced over the wire into the collecting system under fluoroscopy. The metal stiffener and inner dilator were removed, and then a sample of fluid was aspirated through the 4 French outer sheath. Bentson wire was passed into the collecting system and the sheath removed. Ten French dilation of the soft tissues was performed. Using modified Seldinger technique, a 10 French pigtail catheter drain was placed over the Bentson wire. Wire and inner stiffener removed, and the pigtail was formed in the collecting system. Small amount of contrast confirmed position of the catheter. Right: Ultrasound survey of the right flank was performed with images stored and sent to PACs. A Chiba needle was then used to access a posterior inferior calyx with ultrasound guidance. With spontaneous urine returned through the needle, passage of an 018 micro wire into the collecting system was performed under fluoroscopy. A small incision was made with an 11 blade scalpel, and the needle was removed from the wire. An Accustick system was then advanced over the wire into the collecting system under fluoroscopy. The metal stiffener and inner dilator were removed, and then a sample of fluid was aspirated through the 4 French outer sheath. Bentson wire was passed into the collecting system and the  sheath removed. Ten French dilation of the soft tissues was performed. Using modified Seldinger technique, a 10 French pigtail catheter drain was placed over the Bentson wire. Wire and inner stiffener removed, and the pigtail was formed in the collecting system. Small amount of contrast confirmed  position of the catheter. Patient tolerated the procedure well and remained hemodynamically stable throughout. No complications were encountered and no significant blood loss encountered IMPRESSION: Status post bilateral percutaneous nephrostomy. Signed, Dulcy Fanny. Earleen Newport, DO [facility] Radiology Electronically Signed   By: Corrie Mckusick D.O.   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 After recent fall, the patient is complaining of significant pain in the right flank region near the site of the indwelling right nephrostomy tube. EXAM: RIGHT NEPHROSTOGRAM COMPARISON:  Imaging at the time of nephrostomy tube placement on 09/02/2017 MEDICATIONS: None ANESTHESIA/SEDATION: None CONTRAST:  5 mL Isovue-300-administered into the collecting system(s) FLUOROSCOPY TIME:  Fluoroscopy Time: 12 seconds.  3.8 mGy. COMPLICATIONS: None immediate. PROCEDURE: Under fluoroscopy, contrast material was injected via the pre-existing right percutaneous nephrostomy tube. Fluoroscopic images were saved as a cine loop. The catheter was flushed and reattached to a gravity drainage bag. FINDINGS: The percutaneous nephrostomy tube is intact without evidence of catheter rupture or contrast extravasation. The catheter is formed at the level of the renal pelvis and is well positioned. No significant filling defects are identified in the renal collecting system or opacified ureter. IMPRESSION: Unremarkable right nephrostogram via pre-existing percutaneous nephrostomy tube demonstrating intact nephrostomy tube which is positioned in the right renal pelvis. Electronically Signed   By: Aletta Edouard M.D.   On: 09/13/2017 16:10   Ct Image Guide Drain Transvag Transrect Peritoneal Retroper  Result  Date: 09/16/2017 INDICATION: 62 year old female with a history of presacral abscess EXAM: CT-GUIDED DRAINAGE PRESACRAL ABSCESS MEDICATIONS: NONE ANESTHESIA/SEDATION: Fentanyl 4.0 mcg IV; Versed 200 mg IV Moderate Sedation Time:  50 MINUTES The patient was continuously monitored during the procedure by the interventional radiology nurse under my direct supervision. COMPLICATIONS: None PROCEDURE: Informed written consent was obtained from the patient after a thorough discussion of the procedural risks, benefits and alternatives. All questions were addressed. Maximal Sterile Barrier Technique was utilized including caps, mask, sterile gowns, sterile gloves, sterile drape, hand hygiene and skin antiseptic. A timeout was performed prior to the initiation of the procedure. Patient position right decubitus and scout CT was obtained. Patient is prepped and draped in usual sterile fashion. 1% lidocaine was used for local anesthesia. Using CT guidance, modified Seldinger technique was used to place a 10 [place] drain into the presacral space. Approximately 3-4 cc of serosanguineous fluid aspirated and sent for culture. Patient tolerated the procedure well and remained hemodynamically stable throughout. No complications were encountered and no significant blood loss. IMPRESSION: Status post CT-guided drainage of presacral abscess. Signed, Dulcy Fanny. Earleen Newport, DO [facility] Radiology Electronically Signed   By: Corrie Mckusick D.O.   On: 09/16/2017 13:16    Micro Results   Recent Results (from the past 240 hour(s))  Aerobic/Anaerobic Culture (surgical/deep wound)     Status: None   Collection Time: 09/16/17 12:14 PM  Result Value Ref Range Status   Specimen Description   Final    ABSCESS SITE NOT SPECIFIED Performed at [facility], [address], [place], [place] [address]    Special Requests   Final    NONE Performed at [facility], [place]  [address], [place], [place] [address]    Gram Stain   Final    ABUNDANT WBC PRESENT, PREDOMINANTLY PMN ABUNDANT GRAM NEGATIVE RODS ABUNDANT GRAM POSITIVE COCCI ABUNDANT GRAM POSITIVE RODS    Culture   Final    MODERATE GROUP B STREP(S.AGALACTIAE)ISOLATED TESTING AGAINST S. AGALACTIAE NOT ROUTINELY PERFORMED DUE TO PREDICTABILITY OF AMP/PEN/VAN SUSCEPTIBILITY. WITHIN MIXED CULTURE NO ANAEROBES ISOLATED Performed at [facility], [place] [address] [place], [place] [address]    Report Status 09/21/2017 FINAL  Final       Today   Subjective    [facility] today has no fevers/no chills , no n/v, had ileostomy bag leak earlier today, RN Olie present at bedside during my evaluation         Patient is eating and drinking well, patient without any new complaints today 09/25/17   Patient has been seen and examined prior to discharge   Objective   Blood pressure (!) 108/54, pulse 76, temperature 98.7 F (37.1 C), temperature source Oral, resp. rate 18, height 5' 2"  (1.575 m), weight 89.7 kg (197 lb 12 oz), SpO2 100 %.   Intake/Output Summary (Last 24 hours) at 09/25/2017 1540 Last data filed at 09/25/2017 1512 Gross per 24 hour  Intake 440 ml  Output 2445 ml  Net -2005 ml    Exam Gen:- Awake Alert,  In no apparent distress  HEENT:- Lydia.AT, No sclera icterus Neck-Supple Neck,No JVD,.  Lungs-  CTAB  CV- S1, S2 normal Abd-  +ve B.Sounds, Abd Soft, bilateral nephrostomy tubes noted with urine (Lt > Rt), ileostomy bag with liquid fecal material Extremity/Skin:- No  edema,    Psych-tearful at times  neuro- no new Focal deficits GU-Foley with clear urine   Data Review   CBC w Diff:  Lab Results  Component Value Date   WBC 13.1 (H) 09/23/2017   HGB 8.9 (L) 09/23/2017   HGB 12.4 07/29/2017   HCT 28.9 (L) 09/23/2017   HCT 38.0 07/29/2017   PLT 303 09/23/2017   PLT 260 07/29/2017   LYMPHOPCT 18 09/13/2017   LYMPHOPCT 7.0 (L) 07/29/2017   MONOPCT 22 09/13/2017    MONOPCT 9.0 07/29/2017   EOSPCT 4 09/13/2017   EOSPCT 0.1 07/29/2017   BASOPCT 1 09/13/2017   BASOPCT 0.2 07/29/2017    CMP:  Lab Results  Component Value Date   NA 137 09/23/2017   NA 141 01/24/2017   K 3.6 09/23/2017   K 4.6 01/24/2017   CL 108 09/23/2017   CO2 17 (L) 09/23/2017   CO2 29 01/24/2017   BUN 30 (H) 09/23/2017   BUN 22.2 01/24/2017   CREATININE 1.85 (H) 09/23/2017   CREATININE 0.8 01/24/2017   PROT 6.9 09/18/2017   PROT 6.9 01/24/2017   ALBUMIN 2.0 (L) 09/18/2017   ALBUMIN 3.4 (L) 01/24/2017   BILITOT 0.2 (L) 09/18/2017   BILITOT 0.34 01/24/2017   ALKPHOS 160 (H) 09/18/2017   ALKPHOS 90 01/24/2017   AST 21 09/18/2017   AST 16 01/24/2017   ALT 21 09/18/2017   ALT 14 01/24/2017  . Disposition-  discharge to [facility] rehab facility on 09/25/2017 Total Discharge time is about 33 minutes  Roxan Hockey M.D on 09/25/2017 at 3:40 PM  [facility]   Office  [phone]  Voice Recognition Viviann Spare dictation system was used to create this note,  attempts have been made to correct errors. Please contact the author with questions and/or clarifications.

## 2017-09-24 NOTE — Clinical Social Work Note (Signed)
Patient medically stable for discharge today and [facility] was chosen facility, with patient/spouse paying privately. Contact made with Claiborne Billings, admissions director regarding patient and discharge paperwork transmitted to facility.   Talked again with Claiborne Billings at approx. 3:21 pm and per Claiborne Billings, she was informed by persons above her that they cannot take patient today or Sunday as they cannot get patient's meds, and they are recommending that patient choose another facility. Consulted with nurse and attending physician regarding this change and CSW was asked to contact facility to determine if they will take patient if meds supplied. Contact made again with Claiborne Billings and the decision remained the same.  CSW talked with patient and husband at the bedside regarding the situation and discussion ensued regarding patient's other bed offers, which had already been provided to patient. [facility] chosen and CSW advised them that [facility] did not made a bed offer.  Interest was also expressed regarding [name].  Contact made with Santiago Glad, admissions director with [facility] regarding patient and they are now declining patient. Mr. Vigeant contacted by phone [phone]) and advised. Patient and spouse were informed that the CSW for Sunday would be updated and make contact with them regarding discharge disposition.   Emeri Estill Givens, MSW, LCSW Licensed Clinical Social Worker [place] [phone]

## 2017-09-24 NOTE — Clinical Social Work Placement (Signed)
   CLINICAL SOCIAL WORK PLACEMENT  NOTE 09/24/17 - DISCHARGED TO [facility]BULANCE  Date:  09/24/2017  Patient Details  Name: [name] MRN: [id number] Date of Birth: 08-Sep-1955  Clinical Social Work is seeking post-discharge placement for this patient at the [place] level of care (*CSW will initial, date and re-position this form in  chart as items are completed):  Yes   Patient/family provided with [place] Work Department's list of facilities offering this level of care within the geographic area requested by the patient (or if unable, by the patient's family).  Yes   Patient/family informed of their freedom to choose among providers that offer the needed level of care, that participate in Medicare, Medicaid or managed care program needed by the patient, have an available bed and are willing to accept the patient.  Yes   Patient/family informed of [place]'s ownership interest in [facility] and [facility], as well as of the fact that they are under no obligation to receive care at these facilities.  PASRR submitted to EDS on 09/24/17     PASRR number received on 09/24/17([phone])     Existing PASRR number confirmed on       FL2 transmitted to all facilities in geographic area requested by pt/family on 09/06/17     FL2 transmitted to all facilities within larger geographic area on       Patient informed that his/her managed care company has contracts with or will negotiate with certain facilities, including the following:        Yes   Patient/family informed of bed offers received.  Patient chooses bed at [facility]     Physician recommends and patient chooses bed at      Patient to be transferred to [facility] on 09/24/17.  Patient to be transferred to facility by Ambulance     Patient family notified on 09/24/17 of transfer.  Name of family member notified:  Sally-Ann Cutbirth, husband by phone [phone]      PHYSICIAN       Additional Comment:    _______________________________________________ Sable Feil, LCSW 09/24/2017, 12:01 PM

## 2017-09-24 NOTE — Progress Notes (Signed)
Called to floor by nurse and Dr. Denton Brick regarding patient's bed placement at [facility] has been rescinded.  I called [name] with admissions at [facility] who initially said the patient's 41 medications was posing a possible patient safety issue for the patient to be transferred to their facility.  I offered to possibly have pharmacy fill the patient's prescriptions and send those with the patient.  [name] stated she would check with her administration and call me back.  When [name] called me back she said that she was told by her administration to "say no to admission" and would not accept the patient even with [place] sending the patient's medication.  To the patient's room with Dr. Denton Brick and [name] Bloodgood RN- explained to patient, husband and female guest at bedside the circumstances of bed being rescinded.  Informed patient and family Case Management and Social Work would be working again to find placement.  Husband had multiple questions regarding pre-authorization, insurance, available facilities, etc- Wellsite geologist of questions.

## 2017-09-25 DIAGNOSIS — N289 Disorder of kidney and ureter, unspecified: Secondary | ICD-10-CM | POA: Diagnosis not present

## 2017-09-25 DIAGNOSIS — T82594D Other mechanical complication of infusion catheter, subsequent encounter: Secondary | ICD-10-CM | POA: Diagnosis not present

## 2017-09-25 DIAGNOSIS — Z452 Encounter for adjustment and management of vascular access device: Secondary | ICD-10-CM | POA: Diagnosis not present

## 2017-09-25 DIAGNOSIS — E860 Dehydration: Secondary | ICD-10-CM | POA: Diagnosis not present

## 2017-09-25 DIAGNOSIS — F321 Major depressive disorder, single episode, moderate: Secondary | ICD-10-CM | POA: Diagnosis not present

## 2017-09-25 DIAGNOSIS — M6281 Muscle weakness (generalized): Secondary | ICD-10-CM | POA: Diagnosis not present

## 2017-09-25 DIAGNOSIS — R278 Other lack of coordination: Secondary | ICD-10-CM | POA: Diagnosis not present

## 2017-09-25 LAB — GLUCOSE, CAPILLARY
Glucose-Capillary: 79 mg/dL (ref 65–99)
Glucose-Capillary: 111 mg/dL — ABNORMAL HIGH (ref 65–99)

## 2017-09-25 NOTE — NC FL2 (Signed)
[place] LEVEL OF CARE SCREENING TOOL     IDENTIFICATION  Patient Name: [name] Birthdate: Apr 25, 1956 Sex: female Admission Date (Current Location): 08/29/2017  [facility] and [place] Number:  Herbalist and Address:  [facility],  [place] [address] [place]      Provider Number: [id number]  Attending Physician Name and Address:  Roxan Hockey, MD  Relative Name and Phone Number:       Current Level of Care: Hospital Recommended Level of Care: [place] Prior Approval Number:    Date Approved/Denied:   PASRR Number: [id number]  Discharge Plan: SNF    Current Diagnoses: Patient Active Problem List   Diagnosis Date Noted  . MDD (major depressive disorder), single episode, moderate ([place])   . Poorly controlled diabetes mellitus ([name]) 08/31/2017  . Pressure injury of skin 08/31/2017  . 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 History of external beam radiation therapy to pelvis 08/31/2017  . ARF (acute renal failure) ([place]) 08/30/2017  . Adjustment disorder with mixed anxiety and depressed mood 08/30/2017  . Dehydration 08/29/2017  . Chronic kidney disease (CKD), stage III (moderate) ([place]) 08/24/2017  . Wound of right buttock 08/10/2017  . 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[place]) 06/20/2017  . Protein-calorie malnutrition, moderate ([place]) 04/18/2017  . Postoperative anemia 04/04/2017  . 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 Hot flashes related to aromatase inhibitor therapy 03/18/2017  . 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 Vitamin D insufficiency 08/30/2016  . Breast cancer of upper-inner quadrant of left female breast ([place])   . Pelvic pain in female 03/19/2015  . Chronic anemia 12/30/2014  . UTI (urinary tract infection)   .  Pancytopenia, acquired ([place]) 11/26/2014  . Morbid obesity ([place]) 11/20/2014  . 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 Abdominal pain 09/09/2014  . Postmenopausal bleeding 09/05/2014  . History of ovarian & endometrial cancer 07/17/2012  . 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[place]) 05/10/2012  . Chronic neutropenia ([place]) 12/14/2011  . Primary hypothyroidism 12/14/2011  . DM type 2 (diabetes mellitus, type 2) ([place]) 12/14/2011  . Benign essential HTN 12/14/2011    Orientation RESPIRATION BLADDER Height & Weight     Self, Time, Situation, Place  Normal Indwelling catheter Weight: 197 lb 12 oz (89.7 kg) Height:  5\' 2"  (157.5 cm)  BEHAVIORAL SYMPTOMS/MOOD NEUROLOGICAL BOWEL NUTRITION STATUS      Ileostomy Diet(See Discharge Summary )  AMBULATORY STATUS COMMUNICATION OF NEEDS Skin   Limited Assist Verbally PU Stage and Appropriate Care                       Personal Care Assistance Level of Assistance  Bathing, Feeding, Dressing Bathing Assistance: Maximum assistance Feeding assistance: Independent Dressing Assistance: Maximum assistance     Functional Limitations Info  Sight, Hearing, Speech Sight Info: Adequate Hearing Info: Adequate Speech Info: Adequate    SPECIAL CARE FACTORS FREQUENCY  PT (By licensed PT), OT (By licensed OT)     PT Frequency: 5x/week  OT Frequency: 5x/week             Contractures Contractures Info: Not present    Additional Factors Info  Code Status, Allergies, Psychotropic, Insulin Sliding Scale Code Status Info: Fullcode Allergies Info: Allergies: Penicillins, Ultram Tramadol, Adhesive Tape, Cefaclor, Erythromycin, Trimethoprim, Ciprofloxacin, Fluconazole,  Oxycodone, Pectin, Sulfa Antibiotics   Insulin Sliding Scale Info: See Medicatin List        Current Medications (09/25/2017):  This is the current hospital active medication list Current  Facility-Administered Medications  Medication Dose Route Frequency Provider Last Rate Last Dose  . acetaminophen (TYLENOL) tablet 1,000 mg  1,000 mg Oral TID WC & HS Michael [name], MD   1,000 mg at 09/25/17 1339  . ALPRAZolam Duanne Moron) tablet 0.5 mg  0.5 mg Oral TID PRN Theodis Blaze, MD      . alum & mag hydroxide-simeth (MAALOX/MYLANTA) 200-200-20 MG/5ML suspension 30 mL  30 mL Oral Q6H PRN Michael [name], MD   30 mL at 09/17/17 0047  . anastrozole (ARIMIDEX) tablet 1 mg  1 mg Oral Daily Michael [name], MD   1 mg at 09/25/17 1136  . artificial tears (LACRILUBE) ophthalmic ointment   Both Eyes Ardeen Fillers, MD      . cholecalciferol (VITAMIN D) tablet 10,000 Units  10,000 Units Oral Weekly Michael [name], MD   10,000 Units at 09/04/17 1650  . ciprofloxacin (CIPRO) tablet 500 mg  500 mg Oral BID Adrian Saran, RPH   500 mg at 09/25/17 1137  . diphenhydrAMINE (BENADRYL) capsule 25 mg  25 mg Oral Q6H PRN Theodis Blaze, MD      . DULoxetine (CYMBALTA) DR capsule 30 mg  30 mg Oral BID Roxan Hockey, MD   30 mg at 09/25/17 1136  . enoxaparin (LOVENOX) injection 30 mg  30 mg Subcutaneous Q24H Allred, Darrell K, PA-C   30 mg at 09/24/17 2114  . famotidine (PEPCID) tablet 20 mg  20 mg Oral Daily Michael [name], MD   20 mg at 09/25/17 1138  . fentaNYL (DURAGESIC - dosed mcg/hr) 75 mcg  75 mcg Transdermal Q72H Micheline Rough, MD   75 mcg at 09/25/17 1343  . fentaNYL (SUBLIMAZE) injection 25-50 mcg  25-50 mcg Intravenous Q3H PRN Domingo Cocking, Gene, MD      . ferrous sulfate tablet 325 mg  325 mg Oral Ardeen Fillers, MD   325 mg at 09/24/17 2116  . Gerhardt's butt cream   Topical QID Emokpae, Courage, MD      . guaiFENesin-dextromethorphan (ROBITUSSIN DM) 100-10 MG/5ML syrup 10 mL  10 mL Oral Q4H PRN Michael [name], MD   10 mL at 09/01/17 2135  . HYDROmorphone (DILAUDID) tablet 6 mg  6 mg Oral Q3H PRN Micheline Rough, MD   6 mg at 09/25/17 1138  . insulin aspart (novoLOG) injection 0-20 Units  0-20 Units  Subcutaneous TID WC Patrecia Pour, MD   4 Units at 09/24/17 1230  . insulin aspart (novoLOG) injection 0-5 Units  0-5 Units Subcutaneous QHS Patrecia Pour, MD   2 Units at 09/18/17 2225  . insulin aspart (novoLOG) injection 7 Units  7 Units Subcutaneous TID WC Patrecia Pour, MD   7 Units at 09/24/17 1231  . insulin glargine (LANTUS) injection 25 Units  25 Units Subcutaneous BID Patrecia Pour, MD   25 Units at 09/25/17 1137  . levothyroxine (SYNTHROID, LEVOTHROID) tablet 150 mcg  150 mcg Oral QAC breakfast Michael [name], MD   150 mcg at 09/25/17 0840  . lidocaine (LIDODERM) 5 % 1 patch  1 patch Transdermal Q24H Michael [name], MD   1 patch at 09/25/17 [phone]  . linagliptin (TRADJENTA) tablet 5 mg  5 mg Oral Daily Michael [name], MD   5 mg at 09/25/17 1138  . lip balm (CARMEX)  ointment 1 application  1 application Topical BID Michael [name], MD   1 application at 52/77/82 1140  . loperamide (IMODIUM) capsule 2-4 mg  2-4 mg Oral Q8H PRN Michael [name], MD      . methocarbamol (ROBAXIN) tablet 1,000 mg  1,000 mg Oral Q6H PRN Michael [name], MD   1,000 mg at 09/25/17 4235  . metoCLOPramide (REGLAN) tablet 5 mg  5 mg Oral Q6H PRN Michael [name], MD   5 mg at 09/16/17 2218  . polyvinyl alcohol (LIQUIFILM TEARS) 1.4 % ophthalmic solution 1 drop  1 drop Both Eyes Ardeen Fillers, MD   1 drop at 09/24/17 2118  . pregabalin (LYRICA) capsule 75 mg  75 mg Oral BID Micheline Rough, MD   75 mg at 09/25/17 1135  . prenatal multivitamin tablet 1 tablet  1 tablet Oral Daily Michael [name], MD   1 tablet at 09/25/17 1136  . prochlorperazine (COMPAZINE) injection 10 mg  10 mg Intravenous Q4H PRN Barton Dubois, MD   10 mg at 09/15/17 2011  . protein supplement (PREMIER PROTEIN) liquid  11 oz Oral BID PRN Theodis Blaze, MD      . psyllium (HYDROCIL/METAMUCIL) packet 1 packet  1 packet Oral BID Michael [name], MD   1 packet at 09/24/17 2115  . rosuvastatin (CRESTOR) tablet 10 mg  10 mg Oral QPM Michael [name], MD   10 mg at  09/24/17 1821  . sertraline (ZOLOFT) tablet 75 mg  75 mg Oral Daily Emokpae, Courage, MD   75 mg at 09/25/17 1138  . sodium chloride flush (NS) 0.9 % injection 10-40 mL  10-40 mL Intracatheter PRN Michael [name], MD   10 mL at 09/21/17 3614  . sodium chloride flush (NS) 0.9 % injection 5 mL  5 mL Intracatheter Q8H Corrie Mckusick, DO   5 mL at 09/25/17 4315  . Tbo-Filgrastim (GRANIX) injection 480 mcg  480 mcg Subcutaneous 6 days Pickenpack-Cousar, Carlena Sax, NP   480 mcg at 09/22/17 1920  . traZODone (DESYREL) tablet 50 mg  50 mg Oral QHS PRN Damita Lack, MD   50 mg at 09/19/17 2238     Discharge Medications: Please see discharge summary for a list of discharge medications.  Relevant Imaging Results:  Relevant Lab Results:   Additional Information ssn: [id number]  Estanislado Emms, LCSW

## 2017-09-25 NOTE — Clinical Social Work Placement (Signed)
   CLINICAL SOCIAL WORK PLACEMENT  NOTE  Date:  09/25/2017  Patient Details  Name: [name] MRN: [id number] Date of Birth: [birth year]  Clinical Social Work is seeking post-discharge placement for this patient at the [place] level of care (*CSW will initial, date and re-position this form in  chart as items are completed):  Yes   Patient/family provided with [place] Work Department's list of facilities offering this level of care within the geographic area requested by the patient (or if unable, by the patient's family).  Yes   Patient/family informed of their freedom to choose among providers that offer the needed level of care, that participate in Medicare, Medicaid or managed care program needed by the patient, have an available bed and are willing to accept the patient.  Yes   Patient/family informed of [facility]'s ownership interest in [facility] and [facility], as well as of the fact that they are under no obligation to receive care at these facilities.  PASRR submitted to EDS on 09/24/17     PASRR number received on 09/24/[phone])     Existing PASRR number confirmed on       FL2 transmitted to all facilities in geographic area requested by pt/family on 09/06/17     FL2 transmitted to all facilities within larger geographic area on       Patient informed that his/her managed care company has contracts with or will negotiate with certain facilities, including the following:  [place]     Yes   Patient/family informed of bed offers received.  Patient chooses bed at [facility]     Physician recommends and patient chooses bed at      Patient to be transferred to [facility] on 09/25/17.  Patient to be transferred to facility by [facility]     Patient family notified on 09/25/17 of transfer.  Name of family member notified:  Dierdre Mccalip, spouse [phone]     PHYSICIAN       Additional Comment:     _______________________________________________ Estanislado Emms, LCSW 09/25/2017, 3:06 PM

## 2017-09-25 NOTE — Progress Notes (Signed)
Assessment unchanged. Pt and family agreed and verbalized understanding of transfer to [facility] and has copy of AVS. SW provided packet for EMS to give facility upon arrival. [facility] staff  X 2 transported pt via stretcher to awaiting ambulance for transport to [facility]

## 2017-09-25 NOTE — Progress Notes (Signed)
As of 1655, several attempts made to call and give report regarding pt's status/condition and transport to facility without success. Prior to pt leaving EMS paramedic reported he would tell them to call me in case I had trouble reaching them.

## 2017-09-25 NOTE — Progress Notes (Signed)
Patient will discharge to [facility]. Anticipated discharge date: 09/25/16 Family notified: [name], spouse Transportation by: [facility]  Nurse to call report to [phone]. Patient will go to room 1202 at the facility.   CSW signing off.  Estanislado Emms, [place]  Clinical Social Worker

## 2017-09-25 NOTE — Progress Notes (Addendum)
3:15 [name] will accept patient today.  2:45 CSW called to follow up with patient's preferred facilities. Patient and spouse indicated [name] offered bed last week but now bed not available. [name] has not returned call. [name] indicaed they cannot offer a bed. [name] has not returned call. CSW met with patient and spouse at bedside and explained options. [facility] can private pay at [name], pending [name] director's approval. CSW also having [facility] and [name]. Patient and spouse decline [facility] and [facility], however. CSW awaiting [name] response on approval.  1:08 pm CSW has met with patient at bedside x3 this morning and afternoon. CSW has discussed the options for discharge planning. CSW reviewed SNF bed offers and payment situation. Patient does not have a selected facility and therefore facility has not been able to start insurance authorization. [facility] prefers [name], however these facilities rescinded their bed offers. CSW has left messages with admissions at these facilities and awaiting call back, as patient would like to know the reason offers rescinded.   [name]. Patient declines all SNFs from this list but [name]. [name] can take patient if patient pays 30 days up front (approx $8000) and if she brings her Neupogen prescription from home. CSW informed patient of this option. Patient declines to make decision until her husband arrives from church, as he will know about their financial ability to private pay.  CSW to follow and support with disposition planning.  Estanislado Emms, [place]

## 2017-09-27 ENCOUNTER — Encounter: Payer: Self-pay | Admitting: General Practice

## 2017-09-27 NOTE — Progress Notes (Signed)
[facility] Spiritual Care Note  Mailed Keimya a handwritten note of care and encouragement.   [place], [place], [facility] Pager [phone] Voicemail [phone]

## 2017-09-28 DIAGNOSIS — M6281 Muscle weakness (generalized): Secondary | ICD-10-CM | POA: Diagnosis not present

## 2017-09-28 DIAGNOSIS — G894 Chronic pain syndrome: Secondary | ICD-10-CM | POA: Diagnosis not present

## 2017-09-30 DIAGNOSIS — N179 Acute kidney failure, unspecified: Secondary | ICD-10-CM | POA: Diagnosis not present

## 2017-09-30 DIAGNOSIS — N136 Pyonephrosis: Secondary | ICD-10-CM | POA: Diagnosis not present

## 2017-09-30 DIAGNOSIS — T83592A Infection and inflammatory reaction due to indwelling ureteral stent, initial encounter: Secondary | ICD-10-CM | POA: Diagnosis not present

## 2017-09-30 DIAGNOSIS — Z9049 Acquired absence of other specified parts of digestive tract: Secondary | ICD-10-CM | POA: Diagnosis not present

## 2017-09-30 DIAGNOSIS — Z8543 Personal history of malignant neoplasm of ovary: Secondary | ICD-10-CM | POA: Diagnosis not present

## 2017-09-30 DIAGNOSIS — R0989 Other specified symptoms and signs involving the circulatory and respiratory systems: Secondary | ICD-10-CM | POA: Diagnosis not present

## 2017-09-30 DIAGNOSIS — F0631 Mood disorder due to known physiological condition with depressive features: Secondary | ICD-10-CM | POA: Diagnosis not present

## 2017-09-30 DIAGNOSIS — Z452 Encounter for adjustment and management of vascular access device: Secondary | ICD-10-CM | POA: Diagnosis not present

## 2017-09-30 DIAGNOSIS — L89153 Pressure ulcer of sacral region, stage 3: Secondary | ICD-10-CM | POA: Diagnosis not present

## 2017-09-30 DIAGNOSIS — N135 Crossing vessel and stricture of ureter without hydronephrosis: Secondary | ICD-10-CM | POA: Diagnosis not present

## 2017-09-30 DIAGNOSIS — G8929 Other chronic pain: Secondary | ICD-10-CM | POA: Diagnosis present

## 2017-09-30 DIAGNOSIS — R0600 Dyspnea, unspecified: Secondary | ICD-10-CM | POA: Diagnosis not present

## 2017-09-30 DIAGNOSIS — N183 Chronic kidney disease, stage 3 (moderate): Secondary | ICD-10-CM | POA: Diagnosis not present

## 2017-09-30 DIAGNOSIS — S31819A Unspecified open wound of right buttock, initial encounter: Secondary | ICD-10-CM | POA: Diagnosis not present

## 2017-09-30 DIAGNOSIS — R000 Tachycardia, unspecified: Secondary | ICD-10-CM | POA: Diagnosis not present

## 2017-09-30 DIAGNOSIS — T83593A Infection and inflammatory reaction due to other urinary stents, initial encounter: Secondary | ICD-10-CM | POA: Diagnosis not present

## 2017-09-30 DIAGNOSIS — F418 Other specified anxiety disorders: Secondary | ICD-10-CM | POA: Diagnosis not present

## 2017-09-30 DIAGNOSIS — Z923 Personal history of irradiation: Secondary | ICD-10-CM | POA: Diagnosis not present

## 2017-09-30 DIAGNOSIS — Z7189 Other specified counseling: Secondary | ICD-10-CM | POA: Diagnosis not present

## 2017-09-30 DIAGNOSIS — Z936 Other artificial openings of urinary tract status: Secondary | ICD-10-CM | POA: Diagnosis not present

## 2017-09-30 DIAGNOSIS — Z90722 Acquired absence of ovaries, bilateral: Secondary | ICD-10-CM | POA: Diagnosis not present

## 2017-09-30 DIAGNOSIS — Z90710 Acquired absence of both cervix and uterus: Secondary | ICD-10-CM | POA: Diagnosis not present

## 2017-09-30 DIAGNOSIS — L8990 Pressure ulcer of unspecified site, unspecified stage: Secondary | ICD-10-CM | POA: Diagnosis not present

## 2017-09-30 DIAGNOSIS — R410 Disorientation, unspecified: Secondary | ICD-10-CM | POA: Diagnosis present

## 2017-09-30 DIAGNOSIS — I129 Hypertensive chronic kidney disease with stage 1 through stage 4 chronic kidney disease, or unspecified chronic kidney disease: Secondary | ICD-10-CM | POA: Diagnosis not present

## 2017-09-30 DIAGNOSIS — R1032 Left lower quadrant pain: Secondary | ICD-10-CM | POA: Diagnosis not present

## 2017-09-30 DIAGNOSIS — R404 Transient alteration of awareness: Secondary | ICD-10-CM | POA: Diagnosis not present

## 2017-09-30 DIAGNOSIS — E440 Moderate protein-calorie malnutrition: Secondary | ICD-10-CM | POA: Diagnosis not present

## 2017-09-30 DIAGNOSIS — I878 Other specified disorders of veins: Secondary | ICD-10-CM | POA: Diagnosis not present

## 2017-09-30 DIAGNOSIS — G894 Chronic pain syndrome: Secondary | ICD-10-CM | POA: Diagnosis not present

## 2017-09-30 DIAGNOSIS — L89152 Pressure ulcer of sacral region, stage 2: Secondary | ICD-10-CM | POA: Diagnosis not present

## 2017-09-30 DIAGNOSIS — R278 Other lack of coordination: Secondary | ICD-10-CM | POA: Diagnosis not present

## 2017-09-30 DIAGNOSIS — E039 Hypothyroidism, unspecified: Secondary | ICD-10-CM | POA: Diagnosis present

## 2017-09-30 DIAGNOSIS — Z8544 Personal history of malignant neoplasm of other female genital organs: Secondary | ICD-10-CM | POA: Diagnosis not present

## 2017-09-30 DIAGNOSIS — T82594D Other mechanical complication of infusion catheter, subsequent encounter: Secondary | ICD-10-CM | POA: Diagnosis not present

## 2017-09-30 DIAGNOSIS — Z8542 Personal history of malignant neoplasm of other parts of uterus: Secondary | ICD-10-CM | POA: Diagnosis not present

## 2017-09-30 DIAGNOSIS — Z87448 Personal history of other diseases of urinary system: Secondary | ICD-10-CM | POA: Diagnosis not present

## 2017-09-30 DIAGNOSIS — Z932 Ileostomy status: Secondary | ICD-10-CM | POA: Diagnosis not present

## 2017-09-30 DIAGNOSIS — M6281 Muscle weakness (generalized): Secondary | ICD-10-CM | POA: Diagnosis not present

## 2017-09-30 DIAGNOSIS — R0682 Tachypnea, not elsewhere classified: Secondary | ICD-10-CM | POA: Diagnosis not present

## 2017-09-30 DIAGNOSIS — X58XXXA Exposure to other specified factors, initial encounter: Secondary | ICD-10-CM | POA: Diagnosis not present

## 2017-09-30 DIAGNOSIS — N99528 Other complication of other external stoma of urinary tract: Secondary | ICD-10-CM | POA: Diagnosis not present

## 2017-09-30 DIAGNOSIS — T148XXA Other injury of unspecified body region, initial encounter: Secondary | ICD-10-CM | POA: Diagnosis not present

## 2017-09-30 DIAGNOSIS — F5102 Adjustment insomnia: Secondary | ICD-10-CM | POA: Diagnosis not present

## 2017-09-30 DIAGNOSIS — N1330 Unspecified hydronephrosis: Secondary | ICD-10-CM | POA: Diagnosis not present

## 2017-09-30 DIAGNOSIS — Z515 Encounter for palliative care: Secondary | ICD-10-CM | POA: Diagnosis not present

## 2017-09-30 DIAGNOSIS — N189 Chronic kidney disease, unspecified: Secondary | ICD-10-CM | POA: Diagnosis not present

## 2017-09-30 DIAGNOSIS — N1339 Other hydronephrosis: Secondary | ICD-10-CM | POA: Diagnosis not present

## 2017-09-30 DIAGNOSIS — Z960 Presence of urogenital implants: Secondary | ICD-10-CM | POA: Diagnosis not present

## 2017-09-30 DIAGNOSIS — E11319 Type 2 diabetes mellitus with unspecified diabetic retinopathy without macular edema: Secondary | ICD-10-CM | POA: Diagnosis not present

## 2017-09-30 DIAGNOSIS — N3945 Continuous leakage: Secondary | ICD-10-CM | POA: Diagnosis not present

## 2017-09-30 DIAGNOSIS — N321 Vesicointestinal fistula: Secondary | ICD-10-CM | POA: Diagnosis not present

## 2017-09-30 DIAGNOSIS — R4182 Altered mental status, unspecified: Secondary | ICD-10-CM | POA: Diagnosis not present

## 2017-09-30 DIAGNOSIS — N390 Urinary tract infection, site not specified: Secondary | ICD-10-CM | POA: Diagnosis not present

## 2017-09-30 DIAGNOSIS — T83022A Displacement of nephrostomy catheter, initial encounter: Secondary | ICD-10-CM | POA: Diagnosis not present

## 2017-09-30 DIAGNOSIS — R531 Weakness: Secondary | ICD-10-CM | POA: Diagnosis not present

## 2017-09-30 DIAGNOSIS — G9341 Metabolic encephalopathy: Secondary | ICD-10-CM | POA: Diagnosis not present

## 2017-09-30 DIAGNOSIS — B9562 Methicillin resistant Staphylococcus aureus infection as the cause of diseases classified elsewhere: Secondary | ICD-10-CM | POA: Diagnosis not present

## 2017-09-30 DIAGNOSIS — E119 Type 2 diabetes mellitus without complications: Secondary | ICD-10-CM | POA: Diagnosis not present

## 2017-09-30 DIAGNOSIS — I10 Essential (primary) hypertension: Secondary | ICD-10-CM | POA: Diagnosis not present

## 2017-09-30 DIAGNOSIS — Y732 Prosthetic and other implants, materials and accessory gastroenterology and urology devices associated with adverse incidents: Secondary | ICD-10-CM | POA: Diagnosis present

## 2017-09-30 DIAGNOSIS — T8579XA Infection and inflammatory reaction due to other internal prosthetic devices, implants and grafts, initial encounter: Secondary | ICD-10-CM | POA: Diagnosis not present

## 2017-09-30 DIAGNOSIS — A4102 Sepsis due to Methicillin resistant Staphylococcus aureus: Secondary | ICD-10-CM | POA: Diagnosis not present

## 2017-09-30 DIAGNOSIS — Z436 Encounter for attention to other artificial openings of urinary tract: Secondary | ICD-10-CM | POA: Diagnosis not present

## 2017-09-30 DIAGNOSIS — E872 Acidosis: Secondary | ICD-10-CM | POA: Diagnosis not present

## 2017-09-30 DIAGNOSIS — D709 Neutropenia, unspecified: Secondary | ICD-10-CM | POA: Diagnosis not present

## 2017-09-30 DIAGNOSIS — E782 Mixed hyperlipidemia: Secondary | ICD-10-CM | POA: Diagnosis present

## 2017-09-30 DIAGNOSIS — E1122 Type 2 diabetes mellitus with diabetic chronic kidney disease: Secondary | ICD-10-CM | POA: Diagnosis not present

## 2017-10-01 DIAGNOSIS — N179 Acute kidney failure, unspecified: Secondary | ICD-10-CM | POA: Diagnosis not present

## 2017-10-01 DIAGNOSIS — N183 Chronic kidney disease, stage 3 (moderate): Secondary | ICD-10-CM | POA: Diagnosis not present

## 2017-10-01 DIAGNOSIS — N390 Urinary tract infection, site not specified: Secondary | ICD-10-CM | POA: Diagnosis not present

## 2017-10-01 DIAGNOSIS — I129 Hypertensive chronic kidney disease with stage 1 through stage 4 chronic kidney disease, or unspecified chronic kidney disease: Secondary | ICD-10-CM | POA: Diagnosis not present

## 2017-10-02 ENCOUNTER — Inpatient Hospital Stay (HOSPITAL_COMMUNITY): Payer: BLUE CROSS/BLUE SHIELD

## 2017-10-02 ENCOUNTER — Emergency Department (HOSPITAL_COMMUNITY): Payer: BLUE CROSS/BLUE SHIELD

## 2017-10-02 ENCOUNTER — Encounter (HOSPITAL_COMMUNITY): Payer: Self-pay

## 2017-10-02 ENCOUNTER — Inpatient Hospital Stay (HOSPITAL_COMMUNITY)
Admission: EM | Admit: 2017-10-02 | Discharge: 2017-10-13 | DRG: 698 | Disposition: A | Discharge status: Skilled Nursing Facility | Payer: BLUE CROSS/BLUE SHIELD | Attending: Internal Medicine | Admitting: Internal Medicine

## 2017-10-02 DIAGNOSIS — Z8542 Personal history of malignant neoplasm of other parts of uterus: Secondary | ICD-10-CM | POA: Diagnosis not present

## 2017-10-02 DIAGNOSIS — Z87448 Personal history of other diseases of urinary system: Secondary | ICD-10-CM | POA: Diagnosis not present

## 2017-10-02 DIAGNOSIS — Z800 Family history of malignant neoplasm of digestive organs: Secondary | ICD-10-CM

## 2017-10-02 DIAGNOSIS — Z807 Family history of other malignant neoplasms of lymphoid, hematopoietic and related tissues: Secondary | ICD-10-CM

## 2017-10-02 DIAGNOSIS — Z933 Colostomy status: Secondary | ICD-10-CM | POA: Diagnosis not present

## 2017-10-02 DIAGNOSIS — Y732 Prosthetic and other implants, materials and accessory gastroenterology and urology devices associated with adverse incidents: Secondary | ICD-10-CM | POA: Diagnosis present

## 2017-10-02 DIAGNOSIS — I129 Hypertensive chronic kidney disease with stage 1 through stage 4 chronic kidney disease, or unspecified chronic kidney disease: Secondary | ICD-10-CM | POA: Diagnosis not present

## 2017-10-02 DIAGNOSIS — E669 Obesity, unspecified: Secondary | ICD-10-CM | POA: Diagnosis present

## 2017-10-02 DIAGNOSIS — Z436 Encounter for attention to other artificial openings of urinary tract: Secondary | ICD-10-CM

## 2017-10-02 DIAGNOSIS — Z936 Other artificial openings of urinary tract status: Secondary | ICD-10-CM

## 2017-10-02 DIAGNOSIS — X58XXXA Exposure to other specified factors, initial encounter: Secondary | ICD-10-CM | POA: Diagnosis not present

## 2017-10-02 DIAGNOSIS — N136 Pyonephrosis: Secondary | ICD-10-CM | POA: Diagnosis present

## 2017-10-02 DIAGNOSIS — Z794 Long term (current) use of insulin: Secondary | ICD-10-CM

## 2017-10-02 DIAGNOSIS — Z960 Presence of urogenital implants: Secondary | ICD-10-CM

## 2017-10-02 DIAGNOSIS — R278 Other lack of coordination: Secondary | ICD-10-CM | POA: Diagnosis not present

## 2017-10-02 DIAGNOSIS — Z8543 Personal history of malignant neoplasm of ovary: Secondary | ICD-10-CM | POA: Diagnosis not present

## 2017-10-02 DIAGNOSIS — Z6836 Body mass index (BMI) 36.0-36.9, adult: Secondary | ICD-10-CM

## 2017-10-02 DIAGNOSIS — E11319 Type 2 diabetes mellitus with unspecified diabetic retinopathy without macular edema: Secondary | ICD-10-CM | POA: Diagnosis present

## 2017-10-02 DIAGNOSIS — Z79811 Long term (current) use of aromatase inhibitors: Secondary | ICD-10-CM

## 2017-10-02 DIAGNOSIS — R0682 Tachypnea, not elsewhere classified: Secondary | ICD-10-CM

## 2017-10-02 DIAGNOSIS — E782 Mixed hyperlipidemia: Secondary | ICD-10-CM | POA: Diagnosis present

## 2017-10-02 DIAGNOSIS — N135 Crossing vessel and stricture of ureter without hydronephrosis: Secondary | ICD-10-CM | POA: Diagnosis not present

## 2017-10-02 DIAGNOSIS — G8929 Other chronic pain: Secondary | ICD-10-CM | POA: Diagnosis present

## 2017-10-02 DIAGNOSIS — L89152 Pressure ulcer of sacral region, stage 2: Secondary | ICD-10-CM | POA: Diagnosis present

## 2017-10-02 DIAGNOSIS — R0989 Other specified symptoms and signs involving the circulatory and respiratory systems: Secondary | ICD-10-CM

## 2017-10-02 DIAGNOSIS — E440 Moderate protein-calorie malnutrition: Secondary | ICD-10-CM | POA: Diagnosis present

## 2017-10-02 DIAGNOSIS — N3945 Continuous leakage: Secondary | ICD-10-CM | POA: Diagnosis not present

## 2017-10-02 DIAGNOSIS — Z803 Family history of malignant neoplasm of breast: Secondary | ICD-10-CM

## 2017-10-02 DIAGNOSIS — B9562 Methicillin resistant Staphylococcus aureus infection as the cause of diseases classified elsewhere: Secondary | ICD-10-CM | POA: Diagnosis not present

## 2017-10-02 DIAGNOSIS — T8579XA Infection and inflammatory reaction due to other internal prosthetic devices, implants and grafts, initial encounter: Secondary | ICD-10-CM | POA: Diagnosis not present

## 2017-10-02 DIAGNOSIS — E039 Hypothyroidism, unspecified: Secondary | ICD-10-CM | POA: Diagnosis present

## 2017-10-02 DIAGNOSIS — N189 Chronic kidney disease, unspecified: Secondary | ICD-10-CM | POA: Diagnosis not present

## 2017-10-02 DIAGNOSIS — R410 Disorientation, unspecified: Secondary | ICD-10-CM

## 2017-10-02 DIAGNOSIS — Z8249 Family history of ischemic heart disease and other diseases of the circulatory system: Secondary | ICD-10-CM

## 2017-10-02 DIAGNOSIS — Z8544 Personal history of malignant neoplasm of other female genital organs: Secondary | ICD-10-CM

## 2017-10-02 DIAGNOSIS — E872 Acidosis, unspecified: Secondary | ICD-10-CM | POA: Diagnosis present

## 2017-10-02 DIAGNOSIS — Z932 Ileostomy status: Secondary | ICD-10-CM

## 2017-10-02 DIAGNOSIS — N321 Vesicointestinal fistula: Secondary | ICD-10-CM | POA: Diagnosis not present

## 2017-10-02 DIAGNOSIS — Z515 Encounter for palliative care: Secondary | ICD-10-CM | POA: Diagnosis not present

## 2017-10-02 DIAGNOSIS — Z90710 Acquired absence of both cervix and uterus: Secondary | ICD-10-CM | POA: Diagnosis not present

## 2017-10-02 DIAGNOSIS — E876 Hypokalemia: Secondary | ICD-10-CM | POA: Diagnosis not present

## 2017-10-02 DIAGNOSIS — T83592A Infection and inflammatory reaction due to indwelling ureteral stent, initial encounter: Secondary | ICD-10-CM | POA: Diagnosis not present

## 2017-10-02 DIAGNOSIS — M6281 Muscle weakness (generalized): Secondary | ICD-10-CM | POA: Diagnosis not present

## 2017-10-02 DIAGNOSIS — T148XXA Other injury of unspecified body region, initial encounter: Secondary | ICD-10-CM | POA: Diagnosis not present

## 2017-10-02 DIAGNOSIS — S31819A Unspecified open wound of right buttock, initial encounter: Secondary | ICD-10-CM | POA: Diagnosis not present

## 2017-10-02 DIAGNOSIS — Z452 Encounter for adjustment and management of vascular access device: Secondary | ICD-10-CM

## 2017-10-02 DIAGNOSIS — R1032 Left lower quadrant pain: Secondary | ICD-10-CM | POA: Diagnosis not present

## 2017-10-02 DIAGNOSIS — E860 Dehydration: Secondary | ICD-10-CM | POA: Diagnosis present

## 2017-10-02 DIAGNOSIS — N1339 Other hydronephrosis: Secondary | ICD-10-CM | POA: Diagnosis not present

## 2017-10-02 DIAGNOSIS — Z91048 Other nonmedicinal substance allergy status: Secondary | ICD-10-CM

## 2017-10-02 DIAGNOSIS — Z9049 Acquired absence of other specified parts of digestive tract: Secondary | ICD-10-CM | POA: Diagnosis not present

## 2017-10-02 DIAGNOSIS — D709 Neutropenia, unspecified: Secondary | ICD-10-CM | POA: Diagnosis present

## 2017-10-02 DIAGNOSIS — T83022A Displacement of nephrostomy catheter, initial encounter: Secondary | ICD-10-CM | POA: Diagnosis not present

## 2017-10-02 DIAGNOSIS — R4182 Altered mental status, unspecified: Secondary | ICD-10-CM | POA: Diagnosis not present

## 2017-10-02 DIAGNOSIS — L89319 Pressure ulcer of right buttock, unspecified stage: Secondary | ICD-10-CM | POA: Diagnosis not present

## 2017-10-02 DIAGNOSIS — R109 Unspecified abdominal pain: Secondary | ICD-10-CM | POA: Diagnosis present

## 2017-10-02 DIAGNOSIS — D631 Anemia in chronic kidney disease: Secondary | ICD-10-CM | POA: Diagnosis present

## 2017-10-02 DIAGNOSIS — Z923 Personal history of irradiation: Secondary | ICD-10-CM

## 2017-10-02 DIAGNOSIS — Z0000 Encounter for general adult medical examination without abnormal findings: Secondary | ICD-10-CM

## 2017-10-02 DIAGNOSIS — N99528 Other complication of other external stoma of urinary tract: Secondary | ICD-10-CM | POA: Diagnosis not present

## 2017-10-02 DIAGNOSIS — Z90722 Acquired absence of ovaries, bilateral: Secondary | ICD-10-CM

## 2017-10-02 DIAGNOSIS — I878 Other specified disorders of veins: Secondary | ICD-10-CM | POA: Diagnosis not present

## 2017-10-02 DIAGNOSIS — A4102 Sepsis due to Methicillin resistant Staphylococcus aureus: Secondary | ICD-10-CM | POA: Diagnosis present

## 2017-10-02 DIAGNOSIS — T83593A Infection and inflammatory reaction due to other urinary stents, initial encounter: Secondary | ICD-10-CM | POA: Diagnosis not present

## 2017-10-02 DIAGNOSIS — R8271 Bacteriuria: Secondary | ICD-10-CM | POA: Diagnosis not present

## 2017-10-02 DIAGNOSIS — Z853 Personal history of malignant neoplasm of breast: Secondary | ICD-10-CM

## 2017-10-02 DIAGNOSIS — E119 Type 2 diabetes mellitus without complications: Secondary | ICD-10-CM | POA: Diagnosis not present

## 2017-10-02 DIAGNOSIS — N179 Acute kidney failure, unspecified: Secondary | ICD-10-CM | POA: Diagnosis not present

## 2017-10-02 DIAGNOSIS — Z79890 Hormone replacement therapy (postmenopausal): Secondary | ICD-10-CM

## 2017-10-02 DIAGNOSIS — R000 Tachycardia, unspecified: Secondary | ICD-10-CM

## 2017-10-02 DIAGNOSIS — I10 Essential (primary) hypertension: Secondary | ICD-10-CM | POA: Diagnosis not present

## 2017-10-02 DIAGNOSIS — N1330 Unspecified hydronephrosis: Secondary | ICD-10-CM | POA: Diagnosis not present

## 2017-10-02 DIAGNOSIS — Z9221 Personal history of antineoplastic chemotherapy: Secondary | ICD-10-CM

## 2017-10-02 DIAGNOSIS — G9341 Metabolic encephalopathy: Secondary | ICD-10-CM | POA: Diagnosis not present

## 2017-10-02 DIAGNOSIS — N183 Chronic kidney disease, stage 3 (moderate): Secondary | ICD-10-CM | POA: Diagnosis present

## 2017-10-02 DIAGNOSIS — Z808 Family history of malignant neoplasm of other organs or systems: Secondary | ICD-10-CM

## 2017-10-02 DIAGNOSIS — L89153 Pressure ulcer of sacral region, stage 3: Secondary | ICD-10-CM | POA: Diagnosis present

## 2017-10-02 DIAGNOSIS — Z882 Allergy status to sulfonamides status: Secondary | ICD-10-CM

## 2017-10-02 DIAGNOSIS — E1122 Type 2 diabetes mellitus with diabetic chronic kidney disease: Secondary | ICD-10-CM | POA: Diagnosis not present

## 2017-10-02 DIAGNOSIS — K760 Fatty (change of) liver, not elsewhere classified: Secondary | ICD-10-CM | POA: Diagnosis present

## 2017-10-02 DIAGNOSIS — T82594D Other mechanical complication of infusion catheter, subsequent encounter: Secondary | ICD-10-CM | POA: Diagnosis not present

## 2017-10-02 DIAGNOSIS — Z170 Estrogen receptor positive status [ER+]: Secondary | ICD-10-CM

## 2017-10-02 DIAGNOSIS — Z9013 Acquired absence of bilateral breasts and nipples: Secondary | ICD-10-CM

## 2017-10-02 DIAGNOSIS — Z885 Allergy status to narcotic agent status: Secondary | ICD-10-CM

## 2017-10-02 DIAGNOSIS — R531 Weakness: Secondary | ICD-10-CM | POA: Diagnosis not present

## 2017-10-02 DIAGNOSIS — Z8041 Family history of malignant neoplasm of ovary: Secondary | ICD-10-CM

## 2017-10-02 DIAGNOSIS — T82594A Other mechanical complication of infusion catheter, initial encounter: Secondary | ICD-10-CM

## 2017-10-02 DIAGNOSIS — Z7189 Other specified counseling: Secondary | ICD-10-CM

## 2017-10-02 DIAGNOSIS — L8990 Pressure ulcer of unspecified site, unspecified stage: Secondary | ICD-10-CM | POA: Diagnosis present

## 2017-10-02 DIAGNOSIS — Z881 Allergy status to other antibiotic agents status: Secondary | ICD-10-CM

## 2017-10-02 DIAGNOSIS — Z833 Family history of diabetes mellitus: Secondary | ICD-10-CM

## 2017-10-02 DIAGNOSIS — R918 Other nonspecific abnormal finding of lung field: Secondary | ICD-10-CM | POA: Diagnosis not present

## 2017-10-02 DIAGNOSIS — Z9882 Breast implant status: Secondary | ICD-10-CM

## 2017-10-02 DIAGNOSIS — R0600 Dyspnea, unspecified: Secondary | ICD-10-CM | POA: Diagnosis not present

## 2017-10-02 DIAGNOSIS — Z888 Allergy status to other drugs, medicaments and biological substances status: Secondary | ICD-10-CM

## 2017-10-02 DIAGNOSIS — R599 Enlarged lymph nodes, unspecified: Secondary | ICD-10-CM

## 2017-10-02 DIAGNOSIS — R404 Transient alteration of awareness: Secondary | ICD-10-CM | POA: Diagnosis not present

## 2017-10-02 DIAGNOSIS — Z880 Allergy status to penicillin: Secondary | ICD-10-CM

## 2017-10-02 DIAGNOSIS — N390 Urinary tract infection, site not specified: Secondary | ICD-10-CM

## 2017-10-02 HISTORY — PX: IR NEPHROSTOMY PLACEMENT LEFT: IMG6063

## 2017-10-02 HISTORY — PX: IR NEPHROSTOMY EXCHANGE RIGHT: IMG6070

## 2017-10-02 LAB — CBC WITH DIFFERENTIAL/PLATELET
Basophils Absolute: 0 10*3/uL (ref 0.0–0.1)
Basophils Relative: 1 %
Eosinophils Absolute: 0.1 10*3/uL (ref 0.0–0.7)
Eosinophils Relative: 4 %
HCT: 32.2 % — ABNORMAL LOW (ref 36.0–46.0)
Hemoglobin: 9.9 g/dL — ABNORMAL LOW (ref 12.0–15.0)
Lymphocytes Relative: 23 %
Lymphs Abs: 0.7 10*3/uL (ref 0.7–4.0)
MCH: 29.1 pg (ref 26.0–34.0)
MCHC: 30.7 g/dL (ref 30.0–36.0)
MCV: 94.7 fL (ref 78.0–100.0)
Monocytes Absolute: 0.6 10*3/uL (ref 0.1–1.0)
Monocytes Relative: 19 %
Neutro Abs: 1.5 10*3/uL — ABNORMAL LOW (ref 1.7–7.7)
Neutrophils Relative %: 53 %
Platelets: 387 10*3/uL (ref 150–400)
RBC: 3.4 MIL/uL — ABNORMAL LOW (ref 3.87–5.11)
RDW: 16.9 % — ABNORMAL HIGH (ref 11.5–15.5)
WBC: 2.9 10*3/uL — ABNORMAL LOW (ref 4.0–10.5)

## 2017-10-02 LAB — PROTIME-INR
INR: 1.34
Prothrombin Time: 16.5 seconds — ABNORMAL HIGH (ref 11.4–15.2)

## 2017-10-02 LAB — URINALYSIS, ROUTINE W REFLEX MICROSCOPIC
Bilirubin Urine: NEGATIVE
Glucose, UA: NEGATIVE mg/dL
Ketones, ur: 15 mg/dL — AB
Nitrite: NEGATIVE
Protein, ur: >300 mg/dL — AB
Specific Gravity, Urine: 1.025 (ref 1.005–1.030)
pH: 5.5 (ref 5.0–8.0)

## 2017-10-02 LAB — BLOOD GAS, ARTERIAL
ACID-BASE DEFICIT: 13.8 mmol/L — AB (ref 0.0–2.0)
BICARBONATE: 11.6 mmol/L — AB (ref 20.0–28.0)
DRAWN BY: 404151
FIO2: 21
O2 SAT: 94.7 %
PCO2 ART: 25.3 mmHg — AB (ref 32.0–48.0)
Patient temperature: 98.6
pH, Arterial: 7.284 — ABNORMAL LOW (ref 7.350–7.450)
pO2, Arterial: 66 mmHg — ABNORMAL LOW (ref 83.0–108.0)

## 2017-10-02 LAB — MRSA PCR SCREENING: MRSA by PCR: POSITIVE — AB

## 2017-10-02 LAB — STREP PNEUMONIAE URINARY ANTIGEN: Strep Pneumo Urinary Antigen: NEGATIVE

## 2017-10-02 LAB — COMPREHENSIVE METABOLIC PANEL
ALT: 20 U/L (ref 14–54)
AST: 15 U/L (ref 15–41)
Albumin: 1.9 g/dL — ABNORMAL LOW (ref 3.5–5.0)
Alkaline Phosphatase: 248 U/L — ABNORMAL HIGH (ref 38–126)
Anion gap: 13 (ref 5–15)
BUN: 52 mg/dL — ABNORMAL HIGH (ref 6–20)
CO2: 14 mmol/L — ABNORMAL LOW (ref 22–32)
Calcium: 9.2 mg/dL (ref 8.9–10.3)
Chloride: 109 mmol/L (ref 101–111)
Creatinine, Ser: 3.74 mg/dL — ABNORMAL HIGH (ref 0.44–1.00)
GFR calc Af Amer: 14 mL/min — ABNORMAL LOW (ref >60)
GFR calc non Af Amer: 12 mL/min — ABNORMAL LOW (ref >60)
Glucose, Bld: 123 mg/dL — ABNORMAL HIGH (ref 65–99)
Potassium: 4 mmol/L (ref 3.5–5.1)
Sodium: 136 mmol/L (ref 135–145)
Total Bilirubin: 0.4 mg/dL (ref 0.3–1.2)
Total Protein: 7.7 g/dL (ref 6.5–8.1)

## 2017-10-02 LAB — URIC ACID: Uric Acid, Serum: 12.3 mg/dL — ABNORMAL HIGH (ref 2.3–6.6)

## 2017-10-02 LAB — URINALYSIS, MICROSCOPIC (REFLEX)

## 2017-10-02 LAB — GLUCOSE, CAPILLARY: GLUCOSE-CAPILLARY: 141 mg/dL — AB (ref 65–99)

## 2017-10-02 LAB — CK: Total CK: 16 U/L — ABNORMAL LOW (ref 38–234)

## 2017-10-02 LAB — MAGNESIUM: MAGNESIUM: 1.1 mg/dL — AB (ref 1.7–2.4)

## 2017-10-02 LAB — LACTIC ACID, PLASMA: LACTIC ACID, VENOUS: 0.9 mmol/L (ref 0.5–1.9)

## 2017-10-02 LAB — CBG MONITORING, ED: Glucose-Capillary: 100 mg/dL — ABNORMAL HIGH (ref 65–99)

## 2017-10-02 MED ORDER — HYDROMORPHONE HCL 1 MG/ML IJ SOLN
1.0000 mg | Freq: Once | INTRAMUSCULAR | Status: AC
  Administered 2017-10-02: 1 mg via INTRAVENOUS
  Filled 2017-10-02: qty 1

## 2017-10-02 MED ORDER — HYDROMORPHONE HCL 1 MG/ML IJ SOLN: 2.0000 mg | INTRAMUSCULAR | Status: DC | PRN

## 2017-10-02 MED ORDER — NALOXONE HCL 0.4 MG/ML IJ SOLN
INTRAMUSCULAR | Status: AC
  Filled 2017-10-02: qty 1

## 2017-10-02 MED ORDER — INSULIN ASPART 100 UNIT/ML ~~LOC~~ SOLN: 7.0000 [IU] | Freq: Three times a day (TID) | SUBCUTANEOUS | Status: DC

## 2017-10-02 MED ORDER — VANCOMYCIN HCL IN DEXTROSE 1-5 GM/200ML-% IV SOLN
1000.0000 mg | Freq: Once | INTRAVENOUS | Status: DC
  Filled 2017-10-02: qty 200

## 2017-10-02 MED ORDER — SALINE SPRAY 0.65 % NA SOLN
1.0000 | NASAL | Status: DC | PRN
  Filled 2017-10-02: qty 44

## 2017-10-02 MED ORDER — POLYVINYL ALCOHOL 1.4 % OP SOLN
1.0000 [drp] | Freq: Every day | OPHTHALMIC | Status: DC
  Administered 2017-10-02 – 2017-10-12 (×11): 1 [drp] via OPHTHALMIC
  Filled 2017-10-02: qty 15

## 2017-10-02 MED ORDER — VANCOMYCIN HCL 10 G IV SOLR
1500.0000 mg | Freq: Once | INTRAVENOUS | Status: AC
  Administered 2017-10-02: 1500 mg via INTRAVENOUS
  Filled 2017-10-02: qty 1500

## 2017-10-02 MED ORDER — STERILE WATER FOR INJECTION IV SOLN
INTRAVENOUS | Status: DC
  Administered 2017-10-02 – 2017-10-04 (×3): via INTRAVENOUS
  Filled 2017-10-02 (×9): qty 850

## 2017-10-02 MED ORDER — LIDOCAINE HCL (PF) 1 % IJ SOLN
INTRAMUSCULAR | Status: DC | PRN
  Administered 2017-10-02: 10 mL

## 2017-10-02 MED ORDER — SODIUM CHLORIDE 0.9% FLUSH
3.0000 mL | Freq: Two times a day (BID) | INTRAVENOUS | Status: DC
  Administered 2017-10-02 – 2017-10-11 (×11): 3 mL via INTRAVENOUS

## 2017-10-02 MED ORDER — POLYETHYL GLYCOL-PROPYL GLYCOL 0.4-0.3 % OP GEL: Freq: Every day | OPHTHALMIC | Status: DC

## 2017-10-02 MED ORDER — SERTRALINE HCL 50 MG PO TABS: 75.0000 mg | ORAL_TABLET | Freq: Every day | ORAL | Status: DC

## 2017-10-02 MED ORDER — LEVOFLOXACIN IN D5W 500 MG/100ML IV SOLN
500.0000 mg | INTRAVENOUS | Status: DC
Start: 2017-10-04 — End: 2017-10-02

## 2017-10-02 MED ORDER — CHLORHEXIDINE GLUCONATE CLOTH 2 % EX PADS
6.0000 | MEDICATED_PAD | Freq: Every day | CUTANEOUS | Status: AC
  Administered 2017-10-03 – 2017-10-07 (×5): 6 via TOPICAL

## 2017-10-02 MED ORDER — OMEGA-3-ACID ETHYL ESTERS 1 G PO CAPS: 1.0000 g | ORAL_CAPSULE | Freq: Two times a day (BID) | ORAL | Status: DC

## 2017-10-02 MED ORDER — HYDROMORPHONE HCL 1 MG/ML IJ SOLN
2.0000 mg | INTRAMUSCULAR | Status: AC
  Administered 2017-10-02: 2 mg via INTRAVENOUS
  Filled 2017-10-02: qty 2

## 2017-10-02 MED ORDER — FAMOTIDINE 20 MG PO TABS
20.0000 mg | ORAL_TABLET | Freq: Every day | ORAL | Status: DC | PRN
Start: 2017-10-02 — End: 2017-10-03

## 2017-10-02 MED ORDER — PREGABALIN 25 MG PO CAPS
50.0000 mg | ORAL_CAPSULE | Freq: Two times a day (BID) | ORAL | Status: DC
  Administered 2017-10-03 – 2017-10-13 (×20): 50 mg via ORAL
  Filled 2017-10-02 (×20): qty 2

## 2017-10-02 MED ORDER — LEVOFLOXACIN IN D5W 750 MG/150ML IV SOLN
750.0000 mg | Freq: Once | INTRAVENOUS | Status: AC
  Administered 2017-10-02: 750 mg via INTRAVENOUS
  Filled 2017-10-02: qty 150

## 2017-10-02 MED ORDER — PRENATAL MULTIVITAMIN CH
ORAL_TABLET | Freq: Every day | ORAL | Status: DC
  Filled 2017-10-02: qty 1

## 2017-10-02 MED ORDER — METHOCARBAMOL 500 MG PO TABS: 750.0000 mg | ORAL_TABLET | Freq: Three times a day (TID) | ORAL | Status: DC | PRN

## 2017-10-02 MED ORDER — LEVOTHYROXINE SODIUM 150 MCG PO TABS
150.0000 ug | ORAL_TABLET | Freq: Every day | ORAL | Status: DC
  Administered 2017-10-04 – 2017-10-13 (×10): 150 ug via ORAL
  Filled 2017-10-02: qty 1
  Filled 2017-10-02 (×5): qty 2
  Filled 2017-10-02 (×4): qty 1
  Filled 2017-10-02: qty 2
  Filled 2017-10-02: qty 1
  Filled 2017-10-02 (×2): qty 2
  Filled 2017-10-02 (×3): qty 1
  Filled 2017-10-02: qty 2
  Filled 2017-10-02: qty 1
  Filled 2017-10-02: qty 2
  Filled 2017-10-02: qty 1

## 2017-10-02 MED ORDER — TRAZODONE HCL 50 MG PO TABS: 100.0000 mg | ORAL_TABLET | Freq: Every evening | ORAL | Status: DC | PRN

## 2017-10-02 MED ORDER — PREMIER PROTEIN SHAKE
11.0000 [oz_av] | Freq: Two times a day (BID) | ORAL | Status: DC | PRN
  Filled 2017-10-02 (×3): qty 325.31

## 2017-10-02 MED ORDER — DULOXETINE HCL 30 MG PO CPEP: 30.0000 mg | ORAL_CAPSULE | Freq: Two times a day (BID) | ORAL | Status: DC

## 2017-10-02 MED ORDER — IOPAMIDOL (ISOVUE-300) INJECTION 61%
INTRAVENOUS | Status: AC
  Filled 2017-10-02: qty 50

## 2017-10-02 MED ORDER — SODIUM CHLORIDE 0.9 % IV BOLUS (SEPSIS)
1000.0000 mL | Freq: Once | INTRAVENOUS | Status: AC
  Administered 2017-10-02: 1000 mL via INTRAVENOUS

## 2017-10-02 MED ORDER — NALOXONE HCL 0.4 MG/ML IJ SOLN
0.4000 mg | INTRAMUSCULAR | Status: DC | PRN
  Administered 2017-10-02: 0.2 mg via INTRAVENOUS

## 2017-10-02 MED ORDER — VANCOMYCIN HCL IN DEXTROSE 1-5 GM/200ML-% IV SOLN: 1000.0000 mg | INTRAVENOUS | Status: DC

## 2017-10-02 MED ORDER — SODIUM CHLORIDE 0.9% FLUSH
5.0000 mL | Freq: Three times a day (TID) | INTRAVENOUS | Status: DC
  Administered 2017-10-02 – 2017-10-13 (×26): 5 mL

## 2017-10-02 MED ORDER — ENOXAPARIN SODIUM 40 MG/0.4ML ~~LOC~~ SOLN: 40.0000 mg | SUBCUTANEOUS | Status: DC

## 2017-10-02 MED ORDER — DEXTROSE 5 % IV SOLN
500.0000 mg | Freq: Three times a day (TID) | INTRAVENOUS | Status: DC
  Filled 2017-10-02: qty 0.5

## 2017-10-02 MED ORDER — COLLAGENASE 250 UNIT/GM EX OINT
1.0000 "application " | TOPICAL_OINTMENT | Freq: Every day | CUTANEOUS | Status: DC
  Filled 2017-10-02: qty 30

## 2017-10-02 MED ORDER — ARTIFICIAL TEARS OPHTHALMIC OINT
1.0000 "application " | TOPICAL_OINTMENT | Freq: Every day | OPHTHALMIC | Status: DC
  Administered 2017-10-02 – 2017-10-06 (×5): 1 via OPHTHALMIC
  Filled 2017-10-02: qty 3.5

## 2017-10-02 MED ORDER — SODIUM CHLORIDE 0.9 % IV SOLN
500.0000 mg | Freq: Two times a day (BID) | INTRAVENOUS | Status: DC
  Administered 2017-10-03 (×2): 500 mg via INTRAVENOUS
  Filled 2017-10-02 (×4): qty 0.5

## 2017-10-02 MED ORDER — FERROUS SULFATE 325 (65 FE) MG PO TABS: 325.0000 mg | ORAL_TABLET | Freq: Every day | ORAL | Status: DC

## 2017-10-02 MED ORDER — INSULIN ASPART 100 UNIT/ML ~~LOC~~ SOLN: 2.0000 [IU] | Freq: Three times a day (TID) | SUBCUTANEOUS | Status: DC

## 2017-10-02 MED ORDER — SODIUM CHLORIDE 0.9 % IV SOLN
500.0000 mg | INTRAVENOUS | Status: DC
  Administered 2017-10-03 – 2017-10-05 (×2): 500 mg via INTRAVENOUS
  Filled 2017-10-02 (×2): qty 10

## 2017-10-02 MED ORDER — ACETAMINOPHEN 325 MG PO TABS
650.0000 mg | ORAL_TABLET | Freq: Four times a day (QID) | ORAL | Status: DC | PRN
  Administered 2017-10-05 – 2017-10-12 (×11): 650 mg via ORAL
  Filled 2017-10-02 (×11): qty 2

## 2017-10-02 MED ORDER — INSULIN GLARGINE 100 UNIT/ML ~~LOC~~ SOLN
25.0000 [IU] | Freq: Two times a day (BID) | SUBCUTANEOUS | Status: DC
  Administered 2017-10-02: 25 [IU] via SUBCUTANEOUS
  Filled 2017-10-02 (×2): qty 0.25

## 2017-10-02 MED ORDER — SODIUM CHLORIDE 0.9 % IV SOLN
2.0000 g | Freq: Once | INTRAVENOUS | Status: DC
  Filled 2017-10-02: qty 2

## 2017-10-02 MED ORDER — TBO-FILGRASTIM 480 MCG/0.8ML ~~LOC~~ SOSY
480.0000 ug | PREFILLED_SYRINGE | SUBCUTANEOUS | Status: DC
Start: 2017-10-03 — End: 2017-10-04
  Filled 2017-10-02 (×3): qty 0.8

## 2017-10-02 MED ORDER — DIPHENHYDRAMINE HCL 25 MG PO CAPS
25.0000 mg | ORAL_CAPSULE | Freq: Three times a day (TID) | ORAL | Status: DC | PRN
  Administered 2017-10-11 – 2017-10-12 (×3): 25 mg via ORAL
  Filled 2017-10-02 (×4): qty 1

## 2017-10-02 MED ORDER — ROSUVASTATIN CALCIUM 10 MG PO TABS
10.0000 mg | ORAL_TABLET | Freq: Every evening | ORAL | Status: DC
Start: 2017-10-02 — End: 2017-10-02

## 2017-10-02 MED ORDER — FAMOTIDINE 20 MG PO TABS: 20.0000 mg | ORAL_TABLET | Freq: Two times a day (BID) | ORAL | Status: DC

## 2017-10-02 MED ORDER — AZTREONAM 2 G IJ SOLR
2.0000 g | Freq: Once | INTRAMUSCULAR | Status: AC
  Administered 2017-10-02: 2 g via INTRAVENOUS

## 2017-10-02 MED ORDER — MUPIROCIN 2 % EX OINT
1.0000 "application " | TOPICAL_OINTMENT | Freq: Two times a day (BID) | CUTANEOUS | Status: AC
  Administered 2017-10-02 – 2017-10-07 (×10): 1 via NASAL
  Filled 2017-10-02 (×3): qty 22

## 2017-10-02 MED ORDER — CHLORHEXIDINE GLUCONATE 4 % EX LIQD
CUTANEOUS | Status: AC
  Filled 2017-10-02: qty 15

## 2017-10-02 MED ORDER — POTASSIUM CHLORIDE IN NACL 20-0.9 MEQ/L-% IV SOLN: INTRAVENOUS | Status: DC

## 2017-10-02 MED ORDER — PSYLLIUM 95 % PO PACK
1.0000 | PACK | Freq: Two times a day (BID) | ORAL | Status: DC
  Administered 2017-10-03 – 2017-10-07 (×3): 1 via ORAL
  Filled 2017-10-02 (×12): qty 1

## 2017-10-02 MED ORDER — LIDOCAINE HCL 1 % IJ SOLN
INTRAMUSCULAR | Status: AC
  Filled 2017-10-02: qty 20

## 2017-10-02 NOTE — ED Notes (Signed)
Attempted report x 2 

## 2017-10-02 NOTE — ED Notes (Signed)
Blood collected, labeled and at bedside.

## 2017-10-02 NOTE — ED Notes (Addendum)
[name], EMT at the bedside attempting to draw lactic acid. No success thus far obtaining blood work due to patient being very difficult stick. Pt given ice chips per EDP

## 2017-10-02 NOTE — H&P (Signed)
History and Physical    [name] XTG:[id number] DOB: September 27, 1955 DOA: 10/02/2017  PCP: Ann Held, DO Patient coming from: Skilled nursing facility Consultants: Urology Dr. Tresa Moore, general surgery Dr. Johney Maine,  I have personally briefly reviewed patient's old medical records in [place]  Chief Complaint: Allergic times a few days per EMS  HPI: [name] is a 62 y.o. female with medical history significant of endometrial cancer in the past with a recurrence in 2016.  She underwent a complex hysterectomy and had a right ureteral stricture and injury requiring chronic stent placement at that time.  In August 2018 she underwent bursal of her colostomy and construction of her ureter.  At the time the bladder seem to function normal but over the ensuing months it was determined that the patient had a bladder leak.  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  In an attempt to allow the fistula to heal itself in February the patient had bilateral nephrostomy tubes placed.  A Foley catheter was also placed.  She was discharged February 23 to a skilled nursing facility for continued care.  Having a great deal of pain and palliative care saw her for pain management.  At the time she discharged on fentanyl patches as well as oral Dilaudid.  She has continued to have a great deal of pain on her left flank.  Today she remains very tender on the left flank.  She presents today with delirium and significant worsening of her renal function.  Evaluation both by physical exam and CT scan revealed that the nephrostomy tube on the left is no longer in position and that there is some left-sided mild hydronephrosis with fluid collection.  The left proximal ureter is distended in the distal ureter is not well-defined.  The previously visualized presacral fluid collection is no longer discretely visualized.  There is some soft  tissue attenuation in that space.  Additionally she has new lower lobe airspace opacities at the lungs perhaps consistent with pneumonia.  The patient is able to give very little history her speech is very difficult to understand she is uncomfortable, she is moaning and groaning.  Her husband is appropriately frustrated at her lack of improvement despite extensive amounts of medical care.  He reiterates to me that her cancer is completely gone but she does not appear to be getting better.  Most of the history is obtained from the medical record, the patient's husband and the ER physician.  The patient is really not in any condition to give any cogent history.  She will be admitted into the hospital for further evaluation and management of acute on chronic kidney disease, delirium, possible UTI, possible pneumonia and further evaluation and management of her colovesicular fistula which has recently acquired placement of nephrostomy tubes, consult with urology, consult with nephrology, consult with infectious disease, and consult with palliative care.  Patient will need a holistic team based approach to her management   Review of Systems: As per HPI otherwise all other systems reviewed and  negative.    Past Medical History:  Diagnosis Date  . Adrenal adenoma, left 02/08/2016   CT: stable benign  . Anemia in neoplastic disease   . Benign essential HTN   . 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 Cancer of corpus uteri, except isthmus [facility])  oncologist-- dr Denman [name] and dr Alvy Bimler    10-15-2004  dx endometroid endometrial and ovarian cancer s/p  chemotheapy and surgery(TAH w/ BSO) :  recurrent 11-19-2014 post pelvic surgery and radiation 01-29-2015 to 03-10-2015  . Chronic idiopathic neutropenia (HCC)    presumed  related to chemotherapy March 2006--- followed by dr Alvy Bimler (treatment w/ G-CSF injections  . Chronic nausea   . Chronic pain    perineal/ anal  area from bladder pad irritates skin , right flank pain  . CKD stage G2/A3, GFR 60-89 and albumin creatinine ratio >300 mg/g    nephrologist-  dr Madelon Lips  . Diabetic retinopathy, background ([place])   . Difficult intravenous access    small veins--- hx PICC lines  . DM type 2 (diabetes mellitus, type 2) ([place])    monitored by dr Legrand Como altheimer  . Dysuria   . Environmental and seasonal allergies   . Fatty liver 02/08/2016   CT  . Generalized muscle weakness   . GERD (gastroesophageal reflux disease)   . Hiatal hernia   . History of abdominal abscess 04/16/2017   post surgery 04-01-2017  --- resolved 10/ 2018  . History of gastric polyp    2014  duodenum  . History of ileus 04/16/2017   resolved w/ no surgical intervention  . History of radiation therapy    01-29-2015 to 03-10-2015  pelvis 50.4Gy  . Hypothyroidism    monitored by dr Legrand Como altheimer  . Ileostomy in place [facility]) 04/01/2017   created at same time colostomy takedown.  . Lower urinary tract symptoms (LUTS)    urge urinary  incontinence  . Mixed dyslipidemia   . Multiple thyroid nodules    Managed by Dr. Harlow Asa  . Pelvic abscess in female 04/16/2017  . PONV (postoperative nausea and vomiting)    "scopolamine patch works for me"  . Radiation-induced dermatitis    contact dermatitis , radiation completed, rash only on ankles now.  . Seasonal allergies   . Ureteral stricture, right UROLOGIT-  DR [facility]   CHRONIC--  TREATMENT URETERAL STENT  . 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 Vitamin D deficiency   . Wears glasses     Past Surgical History:  Procedure Laterality Date  . APPENDECTOMY    . BREAST RECONSTRUCTION WITH PLACEMENT OF TISSUE EXPANDER AND FLEX HD (ACELLULAR HYDRATED DERMIS) Bilateral 12/11/2015   Procedure: BILATERAL BREAST RECONSTRUCTION  WITH PLACEMENT OF TISSUE EXPANDERS;  Surgeon: Irene Limbo, MD;  Location: [place];  Service: Plastics;  Laterality: Bilateral;  . COLONOSCOPY WITH PROPOFOL N/A 08/21/2013   Procedure: COLONOSCOPY WITH PROPOFOL;  Surgeon: Cleotis Nipper, MD;  [facility];  Service: Endoscopy;  Laterality: N/A;  . COLOSTOMY TAKEDOWN N/A 12/04/2014   Procedure: LAPROSCOPIC LYSIS OF ADHESIONS, SPLENIC MOBILIZATION, RELOCATION OF COLOSTOMY, DEBRIDEMENT INITIAL COLOSTOMY SITE;  Surgeon: Michael [name], MD;  [facility]: [facility];  Service: General;  Laterality: N/A;  . CYSTOGRAM N/A 06/01/2017   Procedure: CYSTOGRAM;  Surgeon: Alexis Frock, MD;  [facility];  Service: Urology;  Laterality: N/A;  . CYSTOSCOPY W/ RETROGRADES Right 11/21/2015   Procedure: CYSTOSCOPY WITH RETROGRADE PYELOGRAM;  Surgeon: Alexis Frock, MD;  [facility];  Service: Urology;  Laterality: Right;  . CYSTOSCOPY W/ URETERAL STENT PLACEMENT Right 11/21/2015   Procedure: CYSTOSCOPY WITH STENT REPLACEMENT;  Surgeon: Alexis Frock, MD;  [facility];  Service: Urology;  Laterality: Right;  . CYSTOSCOPY W/ URETERAL  STENT PLACEMENT Right 03/10/2016   Procedure: CYSTOSCOPY WITH STENT REPLACEMENT;  Surgeon: Alexis Frock, MD;  Location[facility];  Service: Urology;  Laterality: Right;  . CYSTOSCOPY W/ URETERAL STENT PLACEMENT Right 06/30/2016   Procedure: CYSTOSCOPY WITH RETROGRADE PYELOGRAM/URETERAL STENT EXCHANGE;  Surgeon: Alexis Frock, MD;  Location: [facility];  Service: Urology;  Laterality: Right;  . CYSTOSCOPY W/ URETERAL STENT PLACEMENT N/A 06/01/2017   Procedure: CYSTOSCOPY WITH EXAM UNDER ANESTHESIA;  Surgeon: Alexis Frock, MD;  [facility];  Service: Urology;  Laterality: N/A;  . CYSTOSCOPY W/ URETERAL STENT PLACEMENT Right 08/17/2017   Procedure: CYSTOSCOPY WITH RETROGRADE PYELOGRAM/URETERAL STENT REMOVAL;  Surgeon: Alexis Frock, MD;  Location: [facility];   Service: Urology;  Laterality: Right;  . CYSTOSCOPY WITH RETROGRADE PYELOGRAM, URETEROSCOPY AND STENT PLACEMENT Right 03/20/2015   Procedure: CYSTOSCOPY WITH RETROGRADE PYELOGRAM, URETEROSCOPY WITH BALLOON DILATION AND STENT PLACEMENT ON RIGHT;  Surgeon: Alexis Frock, MD;  Location[facility];  Service: Urology;  Laterality: Right;  . CYSTOSCOPY WITH RETROGRADE PYELOGRAM, URETEROSCOPY AND STENT PLACEMENT Right 05/02/2015   Procedure: CYSTOSCOPY WITH RIGHT RETROGRADE PYELOGRAM,  DIAGNOSTIC URETEROSCOPY AND STENT PULL ;  Surgeon: Alexis Frock, MD;  Location: [facility];  Service: Urology;  Laterality: Right;  . CYSTOSCOPY WITH RETROGRADE PYELOGRAM, URETEROSCOPY AND STENT PLACEMENT Right 09/05/2015   Procedure: CYSTOSCOPY WITH RETROGRADE PYELOGRAM,  AND STENT PLACEMENT;  Surgeon: Alexis Frock, MD;  [facility];  Service: Urology;  Laterality: Right;  . CYSTOSCOPY WITH RETROGRADE PYELOGRAM, URETEROSCOPY AND STENT PLACEMENT Right 04/01/2017   Procedure: CYSTOSCOPY WITH RETROGRADE PYELOGRAM, URETEROSCOPY AND STENT PLACEMENT;  Surgeon: Alexis Frock, MD;  [facility];  Service: Urology;  Laterality: Right;  . CYSTOSCOPY WITH STENT PLACEMENT Right 10/27/2016   Procedure: CYSTOSCOPY WITH STENT CHANGE and right retrograde pyelogram;  Surgeon: Alexis Frock, MD;  Location: [facility];  Service: Urology;  Laterality: Right;  . EUS N/A 10/02/2014   Procedure: LOWER ENDOSCOPIC ULTRASOUND (EUS);  Surgeon: Arta Silence, MD;  Location: [facility];  Service: Endoscopy;  Laterality: N/A;  . EXCISION SOFT TISSUE MASS RIGHT FOREMAN  12-08-2006  . EYE SURGERY  as child   pytosis of eyelids repair  . INCISION AND DRAINAGE OF WOUND Bilateral 12/26/2015   Procedure: DEBRIDEMENT OF BILATERAL MASTECTOMY FLAPS;  Surgeon: Irene Limbo, MD;  Location: [place];  Service: Plastics;  Laterality: Bilateral;  . IR CV LINE INJECTION  05/31/2017  .  IR FLUORO GUIDE CV LINE LEFT  05/31/2017  . IR FLUORO GUIDE CV LINE RIGHT  04/06/2017  . IR FLUORO GUIDE CV MIDLINE PICC RIGHT  05/30/2017  . IR NEPHROSTOGRAM LEFT INITIAL PLACEMENT  09/02/2017  . IR NEPHROSTOGRAM RIGHT INITIAL PLACEMENT  09/02/2017  . IR NEPHROSTOGRAM RIGHT THRU EXISTING ACCESS  09/13/2017  . IR RADIOLOGIST EVAL & MGMT  05/03/2017  . IR US GUIDE VASC ACCESS LEFT  05/31/2017  . IR US GUIDE VASC ACCESS RIGHT  04/06/2017  . IR US GUIDE VASC ACCESS RIGHT  05/30/2017  . LAPAROSCOPIC CHOLECYSTECTOMY  1990  . LIPOSUCTION WITH LIPOFILLING Bilateral 04/16/2016   Procedure: LIPOSUCTION WITH LIPOFILLING TO BILATERAL CHEST;  Surgeon: Irene Limbo, MD;  Location: [place];  Service: Plastics;  Laterality: Bilateral;  . MASTECTOMY W/ SENTINEL NODE BIOPSY Bilateral 12/11/2015   Procedure: RIGHT PROPHYLACTIC MASTECTOMY, LEFT TOTAL MASTECTOMY WITH LEFT AXILLARY SENTINEL LYMPH NODE BIOPSY;  Surgeon: Stark Klein, MD;  Location: [place];  Service: General;  Laterality: Bilateral;  . OSTOMY N/A 11/19/2014   Procedure: OSTOMY;  Surgeon: Michael [name], MD;  [facility];  Service: General;  Laterality: N/A;  . PROCTOSCOPY N/A 04/01/2017   Procedure: RIDGE PROCTOSCOPY;  Surgeon: Michael [name], MD;  [facility];  Service: General;  Laterality: N/A;  . REMOVAL OF BILATERAL TISSUE EXPANDERS WITH PLACEMENT OF BILATERAL BREAST IMPLANTS Bilateral 04/16/2016   Procedure: REMOVAL OF BILATERAL TISSUE EXPANDERS WITH PLACEMENT OF BILATERAL BREAST IMPLANTS;  Surgeon: Irene Limbo, MD;  Location: [place];  Service: Plastics;  Laterality: Bilateral;  . ROBOTIC ASSISTED LAP VAGINAL HYSTERECTOMY N/A 11/19/2014   Procedure: ROBOTIC LYSIS OF ADHESIONS, CONVERTED TO LAPAROTOMY RADICAL UPPER VAGINECTOMY,LOW ANTERIOR BOWEL RESECTION, COLOSTOMY, BILATERAL URETERAL STENT PLACEMENT AND CYSTONOMY CLOSURE;  Surgeon: Everitt Amber, MD;  [facility];  Service: Gynecology;  Laterality: N/A;    . TISSUE EXPANDER FILLING Bilateral 12/26/2015   Procedure: EXPANSION OF BILATERAL CHEST TISSUE EXPANDERS (60 mL- Right; 75 mL- Left);  Surgeon: Irene Limbo, MD;  Location: [place];  Service: Plastics;  Laterality: Bilateral;  . TONSILLECTOMY    . TOTAL ABDOMINAL HYSTERECTOMY  March 2006   [facility]   and Bilateral Salpingoophorectomy/  staging for Ovarian cancer/  an  . XI ROBOTIC ASSISTED LOWER ANTERIOR RESECTION N/A 04/01/2017   Procedure: XI ROBOTIC VS LAPAROSCOPIC COLOSTOMY TAKEDOWN WITH LYSIS OF ADHESIONS.;  Surgeon: Michael [name], MD;  [facility];  Service: General;  Laterality: N/A;  ERAS PATHWAY     reports that  has never smoked. she has never used smokeless tobacco. She reports that she drinks alcohol. She reports that she does not use drugs.  Allergies  Allergen Reactions  . Penicillins Swelling    Facial swelling/childhood allergy Has patient had a PCN reaction causing immediate rash, facial/tongue/throat swelling, SOB or lightheadedness with hypotension: Yes Has patient had a PCN reaction causing severe rash involving mucus membranes or skin necrosis: Yes Has patient had a PCN reaction that required hospitalization yes Has patient had a PCN reaction occurring within the last 10 years: No If all of the above answers are "NO", then may proceed with Cephalosporin use.   . Adhesive [Tape]     blisters  . Cefaclor Rash    Ceclor  . Erythromycin Other (See Comments)    Gastritis, abd cramps  . Trimethoprim Rash  . Ultram [Tramadol] Hives  . Fluconazole Rash  . Oxycodone Other (See Comments)    " I just feel weird"  . Pectin Rash    Pectin ring for stoma  . Sulfa Antibiotics Rash    Family History  Problem Relation Age of Onset  . Cancer Mother 27       stomach ca  . Hypertension Mother   . Cancer Father 39       prostate ca  . Diabetes Father   . Heart disease Father        CABG  . Breast cancer Maternal Aunt        dx in her 89s  .  Lymphoma Paternal Aunt   . Brain cancer Paternal Grandfather   . Ovarian cancer Other   . Diabetes Sister   . Hypertension Brother y-10  . Heart disease Brother        CABG  . Diabetes Brother      Prior to Admission medications   Medication Sig Start Date End Date Taking? Authorizing Provider  acetaminophen (TYLENOL) 325 MG tablet Take 2 tablets (650 mg total) by  mouth every 6 (six) hours as needed. 09/24/17   Roxan Hockey, MD  ALPRAZolam Duanne Moron) 0.5 MG tablet Take 1 tablet (0.5 mg total) by mouth 3 (three) times daily as needed for anxiety. 09/24/17   Roxan Hockey, MD  anastrozole (ARIMIDEX) 1 MG tablet Take 1 tablet (1 mg total) by mouth daily. 09/24/17   Roxan Hockey, MD  artificial tears (LACRILUBE) OINT ophthalmic ointment Place into both eyes at bedtime. 09/24/17   Roxan Hockey, MD  Biotin 5 MG TABS Take 5 mg by mouth every morning.     [provider]  Cholecalciferol (VITAMIN D3) 10000 UNITS capsule Take 10,000 Units by mouth once a week. Sunday evening's    [provider]  ciprofloxacin (CIPRO) 500 MG tablet Take 1 tablet (500 mg total) by mouth 2 (two) times daily. 09/24/17   Roxan Hockey, MD  diphenhydrAMINE (BENADRYL) 25 mg capsule Take 1 capsule (25 mg total) by mouth every 8 (eight) hours as needed for itching, allergies or sleep. 09/24/17   Roxan Hockey, MD  diphenoxylate-atropine (LOMOTIL) 2.5-0.025 MG tablet Take 1 tablet by mouth 4 (four) times daily as needed for diarrhea or loose stools. TO PREVENT LOOSE BOWEL MOVEMENTS 09/24/17   Roxan Hockey, MD  DULoxetine (CYMBALTA) 30 MG capsule Take 1 capsule (30 mg total) by mouth 2 (two) times daily. 09/24/17   Roxan Hockey, MD  famotidine-calcium carbonate-magnesium hydroxide (PEPCID COMPLETE) 10-800-165 MG chewable tablet Chew 1 tablet by mouth daily as needed (INDIGESTION). 09/24/17   Roxan Hockey, MD  fentaNYL (DURAGESIC - DOSED MCG/HR) 75 MCG/HR Place 1 patch (75 mcg total) onto  the skin every 3 (three) days. 09/25/17   Roxan Hockey, MD  ferrous sulfate 325 (65 FE) MG tablet Take 1 tablet (325 mg total) by mouth at bedtime. 09/24/17   Roxan Hockey, MD  filgrastim (NEUPOGEN) 480 MCG/1.6ML injection Inject 480 mcg/1.6 ml under the skin every 6 days for life (last dose 09/22/17) 09/24/17   Emokpae, Courage, MD  Hydrocortisone (GERHARDT'S BUTT CREAM) CREA Apply 1 application topically 4 (four) times daily. 09/24/17   Roxan Hockey, MD  HYDROmorphone (DILAUDID) 4 MG tablet Take 1.5 tablets (6 mg total) by mouth every 3 (three) hours as needed for moderate pain or severe pain. 09/24/17   Roxan Hockey, MD  insulin aspart (NOVOLOG) 100 UNIT/ML injection Inject 0-20 Units into the skin 3 (three) times daily with meals. 09/24/17   Roxan Hockey, MD  insulin aspart (NOVOLOG) 100 UNIT/ML injection Inject 7 Units into the skin 3 (three) times daily with meals. 09/24/17   Roxan Hockey, MD  insulin glargine (LANTUS) 100 UNIT/ML injection Inject 0.25 mLs (25 Units total) into the skin 2 (two) times daily. 09/24/17   Roxan Hockey, MD  levothyroxine (SYNTHROID, LEVOTHROID) 150 MCG tablet Take 150 mcg by mouth daily before breakfast.     [provider]  levothyroxine (SYNTHROID, LEVOTHROID) 150 MCG tablet Take 1 tablet (150 mcg total) by mouth daily before breakfast. 09/25/17   Emokpae, Courage, MD  lidocaine (LIDODERM) 5 % Place 1 patch onto the skin daily. Remove & Discard patch within 12 hours 09/25/17   Roxan Hockey, MD  lip balm (CARMEX) ointment Apply 1 application topically 2 (two) times daily. 09/24/17   Roxan Hockey, MD  loperamide (IMODIUM) 2 MG capsule Take 1-2 capsules (2-4 mg total) by mouth every 8 (eight) hours as needed for diarrhea or loose stools (Use if >2 BM every 8 hours). 09/24/17   Roxan Hockey, MD  loratadine (CLARITIN) 10 MG  tablet Take 10 mg by mouth every morning.     [provider]  methocarbamol (ROBAXIN) 750 MG tablet  Take 1 tablet (750 mg total) by mouth 3 (three) times daily. 09/24/17   Roxan Hockey, MD  omega-3 acid ethyl esters (LOVAZA) 1 G capsule Take 1 g by mouth 2 (two) times daily.     [provider]  ondansetron (ZOFRAN-ODT) 4 MG disintegrating tablet Take 4 mg by mouth every 6 (six) hours as needed for nausea or vomiting.  04/12/17   [provider]  Polyethyl Glycol-Propyl Glycol (SYSTANE OP) Place 1 drop into both eyes at bedtime.     [provider]  polyvinyl alcohol (LIQUIFILM TEARS) 1.4 % ophthalmic solution Place 1 drop into both eyes at bedtime. 09/24/17   Roxan Hockey, MD  pregabalin (LYRICA) 75 MG capsule Take 1 capsule (75 mg total) by mouth 2 (two) times daily. 09/24/17   Roxan Hockey, MD  Prenatal Vit-Fe Fumarate-FA (PRENATAL VITAMIN PO) Take 1 capsule by mouth daily. Takes prenatal because there are no dyes in it    [provider]  prochlorperazine (COMPAZINE) 10 MG tablet Take 1 tablet (10 mg total) by mouth 2 (two) times daily as needed for nausea or vomiting. Patient taking differently: Take 10 mg by mouth 2 (two) times daily as needed for nausea or vomiting.  05/28/17   Virgel Manifold, MD  promethazine (PHENERGAN) 25 MG tablet Take 1 tablet (25 mg total) by mouth every 6 (six) hours as needed for nausea or vomiting. 05/28/17   Virgel Manifold, MD  protein supplement shake (PREMIER PROTEIN) LIQD Take 325 mLs (11 oz total) by mouth 2 (two) times daily as needed (Pt to request). 09/24/17   Roxan Hockey, MD  psyllium (HYDROCIL/METAMUCIL) 95 % PACK Take 1 packet by mouth 2 (two) times daily. 09/24/17   Roxan Hockey, MD  ranitidine (ZANTAC) 150 MG tablet Take 1 tablet (150 mg total) by mouth 2 (two) times daily as needed for heartburn. 09/24/17   Roxan Hockey, MD  rosuvastatin (CRESTOR) 10 MG tablet Take 10 mg by mouth every evening.     [provider]  sertraline (ZOLOFT) 50 MG tablet Take 1.5 tablets (75 mg total) by mouth  daily. 09/24/17   Roxan Hockey, MD  sitaGLIPtin (JANUVIA) 100 MG tablet Take 100 mg by mouth every morning.     [provider]  traZODone (DESYREL) 100 MG tablet Take 1 tablet (100 mg total) by mouth at bedtime as needed for sleep. 09/24/17   Roxan Hockey, MD    Physical Exam: Vitals:   10/02/17 1542 10/02/17 1545 10/02/17 1546 10/02/17 1547  BP:  (!) 164/77    Pulse: (!) 128 (!) 126 (!) 128 (!) 127  Resp: _0 Temp:      TempSrc:      SpO2: 94% 92% 96% 96%    Constitutional: Uncomfortable and in pain with audible Kussmaul respirations Vitals:   10/02/17 1542 10/02/17 1545 10/02/17 1546 10/02/17 1547  BP:  (!) 164/77    Pulse: (!) 128 (!) 126 (!) 128 (!) 127  Resp: _1 Temp:      TempSrc:      SpO2: 94% 92% 96% 96%   Eyes: PERRL, lids and conjunctivae normal ENMT: Mucous membranes are dry. Posterior pharynx clear of any exudate or lesions.Normal dentition.  Neck: normal, supple, no masses, no thyromegaly Respiratory: clear to auscultation bilaterally, no wheezing, no crackles.  Increased respiratory effort.  No accessory muscle use.  Cardiovascular: Tachycardic rate and rhythm, no murmurs / rubs / gallops. No extremity edema. 2+ pedal pulses. No carotid bruits.  Abdomen: Significant tenderness on the left side and diffusely also over to the right lower abdomen most tender on the left flank radiating down to the bladder area., no masses palpated. No hepatosplenomegaly. Bowel sounds positive hypoactive.  Musculoskeletal: no clubbing / cyanosis. No joint deformity upper and lower extremities. Good ROM, no contractures. Normal muscle tone.  Skin: no rashes, lesions, ulcers. No induration Neurologic: . Sensation intact, DTR normal. Psychiatric: Lethargic minimally verbal flat affect   Labs on Admission: I have personally reviewed following labs and imaging studies  CBC: Recent Labs  Lab 10/02/17 0729  WBC 2.9*  NEUTROABS 1.5*  HGB 9.9*  HCT  32.2*  MCV 94.7  PLT 188   Basic Metabolic Panel: Recent Labs  Lab 10/02/17 0729  NA 136  K 4.0  CL 109  CO2 14*  GLUCOSE 123*  BUN 52*  CREATININE 3.74*  CALCIUM 9.2   GFR: Estimated Creatinine Clearance: 16 mL/min (A) (by C-G formula based on SCr of 3.74 mg/dL (H)). Liver Function Tests: Recent Labs  Lab 10/02/17 0729  AST 15  ALT 20  ALKPHOS 248*  BILITOT 0.4  PROT 7.7  ALBUMIN 1.9*   No results for input(s): LIPASE, AMYLASE in the last 168 hours. No results for input(s): AMMONIA in the last 168 hours. Coagulation Profile: No results for input(s): INR, PROTIME in the last 168 hours. Cardiac Enzymes: No results for input(s): CKTOTAL, CKMB, CKMBINDEX, TROPONINI in the last 168 hours. BNP (last 3 results) No results for input(s): PROBNP in the last 8760 hours. HbA1C: No results for input(s): HGBA1C in the last 72 hours. CBG: Recent Labs  Lab 10/02/17 0653  GLUCAP 100*   Lipid Profile: No results for input(s): CHOL, HDL, LDLCALC, TRIG, CHOLHDL, LDLDIRECT in the last 72 hours. Thyroid Function Tests: No results for input(s): TSH, T4TOTAL, FREET4, T3FREE, THYROIDAB in the last 72 hours. Anemia Panel: No results for input(s): VITAMINB12, FOLATE, FERRITIN, TIBC, IRON, RETICCTPCT in the last 72 hours. Urine analysis:    Component Value Date/Time   COLORURINE YELLOW 10/02/2017 0836   APPEARANCEUR CLOUDY (A) 10/02/2017 0836   LABSPEC 1.025 10/02/2017 0836   LABSPEC 1.015 11/17/2015 1436   PHURINE 5.5 10/02/2017 0836   GLUCOSEU NEGATIVE 10/02/2017 0836   GLUCOSEU 2,000 11/17/2015 1436   HGBUR MODERATE (A) 10/02/2017 0836   BILIRUBINUR NEGATIVE 10/02/2017 0836   BILIRUBINUR neg 08/30/2016 1025   BILIRUBINUR Negative 11/17/2015 1436   KETONESUR 15 (A) 10/02/2017 0836   PROTEINUR >300 (A) 10/02/2017 0836   UROBILINOGEN negative 08/30/2016 1025   UROBILINOGEN 0.2 11/17/2015 1436   NITRITE NEGATIVE 10/02/2017 0836   LEUKOCYTESUR LARGE (A) 10/02/2017 0836    LEUKOCYTESUR Moderate 11/17/2015 1436    Radiological Exams on Admission: Ct Abdomen Pelvis Wo Contrast  Result Date: 10/02/2017 CLINICAL DATA:  Pt has sepsis Had recent enterococcus wound infection and also serratia UTI EXAM: CT ABDOMEN AND PELVIS WITHOUT CONTRAST TECHNIQUE: Multidetector CT imaging of the abdomen and pelvis was performed following the standard protocol without IV contrast. COMPARISON:  09/22/2017 FINDINGS: Lower chest: There is patchy opacity in the lower lobes at the lung bases, right greater than left. This is new since the prior CT. This may all be atelectasis. Pneumonia should be considered if there are consistent clinical findings. No pulmonary edema. No pleural effusion. Hepatobiliary: Normal liver. Gallbladder surgically absent. Mild prominence  of common bile duct with normal distal tapering. Pancreas: No pancreatic mass or inflammation. Mild pancreatic atrophy. Spleen: Normal in size without focal abnormality. Adrenals/Urinary Tract: 17 mm left adrenal mass, stable from the prior CT. Left kidney is swollen with a poorly defined collecting system that appears at least mildly dilated. There is a nonobstructing stone in the lower pole. Right ureter is mildly dilated. It is not well visualized distally. No convincing ureteral stone. A nephrostomy catheter lies within the right kidney. No significant right hydronephrosis. Right ureter is not well followed distally. No convincing ureteral stone. No renal masses. Bladder is decompressed with a Foley catheter. Nephrostomy tube on the left has been removed since the prior CT. Stomach/Bowel: No bowel obstruction or convincing inflammation. Right mid abdomen colostomy. Stomach is unremarkable. Vascular/Lymphatic: There are prominent retroperitoneal lymph nodes, none pathologically enlarged, all stable from the prior CT. No vascular abnormality. Reproductive: Uterus is surgically absent.  No discrete pelvic mass. Other: The presacral collection  noted on the prior CT dated 09/15/2017, is no longer discretely seen. There is still soft tissue attenuation in this location adjacent to surgical vascular clips. The left posterior pelvic pigtail catheter has been removed since the prior exam. No significant ascites. Musculoskeletal: No fracture or acute finding. No osteoblastic or osteolytic lesions. IMPRESSION: 1. Since the prior CT, the left posterior pelvic pigtail catheter has been removed. This catheter had withdrawn into the gluteus maximus from the presacral collection seen on the CT dated 09/15/2017. 2. The presacral fluid collection, where the pigtail catheter had originally been placed, is no longer discretely visualized, although there is still soft tissue attenuation in the presacral space. The residual soft tissue attenuation may be postsurgical scarring/change. No definite residual or recurrent abscess. 3. There are new lower lobe airspace opacities at the lung bases, which may all be atelectasis. Pneumonia should be suspected if there are consistent clinical findings. 4. Left nephrostomy tube has been removed since the prior CT. Left kidney appears swollen with at least mild hydronephrosis. The proximal left ureter is mildly distended. The distal ureter is not well-defined. 5. No change in the appearance of the right kidney with stable positioning of the right nephrostomy tube. 6. Changes from bowel surgery and formation of the right mid to upper quadrant colostomy are stable. Electronically Signed   By: Lajean Manes M.D.   On: 10/02/2017 10:13   Ct Head Wo Contrast  Result Date: 10/02/2017 CLINICAL DATA:  Altered mental status.  Nonverbal. EXAM: CT HEAD WITHOUT CONTRAST TECHNIQUE: Contiguous axial images were obtained from the base of the skull through the vertex without intravenous contrast. COMPARISON:  None. FINDINGS: Brain: No evidence of acute infarction, hemorrhage, hydrocephalus, extra-axial collection or mass lesion/mass effect.  Vascular: No hyperdense vessel or unexpected calcification. Skull: Normal. Negative for fracture or focal lesion. Sinuses/Orbits: Globes and orbits are unremarkable. There is a small amount of dependent fluid in the right sphenoid sinus. Sinuses otherwise clear. Clear mastoid air cells. Other: None. IMPRESSION: No intracranial abnormality. Electronically Signed   By: Lajean Manes M.D.   On: 10/02/2017 12:04   Dg Chest Portable 1 View  Result Date: 10/02/2017 CLINICAL DATA:  Lethargy, dyspnea EXAM: PORTABLE CHEST 1 VIEW COMPARISON:  08/31/2017 FINDINGS: Lungs are clear.  No pleural effusion or pneumothorax. The heart is normal in size. IMPRESSION: No evidence of acute cardiopulmonary disease. Electronically Signed   By: Julian Hy M.D.   On: 10/02/2017 08:12    EKG: Independently reviewed.  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  She presents with signs and symptoms consistent with infection data pointing to left collecting system and or pneumonia  and she is being admitted into the hospital for further evaluation and management of problems as noted in the HPI.  1.  Acute renal failure superimposed on stage III chronic kidney disease: Suspect this is due to new onset renal failure likely due to poor clearance of urine due to nephrostomy tube being out of position.  I very much appreciate Dr. Deterding's consultation.  I have personally spoken to Dr. Francena Hanly who will be placing her nephrostomy tube this afternoon.  We will continue to monitor renal function closely and avoid nephrotoxic agents and hydrate judiciously.  2.  Abdominal pain: Likely related to discomfort from colovesicular fistula as well as dislodged nephrostomy tube with subsequent collection of urine.  Foley replacement of nephrostomy tube will add some pain relief.  3.  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  Will consult urology and continue present management depending on urological reevaluation.  4.  Metabolic acidosis: Likely related to acute renal failure.  Hopefully placement of nephrostomy tube and hydration will resolve this problem will monitor closely recheck labs in a.m.  5.  Chronic neutropenia: Patient takes Neupogen for life every 6 days.  6.  Ureteral stricture on the right status post resection/reimplantation into the bladder (Heineke -Mikulicz with psoas hitch) 04/01/2017 consultation to urology.  7.  Protein calorie malnutrition moderate: We will consult nutritionist as well as continue supplemental products.  8.  Wound of right buttock: We will consult wound care.  9.  Decubitus ulcer of sacral region stage II: Wound care has been consulted we will asked him to reevaluate this.  10.  Primary hypothyroidism noted continue home levothyroxine dose.  11.  Diabetes type 2: Continue home Lantus insulin dosing and sliding scale coverage  suspect patient's glucoses will be fairly labile until renal function returns to normal.  12.  Benign essential hypertension: Continue home medications.  13.  History of ovarian and endometrial cancer: Patient is currently cancer free all of her problems arise from complications due to the very complicated surgery she had in 2016 and in August 2018   DVT prophylaxis: Lovenox Code Status: Full code Family Communication: Patient's husband who is present at the bedside Disposition Plan: Likely back to skilled facility in 3 or 4 days Consults called: Dr. Jess Barters from urology, Dr. Jimmy Footman from nephrology, Dr. Pascal Lux from interventional radiology,  Dr. Johnnye Sima from infectious disease Admission status: Inpatient   Lady Deutscher MD [facility] Pager [phone]  If 7PM-7AM, please contact night-coverage [email or web address] Password [email or web address]  10/02/2017, 4:01 PM

## 2017-10-02 NOTE — ED Notes (Signed)
Attempted report x1. 

## 2017-10-02 NOTE — Consult Note (Addendum)
[facility] for Infectious Disease    Date of Admission:  10/02/2017   Total days of antibiotics: 0                Reason for Consult: Urinary tract infection    Referring Provider: Evangeline Gula   Assessment: Sepsis Urinary tract infection Ileostomy, nephrostomy, foley catheter (indwelling) Decubitus Ulcers ARF Endometrial, Ovarian CAs DM2 L breast CA   Plan: 1. Change her to dapto and merrem 2. Await her BCx 3. Consider CCM eval please. she is going to step down.  4. Needs WOC eval for her decubitus as well as decubitus care (airflow/offloading mattress, nutrition eval).  5. I will defer to primary care as to having palliative care see her. They have seen her before for pain mgmt.  6. She has been on cipro- wouild not expect levaquin to be useful with her worsening.  7. Data on "double coverage" for gram negative infections is not great.  8. I do appreciate HIV testing, I am not sure it applies in this scenario.    Comment- Pt is severely ill and I am not sure of her overall prognosis.  Dapoto will decrease her risk of kidney insult (as opposed to vanco). She has received cephalosporins before (rash) several years ago. Childhood allergies are often unreliable. Carbapenems have a low risk of cross reactivity.  She has been tolerating Cipro, I am removing it from her allergy l\ist.    Thank you so much for the consult,  Active Problems:   Acute renal failure superimposed on stage 3 chronic kidney disease ([place])   . enoxaparin (LOVENOX) injection  40 mg Subcutaneous Q24H  . sodium chloride flush  3 mL Intravenous Q12H    HPI: [name] is a 62 y.o. female with hx with hx of ovarian and endometrial cancer and colecetomy 2016, with takedown 2018 with ileocecal resection/anal anastomosis with diverting loop ileostomy and ureteral stents. She also had perc nephrostomy tubes placed (09-02-17).   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  Of note her surgical f/u 2-11 notes delayed leak of her radiated bladder and inability to correct this. She was noted to have bacteruria (GBS, lactobacillus, serratia, yeast) and was d/c with cipro.  She was also noted to have presacral abscess on CT and had CT guided drain placed on 09-16-17. Her cipro was to treat this as well. Her drain was removed prior to d/c after it moved out of position.   Now adm from SNF with progressive mental status worsening, lethargy. Her spouse states she has had temps at SNF, up to 101. He states in the first 3 days post d/c shed gradually improved before plateau-ing then gradually declining over last 3 days.  Upon arrival to the ED, her L nephrostomy came out and purulence was noted in the area as well as a poor wound. A repeat CT pelvis shows fluid collection resolved, L hydronephrosis (tube removed).  Her CT head and CXR were (-).   She has a childhood allergy top PEN per her spouse.   The past medical history, family history and social history were reviewed/updated in EPIC   Review of Systems: Review of Systems  Unable to perform ROS: Critical illness  she does not answer questions or vocalize. She does not interact.   Past Medical History:  Diagnosis Date  . Adrenal adenoma, left 02/08/2016   CT: stable  benign  . Anemia in neoplastic disease   . Benign essential HTN   . 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 Cancer of corpus uteri, except isthmus [facility])  oncologist-- dr Denman [name] and dr Alvy Bimler    10-15-2004  dx endometroid endometrial and ovarian cancer s/p  chemotheapy and surgery(TAH w/ BSO) :  recurrent 11-19-2014 post pelvic surgery and radiation 01-29-2015 to 03-10-2015  . Chronic idiopathic neutropenia (HCC)    presumed related to  chemotherapy March 2006--- followed by dr Alvy Bimler (treatment w/ G-CSF injections  . Chronic nausea   . Chronic pain    perineal/ anal  area from bladder pad irritates skin , right flank pain  . CKD stage G2/A3, GFR 60-89 and albumin creatinine ratio >300 mg/g    nephrologist-  dr Madelon Lips  . Diabetic retinopathy, background ([place])   . Difficult intravenous access    small veins--- hx PICC lines  . DM type 2 (diabetes mellitus, type 2) ([place])    monitored by dr Legrand Como altheimer  . Dysuria   . Environmental and seasonal allergies   . Fatty liver 02/08/2016   CT  . Generalized muscle weakness   . GERD (gastroesophageal reflux disease)   . Hiatal hernia   . History of abdominal abscess 04/16/2017   post surgery 04-01-2017  --- resolved 10/ 2018  . History of gastric polyp    2014  duodenum  . History of ileus 04/16/2017   resolved w/ no surgical intervention  . History of radiation therapy    01-29-2015 to 03-10-2015  pelvis 50.4Gy  . Hypothyroidism    monitored by dr Legrand Como altheimer  . Ileostomy in place [facility]) 04/01/2017   created at same time colostomy takedown.  . Lower urinary tract symptoms (LUTS)    urge urinary  incontinence  . Mixed dyslipidemia   . Multiple thyroid nodules    Managed by Dr. Harlow Asa  . Pelvic abscess in female 04/16/2017  . PONV (postoperative nausea and vomiting)    "scopolamine patch works for me"  . Radiation-induced dermatitis    contact dermatitis , radiation completed, rash only on ankles now.  . Seasonal allergies   . Ureteral stricture, right UROLOGIT-  DR [facility]   CHRONIC--  TREATMENT URETERAL STENT  . 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 Vitamin D deficiency   . Wears glasses     Social History   Tobacco Use  . Smoking status: Never Smoker  . Smokeless tobacco: Never Used  Substance Use Topics  . Alcohol use: Yes    Comment: rare social  . Drug use: No    Family History  Problem Relation Age of Onset  . Cancer  Mother 74       stomach ca  . Hypertension Mother   . Cancer Father 54       prostate ca  . Diabetes Father   . Heart disease Father        CABG  . Breast cancer Maternal Aunt        dx in her 9s  . Lymphoma Paternal Aunt   . Brain cancer Paternal Grandfather   . Ovarian cancer Other   . Diabetes Sister   . Hypertension Brother y-10  . Heart disease Brother        CABG  . Diabetes Brother  Medications:  Scheduled: . enoxaparin (LOVENOX) injection  40 mg Subcutaneous Q24H  . sodium chloride flush  3 mL Intravenous Q12H    Abtx:  Anti-infectives (From admission, onward)   Start     Dose/Rate Route Frequency Ordered Stop   10/04/17 1200  vancomycin (VANCOCIN) IVPB 1000 mg/200 mL premix     1,000 mg 200 mL/hr over 60 Minutes Intravenous Every 48 hours 10/02/17 0903     10/04/17 0800  levofloxacin (LEVAQUIN) IVPB 500 mg     500 mg 100 mL/hr over 60 Minutes Intravenous Every 48 hours 10/02/17 0903     10/02/17 2000  aztreonam (AZACTAM) 500 mg in dextrose 5 % 50 mL IVPB     500 mg 100 mL/hr over 30 Minutes Intravenous Every 8 hours 10/02/17 0903     10/02/17 0915  aztreonam (AZACTAM) 2 g in sodium chloride 0.9 % 100 mL IVPB     2 g 200 mL/hr over 30 Minutes Intravenous  Once 10/02/17 0902 10/02/17 1007   10/02/17 0915  vancomycin (VANCOCIN) 1,500 mg in sodium chloride 0.9 % 500 mL IVPB     1,500 mg 250 mL/hr over 120 Minutes Intravenous  Once 10/02/17 0902 10/02/17 1243   10/02/17 0730  levofloxacin (LEVAQUIN) IVPB 750 mg     750 mg 100 mL/hr over 90 Minutes Intravenous  Once 10/02/17 0723 10/02/17 0915   10/02/17 0730  aztreonam (AZACTAM) 2 g in sodium chloride 0.9 % 100 mL IVPB  Status:  Discontinued     2 g 200 mL/hr over 30 Minutes Intravenous  Once 10/02/17 0723 10/02/17 0902   10/02/17 0730  vancomycin (VANCOCIN) IVPB 1000 mg/200 mL premix  Status:  Discontinued     1,000 mg 200 mL/hr over 60 Minutes Intravenous  Once 10/02/17 0723 10/02/17 0902         OBJECTIVE: Blood pressure 127/70, pulse (!) 111, temperature 99.3 F (37.4 C), temperature source Rectal, resp. rate (!) 21, SpO2 97 %.  Physical Exam  Constitutional: She appears toxic. She has a sickly appearance. She appears distressed.  HENT:  Mouth/Throat: No oropharyngeal exudate.  Eyes: EOM are normal. Pupils are equal, round, and reactive to light.  Neck: Neck supple.  Cardiovascular: Tachycardia present.  Pulmonary/Chest: Tachypnea noted. She has rhonchi.  Abdominal: Soft. Bowel sounds are normal. She exhibits no distension.    Musculoskeletal:       Arms: Lymphadenopathy:    She has no cervical adenopathy.    Lab Results Results for orders placed or performed during the hospital encounter of 10/02/17 (from the past 48 hour(s))  CBG monitoring, ED     Status: Abnormal   Collection Time: 10/02/17  6:53 AM  Result Value Ref Range   Glucose-Capillary 100 (H) 65 - 99 mg/dL  Comprehensive metabolic panel     Status: Abnormal   Collection Time: 10/02/17  7:29 AM  Result Value Ref Range   Sodium 136 135 - 145 mmol/L   Potassium 4.0 3.5 - 5.1 mmol/L   Chloride 109 101 - 111 mmol/L   CO2 14 (L) 22 - 32 mmol/L   Glucose, Bld 123 (H) 65 - 99 mg/dL   BUN 52 (H) 6 - 20 mg/dL   Creatinine, Ser 3.74 (H) 0.44 - 1.00 mg/dL   Calcium 9.2 8.9 - 10.3 mg/dL   Total Protein 7.7 6.5 - 8.1 g/dL   Albumin 1.9 (L) 3.5 - 5.0 g/dL   AST 15 15 - 41 U/L   ALT 20 14 -  54 U/L   Alkaline Phosphatase 248 (H) 38 - 126 U/L   Total Bilirubin 0.4 0.3 - 1.2 mg/dL   GFR calc non Af Amer 12 (L) >60 mL/min   GFR calc Af Amer 14 (L) >60 mL/min    Comment: (NOTE) The eGFR has been calculated using the CKD EPI equation. This calculation has not been validated in all clinical situations. eGFR's persistently <60 mL/min signify possible Chronic Kidney Disease.    Anion gap 13 5 - 15    Comment: Performed at [facility] [address] [place], [place] [address]  CBC with Differential      Status: Abnormal   Collection Time: 10/02/17  7:29 AM  Result Value Ref Range   WBC 2.9 (L) 4.0 - 10.5 K/uL   RBC 3.40 (L) 3.87 - 5.11 MIL/uL   Hemoglobin 9.9 (L) 12.0 - 15.0 g/dL   HCT 32.2 (L) 36.0 - 46.0 %   MCV 94.7 78.0 - 100.0 fL   MCH 29.1 26.0 - 34.0 pg   MCHC 30.7 30.0 - 36.0 g/dL   RDW 16.9 (H) 11.5 - 15.5 %   Platelets 387 150 - 400 K/uL   Neutrophils Relative % 53 %   Neutro Abs 1.5 (L) 1.7 - 7.7 K/uL   Lymphocytes Relative 23 %   Lymphs Abs 0.7 0.7 - 4.0 K/uL   Monocytes Relative 19 %   Monocytes Absolute 0.6 0.1 - 1.0 K/uL   Eosinophils Relative 4 %   Eosinophils Absolute 0.1 0.0 - 0.7 K/uL   Basophils Relative 1 %   Basophils Absolute 0.0 0.0 - 0.1 K/uL    Comment: Performed at [facility], [address] [place], [place] [address]  Urinalysis, Routine w reflex microscopic     Status: Abnormal   Collection Time: 10/02/17  8:36 AM  Result Value Ref Range   Color, Urine YELLOW YELLOW   APPearance CLOUDY (A) CLEAR   Specific Gravity, Urine 1.025 1.005 - 1.030   pH 5.5 5.0 - 8.0   Glucose, UA NEGATIVE NEGATIVE mg/dL   Hgb urine dipstick MODERATE (A) NEGATIVE   Bilirubin Urine NEGATIVE NEGATIVE   Ketones, ur 15 (A) NEGATIVE mg/dL   Protein, ur >300 (A) NEGATIVE mg/dL   Nitrite NEGATIVE NEGATIVE   Leukocytes, UA LARGE (A) NEGATIVE    Comment: Performed at [facility] [address] [place], [place] [address]  Urinalysis, Microscopic (reflex)     Status: Abnormal   Collection Time: 10/02/17  8:36 AM  Result Value Ref Range   RBC / HPF 6-30 0 - 5 RBC/hpf   WBC, UA TOO NUMEROUS TO COUNT 0 - 5 WBC/hpf   Bacteria, UA MANY (A) NONE SEEN   Squamous Epithelial / LPF 0-5 (A) NONE SEEN    Comment: Performed at [facility], [place] [address], [place], [place] [address]  Lactic acid, plasma     Status: None   Collection Time: 10/02/17 12:31 PM  Result Value Ref Range   Lactic Acid, Venous 0.9 0.5 - 1.9 mmol/L    Comment: Performed at [facility] [address] [place], [place] [address]      Component Value Date/Time   SDES  09/16/2017 1214    ABSCESS SITE NOT SPECIFIED Performed at [facility] [address] [place],  [address]    SPECREQUEST  09/16/2017 1214    NONE Performed at [facility], [place]  [address] [place], [place] [address]    CULT  09/16/2017 1214    MODERATE GROUP B STREP(S.AGALACTIAE)ISOLATED TESTING AGAINST S. AGALACTIAE NOT ROUTINELY PERFORMED DUE TO PREDICTABILITY OF AMP/PEN/VAN SUSCEPTIBILITY. WITHIN MIXED CULTURE NO ANAEROBES ISOLATED Performed at [facility], [place] [address], [place], [place] [address]    REPTSTATUS 09/21/2017 FINAL 09/16/2017 1214   Ct Abdomen Pelvis Wo Contrast  Result Date: 10/02/2017 CLINICAL DATA:  Pt has sepsis Had recent enterococcus wound infection and also serratia UTI EXAM: CT ABDOMEN AND PELVIS WITHOUT CONTRAST TECHNIQUE: Multidetector CT imaging of the abdomen and pelvis was performed following the standard protocol without IV contrast. COMPARISON:  09/22/2017 FINDINGS: Lower chest: There is patchy opacity in the lower lobes at the lung bases, right greater than left. This is new since the prior CT. This may all be atelectasis. Pneumonia should be considered if there are consistent clinical findings. No pulmonary edema. No pleural effusion. Hepatobiliary: Normal liver. Gallbladder surgically absent. Mild prominence of common bile duct with normal distal tapering. Pancreas: No pancreatic mass or inflammation. Mild pancreatic atrophy. Spleen: Normal in size without focal abnormality. Adrenals/Urinary Tract: 17 mm left adrenal mass, stable from the prior CT. Left kidney is swollen with a poorly defined collecting system that appears at least mildly dilated. There is a nonobstructing stone in the lower pole. Right ureter is mildly dilated. It is not well visualized distally. No convincing ureteral stone. A nephrostomy catheter lies within  the right kidney. No significant right hydronephrosis. Right ureter is not well followed distally. No convincing ureteral stone. No renal masses. Bladder is decompressed with a Foley catheter. Nephrostomy tube on the left has been removed since the prior CT. Stomach/Bowel: No bowel obstruction or convincing inflammation. Right mid abdomen colostomy. Stomach is unremarkable. Vascular/Lymphatic: There are prominent retroperitoneal lymph nodes, none pathologically enlarged, all stable from the prior CT. No vascular abnormality. Reproductive: Uterus is surgically absent.  No discrete pelvic mass. Other: The presacral collection noted on the prior CT dated 09/15/2017, is no longer discretely seen. There is still soft tissue attenuation in this location adjacent to surgical vascular clips. The left posterior pelvic pigtail catheter has been removed since the prior exam. No significant ascites. Musculoskeletal: No fracture or acute finding. No osteoblastic or osteolytic lesions. IMPRESSION: 1. Since the prior CT, the left posterior pelvic pigtail catheter has been removed. This catheter had withdrawn into the gluteus maximus from the presacral collection seen on the CT dated 09/15/2017. 2. The presacral fluid collection, where the pigtail catheter had originally been placed, is no longer discretely visualized, although there is still soft tissue attenuation in the presacral space. The residual soft tissue attenuation may be postsurgical scarring/change. No definite residual or recurrent abscess. 3. There are new lower lobe airspace opacities at the lung bases, which may all be atelectasis. Pneumonia should be suspected if there are consistent clinical findings. 4. Left nephrostomy tube has been removed since the prior CT. Left kidney appears swollen with at least mild hydronephrosis. The proximal left ureter is mildly distended. The distal ureter is not well-defined. 5. No change in the appearance of the right kidney with  stable positioning of the right nephrostomy tube. 6. Changes from bowel surgery and formation of the right mid to upper quadrant colostomy are stable. Electronically Signed   By: Lajean Manes M.D.   On: 10/02/2017 10:13   Ct Head Wo Contrast  Result Date: 10/02/2017 CLINICAL DATA:  Altered mental status.  Nonverbal. EXAM: CT HEAD WITHOUT CONTRAST TECHNIQUE:  Contiguous axial images were obtained from the base of the skull through the vertex without intravenous contrast. COMPARISON:  None. FINDINGS: Brain: No evidence of acute infarction, hemorrhage, hydrocephalus, extra-axial collection or mass lesion/mass effect. Vascular: No hyperdense vessel or unexpected calcification. Skull: Normal. Negative for fracture or focal lesion. Sinuses/Orbits: Globes and orbits are unremarkable. There is a small amount of dependent fluid in the right sphenoid sinus. Sinuses otherwise clear. Clear mastoid air cells. Other: None. IMPRESSION: No intracranial abnormality. Electronically Signed   By: Lajean Manes M.D.   On: 10/02/2017 12:04   Dg Chest Portable 1 View  Result Date: 10/02/2017 CLINICAL DATA:  Lethargy, dyspnea EXAM: PORTABLE CHEST 1 VIEW COMPARISON:  08/31/2017 FINDINGS: Lungs are clear.  No pleural effusion or pneumothorax. The heart is normal in size. IMPRESSION: No evidence of acute cardiopulmonary disease. Electronically Signed   By: Julian Hy M.D.   On: 10/02/2017 08:12   No results found for this or any previous visit (from the past 240 hour(s)).  Microbiology: No results found for this or any previous visit (from the past 240 hour(s)).  Radiographs and labs were personally reviewed by me.   Bobby Rumpf, MD [facility] [phone] 10/02/2017, 2:55 PM

## 2017-10-02 NOTE — Progress Notes (Signed)
Pharmacy Antibiotic Note  [name] is a 62 y.o. female admitted on 10/02/2017 with sepsis.  Pharmacy has been consulted for vancomycin, levofloxacin, aztreonam dosing.  Patient afebrile, baseline labs are pending collection. Initial doses of antibiotics ordered.    Plan: Follow up morning labs, enter antibiotics based on results     Temp (24hrs), Avg:99.3 F (37.4 C), Min:99.3 F (37.4 C), Max:99.3 F (37.4 C)  No results for input(s): WBC, CREATININE, LATICACIDVEN, VANCOTROUGH, VANCOPEAK, VANCORANDOM, GENTTROUGH, GENTPEAK, GENTRANDOM, TOBRATROUGH, TOBRAPEAK, TOBRARND, AMIKACINPEAK, AMIKACINTROU, AMIKACIN in the last 168 hours.  Estimated Creatinine Clearance: 32.4 mL/min (A) (by C-G formula based on SCr of 1.85 mg/dL (H)).    Allergies  Allergen Reactions  . Penicillins Swelling    Facial swelling/childhood allergy Has patient had a PCN reaction causing immediate rash, facial/tongue/throat swelling, SOB or lightheadedness with hypotension: Yes Has patient had a PCN reaction causing severe rash involving mucus membranes or skin necrosis: Yes Has patient had a PCN reaction that required hospitalization yes Has patient had a PCN reaction occurring within the last 10 years: No If all of the above answers are "NO", then may proceed with Cephalosporin use.   . Adhesive [Tape]     blisters  . Cefaclor Rash    Ceclor  . Erythromycin     Gastritis, abd cramps  . Trimethoprim Rash  . Ultram [Tramadol] Hives  . Ciprofloxacin Other (See Comments)    Unknown; On Dr notes  During 08/2017 admission, patient tolerated 7 day course of IV Cipro.  . Fluconazole Rash  . Oxycodone     " I just feel weird"  . Pectin Rash    Pectin ring for stoma  . Sulfa Antibiotics Rash   Thank you for allowing pharmacy to be a part of this patient's care.  Erin Hearing PharmD., BCPS Clinical Pharmacist 10/02/2017 7:32 AM

## 2017-10-02 NOTE — ED Notes (Addendum)
Pt transported to CT. Approx 250 mL of fluid left to infuse from pt ordered bolus

## 2017-10-02 NOTE — ED Notes (Signed)
IV team at bedside 

## 2017-10-02 NOTE — Progress Notes (Signed)
Pharmacy Antibiotic Note  [name] is a 62 y.o. female admitted on 10/02/2017 with sepsis.  Pharmacy has been consulted for vancomycin, Levaquin, aztreonam dosing.  Nephrostomy tube draining purulent liquid. Foley cath in place draining cloudy urine. WBC low at 2.9. AKI. SCr elevated at 3.74, CrCl ~87ml/min.  Had recent enterococcus wound infection and also serratia UTI.  Plan: Continue aztreonam 500mg  IV Q8h Continue vancomycin 1g IV Q48h Continue Levaquin 500mg  IV Q48h Monitor clinical picture, renal function, VT prn F/U C&S, abx deescalation / LOT   Temp (24hrs), Avg:99.3 F (37.4 C), Min:99.3 F (37.4 C), Max:99.3 F (37.4 C)  No results for input(s): WBC, CREATININE, LATICACIDVEN, VANCOTROUGH, VANCOPEAK, VANCORANDOM, GENTTROUGH, GENTPEAK, GENTRANDOM, TOBRATROUGH, TOBRAPEAK, TOBRARND, AMIKACINPEAK, AMIKACINTROU, AMIKACIN in the last 168 hours.  Estimated Creatinine Clearance: 32.4 mL/min (A) (by C-G formula based on SCr of 1.85 mg/dL (H)).    Allergies  Allergen Reactions  . Penicillins Swelling    Facial swelling/childhood allergy Has patient had a PCN reaction causing immediate rash, facial/tongue/throat swelling, SOB or lightheadedness with hypotension: Yes Has patient had a PCN reaction causing severe rash involving mucus membranes or skin necrosis: Yes Has patient had a PCN reaction that required hospitalization yes Has patient had a PCN reaction occurring within the last 10 years: No If all of the above answers are "NO", then may proceed with Cephalosporin use.   . Adhesive [Tape]     blisters  . Cefaclor Rash    Ceclor  . Erythromycin     Gastritis, abd cramps  . Trimethoprim Rash  . Ultram [Tramadol] Hives  . Ciprofloxacin Other (See Comments)    Unknown; On Dr notes  During 08/2017 admission, patient tolerated 7 day course of IV Cipro.  . Fluconazole Rash  . Oxycodone     " I just feel weird"  . Pectin Rash    Pectin ring for stoma  . Sulfa Antibiotics  Rash    Thank you for allowing pharmacy to be a part of this patient's care.  Reginia Naas 10/02/2017 8:02 AM

## 2017-10-02 NOTE — Progress Notes (Signed)
Pharmacy Antibiotic Note  [name] is a 62 y.o. female admitted on 10/02/2017 with AMS concerning for sepsis of possible PNA or UTI source. ID on board. Pharmacy has been consulted for Daptomycin and Meropenem dosing.  Noted AoCKD (BL 1.8-2, admit 3.74), estimated CrCl<20 ml/min. Will wait and start Dapto on 3/4 since Vanc load given today  Plan: 1. Daptomycin 500 mg (~6 mg/kg) IV every 48 hours to start on 3/4 2. Meropenem 500 mg IV every 12 hours 3. Will continue to follow renal function, culture results, LOT, and antibiotic de-escalation plans      Temp (24hrs), Avg:99.3 F (37.4 C), Min:99.3 F (37.4 C), Max:99.3 F (37.4 C)  Recent Labs  Lab 10/02/17 0729 10/02/17 1231  WBC 2.9*  --   CREATININE 3.74*  --   LATICACIDVEN  --  0.9    Estimated Creatinine Clearance: 16 mL/min (A) (by C-G formula based on SCr of 3.74 mg/dL (H)).    Allergies  Allergen Reactions  . Penicillins Swelling    Facial swelling/childhood allergy Has patient had a PCN reaction causing immediate rash, facial/tongue/throat swelling, SOB or lightheadedness with hypotension: Yes Has patient had a PCN reaction causing severe rash involving mucus membranes or skin necrosis: Yes Has patient had a PCN reaction that required hospitalization yes Has patient had a PCN reaction occurring within the last 10 years: No If all of the above answers are "NO", then may proceed with Cephalosporin use.   . Adhesive [Tape]     blisters  . Cefaclor Rash    Ceclor  . Erythromycin Other (See Comments)    Gastritis, abd cramps  . Trimethoprim Rash  . Ultram [Tramadol] Hives  . Fluconazole Rash  . Oxycodone Other (See Comments)    " I just feel weird"  . Pectin Rash    Pectin ring for stoma  . Sulfa Antibiotics Rash    Antimicrobials this admission: Dapto 3/3 >> Mero 3/3 >>  Dose adjustments this admission: n/a  Microbiology results: 3/3 BCx >> 3/3 UCx >> 3/3 RCx >>  Thank you for allowing pharmacy  to be a part of this patient's care.  Lawson Radar 10/02/2017 3:49 PM

## 2017-10-02 NOTE — ED Provider Notes (Signed)
[facility] EMERGENCY DEPARTMENT Provider Note   CSN: [id number] Arrival date & time: 10/02/17  8786     History   Chief Complaint Chief Complaint  Patient presents with  . Altered Mental Status    HPI [name] is a 62 y.o. female.  HPI   52yF sent for evaluation of lethargy. Apparently worsening over the past two days. Pt with recent extended admission from 1/28-2/24. History is primarily from husband from husband at bedside and review of records. I also saw her on an ED visit in [place] in October for issues with her PICC line. She has unfortunately significantly declined from what I remember then.   He reports that she seemed to improve for a couple days post discharge until progressively becoming more somnolent. Reports pain meds were being weaned down because of this. Fentanyl from 42mg to 553m/hr and dilaudid decreasing. She worsened despite this. Not just tired but also seemed confused. Reaching out for things that weren't there. Speech getting softer and barely eating drinking. He reports a dose of dilaudid and lyrica with some applesauce around midnight was the only thing he thinks she has had in the past 36 hours. She has been having leaking of urine from L urostomy and back continually soaked for the past ~day.  Past Medical History:  Diagnosis Date  . Adrenal adenoma, left 02/08/2016   CT: stable benign  . Anemia in neoplastic disease   . Benign essential HTN   . 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 [facility] corpus [facility]i, except isthmus [facility] dr roDenman Georgend dr goAlvy Bimler  10-15-2004  dx endometroid endometrial and ovarian cancer s/p  chemotheapy and surgery(TAH w/ BSO) :  recurrent 11-19-2014 post pelvic surgery and radiation 01-29-2015 to 03-10-2015    . Chronic idiopathic neutropenia (HCC)    presumed related to chemotherapy March 2006--- followed by dr goAlvy Bimlertreatment w/ G-CSF injections  . Chronic nausea   . Chronic pain    perineal/ anal  area from bladder pad irritates skin , right flank pain  . CKD stage G2/A3, GFR 60-89 and albumin creatinine ratio >300 mg/g    nephrologist-  dr elMadelon Lips. Diabetic retinopathy, background ([place]  . Difficult intravenous access    small veins--- hx PICC lines  . DM type 2 (diabetes mellitus, type 2) ([facility]   monitored by dr miLegrand Comoltheimer  . Dysuria   . Environmental and seasonal allergies   . Fatty liver 02/08/2016   CT  . Generalized muscle weakness   . GERD (gastroesophageal reflux disease)   . Hiatal hernia   . History of abdominal abscess 04/16/2017   post surgery 04-01-2017  --- resolved 10/ 2018  . History of gastric polyp    2014  duodenum  . History of ileus 04/16/2017   resolved w/ no surgical intervention  . History of radiation therapy    01-29-2015 to 03-10-2015  pelvis 50.4Gy  . Hypothyroidism    monitored by dr miLegrand Comoltheimer  . Ile[facility] in place ([facility]   created at same time colostomy takedown.  . Lower urinary tract symptoms (LUTS)    urge urinary  incontinence  . Mixed dyslipidemia   . Multiple thyroid nodules    Managed by Dr. GeHarlow Asa.  Pelvic abscess in female 04/16/2017  . PONV (postoperative nausea and vomiting)    "scopolamine patch works for me"  . Radiation-induced dermatitis    contact dermatitis , radiation completed, rash only on ankles now.  . Seasonal allergies   . Ureteral stricture, right UROLOGIT-  DR [facility]   CHRONIC--  TREATMENT URETERAL STENT  . 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 Vitamin D deficiency   . Wears glasses     Patient Active Problem List   Diagnosis Date Noted  . MDD (major depressive disorder), single episode, moderate ([place])   . Poorly controlled diabetes mellitus ([place]) 08/31/2017  .  Pressure injury of skin 08/31/2017  . 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 History of external beam radiation therapy to pelvis 08/31/2017  . ARF (acute renal failure) ([place]) 08/30/2017  . Adjustment disorder with mixed anxiety and depressed mood 08/30/2017  . Dehydration 08/29/2017  . Chronic kidney disease (CKD), stage III (moderate) ([place]) 08/24/2017  . Wound of right buttock 08/10/2017  . 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 Protein-calorie malnutrition, moderate ([place]) 04/18/2017  . Postoperative anemia 04/04/2017  . 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 Hot flashes related to aromatase inhibitor therapy 03/18/2017  . 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 Vitamin D insufficiency 08/30/2016  . Breast cancer of upper-inner quadrant of left female breast ([place])   . Pelvic pain in female 03/19/2015  . Chronic anemia 12/30/2014  . UTI (urinary tract infection)   . Pancytopenia, acquired ([place]) 11/26/2014  . Morbid obesity ([place]) 11/20/2014  . 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 Abdominal pain 09/09/2014  . Postmenopausal bleeding 09/05/2014  . History of ovarian & endometrial cancer 07/17/2012  . 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[place]) 05/10/2012  . Chronic neutropenia ([place]) 12/14/2011  . Primary hypothyroidism 12/14/2011  . DM type 2 (diabetes mellitus, type 2) ([place]) 12/14/2011  . Benign essential HTN 12/14/2011    Past Surgical History:  Procedure Laterality Date  . APPENDECTOMY    . BREAST RECONSTRUCTION WITH PLACEMENT OF TISSUE EXPANDER AND FLEX HD (ACELLULAR HYDRATED DERMIS) Bilateral 12/11/2015   Procedure: BILATERAL BREAST RECONSTRUCTION WITH PLACEMENT OF TISSUE EXPANDERS;  Surgeon: Irene Limbo, MD;  Location: [facility];  Service: Plastics;  Laterality: Bilateral;  . COLONOSCOPY WITH PROPOFOL N/A 08/21/2013   Procedure: COLONOSCOPY WITH PROPOFOL;  Surgeon: Cleotis Nipper, MD;  [facility];  Service: Endoscopy;  Laterality: N/A;  . COLOSTOMY TAKEDOWN N/A 12/04/2014   Procedure: LAPROSCOPIC LYSIS OF ADHESIONS, SPLENIC MOBILIZATION, RELOCATION OF COLOSTOMY, DEBRIDEMENT INITIAL COLOSTOMY SITE;  Surgeon: Michael [name], MD;  [facility];  Service: General;  Laterality: N/A;  . CYSTOGRAM N/A 06/01/2017   Procedure: CYSTOGRAM;  Surgeon: Alexis Frock, MD;  [facility];  Service: Urology;  Laterality: N/A;  . CYSTOSCOPY W/ RETROGRADES Right 11/21/2015   Procedure: CYSTOSCOPY WITH RETROGRADE PYELOGRAM;  Surgeon: Alexis Frock, MD;  [facility];  Service: Urology;  Laterality: Right;  . CYSTOSCOPY W/ URETERAL STENT PLACEMENT Right 11/21/2015   Procedure: CYSTOSCOPY WITH STENT REPLACEMENT;  Surgeon: Alexis Frock, MD;  [facility];  Service: Urology;  Laterality: Right;  . CYSTOSCOPY W/ URETERAL STENT PLACEMENT Right 03/10/2016   Procedure: CYSTOSCOPY WITH STENT REPLACEMENT;  Surgeon: Alexis Frock, MD;  Location: [facility];  Service: Urology;  Laterality: Right;  .  CYSTOSCOPY W/ URETERAL STENT PLACEMENT Right 06/30/2016   Procedure: CYSTOSCOPY WITH RETROGRADE PYELOGRAM/URETERAL STENT EXCHANGE;  Surgeon: Alexis Frock, MD;  Location: [facility];  Service: Urology;  Laterality: Right;  . CYSTOSCOPY W/ URETERAL STENT PLACEMENT N/A 06/01/2017   Procedure: CYSTOSCOPY WITH EXAM UNDER ANESTHESIA;  Surgeon: Alexis Frock, MD;  [facility];  Service: Urology;  Laterality: N/A;  . CYSTOSCOPY W/ URETERAL STENT PLACEMENT Right 08/17/2017   Procedure: CYSTOSCOPY WITH RETROGRADE PYELOGRAM/URETERAL STENT REMOVAL;  Surgeon: Alexis Frock, MD;  Location: [facility];  Service: Urology;  Laterality: Right;  . CYSTOSCOPY WITH  RETROGRADE PYELOGRAM, URETEROSCOPY AND STENT PLACEMENT Right 03/20/2015   Procedure: CYSTOSCOPY WITH RETROGRADE PYELOGRAM, URETEROSCOPY WITH BALLOON DILATION AND STENT PLACEMENT ON RIGHT;  Surgeon: Alexis Frock, MD;  [facility];  Service: Urology;  Laterality: Right;  . CYSTOSCOPY WITH RETROGRADE PYELOGRAM, URETEROSCOPY AND STENT PLACEMENT Right 05/02/2015   Procedure: CYSTOSCOPY WITH RIGHT RETROGRADE PYELOGRAM,  DIAGNOSTIC URETEROSCOPY AND STENT PULL ;  Surgeon: Alexis Frock, MD;  Location: [facility];  Service: Urology;  Laterality: Right;  . CYSTOSCOPY WITH RETROGRADE PYELOGRAM, URETEROSCOPY AND STENT PLACEMENT Right 09/05/2015   Procedure: CYSTOSCOPY WITH RETROGRADE PYELOGRAM,  AND STENT PLACEMENT;  Surgeon: Alexis Frock, MD;  [facility];  Service: Urology;  Laterality: Right;  . CYSTOSCOPY WITH RETROGRADE PYELOGRAM, URETEROSCOPY AND STENT PLACEMENT Right 04/01/2017   Procedure: CYSTOSCOPY WITH RETROGRADE PYELOGRAM, URETEROSCOPY AND STENT PLACEMENT;  Surgeon: Alexis Frock, MD;  [facility];  Service: Urology;  Laterality: Right;  . CYSTOSCOPY WITH STENT PLACEMENT Right 10/27/2016   Procedure: CYSTOSCOPY WITH STENT CHANGE and right retrograde pyelogram;  Surgeon: Alexis Frock, MD;  Location[facility];  Service: Urology;  Laterality: Right;  . EUS N/A 10/02/2014   Procedure: LOWER ENDOSCOPIC ULTRASOUND (EUS);  Surgeon: Arta Silence, MD;  [facility];  Service: Endoscopy;  Laterality: N/A;  . EXCISION SOFT TISSUE MASS RIGHT FOREMAN  12-08-2006  . EYE SURGERY  as child   pytosis of eyelids repair  . INCISION AND DRAINAGE OF WOUND Bilateral 12/26/2015   Procedure: DEBRIDEMENT OF BILATERAL MASTECTOMY FLAPS;  Surgeon: Irene Limbo, MD;  Location: [facility];  Service: Plastics;  Laterality: Bilateral;  . IR CV LINE INJECTION  05/31/2017  . IR FLUORO GUIDE CV LINE LEFT  05/31/2017  . IR FLUORO  GUIDE CV LINE RIGHT  04/06/2017  . IR FLUORO GUIDE CV MIDLINE PICC RIGHT  05/30/2017  . IR NEPHROSTOGRAM LEFT INITIAL PLACEMENT  09/02/2017  . IR NEPHROSTOGRAM RIGHT INITIAL PLACEMENT  09/02/2017  . IR NEPHROSTOGRAM RIGHT THRU EXISTING ACCESS  09/13/2017  . IR RADIOLOGIST EVAL & MGMT  05/03/2017  . IR US GUIDE VASC ACCESS LEFT  05/31/2017  . IR US GUIDE VASC ACCESS RIGHT  04/06/2017  . IR US GUIDE VASC ACCESS RIGHT  05/30/2017  . LAPAROSCOPIC CHOLECYSTECTOMY  1990  . LIPOSUCTION WITH LIPOFILLING Bilateral 04/16/2016   Procedure: LIPOSUCTION WITH LIPOFILLING TO BILATERAL CHEST;  Surgeon: Irene Limbo, MD;  Location: [facility];  Service: Plastics;  Laterality: Bilateral;  . MASTECTOMY W/ SENTINEL NODE BIOPSY Bilateral 12/11/2015   Procedure: RIGHT PROPHYLACTIC MASTECTOMY, LEFT TOTAL MASTECTOMY WITH LEFT AXILLARY SENTINEL LYMPH NODE BIOPSY;  Surgeon: Stark Klein, MD;  Location: [facility];  Service: General;  Laterality: Bilateral;  . OSTOMY N/A 11/19/2014   Procedure: OSTOMY;  Surgeon: Michael [name], MD;  [facility];  Service: General;  Laterality: N/A;  . PROCTOSCOPY N/A  04/01/2017   Procedure: RIDGE PROCTOSCOPY;  Surgeon: Michael [name], MD;  [facility];  Service: General;  Laterality: N/A;  . REMOVAL OF BILATERAL TISSUE EXPANDERS WITH PLACEMENT OF BILATERAL BREAST IMPLANTS Bilateral 04/16/2016   Procedure: REMOVAL OF BILATERAL TISSUE EXPANDERS WITH PLACEMENT OF BILATERAL BREAST IMPLANTS;  Surgeon: Irene Limbo, MD;  Location: [facility];  Service: Plastics;  Laterality: Bilateral;  . ROBOTIC ASSISTED LAP VAGINAL HYSTERECTOMY N/A 11/19/2014   Procedure: ROBOTIC LYSIS OF ADHESIONS, CONVERTED TO LAPAROTOMY RADICAL UPPER VAGINECTOMY,LOW ANTERIOR BOWEL RESECTION, COLOSTOMY, BILATERAL URETERAL STENT PLACEMENT AND CYSTONOMY CLOSURE;  Surgeon: Everitt Amber, MD;  [facility];  Service: Gynecology;  Laterality: N/A;  . TISSUE EXPANDER FILLING Bilateral 12/26/2015    Procedure: EXPANSION OF BILATERAL CHEST TISSUE EXPANDERS (60 mL- Right; 75 mL- Left);  Surgeon: Irene Limbo, MD;  Location: [facility];  Service: Plastics;  Laterality: Bilateral;  . TONSILLECTOMY    . TOTAL ABDOMINAL HYSTERECTOMY  March 2006   [facility]   and Bilateral Salpingoophorectomy/  staging for Ovarian cancer/  an  . XI ROBOTIC ASSISTED LOWER ANTERIOR RESECTION N/A 04/01/2017   Procedure: XI ROBOTIC VS LAPAROSCOPIC COLOSTOMY TAKEDOWN WITH LYSIS OF ADHESIONS.;  Surgeon: Michael [name], MD;  [facility];  Service: General;  Laterality: N/A;  [facility] PATHWAY    OB History    No data available       Home Medications    Prior to Admission medications   Medication Sig Start Date End Date Taking? Authorizing Provider  acetaminophen (TYLENOL) 325 MG tablet Take 2 tablets (650 mg total) by mouth every 6 (six) hours as needed. 09/24/17   Roxan Hockey, MD  ALPRAZolam Duanne Moron) 0.5 MG tablet Take 1 tablet (0.5 mg total) by mouth 3 (three) times daily as needed for anxiety. 09/24/17   Roxan Hockey, MD  anastrozole (ARIMIDEX) 1 MG tablet Take 1 tablet (1 mg total) by mouth daily. 09/24/17   Roxan Hockey, MD  artificial tears (LACRILUBE) OINT ophthalmic ointment Place into both eyes at bedtime. 09/24/17   Roxan Hockey, MD  Biotin 5 MG TABS Take 5 mg by mouth every morning.     [provider]  Cholecalciferol (VITAMIN D3) 10000 UNITS capsule Take 10,000 Units by mouth once a week. Sunday evening's    [provider]  ciprofloxacin (CIPRO) 500 MG tablet Take 1 tablet (500 mg total) by mouth 2 (two) times daily. 09/24/17   Roxan Hockey, MD  diphenhydrAMINE (BENADRYL) 25 mg capsule Take 1 capsule (25 mg total) by mouth every 8 (eight) hours as needed for itching, allergies or sleep. 09/24/17   Roxan Hockey, MD  diphenoxylate-atropine (LOMOTIL) 2.5-0.025 MG tablet Take 1 tablet by mouth 4 (four) times daily as needed for diarrhea or loose stools. TO  PREVENT LOOSE BOWEL MOVEMENTS 09/24/17   Roxan Hockey, MD  DULoxetine (CYMBALTA) 30 MG capsule Take 1 capsule (30 mg total) by mouth 2 (two) times daily. 09/24/17   Roxan Hockey, MD  famotidine-calcium carbonate-magnesium hydroxide (PEPCID COMPLETE) 10-800-165 MG chewable tablet Chew 1 tablet by mouth daily as needed (INDIGESTION). 09/24/17   Roxan Hockey, MD  fentaNYL (DURAGESIC - DOSED MCG/HR) 75 MCG/HR Place 1 patch (75 mcg total) onto the skin every 3 (three) days. 09/25/17   Roxan Hockey, MD  ferrous sulfate 325 (65 FE) MG tablet Take 1 tablet (325 mg total) by mouth at bedtime. 09/24/17   Roxan Hockey, MD  filgrastim (NEUPOGEN) 480 MCG/1.6ML injection Inject 480 mcg/1.6 ml under the skin every  6 days for life (last dose 09/22/17) 09/24/17   Emokpae, Courage, MD  Hydrocortisone (GERHARDT'S BUTT CREAM) CREA Apply 1 application topically 4 (four) times daily. 09/24/17   Roxan Hockey, MD  HYDROmorphone (DILAUDID) 4 MG tablet Take 1.5 tablets (6 mg total) by mouth every 3 (three) hours as needed for moderate pain or severe pain. 09/24/17   Roxan Hockey, MD  insulin aspart (NOVOLOG) 100 UNIT/ML injection Inject 0-20 Units into the skin 3 (three) times daily with meals. 09/24/17   Roxan Hockey, MD  insulin aspart (NOVOLOG) 100 UNIT/ML injection Inject 7 Units into the skin 3 (three) times daily with meals. 09/24/17   Roxan Hockey, MD  insulin glargine (LANTUS) 100 UNIT/ML injection Inject 0.25 mLs (25 Units total) into the skin 2 (two) times daily. 09/24/17   Roxan Hockey, MD  levothyroxine (SYNTHROID, LEVOTHROID) 150 MCG tablet Take 150 mcg by mouth daily before breakfast.     [provider]  levothyroxine (SYNTHROID, LEVOTHROID) 150 MCG tablet Take 1 tablet (150 mcg total) by mouth daily before breakfast. 09/25/17   Emokpae, Courage, MD  lidocaine (LIDODERM) 5 % Place 1 patch onto the skin daily. Remove & Discard patch within 12 hours 09/25/17   Roxan Hockey, MD    lip balm (CARMEX) ointment Apply 1 application topically 2 (two) times daily. 09/24/17   Roxan Hockey, MD  loperamide (IMODIUM) 2 MG capsule Take 1-2 capsules (2-4 mg total) by mouth every 8 (eight) hours as needed for diarrhea or loose stools (Use if >2 BM every 8 hours). 09/24/17   Roxan Hockey, MD  loratadine (CLARITIN) 10 MG tablet Take 10 mg by mouth every morning.     [provider]  methocarbamol (ROBAXIN) 750 MG tablet Take 1 tablet (750 mg total) by mouth 3 (three) times daily. 09/24/17   Roxan Hockey, MD  omega-3 acid ethyl esters (LOVAZA) 1 G capsule Take 1 g by mouth 2 (two) times daily.     [provider]  ondansetron (ZOFRAN-ODT) 4 MG disintegrating tablet Take 4 mg by mouth every 6 (six) hours as needed for nausea or vomiting.  04/12/17   [provider]  Polyethyl Glycol-Propyl Glycol (SYSTANE OP) Place 1 drop into both eyes at bedtime.     [provider]  polyvinyl alcohol (LIQUIFILM TEARS) 1.4 % ophthalmic solution Place 1 drop into both eyes at bedtime. 09/24/17   Roxan Hockey, MD  pregabalin (LYRICA) 75 MG capsule Take 1 capsule (75 mg total) by mouth 2 (two) times daily. 09/24/17   Roxan Hockey, MD  Prenatal Vit-Fe Fumarate-FA (PRENATAL VITAMIN PO) Take 1 capsule by mouth daily. Takes prenatal because there are no dyes in it    [provider]  prochlorperazine (COMPAZINE) 10 MG tablet Take 1 tablet (10 mg total) by mouth 2 (two) times daily as needed for nausea or vomiting. Patient taking differently: Take 10 mg by mouth 2 (two) times daily as needed for nausea or vomiting.  05/28/17   Virgel Manifold, MD  promethazine (PHENERGAN) 25 MG tablet Take 1 tablet (25 mg total) by mouth every 6 (six) hours as needed for nausea or vomiting. 05/28/17   Virgel Manifold, MD  protein supplement shake (PREMIER PROTEIN) LIQD Take 325 mLs (11 oz total) by mouth 2 (two) times daily as needed (Pt to request). 09/24/17   Roxan Hockey,  MD  psyllium (HYDROCIL/METAMUCIL) 95 % PACK Take 1 packet by mouth 2 (two) times daily. 09/24/17   Roxan Hockey, MD  ranitidine (ZANTAC) 150  MG tablet Take 1 tablet (150 mg total) by mouth 2 (two) times daily as needed for heartburn. 09/24/17   Roxan Hockey, MD  rosuvastatin (CRESTOR) 10 MG tablet Take 10 mg by mouth every evening.     [provider]  sertraline (ZOLOFT) 50 MG tablet Take 1.5 tablets (75 mg total) by mouth daily. 09/24/17   Roxan Hockey, MD  sitaGLIPtin (JANUVIA) 100 MG tablet Take 100 mg by mouth every morning.     [provider]  traZODone (DESYREL) 100 MG tablet Take 1 tablet (100 mg total) by mouth at bedtime as needed for sleep. 09/24/17   Roxan Hockey, MD    Family History Family History  Problem Relation Age of Onset  . Cancer Mother 52       stomach ca  . Hypertension Mother   . Cancer Father 2       prostate ca  . Diabetes Father   . Heart disease Father        CABG  . Breast cancer Maternal Aunt        dx in her 39s  . Lymphoma Paternal Aunt   . Brain cancer Paternal Grandfather   . Ovarian cancer Other   . Diabetes Sister   . Hypertension Brother y-10  . Heart disease Brother        CABG  . Diabetes Brother     Social History Social History   Tobacco Use  . Smoking status: Never Smoker  . Smokeless tobacco: Never Used  Substance Use Topics  . Alcohol use: Yes    Comment: rare social  . Drug use: No     Allergies   Penicillins; Adhesive [tape]; Cefaclor; Erythromycin; Trimethoprim; Ultram [tramadol]; Ciprofloxacin; Fluconazole; Oxycodone; Pectin; and Sulfa antibiotics   Review of Systems Review of Systems  Level 5 caveat because pt is essentialy nonverbal.   Physical Exam Updated Vital Signs BP (!) 120/58 (BP Location: Right Arm)   Pulse 99   Temp 99.3 F (37.4 C) (Rectal)   Resp 18   SpO2 99%   Physical Exam  Constitutional:  Laying in bed with eyes open. Occasionally moaning.  HENT:  Head:  Normocephalic and atraumatic.  Eyes: Conjunctivae are normal. Pupils are equal, round, and reactive to light. Right eye exhibits no discharge. Left eye exhibits no discharge.  Neck: Neck supple.  Cardiovascular: Normal rate, regular rhythm and normal heart sounds. Exam reveals no gallop and no friction rub.  No murmur heard. Pulmonary/Chest: No respiratory distress.  Snowing respirations. Decreased breath sounds b/l.   Abdominal: Soft. She exhibits no distension. There is no tenderness.  Ileostomy RUQ with watery material in bag  Genitourinary:  Genitourinary Comments: Foley. B/l nephrostomies. L nephrostomy tube completely out with tubing just held against back by bandage. Macerated skin surrounding the ostomy.   Musculoskeletal: She exhibits no edema or tenderness.  Neurological:  Lethargic. Will respond to questioning but whispered voice and most answers not readily understandable.   Skin: Skin is warm and dry.  Nursing note and vitals reviewed.    ED Treatments / Results  Labs (all labs ordered are listed, but only abnormal results are displayed) Labs Reviewed  COMPREHENSIVE METABOLIC PANEL - Abnormal; Notable for the following components:      Result Value   CO2 14 (*)    Glucose, Bld 123 (*)    BUN 52 (*)    Creatinine, Ser 3.74 (*)    Albumin 1.9 (*)    Alkaline Phosphatase 248 (*)  GFR calc non Af Amer 12 (*)    GFR calc Af Amer 14 (*)    All other components within normal limits  URINALYSIS, ROUTINE W REFLEX MICROSCOPIC - Abnormal; Notable for the following components:   APPearance CLOUDY (*)    Hgb urine dipstick MODERATE (*)    Ketones, ur 15 (*)    Protein, ur >300 (*)    Leukocytes, UA LARGE (*)    All other components within normal limits  CBC WITH DIFFERENTIAL/PLATELET - Abnormal; Notable for the following components:   WBC 2.9 (*)    RBC 3.40 (*)    Hemoglobin 9.9 (*)    HCT 32.2 (*)    RDW 16.9 (*)    Neutro Abs 1.5 (*)    All other components within  normal limits  URINALYSIS, MICROSCOPIC (REFLEX) - Abnormal; Notable for the following components:   Bacteria, UA MANY (*)    Squamous Epithelial / LPF 0-5 (*)    All other components within normal limits  CBG MONITORING, ED - Abnormal; Notable for the following components:   Glucose-Capillary 100 (*)    All other components within normal limits  CULTURE, BLOOD (ROUTINE X 2)  CULTURE, BLOOD (ROUTINE X 2)  URINE CULTURE  LACTIC ACID, PLASMA    EKG  EKG Interpretation  Date/Time:  Sunday October 02 2017 08:15:56 EST Ventricular Rate:  103 PR Interval:    QRS Duration: 81 QT Interval:  340 QTC Calculation: 445 R Axis:   102 Text Interpretation:  Sinus tachycardia Right axis deviation Low voltage, precordial leads Confirmed by Virgel Manifold [phone]) on 10/02/2017 9:20:18 AM       Radiology Ct Abdomen Pelvis Wo Contrast  Result Date: 10/02/2017 CLINICAL DATA:  Pt has sepsis Had recent enterococcus wound infection and also serratia UTI EXAM: CT ABDOMEN AND PELVIS WITHOUT CONTRAST TECHNIQUE: Multidetector CT imaging of the abdomen and pelvis was performed following the standard protocol without IV contrast. COMPARISON:  09/22/2017 FINDINGS: Lower chest: There is patchy opacity in the lower lobes at the lung bases, right greater than left. This is new since the prior CT. This may all be atelectasis. Pneumonia should be considered if there are consistent clinical findings. No pulmonary edema. No pleural effusion. Hepatobiliary: Normal liver. Gallbladder surgically absent. Mild prominence of common bile duct with normal distal tapering. Pancreas: No pancreatic mass or inflammation. Mild pancreatic atrophy. Spleen: Normal in size without focal abnormality. Adrenals/Urinary Tract: 17 mm left adrenal mass, stable from the prior CT. Left kidney is swollen with a poorly defined collecting system that appears at least mildly dilated. There is a nonobstructing stone in the lower pole. Right ureter is mildly  dilated. It is not well visualized distally. No convincing ureteral stone. A nephrostomy catheter lies within the right kidney. No significant right hydronephrosis. Right ureter is not well followed distally. No convincing ureteral stone. No renal masses. Bladder is decompressed with a Foley catheter. Nephrostomy tube on the left has been removed since the prior CT. Stomach/Bowel: No bowel obstruction or convincing inflammation. Right mid abdomen colostomy. Stomach is unremarkable. Vascular/Lymphatic: There are prominent retroperitoneal lymph nodes, none pathologically enlarged, all stable from the prior CT. No vascular abnormality. Reproductive: Uterus is surgically absent.  No discrete pelvic mass. Other: The presacral collection noted on the prior CT dated 09/15/2017, is no longer discretely seen. There is still soft tissue attenuation in this location adjacent to surgical vascular clips. The left posterior pelvic pigtail catheter has been removed since the prior exam.  No significant ascites. Musculoskeletal: No fracture or acute finding. No osteoblastic or osteolytic lesions. IMPRESSION: 1. Since the prior CT, the left posterior pelvic pigtail catheter has been removed. This catheter had withdrawn into the gluteus maximus from the presacral collection seen on the CT dated 09/15/2017. 2. The presacral fluid collection, where the pigtail catheter had originally been placed, is no longer discretely visualized, although there is still soft tissue attenuation in the presacral space. The residual soft tissue attenuation may be postsurgical scarring/change. No definite residual or recurrent abscess. 3. There are new lower lobe airspace opacities at the lung bases, which may all be atelectasis. Pneumonia should be suspected if there are consistent clinical findings. 4. Left nephrostomy tube has been removed since the prior CT. Left kidney appears swollen with at least mild hydronephrosis. The proximal left ureter is  mildly distended. The distal ureter is not well-defined. 5. No change in the appearance of the right kidney with stable positioning of the right nephrostomy tube. 6. Changes from bowel surgery and formation of the right mid to upper quadrant colostomy are stable. Electronically Signed   By: Lajean Manes M.D.   On: 10/02/2017 10:13   Ct Head Wo Contrast  Result Date: 10/02/2017 CLINICAL DATA:  Altered mental status.  Nonverbal. EXAM: CT HEAD WITHOUT CONTRAST TECHNIQUE: Contiguous axial images were obtained from the base of the skull through the vertex without intravenous contrast. COMPARISON:  None. FINDINGS: Brain: No evidence of acute infarction, hemorrhage, hydrocephalus, extra-axial collection or mass lesion/mass effect. Vascular: No hyperdense vessel or unexpected calcification. Skull: Normal. Negative for fracture or focal lesion. Sinuses/Orbits: Globes and orbits are unremarkable. There is a small amount of dependent fluid in the right sphenoid sinus. Sinuses otherwise clear. Clear mastoid air cells. Other: None. IMPRESSION: No intracranial abnormality. Electronically Signed   By: Lajean Manes M.D.   On: 10/02/2017 12:04   Dg Chest Portable 1 View  Result Date: 10/02/2017 CLINICAL DATA:  Lethargy, dyspnea EXAM: PORTABLE CHEST 1 VIEW COMPARISON:  08/31/2017 FINDINGS: Lungs are clear.  No pleural effusion or pneumothorax. The heart is normal in size. IMPRESSION: No evidence of acute cardiopulmonary disease. Electronically Signed   By: Julian Hy M.D.   On: 10/02/2017 08:12    Procedures Procedures (including critical care time)  Medications Ordered in ED Medications  vancomycin (VANCOCIN) IVPB 1000 mg/200 mL premix (not administered)  levofloxacin (LEVAQUIN) IVPB 500 mg (not administered)  aztreonam (AZACTAM) 500 mg in dextrose 5 % 50 mL IVPB (not administered)  HYDROmorphone (DILAUDID) injection 1 mg (1 mg Intravenous Given 10/02/17 0738)  sodium chloride 0.9 % bolus 1,000 mL (0 mLs  Intravenous Stopped 10/02/17 0935)  levofloxacin (LEVAQUIN) IVPB 750 mg (0 mg Intravenous Stopped 10/02/17 0915)  sodium chloride 0.9 % bolus 1,000 mL (0 mLs Intravenous Stopped 10/02/17 1309)  aztreonam (AZACTAM) 2 g in sodium chloride 0.9 % 100 mL IVPB (0 g Intravenous Stopped 10/02/17 1007)  vancomycin (VANCOCIN) 1,500 mg in sodium chloride 0.9 % 500 mL IVPB (0 mg Intravenous Stopped 10/02/17 1243)     Initial Impression / Assessment and Plan / ED Course  I have reviewed the triage vital signs and the nursing notes.  Pertinent labs & imaging results that were available during my care of the patient were reviewed by me and considered in my medical decision making (see chart for details).     With HR>90, WBC <4 and lethargy she was empirically for sepsis. UA noted but likely chronically looks like this. Foley was  changed. IR consultation for L nephrostomy tube. CT w/o acute surgical process otherwise. Ordered abx should cover for possible HCAP noted on imaging. Not hypoxic. IVF for acute on chronic renal impairment. Admission.   Husband clearly frustrated but with many good questions. I spent an extended amount of time at bedside answering them the best I could. Many specific urologic questions that I simply cannot answer for him though. He was updated on findings of ED work-up.   Final Clinical Impressions(s) / ED Diagnoses   Final diagnoses:  Acute renal failure superimposed on chronic kidney disease, unspecified CKD stage, unspecified acute renal failure type [facility])  Urinary tract infection without hematuria, site unspecified  Delirium  Nephrostomy tube displaced [facility])    ED Discharge Orders    None       Virgel Manifold, MD 10/02/17 1317

## 2017-10-02 NOTE — ED Notes (Signed)
Dr Wilson Singer and admitting by the bedside at this time

## 2017-10-02 NOTE — ED Notes (Signed)
Husband to bedside

## 2017-10-02 NOTE — ED Notes (Signed)
Urologist at the bedside. Pt suctioned by this RN, pt reports feeling congested but is unable to spit up congestion

## 2017-10-02 NOTE — ED Notes (Addendum)
Dr Evangeline Gula given update on pt condition. Pt returned from IR. Per IR, doctor will be here for procedure in approx 30 mins

## 2017-10-02 NOTE — Consult Note (Signed)
Urology Consult Note   Requesting Attending Physician:  Sheehan, Theresa C, MD Service Providing Consult: Urology  Consulting Attending: Dr. Manny  Reason for Consult:  Hydronephrosis, AKI  HPI: [name] is seen in consultation for reasons noted above at the request of Sheehan, Theresa C, MD for evaluation of right left hydronephrosis and AKI.  This is a 62 y.o. female with:  1 - Bladder injury sustained during pelvic resection for advanced endometrial cancer 2016 / right ureteral stricture - S/p robotic RIGHT ureteral reimplant with psoas hitch + HM Flap 04/01/17 at time of colostomy take down / adhesiolysis, small bowel resection, loop iliostomy.Stent removed 08/17/17. FU CT 08/25/17 with resolved right hydro.Bilateral PCNs placed 09/02/17 for diversion away from rectovesical fistula, opposed to obstruction, see below.   2 - Metastatic Endometrial Cancer - S/p repeat resection 11/2014 with colon and vaginal involvement but negative nodes / margins. Had adjuvant chemo-XRT which she is now done with. Follows with Dr. Gorsuch.  3 - Acute on Chronic Renal Failure / Severe Dehydration- Cr 3.74 from ~1.5 baseline. Pt with high output ileostomy, difficult to maintain hydration, has been on PRN IVF.  Renal US 08/30/17 with mild caliectasis that is stable, and no right hydro at all. CT cystogram 09/15/17 with free bilateral reflux (also confirming no high grade GU obstruction), but with significant GI-GU fistula leading to systemic absorption of urine. CT abd/pelvis w/o contrast 10/02/17 reveals absence of the L PCN, stable left mild hydronephrosis, and a right kidney w/o hydro and a properly positioned R PCN.  4 - Bacteruria - Pt with bacteruria on admission. No fevers / leukocytosis. Most recent Ucx 09/02/17 - Serratia, treated with Cipro.   5 - Rectal - Bladder Fistula - Some drainage of yellowish thin fluid from peri-rectal area with standing per report new late 08/2017. CT cystogram 08/31/17 confims  NEW bladder neck - rectal fistula with large volume urine transit into GI tract distal to loop iliostomy. Output remained high even with foley drainage). Bilat nep tubes placed 09/02/17. Left nephrostomy tube became dislodged over the past week.  PMH sig for DM2, morbid obesity, breast cancer, ovarian cancer, endometrial cancer.    Past Medical History: Past Medical History:  Diagnosis Date  . Adrenal adenoma, left 02/08/2016   CT: stable benign  . Anemia in neoplastic disease   . Benign essential HTN   . 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 Cancer of corpus uteri, except isthmus (HCC)  oncologist-- dr rossi and dr gorsuch    10-15-2004  dx endometroid endometrial and ovarian cancer s/p  chemotheapy and surgery(TAH w/ BSO) :  recurrent 11-19-2014 post pelvic surgery and radiation 01-29-2015 to 03-10-2015  . Chronic idiopathic neutropenia (HCC)    presumed related to chemotherapy March 2006--- followed by dr gorsuch (treatment w/ G-CSF injections  . Chronic nausea   . Chronic pain    perineal/ anal  area from bladder pad irritates skin , right flank pain  . CKD stage G2/A3, GFR 60-89 and albumin creatinine ratio >300 mg/g    nephrologist-  dr elizabeth upton  . Diabetic retinopathy, background (HCC)   . Difficult intravenous access    small veins--- hx PICC lines  . DM type 2 (diabetes mellitus, type 2) (HCC)    monitored by dr michael altheimer  . Dysuria   .   Environmental and seasonal allergies   . Fatty liver 02/08/2016   CT  . Generalized muscle weakness   . GERD (gastroesophageal reflux disease)   . Hiatal hernia   . History of abdominal abscess 04/16/2017   post surgery 04-01-2017  --- resolved 10/ 2018  . History of gastric polyp    2014  duodenum  . History of ileus 04/16/2017    resolved w/ no surgical intervention  . History of radiation therapy    01-29-2015 to 03-10-2015  pelvis 50.4Gy  . Hypothyroidism    monitored by dr Legrand Como altheimer  . Ileostomy in place [facility]) 04/01/2017   created at same time colostomy takedown.  . Lower urinary tract symptoms (LUTS)    urge urinary  incontinence  . Mixed dyslipidemia   . Multiple thyroid nodules    Managed by Dr. Harlow Asa  . Pelvic abscess in female 04/16/2017  . PONV (postoperative nausea and vomiting)    "scopolamine patch works for me"  . Radiation-induced dermatitis    contact dermatitis , radiation completed, rash only on ankles now.  . Seasonal allergies   . Ureteral stricture, right UROLOGIT-  DR [facility]   CHRONIC--  TREATMENT URETERAL STENT  . 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 Vitamin D deficiency   . Wears glasses     Past Surgical History:  Past Surgical History:  Procedure Laterality Date  . APPENDECTOMY    . BREAST RECONSTRUCTION WITH PLACEMENT OF TISSUE EXPANDER AND FLEX HD (ACELLULAR HYDRATED DERMIS) Bilateral 12/11/2015   Procedure: BILATERAL BREAST RECONSTRUCTION WITH PLACEMENT OF TISSUE EXPANDERS;  Surgeon: Irene Limbo, MD;  Location: [place];  Service: Plastics;  Laterality: Bilateral;  . COLONOSCOPY WITH PROPOFOL N/A 08/21/2013   Procedure: COLONOSCOPY WITH PROPOFOL;  Surgeon: Cleotis Nipper, MD;  [facility];  Service: Endoscopy;  Laterality: N/A;  . COLOSTOMY TAKEDOWN N/A 12/04/2014   Procedure: LAPROSCOPIC LYSIS OF ADHESIONS, SPLENIC MOBILIZATION, RELOCATION OF COLOSTOMY, DEBRIDEMENT INITIAL COLOSTOMY SITE;  Surgeon: Michael [name], MD;  Location: [facility];  Service: General;  Laterality: N/A;  . CYSTOGRAM N/A 06/01/2017   Procedure: CYSTOGRAM;  Surgeon: Alexis Frock, MD;  [facility];  Service: Urology;  Laterality: N/A;  . CYSTOSCOPY W/ RETROGRADES Right 11/21/2015   Procedure: CYSTOSCOPY WITH RETROGRADE PYELOGRAM;  Surgeon: Alexis Frock, MD;  [facility];  Service: Urology;  Laterality: Right;  . CYSTOSCOPY W/ URETERAL STENT PLACEMENT Right 11/21/2015   Procedure: CYSTOSCOPY WITH STENT REPLACEMENT;  Surgeon: Alexis Frock, MD;  [facility];  Service: Urology;  Laterality: Right;  . CYSTOSCOPY W/ URETERAL STENT PLACEMENT Right 03/10/2016   Procedure: CYSTOSCOPY WITH STENT REPLACEMENT;  Surgeon: Alexis Frock, MD;  Location[facility];  Service: Urology;  Laterality: Right;  . CYSTOSCOPY W/ URETERAL STENT PLACEMENT Right 06/30/2016   Procedure: CYSTOSCOPY WITH RETROGRADE PYELOGRAM/URETERAL STENT EXCHANGE;  Surgeon: Alexis Frock, MD;  Location: [facility];  Service: Urology;  Laterality: Right;  . CYSTOSCOPY W/ URETERAL STENT PLACEMENT N/A 06/01/2017   Procedure: CYSTOSCOPY WITH EXAM UNDER ANESTHESIA;  Surgeon: Alexis Frock, MD;  [facility];  Service: Urology;  Laterality: N/A;  . CYSTOSCOPY W/ URETERAL STENT PLACEMENT Right 08/17/2017   Procedure: CYSTOSCOPY WITH RETROGRADE PYELOGRAM/URETERAL STENT REMOVAL;  Surgeon: Alexis Frock, MD;  Location: [facility];  Service: Urology;  Laterality: Right;  . CYSTOSCOPY WITH RETROGRADE PYELOGRAM, URETEROSCOPY AND STENT PLACEMENT Right 03/20/2015   Procedure: CYSTOSCOPY WITH RETROGRADE PYELOGRAM, URETEROSCOPY WITH BALLOON DILATION AND STENT PLACEMENT ON RIGHT;  Surgeon: Theodore Manny, MD;  Location: [facility];  Service: Urology;  Laterality: Right;  . CYSTOSCOPY WITH RETROGRADE PYELOGRAM, URETEROSCOPY AND STENT PLACEMENT Right 05/02/2015   Procedure: CYSTOSCOPY WITH RIGHT RETROGRADE PYELOGRAM,  DIAGNOSTIC URETEROSCOPY AND STENT PULL ;  Surgeon: Theodore Manny, MD;  Location: [facility];  Service: Urology;  Laterality: Right;  . CYSTOSCOPY WITH RETROGRADE PYELOGRAM, URETEROSCOPY AND STENT PLACEMENT Right 09/05/2015   Procedure: CYSTOSCOPY WITH RETROGRADE PYELOGRAM,  AND STENT PLACEMENT;  Surgeon: Theodore Manny, MD;   [facility];  Service: Urology;  Laterality: Right;  . CYSTOSCOPY WITH RETROGRADE PYELOGRAM, URETEROSCOPY AND STENT PLACEMENT Right 04/01/2017   Procedure: CYSTOSCOPY WITH RETROGRADE PYELOGRAM, URETEROSCOPY AND STENT PLACEMENT;  Surgeon: Manny, Theodore, MD;  [facility];  Service: Urology;  Laterality: Right;  . CYSTOSCOPY WITH STENT PLACEMENT Right 10/27/2016   Procedure: CYSTOSCOPY WITH STENT CHANGE and right retrograde pyelogram;  Surgeon: Theodore Manny, MD;  Location: [facility];  Service: Urology;  Laterality: Right;  . EUS N/A 10/02/2014   Procedure: LOWER ENDOSCOPIC ULTRASOUND (EUS);  Surgeon: William Outlaw, MD;  [facility];  Service: Endoscopy;  Laterality: N/A;  . EXCISION SOFT TISSUE MASS RIGHT FOREMAN  12-08-2006  . EYE SURGERY  as child   pytosis of eyelids repair  . INCISION AND DRAINAGE OF WOUND Bilateral 12/26/2015   Procedure: DEBRIDEMENT OF BILATERAL MASTECTOMY FLAPS;  Surgeon: Brinda Thimmappa, MD;  Location: [facility];  Service: Plastics;  Laterality: Bilateral;  . IR CV LINE INJECTION  05/31/2017  . IR FLUORO GUIDE CV LINE LEFT  05/31/2017  . IR FLUORO GUIDE CV LINE RIGHT  04/06/2017  . IR FLUORO GUIDE CV MIDLINE PICC RIGHT  05/30/2017  . IR NEPHROSTOGRAM LEFT INITIAL PLACEMENT  09/02/2017  . IR NEPHROSTOGRAM RIGHT INITIAL PLACEMENT  09/02/2017  . IR NEPHROSTOGRAM RIGHT THRU EXISTING ACCESS  09/13/2017  . IR RADIOLOGIST EVAL & MGMT  05/03/2017  . IR US GUIDE VASC ACCESS LEFT  05/31/2017  . IR US GUIDE VASC ACCESS RIGHT  04/06/2017  . IR US GUIDE VASC ACCESS RIGHT  05/30/2017  . LAPAROSCOPIC CHOLECYSTECTOMY  1990  . LIPOSUCTION WITH LIPOFILLING Bilateral 04/16/2016   Procedure: LIPOSUCTION WITH LIPOFILLING TO BILATERAL CHEST;  Surgeon: Brinda Thimmappa, MD;  Location: [facility];  Service: Plastics;  Laterality: Bilateral;  . MASTECTOMY W/ SENTINEL NODE BIOPSY Bilateral 12/11/2015   Procedure: RIGHT PROPHYLACTIC  MASTECTOMY, LEFT TOTAL MASTECTOMY WITH LEFT AXILLARY SENTINEL LYMPH NODE BIOPSY;  Surgeon: Faera Byerly, MD;  Location[facility];  Service: General;  Laterality: Bilateral;  . OSTOMY N/A 11/19/2014   Procedure: OSTOMY;  Surgeon: Steven Gross, MD;  [facility];  Service: General;  Laterality: N/A;  . PROCTOSCOPY N/A 04/01/2017   Procedure: RIDGE PROCTOSCOPY;  Surgeon: Gross, Steven, MD;  [facility];  Service: General;  Laterality: N/A;  . REMOVAL OF BILATERAL TISSUE EXPANDERS WITH PLACEMENT OF BILATERAL BREAST IMPLANTS Bilateral 04/16/2016   Procedure: REMOVAL OF BILATERAL TISSUE EXPANDERS WITH PLACEMENT OF BILATERAL BREAST IMPLANTS;  Surgeon: Brinda Thimmappa, MD;  Location: [facility];  Service: Plastics;  Laterality: Bilateral;  . ROBOTIC ASSISTED LAP VAGINAL HYSTERECTOMY N/A 11/19/2014   Procedure: ROBOTIC LYSIS OF ADHESIONS, CONVERTED TO LAPAROTOMY RADICAL UPPER VAGINECTOMY,LOW ANTERIOR BOWEL RESECTION, COLOSTOMY, BILATERAL URETERAL STENT PLACEMENT AND CYSTONOMY CLOSURE;  Surgeon: Emma Rossi, MD;  [facility];  Service: Gynecology;  Laterality: N/A;  . TISSUE EXPANDER FILLING Bilateral 12/26/2015   Procedure: EXPANSION OF BILATERAL CHEST   TISSUE EXPANDERS (60 mL- Right; 75 mL- Left);  Surgeon: Irene Limbo, MD;  Location: [place];  Service: Plastics;  Laterality: Bilateral;  . TONSILLECTOMY    . TOTAL ABDOMINAL HYSTERECTOMY  March 2006   [facility]   and Bilateral Salpingoophorectomy/  staging for Ovarian cancer/  an  . XI ROBOTIC ASSISTED LOWER ANTERIOR RESECTION N/A 04/01/2017   Procedure: XI ROBOTIC VS LAPAROSCOPIC COLOSTOMY TAKEDOWN WITH LYSIS OF ADHESIONS.;  Surgeon: Michael [name], MD;  [facility];  Service: General;  Laterality: N/A;  ERAS PATHWAY    Social History: Social History   Tobacco Use  . Smoking status: Never Smoker  . Smokeless tobacco: Never Used  Substance Use Topics  . Alcohol use: Yes    Comment: rare social  . Drug  use: No    Family History Family History  Problem Relation Age of Onset  . Cancer Mother 46       stomach ca  . Hypertension Mother   . Cancer Father 25       prostate ca  . Diabetes Father   . Heart disease Father        CABG  . Breast cancer Maternal Aunt        dx in her 23s  . Lymphoma Paternal Aunt   . Brain cancer Paternal Grandfather   . Ovarian cancer Other   . Diabetes Sister   . Hypertension Brother y-10  . Heart disease Brother        CABG  . Diabetes Brother     Review of Systems 10 systems were reviewed and are negative except as noted specifically in the HPI.  Objective  BP (!) 164/77   Pulse (!) 127   Temp 99.3 F (37.4 C) (Rectal)   Resp 17   SpO2 96%   Physical Exam General: A&O, resting, appropriate HEENT: [place]/AT, EOMI, MMM Pulmonary: Normal work of breathing Cardiovascular: HDS, adequate peripheral perfusion Abdomen: Soft, NTTP, nondistended. GU: R PCN in place draining clear yellow urine. L PCN recently dislodged. Foley in place with yellow urine. Extremities: warm and well perfused Neuro: Appropriate, no focal neurological deficits  Most Recent Labs: Lab Results  Component Value Date   WBC 2.9 (L) 10/02/2017   HGB 9.9 (L) 10/02/2017   HCT 32.2 (L) 10/02/2017   PLT 387 10/02/2017    Lab Results  Component Value Date   NA 136 10/02/2017   K 4.0 10/02/2017   CL 109 10/02/2017   CO2 14 (L) 10/02/2017   BUN 52 (H) 10/02/2017   CREATININE 3.74 (H) 10/02/2017   CALCIUM 9.2 10/02/2017   MG 1.9 09/21/2017   PHOS 4.3 09/16/2017     IMAGING: Ct Abdomen Pelvis Wo Contrast: 10/02/2017 IMPRESSION: 1. Since the prior CT, the left posterior pelvic pigtail catheter has been removed. This catheter had withdrawn into the gluteus maximus from the presacral collection seen on the CT dated 09/15/2017. 2. The presacral fluid collection, where the pigtail catheter had originally been placed, is no longer discretely visualized, although there is still  soft tissue attenuation in the presacral space. The residual soft tissue attenuation may be postsurgical scarring/change. No definite residual or recurrent abscess. 3. There are new lower lobe airspace opacities at the lung bases, which may all be atelectasis. Pneumonia should be suspected if there are consistent clinical findings. 4. Left nephrostomy tube has been removed since the prior CT. Left kidney appears swollen with at least mild hydronephrosis. The proximal left ureter is mildly distended.  The distal ureter is not well-defined. 5. No change in the appearance of the right kidney with stable positioning of the right nephrostomy tube. 6. Changes from bowel surgery and formation of the right mid to upper quadrant colostomy are stable. Electronically Signed   By: Lajean Manes M.D.   On: 10/02/2017 10:13   -----  Assessment/Plan:  62 y.o. female with:  1 - Bladder Injury / Right Ureteral Stricture -imaging post-repair favorable, suggesting successful repair.Her mild left caliectasis is chronic and stable.  2 - Metastatic Endometrial Cancer -no evidence of active disease, per med-oncology.   3 - Acute on Chronic Renal Failure / Severe Dehydration- Nephrology on board, rise in BUN and Cr likely pre-renal + exacerbation / artifact from systemic reabsorption via contact with colon via fistula. Left-sided caliectasis is chronic, and likely not indicative of obstruction.   4- Bacteruria - Expected given chronic Foley, though clinical significance uncertain. Would received Levo, Vanc, Aztreonam in the ED. Follow cultures and clinical course.   5 - Rectal - Bladder Fistula - Left PCN will need replacement to divert urine away from her rectovesical fistula. Continue right PCN and Foley to drain, as this making her hygeine and care managable.   Long term, she will likely need permanent GI and GU diversion when nutritionally replete, this will be months away.     Thank you for this consult.  Please contact the urology consult pager with any further questions/concerns.

## 2017-10-02 NOTE — ED Notes (Signed)
Per main lab, 1223 order for lactic acid presents as cancelled. Main lab to run order for 1429 lactic. Will speak with Dr Wilson Singer regarding labs.

## 2017-10-02 NOTE — ED Notes (Addendum)
Dr Evangeline Gula aware of pt HR. Pt O2 increased from 2L [place] to 4L [place] after dilaudid administration. Pt O2 sat decreased to 80-83% while resting. O2 increased back to 94% temporarily and then decreased back to approx 89%. Dr Evangeline Gula notified. This RN placed pt on basic O2 mask as pt is a mouth breather when sleeping. O2 increased to 94% on 4L

## 2017-10-02 NOTE — Progress Notes (Addendum)
Referring Physician(s): Kohut,S  Supervising Physician: Sandi Mariscal  Patient Status:  [facility] - In-pt  Chief Complaint:  Displaced left nephrostomy  Subjective: Patient familiar to IR service from prior PICC line placements as well as pelvic drain placement on 04/16/17.  She has a history of breast cancer as well as recurrent endometrial cancer with prior low anterior resection, partial vaginectomy, and colostomy in April 2016.  She underwent colostomy takedown with loop ileostomy diversion in August 2018.  She has also had robotic right ureteral reimplant/double-J stent with psoas bladder hitch in 2018 with stent removal on 08/17/17.  She is currently leaking fluid from her rectum despite Foley decompression of the bladder.  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  There was reflux of contrast into the right kidney via the right ureter with moderate atrophy of the right kidney and nonobstructive calculi in the lower pole of the left kidney.  She is status post bilateral percutaneous nephrostomies on 09/02/17 as well as presacral abscess drain on 09/16/17, since removed.  She presents now with displacement/inadvertent removal of left nephrostomy tube and request received for replacement.  Patient appears much more lethargic and confused than during previous admission.  Husband at bedside.  Past Medical History:  Diagnosis Date  . Adrenal adenoma, left 02/08/2016   CT: stable benign  . Anemia in neoplastic disease   . Benign essential HTN   . 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 Cancer of corpus uteri, except isthmus [facility])  oncologist-- dr Denman [name] and dr Alvy Bimler     10-15-2004  dx endometroid endometrial and ovarian cancer s/p  chemotheapy and surgery(TAH w/ BSO) :  recurrent 11-19-2014 post pelvic surgery and radiation 01-29-2015 to 03-10-2015  . Chronic idiopathic neutropenia (HCC)    presumed related to chemotherapy March 2006--- followed by dr Alvy Bimler (treatment w/ G-CSF injections  . Chronic nausea   . Chronic pain    perineal/ anal  area from bladder pad irritates skin , right flank pain  . CKD stage G2/A3, GFR 60-89 and albumin creatinine ratio >300 mg/g    nephrologist-  dr Madelon Lips  . Diabetic retinopathy, background ([place])   . Difficult intravenous access    small veins--- hx PICC lines  . DM type 2 (diabetes mellitus, type 2) ([place])    monitored by dr Legrand Como altheimer  . Dysuria   . Environmental and seasonal allergies   . Fatty liver 02/08/2016   CT  . Generalized muscle weakness   . GERD (gastroesophageal reflux disease)   . Hiatal hernia   . History of abdominal abscess 04/16/2017   post surgery 04-01-2017  --- resolved 10/ 2018  . History of gastric polyp    2014  duodenum  . History of ileus 04/16/2017   resolved w/ no surgical intervention  . History of radiation therapy    01-29-2015 to 03-10-2015  pelvis 50.4Gy  . Hypothyroidism    monitored by dr Legrand Como altheimer  . Ileostomy in place [facility]) 04/01/2017   created at same time colostomy takedown.  . Lower urinary tract symptoms (LUTS)    urge urinary  incontinence  . Mixed dyslipidemia   . Multiple thyroid nodules    Managed  by Dr. Harlow Asa  . Pelvic abscess in female 04/16/2017  . PONV (postoperative nausea and vomiting)    "scopolamine patch works for me"  . Radiation-induced dermatitis    contact dermatitis , radiation completed, rash only on ankles now.  . Seasonal allergies   . Ureteral stricture, right UROLOGIT-  DR [name]   CHRONIC--  TREATMENT URETERAL STENT  . 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 Vitamin D deficiency   . Wears glasses     Past Surgical History:  Procedure Laterality Date  . APPENDECTOMY    . BREAST RECONSTRUCTION WITH PLACEMENT OF TISSUE EXPANDER AND FLEX HD (ACELLULAR HYDRATED DERMIS) Bilateral 12/11/2015   Procedure: BILATERAL BREAST RECONSTRUCTION WITH PLACEMENT OF TISSUE EXPANDERS;  Surgeon: Irene Limbo, MD;  Location: [place];  Service: Plastics;  Laterality: Bilateral;  . COLONOSCOPY WITH PROPOFOL N/A 08/21/2013   Procedure: COLONOSCOPY WITH PROPOFOL;  Surgeon: Cleotis Nipper, MD;  [facility];  Service: Endoscopy;  Laterality: N/A;  . COLOSTOMY TAKEDOWN N/A 12/04/2014   Procedure: LAPROSCOPIC LYSIS OF ADHESIONS, SPLENIC MOBILIZATION, RELOCATION OF COLOSTOMY, DEBRIDEMENT INITIAL COLOSTOMY SITE;  Surgeon: Michael [name], MD;  Location: [facility];  Service: General;  Laterality: N/A;  . CYSTOGRAM N/A 06/01/2017   Procedure: CYSTOGRAM;  Surgeon: Alexis Frock, MD;  [facility];  Service: Urology;  Laterality: N/A;  . CYSTOSCOPY W/ RETROGRADES Right 11/21/2015   Procedure: CYSTOSCOPY WITH RETROGRADE PYELOGRAM;  Surgeon: Alexis Frock, MD;  [facility];  Service: Urology;  Laterality: Right;  . CYSTOSCOPY W/ URETERAL STENT PLACEMENT Right 11/21/2015   Procedure: CYSTOSCOPY WITH STENT REPLACEMENT;  Surgeon: Alexis Frock, MD;  [facility];  Service: Urology;  Laterality: Right;  . CYSTOSCOPY W/ URETERAL STENT PLACEMENT Right 03/10/2016   Procedure: CYSTOSCOPY WITH STENT REPLACEMENT;  Surgeon: Alexis Frock, MD;  Location: [facility];  Service: Urology;  Laterality: Right;  . CYSTOSCOPY W/ URETERAL STENT PLACEMENT Right 06/30/2016   Procedure: CYSTOSCOPY WITH RETROGRADE PYELOGRAM/URETERAL STENT EXCHANGE;  Surgeon: Alexis Frock, MD;  Location: [facility];  Service: Urology;  Laterality: Right;  . CYSTOSCOPY W/ URETERAL STENT PLACEMENT N/A 06/01/2017   Procedure: CYSTOSCOPY WITH EXAM UNDER ANESTHESIA;  Surgeon: Alexis Frock, MD;  [facility];   Service: Urology;  Laterality: N/A;  . CYSTOSCOPY W/ URETERAL STENT PLACEMENT Right 08/17/2017   Procedure: CYSTOSCOPY WITH RETROGRADE PYELOGRAM/URETERAL STENT REMOVAL;  Surgeon: Alexis Frock, MD;  Location: [facility];  Service: Urology;  Laterality: Right;  . CYSTOSCOPY WITH RETROGRADE PYELOGRAM, URETEROSCOPY AND STENT PLACEMENT Right 03/20/2015   Procedure: CYSTOSCOPY WITH RETROGRADE PYELOGRAM, URETEROSCOPY WITH BALLOON DILATION AND STENT PLACEMENT ON RIGHT;  Surgeon: Alexis Frock, MD;  Location: [facility];  Service: Urology;  Laterality: Right;  . CYSTOSCOPY WITH RETROGRADE PYELOGRAM, URETEROSCOPY AND STENT PLACEMENT Right 05/02/2015   Procedure: CYSTOSCOPY WITH RIGHT RETROGRADE PYELOGRAM,  DIAGNOSTIC URETEROSCOPY AND STENT PULL ;  Surgeon: Alexis Frock, MD;  Location: [facility];  Service: Urology;  Laterality: Right;  . CYSTOSCOPY WITH RETROGRADE PYELOGRAM, URETEROSCOPY AND STENT PLACEMENT Right 09/05/2015   Procedure: CYSTOSCOPY WITH RETROGRADE PYELOGRAM,  AND STENT PLACEMENT;  Surgeon: Alexis Frock, MD;  [facility];  Service: Urology;  Laterality: Right;  . CYSTOSCOPY WITH RETROGRADE PYELOGRAM, URETEROSCOPY AND STENT PLACEMENT Right 04/01/2017   Procedure: CYSTOSCOPY WITH RETROGRADE PYELOGRAM, URETEROSCOPY AND STENT PLACEMENT;  Surgeon: Alexis Frock, MD;  [facility];  Service: Urology;  Laterality: Right;  . CYSTOSCOPY WITH STENT PLACEMENT Right 10/27/2016  Procedure: CYSTOSCOPY WITH STENT CHANGE and right retrograde pyelogram;  Surgeon: Alexis Frock, MD;  Location: [facility];  Service: Urology;  Laterality: Right;  . EUS N/A 10/02/2014   Procedure: LOWER ENDOSCOPIC ULTRASOUND (EUS);  Surgeon: Arta Silence, MD;  Location: [facility];  Service: Endoscopy;  Laterality: N/A;  . EXCISION SOFT TISSUE MASS RIGHT FOREMAN  12-08-2006  . EYE SURGERY  as child   pytosis of eyelids repair  . INCISION AND DRAINAGE  OF WOUND Bilateral 12/26/2015   Procedure: DEBRIDEMENT OF BILATERAL MASTECTOMY FLAPS;  Surgeon: Irene Limbo, MD;  Location: [place];  Service: Plastics;  Laterality: Bilateral;  . IR CV LINE INJECTION  05/31/2017  . IR FLUORO GUIDE CV LINE LEFT  05/31/2017  . IR FLUORO GUIDE CV LINE RIGHT  04/06/2017  . IR FLUORO GUIDE CV MIDLINE PICC RIGHT  05/30/2017  . IR NEPHROSTOGRAM LEFT INITIAL PLACEMENT  09/02/2017  . IR NEPHROSTOGRAM RIGHT INITIAL PLACEMENT  09/02/2017  . IR NEPHROSTOGRAM RIGHT THRU EXISTING ACCESS  09/13/2017  . IR RADIOLOGIST EVAL & MGMT  05/03/2017  . IR US GUIDE VASC ACCESS LEFT  05/31/2017  . IR US GUIDE VASC ACCESS RIGHT  04/06/2017  . IR US GUIDE VASC ACCESS RIGHT  05/30/2017  . LAPAROSCOPIC CHOLECYSTECTOMY  1990  . LIPOSUCTION WITH LIPOFILLING Bilateral 04/16/2016   Procedure: LIPOSUCTION WITH LIPOFILLING TO BILATERAL CHEST;  Surgeon: Irene Limbo, MD;  Location: [place];  Service: Plastics;  Laterality: Bilateral;  . MASTECTOMY W/ SENTINEL NODE BIOPSY Bilateral 12/11/2015   Procedure: RIGHT PROPHYLACTIC MASTECTOMY, LEFT TOTAL MASTECTOMY WITH LEFT AXILLARY SENTINEL LYMPH NODE BIOPSY;  Surgeon: Stark Klein, MD;  Location: [place];  Service: General;  Laterality: Bilateral;  . OSTOMY N/A 11/19/2014   Procedure: OSTOMY;  Surgeon: Michael [name], MD;  [facility];  Service: General;  Laterality: N/A;  . PROCTOSCOPY N/A 04/01/2017   Procedure: RIDGE PROCTOSCOPY;  Surgeon: Michael [name], MD;  [facility];  Service: General;  Laterality: N/A;  . REMOVAL OF BILATERAL TISSUE EXPANDERS WITH PLACEMENT OF BILATERAL BREAST IMPLANTS Bilateral 04/16/2016   Procedure: REMOVAL OF BILATERAL TISSUE EXPANDERS WITH PLACEMENT OF BILATERAL BREAST IMPLANTS;  Surgeon: Irene Limbo, MD;  Location: [place];  Service: Plastics;  Laterality: Bilateral;  . ROBOTIC ASSISTED LAP VAGINAL HYSTERECTOMY N/A 11/19/2014   Procedure: ROBOTIC LYSIS OF  ADHESIONS, CONVERTED TO LAPAROTOMY RADICAL UPPER VAGINECTOMY,LOW ANTERIOR BOWEL RESECTION, COLOSTOMY, BILATERAL URETERAL STENT PLACEMENT AND CYSTONOMY CLOSURE;  Surgeon: Everitt Amber, MD;  [facility];  Service: Gynecology;  Laterality: N/A;  . TISSUE EXPANDER FILLING Bilateral 12/26/2015   Procedure: EXPANSION OF BILATERAL CHEST TISSUE EXPANDERS (60 mL- Right; 75 mL- Left);  Surgeon: Irene Limbo, MD;  Location: [place];  Service: Plastics;  Laterality: Bilateral;  . TONSILLECTOMY    . TOTAL ABDOMINAL HYSTERECTOMY  March 2006   [facility]   and Bilateral Salpingoophorectomy/  staging for Ovarian cancer/  an  . XI ROBOTIC ASSISTED LOWER ANTERIOR RESECTION N/A 04/01/2017   Procedure: XI ROBOTIC VS LAPAROSCOPIC COLOSTOMY TAKEDOWN WITH LYSIS OF ADHESIONS.;  Surgeon: Michael [name], MD;  [facility];  Service: General;  Laterality: N/A;  ERAS PATHWAY      Allergies: Penicillins; Adhesive [tape]; Cefaclor; Erythromycin; Trimethoprim; Ultram [tramadol]; Ciprofloxacin; Fluconazole; Oxycodone; Pectin; and Sulfa antibiotics  Medications: Prior to Admission medications   Medication Sig Start Date End Date Taking? Authorizing Provider  acetaminophen (TYLENOL) 325 MG tablet Take 2 tablets (650 mg total) by mouth  every 6 (six) hours as needed. 09/24/17   Roxan Hockey, MD  ALPRAZolam Duanne Moron) 0.5 MG tablet Take 1 tablet (0.5 mg total) by mouth 3 (three) times daily as needed for anxiety. 09/24/17   Roxan Hockey, MD  anastrozole (ARIMIDEX) 1 MG tablet Take 1 tablet (1 mg total) by mouth daily. 09/24/17   Roxan Hockey, MD  artificial tears (LACRILUBE) OINT ophthalmic ointment Place into both eyes at bedtime. 09/24/17   Roxan Hockey, MD  Biotin 5 MG TABS Take 5 mg by mouth every morning.     [provider]  Cholecalciferol (VITAMIN D3) 10000 UNITS capsule Take 10,000 Units by mouth once a week. Sunday evening's    [provider]  ciprofloxacin (CIPRO) 500  MG tablet Take 1 tablet (500 mg total) by mouth 2 (two) times daily. 09/24/17   Roxan Hockey, MD  diphenhydrAMINE (BENADRYL) 25 mg capsule Take 1 capsule (25 mg total) by mouth every 8 (eight) hours as needed for itching, allergies or sleep. 09/24/17   Roxan Hockey, MD  diphenoxylate-atropine (LOMOTIL) 2.5-0.025 MG tablet Take 1 tablet by mouth 4 (four) times daily as needed for diarrhea or loose stools. TO PREVENT LOOSE BOWEL MOVEMENTS 09/24/17   Roxan Hockey, MD  DULoxetine (CYMBALTA) 30 MG capsule Take 1 capsule (30 mg total) by mouth 2 (two) times daily. 09/24/17   Roxan Hockey, MD  famotidine-calcium carbonate-magnesium hydroxide (PEPCID COMPLETE) 10-800-165 MG chewable tablet Chew 1 tablet by mouth daily as needed (INDIGESTION). 09/24/17   Roxan Hockey, MD  fentaNYL (DURAGESIC - DOSED MCG/HR) 75 MCG/HR Place 1 patch (75 mcg total) onto the skin every 3 (three) days. 09/25/17   Roxan Hockey, MD  ferrous sulfate 325 (65 FE) MG tablet Take 1 tablet (325 mg total) by mouth at bedtime. 09/24/17   Roxan Hockey, MD  filgrastim (NEUPOGEN) 480 MCG/1.6ML injection Inject 480 mcg/1.6 ml under the skin every 6 days for life (last dose 09/22/17) 09/24/17   Emokpae, Courage, MD  Hydrocortisone (GERHARDT'S BUTT CREAM) CREA Apply 1 application topically 4 (four) times daily. 09/24/17   Roxan Hockey, MD  HYDROmorphone (DILAUDID) 4 MG tablet Take 1.5 tablets (6 mg total) by mouth every 3 (three) hours as needed for moderate pain or severe pain. 09/24/17   Roxan Hockey, MD  insulin aspart (NOVOLOG) 100 UNIT/ML injection Inject 0-20 Units into the skin 3 (three) times daily with meals. 09/24/17   Roxan Hockey, MD  insulin aspart (NOVOLOG) 100 UNIT/ML injection Inject 7 Units into the skin 3 (three) times daily with meals. 09/24/17   Roxan Hockey, MD  insulin glargine (LANTUS) 100 UNIT/ML injection Inject 0.25 mLs (25 Units total) into the skin 2 (two) times daily. 09/24/17   Roxan Hockey, MD  levothyroxine (SYNTHROID, LEVOTHROID) 150 MCG tablet Take 1 tablet (150 mcg total) by mouth daily before breakfast. 09/25/17   Emokpae, Courage, MD  lidocaine (LIDODERM) 5 % Place 1 patch onto the skin daily. Remove & Discard patch within 12 hours 09/25/17   Roxan Hockey, MD  lip balm (CARMEX) ointment Apply 1 application topically 2 (two) times daily. 09/24/17   Roxan Hockey, MD  loperamide (IMODIUM) 2 MG capsule Take 1-2 capsules (2-4 mg total) by mouth every 8 (eight) hours as needed for diarrhea or loose stools (Use if >2 BM every 8 hours). 09/24/17   Roxan Hockey, MD  loratadine (CLARITIN) 10 MG tablet Take 10 mg by mouth every morning.     [provider]  methocarbamol (ROBAXIN) 750 MG tablet Take  1 tablet (750 mg total) by mouth 3 (three) times daily. 09/24/17   Roxan Hockey, MD  omega-3 acid ethyl esters (LOVAZA) 1 G capsule Take 1 g by mouth 2 (two) times daily.     [provider]  ondansetron (ZOFRAN-ODT) 4 MG disintegrating tablet Take 4 mg by mouth every 6 (six) hours as needed for nausea or vomiting.  04/12/17   [provider]  Polyethyl Glycol-Propyl Glycol (SYSTANE OP) Place 1 drop into both eyes at bedtime.     [provider]  polyvinyl alcohol (LIQUIFILM TEARS) 1.4 % ophthalmic solution Place 1 drop into both eyes at bedtime. 09/24/17   Roxan Hockey, MD  pregabalin (LYRICA) 75 MG capsule Take 1 capsule (75 mg total) by mouth 2 (two) times daily. 09/24/17   Roxan Hockey, MD  Prenatal Vit-Fe Fumarate-FA (PRENATAL VITAMIN PO) Take 1 capsule by mouth daily. Takes prenatal because there are no dyes in it    [provider]  prochlorperazine (COMPAZINE) 10 MG tablet Take 1 tablet (10 mg total) by mouth 2 (two) times daily as needed for nausea or vomiting. Patient taking differently: Take 10 mg by mouth 2 (two) times daily as needed for nausea or vomiting.  05/28/17   Virgel Manifold, MD  promethazine (PHENERGAN) 25  MG tablet Take 1 tablet (25 mg total) by mouth every 6 (six) hours as needed for nausea or vomiting. 05/28/17   Virgel Manifold, MD  protein supplement shake (PREMIER PROTEIN) LIQD Take 325 mLs (11 oz total) by mouth 2 (two) times daily as needed (Pt to request). 09/24/17   Roxan Hockey, MD  psyllium (HYDROCIL/METAMUCIL) 95 % PACK Take 1 packet by mouth 2 (two) times daily. 09/24/17   Roxan Hockey, MD  ranitidine (ZANTAC) 150 MG tablet Take 1 tablet (150 mg total) by mouth 2 (two) times daily as needed for heartburn. 09/24/17   Roxan Hockey, MD  rosuvastatin (CRESTOR) 10 MG tablet Take 10 mg by mouth every evening.     [provider]  sertraline (ZOLOFT) 50 MG tablet Take 1.5 tablets (75 mg total) by mouth daily. 09/24/17   Roxan Hockey, MD  sitaGLIPtin (JANUVIA) 100 MG tablet Take 100 mg by mouth every morning.     [provider]  traZODone (DESYREL) 100 MG tablet Take 1 tablet (100 mg total) by mouth at bedtime as needed for sleep. 09/24/17   Roxan Hockey, MD     Vital Signs: BP 127/70   Pulse (!) 107   Temp 99.3 F (37.4 C) (Rectal)   Resp 20   SpO2 97%   Physical Exam patient has eyes open but  is lethargic.  Will mumble a few responses to questions.  Chest with distant breath sounds bilaterally.  Heart with tachycardic but regular rhythm.  Abdomen soft, intact ostomy, right nephrostomy tube intact, left nephrostomy out  Imaging: Ct Abdomen Pelvis Wo Contrast  Result Date: 10/02/2017 CLINICAL DATA:  Pt has sepsis Had recent enterococcus wound infection and also serratia UTI EXAM: CT ABDOMEN AND PELVIS WITHOUT CONTRAST TECHNIQUE: Multidetector CT imaging of the abdomen and pelvis was performed following the standard protocol without IV contrast. COMPARISON:  09/22/2017 FINDINGS: Lower chest: There is patchy opacity in the lower lobes at the lung bases, right greater than left. This is new since the prior CT. This may all be atelectasis. Pneumonia should be  considered if there are consistent clinical findings. No pulmonary edema. No pleural effusion. Hepatobiliary: Normal liver. Gallbladder surgically absent. Mild prominence of common  bile duct with normal distal tapering. Pancreas: No pancreatic mass or inflammation. Mild pancreatic atrophy. Spleen: Normal in size without focal abnormality. Adrenals/Urinary Tract: 17 mm left adrenal mass, stable from the prior CT. Left kidney is swollen with a poorly defined collecting system that appears at least mildly dilated. There is a nonobstructing stone in the lower pole. Right ureter is mildly dilated. It is not well visualized distally. No convincing ureteral stone. A nephrostomy catheter lies within the right kidney. No significant right hydronephrosis. Right ureter is not well followed distally. No convincing ureteral stone. No renal masses. Bladder is decompressed with a Foley catheter. Nephrostomy tube on the left has been removed since the prior CT. Stomach/Bowel: No bowel obstruction or convincing inflammation. Right mid abdomen colostomy. Stomach is unremarkable. Vascular/Lymphatic: There are prominent retroperitoneal lymph nodes, none pathologically enlarged, all stable from the prior CT. No vascular abnormality. Reproductive: Uterus is surgically absent.  No discrete pelvic mass. Other: The presacral collection noted on the prior CT dated 09/15/2017, is no longer discretely seen. There is still soft tissue attenuation in this location adjacent to surgical vascular clips. The left posterior pelvic pigtail catheter has been removed since the prior exam. No significant ascites. Musculoskeletal: No fracture or acute finding. No osteoblastic or osteolytic lesions. IMPRESSION: 1. Since the prior CT, the left posterior pelvic pigtail catheter has been removed. This catheter had withdrawn into the gluteus maximus from the presacral collection seen on the CT dated 09/15/2017. 2. The presacral fluid collection, where the  pigtail catheter had originally been placed, is no longer discretely visualized, although there is still soft tissue attenuation in the presacral space. The residual soft tissue attenuation may be postsurgical scarring/change. No definite residual or recurrent abscess. 3. There are new lower lobe airspace opacities at the lung bases, which may all be atelectasis. Pneumonia should be suspected if there are consistent clinical findings. 4. Left nephrostomy tube has been removed since the prior CT. Left kidney appears swollen with at least mild hydronephrosis. The proximal left ureter is mildly distended. The distal ureter is not well-defined. 5. No change in the appearance of the right kidney with stable positioning of the right nephrostomy tube. 6. Changes from bowel surgery and formation of the right mid to upper quadrant colostomy are stable. Electronically Signed   By: Lajean Manes M.D.   On: 10/02/2017 10:13   Ct Head Wo Contrast  Result Date: 10/02/2017 CLINICAL DATA:  Altered mental status.  Nonverbal. EXAM: CT HEAD WITHOUT CONTRAST TECHNIQUE: Contiguous axial images were obtained from the base of the skull through the vertex without intravenous contrast. COMPARISON:  None. FINDINGS: Brain: No evidence of acute infarction, hemorrhage, hydrocephalus, extra-axial collection or mass lesion/mass effect. Vascular: No hyperdense vessel or unexpected calcification. Skull: Normal. Negative for fracture or focal lesion. Sinuses/Orbits: Globes and orbits are unremarkable. There is a small amount of dependent fluid in the right sphenoid sinus. Sinuses otherwise clear. Clear mastoid air cells. Other: None. IMPRESSION: No intracranial abnormality. Electronically Signed   By: Lajean Manes M.D.   On: 10/02/2017 12:04   Dg Chest Portable 1 View  Result Date: 10/02/2017 CLINICAL DATA:  Lethargy, dyspnea EXAM: PORTABLE CHEST 1 VIEW COMPARISON:  08/31/2017 FINDINGS: Lungs are clear.  No pleural effusion or pneumothorax.  The heart is normal in size. IMPRESSION: No evidence of acute cardiopulmonary disease. Electronically Signed   By: Julian Hy M.D.   On: 10/02/2017 08:12    Labs:  CBC: Recent Labs    09/20/17  9147 09/21/17 0411 09/23/17 0339 10/02/17 0729  WBC 14.4* 11.8* 13.1* 2.9*  HGB 9.1* 8.5* 8.9* 9.9*  HCT 29.2* 27.8* 28.9* 32.2*  PLT 287 264 303 387    COAGS: Recent Labs    04/16/17 0047 09/02/17 0939  INR 1.21 1.21    BMP: Recent Labs    09/20/17 0511 09/21/17 0411 09/22/17 0420 09/23/17 0339 10/02/17 0729  NA 136 139  --  137 136  K 4.0 4.1  --  3.6 4.0  CL 109 112*  --  108 109  CO2 19* 19*  --  17* 14*  GLUCOSE 171* 138*  --  125* 123*  BUN 35* 31*  --  30* 52*  CALCIUM 8.7* 9.0  --  8.8* 9.2  CREATININE 1.88* 2.03* 1.97* 1.85* 3.74*  GFRNONAA 27* 25* 26* 28* 12*  GFRAA 32* 29* 30* 32* 14*    LIVER FUNCTION TESTS: Recent Labs    08/29/17 1550  09/09/17 0457 09/15/17 0345 09/18/17 0416 10/02/17 0729  BILITOT 0.5  --   --  0.3 0.2* 0.4  AST 16  --   --  _0 ALT 24  --   --  _1 ALKPHOS 173*  --   --  131* 160* 248*  PROT 6.8  --   --  6.5 6.9 7.7  ALBUMIN 2.1*   < > 1.8* 1.9* 2.0* 1.9*   < > = values in this interval not displayed.    Assessment and Plan: Ptwith hx met endom ca, post op bladder injury/ leak (rectal bladder fistula), s/p diverting bilat PCN's 2/1;s/p presacral abscess drain 2/15; presacral abscess drain removed on 2/21; now with displacement of left nephrostomy tube- request received for replacement.  Current labs include WBC 2.9, hemoglobin 9.9, platelets 387k, creatinine 3.74; PT/INR pending.Case reviewed by Dr. Pascal Lux.  Details/risks of procedure, including but not limited to, internal bleeding, infection, injury to adjacent structures, discussed with patient's husband his understanding and consent.  Procedure scheduled for today.  Will hold lovenox until after above procedure.  With current lethargic state and increased  oxygen requirements would minimize use of Versed.     Electronically Signed: D. Rowe Robert, PA-C 10/02/2017, 2:51 PM   I spent a total of 20 minutes at the the patient's bedside AND on the patient's hospital floor or unit, greater than 50% of which was counseling/coordinating care for left nephrostomy replacement/possible new placement    Patient ID: [name], female   DOB: 1956-02-01, 62 y.o.   MRN: [id number]

## 2017-10-02 NOTE — ED Notes (Signed)
Pt transported to IR 

## 2017-10-02 NOTE — Consult Note (Signed)
Reason for Consult:AKI, CKD Referring Physician: Dr. Roma Kayser Spidle is an 62 y.o. female.  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  TAH/BSo 2006 with Radrx, Carboplat/taxol chemo.  Recurrence in 2016 with resx, post op RADrx. 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  Had colostomy take down 8/18, with ileostomy placement and pouch.  Developed colovesical fistula and urinoma.  Attempt to reimplant ureter and R stent placed.  Repeated abdm abscesses after that surgery. . Had foley placed .  R stent removed 08/17/17. Subsequently had bilat nephrostomies placed 2/29.   L removed about 2 wk  Ago and is draining urine out site.  Cr baseline was .8-1, rose to 4.24 late Jan , was felt vol and obstruction.  After vol and Nx, Cr fell to 1.8-2.  Over past few days, ^ lethargy, decreased ability to interact.  Cr now 3.74.  Bicarbonate 14.  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 Review of systems not obtained due to patient factors.   . Primary Nephrologist Hollie Salk. .   Past Medical History:  Diagnosis Date  . Adrenal adenoma, left 02/08/2016   CT: stable benign  . Anemia in neoplastic disease   . Benign essential HTN   . 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 Cancer of corpus uteri, except isthmus [facility])  oncologist-- dr Denman [name] and dr Alvy Bimler    10-15-2004  dx endometroid endometrial and ovarian cancer s/p  chemotheapy and surgery(TAH w/ BSO) :  recurrent 11-19-2014 post pelvic surgery and radiation 01-29-2015 to 03-10-2015  . Chronic idiopathic neutropenia  (HCC)    presumed related to chemotherapy March 2006--- followed by dr Alvy Bimler (treatment w/ G-CSF injections  . Chronic nausea   . Chronic pain    perineal/ anal  area from bladder pad irritates skin , right flank pain  . CKD stage G2/A3, GFR 60-89 and albumin creatinine ratio >300 mg/g    nephrologist-  dr Madelon Lips  . Diabetic retinopathy, background ([place])   . Difficult intravenous access    small veins--- hx PICC lines  . DM type 2 (diabetes mellitus, type 2) ([place])    monitored by dr Legrand Como altheimer  . Dysuria   . Environmental and seasonal allergies   . Fatty liver 02/08/2016   CT  . Generalized muscle weakness   . GERD (gastroesophageal reflux disease)   . Hiatal hernia   . History of abdominal abscess 04/16/2017   post surgery 04-01-2017  --- resolved 10/ 2018  . History of gastric polyp    2014  duodenum  . History of ileus 04/16/2017   resolved w/ no surgical intervention  . History of radiation therapy    01-29-2015 to 03-10-2015  pelvis 50.4Gy  . Hypothyroidism    monitored by dr Legrand Como altheimer  . Ileostomy in place [facility]) 04/01/2017   created at same time colostomy takedown.  . Lower urinary tract symptoms (LUTS)    urge urinary  incontinence  . Mixed dyslipidemia   . Multiple thyroid nodules    Managed by Dr. Harlow Asa  . Pelvic abscess in female  04/16/2017  . PONV (postoperative nausea and vomiting)    "scopolamine patch works for me"  . Radiation-induced dermatitis    contact dermatitis , radiation completed, rash only on ankles now.  . Seasonal allergies   . Ureteral stricture, right UROLOGIT-  DR [facility]   CHRONIC--  TREATMENT URETERAL STENT  . 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 Vitamin D deficiency   . Wears glasses     Past Surgical History:  Procedure Laterality Date  . APPENDECTOMY    . BREAST RECONSTRUCTION WITH PLACEMENT OF TISSUE EXPANDER AND FLEX HD (ACELLULAR HYDRATED DERMIS) Bilateral 12/11/2015   Procedure: BILATERAL  BREAST RECONSTRUCTION WITH PLACEMENT OF TISSUE EXPANDERS;  Surgeon: Irene Limbo, MD;  Location: [place];  Service: Plastics;  Laterality: Bilateral;  . COLONOSCOPY WITH PROPOFOL N/A 08/21/2013   Procedure: COLONOSCOPY WITH PROPOFOL;  Surgeon: Cleotis Nipper, MD;  [facility];  Service: Endoscopy;  Laterality: N/A;  . COLOSTOMY TAKEDOWN N/A 12/04/2014   Procedure: LAPROSCOPIC LYSIS OF ADHESIONS, SPLENIC MOBILIZATION, RELOCATION OF COLOSTOMY, DEBRIDEMENT INITIAL COLOSTOMY SITE;  Surgeon: Michael [name], MD;  [facility];  Service: General;  Laterality: N/A;  . CYSTOGRAM N/A 06/01/2017   Procedure: CYSTOGRAM;  Surgeon: Alexis Frock, MD;  [facility];  Service: Urology;  Laterality: N/A;  . CYSTOSCOPY W/ RETROGRADES Right 11/21/2015   Procedure: CYSTOSCOPY WITH RETROGRADE PYELOGRAM;  Surgeon: Alexis Frock, MD;  [facility];  Service: Urology;  Laterality: Right;  . CYSTOSCOPY W/ URETERAL STENT PLACEMENT Right 11/21/2015   Procedure: CYSTOSCOPY WITH STENT REPLACEMENT;  Surgeon: Alexis Frock, MD;  [facility];  Service: Urology;  Laterality: Right;  . CYSTOSCOPY W/ URETERAL STENT PLACEMENT Right 03/10/2016   Procedure: CYSTOSCOPY WITH STENT REPLACEMENT;  Surgeon: Alexis Frock, MD;  Location: [facility];  Service: Urology;  Laterality: Right;  . CYSTOSCOPY W/ URETERAL STENT PLACEMENT Right 06/30/2016   Procedure: CYSTOSCOPY WITH RETROGRADE PYELOGRAM/URETERAL STENT EXCHANGE;  Surgeon: Alexis Frock, MD;  Location: [facility];  Service: Urology;  Laterality: Right;  . CYSTOSCOPY W/ URETERAL STENT PLACEMENT N/A 06/01/2017   Procedure: CYSTOSCOPY WITH EXAM UNDER ANESTHESIA;  Surgeon: Alexis Frock, MD;  [facility];  Service: Urology;  Laterality: N/A;  . CYSTOSCOPY W/ URETERAL STENT PLACEMENT Right 08/17/2017   Procedure: CYSTOSCOPY WITH RETROGRADE PYELOGRAM/URETERAL STENT REMOVAL;  Surgeon: Alexis Frock, MD;  Location: [facility];  Service: Urology;  Laterality: Right;  . CYSTOSCOPY WITH RETROGRADE PYELOGRAM, URETEROSCOPY AND STENT PLACEMENT Right 03/20/2015   Procedure: CYSTOSCOPY WITH RETROGRADE PYELOGRAM, URETEROSCOPY WITH BALLOON DILATION AND STENT PLACEMENT ON RIGHT;  Surgeon: Alexis Frock, MD;  Location: [facility];  Service: Urology;  Laterality: Right;  . CYSTOSCOPY WITH RETROGRADE PYELOGRAM, URETEROSCOPY AND STENT PLACEMENT Right 05/02/2015   Procedure: CYSTOSCOPY WITH RIGHT RETROGRADE PYELOGRAM,  DIAGNOSTIC URETEROSCOPY AND STENT PULL ;  Surgeon: Alexis Frock, MD;  Location: [facility];  Service: Urology;  Laterality: Right;  . CYSTOSCOPY WITH RETROGRADE PYELOGRAM, URETEROSCOPY AND STENT PLACEMENT Right 09/05/2015   Procedure: CYSTOSCOPY WITH RETROGRADE PYELOGRAM,  AND STENT PLACEMENT;  Surgeon: Alexis Frock, MD;  [facility];  Service: Urology;  Laterality: Right;  . CYSTOSCOPY WITH RETROGRADE PYELOGRAM, URETEROSCOPY AND STENT PLACEMENT Right 04/01/2017   Procedure: CYSTOSCOPY WITH RETROGRADE PYELOGRAM, URETEROSCOPY AND STENT PLACEMENT;  Surgeon: Alexis Frock, MD;  [facility];  Service: Urology;  Laterality: Right;  . CYSTOSCOPY WITH STENT PLACEMENT Right 10/27/2016   Procedure: CYSTOSCOPY WITH STENT CHANGE and right retrograde  pyelogram;  Surgeon: Alexis Frock, MD;  Location: [facility];  Service: Urology;  Laterality: Right;  . EUS N/A 10/02/2014   Procedure: LOWER ENDOSCOPIC ULTRASOUND (EUS);  Surgeon: Arta Silence, MD;  Location: [facility];  Service: Endoscopy;  Laterality: N/A;  . EXCISION SOFT TISSUE MASS RIGHT FOREMAN  12-08-2006  . EYE SURGERY  as child   pytosis of eyelids repair  . INCISION AND DRAINAGE OF WOUND Bilateral 12/26/2015   Procedure: DEBRIDEMENT OF BILATERAL MASTECTOMY FLAPS;  Surgeon: Irene Limbo, MD;  Location: [place];  Service: Plastics;  Laterality: Bilateral;  . IR CV LINE INJECTION   05/31/2017  . IR FLUORO GUIDE CV LINE LEFT  05/31/2017  . IR FLUORO GUIDE CV LINE RIGHT  04/06/2017  . IR FLUORO GUIDE CV MIDLINE PICC RIGHT  05/30/2017  . IR NEPHROSTOGRAM LEFT INITIAL PLACEMENT  09/02/2017  . IR NEPHROSTOGRAM RIGHT INITIAL PLACEMENT  09/02/2017  . IR NEPHROSTOGRAM RIGHT THRU EXISTING ACCESS  09/13/2017  . IR RADIOLOGIST EVAL & MGMT  05/03/2017  . IR US GUIDE VASC ACCESS LEFT  05/31/2017  . IR US GUIDE VASC ACCESS RIGHT  04/06/2017  . IR US GUIDE VASC ACCESS RIGHT  05/30/2017  . LAPAROSCOPIC CHOLECYSTECTOMY  1990  . LIPOSUCTION WITH LIPOFILLING Bilateral 04/16/2016   Procedure: LIPOSUCTION WITH LIPOFILLING TO BILATERAL CHEST;  Surgeon: Irene Limbo, MD;  Location: [place];  Service: Plastics;  Laterality: Bilateral;  . MASTECTOMY W/ SENTINEL NODE BIOPSY Bilateral 12/11/2015   Procedure: RIGHT PROPHYLACTIC MASTECTOMY, LEFT TOTAL MASTECTOMY WITH LEFT AXILLARY SENTINEL LYMPH NODE BIOPSY;  Surgeon: Stark Klein, MD;  Location: [place];  Service: General;  Laterality: Bilateral;  . OSTOMY N/A 11/19/2014   Procedure: OSTOMY;  Surgeon: Michael [name], MD;  [facility];  Service: General;  Laterality: N/A;  . PROCTOSCOPY N/A 04/01/2017   Procedure: RIDGE PROCTOSCOPY;  Surgeon: Michael [name], MD;  [facility];  Service: General;  Laterality: N/A;  . REMOVAL OF BILATERAL TISSUE EXPANDERS WITH PLACEMENT OF BILATERAL BREAST IMPLANTS Bilateral 04/16/2016   Procedure: REMOVAL OF BILATERAL TISSUE EXPANDERS WITH PLACEMENT OF BILATERAL BREAST IMPLANTS;  Surgeon: Irene Limbo, MD;  Location: [place];  Service: Plastics;  Laterality: Bilateral;  . ROBOTIC ASSISTED LAP VAGINAL HYSTERECTOMY N/A 11/19/2014   Procedure: ROBOTIC LYSIS OF ADHESIONS, CONVERTED TO LAPAROTOMY RADICAL UPPER VAGINECTOMY,LOW ANTERIOR BOWEL RESECTION, COLOSTOMY, BILATERAL URETERAL STENT PLACEMENT AND CYSTONOMY CLOSURE;  Surgeon: Everitt Amber, MD;  [facility];  Service: Gynecology;   Laterality: N/A;  . TISSUE EXPANDER FILLING Bilateral 12/26/2015   Procedure: EXPANSION OF BILATERAL CHEST TISSUE EXPANDERS (60 mL- Right; 75 mL- Left);  Surgeon: Irene Limbo, MD;  Location: [place];  Service: Plastics;  Laterality: Bilateral;  . TONSILLECTOMY    . TOTAL ABDOMINAL HYSTERECTOMY  March 2006   [facility]   and Bilateral Salpingoophorectomy/  staging for Ovarian cancer/  an  . XI ROBOTIC ASSISTED LOWER ANTERIOR RESECTION N/A 04/01/2017   Procedure: XI ROBOTIC VS LAPAROSCOPIC COLOSTOMY TAKEDOWN WITH LYSIS OF ADHESIONS.;  Surgeon: Michael [name], MD;  [facility];  Service: General;  Laterality: N/A;  ERAS PATHWAY    Family History  Problem Relation Age of Onset  . Cancer Mother 18       stomach ca  . Hypertension Mother   . Cancer Father 3       prostate ca  . Diabetes Father   . Heart disease Father        CABG  .  Breast cancer Maternal Aunt        dx in her 1s  . Lymphoma Paternal Aunt   . Brain cancer Paternal Grandfather   . Ovarian cancer Other   . Diabetes Sister   . Hypertension Brother y-10  . Heart disease Brother        CABG  . Diabetes Brother     Social History:  reports that  has never smoked. she has never used smokeless tobacco. She reports that she drinks alcohol. She reports that she does not use drugs.  Allergies:  Allergies  Allergen Reactions  . Penicillins Swelling    Facial swelling/childhood allergy Has patient had a PCN reaction causing immediate rash, facial/tongue/throat swelling, SOB or lightheadedness with hypotension: Yes Has patient had a PCN reaction causing severe rash involving mucus membranes or skin necrosis: Yes Has patient had a PCN reaction that required hospitalization yes Has patient had a PCN reaction occurring within the last 10 years: No If all of the above answers are "NO", then may proceed with Cephalosporin use.   . Adhesive [Tape]     blisters  . Cefaclor Rash    Ceclor  . Erythromycin  Other (See Comments)    Gastritis, abd cramps  . Trimethoprim Rash  . Ultram [Tramadol] Hives  . Ciprofloxacin Other (See Comments)    Unknown; On Dr notes  During 08/2017 admission, patient tolerated 7 day course of IV Cipro.  . Fluconazole Rash  . Oxycodone Other (See Comments)    " I just feel weird"  . Pectin Rash    Pectin ring for stoma  . Sulfa Antibiotics Rash    Medications: I have reviewed the patient's current medications. Prior to Admission:  (Not in a hospital admission)  Results for orders placed or performed during the hospital encounter of 10/02/17 (from the past 48 hour(s))  CBG monitoring, ED     Status: Abnormal   Collection Time: 10/02/17  6:53 AM  Result Value Ref Range   Glucose-Capillary 100 (H) 65 - 99 mg/dL  Comprehensive metabolic panel     Status: Abnormal   Collection Time: 10/02/17  7:29 AM  Result Value Ref Range   Sodium 136 135 - 145 mmol/L   Potassium 4.0 3.5 - 5.1 mmol/L   Chloride 109 101 - 111 mmol/L   CO2 14 (L) 22 - 32 mmol/L   Glucose, Bld 123 (H) 65 - 99 mg/dL   BUN 52 (H) 6 - 20 mg/dL   Creatinine, Ser 3.74 (H) 0.44 - 1.00 mg/dL   Calcium 9.2 8.9 - 10.3 mg/dL   Total Protein 7.7 6.5 - 8.1 g/dL   Albumin 1.9 (L) 3.5 - 5.0 g/dL   AST 15 15 - 41 U/L   ALT 20 14 - 54 U/L   Alkaline Phosphatase 248 (H) 38 - 126 U/L   Total Bilirubin 0.4 0.3 - 1.2 mg/dL   GFR calc non Af Amer 12 (L) >60 mL/min   GFR calc Af Amer 14 (L) >60 mL/min    Comment: (NOTE) The eGFR has been calculated using the CKD EPI equation. This calculation has not been validated in all clinical situations. eGFR's persistently <60 mL/min signify possible Chronic Kidney Disease.    Anion gap 13 5 - 15    Comment: Performed at [facility] [address], [place], [place] [address]  CBC with Differential     Status: Abnormal   Collection Time: 10/02/17  7:29 AM  Result Value Ref Range  WBC 2.9 (L) 4.0 - 10.5 K/uL   RBC 3.40 (L) 3.87 - 5.11 MIL/uL    Hemoglobin 9.9 (L) 12.0 - 15.0 g/dL   HCT 32.2 (L) 36.0 - 46.0 %   MCV 94.7 78.0 - 100.0 fL   MCH 29.1 26.0 - 34.0 pg   MCHC 30.7 30.0 - 36.0 g/dL   RDW 16.9 (H) 11.5 - 15.5 %   Platelets 387 150 - 400 K/uL   Neutrophils Relative % 53 %   Neutro Abs 1.5 (L) 1.7 - 7.7 K/uL   Lymphocytes Relative 23 %   Lymphs Abs 0.7 0.7 - 4.0 K/uL   Monocytes Relative 19 %   Monocytes Absolute 0.6 0.1 - 1.0 K/uL   Eosinophils Relative 4 %   Eosinophils Absolute 0.1 0.0 - 0.7 K/uL   Basophils Relative 1 %   Basophils Absolute 0.0 0.0 - 0.1 K/uL    Comment: Performed at [facility] [address], [place], [place] [address]  Urinalysis, Routine w reflex microscopic     Status: Abnormal   Collection Time: 10/02/17  8:36 AM  Result Value Ref Range   Color, Urine YELLOW YELLOW   APPearance CLOUDY (A) CLEAR   Specific Gravity, Urine 1.025 1.005 - 1.030   pH 5.5 5.0 - 8.0   Glucose, UA NEGATIVE NEGATIVE mg/dL   Hgb urine dipstick MODERATE (A) NEGATIVE   Bilirubin Urine NEGATIVE NEGATIVE   Ketones, ur 15 (A) NEGATIVE mg/dL   Protein, ur >300 (A) NEGATIVE mg/dL   Nitrite NEGATIVE NEGATIVE   Leukocytes, UA LARGE (A) NEGATIVE    Comment: Performed at [facility] [address] [place], [place] [address]  Urinalysis, Microscopic (reflex)     Status: Abnormal   Collection Time: 10/02/17  8:36 AM  Result Value Ref Range   RBC / HPF 6-30 0 - 5 RBC/hpf   WBC, UA TOO NUMEROUS TO COUNT 0 - 5 WBC/hpf   Bacteria, UA MANY (A) NONE SEEN   Squamous Epithelial / LPF 0-5 (A) NONE SEEN    Comment: Performed at [facility], [place] [address], [place], [place] [address]  Lactic acid, plasma     Status: None   Collection Time: 10/02/17 12:31 PM  Result Value Ref Range   Lactic Acid, Venous 0.9 0.5 - 1.9 mmol/L    Comment: Performed at [facility] [address], [place], [place] [address]    Ct Abdomen Pelvis Wo Contrast  Result Date: 10/02/2017 CLINICAL DATA:  Pt has sepsis Had  recent enterococcus wound infection and also serratia UTI EXAM: CT ABDOMEN AND PELVIS WITHOUT CONTRAST TECHNIQUE: Multidetector CT imaging of the abdomen and pelvis was performed following the standard protocol without IV contrast. COMPARISON:  09/22/2017 FINDINGS: Lower chest: There is patchy opacity in the lower lobes at the lung bases, right greater than left. This is new since the prior CT. This may all be atelectasis. Pneumonia should be considered if there are consistent clinical findings. No pulmonary edema. No pleural effusion. Hepatobiliary: Normal liver. Gallbladder surgically absent. Mild prominence of common bile duct with normal distal tapering. Pancreas: No pancreatic mass or inflammation. Mild pancreatic atrophy. Spleen: Normal in size without focal abnormality. Adrenals/Urinary Tract: 17 mm left adrenal mass, stable from the prior CT. Left kidney is swollen with a poorly defined collecting system that appears at least mildly dilated. There is a nonobstructing stone in the lower pole. Right ureter is mildly dilated. It is not well visualized  distally. No convincing ureteral stone. A nephrostomy catheter lies within the right kidney. No significant right hydronephrosis. Right ureter is not well followed distally. No convincing ureteral stone. No renal masses. Bladder is decompressed with a Foley catheter. Nephrostomy tube on the left has been removed since the prior CT. Stomach/Bowel: No bowel obstruction or convincing inflammation. Right mid abdomen colostomy. Stomach is unremarkable. Vascular/Lymphatic: There are prominent retroperitoneal lymph nodes, none pathologically enlarged, all stable from the prior CT. No vascular abnormality. Reproductive: Uterus is surgically absent.  No discrete pelvic mass. Other: The presacral collection noted on the prior CT dated 09/15/2017, is no longer discretely seen. There is still soft tissue attenuation in this location adjacent to surgical vascular clips. The  left posterior pelvic pigtail catheter has been removed since the prior exam. No significant ascites. Musculoskeletal: No fracture or acute finding. No osteoblastic or osteolytic lesions. IMPRESSION: 1. Since the prior CT, the left posterior pelvic pigtail catheter has been removed. This catheter had withdrawn into the gluteus maximus from the presacral collection seen on the CT dated 09/15/2017. 2. The presacral fluid collection, where the pigtail catheter had originally been placed, is no longer discretely visualized, although there is still soft tissue attenuation in the presacral space. The residual soft tissue attenuation may be postsurgical scarring/change. No definite residual or recurrent abscess. 3. There are new lower lobe airspace opacities at the lung bases, which may all be atelectasis. Pneumonia should be suspected if there are consistent clinical findings. 4. Left nephrostomy tube has been removed since the prior CT. Left kidney appears swollen with at least mild hydronephrosis. The proximal left ureter is mildly distended. The distal ureter is not well-defined. 5. No change in the appearance of the right kidney with stable positioning of the right nephrostomy tube. 6. Changes from bowel surgery and formation of the right mid to upper quadrant colostomy are stable. Electronically Signed   By: Lajean Manes M.D.   On: 10/02/2017 10:13   Ct Head Wo Contrast  Result Date: 10/02/2017 CLINICAL DATA:  Altered mental status.  Nonverbal. EXAM: CT HEAD WITHOUT CONTRAST TECHNIQUE: Contiguous axial images were obtained from the base of the skull through the vertex without intravenous contrast. COMPARISON:  None. FINDINGS: Brain: No evidence of acute infarction, hemorrhage, hydrocephalus, extra-axial collection or mass lesion/mass effect. Vascular: No hyperdense vessel or unexpected calcification. Skull: Normal. Negative for fracture or focal lesion. Sinuses/Orbits: Globes and orbits are unremarkable. There is  a small amount of dependent fluid in the right sphenoid sinus. Sinuses otherwise clear. Clear mastoid air cells. Other: None. IMPRESSION: No intracranial abnormality. Electronically Signed   By: Lajean Manes M.D.   On: 10/02/2017 12:04   Dg Chest Portable 1 View  Result Date: 10/02/2017 CLINICAL DATA:  Lethargy, dyspnea EXAM: PORTABLE CHEST 1 VIEW COMPARISON:  08/31/2017 FINDINGS: Lungs are clear.  No pleural effusion or pneumothorax. The heart is normal in size. IMPRESSION: No evidence of acute cardiopulmonary disease. Electronically Signed   By: Julian Hy M.D.   On: 10/02/2017 08:12    ROS Blood pressure 127/70, pulse (!) 111, temperature 99.3 F (37.4 C), temperature source Rectal, resp. rate (!) 21, SpO2 97 %. Physical Exam Physical Examination: General appearance - obese, pale, mouth breathing, not coop.  Mental status - eyes open , obeys few commands Eyes - DM retinal dz Mouth - film of dried mucous in pharynx Neck - adenopathy noted PCL Lymphatics - posterior cervical nodes Chest - scattered rhonchi, rapid resp Heart - S1 and  S2 normal, systolic murmur Gr 2/6 at apex Abdomen - obese, pos bs, ostomy R mid abdm,  Nx R flank, hole in L flank with urine, skin irritated Extremities - peripheral pulses normal, no pedal edema, no clubbing or cyanosis Skin - pale, irritated at L Nx site, . Back side not eval  Assessment/Plan: 1 AKI/CKD CKD from obstructive uropathy and acute injuries. Clearly has tubular defect also as well as bicarb loss in stool. AKI infx, dehyd, obstruction all play role.  Need to replace L Nx, do nephostogram on R.  Has been Overdose of Cipro and could have injury from that also. Needs BSAB, cultures, TX with isotonic bicarb,  2 Obstructive uropathy as above 3 Vesicoureteral fistula 4. Hydronephrotic swollen kidney on L Needs Nx and drain 5. DM 6 AMS infx,and ^ Cr 7 obesity 8 Ca uterus 9 ostomy losing bicarb P iv isotonic bicarb, Cultures, AB, New L Nx,  Nephrostogram on R.  Limit psychoactive meds  M[place]icia Area 10/02/2017, 3:19 PM

## 2017-10-02 NOTE — ED Notes (Signed)
Pt transported to IR, per IR staff pt will be transported to inpatient room after IR. Shirlean Mylar, RN aware

## 2017-10-02 NOTE — ED Notes (Signed)
Pt placed on 2L [place] to prevent pt O2 sat from decreasing after dilaudid administration

## 2017-10-02 NOTE — ED Notes (Signed)
Patient transported to CT 

## 2017-10-02 NOTE — ED Notes (Signed)
This RN spoke with Dr Evangeline Gula and patient placement. This RN feels that based on pt condition, pt is more appropriate for SDU. Dr. Evangeline Gula agreed and order chaged. Patient placement updated.

## 2017-10-02 NOTE — Procedures (Signed)
Pre Procedure Dx: Hydronephrosis Post Procedure Dx: Same  Successful left sided PCN replacement Successful right sided PCN exchange and upsizing to 12 Fr.  EBL: None   No immediate complications.   Ronny Bacon, MD Pager #: [phone]

## 2017-10-02 NOTE — ED Triage Notes (Signed)
Pt here via [facility] with c/o lethargic x "a few days" per EMS. Pt from [facility]. EMS reports husband feels like pt is going to make a full recovery from cancer. Pt is on high doses of pain medication. Pt alert x 4 but lethargic. Pt answers questions, but whispers. Pt is in extreme pain "all over". Pt nephrostomy tube to left side completely out of left kidney with purulent drainage to back and dressing-md removed dressing. Multiple decub to sacral areas, some have dressings, some do not. Pt has foley cath in place, with cloudy urine noted. Pt also has right nephrostomy tube.  Md at bedside.

## 2017-10-02 NOTE — ED Notes (Addendum)
Pt placed on nonrebreather. O2 sat increased to 96%. RT at bedside. Dr Evangeline Gula notified

## 2017-10-03 ENCOUNTER — Inpatient Hospital Stay (HOSPITAL_COMMUNITY): Payer: BLUE CROSS/BLUE SHIELD

## 2017-10-03 DIAGNOSIS — I878 Other specified disorders of veins: Secondary | ICD-10-CM

## 2017-10-03 DIAGNOSIS — T83022A Displacement of nephrostomy catheter, initial encounter: Secondary | ICD-10-CM

## 2017-10-03 DIAGNOSIS — N1330 Unspecified hydronephrosis: Secondary | ICD-10-CM

## 2017-10-03 DIAGNOSIS — Z515 Encounter for palliative care: Secondary | ICD-10-CM

## 2017-10-03 DIAGNOSIS — T83593A Infection and inflammatory reaction due to other urinary stents, initial encounter: Secondary | ICD-10-CM

## 2017-10-03 DIAGNOSIS — S31819A Unspecified open wound of right buttock, initial encounter: Secondary | ICD-10-CM

## 2017-10-03 LAB — RENAL FUNCTION PANEL
Albumin: 1.5 g/dL — ABNORMAL LOW (ref 3.5–5.0)
Anion gap: 13 (ref 5–15)
BUN: 52 mg/dL — AB (ref 6–20)
CO2: 16 mmol/L — ABNORMAL LOW (ref 22–32)
Calcium: 8.5 mg/dL — ABNORMAL LOW (ref 8.9–10.3)
Chloride: 112 mmol/L — ABNORMAL HIGH (ref 101–111)
Creatinine, Ser: 3.27 mg/dL — ABNORMAL HIGH (ref 0.44–1.00)
GFR, EST AFRICAN AMERICAN: 16 mL/min — AB (ref >60)
GFR, EST NON AFRICAN AMERICAN: 14 mL/min — AB (ref >60)
GLUCOSE: 155 mg/dL — AB (ref 65–99)
POTASSIUM: 3.6 mmol/L (ref 3.5–5.1)
Phosphorus: 5.3 mg/dL — ABNORMAL HIGH (ref 2.5–4.6)
Sodium: 141 mmol/L (ref 135–145)

## 2017-10-03 LAB — CBC
HCT: 27.2 % — ABNORMAL LOW (ref 36.0–46.0)
Hemoglobin: 8.3 g/dL — ABNORMAL LOW (ref 12.0–15.0)
MCH: 28.2 pg (ref 26.0–34.0)
MCHC: 30.5 g/dL (ref 30.0–36.0)
MCV: 92.5 fL (ref 78.0–100.0)
Platelets: 332 10*3/uL (ref 150–400)
RBC: 2.94 MIL/uL — AB (ref 3.87–5.11)
RDW: 16.8 % — ABNORMAL HIGH (ref 11.5–15.5)
WBC: 4.5 10*3/uL (ref 4.0–10.5)

## 2017-10-03 LAB — BLOOD GAS, ARTERIAL
Acid-base deficit: 9.3 mmol/L — ABNORMAL HIGH (ref 0.0–2.0)
Bicarbonate: 15.5 mmol/L — ABNORMAL LOW (ref 20.0–28.0)
Drawn by: 52076
FIO2: 21
O2 Saturation: 95.8 %
PATIENT TEMPERATURE: 98.6
PH ART: 7.324 — AB (ref 7.350–7.450)
PO2 ART: 75.2 mmHg — AB (ref 83.0–108.0)
pCO2 arterial: 30.7 mmHg — ABNORMAL LOW (ref 32.0–48.0)

## 2017-10-03 LAB — GLUCOSE, CAPILLARY
GLUCOSE-CAPILLARY: 94 mg/dL (ref 65–99)
Glucose-Capillary: 119 mg/dL — ABNORMAL HIGH (ref 65–99)
Glucose-Capillary: 93 mg/dL (ref 65–99)

## 2017-10-03 LAB — CK: Total CK: 18 U/L — ABNORMAL LOW (ref 38–234)

## 2017-10-03 MED ORDER — FAMOTIDINE 20 MG PO TABS: 20.0000 mg | ORAL_TABLET | Freq: Every day | ORAL | Status: DC

## 2017-10-03 MED ORDER — WHITE PETROLATUM EX OINT
TOPICAL_OINTMENT | CUTANEOUS | Status: AC
  Administered 2017-10-02: 04:00:00
  Filled 2017-10-03: qty 28.35

## 2017-10-03 MED ORDER — ADULT MULTIVITAMIN W/MINERALS CH
1.0000 | ORAL_TABLET | Freq: Every day | ORAL | Status: DC
  Administered 2017-10-04 – 2017-10-13 (×10): 1 via ORAL
  Filled 2017-10-03 (×10): qty 1

## 2017-10-03 MED ORDER — COLLAGENASE 250 UNIT/GM EX OINT
TOPICAL_OINTMENT | Freq: Every day | CUTANEOUS | Status: DC
  Administered 2017-10-03 – 2017-10-13 (×11): via TOPICAL
  Filled 2017-10-03: qty 30

## 2017-10-03 MED ORDER — FENTANYL 25 MCG/HR TD PT72
25.0000 ug | MEDICATED_PATCH | TRANSDERMAL | Status: DC
  Administered 2017-10-03 – 2017-10-12 (×4): 25 ug via TRANSDERMAL
  Filled 2017-10-03 (×4): qty 1

## 2017-10-03 MED ORDER — MAGNESIUM SULFATE 2 GM/50ML IV SOLN
2.0000 g | Freq: Every day | INTRAVENOUS | Status: AC
  Administered 2017-10-03 – 2017-10-05 (×3): 2 g via INTRAVENOUS
  Filled 2017-10-03 (×3): qty 50

## 2017-10-03 MED ORDER — ENOXAPARIN SODIUM 30 MG/0.3ML ~~LOC~~ SOLN
30.0000 mg | SUBCUTANEOUS | Status: DC
  Administered 2017-10-03 – 2017-10-06 (×4): 30 mg via SUBCUTANEOUS
  Filled 2017-10-03 (×4): qty 0.3

## 2017-10-03 MED ORDER — PRO-STAT SUGAR FREE PO LIQD
30.0000 mL | Freq: Three times a day (TID) | ORAL | Status: DC
  Administered 2017-10-03 – 2017-10-04 (×2): 30 mL via ORAL
  Filled 2017-10-03 (×2): qty 30

## 2017-10-03 MED ORDER — FENTANYL CITRATE (PF) 100 MCG/2ML IJ SOLN
25.0000 ug | INTRAMUSCULAR | Status: DC | PRN
  Administered 2017-10-04: 50 ug via INTRAVENOUS
  Administered 2017-10-05 (×2): 25 ug via INTRAVENOUS
  Filled 2017-10-03 (×3): qty 2

## 2017-10-03 MED ORDER — PRENATAL PLUS 27-1 MG PO TABS
1.0000 | ORAL_TABLET | Freq: Every day | ORAL | Status: DC
  Filled 2017-10-03: qty 1

## 2017-10-03 NOTE — Progress Notes (Signed)
    [facility] for Infectious Disease   Reason for visit: Follow up on stent infection  Interval History: no new cultures, afebrile; No meropenem yet due to no access; rapid response overnight   Physical Exam: Constitutional:  Vitals:   10/03/17 0600 10/03/17 0800  BP: (!) 117/49 (!) 116/50  Pulse: 92 92  Resp: 14 14  Temp:  98.4 F (36.9 C)  SpO2: 94% 95%   patient appears in NAD, ill-appearing Eyes: anicteric HENT: no thrush Respiratory: Normal respiratory effort; CTA B Cardiovascular: RRR GI: soft, some tenderness with palpation diffusely but soft, no rebound or guarding  Review of Systems: Unable to be assessed due to mental status  Lab Results  Component Value Date   WBC 4.5 10/03/2017   HGB 8.3 (L) 10/03/2017   HCT 27.2 (L) 10/03/2017   MCV 92.5 10/03/2017   PLT 332 10/03/2017    Lab Results  Component Value Date   CREATININE 3.27 (H) 10/03/2017   BUN 52 (H) 10/03/2017   NA 141 10/03/2017   K 3.6 10/03/2017   CL 112 (H) 10/03/2017   CO2 16 (L) 10/03/2017    Lab Results  Component Value Date   ALT 20 10/02/2017   AST 15 10/02/2017   ALKPHOS 248 (H) 10/02/2017     Microbiology: Recent Results (from the past 240 hour(s))  MRSA PCR Screening     Status: Abnormal   Collection Time: 10/02/17  6:54 PM  Result Value Ref Range Status   MRSA by PCR POSITIVE (A) NEGATIVE Final    Comment:        The GeneXpert MRSA Assay (FDA approved for NASAL specimens only), is one component of a comprehensive MRSA colonization surveillance program. It is not intended to diagnose MRSA infection nor to guide or monitor treatment for MRSA infections. RESULT CALLED TO, READ BACK BY AND VERIFIED WITH: TDECAREAUX,RN @2050  10/02/17 BY LHOWARD Performed at [facility], [place] [address], [place], [place] [address]     Impression/Plan:  1. UTI - with nephrostomy tubes.  Had been on cipro.  Pus in tubes.  I would most consider this as the source.  She needs the  meropenem as resistance with recent cipro very possible.  Start when able.  2.  Access - needs access for treatment of above.    3.  Hydronephrosis - PCN placed and changed by IR.

## 2017-10-03 NOTE — Consult Note (Addendum)
[place] Nurse wound consult note Reason for Consult: pressure injuries Patient known to [facility] nurse team from her previous admissions. She is very lethargic and only cries out in pain when we are in the room. She is not able to converse with me at all.  Palliative care in the room during my assessment Wound type: Sacrum: Unstageable Pressure injury 3 different sites; largest central wound with small skin bridges between.  Area is aprox 10cm x 12cm  Left buttock: 0.3cm x 0.3cm tunneled area, brother reports that patient did have a drain in this site?  I was not able to look at the right hip today, she was in a lot of pain and the medical team has arrived to place central line  Pressure Injury POA: Yes Measurement:see above  Wound bed: sacrum: the affected areas over her sacrum are mostly covered with grey/black non viable tissue, the area that extends onto the right buttock has some epithelial buds but overall the wound is mostly necrotic  Tunneled area on the left buttock, unable to visualize wound base   Drainage (amount, consistency, odor) minimal but malodorous, minimal from the buttock site Periwound: intact  Dressing procedure/placement/frequency: Add low air loss mattress for moisture management and pressure redistribution, it should be noted that patient has refused in the past and today the husband is aware that she is not really able to refuse at this point (she is too lethargic to answer when I ask her).  Feel that because she is so altered and not moving that she would benefit from a trial of the bed. We can certainly place her back on regular bed if she improves and finds the bed uncomfortable Enzymatic debridement agent for the sacral wounds. She could benefit from hydrotherapy however at this time I do not think she can tolerate in current status.  Prevalon boots for her vunerable heels, the admission nurse noted that she has boggy red heels but today they appear ok with blanchable  redness. WOC will follow up in the morning to attempt to look at her right hip     WOC Nurse ostomy consult note Stoma type/location: RLQ ileostomy Complex ostomy pouching regimen that has been managed successfully by her husband.  Output green/brown, liquid Ostomy pouching: 1pc.flexible convex in place for 4 days per husband  Mr. Golphin has a very extensive process for changing his wife's pouch.  I will outline the steps in her medical record.  I will plan to change pouch with him first thing in the morning.   [place], [place], [place]

## 2017-10-03 NOTE — Progress Notes (Signed)
OT Cancellation Note  Patient Details Name: [name] MRN: [id number] DOB: 03-15-1956   Cancelled Treatment:    Reason Eval/Treat Not Completed: Patient at procedure or test/ unavailable. Will attempt to return later.   [place], OT/L  [phone] 10/03/2017 10/03/2017, 10:46 AM

## 2017-10-03 NOTE — Evaluation (Signed)
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 Pt dc'd from [facility] on 2/24 after > 1 month hospitalization. Pt readmitted to [facility] on 10/02/2017 with pain, delirium and acute renal failure and found to have displacement of lt nephrostomy tube and exchange of rt nephrostomy tube on 3/3.   Clinical Impression  Pt presents to PT with decr mobility due to weakness and lethargy. Expect pt will make steady progress as medical condition improves. Recommend pt return to SNF for further rehab at [place].     Follow Up Recommendations SNF    Equipment Recommendations  None recommended by PT    Recommendations for Other Services       Precautions / Restrictions Precautions Precautions: Fall;Other (comment) Precaution Comments: bilateral nephrostomy tubes, colostomy Restrictions Weight Bearing Restrictions: No      Mobility  Bed Mobility Overal bed mobility: Needs Assistance Bed Mobility: Rolling;Sidelying to Sit;Sit to Supine Rolling: +2 for physical assistance;Max assist Sidelying to sit: +2 for physical assistance;Max assist;HOB elevated   Sit to supine: +2 for physical assistance;Total assist   General bed mobility comments: Assist to bring shoulders and hips over to roll. Assist to bring legs off of bed and elevate trunk into sitting. Assist with lowering trunk into supine and bringing legs back up into bed.  Transfers Overall transfer level: Needs assistance Equipment used: Rolling walker (2 wheeled);Ambulation equipment  used Transfers: Sit to/from [facility] Sit to Stand: +2 physical assistance;Min assist;Mod assist Stand pivot transfers: +2 physical assistance(with Stedy)       General transfer comment: On first sit to stand from bed pt able to come up with +2 min assist with rolling walker. On subsequent stands pt required +2 mod assist. Attempted stand pivot with walker and pt unable to take pivotal steps to chair. Used Stedy to transfer bed to recliner back to bed.   Ambulation/Gait             General Gait Details: Unable  Stairs            Wheelchair Mobility    Modified Rankin (Stroke Patients Only)       Balance Overall balance assessment: Needs assistance Sitting-balance support: Bilateral upper extremity supported;Feet supported Sitting balance-Leahy Scale: Poor Sitting balance - Comments: Sat EOB > 20 minutes. Required min assist to min guard. Postural control: Right lateral lean Standing balance support: Bilateral upper extremity supported Standing balance-Leahy Scale: Poor Standing balance comment: Stood with walker or stedy with min assist for 20-60 seconds                             Pertinent Vitals/Pain Pain Assessment: Faces Faces Pain Scale: Hurts even more Pain Location: rt abdomen Pain Descriptors / Indicators: Grimacing;Guarding Pain Intervention(s): Limited activity within patient's tolerance;Monitored during session;Repositioned    Home Living Family/patient expects to be discharged to:: Skilled nursing facility Living Arrangements: Spouse/significant other Available Help at Discharge: Family Type of Home: House Home Access: Stairs to enter   Technical brewer of Steps: 3 Home Layout: Able to live on main level with bedroom/bathroom Home  Equipment: Gilford Rile - 4 wheels      Prior Function Level of Independence: Needs assistance   Gait / Transfers Assistance Needed: At [facility] from [facility] on 2/24 pt amb 500' with min assist  and walker. Prior to month long hospitalization at [facility] pt amb mod independently with walker           Hand Dominance        Extremity/Trunk Assessment   Upper Extremity Assessment Upper Extremity Assessment: Defer to OT evaluation    Lower Extremity Assessment Lower Extremity Assessment: Generalized weakness       Communication   Communication: Other (comment)(Very soft voice)  Cognition Arousal/Alertness: Lethargic Behavior During Therapy: Flat affect Overall Cognitive Status: Impaired/Different from baseline Area of Impairment: Orientation;Attention;Memory;Following commands;Safety/judgement;Awareness;Problem solving                 Orientation Level: Disoriented to;Situation;Time;Place Current Attention Level: Sustained Memory: Decreased short-term memory Following Commands: Follows one step commands with increased time Safety/Judgement: Decreased awareness of safety;Decreased awareness of deficits Awareness: Intellectual Problem Solving: Slow processing;Decreased initiation;Requires verbal cues;Requires tactile cues        General Comments      Exercises     Assessment/Plan    PT Assessment Patient needs continued PT services  PT Problem List Decreased strength;Decreased activity tolerance;Decreased balance;Decreased mobility;Decreased cognition;Decreased knowledge of use of DME;Obesity;Pain       PT Treatment Interventions DME instruction;Gait training;Functional mobility training;Therapeutic activities;Therapeutic exercise;Balance training;Patient/family education;Cognitive remediation    PT Goals (Current goals can be found in the Care Plan section)  Acute Rehab PT Goals Patient Stated Goal: Not stated PT Goal Formulation: With patient/family Time For Goal Achievement: 10/17/17 Potential to Achieve Goals: Good    Frequency Min 2X/week   Barriers to discharge        Co-evaluation PT/OT/SLP Co-Evaluation/Treatment: Yes Reason for  Co-Treatment: Complexity of the patient's impairments (multi-system involvement);Necessary to address cognition/behavior during functional activity;For patient/therapist safety PT goals addressed during session: Mobility/safety with mobility;Balance         AM-PAC PT "6 Clicks" Daily Activity  Outcome Measure Difficulty turning over in bed (including adjusting bedclothes, sheets and blankets)?: Unable Difficulty moving from lying on back to sitting on the side of the bed? : Unable Difficulty sitting down on and standing up from a chair with arms (e.g., wheelchair, bedside commode, etc,.)?: Unable Help needed moving to and from a bed to chair (including a wheelchair)?: Total Help needed walking in hospital room?: Total Help needed climbing 3-5 steps with a railing? : Total 6 Click Score: 6    End of Session Equipment Utilized During Treatment: (no gait belt due to nephrostomy tubes and colostomy) Activity Tolerance: Patient limited by fatigue;Patient limited by lethargy Patient left: in bed;with call bell/phone within reach;with family/visitor present Nurse Communication: Mobility status;Need for lift equipment(Stedy used) PT Visit Diagnosis: Other abnormalities of gait and mobility (R26.89);Muscle weakness (generalized) (M62.81);Difficulty in walking, not elsewhere classified (R26.2) Pain - Right/Left: Right Pain - part of body: (abdomen)    Time: 5681-2751 PT Time Calculation (min) (ACUTE ONLY): 55 min   Charges:   PT Evaluation $PT Eval Moderate Complexity: 1 Mod PT Treatments $Therapeutic Activity: 8-22 mins   PT G CodesMarland Kitchen        [facility] 10/03/2017, 3:55 PM

## 2017-10-03 NOTE — Progress Notes (Signed)
   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  Physician was leaving as I entered.  Patient alone in the bed.  I have visited with this patient at [facility].  Patient was quiet, but seemed to know I was there.  Sat for a bit at bedside and prayed for her.  Will follow and support as needed. Chaplain Katherene Ponto

## 2017-10-03 NOTE — Procedures (Addendum)
Central Venous Catheter Insertion Procedure Note [name] [id number] 1956/03/07  Procedure: Insertion of Central Venous Catheter Indications: Assessment of intravascular volume, Drug and/or fluid administration and Frequent blood sampling  Procedure Details Consent: Risks of procedure as well as the alternatives and risks of each were explained to the (patient/caregiver).  Consent for procedure obtained. Time Out: Verified patient identification, verified procedure, site/side was marked, verified correct patient position, special equipment/implants available, medications/allergies/relevent history reviewed, required imaging and test results available.  Performed  Maximum sterile technique was used including antiseptics, cap, gloves, gown, hand hygiene, mask and sheet. Skin prep: Chlorhexidine; local anesthetic administered A antimicrobial bonded/coated triple lumen catheter was placed in the left internal jugular vein using the Seldinger technique. Ultrasound guidance used.Yes.   Catheter placed to 20 cm. Blood aspirated via all 3 ports and then flushed x 3. Line sutured x 2 and dressing applied.  Evaluation Blood flow good Complications: No apparent complications Patient did tolerate procedure well. Chest X-ray ordered to verify placement.  CXR: pending.  Georgann Housekeeper, AGACNP-BC [place] Pulmonology/Critical Care Pager [phone] or [phone]  10/03/2017 11:42 AM   CXR post procedure:  Left internal jugular central venous line has its tip projecting at the caval atrial junction. No pneumothorax. Cardiac silhouette is normal in size. No mediastinal or hilar masses. Clear lungs. IMPRESSION: 1. Left internal jugular central venous line is well positioned, tip projecting at the caval atrial junction. No pneumothorax. 2. No acute cardiopulmonary disease. 10/03/2017 12:17     --[name]  Pulmonology/Critical Care  [phone]

## 2017-10-03 NOTE — Progress Notes (Signed)
Initial Nutrition Assessment  DOCUMENTATION CODES:   Obesity unspecified  INTERVENTION:   -MVI daily -30 ml Prostat TID, each supplement provides 100 kcals and 15 grams protein -If prolonged poor oral intake is anticipated, recommend:  Initiate Osmolite 1.5 @ 25 ml/hr and increase by 10 ml every 4 hours to goal rate of 45 ml/hr.   30 ml Prostat TID.    Tube feeding regimen provides 1920 kcal (100% of needs), 113 grams of protein, and 823 ml of H2O.   NUTRITION DIAGNOSIS:   Increased nutrient needs related to wound healing as evidenced by estimated needs.  GOAL:   Patient will meet greater than or equal to 90% of their needs  MONITOR:   PO intake, Supplement acceptance, Labs, Weight trends, Skin, I & O's, Diet advancement  REASON FOR ASSESSMENT:   Consult Poor PO  ASSESSMENT:   62 y.o. female  with past medical history of endometrial cancer and complicated surgical history including hysterectomy, chronic stent placement, colostomy reversal with ureteral construction and subsequent bladder leak with colovesicular fistula who had bilateral nephrostomy tubes placed in February admitted on 10/02/2017 with confusion and lethargy likely subsequent from sepsis related to UTI.   Pt admitted with sepsis related to UTI.   Pt very lethargic and unable to participate in interview; no family at bedside to provide further hx. Case discussed with RN, who reports medications and PO's are on hold due to lethargy. SLP evaluated pt earlier and recommended continue NPO due to mental status. Per RN, pt was tracking this RD during visit, which is an improvement from earlier this shift.   Reviewed CWOCN note.   Reviewed I/O's: +1.9 L since admission.   Pt with increased nutrient needs related to wound healing. If prolonged NPO/poor po intake is suspected, pt may benefit from alternative means of nutrition and hydration.   Labs reviewed: CBGS: 93-119 (inpatient orders for glycemic control are  2-12 units insulin aspart TID and 7 units insulin aspart TID).   NUTRITION - FOCUSED PHYSICAL EXAM:    Most Recent Value  Orbital Region  No depletion  Upper Arm Region  No depletion  Thoracic and Lumbar Region  No depletion  Buccal Region  No depletion  Temple Region  No depletion  Clavicle Bone Region  No depletion  Clavicle and Acromion Bone Region  No depletion  Scapular Bone Region  No depletion  Dorsal Hand  No depletion  Patellar Region  No depletion  Anterior Thigh Region  No depletion  Posterior Calf Region  No depletion  Edema (RD Assessment)  Mild  Hair  Reviewed  Eyes  Reviewed  Mouth  Reviewed  Skin  Reviewed  Nails  Reviewed       Diet Order:  Diet full liquid Room service appropriate? Yes; Fluid consistency: Thin  EDUCATION NEEDS:   Not appropriate for education at this time  Skin:  Skin Assessment: Skin Integrity Issues: Skin Integrity Issues:: Stage I, Unstageable Stage I: lt heel Stage II: n/a Unstageable: sacrum x 3  Last BM:  10/03/17 (200 ml via ileostomy)  Height:   Ht Readings from Last 1 Encounters:  09/05/17 5\' 2"  (1.575 m)    Weight:   Wt Readings from Last 1 Encounters:  10/02/17 197 lb 12 oz (89.7 kg)    Ideal Body Weight:  50 kg  BMI:  Body mass index is 36.17 kg/m.  Estimated Nutritional Needs:   Kcal:  1750-1950  Protein:  100-115 grams  Fluid:  > 1.7 L  Nahiem Dredge A. Jimmye Norman, RD, LDN, CDE Pager: [phone] After hours Pager: [phone]

## 2017-10-03 NOTE — Progress Notes (Signed)
SLP Cancellation Note  Patient Details Name: [name] MRN: [id number] DOB: 07-29-1956   Cancelled treatment:       Reason Eval/Treat Not Completed: Fatigue/lethargy limiting ability to participate. Will continue efforts.   [place] Siren 10/03/2017, 10:08 AM   Orbie Pyo Colvin Caroli.Ed [employer] [phone]

## 2017-10-03 NOTE — Progress Notes (Signed)
Due to patient lethargy, RN will hold all PO medications until speech eval is obtained. RN has notified MD.

## 2017-10-03 NOTE — Progress Notes (Signed)
PROGRESS NOTE    [name]  WUJ:[id number] DOB: 01-23-1956 DOA: 10/02/2017 PCP: Ann Held, DO Brief Narrative:62 y.o. female with medical history significant of endometrial cancer in the past with a recurrence in 2016.  She underwent a complex hysterectomy and had a right ureteral stricture and injury requiring chronic stent placement at that time.  In August 2018 she underwent bursal of her colostomy and construction of her ureter.  At the time the bladder seem to function normal but over the ensuing months it was determined that the patient had a bladder leak.  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  In an attempt to allow the fistula to heal itself in February the patient had bilateral nephrostomy tubes placed.  A Foley catheter was also placed.  She was discharged February 23 to a skilled nursing facility for continued care.  Having a great deal of pain and palliative care saw her for pain management.  At the time she discharged on fentanyl patches as well as oral Dilaudid.  She has continued to have a great deal of pain on her left flank.  Today she remains very tender on the left flank.  She presents today with delirium and significant worsening of her renal function.  Evaluation both by physical exam and CT scan revealed that the nephrostomy tube on the left is no longer in position and that there is some left-sided mild hydronephrosis with fluid collection.  The left proximal ureter is distended in the distal ureter is not well-defined.  The previously visualized presacral fluid collection is no longer discretely visualized.  There is some soft tissue attenuation in that space.  Additionally she has new lower lobe airspace opacities at the lungs perhaps consistent with pneumonia.  The patient is able to give very little history her speech is very difficult to understand she is uncomfortable, she is moaning  and groaning.  Her husband is appropriately frustrated at her lack of improvement despite extensive amounts of medical care.  He reiterates to me that her cancer is completely gone but she does not appear to be getting better.  Most of the history is obtained from the medical record, the patient's husband and the ER physician.  The patient is really not in any condition to give any cogent history.   She will be admitted into the hospital for further evaluation and management of acute on chronic kidney disease, delirium, possible UTI, possible pneumonia and further evaluation and management of her colovesicular fistula which has recently acquired placement of nephrostomy tubes, consult with urology, consult with nephrology, consult with infectious disease, and consult with palliative care.  Patient will need a holistic team based approach to her management    Assessment & Plan:   Principal Problem:   Acute renal failure superimposed on stage 3 chronic kidney disease (HCC) Active Problems:   Chronic neutropenia (HCC)   Primary hypothyroidism   DM type 2 (diabetes mellitus, type 2) (HCC)   Benign essential HTN   History of ovarian & endometrial cancer   Abdominal pain   Ureteral stricture, right, 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) 04/01/2017   Protein-calorie malnutrition, moderate (HCC)   Wound of right buttock   Pressure injury of skin   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  Acute renal failure superimposed on stage III chronic kidney disease: Suspect this  is due to new onset renal failure likely due to poor clearance of urine due to nephrostomy tube being out of position.  Patient had successful left-sided nephrostomy tube replacement and right-sided exchange and upsizing of the nephrostomy tube to 12 [place]. We will continue to monitor renal function closely and avoid nephrotoxic  agents and hydrate judiciously.  Continue bicarb drip per renal.  3.  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  Urology following.  4.  Metabolic acidosis: Likely related to acute renal failure.  Hopefully placement of nephrostomy tube and hydration will resolve this problem will monitor closely recheck labs in a.m. continue bicarb drip.  5.  Chronic neutropenia: Patient takes Neupogen for life every 6 days.  6.  Ureteral stricture on the right status post resection/reimplantation into the bladder (Heineke -Mikulicz with psoas hitch) 04/01/2017  urology following.  7.  Protein calorie malnutrition moderate: We will consult nutritionist as well as continue supplemental products.  8.  Wound of right buttock: We will consult wound care.  9.  Decubitus ulcer of sacral region stage II: Wound care has been consulted we will asked him to reevaluate this.  10.  Primary hypothyroidism noted continue home levothyroxine dose.  11.  Diabetes type 2: Continue home Lantus insulin dosing and sliding scale coverage suspect patient's glucoses will be fairly labile until renal function returns to normal.  12.  Benign essential hypertension: Continue home medications.  13.  History of ovarian and endometrial cancer: Patient is currently cancer free all of her problems arise from complications due to the very complicated surgery she had in 2016 and in August 2018      DVT prophylaxis: Lovenox Code Status: Full code Family Communication: No family in the room  Disposition Plan: TBD Consultants: Nephrology, infectious disease, urology, palliative care, wound care consult  Procedures: None Antimicrobials: Daptomycin and meropenem  Subjective: Patient is awake moaning in pain does not respond to commands.   Objective: Vitals:   10/03/17 0500 10/03/17 0513 10/03/17  0600 10/03/17 0800  BP: (!) 98/42 (!) 109/50 (!) 117/49 (!) 116/50  Pulse: 92 97 92 92  Resp: 17 16 14 14   Temp:  98.3 F (36.8 C)  98.4 F (36.9 C)  TempSrc:  Axillary  Axillary  SpO2: 92% 95% 94% 95%  Weight:        Intake/Output Summary (Last 24 hours) at 10/03/2017 1126 Last data filed at 10/03/2017 0835 Gross per 24 hour  Intake 1512 ml  Output 1100 ml  Net 412 ml   Filed Weights   10/02/17 1857  Weight: 89.7 kg (197 lb 12 oz)    Examination:  General exam: Moaning in pain, very lethargic. Respiratory system: Clear to auscultation. Respiratory effort normal. Cardiovascular system: S1 & S2 heard, RRR. No JVD, murmurs, rubs, gallops or clicks. No pedal edema. Gastrointestinal system: Abdomen is nondistended, soft and nontender. No organomegaly or masses felt. Normal bowel sounds heard.  Colostomy bag in place.  Bilateral nephrostomy tubes in place. Central nervous system: Awake does not follow commands. Skin: Multiple pressure ulcers.   Psychiatry: Judgement and insight appear normal. Mood & affect appropriate.     Data Reviewed: I have personally reviewed following labs and imaging studies  CBC: Recent Labs  Lab 10/02/17 0729 10/03/17 0253  WBC 2.9* 4.5  NEUTROABS 1.5*  --   HGB 9.9* 8.3*  HCT 32.2* 27.2*  MCV  94.7 92.5  PLT 387 144   Basic Metabolic Panel: Recent Labs  Lab 10/02/17 0729 10/02/17 2221 10/03/17 0253  NA 136  --  141  K 4.0  --  3.6  CL 109  --  112*  CO2 14*  --  16*  GLUCOSE 123*  --  155*  BUN 52*  --  52*  CREATININE 3.74*  --  3.27*  CALCIUM 9.2  --  8.5*  MG  --  1.1*  --   PHOS  --   --  5.3*   GFR: Estimated Creatinine Clearance: 18.3 mL/min (A) (by C-G formula based on SCr of 3.27 mg/dL (H)). Liver Function Tests: Recent Labs  Lab 10/02/17 0729 10/03/17 0253  AST 15  --   ALT 20  --   ALKPHOS 248*  --   BILITOT 0.4  --   PROT 7.7  --   ALBUMIN 1.9* 1.5*   No results for input(s): LIPASE, AMYLASE in the last 168  hours. No results for input(s): AMMONIA in the last 168 hours. Coagulation Profile: Recent Labs  Lab 10/02/17 2221  INR 1.34   Cardiac Enzymes: Recent Labs  Lab 10/02/17 2221 10/03/17 0253  CKTOTAL 16* 18*   BNP (last 3 results) No results for input(s): PROBNP in the last 8760 hours. HbA1C: No results for input(s): HGBA1C in the last 72 hours. CBG: Recent Labs  Lab 10/02/17 0653 10/02/17 2043 10/03/17 0834  GLUCAP 100* 141* 119*   Lipid Profile: No results for input(s): CHOL, HDL, LDLCALC, TRIG, CHOLHDL, LDLDIRECT in the last 72 hours. Thyroid Function Tests: No results for input(s): TSH, T4TOTAL, FREET4, T3FREE, THYROIDAB in the last 72 hours. Anemia Panel: No results for input(s): VITAMINB12, FOLATE, FERRITIN, TIBC, IRON, RETICCTPCT in the last 72 hours. Sepsis Labs: Recent Labs  Lab 10/02/17 1231  LATICACIDVEN 0.9    Recent Results (from the past 240 hour(s))  Urine culture     Status: None (Preliminary result)   Collection Time: 10/02/17  8:39 AM  Result Value Ref Range Status   Specimen Description URINE, RANDOM  Final   Special Requests NONE  Final   Culture   Final    CULTURE REINCUBATED FOR BETTER GROWTH Performed at [facility], [place] [address], [place], [place] [address]    Report Status PENDING  Incomplete  MRSA PCR Screening     Status: Abnormal   Collection Time: 10/02/17  6:54 PM  Result Value Ref Range Status   MRSA by PCR POSITIVE (A) NEGATIVE Final    Comment:        The GeneXpert MRSA Assay (FDA approved for NASAL specimens only), is one component of a comprehensive MRSA colonization surveillance program. It is not intended to diagnose MRSA infection nor to guide or monitor treatment for MRSA infections. RESULT CALLED TO, READ BACK BY AND VERIFIED WITH: TDECAREAUX,RN @2050  10/02/17 BY LHOWARD Performed at [facility], [place] [address] [place], [place] [address]          Radiology Studies: Ct Abdomen Pelvis Wo  Contrast  Result Date: 10/02/2017 CLINICAL DATA:  Pt has sepsis Had recent enterococcus wound infection and also serratia UTI EXAM: CT ABDOMEN AND PELVIS WITHOUT CONTRAST TECHNIQUE: Multidetector CT imaging of the abdomen and pelvis was performed following the standard protocol without IV contrast. COMPARISON:  09/22/2017 FINDINGS: Lower chest: There is patchy opacity in the lower lobes at the lung bases, right greater than left. This is new since the prior CT.  This may all be atelectasis. Pneumonia should be considered if there are consistent clinical findings. No pulmonary edema. No pleural effusion. Hepatobiliary: Normal liver. Gallbladder surgically absent. Mild prominence of common bile duct with normal distal tapering. Pancreas: No pancreatic mass or inflammation. Mild pancreatic atrophy. Spleen: Normal in size without focal abnormality. Adrenals/Urinary Tract: 17 mm left adrenal mass, stable from the prior CT. Left kidney is swollen with a poorly defined collecting system that appears at least mildly dilated. There is a nonobstructing stone in the lower pole. Right ureter is mildly dilated. It is not well visualized distally. No convincing ureteral stone. A nephrostomy catheter lies within the right kidney. No significant right hydronephrosis. Right ureter is not well followed distally. No convincing ureteral stone. No renal masses. Bladder is decompressed with a Foley catheter. Nephrostomy tube on the left has been removed since the prior CT. Stomach/Bowel: No bowel obstruction or convincing inflammation. Right mid abdomen colostomy. Stomach is unremarkable. Vascular/Lymphatic: There are prominent retroperitoneal lymph nodes, none pathologically enlarged, all stable from the prior CT. No vascular abnormality. Reproductive: Uterus is surgically absent.  No discrete pelvic mass. Other: The presacral collection noted on the prior CT dated 09/15/2017, is no longer discretely seen. There is still soft tissue  attenuation in this location adjacent to surgical vascular clips. The left posterior pelvic pigtail catheter has been removed since the prior exam. No significant ascites. Musculoskeletal: No fracture or acute finding. No osteoblastic or osteolytic lesions. IMPRESSION: 1. Since the prior CT, the left posterior pelvic pigtail catheter has been removed. This catheter had withdrawn into the gluteus maximus from the presacral collection seen on the CT dated 09/15/2017. 2. The presacral fluid collection, where the pigtail catheter had originally been placed, is no longer discretely visualized, although there is still soft tissue attenuation in the presacral space. The residual soft tissue attenuation may be postsurgical scarring/change. No definite residual or recurrent abscess. 3. There are new lower lobe airspace opacities at the lung bases, which may all be atelectasis. Pneumonia should be suspected if there are consistent clinical findings. 4. Left nephrostomy tube has been removed since the prior CT. Left kidney appears swollen with at least mild hydronephrosis. The proximal left ureter is mildly distended. The distal ureter is not well-defined. 5. No change in the appearance of the right kidney with stable positioning of the right nephrostomy tube. 6. Changes from bowel surgery and formation of the right mid to upper quadrant colostomy are stable. Electronically Signed   By: Lajean Manes M.D.   On: 10/02/2017 10:13   Ct Head Wo Contrast  Result Date: 10/02/2017 CLINICAL DATA:  Altered mental status.  Nonverbal. EXAM: CT HEAD WITHOUT CONTRAST TECHNIQUE: Contiguous axial images were obtained from the base of the skull through the vertex without intravenous contrast. COMPARISON:  None. FINDINGS: Brain: No evidence of acute infarction, hemorrhage, hydrocephalus, extra-axial collection or mass lesion/mass effect. Vascular: No hyperdense vessel or unexpected calcification. Skull: Normal. Negative for fracture or  focal lesion. Sinuses/Orbits: Globes and orbits are unremarkable. There is a small amount of dependent fluid in the right sphenoid sinus. Sinuses otherwise clear. Clear mastoid air cells. Other: None. IMPRESSION: No intracranial abnormality. Electronically Signed   By: Lajean Manes M.D.   On: 10/02/2017 12:04   Dg Chest Portable 1 View  Result Date: 10/02/2017 CLINICAL DATA:  Lethargy, dyspnea EXAM: PORTABLE CHEST 1 VIEW COMPARISON:  08/31/2017 FINDINGS: Lungs are clear.  No pleural effusion or pneumothorax. The heart is normal in size. IMPRESSION: No  evidence of acute cardiopulmonary disease. Electronically Signed   By: Julian Hy M.D.   On: 10/02/2017 08:12   Ir Nephrostomy Placement Left  Result Date: 10/02/2017 INDICATION: History of endometrial cancer complicated by development of bilateral ureteral obstruction, post bilateral percutaneous nephrostomy catheter placement on 09/02/2017. Unfortunately, the patient has now been admitted with interval removal of left-sided nephrostomy catheter, as well as findings worrisome for impending urosepsis with markedly cloudy urine within the right nephrostomy. As such, request made for image guided replacement of left-sided nephrostomy catheter as well as right-sided nephrostomy catheter exchange and up sizing. EXAM: 1. FLUOROSCOPIC GUIDED REPLACEMENT OF LEFT-SIDED NEPHROSTOMY CATHETER 2. FLUOROSCOPIC GUIDED EXCHANGE OF RIGHT-SIDED NEPHROSTOMY CATHETER EXCHANGE COMPARISON:  CT abdomen and pelvis - 10/02/2017; 09/22/2017; ultrasound and fluoroscopic guided bilateral percutaneous nephrostomy catheter placement - 09/02/2017 CONTRAST:  A total of 20 mL Isovue-300 administered was administered into both collecting systems FLUOROSCOPY TIME:  4 minute 18 seconds (88 mGy) COMPLICATIONS: None immediate. TECHNIQUE: Informed written consent was obtained from the the patient's family after a discussion of the risks, benefits and alternatives to treatment. Questions  regarding the procedure were encouraged and answered. A timeout was performed prior to the initiation of the procedure. The bilateral flanks and external portions of the existing right-sided percutaneous nephrostomy catheter and entrance site of inadvertently removed left-sided nephrostomy catheter were prepped and draped in the usual sterile fashion. A sterile drape was applied covering the operative field. Maximum barrier sterile technique with sterile gowns and gloves were used for the procedure. A timeout was performed prior to the initiation of the procedure. A pre procedural spot fluoroscopic image was obtained. The entrance site of the inadvertently removed left-sided nephrostomy catheter was cannulated with a Christmas tree adapter. Contrast injection was performed. With the use of a regular glidewire, a Kumpe catheter was advanced through the subcutaneous tract to the level of the left renal pelvis. Contrast injection confirmed appropriate positioning. Next, over a short Amplatz wire, a new 98 French percutaneous drainage catheter was advanced through the existing nephrostomy catheter track with end ultimately coiled and locked within the left renal pelvis. Contrast injection confirmed appropriate positioning. A sample of urine was aspirated, capped and sent to the laboratory for analysis. Attention was now paid towards the exchange and up sizing of the right-sided nephrostomy catheter. Contrast injection confirmed appropriate positioning and functionality of existing right-sided nephrostomy catheter. A small amount of urine was aspirated from the existing right-sided nephrostomy catheter, capped and sent to the laboratory for analysis. Next, the external portion of the nephrostomy catheter was cut and cannulated with a short Amplatz wire which was coiled within the right renal pelvis. Under intermittent fluoroscopic guidance, the existing 10 French nephrostomy catheter was exchanged for a new, slightly  larger now 12 French nephrostomy catheter with end ultimately coiled and locked within the right renal pelvis. Contrast injection confirmed appropriate positioning. Both nephrostomy catheters were secured at the skin entrance site with 2 interrupted sutures and StatLock devices. Both nephrostomy catheters were reconnected to gravity bags. Dressings were placed. The patient tolerated the above procedures well without immediate postprocedural complication. FINDINGS: Subcutaneous tract injection demonstrated persistent patency of the left-sided percutaneous nephrostomy catheter tract. Successful fluoroscopic guided recanalization of the left-sided nephrostomy catheter track allowing placement of a new 12 French percutaneous drainage catheter with end coiled end locked within the left renal pelvis. Successful fluoroscopic guided exchange and up sizing of new now 19 French right-sided percutaneous nephrostomy catheter with end coiled and locked within the  right renal pelvis. IMPRESSION: 1. Successful fluoroscopic guided recanalization of the left-sided nephrostomy catheter track allowing placement of a new 12 French percutaneous drainage catheter with and coiled end locked within the left renal pelvis. 2. Successful fluoroscopic guided exchange and up sizing of new now 82 French right-sided percutaneous nephrostomy catheter with end coiled and locked within the right renal pelvis. 3. Urine sample capped and sent to the laboratory for analysis. Electronically Signed   By: Sandi Mariscal M.D.   On: 10/02/2017 18:29   Ir Nephrostomy Exchange Right  Result Date: 10/02/2017 INDICATION: History of endometrial cancer complicated by development of bilateral ureteral obstruction, post bilateral percutaneous nephrostomy catheter placement on 09/02/2017. Unfortunately, the patient has now been admitted with interval removal of left-sided nephrostomy catheter, as well as findings worrisome for impending urosepsis with markedly  cloudy urine within the right nephrostomy. As such, request made for image guided replacement of left-sided nephrostomy catheter as well as right-sided nephrostomy catheter exchange and up sizing. EXAM: 1. FLUOROSCOPIC GUIDED REPLACEMENT OF LEFT-SIDED NEPHROSTOMY CATHETER 2. FLUOROSCOPIC GUIDED EXCHANGE OF RIGHT-SIDED NEPHROSTOMY CATHETER EXCHANGE COMPARISON:  CT abdomen and pelvis - 10/02/2017; 09/22/2017; ultrasound and fluoroscopic guided bilateral percutaneous nephrostomy catheter placement - 09/02/2017 CONTRAST:  A total of 20 mL Isovue-300 administered was administered into both collecting systems FLUOROSCOPY TIME:  4 minute 18 seconds (88 mGy) COMPLICATIONS: None immediate. TECHNIQUE: Informed written consent was obtained from the the patient's family after a discussion of the risks, benefits and alternatives to treatment. Questions regarding the procedure were encouraged and answered. A timeout was performed prior to the initiation of the procedure. The bilateral flanks and external portions of the existing right-sided percutaneous nephrostomy catheter and entrance site of inadvertently removed left-sided nephrostomy catheter were prepped and draped in the usual sterile fashion. A sterile drape was applied covering the operative field. Maximum barrier sterile technique with sterile gowns and gloves were used for the procedure. A timeout was performed prior to the initiation of the procedure. A pre procedural spot fluoroscopic image was obtained. The entrance site of the inadvertently removed left-sided nephrostomy catheter was cannulated with a Christmas tree adapter. Contrast injection was performed. With the use of a regular glidewire, a Kumpe catheter was advanced through the subcutaneous tract to the level of the left renal pelvis. Contrast injection confirmed appropriate positioning. Next, over a short Amplatz wire, a new 4 French percutaneous drainage catheter was advanced through the existing  nephrostomy catheter track with end ultimately coiled and locked within the left renal pelvis. Contrast injection confirmed appropriate positioning. A sample of urine was aspirated, capped and sent to the laboratory for analysis. Attention was now paid towards the exchange and up sizing of the right-sided nephrostomy catheter. Contrast injection confirmed appropriate positioning and functionality of existing right-sided nephrostomy catheter. A small amount of urine was aspirated from the existing right-sided nephrostomy catheter, capped and sent to the laboratory for analysis. Next, the external portion of the nephrostomy catheter was cut and cannulated with a short Amplatz wire which was coiled within the right renal pelvis. Under intermittent fluoroscopic guidance, the existing 10 French nephrostomy catheter was exchanged for a new, slightly larger now 12 French nephrostomy catheter with end ultimately coiled and locked within the right renal pelvis. Contrast injection confirmed appropriate positioning. Both nephrostomy catheters were secured at the skin entrance site with 2 interrupted sutures and StatLock devices. Both nephrostomy catheters were reconnected to gravity bags. Dressings were placed. The patient tolerated the above procedures well without immediate postprocedural  complication. FINDINGS: Subcutaneous tract injection demonstrated persistent patency of the left-sided percutaneous nephrostomy catheter tract. Successful fluoroscopic guided recanalization of the left-sided nephrostomy catheter track allowing placement of a new 12 French percutaneous drainage catheter with end coiled end locked within the left renal pelvis. Successful fluoroscopic guided exchange and up sizing of new now 82 French right-sided percutaneous nephrostomy catheter with end coiled and locked within the right renal pelvis. IMPRESSION: 1. Successful fluoroscopic guided recanalization of the left-sided nephrostomy catheter track  allowing placement of a new 12 French percutaneous drainage catheter with and coiled end locked within the left renal pelvis. 2. Successful fluoroscopic guided exchange and up sizing of new now 51 French right-sided percutaneous nephrostomy catheter with end coiled and locked within the right renal pelvis. 3. Urine sample capped and sent to the laboratory for analysis. Electronically Signed   By: Sandi Mariscal M.D.   On: 10/02/2017 18:29        Scheduled Meds: . artificial tears  1 application Both Eyes QHS  . Chlorhexidine Gluconate Cloth  6 each Topical Q0600  . collagenase   Topical Daily  . DULoxetine  30 mg Oral BID  . enoxaparin (LOVENOX) injection  30 mg Subcutaneous Q24H  . famotidine  20 mg Oral Daily  . fentaNYL  25 mcg Transdermal Q72H  . ferrous sulfate  325 mg Oral QHS  . insulin aspart  2-12 Units Subcutaneous TID WC  . insulin aspart  7 Units Subcutaneous TID WC  . insulin glargine  25 Units Subcutaneous BID  . levothyroxine  150 mcg Oral QAC breakfast  . mupirocin ointment  1 application Nasal BID  . omega-3 acid ethyl esters  1 g Oral BID  . polyvinyl alcohol  1 drop Both Eyes QHS  . pregabalin  50 mg Oral BID  . prenatal vitamin w/FE, FA  1 tablet Oral Q1200  . psyllium  1 packet Oral BID  . sertraline  75 mg Oral Daily  . sodium chloride flush  3 mL Intravenous Q12H  . sodium chloride flush  5 mL Intracatheter Q8H  . Tbo-Filgrastim  480 mcg Subcutaneous 6 days   Continuous Infusions: . DAPTOmycin (CUBICIN)  IV    . magnesium sulfate 1 - 4 g bolus IVPB    . meropenem (MERREM) IV    .  sodium bicarbonate (isotonic) infusion in sterile water 125 mL/hr at 10/02/17 1634     LOS: 1 day     Georgette Shell, MD [facility] If 7PM-7AM, please contact night-coverage [email or web address] Password [email or web address] 10/03/2017, 11:26 AM

## 2017-10-03 NOTE — Significant Event (Addendum)
Rapid Response Event Note RN called for a second set of eyes Overview: Time Called: 2000 Event Type: Other (Comment)(second set of eyes)  Initial Focused Assessment: On arrival pt lying supine in bed, on a NRB, skin warm and dry, lethargic, not responding to verbal stimuli, withdraw to pain, pupils 3 sluggish. We removed NRB, she maintained her sats mid 90's on RA with no distress. Pt placed on NRB at 1400 by ED RN after receiving 2 mg Dilaudid IVP. Mouth is dry with blisters to her tongue and lips. Rhonchi noted to bilateral lungs with increase to Left side.  Narcan 0.2 mg IVp given with some response, pt extremely agitated, remains confused, unable to answer question or follow commands, MAE non purposefully.  Interventions: ABG 7.28/25.3/66.0/11.6 Narcan IVp with some response Placed mittens for safety Restarted Sodium Bicarb IVF Plan of Care (if not transferred): Repeat ABG at 0130 continue to monitor pt. RN Neldon Newport aware, encouraged to call for any needs.  Event Summary: Name of Physician Notified: Blount NP  at 2035    at    Outcome: Stayed in room and stabalized     [place], [place]

## 2017-10-03 NOTE — Progress Notes (Addendum)
Repeat ABG 7.32/30.7/75.2/15.5 Blount NP made aware. No new orders at this time.

## 2017-10-03 NOTE — Consult Note (Signed)
Consultation Note Date: 10/03/2017   Patient Name: [name]  DOB: [birth year]  MRN: [id number]  Age / Sex: 62 y.o., female  PCP: [name] Held, DO Referring Physician: Georgette Shell, MD  Reason for Consultation: Establishing goals of care and Pain control  HPI/Patient Profile: 62 y.o. female  with past medical history of endometrial cancer and complicated surgical history including hysterectomy, chronic stent placement, colostomy reversal with ureteral construction and subsequent bladder leak with colovesicular fistula who had bilateral nephrostomy tubes placed in February admitted on 10/02/2017 with confusion and lethargy likely subsequent from sepsis related to UTI.   Clinical Assessment and Goals of Care: I know [name] well from prior admission to [facility].  I saw and examined her today.  Her clinical condition appears much worse than last time that I met with her.  Wound care was also examining her during time of my encounter and noticed a great deal of pain and moaning with any movements.  Following this, however, she was not really awake enough to participate in conversation regarding goals of care.  She has a complicated history, and it is clear that she and her husband are both invested in continuing with aggressive interventions.  They have been clear and desire for full scope treatment.  I therefore spent most of the encounter today trying to focus on her pain regimen.  SUMMARY OF RECOMMENDATIONS   - Pain: On examination today, [name] is lethargic but able to nod yes and no to some questions.  I was present during examination by wound care team and she had a great deal of pain and moaning during examination.  My examination also revealed that she is wearing a fentanyl 50 mcg/hr patch at this time.  Date on patch is 09/29/2017.  RN removed and disposed of this patch  during my visit.  As she is less interactive than baseline, we will plan to decrease fentanyl patch to 25 mcg/h.  I am concerned that stopping it altogether would lead to uncontrolled pain as well as further complicate her picture with opioid withdrawal.  At this time, I believe that her mental status is likely more related to infection than opioids.  She did, however, respond to narcan during rapid response last evening.  I therefore think that we should focus more on smaller doses of medication more frequently and decrease her long acting agent (fentanyl patch). - At this time, she also has Dilaudid 2 mg every 2 hours as needed for breakthrough pain medication.  Based upon her renal impairment, sleepiness, and concern for hypotension, I think that she would best be served by transitioning from dilaudid to lower doses of fentanyl as it is shorter acting, causes less histamine release (which would affect blood pressure), and is not primarily renally excreted.  In the past, she has done well with a dose of 25-50 mcg as needed.  As I am decreasing her patch dose by 50%, we will plan liberalize frequency of as needed  medication to fentanyl 25-50 mcg every hour as needed.  I did discuss this with pharmacy and will continue to monitor her closely.  - Goals of care: Briefly touched on this with husband.  Plan is to continue with full-scope, aggressive therapies.  Will continue to address this once gauge her clinical response to current therapies, but family is invested in continuing aggressive therapies at this time.  Code Status/Advance Care Planning:  Full code   Symptom Management:   See above  Palliative Prophylaxis:   Delirium Protocol and Frequent Pain Assessment  Additional Recommendations (Limitations, Scope, Preferences):  Full Scope Treatment  Psycho-social/Spiritual:   Desire for further Chaplaincy support:no  Additional Recommendations: Caregiving  Support/Resources  Prognosis:    Guarded  Discharge Planning: To Be Determined      Primary Diagnoses: Present on Admission: . Acute renal failure superimposed on stage 3 chronic kidney disease ([place]) . Chronic neutropenia ([place]) . Primary hypothyroidism . Benign essential HTN . Abdominal pain . Ureteral stricture, right, 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) 04/01/2017 . Protein-calorie malnutrition, moderate ([place]) . Wound of right buttock . Pressure injury of skin . 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 Decubitus ulcer of sacral region, stage 2 . Metabolic acidosis   I have reviewed the medical record, interviewed the patient and family, and examined the patient. The following aspects are pertinent.  Past Medical History:  Diagnosis Date  . Adrenal adenoma, left 02/08/2016   CT: stable benign  . Anemia in neoplastic disease   . Benign essential HTN   . 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 Cancer of corpus uteri, except isthmus [facility])  oncologist-- [name] and [name]    10-15-2004  dx endometroid endometrial and ovarian cancer s/p  chemotheapy and surgery(TAH w/ BSO) :  recurrent 11-19-2014 post pelvic surgery and radiation 01-29-2015 to 03-10-2015  . Chronic idiopathic neutropenia (HCC)    presumed related to chemotherapy March 2006--- followed by [name] (treatment w/ G-CSF injections  . Chronic nausea   . Chronic pain    perineal/ anal  area from bladder pad irritates skin , right flank pain  . CKD stage G2/A3, GFR 60-89 and albumin creatinine ratio >300 mg/g    nephrologist-  [name]  . Diabetic retinopathy, background ([name])   . Difficult intravenous access    small veins--- hx PICC lines  . DM type 2 (diabetes mellitus, type 2) ([place])    monitored by  [name]  . Dysuria   . Environmental and seasonal allergies   . Fatty liver 02/08/2016   CT  . Generalized muscle weakness   . GERD (gastroesophageal reflux disease)   . Hiatal hernia   . History of abdominal abscess 04/16/2017   post surgery 04-01-2017  --- resolved 10/ 2018  . History of gastric polyp    2014  duodenum  . History of ileus 04/16/2017   resolved w/ no surgical intervention  . History of radiation therapy    01-29-2015 to 03-10-2015  pelvis 50.4Gy  . Hypothyroidism    monitored by [name]  . Ileostomy in place [facility]) 04/01/2017   created at same time colostomy takedown.  . Lower urinary tract symptoms (LUTS)    urge urinary  incontinence  . Mixed dyslipidemia   . Multiple  thyroid nodules    Managed by [name]. Harlow Asa  . Pelvic abscess in female 04/16/2017  . PONV (postoperative nausea and vomiting)    "scopolamine patch works for me"  . Radiation-induced dermatitis    contact dermatitis , radiation completed, rash only on ankles now.  . Seasonal allergies   . Ureteral stricture, right UROLOGIT-  [name]   CHRONIC--  TREATMENT URETERAL STENT  . 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 Vitamin D deficiency   . Wears glasses    Social History   Socioeconomic History  . Marital status: Married    Spouse name: None  . Number of children: 1  . Years of education: None  . Highest education level: None  Social Needs  . Financial resource strain: None  . Food insecurity - worry: None  . Food insecurity - inability: None  . Transportation needs - medical: None  . Transportation needs - non-medical: None  Occupational History  . 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 Smoking status: Never Smoker  . Smokeless tobacco: Never Used  Substance and Sexual Activity  . Alcohol use: Yes    Comment: rare social  . Drug use: No  . Sexual activity: None  Other Topics Concern  . None  Social  History Narrative   Exercise-- has not gotten back into it since cancer came back   Family History  Problem Relation Age of Onset  . Cancer Mother 36       stomach ca  . Hypertension Mother   . Cancer Father 95       prostate ca  . Diabetes Father   . Heart disease Father        CABG  . Breast cancer Maternal Aunt        dx in her 68s  . Lymphoma Paternal Aunt   . Brain cancer Paternal Grandfather   . Ovarian cancer Other   . Diabetes Sister   . Hypertension Brother y-10  . Heart disease Brother        CABG  . Diabetes Brother    Scheduled Meds: . artificial tears  1 application Both Eyes QHS  . Chlorhexidine Gluconate Cloth  6 each Topical Q0600  . collagenase   Topical Daily  . DULoxetine  30 mg Oral BID  . enoxaparin (LOVENOX) injection  30 mg Subcutaneous Q24H  . famotidine  20 mg Oral Daily  . fentaNYL  25 mcg Transdermal Q72H  . ferrous sulfate  325 mg Oral QHS  . insulin aspart  2-12 Units Subcutaneous TID WC  . insulin aspart  7 Units Subcutaneous TID WC  . insulin glargine  25 Units Subcutaneous BID  . levothyroxine  150 mcg Oral QAC breakfast  . mupirocin ointment  1 application Nasal BID  . omega-3 acid ethyl esters  1 g Oral BID  . polyvinyl alcohol  1 drop Both Eyes QHS  . pregabalin  50 mg Oral BID  . prenatal vitamin w/FE, FA  1 tablet Oral Q1200  . psyllium  1 packet Oral BID  . sertraline  75 mg Oral Daily  . sodium chloride flush  3 mL Intravenous Q12H  . sodium chloride flush  5 mL Intracatheter Q8H  . Tbo-Filgrastim  480 mcg Subcutaneous 6 days   Continuous Infusions: . DAPTOmycin (CUBICIN)  IV    . magnesium sulfate 1 - 4 g bolus IVPB    . meropenem (MERREM)  IV    .  sodium bicarbonate (isotonic) infusion in sterile water 125 mL/hr at 10/02/17 1634   PRN Meds:.acetaminophen, diphenhydrAMINE, famotidine, fentaNYL (SUBLIMAZE) injection, lidocaine (PF), methocarbamol, naloxone, protein supplement shake, sodium chloride, traZODone Medications  Prior to Admission:  Prior to Admission medications   Medication Sig Start Date End Date Taking? Authorizing Provider  acetaminophen (TYLENOL) 325 MG tablet Take 2 tablets (650 mg total) by mouth every 6 (six) hours as needed. 09/24/17  Yes Roxan Hockey, MD  ALPRAZolam Duanne Moron) 0.5 MG tablet Take 1 tablet (0.5 mg total) by mouth 3 (three) times daily as needed for anxiety. 09/24/17  Yes Emokpae, Courage, MD  anastrozole (ARIMIDEX) 1 MG tablet Take 1 tablet (1 mg total) by mouth daily. 09/24/17  Yes Roxan Hockey, MD  artificial tears (LACRILUBE) OINT ophthalmic ointment Place into both eyes at bedtime. Patient taking differently: Place 1 application into both eyes at bedtime.  09/24/17  Yes Emokpae, Courage, MD  Biotin 5 MG TABS Take 5 mg by mouth every morning.    Yes [provider]  Cholecalciferol (VITAMIN D3) 10000 UNITS capsule Take 10,000 Units by mouth once a week. Sunday evening's   Yes [provider]  collagenase (SANTYL) ointment Apply 1 application topically daily.   Yes [provider]  diphenhydrAMINE (BENADRYL) 25 mg capsule Take 1 capsule (25 mg total) by mouth every 8 (eight) hours as needed for itching, allergies or sleep. 09/24/17  Yes Emokpae, Courage, MD  diphenoxylate-atropine (LOMOTIL) 2.5-0.025 MG tablet Take 1 tablet by mouth 4 (four) times daily as needed for diarrhea or loose stools. TO PREVENT LOOSE BOWEL MOVEMENTS 09/24/17  Yes Emokpae, Courage, MD  DULoxetine (CYMBALTA) 30 MG capsule Take 1 capsule (30 mg total) by mouth 2 (two) times daily. 09/24/17  Yes Emokpae, Courage, MD  famotidine (PEPCID) 20 MG tablet Take 20 mg by mouth daily as needed for heartburn or indigestion.   Yes [provider]  fentaNYL (DURAGESIC - DOSED MCG/HR) 50 MCG/HR Place 50 mcg onto the skin every 3 (three) days.   Yes [provider]  ferrous sulfate 325 (65 FE) MG tablet Take 1 tablet (325 mg total) by mouth at bedtime. 09/24/17  Yes Roxan Hockey, MD  filgrastim (NEUPOGEN) 480 MCG/1.6ML injection Inject 480 mcg/1.6 ml under the skin every 6 days for life (last dose 09/22/17) 09/24/17  Yes Emokpae, Courage, MD  HYDROmorphone (DILAUDID) 4 MG tablet Take 1.5 tablets (6 mg total) by mouth every 3 (three) hours as needed for moderate pain or severe pain. 09/24/17  Yes Emokpae, Courage, MD  insulin aspart (NOVOLOG) 100 UNIT/ML injection Inject 0-20 Units into the skin 3 (three) times daily with meals. Patient taking differently: Inject 2-12 Units into the skin See admin instructions. Per sliding scale; if BS <60, call MD. If BS 201-250=2 units, 251-300=4 units, 301-350=6 units, 351-400=8 units, 401-450=10 units, BS>450=12 units 09/24/17  Yes Emokpae, Courage, MD  insulin aspart (NOVOLOG) 100 UNIT/ML injection Inject 7 Units into the skin 3 (three) times daily with meals. 09/24/17  Yes Emokpae, Courage, MD  insulin glargine (LANTUS) 100 UNIT/ML injection Inject 0.25 mLs (25 Units total) into the skin 2 (two) times daily. 09/24/17  Yes Roxan Hockey, MD  levothyroxine (SYNTHROID, LEVOTHROID) 150 MCG tablet Take 1 tablet (150 mcg total) by mouth daily before breakfast. 09/25/17  Yes Emokpae, Courage, MD  loperamide (IMODIUM) 2 MG capsule Take 1-2 capsules (2-4 mg total) by mouth every 8 (eight) hours as needed for diarrhea or loose stools (  Use if >2 BM every 8 hours). 09/24/17  Yes Emokpae, Courage, MD  loratadine (CLARITIN) 10 MG tablet Take 10 mg by mouth every morning.    Yes [provider]  methocarbamol (ROBAXIN) 750 MG tablet Take 1 tablet (750 mg total) by mouth 3 (three) times daily. Patient taking differently: Take 750 mg by mouth 3 (three) times daily as needed for muscle spasms.  09/24/17  Yes Emokpae, Courage, MD  omega-3 acid ethyl esters (LOVAZA) 1 G capsule Take 1 g by mouth 2 (two) times daily.    Yes [provider]  ondansetron (ZOFRAN-ODT) 4 MG disintegrating tablet Take 4 mg by mouth every 6 (six) hours as needed  for nausea or vomiting.  04/12/17  Yes [provider]  polyvinyl alcohol (LIQUIFILM TEARS) 1.4 % ophthalmic solution Place 1 drop into both eyes at bedtime. 09/24/17  Yes Emokpae, Courage, MD  pregabalin (LYRICA) 50 MG capsule Take 50 mg by mouth 2 (two) times daily.   Yes [provider]  Prenatal Vit-Fe Fumarate-FA (PRENATAL VITAMIN PO) Take 1 capsule by mouth daily. Takes prenatal because there are no dyes in it   Yes [provider]  prochlorperazine (COMPAZINE) 10 MG tablet Take 1 tablet (10 mg total) by mouth 2 (two) times daily as needed for nausea or vomiting. Patient taking differently: Take 10 mg by mouth 2 (two) times daily as needed for nausea or vomiting.  05/28/17  Yes Virgel Manifold, MD  promethazine (PHENERGAN) 25 MG tablet Take 1 tablet (25 mg total) by mouth every 6 (six) hours as needed for nausea or vomiting. Patient taking differently: Take 25 mg by mouth 4 (four) times daily as needed for nausea or vomiting.  05/28/17  Yes Virgel Manifold, MD  protein supplement shake (PREMIER PROTEIN) LIQD Take 325 mLs (11 oz total) by mouth 2 (two) times daily as needed (Pt to request). 09/24/17  Yes Emokpae, Courage, MD  psyllium (HYDROCIL/METAMUCIL) 95 % PACK Take 1 packet by mouth 2 (two) times daily. 09/24/17  Yes Emokpae, Courage, MD  ranitidine (ZANTAC) 150 MG tablet Take 1 tablet (150 mg total) by mouth 2 (two) times daily as needed for heartburn. 09/24/17  Yes Emokpae, Courage, MD  rosuvastatin (CRESTOR) 10 MG tablet Take 10 mg by mouth every evening.    Yes [provider]  sertraline (ZOLOFT) 50 MG tablet Take 1.5 tablets (75 mg total) by mouth daily. 09/24/17  Yes Emokpae, Courage, MD  sitaGLIPtin (JANUVIA) 100 MG tablet Take 100 mg by mouth every morning.    Yes [provider]  traZODone (DESYREL) 100 MG tablet Take 1 tablet (100 mg total) by mouth at bedtime as needed for sleep. 09/24/17  Yes Emokpae, Courage, MD  tuberculin (TUBERSOL) 5  UNIT/0.1ML injection Inject 5 Units into the skin every 14 (fourteen) days.   Yes [provider]  ciprofloxacin (CIPRO) 500 MG tablet Take 1 tablet (500 mg total) by mouth 2 (two) times daily. Patient not taking: Reported on 10/02/2017 09/24/17   Roxan Hockey, MD  famotidine-calcium carbonate-magnesium hydroxide (PEPCID COMPLETE) 10-800-165 MG chewable tablet Chew 1 tablet by mouth daily as needed (INDIGESTION). Patient not taking: Reported on 10/02/2017 09/24/17   Roxan Hockey, MD  fentaNYL (DURAGESIC - DOSED MCG/HR) 75 MCG/HR Place 1 patch (75 mcg total) onto the skin every 3 (three) days. Patient not taking: Reported on 10/02/2017 09/25/17   Roxan Hockey, MD  Hydrocortisone (GERHARDT'S BUTT CREAM) CREA Apply 1 application topically 4 (four) times daily. Patient not taking:  Reported on 10/02/2017 09/24/17   Roxan Hockey, MD  lidocaine (LIDODERM) 5 % Place 1 patch onto the skin daily. Remove & Discard patch within 12 hours Patient not taking: Reported on 10/02/2017 09/25/17   Roxan Hockey, MD  lip balm (CARMEX) ointment Apply 1 application topically 2 (two) times daily. Patient not taking: Reported on 10/02/2017 09/24/17   Roxan Hockey, MD  Polyethyl Glycol-Propyl Glycol (SYSTANE OP) Place 1 drop into both eyes at bedtime.     [provider]  pregabalin (LYRICA) 75 MG capsule Take 1 capsule (75 mg total) by mouth 2 (two) times daily. Patient not taking: Reported on 10/02/2017 09/24/17   Roxan Hockey, MD   Allergies  Allergen Reactions  . Penicillins Swelling    Facial swelling/childhood allergy Has patient had a PCN reaction causing immediate rash, facial/tongue/throat swelling, SOB or lightheadedness with hypotension: Yes Has patient had a PCN reaction causing severe rash involving mucus membranes or skin necrosis: Yes Has patient had a PCN reaction that required hospitalization yes Has patient had a PCN reaction occurring within the last 10 years: No If all of  the above answers are "NO", then may proceed with Cephalosporin use.   . Adhesive [Tape]     blisters  . Cefaclor Rash    Ceclor  . Erythromycin Other (See Comments)    Gastritis, abd cramps  . Trimethoprim Rash  . Ultram [Tramadol] Hives  . Fluconazole Rash  . Oxycodone Other (See Comments)    " I just feel weird"  . Pectin Rash    Pectin ring for stoma  . Sulfa Antibiotics Rash   Review of Systems Unable to assess due to mental status  Physical Exam  General: Awake, ill appearing, lethargic- much less interactive than my prior encounters with her, in no acute distress unless she is being moved at which point she cries out in pain.  HEENT: No bruits, no goiter, no JVD, dry MM Heart: Regular rate and rhythm. Low grade murmur appreciated. Lungs: Good air movement, scattered coarse Abdomen: Soft, nontender, nondistended, positive bowel sounds.  Ext: No significant edema  Vital Signs: BP (!) 116/50 (BP Location: Right Arm)   Pulse 92   Temp 98.4 F (36.9 C) (Axillary)   Resp 14   Wt 89.7 kg (197 lb 12 oz)   SpO2 95%   BMI 36.17 kg/m  Pain Assessment: No/denies pain   Pain Score: 8    SpO2: SpO2: 95 % O2 Device:SpO2: 95 % O2 Flow Rate: .O2 Flow Rate (L/min): 4 L/min  IO: Intake/output summary:   Intake/Output Summary (Last 24 hours) at 10/03/2017 1135 Last data filed at 10/03/2017 3338 Gross per 24 hour  Intake 1512 ml  Output 1100 ml  Net 412 ml    LBM:   Baseline Weight: Weight: 89.7 kg (197 lb 12 oz) Most recent weight: Weight: 89.7 kg (197 lb 12 oz)     Palliative Assessment/Data:   Flowsheet Rows     Most Recent Value  Intake Tab  Referral Department  Hospitalist  Unit at Time of Referral  Intermediate Care Unit  Palliative Care Primary Diagnosis  Pain  Date Notified  10/02/17  Palliative Care Type  Return patient Palliative Care  Reason for referral  Pain, Clarify Goals of Care  Date of Admission  10/02/17  Date first seen by Palliative Care   10/03/17  # of days Palliative referral response time  1 Day(s)  # of days IP prior to Palliative referral  0  Clinical Assessment  Palliative Performance Scale Score  30%  Psychosocial & Spiritual Assessment  Palliative Care Outcomes  Patient/Family meeting [name]?  No     Time Total: 80 min Greater than 50%  of this time was spent counseling and coordinating care related to the above assessment and plan.  Signed by: Micheline Rough, MD   Please contact Palliative Medicine Team phone at [phone] for questions and concerns.  For individual provider: See Shea Evans

## 2017-10-03 NOTE — Progress Notes (Signed)
Chief Complaint: Patient was seen today for (B)PCNs    Supervising Physician: Markus Daft  Patient Status: [facility] - In-pt  Subjective: Follow up (B)PCNs. Pt with previous PCN placement at [facility], discharge to SNF. (L)PCN inadvertantly came out. S/p (L)PCN replacement and exchange of right PCN. Pt seen, resting in bed. Husband at bedside.   Objective: Physical Exam: BP (!) 116/50 (BP Location: Right Arm)   Pulse 89   Temp 97.6 F (36.4 C) (Axillary)   Resp 18   Wt 197 lb 12 oz (89.7 kg)   SpO2 98%   BMI 36.17 kg/m  (L)PCN intact, site clean, secured in stat lock, dressing dry. Good UOP, still some cloudiness (R)PCN intact, site clean, secured in stat lock. Clear yellow UOP with some debris   Current Facility-Administered Medications:  .  acetaminophen (TYLENOL) tablet 650 mg, 650 mg, Oral, Q6H PRN, Lady Deutscher, MD .  artificial tears (LACRILUBE) ophthalmic ointment 1 application, 1 application, Both Eyes, QHS, Lady Deutscher, MD, 1 application at 76/19/50 2240 .  Chlorhexidine Gluconate Cloth 2 % PADS 6 each, 6 each, Topical, Q0600, Lady Deutscher, MD, 6 each at 10/03/17 [phone] .  collagenase (SANTYL) ointment, , Topical, Daily, Georgette Shell, MD .  DAPTOmycin (CUBICIN) 500 mg in sodium chloride 0.9 % IVPB, 500 mg, Intravenous, Q48H, Rolla Flatten, [facility] .  diphenhydrAMINE (BENADRYL) capsule 25 mg, 25 mg, Oral, Q8H PRN, Lady Deutscher, MD .  enoxaparin (LOVENOX) injection 30 mg, 30 mg, Subcutaneous, Q24H, Evangeline Gula, Wyatt Haste, MD .  fentaNYL (DURAGESIC - dosed mcg/hr) patch 25 mcg, 25 mcg, Transdermal, Q72H, Micheline Rough, MD, 25 mcg at 10/03/17 1316 .  fentaNYL (SUBLIMAZE) injection 25-50 mcg, 25-50 mcg, Intravenous, Q1H PRN, Domingo Cocking, Gene, MD .  insulin aspart (novoLOG) injection 2-12 Units, 2-12 Units, Subcutaneous, TID WC, Evangeline Gula, Theresa C, MD .  insulin aspart (novoLOG) injection 7 Units, 7 Units, Subcutaneous, TID WC, Evangeline Gula, Wyatt Haste,  MD .  levothyroxine (SYNTHROID, LEVOTHROID) tablet 150 mcg, 150 mcg, Oral, QAC breakfast, Lady Deutscher, MD .  lidocaine (PF) (XYLOCAINE) 1 % injection, , , PRN, Sandi Mariscal, MD, 10 mL at 10/02/17 1818 .  magnesium sulfate IVPB 2 g 50 mL, 2 g, Intravenous, Daily, [name], MD, Stopped at 10/03/17 1421 .  meropenem (MERREM) 500 mg in sodium chloride 0.9 % 100 mL IVPB, 500 mg, Intravenous, Q12H, Rolla Flatten, [facility], Stopped at 10/03/17 1330 .  mupirocin ointment (BACTROBAN) 2 % 1 application, 1 application, Nasal, BID, Lady Deutscher, MD, 1 application at 71/24/58 1300 .  naloxone [facility]) injection 0.4 mg, 0.4 mg, Intravenous, PRN, Lady Deutscher, MD, 0.2 mg at 10/02/17 2045 .  polyvinyl alcohol (LIQUIFILM TEARS) 1.4 % ophthalmic solution 1 drop, 1 drop, Both Eyes, QHS, Evangeline Gula, Wyatt Haste, MD, 1 drop at 10/02/17 2200 .  pregabalin (LYRICA) capsule 50 mg, 50 mg, Oral, BID, Evangeline Gula, Wyatt Haste, MD .  protein supplement (PREMIER PROTEIN) liquid, 11 oz, Oral, BID PRN, Lady Deutscher, MD .  psyllium (HYDROCIL/METAMUCIL) packet 1 packet, 1 packet, Oral, BID, Evangeline Gula, Wyatt Haste, MD .  sodium bicarbonate 150 mEq in sterile water 1,000 mL infusion, , Intravenous, Continuous, Blount, Xenia T, NP, Last Rate: 125 mL/hr at 10/02/17 1634 .  sodium chloride (OCEAN) 0.65 % nasal spray 1 spray, 1 spray, Each Nare, PRN, Lady Deutscher, MD .  sodium chloride flush (NS) 0.9 % injection 3 mL, 3 mL, Intravenous, Q12H, Evangeline Gula, Wyatt Haste, MD, 3 mL  at 10/02/17 1414 .  sodium chloride flush (NS) 0.9 % injection 5 mL, 5 mL, Intracatheter, Q8H, Sandi Mariscal, MD, 5 mL at 10/03/17 1316 .  Tbo-Filgrastim (GRANIX) injection 480 mcg, 480 mcg, Subcutaneous, 6 days, Lady Deutscher, MD  Labs: CBC Recent Labs    10/02/17 0729 10/03/17 0253  WBC 2.9* 4.5  HGB 9.9* 8.3*  HCT 32.2* 27.2*  PLT 387 332   BMET Recent Labs    10/02/17 0729 10/03/17 0253  NA 136 141  K 4.0 3.6  CL 109 112*   CO2 14* 16*  GLUCOSE 123* 155*  BUN 52* 52*  CREATININE 3.74* 3.27*  CALCIUM 9.2 8.5*   LFT Recent Labs    10/02/17 0729 10/03/17 0253  PROT 7.7  --   ALBUMIN 1.9* 1.5*  AST 15  --   ALT 20  --   ALKPHOS 248*  --   BILITOT 0.4  --    PT/INR Recent Labs    10/02/17 2221  LABPROT 16.5*  INR 1.34     Studies/Results: Ct Abdomen Pelvis Wo Contrast  Result Date: 10/02/2017 CLINICAL DATA:  Pt has sepsis Had recent enterococcus wound infection and also serratia UTI EXAM: CT ABDOMEN AND PELVIS WITHOUT CONTRAST TECHNIQUE: Multidetector CT imaging of the abdomen and pelvis was performed following the standard protocol without IV contrast. COMPARISON:  09/22/2017 FINDINGS: Lower chest: There is patchy opacity in the lower lobes at the lung bases, right greater than left. This is new since the prior CT. This may all be atelectasis. Pneumonia should be considered if there are consistent clinical findings. No pulmonary edema. No pleural effusion. Hepatobiliary: Normal liver. Gallbladder surgically absent. Mild prominence of common bile duct with normal distal tapering. Pancreas: No pancreatic mass or inflammation. Mild pancreatic atrophy. Spleen: Normal in size without focal abnormality. Adrenals/Urinary Tract: 17 mm left adrenal mass, stable from the prior CT. Left kidney is swollen with a poorly defined collecting system that appears at least mildly dilated. There is a nonobstructing stone in the lower pole. Right ureter is mildly dilated. It is not well visualized distally. No convincing ureteral stone. A nephrostomy catheter lies within the right kidney. No significant right hydronephrosis. Right ureter is not well followed distally. No convincing ureteral stone. No renal masses. Bladder is decompressed with a Foley catheter. Nephrostomy tube on the left has been removed since the prior CT. Stomach/Bowel: No bowel obstruction or convincing inflammation. Right mid abdomen colostomy. Stomach is  unremarkable. Vascular/Lymphatic: There are prominent retroperitoneal lymph nodes, none pathologically enlarged, all stable from the prior CT. No vascular abnormality. Reproductive: Uterus is surgically absent.  No discrete pelvic mass. Other: The presacral collection noted on the prior CT dated 09/15/2017, is no longer discretely seen. There is still soft tissue attenuation in this location adjacent to surgical vascular clips. The left posterior pelvic pigtail catheter has been removed since the prior exam. No significant ascites. Musculoskeletal: No fracture or acute finding. No osteoblastic or osteolytic lesions. IMPRESSION: 1. Since the prior CT, the left posterior pelvic pigtail catheter has been removed. This catheter had withdrawn into the gluteus maximus from the presacral collection seen on the CT dated 09/15/2017. 2. The presacral fluid collection, where the pigtail catheter had originally been placed, is no longer discretely visualized, although there is still soft tissue attenuation in the presacral space. The residual soft tissue attenuation may be postsurgical scarring/change. No definite residual or recurrent abscess. 3. There are new lower lobe airspace opacities at the lung bases,  which may all be atelectasis. Pneumonia should be suspected if there are consistent clinical findings. 4. Left nephrostomy tube has been removed since the prior CT. Left kidney appears swollen with at least mild hydronephrosis. The proximal left ureter is mildly distended. The distal ureter is not well-defined. 5. No change in the appearance of the right kidney with stable positioning of the right nephrostomy tube. 6. Changes from bowel surgery and formation of the right mid to upper quadrant colostomy are stable. Electronically Signed   By: Lajean Manes M.D.   On: 10/02/2017 10:13   Ct Head Wo Contrast  Result Date: 10/02/2017 CLINICAL DATA:  Altered mental status.  Nonverbal. EXAM: CT HEAD WITHOUT CONTRAST TECHNIQUE:  Contiguous axial images were obtained from the base of the skull through the vertex without intravenous contrast. COMPARISON:  None. FINDINGS: Brain: No evidence of acute infarction, hemorrhage, hydrocephalus, extra-axial collection or mass lesion/mass effect. Vascular: No hyperdense vessel or unexpected calcification. Skull: Normal. Negative for fracture or focal lesion. Sinuses/Orbits: Globes and orbits are unremarkable. There is a small amount of dependent fluid in the right sphenoid sinus. Sinuses otherwise clear. Clear mastoid air cells. Other: None. IMPRESSION: No intracranial abnormality. Electronically Signed   By: Lajean Manes M.D.   On: 10/02/2017 12:04   Dg Chest Port 1 View  Result Date: 10/03/2017 CLINICAL DATA:  Central line placement. EXAM: PORTABLE CHEST 1 VIEW COMPARISON:  10/02/2017 FINDINGS: Left internal jugular central venous line has its tip projecting at the caval atrial junction. No pneumothorax. Cardiac silhouette is normal in size. No mediastinal or hilar masses. Clear lungs. IMPRESSION: 1. Left internal jugular central venous line is well positioned, tip projecting at the caval atrial junction. No pneumothorax. 2. No acute cardiopulmonary disease. Electronically Signed   By: Lajean Manes M.D.   On: 10/03/2017 12:17   Dg Chest Portable 1 View  Result Date: 10/02/2017 CLINICAL DATA:  Lethargy, dyspnea EXAM: PORTABLE CHEST 1 VIEW COMPARISON:  08/31/2017 FINDINGS: Lungs are clear.  No pleural effusion or pneumothorax. The heart is normal in size. IMPRESSION: No evidence of acute cardiopulmonary disease. Electronically Signed   By: Julian Hy M.D.   On: 10/02/2017 08:12   Ir Nephrostomy Placement Left  Result Date: 10/02/2017 INDICATION: History of endometrial cancer complicated by development of bilateral ureteral obstruction, post bilateral percutaneous nephrostomy catheter placement on 09/02/2017. Unfortunately, the patient has now been admitted with interval removal of  left-sided nephrostomy catheter, as well as findings worrisome for impending urosepsis with markedly cloudy urine within the right nephrostomy. As such, request made for image guided replacement of left-sided nephrostomy catheter as well as right-sided nephrostomy catheter exchange and up sizing. EXAM: 1. FLUOROSCOPIC GUIDED REPLACEMENT OF LEFT-SIDED NEPHROSTOMY CATHETER 2. FLUOROSCOPIC GUIDED EXCHANGE OF RIGHT-SIDED NEPHROSTOMY CATHETER EXCHANGE COMPARISON:  CT abdomen and pelvis - 10/02/2017; 09/22/2017; ultrasound and fluoroscopic guided bilateral percutaneous nephrostomy catheter placement - 09/02/2017 CONTRAST:  A total of 20 mL Isovue-300 administered was administered into both collecting systems FLUOROSCOPY TIME:  4 minute 18 seconds (88 mGy) COMPLICATIONS: None immediate. TECHNIQUE: Informed written consent was obtained from the the patient's family after a discussion of the risks, benefits and alternatives to treatment. Questions regarding the procedure were encouraged and answered. A timeout was performed prior to the initiation of the procedure. The bilateral flanks and external portions of the existing right-sided percutaneous nephrostomy catheter and entrance site of inadvertently removed left-sided nephrostomy catheter were prepped and draped in the usual sterile fashion. A sterile drape was applied covering  the operative field. Maximum barrier sterile technique with sterile gowns and gloves were used for the procedure. A timeout was performed prior to the initiation of the procedure. A pre procedural spot fluoroscopic image was obtained. The entrance site of the inadvertently removed left-sided nephrostomy catheter was cannulated with a Christmas tree adapter. Contrast injection was performed. With the use of a regular glidewire, a Kumpe catheter was advanced through the subcutaneous tract to the level of the left renal pelvis. Contrast injection confirmed appropriate positioning. Next, over a short  Amplatz wire, a new 47 French percutaneous drainage catheter was advanced through the existing nephrostomy catheter track with end ultimately coiled and locked within the left renal pelvis. Contrast injection confirmed appropriate positioning. A sample of urine was aspirated, capped and sent to the laboratory for analysis. Attention was now paid towards the exchange and up sizing of the right-sided nephrostomy catheter. Contrast injection confirmed appropriate positioning and functionality of existing right-sided nephrostomy catheter. A small amount of urine was aspirated from the existing right-sided nephrostomy catheter, capped and sent to the laboratory for analysis. Next, the external portion of the nephrostomy catheter was cut and cannulated with a short Amplatz wire which was coiled within the right renal pelvis. Under intermittent fluoroscopic guidance, the existing 10 French nephrostomy catheter was exchanged for a new, slightly larger now 12 French nephrostomy catheter with end ultimately coiled and locked within the right renal pelvis. Contrast injection confirmed appropriate positioning. Both nephrostomy catheters were secured at the skin entrance site with 2 interrupted sutures and StatLock devices. Both nephrostomy catheters were reconnected to gravity bags. Dressings were placed. The patient tolerated the above procedures well without immediate postprocedural complication. FINDINGS: Subcutaneous tract injection demonstrated persistent patency of the left-sided percutaneous nephrostomy catheter tract. Successful fluoroscopic guided recanalization of the left-sided nephrostomy catheter track allowing placement of a new 12 French percutaneous drainage catheter with end coiled end locked within the left renal pelvis. Successful fluoroscopic guided exchange and up sizing of new now 24 French right-sided percutaneous nephrostomy catheter with end coiled and locked within the right renal pelvis. IMPRESSION:  1. Successful fluoroscopic guided recanalization of the left-sided nephrostomy catheter track allowing placement of a new 12 French percutaneous drainage catheter with and coiled end locked within the left renal pelvis. 2. Successful fluoroscopic guided exchange and up sizing of new now 74 French right-sided percutaneous nephrostomy catheter with end coiled and locked within the right renal pelvis. 3. Urine sample capped and sent to the laboratory for analysis. Electronically Signed   By: Sandi Mariscal M.D.   On: 10/02/2017 18:29   Ir Nephrostomy Exchange Right  Result Date: 10/02/2017 INDICATION: History of endometrial cancer complicated by development of bilateral ureteral obstruction, post bilateral percutaneous nephrostomy catheter placement on 09/02/2017. Unfortunately, the patient has now been admitted with interval removal of left-sided nephrostomy catheter, as well as findings worrisome for impending urosepsis with markedly cloudy urine within the right nephrostomy. As such, request made for image guided replacement of left-sided nephrostomy catheter as well as right-sided nephrostomy catheter exchange and up sizing. EXAM: 1. FLUOROSCOPIC GUIDED REPLACEMENT OF LEFT-SIDED NEPHROSTOMY CATHETER 2. FLUOROSCOPIC GUIDED EXCHANGE OF RIGHT-SIDED NEPHROSTOMY CATHETER EXCHANGE COMPARISON:  CT abdomen and pelvis - 10/02/2017; 09/22/2017; ultrasound and fluoroscopic guided bilateral percutaneous nephrostomy catheter placement - 09/02/2017 CONTRAST:  A total of 20 mL Isovue-300 administered was administered into both collecting systems FLUOROSCOPY TIME:  4 minute 18 seconds (88 mGy) COMPLICATIONS: None immediate. TECHNIQUE: Informed written consent was obtained from the the patient's  family after a discussion of the risks, benefits and alternatives to treatment. Questions regarding the procedure were encouraged and answered. A timeout was performed prior to the initiation of the procedure. The bilateral flanks and  external portions of the existing right-sided percutaneous nephrostomy catheter and entrance site of inadvertently removed left-sided nephrostomy catheter were prepped and draped in the usual sterile fashion. A sterile drape was applied covering the operative field. Maximum barrier sterile technique with sterile gowns and gloves were used for the procedure. A timeout was performed prior to the initiation of the procedure. A pre procedural spot fluoroscopic image was obtained. The entrance site of the inadvertently removed left-sided nephrostomy catheter was cannulated with a Christmas tree adapter. Contrast injection was performed. With the use of a regular glidewire, a Kumpe catheter was advanced through the subcutaneous tract to the level of the left renal pelvis. Contrast injection confirmed appropriate positioning. Next, over a short Amplatz wire, a new 45 French percutaneous drainage catheter was advanced through the existing nephrostomy catheter track with end ultimately coiled and locked within the left renal pelvis. Contrast injection confirmed appropriate positioning. A sample of urine was aspirated, capped and sent to the laboratory for analysis. Attention was now paid towards the exchange and up sizing of the right-sided nephrostomy catheter. Contrast injection confirmed appropriate positioning and functionality of existing right-sided nephrostomy catheter. A small amount of urine was aspirated from the existing right-sided nephrostomy catheter, capped and sent to the laboratory for analysis. Next, the external portion of the nephrostomy catheter was cut and cannulated with a short Amplatz wire which was coiled within the right renal pelvis. Under intermittent fluoroscopic guidance, the existing 10 French nephrostomy catheter was exchanged for a new, slightly larger now 12 French nephrostomy catheter with end ultimately coiled and locked within the right renal pelvis. Contrast injection confirmed  appropriate positioning. Both nephrostomy catheters were secured at the skin entrance site with 2 interrupted sutures and StatLock devices. Both nephrostomy catheters were reconnected to gravity bags. Dressings were placed. The patient tolerated the above procedures well without immediate postprocedural complication. FINDINGS: Subcutaneous tract injection demonstrated persistent patency of the left-sided percutaneous nephrostomy catheter tract. Successful fluoroscopic guided recanalization of the left-sided nephrostomy catheter track allowing placement of a new 12 French percutaneous drainage catheter with end coiled end locked within the left renal pelvis. Successful fluoroscopic guided exchange and up sizing of new now 40 French right-sided percutaneous nephrostomy catheter with end coiled and locked within the right renal pelvis. IMPRESSION: 1. Successful fluoroscopic guided recanalization of the left-sided nephrostomy catheter track allowing placement of a new 12 French percutaneous drainage catheter with and coiled end locked within the left renal pelvis. 2. Successful fluoroscopic guided exchange and up sizing of new now 56 French right-sided percutaneous nephrostomy catheter with end coiled and locked within the right renal pelvis. 3. Urine sample capped and sent to the laboratory for analysis. Electronically Signed   By: Sandi Mariscal M.D.   On: 10/02/2017 18:29    Assessment/Plan: (B)hydroneprhosis, UTI Hx (B)PCN, (L)tube accidentally out S/p replacement of (L)PCN, exchange of (R)PCN Good UOP from both, clearing IR following.    LOS: 1 day   I spent a total of 20 minutes in face to face in clinical consultation, greater than 50% of which was counseling/coordinating care for [facility]  Ascencion Dike PA-C 10/03/2017 3:46 PM

## 2017-10-03 NOTE — Progress Notes (Signed)
OT Evaluation  PTA, pt undergoing rehab at SNF. Pt currently requires Mod A +2 with mobility and Max to total A with all ADL. PT more alert and following commands once OOB to chair. Husband present for majority of session. VSS. Will follow acutely to maximize functional level of independence and recommend DC to snf for continued rehab.    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 Pt dc'd from [facility] on 2/24 after > 1 month hospitalization. Pt readmitted to [facility] on 10/02/2017 with pain, delirium and acute renal failure and found to have displacement of lt nephrostomy tube and exchange of rt nephrostomy tube on 3/3.   Precautions  Precautions Fall;Other (comment)  Precaution Comments bilateral nephrostomy tubes, colostomy  Restrictions  Weight Bearing Restrictions No  Home Living  Family/patient expects to be discharged to: [place] other  Available Help at Discharge Family  Type of [place] to enter  Entrance Stairs-Number of Steps [place] to live on main level with bedroom/bathroom  Bathroom Shower/Tub Walk-in Patent examiner - 4 wheels  Prior Function  Level of [place] Needs  assistance  Gait / [place] from [facility] on 2/24 pt amb 500' with min assist and walker. Prior to month long hospitalization at [facility] pt amb mod independently with walker  Communication  Communication Other (comment) (Very soft voice)  Pain Assessment  Pain Assessment Faces  Faces Pain Scale 6  Pain Location rt abdomen  Pain Descriptors / Indicators Grimacing;Guarding  Pain Intervention(s) Limited activity within patient's tolerance  Cognition  Arousal/Alertness Lethargic  Behavior During Therapy Flat affect  Overall Cognitive Status Impaired/Different from baseline  Area of Impairment Orientation;Attention;Memory;Following commands;Safety/judgement;Awareness;Problem solving  Orientation Level Disoriented to;Situation;Time;Place  Current Attention Level Sustained  Memory Decreased short-term memory  Following Commands Follows one step commands with increased time  Safety/Judgement Decreased awareness of safety;Decreased awareness of deficits  Awareness Intellectual  Problem Solving Slow processing;Decreased initiation;Requires verbal cues;Requires tactile cues  Upper Extremity Assessment  Upper Extremity Assessment Generalized weakness;RUE deficits/detail;LUE deficits/detail  RUE Deficits / Details difficulty reaching hand to mouth due to weakness and lethargy; Able to hold Stedy rail for transfersgeneralixed weakness  LUE Deficits / Details generazied wekness; similar to R  Lower Extremity Assessment  Lower Extremity Assessment Defer to PT evaluation  Cervical / Trunk Assessment  Cervical / Trunk Assessment Kyphotic (protective posture due to B nephrostomy tubes and colostomy)  ADL  Overall ADL's  Needs assistance/impaired  Functional mobility during ADLs Moderate assistance;+2 for physical assistance  General ADL Comments total A for all ADL this day  Bed Mobility  Overal bed mobility Needs Assistance  Bed Mobility Rolling;Sidelying to Sit;Sit to Supine   Rolling +2 for physical assistance;Max assist  Sidelying to sit +2 for physical assistance;Max assist;HOB elevated  Sit to supine +2 for physical assistance;Total assist  General  bed mobility comments Assist to bring shoulders and hips over to roll. Assist to bring legs off of bed and elevate trunk into sitting. Assist with lowering trunk into supine and bringing legs back up into bed.  Transfers  Overall transfer level Needs assistance  Equipment used Rolling walker (2 wheeled);Ambulation equipment used  Transfer via [place] to/from [employer] Transfers  Sit to Stand +2 physical assistance;Min assist;Mod assist  Stand pivot transfers +2 physical assistance (with Stedy)  General transfer comment On first sit to stand from bed pt able to come up with +2 min assist with rolling walker. On subsequent stands pt required +2 mod assist. Attempted stand pivot with walker and pt unable to take pivotal steps to chair. Used Stedy to transfer bed to recliner back to bed.   Balance  Overall balance assessment Needs assistance  Sitting-balance support Bilateral upper extremity supported;Feet supported  Sitting balance-Leahy Scale Poor  Sitting balance - Comments Sat EOB > 20 minutes. Required min assist to min guard.  Postural control Right lateral lean  Standing balance support Bilateral upper extremity supported  Standing balance-Leahy Scale Poor  Standing balance comment Stood with walker or stedy with min assist for 20-60 seconds  Exercises  Exercises Other exercises  Other Exercises  Other Exercises B prevalon boots applied - began education with husband  OT - End of Session  Equipment Utilized During Treatment Gait belt  Activity Tolerance Patient tolerated treatment well  Patient left in bed;with call bell/phone within reach;with nursing/sitter in room;with family/visitor present  Nurse Communication Mobility status;Need for lift equipment Charlaine Dalton)  OT  Assessment  OT Recommendation/Assessment Patient needs continued OT Services  OT Visit Diagnosis Unsteadiness on feet (R26.81);Muscle weakness (generalized) (M62.81);Other symptoms and signs involving cognitive function;Pain  Pain - part of body (abdomen)  OT Problem List Decreased strength;Decreased range of motion;Decreased activity tolerance;Impaired balance (sitting and/or standing);Decreased coordination;Decreased cognition;Decreased safety awareness;Decreased knowledge of use of DME or AE;Cardiopulmonary status limiting activity;Pain  OT Plan  OT Frequency (ACUTE ONLY) Min 2X/week  OT Treatment/Interventions (ACUTE ONLY) Self-care/ADL training;Therapeutic exercise;Energy conservation;DME and/or AE instruction;Therapeutic activities;Cognitive remediation/compensation;Patient/family education;Balance training  AM-PAC OT "6 Clicks" Daily Activity Outcome Measure  Help from another person eating meals? 2  Help from another person taking care of personal grooming? 1  Help from another person toileting, which includes using toliet, bedpan, or urinal? 1  Help from another person bathing (including washing, rinsing, drying)? 1  Help from another person to put on and taking off regular upper body clothing? 1  Help from another person to put on and taking off regular lower body clothing? 1  6 Click Score 7  ADL G Code Conversion CM  OT Recommendation  Follow Up Recommendations SNF;Supervision/Assistance - 24 hour  OT Equipment Other (comment) (TBA at SNF)  Individuals Consulted  Consulted and Agree with Results and Recommendations Family member/caregiver;Patient  Family Member Consulted husband  Acute Rehab OT Goals  Patient Stated Goal per family to get better  OT Goal Formulation With patient/family  Time For Goal Achievement 10/17/17  Potential to Achieve Goals Good  OT Time Calculation  OT Start Time (ACUTE ONLY) 1323  OT Stop Time (ACUTE ONLY) 1416  OT Time Calculation (min) 53 min   OT General Charges  $OT Visit 1 Visit  OT Evaluation  $OT Eval Moderate Complexity 1 Mod  OT Treatments  $Self Care/Home Management  8-22 mins  Written Expression  Dominant Hand Right  [facility], OT/L  [phone]  10/03/2017 

## 2017-10-03 NOTE — Progress Notes (Signed)
Received patient for shift at 7 pm which she just arrived from ER per report. Patient lethargic not responding on a NRB mask vitals stable and ST on the monitor. Sodium Bicarb connected to patient but not on a pump or running.   Husband at bedside gave an update on recent health of the patient. He and I had a conversation that the patient is a Full Code if anything happens. I called [facility] to assess the patient. Magda Paganini RN came to bedside,we took patient off NRB and sats maintained in 90's without distress. ABG and  narcan was done. Patient did wake up after narcan pulling gown and wires but unable to have conversation or communicate with words just moans. ABG came back abnormal and PA  was notified and PA came to bedside to assess patient. The plan is to get current Sodium Bicarb IVF infusing and repeat ABG.I will continue to follow up with PA Blount about patient. Patient has many complex medical issue beyond the abnormal ABG and will be addressed per MD orders.

## 2017-10-03 NOTE — Progress Notes (Signed)
[facility] Rounding Note  Subjective/Interval History:  Brother says she looks better  Objective Vital signs in last 24 hours: Vitals:   10/03/17 0500 10/03/17 0513 10/03/17 0600 10/03/17 0800  BP: (!) 98/42 (!) 109/50 (!) 117/49 (!) 116/50  Pulse: 92 97 92 92  Resp: 17 16 14 14   Temp:  98.3 F (36.8 C)  98.4 F (36.9 C)  TempSrc:  Axillary  Axillary  SpO2: 92% 95% 94% 95%  Weight:       Weight change:   Intake/Output Summary (Last 24 hours) at 10/03/2017 0921 Last data filed at 10/03/2017 0835 Gross per 24 hour  Intake 2612 ml  Output 1100 ml  Net 1512 ml   Physical Exam:  Blood pressure (!) 116/50, pulse 92, temperature 98.4 F (36.9 C), temperature source Axillary, resp. rate 14, weight 89.7 kg (197 lb 12 oz), SpO2 95 %.   Still VERY lethargic, responds but I can't tell what she is saying BP a little soft Mouth breathing, dry MM's Coarse BS but lungs clear [id number] No S3 1/6 systolic murmur no diastolic murmur Ostomy in place No LE edema I did not turn over to look at PCN sites.   Recent Labs  Lab 10/02/17 0729 10/03/17 0253  NA 136 141  K 4.0 3.6  CL 109 112*  CO2 14* 16*  GLUCOSE 123* 155*  BUN 52* 52*  CREATININE 3.74* 3.27*  CALCIUM 9.2 8.5*  PHOS  --  5.3*    Recent Labs  Lab 10/02/17 0729 10/03/17 0253  AST 15  --   ALT 20  --   ALKPHOS 248*  --   BILITOT 0.4  --   PROT 7.7  --   ALBUMIN 1.9* 1.5*    Recent Labs  Lab 10/02/17 0729 10/03/17 0253  WBC 2.9* 4.5  NEUTROABS 1.5*  --   HGB 9.9* 8.3*  HCT 32.2* 27.2*  MCV 94.7 92.5  PLT 387 332    Recent Labs  Lab 10/02/17 2221 10/03/17 0253  CKTOTAL 16* 18*   CBG: Recent Labs  Lab 10/02/17 0653 10/02/17 2043 10/03/17 0834  GLUCAP 100* 141* 119*   Urine culture pending Blood cultures pending  Studies/Results: Ct Abdomen Pelvis Wo Contrast  Result Date: 10/02/2017 CLINICAL DATA:  Pt has sepsis Had recent enterococcus wound infection and also serratia UTI EXAM: CT ABDOMEN AND PELVIS  WITHOUT CONTRAST TECHNIQUE: Multidetector CT imaging of the abdomen and pelvis was performed following the standard protocol without IV contrast. COMPARISON:  09/22/2017 FINDINGS: Lower chest: There is patchy opacity in the lower lobes at the lung bases, right greater than left. This is new since the prior CT. This may all be atelectasis. Pneumonia should be considered if there are consistent clinical findings. No pulmonary edema. No pleural effusion. Hepatobiliary: Normal liver. Gallbladder surgically absent. Mild prominence of common bile duct with normal distal tapering. Pancreas: No pancreatic mass or inflammation. Mild pancreatic atrophy. Spleen: Normal in size without focal abnormality. Adrenals/Urinary Tract: 17 mm left adrenal mass, stable from the prior CT. Left kidney is swollen with a poorly defined collecting system that appears at least mildly dilated. There is a nonobstructing stone in the lower pole. Right ureter is mildly dilated. It is not well visualized distally. No convincing ureteral stone. A nephrostomy catheter lies within the right kidney. No significant right hydronephrosis. Right ureter is not well followed distally. No convincing ureteral stone. No renal masses. Bladder is decompressed with a Foley catheter. Nephrostomy tube on the left has been  removed since the prior CT. Stomach/Bowel: No bowel obstruction or convincing inflammation. Right mid abdomen colostomy. Stomach is unremarkable. Vascular/Lymphatic: There are prominent retroperitoneal lymph nodes, none pathologically enlarged, all stable from the prior CT. No vascular abnormality. Reproductive: Uterus is surgically absent.  No discrete pelvic mass. Other: The presacral collection noted on the prior CT dated 09/15/2017, is no longer discretely seen. There is still soft tissue attenuation in this location adjacent to surgical vascular clips. The left posterior pelvic pigtail catheter has been removed since the prior exam. No  significant ascites. Musculoskeletal: No fracture or acute finding. No osteoblastic or osteolytic lesions. IMPRESSION: 1. Since the prior CT, the left posterior pelvic pigtail catheter has been removed. This catheter had withdrawn into the gluteus maximus from the presacral collection seen on the CT dated 09/15/2017. 2. The presacral fluid collection, where the pigtail catheter had originally been placed, is no longer discretely visualized, although there is still soft tissue attenuation in the presacral space. The residual soft tissue attenuation may be postsurgical scarring/change. No definite residual or recurrent abscess. 3. There are new lower lobe airspace opacities at the lung bases, which may all be atelectasis. Pneumonia should be suspected if there are consistent clinical findings. 4. Left nephrostomy tube has been removed since the prior CT. Left kidney appears swollen with at least mild hydronephrosis. The proximal left ureter is mildly distended. The distal ureter is not well-defined. 5. No change in the appearance of the right kidney with stable positioning of the right nephrostomy tube. 6. Changes from bowel surgery and formation of the right mid to upper quadrant colostomy are stable. Electronically Signed   By: Lajean Manes M.D.   On: 10/02/2017 10:13   Ct Head Wo Contrast  Result Date: 10/02/2017 CLINICAL DATA:  Altered mental status.  Nonverbal. EXAM: CT HEAD WITHOUT CONTRAST TECHNIQUE: Contiguous axial images were obtained from the base of the skull through the vertex without intravenous contrast. COMPARISON:  None. FINDINGS: Brain: No evidence of acute infarction, hemorrhage, hydrocephalus, extra-axial collection or mass lesion/mass effect. Vascular: No hyperdense vessel or unexpected calcification. Skull: Normal. Negative for fracture or focal lesion. Sinuses/Orbits: Globes and orbits are unremarkable. There is a small amount of dependent fluid in the right sphenoid sinus. Sinuses otherwise  clear. Clear mastoid air cells. Other: None. IMPRESSION: No intracranial abnormality. Electronically Signed   By: Lajean Manes M.D.   On: 10/02/2017 12:04   Dg Chest Portable 1 View  Result Date: 10/02/2017 CLINICAL DATA:  Lethargy, dyspnea EXAM: PORTABLE CHEST 1 VIEW COMPARISON:  08/31/2017 FINDINGS: Lungs are clear.  No pleural effusion or pneumothorax. The heart is normal in size. IMPRESSION: No evidence of acute cardiopulmonary disease. Electronically Signed   By: Julian Hy M.D.   On: 10/02/2017 08:12   Ir Nephrostomy Placement Left  Result Date: 10/02/2017 INDICATION: History of endometrial cancer complicated by development of bilateral ureteral obstruction, post bilateral percutaneous nephrostomy catheter placement on 09/02/2017. Unfortunately, the patient has now been admitted with interval removal of left-sided nephrostomy catheter, as well as findings worrisome for impending urosepsis with markedly cloudy urine within the right nephrostomy. As such, request made for image guided replacement of left-sided nephrostomy catheter as well as right-sided nephrostomy catheter exchange and up sizing. EXAM: 1. FLUOROSCOPIC GUIDED REPLACEMENT OF LEFT-SIDED NEPHROSTOMY CATHETER 2. FLUOROSCOPIC GUIDED EXCHANGE OF RIGHT-SIDED NEPHROSTOMY CATHETER EXCHANGE COMPARISON:  CT abdomen and pelvis - 10/02/2017; 09/22/2017; ultrasound and fluoroscopic guided bilateral percutaneous nephrostomy catheter placement - 09/02/2017 CONTRAST:  A total of 20  mL Isovue-300 administered was administered into both collecting systems FLUOROSCOPY TIME:  4 minute 18 seconds (88 mGy) COMPLICATIONS: None immediate. TECHNIQUE: Informed written consent was obtained from the the patient's family after a discussion of the risks, benefits and alternatives to treatment. Questions regarding the procedure were encouraged and answered. A timeout was performed prior to the initiation of the procedure. The bilateral flanks and external  portions of the existing right-sided percutaneous nephrostomy catheter and entrance site of inadvertently removed left-sided nephrostomy catheter were prepped and draped in the usual sterile fashion. A sterile drape was applied covering the operative field. Maximum barrier sterile technique with sterile gowns and gloves were used for the procedure. A timeout was performed prior to the initiation of the procedure. A pre procedural spot fluoroscopic image was obtained. The entrance site of the inadvertently removed left-sided nephrostomy catheter was cannulated with a Christmas tree adapter. Contrast injection was performed. With the use of a regular glidewire, a Kumpe catheter was advanced through the subcutaneous tract to the level of the left renal pelvis. Contrast injection confirmed appropriate positioning. Next, over a short Amplatz wire, a new 53 French percutaneous drainage catheter was advanced through the existing nephrostomy catheter track with end ultimately coiled and locked within the left renal pelvis. Contrast injection confirmed appropriate positioning. A sample of urine was aspirated, capped and sent to the laboratory for analysis. Attention was now paid towards the exchange and up sizing of the right-sided nephrostomy catheter. Contrast injection confirmed appropriate positioning and functionality of existing right-sided nephrostomy catheter. A small amount of urine was aspirated from the existing right-sided nephrostomy catheter, capped and sent to the laboratory for analysis. Next, the external portion of the nephrostomy catheter was cut and cannulated with a short Amplatz wire which was coiled within the right renal pelvis. Under intermittent fluoroscopic guidance, the existing 10 French nephrostomy catheter was exchanged for a new, slightly larger now 12 French nephrostomy catheter with end ultimately coiled and locked within the right renal pelvis. Contrast injection confirmed appropriate  positioning. Both nephrostomy catheters were secured at the skin entrance site with 2 interrupted sutures and StatLock devices. Both nephrostomy catheters were reconnected to gravity bags. Dressings were placed. The patient tolerated the above procedures well without immediate postprocedural complication. FINDINGS: Subcutaneous tract injection demonstrated persistent patency of the left-sided percutaneous nephrostomy catheter tract. Successful fluoroscopic guided recanalization of the left-sided nephrostomy catheter track allowing placement of a new 12 French percutaneous drainage catheter with end coiled end locked within the left renal pelvis. Successful fluoroscopic guided exchange and up sizing of new now 77 French right-sided percutaneous nephrostomy catheter with end coiled and locked within the right renal pelvis. IMPRESSION: 1. Successful fluoroscopic guided recanalization of the left-sided nephrostomy catheter track allowing placement of a new 12 French percutaneous drainage catheter with and coiled end locked within the left renal pelvis. 2. Successful fluoroscopic guided exchange and up sizing of new now 26 French right-sided percutaneous nephrostomy catheter with end coiled and locked within the right renal pelvis. 3. Urine sample capped and sent to the laboratory for analysis. Electronically Signed   By: Sandi Mariscal M.D.   On: 10/02/2017 18:29   Ir Nephrostomy Exchange Right  Result Date: 10/02/2017 INDICATION: History of endometrial cancer complicated by development of bilateral ureteral obstruction, post bilateral percutaneous nephrostomy catheter placement on 09/02/2017. Unfortunately, the patient has now been admitted with interval removal of left-sided nephrostomy catheter, as well as findings worrisome for impending urosepsis with markedly cloudy urine within the  right nephrostomy. As such, request made for image guided replacement of left-sided nephrostomy catheter as well as right-sided  nephrostomy catheter exchange and up sizing. EXAM: 1. FLUOROSCOPIC GUIDED REPLACEMENT OF LEFT-SIDED NEPHROSTOMY CATHETER 2. FLUOROSCOPIC GUIDED EXCHANGE OF RIGHT-SIDED NEPHROSTOMY CATHETER EXCHANGE COMPARISON:  CT abdomen and pelvis - 10/02/2017; 09/22/2017; ultrasound and fluoroscopic guided bilateral percutaneous nephrostomy catheter placement - 09/02/2017 CONTRAST:  A total of 20 mL Isovue-300 administered was administered into both collecting systems FLUOROSCOPY TIME:  4 minute 18 seconds (88 mGy) COMPLICATIONS: None immediate. TECHNIQUE: Informed written consent was obtained from the the patient's family after a discussion of the risks, benefits and alternatives to treatment. Questions regarding the procedure were encouraged and answered. A timeout was performed prior to the initiation of the procedure. The bilateral flanks and external portions of the existing right-sided percutaneous nephrostomy catheter and entrance site of inadvertently removed left-sided nephrostomy catheter were prepped and draped in the usual sterile fashion. A sterile drape was applied covering the operative field. Maximum barrier sterile technique with sterile gowns and gloves were used for the procedure. A timeout was performed prior to the initiation of the procedure. A pre procedural spot fluoroscopic image was obtained. The entrance site of the inadvertently removed left-sided nephrostomy catheter was cannulated with a Christmas tree adapter. Contrast injection was performed. With the use of a regular glidewire, a Kumpe catheter was advanced through the subcutaneous tract to the level of the left renal pelvis. Contrast injection confirmed appropriate positioning. Next, over a short Amplatz wire, a new 58 French percutaneous drainage catheter was advanced through the existing nephrostomy catheter track with end ultimately coiled and locked within the left renal pelvis. Contrast injection confirmed appropriate positioning. A sample  of urine was aspirated, capped and sent to the laboratory for analysis. Attention was now paid towards the exchange and up sizing of the right-sided nephrostomy catheter. Contrast injection confirmed appropriate positioning and functionality of existing right-sided nephrostomy catheter. A small amount of urine was aspirated from the existing right-sided nephrostomy catheter, capped and sent to the laboratory for analysis. Next, the external portion of the nephrostomy catheter was cut and cannulated with a short Amplatz wire which was coiled within the right renal pelvis. Under intermittent fluoroscopic guidance, the existing 10 French nephrostomy catheter was exchanged for a new, slightly larger now 12 French nephrostomy catheter with end ultimately coiled and locked within the right renal pelvis. Contrast injection confirmed appropriate positioning. Both nephrostomy catheters were secured at the skin entrance site with 2 interrupted sutures and StatLock devices. Both nephrostomy catheters were reconnected to gravity bags. Dressings were placed. The patient tolerated the above procedures well without immediate postprocedural complication. FINDINGS: Subcutaneous tract injection demonstrated persistent patency of the left-sided percutaneous nephrostomy catheter tract. Successful fluoroscopic guided recanalization of the left-sided nephrostomy catheter track allowing placement of a new 12 French percutaneous drainage catheter with end coiled end locked within the left renal pelvis. Successful fluoroscopic guided exchange and up sizing of new now 51 French right-sided percutaneous nephrostomy catheter with end coiled and locked within the right renal pelvis. IMPRESSION: 1. Successful fluoroscopic guided recanalization of the left-sided nephrostomy catheter track allowing placement of a new 12 French percutaneous drainage catheter with and coiled end locked within the left renal pelvis. 2. Successful fluoroscopic guided  exchange and up sizing of new now 66 French right-sided percutaneous nephrostomy catheter with end coiled and locked within the right renal pelvis. 3. Urine sample capped and sent to the laboratory for analysis. Electronically Signed  By: Sandi Mariscal M.D.   On: 10/02/2017 18:29   Medications: . DAPTOmycin (CUBICIN)  IV    . meropenem (MERREM) IV    .  sodium bicarbonate (isotonic) infusion in sterile water 125 mL/hr at 10/02/17 1634   . artificial tears  1 application Both Eyes QHS  . Chlorhexidine Gluconate Cloth  6 each Topical Q0600  . collagenase  1 application Topical Daily  . DULoxetine  30 mg Oral BID  . enoxaparin (LOVENOX) injection  30 mg Subcutaneous Q24H  . famotidine  20 mg Oral Daily  . ferrous sulfate  325 mg Oral QHS  . insulin aspart  2-12 Units Subcutaneous TID WC  . insulin aspart  7 Units Subcutaneous TID WC  . insulin glargine  25 Units Subcutaneous BID  . levothyroxine  150 mcg Oral QAC breakfast  . mupirocin ointment  1 application Nasal BID  . omega-3 acid ethyl esters  1 g Oral BID  . polyvinyl alcohol  1 drop Both Eyes QHS  . pregabalin  50 mg Oral BID  . prenatal vitamin w/FE, FA  1 tablet Oral Q1200  . psyllium  1 packet Oral BID  . sertraline  75 mg Oral Daily  . sodium chloride flush  3 mL Intravenous Q12H  . sodium chloride flush  5 mL Intracatheter Q8H  . Tbo-Filgrastim  480 mcg Subcutaneous 6 days   Background: 62 y.o. 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 Fluid collection w/ CT guided drain placed on 09-16-17, removed 2/2 malpositioning. Cultures + last adm for  GBS, lactobacillus, serratia and yeast on cipro PTA. Adm 10/03/17 from SNF with fevers, AMS. IN ED L nephrostomy fell out w/purulent urine from site. We were  asked to see for recurrent AKI on CKD creatinine up to 3.7  (most recent creatinine PTA new baseline of 2; baseline prior to 08/2017 admit was around 02 Jun 2017).   Assessment/Recommendations  1. AKI on CKD - most recent (new) baseline creatinine around 2 prior to current admission. Last AKI was early 08/2017 w/creatinine at that time up to 4.28, had bilat perc tubes placed to divert urine from bladder (colovesical fistula), + vol depletion. Current AKI some vol depletion, could not exclude obstruction (L perc replaced after coming out, w/purulent urine from exit site, and R perc tube exchanged 10/02/17) and infection. Creatinine falling some today w/isotonic bicarb fluids.  2. Urosepsis - BC's pending. ID has changed ATB's to dapto and meropenem.   3. Chronic obstructive uropathy/colovesical fistula - L PCN replaced, R exchanged yesterday.  4. Metabolic acidosis- bicarb drip at 125. CO2 up to 16. 5. Hypomagnesemia - 1.1 today. Mg infusion ordered 6. Anemia - with prior Fe def (unable to give IV Fe for tsat of 7 last adm due to infection; similar issues now). Hb 8.3. Transfuse prn.  7. Chronic neutropenia - on chronic filagrastin at home.  8. Encephalopathy 9. MRSA PCR +  [name], MD [facility] [phone] Pager 10/03/2017, 9:47 AM

## 2017-10-03 NOTE — Progress Notes (Signed)
PT Cancellation Note  Patient Details Name: [name] MRN: [id number] DOB: 01-23-1956   Cancelled Treatment:    Reason Eval/Treat Not Completed: Patient at procedure or test/unavailable. Will check back later.   [place] 10/03/2017, 11:20 AM Suanne Marker PT [phone]

## 2017-10-04 ENCOUNTER — Other Ambulatory Visit: Payer: Self-pay

## 2017-10-04 DIAGNOSIS — N189 Chronic kidney disease, unspecified: Secondary | ICD-10-CM

## 2017-10-04 DIAGNOSIS — E440 Moderate protein-calorie malnutrition: Secondary | ICD-10-CM

## 2017-10-04 DIAGNOSIS — B9562 Methicillin resistant Staphylococcus aureus infection as the cause of diseases classified elsewhere: Secondary | ICD-10-CM

## 2017-10-04 DIAGNOSIS — Z515 Encounter for palliative care: Secondary | ICD-10-CM

## 2017-10-04 DIAGNOSIS — L89152 Pressure ulcer of sacral region, stage 2: Secondary | ICD-10-CM

## 2017-10-04 DIAGNOSIS — X58XXXA Exposure to other specified factors, initial encounter: Secondary | ICD-10-CM

## 2017-10-04 DIAGNOSIS — Z7189 Other specified counseling: Secondary | ICD-10-CM

## 2017-10-04 DIAGNOSIS — Z0000 Encounter for general adult medical examination without abnormal findings: Secondary | ICD-10-CM

## 2017-10-04 DIAGNOSIS — T148XXA Other injury of unspecified body region, initial encounter: Secondary | ICD-10-CM

## 2017-10-04 DIAGNOSIS — R4182 Altered mental status, unspecified: Secondary | ICD-10-CM

## 2017-10-04 DIAGNOSIS — T8579XA Infection and inflammatory reaction due to other internal prosthetic devices, implants and grafts, initial encounter: Secondary | ICD-10-CM

## 2017-10-04 LAB — CBC
HEMATOCRIT: 23.6 % — AB (ref 36.0–46.0)
Hemoglobin: 7.3 g/dL — ABNORMAL LOW (ref 12.0–15.0)
MCH: 27.9 pg (ref 26.0–34.0)
MCHC: 30.9 g/dL (ref 30.0–36.0)
MCV: 90.1 fL (ref 78.0–100.0)
PLATELETS: 286 10*3/uL (ref 150–400)
RBC: 2.62 MIL/uL — ABNORMAL LOW (ref 3.87–5.11)
RDW: 16.2 % — AB (ref 11.5–15.5)
WBC: 3.2 10*3/uL — AB (ref 4.0–10.5)

## 2017-10-04 LAB — URINE CULTURE
Culture: 20000 — AB
Culture: >=100000 — AB
Special Requests: NORMAL

## 2017-10-04 LAB — MAGNESIUM: MAGNESIUM: 1.7 mg/dL (ref 1.7–2.4)

## 2017-10-04 LAB — GLUCOSE, CAPILLARY
GLUCOSE-CAPILLARY: 100 mg/dL — AB (ref 65–99)
GLUCOSE-CAPILLARY: 135 mg/dL — AB (ref 65–99)
Glucose-Capillary: 72 mg/dL (ref 65–99)
Glucose-Capillary: 248 mg/dL — ABNORMAL HIGH (ref 65–99)

## 2017-10-04 LAB — COMPREHENSIVE METABOLIC PANEL
ALBUMIN: 1.5 g/dL — AB (ref 3.5–5.0)
ALT: 19 U/L (ref 14–54)
AST: 18 U/L (ref 15–41)
Alkaline Phosphatase: 226 U/L — ABNORMAL HIGH (ref 38–126)
Anion gap: 12 (ref 5–15)
BUN: 49 mg/dL — ABNORMAL HIGH (ref 6–20)
CHLORIDE: 106 mmol/L (ref 101–111)
CO2: 27 mmol/L (ref 22–32)
Calcium: 8.1 mg/dL — ABNORMAL LOW (ref 8.9–10.3)
Creatinine, Ser: 2.78 mg/dL — ABNORMAL HIGH (ref 0.44–1.00)
GFR calc Af Amer: 20 mL/min — ABNORMAL LOW (ref >60)
GFR calc non Af Amer: 17 mL/min — ABNORMAL LOW (ref >60)
GLUCOSE: 83 mg/dL (ref 65–99)
POTASSIUM: 2.4 mmol/L — AB (ref 3.5–5.1)
SODIUM: 145 mmol/L (ref 135–145)
Total Bilirubin: 0.7 mg/dL (ref 0.3–1.2)
Total Protein: 5.6 g/dL — ABNORMAL LOW (ref 6.5–8.1)

## 2017-10-04 LAB — ABO/RH: ABO/RH(D): A POS

## 2017-10-04 LAB — PREPARE RBC (CROSSMATCH)

## 2017-10-04 LAB — PHOSPHORUS: Phosphorus: 3.6 mg/dL (ref 2.5–4.6)

## 2017-10-04 MED ORDER — POTASSIUM CHLORIDE CRYS ER 20 MEQ PO TBCR: 40.0000 meq | EXTENDED_RELEASE_TABLET | Freq: Two times a day (BID) | ORAL | Status: DC

## 2017-10-04 MED ORDER — PRO-STAT SUGAR FREE PO LIQD
30.0000 mL | Freq: Two times a day (BID) | ORAL | Status: DC
  Filled 2017-10-04 (×3): qty 30

## 2017-10-04 MED ORDER — INSULIN ASPART 100 UNIT/ML ~~LOC~~ SOLN
0.0000 [IU] | Freq: Three times a day (TID) | SUBCUTANEOUS | Status: DC
  Administered 2017-10-04: 3 [IU] via SUBCUTANEOUS
  Administered 2017-10-05 (×2): 1 [IU] via SUBCUTANEOUS
  Administered 2017-10-06: 2 [IU] via SUBCUTANEOUS
  Administered 2017-10-06: 1 [IU] via SUBCUTANEOUS
  Administered 2017-10-07: 2 [IU] via SUBCUTANEOUS
  Administered 2017-10-09 – 2017-10-11 (×3): 1 [IU] via SUBCUTANEOUS
  Administered 2017-10-12: 2 [IU] via SUBCUTANEOUS

## 2017-10-04 MED ORDER — PREMIER PROTEIN SHAKE
11.0000 [oz_av] | Freq: Two times a day (BID) | ORAL | Status: DC
  Administered 2017-10-05 – 2017-10-06 (×2): 11 [oz_av] via ORAL
  Filled 2017-10-04 (×8): qty 325.31

## 2017-10-04 MED ORDER — ORAL CARE MOUTH RINSE
15.0000 mL | Freq: Two times a day (BID) | OROMUCOSAL | Status: DC
Start: 2017-10-04 — End: 2017-10-06
  Administered 2017-10-04: 15 mL via OROMUCOSAL

## 2017-10-04 MED ORDER — SODIUM CHLORIDE 0.9 % IV SOLN
Freq: Once | INTRAVENOUS | Status: AC
  Administered 2017-10-04: 12:00:00 via INTRAVENOUS

## 2017-10-04 MED ORDER — POTASSIUM CHLORIDE CRYS ER 20 MEQ PO TBCR
40.0000 meq | EXTENDED_RELEASE_TABLET | ORAL | Status: AC
  Administered 2017-10-04 (×2): 40 meq via ORAL
  Filled 2017-10-04 (×2): qty 2

## 2017-10-04 MED ORDER — TBO-FILGRASTIM 480 MCG/0.8ML ~~LOC~~ SOSY
480.0000 ug | PREFILLED_SYRINGE | SUBCUTANEOUS | Status: DC
  Administered 2017-10-04 – 2017-10-10 (×2): 480 ug via SUBCUTANEOUS
  Filled 2017-10-04 (×2): qty 0.8

## 2017-10-04 MED ORDER — INSULIN ASPART 100 UNIT/ML ~~LOC~~ SOLN: 0.0000 [IU] | Freq: Three times a day (TID) | SUBCUTANEOUS | Status: DC

## 2017-10-04 MED ORDER — SODIUM CHLORIDE 0.9 % IV SOLN
INTRAVENOUS | Status: DC
  Administered 2017-10-04 – 2017-10-06 (×3): via INTRAVENOUS
  Filled 2017-10-04 (×2): qty 1000

## 2017-10-04 MED ORDER — CHLORHEXIDINE GLUCONATE 0.12 % MT SOLN
15.0000 mL | Freq: Two times a day (BID) | OROMUCOSAL | Status: DC
  Administered 2017-10-04 – 2017-10-06 (×5): 15 mL via OROMUCOSAL
  Filled 2017-10-04 (×5): qty 15

## 2017-10-04 MED ORDER — POTASSIUM CHLORIDE 10 MEQ/50ML IV SOLN
10.0000 meq | INTRAVENOUS | Status: AC
  Administered 2017-10-04 (×4): 10 meq via INTRAVENOUS
  Filled 2017-10-04 (×4): qty 50

## 2017-10-04 MED ORDER — INSULIN ASPART 100 UNIT/ML ~~LOC~~ SOLN: 0.0000 [IU] | SUBCUTANEOUS | Status: DC

## 2017-10-04 NOTE — Evaluation (Signed)
Clinical/Bedside Swallow Evaluation Patient Details  Name: [name] MRN: [id number] Date of Birth: 05/24/1956  Today's Date: 10/04/2017 Time: SLP Start Time (ACUTE ONLY): 0931 SLP Stop Time (ACUTE ONLY): 0947 SLP Time Calculation (min) (ACUTE ONLY): 16 min  Past Medical History:  Past Medical History:  Diagnosis Date  . Adrenal adenoma, left 02/08/2016   CT: stable benign  . Anemia in neoplastic disease   . Benign essential HTN   . 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 Cancer of corpus uteri, except isthmus [facility])  oncologist-- dr Denman [name] and dr Alvy Bimler    10-15-2004  dx endometroid endometrial and ovarian cancer s/p  chemotheapy and surgery(TAH w/ BSO) :  recurrent 11-19-2014 post pelvic surgery and radiation 01-29-2015 to 03-10-2015  . Chronic idiopathic neutropenia (HCC)    presumed related to chemotherapy March 2006--- followed by dr Alvy Bimler (treatment w/ G-CSF injections  . Chronic nausea   . Chronic pain    perineal/ anal  area from bladder pad irritates skin , right flank pain  . CKD stage G2/A3, GFR 60-89 and albumin creatinine ratio >300 mg/g    nephrologist-  dr Madelon Lips  . Diabetic retinopathy, background ([place])   . Difficult intravenous access    small veins--- hx PICC lines  . DM type 2 (diabetes mellitus, type 2) ([place])    monitored by dr Legrand Como altheimer  . Dysuria   . Environmental and seasonal allergies   . Fatty liver 02/08/2016   CT  . Generalized muscle weakness   . GERD (gastroesophageal reflux disease)   . Hiatal hernia   . History of abdominal abscess 04/16/2017   post surgery 04-01-2017  --- resolved 10/ 2018  . History of gastric polyp    2014  duodenum  . History of ileus 04/16/2017   resolved w/ no surgical intervention  . History of radiation therapy     01-29-2015 to 03-10-2015  pelvis 50.4Gy  . Hypothyroidism    monitored by dr Legrand Como altheimer  . Ileostomy in place [facility]) 04/01/2017   created at same time colostomy takedown.  . Lower urinary tract symptoms (LUTS)    urge urinary  incontinence  . Mixed dyslipidemia   . Multiple thyroid nodules    Managed by Dr. Harlow Asa  . Pelvic abscess in female 04/16/2017  . PONV (postoperative nausea and vomiting)    "scopolamine patch works for me"  . Radiation-induced dermatitis    contact dermatitis , radiation completed, rash only on ankles now.  . Seasonal allergies   . Ureteral stricture, right UROLOGIT-  DR [facility]   CHRONIC--  TREATMENT URETERAL STENT  . 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 Vitamin D deficiency   . Wears glasses    Past Surgical History:  Past Surgical History:  Procedure Laterality Date  . APPENDECTOMY    . BREAST RECONSTRUCTION WITH PLACEMENT OF TISSUE EXPANDER AND FLEX HD (ACELLULAR HYDRATED DERMIS) Bilateral 12/11/2015   Procedure: BILATERAL BREAST RECONSTRUCTION WITH PLACEMENT OF TISSUE EXPANDERS;  Surgeon: Irene Limbo, MD;  Location: [place];  Service: Plastics;  Laterality: Bilateral;  . COLONOSCOPY WITH PROPOFOL N/A 08/21/2013   Procedure: COLONOSCOPY WITH PROPOFOL;  Surgeon: Cleotis Nipper, MD;  [facility];  Service: Endoscopy;  Laterality: N/A;  . COLOSTOMY TAKEDOWN N/A 12/04/2014   Procedure: LAPROSCOPIC LYSIS OF ADHESIONS, SPLENIC  MOBILIZATION, RELOCATION OF COLOSTOMY, DEBRIDEMENT INITIAL COLOSTOMY SITE;  Surgeon: Michael [name], MD;  [facility]: [facility];  Service: General;  Laterality: N/A;  . CYSTOGRAM N/A 06/01/2017   Procedure: CYSTOGRAM;  Surgeon: Alexis Frock, MD;  [facility];  Service: Urology;  Laterality: N/A;  . CYSTOSCOPY W/ RETROGRADES Right 11/21/2015   Procedure: CYSTOSCOPY WITH RETROGRADE PYELOGRAM;  Surgeon: Alexis Frock, MD;  [facility];  Service: Urology;  Laterality: Right;  . CYSTOSCOPY W/ URETERAL STENT  PLACEMENT Right 11/21/2015   Procedure: CYSTOSCOPY WITH STENT REPLACEMENT;  Surgeon: Alexis Frock, MD;  [facility];  Service: Urology;  Laterality: Right;  . CYSTOSCOPY W/ URETERAL STENT PLACEMENT Right 03/10/2016   Procedure: CYSTOSCOPY WITH STENT REPLACEMENT;  Surgeon: Alexis Frock, MD;  Location: [facility];  Service: Urology;  Laterality: Right;  . CYSTOSCOPY W/ URETERAL STENT PLACEMENT Right 06/30/2016   Procedure: CYSTOSCOPY WITH RETROGRADE PYELOGRAM/URETERAL STENT EXCHANGE;  Surgeon: Alexis Frock, MD;  Location: [facility];  Service: Urology;  Laterality: Right;  . CYSTOSCOPY W/ URETERAL STENT PLACEMENT N/A 06/01/2017   Procedure: CYSTOSCOPY WITH EXAM UNDER ANESTHESIA;  Surgeon: Alexis Frock, MD;  [facility];  Service: Urology;  Laterality: N/A;  . CYSTOSCOPY W/ URETERAL STENT PLACEMENT Right 08/17/2017   Procedure: CYSTOSCOPY WITH RETROGRADE PYELOGRAM/URETERAL STENT REMOVAL;  Surgeon: Alexis Frock, MD;  Location: [facility];  Service: Urology;  Laterality: Right;  . CYSTOSCOPY WITH RETROGRADE PYELOGRAM, URETEROSCOPY AND STENT PLACEMENT Right 03/20/2015   Procedure: CYSTOSCOPY WITH RETROGRADE PYELOGRAM, URETEROSCOPY WITH BALLOON DILATION AND STENT PLACEMENT ON RIGHT;  Surgeon: Alexis Frock, MD;  Location: [facility];  Service: Urology;  Laterality: Right;  . CYSTOSCOPY WITH RETROGRADE PYELOGRAM, URETEROSCOPY AND STENT PLACEMENT Right 05/02/2015   Procedure: CYSTOSCOPY WITH RIGHT RETROGRADE PYELOGRAM,  DIAGNOSTIC URETEROSCOPY AND STENT PULL ;  Surgeon: Alexis Frock, MD;  Location: [facility];  Service: Urology;  Laterality: Right;  . CYSTOSCOPY WITH RETROGRADE PYELOGRAM, URETEROSCOPY AND STENT PLACEMENT Right 09/05/2015   Procedure: CYSTOSCOPY WITH RETROGRADE PYELOGRAM,  AND STENT PLACEMENT;  Surgeon: Alexis Frock, MD;  [facility];  Service: Urology;  Laterality: Right;  . CYSTOSCOPY WITH  RETROGRADE PYELOGRAM, URETEROSCOPY AND STENT PLACEMENT Right 04/01/2017   Procedure: CYSTOSCOPY WITH RETROGRADE PYELOGRAM, URETEROSCOPY AND STENT PLACEMENT;  Surgeon: Alexis Frock, MD;  [facility];  Service: Urology;  Laterality: Right;  . CYSTOSCOPY WITH STENT PLACEMENT Right 10/27/2016   Procedure: CYSTOSCOPY WITH STENT CHANGE and right retrograde pyelogram;  Surgeon: Alexis Frock, MD;  Location: [facility];  Service: Urology;  Laterality: Right;  . EUS N/A 10/02/2014   Procedure: LOWER ENDOSCOPIC ULTRASOUND (EUS);  Surgeon: Arta Silence, MD;  Location: [facility];  Service: Endoscopy;  Laterality: N/A;  . EXCISION SOFT TISSUE MASS RIGHT FOREMAN  12-08-2006  . EYE SURGERY  as child   pytosis of eyelids repair  . INCISION AND DRAINAGE OF WOUND Bilateral 12/26/2015   Procedure: DEBRIDEMENT OF BILATERAL MASTECTOMY FLAPS;  Surgeon: Irene Limbo, MD;  Location: [place];  Service: Plastics;  Laterality: Bilateral;  . IR CV LINE INJECTION  05/31/2017  . IR FLUORO GUIDE CV LINE LEFT  05/31/2017  . IR FLUORO GUIDE CV LINE RIGHT  04/06/2017  . IR FLUORO GUIDE CV MIDLINE PICC RIGHT  05/30/2017  . IR NEPHROSTOGRAM LEFT INITIAL PLACEMENT  09/02/2017  . IR NEPHROSTOGRAM RIGHT INITIAL PLACEMENT  09/02/2017  . IR NEPHROSTOGRAM RIGHT THRU EXISTING ACCESS  09/13/2017  . IR NEPHROSTOMY  EXCHANGE RIGHT  10/02/2017  . IR NEPHROSTOMY PLACEMENT LEFT  10/02/2017  . IR RADIOLOGIST EVAL & MGMT  05/03/2017  . IR US GUIDE VASC ACCESS LEFT  05/31/2017  . IR US GUIDE VASC ACCESS RIGHT  04/06/2017  . IR US GUIDE VASC ACCESS RIGHT  05/30/2017  . LAPAROSCOPIC CHOLECYSTECTOMY  1990  . LIPOSUCTION WITH LIPOFILLING Bilateral 04/16/2016   Procedure: LIPOSUCTION WITH LIPOFILLING TO BILATERAL CHEST;  Surgeon: Irene Limbo, MD;  Location: [place];  Service: Plastics;  Laterality: Bilateral;  . MASTECTOMY W/ SENTINEL NODE BIOPSY Bilateral 12/11/2015   Procedure: RIGHT  PROPHYLACTIC MASTECTOMY, LEFT TOTAL MASTECTOMY WITH LEFT AXILLARY SENTINEL LYMPH NODE BIOPSY;  Surgeon: Stark Klein, MD;  Location: [place];  Service: General;  Laterality: Bilateral;  . OSTOMY N/A 11/19/2014   Procedure: OSTOMY;  Surgeon: Michael [name], MD;  [facility];  Service: General;  Laterality: N/A;  . PROCTOSCOPY N/A 04/01/2017   Procedure: RIDGE PROCTOSCOPY;  Surgeon: Michael [name], MD;  [facility];  Service: General;  Laterality: N/A;  . REMOVAL OF BILATERAL TISSUE EXPANDERS WITH PLACEMENT OF BILATERAL BREAST IMPLANTS Bilateral 04/16/2016   Procedure: REMOVAL OF BILATERAL TISSUE EXPANDERS WITH PLACEMENT OF BILATERAL BREAST IMPLANTS;  Surgeon: Irene Limbo, MD;  Location: [place];  Service: Plastics;  Laterality: Bilateral;  . ROBOTIC ASSISTED LAP VAGINAL HYSTERECTOMY N/A 11/19/2014   Procedure: ROBOTIC LYSIS OF ADHESIONS, CONVERTED TO LAPAROTOMY RADICAL UPPER VAGINECTOMY,LOW ANTERIOR BOWEL RESECTION, COLOSTOMY, BILATERAL URETERAL STENT PLACEMENT AND CYSTONOMY CLOSURE;  Surgeon: Everitt Amber, MD;  [facility];  Service: Gynecology;  Laterality: N/A;  . TISSUE EXPANDER FILLING Bilateral 12/26/2015   Procedure: EXPANSION OF BILATERAL CHEST TISSUE EXPANDERS (60 mL- Right; 75 mL- Left);  Surgeon: Irene Limbo, MD;  Location: [place];  Service: Plastics;  Laterality: Bilateral;  . TONSILLECTOMY    . TOTAL ABDOMINAL HYSTERECTOMY  March 2006   [facility]   and Bilateral Salpingoophorectomy/  staging for Ovarian cancer/  an  . 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 Pt [facility]'d from [facility] on 2/24 after >1 month hospitalization. Pt readmitted to [facility] on 10/02/2017 with pain, delirium and acute renal failure and found to have displacement of lt nephrostomy tube and exchange of rt nephrostomy tube on 3/3. CXR No acute cardiopulmonary disease.   Assessment / Plan / Recommendation Clinical Impression  Pt more alert today and able to participate in swallow assessment. RN administering pills whole in applesauce when SLP arrived. Pt exhibits a cognitively based oropharyngeal dysphagia due to confusion, initial inability problem solve use of straw and oral holding of pill. SLP facilitated transit with additional sips water and pudding to no avail. Subtle throat clears x 2. Do not suspect pharyngeal phase dysphagia at this time. Recommended to nurse to crush pills when able. SLP recommends continuing full liquid diet and will prognosis good for upgrade soon with husband in agreement. Pt's pain is limiting however needs to sit upright as much as possible.  SLP Visit Diagnosis: Dysphagia, oropharyngeal phase (R13.12)    Aspiration Risk  Mild aspiration risk    Diet Recommendation Thin liquid;Other (Comment)(full liquids)   Liquid Administration via: Cup;Straw Medication Administration: Crushed with puree Supervision: Patient able to self feed;Full supervision/cueing for compensatory strategies Compensations: Slow rate;Small sips/bites Postural Changes: Seated upright at 90 degrees    Other  Recommendations Oral Care Recommendations: Oral care BID   Follow up Recommendations (TBD)      Frequency and Duration min 2x/week  2 weeks       Prognosis Prognosis for Safe Diet Advancement: Good      Swallow Study   General HPI: Pt with history of bilateral mastectomies for breast cancer,  chronic neutropenia, h/o ovarian and endometrial CA, h/o colostomy 2016, w/takedown/loop ileostomy diversion as well as resection of ureteral stricture w/ bladder hitch reimplantation all in 03/2017, acute renal failure, 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 Pt [facility]'d from [facility] on 2/24 after >1 month hospitalization. Pt readmitted to [facility] on 10/02/2017 with pain, delirium and acute renal failure and found to have displacement of lt nephrostomy tube and exchange of rt nephrostomy tube on 3/3. CXR No acute cardiopulmonary disease. Type of Study: Bedside Swallow Evaluation Previous Swallow Assessment: none Diet Prior to this Study: Thin liquids;Other (Comment)(full liquids) Temperature Spikes Noted: No Respiratory Status: Room air History of Recent Intubation: No Behavior/Cognition: Alert;Requires cueing;Cooperative Oral Cavity Assessment: Dry Oral Care Completed by SLP: Recent completion by staff Oral Cavity - Dentition: Adequate natural dentition Vision: Functional for self-feeding Self-Feeding Abilities: Able to feed self;Needs assist Patient Positioning: Other (comment)(partially upright due to pain) Baseline Vocal Quality: Normal    Oral/Motor/Sensory Function Overall Oral Motor/Sensory Function: (no focal weakness)   Ice Chips Ice chips: Not tested   Thin Liquid Thin Liquid: Impaired Presentation: Cup;Straw Pharyngeal  Phase Impairments: Throat Clearing - Delayed    Nectar Thick Nectar Thick Liquid: Not tested   Honey Thick Honey Thick Liquid: Not tested   Puree Puree: Impaired Oral Phase Impairments: Reduced lingual movement/coordination Oral Phase Functional Implications: Oral holding(pill)   Solid   GO   Solid: Impaired Oral Phase Functional Implications: Prolonged oral transit        Houston Siren 10/04/2017,10:00 AM   Orbie Pyo Colvin Caroli.Ed [employer] [phone]

## 2017-10-04 NOTE — Progress Notes (Signed)
Nutrition Follow-up  DOCUMENTATION CODES:   Obesity unspecified  INTERVENTION:   -Continue MVI daily -Decrease 30 ml Prostat to BID, each supplement provides 100 kcals and 15 grams protein -Premier Protein BID, each supplement provides 160 kcals and 30 grams protein  NUTRITION DIAGNOSIS:   Increased nutrient needs related to wound healing as evidenced by estimated needs.  Ongoing  GOAL:   Patient will meet greater than or equal to 90% of their needs  Progressing  MONITOR:   PO intake, Supplement acceptance, Labs, Weight trends, Skin, I & O's, Diet advancement  REASON FOR ASSESSMENT:   Consult Poor PO  ASSESSMENT:   62 y.o. female  with past medical history of endometrial cancer and complicated surgical history including hysterectomy, chronic stent placement, colostomy reversal with ureteral construction and subsequent bladder leak with colovesicular fistula who had bilateral nephrostomy tubes placed in February admitted on 10/02/2017 with confusion and lethargy likely subsequent from sepsis related to UTI.   3/3- s/p bilateral PCN replacement and exchange by IR 3/5- s/p BSE- pt much more alert; plan to continue with full liquid diet with plans for advancement 3/6- hydrotherapy initiaited  Reviewed I/O's: +2.7 L x 24 hours and + 4.6 L since admission.   Pt receiving hydrotherapy treatments at time of visit. Per SLP notes, pt is more alert, so suspect intake will improve. Per MAR, pt is accepting Prostat supplements.   Labs reviewed: K: 2.4, CBGS WDL (inpatient orders for glycemic control are 2-12 units insulin aspart TID with meals and 7 units insulin aspart TID with meals).   Diet Order:  Diet full liquid Room service appropriate? Yes; Fluid consistency: Thin  EDUCATION NEEDS:   Not appropriate for education at this time  Skin:  Skin Assessment: Skin Integrity Issues: Skin Integrity Issues:: Stage I, Unstageable Stage I: lt heel Stage II: n/a Unstageable: sacrum  x 3  Last BM:  10/03/17 (200 ml via ileostomy)  Height:   Ht Readings from Last 1 Encounters:  09/05/17 5\' 2"  (1.575 m)    Weight:   Wt Readings from Last 1 Encounters:  10/04/17 201 lb (91.2 kg)    Ideal Body Weight:  50 kg  BMI:  Body mass index is 36.76 kg/m.  Estimated Nutritional Needs:   Kcal:  0300-9233  Protein:  100-115 grams  Fluid:  > 1.7 L    [name], RD, LDN, CDE Pager: [phone] After hours Pager: [phone]

## 2017-10-04 NOTE — Progress Notes (Signed)
Supervising Physician: Arne Cleveland  Patient Status:  [facility] - In-pt  Chief Complaint: Bilateral hydronephrosis  Subjective: Assessed while getting wound care. Husband at bedside.   Allergies: Penicillins; Adhesive [tape]; Cefaclor; Erythromycin; Trimethoprim; Ultram [tramadol]; Fluconazole; Oxycodone; Pectin; and Sulfa antibiotics  Medications: Prior to Admission medications   Medication Sig Start Date End Date Taking? Authorizing Provider  acetaminophen (TYLENOL) 325 MG tablet Take 2 tablets (650 mg total) by mouth every 6 (six) hours as needed. 09/24/17  Yes Roxan Hockey, MD  ALPRAZolam Duanne Moron) 0.5 MG tablet Take 1 tablet (0.5 mg total) by mouth 3 (three) times daily as needed for anxiety. 09/24/17  Yes Emokpae, Courage, MD  anastrozole (ARIMIDEX) 1 MG tablet Take 1 tablet (1 mg total) by mouth daily. 09/24/17  Yes Roxan Hockey, MD  artificial tears (LACRILUBE) OINT ophthalmic ointment Place into both eyes at bedtime. Patient taking differently: Place 1 application into both eyes at bedtime.  09/24/17  Yes Emokpae, Courage, MD  Biotin 5 MG TABS Take 5 mg by mouth every morning.    Yes [provider]  Cholecalciferol (VITAMIN D3) 10000 UNITS capsule Take 10,000 Units by mouth once a week. Sunday evening's   Yes [provider]  collagenase (SANTYL) ointment Apply 1 application topically daily.   Yes [provider]  diphenhydrAMINE (BENADRYL) 25 mg capsule Take 1 capsule (25 mg total) by mouth every 8 (eight) hours as needed for itching, allergies or sleep. 09/24/17  Yes Emokpae, Courage, MD  diphenoxylate-atropine (LOMOTIL) 2.5-0.025 MG tablet Take 1 tablet by mouth 4 (four) times daily as needed for diarrhea or loose stools. TO PREVENT LOOSE BOWEL MOVEMENTS 09/24/17  Yes Emokpae, Courage, MD  DULoxetine (CYMBALTA) 30 MG capsule Take 1 capsule (30 mg total) by mouth 2 (two) times daily. 09/24/17  Yes Emokpae, Courage, MD  famotidine (PEPCID) 20 MG  tablet Take 20 mg by mouth daily as needed for heartburn or indigestion.   Yes [provider]  fentaNYL (DURAGESIC - DOSED MCG/HR) 50 MCG/HR Place 50 mcg onto the skin every 3 (three) days.   Yes [provider]  ferrous sulfate 325 (65 FE) MG tablet Take 1 tablet (325 mg total) by mouth at bedtime. 09/24/17  Yes Roxan Hockey, MD  filgrastim (NEUPOGEN) 480 MCG/1.6ML injection Inject 480 mcg/1.6 ml under the skin every 6 days for life (last dose 09/22/17) 09/24/17  Yes Emokpae, Courage, MD  HYDROmorphone (DILAUDID) 4 MG tablet Take 1.5 tablets (6 mg total) by mouth every 3 (three) hours as needed for moderate pain or severe pain. 09/24/17  Yes Emokpae, Courage, MD  insulin aspart (NOVOLOG) 100 UNIT/ML injection Inject 0-20 Units into the skin 3 (three) times daily with meals. Patient taking differently: Inject 2-12 Units into the skin See admin instructions. Per sliding scale; if BS <60, call MD. If BS 201-250=2 units, 251-300=4 units, 301-350=6 units, 351-400=8 units, 401-450=10 units, BS>450=12 units 09/24/17  Yes Emokpae, Courage, MD  insulin aspart (NOVOLOG) 100 UNIT/ML injection Inject 7 Units into the skin 3 (three) times daily with meals. 09/24/17  Yes Emokpae, Courage, MD  insulin glargine (LANTUS) 100 UNIT/ML injection Inject 0.25 mLs (25 Units total) into the skin 2 (two) times daily. 09/24/17  Yes Roxan Hockey, MD  levothyroxine (SYNTHROID, LEVOTHROID) 150 MCG tablet Take 1 tablet (150 mcg total) by mouth daily before breakfast. 09/25/17  Yes Emokpae, Courage, MD  loperamide (IMODIUM) 2 MG capsule Take 1-2 capsules (2-4 mg total) by mouth every 8 (eight) hours as needed  for diarrhea or loose stools (Use if >2 BM every 8 hours). 09/24/17  Yes Emokpae, Courage, MD  loratadine (CLARITIN) 10 MG tablet Take 10 mg by mouth every morning.    Yes [provider]  methocarbamol (ROBAXIN) 750 MG tablet Take 1 tablet (750 mg total) by mouth 3 (three) times daily. Patient taking  differently: Take 750 mg by mouth 3 (three) times daily as needed for muscle spasms.  09/24/17  Yes Emokpae, Courage, MD  omega-3 acid ethyl esters (LOVAZA) 1 G capsule Take 1 g by mouth 2 (two) times daily.    Yes [provider]  ondansetron (ZOFRAN-ODT) 4 MG disintegrating tablet Take 4 mg by mouth every 6 (six) hours as needed for nausea or vomiting.  04/12/17  Yes [provider]  polyvinyl alcohol (LIQUIFILM TEARS) 1.4 % ophthalmic solution Place 1 drop into both eyes at bedtime. 09/24/17  Yes Emokpae, Courage, MD  pregabalin (LYRICA) 50 MG capsule Take 50 mg by mouth 2 (two) times daily.   Yes [provider]  Prenatal Vit-Fe Fumarate-FA (PRENATAL VITAMIN PO) Take 1 capsule by mouth daily. Takes prenatal because there are no dyes in it   Yes [provider]  prochlorperazine (COMPAZINE) 10 MG tablet Take 1 tablet (10 mg total) by mouth 2 (two) times daily as needed for nausea or vomiting. Patient taking differently: Take 10 mg by mouth 2 (two) times daily as needed for nausea or vomiting.  05/28/17  Yes Virgel Manifold, MD  promethazine (PHENERGAN) 25 MG tablet Take 1 tablet (25 mg total) by mouth every 6 (six) hours as needed for nausea or vomiting. Patient taking differently: Take 25 mg by mouth 4 (four) times daily as needed for nausea or vomiting.  05/28/17  Yes Virgel Manifold, MD  protein supplement shake (PREMIER PROTEIN) LIQD Take 325 mLs (11 oz total) by mouth 2 (two) times daily as needed (Pt to request). 09/24/17  Yes Emokpae, Courage, MD  psyllium (HYDROCIL/METAMUCIL) 95 % PACK Take 1 packet by mouth 2 (two) times daily. 09/24/17  Yes Emokpae, Courage, MD  ranitidine (ZANTAC) 150 MG tablet Take 1 tablet (150 mg total) by mouth 2 (two) times daily as needed for heartburn. 09/24/17  Yes Emokpae, Courage, MD  rosuvastatin (CRESTOR) 10 MG tablet Take 10 mg by mouth every evening.    Yes [provider]  sertraline (ZOLOFT) 50 MG tablet Take 1.5  tablets (75 mg total) by mouth daily. 09/24/17  Yes Emokpae, Courage, MD  sitaGLIPtin (JANUVIA) 100 MG tablet Take 100 mg by mouth every morning.    Yes [provider]  traZODone (DESYREL) 100 MG tablet Take 1 tablet (100 mg total) by mouth at bedtime as needed for sleep. 09/24/17  Yes Emokpae, Courage, MD  tuberculin (TUBERSOL) 5 UNIT/0.1ML injection Inject 5 Units into the skin every 14 (fourteen) days.   Yes [provider]  ciprofloxacin (CIPRO) 500 MG tablet Take 1 tablet (500 mg total) by mouth 2 (two) times daily. Patient not taking: Reported on 10/02/2017 09/24/17   Roxan Hockey, MD  famotidine-calcium carbonate-magnesium hydroxide (PEPCID COMPLETE) 10-800-165 MG chewable tablet Chew 1 tablet by mouth daily as needed (INDIGESTION). Patient not taking: Reported on 10/02/2017 09/24/17   Roxan Hockey, MD  fentaNYL (DURAGESIC - DOSED MCG/HR) 75 MCG/HR Place 1 patch (75 mcg total) onto the skin every 3 (three) days. Patient not taking: Reported on 10/02/2017 09/25/17   Roxan Hockey, MD  Hydrocortisone (GERHARDT'S BUTT CREAM) CREA Apply 1 application topically 4 (four)  times daily. Patient not taking: Reported on 10/02/2017 09/24/17   Roxan Hockey, MD  lidocaine (LIDODERM) 5 % Place 1 patch onto the skin daily. Remove & Discard patch within 12 hours Patient not taking: Reported on 10/02/2017 09/25/17   Roxan Hockey, MD  lip balm (CARMEX) ointment Apply 1 application topically 2 (two) times daily. Patient not taking: Reported on 10/02/2017 09/24/17   Roxan Hockey, MD  Polyethyl Glycol-Propyl Glycol (SYSTANE OP) Place 1 drop into both eyes at bedtime.     [provider]  pregabalin (LYRICA) 75 MG capsule Take 1 capsule (75 mg total) by mouth 2 (two) times daily. Patient not taking: Reported on 10/02/2017 09/24/17   Roxan Hockey, MD     Vital Signs: BP (!) 115/49 (BP Location: Right Arm)   Pulse 93   Temp 97.6 F (36.4 C) (Oral)   Resp 12   Wt 201 lb (91.2  kg)   SpO2 95%   BMI 36.76 kg/m   Physical Exam  Constitutional: She is oriented to person, place, and time. She appears well-developed.  Cardiovascular: Normal rate, regular rhythm and normal heart sounds.  Pulmonary/Chest: Effort normal and breath sounds normal. No respiratory distress.  Abdominal: Soft.  Neurological: She is alert and oriented to person, place, and time.  Skin: Skin is warm and dry.  Bilateral PCNs in place.  L tube with some old drainage around insertion site.  R insertion site c/d/i.   Nursing note and vitals reviewed.   Imaging: Ct Abdomen Pelvis Wo Contrast  Result Date: 10/02/2017 CLINICAL DATA:  Pt has sepsis Had recent enterococcus wound infection and also serratia UTI EXAM: CT ABDOMEN AND PELVIS WITHOUT CONTRAST TECHNIQUE: Multidetector CT imaging of the abdomen and pelvis was performed following the standard protocol without IV contrast. COMPARISON:  09/22/2017 FINDINGS: Lower chest: There is patchy opacity in the lower lobes at the lung bases, right greater than left. This is new since the prior CT. This may all be atelectasis. Pneumonia should be considered if there are consistent clinical findings. No pulmonary edema. No pleural effusion. Hepatobiliary: Normal liver. Gallbladder surgically absent. Mild prominence of common bile duct with normal distal tapering. Pancreas: No pancreatic mass or inflammation. Mild pancreatic atrophy. Spleen: Normal in size without focal abnormality. Adrenals/Urinary Tract: 17 mm left adrenal mass, stable from the prior CT. Left kidney is swollen with a poorly defined collecting system that appears at least mildly dilated. There is a nonobstructing stone in the lower pole. Right ureter is mildly dilated. It is not well visualized distally. No convincing ureteral stone. A nephrostomy catheter lies within the right kidney. No significant right hydronephrosis. Right ureter is not well followed distally. No convincing ureteral stone. No  renal masses. Bladder is decompressed with a Foley catheter. Nephrostomy tube on the left has been removed since the prior CT. Stomach/Bowel: No bowel obstruction or convincing inflammation. Right mid abdomen colostomy. Stomach is unremarkable. Vascular/Lymphatic: There are prominent retroperitoneal lymph nodes, none pathologically enlarged, all stable from the prior CT. No vascular abnormality. Reproductive: Uterus is surgically absent.  No discrete pelvic mass. Other: The presacral collection noted on the prior CT dated 09/15/2017, is no longer discretely seen. There is still soft tissue attenuation in this location adjacent to surgical vascular clips. The left posterior pelvic pigtail catheter has been removed since the prior exam. No significant ascites. Musculoskeletal: No fracture or acute finding. No osteoblastic or osteolytic lesions. IMPRESSION: 1. Since the prior CT, the left posterior pelvic pigtail catheter has been  removed. This catheter had withdrawn into the gluteus maximus from the presacral collection seen on the CT dated 09/15/2017. 2. The presacral fluid collection, where the pigtail catheter had originally been placed, is no longer discretely visualized, although there is still soft tissue attenuation in the presacral space. The residual soft tissue attenuation may be postsurgical scarring/change. No definite residual or recurrent abscess. 3. There are new lower lobe airspace opacities at the lung bases, which may all be atelectasis. Pneumonia should be suspected if there are consistent clinical findings. 4. Left nephrostomy tube has been removed since the prior CT. Left kidney appears swollen with at least mild hydronephrosis. The proximal left ureter is mildly distended. The distal ureter is not well-defined. 5. No change in the appearance of the right kidney with stable positioning of the right nephrostomy tube. 6. 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 Electronically Signed   By: Lajean Manes M.D.   On: 10/02/2017 10:13   Ct Head Wo Contrast  Result Date: 10/02/2017 CLINICAL DATA:  Altered mental status.  Nonverbal. EXAM: CT HEAD WITHOUT CONTRAST TECHNIQUE: Contiguous axial images were obtained from the base of the skull through the vertex without intravenous contrast. COMPARISON:  None. FINDINGS: Brain: No evidence of acute infarction, hemorrhage, hydrocephalus, extra-axial collection or mass lesion/mass effect. Vascular: No hyperdense vessel or unexpected calcification. Skull: Normal. Negative for fracture or focal lesion. Sinuses/Orbits: Globes and orbits are unremarkable. There is a small amount of dependent fluid in the right sphenoid sinus. Sinuses otherwise clear. Clear mastoid air cells. Other: None. IMPRESSION: No intracranial abnormality. Electronically Signed   By: Lajean Manes M.D.   On: 10/02/2017 12:04   Dg Chest Port 1 View  Result Date: 10/03/2017 CLINICAL DATA:  Central line placement. EXAM: PORTABLE CHEST 1 VIEW COMPARISON:  10/02/2017 FINDINGS: Left internal jugular central venous line has its tip projecting at the caval atrial junction. No pneumothorax. Cardiac silhouette is normal in size. No mediastinal or hilar masses. Clear lungs. IMPRESSION: 1. Left internal jugular central venous line is well positioned, tip projecting at the caval atrial junction. No pneumothorax. 2. No acute cardiopulmonary disease. Electronically Signed   By: Lajean Manes M.D.   On: 10/03/2017 12:17   Dg Chest Portable 1 View  Result Date: 10/02/2017 CLINICAL DATA:  Lethargy, dyspnea EXAM: PORTABLE CHEST 1 VIEW COMPARISON:  08/31/2017 FINDINGS: Lungs are clear.  No pleural effusion or pneumothorax. The heart is normal in size. IMPRESSION: No evidence of acute cardiopulmonary disease. Electronically Signed   By: Julian Hy M.D.   On: 10/02/2017 08:12   Ir Nephrostomy Placement Left  Result Date: 10/02/2017 INDICATION: History  of endometrial cancer complicated by development of bilateral ureteral obstruction, post bilateral percutaneous nephrostomy catheter placement on 09/02/2017. Unfortunately, the patient has now been admitted with interval removal of left-sided nephrostomy catheter, as well as findings worrisome for impending urosepsis with markedly cloudy urine within the right nephrostomy. As such, request made for image guided replacement of left-sided nephrostomy catheter as well as right-sided nephrostomy catheter exchange and up sizing. EXAM: 1. FLUOROSCOPIC GUIDED REPLACEMENT OF LEFT-SIDED NEPHROSTOMY CATHETER 2. FLUOROSCOPIC GUIDED EXCHANGE OF RIGHT-SIDED NEPHROSTOMY CATHETER EXCHANGE COMPARISON:  CT abdomen and pelvis - 10/02/2017; 09/22/2017; ultrasound and fluoroscopic guided bilateral percutaneous nephrostomy catheter placement - 09/02/2017 CONTRAST:  A total of 20 mL Isovue-300 administered was administered into both collecting systems FLUOROSCOPY TIME:  4 minute 18 seconds (88 mGy) COMPLICATIONS: None immediate. TECHNIQUE: Informed written  consent was obtained from the the patient's family after a discussion of the risks, benefits and alternatives to treatment. Questions regarding the procedure were encouraged and answered. A timeout was performed prior to the initiation of the procedure. The bilateral flanks and external portions of the existing right-sided percutaneous nephrostomy catheter and entrance site of inadvertently removed left-sided nephrostomy catheter were prepped and draped in the usual sterile fashion. A sterile drape was applied covering the operative field. Maximum barrier sterile technique with sterile gowns and gloves were used for the procedure. A timeout was performed prior to the initiation of the procedure. A pre procedural spot fluoroscopic image was obtained. The entrance site of the inadvertently removed left-sided nephrostomy catheter was cannulated with a Christmas tree adapter. Contrast  injection was performed. With the use of a regular glidewire, a Kumpe catheter was advanced through the subcutaneous tract to the level of the left renal pelvis. Contrast injection confirmed appropriate positioning. Next, over a short Amplatz wire, a new 21 French percutaneous drainage catheter was advanced through the existing nephrostomy catheter track with end ultimately coiled and locked within the left renal pelvis. Contrast injection confirmed appropriate positioning. A sample of urine was aspirated, capped and sent to the laboratory for analysis. Attention was now paid towards the exchange and up sizing of the right-sided nephrostomy catheter. Contrast injection confirmed appropriate positioning and functionality of existing right-sided nephrostomy catheter. A small amount of urine was aspirated from the existing right-sided nephrostomy catheter, capped and sent to the laboratory for analysis. Next, the external portion of the nephrostomy catheter was cut and cannulated with a short Amplatz wire which was coiled within the right renal pelvis. Under intermittent fluoroscopic guidance, the existing 10 French nephrostomy catheter was exchanged for a new, slightly larger now 12 French nephrostomy catheter with end ultimately coiled and locked within the right renal pelvis. Contrast injection confirmed appropriate positioning. Both nephrostomy catheters were secured at the skin entrance site with 2 interrupted sutures and StatLock devices. Both nephrostomy catheters were reconnected to gravity bags. Dressings were placed. The patient tolerated the above procedures well without immediate postprocedural complication. FINDINGS: Subcutaneous tract injection demonstrated persistent patency of the left-sided percutaneous nephrostomy catheter tract. Successful fluoroscopic guided recanalization of the left-sided nephrostomy catheter track allowing placement of a new 12 French percutaneous drainage catheter with end  coiled end locked within the left renal pelvis. Successful fluoroscopic guided exchange and up sizing of new now 20 French right-sided percutaneous nephrostomy catheter with end coiled and locked within the right renal pelvis. IMPRESSION: 1. Successful fluoroscopic guided recanalization of the left-sided nephrostomy catheter track allowing placement of a new 12 French percutaneous drainage catheter with and coiled end locked within the left renal pelvis. 2. Successful fluoroscopic guided exchange and up sizing of new now 74 French right-sided percutaneous nephrostomy catheter with end coiled and locked within the right renal pelvis. 3. Urine sample capped and sent to the laboratory for analysis. Electronically Signed   By: Sandi Mariscal M.D.   On: 10/02/2017 18:29   Ir Nephrostomy Exchange Right  Result Date: 10/02/2017 INDICATION: History of endometrial cancer complicated by development of bilateral ureteral obstruction, post bilateral percutaneous nephrostomy catheter placement on 09/02/2017. Unfortunately, the patient has now been admitted with interval removal of left-sided nephrostomy catheter, as well as findings worrisome for impending urosepsis with markedly cloudy urine within the right nephrostomy. As such, request made for image guided replacement of left-sided nephrostomy catheter as well as right-sided nephrostomy catheter exchange and up sizing.  EXAM: 1. FLUOROSCOPIC GUIDED REPLACEMENT OF LEFT-SIDED NEPHROSTOMY CATHETER 2. FLUOROSCOPIC GUIDED EXCHANGE OF RIGHT-SIDED NEPHROSTOMY CATHETER EXCHANGE COMPARISON:  CT abdomen and pelvis - 10/02/2017; 09/22/2017; ultrasound and fluoroscopic guided bilateral percutaneous nephrostomy catheter placement - 09/02/2017 CONTRAST:  A total of 20 mL Isovue-300 administered was administered into both collecting systems FLUOROSCOPY TIME:  4 minute 18 seconds (88 mGy) COMPLICATIONS: None immediate. TECHNIQUE: Informed written consent was obtained from the the patient's  family after a discussion of the risks, benefits and alternatives to treatment. Questions regarding the procedure were encouraged and answered. A timeout was performed prior to the initiation of the procedure. The bilateral flanks and external portions of the existing right-sided percutaneous nephrostomy catheter and entrance site of inadvertently removed left-sided nephrostomy catheter were prepped and draped in the usual sterile fashion. A sterile drape was applied covering the operative field. Maximum barrier sterile technique with sterile gowns and gloves were used for the procedure. A timeout was performed prior to the initiation of the procedure. A pre procedural spot fluoroscopic image was obtained. The entrance site of the inadvertently removed left-sided nephrostomy catheter was cannulated with a Christmas tree adapter. Contrast injection was performed. With the use of a regular glidewire, a Kumpe catheter was advanced through the subcutaneous tract to the level of the left renal pelvis. Contrast injection confirmed appropriate positioning. Next, over a short Amplatz wire, a new 71 French percutaneous drainage catheter was advanced through the existing nephrostomy catheter track with end ultimately coiled and locked within the left renal pelvis. Contrast injection confirmed appropriate positioning. A sample of urine was aspirated, capped and sent to the laboratory for analysis. Attention was now paid towards the exchange and up sizing of the right-sided nephrostomy catheter. Contrast injection confirmed appropriate positioning and functionality of existing right-sided nephrostomy catheter. A small amount of urine was aspirated from the existing right-sided nephrostomy catheter, capped and sent to the laboratory for analysis. Next, the external portion of the nephrostomy catheter was cut and cannulated with a short Amplatz wire which was coiled within the right renal pelvis. Under intermittent fluoroscopic  guidance, the existing 10 French nephrostomy catheter was exchanged for a new, slightly larger now 12 French nephrostomy catheter with end ultimately coiled and locked within the right renal pelvis. Contrast injection confirmed appropriate positioning. Both nephrostomy catheters were secured at the skin entrance site with 2 interrupted sutures and StatLock devices. Both nephrostomy catheters were reconnected to gravity bags. Dressings were placed. The patient tolerated the above procedures well without immediate postprocedural complication. FINDINGS: Subcutaneous tract injection demonstrated persistent patency of the left-sided percutaneous nephrostomy catheter tract. Successful fluoroscopic guided recanalization of the left-sided nephrostomy catheter track allowing placement of a new 12 French percutaneous drainage catheter with end coiled end locked within the left renal pelvis. Successful fluoroscopic guided exchange and up sizing of new now 42 French right-sided percutaneous nephrostomy catheter with end coiled and locked within the right renal pelvis. IMPRESSION: 1. Successful fluoroscopic guided recanalization of the left-sided nephrostomy catheter track allowing placement of a new 12 French percutaneous drainage catheter with and coiled end locked within the left renal pelvis. 2. Successful fluoroscopic guided exchange and up sizing of new now 17 French right-sided percutaneous nephrostomy catheter with end coiled and locked within the right renal pelvis. 3. Urine sample capped and sent to the laboratory for analysis. Electronically Signed   By: Sandi Mariscal M.D.   On: 10/02/2017 18:29    Labs:  CBC: Recent Labs    09/23/17 0339 10/02/17  1791 10/03/17 0253 10/04/17 0220  WBC 13.1* 2.9* 4.5 3.2*  HGB 8.9* 9.9* 8.3* 7.3*  HCT 28.9* 32.2* 27.2* 23.6*  PLT 303 387 332 286    COAGS: Recent Labs    04/16/17 0047 09/02/17 0939 10/02/17 2221  INR 1.21 1.21 1.34    BMP: Recent Labs     09/23/17 0339 10/02/17 0729 10/03/17 0253 10/04/17 0220  NA 137 136 141 145  K 3.6 4.0 3.6 2.4*  CL 108 109 112* 106  CO2 17* 14* 16* 27  GLUCOSE 125* 123* 155* 83  BUN 30* 52* 52* 49*  CALCIUM 8.8* 9.2 8.5* 8.1*  CREATININE 1.85* 3.74* 3.27* 2.78*  GFRNONAA 28* 12* 14* 17*  GFRAA 32* 14* 16* 20*    LIVER FUNCTION TESTS: Recent Labs    09/15/17 0345 09/18/17 0416 10/02/17 0729 10/03/17 0253 10/04/17 0220  BILITOT 0.3 0.2* 0.4  --  0.7  AST 15 21 15   --  18  ALT 21 21 20   --  19  ALKPHOS 131* 160* 248*  --  226*  PROT 6.5 6.9 7.7  --  5.6*  ALBUMIN 1.9* 2.0* 1.9* 1.5* 1.5*    Assessment and Plan: Bilateral hydronephrosis, UTI Patient with left-sided nephrostomy replacement, new placement on the right.  Both tubes with good UOP.  Keep insertion sites clean and dry.  IR to follow.   Electronically Signed: Docia Barrier, PA 10/04/2017, 11:36 AM   I spent a total of 15 Minutes at the the patient's bedside AND on the patient's hospital floor or unit, greater than 50% of which was counseling/coordinating care for bilateral hydronephrosis.

## 2017-10-04 NOTE — Progress Notes (Signed)
[facility]   Reason for visit: Follow up on stent infection  Interval History: improved mental status today, husband at bedside; afebrile, WBC 3.2.  No associated rash, diarrhea  Physical Exam: Constitutional:  Vitals:   10/04/17 0300 10/04/17 0822  BP: (!) 107/49 (!) 115/49  Pulse: 68 93  Resp: 15 12  Temp: 98 F (36.7 C) 97.6 F (36.4 C)  SpO2: 96% 95%   patient appears in NAD Eyes: anicteric Respiratory: Normal respiratory effort; CTA B Cardiovascular: RRR GI: soft, nt, nd  Review of Systems: Constitutional: negative for fatigue and malaise Gastrointestinal: negative for diarrhea Integument/breast: negative for rash  Lab Results  Component Value Date   WBC 3.2 (L) 10/04/2017   HGB 7.3 (L) 10/04/2017   HCT 23.6 (L) 10/04/2017   MCV 90.1 10/04/2017   PLT 286 10/04/2017    Lab Results  Component Value Date   CREATININE 2.78 (H) 10/04/2017   BUN 49 (H) 10/04/2017   NA 145 10/04/2017   K 2.4 (LL) 10/04/2017   CL 106 10/04/2017   CO2 27 10/04/2017    Lab Results  Component Value Date   ALT 19 10/04/2017   AST 18 10/04/2017   ALKPHOS 226 (H) 10/04/2017     Microbiology: Recent Results (from the past 240 hour(s))  Blood culture (routine x 2)     Status: None (Preliminary result)   Collection Time: 10/02/17  7:00 AM  Result Value Ref Range Status   Specimen Description BLOOD LEFT ANTECUBITAL  Final   Special Requests IN PEDIATRIC BOTTLE Blood Culture adequate volume  Final   Culture   Final    NO GROWTH 1 DAY Performed at [facility], [address] [place], [place] [address]    Report Status PENDING  Incomplete  Blood culture (routine x 2)     Status: None (Preliminary result)   Collection Time: 10/02/17  7:25 AM  Result Value Ref Range Status   Specimen Description BLOOD LEFT HAND  Final   Special Requests IN PEDIATRIC BOTTLE Blood Culture adequate volume  Final   Culture   Final    NO GROWTH 1 DAY Performed  at [facility], [place] [address] [place], [place] [address]    Report Status PENDING  Incomplete  Urine culture     Status: Abnormal   Collection Time: 10/02/17  8:39 AM  Result Value Ref Range Status   Specimen Description URINE, RANDOM  Final   Special Requests   Final    NONE Performed at [facility], [place] [address] [place], [place] [address]    Culture MULTIPLE SPECIES PRESENT, SUGGEST RECOLLECTION (A)  Final   Report Status 10/04/2017 FINAL  Final  Urine culture     Status: Abnormal   Collection Time: 10/02/17  2:20 PM  Result Value Ref Range Status   Specimen Description URINE, CATHETERIZED  Final   Special Requests   Final    NONE Performed at [facility], [place] [address], [place],  [address]    Culture (A)  Final    20,000 COLONIES/mL METHICILLIN RESISTANT STAPHYLOCOCCUS AUREUS   Report Status 10/04/2017 FINAL  Final   Organism ID, Bacteria METHICILLIN RESISTANT STAPHYLOCOCCUS AUREUS (A)  Final      Susceptibility   Methicillin resistant staphylococcus aureus - MIC*    CIPROFLOXACIN >=8 RESISTANT Resistant     GENTAMICIN <=0.5 SENSITIVE Sensitive     NITROFURANTOIN <=16 SENSITIVE Sensitive  OXACILLIN >=4 RESISTANT Resistant     TETRACYCLINE <=1 SENSITIVE Sensitive     VANCOMYCIN <=0.5 SENSITIVE Sensitive     TRIMETH/SULFA >=320 RESISTANT Resistant     CLINDAMYCIN <=0.25 SENSITIVE Sensitive     RIFAMPIN <=0.5 SENSITIVE Sensitive     Inducible Clindamycin NEGATIVE Sensitive     * 20,000 COLONIES/mL METHICILLIN RESISTANT STAPHYLOCOCCUS AUREUS  Urine Culture     Status: Abnormal   Collection Time: 10/02/17  6:31 PM  Result Value Ref Range Status   Specimen Description KIDNEY RIGHT  Final   Special Requests   Final    NONE Performed at [facility], [place] [address] [place], [place] [address]    Culture (A)  Final    50,000 COLONIES/mL METHICILLIN RESISTANT STAPHYLOCOCCUS AUREUS   Report Status 10/04/2017 FINAL  Final   Organism  ID, Bacteria METHICILLIN RESISTANT STAPHYLOCOCCUS AUREUS (A)  Final      Susceptibility   Methicillin resistant staphylococcus aureus - MIC*    CIPROFLOXACIN >=8 RESISTANT Resistant     ERYTHROMYCIN >=8 RESISTANT Resistant     GENTAMICIN <=0.5 SENSITIVE Sensitive     OXACILLIN >=4 RESISTANT Resistant     TETRACYCLINE <=1 SENSITIVE Sensitive     VANCOMYCIN 1 SENSITIVE Sensitive     TRIMETH/SULFA >=320 RESISTANT Resistant     CLINDAMYCIN <=0.25 SENSITIVE Sensitive     RIFAMPIN <=0.5 SENSITIVE Sensitive     Inducible Clindamycin NEGATIVE Sensitive     * 50,000 COLONIES/mL METHICILLIN RESISTANT STAPHYLOCOCCUS AUREUS  MRSA PCR Screening     Status: Abnormal   Collection Time: 10/02/17  6:54 PM  Result Value Ref Range Status   MRSA by PCR POSITIVE (A) NEGATIVE Final    Comment:        The GeneXpert MRSA Assay (FDA approved for NASAL specimens only), is one component of a comprehensive MRSA colonization surveillance program. It is not intended to diagnose MRSA infection nor to guide or monitor treatment for MRSA infections. RESULT CALLED TO, READ BACK BY AND VERIFIED WITH: TDECAREAUX,RN @2050  10/02/17 BY LHOWARD Performed at [facility], [place] [address], [place], [place] [address]   Urine culture     Status: Abnormal   Collection Time: 10/02/17  7:35 PM  Result Value Ref Range Status   Specimen Description KIDNEY RIGHT  Final   Special Requests   Final    Normal Performed at [facility], [place] [address], [place], [place] [address]    Culture (A)  Final    >=100,000 COLONIES/mL METHICILLIN RESISTANT STAPHYLOCOCCUS AUREUS   Report Status 10/04/2017 FINAL  Final   Organism ID, Bacteria METHICILLIN RESISTANT STAPHYLOCOCCUS AUREUS (A)  Final      Susceptibility   Methicillin resistant staphylococcus aureus - MIC*    CIPROFLOXACIN >=8 RESISTANT Resistant     ERYTHROMYCIN >=8 RESISTANT Resistant     GENTAMICIN <=0.5 SENSITIVE Sensitive     OXACILLIN >=4 RESISTANT  Resistant     TETRACYCLINE <=1 SENSITIVE Sensitive     VANCOMYCIN 1 SENSITIVE Sensitive     TRIMETH/SULFA >=320 RESISTANT Resistant     CLINDAMYCIN <=0.25 SENSITIVE Sensitive     RIFAMPIN <=0.5 SENSITIVE Sensitive     Inducible Clindamycin NEGATIVE Sensitive     * >=100,000 COLONIES/mL METHICILLIN RESISTANT STAPHYLOCOCCUS AUREUS    Impression/Plan:  1. Stent infection - changed by IR during this hospitalization.  All growth with MRSA.  She has responded to daptomycin.  She can continue daptomycin in the hospital and she  can go out with linezolid orally twice a day for 2 weeks more total. Ok from ID standpoint for d/c.    2.  Wound care - discussed with Tennille and recommending hydrotherapy if she can tolerate.    3.  AMS - responding to above treatment.

## 2017-10-04 NOTE — Progress Notes (Signed)
[facility] Rounding Note  Subjective/Interval History:  Cultures all back + for MRSA Has been changed to Dapto for 2 weeks Renal function also is improving though not back quite to baseline of 2 To start hydro rx for decub Tolerating liquids now   Objective Vital signs in last 24 hours: Vitals:   10/04/17 0100 10/04/17 0300 10/04/17 0425 10/04/17 0822  BP: (!) 108/57 (!) 107/49  (!) 115/49  Pulse: 76 68  93  Resp: 13 15  12   Temp: 98.3 F (36.8 C) 98 F (36.7 C)  97.6 F (36.4 C)  TempSrc: Oral Oral  Oral  SpO2: 94% 96%  95%  Weight:   91.2 kg (201 lb)    Weight change: 1.473 kg (3 lb 4 oz)  Intake/Output Summary (Last 24 hours) at 10/04/2017 0950 Last data filed at 10/04/2017 0854 Gross per 24 hour  Intake 4689.17 ml  Output 2195 ml  Net 2494.17 ml   Physical Exam:  Blood pressure (!) 115/49, pulse 93, temperature 97.6 F (36.4 C), temperature source Oral, resp. rate 12, weight 91.2 kg (201 lb), SpO2 95 %.   Can't get comfortable, lying on L side MUCH more awake and alert Husband in with her Coarse BS but lungs clear A1P3 No S3 1/6 systolic murmur no diastolic murmur Ostomy in place No LE edema PCN sites intact Sacral decub covered (husband showed me photo from last week)    Recent Labs  Lab 10/02/17 0729 10/03/17 0253 10/04/17 0220  NA 136 141 145  K 4.0 3.6 2.4*  CL 109 112* 106  CO2 14* 16* 27  GLUCOSE 123* 155* 83  BUN 52* 52* 49*  CREATININE 3.74* 3.27* 2.78*  CALCIUM 9.2 8.5* 8.1*  PHOS  --  5.3* 3.6    Recent Labs  Lab 10/02/17 0729 10/03/17 0253 10/04/17 0220  AST 15  --  18  ALT 20  --  19  ALKPHOS 248*  --  226*  BILITOT 0.4  --  0.7  PROT 7.7  --  5.6*  ALBUMIN 1.9* 1.5* 1.5*    Recent Labs  Lab 10/02/17 0729 10/03/17 0253 10/04/17 0220  WBC 2.9* 4.5 3.2*  NEUTROABS 1.5*  --   --   HGB 9.9* 8.3* 7.3*  HCT 32.2* 27.2* 23.6*  MCV 94.7 92.5 90.1  PLT 387 332 286    Recent Labs  Lab 10/02/17 2221 10/03/17 0253  CKTOTAL 16*  18*   CBG: Recent Labs  Lab 10/02/17 2043 10/03/17 0834 10/03/17 1140 10/03/17 1622 10/04/17 0824  GLUCAP 141* 119* 94 93 72   Urine cultures all + MRSA Blood cultures negative to daye  Studies/Results: Ct Abdomen Pelvis Wo Contrast  Result Date: 10/02/2017 CLINICAL DATA:  Pt has sepsis Had recent enterococcus wound infection and also serratia UTI EXAM: CT ABDOMEN AND PELVIS WITHOUT CONTRAST TECHNIQUE: Multidetector CT imaging of the abdomen and pelvis was performed following the standard protocol without IV contrast. COMPARISON:  09/22/2017 FINDINGS: Lower chest: There is patchy opacity in the lower lobes at the lung bases, right greater than left. This is new since the prior CT. This may all be atelectasis. Pneumonia should be considered if there are consistent clinical findings. No pulmonary edema. No pleural effusion. Hepatobiliary: Normal liver. Gallbladder surgically absent. Mild prominence of common bile duct with normal distal tapering. Pancreas: No pancreatic mass or inflammation. Mild pancreatic atrophy. Spleen: Normal in size without focal abnormality. Adrenals/Urinary Tract: 17 mm left adrenal mass, stable from the prior CT. Left  kidney is swollen with a poorly defined collecting system that appears at least mildly dilated. There is a nonobstructing stone in the lower pole. Right ureter is mildly dilated. It is not well visualized distally. No convincing ureteral stone. A nephrostomy catheter lies within the right kidney. No significant right hydronephrosis. Right ureter is not well followed distally. No convincing ureteral stone. No renal masses. Bladder is decompressed with a Foley catheter. Nephrostomy tube on the left has been removed since the prior CT. Stomach/Bowel: No bowel obstruction or convincing inflammation. Right mid abdomen colostomy. Stomach is unremarkable. Vascular/Lymphatic: There are prominent retroperitoneal lymph nodes, none pathologically enlarged, all stable from  the prior CT. No vascular abnormality. Reproductive: Uterus is surgically absent.  No discrete pelvic mass. Other: The presacral collection noted on the prior CT dated 09/15/2017, is no longer discretely seen. There is still soft tissue attenuation in this location adjacent to surgical vascular clips. The left posterior pelvic pigtail catheter has been removed since the prior exam. No significant ascites. Musculoskeletal: No fracture or acute finding. No osteoblastic or osteolytic lesions. IMPRESSION: 1. Since the prior CT, the left posterior pelvic pigtail catheter has been removed. This catheter had withdrawn into the gluteus maximus from the presacral collection seen on the CT dated 09/15/2017. 2. The presacral fluid collection, where the pigtail catheter had originally been placed, is no longer discretely visualized, although there is still soft tissue attenuation in the presacral space. The residual soft tissue attenuation may be postsurgical scarring/change. No definite residual or recurrent abscess. 3. There are new lower lobe airspace opacities at the lung bases, which may all be atelectasis. Pneumonia should be suspected if there are consistent clinical findings. 4. Left nephrostomy tube has been removed since the prior CT. Left kidney appears swollen with at least mild hydronephrosis. The proximal left ureter is mildly distended. The distal ureter is not well-defined. 5. No change in the appearance of the right kidney with stable positioning of the right nephrostomy tube. 6. Changes from bowel surgery and formation of the right mid to upper quadrant colostomy are stable. Electronically Signed   By: Lajean Manes M.D.   On: 10/02/2017 10:13   Ct Head Wo Contrast  Result Date: 10/02/2017 CLINICAL DATA:  Altered mental status.  Nonverbal. EXAM: CT HEAD WITHOUT CONTRAST TECHNIQUE: Contiguous axial images were obtained from the base of the skull through the vertex without intravenous contrast. COMPARISON:   None. FINDINGS: Brain: No evidence of acute infarction, hemorrhage, hydrocephalus, extra-axial collection or mass lesion/mass effect. Vascular: No hyperdense vessel or unexpected calcification. Skull: Normal. Negative for fracture or focal lesion. Sinuses/Orbits: Globes and orbits are unremarkable. There is a small amount of dependent fluid in the right sphenoid sinus. Sinuses otherwise clear. Clear mastoid air cells. Other: None. IMPRESSION: No intracranial abnormality. Electronically Signed   By: Lajean Manes M.D.   On: 10/02/2017 12:04   Dg Chest Port 1 View  Result Date: 10/03/2017 CLINICAL DATA:  Central line placement. EXAM: PORTABLE CHEST 1 VIEW COMPARISON:  10/02/2017 FINDINGS: Left internal jugular central venous line has its tip projecting at the caval atrial junction. No pneumothorax. Cardiac silhouette is normal in size. No mediastinal or hilar masses. Clear lungs. IMPRESSION: 1. Left internal jugular central venous line is well positioned, tip projecting at the caval atrial junction. No pneumothorax. 2. No acute cardiopulmonary disease. Electronically Signed   By: Lajean Manes M.D.   On: 10/03/2017 12:17   Ir Nephrostomy Placement Left  Result Date: 10/02/2017 INDICATION: History of  endometrial cancer complicated by development of bilateral ureteral obstruction, post bilateral percutaneous nephrostomy catheter placement on 09/02/2017. Unfortunately, the patient has now been admitted with interval removal of left-sided nephrostomy catheter, as well as findings worrisome for impending urosepsis with markedly cloudy urine within the right nephrostomy. As such, request made for image guided replacement of left-sided nephrostomy catheter as well as right-sided nephrostomy catheter exchange and up sizing. EXAM: 1. FLUOROSCOPIC GUIDED REPLACEMENT OF LEFT-SIDED NEPHROSTOMY CATHETER 2. FLUOROSCOPIC GUIDED EXCHANGE OF RIGHT-SIDED NEPHROSTOMY CATHETER EXCHANGE COMPARISON:  CT abdomen and pelvis -  10/02/2017; 09/22/2017; ultrasound and fluoroscopic guided bilateral percutaneous nephrostomy catheter placement - 09/02/2017 CONTRAST:  A total of 20 mL Isovue-300 administered was administered into both collecting systems FLUOROSCOPY TIME:  4 minute 18 seconds (88 mGy) COMPLICATIONS: None immediate. TECHNIQUE: Informed written consent was obtained from the the patient's family after a discussion of the risks, benefits and alternatives to treatment. Questions regarding the procedure were encouraged and answered. A timeout was performed prior to the initiation of the procedure. The bilateral flanks and external portions of the existing right-sided percutaneous nephrostomy catheter and entrance site of inadvertently removed left-sided nephrostomy catheter were prepped and draped in the usual sterile fashion. A sterile drape was applied covering the operative field. Maximum barrier sterile technique with sterile gowns and gloves were used for the procedure. A timeout was performed prior to the initiation of the procedure. A pre procedural spot fluoroscopic image was obtained. The entrance site of the inadvertently removed left-sided nephrostomy catheter was cannulated with a Christmas tree adapter. Contrast injection was performed. With the use of a regular glidewire, a Kumpe catheter was advanced through the subcutaneous tract to the level of the left renal pelvis. Contrast injection confirmed appropriate positioning. Next, over a short Amplatz wire, a new 22 French percutaneous drainage catheter was advanced through the existing nephrostomy catheter track with end ultimately coiled and locked within the left renal pelvis. Contrast injection confirmed appropriate positioning. A sample of urine was aspirated, capped and sent to the laboratory for analysis. Attention was now paid towards the exchange and up sizing of the right-sided nephrostomy catheter. Contrast injection confirmed appropriate positioning and  functionality of existing right-sided nephrostomy catheter. A small amount of urine was aspirated from the existing right-sided nephrostomy catheter, capped and sent to the laboratory for analysis. Next, the external portion of the nephrostomy catheter was cut and cannulated with a short Amplatz wire which was coiled within the right renal pelvis. Under intermittent fluoroscopic guidance, the existing 10 French nephrostomy catheter was exchanged for a new, slightly larger now 12 French nephrostomy catheter with end ultimately coiled and locked within the right renal pelvis. Contrast injection confirmed appropriate positioning. Both nephrostomy catheters were secured at the skin entrance site with 2 interrupted sutures and StatLock devices. Both nephrostomy catheters were reconnected to gravity bags. Dressings were placed. The patient tolerated the above procedures well without immediate postprocedural complication. FINDINGS: Subcutaneous tract injection demonstrated persistent patency of the left-sided percutaneous nephrostomy catheter tract. Successful fluoroscopic guided recanalization of the left-sided nephrostomy catheter track allowing placement of a new 12 French percutaneous drainage catheter with end coiled end locked within the left renal pelvis. Successful fluoroscopic guided exchange and up sizing of new now 108 French right-sided percutaneous nephrostomy catheter with end coiled and locked within the right renal pelvis. IMPRESSION: 1. Successful fluoroscopic guided recanalization of the left-sided nephrostomy catheter track allowing placement of a new 12 French percutaneous drainage catheter with and coiled end locked within the  left renal pelvis. 2. Successful fluoroscopic guided exchange and up sizing of new now 75 French right-sided percutaneous nephrostomy catheter with end coiled and locked within the right renal pelvis. 3. Urine sample capped and sent to the laboratory for analysis. Electronically  Signed   By: Sandi Mariscal M.D.   On: 10/02/2017 18:29   Ir Nephrostomy Exchange Right  Result Date: 10/02/2017 INDICATION: History of endometrial cancer complicated by development of bilateral ureteral obstruction, post bilateral percutaneous nephrostomy catheter placement on 09/02/2017. Unfortunately, the patient has now been admitted with interval removal of left-sided nephrostomy catheter, as well as findings worrisome for impending urosepsis with markedly cloudy urine within the right nephrostomy. As such, request made for image guided replacement of left-sided nephrostomy catheter as well as right-sided nephrostomy catheter exchange and up sizing. EXAM: 1. FLUOROSCOPIC GUIDED REPLACEMENT OF LEFT-SIDED NEPHROSTOMY CATHETER 2. FLUOROSCOPIC GUIDED EXCHANGE OF RIGHT-SIDED NEPHROSTOMY CATHETER EXCHANGE COMPARISON:  CT abdomen and pelvis - 10/02/2017; 09/22/2017; ultrasound and fluoroscopic guided bilateral percutaneous nephrostomy catheter placement - 09/02/2017 CONTRAST:  A total of 20 mL Isovue-300 administered was administered into both collecting systems FLUOROSCOPY TIME:  4 minute 18 seconds (88 mGy) COMPLICATIONS: None immediate. TECHNIQUE: Informed written consent was obtained from the the patient's family after a discussion of the risks, benefits and alternatives to treatment. Questions regarding the procedure were encouraged and answered. A timeout was performed prior to the initiation of the procedure. The bilateral flanks and external portions of the existing right-sided percutaneous nephrostomy catheter and entrance site of inadvertently removed left-sided nephrostomy catheter were prepped and draped in the usual sterile fashion. A sterile drape was applied covering the operative field. Maximum barrier sterile technique with sterile gowns and gloves were used for the procedure. A timeout was performed prior to the initiation of the procedure. A pre procedural spot fluoroscopic image was obtained. The  entrance site of the inadvertently removed left-sided nephrostomy catheter was cannulated with a Christmas tree adapter. Contrast injection was performed. With the use of a regular glidewire, a Kumpe catheter was advanced through the subcutaneous tract to the level of the left renal pelvis. Contrast injection confirmed appropriate positioning. Next, over a short Amplatz wire, a new 82 French percutaneous drainage catheter was advanced through the existing nephrostomy catheter track with end ultimately coiled and locked within the left renal pelvis. Contrast injection confirmed appropriate positioning. A sample of urine was aspirated, capped and sent to the laboratory for analysis. Attention was now paid towards the exchange and up sizing of the right-sided nephrostomy catheter. Contrast injection confirmed appropriate positioning and functionality of existing right-sided nephrostomy catheter. A small amount of urine was aspirated from the existing right-sided nephrostomy catheter, capped and sent to the laboratory for analysis. Next, the external portion of the nephrostomy catheter was cut and cannulated with a short Amplatz wire which was coiled within the right renal pelvis. Under intermittent fluoroscopic guidance, the existing 10 French nephrostomy catheter was exchanged for a new, slightly larger now 12 French nephrostomy catheter with end ultimately coiled and locked within the right renal pelvis. Contrast injection confirmed appropriate positioning. Both nephrostomy catheters were secured at the skin entrance site with 2 interrupted sutures and StatLock devices. Both nephrostomy catheters were reconnected to gravity bags. Dressings were placed. The patient tolerated the above procedures well without immediate postprocedural complication. FINDINGS: Subcutaneous tract injection demonstrated persistent patency of the left-sided percutaneous nephrostomy catheter tract. Successful fluoroscopic guided  recanalization of the left-sided nephrostomy catheter track allowing placement of a new 12  French percutaneous drainage catheter with end coiled end locked within the left renal pelvis. Successful fluoroscopic guided exchange and up sizing of new now 68 French right-sided percutaneous nephrostomy catheter with end coiled and locked within the right renal pelvis. IMPRESSION: 1. Successful fluoroscopic guided recanalization of the left-sided nephrostomy catheter track allowing placement of a new 12 French percutaneous drainage catheter with and coiled end locked within the left renal pelvis. 2. Successful fluoroscopic guided exchange and up sizing of new now 51 French right-sided percutaneous nephrostomy catheter with end coiled and locked within the right renal pelvis. 3. Urine sample capped and sent to the laboratory for analysis. Electronically Signed   By: Sandi Mariscal M.D.   On: 10/02/2017 18:29   Medications: . DAPTOmycin (CUBICIN)  IV Stopped (10/03/17 1622)  . magnesium sulfate 1 - 4 g bolus IVPB 2 g (10/04/17 0941)  .  sodium bicarbonate (isotonic) infusion in sterile water 125 mL/hr at 10/04/17 0602   . artificial tears  1 application Both Eyes QHS  . chlorhexidine  15 mL Mouth Rinse BID  . Chlorhexidine Gluconate Cloth  6 each Topical Q0600  . collagenase   Topical Daily  . enoxaparin (LOVENOX) injection  30 mg Subcutaneous Q24H  . feeding supplement (PRO-STAT SUGAR FREE 64)  30 mL Oral TID  . fentaNYL  25 mcg Transdermal Q72H  . insulin aspart  2-12 Units Subcutaneous TID WC  . insulin aspart  7 Units Subcutaneous TID WC  . levothyroxine  150 mcg Oral QAC breakfast  . mouth rinse  15 mL Mouth Rinse q12n4p  . multivitamin with minerals  1 tablet Oral Daily  . mupirocin ointment  1 application Nasal BID  . polyvinyl alcohol  1 drop Both Eyes QHS  . pregabalin  50 mg Oral BID  . psyllium  1 packet Oral BID  . sodium chloride flush  3 mL Intravenous Q12H  . sodium chloride flush  5 mL  Intracatheter Q8H  . Tbo-Filgrastim  480 mcg Subcutaneous 6 days   Background: 62 y.o. 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 Fluid collection w/ CT guided drain placed on 09-16-17, removed 2/2 malpositioning. Cultures + last adm for  GBS, lactobacillus, serratia and yeast on cipro PTA. Adm 10/03/17 from SNF with fevers, AMS. IN ED L nephrostomy fell out w/purulent urine from site. We were asked to see for recurrent AKI on CKD creatinine up to 3.7  (most recent creatinine PTA new baseline of 2; baseline prior to 08/2017 admit was around 02 Jun 2017).   Assessment/Recommendations  1. AKI on CKD - most recent (new) baseline creatinine around 2 prior to current admission. Last AKI was early 08/2017 w/creatinine at that time up to 4.28, had bilat perc tubes placed to divert urine from bladder (colovesical fistula), + vol depletion. Current AKI max creatinine 3.74 some vol depletion, could not exclude obstruction (L perc replaced after coming out, w/purulent urine from exit site, and R perc tube exchanged 10/02/17) and infection. Creatinine improving daily.  2. Hypokalemia - K down to 2.4. RN says cannot/will not take orally. K runs ordered X4 3. Hypomagnesemia - 1.1 yesterday. Mg infusion ordered for QDX3. Mg not done today ordered for QD 4. Metabolic acidosis- bicarb drip at 125. CO2 has corrected. Stop bicarb drip. When CO2 starts coming down then  transition to po bicarb. NS at 50 until good po 5. Urosepsis - all UC + MRSA. ID recommending dapto X 2 weeks 6. Chronic obstructive uropathy/colovesical fistula - L PCN replaced, R exchanged 3/3 7. Anemia - with prior Fe def (unable to give IV Fe for tsat of 7 last adm due to infection; similar issues now). Transfuse prn.   8. Chronic neutropenia - on chronic filagrastin at home.  9. Encephalopathy 10. MRSA PCR +  [name], MD [facility] [phone] Pager 10/04/2017, 9:50 AM

## 2017-10-04 NOTE — Consult Note (Signed)
[place] Nurse wound follow up Wound type: Sacral Unstageable Pressure injury, POA. Unchanged Discussed with patient and husband today options for care. They wish to proceed with hydrotherapy for debridement.  She has been placed on low air loss mattress and has remained on. She is much more alert but drowsy today. Can converse some with [name] and husband.  [name] Nurse ostomy follow up Stoma type/location: R central abdomen Stomal assessment/size: 1 inch, flush, pink Peristomal assessment: intact Treatment options for stomal/peristomal skin: see steps below Output liquid/gel like green  Ostomy pouching: 1pc.flexible convex Patient and husband are independent with complex ostomy pouching.  Lasting for 4 days now. Ostomy care (husband performs most steps). WOC at bedside to assist. 1. Used adhesive remover wipes and releaser spray to aid in removal of ostomy pouch 2. Wash peristomal skin with softsoap (husband to provide),  then dry area 3. Use papertowel to keep stool from leaking on skin while performing pouch change, ok to also use small tampon in stoma (husband to provide). Will need to have another person hold tampon in place if [name] is not able to assist 4. Making sure that peristomal skin is dry apply [place] to persistomal skin, allow to dry 5. Apply 1/3 piece of ostomy Eakin seal on back of pouch wafer at 3 and 9 o'clock  6. Apply 1 full Eakin seal next, stretched to fit the cut opening 7. Spray her skin and the back of the ostomy pouch with medical adhesive spray (WAIT 5 MINUTES) 8. Apply one small bead of ostomy paste around the stoma, use wet finger to flatten into abdominal creases 9.  Place pouch, hold in place with hands on to warm barrier for 5 minutes  Pouch in place, plans for husband to change with assistance of bedside nurse Saturday unless leakage before. Husband is very pleased with condition of her peristomal skin at this time.  [place] nurse team will follow along  for support with wound and ostomy care. [place], [name], [name]

## 2017-10-04 NOTE — Progress Notes (Signed)
CRITICAL VALUE ALERT  Critical Value:  K 2.4  Date & Time Notied:  10/04/17 @ 0309  Provider Notified: Tylene Fantasia  Orders Received/Actions taken: orders obtained for PO K.  See MAR.

## 2017-10-04 NOTE — Progress Notes (Signed)
Subjective/Chief Complaint:  1 - Bladder injury sustained during pelvic resection for advanced endometrial cancer 2016 / right ureteral stricture - S/p robotic RIGHT ureteral reimplant with psoas hitch + HM Flap 04/01/17 at time of colostomy take down / adhesiolysis, small bowel resection, loop iliostomy.Stent removed 08/17/17. FU CT 08/25/17 with resolved right hydro.Bilateral PCNs placed 09/02/17 for diversion away from rectovesical fistula, opposed to obstruction, see below.   2 - Metastatic Endometrial Cancer - S/p repeat resection 11/2014 with colon and vaginal involvement but negative nodes / margins. Had adjuvant chemo-XRT which she is now done with. Follows with Dr. Alvy Bimler.  3 - Acute on Chronic Renal Failure / Severe Dehydration- Cr 3.74 from ~1.5-2 baseline. Pt with high output ileostomy, difficult to maintain hydration, has been on PRN IVF.  Renal US 08/30/17 with mild caliectasis that is stable, and no right hydro at all.CT cystogram 09/15/17 with free bilateral reflux (also confirming no high grade GU obstruction), but with significant GI-GU fistula leading to systemic absorption of urine. CT abd/pelvis w/o contrast 10/02/17 reveals absence of the L PCN, stable left mild hydronephrosis, and a right kidney w/o hydro and a properly positioned R PCN. L PCN replaced 3/3.   4 - Bacteruria - Pt with delerium  at presentation, no fevers, leukocytosis. UCX form neph tubes MRSA now on dpatomycin.   5 - Rectal - Bladder Fistula- Some drainage of yellowish thin fluid from peri-rectal area with standing per report new late 08/2017. CT cystogram 08/31/17 confims NEW bladder neck - rectal fistula with large volume urine transit into GI tract distal to loop iliostomy. Output remained high even with foley drainage). Bilat nep tubes placed 09/02/17. Left nephrostomy tube became dislodged over the past week.  PMH sig for DM2, morbid obesity, breast cancer, ovarian cancer, endometrial cancer.   Today  "[name]" is stable. Cr down some with neph tube replacement. Afebrile.     Objective: Vital signs in last 24 hours: Temp:  [97.6 F (36.4 C)-98.5 F (36.9 C)] 97.8 F (36.6 C) (03/05 1935) Pulse Rate:  [65-93] 88 (03/05 1935) Resp:  [12-18] 18 (03/05 1935) BP: (107-126)/(42-62) 126/51 (03/05 1935) SpO2:  [92 %-96 %] 96 % (03/05 1935) Weight:  [91.2 kg (201 lb)] 91.2 kg (201 lb) (03/05 0425) Last BM Date: 10/04/17  Intake/Output from previous day: 03/04 0701 - 03/05 0700 In: 3790.2 [I.V.:4309.2; IV Piggyback:360] Out: 1995 [Urine:1675; Stool:320] Intake/Output this shift: No intake/output data recorded.  General appearance: alert, cooperative and stigmata of chonic disease, AOx3 Eyes: negative Nose: Nares normal. Septum midline. Mucosa normal. No drainage or sinus tenderness. Throat: lips, mucosa, and tongue normal; teeth and gums normal Neck: supple, symmetrical, trachea midline and left neck central line in place w/o skin reaction Back: symmetric, no curvature. ROM normal. No CVA tenderness., bilateral neph tubes in place with yellow urine that is non-foul  Resp: non-labored on room air.  Cardio: Nl rate in 60s by bedside monitor GI: soft, non-tender; bowel sounds normal; no masses,  no organomegaly and stable truncal obesity wtih iliostomy that is patent of liquid stool  Pelvic: external genitalia normal and foley in place wtih yellow urine that is non-foul Extremities: extremities normal, atraumatic, no cyanosis or edema Pulses: 2+ and symmetric Lymph nodes: Cervical, supraclavicular, and axillary nodes normal. Neurologic: Grossly normal  Lab Results:  Recent Labs    10/03/17 0253 10/04/17 0220  WBC 4.5 3.2*  HGB 8.3* 7.3*  HCT 27.2* 23.6*  PLT 332 286   BMET Recent Labs  10/03/17 0253 10/04/17 0220  NA 141 145  K 3.6 2.4*  CL 112* 106  CO2 16* 27  GLUCOSE 155* 83  BUN 52* 49*  CREATININE 3.27* 2.78*  CALCIUM 8.5* 8.1*   PT/INR Recent Labs     10/02/17 2221  LABPROT 16.5*  INR 1.34   ABG Recent Labs    10/02/17 2050 10/03/17 0130  PHART 7.284* 7.324*  HCO3 11.6* 15.5*    Studies/Results: Dg Chest Port 1 View  Result Date: 10/03/2017 CLINICAL DATA:  Central line placement. EXAM: PORTABLE CHEST 1 VIEW COMPARISON:  10/02/2017 FINDINGS: Left internal jugular central venous line has its tip projecting at the caval atrial junction. No pneumothorax. Cardiac silhouette is normal in size. No mediastinal or hilar masses. Clear lungs. IMPRESSION: 1. Left internal jugular central venous line is well positioned, tip projecting at the caval atrial junction. No pneumothorax. 2. No acute cardiopulmonary disease. Electronically Signed   By: Lajean Manes M.D.   On: 10/03/2017 12:17    Anti-infectives: Anti-infectives (From admission, onward)   Start     Dose/Rate Route Frequency Ordered Stop   10/04/17 1200  vancomycin (VANCOCIN) IVPB 1000 mg/200 mL premix  Status:  Discontinued     1,000 mg 200 mL/hr over 60 Minutes Intravenous Every 48 hours 10/02/17 0903 10/02/17 1539   10/04/17 0800  levofloxacin (LEVAQUIN) IVPB 500 mg  Status:  Discontinued     500 mg 100 mL/hr over 60 Minutes Intravenous Every 48 hours 10/02/17 0903 10/02/17 1539   10/03/17 1600  DAPTOmycin (CUBICIN) 500 mg in sodium chloride 0.9 % IVPB     500 mg 220 mL/hr over 30 Minutes Intravenous Every 48 hours 10/02/17 1603     10/02/17 2000  aztreonam (AZACTAM) 500 mg in dextrose 5 % 50 mL IVPB  Status:  Discontinued     500 mg 100 mL/hr over 30 Minutes Intravenous Every 8 hours 10/02/17 0903 10/02/17 1539   10/02/17 2000  meropenem (MERREM) 500 mg in sodium chloride 0.9 % 100 mL IVPB  Status:  Discontinued     500 mg 200 mL/hr over 30 Minutes Intravenous Every 12 hours 10/02/17 1603 10/04/17 0906   10/02/17 0915  aztreonam (AZACTAM) 2 g in sodium chloride 0.9 % 100 mL IVPB     2 g 200 mL/hr over 30 Minutes Intravenous  Once 10/02/17 0902 10/02/17 1007   10/02/17 0915   vancomycin (VANCOCIN) 1,500 mg in sodium chloride 0.9 % 500 mL IVPB     1,500 mg 250 mL/hr over 120 Minutes Intravenous  Once 10/02/17 0902 10/02/17 1243   10/02/17 0730  levofloxacin (LEVAQUIN) IVPB 750 mg     750 mg 100 mL/hr over 90 Minutes Intravenous  Once 10/02/17 0723 10/02/17 0915   10/02/17 0730  aztreonam (AZACTAM) 2 g in sodium chloride 0.9 % 100 mL IVPB  Status:  Discontinued     2 g 200 mL/hr over 30 Minutes Intravenous  Once 10/02/17 0723 10/02/17 0902   10/02/17 0730  vancomycin (VANCOCIN) IVPB 1000 mg/200 mL premix  Status:  Discontinued     1,000 mg 200 mL/hr over 60 Minutes Intravenous  Once 10/02/17 8182 10/02/17 0902      Assessment/Plan:  1 - Bladder Injury / Right Ureteral Stricture -most recent imaging with good repair of this area, unfortunatley this will need diversion away at somepoint given fistula.   2 - Metastatic Endometrial Cancer -no evidence of active disease, per med-oncology.   3 - Acute on Chronic Renal Failure /  Severe Dehydration- Nephrology on board, rise in BUN and Cr likely pre-renal + exacerbation / artifact from systemic reabsorption via contact with colon via fistula. Left-sided caliectasis is chronic, and likely not indicative of obstruction. Appreciate IR team replacement of L PCN to help maximally divert urine away.   4- Bacteruria - Expected given chronic tubes, agree with current management as recent delium.  5 - Rectal - Bladder Fistula-  Continue bilateral nephrostomies and Foley to drain, as this making her hygeine and care more managable.   Long term, she will likely need permanent GI and GU diversion when nutritionally replete, this will be months away. I am cautiously optimistic she will ever get to that point.      [facility], Mad[facility]x Vans[facility]oc 10/04/2017

## 2017-10-04 NOTE — Progress Notes (Signed)
Daily Progress Note   Patient Name: [name]       Date: 10/04/2017 DOB: 24-Sep-1955  Age: 62 y.o. MRN#: [id number] Attending Physician: Georgette Shell, MD Primary Care Physician: Carollee Herter, Alferd Apa, DO Admit Date: 10/02/2017  Reason for Consultation/Follow-up: pain control, goals of care.   Subjective: Awake alert Husband at bedside Tolerated wound hydro therapy well earlier today States pain is reasonably well controlled this afternoon See below:   Length of Stay: 2  Current Medications: Scheduled Meds:  . artificial tears  1 application Both Eyes QHS  . chlorhexidine  15 mL Mouth Rinse BID  . Chlorhexidine Gluconate Cloth  6 each Topical Q0600  . collagenase   Topical Daily  . enoxaparin (LOVENOX) injection  30 mg Subcutaneous Q24H  . feeding supplement (PRO-STAT SUGAR FREE 64)  30 mL Oral BID BM  . fentaNYL  25 mcg Transdermal Q72H  . insulin aspart  2-12 Units Subcutaneous TID WC  . insulin aspart  7 Units Subcutaneous TID WC  . levothyroxine  150 mcg Oral QAC breakfast  . mouth rinse  15 mL Mouth Rinse q12n4p  . multivitamin with minerals  1 tablet Oral Daily  . mupirocin ointment  1 application Nasal BID  . polyvinyl alcohol  1 drop Both Eyes QHS  . pregabalin  50 mg Oral BID  . protein supplement shake  11 oz Oral BID BM  . psyllium  1 packet Oral BID  . sodium chloride flush  3 mL Intravenous Q12H  . sodium chloride flush  5 mL Intracatheter Q8H  . Tbo-Filgrastim  480 mcg Subcutaneous 6 days    Continuous Infusions: . DAPTOmycin (CUBICIN)  IV Stopped (10/03/17 1622)  . magnesium sulfate 1 - 4 g bolus IVPB Stopped (10/04/17 1105)  . potassium chloride 10 mEq (10/04/17 1221)  . sodium chloride 0.9 % 1,000 mL infusion 50 mL/hr at 10/04/17 1233   PPS  30% PRN Meds: acetaminophen, diphenhydrAMINE, fentaNYL (SUBLIMAZE) injection, lidocaine (PF), naloxone, protein supplement shake, sodium chloride  Physical Exam         Awake  More alert Denies pain S1 S2 Regular clear breath sounds Has ostomy No edema Generalized weakness, appears pale, appears chronically ill.   Vital Signs: BP (!) 115/49 (BP Location: Right Arm)   Pulse 93   Temp  97.6 F (36.4 C) (Oral)   Resp 12   Wt 91.2 kg (201 lb)   SpO2 95%   BMI 36.76 kg/m  SpO2: SpO2: 95 % O2 Device: O2 Device: Room Air O2 Flow Rate: O2 Flow Rate (L/min): 4 L/min  Intake/output summary:   Intake/Output Summary (Last 24 hours) at 10/04/2017 1303 Last data filed at 10/04/2017 1235 Gross per 24 hour  Intake 4689.17 ml  Output 2595 ml  Net 2094.17 ml   LBM: Last BM Date: 10/03/17 Baseline Weight: Weight: 89.7 kg (197 lb 12 oz) Most recent weight: Weight: 91.2 kg (201 lb)       Palliative Assessment/Data:    Flowsheet Rows     Most Recent Value  Intake Tab  Referral Department  Hospitalist  Unit at Time of Referral  Intermediate Care Unit  Palliative Care Primary Diagnosis  Pain  Date Notified  10/02/17  Palliative Care Type  Return patient Palliative Care  Reason for referral  Pain, Clarify Goals of Care  Date of Admission  10/02/17  Date first seen by Palliative Care  10/03/17  # of days Palliative referral response time  1 Day(s)  # of days IP prior to Palliative referral  0  Clinical Assessment  Palliative Performance Scale Score  30%  Psychosocial & Spiritual Assessment  Palliative Care Outcomes  Patient/Family meeting held?  No      Patient Active Problem List   Diagnosis Date Noted  . Poor venous access   . Acute renal failure superimposed on stage 3 chronic kidney disease ([place]) 10/02/2017  . Decubitus ulcer of sacral region, stage 2 10/02/2017  . Metabolic acidosis 19/50/9326  . MDD (major depressive disorder), single episode, moderate ([place])   . Poorly  controlled diabetes mellitus ([employer]) 08/31/2017  . Pressure injury of skin 08/31/2017  . 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 History of external beam radiation therapy to pelvis 08/31/2017  . ARF (acute renal failure) ([place]) 08/30/2017  . Adjustment disorder with mixed anxiety and depressed mood 08/30/2017  . Dehydration 08/29/2017  . Chronic kidney disease (CKD), stage III (moderate) ([place]) 08/24/2017  . Wound of right buttock 08/10/2017  . 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 Protein-calorie malnutrition, moderate ([place]) 04/18/2017  . Postoperative anemia 04/04/2017  . 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 Hot flashes related to aromatase inhibitor therapy 03/18/2017  . 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 Vitamin D insufficiency 08/30/2016  . Breast cancer of upper-inner quadrant of left female breast ([place])   . Pelvic pain in female 03/19/2015  . Chronic anemia 12/30/2014  . UTI (urinary tract infection)   . Pancytopenia, acquired ([place]) 11/26/2014  . Morbid obesity ([place]) 11/20/2014  . 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 Abdominal pain 09/09/2014  . Postmenopausal bleeding 09/05/2014  . History of ovarian & endometrial cancer 07/17/2012  . 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[place]) 05/10/2012  . Chronic neutropenia ([place]) 12/14/2011  . Primary hypothyroidism 12/14/2011  . DM type 2 (diabetes mellitus, type 2) ([place]) 12/14/2011  . Benign essential HTN 12/14/2011    Palliative Care Assessment & Plan   Patient Profile:    Assessment: Sacral decub +MRSA AKI Urosepsis,Chronic obstructive uropathy/colovesical fistula  encephalopathy Endometrial/ovarian cancer.   Recommendations/Plan:   full  code/full scope treatment.  Continue TDF 25 mcg for now.  Continue PRN IV Fentanyl, especially before hydrotherapy treatments.   PMT to follow periodically.    Code Status:    Code Status Orders  (From admission, onward)        Start     Ordered   10/02/17 1351  Full code  Continuous     10/02/17 1354    Code Status History    Date Active Date Inactive Code Status Order ID Comments User Context   08/29/2017 19:04 09/25/2017 19:38 Full Code [id number]  Michael [name], MD Inpatient   08/26/2017 13:18 08/29/2017 18:31 Full Code [id number]  Michael [name], MD Outpatient   08/25/2017 12:28 08/26/2017 13:18 Full Code [id number]  Michael [name], MD Outpatient   06/20/2017 08:36 08/17/2017 09:58 Full Code [id number]  Michael [name], MD Outpatient   04/16/2017 12:44 04/25/2017 17:05 Full Code [id number]  Excell Seltzer, MD Inpatient   04/02/2017 02:00 04/07/2017 20:54 Full Code [id number]  Michael [name], MD Inpatient   12/30/2014 23:30 01/02/2015 15:38 Full Code [id number]  Rise Patience, MD Inpatient   12/02/2014 16:49 12/12/2014 20:24 Full Code [id number]  Everitt Amber, MD Inpatient   11/19/2014 19:02 11/25/2014 18:43 Full Code [id number]  Michael [name], MD Inpatient       Prognosis:   guarded   Discharge Planning:  To Be Determined  Care plan was discussed with  Patient husband.   Thank you for allowing the Palliative Medicine Team to assist in the care of this patient.   Time In: 12.30 Time Out: 12.55 Total Time 25 Prolonged Time Billed  no       Greater than 50%  of this time was spent counseling and coordinating care related to the above assessment and plan.  Loistine Chance, MD [phone]  Please contact Palliative Medicine Team phone at [phone] for questions and concerns.

## 2017-10-04 NOTE — Progress Notes (Signed)
Physical Therapy Wound Treatment Patient Details  Name: [name] MRN: [id number] Date of Birth: February 14, 1956  Today's Date: 10/04/2017 Time: 8756-4332 Time Calculation (min): 42 min  Subjective  Subjective: Can my husband stay  Patient and Family Stated Goals: to heal   Pain Score: Pt premedicated prior to session. Pt with evasive movement throughout pulsed lavage. Pt husband able to comfort her  Wound Assessment  Pressure Injury 10/02/17 Unstageable - Full thickness tissue loss in which the base of the ulcer is covered by slough (yellow, tan, gray, green or brown) and/or eschar (tan, brown or black) in the wound bed. Sacrum 9 x12  open with yellow wound base puru (Active)  Dressing Type ABD;Barrier Film (skin prep);Moist to dry;Other (Comment) 10/04/2017 12:00 PM  Dressing Clean;Dry;Intact 10/04/2017 12:00 PM  Dressing Change Frequency Daily 10/04/2017 12:00 PM  State of Healing Early/partial granulation 10/04/2017 12:00 PM  Site / Wound Assessment Pink;Yellow;Red;Painful 10/04/2017 12:00 PM  % Wound base Red or Granulating 65% 10/04/2017 12:00 PM  % Wound base Yellow/Fibrinous Exudate 35% 10/04/2017 12:00 PM  Peri-wound Assessment Erythema (blanchable);Maceration 10/04/2017 12:00 PM  Wound Length (cm) 9 cm 10/04/2017 12:00 PM  Wound Width (cm) 12 cm 10/04/2017 12:00 PM  Wound Surface Area (cm^2) 108 cm^2 10/04/2017 12:00 PM  Margins Unattached edges (unapproximated) 10/04/2017 12:00 PM  Drainage Amount Minimal 10/04/2017 12:00 PM  Drainage Description Purulent;Serous 10/04/2017 12:00 PM  Treatment Cleansed;Hydrotherapy (Pulse lavage);Packing (Saline gauze) 10/04/2017 12:00 PM  Santyl applied to wound bed prior to dressing.   Hydrotherapy Pulsed lavage therapy - wound location: sacrum Pulsed Lavage with Suction (psi): 4 psi(4-8 psi due to pain) Pulsed Lavage with Suction - Normal Saline Used: 1000 mL Pulsed Lavage Tip: Tip with splash shield   Wound Assessment and Plan  Wound Therapy -  Assess/Plan/Recommendations Wound Therapy - Clinical Statement: Pt will benefit from hydrotherapy and selective debridement of nonviable tissue to decreased bioburden and promote wound bed healing.  Wound Therapy - Functional Problem List: Decreased sitting tolerance Factors Delaying/Impairing Wound Healing: Diabetes Mellitus;Immobility Hydrotherapy Plan: Debridement;Dressing change;Patient/family education;Pulsatile lavage with suction Wound Therapy - Frequency: 6X / week Wound Therapy - Follow Up Recommendations: Skilled nursing facility Wound Plan: see above  Wound Therapy Goals- Improve the function of patient's integumentary system by progressing the wound(s) through the phases of wound healing (inflammation - proliferation - remodeling) by: Decrease Necrotic Tissue to: 10 Increase Granulation Tissue to: 90 Goals/treatment plan/discharge plan were made with and agreed upon by patient/family: Yes Time For Goal Achievement: 7 days Wound Therapy - Potential for Goals: Good  Goals will be updated until maximal potential achieved or discharge criteria met.  Discharge criteria: when goals achieved, discharge from hospital, MD decision/surgical intervention, no progress towards goals, refusal/missing three consecutive treatments without notification or medical reason.  GP    Dani Gobble. Migdalia Dk PT, DPT Acute Rehabilitation  [phone] Pager [phone]   [place] 10/04/2017, 12:28 PM

## 2017-10-04 NOTE — Progress Notes (Signed)
PROGRESS NOTE    [name]  TDV:[id number] DOB: 1955-11-04 DOA: 10/02/2017 PCP: Ann Held, DO   Brief Narrative:62 y.o.femalewith medical history significant ofendometrial cancer in the past with a recurrence in 2016. She underwent a complex hysterectomy and had a right ureteral stricture and injury requiring chronic stent placement at that time. In August 2018 she underwent bursal of her colostomy and construction of her ureter. At the time the bladder seem to function normal but over the ensuing months it was determined that the patient had a bladder leak. 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 In an attempt to allow the fistula to heal itself in February the patient had bilateral nephrostomy tubes placed. A Foley catheter was also placed. She was discharged February 23 to a skilled nursing facility for continued care. Having a great deal of pain and palliative care saw her for pain management. At the time she discharged on fentanyl patches as well as oral Dilaudid. She has continued to have a great deal of pain on her left flank. Today she remains very tender on the left flank.  She presents today with delirium and significant worsening of her renal function. Evaluation both by physical exam and CT scan revealed that the nephrostomy tube on the left is no longer in position and that there is some left-sided mild hydronephrosis with fluid collection. The left proximal ureter is distended in the distal ureter is not well-defined. The previously visualized presacral fluid collection is no longer discretely visualized. There is some soft tissue attenuation in that space. Additionally she has new lower lobe airspace opacities at the lungs perhaps consistent with pneumonia. The patient is able to give very little history her speech is very difficult to understand she is uncomfortable, she is  moaning and groaning. Her husband is appropriately frustrated at her lack of improvement despite extensive amounts of medical care. He reiterates to me that her cancer is completely gone but she does not appear to be getting better. Most of the history is obtained from the medical record, the patient's husband and the ER physician. The patient is really not in any condition to give any cogent history.   She will be admitted into the hospital for further evaluation and management of acute on chronic kidney disease, delirium, possible UTI, possible pneumonia and further evaluation and management of her colovesicular fistula which has recently acquired placement of nephrostomy tubes,consult with urology, consult with nephrology, consult with infectious disease, and consult with palliative care. Patient will need a holistic team based approach to her management  10/04/2017 patient appears to be much more comfortable today she is awake and she is able to communicate.  Her pain is under control.  Discussed with husband yesterday.    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 Acute  renal failure superimposed on stage III chronic kidney disease:Suspect this is due to new onset renal failure likely due to poor clearance of urine due to nephrostomy tube being out of position.  Patient  had successful left-sided nephrostomy tube replacement and right-sided exchange and upsizing of the nephrostomy tube to 12 [place].We will continue to monitor renal function closely and avoid nephrotoxic agents and hydrate judiciously.  Continue bicarb drip per renal.  Renal functions improving.  2] infected stent status post replacement by interventional radiology.  Urine culture growing MRSA.  Currently on daptomycin.  ID following.  3. 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  Urology following.  4. Metabolic acidosis: Likely related to acute renal failure.   5. Chronic neutropenia: Patient takes Neupogen for life every 6 days.  6. Ureteral stricture on the right status post resection/reimplantation into the bladder (Heineke -Mikuliczwith psoas hitch) 04/01/2017  urology following.  7. Protein calorie malnutrition moderate: We will consult nutritionist as well as continue supplemental products.  8. Wound of right buttock: Follow wound care recommendations.  9. Decubitus ulcer of sacral region stage II: Follow wound care recommendations.    10. Primary hypothyroidism noted continue home levothyroxine dose.  11. Diabetes type 2: She is currently on sliding scale.  Takes Lantus at home.  Her intake is minimal at this time.  Restart Lantus when appropriate.    12. Benign essential hypertension: Continue home medications.  13. History of ovarian and endometrial cancer: Patient is currently cancer free all of her problems arise from complications due to the very complicated surgery she had in 2016 and in August 2018  14] anemia of chronic disease hemoglobin dropped to 7.3 will check fecal occult blood and arrange for blood transfusion.       DVT prophylaxis: Lovenox Code Status full code Family Communication discussed with husband  yesterday Disposition Plan she is not ready to be discharged  Yet.  She has multiple consultants following her.  She was admitted on 10/02/2017.  Consultants:   Procedures: Antimicrobials: Subjective:  Objective: Vitals:   10/04/17 0100 10/04/17 0300 10/04/17 0425 10/04/17 0822  BP: (!) 108/57 (!) 107/49  (!) 115/49  Pulse: 76 68  93  Resp: 13 15  12   Temp: 98.3 F (36.8 C) 98 F (36.7 C)  97.6 F (36.4 C)  TempSrc: Oral Oral  Oral  SpO2: 94% 96%  95%  Weight:   91.2 kg (201 lb)     Intake/Output Summary (Last 24 hours) at 10/04/2017 1048 Last data filed at 10/04/2017 0854 Gross per 24 hour  Intake 4689.17 ml  Output 2195 ml  Net 2494.17 ml   Filed Weights   10/02/17 1857 10/04/17 0425  Weight: 89.7 kg (197 lb 12 oz) 91.2 kg (201 lb)    Examination:  General exam: Appears calm and comfortable  Respiratory system: Clear to auscultation. Respiratory effort normal. Cardiovascular system: S1 & S2 heard, RRR. No JVD, murmurs, rubs, gallops or clicks. No pedal edema. Gastrointestinal system: Abdomen is nondistended, soft and nontender. No organomegaly or masses felt. Normal bowel sounds heard. Central nervous system: Alert and oriented. No focal neurological deficits. Extremities: Symmetric 5 x 5 power. Skin: No rashes, lesions or ulcers Psychiatry: Judgement and insight appear normal. Mood & affect appropriate.     Data Reviewed: I have personally reviewed following labs and imaging studies  CBC: Recent Labs  Lab 10/02/17 0729  10/03/17 0253 10/04/17 0220  WBC 2.9* 4.5 3.2*  NEUTROABS 1.5*  --   --   HGB 9.9* 8.3* 7.3*  HCT 32.2* 27.2* 23.6*  MCV 94.7 92.5 90.1  PLT 387 332 588   Basic Metabolic Panel: Recent Labs  Lab 10/02/17 0729 10/02/17 2221 10/03/17 0253 10/04/17 0220  NA 136  --  141 145  K 4.0  --  3.6 2.4*  CL 109  --  112* 106  CO2 14*  --  16* 27  GLUCOSE 123*  --  155* 83  BUN 52*  --  52* 49*  CREATININE 3.74*  --  3.27* 2.78*  CALCIUM  9.2  --  8.5* 8.1*  MG  --  1.1*  --   --   PHOS  --   --  5.3* 3.6   GFR: Estimated Creatinine Clearance: 21.7 mL/min (A) (by C-G formula based on SCr of 2.78 mg/dL (H)). Liver Function Tests: Recent Labs  Lab 10/02/17 0729 10/03/17 0253 10/04/17 0220  AST 15  --  18  ALT 20  --  19  ALKPHOS 248*  --  226*  BILITOT 0.4  --  0.7  PROT 7.7  --  5.6*  ALBUMIN 1.9* 1.5* 1.5*   No results for input(s): LIPASE, AMYLASE in the last 168 hours. No results for input(s): AMMONIA in the last 168 hours. Coagulation Profile: Recent Labs  Lab 10/02/17 2221  INR 1.34   Cardiac Enzymes: Recent Labs  Lab 10/02/17 2221 10/03/17 0253  CKTOTAL 16* 18*   BNP (last 3 results) No results for input(s): PROBNP in the last 8760 hours. HbA1C: No results for input(s): HGBA1C in the last 72 hours. CBG: Recent Labs  Lab 10/02/17 2043 10/03/17 0834 10/03/17 1140 10/03/17 1622 10/04/17 0824  GLUCAP 141* 119* 94 93 72   Lipid Profile: No results for input(s): CHOL, HDL, LDLCALC, TRIG, CHOLHDL, LDLDIRECT in the last 72 hours. Thyroid Function Tests: No results for input(s): TSH, T4TOTAL, FREET4, T3FREE, THYROIDAB in the last 72 hours. Anemia Panel: No results for input(s): VITAMINB12, FOLATE, FERRITIN, TIBC, IRON, RETICCTPCT in the last 72 hours. Sepsis Labs: Recent Labs  Lab 10/02/17 1231  LATICACIDVEN 0.9    Recent Results (from the past 240 hour(s))  Blood culture (routine x 2)     Status: None (Preliminary result)   Collection Time: 10/02/17  7:00 AM  Result Value Ref Range Status   Specimen Description BLOOD LEFT ANTECUBITAL  Final   Special Requests IN PEDIATRIC BOTTLE Blood Culture adequate volume  Final   Culture   Final    NO GROWTH 1 DAY Performed at [facility], [address] [place], [place] [address]    Report Status PENDING  Incomplete  Blood culture (routine x 2)     Status: None (Preliminary result)   Collection Time: 10/02/17  7:25 AM  Result Value  Ref Range Status   Specimen Description BLOOD LEFT HAND  Final   Special Requests IN PEDIATRIC BOTTLE Blood Culture adequate volume  Final   Culture   Final    NO GROWTH 1 DAY Performed at [facility], [place] [address], [place], [place] [address]    Report Status PENDING  Incomplete  Urine culture     Status: Abnormal   Collection Time: 10/02/17  8:39 AM  Result Value Ref Range Status   Specimen Description URINE, RANDOM  Final   Special Requests   Final    NONE Performed at  [facility], [place] [address] [place], [place] [address]    Culture MULTIPLE SPECIES PRESENT, SUGGEST RECOLLECTION (A)  Final   Report Status 10/04/2017 FINAL  Final  Urine culture     Status: Abnormal   Collection Time: 10/02/17  2:20 PM  Result Value Ref Range Status   Specimen Description URINE, CATHETERIZED  Final   Special Requests   Final    NONE Performed at [facility], [place] [address], [place], [place] [address]    Culture (A)  Final    20,000 COLONIES/mL METHICILLIN RESISTANT STAPHYLOCOCCUS AUREUS   Report Status 10/04/2017 FINAL  Final   Organism ID, Bacteria METHICILLIN RESISTANT STAPHYLOCOCCUS AUREUS (A)  Final      Susceptibility   Methicillin resistant staphylococcus aureus - MIC*    CIPROFLOXACIN >=8 RESISTANT Resistant     GENTAMICIN <=0.5 SENSITIVE Sensitive     NITROFURANTOIN <=16 SENSITIVE Sensitive     OXACILLIN >=4 RESISTANT Resistant     TETRACYCLINE <=1 SENSITIVE Sensitive     VANCOMYCIN <=0.5 SENSITIVE Sensitive     TRIMETH/SULFA >=320 RESISTANT Resistant     CLINDAMYCIN <=0.25 SENSITIVE Sensitive     RIFAMPIN <=0.5 SENSITIVE Sensitive     Inducible Clindamycin NEGATIVE Sensitive     * 20,000 COLONIES/mL METHICILLIN RESISTANT STAPHYLOCOCCUS AUREUS  Urine Culture     Status: Abnormal   Collection Time: 10/02/17  6:31 PM  Result Value Ref Range Status   Specimen Description KIDNEY RIGHT  Final   Special Requests   Final    NONE Performed at [facility], [place] [address], [place], [place] [address]    Culture (A)  Final    50,000 COLONIES/mL METHICILLIN RESISTANT STAPHYLOCOCCUS AUREUS   Report Status 10/04/2017 FINAL  Final   Organism ID, Bacteria METHICILLIN RESISTANT STAPHYLOCOCCUS AUREUS (A)  Final      Susceptibility   Methicillin resistant staphylococcus aureus - MIC*    CIPROFLOXACIN >=8 RESISTANT Resistant     ERYTHROMYCIN >=8 RESISTANT Resistant     GENTAMICIN <=0.5 SENSITIVE Sensitive     OXACILLIN >=4 RESISTANT Resistant     TETRACYCLINE <=1 SENSITIVE Sensitive     VANCOMYCIN 1 SENSITIVE Sensitive     TRIMETH/SULFA >=320 RESISTANT Resistant     CLINDAMYCIN <=0.25 SENSITIVE Sensitive     RIFAMPIN <=0.5 SENSITIVE Sensitive     Inducible Clindamycin NEGATIVE Sensitive     * 50,000 COLONIES/mL METHICILLIN RESISTANT STAPHYLOCOCCUS AUREUS  MRSA PCR Screening     Status: Abnormal   Collection Time: 10/02/17  6:54 PM  Result Value Ref Range Status   MRSA by PCR POSITIVE (A) NEGATIVE Final    Comment:        The GeneXpert MRSA Assay (FDA approved for NASAL specimens only), is one component of a comprehensive MRSA colonization surveillance program. It is not intended to diagnose MRSA infection nor to guide or monitor treatment for MRSA infections. RESULT CALLED TO, READ BACK BY AND VERIFIED WITH: TDECAREAUX,RN @2050  10/02/17 BY LHOWARD Performed at [facility], [place] [address], [place], [place] [address]   Urine culture     Status: Abnormal   Collection Time: 10/02/17  7:35 PM  Result Value Ref Range Status   Specimen Description KIDNEY RIGHT  Final   Special Requests   Final    Normal Performed at [facility], [place] [address], [place],  [address]    Culture (A)  Final    >=100,000 COLONIES/mL METHICILLIN RESISTANT  STAPHYLOCOCCUS AUREUS   Report Status 10/04/2017 FINAL  Final   Organism ID, Bacteria METHICILLIN RESISTANT STAPHYLOCOCCUS AUREUS (A)  Final      Susceptibility   Methicillin  resistant staphylococcus aureus - MIC*    CIPROFLOXACIN >=8 RESISTANT Resistant     ERYTHROMYCIN >=8 RESISTANT Resistant     GENTAMICIN <=0.5 SENSITIVE Sensitive     OXACILLIN >=4 RESISTANT Resistant     TETRACYCLINE <=1 SENSITIVE Sensitive     VANCOMYCIN 1 SENSITIVE Sensitive     TRIMETH/SULFA >=320 RESISTANT Resistant     CLINDAMYCIN <=0.25 SENSITIVE Sensitive     RIFAMPIN <=0.5 SENSITIVE Sensitive     Inducible Clindamycin NEGATIVE Sensitive     * >=100,000 COLONIES/mL METHICILLIN RESISTANT STAPHYLOCOCCUS AUREUS         Radiology Studies: Ct Head Wo Contrast  Result Date: 10/02/2017 CLINICAL DATA:  Altered mental status.  Nonverbal. EXAM: CT HEAD WITHOUT CONTRAST TECHNIQUE: Contiguous axial images were obtained from the base of the skull through the vertex without intravenous contrast. COMPARISON:  None. FINDINGS: Brain: No evidence of acute infarction, hemorrhage, hydrocephalus, extra-axial collection or mass lesion/mass effect. Vascular: No hyperdense vessel or unexpected calcification. Skull: Normal. Negative for fracture or focal lesion. Sinuses/Orbits: Globes and orbits are unremarkable. There is a small amount of dependent fluid in the right sphenoid sinus. Sinuses otherwise clear. Clear mastoid air cells. Other: None. IMPRESSION: No intracranial abnormality. Electronically Signed   By: Lajean Manes M.D.   On: 10/02/2017 12:04   Dg Chest Port 1 View  Result Date: 10/03/2017 CLINICAL DATA:  Central line placement. EXAM: PORTABLE CHEST 1 VIEW COMPARISON:  10/02/2017 FINDINGS: Left internal jugular central venous line has its tip projecting at the caval atrial junction. No pneumothorax. Cardiac silhouette is normal in size. No mediastinal or hilar masses. Clear lungs. IMPRESSION: 1. Left internal jugular central venous line is well positioned, tip projecting at the caval atrial junction. No pneumothorax. 2. No acute cardiopulmonary disease. Electronically Signed   By: Lajean Manes M.D.    On: 10/03/2017 12:17   Ir Nephrostomy Placement Left  Result Date: 10/02/2017 INDICATION: History of endometrial cancer complicated by development of bilateral ureteral obstruction, post bilateral percutaneous nephrostomy catheter placement on 09/02/2017. Unfortunately, the patient has now been admitted with interval removal of left-sided nephrostomy catheter, as well as findings worrisome for impending urosepsis with markedly cloudy urine within the right nephrostomy. As such, request made for image guided replacement of left-sided nephrostomy catheter as well as right-sided nephrostomy catheter exchange and up sizing. EXAM: 1. FLUOROSCOPIC GUIDED REPLACEMENT OF LEFT-SIDED NEPHROSTOMY CATHETER 2. FLUOROSCOPIC GUIDED EXCHANGE OF RIGHT-SIDED NEPHROSTOMY CATHETER EXCHANGE COMPARISON:  CT abdomen and pelvis - 10/02/2017; 09/22/2017; ultrasound and fluoroscopic guided bilateral percutaneous nephrostomy catheter placement - 09/02/2017 CONTRAST:  A total of 20 mL Isovue-300 administered was administered into both collecting systems FLUOROSCOPY TIME:  4 minute 18 seconds (88 mGy) COMPLICATIONS: None immediate. TECHNIQUE: Informed written consent was obtained from the the patient's family after a discussion of the risks, benefits and alternatives to treatment. Questions regarding the procedure were encouraged and answered. A timeout was performed prior to the initiation of the procedure. The bilateral flanks and external portions of the existing right-sided percutaneous nephrostomy catheter and entrance site of inadvertently removed left-sided nephrostomy catheter were prepped and draped in the usual sterile fashion. A sterile drape was applied covering the operative field. Maximum barrier sterile technique with sterile gowns and gloves were used for the procedure. A timeout was performed prior to the  initiation of the procedure. A pre procedural spot fluoroscopic image was obtained. The entrance site of the  inadvertently removed left-sided nephrostomy catheter was cannulated with a Christmas tree adapter. Contrast injection was performed. With the use of a regular glidewire, a Kumpe catheter was advanced through the subcutaneous tract to the level of the left renal pelvis. Contrast injection confirmed appropriate positioning. Next, over a short Amplatz wire, a new 63 French percutaneous drainage catheter was advanced through the existing nephrostomy catheter track with end ultimately coiled and locked within the left renal pelvis. Contrast injection confirmed appropriate positioning. A sample of urine was aspirated, capped and sent to the laboratory for analysis. Attention was now paid towards the exchange and up sizing of the right-sided nephrostomy catheter. Contrast injection confirmed appropriate positioning and functionality of existing right-sided nephrostomy catheter. A small amount of urine was aspirated from the existing right-sided nephrostomy catheter, capped and sent to the laboratory for analysis. Next, the external portion of the nephrostomy catheter was cut and cannulated with a short Amplatz wire which was coiled within the right renal pelvis. Under intermittent fluoroscopic guidance, the existing 10 French nephrostomy catheter was exchanged for a new, slightly larger now 12 French nephrostomy catheter with end ultimately coiled and locked within the right renal pelvis. Contrast injection confirmed appropriate positioning. Both nephrostomy catheters were secured at the skin entrance site with 2 interrupted sutures and StatLock devices. Both nephrostomy catheters were reconnected to gravity bags. Dressings were placed. The patient tolerated the above procedures well without immediate postprocedural complication. FINDINGS: Subcutaneous tract injection demonstrated persistent patency of the left-sided percutaneous nephrostomy catheter tract. Successful fluoroscopic guided recanalization of the left-sided  nephrostomy catheter track allowing placement of a new 12 French percutaneous drainage catheter with end coiled end locked within the left renal pelvis. Successful fluoroscopic guided exchange and up sizing of new now 12 French right-sided percutaneous nephrostomy catheter with end coiled and locked within the right renal pelvis. IMPRESSION: 1. Successful fluoroscopic guided recanalization of the left-sided nephrostomy catheter track allowing placement of a new 12 French percutaneous drainage catheter with and coiled end locked within the left renal pelvis. 2. Successful fluoroscopic guided exchange and up sizing of new now 85 French right-sided percutaneous nephrostomy catheter with end coiled and locked within the right renal pelvis. 3. Urine sample capped and sent to the laboratory for analysis. Electronically Signed   By: Sandi Mariscal M.D.   On: 10/02/2017 18:29   Ir Nephrostomy Exchange Right  Result Date: 10/02/2017 INDICATION: History of endometrial cancer complicated by development of bilateral ureteral obstruction, post bilateral percutaneous nephrostomy catheter placement on 09/02/2017. Unfortunately, the patient has now been admitted with interval removal of left-sided nephrostomy catheter, as well as findings worrisome for impending urosepsis with markedly cloudy urine within the right nephrostomy. As such, request made for image guided replacement of left-sided nephrostomy catheter as well as right-sided nephrostomy catheter exchange and up sizing. EXAM: 1. FLUOROSCOPIC GUIDED REPLACEMENT OF LEFT-SIDED NEPHROSTOMY CATHETER 2. FLUOROSCOPIC GUIDED EXCHANGE OF RIGHT-SIDED NEPHROSTOMY CATHETER EXCHANGE COMPARISON:  CT abdomen and pelvis - 10/02/2017; 09/22/2017; ultrasound and fluoroscopic guided bilateral percutaneous nephrostomy catheter placement - 09/02/2017 CONTRAST:  A total of 20 mL Isovue-300 administered was administered into both collecting systems FLUOROSCOPY TIME:  4 minute 18 seconds (88 mGy)  COMPLICATIONS: None immediate. TECHNIQUE: Informed written consent was obtained from the the patient's family after a discussion of the risks, benefits and alternatives to treatment. Questions regarding the procedure were encouraged and answered. A timeout was  performed prior to the initiation of the procedure. The bilateral flanks and external portions of the existing right-sided percutaneous nephrostomy catheter and entrance site of inadvertently removed left-sided nephrostomy catheter were prepped and draped in the usual sterile fashion. A sterile drape was applied covering the operative field. Maximum barrier sterile technique with sterile gowns and gloves were used for the procedure. A timeout was performed prior to the initiation of the procedure. A pre procedural spot fluoroscopic image was obtained. The entrance site of the inadvertently removed left-sided nephrostomy catheter was cannulated with a Christmas tree adapter. Contrast injection was performed. With the use of a regular glidewire, a Kumpe catheter was advanced through the subcutaneous tract to the level of the left renal pelvis. Contrast injection confirmed appropriate positioning. Next, over a short Amplatz wire, a new 10 French percutaneous drainage catheter was advanced through the existing nephrostomy catheter track with end ultimately coiled and locked within the left renal pelvis. Contrast injection confirmed appropriate positioning. A sample of urine was aspirated, capped and sent to the laboratory for analysis. Attention was now paid towards the exchange and up sizing of the right-sided nephrostomy catheter. Contrast injection confirmed appropriate positioning and functionality of existing right-sided nephrostomy catheter. A small amount of urine was aspirated from the existing right-sided nephrostomy catheter, capped and sent to the laboratory for analysis. Next, the external portion of the nephrostomy catheter was cut and cannulated with  a short Amplatz wire which was coiled within the right renal pelvis. Under intermittent fluoroscopic guidance, the existing 10 French nephrostomy catheter was exchanged for a new, slightly larger now 12 French nephrostomy catheter with end ultimately coiled and locked within the right renal pelvis. Contrast injection confirmed appropriate positioning. Both nephrostomy catheters were secured at the skin entrance site with 2 interrupted sutures and StatLock devices. Both nephrostomy catheters were reconnected to gravity bags. Dressings were placed. The patient tolerated the above procedures well without immediate postprocedural complication. FINDINGS: Subcutaneous tract injection demonstrated persistent patency of the left-sided percutaneous nephrostomy catheter tract. Successful fluoroscopic guided recanalization of the left-sided nephrostomy catheter track allowing placement of a new 12 French percutaneous drainage catheter with end coiled end locked within the left renal pelvis. Successful fluoroscopic guided exchange and up sizing of new now 64 French right-sided percutaneous nephrostomy catheter with end coiled and locked within the right renal pelvis. IMPRESSION: 1. Successful fluoroscopic guided recanalization of the left-sided nephrostomy catheter track allowing placement of a new 12 French percutaneous drainage catheter with and coiled end locked within the left renal pelvis. 2. Successful fluoroscopic guided exchange and up sizing of new now 19 French right-sided percutaneous nephrostomy catheter with end coiled and locked within the right renal pelvis. 3. Urine sample capped and sent to the laboratory for analysis. Electronically Signed   By: Sandi Mariscal M.D.   On: 10/02/2017 18:29        Scheduled Meds: . artificial tears  1 application Both Eyes QHS  . chlorhexidine  15 mL Mouth Rinse BID  . Chlorhexidine Gluconate Cloth  6 each Topical Q0600  . collagenase   Topical Daily  . enoxaparin  (LOVENOX) injection  30 mg Subcutaneous Q24H  . feeding supplement (PRO-STAT SUGAR FREE 64)  30 mL Oral TID  . fentaNYL  25 mcg Transdermal Q72H  . insulin aspart  2-12 Units Subcutaneous TID WC  . insulin aspart  7 Units Subcutaneous TID WC  . levothyroxine  150 mcg Oral QAC breakfast  . mouth rinse  15 mL Mouth  Rinse q12n4p  . multivitamin with minerals  1 tablet Oral Daily  . mupirocin ointment  1 application Nasal BID  . polyvinyl alcohol  1 drop Both Eyes QHS  . pregabalin  50 mg Oral BID  . psyllium  1 packet Oral BID  . sodium chloride flush  3 mL Intravenous Q12H  . sodium chloride flush  5 mL Intracatheter Q8H  . Tbo-Filgrastim  480 mcg Subcutaneous 6 days   Continuous Infusions: . DAPTOmycin (CUBICIN)  IV Stopped (10/03/17 1622)  . magnesium sulfate 1 - 4 g bolus IVPB 2 g (10/04/17 0941)  . potassium chloride    . sodium chloride 0.9 % 1,000 mL infusion       LOS: 2 days    Georgette Shell, MD [facility]  If 7PM-7AM, please contact night-coverage [email or web address] Password [facility] 10/04/2017, 10:48 AM

## 2017-10-05 DIAGNOSIS — D709 Neutropenia, unspecified: Secondary | ICD-10-CM

## 2017-10-05 DIAGNOSIS — E872 Acidosis: Secondary | ICD-10-CM

## 2017-10-05 DIAGNOSIS — E1122 Type 2 diabetes mellitus with diabetic chronic kidney disease: Secondary | ICD-10-CM

## 2017-10-05 DIAGNOSIS — N321 Vesicointestinal fistula: Secondary | ICD-10-CM

## 2017-10-05 DIAGNOSIS — E039 Hypothyroidism, unspecified: Secondary | ICD-10-CM

## 2017-10-05 DIAGNOSIS — I10 Essential (primary) hypertension: Secondary | ICD-10-CM

## 2017-10-05 DIAGNOSIS — N135 Crossing vessel and stricture of ureter without hydronephrosis: Secondary | ICD-10-CM

## 2017-10-05 DIAGNOSIS — R1032 Left lower quadrant pain: Secondary | ICD-10-CM

## 2017-10-05 DIAGNOSIS — A419 Sepsis, unspecified organism: Secondary | ICD-10-CM

## 2017-10-05 LAB — GLUCOSE, CAPILLARY
GLUCOSE-CAPILLARY: 147 mg/dL — AB (ref 65–99)
Glucose-Capillary: 130 mg/dL — ABNORMAL HIGH (ref 65–99)
Glucose-Capillary: 114 mg/dL — ABNORMAL HIGH (ref 65–99)
Glucose-Capillary: 153 mg/dL — ABNORMAL HIGH (ref 65–99)

## 2017-10-05 LAB — CBC
HCT: 29.7 % — ABNORMAL LOW (ref 36.0–46.0)
Hemoglobin: 9.1 g/dL — ABNORMAL LOW (ref 12.0–15.0)
MCH: 28.5 pg (ref 26.0–34.0)
MCHC: 30.6 g/dL (ref 30.0–36.0)
MCV: 93.1 fL (ref 78.0–100.0)
PLATELETS: 359 10*3/uL (ref 150–400)
RBC: 3.19 MIL/uL — AB (ref 3.87–5.11)
RDW: 16.7 % — ABNORMAL HIGH (ref 11.5–15.5)
WBC: 36 10*3/uL — ABNORMAL HIGH (ref 4.0–10.5)

## 2017-10-05 LAB — TYPE AND SCREEN
ABO/RH(D): A POS
ANTIBODY SCREEN: NEGATIVE
Unit division: 0

## 2017-10-05 LAB — BPAM RBC
BLOOD PRODUCT EXPIRATION DATE: 201903252359
ISSUE DATE / TIME: 201903051439
Unit Type and Rh: 6200

## 2017-10-05 LAB — RENAL FUNCTION PANEL
Albumin: 1.6 g/dL — ABNORMAL LOW (ref 3.5–5.0)
Anion gap: 14 (ref 5–15)
BUN: 38 mg/dL — ABNORMAL HIGH (ref 6–20)
CALCIUM: 8.1 mg/dL — AB (ref 8.9–10.3)
CO2: 29 mmol/L (ref 22–32)
CREATININE: 2.11 mg/dL — AB (ref 0.44–1.00)
Chloride: 104 mmol/L (ref 101–111)
GFR calc Af Amer: 28 mL/min — ABNORMAL LOW (ref >60)
GFR calc non Af Amer: 24 mL/min — ABNORMAL LOW (ref >60)
GLUCOSE: 126 mg/dL — AB (ref 65–99)
Phosphorus: 2.1 mg/dL — ABNORMAL LOW (ref 2.5–4.6)
Potassium: 2.8 mmol/L — ABNORMAL LOW (ref 3.5–5.1)
SODIUM: 147 mmol/L — AB (ref 135–145)

## 2017-10-05 LAB — MAGNESIUM: MAGNESIUM: 1.5 mg/dL — AB (ref 1.7–2.4)

## 2017-10-05 MED ORDER — POTASSIUM CHLORIDE 10 MEQ/50ML IV SOLN
10.0000 meq | INTRAVENOUS | Status: AC
  Administered 2017-10-05 (×4): 10 meq via INTRAVENOUS
  Filled 2017-10-05 (×4): qty 50

## 2017-10-05 MED ORDER — SODIUM CHLORIDE 0.9% FLUSH: 10.0000 mL | INTRAVENOUS | Status: DC | PRN

## 2017-10-05 MED ORDER — SODIUM CHLORIDE 0.9% FLUSH
10.0000 mL | Freq: Two times a day (BID) | INTRAVENOUS | Status: DC
  Administered 2017-10-05 – 2017-10-10 (×9): 10 mL
  Administered 2017-10-11: 20 mL
  Administered 2017-10-11: 30 mL
  Administered 2017-10-12: 20 mL
  Administered 2017-10-12: 10 mL
  Administered 2017-10-13: 20 mL

## 2017-10-05 NOTE — Progress Notes (Addendum)
PROGRESS NOTE    [name]  PPI:[id number] DOB: May 24, 1956 DOA: 10/02/2017 PCP: Ann Held, DO   Brief Narrative:  62 year old female with history of endometrial cancer, ovarian cancer, diabetes, hypothyroidism, hypertension, chronic kidney disease who presented with worsening delirium and renal function.  Found to have ureteral stent infection with MRSA, placed on daptomycin.  Infectious disease consulted and appreciated, recommending discharge with Jeani Hawking nasal lid twice daily for 2 weeks.  Nephrology also consulted and following.  Neurology consulted and feels patient would need urinary diversion procedure however will be sometime in the future.  Currently pending SNF placement.  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 -Patient had replacement of left PCN-was noted to have purulent urine from exit site. -Right percutaneous tube was exchanged on 10/02/2017 -Creatinine currently -Nephrology consulted and appreciated  Sepsis secondary to MRSA UTI/Stent infection -patient was tachycardic with tachypnea on the day of admission -Patient also found to have neutropenia however that is chronic, currently WBCs are up to 36 -UA showed many bacteria, TNTC WBC, large leukocytes -Blood cultures no growth to date -Urine culture shows MRSA -as noted above, with replacement of left PCN, purulent urine was noted -Infectious disease consulted and appreciated, currently on daptomycin.  Commended use of linezolid for extended 2 weeks at discharge  Hypomagnesemia/hypokalemia -Potassium currently being replaced, will order IV magnesium for replacement -Continue to  monitor  Metabolic acidosis -Please secondary to AKI, patient did require bicarbonate drip which was discontinued  Anemia secondary to chronic disease/iron deficiency -Hemoglobin dropped to 7.3, currently 9.1 (after transfusion) -FOBT ordered -10 you to monitor CBC  Sacral decubitus ulcer -PT currently advising hydrotherapy  Encephalopathy, metabolic -Appears to have improved, suspect likely secondary to infection and acute kidney injury  Metastatic endometrial cancer/chronic neutropenia -Patient takes full gastrin at home  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 Pending SNF when medically stable.   Consultants Infectious disease Nephrology Interventional radiology Urology Palliative care  Procedures  Left PCN replacement and exchange of right PCN interventional radiology  Antibiotics   Anti-infectives (From admission, onward)   Start     Dose/Rate Route Frequency Ordered Stop   10/04/17 1200  vancomycin (VANCOCIN) IVPB 1000 mg/200 mL premix  Status:  Discontinued     1,000 mg 200 mL/hr over 60 Minutes Intravenous Every 48 hours 10/02/17 0903 10/02/17 1539   10/04/17 0800  levofloxacin (LEVAQUIN) IVPB 500 mg  Status:  Discontinued     500 mg 100 mL/hr over 60 Minutes Intravenous Every 48 hours 10/02/17 0903 10/02/17 1539   10/03/17 1600  DAPTOmycin (CUBICIN) 500 mg  in sodium chloride 0.9 % IVPB     500 mg 220 mL/hr over 30 Minutes Intravenous Every 48 hours  10/02/17 1603     10/02/17 2000  aztreonam (AZACTAM) 500 mg in dextrose 5 % 50 mL IVPB  Status:  Discontinued     500 mg 100 mL/hr over 30 Minutes Intravenous Every 8 hours 10/02/17 0903 10/02/17 1539   10/02/17 2000  meropenem (MERREM) 500 mg in sodium chloride 0.9 % 100 mL IVPB  Status:  Discontinued     500 mg 200 mL/hr over 30 Minutes Intravenous Every 12 hours 10/02/17 1603 10/04/17 0906   10/02/17 0915  aztreonam (AZACTAM) 2 g in sodium chloride 0.9 % 100 mL IVPB     2 g 200 mL/hr over 30 Minutes Intravenous  Once 10/02/17 0902 10/02/17 1007   10/02/17 0915  vancomycin (VANCOCIN) 1,500 mg in sodium chloride 0.9 % 500 mL IVPB     1,500 mg 250 mL/hr over 120 Minutes Intravenous  Once 10/02/17 0902 10/02/17 1243   10/02/17 0730  levofloxacin (LEVAQUIN) IVPB 750 mg     750 mg 100 mL/hr over 90 Minutes Intravenous  Once 10/02/17 0723 10/02/17 0915   10/02/17 0730  aztreonam (AZACTAM) 2 g in sodium chloride 0.9 % 100 mL IVPB  Status:  Discontinued     2 g 200 mL/hr over 30 Minutes Intravenous  Once 10/02/17 0723 10/02/17 0902   10/02/17 0730  vancomycin (VANCOCIN) IVPB 1000 mg/200 mL premix  Status:  Discontinued     1,000 mg 200 mL/hr over 60 Minutes Intravenous  Once 10/02/17 0723 10/02/17 0902      Subjective:   [name] Costanza seen and examined today.  Patient has no complaints today.  Denies current chest pain, shortness of breath, abdominal pain, nausea vomiting, dizziness or headache.  Objective:   Vitals:   10/05/17 0400 10/05/17 0630 10/05/17 0700 10/05/17 1109  BP: 131/62  (!) 138/53 127/72  Pulse: 81  70 68  Resp: 18  14 18   Temp: 98.2 F (36.8 C)  98.2 F (36.8 C) (!) 97.3 F (36.3 C)  TempSrc: Oral  Oral Oral  SpO2: 92%  93% 98%  Weight:  89.8 kg (198 lb)      Intake/Output Summary (Last 24 hours) at 10/05/2017 1206 Last data filed at 10/05/2017 0622 Gross per 24 hour   Intake 1480 ml  Output 2300 ml  Net -820 ml   Filed Weights   10/02/17 1857 10/04/17 0425 10/05/17 0630  Weight: 89.7 kg (197 lb 12 oz) 91.2 kg (201 lb) 89.8 kg (198 lb)   Exam  General: Well developed, well nourished, NAD, appears stated age  HEENT: NCAT, mucous membranes moist.   Neck: Supple  Cardiovascular: S1 S2 auscultated, soft SEM. Regular rate and rhythm.  Respiratory: Clear to auscultation bilaterally with equal chest rise  Abdomen: Soft, nontender, nondistended, + bowel sounds  Extremities: warm dry without cyanosis clubbing or edema  Neuro: AAOx3, nonfocal  Psych: Normal affect and demeanor   Data Reviewed: I have personally reviewed following labs and imaging studies  CBC: Recent Labs  Lab 10/02/17 0729 10/03/17 0253 10/04/17 0220 10/05/17 0500  WBC 2.9* 4.5 3.2* 36.0*  NEUTROABS 1.5*  --   --   --   HGB 9.9* 8.3* 7.3* 9.1*  HCT 32.2* 27.2* 23.6* 29.7*  MCV 94.7 92.5 90.1 93.1  PLT 387 332 286 209   Basic Metabolic Panel: Recent Labs  Lab 10/02/17 0729 10/02/17 2221 10/03/17 0253 10/04/17 0220 10/04/17 1041 10/05/17 0500 10/05/17 0533  NA 136  --  141 145  --   --  147*  K 4.0  --  3.6 2.4*  --   --  2.8*  CL 109  --  112* 106  --   --  104  CO2 14*  --  16* 27  --   --  29  GLUCOSE 123*  --  155* 83  --   --  126*  BUN 52*  --  52* 49*  --   --  38*  CREATININE 3.74*  --  3.27* 2.78*  --   --  2.11*  CALCIUM 9.2  --  8.5* 8.1*  --   --  8.1*  MG  --  1.1*  --   --  1.7 1.5*  --   PHOS  --   --  5.3* 3.6  --   --  2.1*   GFR: Estimated Creatinine Clearance: 28.4 mL/min (A) (by C-G formula based on SCr of 2.11 mg/dL (H)). Liver Function Tests: Recent Labs  Lab 10/02/17 0729 10/03/17 0253 10/04/17 0220 10/05/17 0533  AST 15  --  18  --   ALT 20  --  19  --   ALKPHOS 248*  --  226*  --   BILITOT 0.4  --  0.7  --   PROT 7.7  --  5.6*  --   ALBUMIN 1.9* 1.5* 1.5* 1.6*   No results for input(s): LIPASE, AMYLASE in the last 168  hours. No results for input(s): AMMONIA in the last 168 hours. Coagulation Profile: Recent Labs  Lab 10/02/17 2221  INR 1.34   Cardiac Enzymes: Recent Labs  Lab 10/02/17 2221 10/03/17 0253  CKTOTAL 16* 18*   BNP (last 3 results) No results for input(s): PROBNP in the last 8760 hours. HbA1C: No results for input(s): HGBA1C in the last 72 hours. CBG: Recent Labs  Lab 10/04/17 1258 10/04/17 1652 10/04/17 2117 10/05/17 0816 10/05/17 1113  GLUCAP 100* 248* 135* 130* 147*   Lipid Profile: No results for input(s): CHOL, HDL, LDLCALC, TRIG, CHOLHDL, LDLDIRECT in the last 72 hours. Thyroid Function Tests: No results for input(s): TSH, T4TOTAL, FREET4, T3FREE, THYROIDAB in the last 72 hours. Anemia Panel: No results for input(s): VITAMINB12, FOLATE, FERRITIN, TIBC, IRON, RETICCTPCT in the last 72 hours. Urine analysis:    Component Value Date/Time   COLORURINE YELLOW 10/02/2017 0836   APPEARANCEUR CLOUDY (A) 10/02/2017 0836   LABSPEC 1.025 10/02/2017 0836   LABSPEC 1.015 11/17/2015 1436   PHURINE 5.5 10/02/2017 0836   GLUCOSEU NEGATIVE 10/02/2017 0836   GLUCOSEU 2,000 11/17/2015 1436   HGBUR MODERATE (A) 10/02/2017 0836   BILIRUBINUR NEGATIVE 10/02/2017 0836   BILIRUBINUR neg 08/30/2016 1025   BILIRUBINUR Negative 11/17/2015 1436   KETONESUR 15 (A) 10/02/2017 0836   PROTEINUR >300 (A) 10/02/2017 0836   UROBILINOGEN negative 08/30/2016 1025   UROBILINOGEN 0.2 11/17/2015 1436   NITRITE NEGATIVE 10/02/2017 0836   LEUKOCYTESUR LARGE (A) 10/02/2017 0836   LEUKOCYTESUR Moderate 11/17/2015 1436   Sepsis Labs: @LABRCNTIP (procalcitonin:4,lacticidven:4)  ) Recent Results (from the past 240 hour(s))  Blood culture (routine x 2)     Status: None (Preliminary result)   Collection Time: 10/02/17  7:00 AM  Result Value Ref Range Status   Specimen Description BLOOD LEFT ANTECUBITAL  Final   Special Requests IN PEDIATRIC BOTTLE Blood Culture adequate volume  Final   Culture    Final    NO GROWTH 2 DAYS Performed at [facility], [place] [address], [place], [place] [address]    Report Status PENDING  Incomplete  Blood culture (routine x 2)     Status: None (Preliminary result)   Collection Time: 10/02/17  7:25 AM  Result Value Ref Range Status   Specimen Description BLOOD LEFT HAND  Final   Special Requests IN PEDIATRIC BOTTLE Blood Culture adequate volume  Final   Culture   Final    NO GROWTH 2 DAYS Performed at [facility], [place] [address] [place], [place] [address]    Report Status PENDING  Incomplete  Urine culture     Status: Abnormal   Collection Time: 10/02/17  8:39 AM  Result Value Ref Range Status   Specimen Description URINE, RANDOM  Final   Special Requests   Final    NONE Performed at [facility], [place] [address], [place], [place] [address]    Culture MULTIPLE SPECIES PRESENT, SUGGEST RECOLLECTION (A)  Final   Report Status 10/04/2017 FINAL  Final  Urine culture     Status: Abnormal   Collection Time: 10/02/17  2:20 PM  Result Value Ref Range Status   Specimen Description URINE, CATHETERIZED  Final   Special Requests   Final    NONE Performed at [facility], [place] [address] [place], [place] [address]    Culture (A)  Final    20,000 COLONIES/mL METHICILLIN RESISTANT STAPHYLOCOCCUS AUREUS   Report Status 10/04/2017 FINAL  Final   Organism ID, Bacteria METHICILLIN RESISTANT STAPHYLOCOCCUS AUREUS (A)  Final      Susceptibility   Methicillin resistant staphylococcus aureus - MIC*    CIPROFLOXACIN >=8 RESISTANT Resistant     GENTAMICIN <=0.5 SENSITIVE Sensitive     NITROFURANTOIN <=16 SENSITIVE Sensitive     OXACILLIN >=4 RESISTANT Resistant     TETRACYCLINE <=1 SENSITIVE Sensitive     VANCOMYCIN <=0.5 SENSITIVE Sensitive     TRIMETH/SULFA >=320 RESISTANT Resistant     CLINDAMYCIN <=0.25 SENSITIVE Sensitive     RIFAMPIN <=0.5 SENSITIVE Sensitive     Inducible Clindamycin NEGATIVE Sensitive     * 20,000  COLONIES/mL METHICILLIN RESISTANT STAPHYLOCOCCUS AUREUS  Urine Culture     Status: Abnormal   Collection Time: 10/02/17  6:31 PM  Result Value Ref Range Status   Specimen Description KIDNEY RIGHT  Final   Special Requests   Final    NONE Performed at [facility], [place] [address], [place], [place] [address]    Culture (A)  Final    50,000 COLONIES/mL METHICILLIN RESISTANT STAPHYLOCOCCUS AUREUS   Report Status 10/04/2017 FINAL  Final   Organism ID, Bacteria METHICILLIN RESISTANT STAPHYLOCOCCUS AUREUS (A)  Final      Susceptibility   Methicillin resistant staphylococcus aureus - MIC*    CIPROFLOXACIN >=8 RESISTANT Resistant     ERYTHROMYCIN >=8 RESISTANT Resistant     GENTAMICIN <=0.5 SENSITIVE Sensitive     OXACILLIN >=4 RESISTANT Resistant     TETRACYCLINE <=1 SENSITIVE Sensitive     VANCOMYCIN 1 SENSITIVE Sensitive     TRIMETH/SULFA >=320 RESISTANT Resistant     CLINDAMYCIN <=0.25 SENSITIVE Sensitive     RIFAMPIN <=0.5 SENSITIVE Sensitive     Inducible Clindamycin NEGATIVE Sensitive     * 50,000 COLONIES/mL METHICILLIN RESISTANT STAPHYLOCOCCUS AUREUS  MRSA PCR Screening     Status: Abnormal   Collection Time: 10/02/17  6:54 PM  Result Value Ref Range Status   MRSA by PCR POSITIVE (A) NEGATIVE Final    Comment:        The GeneXpert MRSA Assay (FDA  approved for NASAL specimens only), is one component of a comprehensive MRSA colonization surveillance program. It is not intended to diagnose MRSA infection nor to guide or monitor treatment for MRSA infections. RESULT CALLED TO, READ BACK BY AND VERIFIED WITH: TDECAREAUX,RN @2050  10/02/17 BY LHOWARD Performed at [facility], [place] [address] [place], [place] [address]   Urine culture     Status: Abnormal   Collection Time: 10/02/17  7:35 PM  Result Value Ref Range Status   Specimen Description KIDNEY RIGHT  Final   Special Requests   Final    Normal Performed at [facility], [place] [address]  [place], [place] [address]    Culture (A)  Final    >=100,000 COLONIES/mL METHICILLIN RESISTANT STAPHYLOCOCCUS AUREUS   Report Status 10/04/2017 FINAL  Final   Organism ID, Bacteria METHICILLIN RESISTANT STAPHYLOCOCCUS AUREUS (A)  Final      Susceptibility   Methicillin resistant staphylococcus aureus - MIC*    CIPROFLOXACIN >=8 RESISTANT Resistant     ERYTHROMYCIN >=8 RESISTANT Resistant     GENTAMICIN <=0.5 SENSITIVE Sensitive     OXACILLIN >=4 RESISTANT Resistant     TETRACYCLINE <=1 SENSITIVE Sensitive     VANCOMYCIN 1 SENSITIVE Sensitive     TRIMETH/SULFA >=320 RESISTANT Resistant     CLINDAMYCIN <=0.25 SENSITIVE Sensitive     RIFAMPIN <=0.5 SENSITIVE Sensitive     Inducible Clindamycin NEGATIVE Sensitive     * >=100,000 COLONIES/mL METHICILLIN RESISTANT STAPHYLOCOCCUS AUREUS      Radiology Studies: Dg Chest Port 1 View  Result Date: 10/03/2017 CLINICAL DATA:  Central line placement. EXAM: PORTABLE CHEST 1 VIEW COMPARISON:  10/02/2017 FINDINGS: Left internal jugular central venous line has its tip projecting at the caval atrial junction. No pneumothorax. Cardiac silhouette is normal in size. No mediastinal or hilar masses. Clear lungs. IMPRESSION: 1. Left internal jugular central venous line is well positioned, tip projecting at the caval atrial junction. No pneumothorax. 2. No acute cardiopulmonary disease. Electronically Signed   By: Lajean Manes M.D.   On: 10/03/2017 12:17     Scheduled Meds: . artificial tears  1 application Both Eyes QHS  . chlorhexidine  15 mL Mouth Rinse BID  . Chlorhexidine Gluconate Cloth  6 each Topical Q0600  . collagenase   Topical Daily  . enoxaparin (LOVENOX) injection  30 mg Subcutaneous Q24H  . feeding supplement (PRO-STAT SUGAR FREE 64)  30 mL Oral BID BM  . fentaNYL  25 mcg Transdermal Q72H  . insulin aspart  0-9 Units Subcutaneous TID WC  . levothyroxine  150 mcg Oral QAC breakfast  . mouth rinse  15 mL Mouth Rinse q12n4p  . multivitamin with  minerals  1 tablet Oral Daily  . mupirocin ointment  1 application Nasal BID  . polyvinyl alcohol  1 drop Both Eyes QHS  . pregabalin  50 mg Oral BID  . protein supplement shake  11 oz Oral BID BM  . psyllium  1 packet Oral BID  . sodium chloride flush  10-40 mL Intracatheter Q12H  . sodium chloride flush  3 mL Intravenous Q12H  . sodium chloride flush  5 mL Intracatheter Q8H  . Tbo-Filgrastim  480 mcg Subcutaneous 6 days   Continuous Infusions: . DAPTOmycin (CUBICIN)  IV Stopped (10/03/17 1622)  . potassium chloride 10 mEq (10/05/17 1142)  . sodium chloride 0.9 % 1,000 mL infusion 50 mL/hr at 10/05/17 0028     LOS: 3 days   Time Spent in  minutes   45 minutes  Amarii Amy D.O. on 10/05/2017 at 12:06 PM  Between 7am to 7pm - Pager - [phone]  After 7pm go to [email or web address] - password [email or web address]  And look for the night coverage person covering for me after hours  [facility] Office  [phone]

## 2017-10-05 NOTE — Clinical Social Work Note (Signed)
Clinical Social Work Assessment  Patient Details  Name: [name] MRN: [id number] Date of Birth: 06-20-1956  Date of referral:  10/05/17               Reason for consult:  Facility Placement                Permission sought to share information with:  Family Supports Permission granted to share information::  Yes, Verbal Permission Granted  Name::     [name]  Agency::  [name]  Relationship::  spouse  Contact Information:  [phone]  Housing/Transportation Living arrangements for the past 2 months:  [place] of Information:  Patient, Spouse, Siblings Patient Interpreter Needed:  None Criminal Activity/Legal Involvement Pertinent to Current Situation/Hospitalization:  No - Comment as needed Significant Relationships:  Other Family Members, Spouse Lives with:  Spouse, Pets(2 cats) Do you feel safe going back to the place where you live?  Yes Need for family participation in patient care:  Yes (Comment)  Care giving concerns:  Patient had older sister in the room. Patient  Stated she lives at home with spouse and 2 cats. CSW did assessment with patients sister present and spouse on the phone.  Social Worker assessment / plan:  CSW met patient and family at bedside. Patient stated she was at [facility] for a week prior to coming to [facility]. Patient stated her husband paid out of pocket for her stay and he was ale to pay for 30 days. Patient also stated her husband placed a bed hold so she will be able to return once medically cleared. CSW spoke with admissions coordinator Olivia Mackie to verify that patient will have a bed at facility. Olivia Mackie stated patient does have a bed at facility and they will take patient back when ready.   CSW spoke with patients husband via phone to answer question he may have had. [name] stated he was very upset with how patients care and discharge plan was handel at [facility]. [name] stated he was upset  because the CSW at [facility] did not inform him of what was happening and when it came down to her discharge he felt pushed out of the hospital. CSW empathize with the experience patient and family had at [facility] and stated she will try her best to keep family involve in discharge. CSW arranged to meet with patients husband in the morning to answer any question he may have for CSW  Employment status:  Retired Forensic scientist:  Other (Comment Required)(BC/BS) PT Recommendations:  [place] / Referral to community resources:  [place]  Patient/Family's Response to care:  Patient has a lot of support from husband and other family members. Patients husband wanting the best care for patient and also making sure family/patiet are updated on medical plan.   Patient/Family's Understanding of and Emotional Response to Diagnosis, Current Treatment, and Prognosis:  Patient is to return back to facility once medically stable   Emotional Assessment Appearance:  Appears stated age Attitude/Demeanor/Rapport:  Other, Avoidant Affect (typically observed):  Other Orientation:  Oriented to Self, Oriented to Place, Oriented to  Time, Oriented to Situation Alcohol / Substance use:  Not Applicable Psych involvement (Current and /or in the community):  No (Comment)  Discharge Needs  Concerns to be addressed:  No discharge needs identified Readmission within the last 30 days:  Yes Current discharge risk:  None Barriers to Discharge:  Insurance Authorization  Wende Neighbors, LCSW 10/05/2017, 4:42 PM

## 2017-10-05 NOTE — Progress Notes (Signed)
Physical Therapy Wound Treatment Patient Details  Name: Aryiah Monterosso MRN: [id number] Date of Birth: [birth year]  Today's Date: 10/05/2017 Time: 1130-1220 Time Calculation (min): 50 min  Subjective  Subjective: I'm feeling a little better today Patient and Family Stated Goals: to heal   Pain Score: Pt premedicate prior to therapy. Pt reports 6/10 pain.  Wound Assessment  Pressure Injury 10/02/17 Unstageable - Full thickness tissue loss in which the base of the ulcer is covered by slough (yellow, tan, gray, green or brown) and/or eschar (tan, brown or black) in the wound bed. Sacrum 9 x12  open with yellow wound base puru (Active)  Wound Image   10/05/2017  6:00 PM  Dressing Type ABD;Barrier Film (skin prep);Moist to dry;Other (Comment) 10/05/2017  6:00 PM  Dressing Clean;Dry;Intact 10/05/2017  6:00 PM  Dressing Change Frequency Daily 10/05/2017  6:00 PM  State of Healing Early/partial granulation 10/05/2017  6:00 PM  Site / Wound Assessment Pink;Yellow;Red;Painful 10/05/2017  6:00 PM  % Wound base Red or Granulating 65% 10/05/2017  6:00 PM  % Wound base Yellow/Fibrinous Exudate 35% 10/05/2017  6:00 PM  Peri-wound Assessment Erythema (blanchable);Maceration 10/05/2017  6:00 PM  Wound Length (cm) 9 cm 10/04/2017 12:00 PM  Wound Width (cm) 12 cm 10/04/2017 12:00 PM  Wound Surface Area (cm^2) 108 cm^2 10/04/2017 12:00 PM  Margins Unattached edges (unapproximated) 10/05/2017  6:00 PM  Drainage Amount Minimal 10/05/2017  6:00 PM  Drainage Description Purulent;Serosanguineous 10/05/2017  6:00 PM  Treatment Cleansed;Debridement (Selective);Hydrotherapy (Pulse lavage);Packing (Saline gauze) 10/05/2017  6:00 PM   Santyl applied to wound bed    Hydrotherapy Pulsed lavage therapy - wound location: sacrum Pulsed Lavage with Suction (psi): 4 psi(4-8 psi due to pain) Pulsed Lavage with Suction - Normal Saline Used: 1000 mL Pulsed Lavage Tip: Tip with splash shield Selective Debridement Selective Debridement - Location:  sacrum Selective Debridement - Tools Used: Forceps;Scalpel Selective Debridement - Tissue Removed: necrotic, yellow slough   Wound Assessment and Plan  Wound Therapy - Assess/Plan/Recommendations Wound Therapy - Clinical Statement: Pt area of thickest slough began to break down today with hydrotherapy, minor amount of slough debrided and area scored and santyl applied for additional tissue debridement effectiveness.   Wound Therapy - Functional Problem List: Decreased sitting tolerance Factors Delaying/Impairing Wound Healing: Diabetes Mellitus;Immobility Hydrotherapy Plan: Debridement;Dressing change;Patient/family education;Pulsatile lavage with suction Wound Therapy - Frequency: 6X / week Wound Therapy - Follow Up Recommendations: Skilled nursing facility Wound Plan: see above  Wound Therapy Goals- Improve the function of patient's integumentary system by progressing the wound(s) through the phases of wound healing (inflammation - proliferation - remodeling) by: Decrease Necrotic Tissue to: 10 Decrease Necrotic Tissue - Progress: Progressing toward goal Increase Granulation Tissue to: 90 Increase Granulation Tissue - Progress: Progressing toward goal Goals/treatment plan/discharge plan were made with and agreed upon by patient/family: Yes Time For Goal Achievement: 7 days Wound Therapy - Potential for Goals: Good  Goals will be updated until maximal potential achieved or discharge criteria met.  Discharge criteria: when goals achieved, discharge from hospital, MD decision/surgical intervention, no progress towards goals, refusal/missing three consecutive treatments without notification or medical reason.  GP    Dani Gobble. Migdalia Dk PT, DPT Acute Rehabilitation  [phone] Pager [phone]   [place] 10/05/2017, 6:11 PM

## 2017-10-05 NOTE — Progress Notes (Signed)
[facility] Rounding Note  Subjective/Interval History:  Cultures all back + for MRSA Has been changed to Dapto for 2 weeks Will ultimately need a urinary diversion procedure and Dr. Tresa Moore says way down the road Good UOP Large stool output past 24 hours Po not adequate  K low again today Had K runs yesterday Need to find K supplement she can tolerate Renal function at her baseline  Objective Vital signs in last 24 hours: Vitals:   10/04/17 2327 10/05/17 0400 10/05/17 0630 10/05/17 0700  BP: (!) 125/50 131/62  (!) 138/53  Pulse: 67 81  70  Resp: 18 18  14   Temp: (!) 97.5 F (36.4 C) 98.2 F (36.8 C)  98.2 F (36.8 C)  TempSrc: Oral Oral  Oral  SpO2: 92% 92%  93%  Weight:   89.8 kg (198 lb)    Weight change: -1.361 kg ()  Intake/Output Summary (Last 24 hours) at 10/05/2017 0906 Last data filed at 10/05/2017 0622 Gross per 24 hour  Intake 1480 ml  Output 2300 ml  Net -820 ml   Physical Exam:  Blood pressure (!) 138/53, pulse 70, temperature 98.2 F (36.8 C), temperature source Oral, resp. rate 14, weight 89.8 kg (198 lb), SpO2 93 %.   Awake and alert Coarse BS but lungs clear [id number] No S3 1/6 systolic murmur no diastolic murmur Ostomy in place No LE edema PCN sites intact Sacral decub covered (husband showed me photo from last week)    Recent Labs  Lab 10/02/17 0729 10/03/17 0253 10/04/17 0220 10/05/17 0533  NA 136 141 145 147*  K 4.0 3.6 2.4* 2.8*  CL 109 112* 106 104  CO2 14* 16* 27 29  GLUCOSE 123* 155* 83 126*  BUN 52* 52* 49* 38*  CREATININE 3.74* 3.27* 2.78* 2.11*  CALCIUM 9.2 8.5* 8.1* 8.1*  PHOS  --  5.3* 3.6 2.1*    Recent Labs  Lab 10/02/17 0729 10/03/17 0253 10/04/17 0220 10/05/17 0533  AST 15  --  18  --   ALT 20  --  19  --   ALKPHOS 248*  --  226*  --   BILITOT 0.4  --  0.7  --   PROT 7.7  --  5.6*  --   ALBUMIN 1.9* 1.5* 1.5* 1.6*    Recent Labs  Lab 10/02/17 0729 10/03/17 0253 10/04/17 0220  WBC 2.9* 4.5 3.2*  NEUTROABS 1.5*  --    --   HGB 9.9* 8.3* 7.3*  HCT 32.2* 27.2* 23.6*  MCV 94.7 92.5 90.1  PLT 387 332 286   Results for DEVONDA, PEQUIGNOT (MRN [id number]) as of 10/05/2017 09:11  Ref. Range 09/19/2017 04:12 09/20/2017 05:11 09/21/2017 04:11 10/02/2017 22:21 10/04/2017 10:41  Magnesium Latest Ref Range: 1.7 - 2.4 mg/dL 1.6 (L) 1.5 (L) 1.9 1.1 (L) 1.7   Recent Labs  Lab 10/02/17 2221 10/03/17 0253  CKTOTAL 16* 18*   CBG: Recent Labs  Lab 10/04/17 0824 10/04/17 1258 10/04/17 1652 10/04/17 2117 10/05/17 0816  GLUCAP 72 100* 248* 135* 130*   Urine cultures all + MRSA Blood cultures negative to daye  Studies/Results: Dg Chest Port 1 View  Result Date: 10/03/2017 CLINICAL DATA:  Central line placement. EXAM: PORTABLE CHEST 1 VIEW COMPARISON:  10/02/2017 FINDINGS: Left internal jugular central venous line has its tip projecting at the caval atrial junction. No pneumothorax. Cardiac silhouette is normal in size. No mediastinal or hilar masses. Clear lungs. IMPRESSION: 1. Left internal jugular central venous line is well positioned,  tip projecting at the caval atrial junction. No pneumothorax. 2. No acute cardiopulmonary disease. Electronically Signed   By: Lajean Manes M.D.   On: 10/03/2017 12:17   Medications: . DAPTOmycin (CUBICIN)  IV Stopped (10/03/17 1622)  . magnesium sulfate 1 - 4 g bolus IVPB Stopped (10/04/17 1105)  . sodium chloride 0.9 % 1,000 mL infusion 50 mL/hr at 10/05/17 0028   . artificial tears  1 application Both Eyes QHS  . chlorhexidine  15 mL Mouth Rinse BID  . Chlorhexidine Gluconate Cloth  6 each Topical Q0600  . collagenase   Topical Daily  . enoxaparin (LOVENOX) injection  30 mg Subcutaneous Q24H  . feeding supplement (PRO-STAT SUGAR FREE 64)  30 mL Oral BID BM  . fentaNYL  25 mcg Transdermal Q72H  . insulin aspart  0-9 Units Subcutaneous TID WC  . levothyroxine  150 mcg Oral QAC breakfast  . mouth rinse  15 mL Mouth Rinse q12n4p  . multivitamin with minerals  1 tablet Oral Daily  .  mupirocin ointment  1 application Nasal BID  . polyvinyl alcohol  1 drop Both Eyes QHS  . pregabalin  50 mg Oral BID  . protein supplement shake  11 oz Oral BID BM  . psyllium  1 packet Oral BID  . sodium chloride flush  10-40 mL Intracatheter Q12H  . sodium chloride flush  3 mL Intravenous Q12H  . sodium chloride flush  5 mL Intracatheter Q8H  . Tbo-Filgrastim  480 mcg Subcutaneous 6 days   Background: 62 y.o. 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 Fluid collection w/ CT guided drain placed on 09-16-17, removed 2/2 malpositioning. Cultures + last adm for  GBS, lactobacillus, serratia and yeast on cipro PTA. Adm 10/03/17 from SNF with fevers, AMS. IN ED L nephrostomy fell out w/purulent urine from site. We were asked to see for recurrent AKI on CKD creatinine up to 3.7  (most recent creatinine PTA new baseline of 2; baseline prior to 08/2017 admit was around 02 Jun 2017).   Assessment/Recommendations  1. AKI on CKD - most recent (new) baseline creatinine around 2 prior to current admission. Last AKI was early 08/2017 w/creatinine at that time up to 4.28, had bilat perc tubes placed to divert urine from bladder (colovesical fistula), + vol depletion. Current AKI max creatinine 3.74 some vol depletion, could not exclude obstruction (L perc replaced after coming out, w/purulent urine from exit site, and R perc tube exchanged 10/02/17) and infection. Creatinine improving daily. Back to her new baseline creatinine of 2.  2. Hypokalemia - K runs X 4 yesterday. Repeat today. Intolerant of K tablets. Once replete will see if can tolerate KCl liquid.  3. Hypomagnesemia - Day 3 Mg infusion for Mg 1.1. Mg today up to 1.7. 4. Metabolic acidosis- Corrected. Stopped bicarb drip.  When CO2 starts coming down then transition to po bicarb. NS at 50 until good po 5. Urosepsis - all UC + MRSA. ID recommending dapto X 2 weeks 6. Chronic obstructive uropathy/colovesical fistula - L PCN replaced, R exchanged 3/3; ultimate need for surgical diversion down the road  7. Anemia - with prior Fe def (unable to give IV Fe for tsat of 7 last adm due to infection; similar issues now). Transfuse prn.  8. Sacral decubitus - hydrotherapy has been  advised 9. Chronic neutropenia - on chronic filagrastin at home.  10. Encephalopathy improved with treatment of infection, restoration of volume 11. MRSA PCR +  [name], MD [facility] [phone] Pager 10/05/2017, 9:06 AM

## 2017-10-05 NOTE — Progress Notes (Signed)
[facility] for Infectious Disease   Reason for visit: Follow up on stent infection  Interval History: improved mental status today, husband at bedside; afebrile.  No associated rash, diarrhea Day 3 daptomycin Day 4 total antibiotics  Physical Exam: Constitutional:  Vitals:   10/05/17 0400 10/05/17 0700  BP: 131/62 (!) 138/53  Pulse: 81 70  Resp: 18 14  Temp: 98.2 F (36.8 C) 98.2 F (36.8 C)  SpO2: 92% 93%   patient appears in NAD, up in chair Eyes: anicteric Respiratory: Normal respiratory effort; CTA B Cardiovascular: RRR GI: soft, nt, nd  Review of Systems: Gastrointestinal: negative for diarrhea Integument/breast: negative for rash  Lab Results  Component Value Date   WBC 3.2 (L) 10/04/2017   HGB 7.3 (L) 10/04/2017   HCT 23.6 (L) 10/04/2017   MCV 90.1 10/04/2017   PLT 286 10/04/2017    Lab Results  Component Value Date   CREATININE 2.11 (H) 10/05/2017   BUN 38 (H) 10/05/2017   NA 147 (H) 10/05/2017   K 2.8 (L) 10/05/2017   CL 104 10/05/2017   CO2 29 10/05/2017    Lab Results  Component Value Date   ALT 19 10/04/2017   AST 18 10/04/2017   ALKPHOS 226 (H) 10/04/2017     Microbiology: Recent Results (from the past 240 hour(s))  Blood culture (routine x 2)     Status: None (Preliminary result)   Collection Time: 10/02/17  7:00 AM  Result Value Ref Range Status   Specimen Description BLOOD LEFT ANTECUBITAL  Final   Special Requests IN PEDIATRIC BOTTLE Blood Culture adequate volume  Final   Culture   Final    NO GROWTH 2 DAYS Performed at [facility], [address] [place], [place] [address]    Report Status PENDING  Incomplete  Blood culture (routine x 2)     Status: None (Preliminary result)   Collection Time: 10/02/17  7:25 AM  Result Value Ref Range Status   Specimen Description BLOOD LEFT HAND  Final   Special Requests IN PEDIATRIC BOTTLE Blood Culture adequate volume  Final   Culture   Final    NO GROWTH 2 DAYS Performed  at [facility], [place] [address] [place], [place] [address]    Report Status PENDING  Incomplete  Urine culture     Status: Abnormal   Collection Time: 10/02/17  8:39 AM  Result Value Ref Range Status   Specimen Description URINE, RANDOM  Final   Special Requests   Final    NONE Performed at [facility], [place] [address], [place], [place] [address]    Culture MULTIPLE SPECIES PRESENT, SUGGEST RECOLLECTION (A)  Final   Report Status 10/04/2017 FINAL  Final  Urine culture     Status: Abnormal   Collection Time: 10/02/17  2:20 PM  Result Value Ref Range Status   Specimen Description URINE, CATHETERIZED  Final   Special Requests   Final    NONE Performed at [facility], [place] [address], [place], [place] [address]    Culture (A)  Final    20,000 COLONIES/mL METHICILLIN RESISTANT STAPHYLOCOCCUS AUREUS   Report Status 10/04/2017 FINAL  Final   Organism ID, Bacteria METHICILLIN RESISTANT STAPHYLOCOCCUS AUREUS (A)  Final      Susceptibility   Methicillin resistant staphylococcus aureus - MIC*    CIPROFLOXACIN >=8 RESISTANT Resistant     GENTAMICIN <=0.5 SENSITIVE Sensitive     NITROFURANTOIN <=16 SENSITIVE  Sensitive     OXACILLIN >=4 RESISTANT Resistant     TETRACYCLINE <=1 SENSITIVE Sensitive     VANCOMYCIN <=0.5 SENSITIVE Sensitive     TRIMETH/SULFA >=320 RESISTANT Resistant     CLINDAMYCIN <=0.25 SENSITIVE Sensitive     RIFAMPIN <=0.5 SENSITIVE Sensitive     Inducible Clindamycin NEGATIVE Sensitive     * 20,000 COLONIES/mL METHICILLIN RESISTANT STAPHYLOCOCCUS AUREUS  Urine Culture     Status: Abnormal   Collection Time: 10/02/17  6:31 PM  Result Value Ref Range Status   Specimen Description KIDNEY RIGHT  Final   Special Requests   Final    NONE Performed at [facility], [name] [address] [place], [place] [address]    Culture (A)  Final    50,000 COLONIES/mL METHICILLIN RESISTANT STAPHYLOCOCCUS AUREUS   Report Status 10/04/2017 FINAL  Final   Organism  ID, Bacteria METHICILLIN RESISTANT STAPHYLOCOCCUS AUREUS (A)  Final      Susceptibility   Methicillin resistant staphylococcus aureus - MIC*    CIPROFLOXACIN >=8 RESISTANT Resistant     ERYTHROMYCIN >=8 RESISTANT Resistant     GENTAMICIN <=0.5 SENSITIVE Sensitive     OXACILLIN >=4 RESISTANT Resistant     TETRACYCLINE <=1 SENSITIVE Sensitive     VANCOMYCIN 1 SENSITIVE Sensitive     TRIMETH/SULFA >=320 RESISTANT Resistant     CLINDAMYCIN <=0.25 SENSITIVE Sensitive     RIFAMPIN <=0.5 SENSITIVE Sensitive     Inducible Clindamycin NEGATIVE Sensitive     * 50,000 COLONIES/mL METHICILLIN RESISTANT STAPHYLOCOCCUS AUREUS  MRSA PCR Screening     Status: Abnormal   Collection Time: 10/02/17  6:54 PM  Result Value Ref Range Status   MRSA by PCR POSITIVE (A) NEGATIVE Final    Comment:        The GeneXpert MRSA Assay (FDA approved for NASAL specimens only), is one component of a comprehensive MRSA colonization surveillance program. It is not intended to diagnose MRSA infection nor to guide or monitor treatment for MRSA infections. RESULT CALLED TO, READ BACK BY AND VERIFIED WITH: TDECAREAUX,RN @2050  10/02/17 BY LHOWARD Performed at [facility], [place] [address] [place], [place] [address]   Urine culture     Status: Abnormal   Collection Time: 10/02/17  7:35 PM  Result Value Ref Range Status   Specimen Description KIDNEY RIGHT  Final   Special Requests   Final    Normal Performed at [facility], [place] [address], [place], [place] [address]    Culture (A)  Final    >=100,000 COLONIES/mL METHICILLIN RESISTANT STAPHYLOCOCCUS AUREUS   Report Status 10/04/2017 FINAL  Final   Organism ID, Bacteria METHICILLIN RESISTANT STAPHYLOCOCCUS AUREUS (A)  Final      Susceptibility   Methicillin resistant staphylococcus aureus - MIC*    CIPROFLOXACIN >=8 RESISTANT Resistant     ERYTHROMYCIN >=8 RESISTANT Resistant     GENTAMICIN <=0.5 SENSITIVE Sensitive     OXACILLIN >=4 RESISTANT  Resistant     TETRACYCLINE <=1 SENSITIVE Sensitive     VANCOMYCIN 1 SENSITIVE Sensitive     TRIMETH/SULFA >=320 RESISTANT Resistant     CLINDAMYCIN <=0.25 SENSITIVE Sensitive     RIFAMPIN <=0.5 SENSITIVE Sensitive     Inducible Clindamycin NEGATIVE Sensitive     * >=100,000 COLONIES/mL METHICILLIN RESISTANT STAPHYLOCOCCUS AUREUS    Impression/Plan:  1. Stent infection - no new positive cultures. All growth with MRSA.  She has responded to daptomycin.  She can continue daptomycin in the hospital  and she can go out at discharge with linezolid orally twice a day for 2 weeks more total. Ok from ID standpoint for d/c.    2.  AMS - responding to above treatment.    I will sign off, call with any questions.

## 2017-10-05 NOTE — Progress Notes (Signed)
   10/05/17 1100  Clinical Encounter Type  Visited With Patient and family together  Visit Type Follow-up  Spiritual Encounters  Spiritual Needs Emotional;Prayer  Stress Factors  Patient Stress Factors Health changes   Followed up from visit on Monday.  Patient was sitting up out of the bed in the chair and talking.  Spouse and sister-in-law present.  Family seemed very please with the progress knowing they still have a ways to go.  Patient seemed ok, but talked about some pain she is having and I think worried will she continue to improve or not.  Will follow and support as needed. Chaplain Katherene Ponto

## 2017-10-05 NOTE — Progress Notes (Signed)
  Speech Language Pathology Treatment: Dysphagia  Patient Details Name: [name] MRN: [id number] DOB: 10-Mar-1956 Today's Date: 10/05/2017 Time: 2909-0301 SLP Time Calculation (min) (ACUTE ONLY): 9 min  Assessment / Plan / Recommendation Clinical Impression  Pt much more alert, interactive and appropriate. Oral manipulation and transit regular texture, thin liquids WFL's. No s/s aspiration. She appears to be at baseline status from a cognitive/swallowing standpoint. Upgrade to regular texture, continue thin and no further ST needed.    HPI HPI: Pt with history of bilateral mastectomies for breast cancer, chronic neutropenia, h/o ovarian and endometrial CA, h/o colostomy 2016, w/takedown/loop ileostomy diversion as well as resection of ureteral stricture w/ bladder hitch reimplantation all in 03/2017, acute renal failure, 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 Pt dc'd from [facility] on 2/24 after >1 month hospitalization. Pt readmitted to [facility] on 10/02/2017 with pain, delirium and acute renal failure and found to have displacement of lt nephrostomy tube and exchange of rt nephrostomy tube on 3/3. CXR No acute cardiopulmonary disease.      SLP Plan  All goals met;Discharge SLP treatment due to (comment)       Recommendations  Diet recommendations: Regular;Thin liquid Liquids provided via: Cup;Straw Medication Administration: Whole meds with liquid Supervision: Patient able to self feed Compensations: Slow rate;Small sips/bites Postural Changes and/or Swallow Maneuvers: Seated upright 90 degrees                Oral Care Recommendations: Oral care BID Follow up Recommendations: None SLP Visit Diagnosis: Dysphagia, oropharyngeal phase (R13.12) Plan: All goals met;Discharge SLP treatment due to (comment)       GO                Houston Siren 10/05/2017, 1:30 PM  Orbie Pyo Colvin Caroli.Ed [employer] [phone]

## 2017-10-05 NOTE — Progress Notes (Signed)
Daily Progress Note   Patient Name: [name]       Date: 10/05/2017 DOB: [birth year]  Age: 62 y.o. MRN#: [id number] Attending Physician: Cristal Ford, DO Primary Care Physician: Carollee Herter, Alferd Apa, DO Admit Date: 10/02/2017  Reason for Consultation/Follow-up: pain control, goals of care.   Subjective: Awake alert Sister in law at bedside Complains of pain in back Eager to eat lunch, she states this is her first solid meal in a few days. Patient repositioned in bed with assistance from sister in law Also discussed with unit case manager See below:   Length of Stay: 3  Current Medications: Scheduled Meds:  . artificial tears  1 application Both Eyes QHS  . chlorhexidine  15 mL Mouth Rinse BID  . Chlorhexidine Gluconate Cloth  6 each Topical Q0600  . collagenase   Topical Daily  . enoxaparin (LOVENOX) injection  30 mg Subcutaneous Q24H  . feeding supplement (PRO-STAT SUGAR FREE 64)  30 mL Oral BID BM  . fentaNYL  25 mcg Transdermal Q72H  . insulin aspart  0-9 Units Subcutaneous TID WC  . levothyroxine  150 mcg Oral QAC breakfast  . mouth rinse  15 mL Mouth Rinse q12n4p  . multivitamin with minerals  1 tablet Oral Daily  . mupirocin ointment  1 application Nasal BID  . polyvinyl alcohol  1 drop Both Eyes QHS  . pregabalin  50 mg Oral BID  . protein supplement shake  11 oz Oral BID BM  . psyllium  1 packet Oral BID  . sodium chloride flush  10-40 mL Intracatheter Q12H  . sodium chloride flush  3 mL Intravenous Q12H  . sodium chloride flush  5 mL Intracatheter Q8H  . Tbo-Filgrastim  480 mcg Subcutaneous 6 days    Continuous Infusions: . DAPTOmycin (CUBICIN)  IV Stopped (10/03/17 1622)  . sodium chloride 0.9 % 1,000 mL infusion 50 mL/hr at 10/05/17 0028   PPS 30% PRN  Meds: acetaminophen, diphenhydrAMINE, fentaNYL (SUBLIMAZE) injection, lidocaine (PF), naloxone, protein supplement shake, sodium chloride, sodium chloride flush  Physical Exam         Awake  More alert Complains of pain in her back S1 S2 Regular clear breath sounds Has ostomy No edema Generalized weakness, appears pale, appears chronically ill.   Vital Signs: BP 127/72 (BP Location: Right  Arm)   Pulse 68   Temp (!) 97.3 F (36.3 C) (Oral)   Resp 18   Wt 89.8 kg (198 lb)   SpO2 98%   BMI 36.21 kg/m  SpO2: SpO2: 98 % O2 Device: O2 Device: Room Air O2 Flow Rate: O2 Flow Rate (L/min): 4 L/min  Intake/output summary:   Intake/Output Summary (Last 24 hours) at 10/05/2017 1443 Last data filed at 10/05/2017 6295 Gross per 24 hour  Intake 1360 ml  Output 1900 ml  Net -540 ml   LBM: Last BM Date: 10/05/17 Baseline Weight: Weight: 89.7 kg (197 lb 12 oz) Most recent weight: Weight: 89.8 kg (198 lb)       Palliative Assessment/Data:    Flowsheet Rows     Most Recent Value  Intake Tab  Referral Department  Hospitalist  Unit at Time of Referral  Intermediate Care Unit  Palliative Care Primary Diagnosis  Pain  Date Notified  10/02/17  Palliative Care Type  Return patient Palliative Care  Reason for referral  Pain, Clarify Goals of Care  Date of Admission  10/02/17  Date first seen by Palliative Care  10/03/17  # of days Palliative referral response time  1 Day(s)  # of days IP prior to Palliative referral  0  Clinical Assessment  Palliative Performance Scale Score  30%  Psychosocial & Spiritual Assessment  Palliative Care Outcomes  Patient/Family meeting held?  No      Patient Active Problem List   Diagnosis Date Noted  . Encounter for palliative care   . Goals of care, counseling/discussion   . Poor venous access   . Acute renal failure superimposed on chronic kidney disease ([place]) 10/02/2017  . Decubitus ulcer of sacral region, stage 2 10/02/2017  . Metabolic  acidosis 28/41/3244  . MDD (major depressive disorder), single episode, moderate ([place])   . Poorly controlled diabetes mellitus ([place]) 08/31/2017  . Pressure injury of skin 08/31/2017  . 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 History of external beam radiation therapy to pelvis 08/31/2017  . ARF (acute renal failure) ([place]) 08/30/2017  . Adjustment disorder with mixed anxiety and depressed mood 08/30/2017  . Dehydration 08/29/2017  . Chronic kidney disease (CKD), stage III (moderate) ([place]) 08/24/2017  . Wound of right buttock 08/10/2017  . 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 Protein-calorie malnutrition, moderate ([place]) 04/18/2017  . Postoperative anemia 04/04/2017  . 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 Hot flashes related to aromatase inhibitor therapy 03/18/2017  . 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 Vitamin D insufficiency 08/30/2016  . Breast cancer of upper-inner quadrant of left female breast ([place])   . Pelvic pain in female 03/19/2015  . Chronic anemia 12/30/2014  . UTI (urinary tract infection)   . Pancytopenia, acquired ([place]) 11/26/2014  . Morbid obesity ([place]) 11/20/2014  . 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 Abdominal pain 09/09/2014  . Postmenopausal bleeding 09/05/2014  . History of ovarian & endometrial cancer 07/17/2012  . 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[place]) 05/10/2012  . Chronic neutropenia ([place]) 12/14/2011  . Primary hypothyroidism 12/14/2011  . DM type 2 (diabetes mellitus, type 2) ([place]) 12/14/2011  . 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   Recommendations/Plan:   full code/full scope treatment.  Continue TDF 25 mcg for now.   Continue PRN IV Fentanyl, especially before hydrotherapy treatments.   PMT to follow periodically.    Code Status:    Code Status Orders  (From admission, onward)        Start     Ordered   10/02/17 1351  Full code  Continuous     10/02/17 1354    Code Status History    Date Active Date Inactive Code Status Order ID Comments User Context   08/29/2017 19:04 09/25/2017 19:38 Full Code [id number]  Michael [name], MD Inpatient   08/26/2017 13:18 08/29/2017 18:31 Full Code [id number]  Michael [name], MD Outpatient   08/25/2017 12:28 08/26/2017 13:18 Full Code [id number]  Michael [name], MD Outpatient   06/20/2017 08:36 08/17/2017 09:58 Full Code [id number]  Michael [name], MD Outpatient   04/16/2017 12:44 04/25/2017 17:05 Full Code [id number]  Excell Seltzer, MD Inpatient   04/02/2017 02:00 04/07/2017 20:54 Full Code [id number]  Michael [name], MD Inpatient   12/30/2014 23:30 01/02/2015 15:38 Full Code [id number]  Rise Patience, MD Inpatient   12/02/2014 16:49 12/12/2014 20:24 Full Code [id number]  Everitt Amber, MD Inpatient   11/19/2014 19:02 11/25/2014 18:43 Full Code [id number]  Michael [name], MD Inpatient       Prognosis:   guarded   Discharge Planning:  To Be Determined Likely will need SNF Care plan was discussed with  Patient and her sister in law.   Thank you for allowing the Palliative Medicine Team to assist in the care of this patient.   Time In: 1400 Time Out: 14.25 Total Time 25 Prolonged Time Billed  no       Greater than 50%  of this time was spent counseling and coordinating care related to the above assessment and plan.  Loistine Chance, MD [phone]  Please contact Palliative Medicine Team phone at [phone] for questions and concerns.

## 2017-10-05 NOTE — Consult Note (Signed)
[facility] Nurse wound follow up Wound type:sacral unstageable POA Measurement:not seen today Wound bed: Drainage (amount, consistency, odor)  Periwound: Dressing procedure/placement/frequency:Patient receiving hydrotherapy by PT. Patient is sitting in chair eating breakfast this am. Did not assess wound, PT will be performing hydrotherapy later today.  Husband is present, both patient and husband seem relieved that patient is starting to return to her baseline.Pouch that was placed yesterday is intact without leakage. Will continue to visit patient for support.  Fara Olden, RN-C, WTA-C, [place] Wound Treatment Associate Ostomy Care Associate

## 2017-10-06 LAB — RENAL FUNCTION PANEL
Albumin: 1.7 g/dL — ABNORMAL LOW (ref 3.5–5.0)
Anion gap: 9 (ref 5–15)
BUN: 35 mg/dL — ABNORMAL HIGH (ref 6–20)
CO2: 28 mmol/L (ref 22–32)
Calcium: 7.9 mg/dL — ABNORMAL LOW (ref 8.9–10.3)
Chloride: 100 mmol/L — ABNORMAL LOW (ref 101–111)
Creatinine, Ser: 1.71 mg/dL — ABNORMAL HIGH (ref 0.44–1.00)
GFR calc Af Amer: 36 mL/min — ABNORMAL LOW (ref >60)
GFR calc non Af Amer: 31 mL/min — ABNORMAL LOW (ref >60)
Glucose, Bld: 111 mg/dL — ABNORMAL HIGH (ref 65–99)
Phosphorus: 2.8 mg/dL (ref 2.5–4.6)
Potassium: 3.2 mmol/L — ABNORMAL LOW (ref 3.5–5.1)
Sodium: 137 mmol/L (ref 135–145)

## 2017-10-06 LAB — MAGNESIUM: Magnesium: 1.6 mg/dL — ABNORMAL LOW (ref 1.7–2.4)

## 2017-10-06 LAB — CBC
HCT: 28.3 % — ABNORMAL LOW (ref 36.0–46.0)
Hemoglobin: 8.8 g/dL — ABNORMAL LOW (ref 12.0–15.0)
MCH: 28.7 pg (ref 26.0–34.0)
MCHC: 31.1 g/dL (ref 30.0–36.0)
MCV: 92.2 fL (ref 78.0–100.0)
Platelets: 274 K/uL (ref 150–400)
RBC: 3.07 MIL/uL — ABNORMAL LOW (ref 3.87–5.11)
RDW: 16.2 % — ABNORMAL HIGH (ref 11.5–15.5)
WBC: 21.7 K/uL — ABNORMAL HIGH (ref 4.0–10.5)

## 2017-10-06 LAB — GLUCOSE, CAPILLARY
Glucose-Capillary: 108 mg/dL — ABNORMAL HIGH (ref 65–99)
Glucose-Capillary: 167 mg/dL — ABNORMAL HIGH (ref 65–99)
Glucose-Capillary: 121 mg/dL — ABNORMAL HIGH (ref 65–99)
Glucose-Capillary: 134 mg/dL — ABNORMAL HIGH (ref 65–99)

## 2017-10-06 MED ORDER — HYDROMORPHONE HCL 2 MG PO TABS
2.0000 mg | ORAL_TABLET | ORAL | Status: DC | PRN
  Administered 2017-10-06 – 2017-10-13 (×12): 2 mg via ORAL
  Filled 2017-10-06 (×12): qty 1

## 2017-10-06 MED ORDER — ORAL CARE MOUTH RINSE
15.0000 mL | Freq: Two times a day (BID) | OROMUCOSAL | Status: DC
  Administered 2017-10-08 – 2017-10-11 (×8): 15 mL via OROMUCOSAL

## 2017-10-06 MED ORDER — POTASSIUM CHLORIDE 10 MEQ/50ML IV SOLN
10.0000 meq | INTRAVENOUS | Status: AC
  Administered 2017-10-06 (×3): 10 meq via INTRAVENOUS

## 2017-10-06 MED ORDER — NYSTATIN 100000 UNIT/ML MT SUSP
5.0000 mL | Freq: Four times a day (QID) | OROMUCOSAL | Status: DC
  Administered 2017-10-06 – 2017-10-13 (×23): 500000 [IU] via ORAL
  Filled 2017-10-06 (×20): qty 5

## 2017-10-06 MED ORDER — POTASSIUM CHLORIDE 20 MEQ/15ML (10%) PO SOLN
20.0000 meq | Freq: Two times a day (BID) | ORAL | Status: DC
  Administered 2017-10-06 – 2017-10-08 (×5): 20 meq via ORAL
  Filled 2017-10-06 (×5): qty 15

## 2017-10-06 MED ORDER — MAGNESIUM SULFATE 2 GM/50ML IV SOLN
2.0000 g | Freq: Once | INTRAVENOUS | Status: AC
  Administered 2017-10-06: 2 g via INTRAVENOUS
  Filled 2017-10-06: qty 50

## 2017-10-06 MED ORDER — POTASSIUM CHLORIDE CRYS ER 20 MEQ PO TBCR: 30.0000 meq | EXTENDED_RELEASE_TABLET | Freq: Once | ORAL | Status: DC

## 2017-10-06 MED ORDER — PREMIER PROTEIN SHAKE
11.0000 [oz_av] | Freq: Three times a day (TID) | ORAL | Status: DC
  Administered 2017-10-07 – 2017-10-13 (×15): 11 [oz_av] via ORAL
  Filled 2017-10-06 (×35): qty 325.31

## 2017-10-06 MED ORDER — POTASSIUM CHLORIDE 10 MEQ/50ML IV SOLN
INTRAVENOUS | Status: AC
  Filled 2017-10-06: qty 150

## 2017-10-06 MED ORDER — MAGNESIUM OXIDE 400 (241.3 MG) MG PO TABS
800.0000 mg | ORAL_TABLET | Freq: Two times a day (BID) | ORAL | Status: DC
  Administered 2017-10-06 – 2017-10-13 (×13): 800 mg via ORAL
  Filled 2017-10-06 (×15): qty 2

## 2017-10-06 MED ORDER — HYDROMORPHONE HCL 2 MG PO TABS
6.0000 mg | ORAL_TABLET | ORAL | Status: DC | PRN
  Filled 2017-10-06: qty 3

## 2017-10-06 MED ORDER — SODIUM CHLORIDE 0.9 % IV SOLN
500.0000 mg | INTRAVENOUS | Status: DC
  Administered 2017-10-06 – 2017-10-13 (×8): 500 mg via INTRAVENOUS
  Filled 2017-10-06 (×9): qty 10

## 2017-10-06 NOTE — NC FL2 (Signed)
[place] LEVEL OF CARE SCREENING TOOL     IDENTIFICATION  Patient Name: [name] Birthdate: [birth year] Sex: female Admission Date (Current Location): 10/02/2017  [facility] and [place] Number:  Herbalist and Address:  [facility], [place] [address], [place], [place] [address]      Provider Number: [id number]  Attending Physician Name and Address:  Cristal Ford, DO  Relative Name and Phone Number:  Chloee Tena,  [phone]    Current Level of Care: Hospital Recommended Level of Care: [place] Prior Approval Number:    Date Approved/Denied:   PASRR Number: [id number]  Discharge Plan: SNF    Current Diagnoses: Patient Active Problem List   Diagnosis Date Noted  . Encounter for palliative care   . Goals of care, counseling/discussion   . Poor venous access   . Acute renal failure superimposed on chronic kidney disease ([place]) 10/02/2017  . Decubitus ulcer of sacral region, stage 2 10/02/2017  . Metabolic acidosis 00/86/7619  . MDD (major depressive disorder), single episode, moderate ([place])   . Poorly controlled diabetes mellitus ([place]) 08/31/2017  . Pressure injury of skin 08/31/2017  . 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 History of external beam radiation therapy to pelvis 08/31/2017  . ARF (acute renal failure) ([place]) 08/30/2017  . Adjustment disorder with mixed anxiety and depressed mood 08/30/2017  . Dehydration 08/29/2017  . Chronic kidney disease (CKD), stage III (moderate) ([place]) 08/24/2017  . Wound of right buttock 08/10/2017  . 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 Protein-calorie malnutrition, moderate ([place]) 04/18/2017  . Postoperative anemia 04/04/2017  . 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 Hot flashes related to aromatase inhibitor therapy 03/18/2017  . Ureteral stricture, right, s/p  resection/reimplantation into bladder (Heineke-Mikulicz with Psoas Hitch) 04/01/2017 09/10/2016  . Vitamin D insufficiency 08/30/2016  . Breast cancer of upper-inner quadrant of left female breast ([place])   . Pelvic pain in female 03/19/2015  . Chronic anemia 12/30/2014  . UTI (urinary tract infection)   . Pancytopenia, acquired ([place]) 11/26/2014  . Morbid obesity ([place]) 11/20/2014  . 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 Abdominal pain 09/09/2014  . Postmenopausal bleeding 09/05/2014  . History of ovarian & endometrial cancer 07/17/2012  . 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[place]) 05/10/2012  . Chronic neutropenia ([place]) 12/14/2011  . Primary hypothyroidism 12/14/2011  . DM type 2 (diabetes mellitus, type 2) ([place]) 12/14/2011  . Benign essential HTN 12/14/2011    Orientation RESPIRATION BLADDER Height & Weight     Time, Situation, Place, Self  Normal Continent, Indwelling catheter Weight: 195 lb (88.5 kg) Height:     BEHAVIORAL SYMPTOMS/MOOD NEUROLOGICAL BOWEL NUTRITION STATUS      Ileostomy, Continent Diet(carb modified)  AMBULATORY STATUS COMMUNICATION OF NEEDS Skin   Limited Assist(unable) Verbally                         Personal Care Assistance Level of Assistance    Bathing Assistance: Maximum assistance Feeding assistance: Independent Dressing Assistance: Maximum assistance     Functional Limitations Info    Sight Info: Adequate Hearing Info: Adequate Speech Info: Adequate    SPECIAL CARE FACTORS FREQUENCY  PT (By licensed PT), OT (By licensed OT)     PT Frequency: 5x wk OT Frequency: 5x wk  Contractures Contractures Info: Not present    Additional Factors Info  Isolation Precautions Code Status Info: Full Code Allergies Info: PENICILLINS, ADHESIVE TAPE, CEFACLOR, ERYTHROMYCIN, TRIMETHOPRIM, ULTRAM TRAMADOL, FLUCONAZOLE, OXYCODONE, PECTIN, SULFA  ANTIBIOTICS     Isolation Precautions Info: MRSA     Current Medications (10/06/2017):  This is the current hospital active medication list Current Facility-Administered Medications  Medication Dose Route Frequency Provider Last Rate Last Dose  . acetaminophen (TYLENOL) tablet 650 mg  650 mg Oral Q6H PRN Lady Deutscher, MD   650 mg at 10/06/17 1140  . artificial tears (LACRILUBE) ophthalmic ointment 1 application  1 application Both Eyes QHS Lady Deutscher, MD   1 application at 16/10/96 2134  . chlorhexidine (PERIDEX) 0.12 % solution 15 mL  15 mL Mouth Rinse BID Georgette Shell, MD   15 mL at 10/06/17 1019  . Chlorhexidine Gluconate Cloth 2 % PADS 6 each  6 each Topical Q0600 Lady Deutscher, MD   6 each at 10/06/17 [phone]  . collagenase (SANTYL) ointment   Topical Daily Georgette Shell, MD      . DAPTOmycin (CUBICIN) 500 mg in sodium chloride 0.9 % IVPB  500 mg Intravenous Q24H Mikhail, [place], DO      . diphenhydrAMINE (BENADRYL) capsule 25 mg  25 mg Oral Q8H PRN Lady Deutscher, MD      . enoxaparin (LOVENOX) injection 30 mg  30 mg Subcutaneous Q24H Lady Deutscher, MD   30 mg at 10/05/17 2135  . feeding supplement (PRO-STAT SUGAR FREE 64) liquid 30 mL  30 mL Oral BID BM Georgette Shell, MD      . fentaNYL ([place] - dosed mcg/hr) patch 25 mcg  25 mcg Transdermal Q72H Micheline Rough, MD   25 mcg at 10/03/17 1316  . fentaNYL (SUBLIMAZE) injection 25-50 mcg  25-50 mcg Intravenous Q1H PRN Micheline Rough, MD   25 mcg at 10/05/17 1144  . insulin aspart (novoLOG) injection 0-9 Units  0-9 Units Subcutaneous TID WC Georgette Shell, MD   1 Units at 10/05/17 1321  . levothyroxine (SYNTHROID, LEVOTHROID) tablet 150 mcg  150 mcg Oral QAC breakfast Lady Deutscher, MD   150 mcg at 10/06/17 0838  . lidocaine (PF) (XYLOCAINE) 1 % injection    PRN Sandi Mariscal, MD   10 mL at 10/02/17 1818  . magnesium oxide (MAG-OX) tablet 800 mg  800 mg Oral BID [name], MD    800 mg at 10/06/17 0920  . MEDLINE mouth rinse  15 mL Mouth Rinse q12n4p Georgette Shell, MD   15 mL at 10/04/17 1700  . multivitamin with minerals tablet 1 tablet  1 tablet Oral Daily Georgette Shell, MD   1 tablet at 10/06/17 0920  . mupirocin ointment (BACTROBAN) 2 % 1 application  1 application Nasal BID Lady Deutscher, MD   1 application at 09/81/19 1139  . naloxone [facility]) injection 0.4 mg  0.4 mg Intravenous PRN Lady Deutscher, MD   0.2 mg at 10/02/17 2045  . polyvinyl alcohol (LIQUIFILM TEARS) 1.4 % ophthalmic solution 1 drop  1 drop Both Eyes QHS Lady Deutscher, MD   1 drop at 10/05/17 2134  . potassium chloride 10 MEQ/50ML IVPB           . potassium chloride 20 MEQ/15ML (10%) solution 20 mEq  20 mEq Oral BID [name], MD   20 mEq at 10/06/17 0900  . pregabalin (LYRICA) capsule 50 mg  50 mg Oral BID Lady Deutscher, MD   50 mg at 10/06/17 0919  . protein supplement (PREMIER PROTEIN) liquid  11 oz Oral BID PRN Lady Deutscher, MD      . protein supplement (PREMIER PROTEIN) liquid  11 oz Oral BID BM Georgette Shell, MD   11 oz at 10/05/17 0915  . psyllium (HYDROCIL/METAMUCIL) packet 1 packet  1 packet Oral BID Lady Deutscher, MD   1 packet at 10/04/17 2142  . sodium chloride (OCEAN) 0.65 % nasal spray 1 spray  1 spray Each Nare PRN Lady Deutscher, MD      . sodium chloride 0.9 % 1,000 mL infusion   Intravenous Continuous [name], MD 50 mL/hr at 10/06/17 0500    . sodium chloride flush (NS) 0.9 % injection 10-40 mL  10-40 mL Intracatheter Q12H Georgette Shell, MD   10 mL at 10/06/17 5436  . sodium chloride flush (NS) 0.9 % injection 10-40 mL  10-40 mL Intracatheter PRN Georgette Shell, MD      . sodium chloride flush (NS) 0.9 % injection 3 mL  3 mL Intravenous Q12H Lady Deutscher, MD   3 mL at 10/05/17 2139  . sodium chloride flush (NS) 0.9 % injection 5 mL  5 mL Intracatheter Q8H Sandi Mariscal, MD   5 mL at 10/05/17 2139  .  Tbo-Filgrastim (GRANIX) injection 480 mcg  480 mcg Subcutaneous 6 days Georgette Shell, MD   480 mcg at 10/04/17 1338     Discharge Medications: Please see discharge summary for a list of discharge medications.  Relevant Imaging Results:  Relevant Lab Results:   Additional Information SS# [id number] pt will go out with 2 weeks of oral [place], LCSW

## 2017-10-06 NOTE — Progress Notes (Signed)
PROGRESS NOTE    [name]  TML:[id number] DOB: May 12, 1956 DOA: 10/02/2017 PCP: Ann Held, DO   Brief Narrative:  62 year old female with history of endometrial cancer, ovarian cancer, diabetes, hypothyroidism, hypertension, chronic kidney disease who presented with worsening delirium and renal function.  Found to have ureteral stent infection with MRSA, placed on daptomycin.  Infectious disease consulted and appreciated, recommending discharge with Jeani Hawking nasal lid twice daily for 2 weeks.  Nephrology also consulted and following.  Neurology consulted and feels patient would need urinary diversion procedure however will be sometime in the future.  Currently pending SNF placement.  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 -Patient had replacement of left PCN-was noted to have purulent urine from exit site. -Right percutaneous tube was exchanged on 10/02/2017 -Creatinine currently 1.71 -Nephrology consulted and appreciated  Sepsis secondary to MRSA UTI/Stent infection -patient was tachycardic with tachypnea on the day of admission -Patient also found to have neutropenia however that is chronic, currently WBCs are up to 36 -UA showed many bacteria, TNTC WBC, large leukocytes -Blood cultures no growth to date -Urine culture shows MRSA -as noted above, with replacement of left PCN, purulent urine was noted -Infectious disease consulted and appreciated, currently on daptomycin.  Commended use of linezolid for extended 2 weeks at discharge  Hypomagnesemia/hypokalemia -Potassium currently being replaced, will order IV magnesium for replacement -Continue to  monitor  Metabolic acidosis -Please secondary to AKI, patient did require bicarbonate drip which was discontinued  Anemia secondary to chronic disease/iron deficiency -Hemoglobin dropped to 7.3, currently 8.8 (after transfusion) -FOBT ordered -Continue to monitor CBC  Sacral decubitus ulcer -PT currently advising hydrotherapy -discussed with PT, will likely continue hydrotherapy through 10/07/2017- depending on how wound appears  Encephalopathy, metabolic -Appears to have improved, suspect likely secondary to infection and acute kidney injury  Metastatic endometrial cancer/chronic neutropenia -Patient takes filgastrin at home  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 Spoke with husband via phone.  Disposition Plan: Admitted, continue to monitor in stepdown. Pending SNF when medically stable.   Consultants Infectious disease Nephrology Interventional radiology Urology Palliative care  Procedures  Left PCN replacement and exchange of right PCN interventional radiology  Antibiotics   Anti-infectives (From admission, onward)   Start     Dose/Rate Route Frequency Ordered Stop   10/06/17 1600  DAPTOmycin (CUBICIN) 500 mg in sodium chloride 0.9 % IVPB     500 mg 220 mL/hr over 30 Minutes Intravenous Every 24 hours 10/06/17 1041     10/04/17 1200  vancomycin (VANCOCIN) IVPB 1000 mg/200 mL premix  Status:  Discontinued     1,000 mg 200 mL/hr  over 60 Minutes Intravenous Every 48 hours 10/02/17 0903 10/02/17 1539   10/04/17 0800  levofloxacin (LEVAQUIN) IVPB 500 mg  Status:  Discontinued     500 mg 100 mL/hr over 60 Minutes Intravenous Every 48 hours 10/02/17 0903 10/02/17 1539   10/03/17 1600  DAPTOmycin (CUBICIN) 500 mg in sodium chloride 0.9 % IVPB  Status:  Discontinued     500 mg 220 mL/hr over 30 Minutes Intravenous Every 48 hours 10/02/17 1603 10/06/17 1041   10/02/17 2000  aztreonam (AZACTAM) 500 mg in dextrose 5 % 50 mL IVPB  Status:  Discontinued     500 mg 100 mL/hr over 30 Minutes Intravenous Every 8 hours 10/02/17 0903 10/02/17 1539   10/02/17 2000  meropenem (MERREM) 500 mg in sodium chloride 0.9 % 100 mL IVPB  Status:  Discontinued     500 mg 200 mL/hr over 30 Minutes Intravenous Every 12 hours 10/02/17 1603 10/04/17 0906   10/02/17 0915  aztreonam (AZACTAM) 2 g in sodium chloride 0.9 % 100 mL IVPB     2 g 200 mL/hr over 30 Minutes Intravenous  Once 10/02/17 0902 10/02/17 1007   10/02/17 0915  vancomycin (VANCOCIN) 1,500 mg in sodium chloride 0.9 % 500 mL IVPB     1,500 mg 250 mL/hr over 120 Minutes Intravenous  Once 10/02/17 0902 10/02/17 1243   10/02/17 0730  levofloxacin (LEVAQUIN) IVPB 750 mg     750 mg 100 mL/hr over 90 Minutes Intravenous  Once 10/02/17 0723 10/02/17 0915   10/02/17 0730  aztreonam (AZACTAM) 2 g in sodium chloride 0.9 % 100 mL IVPB  Status:  Discontinued     2 g 200 mL/hr over 30 Minutes Intravenous  Once 10/02/17 0723 10/02/17 0902   10/02/17 0730  vancomycin (VANCOCIN) IVPB 1000 mg/200 mL premix  Status:  Discontinued     1,000 mg 200 mL/hr over 60 Minutes Intravenous  Once 10/02/17 0723 10/02/17 0902      Subjective:   [name] [place] seen and examined today.  Patient has no complaints today.  States she is feeling better. Denies current chest pain, shortness of breath, abdominal pain, nausea, vomiting, dizziness, headache.   Objective:   Vitals:   10/06/17 0400 10/06/17 0809  10/06/17 1153 10/06/17 1224  BP: 104/65 138/62 130/81   Pulse: (!) 44 (!) 52 63   Resp: 12 19 17    Temp:  98.2 F (36.8 C) (!) 97.5 F (36.4 C)   TempSrc:  Oral Oral   SpO2:  97% 100%   Weight:      Height:    5\' 2"  (1.575 m)    Intake/Output Summary (Last 24 hours) at 10/06/2017 1316 Last data filed at 10/06/2017 1144 Gross per 24 hour  Intake 2317 ml  Output 2825 ml  Net -508 ml   Filed Weights   10/04/17 0425 10/05/17 0630 10/06/17 0349  Weight: 91.2 kg (201 lb) 89.8 kg (198 lb) 88.5 kg (195 lb)   Exam  General: Well developed, well nourished, NAD, appears stated age  HEENT: NCAT, mucous membranes moist.   Neck: Supple  Cardiovascular: S1 S2 auscultated, SEM, RRR  Respiratory: Clear to auscultation bilaterally with equal chest rise  Abdomen: Soft, nontender, nondistended, + bowel sounds  Extremities: warm dry without cyanosis clubbing or edema  Neuro: AAOx3, nonfocal  Skin: Without rashes exudates or nodules  Psych: Normal affect and demeanor, pleasant  Data Reviewed: I have personally reviewed following labs and imaging studies  CBC: Recent Labs  Lab 10/02/17 0729 10/03/17 0253 10/04/17 0220 10/05/17 0500 10/06/17 0240  WBC 2.9* 4.5 3.2* 36.0* 21.7*  NEUTROABS 1.5*  --   --   --   --  HGB 9.9* 8.3* 7.3* 9.1* 8.8*  HCT 32.2* 27.2* 23.6* 29.7* 28.3*  MCV 94.7 92.5 90.1 93.1 92.2  PLT 387 332 286 359 563   Basic Metabolic Panel: Recent Labs  Lab 10/02/17 0729 10/02/17 2221 10/03/17 0253 10/04/17 0220 10/04/17 1041 10/05/17 0500 10/05/17 0533 10/06/17 0240  NA 136  --  141 145  --   --  147* 137  K 4.0  --  3.6 2.4*  --   --  2.8* 3.2*  CL 109  --  112* 106  --   --  104 100*  CO2 14*  --  16* 27  --   --  29 28  GLUCOSE 123*  --  155* 83  --   --  126* 111*  BUN 52*  --  52* 49*  --   --  38* 35*  CREATININE 3.74*  --  3.27* 2.78*  --   --  2.11* 1.71*  CALCIUM 9.2  --  8.5* 8.1*  --   --  8.1* 7.9*  MG  --  1.1*  --   --  1.7 1.5*  --   1.6*  PHOS  --   --  5.3* 3.6  --   --  2.1* 2.8   GFR: Estimated Creatinine Clearance: 34.8 mL/min (A) (by C-G formula based on SCr of 1.71 mg/dL (H)). Liver Function Tests: Recent Labs  Lab 10/02/17 0729 10/03/17 0253 10/04/17 0220 10/05/17 0533 10/06/17 0240  AST 15  --  18  --   --   ALT 20  --  19  --   --   ALKPHOS 248*  --  226*  --   --   BILITOT 0.4  --  0.7  --   --   PROT 7.7  --  5.6*  --   --   ALBUMIN 1.9* 1.5* 1.5* 1.6* 1.7*   No results for input(s): LIPASE, AMYLASE in the last 168 hours. No results for input(s): AMMONIA in the last 168 hours. Coagulation Profile: Recent Labs  Lab 10/02/17 2221  INR 1.34   Cardiac Enzymes: Recent Labs  Lab 10/02/17 2221 10/03/17 0253  CKTOTAL 16* 18*   BNP (last 3 results) No results for input(s): PROBNP in the last 8760 hours. HbA1C: No results for input(s): HGBA1C in the last 72 hours. CBG: Recent Labs  Lab 10/05/17 1113 10/05/17 1650 10/05/17 2124 10/06/17 0810 10/06/17 1150  GLUCAP 147* 114* 153* 108* 167*   Lipid Profile: No results for input(s): CHOL, HDL, LDLCALC, TRIG, CHOLHDL, LDLDIRECT in the last 72 hours. Thyroid Function Tests: No results for input(s): TSH, T4TOTAL, FREET4, T3FREE, THYROIDAB in the last 72 hours. Anemia Panel: No results for input(s): VITAMINB12, FOLATE, FERRITIN, TIBC, IRON, RETICCTPCT in the last 72 hours. Urine analysis:    Component Value Date/Time   COLORURINE YELLOW 10/02/2017 0836   APPEARANCEUR CLOUDY (A) 10/02/2017 0836   LABSPEC 1.025 10/02/2017 0836   LABSPEC 1.015 11/17/2015 1436   PHURINE 5.5 10/02/2017 0836   GLUCOSEU NEGATIVE 10/02/2017 0836   GLUCOSEU 2,000 11/17/2015 1436   HGBUR MODERATE (A) 10/02/2017 0836   BILIRUBINUR NEGATIVE 10/02/2017 0836   BILIRUBINUR neg 08/30/2016 1025   BILIRUBINUR Negative 11/17/2015 1436   KETONESUR 15 (A) 10/02/2017 0836   PROTEINUR >300 (A) 10/02/2017 0836   UROBILINOGEN negative 08/30/2016 1025   UROBILINOGEN 0.2  11/17/2015 1436   NITRITE NEGATIVE 10/02/2017 0836   LEUKOCYTESUR LARGE (A) 10/02/2017 0836   LEUKOCYTESUR Moderate 11/17/2015 1436  Sepsis Labs: @LABRCNTIP (procalcitonin:4,lacticidven:4)  ) Recent Results (from the past 240 hour(s))  Blood culture (routine x 2)     Status: None (Preliminary result)   Collection Time: 10/02/17  7:00 AM  Result Value Ref Range Status   Specimen Description BLOOD LEFT ANTECUBITAL  Final   Special Requests IN PEDIATRIC BOTTLE Blood Culture adequate volume  Final   Culture   Final    NO GROWTH 4 DAYS Performed at [facility], [place] [address], [place], [place] [address]    Report Status PENDING  Incomplete  Blood culture (routine x 2)     Status: None (Preliminary result)   Collection Time: 10/02/17  7:25 AM  Result Value Ref Range Status   Specimen Description BLOOD LEFT HAND  Final   Special Requests IN PEDIATRIC BOTTLE Blood Culture adequate volume  Final   Culture   Final    NO GROWTH 4 DAYS Performed at [facility], [place] [address], [place], [place] [address]    Report Status PENDING  Incomplete  Urine culture     Status: Abnormal   Collection Time: 10/02/17  8:39 AM  Result Value Ref Range Status   Specimen Description URINE, RANDOM  Final   Special Requests   Final    NONE Performed at [facility], [place] [address], [place], [place] [address]    Culture MULTIPLE SPECIES PRESENT, SUGGEST RECOLLECTION (A)  Final   Report Status 10/04/2017 FINAL  Final  Urine culture     Status: Abnormal   Collection Time: 10/02/17  2:20 PM  Result Value Ref Range Status   Specimen Description URINE, CATHETERIZED  Final   Special Requests   Final    NONE Performed at [facility], [place] [address] [place], [place] [address]    Culture (A)  Final    20,000 COLONIES/mL METHICILLIN RESISTANT STAPHYLOCOCCUS AUREUS   Report Status 10/04/2017 FINAL  Final   Organism ID, Bacteria METHICILLIN RESISTANT STAPHYLOCOCCUS AUREUS (A)  Final        Susceptibility   Methicillin resistant staphylococcus aureus - MIC*    CIPROFLOXACIN >=8 RESISTANT Resistant     GENTAMICIN <=0.5 SENSITIVE Sensitive     NITROFURANTOIN <=16 SENSITIVE Sensitive     OXACILLIN >=4 RESISTANT Resistant     TETRACYCLINE <=1 SENSITIVE Sensitive     VANCOMYCIN <=0.5 SENSITIVE Sensitive     TRIMETH/SULFA >=320 RESISTANT Resistant     CLINDAMYCIN <=0.25 SENSITIVE Sensitive     RIFAMPIN <=0.5 SENSITIVE Sensitive     Inducible Clindamycin NEGATIVE Sensitive     * 20,000 COLONIES/mL METHICILLIN RESISTANT STAPHYLOCOCCUS AUREUS  Urine Culture     Status: Abnormal   Collection Time: 10/02/17  6:31 PM  Result Value Ref Range Status   Specimen Description KIDNEY RIGHT  Final   Special Requests   Final    NONE Performed at [facility], [place] [address] [place], [place] [address]    Culture (A)  Final    50,000 COLONIES/mL METHICILLIN RESISTANT STAPHYLOCOCCUS AUREUS   Report Status 10/04/2017 FINAL  Final   Organism ID, Bacteria METHICILLIN RESISTANT STAPHYLOCOCCUS AUREUS (A)  Final      Susceptibility   Methicillin resistant staphylococcus aureus - MIC*    CIPROFLOXACIN >=8 RESISTANT Resistant     ERYTHROMYCIN >=8 RESISTANT Resistant     GENTAMICIN <=0.5 SENSITIVE Sensitive     OXACILLIN >=4 RESISTANT Resistant     TETRACYCLINE <=1 SENSITIVE Sensitive  VANCOMYCIN 1 SENSITIVE Sensitive     TRIMETH/SULFA >=320 RESISTANT Resistant     CLINDAMYCIN <=0.25 SENSITIVE Sensitive     RIFAMPIN <=0.5 SENSITIVE Sensitive     Inducible Clindamycin NEGATIVE Sensitive     * 50,000 COLONIES/mL METHICILLIN RESISTANT STAPHYLOCOCCUS AUREUS  MRSA PCR Screening     Status: Abnormal   Collection Time: 10/02/17  6:54 PM  Result Value Ref Range Status   MRSA by PCR POSITIVE (A) NEGATIVE Final    Comment:        The GeneXpert MRSA Assay (FDA approved for NASAL specimens only), is one component of a comprehensive MRSA colonization surveillance program. It is  not intended to diagnose MRSA infection nor to guide or monitor treatment for MRSA infections. RESULT CALLED TO, READ BACK BY AND VERIFIED WITH: TDECAREAUX,RN @2050  10/02/17 BY LHOWARD Performed at [facility], [place] [address] [place], [place] [address]   Urine culture     Status: Abnormal   Collection Time: 10/02/17  7:35 PM  Result Value Ref Range Status   Specimen Description KIDNEY RIGHT  Final   Special Requests   Final    Normal Performed at [facility], [place] [address], [place], [place] [address]    Culture (A)  Final    >=100,000 COLONIES/mL METHICILLIN RESISTANT STAPHYLOCOCCUS AUREUS   Report Status 10/04/2017 FINAL  Final   Organism ID, Bacteria METHICILLIN RESISTANT STAPHYLOCOCCUS AUREUS (A)  Final      Susceptibility   Methicillin resistant staphylococcus aureus - MIC*    CIPROFLOXACIN >=8 RESISTANT Resistant     ERYTHROMYCIN >=8 RESISTANT Resistant     GENTAMICIN <=0.5 SENSITIVE Sensitive     OXACILLIN >=4 RESISTANT Resistant     TETRACYCLINE <=1 SENSITIVE Sensitive     VANCOMYCIN 1 SENSITIVE Sensitive     TRIMETH/SULFA >=320 RESISTANT Resistant     CLINDAMYCIN <=0.25 SENSITIVE Sensitive     RIFAMPIN <=0.5 SENSITIVE Sensitive     Inducible Clindamycin NEGATIVE Sensitive     * >=100,000 COLONIES/mL METHICILLIN RESISTANT STAPHYLOCOCCUS AUREUS      Radiology Studies: No results found.   Scheduled Meds: . artificial tears  1 application Both Eyes QHS  . chlorhexidine  15 mL Mouth Rinse BID  . Chlorhexidine Gluconate Cloth  6 each Topical Q0600  . collagenase   Topical Daily  . enoxaparin (LOVENOX) injection  30 mg Subcutaneous Q24H  . feeding supplement (PRO-STAT SUGAR FREE 64)  30 mL Oral BID BM  . fentaNYL  25 mcg Transdermal Q72H  . insulin aspart  0-9 Units Subcutaneous TID WC  . levothyroxine  150 mcg Oral QAC breakfast  . magnesium oxide  800 mg Oral BID  . mouth rinse  15 mL Mouth Rinse q12n4p  . multivitamin with minerals  1 tablet  Oral Daily  . mupirocin ointment  1 application Nasal BID  . polyvinyl alcohol  1 drop Both Eyes QHS  . potassium chloride  20 mEq Oral BID  . pregabalin  50 mg Oral BID  . protein supplement shake  11 oz Oral BID BM  . psyllium  1 packet Oral BID  . sodium chloride flush  10-40 mL Intracatheter Q12H  . sodium chloride flush  3 mL Intravenous Q12H  . sodium chloride flush  5 mL Intracatheter Q8H  . Tbo-Filgrastim  480 mcg Subcutaneous 6 days   Continuous Infusions: . DAPTOmycin (CUBICIN)  IV    . potassium chloride    . sodium chloride 0.9 % 1,000  mL infusion 50 mL/hr at 10/06/17 0500     LOS: 4 days   Time Spent in minutes   45 minutes  Betzy Barbier D.O. on 10/06/2017 at 1:16 PM  Between 7am to 7pm - Pager - [phone]  After 7pm go to [email or web address] - password [email or web address]  And look for the night coverage person covering for me after hours  [facility] Office  [phone]

## 2017-10-06 NOTE — Progress Notes (Signed)
Paged Baltazar Najjar at this time to inform of patients decreased potassium & magnesium levels.

## 2017-10-06 NOTE — Progress Notes (Addendum)
Physical Therapy Treatment Patient Details Name: [name] MRN: [id number] DOB: 1956/03/18 Today's Date: 10/06/2017    History of Present Illness Pt with PMH of bilateral mastectomies for breast CA, chronic neutropenia, h/o ovarian and endometrial CA, h/o colostomy (2016), w/ takedown/loop ileostomy diversion as well as resection of ureteral stricture w/ bladder hitch reimplantation all in 03/2017, acute renal failure, colovesicular fistula with placement of bilateral nephrostomy tubes (09/2017). Pt [facility]'d from [facility] on 2/24 after >1 month hospitalization. Pt readmitted to [facility] on 10/02/2017 with pain, delirium and acute renal failure; found to have displacement of lt nephrostomy tube and exchange of rt nephrostomy tube.    PT Comments    Pt progressing well with mobility. Able to perform bed mobility and stand with RW and minA+2 (safety/line management). Ambulated short distance in room with RW and minA+2 for balance/lines. Pt remains limited by generalized weakness, fatigue, and c/o sacral wound discomfort. Continue to recommend continued SNF-level therapies to maximize functional mobility and independence prior to return home. Will follow acutely. Goals updated.   Follow Up Recommendations  SNF     Equipment Recommendations  None recommended by PT    Recommendations for Other Services       Precautions / Restrictions Precautions Precautions: Fall;Other (comment) Precaution Comments: bilateral nephrostomy tubes, colostomy, sacral wound Restrictions Weight Bearing Restrictions: No    Mobility  Bed Mobility Overal bed mobility: Needs Assistance Bed Mobility: Supine to Sit     Supine to sit: Min assist     General bed mobility comments: Prefers to exit on L-side of bed secondary to R-side sacral wound discomfort; increased time and effort  Transfers Overall transfer level: Needs assistance Equipment used: Rolling walker (2 wheeled) Transfers: Sit to/from Stand Sit to Stand:  Min assist;+2 safety/equipment;From elevated surface         General transfer comment: Pt able to stand on second attempt from elevated bed height with RW and minA for balance (+2 line management)  Ambulation/Gait Ambulation/Gait assistance: Min assist Ambulation Distance (Feet): 15 Feet Assistive device: Rolling walker (2 wheeled) Gait Pattern/deviations: Step-through pattern;Decreased stride length;Trunk flexed Gait velocity: Decreased Gait velocity interpretation: <1.8 ft/sec, indicative of risk for recurrent falls General Gait Details: Amb 45' with RW and minA+2 for balance and line management; pt c/o fatigue and discomfort secondary to sacral wound   Stairs            Wheelchair Mobility    Modified Rankin (Stroke Patients Only)       Balance Overall balance assessment: Needs assistance Sitting-balance support: Feet supported Sitting balance-Leahy Scale: Fair     Standing balance support: Bilateral upper extremity supported Standing balance-Leahy Scale: Poor Standing balance comment: Reliant on UE support                            Cognition Arousal/Alertness: Awake/alert Behavior During Therapy: Flat affect Overall Cognitive Status: Impaired/Different from baseline Area of Impairment: Following commands;Safety/judgement;Awareness;Problem solving                       Following Commands: Follows one step commands with increased time Safety/Judgement: Decreased awareness of safety;Decreased awareness of deficits Awareness: Emergent Problem Solving: Slow processing;Requires verbal cues;Requires tactile cues        Exercises      General Comments General comments (skin integrity, edema, etc.): Husband present throughout session and very supportive      Pertinent Vitals/Pain Pain Assessment: Faces Faces  Pain Scale: Hurts even more Pain Location: Sacral wound Pain Descriptors / Indicators: Grimacing;Guarding Pain Intervention(s):  Monitored during session;Repositioned    Home Living                      Prior Function            PT Goals (current goals can now be found in the care plan section) Acute Rehab PT Goals Patient Stated Goal: To feel better PT Goal Formulation: With patient/family Time For Goal Achievement: 10/20/17 Potential to Achieve Goals: Good Progress towards PT goals: Progressing toward goals    Frequency    Min 2X/week      PT Plan Current plan remains appropriate    Co-evaluation PT/OT/SLP Co-Evaluation/Treatment: Yes Reason for Co-Treatment: Complexity of the patient's impairments (multi-system involvement);For patient/therapist safety;To address functional/ADL transfers PT goals addressed during session: Mobility/safety with mobility;Balance;Proper use of DME        AM-PAC PT "6 Clicks" Daily Activity  Outcome Measure  Difficulty turning over in bed (including adjusting bedclothes, sheets and blankets)?: A Little Difficulty moving from lying on back to sitting on the side of the bed? : A Little Difficulty sitting down on and standing up from a chair with arms (e.g., wheelchair, bedside commode, etc,.)?: Unable Help needed moving to and from a bed to chair (including a wheelchair)?: A Little Help needed walking in hospital room?: A Little Help needed climbing 3-5 steps with a railing? : A Lot 6 Click Score: 15    End of Session Equipment Utilized During Treatment: (no gait belt due to nephrostomy tube sites and colostomy) Activity Tolerance: Patient limited by fatigue;Patient tolerated treatment well Patient left: in chair;with call bell/phone within reach;with family/visitor present Nurse Communication: Mobility status PT Visit Diagnosis: Other abnormalities of gait and mobility (R26.89);Muscle weakness (generalized) (M62.81);Difficulty in walking, not elsewhere classified (R26.2) Pain - part of body: ([place])     Time: 1610-9604 PT Time Calculation (min)  (ACUTE ONLY): 24 min  Charges:  $Therapeutic Activity: 8-22 mins                    G Codes:      Mabeline Caras, PT, DPT Acute Rehab Services  Pager: [place] 10/06/2017, 12:11 PM

## 2017-10-06 NOTE — Progress Notes (Signed)
Pharmacy Antibiotic Note  [name] is a 62 y.o. female admitted on 10/02/2017 with AMS concerning for sepsis of possible PNA or UTI source. ID on board. Pharmacy has been consulted for Daptomycin dosing - meropenem discontinued on 3/4.  Currently on day 5 of antibiotics. Noted AoCKD (BL 1.8-2, admit 3.74) which has been improving (today Scr 1.71), estimated CrCl ~34 mL/min. WBC at admit was 2.9, increased to 36 yesterday, decreasing to 21.7 today. CK on 3/7 was 18. Found to be growing MRSA in urine cultures, with no growth on blood cultures.   Plan: 1. Changed daptomycin 500 mg (~6 mg/kg) IV every 24 hours given improvement in SCr 2. Plan per ID to transition to linezolid as outpatient for total treatment of 2 weeks 3. Will continue to follow renal function, CK while on daptomycin, culture results, and clinical picture   Weight: 195 lb (88.5 kg)  Temp (24hrs), Avg:98 F (36.7 C), Min:97.3 F (36.3 C), Max:98.3 F (36.8 C)  Recent Labs  Lab 10/02/17 0729 10/02/17 1231 10/03/17 0253 10/04/17 0220 10/05/17 0500 10/05/17 0533 10/06/17 0240  WBC 2.9*  --  4.5 3.2* 36.0*  --  21.7*  CREATININE 3.74*  --  3.27* 2.78*  --  2.11* 1.71*  LATICACIDVEN  --  0.9  --   --   --   --   --     Estimated Creatinine Clearance: 34.8 mL/min (A) (by C-G formula based on SCr of 1.71 mg/dL (H)).    Allergies  Allergen Reactions  . Penicillins Swelling    Facial swelling/childhood allergy Has patient had a PCN reaction causing immediate rash, facial/tongue/throat swelling, SOB or lightheadedness with hypotension: Yes Has patient had a PCN reaction causing severe rash involving mucus membranes or skin necrosis: Yes Has patient had a PCN reaction that required hospitalization yes Has patient had a PCN reaction occurring within the last 10 years: No If all of the above answers are "NO", then may proceed with Cephalosporin use.   . Adhesive [Tape]     blisters  . Cefaclor Rash    Ceclor  .  Erythromycin Other (See Comments)    Gastritis, abd cramps  . Trimethoprim Rash  . Ultram [Tramadol] Hives  . Fluconazole Rash  . Oxycodone Other (See Comments)    " I just feel weird"  . Pectin Rash    Pectin ring for stoma  . Sulfa Antibiotics Rash    Antimicrobials this admission: Dapto 3/3 >> Mero 3/3 >>3/4  Dose adjustments this admission: 3/7: Daptomycin changed from q48 to q24 with improvement in renal function  Microbiology results: 3/3 BCx >> NGTD 3/3 UCx >>MRSA 3/3 UCx from R kidney>> MRSA 3/3 RCx >> sent  Thank you for allowing pharmacy to be a part of this patient's care.  Doylene Canard, PharmD Clinical Pharmacist  Pager: [phone] Clinical Phone for 10/06/2017 until 3:30pm: [phone] If after 3:30pm, please call main pharmacy at [phone] 10/06/2017 10:43 AM

## 2017-10-06 NOTE — Progress Notes (Signed)
Paged Schorr at this time to inform her of patients decreased potassium & magnesium levels

## 2017-10-06 NOTE — Progress Notes (Signed)
Patient brady down to 39 at this time. Patient is alert & oriented. She stated she has been know to have decreased heart rate at times in the past.  Morning labs have been sent off early. Will continue to monitor.

## 2017-10-06 NOTE — Consult Note (Addendum)
[facility] Nurse ostomy follow up: Physical therapy is following for hydrotherapy to sacrum wound.  Pt and husband states the ostomy pouch is intact with good seal.  They deny need for further assistance or supplies at this time and do not have any questions today. [facility] team will continue to visit patient for support during her hospital stay. Julien Girt MSN, RN, [place], [place], [place]

## 2017-10-06 NOTE — Progress Notes (Signed)
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 Pt dc'd from [facility] on 2/24 after >1 month hospitalization. Pt readmitted to [facility] on 10/02/2017 with pain, delirium and acute renal failure; found to have displacement of lt nephrostomy tube and exchange of rt nephrostomy tube.    OT comments  Pt progressing towards OT goals this session. Pt able to perform bed mobility at min A level and transfers/mobility with RW at min A (+2 for line management). Pt able to perform seated grooming (in recliner) with set up - quick to fatigue - very limited activity tolerance. Pt's husband present and supportive throughout session. Pt continues to require skilled OT in the acute setting and afterwards at SNF level to maximize safety and independence in ADL and functional transfers. Next session to focus on standing balance and education for energy conservation.   Follow Up Recommendations  SNF;Supervision/Assistance - 24 hour    Equipment Recommendations  Other (comment)(defer to next venue)    Recommendations for Other Services      Precautions / Restrictions Precautions Precautions: Fall;Other (comment) Precaution Comments: bilateral nephrostomy tubes, colostomy, sacral wound Restrictions Weight Bearing Restrictions: No       Mobility Bed Mobility Overal bed mobility: Needs Assistance Bed Mobility: Supine to Sit     Supine to sit: Min assist;HOB elevated     General bed mobility comments: Prefers to exit on L-side of bed secondary to R-side sacral  wound discomfort; increased time and effort  Transfers Overall transfer level: Needs assistance Equipment used: Rolling walker (2 wheeled) Transfers: Sit to/from Stand Sit to Stand: Min assist;+2 safety/equipment;From elevated surface         General transfer comment: Pt able to stand on second attempt from elevated bed height with RW and minA for balance (+2 line management)    Balance Overall balance assessment: Needs assistance Sitting-balance support: Feet supported Sitting balance-Leahy Scale: Fair Sitting balance - Comments: decreased sitting tolerance due to sacral wound Postural control: Right lateral lean(to offload pain) Standing balance support: Bilateral upper extremity supported Standing balance-Leahy Scale: Poor Standing balance comment: Reliant on UE support                           ADL either performed or assessed with clinical judgement   ADL Overall ADL's : Needs assistance/impaired                         Toilet Transfer: Minimal assistance;+2 for safety/equipment;RW;Ambulation Toilet Transfer Details (indicate cue type and reason): simulated through recliner transfer post-ambulation         Functional mobility during ADLs: Minimal assistance;+2 for safety/equipment;Rolling walker General ADL Comments: Pt demonstrating much improved activity tolerance and ability to perform ADL today - still requires assist due to weakness and decreased activity tolerance     Vision       Perception     Praxis      Cognition Arousal/Alertness: Awake/alert Behavior During Therapy: Flat affect Overall Cognitive Status: Impaired/Different from baseline Area of Impairment: Following commands;Safety/judgement;Awareness;Problem solving  Following Commands: Follows one step commands with increased time Safety/Judgement: Decreased awareness of safety;Decreased awareness of deficits Awareness: Emergent Problem  Solving: Slow processing;Requires verbal cues;Requires tactile cues          Exercises     Shoulder Instructions       General Comments Husband "Tim" present and supportive throughout session    Pertinent Vitals/ Pain       Pain Assessment: Faces Faces Pain Scale: Hurts even more Pain Location: Sacral wound Pain Descriptors / Indicators: Grimacing;Guarding Pain Intervention(s): Monitored during session;Repositioned  Home Living                                          Prior Functioning/Environment              Frequency  Min 2X/week        Progress Toward Goals  OT Goals(current goals can now be found in the care plan section)  Progress towards OT goals: Progressing toward goals  Acute Rehab OT Goals Patient Stated Goal: To feel better OT Goal Formulation: With patient/family Time For Goal Achievement: 10/17/17 Potential to Achieve Goals: Good  Plan Discharge plan remains appropriate;Frequency remains appropriate    Co-evaluation    PT/OT/SLP Co-Evaluation/Treatment: Yes Reason for Co-Treatment: Complexity of the patient's impairments (multi-system involvement);For patient/therapist safety;To address functional/ADL transfers PT goals addressed during session: Mobility/safety with mobility;Balance;Proper use of DME OT goals addressed during session: ADL's and self-care;Proper use of Adaptive equipment and DME      AM-PAC PT "6 Clicks" Daily Activity     Outcome Measure   Help from another person eating meals?: A Little Help from another person taking care of personal grooming?: A Little Help from another person toileting, which includes using toliet, bedpan, or urinal?: A Little Help from another person bathing (including washing, rinsing, drying)?: A Lot Help from another person to put on and taking off regular upper body clothing?: A Lot Help from another person to put on and taking off regular lower body clothing?: A Lot 6 Click  Score: 15    End of Session Equipment Utilized During Treatment: Rolling walker  OT Visit Diagnosis: Unsteadiness on feet (R26.81);Muscle weakness (generalized) (M62.81);Other symptoms and signs involving cognitive function;Pain Pain - Right/Left: Right Pain - part of body: (buttocks)   Activity Tolerance Patient tolerated treatment well   Patient Left in chair;with call bell/phone within reach;with nursing/sitter in room;with family/visitor present   Nurse Communication Mobility status        Time: 8099-8338 OT Time Calculation (min): 24 min  Charges: OT General Charges $OT Visit: 1 Visit OT Treatments $Therapeutic Activity: 8-22 mins  Hulda Humphrey OTR/L [place] 10/06/2017, 1:00 PM

## 2017-10-06 NOTE — Progress Notes (Signed)
Paged Dr. Blaine Hamper because midlevels would not return my page in regards to potassium & magnesium being low this morning.

## 2017-10-06 NOTE — Progress Notes (Signed)
Paged Baltazar Najjar again. In regards to patient low potassium & magnesium levels this morning.

## 2017-10-06 NOTE — Progress Notes (Signed)
[facility] Rounding Note  Subjective/Interval History:  Cultures all back + for MRSA Has been changed to Dapto for 2 weeks Will ultimately need a urinary diversion procedure and Dr. Tresa Moore says way down the road Good UOP Ileostomy output 1850!/24 hours Po not adequate to keep up with this Requiring daily K runs IV  Objective Vital signs in last 24 hours: Vitals:   10/05/17 1949 10/05/17 2339 10/06/17 0349 10/06/17 0400  BP: (!) 124/52 (!) 118/47 123/60 104/65  Pulse: 67 (!) 55 (!) 45 (!) 44  Resp: 17 18 14 12   Temp: 98.1 F (36.7 C) 98.1 F (36.7 C) 98.1 F (36.7 C)   TempSrc: Oral Oral Oral   SpO2: 97% 96% 99%   Weight:   88.5 kg (195 lb)    Weight change: -1.361 kg ()  Intake/Output Summary (Last 24 hours) at 10/06/2017 0546 Last data filed at 10/06/2017 0500 Gross per 24 hour  Intake 1397 ml  Output 3200 ml  Net -1803 ml   Physical Exam:  Blood pressure 104/65, pulse (!) 44, temperature 98.1 F (36.7 C), temperature source Oral, resp. rate 12, weight 88.5 kg (195 lb), SpO2 99 %.   Awake and alert L IJ 3L Coarse BS but lungs clear R4E3 No S3 1/6 systolic murmur no diastolic murmur Ostomy in place No LE edema PCN sites intact Sacral decub covered   Recent Labs  Lab 10/02/17 0729 10/03/17 0253 10/04/17 0220 10/05/17 0533 10/06/17 0240  NA 136 141 145 147* 137  K 4.0 3.6 2.4* 2.8* 3.2*  CL 109 112* 106 104 100*  CO2 14* 16* 27 29 28   GLUCOSE 123* 155* 83 126* 111*  BUN 52* 52* 49* 38* 35*  CREATININE 3.74* 3.27* 2.78* 2.11* 1.71*  CALCIUM 9.2 8.5* 8.1* 8.1* 7.9*  PHOS  --  5.3* 3.6 2.1* 2.8    Recent Labs  Lab 10/02/17 0729  10/04/17 0220 10/05/17 0533 10/06/17 0240  AST 15  --  18  --   --   ALT 20  --  19  --   --   ALKPHOS 248*  --  226*  --   --   BILITOT 0.4  --  0.7  --   --   PROT 7.7  --  5.6*  --   --   ALBUMIN 1.9*   < > 1.5* 1.6* 1.7*   < > = values in this interval not displayed.    Recent Labs  Lab 10/02/17 0729 10/03/17 0253  10/04/17 0220 10/05/17 0500 10/06/17 0240  WBC 2.9* 4.5 3.2* 36.0* 21.7*  NEUTROABS 1.5*  --   --   --   --   HGB 9.9* 8.3* 7.3* 9.1* 8.8*  HCT 32.2* 27.2* 23.6* 29.7* 28.3*  MCV 94.7 92.5 90.1 93.1 92.2  PLT 387 332 286 359 274   Results for ELLENA, KAMEN (MRN [id number]) as of 10/05/2017 09:11  Recent Labs  Lab 10/02/17 2221 10/03/17 0253  CKTOTAL 16* 18*   CBG: Recent Labs  Lab 10/04/17 2117 10/05/17 0816 10/05/17 1113 10/05/17 1650 10/05/17 2124  GLUCAP 135* 130* 147* 114* 153*   Results for TEMARA, LANUM (MRN [id number]) as of 10/06/2017 05:47  Ref. Range 09/21/2017 04:11 10/02/2017 22:21 10/04/2017 10:41 10/05/2017 05:00 10/06/2017 02:40  Magnesium Latest Ref Range: 1.7 - 2.4 mg/dL 1.9 1.1 (L) 1.7 1.5 (L) 1.6 (L)   Urine cultures all + MRSA Blood cultures negative to daye  Medications: . DAPTOmycin (CUBICIN)  IV Stopped (10/05/17 1639)  .  sodium chloride 0.9 % 1,000 mL infusion 50 mL/hr at 10/06/17 0500   . artificial tears  1 application Both Eyes QHS  . chlorhexidine  15 mL Mouth Rinse BID  . Chlorhexidine Gluconate Cloth  6 each Topical Q0600  . collagenase   Topical Daily  . enoxaparin (LOVENOX) injection  30 mg Subcutaneous Q24H  . feeding supplement (PRO-STAT SUGAR FREE 64)  30 mL Oral BID BM  . fentaNYL  25 mcg Transdermal Q72H  . insulin aspart  0-9 Units Subcutaneous TID WC  . levothyroxine  150 mcg Oral QAC breakfast  . mouth rinse  15 mL Mouth Rinse q12n4p  . multivitamin with minerals  1 tablet Oral Daily  . mupirocin ointment  1 application Nasal BID  . polyvinyl alcohol  1 drop Both Eyes QHS  . pregabalin  50 mg Oral BID  . protein supplement shake  11 oz Oral BID BM  . psyllium  1 packet Oral BID  . sodium chloride flush  10-40 mL Intracatheter Q12H  . sodium chloride flush  3 mL Intravenous Q12H  . sodium chloride flush  5 mL Intracatheter Q8H  . Tbo-Filgrastim  480 mcg Subcutaneous 6 days   Background: 62 y.o. 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 Fluid collection w/ CT guided drain placed on 09-16-17, removed 2/2 malpositioning. Cultures + last adm for  GBS, lactobacillus, serratia and yeast on cipro PTA. Adm 10/03/17 from SNF with fevers, AMS. IN ED L nephrostomy fell out w/purulent urine from site. We were asked to see for recurrent AKI on CKD creatinine up to 3.7  (most recent creatinine PTA new baseline of around 2; baseline prior to 08/2017 admit was around 02 Jun 2017).   Assessment/Recommendations  1. AKI on CKD - most recent (new) baseline creatinine around 2 prior to current admission. Last AKI was early 08/2017 w/creatinine at that time up to 4.28, had bilat perc tubes placed to divert urine from bladder (colovesical fistula), + vol depletion. Current AKI max creatinine 3.74 some vol depletion, could not exclude obstruction (L perc replaced after coming out, w/purulent urine from exit site, and R perc tube exchanged 10/02/17) and infection. Creatinine improving daily.  2. Volume depletion - poor po. NS at 50. Stool output nearly 2 liters and weight down a kg. ^ IVF rate to 75.  3. Hypokalemia - K runs X 4 past 23 days. Repeat X3 today and add oral KCL liquid. Need to try to transition. Intolerant of K tablets.  4. Hypomagnesemia - s/p 3 days Mg infusion. 1.6 today. Adding oral Mg. 5. Metabolic acidosis- Corrected. Stopped bicarb drip. When CO2 starts coming down then transition to po bicarb. 6. Urosepsis - all UC + MRSA. ID recommending dapto X 2 weeks 7. Chronic obstructive uropathy/colovesical fistula - L PCN replaced, R exchanged 3/3; ultimate need for surgical diversion down the road  8. Anemia - with prior Fe def (unable to give IV Fe for tsat of 7 last adm due to infection; similar issues  now). Transfuse prn.  9. Sacral decubitus - hydrotherapy  10. Chronic neutropenia - on chronic filagrastin   11. Encephalopathy improved with treatment of infection, restoration of volume 12. MRSA PCR +  I am told by pt's family that pt is imminent transfer back to SNF.  She is  requiring IVF at 62 to keep up with large losses that she is unable to take adequate po for Has required IV K runs past 4 days (incl today) and trying to find a K suppl that she can tolerate po Requiring daily labs at this time  Unclear to me how this can be accomplished at SNF at the present time.  [name], MD [facility] [phone] Pager 10/06/2017, 8:44 AM

## 2017-10-06 NOTE — Progress Notes (Signed)
Nutrition Follow-up  DOCUMENTATION CODES:   Obesity unspecified  INTERVENTION:   -Continue MVI daily -D/c 30 ml Prostat to BID, each supplement provides 100 kcals and 15 grams protein -Increase Premier Protein to TID, each supplement provides 160 kcals and 30 grams protein  NUTRITION DIAGNOSIS:   Increased nutrient needs related to wound healing as evidenced by estimated needs.  Ongoing  GOAL:   Patient will meet greater than or equal to 90% of their needs  Progressing  MONITOR:   PO intake, Supplement acceptance, Labs, Weight trends, Skin, I & O's, Diet advancement  REASON FOR ASSESSMENT:   Consult Poor PO  ASSESSMENT:   62 y.o. female  with past medical history of endometrial cancer and complicated surgical history including hysterectomy, chronic stent placement, colostomy reversal with ureteral construction and subsequent bladder leak with colovesicular fistula who had bilateral nephrostomy tubes placed in February admitted on 10/02/2017 with confusion and lethargy likely subsequent from sepsis related to UTI.   3/3- s/p bilateral PCN replacement and exchange by IR 3/5- s/p BSE- pt much more alert; plan to continue with full liquid diet with plans for advancement 3/6- hydrotherapy initiaited  Reviewed I/O's: -1.4 L x 24 hours and + 2 L since admission.   Case discussed with RN, who confirms advancement to carb modified diet. Per RN, pt is more alert and intake has improved. Meal completion 50-75%. Pt does not like Prostat supplement, but does take Premier Protein (only likes chocolate flavor.   RN also states wounds have improved and hydrotherapy continues.   Labs reviewed: K: 3.2 (on PO supplementation), Mg: 1.6 (on PO supplementation), CBGS: 108-153 (inpatient orders for glycemic control are 0-9 units insulin aspart TID with meals).  Diet Order:  Diet Carb Modified Fluid consistency: Thin; Room service appropriate? Yes  EDUCATION NEEDS:   Not appropriate for  education at this time  Skin:  Skin Assessment: Skin Integrity Issues: Skin Integrity Issues:: Stage I, Unstageable Stage I: lt heel Stage II: n/a Unstageable: sacrum x 3  Last BM:  10/06/17 (150 ml via ileostomy)  Height:   Ht Readings from Last 1 Encounters:  10/06/17 5\' 2"  (1.575 m)    Weight:   Wt Readings from Last 1 Encounters:  10/06/17 195 lb (88.5 kg)    Ideal Body Weight:  50 kg  BMI:  Body mass index is 35.67 kg/m.  Estimated Nutritional Needs:   Kcal:  3094-0768  Protein:  100-115 grams  Fluid:  > 1.7 L    Sloane Palmer A. Jimmye Norman, RD, LDN, CDE Pager: [phone] After hours Pager: [phone]

## 2017-10-06 NOTE — Progress Notes (Signed)
Physical Therapy Wound Treatment Patient Details  Name: [name] MRN: [id number] Date of Birth: Dec 22, 1955  Today's Date: 10/06/2017 Time: 2355-7322 Time Calculation (min): 47 min  Subjective  Subjective: I'm feeling a little better today Patient and Family Stated Goals: to heal   Pain Score:  Pt premedicated prior to treatment. Pt became tearful during session reporting 10/10 pain.   Wound Assessment  Pressure Injury 10/02/17 Unstageable - Full thickness tissue loss in which the base of the ulcer is covered by slough (yellow, tan, gray, green or brown) and/or eschar (tan, brown or black) in the wound bed. Sacrum 9 x12  open with yellow wound base puru (Active)  Wound Image   10/05/2017  6:00 PM  Dressing Type ABD;Barrier Film (skin prep);Moist to dry;Other (Comment) 10/06/2017  4:00 PM  Dressing Clean;Dry;Intact 10/06/2017  4:00 PM  Dressing Change Frequency Daily 10/06/2017  4:00 PM  State of Healing Early/partial granulation 10/06/2017  4:00 PM  Site / Wound Assessment Pink;Yellow;Red;Painful 10/06/2017  4:00 PM  % Wound base Red or Granulating 65% 10/06/2017  4:00 PM  % Wound base Yellow/Fibrinous Exudate 35% 10/06/2017  4:00 PM  Peri-wound Assessment Erythema (blanchable);Maceration 10/06/2017  4:00 PM  Wound Length (cm) 9 cm 10/04/2017 12:00 PM  Wound Width (cm) 12 cm 10/04/2017 12:00 PM  Wound Surface Area (cm^2) 108 cm^2 10/04/2017 12:00 PM  Margins Unattached edges (unapproximated) 10/06/2017  4:00 PM  Drainage Amount Minimal 10/06/2017  4:00 PM  Drainage Description Purulent;Serosanguineous 10/06/2017  4:00 PM  Treatment Cleansed;Debridement (Selective);Hydrotherapy (Pulse lavage);Packing (Saline gauze) 10/06/2017  4:00 PM   Santyl applied to wound bed prior to dressing   Hydrotherapy Pulsed lavage therapy - wound location: sacrum Pulsed Lavage with Suction (psi): 4 psi(4-8 psi due to pain) Pulsed Lavage with Suction - Normal Saline Used: 1000 mL Pulsed Lavage Tip: Tip with splash shield Selective  Debridement Selective Debridement - Location: sacrum Selective Debridement - Tools Used: Forceps;Scissors Selective Debridement - Tissue Removed: necrotic, yellow slough   Wound Assessment and Plan  Wound Therapy - Assess/Plan/Recommendations Wound Therapy - Clinical Statement: Pt sat in chair for a couple of hours prior to wound care, as a result area around wound was very red initially when dressing removed. Pt educated on need for shorter bouts of sitting. Wound countinues to have area of closely adhered slough.  Wound Therapy - Functional Problem List: Decreased sitting tolerance Factors Delaying/Impairing Wound Healing: Diabetes Mellitus;Immobility Hydrotherapy Plan: Debridement;Dressing change;Patient/family education;Pulsatile lavage with suction Wound Therapy - Frequency: 6X / week Wound Therapy - Follow Up Recommendations: Skilled nursing facility Wound Plan: see above  Wound Therapy Goals- Improve the function of patient's integumentary system by progressing the wound(s) through the phases of wound healing (inflammation - proliferation - remodeling) by: Decrease Necrotic Tissue to: 10 Decrease Necrotic Tissue - Progress: Progressing toward goal Increase Granulation Tissue to: 90 Increase Granulation Tissue - Progress: Progressing toward goal Goals/treatment plan/discharge plan were made with and agreed upon by patient/family: Yes Time For Goal Achievement: 7 days Wound Therapy - Potential for Goals: Good  Goals will be updated until maximal potential achieved or discharge criteria met.  Discharge criteria: when goals achieved, discharge from hospital, MD decision/surgical intervention, no progress towards goals, refusal/missing three consecutive treatments without notification or medical reason.  GP    Dani Gobble. Migdalia Dk PT, DPT Acute Rehabilitation  [phone] Pager [phone]  [place] 10/06/2017, 5:01 PM

## 2017-10-07 LAB — GLUCOSE, CAPILLARY
GLUCOSE-CAPILLARY: 127 mg/dL — AB (ref 65–99)
Glucose-Capillary: 102 mg/dL — ABNORMAL HIGH (ref 65–99)
Glucose-Capillary: 154 mg/dL — ABNORMAL HIGH (ref 65–99)
Glucose-Capillary: 98 mg/dL (ref 65–99)

## 2017-10-07 LAB — RENAL FUNCTION PANEL
ALBUMIN: 1.9 g/dL — AB (ref 3.5–5.0)
Anion gap: 8 (ref 5–15)
BUN: 29 mg/dL — ABNORMAL HIGH (ref 6–20)
CALCIUM: 8.1 mg/dL — AB (ref 8.9–10.3)
CO2: 27 mmol/L (ref 22–32)
CREATININE: 1.38 mg/dL — AB (ref 0.44–1.00)
Chloride: 103 mmol/L (ref 101–111)
GFR calc non Af Amer: 40 mL/min — ABNORMAL LOW (ref >60)
GFR, EST AFRICAN AMERICAN: 46 mL/min — AB (ref >60)
GLUCOSE: 110 mg/dL — AB (ref 65–99)
PHOSPHORUS: 3.1 mg/dL (ref 2.5–4.6)
Potassium: 4.1 mmol/L (ref 3.5–5.1)
SODIUM: 138 mmol/L (ref 135–145)

## 2017-10-07 LAB — CBC
HCT: 31.3 % — ABNORMAL LOW (ref 36.0–46.0)
Hemoglobin: 9.4 g/dL — ABNORMAL LOW (ref 12.0–15.0)
MCH: 28.1 pg (ref 26.0–34.0)
MCHC: 30 g/dL (ref 30.0–36.0)
MCV: 93.4 fL (ref 78.0–100.0)
PLATELETS: 280 10*3/uL (ref 150–400)
RBC: 3.35 MIL/uL — ABNORMAL LOW (ref 3.87–5.11)
RDW: 16 % — ABNORMAL HIGH (ref 11.5–15.5)
WBC: 16.4 10*3/uL — ABNORMAL HIGH (ref 4.0–10.5)

## 2017-10-07 LAB — CULTURE, BLOOD (ROUTINE X 2)
Culture: NO GROWTH
Culture: NO GROWTH
Special Requests: ADEQUATE
Special Requests: ADEQUATE

## 2017-10-07 MED ORDER — ENOXAPARIN SODIUM 40 MG/0.4ML ~~LOC~~ SOLN
40.0000 mg | SUBCUTANEOUS | Status: DC
  Administered 2017-10-07 – 2017-10-12 (×6): 40 mg via SUBCUTANEOUS
  Filled 2017-10-07 (×6): qty 0.4

## 2017-10-07 NOTE — Consult Note (Signed)
[facility] Nurse wound consult note I stopped in to check on the patient and to inquire if there was anything she or her husband needed, or that I could help with.  The patient was eating a breakfast sandwich.  She and her husband were smiling, relaxed, and happy to inform me that all is going well at this time, and the pouch her husband applies has a wear time of 4 days without leakage.  They denied needing anything additional at this point, and thanked me for stopping by.  Val Riles, RN, MSN, CWOCN, CNS-BC, pager [phone]

## 2017-10-07 NOTE — Progress Notes (Signed)
PROGRESS NOTE    [name]  FVC:[id number] DOB: 1955-11-30 DOA: 10/02/2017 PCP: Ann Held, DO   Brief Narrative:  62 year old female with history of endometrial cancer, ovarian cancer, diabetes, hypothyroidism, hypertension, chronic kidney disease who presented with worsening delirium and renal function.  Found to have ureteral stent infection with MRSA, placed on daptomycin.  Infectious disease consulted and appreciated, recommending discharge with Jeani Hawking nasal lid twice daily for 2 weeks.  Nephrology also consulted and following.  Neurology consulted and feels patient would need urinary diversion procedure however will be sometime in the future.  Currently pending SNF placement.  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 -Patient had replacement of left PCN-was noted to have purulent urine from exit site. -Right percutaneous tube was exchanged on 10/02/2017 -Creatinine currently 1.38 -Nephrology consulted and appreciated  Sepsis secondary to MRSA UTI/Stent infection -patient was tachycardic with tachypnea on the day of admission -Patient also found to have neutropenia however that is chronic, currently WBCs are up to 36 -UA showed many bacteria, TNTC WBC, large leukocytes -Blood cultures no growth to date -Urine culture shows MRSA -as noted above, with replacement of left PCN, purulent urine was noted -Infectious disease consulted and appreciated, currently on daptomycin.  Recommended use of linezolid for extended 2 weeks at discharge  Hypomagnesemia/hypokalemia -resolved, continue to monitor   Metabolic acidosis -Please secondary to AKI, patient did  require bicarbonate drip which was discontinued  Anemia secondary to chronic disease/iron deficiency -Hemoglobin dropped to 7.3, currently 9.4 (after transfusion) -Continue to monitor CBC  Sacral decubitus ulcer -PT currently advising hydrotherapy -discussed with PT, will likely continue hydrotherapy through 10/08/2017 and will reevaluate on 3/11 for additional hydrotherapy needs  Encephalopathy, metabolic -Appears to have improved, suspect likely secondary to infection and acute kidney injury  Metastatic endometrial cancer/chronic neutropenia -Patient takes filgastrin at home  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 -Urology consulted and appreciated, patient will likely need diversion at some point -patient with high output and needing IVF -have placed order for RN to document intake/output  Hypothyroidism -Continue Synthroid  Diabetes mellitus, type II -Continue insulin sliding scale, CBG monitoring -lantus currently held due to poor oral intake -have placed patient on regular diet (have told husband and RN that we will liberalize her diet to aid in her nutritional status   Protein calorie malnutrition, moderate -Patient consulted, continue supplements -see discussion above  Goals of care/pain control -palliative care consulted and appreciated, continues to be full code  DVT Prophylaxis  lovenox  Code Status: Full  Family Communication: Husband at bedside  Disposition Plan: Admitted, continue to monitor in stepdown. Pending SNF when medically stable.   Consultants Infectious disease Nephrology Interventional radiology Urology Palliative care  Procedures  Left PCN replacement and exchange of right PCN interventional radiology  Antibiotics   Anti-infectives (From admission, onward)   Start     Dose/Rate Route Frequency Ordered Stop   10/06/17 1600  DAPTOmycin (CUBICIN) 500 mg in sodium chloride 0.9 % IVPB     500 mg 220 mL/hr over  30 Minutes Intravenous Every 24 hours 10/06/17 1041     10/04/17 1200  vancomycin (VANCOCIN) IVPB 1000 mg/200 mL premix  Status:  Discontinued     1,000 mg 200 mL/hr over 60 Minutes Intravenous Every 48 hours 10/02/17 0903 10/02/17 1539   10/04/17 0800  levofloxacin (LEVAQUIN) IVPB 500 mg  Status:  Discontinued     500 mg 100 mL/hr over 60 Minutes Intravenous Every 48 hours 10/02/17 0903 10/02/17 1539   10/03/17 1600  DAPTOmycin (CUBICIN) 500 mg in sodium chloride 0.9 % IVPB  Status:  Discontinued     500 mg 220 mL/hr over 30 Minutes Intravenous Every 48 hours 10/02/17 1603 10/06/17 1041   10/02/17 2000  aztreonam (AZACTAM) 500 mg in dextrose 5 % 50 mL IVPB  Status:  Discontinued     500 mg 100 mL/hr over 30 Minutes Intravenous Every 8 hours 10/02/17 0903 10/02/17 1539   10/02/17 2000  meropenem (MERREM) 500 mg in sodium chloride 0.9 % 100 mL IVPB  Status:  Discontinued     500 mg 200 mL/hr over 30 Minutes Intravenous Every 12 hours 10/02/17 1603 10/04/17 0906   10/02/17 0915  aztreonam (AZACTAM) 2 g in sodium chloride 0.9 % 100 mL IVPB     2 g 200 mL/hr over 30 Minutes Intravenous  Once 10/02/17 0902 10/02/17 1007   10/02/17 0915  vancomycin (VANCOCIN) 1,500 mg in sodium chloride 0.9 % 500 mL IVPB     1,500 mg 250 mL/hr over 120 Minutes Intravenous  Once 10/02/17 0902 10/02/17 1243   10/02/17 0730  levofloxacin (LEVAQUIN) IVPB 750 mg     750 mg 100 mL/hr over 90 Minutes Intravenous  Once 10/02/17 0723 10/02/17 0915   10/02/17 0730  aztreonam (AZACTAM) 2 g in sodium chloride 0.9 % 100 mL IVPB  Status:  Discontinued     2 g 200 mL/hr over 30 Minutes Intravenous  Once 10/02/17 0723 10/02/17 0902   10/02/17 0730  vancomycin (VANCOCIN) IVPB 1000 mg/200 mL premix  Status:  Discontinued     1,000 mg 200 mL/hr over 60 Minutes Intravenous  Once 10/02/17 0723 10/02/17 0902      Subjective:   [name] Revell seen and examined today.  No complaints today.  States she does not like eating the  diet that she has here at the hospital.  Denies current chest pain, shortness of breath, abdominal pain, nausea or vomiting, dizziness or headache.  Objective:   Vitals:   10/07/17 0541 10/07/17 0820 10/07/17 0830 10/07/17 1116  BP: (!) 151/66   (!) 131/59  Pulse: 70 65  70  Resp: 16 20 20  (!) 21  Temp: (!) 97.5 F (36.4 C)   98.1 F (36.7 C)  TempSrc: Oral   Oral  SpO2: 100% 98%  100%  Weight: 88.9 kg (196 lb)     Height:        Intake/Output Summary (Last 24 hours) at 10/07/2017 1357 Last data filed at 10/07/2017 1300 Gross per 24 hour  Intake 4430 ml  Output 5200 ml  Net -770 ml   Filed Weights   10/05/17 0630 10/06/17 0349 10/07/17 0541  Weight: 89.8 kg (198 lb) 88.5 kg (195 lb) 88.9 kg (196 lb)   Exam  General: Well developed, well nourished, NAD, appears stated age  HEENT: NCAT, PERRLA, EOMI, Anicteic Sclera, mucous membranes moist.   Neck: Supple, no JVD, no masses  Cardiovascular: S1 S2 auscultated, RRR, soft SEM  Respiratory: Clear to auscultation bilaterally with equal chest rise  Abdomen: Soft, nontender, nondistended, + bowel sounds  Extremities: warm dry without cyanosis clubbing or edema  Neuro: AAOx3, nonfocal  Psych: pleasant,  appropriate mood and affect  Data Reviewed: I have personally reviewed following labs and imaging studies  CBC: Recent Labs  Lab 10/02/17 0729 10/03/17 0253 10/04/17 0220 10/05/17 0500 10/06/17 0240 10/07/17 0500  WBC 2.9* 4.5 3.2* 36.0* 21.7* 16.4*  NEUTROABS 1.5*  --   --   --   --   --   HGB 9.9* 8.3* 7.3* 9.1* 8.8* 9.4*  HCT 32.2* 27.2* 23.6* 29.7* 28.3* 31.3*  MCV 94.7 92.5 90.1 93.1 92.2 93.4  PLT 387 332 286 359 274 935   Basic Metabolic Panel: Recent Labs  Lab 10/02/17 2221 10/03/17 0253 10/04/17 0220 10/04/17 1041 10/05/17 0500 10/05/17 0533 10/06/17 0240 10/07/17 0500  NA  --  141 145  --   --  147* 137 138  K  --  3.6 2.4*  --   --  2.8* 3.2* 4.1  CL  --  112* 106  --   --  104 100* 103  CO2   --  16* 27  --   --  29 28 27   GLUCOSE  --  155* 83  --   --  126* 111* 110*  BUN  --  52* 49*  --   --  38* 35* 29*  CREATININE  --  3.27* 2.78*  --   --  2.11* 1.71* 1.38*  CALCIUM  --  8.5* 8.1*  --   --  8.1* 7.9* 8.1*  MG 1.1*  --   --  1.7 1.5*  --  1.6*  --   PHOS  --  5.3* 3.6  --   --  2.1* 2.8 3.1   GFR: Estimated Creatinine Clearance: 43.2 mL/min (A) (by C-G formula based on SCr of 1.38 mg/dL (H)). Liver Function Tests: Recent Labs  Lab 10/02/17 0729 10/03/17 0253 10/04/17 0220 10/05/17 0533 10/06/17 0240 10/07/17 0500  AST 15  --  18  --   --   --   ALT 20  --  19  --   --   --   ALKPHOS 248*  --  226*  --   --   --   BILITOT 0.4  --  0.7  --   --   --   PROT 7.7  --  5.6*  --   --   --   ALBUMIN 1.9* 1.5* 1.5* 1.6* 1.7* 1.9*   No results for input(s): LIPASE, AMYLASE in the last 168 hours. No results for input(s): AMMONIA in the last 168 hours. Coagulation Profile: Recent Labs  Lab 10/02/17 2221  INR 1.34   Cardiac Enzymes: Recent Labs  Lab 10/02/17 2221 10/03/17 0253  CKTOTAL 16* 18*   BNP (last 3 results) No results for input(s): PROBNP in the last 8760 hours. HbA1C: No results for input(s): HGBA1C in the last 72 hours. CBG: Recent Labs  Lab 10/06/17 1150 10/06/17 1659 10/06/17 2059 10/07/17 0756 10/07/17 1221  GLUCAP 167* 121* 134* 102* 154*   Lipid Profile: No results for input(s): CHOL, HDL, LDLCALC, TRIG, CHOLHDL, LDLDIRECT in the last 72 hours. Thyroid Function Tests: No results for input(s): TSH, T4TOTAL, FREET4, T3FREE, THYROIDAB in the last 72 hours. Anemia Panel: No results for input(s): VITAMINB12, FOLATE, FERRITIN, TIBC, IRON, RETICCTPCT in the last 72 hours. Urine analysis:    Component Value Date/Time   COLORURINE YELLOW 10/02/2017 0836   APPEARANCEUR CLOUDY (A) 10/02/2017 0836   LABSPEC 1.025 10/02/2017 0836   LABSPEC 1.015 11/17/2015 1436   PHURINE 5.5 10/02/2017 0836   GLUCOSEU  NEGATIVE 10/02/2017 0836   GLUCOSEU  2,000 11/17/2015 1436   HGBUR MODERATE (A) 10/02/2017 0836   BILIRUBINUR NEGATIVE 10/02/2017 0836   BILIRUBINUR neg 08/30/2016 1025   BILIRUBINUR Negative 11/17/2015 1436   KETONESUR 15 (A) 10/02/2017 0836   PROTEINUR >300 (A) 10/02/2017 0836   UROBILINOGEN negative 08/30/2016 1025   UROBILINOGEN 0.2 11/17/2015 1436   NITRITE NEGATIVE 10/02/2017 0836   LEUKOCYTESUR LARGE (A) 10/02/2017 0836   LEUKOCYTESUR Moderate 11/17/2015 1436   Sepsis Labs: @LABRCNTIP (procalcitonin:4,lacticidven:4)  ) Recent Results (from the past 240 hour(s))  Blood culture (routine x 2)     Status: None (Preliminary result)   Collection Time: 10/02/17  7:00 AM  Result Value Ref Range Status   Specimen Description BLOOD LEFT ANTECUBITAL  Final   Special Requests IN PEDIATRIC BOTTLE Blood Culture adequate volume  Final   Culture   Final    NO GROWTH 4 DAYS Performed at [facility], [place] [address], [place], [place] [address]    Report Status PENDING  Incomplete  Blood culture (routine x 2)     Status: None (Preliminary result)   Collection Time: 10/02/17  7:25 AM  Result Value Ref Range Status   Specimen Description BLOOD LEFT HAND  Final   Special Requests IN PEDIATRIC BOTTLE Blood Culture adequate volume  Final   Culture   Final    NO GROWTH 4 DAYS Performed at [facility], [place] [address], [place], [place] [address]    Report Status PENDING  Incomplete  Urine culture     Status: Abnormal   Collection Time: 10/02/17  8:39 AM  Result Value Ref Range Status   Specimen Description URINE, RANDOM  Final   Special Requests   Final    NONE Performed at [facility], [place] [address], [place], [place] [address]    Culture MULTIPLE SPECIES PRESENT, SUGGEST RECOLLECTION (A)  Final   Report Status 10/04/2017 FINAL  Final  Urine culture     Status: Abnormal   Collection Time: 10/02/17  2:20 PM  Result Value Ref Range Status   Specimen Description URINE, CATHETERIZED  Final   Special  Requests   Final    NONE Performed at [facility], [place] [address], [place], [place] [address]    Culture (A)  Final    20,000 COLONIES/mL METHICILLIN RESISTANT STAPHYLOCOCCUS AUREUS   Report Status 10/04/2017 FINAL  Final   Organism ID, Bacteria METHICILLIN RESISTANT STAPHYLOCOCCUS AUREUS (A)  Final      Susceptibility   Methicillin resistant staphylococcus aureus - MIC*    CIPROFLOXACIN >=8 RESISTANT Resistant     GENTAMICIN <=0.5 SENSITIVE Sensitive     NITROFURANTOIN <=16 SENSITIVE Sensitive     OXACILLIN >=4 RESISTANT Resistant     TETRACYCLINE <=1 SENSITIVE Sensitive     VANCOMYCIN <=0.5 SENSITIVE Sensitive     TRIMETH/SULFA >=320 RESISTANT Resistant     CLINDAMYCIN <=0.25 SENSITIVE Sensitive     RIFAMPIN <=0.5 SENSITIVE Sensitive     Inducible Clindamycin NEGATIVE Sensitive     * 20,000 COLONIES/mL METHICILLIN RESISTANT STAPHYLOCOCCUS AUREUS  Urine Culture     Status: Abnormal   Collection Time: 10/02/17  6:31 PM  Result Value Ref Range Status   Specimen Description KIDNEY RIGHT  Final   Special Requests   Final    NONE Performed at [facility], [place] [address], [place], [place] [address]    Culture (A)  Final    50,000 COLONIES/mL  METHICILLIN RESISTANT STAPHYLOCOCCUS AUREUS   Report Status 10/04/2017 FINAL  Final   Organism ID, Bacteria METHICILLIN RESISTANT STAPHYLOCOCCUS AUREUS (A)  Final      Susceptibility   Methicillin resistant staphylococcus aureus - MIC*    CIPROFLOXACIN >=8 RESISTANT Resistant     ERYTHROMYCIN >=8 RESISTANT Resistant     GENTAMICIN <=0.5 SENSITIVE Sensitive     OXACILLIN >=4 RESISTANT Resistant     TETRACYCLINE <=1 SENSITIVE Sensitive     VANCOMYCIN 1 SENSITIVE Sensitive     TRIMETH/SULFA >=320 RESISTANT Resistant     CLINDAMYCIN <=0.25 SENSITIVE Sensitive     RIFAMPIN <=0.5 SENSITIVE Sensitive     Inducible Clindamycin NEGATIVE Sensitive     * 50,000 COLONIES/mL METHICILLIN RESISTANT STAPHYLOCOCCUS AUREUS  MRSA PCR Screening      Status: Abnormal   Collection Time: 10/02/17  6:54 PM  Result Value Ref Range Status   MRSA by PCR POSITIVE (A) NEGATIVE Final    Comment:        The GeneXpert MRSA Assay (FDA approved for NASAL specimens only), is one component of a comprehensive MRSA colonization surveillance program. It is not intended to diagnose MRSA infection nor to guide or monitor treatment for MRSA infections. RESULT CALLED TO, READ BACK BY AND VERIFIED WITH: TDECAREAUX,RN @2050  10/02/17 BY LHOWARD Performed at [facility], [place] [name], [place], [place] [address]   Urine culture     Status: Abnormal   Collection Time: 10/02/17  7:35 PM  Result Value Ref Range Status   Specimen Description KIDNEY RIGHT  Final   Special Requests   Final    Normal Performed at [facility], [place] [address], [place],  [address]    Culture (A)  Final    >=100,000 COLONIES/mL METHICILLIN RESISTANT STAPHYLOCOCCUS AUREUS   Report Status 10/04/2017 FINAL  Final   Organism ID, Bacteria METHICILLIN RESISTANT STAPHYLOCOCCUS AUREUS (A)  Final      Susceptibility   Methicillin resistant staphylococcus aureus - MIC*    CIPROFLOXACIN >=8 RESISTANT Resistant     ERYTHROMYCIN >=8 RESISTANT Resistant     GENTAMICIN <=0.5 SENSITIVE Sensitive     OXACILLIN >=4 RESISTANT Resistant     TETRACYCLINE <=1 SENSITIVE Sensitive     VANCOMYCIN 1 SENSITIVE Sensitive     TRIMETH/SULFA >=320 RESISTANT Resistant     CLINDAMYCIN <=0.25 SENSITIVE Sensitive     RIFAMPIN <=0.5 SENSITIVE Sensitive     Inducible Clindamycin NEGATIVE Sensitive     * >=100,000 COLONIES/mL METHICILLIN RESISTANT STAPHYLOCOCCUS AUREUS      Radiology Studies: No results found.   Scheduled Meds: . artificial tears  1 application Both Eyes QHS  . collagenase   Topical Daily  . enoxaparin (LOVENOX) injection  40 mg Subcutaneous Q24H  . fentaNYL  25 mcg Transdermal Q72H  . insulin aspart  0-9 Units Subcutaneous TID WC  . levothyroxine  150  mcg Oral QAC breakfast  . magnesium oxide  800 mg Oral BID  . mouth rinse  15 mL Mouth Rinse BID  . multivitamin with minerals  1 tablet Oral Daily  . nystatin  5 mL Oral QID  . polyvinyl alcohol  1 drop Both Eyes QHS  . potassium chloride  20 mEq Oral BID  . pregabalin  50 mg Oral BID  . protein supplement shake  11 oz Oral TID BM  . psyllium  1 packet Oral BID  . sodium chloride flush  10-40 mL Intracatheter Q12H  . sodium chloride flush  3 mL Intravenous Q12H  . sodium chloride flush  5 mL Intracatheter Q8H  . Tbo-Filgrastim  480 mcg Subcutaneous 6 days   Continuous Infusions: . DAPTOmycin (CUBICIN)  IV Stopped (10/06/17 1610)  . sodium chloride 0.9 % 1,000 mL infusion 10 mL/hr at 10/07/17 1240     LOS: 5 days   Time Spent in minutes   30 minutes  Jeff Frieden D.O. on 10/07/2017 at 1:57 PM  Between 7am to 7pm - Pager - [phone]  After 7pm go to [email or web address] - password [email or web address]  And look for the night coverage person covering for me after hours  [facility] Office  [phone]

## 2017-10-07 NOTE — Progress Notes (Addendum)
[facility] Rounding Note  Subjective/Interval History:   Po intake 2.2 liters yesterday Urine + stool output 2.7 liters Not quite getting there but better Renal function best it has been in weeks!  Looks best I have seen her (ever) Tolerating her po KCL liquid and working hard on po fluids  Objective Vital signs in last 24 hours: Vitals:   10/06/17 2341 10/07/17 0541 10/07/17 0820 10/07/17 0830  BP: (!) 120/44 (!) 151/66    Pulse: 64 70 65   Resp: 19 16 20 20   Temp: 98.4 F (36.9 C) (!) 97.5 F (36.4 C)    TempSrc: Oral Oral    SpO2: 98% 100% 98%   Weight:  88.9 kg (196 lb)    Height:       Weight change: 0.454 kg (1 lb)  Intake/Output Summary (Last 24 hours) at 10/07/2017 1022 Last data filed at 10/07/2017 0942 Gross per 24 hour  Intake 3640 ml  Output 5075 ml  Net -1435 ml   Physical Exam:  Blood pressure (!) 151/66, pulse 65, temperature (!) 97.5 F (36.4 C), temperature source Oral, resp. rate 20, height 5\' 2"  (1.575 m), weight 88.9 kg (196 lb), SpO2 98 %.   Awake and alert L IJ 3L Coarse BS but lungs clear Q3F3 No S3 1/6 systolic murmur no diastolic murmur Ostomy in place No LE edema PCN sites intact Sacral decub covered   Recent Labs  Lab 10/02/17 0729 10/03/17 0253 10/04/17 0220 10/05/17 0533 10/06/17 0240 10/07/17 0500  NA 136 141 145 147* 137 138  K 4.0 3.6 2.4* 2.8* 3.2* 4.1  CL 109 112* 106 104 100* 103  CO2 14* 16* 27 29 28 27   GLUCOSE 123* 155* 83 126* 111* 110*  BUN 52* 52* 49* 38* 35* 29*  CREATININE 3.74* 3.27* 2.78* 2.11* 1.71* 1.38*  CALCIUM 9.2 8.5* 8.1* 8.1* 7.9* 8.1*  PHOS  --  5.3* 3.6 2.1* 2.8 3.1    Recent Labs  Lab 10/02/17 0729  10/04/17 0220 10/05/17 0533 10/06/17 0240 10/07/17 0500  AST 15  --  18  --   --   --   ALT 20  --  19  --   --   --   ALKPHOS 248*  --  226*  --   --   --   BILITOT 0.4  --  0.7  --   --   --   PROT 7.7  --  5.6*  --   --   --   ALBUMIN 1.9*   < > 1.5* 1.6* 1.7* 1.9*   < > = values in this  interval not displayed.    Recent Labs  Lab 10/02/17 0729  10/04/17 0220 10/05/17 0500 10/06/17 0240 10/07/17 0500  WBC 2.9*   < > 3.2* 36.0* 21.7* 16.4*  NEUTROABS 1.5*  --   --   --   --   --   HGB 9.9*   < > 7.3* 9.1* 8.8* 9.4*  HCT 32.2*   < > 23.6* 29.7* 28.3* 31.3*  MCV 94.7   < > 90.1 93.1 92.2 93.4  PLT 387   < > 286 359 274 280   < > = values in this interval not displayed.   Results for [name], [name] (MRN [id number]) as of 10/05/2017 09:11  Recent Labs  Lab 10/02/17 2221 10/03/17 0253  CKTOTAL 16* 18*   CBG: Recent Labs  Lab 10/06/17 0810 10/06/17 1150 10/06/17 1659 10/06/17 2059 10/07/17 0756  GLUCAP 108*  167* 121* 134* 102*   Results for [name], [name] (MRN [id number]) as of 10/06/2017 05:47  Urine cultures all + MRSA Blood cultures negative to date  Medications: . DAPTOmycin (CUBICIN)  IV Stopped (10/06/17 1610)  . sodium chloride 0.9 % 1,000 mL infusion 75 mL/hr at 10/06/17 1800   . artificial tears  1 application Both Eyes QHS  . collagenase   Topical Daily  . enoxaparin (LOVENOX) injection  40 mg Subcutaneous Q24H  . fentaNYL  25 mcg Transdermal Q72H  . insulin aspart  0-9 Units Subcutaneous TID WC  . levothyroxine  150 mcg Oral QAC breakfast  . magnesium oxide  800 mg Oral BID  . mouth rinse  15 mL Mouth Rinse BID  . multivitamin with minerals  1 tablet Oral Daily  . nystatin  5 mL Oral QID  . polyvinyl alcohol  1 drop Both Eyes QHS  . potassium chloride  20 mEq Oral BID  . pregabalin  50 mg Oral BID  . protein supplement shake  11 oz Oral TID BM  . psyllium  1 packet Oral BID  . sodium chloride flush  10-40 mL Intracatheter Q12H  . sodium chloride flush  3 mL Intravenous Q12H  . sodium chloride flush  5 mL Intracatheter Q8H  . Tbo-Filgrastim  480 mcg Subcutaneous 6 days   Background: 62 y.o. 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 Fluid collection w/ CT guided drain placed on 09-16-17, removed 2/2 malpositioning. Cultures + last adm for  GBS, lactobacillus, serratia and yeast on cipro PTA. Adm 10/03/17 from SNF with fevers, AMS. IN ED L nephrostomy fell out w/purulent urine from site. We were asked to see for recurrent AKI on CKD creatinine up to 3.7  (most recent creatinine PTA new baseline of around 2; baseline prior to 08/2017 admit was around 02 Jun 2017).   Assessment/Recommendations  1. AKI on CKD - most recent (new) baseline creatinine around 2 prior to current admission. Last AKI was early 08/2017 w/creatinine at that time up to 4.28, had bilat perc tubes placed to divert urine from bladder (colovesical fistula), + vol depletion.  1. Current AKI max creatinine 3.74 some vol depletion, could not exclude obstruction (L perc replaced after coming out, w/purulent urine from exit site, and R perc tube exchanged 10/02/17) and infection.  2. Creatinine improving daily - now best has been in weeks! 2. Volume depletion - replete now 1. Po intake improving. Still not quite keeping up but pushing it. 2. Will decrease IVF and see if can keep up 3. Hypokalemia - K runs X 4 for 4 days in a row last 3/7 1. Have added KCL elixir (Intolerant of K tablets)  2. No extra IV repletion needed today keep 20 mEq BID elixir as is for today 4. Hypomagnesemia - s/p 3 days Mg infusion, last Mg 1.6 1. Added oral yesterday 2. Recheck in AM.  5. Metabolic acidosis- Corrected. Stopped bicarb drip. Has not required po suppl  6. Urosepsis - all UC + MRSA. ID recommending dapto X 2 weeks 7. Chronic obstructive uropathy/colovesical fistula - L PCN replaced, R exchanged 3/3; ultimate need for surgical diversion down the road  8. Anemia - with prior Fe def (unable to give IV Fe for tsat of 7 last adm due to infection; similar  issues now). Transfuse prn.  9. Sacral decubitus - hydrotherapy  10. Chronic neutropenia - on chronic filagrastin   11. Encephalopathy improved with treatment of infection, restoration of volume 12. MRSA PCR +  [name], MD [facility] [phone] Pager 10/07/2017, 10:22 AM

## 2017-10-07 NOTE — Progress Notes (Addendum)
Physical Therapy Wound Treatment Patient Details  Name: [name] MRN: [id number] Date of Birth: 1955/12/07  Today's Date: 10/07/2017 Time: 7989-2119 Time Calculation (min): 42 min  Subjective  Subjective: I was glad to be able to sit up and look out the window today Patient and Family Stated Goals: to heal   Pain Score: Pt premedicated prior to treatment. Pt with 10/10 pain during hydrotherapy and selective debridement   Wound Assessment  Pressure Injury 10/02/17 Unstageable - Full thickness tissue loss in which the base of the ulcer is covered by slough (yellow, tan, gray, green or brown) and/or eschar (tan, brown or black) in the wound bed. Sacrum 9 x12  open with yellow wound base puru (Active)  Wound Image   10/05/2017  6:00 PM  Dressing Type ABD;Barrier Film (skin prep);Moist to dry;Other (Comment) 10/07/2017  3:00 PM  Dressing Clean;Dry;Intact 10/07/2017  3:00 PM  Dressing Change Frequency Daily 10/07/2017  3:00 PM  State of Healing Early/partial granulation 10/07/2017  3:00 PM  Site / Wound Assessment Pink;Yellow;Red;Painful 10/07/2017  3:00 PM  % Wound base Red or Granulating 65% 10/07/2017  3:00 PM  % Wound base Yellow/Fibrinous Exudate 35% 10/07/2017  3:00 PM  Peri-wound Assessment Erythema (blanchable);Maceration 10/07/2017  3:00 PM  Wound Length (cm) 9 cm 10/04/2017 12:00 PM  Wound Width (cm) 12 cm 10/04/2017 12:00 PM  Wound Surface Area (cm^2) 108 cm^2 10/04/2017 12:00 PM  Margins Unattached edges (unapproximated) 10/07/2017  3:00 PM  Drainage Amount Minimal 10/07/2017  3:00 PM  Drainage Description Purulent;Serosanguineous 10/07/2017  3:00 PM  Treatment Cleansed;Debridement (Selective);Hydrotherapy (Pulse lavage);Packing (Saline gauze) 10/07/2017  3:00 PM   Santyl applied to wound base   Hydrotherapy Pulsed lavage therapy - wound location: sacrum Pulsed Lavage with Suction (psi): 4 psi(4-8 psi due to pain) Pulsed Lavage with Suction - Normal Saline Used: 1000 mL Pulsed Lavage Tip: Tip with  splash shield Selective Debridement Selective Debridement - Location: sacrum Selective Debridement - Tools Used: Forceps;Scissors;Scalpel Selective Debridement - Tissue Removed: necrotic, yellow slough   Wound Assessment and Plan  Wound Therapy - Assess/Plan/Recommendations Wound Therapy - Clinical Statement: Central wound area slough continues to be closely adhered however small amount was able to be removed with scapel.  Wound Therapy - Functional Problem List: Decreased sitting tolerance Factors Delaying/Impairing Wound Healing: Diabetes Mellitus;Immobility Hydrotherapy Plan: Debridement;Dressing change;Patient/family education;Pulsatile lavage with suction Wound Therapy - Frequency: 6X / week Wound Therapy - Follow Up Recommendations: Skilled nursing facility Wound Plan: see above  Wound Therapy Goals- Improve the function of patient's integumentary system by progressing the wound(s) through the phases of wound healing (inflammation - proliferation - remodeling) by: Decrease Necrotic Tissue to: 10 Decrease Necrotic Tissue - Progress: Progressing toward goal Increase Granulation Tissue to: 90 Increase Granulation Tissue - Progress: Progressing toward goal Goals/treatment plan/discharge plan were made with and agreed upon by patient/family: Yes Time For Goal Achievement: 7 days Wound Therapy - Potential for Goals: Good  Goals will be updated until maximal potential achieved or discharge criteria met.  Discharge criteria: when goals achieved, discharge from hospital, MD decision/surgical intervention, no progress towards goals, refusal/missing three consecutive treatments without notification or medical reason.  GP    Dani Gobble. Migdalia Dk PT, DPT Acute Rehabilitation  [phone] Pager [phone]   [place] 10/07/2017, 4:41 PM

## 2017-10-08 LAB — GLUCOSE, CAPILLARY
GLUCOSE-CAPILLARY: 93 mg/dL (ref 65–99)
Glucose-Capillary: 112 mg/dL — ABNORMAL HIGH (ref 65–99)
Glucose-Capillary: 135 mg/dL — ABNORMAL HIGH (ref 65–99)
Glucose-Capillary: 121 mg/dL — ABNORMAL HIGH (ref 65–99)

## 2017-10-08 LAB — RENAL FUNCTION PANEL
Albumin: 2 g/dL — ABNORMAL LOW (ref 3.5–5.0)
Anion gap: 13 (ref 5–15)
BUN: 26 mg/dL — ABNORMAL HIGH (ref 6–20)
CHLORIDE: 101 mmol/L (ref 101–111)
CO2: 24 mmol/L (ref 22–32)
Calcium: 8.2 mg/dL — ABNORMAL LOW (ref 8.9–10.3)
Creatinine, Ser: 1.3 mg/dL — ABNORMAL HIGH (ref 0.44–1.00)
GFR calc non Af Amer: 43 mL/min — ABNORMAL LOW (ref >60)
GFR, EST AFRICAN AMERICAN: 50 mL/min — AB (ref >60)
Glucose, Bld: 104 mg/dL — ABNORMAL HIGH (ref 65–99)
POTASSIUM: 4.6 mmol/L (ref 3.5–5.1)
Phosphorus: 4 mg/dL (ref 2.5–4.6)
Sodium: 138 mmol/L (ref 135–145)

## 2017-10-08 LAB — CBC
HCT: 31.6 % — ABNORMAL LOW (ref 36.0–46.0)
Hemoglobin: 9.7 g/dL — ABNORMAL LOW (ref 12.0–15.0)
MCH: 29 pg (ref 26.0–34.0)
MCHC: 30.7 g/dL (ref 30.0–36.0)
MCV: 94.6 fL (ref 78.0–100.0)
Platelets: 270 10*3/uL (ref 150–400)
RBC: 3.34 MIL/uL — AB (ref 3.87–5.11)
RDW: 16.1 % — ABNORMAL HIGH (ref 11.5–15.5)
WBC: 10.6 10*3/uL — ABNORMAL HIGH (ref 4.0–10.5)

## 2017-10-08 LAB — MAGNESIUM: Magnesium: 1.3 mg/dL — ABNORMAL LOW (ref 1.7–2.4)

## 2017-10-08 MED ORDER — PSYLLIUM 95 % PO PACK
1.0000 | PACK | Freq: Every day | ORAL | Status: DC
  Administered 2017-10-08 – 2017-10-13 (×6): 1 via ORAL
  Filled 2017-10-08 (×6): qty 1

## 2017-10-08 MED ORDER — POTASSIUM CHLORIDE 20 MEQ/15ML (10%) PO SOLN
20.0000 meq | Freq: Every day | ORAL | Status: DC
  Administered 2017-10-09 – 2017-10-13 (×3): 20 meq via ORAL
  Filled 2017-10-08 (×5): qty 15

## 2017-10-08 MED ORDER — MAGNESIUM SULFATE 4 GM/100ML IV SOLN
4.0000 g | Freq: Once | INTRAVENOUS | Status: AC
  Administered 2017-10-08: 4 g via INTRAVENOUS
  Filled 2017-10-08: qty 100

## 2017-10-08 MED ORDER — ANASTROZOLE 1 MG PO TABS
1.0000 mg | ORAL_TABLET | Freq: Every day | ORAL | Status: DC
  Administered 2017-10-08 – 2017-10-13 (×6): 1 mg via ORAL
  Filled 2017-10-08 (×6): qty 1

## 2017-10-08 MED ORDER — DIPHENOXYLATE-ATROPINE 2.5-0.025 MG PO TABS
1.0000 | ORAL_TABLET | Freq: Four times a day (QID) | ORAL | Status: DC | PRN
  Administered 2017-10-09 – 2017-10-13 (×5): 1 via ORAL
  Filled 2017-10-08 (×6): qty 1

## 2017-10-08 NOTE — Progress Notes (Signed)
PROGRESS NOTE    [name]  WPY:[id number] DOB: 1956-06-06 DOA: 10/02/2017 PCP: Ann Held, DO   Brief Narrative:  62 year old female with history of endometrial cancer, ovarian cancer, diabetes, hypothyroidism, hypertension, chronic kidney disease who presented with worsening delirium and renal function.  Found to have ureteral stent infection with MRSA, placed on daptomycin.  Infectious disease consulted and appreciated, recommending discharge with Jeani Hawking nasal lid twice daily for 2 weeks.  Nephrology also consulted and following.  Neurology consulted and feels patient would need urinary diversion procedure however will be sometime in the future.  Currently pending SNF placement.  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 -Patient had replacement of left PCN-was noted to have purulent urine from exit site. -Right percutaneous tube was exchanged on 10/02/2017 -Creatinine currently 1.30 -Nephrology consulted and appreciated  Sepsis secondary to MRSA UTI/Stent infection -patient was tachycardic with tachypnea on the day of admission -Patient also found to have neutropenia however that is chronic, currently WBCs are up to 36 -UA showed many bacteria, TNTC WBC, large leukocytes -Blood cultures no growth to date -Urine culture shows MRSA -as noted above, with replacement of left PCN, purulent urine was noted -Infectious disease consulted and appreciated, currently on daptomycin.  Recommended use of linezolid for extended 2 weeks at discharge  Hypomagnesemia -magnesium 1.3 this morning -replacing, continue to monitor  Hypokalemia -resolved, continue to monitor    Metabolic acidosis -Please secondary to AKI, patient did require bicarbonate drip which was discontinued  Anemia secondary to chronic disease/iron deficiency -Hemoglobin dropped to 7.3, received transfusion -Hemglobin currently 9.7  -Continue to monitor CBC  Sacral decubitus ulcer -PT currently advising hydrotherapy -discussed with PT, continue hydrotherapy today.  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  Patient did have this done at home with home health in the past.  Hypothyroidism -Continue Synthroid  Diabetes mellitus, type II -Continue insulin sliding scale, CBG monitoring-CBGs have been stable -lantus currently held due to poor oral intake -have placed patient on regular diet (have told husband and RN that we will liberalize her diet to aid in her nutritional status   Protein calorie malnutrition, moderate -Patient consulted, continue supplements -Patient has had better intake over the past couple of days -see discussion above  Goals of care/pain control -palliative care consulted and appreciated, continues to be full code  DVT Prophylaxis  lovenox  Code Status: Full  Family Communication: Husband at bedside  Disposition Plan: Admitted, continue to monitor in stepdown. Pending SNF when medically stable.    Consultants Infectious disease Nephrology Interventional radiology Urology Palliative care  Procedures  Left PCN replacement and exchange of right PCN interventional radiology  Antibiotics   Anti-infectives (  From admission, onward)   Start     Dose/Rate Route Frequency Ordered Stop   10/06/17 1600  DAPTOmycin (CUBICIN) 500 mg in sodium chloride 0.9 % IVPB     500 mg 220 mL/hr over 30 Minutes Intravenous Every 24 hours 10/06/17 1041     10/04/17 1200  vancomycin (VANCOCIN) IVPB 1000 mg/200 mL premix  Status:  Discontinued     1,000 mg 200 mL/hr over 60 Minutes Intravenous Every 48 hours 10/02/17 0903 10/02/17 1539   10/04/17 0800  levofloxacin (LEVAQUIN) IVPB 500 mg  Status:  Discontinued     500 mg 100 mL/hr over 60 Minutes Intravenous Every 48 hours 10/02/17 0903 10/02/17 1539   10/03/17 1600  DAPTOmycin (CUBICIN) 500 mg in sodium chloride 0.9 % IVPB  Status:  Discontinued     500 mg 220 mL/hr over 30 Minutes Intravenous Every 48 hours 10/02/17 1603 10/06/17 1041   10/02/17 2000  aztreonam (AZACTAM) 500 mg in dextrose 5 % 50 mL IVPB  Status:  Discontinued     500 mg 100 mL/hr over 30 Minutes Intravenous Every 8 hours 10/02/17 0903 10/02/17 1539   10/02/17 2000  meropenem (MERREM) 500 mg in sodium chloride 0.9 % 100 mL IVPB  Status:  Discontinued     500 mg 200 mL/hr over 30 Minutes Intravenous Every 12 hours 10/02/17 1603 10/04/17 0906   10/02/17 0915  aztreonam (AZACTAM) 2 g in sodium chloride 0.9 % 100 mL IVPB     2 g 200 mL/hr over 30 Minutes Intravenous  Once 10/02/17 0902 10/02/17 1007   10/02/17 0915  vancomycin (VANCOCIN) 1,500 mg in sodium chloride 0.9 % 500 mL IVPB     1,500 mg 250 mL/hr over 120 Minutes Intravenous  Once 10/02/17 0902 10/02/17 1243   10/02/17 0730  levofloxacin (LEVAQUIN) IVPB 750 mg     750 mg 100 mL/hr over 90 Minutes Intravenous  Once 10/02/17 0723 10/02/17 0915   10/02/17 0730  aztreonam (AZACTAM) 2 g in sodium chloride 0.9 % 100 mL IVPB   Status:  Discontinued     2 g 200 mL/hr over 30 Minutes Intravenous  Once 10/02/17 0723 10/02/17 0902   10/02/17 0730  vancomycin (VANCOCIN) IVPB 1000 mg/200 mL premix  Status:  Discontinued     1,000 mg 200 mL/hr over 60 Minutes Intravenous  Once 10/02/17 0723 10/02/17 0902      Subjective:   [name] seen and examined today.  Patient with no complaints this morning.  States she has been able to eat better.  Patient does not like taking Metamucil at night would rather take it during the day.  Continues to have high output and was taking Lomotil at home.  Currently she denies chest pain, shortness of breath, abdominal pain, nausea or vomiting, dizziness or headache.   Objective:   Vitals:   10/08/17 0347 10/08/17 0400 10/08/17 0625 10/08/17 0750  BP: (!) 113/54   137/65  Pulse: 70   65  Resp: 14 14  18   Temp: 98.2 F (36.8 C)   98.1 F (36.7 C)  TempSrc: Oral   Oral  SpO2: 98%   98%  Weight:   89.8 kg (198 lb)   Height:        Intake/Output Summary (Last 24 hours) at 10/08/2017 1020 Last data filed at 10/08/2017 0900 Gross per 24 hour  Intake 4153.33 ml  Output 4200 ml  Net -46.67 ml   Filed Weights   10/06/17 0349 10/07/17 0541 10/08/17 2706  Weight: 88.5 kg (195 lb) 88.9 kg (196 lb) 89.8 kg (198 lb)   Exam  General: Well developed, well nourished, NAD, appears stated age  HEENT: NCAT, mucous membranes moist.   Neck: Supple  Cardiovascular: S1 S2 auscultated, RRR, 1/6SEM  Respiratory: Clear to auscultation bilaterally with equal chest rise  Abdomen: Soft, nontender, nondistended, + bowel sounds  Extremities: warm dry without cyanosis clubbing or edema  Neuro: AAOx3, nonfocal  Psych: appropriate mood and affect pleasant  Data Reviewed: I have personally reviewed following labs and imaging studies  CBC: Recent Labs  Lab 10/02/17 0729  10/04/17 0220 10/05/17 0500 10/06/17 0240 10/07/17 0500 10/08/17 0500  WBC 2.9*   < > 3.2* 36.0* 21.7* 16.4* 10.6*   NEUTROABS 1.5*  --   --   --   --   --   --   HGB 9.9*   < > 7.3* 9.1* 8.8* 9.4* 9.7*  HCT 32.2*   < > 23.6* 29.7* 28.3* 31.3* 31.6*  MCV 94.7   < > 90.1 93.1 92.2 93.4 94.6  PLT 387   < > 286 359 274 280 270   < > = values in this interval not displayed.   Basic Metabolic Panel: Recent Labs  Lab 10/02/17 2221  10/04/17 0220 10/04/17 1041 10/05/17 0500 10/05/17 0533 10/06/17 0240 10/07/17 0500 10/08/17 0500  NA  --    < > 145  --   --  147* 137 138 138  K  --    < > 2.4*  --   --  2.8* 3.2* 4.1 4.6  CL  --    < > 106  --   --  104 100* 103 101  CO2  --    < > 27  --   --  29 28 27 24   GLUCOSE  --    < > 83  --   --  126* 111* 110* 104*  BUN  --    < > 49*  --   --  38* 35* 29* 26*  CREATININE  --    < > 2.78*  --   --  2.11* 1.71* 1.38* 1.30*  CALCIUM  --    < > 8.1*  --   --  8.1* 7.9* 8.1* 8.2*  MG 1.1*  --   --  1.7 1.5*  --  1.6*  --  1.3*  PHOS  --    < > 3.6  --   --  2.1* 2.8 3.1 4.0   < > = values in this interval not displayed.   GFR: Estimated Creatinine Clearance: 46.2 mL/min (A) (by C-G formula based on SCr of 1.3 mg/dL (H)). Liver Function Tests: Recent Labs  Lab 10/02/17 0729  10/04/17 0220 10/05/17 0533 10/06/17 0240 10/07/17 0500 10/08/17 0500  AST 15  --  18  --   --   --   --   ALT 20  --  19  --   --   --   --   ALKPHOS 248*  --  226*  --   --   --   --   BILITOT 0.4  --  0.7  --   --   --   --   PROT 7.7  --  5.6*  --   --   --   --   ALBUMIN 1.9*   < > 1.5* 1.6* 1.7* 1.9* 2.0*   < > = values in this interval not displayed.  No results for input(s): LIPASE, AMYLASE in the last 168 hours. No results for input(s): AMMONIA in the last 168 hours. Coagulation Profile: Recent Labs  Lab 10/02/17 2221  INR 1.34   Cardiac Enzymes: Recent Labs  Lab 10/02/17 2221 10/03/17 0253  CKTOTAL 16* 18*   BNP (last 3 results) No results for input(s): PROBNP in the last 8760 hours. HbA1C: No results for input(s): HGBA1C in the last 72  hours. CBG: Recent Labs  Lab 10/07/17 0756 10/07/17 1221 10/07/17 1659 10/07/17 2135 10/08/17 0816  GLUCAP 102* 154* 98 127* 93   Lipid Profile: No results for input(s): CHOL, HDL, LDLCALC, TRIG, CHOLHDL, LDLDIRECT in the last 72 hours. Thyroid Function Tests: No results for input(s): TSH, T4TOTAL, FREET4, T3FREE, THYROIDAB in the last 72 hours. Anemia Panel: No results for input(s): VITAMINB12, FOLATE, FERRITIN, TIBC, IRON, RETICCTPCT in the last 72 hours. Urine analysis:    Component Value Date/Time   COLORURINE YELLOW 10/02/2017 0836   APPEARANCEUR CLOUDY (A) 10/02/2017 0836   LABSPEC 1.025 10/02/2017 0836   LABSPEC 1.015 11/17/2015 1436   PHURINE 5.5 10/02/2017 0836   GLUCOSEU NEGATIVE 10/02/2017 0836   GLUCOSEU 2,000 11/17/2015 1436   HGBUR MODERATE (A) 10/02/2017 0836   BILIRUBINUR NEGATIVE 10/02/2017 0836   BILIRUBINUR neg 08/30/2016 1025   BILIRUBINUR Negative 11/17/2015 1436   KETONESUR 15 (A) 10/02/2017 0836   PROTEINUR >300 (A) 10/02/2017 0836   UROBILINOGEN negative 08/30/2016 1025   UROBILINOGEN 0.2 11/17/2015 1436   NITRITE NEGATIVE 10/02/2017 0836   LEUKOCYTESUR LARGE (A) 10/02/2017 0836   LEUKOCYTESUR Moderate 11/17/2015 1436   Sepsis Labs: @LABRCNTIP (procalcitonin:4,lacticidven:4)  ) Recent Results (from the past 240 hour(s))  Blood culture (routine x 2)     Status: None   Collection Time: 10/02/17  7:00 AM  Result Value Ref Range Status   Specimen Description BLOOD LEFT ANTECUBITAL  Final   Special Requests IN PEDIATRIC BOTTLE Blood Culture adequate volume  Final   Culture   Final    NO GROWTH 5 DAYS Performed at [facility], [place] [address], [place], [place] [address]    Report Status 10/07/2017 FINAL  Final  Blood culture (routine x 2)     Status: None   Collection Time: 10/02/17  7:25 AM  Result Value Ref Range Status   Specimen Description BLOOD LEFT HAND  Final   Special Requests IN PEDIATRIC BOTTLE Blood Culture adequate volume   Final   Culture   Final    NO GROWTH 5 DAYS Performed at [facility], [place] [address] [place], [place] [address]    Report Status 10/07/2017 FINAL  Final  Urine culture     Status: Abnormal   Collection Time: 10/02/17  8:39 AM  Result Value Ref Range Status   Specimen Description URINE, RANDOM  Final   Special Requests   Final    NONE Performed at [facility], [place] [address] [place], [place] [address]    Culture MULTIPLE SPECIES PRESENT, SUGGEST RECOLLECTION (A)  Final   Report Status 10/04/2017 FINAL  Final  Urine culture     Status: Abnormal   Collection Time: 10/02/17  2:20 PM  Result Value Ref Range Status   Specimen Description URINE, CATHETERIZED  Final   Special Requests   Final    NONE Performed at [facility], [place] [address] [place], [place] [address]    Culture (A)  Final    20,000 COLONIES/mL METHICILLIN RESISTANT STAPHYLOCOCCUS AUREUS  Report Status 10/04/2017 FINAL  Final   Organism ID, Bacteria METHICILLIN RESISTANT STAPHYLOCOCCUS AUREUS (A)  Final      Susceptibility   Methicillin resistant staphylococcus aureus - MIC*    CIPROFLOXACIN >=8 RESISTANT Resistant     GENTAMICIN <=0.5 SENSITIVE Sensitive     NITROFURANTOIN <=16 SENSITIVE Sensitive     OXACILLIN >=4 RESISTANT Resistant     TETRACYCLINE <=1 SENSITIVE Sensitive     VANCOMYCIN <=0.5 SENSITIVE Sensitive     TRIMETH/SULFA >=320 RESISTANT Resistant     CLINDAMYCIN <=0.25 SENSITIVE Sensitive     RIFAMPIN <=0.5 SENSITIVE Sensitive     Inducible Clindamycin NEGATIVE Sensitive     * 20,000 COLONIES/mL METHICILLIN RESISTANT STAPHYLOCOCCUS AUREUS  Urine Culture     Status: Abnormal   Collection Time: 10/02/17  6:31 PM  Result Value Ref Range Status   Specimen Description KIDNEY RIGHT  Final   Special Requests   Final    NONE Performed at [facility], [place] [address], [place], [place] [address]    Culture (A)  Final    50,000 COLONIES/mL METHICILLIN RESISTANT STAPHYLOCOCCUS  AUREUS   Report Status 10/04/2017 FINAL  Final   Organism ID, Bacteria METHICILLIN RESISTANT STAPHYLOCOCCUS AUREUS (A)  Final      Susceptibility   Methicillin resistant staphylococcus aureus - MIC*    CIPROFLOXACIN >=8 RESISTANT Resistant     ERYTHROMYCIN >=8 RESISTANT Resistant     GENTAMICIN <=0.5 SENSITIVE Sensitive     OXACILLIN >=4 RESISTANT Resistant     TETRACYCLINE <=1 SENSITIVE Sensitive     VANCOMYCIN 1 SENSITIVE Sensitive     TRIMETH/SULFA >=320 RESISTANT Resistant     CLINDAMYCIN <=0.25 SENSITIVE Sensitive     RIFAMPIN <=0.5 SENSITIVE Sensitive     Inducible Clindamycin NEGATIVE Sensitive     * 50,000 COLONIES/mL METHICILLIN RESISTANT STAPHYLOCOCCUS AUREUS  MRSA PCR Screening     Status: Abnormal   Collection Time: 10/02/17  6:54 PM  Result Value Ref Range Status   MRSA by PCR POSITIVE (A) NEGATIVE Final    Comment:        The GeneXpert MRSA Assay (FDA approved for NASAL specimens only), is one component of a comprehensive MRSA colonization surveillance program. It is not intended to diagnose MRSA infection nor to guide or monitor treatment for MRSA infections. RESULT CALLED TO, READ BACK BY AND VERIFIED WITH: TDECAREAUX,RN @2050  10/02/17 BY LHOWARD Performed at [facility], [place] [address], [place], [place] [address]   Urine culture     Status: Abnormal   Collection Time: 10/02/17  7:35 PM  Result Value Ref Range Status   Specimen Description KIDNEY RIGHT  Final   Special Requests   Final    Normal Performed at [facility], [place] [address] [place], [place] [address]    Culture (A)  Final    >=100,000 COLONIES/mL METHICILLIN RESISTANT STAPHYLOCOCCUS AUREUS   Report Status 10/04/2017 FINAL  Final   Organism ID, Bacteria METHICILLIN RESISTANT STAPHYLOCOCCUS AUREUS (A)  Final      Susceptibility   Methicillin resistant staphylococcus aureus - MIC*    CIPROFLOXACIN >=8 RESISTANT Resistant     ERYTHROMYCIN >=8 RESISTANT Resistant     GENTAMICIN  <=0.5 SENSITIVE Sensitive     OXACILLIN >=4 RESISTANT Resistant     TETRACYCLINE <=1 SENSITIVE Sensitive     VANCOMYCIN 1 SENSITIVE Sensitive     TRIMETH/SULFA >=320 RESISTANT Resistant     CLINDAMYCIN <=0.25 SENSITIVE Sensitive  RIFAMPIN <=0.5 SENSITIVE Sensitive     Inducible Clindamycin NEGATIVE Sensitive     * >=100,000 COLONIES/mL METHICILLIN RESISTANT STAPHYLOCOCCUS AUREUS      Radiology Studies: No results found.   Scheduled Meds: . anastrozole  1 mg Oral Daily  . artificial tears  1 application Both Eyes QHS  . collagenase   Topical Daily  . enoxaparin (LOVENOX) injection  40 mg Subcutaneous Q24H  . fentaNYL  25 mcg Transdermal Q72H  . insulin aspart  0-9 Units Subcutaneous TID WC  . levothyroxine  150 mcg Oral QAC breakfast  . magnesium oxide  800 mg Oral BID  . mouth rinse  15 mL Mouth Rinse BID  . multivitamin with minerals  1 tablet Oral Daily  . nystatin  5 mL Oral QID  . polyvinyl alcohol  1 drop Both Eyes QHS  . [START ON 10/09/2017] potassium chloride  20 mEq Oral Daily  . pregabalin  50 mg Oral BID  . protein supplement shake  11 oz Oral TID BM  . psyllium  1 packet Oral Daily  . sodium chloride flush  10-40 mL Intracatheter Q12H  . sodium chloride flush  3 mL Intravenous Q12H  . sodium chloride flush  5 mL Intracatheter Q8H  . Tbo-Filgrastim  480 mcg Subcutaneous 6 days   Continuous Infusions: . DAPTOmycin (CUBICIN)  IV Stopped (10/07/17 1700)  . magnesium sulfate 1 - 4 g bolus IVPB    . sodium chloride 0.9 % 1,000 mL infusion 10 mL/hr at 10/08/17 0500     LOS: 6 days   Time Spent in minutes   30 minutes  Dulcie Gammon D.O. on 10/08/2017 at 10:20 AM  Between 7am to 7pm - Pager - [phone]  After 7pm go to [email or web address] - password [email or web address]  And look for the night coverage person covering for me after hours  [facility] Office  [phone]

## 2017-10-08 NOTE — Progress Notes (Addendum)
Physical Therapy Wound Treatment Patient Details  Name: Trinette Vera MRN: [id number] Date of Birth: 02-Jul-1956  Today's Date: 10/08/2017 Time: 1012-1049 Time Calculation (min): 37 min  Subjective  Subjective: Pt got some bad news regarding a close friend being terminally ill, and was upset during session.  Patient and Family Stated Goals: to heal   Pain Score: Pt with increased anxiety with the anticipation of pain. Overall tolerated well with lower psi and premedication.   Wound Assessment  Pressure Injury 10/02/17 Unstageable - Full thickness tissue loss in which the base of the ulcer is covered by slough (yellow, tan, gray, green or brown) and/or eschar (tan, brown or black) in the wound bed. Sacrum 9 x12  open with yellow wound base puru (Active)  Wound Image   10/05/2017  6:00 PM  Dressing Type ABD;Barrier Film (skin prep);Gauze (Comment);Moist to dry 10/08/2017 11:00 AM  Dressing Changed;Clean;Dry;Intact 10/08/2017 11:00 AM  Dressing Change Frequency Daily 10/08/2017 11:00 AM  State of Healing Early/partial granulation 10/08/2017 11:00 AM  Site / Wound Assessment Pink;Yellow;Painful 10/08/2017 11:00 AM  % Wound base Red or Granulating 65% 10/08/2017 11:00 AM  % Wound base Yellow/Fibrinous Exudate 35% 10/08/2017 11:00 AM  Peri-wound Assessment Erythema (blanchable);Maceration 10/08/2017 11:00 AM  Wound Length (cm) 9 cm 10/04/2017 12:00 PM  Wound Width (cm) 12 cm 10/04/2017 12:00 PM  Wound Surface Area (cm^2) 108 cm^2 10/04/2017 12:00 PM  Margins Unattached edges (unapproximated) 10/08/2017 11:00 AM  Drainage Amount Minimal 10/08/2017 11:00 AM  Drainage Description Purulent;Serosanguineous 10/08/2017 11:00 AM  Treatment Debridement (Selective);Hydrotherapy (Pulse lavage);Packing (Saline gauze) 10/08/2017 11:00 AM   Santyl applied to wound bed prior to applying dressing.    Pressure Injury 10/02/17 Unstageable - Full thickness tissue loss in which the base of the ulcer is covered by slough (yellow, tan, gray, green  or brown) and/or eschar (tan, brown or black) in the wound bed. 1x1 cm  (Active)  Dressing Type ABD;Barrier Film (skin prep);Gauze (Comment);Moist to dry 10/08/2017 11:00 AM  Dressing Changed;Clean;Dry;Intact 10/08/2017 11:00 AM  Dressing Change Frequency Daily 10/08/2017 11:00 AM  State of Healing Non-healing 10/08/2017 11:00 AM  Site / Wound Assessment Pink;Yellow 10/08/2017 11:00 AM  % Wound base Red or Granulating 50% 10/08/2017 11:00 AM  % Wound base Yellow/Fibrinous Exudate 50% 10/08/2017 11:00 AM  Peri-wound Assessment Intact 10/08/2017 11:00 AM  Wound Length (cm) 1 cm 10/02/2017 11:00 PM  Wound Width (cm) 1 cm 10/02/2017 11:00 PM  Wound Depth (cm) 3.5 cm 10/02/2017 11:00 PM  Wound Surface Area (cm^2) 1 cm^2 10/02/2017 11:00 PM  Wound Volume (cm^3) 3.5 cm^3 10/02/2017 11:00 PM  Margins Unattached edges (unapproximated) 10/08/2017 11:00 AM  Drainage Amount Scant 10/08/2017 11:00 AM  Drainage Description Purulent 10/08/2017 11:00 AM  Treatment Debridement (Selective);Hydrotherapy (Pulse lavage);Packing (Saline gauze) 10/08/2017 11:00 AM   Santyl applied to wound bed prior to applying dressing.     Hydrotherapy Pulsed lavage therapy - wound location: Sacrum, hip Pulsed Lavage with Suction (psi): 4 psi(due to pain) Pulsed Lavage with Suction - Normal Saline Used: 1000 mL Pulsed Lavage Tip: Tip with splash shield Selective Debridement Selective Debridement - Location: sacrum, hip Selective Debridement - Tools Used: Scalpel;Forceps Selective Debridement - Tissue Removed: necrotic, yellow slough   Wound Assessment and Plan  Wound Therapy - Assess/Plan/Recommendations Wound Therapy - Clinical Statement: Central wound area slough continues to be closely adhered however small amount was able to be removed with scapel. Cross-hatched slough with scalpel to promote softening.  Wound Therapy - Functional Problem List:  Decreased sitting tolerance Factors Delaying/Impairing Wound Healing: Diabetes  Mellitus;Immobility Hydrotherapy Plan: Debridement;Dressing change;Patient/family education;Pulsatile lavage with suction Wound Therapy - Frequency: 6X / week Wound Therapy - Follow Up Recommendations: Skilled nursing facility Wound Plan: see above  Wound Therapy Goals- Improve the function of patient's integumentary system by progressing the wound(s) through the phases of wound healing (inflammation - proliferation - remodeling) by: Decrease Necrotic Tissue to: 10% Decrease Necrotic Tissue - Progress: Progressing toward goal Increase Granulation Tissue to: 90% Increase Granulation Tissue - Progress: Progressing toward goal Goals/treatment plan/discharge plan were made with and agreed upon by patient/family: Yes Time For Goal Achievement: 7 days Wound Therapy - Potential for Goals: Good  Goals will be updated until maximal potential achieved or discharge criteria met.  Discharge criteria: when goals achieved, discharge from hospital, MD decision/surgical intervention, no progress towards goals, refusal/missing three consecutive treatments without notification or medical reason.  GP     Thelma Comp 10/08/2017, 11:27 AM   Rolinda Roan, PT, DPT Acute Rehabilitation Services Pager: [phone]

## 2017-10-08 NOTE — Progress Notes (Signed)
[facility] Rounding Note  Subjective/Interval History:   Large stool and urine output Stool recorded as 2.25 liters past 24 hours 2.4 liters urine out  Tolerating her po KCL liquid and working hard on po fluids Complains that hands are puffy  Objective Vital signs in last 24 hours: Vitals:   10/08/17 0347 10/08/17 0400 10/08/17 0625 10/08/17 0750  BP: (!) 113/54   137/65  Pulse: 70   65  Resp: 14 14  18   Temp: 98.2 F (36.8 C)   98.1 F (36.7 C)  TempSrc: Oral   Oral  SpO2: 98%   98%  Weight:   89.8 kg (198 lb)   Height:       Weight change: 0.907 kg (2 lb)  Intake/Output Summary (Last 24 hours) at 10/08/2017 0946 Last data filed at 10/08/2017 0900 Gross per 24 hour  Intake 4153.33 ml  Output 4200 ml  Net -46.67 ml   Physical Exam:  Blood pressure 137/65, pulse 65, temperature 98.1 F (36.7 C), temperature source Oral, resp. rate 18, height 5\' 2"  (1.575 m), weight 89.8 kg (198 lb), SpO2 98 %.   Awake and alert L IJ 3L Lungs clear O1Y0 No S3 1/6 systolic murmur no diastolic murmur Ostomy in place liquid drainage Trace edema pitting of LE's PCN sites intact Sacral decub covered   Recent Labs  Lab 10/02/17 0729 10/03/17 0253 10/04/17 0220 10/05/17 0533 10/06/17 0240 10/07/17 0500 10/08/17 0500  NA 136 141 145 147* 137 138 138  K 4.0 3.6 2.4* 2.8* 3.2* 4.1 4.6  CL 109 112* 106 104 100* 103 101  CO2 14* 16* 27 29 28 27 24   GLUCOSE 123* 155* 83 126* 111* 110* 104*  BUN 52* 52* 49* 38* 35* 29* 26*  CREATININE 3.74* 3.27* 2.78* 2.11* 1.71* 1.38* 1.30*  CALCIUM 9.2 8.5* 8.1* 8.1* 7.9* 8.1* 8.2*  PHOS  --  5.3* 3.6 2.1* 2.8 3.1 4.0    Recent Labs  Lab 10/02/17 0729  10/04/17 0220  10/06/17 0240 10/07/17 0500 10/08/17 0500  AST 15  --  18  --   --   --   --   ALT 20  --  19  --   --   --   --   ALKPHOS 248*  --  226*  --   --   --   --   BILITOT 0.4  --  0.7  --   --   --   --   PROT 7.7  --  5.6*  --   --   --   --   ALBUMIN 1.9*   < > 1.5*   < > 1.7* 1.9*  2.0*   < > = values in this interval not displayed.    Recent Labs  Lab 10/02/17 0729  10/05/17 0500 10/06/17 0240 10/07/17 0500 10/08/17 0500  WBC 2.9*   < > 36.0* 21.7* 16.4* 10.6*  NEUTROABS 1.5*  --   --   --   --   --   HGB 9.9*   < > 9.1* 8.8* 9.4* 9.7*  HCT 32.2*   < > 29.7* 28.3* 31.3* 31.6*  MCV 94.7   < > 93.1 92.2 93.4 94.6  PLT 387   < > 359 274 280 270   < > = values in this interval not displayed.    Recent Labs  Lab 10/02/17 2221 10/03/17 0253  CKTOTAL 16* 18*   Recent Labs  Lab 10/07/17 0756 10/07/17 1221 10/07/17 1659  10/07/17 2135 10/08/17 0816  GLUCAP 102* 154* 98 127* 93   Results for [name], [name] (MRN [id number]) as of 10/08/2017 10:05  10/02/2017 22:21 10/04/2017 10:41 10/05/2017 05:00 10/06/2017 02:40 10/08/2017 05:00  Magnesium 1.1 (L) 1.7 1.5 (L) 1.6 (L) 1.3 (L)    Urine cultures all + MRSA Blood cultures negative to date  Medications: . DAPTOmycin (CUBICIN)  IV Stopped (10/07/17 1700)  . sodium chloride 0.9 % 1,000 mL infusion 10 mL/hr at 10/08/17 0500   . anastrozole  1 mg Oral Daily  . artificial tears  1 application Both Eyes QHS  . collagenase   Topical Daily  . enoxaparin (LOVENOX) injection  40 mg Subcutaneous Q24H  . fentaNYL  25 mcg Transdermal Q72H  . insulin aspart  0-9 Units Subcutaneous TID WC  . levothyroxine  150 mcg Oral QAC breakfast  . magnesium oxide  800 mg Oral BID  . mouth rinse  15 mL Mouth Rinse BID  . multivitamin with minerals  1 tablet Oral Daily  . nystatin  5 mL Oral QID  . polyvinyl alcohol  1 drop Both Eyes QHS  . potassium chloride  20 mEq Oral BID  . pregabalin  50 mg Oral BID  . protein supplement shake  11 oz Oral TID BM  . psyllium  1 packet Oral Daily  . sodium chloride flush  10-40 mL Intracatheter Q12H  . sodium chloride flush  3 mL Intravenous Q12H  . sodium chloride flush  5 mL Intracatheter Q8H  . Tbo-Filgrastim  480 mcg Subcutaneous 6 days   Background: 62 y.o. 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 Fluid collection w/ CT guided drain placed on 09-16-17, removed 2/2 malpositioning. Cultures + last adm for  GBS, lactobacillus, serratia and yeast on cipro PTA. Adm 10/03/17 from SNF with fevers, AMS. In ED L nephrostomy fell out w/purulent urine from site. Replaced and R tube exchanged. We were asked to see for recurrent AKI on CKD creatinine up to 3.7  (most recent creatinine PTA new baseline of around 2; baseline prior to 08/2017 admit was around 02 Jun 2017. Nadir this admission improved to 1.3   Assessment/Recommendations  1. AKI on CKD - most recent (new) baseline creatinine around 2 prior to current admission. Last AKI was early 08/2017 w/creatinine at that time up to 4.28, had bilat perc tubes placed to divert urine from bladder (colovesical fistula), + vol depletion.  1. Current AKI max creatinine 3.74 2/2 vol depletion, displaced RL perc tube (replaced) and sepsis 2. Creatinine improved/stable past 2 days at 1.3 (best in a couple of months!) 2. Volume depletion - no IVF right now, relying on just po intake. Has HUGE stool volumes (now that eating and drinking) and I still have concerns about recurrent volume depletion but so far so good 3. Hypokalemia - Had K runs X 4 for 4 days in a row last 3/7. 1. Tolerates KCL elixir on 20 BID K trending up so reduce dose to 20 daily  4. Hypomagnesemia - s/p 3 days Mg infusion. Mg back down to 1.3 despite oral. Redose IV.   5. Metabolic acidosis- Corrected. Stopped bicarb drip. Has not required po suppl  6. Urosepsis - all UC + MRSA. ID recommending dapto X 2 weeks 7. Chronic obstructive uropathy/colovesical fistula - L PCN replaced,  R exchanged 3/3; ultimate need for surgical diversion down the road  8. Anemia  - with prior Fe def (unable to give IV Fe for tsat of 7 last adm due to infection; similar issues now). Transfuse prn.  9. Sacral decubitus - hydrotherapy  10. Chronic neutropenia - on chronic filagrastin   11. Encephalopathy improved with treatment of infection, restoration of volume 12. MRSA PCR +  Disposition - I still have concerns about re-development of volume depletion as she tries to keep up with large ostomy losses. May be necessary to arrange for her to get a liter of fluids a couple of times a week (she has done this before at home for this reason).  2nd concern is electrolyte management. K and phos now stable but Mg low again despite po and will need IV Mg today. Agree with continued inpt monitoring.   [name] Maes, MD [facility] [phone] Pager 10/08/2017, 9:46 AM

## 2017-10-09 LAB — RENAL FUNCTION PANEL
ALBUMIN: 2.2 g/dL — AB (ref 3.5–5.0)
ANION GAP: 11 (ref 5–15)
BUN: 27 mg/dL — AB (ref 6–20)
CO2: 25 mmol/L (ref 22–32)
Calcium: 8.5 mg/dL — ABNORMAL LOW (ref 8.9–10.3)
Chloride: 101 mmol/L (ref 101–111)
Creatinine, Ser: 1.44 mg/dL — ABNORMAL HIGH (ref 0.44–1.00)
GFR calc Af Amer: 44 mL/min — ABNORMAL LOW (ref >60)
GFR calc non Af Amer: 38 mL/min — ABNORMAL LOW (ref >60)
GLUCOSE: 95 mg/dL (ref 65–99)
Phosphorus: 4.1 mg/dL (ref 2.5–4.6)
Potassium: 4.4 mmol/L (ref 3.5–5.1)
SODIUM: 137 mmol/L (ref 135–145)

## 2017-10-09 LAB — CBC
HCT: 32 % — ABNORMAL LOW (ref 36.0–46.0)
Hemoglobin: 9.7 g/dL — ABNORMAL LOW (ref 12.0–15.0)
MCH: 28.9 pg (ref 26.0–34.0)
MCHC: 30.3 g/dL (ref 30.0–36.0)
MCV: 95.2 fL (ref 78.0–100.0)
Platelets: 262 10*3/uL (ref 150–400)
RBC: 3.36 MIL/uL — ABNORMAL LOW (ref 3.87–5.11)
RDW: 16 % — ABNORMAL HIGH (ref 11.5–15.5)
WBC: 6.5 10*3/uL (ref 4.0–10.5)

## 2017-10-09 LAB — GLUCOSE, CAPILLARY
GLUCOSE-CAPILLARY: 166 mg/dL — AB (ref 65–99)
Glucose-Capillary: 107 mg/dL — ABNORMAL HIGH (ref 65–99)
Glucose-Capillary: 122 mg/dL — ABNORMAL HIGH (ref 65–99)
Glucose-Capillary: 118 mg/dL — ABNORMAL HIGH (ref 65–99)

## 2017-10-09 LAB — CREATININE, SERUM
Creatinine, Ser: 1.49 mg/dL — ABNORMAL HIGH (ref 0.44–1.00)
GFR calc Af Amer: 42 mL/min — ABNORMAL LOW (ref >60)
GFR calc non Af Amer: 36 mL/min — ABNORMAL LOW (ref >60)

## 2017-10-09 LAB — MAGNESIUM: Magnesium: 2 mg/dL (ref 1.7–2.4)

## 2017-10-09 MED ORDER — DIPHENOXYLATE-ATROPINE 2.5-0.025 MG PO TABS
1.0000 | ORAL_TABLET | Freq: Every day | ORAL | Status: DC
  Administered 2017-10-09 – 2017-10-13 (×5): 1 via ORAL
  Filled 2017-10-09 (×4): qty 1

## 2017-10-09 NOTE — Progress Notes (Signed)
PROGRESS NOTE    [name]  ERD:[id number] DOB: 01/21/1956 DOA: 10/02/2017 PCP: Ann Held, DO   Brief Narrative:  62 year old female with history of endometrial cancer, ovarian cancer, diabetes, hypothyroidism, hypertension, chronic kidney disease who presented with worsening delirium and renal function.  Found to have ureteral stent infection with MRSA, placed on daptomycin.  Infectious disease consulted and appreciated, recommending discharge with Jeani Hawking nasal lid twice daily for 2 weeks.  Nephrology also consulted and following.  Neurology consulted and feels patient would need urinary diversion procedure however will be sometime in the future.  Currently pending SNF placement.  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 -Patient had replacement of left PCN-was noted to have purulent urine from exit site. -Right percutaneous tube was exchanged on 10/02/2017 -Creatinine currently 1.49 -Nephrology consulted and appreciated  Sepsis secondary to MRSA UTI/Stent infection -patient was tachycardic with tachypnea on the day of admission -Patient also found to have neutropenia however that is chronic, currently WBCs are up to 36 -UA showed many bacteria, TNTC WBC, large leukocytes -Blood cultures no growth to date -Urine culture shows MRSA -as noted above, with replacement of left PCN, purulent urine was noted -Infectious disease consulted and appreciated, currently on daptomycin.  Recommended use of linezolid for extended 2 weeks at discharge  Hypomagnesemia -resolved with replacement -continue to monitor   Hypokalemia -resolved, continue to monitor  BMP  Metabolic acidosis -Please secondary to AKI, patient did require bicarbonate drip which was discontinued  Anemia secondary to chronic disease/iron deficiency -Hemoglobin dropped to 7.3, received transfusion -Hemglobin stable, currently 9.7  -Continue to monitor CBC  Sacral decubitus ulcer -PT currently advising hydrotherapy -discussed with PT, continue hydrotherapy today.  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  Patient did have this done at home with home health in the past.  Hypothyroidism -Continue Synthroid  Diabetes mellitus, type II -Continue insulin sliding scale, CBG monitoring-CBGs have been stable -lantus currently held due to poor oral intake -have placed patient on regular diet (have told husband and RN that we will liberalize her diet to aid in her nutritional status   Protein calorie malnutrition, moderate -Patient consulted, continue supplements -Patient has had better intake over the past few days -see discussion above  Goals of care/pain control -palliative care consulted and appreciated, continues to be full code  DVT Prophylaxis  lovenox  Code Status: Full  Family Communication: none at bedside  Disposition Plan: Admitted, continue to monitor in stepdown.  Pending SNF when medically stable.   Consultants Infectious disease Nephrology Interventional radiology Urology Palliative care  Procedures  Left PCN replacement and exchange of right PCN interventional  radiology  Antibiotics   Anti-infectives (From admission, onward)   Start     Dose/Rate Route Frequency Ordered Stop   10/06/17 1600  DAPTOmycin (CUBICIN) 500 mg in sodium chloride 0.9 % IVPB     500 mg 220 mL/hr over 30 Minutes Intravenous Every 24 hours 10/06/17 1041     10/04/17 1200  vancomycin (VANCOCIN) IVPB 1000 mg/200 mL premix  Status:  Discontinued     1,000 mg 200 mL/hr over 60 Minutes Intravenous Every 48 hours 10/02/17 0903 10/02/17 1539   10/04/17 0800  levofloxacin (LEVAQUIN) IVPB 500 mg  Status:  Discontinued     500 mg 100 mL/hr over 60 Minutes Intravenous Every 48 hours 10/02/17 0903 10/02/17 1539   10/03/17 1600  DAPTOmycin (CUBICIN) 500 mg in sodium chloride 0.9 % IVPB  Status:  Discontinued     500 mg 220 mL/hr over 30 Minutes Intravenous Every 48 hours 10/02/17 1603 10/06/17 1041   10/02/17 2000  aztreonam (AZACTAM) 500 mg in dextrose 5 % 50 mL IVPB  Status:  Discontinued     500 mg 100 mL/hr over 30 Minutes Intravenous Every 8 hours 10/02/17 0903 10/02/17 1539   10/02/17 2000  meropenem (MERREM) 500 mg in sodium chloride 0.9 % 100 mL IVPB  Status:  Discontinued     500 mg 200 mL/hr over 30 Minutes Intravenous Every 12 hours 10/02/17 1603 10/04/17 0906   10/02/17 0915  aztreonam (AZACTAM) 2 g in sodium chloride 0.9 % 100 mL IVPB     2 g 200 mL/hr over 30 Minutes Intravenous  Once 10/02/17 0902 10/02/17 1007   10/02/17 0915  vancomycin (VANCOCIN) 1,500 mg in sodium chloride 0.9 % 500 mL IVPB     1,500 mg 250 mL/hr over 120 Minutes Intravenous  Once 10/02/17 0902 10/02/17 1243   10/02/17 0730  levofloxacin (LEVAQUIN) IVPB 750 mg     750 mg 100 mL/hr over 90 Minutes Intravenous  Once 10/02/17 0723 10/02/17 0915   10/02/17 0730  aztreonam (AZACTAM) 2 g in  sodium chloride 0.9 % 100 mL IVPB  Status:  Discontinued     2 g 200 mL/hr over 30 Minutes Intravenous  Once 10/02/17 0723 10/02/17 0902   10/02/17 0730  vancomycin (VANCOCIN) IVPB 1000 mg/200 mL premix  Status:  Discontinued     1,000 mg 200 mL/hr over 60 Minutes Intravenous  Once 10/02/17 0723 10/02/17 0902      Subjective:   [name] seen and examined today.  Complains of the mattress being uncomfortable and difficult to move in. Denies current pain this morning. Denies chest pain, shortness of breath, abdominal pain, N/V.  Has been able to eat and drink more.  Objective:   Vitals:   10/09/17 0400 10/09/17 0444 10/09/17 0650 10/09/17 0813  BP:  130/70  139/82  Pulse:  71  74  Resp: 16 12  (!) 21  Temp:  98.5 F (36.9 C)  98.5 F (36.9 C)  TempSrc:  Oral  Oral  SpO2:  98%  100%  Weight:   87.9 kg (193 lb 12.6 oz)   Height:        Intake/Output Summary (Last 24 hours) at 10/09/2017 1118 Last data filed at 10/09/2017 0900 Gross per 24 hour  Intake 1580 ml  Output 5675 ml  Net -4095 ml   Filed Weights   10/07/17 0541 10/08/17 0625 10/09/17 0650  Weight: 88.9 kg (196 lb) 89.8 kg (198 lb) 87.9 kg (193 lb 12.6 oz)   Exam  General: Well developed, well nourished, NAD, appears stated age  35: NCAT, mucous membranes moist.   Neck: Supple  Cardiovascular: S1 S2 auscultated, 1/6SEM, RRR  Respiratory: Clear to auscultation bilaterally with equal chest rise  Abdomen: Soft, nontender, nondistended, + bowel sounds  Extremities: warm dry without cyanosis clubbing or edema  Neuro: AAOx3, nonfocal  Psych: Normal affect and demeanor with intact judgement and insight  Data Reviewed: I have personally reviewed following labs and imaging studies  CBC: Recent Labs  Lab 10/05/17 0500 10/06/17 0240 10/07/17 0500 10/08/17 0500 10/09/17 0845  WBC 36.0* 21.7* 16.4* 10.6* 6.5  HGB 9.1* 8.8* 9.4* 9.7* 9.7*  HCT 29.7* 28.3* 31.3* 31.6* 32.0*  MCV 93.1 92.2 93.4 94.6  95.2  PLT 359 274 280 270 798   Basic Metabolic Panel: Recent Labs  Lab 10/04/17 1041 10/05/17 0500 10/05/17 0533 10/06/17 0240 10/07/17 0500 10/08/17 0500 10/09/17 0845  NA  --   --  147* 137 138 138 137  K  --   --  2.8* 3.2* 4.1 4.6 4.4  CL  --   --  104 100* 103 101 101  CO2  --   --  29 28 27 24 25   GLUCOSE  --   --  126* 111* 110* 104* 95  BUN  --   --  38* 35* 29* 26* 27*  CREATININE  --   --  2.11* 1.71* 1.38* 1.30* 1.49*  1.44*  CALCIUM  --   --  8.1* 7.9* 8.1* 8.2* 8.5*  MG 1.7 1.5*  --  1.6*  --  1.3* 2.0  PHOS  --   --  2.1* 2.8 3.1 4.0 4.1   GFR: Estimated Creatinine Clearance: 39.8 mL/min (A) (by C-G formula based on SCr of 1.49 mg/dL (H)). Liver Function Tests: Recent Labs  Lab 10/04/17 0220 10/05/17 0533 10/06/17 0240 10/07/17 0500 10/08/17 0500 10/09/17 0845  AST 18  --   --   --   --   --   ALT 19  --   --   --   --   --   ALKPHOS 226*  --   --   --   --   --   BILITOT 0.7  --   --   --   --   --   PROT 5.6*  --   --   --   --   --   ALBUMIN 1.5* 1.6* 1.7* 1.9* 2.0* 2.2*   No results for input(s): LIPASE, AMYLASE in the last 168 hours. No results for input(s): AMMONIA in the last 168 hours. Coagulation Profile: Recent Labs  Lab 10/02/17 2221  INR 1.34   Cardiac Enzymes: Recent Labs  Lab 10/02/17 2221 10/03/17 0253  CKTOTAL 16* 18*   BNP (last 3 results) No results for input(s): PROBNP in the last 8760 hours. HbA1C: No results for input(s): HGBA1C in the last 72 hours. CBG: Recent Labs  Lab 10/08/17 0816 10/08/17 1223 10/08/17 1607 10/08/17 2134 10/09/17 0806  GLUCAP 93 112* 135* 121* 107*   Lipid Profile: No results for input(s): CHOL, HDL, LDLCALC, TRIG, CHOLHDL, LDLDIRECT in the last 72 hours. Thyroid Function Tests: No results for input(s): TSH, T4TOTAL, FREET4, T3FREE, THYROIDAB in the last 72 hours. Anemia Panel: No results for input(s): VITAMINB12, FOLATE, FERRITIN, TIBC, IRON, RETICCTPCT in the last 72 hours. Urine  analysis:    Component Value Date/Time   COLORURINE YELLOW 10/02/2017 0836   APPEARANCEUR CLOUDY (A) 10/02/2017 0836   LABSPEC  1.025 10/02/2017 0836   LABSPEC 1.015 11/17/2015 1436   PHURINE 5.5 10/02/2017 0836   GLUCOSEU NEGATIVE 10/02/2017 0836   GLUCOSEU 2,000 11/17/2015 1436   HGBUR MODERATE (A) 10/02/2017 0836   BILIRUBINUR NEGATIVE 10/02/2017 0836   BILIRUBINUR neg 08/30/2016 1025   BILIRUBINUR Negative 11/17/2015 1436   KETONESUR 15 (A) 10/02/2017 0836   PROTEINUR >300 (A) 10/02/2017 0836   UROBILINOGEN negative 08/30/2016 1025   UROBILINOGEN 0.2 11/17/2015 1436   NITRITE NEGATIVE 10/02/2017 0836   LEUKOCYTESUR LARGE (A) 10/02/2017 0836   LEUKOCYTESUR Moderate 11/17/2015 1436   Sepsis Labs: @LABRCNTIP (procalcitonin:4,lacticidven:4)  ) Recent Results (from the past 240 hour(s))  Blood culture (routine x 2)     Status: None   Collection Time: 10/02/17  7:00 AM  Result Value Ref Range Status   Specimen Description BLOOD LEFT ANTECUBITAL  Final   Special Requests IN PEDIATRIC BOTTLE Blood Culture adequate volume  Final   Culture   Final    NO GROWTH 5 DAYS Performed at [facility], [place] [address] [place], [place] [address]    Report Status 10/07/2017 FINAL  Final  Blood culture (routine x 2)     Status: None   Collection Time: 10/02/17  7:25 AM  Result Value Ref Range Status   Specimen Description BLOOD LEFT HAND  Final   Special Requests IN PEDIATRIC BOTTLE Blood Culture adequate volume  Final   Culture   Final    NO GROWTH 5 DAYS Performed at [facility], [place] [address], [place], [place] [address]    Report Status 10/07/2017 FINAL  Final  Urine culture     Status: Abnormal   Collection Time: 10/02/17  8:39 AM  Result Value Ref Range Status   Specimen Description URINE, RANDOM  Final   Special Requests   Final    NONE Performed at [facility], [place] [address] [place], [place] [address]    Culture MULTIPLE SPECIES PRESENT, SUGGEST  RECOLLECTION (A)  Final   Report Status 10/04/2017 FINAL  Final  Urine culture     Status: Abnormal   Collection Time: 10/02/17  2:20 PM  Result Value Ref Range Status   Specimen Description URINE, CATHETERIZED  Final   Special Requests   Final    NONE Performed at [facility], [place] [address], [place], [place] [address]    Culture (A)  Final    20,000 COLONIES/mL METHICILLIN RESISTANT STAPHYLOCOCCUS AUREUS   Report Status 10/04/2017 FINAL  Final   Organism ID, Bacteria METHICILLIN RESISTANT STAPHYLOCOCCUS AUREUS (A)  Final      Susceptibility   Methicillin resistant staphylococcus aureus - MIC*    CIPROFLOXACIN >=8 RESISTANT Resistant     GENTAMICIN <=0.5 SENSITIVE Sensitive     NITROFURANTOIN <=16 SENSITIVE Sensitive     OXACILLIN >=4 RESISTANT Resistant     TETRACYCLINE <=1 SENSITIVE Sensitive     VANCOMYCIN <=0.5 SENSITIVE Sensitive     TRIMETH/SULFA >=320 RESISTANT Resistant     CLINDAMYCIN <=0.25 SENSITIVE Sensitive     RIFAMPIN <=0.5 SENSITIVE Sensitive     Inducible Clindamycin NEGATIVE Sensitive     * 20,000 COLONIES/mL METHICILLIN RESISTANT STAPHYLOCOCCUS AUREUS  Urine Culture     Status: Abnormal   Collection Time: 10/02/17  6:31 PM  Result Value Ref Range Status   Specimen Description KIDNEY RIGHT  Final   Special Requests   Final    NONE Performed at [facility], [place] [address],  [place], [place] [address]    Culture (A)  Final    50,000 COLONIES/mL METHICILLIN RESISTANT STAPHYLOCOCCUS AUREUS   Report Status 10/04/2017 FINAL  Final   Organism ID, Bacteria METHICILLIN RESISTANT STAPHYLOCOCCUS AUREUS (A)  Final      Susceptibility   Methicillin resistant staphylococcus aureus - MIC*    CIPROFLOXACIN >=8 RESISTANT Resistant     ERYTHROMYCIN >=8 RESISTANT Resistant     GENTAMICIN <=0.5 SENSITIVE Sensitive     OXACILLIN >=4 RESISTANT Resistant     TETRACYCLINE <=1 SENSITIVE Sensitive     VANCOMYCIN 1 SENSITIVE Sensitive     TRIMETH/SULFA >=320  RESISTANT Resistant     CLINDAMYCIN <=0.25 SENSITIVE Sensitive     RIFAMPIN <=0.5 SENSITIVE Sensitive     Inducible Clindamycin NEGATIVE Sensitive     * 50,000 COLONIES/mL METHICILLIN RESISTANT STAPHYLOCOCCUS AUREUS  MRSA PCR Screening     Status: Abnormal   Collection Time: 10/02/17  6:54 PM  Result Value Ref Range Status   MRSA by PCR POSITIVE (A) NEGATIVE Final    Comment:        The GeneXpert MRSA Assay (FDA approved for NASAL specimens only), is one component of a comprehensive MRSA colonization surveillance program. It is not intended to diagnose MRSA infection nor to guide or monitor treatment for MRSA infections. RESULT CALLED TO, READ BACK BY AND VERIFIED WITH: TDECAREAUX,RN @2050  10/02/17 BY LHOWARD Performed at [facility], [place] [address] [place], [place] [address]   Urine culture     Status: Abnormal   Collection Time: 10/02/17  7:35 PM  Result Value Ref Range Status   Specimen Description KIDNEY RIGHT  Final   Special Requests   Final    Normal Performed at [facility], [place] [address], [place], [place] [address]    Culture (A)  Final    >=100,000 COLONIES/mL METHICILLIN RESISTANT STAPHYLOCOCCUS AUREUS   Report Status 10/04/2017 FINAL  Final   Organism ID, Bacteria METHICILLIN RESISTANT STAPHYLOCOCCUS AUREUS (A)  Final      Susceptibility   Methicillin resistant staphylococcus aureus - MIC*    CIPROFLOXACIN >=8 RESISTANT Resistant     ERYTHROMYCIN >=8 RESISTANT Resistant     GENTAMICIN <=0.5 SENSITIVE Sensitive     OXACILLIN >=4 RESISTANT Resistant     TETRACYCLINE <=1 SENSITIVE Sensitive     VANCOMYCIN 1 SENSITIVE Sensitive     TRIMETH/SULFA >=320 RESISTANT Resistant     CLINDAMYCIN <=0.25 SENSITIVE Sensitive     RIFAMPIN <=0.5 SENSITIVE Sensitive     Inducible Clindamycin NEGATIVE Sensitive     * >=100,000 COLONIES/mL METHICILLIN RESISTANT STAPHYLOCOCCUS AUREUS      Radiology Studies: No results found.   Scheduled Meds: .  anastrozole  1 mg Oral Daily  . artificial tears  1 application Both Eyes QHS  . collagenase   Topical Daily  . diphenoxylate-atropine  1 tablet Oral Daily  . enoxaparin (LOVENOX) injection  40 mg Subcutaneous Q24H  . fentaNYL  25 mcg Transdermal Q72H  . insulin aspart  0-9 Units Subcutaneous TID WC  . levothyroxine  150 mcg Oral QAC breakfast  . magnesium oxide  800 mg Oral BID  . mouth rinse  15 mL Mouth Rinse BID  . multivitamin with minerals  1 tablet Oral Daily  . nystatin  5 mL Oral QID  . polyvinyl alcohol  1 drop Both Eyes QHS  . potassium chloride  20 mEq Oral Daily  . pregabalin  50 mg Oral BID  . protein supplement  shake  11 oz Oral TID BM  . psyllium  1 packet Oral Daily  . sodium chloride flush  10-40 mL Intracatheter Q12H  . sodium chloride flush  3 mL Intravenous Q12H  . sodium chloride flush  5 mL Intracatheter Q8H  . Tbo-Filgrastim  480 mcg Subcutaneous 6 days   Continuous Infusions: . DAPTOmycin (CUBICIN)  IV Stopped (10/08/17 1702)  . sodium chloride 0.9 % 1,000 mL infusion 10 mL/hr at 10/08/17 0500     LOS: 7 days   Time Spent in minutes   30 minutes  [name] D.O. on 10/09/2017 at 11:18 AM  Between 7am to 7pm - Pager - [phone]  After 7pm go to [email or web address] - password [email or web address]  And look for the night coverage person covering for me after hours  [facility] Office  [phone]

## 2017-10-09 NOTE — Progress Notes (Signed)
When giving report to night shift RN, patient notices a new, hard lump under the skin under the left axilla.  This area was marked per RN.  Please defer to physician for assessment tomorrow.

## 2017-10-09 NOTE — Progress Notes (Signed)
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 Fluid collection w/ CT guided drain placed on 09-16-17, removed 2/2 malpositioning. Cultures + last adm for  GBS, lactobacillus, serratia and yeast on cipro PTA. Adm 10/03/17 from SNF with fevers, AMS. In ED L nephrostomy fell out w/purulent urine from site. Replaced and R tube exchanged. We were asked to see for recurrent AKI on CKD creatinine up to 3.7  (most recent creatinine PTA new baseline of around 2; baseline prior to 08/2017 admit was around 02 Jun 2017. Nadir this admission improved to 1.3   Assessment/Recommendations  1. AKI on CKD - most recent (new) baseline creatinine around 2 prior to current admission. Last AKI was early 08/2017 w/creatinine at that time up to 4.28, had bilat perc tubes placed to divert urine from bladder (colovesical fistula), + vol depletion.  1. Current AKI max creatinine 3.74 2/2 vol depletion, displaced RL perc tube (replaced) and sepsis 2. Creatinine improved this time to low of 1.3, back to 1.4 today and behind on fluids today  2. Volume depletion - no IVF right now, relying on just po intake. Has HUGE stool volumes (now that eating and drinking) and I still have concerns about recurrent volume depletion  1. If behind again tomorrow, will give a liter of fluids (simulating what we might do outpt)  3. Hypokalemia - Had K runs X 4 for 4 days in a row last 3/7. 1. K stable on 20 elixir daily now (not tolerant of po tabs)  4. Hypomagnesemia - s/p 3 days Mg infusion. Mg back down to 1.3 despite oral. Redosed IV 3/9.  1. Getting daily Mg levels.   5. Metabolic acidosis- Corrected. Stopped bicarb drip. Has not required po suppl  6. Urosepsis -  all UC + MRSA. ID recommending dapto X 2 weeks 7. Chronic obstructive uropathy/colovesical fistula - L PCN replaced, R exchanged 3/3; ultimate need for surgical diversion down the road  8. Anemia - with prior Fe def (unable to give IV Fe for tsat of 7 last adm due to infection; similar issues now). Transfuse prn.  9. Sacral decubitus - hydrotherapy  10. Chronic neutropenia - on chronic filagrastin   11. Encephalopathy resolved with treatment of infection, restoration of volume 12. MRSA PCR +  Disposition - I still have concerns about re-development of volume depletion as she tries to keep up with large ostomy losses. May be necessary to arrange for her to get a liter of fluids a couple of times a week (she has done this before at home with [facility] for this reason).  2nd concern is electrolyte management. K and phos now stable; Mg required IV repletion again 3/9  [name], MD [facility] [phone] Pager 10/09/2017, 11:21 AM  Subjective/Interval History:  Still 2L ostomy output on top of 3.6 liter UOP (Stool output may have been down a bit w/metamucil and lomotil) Creatinine back up a hair today K fine  Objective Vital signs in last 24 hours: Vitals:   10/09/17 0400 10/09/17 0444 10/09/17 0650 10/09/17 0813  BP:  130/70  139/82  Pulse:  71  74  Resp: 16 12  (!) 21  Temp:  98.5 F (36.9 C)  98.5 F (36.9 C)  TempSrc:  Oral  Oral  SpO2:  98%  100%  Weight:   87.9 kg (193 lb 12.6 oz)   Height:       Weight change: -1.912 kg (-3.5 oz)  Intake/Output Summary (Last 24 hours) at 10/09/2017 1121 Last data filed at 10/09/2017 0900 Gross per 24 hour  Intake 1580 ml  Output 5675 ml  Net -4095 ml   Physical Exam:  Blood pressure 139/82, pulse 74, temperature 98.5 F (36.9 C), temperature source Oral, resp. rate (!) 21, height 5\' 2"  (1.575 m), weight 87.9 kg (193 lb 12.6 oz), SpO2 100 %.   Awake and alert L IJ 3L Lungs clear [id number] No S3 1/6 systolic murmur no  diastolic murmur Ostomy in place liquid drainage No LE edema PCN sites intact Sacral decub covered   Recent Labs  Lab 10/03/17 0253 10/04/17 0220 10/05/17 0533 10/06/17 0240 10/07/17 0500 10/08/17 0500 10/09/17 0845  NA 141 145 147* 137 138 138 137  K 3.6 2.4* 2.8* 3.2* 4.1 4.6 4.4  CL 112* 106 104 100* 103 101 101  CO2 16* 27 29 28 27 24 25   GLUCOSE 155* 83 126* 111* 110* 104* 95  BUN 52* 49* 38* 35* 29* 26* 27*  CREATININE 3.27* 2.78* 2.11* 1.71* 1.38* 1.30* 1.49*  1.44*  CALCIUM 8.5* 8.1* 8.1* 7.9* 8.1* 8.2* 8.5*  PHOS 5.3* 3.6 2.1* 2.8 3.1 4.0 4.1   Results for [name], [name] (MRN [id number]) as of 10/09/2017 11:23  10/04/2017 10:41 10/05/2017 05:00 10/06/2017 02:40 10/08/2017 05:00 10/09/2017 08:45  Magnesium 1.7 1.5 (L) 1.6 (L) 1.3 (L) 2.0   Recent Labs  Lab 10/04/17 0220  10/07/17 0500 10/08/17 0500 10/09/17 0845  AST 18  --   --   --   --   ALT 19  --   --   --   --   ALKPHOS 226*  --   --   --   --   BILITOT 0.7  --   --   --   --   PROT 5.6*  --   --   --   --   ALBUMIN 1.5*   < > 1.9* 2.0* 2.2*   < > = values in this interval not displayed.    Recent Labs  Lab 10/06/17 0240 10/07/17 0500 10/08/17 0500 10/09/17 0845  WBC 21.7* 16.4* 10.6* 6.5  HGB 8.8* 9.4* 9.7* 9.7*  HCT 28.3* 31.3* 31.6* 32.0*  MCV 92.2 93.4 94.6 95.2  PLT 274 280 270 262    Recent Labs  Lab 10/02/17 2221 10/03/17 0253  CKTOTAL 16* 18*   Recent Labs  Lab 10/08/17 0816 10/08/17 1223 10/08/17 1607 10/08/17 2134 10/09/17 0806  GLUCAP 93 112* 135* 121* 107*    Urine cultures all + MRSA Blood cultures negative to date  Medications: . DAPTOmycin (CUBICIN)  IV Stopped (10/08/17 1702)  . sodium chloride 0.9 % 1,000 mL infusion 10 mL/hr at 10/08/17 0500   . anastrozole  1 mg Oral Daily  . artificial tears  1 application Both Eyes QHS  . collagenase   Topical Daily  . diphenoxylate-atropine  1 tablet Oral Daily  . enoxaparin (LOVENOX) injection  40 mg Subcutaneous Q24H  .  fentaNYL  25 mcg Transdermal Q72H  . insulin aspart  0-9 Units Subcutaneous TID WC  . levothyroxine  150 mcg Oral QAC breakfast  . magnesium oxide  800 mg Oral BID  . mouth rinse  15 mL Mouth Rinse BID  . multivitamin with minerals  1 tablet Oral Daily  .  nystatin  5 mL Oral QID  . polyvinyl alcohol  1 drop Both Eyes QHS  . potassium chloride  20 mEq Oral Daily  . pregabalin  50 mg Oral BID  . protein supplement shake  11 oz Oral TID BM  . psyllium  1 packet Oral Daily  . sodium chloride flush  10-40 mL Intracatheter Q12H  . sodium chloride flush  3 mL Intravenous Q12H  . sodium chloride flush  5 mL Intracatheter Q8H  . Tbo-Filgrastim  480 mcg Subcutaneous 6 days

## 2017-10-09 NOTE — Progress Notes (Signed)
All dressings were changed at this time.  Sacral wounds in multiple areas and at varied stages of healing but all show granulation.  Each area cleansed w/saline; Santyl applied and dry dressings to cover; secured w/medipore. Nephrostomy tube dressings also changed - left nephrostomy tube site is reddened and w/serosanquinous drainage. Defer to wound care for appropriate orders.  Patient c/o pain to right side frequently; patient expresses concern if there are new issues brewing w/the right nephrostomy tube as the output is also half the amount noted on the left.

## 2017-10-10 ENCOUNTER — Inpatient Hospital Stay (HOSPITAL_COMMUNITY): Payer: BLUE CROSS/BLUE SHIELD

## 2017-10-10 LAB — CK: Total CK: 202 U/L (ref 38–234)

## 2017-10-10 LAB — GLUCOSE, CAPILLARY
GLUCOSE-CAPILLARY: 99 mg/dL (ref 65–99)
GLUCOSE-CAPILLARY: 144 mg/dL — AB (ref 65–99)
Glucose-Capillary: 108 mg/dL — ABNORMAL HIGH (ref 65–99)
Glucose-Capillary: 141 mg/dL — ABNORMAL HIGH (ref 65–99)

## 2017-10-10 LAB — CBC
HCT: 31.4 % — ABNORMAL LOW (ref 36.0–46.0)
HEMOGLOBIN: 9.8 g/dL — AB (ref 12.0–15.0)
MCH: 29.3 pg (ref 26.0–34.0)
MCHC: 31.2 g/dL (ref 30.0–36.0)
MCV: 93.7 fL (ref 78.0–100.0)
Platelets: 243 10*3/uL (ref 150–400)
RBC: 3.35 MIL/uL — ABNORMAL LOW (ref 3.87–5.11)
RDW: 16.1 % — AB (ref 11.5–15.5)
WBC: 5.7 10*3/uL (ref 4.0–10.5)

## 2017-10-10 LAB — MAGNESIUM: MAGNESIUM: 1.4 mg/dL — AB (ref 1.7–2.4)

## 2017-10-10 LAB — RENAL FUNCTION PANEL
ALBUMIN: 2.3 g/dL — AB (ref 3.5–5.0)
Anion gap: 9 (ref 5–15)
BUN: 32 mg/dL — AB (ref 6–20)
CHLORIDE: 101 mmol/L (ref 101–111)
CO2: 24 mmol/L (ref 22–32)
CREATININE: 1.4 mg/dL — AB (ref 0.44–1.00)
Calcium: 8.7 mg/dL — ABNORMAL LOW (ref 8.9–10.3)
GFR calc non Af Amer: 39 mL/min — ABNORMAL LOW (ref >60)
GFR, EST AFRICAN AMERICAN: 45 mL/min — AB (ref >60)
Glucose, Bld: 111 mg/dL — ABNORMAL HIGH (ref 65–99)
PHOSPHORUS: 4.6 mg/dL (ref 2.5–4.6)
POTASSIUM: 4.4 mmol/L (ref 3.5–5.1)
Sodium: 134 mmol/L — ABNORMAL LOW (ref 135–145)

## 2017-10-10 MED ORDER — SODIUM CHLORIDE 0.9 % IV SOLN: INTRAVENOUS | Status: AC

## 2017-10-10 MED ORDER — MAGNESIUM SULFATE 2 GM/50ML IV SOLN
2.0000 g | Freq: Once | INTRAVENOUS | Status: AC
  Administered 2017-10-10: 2 g via INTRAVENOUS
  Filled 2017-10-10: qty 50

## 2017-10-10 MED ORDER — MAGNESIUM SULFATE 2 GM/50ML IV SOLN: 2.0000 g | Freq: Once | INTRAVENOUS | Status: DC

## 2017-10-10 NOTE — Progress Notes (Signed)
Clinical Social Worker following patient and family for support and discharge plan. Patient in bed and is pleasant. Patient stated she had a good weekend and is starting to feel a lot better compared to when she first got admitted. Patient stated that MD wants to keep her another day to do one more round of hydrotherapy before going back to facility. Patient stated she is appreciative of CSW role in her care. Patient will discharge back to [facility] once medically stable.   Rhea Pink, MSW,  [place]

## 2017-10-10 NOTE — Progress Notes (Signed)
Physical Therapy Wound Treatment Patient Details  Name: [name] MRN: [id number] Date of Birth: Dec 27, 1955  Today's Date: 10/10/2017 Time: 9562-1308 Time Calculation (min): 64 min  Subjective  Subjective: Willing to stay for one more day of hydrotherapy Patient and Family Stated Goals: to heal   Pain Score:  Pt premedicated prior to treatment, however pulse lavage continues to elicit 65/78 pain   Wound Assessment  Pressure Injury 10/02/17 Unstageable - Full thickness tissue loss in which the base of the ulcer is covered by slough (yellow, tan, gray, green or brown) and/or eschar (tan, brown or black) in the wound bed. Sacrum 9 x12  open with yellow wound base puru (Active)  Wound Image   10/05/2017  6:00 PM  Dressing Type ABD;Barrier Film (skin prep);Gauze (Comment);Moist to dry 10/10/2017 12:00 PM  Dressing Changed;Clean;Dry;Intact 10/10/2017 12:00 PM  Dressing Change Frequency Daily 10/10/2017 12:00 PM  State of Healing Early/partial granulation 10/10/2017 12:00 PM  Site / Wound Assessment Pink;Yellow;Painful 10/10/2017 12:00 PM  % Wound base Red or Granulating 65% 10/10/2017 12:00 PM  % Wound base Yellow/Fibrinous Exudate 35% 10/10/2017 12:00 PM  Peri-wound Assessment Erythema (blanchable);Pink 10/10/2017 12:00 PM  Wound Length (cm) 9 cm 10/04/2017 12:00 PM  Wound Width (cm) 12 cm 10/04/2017 12:00 PM  Wound Surface Area (cm^2) 108 cm^2 10/04/2017 12:00 PM  Margins Unattached edges (unapproximated) 10/10/2017 12:00 PM  Drainage Amount Minimal 10/10/2017 12:00 PM  Drainage Description Purulent;Serosanguineous 10/10/2017 12:00 PM  Treatment Cleansed;Debridement (Selective);Packing (Saline gauze) 10/10/2017 12:00 PM   Santyl applied to wound bed prior to dressing.   Hydrotherapy Pulsed lavage therapy - wound location: Sacrum, hip Pulsed Lavage with Suction (psi): 4 psi(due to pain) Pulsed Lavage with Suction - Normal Saline Used: 1000 mL Pulsed Lavage Tip: Tip with splash shield Selective  Debridement Selective Debridement - Location: sacrum, hip Selective Debridement - Tools Used: Scalpel;Forceps Selective Debridement - Tissue Removed: necrotic, yellow slough   Wound Assessment and Plan  Wound Therapy - Assess/Plan/Recommendations Wound Therapy - Clinical Statement: The adhered slough to the central portion of the wound has begun to loosen and soften and a small amount was able to be debrided. Epidermal tissue has divided the wound into the central main wound and a right portion that has granulation tissue visible under to adhered slough. Will continue to debride as able. Wound Therapy - Functional Problem List: Decreased sitting tolerance Factors Delaying/Impairing Wound Healing: Diabetes Mellitus;Immobility Hydrotherapy Plan: Debridement;Dressing change;Patient/family education;Pulsatile lavage with suction Wound Therapy - Frequency: 6X / week Wound Therapy - Follow Up Recommendations: Skilled nursing facility Wound Plan: see above  Wound Therapy Goals- Improve the function of patient's integumentary system by progressing the wound(s) through the phases of wound healing (inflammation - proliferation - remodeling) by: Decrease Necrotic Tissue to: 10% Decrease Necrotic Tissue - Progress: Progressing toward goal Increase Granulation Tissue to: 90% Increase Granulation Tissue - Progress: Progressing toward goal Goals/treatment plan/discharge plan were made with and agreed upon by patient/family: Yes Time For Goal Achievement: 7 days Wound Therapy - Potential for Goals: Good  Goals will be updated until maximal potential achieved or discharge criteria met.  Discharge criteria: when goals achieved, discharge from hospital, MD decision/surgical intervention, no progress towards goals, refusal/missing three consecutive treatments without notification or medical reason.  GP    Dani Gobble. Migdalia Dk PT, DPT Acute Rehabilitation  [phone] Pager [phone]   [place] 10/10/2017, 12:48 PM

## 2017-10-10 NOTE — Progress Notes (Signed)
PROGRESS NOTE    [name]  YTK:[id number] DOB: 10/13/1955 DOA: 10/02/2017 PCP: Ann Held, DO   Brief Narrative:  62 year old female with history of endometrial cancer, ovarian cancer, diabetes, hypothyroidism, hypertension, chronic kidney disease who presented with worsening delirium and renal function.  Found to have ureteral stent infection with MRSA, placed on daptomycin.  Infectious disease consulted and appreciated, recommending discharge with Jeani Hawking nasal lid twice daily for 2 weeks.  Nephrology also consulted and following.  Neurology consulted and feels patient would need urinary diversion procedure however will be sometime in the future.  Currently pending SNF placement.  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 -Patient had replacement of left PCN-was noted to have purulent urine from exit site. -Right percutaneous tube was exchanged on 10/02/2017 -Creatinine currently 1.40 -Nephrology consulted and appreciated  Sepsis secondary to MRSA UTI/Stent infection -patient was tachycardic with tachypnea on the day of admission -Patient also found to have neutropenia however that is chronic, currently WBCs are up to 36 -UA showed many bacteria, TNTC WBC, large leukocytes -Blood cultures no growth to date -Urine culture shows MRSA -as noted above, with replacement of left PCN, purulent urine was noted -Infectious disease consulted and appreciated, currently on daptomycin.  Recommended use of linezolid for extended 2 weeks at discharge  Hypomagnesemia -currently on oral magnesium supplementation  -will also given IV form -suspect may be adding to patient's  high output -continue to monitor   Hypokalemia -resolved, continue to monitor BMP  Metabolic acidosis -Please secondary to AKI, patient did require bicarbonate drip which was discontinued  Anemia secondary to chronic disease/iron deficiency -Hemoglobin dropped to 7.3, received transfusion -Hemglobin stable, currently 9.8 -Continue to monitor CBC  Sacral decubitus ulcer -PT currently advising hydrotherapy -discussed with PT, will continue hydrotherapy today and likely on 10/11/2017  Encephalopathy, metabolic -Resolved, suspect likely secondary to infection and acute kidney injury  Metastatic endometrial cancer/chronic neutropenia -Patient takes filgastrin at home -Continue Arimidex  Left axillary lymph node -discussed with oncology, Dr. 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  Patient did have this done at home with home health in the past.  Hypothyroidism -Continue Synthroid  Diabetes mellitus, type II -Continue insulin sliding scale, CBG monitoring-CBGs have been stable -lantus currently held due to poor oral intake -have placed patient on regular diet (have told husband and RN that we will liberalize her diet to aid in her nutritional status   Protein calorie malnutrition, moderate -Patient consulted, continue supplements -Patient has had better intake over the past few days -see discussion above  Goals of care/pain control -palliative care consulted and appreciated, continues to be full code  DVT Prophylaxis  lovenox  Code  Status: Full  Family Communication: husband at bedside  Disposition Plan: Admitted, continue to monitor in stepdown. Pending SNF when medically stable.   Consultants  Infectious disease Nephrology Interventional radiology Urology Palliative care Oncology, Dr. Alvy Bimler, via phone  Procedures  Left PCN replacement and exchange of right PCN interventional radiology  Antibiotics   Anti-infectives (From admission, onward)   Start     Dose/Rate Route Frequency Ordered Stop   10/06/17 1600  DAPTOmycin (CUBICIN) 500 mg in sodium chloride 0.9 % IVPB     500 mg 220 mL/hr over 30 Minutes Intravenous Every 24 hours 10/06/17 1041     10/04/17 1200  vancomycin (VANCOCIN) IVPB 1000 mg/200 mL premix  Status:  Discontinued     1,000 mg 200 mL/hr over 60 Minutes Intravenous Every 48 hours 10/02/17 0903 10/02/17 1539   10/04/17 0800  levofloxacin (LEVAQUIN) IVPB 500 mg  Status:  Discontinued     500 mg 100 mL/hr over 60 Minutes Intravenous Every 48 hours 10/02/17 0903 10/02/17 1539   10/03/17 1600  DAPTOmycin (CUBICIN) 500 mg in sodium chloride 0.9 % IVPB  Status:  Discontinued     500 mg 220 mL/hr over 30 Minutes Intravenous Every 48 hours 10/02/17 1603 10/06/17 1041   10/02/17 2000  aztreonam (AZACTAM) 500 mg in dextrose 5 % 50 mL IVPB  Status:  Discontinued     500 mg 100 mL/hr over 30 Minutes Intravenous Every 8 hours 10/02/17 0903 10/02/17 1539   10/02/17 2000  meropenem (MERREM) 500 mg in sodium chloride 0.9 % 100 mL IVPB  Status:  Discontinued     500 mg 200 mL/hr over 30 Minutes Intravenous Every 12 hours 10/02/17 1603 10/04/17 0906   10/02/17 0915  aztreonam (AZACTAM) 2 g in sodium chloride 0.9 % 100 mL IVPB     2 g 200 mL/hr over 30 Minutes Intravenous  Once 10/02/17 0902 10/02/17 1007   10/02/17 0915  vancomycin (VANCOCIN) 1,500 mg in sodium chloride 0.9 % 500 mL IVPB     1,500 mg 250 mL/hr over 120 Minutes Intravenous  Once 10/02/17 0902 10/02/17 1243   10/02/17 0730  levofloxacin  (LEVAQUIN) IVPB 750 mg     750 mg 100 mL/hr over 90 Minutes Intravenous  Once 10/02/17 0723 10/02/17 0915   10/02/17 0730  aztreonam (AZACTAM) 2 g in sodium chloride 0.9 % 100 mL IVPB  Status:  Discontinued     2 g 200 mL/hr over 30 Minutes Intravenous  Once 10/02/17 0723 10/02/17 0902   10/02/17 0730  vancomycin (VANCOCIN) IVPB 1000 mg/200 mL premix  Status:  Discontinued     1,000 mg 200 mL/hr over 60 Minutes Intravenous  Once 10/02/17 0723 10/02/17 0902      Subjective:   [name] seen and examined today.  Patient states she is feeling better today.  Was able to have a bit of formed stool yesterday.  Denies current chest pain, shortness of breath, abdominal pain, nausea or vomiting, dizziness or headache.  Objective:   Vitals:   10/09/17 2332 10/10/17 0000 10/10/17 0417 10/10/17 0830  BP: 130/66  127/69 (!) 115/54  Pulse: 76  90 74  Resp: 15 13 18 17   Temp: 98.8 F (37.1 C)  98.5 F (36.9 C) 98.4 F (36.9 C)  TempSrc: Oral  Oral Oral  SpO2: 98%  98% 98%  Weight:      Height:        Intake/Output Summary (Last 24 hours) at 10/10/2017 1202 Last data filed at 10/10/2017 0804 Gross per 24 hour  Intake 270 ml  Output 3045 ml  Net -2775 ml   Filed Weights   10/07/17 0541  10/08/17 0625 10/09/17 0650  Weight: 88.9 kg (196 lb) 89.8 kg (198 lb) 87.9 kg (193 lb 12.6 oz)   Exam  General: Well developed, well nourished, NAD, appears stated age  HEENT: NCAT, PERRLA, EOMI, Anicteic Sclera, mucous membranes moist.   Neck: Supple  Axilla: left axillary lymph node palpated   Cardiovascular: S1 S2 auscultated, 1/6 SEM, RRR  Respiratory: Clear to auscultation bilaterally with equal chest rise  Abdomen: Soft, nontender, nondistended, + bowel sounds, +ostomy   Extremities: warm dry without cyanosis clubbing or edema  Neuro: AAOx3, nonfocal  Psych: Normal affect and demeanor with intact judgement and insight  Data Reviewed: I have personally reviewed following labs and  imaging studies  CBC: Recent Labs  Lab 10/06/17 0240 10/07/17 0500 10/08/17 0500 10/09/17 0845 10/10/17 0425  WBC 21.7* 16.4* 10.6* 6.5 5.7  HGB 8.8* 9.4* 9.7* 9.7* 9.8*  HCT 28.3* 31.3* 31.6* 32.0* 31.4*  MCV 92.2 93.4 94.6 95.2 93.7  PLT 274 280 270 262 562   Basic Metabolic Panel: Recent Labs  Lab 10/05/17 0500  10/06/17 0240 10/07/17 0500 10/08/17 0500 10/09/17 0845 10/10/17 0420 10/10/17 0425  NA  --    < > 137 138 138 137 134*  --   K  --    < > 3.2* 4.1 4.6 4.4 4.4  --   CL  --    < > 100* 103 101 101 101  --   CO2  --    < > 28 27 24 25 24   --   GLUCOSE  --    < > 111* 110* 104* 95 111*  --   BUN  --    < > 35* 29* 26* 27* 32*  --   CREATININE  --    < > 1.71* 1.38* 1.30* 1.49*  1.44* 1.40*  --   CALCIUM  --    < > 7.9* 8.1* 8.2* 8.5* 8.7*  --   MG 1.5*  --  1.6*  --  1.3* 2.0  --  1.4*  PHOS  --    < > 2.8 3.1 4.0 4.1 4.6  --    < > = values in this interval not displayed.   GFR: Estimated Creatinine Clearance: 42.3 mL/min (A) (by C-G formula based on SCr of 1.4 mg/dL (H)). Liver Function Tests: Recent Labs  Lab 10/04/17 0220  10/06/17 0240 10/07/17 0500 10/08/17 0500 10/09/17 0845 10/10/17 0420  AST 18  --   --   --   --   --   --   ALT 19  --   --   --   --   --   --   ALKPHOS 226*  --   --   --   --   --   --   BILITOT 0.7  --   --   --   --   --   --   PROT 5.6*  --   --   --   --   --   --   ALBUMIN 1.5*   < > 1.7* 1.9* 2.0* 2.2* 2.3*   < > = values in this interval not displayed.   No results for input(s): LIPASE, AMYLASE in the last 168 hours. No results for input(s): AMMONIA in the last 168 hours. Coagulation Profile: No results for input(s): INR, PROTIME in the last 168 hours. Cardiac Enzymes: Recent Labs  Lab 10/10/17 0425  CKTOTAL 202   BNP (last 3 results) No results for  input(s): PROBNP in the last 8760 hours. HbA1C: No results for input(s): HGBA1C in the last 72 hours. CBG: Recent Labs  Lab 10/09/17 0806 10/09/17 1223  10/09/17 1618 10/09/17 2109 10/10/17 0803  GLUCAP 107* 166* 122* 118* 99   Lipid Profile: No results for input(s): CHOL, HDL, LDLCALC, TRIG, CHOLHDL, LDLDIRECT in the last 72 hours. Thyroid Function Tests: No results for input(s): TSH, T4TOTAL, FREET4, T3FREE, THYROIDAB in the last 72 hours. Anemia Panel: No results for input(s): VITAMINB12, FOLATE, FERRITIN, TIBC, IRON, RETICCTPCT in the last 72 hours. Urine analysis:    Component Value Date/Time   COLORURINE YELLOW 10/02/2017 0836   APPEARANCEUR CLOUDY (A) 10/02/2017 0836   LABSPEC 1.025 10/02/2017 0836   LABSPEC 1.015 11/17/2015 1436   PHURINE 5.5 10/02/2017 0836   GLUCOSEU NEGATIVE 10/02/2017 0836   GLUCOSEU 2,000 11/17/2015 1436   HGBUR MODERATE (A) 10/02/2017 0836   BILIRUBINUR NEGATIVE 10/02/2017 0836   BILIRUBINUR neg 08/30/2016 1025   BILIRUBINUR Negative 11/17/2015 1436   KETONESUR 15 (A) 10/02/2017 0836   PROTEINUR >300 (A) 10/02/2017 0836   UROBILINOGEN negative 08/30/2016 1025   UROBILINOGEN 0.2 11/17/2015 1436   NITRITE NEGATIVE 10/02/2017 0836   LEUKOCYTESUR LARGE (A) 10/02/2017 0836   LEUKOCYTESUR Moderate 11/17/2015 1436   Sepsis Labs: @LABRCNTIP (procalcitonin:4,lacticidven:4)  ) Recent Results (from the past 240 hour(s))  Blood culture (routine x 2)     Status: None   Collection Time: 10/02/17  7:00 AM  Result Value Ref Range Status   Specimen Description BLOOD LEFT ANTECUBITAL  Final   Special Requests IN PEDIATRIC BOTTLE Blood Culture adequate volume  Final   Culture   Final    NO GROWTH 5 DAYS Performed at [facility], [place] [address], [place], [place] [address]    Report Status 10/07/2017 FINAL  Final  Blood culture (routine x 2)     Status: None   Collection Time: 10/02/17  7:25 AM  Result Value Ref Range Status   Specimen Description BLOOD LEFT HAND  Final   Special Requests IN PEDIATRIC BOTTLE Blood Culture adequate volume  Final   Culture   Final    NO GROWTH 5 DAYS Performed at  [facility], [place] [address], [name], [place] [address]    Report Status 10/07/2017 FINAL  Final  Urine culture     Status: Abnormal   Collection Time: 10/02/17  8:39 AM  Result Value Ref Range Status   Specimen Description URINE, RANDOM  Final   Special Requests   Final    NONE Performed at [facility], [place] [address] [place], [place] [address]    Culture MULTIPLE SPECIES PRESENT, SUGGEST RECOLLECTION (A)  Final   Report Status 10/04/2017 FINAL  Final  Urine culture     Status: Abnormal   Collection Time: 10/02/17  2:20 PM  Result Value Ref Range Status   Specimen Description URINE, CATHETERIZED  Final   Special Requests   Final    NONE Performed at [facility], [place] [address] [place], [place] [address]    Culture (A)  Final    20,000 COLONIES/mL METHICILLIN RESISTANT STAPHYLOCOCCUS AUREUS   Report Status 10/04/2017 FINAL  Final   Organism ID, Bacteria METHICILLIN RESISTANT STAPHYLOCOCCUS AUREUS (A)  Final      Susceptibility   Methicillin resistant staphylococcus aureus - MIC*    CIPROFLOXACIN >=8 RESISTANT Resistant     GENTAMICIN <=0.5 SENSITIVE Sensitive     NITROFURANTOIN <=16 SENSITIVE Sensitive  OXACILLIN >=4 RESISTANT Resistant     TETRACYCLINE <=1 SENSITIVE Sensitive     VANCOMYCIN <=0.5 SENSITIVE Sensitive     TRIMETH/SULFA >=320 RESISTANT Resistant     CLINDAMYCIN <=0.25 SENSITIVE Sensitive     RIFAMPIN <=0.5 SENSITIVE Sensitive     Inducible Clindamycin NEGATIVE Sensitive     * 20,000 COLONIES/mL METHICILLIN RESISTANT STAPHYLOCOCCUS AUREUS  Urine Culture     Status: Abnormal   Collection Time: 10/02/17  6:31 PM  Result Value Ref Range Status   Specimen Description KIDNEY RIGHT  Final   Special Requests   Final    NONE Performed at [facility], [place] [address], [place], [place] [address]    Culture (A)  Final    50,000 COLONIES/mL METHICILLIN RESISTANT STAPHYLOCOCCUS AUREUS   Report Status 10/04/2017 FINAL  Final   Organism  ID, Bacteria METHICILLIN RESISTANT STAPHYLOCOCCUS AUREUS (A)  Final      Susceptibility   Methicillin resistant staphylococcus aureus - MIC*    CIPROFLOXACIN >=8 RESISTANT Resistant     ERYTHROMYCIN >=8 RESISTANT Resistant     GENTAMICIN <=0.5 SENSITIVE Sensitive     OXACILLIN >=4 RESISTANT Resistant     TETRACYCLINE <=1 SENSITIVE Sensitive     VANCOMYCIN 1 SENSITIVE Sensitive     TRIMETH/SULFA >=320 RESISTANT Resistant     CLINDAMYCIN <=0.25 SENSITIVE Sensitive     RIFAMPIN <=0.5 SENSITIVE Sensitive     Inducible Clindamycin NEGATIVE Sensitive     * 50,000 COLONIES/mL METHICILLIN RESISTANT STAPHYLOCOCCUS AUREUS  MRSA PCR Screening     Status: Abnormal   Collection Time: 10/02/17  6:54 PM  Result Value Ref Range Status   MRSA by PCR POSITIVE (A) NEGATIVE Final    Comment:        The GeneXpert MRSA Assay (FDA approved for NASAL specimens only), is one component of a comprehensive MRSA colonization surveillance program. It is not intended to diagnose MRSA infection nor to guide or monitor treatment for MRSA infections. RESULT CALLED TO, READ BACK BY AND VERIFIED WITH: TDECAREAUX,RN @2050  10/02/17 BY LHOWARD Performed at [facility], [place] [address] [place], [place] [address]   Urine culture     Status: Abnormal   Collection Time: 10/02/17  7:35 PM  Result Value Ref Range Status   Specimen Description KIDNEY RIGHT  Final   Special Requests   Final    Normal Performed at [facility], [place] [address], [place],  [address]    Culture (A)  Final    >=100,000 COLONIES/mL METHICILLIN RESISTANT STAPHYLOCOCCUS AUREUS   Report Status 10/04/2017 FINAL  Final   Organism ID, Bacteria METHICILLIN RESISTANT STAPHYLOCOCCUS AUREUS (A)  Final      Susceptibility   Methicillin resistant staphylococcus aureus - MIC*    CIPROFLOXACIN >=8 RESISTANT Resistant     ERYTHROMYCIN >=8 RESISTANT Resistant     GENTAMICIN <=0.5 SENSITIVE Sensitive     OXACILLIN >=4 RESISTANT  Resistant     TETRACYCLINE <=1 SENSITIVE Sensitive     VANCOMYCIN 1 SENSITIVE Sensitive     TRIMETH/SULFA >=320 RESISTANT Resistant     CLINDAMYCIN <=0.25 SENSITIVE Sensitive     RIFAMPIN <=0.5 SENSITIVE Sensitive     Inducible Clindamycin NEGATIVE Sensitive     * >=100,000 COLONIES/mL METHICILLIN RESISTANT STAPHYLOCOCCUS AUREUS      Radiology Studies: No results found.   Scheduled Meds: . anastrozole  1 mg Oral Daily  . artificial tears  1 application Both Eyes QHS  . collagenase  Topical Daily  . diphenoxylate-atropine  1 tablet Oral Daily  . enoxaparin (LOVENOX) injection  40 mg Subcutaneous Q24H  . fentaNYL  25 mcg Transdermal Q72H  . insulin aspart  0-9 Units Subcutaneous TID WC  . levothyroxine  150 mcg Oral QAC breakfast  . magnesium oxide  800 mg Oral BID  . mouth rinse  15 mL Mouth Rinse BID  . multivitamin with minerals  1 tablet Oral Daily  . nystatin  5 mL Oral QID  . polyvinyl alcohol  1 drop Both Eyes QHS  . potassium chloride  20 mEq Oral Daily  . pregabalin  50 mg Oral BID  . protein supplement shake  11 oz Oral TID BM  . psyllium  1 packet Oral Daily  . sodium chloride flush  10-40 mL Intracatheter Q12H  . sodium chloride flush  3 mL Intravenous Q12H  . sodium chloride flush  5 mL Intracatheter Q8H  . Tbo-Filgrastim  480 mcg Subcutaneous 6 days   Continuous Infusions: . sodium chloride 100 mL/hr at 10/10/17 1017  . DAPTOmycin (CUBICIN)  IV Stopped (10/09/17 1627)  . sodium chloride 0.9 % 1,000 mL infusion 10 mL/hr at 10/08/17 0500     LOS: 8 days   Time Spent in minutes   30 minutes  Rhylynn Perdomo D.O. on 10/10/2017 at 12:02 PM  Between 7am to 7pm - Pager - [phone]  After 7pm go to [email or web address] - password [email or web address]  And look for the night coverage person covering for me after hours  [facility] Office  [phone]

## 2017-10-10 NOTE — Progress Notes (Signed)
Supervising Physician: Aletta Edouard  Patient Status:  [facility] - In-pt  Chief Complaint: Bilateral hydronephrosis  Subjective: Again assessed during wound care.  States her RN yesterday was concerned about left tube.   Allergies: Penicillins; Adhesive [tape]; Cefaclor; Erythromycin; Trimethoprim; Ultram [tramadol]; Fluconazole; Oxycodone; Pectin; and Sulfa antibiotics  Medications: Prior to Admission medications   Medication Sig Start Date End Date Taking? Authorizing Provider  acetaminophen (TYLENOL) 325 MG tablet Take 2 tablets (650 mg total) by mouth every 6 (six) hours as needed. 09/24/17  Yes Roxan Hockey, MD  ALPRAZolam Duanne Moron) 0.5 MG tablet Take 1 tablet (0.5 mg total) by mouth 3 (three) times daily as needed for anxiety. 09/24/17  Yes Emokpae, Courage, MD  anastrozole (ARIMIDEX) 1 MG tablet Take 1 tablet (1 mg total) by mouth daily. 09/24/17  Yes Roxan Hockey, MD  artificial tears (LACRILUBE) OINT ophthalmic ointment Place into both eyes at bedtime. Patient taking differently: Place 1 application into both eyes at bedtime.  09/24/17  Yes Emokpae, Courage, MD  Biotin 5 MG TABS Take 5 mg by mouth every morning.    Yes [provider]  Cholecalciferol (VITAMIN D3) 10000 UNITS capsule Take 10,000 Units by mouth once a week. Sunday evening's   Yes [provider]  collagenase (SANTYL) ointment Apply 1 application topically daily.   Yes [provider]  diphenhydrAMINE (BENADRYL) 25 mg capsule Take 1 capsule (25 mg total) by mouth every 8 (eight) hours as needed for itching, allergies or sleep. 09/24/17  Yes Emokpae, Courage, MD  diphenoxylate-atropine (LOMOTIL) 2.5-0.025 MG tablet Take 1 tablet by mouth 4 (four) times daily as needed for diarrhea or loose stools. TO PREVENT LOOSE BOWEL MOVEMENTS 09/24/17  Yes Emokpae, Courage, MD  DULoxetine (CYMBALTA) 30 MG capsule Take 1 capsule (30 mg total) by mouth 2 (two) times daily. 09/24/17  Yes Emokpae, Courage,  MD  famotidine (PEPCID) 20 MG tablet Take 20 mg by mouth daily as needed for heartburn or indigestion.   Yes [provider]  fentaNYL (DURAGESIC - DOSED MCG/HR) 50 MCG/HR Place 50 mcg onto the skin every 3 (three) days.   Yes [provider]  ferrous sulfate 325 (65 FE) MG tablet Take 1 tablet (325 mg total) by mouth at bedtime. 09/24/17  Yes Roxan Hockey, MD  filgrastim (NEUPOGEN) 480 MCG/1.6ML injection Inject 480 mcg/1.6 ml under the skin every 6 days for life (last dose 09/22/17) 09/24/17  Yes Emokpae, Courage, MD  HYDROmorphone (DILAUDID) 4 MG tablet Take 1.5 tablets (6 mg total) by mouth every 3 (three) hours as needed for moderate pain or severe pain. 09/24/17  Yes Emokpae, Courage, MD  insulin aspart (NOVOLOG) 100 UNIT/ML injection Inject 0-20 Units into the skin 3 (three) times daily with meals. Patient taking differently: Inject 2-12 Units into the skin See admin instructions. Per sliding scale; if BS <60, call MD. If BS 201-250=2 units, 251-300=4 units, 301-350=6 units, 351-400=8 units, 401-450=10 units, BS>450=12 units 09/24/17  Yes Emokpae, Courage, MD  insulin aspart (NOVOLOG) 100 UNIT/ML injection Inject 7 Units into the skin 3 (three) times daily with meals. 09/24/17  Yes Emokpae, Courage, MD  insulin glargine (LANTUS) 100 UNIT/ML injection Inject 0.25 mLs (25 Units total) into the skin 2 (two) times daily. 09/24/17  Yes Roxan Hockey, MD  levothyroxine (SYNTHROID, LEVOTHROID) 150 MCG tablet Take 1 tablet (150 mcg total) by mouth daily before breakfast. 09/25/17  Yes Emokpae, Courage, MD  loperamide (IMODIUM) 2 MG capsule Take 1-2 capsules (2-4 mg total) by mouth  every 8 (eight) hours as needed for diarrhea or loose stools (Use if >2 BM every 8 hours). 09/24/17  Yes Emokpae, Courage, MD  loratadine (CLARITIN) 10 MG tablet Take 10 mg by mouth every morning.    Yes [provider]  methocarbamol (ROBAXIN) 750 MG tablet Take 1 tablet (750 mg total) by mouth 3  (three) times daily. Patient taking differently: Take 750 mg by mouth 3 (three) times daily as needed for muscle spasms.  09/24/17  Yes Emokpae, Courage, MD  omega-3 acid ethyl esters (LOVAZA) 1 G capsule Take 1 g by mouth 2 (two) times daily.    Yes [provider]  ondansetron (ZOFRAN-ODT) 4 MG disintegrating tablet Take 4 mg by mouth every 6 (six) hours as needed for nausea or vomiting.  04/12/17  Yes [provider]  polyvinyl alcohol (LIQUIFILM TEARS) 1.4 % ophthalmic solution Place 1 drop into both eyes at bedtime. 09/24/17  Yes Emokpae, Courage, MD  pregabalin (LYRICA) 50 MG capsule Take 50 mg by mouth 2 (two) times daily.   Yes [provider]  Prenatal Vit-Fe Fumarate-FA (PRENATAL VITAMIN PO) Take 1 capsule by mouth daily. Takes prenatal because there are no dyes in it   Yes [provider]  prochlorperazine (COMPAZINE) 10 MG tablet Take 1 tablet (10 mg total) by mouth 2 (two) times daily as needed for nausea or vomiting. Patient taking differently: Take 10 mg by mouth 2 (two) times daily as needed for nausea or vomiting.  05/28/17  Yes Virgel Manifold, MD  promethazine (PHENERGAN) 25 MG tablet Take 1 tablet (25 mg total) by mouth every 6 (six) hours as needed for nausea or vomiting. Patient taking differently: Take 25 mg by mouth 4 (four) times daily as needed for nausea or vomiting.  05/28/17  Yes Virgel Manifold, MD  protein supplement shake (PREMIER PROTEIN) LIQD Take 325 mLs (11 oz total) by mouth 2 (two) times daily as needed (Pt to request). 09/24/17  Yes Emokpae, Courage, MD  psyllium (HYDROCIL/METAMUCIL) 95 % PACK Take 1 packet by mouth 2 (two) times daily. 09/24/17  Yes Emokpae, Courage, MD  ranitidine (ZANTAC) 150 MG tablet Take 1 tablet (150 mg total) by mouth 2 (two) times daily as needed for heartburn. 09/24/17  Yes Emokpae, Courage, MD  rosuvastatin (CRESTOR) 10 MG tablet Take 10 mg by mouth every evening.    Yes [provider]  sertraline  (ZOLOFT) 50 MG tablet Take 1.5 tablets (75 mg total) by mouth daily. 09/24/17  Yes Emokpae, Courage, MD  sitaGLIPtin (JANUVIA) 100 MG tablet Take 100 mg by mouth every morning.    Yes [provider]  traZODone (DESYREL) 100 MG tablet Take 1 tablet (100 mg total) by mouth at bedtime as needed for sleep. 09/24/17  Yes Emokpae, Courage, MD  tuberculin (TUBERSOL) 5 UNIT/0.1ML injection Inject 5 Units into the skin every 14 (fourteen) days.   Yes [provider]  ciprofloxacin (CIPRO) 500 MG tablet Take 1 tablet (500 mg total) by mouth 2 (two) times daily. Patient not taking: Reported on 10/02/2017 09/24/17   Roxan Hockey, MD  famotidine-calcium carbonate-magnesium hydroxide (PEPCID COMPLETE) 10-800-165 MG chewable tablet Chew 1 tablet by mouth daily as needed (INDIGESTION). Patient not taking: Reported on 10/02/2017 09/24/17   Roxan Hockey, MD  fentaNYL (DURAGESIC - DOSED MCG/HR) 75 MCG/HR Place 1 patch (75 mcg total) onto the skin every 3 (three) days. Patient not taking: Reported on 10/02/2017 09/25/17   Roxan Hockey, MD  Hydrocortisone (GERHARDT'S BUTT CREAM) CREA  Apply 1 application topically 4 (four) times daily. Patient not taking: Reported on 10/02/2017 09/24/17   Roxan Hockey, MD  lidocaine (LIDODERM) 5 % Place 1 patch onto the skin daily. Remove & Discard patch within 12 hours Patient not taking: Reported on 10/02/2017 09/25/17   Roxan Hockey, MD  lip balm (CARMEX) ointment Apply 1 application topically 2 (two) times daily. Patient not taking: Reported on 10/02/2017 09/24/17   Roxan Hockey, MD  Polyethyl Glycol-Propyl Glycol (SYSTANE OP) Place 1 drop into both eyes at bedtime.     [provider]  pregabalin (LYRICA) 75 MG capsule Take 1 capsule (75 mg total) by mouth 2 (two) times daily. Patient not taking: Reported on 10/02/2017 09/24/17   Roxan Hockey, MD     Vital Signs: BP (!) 115/54 (BP Location: Right Arm)   Pulse 74   Temp 98.4 F (36.9 C)  (Oral)   Resp 17   Ht 5\' 2"  (1.575 m)   Wt 193 lb 12.6 oz (87.9 kg)   SpO2 98%   BMI 35.44 kg/m   Physical Exam  Constitutional: She is oriented to person, place, and time. She appears well-developed.  Pulmonary/Chest: Effort normal. No respiratory distress.  Abdominal: Soft.  Neurological: She is alert and oriented to person, place, and time.  Skin: Skin is warm and dry.  Right PCN in place with small amount of clear, yellow urine in collection bag.  Insertion site c/d/i.  Left PCN in place with good urine output. Urine yellow with sediment.  Insertion site is erythematous, but currently without tenderness or purulence.   Psychiatric: She has a normal mood and affect. Her behavior is normal. Judgment and thought content normal.  Nursing note and vitals reviewed.   Imaging: No results found.  Labs:  CBC: Recent Labs    10/07/17 0500 10/08/17 0500 10/09/17 0845 10/10/17 0425  WBC 16.4* 10.6* 6.5 5.7  HGB 9.4* 9.7* 9.7* 9.8*  HCT 31.3* 31.6* 32.0* 31.4*  PLT 280 270 262 243    COAGS: Recent Labs    04/16/17 0047 09/02/17 0939 10/02/17 2221  INR 1.21 1.21 1.34    BMP: Recent Labs    10/07/17 0500 10/08/17 0500 10/09/17 0845 10/10/17 0420  NA 138 138 137 134*  K 4.1 4.6 4.4 4.4  CL 103 101 101 101  CO2 27 24 25 24   GLUCOSE 110* 104* 95 111*  BUN 29* 26* 27* 32*  CALCIUM 8.1* 8.2* 8.5* 8.7*  CREATININE 1.38* 1.30* 1.49*  1.44* 1.40*  GFRNONAA 40* 43* 36*  38* 39*  GFRAA 46* 50* 42*  44* 45*    LIVER FUNCTION TESTS: Recent Labs    09/15/17 0345 09/18/17 0416 10/02/17 0729  10/04/17 0220  10/07/17 0500 10/08/17 0500 10/09/17 0845 10/10/17 0420  BILITOT 0.3 0.2* 0.4  --  0.7  --   --   --   --   --   AST 15 21 15   --  18  --   --   --   --   --   ALT 21 21 20   --  19  --   --   --   --   --   ALKPHOS 131* 160* 248*  --  226*  --   --   --   --   --   PROT 6.5 6.9 7.7  --  5.6*  --   --   --   --   --   ALBUMIN 1.9* 2.0* 1.9*   < >  1.5*   <  > 1.9* 2.0* 2.2* 2.3*   < > = values in this interval not displayed.    Assessment and Plan: Bilateral nephrostomy tubes Right PCN output has decreased some, however appears to beworking well and remains in place without issue.  Left PCN continues with good output.  Site assessed.  It is erythematous, however no signs of pus or drainage around tube. It is not tender.  WBC has trended to WNL now at 5.7K. Afebrile SCr consistent with values over the past 1 week. Currently, 1.4. She is on daptomycin IV.  IR to follow.   Electronically Signed: Docia Barrier, PA 10/10/2017, 10:43 AM   I spent a total of 15 Minutes at the the patient's bedside AND on the patient's hospital floor or unit, greater than 50% of which was counseling/coordinating care for bilateral hydronephrosis.

## 2017-10-10 NOTE — Consult Note (Signed)
[place] Nurse wound follow up Unstageable sacral pressure injury, POA.  Is currently receiving hydrotherapy and this was just done this AM.  Patient is sleeping as she was premedicated for pain with this procedure.  Wound is not assessed this AM.  Please see PT notes.  [place] Nurse ostomy follow up Stoma type/location:  Right abdominal plane Pouch system was changed yesterday and is intact this AM.  Patient states they are using her supplies from home and she has no needs at this time. Spouse is not at bedside.  No further needs at this time.  [facility] team will follow as needed.  Domenic Moras RN BSN [place] Pager [phone]

## 2017-10-10 NOTE — Progress Notes (Signed)
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 Fluid collection w/ CT guided drain placed on 09-16-17, removed 2/2 malpositioning. Cultures + last adm for  GBS, lactobacillus, serratia and yeast on cipro PTA. Adm 10/03/17 from SNF with fevers, AMS. In ED L nephrostomy fell out w/purulent urine from site. Replaced and R tube exchanged. We were asked to see for recurrent AKI on CKD creatinine up to 3.7  (most recent creatinine PTA new baseline of around 2; baseline prior to 08/2017 admit was around 02 Jun 2017. Nadir this admission improved to 1.3   Assessment/Recommendations  1. AKI on CKD - most recent (new) baseline creatinine around 2 prior to current admission. Last AKI was early 08/2017 w/creatinine at that time up to 4.28, had bilat perc tubes placed to divert urine from bladder (colovesical fistula), + vol depletion.  1. Current AKI max creatinine 3.74 2/2 vol depletion, displaced RL perc tube (replaced) and sepsis Creatinine stable now for several days s/p replacement of perc tubes  2. Volume depletion - no IVF right now, relying on just po intake. Has HUGE stool volumes - still have concerns about recurrent volume depletion - give IVF today- total of one liter  3. Hypokalemia - Had K runs X 4 for 4 days in a row last 3/7. 1. K stable on 20 elixir daily now (not tolerant of po tabs)  4. Hypomagnesemia - s/p 3 days Mg infusion. Mg back down to 1.3 despite oral. Redosed IV 3/9.  1. Getting daily Mg levels- is stable today    5. Metabolic acidosis- Corrected. Stopped bicarb drip. Has not required po suppl  6. Urosepsis - all UC + MRSA. ID recommending dapto X 2 weeks 7. Chronic obstructive uropathy/colovesical fistula  - L PCN replaced, R exchanged 3/3; ultimate need for surgical diversion down the road  8. Anemia - with prior Fe def (unable to give IV Fe for tsat of 7 last adm due to infection; similar issues now). Transfuse prn.  9. Sacral decubitus - hydrotherapy  10. Chronic neutropenia - on chronic filagrastin   11. Encephalopathy resolved with treatment of infection, restoration of volume 12. MRSA PCR +  Disposition - main issue is her keeping up with volume losses- will re initiate IVF today- could consider intermittent IVF as OP.  I think would also need BMP and mag once weekly as OP to assess for replacement needs or change in creatinine    As renal function an elytes pretty stable and due to high consult volume renal will sign off, call with questions    Jonothan Heberle A 10/09/2017, 11:21 AM  Subjective/Interval History:  Still 2.8L ostomy output on top of 1.5 liter UOP- overall neg 2.2 liters  (Stool output may have been down a bit w/metamucil and lomotil) crt stable for several days   Objective Vital signs in last 24 hours: Vitals:   10/09/17 2332 10/10/17 0000 10/10/17 0417 10/10/17 0830  BP: 130/66  127/69 (!) 115/54  Pulse: 76  90 74  Resp: 15 13 18 17   Temp: 98.8 F (37.1 C)  98.5 F (36.9 C) 98.4 F (36.9 C)  TempSrc: Oral  Oral Oral  SpO2: 98%  98% 98%  Weight:      Height:       Weight  change:   Intake/Output Summary (Last 24 hours) at 10/10/2017 0907 Last data filed at 10/10/2017 0804 Gross per 24 hour  Intake 390 ml  Output 3045 ml  Net -2655 ml   Physical Exam:  Blood pressure (!) 115/54, pulse 74, temperature 98.4 F (36.9 C), temperature source Oral, resp. rate 17, height 5\' 2"  (1.575 m), weight 87.9 kg (193 lb 12.6 oz), SpO2 98 %.   Awake and alert L IJ 3L Lungs clear S8N4 No S3 1/6 systolic murmur no diastolic murmur Ostomy in place liquid drainage No LE edema PCN sites intact Sacral decub covered   Recent Labs  Lab 10/04/17 0220  10/05/17 0533 10/06/17 0240 10/07/17 0500 10/08/17 0500 10/09/17 0845 10/10/17 0420  NA 145 147* 137 138 138 137 134*  K 2.4* 2.8* 3.2* 4.1 4.6 4.4 4.4  CL 106 104 100* 103 101 101 101  CO2 27 29 28 27 24 25 24   GLUCOSE 83 126* 111* 110* 104* 95 111*  BUN 49* 38* 35* 29* 26* 27* 32*  CREATININE 2.78* 2.11* 1.71* 1.38* 1.30* 1.49*  1.44* 1.40*  CALCIUM 8.1* 8.1* 7.9* 8.1* 8.2* 8.5* 8.7*  PHOS 3.6 2.1* 2.8 3.1 4.0 4.1 4.6   Results for KRESTA, TEMPLEMAN (MRN [id number]) as of 10/09/2017 11:23  10/04/2017 10:41 10/05/2017 05:00 10/06/2017 02:40 10/08/2017 05:00 10/09/2017 08:45  Magnesium 1.7 1.5 (L) 1.6 (L) 1.3 (L) 2.0   Recent Labs  Lab 10/04/17 0220  10/08/17 0500 10/09/17 0845 10/10/17 0420  AST 18  --   --   --   --   ALT 19  --   --   --   --   ALKPHOS 226*  --   --   --   --   BILITOT 0.7  --   --   --   --   PROT 5.6*  --   --   --   --   ALBUMIN 1.5*   < > 2.0* 2.2* 2.3*   < > = values in this interval not displayed.    Recent Labs  Lab 10/07/17 0500 10/08/17 0500 10/09/17 0845 10/10/17 0425  WBC 16.4* 10.6* 6.5 5.7  HGB 9.4* 9.7* 9.7* 9.8*  HCT 31.3* 31.6* 32.0* 31.4*  MCV 93.4 94.6 95.2 93.7  PLT 280 270 262 243    Recent Labs  Lab 10/10/17 0425  CKTOTAL 202   Recent Labs  Lab 10/09/17 0806 10/09/17 1223 10/09/17 1618 10/09/17 2109 10/10/17 0803  GLUCAP 107* 166* 122* 118* 99    Urine cultures all + MRSA Blood cultures negative to date  Medications: . DAPTOmycin (CUBICIN)  IV Stopped (10/09/17 1627)  . sodium chloride 0.9 % 1,000 mL infusion 10 mL/hr at 10/08/17 0500   . anastrozole  1 mg Oral Daily  . artificial tears  1 application Both Eyes QHS  . collagenase   Topical Daily  . diphenoxylate-atropine  1 tablet Oral Daily  . enoxaparin (LOVENOX) injection  40 mg Subcutaneous Q24H  . fentaNYL  25 mcg Transdermal Q72H  . insulin aspart  0-9 Units Subcutaneous TID WC  . levothyroxine  150 mcg Oral QAC breakfast  . magnesium oxide  800 mg Oral  BID  . mouth rinse  15 mL Mouth Rinse BID  . multivitamin with minerals  1 tablet Oral Daily  . nystatin  5 mL Oral QID  . polyvinyl alcohol  1 drop Both Eyes QHS  . potassium chloride  20 mEq Oral Daily  . pregabalin  50 mg Oral BID  . protein supplement shake  11 oz Oral TID BM  . psyllium  1 packet Oral Daily  . sodium chloride flush  10-40 mL Intracatheter Q12H  . sodium chloride flush  3 mL Intravenous Q12H  . sodium chloride flush  5 mL Intracatheter Q8H  . Tbo-Filgrastim  480 mcg Subcutaneous 6 days

## 2017-10-10 NOTE — Progress Notes (Signed)
Pharmacy Antibiotic Note  [name] is a 62 y.o. female admitted on 10/02/2017 with AMS concerning for sepsis of possible PNA or UTI source. ID on board. Pharmacy has been consulted for Daptomycin dosing - meropenem discontinued on 3/4.  Currently on day 9 of antibiotics. Noted AoCKD (BL 1.8-2, admit 3.74) which has been improving (today Scr 1.4), estimated CrCl ~42 mL/min. WBC down to WNL. CK on 3/7 was 18>202. Patient has been requiring IVF replace 2/2 to fluid loss. Will order another CK for 48 hours and monitor trend. Found to be growing MRSA in urine cultures, with no growth on blood cultures.   Plan: 1. Continue daptomycin 500 mg (~6 mg/kg) IV every 24 hours given improvement in SCr 2. Plan per ID to transition to linezolid as outpatient for total treatment of 2 weeks 3. Will continue to follow renal function, CK while on daptomycin, culture results, and clinical picture   Height: 5\' 2"  (157.5 cm) Weight: 193 lb 12.6 oz (87.9 kg) IBW/kg (Calculated) : 50.1  Temp (24hrs), Avg:98.4 F (36.9 C), Min:97.6 F (36.4 C), Max:98.9 F (37.2 C)  Recent Labs  Lab 10/06/17 0240 10/07/17 0500 10/08/17 0500 10/09/17 0845 10/10/17 0420 10/10/17 0425  WBC 21.7* 16.4* 10.6* 6.5  --  5.7  CREATININE 1.71* 1.38* 1.30* 1.49*  1.44* 1.40*  --     Estimated Creatinine Clearance: 42.3 mL/min (A) (by C-G formula based on SCr of 1.4 mg/dL (H)).    Allergies  Allergen Reactions  . Penicillins Swelling    Facial swelling/childhood allergy Has patient had a PCN reaction causing immediate rash, facial/tongue/throat swelling, SOB or lightheadedness with hypotension: Yes Has patient had a PCN reaction causing severe rash involving mucus membranes or skin necrosis: Yes Has patient had a PCN reaction that required hospitalization yes Has patient had a PCN reaction occurring within the last 10 years: No If all of the above answers are "NO", then may proceed with Cephalosporin use.   . Adhesive  [Tape]     blisters  . Cefaclor Rash    Ceclor  . Erythromycin Other (See Comments)    Gastritis, abd cramps  . Trimethoprim Rash  . Ultram [Tramadol] Hives  . Fluconazole Rash  . Oxycodone Other (See Comments)    " I just feel weird"  . Pectin Rash    Pectin ring for stoma  . Sulfa Antibiotics Rash   Antimicrobials this admission: Dapto 3/3 >> Mero 3/3 >>3/4  Dose adjustments this admission: 3/7: Daptomycin changed from q48 to q24 with improvement in renal function  Microbiology results: 3/3 BCx >> NGTD 3/3 UCx >>MRSA 3/3 UCx from R kidney>> MRSA 3/3 RCx >> sent  Thank you for allowing pharmacy to be a part of this patient's care.  Georga Bora, PharmD Clinical Pharmacist 10/10/2017 10:10 AM

## 2017-10-11 LAB — GLUCOSE, CAPILLARY
GLUCOSE-CAPILLARY: 129 mg/dL — AB (ref 65–99)
GLUCOSE-CAPILLARY: 115 mg/dL — AB (ref 65–99)
GLUCOSE-CAPILLARY: 175 mg/dL — AB (ref 65–99)
Glucose-Capillary: 110 mg/dL — ABNORMAL HIGH (ref 65–99)

## 2017-10-11 LAB — RENAL FUNCTION PANEL
ALBUMIN: 2.3 g/dL — AB (ref 3.5–5.0)
ANION GAP: 11 (ref 5–15)
BUN: 33 mg/dL — AB (ref 6–20)
CO2: 21 mmol/L — AB (ref 22–32)
CREATININE: 1.53 mg/dL — AB (ref 0.44–1.00)
Calcium: 8.8 mg/dL — ABNORMAL LOW (ref 8.9–10.3)
Chloride: 103 mmol/L (ref 101–111)
GFR calc Af Amer: 41 mL/min — ABNORMAL LOW (ref >60)
GFR calc non Af Amer: 35 mL/min — ABNORMAL LOW (ref >60)
GLUCOSE: 106 mg/dL — AB (ref 65–99)
PHOSPHORUS: 4.4 mg/dL (ref 2.5–4.6)
Potassium: 4.4 mmol/L (ref 3.5–5.1)
SODIUM: 135 mmol/L (ref 135–145)

## 2017-10-11 LAB — CBC
HCT: 30.6 % — ABNORMAL LOW (ref 36.0–46.0)
Hemoglobin: 9.1 g/dL — ABNORMAL LOW (ref 12.0–15.0)
MCH: 28.2 pg (ref 26.0–34.0)
MCHC: 29.7 g/dL — AB (ref 30.0–36.0)
MCV: 94.7 fL (ref 78.0–100.0)
Platelets: 238 10*3/uL (ref 150–400)
RBC: 3.23 MIL/uL — AB (ref 3.87–5.11)
RDW: 16.2 % — ABNORMAL HIGH (ref 11.5–15.5)
WBC: 61.9 10*3/uL (ref 4.0–10.5)

## 2017-10-11 LAB — MAGNESIUM: Magnesium: 1.4 mg/dL — ABNORMAL LOW (ref 1.7–2.4)

## 2017-10-11 MED ORDER — MAGNESIUM SULFATE 4 GM/100ML IV SOLN
4.0000 g | Freq: Once | INTRAVENOUS | Status: AC
  Administered 2017-10-11: 4 g via INTRAVENOUS
  Filled 2017-10-11: qty 100

## 2017-10-11 NOTE — Progress Notes (Signed)
Physical Therapy Wound Treatment Patient Details  Name: [name] MRN: [id number] Date of Birth: [birth year]  Today's Date: 10/11/2017 Time: 1050-1150 Time Calculation (min): 60 min  Subjective  Subjective: Impatient with the slow rate of her wound healing  Patient and Family Stated Goals: to heal   Pain Score:  Despite premedication prior to treatment pt with 8/10 pain on Facial Scale with treatment.   Wound Assessment  Pressure Injury 10/02/17 Unstageable - Full thickness tissue loss in which the base of the ulcer is covered by slough (yellow, tan, gray, green or brown) and/or eschar (tan, brown or black) in the wound bed. Sacrum 9 x12  open with yellow wound base puru (Active)  Wound Image   10/05/2017  6:00 PM  Dressing Type Barrier Film (skin prep);Gauze (Comment);Moist to dry;Foam 10/11/2017 12:00 PM  Dressing Changed;Clean;Dry;Intact 10/11/2017 12:00 PM  Dressing Change Frequency Daily 10/11/2017 12:00 PM  State of Healing Early/partial granulation 10/11/2017 12:00 PM  Site / Wound Assessment Pink;Yellow;Painful 10/11/2017 12:00 PM  % Wound base Red or Granulating 65% 10/11/2017 12:00 PM  % Wound base Yellow/Fibrinous Exudate 35% 10/11/2017 12:00 PM  Peri-wound Assessment Erythema (blanchable);Pink 10/11/2017 12:00 PM  Wound Length (cm) 9 cm 10/04/2017 12:00 PM  Wound Width (cm) 12 cm 10/04/2017 12:00 PM  Wound Surface Area (cm^2) 108 cm^2 10/04/2017 12:00 PM  Margins Unattached edges (unapproximated) 10/11/2017 12:00 PM  Drainage Amount Minimal 10/11/2017 12:00 PM  Drainage Description Purulent;Serosanguineous 10/11/2017 12:00 PM  Treatment Cleansed;Debridement (Selective);Hydrotherapy (Pulse lavage);Packing (Saline gauze) 10/11/2017 12:00 PM   Santyl applied to wound bed.               Hydrotherapy Pulsed lavage therapy - wound location: Sacrum, hip Pulsed Lavage with Suction (psi): 4 psi(due to pain) Pulsed Lavage with Suction - Normal Saline Used: 1000 mL Pulsed Lavage Tip: Tip with  splash shield Selective Debridement Selective Debridement - Location: sacrum, hip Selective Debridement - Tools Used: Scalpel;Forceps Selective Debridement - Tissue Removed: necrotic, yellow slough   Wound Assessment and Plan  Wound Therapy - Assess/Plan/Recommendations Wound Therapy - Clinical Statement: Wound is located in fold between buttocks and folds over on itself with movement after dressing changed. Utilized 4x4 gauze to improve approximation of dressing to wound to aid in healing. Slough still closely adhereed despite crosshatching and santyl application in addition to daily hydrotherapy. Will continue to follow while in hospital.  Wound Therapy - Functional Problem List: Decreased sitting tolerance Factors Delaying/Impairing Wound Healing: Diabetes Mellitus;Immobility Hydrotherapy Plan: Debridement;Dressing change;Patient/family education;Pulsatile lavage with suction Wound Therapy - Frequency: 6X / week Wound Therapy - Follow Up Recommendations: Skilled nursing facility Wound Plan: see above  Wound Therapy Goals- Improve the function of patient's integumentary system by progressing the wound(s) through the phases of wound healing (inflammation - proliferation - remodeling) by: Decrease Necrotic Tissue to: 10% Decrease Necrotic Tissue - Progress: Progressing toward goal Increase Granulation Tissue to: 90% Increase Granulation Tissue - Progress: Progressing toward goal Goals/treatment plan/discharge plan were made with and agreed upon by patient/family: Yes Time For Goal Achievement: 7 days Wound Therapy - Potential for Goals: Good  Goals will be updated until maximal potential achieved or discharge criteria met.  Discharge criteria: when goals achieved, discharge from hospital, MD decision/surgical intervention, no progress towards goals, refusal/missing three consecutive treatments without notification or medical reason.  GP    Dani Gobble. Migdalia Dk PT, DPT Acute  Rehabilitation  [phone] Pager [phone]   [place] 10/11/2017, 12:59 PM

## 2017-10-11 NOTE — Progress Notes (Addendum)
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 Pt dc'd from [facility] on 2/24 after >1 month hospitalization. Pt readmitted to [facility] on 10/02/2017 with pain, delirium and acute renal failure; found to have displacement of lt nephrostomy tube and exchange of rt nephrostomy tube.    PT Comments    Pt progressing with mobility. Able to transfer and amb with RW and supervision for safety; intermittent cues for improved gait mechanics. Pt easily fatigued requiring multiple standing rest breaks throughout. Increased time spent discussing home set-up and pt's ability to navigate this with use of her rollator. Pt does continue to demonstrate decreased activity tolerance, generalized weakness, and is at increased risk for falls and would certainly benefit from continued rehab at short-term SNF. If pt is to have 24/7 support available at home and continues to improve, recommend HHPT services. Pt anxious regarding current decision, and wants to wait to discuss HHPT vs. SNF recommendation with husband. Will plan to check in with family tomorrow.   Follow Up Recommendations  SNF;Supervision/Assistance - 24 hour     Equipment Recommendations  None recommended by PT    Recommendations for Other Services       Precautions / Restrictions Precautions Precautions: Fall;Other (comment) Precaution Comments: bilateral nephrostomy tubes, colostomy, sacral wound Restrictions Weight Bearing Restrictions: No    Mobility  Bed Mobility Overal bed mobility: Needs  Assistance Bed Mobility: Supine to Sit;Sit to Supine     Supine to sit: Supervision Sit to supine: Supervision   General bed mobility comments: Increased time and effort to organize drains/tubes with bed mobility, but no physical assist required  Transfers Overall transfer level: Needs assistance Equipment used: Rolling walker (2 wheeled) Transfers: Sit to/from Stand Sit to Stand: Supervision         General transfer comment: Continues to pull on RW with BUEs requiring cues to correct  Ambulation/Gait Ambulation/Gait assistance: Supervision Ambulation Distance (Feet): 150 Feet Assistive device: Rolling walker (2 wheeled) Gait Pattern/deviations: Step-through pattern;Decreased stride length;Trunk flexed Gait velocity: Decreased Gait velocity interpretation: <1.8 ft/sec, indicative of risk for recurrent falls General Gait Details: Slow, controlled amb with RW and supervision for safety, requiring 3x standing rest breaks secondary to fatigue. Intermittent cues to maintain upright posture as pt tends to hunch over [employer] Mobility    Modified Rankin (Stroke Patients Only)       Balance Overall balance assessment: Needs assistance Sitting-balance support: Feet supported Sitting balance-Leahy Scale: Fair Sitting balance - Comments: decreased sitting tolerance due to sacral wound   Standing balance support: Bilateral upper extremity supported Standing balance-Leahy Scale: Poor Standing balance comment: Reliant on UE support                            Cognition Arousal/Alertness: Awake/alert Behavior During Therapy: Anxious Overall Cognitive Status: Impaired/Different from baseline Area of Impairment: Awareness;Problem solving;Memory                     Memory: Decreased short-term memory  Awareness: Emergent Problem Solving: Requires verbal cues General Comments: Pt with increased anxiety (seeming overwhelmed  in general) regarding decision to d/c to SNF or return home with [facility] services. Has no recollection of working with PT/OT for mobility last week      Exercises      General Comments        Pertinent Vitals/Pain Pain Assessment: Faces Faces Pain Scale: Hurts little more Pain Location: R posterior drain incision site Pain Descriptors / Indicators: Grimacing;Guarding Pain Intervention(s): Monitored during session    Home Living Family/patient expects to be discharged to:: Private residence                    Prior Function            PT Goals (current goals can now be found in the care plan section) Acute Rehab PT Goals Patient Stated Goal: To feel better PT Goal Formulation: With patient/family Time For Goal Achievement: 10/20/17 Potential to Achieve Goals: Good Progress towards PT goals: Progressing toward goals    Frequency    Min 3X/week      PT Plan Frequency needs to be updated;Current plan remains appropriate    Co-evaluation              AM-PAC PT "6 Clicks" Daily Activity  Outcome Measure  Difficulty turning over in bed (including adjusting bedclothes, sheets and blankets)?: None Difficulty moving from lying on back to sitting on the side of the bed? : None Difficulty sitting down on and standing up from a chair with arms (e.g., wheelchair, bedside commode, etc,.)?: A Little Help needed moving to and from a bed to chair (including a wheelchair)?: A Little Help needed walking in hospital room?: A Little Help needed climbing 3-5 steps with a railing? : A Little 6 Click Score: 20    End of Session Equipment Utilized During Treatment: (no gait belt due to multiple tube sites) Activity Tolerance: Patient tolerated treatment well;Patient limited by fatigue Patient left: in bed;with call bell/phone within reach Nurse Communication: Mobility status PT Visit Diagnosis: Other abnormalities of gait and mobility (R26.89);Muscle weakness (generalized)  (M62.81);Difficulty in walking, not elsewhere classified (R26.2) Pain - part of body: (sacrum)     Time: 6659-9357 PT Time Calculation (min) (ACUTE ONLY): 24 min  Charges:  $Gait Training: 8-22 mins $Self Care/Home Management: 8-22                    G Codes:      Mabeline Caras, PT, DPT Acute Rehab Services  Pager: [place] 10/11/2017, 5:43 PM

## 2017-10-11 NOTE — Care Management Note (Signed)
Case Management Note  Patient Details  Name: [name] MRN: [id number] Date of Birth: 1956/01/06  Subjective/Objective:     Pt admitted with  AMS and AKI          Action/Plan:   PTA from [facility] - pt admitted to [facility] with ostomy and 2 nephrostomy tubes.  CSW following for placement.     Expected Discharge Date:                  Expected Discharge Plan:  Skilled Nursing Facility(From [facility])  In-House Referral:  Clinical Social Work  Discharge planning Services  CM Consult  Post Acute Care Choice:    Choice offered to:     DME Arranged:    DME Agency:     HH Arranged:    Duck Agency:     Status of Service:     If discussed at [employer], dates discussed:    Additional Comments: 10/11/2017 Pt continues to require IV Mag replacement and IV fluids.  Pt is also actively receiving hydrotherapy based on daily assessments [name], RN 10/11/2017, 2:17 PM

## 2017-10-11 NOTE — Progress Notes (Signed)
Nutrition Follow-up  DOCUMENTATION CODES:   Obesity unspecified  INTERVENTION:   -Continue MVI daily -Continue Premier Protein TID, each supplement provides 160 kcals and 30 grams protein  NUTRITION DIAGNOSIS:   Increased nutrient needs related to wound healing as evidenced by estimated needs.  Ongoing  GOAL:   Patient will meet greater than or equal to 90% of their needs  Progressing  MONITOR:   PO intake, Supplement acceptance, Labs, Weight trends, Skin, I & O's, Diet advancement  REASON FOR ASSESSMENT:   Consult Poor PO  ASSESSMENT:   62 y.o. female  with past medical history of endometrial cancer and complicated surgical history including hysterectomy, chronic stent placement, colostomy reversal with ureteral construction and subsequent bladder leak with colovesicular fistula who had bilateral nephrostomy tubes placed in February admitted on 10/02/2017 with confusion and lethargy likely subsequent from sepsis related to UTI.   3/3- s/p bilateral PCN replacement and exchange by IR 3/5- s/p BSE- pt much more alert; plan to continue with full liquid diet with plans for advancement 3/6- hydrotherapy initiaited  Reviewed I/O's: -2.1 L x 24 hours and -7.5 L since admission.   Pt receiving nursing care at time of visit. Noted intake continues to increase. Noted meal completion 100%. Also taking Premier Protein supplements.   Labs reviewed: CBGS: 110-141 (inpatient orders fr glycemic control are 0-9 units insulin aspart TID with meals).   Diet Order:  Diet regular Room service appropriate? Yes; Fluid consistency: Thin  EDUCATION NEEDS:   Not appropriate for education at this time  Skin:  Skin Assessment: Skin Integrity Issues: Skin Integrity Issues:: Stage I, Unstageable Stage I: lt heel Stage II: n/a Unstageable: sacrum x 3  Last BM:  10/11/17 (250 ml output via ileostomy)  Height:   Ht Readings from Last 1 Encounters:  10/06/17 5\' 2"  (1.575 m)    Weight:    Wt Readings from Last 1 Encounters:  10/11/17 196 lb 3.4 oz (89 kg)    Ideal Body Weight:  50 kg  BMI:  Body mass index is 35.89 kg/m.  Estimated Nutritional Needs:   Kcal:  3664-4034  Protein:  100-115 grams  Fluid:  > 1.7 L    [name] A. Jimmye Norman, RD, LDN, CDE Pager: [phone] After hours Pager: [phone]

## 2017-10-11 NOTE — Progress Notes (Signed)
PROGRESS NOTE    [name]  INO:[id number] DOB: Mar 21, 1956 DOA: 10/02/2017 PCP: Ann Held, DO   Brief Narrative:  62 year old female with history of endometrial cancer, ovarian cancer, diabetes, hypothyroidism, hypertension, chronic kidney disease who presented with worsening delirium and renal function.  Found to have ureteral stent infection with MRSA, placed on daptomycin.  Infectious disease consulted and appreciated, recommending discharge with [employer]king nasal lid twice daily for 2 weeks.  Nephrology also consulted and following.  Neurology consulted and feels patient would need urinary diversion procedure however will be sometime in the future.  Currently pending SNF placement.  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 -Patient had replacement of left PCN-was noted to have purulent urine from exit site. -Right percutaneous tube was exchanged on 10/02/2017 -Creatinine currently 1.53 -Nephrology consulted and appreciated  Sepsis secondary to MRSA UTI/Stent infection -patient was tachycardic with tachypnea on the day of admission -Patient also found to have neutropenia however that is chronic, currently WBCs are up to 61.9 (however, patient received granix) -UA showed many bacteria, TNTC WBC, large leukocytes -Blood cultures no growth to date -Urine culture shows MRSA -as noted above, with replacement of left PCN, purulent urine was noted -Infectious disease consulted and appreciated, currently on daptomycin.  Recommended use of linezolid for extended 2 weeks at discharge  Hypomagnesemia -currently on oral magnesium supplementation  -magnesium 1.4, giving 4g  IV mag -have asked social work if patient could receive IV magnesium at the SNF periodically, of course with the PCP monitoring her labs -suspect may be adding to patient's high output -continue to monitor   Hypokalemia -resolved, continue to monitor BMP  Metabolic acidosis -Please secondary to AKI, patient did require bicarbonate drip which was discontinued  Anemia secondary to chronic disease/iron deficiency -Hemoglobin dropped to 7.3, received transfusion -Hemglobin stable, currently 9.1 -Continue to monitor CBC  Sacral decubitus ulcer -PT currently advising hydrotherapy  Encephalopathy, metabolic -Resolved, suspect likely secondary to infection and acute kidney injury  Metastatic endometrial cancer/chronic neutropenia -Patient takes filgastrin at home -Continue Arimidex  Left axillary lymph node -discussed with oncology, Dr. Alvy Bimler- recommended US -obtained US: suspicious hypoechoic irregular mass in the region of palpable concern in the left chest wall. Recurrent breast cancer cannot be excluded.  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  Patient did have this done at home with home health in the past. Pending response  Hypothyroidism -Continue Synthroid  Diabetes mellitus, type II -Continue insulin sliding scale, CBG monitoring-CBGs have been stable -lantus currently held due to poor oral intake -have  placed patient on regular diet (have told husband and RN that we will liberalize her diet to aid in her nutritional status  Protein calorie malnutrition, moderate -Patient consulted, continue supplements -Patient has had better intake over the past few days -see discussion above  Goals of care/pain control -palliative care consulted and appreciated, continues to be full code  DVT Prophylaxis  lovenox  Code Status: Full  Family Communication: None at bedside  Disposition Plan: Admitted, continue to monitor in stepdown. Pending SNF when medically stable.   Consultants Infectious disease Nephrology Interventional radiology Urology Palliative care Oncology, Dr. Alvy Bimler, via phone General surgery, via phone  Procedures  Left PCN replacement and exchange of right PCN interventional radiology LUE Soft Tissue Ultrasound  Antibiotics   Anti-infectives (From admission, onward)   Start     Dose/Rate Route Frequency Ordered Stop   10/06/17 1600  DAPTOmycin (CUBICIN) 500 mg in sodium chloride 0.9 % IVPB     500 mg 220 mL/hr over 30 Minutes Intravenous Every 24 hours 10/06/17 1041     10/04/17 1200  vancomycin (VANCOCIN) IVPB 1000 mg/200 mL premix  Status:  Discontinued     1,000 mg 200 mL/hr over 60 Minutes Intravenous Every 48 hours 10/02/17 0903 10/02/17 1539   10/04/17 0800  levofloxacin (LEVAQUIN) IVPB 500 mg  Status:  Discontinued     500 mg 100 mL/hr over 60 Minutes Intravenous Every 48 hours 10/02/17 0903 10/02/17 1539   10/03/17 1600  DAPTOmycin (CUBICIN) 500 mg in sodium chloride 0.9 % IVPB  Status:  Discontinued     500 mg 220 mL/hr over 30 Minutes Intravenous Every 48 hours 10/02/17 1603 10/06/17 1041   10/02/17 2000  aztreonam (AZACTAM) 500 mg in dextrose 5 % 50 mL IVPB  Status:  Discontinued     500 mg 100 mL/hr over 30 Minutes Intravenous Every 8 hours 10/02/17 0903 10/02/17 1539   10/02/17 2000  meropenem (MERREM) 500 mg in sodium chloride 0.9 % 100 mL IVPB   Status:  Discontinued     500 mg 200 mL/hr over 30 Minutes Intravenous Every 12 hours 10/02/17 1603 10/04/17 0906   10/02/17 0915  aztreonam (AZACTAM) 2 g in sodium chloride 0.9 % 100 mL IVPB     2 g 200 mL/hr over 30 Minutes Intravenous  Once 10/02/17 0902 10/02/17 1007   10/02/17 0915  vancomycin (VANCOCIN) 1,500 mg in sodium chloride 0.9 % 500 mL IVPB     1,500 mg 250 mL/hr over 120 Minutes Intravenous  Once 10/02/17 0902 10/02/17 1243   10/02/17 0730  levofloxacin (LEVAQUIN) IVPB 750 mg     750 mg 100 mL/hr over 90 Minutes Intravenous  Once 10/02/17 0723 10/02/17 0915   10/02/17 0730  aztreonam (AZACTAM) 2 g in sodium chloride 0.9 % 100 mL IVPB  Status:  Discontinued     2 g 200 mL/hr over 30 Minutes Intravenous  Once 10/02/17 0723 10/02/17 0902   10/02/17 0730  vancomycin (VANCOCIN) IVPB 1000 mg/200 mL premix  Status:  Discontinued     1,000 mg 200 mL/hr over 60 Minutes Intravenous  Once 10/02/17 0723 10/02/17 0902      Subjective:   [name] seen and examined today.  Patient states she is feeling better but worried about her ultrasound results. Feels her output has decreased. Denies current chest pain, shortness of breath, abdominal pain, N/V, dizziness, headache.   Objective:   Vitals:   10/10/17 2011 10/10/17 2329 10/11/17 0404 10/11/17 0825  BP: (!) 106/49 117/68 126/69 117/61  Pulse: 83 98 95 95  Resp: 15 18 16  (!) 24  Temp: 98.9 F (37.2 C) 98  F (36.7 C) 98.4 F (36.9 C) 98.6 F (37 C)  TempSrc: Oral Oral Oral Oral  SpO2: 100% 98% 98% 98%  Weight:   89 kg (196 lb 3.4 oz)   Height:        Intake/Output Summary (Last 24 hours) at 10/11/2017 1210 Last data filed at 10/11/2017 1059 Gross per 24 hour  Intake 1385 ml  Output 3100 ml  Net -1715 ml   Filed Weights   10/08/17 0625 10/09/17 0650 10/11/17 0404  Weight: 89.8 kg (198 lb) 87.9 kg (193 lb 12.6 oz) 89 kg (196 lb 3.4 oz)   Exam  General: Well developed, well nourished, NAD, appears stated  age  30: NCAT, mucous membranes moist.   Neck: Supple  Cardiovascular: S1 S2 auscultated, 1/6SEM, RRR  Respiratory: Clear to auscultation bilaterally with equal chest rise  Abdomen: Soft, nontender, nondistended, + bowel sounds, +ostomy  Extremities: warm dry without cyanosis clubbing or edema  Neuro: AAOx3, nonfocal  Psych: anxious but appropriate, pleasant   Data Reviewed: I have personally reviewed following labs and imaging studies  CBC: Recent Labs  Lab 10/07/17 0500 10/08/17 0500 10/09/17 0845 10/10/17 0425 10/11/17 0550  WBC 16.4* 10.6* 6.5 5.7 61.9*  HGB 9.4* 9.7* 9.7* 9.8* 9.1*  HCT 31.3* 31.6* 32.0* 31.4* 30.6*  MCV 93.4 94.6 95.2 93.7 94.7  PLT 280 270 262 243 213   Basic Metabolic Panel: Recent Labs  Lab 10/06/17 0240 10/07/17 0500 10/08/17 0500 10/09/17 0845 10/10/17 0420 10/10/17 0425 10/11/17 0426  NA 137 138 138 137 134*  --  135  K 3.2* 4.1 4.6 4.4 4.4  --  4.4  CL 100* 103 101 101 101  --  103  CO2 28 27 24 25 24   --  21*  GLUCOSE 111* 110* 104* 95 111*  --  106*  BUN 35* 29* 26* 27* 32*  --  33*  CREATININE 1.71* 1.38* 1.30* 1.49*  1.44* 1.40*  --  1.53*  CALCIUM 7.9* 8.1* 8.2* 8.5* 8.7*  --  8.8*  MG 1.6*  --  1.3* 2.0  --  1.4* 1.4*  PHOS 2.8 3.1 4.0 4.1 4.6  --  4.4   GFR: Estimated Creatinine Clearance: 39 mL/min (A) (by C-G formula based on SCr of 1.53 mg/dL (H)). Liver Function Tests: Recent Labs  Lab 10/07/17 0500 10/08/17 0500 10/09/17 0845 10/10/17 0420 10/11/17 0426  ALBUMIN 1.9* 2.0* 2.2* 2.3* 2.3*   No results for input(s): LIPASE, AMYLASE in the last 168 hours. No results for input(s): AMMONIA in the last 168 hours. Coagulation Profile: No results for input(s): INR, PROTIME in the last 168 hours. Cardiac Enzymes: Recent Labs  Lab 10/10/17 0425  CKTOTAL 202   BNP (last 3 results) No results for input(s): PROBNP in the last 8760 hours. HbA1C: No results for input(s): HGBA1C in the last 72  hours. CBG: Recent Labs  Lab 10/10/17 0803 10/10/17 1206 10/10/17 1641 10/10/17 2108 10/11/17 0824  GLUCAP 99 144* 108* 141* 110*   Lipid Profile: No results for input(s): CHOL, HDL, LDLCALC, TRIG, CHOLHDL, LDLDIRECT in the last 72 hours. Thyroid Function Tests: No results for input(s): TSH, T4TOTAL, FREET4, T3FREE, THYROIDAB in the last 72 hours. Anemia Panel: No results for input(s): VITAMINB12, FOLATE, FERRITIN, TIBC, IRON, RETICCTPCT in the last 72 hours. Urine analysis:    Component Value Date/Time   COLORURINE YELLOW 10/02/2017 0836   APPEARANCEUR CLOUDY (A) 10/02/2017 0836   LABSPEC 1.025 10/02/2017 0836   LABSPEC 1.015 11/17/2015 1436  PHURINE 5.5 10/02/2017 0836   GLUCOSEU NEGATIVE 10/02/2017 0836   GLUCOSEU 2,000 11/17/2015 1436   HGBUR MODERATE (A) 10/02/2017 0836   BILIRUBINUR NEGATIVE 10/02/2017 0836   BILIRUBINUR neg 08/30/2016 1025   BILIRUBINUR Negative 11/17/2015 1436   KETONESUR 15 (A) 10/02/2017 0836   PROTEINUR >300 (A) 10/02/2017 0836   UROBILINOGEN negative 08/30/2016 1025   UROBILINOGEN 0.2 11/17/2015 1436   NITRITE NEGATIVE 10/02/2017 0836   LEUKOCYTESUR LARGE (A) 10/02/2017 0836   LEUKOCYTESUR Moderate 11/17/2015 1436   Sepsis Labs: @LABRCNTIP (procalcitonin:4,lacticidven:4)  ) Recent Results (from the past 240 hour(s))  Blood culture (routine x 2)     Status: None   Collection Time: 10/02/17  7:00 AM  Result Value Ref Range Status   Specimen Description BLOOD LEFT ANTECUBITAL  Final   Special Requests IN PEDIATRIC BOTTLE Blood Culture adequate volume  Final   Culture   Final    NO GROWTH 5 DAYS Performed at [facility], [place] [address], [place], [place] [address]    Report Status 10/07/2017 FINAL  Final  Blood culture (routine x 2)     Status: None   Collection Time: 10/02/17  7:25 AM  Result Value Ref Range Status   Specimen Description BLOOD LEFT HAND  Final   Special Requests IN PEDIATRIC BOTTLE Blood Culture adequate volume   Final   Culture   Final    NO GROWTH 5 DAYS Performed at [facility], [place] [address] [place], [place] [address]    Report Status 10/07/2017 FINAL  Final  Urine culture     Status: Abnormal   Collection Time: 10/02/17  8:39 AM  Result Value Ref Range Status   Specimen Description URINE, RANDOM  Final   Special Requests   Final    NONE Performed at [facility], [place] [address], [place], [place] [address]    Culture MULTIPLE SPECIES PRESENT, SUGGEST RECOLLECTION (A)  Final   Report Status 10/04/2017 FINAL  Final  Urine culture     Status: Abnormal   Collection Time: 10/02/17  2:20 PM  Result Value Ref Range Status   Specimen Description URINE, CATHETERIZED  Final   Special Requests   Final    NONE Performed at [facility], [place] [address], [place], [place] [address]    Culture (A)  Final    20,000 COLONIES/mL METHICILLIN RESISTANT STAPHYLOCOCCUS AUREUS   Report Status 10/04/2017 FINAL  Final   Organism ID, Bacteria METHICILLIN RESISTANT STAPHYLOCOCCUS AUREUS (A)  Final      Susceptibility   Methicillin resistant staphylococcus aureus - MIC*    CIPROFLOXACIN >=8 RESISTANT Resistant     GENTAMICIN <=0.5 SENSITIVE Sensitive     NITROFURANTOIN <=16 SENSITIVE Sensitive     OXACILLIN >=4 RESISTANT Resistant     TETRACYCLINE <=1 SENSITIVE Sensitive     VANCOMYCIN <=0.5 SENSITIVE Sensitive     TRIMETH/SULFA >=320 RESISTANT Resistant     CLINDAMYCIN <=0.25 SENSITIVE Sensitive     RIFAMPIN <=0.5 SENSITIVE Sensitive     Inducible Clindamycin NEGATIVE Sensitive     * 20,000 COLONIES/mL METHICILLIN RESISTANT STAPHYLOCOCCUS AUREUS  Urine Culture     Status: Abnormal   Collection Time: 10/02/17  6:31 PM  Result Value Ref Range Status   Specimen Description KIDNEY RIGHT  Final   Special Requests   Final    NONE Performed at [facility], [place] [address], [place], [place] [address]    Culture (A)  Final  50,000 COLONIES/mL METHICILLIN RESISTANT STAPHYLOCOCCUS  AUREUS   Report Status 10/04/2017 FINAL  Final   Organism ID, Bacteria METHICILLIN RESISTANT STAPHYLOCOCCUS AUREUS (A)  Final      Susceptibility   Methicillin resistant staphylococcus aureus - MIC*    CIPROFLOXACIN >=8 RESISTANT Resistant     ERYTHROMYCIN >=8 RESISTANT Resistant     GENTAMICIN <=0.5 SENSITIVE Sensitive     OXACILLIN >=4 RESISTANT Resistant     TETRACYCLINE <=1 SENSITIVE Sensitive     VANCOMYCIN 1 SENSITIVE Sensitive     TRIMETH/SULFA >=320 RESISTANT Resistant     CLINDAMYCIN <=0.25 SENSITIVE Sensitive     RIFAMPIN <=0.5 SENSITIVE Sensitive     Inducible Clindamycin NEGATIVE Sensitive     * 50,000 COLONIES/mL METHICILLIN RESISTANT STAPHYLOCOCCUS AUREUS  MRSA PCR Screening     Status: Abnormal   Collection Time: 10/02/17  6:54 PM  Result Value Ref Range Status   MRSA by PCR POSITIVE (A) NEGATIVE Final    Comment:        The GeneXpert MRSA Assay (FDA approved for NASAL specimens only), is one component of a comprehensive MRSA colonization surveillance program. It is not intended to diagnose MRSA infection nor to guide or monitor treatment for MRSA infections. RESULT CALLED TO, READ BACK BY AND VERIFIED WITH: TDECAREAUX,RN @2050  10/02/17 BY LHOWARD Performed at [facility], [place] [address], [place], [place] [address]   Urine culture     Status: Abnormal   Collection Time: 10/02/17  7:35 PM  Result Value Ref Range Status   Specimen Description KIDNEY RIGHT  Final   Special Requests   Final    Normal Performed at [facility], [place] [address], [place], [place] [address]    Culture (A)  Final    >=100,000 COLONIES/mL METHICILLIN RESISTANT STAPHYLOCOCCUS AUREUS   Report Status 10/04/2017 FINAL  Final   Organism ID, Bacteria METHICILLIN RESISTANT STAPHYLOCOCCUS AUREUS (A)  Final      Susceptibility   Methicillin resistant staphylococcus aureus - MIC*    CIPROFLOXACIN >=8 RESISTANT Resistant     ERYTHROMYCIN >=8 RESISTANT Resistant     GENTAMICIN  <=0.5 SENSITIVE Sensitive     OXACILLIN >=4 RESISTANT Resistant     TETRACYCLINE <=1 SENSITIVE Sensitive     VANCOMYCIN 1 SENSITIVE Sensitive     TRIMETH/SULFA >=320 RESISTANT Resistant     CLINDAMYCIN <=0.25 SENSITIVE Sensitive     RIFAMPIN <=0.5 SENSITIVE Sensitive     Inducible Clindamycin NEGATIVE Sensitive     * >=100,000 COLONIES/mL METHICILLIN RESISTANT STAPHYLOCOCCUS AUREUS      Radiology Studies: [place] Lt Upper Extrem Ltd Soft Tissue Non Vascular  Result Date: 10/11/2017 CLINICAL DATA:  62 year old patient with history of left breast cancer diagnosed in 2017. She has a history of bilateral mastectomies in May of 2017. Currently, she is an inpatient at [facility] and was evaluated with ultrasound because of a palpable mass in the lateral left chest wall inferior to the axilla. I was not present for this ultrasound examination and have not examined the patient myself. EXAM: ULTRASOUND LEFT UPPER EXTREMITY LIMITED TECHNIQUE: Ultrasound examination of the upper extremity soft tissues was performed in the area of clinical concern. COMPARISON:  Prior ultrasound-guided left breast biopsy in 2017. FINDINGS: Images labeled left chest lateral inferior to axilla in the palpable region show an irregular hypoechoic mass with internal vascularity. The mass measures approximately 0.6 x 0.7 x 0.9 cm, and has hypoechoic spicules extending peripheral to the dominant portion of the  mass. IMPRESSION: Suspicious hypoechoic irregular mass in the region of palpable concern in the left chest wall in this patient status post mastectomy for history of breast cancer. Recurrent breast cancer cannot be excluded. Consider surgical consultation. Alternatively, when the patient is medically stable, she can have evaluation and possible biopsy at the [place] [facility]. These results will be called to the ordering clinician or representative by the Radiologist Assistant, and communication documented in  the PACS or zVision Dashboard. Electronically Signed   By: Curlene Dolphin M.D.   On: 10/11/2017 11:29     Scheduled Meds: . anastrozole  1 mg Oral Daily  . artificial tears  1 application Both Eyes QHS  . collagenase   Topical Daily  . diphenoxylate-atropine  1 tablet Oral Daily  . enoxaparin (LOVENOX) injection  40 mg Subcutaneous Q24H  . fentaNYL  25 mcg Transdermal Q72H  . insulin aspart  0-9 Units Subcutaneous TID WC  . levothyroxine  150 mcg Oral QAC breakfast  . magnesium oxide  800 mg Oral BID  . mouth rinse  15 mL Mouth Rinse BID  . multivitamin with minerals  1 tablet Oral Daily  . nystatin  5 mL Oral QID  . polyvinyl alcohol  1 drop Both Eyes QHS  . potassium chloride  20 mEq Oral Daily  . pregabalin  50 mg Oral BID  . protein supplement shake  11 oz Oral TID BM  . psyllium  1 packet Oral Daily  . sodium chloride flush  10-40 mL Intracatheter Q12H  . sodium chloride flush  3 mL Intravenous Q12H  . sodium chloride flush  5 mL Intracatheter Q8H  . Tbo-Filgrastim  480 mcg Subcutaneous 6 days   Continuous Infusions: . DAPTOmycin (CUBICIN)  IV Stopped (10/10/17 1712)  . sodium chloride 0.9 % 1,000 mL infusion 10 mL/hr at 10/08/17 0500     LOS: 9 days   Time Spent in minutes   45 minutes  Trevor Duty D.O. on 10/11/2017 at 12:10 PM  Between 7am to 7pm - Pager - [phone]  After 7pm go to [email or web address] - password [email or web address]  And look for the night coverage person covering for me after hours  [facility] Office  [phone]

## 2017-10-12 ENCOUNTER — Inpatient Hospital Stay (HOSPITAL_COMMUNITY): Payer: BLUE CROSS/BLUE SHIELD

## 2017-10-12 DIAGNOSIS — N179 Acute kidney failure, unspecified: Secondary | ICD-10-CM

## 2017-10-12 DIAGNOSIS — N183 Chronic kidney disease, stage 3 (moderate): Secondary | ICD-10-CM

## 2017-10-12 LAB — CBC
HCT: 30.1 % — ABNORMAL LOW (ref 36.0–46.0)
Hemoglobin: 9.4 g/dL — ABNORMAL LOW (ref 12.0–15.0)
MCH: 29.7 pg (ref 26.0–34.0)
MCHC: 31.2 g/dL (ref 30.0–36.0)
MCV: 95 fL (ref 78.0–100.0)
Platelets: 238 10*3/uL (ref 150–400)
RBC: 3.17 MIL/uL — AB (ref 3.87–5.11)
RDW: 16.8 % — AB (ref 11.5–15.5)
WBC: 15.6 10*3/uL — AB (ref 4.0–10.5)

## 2017-10-12 LAB — RENAL FUNCTION PANEL
ALBUMIN: 2.5 g/dL — AB (ref 3.5–5.0)
Anion gap: 10 (ref 5–15)
BUN: 39 mg/dL — AB (ref 6–20)
CO2: 23 mmol/L (ref 22–32)
Calcium: 9.2 mg/dL (ref 8.9–10.3)
Chloride: 103 mmol/L (ref 101–111)
Creatinine, Ser: 1.4 mg/dL — ABNORMAL HIGH (ref 0.44–1.00)
GFR calc Af Amer: 45 mL/min — ABNORMAL LOW (ref >60)
GFR calc non Af Amer: 39 mL/min — ABNORMAL LOW (ref >60)
GLUCOSE: 109 mg/dL — AB (ref 65–99)
PHOSPHORUS: 5.2 mg/dL — AB (ref 2.5–4.6)
Potassium: 4.4 mmol/L (ref 3.5–5.1)
Sodium: 136 mmol/L (ref 135–145)

## 2017-10-12 LAB — GLUCOSE, CAPILLARY
GLUCOSE-CAPILLARY: 89 mg/dL (ref 65–99)
GLUCOSE-CAPILLARY: 121 mg/dL — AB (ref 65–99)
Glucose-Capillary: 99 mg/dL (ref 65–99)
Glucose-Capillary: 169 mg/dL — ABNORMAL HIGH (ref 65–99)

## 2017-10-12 LAB — MAGNESIUM: Magnesium: 2 mg/dL (ref 1.7–2.4)

## 2017-10-12 LAB — CK: CK TOTAL: 138 U/L (ref 38–234)

## 2017-10-12 MED ORDER — LINEZOLID 600 MG PO TABS: 600.0000 mg | ORAL_TABLET | Freq: Two times a day (BID) | ORAL | 0 refills | Status: AC

## 2017-10-12 MED ORDER — LIDOCAINE HCL 1 % IJ SOLN
INTRAMUSCULAR | Status: AC
  Filled 2017-10-12: qty 20

## 2017-10-12 NOTE — Progress Notes (Signed)
Physical Therapy Wound Treatment Patient Details  Name: [name] MRN: [id number] Date of Birth: 03-26-1956  Today's Date: 10/12/2017 Time: 6226-3335 Time Calculation (min): 55 min  Subjective  Subjective: Pt has changed mind and will d/c home. Pt cautiously optimistic Patient and Family Stated Goals: to heal   Pain Score: Pt premedicated with oral pain medication prior to therapy. Pt experienced 8/10 pain with treatment.  Wound Assessment  Pressure Injury 10/12/17 Stage III -  Full thickness tissue loss. Subcutaneous fat may be visible but bone, tendon or muscle are NOT exposed. Most Lateral  R sacral wound  (Active)  Wound Image   10/12/2017 11:00 AM  Dressing Type Foam;Moist to dry 10/12/2017 11:00 AM  Dressing Changed 10/12/2017  1:00 PM  Dressing Change Frequency Daily 10/12/2017  1:00 PM  State of Healing Fully granulated 10/12/2017 11:00 AM  Site / Wound Assessment Red;Granulation tissue;Pink 10/12/2017 11:00 AM  % Wound base Red or Granulating 90% 10/12/2017  1:00 PM  % Wound base Yellow/Fibrinous Exudate 10% 10/12/2017  1:00 PM  Peri-wound Assessment Erythema (blanchable);Pink;Maceration 10/12/2017 11:00 AM  Wound Length (cm) 4 cm 10/12/2017 11:00 AM  Wound Width (cm) 4 cm 10/12/2017 11:00 AM  Wound Surface Area (cm^2) 16 cm^2 10/12/2017 11:00 AM  Margins Unattached edges (unapproximated) 10/12/2017 11:00 AM  Drainage Amount Scant 10/12/2017 11:00 AM  Drainage Description Serous 10/12/2017 11:00 AM  Treatment Cleansed;Packing (Saline gauze) 10/12/2017 11:00 AM     Pressure Injury 10/12/17 Stage III -  Full thickness tissue loss. Subcutaneous fat may be visible but bone, tendon or muscle are NOT exposed. Medial R sacral wound (Active)  Wound Image   10/12/2017 11:00 AM  Dressing Type Gauze (Comment) 10/12/2017 11:00 AM  Dressing Changed 10/12/2017  1:00 PM  Dressing Change Frequency Daily 10/12/2017  1:00 PM  State of Healing Early/partial granulation 10/12/2017 11:00 AM  Site / Wound  Assessment Yellow;Red;Pink;Clean 10/12/2017 11:00 AM  % Wound base Red or Granulating 90% 10/12/2017  1:00 PM  % Wound base Yellow/Fibrinous Exudate 10% 10/12/2017  1:00 PM  Peri-wound Assessment Erythema (blanchable);Maceration;Pink 10/12/2017 11:00 AM  Wound Length (cm) 5 cm 10/12/2017 11:00 AM  Wound Width (cm) 4 cm 10/12/2017 11:00 AM  Wound Surface Area (cm^2) 20 cm^2 10/12/2017 11:00 AM  Margins Unattached edges (unapproximated) 10/12/2017 11:00 AM  Drainage Amount Minimal 10/12/2017 11:00 AM  Drainage Description Purulent;Sanguineous 10/12/2017 11:00 AM  Treatment Packing (Saline gauze) 10/12/2017 11:00 AM     Pressure Injury 10/12/17 Stage III -  Full thickness tissue loss. Subcutaneous fat may be visible but bone, tendon or muscle are NOT exposed. Central Sacral Wound  (Active)  Wound Image   10/12/2017 11:00 AM  Dressing Type Gauze (Comment);Moist to dry 10/12/2017 11:00 AM  Dressing Changed 10/12/2017  1:00 PM  Dressing Change Frequency Daily 10/12/2017  1:00 PM  State of Healing Early/partial granulation 10/12/2017 11:00 AM  Site / Wound Assessment Yellow;Pink 10/12/2017 11:00 AM  % Wound base Red or Granulating 10% 10/12/2017 11:00 AM  % Wound base Yellow/Fibrinous Exudate 90% 10/12/2017 11:00 AM  Peri-wound Assessment Maceration;Pink;Erythema (blanchable) 10/12/2017 11:00 AM  Wound Length (cm) 4 cm 10/12/2017 11:00 AM  Wound Width (cm) 2 cm 10/12/2017 11:00 AM  Wound Surface Area (cm^2) 8 cm^2 10/12/2017 11:00 AM  Margins Unattached edges (unapproximated) 10/12/2017 11:00 AM  Drainage Amount Moderate 10/12/2017 11:00 AM  Drainage Description Purulent;Odor;Serosanguineous 10/12/2017 11:00 AM  Treatment Hydrotherapy (Pulse lavage);Debridement (Selective);Packing (Saline gauze) 10/12/2017 11:00 AM   Santyl applied to wound bed  prior to dressing.    Hydrotherapy Pulsed lavage therapy - wound location: (central sacral wound) Pulsed Lavage with Suction (psi): 4 psi(due to pain) Pulsed Lavage with  Suction - Normal Saline Used: 1000 mL Pulsed Lavage Tip: Tip with splash shield Selective Debridement Selective Debridement - Location: central sacral wound Selective Debridement - Tools Used: Scalpel;Forceps Selective Debridement - Tissue Removed: necrotic, yellow slough   Wound Assessment and Plan  Wound Therapy - Assess/Plan/Recommendations Wound Therapy - Clinical Statement: Initial 9x12 cm wound has through healing become 3 distinct wounds, a lateral sacral wound which is fully red and is receiving daily dressing changes, a medial sacral wound which is almost entirely granulation tissue which is being treated with santyl and dressing change, and a central sacral wound which still has very tightly adhered slough material which is receiving daily hydrotherapy, selective debridement, scoring of the wound and santyl application along with daily dressing changes. PT will perform hydrotherapy on central sacral wound tomorrow before d/c home.  Wound Therapy - Functional Problem List: Decreased sitting tolerance Factors Delaying/Impairing Wound Healing: Diabetes Mellitus;Immobility Hydrotherapy Plan: Debridement;Dressing change;Patient/family education;Pulsatile lavage with suction Wound Therapy - Frequency: 6X / week Wound Therapy - Follow Up Recommendations: Home health RN Wound Plan: see above  Wound Therapy Goals- Improve the function of patient's integumentary system by progressing the wound(s) through the phases of wound healing (inflammation - proliferation - remodeling) by: Decrease Necrotic Tissue to: 10% Decrease Necrotic Tissue - Progress: Partly met Increase Granulation Tissue to: 90% Increase Granulation Tissue - Progress: Partly met Goals/treatment plan/discharge plan were made with and agreed upon by patient/family: Yes Time For Goal Achievement: 7 days Wound Therapy - Potential for Goals: Good  Goals will be updated until maximal potential achieved or discharge criteria met.   Discharge criteria: when goals achieved, discharge from hospital, MD decision/surgical intervention, no progress towards goals, refusal/missing three consecutive treatments without notification or medical reason.  GP    [name]. Migdalia Dk PT, DPT Acute Rehabilitation  [phone] Pager [phone]   [place] 10/12/2017, 1:31 PM

## 2017-10-12 NOTE — Procedures (Signed)
Successful placement of dual lumen PICC line to right basilic vein. Length 35 cm Tip at lower SVC/RA No complications Ready for use.   Ascencion Dike PA-C 10/12/2017 4:34 PM

## 2017-10-12 NOTE — Progress Notes (Signed)
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 Pt dc'd from [facility] on 2/24 after >1 month hospitalization. Pt readmitted to [facility] on 10/02/2017 with pain, delirium and acute renal failure; found to have displacement of lt nephrostomy tube and exchange of rt nephrostomy tube.    PT Comments    Pt continues to progress well with mobility. Able to increased amb distance with use of RW; initiated stair training with handheld assist as pt has 3 steps to enter home with no hand rail. Pt demonstrates good ability to manage multiple lines/tubes, including indep with emptying of ostomy bag. Based on progress, feel pt is safe to return home with initial assist from husband and HHPT services. Will follow acutely.   Follow Up Recommendations  Home health PT;Supervision/Assistance - 24 hour     Equipment Recommendations  None recommended by PT    Recommendations for Other Services       Precautions / Restrictions Precautions Precautions: Fall;Other (comment) Precaution Comments: bilateral nephrostomy tubes, colostomy, sacral wound Restrictions Weight Bearing Restrictions: No    Mobility  Bed Mobility Overal bed mobility: Independent Bed Mobility: Supine to Sit;Sit to Supine              Transfers Overall transfer level: Modified independent Equipment used: Rolling walker (2 wheeled) Transfers: Sit to/from Stand              Ambulation/Gait Ambulation/Gait assistance: Supervision Ambulation Distance (Feet): 200 Feet Assistive  device: Rolling walker (2 wheeled) Gait Pattern/deviations: Step-through pattern;Decreased stride length;Trunk flexed     General Gait Details: Slow, controlled amb with RW and supervision for safety, requiring 1x standing rest breaks secondary to fatigue. Intermittent cues to maintain upright posture as pt tends to hunch over RW   Stairs Stairs: Yes   Stair Management: One rail Right;No rails;Forwards Number of Stairs: 5 General stair comments: Able to simulate ascending steps by high marching with single UE support. Supervision for doing so with rail support. Required minA/single HHA without rail support (pt does not have rails at home)  Wheelchair Mobility    Modified Rankin (Stroke Patients Only)       Balance Overall balance assessment: Needs assistance Sitting-balance support: Feet supported Sitting balance-Leahy Scale: Fair Sitting balance - Comments: Indep to empty ostomy bag sitting EOB   Standing balance support: Bilateral upper extremity supported Standing balance-Leahy Scale: Poor Standing balance comment: Reliant on UE support                            Cognition Arousal/Alertness: Awake/alert Behavior During Therapy: Anxious Overall Cognitive Status: Impaired/Different from baseline Area of Impairment: Awareness;Problem solving;Attention                   Current Attention Level: Selective       Awareness: Emergent Problem Solving: Requires verbal cues General Comments: Still overwhelmed with condition and decision-making for HHPT vs. SNF, but more appropriately interactive this session      Exercises      General Comments        Pertinent Vitals/Pain Pain Assessment: Faces  Faces Pain Scale: Hurts a little bit Pain Location: R posterior drain incision site Pain Descriptors / Indicators: Grimacing;Guarding Pain Intervention(s): Monitored during session    Home Living                      Prior Function             PT Goals (current goals can now be found in the care plan section) Acute Rehab PT Goals Patient Stated Goal: To feel better PT Goal Formulation: With patient/family Time For Goal Achievement: 10/20/17 Potential to Achieve Goals: Good Progress towards PT goals: Progressing toward goals    Frequency    Min 3X/week      PT Plan Discharge plan needs to be updated    Co-evaluation              AM-PAC PT "6 Clicks" Daily Activity  Outcome Measure  Difficulty turning over in bed (including adjusting bedclothes, sheets and blankets)?: None Difficulty moving from lying on back to sitting on the side of the bed? : None Difficulty sitting down on and standing up from a chair with arms (e.g., wheelchair, bedside commode, etc,.)?: None Help needed moving to and from a bed to chair (including a wheelchair)?: A Little Help needed walking in hospital room?: A Little Help needed climbing 3-5 steps with a railing? : A Little 6 Click Score: 21    End of Session Equipment Utilized During Treatment: (no gait belt due to multiple drains/tubes) Activity Tolerance: Patient tolerated treatment well Patient left: in bed;with call bell/phone within reach;Other (comment)(with hydrotherapy PT and CM) Nurse Communication: Mobility status PT Visit Diagnosis: Dizziness and giddiness (R42) Pain - part of body: (sacrum)     Time: 1916-6060 PT Time Calculation (min) (ACUTE ONLY): 24 min  Charges:  $Gait Training: 8-22 mins $Self Care/Home Management: 8-22                    G Codes:      Mabeline Caras, PT, DPT Acute Rehab Services  Pager: [place] 10/12/2017, 11:53 AM

## 2017-10-12 NOTE — Progress Notes (Signed)
   PT recommending another day of hydrotherapy. Will cancel discharge order today, monitor wound status tomorrow.    Dessa Phi, DO [facility] [email or web address] Password [facility] 10/12/2017, 11:58 AM

## 2017-10-12 NOTE — Discharge Summary (Addendum)
Physician Discharge Summary  [name] WYO:[id number] DOB: [birth year] DOA: 10/02/2017  PCP: Ann Held, DO  Admit date: 10/02/2017 Discharge date: 10/13/2017  Admitted From: Home Disposition: SNF  Recommendations for Outpatient Follow-up:  1. Follow up with PCP in 1 week 2. Follow up with Dr. Hollie Salk, [facility] in 1 week  3. Follow up with Dr. Tresa Moore, Urology 4. Follow up with Dr. Alvy Bimler for lymph node biopsy scheduling  5. Please obtain BMP/Mg/Phos every weekly to assess for replacement needs   Wound care, stage 3 right sacral, central sacral, daily dressing changes with santyl   Ostomy care, right abdominal plane  Administer 1L normal saline IVF up to 3 times weekly as needed for ostomy and nephrostomy output total > 3-4L     Discharge Condition: Stable, improved CODE STATUS: Full  Diet recommendation: Regular diet   Brief/Interim Summary: [name] is a 62 year old female with history of endometrial cancer, ovarian cancer, diabetes, hypothyroidism, hypertension, chronic kidney disease, colostomy 2016 w/bladder injury, R ureteral stricture, reimplantation 2016 at time of colostomy takedown 2018, 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 She was found to have ureteral stent infection with MRSA, placed on daptomycin.  Infectious disease consulted and appreciated, recommending discharge with Linezolid twice daily for 2 weeks.  Nephrology also consulted and following. Urology consulted and feels patient would need urinary diversion procedure however will be sometime in the future.  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   Decubitus ulcer of sacral region, stage 2   Metabolic acidosis   Poor venous access   Encounter for palliative care   Goals of care, counseling/discussion   Acute kidney injury superimposed on chronic kidney disease, stage III -Baseline creatinine 2 however back in January 2019, patient was noted to have a creatinine of 4.28 which required bilateral percutaneous tubes to be placed to divert urine from the bladder due to colovesical fistula -Patient with right ureteral stricture and bladder injury -Patient had replacement of left PCN-was noted to have purulent urine from exit site -Right percutaneous tube was exchanged on 10/02/2017 -Creatinine currently stable at 1.25 -Nephrology consulted and appreciated, now signed off as Cr has remained stable -Nephrology recommended consideration of PICC and intermittent IVF to keep up with ostomy volume losses. Will arrange for PICC placement and IVF intermittently. Will need close follow up with nephrology as outpatient.   Sepsis secondary to MRSA UTI / ureteral stent infection -Patient was tachycardic with tachypnea on the day of admission -UA showed many bacteria, TNTC WBC, large leukocytes. Urine culture shows MRSA -Blood cultures no growth to date -Infectious disease consulted and appreciated, currently on daptomycin. Recommended use of linezolid for extended 2 weeks at discharge   Hypomagnesemia -Replaced   Anemia secondary to chronic disease/iron deficiency -Hemoglobin dropped to 7.3, received transfusion -Hemglobin stable, currently 9.5  Sacral decubitus ulcer -Receive PT hydrotherapy. Will need wound care  at discharge.    Encephalopathy, metabolic -Resolved, suspect likely secondary to infection and acute kidney injury. Back to baseline   Metastatic endometrial cancer/chronic neutropenia -Patient takes filgastrin at home -Continue Arimidex  Left axillary lymph node -Dr. Ree Kida discussed with oncology, Dr. Alvy Bimler- recommended US -Obtained US: suspicious hypoechoic irregular mass in the region of palpable concern in the left chest wall. Recurrent breast cancer cannot be excluded.  -Recommend outpatient biopsy. Dr. Alvy Bimler to arrange.   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 -Urology consulted and appreciated, patient will likely need diversion at some point in the future  -Continue to monitor intake and output -Patient still with high output, continue metamucil  -Restarted lomotil PRN, and scheduled one dose daily  Hypothyroidism -Continue Synthroid  Diabetes mellitus, type II -Continue insulin sliding scale, CBG monitoring -Lantus currently held due to poor oral intake  Protein calorie malnutrition, moderate -Patient consulted, continue supplements -Patient has had better intake over the past few days   Discharge Instructions  Discharge Instructions    Diet - low sodium heart healthy   Complete by:  As directed    Diet - low sodium heart healthy   Complete by:  As directed    Increase activity slowly   Complete by:  As directed    Increase activity slowly   Complete by:  As directed      Allergies as of 10/13/2017      Reactions   Penicillins Swelling   Facial swelling/childhood allergy Has patient had a PCN reaction causing immediate rash, facial/tongue/throat swelling, SOB or lightheadedness with hypotension: Yes Has patient had a PCN reaction causing severe rash involving mucus membranes or skin necrosis: Yes Has patient had a PCN reaction that required hospitalization yes Has patient had a PCN reaction occurring within the last 10 years:  No If all of the above answers are "NO", then may proceed with Cephalosporin use.   Adhesive [tape]    blisters   Cefaclor Rash   Ceclor   Erythromycin Other (See Comments)   Gastritis, abd cramps   Trimethoprim Rash   Ultram [tramadol] Hives   Fluconazole Rash   Oxycodone Other (See Comments)   " I just feel weird"   Pectin Rash   Pectin ring for stoma   Sulfa Antibiotics Rash      Medication List    STOP taking these medications   ciprofloxacin 500 MG tablet Commonly known as:  CIPRO   DULoxetine 30 MG capsule Commonly known as:  CYMBALTA   famotidine-calcium carbonate-magnesium hydroxide 10-800-165 MG chewable tablet Commonly known as:  PEPCID COMPLETE   insulin glargine 100 UNIT/ML injection Commonly known as:  LANTUS   sertraline 50 MG tablet Commonly known as:  ZOLOFT     TAKE these medications   acetaminophen 325 MG tablet Commonly known as:  TYLENOL Take 2 tablets (650 mg total) by mouth every 6 (six) hours as needed.   ALPRAZolam 0.5 MG tablet Commonly known as:  XANAX Take 1 tablet (0.5 mg total) by mouth 3 (three) times daily as needed for anxiety.   anastrozole 1 MG tablet Commonly known as:  ARIMIDEX Take 1 tablet (1 mg total) by mouth daily.   artificial tears Oint ophthalmic ointment Commonly known as:  LACRILUBE Place into both eyes at bedtime. What changed:  how much to take   Biotin 5 MG Tabs Take 5 mg by mouth every morning.   diphenhydrAMINE 25 mg capsule Commonly known as:  BENADRYL Take 1 capsule (25  mg total) by mouth every 8 (eight) hours as needed for itching, allergies or sleep.   diphenoxylate-atropine 2.5-0.025 MG tablet Commonly known as:  LOMOTIL Take 1 tablet by mouth 4 (four) times daily as needed for diarrhea or loose stools. TO PREVENT LOOSE BOWEL MOVEMENTS   famotidine 20 MG tablet Commonly known as:  PEPCID Take 20 mg by mouth daily as needed for heartburn or indigestion.   fentaNYL 50 MCG/HR Commonly known as:   DURAGESIC - dosed mcg/hr Place 1 patch (50 mcg total) onto the skin every 3 (three) days for 3 days. What changed:  Another medication with the same name was removed. Continue taking this medication, and follow the directions you see here.   ferrous sulfate 325 (65 FE) MG tablet Take 1 tablet (325 mg total) by mouth at bedtime.   filgrastim 480 MCG/1.6ML injection Commonly known as:  NEUPOGEN Inject 480 mcg/1.6 ml under the skin every 6 days for life (last dose 09/22/17)   Gerhardt's butt cream Crea Apply 1 application topically 4 (four) times daily.   HYDROmorphone 2 MG tablet Commonly known as:  DILAUDID Take 1 tablet (2 mg total) by mouth every 3 (three) hours as needed for moderate pain or severe pain. What changed:    medication strength  how much to take   insulin aspart 100 UNIT/ML injection Commonly known as:  novoLOG Inject 0-20 Units into the skin 3 (three) times daily with meals. What changed:    how much to take  when to take this  additional instructions   insulin aspart 100 UNIT/ML injection Commonly known as:  novoLOG Inject 7 Units into the skin 3 (three) times daily with meals. What changed:  Another medication with the same name was changed. Make sure you understand how and when to take each.   levothyroxine 150 MCG tablet Commonly known as:  SYNTHROID, LEVOTHROID Take 1 tablet (150 mcg total) by mouth daily before breakfast.   lidocaine 5 % Commonly known as:  LIDODERM Place 1 patch onto the skin daily. Remove & Discard patch within 12 hours   linezolid 600 MG tablet Commonly known as:  ZYVOX Take 1 tablet (600 mg total) by mouth 2 (two) times daily for 14 days.   lip balm ointment Apply 1 application topically 2 (two) times daily.   loperamide 2 MG capsule Commonly known as:  IMODIUM Take 1-2 capsules (2-4 mg total) by mouth every 8 (eight) hours as needed for diarrhea or loose stools (Use if >2 BM every 8 hours).   loratadine 10 MG  tablet Commonly known as:  CLARITIN Take 10 mg by mouth every morning.   methocarbamol 750 MG tablet Commonly known as:  ROBAXIN Take 1 tablet (750 mg total) by mouth 3 (three) times daily. What changed:    when to take this  reasons to take this   omega-3 acid ethyl esters 1 g capsule Commonly known as:  LOVAZA Take 1 g by mouth 2 (two) times daily.   ondansetron 4 MG disintegrating tablet Commonly known as:  ZOFRAN-ODT Take 4 mg by mouth every 6 (six) hours as needed for nausea or vomiting.   polyvinyl alcohol 1.4 % ophthalmic solution Commonly known as:  LIQUIFILM TEARS Place 1 drop into both eyes at bedtime.   pregabalin 50 MG capsule Commonly known as:  LYRICA Take 50 mg by mouth 2 (two) times daily. What changed:  Another medication with the same name was removed. Continue taking this medication, and follow the directions  you see here.   PRENATAL VITAMIN PO Take 1 capsule by mouth daily. Takes prenatal because there are no dyes in it   prochlorperazine 10 MG tablet Commonly known as:  COMPAZINE Take 1 tablet (10 mg total) by mouth 2 (two) times daily as needed for nausea or vomiting.   promethazine 25 MG tablet Commonly known as:  PHENERGAN Take 1 tablet (25 mg total) by mouth every 6 (six) hours as needed for nausea or vomiting. What changed:  when to take this   protein supplement shake Liqd Commonly known as:  PREMIER PROTEIN Take 325 mLs (11 oz total) by mouth 2 (two) times daily as needed (Pt to request).   psyllium 95 % Pack Commonly known as:  HYDROCIL/METAMUCIL Take 1 packet by mouth 2 (two) times daily.   ranitidine 150 MG tablet Commonly known as:  ZANTAC Take 1 tablet (150 mg total) by mouth 2 (two) times daily as needed for heartburn.   rosuvastatin 10 MG tablet Commonly known as:  CRESTOR Take 10 mg by mouth every evening.   SANTYL ointment Generic drug:  collagenase Apply 1 application topically daily.   sitaGLIPtin 100 MG  tablet Commonly known as:  JANUVIA Take 100 mg by mouth every morning.   SYSTANE OP Place 1 drop into both eyes at bedtime.   traZODone 100 MG tablet Commonly known as:  DESYREL Take 1 tablet (100 mg total) by mouth at bedtime as needed for sleep.   TUBERSOL 5 UNIT/0.1ML injection Generic drug:  tuberculin Inject 5 Units into the skin every 14 (fourteen) days.   Vitamin D3 10000 units capsule Take 10,000 Units by mouth once a week. Sunday evening's      Follow-up Information    Ann Held, DO. Schedule an appointment as soon as possible for a visit in 1 week(s).   Specialty:  Family Medicine Contact information: [address] [place] [address] [phone]        Madelon Lips, MD. Schedule an appointment as soon as possible for a visit in 1 week(s).   Specialty:  Nephrology Contact information: [place] [address] [phone]        Alexis Frock, MD Follow up.   Specialty:  Urology Contact information: [place] [address] [phone]        Heath Lark, MD. Call.   Specialty:  Hematology and Oncology Why:  For lymph node biopsy scheduling  Contact information: [place] [address] [phone]          Allergies  Allergen Reactions  . Penicillins Swelling    Facial swelling/childhood allergy Has patient had a PCN reaction causing immediate rash, facial/tongue/throat swelling, SOB or lightheadedness with hypotension: Yes Has patient had a PCN reaction causing severe rash involving mucus membranes or skin necrosis: Yes Has patient had a PCN reaction that required hospitalization yes Has patient had a PCN reaction occurring within the last 10 years: No If all of the above answers are "NO", then may proceed with Cephalosporin use.   . Adhesive [Tape]     blisters  . Cefaclor Rash    Ceclor  . Erythromycin Other (See Comments)    Gastritis, abd  cramps  . Trimethoprim Rash  . Ultram [Tramadol] Hives  . Fluconazole Rash  . Oxycodone Other (See Comments)    " I just feel weird"  . Pectin Rash    Pectin ring for stoma  . Sulfa  Antibiotics Rash    Consultations: Infectious disease Nephrology Interventional radiology Urology Palliative care Oncology, Dr. Alvy Bimler, via phone General surgery, via phone   Procedures/Studies: Ct Abdomen Pelvis Wo Contrast  Result Date: 10/02/2017 CLINICAL DATA:  Pt has sepsis Had recent enterococcus wound infection and also serratia UTI EXAM: CT ABDOMEN AND PELVIS WITHOUT CONTRAST TECHNIQUE: Multidetector CT imaging of the abdomen and pelvis was performed following the standard protocol without IV contrast. COMPARISON:  09/22/2017 FINDINGS: Lower chest: There is patchy opacity in the lower lobes at the lung bases, right greater than left. This is new since the prior CT. This may all be atelectasis. Pneumonia should be considered if there are consistent clinical findings. No pulmonary edema. No pleural effusion. Hepatobiliary: Normal liver. Gallbladder surgically absent. Mild prominence of common bile duct with normal distal tapering. Pancreas: No pancreatic mass or inflammation. Mild pancreatic atrophy. Spleen: Normal in size without focal abnormality. Adrenals/Urinary Tract: 17 mm left adrenal mass, stable from the prior CT. Left kidney is swollen with a poorly defined collecting system that appears at least mildly dilated. There is a nonobstructing stone in the lower pole. Right ureter is mildly dilated. It is not well visualized distally. No convincing ureteral stone. A nephrostomy catheter lies within the right kidney. No significant right hydronephrosis. Right ureter is not well followed distally. No convincing ureteral stone. No renal masses. Bladder is decompressed with a Foley catheter. Nephrostomy tube on the left has been removed since the prior CT. Stomach/Bowel: No bowel obstruction or convincing  inflammation. Right mid abdomen colostomy. Stomach is unremarkable. Vascular/Lymphatic: There are prominent retroperitoneal lymph nodes, none pathologically enlarged, all stable from the prior CT. No vascular abnormality. Reproductive: Uterus is surgically absent.  No discrete pelvic mass. Other: The presacral collection noted on the prior CT dated 09/15/2017, is no longer discretely seen. There is still soft tissue attenuation in this location adjacent to surgical vascular clips. The left posterior pelvic pigtail catheter has been removed since the prior exam. No significant ascites. Musculoskeletal: No fracture or acute finding. No osteoblastic or osteolytic lesions. IMPRESSION: 1. Since the prior CT, the left posterior pelvic pigtail catheter has been removed. This catheter had withdrawn into the gluteus maximus from the presacral collection seen on the CT dated 09/15/2017. 2. The presacral fluid collection, where the pigtail catheter had originally been placed, is no longer discretely visualized, although there is still soft tissue attenuation in the presacral space. The residual soft tissue attenuation may be postsurgical scarring/change. No definite residual or recurrent abscess. 3. There are new lower lobe airspace opacities at the lung bases, which may all be atelectasis. Pneumonia should be suspected if there are consistent clinical findings. 4. Left nephrostomy tube has been removed since the prior CT. Left kidney appears swollen with at least mild hydronephrosis. The proximal left ureter is mildly distended. The distal ureter is not well-defined. 5. No change in the appearance of the right kidney with stable positioning of the right nephrostomy tube. 6. Changes from bowel surgery and formation of the right mid to upper quadrant colostomy are stable. Electronically Signed   By: Lajean Manes M.D.   On: 10/02/2017 10:13   Ct Abdomen Pelvis Wo Contrast  Result Date: 09/22/2017 CLINICAL DATA:  Status post  presacral abscess drainage and bilateral nephrostomies. Generalized abdominal pain. EXAM: CT ABDOMEN AND PELVIS WITHOUT CONTRAST TECHNIQUE: Multidetector CT imaging of the abdomen and pelvis was performed following the standard protocol without IV contrast. COMPARISON:  CT scan of September 15, 2017  and September 16, 2017. FINDINGS: Lower chest: No acute abnormality. Hepatobiliary: No focal liver abnormality is seen. Status post cholecystectomy. No biliary dilatation. Pancreas: Unremarkable. No pancreatic ductal dilatation or surrounding inflammatory changes. Spleen: Normal in size without focal abnormality. Adrenals/Urinary Tract: Stable left adrenal nodule is noted. Right adrenal gland appears normal. Bilateral nephrostomy tubes are noted in grossly good position. No hydronephrosis or renal obstruction is noted. Urinary bladder is decompressed secondary to Foley catheter. Stomach/Bowel: The stomach appears normal. 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 No abnormal bowel dilatation is noted. Vascular/Lymphatic: No significant vascular findings are present. No enlarged abdominal or pelvic lymph nodes. Reproductive: Status post hysterectomy. No adnexal masses. Other: The presacral fluid collection noted on prior exam may be smaller compared to prior exam, although is difficult to assess due to the lack of intravenous contrast. The drainage catheter placed previously has withdrawn significantly, with the distal tip of the catheter now present within the left gluteal musculature outside of the pelvis. Musculoskeletal: No acute or significant osseous findings. IMPRESSION: The pelvic drainage catheter placed 1 week ago has significantly withdrawn, with the distal tip now present within the left gluteal musculature, not the presacral space. The presacral fluid collection noted on prior exam may be smaller currently, but it is difficult to assess due to lack of intravenous contrast. Stable bilateral nephrostomy  catheters are noted without hydronephrosis or obstruction. 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 Probable ileostomy seen in right upper quadrant, and patient is status post almost complete colectomy. Electronically Signed   By: Marijo Conception, M.D.   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 Unfortunately, the patient has experienced persistent right-sided flank pain attributable to the right-sided nephrostomy catheter. EXAM: CT ABDOMEN AND PELVIS WITHOUT CONTRAST TECHNIQUE: Multidetector CT imaging of the abdomen and pelvis was performed following the standard protocol without IV contrast. COMPARISON:  CT abdomen pelvis-08/31/2017; 02/09/2016; ultrasound fluoroscopic guided bilateral percutaneous nephrostomy catheter placement-09/02/2016; right-sided antegrade nephrostogram - 09/13/2017 FINDINGS: Lack of intravenous contrast limits the ability to evaluate solid abdominal organs. Lower chest: Limited visualization of the lower thorax demonstrates minimal subsegmental atelectasis within the imaged bilateral lower lobes, right greater than left. No discrete focal airspace opacities. No pleural effusion. Normal heart size.  No pericardial effusion. Hepatobiliary: Normal hepatic contour. Post cholecystectomy. Persistent dilatation of the common bile duct, measuring approximately 1.3 cm (as measured in greatest oblique short axis coronal diameter - image 51, series 5), presumably secondary to biliary reservoir phenomenon. No radiopaque gallstones. No ascites. Pancreas: Normal noncontrast appearance of the pancreas Spleen: Normal noncontrast appearance of the spleen Adrenals/Urinary Tract: Bilateral percutaneous nephrostomy catheters are  probe with the position with very mild residual left-sided pelvicaliectasis. No significant right-sided pelvicaliectasis. Both percutaneous nephrostomy catheters take a slightly serpiginous tract however this is likely secondary to patient body habitus. No definitive associated pericatheter hematoma. There is no definitive involvement of either hemidiaphragm at both nephrostomies are below the 12th rib. Mid minimal amount of bilateral perinephric stranding, unchanged. Previously characterized approximately 0.7 cm adenoma within left adrenal gland is unchanged (image 22, series 2). Normal noncontrast appearance of the right adrenal gland. Urinary bladder is decompressed with Foley catheter. Stomach/Bowel: Redemonstrated loop ileostomy within the right mid hemiabdomen and the sequela of near complete  colectomy and small bowel resection. There is a grossly unchanged approximately 4.7 x 3.7 cm fluid collection within the presacral space (image 71, series 2) at the location of previously noted contained perforation involving a suspected loop of small bowel extends down to the midline of the lower pelvis. Mild distention of the stomach. No evidence of enteric obstruction. No pneumoperitoneum, pneumatosis or portal venous gas. Vascular/Lymphatic: Normal caliber the abdominal aorta. No bulky retroperitoneal, mesenteric, pelvic or inguinal lymphadenopathy on this noncontrast examination. Reproductive: Post hysterectomy.  No discrete adnexal lesion. Other: Diffuse body wall anasarca. Stranding within the left upper abdominal quadrant at location of prior end colostomy. Post bilateral breast augmentation. Musculoskeletal: No acute or aggressive osseous abnormalities. No definitive evidence of osteolysis though the presacral fluid collection does abut the ventral aspect of the sacrum and coccyx. IMPRESSION: 1. Appropriately positioned bilateral percutaneous nephrostomy catheters without evidence complication. 2. Grossly  unchanged approximately 4.7 cm fluid collection within the presacral space at the location of previously noted contained perforation involving a suspected loop of small bowel which extends down to the midline of the lower pelvis. While there is no evidence of osteolysis to suggest osteomyelitis, the presacral fluid collection is noted to abut the ventral aspect of the sacrum coccyx. Further evaluation with pelvic MRI could be performed as indicated. 3. Stable sequela of loop ileostomy and small bowel anastomosis without evidence of enteric obstruction. Electronically Signed   By: Sandi Mariscal M.D.   On: 09/15/2017 17:50   Ct Head Wo Contrast  Result Date: 10/02/2017 CLINICAL DATA:  Altered mental status.  Nonverbal. EXAM: CT HEAD WITHOUT CONTRAST TECHNIQUE: Contiguous axial images were obtained from the base of the skull through the vertex without intravenous contrast. COMPARISON:  None. FINDINGS: Brain: No evidence of acute infarction, hemorrhage, hydrocephalus, extra-axial collection or mass lesion/mass effect. Vascular: No hyperdense vessel or unexpected calcification. Skull: Normal. Negative for fracture or focal lesion. Sinuses/Orbits: Globes and orbits are unremarkable. There is a small amount of dependent fluid in the right sphenoid sinus. Sinuses otherwise clear. Clear mastoid air cells. Other: None. IMPRESSION: No intracranial abnormality. Electronically Signed   By: Lajean Manes M.D.   On: 10/02/2017 12:04   Dg Chest Port 1 View  Result Date: 10/03/2017 CLINICAL DATA:  Central line placement. EXAM: PORTABLE CHEST 1 VIEW COMPARISON:  10/02/2017 FINDINGS: Left internal jugular central venous line has its tip projecting at the caval atrial junction. No pneumothorax. Cardiac silhouette is normal in size. No mediastinal or hilar masses. Clear lungs. IMPRESSION: 1. Left internal jugular central venous line is well positioned, tip projecting at the caval atrial junction. No pneumothorax. 2. No acute  cardiopulmonary disease. Electronically Signed   By: Lajean Manes M.D.   On: 10/03/2017 12:17   Dg Chest Portable 1 View  Result Date: 10/02/2017 CLINICAL DATA:  Lethargy, dyspnea EXAM: PORTABLE CHEST 1 VIEW COMPARISON:  08/31/2017 FINDINGS: Lungs are clear.  No pleural effusion or pneumothorax. The heart is normal in size. IMPRESSION: No evidence of acute cardiopulmonary disease. Electronically Signed   By: Julian Hy M.D.   On: 10/02/2017 08:12   [place] Lt Upper Extrem Ltd Soft Tissue Non Vascular  Result Date: 10/11/2017 CLINICAL DATA:  62 year old patient with history of left breast cancer diagnosed in 2017. She has a history of bilateral mastectomies in May of 2017. Currently, she is an inpatient at [facility] and was evaluated with ultrasound because of a palpable mass in the lateral left chest wall inferior to the axilla.  I was not present for this ultrasound examination and have not examined the patient myself. EXAM: ULTRASOUND LEFT UPPER EXTREMITY LIMITED TECHNIQUE: Ultrasound examination of the upper extremity soft tissues was performed in the area of clinical concern. COMPARISON:  Prior ultrasound-guided left breast biopsy in 2017. FINDINGS: Images labeled left chest lateral inferior to axilla in the palpable region show an irregular hypoechoic mass with internal vascularity. The mass measures approximately 0.6 x 0.7 x 0.9 cm, and has hypoechoic spicules extending peripheral to the dominant portion of the mass. IMPRESSION: Suspicious hypoechoic irregular mass in the region of palpable concern in the left chest wall in this patient status post mastectomy for history of breast cancer. Recurrent breast cancer cannot be excluded. Consider surgical consultation. Alternatively, when the patient is medically stable, she can have evaluation and possible biopsy at the [facility]. These results will be called to the ordering clinician or representative by the Radiologist  Assistant, and communication documented in the PACS or zVision Dashboard. Electronically Signed   By: Curlene Dolphin M.D.   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 After recent fall, the patient is complaining of significant pain in the right flank region near the site of the indwelling right nephrostomy tube. EXAM: RIGHT NEPHROSTOGRAM COMPARISON:  Imaging at the time of nephrostomy tube placement on 09/02/2017 MEDICATIONS: None ANESTHESIA/SEDATION: None CONTRAST:  5 mL Isovue-300-administered into the collecting system(s) FLUOROSCOPY TIME:  Fluoroscopy Time: 12 seconds.  3.8 mGy. COMPLICATIONS: None immediate. PROCEDURE: Under fluoroscopy, contrast material was injected via the pre-existing right percutaneous nephrostomy tube. Fluoroscopic images were saved as a cine loop. The catheter was flushed and reattached to a gravity drainage bag. FINDINGS: The percutaneous nephrostomy tube is intact without evidence of catheter rupture or contrast extravasation. The catheter is formed at the level of the renal pelvis and is well positioned. No significant filling defects are identified in the renal collecting system or opacified ureter. IMPRESSION: Unremarkable right nephrostogram via pre-existing percutaneous nephrostomy tube demonstrating intact nephrostomy tube which is positioned in the right renal pelvis. Electronically Signed   By: Aletta Edouard M.D.   On: 09/13/2017 16:10   Ir Nephrostomy Placement Left  Result Date: 10/02/2017 INDICATION: History of endometrial cancer complicated by development of bilateral ureteral obstruction, post bilateral percutaneous nephrostomy catheter placement on 09/02/2017. Unfortunately, the patient has now been admitted with interval removal of left-sided nephrostomy catheter, as  well as findings worrisome for impending urosepsis with markedly cloudy urine within the right nephrostomy. As such, request made for image guided replacement of left-sided nephrostomy catheter as well as right-sided nephrostomy catheter exchange and up sizing. EXAM: 1. FLUOROSCOPIC GUIDED REPLACEMENT OF LEFT-SIDED NEPHROSTOMY CATHETER 2. FLUOROSCOPIC GUIDED EXCHANGE OF RIGHT-SIDED NEPHROSTOMY CATHETER EXCHANGE COMPARISON:  CT abdomen and pelvis - 10/02/2017; 09/22/2017; ultrasound and fluoroscopic guided bilateral percutaneous nephrostomy catheter placement - 09/02/2017 CONTRAST:  A total of 20 mL Isovue-300 administered was administered into both collecting systems FLUOROSCOPY TIME:  4 minute 18 seconds (88 mGy) COMPLICATIONS: None immediate. TECHNIQUE: Informed written consent was obtained from the the patient's family after a discussion of the risks, benefits and alternatives to treatment. Questions regarding the procedure were encouraged and answered. A timeout was performed prior to the initiation of the procedure. The bilateral flanks and external portions of the existing right-sided percutaneous nephrostomy catheter and  entrance site of inadvertently removed left-sided nephrostomy catheter were prepped and draped in the usual sterile fashion. A sterile drape was applied covering the operative field. Maximum barrier sterile technique with sterile gowns and gloves were used for the procedure. A timeout was performed prior to the initiation of the procedure. A pre procedural spot fluoroscopic image was obtained. The entrance site of the inadvertently removed left-sided nephrostomy catheter was cannulated with a Christmas tree adapter. Contrast injection was performed. With the use of a regular glidewire, a Kumpe catheter was advanced through the subcutaneous tract to the level of the left renal pelvis. Contrast injection confirmed appropriate positioning. Next, over a short Amplatz wire, a new 39 French  percutaneous drainage catheter was advanced through the existing nephrostomy catheter track with end ultimately coiled and locked within the left renal pelvis. Contrast injection confirmed appropriate positioning. A sample of urine was aspirated, capped and sent to the laboratory for analysis. Attention was now paid towards the exchange and up sizing of the right-sided nephrostomy catheter. Contrast injection confirmed appropriate positioning and functionality of existing right-sided nephrostomy catheter. A small amount of urine was aspirated from the existing right-sided nephrostomy catheter, capped and sent to the laboratory for analysis. Next, the external portion of the nephrostomy catheter was cut and cannulated with a short Amplatz wire which was coiled within the right renal pelvis. Under intermittent fluoroscopic guidance, the existing 10 French nephrostomy catheter was exchanged for a new, slightly larger now 12 French nephrostomy catheter with end ultimately coiled and locked within the right renal pelvis. Contrast injection confirmed appropriate positioning. Both nephrostomy catheters were secured at the skin entrance site with 2 interrupted sutures and StatLock devices. Both nephrostomy catheters were reconnected to gravity bags. Dressings were placed. The patient tolerated the above procedures well without immediate postprocedural complication. FINDINGS: Subcutaneous tract injection demonstrated persistent patency of the left-sided percutaneous nephrostomy catheter tract. Successful fluoroscopic guided recanalization of the left-sided nephrostomy catheter track allowing placement of a new 12 French percutaneous drainage catheter with end coiled end locked within the left renal pelvis. Successful fluoroscopic guided exchange and up sizing of new now 38 French right-sided percutaneous nephrostomy catheter with end coiled and locked within the right renal pelvis. IMPRESSION: 1. Successful fluoroscopic  guided recanalization of the left-sided nephrostomy catheter track allowing placement of a new 12 French percutaneous drainage catheter with and coiled end locked within the left renal pelvis. 2. Successful fluoroscopic guided exchange and up sizing of new now 70 French right-sided percutaneous nephrostomy catheter with end coiled and locked within the right renal pelvis. 3. Urine sample capped and sent to the laboratory for analysis. Electronically Signed   By: Sandi Mariscal M.D.   On: 10/02/2017 18:29   Ir Nephrostomy Exchange Right  Result Date: 10/02/2017 INDICATION: History of endometrial cancer complicated by development of bilateral ureteral obstruction, post bilateral percutaneous nephrostomy catheter placement on 09/02/2017. Unfortunately, the patient has now been admitted with interval removal of left-sided nephrostomy catheter, as well as findings worrisome for impending urosepsis with markedly cloudy urine within the right nephrostomy. As such, request made for image guided replacement of left-sided nephrostomy catheter as well as right-sided nephrostomy catheter exchange and up sizing. EXAM: 1. FLUOROSCOPIC GUIDED REPLACEMENT OF LEFT-SIDED NEPHROSTOMY CATHETER 2. FLUOROSCOPIC GUIDED EXCHANGE OF RIGHT-SIDED NEPHROSTOMY CATHETER EXCHANGE COMPARISON:  CT abdomen and pelvis - 10/02/2017; 09/22/2017; ultrasound and fluoroscopic guided bilateral percutaneous nephrostomy catheter placement - 09/02/2017 CONTRAST:  A total of 20 mL Isovue-300 administered was administered into both collecting  systems FLUOROSCOPY TIME:  4 minute 18 seconds (88 mGy) COMPLICATIONS: None immediate. TECHNIQUE: Informed written consent was obtained from the the patient's family after a discussion of the risks, benefits and alternatives to treatment. Questions regarding the procedure were encouraged and answered. A timeout was performed prior to the initiation of the procedure. The bilateral flanks and external portions of the  existing right-sided percutaneous nephrostomy catheter and entrance site of inadvertently removed left-sided nephrostomy catheter were prepped and draped in the usual sterile fashion. A sterile drape was applied covering the operative field. Maximum barrier sterile technique with sterile gowns and gloves were used for the procedure. A timeout was performed prior to the initiation of the procedure. A pre procedural spot fluoroscopic image was obtained. The entrance site of the inadvertently removed left-sided nephrostomy catheter was cannulated with a Christmas tree adapter. Contrast injection was performed. With the use of a regular glidewire, a Kumpe catheter was advanced through the subcutaneous tract to the level of the left renal pelvis. Contrast injection confirmed appropriate positioning. Next, over a short Amplatz wire, a new 4 French percutaneous drainage catheter was advanced through the existing nephrostomy catheter track with end ultimately coiled and locked within the left renal pelvis. Contrast injection confirmed appropriate positioning. A sample of urine was aspirated, capped and sent to the laboratory for analysis. Attention was now paid towards the exchange and up sizing of the right-sided nephrostomy catheter. Contrast injection confirmed appropriate positioning and functionality of existing right-sided nephrostomy catheter. A small amount of urine was aspirated from the existing right-sided nephrostomy catheter, capped and sent to the laboratory for analysis. Next, the external portion of the nephrostomy catheter was cut and cannulated with a short Amplatz wire which was coiled within the right renal pelvis. Under intermittent fluoroscopic guidance, the existing 10 French nephrostomy catheter was exchanged for a new, slightly larger now 12 French nephrostomy catheter with end ultimately coiled and locked within the right renal pelvis. Contrast injection confirmed appropriate positioning. Both  nephrostomy catheters were secured at the skin entrance site with 2 interrupted sutures and StatLock devices. Both nephrostomy catheters were reconnected to gravity bags. Dressings were placed. The patient tolerated the above procedures well without immediate postprocedural complication. FINDINGS: Subcutaneous tract injection demonstrated persistent patency of the left-sided percutaneous nephrostomy catheter tract. Successful fluoroscopic guided recanalization of the left-sided nephrostomy catheter track allowing placement of a new 12 French percutaneous drainage catheter with end coiled end locked within the left renal pelvis. Successful fluoroscopic guided exchange and up sizing of new now 63 French right-sided percutaneous nephrostomy catheter with end coiled and locked within the right renal pelvis. IMPRESSION: 1. Successful fluoroscopic guided recanalization of the left-sided nephrostomy catheter track allowing placement of a new 12 French percutaneous drainage catheter with and coiled end locked within the left renal pelvis. 2. Successful fluoroscopic guided exchange and up sizing of new now 73 French right-sided percutaneous nephrostomy catheter with end coiled and locked within the right renal pelvis. 3. Urine sample capped and sent to the laboratory for analysis. Electronically Signed   By: Sandi Mariscal M.D.   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 COMPLICATIONS: None PROCEDURE: Informed  written consent was obtained from the patient after a thorough discussion of the procedural risks, benefits  and alternatives. All questions were addressed. Maximal Sterile Barrier Technique was utilized including caps, mask, sterile gowns, sterile gloves, sterile drape, hand hygiene and skin antiseptic. A timeout was performed prior to the initiation of the procedure. Patient position right decubitus and scout CT was obtained. Patient is prepped and draped in usual sterile fashion. 1% lidocaine was used for local anesthesia. Using CT guidance, modified Seldinger technique was used to place a 10 [place] drain into the presacral space. Approximately 3-4 cc of serosanguineous fluid aspirated and sent for culture. Patient tolerated the procedure well and remained hemodynamically stable throughout. No complications were encountered and no significant blood loss. IMPRESSION: Status post CT-guided drainage of presacral abscess. Signed, Dulcy Fanny. Earleen Newport, DO [facility] Electronically Signed   By: Corrie Mckusick D.O.   On: 09/16/2017 13:16   Ir Picc Placement Right >5 Yrs Inc Img Guide  Result Date: 10/12/2017 INDICATION: Poor venous access. Request PICC line placement for prolonged IV therapies. EXAM: RIGHT UPPER EXTREMITY PICC LINE PLACEMENT WITH ULTRASOUND AND FLUOROSCOPIC GUIDANCE MEDICATIONS: None; ANESTHESIA/SEDATION: Moderate Sedation Time:  None The patient was continuously monitored during the procedure by the interventional radiology nurse under my direct supervision. FLUOROSCOPY TIME:  Fluoroscopy Time: 36 seconds COMPLICATIONS: None immediate. PROCEDURE: The patient was advised of the possible risks and complications and agreed to undergo the procedure. The patient was then brought to the angiographic suite for the procedure. The right arm was prepped with chlorhexidine, draped in the usual sterile fashion using maximum barrier technique (cap and mask, sterile gown, sterile gloves, large sterile sheet, hand hygiene and cutaneous antiseptic). Local anesthesia  was attained by infiltration with 1% lidocaine. Ultrasound demonstrated patency of the basilic vein, and this was documented with an image. Under real-time ultrasound guidance, this vein was accessed with a 21 gauge micropuncture needle and image documentation was performed. The needle was exchanged over a guidewire for a peel-away sheath through which a 35 cm 5 [place] dual lumen power injectable PICC was advanced, and positioned with its tip at the lower SVC/right atrial junction. Fluoroscopy during the procedure and fluoro spot radiograph confirms appropriate catheter position. The catheter was flushed, secured to the skin, and covered with a sterile dressing. IMPRESSION: Successful placement of a right arm PICC with sonographic and fluoroscopic guidance. The catheter is ready for use. Read by: Ascencion Dike PA-C Electronically Signed   By: Jerilynn Mages.  Shick M.D.   On: 10/12/2017 16:33      Discharge Exam: Vitals:   10/13/17 0715 10/13/17 1300  BP: 135/78 (!) 143/63  Pulse:  70  Resp: 17 17  Temp: 97.8 F (36.6 C) 98.1 F (36.7 C)  SpO2: 98% 100%    General: Pt is alert, awake, not in acute distress Cardiovascular: RRR, S1/S2 +, no rubs, no gallops Respiratory: CTA bilaterally, no wheezing, no rhonchi Abdominal: Soft, NT, ND, bowel sounds +, bilateral nephrostomy tubes and ostomy present  Extremities: no edema, no cyanosis    The results of significant diagnostics from this hospitalization (including imaging, microbiology, ancillary and laboratory) are listed below for reference.     Microbiology: No results found for this or any previous visit (from the past 240 hour(s)).   Labs: BNP (last 3 results) No results for input(s): BNP in the last 8760 hours. Basic Metabolic Panel: Recent Labs  Lab 10/09/17 0845 10/10/17 0420 10/10/17 0425 10/11/17 0426 10/12/17  0500 10/13/17 0554  NA 137 134*  --  135 136 135  K 4.4 4.4  --  4.4 4.4 4.2  CL 101 101  --  103 103 103  CO2 25 24  --   21* 23 21*  GLUCOSE 95 111*  --  106* 109* 106*  BUN 27* 32*  --  33* 39* 43*  CREATININE 1.49*  1.44* 1.40*  --  1.53* 1.40* 1.25*  CALCIUM 8.5* 8.7*  --  8.8* 9.2 9.3  MG 2.0  --  1.4* 1.4* 2.0 1.5*  PHOS 4.1 4.6  --  4.4 5.2* 5.5*   Liver Function Tests: Recent Labs  Lab 10/09/17 0845 10/10/17 0420 10/11/17 0426 10/12/17 0500 10/13/17 0554  ALBUMIN 2.2* 2.3* 2.3* 2.5* 2.7*   No results for input(s): LIPASE, AMYLASE in the last 168 hours. No results for input(s): AMMONIA in the last 168 hours. CBC: Recent Labs  Lab 10/09/17 0845 10/10/17 0425 10/11/17 0550 10/12/17 0500 10/13/17 0554  WBC 6.5 5.7 61.9* 15.6* 6.9  HGB 9.7* 9.8* 9.1* 9.4* 9.5*  HCT 32.0* 31.4* 30.6* 30.1* 30.3*  MCV 95.2 93.7 94.7 95.0 94.7  PLT 262 243 238 238 254   Cardiac Enzymes: Recent Labs  Lab 10/10/17 0425 10/12/17 0500  CKTOTAL 202 138   BNP: Invalid input(s): POCBNP CBG: Recent Labs  Lab 10/12/17 1326 10/12/17 1758 10/12/17 2232 10/13/17 0814 10/13/17 1301  GLUCAP 169* 89 121* 102* 108*   D-Dimer No results for input(s): DDIMER in the last 72 hours. Hgb A1c No results for input(s): HGBA1C in the last 72 hours. Lipid Profile No results for input(s): CHOL, HDL, LDLCALC, TRIG, CHOLHDL, LDLDIRECT in the last 72 hours. Thyroid function studies No results for input(s): TSH, T4TOTAL, T3FREE, THYROIDAB in the last 72 hours.  Invalid input(s): FREET3 Anemia work up No results for input(s): VITAMINB12, FOLATE, FERRITIN, TIBC, IRON, RETICCTPCT in the last 72 hours. Urinalysis    Component Value Date/Time   COLORURINE YELLOW 10/02/2017 0836   APPEARANCEUR CLOUDY (A) 10/02/2017 0836   LABSPEC 1.025 10/02/2017 0836   LABSPEC 1.015 11/17/2015 1436   PHURINE 5.5 10/02/2017 0836   GLUCOSEU NEGATIVE 10/02/2017 0836   GLUCOSEU 2,000 11/17/2015 1436   HGBUR MODERATE (A) 10/02/2017 0836   BILIRUBINUR NEGATIVE 10/02/2017 0836   BILIRUBINUR neg 08/30/2016 1025   BILIRUBINUR Negative  11/17/2015 1436   KETONESUR 15 (A) 10/02/2017 0836   PROTEINUR >300 (A) 10/02/2017 0836   UROBILINOGEN negative 08/30/2016 1025   UROBILINOGEN 0.2 11/17/2015 1436   NITRITE NEGATIVE 10/02/2017 0836   LEUKOCYTESUR LARGE (A) 10/02/2017 0836   LEUKOCYTESUR Moderate 11/17/2015 1436   Sepsis Labs Invalid input(s): PROCALCITONIN,  WBC,  LACTICIDVEN Microbiology No results found for this or any previous visit (from the past 240 hour(s)).   Patient was seen and examined on the day of discharge and was found to be in stable condition. Time coordinating discharge: 35 minutes including assessment and coordination of care, as well as examination of the patient.   SIGNED:  Dessa Phi, DO [facility] Pager [phone]  If 7PM-7AM, please contact night-coverage [email or web address] Password [email or web address] 10/13/2017, 1:09 PM

## 2017-10-12 NOTE — Consult Note (Addendum)
[place] Nurse ostomy follow up PT following for sacral wound needs. I made visit this am to assess if ostomy needs were present.  Patient states her ostomy is good at the moment but she and her husband are nervous and would like to change it.  Her husband can not be here until tonight but could be here at 0730 tomorrow AM. She requests assistance then.  I looked through supplies to make sure all items are present. No supplies needed at this time. Will try to coordinate a visit for early tomorrow morning. Will order ostomy supplies so we do not continue to use all of patient's supplies that she has brought from home. Ordered 5 pouches, 1 piece convex Kellie Simmering # [phone])  and 5 barrier rings Kellie Simmering # [phone]). Will return in am at 0730 to assist patient's husband with pouch change.   [place] Nurse wound follow up Wound type: pressure, unstageable, POA Measurement: 3 wounds now with healed tissue between wounds. Left proximal medial buttock 4cm x 2cm x 0.1cm 15% pink granulating tissue, 85% yellow fibrin Right proximal medial buttock 5cm x 4cm x 0.1cm 90% pink granulating 10% yellow fibrin Right buttock 4cm x 4cm 90% pink granulating 10% yellow fibrin Wound bed: see above Drainage (amount, consistency, odor) scant Periwound: intact Dressing procedure/placement/frequency: Hydrotherapy to perform Hydro one more day and then patient will be discharged home.  Have ordered ostomy supplies so we do not continue to use all of patient's supplies that she has brought from home. Ordered 5 pouches, 1 piece convex Kellie Simmering # [phone])  and 5 barrier rings Kellie Simmering # [phone]). Will return in am at 0730 to assist patient's husband with pouch change prior to discharge.   Fara Olden, RN-C, WTA-C, [facility] Treatment Associate Ostomy Care Associate

## 2017-10-12 NOTE — Care Management Note (Addendum)
Case Management Note  Patient Details  Name: [name] MRN: [id number] Date of Birth: 05-Mar-1956  Subjective/Objective:     Pt admitted with  AMS and AKI          Action/Plan:   PTA from [facility] - pt admitted to [facility] with ostomy and 2 nephrostomy tubes.  CSW following for placement.     Expected Discharge Date:  10/12/17               Expected Discharge Plan:  Skilled Nursing Facility(From [facility] SNF)  In-House Referral:  Clinical Social Work  Discharge planning Services  CM Consult  Post Acute Care Choice:  Home Health Choice offered to:  Patient  DME Arranged:    DME Agency:     HH Arranged:  RN, PT, OT HH Agency:     Status of Service:  In process, will continue to follow  If discussed at Long Length of Stay Meetings, dates discussed:    Additional Comments: 10/12/2017  Update 1640 : CM informed by [facility] that agency can no longer accept pt.  CM communicated referrals to [place] - both declined pt.  CM left [facility]icemail for [facility].  CM informed pt that [facility] will not provide HH.  1200: [facility] was unable to [facility] due to constraints with resources.   Pt states she has no other preference for [facility] agency - instructed CM to contact local agencies and inquire if acceptance could be gained.  CM contacted [facility] and [place] referral was accepted, pt is in agreement with [place] with infusion company contracted through [facility] to meet with pt at bedside today.   CM informed by OT that pt will require at least one additional hydrotherapy session to be performed tomorrow.  CM contacted attending to inform of discharge barrier.  Pt to have PICC line placed today for PRN IV fluids in the home.   Pt confirmed that she will have 24/7 supervision at discharge provided by family, she has a rollator in the home and denied needing any additional equipment.  Pt confirmed she has active PCP and nephrologist in the local area.   CM contacted by  attending - plan is for pt to discharge home with home health RN for wound managment and PRN IV fluids dependent upon ostomy output. CM discussed recommendation with pt - pt in agreement - pt informed CM that she would like to use [facility] (agency has been utilized in the past in similar circumstances). [facility] reviewing case to determine if pt can be accepted  10/11/17 Pt continues to require IV Mag replacement and IV fluids.  Pt is also actively receiving hydrotherapy based on daily assessments [name], RN 10/12/2017, 4:59 PM

## 2017-10-13 ENCOUNTER — Other Ambulatory Visit: Payer: Self-pay | Admitting: Hematology and Oncology

## 2017-10-13 ENCOUNTER — Telehealth: Payer: Self-pay

## 2017-10-13 DIAGNOSIS — R278 Other lack of coordination: Secondary | ICD-10-CM | POA: Diagnosis not present

## 2017-10-13 DIAGNOSIS — Z8543 Personal history of malignant neoplasm of ovary: Secondary | ICD-10-CM | POA: Diagnosis not present

## 2017-10-13 DIAGNOSIS — N6489 Other specified disorders of breast: Secondary | ICD-10-CM | POA: Diagnosis not present

## 2017-10-13 DIAGNOSIS — Z452 Encounter for adjustment and management of vascular access device: Secondary | ICD-10-CM | POA: Diagnosis not present

## 2017-10-13 DIAGNOSIS — T82594D Other mechanical complication of infusion catheter, subsequent encounter: Secondary | ICD-10-CM | POA: Diagnosis not present

## 2017-10-13 DIAGNOSIS — G894 Chronic pain syndrome: Secondary | ICD-10-CM | POA: Diagnosis not present

## 2017-10-13 DIAGNOSIS — M6281 Muscle weakness (generalized): Secondary | ICD-10-CM | POA: Diagnosis not present

## 2017-10-13 DIAGNOSIS — L89152 Pressure ulcer of sacral region, stage 2: Secondary | ICD-10-CM | POA: Diagnosis not present

## 2017-10-13 DIAGNOSIS — N183 Chronic kidney disease, stage 3 (moderate): Secondary | ICD-10-CM | POA: Diagnosis not present

## 2017-10-13 DIAGNOSIS — Z170 Estrogen receptor positive status [ER+]: Principal | ICD-10-CM

## 2017-10-13 DIAGNOSIS — E119 Type 2 diabetes mellitus without complications: Secondary | ICD-10-CM | POA: Diagnosis not present

## 2017-10-13 DIAGNOSIS — D649 Anemia, unspecified: Secondary | ICD-10-CM | POA: Diagnosis not present

## 2017-10-13 DIAGNOSIS — E440 Moderate protein-calorie malnutrition: Secondary | ICD-10-CM | POA: Diagnosis not present

## 2017-10-13 DIAGNOSIS — E1122 Type 2 diabetes mellitus with diabetic chronic kidney disease: Secondary | ICD-10-CM | POA: Diagnosis not present

## 2017-10-13 DIAGNOSIS — R52 Pain, unspecified: Secondary | ICD-10-CM | POA: Diagnosis not present

## 2017-10-13 DIAGNOSIS — I10 Essential (primary) hypertension: Secondary | ICD-10-CM | POA: Diagnosis not present

## 2017-10-13 DIAGNOSIS — E039 Hypothyroidism, unspecified: Secondary | ICD-10-CM | POA: Diagnosis not present

## 2017-10-13 DIAGNOSIS — R911 Solitary pulmonary nodule: Secondary | ICD-10-CM | POA: Diagnosis not present

## 2017-10-13 DIAGNOSIS — C50212 Malignant neoplasm of upper-inner quadrant of left female breast: Secondary | ICD-10-CM

## 2017-10-13 DIAGNOSIS — N182 Chronic kidney disease, stage 2 (mild): Secondary | ICD-10-CM | POA: Diagnosis not present

## 2017-10-13 DIAGNOSIS — N179 Acute kidney failure, unspecified: Secondary | ICD-10-CM | POA: Diagnosis not present

## 2017-10-13 DIAGNOSIS — M7989 Other specified soft tissue disorders: Secondary | ICD-10-CM | POA: Diagnosis not present

## 2017-10-13 LAB — GLUCOSE, CAPILLARY
GLUCOSE-CAPILLARY: 108 mg/dL — AB (ref 65–99)
Glucose-Capillary: 102 mg/dL — ABNORMAL HIGH (ref 65–99)

## 2017-10-13 LAB — RENAL FUNCTION PANEL
Albumin: 2.7 g/dL — ABNORMAL LOW (ref 3.5–5.0)
Anion gap: 11 (ref 5–15)
BUN: 43 mg/dL — AB (ref 6–20)
CHLORIDE: 103 mmol/L (ref 101–111)
CO2: 21 mmol/L — ABNORMAL LOW (ref 22–32)
Calcium: 9.3 mg/dL (ref 8.9–10.3)
Creatinine, Ser: 1.25 mg/dL — ABNORMAL HIGH (ref 0.44–1.00)
GFR calc Af Amer: 52 mL/min — ABNORMAL LOW (ref >60)
GFR calc non Af Amer: 45 mL/min — ABNORMAL LOW (ref >60)
GLUCOSE: 106 mg/dL — AB (ref 65–99)
POTASSIUM: 4.2 mmol/L (ref 3.5–5.1)
Phosphorus: 5.5 mg/dL — ABNORMAL HIGH (ref 2.5–4.6)
Sodium: 135 mmol/L (ref 135–145)

## 2017-10-13 LAB — CBC
HEMATOCRIT: 30.3 % — AB (ref 36.0–46.0)
HEMOGLOBIN: 9.5 g/dL — AB (ref 12.0–15.0)
MCH: 29.7 pg (ref 26.0–34.0)
MCHC: 31.4 g/dL (ref 30.0–36.0)
MCV: 94.7 fL (ref 78.0–100.0)
Platelets: 254 10*3/uL (ref 150–400)
RBC: 3.2 MIL/uL — ABNORMAL LOW (ref 3.87–5.11)
RDW: 16.7 % — ABNORMAL HIGH (ref 11.5–15.5)
WBC: 6.9 10*3/uL (ref 4.0–10.5)

## 2017-10-13 LAB — MAGNESIUM: Magnesium: 1.5 mg/dL — ABNORMAL LOW (ref 1.7–2.4)

## 2017-10-13 MED ORDER — HYDROMORPHONE HCL 2 MG PO TABS: 2.0000 mg | ORAL_TABLET | ORAL | 0 refills | Status: DC | PRN

## 2017-10-13 MED ORDER — ALPRAZOLAM 0.5 MG PO TABS: 0.5000 mg | ORAL_TABLET | Freq: Three times a day (TID) | ORAL | 0 refills | Status: DC | PRN

## 2017-10-13 MED ORDER — MAGNESIUM SULFATE 2 GM/50ML IV SOLN
2.0000 g | Freq: Once | INTRAVENOUS | Status: AC
  Administered 2017-10-13: 2 g via INTRAVENOUS
  Filled 2017-10-13: qty 50

## 2017-10-13 MED ORDER — FENTANYL 50 MCG/HR TD PT72: 50.0000 ug | MEDICATED_PATCH | TRANSDERMAL | 0 refills | Status: AC

## 2017-10-13 NOTE — Progress Notes (Signed)
Clinical Social Worker facilitated patient discharge including contacting patient family and facility to confirm patient discharge plans.  Clinical information faxed to facility and family agreeable with plan.  CSW arranged ambulance transport via [facility] to [facility]  .  RN to call [phone] (pt will be placed in [facility]) for report prior to discharge.  Clinical Social Worker will sign off for now as social work intervention is no longer needed. Please consult [place] again if new need arises.  Rhea Pink, MSW, [place]

## 2017-10-13 NOTE — Consult Note (Signed)
[place] Nurse ostomy follow up Stoma type/location: right abd  Stomal assessment/size: 1" not up to skin level Peristomal assessment: red, macerated Treatment options for stomal/peristomal skin: [place] (product pt brought from home) ostomy paste Output liquid yellow/green effluent Ostomy pouching: 1pc convex, 2 barrier rings, reinforced at 3 and 9 o'clock Education provided: none needed, patient's husband changed the pouch, I was present for moral support. They may go home today instead of to a facility. Anxious about all the plans that need to be worked out.  Enrolled patient in [place] Start Discharge program: No  WOC Nurse wound follow up Wound type:pressure Saw wound yesterday, hydrotherapy to be performed one last time today prior to discharge.  We will follow this patient while she remains in house, and remain available to this patient, nursing, and the medical and surgical teams.  Fara Olden, RN-C, WTA-C, [place] Wound Treatment Associate Ostomy Care Associate

## 2017-10-13 NOTE — Progress Notes (Signed)
Physical Therapy Wound Treatment Patient Details  Name: [name] MRN: [id number] Date of Birth: 09/20/1955  Today's Date: 10/13/2017 Time: 1115-1205 Time Calculation (min): 50 min  Subjective  Subjective: Pt frustrated with insurance and being told that they would not pay for [facility] services Patient and Family Stated Goals: to heal   Pain Score: Pt premedicated prior to treatment. Pt with 8/10 pain during debridement otherwise 2/10 pain   Wound Assessment     Pressure Injury 10/12/17 Stage III -  Full thickness tissue loss. Subcutaneous fat may be visible but bone, tendon or muscle are NOT exposed. Most Lateral  R sacral wound  (Active)  Wound Image   10/12/2017 11:00 AM  Dressing Type Foam 10/13/2017  1:00 PM  Dressing Changed 10/13/2017  1:00 PM  Dressing Change Frequency Daily 10/13/2017  1:00 PM  State of Healing Fully granulated 10/13/2017  1:00 PM  Site / Wound Assessment Red;Granulation tissue;Pink 10/13/2017  1:00 PM  % Wound base Red or Granulating 90% 10/13/2017  1:00 PM  % Wound base Yellow/Fibrinous Exudate 10% 10/13/2017  1:00 PM  Peri-wound Assessment Erythema (blanchable);Pink;Maceration 10/13/2017  1:00 PM  Wound Length (cm) 4 cm 10/12/2017 11:00 AM  Wound Width (cm) 4 cm 10/12/2017 11:00 AM  Wound Surface Area (cm^2) 16 cm^2 10/12/2017 11:00 AM  Margins Unattached edges (unapproximated) 10/13/2017  1:00 PM  Drainage Amount Scant 10/13/2017  1:00 PM  Drainage Description Serous 10/13/2017  1:00 PM  Treatment Cleansed;Packing (Saline gauze) 10/12/2017 11:00 AM     Pressure Injury 10/12/17 Stage III -  Full thickness tissue loss. Subcutaneous fat may be visible but bone, tendon or muscle are NOT exposed. Medial R sacral wound (Active)  Wound Image   10/12/2017 11:00 AM  Dressing Type Gauze (Comment);Moist to dry 10/13/2017  1:00 PM  Dressing Changed 10/13/2017  1:00 PM  Dressing Change Frequency Daily 10/13/2017  1:00 PM  State of Healing Early/partial granulation 10/13/2017  1:00 PM    Site / Wound Assessment Yellow;Red;Pink;Clean 10/13/2017  1:00 PM  % Wound base Red or Granulating 90% 10/13/2017  1:00 PM  % Wound base Yellow/Fibrinous Exudate 10% 10/13/2017  1:00 PM  Peri-wound Assessment Erythema (blanchable);Pink 10/13/2017  1:00 PM  Wound Length (cm) 5 cm 10/12/2017 11:00 AM  Wound Width (cm) 4 cm 10/12/2017 11:00 AM  Wound Surface Area (cm^2) 20 cm^2 10/12/2017 11:00 AM  Margins Unattached edges (unapproximated) 10/13/2017  1:00 PM  Drainage Amount Minimal 10/13/2017  1:00 PM  Drainage Description Serosanguineous 10/13/2017  1:00 PM  Treatment Packing (Saline gauze) 10/13/2017  1:00 PM     Pressure Injury 10/12/17 Stage III -  Full thickness tissue loss. Subcutaneous fat may be visible but bone, tendon or muscle are NOT exposed. Central Sacral Wound  (Active)  Wound Image   10/12/2017 11:00 AM  Dressing Type Gauze (Comment);Moist to dry 10/13/2017  1:00 PM  Dressing Changed 10/13/2017  1:00 PM  Dressing Change Frequency Daily 10/13/2017  1:00 PM  State of Healing Early/partial granulation 10/13/2017  1:00 PM  Site / Wound Assessment Yellow;Pink 10/13/2017  1:00 PM  % Wound base Red or Granulating 10% 10/13/2017  1:00 PM  % Wound base Yellow/Fibrinous Exudate 90% 10/13/2017  1:00 PM  Peri-wound Assessment Maceration;Pink;Erythema (blanchable) 10/13/2017  1:00 PM  Wound Length (cm) 4 cm 10/12/2017 11:00 AM  Wound Width (cm) 2 cm 10/12/2017 11:00 AM  Wound Surface Area (cm^2) 8 cm^2 10/12/2017 11:00 AM  Margins Unattached edges (unapproximated) 10/13/2017  1:00 PM  Drainage  Amount Moderate 10/13/2017  1:00 PM  Drainage Description Purulent;Odor;Serosanguineous 10/13/2017  1:00 PM  Treatment Debridement (Selective);Hydrotherapy (Pulse lavage);Packing (Saline gauze) 10/13/2017  1:00 PM   Santyl applied to wound bed prior to dressing   Hydrotherapy Pulsed lavage therapy - wound location: sacrum(central sacral wound) Pulsed Lavage with Suction (psi): 4 psi(due to pain) Pulsed Lavage with  Suction - Normal Saline Used: 1000 mL Pulsed Lavage Tip: Tip with splash shield Selective Debridement Selective Debridement - Location: central sacral wound Selective Debridement - Tools Used: Scalpel;Forceps Selective Debridement - Tissue Removed: necrotic, yellow slough   Wound Assessment and Plan  Wound Therapy - Assess/Plan/Recommendations Wound Therapy - Clinical Statement: Central wound is showing more red underneath webbed slough material which continues to be tightly adhered, agressive debridement and small amount of slough was removed. medial and lateral wound continue to heal. Pt requested PT educate her on different types of treatment and dressing used on her wounds.  Wound Therapy - Functional Problem List: Decreased sitting tolerance Factors Delaying/Impairing Wound Healing: Diabetes Mellitus;Immobility Hydrotherapy Plan: Debridement;Dressing change;Patient/family education;Pulsatile lavage with suction Wound Therapy - Frequency: 6X / week Wound Therapy - Follow Up Recommendations: Home health RN Wound Plan: see above  Wound Therapy Goals- Improve the function of patient's integumentary system by progressing the wound(s) through the phases of wound healing (inflammation - proliferation - remodeling) by: Decrease Necrotic Tissue to: 10% Decrease Necrotic Tissue - Progress: Partly met Increase Granulation Tissue to: 90% Increase Granulation Tissue - Progress: Partly met Goals/treatment plan/discharge plan were made with and agreed upon by patient/family: Yes Time For Goal Achievement: 7 days Wound Therapy - Potential for Goals: Good  Goals will be updated until maximal potential achieved or discharge criteria met.  Discharge criteria: when goals achieved, discharge from hospital, MD decision/surgical intervention, no progress towards goals, refusal/missing three consecutive treatments without notification or medical reason.  GP    Dani Gobble. Migdalia Dk PT, DPT Acute  Rehabilitation  [phone] Pager [phone]   [place] 10/13/2017, 1:40 PM

## 2017-10-13 NOTE — Progress Notes (Signed)
Explained and discussed discharge instructions with patient and husband. Pt going to [facility]) via [place]. Antibiotic given via PICC prior to going to [facility]. Report called. Husband taking belongings to [facility].

## 2017-10-13 NOTE — Progress Notes (Signed)
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 Pt [facility]'d from [facility] on 2/24 after >1 month hospitalization. Pt readmitted to [facility] on 10/02/2017 with pain, delirium and acute renal failure; found to have displacement of lt nephrostomy tube and exchange of rt nephrostomy tube.    OT comments  Pt progressing towards OT goals. Pt able to demonstrate transfers at mod I level and sink level grooming at min guard with RW. Pt with good awareness of lines and management of them throughout. Pt's husband very very thankful and appreciative of the care she received while on 2C. Pt set to discharge today to SN[name] via [facility]. SNF placement remains appropriate and should focus on energy conservation, activity tolerance, safety during bathroom transfers and ADL.   Follow Up Recommendations  SNF;Supervision/Assistance - 24 hour    Equipment Recommendations  Other (comment)(defer to next venue of care)    Recommendations for Other Services      Precautions / Restrictions Precautions Precautions: Fall;Other (comment) Precaution Comments: bilateral nephrostomy tubes, colostomy, sacral wound Restrictions Weight Bearing Restrictions: No       Mobility Bed Mobility Overal bed mobility: Independent             General bed mobility comments: Increased time and effort to organize drains/tubes with bed mobility, but no physical assist required  Transfers Overall transfer level: Modified independent Equipment used: Rolling walker (2 wheeled)              General transfer comment: vc for safe hand placement    Balance Overall balance assessment: Needs assistance Sitting-balance support: Feet supported Sitting balance-Leahy Scale: Fair Sitting balance - Comments: managed nephrostomy tubes and lines EOB   Standing balance support: Bilateral upper extremity supported Standing balance-Leahy Scale: Poor Standing balance comment: Reliant on UE support                           ADL either performed or assessed with clinical judgement   ADL Overall ADL's : Needs assistance/impaired     Grooming: Wash/dry face;Wash/dry hands;Min guard;Standing Grooming Details (indicate cue type and reason): leans against sink                 Toilet Transfer: Modified International aid/development worker Details (indicate cue type and reason): simulated through transfers throughout the room         Functional mobility during ADLs: Min guard;Rolling walker General ADL Comments: Pt motivated and wanted to walk     Vision       Perception     Praxis      Cognition Arousal/Alertness: Awake/alert Behavior During Therapy: Anxious Overall Cognitive Status: Impaired/Different from baseline Area of Impairment: Awareness;Problem solving;Attention                           Awareness: Emergent Problem Solving: Requires verbal cues General Comments: Pt disapointed as she was finally excited to go home, and then with everything falling through, she was sad about it. BUt at the end she states that she is hopeful  Exercises     Shoulder Instructions       General Comments LOTs of family present today    Pertinent Vitals/ Pain       Pain Assessment: Faces Faces Pain Scale: Hurts a little bit Pain Location: R posterior drain incision site Pain Descriptors / Indicators: Grimacing;Guarding Pain Intervention(s): Monitored during session;Repositioned  Home Living                                           Prior Functioning/Environment              Frequency  Min 2X/week        Progress Toward Goals  OT Goals(current goals can now be found in the care plan section)  Progress towards OT goals: Progressing toward goals  Acute Rehab OT Goals Patient Stated Goal: To feel better OT Goal Formulation: With patient/family Time For Goal Achievement: 10/17/17 Potential to Achieve Goals: Good  Plan Discharge plan remains appropriate;Frequency remains appropriate    Co-evaluation                 AM-PAC PT "6 Clicks" Daily Activity     Outcome Measure   Help from another person eating meals?: A Little Help from another person taking care of personal grooming?: A Little Help from another person toileting, which includes using toliet, bedpan, or urinal?: A Little Help from another person bathing (including washing, rinsing, drying)?: A Little Help from another person to put on and taking off regular upper body clothing?: A Little Help from another person to put on and taking off regular lower body clothing?: A Little 6 Click Score: 18    End of Session Equipment Utilized During Treatment: Rolling walker  OT Visit Diagnosis: Unsteadiness on feet (R26.81);Muscle weakness (generalized) (M62.81);Other symptoms and signs involving cognitive function;Pain Pain - Right/Left: Right Pain - part of body: (at drain site)   Activity Tolerance Patient tolerated treatment well   Patient Left in bed;with call bell/phone within reach;with family/visitor present   Nurse Communication Mobility status        Time: 2563-8937 OT Time Calculation (min): 18 min  Charges: OT General Charges $OT Visit: 1 Visit OT Treatments $Self Care/Home Management : 8-22 mins  [name] OTR/L [place] 10/13/2017, 4:54 PM

## 2017-10-13 NOTE — Clinical Social Work Placement (Signed)
   CLINICAL SOCIAL WORK PLACEMENT  NOTE  Date:  10/13/2017  Patient Details  Name: [name] MRN: [id number] Date of Birth: 1955/09/01  Clinical Social Work is seeking post-discharge placement for this patient at the   level of care (*CSW will initial, date and re-position this form in  chart as items are completed):      Patient/family provided with [place] Work Department's list of facilities offering this level of care within the geographic area requested by the patient (or if unable, by the patient's family).      Patient/family informed of their freedom to choose among providers that offer the needed level of care, that participate in Medicare, Medicaid or managed care program needed by the patient, have an available bed and are willing to accept the patient.      Patient/family informed of [place]'s ownership interest in [facility] and [facility], as well as of the fact that they are under no obligation to receive care at these facilities.  PASRR submitted to EDS on       PASRR number received on       Existing PASRR number confirmed on       FL2 transmitted to all facilities in geographic area requested by pt/family on       FL2 transmitted to all facilities within larger geographic area on       Patient informed that his/her managed care company has contracts with or will negotiate with certain facilities, including the following:            Patient/family informed of bed offers received.  Patient chooses bed at [facility]     Physician recommends and patient chooses bed at      Patient to be transferred to [facility] on 10/13/17.  Patient to be transferred to facility by [facility]     Patient family notified on 10/13/17 of transfer.  Name of family member notified:  Marijo Sanes at bedside     PHYSICIAN       Additional Comment:    _______________________________________________ Wende Neighbors, LCSW 10/13/2017, 2:37 PM

## 2017-10-13 NOTE — Care Management Note (Addendum)
Case Management Note  Patient Details  Name: [name] MRN: [id number] Date of Birth: 24-Mar-1956  Subjective/Objective:     Pt admitted with  AMS and AKI          Action/Plan:   PTA from [facility] - pt admitted to [facility] with ostomy and 2 nephrostomy tubes.  CSW following for placement.     Expected Discharge Date:  10/12/17               Expected Discharge Plan:  Skilled Nursing Facility(From [facility] SNF)  In-House Referral:  Clinical Social Work  Discharge planning Services  CM Consult  Post Acute Care Choice:  Home Health Choice offered to:  Patient  DME Arranged:    DME Agency:     HH Arranged:  RN, PT, OT HH Agency:     Status of Service:  In process, will continue to follow  If discussed at Long Length of Stay Meetings, dates discussed:    Additional Comments: 10/13/2017  CM discussed [place] discharge barriers with Bess Kinds RN CM with [employer] - liaison recommends SNF - liaison also confirmed that pt has been approved for SNF stay.  Pt in agreement to discharge back to [facility].  CSW made aware  Pt has been declined for HH by:  [facility], [facility], [facility], [facility], [facility], [facility], [facility], [facility].  Explanations for refusal of referral acceptance range from; insurance not in network, payment concerns with current coverage, lack of resources to support [facility] needs.   CM also attempted to give referral with pt privately paying - agencies also refused.  Pt dicsussed in LOS 3/14 - recommendation is for pt to return to SNF.  CM discussed barriers with discharging with home health with pt and attending.    10/12/2017 Update 1640 : CM informed by [facility] that agency can no longer accept pt.  CM communicated referrals to [facility] - both declined pt.  CM left voicemail for [facility] and [facility].  CM informed pt that [facility] will not provide HH.  1200: [facility] was unable to accept pt due to constraints with resources.   Pt states she  has no other preference for [facility] agency - instructed CM to contact local agencies and inquire if acceptance could be gained.  CM contacted [facility] referral was accepted, pt is in agreement with [place] with infusion company contracted through [facility] to meet with pt at bedside today.   CM informed by OT that pt will require at least one additional hydrotherapy session to be performed tomorrow.  CM contacted attending to inform of discharge barrier.  Pt to have PICC line placed today for PRN IV fluids in the home.   Pt confirmed that she will have 24/7 supervision at discharge provided by family, she has a rollator in the home and denied needing any additional equipment.  Pt confirmed she has active PCP and nephrologist in the local area.   CM contacted by attending - plan is for pt to discharge home with home health RN for wound managment and PRN IV fluids dependent upon ostomy output. CM discussed recommendation with pt - pt in agreement - pt informed CM that she would like to use [facility] (agency has been utilized in the past in similar circumstances). [facility] reviewing case to determine if pt can be accepted  10/11/17 Pt continues to require IV Mag replacement and IV fluids.  Pt is also actively receiving hydrotherapy  based on daily assessments [name], RN 10/13/2017, 10:16 AM

## 2017-10-13 NOTE — Plan of Care (Signed)
Continue current care plan 

## 2017-10-13 NOTE — Telephone Encounter (Signed)
[facility] hospital called to arrange appt with Dr. Alvy Bimler. She is being discharged today and needs to follow up with Dr. Alvy Bimler.

## 2017-10-13 NOTE — Telephone Encounter (Signed)
I ordered breast biopsy under US guidance. Can you coordinate that with Dawn/breast navigators to make sure it gets done? Also, let me know when it is scheduled so I can order rtn visit

## 2017-10-17 ENCOUNTER — Telehealth: Payer: Self-pay | Admitting: *Deleted

## 2017-10-17 ENCOUNTER — Other Ambulatory Visit: Payer: Self-pay | Admitting: *Deleted

## 2017-10-17 DIAGNOSIS — Z170 Estrogen receptor positive status [ER+]: Principal | ICD-10-CM

## 2017-10-17 DIAGNOSIS — C50212 Malignant neoplasm of upper-inner quadrant of left female breast: Secondary | ICD-10-CM

## 2017-10-17 NOTE — Telephone Encounter (Signed)
Spoke to pt regarding appt after [place] and bx. Pt would like midmorning appt. Pt scheduled and confirmed for 9:45lab-1015 Dr. Alvy Bimler. Informed pt that if bx is needed and availability to have performed, will be done on same day as bx (3/20). Denies further needs at this time.

## 2017-10-17 NOTE — Telephone Encounter (Signed)
Called [facility] to assist in coordinating [place] bx to left chest wall. Per [facility] orders for left breast/chest wall and axilla needed to be places. Orders placed as directed. Requested [facility] to call husband to schedule appt for [place] and bx d/t pt being at [facility]. Received verbal understanding. Ensured numbers for husband were correct.

## 2017-10-17 NOTE — Telephone Encounter (Signed)
Left vm regarding f/u with Dr. Alvy Bimler after US/bx. Contact information provided.

## 2017-10-18 DIAGNOSIS — M6281 Muscle weakness (generalized): Secondary | ICD-10-CM | POA: Diagnosis not present

## 2017-10-18 DIAGNOSIS — G894 Chronic pain syndrome: Secondary | ICD-10-CM | POA: Diagnosis not present

## 2017-10-19 ENCOUNTER — Other Ambulatory Visit: Payer: Self-pay | Admitting: Hematology and Oncology

## 2017-10-19 ENCOUNTER — Inpatient Hospital Stay: Admission: RE | Admit: 2017-10-19 | Payer: Self-pay | Source: Ambulatory Visit

## 2017-10-19 ENCOUNTER — Encounter: Payer: Self-pay | Admitting: Family

## 2017-10-19 DIAGNOSIS — D649 Anemia, unspecified: Secondary | ICD-10-CM | POA: Diagnosis not present

## 2017-10-19 DIAGNOSIS — N182 Chronic kidney disease, stage 2 (mild): Secondary | ICD-10-CM | POA: Diagnosis not present

## 2017-10-19 DIAGNOSIS — N179 Acute kidney failure, unspecified: Secondary | ICD-10-CM | POA: Diagnosis not present

## 2017-10-19 DIAGNOSIS — I10 Essential (primary) hypertension: Secondary | ICD-10-CM | POA: Diagnosis not present

## 2017-10-19 DIAGNOSIS — Z170 Estrogen receptor positive status [ER+]: Principal | ICD-10-CM

## 2017-10-19 DIAGNOSIS — C50212 Malignant neoplasm of upper-inner quadrant of left female breast: Secondary | ICD-10-CM

## 2017-10-20 ENCOUNTER — Encounter: Payer: Self-pay | Admitting: Family Medicine

## 2017-10-20 ENCOUNTER — Telehealth: Payer: Self-pay

## 2017-10-20 ENCOUNTER — Ambulatory Visit: Payer: BLUE CROSS/BLUE SHIELD | Admitting: Family Medicine

## 2017-10-20 VITALS — BP 128/78 | HR 80 | Temp 98.0°F | Resp 16 | Ht 62.0 in | Wt 196.6 lb

## 2017-10-20 DIAGNOSIS — E039 Hypothyroidism, unspecified: Secondary | ICD-10-CM

## 2017-10-20 DIAGNOSIS — E440 Moderate protein-calorie malnutrition: Secondary | ICD-10-CM

## 2017-10-20 DIAGNOSIS — I10 Essential (primary) hypertension: Secondary | ICD-10-CM

## 2017-10-20 DIAGNOSIS — E1122 Type 2 diabetes mellitus with diabetic chronic kidney disease: Secondary | ICD-10-CM | POA: Diagnosis not present

## 2017-10-20 DIAGNOSIS — R52 Pain, unspecified: Secondary | ICD-10-CM | POA: Diagnosis not present

## 2017-10-20 DIAGNOSIS — Z8543 Personal history of malignant neoplasm of ovary: Secondary | ICD-10-CM

## 2017-10-20 DIAGNOSIS — N183 Chronic kidney disease, stage 3 unspecified: Secondary | ICD-10-CM

## 2017-10-20 DIAGNOSIS — L89152 Pressure ulcer of sacral region, stage 2: Secondary | ICD-10-CM

## 2017-10-20 NOTE — Assessment & Plan Note (Signed)
Pt working on diet and inc albumin

## 2017-10-20 NOTE — Progress Notes (Signed)
Patient ID: [name], female    DOB: Nov 17, 1955  Age: 62 y.o. MRN: [id number]    Subjective:  Subjective  HPI [name] presents for hospital admission for acute renal failure superimposed on chronic kidney disease 3/3-3/14    She had b/l nephrostomy tubes placed and has colostomy bag Pt also with protein cal malntrition  She a decubiti ulcer on right buttock---- home health will care for on d/c from rehab   Review of Systems  Constitutional: Negative for appetite change, diaphoresis, fatigue and unexpected weight change.  Eyes: Negative for pain, redness and visual disturbance.  Respiratory: Negative for cough, chest tightness, shortness of breath and wheezing.   Cardiovascular: Negative for chest pain, palpitations and leg swelling.  Endocrine: Negative for cold intolerance, heat intolerance, polydipsia, polyphagia and polyuria.  Genitourinary: Negative for difficulty urinating, dysuria and frequency.  Neurological: Negative for dizziness, light-headedness, numbness and headaches.    History Past Medical History:  Diagnosis Date  . Adrenal adenoma, left 02/08/2016   CT: stable benign  . Anemia in neoplastic disease   . Benign essential HTN   . 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 Cancer of corpus uteri, except isthmus [facility])  oncologist-- dr Denman [name] and dr Alvy Bimler    10-15-2004  dx endometroid endometrial and ovarian cancer s/p  chemotheapy and surgery(TAH w/ BSO) :  recurrent 11-19-2014 post pelvic surgery and radiation 01-29-2015 to 03-10-2015  . Chronic idiopathic neutropenia (HCC)    presumed related to chemotherapy March 2006--- followed by dr Alvy Bimler (treatment w/ G-CSF injections  . Chronic nausea   . Chronic pain    perineal/ anal  area from bladder pad irritates skin , right  flank pain  . CKD stage G2/A3, GFR 60-89 and albumin creatinine ratio >300 mg/g    nephrologist-  dr Madelon Lips  . Diabetic retinopathy, background ([place])   . Difficult intravenous access    small veins--- hx PICC lines  . DM type 2 (diabetes mellitus, type 2) ([place])    monitored by dr Legrand Como altheimer  . Dysuria   . Environmental and seasonal allergies   . Fatty liver 02/08/2016   CT  . Generalized muscle weakness   . GERD (gastroesophageal reflux disease)   . Hiatal hernia   . History of abdominal abscess 04/16/2017   post surgery 04-01-2017  --- resolved 10/ 2018  . History of gastric polyp    2014  duodenum  . History of ileus 04/16/2017   resolved w/ no surgical intervention  . History of radiation therapy    01-29-2015 to 03-10-2015  pelvis 50.4Gy  . Hypothyroidism    monitored by dr Legrand Como altheimer  . Ileostomy in place [facility]) 04/01/2017   created at same time colostomy takedown.  . Lower urinary tract symptoms (LUTS)    urge urinary  incontinence  . Mixed dyslipidemia   . Multiple thyroid nodules    Managed by Dr. Harlow Asa  . Pelvic abscess in female 04/16/2017  . PONV (postoperative nausea and vomiting)    "scopolamine patch works for me"  . Radiation-induced dermatitis    contact dermatitis , radiation completed, rash only on ankles now.  . Seasonal allergies   . Ureteral stricture, right UROLOGIT-  DR [facility]   CHRONIC--  TREATMENT URETERAL STENT  . 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  Vitamin D deficiency   . Wears glasses     She has a past surgical history that includes Appendectomy; Tonsillectomy; Colonoscopy with propofol (N/A, 08/21/2013); EUS (N/A, 10/02/2014); Robotic assisted lap vaginal hysterectomy (N/A, 11/19/2014); Ostomy (N/A, 11/19/2014); Colostomy takedown (N/A, 12/04/2014); Cystoscopy with retrograde pyelogram, ureteroscopy and stent placement (Right, 03/20/2015); EXCISION SOFT TISSUE MASS RIGHT FOREMAN (12-08-2006); Cystoscopy with  retrograde pyelogram, ureteroscopy and stent placement (Right, 05/02/2015); Cystoscopy with retrograde pyelogram, ureteroscopy and stent placement (Right, 09/05/2015); Cystoscopy w/ retrogrades (Right, 11/21/2015); Cystoscopy w/ ureteral stent placement (Right, 11/21/2015); Mastectomy w/ sentinel node biopsy (Bilateral, 12/11/2015); Breast reconstruction with placement of tissue expander and flex hd (acellular hydrated dermis) (Bilateral, 12/11/2015); Incision and drainage of wound (Bilateral, 12/26/2015); Tissue expander filling (Bilateral, 12/26/2015); Laparoscopic cholecystectomy (1990); Eye surgery (as child); Cystoscopy w/ ureteral stent placement (Right, 03/10/2016); Removal of bilateral tissue expanders with placement of bilateral breast implants (Bilateral, 04/16/2016); Liposuction with lipofilling (Bilateral, 04/16/2016); Cystoscopy w/ ureteral stent placement (Right, 06/30/2016); Cystoscopy with stent placement (Right, 10/27/2016); XI robotic assisted lower anterior resection (N/A, 04/01/2017); Proctoscopy (N/A, 04/01/2017); Cystoscopy with retrograde pyelogram, ureteroscopy and stent placement (Right, 04/01/2017); IR US Guide Vasc Access Right (04/06/2017); IR Fluoro Guide CV Line Right (04/06/2017); IR US Guide Vasc Access Right (05/30/2017); IR Fluoro Guide CV Midline PICC Right (05/30/2017); IR Fluoro Guide CV Line Left (05/31/2017); IR US Guide Vasc Access Left (05/31/2017); IR CV Line Injection (05/31/2017); IR Radiologist Eval & Mgmt (05/03/2017); Cystoscopy w/ ureteral stent placement (N/A, 06/01/2017); Cystogram (N/A, 06/01/2017); Total abdominal hysterectomy (March 2006   [facility]); Cystoscopy w/ ureteral stent placement (Right, 08/17/2017); IR NEPHROSTOGRAM RIGHT INITIAL PLACEMENT (09/02/2017); IR NEPHROSTOGRAM LEFT INITIAL PLACEMENT (09/02/2017); IR NEPHROSTOGRAM RIGHT THRU EXISTING ACCESS (09/13/2017); IR NEPHROSTOMY PLACEMENT LEFT (10/02/2017); and IR NEPHROSTOMY EXCHANGE RIGHT (10/02/2017).   Her family history includes  Brain cancer in her paternal grandfather; Breast cancer in her maternal aunt; Cancer (age of onset: 31) in her mother; Cancer (age of onset: 68) in her father; Diabetes in her brother, father, and sister; Heart disease in her brother and father; Hypertension in her mother; Hypertension (age of onset: y-10) in her brother; Lymphoma in her paternal aunt; Ovarian cancer in her other.She reports that she has never smoked. She has never used smokeless tobacco. She reports that she drinks alcohol. She reports that she does not use drugs.  Current Outpatient Medications on File Prior to Visit  Medication Sig Dispense Refill  . acetaminophen (TYLENOL) 325 MG tablet Take 2 tablets (650 mg total) by mouth every 6 (six) hours as needed. 30 tablet 1  . anastrozole (ARIMIDEX) 1 MG tablet Take 1 tablet (1 mg total) by mouth daily. 30 tablet 3  . Biotin 5 MG TABS Take 5 mg by mouth every morning.     . Cholecalciferol (VITAMIN D3) 10000 UNITS capsule Take 10,000 Units by mouth once a week. Sunday evening's    . collagenase (SANTYL) ointment Apply 1 application topically daily.    . diphenhydrAMINE (BENADRYL) 25 mg capsule Take 1 capsule (25 mg total) by mouth every 8 (eight) hours as needed for itching, allergies or sleep. 30 capsule 0  . diphenoxylate-atropine (LOMOTIL) 2.5-0.025 MG tablet Take 1 tablet by mouth 4 (four) times daily as needed for diarrhea or loose stools. TO PREVENT LOOSE BOWEL MOVEMENTS 30 tablet 0  . famotidine (PEPCID) 20 MG tablet Take 20 mg by mouth daily as needed for heartburn or indigestion.    . ferrous sulfate 325 (65 FE) MG tablet Take 1 tablet (325 mg  total) by mouth at bedtime. 30 tablet 3  . filgrastim (NEUPOGEN) 480 MCG/1.6ML injection Inject 480 mcg/1.6 ml under the skin every 6 days for life (last dose 09/22/17) 4 vial 3  . HYDROmorphone (DILAUDID) 2 MG tablet Take 1 tablet (2 mg total) by mouth every 3 (three) hours as needed for moderate pain or severe pain. 8 tablet 0  . insulin  aspart (NOVOLOG) 100 UNIT/ML injection Inject 0-20 Units into the skin 3 (three) times daily with meals. (Patient taking differently: Inject 2-12 Units into the skin See admin instructions. Per sliding scale; if BS <60, call MD. If BS 201-250=2 units, 251-300=4 units, 301-350=6 units, 351-400=8 units, 401-450=10 units, BS>450=12 units) 10 mL 11  . insulin aspart (NOVOLOG) 100 UNIT/ML injection Inject 7 Units into the skin 3 (three) times daily with meals. 10 mL 11  . levothyroxine (SYNTHROID, LEVOTHROID) 150 MCG tablet Take 1 tablet (150 mcg total) by mouth daily before breakfast. 30 tablet 1  . linezolid (ZYVOX) 600 MG tablet Take 1 tablet (600 mg total) by mouth 2 (two) times daily for 14 days. 28 tablet 0  . lip balm (CARMEX) ointment Apply 1 application topically 2 (two) times daily. 7 g 0  . loperamide (IMODIUM) 2 MG capsule Take 1-2 capsules (2-4 mg total) by mouth every 8 (eight) hours as needed for diarrhea or loose stools (Use if >2 BM every 8 hours). 30 capsule 0  . loratadine (CLARITIN) 10 MG tablet Take 10 mg by mouth every morning.     . methocarbamol (ROBAXIN) 750 MG tablet Take 1 tablet (750 mg total) by mouth 3 (three) times daily. (Patient taking differently: Take 750 mg by mouth 3 (three) times daily as needed for muscle spasms. ) 90 tablet 1  . omega-3 acid ethyl esters (LOVAZA) 1 G capsule Take 1 g by mouth 2 (two) times daily.     . ondansetron (ZOFRAN-ODT) 4 MG disintegrating tablet Take 4 mg by mouth every 6 (six) hours as needed for nausea or vomiting.     Vladimir Faster Glycol-Propyl Glycol (SYSTANE OP) Place 1 drop into both eyes at bedtime.     . pregabalin (LYRICA) 50 MG capsule Take 50 mg by mouth 2 (two) times daily.    . Prenatal Vit-Fe Fumarate-FA (PRENATAL VITAMIN PO) Take 1 capsule by mouth daily. Takes prenatal because there are no dyes in it    . prochlorperazine (COMPAZINE) 10 MG tablet Take 1 tablet (10 mg total) by mouth 2 (two) times daily as needed for nausea or  vomiting. (Patient taking differently: Take 10 mg by mouth 2 (two) times daily as needed for nausea or vomiting. ) 10 tablet 0  . promethazine (PHENERGAN) 25 MG tablet Take 1 tablet (25 mg total) by mouth every 6 (six) hours as needed for nausea or vomiting. (Patient taking differently: Take 25 mg by mouth 4 (four) times daily as needed for nausea or vomiting. ) 12 tablet 0  . protein supplement shake (PREMIER PROTEIN) LIQD Take 325 mLs (11 oz total) by mouth 2 (two) times daily as needed (Pt to request). 60 Can 1  . psyllium (HYDROCIL/METAMUCIL) 95 % PACK Take 1 packet by mouth 2 (two) times daily. 56 each 1  . ranitidine (ZANTAC) 150 MG tablet Take 1 tablet (150 mg total) by mouth 2 (two) times daily as needed for heartburn. 60 tablet 1  . rosuvastatin (CRESTOR) 10 MG tablet Take 10 mg by mouth every evening.     . sitaGLIPtin (JANUVIA) 100  MG tablet Take 100 mg by mouth every morning.     . tuberculin (TUBERSOL) 5 UNIT/0.1ML injection Inject 5 Units into the skin every 14 (fourteen) days.    . fentaNYL (DURAGESIC) 25 MCG/HR patch Place 1 patch (25 mcg total) onto the skin every 3 (three) days. 5 patch 0   No current facility-administered medications on file prior to visit.      Objective:  Objective  Physical Exam  Constitutional: She is oriented to person, place, and time. She appears well-developed and well-nourished.  HENT:  Head: Normocephalic and atraumatic.  Eyes: Conjunctivae and EOM are normal.  Neck: Normal range of motion. Neck supple. No JVD present. Carotid bruit is not present. No thyromegaly present.  Cardiovascular: Normal rate, regular rhythm and normal heart sounds.  No murmur heard. Pulmonary/Chest: Effort normal and breath sounds normal. No respiratory distress. She has no wheezes. She has no rales. She exhibits no tenderness.  Abdominal: Soft.  Colostomy bag   Genitourinary:  Genitourinary Comments: B/l nephrostomy tubes  Musculoskeletal: She exhibits no edema.    Neurological: She is alert and oriented to person, place, and time.  Psychiatric: She has a normal mood and affect.  Nursing note and vitals reviewed.  BP 128/78 (BP Location: Left Wrist, Cuff Size: Normal)   Pulse 80   Temp 98 F (36.7 C) (Oral)   Resp 16   Ht _0  (1.575 m)   Wt 196 lb 9.6 oz (89.2 kg)   SpO2 98%   BMI 35.96 kg/m  Wt Readings from Last 3 Encounters:  10/20/17 196 lb 9.6 oz (89.2 kg)  10/13/17 189 lb 14.4 oz (86.1 kg)  09/25/17 197 lb 12 oz (89.7 kg)     Lab Results  Component Value Date   WBC 6.9 10/13/2017   HGB 9.5 (L) 10/13/2017   HCT 30.3 (L) 10/13/2017   PLT 254 10/13/2017   GLUCOSE 106 (H) 10/13/2017   CHOL 109 08/28/2015   TRIG 89 04/18/2017   HDL 43.80 08/28/2015   LDLCALC 45 08/28/2015   ALT 19 10/04/2017   AST 18 10/04/2017   NA 135 10/13/2017   K 4.2 10/13/2017   CL 103 10/13/2017   CREATININE 1.25 (H) 10/13/2017   BUN 43 (H) 10/13/2017   CO2 21 (L) 10/13/2017   TSH 2.408 08/30/2017   INR 1.34 10/02/2017   HGBA1C 7.9 (H) 09/01/2017   MICROALBUR 5.6 (H) 08/30/2016    Ct Abdomen Pelvis Wo Contrast  Result Date: 10/02/2017 CLINICAL DATA:  Pt has sepsis Had recent enterococcus wound infection and also serratia UTI EXAM: CT ABDOMEN AND PELVIS WITHOUT CONTRAST TECHNIQUE: Multidetector CT imaging of the abdomen and pelvis was performed following the standard protocol without IV contrast. COMPARISON:  09/22/2017 FINDINGS: Lower chest: There is patchy opacity in the lower lobes at the lung bases, right greater than left. This is new since the prior CT. This may all be atelectasis. Pneumonia should be considered if there are consistent clinical findings. No pulmonary edema. No pleural effusion. Hepatobiliary: Normal liver. Gallbladder surgically absent. Mild prominence of common bile duct with normal distal tapering. Pancreas: No pancreatic mass or inflammation. Mild pancreatic atrophy. Spleen: Normal in size without focal abnormality.  Adrenals/Urinary Tract: 17 mm left adrenal mass, stable from the prior CT. Left kidney is swollen with a poorly defined collecting system that appears at least mildly dilated. There is a nonobstructing stone in the lower pole. Right ureter is mildly dilated. It is not well visualized distally. No convincing  ureteral stone. A nephrostomy catheter lies within the right kidney. No significant right hydronephrosis. Right ureter is not well followed distally. No convincing ureteral stone. No renal masses. Bladder is decompressed with a Foley catheter. Nephrostomy tube on the left has been removed since the prior CT. Stomach/Bowel: No bowel obstruction or convincing inflammation. Right mid abdomen colostomy. Stomach is unremarkable. Vascular/Lymphatic: There are prominent retroperitoneal lymph nodes, none pathologically enlarged, all stable from the prior CT. No vascular abnormality. Reproductive: Uterus is surgically absent.  No discrete pelvic mass. Other: The presacral collection noted on the prior CT dated 09/15/2017, is no longer discretely seen. There is still soft tissue attenuation in this location adjacent to surgical vascular clips. The left posterior pelvic pigtail catheter has been removed since the prior exam. No significant ascites. Musculoskeletal: No fracture or acute finding. No osteoblastic or osteolytic lesions. IMPRESSION: 1. Since the prior CT, the left posterior pelvic pigtail catheter has been removed. This catheter had withdrawn into the gluteus maximus from the presacral collection seen on the CT dated 09/15/2017. 2. The presacral fluid collection, where the pigtail catheter had originally been placed, is no longer discretely visualized, although there is still soft tissue attenuation in the presacral space. The residual soft tissue attenuation may be postsurgical scarring/change. No definite residual or recurrent abscess. 3. There are new lower lobe airspace opacities at the lung bases, which may  all be atelectasis. Pneumonia should be suspected if there are consistent clinical findings. 4. Left nephrostomy tube has been removed since the prior CT. Left kidney appears swollen with at least mild hydronephrosis. The proximal left ureter is mildly distended. The distal ureter is not well-defined. 5. No change in the appearance of the right kidney with stable positioning of the right nephrostomy tube. 6. Changes from bowel surgery and formation of the right mid to upper quadrant colostomy are stable. Electronically Signed   By: Lajean Manes M.D.   On: 10/02/2017 10:13   Ct Head Wo Contrast  Result Date: 10/02/2017 CLINICAL DATA:  Altered mental status.  Nonverbal. EXAM: CT HEAD WITHOUT CONTRAST TECHNIQUE: Contiguous axial images were obtained from the base of the skull through the vertex without intravenous contrast. COMPARISON:  None. FINDINGS: Brain: No evidence of acute infarction, hemorrhage, hydrocephalus, extra-axial collection or mass lesion/mass effect. Vascular: No hyperdense vessel or unexpected calcification. Skull: Normal. Negative for fracture or focal lesion. Sinuses/Orbits: Globes and orbits are unremarkable. There is a small amount of dependent fluid in the right sphenoid sinus. Sinuses otherwise clear. Clear mastoid air cells. Other: None. IMPRESSION: No intracranial abnormality. Electronically Signed   By: Lajean Manes M.D.   On: 10/02/2017 12:04   Dg Chest Port 1 View  Result Date: 10/03/2017 CLINICAL DATA:  Central line placement. EXAM: PORTABLE CHEST 1 VIEW COMPARISON:  10/02/2017 FINDINGS: Left internal jugular central venous line has its tip projecting at the caval atrial junction. No pneumothorax. Cardiac silhouette is normal in size. No mediastinal or hilar masses. Clear lungs. IMPRESSION: 1. Left internal jugular central venous line is well positioned, tip projecting at the caval atrial junction. No pneumothorax. 2. No acute cardiopulmonary disease. Electronically Signed   By:  Lajean Manes M.D.   On: 10/03/2017 12:17   Dg Chest Portable 1 View  Result Date: 10/02/2017 CLINICAL DATA:  Lethargy, dyspnea EXAM: PORTABLE CHEST 1 VIEW COMPARISON:  08/31/2017 FINDINGS: Lungs are clear.  No pleural effusion or pneumothorax. The heart is normal in size. IMPRESSION: No evidence of acute cardiopulmonary disease. Electronically Signed  By: Julian Hy M.D.   On: 10/02/2017 08:12   Ir Nephrostomy Placement Left  Result Date: 10/02/2017 INDICATION: History of endometrial cancer complicated by development of bilateral ureteral obstruction, post bilateral percutaneous nephrostomy catheter placement on 09/02/2017. Unfortunately, the patient has now been admitted with interval removal of left-sided nephrostomy catheter, as well as findings worrisome for impending urosepsis with markedly cloudy urine within the right nephrostomy. As such, request made for image guided replacement of left-sided nephrostomy catheter as well as right-sided nephrostomy catheter exchange and up sizing. EXAM: 1. FLUOROSCOPIC GUIDED REPLACEMENT OF LEFT-SIDED NEPHROSTOMY CATHETER 2. FLUOROSCOPIC GUIDED EXCHANGE OF RIGHT-SIDED NEPHROSTOMY CATHETER EXCHANGE COMPARISON:  CT abdomen and pelvis - 10/02/2017; 09/22/2017; ultrasound and fluoroscopic guided bilateral percutaneous nephrostomy catheter placement - 09/02/2017 CONTRAST:  A total of 20 mL Isovue-300 administered was administered into both collecting systems FLUOROSCOPY TIME:  4 minute 18 seconds (88 mGy) COMPLICATIONS: None immediate. TECHNIQUE: Informed written consent was obtained from the the patient's family after a discussion of the risks, benefits and alternatives to treatment. Questions regarding the procedure were encouraged and answered. A timeout was performed prior to the initiation of the procedure. The bilateral flanks and external portions of the existing right-sided percutaneous nephrostomy catheter and entrance site of inadvertently removed  left-sided nephrostomy catheter were prepped and draped in the usual sterile fashion. A sterile drape was applied covering the operative field. Maximum barrier sterile technique with sterile gowns and gloves were used for the procedure. A timeout was performed prior to the initiation of the procedure. A pre procedural spot fluoroscopic image was obtained. The entrance site of the inadvertently removed left-sided nephrostomy catheter was cannulated with a Christmas tree adapter. Contrast injection was performed. With the use of a regular glidewire, a Kumpe catheter was advanced through the subcutaneous tract to the level of the left renal pelvis. Contrast injection confirmed appropriate positioning. Next, over a short Amplatz wire, a new 53 French percutaneous drainage catheter was advanced through the existing nephrostomy catheter track with end ultimately coiled and locked within the left renal pelvis. Contrast injection confirmed appropriate positioning. A sample of urine was aspirated, capped and sent to the laboratory for analysis. Attention was now paid towards the exchange and up sizing of the right-sided nephrostomy catheter. Contrast injection confirmed appropriate positioning and functionality of existing right-sided nephrostomy catheter. A small amount of urine was aspirated from the existing right-sided nephrostomy catheter, capped and sent to the laboratory for analysis. Next, the external portion of the nephrostomy catheter was cut and cannulated with a short Amplatz wire which was coiled within the right renal pelvis. Under intermittent fluoroscopic guidance, the existing 10 French nephrostomy catheter was exchanged for a new, slightly larger now 12 French nephrostomy catheter with end ultimately coiled and locked within the right renal pelvis. Contrast injection confirmed appropriate positioning. Both nephrostomy catheters were secured at the skin entrance site with 2 interrupted sutures and StatLock  devices. Both nephrostomy catheters were reconnected to gravity bags. Dressings were placed. The patient tolerated the above procedures well without immediate postprocedural complication. FINDINGS: Subcutaneous tract injection demonstrated persistent patency of the left-sided percutaneous nephrostomy catheter tract. Successful fluoroscopic guided recanalization of the left-sided nephrostomy catheter track allowing placement of a new 12 French percutaneous drainage catheter with end coiled end locked within the left renal pelvis. Successful fluoroscopic guided exchange and up sizing of new now 93 French right-sided percutaneous nephrostomy catheter with end coiled and locked within the right renal pelvis. IMPRESSION: 1. Successful fluoroscopic guided recanalization  of the left-sided nephrostomy catheter track allowing placement of a new 12 French percutaneous drainage catheter with and coiled end locked within the left renal pelvis. 2. Successful fluoroscopic guided exchange and up sizing of new now 70 French right-sided percutaneous nephrostomy catheter with end coiled and locked within the right renal pelvis. 3. Urine sample capped and sent to the laboratory for analysis. Electronically Signed   By: Sandi Mariscal M.D.   On: 10/02/2017 18:29   Ir Nephrostomy Exchange Right  Result Date: 10/02/2017 INDICATION: History of endometrial cancer complicated by development of bilateral ureteral obstruction, post bilateral percutaneous nephrostomy catheter placement on 09/02/2017. Unfortunately, the patient has now been admitted with interval removal of left-sided nephrostomy catheter, as well as findings worrisome for impending urosepsis with markedly cloudy urine within the right nephrostomy. As such, request made for image guided replacement of left-sided nephrostomy catheter as well as right-sided nephrostomy catheter exchange and up sizing. EXAM: 1. FLUOROSCOPIC GUIDED REPLACEMENT OF LEFT-SIDED NEPHROSTOMY CATHETER 2.  FLUOROSCOPIC GUIDED EXCHANGE OF RIGHT-SIDED NEPHROSTOMY CATHETER EXCHANGE COMPARISON:  CT abdomen and pelvis - 10/02/2017; 09/22/2017; ultrasound and fluoroscopic guided bilateral percutaneous nephrostomy catheter placement - 09/02/2017 CONTRAST:  A total of 20 mL Isovue-300 administered was administered into both collecting systems FLUOROSCOPY TIME:  4 minute 18 seconds (88 mGy) COMPLICATIONS: None immediate. TECHNIQUE: Informed written consent was obtained from the the patient's family after a discussion of the risks, benefits and alternatives to treatment. Questions regarding the procedure were encouraged and answered. A timeout was performed prior to the initiation of the procedure. The bilateral flanks and external portions of the existing right-sided percutaneous nephrostomy catheter and entrance site of inadvertently removed left-sided nephrostomy catheter were prepped and draped in the usual sterile fashion. A sterile drape was applied covering the operative field. Maximum barrier sterile technique with sterile gowns and gloves were used for the procedure. A timeout was performed prior to the initiation of the procedure. A pre procedural spot fluoroscopic image was obtained. The entrance site of the inadvertently removed left-sided nephrostomy catheter was cannulated with a Christmas tree adapter. Contrast injection was performed. With the use of a regular glidewire, a Kumpe catheter was advanced through the subcutaneous tract to the level of the left renal pelvis. Contrast injection confirmed appropriate positioning. Next, over a short Amplatz wire, a new 37 French percutaneous drainage catheter was advanced through the existing nephrostomy catheter track with end ultimately coiled and locked within the left renal pelvis. Contrast injection confirmed appropriate positioning. A sample of urine was aspirated, capped and sent to the laboratory for analysis. Attention was now paid towards the exchange and up  sizing of the right-sided nephrostomy catheter. Contrast injection confirmed appropriate positioning and functionality of existing right-sided nephrostomy catheter. A small amount of urine was aspirated from the existing right-sided nephrostomy catheter, capped and sent to the laboratory for analysis. Next, the external portion of the nephrostomy catheter was cut and cannulated with a short Amplatz wire which was coiled within the right renal pelvis. Under intermittent fluoroscopic guidance, the existing 10 French nephrostomy catheter was exchanged for a new, slightly larger now 12 French nephrostomy catheter with end ultimately coiled and locked within the right renal pelvis. Contrast injection confirmed appropriate positioning. Both nephrostomy catheters were secured at the skin entrance site with 2 interrupted sutures and StatLock devices. Both nephrostomy catheters were reconnected to gravity bags. Dressings were placed. The patient tolerated the above procedures well without immediate postprocedural complication. FINDINGS: Subcutaneous tract injection demonstrated persistent patency of  the left-sided percutaneous nephrostomy catheter tract. Successful fluoroscopic guided recanalization of the left-sided nephrostomy catheter track allowing placement of a new 12 French percutaneous drainage catheter with end coiled end locked within the left renal pelvis. Successful fluoroscopic guided exchange and up sizing of new now 3 French right-sided percutaneous nephrostomy catheter with end coiled and locked within the right renal pelvis. IMPRESSION: 1. Successful fluoroscopic guided recanalization of the left-sided nephrostomy catheter track allowing placement of a new 12 French percutaneous drainage catheter with and coiled end locked within the left renal pelvis. 2. Successful fluoroscopic guided exchange and up sizing of new now 17 French right-sided percutaneous nephrostomy catheter with end coiled and locked within  the right renal pelvis. 3. Urine sample capped and sent to the laboratory for analysis. Electronically Signed   By: Sandi Mariscal M.D.   On: 10/02/2017 18:29     Assessment & Plan:  Plan  I have discontinued Evelena Derwin's artificial tears, Gerhardt's butt cream, lidocaine, polyvinyl alcohol, traZODone, and ALPRAZolam. I am also having her maintain her rosuvastatin, omega-3 acid ethyl esters, sitaGLIPtin, Vitamin D3, Polyethyl Glycol-Propyl Glycol (SYSTANE OP), Biotin, loratadine, Prenatal Vit-Fe Fumarate-FA (PRENATAL VITAMIN PO), ondansetron, promethazine, prochlorperazine, acetaminophen, anastrozole, diphenhydrAMINE, diphenoxylate-atropine, ferrous sulfate, filgrastim, insulin aspart, insulin aspart, levothyroxine, lip balm, loperamide, methocarbamol, protein supplement shake, psyllium, ranitidine, famotidine, pregabalin, collagenase, tuberculin, linezolid, HYDROmorphone, and fentaNYL.  No orders of the defined types were placed in this encounter.   Problem List Items Addressed This Visit      Unprioritized   Benign essential HTN    Well controlled, no changes to meds. Encouraged heart healthy diet such as the DASH diet and exercise as tolerated.       Chronic kidney disease (CKD), stage III (moderate) (HCC)    Per nephrology       Decubitus ulcer of sacral region, stage 2    Rehab changing dressing now Home health to take over      DM type 2 (diabetes mellitus, type 2) ([place])    Per endo Lab Results  Component Value Date   HGBA1C 7.9 (H) 09/01/2017        History of ovarian & endometrial cancer    Per onc      Primary hypothyroidism    Per endo Lab Results  Component Value Date   TSH 2.408 08/30/2017        Protein-calorie malnutrition, moderate (HCC)    Pt working on diet and inc albumin       Other Visit Diagnoses    Pain    -  Primary   Relevant Medications   fentaNYL (DURAGESIC) 25 MCG/HR patch      Follow-up: Return in about 6 months (around 04/22/2018),  or if symptoms worsen or fail to improve.  Ann Held, DO

## 2017-10-20 NOTE — Patient Instructions (Signed)
Chronic Kidney Disease, Adult Chronic kidney disease (CKD) happens when the kidneys are damaged during a time of 3 or more months. The kidneys are two organs that do many important jobs in the body. These jobs include:  Removing wastes and extra fluids from the blood.  Making hormones that maintain the amount of fluid in your tissues and blood vessels.  Making sure that the body has the right amount of fluids and chemicals.  Most of the time, this condition does not go away, but it can usually be controlled. Steps must be taken to slow down the kidney damage or stop it from getting worse. Otherwise, the kidneys may stop working. Follow these instructions at home:  Follow your diet as told by your doctor. You may need to avoid alcohol, salty foods (sodium), and foods that are high in potassium, calcium, and protein.  Take over-the-counter and prescription medicines only as told by your doctor. Do not take any new medicines unless your doctor says you can do that. These include vitamins and minerals. ? Medicines and nutritional supplements can make kidney damage worse. ? Your doctor may need to change how much medicine you take.  Do not use any tobacco products. These include cigarettes, chewing tobacco, and e-cigarettes. If you need help quitting, ask your doctor.  Keep all follow-up visits as told by your doctor. This is important.  Check your blood pressure. Tell your doctor if there are changes to your blood pressure.  Get to a healthy weight. Stay at that weight. If you need help with this, ask your doctor.  Start or continue an exercise plan. Try to exercise at least 30 minutes a day, 5 days a week.  Stay up-to-date with your shots (immunizations) as told by your doctor. Contact a doctor if:  Your symptoms get worse.  You have new symptoms. Get help right away if:  You have symptoms of end-stage kidney disease. These include: ? Headaches. ? Skin that is darker or lighter  than normal. ? Numbness in your hands or feet. ? Easy bruising. ? Having hiccups often. ? Chest pain. ? Shortness of breath. ? Stopping of menstrual periods in women.  You have a fever.  You are making very little pee (urine).  You have pain or bleeding when you pee (urinate). This information is not intended to replace advice given to you by your health care provider. Make sure you discuss any questions you have with your health care provider. Document Released: 10/13/2009 Document Revised: 12/25/2015 Document Reviewed: 03/17/2012 Elsevier Interactive Patient Education  2017 Elsevier Inc.  

## 2017-10-20 NOTE — Telephone Encounter (Signed)
Matt called with [employer] and left message asking if Dr. Alvy Bimler could order IV fluids and IV magnesium for patient with her 3/26 appt in the infusion room.  Called back per Dr. Alvy Bimler. The infusion room is full that day at [facility]. Dr. Alvy Bimler is willing to change her appt that day to accommodate Short Stay doing IV fluids. Matt verbalized understanding, instructed to call if we need to change times of appt.

## 2017-10-20 NOTE — Telephone Encounter (Signed)
Received VM from [facility] for [facility] where patient is a resident there at this time.  Called Whitley back and she would like the appt dates/times for [facility] [place] and with Dr Alvy Bimler so the nursing assistant with have pt bathed, dressed, and ready for transport.  Let her be aware of this Friday's appt 8:10 am be at [facility] and follow up visit with Dr Alvy Bimler on March 26th, be here at 9:15 am.

## 2017-10-20 NOTE — Assessment & Plan Note (Signed)
Per endo Lab Results  Component Value Date   TSH 2.408 08/30/2017

## 2017-10-20 NOTE — Assessment & Plan Note (Signed)
Per onc  

## 2017-10-20 NOTE — Assessment & Plan Note (Signed)
Per nephrology 

## 2017-10-20 NOTE — Assessment & Plan Note (Signed)
Well controlled, no changes to meds. Encouraged heart healthy diet such as the DASH diet and exercise as tolerated.  °

## 2017-10-20 NOTE — Assessment & Plan Note (Signed)
Per endo Lab Results  Component Value Date   HGBA1C 7.9 (H) 09/01/2017

## 2017-10-20 NOTE — Assessment & Plan Note (Signed)
Rehab changing dressing now Home health to take over

## 2017-10-21 ENCOUNTER — Ambulatory Visit
Admission: RE | Admit: 2017-10-21 | Discharge: 2017-10-21 | Disposition: A | Discharge status: Home / Self Care | Payer: BLUE CROSS/BLUE SHIELD | Source: Ambulatory Visit | Attending: Hematology and Oncology | Admitting: Hematology and Oncology

## 2017-10-21 ENCOUNTER — Other Ambulatory Visit: Payer: Self-pay | Admitting: Hematology and Oncology

## 2017-10-21 DIAGNOSIS — C50212 Malignant neoplasm of upper-inner quadrant of left female breast: Secondary | ICD-10-CM

## 2017-10-21 DIAGNOSIS — M7989 Other specified soft tissue disorders: Secondary | ICD-10-CM | POA: Diagnosis not present

## 2017-10-21 DIAGNOSIS — Z170 Estrogen receptor positive status [ER+]: Principal | ICD-10-CM

## 2017-10-21 DIAGNOSIS — N6489 Other specified disorders of breast: Secondary | ICD-10-CM | POA: Diagnosis not present

## 2017-10-21 DIAGNOSIS — R911 Solitary pulmonary nodule: Secondary | ICD-10-CM | POA: Diagnosis not present

## 2017-10-24 ENCOUNTER — Other Ambulatory Visit (HOSPITAL_COMMUNITY): Payer: Self-pay | Admitting: *Deleted

## 2017-10-24 DIAGNOSIS — E119 Type 2 diabetes mellitus without complications: Secondary | ICD-10-CM | POA: Diagnosis not present

## 2017-10-24 DIAGNOSIS — H524 Presbyopia: Secondary | ICD-10-CM | POA: Diagnosis not present

## 2017-10-24 DIAGNOSIS — T82594D Other mechanical complication of infusion catheter, subsequent encounter: Secondary | ICD-10-CM | POA: Diagnosis not present

## 2017-10-24 DIAGNOSIS — D631 Anemia in chronic kidney disease: Secondary | ICD-10-CM | POA: Diagnosis not present

## 2017-10-24 DIAGNOSIS — H5213 Myopia, bilateral: Secondary | ICD-10-CM | POA: Diagnosis not present

## 2017-10-24 DIAGNOSIS — D709 Neutropenia, unspecified: Secondary | ICD-10-CM | POA: Diagnosis not present

## 2017-10-24 LAB — HM DIABETES EYE EXAM

## 2017-10-25 ENCOUNTER — Ambulatory Visit: Payer: Self-pay | Admitting: Hematology and Oncology

## 2017-10-25 ENCOUNTER — Ambulatory Visit (HOSPITAL_COMMUNITY)
Admission: RE | Admit: 2017-10-25 | Discharge: 2017-10-25 | Disposition: A | Discharge status: Home / Self Care | Payer: BLUE CROSS/BLUE SHIELD | Source: Ambulatory Visit | Attending: Nephrology | Admitting: Nephrology

## 2017-10-25 ENCOUNTER — Other Ambulatory Visit: Payer: Self-pay

## 2017-10-25 DIAGNOSIS — L89319 Pressure ulcer of right buttock, unspecified stage: Secondary | ICD-10-CM | POA: Diagnosis not present

## 2017-10-25 DIAGNOSIS — Z933 Colostomy status: Secondary | ICD-10-CM | POA: Diagnosis not present

## 2017-10-25 DIAGNOSIS — E861 Hypovolemia: Secondary | ICD-10-CM | POA: Diagnosis not present

## 2017-10-25 MED ORDER — HEPARIN SOD (PORK) LOCK FLUSH 100 UNIT/ML IV SOLN
INTRAVENOUS | Status: AC
  Filled 2017-10-25: qty 5

## 2017-10-25 MED ORDER — HEPARIN SOD (PORK) LOCK FLUSH 100 UNIT/ML IV SOLN
250.0000 [IU] | INTRAVENOUS | Status: AC | PRN
  Administered 2017-10-25: 250 [IU]

## 2017-10-25 MED ORDER — SODIUM CHLORIDE 0.9 % IV SOLN
INTRAVENOUS | Status: DC
  Administered 2017-10-25: 10:00:00 via INTRAVENOUS

## 2017-10-25 MED ORDER — MAGNESIUM SULFATE 2 GM/50ML IV SOLN
2.0000 g | INTRAVENOUS | Status: DC
  Administered 2017-10-25: 2 g via INTRAVENOUS
  Filled 2017-10-25 (×2): qty 50

## 2017-10-26 DIAGNOSIS — D631 Anemia in chronic kidney disease: Secondary | ICD-10-CM | POA: Diagnosis not present

## 2017-10-26 DIAGNOSIS — D709 Neutropenia, unspecified: Secondary | ICD-10-CM | POA: Diagnosis not present

## 2017-10-26 DIAGNOSIS — T82594D Other mechanical complication of infusion catheter, subsequent encounter: Secondary | ICD-10-CM | POA: Diagnosis not present

## 2017-10-27 DIAGNOSIS — L89312 Pressure ulcer of right buttock, stage 2: Secondary | ICD-10-CM | POA: Diagnosis not present

## 2017-10-27 DIAGNOSIS — T83592A Infection and inflammatory reaction due to indwelling ureteral stent, initial encounter: Secondary | ICD-10-CM | POA: Diagnosis not present

## 2017-10-27 DIAGNOSIS — E1122 Type 2 diabetes mellitus with diabetic chronic kidney disease: Secondary | ICD-10-CM | POA: Diagnosis not present

## 2017-10-27 DIAGNOSIS — D709 Neutropenia, unspecified: Secondary | ICD-10-CM | POA: Diagnosis not present

## 2017-10-27 DIAGNOSIS — N179 Acute kidney failure, unspecified: Secondary | ICD-10-CM | POA: Diagnosis not present

## 2017-10-27 DIAGNOSIS — C541 Malignant neoplasm of endometrium: Secondary | ICD-10-CM | POA: Diagnosis not present

## 2017-10-27 DIAGNOSIS — E119 Type 2 diabetes mellitus without complications: Secondary | ICD-10-CM | POA: Diagnosis not present

## 2017-10-27 DIAGNOSIS — B9562 Methicillin resistant Staphylococcus aureus infection as the cause of diseases classified elsewhere: Secondary | ICD-10-CM | POA: Diagnosis not present

## 2017-10-27 DIAGNOSIS — E039 Hypothyroidism, unspecified: Secondary | ICD-10-CM | POA: Diagnosis not present

## 2017-10-27 DIAGNOSIS — N182 Chronic kidney disease, stage 2 (mild): Secondary | ICD-10-CM | POA: Diagnosis not present

## 2017-10-27 DIAGNOSIS — Z452 Encounter for adjustment and management of vascular access device: Secondary | ICD-10-CM | POA: Diagnosis not present

## 2017-10-27 DIAGNOSIS — L89153 Pressure ulcer of sacral region, stage 3: Secondary | ICD-10-CM | POA: Diagnosis not present

## 2017-10-27 DIAGNOSIS — E782 Mixed hyperlipidemia: Secondary | ICD-10-CM | POA: Diagnosis not present

## 2017-10-27 DIAGNOSIS — L89311 Pressure ulcer of right buttock, stage 1: Secondary | ICD-10-CM | POA: Diagnosis not present

## 2017-10-27 DIAGNOSIS — T82594D Other mechanical complication of infusion catheter, subsequent encounter: Secondary | ICD-10-CM | POA: Diagnosis not present

## 2017-10-27 DIAGNOSIS — D631 Anemia in chronic kidney disease: Secondary | ICD-10-CM | POA: Diagnosis not present

## 2017-10-28 DIAGNOSIS — E1122 Type 2 diabetes mellitus with diabetic chronic kidney disease: Secondary | ICD-10-CM | POA: Diagnosis not present

## 2017-10-28 DIAGNOSIS — C541 Malignant neoplasm of endometrium: Secondary | ICD-10-CM | POA: Diagnosis not present

## 2017-10-28 DIAGNOSIS — E119 Type 2 diabetes mellitus without complications: Secondary | ICD-10-CM | POA: Diagnosis not present

## 2017-10-28 DIAGNOSIS — D631 Anemia in chronic kidney disease: Secondary | ICD-10-CM | POA: Diagnosis not present

## 2017-10-28 DIAGNOSIS — E782 Mixed hyperlipidemia: Secondary | ICD-10-CM | POA: Diagnosis not present

## 2017-10-28 DIAGNOSIS — N182 Chronic kidney disease, stage 2 (mild): Secondary | ICD-10-CM | POA: Diagnosis not present

## 2017-10-28 DIAGNOSIS — L89311 Pressure ulcer of right buttock, stage 1: Secondary | ICD-10-CM | POA: Diagnosis not present

## 2017-10-28 DIAGNOSIS — L89309 Pressure ulcer of unspecified buttock, unspecified stage: Secondary | ICD-10-CM | POA: Diagnosis not present

## 2017-10-28 DIAGNOSIS — Z794 Long term (current) use of insulin: Secondary | ICD-10-CM | POA: Diagnosis not present

## 2017-10-28 DIAGNOSIS — T82594D Other mechanical complication of infusion catheter, subsequent encounter: Secondary | ICD-10-CM | POA: Diagnosis not present

## 2017-10-28 DIAGNOSIS — I10 Essential (primary) hypertension: Secondary | ICD-10-CM | POA: Diagnosis not present

## 2017-10-28 DIAGNOSIS — L89153 Pressure ulcer of sacral region, stage 3: Secondary | ICD-10-CM | POA: Diagnosis not present

## 2017-10-28 DIAGNOSIS — L89312 Pressure ulcer of right buttock, stage 2: Secondary | ICD-10-CM | POA: Diagnosis not present

## 2017-10-28 DIAGNOSIS — D709 Neutropenia, unspecified: Secondary | ICD-10-CM | POA: Diagnosis not present

## 2017-10-28 DIAGNOSIS — Z452 Encounter for adjustment and management of vascular access device: Secondary | ICD-10-CM | POA: Diagnosis not present

## 2017-10-28 DIAGNOSIS — N179 Acute kidney failure, unspecified: Secondary | ICD-10-CM | POA: Diagnosis not present

## 2017-10-28 DIAGNOSIS — T83592A Infection and inflammatory reaction due to indwelling ureteral stent, initial encounter: Secondary | ICD-10-CM | POA: Diagnosis not present

## 2017-10-28 DIAGNOSIS — B9562 Methicillin resistant Staphylococcus aureus infection as the cause of diseases classified elsewhere: Secondary | ICD-10-CM | POA: Diagnosis not present

## 2017-10-28 DIAGNOSIS — E039 Hypothyroidism, unspecified: Secondary | ICD-10-CM | POA: Diagnosis not present

## 2017-10-31 ENCOUNTER — Encounter (HOSPITAL_COMMUNITY): Payer: Self-pay

## 2017-10-31 ENCOUNTER — Telehealth: Payer: Self-pay | Admitting: *Deleted

## 2017-10-31 DIAGNOSIS — E860 Dehydration: Secondary | ICD-10-CM | POA: Diagnosis not present

## 2017-10-31 NOTE — Telephone Encounter (Signed)
Received authorization approval via [employer] for Skilled Nursing by [place]; forwarded to provider/SLS 04/01

## 2017-11-01 ENCOUNTER — Encounter (HOSPITAL_COMMUNITY): Payer: Self-pay

## 2017-11-02 DIAGNOSIS — L89153 Pressure ulcer of sacral region, stage 3: Secondary | ICD-10-CM | POA: Diagnosis not present

## 2017-11-02 DIAGNOSIS — D709 Neutropenia, unspecified: Secondary | ICD-10-CM | POA: Diagnosis not present

## 2017-11-02 DIAGNOSIS — D631 Anemia in chronic kidney disease: Secondary | ICD-10-CM | POA: Diagnosis not present

## 2017-11-02 DIAGNOSIS — T82594D Other mechanical complication of infusion catheter, subsequent encounter: Secondary | ICD-10-CM | POA: Diagnosis not present

## 2017-11-02 DIAGNOSIS — B9562 Methicillin resistant Staphylococcus aureus infection as the cause of diseases classified elsewhere: Secondary | ICD-10-CM | POA: Diagnosis not present

## 2017-11-03 ENCOUNTER — Telehealth: Payer: Self-pay | Admitting: Family Medicine

## 2017-11-03 DIAGNOSIS — C541 Malignant neoplasm of endometrium: Secondary | ICD-10-CM | POA: Diagnosis not present

## 2017-11-03 DIAGNOSIS — B9562 Methicillin resistant Staphylococcus aureus infection as the cause of diseases classified elsewhere: Secondary | ICD-10-CM | POA: Diagnosis not present

## 2017-11-03 DIAGNOSIS — L89153 Pressure ulcer of sacral region, stage 3: Secondary | ICD-10-CM | POA: Diagnosis not present

## 2017-11-03 DIAGNOSIS — Z452 Encounter for adjustment and management of vascular access device: Secondary | ICD-10-CM | POA: Diagnosis not present

## 2017-11-03 DIAGNOSIS — L89312 Pressure ulcer of right buttock, stage 2: Secondary | ICD-10-CM | POA: Diagnosis not present

## 2017-11-03 DIAGNOSIS — N182 Chronic kidney disease, stage 2 (mild): Secondary | ICD-10-CM | POA: Diagnosis not present

## 2017-11-03 DIAGNOSIS — T83592A Infection and inflammatory reaction due to indwelling ureteral stent, initial encounter: Secondary | ICD-10-CM | POA: Diagnosis not present

## 2017-11-03 DIAGNOSIS — L89311 Pressure ulcer of right buttock, stage 1: Secondary | ICD-10-CM | POA: Diagnosis not present

## 2017-11-03 DIAGNOSIS — E1122 Type 2 diabetes mellitus with diabetic chronic kidney disease: Secondary | ICD-10-CM | POA: Diagnosis not present

## 2017-11-03 DIAGNOSIS — T82594D Other mechanical complication of infusion catheter, subsequent encounter: Secondary | ICD-10-CM | POA: Diagnosis not present

## 2017-11-03 DIAGNOSIS — D709 Neutropenia, unspecified: Secondary | ICD-10-CM | POA: Diagnosis not present

## 2017-11-03 DIAGNOSIS — D631 Anemia in chronic kidney disease: Secondary | ICD-10-CM | POA: Diagnosis not present

## 2017-11-03 DIAGNOSIS — N179 Acute kidney failure, unspecified: Secondary | ICD-10-CM | POA: Diagnosis not present

## 2017-11-03 NOTE — Telephone Encounter (Signed)
Copied from [place]. Topic: Quick Communication - See Telephone Encounter >> Nov 03, 2017 11:38 AM Robina Ade, Helene Kelp D wrote: CRM for notification. See Telephone encounter for: 11/03/17. Patient called and said that she would like to talk to Dr. Etter Sjogren or her CMA about if she needs to continue on taking Lyrica 75 mg? Please call patient back, thanks.

## 2017-11-04 DIAGNOSIS — L89309 Pressure ulcer of unspecified buttock, unspecified stage: Secondary | ICD-10-CM | POA: Diagnosis not present

## 2017-11-04 NOTE — Telephone Encounter (Signed)
Please call pt

## 2017-11-07 ENCOUNTER — Other Ambulatory Visit: Payer: Self-pay | Admitting: Family Medicine

## 2017-11-07 DIAGNOSIS — R52 Pain, unspecified: Secondary | ICD-10-CM

## 2017-11-07 NOTE — Telephone Encounter (Signed)
Copied from [place] [id number]. Topic: Quick Communication - Rx Refill/Question >> Nov 07, 2017  8:40 AM Arletha Grippe wrote: Medication: fentanyl 15 mcg/ per hour  Has the patient contacted their pharmacy? No. (Agent: If no, request that the patient contact the pharmacy for the refill.) Preferred Pharmacy (with phone number or street name):[facility]  [address]  Agent: Please be advised that RX refills may take up to 3 business days. We ask that you follow-up with your pharmacy. Please call if pick up needed

## 2017-11-07 NOTE — Telephone Encounter (Signed)
Rx refill request: fentanyl 25 mcg  LOV: 10/20/17  PCP: [place]: [employer]

## 2017-11-08 ENCOUNTER — Other Ambulatory Visit: Payer: Self-pay | Admitting: Family Medicine

## 2017-11-08 ENCOUNTER — Encounter: Payer: Self-pay | Admitting: Hematology and Oncology

## 2017-11-08 DIAGNOSIS — R52 Pain, unspecified: Secondary | ICD-10-CM

## 2017-11-08 MED ORDER — PREGABALIN 50 MG PO CAPS: 50.0000 mg | ORAL_CAPSULE | Freq: Two times a day (BID) | ORAL | 1 refills | Status: DC

## 2017-11-08 MED ORDER — FENTANYL 25 MCG/HR TD PT72: 25.0000 ug | MEDICATED_PATCH | TRANSDERMAL | 0 refills | Status: DC

## 2017-11-08 NOTE — Telephone Encounter (Signed)
Patient notified that rxs have been sent in. 

## 2017-11-08 NOTE — Progress Notes (Signed)
Received message regarding auth for patient's Neupogen.  Received letter via fax from [place]. Called patient to ask if she was aware of the insurance conflict with the prescription. She states she would not but was certain physician would handle. Advised her she would receive the same letter from Agency but I would give to physician,  Gave letter to Dr.Gorsuch to handle.

## 2017-11-08 NOTE — Telephone Encounter (Signed)
Was recently discharged from skilled nursing facility because of kidney problems.  They sent her home on Lyrica 50mg  bid and duregesic patch 50mg  for pain control.  She has ulceration on buttocks from them not turning her in the facility and pain from the nephrostomy tube.  She is no longer taking dilaudid.  She is only taking the lyrica, patches and tylenol.  She wants to know if you can refill the Lyrica and patches?

## 2017-11-08 NOTE — Telephone Encounter (Signed)
I sent both in --- please d/c dilaudid

## 2017-11-09 DIAGNOSIS — Z452 Encounter for adjustment and management of vascular access device: Secondary | ICD-10-CM | POA: Diagnosis not present

## 2017-11-09 DIAGNOSIS — N179 Acute kidney failure, unspecified: Secondary | ICD-10-CM | POA: Diagnosis not present

## 2017-11-09 DIAGNOSIS — D709 Neutropenia, unspecified: Secondary | ICD-10-CM | POA: Diagnosis not present

## 2017-11-09 DIAGNOSIS — T82594D Other mechanical complication of infusion catheter, subsequent encounter: Secondary | ICD-10-CM | POA: Diagnosis not present

## 2017-11-09 DIAGNOSIS — Z5181 Encounter for therapeutic drug level monitoring: Secondary | ICD-10-CM | POA: Diagnosis not present

## 2017-11-09 DIAGNOSIS — D631 Anemia in chronic kidney disease: Secondary | ICD-10-CM | POA: Diagnosis not present

## 2017-11-10 ENCOUNTER — Encounter: Payer: Self-pay | Admitting: Family Medicine

## 2017-11-10 DIAGNOSIS — L89312 Pressure ulcer of right buttock, stage 2: Secondary | ICD-10-CM | POA: Diagnosis not present

## 2017-11-10 DIAGNOSIS — N182 Chronic kidney disease, stage 2 (mild): Secondary | ICD-10-CM | POA: Diagnosis not present

## 2017-11-10 DIAGNOSIS — T82594D Other mechanical complication of infusion catheter, subsequent encounter: Secondary | ICD-10-CM | POA: Diagnosis not present

## 2017-11-10 DIAGNOSIS — L89311 Pressure ulcer of right buttock, stage 1: Secondary | ICD-10-CM | POA: Diagnosis not present

## 2017-11-10 DIAGNOSIS — L89153 Pressure ulcer of sacral region, stage 3: Secondary | ICD-10-CM | POA: Diagnosis not present

## 2017-11-10 DIAGNOSIS — B9562 Methicillin resistant Staphylococcus aureus infection as the cause of diseases classified elsewhere: Secondary | ICD-10-CM | POA: Diagnosis not present

## 2017-11-10 DIAGNOSIS — D709 Neutropenia, unspecified: Secondary | ICD-10-CM | POA: Diagnosis not present

## 2017-11-10 DIAGNOSIS — C541 Malignant neoplasm of endometrium: Secondary | ICD-10-CM | POA: Diagnosis not present

## 2017-11-10 DIAGNOSIS — N179 Acute kidney failure, unspecified: Secondary | ICD-10-CM | POA: Diagnosis not present

## 2017-11-10 DIAGNOSIS — Z452 Encounter for adjustment and management of vascular access device: Secondary | ICD-10-CM | POA: Diagnosis not present

## 2017-11-10 DIAGNOSIS — E1122 Type 2 diabetes mellitus with diabetic chronic kidney disease: Secondary | ICD-10-CM | POA: Diagnosis not present

## 2017-11-10 DIAGNOSIS — D631 Anemia in chronic kidney disease: Secondary | ICD-10-CM | POA: Diagnosis not present

## 2017-11-10 DIAGNOSIS — T83592A Infection and inflammatory reaction due to indwelling ureteral stent, initial encounter: Secondary | ICD-10-CM | POA: Diagnosis not present

## 2017-11-14 ENCOUNTER — Other Ambulatory Visit: Payer: Self-pay | Admitting: Urology

## 2017-11-14 DIAGNOSIS — L89311 Pressure ulcer of right buttock, stage 1: Secondary | ICD-10-CM | POA: Diagnosis not present

## 2017-11-14 DIAGNOSIS — T83592A Infection and inflammatory reaction due to indwelling ureteral stent, initial encounter: Secondary | ICD-10-CM | POA: Diagnosis not present

## 2017-11-14 DIAGNOSIS — C541 Malignant neoplasm of endometrium: Secondary | ICD-10-CM | POA: Diagnosis not present

## 2017-11-14 DIAGNOSIS — Z933 Colostomy status: Secondary | ICD-10-CM | POA: Diagnosis not present

## 2017-11-14 DIAGNOSIS — L89312 Pressure ulcer of right buttock, stage 2: Secondary | ICD-10-CM | POA: Diagnosis not present

## 2017-11-14 DIAGNOSIS — B9562 Methicillin resistant Staphylococcus aureus infection as the cause of diseases classified elsewhere: Secondary | ICD-10-CM | POA: Diagnosis not present

## 2017-11-14 DIAGNOSIS — T82594D Other mechanical complication of infusion catheter, subsequent encounter: Secondary | ICD-10-CM | POA: Diagnosis not present

## 2017-11-14 DIAGNOSIS — Z452 Encounter for adjustment and management of vascular access device: Secondary | ICD-10-CM | POA: Diagnosis not present

## 2017-11-14 DIAGNOSIS — N182 Chronic kidney disease, stage 2 (mild): Secondary | ICD-10-CM | POA: Diagnosis not present

## 2017-11-14 DIAGNOSIS — S3720XD Unspecified injury of bladder, subsequent encounter: Secondary | ICD-10-CM

## 2017-11-14 DIAGNOSIS — N179 Acute kidney failure, unspecified: Secondary | ICD-10-CM | POA: Diagnosis not present

## 2017-11-14 DIAGNOSIS — N135 Crossing vessel and stricture of ureter without hydronephrosis: Secondary | ICD-10-CM

## 2017-11-14 DIAGNOSIS — D709 Neutropenia, unspecified: Secondary | ICD-10-CM | POA: Diagnosis not present

## 2017-11-14 DIAGNOSIS — L89153 Pressure ulcer of sacral region, stage 3: Secondary | ICD-10-CM | POA: Diagnosis not present

## 2017-11-14 DIAGNOSIS — N1330 Unspecified hydronephrosis: Secondary | ICD-10-CM | POA: Diagnosis not present

## 2017-11-14 DIAGNOSIS — D631 Anemia in chronic kidney disease: Secondary | ICD-10-CM | POA: Diagnosis not present

## 2017-11-14 DIAGNOSIS — E1122 Type 2 diabetes mellitus with diabetic chronic kidney disease: Secondary | ICD-10-CM | POA: Diagnosis not present

## 2017-11-14 DIAGNOSIS — S3720XA Unspecified injury of bladder, initial encounter: Secondary | ICD-10-CM | POA: Diagnosis not present

## 2017-11-14 DIAGNOSIS — L89319 Pressure ulcer of right buttock, unspecified stage: Secondary | ICD-10-CM | POA: Diagnosis not present

## 2017-11-15 DIAGNOSIS — R32 Unspecified urinary incontinence: Secondary | ICD-10-CM | POA: Diagnosis not present

## 2017-11-15 DIAGNOSIS — C541 Malignant neoplasm of endometrium: Secondary | ICD-10-CM | POA: Diagnosis not present

## 2017-11-15 DIAGNOSIS — Z9889 Other specified postprocedural states: Secondary | ICD-10-CM | POA: Diagnosis not present

## 2017-11-15 DIAGNOSIS — Z932 Ileostomy status: Secondary | ICD-10-CM | POA: Diagnosis not present

## 2017-11-16 DIAGNOSIS — T82594D Other mechanical complication of infusion catheter, subsequent encounter: Secondary | ICD-10-CM | POA: Diagnosis not present

## 2017-11-16 DIAGNOSIS — E860 Dehydration: Secondary | ICD-10-CM | POA: Diagnosis not present

## 2017-11-16 DIAGNOSIS — Z452 Encounter for adjustment and management of vascular access device: Secondary | ICD-10-CM | POA: Diagnosis not present

## 2017-11-16 DIAGNOSIS — N179 Acute kidney failure, unspecified: Secondary | ICD-10-CM | POA: Diagnosis not present

## 2017-11-16 DIAGNOSIS — Z5181 Encounter for therapeutic drug level monitoring: Secondary | ICD-10-CM | POA: Diagnosis not present

## 2017-11-21 ENCOUNTER — Telehealth: Payer: Self-pay | Admitting: Pharmacy Technician

## 2017-11-21 ENCOUNTER — Telehealth: Payer: Self-pay | Admitting: *Deleted

## 2017-11-21 DIAGNOSIS — N321 Vesicointestinal fistula: Secondary | ICD-10-CM | POA: Diagnosis not present

## 2017-11-21 NOTE — Telephone Encounter (Signed)
Oral Oncology Patient Advocate Encounter  Received a call from the patient that she was having issues obtaining a refill of Neupogen from her pharmacy.    I contacted [place] for clarification.    The patient's insurance company is in need of further clarification as to why Neupogen is being prescribed over an alternative medication (see 4/9 Documentation encounter).   Fabio Asa. Melynda [name], [place] [facility] [place] [phone] 11/21/2017 4:11 PM

## 2017-11-21 NOTE — Telephone Encounter (Signed)
Fax path reports to Dr. Floria Raveling office per medical release form

## 2017-11-22 ENCOUNTER — Other Ambulatory Visit: Payer: Self-pay | Admitting: Family Medicine

## 2017-11-22 ENCOUNTER — Telehealth: Payer: Self-pay | Admitting: Family Medicine

## 2017-11-22 ENCOUNTER — Telehealth: Payer: Self-pay | Admitting: *Deleted

## 2017-11-22 DIAGNOSIS — L89319 Pressure ulcer of right buttock, unspecified stage: Secondary | ICD-10-CM | POA: Diagnosis not present

## 2017-11-22 DIAGNOSIS — G893 Neoplasm related pain (acute) (chronic): Secondary | ICD-10-CM

## 2017-11-22 DIAGNOSIS — Z933 Colostomy status: Secondary | ICD-10-CM | POA: Diagnosis not present

## 2017-11-22 MED ORDER — FENTANYL 50 MCG/HR TD PT72: 50.0000 ug | MEDICATED_PATCH | TRANSDERMAL | 0 refills | Status: AC

## 2017-11-22 NOTE — Telephone Encounter (Signed)
50 mcg sent in and she should not drive with the patches

## 2017-11-22 NOTE — Telephone Encounter (Signed)
Patient requesting fentayl patches.  She usually get the 47mcg but she wants to increase to 86mcg  Database ran and is on your desk for review.  Last filled per database: 11/08/17 Last written: 11/08/17 Last ov: 11/08/17 Next ov: none Contract: none UDS: none

## 2017-11-22 NOTE — Telephone Encounter (Signed)
Copied from [place] [id number]. Topic: General - Other >> Nov 18, 2017  2:08 PM Yvette Rack wrote: Reason for CRM: Clair Gulling pt husband calling to speak with Dr Etter Sjogren about his wife is still having pain while she is on the fentaNYL (Fidelity) 25 MCG/HR patch and would like to up the dosage to 72mcg please call husband Clair Gulling [phone]

## 2017-11-22 NOTE — Telephone Encounter (Signed)
Copied from [place] [phone]. Topic: Quick Communication - See Telephone Encounter >> Nov 22, 2017  2:36 PM Rutherford Nail, NT wrote: CRM for notification. See Telephone encounter for: 11/22/17. Patient calling and is wanting to know if a prescription for her fentaNYL (DURAGESIC) 25 MCG/HR patch could be sent in. She is wondering if the medication can be moved up to 25mcg? States that she is still in pain with the 32mcg.  Worried about her pain control. States that on Monday 11/28/17 she is having her nephrostomy tubes replaced. If nothing is sent, she will run out of patches this weekend. Also, states she wanted to let Dr Carollee Herter know that she will be having a blood transfusion tomorrow (11/23/17) at short stay because her hemoglobin is 6.something.  Also wants to know if it is safe to drive with the patch? CB#: [phone]

## 2017-11-23 DIAGNOSIS — Z5181 Encounter for therapeutic drug level monitoring: Secondary | ICD-10-CM | POA: Diagnosis not present

## 2017-11-23 DIAGNOSIS — Z452 Encounter for adjustment and management of vascular access device: Secondary | ICD-10-CM | POA: Diagnosis not present

## 2017-11-23 DIAGNOSIS — N179 Acute kidney failure, unspecified: Secondary | ICD-10-CM | POA: Diagnosis not present

## 2017-11-23 DIAGNOSIS — T82594D Other mechanical complication of infusion catheter, subsequent encounter: Secondary | ICD-10-CM | POA: Diagnosis not present

## 2017-11-23 NOTE — Telephone Encounter (Signed)
Patient notified

## 2017-11-23 NOTE — Telephone Encounter (Signed)
See other phone message  

## 2017-11-24 ENCOUNTER — Other Ambulatory Visit (HOSPITAL_COMMUNITY): Payer: Self-pay

## 2017-11-24 ENCOUNTER — Other Ambulatory Visit (HOSPITAL_COMMUNITY): Payer: Self-pay | Admitting: *Deleted

## 2017-11-24 DIAGNOSIS — D631 Anemia in chronic kidney disease: Secondary | ICD-10-CM | POA: Diagnosis not present

## 2017-11-24 DIAGNOSIS — N182 Chronic kidney disease, stage 2 (mild): Secondary | ICD-10-CM | POA: Diagnosis not present

## 2017-11-24 DIAGNOSIS — N179 Acute kidney failure, unspecified: Secondary | ICD-10-CM | POA: Diagnosis not present

## 2017-11-25 ENCOUNTER — Ambulatory Visit (HOSPITAL_COMMUNITY)
Admission: RE | Admit: 2017-11-25 | Discharge: 2017-11-25 | Disposition: A | Discharge status: Home / Self Care | Payer: BLUE CROSS/BLUE SHIELD | Source: Ambulatory Visit | Attending: Nephrology | Admitting: Nephrology

## 2017-11-25 DIAGNOSIS — D631 Anemia in chronic kidney disease: Secondary | ICD-10-CM | POA: Diagnosis not present

## 2017-11-25 DIAGNOSIS — N189 Chronic kidney disease, unspecified: Secondary | ICD-10-CM | POA: Diagnosis not present

## 2017-11-25 LAB — PREPARE RBC (CROSSMATCH)

## 2017-11-25 MED ORDER — HEPARIN SOD (PORK) LOCK FLUSH 100 UNIT/ML IV SOLN
INTRAVENOUS | Status: AC
  Administered 2017-11-25: 250 [IU]
  Filled 2017-11-25: qty 5

## 2017-11-25 MED ORDER — SODIUM CHLORIDE 0.9 % IV SOLN: Freq: Once | INTRAVENOUS | Status: DC

## 2017-11-25 MED ORDER — DIPHENHYDRAMINE HCL 25 MG PO CAPS
ORAL_CAPSULE | ORAL | Status: AC
  Filled 2017-11-25: qty 1

## 2017-11-25 MED ORDER — ACETAMINOPHEN 325 MG PO TABS: 650.0000 mg | ORAL_TABLET | Freq: Once | ORAL | Status: DC

## 2017-11-25 MED ORDER — ACETAMINOPHEN 325 MG PO TABS
ORAL_TABLET | ORAL | Status: AC
  Filled 2017-11-25: qty 2

## 2017-11-25 MED ORDER — DIPHENHYDRAMINE HCL 25 MG PO CAPS
25.0000 mg | ORAL_CAPSULE | Freq: Once | ORAL | Status: AC
  Administered 2017-11-25: 25 mg via ORAL

## 2017-11-26 LAB — TYPE AND SCREEN
ABO/RH(D): A POS
Antibody Screen: NEGATIVE
UNIT DIVISION: 0

## 2017-11-26 LAB — BPAM RBC
Blood Product Expiration Date: 201905032359
ISSUE DATE / TIME: 201904261003
UNIT TYPE AND RH: 6200

## 2017-11-28 ENCOUNTER — Other Ambulatory Visit: Payer: Self-pay | Admitting: Urology

## 2017-11-28 ENCOUNTER — Ambulatory Visit (HOSPITAL_COMMUNITY)
Admission: RE | Admit: 2017-11-28 | Discharge: 2017-11-28 | Disposition: A | Discharge status: Home / Self Care | Payer: BLUE CROSS/BLUE SHIELD | Source: Ambulatory Visit | Attending: Urology | Admitting: Urology

## 2017-11-28 ENCOUNTER — Encounter (HOSPITAL_COMMUNITY): Payer: Self-pay | Admitting: Interventional Radiology

## 2017-11-28 ENCOUNTER — Other Ambulatory Visit: Payer: Self-pay | Admitting: *Deleted

## 2017-11-28 DIAGNOSIS — Z8041 Family history of malignant neoplasm of ovary: Secondary | ICD-10-CM | POA: Diagnosis not present

## 2017-11-28 DIAGNOSIS — E860 Dehydration: Secondary | ICD-10-CM | POA: Diagnosis not present

## 2017-11-28 DIAGNOSIS — C50212 Malignant neoplasm of upper-inner quadrant of left female breast: Secondary | ICD-10-CM | POA: Diagnosis not present

## 2017-11-28 DIAGNOSIS — N184 Chronic kidney disease, stage 4 (severe): Secondary | ICD-10-CM | POA: Diagnosis not present

## 2017-11-28 DIAGNOSIS — Z936 Other artificial openings of urinary tract status: Secondary | ICD-10-CM | POA: Diagnosis not present

## 2017-11-28 DIAGNOSIS — Z794 Long term (current) use of insulin: Secondary | ICD-10-CM | POA: Diagnosis not present

## 2017-11-28 DIAGNOSIS — S3720XD Unspecified injury of bladder, subsequent encounter: Secondary | ICD-10-CM

## 2017-11-28 DIAGNOSIS — R509 Fever, unspecified: Secondary | ICD-10-CM | POA: Diagnosis not present

## 2017-11-28 DIAGNOSIS — I129 Hypertensive chronic kidney disease with stage 1 through stage 4 chronic kidney disease, or unspecified chronic kidney disease: Secondary | ICD-10-CM | POA: Diagnosis present

## 2017-11-28 DIAGNOSIS — G8929 Other chronic pain: Secondary | ICD-10-CM | POA: Diagnosis not present

## 2017-11-28 DIAGNOSIS — Z9221 Personal history of antineoplastic chemotherapy: Secondary | ICD-10-CM | POA: Diagnosis not present

## 2017-11-28 DIAGNOSIS — E785 Hyperlipidemia, unspecified: Secondary | ICD-10-CM | POA: Diagnosis not present

## 2017-11-28 DIAGNOSIS — Z8543 Personal history of malignant neoplasm of ovary: Secondary | ICD-10-CM | POA: Diagnosis not present

## 2017-11-28 DIAGNOSIS — E782 Mixed hyperlipidemia: Secondary | ICD-10-CM | POA: Diagnosis present

## 2017-11-28 DIAGNOSIS — N183 Chronic kidney disease, stage 3 (moderate): Secondary | ICD-10-CM | POA: Diagnosis not present

## 2017-11-28 DIAGNOSIS — E1122 Type 2 diabetes mellitus with diabetic chronic kidney disease: Secondary | ICD-10-CM | POA: Diagnosis not present

## 2017-11-28 DIAGNOSIS — K760 Fatty (change of) liver, not elsewhere classified: Secondary | ICD-10-CM | POA: Diagnosis not present

## 2017-11-28 DIAGNOSIS — Z932 Ileostomy status: Secondary | ICD-10-CM | POA: Diagnosis not present

## 2017-11-28 DIAGNOSIS — E559 Vitamin D deficiency, unspecified: Secondary | ICD-10-CM | POA: Diagnosis present

## 2017-11-28 DIAGNOSIS — D649 Anemia, unspecified: Secondary | ICD-10-CM | POA: Diagnosis not present

## 2017-11-28 DIAGNOSIS — A419 Sepsis, unspecified organism: Secondary | ICD-10-CM | POA: Diagnosis not present

## 2017-11-28 DIAGNOSIS — L89309 Pressure ulcer of unspecified buttock, unspecified stage: Secondary | ICD-10-CM | POA: Diagnosis not present

## 2017-11-28 DIAGNOSIS — Z923 Personal history of irradiation: Secondary | ICD-10-CM | POA: Diagnosis not present

## 2017-11-28 DIAGNOSIS — K449 Diaphragmatic hernia without obstruction or gangrene: Secondary | ICD-10-CM | POA: Diagnosis present

## 2017-11-28 DIAGNOSIS — N135 Crossing vessel and stricture of ureter without hydronephrosis: Secondary | ICD-10-CM

## 2017-11-28 DIAGNOSIS — K219 Gastro-esophageal reflux disease without esophagitis: Secondary | ICD-10-CM | POA: Diagnosis not present

## 2017-11-28 DIAGNOSIS — Z853 Personal history of malignant neoplasm of breast: Secondary | ICD-10-CM | POA: Diagnosis not present

## 2017-11-28 DIAGNOSIS — Z436 Encounter for attention to other artificial openings of urinary tract: Secondary | ICD-10-CM

## 2017-11-28 DIAGNOSIS — L89152 Pressure ulcer of sacral region, stage 2: Secondary | ICD-10-CM | POA: Diagnosis not present

## 2017-11-28 DIAGNOSIS — Z9013 Acquired absence of bilateral breasts and nipples: Secondary | ICD-10-CM | POA: Diagnosis not present

## 2017-11-28 DIAGNOSIS — Z4682 Encounter for fitting and adjustment of non-vascular catheter: Secondary | ICD-10-CM | POA: Diagnosis not present

## 2017-11-28 DIAGNOSIS — Z79890 Hormone replacement therapy (postmenopausal): Secondary | ICD-10-CM | POA: Diagnosis not present

## 2017-11-28 DIAGNOSIS — N10 Acute tubulo-interstitial nephritis: Secondary | ICD-10-CM | POA: Diagnosis not present

## 2017-11-28 DIAGNOSIS — E039 Hypothyroidism, unspecified: Secondary | ICD-10-CM | POA: Diagnosis present

## 2017-11-28 DIAGNOSIS — E11319 Type 2 diabetes mellitus with unspecified diabetic retinopathy without macular edema: Secondary | ICD-10-CM | POA: Diagnosis not present

## 2017-11-28 DIAGNOSIS — K651 Peritoneal abscess: Secondary | ICD-10-CM | POA: Diagnosis not present

## 2017-11-28 DIAGNOSIS — Z881 Allergy status to other antibiotic agents status: Secondary | ICD-10-CM | POA: Diagnosis not present

## 2017-11-28 DIAGNOSIS — Z90710 Acquired absence of both cervix and uterus: Secondary | ICD-10-CM | POA: Diagnosis not present

## 2017-11-28 DIAGNOSIS — T8384XA Pain from genitourinary prosthetic devices, implants and grafts, initial encounter: Secondary | ICD-10-CM | POA: Diagnosis not present

## 2017-11-28 DIAGNOSIS — N12 Tubulo-interstitial nephritis, not specified as acute or chronic: Secondary | ICD-10-CM | POA: Diagnosis not present

## 2017-11-28 DIAGNOSIS — Z170 Estrogen receptor positive status [ER+]: Secondary | ICD-10-CM | POA: Diagnosis not present

## 2017-11-28 DIAGNOSIS — C541 Malignant neoplasm of endometrium: Secondary | ICD-10-CM | POA: Diagnosis not present

## 2017-11-28 HISTORY — PX: IR NEPHROSTOMY EXCHANGE LEFT: IMG6069

## 2017-11-28 HISTORY — PX: IR NEPHROSTOMY EXCHANGE RIGHT: IMG6070

## 2017-11-28 MED ORDER — TBO-FILGRASTIM 480 MCG/0.8ML ~~LOC~~ SOSY: 480.0000 ug | PREFILLED_SYRINGE | Freq: Every day | SUBCUTANEOUS | 11 refills | Status: DC

## 2017-11-28 MED ORDER — LIDOCAINE HCL 1 % IJ SOLN
INTRAMUSCULAR | Status: AC
  Filled 2017-11-28: qty 20

## 2017-11-28 MED ORDER — IOPAMIDOL (ISOVUE-300) INJECTION 61%
50.0000 mL | Freq: Once | INTRAVENOUS | Status: AC | PRN
  Administered 2017-11-28: 15 mL

## 2017-11-28 MED ORDER — IOPAMIDOL (ISOVUE-300) INJECTION 61%
INTRAVENOUS | Status: AC
  Administered 2017-11-28: 15 mL
  Filled 2017-11-28: qty 50

## 2017-11-28 NOTE — Telephone Encounter (Signed)
I did not receive the statement regarding biosimilar We will try to get biosimilar approved

## 2017-11-28 NOTE — Telephone Encounter (Signed)
Husband brought documentation from [facility] showing denial of Neupogen. Pt has to have tried Granix.  Dr Alvy Bimler states to escribe Granix 480 for daily injection. Prescription sent to [facility]

## 2017-11-29 ENCOUNTER — Inpatient Hospital Stay (HOSPITAL_COMMUNITY): Payer: BLUE CROSS/BLUE SHIELD

## 2017-11-29 ENCOUNTER — Other Ambulatory Visit: Payer: Self-pay

## 2017-11-29 ENCOUNTER — Inpatient Hospital Stay (HOSPITAL_COMMUNITY)
Admission: EM | Admit: 2017-11-29 | Discharge: 2017-12-01 | DRG: 872 | Disposition: A | Discharge status: Home / Self Care | Payer: BLUE CROSS/BLUE SHIELD | Attending: Internal Medicine | Admitting: Internal Medicine

## 2017-11-29 ENCOUNTER — Emergency Department (HOSPITAL_COMMUNITY): Payer: BLUE CROSS/BLUE SHIELD

## 2017-11-29 ENCOUNTER — Encounter (HOSPITAL_COMMUNITY): Payer: Self-pay | Admitting: *Deleted

## 2017-11-29 DIAGNOSIS — D631 Anemia in chronic kidney disease: Secondary | ICD-10-CM | POA: Diagnosis present

## 2017-11-29 DIAGNOSIS — N183 Chronic kidney disease, stage 3 unspecified: Secondary | ICD-10-CM | POA: Diagnosis present

## 2017-11-29 DIAGNOSIS — L89152 Pressure ulcer of sacral region, stage 2: Secondary | ICD-10-CM | POA: Diagnosis present

## 2017-11-29 DIAGNOSIS — Z9221 Personal history of antineoplastic chemotherapy: Secondary | ICD-10-CM | POA: Diagnosis not present

## 2017-11-29 DIAGNOSIS — Z90710 Acquired absence of both cervix and uterus: Secondary | ICD-10-CM

## 2017-11-29 DIAGNOSIS — K449 Diaphragmatic hernia without obstruction or gangrene: Secondary | ICD-10-CM | POA: Diagnosis present

## 2017-11-29 DIAGNOSIS — N10 Acute tubulo-interstitial nephritis: Secondary | ICD-10-CM | POA: Diagnosis not present

## 2017-11-29 DIAGNOSIS — E039 Hypothyroidism, unspecified: Secondary | ICD-10-CM | POA: Diagnosis not present

## 2017-11-29 DIAGNOSIS — Z794 Long term (current) use of insulin: Secondary | ICD-10-CM | POA: Diagnosis not present

## 2017-11-29 DIAGNOSIS — N184 Chronic kidney disease, stage 4 (severe): Secondary | ICD-10-CM | POA: Diagnosis present

## 2017-11-29 DIAGNOSIS — Z923 Personal history of irradiation: Secondary | ICD-10-CM | POA: Diagnosis not present

## 2017-11-29 DIAGNOSIS — K219 Gastro-esophageal reflux disease without esophagitis: Secondary | ICD-10-CM | POA: Diagnosis not present

## 2017-11-29 DIAGNOSIS — R509 Fever, unspecified: Secondary | ICD-10-CM | POA: Diagnosis not present

## 2017-11-29 DIAGNOSIS — G8929 Other chronic pain: Secondary | ICD-10-CM | POA: Diagnosis present

## 2017-11-29 DIAGNOSIS — Z853 Personal history of malignant neoplasm of breast: Secondary | ICD-10-CM | POA: Diagnosis not present

## 2017-11-29 DIAGNOSIS — E785 Hyperlipidemia, unspecified: Secondary | ICD-10-CM | POA: Diagnosis present

## 2017-11-29 DIAGNOSIS — E1122 Type 2 diabetes mellitus with diabetic chronic kidney disease: Secondary | ICD-10-CM

## 2017-11-29 DIAGNOSIS — K760 Fatty (change of) liver, not elsewhere classified: Secondary | ICD-10-CM | POA: Diagnosis present

## 2017-11-29 DIAGNOSIS — Z9049 Acquired absence of other specified parts of digestive tract: Secondary | ICD-10-CM

## 2017-11-29 DIAGNOSIS — D649 Anemia, unspecified: Secondary | ICD-10-CM | POA: Diagnosis not present

## 2017-11-29 DIAGNOSIS — Z8543 Personal history of malignant neoplasm of ovary: Secondary | ICD-10-CM

## 2017-11-29 DIAGNOSIS — Z808 Family history of malignant neoplasm of other organs or systems: Secondary | ICD-10-CM

## 2017-11-29 DIAGNOSIS — Z932 Ileostomy status: Secondary | ICD-10-CM | POA: Diagnosis not present

## 2017-11-29 DIAGNOSIS — Z882 Allergy status to sulfonamides status: Secondary | ICD-10-CM

## 2017-11-29 DIAGNOSIS — Z881 Allergy status to other antibiotic agents status: Secondary | ICD-10-CM

## 2017-11-29 DIAGNOSIS — E860 Dehydration: Secondary | ICD-10-CM | POA: Diagnosis not present

## 2017-11-29 DIAGNOSIS — Z8249 Family history of ischemic heart disease and other diseases of the circulatory system: Secondary | ICD-10-CM

## 2017-11-29 DIAGNOSIS — C541 Malignant neoplasm of endometrium: Secondary | ICD-10-CM | POA: Diagnosis present

## 2017-11-29 DIAGNOSIS — N12 Tubulo-interstitial nephritis, not specified as acute or chronic: Secondary | ICD-10-CM

## 2017-11-29 DIAGNOSIS — Z936 Other artificial openings of urinary tract status: Secondary | ICD-10-CM | POA: Diagnosis not present

## 2017-11-29 DIAGNOSIS — Z91048 Other nonmedicinal substance allergy status: Secondary | ICD-10-CM

## 2017-11-29 DIAGNOSIS — K651 Peritoneal abscess: Secondary | ICD-10-CM | POA: Diagnosis not present

## 2017-11-29 DIAGNOSIS — Z170 Estrogen receptor positive status [ER+]: Secondary | ICD-10-CM | POA: Diagnosis not present

## 2017-11-29 DIAGNOSIS — D509 Iron deficiency anemia, unspecified: Secondary | ICD-10-CM | POA: Diagnosis present

## 2017-11-29 DIAGNOSIS — Z79890 Hormone replacement therapy (postmenopausal): Secondary | ICD-10-CM

## 2017-11-29 DIAGNOSIS — Z833 Family history of diabetes mellitus: Secondary | ICD-10-CM

## 2017-11-29 DIAGNOSIS — Z885 Allergy status to narcotic agent status: Secondary | ICD-10-CM

## 2017-11-29 DIAGNOSIS — E559 Vitamin D deficiency, unspecified: Secondary | ICD-10-CM | POA: Diagnosis present

## 2017-11-29 DIAGNOSIS — E782 Mixed hyperlipidemia: Secondary | ICD-10-CM | POA: Diagnosis present

## 2017-11-29 DIAGNOSIS — Z803 Family history of malignant neoplasm of breast: Secondary | ICD-10-CM

## 2017-11-29 DIAGNOSIS — I129 Hypertensive chronic kidney disease with stage 1 through stage 4 chronic kidney disease, or unspecified chronic kidney disease: Secondary | ICD-10-CM | POA: Diagnosis present

## 2017-11-29 DIAGNOSIS — Z800 Family history of malignant neoplasm of digestive organs: Secondary | ICD-10-CM

## 2017-11-29 DIAGNOSIS — E1129 Type 2 diabetes mellitus with other diabetic kidney complication: Secondary | ICD-10-CM | POA: Diagnosis present

## 2017-11-29 DIAGNOSIS — C50212 Malignant neoplasm of upper-inner quadrant of left female breast: Secondary | ICD-10-CM | POA: Diagnosis present

## 2017-11-29 DIAGNOSIS — D3502 Benign neoplasm of left adrenal gland: Secondary | ICD-10-CM | POA: Diagnosis present

## 2017-11-29 DIAGNOSIS — D630 Anemia in neoplastic disease: Secondary | ICD-10-CM | POA: Diagnosis present

## 2017-11-29 DIAGNOSIS — Z8041 Family history of malignant neoplasm of ovary: Secondary | ICD-10-CM | POA: Diagnosis not present

## 2017-11-29 DIAGNOSIS — Z807 Family history of other malignant neoplasms of lymphoid, hematopoietic and related tissues: Secondary | ICD-10-CM

## 2017-11-29 DIAGNOSIS — Z880 Allergy status to penicillin: Secondary | ICD-10-CM

## 2017-11-29 DIAGNOSIS — Z888 Allergy status to other drugs, medicaments and biological substances status: Secondary | ICD-10-CM

## 2017-11-29 DIAGNOSIS — E11319 Type 2 diabetes mellitus with unspecified diabetic retinopathy without macular edema: Secondary | ICD-10-CM | POA: Diagnosis present

## 2017-11-29 DIAGNOSIS — D709 Neutropenia, unspecified: Secondary | ICD-10-CM | POA: Diagnosis present

## 2017-11-29 DIAGNOSIS — Z9013 Acquired absence of bilateral breasts and nipples: Secondary | ICD-10-CM

## 2017-11-29 DIAGNOSIS — A419 Sepsis, unspecified organism: Secondary | ICD-10-CM | POA: Diagnosis not present

## 2017-11-29 DIAGNOSIS — K317 Polyp of stomach and duodenum: Secondary | ICD-10-CM | POA: Diagnosis present

## 2017-11-29 DIAGNOSIS — Z4682 Encounter for fitting and adjustment of non-vascular catheter: Secondary | ICD-10-CM | POA: Diagnosis not present

## 2017-11-29 HISTORY — PX: IR NEPHROSTOGRAM RIGHT THRU EXISTING ACCESS: IMG6062

## 2017-11-29 HISTORY — PX: IR NEPHROSTOGRAM LEFT THRU EXISTING ACCESS: IMG6061

## 2017-11-29 LAB — CBC WITH DIFFERENTIAL/PLATELET
BASOS PCT: 1 %
Basophils Absolute: 0 10*3/uL (ref 0.0–0.1)
EOS PCT: 5 %
Eosinophils Absolute: 0.2 10*3/uL (ref 0.0–0.7)
HEMATOCRIT: 28.4 % — AB (ref 36.0–46.0)
Hemoglobin: 8.7 g/dL — ABNORMAL LOW (ref 12.0–15.0)
LYMPHS ABS: 0.6 10*3/uL — AB (ref 0.7–4.0)
Lymphocytes Relative: 17 %
MCH: 29.5 pg (ref 26.0–34.0)
MCHC: 30.6 g/dL (ref 30.0–36.0)
MCV: 96.3 fL (ref 78.0–100.0)
Monocytes Absolute: 0.7 10*3/uL (ref 0.1–1.0)
Monocytes Relative: 19 %
Neutro Abs: 2.1 10*3/uL (ref 1.7–7.7)
Neutrophils Relative %: 58 %
Platelets: 314 10*3/uL (ref 150–400)
RBC: 2.95 MIL/uL — AB (ref 3.87–5.11)
RDW: 17.7 % — ABNORMAL HIGH (ref 11.5–15.5)
WBC: 3.6 10*3/uL — AB (ref 4.0–10.5)

## 2017-11-29 LAB — COMPREHENSIVE METABOLIC PANEL
ALBUMIN: 2.7 g/dL — AB (ref 3.5–5.0)
ALK PHOS: 193 U/L — AB (ref 38–126)
ALT: 25 U/L (ref 14–54)
AST: 16 U/L (ref 15–41)
Anion gap: 8 (ref 5–15)
BUN: 21 mg/dL — ABNORMAL HIGH (ref 6–20)
CHLORIDE: 108 mmol/L (ref 101–111)
CO2: 22 mmol/L (ref 22–32)
CREATININE: 1.48 mg/dL — AB (ref 0.44–1.00)
Calcium: 8.5 mg/dL — ABNORMAL LOW (ref 8.9–10.3)
GFR calc non Af Amer: 37 mL/min — ABNORMAL LOW (ref >60)
GFR, EST AFRICAN AMERICAN: 42 mL/min — AB (ref >60)
GLUCOSE: 165 mg/dL — AB (ref 65–99)
Potassium: 3.7 mmol/L (ref 3.5–5.1)
SODIUM: 138 mmol/L (ref 135–145)
Total Bilirubin: 0.3 mg/dL (ref 0.3–1.2)
Total Protein: 6.6 g/dL (ref 6.5–8.1)

## 2017-11-29 LAB — URINALYSIS, ROUTINE W REFLEX MICROSCOPIC
Bilirubin Urine: NEGATIVE
GLUCOSE, UA: NEGATIVE mg/dL
KETONES UR: NEGATIVE mg/dL
Nitrite: NEGATIVE
PH: 5 (ref 5.0–8.0)
Protein, ur: 100 mg/dL — AB
RBC / HPF: >50 RBC/hpf — ABNORMAL HIGH (ref 0–5)
Specific Gravity, Urine: 1.016 (ref 1.005–1.030)

## 2017-11-29 LAB — GLUCOSE, CAPILLARY
GLUCOSE-CAPILLARY: 99 mg/dL (ref 65–99)
Glucose-Capillary: 140 mg/dL — ABNORMAL HIGH (ref 65–99)

## 2017-11-29 LAB — LACTIC ACID, PLASMA: LACTIC ACID, VENOUS: 1 mmol/L (ref 0.5–1.9)

## 2017-11-29 LAB — I-STAT CG4 LACTIC ACID, ED: Lactic Acid, Venous: 1.16 mmol/L (ref 0.5–1.9)

## 2017-11-29 LAB — PROTIME-INR
INR: 1.02
Prothrombin Time: 13.3 seconds (ref 11.4–15.2)

## 2017-11-29 LAB — PROCALCITONIN: PROCALCITONIN: 0.21 ng/mL

## 2017-11-29 LAB — CBG MONITORING, ED
Glucose-Capillary: 107 mg/dL — ABNORMAL HIGH (ref 65–99)
Glucose-Capillary: 100 mg/dL — ABNORMAL HIGH (ref 65–99)

## 2017-11-29 LAB — MRSA PCR SCREENING: MRSA by PCR: NEGATIVE

## 2017-11-29 MED ORDER — BLISTEX MEDICATED EX OINT
1.0000 "application " | TOPICAL_OINTMENT | Freq: Two times a day (BID) | CUTANEOUS | Status: DC
  Administered 2017-11-29 – 2017-12-01 (×4): 1 via TOPICAL
  Filled 2017-11-29: qty 6.3

## 2017-11-29 MED ORDER — ROSUVASTATIN CALCIUM 10 MG PO TABS
10.0000 mg | ORAL_TABLET | Freq: Every evening | ORAL | Status: DC
  Administered 2017-11-29 – 2017-11-30 (×2): 10 mg via ORAL
  Filled 2017-11-29 (×3): qty 1

## 2017-11-29 MED ORDER — AZTREONAM 1 G IJ SOLR
1.0000 g | Freq: Three times a day (TID) | INTRAMUSCULAR | Status: DC
  Administered 2017-11-30 – 2017-12-01 (×5): 1 g via INTRAVENOUS
  Filled 2017-11-29 (×6): qty 1

## 2017-11-29 MED ORDER — SODIUM CHLORIDE 0.9 % IV SOLN
1000.0000 mL | INTRAVENOUS | Status: DC
  Administered 2017-11-29: 1000 mL via INTRAVENOUS

## 2017-11-29 MED ORDER — LORATADINE 10 MG PO TABS
10.0000 mg | ORAL_TABLET | Freq: Every morning | ORAL | Status: DC
  Administered 2017-11-29 – 2017-12-01 (×3): 10 mg via ORAL
  Filled 2017-11-29 (×3): qty 1

## 2017-11-29 MED ORDER — SODIUM CHLORIDE 0.9 % IV SOLN
1.0000 g | Freq: Two times a day (BID) | INTRAVENOUS | Status: DC
  Filled 2017-11-29 (×2): qty 1

## 2017-11-29 MED ORDER — BIOTIN 5 MG PO TABS: 5.0000 mg | ORAL_TABLET | Freq: Every morning | ORAL | Status: DC

## 2017-11-29 MED ORDER — PRENATAL PLUS 27-1 MG PO TABS
1.0000 | ORAL_TABLET | Freq: Every day | ORAL | Status: DC
  Administered 2017-11-29 – 2017-11-30 (×2): 1 via ORAL
  Filled 2017-11-29 (×4): qty 1

## 2017-11-29 MED ORDER — INSULIN ASPART 100 UNIT/ML ~~LOC~~ SOLN: 0.0000 [IU] | Freq: Three times a day (TID) | SUBCUTANEOUS | Status: DC

## 2017-11-29 MED ORDER — IOPAMIDOL (ISOVUE-300) INJECTION 61%
INTRAVENOUS | Status: AC
  Administered 2017-11-29: 15 mL
  Filled 2017-11-29: qty 50

## 2017-11-29 MED ORDER — INSULIN ASPART 100 UNIT/ML ~~LOC~~ SOLN
0.0000 [IU] | Freq: Three times a day (TID) | SUBCUTANEOUS | Status: DC
  Administered 2017-11-30: 1 [IU] via SUBCUTANEOUS

## 2017-11-29 MED ORDER — LACTATED RINGERS IV SOLN
INTRAVENOUS | Status: AC
  Administered 2017-11-29: 09:00:00 via INTRAVENOUS

## 2017-11-29 MED ORDER — INSULIN ASPART 100 UNIT/ML ~~LOC~~ SOLN: 0.0000 [IU] | Freq: Every day | SUBCUTANEOUS | Status: DC

## 2017-11-29 MED ORDER — IOHEXOL 300 MG/ML  SOLN
80.0000 mL | Freq: Once | INTRAMUSCULAR | Status: AC | PRN
  Administered 2017-11-29: 80 mL via INTRAVENOUS

## 2017-11-29 MED ORDER — DIPHENHYDRAMINE HCL 25 MG PO CAPS: 25.0000 mg | ORAL_CAPSULE | Freq: Three times a day (TID) | ORAL | Status: DC | PRN

## 2017-11-29 MED ORDER — SODIUM CHLORIDE 0.9% FLUSH: 10.0000 mL | Freq: Two times a day (BID) | INTRAVENOUS | Status: DC

## 2017-11-29 MED ORDER — LEVOFLOXACIN IN D5W 750 MG/150ML IV SOLN
750.0000 mg | Freq: Once | INTRAVENOUS | Status: AC
  Administered 2017-11-29: 750 mg via INTRAVENOUS
  Filled 2017-11-29: qty 150

## 2017-11-29 MED ORDER — LACTATED RINGERS IV SOLN
INTRAVENOUS | Status: DC
  Administered 2017-11-29 (×2): via INTRAVENOUS
  Administered 2017-11-30: 1000 mL via INTRAVENOUS

## 2017-11-29 MED ORDER — COLLAGENASE 250 UNIT/GM EX OINT
1.0000 "application " | TOPICAL_OINTMENT | Freq: Every day | CUTANEOUS | Status: DC
  Filled 2017-11-29: qty 30

## 2017-11-29 MED ORDER — FAMOTIDINE 20 MG PO TABS
20.0000 mg | ORAL_TABLET | Freq: Two times a day (BID) | ORAL | Status: DC | PRN
  Administered 2017-11-30: 20 mg via ORAL
  Filled 2017-11-29: qty 1

## 2017-11-29 MED ORDER — FERROUS SULFATE 325 (65 FE) MG PO TABS
325.0000 mg | ORAL_TABLET | Freq: Every day | ORAL | Status: DC
  Administered 2017-11-29 – 2017-11-30 (×2): 325 mg via ORAL
  Filled 2017-11-29 (×3): qty 1

## 2017-11-29 MED ORDER — SODIUM CHLORIDE 0.9 % IV BOLUS (SEPSIS)
1000.0000 mL | Freq: Once | INTRAVENOUS | Status: AC
  Administered 2017-11-29: 1000 mL via INTRAVENOUS

## 2017-11-29 MED ORDER — DIPHENOXYLATE-ATROPINE 2.5-0.025 MG PO TABS: 1.0000 | ORAL_TABLET | Freq: Four times a day (QID) | ORAL | Status: DC | PRN

## 2017-11-29 MED ORDER — LEVOTHYROXINE SODIUM 75 MCG PO TABS
150.0000 ug | ORAL_TABLET | Freq: Every day | ORAL | Status: DC
  Administered 2017-11-29 – 2017-12-01 (×3): 150 ug via ORAL
  Filled 2017-11-29: qty 2
  Filled 2017-11-29: qty 1
  Filled 2017-11-29: qty 2

## 2017-11-29 MED ORDER — ONDANSETRON 4 MG PO TBDP: 4.0000 mg | ORAL_TABLET | Freq: Four times a day (QID) | ORAL | Status: DC | PRN

## 2017-11-29 MED ORDER — VITAMIN D 1000 UNITS PO TABS: 10000.0000 [IU] | ORAL_TABLET | ORAL | Status: DC

## 2017-11-29 MED ORDER — FENTANYL CITRATE (PF) 100 MCG/2ML IJ SOLN
25.0000 ug | Freq: Once | INTRAMUSCULAR | Status: AC
Start: 2017-11-29 — End: 2017-11-29
  Administered 2017-11-29: 25 ug via INTRAVENOUS
  Filled 2017-11-29: qty 2

## 2017-11-29 MED ORDER — OMEGA-3-ACID ETHYL ESTERS 1 G PO CAPS
1.0000 g | ORAL_CAPSULE | Freq: Two times a day (BID) | ORAL | Status: DC
  Administered 2017-11-29 – 2017-12-01 (×5): 1 g via ORAL
  Filled 2017-11-29 (×6): qty 1

## 2017-11-29 MED ORDER — VANCOMYCIN HCL IN DEXTROSE 1-5 GM/200ML-% IV SOLN: 1000.0000 mg | Freq: Once | INTRAVENOUS | Status: DC

## 2017-11-29 MED ORDER — ANASTROZOLE 1 MG PO TABS
1.0000 mg | ORAL_TABLET | Freq: Every day | ORAL | Status: DC
  Administered 2017-11-29 – 2017-12-01 (×3): 1 mg via ORAL
  Filled 2017-11-29 (×3): qty 1

## 2017-11-29 MED ORDER — SODIUM CHLORIDE 0.9 % IV SOLN
2.0000 g | Freq: Once | INTRAVENOUS | Status: AC
  Administered 2017-11-29: 2 g via INTRAVENOUS
  Filled 2017-11-29: qty 2

## 2017-11-29 MED ORDER — PREMIER PROTEIN SHAKE: 11.0000 [oz_av] | Freq: Two times a day (BID) | ORAL | Status: DC | PRN

## 2017-11-29 MED ORDER — ACETAMINOPHEN 500 MG PO TABS
1000.0000 mg | ORAL_TABLET | Freq: Once | ORAL | Status: AC
  Administered 2017-11-29: 1000 mg via ORAL
  Filled 2017-11-29: qty 2

## 2017-11-29 MED ORDER — LOPERAMIDE HCL 2 MG PO CAPS: 2.0000 mg | ORAL_CAPSULE | Freq: Three times a day (TID) | ORAL | Status: DC | PRN

## 2017-11-29 MED ORDER — METHOCARBAMOL 500 MG PO TABS: 750.0000 mg | ORAL_TABLET | Freq: Three times a day (TID) | ORAL | Status: DC | PRN

## 2017-11-29 MED ORDER — FENTANYL 50 MCG/HR TD PT72
50.0000 ug | MEDICATED_PATCH | TRANSDERMAL | Status: DC
  Administered 2017-12-01: 50 ug via TRANSDERMAL
  Filled 2017-11-29: qty 1

## 2017-11-29 MED ORDER — VANCOMYCIN HCL 10 G IV SOLR
1750.0000 mg | Freq: Once | INTRAVENOUS | Status: AC
  Administered 2017-11-29: 1750 mg via INTRAVENOUS
  Filled 2017-11-29: qty 1750

## 2017-11-29 MED ORDER — SODIUM CHLORIDE 0.9% FLUSH
10.0000 mL | INTRAVENOUS | Status: DC | PRN
  Administered 2017-12-01: 10 mL
  Filled 2017-11-29: qty 40

## 2017-11-29 MED ORDER — FAMOTIDINE 20 MG PO TABS: 20.0000 mg | ORAL_TABLET | Freq: Every day | ORAL | Status: DC | PRN

## 2017-11-29 MED ORDER — SODIUM CHLORIDE 0.9 % IV BOLUS: 1000.0000 mL | INTRAVENOUS | Status: DC | PRN

## 2017-11-29 MED ORDER — INSULIN GLARGINE 100 UNIT/ML ~~LOC~~ SOLN
10.0000 [IU] | Freq: Two times a day (BID) | SUBCUTANEOUS | Status: DC
  Administered 2017-11-29 – 2017-12-01 (×5): 10 [IU] via SUBCUTANEOUS
  Filled 2017-11-29 (×6): qty 0.1

## 2017-11-29 MED ORDER — PSYLLIUM 95 % PO PACK
1.0000 | PACK | Freq: Two times a day (BID) | ORAL | Status: DC
  Administered 2017-11-29 – 2017-12-01 (×3): 1 via ORAL
  Filled 2017-11-29 (×5): qty 1

## 2017-11-29 MED ORDER — FENTANYL CITRATE (PF) 100 MCG/2ML IJ SOLN
25.0000 ug | Freq: Once | INTRAMUSCULAR | Status: AC
  Administered 2017-11-29: 25 ug via INTRAVENOUS
  Filled 2017-11-29: qty 2

## 2017-11-29 MED ORDER — PREGABALIN 50 MG PO CAPS
50.0000 mg | ORAL_CAPSULE | Freq: Two times a day (BID) | ORAL | Status: DC
  Administered 2017-11-29 – 2017-12-01 (×5): 50 mg via ORAL
  Filled 2017-11-29 (×3): qty 1
  Filled 2017-11-29: qty 2
  Filled 2017-11-29: qty 1

## 2017-11-29 MED ORDER — ACETAMINOPHEN 325 MG PO TABS
650.0000 mg | ORAL_TABLET | Freq: Four times a day (QID) | ORAL | Status: DC | PRN
  Administered 2017-11-29 – 2017-12-01 (×3): 650 mg via ORAL
  Filled 2017-11-29 (×3): qty 2

## 2017-11-29 MED ORDER — SENNOSIDES-DOCUSATE SODIUM 8.6-50 MG PO TABS
1.0000 | ORAL_TABLET | Freq: Every evening | ORAL | Status: DC | PRN
  Administered 2017-11-29: 1 via ORAL
  Filled 2017-11-29: qty 1

## 2017-11-29 MED ORDER — POLYETHYL GLYCOL-PROPYL GLYCOL 0.4-0.3 % OP GEL
Freq: Every day | OPHTHALMIC | Status: DC
  Filled 2017-11-29: qty 10

## 2017-11-29 MED ORDER — POLYVINYL ALCOHOL 1.4 % OP SOLN
1.0000 [drp] | Freq: Every day | OPHTHALMIC | Status: DC
  Filled 2017-11-29: qty 15

## 2017-11-29 MED ORDER — VANCOMYCIN HCL 10 G IV SOLR
1500.0000 mg | INTRAVENOUS | Status: DC
  Administered 2017-11-30: 1500 mg via INTRAVENOUS
  Filled 2017-11-29: qty 1500

## 2017-11-29 NOTE — Progress Notes (Addendum)
[name], is a 62 y.o. female, DOB - 01-16-1956, KNL:[id number]     Patient seen in the ER admitted few hours ago by 1 of my partners for sepsis which happened soon after her bilateral nephrostomy tube repositioning/replacement by IR on 11/28/2017.  [name] is a 62 y.o. female with medical history significant of endometrial cancer (s/p of chemotherapy and XRT with TAH and BSO), left-sided breast cancer (s/p of bilateral mastectomies), vesicular rectal fistula, partial resection of bladder, pelvic abscess, s/p of ileostomy, chronic Foley placement, bilateral nephrostomies, PICC line placed in her right upper extremity, diabetes mellitus, hyperlipidemia, GERD, hypothyroidism, CKD 4 baseline creatinine of close to 1.4, iron deficiency anemia, neutropenia, who presents with fever chills.  Pt states that she had bilateral nephrotomies placed in 09/2017, which were exchanged by interventional radiology on 11/28/2017. She developed fever of 103.2 and chills today.  She also has bilateral flank pain and suprapubic pressure.  She noticed blood in the bags.  She has nausea, no vomiting or diarrhea.    No other subjective complaints in the ER she was diagnosed with sepsis likely source pyelonephritis which was suggested on the CT scan done in the ER.  She has been placed on IV vancomycin and aztreonam, she is being resuscitated with IV fluids, now symptom-free looks nontoxic.  Blood pressure slightly soft but she informs me that her blood pressure chronically runs systolic around 790.  She is awaiting a bed and stepdown unit, currently appears likely stable will be moved once bed available.  Continue present treatment.  I have called and informed IR about patient's possible nephrostomy tube related complications they will see the patient, Dr. Alvy Bimler her oncologist has also been added to treatment team.   Her urologist is  Dr. Tresa Moore.Nephrologist is Dr.Upton. Oncologist is Dr. Denman [name] and Alvy Bimler.      Vitals:   11/29/17 0830 11/29/17 0845 11/29/17 0900 11/29/17 0915  BP: (!) 90/56 (!) 84/51 (!) 93/58 93/70  Pulse: 72 72 70 81  Resp: 13 14 (!) 26 12  Temp:      TempSrc:      SpO2: 98% 98% 99% 99%  Weight:      Height:         Awake Alert, Oriented X 3, No new F.N deficits, Normal affect [place].AT,PERRAL Supple Neck,No JVD, No cervical lymphadenopathy appriciated.  Symmetrical Chest wall movement, Good air movement bilaterally, CTAB RRR,No Gallops, Rubs or new Murmurs, No Parasternal Heave +ve B.Sounds, Abd Soft, No tenderness, No organomegaly appriciated, No rebound - guarding or rigidity. Ileostomy bag with liquid stool, bilat Neph tubes in place, mild flank tenderness L>R No Cyanosis, Clubbing or edema, No new Rash or bruise    Data Review   Micro Results No results found for this or any previous visit (from the past 240 hour(s)).  Radiology Reports Ct Abdomen Pelvis W Contrast  Result Date: 11/29/2017 CLINICAL DATA:  Bladder pressure, fever and chills. Blood in the [place] me back after tube placement. 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 EXAM: CT ABDOMEN AND PELVIS WITH CONTRAST TECHNIQUE: Multidetector CT imaging of the abdomen and pelvis was performed using the standard protocol following bolus administration of intravenous contrast. CONTRAST:  52mL OMNIPAQUE IOHEXOL 300 MG/ML  SOLN COMPARISON:  10/02/2017 FINDINGS: Lower chest: Bilateral breast implants.  Lung bases are clear. Hepatobiliary: No focal liver abnormality is seen. Status post cholecystectomy. No biliary dilatation. Pancreas: Unremarkable. No pancreatic ductal dilatation or surrounding inflammatory changes. Spleen:  Normal in size without focal abnormality. Adrenals/Urinary Tract: Left adrenal gland nodule measuring 1.7 cm. Indeterminate. No change since prior study. Bilateral percutaneous nephrostomy  tubes. Pigtails reside in the renal collecting systems bilaterally. No hydronephrosis. Gas in the renal collecting systems is likely related to catheter insertion. Hazy infiltration in the renal hila bilaterally may represent edema, inflammatory change, or hematoma. Bladder is decompressed with a Foley catheter. Stomach/Bowel: Partial colectomy with right upper quadrant colostomy. Stomach, small bowel, and colon are not abnormally distended. A loop of bowel in the pelvis, probably small-bowel, demonstrates thickened wall suggesting enteritis or bowel wall edema. If there has been a history of pelvic radiation therapy this could be postradiation change. Vascular/Lymphatic: Normal caliber abdominal aorta. Scattered lymph nodes in the retroperitoneum are prominent but not pathologically enlarged. No change since prior study. Reproductive: Surgical absence of the uterus. No abnormal pelvic masses. Other: No free air in the abdomen. Small amount of free fluid in the pelvis is similar to prior study. There is prominent residual presacral soft tissue with surgical clips present. This is similar to prior study and may represent postoperative scarring. Follow up on subsequent imaging is recommended to exclude development of residual or recurrent tumor. Musculoskeletal: No destructive bone lesions. IMPRESSION: 1. Bilateral percutaneous nephrostomy tubes with decompression of the renal collecting systems. Hazy infiltration in the renal hila bilaterally could represent edema, inflammatory change, or hematoma. 2. Pelvic small bowel demonstrates thickened wall suggesting enteritis or edema. Possible radiation change. 3. Small amount of free fluid in the pelvis is nonspecific but likely reactive. No change since prior study. 4. Prominent presacral soft tissues and area of postoperative change. This could represent postoperative scarring. Attention on follow-up imaging to exclude residual or recurrent tumor. No change since prior  study. 5. 1.7 cm left adrenal gland nodule is unchanged since prior study. Electronically Signed   By: Lucienne Capers M.D.   On: 11/29/2017 04:27   Dg Chest Port 1 View  Result Date: 11/29/2017 CLINICAL DATA:  Fever EXAM: PORTABLE CHEST 1 VIEW COMPARISON:  10/03/2017 FINDINGS: The heart size and mediastinal contours are within normal limits. Both lungs are clear. The visualized skeletal structures are unremarkable. Right PICC line with tip over the mid SVC region. No pneumothorax. Surgical clips in the left axilla. Probable drainage catheters projected over the upper abdomen, incompletely visualized. IMPRESSION: No active disease. Electronically Signed   By: Lucienne Capers M.D.   On: 11/29/2017 03:11   Ir Nephrostogram Left Thru Existing Access  Result Date: 11/29/2017 INDICATION: Sepsis. Patient had bilateral nephrostomy catheter exchange yesterday without apparent complication. EXAM: BILATERAL ANTEGRADE NEPHROSTOGRAM THROUGH EXISTING CATHETER COMPARISON:  Previous day's exam MEDICATIONS: None indicated ANESTHESIA/SEDATION: None required CONTRAST:  15 mL Isovue-administered into the collecting system(s) FLUOROSCOPY TIME:  36 seconds; 5 mGy COMPLICATIONS: None immediate. PROCEDURE: Informed written consent was obtained from the patient after a thorough discussion of the procedural risks, benefits and alternatives. All questions were addressed. Contrast was sequentially injected under fluoroscopy through the right and left nephrostomy catheters. Both catheters remain well positioned centrally within the renal collecting systems. No hydronephrosis. No filling defects. Proximal ureters decompressed. IMPRESSION: 1. Bilateral nephrostomy catheters are well position and patent. Electronically Signed   By: Lucrezia Europe M.D.   On: 11/29/2017 09:46   Ir Nephrostogram Right Thru Existing Access  Result Date: 11/29/2017 INDICATION: Sepsis. Patient had bilateral nephrostomy catheter exchange yesterday without  apparent complication. EXAM: BILATERAL ANTEGRADE NEPHROSTOGRAM THROUGH EXISTING CATHETER COMPARISON:  Previous day's exam MEDICATIONS: None  indicated ANESTHESIA/SEDATION: None required CONTRAST:  15 mL Isovue-administered into the collecting system(s) FLUOROSCOPY TIME:  36 seconds; 5 mGy COMPLICATIONS: None immediate. PROCEDURE: Informed written consent was obtained from the patient after a thorough discussion of the procedural risks, benefits and alternatives. All questions were addressed. Contrast was sequentially injected under fluoroscopy through the right and left nephrostomy catheters. Both catheters remain well positioned centrally within the renal collecting systems. No hydronephrosis. No filling defects. Proximal ureters decompressed. IMPRESSION: 1. Bilateral nephrostomy catheters are well position and patent. Electronically Signed   By: Lucrezia Europe M.D.   On: 11/29/2017 09:46   Ir Nephrostomy Exchange Left  Result Date: 11/28/2017 INDICATION: 62 year old female with a history of endometrial cancer complicated by bilateral ureteral obstruction. Bilateral percutaneous nephrostomy tubes were placed on 09/02/2017. She presents today for routine exchange. Of note, she has had significant difficulties with her right-sided percutaneous nephrostomy tube which has been painful since the time of placement. EXAM: Bilateral nephrostomy tube exchange COMPARISON:  None. MEDICATIONS: None ANESTHESIA/SEDATION: None CONTRAST:  10 mL Omnipaque 300-administered into the collecting system(s) FLUOROSCOPY TIME:  Fluoroscopy Time: 3 minutes 12 seconds (58.6 mGy). COMPLICATIONS: None immediate. PROCEDURE: Informed written consent was obtained from the patient after a thorough discussion of the procedural risks, benefits and alternatives. All questions were addressed. Maximal Sterile Barrier Technique was utilized including caps, mask, sterile gowns, sterile gloves, sterile drape, hand hygiene and skin antiseptic. A timeout was  performed prior to the initiation of the procedure. Gentle hand injection of contrast material through the left percutaneous nephrostomy tube demonstrates the tube enters via an interpolar calyx and is well formed in the renal pelvis. The renal pelvis is relatively small with respect to the tube. The tube was cut and removed over a short Amplatz wire. A new Cook 12 French percutaneous nephrostomy tube was advanced over the wire and formed. The tube was secured to the skin with 0 Prolene suture and an adhesive fixation device. Images were obtained and stored for the medical record. Attention was turned to the left tube. A gentle hand injection of contrast material was again performed. The tube enters via and upper pole calyx. The tube is large relative to the size of the collecting system. The tube was cut and removed over a short Amplatz wire. An attempt was made to replace with another 12 percutaneous nephrostomy tube, however the tube was difficult to form in the relatively small bifid renal pelvis. The patient expressed discomfort. Therefore, the tube was discarded and a 12 French Bettey Mare drainage catheter was advanced over the wire and formed in the renal pelvis. This catheter was then secured to the skin with 0 Prolene suture and an adhesive fixation device. The patient tolerated the procedure relatively well. IMPRESSION: 1. Exchange for a left 12 French percutaneous nephrostomy tube. 2. Exchange for a right 12 French Bettey Mare nephrostomy tube due to small renal pelvis size and patient discomfort. Signed, Criselda Peaches, MD [facility] Radiology Electronically Signed   By: Jacqulynn Cadet M.D.   On: 11/28/2017 15:56   Ir Nephrostomy Exchange Right  Result Date: 11/28/2017 INDICATION: 62 year old female with a history of endometrial cancer complicated by bilateral ureteral obstruction. Bilateral percutaneous nephrostomy tubes were placed on  09/02/2017. She presents today for routine exchange. Of note, she has had significant difficulties with her right-sided percutaneous nephrostomy tube which has been painful since the time of placement. EXAM: Bilateral nephrostomy tube exchange COMPARISON:  None. MEDICATIONS: None ANESTHESIA/SEDATION:  None CONTRAST:  10 mL Omnipaque 300-administered into the collecting system(s) FLUOROSCOPY TIME:  Fluoroscopy Time: 3 minutes 12 seconds (58.6 mGy). COMPLICATIONS: None immediate. PROCEDURE: Informed written consent was obtained from the patient after a thorough discussion of the procedural risks, benefits and alternatives. All questions were addressed. Maximal Sterile Barrier Technique was utilized including caps, mask, sterile gowns, sterile gloves, sterile drape, hand hygiene and skin antiseptic. A timeout was performed prior to the initiation of the procedure. Gentle hand injection of contrast material through the left percutaneous nephrostomy tube demonstrates the tube enters via an interpolar calyx and is well formed in the renal pelvis. The renal pelvis is relatively small with respect to the tube. The tube was cut and removed over a short Amplatz wire. A new Cook 12 French percutaneous nephrostomy tube was advanced over the wire and formed. The tube was secured to the skin with 0 Prolene suture and an adhesive fixation device. Images were obtained and stored for the medical record. Attention was turned to the left tube. A gentle hand injection of contrast material was again performed. The tube enters via and upper pole calyx. The tube is large relative to the size of the collecting system. The tube was cut and removed over a short Amplatz wire. An attempt was made to replace with another 12 percutaneous nephrostomy tube, however the tube was difficult to form in the relatively small bifid renal pelvis. The patient expressed discomfort. Therefore, the tube was discarded and a 12 French Bettey Mare drainage  catheter was advanced over the wire and formed in the renal pelvis. This catheter was then secured to the skin with 0 Prolene suture and an adhesive fixation device. The patient tolerated the procedure relatively well. IMPRESSION: 1. Exchange for a left 12 French percutaneous nephrostomy tube. 2. Exchange for a right 12 French Bettey Mare nephrostomy tube due to small renal pelvis size and patient discomfort. Signed, Criselda Peaches, MD [facility] Radiology Electronically Signed   By: Jacqulynn Cadet M.D.   On: 11/28/2017 15:56    CBC Recent Labs  Lab 11/29/17 0156  WBC 3.6*  HGB 8.7*  HCT 28.4*  PLT 314  MCV 96.3  MCH 29.5  MCHC 30.6  RDW 17.7*  LYMPHSABS 0.6*  MONOABS 0.7  EOSABS 0.2  BASOSABS 0.0    Chemistries  Recent Labs  Lab 11/29/17 0156  NA 138  K 3.7  CL 108  CO2 22  GLUCOSE 165*  BUN 21*  CREATININE 1.48*  CALCIUM 8.5*  AST 16  ALT 25  ALKPHOS 193*  BILITOT 0.3   ------------------------------------------------------------------------------------------------------------------ estimated creatinine clearance is 41.2 mL/min (A) (by C-G formula based on SCr of 1.48 mg/dL (H)). ------------------------------------------------------------------------------------------------------------------ No results for input(s): HGBA1C in the last 72 hours. ------------------------------------------------------------------------------------------------------------------ No results for input(s): CHOL, HDL, LDLCALC, TRIG, CHOLHDL, LDLDIRECT in the last 72 hours. ------------------------------------------------------------------------------------------------------------------ No results for input(s): TSH, T4TOTAL, T3FREE, THYROIDAB in the last 72 hours.  Invalid input(s): FREET3 ------------------------------------------------------------------------------------------------------------------ No results for input(s):  VITAMINB12, FOLATE, FERRITIN, TIBC, IRON, RETICCTPCT in the last 72 hours.  Coagulation profile Recent Labs  Lab 11/29/17 0156  INR 1.02    No results for input(s): DDIMER in the last 72 hours.  Cardiac Enzymes No results for input(s): CKMB, TROPONINI, MYOGLOBIN in the last 168 hours.  Invalid input(s): CK ------------------------------------------------------------------------------------------------------------------ Invalid input(s): POCBNP   Signature  Lala Lund M.D on 11/29/2017 at 10:45 AM  Between 7am to 7pm - Pager - [phone] ( page  via [email or web address], text pages only, please mention full 10 digit call back number).  After 7pm go to [email or web address] - password [facility]

## 2017-11-29 NOTE — Progress Notes (Signed)
Pharmacy Antibiotic Note  [name] is a 62 y.o. female admitted on 11/29/2017 with pyelonephritis w/ h/o MRSA UTI.  Pharmacy has been consulted for vancomycin and aztreonam dosing.  Plan: Vancomycin 1750mg  x1 then 1500mg  IV every 24 hours.  Goal trough 10-15 mcg/mL.  Aztreonam 2g x1 then 1g IV every 12 hours.  Height: 5' 2.5" (158.8 cm) Weight: 200 lb (90.7 kg) IBW/kg (Calculated) : 51.25  Temp (24hrs), Avg:99.6 F (37.6 C), Min:98.7 F (37.1 C), Max:100.7 F (38.2 C)  Recent Labs  Lab 11/29/17 0156 11/29/17 0212  WBC 3.6*  --   CREATININE 1.48*  --   LATICACIDVEN  --  1.16    Estimated Creatinine Clearance: 41.2 mL/min (A) (by C-G formula based on SCr of 1.48 mg/dL (H)).    Allergies  Allergen Reactions  . Penicillins Swelling    Facial swelling/childhood allergy Has patient had a PCN reaction causing immediate rash, facial/tongue/throat swelling, SOB or lightheadedness with hypotension: Yes Has patient had a PCN reaction causing severe rash involving mucus membranes or skin necrosis: Yes Has patient had a PCN reaction that required hospitalization yes Has patient had a PCN reaction occurring within the last 10 years: No If all of the above answers are "NO", then may proceed with Cephalosporin use.   . Adhesive [Tape]     blisters  . Cefaclor Rash    Ceclor  . Erythromycin Other (See Comments)    Gastritis, abd cramps  . Trimethoprim Rash  . Ultram [Tramadol] Hives  . Fluconazole Rash  . Oxycodone Other (See Comments)    " I just feel weird"  . Pectin Rash    Pectin ring for stoma  . Septra [Sulfamethoxazole-Trimethoprim] Rash  . Sulfa Antibiotics Rash     Thank you for allowing pharmacy to be a part of this patient's care.  Wynona Neat, PharmD, BCPS  11/29/2017 5:53 AM

## 2017-11-29 NOTE — ED Provider Notes (Signed)
TIME SEEN: 2:15 AM  CHIEF COMPLAINT: Fever  HPI: Patient is a 62 year old female with history of recurrent endometrial cancer that previously underwent chemotherapy and XRT with TAH and BSO, left-sided breast cancer status post bilateral mastectomies who presents to the emergency department for fever.  Patient has a complex medical history.  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  She has a Foley catheter in place and had bilateral nephrostomies placed on February 1.  States they were exchanged by interventional radiology yesterday.  Is also had a PICC line placed in her right upper extremity on 10/03/2017 which she receives IV fluids irregularly.  She is also had a history of a pelvic abscess that required a pigtail catheter from 09/15/2017 to 09/20/2017.  Her urologist is Dr. Tresa Moore.  Nephrologist is Dr. Hollie Salk.  Oncologist is Dr. Denman [name] and Alvy Bimler.  She is not currently on chemotherapy or radiation.  She states that she does get regular Neupogen injections.  States she came to the emergency department today because she had fever at home of 103.2.  No headache, neck pain or neck stiffness, chest pain or shortness of breath, cough.  Complaining of bilateral flank pain from her nephrostomies.  Reports good output from her nephrostomy and Foley catheter.  She has history of high output ileostomy and that is why she receives IV fluids regularly.  Denies nausea, vomiting or diarrhea.  Has previously had sepsis.  ROS: See HPI Constitutional:  fever  Eyes: no drainage  ENT: no runny nose   Cardiovascular:  no chest pain  Resp: no SOB  GI: no vomiting GU: no dysuria Integumentary: no rash  Allergy: no hives  Musculoskeletal: no leg swelling  Neurological: no slurred speech ROS otherwise negative  PAST MEDICAL HISTORY/PAST SURGICAL HISTORY:  Past Medical History:  Diagnosis Date  . Adrenal adenoma, left 02/08/2016   CT: stable benign  . Anemia in neoplastic disease   . Benign  essential HTN   . 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 Cancer of corpus uteri, except isthmus [facility])  oncologist-- dr Denman [name] and dr Alvy Bimler    10-15-2004  dx endometroid endometrial and ovarian cancer s/p  chemotheapy and surgery(TAH w/ BSO) :  recurrent 11-19-2014 post pelvic surgery and radiation 01-29-2015 to 03-10-2015  . Chronic idiopathic neutropenia (HCC)    presumed related to chemotherapy March 2006--- followed by dr Alvy Bimler (treatment w/ G-CSF injections  . Chronic nausea   . Chronic pain    perineal/ anal  area from bladder pad irritates skin , right flank pain  . CKD stage G2/A3, GFR 60-89 and albumin creatinine ratio >300 mg/g    nephrologist-  dr Madelon Lips  . Diabetic retinopathy, background ([place])   . Difficult intravenous access    small veins--- hx PICC lines  . DM type 2 (diabetes mellitus, type 2) ([place])    monitored by dr Legrand Como altheimer  . Dysuria   . Environmental and seasonal allergies   . Fatty liver 02/08/2016   CT  . Generalized muscle weakness   . GERD (gastroesophageal reflux disease)   . Hiatal hernia   . History of abdominal abscess 04/16/2017   post surgery 04-01-2017  --- resolved 10/ 2018  . History of gastric polyp    2014  duodenum  . History of  ileus 04/16/2017   resolved w/ no surgical intervention  . History of radiation therapy    01-29-2015 to 03-10-2015  pelvis 50.4Gy  . Hypothyroidism    monitored by dr Legrand Como altheimer  . Ileostomy in place [facility]) 04/01/2017   created at same time colostomy takedown.  . Lower urinary tract symptoms (LUTS)    urge urinary  incontinence  . Mixed dyslipidemia   . Multiple thyroid nodules    Managed by Dr. Harlow Asa  . Pelvic abscess in female 04/16/2017  . PONV (postoperative nausea and vomiting)    "scopolamine  patch works for me"  . Radiation-induced dermatitis    contact dermatitis , radiation completed, rash only on ankles now.  . Seasonal allergies   . Ureteral stricture, right UROLOGIT-  DR [facility]   CHRONIC--  TREATMENT URETERAL STENT  . 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 Vitamin D deficiency   . Wears glasses     MEDICATIONS:  Prior to Admission medications   Medication Sig Start Date End Date Taking? Authorizing Provider  acetaminophen (TYLENOL) 325 MG tablet Take 2 tablets (650 mg total) by mouth every 6 (six) hours as needed. 09/24/17   Roxan Hockey, MD  anastrozole (ARIMIDEX) 1 MG tablet Take 1 tablet (1 mg total) by mouth daily. 09/24/17   Roxan Hockey, MD  Biotin 5 MG TABS Take 5 mg by mouth every morning.     [provider]  Cholecalciferol (VITAMIN D3) 10000 UNITS capsule Take 10,000 Units by mouth once a week. Sunday evening's    [provider]  collagenase (SANTYL) ointment Apply 1 application topically daily.    [provider]  diphenhydrAMINE (BENADRYL) 25 mg capsule Take 1 capsule (25 mg total) by mouth every 8 (eight) hours as needed for itching, allergies or sleep. 09/24/17   Roxan Hockey, MD  diphenoxylate-atropine (LOMOTIL) 2.5-0.025 MG tablet Take 1 tablet by mouth 4 (four) times daily as needed for diarrhea or loose stools. TO PREVENT LOOSE BOWEL MOVEMENTS 09/24/17   Roxan Hockey, MD  famotidine (PEPCID) 20 MG tablet Take 20 mg by mouth daily as needed for heartburn or indigestion.    [provider]  fentaNYL (DURAGESIC) 50 MCG/HR Place 1 patch (50 mcg total) onto the skin every 3 (three) days. 11/22/17 12/22/17  Ann Held, DO  ferrous sulfate 325 (65 FE) MG tablet Take 1 tablet (325 mg total) by mouth at bedtime. 09/24/17   Roxan Hockey, MD  filgrastim (NEUPOGEN) 480 MCG/1.6ML injection Inject 480 mcg/1.6 ml under the skin every 6 days for life (last dose 09/22/17) 09/24/17   Emokpae, Courage, MD   insulin aspart (NOVOLOG) 100 UNIT/ML injection Inject 0-20 Units into the skin 3 (three) times daily with meals. Patient taking differently: Inject 2-12 Units into the skin See admin instructions. Per sliding scale; if BS <60, call MD. If BS 201-250=2 units, 251-300=4 units, 301-350=6 units, 351-400=8 units, 401-450=10 units, BS>450=12 units 09/24/17   Emokpae, Courage, MD  insulin aspart (NOVOLOG) 100 UNIT/ML injection Inject 7 Units into the skin 3 (three) times daily with meals. 09/24/17   Roxan Hockey, MD  levothyroxine (SYNTHROID, LEVOTHROID) 150 MCG tablet Take 1 tablet (150 mcg total) by mouth daily before breakfast. 09/25/17   Denton Brick, Courage, MD  lip balm (CARMEX) ointment Apply 1 application topically 2 (two) times daily. 09/24/17   Roxan Hockey, MD  loperamide (IMODIUM) 2 MG capsule Take 1-2 capsules (2-4 mg total) by mouth every 8 (eight) hours as needed for diarrhea or  loose stools (Use if >2 BM every 8 hours). 09/24/17   Roxan Hockey, MD  loratadine (CLARITIN) 10 MG tablet Take 10 mg by mouth every morning.     [provider]  methocarbamol (ROBAXIN) 750 MG tablet Take 1 tablet (750 mg total) by mouth 3 (three) times daily. Patient taking differently: Take 750 mg by mouth 3 (three) times daily as needed for muscle spasms.  09/24/17   Roxan Hockey, MD  omega-3 acid ethyl esters (LOVAZA) 1 G capsule Take 1 g by mouth 2 (two) times daily.     [provider]  ondansetron (ZOFRAN-ODT) 4 MG disintegrating tablet Take 4 mg by mouth every 6 (six) hours as needed for nausea or vomiting.  04/12/17   [provider]  Polyethyl Glycol-Propyl Glycol (SYSTANE OP) Place 1 drop into both eyes at bedtime.     [provider]  pregabalin (LYRICA) 50 MG capsule Take 1 capsule (50 mg total) by mouth 2 (two) times daily. 11/08/17   Ann Held, DO  Prenatal Vit-Fe Fumarate-FA (PRENATAL VITAMIN PO) Take 1 capsule by mouth daily. Takes prenatal because  there are no dyes in it    [provider]  prochlorperazine (COMPAZINE) 10 MG tablet Take 1 tablet (10 mg total) by mouth 2 (two) times daily as needed for nausea or vomiting. Patient taking differently: Take 10 mg by mouth 2 (two) times daily as needed for nausea or vomiting.  05/28/17   Virgel Manifold, MD  promethazine (PHENERGAN) 25 MG tablet Take 1 tablet (25 mg total) by mouth every 6 (six) hours as needed for nausea or vomiting. Patient taking differently: Take 25 mg by mouth 4 (four) times daily as needed for nausea or vomiting.  05/28/17   Virgel Manifold, MD  protein supplement shake (PREMIER PROTEIN) LIQD Take 325 mLs (11 oz total) by mouth 2 (two) times daily as needed (Pt to request). 09/24/17   Roxan Hockey, MD  psyllium (HYDROCIL/METAMUCIL) 95 % PACK Take 1 packet by mouth 2 (two) times daily. 09/24/17   Roxan Hockey, MD  ranitidine (ZANTAC) 150 MG tablet Take 1 tablet (150 mg total) by mouth 2 (two) times daily as needed for heartburn. 09/24/17   Roxan Hockey, MD  rosuvastatin (CRESTOR) 10 MG tablet Take 10 mg by mouth every evening.     [provider]  sitaGLIPtin (JANUVIA) 100 MG tablet Take 100 mg by mouth every morning.     [provider]  Tbo-Filgrastim (GRANIX) 480 MCG/0.8ML SOSY injection Inject 0.8 mLs (480 mcg total) into the skin daily. 11/28/17   Heath Lark, MD  tuberculin (TUBERSOL) 5 UNIT/0.1ML injection Inject 5 Units into the skin every 14 (fourteen) days.    [provider]    ALLERGIES:  Allergies  Allergen Reactions  . Penicillins Swelling    Facial swelling/childhood allergy Has patient had a PCN reaction causing immediate rash, facial/tongue/throat swelling, SOB or lightheadedness with hypotension: Yes Has patient had a PCN reaction causing severe rash involving mucus membranes or skin necrosis: Yes Has patient had a PCN reaction that required hospitalization yes Has patient had a PCN reaction occurring within the  last 10 years: No If all of the above answers are "NO", then may proceed with Cephalosporin use.   . Adhesive [Tape]     blisters  . Cefaclor Rash    Ceclor  . Erythromycin Other (See Comments)    Gastritis, abd cramps  . Trimethoprim Rash  . Ultram [Tramadol] Hives  .  Fluconazole Rash  . Oxycodone Other (See Comments)    " I just feel weird"  . Pectin Rash    Pectin ring for stoma  . Septra [Sulfamethoxazole-Trimethoprim] Rash  . Sulfa Antibiotics Rash    SOCIAL HISTORY:  Social History   Tobacco Use  . Smoking status: Never Smoker  . Smokeless tobacco: Never Used  Substance Use Topics  . Alcohol use: Yes    Comment: rare social    FAMILY HISTORY: Family History  Problem Relation Age of Onset  . Cancer Mother 94       stomach ca  . Hypertension Mother   . Cancer Father 63       prostate ca  . Diabetes Father   . Heart disease Father        CABG  . Breast cancer Maternal Aunt        dx in her 79s  . Lymphoma Paternal Aunt   . Brain cancer Paternal Grandfather   . Ovarian cancer Other   . Diabetes Sister   . Hypertension Brother y-10  . Heart disease Brother        CABG  . Diabetes Brother     EXAM: BP (!) 102/50   Pulse 88   Temp (!) 100.7 F (38.2 C) (Rectal)   Resp 14   Ht 5' 2.5" (1.588 m)   Wt 90.7 kg (200 lb)   SpO2 98%   BMI 36.00 kg/m  CONSTITUTIONAL: Alert and oriented and responds appropriately to questions.  Chronically ill-appearing, febrile but nontoxic HEAD: Normocephalic EYES: Conjunctivae clear, pupils appear equal, EOMI ENT: normal nose; moist mucous membranes NECK: Supple, no meningismus, no nuchal rigidity, no LAD  CARD: RRR; S1 and S2 appreciated; no murmurs, no clicks, no rubs, no gallops RESP: Normal chest excursion without splinting or tachypnea; breath sounds clear and equal bilaterally; no wheezes, no rhonchi, no rales, no hypoxia or respiratory distress, speaking full sentences ABD/GI: Normal bowel sounds; non-distended;  soft, non-tender, no rebound, no guarding, no peritoneal signs, no hepatosplenomegaly, ileostomy noted with normal-appearing output BACK:  The back appears normal and is non-tender to palpation, there is no CVA tenderness, nephrostomy tubes noted in bilateral flanks without surrounding redness, warmth, tenderness or drainage EXT: Normal ROM in all joints; non-tender to palpation; no edema; normal capillary refill; no cyanosis, no calf tenderness or swelling    SKIN: Normal color for age and race; warm; no rash NEURO: Moves all extremities equally PSYCH: The patient's mood and manner are appropriate. Grooming and personal hygiene are appropriate.  MEDICAL DECISION MAKING: Patient here with fever.  She does have some slightly soft blood pressures in the 95M systolic.  Concern for possible sepsis.  Will give IV fluids and broad-spectrum antibiotics.  Will obtain labs, cultures, chest x-ray.  Will assess obtain a CT of her abdomen pelvis with contrast given she has had bilateral nephrostomies and history of pelvic abscess in the past.  I feel she will need admission.  Patient is comfortable with this plan.  ED PROGRESS: Patient's urine appears infected.  Urine was obtained from her nephrostomy tube and not her Foley catheter.  She reports history of frequent kidney infections and CT scan shows edema versus inflammatory changes around both kidneys concerning for possible pyelonephritis.  She has received broad-spectrum antibiotics.  Blood pressure is improving with IV hydration.  Will admit to medicine.   5:11 AM Discussed patient's case with hospitalist, Dr. Blaine Hamper.  I have recommended admission and patient (and family if  present) agree with this plan. Admitting physician will place admission orders.   I reviewed all nursing notes, vitals, pertinent previous records, EKGs, lab and urine results, imaging (as available).    EKG Interpretation  Date/Time:  Tuesday November 29 2017 03:26:32 EDT Ventricular  Rate:  84 PR Interval:    QRS Duration: 83 QT Interval:  380 QTC Calculation: 450 R Axis:   104 Text Interpretation:  Sinus rhythm Right axis deviation Baseline wander in lead(s) V2 No significant change since last tracing Confirmed by Saquan Furtick, Cyril Mourning [phone]) on 11/29/2017 3:44:05 AM        CRITICAL CARE Performed by: Cyril Mourning Hemi Chacko   Total critical care time: 40 minutes  Critical care time was exclusive of separately billable procedures and treating other patients.  Critical care was necessary to treat or prevent imminent or life-threatening deterioration.  Critical care was time spent personally by me on the following activities: development of treatment plan with patient and/or surrogate as well as nursing, discussions with consultants, evaluation of patient's response to treatment, examination of patient, obtaining history from patient or surrogate, ordering and performing treatments and interventions, ordering and review of laboratory studies, ordering and review of radiographic studies, pulse oximetry and re-evaluation of patient's condition.     Dylon Correa, Delice Bison, DO 11/29/17 [phone]

## 2017-11-29 NOTE — ED Notes (Signed)
Patient transported to IR 

## 2017-11-29 NOTE — ED Notes (Signed)
Lunch Tray ordered 

## 2017-11-29 NOTE — ED Notes (Signed)
Patient transported to CT 

## 2017-11-29 NOTE — ED Notes (Signed)
Lunch Tray delivered and at bedside.

## 2017-11-29 NOTE — Consult Note (Signed)
[facility] Nurse consult note Patient is well known to our department from previous admissions. Last seen in March of this year.  [place] Nurse wound consult note Reason for Consult: Healing Stage 3 pressure injury to right buttock Wound type:Pressure, moisture, friction Pressure Injury POA: Yes Measurement: 1.5cm x 1cm x 0.2cm Wound OFB:PZWC, moist Drainage (amount, consistency, odor) Small amount serous Periwound: intact with mild erythema Dressing procedure/placement/frequency: Current POC is to cover with xeroform gauze, then top with silicone foam. I will continue that while in house. Orders provided for Nursing.  Patient to turn from side to side and Keep HOB at or below a 30 degree angle. Patient declines a mattress replacement with low air loss feature at this time.  She knows that she can change her mind on this and one will be provided for her.  [address] Nurse ostomy consult note Stoma type/location: RLQ ileostomy Stomal assessment/size: 1 inch, flush, red viewed through pouching window Peristomal assessment: Not seen today Treatment options for stomal/peristomal skin: None today Output: brown effluent Ostomy pouching: 1pc.convex pouching system  Education provided: None today.  Patient and husband report pouches are lasting 3-4 days at home. Pouch is currently intact. Enrolled patient in [place] program: Yes, previously.  Patient is established with ostomy supply company  [place] team will follow, and will remain available to this patient, the nursing and medical teams.   Thanks, Maudie Flakes, MSN, RN, [place], Arther Abbott  Pager# [phone]

## 2017-11-29 NOTE — Progress Notes (Signed)
New Admission Note: 11/29/2017  Arrival Method: Stretcher Mental Orientation: Alertx4 Telemetry: Yes  Assessment: Completed Skin: Intact IV: PICC Pain: 0 Tubes: None Safety Measures: Safety Fall Prevention Plan has been discussed  Admission: 11/29/17  [address] [place] Orientation: Patient has been orientated to the room, unit and staff.   Family: at bedside   Orders to be reviewed and implemented. Will continue to monitor the patient. Call light has been placed within reach and bed alarm has been activated.   Baldo Ash, RN

## 2017-11-29 NOTE — ED Notes (Signed)
Pt ambulatory to the restroom to empty ostomy bag, steady gait

## 2017-11-29 NOTE — Progress Notes (Signed)
[facility]  Patient Status: Active (receiving services up to time of hospitalization)  [facility] is providing the following services: RN  If patient discharges after hours, please call [phone].   [name] 11/29/2017, 5:49 PM

## 2017-11-29 NOTE — ED Notes (Signed)
ORDERED A HEART HEALTHY/CARB MODIFIED BREAKFAST TRAY--[name]

## 2017-11-29 NOTE — Care Management Note (Signed)
Case Management Note  Patient Details  Name: [name] MRN: [id number] Date of Birth: July 14, 1956  Subjective/Objective:                  62 year old female with hx of recurrent endometrial cancer that previously underwent chemotherapy and XRT with TAH and BSO, left-sided breast cancer status post bilateral mastectomies who presents to the emergency department for fever. From home with spouse.  Action/Plan: Admit status INPATIENT (SEPSIS R/T PYELONEPHRITIS); anticipate discharge [place] [facility].   Expected Discharge Date:  (unknown)               Expected Discharge Plan:  [place]  Discharge planning Services  CM Consult  Status of Service:  In process, will continue to follow  If discussed at Long Length of Stay Meetings, dates discussed:    Additional Comments: PCP: Ann Held, DO  Fuller Mandril, RN 11/29/2017, 8:34 AM

## 2017-11-29 NOTE — ED Triage Notes (Signed)
Pt had her nephrostomy tubes replaced today. During the procedure, there were complications regarding the R nephrostomy tube. After getting home, pt started having chills, reported increased fever. Pt took 650mg  tylenol at 2000. Has also noticed some blood in nephrostomy bag. Pt also reports small bedsore on buttock, noticed for the past week. PICC line present to R upper arm (receives 1L saline 3 days a week, mag infusions on Wednesday). Also received 1 unit of blood on Friday for hgb of 6.0. Has also had bladder pressure, concerned for infection

## 2017-11-29 NOTE — H&P (Signed)
History and Physical    [name] WUX:[id number] DOB: [birth year] DOA: 11/29/2017  Referring MD/NP/PA:   PCP: Ann Held, DO   Patient coming from:  The patient is coming from home.  At baseline, pt is independent for most of ADL.  Chief Complaint: Fever, chills,  HPI: AOI KOUNS is a 62 y.o. female with medical history significant of endometrial cancer (s/p of chemotherapy and XRT with TAH and BSO), left-sided breast cancer (s/p of bilateral mastectomies), vesicular rectal fistula, partial resection of bladder, pelvic abscess, s/p of ileostomy, chronic Foley placement, bilateral nephrostomies, PICC line placed in her right upper extremity, diabetes mellitus, hyperlipidemia, GERD, hypothyroidism, CKD 3, iron deficiency anemia, neutropenia, who presents with fever chills.  Pt states that she had bilateral nephrotomies placed in 09/2017, which were exchanged by interventional radiology yesterday. She developed fever of 103.2 and chills today.  She also has bilateral flank pain and suprapubic pressure.  She noticed blood in the bags.  She has nausea, no vomiting or diarrhea.  Patient denies chest pain, shortness breath, cough.  No unilateral weakness. She is not currently on chemotherapy or radiation therapy.  She states that she is getting regular weekly Neupogen injections for neutropenia, last dose was on Saturday.  Her urologist is Dr. Tresa Moore.  Nephrologist is Dr. Hollie Salk.  Oncologist is Dr. Denman [name] and Alvy Bimler.   ED Course: pt was found to have soft blood pressure, positive urinalysis with large amount of leukocyte, WBC 3.6, lactic acid 1.16, INR 1.16, stable renal function, temperature 100.7, tachycardia, tachypnea, oxygen saturation 95% on room air, negative chest x-ray.  Patient is admitted to telemetry bed as inpatient.  Review of Systems:   General: has fevers, chills, has fatigue HEENT: no blurry vision, hearing changes or sore throat Respiratory: no dyspnea, coughing,  wheezing CV: no chest pain, no palpitations GI: has nausea  no vomiting, diarrhea, constipation. Has suprapubic abdominal pressure. GU: no dysuria, burning on urination, increased urinary frequency, has hematuria  Ext: no leg edema Neuro: no unilateral weakness, numbness, or tingling, no vision change or hearing loss Skin: has sacral ulcer MSK: No muscle spasm, no deformity, no limitation of range of movement in spin Heme: No easy bruising.  Travel history: No recent long distant travel.  Allergy:  Allergies  Allergen Reactions  . Penicillins Swelling    Facial swelling/childhood allergy Has patient had a PCN reaction causing immediate rash, facial/tongue/throat swelling, SOB or lightheadedness with hypotension: Yes Has patient had a PCN reaction causing severe rash involving mucus membranes or skin necrosis: Yes Has patient had a PCN reaction that required hospitalization yes Has patient had a PCN reaction occurring within the last 10 years: No If all of the above answers are "NO", then may proceed with Cephalosporin use.   . Adhesive [Tape]     blisters  . Cefaclor Rash    Ceclor  . Erythromycin Other (See Comments)    Gastritis, abd cramps  . Trimethoprim Rash  . Ultram [Tramadol] Hives  . Fluconazole Rash  . Oxycodone Other (See Comments)    " I just feel weird"  . Pectin Rash    Pectin ring for stoma  . Septra [Sulfamethoxazole-Trimethoprim] Rash  . Sulfa Antibiotics Rash    Past Medical History:  Diagnosis Date  . Adrenal adenoma, left 02/08/2016   CT: stable benign  . Anemia in neoplastic disease   . Benign essential HTN   . 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 Cancer of corpus uteri, except isthmus [facility])  oncologist-- dr Denman [name] and dr Alvy Bimler    10-15-2004  dx endometroid  endometrial and ovarian cancer s/p  chemotheapy and surgery(TAH w/ BSO) :  recurrent 11-19-2014 post pelvic surgery and radiation 01-29-2015 to 03-10-2015  . Chronic idiopathic neutropenia (HCC)    presumed related to chemotherapy March 2006--- followed by dr Alvy Bimler (treatment w/ G-CSF injections  . Chronic nausea   . Chronic pain    perineal/ anal  area from bladder pad irritates skin , right flank pain  . CKD stage G2/A3, GFR 60-89 and albumin creatinine ratio >300 mg/g    nephrologist-  dr Madelon Lips  . Diabetic retinopathy, background ([place])   . Difficult intravenous access    small veins--- hx PICC lines  . DM type 2 (diabetes mellitus, type 2) ([place])    monitored by dr Legrand Como altheimer  . Dysuria   . Environmental and seasonal allergies   . Fatty liver 02/08/2016   CT  . Generalized muscle weakness   . GERD (gastroesophageal reflux disease)   . Hiatal hernia   . History of abdominal abscess 04/16/2017   post surgery 04-01-2017  --- resolved 10/ 2018  . History of gastric polyp    2014  duodenum  . History of ileus 04/16/2017   resolved w/ no surgical intervention  . History of radiation therapy    01-29-2015 to 03-10-2015  pelvis 50.4Gy  . Hypothyroidism    monitored by dr Legrand Como altheimer  . Ileostomy in place [facility]) 04/01/2017   created at same time colostomy takedown.  . Lower urinary tract symptoms (LUTS)    urge urinary  incontinence  . Mixed dyslipidemia   . Multiple thyroid nodules    Managed by Dr. Harlow Asa  . Pelvic abscess in female 04/16/2017  . PONV (postoperative nausea and vomiting)    "scopolamine patch works for me"  . Radiation-induced dermatitis    contact dermatitis , radiation completed, rash only on ankles now.  . Seasonal allergies   . Ureteral stricture, right UROLOGIT-  DR [facility]   CHRONIC--  TREATMENT URETERAL STENT  . 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 Vitamin D deficiency   . Wears glasses     Past Surgical History:    Procedure Laterality Date  . APPENDECTOMY    . BREAST RECONSTRUCTION WITH PLACEMENT OF TISSUE EXPANDER AND FLEX HD (ACELLULAR HYDRATED DERMIS) Bilateral 12/11/2015   Procedure: BILATERAL BREAST RECONSTRUCTION WITH PLACEMENT OF TISSUE EXPANDERS;  Surgeon: Irene Limbo, MD;  Location: [place];  Service: Plastics;  Laterality: Bilateral;  . COLONOSCOPY WITH PROPOFOL N/A 08/21/2013   Procedure: COLONOSCOPY WITH PROPOFOL;  Surgeon: Cleotis Nipper, MD;  [facility];  Service: Endoscopy;  Laterality: N/A;  . COLOSTOMY TAKEDOWN N/A 12/04/2014   Procedure: LAPROSCOPIC LYSIS OF ADHESIONS, SPLENIC MOBILIZATION, RELOCATION OF COLOSTOMY, DEBRIDEMENT INITIAL COLOSTOMY SITE;  Surgeon: Michael [name], MD;  [facility];  Service: General;  Laterality: N/A;  . CYSTOGRAM N/A 06/01/2017   Procedure: CYSTOGRAM;  Surgeon: Alexis Frock, MD;  [facility];  Service: Urology;  Laterality: N/A;  . CYSTOSCOPY W/ RETROGRADES Right 11/21/2015   Procedure: CYSTOSCOPY WITH RETROGRADE PYELOGRAM;  Surgeon: Alexis Frock, MD;  [facility];  Service: Urology;  Laterality: Right;  . CYSTOSCOPY W/ URETERAL STENT PLACEMENT Right 11/21/2015   Procedure: CYSTOSCOPY  WITH STENT REPLACEMENT;  Surgeon: Alexis Frock, MD;  [facility];  Service: Urology;  Laterality: Right;  . CYSTOSCOPY W/ URETERAL STENT PLACEMENT Right 03/10/2016   Procedure: CYSTOSCOPY WITH STENT REPLACEMENT;  Surgeon: Alexis Frock, MD;  Location: [facility];  Service: Urology;  Laterality: Right;  . CYSTOSCOPY W/ URETERAL STENT PLACEMENT Right 06/30/2016   Procedure: CYSTOSCOPY WITH RETROGRADE PYELOGRAM/URETERAL STENT EXCHANGE;  Surgeon: Alexis Frock, MD;  Location: [facility];  Service: Urology;  Laterality: Right;  . CYSTOSCOPY W/ URETERAL STENT PLACEMENT N/A 06/01/2017   Procedure: CYSTOSCOPY WITH EXAM UNDER ANESTHESIA;  Surgeon: Alexis Frock, MD;  [facility];  Service: Urology;  Laterality:  N/A;  . CYSTOSCOPY W/ URETERAL STENT PLACEMENT Right 08/17/2017   Procedure: CYSTOSCOPY WITH RETROGRADE PYELOGRAM/URETERAL STENT REMOVAL;  Surgeon: Alexis Frock, MD;  Location: [facility];  Service: Urology;  Laterality: Right;  . CYSTOSCOPY WITH RETROGRADE PYELOGRAM, URETEROSCOPY AND STENT PLACEMENT Right 03/20/2015   Procedure: CYSTOSCOPY WITH RETROGRADE PYELOGRAM, URETEROSCOPY WITH BALLOON DILATION AND STENT PLACEMENT ON RIGHT;  Surgeon: Alexis Frock, MD;  Location: [facility];  Service: Urology;  Laterality: Right;  . CYSTOSCOPY WITH RETROGRADE PYELOGRAM, URETEROSCOPY AND STENT PLACEMENT Right 05/02/2015   Procedure: CYSTOSCOPY WITH RIGHT RETROGRADE PYELOGRAM,  DIAGNOSTIC URETEROSCOPY AND STENT PULL ;  Surgeon: Alexis Frock, MD;  Location: [facility];  Service: Urology;  Laterality: Right;  . CYSTOSCOPY WITH RETROGRADE PYELOGRAM, URETEROSCOPY AND STENT PLACEMENT Right 09/05/2015   Procedure: CYSTOSCOPY WITH RETROGRADE PYELOGRAM,  AND STENT PLACEMENT;  Surgeon: Alexis Frock, MD;  [facility];  Service: Urology;  Laterality: Right;  . CYSTOSCOPY WITH RETROGRADE PYELOGRAM, URETEROSCOPY AND STENT PLACEMENT Right 04/01/2017   Procedure: CYSTOSCOPY WITH RETROGRADE PYELOGRAM, URETEROSCOPY AND STENT PLACEMENT;  Surgeon: Alexis Frock, MD;  [facility];  Service: Urology;  Laterality: Right;  . CYSTOSCOPY WITH STENT PLACEMENT Right 10/27/2016   Procedure: CYSTOSCOPY WITH STENT CHANGE and right retrograde pyelogram;  Surgeon: Alexis Frock, MD;  Location: [facility];  Service: Urology;  Laterality: Right;  . EUS N/A 10/02/2014   Procedure: LOWER ENDOSCOPIC ULTRASOUND (EUS);  Surgeon: Arta Silence, MD;  Location: [facility];  Service: Endoscopy;  Laterality: N/A;  . EXCISION SOFT TISSUE MASS RIGHT FOREMAN  12-08-2006  . EYE SURGERY  as child   pytosis of eyelids repair  . INCISION AND DRAINAGE OF WOUND Bilateral 12/26/2015    Procedure: DEBRIDEMENT OF BILATERAL MASTECTOMY FLAPS;  Surgeon: Irene Limbo, MD;  Location: [place];  Service: Plastics;  Laterality: Bilateral;  . IR CV LINE INJECTION  05/31/2017  . IR FLUORO GUIDE CV LINE LEFT  05/31/2017  . IR FLUORO GUIDE CV LINE RIGHT  04/06/2017  . IR FLUORO GUIDE CV MIDLINE PICC RIGHT  05/30/2017  . IR NEPHROSTOGRAM LEFT INITIAL PLACEMENT  09/02/2017  . IR NEPHROSTOGRAM RIGHT INITIAL PLACEMENT  09/02/2017  . IR NEPHROSTOGRAM RIGHT THRU EXISTING ACCESS  09/13/2017  . IR NEPHROSTOMY EXCHANGE LEFT  11/28/2017  . IR NEPHROSTOMY EXCHANGE RIGHT  10/02/2017  . IR NEPHROSTOMY EXCHANGE RIGHT  11/28/2017  . IR NEPHROSTOMY PLACEMENT LEFT  10/02/2017  . IR RADIOLOGIST EVAL & MGMT  05/03/2017  . IR US GUIDE VASC ACCESS LEFT  05/31/2017  . IR US GUIDE VASC ACCESS RIGHT  04/06/2017  . IR US GUIDE VASC ACCESS RIGHT  05/30/2017  . LAPAROSCOPIC CHOLECYSTECTOMY  1990  . LIPOSUCTION WITH LIPOFILLING Bilateral 04/16/2016   Procedure: LIPOSUCTION WITH LIPOFILLING TO BILATERAL CHEST;  Surgeon: Irene Limbo, MD;  Location: [place];  Service: Plastics;  Laterality: Bilateral;  . MASTECTOMY W/ SENTINEL NODE BIOPSY Bilateral 12/11/2015   Procedure: RIGHT PROPHYLACTIC MASTECTOMY, LEFT TOTAL MASTECTOMY WITH LEFT AXILLARY SENTINEL LYMPH NODE BIOPSY;  Surgeon: Stark Klein, MD;  Location: [place];  Service: General;  Laterality: Bilateral;  . OSTOMY N/A 11/19/2014   Procedure: OSTOMY;  Surgeon: Michael [name], MD;  [facility];  Service: General;  Laterality: N/A;  . PROCTOSCOPY N/A 04/01/2017   Procedure: RIDGE PROCTOSCOPY;  Surgeon: Michael [name], MD;  [facility];  Service: General;  Laterality: N/A;  . REMOVAL OF BILATERAL TISSUE EXPANDERS WITH PLACEMENT OF BILATERAL BREAST IMPLANTS Bilateral 04/16/2016   Procedure: REMOVAL OF BILATERAL TISSUE EXPANDERS WITH PLACEMENT OF BILATERAL BREAST IMPLANTS;  Surgeon: Irene Limbo, MD;  Location: [place];  Service: Plastics;  Laterality: Bilateral;  . ROBOTIC ASSISTED LAP VAGINAL HYSTERECTOMY N/A 11/19/2014   Procedure: ROBOTIC LYSIS OF ADHESIONS, CONVERTED TO LAPAROTOMY RADICAL UPPER VAGINECTOMY,LOW ANTERIOR BOWEL RESECTION, COLOSTOMY, BILATERAL URETERAL STENT PLACEMENT AND CYSTONOMY CLOSURE;  Surgeon: Everitt Amber, MD;  [facility];  Service: Gynecology;  Laterality: N/A;  . TISSUE EXPANDER FILLING Bilateral 12/26/2015   Procedure: EXPANSION OF BILATERAL CHEST TISSUE EXPANDERS (60 mL- Right; 75 mL- Left);  Surgeon: Irene Limbo, MD;  Location: [place];  Service: Plastics;  Laterality: Bilateral;  . TONSILLECTOMY    . TOTAL ABDOMINAL HYSTERECTOMY  March 2006   [facility]   and Bilateral Salpingoophorectomy/  staging for Ovarian cancer/  an  . XI ROBOTIC ASSISTED LOWER ANTERIOR RESECTION N/A 04/01/2017   Procedure: XI ROBOTIC VS LAPAROSCOPIC COLOSTOMY TAKEDOWN WITH LYSIS OF ADHESIONS.;  Surgeon: Michael [name], MD;  [facility];  Service: General;  Laterality: N/A;  ERAS PATHWAY    Social History:  reports that she has never smoked. She has never used smokeless tobacco. She reports that she drinks alcohol. She reports that she does not use drugs.  Family History:  Family History  Problem Relation Age of Onset  . Cancer Mother 39       stomach ca  . Hypertension Mother   . Cancer Father 24       prostate ca  . Diabetes Father   . Heart disease Father        CABG  . Breast cancer Maternal Aunt        dx in her 25s  . Lymphoma Paternal Aunt   . Brain cancer Paternal Grandfather   . Ovarian cancer Other   . Diabetes Sister   . Hypertension Brother y-10  . Heart disease Brother        CABG  . Diabetes Brother      Prior to Admission medications   Medication Sig Start Date End Date Taking? Authorizing Provider  acetaminophen (TYLENOL) 325 MG tablet Take 2 tablets (650 mg total) by mouth every 6 (six) hours as needed. 09/24/17   Roxan Hockey, MD    anastrozole (ARIMIDEX) 1 MG tablet Take 1 tablet (1 mg total) by mouth daily. 09/24/17   Roxan Hockey, MD  Biotin 5 MG TABS Take 5 mg by mouth every morning.     [provider]  Cholecalciferol (VITAMIN D3) 10000 UNITS capsule Take 10,000 Units by mouth once a week. Sunday evening's    [provider]  collagenase (SANTYL) ointment Apply 1 application topically daily.    [provider]  diphenhydrAMINE (BENADRYL) 25 mg capsule Take 1 capsule (25  mg total) by mouth every 8 (eight) hours as needed for itching, allergies or sleep. 09/24/17   Roxan Hockey, MD  diphenoxylate-atropine (LOMOTIL) 2.5-0.025 MG tablet Take 1 tablet by mouth 4 (four) times daily as needed for diarrhea or loose stools. TO PREVENT LOOSE BOWEL MOVEMENTS 09/24/17   Roxan Hockey, MD  famotidine (PEPCID) 20 MG tablet Take 20 mg by mouth daily as needed for heartburn or indigestion.    [provider]  fentaNYL (DURAGESIC) 50 MCG/HR Place 1 patch (50 mcg total) onto the skin every 3 (three) days. 11/22/17 12/22/17  Ann Held, DO  ferrous sulfate 325 (65 FE) MG tablet Take 1 tablet (325 mg total) by mouth at bedtime. 09/24/17   Roxan Hockey, MD  filgrastim (NEUPOGEN) 480 MCG/1.6ML injection Inject 480 mcg/1.6 ml under the skin every 6 days for life (last dose 09/22/17) 09/24/17   Emokpae, Courage, MD  insulin aspart (NOVOLOG) 100 UNIT/ML injection Inject 0-20 Units into the skin 3 (three) times daily with meals. Patient taking differently: Inject 2-12 Units into the skin See admin instructions. Per sliding scale; if BS <60, call MD. If BS 201-250=2 units, 251-300=4 units, 301-350=6 units, 351-400=8 units, 401-450=10 units, BS>450=12 units 09/24/17   Emokpae, Courage, MD  insulin aspart (NOVOLOG) 100 UNIT/ML injection Inject 7 Units into the skin 3 (three) times daily with meals. 09/24/17   Roxan Hockey, MD  levothyroxine (SYNTHROID, LEVOTHROID) 150 MCG tablet Take 1 tablet (150  mcg total) by mouth daily before breakfast. 09/25/17   Denton Brick, Courage, MD  lip balm (CARMEX) ointment Apply 1 application topically 2 (two) times daily. 09/24/17   Roxan Hockey, MD  loperamide (IMODIUM) 2 MG capsule Take 1-2 capsules (2-4 mg total) by mouth every 8 (eight) hours as needed for diarrhea or loose stools (Use if >2 BM every 8 hours). 09/24/17   Roxan Hockey, MD  loratadine (CLARITIN) 10 MG tablet Take 10 mg by mouth every morning.     [provider]  methocarbamol (ROBAXIN) 750 MG tablet Take 1 tablet (750 mg total) by mouth 3 (three) times daily. Patient taking differently: Take 750 mg by mouth 3 (three) times daily as needed for muscle spasms.  09/24/17   Roxan Hockey, MD  omega-3 acid ethyl esters (LOVAZA) 1 G capsule Take 1 g by mouth 2 (two) times daily.     [provider]  ondansetron (ZOFRAN-ODT) 4 MG disintegrating tablet Take 4 mg by mouth every 6 (six) hours as needed for nausea or vomiting.  04/12/17   [provider]  Polyethyl Glycol-Propyl Glycol (SYSTANE OP) Place 1 drop into both eyes at bedtime.     [provider]  pregabalin (LYRICA) 50 MG capsule Take 1 capsule (50 mg total) by mouth 2 (two) times daily. 11/08/17   Ann Held, DO  Prenatal Vit-Fe Fumarate-FA (PRENATAL VITAMIN PO) Take 1 capsule by mouth daily. Takes prenatal because there are no dyes in it    [provider]  prochlorperazine (COMPAZINE) 10 MG tablet Take 1 tablet (10 mg total) by mouth 2 (two) times daily as needed for nausea or vomiting. Patient taking differently: Take 10 mg by mouth 2 (two) times daily as needed for nausea or vomiting.  05/28/17   Virgel Manifold, MD  promethazine (PHENERGAN) 25 MG tablet Take 1 tablet (25 mg total) by mouth every 6 (six) hours as needed for nausea or vomiting. Patient taking differently: Take 25 mg by mouth 4 (four) times daily as needed for  nausea or vomiting.  05/28/17   Virgel Manifold, MD  protein  supplement shake (PREMIER PROTEIN) LIQD Take 325 mLs (11 oz total) by mouth 2 (two) times daily as needed (Pt to request). 09/24/17   Roxan Hockey, MD  psyllium (HYDROCIL/METAMUCIL) 95 % PACK Take 1 packet by mouth 2 (two) times daily. 09/24/17   Roxan Hockey, MD  ranitidine (ZANTAC) 150 MG tablet Take 1 tablet (150 mg total) by mouth 2 (two) times daily as needed for heartburn. 09/24/17   Roxan Hockey, MD  rosuvastatin (CRESTOR) 10 MG tablet Take 10 mg by mouth every evening.     [provider]  sitaGLIPtin (JANUVIA) 100 MG tablet Take 100 mg by mouth every morning.     [provider]  Tbo-Filgrastim (GRANIX) 480 MCG/0.8ML SOSY injection Inject 0.8 mLs (480 mcg total) into the skin daily. 11/28/17   Heath Lark, MD  tuberculin (TUBERSOL) 5 UNIT/0.1ML injection Inject 5 Units into the skin every 14 (fourteen) days.    [provider]    Physical Exam: Vitals:   11/29/17 0445 11/29/17 0500 11/29/17 0530 11/29/17 0600  BP: (!) 112/59 115/61 119/76 (!) 105/43  Pulse: 83 88 83   Resp: _0 Temp:      TempSrc:      SpO2: 99% 100% 100%   Weight:      Height:       General: Not in acute distress HEENT:       Eyes: PERRL, EOMI, no scleral icterus.       ENT: No discharge from the ears and nose, no pharynx injection, no tonsillar enlargement.        Neck: No JVD, no bruit, no mass felt. Heme: No neck lymph node enlargement. Cardiac: S1/S2, RRR, No murmurs, No gallops or rubs. Respiratory: No rales, wheezing, rhonchi or rubs. GI: Soft, nondistended, nontender, no rebound pain, no organomegaly, BS present. S/p of ileostomy. GU: has hematuria. S/p of bilateral nephrostomy Ext: No pitting leg edema bilaterally. 2+DP/PT pulse bilaterally. Musculoskeletal: No joint deformities, No joint redness or warmth, no limitation of ROM in spin. Skin: has stage II sacral ulcer Neuro: Alert, oriented X3, cranial nerves II-XII grossly intact, moves all extremities  normally.  Labs on Admission: I have personally reviewed following labs and imaging studies  CBC: Recent Labs  Lab 11/29/17 0156  WBC 3.6*  NEUTROABS 2.1  HGB 8.7*  HCT 28.4*  MCV 96.3  PLT 622   Basic Metabolic Panel: Recent Labs  Lab 11/29/17 0156  NA 138  K 3.7  CL 108  CO2 22  GLUCOSE 165*  BUN 21*  CREATININE 1.48*  CALCIUM 8.5*   GFR: Estimated Creatinine Clearance: 41.2 mL/min (A) (by C-G formula based on SCr of 1.48 mg/dL (H)). Liver Function Tests: Recent Labs  Lab 11/29/17 0156  AST 16  ALT 25  ALKPHOS 193*  BILITOT 0.3  PROT 6.6  ALBUMIN 2.7*   No results for input(s): LIPASE, AMYLASE in the last 168 hours. No results for input(s): AMMONIA in the last 168 hours. Coagulation Profile: Recent Labs  Lab 11/29/17 0156  INR 1.02   Cardiac Enzymes: No results for input(s): CKTOTAL, CKMB, CKMBINDEX, TROPONINI in the last 168 hours. BNP (last 3 results) No results for input(s): PROBNP in the last 8760 hours. HbA1C: No results for input(s): HGBA1C in the last 72 hours. CBG: No results for input(s): GLUCAP in the last 168 hours. Lipid Profile: No results for input(s): CHOL, HDL, LDLCALC, TRIG,  CHOLHDL, LDLDIRECT in the last 72 hours. Thyroid Function Tests: No results for input(s): TSH, T4TOTAL, FREET4, T3FREE, THYROIDAB in the last 72 hours. Anemia Panel: No results for input(s): VITAMINB12, FOLATE, FERRITIN, TIBC, IRON, RETICCTPCT in the last 72 hours. Urine analysis:    Component Value Date/Time   COLORURINE YELLOW 11/29/2017 0157   APPEARANCEUR CLOUDY (A) 11/29/2017 0157   LABSPEC 1.016 11/29/2017 0157   LABSPEC 1.015 11/17/2015 1436   PHURINE 5.0 11/29/2017 0157   GLUCOSEU NEGATIVE 11/29/2017 0157   GLUCOSEU 2,000 11/17/2015 1436   HGBUR LARGE (A) 11/29/2017 0157   BILIRUBINUR NEGATIVE 11/29/2017 0157   BILIRUBINUR neg 08/30/2016 1025   BILIRUBINUR Negative 11/17/2015 1436   KETONESUR NEGATIVE 11/29/2017 0157   PROTEINUR 100 (A)  11/29/2017 0157   UROBILINOGEN negative 08/30/2016 1025   UROBILINOGEN 0.2 11/17/2015 1436   NITRITE NEGATIVE 11/29/2017 0157   LEUKOCYTESUR LARGE (A) 11/29/2017 0157   LEUKOCYTESUR Moderate 11/17/2015 1436   Sepsis Labs: _0 (procalcitonin:4,lacticidven:4) )No results found for this or any previous visit (from the past 240 hour(s)).   Radiological Exams on Admission: Ct Abdomen Pelvis W Contrast  Result Date: 11/29/2017 CLINICAL DATA:  Bladder pressure, fever and chills. Blood in the [place] me back after tube placement. 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 EXAM: CT ABDOMEN AND PELVIS WITH CONTRAST TECHNIQUE: Multidetector CT imaging of the abdomen and pelvis was performed using the standard protocol following bolus administration of intravenous contrast. CONTRAST:  52m OMNIPAQUE IOHEXOL 300 MG/ML  SOLN COMPARISON:  10/02/2017 FINDINGS: Lower chest: Bilateral breast implants.  Lung bases are clear. Hepatobiliary: No focal liver abnormality is seen. Status post cholecystectomy. No biliary dilatation. Pancreas: Unremarkable. No pancreatic ductal dilatation or surrounding inflammatory changes. Spleen: Normal in size without focal abnormality. Adrenals/Urinary Tract: Left adrenal gland nodule measuring 1.7 cm. Indeterminate. No change since prior study. Bilateral percutaneous nephrostomy tubes. Pigtails reside in the renal collecting systems bilaterally. No hydronephrosis. Gas in the renal collecting systems is likely related to catheter insertion. Hazy infiltration in the renal hila bilaterally may represent edema, inflammatory change, or hematoma. Bladder is decompressed with a Foley catheter. Stomach/Bowel: Partial colectomy with right upper quadrant colostomy. Stomach, small bowel, and colon are not abnormally distended. A loop of bowel in the pelvis, probably small-bowel, demonstrates thickened wall suggesting enteritis or bowel wall edema. If there has been a  history of pelvic radiation therapy this could be postradiation change. Vascular/Lymphatic: Normal caliber abdominal aorta. Scattered lymph nodes in the retroperitoneum are prominent but not pathologically enlarged. No change since prior study. Reproductive: Surgical absence of the uterus. No abnormal pelvic masses. Other: No free air in the abdomen. Small amount of free fluid in the pelvis is similar to prior study. There is prominent residual presacral soft tissue with surgical clips present. This is similar to prior study and may represent postoperative scarring. Follow up on subsequent imaging is recommended to exclude development of residual or recurrent tumor. Musculoskeletal: No destructive bone lesions. IMPRESSION: 1. Bilateral percutaneous nephrostomy tubes with decompression of the renal collecting systems. Hazy infiltration in the renal hila bilaterally could represent edema, inflammatory change, or hematoma. 2. Pelvic small bowel demonstrates thickened wall suggesting enteritis or edema. Possible radiation change. 3. Small amount of free fluid in the pelvis is nonspecific but likely reactive. No change since prior study. 4. Prominent presacral soft tissues and area of postoperative change. This could represent postoperative scarring. Attention on follow-up imaging to exclude residual or recurrent tumor. No change since prior  study. 5. 1.7 cm left adrenal gland nodule is unchanged since prior study. Electronically Signed   By: Lucienne Capers M.D.   On: 11/29/2017 04:27   Dg Chest Port 1 View  Result Date: 11/29/2017 CLINICAL DATA:  Fever EXAM: PORTABLE CHEST 1 VIEW COMPARISON:  10/03/2017 FINDINGS: The heart size and mediastinal contours are within normal limits. Both lungs are clear. The visualized skeletal structures are unremarkable. Right PICC line with tip over the mid SVC region. No pneumothorax. Surgical clips in the left axilla. Probable drainage catheters projected over the upper abdomen,  incompletely visualized. IMPRESSION: No active disease. Electronically Signed   By: Lucienne Capers M.D.   On: 11/29/2017 03:11   Ir Nephrostomy Exchange Left  Result Date: 11/28/2017 INDICATION: 62 year old female with a history of endometrial cancer complicated by bilateral ureteral obstruction. Bilateral percutaneous nephrostomy tubes were placed on 09/02/2017. She presents today for routine exchange. Of note, she has had significant difficulties with her right-sided percutaneous nephrostomy tube which has been painful since the time of placement. EXAM: Bilateral nephrostomy tube exchange COMPARISON:  None. MEDICATIONS: None ANESTHESIA/SEDATION: None CONTRAST:  10 mL Omnipaque 300-administered into the collecting system(s) FLUOROSCOPY TIME:  Fluoroscopy Time: 3 minutes 12 seconds (58.6 mGy). COMPLICATIONS: None immediate. PROCEDURE: Informed written consent was obtained from the patient after a thorough discussion of the procedural risks, benefits and alternatives. All questions were addressed. Maximal Sterile Barrier Technique was utilized including caps, mask, sterile gowns, sterile gloves, sterile drape, hand hygiene and skin antiseptic. A timeout was performed prior to the initiation of the procedure. Gentle hand injection of contrast material through the left percutaneous nephrostomy tube demonstrates the tube enters via an interpolar calyx and is well formed in the renal pelvis. The renal pelvis is relatively small with respect to the tube. The tube was cut and removed over a short Amplatz wire. A new Cook 12 French percutaneous nephrostomy tube was advanced over the wire and formed. The tube was secured to the skin with 0 Prolene suture and an adhesive fixation device. Images were obtained and stored for the medical record. Attention was turned to the left tube. A gentle hand injection of contrast material was again performed. The tube enters via and upper pole calyx. The tube is large relative to the  size of the collecting system. The tube was cut and removed over a short Amplatz wire. An attempt was made to replace with another 12 percutaneous nephrostomy tube, however the tube was difficult to form in the relatively small bifid renal pelvis. The patient expressed discomfort. Therefore, the tube was discarded and a 12 French Bettey Mare drainage catheter was advanced over the wire and formed in the renal pelvis. This catheter was then secured to the skin with 0 Prolene suture and an adhesive fixation device. The patient tolerated the procedure relatively well. IMPRESSION: 1. Exchange for a left 12 French percutaneous nephrostomy tube. 2. Exchange for a right 12 French Bettey Mare nephrostomy tube due to small renal pelvis size and patient discomfort. Signed, Criselda Peaches, MD [facility] Electronically Signed   By: Jacqulynn Cadet M.D.   On: 11/28/2017 15:56   Ir Nephrostomy Exchange Right  Result Date: 11/28/2017 INDICATION: 62 year old female with a history of endometrial cancer complicated by bilateral ureteral obstruction. Bilateral percutaneous nephrostomy tubes were placed on 09/02/2017. She presents today for routine exchange. Of note, she has had significant difficulties with her right-sided percutaneous nephrostomy tube which has been painful since the  time of placement. EXAM: Bilateral nephrostomy tube exchange COMPARISON:  None. MEDICATIONS: None ANESTHESIA/SEDATION: None CONTRAST:  10 mL Omnipaque 300-administered into the collecting system(s) FLUOROSCOPY TIME:  Fluoroscopy Time: 3 minutes 12 seconds (58.6 mGy). COMPLICATIONS: None immediate. PROCEDURE: Informed written consent was obtained from the patient after a thorough discussion of the procedural risks, benefits and alternatives. All questions were addressed. Maximal Sterile Barrier Technique was utilized including caps, mask, sterile gowns, sterile gloves, sterile  drape, hand hygiene and skin antiseptic. A timeout was performed prior to the initiation of the procedure. Gentle hand injection of contrast material through the left percutaneous nephrostomy tube demonstrates the tube enters via an interpolar calyx and is well formed in the renal pelvis. The renal pelvis is relatively small with respect to the tube. The tube was cut and removed over a short Amplatz wire. A new Cook 12 French percutaneous nephrostomy tube was advanced over the wire and formed. The tube was secured to the skin with 0 Prolene suture and an adhesive fixation device. Images were obtained and stored for the medical record. Attention was turned to the left tube. A gentle hand injection of contrast material was again performed. The tube enters via and upper pole calyx. The tube is large relative to the size of the collecting system. The tube was cut and removed over a short Amplatz wire. An attempt was made to replace with another 12 percutaneous nephrostomy tube, however the tube was difficult to form in the relatively small bifid renal pelvis. The patient expressed discomfort. Therefore, the tube was discarded and a 12 French Bettey Mare drainage catheter was advanced over the wire and formed in the renal pelvis. This catheter was then secured to the skin with 0 Prolene suture and an adhesive fixation device. The patient tolerated the procedure relatively well. IMPRESSION: 1. Exchange for a left 12 French percutaneous nephrostomy tube. 2. Exchange for a right 12 French Bettey Mare nephrostomy tube due to small renal pelvis size and patient discomfort. Signed, Criselda Peaches, MD [facility] Electronically Signed   By: Jacqulynn Cadet M.D.   On: 11/28/2017 15:56     EKG: Independently reviewed.  Sinus rhythm, QTc 450, right axis deviation, low voltage.   Assessment/Plan Principal Problem:   Acute pyelonephritis Active  Problems:   Primary hypothyroidism   History of ovarian & endometrial cancer   Chronic anemia   Breast cancer of upper-inner quadrant of left female breast (HCC)   Chronic kidney disease (CKD), stage III (moderate) (HCC)   Decubitus ulcer of sacral region, stage 2   Sepsis (HCC)   HLD (hyperlipidemia)   GERD (gastroesophageal reflux disease)   Type II diabetes mellitus with renal manifestations ([place])   Sepsis due to acute pyelonephritis: pt has hx of positive Ux with MRSA and Serratia marcescens. Now she is septic with neutropenia, fever, tachycardia and tachypnea.  Blood pressure is soft, which responded to IV fluid.  Currently hemodynamically stable.   - Admit to telemetry bed as inpt -  IV vanco and aztreonam (patient also received 1 dose of Levaquin in ED) - Follow up results of urine and blood cx and amend antibiotic regimen if needed per sensitivity results - prn Zofran for nausea - will get Procalcitonin and trend lactic acid levels per sepsis protocol. - IVF: 3L of NS bolus in ED, followed by 125 cc/h  - f/u Bx and Ux  Hypothyroidism: Last TSH was 2.408 on 08/30/17 -Continue home Synthroid  Type  II diabetes mellitus with renal manifestations ([place]): Last A1c 7.9 on 08/14/17, not well controled. Patient is taking lantus Januvia and NovoLog at home -SSI -decreased lantus dose from 14 to 10 U bid  History of ovarian & endometrial cancer: s/p of chemotherapy and XRT with TAH and BSO.  -f/u with Dr. Addison Bailey  Breast cancer of upper-inner quadrant of left female breast: s/p of bilateral mastectomy -Continue anastrozole  Chronic anemia: Hemoglobin 9.5 on 10/13/2017-->8.7 today. slightly decreased. -f/u by CBC -continue iron supplement.  Chronic kidney disease (CKD), stage III (moderate) ([place]): stable.  Baseline creatinine 1.2-1.5.  Her creatinine is 1.48, BUN 21 -Follow-up renal function.  BMP  Decubitus ulcer of sacral region, stage 2: -wound care consult  HLD  (hyperlipidemia): -creastor  GERD: -Pepcid   Neutropenia: WBC 3.6.  Patient is getting Neupogen injection weekly.  Last dose was on last Saturday.  Next dose is on Friday -f/u by CBC   DVT ppx: SCD due to hematuria Code Status: Full code Family Communication:  patient's husband at bed side Disposition Plan:  Anticipate discharge back to previous home environment Consults called:  none Admission status: Inpatient/tele       Date of Service 11/29/2017    Ivor Costa [facility] Pager [phone]  If 7PM-7AM, please contact night-coverage [email or web address] Password [facility] 11/29/2017, 6:52 AM

## 2017-11-30 DIAGNOSIS — N10 Acute tubulo-interstitial nephritis: Secondary | ICD-10-CM

## 2017-11-30 DIAGNOSIS — L89152 Pressure ulcer of sacral region, stage 2: Secondary | ICD-10-CM

## 2017-11-30 DIAGNOSIS — N183 Chronic kidney disease, stage 3 (moderate): Secondary | ICD-10-CM

## 2017-11-30 DIAGNOSIS — C50212 Malignant neoplasm of upper-inner quadrant of left female breast: Secondary | ICD-10-CM

## 2017-11-30 DIAGNOSIS — E860 Dehydration: Secondary | ICD-10-CM | POA: Diagnosis not present

## 2017-11-30 DIAGNOSIS — R509 Fever, unspecified: Secondary | ICD-10-CM

## 2017-11-30 LAB — CBC
HEMATOCRIT: 23.9 % — AB (ref 36.0–46.0)
HEMOGLOBIN: 7.3 g/dL — AB (ref 12.0–15.0)
MCH: 29.6 pg (ref 26.0–34.0)
MCHC: 30.5 g/dL (ref 30.0–36.0)
MCV: 96.8 fL (ref 78.0–100.0)
Platelets: 223 10*3/uL (ref 150–400)
RBC: 2.47 MIL/uL — ABNORMAL LOW (ref 3.87–5.11)
RDW: 17.4 % — ABNORMAL HIGH (ref 11.5–15.5)
WBC: 2.3 10*3/uL — AB (ref 4.0–10.5)

## 2017-11-30 LAB — BASIC METABOLIC PANEL
ANION GAP: 9 (ref 5–15)
BUN: 13 mg/dL (ref 6–20)
CALCIUM: 8.4 mg/dL — AB (ref 8.9–10.3)
CO2: 22 mmol/L (ref 22–32)
Chloride: 108 mmol/L (ref 101–111)
Creatinine, Ser: 1.31 mg/dL — ABNORMAL HIGH (ref 0.44–1.00)
GFR calc non Af Amer: 42 mL/min — ABNORMAL LOW (ref >60)
GFR, EST AFRICAN AMERICAN: 49 mL/min — AB (ref >60)
Glucose, Bld: 94 mg/dL (ref 65–99)
Potassium: 3.4 mmol/L — ABNORMAL LOW (ref 3.5–5.1)
Sodium: 139 mmol/L (ref 135–145)

## 2017-11-30 LAB — GLUCOSE, CAPILLARY
GLUCOSE-CAPILLARY: 130 mg/dL — AB (ref 65–99)
Glucose-Capillary: 86 mg/dL (ref 65–99)
Glucose-Capillary: 101 mg/dL — ABNORMAL HIGH (ref 65–99)
Glucose-Capillary: 146 mg/dL — ABNORMAL HIGH (ref 65–99)

## 2017-11-30 LAB — MAGNESIUM: MAGNESIUM: 1.1 mg/dL — AB (ref 1.7–2.4)

## 2017-11-30 MED ORDER — PANTOPRAZOLE SODIUM 40 MG PO TBEC: 40.0000 mg | DELAYED_RELEASE_TABLET | Freq: Every day | ORAL | Status: DC

## 2017-11-30 MED ORDER — MAGNESIUM SULFATE 2 GM/50ML IV SOLN
2.0000 g | Freq: Once | INTRAVENOUS | Status: AC
  Administered 2017-11-30: 2 g via INTRAVENOUS
  Filled 2017-11-30: qty 50

## 2017-11-30 NOTE — Progress Notes (Signed)
[name], is a 62 y.o. female, DOB - 10/03/1955, DZH:[id number]   [name] is a 62 y.o. female with medical history significant of endometrial cancer (s/p of chemotherapy and XRT with TAH and BSO), left-sided breast cancer (s/p of bilateral mastectomies), vesiculo- rectal fistula, partial resection of bladder, pelvic abscess, s/p of ileostomy, chronic Foley placement, bilateral nephrostomies, high output ileostomy requiring weekly IV fluid support, long-term PICC line in her right upper extremity, diabetes mellitus, hyperlipidemia, GERD, hypothyroidism, CKD 4 baseline creatinine of close to 1.4, iron deficiency anemia, neutropenia, who presented with fever chills.  Pt has bilateral nephrotomies placed in 09/2017, which were exchanged by interventional radiology on 11/28/2017. She developed fever of 103.2 and chills overnight following the procedure.    she was diagnosed with sepsis likely source pyelonephritis which was suggested on the CT scan done in the ER.  She has been placed on IV vancomycin and aztreonam, Her urologist is Dr. Tresa Moore.Nephrologist is Dr.Upton. Oncologist is Dr. Denman [name] and Alvy Bimler.   Assessment plan  Sepsis due to acute pyelonephritis -Following recent nephrostomy tube exchange on 4/29 in interventional radiology -Urine culture with Coumadin 100,000 colonies of gram-negative rods -Clinically improving, stop IV fluids -Stop vancomycin, continue aztreonam -Blood cultures are negative  Acute on chronic anemia -Baseline hemoglobin around 9, she is intermittently transfusion dependent due to anemia of chronic disease -Hemoglobin down to 7.3, no overt bleeding, this is dilutional as she is 4 L positive -Stop IV fluids today, monitor CBC, transfuse 1 unit PRBC tomorrow if hemoglobin trends down further  Hypothyroidism: Last TSH was 2.408 on 08/30/17 -Continue home Synthroid  Type II  diabetes mellitus with renal manifestations ([place]): Last A1c 7.9 on 08/14/17, not well controled. Patient is taking lantus Januvia and NovoLog at home -continue low-dose Lantus and sliding-scale insulin  History of ovarian & endometrial cancer: s/p of chemotherapy and XRT with TAH and BSO.  -f/u with Dr. Alvy Bimler  1.7 cm left adrenal gland nodule,  -unchanged from prior imaging -follow-up with oncology  High-output ileostomy and vesicular rectal fistula, bilateral nephrostomy tubes -Being followed by interventional radiology, urology Dr. Tresa Moore and general surgeon Dr. 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 slightly decreased. -f/u by CBC -continue iron supplement.  Chronic kidney disease (CKD), stage III (moderate) ([place]): stable.  Baseline creatinine 1.2-1.5.  Her creatinine is 1.48, BUN 21 -improving, stop IV fluids today  Decubitus ulcer of sacral region, stage 2: -wound care consult  HLD (hyperlipidemia): -creastor  GERD: -Pepcid   Neutropenia: WBC 3.6.  Patient is getting Neupogen injection weekly.  Last dose was on last Saturday.  Next dose is on Friday -f/u by CBC   DVT ppx: SCD due to hematuria Code Status: Full code Family Communication:  no family at bed side Disposition Plan:  home tomorrow if stable     Vitals:   11/29/17 2038 11/29/17 2131 11/30/17 0446 11/30/17 1003  BP: (!) 128/59  (!) 114/56 (!) 138/59  Pulse: 86  68 76  Resp:    20  Temp: 98.8 F (37.1 C)  (!) 97.4 F (36.3 C) 98.1 F (36.7 C)  TempSrc: Oral  Oral Oral  SpO2: 100%  99% 100%  Weight:  99.8 kg (220  lb)    Height:        Gen: Awake, Alert, Oriented X 3, pleasant female, sitting up  in bed, no distress HEENT: PERRLA, Neck supple, no JVD Lungs: Good air movement bilaterally, CTAB CVS: S1-S2/regular rate rhythm Abd: soft, Non tender, non distended, BS present, ileostomy noted, bilateral nephrostomy tubes noted, flank tenderness resolved Extremities: No Cyanosis, Clubbing or edema Skin: no new rashes     Data Review   Micro Results Recent Results (from the past 240 hour(s))  Urine culture     Status: Abnormal (Preliminary result)   Collection Time: 11/29/17  1:57 AM  Result Value Ref Range Status   Specimen Description URINE, RANDOM  Final   Special Requests   Final    NONE Performed at [facility], [address], [place], [place] [address]    Culture >=100,000 COLONIES/mL KLEBSIELLA PNEUMONIAE (A)  Final   Report Status PENDING  Incomplete  MRSA PCR Screening     Status: None   Collection Time: 11/29/17  6:07 PM  Result Value Ref Range Status   MRSA by PCR NEGATIVE NEGATIVE Final    Comment:        The GeneXpert MRSA Assay (FDA approved for NASAL specimens only), is one component of a comprehensive MRSA colonization surveillance program. It is not intended to diagnose MRSA infection nor to guide or monitor treatment for MRSA infections. Performed at [facility], [place] [address] [place], [place] [address]     Radiology Reports Ct Abdomen Pelvis W Contrast  Result Date: 11/29/2017 CLINICAL DATA:  Bladder pressure, fever and chills. Blood in the [place] me back after tube placement. 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 EXAM: CT ABDOMEN AND PELVIS WITH CONTRAST TECHNIQUE: Multidetector CT imaging of the abdomen and pelvis was performed using the standard protocol following bolus administration of intravenous contrast. CONTRAST:  64mL OMNIPAQUE IOHEXOL 300 MG/ML  SOLN COMPARISON:  10/02/2017 FINDINGS: Lower chest: Bilateral breast implants.  Lung bases are clear. Hepatobiliary: No focal liver abnormality is  seen. Status post cholecystectomy. No biliary dilatation. Pancreas: Unremarkable. No pancreatic ductal dilatation or surrounding inflammatory changes. Spleen: Normal in size without focal abnormality. Adrenals/Urinary Tract: Left adrenal gland nodule measuring 1.7 cm. Indeterminate. No change since prior study. Bilateral percutaneous nephrostomy tubes. Pigtails reside in the renal collecting systems bilaterally. No hydronephrosis. Gas in the renal collecting systems is likely related to catheter insertion. Hazy infiltration in the renal hila bilaterally may represent edema, inflammatory change, or hematoma. Bladder is decompressed with a Foley catheter. Stomach/Bowel: Partial colectomy with right upper quadrant colostomy. Stomach, small bowel, and colon are not abnormally distended. A loop of bowel in the pelvis, probably small-bowel, demonstrates thickened wall suggesting enteritis or bowel wall edema. If there has been a history of pelvic radiation therapy this could be postradiation change. Vascular/Lymphatic: Normal caliber abdominal aorta. Scattered lymph nodes in the retroperitoneum are prominent but not pathologically enlarged. No change since prior study. Reproductive: Surgical absence of the uterus. No abnormal pelvic masses. Other: No free air in the abdomen. Small amount of free fluid in the pelvis is similar to prior study. There is prominent residual presacral soft tissue with surgical clips present. This is similar to prior study and may represent postoperative scarring. Follow up on subsequent imaging is recommended to exclude development of residual or recurrent tumor. Musculoskeletal: No destructive bone lesions. IMPRESSION: 1. Bilateral percutaneous nephrostomy tubes with decompression of the renal collecting systems. Hazy infiltration  in the renal hila bilaterally could represent edema, inflammatory change, or hematoma. 2. Pelvic small bowel demonstrates thickened wall suggesting enteritis or  edema. Possible radiation change. 3. Small amount of free fluid in the pelvis is nonspecific but likely reactive. No change since prior study. 4. Prominent presacral soft tissues and area of postoperative change. This could represent postoperative scarring. Attention on follow-up imaging to exclude residual or recurrent tumor. No change since prior study. 5. 1.7 cm left adrenal gland nodule is unchanged since prior study. Electronically Signed   By: Lucienne Capers M.D.   On: 11/29/2017 04:27   Dg Chest Port 1 View  Result Date: 11/29/2017 CLINICAL DATA:  Fever EXAM: PORTABLE CHEST 1 VIEW COMPARISON:  10/03/2017 FINDINGS: The heart size and mediastinal contours are within normal limits. Both lungs are clear. The visualized skeletal structures are unremarkable. Right PICC line with tip over the mid SVC region. No pneumothorax. Surgical clips in the left axilla. Probable drainage catheters projected over the upper abdomen, incompletely visualized. IMPRESSION: No active disease. Electronically Signed   By: Lucienne Capers M.D.   On: 11/29/2017 03:11   Ir Nephrostogram Left Thru Existing Access  Result Date: 11/29/2017 INDICATION: Sepsis. Patient had bilateral nephrostomy catheter exchange yesterday without apparent complication. EXAM: BILATERAL ANTEGRADE NEPHROSTOGRAM THROUGH EXISTING CATHETER COMPARISON:  Previous day's exam MEDICATIONS: None indicated ANESTHESIA/SEDATION: None required CONTRAST:  15 mL Isovue-administered into the collecting system(s) FLUOROSCOPY TIME:  36 seconds; 5 mGy COMPLICATIONS: None immediate. PROCEDURE: Informed written consent was obtained from the patient after a thorough discussion of the procedural risks, benefits and alternatives. All questions were addressed. Contrast was sequentially injected under fluoroscopy through the right and left nephrostomy catheters. Both catheters remain well positioned centrally within the renal collecting systems. No hydronephrosis. No filling  defects. Proximal ureters decompressed. IMPRESSION: 1. Bilateral nephrostomy catheters are well position and patent. Electronically Signed   By: Lucrezia Europe M.D.   On: 11/29/2017 09:46   Ir Nephrostogram Right Thru Existing Access  Result Date: 11/29/2017 INDICATION: Sepsis. Patient had bilateral nephrostomy catheter exchange yesterday without apparent complication. EXAM: BILATERAL ANTEGRADE NEPHROSTOGRAM THROUGH EXISTING CATHETER COMPARISON:  Previous day's exam MEDICATIONS: None indicated ANESTHESIA/SEDATION: None required CONTRAST:  15 mL Isovue-administered into the collecting system(s) FLUOROSCOPY TIME:  36 seconds; 5 mGy COMPLICATIONS: None immediate. PROCEDURE: Informed written consent was obtained from the patient after a thorough discussion of the procedural risks, benefits and alternatives. All questions were addressed. Contrast was sequentially injected under fluoroscopy through the right and left nephrostomy catheters. Both catheters remain well positioned centrally within the renal collecting systems. No hydronephrosis. No filling defects. Proximal ureters decompressed. IMPRESSION: 1. Bilateral nephrostomy catheters are well position and patent. Electronically Signed   By: Lucrezia Europe M.D.   On: 11/29/2017 09:46   Ir Nephrostomy Exchange Left  Result Date: 11/28/2017 INDICATION: 61 year old female with a history of endometrial cancer complicated by bilateral ureteral obstruction. Bilateral percutaneous nephrostomy tubes were placed on 09/02/2017. She presents today for routine exchange. Of note, she has had significant difficulties with her right-sided percutaneous nephrostomy tube which has been painful since the time of placement. EXAM: Bilateral nephrostomy tube exchange COMPARISON:  None. MEDICATIONS: None ANESTHESIA/SEDATION: None CONTRAST:  10 mL Omnipaque 300-administered into the collecting system(s) FLUOROSCOPY TIME:  Fluoroscopy Time: 3 minutes 12 seconds (58.6 mGy). COMPLICATIONS: None  immediate. PROCEDURE: Informed written consent was obtained from the patient after a thorough discussion of the procedural risks, benefits and alternatives. All questions were addressed. Maximal Sterile Barrier Technique  was utilized including caps, mask, sterile gowns, sterile gloves, sterile drape, hand hygiene and skin antiseptic. A timeout was performed prior to the initiation of the procedure. Gentle hand injection of contrast material through the left percutaneous nephrostomy tube demonstrates the tube enters via an interpolar calyx and is well formed in the renal pelvis. The renal pelvis is relatively small with respect to the tube. The tube was cut and removed over a short Amplatz wire. A new Cook 12 French percutaneous nephrostomy tube was advanced over the wire and formed. The tube was secured to the skin with 0 Prolene suture and an adhesive fixation device. Images were obtained and stored for the medical record. Attention was turned to the left tube. A gentle hand injection of contrast material was again performed. The tube enters via and upper pole calyx. The tube is large relative to the size of the collecting system. The tube was cut and removed over a short Amplatz wire. An attempt was made to replace with another 12 percutaneous nephrostomy tube, however the tube was difficult to form in the relatively small bifid renal pelvis. The patient expressed discomfort. Therefore, the tube was discarded and a 12 French Bettey Mare drainage catheter was advanced over the wire and formed in the renal pelvis. This catheter was then secured to the skin with 0 Prolene suture and an adhesive fixation device. The patient tolerated the procedure relatively well. IMPRESSION: 1. Exchange for a left 12 French percutaneous nephrostomy tube. 2. Exchange for a right 12 French Bettey Mare nephrostomy tube due to small renal pelvis size and patient discomfort. Signed, Criselda Peaches, MD [facility] Radiology Electronically Signed   By: Jacqulynn Cadet M.D.   On: 11/28/2017 15:56   Ir Nephrostomy Exchange Right  Result Date: 11/28/2017 INDICATION: 62 year old female with a history of endometrial cancer complicated by bilateral ureteral obstruction. Bilateral percutaneous nephrostomy tubes were placed on 09/02/2017. She presents today for routine exchange. Of note, she has had significant difficulties with her right-sided percutaneous nephrostomy tube which has been painful since the time of placement. EXAM: Bilateral nephrostomy tube exchange COMPARISON:  None. MEDICATIONS: None ANESTHESIA/SEDATION: None CONTRAST:  10 mL Omnipaque 300-administered into the collecting system(s) FLUOROSCOPY TIME:  Fluoroscopy Time: 3 minutes 12 seconds (58.6 mGy). COMPLICATIONS: None immediate. PROCEDURE: Informed written consent was obtained from the patient after a thorough discussion of the procedural risks, benefits and alternatives. All questions were addressed. Maximal Sterile Barrier Technique was utilized including caps, mask, sterile gowns, sterile gloves, sterile drape, hand hygiene and skin antiseptic. A timeout was performed prior to the initiation of the procedure. Gentle hand injection of contrast material through the left percutaneous nephrostomy tube demonstrates the tube enters via an interpolar calyx and is well formed in the renal pelvis. The renal pelvis is relatively small with respect to the tube. The tube was cut and removed over a short Amplatz wire. A new Cook 12 French percutaneous nephrostomy tube was advanced over the wire and formed. The tube was secured to the skin with 0 Prolene suture and an adhesive fixation device. Images were obtained and stored for the medical record. Attention was turned to the left tube. A gentle hand injection of contrast material was again performed. The tube enters via and upper pole calyx. The tube is large relative to  the size of the collecting system. The tube was cut and removed over a short Amplatz wire. An attempt was made to replace with another 75  percutaneous nephrostomy tube, however the tube was difficult to form in the relatively small bifid renal pelvis. The patient expressed discomfort. Therefore, the tube was discarded and a 12 French Bettey Mare drainage catheter was advanced over the wire and formed in the renal pelvis. This catheter was then secured to the skin with 0 Prolene suture and an adhesive fixation device. The patient tolerated the procedure relatively well. IMPRESSION: 1. Exchange for a left 12 French percutaneous nephrostomy tube. 2. Exchange for a right 12 French Bettey Mare nephrostomy tube due to small renal pelvis size and patient discomfort. Signed, Criselda Peaches, MD [facility] Radiology Electronically Signed   By: Jacqulynn Cadet M.D.   On: 11/28/2017 15:56    CBC Recent Labs  Lab 11/29/17 0156 11/30/17 0457  WBC 3.6* 2.3*  HGB 8.7* 7.3*  HCT 28.4* 23.9*  PLT 314 223  MCV 96.3 96.8  MCH 29.5 29.6  MCHC 30.6 30.5  RDW 17.7* 17.4*  LYMPHSABS 0.6*  --   MONOABS 0.7  --   EOSABS 0.2  --   BASOSABS 0.0  --     Chemistries  Recent Labs  Lab 11/29/17 0156 11/30/17 0457  NA 138 139  K 3.7 3.4*  CL 108 108  CO2 22 22  GLUCOSE 165* 94  BUN 21* 13  CREATININE 1.48* 1.31*  CALCIUM 8.5* 8.4*  MG  --  1.1*  AST 16  --   ALT 25  --   ALKPHOS 193*  --   BILITOT 0.3  --    ------------------------------------------------------------------------------------------------------------------ estimated creatinine clearance is 49.5 mL/min (A) (by C-G formula based on SCr of 1.31 mg/dL (H)). ------------------------------------------------------------------------------------------------------------------ No results for input(s): HGBA1C in the last 72  hours. ------------------------------------------------------------------------------------------------------------------ No results for input(s): CHOL, HDL, LDLCALC, TRIG, CHOLHDL, LDLDIRECT in the last 72 hours. ------------------------------------------------------------------------------------------------------------------ No results for input(s): TSH, T4TOTAL, T3FREE, THYROIDAB in the last 72 hours.  Invalid input(s): FREET3 ------------------------------------------------------------------------------------------------------------------ No results for input(s): VITAMINB12, FOLATE, FERRITIN, TIBC, IRON, RETICCTPCT in the last 72 hours.  Coagulation profile Recent Labs  Lab 11/29/17 0156  INR 1.02    No results for input(s): DDIMER in the last 72 hours.  Cardiac Enzymes No results for input(s): CKMB, TROPONINI, MYOGLOBIN in the last 168 hours.  Invalid input(s): CK ------------------------------------------------------------------------------------------------------------------ Invalid input(s): POCBNP   Signature  Domenic Polite M.D on 11/30/2017 at 12:28 PM  Between 7am to 7pm - Pager via [email or web address] - password [facility]

## 2017-12-01 LAB — CBC
HCT: 24.1 % — ABNORMAL LOW (ref 36.0–46.0)
Hemoglobin: 7.4 g/dL — ABNORMAL LOW (ref 12.0–15.0)
MCH: 29.4 pg (ref 26.0–34.0)
MCHC: 30.7 g/dL (ref 30.0–36.0)
MCV: 95.6 fL (ref 78.0–100.0)
PLATELETS: 254 10*3/uL (ref 150–400)
RBC: 2.52 MIL/uL — ABNORMAL LOW (ref 3.87–5.11)
RDW: 17.1 % — AB (ref 11.5–15.5)
WBC: 2.6 10*3/uL — ABNORMAL LOW (ref 4.0–10.5)

## 2017-12-01 LAB — GLUCOSE, CAPILLARY
Glucose-Capillary: 64 mg/dL — ABNORMAL LOW (ref 65–99)
Glucose-Capillary: 90 mg/dL (ref 65–99)
Glucose-Capillary: 107 mg/dL — ABNORMAL HIGH (ref 65–99)

## 2017-12-01 LAB — BASIC METABOLIC PANEL
Anion gap: 6 (ref 5–15)
BUN: 13 mg/dL (ref 6–20)
CALCIUM: 8.5 mg/dL — AB (ref 8.9–10.3)
CO2: 22 mmol/L (ref 22–32)
CREATININE: 1.38 mg/dL — AB (ref 0.44–1.00)
Chloride: 112 mmol/L — ABNORMAL HIGH (ref 101–111)
GFR calc Af Amer: 46 mL/min — ABNORMAL LOW (ref >60)
GFR, EST NON AFRICAN AMERICAN: 40 mL/min — AB (ref >60)
GLUCOSE: 107 mg/dL — AB (ref 65–99)
Potassium: 3.6 mmol/L (ref 3.5–5.1)
Sodium: 140 mmol/L (ref 135–145)

## 2017-12-01 LAB — PREPARE RBC (CROSSMATCH)

## 2017-12-01 MED ORDER — HEPARIN SOD (PORK) LOCK FLUSH 100 UNIT/ML IV SOLN
250.0000 [IU] | INTRAVENOUS | Status: AC | PRN
  Administered 2017-12-01: 500 [IU]

## 2017-12-01 MED ORDER — SODIUM CHLORIDE 0.9 % IV SOLN
Freq: Once | INTRAVENOUS | Status: AC
  Administered 2017-12-01: 12:00:00 via INTRAVENOUS

## 2017-12-01 MED ORDER — CIPROFLOXACIN HCL 500 MG PO TABS: 500.0000 mg | ORAL_TABLET | Freq: Two times a day (BID) | ORAL | 0 refills | Status: DC

## 2017-12-01 MED ORDER — CIPROFLOXACIN HCL 500 MG PO TABS: 500.0000 mg | ORAL_TABLET | Freq: Two times a day (BID) | ORAL | Status: DC

## 2017-12-01 NOTE — Discharge Summary (Signed)
Physician Discharge Summary  [name] MVE:[id number] DOB: 03-14-1956 DOA: 11/29/2017  PCP: Ann Held, DO  Admit date: 11/29/2017 Discharge date: 12/01/2017  Time spent: 35 minutes  Recommendations for Outpatient Follow-up: 1. PCP Dr.Lowne in 1 week, please check CBC at FU 2. Fu with Doctors Justin Mend and [place] as previously scheduled   Discharge Diagnoses:    Sepsis   Acute pyelonephritis   Primary hypothyroidism   History of ovarian & endometrial cancer   Chronic anemia   Breast cancer of upper-inner quadrant of left female breast ([place])   Chronic kidney disease (CKD), stage III (moderate) (HCC)   Decubitus ulcer of sacral region, stage 2   Sepsis ([place])   HLD (hyperlipidemia)   GERD (gastroesophageal reflux disease)   Type II diabetes mellitus with renal manifestations [facility])   Discharge Condition: stable  Diet recommendation: DM heart healthy  Filed Weights   11/29/17 0149 11/29/17 2131 11/30/17 2110  Weight: 90.7 kg (200 lb) 99.8 kg (220 lb) 99.7 kg (219 lb 12.8 oz)    History of present illness:  [name] a 62 y.o.femalewith medical history significant ofendometrial cancer(s/p ofchemotherapy and XRT with TAH and BSO),left-sided breast cancer(s/p ofbilateral mastectomies),vesiculo- rectal fistula, partial resection of bladder,pelvic abscess, s/p of ileostomy, chronic Foley placement,bilateral nephrostomies,high output ileostomy requiring weekly IV fluid support, long-term PICC line in her right upper extremity,diabetes mellitus, hyperlipidemia, GERD, hypothyroidism, CKD 4 baseline creatinine of close to 1.4, iron deficiency anemia, neutropenia, who presented with fever chills. Pt hasbilateralnephrotomies placed in 09/2017, whichwere exchanged by interventional radiology on 11/28/2017.Shedeveloped feverof 103.2and chills overnight following the procedure.   Hospital Course:   Sepsis due to acute pyelonephritis -Following  recent nephrostomy tube exchange on 4/29 in interventional radiology -Urine culture grew 100,000 colonies of Klebsiella -Clinically improving, stopped IV fluids -Blood cultures are negative -based on sensitivities, changed to PO Cipro at discharge  Acute on chronic anemia -Baseline hemoglobin around 9, she is intermittently transfusion dependent due to anemia of chronic disease -Hemoglobin down to 7.3, no overt bleeding, this is dilutional as she is 4 L positive -repeat Hb was 7.4 and hence given 1 unit PRBC today, she is adamant about DC today due to family commitments and will get repeat CBC checked in 1 week  Hypothyroidism: Last TSH was2.408 on 08/30/17 -Continue home Synthroid  Type II diabetes mellitus with renal manifestations (HCC):Last A1c7.9 on 08/14/17,notwell controled. Patient is taking lantusJanuvia and NovoLogat home -continue low-dose Lantus and sliding-scale insulin  History of ovarian & endometrial cancer:s/p ofchemotherapy and XRT with TAH and BSO.  -f/u with Dr. Alvy Bimler and Denman [name]  1.7 cm left adrenal gland nodule,  -unchanged from prior imaging -follow-up with oncology  High-output ileostomy and vesicular rectal fistula, bilateral nephrostomy tubes -Being followed by interventional radiology, urology Dr. Tresa Moore and general surgeon Dr. 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 Baseline creatinine 1.2-1.5. Her creatinine was 1.48 on admission -improved, stopped IV fluids   Decubitus ulcer of sacral region, stage 2: -wound care consulted, recommendations given  HLD  (hyperlipidemia): -continue crestor  GERD: -Pepcid  Neutropenia: WBC 3.6. Patient is getting Neupogen injection weekly. Last dose was on lastSaturday.  Next dose is on Friday -FU with Dr.Gorsuch    Discharge Exam: Vitals:   12/01/17 1155 12/01/17 1459  BP: (!) 117/55 130/74  Pulse: 66 69  Resp: 16 16  Temp: 98.4 F (36.9 C) 98.2 F (36.8 C)  SpO2: 100% 100%    General: AAOx3 Cardiovascular: S1S2/RRR Respiratory: CTAB  Discharge Instructions   Discharge Instructions    Diet general   Complete by:  As directed    Increase activity slowly   Complete by:  As directed      Allergies as of 12/01/2017      Reactions   Penicillins Swelling   Facial swelling/childhood allergy Has patient had a PCN reaction causing immediate rash, facial/tongue/throat swelling, SOB or lightheadedness with hypotension: Yes Has patient had a PCN reaction causing severe rash involving mucus membranes or skin necrosis: Yes Has patient had a PCN reaction that required hospitalization yes Has patient had a PCN reaction occurring within the last 10 years: No If all of the above answers are "NO", then may proceed with Cephalosporin use.   Adhesive [tape]    blisters   Cefaclor Rash   Ceclor   Erythromycin Other (See Comments)   Gastritis, abd cramps   Trimethoprim Rash   Ultram [tramadol] Hives   Fluconazole Rash   Oxycodone Other (See Comments)   " I just feel weird"   Pectin Rash   Pectin ring for stoma   Septra [sulfamethoxazole-trimethoprim] Rash   Sulfa Antibiotics Rash      Medication List    STOP taking these medications   famotidine 20 MG tablet Commonly known as:  PEPCID     TAKE these medications   acetaminophen 325 MG tablet Commonly known as:  TYLENOL Take 2 tablets (650 mg total) by mouth every 6 (six) hours as needed. What changed:  reasons to take this   anastrozole 1 MG tablet Commonly known as:  ARIMIDEX Take 1 tablet (1 mg total) by mouth daily.    Biotin 5 MG Tabs Take 5 mg by mouth every morning.   ciprofloxacin 500 MG tablet Commonly known as:  CIPRO Take 1 tablet (500 mg total) by mouth 2 (two) times daily. For 8days   diphenhydrAMINE 25 mg capsule Commonly known as:  BENADRYL Take 1 capsule (25 mg total) by mouth every 8 (eight) hours as needed for itching, allergies or sleep.   diphenoxylate-atropine 2.5-0.025 MG tablet Commonly known as:  LOMOTIL Take 1 tablet by mouth 4 (four) times daily as needed for diarrhea or loose stools. TO PREVENT LOOSE BOWEL MOVEMENTS What changed:    when to take this  additional instructions   fentaNYL 50 MCG/HR Commonly known as:  DURAGESIC Place 1 patch (50 mcg total) onto the skin every 3 (three) days.   ferrous sulfate 325 (65 FE) MG tablet Take 1 tablet (325 mg total) by mouth at bedtime.   filgrastim 480 MCG/1.6ML injection Commonly known as:  NEUPOGEN Inject 480 mcg/1.6 ml under the skin every 6 days for life (last dose 09/22/17)   heparin lock flush 100 UNIT/ML Soln injection Inject 500 Units into the vein daily.   insulin aspart 100 UNIT/ML injection Commonly known as:  novoLOG Inject 7 Units into the skin 3 (three) times daily with meals. What changed:  how much to take   insulin glargine 100 UNIT/ML injection Commonly known as:  LANTUS Inject 14 Units into the skin 2 (two) times daily.   levothyroxine 150 MCG tablet Commonly known as:  SYNTHROID, LEVOTHROID  Take 1 tablet (150 mcg total) by mouth daily before breakfast.   lip balm ointment Apply 1 application topically 2 (two) times daily. What changed:    when to take this  reasons to take this   loperamide 2 MG capsule Commonly known as:  IMODIUM Take 1-2 capsules (2-4 mg total) by mouth every 8 (eight) hours as needed for diarrhea or loose stools (Use if >2 BM every 8 hours).   loratadine 10 MG tablet Commonly known as:  CLARITIN Take 10 mg by mouth every morning.   MAGNESIUM SULFATE IV Inject 2 g  into the vein every Wednesday. Given in 1L 0.9% NS by [facility]   methocarbamol 750 MG tablet Commonly known as:  ROBAXIN Take 1 tablet (750 mg total) by mouth 3 (three) times daily. What changed:    when to take this  reasons to take this   omega-3 acid ethyl esters 1 g capsule Commonly known as:  LOVAZA Take 1 g by mouth 2 (two) times daily.   ondansetron 4 MG disintegrating tablet Commonly known as:  ZOFRAN-ODT Take 4 mg by mouth every 6 (six) hours as needed for nausea or vomiting.   pregabalin 50 MG capsule Commonly known as:  LYRICA Take 1 capsule (50 mg total) by mouth 2 (two) times daily.   PRENATAL VITAMIN PO Take 1 capsule by mouth daily. Takes prenatal because there are no dyes in it   prochlorperazine 10 MG tablet Commonly known as:  COMPAZINE Take 1 tablet (10 mg total) by mouth 2 (two) times daily as needed for nausea or vomiting.   promethazine 25 MG tablet Commonly known as:  PHENERGAN Take 1 tablet (25 mg total) by mouth every 6 (six) hours as needed for nausea or vomiting. What changed:  when to take this   protein supplement shake Liqd Commonly known as:  PREMIER PROTEIN Take 325 mLs (11 oz total) by mouth 2 (two) times daily as needed (Pt to request).   psyllium 95 % Pack Commonly known as:  HYDROCIL/METAMUCIL Take 1 packet by mouth 2 (two) times daily. What changed:    when to take this  reasons to take this   ranitidine 150 MG tablet Commonly known as:  ZANTAC Take 1 tablet (150 mg total) by mouth 2 (two) times daily as needed for heartburn. What changed:  when to take this   rosuvastatin 10 MG tablet Commonly known as:  CRESTOR Take 10 mg by mouth every evening.   SANTYL ointment Generic drug:  collagenase Apply 1 application topically daily as needed (skin care).   sitaGLIPtin 100 MG tablet Commonly known as:  JANUVIA Take 50 mg by mouth every morning.   sodium chloride 0.9 % 1,000 mL Inject 1,000 mLs into the vein once a week. On  Wednesday with magnesium   sodium chloride 0.9 % injection Inject 10 mLs into the vein daily.   SYSTANE OP Place 1 drop into both eyes daily as needed (dry eyes).   Tbo-Filgrastim 480 MCG/0.8ML Sosy injection Commonly known as:  GRANIX Inject 0.8 mLs (480 mcg total) into the skin daily.   Vitamin D3 10000 units capsule Take 10,000 Units by mouth once a week. Sunday evening's      Allergies  Allergen Reactions  . Penicillins Swelling    Facial swelling/childhood allergy Has patient had a PCN reaction causing immediate rash, facial/tongue/throat swelling, SOB or lightheadedness with hypotension: Yes Has patient had a PCN reaction causing severe rash involving mucus membranes or skin necrosis:  Yes Has patient had a PCN reaction that required hospitalization yes Has patient had a PCN reaction occurring within the last 10 years: No If all of the above answers are "NO", then may proceed with Cephalosporin use.   . Adhesive [Tape]     blisters  . Cefaclor Rash    Ceclor  . Erythromycin Other (See Comments)    Gastritis, abd cramps  . Trimethoprim Rash  . Ultram [Tramadol] Hives  . Fluconazole Rash  . Oxycodone Other (See Comments)    " I just feel weird"  . Pectin Rash    Pectin ring for stoma  . Septra [Sulfamethoxazole-Trimethoprim] Rash  . Sulfa Antibiotics Rash   Follow-up Information    Ann Held, DO. Schedule an appointment as soon as possible for a visit in 1 week(s).   Specialty:  Family Medicine Why:  please check CBC and Bmet at [facility] information: [address] [place] [address] [phone]        [facility] [place] Follow up.   Specialty:  [place] Why:  They will call you Contact information: [address] [place] [address] [phone]            The results of significant diagnostics from this hospitalization (including imaging, microbiology, ancillary and laboratory)  are listed below for reference.    Significant Diagnostic Studies: Ct Abdomen Pelvis W Contrast  Result Date: 11/29/2017 CLINICAL DATA:  Bladder pressure, fever and chills. Blood in the [place] me back after tube placement. 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 EXAM: CT ABDOMEN AND PELVIS WITH CONTRAST TECHNIQUE: Multidetector CT imaging of the abdomen and pelvis was performed using the standard protocol following bolus administration of intravenous contrast. CONTRAST:  61mL OMNIPAQUE IOHEXOL 300 MG/ML  SOLN COMPARISON:  10/02/2017 FINDINGS: Lower chest: Bilateral breast implants.  Lung bases are clear. Hepatobiliary: No focal liver abnormality is seen. Status post cholecystectomy. No biliary dilatation. Pancreas: Unremarkable. No pancreatic ductal dilatation or surrounding inflammatory changes. Spleen: Normal in size without focal abnormality. Adrenals/Urinary Tract: Left adrenal gland nodule measuring 1.7 cm. Indeterminate. No change since prior study. Bilateral percutaneous nephrostomy tubes. Pigtails reside in the renal collecting systems bilaterally. No hydronephrosis. Gas in the renal collecting systems is likely related to catheter insertion. Hazy infiltration in the renal hila bilaterally may represent edema, inflammatory change, or hematoma. Bladder is decompressed with a Foley catheter. Stomach/Bowel: Partial colectomy with right upper quadrant colostomy. Stomach, small bowel, and colon are not abnormally distended. A loop of bowel in the pelvis, probably small-bowel, demonstrates thickened wall suggesting enteritis or bowel wall edema. If there has been a history of pelvic radiation therapy this could be postradiation change. Vascular/Lymphatic: Normal caliber abdominal aorta. Scattered lymph nodes in the retroperitoneum are prominent but not pathologically enlarged. No change since prior study. Reproductive: Surgical absence of the uterus. No abnormal pelvic masses.  Other: No free air in the abdomen. Small amount of free fluid in the pelvis is similar to prior study. There is prominent residual presacral soft tissue with surgical clips present. This is similar to prior study and may represent postoperative scarring. Follow up on subsequent imaging is recommended to exclude development of residual or recurrent tumor. Musculoskeletal: No destructive bone lesions. IMPRESSION: 1. Bilateral percutaneous nephrostomy tubes with decompression of the renal collecting systems. Hazy infiltration in the renal hila bilaterally could represent edema, inflammatory change, or hematoma. 2. Pelvic small bowel demonstrates thickened wall  suggesting enteritis or edema. Possible radiation change. 3. Small amount of free fluid in the pelvis is nonspecific but likely reactive. No change since prior study. 4. Prominent presacral soft tissues and area of postoperative change. This could represent postoperative scarring. Attention on follow-up imaging to exclude residual or recurrent tumor. No change since prior study. 5. 1.7 cm left adrenal gland nodule is unchanged since prior study. Electronically Signed   By: Lucienne Capers M.D.   On: 11/29/2017 04:27   Dg Chest Port 1 View  Result Date: 11/29/2017 CLINICAL DATA:  Fever EXAM: PORTABLE CHEST 1 VIEW COMPARISON:  10/03/2017 FINDINGS: The heart size and mediastinal contours are within normal limits. Both lungs are clear. The visualized skeletal structures are unremarkable. Right PICC line with tip over the mid SVC region. No pneumothorax. Surgical clips in the left axilla. Probable drainage catheters projected over the upper abdomen, incompletely visualized. IMPRESSION: No active disease. Electronically Signed   By: Lucienne Capers M.D.   On: 11/29/2017 03:11   Ir Nephrostogram Left Thru Existing Access  Result Date: 11/29/2017 INDICATION: Sepsis. Patient had bilateral nephrostomy catheter exchange yesterday without apparent complication.  EXAM: BILATERAL ANTEGRADE NEPHROSTOGRAM THROUGH EXISTING CATHETER COMPARISON:  Previous day's exam MEDICATIONS: None indicated ANESTHESIA/SEDATION: None required CONTRAST:  15 mL Isovue-administered into the collecting system(s) FLUOROSCOPY TIME:  36 seconds; 5 mGy COMPLICATIONS: None immediate. PROCEDURE: Informed written consent was obtained from the patient after a thorough discussion of the procedural risks, benefits and alternatives. All questions were addressed. Contrast was sequentially injected under fluoroscopy through the right and left nephrostomy catheters. Both catheters remain well positioned centrally within the renal collecting systems. No hydronephrosis. No filling defects. Proximal ureters decompressed. IMPRESSION: 1. Bilateral nephrostomy catheters are well position and patent. Electronically Signed   By: Lucrezia Europe M.D.   On: 11/29/2017 09:46   Ir Nephrostogram Right Thru Existing Access  Result Date: 11/29/2017 INDICATION: Sepsis. Patient had bilateral nephrostomy catheter exchange yesterday without apparent complication. EXAM: BILATERAL ANTEGRADE NEPHROSTOGRAM THROUGH EXISTING CATHETER COMPARISON:  Previous day's exam MEDICATIONS: None indicated ANESTHESIA/SEDATION: None required CONTRAST:  15 mL Isovue-administered into the collecting system(s) FLUOROSCOPY TIME:  36 seconds; 5 mGy COMPLICATIONS: None immediate. PROCEDURE: Informed written consent was obtained from the patient after a thorough discussion of the procedural risks, benefits and alternatives. All questions were addressed. Contrast was sequentially injected under fluoroscopy through the right and left nephrostomy catheters. Both catheters remain well positioned centrally within the renal collecting systems. No hydronephrosis. No filling defects. Proximal ureters decompressed. IMPRESSION: 1. Bilateral nephrostomy catheters are well position and patent. Electronically Signed   By: Lucrezia Europe M.D.   On: 11/29/2017 09:46   Ir  Nephrostomy Exchange Left  Result Date: 11/28/2017 INDICATION: 62 year old female with a history of endometrial cancer complicated by bilateral ureteral obstruction. Bilateral percutaneous nephrostomy tubes were placed on 09/02/2017. She presents today for routine exchange. Of note, she has had significant difficulties with her right-sided percutaneous nephrostomy tube which has been painful since the time of placement. EXAM: Bilateral nephrostomy tube exchange COMPARISON:  None. MEDICATIONS: None ANESTHESIA/SEDATION: None CONTRAST:  10 mL Omnipaque 300-administered into the collecting system(s) FLUOROSCOPY TIME:  Fluoroscopy Time: 3 minutes 12 seconds (58.6 mGy). COMPLICATIONS: None immediate. PROCEDURE: Informed written consent was obtained from the patient after a thorough discussion of the procedural risks, benefits and alternatives. All questions were addressed. Maximal Sterile Barrier Technique was utilized including caps, mask, sterile gowns, sterile gloves, sterile drape, hand hygiene and skin antiseptic. A timeout was performed  prior to the initiation of the procedure. Gentle hand injection of contrast material through the left percutaneous nephrostomy tube demonstrates the tube enters via an interpolar calyx and is well formed in the renal pelvis. The renal pelvis is relatively small with respect to the tube. The tube was cut and removed over a short Amplatz wire. A new Cook 12 French percutaneous nephrostomy tube was advanced over the wire and formed. The tube was secured to the skin with 0 Prolene suture and an adhesive fixation device. Images were obtained and stored for the medical record. Attention was turned to the left tube. A gentle hand injection of contrast material was again performed. The tube enters via and upper pole calyx. The tube is large relative to the size of the collecting system. The tube was cut and removed over a short Amplatz wire. An attempt was made to replace with another 12  percutaneous nephrostomy tube, however the tube was difficult to form in the relatively small bifid renal pelvis. The patient expressed discomfort. Therefore, the tube was discarded and a 12 French Bettey Mare drainage catheter was advanced over the wire and formed in the renal pelvis. This catheter was then secured to the skin with 0 Prolene suture and an adhesive fixation device. The patient tolerated the procedure relatively well. IMPRESSION: 1. Exchange for a left 12 French percutaneous nephrostomy tube. 2. Exchange for a right 12 French Bettey Mare nephrostomy tube due to small renal pelvis size and patient discomfort. Signed, Criselda Peaches, MD [facility] Radiology Electronically Signed   By: Jacqulynn Cadet M.D.   On: 11/28/2017 15:56   Ir Nephrostomy Exchange Right  Result Date: 11/28/2017 INDICATION: 62 year old female with a history of endometrial cancer complicated by bilateral ureteral obstruction. Bilateral percutaneous nephrostomy tubes were placed on 09/02/2017. She presents today for routine exchange. Of note, she has had significant difficulties with her right-sided percutaneous nephrostomy tube which has been painful since the time of placement. EXAM: Bilateral nephrostomy tube exchange COMPARISON:  None. MEDICATIONS: None ANESTHESIA/SEDATION: None CONTRAST:  10 mL Omnipaque 300-administered into the collecting system(s) FLUOROSCOPY TIME:  Fluoroscopy Time: 3 minutes 12 seconds (58.6 mGy). COMPLICATIONS: None immediate. PROCEDURE: Informed written consent was obtained from the patient after a thorough discussion of the procedural risks, benefits and alternatives. All questions were addressed. Maximal Sterile Barrier Technique was utilized including caps, mask, sterile gowns, sterile gloves, sterile drape, hand hygiene and skin antiseptic. A timeout was performed prior to the initiation of the procedure. Gentle hand injection of  contrast material through the left percutaneous nephrostomy tube demonstrates the tube enters via an interpolar calyx and is well formed in the renal pelvis. The renal pelvis is relatively small with respect to the tube. The tube was cut and removed over a short Amplatz wire. A new Cook 12 French percutaneous nephrostomy tube was advanced over the wire and formed. The tube was secured to the skin with 0 Prolene suture and an adhesive fixation device. Images were obtained and stored for the medical record. Attention was turned to the left tube. A gentle hand injection of contrast material was again performed. The tube enters via and upper pole calyx. The tube is large relative to the size of the collecting system. The tube was cut and removed over a short Amplatz wire. An attempt was made to replace with another 12 percutaneous nephrostomy tube, however the tube was difficult to form in the relatively small bifid renal pelvis. The patient expressed  discomfort. Therefore, the tube was discarded and a 12 French Bettey Mare drainage catheter was advanced over the wire and formed in the renal pelvis. This catheter was then secured to the skin with 0 Prolene suture and an adhesive fixation device. The patient tolerated the procedure relatively well. IMPRESSION: 1. Exchange for a left 12 French percutaneous nephrostomy tube. 2. Exchange for a right 12 French Bettey Mare nephrostomy tube due to small renal pelvis size and patient discomfort. Signed, Criselda Peaches, MD [facility] Radiology Electronically Signed   By: Jacqulynn Cadet M.D.   On: 11/28/2017 15:56    Microbiology: Recent Results (from the past 240 hour(s))  Culture, blood (Routine x 2)     Status: None (Preliminary result)   Collection Time: 11/29/17  1:30 AM  Result Value Ref Range Status   Specimen Description BLOOD LEFT ARM  Final   Special Requests   Final    BOTTLES DRAWN AEROBIC AND  ANAEROBIC Blood Culture results may not be optimal due to an excessive volume of blood received in culture bottles   Culture   Final    NO GROWTH 2 DAYS Performed at [facility], [place] [address], [place], [place] [address]    Report Status PENDING  Incomplete  Culture, blood (Routine x 2)     Status: None (Preliminary result)   Collection Time: 11/29/17  1:40 AM  Result Value Ref Range Status   Specimen Description BLOOD LEFT HAND  Final   Special Requests   Final    AEROBIC BOTTLE ONLY Blood Culture results may not be optimal due to an excessive volume of blood received in culture bottles   Culture   Final    NO GROWTH 2 DAYS Performed at [facility], [place] [address], [place], [place] [address]    Report Status PENDING  Incomplete  Urine culture     Status: Abnormal (Preliminary result)   Collection Time: 11/29/17  1:57 AM  Result Value Ref Range Status   Specimen Description URINE, RANDOM  Final   Special Requests   Final    NONE Performed at [facility], [place] [address], [place], [place] [address]    Culture (A)  Final    >=100,000 COLONIES/mL KLEBSIELLA PNEUMONIAE 60,000 COLONIES/mL SERRATIA MARCESCENS    Report Status PENDING  Incomplete   Organism ID, Bacteria KLEBSIELLA PNEUMONIAE (A)  Final      Susceptibility   Klebsiella pneumoniae - MIC*    AMPICILLIN >=32 RESISTANT Resistant     CEFAZOLIN <=4 SENSITIVE Sensitive     CEFTRIAXONE <=1 SENSITIVE Sensitive     CIPROFLOXACIN <=0.25 SENSITIVE Sensitive     GENTAMICIN <=1 SENSITIVE Sensitive     IMIPENEM <=0.25 SENSITIVE Sensitive     NITROFURANTOIN 128 RESISTANT Resistant     TRIMETH/SULFA <=20 SENSITIVE Sensitive     AMPICILLIN/SULBACTAM 16 INTERMEDIATE Intermediate     PIP/TAZO 16 SENSITIVE Sensitive     Extended ESBL NEGATIVE Sensitive     * >=100,000 COLONIES/mL KLEBSIELLA PNEUMONIAE  MRSA PCR Screening     Status: None   Collection Time: 11/29/17  6:07 PM  Result Value Ref Range Status    MRSA by PCR NEGATIVE NEGATIVE Final    Comment:        The GeneXpert MRSA Assay (FDA approved for NASAL specimens only), is one component of a comprehensive MRSA colonization surveillance program. It is not intended to diagnose MRSA infection nor to guide  or monitor treatment for MRSA infections. Performed at [facility], [place] [address], [place], [place] [address]      Labs: Basic Metabolic Panel: Recent Labs  Lab 11/29/17 0156 11/30/17 0457 12/01/17 0406  NA 138 139 140  K 3.7 3.4* 3.6  CL 108 108 112*  CO2 22 22 22   GLUCOSE 165* 94 107*  BUN 21* 13 13  CREATININE 1.48* 1.31* 1.38*  CALCIUM 8.5* 8.4* 8.5*  MG  --  1.1*  --    Liver Function Tests: Recent Labs  Lab 11/29/17 0156  AST 16  ALT 25  ALKPHOS 193*  BILITOT 0.3  PROT 6.6  ALBUMIN 2.7*   No results for input(s): LIPASE, AMYLASE in the last 168 hours. No results for input(s): AMMONIA in the last 168 hours. CBC: Recent Labs  Lab 11/29/17 0156 11/30/17 0457 12/01/17 0406  WBC 3.6* 2.3* 2.6*  NEUTROABS 2.1  --   --   HGB 8.7* 7.3* 7.4*  HCT 28.4* 23.9* 24.1*  MCV 96.3 96.8 95.6  PLT 314 223 254   Cardiac Enzymes: No results for input(s): CKTOTAL, CKMB, CKMBINDEX, TROPONINI in the last 168 hours. BNP: BNP (last 3 results) No results for input(s): BNP in the last 8760 hours.  ProBNP (last 3 results) No results for input(s): PROBNP in the last 8760 hours.  CBG: Recent Labs  Lab 11/30/17 1640 11/30/17 2110 12/01/17 0823 12/01/17 0900 12/01/17 1157  GLUCAP 101* 146* 64* 90 107*       Signed:  Domenic Polite MD.  [facility] 12/01/2017, 4:41 PM

## 2017-12-01 NOTE — Progress Notes (Signed)
Patient discharged to home, AVS reviewed with patient and family. Prescriptions provided. Patient planned to make her own follow-up appointments. PICC left in place at discharge. Telebox returned. Patient left floor via wheelchair with staff member

## 2017-12-02 ENCOUNTER — Telehealth: Payer: Self-pay | Admitting: *Deleted

## 2017-12-02 ENCOUNTER — Other Ambulatory Visit: Payer: Self-pay | Admitting: *Deleted

## 2017-12-02 ENCOUNTER — Telehealth: Payer: Self-pay

## 2017-12-02 LAB — TYPE AND SCREEN
ABO/RH(D): A POS
Antibody Screen: NEGATIVE
Unit division: 0

## 2017-12-02 LAB — BPAM RBC
BLOOD PRODUCT EXPIRATION DATE: 201905092359
ISSUE DATE / TIME: 201905021120
UNIT TYPE AND RH: 600

## 2017-12-02 NOTE — Telephone Encounter (Signed)
This triage nurse paged to lobby thought to be for schedule II prescription pick-up.  Spouse "explained situation, wife is neutropenic, requires Neupogen injections to manage, now the insurance decided she must be on generic Granix.  She injects every six days.  Prescription written for thirty caused another insurance denial because she only needs six each month.  THIS NEEDS TO BE AUTHORIZED TODAY.  She runs out Wednesday and  It takes a week for shipment."  Collaborative nurse arrived, advised [name] to wait in lobby, his request to be managed further with new Granix order.  No further assistance needed from this triage nurse at this time.

## 2017-12-02 NOTE — Telephone Encounter (Signed)
Pt's husband left a VM in regards to patient waiting on the prior auth to be completed on Granix prescription.  I was working on the prior [place] and pt's husband came up to the cancer center to inquire about prior auth as his wife needs medication soon.  Mateo Flow RN went out to speak to him and he reported patient needs injection by next Wednesday.  Notified Dr Alvy Bimler and Mateo Flow RN spoke back with pt's husband and let him knowing we are working on the prior auth for the Granix and if not shipped by Tuesday, then Dr Alvy Bimler said pt can receive injection here at cancer center.    Spent over an hour on hold and speaking with [facility] pharmacy - their pharmacist and Prior [place] phone number [phone].  They have faxed the prior auth form to our office but Dr Alvy Bimler has left for the day and we will get her to complete on Monday so it can be faxed then.  I clarified with Dr Alvy Bimler the prescription for Granix should read administered every 5 days not daily as the prescription reads in our system, pharmacist at [facility] has changed it.  I will report this prior autho information to Dr Calton Dach nurse on Monday so the fax will be sent so pt can receive her medication in a timely manner or contact her and husband to come to cancer center for the injection.

## 2017-12-02 NOTE — Telephone Encounter (Signed)
This RN spoke with the patient's husband in lobby of [facility] per his concern that pt is due to her neupogen " but we do not have it from the pharmacy "  Noted prescription written 11/28/2017 and sent to [facility] - 2 nd is calling mail order pharmacy to inquire regarding status of medication.  Per discussion with Mr Hawkey - pt is not actually due for the injection until 5/8.  Reviewed medication ordered is the same as neupogen but made by different manufacturer- as well as current call being done with pharmacy to follow up.  Per discussion Mr Newbrough understands plan is this office will follow up with mail order for clarification for shipping. If the medication has not arrived by 12/07/2017 - Mr Blasing will call so pt can obtain her every 6 day injection on 12/07/2017 at [facility].  Mr Mersch will await call from this office per communication with [facility].  Mr Ngo stated appreciation of above with no further needs at this time.

## 2017-12-02 NOTE — Telephone Encounter (Signed)
Received Home Health Certification and Plan of Care; forwarded to provider/SLS 05/03

## 2017-12-03 LAB — URINE CULTURE

## 2017-12-04 LAB — CULTURE, BLOOD (ROUTINE X 2)
CULTURE: NO GROWTH
Culture: NO GROWTH

## 2017-12-05 ENCOUNTER — Telehealth: Payer: Self-pay | Admitting: *Deleted

## 2017-12-05 ENCOUNTER — Encounter: Payer: Self-pay | Admitting: Hematology and Oncology

## 2017-12-05 NOTE — Telephone Encounter (Signed)
Transition Care Management Follow-up Telephone Call   Date discharged?12/01/17   How have you been since you were released from the hospital? Doing well   Do you understand why you were in the hospital? yes   Do you understand the discharge instructions? yes   Where were you discharged to? 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  Allergies reviewed: yes  Dietary changes reviewed: yes  Referrals reviewed: yes   Functional Questionnaire:   Activities of Daily Living (ADLs):   She states they are independent in the following: ambulation, bathing and hygiene, feeding, continence, grooming, toileting and dressing States they require assistance with the following: na   Any transportation issues/concerns?: no   Any patient concerns? no   Confirmed importance and date/time of follow-up visits scheduled yes  Pt states she is following up with Dr.Upton and will call to schedule appt with Dr.Lowne if that changes. Pt states she also has home health nurse coming frequently.  Confirmed with patient if condition begins to worsen call PCP or go to the ER.  Patient was given the office number and encouraged to call back with question or concerns.  : yes

## 2017-12-05 NOTE — Telephone Encounter (Signed)
"  Calling for my wife, this morning [facility] received the information,  Granix request has been denied.  Please file an urgent appeal.  Call me [phone] or contact pharmacy for case number: [id number].  To reach [facility], call [phone]."

## 2017-12-05 NOTE — Progress Notes (Signed)
Faxed prior auth appeal paperwork for Granix and letter of medical necessity from Dr. Alvy Bimler to [facility] at [phone].

## 2017-12-05 NOTE — Telephone Encounter (Signed)
Faxed completed Prior Authorization request to [facility].  Pt notified that completed form had been sent

## 2017-12-05 NOTE — Telephone Encounter (Signed)
"  Heather RN with [place] to speak with whomever is responsible for appeals in the office.  Are you aware the Granix has been denied?  Need to make sure office is aware, ensure fax was received with recommendations for an appeal."  Observed documentation note in reference to prour authorization appeal and letter of medical necessity from provider.  Nira Conn denies further needs, questions or desire to speak with anyone else with information provided at this time.

## 2017-12-05 NOTE — Telephone Encounter (Signed)
PLs follow on this

## 2017-12-05 NOTE — Telephone Encounter (Signed)
Received Home Health Approval for Skilled Nursing via [employer]/SLS 05/06

## 2017-12-07 DIAGNOSIS — T82594D Other mechanical complication of infusion catheter, subsequent encounter: Secondary | ICD-10-CM | POA: Diagnosis not present

## 2017-12-07 DIAGNOSIS — Z452 Encounter for adjustment and management of vascular access device: Secondary | ICD-10-CM | POA: Diagnosis not present

## 2017-12-07 DIAGNOSIS — N179 Acute kidney failure, unspecified: Secondary | ICD-10-CM | POA: Diagnosis not present

## 2017-12-07 DIAGNOSIS — Z5181 Encounter for therapeutic drug level monitoring: Secondary | ICD-10-CM | POA: Diagnosis not present

## 2017-12-08 ENCOUNTER — Telehealth: Payer: Self-pay

## 2017-12-08 ENCOUNTER — Other Ambulatory Visit: Payer: Self-pay | Admitting: Hematology and Oncology

## 2017-12-08 ENCOUNTER — Inpatient Hospital Stay: Payer: BLUE CROSS/BLUE SHIELD | Attending: Hematology and Oncology

## 2017-12-08 VITALS — BP 141/66 | HR 70 | Temp 98.1°F | Resp 16

## 2017-12-08 DIAGNOSIS — C50212 Malignant neoplasm of upper-inner quadrant of left female breast: Secondary | ICD-10-CM | POA: Insufficient documentation

## 2017-12-08 DIAGNOSIS — D709 Neutropenia, unspecified: Secondary | ICD-10-CM | POA: Insufficient documentation

## 2017-12-08 DIAGNOSIS — D61818 Other pancytopenia: Secondary | ICD-10-CM

## 2017-12-08 MED ORDER — TBO-FILGRASTIM 480 MCG/0.8ML ~~LOC~~ SOSY
PREFILLED_SYRINGE | SUBCUTANEOUS | Status: AC
  Filled 2017-12-08: qty 0.8

## 2017-12-08 MED ORDER — TBO-FILGRASTIM 480 MCG/0.8ML ~~LOC~~ SOSY
480.0000 ug | PREFILLED_SYRINGE | Freq: Once | SUBCUTANEOUS | Status: AC
  Administered 2017-12-08: 480 ug via SUBCUTANEOUS

## 2017-12-08 NOTE — Telephone Encounter (Signed)
Patient in waiting room crying. She needs her Neupogen injection, she usually gets the injection at home. Her insurance company is waiting on authorization. Per Dr. Alvy Bimler sent scheduling message for injection today and Monday.

## 2017-12-09 ENCOUNTER — Telehealth: Payer: Self-pay | Admitting: Hematology and Oncology

## 2017-12-09 DIAGNOSIS — L89319 Pressure ulcer of right buttock, unspecified stage: Secondary | ICD-10-CM | POA: Diagnosis not present

## 2017-12-09 DIAGNOSIS — Z933 Colostomy status: Secondary | ICD-10-CM | POA: Diagnosis not present

## 2017-12-09 NOTE — Telephone Encounter (Signed)
Scheduled appt per 5/9 sch msg - spoke w/ pt re appts.

## 2017-12-09 NOTE — Telephone Encounter (Signed)
Spoke to [name] about her Prior Authorization denial for [employer]. She was very upset because she needs this medication. I called [facility] at [phone], case # [id number], and spoke to [place] about an appeal. Verbal prior Josem Kaufmann process was done, and Granix 480 mcg has been approved until 01/08/18. After this time, patient can request to go back to her prior medication, Neupogen, if needed. Informed patient [name] has been approved and she will call back with any further questions or concerns.

## 2017-12-12 ENCOUNTER — Ambulatory Visit: Payer: Self-pay

## 2017-12-12 DIAGNOSIS — L89309 Pressure ulcer of unspecified buttock, unspecified stage: Secondary | ICD-10-CM | POA: Diagnosis not present

## 2017-12-12 DIAGNOSIS — E785 Hyperlipidemia, unspecified: Secondary | ICD-10-CM | POA: Diagnosis not present

## 2017-12-12 DIAGNOSIS — E860 Dehydration: Secondary | ICD-10-CM | POA: Diagnosis not present

## 2017-12-14 ENCOUNTER — Inpatient Hospital Stay: Payer: BLUE CROSS/BLUE SHIELD

## 2017-12-14 DIAGNOSIS — Z923 Personal history of irradiation: Secondary | ICD-10-CM | POA: Diagnosis not present

## 2017-12-14 DIAGNOSIS — Z932 Ileostomy status: Secondary | ICD-10-CM | POA: Diagnosis not present

## 2017-12-14 DIAGNOSIS — Z8542 Personal history of malignant neoplasm of other parts of uterus: Secondary | ICD-10-CM | POA: Diagnosis not present

## 2017-12-14 DIAGNOSIS — N135 Crossing vessel and stricture of ureter without hydronephrosis: Secondary | ICD-10-CM | POA: Diagnosis not present

## 2017-12-14 DIAGNOSIS — Z8543 Personal history of malignant neoplasm of ovary: Secondary | ICD-10-CM | POA: Diagnosis not present

## 2017-12-14 DIAGNOSIS — N321 Vesicointestinal fistula: Secondary | ICD-10-CM | POA: Diagnosis not present

## 2017-12-14 MED ORDER — TBO-FILGRASTIM 480 MCG/0.8ML ~~LOC~~ SOSY
PREFILLED_SYRINGE | SUBCUTANEOUS | Status: AC
  Filled 2017-12-14: qty 0.8

## 2017-12-15 DIAGNOSIS — T82594D Other mechanical complication of infusion catheter, subsequent encounter: Secondary | ICD-10-CM | POA: Diagnosis not present

## 2017-12-16 DIAGNOSIS — T82594D Other mechanical complication of infusion catheter, subsequent encounter: Secondary | ICD-10-CM | POA: Diagnosis not present

## 2017-12-19 DIAGNOSIS — T82594D Other mechanical complication of infusion catheter, subsequent encounter: Secondary | ICD-10-CM | POA: Diagnosis not present

## 2017-12-21 ENCOUNTER — Telehealth: Payer: Self-pay | Admitting: Family Medicine

## 2017-12-21 DIAGNOSIS — Z452 Encounter for adjustment and management of vascular access device: Secondary | ICD-10-CM | POA: Diagnosis not present

## 2017-12-21 DIAGNOSIS — N179 Acute kidney failure, unspecified: Secondary | ICD-10-CM | POA: Diagnosis not present

## 2017-12-21 DIAGNOSIS — Z5181 Encounter for therapeutic drug level monitoring: Secondary | ICD-10-CM | POA: Diagnosis not present

## 2017-12-21 DIAGNOSIS — T82594D Other mechanical complication of infusion catheter, subsequent encounter: Secondary | ICD-10-CM | POA: Diagnosis not present

## 2017-12-21 NOTE — Telephone Encounter (Signed)
Copied from [place] [id number]. Topic: Quick Communication - See Telephone Encounter >> Dec 21, 2017  2:21 PM Conception Chancy, NT wrote: CRM for notification. See Telephone encounter for: 12/21/17.  Cecille Rubin is calling from [facility] and states she is not sure if Dr. Etter Sjogren is willing to sign 719 form for recertification. States the patient is due for one. Please advise.  Cecille Rubin Cb# [phone]

## 2017-12-23 ENCOUNTER — Other Ambulatory Visit: Payer: Self-pay | Admitting: Family Medicine

## 2017-12-23 ENCOUNTER — Telehealth: Payer: Self-pay | Admitting: Family Medicine

## 2017-12-23 MED ORDER — FENTANYL 25 MCG/HR TD PT72: 25.0000 ug | MEDICATED_PATCH | TRANSDERMAL | 0 refills | Status: DC

## 2017-12-23 NOTE — Telephone Encounter (Signed)
Sent in

## 2017-12-23 NOTE — Telephone Encounter (Signed)
Please advise 

## 2017-12-23 NOTE — Telephone Encounter (Signed)
I can

## 2017-12-23 NOTE — Telephone Encounter (Signed)
Copied from [place] [id number]. Topic: Quick Communication - Rx Refill/Question >> Dec 23, 2017 11:11 AM Bea Graff, NT wrote: Medication: fentaNYL (SUBLIMAZE) injection 25 mcg  Has the patient contacted their pharmacy? Yes.   (Agent: If no, request that the patient contact the pharmacy for the refill.) (Agent: If yes, when and what did the pharmacy advise?)  Preferred Pharmacy (with phone number or street name): [facility] [id number] - [place], [place] AT [address] [phone] (Phone) [phone] (Fax)      Agent: Please be advised that RX refills may take up to 3 business days. We ask that you follow-up with your pharmacy.

## 2017-12-23 NOTE — Telephone Encounter (Signed)
Patient would like the 8mcg patch again  Database ran and is on your desk for review  Last filled per database: 11/22/17 Last written: 11/22/17 Last ov: 10/20/17 Next ov: none Contract: none UDS: none

## 2017-12-23 NOTE — Telephone Encounter (Signed)
LOV 10/20/17 Dr. Carollee Herter Fentanyl patch is not on pt.'s medication list, but she reports provider prescribes these for her. Would like to try to lower the dose from 50 mcg to 25 mcg.

## 2017-12-27 NOTE — Telephone Encounter (Signed)
[place] notified.  She will fill out paperwork and get it over to Dr. Etter Sjogren for signature.

## 2017-12-27 NOTE — Telephone Encounter (Signed)
Patient notified that rx was sent in  

## 2017-12-28 DIAGNOSIS — Z933 Colostomy status: Secondary | ICD-10-CM | POA: Diagnosis not present

## 2017-12-28 DIAGNOSIS — Z452 Encounter for adjustment and management of vascular access device: Secondary | ICD-10-CM | POA: Diagnosis not present

## 2017-12-28 DIAGNOSIS — Z5181 Encounter for therapeutic drug level monitoring: Secondary | ICD-10-CM | POA: Diagnosis not present

## 2017-12-28 DIAGNOSIS — E860 Dehydration: Secondary | ICD-10-CM | POA: Diagnosis not present

## 2017-12-28 DIAGNOSIS — N179 Acute kidney failure, unspecified: Secondary | ICD-10-CM | POA: Diagnosis not present

## 2017-12-28 DIAGNOSIS — T82594D Other mechanical complication of infusion catheter, subsequent encounter: Secondary | ICD-10-CM | POA: Diagnosis not present

## 2017-12-29 DIAGNOSIS — Z933 Colostomy status: Secondary | ICD-10-CM | POA: Diagnosis not present

## 2017-12-29 DIAGNOSIS — L89319 Pressure ulcer of right buttock, unspecified stage: Secondary | ICD-10-CM | POA: Diagnosis not present

## 2017-12-30 DIAGNOSIS — I10 Essential (primary) hypertension: Secondary | ICD-10-CM | POA: Diagnosis not present

## 2017-12-30 DIAGNOSIS — N183 Chronic kidney disease, stage 3 (moderate): Secondary | ICD-10-CM | POA: Diagnosis not present

## 2017-12-30 DIAGNOSIS — D631 Anemia in chronic kidney disease: Secondary | ICD-10-CM | POA: Diagnosis not present

## 2018-01-03 DIAGNOSIS — T82594D Other mechanical complication of infusion catheter, subsequent encounter: Secondary | ICD-10-CM | POA: Diagnosis not present

## 2018-01-03 DIAGNOSIS — L89309 Pressure ulcer of unspecified buttock, unspecified stage: Secondary | ICD-10-CM | POA: Diagnosis not present

## 2018-01-04 ENCOUNTER — Other Ambulatory Visit (HOSPITAL_COMMUNITY): Payer: Self-pay | Admitting: Interventional Radiology

## 2018-01-04 DIAGNOSIS — Z5181 Encounter for therapeutic drug level monitoring: Secondary | ICD-10-CM | POA: Diagnosis not present

## 2018-01-04 DIAGNOSIS — Z452 Encounter for adjustment and management of vascular access device: Secondary | ICD-10-CM | POA: Diagnosis not present

## 2018-01-04 DIAGNOSIS — R52 Pain, unspecified: Secondary | ICD-10-CM

## 2018-01-04 DIAGNOSIS — T82594D Other mechanical complication of infusion catheter, subsequent encounter: Secondary | ICD-10-CM | POA: Diagnosis not present

## 2018-01-04 DIAGNOSIS — N179 Acute kidney failure, unspecified: Secondary | ICD-10-CM | POA: Diagnosis not present

## 2018-01-05 ENCOUNTER — Ambulatory Visit (HOSPITAL_COMMUNITY)
Admission: RE | Admit: 2018-01-05 | Discharge: 2018-01-05 | Disposition: A | Discharge status: Home / Self Care | Payer: BLUE CROSS/BLUE SHIELD | Source: Ambulatory Visit | Attending: Interventional Radiology | Admitting: Interventional Radiology

## 2018-01-05 ENCOUNTER — Other Ambulatory Visit (HOSPITAL_COMMUNITY): Payer: Self-pay | Admitting: Interventional Radiology

## 2018-01-05 ENCOUNTER — Encounter (HOSPITAL_COMMUNITY): Payer: Self-pay | Admitting: Interventional Radiology

## 2018-01-05 DIAGNOSIS — Y733 Surgical instruments, materials and gastroenterology and urology devices (including sutures) associated with adverse incidents: Secondary | ICD-10-CM | POA: Insufficient documentation

## 2018-01-05 DIAGNOSIS — R52 Pain, unspecified: Secondary | ICD-10-CM

## 2018-01-05 DIAGNOSIS — Z8542 Personal history of malignant neoplasm of other parts of uterus: Secondary | ICD-10-CM | POA: Insufficient documentation

## 2018-01-05 DIAGNOSIS — T8384XA Pain from genitourinary prosthetic devices, implants and grafts, initial encounter: Secondary | ICD-10-CM | POA: Insufficient documentation

## 2018-01-05 DIAGNOSIS — N135 Crossing vessel and stricture of ureter without hydronephrosis: Secondary | ICD-10-CM | POA: Diagnosis not present

## 2018-01-05 DIAGNOSIS — N1339 Other hydronephrosis: Secondary | ICD-10-CM | POA: Insufficient documentation

## 2018-01-05 HISTORY — PX: IR NEPHROSTOMY EXCHANGE RIGHT: IMG6070

## 2018-01-05 HISTORY — PX: IR NEPHROSTOMY EXCHANGE LEFT: IMG6069

## 2018-01-05 MED ORDER — IOPAMIDOL (ISOVUE-300) INJECTION 61%
INTRAVENOUS | Status: AC
  Administered 2018-01-05: 15 mL
  Filled 2018-01-05: qty 50

## 2018-01-05 MED ORDER — LIDOCAINE HCL 1 % IJ SOLN
INTRAMUSCULAR | Status: AC
  Filled 2018-01-05: qty 20

## 2018-01-05 MED ORDER — IOPAMIDOL (ISOVUE-300) INJECTION 61%
50.0000 mL | Freq: Once | INTRAVENOUS | Status: AC | PRN
  Administered 2018-01-05: 15 mL

## 2018-01-05 NOTE — Procedures (Signed)
Pre Procedure Dx: Hydronephrosis Post Procedure Dx: Same  Successful bilateral PCN exchange.    EBL: None   No immediate complications.   Jay Rivan Siordia, MD Pager #: [phone]  

## 2018-01-09 DIAGNOSIS — Z933 Colostomy status: Secondary | ICD-10-CM | POA: Diagnosis not present

## 2018-01-10 DIAGNOSIS — E119 Type 2 diabetes mellitus without complications: Secondary | ICD-10-CM | POA: Diagnosis not present

## 2018-01-10 DIAGNOSIS — L89319 Pressure ulcer of right buttock, unspecified stage: Secondary | ICD-10-CM | POA: Diagnosis not present

## 2018-01-10 DIAGNOSIS — E782 Mixed hyperlipidemia: Secondary | ICD-10-CM | POA: Diagnosis not present

## 2018-01-10 DIAGNOSIS — E039 Hypothyroidism, unspecified: Secondary | ICD-10-CM | POA: Diagnosis not present

## 2018-01-10 DIAGNOSIS — Z933 Colostomy status: Secondary | ICD-10-CM | POA: Diagnosis not present

## 2018-01-11 DIAGNOSIS — E782 Mixed hyperlipidemia: Secondary | ICD-10-CM | POA: Diagnosis not present

## 2018-01-11 DIAGNOSIS — Z5181 Encounter for therapeutic drug level monitoring: Secondary | ICD-10-CM | POA: Diagnosis not present

## 2018-01-11 DIAGNOSIS — E039 Hypothyroidism, unspecified: Secondary | ICD-10-CM | POA: Diagnosis not present

## 2018-01-11 DIAGNOSIS — T82594D Other mechanical complication of infusion catheter, subsequent encounter: Secondary | ICD-10-CM | POA: Diagnosis not present

## 2018-01-11 DIAGNOSIS — E860 Dehydration: Secondary | ICD-10-CM | POA: Diagnosis not present

## 2018-01-11 DIAGNOSIS — I129 Hypertensive chronic kidney disease with stage 1 through stage 4 chronic kidney disease, or unspecified chronic kidney disease: Secondary | ICD-10-CM | POA: Diagnosis not present

## 2018-01-11 DIAGNOSIS — Z452 Encounter for adjustment and management of vascular access device: Secondary | ICD-10-CM | POA: Diagnosis not present

## 2018-01-11 DIAGNOSIS — N179 Acute kidney failure, unspecified: Secondary | ICD-10-CM | POA: Diagnosis not present

## 2018-01-11 DIAGNOSIS — E119 Type 2 diabetes mellitus without complications: Secondary | ICD-10-CM | POA: Diagnosis not present

## 2018-01-16 DIAGNOSIS — I129 Hypertensive chronic kidney disease with stage 1 through stage 4 chronic kidney disease, or unspecified chronic kidney disease: Secondary | ICD-10-CM | POA: Diagnosis not present

## 2018-01-16 DIAGNOSIS — E119 Type 2 diabetes mellitus without complications: Secondary | ICD-10-CM | POA: Diagnosis not present

## 2018-01-16 DIAGNOSIS — E782 Mixed hyperlipidemia: Secondary | ICD-10-CM | POA: Diagnosis not present

## 2018-01-16 DIAGNOSIS — Z933 Colostomy status: Secondary | ICD-10-CM | POA: Diagnosis not present

## 2018-01-16 DIAGNOSIS — E039 Hypothyroidism, unspecified: Secondary | ICD-10-CM | POA: Diagnosis not present

## 2018-01-16 DIAGNOSIS — L89319 Pressure ulcer of right buttock, unspecified stage: Secondary | ICD-10-CM | POA: Diagnosis not present

## 2018-01-17 ENCOUNTER — Ambulatory Visit
Admission: RE | Admit: 2018-01-17 | Discharge: 2018-01-17 | Disposition: A | Discharge status: Home / Self Care | Payer: BLUE CROSS/BLUE SHIELD | Source: Ambulatory Visit | Attending: Hematology and Oncology | Admitting: Hematology and Oncology

## 2018-01-17 DIAGNOSIS — E785 Hyperlipidemia, unspecified: Secondary | ICD-10-CM | POA: Diagnosis not present

## 2018-01-17 DIAGNOSIS — C50212 Malignant neoplasm of upper-inner quadrant of left female breast: Secondary | ICD-10-CM

## 2018-01-17 DIAGNOSIS — Z13820 Encounter for screening for osteoporosis: Secondary | ICD-10-CM | POA: Diagnosis not present

## 2018-01-17 DIAGNOSIS — Z780 Asymptomatic menopausal state: Secondary | ICD-10-CM | POA: Diagnosis not present

## 2018-01-17 DIAGNOSIS — E860 Dehydration: Secondary | ICD-10-CM | POA: Diagnosis not present

## 2018-01-17 DIAGNOSIS — Z170 Estrogen receptor positive status [ER+]: Principal | ICD-10-CM

## 2018-01-17 DIAGNOSIS — L89309 Pressure ulcer of unspecified buttock, unspecified stage: Secondary | ICD-10-CM | POA: Diagnosis not present

## 2018-01-18 DIAGNOSIS — T83592A Infection and inflammatory reaction due to indwelling ureteral stent, initial encounter: Secondary | ICD-10-CM | POA: Diagnosis not present

## 2018-01-18 DIAGNOSIS — T82594D Other mechanical complication of infusion catheter, subsequent encounter: Secondary | ICD-10-CM | POA: Diagnosis not present

## 2018-01-18 IMAGING — CT CT CHEST W/ CM
2 of 5 series · 12 of 36 positions shown, 15 images · IV contrast (ISOVUE 300)
Comparison: 10/07/2016

CLINICAL DATA: Followup recurrent endometrioid endometrial/ovarian
carcinoma. Status post pelvic exenteration and radiation therapy.
Personal history of left breast carcinoma .

EXAM:
CT CHEST, ABDOMEN, AND PELVIS WITH CONTRAST
TECHNIQUE: Multidetector CT imaging of the chest, abdomen and pelvis was
performed following the standard protocol during bolus
administration of intravenous contrast.
CONTRAST:  100mL 083KSG-UPP IOPAMIDOL (083KSG-UPP) INJECTION 61%

[Series 2: cap with · axial · 0.82mm/px · z∈[-598,-118]mm · 9 of 122 slices shown, 12 images]
[im 13/122  mediastinal]
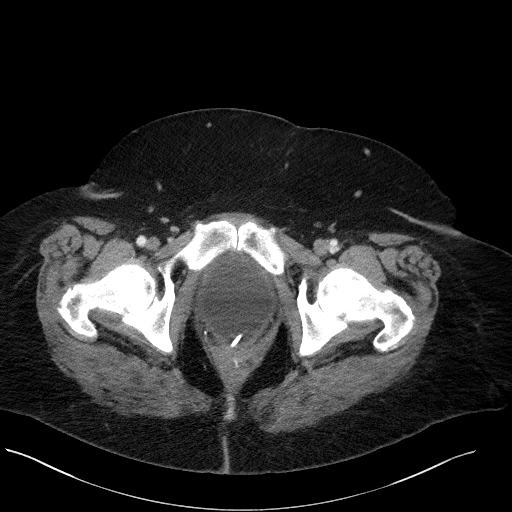
[im 13/122  lung]
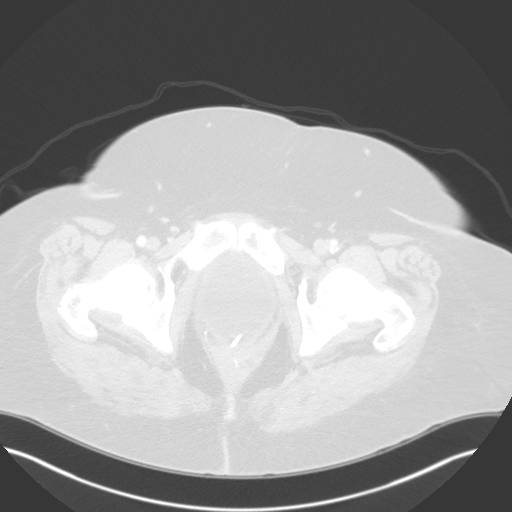
[im 25/122  lung]
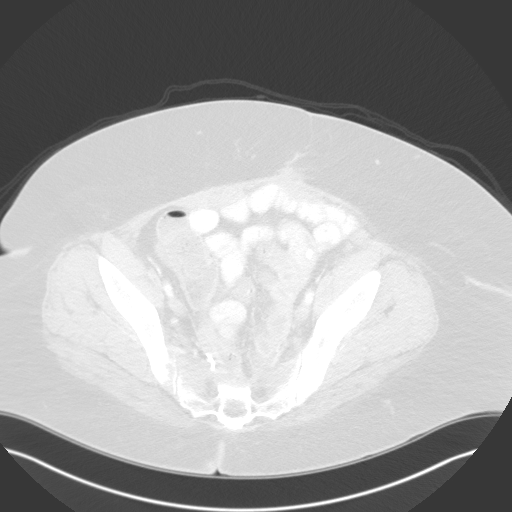
[im 37/122  lung]
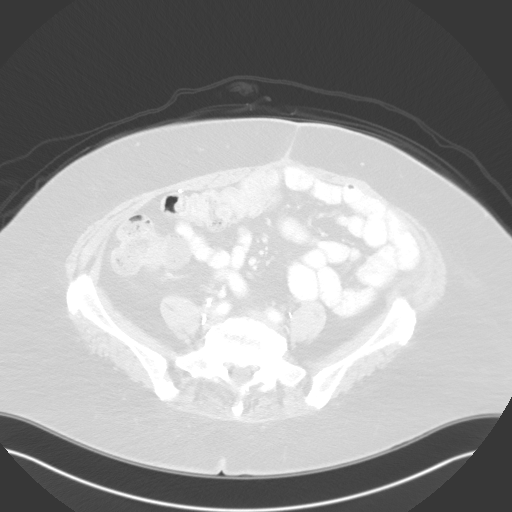
[im 49/122  lung]
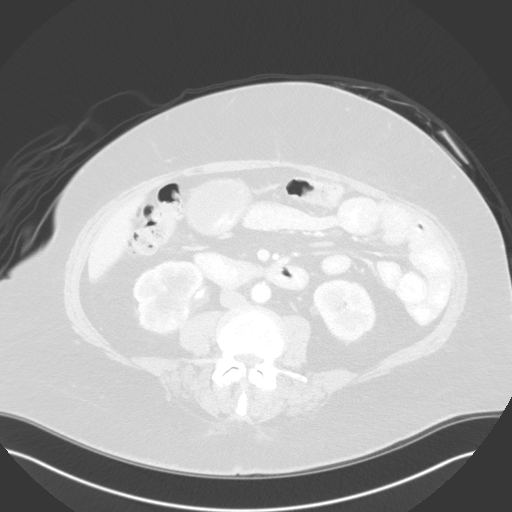
[im 61/122  mediastinal]
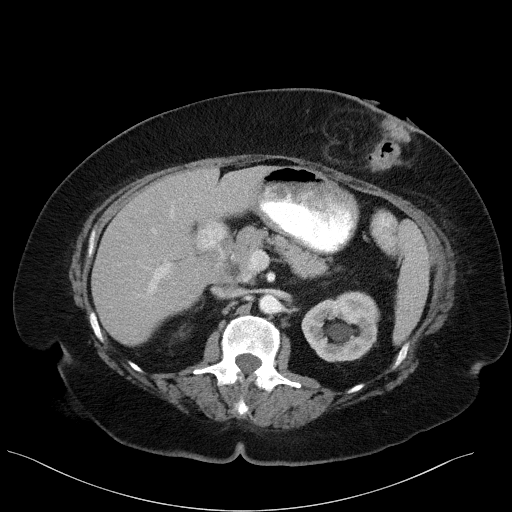
[im 61/122  lung]
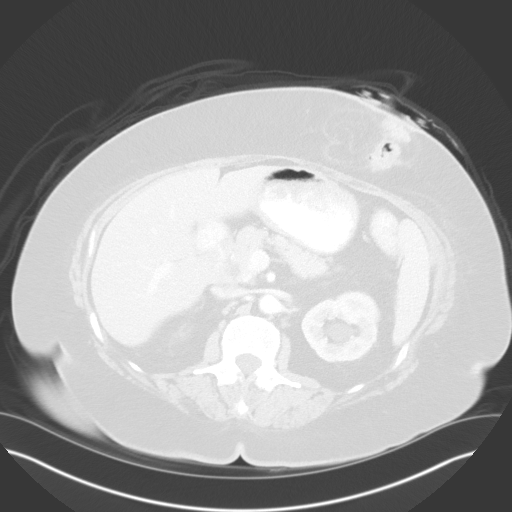
[im 73/122  lung]
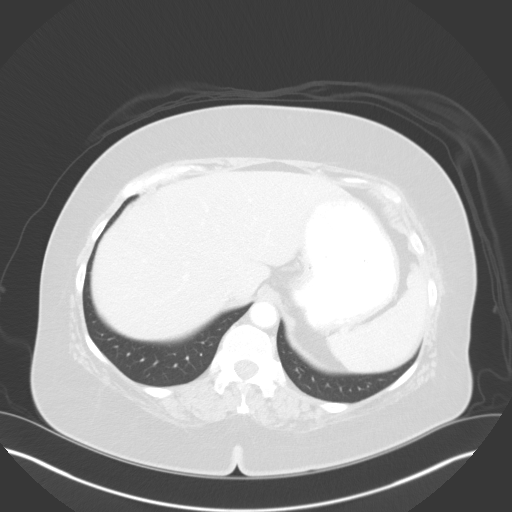
[im 85/122  lung]
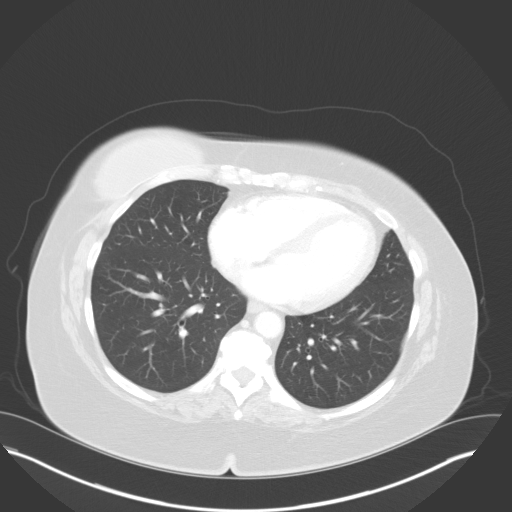
[im 97/122  lung]
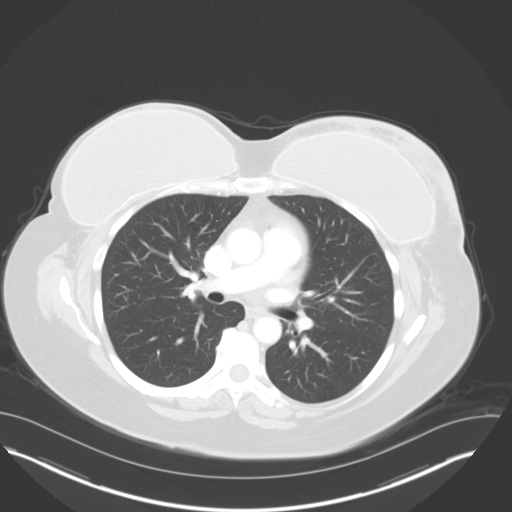
[im 109/122  mediastinal]
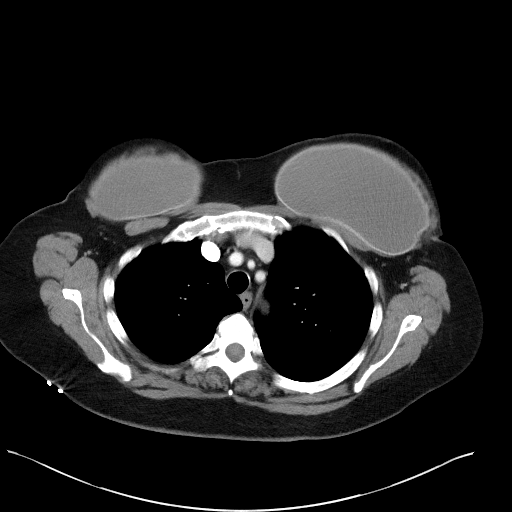
[im 109/122  lung]
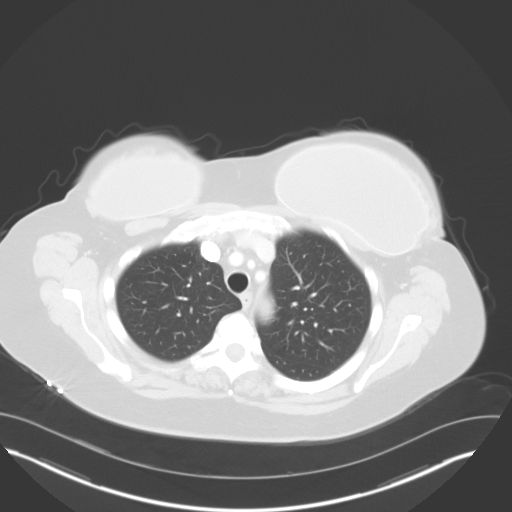

[Series 4: coronals · coronal · 1.01mm/px · 3 of 145 slices shown]
[im 29/145  lung]
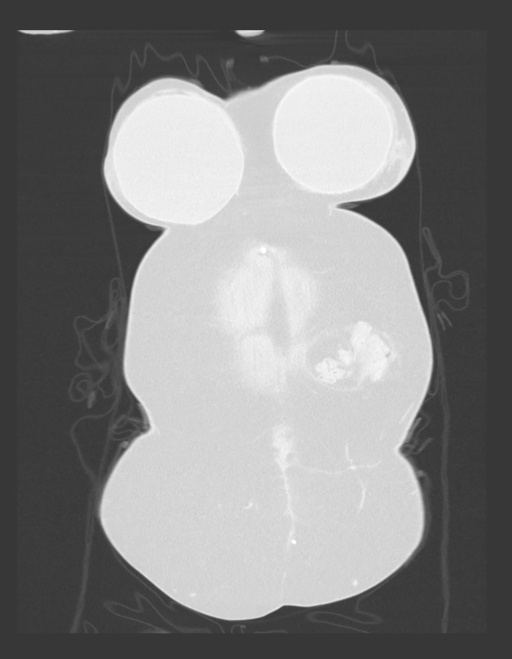
[im 58/145  lung]
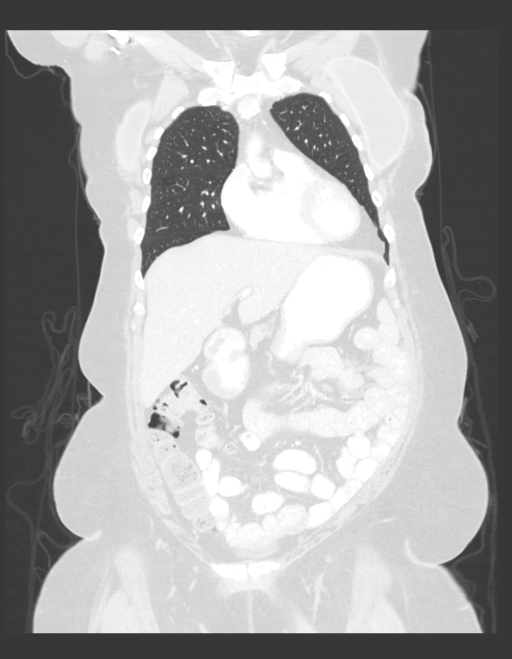
[im 87/145  lung]
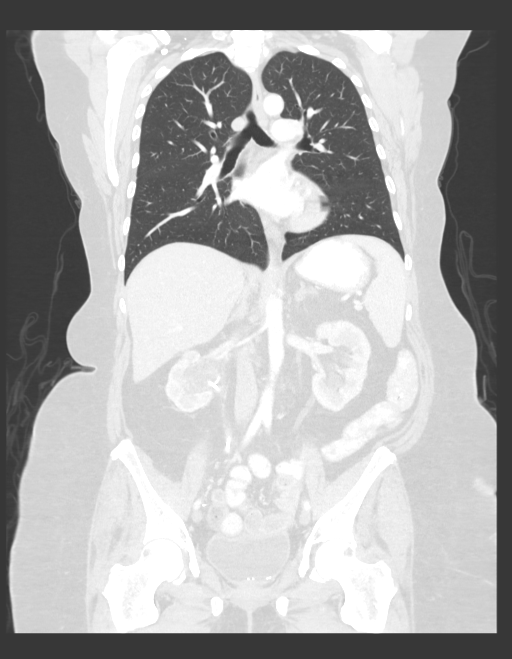

[12 of 36 positions shown; findings below may reference images not displayed]

FINDINGS: CT CHEST FINDINGS

Cardiovascular: No acute findings. Aortic atherosclerosis.

Mediastinum/Lymph Nodes: Stable 1.3 cm left thyroid lobe nodule.
Surgical clips in left axilla. No other masses or pathologically
enlarged lymph nodes identified.

Lungs/Pleura: No pulmonary infiltrate or mass identified. No
effusion present.

Musculoskeletal: Bilateral breast implants again seen. No suspicious
bone lesions identified.

CT ABDOMEN AND PELVIS FINDINGS

Hepatobiliary: No masses identified. Prior cholecystectomy noted.
Stable mild dilatation of extrahepatic common bile duct.

Pancreas:  No mass or inflammatory changes.

Spleen:  Within normal limits in size and appearance.

Adrenals/Urinary tract: Stable small low-attenuation left adrenal
adenoma. No evidence of renal mass. Stable right renal parenchymal
scarring. Right ureteral stent remains in appropriate position, with
no significant change in degree of mild to moderate right
hydroureteronephrosis.

Stomach/Bowel: Stable postop changes from left colectomy with left
lower quadrant colostomy. No evidence of bowel obstruction,
inflammatory process, or abnormal fluid collections.

Vascular/Lymphatic: No pathologically enlarged lymph nodes
identified. No abdominal aortic aneurysm.

Reproductive: Prior hysterectomy noted. Adnexal regions are
unremarkable in appearance. Surgical staples and ill-defined soft
tissue density are again seen in the presacral region which are
stable and consistent with post radiation changes.

Other:  None.

Musculoskeletal:  No suspicious bone lesions identified.
IMPRESSION: Stable postop and post radiation changes in presacral region. No
evidence of recurrent or metastatic carcinoma within the chest,
abdomen, or pelvis.

Stable mild to moderate right hydroureteronephrosis, with right
ureteral stent in appropriate position.

Stable small benign left adrenal adenoma and left thyroid lobe
nodule.

## 2018-01-19 ENCOUNTER — Telehealth: Payer: Self-pay | Admitting: Family Medicine

## 2018-01-19 NOTE — Telephone Encounter (Signed)
Copied from [place] [phone]. Topic: Quick Communication - Rx Refill/Question >> Jan 19, 2018  6:42 PM [name] wrote: Medication: fentaNYL ([place]) 25 MCG/HR patch [[id number]]   Pt's husband called and pt is in a lot of pain and is wondering if she can double up on her patch, she is having complications w/ her tubes as well; pt was advised to go to the ER but they will wait until the morning to hear from pcp about medication; call pt to advise

## 2018-01-20 NOTE — Telephone Encounter (Signed)
Pt. Requesting to go back to Fentanyl 50 mcg patches. Had to have nephrostomy tubes replaced around 01/05/18 per pt. Pain is not controlled with the 25 mcg. Taking Tylenol at intervals. Can not sleep well at night due to discomfort. Please advise pt.

## 2018-01-23 ENCOUNTER — Other Ambulatory Visit (HOSPITAL_COMMUNITY): Payer: Self-pay

## 2018-01-23 ENCOUNTER — Other Ambulatory Visit: Payer: Self-pay | Admitting: Family Medicine

## 2018-01-23 DIAGNOSIS — C55 Malignant neoplasm of uterus, part unspecified: Secondary | ICD-10-CM

## 2018-01-23 MED ORDER — FENTANYL 50 MCG/HR TD PT72: 50.0000 ug | MEDICATED_PATCH | TRANSDERMAL | 0 refills | Status: DC

## 2018-01-23 NOTE — Telephone Encounter (Signed)
Advised patient that medication was sent in.  She will check with urologist on 7/18 to see if he will take over the refills for patches and lyrica

## 2018-01-23 NOTE — Telephone Encounter (Signed)
Can we write for 49mcg and will advise for her to check with specialist to continue writing.

## 2018-01-23 NOTE — Telephone Encounter (Signed)
70  Mcg sent in See if maybe one the specialist is willing to take over the pain meds so she does not needs to come here for that.  I'm just trying to save her a trip ---  If not its ok we will write but she will need to be seen

## 2018-01-25 DIAGNOSIS — Z452 Encounter for adjustment and management of vascular access device: Secondary | ICD-10-CM | POA: Diagnosis not present

## 2018-01-25 DIAGNOSIS — T82594D Other mechanical complication of infusion catheter, subsequent encounter: Secondary | ICD-10-CM | POA: Diagnosis not present

## 2018-01-25 DIAGNOSIS — E860 Dehydration: Secondary | ICD-10-CM | POA: Diagnosis not present

## 2018-01-25 DIAGNOSIS — Z5181 Encounter for therapeutic drug level monitoring: Secondary | ICD-10-CM | POA: Diagnosis not present

## 2018-01-25 DIAGNOSIS — N179 Acute kidney failure, unspecified: Secondary | ICD-10-CM | POA: Diagnosis not present

## 2018-01-26 DIAGNOSIS — T82594D Other mechanical complication of infusion catheter, subsequent encounter: Secondary | ICD-10-CM | POA: Diagnosis not present

## 2018-01-27 ENCOUNTER — Inpatient Hospital Stay: Payer: BLUE CROSS/BLUE SHIELD | Attending: Hematology and Oncology | Admitting: Gynecologic Oncology

## 2018-01-27 ENCOUNTER — Encounter: Payer: Self-pay | Admitting: Gynecologic Oncology

## 2018-01-27 ENCOUNTER — Inpatient Hospital Stay: Payer: BLUE CROSS/BLUE SHIELD

## 2018-01-27 VITALS — BP 142/45 | HR 82 | Temp 99.3°F | Resp 18 | Ht 62.0 in | Wt 211.9 lb

## 2018-01-27 DIAGNOSIS — Z923 Personal history of irradiation: Secondary | ICD-10-CM | POA: Diagnosis not present

## 2018-01-27 DIAGNOSIS — Z170 Estrogen receptor positive status [ER+]: Secondary | ICD-10-CM | POA: Diagnosis not present

## 2018-01-27 DIAGNOSIS — C541 Malignant neoplasm of endometrium: Secondary | ICD-10-CM

## 2018-01-27 DIAGNOSIS — L89319 Pressure ulcer of right buttock, unspecified stage: Secondary | ICD-10-CM | POA: Diagnosis not present

## 2018-01-27 DIAGNOSIS — Z90722 Acquired absence of ovaries, bilateral: Secondary | ICD-10-CM | POA: Diagnosis not present

## 2018-01-27 DIAGNOSIS — C569 Malignant neoplasm of unspecified ovary: Secondary | ICD-10-CM

## 2018-01-27 DIAGNOSIS — Z933 Colostomy status: Secondary | ICD-10-CM | POA: Diagnosis not present

## 2018-01-27 DIAGNOSIS — Z90710 Acquired absence of both cervix and uterus: Secondary | ICD-10-CM | POA: Diagnosis not present

## 2018-01-27 DIAGNOSIS — C50912 Malignant neoplasm of unspecified site of left female breast: Secondary | ICD-10-CM | POA: Insufficient documentation

## 2018-01-27 DIAGNOSIS — N3000 Acute cystitis without hematuria: Secondary | ICD-10-CM

## 2018-01-27 LAB — BASIC METABOLIC PANEL
Anion gap: 8 (ref 5–15)
BUN: 31 mg/dL — AB (ref 8–23)
CHLORIDE: 108 mmol/L (ref 98–111)
CO2: 24 mmol/L (ref 22–32)
CREATININE: 1.81 mg/dL — AB (ref 0.44–1.00)
Calcium: 9.5 mg/dL (ref 8.9–10.3)
GFR calc Af Amer: 33 mL/min — ABNORMAL LOW (ref >60)
GFR calc non Af Amer: 29 mL/min — ABNORMAL LOW (ref >60)
GLUCOSE: 113 mg/dL — AB (ref 70–99)
Potassium: 4 mmol/L (ref 3.5–5.1)
Sodium: 140 mmol/L (ref 135–145)

## 2018-01-27 MED ORDER — CIPROFLOXACIN HCL 250 MG PO TABS: 250.0000 mg | ORAL_TABLET | Freq: Two times a day (BID) | ORAL | 1 refills | Status: DC

## 2018-01-27 NOTE — Patient Instructions (Signed)
Dr Denman [name] has ordered ciprofloxacin to take morning and night for 7 days. She will check a CT to evaluate for recurrent endometrial cancer.  Dr Denman [name] will see you back in 6 months to re-evaluate for cancer recurrence.

## 2018-01-27 NOTE — Progress Notes (Signed)
Recurrent Endometrial cancer FOLLOWUP VISIT  Assessment:    62 y.o. year old with history of recurrent endometrioid endometrial/ovarian cancer.   S/p exploratory laparotomy, posterior supralevator pelvic exenteration, end colostomy, bladder repair, ureteral stenting with complete resection and negative margins on 11/09/14. S/p adjuvant radiation completed 03/10/15 Disease free since this time.  Postoperative and post-radiation right hydroureter and distal ureteral obstruction with ureteral stent in situ, repair complicated by vesico-colonic fistula formation.  No recurrence/persistence of tumor on imaging and physical exam (complete clinical response).   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 No specific follow-up or surveillance recommended by genetics counselor.  Plan: 1) History of recurrent endometrial/ovarian cancer: s/p complete resection with negative margins and s/p adjuvant radiation. No chemotherapy due to chronic idiopathic neutropenia.   CT imaging post treatment shows stable thickening of upper vagina and right peri-urethral tissues which is most likely radiation changes and postop changes. Will re-evaluate CT abd/pelvis to ensure no occult recurrence of disease given plans for extensive reconstructive surgery later this year.  Will continue 6 monthly surveillance exams.  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  Continuing follow-up with Dr Tresa Moore for stent replacements and management. Is now consulting with [facility] urologists, plastic surgeons and colorectal surgeons for possible radical reconstruction procedure this year. Given her symptoms  of UTI, I will send left nephrostomy bag urine for culture and treat empirically with cipro x 1 week.   3) neutropenia - Dr Alvy Bimler is managing this and we appreciate. Patient is having bone and joint aches and wonders if this is from more frequent Granix injections.   4) breast cancer - stage IIA. ER/PR positive. Treated with surgery and arimidex.  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 Will have patient follow-up with Dr Cristina Gong after breast surgery for close colon cancer and upper GI cancer surveillance  6) Follow-up: I will see [name], [name].  HPI:  [name] is a 62 y.o. 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  She has a history of endometrial and ovarian endometrioid carcinoma treated in 2006 by Dr. [name] at [facility] in [place]. Her surgery (TAH, BSO) was followed by adjuvant chemotherapy with carboplatin plus paclitaxel due to the ovarian involvement and the presence of a cul de sac lesion also positive for disease. She denies receiving adjuvant radiation therapy. She is unclear if she had metastatic endometrial cancer to the ovary or duel primaries. She had a complete response to therapy however developed leukopenia in July 2010. 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 She sees Dr. Alvy Bimler for this and is treated with G-CSF injections.  She began experiencing rectal pain approximately in January 2016. She also reports narrowing of caliber of the stool. She denies hematochezia. She does report approximately 3 months of vaginal spotting. She's had no specific follow-up for her gynecologic cancers in the past 4 years.  As part of workup of her rectal pain she underwent a CT scan of the abdomen  and pelvis on 09/12/2014. This demonstrated a new  right perirectal mass abutting the vaginal cuff measuring 3.8 x 4.9 cm. There is a limited fat plane between the mass and the rectum posteriorly. Rectal invasion could not be excluded. There were no other masses identified in the abdomen and pelvis or lymphadenopathy. There is no other evidence of metastatic disease or recurrent disease. There was no hydronephrosis. A CA-125 drawn on 09/12/2014 was normal at 10.  PET was negative for extrapelvic disease. MRI defined the lesion as a 3.5x5cm lesion to the right of the rectum at the vaginal cuff.  Colonoscopy was performed on 10/02/14 with transrectal [place] and biopsy and this revealed endometrioid adenocarcinoma. Of note, the lesion was not seen within the lumen of the rectum.   She then underwent a posterior supralevator exenteration with colostomy and bladder repair and stent placement on 1/61/09 without complications with Dr Denman [name] and Dr Johney Maine.  Her postoperative course was uncomplicate with the exception of development of postop anemia.  Her final pathology revealed endometrioid adenocarcinoma invading the vagina and rectum with negative margins on the specimen. The margin had been close (clinically) to the right pelvic sidewall which was marked with surgical clips.  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 It was treated with IV antibiotics and then on POD 15 she was taken to the OR for laparoscopic revision of the stoma with Dr Michael [name]. Postoperatively she had wound vac and packing for her stomal wound.  A retrograde cystogram on week 4 postop confirmed an intact bladder and the foley was removed.  She initially had some voiding issues with decreased sensation to void. We tested a post void residual in May, 2016 and this revealed adequate voiding.  On 12/30/14 she was admitted to [facility] with sepsis associated with  Enterobacter Cloacae UTI. This was treated with IV antibiotics and then a prolonged course of oral cipro. Imaging performed at the time of admission (a CT of the abdo/pelvis) revealed: Bilateral double-J internal ureteral stents in adequate position with persistent mild bilateral hydronephrosis   She complete radiation therapy from 01/29/15 to 03/10/15 with 50Gy of external beam radiation and IMRT. She tolerated therapy well with minor skin irritation.  She had her ureteral stents removed in June, 2016, however, then developed right ureteral obstruction, hydroureter and pain. She went to the OR with Urologist Dr Tresa Moore on 03/20/15 for ureteral dilation and right stent placement (the left was draining well). No tumor was seen on cysto.   CT abdo/pelvis on 05/14/15 showed: no new lesions, stable thickening in right pelvis/distal right ureter consistent with radiation effect. The right ureteral stent was removed.  The patient's CT in January 2017 showed progression of her right ureteral obstruction after stent removal. Dr Tresa Moore took her to the OR on 09/05/15 for a cystoscopy, retrograde pyelogram and right ureteral stent placement.   The CT on 08/18/15 showed decreased attenuation of the right renal parenchyma, right hydronephrosis that was severe no excretion of contrast on that for graphic phase imaging. The perivascular soft tissue thickening in the pelvis and presacral regions grossly stable consistent with radiation changes.  The patient was diagnosed with ER/PR positive left breast cancer in April 2017. Surgery is scheduled for May. She underwent bilateral mastectomy with expander reconstruction. Postoperatively she was treated with Arimidex.   Repeat CT imaging on 02/08/16 revealed : Stable post treatment changes in presacral region. No evidence of recurrent or metastatic carcinoma within the abdomen or pelvis.  Interval placement of right ureteral stent in appropriate position. Moderate right  hydroureteronephrosis remains stable. Stable benign left adrenal adenoma and hepatic steatosis.  On 06/30/16 she had replacement of her right ureteral stent with Dr Tresa Moore.  On 10/07/16 CT surveillance was performed which showed: Stable exam.  No new or progressive findings. Stable appearance abnormal presacral soft tissue compatible with post treatment change. Internal right ureteral stent with persistent right hydroureteronephrosis. Differentially decreased perfusion of the right kidney suggests a component of underlying obstructive uropathy. Stable 13 mm left adrenal nodule, previously characterized as adenoma. A parastomal hernia was noted.  CT 01/27/17 showed stable findings with no evidence of recurrence. Persistent right hydroureter was again noted.  Her colostomy bothered her greatly and she hoped to be free of stomal appliances. She was frustrated with repetitive stent replacements and desired definitive reconstruction/reimplantation of the right ureter.    Given that a 2 year disease free interval had elapsed, in 04/01/17 Dr Tresa Moore and Dr Johney Maine returned to the operating room with Ms Albea for a right ureteral resection, and bladder hitch with reimplantation and reversal of hartman's with temporary ileostomy complicated by postop development of urinoma which required a peritoneal drain and right ureteral stent.  Interval Hx:  She subsequently developed a colovesicular fistula which caused breakdown of the skin on her buttocks from chronic maceration and immobility due to her convalescence. She was offered permanent diversion with ileal conduit and permanent end colostomy. This was not considered a reasonable option at that time by [facility]. She became quite depressed by the slow recovery and sequelae of her last surgery. She sought second opinion with Gynecologic Oncology at [facility]. They offered referral to urology and colorectal specialists at [facility]. They recommended no additional cancer  treatment as she was disease free. She is being worked up by them for possible laparotomy, revision of right uretero-neocystotomy, placement of VRAM flap, and possible bowel resection and revision of ileostomy.   She reports left flank pain, no fevers, cloudiness of left nephrostomy tube urine.   Review of systems: Constitutional:  She has no weight gain or weight loss. She has a low grade fever no chills. Eyes: No blurred vision Ears, Nose, Mouth, Throat: No dizziness, headaches or changes in hearing. No mouth sores. Cardiovascular: No chest pain, palpitations or edema. Respiratory:  No shortness of breath, wheezing  Gastrointestinal: She has normal bowel movements trhough stoma without diarrhea or constipation. She denies any nausea or vomiting. She denies blood in her stool or heart burn. Genitourinary:  See HPI Musculoskeletal: Denies muscle weakness or joint pains.  Skin:  She has no skin changes, rashes or itching Neurological:  Denies dizziness or headaches. No neuropathy, no numbness or tingling. Psychiatric:  She denies depression or anxiety. Hematologic/Lymphatic:   No easy bruising or bleeding  Allergies  Allergen Reactions  . Penicillins Swelling    Facial swelling/childhood allergy Has patient had a PCN reaction causing immediate rash, facial/tongue/throat swelling, SOB or lightheadedness with hypotension: Yes Has patient had a PCN reaction causing severe rash involving mucus membranes or skin necrosis: Yes Has patient had a PCN reaction that required hospitalization yes Has patient had a PCN reaction occurring within the last 10 years: No If all of the above answers are "NO", then may proceed with Cephalosporin use.   . Cefaclor Rash    Ceclor  . Erythromycin Other (See Comments)    Gastritis, abd cramps  . Tape Rash    blisters  . Trimethoprim Rash  .  Ultram [Tramadol] Hives  . Cephalosporins Rash  . Fluconazole Rash  . Oxycodone Other (See Comments)    " I just  feel weird"  . Pectin Rash    Pectin ring for stoma  . Septra [Sulfamethoxazole-Trimethoprim] Rash  . Sulfa Antibiotics Rash    Current Outpatient Medications on File Prior to Visit  Medication Sig Dispense Refill  . acetaminophen (TYLENOL) 325 MG tablet Take 2 tablets (650 mg total) by mouth every 6 (six) hours as needed. (Patient taking differently: Take 650 mg by mouth every 6 (six) hours as needed for mild pain. ) 30 tablet 1  . anastrozole (ARIMIDEX) 1 MG tablet Take 1 tablet (1 mg total) by mouth daily. 30 tablet 3  . Biotin 5 MG TABS Take 5 mg by mouth every morning.     . Cholecalciferol (VITAMIN D3) 10000 UNITS capsule Take 10,000 Units by mouth once a week. Sunday evening's    . collagenase (SANTYL) ointment Apply 1 application topically daily as needed (skin care).     . diphenhydrAMINE (BENADRYL) 25 mg capsule Take 1 capsule (25 mg total) by mouth every 8 (eight) hours as needed for itching, allergies or sleep. 30 capsule 0  . diphenoxylate-atropine (LOMOTIL) 2.5-0.025 MG tablet Take 1 tablet by mouth 4 (four) times daily as needed for diarrhea or loose stools. TO PREVENT LOOSE BOWEL MOVEMENTS (Patient taking differently: Take 1 tablet by mouth every other day. TO PREVENT LOOSE BOWEL MOVEMENTS) 30 tablet 0  . fentaNYL (DURAGESIC) 50 MCG/HR Place 1 patch (50 mcg total) onto the skin every 3 (three) days. 10 patch 0  . ferrous sulfate 325 (65 FE) MG tablet Take 1 tablet (325 mg total) by mouth at bedtime. 30 tablet 3  . heparin lock flush 100 UNIT/ML SOLN injection Inject 500 Units into the vein daily.    . insulin aspart (NOVOLOG) 100 UNIT/ML injection Inject 7 Units into the skin 3 (three) times daily with meals. (Patient taking differently: Inject 4 Units into the skin 3 (three) times daily with meals. ) 10 mL 11  . insulin glargine (LANTUS) 100 UNIT/ML injection Inject 14 Units into the skin 2 (two) times daily.    [name] Kitchen levothyroxine (SYNTHROID, LEVOTHROID) 150 MCG tablet Take 1  tablet (150 mcg total) by mouth daily before breakfast. 30 tablet 1  . loratadine (CLARITIN) 10 MG tablet Take 10 mg by mouth every morning.     [name] Kitchen MAGNESIUM SULFATE IV Inject 2 g into the vein every Wednesday. Given in 1L 0.9% NS by [facility]    . nitrofurantoin, macrocrystal-monohydrate, (MACROBID) 100 MG capsule Take by mouth.    . omega-3 acid ethyl esters (LOVAZA) 1 G capsule Take 1 g by mouth 2 (two) times daily.     . pregabalin (LYRICA) 50 MG capsule Take 1 capsule (50 mg total) by mouth 2 (two) times daily. 180 capsule 1  . Prenatal Vit-Fe Fumarate-FA (PRENATAL VITAMIN PO) Take 1 capsule by mouth daily. Takes prenatal because there are no dyes in it    . protein supplement shake (PREMIER PROTEIN) LIQD Take 325 mLs (11 oz total) by mouth 2 (two) times daily as needed (Pt to request). 60 Can 1  . psyllium (HYDROCIL/METAMUCIL) 95 % PACK Take 1 packet by mouth 2 (two) times daily. (Patient taking differently: Take 1 packet by mouth 2 (two) times daily as needed for mild constipation. ) 56 each 1  . ranitidine (ZANTAC) 150 MG tablet Take 1 tablet (150 mg total) by mouth 2 (two)  times daily as needed for heartburn. (Patient taking differently: Take 150 mg by mouth at bedtime. ) 60 tablet 1  . rosuvastatin (CRESTOR) 10 MG tablet Take 10 mg by mouth every evening.     . sodium chloride 0.9 % 1,000 mL Inject 500 mLs into the vein once a week. On Wednesday with magnesium    . sodium chloride 0.9 % injection Inject 10 mLs into the vein daily.    [name] Kitchen JANUVIA 50 MG tablet     . loperamide (IMODIUM) 2 MG capsule Take 1-2 capsules (2-4 mg total) by mouth every 8 (eight) hours as needed for diarrhea or loose stools (Use if >2 BM every 8 hours). 30 capsule 0  . Polyethyl Glycol-Propyl Glycol (SYSTANE OP) Place 1 drop into both eyes daily as needed (dry eyes).     . Tbo-Filgrastim (GRANIX) 480 MCG/0.8ML SOSY injection Inject 0.8 mLs (480 mcg total) into the skin daily. (Patient not taking: Reported on 4/30/[name]) 30  Syringe 11   No current facility-administered medications on file prior to visit.     Past Medical History:  Diagnosis Date  . Adrenal adenoma, left 02/08/2016   CT: stable benign  . Anemia in neoplastic disease   . Benign essential HTN   . 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 Cancer of corpus uteri, except isthmus [facility])  oncologist-- dr Denman [name] and dr Alvy Bimler    10-15-2004  dx endometroid endometrial and ovarian cancer s/p  chemotheapy and surgery(TAH w/ BSO) :  recurrent 11-19-2014 post pelvic surgery and radiation 01-29-2015 to 03-10-2015  . Chronic idiopathic neutropenia (HCC)    presumed related to chemotherapy March 2006--- followed by dr Alvy Bimler (treatment w/ G-CSF injections  . Chronic nausea   . Chronic pain    perineal/ anal  area from bladder pad irritates skin , right flank pain  . CKD stage G2/A3, GFR 60-89 and albumin creatinine ratio >300 mg/g    nephrologist-  dr Madelon Lips  . Diabetic retinopathy, background ([place])   . Difficult intravenous access    small veins--- hx PICC lines  . DM type 2 (diabetes mellitus, type 2) ([place])    monitored by dr Legrand Como altheimer  . Dysuria   . Environmental and seasonal allergies   . Fatty liver 02/08/2016   CT  . Generalized muscle weakness   . GERD (gastroesophageal reflux disease)   . Hiatal hernia   . History of abdominal abscess 04/16/2017   post surgery 04-01-2017  --- resolved 10/ 2018  . History of gastric polyp    2014  duodenum  . History of ileus 04/16/2017   resolved w/ no surgical intervention  . History of radiation therapy    01-29-2015 to 03-10-2015  pelvis 50.4Gy  . Hypothyroidism    monitored by dr Legrand Como altheimer  . Ileostomy in place [facility]) 04/01/2017   created at same time colostomy takedown.  . Lower urinary  tract symptoms (LUTS)    urge urinary  incontinence  . Mixed dyslipidemia   . Multiple thyroid nodules    Managed by Dr. Harlow Asa  . Pelvic abscess in female 04/16/2017  . PONV (postoperative nausea and vomiting)    "scopolamine patch works for me"  . Radiation-induced dermatitis    contact dermatitis , radiation completed, rash only on ankles now.  . Seasonal  allergies   . Ureteral stricture, right UROLOGIT-  DR [facility]   CHRONIC--  TREATMENT URETERAL STENT  . 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 Vitamin D deficiency   . Wears glasses     Past Surgical History:  Procedure Laterality Date  . APPENDECTOMY    . BREAST RECONSTRUCTION WITH PLACEMENT OF TISSUE EXPANDER AND FLEX HD (ACELLULAR HYDRATED DERMIS) Bilateral 12/11/2015   Procedure: BILATERAL BREAST RECONSTRUCTION WITH PLACEMENT OF TISSUE EXPANDERS;  Surgeon: Irene Limbo, MD;  Location: [place];  Service: Plastics;  Laterality: Bilateral;  . COLONOSCOPY WITH PROPOFOL N/A 08/21/2013   Procedure: COLONOSCOPY WITH PROPOFOL;  Surgeon: Cleotis Nipper, MD;  Location: [facility];  Service: Endoscopy;  Laterality: N/A;  . COLOSTOMY TAKEDOWN N/A 12/04/2014   Procedure: LAPROSCOPIC LYSIS OF ADHESIONS, SPLENIC MOBILIZATION, RELOCATION OF COLOSTOMY, DEBRIDEMENT INITIAL COLOSTOMY SITE;  Surgeon: Michael [name], MD;  Location: [facility];  Service: General;  Laterality: N/A;  . CYSTOGRAM N/A 06/01/2017   Procedure: CYSTOGRAM;  Surgeon: Alexis Frock, MD;  Location: [facility];  Service: Urology;  Laterality: N/A;  . CYSTOSCOPY W/ RETROGRADES Right 11/21/2015   Procedure: CYSTOSCOPY WITH RETROGRADE PYELOGRAM;  Surgeon: Alexis Frock, MD;  [facility]: [facility];  Service: Urology;  Laterality: Right;  . CYSTOSCOPY W/ URETERAL STENT PLACEMENT Right 11/21/2015   Procedure: CYSTOSCOPY WITH STENT REPLACEMENT;  Surgeon: Alexis Frock, MD;  [facility]: [facility];  Service: Urology;  Laterality: Right;  . CYSTOSCOPY W/ URETERAL STENT PLACEMENT Right 03/10/2016    Procedure: CYSTOSCOPY WITH STENT REPLACEMENT;  Surgeon: Alexis Frock, MD;  Location: [facility];  Service: Urology;  Laterality: Right;  . CYSTOSCOPY W/ URETERAL STENT PLACEMENT Right 06/30/2016   Procedure: CYSTOSCOPY WITH RETROGRADE PYELOGRAM/URETERAL STENT EXCHANGE;  Surgeon: Alexis Frock, MD;  Location: [facility];  Service: Urology;  Laterality: Right;  . CYSTOSCOPY W/ URETERAL STENT PLACEMENT N/A 06/01/2017   Procedure: CYSTOSCOPY WITH EXAM UNDER ANESTHESIA;  Surgeon: Alexis Frock, MD;  [facility];  Service: Urology;  Laterality: N/A;  . CYSTOSCOPY W/ URETERAL STENT PLACEMENT Right 1/16/[name]   Procedure: CYSTOSCOPY WITH RETROGRADE PYELOGRAM/URETERAL STENT REMOVAL;  Surgeon: Alexis Frock, MD;  Location: [facility];  Service: Urology;  Laterality: Right;  . CYSTOSCOPY WITH RETROGRADE PYELOGRAM, URETEROSCOPY AND STENT PLACEMENT Right 03/20/2015   Procedure: CYSTOSCOPY WITH RETROGRADE PYELOGRAM, URETEROSCOPY WITH BALLOON DILATION AND STENT PLACEMENT ON RIGHT;  Surgeon: Alexis Frock, MD;  Location: [facility];  Service: Urology;  Laterality: Right;  . CYSTOSCOPY WITH RETROGRADE PYELOGRAM, URETEROSCOPY AND STENT PLACEMENT Right 05/02/2015   Procedure: CYSTOSCOPY WITH RIGHT RETROGRADE PYELOGRAM,  DIAGNOSTIC URETEROSCOPY AND STENT PULL ;  Surgeon: Alexis Frock, MD;  Location: [facility];  Service: Urology;  Laterality: Right;  . CYSTOSCOPY WITH RETROGRADE PYELOGRAM, URETEROSCOPY AND STENT PLACEMENT Right 09/05/2015   Procedure: CYSTOSCOPY WITH RETROGRADE PYELOGRAM,  AND STENT PLACEMENT;  Surgeon: Alexis Frock, MD;  [facility];  Service: Urology;  Laterality: Right;  . CYSTOSCOPY WITH RETROGRADE PYELOGRAM, URETEROSCOPY AND STENT PLACEMENT Right 04/01/2017   Procedure: CYSTOSCOPY WITH RETROGRADE PYELOGRAM, URETEROSCOPY AND STENT PLACEMENT;  Surgeon: Alexis Frock, MD;  [facility];  Service:  Urology;  Laterality: Right;  . CYSTOSCOPY WITH STENT PLACEMENT Right 10/27/2016   Procedure: CYSTOSCOPY WITH STENT CHANGE and right retrograde pyelogram;  Surgeon: Alexis Frock, MD;  Location: [facility];  Service: Urology;  Laterality: Right;  . EUS N/A 10/02/2014   Procedure: LOWER ENDOSCOPIC ULTRASOUND (EUS);  Surgeon: Arta Silence, MD;  Location: [facility];  Service: Endoscopy;  Laterality: N/A;  . EXCISION SOFT TISSUE MASS RIGHT FOREMAN  12-08-2006  . EYE SURGERY  as child   pytosis of eyelids repair  . INCISION AND DRAINAGE OF WOUND Bilateral 12/26/2015   Procedure: DEBRIDEMENT OF BILATERAL MASTECTOMY FLAPS;  Surgeon: Irene Limbo, MD;  Location: [place];  Service: Plastics;  Laterality: Bilateral;  . IR CV LINE INJECTION  05/31/2017  . IR FLUORO GUIDE CV LINE LEFT  05/31/2017  . IR FLUORO GUIDE CV LINE RIGHT  04/06/2017  . IR FLUORO GUIDE CV MIDLINE PICC RIGHT  05/30/2017  . IR NEPHROSTOGRAM LEFT INITIAL PLACEMENT  2/1/[name]  . IR NEPHROSTOGRAM LEFT THRU EXISTING ACCESS  4/30/[name]  . IR NEPHROSTOGRAM RIGHT INITIAL PLACEMENT  2/1/[name]  . IR NEPHROSTOGRAM RIGHT THRU EXISTING ACCESS  2/12/[name]  . IR NEPHROSTOGRAM RIGHT THRU EXISTING ACCESS  4/30/[name]  . IR NEPHROSTOMY EXCHANGE LEFT  4/29/[name]  . IR NEPHROSTOMY EXCHANGE LEFT  6/6/[name]  . IR NEPHROSTOMY EXCHANGE RIGHT  3/3/[name]  . IR NEPHROSTOMY EXCHANGE RIGHT  4/29/[name]  . IR NEPHROSTOMY EXCHANGE RIGHT  6/6/[name]  . IR NEPHROSTOMY PLACEMENT LEFT  3/3/[name]  . IR RADIOLOGIST EVAL & MGMT  05/03/2017  . IR US GUIDE VASC ACCESS LEFT  05/31/2017  . IR US GUIDE VASC ACCESS RIGHT  04/06/2017  . IR US GUIDE VASC ACCESS RIGHT  05/30/2017  . LAPAROSCOPIC CHOLECYSTECTOMY  1990  . LIPOSUCTION WITH LIPOFILLING Bilateral 04/16/2016   Procedure: LIPOSUCTION WITH LIPOFILLING TO BILATERAL CHEST;  Surgeon: Irene Limbo, MD;  Location: [place];  Service: Plastics;  Laterality: Bilateral;  .  MASTECTOMY W/ SENTINEL NODE BIOPSY Bilateral 12/11/2015   Procedure: RIGHT PROPHYLACTIC MASTECTOMY, LEFT TOTAL MASTECTOMY WITH LEFT AXILLARY SENTINEL LYMPH NODE BIOPSY;  Surgeon: Stark Klein, MD;  Location: [place];  Service: General;  Laterality: Bilateral;  . OSTOMY N/A 11/19/2014   Procedure: OSTOMY;  Surgeon: Michael [name], MD;  Location: [facility];  Service: General;  Laterality: N/A;  . PROCTOSCOPY N/A 04/01/2017   Procedure: RIDGE PROCTOSCOPY;  Surgeon: Michael [name], MD;  Location: [facility];  Service: General;  Laterality: N/A;  . REMOVAL OF BILATERAL TISSUE EXPANDERS WITH PLACEMENT OF BILATERAL BREAST IMPLANTS Bilateral 04/16/2016   Procedure: REMOVAL OF BILATERAL TISSUE EXPANDERS WITH PLACEMENT OF BILATERAL BREAST IMPLANTS;  Surgeon: Irene Limbo, MD;  Location: [facility];  Service: Plastics;  Laterality: Bilateral;  . ROBOTIC ASSISTED LAP VAGINAL HYSTERECTOMY N/A 11/19/2014   Procedure: ROBOTIC LYSIS OF ADHESIONS, CONVERTED TO LAPAROTOMY RADICAL UPPER VAGINECTOMY,LOW ANTERIOR BOWEL RESECTION, COLOSTOMY, BILATERAL URETERAL STENT PLACEMENT AND CYSTONOMY CLOSURE;  Surgeon: Everitt Amber, MD;  Location: [facility];  Service: Gynecology;  Laterality: N/A;  . TISSUE EXPANDER FILLING Bilateral 12/26/2015   Procedure: EXPANSION OF BILATERAL CHEST TISSUE EXPANDERS (60 mL- Right; 75 mL- Left);  Surgeon: Irene Limbo, MD;  Location: [facility];  Service: Plastics;  Laterality: Bilateral;  . TONSILLECTOMY    . TOTAL ABDOMINAL HYSTERECTOMY  March 2006   [facility]   and Bilateral Salpingoophorectomy/  staging for Ovarian cancer/  an  . XI ROBOTIC ASSISTED LOWER ANTERIOR RESECTION N/A 04/01/2017   Procedure: XI ROBOTIC VS LAPAROSCOPIC COLOSTOMY TAKEDOWN WITH LYSIS OF ADHESIONS.;  Surgeon: Michael [name], MD;  Location: [facility];  Service: General;  Laterality: N/A;  ERAS PATHWAY    Family History  Problem Relation Age of Onset  . Cancer Mother 76       stomach ca  . Hypertension  Mother   .  Cancer Father 27       prostate ca  . Diabetes Father   . Heart disease Father        CABG  . Breast cancer Maternal Aunt        dx in her 28s  . Lymphoma Paternal Aunt   . Brain cancer Paternal Grandfather   . Ovarian cancer Other   . Diabetes Sister   . Hypertension Brother y-10  . Heart disease Brother        CABG  . Diabetes Brother     Social History   Socioeconomic History  . Marital status: Married    Spouse name: Not on file  . Number of children: 1  . Years of education: Not on file  . Highest education level: Not on file  Occupational History  . 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 Financial resource strain: Not on file  . Food insecurity:    Worry: Not on file    Inability: Not on file  . Transportation needs:    Medical: Not on file    Non-medical: Not on file  Tobacco Use  . Smoking status: Never Smoker  . Smokeless tobacco: Never Used  Substance and Sexual Activity  . Alcohol use: Yes    Comment: rare social  . Drug use: No  . Sexual activity: Not on file  Lifestyle  . Physical activity:    Days per week: Not on file    Minutes per session: Not on file  . Stress: Not on file  Relationships  . Social connections:    Talks on phone: Not on file    Gets together: Not on file    Attends religious service: Not on file    Active member of club or organization: Not on file    Attends meetings of clubs or organizations: Not on file    Relationship status: Not on file  . Intimate partner violence:    Fear of current or ex partner: Not on file    Emotionally abused: Not on file    Physically abused: Not on file    Forced sexual activity: Not on file  Other Topics Concern  . Not on file  Social History Narrative   Exercise-- has not gotten back into it since cancer came back   CBC    Component Value Date/Time   WBC 2.6 (L) 05/02/[name] 0406   RBC 2.52 (L) 05/02/[name] 0406   HGB 7.4 (L)  05/02/[name] 0406   HGB 12.4 07/29/2017 1444   HCT 24.1 (L) 05/02/[name] 0406   HCT 38.0 07/29/2017 1444   PLT 254 05/02/[name] 0406   PLT 260 07/29/2017 1444   MCV 95.6 05/02/[name] 0406   MCV 90.9 07/29/2017 1444   MCH 29.4 05/02/[name] 0406   MCHC 30.7 05/02/[name] 0406   RDW 17.1 (H) 05/02/[name] 0406   RDW 15.5 (H) 07/29/2017 1444   LYMPHSABS 0.6 (L) 04/30/[name] 0156   LYMPHSABS 0.8 (L) 07/29/2017 1444   MONOABS 0.7 04/30/[name] 0156   MONOABS 1.0 (H) 07/29/2017 1444   EOSABS 0.2 04/30/[name] 0156   EOSABS 0.0 07/29/2017 1444   BASOSABS 0.0 04/30/[name] 0156   BASOSABS 0.0 07/29/2017 1444     Physical Exam: Blood pressure (!) 142/45, pulse 82, temperature 99.3 F (37.4 C), temperature source Oral, resp. rate 18, height _0  (1.575 m), weight 211 lb 14.4 oz (96.1 kg), SpO2 100 %. General: Well dressed, well  nourished in no apparent distress.   HEENT:  Normocephalic and atraumatic, no lesions.  Extraocular muscles intact. Sclerae anicteric. Pupils equal, round, reactive. No mouth sores or ulcers. Thyroid is normal size, not nodular, midline. Skin:  No lesions or rashes. Breasts:  deferred Lungs:  Clear to auscultation bilaterally.  No wheezes. Cardiovascular:  Regular rate and rhythm.  No murmurs or rubs. Abdomen:   Soft, somewhat tender, nondistended.  No palpable masses.  No hepatosplenomegaly.  No ascites. Normal bowel sounds.  No hernias.  Incision is healed. Ostomy appliance in situ.  Genitourinary: vaginal cuff intact without blood or fluid in the vault. Severe radiation fibrosis with indurated tissues. Normal mucosa.No palpable masses in pelvis. Rectal exam: stump in tact, no masses Extremities: No cyanosis, clubbing or edema.  No calf tenderness or erythema. No palpable cords. Psychiatric: Mood and affect are appropriate. Neurological: Awake, alert and oriented x 3. Sensation is intact, no neuropathy.  Musculoskeletal: No pain, normal strength and range of motion.  Thereasa Solo,  MD  CC: Dr Tresa Moore, Dr Alvy Bimler, Dr Theressa Millard, Dr Madelon Lips

## 2018-01-29 ENCOUNTER — Other Ambulatory Visit: Payer: Self-pay | Admitting: Hematology and Oncology

## 2018-01-29 LAB — URINE CULTURE: Culture: >=100000 — AB

## 2018-01-30 ENCOUNTER — Telehealth: Payer: Self-pay | Admitting: Gynecologic Oncology

## 2018-01-30 ENCOUNTER — Other Ambulatory Visit: Payer: Self-pay | Admitting: Gynecologic Oncology

## 2018-01-30 ENCOUNTER — Telehealth: Payer: Self-pay | Admitting: Oncology

## 2018-01-30 DIAGNOSIS — Z8543 Personal history of malignant neoplasm of ovary: Secondary | ICD-10-CM

## 2018-01-30 NOTE — Telephone Encounter (Signed)
Spoke with patient about culture results.  She states she feels "a little better, able to rest a little bit more" but knows something is wrong because she feels poorly.  Advised that Dr. Denman [name] recommendations management of her urinary infection with Dr. Tresa Moore, Dr. Hollie Salk with [name], or Dr. Terance Hart at [facility].  Patient verbalizing understanding.

## 2018-01-30 NOTE — Telephone Encounter (Signed)
Called Dr. Bishop Dublin office regarding urine culture management.  Per [employer] in her office, labs and office note were faxed to [phone] for Dr. Hollie Salk to review in the morning.

## 2018-01-31 NOTE — Telephone Encounter (Signed)
Called Dr. Bishop Dublin office again to see if she has reviewed the fax from yesterday.  The secretary said she has not reviewed it yet but is in the office today.  Asked that it is given urgent priority.

## 2018-02-01 ENCOUNTER — Telehealth: Payer: Self-pay | Admitting: Oncology

## 2018-02-01 ENCOUNTER — Ambulatory Visit (HOSPITAL_COMMUNITY)
Admission: RE | Admit: 2018-02-01 | Discharge: 2018-02-01 | Disposition: A | Discharge status: Home / Self Care | Payer: BLUE CROSS/BLUE SHIELD | Source: Ambulatory Visit | Attending: Gynecologic Oncology | Admitting: Gynecologic Oncology

## 2018-02-01 DIAGNOSIS — Z8543 Personal history of malignant neoplasm of ovary: Secondary | ICD-10-CM | POA: Diagnosis not present

## 2018-02-01 DIAGNOSIS — N1330 Unspecified hydronephrosis: Secondary | ICD-10-CM | POA: Insufficient documentation

## 2018-02-01 DIAGNOSIS — N270 Small kidney, unilateral: Secondary | ICD-10-CM | POA: Insufficient documentation

## 2018-02-01 DIAGNOSIS — Z936 Other artificial openings of urinary tract status: Secondary | ICD-10-CM | POA: Diagnosis not present

## 2018-02-01 DIAGNOSIS — N179 Acute kidney failure, unspecified: Secondary | ICD-10-CM | POA: Diagnosis not present

## 2018-02-01 DIAGNOSIS — T82594D Other mechanical complication of infusion catheter, subsequent encounter: Secondary | ICD-10-CM | POA: Diagnosis not present

## 2018-02-01 DIAGNOSIS — C541 Malignant neoplasm of endometrium: Secondary | ICD-10-CM

## 2018-02-01 DIAGNOSIS — Z9889 Other specified postprocedural states: Secondary | ICD-10-CM | POA: Insufficient documentation

## 2018-02-01 DIAGNOSIS — C569 Malignant neoplasm of unspecified ovary: Secondary | ICD-10-CM

## 2018-02-01 NOTE — Telephone Encounter (Signed)
Called [name] and [name] has an appointment with Dr. Hollie Salk on 02/13/18.  Left a message for her nurse to see if appointment can be moved up.  Requested a return call.

## 2018-02-03 ENCOUNTER — Telehealth: Payer: Self-pay | Admitting: *Deleted

## 2018-02-03 NOTE — Telephone Encounter (Signed)
Patient called in requesting the results from the CT scan that she had done on July 3rd.  I told Mrs. Bardin that Dr. Denman [name] has to review the CT scan and that it maybe Monday before she is able to get the results.  Patient would like results today if at all possible.  I explained to Mrs. Morawski that Dr. Denman [name] was not in the office today and that it still maybe Monday before she received the results.  Patient verbalized understanding.

## 2018-02-04 ENCOUNTER — Telehealth: Payer: Self-pay | Admitting: Gynecologic Oncology

## 2018-02-04 NOTE — Telephone Encounter (Signed)
Informed patient of CT findings that are unchanged from prior scans. No changes from prior imaging, therefore soft tissue in pelvis  Is most likely secondary to radiation changes and inflammatory changes from her prior surgeries.  This thickening in the pelvis has been present on imaging since completing her radiation 2 years ago. No clear explanation for the spotting that she has been demonstrating.  All questions answered.  Thereasa Solo, MD

## 2018-02-06 ENCOUNTER — Telehealth: Payer: Self-pay | Admitting: Gynecologic Oncology

## 2018-02-06 NOTE — Telephone Encounter (Signed)
Called to notify patient of CT scan results.  Patient stating she spoke with Dr. Denman [name] already about the results.  Stating she is out of her lyrica that was started in the hospital.  She states she was told by her PCP that her pain medication should be managed by Dr. Hollie Salk moving forward since she sees her frequently. The patient states she is going to contact her about a refill.  No other concerns voiced.  Advised to call for any needs.  States she is feeling better after taking the abxs prescribed by Dr. Hollie Salk.

## 2018-02-07 DIAGNOSIS — N321 Vesicointestinal fistula: Secondary | ICD-10-CM | POA: Diagnosis not present

## 2018-02-07 DIAGNOSIS — L89309 Pressure ulcer of unspecified buttock, unspecified stage: Secondary | ICD-10-CM | POA: Diagnosis not present

## 2018-02-08 ENCOUNTER — Telehealth: Payer: Self-pay | Admitting: Hematology and Oncology

## 2018-02-08 DIAGNOSIS — E860 Dehydration: Secondary | ICD-10-CM | POA: Diagnosis not present

## 2018-02-08 DIAGNOSIS — Z933 Colostomy status: Secondary | ICD-10-CM | POA: Diagnosis not present

## 2018-02-08 DIAGNOSIS — K604 Rectal fistula: Secondary | ICD-10-CM | POA: Diagnosis not present

## 2018-02-08 DIAGNOSIS — N135 Crossing vessel and stricture of ureter without hydronephrosis: Secondary | ICD-10-CM | POA: Diagnosis not present

## 2018-02-08 DIAGNOSIS — N321 Vesicointestinal fistula: Secondary | ICD-10-CM | POA: Diagnosis not present

## 2018-02-08 DIAGNOSIS — Z923 Personal history of irradiation: Secondary | ICD-10-CM | POA: Diagnosis not present

## 2018-02-08 DIAGNOSIS — T82594D Other mechanical complication of infusion catheter, subsequent encounter: Secondary | ICD-10-CM | POA: Diagnosis not present

## 2018-02-08 DIAGNOSIS — L89319 Pressure ulcer of right buttock, unspecified stage: Secondary | ICD-10-CM | POA: Diagnosis not present

## 2018-02-08 NOTE — Telephone Encounter (Signed)
Tried to call regarding date change

## 2018-02-09 DIAGNOSIS — T82594D Other mechanical complication of infusion catheter, subsequent encounter: Secondary | ICD-10-CM | POA: Diagnosis not present

## 2018-02-09 DIAGNOSIS — Z933 Colostomy status: Secondary | ICD-10-CM | POA: Diagnosis not present

## 2018-02-09 DIAGNOSIS — N182 Chronic kidney disease, stage 2 (mild): Secondary | ICD-10-CM | POA: Diagnosis not present

## 2018-02-09 DIAGNOSIS — L89319 Pressure ulcer of right buttock, unspecified stage: Secondary | ICD-10-CM | POA: Diagnosis not present

## 2018-02-10 DIAGNOSIS — L89319 Pressure ulcer of right buttock, unspecified stage: Secondary | ICD-10-CM | POA: Diagnosis not present

## 2018-02-10 DIAGNOSIS — Z933 Colostomy status: Secondary | ICD-10-CM | POA: Diagnosis not present

## 2018-02-10 DIAGNOSIS — T82594D Other mechanical complication of infusion catheter, subsequent encounter: Secondary | ICD-10-CM | POA: Diagnosis not present

## 2018-02-10 DIAGNOSIS — Z923 Personal history of irradiation: Secondary | ICD-10-CM | POA: Diagnosis not present

## 2018-02-10 DIAGNOSIS — N135 Crossing vessel and stricture of ureter without hydronephrosis: Secondary | ICD-10-CM | POA: Diagnosis not present

## 2018-02-13 DIAGNOSIS — D709 Neutropenia, unspecified: Secondary | ICD-10-CM | POA: Diagnosis not present

## 2018-02-13 DIAGNOSIS — N390 Urinary tract infection, site not specified: Secondary | ICD-10-CM | POA: Diagnosis not present

## 2018-02-13 DIAGNOSIS — N183 Chronic kidney disease, stage 3 (moderate): Secondary | ICD-10-CM | POA: Diagnosis not present

## 2018-02-14 DIAGNOSIS — T82594D Other mechanical complication of infusion catheter, subsequent encounter: Secondary | ICD-10-CM | POA: Diagnosis not present

## 2018-02-15 DIAGNOSIS — Z452 Encounter for adjustment and management of vascular access device: Secondary | ICD-10-CM | POA: Diagnosis not present

## 2018-02-15 DIAGNOSIS — N321 Vesicointestinal fistula: Secondary | ICD-10-CM | POA: Diagnosis not present

## 2018-02-15 DIAGNOSIS — T82594D Other mechanical complication of infusion catheter, subsequent encounter: Secondary | ICD-10-CM | POA: Diagnosis not present

## 2018-02-15 DIAGNOSIS — B9562 Methicillin resistant Staphylococcus aureus infection as the cause of diseases classified elsewhere: Secondary | ICD-10-CM | POA: Diagnosis not present

## 2018-02-15 DIAGNOSIS — T83592A Infection and inflammatory reaction due to indwelling ureteral stent, initial encounter: Secondary | ICD-10-CM | POA: Diagnosis not present

## 2018-02-15 DIAGNOSIS — N135 Crossing vessel and stricture of ureter without hydronephrosis: Secondary | ICD-10-CM | POA: Diagnosis not present

## 2018-02-16 ENCOUNTER — Ambulatory Visit (HOSPITAL_COMMUNITY)
Admission: RE | Admit: 2018-02-16 | Discharge: 2018-02-16 | Disposition: A | Discharge status: Home / Self Care | Payer: BLUE CROSS/BLUE SHIELD | Source: Ambulatory Visit | Attending: Urology | Admitting: Urology

## 2018-02-16 ENCOUNTER — Other Ambulatory Visit: Payer: Self-pay | Admitting: Urology

## 2018-02-16 ENCOUNTER — Encounter (HOSPITAL_COMMUNITY): Payer: Self-pay | Admitting: Interventional Radiology

## 2018-02-16 DIAGNOSIS — S3720XD Unspecified injury of bladder, subsequent encounter: Secondary | ICD-10-CM

## 2018-02-16 DIAGNOSIS — Z436 Encounter for attention to other artificial openings of urinary tract: Secondary | ICD-10-CM | POA: Insufficient documentation

## 2018-02-16 DIAGNOSIS — N135 Crossing vessel and stricture of ureter without hydronephrosis: Secondary | ICD-10-CM

## 2018-02-16 HISTORY — PX: IR NEPHROSTOMY EXCHANGE RIGHT: IMG6070

## 2018-02-16 HISTORY — PX: IR NEPHROSTOMY EXCHANGE LEFT: IMG6069

## 2018-02-16 MED ORDER — IOPAMIDOL (ISOVUE-300) INJECTION 61%
INTRAVENOUS | Status: AC
  Administered 2018-02-16: 15 mL
  Filled 2018-02-16: qty 50

## 2018-02-16 MED ORDER — LIDOCAINE HCL 1 % IJ SOLN
INTRAMUSCULAR | Status: AC
  Filled 2018-02-16: qty 20

## 2018-02-16 MED ORDER — IOPAMIDOL (ISOVUE-300) INJECTION 61%
15.0000 mL | Freq: Once | INTRAVENOUS | Status: AC | PRN
  Administered 2018-02-16: 15 mL

## 2018-02-16 MED ORDER — LIDOCAINE HCL 1 % IJ SOLN
INTRAMUSCULAR | Status: AC | PRN
  Administered 2018-02-16: 5 mL

## 2018-02-17 ENCOUNTER — Encounter: Payer: Self-pay | Admitting: Family Medicine

## 2018-02-17 ENCOUNTER — Ambulatory Visit: Payer: BLUE CROSS/BLUE SHIELD | Admitting: Family Medicine

## 2018-02-17 VITALS — BP 112/68 | HR 72 | Temp 98.4°F | Ht 62.0 in | Wt 217.6 lb

## 2018-02-17 DIAGNOSIS — G8928 Other chronic postprocedural pain: Secondary | ICD-10-CM

## 2018-02-17 DIAGNOSIS — Z79899 Other long term (current) drug therapy: Secondary | ICD-10-CM

## 2018-02-17 DIAGNOSIS — E1151 Type 2 diabetes mellitus with diabetic peripheral angiopathy without gangrene: Secondary | ICD-10-CM

## 2018-02-17 DIAGNOSIS — C55 Malignant neoplasm of uterus, part unspecified: Secondary | ICD-10-CM

## 2018-02-17 DIAGNOSIS — E669 Obesity, unspecified: Secondary | ICD-10-CM | POA: Diagnosis not present

## 2018-02-17 DIAGNOSIS — D473 Essential (hemorrhagic) thrombocythemia: Secondary | ICD-10-CM

## 2018-02-17 DIAGNOSIS — D75839 Thrombocytosis, unspecified: Secondary | ICD-10-CM

## 2018-02-17 MED ORDER — HYDROCODONE-ACETAMINOPHEN 5-325 MG PO TABS: 1.0000 | ORAL_TABLET | Freq: Four times a day (QID) | ORAL | 0 refills | Status: DC | PRN

## 2018-02-17 MED ORDER — FENTANYL 50 MCG/HR TD PT72: 50.0000 ug | MEDICATED_PATCH | TRANSDERMAL | 0 refills | Status: DC

## 2018-02-17 NOTE — Progress Notes (Signed)
Patient ID: [name], female    DOB: 26-May-1956  Age: 62 y.o. MRN: [id number]    Subjective:  Subjective  HPI [name] presents for f/u pain meds.  Pt will be have major surgery to remove bladder and colostomy / urostomy ? -- per pt -- she will have this done at [place] with gen surg , plastic surg and urology.  They asked [place] to con't the pain med  Review of Systems  Constitutional: Negative for fever.  HENT: Negative for congestion.   Respiratory: Negative for shortness of breath.   Cardiovascular: Negative for chest pain, palpitations and leg swelling.  Gastrointestinal: Negative for abdominal pain, blood in stool and nausea.  Genitourinary: Negative for dysuria and frequency.  Skin: Negative for rash.  Allergic/Immunologic: Negative for environmental allergies.  Neurological: Negative for dizziness and headaches.  Psychiatric/Behavioral: The patient is not nervous/anxious.     History Past Medical History:  Diagnosis Date  . Adrenal adenoma, left 02/08/2016   CT: stable benign  . Anemia in neoplastic disease   . Benign essential HTN   . 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 Cancer of corpus uteri, except isthmus [facility])  oncologist-- dr Denman [name] and dr Alvy Bimler    10-15-2004  dx endometroid endometrial and ovarian cancer s/p  chemotheapy and surgery(TAH w/ BSO) :  recurrent 11-19-2014 post pelvic surgery and radiation 01-29-2015 to 03-10-2015  . Chronic idiopathic neutropenia (HCC)    presumed related to chemotherapy March 2006--- followed by dr Alvy Bimler (treatment w/ G-CSF injections  . Chronic nausea   . Chronic pain    perineal/ anal  area from bladder pad irritates skin , right flank pain  . CKD stage G2/A3, GFR 60-89 and albumin creatinine ratio >300 mg/g    nephrologist-  dr  Madelon Lips  . Diabetic retinopathy, background ([place])   . Difficult intravenous access    small veins--- hx PICC lines  . DM type 2 (diabetes mellitus, type 2) ([place])    monitored by dr Legrand Como altheimer  . Dysuria   . Environmental and seasonal allergies   . Fatty liver 02/08/2016   CT  . Generalized muscle weakness   . GERD (gastroesophageal reflux disease)   . Hiatal hernia   . History of abdominal abscess 04/16/2017   post surgery 04-01-2017  --- resolved 10/ 2018  . History of gastric polyp    2014  duodenum  . History of ileus 04/16/2017   resolved w/ no surgical intervention  . History of radiation therapy    01-29-2015 to 03-10-2015  pelvis 50.4Gy  . Hypothyroidism    monitored by dr Legrand Como altheimer  . Ileostomy in place [facility]) 04/01/2017   created at same time colostomy takedown.  . Lower urinary tract symptoms (LUTS)    urge urinary  incontinence  . Mixed dyslipidemia   . Multiple thyroid nodules    Managed by Dr. Harlow Asa  . Pelvic abscess in female 04/16/2017  . PONV (postoperative nausea and vomiting)    "scopolamine patch works for me"  . Radiation-induced dermatitis    contact dermatitis , radiation completed, rash only on ankles now.  . Seasonal allergies   . Ureteral stricture, right UROLOGIT-  DR [facility]   CHRONIC--  TREATMENT URETERAL STENT  . 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 Vitamin  D deficiency   . Wears glasses     She has a past surgical history that includes Appendectomy; Tonsillectomy; Colonoscopy with propofol (N/A, 08/21/2013); EUS (N/A, 10/02/2014); Robotic assisted lap vaginal hysterectomy (N/A, 11/19/2014); Ostomy (N/A, 11/19/2014); Colostomy takedown (N/A, 12/04/2014); Cystoscopy with retrograde pyelogram, ureteroscopy and stent placement (Right, 03/20/2015); EXCISION SOFT TISSUE MASS RIGHT FOREMAN (12-08-2006); Cystoscopy with retrograde pyelogram, ureteroscopy and stent placement (Right, 05/02/2015); Cystoscopy with retrograde pyelogram,  ureteroscopy and stent placement (Right, 09/05/2015); Cystoscopy w/ retrogrades (Right, 11/21/2015); Cystoscopy w/ ureteral stent placement (Right, 11/21/2015); Mastectomy w/ sentinel node biopsy (Bilateral, 12/11/2015); Breast reconstruction with placement of tissue expander and flex hd (acellular hydrated dermis) (Bilateral, 12/11/2015); Incision and drainage of wound (Bilateral, 12/26/2015); Tissue expander filling (Bilateral, 12/26/2015); Laparoscopic cholecystectomy (1990); Eye surgery (as child); Cystoscopy w/ ureteral stent placement (Right, 03/10/2016); Removal of bilateral tissue expanders with placement of bilateral breast implants (Bilateral, 04/16/2016); Liposuction with lipofilling (Bilateral, 04/16/2016); Cystoscopy w/ ureteral stent placement (Right, 06/30/2016); Cystoscopy with stent placement (Right, 10/27/2016); XI robotic assisted lower anterior resection (N/A, 04/01/2017); Proctoscopy (N/A, 04/01/2017); Cystoscopy with retrograde pyelogram, ureteroscopy and stent placement (Right, 04/01/2017); IR US Guide Vasc Access Right (04/06/2017); IR Fluoro Guide CV Line Right (04/06/2017); IR US Guide Vasc Access Right (05/30/2017); IR Fluoro Guide CV Midline PICC Right (05/30/2017); IR Fluoro Guide CV Line Left (05/31/2017); IR US Guide Vasc Access Left (05/31/2017); IR CV Line Injection (05/31/2017); IR Radiologist Eval & Mgmt (05/03/2017); Cystoscopy w/ ureteral stent placement (N/A, 06/01/2017); Cystogram (N/A, 06/01/2017); Total abdominal hysterectomy (March 2006   [facility]); Cystoscopy w/ ureteral stent placement (Right, 08/17/2017); IR NEPHROSTOGRAM RIGHT INITIAL PLACEMENT (09/02/2017); IR NEPHROSTOGRAM LEFT INITIAL PLACEMENT (09/02/2017); IR NEPHROSTOGRAM RIGHT THRU EXISTING ACCESS (09/13/2017); IR NEPHROSTOMY PLACEMENT LEFT (10/02/2017); IR NEPHROSTOMY EXCHANGE RIGHT (10/02/2017); IR NEPHROSTOMY EXCHANGE RIGHT (11/28/2017); IR NEPHROSTOMY EXCHANGE LEFT (11/28/2017); IR NEPHROSTOGRAM LEFT THRU EXISTING ACCESS (11/29/2017); IR  NEPHROSTOGRAM RIGHT THRU EXISTING ACCESS (11/29/2017); IR NEPHROSTOMY EXCHANGE LEFT (01/05/2018); IR NEPHROSTOMY EXCHANGE RIGHT (01/05/2018); IR NEPHROSTOMY EXCHANGE RIGHT (02/16/2018); and IR NEPHROSTOMY EXCHANGE LEFT (02/16/2018).   Her family history includes Brain cancer in her paternal grandfather; Breast cancer in her maternal aunt; Cancer (age of onset: 53) in her mother; Cancer (age of onset: 56) in her father; Diabetes in her brother, father, and sister; Heart disease in her brother and father; Hypertension in her mother; Hypertension (age of onset: y-10) in her brother; Lymphoma in her paternal aunt; Ovarian cancer in her other.She reports that she has never smoked. She has never used smokeless tobacco. She reports that she drinks alcohol. She reports that she does not use drugs.  Current Outpatient Medications on File Prior to Visit  Medication Sig Dispense Refill  . acetaminophen (TYLENOL) 325 MG tablet Take 2 tablets (650 mg total) by mouth every 6 (six) hours as needed. (Patient taking differently: Take 650 mg by mouth every 6 (six) hours as needed for mild pain. ) 30 tablet 1  . anastrozole (ARIMIDEX) 1 MG tablet TAKE 1 TABLET DAILY 90 tablet 3  . Biotin 5 MG TABS Take 5 mg by mouth every morning.     . Cholecalciferol (VITAMIN D3) 10000 UNITS capsule Take 10,000 Units by mouth once a week. Sunday evening's    . ciprofloxacin (CIPRO) 500 MG tablet Take 1 tablet by mouth 2 (two) times daily.    . collagenase (SANTYL) ointment Apply 1 application topically daily as needed (skin care).     . diphenhydrAMINE (BENADRYL) 25 mg capsule Take 1 capsule (25 mg total) by mouth every  8 (eight) hours as needed for itching, allergies or sleep. 30 capsule 0  . diphenoxylate-atropine (LOMOTIL) 2.5-0.025 MG tablet Take 1 tablet by mouth 4 (four) times daily as needed for diarrhea or loose stools. TO PREVENT LOOSE BOWEL MOVEMENTS (Patient taking differently: Take 1 tablet by mouth every other day. TO PREVENT  LOOSE BOWEL MOVEMENTS) 30 tablet 0  . ferrous sulfate 325 (65 FE) MG tablet Take 1 tablet (325 mg total) by mouth at bedtime. 30 tablet 3  . heparin lock flush 100 UNIT/ML SOLN injection Inject 500 Units into the vein daily.    . insulin glargine (LANTUS) 100 UNIT/ML injection Inject 14 Units into the skin 2 (two) times daily.    [name] Kitchen JANUVIA 50 MG tablet     . levothyroxine (SYNTHROID, LEVOTHROID) 150 MCG tablet Take 1 tablet (150 mcg total) by mouth daily before breakfast. 30 tablet 1  . loperamide (IMODIUM) 2 MG capsule Take 1-2 capsules (2-4 mg total) by mouth every 8 (eight) hours as needed for diarrhea or loose stools (Use if >2 BM every 8 hours). 30 capsule 0  . loratadine (CLARITIN) 10 MG tablet Take 10 mg by mouth every morning.     [name] Kitchen MAGNESIUM SULFATE IV Inject 2 g into the vein every Wednesday. Given in 1L 0.9% NS by [facility]    . nitrofurantoin, macrocrystal-monohydrate, (MACROBID) 100 MG capsule Take by mouth.    . omega-3 acid ethyl esters (LOVAZA) 1 G capsule Take 1 g by mouth 2 (two) times daily.     Vladimir Faster Glycol-Propyl Glycol (SYSTANE OP) Place 1 drop into both eyes daily as needed (dry eyes).     . pregabalin (LYRICA) 50 MG capsule Take 1 capsule (50 mg total) by mouth 2 (two) times daily. 180 capsule 1  . Prenatal Vit-Fe Fumarate-FA (PRENATAL VITAMIN PO) Take 1 capsule by mouth daily. Takes prenatal because there are no dyes in it    . protein supplement shake (PREMIER PROTEIN) LIQD Take 325 mLs (11 oz total) by mouth 2 (two) times daily as needed (Pt to request). 60 Can 1  . ranitidine (ZANTAC) 150 MG tablet Take 1 tablet (150 mg total) by mouth 2 (two) times daily as needed for heartburn. (Patient taking differently: Take 150 mg by mouth at bedtime. ) 60 tablet 1  . rosuvastatin (CRESTOR) 10 MG tablet Take 10 mg by mouth every evening.     . Saccharomyces boulardii (FLORASTOR PO) Take 1 capsule by mouth daily.    . sodium chloride 0.9 % 1,000 mL Inject 500 mLs into the vein once  a week. On Wednesday with magnesium    . sodium chloride 0.9 % injection Inject 10 mLs into the vein daily.    . Tbo-Filgrastim (GRANIX) 480 MCG/0.8ML SOSY injection Inject 0.8 mLs (480 mcg total) into the skin daily. 30 Syringe 11  . insulin aspart (NOVOLOG) 100 UNIT/ML injection Inject 4 Units into the skin 3 (three) times daily with meals.    [name] Kitchen MONUROL 3 g PACK USE 1 PACKET AS DIRECTED Q 48 H FOR 3 DOES  0  . nitrofurantoin, macrocrystal-monohydrate, (MACROBID) 100 MG capsule Take 1 capsule by mouth daily.     No current facility-administered medications on file prior to visit.      Objective:  Objective  Physical Exam  Constitutional: She is oriented to person, place, and time. She appears well-developed and well-nourished.  HENT:  Head: Normocephalic and atraumatic.  Eyes: Conjunctivae and EOM are normal.  Neck: Normal range of  motion. Neck supple. No JVD present. Carotid bruit is not present. No thyromegaly present.  Cardiovascular: Normal rate, regular rhythm and normal heart sounds.  No murmur heard. Pulmonary/Chest: Effort normal and breath sounds normal. No respiratory distress. She has no wheezes. She has no rales. She exhibits no tenderness.  Musculoskeletal: She exhibits no edema.  Neurological: She is alert and oriented to person, place, and time.  Psychiatric: She has a normal mood and affect.  Nursing note and vitals reviewed.  BP 112/68 (BP Location: Right Arm, Patient Position: Sitting, Cuff Size: Normal)   Pulse 72   Temp 98.4 F (36.9 C) (Oral)   Ht 5' 2"  (1.575 m)   Wt 217 lb 9.6 oz (98.7 kg)   SpO2 93%   BMI 39.80 kg/m  Wt Readings from Last 3 Encounters:  02/17/18 217 lb 9.6 oz (98.7 kg)  01/27/18 211 lb 14.4 oz (96.1 kg)  11/30/17 219 lb 12.8 oz (99.7 kg)     Lab Results  Component Value Date   WBC 2.6 (L) 12/01/2017   HGB 7.4 (L) 12/01/2017   HCT 24.1 (L) 12/01/2017   PLT 254 12/01/2017   GLUCOSE 113 (H) 01/27/2018   CHOL 109 08/28/2015    TRIG 89 04/18/2017   HDL 43.80 08/28/2015   LDLCALC 45 08/28/2015   ALT 25 11/29/2017   AST 16 11/29/2017   NA 140 01/27/2018   K 4.0 01/27/2018   CL 108 01/27/2018   CREATININE 1.81 (H) 01/27/2018   BUN 31 (H) 01/27/2018   CO2 24 01/27/2018   TSH 2.408 08/30/2017   INR 1.02 11/29/2017   HGBA1C 7.9 (H) 09/01/2017   MICROALBUR 5.6 (H) 08/30/2016    Ir Nephrostomy Exchange Left  Result Date: 02/16/2018 INDICATION: Need for exchange of indwelling bilateral percutaneous nephrostomy tubes. EXAM: BILATERAL PERCUTANEOUS NEPHROSTOMY TUBE EXCHANGE COMPARISON:  01/05/2018 MEDICATIONS: None ANESTHESIA/SEDATION: None CONTRAST:  15 mL Isovue-300-administered into the collecting system(s) FLUOROSCOPY TIME:  Fluoroscopy Time: 2 minutes and 6 seconds. 55.8 mGy. COMPLICATIONS: None immediate. PROCEDURE: Informed written consent was obtained from the patient after a thorough discussion of the procedural risks, benefits and alternatives. All questions were addressed. Maximal Sterile Barrier Technique was utilized including caps, mask, sterile gowns, sterile gloves, sterile drape, hand hygiene and skin antiseptic. A timeout was performed prior to the initiation of the procedure. Both nephrostomy tubes were injected with contrast material, cut and removed over guidewires. New 12 French nephrostomy tubes were advanced over guidewires and formed. Both were formed at the level of the renal pelvis. Fluoroscopic spot images were obtained after injection of contrast. Both were secured with 0 Prolene retention sutures and adhesive retention devices. Both catheters were connected to new gravity drainage bags. IMPRESSION: Routine exchange of chronic indwelling bilateral 12 French percutaneous nephrostomy tubes. Electronically Signed   By: Aletta Edouard M.D.   On: 02/16/2018 11:07   Ir Nephrostomy Exchange Right  Result Date: 02/16/2018 INDICATION: Need for exchange of indwelling bilateral percutaneous nephrostomy tubes.  EXAM: BILATERAL PERCUTANEOUS NEPHROSTOMY TUBE EXCHANGE COMPARISON:  01/05/2018 MEDICATIONS: None ANESTHESIA/SEDATION: None CONTRAST:  15 mL Isovue-300-administered into the collecting system(s) FLUOROSCOPY TIME:  Fluoroscopy Time: 2 minutes and 6 seconds. 55.8 mGy. COMPLICATIONS: None immediate. PROCEDURE: Informed written consent was obtained from the patient after a thorough discussion of the procedural risks, benefits and alternatives. All questions were addressed. Maximal Sterile Barrier Technique was utilized including caps, mask, sterile gowns, sterile gloves, sterile drape, hand hygiene and skin antiseptic. A timeout was performed prior to the  initiation of the procedure. Both nephrostomy tubes were injected with contrast material, cut and removed over guidewires. New 12 French nephrostomy tubes were advanced over guidewires and formed. Both were formed at the level of the renal pelvis. Fluoroscopic spot images were obtained after injection of contrast. Both were secured with 0 Prolene retention sutures and adhesive retention devices. Both catheters were connected to new gravity drainage bags. IMPRESSION: Routine exchange of chronic indwelling bilateral 12 French percutaneous nephrostomy tubes. Electronically Signed   By: Aletta Edouard M.D.   On: 02/16/2018 11:07     Assessment & Plan:  Plan  I have discontinued Desia B. Pridgeon's psyllium. I am also having her start on HYDROcodone-acetaminophen. Additionally, I am having her maintain her rosuvastatin, omega-3 acid ethyl esters, Vitamin D3, Polyethyl Glycol-Propyl Glycol (SYSTANE OP), Biotin, loratadine, Prenatal Vit-Fe Fumarate-FA (PRENATAL VITAMIN PO), acetaminophen, diphenhydrAMINE, diphenoxylate-atropine, ferrous sulfate, levothyroxine, loperamide, protein supplement shake, ranitidine, collagenase, pregabalin, Tbo-Filgrastim, insulin glargine, (sodium chloride 0.9 % 1,000 mL), MAGNESIUM SULFATE IV, heparin lock flush, sodium chloride, nitrofurantoin  (macrocrystal-monohydrate), JANUVIA, anastrozole, fentaNYL, ciprofloxacin, Saccharomyces boulardii (FLORASTOR PO), nitrofurantoin (macrocrystal-monohydrate), insulin aspart, and MONUROL.  Meds ordered this encounter  Medications  . HYDROcodone-acetaminophen (NORCO/VICODIN) 5-325 MG tablet    Sig: Take 1 tablet by mouth every 6 (six) hours as needed for moderate pain.    Dispense:  30 tablet    Refill:  0  . fentaNYL (DURAGESIC) 50 MCG/HR    Sig: Place 1 patch (50 mcg total) onto the skin every 3 (three) days.    Dispense:  10 patch    Refill:  0    Problem List Items Addressed This Visit    None    Visit Diagnoses    Other chronic postprocedural pain    -  Primary   Relevant Medications   HYDROcodone-acetaminophen (NORCO/VICODIN) 5-325 MG tablet   Other Relevant Orders   Pain Mgmt, Profile 8 w/Conf, U   DM (diabetes mellitus) type II, controlled, with peripheral vascular disorder (HCC)   (Chronic)     Relevant Medications   insulin aspart (NOVOLOG) 100 UNIT/ML injection   Thrombocytosis (HCC)   (Chronic)     Obesity (BMI 30-39.9)       Relevant Medications   insulin aspart (NOVOLOG) 100 UNIT/ML injection   Other Relevant Orders   Amb Ref to Medical Weight Management   Malignant neoplasm of uterus, unspecified site [facility])       Relevant Medications   fentaNYL (DURAGESIC) 50 MCG/HR   ciprofloxacin (CIPRO) 500 MG tablet   High risk medication use       Relevant Orders   Pain Mgmt, Profile 8 w/Conf, U      Follow-up: Return in about 3 months (around 05/20/2018).  Ann Held, DO

## 2018-02-17 NOTE — Patient Instructions (Signed)
Fentanyl skin patch What is this medicine? FENTANYL (FEN ta nil) is a pain reliever. It is used to treat persistent, moderate to severe chronic pain. It is used only by people who have been taking an opioid or narcotic pain medicine for more than one week. This medicine may be used for other purposes; ask your health care provider or pharmacist if you have questions. COMMON BRAND NAME(S): Duragesic What should I tell my health care provider before I take this medicine? They need to know if you have any of these conditions: -brain tumor -Crohn's disease, inflammatory bowel disease, or ulcerative colitis -drug abuse or addiction -head injury -heart disease -if you frequently drink alcohol containing drinks -kidney disease or problems going to the bathroom -liver disease -lung disease, asthma, or breathing problems -mental problems -skin problems -taken isocarboxazid, phenelzine, tranylcypromine, or selegiline in the past 2 weeks -an allergic or unusual reaction to fentanyl, meperidine, other medicines, foods, dyes, or preservatives -pregnant or trying to get pregnant -breast-feeding How should I use this medicine? Apply the patch to your skin. Do not cut or damage the patch. A cut or damaged patch can be very dangerous because you may get too much medicine. Select a clean, dry area of skin above your waist on your front or back. The upper back is a good spot to put the patch on children or people who are confused because it will be hard for them to remove the patch. Do not apply the patch to oily, broken, burned, cut, or irritated skin. Use only water to clean the area. Do not use soap or alcohol to clean the skin because this can increase the effects of the medicine. If the area is hairy, clip the hair with scissors, but do not shave. Take the patch out of its wrapper, and take off the protective strip over the sticky part. Do not use a patch if the packaging or backing is damaged. Do not  touch the sticky part with your fingers. Press the sticky surface to the skin using the palm of your hand. Press the patch to the skin for 30 seconds. Wash your hands at once with soap and water. Keep patches far away from children. Do not let children see you apply the patch and do not apply it where children can see it. Do not call the patch a sticker, tattoo, or bandage, as this could encourage the child to mimic your actions. Used patches still contain medicine. Children or pets can have serious side effects or die from putting used patches in their mouth or on their bodies. Take off the old patch before putting on a new patch. Apply each new patch to a different area of skin. If a patch comes off or causes irritation, remove it and apply a new patch to different site. If the edges of the patch start to loosen, first apply first aid tape to the edges of the patch. If problems with the patch not sticking continue, cover the patch with a see-through adhesive dressing (like Bioclusive or Tegaderm). Never cover the patch with any other bandage or tape. To get rid of used patches, fold the patch in half with the sticky sides together. Then, flush it down the toilet. Do not discard the patch in the garbage. Pets and children can be harmed if they find used or lost patches. Replace the patch every 3 days or as directed by your doctor or health care professional. Follow the directions on the prescription label. Do  not take more medicine than you are told to take. A special MedGuide will be given to you by the pharmacist with each prescription and refill. Be sure to read this information carefully each time. Talk to your pediatrician regarding the use of this medicine in children. While this drug may be prescribed for children as young as 27 years old for selected conditions, precautions do apply. If someone accidentally uses a fentanyl patch and is not awake and alert, immediately call 911 for help. If the person  is awake and alert, call a doctor, health care professional, or the [employer]. Overdosage: If you think you have taken too much of this medicine contact a poison control center or emergency room at once. NOTE: This medicine is only for you. Do not share this medicine with others. What if I miss a dose? If you forget to replace your patch, take off the old patch and put on a new patch as soon as you can. Do not apply an extra patch to your skin. Do not wear more than one patch at the same time unless told to do so by your doctor or health care professional. What may interact with this medicine? Do not take this medication with any of the following medicines: -mifepristone This medicine may also interact with the following medications: -alcohol -antihistamines for allergy, cough and cold -antiviral medicines for HIV or AIDS -aprepitant -atropine -certain antibiotics like erythromycin and clarithromycin -certain medicines for anxiety or sleep -certain medicines for bladder problems like oxybutynin, tolterodine -certain medicines for blood pressure, heart disease, irregular heart beat -certain medicines for depression like amitriptyline, fluoxetine, sertraline -certain medicines for diabetes like pioglitazone, troglitazone -certain medicines for fungal infections like ketoconazole and itraconazole -certain medicines for seizures like phenobarbital, phenytoin, primidone -certain medicines for stomach problems like dicyclomine, hyoscyamine -certain medicines for travel sickness like scopolamine -certain medicines for Parkinson's disease like benztropine, trihexyphenidyl -cimetidine -general anesthetics like halothane, isoflurane, methoxyflurane, propofol -grapefruit juice -ipratropium -local anesthetics like lidocaine, pramoxine, tetracaine -MAOIs like Carbex, Eldepryl, Marplan, Nardil, and Parnate -medicines that relax muscles for surgery -other narcotic medicines for pain or  cough -phenothiazines like chlorpromazine, mesoridazine, prochlorperazine, thioridazine -rifampin -St. John's wort -steroid medicines like prednisone or cortisone This list may not describe all possible interactions. Give your health care provider a list of all the medicines, herbs, non-prescription drugs, or dietary supplements you use. Also tell them if you smoke, drink alcohol, or use illegal drugs. Some items may interact with your medicine. What should I watch for while using this medicine? Tell your doctor or health care professional if your pain does not go away, if it gets worse, or if you have new or a different type of pain. You may develop tolerance to the medicine. Tolerance means that you will need a higher dose of the medicine for pain relief. Tolerance is normal and is expected if you take the medicine for a long time. Do not suddenly stop taking your medicine because you may develop a severe reaction. Your body becomes used to the medicine. This does NOT mean you are addicted. Addiction is a behavior related to getting and using a drug for a non-medical reason. If you have pain, you have a medical reason to take pain medicine. Your doctor will tell you how much medicine to take. If your doctor wants you to stop the medicine, the dose will be slowly lowered over time to avoid any side effects. There are different types of narcotic medicines (opiates).  If you take more than one type at the same time or you are taking another medicine that also causes drowsiness, you may have more side effects. Give your health care provider a list of all medicines you use. Your doctor will tell you how much medicine to take. Do not take more medicine than directed. Call emergency for help if you have problems breathing or unusual sleepiness. This medicine patch is sensitive to certain body heat changes. If your skin gets too hot, more medicine will come out of the patch and can cause a deadly overdose. Call  your healthcare provider if you get a fever. Do not take hot baths. Do not sunbathe. Do not use hot tubs, saunas, hair dryers, heating pads, electric blankets, heated waterbeds, or tanning lamps. Do not do exercise that increases your body temperature. If you are going to need surgery, a MRI, CT scan, or other procedure, tell your doctor that you are using this medicine. You may need to remove this patch before the procedure. You may get drowsy or dizzy. Do not drive, use machinery, or do anything that may be dangerous until you know how the medicine affects you. Do not stand or sit up quickly, especially if you are an older patient. This reduces the risk of dizzy or fainting spells. Alcohol may interfere with the effect of this medicine. Avoid alcoholic drinks. The medicine will cause constipation. Try to have a bowel movement at least every 2 to 3 days. If you do not have a bowel movement for 3 days, call your doctor or health care professional. Your mouth may get dry. Chewing sugarless gum or sucking hard candy, and drinking plenty of water may help. Contact your doctor if the problem does not go away or is severe. What side effects may I notice from receiving this medicine? Side effects that you should report to your doctor or health care professional as soon as possible: -allergic reactions like skin rash, itching or hives, swelling of the face, lips, or tongue -breathing problems -confusion -signs and symptoms of low blood pressure like dizziness; feeling faint or lightheaded, falls; unusually weak or tired -trouble passing urine or change in the amount of urine Side effects that usually do not require medical attention (report to your doctor or health care professional if they continue or are bothersome): -constipation -dry mouth -itching at the site where the patch was applied -nausea, vomiting -tiredness This list may not describe all possible side effects. Call your doctor for medical  advice about side effects. You may report side effects to FDA at 1-800-FDA-1088. Where should I keep my medicine? Keep out of the reach of children. This medicine can be abused. Keep your medicine in a safe place to protect it from theft. Do not share this medicine with anyone. Selling or giving away this medicine is dangerous and against the law. Store below 77 degrees F (25 degrees C). Do not store the patches out of their wrappers. This medicine may cause accidental overdose and death if it is taken by other adults, children, or pets. Flush any unused medicine down the toilet as instructed above to reduce the chance of harm. Do not use the medicine after the expiration date. NOTE: This sheet is a summary. It may not cover all possible information. If you have questions about this medicine, talk to your doctor, pharmacist, or health care provider.  2018 Elsevier/Gold Standard (2016-03-03 15:37:11)

## 2018-02-20 ENCOUNTER — Other Ambulatory Visit: Payer: Self-pay | Admitting: Family Medicine

## 2018-02-20 DIAGNOSIS — G8928 Other chronic postprocedural pain: Secondary | ICD-10-CM

## 2018-02-20 DIAGNOSIS — C55 Malignant neoplasm of uterus, part unspecified: Secondary | ICD-10-CM

## 2018-02-21 ENCOUNTER — Telehealth: Payer: Self-pay | Admitting: Family Medicine

## 2018-02-21 ENCOUNTER — Other Ambulatory Visit: Payer: Self-pay | Admitting: Family Medicine

## 2018-02-21 DIAGNOSIS — K632 Fistula of intestine: Secondary | ICD-10-CM | POA: Diagnosis not present

## 2018-02-21 DIAGNOSIS — N135 Crossing vessel and stricture of ureter without hydronephrosis: Secondary | ICD-10-CM | POA: Diagnosis not present

## 2018-02-21 DIAGNOSIS — C541 Malignant neoplasm of endometrium: Secondary | ICD-10-CM | POA: Diagnosis not present

## 2018-02-21 DIAGNOSIS — G8928 Other chronic postprocedural pain: Secondary | ICD-10-CM

## 2018-02-21 MED ORDER — HYDROCODONE-ACETAMINOPHEN 5-325 MG PO TABS: 1.0000 | ORAL_TABLET | Freq: Four times a day (QID) | ORAL | 0 refills | Status: DC | PRN

## 2018-02-21 MED ORDER — FENTANYL 50 MCG/HR TD PT72: 50.0000 ug | MEDICATED_PATCH | TRANSDERMAL | 0 refills | Status: DC

## 2018-02-21 NOTE — Telephone Encounter (Signed)
Pt requesting refill on fentanyl and hydrocodone.   Last OV: 02/17/2018 Last Fill on fentanyl: 02/17/2018 #10 patch and 0RF Last Fill on hydrocodone: 02/17/2018 #30 and 1QE UDS: 02/17/2018 Results pending  CSC: Don't see one

## 2018-02-21 NOTE — Telephone Encounter (Signed)
I did those when she was here

## 2018-02-21 NOTE — Telephone Encounter (Signed)
What orders?

## 2018-02-21 NOTE — Telephone Encounter (Signed)
Copied from [place] [phone]. Topic: General - Other >> Feb 21, 2018 11:12 AM Valla Leaver wrote: Reason for CRM: Penni Bombard, PT with [facility] calling to request Dr. Etter Sjogren to sign off on old orders from March 2019? Please advise.

## 2018-02-21 NOTE — Telephone Encounter (Signed)
Who was it sent to ?

## 2018-02-21 NOTE — Telephone Encounter (Signed)
Pt states she only has 1 fentanyl patch left- and hydrocodone was actually list as no print- wasn't refilled on 02/17/2018.

## 2018-02-21 NOTE — Telephone Encounter (Signed)
Called Jenine with PT. States orders were originally sent to another provider who refused to sign them and suggested they send everything to you. States the will re-send. Patient has already received Therapy

## 2018-02-22 ENCOUNTER — Encounter: Payer: Self-pay | Admitting: Family Medicine

## 2018-02-22 DIAGNOSIS — Z9013 Acquired absence of bilateral breasts and nipples: Secondary | ICD-10-CM | POA: Diagnosis not present

## 2018-02-22 DIAGNOSIS — D709 Neutropenia, unspecified: Secondary | ICD-10-CM | POA: Diagnosis not present

## 2018-02-22 DIAGNOSIS — Z853 Personal history of malignant neoplasm of breast: Secondary | ICD-10-CM | POA: Diagnosis not present

## 2018-02-22 DIAGNOSIS — Z932 Ileostomy status: Secondary | ICD-10-CM | POA: Diagnosis not present

## 2018-02-22 DIAGNOSIS — E860 Dehydration: Secondary | ICD-10-CM | POA: Diagnosis not present

## 2018-02-22 NOTE — Telephone Encounter (Signed)
Called [facility]  Back. States they did not send yesterday and will send it today to my attention.

## 2018-02-23 ENCOUNTER — Telehealth: Payer: Self-pay | Admitting: *Deleted

## 2018-02-23 DIAGNOSIS — Z853 Personal history of malignant neoplasm of breast: Secondary | ICD-10-CM | POA: Diagnosis not present

## 2018-02-23 DIAGNOSIS — Z9013 Acquired absence of bilateral breasts and nipples: Secondary | ICD-10-CM | POA: Diagnosis not present

## 2018-02-23 DIAGNOSIS — D709 Neutropenia, unspecified: Secondary | ICD-10-CM | POA: Diagnosis not present

## 2018-02-23 NOTE — Telephone Encounter (Signed)
Received Lab Report results from [facility]; forwarded to provider/SLS 07/25

## 2018-02-23 NOTE — Telephone Encounter (Signed)
Received Physician Orders from [facility]; forwarded to provider/SLS 07/25

## 2018-02-24 ENCOUNTER — Telehealth: Payer: Self-pay | Admitting: *Deleted

## 2018-02-24 DIAGNOSIS — Z933 Colostomy status: Secondary | ICD-10-CM | POA: Diagnosis not present

## 2018-02-24 LAB — PAIN MGMT, PROFILE 8 W/CONF, U
6 ACETYLMORPHINE: NEGATIVE ng/mL (ref <10)
AMPHETAMINES: NEGATIVE ng/mL (ref <500)
Alcohol Metabolites: NEGATIVE ng/mL (ref <500)
BENZODIAZEPINES: NEGATIVE ng/mL (ref <100)
BUPRENORPHINE, URINE: NEGATIVE ng/mL (ref <5)
CODEINE: NEGATIVE ng/mL (ref <50)
Cocaine Metabolite: NEGATIVE ng/mL (ref <150)
Creatinine: 79.4 mg/dL
HYDROCODONE: 878 ng/mL — AB (ref <50)
HYDROMORPHONE: 68 ng/mL — AB (ref <50)
MDMA: NEGATIVE ng/mL (ref <500)
Marijuana Metabolite: NEGATIVE ng/mL (ref <20)
Morphine: NEGATIVE ng/mL (ref <50)
NORHYDROCODONE: 375 ng/mL — AB (ref <50)
OXYCODONE: NEGATIVE ng/mL (ref <100)
Opiates: POSITIVE ng/mL — AB (ref <100)
Oxidant: NEGATIVE ug/mL (ref <200)
pH: 7.88 (ref 4.5–9.0)

## 2018-02-24 NOTE — Telephone Encounter (Signed)
Received Plan of Care Update, informational only for review  from [facility]; forwarded to provider/SLS 07/26

## 2018-02-26 IMAGING — US US RENAL
1 series · 14 of 25 positions shown · non-contrast
Comparison: No prior renal ultrasound. CT the abdomen and pelvis
08/25/2017.

CLINICAL DATA: 62-year-old female with history of acute renal
failure. History of ureteral stricture.

EXAM:
RENAL / URINARY TRACT ULTRASOUND COMPLETE

[Series 1: us renal · 0.23mm/px · 14 of 65 slices shown]
[im 1/65]
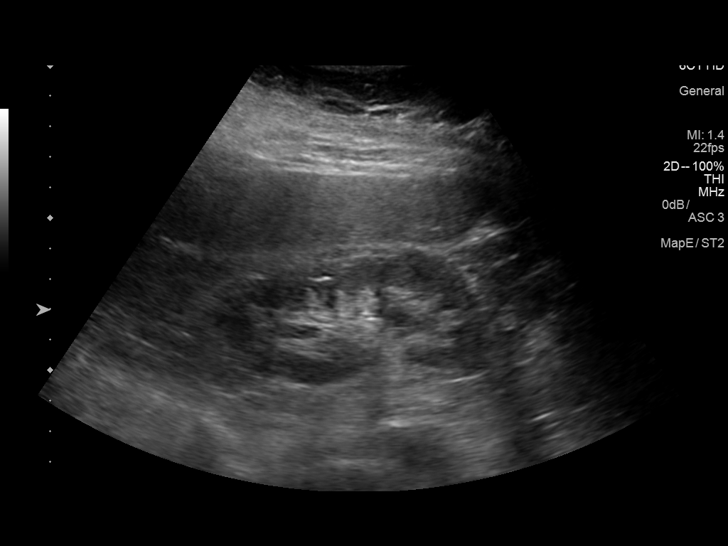
[im 6/65]
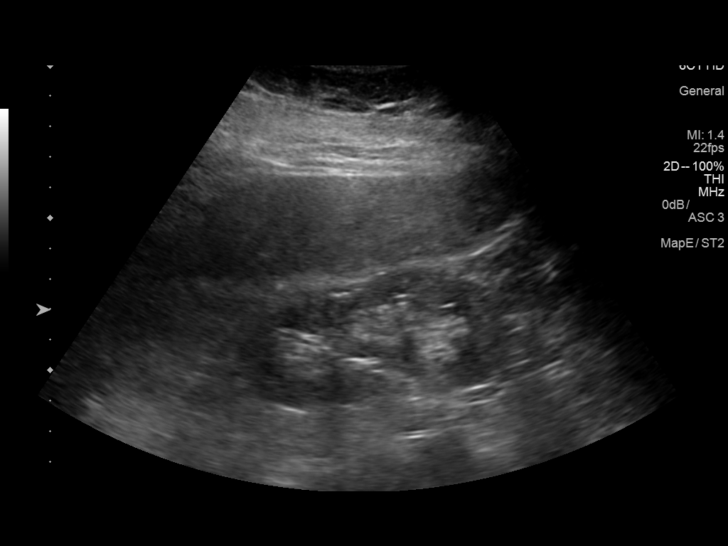
[im 11/65]
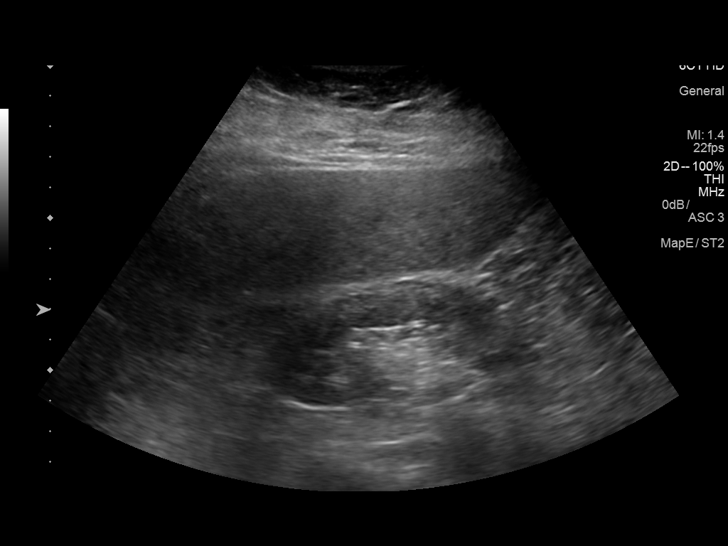
[im 17/65]
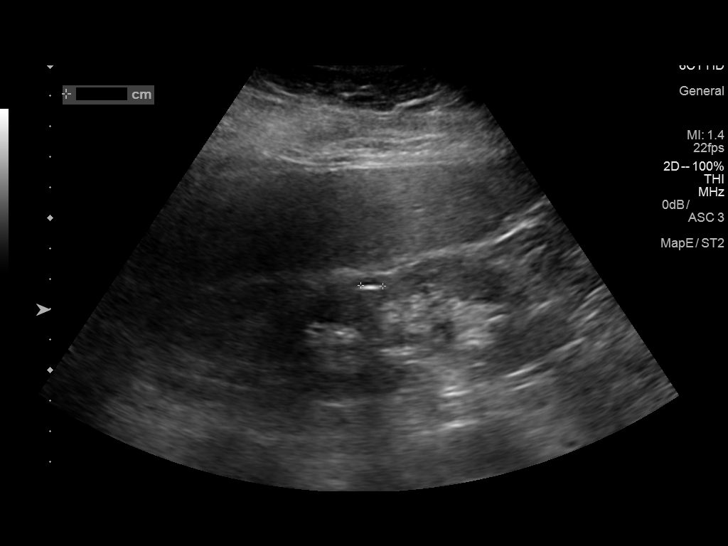
[im 22/65]
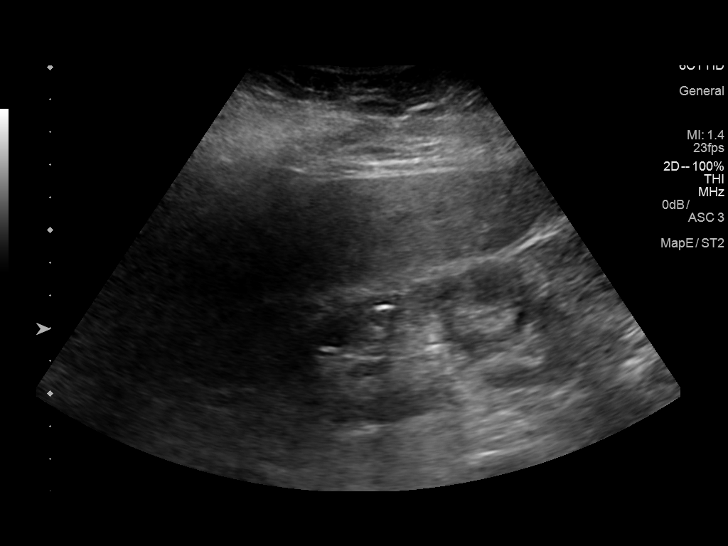
[im 25/65]
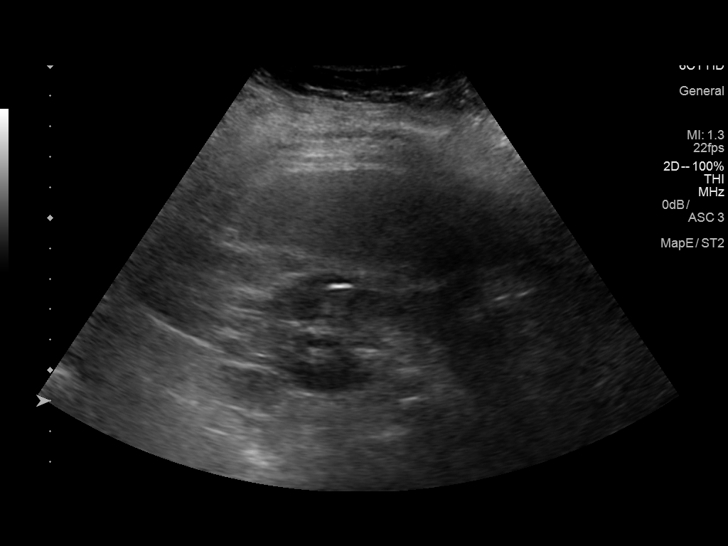
[im 30/65]
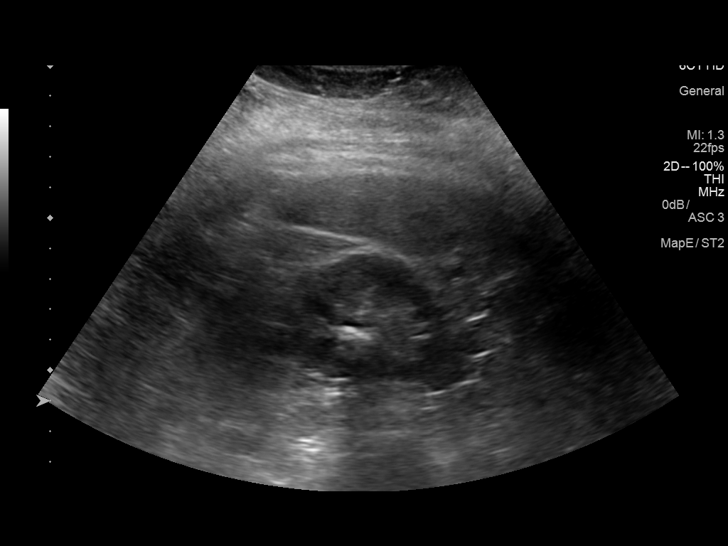
[im 35/65]
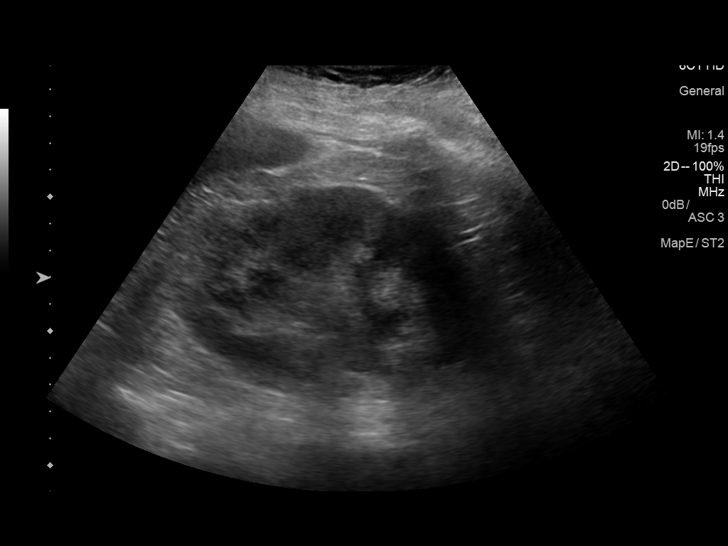
[im 41/65]
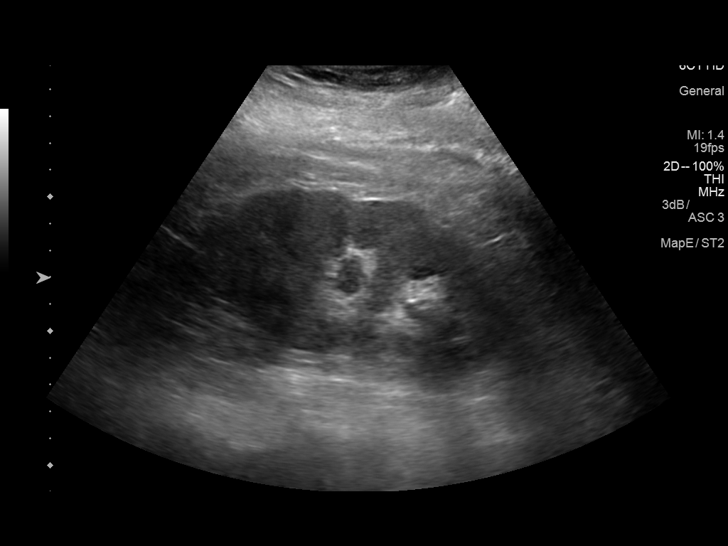
[im 43/65]
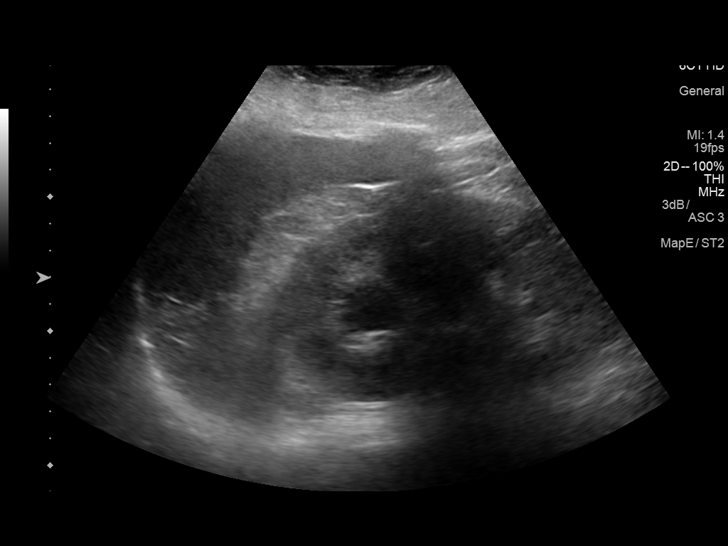
[im 49/65]
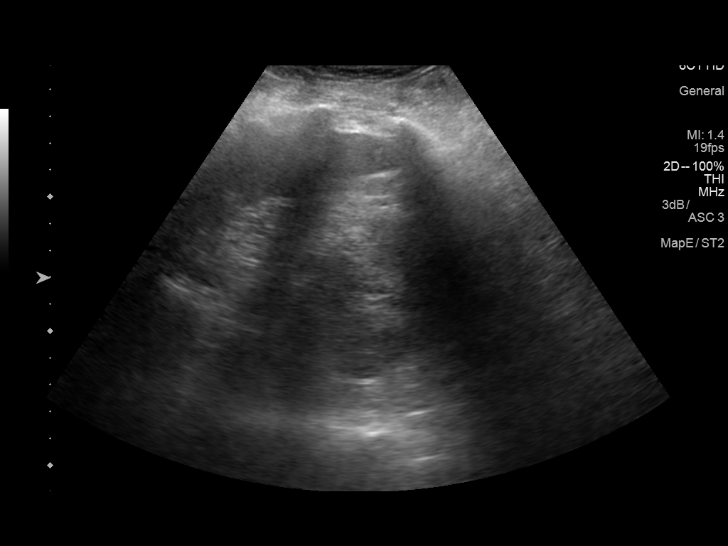
[im 54/65]
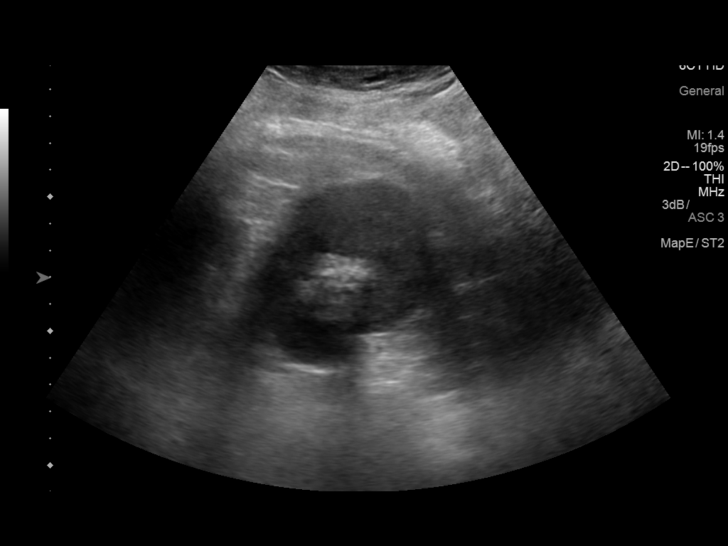
[im 59/65]
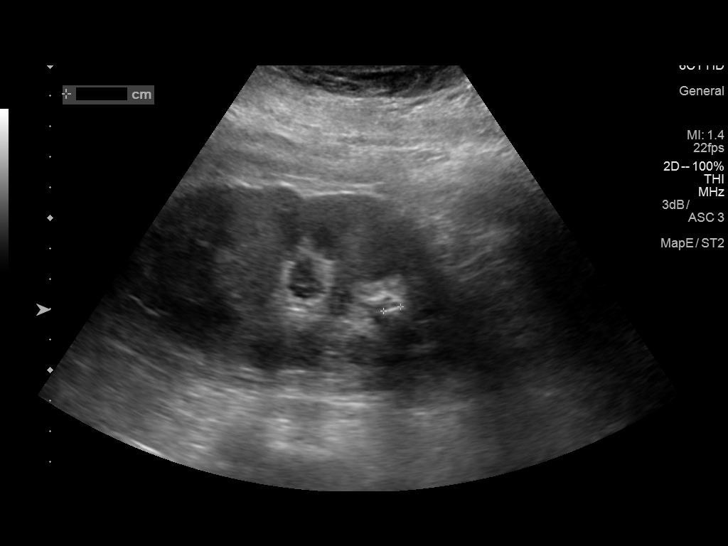
[im 65/65]
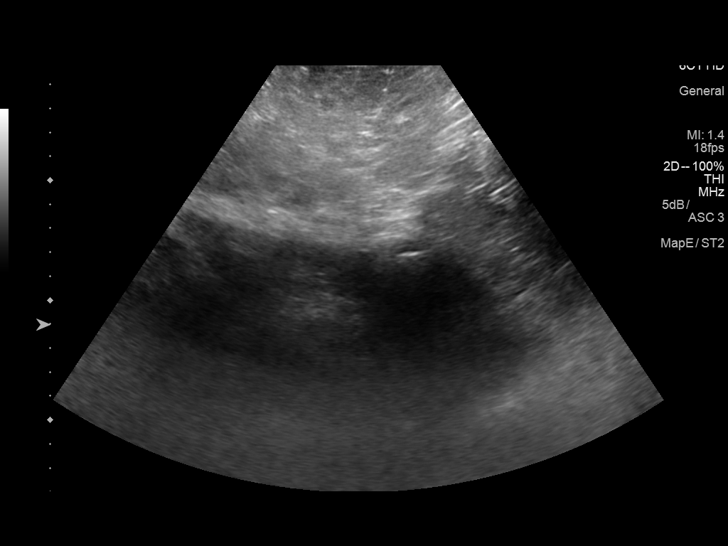

[14 of 25 positions shown; findings below may reference images not displayed]

FINDINGS: Right Kidney:

Length: 10 cm. Echogenicity within normal limits. No mass or
hydronephrosis visualized. 7 mm echogenic focus in the interpolar
region of the right kidney with some mild posterior acoustic
shadowing, compatible with a calculus.

Left Kidney:

Length: 14.9 cm. Echogenicity within normal limits. Mild to moderate
hydronephrosis. No mass visualized. Echogenic focus measuring 6 mm
in the lower pole with some posterior acoustic shadowing, compatible
with a calculus.

Bladder:

Under distended and poorly visualized.
IMPRESSION: 1. Small nonobstructive calculi in the collecting systems of both
kidneys.
2. Mild to moderate hydronephrosis.

## 2018-02-28 DIAGNOSIS — Z933 Colostomy status: Secondary | ICD-10-CM | POA: Diagnosis not present

## 2018-02-28 DIAGNOSIS — L89319 Pressure ulcer of right buttock, unspecified stage: Secondary | ICD-10-CM | POA: Diagnosis not present

## 2018-03-01 DIAGNOSIS — B9562 Methicillin resistant Staphylococcus aureus infection as the cause of diseases classified elsewhere: Secondary | ICD-10-CM | POA: Diagnosis not present

## 2018-03-01 DIAGNOSIS — T83592A Infection and inflammatory reaction due to indwelling ureteral stent, initial encounter: Secondary | ICD-10-CM | POA: Diagnosis not present

## 2018-03-01 DIAGNOSIS — T82594D Other mechanical complication of infusion catheter, subsequent encounter: Secondary | ICD-10-CM | POA: Diagnosis not present

## 2018-03-01 DIAGNOSIS — Z452 Encounter for adjustment and management of vascular access device: Secondary | ICD-10-CM | POA: Diagnosis not present

## 2018-03-06 ENCOUNTER — Telehealth: Payer: Self-pay | Admitting: *Deleted

## 2018-03-06 NOTE — Telephone Encounter (Signed)
Received Home Health Certification and Plan of Care; forwarded to provider/SLS 08/05

## 2018-03-07 ENCOUNTER — Telehealth: Payer: Self-pay | Admitting: *Deleted

## 2018-03-07 DIAGNOSIS — L669 Cicatricial alopecia, unspecified: Secondary | ICD-10-CM | POA: Diagnosis not present

## 2018-03-07 DIAGNOSIS — D229 Melanocytic nevi, unspecified: Secondary | ICD-10-CM | POA: Diagnosis not present

## 2018-03-07 NOTE — Telephone Encounter (Signed)
Received Approval for [facility] services via [facility]; forwarded to provider/SLS 08/06

## 2018-03-08 ENCOUNTER — Encounter: Payer: Self-pay | Admitting: Family

## 2018-03-08 ENCOUNTER — Ambulatory Visit (INDEPENDENT_AMBULATORY_CARE_PROVIDER_SITE_OTHER): Payer: BLUE CROSS/BLUE SHIELD | Admitting: Family

## 2018-03-08 VITALS — BP 151/82 | HR 81 | Temp 98.7°F | Ht 62.0 in | Wt 217.0 lb

## 2018-03-08 DIAGNOSIS — N390 Urinary tract infection, site not specified: Secondary | ICD-10-CM | POA: Insufficient documentation

## 2018-03-08 DIAGNOSIS — D631 Anemia in chronic kidney disease: Secondary | ICD-10-CM | POA: Diagnosis not present

## 2018-03-08 DIAGNOSIS — T82594D Other mechanical complication of infusion catheter, subsequent encounter: Secondary | ICD-10-CM | POA: Diagnosis not present

## 2018-03-08 DIAGNOSIS — E860 Dehydration: Secondary | ICD-10-CM | POA: Diagnosis not present

## 2018-03-08 NOTE — Patient Instructions (Addendum)
Nice to meet you.  I would not recommend any additional antibiotic therapy.   Continue to monitor of symptoms of urinary tract infection.  It appears you are colonized with Serratia.  You should hear back regarding your referral to medical nutrition therapy.  I am happy to see you back as needed.

## 2018-03-08 NOTE — Assessment & Plan Note (Addendum)
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 There is no current evidence of infection or need for treatment currently. She is indeed at high risk of infection. Current pains are likely from the nephrostomy tubes. Urine samples taken from the nephrostomy tube may also be contaminated with Serratia. Continue to monitor symptoms at this time and follow up as needed.

## 2018-03-08 NOTE — Assessment & Plan Note (Addendum)
Surgeons would like her to lose weight prior to her procedure. Continue to await medical weight management. I will send a referral to medical nutrition therapy to see if that can expedite her starting to lose weight.

## 2018-03-08 NOTE — Progress Notes (Signed)
Subjective:    Patient ID: [name], female    DOB: Apr 09, 1956, 62 y.o.   MRN: [id number]  Chief Complaint  Patient presents with  . New Patient (Initial Visit)    Pt stated having discomfort kiney area--since feb 2019    HPI:  [name] is a 62 y.o. female who presents today for initial evaluation and treatment of recurrent UTI.   Ms. Oshel has a significant previous medical history of endometrial/ovarian cancer in 2006 that recurred with a peri-rectal mass in 2016 and status post radiation and posterior mass resection. She did experience a bladder injury which was resected and repaired. Has required multiple stent placements and removals secondary to infection. In August 2018 she underwent a robotic right ureteral reimplant with psoas hitch at the same time as a Hartman's reversal and loop ileostomy. She had the complication of a urine leak and has required a catheter since surgery with bilateral percutaneous nephrostomy tubes. Recently evaluated by [facility] with evaluation showing she is not a candidate for repair and the recommendation of exenteration.  Since January of this year she has been experiencing multiple urinary tract infections and has received several antibiotics. Cultures have revealed Serratia and MRSA. Serratia appears in most cultures and has developed resistance over time. She has her nephrostomy tubes replaced every 6 weeks and has the cultures drawn from these bags. Her most recent urine culture was positive for Serratia with resistance to ciprofloxacin, nitrofurantoin, cefazolin with sensitivity to ceftriaxone, gentamycin and sulfamethoxazole-trimethoprim. Currently experiencing pain in her back around her flank that is described as throbbing and waxes and wanes. Occasionally feels pressure in her bladder. No fevers, chills, or sweats, suprapubic tenderness, nausea, or vomiting. [facility] has placed her on daily Macrobid for prevention pending the upcoming  surgery.   Mr. Bowditch also expresses concerns about the potential for upcoming surgery as the surgeons would like her to lose weight prior to her surgery. She has been referred to medical weight management, however there is an extensive waiting list of about 200 individuals before her. She has also been exploring other nutrition resources in the community.   Allergies  Allergen Reactions  . Penicillins Swelling    Facial swelling/childhood allergy Has patient had a PCN reaction causing immediate rash, facial/tongue/throat swelling, SOB or lightheadedness with hypotension: Yes Has patient had a PCN reaction causing severe rash involving mucus membranes or skin necrosis: Yes Has patient had a PCN reaction that required hospitalization yes Has patient had a PCN reaction occurring within the last 10 years: No If all of the above answers are "NO", then may proceed with Cephalosporin use.   . Cefaclor Rash    Ceclor  . Erythromycin Other (See Comments)    Gastritis, abd cramps  . Tape Rash    blisters  . Trimethoprim Rash  . Ultram [Tramadol] Hives  . Cephalosporins Rash  . Fluconazole Rash  . Oxycodone Other (See Comments)    " I just feel weird"  . Pectin Rash    Pectin ring for stoma  . Septra [Sulfamethoxazole-Trimethoprim] Rash  . Sulfa Antibiotics Rash      Outpatient Medications Prior to Visit  Medication Sig Dispense Refill  . anastrozole (ARIMIDEX) 1 MG tablet TAKE 1 TABLET DAILY 90 tablet 3  . Biotin 5 MG TABS Take 5 mg by mouth every morning.     . Cholecalciferol (VITAMIN D3) 10000 UNITS capsule Take 10,000 Units by mouth once a week. Sunday evening's    .  diphenoxylate-atropine (LOMOTIL) 2.5-0.025 MG tablet Take 1 tablet by mouth 4 (four) times daily as needed for diarrhea or loose stools. TO PREVENT LOOSE BOWEL MOVEMENTS (Patient taking differently: Take 1 tablet by mouth every other day. TO PREVENT LOOSE BOWEL MOVEMENTS) 30 tablet 0  . fentaNYL (DURAGESIC) 50 MCG/HR  Place 1 patch (50 mcg total) onto the skin every 3 (three) days. 10 patch 0  . ferrous sulfate 325 (65 FE) MG tablet Take 1 tablet (325 mg total) by mouth at bedtime. 30 tablet 3  . HYDROcodone-acetaminophen (NORCO/VICODIN) 5-325 MG tablet Take 1 tablet by mouth every 6 (six) hours as needed for moderate pain. 30 tablet 0  . insulin aspart (NOVOLOG) 100 UNIT/ML injection Inject 4 Units into the skin 3 (three) times daily with meals.    . insulin glargine (LANTUS) 100 UNIT/ML injection Inject 14 Units into the skin 2 (two) times daily.    [name] Kitchen JANUVIA 50 MG tablet     . levothyroxine (SYNTHROID, LEVOTHROID) 150 MCG tablet Take 1 tablet (150 mcg total) by mouth daily before breakfast. 30 tablet 1  . loratadine (CLARITIN) 10 MG tablet Take 10 mg by mouth every morning.     [name] Kitchen MAGNESIUM SULFATE IV Inject 2 g into the vein every Wednesday. Given in 1L 0.9% NS by [facility]    . nitrofurantoin, macrocrystal-monohydrate, (MACROBID) 100 MG capsule Take by mouth.    . omega-3 acid ethyl esters (LOVAZA) 1 G capsule Take 1 g by mouth 2 (two) times daily.     Vladimir Faster Glycol-Propyl Glycol (SYSTANE OP) Place 1 drop into both eyes daily as needed (dry eyes).     . pregabalin (LYRICA) 50 MG capsule Take 1 capsule (50 mg total) by mouth 2 (two) times daily. 180 capsule 1  . Prenatal Vit-Fe Fumarate-FA (PRENATAL VITAMIN PO) Take 1 capsule by mouth daily. Takes prenatal because there are no dyes in it    . protein supplement shake (PREMIER PROTEIN) LIQD Take 325 mLs (11 oz total) by mouth 2 (two) times daily as needed (Pt to request). 60 Can 1  . ranitidine (ZANTAC) 150 MG tablet Take 1 tablet (150 mg total) by mouth 2 (two) times daily as needed for heartburn. (Patient taking differently: Take 150 mg by mouth at bedtime. ) 60 tablet 1  . rosuvastatin (CRESTOR) 10 MG tablet Take 10 mg by mouth every evening.     . Saccharomyces boulardii (FLORASTOR PO) Take 1 capsule by mouth daily.    . sodium chloride 0.9 % 1,000 mL  Inject 500 mLs into the vein once a week. On Wednesday with magnesium    . sodium chloride 0.9 % injection Inject 10 mLs into the vein daily.    . Tbo-Filgrastim (GRANIX) 480 MCG/0.8ML SOSY injection Inject 0.8 mLs (480 mcg total) into the skin daily. 30 Syringe 11  . acetaminophen (TYLENOL) 325 MG tablet Take 2 tablets (650 mg total) by mouth every 6 (six) hours as needed. (Patient not taking: Reported on 03/08/2018) 30 tablet 1  . collagenase (SANTYL) ointment Apply 1 application topically daily as needed (skin care).     . diphenhydrAMINE (BENADRYL) 25 mg capsule Take 1 capsule (25 mg total) by mouth every 8 (eight) hours as needed for itching, allergies or sleep. (Patient not taking: Reported on 03/08/2018) 30 capsule 0  . heparin lock flush 100 UNIT/ML SOLN injection Inject 500 Units into the vein daily.    [name] Kitchen loperamide (IMODIUM) 2 MG capsule Take 1-2 capsules (2-4 mg total) by  mouth every 8 (eight) hours as needed for diarrhea or loose stools (Use if >2 BM every 8 hours). (Patient not taking: Reported on 03/08/2018) 30 capsule 0  . MONUROL 3 g PACK USE 1 PACKET AS DIRECTED Q 48 H FOR 3 DOES  0  . nitrofurantoin, macrocrystal-monohydrate, (MACROBID) 100 MG capsule Take 1 capsule by mouth daily.     No facility-administered medications prior to visit.      Past Medical History:  Diagnosis Date  . Adrenal adenoma, left 02/08/2016   CT: stable benign  . Anemia in neoplastic disease   . Benign essential HTN   . 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 Cancer of corpus uteri, except isthmus [facility])  oncologist-- dr Denman [name] and dr Alvy Bimler    10-15-2004  dx endometroid endometrial and ovarian cancer s/p  chemotheapy and surgery(TAH w/ BSO) :  recurrent 11-19-2014 post pelvic surgery and radiation 01-29-2015 to  03-10-2015  . Chronic idiopathic neutropenia (HCC)    presumed related to chemotherapy March 2006--- followed by dr Alvy Bimler (treatment w/ G-CSF injections  . Chronic nausea   . Chronic pain    perineal/ anal  area from bladder pad irritates skin , right flank pain  . CKD stage G2/A3, GFR 60-89 and albumin creatinine ratio >300 mg/g    nephrologist-  dr Madelon Lips  . Diabetic retinopathy, background ([place])   . Difficult intravenous access    small veins--- hx PICC lines  . DM type 2 (diabetes mellitus, type 2) ([place])    monitored by dr Legrand Como altheimer  . Dysuria   . Environmental and seasonal allergies   . Fatty liver 02/08/2016   CT  . Generalized muscle weakness   . GERD (gastroesophageal reflux disease)   . Hiatal hernia   . History of abdominal abscess 04/16/2017   post surgery 04-01-2017  --- resolved 10/ 2018  . History of gastric polyp    2014  duodenum  . History of ileus 04/16/2017   resolved w/ no surgical intervention  . History of radiation therapy    01-29-2015 to 03-10-2015  pelvis 50.4Gy  . Hypothyroidism    monitored by dr Legrand Como altheimer  . Ileostomy in place [facility] 04/01/2017   created at same time colostomy takedown.  . Lower urinary tract symptoms (LUTS)    urge urinary  incontinence  . Mixed dyslipidemia   . Multiple thyroid nodules    Managed by Dr. Harlow Asa  . Pelvic abscess in female 04/16/2017  . PONV (postoperative nausea and vomiting)    "scopolamine patch works for me"  . Radiation-induced dermatitis    contact dermatitis , radiation completed, rash only on ankles now.  . Seasonal allergies   . Ureteral stricture, right UROLOGIT-  DR [facility]   CHRONIC--  TREATMENT URETERAL STENT  . 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 Vitamin D deficiency   . Wears glasses       Past Surgical History:  Procedure Laterality Date  . APPENDECTOMY    . BREAST RECONSTRUCTION WITH PLACEMENT OF TISSUE EXPANDER AND FLEX HD (ACELLULAR HYDRATED  DERMIS) Bilateral 12/11/2015   Procedure: BILATERAL BREAST RECONSTRUCTION WITH PLACEMENT OF TISSUE EXPANDERS;  Surgeon: Irene Limbo, MD;  Location: [place];  Service: Plastics;  Laterality: Bilateral;  . COLONOSCOPY WITH PROPOFOL N/A 08/21/2013   Procedure: COLONOSCOPY WITH  PROPOFOL;  Surgeon: Cleotis Nipper, MD;  Location: [facility];  Service: Endoscopy;  Laterality: N/A;  . COLOSTOMY TAKEDOWN N/A 12/04/2014   Procedure: LAPROSCOPIC LYSIS OF ADHESIONS, SPLENIC MOBILIZATION, RELOCATION OF COLOSTOMY, DEBRIDEMENT INITIAL COLOSTOMY SITE;  Surgeon: Michael [name], MD;  [facility];  Service: General;  Laterality: N/A;  . CYSTOGRAM N/A 06/01/2017   Procedure: CYSTOGRAM;  Surgeon: Alexis Frock, MD;  [facility];  Service: Urology;  Laterality: N/A;  . CYSTOSCOPY W/ RETROGRADES Right 11/21/2015   Procedure: CYSTOSCOPY WITH RETROGRADE PYELOGRAM;  Surgeon: Alexis Frock, MD;  [facility];  Service: Urology;  Laterality: Right;  . CYSTOSCOPY W/ URETERAL STENT PLACEMENT Right 11/21/2015   Procedure: CYSTOSCOPY WITH STENT REPLACEMENT;  Surgeon: Alexis Frock, MD;  [facility];  Service: Urology;  Laterality: Right;  . CYSTOSCOPY W/ URETERAL STENT PLACEMENT Right 03/10/2016   Procedure: CYSTOSCOPY WITH STENT REPLACEMENT;  Surgeon: Alexis Frock, MD;  Location: [facility];  Service: Urology;  Laterality: Right;  . CYSTOSCOPY W/ URETERAL STENT PLACEMENT Right 06/30/2016   Procedure: CYSTOSCOPY WITH RETROGRADE PYELOGRAM/URETERAL STENT EXCHANGE;  Surgeon: Alexis Frock, MD;  Location: [facility];  Service: Urology;  Laterality: Right;  . CYSTOSCOPY W/ URETERAL STENT PLACEMENT N/A 06/01/2017   Procedure: CYSTOSCOPY WITH EXAM UNDER ANESTHESIA;  Surgeon: Alexis Frock, MD;  [facility];  Service: Urology;  Laterality: N/A;  . CYSTOSCOPY W/ URETERAL STENT PLACEMENT Right 08/17/2017   Procedure: CYSTOSCOPY WITH RETROGRADE PYELOGRAM/URETERAL STENT REMOVAL;   Surgeon: Alexis Frock, MD;  Location[facility];  Service: Urology;  Laterality: Right;  . CYSTOSCOPY WITH RETROGRADE PYELOGRAM, URETEROSCOPY AND STENT PLACEMENT Right 03/20/2015   Procedure: CYSTOSCOPY WITH RETROGRADE PYELOGRAM, URETEROSCOPY WITH BALLOON DILATION AND STENT PLACEMENT ON RIGHT;  Surgeon: Alexis Frock, MD;  Location: [facility];  Service: Urology;  Laterality: Right;  . CYSTOSCOPY WITH RETROGRADE PYELOGRAM, URETEROSCOPY AND STENT PLACEMENT Right 05/02/2015   Procedure: CYSTOSCOPY WITH RIGHT RETROGRADE PYELOGRAM,  DIAGNOSTIC URETEROSCOPY AND STENT PULL ;  Surgeon: Alexis Frock, MD;  Location: [facility];  Service: Urology;  Laterality: Right;  . CYSTOSCOPY WITH RETROGRADE PYELOGRAM, URETEROSCOPY AND STENT PLACEMENT Right 09/05/2015   Procedure: CYSTOSCOPY WITH RETROGRADE PYELOGRAM,  AND STENT PLACEMENT;  Surgeon: Alexis Frock, MD;  [facility];  Service: Urology;  Laterality: Right;  . CYSTOSCOPY WITH RETROGRADE PYELOGRAM, URETEROSCOPY AND STENT PLACEMENT Right 04/01/2017   Procedure: CYSTOSCOPY WITH RETROGRADE PYELOGRAM, URETEROSCOPY AND STENT PLACEMENT;  Surgeon: Alexis Frock, MD;  [facility];  Service: Urology;  Laterality: Right;  . CYSTOSCOPY WITH STENT PLACEMENT Right 10/27/2016   Procedure: CYSTOSCOPY WITH STENT CHANGE and right retrograde pyelogram;  Surgeon: Alexis Frock, MD;  Location: [facility];  Service: Urology;  Laterality: Right;  . EUS N/A 10/02/2014   Procedure: LOWER ENDOSCOPIC ULTRASOUND (EUS);  Surgeon: Arta Silence, MD;  Location: [facility];  Service: Endoscopy;  Laterality: N/A;  . EXCISION SOFT TISSUE MASS RIGHT FOREMAN  12-08-2006  . EYE SURGERY  as child   pytosis of eyelids repair  . INCISION AND DRAINAGE OF WOUND Bilateral 12/26/2015   Procedure: DEBRIDEMENT OF BILATERAL MASTECTOMY FLAPS;  Surgeon: Irene Limbo, MD;  Location: [place];  Service:  Plastics;  Laterality: Bilateral;  . IR CV LINE INJECTION  05/31/2017  . IR FLUORO GUIDE CV LINE LEFT  05/31/2017  . IR FLUORO GUIDE CV LINE RIGHT  04/06/2017  . IR FLUORO GUIDE CV MIDLINE PICC RIGHT  05/30/2017  .  IR NEPHROSTOGRAM LEFT INITIAL PLACEMENT  09/02/2017  . IR NEPHROSTOGRAM LEFT THRU EXISTING ACCESS  11/29/2017  . IR NEPHROSTOGRAM RIGHT INITIAL PLACEMENT  09/02/2017  . IR NEPHROSTOGRAM RIGHT THRU EXISTING ACCESS  09/13/2017  . IR NEPHROSTOGRAM RIGHT THRU EXISTING ACCESS  11/29/2017  . IR NEPHROSTOMY EXCHANGE LEFT  11/28/2017  . IR NEPHROSTOMY EXCHANGE LEFT  01/05/2018  . IR NEPHROSTOMY EXCHANGE LEFT  02/16/2018  . IR NEPHROSTOMY EXCHANGE RIGHT  10/02/2017  . IR NEPHROSTOMY EXCHANGE RIGHT  11/28/2017  . IR NEPHROSTOMY EXCHANGE RIGHT  01/05/2018  . IR NEPHROSTOMY EXCHANGE RIGHT  02/16/2018  . IR NEPHROSTOMY PLACEMENT LEFT  10/02/2017  . IR RADIOLOGIST EVAL & MGMT  05/03/2017  . IR US GUIDE VASC ACCESS LEFT  05/31/2017  . IR US GUIDE VASC ACCESS RIGHT  04/06/2017  . IR US GUIDE VASC ACCESS RIGHT  05/30/2017  . LAPAROSCOPIC CHOLECYSTECTOMY  1990  . LIPOSUCTION WITH LIPOFILLING Bilateral 04/16/2016   Procedure: LIPOSUCTION WITH LIPOFILLING TO BILATERAL CHEST;  Surgeon: Irene Limbo, MD;  Location: [place];  Service: Plastics;  Laterality: Bilateral;  . MASTECTOMY W/ SENTINEL NODE BIOPSY Bilateral 12/11/2015   Procedure: RIGHT PROPHYLACTIC MASTECTOMY, LEFT TOTAL MASTECTOMY WITH LEFT AXILLARY SENTINEL LYMPH NODE BIOPSY;  Surgeon: Stark Klein, MD;  Location: [place];  Service: General;  Laterality: Bilateral;  . OSTOMY N/A 11/19/2014   Procedure: OSTOMY;  Surgeon: Michael [name], MD;  [facility];  Service: General;  Laterality: N/A;  . PROCTOSCOPY N/A 04/01/2017   Procedure: RIDGE PROCTOSCOPY;  Surgeon: Michael [name], MD;  [facility];  Service: General;  Laterality: N/A;  . REMOVAL OF BILATERAL TISSUE EXPANDERS WITH PLACEMENT OF BILATERAL BREAST IMPLANTS Bilateral 04/16/2016    Procedure: REMOVAL OF BILATERAL TISSUE EXPANDERS WITH PLACEMENT OF BILATERAL BREAST IMPLANTS;  Surgeon: Irene Limbo, MD;  Location: [place];  Service: Plastics;  Laterality: Bilateral;  . ROBOTIC ASSISTED LAP VAGINAL HYSTERECTOMY N/A 11/19/2014   Procedure: ROBOTIC LYSIS OF ADHESIONS, CONVERTED TO LAPAROTOMY RADICAL UPPER VAGINECTOMY,LOW ANTERIOR BOWEL RESECTION, COLOSTOMY, BILATERAL URETERAL STENT PLACEMENT AND CYSTONOMY CLOSURE;  Surgeon: Everitt Amber, MD;  [facility];  Service: Gynecology;  Laterality: N/A;  . TISSUE EXPANDER FILLING Bilateral 12/26/2015   Procedure: EXPANSION OF BILATERAL CHEST TISSUE EXPANDERS (60 mL- Right; 75 mL- Left);  Surgeon: Irene Limbo, MD;  Location: [place];  Service: Plastics;  Laterality: Bilateral;  . TONSILLECTOMY    . TOTAL ABDOMINAL HYSTERECTOMY  March 2006   [facility]   and Bilateral Salpingoophorectomy/  staging for Ovarian cancer/  an  . XI ROBOTIC ASSISTED LOWER ANTERIOR RESECTION N/A 04/01/2017   Procedure: XI ROBOTIC VS LAPAROSCOPIC COLOSTOMY TAKEDOWN WITH LYSIS OF ADHESIONS.;  Surgeon: Michael [name], MD;  [facility];  Service: General;  Laterality: N/A;  ERAS PATHWAY      Family History  Problem Relation Age of Onset  . Cancer Mother 3       stomach ca  . Hypertension Mother   . Cancer Father 37       prostate ca  . Diabetes Father   . Heart disease Father        CABG  . Breast cancer Maternal Aunt        dx in her 72s  . Lymphoma Paternal Aunt   . Brain cancer Paternal Grandfather   . Ovarian cancer Other   . Diabetes Sister   . Hypertension Brother y-10  . Heart disease Brother  CABG  . Diabetes Brother       Social History   Socioeconomic History  . Marital status: Married    Spouse name: Not on file  . Number of children: 1  . Years of education: Not on file  . Highest education level: Not on file  Occupational History  . 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 Financial resource strain: Not on file  . Food insecurity:    Worry: Not on file    Inability: Not on file  . Transportation needs:    Medical: Not on file    Non-medical: Not on file  Tobacco Use  . Smoking status: Never Smoker  . Smokeless tobacco: Never Used  Substance and Sexual Activity  . Alcohol use: Not Currently  . Drug use: No  . Sexual activity: Not on file  Lifestyle  . Physical activity:    Days per week: Not on file    Minutes per session: Not on file  . Stress: Not on file  Relationships  . Social connections:    Talks on phone: Not on file    Gets together: Not on file    Attends religious service: Not on file    Active member of club or organization: Not on file    Attends meetings of clubs or organizations: Not on file    Relationship status: Not on file  . Intimate partner violence:    Fear of current or ex partner: Not on file    Emotionally abused: Not on file    Physically abused: Not on file    Forced sexual activity: Not on file  Other Topics Concern  . Not on file  Social History Narrative   Exercise-- has not gotten back into it since cancer came back      Review of Systems  Constitutional: Positive for fatigue. Negative for chills, fever and unexpected weight change.  Respiratory: Negative for chest tightness and shortness of breath.   Cardiovascular: Negative for chest pain and leg swelling.  Genitourinary: Positive for flank pain and hematuria (Occasional). Negative for decreased urine volume, dysuria, frequency, pelvic pain and urgency.       Objective:    BP (!) 151/82 (BP Location: Left Arm, Patient Position: Sitting, Cuff Size: Normal)   Pulse 81   Temp 98.7 F (37.1 C)   Ht 5' 2"  (1.575 m)   Wt 217 lb (98.4 kg)   BMI 39.69 kg/m  Nursing note and vital signs reviewed.  Physical Exam  Constitutional: She is oriented to person, place, and time. She appears well-developed and  well-nourished. She is cooperative. She has a sickly appearance. She appears ill. No distress.  Cardiovascular: Normal rate, regular rhythm, normal heart sounds and intact distal pulses. Exam reveals no gallop and no friction rub.  No murmur heard. Pulmonary/Chest: Effort normal and breath sounds normal. No stridor. No respiratory distress. She has no wheezes. She has no rales. She exhibits no tenderness.  Genitourinary:  Genitourinary Comments: Foley catheter and nephrostomy tubes are patient.   Neurological: She is alert and oriented to person, place, and time.  Skin: Skin is warm and dry.  Psychiatric: She has a normal mood and affect.        Assessment & Plan:   Problem List Items Addressed This Visit      Genitourinary   Recurrent UTI - Primary    Ms. 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 There is no current evidence of infection or need for treatment currently. She is indeed at high risk of infection. Current pains are likely from the nephrostomy tubes. Urine samples taken from the nephrostomy tube may also be contaminated with Serratia. Continue to monitor symptoms at this time and follow up as needed.         Other   Morbid obesity ([place])    Surgeons would like her to lose weight prior to her procedure. Continue to await medical weight management. I will send a referral to medical nutrition therapy to see if that can expedite her starting to lose weight.      Relevant Orders   Amb ref to Medical Nutrition Therapy-MNT       I have discontinued [name] B. Coey's MONUROL. I am also having her maintain her rosuvastatin, omega-3 acid ethyl esters, Vitamin D3, Polyethyl Glycol-Propyl Glycol (SYSTANE OP), Biotin, loratadine, Prenatal Vit-Fe Fumarate-FA (PRENATAL VITAMIN PO), acetaminophen, diphenhydrAMINE, diphenoxylate-atropine, ferrous sulfate, levothyroxine,  loperamide, protein supplement shake, ranitidine, collagenase, pregabalin, Tbo-Filgrastim, insulin glargine, (sodium chloride 0.9 % 1,000 mL), MAGNESIUM SULFATE IV, heparin lock flush, sodium chloride, nitrofurantoin (macrocrystal-monohydrate), JANUVIA, anastrozole, Saccharomyces boulardii (FLORASTOR PO), insulin aspart, fentaNYL, and HYDROcodone-acetaminophen.   No orders of the defined types were placed in this encounter.    Follow-up: As needed   Terri Piedra, MSN, FNP-C Nurse Practitioner [facility] [place] Office phone: [phone] Pager: [place] number: [phone]

## 2018-03-13 ENCOUNTER — Encounter: Payer: Self-pay | Admitting: General Practice

## 2018-03-13 ENCOUNTER — Telehealth: Payer: Self-pay | Admitting: *Deleted

## 2018-03-13 DIAGNOSIS — L89309 Pressure ulcer of unspecified buttock, unspecified stage: Secondary | ICD-10-CM | POA: Diagnosis not present

## 2018-03-13 NOTE — Telephone Encounter (Signed)
Patient authorization received for requested [place] via [facility]/SLS 08/12

## 2018-03-13 NOTE — Progress Notes (Signed)
[place] Spiritual Care Note  Received and returned phone call from [facility] after consulting with [facility] Neff/RD. Aron wondered if a [facility] RD could help her find a dietician who can advise how to pursue a 30-lb weight loss safely, given her medical complexity; per pt, this is a recommendation from [place] in preparation for surgery.   Per Raford Pitcher Neff/RD, pt's MD can put in a referral to [place]. Provided clinic name and contact info.  [place], [place], [facility] Pager [phone] Voicemail [phone]

## 2018-03-15 ENCOUNTER — Telehealth: Payer: Self-pay | Admitting: *Deleted

## 2018-03-15 DIAGNOSIS — E860 Dehydration: Secondary | ICD-10-CM | POA: Diagnosis not present

## 2018-03-15 DIAGNOSIS — Z932 Ileostomy status: Secondary | ICD-10-CM | POA: Diagnosis not present

## 2018-03-15 DIAGNOSIS — T82594D Other mechanical complication of infusion catheter, subsequent encounter: Secondary | ICD-10-CM | POA: Diagnosis not present

## 2018-03-15 NOTE — Telephone Encounter (Signed)
Received Physician Orders from [facility]; forwarded to provider/SLS 08/14

## 2018-03-16 ENCOUNTER — Encounter (INDEPENDENT_AMBULATORY_CARE_PROVIDER_SITE_OTHER): Payer: BLUE CROSS/BLUE SHIELD

## 2018-03-17 ENCOUNTER — Ambulatory Visit: Payer: BLUE CROSS/BLUE SHIELD | Admitting: Hematology and Oncology

## 2018-03-17 ENCOUNTER — Other Ambulatory Visit: Payer: BLUE CROSS/BLUE SHIELD

## 2018-03-21 DIAGNOSIS — L89309 Pressure ulcer of unspecified buttock, unspecified stage: Secondary | ICD-10-CM | POA: Diagnosis not present

## 2018-03-22 DIAGNOSIS — T82594D Other mechanical complication of infusion catheter, subsequent encounter: Secondary | ICD-10-CM | POA: Diagnosis not present

## 2018-03-22 DIAGNOSIS — E860 Dehydration: Secondary | ICD-10-CM | POA: Diagnosis not present

## 2018-03-22 DIAGNOSIS — Z933 Colostomy status: Secondary | ICD-10-CM | POA: Diagnosis not present

## 2018-03-22 DIAGNOSIS — L89319 Pressure ulcer of right buttock, unspecified stage: Secondary | ICD-10-CM | POA: Diagnosis not present

## 2018-03-22 DIAGNOSIS — N179 Acute kidney failure, unspecified: Secondary | ICD-10-CM | POA: Diagnosis not present

## 2018-03-23 ENCOUNTER — Other Ambulatory Visit: Payer: Self-pay | Admitting: Family Medicine

## 2018-03-23 DIAGNOSIS — C55 Malignant neoplasm of uterus, part unspecified: Secondary | ICD-10-CM

## 2018-03-23 IMAGING — DX DG CHEST 1V PORT
1 series · 1 of 1 positions shown · non-contrast
Comparison: 08/25/2014

CLINICAL DATA: Acute respiratory failure

EXAM:
PORTABLE CHEST 1 VIEW

[chest ap]
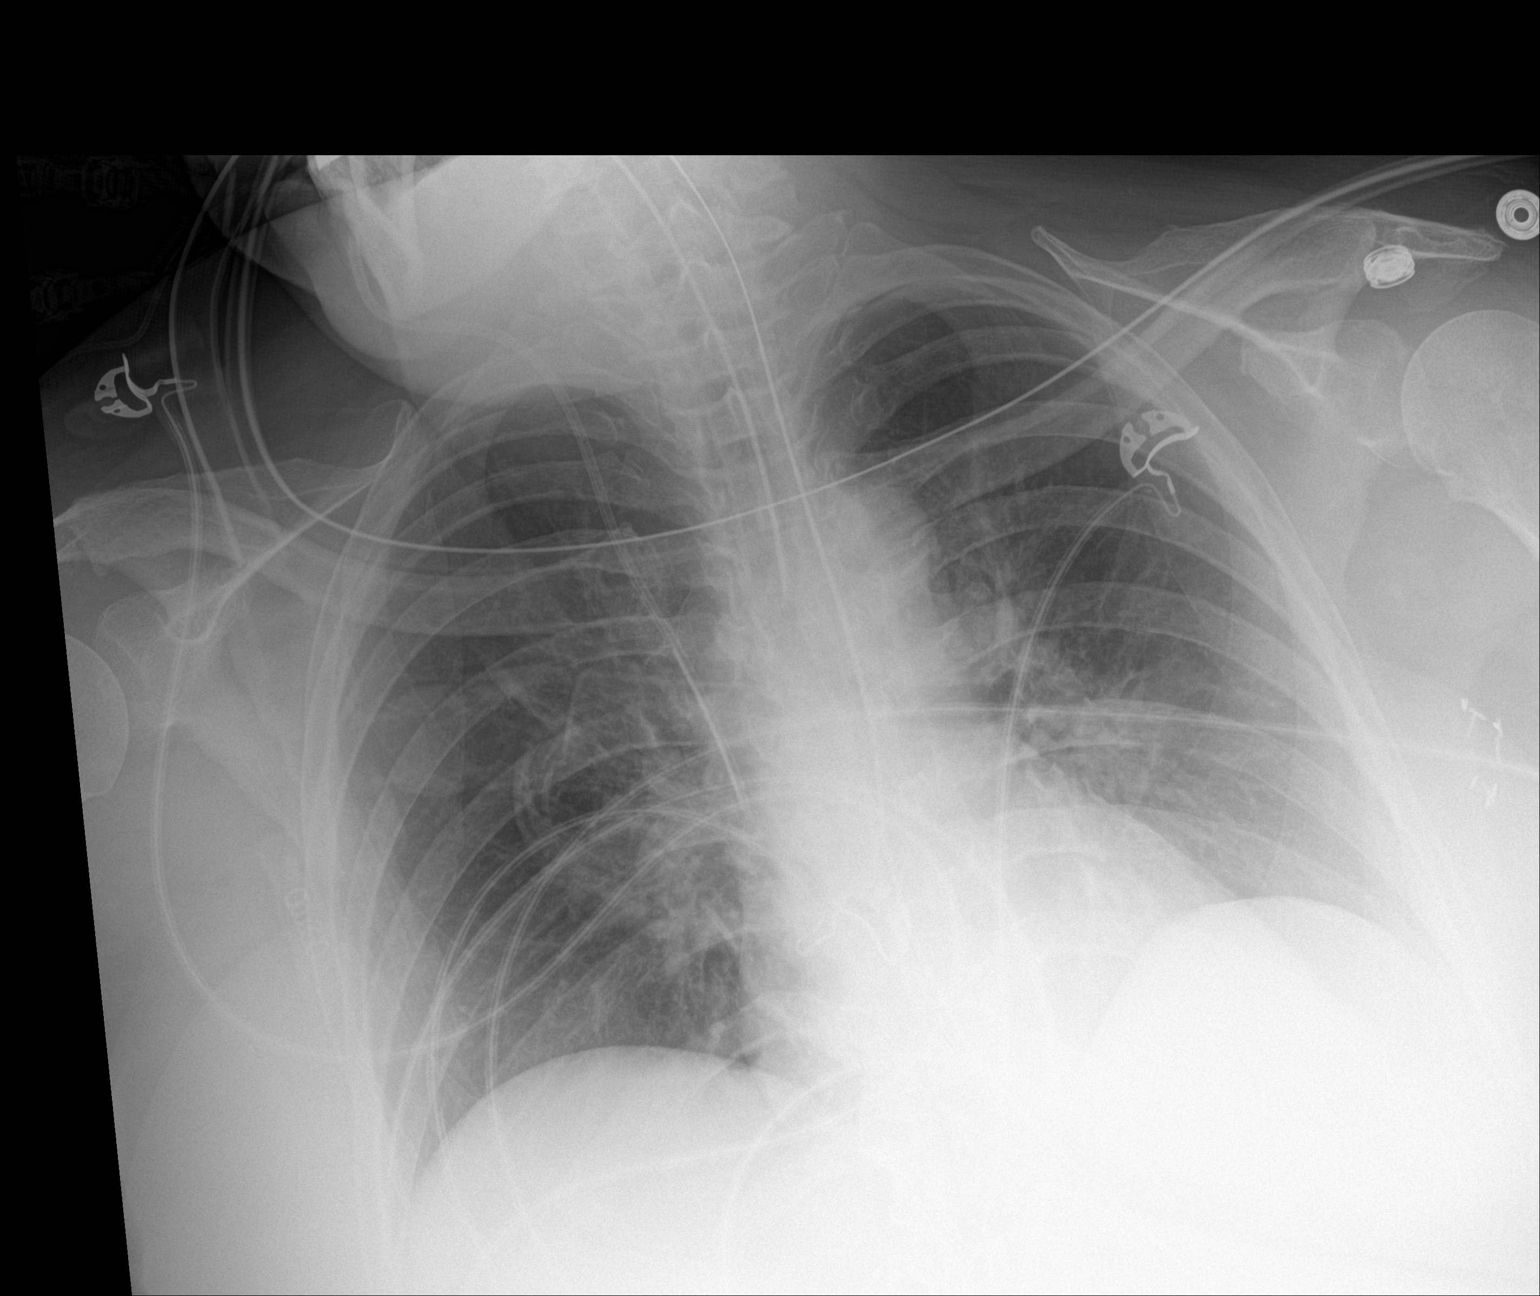

[1 of 1 positions shown; findings below may reference images not displayed]

FINDINGS: Endotracheal tube tip is about 14 mm superior to the carina.
Esophageal tube tip is below the diaphragm but not included on the
image. Right-sided central venous catheter tip overlies the distal
SVC. Hazy perihilar infiltrates. No effusion. No pneumothorax.
IMPRESSION: 1. Endotracheal tube tip about 1.4 cm superior to carina
2. Hazy perihilar opacity may reflect atelectasis or mild perihilar
infiltrates.

## 2018-03-24 IMAGING — DX DG ABDOMEN 1V
1 series · 1 of 1 positions shown · non-contrast
Comparison: Earlier films of same day

CLINICAL DATA: OG placement

EXAM:
ABDOMEN - 1 VIEW

[abdomen kub]
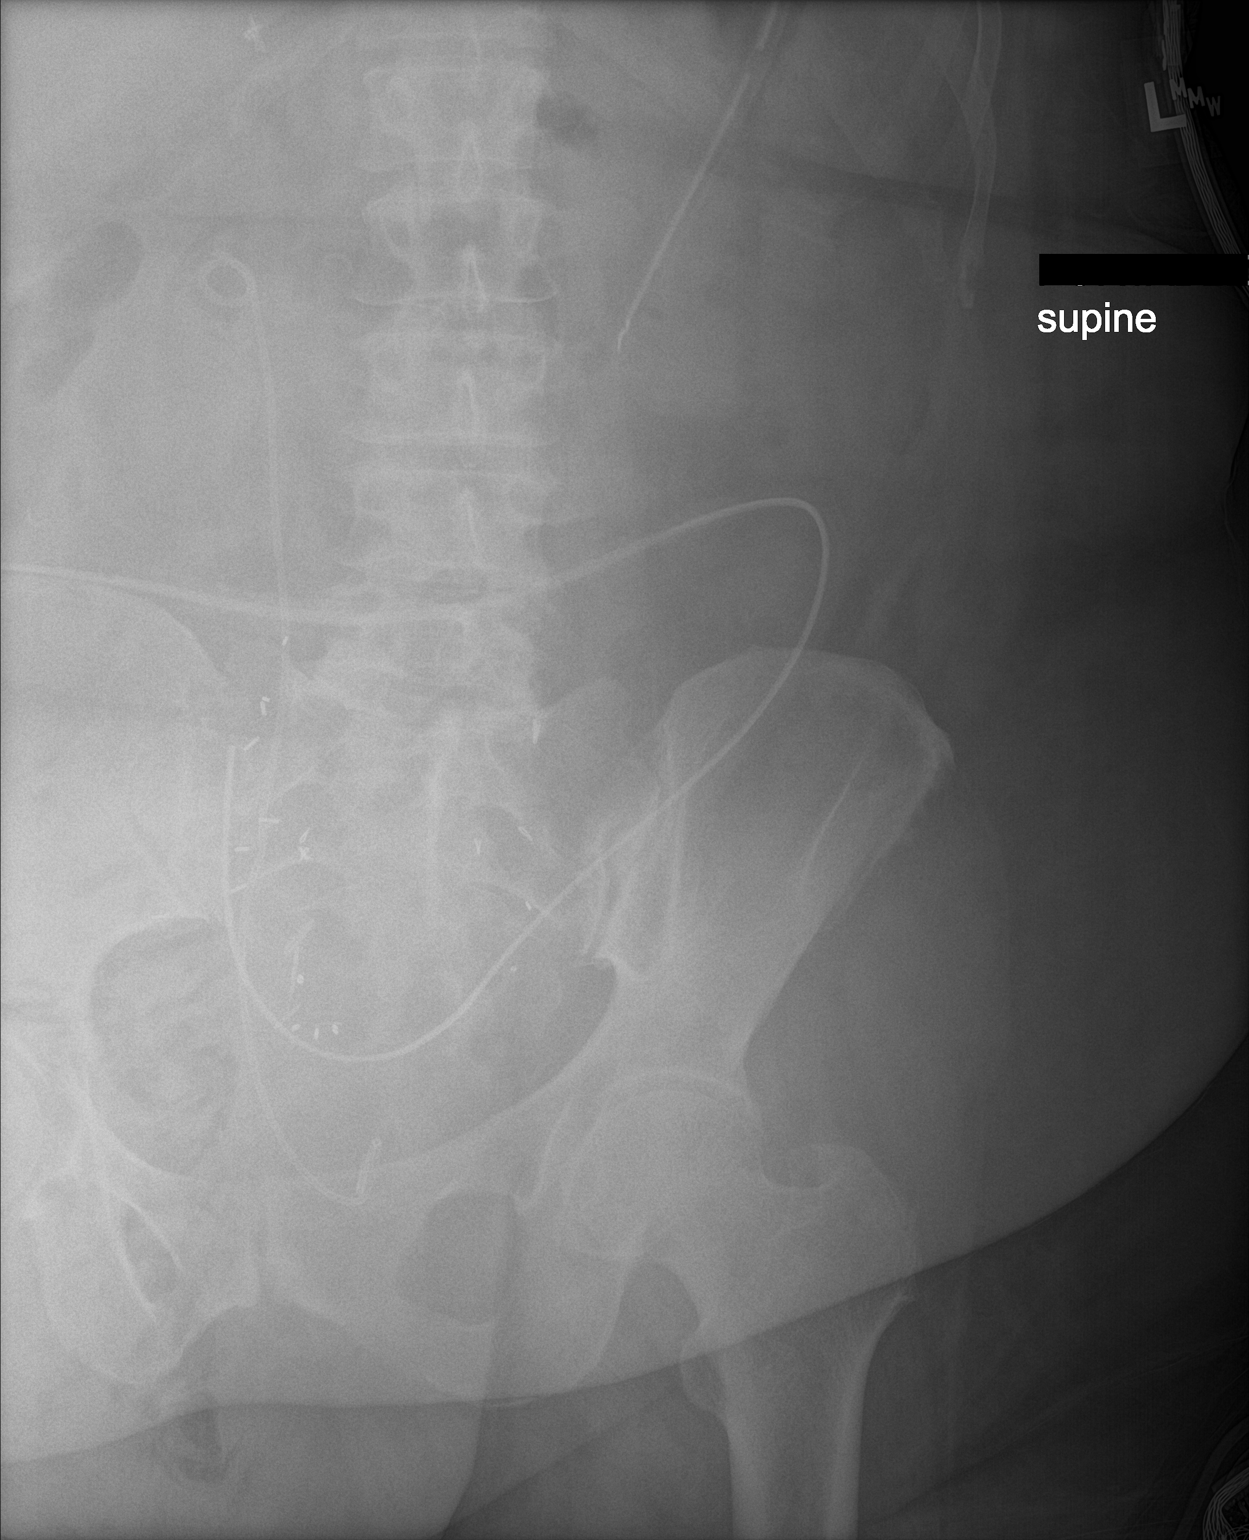

[1 of 1 positions shown; findings below may reference images not displayed]

FINDINGS: Orogastric tube extends to the gastric body, stomach decompressed.
Normal bowel gas pattern.

Stable right double-J ureteral stent. Drain catheter loops over the
lower abdomen. Cholecystectomy clips. Multiple surgical clips
project over the sacrum.

No abnormal abdominal calcifications.

Regional bones unremarkable.
IMPRESSION: 1. Stable position of orogastric tube, in the gastric body

## 2018-03-24 IMAGING — DX DG ABDOMEN 1V
1 series · 1 of 1 positions shown · non-contrast
Comparison: Earlier film of the same day

CLINICAL DATA: Orogastric tube placement

EXAM:
ABDOMEN - 1 VIEW

[abdomen kub]
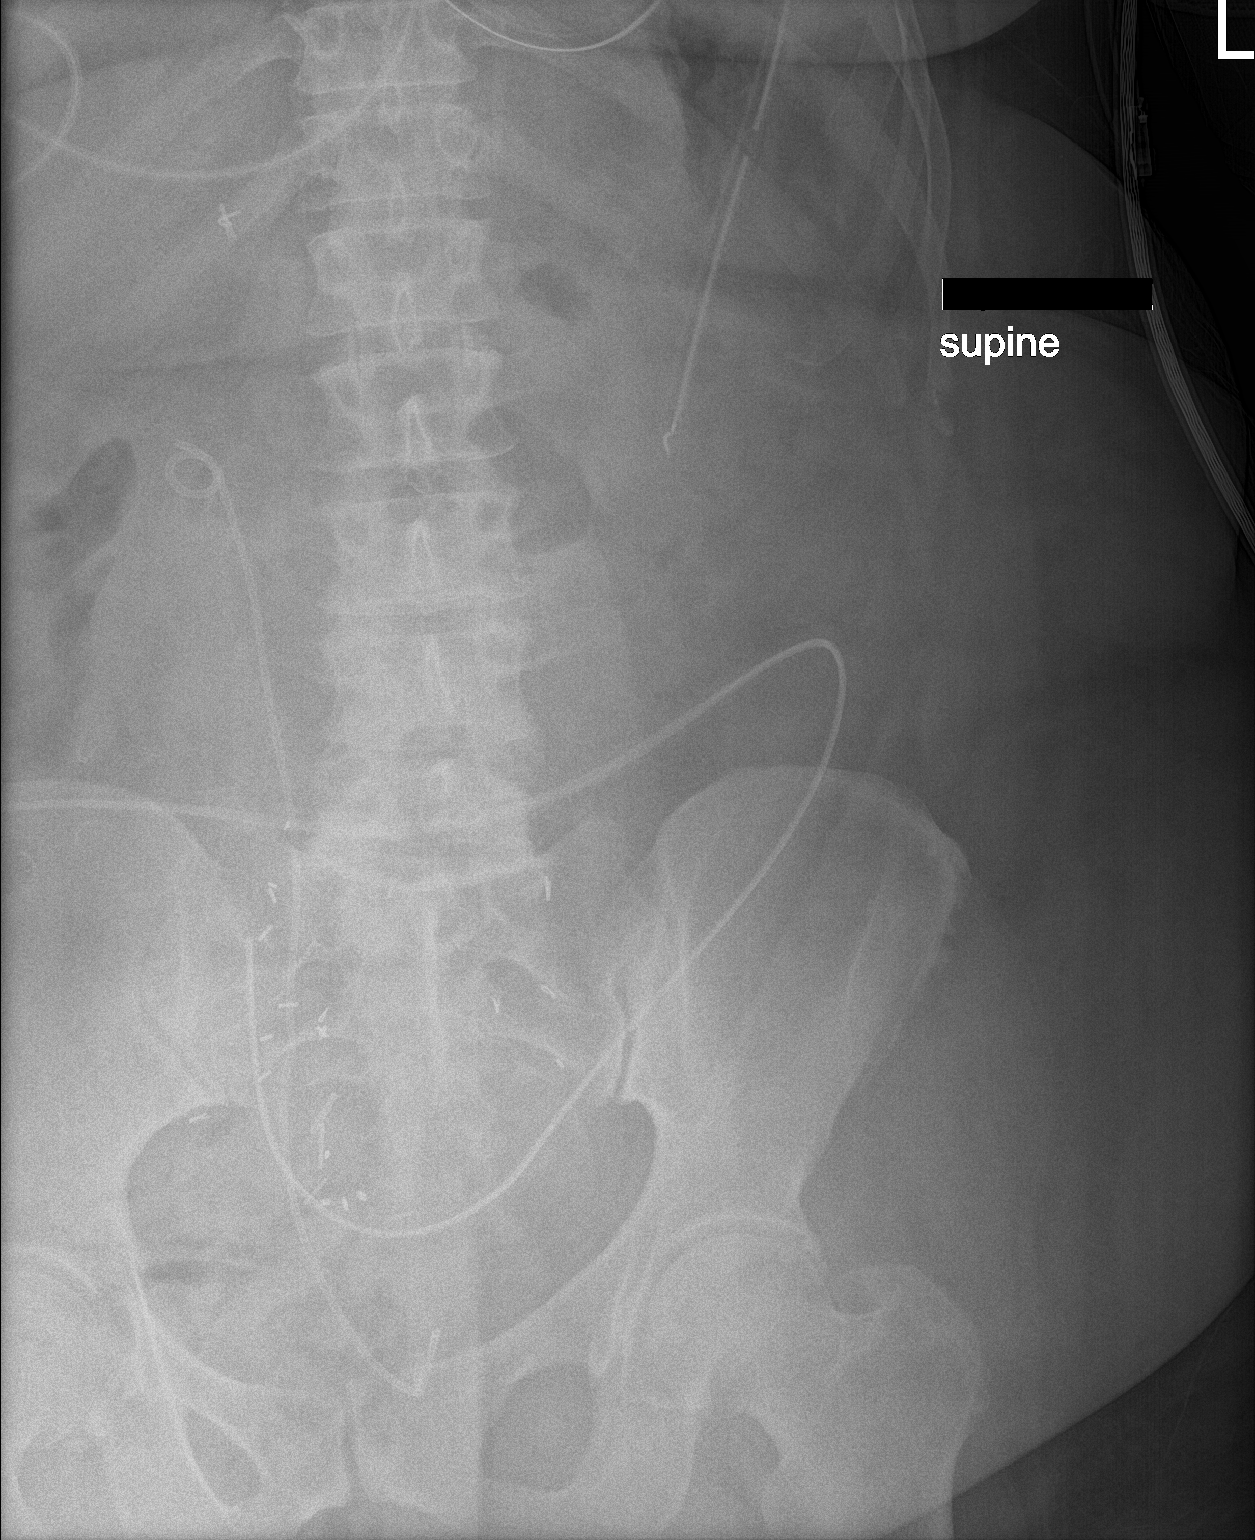

[1 of 1 positions shown; findings below may reference images not displayed]

FINDINGS: Orogastric tube extends to the body of the decompressed stomach.
Normal bowel gas pattern.

Stable right double-J ureteral stent. Drain catheter projects over
the lower abdomen. Cholecystectomy clips. Multiple surgical clips
project over the sacrum.

No abnormal abdominal calcifications.

Regional bones unremarkable.
IMPRESSION: 1. Orogastric tube to the body of the stomach.

## 2018-03-24 IMAGING — DX DG ABD PORTABLE 1V
1 series · 1 of 1 positions shown · non-contrast
Comparison: None available

CLINICAL DATA: OG tube placement

EXAM:
PORTABLE ABDOMEN - 1 VIEW

[abdomen kub]
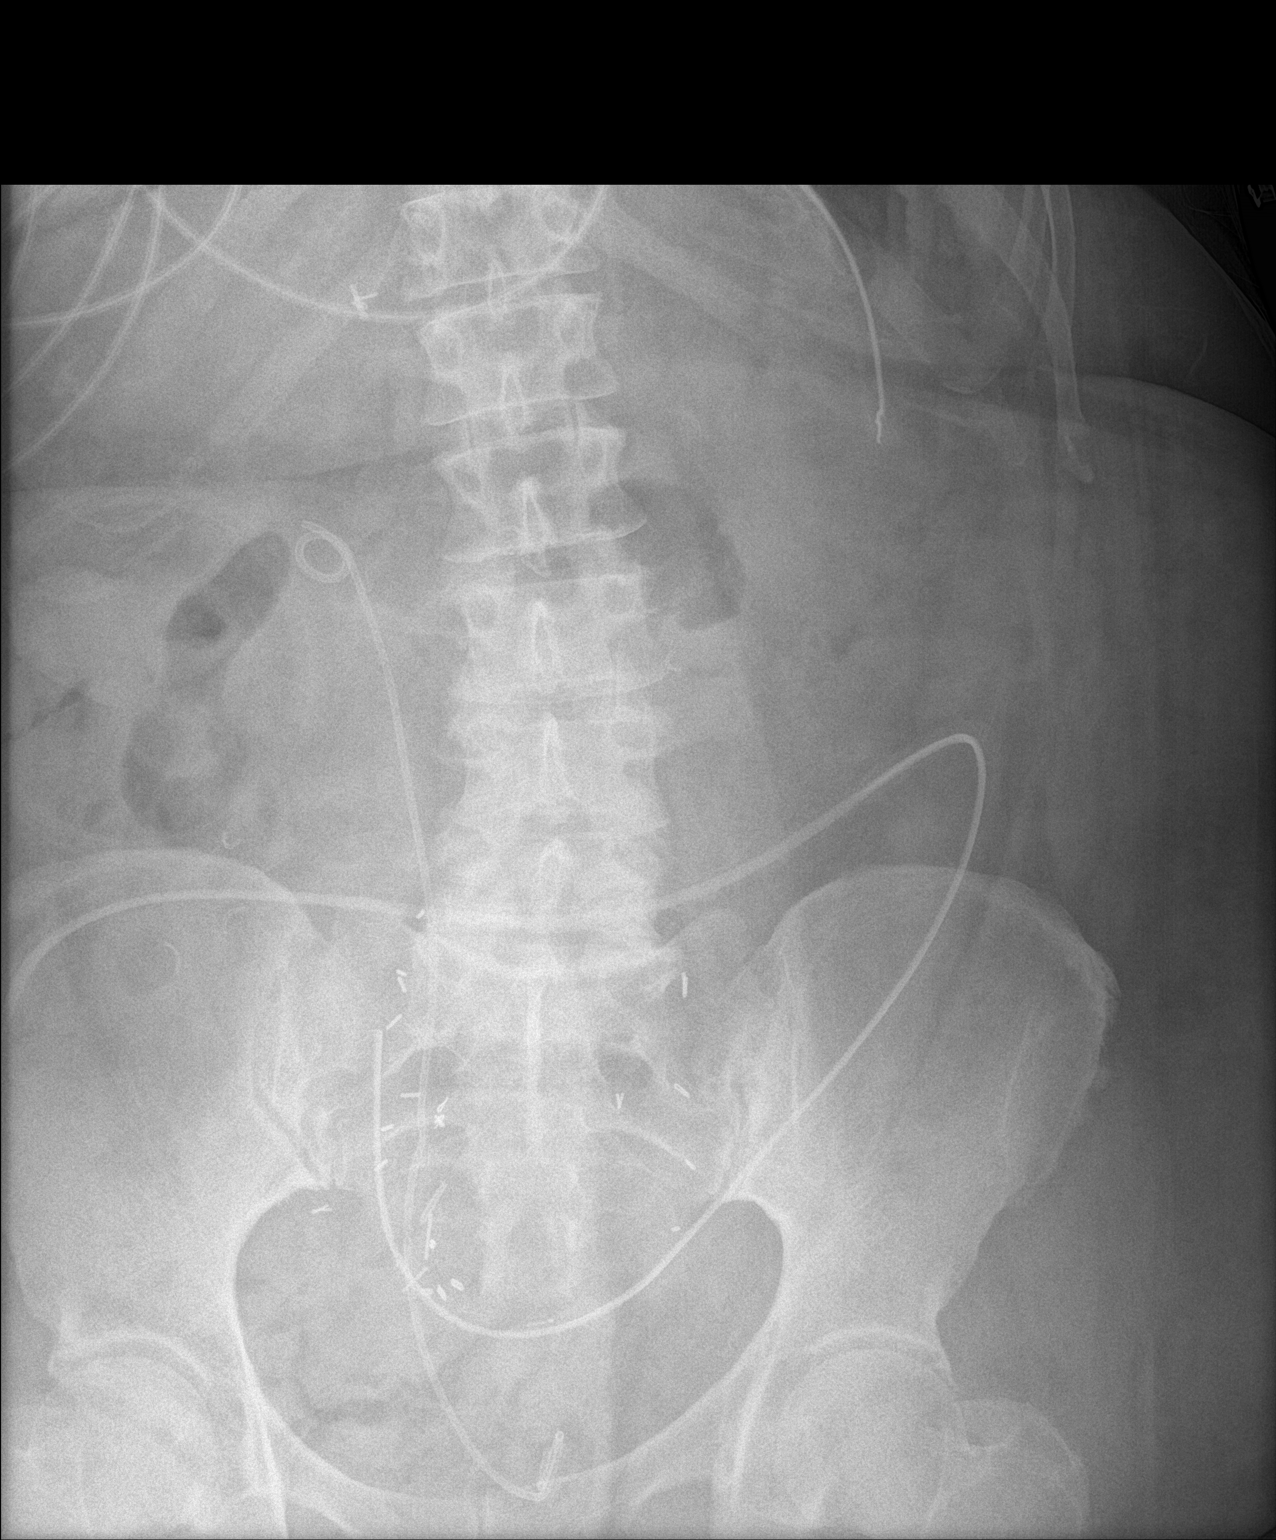

[1 of 1 positions shown; findings below may reference images not displayed]

FINDINGS: OG tube within the collapsed stomach body in the left abdomen. Right
ureteral stent in place. Jejunal feeding tube over the lower abdomen
and pelvis. Nonobstructive bowel gas pattern.
IMPRESSION: OG tube within the stomach.

## 2018-03-27 ENCOUNTER — Other Ambulatory Visit: Payer: Self-pay

## 2018-03-27 ENCOUNTER — Other Ambulatory Visit: Payer: Self-pay | Admitting: *Deleted

## 2018-03-27 ENCOUNTER — Telehealth: Payer: Self-pay | Admitting: Hematology and Oncology

## 2018-03-27 ENCOUNTER — Inpatient Hospital Stay: Payer: BLUE CROSS/BLUE SHIELD | Attending: Hematology and Oncology

## 2018-03-27 ENCOUNTER — Inpatient Hospital Stay: Payer: BLUE CROSS/BLUE SHIELD | Admitting: Hematology and Oncology

## 2018-03-27 DIAGNOSIS — C50212 Malignant neoplasm of upper-inner quadrant of left female breast: Secondary | ICD-10-CM | POA: Diagnosis not present

## 2018-03-27 DIAGNOSIS — Z170 Estrogen receptor positive status [ER+]: Secondary | ICD-10-CM

## 2018-03-27 DIAGNOSIS — N183 Chronic kidney disease, stage 3 unspecified: Secondary | ICD-10-CM

## 2018-03-27 DIAGNOSIS — D709 Neutropenia, unspecified: Secondary | ICD-10-CM

## 2018-03-27 DIAGNOSIS — D61818 Other pancytopenia: Secondary | ICD-10-CM | POA: Diagnosis not present

## 2018-03-27 DIAGNOSIS — Z8542 Personal history of malignant neoplasm of other parts of uterus: Secondary | ICD-10-CM | POA: Diagnosis not present

## 2018-03-27 DIAGNOSIS — Z8543 Personal history of malignant neoplasm of ovary: Secondary | ICD-10-CM | POA: Diagnosis not present

## 2018-03-27 DIAGNOSIS — C55 Malignant neoplasm of uterus, part unspecified: Secondary | ICD-10-CM

## 2018-03-27 LAB — CMP (CANCER CENTER ONLY)
ALT: 18 U/L (ref 0–44)
AST: 14 U/L — ABNORMAL LOW (ref 15–41)
Albumin: 3 g/dL — ABNORMAL LOW (ref 3.5–5.0)
Alkaline Phosphatase: 202 U/L — ABNORMAL HIGH (ref 38–126)
Anion gap: 7 (ref 5–15)
BILIRUBIN TOTAL: 0.2 mg/dL — AB (ref 0.3–1.2)
BUN: 34 mg/dL — AB (ref 8–23)
CO2: 26 mmol/L (ref 22–32)
Calcium: 9.7 mg/dL (ref 8.9–10.3)
Chloride: 104 mmol/L (ref 98–111)
Creatinine: 1.98 mg/dL — ABNORMAL HIGH (ref 0.44–1.00)
GFR, EST AFRICAN AMERICAN: 30 mL/min — AB (ref >60)
GFR, EST NON AFRICAN AMERICAN: 26 mL/min — AB (ref >60)
Glucose, Bld: 141 mg/dL — ABNORMAL HIGH (ref 70–99)
Potassium: 4 mmol/L (ref 3.5–5.1)
Sodium: 137 mmol/L (ref 135–145)
TOTAL PROTEIN: 8.3 g/dL — AB (ref 6.5–8.1)

## 2018-03-27 LAB — CBC WITH DIFFERENTIAL (CANCER CENTER ONLY)
BASOS ABS: 0.1 10*3/uL (ref 0.0–0.1)
Basophils Relative: 1 %
EOS ABS: 0.1 10*3/uL (ref 0.0–0.5)
Eosinophils Relative: 1 %
HCT: 31.6 % — ABNORMAL LOW (ref 34.8–46.6)
HEMOGLOBIN: 10.2 g/dL — AB (ref 11.6–15.9)
LYMPHS PCT: 10 %
Lymphs Abs: 0.5 10*3/uL — ABNORMAL LOW (ref 0.9–3.3)
MCH: 28.1 pg (ref 25.1–34.0)
MCHC: 32.2 g/dL (ref 31.5–36.0)
MCV: 87.2 fL (ref 79.5–101.0)
Monocytes Absolute: 0.9 10*3/uL (ref 0.1–0.9)
Monocytes Relative: 18 %
NEUTROS PCT: 70 %
Neutro Abs: 3.5 10*3/uL (ref 1.5–6.5)
Platelet Count: 268 10*3/uL (ref 145–400)
RBC: 3.62 MIL/uL — AB (ref 3.70–5.45)
RDW: 15.8 % — ABNORMAL HIGH (ref 11.2–14.5)
WBC: 5.1 10*3/uL (ref 3.9–10.3)

## 2018-03-27 MED ORDER — FENTANYL 50 MCG/HR TD PT72: 50.0000 ug | MEDICATED_PATCH | TRANSDERMAL | 0 refills | Status: DC

## 2018-03-27 NOTE — Telephone Encounter (Signed)
Request sent to provider for approval.  

## 2018-03-27 NOTE — Telephone Encounter (Signed)
Gave patient avs and calendar.   °

## 2018-03-27 NOTE — Telephone Encounter (Signed)
Received refill request for fentaNYL (DURAGESIC) 50 MCG/HR  Last OV: 02/17/18 Last RF: 02/21/18  02/17/18 UDS sample given, low risk Next UDS due 08/20/18.

## 2018-03-28 LAB — CA 125: Cancer Antigen (CA) 125: 8.6 U/mL (ref 0.0–38.1)

## 2018-03-28 NOTE — Progress Notes (Signed)
PA for Granix 435mcg/0.8mL has been approved.

## 2018-03-29 ENCOUNTER — Encounter: Payer: Self-pay | Admitting: Hematology and Oncology

## 2018-03-29 DIAGNOSIS — T82594D Other mechanical complication of infusion catheter, subsequent encounter: Secondary | ICD-10-CM | POA: Diagnosis not present

## 2018-03-29 DIAGNOSIS — N179 Acute kidney failure, unspecified: Secondary | ICD-10-CM | POA: Diagnosis not present

## 2018-03-29 DIAGNOSIS — C541 Malignant neoplasm of endometrium: Secondary | ICD-10-CM | POA: Diagnosis not present

## 2018-03-29 DIAGNOSIS — T83592A Infection and inflammatory reaction due to indwelling ureteral stent, initial encounter: Secondary | ICD-10-CM | POA: Diagnosis not present

## 2018-03-29 NOTE — Assessment & Plan Note (Signed)
Her CBC today is stable I recommend frequent injection to every 5-6 days.  For future surgery, due to her history of recurrent infection, I recommend we titrate the dosage of Neupogen to keep a consistently greater than 1.5 in the perioperative setting.

## 2018-03-29 NOTE — Assessment & Plan Note (Addendum)
She has multifactorial anemia, likely iron deficiency anemia due to chronic intermittent bleeding and anemia chronic illness due to chronic renal failure She is improving on oral iron supplement If she has difficulties tolerating oral iron in the future, I can prescribe intravenous iron infusion

## 2018-03-29 NOTE — Progress Notes (Signed)
[place] OFFICE PROGRESS NOTE  Patient Care Team: Carollee Herter, Alferd Apa, DO as PCP - General (Family Medicine) Heath Lark, MD as Consulting Physician (Hematology and Oncology) Everitt Amber, MD as Consulting Physician (Obstetrics and Gynecology) Michael [name], MD as Consulting Physician (General Surgery) Alexis Frock, MD as Consulting Physician (Urology) Altheimer, Legrand Como, MD as Consulting Physician (Endocrinology) Katy Apo, MD as Consulting Physician (Ophthalmology) Irene Limbo, MD as Consulting Physician (Plastic Surgery) Madelon Lips, MD as Consulting Physician (Nephrology)  ASSESSMENT & PLAN:  Breast cancer of upper-inner quadrant of left female breast [facility]) Her examination reveals some mild scarring and implant in situ She will continue Arimidex for minimum 5 years  Chronic neutropenia ([place]) Her CBC today is stable I recommend frequent injection to every 5-6 days.  For future surgery, due to her history of recurrent infection, I recommend we titrate the dosage of Neupogen to keep a consistently greater than 1.5 in the perioperative setting.  History of ovarian & endometrial cancer I have reviewed her CT scan The patient had significant postop complications with recurrent infection, renal failure and chronic pain with persistent stents and drainage placed. She has seen another surgeon at [facility] with plan for future surgery there I will defer to them for further management  CKD (chronic kidney disease), stage III ([place]) She will continue risk factor modification We discussed the importance of dietary modification to get her diabetes under control and adequate fluid intake to prevent dehydration  Pancytopenia, acquired ([place]) She has multifactorial anemia, likely iron deficiency anemia due to chronic intermittent bleeding and anemia chronic illness due to chronic renal failure She is improving on oral iron supplement If she has difficulties  tolerating oral iron in the future, I can prescribe intravenous iron infusion   No orders of the defined types were placed in this encounter.   INTERVAL HISTORY: Please see below for problem oriented charting. She returns for further follow-up on recurrent ovarian cancer, uterine cancer and breast cancer She is taking Arimidex for history of breast cancer.  She denies hot flashes. 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 She had multiple drains in situ with chronic pain Her pain is adequately treated with current prescription of fentanyl patch and hydrocodone She is also taking insulin for diabetes She is attempting to lose weight in anticipation for future surgery She denies any recent abnormal breast examination, palpable mass, abnormal breast appearance or nipple changes She has occasional bleeding from her drains but is taking oral iron supplement for iron deficiency anemia  SUMMARY OF ONCOLOGIC HISTORY: Oncology History   Breast cancer of upper-inner quadrant of left female breast ([place])   Staging form: Breast, AJCC 7th Edition     Clinical stage from 12/31/2015: Stage IIA (T2, N0, M0) - Signed by Heath Lark, MD on 12/31/2015  ER,PR positive Her2 neg     History of ovarian & endometrial cancer   10/15/2004 Initial Diagnosis    History of ovarian cancer, treated with chemotherapy carbo/Taxol    03/17/2009 Bone Marrow Biopsy    Bone marrow biopsy at [facility] showed neutropenia    01/23/2014 Bone Marrow Biopsy    Repeat bone marrow biopsy showed neutropenia    09/12/2014 Imaging    CT scan showed possible cancer  10/14/2014 Imaging    MRI show significant pelvic mass without invasion into the bladder but abutting to the rectum     11/19/2014 Surgery    she underwent surgery and had robotic-assisted lysis of adhesions, converted to laparotomy, radical upper vaginectomy and low anterior resection with colostomy. 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 Resection margins were negative    01/29/2015 - 03/10/2015 Radiation Therapy    She received adjuvant radiation    03/03/2015 Imaging    CT scan of the abdomen and pelvis showed status post interval removal of the bilateral nephroureteral stents. Worsening moderate right hydroureteronephrosis. Resolved left hydroureteronephrosis.    03/20/2015 Procedure    she had cystoscopy and stent placement for right hydronephrosis    05/02/2015 Surgery    Cystoscopy with right retrograde pyelogram interpretation. Diagnostic ureteroscopy. Removal of right ureteral stent.    05/14/2015 Imaging    CT scan of the abdomen and pelvis show unilateral right hydronephrosis with no residual cancer    08/18/2015 Imaging    CT scan showed hysterectomy with stable presacral soft tissue thickening. No definitive evidence of recurrent or metastatic disease. Severe right hydronephrosis    09/12/2015 Surgery    Cystoscopy with right retrograde pyelogram interpretation. Right ureteral stent placement 5 x 24 Polaris, no tether    10/07/2016 Imaging    Stable exam.  No new or progressive findings. 2. Stable appearance abnormal presacral soft tissue compatible with post treatment change. 3. Internal right ureteral stent with persistent right hydroureteronephrosis. Differentially decreased perfusion of the right kidney suggests a component of underlying obstructive uropathy. 4. Stable 13 mm left adrenal nodule, previously characterized as adenoma.      01/27/2017 Imaging    Stable postop and post radiation  changes in presacral region. No evidence of recurrent or metastatic carcinoma within the chest, abdomen, or pelvis. Stable mild to moderate right hydroureteronephrosis, with right ureteral stent in appropriate position. Stable small benign left adrenal adenoma and left thyroid lobe nodule.     Breast cancer of upper-inner quadrant of left female breast ([place])   10/14/2015 Imaging    Screening mammogram showed possible distortion in the left breast.    10/24/2015 Imaging    Targeted ultrasound is performed, showing a 0.6 x 0.8 x 0.9 cm area of hypoechoic distortion at the 10 o'clock position of the left breast 5 cm from the nipple    10/24/2015 Imaging    Diagnostic imaging confirmed 0.6 x 0.8 x 0.9 cm distortion in the upper inner left breast    10/30/2015 Procedure    Left US guided biopsy was performed    10/30/2015 Pathology Results    Accession: [id number] showed invasive ductal carcinoma, ER/PR positive, Her2 neg    11/05/2015 Genetic Testing    Genetic testing did not reveal a deleterious mutation.  Genetic testing did detect a Variant of Unknown Significance in the MSH6 gene called [id number]..  Genes tested include: APC, ATM, AXIN2, BARD1, BMPR1A, BRCA1, BRCA2, BRIP1, CDH1, CDKN2A, CHEK2, DICER1, EPCAM, GREM1, KIT, MEN1, MLH1, MSH2, MSH6, MUTYH, NBN, NF1, PALB2, PDGFRA, PMS2, POLD1, POLE, PTEN, RAD50, RAD51C, RAD51D, SDHA, SDHB, SDHC, SDHD, SMAD4, SMARCA4. STK11, TP53, TSC1, TSC2, and VHL.    12/11/2015 Surgery    She undwerwent bilateral mastectomy, left SLN biopsy and immediate reconstruction surgeries with bilateral plasma expanders    12/11/2015 Pathology  Results    Accession: [id number] mastectomy specimens showed left breast ca, [id number]    12/26/2015 Pathology Results    Accession: [id number] specimen from debridement showed no cancer    12/26/2015 Surgery    she underwent surgical debridement of necrotic tissue at site of plasma expander    01/12/2016 Imaging    Bone density is  normal    02/02/2016 -  Anti-estrogen oral therapy    She started taking Arimidex    10/21/2017 Procedure    Ultrasound guided biopsy of a vague hypoechoic shadowing lesion, generally corresponding to area palpable abnormality, along chest lateral to the reconstructed left breast and inferior to the left axilla. No apparent complications.    01/17/2018 Imaging    Bone density scan is normal     REVIEW OF SYSTEMS:   Constitutional: Denies fevers, chills or abnormal weight loss Eyes: Denies blurriness of vision Ears, nose, mouth, throat, and face: Denies mucositis or sore throat Respiratory: Denies cough, dyspnea or wheezes Cardiovascular: Denies palpitation, chest discomfort or lower extremity swelling Gastrointestinal:  Denies nausea, heartburn or change in bowel habits Skin: Denies abnormal skin rashes Lymphatics: Denies new lymphadenopathy or easy bruising Neurological:Denies numbness, tingling or new weaknesses Behavioral/Psych: Mood is stable, no new changes  All other systems were reviewed with the patient and are negative.  I have reviewed the past medical history, past surgical history, social history and family history with the patient and they are unchanged from previous note.  ALLERGIES:  is allergic to penicillins; cefaclor; erythromycin; tape; trimethoprim; ultram [tramadol]; cephalosporins; fluconazole; oxycodone; pectin; septra [sulfamethoxazole-trimethoprim]; and sulfa antibiotics.  MEDICATIONS:  Current Outpatient Medications  Medication Sig Dispense Refill  . acetaminophen (TYLENOL) 325 MG tablet Take 2 tablets (650 mg total) by mouth every 6 (six) hours as needed. (Patient not taking: Reported on 03/08/2018) 30 tablet 1  . anastrozole (ARIMIDEX) 1 MG tablet TAKE 1 TABLET DAILY 90 tablet 3  . Biotin 5 MG TABS Take 5 mg by mouth every morning.     . Cholecalciferol (VITAMIN D3) 10000 UNITS capsule Take 10,000 Units by mouth once a week. Sunday evening's    . collagenase  (SANTYL) ointment Apply 1 application topically daily as needed (skin care).     . diphenhydrAMINE (BENADRYL) 25 mg capsule Take 1 capsule (25 mg total) by mouth every 8 (eight) hours as needed for itching, allergies or sleep. (Patient not taking: Reported on 03/08/2018) 30 capsule 0  . diphenoxylate-atropine (LOMOTIL) 2.5-0.025 MG tablet Take 1 tablet by mouth 4 (four) times daily as needed for diarrhea or loose stools. TO PREVENT LOOSE BOWEL MOVEMENTS (Patient taking differently: Take 1 tablet by mouth every other day. TO PREVENT LOOSE BOWEL MOVEMENTS) 30 tablet 0  . fentaNYL (DURAGESIC) 50 MCG/HR Place 1 patch (50 mcg total) onto the skin every 3 (three) days. 10 patch 0  . ferrous sulfate 325 (65 FE) MG tablet Take 1 tablet (325 mg total) by mouth at bedtime. 30 tablet 3  . heparin lock flush 100 UNIT/ML SOLN injection Inject 500 Units into the vein daily.    [name] Kitchen HYDROcodone-acetaminophen (NORCO/VICODIN) 5-325 MG tablet Take 1 tablet by mouth every 6 (six) hours as needed for moderate pain. 30 tablet 0  . insulin aspart (NOVOLOG) 100 UNIT/ML injection Inject 4 Units into the skin 3 (three) times daily with meals.    . insulin glargine (LANTUS) 100 UNIT/ML injection Inject 14 Units into the skin 2 (two) times daily.    [name] Kitchen JANUVIA 50 MG tablet     .  levothyroxine (SYNTHROID, LEVOTHROID) 150 MCG tablet Take 1 tablet (150 mcg total) by mouth daily before breakfast. 30 tablet 1  . loperamide (IMODIUM) 2 MG capsule Take 1-2 capsules (2-4 mg total) by mouth every 8 (eight) hours as needed for diarrhea or loose stools (Use if >2 BM every 8 hours). (Patient not taking: Reported on 03/08/2018) 30 capsule 0  . loratadine (CLARITIN) 10 MG tablet Take 10 mg by mouth every morning.     [name] Kitchen MAGNESIUM SULFATE IV Inject 2 g into the vein every Wednesday. Given in 1L 0.9% NS by [facility]    . omega-3 acid ethyl esters (LOVAZA) 1 G capsule Take 1 g by mouth 2 (two) times daily.     Vladimir Faster Glycol-Propyl Glycol (SYSTANE OP)  Place 1 drop into both eyes daily as needed (dry eyes).     . pregabalin (LYRICA) 50 MG capsule Take 1 capsule (50 mg total) by mouth 2 (two) times daily. 180 capsule 1  . Prenatal Vit-Fe Fumarate-FA (PRENATAL VITAMIN PO) Take 1 capsule by mouth daily. Takes prenatal because there are no dyes in it    . protein supplement shake (PREMIER PROTEIN) LIQD Take 325 mLs (11 oz total) by mouth 2 (two) times daily as needed (Pt to request). 60 Can 1  . ranitidine (ZANTAC) 150 MG tablet Take 1 tablet (150 mg total) by mouth 2 (two) times daily as needed for heartburn. (Patient taking differently: Take 150 mg by mouth at bedtime. ) 60 tablet 1  . rosuvastatin (CRESTOR) 10 MG tablet Take 10 mg by mouth every evening.     . Saccharomyces boulardii (FLORASTOR PO) Take 1 capsule by mouth daily.    . sodium chloride 0.9 % 1,000 mL Inject 500 mLs into the vein once a week. On Wednesday with magnesium    . sodium chloride 0.9 % injection Inject 10 mLs into the vein daily.    . Tbo-Filgrastim (GRANIX) 480 MCG/0.8ML SOSY injection Inject 0.8 mLs (480 mcg total) into the skin daily. 30 Syringe 11   No current facility-administered medications for this visit.     PHYSICAL EXAMINATION: ECOG PERFORMANCE STATUS: 1 - Symptomatic but completely ambulatory  Vitals:   03/27/18 1357  BP: 135/70  Pulse: 78  Resp: 18  Temp: 98.2 F (36.8 C)  SpO2: 100%   Filed Weights   03/27/18 1357  Weight: 215 lb (97.5 kg)    GENERAL:alert, no distress and comfortable SKIN: skin color, texture, turgor are normal, no rashes or significant lesions EYES: normal, Conjunctiva are pink and non-injected, sclera clear OROPHARYNX:no exudate, no erythema and lips, buccal mucosa, and tongue normal  NECK: supple, thyroid normal size, non-tender, without nodularity LYMPH:  no palpable lymphadenopathy in the cervical, axillary or inguinal LUNGS: clear to auscultation and percussion with normal breathing effort HEART: regular rate &  rhythm and no murmurs and no lower extremity edema ABDOMEN:abdomen soft Musculoskeletal:no cyanosis of digits and no clubbing  NEURO: alert & oriented x 3 with fluent speech, no focal motor/sensory deficits Bilateral chest wall examination revealed well-healed mastectomy scar with implants in situ  LABORATORY DATA:  I have reviewed the data as listed    Component Value Date/Time   NA 137 03/27/2018 1324   NA 141 01/24/2017 1228   K 4.0 03/27/2018 1324   K 4.6 01/24/2017 1228   CL 104 03/27/2018 1324   CO2 26 03/27/2018 1324   CO2 29 01/24/2017 1228   GLUCOSE 141 (H) 03/27/2018 1324   GLUCOSE 84 01/24/2017  1228   BUN 34 (H) 03/27/2018 1324   BUN 22.2 01/24/2017 1228   CREATININE 1.98 (H) 03/27/2018 1324   CREATININE 0.8 01/24/2017 1228   CALCIUM 9.7 03/27/2018 1324   CALCIUM 10.2 01/24/2017 1228   PROT 8.3 (H) 03/27/2018 1324   PROT 6.9 01/24/2017 1228   ALBUMIN 3.0 (L) 03/27/2018 1324   ALBUMIN 3.4 (L) 01/24/2017 1228   AST 14 (L) 03/27/2018 1324   AST 16 01/24/2017 1228   ALT 18 03/27/2018 1324   ALT 14 01/24/2017 1228   ALKPHOS 202 (H) 03/27/2018 1324   ALKPHOS 90 01/24/2017 1228   BILITOT 0.2 (L) 03/27/2018 1324   BILITOT 0.34 01/24/2017 1228   GFRNONAA 26 (L) 03/27/2018 1324   GFRNONAA 78 12/16/2014 1530   GFRAA 30 (L) 03/27/2018 1324   GFRAA >89 12/16/2014 1530    No results found for: SPEP, UPEP  Lab Results  Component Value Date   WBC 5.1 03/27/2018   NEUTROABS 3.5 03/27/2018   HGB 10.2 (L) 03/27/2018   HCT 31.6 (L) 03/27/2018   MCV 87.2 03/27/2018   PLT 268 03/27/2018      Chemistry      Component Value Date/Time   NA 137 03/27/2018 1324   NA 141 01/24/2017 1228   K 4.0 03/27/2018 1324   K 4.6 01/24/2017 1228   CL 104 03/27/2018 1324   CO2 26 03/27/2018 1324   CO2 29 01/24/2017 1228   BUN 34 (H) 03/27/2018 1324   BUN 22.2 01/24/2017 1228   CREATININE 1.98 (H) 03/27/2018 1324   CREATININE 0.8 01/24/2017 1228      Component Value Date/Time    CALCIUM 9.7 03/27/2018 1324   CALCIUM 10.2 01/24/2017 1228   ALKPHOS 202 (H) 03/27/2018 1324   ALKPHOS 90 01/24/2017 1228   AST 14 (L) 03/27/2018 1324   AST 16 01/24/2017 1228   ALT 18 03/27/2018 1324   ALT 14 01/24/2017 1228   BILITOT 0.2 (L) 03/27/2018 1324   BILITOT 0.34 01/24/2017 1228       RADIOGRAPHIC STUDIES: I have reviewed her last CT imaging of the abdomen I have personally reviewed the radiological images as listed and agreed with the findings in the report.   All questions were answered. The patient knows to call the clinic with any problems, questions or concerns. No barriers to learning was detected.  I spent 20 minutes counseling the patient face to face. The total time spent in the appointment was 30 minutes and more than 50% was on counseling and review of test results  Heath Lark, MD 03/29/2018 9:57 AM

## 2018-03-29 NOTE — Assessment & Plan Note (Signed)
Her examination reveals some mild scarring and implant in situ She will continue Arimidex for minimum 5 years

## 2018-03-29 NOTE — Assessment & Plan Note (Signed)
She will continue risk factor modification We discussed the importance of dietary modification to get her diabetes under control and adequate fluid intake to prevent dehydration

## 2018-03-29 NOTE — Assessment & Plan Note (Signed)
I have reviewed her CT scan The patient had significant postop complications with recurrent infection, renal failure and chronic pain with persistent stents and drainage placed. She has seen another surgeon at [facility] with plan for future surgery there I will defer to them for further management

## 2018-03-30 ENCOUNTER — Other Ambulatory Visit: Payer: Self-pay | Admitting: Urology

## 2018-03-30 ENCOUNTER — Encounter (HOSPITAL_COMMUNITY): Payer: Self-pay | Admitting: Interventional Radiology

## 2018-03-30 ENCOUNTER — Other Ambulatory Visit (HOSPITAL_COMMUNITY): Payer: Self-pay | Admitting: Interventional Radiology

## 2018-03-30 ENCOUNTER — Ambulatory Visit (HOSPITAL_COMMUNITY)
Admission: RE | Admit: 2018-03-30 | Discharge: 2018-03-30 | Disposition: A | Discharge status: Home / Self Care | Payer: BLUE CROSS/BLUE SHIELD | Source: Ambulatory Visit | Attending: Urology | Admitting: Urology

## 2018-03-30 DIAGNOSIS — Z436 Encounter for attention to other artificial openings of urinary tract: Secondary | ICD-10-CM | POA: Insufficient documentation

## 2018-03-30 DIAGNOSIS — N135 Crossing vessel and stricture of ureter without hydronephrosis: Secondary | ICD-10-CM

## 2018-03-30 DIAGNOSIS — S3720XD Unspecified injury of bladder, subsequent encounter: Secondary | ICD-10-CM

## 2018-03-30 HISTORY — PX: IR NEPHROSTOMY EXCHANGE RIGHT: IMG6070

## 2018-03-30 HISTORY — PX: IR NEPHROSTOMY EXCHANGE LEFT: IMG6069

## 2018-03-30 MED ORDER — IOPAMIDOL (ISOVUE-300) INJECTION 61%
INTRAVENOUS | Status: AC
  Administered 2018-03-30: 20 mL
  Filled 2018-03-30: qty 50

## 2018-03-30 MED ORDER — LIDOCAINE HCL 1 % IJ SOLN
INTRAMUSCULAR | Status: AC
  Filled 2018-03-30: qty 20

## 2018-03-30 MED ORDER — IOPAMIDOL (ISOVUE-300) INJECTION 61%
50.0000 mL | Freq: Once | INTRAVENOUS | Status: AC | PRN
  Administered 2018-03-30: 20 mL

## 2018-03-30 NOTE — Procedures (Signed)
B PCN exchange EBL 0 Comp 0

## 2018-04-05 ENCOUNTER — Encounter (INDEPENDENT_AMBULATORY_CARE_PROVIDER_SITE_OTHER): Payer: Self-pay | Admitting: Family Medicine

## 2018-04-05 ENCOUNTER — Ambulatory Visit (INDEPENDENT_AMBULATORY_CARE_PROVIDER_SITE_OTHER): Payer: BLUE CROSS/BLUE SHIELD | Admitting: Family Medicine

## 2018-04-05 VITALS — BP 119/66 | HR 71 | Temp 97.6°F | Ht 62.0 in | Wt 208.0 lb

## 2018-04-05 DIAGNOSIS — T83592A Infection and inflammatory reaction due to indwelling ureteral stent, initial encounter: Secondary | ICD-10-CM | POA: Diagnosis not present

## 2018-04-05 DIAGNOSIS — F3289 Other specified depressive episodes: Secondary | ICD-10-CM | POA: Diagnosis not present

## 2018-04-05 DIAGNOSIS — Z794 Long term (current) use of insulin: Secondary | ICD-10-CM

## 2018-04-05 DIAGNOSIS — R5383 Other fatigue: Secondary | ICD-10-CM

## 2018-04-05 DIAGNOSIS — E1121 Type 2 diabetes mellitus with diabetic nephropathy: Secondary | ICD-10-CM

## 2018-04-05 DIAGNOSIS — Z9189 Other specified personal risk factors, not elsewhere classified: Secondary | ICD-10-CM

## 2018-04-05 DIAGNOSIS — Z933 Colostomy status: Secondary | ICD-10-CM | POA: Diagnosis not present

## 2018-04-05 DIAGNOSIS — Z6838 Body mass index (BMI) 38.0-38.9, adult: Secondary | ICD-10-CM

## 2018-04-05 DIAGNOSIS — R0602 Shortness of breath: Secondary | ICD-10-CM

## 2018-04-05 DIAGNOSIS — E860 Dehydration: Secondary | ICD-10-CM | POA: Diagnosis not present

## 2018-04-05 DIAGNOSIS — E639 Nutritional deficiency, unspecified: Secondary | ICD-10-CM

## 2018-04-05 DIAGNOSIS — T82594D Other mechanical complication of infusion catheter, subsequent encounter: Secondary | ICD-10-CM | POA: Diagnosis not present

## 2018-04-05 DIAGNOSIS — Z0289 Encounter for other administrative examinations: Secondary | ICD-10-CM

## 2018-04-05 DIAGNOSIS — L89319 Pressure ulcer of right buttock, unspecified stage: Secondary | ICD-10-CM | POA: Diagnosis not present

## 2018-04-06 LAB — VITAMIN D 25 HYDROXY (VIT D DEFICIENCY, FRACTURES): Vit D, 25-Hydroxy: 40.8 ng/mL (ref 30.0–100.0)

## 2018-04-06 LAB — FOLATE

## 2018-04-06 LAB — VITAMIN B12: Vitamin B-12: 1654 pg/mL — ABNORMAL HIGH (ref 232–1245)

## 2018-04-06 NOTE — Progress Notes (Signed)
Office: [phone]  /  Fax: [phone]   Dear Dr. Carollee [name],   Thank you for referring [name] to our clinic. The following note includes my evaluation and treatment recommendations.  HPI:   Chief Complaint: OBESITY    [name] has been referred by [name] Held, DO for consultation regarding her obesity and obesity related comorbidities.    [name] (MR# [id number]) is a 62 y.o. female who presents on 04/05/2018 for obesity evaluation and treatment. Current BMI is Body mass index is 38.04 kg/m.[name] [name] [name] has been struggling with her weight for many years and has been unsuccessful in either losing weight, maintaining weight loss, or reaching her healthy weight goal.     [name] attended our information session and states [name] is currently in the action stage of change and ready to dedicate time achieving and maintaining a healthier weight. [name] is interested in becoming our patient and working on intensive lifestyle modifications including (but not limited to) diet, exercise and weight loss.    [name] states her family eats meals together [name] thinks her family will eat healthier with  her [name] struggles with family and or coworkers weight loss sabotage her desired weight loss is 23 lbs [name] has been heavy most of  her life [name] started gaining weight during early childhood her heaviest weight ever was 298 lbs [name] has significant food cravings issues  [name] snacks frequently in the evenings [name] is frequently drinking liquids with calories [name] frequently makes poor food choices [name] has problems with excessive hunger  [name] frequently eats larger portions than normal  [name] struggles with emotional eating    Fatigue [name] feels her energy is lower than it should be. This has worsened with weight gain and has not worsened recently. [name] admits to daytime somnolence and  admits to waking up still tired. Patient is at risk for obstructive sleep apnea. Patent has a  history of symptoms of daytime fatigue. Patient generally gets 7 hours of sleep per night, and states they generally have generally restful sleep. Snoring is present. Apneic episodes are not present. Epworth Sleepiness Score is 9.  Dyspnea on exertion [name] notes increasing shortness of breath with exercising and seems to be worsening over time with weight gain. [name] notes getting out of breath sooner with activity than [name] used to. This has not gotten worse recently. [name] denies orthopnea.  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 Her last A1c was 5.3, [name] states fasting BGs range mostly between 80 and 90's, occasionally 70's and well controlled overall. [name] denies any hypoglycemic episodes. [name] has been working on intensive lifestyle modifications including diet, exercise, and weight loss to help control her blood glucose levels.  At risk for cardiovascular disease [name] is at a higher than average risk for cardiovascular disease due to obesity and diabetes II. [name] currently denies any chest pain.  Nutritional Deficiency [name] is status post multiple cancer treatments and ileostomy, and [name] has struggled with proper nutrition.   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 [name] notes it is worse with multiple severe health issues. [name] struggles with emotional eating and using food for comfort to the extent that it is negatively impacting her health. [name] often snacks when [name] is not hungry. [name] sometimes feels [name] is out of control and then feels guilty that [name] made poor food choices. [name] has  been working on behavior modification techniques to help reduce her emotional eating and has been somewhat successful. [name] shows no sign of suicidal or homicidal ideations.  Depression screen [name] 2/9 04/05/2018 06/21/2017 08/30/2016  Decreased Interest 1 0 0  Down, Depressed,  Hopeless 1 0 0  PHQ - 2 Score 2 0 0  Altered sleeping 0 - -  Tired, decreased energy 1 - -  Change in appetite 1 - -  Feeling bad or failure about yourself  0 - -  Trouble concentrating 0 - -  Moving slowly or fidgety/restless 0 - -  Suicidal thoughts 0 - -  PHQ-9 Score 4 - -  Difficult doing work/chores Somewhat difficult - -  Some recent data might be hidden    Depression Screen [name] (modified PHQ-9) score was  Depression screen PHQ 2/9 04/05/2018  Decreased Interest 1  Down, Depressed, Hopeless 1  PHQ - 2 Score 2  Altered sleeping 0  Tired, decreased energy 1  Change in appetite 1  Feeling bad or failure about yourself  0  Trouble concentrating 0  Moving slowly or fidgety/restless 0  Suicidal thoughts 0  PHQ-9 Score 4  Difficult doing work/chores Somewhat difficult  Some recent data might be hidden    ALLERGIES: Allergies  Allergen Reactions  . Penicillins Swelling    Facial swelling/childhood allergy Has patient had a PCN reaction causing immediate rash, facial/tongue/throat swelling, SOB or lightheadedness with hypotension: Yes Has patient had a PCN reaction causing severe rash involving mucus membranes or skin necrosis: Yes Has patient had a PCN reaction that required hospitalization yes Has patient had a PCN reaction occurring within the last 10 years: No If all of the above answers are "NO", then may proceed with Cephalosporin use.   . Cefaclor Rash    Ceclor  . Erythromycin Other (See Comments)    Gastritis, abd cramps  . Tape Rash    blisters  . Trimethoprim Rash  . Ultram [Tramadol] Hives  . Cephalosporins Rash  . Fluconazole Rash  . Oxycodone Other (See Comments)    " I just feel weird"  . Pectin Rash    Pectin ring for stoma  . Septra [Sulfamethoxazole-Trimethoprim] Rash  . Sulfa Antibiotics Rash    MEDICATIONS: Current Outpatient Medications on File Prior to Visit  Medication Sig Dispense Refill  . acetaminophen (TYLENOL)  325 MG tablet Take 2 tablets (650 mg total) by mouth every 6 (six) hours as needed. 30 tablet 1  . anastrozole (ARIMIDEX) 1 MG tablet TAKE 1 TABLET DAILY 90 tablet 3  . Biotin 5 MG TABS Take 5 mg by mouth every morning.     . Cholecalciferol (VITAMIN D3) 10000 UNITS capsule Take 10,000 Units by mouth once a week. Sunday evening's    . clobetasol (OLUX) 0.05 % topical foam Apply topically 2 (two) times daily.    . collagenase (SANTYL) ointment Apply 1 application topically daily as needed (skin care).     . diphenhydrAMINE (BENADRYL) 25 mg capsule Take 1 capsule (25 mg total) by mouth every 8 (eight) hours as needed for itching, allergies or sleep. 30 capsule 0  . diphenoxylate-atropine (LOMOTIL) 2.5-0.025 MG tablet Take 1 tablet by mouth 4 (four) times daily as needed for diarrhea or loose stools. TO PREVENT LOOSE BOWEL MOVEMENTS (Patient taking differently: Take 1 tablet by mouth every other day. TO PREVENT LOOSE BOWEL MOVEMENTS) 30 tablet 0  . fentaNYL (DURAGESIC) 50 MCG/HR Place 1 patch (50 mcg total) onto the  skin every 3 (three) days. 10 patch 0  . ferrous sulfate 325 (65 FE) MG tablet Take 1 tablet (325 mg total) by mouth at bedtime. 30 tablet 3  . HYDROcodone-acetaminophen (NORCO/VICODIN) 5-325 MG tablet Take 1 tablet by mouth every 6 (six) hours as needed for moderate pain. 30 tablet 0  . insulin glargine (LANTUS) 100 UNIT/ML injection Inject 14 Units into the skin 2 (two) times daily.    [name] [name] JANUVIA 50 MG tablet     . levothyroxine (SYNTHROID, LEVOTHROID) 150 MCG tablet Take 1 tablet (150 mcg total) by mouth daily before breakfast. 30 tablet 1  . loperamide (IMODIUM) 2 MG capsule Take 1-2 capsules (2-4 mg total) by mouth every 8 (eight) hours as needed for diarrhea or loose stools (Use if >2 BM every 8 hours). 30 capsule 0  . loratadine (CLARITIN) 10 MG tablet Take 10 mg by mouth every morning.     . nitrofurantoin, macrocrystal-monohydrate, (MACROBID) 100 MG capsule Take 100 mg by mouth at  bedtime.    [name] [name] omega-3 acid ethyl esters (LOVAZA) 1 G capsule Take 1 g by mouth 2 (two) times daily.     Vladimir Faster Glycol-Propyl Glycol (SYSTANE OP) Place 1 drop into both eyes daily as needed (dry eyes).     . pregabalin (LYRICA) 50 MG capsule Take 1 capsule (50 mg total) by mouth 2 (two) times daily. 180 capsule 1  . Prenatal Vit-Fe Fumarate-FA (PRENATAL VITAMIN PO) Take 1 capsule by mouth daily. Takes prenatal because there are no dyes in it    . protein supplement shake (PREMIER PROTEIN) LIQD Take 325 mLs (11 oz total) by mouth 2 (two) times daily as needed (Pt to request). 60 Can 1  . ranitidine (ZANTAC) 150 MG tablet Take 1 tablet (150 mg total) by mouth 2 (two) times daily as needed for heartburn. (Patient taking differently: Take 150 mg by mouth at bedtime. ) 60 tablet 1  . rosuvastatin (CRESTOR) 10 MG tablet Take 10 mg by mouth every evening.     . Saccharomyces boulardii (FLORASTOR PO) Take 1 capsule by mouth daily.    . sodium chloride 0.9 % injection Inject 10 mLs into the vein daily.    . Tbo-Filgrastim (GRANIX) 480 MCG/0.8ML SOSY injection Inject 0.8 mLs (480 mcg total) into the skin daily. 30 Syringe 11   No current facility-administered medications on file prior to visit.     PAST MEDICAL HISTORY: Past Medical History:  Diagnosis Date  . Adrenal adenoma, left 02/08/2016   CT: stable benign  . Anemia in neoplastic disease   . Back pain   . Benign essential HTN   . 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 Cancer of corpus uteri, except isthmus [name])  oncologist-- dr Denman [name] and dr Alvy Bimler    10-15-2004  dx endometroid endometrial and ovarian cancer s/p  chemotheapy and surgery(TAH w/ BSO) :  recurrent 11-19-2014 post pelvic surgery and radiation 01-29-2015 to 03-10-2015  . Chronic idiopathic  neutropenia (HCC)    presumed related to chemotherapy March 2006--- followed by dr Alvy Bimler (treatment w/ G-CSF injections  . Chronic nausea   . Chronic pain    perineal/ anal  area from bladder pad irritates skin , right flank pain  . CKD stage G2/A3, GFR 60-89 and albumin creatinine ratio >300 mg/g  nephrologist-  dr Madelon Lips  . Colovesical fistula   . Diabetic retinopathy, background ([place])   . Difficult intravenous access    small veins--- hx PICC lines  . DM type 2 (diabetes mellitus, type 2) ([place])    monitored by dr Legrand Como altheimer  . Dysuria   . Environmental and seasonal allergies   . Fatty liver 02/08/2016   CT  . Generalized muscle weakness   . GERD (gastroesophageal reflux disease)   . Hiatal hernia   . History of abdominal abscess 04/16/2017   post surgery 04-01-2017  --- resolved 10/ 2018  . History of gastric polyp    2014  duodenum  . History of ileus 04/16/2017   resolved w/ no surgical intervention  . History of radiation therapy    01-29-2015 to 03-10-2015  pelvis 50.4Gy  . Hypothyroidism    monitored by dr Legrand Como altheimer  . IBS (irritable bowel syndrome)   . Ileostomy in place [name]) 04/01/2017   created at same time colostomy takedown.  . Joint pain   . Leg edema   . Lower urinary tract symptoms (LUTS)    urge urinary  incontinence  . Mixed dyslipidemia   . Multiple thyroid nodules    Managed by Dr. Harlow Asa  . Nephrostomy status ([place])   . Palpitations   . Pelvic abscess in female 04/16/2017  . PONV (postoperative nausea and vomiting)    "scopolamine patch works for me"  . Radiation-induced dermatitis    contact dermatitis , radiation completed, rash only on ankles now.  . Seasonal allergies   . Ureteral stricture, right UROLOGIT-  DR [name]   CHRONIC--  TREATMENT URETERAL STENT  . 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 Vitamin D deficiency   . Wears glasses     PAST SURGICAL HISTORY: Past Surgical History:  Procedure  Laterality Date  . APPENDECTOMY    . biopsy thyroid nodules    . BREAST RECONSTRUCTION WITH PLACEMENT OF TISSUE EXPANDER AND FLEX HD (ACELLULAR HYDRATED DERMIS) Bilateral 12/11/2015   Procedure: BILATERAL BREAST RECONSTRUCTION WITH PLACEMENT OF TISSUE EXPANDERS;  Surgeon: Irene Limbo, MD;  Location: [place];  Service: Plastics;  Laterality: Bilateral;  . COLONOSCOPY WITH PROPOFOL N/A 08/21/2013   Procedure: COLONOSCOPY WITH PROPOFOL;  Surgeon: Cleotis Nipper, MD;  Location[facility];  Service: Endoscopy;  Laterality: N/A;  . COLOSTOMY TAKEDOWN N/A 12/04/2014   Procedure: LAPROSCOPIC LYSIS OF ADHESIONS, SPLENIC MOBILIZATION, RELOCATION OF COLOSTOMY, DEBRIDEMENT INITIAL COLOSTOMY SITE;  Surgeon: Michael [name], MD;  Location: [facility];  Service: General;  Laterality: N/A;  . CYSTOGRAM N/A 06/01/2017   Procedure: CYSTOGRAM;  Surgeon: Alexis Frock, MD;  [facility];  Service: Urology;  Laterality: N/A;  . CYSTOSCOPY W/ RETROGRADES Right 11/21/2015   Procedure: CYSTOSCOPY WITH RETROGRADE PYELOGRAM;  Surgeon: Alexis Frock, MD;  [facility];  Service: Urology;  Laterality: Right;  . CYSTOSCOPY W/ URETERAL STENT PLACEMENT Right 11/21/2015   Procedure: CYSTOSCOPY WITH STENT REPLACEMENT;  Surgeon: Alexis Frock, MD;  [facility];  Service: Urology;  Laterality: Right;  . CYSTOSCOPY W/ URETERAL STENT PLACEMENT Right 03/10/2016   Procedure: CYSTOSCOPY WITH STENT REPLACEMENT;  Surgeon: Alexis Frock, MD;  Location: [name];  Service: Urology;  Laterality: Right;  . CYSTOSCOPY W/ URETERAL STENT PLACEMENT Right 06/30/2016   Procedure: CYSTOSCOPY WITH RETROGRADE PYELOGRAM/URETERAL STENT EXCHANGE;  Surgeon: Alexis Frock, MD;  Location: [name];  Service: Urology;  Laterality: Right;  . CYSTOSCOPY W/ URETERAL STENT PLACEMENT N/A 06/01/2017  Procedure: CYSTOSCOPY WITH EXAM UNDER ANESTHESIA;  Surgeon: Alexis Frock, MD;  [facility];  Service:  Urology;  Laterality: N/A;  . CYSTOSCOPY W/ URETERAL STENT PLACEMENT Right 08/17/2017   Procedure: CYSTOSCOPY WITH RETROGRADE PYELOGRAM/URETERAL STENT REMOVAL;  Surgeon: Alexis Frock, MD;  [name];  Service: Urology;  Laterality: Right;  . CYSTOSCOPY WITH RETROGRADE PYELOGRAM, URETEROSCOPY AND STENT PLACEMENT Right 03/20/2015   Procedure: CYSTOSCOPY WITH RETROGRADE PYELOGRAM, URETEROSCOPY WITH BALLOON DILATION AND STENT PLACEMENT ON RIGHT;  Surgeon: Alexis Frock, MD;  [name];  Service: Urology;  Laterality: Right;  . CYSTOSCOPY WITH RETROGRADE PYELOGRAM, URETEROSCOPY AND STENT PLACEMENT Right 05/02/2015   Procedure: CYSTOSCOPY WITH RIGHT RETROGRADE PYELOGRAM,  DIAGNOSTIC URETEROSCOPY AND STENT PULL ;  Surgeon: Alexis Frock, MD;  [name];  Service: Urology;  Laterality: Right;  . CYSTOSCOPY WITH RETROGRADE PYELOGRAM, URETEROSCOPY AND STENT PLACEMENT Right 09/05/2015   Procedure: CYSTOSCOPY WITH RETROGRADE PYELOGRAM,  AND STENT PLACEMENT;  Surgeon: Alexis Frock, MD;  [facility];  Service: Urology;  Laterality: Right;  . CYSTOSCOPY WITH RETROGRADE PYELOGRAM, URETEROSCOPY AND STENT PLACEMENT Right 04/01/2017   Procedure: CYSTOSCOPY WITH RETROGRADE PYELOGRAM, URETEROSCOPY AND STENT PLACEMENT;  Surgeon: Alexis Frock, MD;  [facility];  Service: Urology;  Laterality: Right;  . CYSTOSCOPY WITH STENT PLACEMENT Right 10/27/2016   Procedure: CYSTOSCOPY WITH STENT CHANGE and right retrograde pyelogram;  Surgeon: Alexis Frock, MD;  Location: [facility];  Service: Urology;  Laterality: Right;  . EUS N/A 10/02/2014   Procedure: LOWER ENDOSCOPIC ULTRASOUND (EUS);  Surgeon: Arta Silence, MD;  Location: [facility];  Service: Endoscopy;  Laterality: N/A;  . EXCISION SOFT TISSUE MASS RIGHT FOREMAN  12-08-2006  . EYE SURGERY  as child   pytosis of eyelids repair  . INCISION AND DRAINAGE OF WOUND  Bilateral 12/26/2015   Procedure: DEBRIDEMENT OF BILATERAL MASTECTOMY FLAPS;  Surgeon: Irene Limbo, MD;  Location: [place];  Service: Plastics;  Laterality: Bilateral;  . IR CV LINE INJECTION  05/31/2017  . IR FLUORO GUIDE CV LINE LEFT  05/31/2017  . IR FLUORO GUIDE CV LINE RIGHT  04/06/2017  . IR FLUORO GUIDE CV MIDLINE PICC RIGHT  05/30/2017  . IR NEPHROSTOGRAM LEFT INITIAL PLACEMENT  09/02/2017  . IR NEPHROSTOGRAM LEFT THRU EXISTING ACCESS  11/29/2017  . IR NEPHROSTOGRAM RIGHT INITIAL PLACEMENT  09/02/2017  . IR NEPHROSTOGRAM RIGHT THRU EXISTING ACCESS  09/13/2017  . IR NEPHROSTOGRAM RIGHT THRU EXISTING ACCESS  11/29/2017  . IR NEPHROSTOMY EXCHANGE LEFT  11/28/2017  . IR NEPHROSTOMY EXCHANGE LEFT  01/05/2018  . IR NEPHROSTOMY EXCHANGE LEFT  02/16/2018  . IR NEPHROSTOMY EXCHANGE LEFT  03/30/2018  . IR NEPHROSTOMY EXCHANGE RIGHT  10/02/2017  . IR NEPHROSTOMY EXCHANGE RIGHT  11/28/2017  . IR NEPHROSTOMY EXCHANGE RIGHT  01/05/2018  . IR NEPHROSTOMY EXCHANGE RIGHT  02/16/2018  . IR NEPHROSTOMY EXCHANGE RIGHT  03/30/2018  . IR NEPHROSTOMY PLACEMENT LEFT  10/02/2017  . IR RADIOLOGIST EVAL & MGMT  05/03/2017  . IR US GUIDE VASC ACCESS LEFT  05/31/2017  . IR US GUIDE VASC ACCESS RIGHT  04/06/2017  . IR US GUIDE VASC ACCESS RIGHT  05/30/2017  . LAPAROSCOPIC CHOLECYSTECTOMY  1990  . LIPOSUCTION WITH LIPOFILLING Bilateral 04/16/2016   Procedure: LIPOSUCTION WITH LIPOFILLING TO BILATERAL CHEST;  Surgeon: Irene Limbo, MD;  Location: [place];  Service: Plastics;  Laterality: Bilateral;  . MASTECTOMY W/ SENTINEL NODE BIOPSY Bilateral 12/11/2015   Procedure:  RIGHT PROPHYLACTIC MASTECTOMY, LEFT TOTAL MASTECTOMY WITH LEFT AXILLARY SENTINEL LYMPH NODE BIOPSY;  Surgeon: Stark Klein, MD;  Location: [place];  Service: General;  Laterality: Bilateral;  . OSTOMY N/A 11/19/2014   Procedure: OSTOMY;  Surgeon: Michael [name], MD;  [facility];  Service: General;  Laterality: N/A;  .  PROCTOSCOPY N/A 04/01/2017   Procedure: RIDGE PROCTOSCOPY;  Surgeon: Michael [name], MD;  [facility];  Service: General;  Laterality: N/A;  . REMOVAL OF BILATERAL TISSUE EXPANDERS WITH PLACEMENT OF BILATERAL BREAST IMPLANTS Bilateral 04/16/2016   Procedure: REMOVAL OF BILATERAL TISSUE EXPANDERS WITH PLACEMENT OF BILATERAL BREAST IMPLANTS;  Surgeon: Irene Limbo, MD;  Location: [place];  Service: Plastics;  Laterality: Bilateral;  . ROBOTIC ASSISTED LAP VAGINAL HYSTERECTOMY N/A 11/19/2014   Procedure: ROBOTIC LYSIS OF ADHESIONS, CONVERTED TO LAPAROTOMY RADICAL UPPER VAGINECTOMY,LOW ANTERIOR BOWEL RESECTION, COLOSTOMY, BILATERAL URETERAL STENT PLACEMENT AND CYSTONOMY CLOSURE;  Surgeon: Everitt Amber, MD;  [facility];  Service: Gynecology;  Laterality: N/A;  . TISSUE EXPANDER FILLING Bilateral 12/26/2015   Procedure: EXPANSION OF BILATERAL CHEST TISSUE EXPANDERS (60 mL- Right; 75 mL- Left);  Surgeon: Irene Limbo, MD;  Location: [place];  Service: Plastics;  Laterality: Bilateral;  . TONSILLECTOMY    . TOTAL ABDOMINAL HYSTERECTOMY  March 2006   [facility]   and Bilateral Salpingoophorectomy/  staging for Ovarian cancer/  an  . XI ROBOTIC ASSISTED LOWER ANTERIOR RESECTION N/A 04/01/2017   Procedure: XI ROBOTIC VS LAPAROSCOPIC COLOSTOMY TAKEDOWN WITH LYSIS OF ADHESIONS.;  Surgeon: Michael [name], MD;  [facility];  Service: General;  Laterality: N/A;  ERAS PATHWAY    SOCIAL HISTORY: Social History   Tobacco Use  . Smoking status: Never Smoker  . Smokeless tobacco: Never Used  Substance Use Topics  . Alcohol use: Not Currently  . Drug use: No    FAMILY HISTORY: Family History  Problem Relation Age of Onset  . Cancer Mother 62       stomach ca  . Hypertension Mother   . Cancer Father 62       prostate ca  . Diabetes Father   . Heart disease Father        CABG  . Hypertension Father   . Hyperlipidemia Father   . Obesity Father   . Breast  cancer Maternal Aunt        dx in her 75s  . Lymphoma Paternal Aunt   . Brain cancer Paternal Grandfather   . Ovarian cancer Other   . Diabetes Sister   . Hypertension Brother y-10  . Heart disease Brother        CABG  . Diabetes Brother     ROS: Review of Systems  Constitutional: Positive for malaise/fatigue. Negative for weight loss.  HENT: Positive for tinnitus.   Eyes: Positive for redness.       + Wear glasses  Respiratory: Positive for shortness of breath.   Cardiovascular: Positive for palpitations. Negative for chest pain and orthopnea.       + Very cold feet or hands  Gastrointestinal: Positive for diarrhea and heartburn (very rare).       + Rectal bleeding (very rare)  Musculoskeletal: Positive for back pain.       + Neck stiffness + Muscle stiffness  Skin: Positive for itching (duet to adhesives) and rash (due to adhesives).       + Dryness + Hair or nail changes  Endo/Heme/Allergies: Bruises/bleeds easily.  Negative hypoglycemia + Heat/cold intolerance     PHYSICAL EXAM: Blood pressure 119/66, pulse 71, temperature 97.6 F (36.4 C), temperature source Oral, height 5' 2"  (1.575 m), weight 208 lb (94.3 kg), SpO2 94 %. Body mass index is 38.04 kg/m. Physical Exam  Constitutional: [name] is oriented to person, place, and time. [name] appears well-developed and well-nourished.  HENT:  Head: Normocephalic and atraumatic.  Nose: Nose normal.  Eyes: EOM are normal. No scleral icterus.  Neck: Normal range of motion. Neck supple. No thyromegaly present.  Cardiovascular: Normal rate and regular rhythm.  Pulmonary/Chest: Effort normal. No respiratory distress.  Abdominal: Soft. There is no tenderness.  + Obesity  Musculoskeletal:  Range of Motion normal in all 4 extremities Trace edema noted in bilateral lower extremities  Neurological: [name] is alert and oriented to person, place, and time. Coordination normal.  Skin: Skin is warm and dry.  Psychiatric: [name] has a  normal [name] and affect. Her behavior is normal.  Vitals reviewed.   RECENT LABS AND TESTS: BMET    Component Value Date/Time   NA 137 03/27/2018 1324   NA 141 01/24/2017 1228   K 4.0 03/27/2018 1324   K 4.6 01/24/2017 1228   CL 104 03/27/2018 1324   CO2 26 03/27/2018 1324   CO2 29 01/24/2017 1228   GLUCOSE 141 (H) 03/27/2018 1324   GLUCOSE 84 01/24/2017 1228   BUN 34 (H) 03/27/2018 1324   BUN 22.2 01/24/2017 1228   CREATININE 1.98 (H) 03/27/2018 1324   CREATININE 0.8 01/24/2017 1228   CALCIUM 9.7 03/27/2018 1324   CALCIUM 10.2 01/24/2017 1228   GFRNONAA 26 (L) 03/27/2018 1324   GFRNONAA 78 12/16/2014 1530   GFRAA 30 (L) 03/27/2018 1324   GFRAA >89 12/16/2014 1530   Lab Results  Component Value Date   HGBA1C 7.9 (H) 09/01/2017   No results found for: INSULIN CBC    Component Value Date/Time   WBC 5.1 03/27/2018 1324   WBC 2.6 (L) 12/01/2017 0406   RBC 3.62 (L) 03/27/2018 1324   HGB 10.2 (L) 03/27/2018 1324   HGB 12.4 07/29/2017 1444   HCT 31.6 (L) 03/27/2018 1324   HCT 38.0 07/29/2017 1444   PLT 268 03/27/2018 1324   PLT 260 07/29/2017 1444   MCV 87.2 03/27/2018 1324   MCV 90.9 07/29/2017 1444   MCH 28.1 03/27/2018 1324   MCHC 32.2 03/27/2018 1324   RDW 15.8 (H) 03/27/2018 1324   RDW 15.5 (H) 07/29/2017 1444   LYMPHSABS 0.5 (L) 03/27/2018 1324   LYMPHSABS 0.8 (L) 07/29/2017 1444   MONOABS 0.9 03/27/2018 1324   MONOABS 1.0 (H) 07/29/2017 1444   EOSABS 0.1 03/27/2018 1324   EOSABS 0.0 07/29/2017 1444   BASOSABS 0.1 03/27/2018 1324   BASOSABS 0.0 07/29/2017 1444   Iron/TIBC/Ferritin/ %Sat    Component Value Date/Time   IRON 7 (L) 08/31/2017 0451   IRON 12 (L) 11/29/2014 1251   TIBC 164 (L) 08/31/2017 0451   TIBC 331 11/29/2014 1251   FERRITIN 27 11/29/2014 1251   IRONPCTSAT 4 (L) 08/31/2017 0451   IRONPCTSAT 4 (L) 11/29/2014 1251   Lipid Panel     Component Value Date/Time   CHOL 109 08/28/2015 1617   TRIG 89 04/18/2017 0427   HDL 43.80  08/28/2015 1617   CHOLHDL 2 08/28/2015 1617   VLDL 19.8 08/28/2015 1617   LDLCALC 45 08/28/2015 1617   Hepatic Function Panel     Component Value Date/Time   PROT 8.3 (H) 03/27/2018  1324   PROT 6.9 01/24/2017 1228   ALBUMIN 3.0 (L) 03/27/2018 1324   ALBUMIN 3.4 (L) 01/24/2017 1228   AST 14 (L) 03/27/2018 1324   AST 16 01/24/2017 1228   ALT 18 03/27/2018 1324   ALT 14 01/24/2017 1228   ALKPHOS 202 (H) 03/27/2018 1324   ALKPHOS 90 01/24/2017 1228   BILITOT 0.2 (L) 03/27/2018 1324   BILITOT 0.34 01/24/2017 1228      Component Value Date/Time   TSH 2.408 08/30/2017 0623   TSH 0.63 08/28/2015 1617    ECG  shows NSR with a rate of 68 BPM INDIRECT CALORIMETER done today shows a VO2 of 271 and a REE of 1884.  Her calculated basal metabolic rate is 7494 thus her basal metabolic rate is better than expected.    ASSESSMENT AND PLAN: Other fatigue - Plan: EKG 12-Lead  Shortness of breath on exertion  Type 2 diabetes mellitus with diabetic nephropathy, with long-term current use of insulin ([place]) - Plan: VITAMIN D 25 Hydroxy (Vit-D Deficiency, Fractures), Vitamin B12, Folate  Nutritional deficiency - Plan: VITAMIN D 25 Hydroxy (Vit-D Deficiency, Fractures), Vitamin B12, Folate  Other depression - with emotional eating  At risk for heart disease  Class 2 severe obesity with serious comorbidity and body mass index (BMI) of 38.0 to 38.9 in adult, unspecified obesity type ([place])  PLAN:  Fatigue Dayanara was informed that her fatigue may be related to obesity, depression or many other causes. Labs will be ordered, and in the meanwhile Kaitlyn has agreed to work on diet, exercise and weight loss to help with fatigue. Proper sleep hygiene was discussed including the need for 7-8 hours of quality sleep each night. A sleep study was not ordered based on symptoms and Epworth score.  Dyspnea on exertion Mikelle's shortness of breath appears to be obesity related and exercise induced. [name] has  agreed to work on weight loss and gradually increase exercise to treat her exercise induced shortness of breath. If Accalia follows our instructions and loses weight without improvement of her shortness of breath, we will plan to refer to pulmonology. We will monitor this condition regularly. Hedaya agrees to this plan.  Diabetes II on Insulin with Nephropathy [name] has been given extensive diabetes education by myself today including ideal fasting and post-prandial blood glucose readings, individual ideal Hgb A1c goals and hypoglycemia prevention. We discussed the importance of good blood sugar control to decrease the likelihood of diabetic complications such as nephropathy, neuropathy, limb loss, blindness, coronary artery disease, and death. We discussed the importance of intensive lifestyle modification including diet, exercise and weight loss as the first line treatment for diabetes. Neshia agrees to discontinue Novalog when start diet prescription, and [name] is to continue Lantus and Januvia. We will check labs and [name] agrees to follow up with our clinic in 2 weeks with myself and Dr. Mallie Mussel, our bariatric psychologist.  Cardiovascular risk counselling [name] was given extended (15 minutes) coronary artery disease prevention counseling today. [name] is 62 y.o. female and has risk factors for heart disease including obesity and diabetes II. We discussed intensive lifestyle modifications today with an emphasis on specific weight loss instructions and strategies. Pt was also informed of the importance of increasing exercise and decreasing saturated fats to help prevent heart disease.  Nutritional Deficiency We will check labs and [name] agrees to follow up with our clinic in 2 weeks with myself and Dr. Mallie Mussel, our bariatric psychologist.  Depression with Emotional Eating Behaviors We discussed behavior  modification techniques today to help [name] deal with her emotional eating and depression. We will refer to  Dr. Mallie Mussel for evaluation. [name] agrees to follow up with our clinic in 2 weeks with myself and Dr. Mallie Mussel, our bariatric psychologist.   Depression Screen [name] had a negative depression screening. Depression is commonly associated with obesity and often results in emotional eating behaviors. We will monitor this closely and work on CBT to help improve the non-hunger eating patterns. Referral to Psychology may be required if no improvement is seen as [name] continues in our clinic.  Obesity Mirely is currently in the action stage of change and her goal is to continue with weight loss efforts. I recommend [name] begin the structured treatment plan as follows:  [name] has agreed to keep a food journal with 1200-1400 calories and 60-80 grams of protein daily, with 25 grams of fiber Nahomy has been instructed to eventually work up to a goal of 150 minutes of combined cardio and strengthening exercise per week for weight loss and overall health benefits. We discussed the following Behavioral Modification Strategies today: increasing lean protein intake, decreasing simple carbohydrates  and work on meal planning and easy cooking plans   [name] was informed of the importance of frequent follow up visits to maximize her success with intensive lifestyle modifications for her multiple health conditions. [name] was informed we would discuss her lab results at her next visit unless there is a critical issue that needs to be addressed sooner. [name] agreed to keep her next visit at the agreed upon time to discuss these results.    OBESITY BEHAVIORAL INTERVENTION VISIT  Today's visit was # 1   Starting weight: 208 lbs Starting date: 04/05/18 Today's weight : 208 lbs Today's date: 04/05/2018 Total lbs lost to date: 0    ASK: We discussed the diagnosis of obesity with [name] Bake Weimer today and Stepanie agreed to give [place] permission to discuss obesity behavioral modification therapy today.  ASSESS: Charl has the diagnosis  of obesity and her BMI today is 38.03 Sache is in the action stage of change   ADVISE: Noralyn was educated on the multiple health risks of obesity as well as the benefit of weight loss to improve her health. [name] was advised of the need for long term treatment and the importance of lifestyle modifications to improve her current health and to decrease her risk of future health problems.  AGREE: Multiple dietary modification options and treatment options were discussed and  Belem agreed to follow the recommendations documented in the above note.  ARRANGE: Amylynn was educated on the importance of frequent visits to treat obesity as outlined per CMS and USPSTF guidelines and agreed to schedule her next follow up appointment today.  I, Trixie Dredge, am acting as transcriptionist for Dennard Nip, MD   I have reviewed the above documentation for accuracy and completeness, and I agree with the above. -Dennard Nip, MD

## 2018-04-07 DIAGNOSIS — T82594D Other mechanical complication of infusion catheter, subsequent encounter: Secondary | ICD-10-CM | POA: Diagnosis not present

## 2018-04-07 IMAGING — CT CT ABD-PELV W/ CM
2 of 5 series · 15 of 46 positions shown, 17 images · IV contrast (ISOVUE)
Comparison: CT 01/27/2017

CLINICAL DATA: Abdominal pain. Decreased ostomy output. Post low
anterior resection 2 weeks prior.

EXAM:
CT ABDOMEN AND PELVIS WITH CONTRAST
TECHNIQUE: Multidetector CT imaging of the abdomen and pelvis was performed
using the standard protocol following bolus administration of
intravenous contrast.
CONTRAST:  100mL 3M19N3-FAA IOPAMIDOL (3M19N3-FAA) INJECTION 61%

[Series 2: abd/pel with · axial · 0.92mm/px · z∈[-505,-120]mm · 12 of 91 slices shown, 14 images]
[im 7/91  soft-tissue]
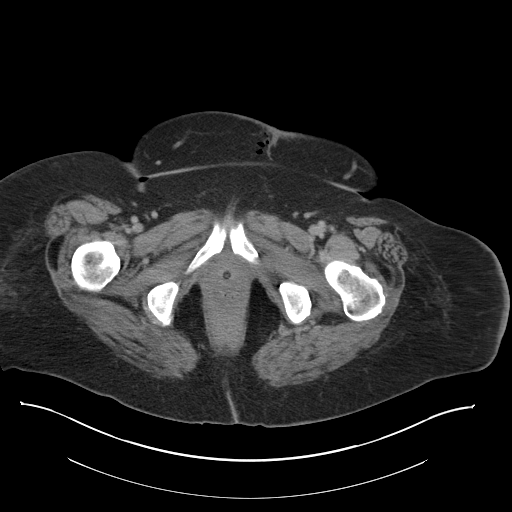
[im 7/91  bone]
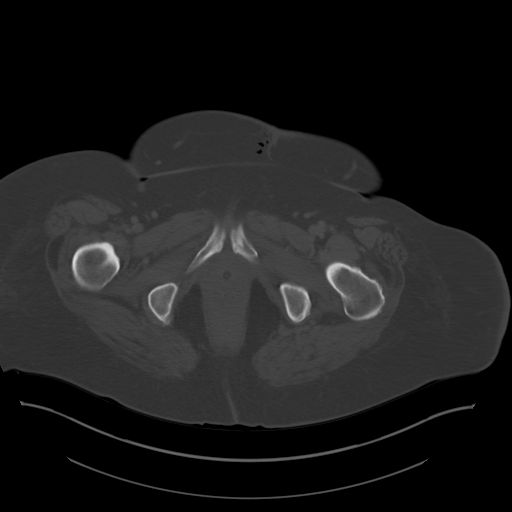
[im 14/91  soft-tissue]
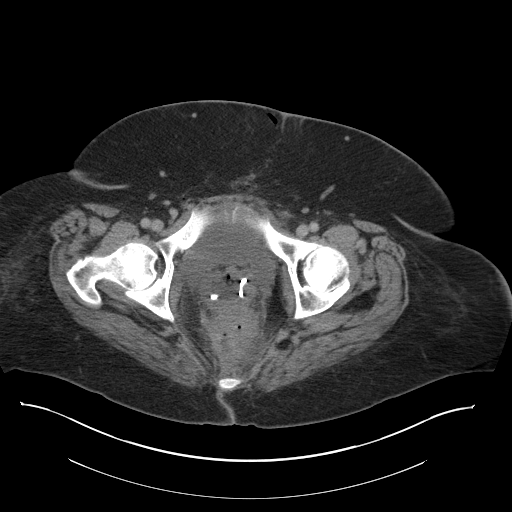
[im 21/91  soft-tissue]
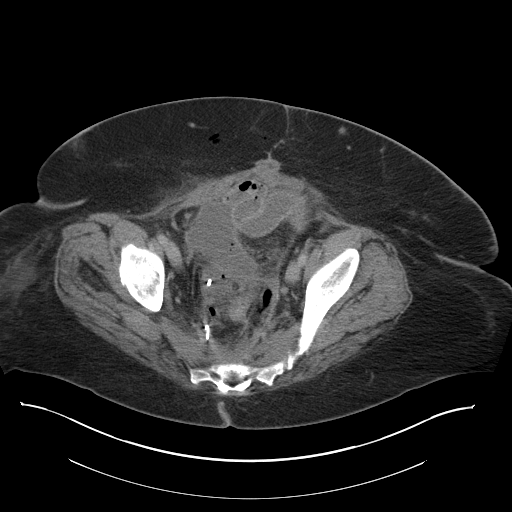
[im 28/91  soft-tissue]
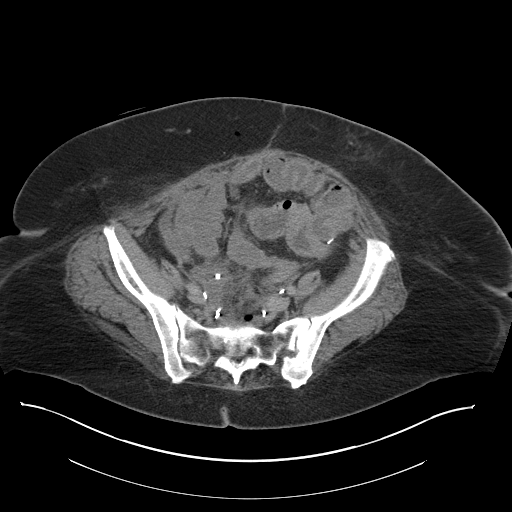
[im 35/91  soft-tissue]
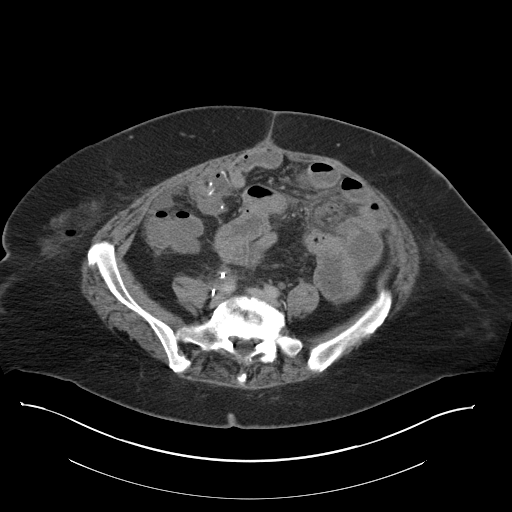
[im 42/91  soft-tissue]
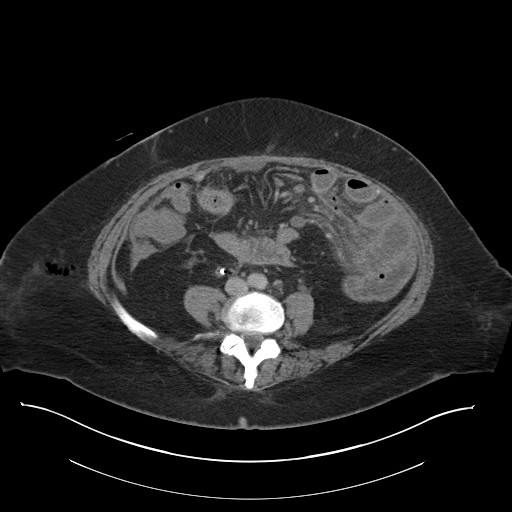
[im 49/91  soft-tissue]
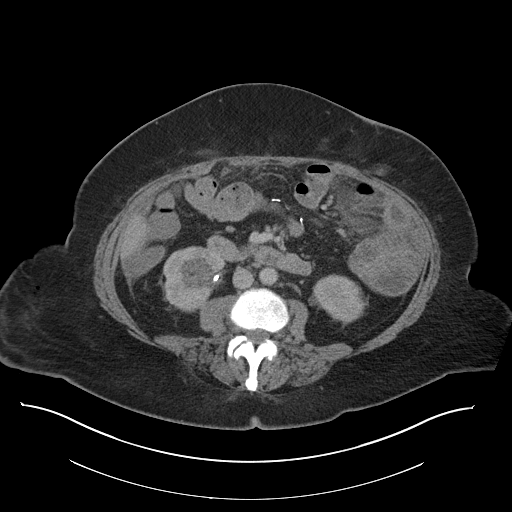
[im 56/91  soft-tissue]
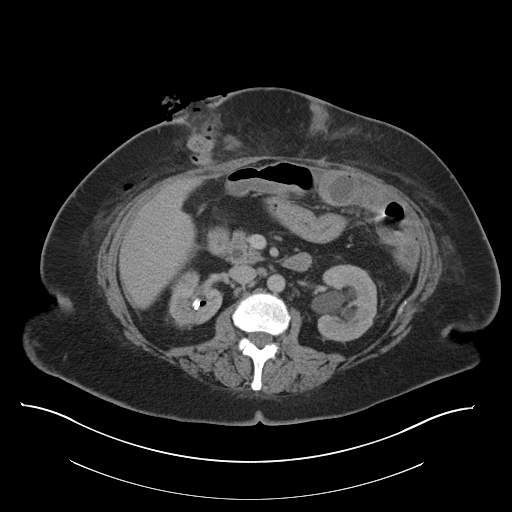
[im 63/91  soft-tissue]
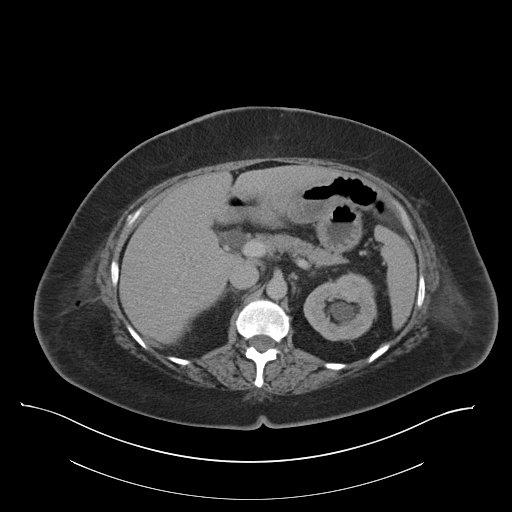
[im 63/91  bone]
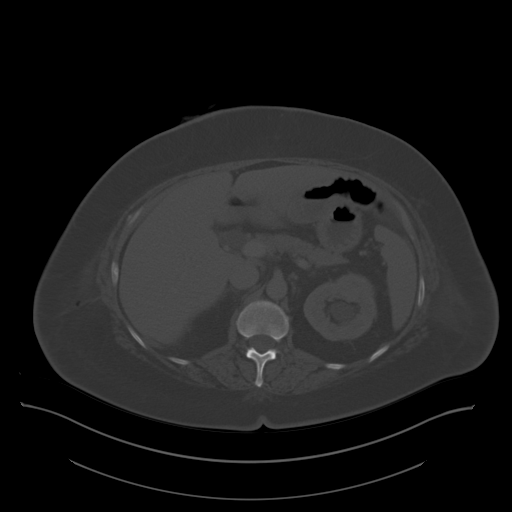
[im 70/91  soft-tissue]
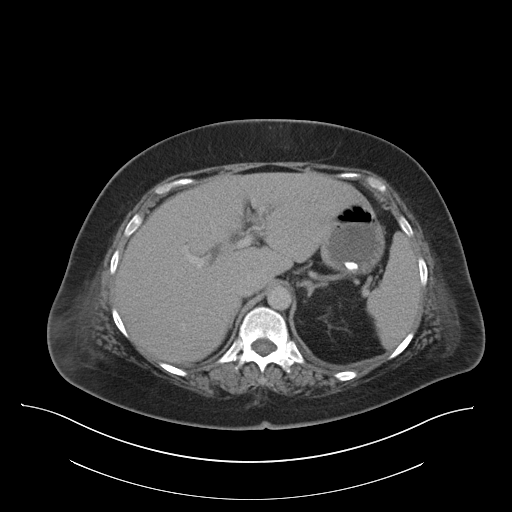
[im 77/91  soft-tissue]
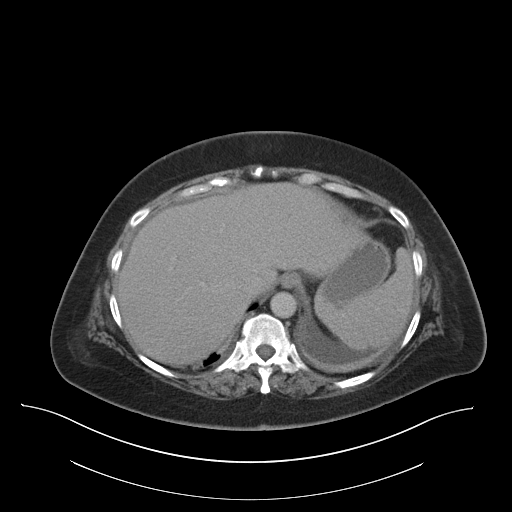
[im 84/91  soft-tissue]
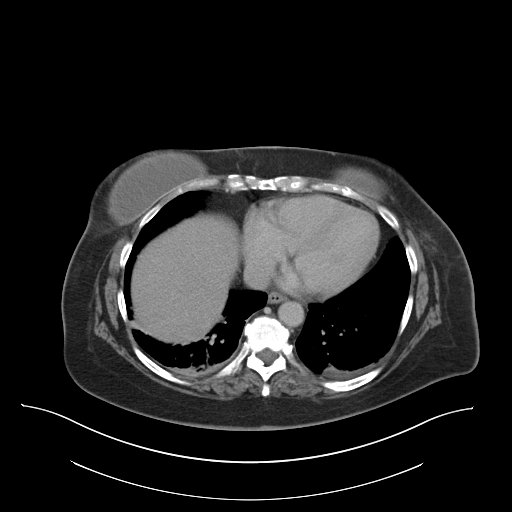

[Series 5: coronal a/|p · coronal · 0.79mm/px · 3 of 155 slices shown]
[im 52/155  soft-tissue]
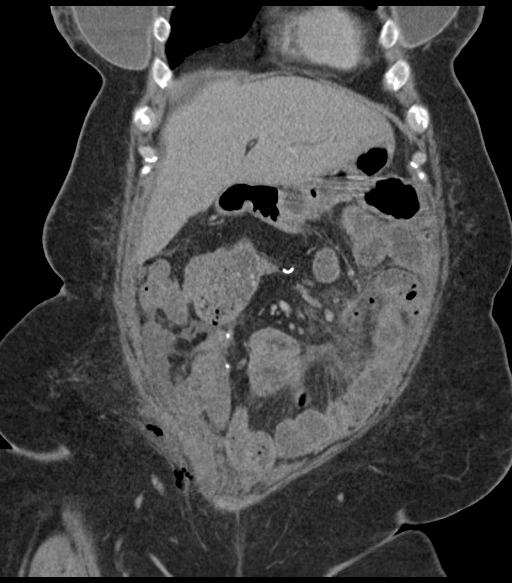
[im 69/155  soft-tissue]
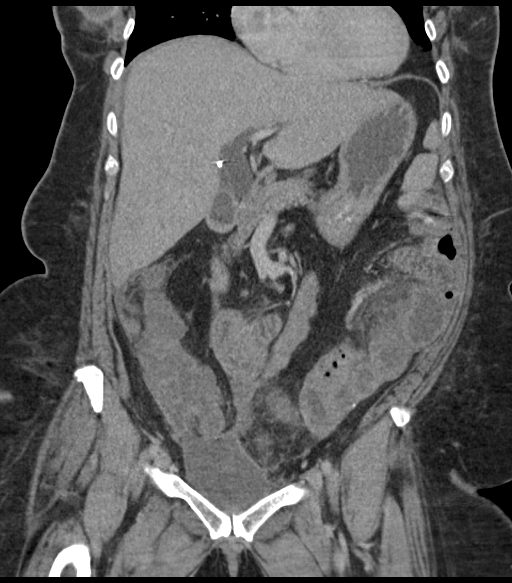
[im 86/155  soft-tissue]
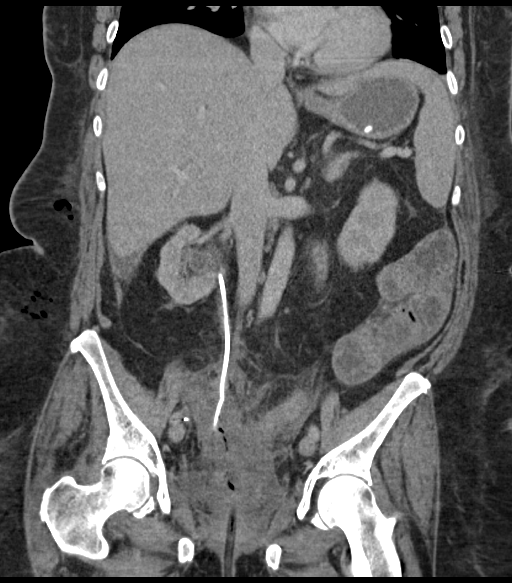

[15 of 46 positions shown; findings below may reference images not displayed]

FINDINGS: Lower chest: Bilateral lower lobe airspace opacities with air
bronchograms, right greater than left. Trace bilateral pleural
effusions.

Hepatobiliary: No focal hepatic lesion. Postcholecystectomy with
similar biliary prominence prior exam, common bile duct measures 15
mm. Normal tapering to the duodenal insertion.

Pancreas: No ductal dilatation or inflammation.

Spleen: Normal in size without focal abnormality.

Adrenals/Urinary Tract: Normal right adrenal gland. Unchanged left
adrenal adenoma

Right ureteral stent in place. Decreased hydronephrosis from prior
exam. There is increased left hydronephrosis from prior. Left ureter
is dilated to the level of pelvic brim. Urinary bladder is
decompressed by Foley catheter. Air in the bladder and right renal
collecting system consistent with recent procedure.

Stomach/Bowel: Lack of enteric contrast limits detailed assessment.
Right upper quadrant ileostomy without parastomal hernia. Stomach is
physiologically distended. There are fluid-filled prominent small
bowel loops in the left abdomen with associated mesenteric edema.
Multiple small foci of air in the left small bowel mesentery in the
region of inflammatory change. Stapled off sigmoid colon in the
pelvis. There is an adjacent irregular fluid collection in the
presacral space, ill-defined margins. Measurements difficult to
define due to irregular nature but measures approximately 5 cm.
Additional pelvic fluid collection anterior to the urinary bladder
containing air measures 8.1 x 7.3 cm.

Vascular/Lymphatic: Normal caliber abdominal aorta. Multiple small
retroperitoneal lymph nodes. No evidence pelvic adenopathy.

Reproductive: Status post hysterectomy. No adnexal masses.

Other: Presacral air fluid collection, ill-defined. Pelvic air fluid
collection measures 8.1 x 7.3 cm anterior to the urinary bladder.
Postsurgical change in the anterior abdominal wall with multiple
foci of subcutaneous air. Ill-defined fluid in the anterior upper
abdominal wall without confluent well-defined abscess.

Musculoskeletal: There are no acute or suspicious osseous
abnormalities.
IMPRESSION: 1. Post low anterior resection 2 weeks prior with pelvic abscess
anterior to the bladder measuring 8.1 x 7.3 cm. Additionally
ill-defined air fluid collection adjacent to the stapled off sigmoid
colon in the presacral space.
2. Edematous small bowel loops in the left abdomen with mesenteric
edema and multiple foci of mesenteric air. Extraluminal air should
have resolved weeks postsurgical, raising concern for ischemia or
bowel perforation.
3. Mild increase in left hydroureteronephrosis, with transition at
the pelvic brim. Decreased right hydronephrosis with right ureteral
stent in place.
4. Scattered air in the subcutaneous tissues of the anterior
abdominal wall, which may be postoperative, however unusual
duration.
These results were called by telephone at the time of interpretation
on 04/16/2017 at [DATE] to Dr. MEISHA EGUIA , who verbally
acknowledged these results.

## 2018-04-07 IMAGING — CT CT IMAGE GUIDED DRAINAGE BY PERCUTANEOUS CATHETER
1 of 5 series · 10 of 32 positions shown, 16 images · non-contrast
Comparison: CT abdomen pelvis - 04/17/2019

INDICATION: History of endometrial/ovarian cancer initially diagnosed in 8667
with pelvic recurrence in 4869 for which she underwent a colectomy,
colostomy and pelvic radiation complicated by development of a right
ureteral stricture treated with right-sided ureteral stent.

[Series 2: i-spiral 5.0 b31f · axial · 0.73mm/px · z∈[+1244,+1370]mm · 10 of 44 slices shown, 16 images]
[im 4/44  soft-tissue]
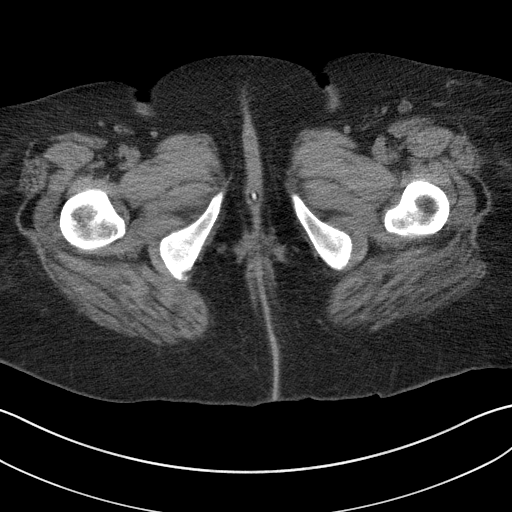
[im 4/44  bone]
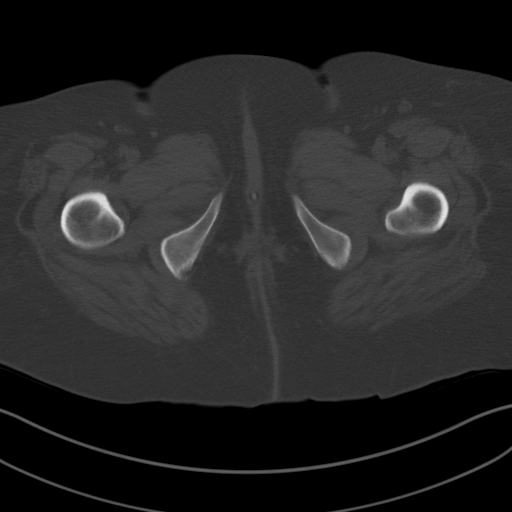
[im 8/44  soft-tissue]
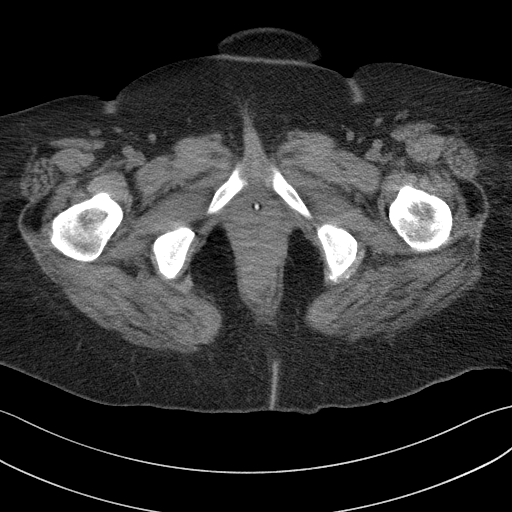
[im 12/44  soft-tissue]
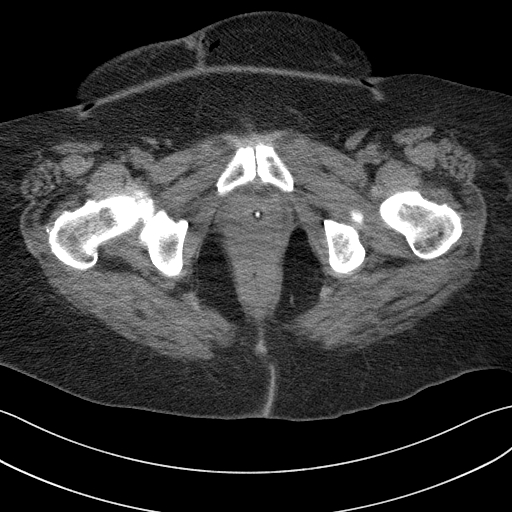
[im 16/44  soft-tissue]
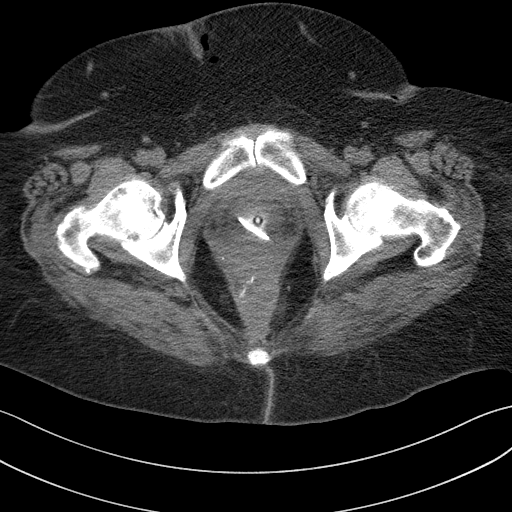
[im 20/44  soft-tissue]
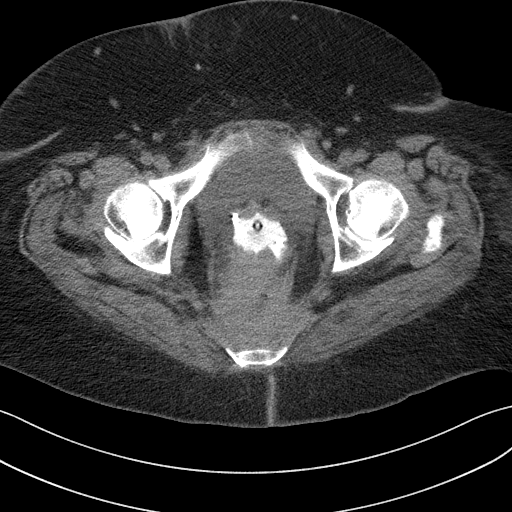
[im 24/44  soft-tissue]
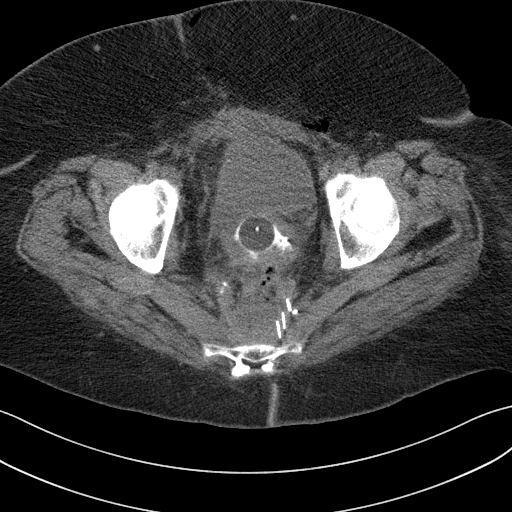
[im 28/44  soft-tissue]
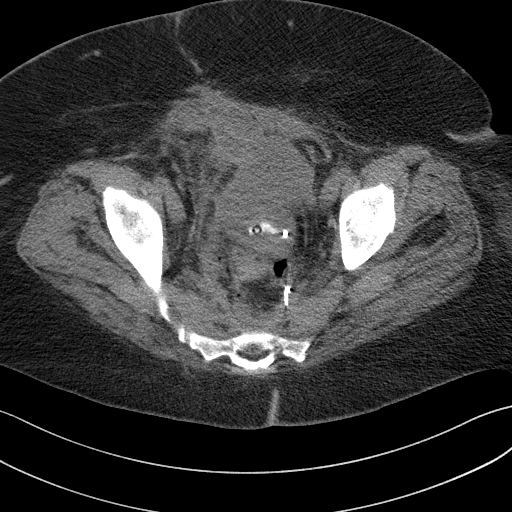
[im 28/44  lung]
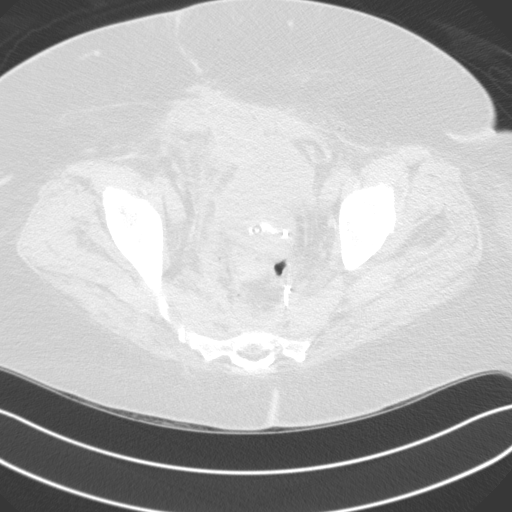
[im 32/44  soft-tissue]
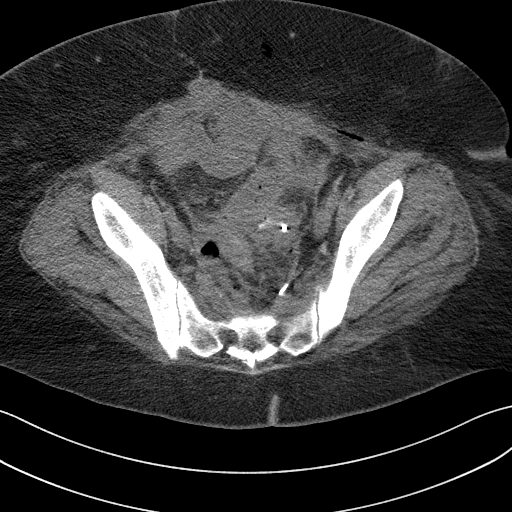
[im 32/44  lung]
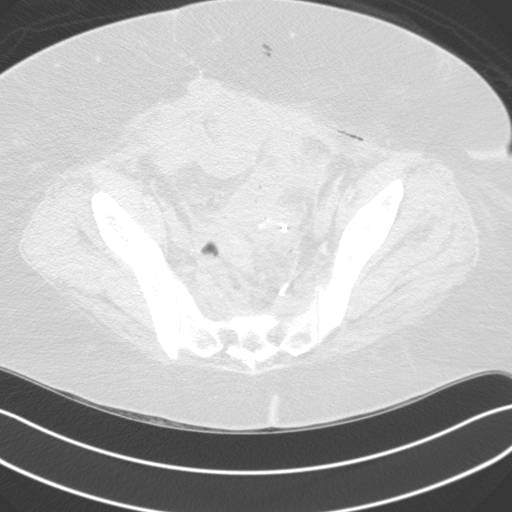
[im 36/44  soft-tissue]
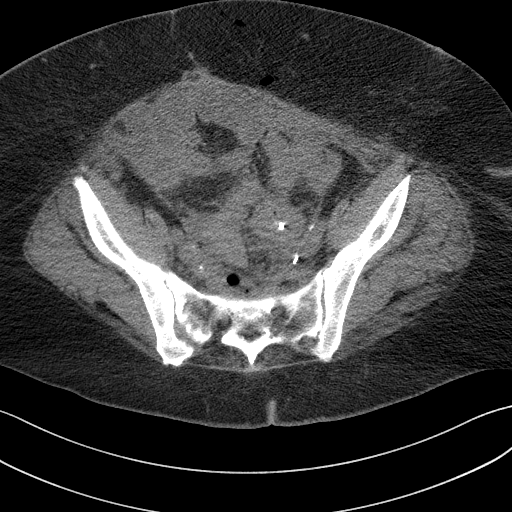
[im 36/44  lung]
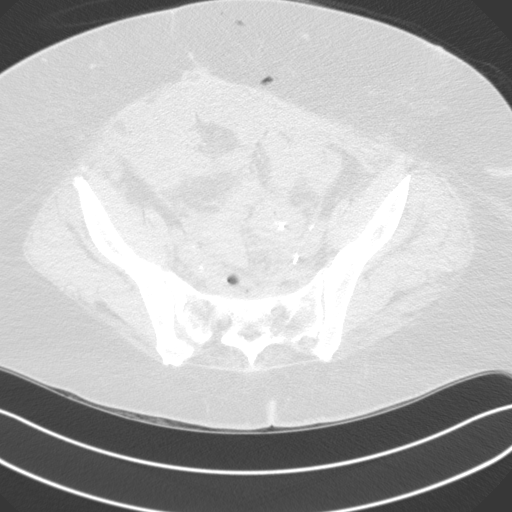
[im 36/44  bone]
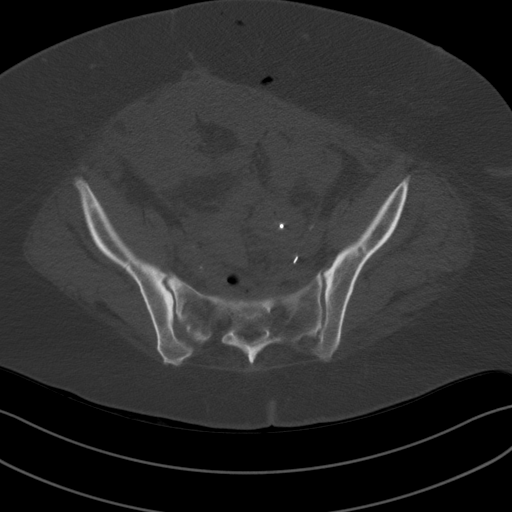
[im 40/44  soft-tissue]
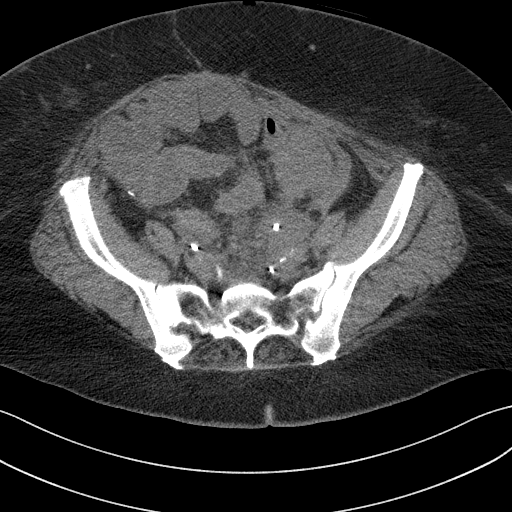
[im 40/44  lung]
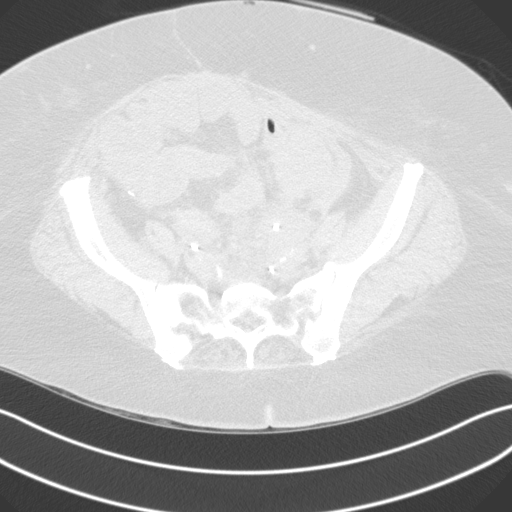

[10 of 32 positions shown; findings below may reference images not displayed]

Patient underwent a complicated MULLIGAN procedure approximately 2 weeks
ago by Dr. Reggi, however returned to the emergency department last
evening with fever and decreased output from her ileostomy. CT
demonstrated an indeterminate fluid collection within the midline of
the lower abdomen / pelvis and as such, request made for placement
of a CT-guided drainage catheter placement for infection source
control purposes.

EXAM:
CT IMAGE GUIDED DRAINAGE BY PERCUTANEOUS CATHETER
MEDICATIONS:
The patient is currently admitted to the hospital and receiving
intravenous antibiotics. The antibiotics were administered within an
appropriate time frame prior to the initiation of the procedure.

ANESTHESIA/SEDATION:
Moderate (conscious) sedation was employed during this procedure. A
total of Versed 3 mg and Fentanyl 100 mcg was administered
intravenously.

Moderate Sedation Time: 17 minutes. The patient's level of
consciousness and vital signs were monitored continuously by
radiology nursing throughout the procedure under my direct
supervision.

CONTRAST:  None

COMPLICATIONS:
None immediate.

PROCEDURE:
Informed written consent was obtained from the patient after a
discussion of the risks, benefits and alternatives to treatment. The
patient was placed supine on the CT gantry and a pre procedural CT
was performed re-demonstrating the known abscess/fluid collection
within the midline of the lower abdomen and pelvis with dominant
component measuring approximately 7.4 x 6.0 cm (image 21, series 2).
The procedure was planned. A timeout was performed prior to the
initiation of the procedure.

The skin overlying the ventral aspect the right lower abdomen/pelvis
was prepped and draped in the usual sterile fashion. The overlying
soft tissues were anesthetized with 1% lidocaine with epinephrine.
Appropriate trajectory was planned with the use of a 22 gauge spinal
needle. An 18 gauge trocar needle was advanced into the
abscess/fluid collection and a short Amplatz super stiff wire was
coiled within the collection. Appropriate positioning was confirmed
with a limited CT scan. The tract was serially dilated allowing
placement of a 10 French all-purpose drainage catheter. Appropriate
positioning was confirmed with a limited postprocedural CT scan.

Approximately 80 cc of dark red fluid fluid was aspirated. The tube
was connected to a JP bulb and sutured in place. A dressing was
placed. The patient tolerated the procedure well without immediate
post procedural complication.
IMPRESSION: Successful CT guided placement of a 10 French all purpose drain
catheter into the fluid collection within the midline of the lower
abdomen/pelvis with aspiration of 80 cc of dark red fluid. Samples
were sent to the laboratory as requested by the ordering clinical
team.

## 2018-04-07 IMAGING — CR DG CHEST 2V
2 series · 2 of 2 positions shown · non-contrast
Comparison: April 01, 2017

CLINICAL DATA: Fever

EXAM:
CHEST  2 VIEW

[w chest lat]
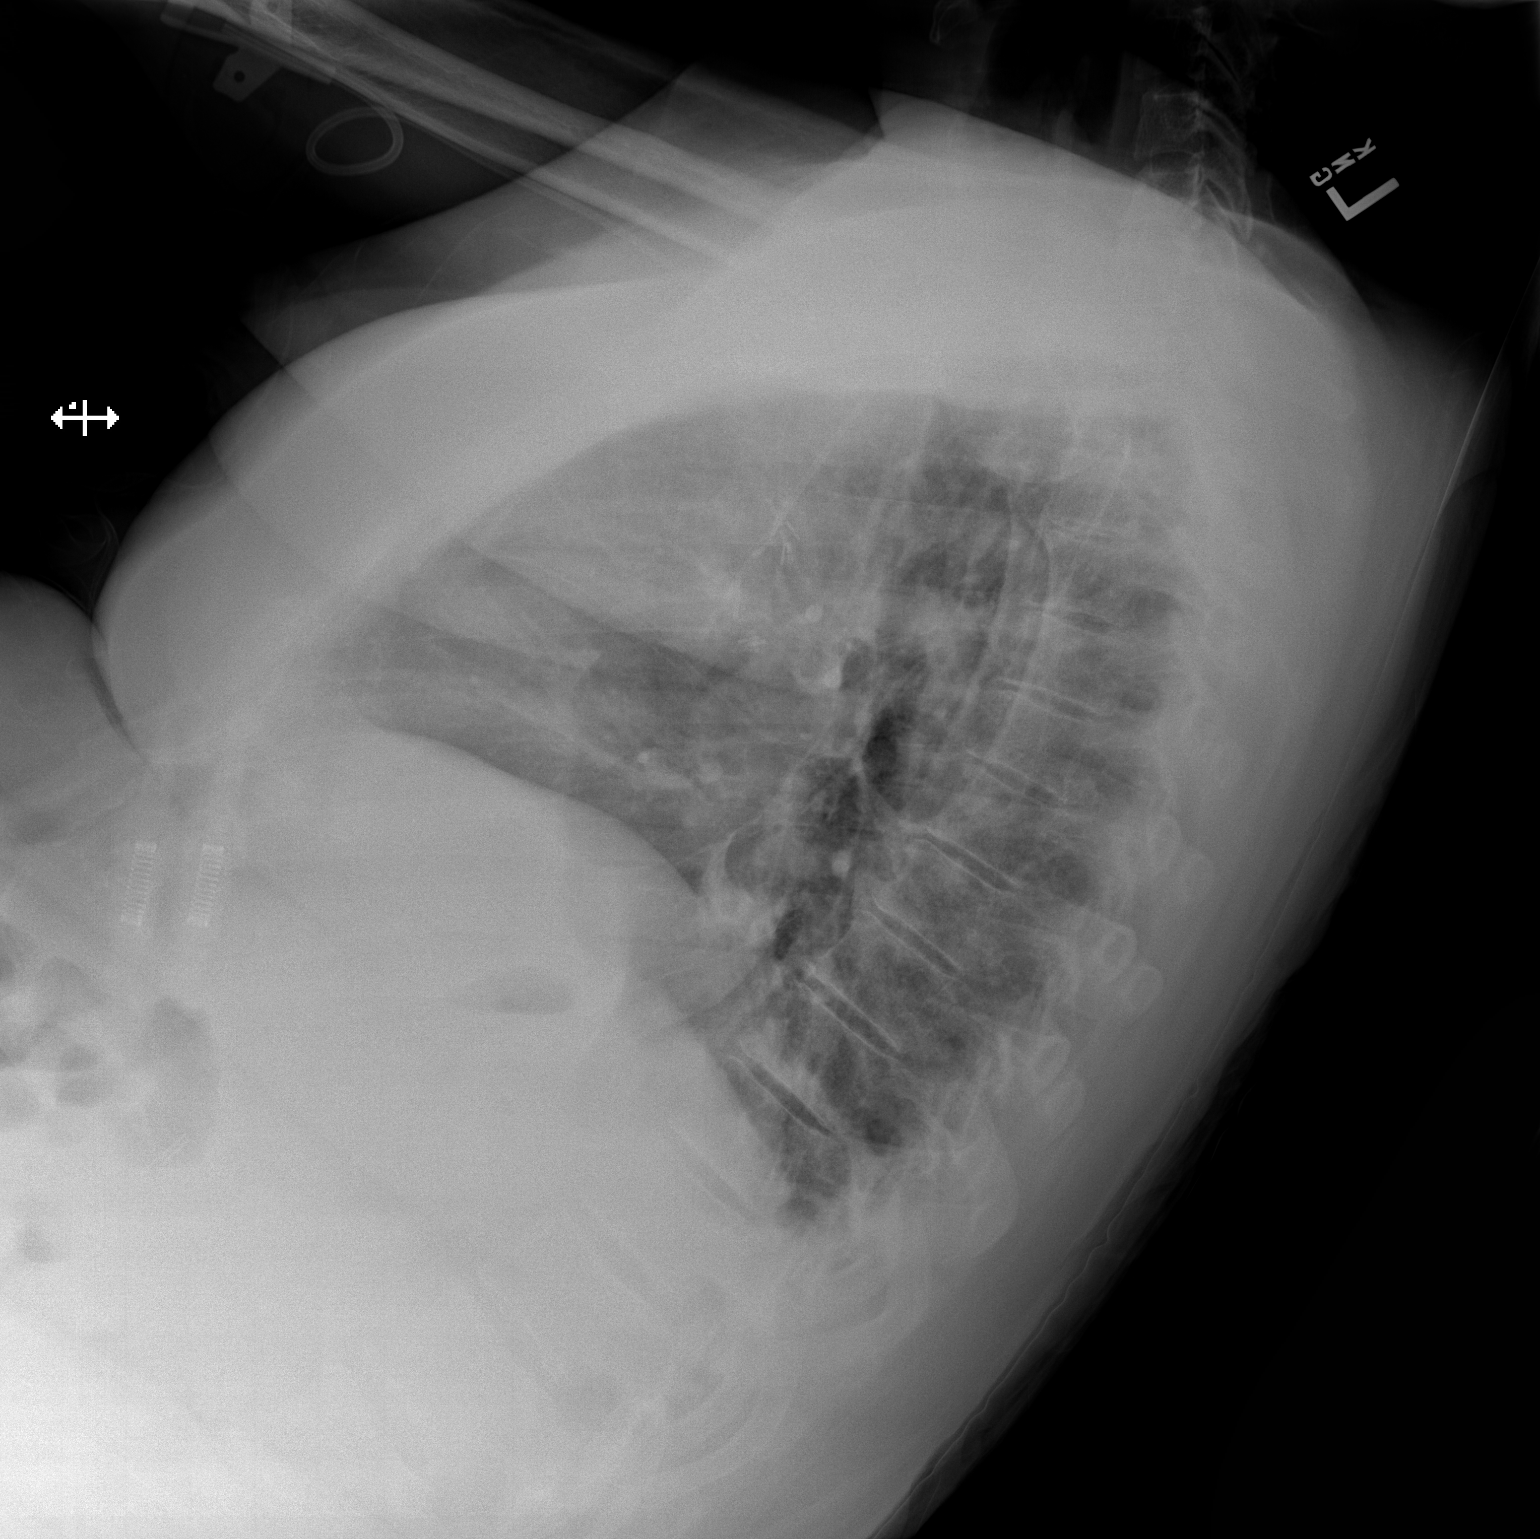

[x chest ap]
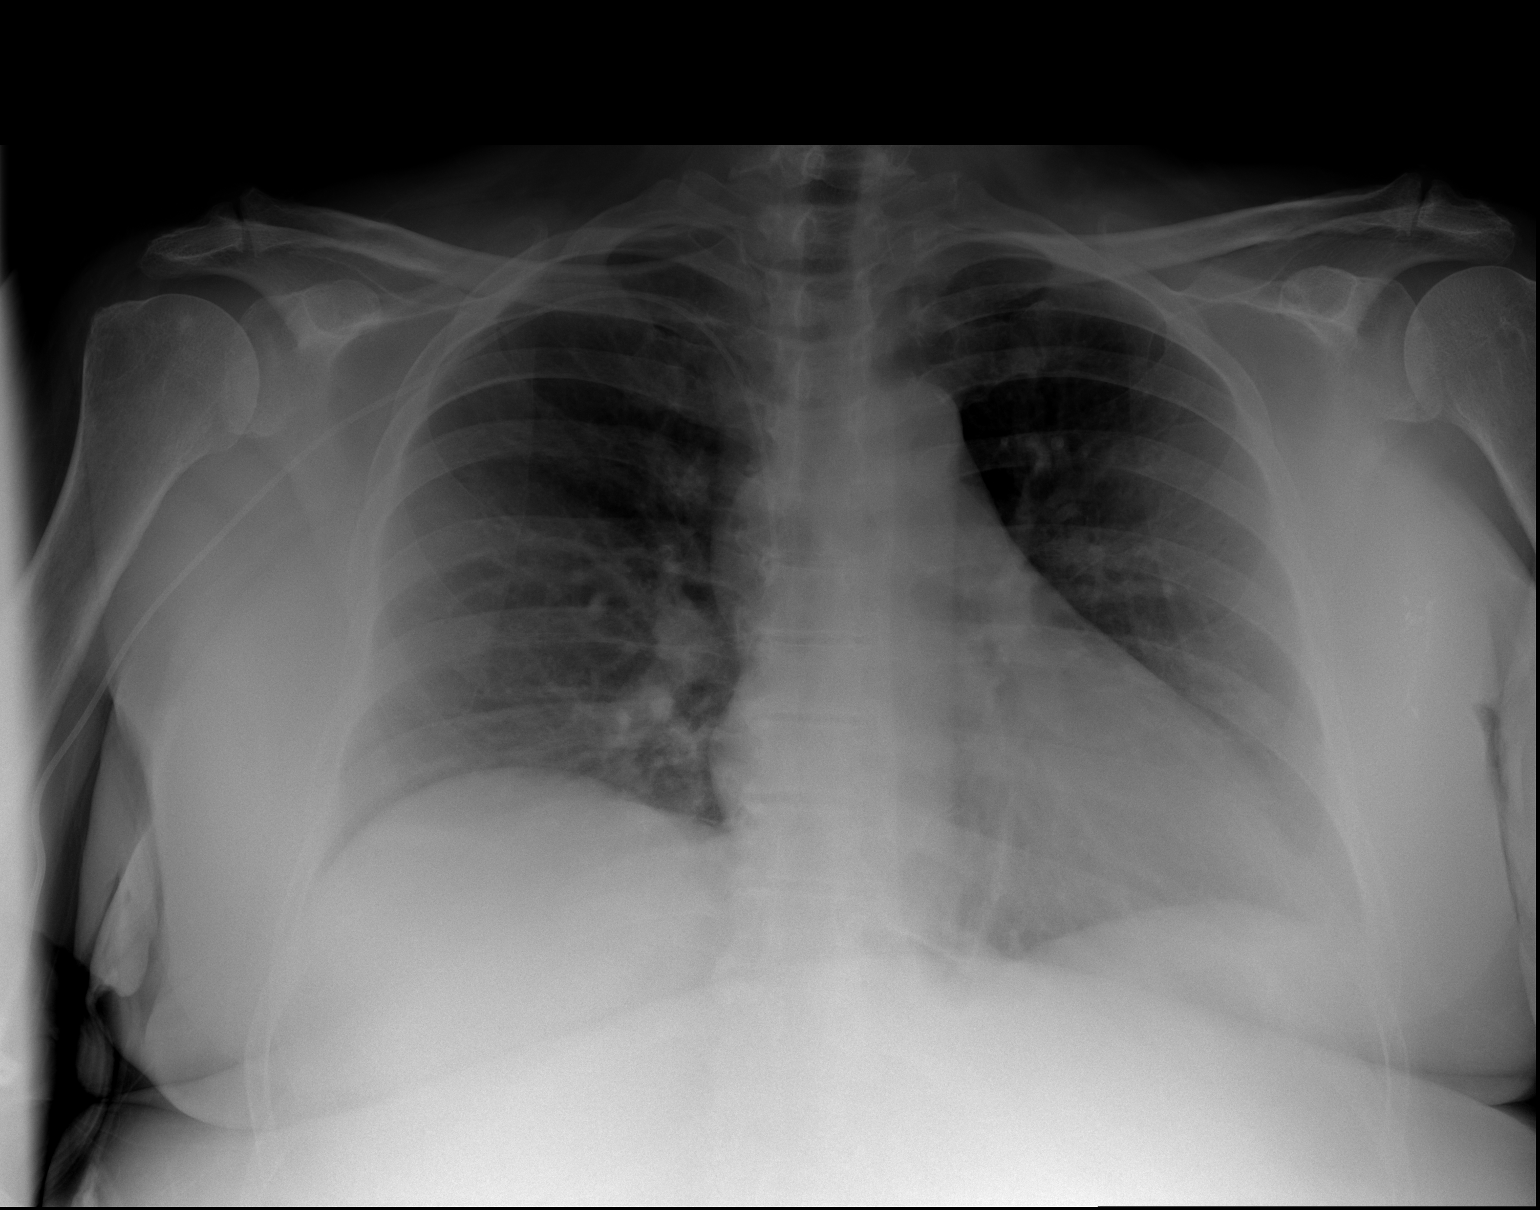

[2 of 2 positions shown; findings below may reference images not displayed]

FINDINGS: Central catheter tip is in the superior vena cava. No pneumothorax.
There is no edema or consolidation. The heart size and pulmonary
vascularity are normal. No adenopathy. No bone lesions.
IMPRESSION: No edema or consolidation. Central catheter tip in superior vena
cava. No pneumothorax.

## 2018-04-07 IMAGING — DX DG ABDOMEN 1V
1 series · 1 of 1 positions shown · non-contrast
Comparison: CT earlier this day.

CLINICAL DATA: NGT placement.

EXAM:
ABDOMEN - 1 VIEW

[abdomen kub]
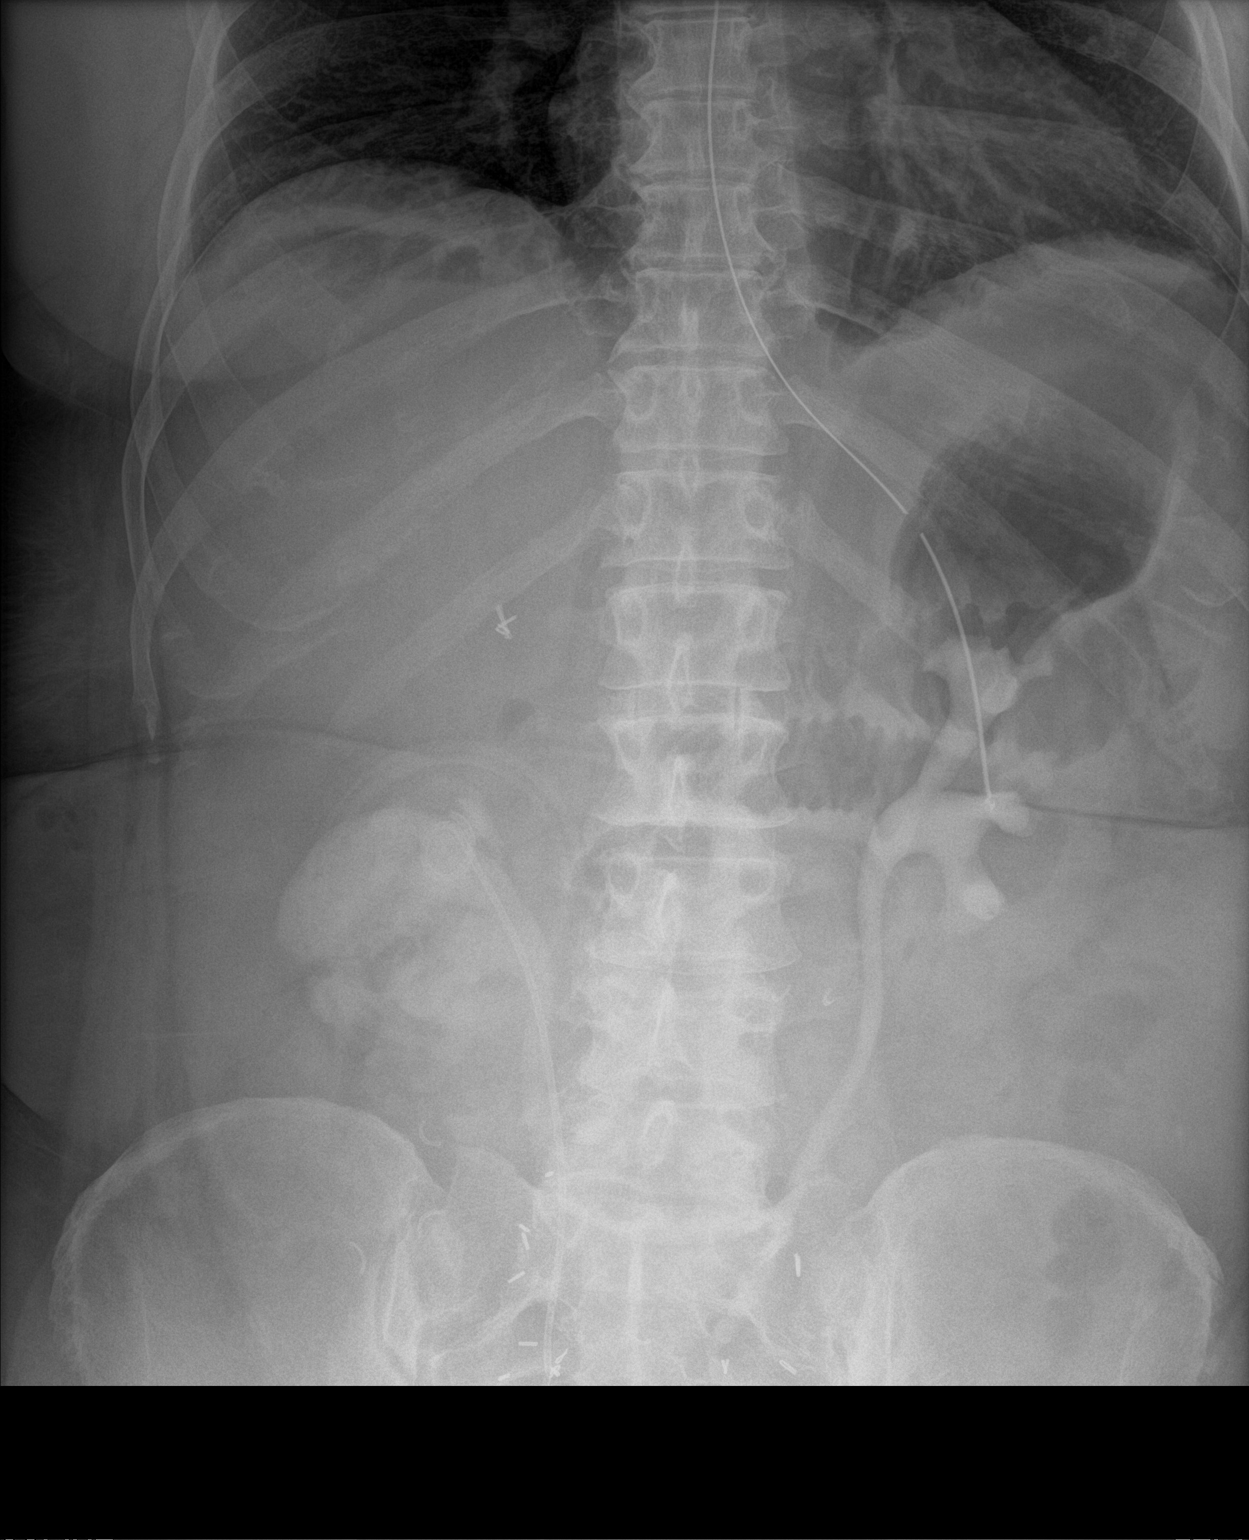

[1 of 1 positions shown; findings below may reference images not displayed]

FINDINGS: Tip and side port of the enteric tube below the diaphragm in the
stomach. Prominent small bowel in the left upper quadrant again
seen. Right mid abdominal ostomy. Excreted intravenous contrast in
the left renal collecting system and ureter which appear dilated.
Right ureteral stent in place. Surgical clips in the pelvis.
IMPRESSION: Tip and side port of the enteric tube below the diaphragm in the
stomach.

## 2018-04-08 IMAGING — DX DG CHEST 2V
2 series · 2 of 2 positions shown · non-contrast
Comparison: 04/16/2017

CLINICAL DATA: Fever

EXAM:
CHEST  2 VIEW

[chest lat]
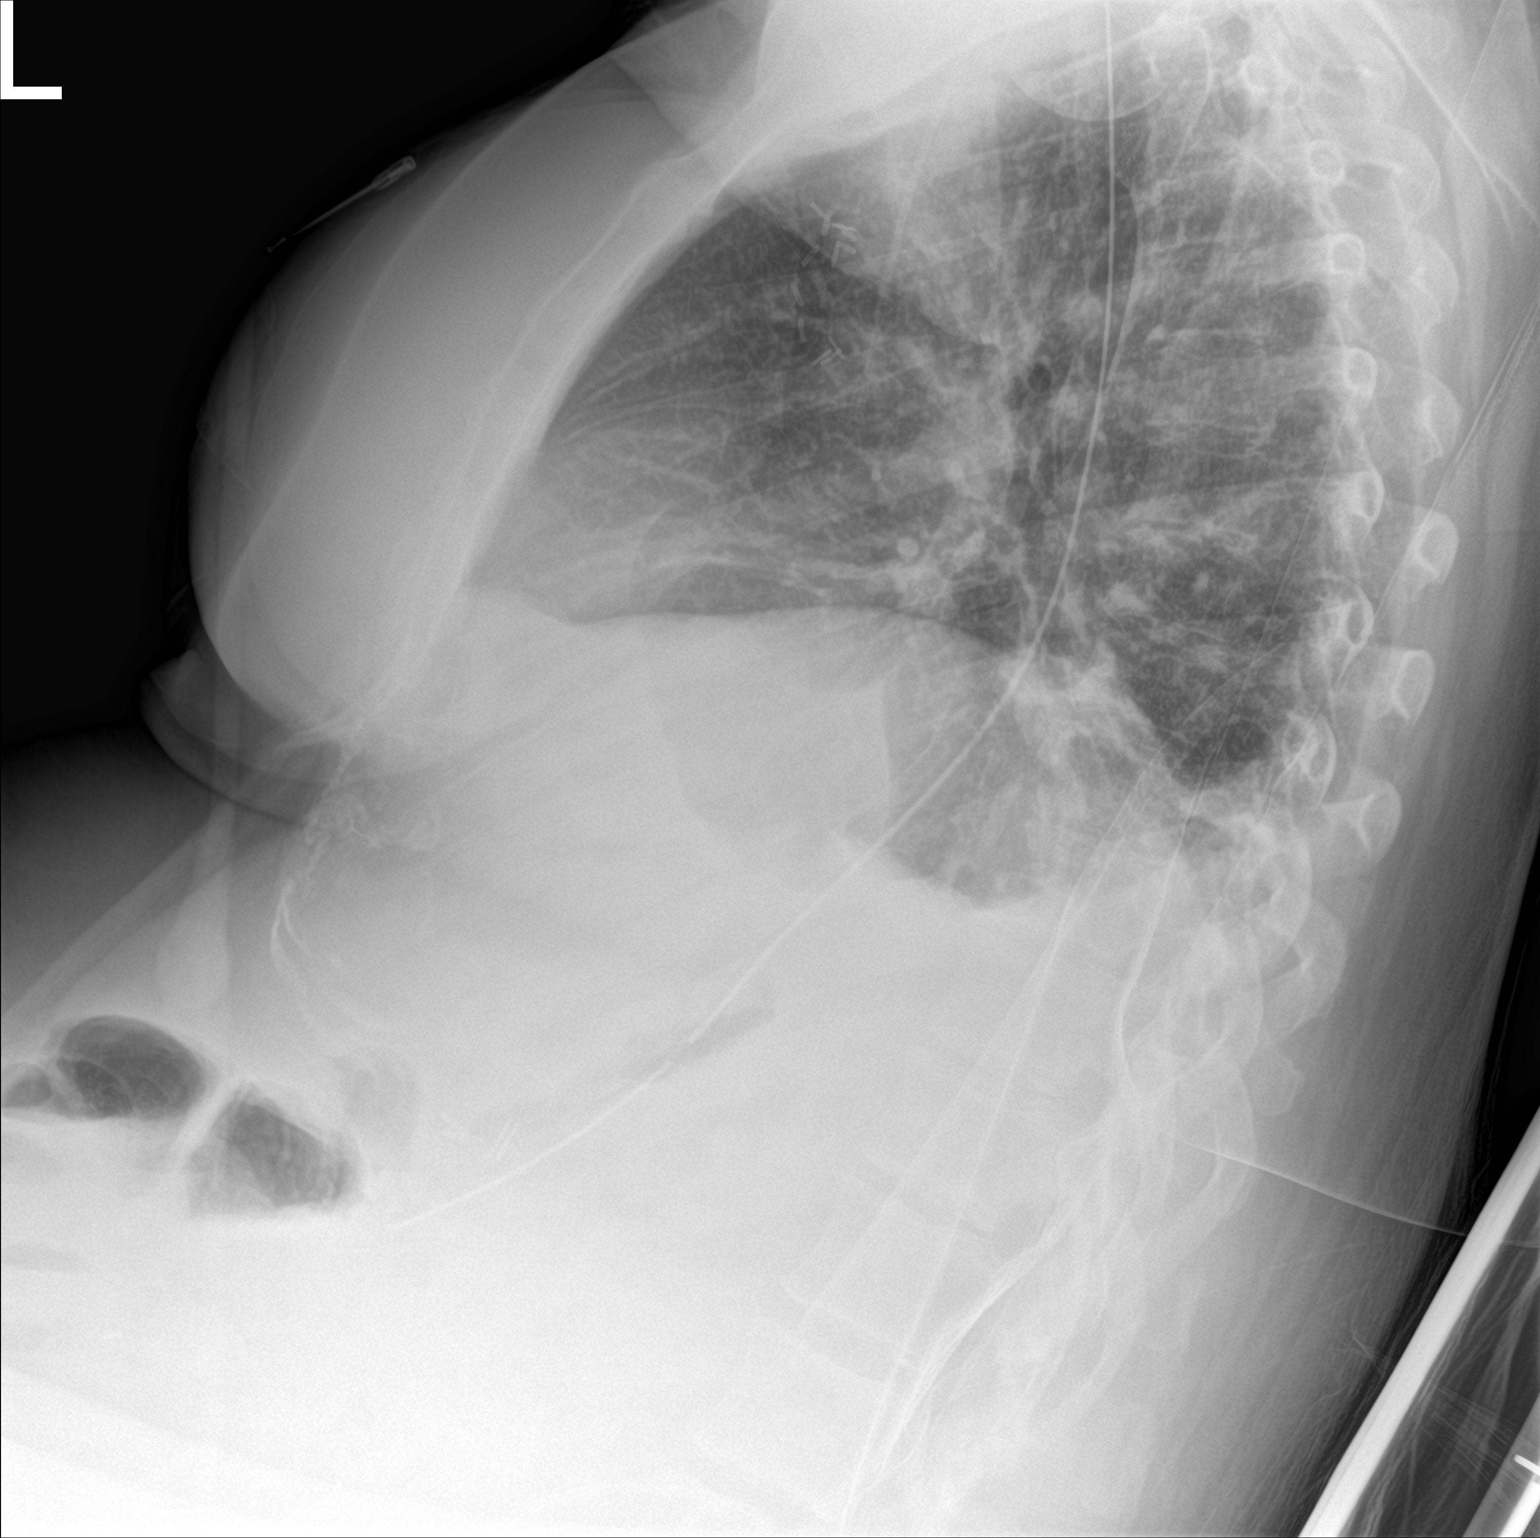

[chest ap]
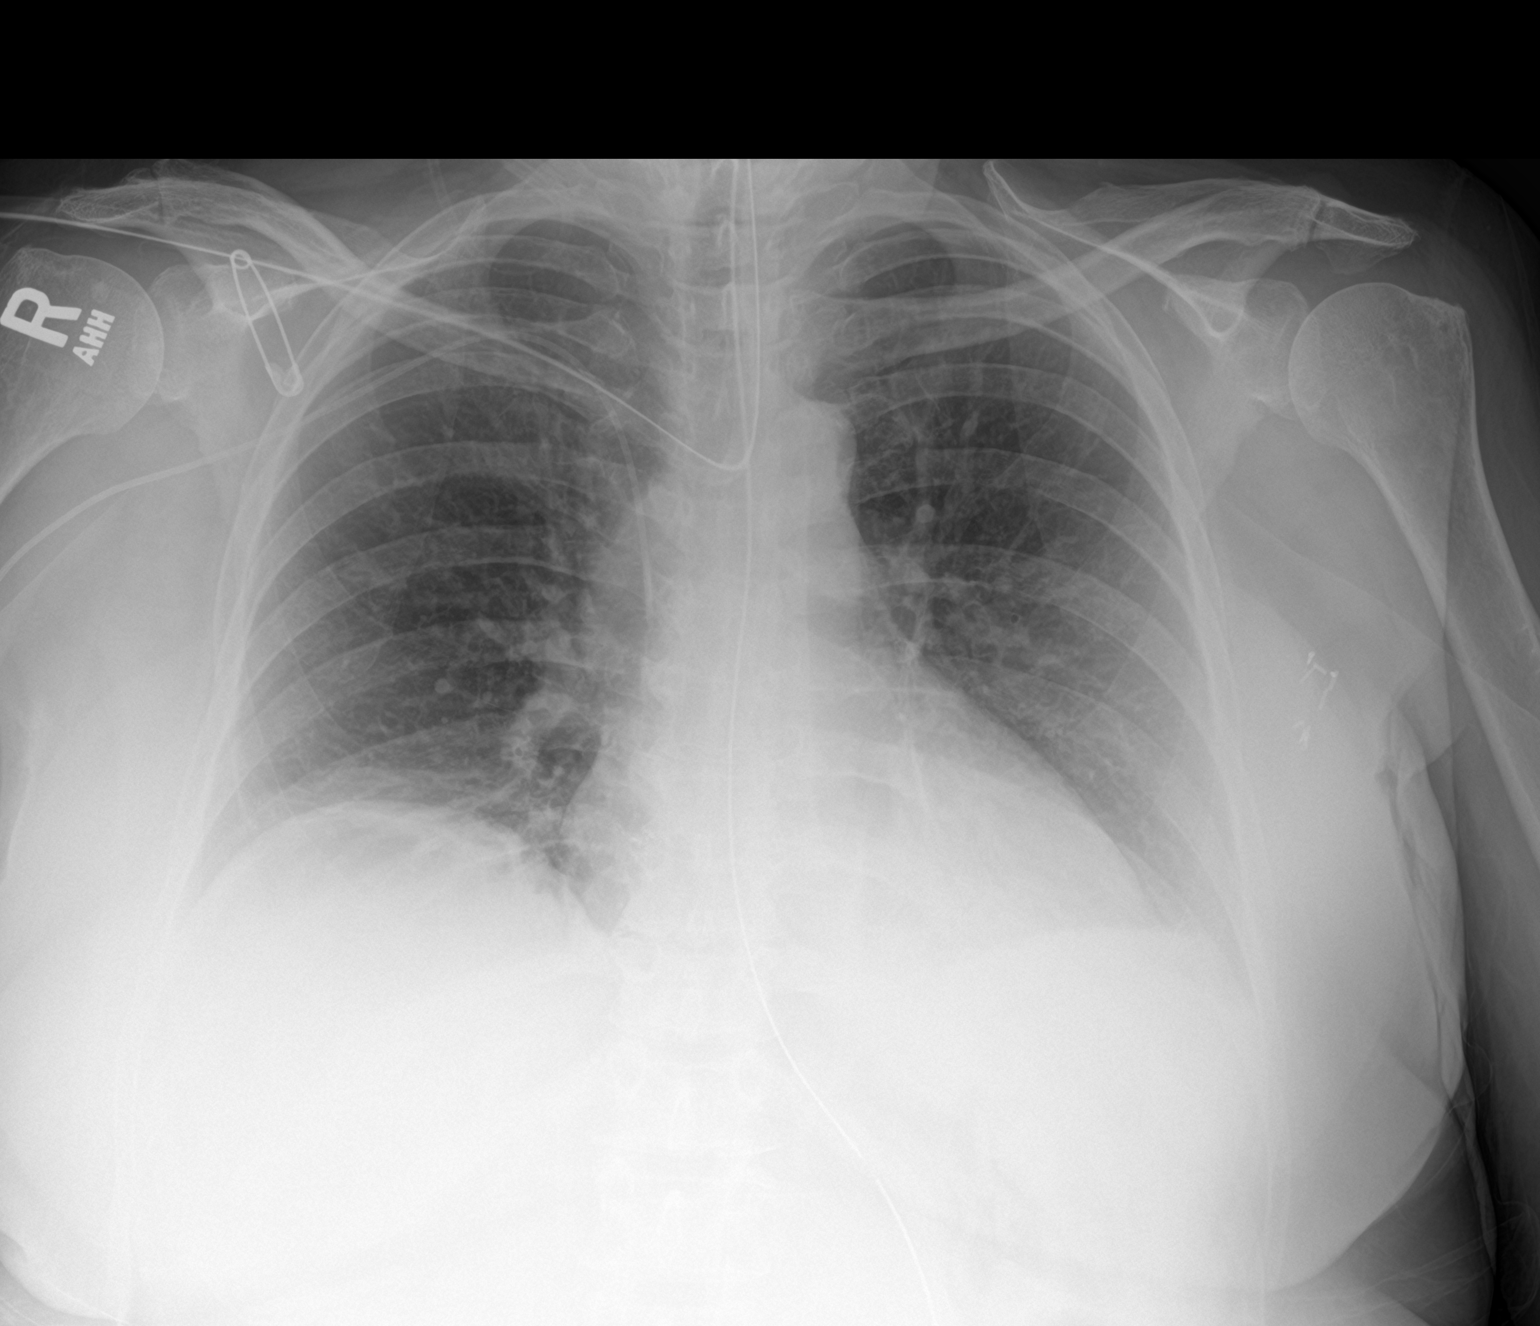

[2 of 2 positions shown; findings below may reference images not displayed]

FINDINGS: Right arm PICC is unchanged with its tip in the SVC. Nasogastric
tube enters the abdomen. There has been development of atelectasis
in both lower lobes. Coexistent pneumonia not excluded. Upper lungs
remain clear.
IMPRESSION: Development of atelectasis and or pneumonia in the lower lobes.

## 2018-04-10 DIAGNOSIS — L89319 Pressure ulcer of right buttock, unspecified stage: Secondary | ICD-10-CM | POA: Diagnosis not present

## 2018-04-10 DIAGNOSIS — Z933 Colostomy status: Secondary | ICD-10-CM | POA: Diagnosis not present

## 2018-04-12 DIAGNOSIS — T82594D Other mechanical complication of infusion catheter, subsequent encounter: Secondary | ICD-10-CM | POA: Diagnosis not present

## 2018-04-12 DIAGNOSIS — N182 Chronic kidney disease, stage 2 (mild): Secondary | ICD-10-CM | POA: Diagnosis not present

## 2018-04-13 DIAGNOSIS — D709 Neutropenia, unspecified: Secondary | ICD-10-CM | POA: Diagnosis not present

## 2018-04-13 DIAGNOSIS — D631 Anemia in chronic kidney disease: Secondary | ICD-10-CM | POA: Diagnosis not present

## 2018-04-13 DIAGNOSIS — I10 Essential (primary) hypertension: Secondary | ICD-10-CM | POA: Diagnosis not present

## 2018-04-13 DIAGNOSIS — N183 Chronic kidney disease, stage 3 (moderate): Secondary | ICD-10-CM | POA: Diagnosis not present
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
## 2018-04-14 DIAGNOSIS — E039 Hypothyroidism, unspecified: Secondary | ICD-10-CM | POA: Diagnosis not present

## 2018-04-14 DIAGNOSIS — E119 Type 2 diabetes mellitus without complications: Secondary | ICD-10-CM | POA: Diagnosis not present

## 2018-04-14 DIAGNOSIS — E782 Mixed hyperlipidemia: Secondary | ICD-10-CM | POA: Diagnosis not present

## 2018-04-14 DIAGNOSIS — E559 Vitamin D deficiency, unspecified: Secondary | ICD-10-CM | POA: Diagnosis not present

## 2018-04-16 ENCOUNTER — Other Ambulatory Visit: Payer: Self-pay | Admitting: Family Medicine

## 2018-04-16 DIAGNOSIS — R52 Pain, unspecified: Secondary | ICD-10-CM

## 2018-04-18 DIAGNOSIS — N183 Chronic kidney disease, stage 3 (moderate): Secondary | ICD-10-CM | POA: Diagnosis not present

## 2018-04-18 DIAGNOSIS — E119 Type 2 diabetes mellitus without complications: Secondary | ICD-10-CM | POA: Diagnosis not present

## 2018-04-18 DIAGNOSIS — I129 Hypertensive chronic kidney disease with stage 1 through stage 4 chronic kidney disease, or unspecified chronic kidney disease: Secondary | ICD-10-CM | POA: Diagnosis not present

## 2018-04-18 DIAGNOSIS — E782 Mixed hyperlipidemia: Secondary | ICD-10-CM | POA: Diagnosis not present

## 2018-04-18 DIAGNOSIS — Z23 Encounter for immunization: Secondary | ICD-10-CM | POA: Diagnosis not present

## 2018-04-19 ENCOUNTER — Other Ambulatory Visit: Payer: Self-pay | Admitting: Family Medicine

## 2018-04-19 DIAGNOSIS — C55 Malignant neoplasm of uterus, part unspecified: Secondary | ICD-10-CM

## 2018-04-19 DIAGNOSIS — E785 Hyperlipidemia, unspecified: Secondary | ICD-10-CM | POA: Diagnosis not present

## 2018-04-19 DIAGNOSIS — T82594D Other mechanical complication of infusion catheter, subsequent encounter: Secondary | ICD-10-CM | POA: Diagnosis not present

## 2018-04-19 DIAGNOSIS — E860 Dehydration: Secondary | ICD-10-CM | POA: Diagnosis not present

## 2018-04-19 DIAGNOSIS — N182 Chronic kidney disease, stage 2 (mild): Secondary | ICD-10-CM | POA: Diagnosis not present

## 2018-04-19 DIAGNOSIS — L89309 Pressure ulcer of unspecified buttock, unspecified stage: Secondary | ICD-10-CM | POA: Diagnosis not present

## 2018-04-20 ENCOUNTER — Ambulatory Visit (INDEPENDENT_AMBULATORY_CARE_PROVIDER_SITE_OTHER): Payer: BLUE CROSS/BLUE SHIELD | Admitting: Family Medicine

## 2018-04-20 ENCOUNTER — Ambulatory Visit (INDEPENDENT_AMBULATORY_CARE_PROVIDER_SITE_OTHER): Payer: BLUE CROSS/BLUE SHIELD | Admitting: Psychology

## 2018-04-20 VITALS — BP 119/68 | HR 81 | Temp 98.1°F | Ht 62.0 in | Wt 201.0 lb

## 2018-04-20 DIAGNOSIS — E559 Vitamin D deficiency, unspecified: Secondary | ICD-10-CM | POA: Diagnosis not present

## 2018-04-20 DIAGNOSIS — E1129 Type 2 diabetes mellitus with other diabetic kidney complication: Secondary | ICD-10-CM

## 2018-04-20 DIAGNOSIS — L89319 Pressure ulcer of right buttock, unspecified stage: Secondary | ICD-10-CM | POA: Diagnosis not present

## 2018-04-20 DIAGNOSIS — Z6836 Body mass index (BMI) 36.0-36.9, adult: Secondary | ICD-10-CM | POA: Diagnosis not present

## 2018-04-20 DIAGNOSIS — E785 Hyperlipidemia, unspecified: Secondary | ICD-10-CM | POA: Diagnosis not present

## 2018-04-20 DIAGNOSIS — F3289 Other specified depressive episodes: Secondary | ICD-10-CM | POA: Diagnosis not present

## 2018-04-20 DIAGNOSIS — Z933 Colostomy status: Secondary | ICD-10-CM | POA: Diagnosis not present

## 2018-04-20 DIAGNOSIS — Z794 Long term (current) use of insulin: Secondary | ICD-10-CM | POA: Diagnosis not present

## 2018-04-20 DIAGNOSIS — E860 Dehydration: Secondary | ICD-10-CM | POA: Diagnosis not present

## 2018-04-20 DIAGNOSIS — L89309 Pressure ulcer of unspecified buttock, unspecified stage: Secondary | ICD-10-CM | POA: Diagnosis not present

## 2018-04-20 MED ORDER — FENTANYL 50 MCG/HR TD PT72: 50.0000 ug | MEDICATED_PATCH | TRANSDERMAL | 0 refills | Status: DC

## 2018-04-20 NOTE — Telephone Encounter (Signed)
Requesting: Fentanyl 60mcg patch every 3 days Contract: none found UDS: 02/17/18 Last OV: 02/17/18 Next Ov: 05/25/18 Last refill: 03/27/18, #10, 0RF Database: printed, left on Dr. Nonda Lou workspace to review.  Order pended,please advise.

## 2018-04-24 DIAGNOSIS — L89309 Pressure ulcer of unspecified buttock, unspecified stage: Secondary | ICD-10-CM | POA: Diagnosis not present

## 2018-04-24 IMAGING — RF DG SINUS / FISTULA TRACT / ABSCESSOGRAM
4 series · 10 of 10 positions shown · non-contrast
Comparison: none

INDICATION: 61-year-old with a postoperative urinoma and status post
percutaneous drainage catheter placement. Markedly increased output
from the drainage catheter after the Foley catheter was removed and
concerning for a urinary leak.

[Series 1: one shot · 1 of 1 slices shown (1 of 2)]
[im 1/1]
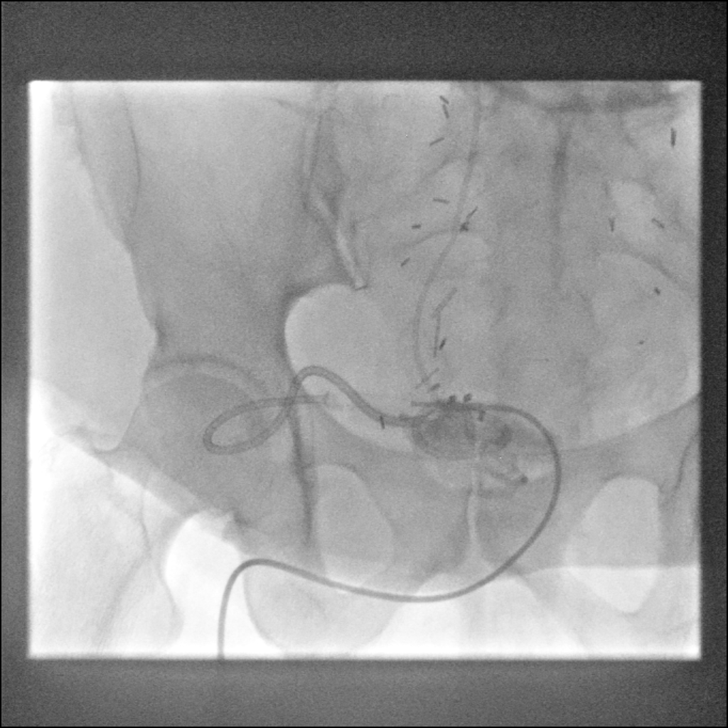

[Series 2: sequence · 4 of 66 frames shown (1 of 2)]
[frame 10/66]
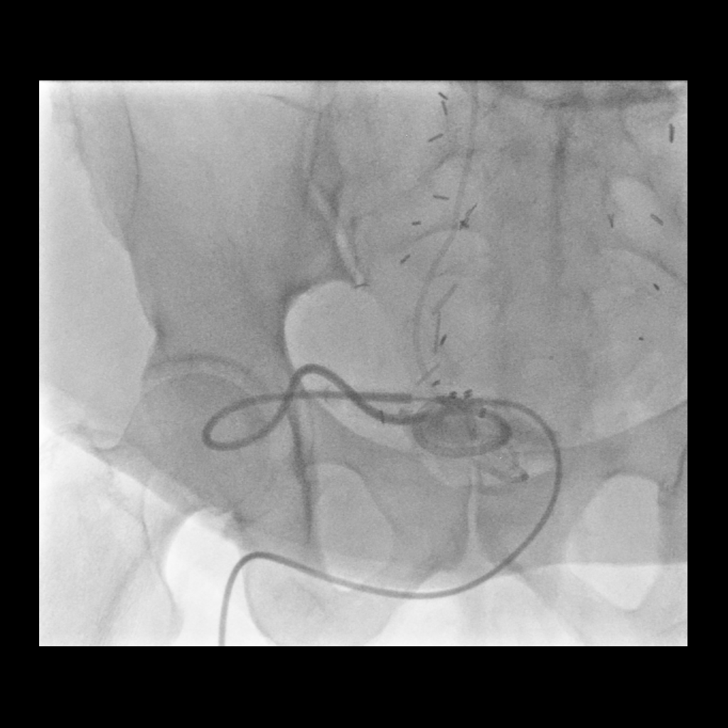
[frame 34/66]
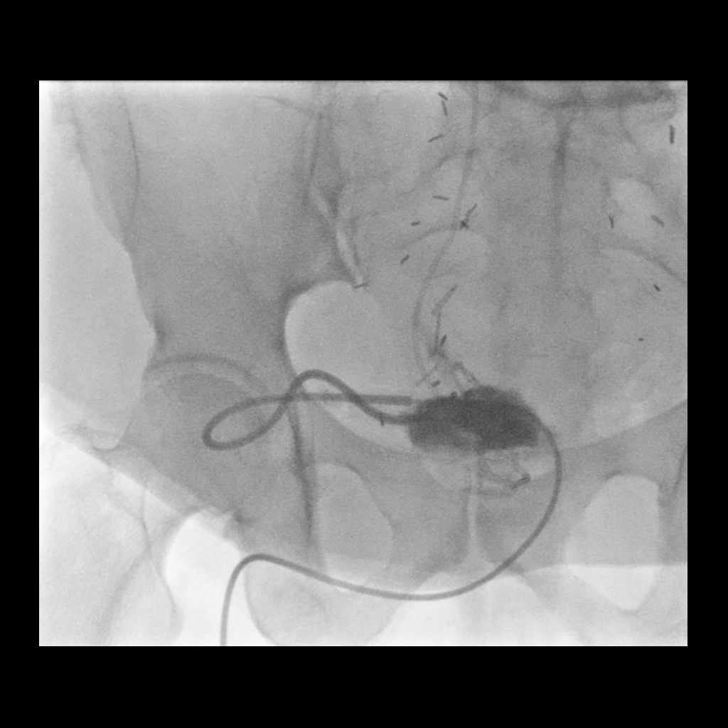
[frame 41/66]
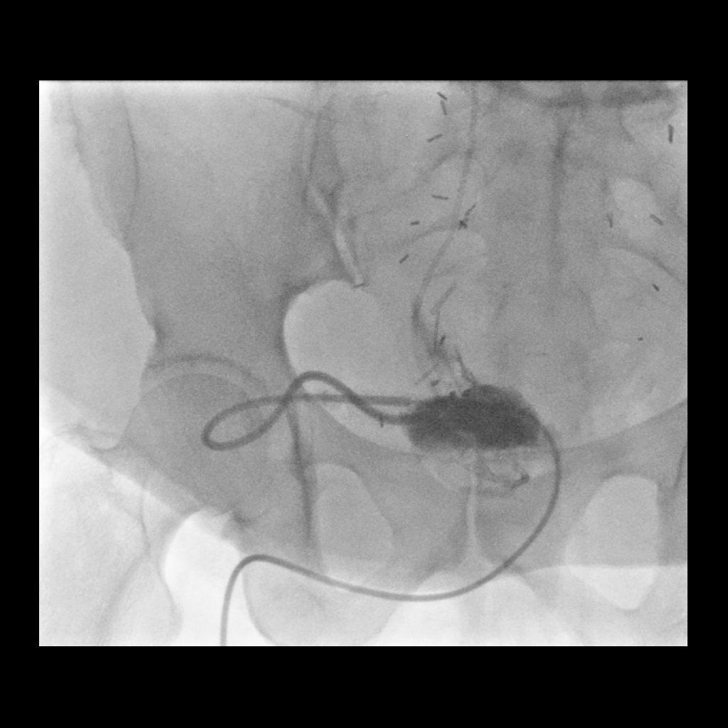
[frame 57/66]
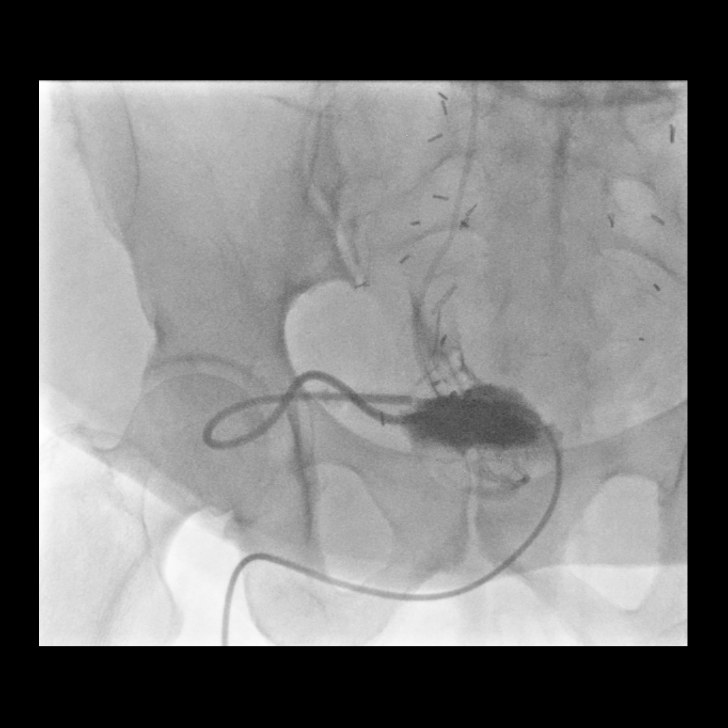

[Series 3: one shot · 1 of 1 slices shown (2 of 2)]
[im 1/1]
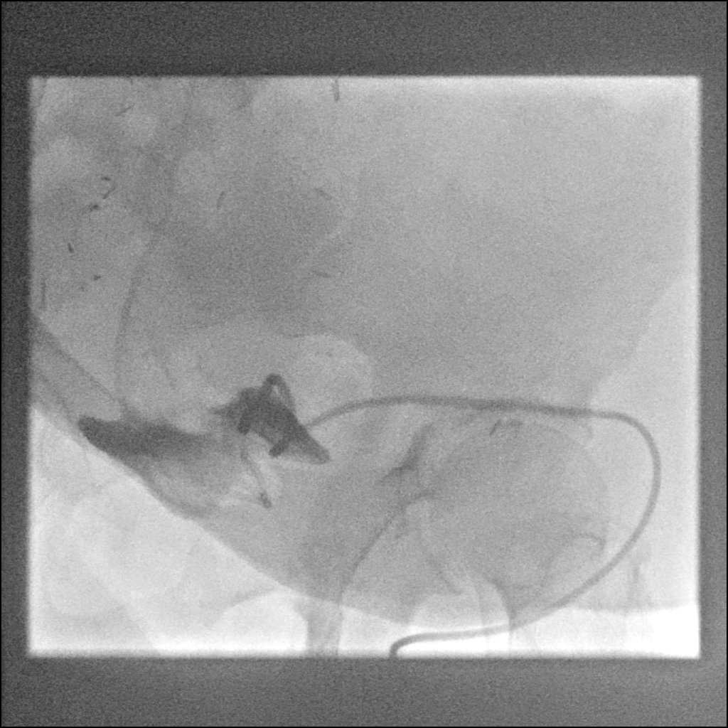

[Series 4: sequence · 4 of 17 frames shown (2 of 2)]
[frame 3/17]
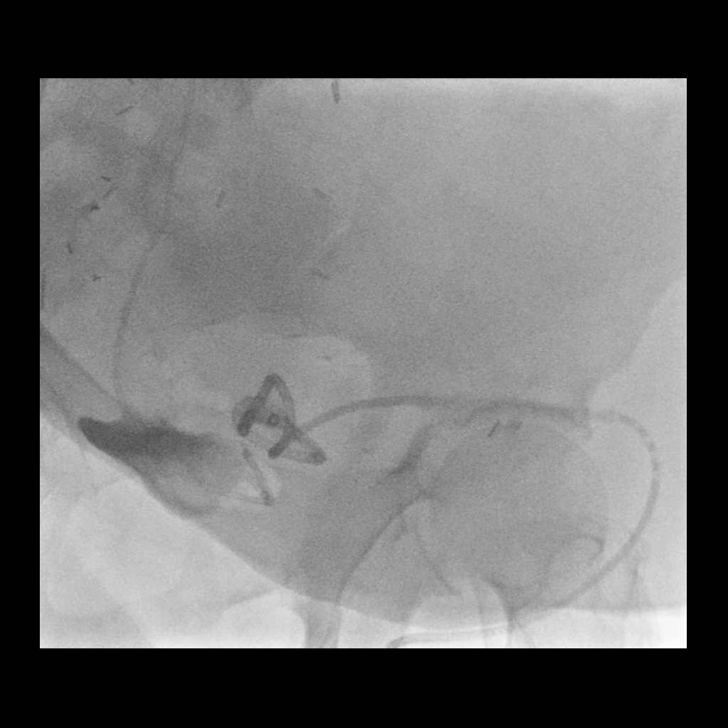
[frame 9/17]
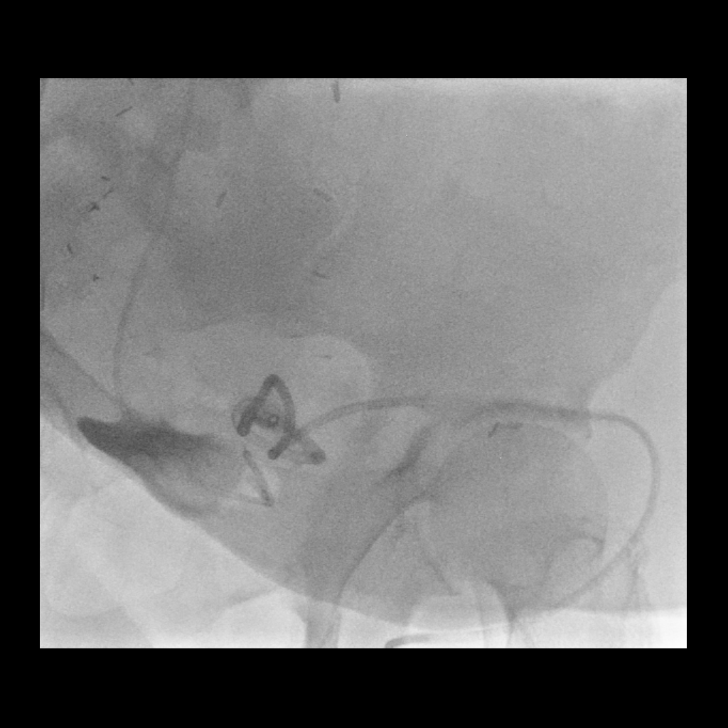
[frame 15/17]
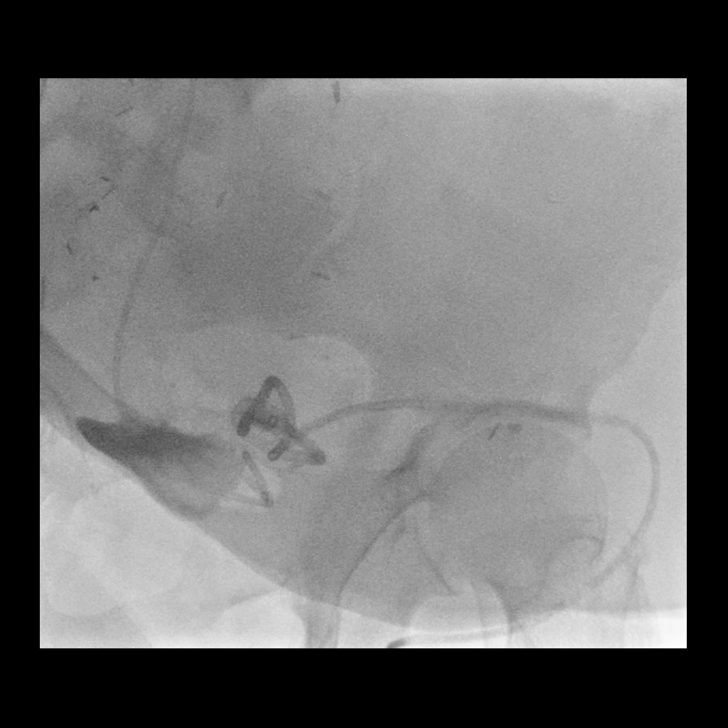
[frame 16/17]
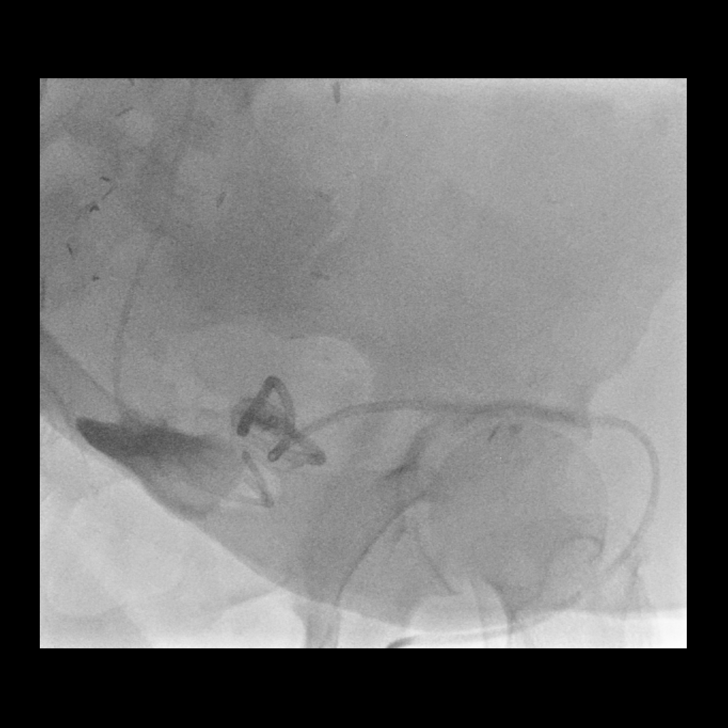

[10 of 10 positions shown; findings below may reference images not displayed]

EXAM:
DRAIN INJECTION WITH FLUOROSCOPY

MEDICATIONS:
None

ANESTHESIA/SEDATION:
None

FLUOROSCOPY TIME:  48 seconds, 90 mGy

COMPLICATIONS:
None immediate.

PROCEDURE:
Contrast was injected through the percutaneous drainage catheter.
Contrast was aspirated at the end of the procedure. The catheter was
flushed with normal saline and attached to gravity bag.
FINDINGS: Drainage catheter is just anterior to the urinary bladder. There is
filling of a small cavity just anterior to the bladder but there is
also filling of the urinary bladder. Findings compatible with a
bladder leak.
IMPRESSION: There is a fistula connection between the drainage catheter and the
urinary bladder. Exact location of the urinary leak is not clear.

## 2018-04-24 NOTE — Progress Notes (Signed)
Office: [phone]  /  Fax: [phone]   HPI:   Chief Complaint: OBESITY [name] is here to discuss her progress with her obesity treatment plan. She is on the  keep a food journal with 1200-1400 calories and 60-80 protein  and is following her eating plan approximately 0 % of the time. She states she is exercising 0 minutes 0 times per week. [name] did well journaling and watching her calories and protein. She felt it was a lot of food and hunger was appropriate and easily controlled. She ate a lot of fruit but still met all her goals mostly. Her weight is 201 lb (91.2 kg) today and has had a weight loss of 7 pounds over a period of 2 weeks since her last visit. She has lost 7 lbs since starting treatment with [place].  Diabetes II [name] has a diagnosis of diabetes type II. [name] states BGs range between 65-143, 90-110. She is currently on Lantus 10 u BID and denies any hypoglycemic episodes. Patient  Last A1c was Hemoglobin A1C Latest Ref Rng & Units 09/01/2017  HGBA1C 4.8 - 5.6 % 7.9(H)  Some recent data might be hidden    She has been working on intensive lifestyle modifications including diet, exercise, and weight loss to help control her blood glucose levels.  Vitamin D deficiency [name] has a diagnosis of vitamin D deficiency. She is currently taking vit D and denies nausea, vomiting or muscle weakness. Patient is almost at goal on 10 k weekly and notes fatigue has improved.   ALLERGIES: Allergies  Allergen Reactions  . Penicillins Swelling    Facial swelling/childhood allergy Has patient had a PCN reaction causing immediate rash, facial/tongue/throat swelling, SOB or lightheadedness with hypotension: Yes Has patient had a PCN reaction causing severe rash involving mucus membranes or skin necrosis: Yes Has patient had a PCN reaction that required hospitalization yes Has patient had a PCN reaction occurring within the last 10 years: No If all of the above answers are "NO", then may  proceed with Cephalosporin use.   . Cefaclor Rash    Ceclor  . Erythromycin Other (See Comments)    Gastritis, abd cramps  . Tape Rash    blisters  . Trimethoprim Rash  . Ultram [Tramadol] Hives  . Cephalosporins Rash  . Fluconazole Rash  . Oxycodone Other (See Comments)    " I just feel weird"  . Pectin Rash    Pectin ring for stoma  . Septra [Sulfamethoxazole-Trimethoprim] Rash  . Sulfa Antibiotics Rash    MEDICATIONS: Current Outpatient Medications on File Prior to Visit  Medication Sig Dispense Refill  . acetaminophen (TYLENOL) 325 MG tablet Take 2 tablets (650 mg total) by mouth every 6 (six) hours as needed. 30 tablet 1  . anastrozole (ARIMIDEX) 1 MG tablet TAKE 1 TABLET DAILY 90 tablet 3  . Biotin 5 MG TABS Take 5 mg by mouth every morning.     . Cholecalciferol (VITAMIN D3) 10000 UNITS capsule Take 10,000 Units by mouth once a week. Sunday evening's    . clobetasol (OLUX) 0.05 % topical foam Apply topically 2 (two) times daily.    . collagenase (SANTYL) ointment Apply 1 application topically daily as needed (skin care).     . diphenhydrAMINE (BENADRYL) 25 mg capsule Take 1 capsule (25 mg total) by mouth every 8 (eight) hours as needed for itching, allergies or sleep. 30 capsule 0  . diphenoxylate-atropine (LOMOTIL) 2.5-0.025 MG tablet Take 1 tablet by mouth 4 (four) times  daily as needed for diarrhea or loose stools. TO PREVENT LOOSE BOWEL MOVEMENTS (Patient taking differently: Take 1 tablet by mouth every other day. TO PREVENT LOOSE BOWEL MOVEMENTS) 30 tablet 0  . fentaNYL (DURAGESIC) 50 MCG/HR Place 1 patch (50 mcg total) onto the skin every 3 (three) days. 10 patch 0  . ferrous sulfate 325 (65 FE) MG tablet Take 1 tablet (325 mg total) by mouth at bedtime. 30 tablet 3  . HYDROcodone-acetaminophen (NORCO/VICODIN) 5-325 MG tablet Take 1 tablet by mouth every 6 (six) hours as needed for moderate pain. 30 tablet 0  . insulin glargine (LANTUS) 100 UNIT/ML injection Inject 8  Units into the skin 2 (two) times daily.     [name] Kitchen JANUVIA 50 MG tablet     . levothyroxine (SYNTHROID, LEVOTHROID) 150 MCG tablet Take 1 tablet (150 mcg total) by mouth daily before breakfast. 30 tablet 1  . loperamide (IMODIUM) 2 MG capsule Take 1-2 capsules (2-4 mg total) by mouth every 8 (eight) hours as needed for diarrhea or loose stools (Use if >2 BM every 8 hours). 30 capsule 0  . loratadine (CLARITIN) 10 MG tablet Take 10 mg by mouth every morning.     [name] Kitchen LYRICA 50 MG capsule TAKE 1 CAPSULE(50 MG) BY MOUTH TWICE DAILY 180 capsule 0  . nitrofurantoin, macrocrystal-monohydrate, (MACROBID) 100 MG capsule Take 100 mg by mouth at bedtime.    [name] Kitchen omega-3 acid ethyl esters (LOVAZA) 1 G capsule Take 1 g by mouth 2 (two) times daily.     [name] Faster Glycol-Propyl Glycol (SYSTANE OP) Place 1 drop into both eyes daily as needed (dry eyes).     . Prenatal Vit-Fe Fumarate-FA (PRENATAL VITAMIN PO) Take 1 capsule by mouth daily. Takes prenatal because there are no dyes in it    . protein supplement shake (PREMIER PROTEIN) LIQD Take 325 mLs (11 oz total) by mouth 2 (two) times daily as needed (Pt to request). 60 Can 1  . ranitidine (ZANTAC) 150 MG tablet Take 1 tablet (150 mg total) by mouth 2 (two) times daily as needed for heartburn. (Patient taking differently: Take 150 mg by mouth at bedtime. ) 60 tablet 1  . rosuvastatin (CRESTOR) 10 MG tablet Take 10 mg by mouth every evening.     . Saccharomyces boulardii (FLORASTOR PO) Take 1 capsule by mouth daily.    . sodium bicarbonate 650 MG tablet Take 650 mg by mouth 2 (two) times daily.    . sodium chloride 0.9 % injection Inject 10 mLs into the vein daily.    . Tbo-Filgrastim (GRANIX) 480 MCG/0.8ML SOSY injection Inject 0.8 mLs (480 mcg total) into the skin daily. 30 Syringe 11   No current facility-administered medications on file prior to visit.     PAST MEDICAL HISTORY: Past Medical History:  Diagnosis Date  . Adrenal adenoma, left 02/08/2016   CT:  stable benign  . Anemia in neoplastic disease   . Back pain   . Benign essential HTN   . 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 Cancer of corpus uteri, except isthmus [facility])  oncologist-- dr Denman [name] and dr Alvy Bimler    10-15-2004  dx endometroid endometrial and ovarian cancer s/p  chemotheapy and surgery(TAH w/ BSO) :  recurrent 11-19-2014 post pelvic surgery and radiation 01-29-2015 to 03-10-2015  . Chronic  idiopathic neutropenia ([place])    presumed related to chemotherapy March 2006--- followed by dr Alvy Bimler (treatment w/ G-CSF injections  . Chronic nausea   . Chronic pain    perineal/ anal  area from bladder pad irritates skin , right flank pain  . CKD stage G2/A3, GFR 60-89 and albumin creatinine ratio >300 mg/g    nephrologist-  dr Madelon Lips  . Colovesical fistula   . Diabetic retinopathy, background ([place])   . Difficult intravenous access    small veins--- hx PICC lines  . DM type 2 (diabetes mellitus, type 2) ([place])    monitored by dr Legrand Como altheimer  . Dysuria   . Environmental and seasonal allergies   . Fatty liver 02/08/2016   CT  . Generalized muscle weakness   . GERD (gastroesophageal reflux disease)   . Hiatal hernia   . History of abdominal abscess 04/16/2017   post surgery 04-01-2017  --- resolved 10/ 2018  . History of gastric polyp    2014  duodenum  . History of ileus 04/16/2017   resolved w/ no surgical intervention  . History of radiation therapy    01-29-2015 to 03-10-2015  pelvis 50.4Gy  . Hypothyroidism    monitored by dr Legrand Como altheimer  . IBS (irritable bowel syndrome)   . Ileostomy in place [facility]) 04/01/2017   created at same time colostomy takedown.  . Joint pain   . Leg edema   . Lower urinary tract symptoms (LUTS)    urge urinary  incontinence  . Mixed  dyslipidemia   . Multiple thyroid nodules    Managed by Dr. Harlow Asa  . Nephrostomy status ([place])   . Palpitations   . Pelvic abscess in female 04/16/2017  . PONV (postoperative nausea and vomiting)    "scopolamine patch works for me"  . Radiation-induced dermatitis    contact dermatitis , radiation completed, rash only on ankles now.  . Seasonal allergies   . Ureteral stricture, right UROLOGIT-  DR [facility]   CHRONIC--  TREATMENT URETERAL STENT  . 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 Vitamin D deficiency   . Wears glasses     PAST SURGICAL HISTORY: Past Surgical History:  Procedure Laterality Date  . APPENDECTOMY    . biopsy thyroid nodules    . BREAST RECONSTRUCTION WITH PLACEMENT OF TISSUE EXPANDER AND FLEX HD (ACELLULAR HYDRATED DERMIS) Bilateral 12/11/2015   Procedure: BILATERAL BREAST RECONSTRUCTION WITH PLACEMENT OF TISSUE EXPANDERS;  Surgeon: Irene Limbo, MD;  Location: [place];  Service: Plastics;  Laterality: Bilateral;  . COLONOSCOPY WITH PROPOFOL N/A 08/21/2013   Procedure: COLONOSCOPY WITH PROPOFOL;  Surgeon: Cleotis Nipper, MD;  [facility];  Service: Endoscopy;  Laterality: N/A;  . COLOSTOMY TAKEDOWN N/A 12/04/2014   Procedure: LAPROSCOPIC LYSIS OF ADHESIONS, SPLENIC MOBILIZATION, RELOCATION OF COLOSTOMY, DEBRIDEMENT INITIAL COLOSTOMY SITE;  Surgeon: Michael [name], MD;  [facility];  Service: General;  Laterality: N/A;  . CYSTOGRAM N/A 06/01/2017   Procedure: CYSTOGRAM;  Surgeon: Alexis Frock, MD;  [facility];  Service: Urology;  Laterality: N/A;  . CYSTOSCOPY W/ RETROGRADES Right 11/21/2015   Procedure: CYSTOSCOPY WITH RETROGRADE PYELOGRAM;  Surgeon: Alexis Frock, MD;  [facility];  Service: Urology;  Laterality: Right;  . CYSTOSCOPY W/ URETERAL STENT PLACEMENT Right 11/21/2015   Procedure: CYSTOSCOPY WITH STENT REPLACEMENT;  Surgeon: Alexis Frock, MD;  [facility];  Service: Urology;  Laterality: Right;  . CYSTOSCOPY W/  URETERAL STENT PLACEMENT Right 03/10/2016   Procedure: CYSTOSCOPY WITH STENT  REPLACEMENT;  Surgeon: Alexis Frock, MD;  Location: [facility];  Service: Urology;  Laterality: Right;  . CYSTOSCOPY W/ URETERAL STENT PLACEMENT Right 06/30/2016   Procedure: CYSTOSCOPY WITH RETROGRADE PYELOGRAM/URETERAL STENT EXCHANGE;  Surgeon: Alexis Frock, MD;  Location: [facility];  Service: Urology;  Laterality: Right;  . CYSTOSCOPY W/ URETERAL STENT PLACEMENT N/A 06/01/2017   Procedure: CYSTOSCOPY WITH EXAM UNDER ANESTHESIA;  Surgeon: Alexis Frock, MD;  [facility];  Service: Urology;  Laterality: N/A;  . CYSTOSCOPY W/ URETERAL STENT PLACEMENT Right 08/17/2017   Procedure: CYSTOSCOPY WITH RETROGRADE PYELOGRAM/URETERAL STENT REMOVAL;  Surgeon: Alexis Frock, MD;  Location: [facility];  Service: Urology;  Laterality: Right;  . CYSTOSCOPY WITH RETROGRADE PYELOGRAM, URETEROSCOPY AND STENT PLACEMENT Right 03/20/2015   Procedure: CYSTOSCOPY WITH RETROGRADE PYELOGRAM, URETEROSCOPY WITH BALLOON DILATION AND STENT PLACEMENT ON RIGHT;  Surgeon: Alexis Frock, MD;  Location: [facility];  Service: Urology;  Laterality: Right;  . CYSTOSCOPY WITH RETROGRADE PYELOGRAM, URETEROSCOPY AND STENT PLACEMENT Right 05/02/2015   Procedure: CYSTOSCOPY WITH RIGHT RETROGRADE PYELOGRAM,  DIAGNOSTIC URETEROSCOPY AND STENT PULL ;  Surgeon: Alexis Frock, MD;  Location: [facility];  Service: Urology;  Laterality: Right;  . CYSTOSCOPY WITH RETROGRADE PYELOGRAM, URETEROSCOPY AND STENT PLACEMENT Right 09/05/2015   Procedure: CYSTOSCOPY WITH RETROGRADE PYELOGRAM,  AND STENT PLACEMENT;  Surgeon: Alexis Frock, MD;  [facility];  Service: Urology;  Laterality: Right;  . CYSTOSCOPY WITH RETROGRADE PYELOGRAM, URETEROSCOPY AND STENT PLACEMENT Right 04/01/2017   Procedure: CYSTOSCOPY WITH RETROGRADE PYELOGRAM, URETEROSCOPY AND STENT PLACEMENT;  Surgeon: Alexis Frock, MD;  [facility];  Service: Urology;  Laterality: Right;  . CYSTOSCOPY WITH STENT PLACEMENT Right 10/27/2016   Procedure: CYSTOSCOPY WITH STENT CHANGE and right retrograde pyelogram;  Surgeon: Alexis Frock, MD;  Location: [facility];  Service: Urology;  Laterality: Right;  . EUS N/A 10/02/2014   Procedure: LOWER ENDOSCOPIC ULTRASOUND (EUS);  Surgeon: Arta Silence, MD;  Location: [facility];  Service: Endoscopy;  Laterality: N/A;  . EXCISION SOFT TISSUE MASS RIGHT FOREMAN  12-08-2006  . EYE SURGERY  as child   pytosis of eyelids repair  . INCISION AND DRAINAGE OF WOUND Bilateral 12/26/2015   Procedure: DEBRIDEMENT OF BILATERAL MASTECTOMY FLAPS;  Surgeon: Irene Limbo, MD;  Location: [place];  Service: Plastics;  Laterality: Bilateral;  . IR CV LINE INJECTION  05/31/2017  . IR FLUORO GUIDE CV LINE LEFT  05/31/2017  . IR FLUORO GUIDE CV LINE RIGHT  04/06/2017  . IR FLUORO GUIDE CV MIDLINE PICC RIGHT  05/30/2017  . IR NEPHROSTOGRAM LEFT INITIAL PLACEMENT  09/02/2017  . IR NEPHROSTOGRAM LEFT THRU EXISTING ACCESS  11/29/2017  . IR NEPHROSTOGRAM RIGHT INITIAL PLACEMENT  09/02/2017  . IR NEPHROSTOGRAM RIGHT THRU EXISTING ACCESS  09/13/2017  . IR NEPHROSTOGRAM RIGHT THRU EXISTING ACCESS  11/29/2017  . IR NEPHROSTOMY EXCHANGE LEFT  11/28/2017  . IR NEPHROSTOMY EXCHANGE LEFT  01/05/2018  . IR NEPHROSTOMY EXCHANGE LEFT  02/16/2018  . IR NEPHROSTOMY EXCHANGE LEFT  03/30/2018  . IR NEPHROSTOMY EXCHANGE RIGHT  10/02/2017  . IR NEPHROSTOMY EXCHANGE RIGHT  11/28/2017  . IR NEPHROSTOMY EXCHANGE RIGHT  01/05/2018  . IR NEPHROSTOMY EXCHANGE RIGHT  02/16/2018  . IR NEPHROSTOMY EXCHANGE RIGHT  03/30/2018  . IR NEPHROSTOMY PLACEMENT LEFT  10/02/2017  . IR RADIOLOGIST EVAL & MGMT  05/03/2017  . IR US GUIDE VASC ACCESS LEFT  05/31/2017  . IR US GUIDE VASC ACCESS RIGHT  04/06/2017  . IR US GUIDE VASC ACCESS RIGHT  05/30/2017  . LAPAROSCOPIC CHOLECYSTECTOMY  1990  . LIPOSUCTION  WITH LIPOFILLING Bilateral 04/16/2016   Procedure: LIPOSUCTION WITH LIPOFILLING TO BILATERAL CHEST;  Surgeon: Irene Limbo, MD;  Location: [place];  Service: Plastics;  Laterality: Bilateral;  . MASTECTOMY W/ SENTINEL NODE BIOPSY Bilateral 12/11/2015   Procedure: RIGHT PROPHYLACTIC MASTECTOMY, LEFT TOTAL MASTECTOMY WITH LEFT AXILLARY SENTINEL LYMPH NODE BIOPSY;  Surgeon: Stark Klein, MD;  Location: [place];  Service: General;  Laterality: Bilateral;  . OSTOMY N/A 11/19/2014   Procedure: OSTOMY;  Surgeon: Michael [name], MD;  [facility];  Service: General;  Laterality: N/A;  . PROCTOSCOPY N/A 04/01/2017   Procedure: RIDGE PROCTOSCOPY;  Surgeon: Michael [name], MD;  [facility];  Service: General;  Laterality: N/A;  . REMOVAL OF BILATERAL TISSUE EXPANDERS WITH PLACEMENT OF BILATERAL BREAST IMPLANTS Bilateral 04/16/2016   Procedure: REMOVAL OF BILATERAL TISSUE EXPANDERS WITH PLACEMENT OF BILATERAL BREAST IMPLANTS;  Surgeon: Irene Limbo, MD;  Location: [place];  Service: Plastics;  Laterality: Bilateral;  . ROBOTIC ASSISTED LAP VAGINAL HYSTERECTOMY N/A 11/19/2014   Procedure: ROBOTIC LYSIS OF ADHESIONS, CONVERTED TO LAPAROTOMY RADICAL UPPER VAGINECTOMY,LOW ANTERIOR BOWEL RESECTION, COLOSTOMY, BILATERAL URETERAL STENT PLACEMENT AND CYSTONOMY CLOSURE;  Surgeon: Everitt Amber, MD;  [facility];  Service: Gynecology;  Laterality: N/A;  . TISSUE EXPANDER FILLING Bilateral 12/26/2015   Procedure: EXPANSION OF BILATERAL CHEST TISSUE EXPANDERS (60 mL- Right; 75 mL- Left);  Surgeon: Irene Limbo, MD;  Location: [place];  Service: Plastics;  Laterality: Bilateral;  . TONSILLECTOMY    . TOTAL ABDOMINAL HYSTERECTOMY  March 2006   [facility]   and Bilateral Salpingoophorectomy/  staging for Ovarian cancer/  an  . XI ROBOTIC ASSISTED LOWER ANTERIOR RESECTION N/A 04/01/2017   Procedure: XI ROBOTIC VS LAPAROSCOPIC COLOSTOMY TAKEDOWN WITH LYSIS OF  ADHESIONS.;  Surgeon: Michael [name], MD;  [facility];  Service: General;  Laterality: N/A;  ERAS PATHWAY    SOCIAL HISTORY: Social History   Tobacco Use  . Smoking status: Never Smoker  . Smokeless tobacco: Never Used  Substance Use Topics  . Alcohol use: Not Currently  . Drug use: No    FAMILY HISTORY: Family History  Problem Relation Age of Onset  . Cancer Mother 32       stomach ca  . Hypertension Mother   . Cancer Father 85       prostate ca  . Diabetes Father   . Heart disease Father        CABG  . Hypertension Father   . Hyperlipidemia Father   . Obesity Father   . Breast cancer Maternal Aunt        dx in her 52s  . Lymphoma Paternal Aunt   . Brain cancer Paternal Grandfather   . Ovarian cancer Other   . Diabetes Sister   . Hypertension Brother y-10  . Heart disease Brother        CABG  . Diabetes Brother     ROS: Review of Systems  Constitutional: Positive for weight loss.  All other systems reviewed and are negative.   PHYSICAL EXAM: Blood pressure 119/68, pulse 81, temperature 98.1 F (36.7 C), temperature source Oral, height 5' 2"  (1.575 m), weight 201 lb (91.2 kg), SpO2 97 %. Body mass index is 36.76 kg/m. Physical Exam  Constitutional: She is oriented to person, place, and time. She appears well-developed and well-nourished.  Eyes: EOM are  normal.  Neck: Normal range of motion.  Pulmonary/Chest: Effort normal.  Neurological: She is alert and oriented to person, place, and time.  Skin: Skin is warm and dry.  Psychiatric: She has a normal mood and affect. Her behavior is normal.  Vitals reviewed.   RECENT LABS AND TESTS: BMET    Component Value Date/Time   NA 137 03/27/2018 1324   NA 141 01/24/2017 1228   K 4.0 03/27/2018 1324   K 4.6 01/24/2017 1228   CL 104 03/27/2018 1324   CO2 26 03/27/2018 1324   CO2 29 01/24/2017 1228   GLUCOSE 141 (H) 03/27/2018 1324   GLUCOSE 84 01/24/2017 1228   BUN 34 (H) 03/27/2018 1324   BUN 22.2  01/24/2017 1228   CREATININE 1.98 (H) 03/27/2018 1324   CREATININE 0.8 01/24/2017 1228   CALCIUM 9.7 03/27/2018 1324   CALCIUM 10.2 01/24/2017 1228   GFRNONAA 26 (L) 03/27/2018 1324   GFRNONAA 78 12/16/2014 1530   GFRAA 30 (L) 03/27/2018 1324   GFRAA >89 12/16/2014 1530   Lab Results  Component Value Date   HGBA1C 7.9 (H) 09/01/2017   HGBA1C 5.3 04/01/2017   HGBA1C 5.8 (H) 09/05/2015   HGBA1C 6.8 (H) 11/21/2014   HGBA1C 6.3 (H) 11/07/2014   No results found for: INSULIN CBC    Component Value Date/Time   WBC 5.1 03/27/2018 1324   WBC 2.6 (L) 12/01/2017 0406   RBC 3.62 (L) 03/27/2018 1324   HGB 10.2 (L) 03/27/2018 1324   HGB 12.4 07/29/2017 1444   HCT 31.6 (L) 03/27/2018 1324   HCT 38.0 07/29/2017 1444   PLT 268 03/27/2018 1324   PLT 260 07/29/2017 1444   MCV 87.2 03/27/2018 1324   MCV 90.9 07/29/2017 1444   MCH 28.1 03/27/2018 1324   MCHC 32.2 03/27/2018 1324   RDW 15.8 (H) 03/27/2018 1324   RDW 15.5 (H) 07/29/2017 1444   LYMPHSABS 0.5 (L) 03/27/2018 1324   LYMPHSABS 0.8 (L) 07/29/2017 1444   MONOABS 0.9 03/27/2018 1324   MONOABS 1.0 (H) 07/29/2017 1444   EOSABS 0.1 03/27/2018 1324   EOSABS 0.0 07/29/2017 1444   BASOSABS 0.1 03/27/2018 1324   BASOSABS 0.0 07/29/2017 1444   Iron/TIBC/Ferritin/ %Sat    Component Value Date/Time   IRON 7 (L) 08/31/2017 0451   IRON 12 (L) 11/29/2014 1251   TIBC 164 (L) 08/31/2017 0451   TIBC 331 11/29/2014 1251   FERRITIN 27 11/29/2014 1251   IRONPCTSAT 4 (L) 08/31/2017 0451   IRONPCTSAT 4 (L) 11/29/2014 1251   Lipid Panel     Component Value Date/Time   CHOL 109 08/28/2015 1617   TRIG 89 04/18/2017 0427   HDL 43.80 08/28/2015 1617   CHOLHDL 2 08/28/2015 1617   VLDL 19.8 08/28/2015 1617   LDLCALC 45 08/28/2015 1617   Hepatic Function Panel     Component Value Date/Time   PROT 8.3 (H) 03/27/2018 1324   PROT 6.9 01/24/2017 1228   ALBUMIN 3.0 (L) 03/27/2018 1324   ALBUMIN 3.4 (L) 01/24/2017 1228   AST 14 (L)  03/27/2018 1324   AST 16 01/24/2017 1228   ALT 18 03/27/2018 1324   ALT 14 01/24/2017 1228   ALKPHOS 202 (H) 03/27/2018 1324   ALKPHOS 90 01/24/2017 1228   BILITOT 0.2 (L) 03/27/2018 1324   BILITOT 0.34 01/24/2017 1228      Component Value Date/Time   TSH 2.408 08/30/2017 0623   TSH 0.63 08/28/2015 1617    ASSESSMENT AND PLAN: Type 2  diabetes mellitus with other diabetic kidney complication, with long-term current use of insulin (HCC)  Vitamin D deficiency  Class 2 severe obesity with serious comorbidity and body mass index (BMI) of 36.0 to 36.9 in adult, unspecified obesity type ([place])  PLAN: Diabetes II Leanah has been given extensive diabetes education by myself today including ideal fasting and post-prandial blood glucose readings, individual ideal HgA1c goals  and hypoglycemia prevention. We discussed the importance of good blood sugar control to decrease the likelihood of diabetic complications such as nephropathy, neuropathy, limb loss, blindness, coronary artery disease, and death. We discussed the importance of intensive lifestyle modification including diet, exercise and weight loss as the first line treatment for diabetes. Makailey agrees to continue her diabetes medications and will follow up at the agreed upon time. Will have the patient decrease her Lantus to 8 units BID.   Vitamin D Deficiency [name] was informed that low vitamin D levels contributes to fatigue and are associated with obesity, breast, and colon cancer. She agrees to continue to take prescription Vit D @50 ,000 IU every week and will follow up for routine testing of vitamin D, at least 2-3 times per year. She was informed of the risk of over-replacement of vitamin D and agrees to not increase her dose unless she discusses this with [place] first. Due to patients CRF will continue Vit D as is and will continue to monitor as Vit D will likely increase as she continues to burn fat.   Obesity [name] is currently in the  action stage of change. As such, her goal is to continue with weight loss efforts She has agreed to keep a food journal with 1200-1400 calories and 60-80 protein  Chaselyn has been instructed to work up to a goal of 150 minutes of combined cardio and strengthening exercise per week for weight loss and overall health benefits. We discussed the following Behavioral Modification Stratagies today: increasing lean protein intake, decreasing simple carbohydrates  and work on meal planning and easy cooking plans and better snacking choices   Zella has agreed to follow up with our clinic in 2 weeks. She was informed of the importance of frequent follow up visits to maximize her success with intensive lifestyle modifications for her multiple health conditions.   OBESITY BEHAVIORAL INTERVENTION VISIT  Today's visit was # 2   Starting weight: 208 lb Starting date: 04/05/18 Today's weight : Weight: 201 lb (91.2 kg)  Today's date: 04/20/18 Total lbs lost to date: 7 At least 15 minutes were spent on discussing the following behavioral intervention visit.   ASK: We discussed the diagnosis of obesity with [name] today and Anjeanette agreed to give [place] permission to discuss obesity behavioral modification therapy today.  ASSESS: Sonal has the diagnosis of obesity and her BMI today is @TBMI @ Marnesha is in the action stage of change   ADVISE: Alainah was educated on the multiple health risks of obesity as well as the benefit of weight loss to improve her health. She was advised of the need for long term treatment and the importance of lifestyle modifications to improve her current health and to decrease her risk of future health problems.  AGREE: Multiple dietary modification options and treatment options were discussed and  Oswin agreed to follow the recommendations documented in the above note.  ARRANGE: Shelisha was educated on the importance of frequent visits to treat obesity as outlined per CMS and  USPSTF guidelines and agreed to schedule her next follow up appointment today.  I, April Moore , am acting as transcriptionist for Dr Dennard Nip  I have reviewed the above documentation for accuracy and completeness, and I agree with the above. -Dennard Nip, MD

## 2018-04-25 DIAGNOSIS — L739 Follicular disorder, unspecified: Secondary | ICD-10-CM | POA: Diagnosis not present

## 2018-04-26 DIAGNOSIS — T82594D Other mechanical complication of infusion catheter, subsequent encounter: Secondary | ICD-10-CM | POA: Diagnosis not present

## 2018-04-26 DIAGNOSIS — T83592A Infection and inflammatory reaction due to indwelling ureteral stent, initial encounter: Secondary | ICD-10-CM | POA: Diagnosis not present

## 2018-04-27 DIAGNOSIS — L89319 Pressure ulcer of right buttock, unspecified stage: Secondary | ICD-10-CM | POA: Diagnosis not present

## 2018-04-27 DIAGNOSIS — Z933 Colostomy status: Secondary | ICD-10-CM | POA: Diagnosis not present

## 2018-04-28 DIAGNOSIS — T83592A Infection and inflammatory reaction due to indwelling ureteral stent, initial encounter: Secondary | ICD-10-CM | POA: Diagnosis not present

## 2018-04-28 DIAGNOSIS — T82594D Other mechanical complication of infusion catheter, subsequent encounter: Secondary | ICD-10-CM | POA: Diagnosis not present

## 2018-05-02 ENCOUNTER — Ambulatory Visit (INDEPENDENT_AMBULATORY_CARE_PROVIDER_SITE_OTHER): Payer: BLUE CROSS/BLUE SHIELD | Admitting: Family Medicine

## 2018-05-02 VITALS — BP 97/64 | HR 85 | Temp 97.9°F | Ht 62.0 in | Wt 198.0 lb

## 2018-05-02 DIAGNOSIS — Z794 Long term (current) use of insulin: Secondary | ICD-10-CM | POA: Diagnosis not present

## 2018-05-02 DIAGNOSIS — Z6836 Body mass index (BMI) 36.0-36.9, adult: Secondary | ICD-10-CM

## 2018-05-02 DIAGNOSIS — E119 Type 2 diabetes mellitus without complications: Secondary | ICD-10-CM

## 2018-05-03 DIAGNOSIS — T82594D Other mechanical complication of infusion catheter, subsequent encounter: Secondary | ICD-10-CM | POA: Diagnosis not present

## 2018-05-03 DIAGNOSIS — L89319 Pressure ulcer of right buttock, unspecified stage: Secondary | ICD-10-CM | POA: Diagnosis not present

## 2018-05-03 DIAGNOSIS — T83592A Infection and inflammatory reaction due to indwelling ureteral stent, initial encounter: Secondary | ICD-10-CM | POA: Diagnosis not present

## 2018-05-03 DIAGNOSIS — E860 Dehydration: Secondary | ICD-10-CM | POA: Diagnosis not present

## 2018-05-03 DIAGNOSIS — Z933 Colostomy status: Secondary | ICD-10-CM | POA: Diagnosis not present

## 2018-05-03 NOTE — Progress Notes (Signed)
Office: [phone]  /  Fax: [phone]   HPI:   Chief Complaint: OBESITY [name] is here to discuss her progress with her obesity treatment plan. She is on the keep a food journal with 1200-1400 calories and 60-80 grams of protein daily and is following her eating plan approximately 98 % of the time. She states she is exercising 0 minutes 0 times per week. [name] continues to do well with weight loss. She is going back and forth between Category 2 and journaling. She needs to get her nephrostomy tube possibly removed.  Her weight is 198 lb (89.8 kg) today and has had a weight loss of 3 pounds over a period of 1 to 2 weeks since her last visit. She has lost 10 lbs since starting treatment with [place].  Diabetes II [name] has a diagnosis of diabetes type II. [name] states fasting BGs are stable with diet and exercise. She denies hypoglycemia. She has decreased her Lantus to 8 units BID and is still well controlled. She denies nausea, or vomiting. She has been working on intensive lifestyle modifications including diet, exercise, and weight loss to help control her blood glucose levels.  ALLERGIES: Allergies  Allergen Reactions  . Penicillins Swelling    Facial swelling/childhood allergy Has patient had a PCN reaction causing immediate rash, facial/tongue/throat swelling, SOB or lightheadedness with hypotension: Yes Has patient had a PCN reaction causing severe rash involving mucus membranes or skin necrosis: Yes Has patient had a PCN reaction that required hospitalization yes Has patient had a PCN reaction occurring within the last 10 years: No If all of the above answers are "NO", then may proceed with Cephalosporin use.   . Cefaclor Rash    Ceclor  . Erythromycin Other (See Comments)    Gastritis, abd cramps  . Tape Rash    blisters  . Trimethoprim Rash  . Ultram [Tramadol] Hives  . Cephalosporins Rash  . Fluconazole Rash  . Oxycodone Other (See Comments)    " I just feel weird"  .  Pectin Rash    Pectin ring for stoma  . Septra [Sulfamethoxazole-Trimethoprim] Rash  . Sulfa Antibiotics Rash    MEDICATIONS: Current Outpatient Medications on File Prior to Visit  Medication Sig Dispense Refill  . acetaminophen (TYLENOL) 325 MG tablet Take 2 tablets (650 mg total) by mouth every 6 (six) hours as needed. 30 tablet 1  . anastrozole (ARIMIDEX) 1 MG tablet TAKE 1 TABLET DAILY 90 tablet 3  . Biotin 5 MG TABS Take 5 mg by mouth every morning.     . Cholecalciferol (VITAMIN D3) 10000 UNITS capsule Take 10,000 Units by mouth once a week. Sunday evening's    . clobetasol (OLUX) 0.05 % topical foam Apply topically 2 (two) times daily.    . collagenase (SANTYL) ointment Apply 1 application topically daily as needed (skin care).     . diphenhydrAMINE (BENADRYL) 25 mg capsule Take 1 capsule (25 mg total) by mouth every 8 (eight) hours as needed for itching, allergies or sleep. 30 capsule 0  . diphenoxylate-atropine (LOMOTIL) 2.5-0.025 MG tablet Take 1 tablet by mouth 4 (four) times daily as needed for diarrhea or loose stools. TO PREVENT LOOSE BOWEL MOVEMENTS (Patient taking differently: Take 1 tablet by mouth every other day. TO PREVENT LOOSE BOWEL MOVEMENTS) 30 tablet 0  . fentaNYL (DURAGESIC) 50 MCG/HR Place 1 patch (50 mcg total) onto the skin every 3 (three) days. 10 patch 0  . ferrous sulfate 325 (65 FE) MG tablet Take  1 tablet (325 mg total) by mouth at bedtime. 30 tablet 3  . HYDROcodone-acetaminophen (NORCO/VICODIN) 5-325 MG tablet Take 1 tablet by mouth every 6 (six) hours as needed for moderate pain. 30 tablet 0  . insulin glargine (LANTUS) 100 UNIT/ML injection Inject 8 Units into the skin 2 (two) times daily.     [name] Kitchen JANUVIA 50 MG tablet     . levothyroxine (SYNTHROID, LEVOTHROID) 150 MCG tablet Take 1 tablet (150 mcg total) by mouth daily before breakfast. 30 tablet 1  . loperamide (IMODIUM) 2 MG capsule Take 1-2 capsules (2-4 mg total) by mouth every 8 (eight) hours as needed  for diarrhea or loose stools (Use if >2 BM every 8 hours). 30 capsule 0  . loratadine (CLARITIN) 10 MG tablet Take 10 mg by mouth every morning.     [name] Kitchen LYRICA 50 MG capsule TAKE 1 CAPSULE(50 MG) BY MOUTH TWICE DAILY 180 capsule 0  . nitrofurantoin, macrocrystal-monohydrate, (MACROBID) 100 MG capsule Take 100 mg by mouth at bedtime.    [name] Kitchen omega-3 acid ethyl esters (LOVAZA) 1 G capsule Take 1 g by mouth 2 (two) times daily.     [name] Glycol-Propyl Glycol (SYSTANE OP) Place 1 drop into both eyes daily as needed (dry eyes).     . Prenatal Vit-Fe Fumarate-FA (PRENATAL VITAMIN PO) Take 1 capsule by mouth daily. Takes prenatal because there are no dyes in it    . protein supplement shake (PREMIER PROTEIN) LIQD Take 325 mLs (11 oz total) by mouth 2 (two) times daily as needed (Pt to request). 60 Can 1  . ranitidine (ZANTAC) 150 MG tablet Take 1 tablet (150 mg total) by mouth 2 (two) times daily as needed for heartburn. (Patient taking differently: Take 150 mg by mouth at bedtime. ) 60 tablet 1  . rosuvastatin (CRESTOR) 10 MG tablet Take 10 mg by mouth every evening.     . Saccharomyces boulardii (FLORASTOR PO) Take 1 capsule by mouth daily.    . sodium bicarbonate 650 MG tablet Take 650 mg by mouth 2 (two) times daily.    . sodium chloride 0.9 % injection Inject 10 mLs into the vein daily.    . Tbo-Filgrastim (GRANIX) 480 MCG/0.8ML SOSY injection Inject 0.8 mLs (480 mcg total) into the skin daily. 30 Syringe 11   No current facility-administered medications on file prior to visit.     PAST MEDICAL HISTORY: Past Medical History:  Diagnosis Date  . Adrenal adenoma, left 02/08/2016   CT: stable benign  . Anemia in neoplastic disease   . Back pain   . Benign essential HTN   . 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 Cancer of corpus uteri, except isthmus [facility])  oncologist-- dr Denman [name] and dr Alvy Bimler    10-15-2004  dx endometroid endometrial and ovarian cancer s/p  chemotheapy and surgery(TAH w/ BSO) :  recurrent 11-19-2014 post pelvic surgery and radiation 01-29-2015 to 03-10-2015  . Chronic idiopathic neutropenia (HCC)    presumed related to chemotherapy March 2006--- followed by dr Alvy Bimler (treatment w/ G-CSF injections  . Chronic nausea   . Chronic pain    perineal/ anal  area from bladder pad irritates skin , right flank pain  . CKD stage G2/A3, GFR 60-89 and albumin creatinine ratio >300 mg/g    nephrologist-  dr  elizabeth upton  . Colovesical fistula   . Diabetic retinopathy, background ([place])   . Difficult intravenous access    small veins--- hx PICC lines  . DM type 2 (diabetes mellitus, type 2) ([place])    monitored by dr Legrand Como altheimer  . Dysuria   . Environmental and seasonal allergies   . Fatty liver 02/08/2016   CT  . Generalized muscle weakness   . GERD (gastroesophageal reflux disease)   . Hiatal hernia   . History of abdominal abscess 04/16/2017   post surgery 04-01-2017  --- resolved 10/ 2018  . History of gastric polyp    2014  duodenum  . History of ileus 04/16/2017   resolved w/ no surgical intervention  . History of radiation therapy    01-29-2015 to 03-10-2015  pelvis 50.4Gy  . Hypothyroidism    monitored by dr Legrand Como altheimer  . IBS (irritable bowel syndrome)   . Ileostomy in place [facility]) 04/01/2017   created at same time colostomy takedown.  . Joint pain   . Leg edema   . Lower urinary tract symptoms (LUTS)    urge urinary  incontinence  . Mixed dyslipidemia   . Multiple thyroid nodules    Managed by Dr. Harlow Asa  . Nephrostomy status ([place])   . Palpitations   . Pelvic abscess in female 04/16/2017  . PONV (postoperative nausea and vomiting)    "scopolamine patch works for me"  . Radiation-induced dermatitis    contact dermatitis , radiation completed,  rash only on ankles now.  . Seasonal allergies   . Ureteral stricture, right UROLOGIT-  DR [facility]   CHRONIC--  TREATMENT URETERAL STENT  . 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 Vitamin D deficiency   . Wears glasses     PAST SURGICAL HISTORY: Past Surgical History:  Procedure Laterality Date  . APPENDECTOMY    . biopsy thyroid nodules    . BREAST RECONSTRUCTION WITH PLACEMENT OF TISSUE EXPANDER AND FLEX HD (ACELLULAR HYDRATED DERMIS) Bilateral 12/11/2015   Procedure: BILATERAL BREAST RECONSTRUCTION WITH PLACEMENT OF TISSUE EXPANDERS;  Surgeon: Irene Limbo, MD;  Location: [place];  Service: Plastics;  Laterality: Bilateral;  . COLONOSCOPY WITH PROPOFOL N/A 08/21/2013   Procedure: COLONOSCOPY WITH PROPOFOL;  Surgeon: Cleotis Nipper, MD;  [facility];  Service: Endoscopy;  Laterality: N/A;  . COLOSTOMY TAKEDOWN N/A 12/04/2014   Procedure: LAPROSCOPIC LYSIS OF ADHESIONS, SPLENIC MOBILIZATION, RELOCATION OF COLOSTOMY, DEBRIDEMENT INITIAL COLOSTOMY SITE;  Surgeon: Michael [name], MD;  [facility];  Service: General;  Laterality: N/A;  . CYSTOGRAM N/A 06/01/2017   Procedure: CYSTOGRAM;  Surgeon: Alexis Frock, MD;  [facility];  Service: Urology;  Laterality: N/A;  . CYSTOSCOPY W/ RETROGRADES Right 11/21/2015   Procedure: CYSTOSCOPY WITH RETROGRADE PYELOGRAM;  Surgeon: Alexis Frock, MD;  [facility];  Service: Urology;  Laterality: Right;  . CYSTOSCOPY W/ URETERAL STENT PLACEMENT Right 11/21/2015   Procedure: CYSTOSCOPY WITH STENT REPLACEMENT;  Surgeon: Alexis Frock, MD;  [facility];  Service: Urology;  Laterality: Right;  . CYSTOSCOPY W/ URETERAL STENT PLACEMENT Right 03/10/2016   Procedure: CYSTOSCOPY WITH STENT REPLACEMENT;  Surgeon: Alexis Frock, MD;  Location: [facility];  Service: Urology;  Laterality: Right;  . CYSTOSCOPY W/ URETERAL STENT PLACEMENT Right 06/30/2016   Procedure: CYSTOSCOPY WITH RETROGRADE PYELOGRAM/URETERAL STENT  EXCHANGE;  Surgeon: Alexis Frock, MD;  Location[facility];  Service: Urology;  Laterality: Right;  . CYSTOSCOPY W/ URETERAL STENT PLACEMENT N/A 06/01/2017  Procedure: CYSTOSCOPY WITH EXAM UNDER ANESTHESIA;  Surgeon: Alexis Frock, MD;  [facility];  Service: Urology;  Laterality: N/A;  . CYSTOSCOPY W/ URETERAL STENT PLACEMENT Right 08/17/2017   Procedure: CYSTOSCOPY WITH RETROGRADE PYELOGRAM/URETERAL STENT REMOVAL;  Surgeon: Alexis Frock, MD;  Location: [facility];  Service: Urology;  Laterality: Right;  . CYSTOSCOPY WITH RETROGRADE PYELOGRAM, URETEROSCOPY AND STENT PLACEMENT Right 03/20/2015   Procedure: CYSTOSCOPY WITH RETROGRADE PYELOGRAM, URETEROSCOPY WITH BALLOON DILATION AND STENT PLACEMENT ON RIGHT;  Surgeon: Alexis Frock, MD;  Location: [facility];  Service: Urology;  Laterality: Right;  . CYSTOSCOPY WITH RETROGRADE PYELOGRAM, URETEROSCOPY AND STENT PLACEMENT Right 05/02/2015   Procedure: CYSTOSCOPY WITH RIGHT RETROGRADE PYELOGRAM,  DIAGNOSTIC URETEROSCOPY AND STENT PULL ;  Surgeon: Alexis Frock, MD;  Location: [facility];  Service: Urology;  Laterality: Right;  . CYSTOSCOPY WITH RETROGRADE PYELOGRAM, URETEROSCOPY AND STENT PLACEMENT Right 09/05/2015   Procedure: CYSTOSCOPY WITH RETROGRADE PYELOGRAM,  AND STENT PLACEMENT;  Surgeon: Alexis Frock, MD;  [facility];  Service: Urology;  Laterality: Right;  . CYSTOSCOPY WITH RETROGRADE PYELOGRAM, URETEROSCOPY AND STENT PLACEMENT Right 04/01/2017   Procedure: CYSTOSCOPY WITH RETROGRADE PYELOGRAM, URETEROSCOPY AND STENT PLACEMENT;  Surgeon: Alexis Frock, MD;  [facility];  Service: Urology;  Laterality: Right;  . CYSTOSCOPY WITH STENT PLACEMENT Right 10/27/2016   Procedure: CYSTOSCOPY WITH STENT CHANGE and right retrograde pyelogram;  Surgeon: Alexis Frock, MD;  Location: [facility];  Service: Urology;  Laterality: Right;  . EUS N/A 10/02/2014     Procedure: LOWER ENDOSCOPIC ULTRASOUND (EUS);  Surgeon: Arta Silence, MD;  Location: [facility];  Service: Endoscopy;  Laterality: N/A;  . EXCISION SOFT TISSUE MASS RIGHT FOREMAN  12-08-2006  . EYE SURGERY  as child   pytosis of eyelids repair  . INCISION AND DRAINAGE OF WOUND Bilateral 12/26/2015   Procedure: DEBRIDEMENT OF BILATERAL MASTECTOMY FLAPS;  Surgeon: Irene Limbo, MD;  Location: [name];  Service: Plastics;  Laterality: Bilateral;  . IR CV LINE INJECTION  05/31/2017  . IR FLUORO GUIDE CV LINE LEFT  05/31/2017  . IR FLUORO GUIDE CV LINE RIGHT  04/06/2017  . IR FLUORO GUIDE CV MIDLINE PICC RIGHT  05/30/2017  . IR NEPHROSTOGRAM LEFT INITIAL PLACEMENT  09/02/2017  . IR NEPHROSTOGRAM LEFT THRU EXISTING ACCESS  11/29/2017  . IR NEPHROSTOGRAM RIGHT INITIAL PLACEMENT  09/02/2017  . IR NEPHROSTOGRAM RIGHT THRU EXISTING ACCESS  09/13/2017  . IR NEPHROSTOGRAM RIGHT THRU EXISTING ACCESS  11/29/2017  . IR NEPHROSTOMY EXCHANGE LEFT  11/28/2017  . IR NEPHROSTOMY EXCHANGE LEFT  01/05/2018  . IR NEPHROSTOMY EXCHANGE LEFT  02/16/2018  . IR NEPHROSTOMY EXCHANGE LEFT  03/30/2018  . IR NEPHROSTOMY EXCHANGE RIGHT  10/02/2017  . IR NEPHROSTOMY EXCHANGE RIGHT  11/28/2017  . IR NEPHROSTOMY EXCHANGE RIGHT  01/05/2018  . IR NEPHROSTOMY EXCHANGE RIGHT  02/16/2018  . IR NEPHROSTOMY EXCHANGE RIGHT  03/30/2018  . IR NEPHROSTOMY PLACEMENT LEFT  10/02/2017  . IR RADIOLOGIST EVAL & MGMT  05/03/2017  . IR US GUIDE VASC ACCESS LEFT  05/31/2017  . IR US GUIDE VASC ACCESS RIGHT  04/06/2017  . IR US GUIDE VASC ACCESS RIGHT  05/30/2017  . LAPAROSCOPIC CHOLECYSTECTOMY  1990  . LIPOSUCTION WITH LIPOFILLING Bilateral 04/16/2016   Procedure: LIPOSUCTION WITH LIPOFILLING TO BILATERAL CHEST;  Surgeon: Irene Limbo, MD;  Location: [place];  Service: Plastics;  Laterality: Bilateral;  . MASTECTOMY W/ SENTINEL NODE BIOPSY Bilateral 12/11/2015   Procedure:  RIGHT PROPHYLACTIC MASTECTOMY, LEFT TOTAL  MASTECTOMY WITH LEFT AXILLARY SENTINEL LYMPH NODE BIOPSY;  Surgeon: Stark Klein, MD;  Location: [place];  Service: General;  Laterality: Bilateral;  . OSTOMY N/A 11/19/2014   Procedure: OSTOMY;  Surgeon: Michael [name], MD;  [facility];  Service: General;  Laterality: N/A;  . PROCTOSCOPY N/A 04/01/2017   Procedure: RIDGE PROCTOSCOPY;  Surgeon: Michael [name], MD;  [facility];  Service: General;  Laterality: N/A;  . REMOVAL OF BILATERAL TISSUE EXPANDERS WITH PLACEMENT OF BILATERAL BREAST IMPLANTS Bilateral 04/16/2016   Procedure: REMOVAL OF BILATERAL TISSUE EXPANDERS WITH PLACEMENT OF BILATERAL BREAST IMPLANTS;  Surgeon: Irene Limbo, MD;  Location: [place];  Service: Plastics;  Laterality: Bilateral;  . ROBOTIC ASSISTED LAP VAGINAL HYSTERECTOMY N/A 11/19/2014   Procedure: ROBOTIC LYSIS OF ADHESIONS, CONVERTED TO LAPAROTOMY RADICAL UPPER VAGINECTOMY,LOW ANTERIOR BOWEL RESECTION, COLOSTOMY, BILATERAL URETERAL STENT PLACEMENT AND CYSTONOMY CLOSURE;  Surgeon: Everitt Amber, MD;  [facility];  Service: Gynecology;  Laterality: N/A;  . TISSUE EXPANDER FILLING Bilateral 12/26/2015   Procedure: EXPANSION OF BILATERAL CHEST TISSUE EXPANDERS (60 mL- Right; 75 mL- Left);  Surgeon: Irene Limbo, MD;  Location: [place];  Service: Plastics;  Laterality: Bilateral;  . TONSILLECTOMY    . TOTAL ABDOMINAL HYSTERECTOMY  March 2006   [facility]   and Bilateral Salpingoophorectomy/  staging for Ovarian cancer/  an  . XI ROBOTIC ASSISTED LOWER ANTERIOR RESECTION N/A 04/01/2017   Procedure: XI ROBOTIC VS LAPAROSCOPIC COLOSTOMY TAKEDOWN WITH LYSIS OF ADHESIONS.;  Surgeon: Michael [name], MD;  [facility];  Service: General;  Laterality: N/A;  ERAS PATHWAY    SOCIAL HISTORY: Social History   Tobacco Use  . Smoking status: Never Smoker  . Smokeless tobacco: Never Used  Substance Use Topics  . Alcohol use: Not Currently  . Drug use: No    FAMILY HISTORY: Family  History  Problem Relation Age of Onset  . Cancer Mother 82       stomach ca  . Hypertension Mother   . Cancer Father 13       prostate ca  . Diabetes Father   . Heart disease Father        CABG  . Hypertension Father   . Hyperlipidemia Father   . Obesity Father   . Breast cancer Maternal Aunt        dx in her 63s  . Lymphoma Paternal Aunt   . Brain cancer Paternal Grandfather   . Ovarian cancer Other   . Diabetes Sister   . Hypertension Brother y-10  . Heart disease Brother        CABG  . Diabetes Brother     ROS: Review of Systems  Constitutional: Positive for weight loss.  Gastrointestinal: Negative for nausea and vomiting.  Endo/Heme/Allergies:       Negative hypoglycemia    PHYSICAL EXAM: Blood pressure 97/64, pulse 85, temperature 97.9 F (36.6 C), temperature source Oral, height _0  (1.575 m), weight 198 lb (89.8 kg), SpO2 95 %. Body mass index is 36.21 kg/m. Physical Exam  Constitutional: She is oriented to person, place, and time. She appears well-developed and well-nourished.  Cardiovascular: Normal rate.  Pulmonary/Chest: Effort normal.  Musculoskeletal: Normal range of motion.  Neurological: She is oriented to person, place, and time.  Skin: Skin is warm and dry.  Psychiatric: She has a normal mood and affect. Her behavior is normal.  Vitals reviewed.   RECENT LABS AND TESTS: BMET  Component Value Date/Time   NA 137 03/27/2018 1324   NA 141 01/24/2017 1228   K 4.0 03/27/2018 1324   K 4.6 01/24/2017 1228   CL 104 03/27/2018 1324   CO2 26 03/27/2018 1324   CO2 29 01/24/2017 1228   GLUCOSE 141 (H) 03/27/2018 1324   GLUCOSE 84 01/24/2017 1228   BUN 34 (H) 03/27/2018 1324   BUN 22.2 01/24/2017 1228   CREATININE 1.98 (H) 03/27/2018 1324   CREATININE 0.8 01/24/2017 1228   CALCIUM 9.7 03/27/2018 1324   CALCIUM 10.2 01/24/2017 1228   GFRNONAA 26 (L) 03/27/2018 1324   GFRNONAA 78 12/16/2014 1530   GFRAA 30 (L) 03/27/2018 1324   GFRAA >89  12/16/2014 1530   Lab Results  Component Value Date   HGBA1C 7.9 (H) 09/01/2017   HGBA1C 5.3 04/01/2017   HGBA1C 5.8 (H) 09/05/2015   HGBA1C 6.8 (H) 11/21/2014   HGBA1C 6.3 (H) 11/07/2014   No results found for: INSULIN CBC    Component Value Date/Time   WBC 5.1 03/27/2018 1324   WBC 2.6 (L) 12/01/2017 0406   RBC 3.62 (L) 03/27/2018 1324   HGB 10.2 (L) 03/27/2018 1324   HGB 12.4 07/29/2017 1444   HCT 31.6 (L) 03/27/2018 1324   HCT 38.0 07/29/2017 1444   PLT 268 03/27/2018 1324   PLT 260 07/29/2017 1444   MCV 87.2 03/27/2018 1324   MCV 90.9 07/29/2017 1444   MCH 28.1 03/27/2018 1324   MCHC 32.2 03/27/2018 1324   RDW 15.8 (H) 03/27/2018 1324   RDW 15.5 (H) 07/29/2017 1444   LYMPHSABS 0.5 (L) 03/27/2018 1324   LYMPHSABS 0.8 (L) 07/29/2017 1444   MONOABS 0.9 03/27/2018 1324   MONOABS 1.0 (H) 07/29/2017 1444   EOSABS 0.1 03/27/2018 1324   EOSABS 0.0 07/29/2017 1444   BASOSABS 0.1 03/27/2018 1324   BASOSABS 0.0 07/29/2017 1444   Iron/TIBC/Ferritin/ %Sat    Component Value Date/Time   IRON 7 (L) 08/31/2017 0451   IRON 12 (L) 11/29/2014 1251   TIBC 164 (L) 08/31/2017 0451   TIBC 331 11/29/2014 1251   FERRITIN 27 11/29/2014 1251   IRONPCTSAT 4 (L) 08/31/2017 0451   IRONPCTSAT 4 (L) 11/29/2014 1251   Lipid Panel     Component Value Date/Time   CHOL 109 08/28/2015 1617   TRIG 89 04/18/2017 0427   HDL 43.80 08/28/2015 1617   CHOLHDL 2 08/28/2015 1617   VLDL 19.8 08/28/2015 1617   LDLCALC 45 08/28/2015 1617   Hepatic Function Panel     Component Value Date/Time   PROT 8.3 (H) 03/27/2018 1324   PROT 6.9 01/24/2017 1228   ALBUMIN 3.0 (L) 03/27/2018 1324   ALBUMIN 3.4 (L) 01/24/2017 1228   AST 14 (L) 03/27/2018 1324   AST 16 01/24/2017 1228   ALT 18 03/27/2018 1324   ALT 14 01/24/2017 1228   ALKPHOS 202 (H) 03/27/2018 1324   ALKPHOS 90 01/24/2017 1228   BILITOT 0.2 (L) 03/27/2018 1324   BILITOT 0.34 01/24/2017 1228      Component Value Date/Time   TSH  2.408 08/30/2017 0623   TSH 0.63 08/28/2015 1617    ASSESSMENT AND PLAN: Type 2 diabetes mellitus without complication, with long-term current use of insulin (HCC)  Class 2 severe obesity with serious comorbidity and body mass index (BMI) of 36.0 to 36.9 in adult, unspecified obesity type ([place])  PLAN:  Diabetes II Erabella has been given extensive diabetes education by myself today including ideal fasting and post-prandial blood glucose  readings, individual ideal Hgb A1c goals and hypoglycemia prevention. We discussed the importance of good blood sugar control to decrease the likelihood of diabetic complications such as nephropathy, neuropathy, limb loss, blindness, coronary artery disease, and death. We discussed the importance of intensive lifestyle modification including diet, exercise and weight loss as the first line treatment for diabetes. Jini was advised if she continues with healthy eating and weight loss, there is a good chance that she will be able to come off her Lantus altogether. Nedda agrees to continue her diabetes medications as is, and will continue to monitor and adjust medications, no changes today. Inza agrees to follow up with our clinic in 2 weeks with Hoyle Sauer, our registered dietitian and in 4 weeks with myself.  I spent > than 50% of the 25 minute visit on counseling as documented in the note.  Obesity Danuta is currently in the action stage of change. As such, her goal is to continue with weight loss efforts She has agreed to keep a food journal with 1200-1400 calories and 75+ grams of protein daily Rye has been instructed to work up to a goal of 150 minutes of combined cardio and strengthening exercise per week for weight loss and overall health benefits. We discussed the following Behavioral Modification Strategies today: increasing lean protein intake and decreasing simple carbohydrates    Panhia has agreed to follow up with our clinic in 2 weeks with Hoyle Sauer, our  registered dietitian and in 4 weeks with myself. She was informed of the importance of frequent follow up visits to maximize her success with intensive lifestyle modifications for her multiple health conditions.   OBESITY BEHAVIORAL INTERVENTION VISIT  Today's visit was # 3   Starting weight: 208 lbs Starting date: 04/05/18 Today's weight : 198 lbs  Today's date: 05/02/2018 Total lbs lost to date: 10    ASK: We discussed the diagnosis of obesity with Carmelia Bake Stender today and Tessie agreed to give [place] permission to discuss obesity behavioral modification therapy today.  ASSESS: Alexie has the diagnosis of obesity and her BMI today is 36.21 [place] is in the action stage of change   ADVISE: Pegi was educated on the multiple health risks of obesity as well as the benefit of weight loss to improve her health. She was advised of the need for long term treatment and the importance of lifestyle modifications to improve her current health and to decrease her risk of future health problems.  AGREE: Multiple dietary modification options and treatment options were discussed and  Kataya agreed to follow the recommendations documented in the above note.  ARRANGE: Keeli was educated on the importance of frequent visits to treat obesity as outlined per CMS and USPSTF guidelines and agreed to schedule her next follow up appointment today.  I, Trixie Dredge, am acting as transcriptionist for Dennard Nip, MD  I have reviewed the above documentation for accuracy and completeness, and I agree with the above. -Dennard Nip, MD

## 2018-05-04 DIAGNOSIS — Z933 Colostomy status: Secondary | ICD-10-CM | POA: Diagnosis not present

## 2018-05-04 DIAGNOSIS — L89319 Pressure ulcer of right buttock, unspecified stage: Secondary | ICD-10-CM | POA: Diagnosis not present

## 2018-05-10 DIAGNOSIS — T82594D Other mechanical complication of infusion catheter, subsequent encounter: Secondary | ICD-10-CM | POA: Diagnosis not present

## 2018-05-10 DIAGNOSIS — Z933 Colostomy status: Secondary | ICD-10-CM | POA: Diagnosis not present

## 2018-05-10 DIAGNOSIS — L89319 Pressure ulcer of right buttock, unspecified stage: Secondary | ICD-10-CM | POA: Diagnosis not present

## 2018-05-12 ENCOUNTER — Other Ambulatory Visit (HOSPITAL_COMMUNITY): Payer: Self-pay | Admitting: Interventional Radiology

## 2018-05-12 ENCOUNTER — Other Ambulatory Visit: Payer: Self-pay | Admitting: Gynecologic Oncology

## 2018-05-12 ENCOUNTER — Ambulatory Visit (HOSPITAL_COMMUNITY)
Admission: RE | Admit: 2018-05-12 | Discharge: 2018-05-12 | Disposition: A | Discharge status: Home / Self Care | Payer: BLUE CROSS/BLUE SHIELD | Source: Ambulatory Visit | Attending: Interventional Radiology | Admitting: Interventional Radiology

## 2018-05-12 ENCOUNTER — Encounter (HOSPITAL_COMMUNITY): Payer: Self-pay | Admitting: Interventional Radiology

## 2018-05-12 DIAGNOSIS — S3720XD Unspecified injury of bladder, subsequent encounter: Secondary | ICD-10-CM

## 2018-05-12 DIAGNOSIS — C569 Malignant neoplasm of unspecified ovary: Secondary | ICD-10-CM

## 2018-05-12 DIAGNOSIS — N135 Crossing vessel and stricture of ureter without hydronephrosis: Secondary | ICD-10-CM

## 2018-05-12 DIAGNOSIS — C541 Malignant neoplasm of endometrium: Secondary | ICD-10-CM

## 2018-05-12 DIAGNOSIS — Z436 Encounter for attention to other artificial openings of urinary tract: Secondary | ICD-10-CM | POA: Diagnosis not present

## 2018-05-12 HISTORY — PX: IR NEPHROSTOMY EXCHANGE RIGHT: IMG6070

## 2018-05-12 HISTORY — PX: IR NEPHROSTOMY EXCHANGE LEFT: IMG6069

## 2018-05-12 MED ORDER — IOPAMIDOL (ISOVUE-300) INJECTION 61%
INTRAVENOUS | Status: AC
  Administered 2018-05-12: 20 mL
  Filled 2018-05-12: qty 50

## 2018-05-12 MED ORDER — IOPAMIDOL (ISOVUE-300) INJECTION 61%
50.0000 mL | Freq: Once | INTRAVENOUS | Status: AC | PRN
  Administered 2018-05-12: 20 mL

## 2018-05-12 MED ORDER — LIDOCAINE HCL 1 % IJ SOLN
INTRAMUSCULAR | Status: AC
  Filled 2018-05-12: qty 20

## 2018-05-16 ENCOUNTER — Ambulatory Visit (INDEPENDENT_AMBULATORY_CARE_PROVIDER_SITE_OTHER): Payer: BLUE CROSS/BLUE SHIELD | Admitting: Dietician

## 2018-05-16 ENCOUNTER — Encounter (INDEPENDENT_AMBULATORY_CARE_PROVIDER_SITE_OTHER): Payer: Self-pay | Admitting: Dietician

## 2018-05-16 VITALS — Ht 62.0 in | Wt 197.0 lb

## 2018-05-16 DIAGNOSIS — E1121 Type 2 diabetes mellitus with diabetic nephropathy: Secondary | ICD-10-CM | POA: Diagnosis not present

## 2018-05-16 DIAGNOSIS — Z794 Long term (current) use of insulin: Secondary | ICD-10-CM

## 2018-05-16 DIAGNOSIS — Z6836 Body mass index (BMI) 36.0-36.9, adult: Secondary | ICD-10-CM

## 2018-05-16 DIAGNOSIS — Z9189 Other specified personal risk factors, not elsewhere classified: Secondary | ICD-10-CM

## 2018-05-16 NOTE — Progress Notes (Signed)
  Office: [phone]  /  Fax: [phone]     [name] has a diagnosis of diabetes type II which puts her at a higher than average risk for heart disease. [name] Kitchen [name] states BGs consistently in an acceptable range and denies any hypoglycemic episodes. Last A1c was Hemoglobin A1C Latest Ref Rng & Units 09/01/2017  HGBA1C 4.8 - 5.6 % 7.9(H)  Some recent data might be hidden   [name] is here today for CVD risk counseling which includes her obesity treatment plan. She states her doctors at [facility] instructed her to lose weight in order to undergo surgery for her cancer. 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 She states her fluid intake is good, but she also receives IV fluids from her home health care 3 days per week.   She reports she is following her meal plan 90% of the time. [name] is on a journaling meal plan with goals of 1200-1400 calories and 60-80 g protein per day. Her weight today is 197 lbs, a 1 lb weight loss since her last visit and a total weight loss of 11 lbs since beginning treatment with [place].     She has been working on intensive lifestyle modifications for  loss to and to  help control her blood glucose levels. Reviewed her food record. She is meeting her nutrition goals most days. Reviewed incorporating lean proteins and decreasing simple carbohydrates to manage blood sugars and continue her weight loss.  [name] is on the following meal plan journaling using the My Fitness Pal app with goals of 1200-1400 calories and 60-80 g protein.  Her meal plan was individualized for maximum benefit.  Also discussed at length the following behavioral modifications to help maximize success: increasing lean protein intake, decreasing simple carbohydrates,  keeping a strict food journal.    [place]  Today's visit  was # 4   Starting weight: 208 Starting date: 04/05/18 Today's weight : Weight: 197 lb (89.4 kg)  Today's date: 05/16/2018 Total lbs lost to date: 11 lbs   ASK: We discussed the diagnosis of obesity with [name] today and [name] agreed to give [place] permission to discuss obesity behavioral modification therapy today.  ASSESS: [name] has the diagnosis of obesity and her BMI today is [name] is in the action stage of change   ADVISE: [name] was educated on the multiple health risks of obesity as well as the benefit of weight loss to improve her health. She was advised of the need for long term treatment and the importance of lifestyle modifications to improve her current health and to decrease her risk of future health problems.  AGREE: Multiple dietary modification options and treatment options were discussed and  [name] agreed to follow the recommendations documented in the above note.  ARRANGE: [name] was educated on the importance of frequent visits to treat obesity as outlined per CMS and USPSTF guidelines and agreed to schedule her next follow up appointment today.

## 2018-05-17 DIAGNOSIS — T82594D Other mechanical complication of infusion catheter, subsequent encounter: Secondary | ICD-10-CM | POA: Diagnosis not present

## 2018-05-17 DIAGNOSIS — E860 Dehydration: Secondary | ICD-10-CM | POA: Diagnosis not present

## 2018-05-17 DIAGNOSIS — N182 Chronic kidney disease, stage 2 (mild): Secondary | ICD-10-CM | POA: Diagnosis not present

## 2018-05-18 ENCOUNTER — Telehealth: Payer: Self-pay | Admitting: Oncology

## 2018-05-18 NOTE — Telephone Encounter (Signed)
Talked with [name] about her rectal bleeding and pain.  She said the rectal bleeding has been increasing and the pain is worse with standing.  She feels like she needs to bear down with standing.  She also has a chronic UTI and has bladder pressure and spasms.  She is currently using a fentanyl patch (50 mcg) for pain and hydrocodone for breakthrough. She is waiting for pelvic exteneration surgery at [facility] but needs to bring her BMI down to 30 before it will be scheduled. Asked if she has a GI doctor that she can call about the rectal bleeding.  She said she has seen Dr. Cristina Gong in the past but does not think she can get in right away.  She said she has an appointment with her PCP on Wednesday.  Advised her to call her PCP to address her pain medication to see if it can be increased.  Also advised her that she may need to go the ER due to her rectal bleeding.  Discussed situation with Joylene John, NP who agreed with patient going to the ER.  Notnamed said she will discuss this with her husband this evening and will think about going to the ER.

## 2018-05-19 ENCOUNTER — Encounter (HOSPITAL_COMMUNITY): Payer: Self-pay | Admitting: *Deleted

## 2018-05-19 ENCOUNTER — Telehealth: Payer: Self-pay | Admitting: *Deleted

## 2018-05-19 ENCOUNTER — Emergency Department (HOSPITAL_COMMUNITY)
Admission: EM | Admit: 2018-05-19 | Discharge: 2018-05-19 | Disposition: A | Discharge status: Home / Self Care | Payer: BLUE CROSS/BLUE SHIELD | Attending: Emergency Medicine | Admitting: Emergency Medicine

## 2018-05-19 DIAGNOSIS — K625 Hemorrhage of anus and rectum: Secondary | ICD-10-CM | POA: Insufficient documentation

## 2018-05-19 DIAGNOSIS — J302 Other seasonal allergic rhinitis: Secondary | ICD-10-CM | POA: Diagnosis not present

## 2018-05-19 DIAGNOSIS — E039 Hypothyroidism, unspecified: Secondary | ICD-10-CM | POA: Diagnosis not present

## 2018-05-19 DIAGNOSIS — I129 Hypertensive chronic kidney disease with stage 1 through stage 4 chronic kidney disease, or unspecified chronic kidney disease: Secondary | ICD-10-CM | POA: Diagnosis not present

## 2018-05-19 DIAGNOSIS — R1030 Lower abdominal pain, unspecified: Secondary | ICD-10-CM | POA: Diagnosis not present

## 2018-05-19 DIAGNOSIS — T83512A Infection and inflammatory reaction due to nephrostomy catheter, initial encounter: Secondary | ICD-10-CM | POA: Diagnosis not present

## 2018-05-19 DIAGNOSIS — Y829 Unspecified medical devices associated with adverse incidents: Secondary | ICD-10-CM | POA: Diagnosis not present

## 2018-05-19 DIAGNOSIS — Z79899 Other long term (current) drug therapy: Secondary | ICD-10-CM | POA: Insufficient documentation

## 2018-05-19 DIAGNOSIS — G8928 Other chronic postprocedural pain: Secondary | ICD-10-CM

## 2018-05-19 DIAGNOSIS — N183 Chronic kidney disease, stage 3 (moderate): Secondary | ICD-10-CM | POA: Insufficient documentation

## 2018-05-19 DIAGNOSIS — N390 Urinary tract infection, site not specified: Secondary | ICD-10-CM

## 2018-05-19 LAB — TYPE AND SCREEN
ABO/RH(D): A POS
Antibody Screen: NEGATIVE

## 2018-05-19 LAB — URINALYSIS, MICROSCOPIC (REFLEX): WBC, UA: >50 WBC/hpf (ref 0–5)

## 2018-05-19 LAB — URINALYSIS, ROUTINE W REFLEX MICROSCOPIC
Bilirubin Urine: NEGATIVE
GLUCOSE, UA: NEGATIVE mg/dL
KETONES UR: NEGATIVE mg/dL
Nitrite: POSITIVE — AB
PROTEIN: 100 mg/dL — AB
Specific Gravity, Urine: 1.015 (ref 1.005–1.030)
pH: 6 (ref 5.0–8.0)

## 2018-05-19 LAB — CBC WITH DIFFERENTIAL/PLATELET
ABS IMMATURE GRANULOCYTES: 0.04 10*3/uL (ref 0.00–0.07)
Basophils Absolute: 0.1 10*3/uL (ref 0.0–0.1)
Basophils Relative: 1 %
EOS PCT: 1 %
Eosinophils Absolute: 0.1 10*3/uL (ref 0.0–0.5)
HCT: 35.1 % — ABNORMAL LOW (ref 36.0–46.0)
HEMOGLOBIN: 10.4 g/dL — AB (ref 12.0–15.0)
Immature Granulocytes: 1 %
Lymphocytes Relative: 15 %
Lymphs Abs: 0.7 10*3/uL (ref 0.7–4.0)
MCH: 27.5 pg (ref 26.0–34.0)
MCHC: 29.6 g/dL — ABNORMAL LOW (ref 30.0–36.0)
MCV: 92.9 fL (ref 80.0–100.0)
MONO ABS: 0.5 10*3/uL (ref 0.1–1.0)
MONOS PCT: 11 %
NEUTROS ABS: 3.1 10*3/uL (ref 1.7–7.7)
Neutrophils Relative %: 71 %
PLATELETS: 350 10*3/uL (ref 150–400)
RBC: 3.78 MIL/uL — ABNORMAL LOW (ref 3.87–5.11)
RDW: 14.7 % (ref 11.5–15.5)
WBC: 4.4 10*3/uL (ref 4.0–10.5)
nRBC: 0 % (ref 0.0–0.2)

## 2018-05-19 LAB — COMPREHENSIVE METABOLIC PANEL
ALBUMIN: 2.8 g/dL — AB (ref 3.5–5.0)
ALT: 54 U/L — AB (ref 0–44)
AST: 30 U/L (ref 15–41)
Alkaline Phosphatase: 364 U/L — ABNORMAL HIGH (ref 38–126)
Anion gap: 9 (ref 5–15)
BILIRUBIN TOTAL: 0.4 mg/dL (ref 0.3–1.2)
BUN: 38 mg/dL — AB (ref 8–23)
CHLORIDE: 104 mmol/L (ref 98–111)
CO2: 22 mmol/L (ref 22–32)
CREATININE: 1.9 mg/dL — AB (ref 0.44–1.00)
Calcium: 9.6 mg/dL (ref 8.9–10.3)
GFR calc Af Amer: 31 mL/min — ABNORMAL LOW (ref >60)
GFR, EST NON AFRICAN AMERICAN: 27 mL/min — AB (ref >60)
GLUCOSE: 183 mg/dL — AB (ref 70–99)
POTASSIUM: 4.3 mmol/L (ref 3.5–5.1)
Sodium: 135 mmol/L (ref 135–145)
TOTAL PROTEIN: 8.5 g/dL — AB (ref 6.5–8.1)

## 2018-05-19 LAB — I-STAT CG4 LACTIC ACID, ED: LACTIC ACID, VENOUS: 2.12 mmol/L — AB (ref 0.5–1.9)

## 2018-05-19 LAB — MAGNESIUM: MAGNESIUM: 1.8 mg/dL (ref 1.7–2.4)

## 2018-05-19 MED ORDER — HYDROCODONE-ACETAMINOPHEN 5-325 MG PO TABS: 1.0000 | ORAL_TABLET | Freq: Four times a day (QID) | ORAL | 0 refills | Status: DC | PRN

## 2018-05-19 MED ORDER — HEPARIN SOD (PORK) LOCK FLUSH 100 UNIT/ML IV SOLN
250.0000 [IU] | Freq: Every day | INTRAVENOUS | Status: DC
  Administered 2018-05-19: 500 [IU]

## 2018-05-19 MED ORDER — SODIUM CHLORIDE 0.9 % IV BOLUS
1000.0000 mL | Freq: Once | INTRAVENOUS | Status: AC
  Administered 2018-05-19: 1000 mL via INTRAVENOUS

## 2018-05-19 MED ORDER — HYDROMORPHONE HCL 1 MG/ML IJ SOLN
1.0000 mg | Freq: Once | INTRAMUSCULAR | Status: AC
  Administered 2018-05-19: 1 mg via INTRAVENOUS
  Filled 2018-05-19: qty 1

## 2018-05-19 MED ORDER — ONDANSETRON HCL 4 MG/2ML IJ SOLN
4.0000 mg | Freq: Once | INTRAMUSCULAR | Status: AC
  Administered 2018-05-19: 4 mg via INTRAVENOUS
  Filled 2018-05-19: qty 2

## 2018-05-19 MED ORDER — FOSFOMYCIN TROMETHAMINE 3 G PO PACK: 3.0000 g | PACK | ORAL | 0 refills | Status: DC

## 2018-05-19 MED ORDER — HEPARIN SOD (PORK) LOCK FLUSH 100 UNIT/ML IV SOLN
250.0000 [IU] | INTRAVENOUS | Status: AC | PRN
  Administered 2018-05-19: 500 [IU]

## 2018-05-19 NOTE — Discharge Instructions (Signed)
Please read and follow all provided instructions.  Your diagnoses today include:  1. Urinary tract infection associated with nephrostomy catheter, initial encounter ([place])   2. Lower abdominal pain   3. Rectal bleeding     Tests performed today include:  Urine test - suggests that you have an infection in your bladder  Urine culture - pending  Blood counts and electrolytes - normal white blood cell count, mild but stable anemia, elevated but baseline creatinine and BUN  Liver function -- elevated alk phosphate, higher than in August  Vital signs. See below for your results today.   Medications prescribed:   Fosfomycin - antibiotic for urinary tract infection  Home care instructions:  Follow any educational materials contained in this packet.  Follow-up instructions: Please follow-up with your primary care provider in 3 days to have your urine culture followed up and to recheck your symptoms.  Please follow-up with your gastroenterologist for further evaluation of the rectal bleeding.  Return instructions:   Please return to the Emergency Department if you experience worsening symptoms.   Return with worsening rectal bleeding especially if you have symptoms of anemia including shortness of breath, lightheadedness, passing out, or chest pain.  Return if you develop a fever.  Return with fever, worsening pain, persistent vomiting, worsening pain in your back.   Please return if you have any other emergent concerns.  Additional Information:  Your vital signs today were: BP (!) 124/59 (BP Location: Right Arm)    Pulse 80    Temp 97.9 F (36.6 C) (Oral)    Resp 17    SpO2 99%  If your blood pressure (BP) was elevated above 135/85 this visit, please have this repeated by your doctor within one month. --------------

## 2018-05-19 NOTE — ED Notes (Signed)
D/c reviewed with patient and spouse 

## 2018-05-19 NOTE — ED Provider Notes (Signed)
[facility] EMERGENCY DEPARTMENT Provider Note   CSN: [id number] Arrival date & time: 05/19/18  1008     History   Chief Complaint Chief Complaint  Patient presents with  . Abdominal Pain  . Rectal Bleeding    HPI [name] is a 62 y.o. female.  Patient who is a retired Marine scientist with a very complex past medical history including multiple abdominal surgeries for cancer, subsequent colostomy/ileostomy, development of a rectovesicular fistula, bilateral nephrostomy tubes with chronic UTI on prophylactic Macrobid, anemia --presents with pain in her lower abdomen she relates to bladder spasms.  Patient is currently on fentanyl patch and hydrocodone for breakthrough pain.  In addition, patient has been having progressively worsening rectal bleeding.  When she bears down she has several drops of bright red blood come from her rectum.  She notes clots at times.  Other times she has some non-bloody drainage.  She does not pass stool through her rectum.  She denies any fever, chest pain, shortness of breath.  No vomiting.  Symptoms are uncontrolled with home medications.  She is followed by urology at [facility].  She has seen Dr. Cristina Gong of GI here in the past.  Pain is severe.  She has home health nurse that comes out to her house once a week.  She administers IV fluids to herself on Monday, Wednesday, Friday and receives magnesium on Wednesday.  The onset of this condition was acute. The course is intermittent. Aggravating factors: none. Alleviating factors: none.       Past Medical History:  Diagnosis Date  . Adrenal adenoma, left 02/08/2016   CT: stable benign  . Anemia in neoplastic disease   . Back pain   . Benign essential HTN   . 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 Cancer of  corpus uteri, except isthmus [facility])  oncologist-- dr Denman [name] and dr Alvy Bimler    10-15-2004  dx endometroid endometrial and ovarian cancer s/p  chemotheapy and surgery(TAH w/ BSO) :  recurrent 11-19-2014 post pelvic surgery and radiation 01-29-2015 to 03-10-2015  . Chronic idiopathic neutropenia (HCC)    presumed related to chemotherapy March 2006--- followed by dr Alvy Bimler (treatment w/ G-CSF injections  . Chronic nausea   . Chronic pain    perineal/ anal  area from bladder pad irritates skin , right flank pain  . CKD stage G2/A3, GFR 60-89 and albumin creatinine ratio >300 mg/g    nephrologist-  dr Madelon Lips  . Colovesical fistula   . Diabetic retinopathy, background ([place])   . Difficult intravenous access    small veins--- hx PICC lines  . DM type 2 (diabetes mellitus, type 2) ([place])    monitored by dr Legrand Como altheimer  . Dysuria   . Environmental and seasonal allergies   . Fatty liver 02/08/2016   CT  . Generalized muscle weakness   . GERD (gastroesophageal reflux disease)   . Hiatal hernia   . History of abdominal abscess 04/16/2017   post surgery 04-01-2017  --- resolved 10/ 2018  . History of gastric polyp    2014  duodenum  . History of ileus 04/16/2017   resolved w/ no surgical intervention  . History of radiation therapy    01-29-2015 to 03-10-2015  pelvis 50.4Gy  . Hypothyroidism    monitored by dr  michael altheimer  . IBS (irritable bowel syndrome)   . Ileostomy in place [facility]) 04/01/2017   created at same time colostomy takedown.  . Joint pain   . Leg edema   . Lower urinary tract symptoms (LUTS)    urge urinary  incontinence  . Mixed dyslipidemia   . Multiple thyroid nodules    Managed by Dr. Harlow Asa  . Nephrostomy status ([facility])   . Palpitations   . Pelvic abscess in female 04/16/2017  . PONV (postoperative nausea and vomiting)    "scopolamine patch works for me"  . Radiation-induced dermatitis    contact dermatitis , radiation completed, rash only on ankles  now.  . Seasonal allergies   . Ureteral stricture, right UROLOGIT-  DR [facility]   CHRONIC--  TREATMENT URETERAL STENT  . 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 Vitamin D deficiency   . Wears glasses     Patient Active Problem List   Diagnosis Date Noted  . Recurrent UTI 03/08/2018  . Acute pyelonephritis 11/29/2017  . HLD (hyperlipidemia) 11/29/2017  . GERD (gastroesophageal reflux disease) 11/29/2017  . Type II diabetes mellitus with renal manifestations ([place]) 11/29/2017  . Encounter for palliative care   . Goals of care, counseling/discussion   . Poor venous access   . Acute renal failure superimposed on chronic kidney disease ([place]) 10/02/2017  . Decubitus ulcer of sacral region, stage 2 ([place]) 10/02/2017  . Metabolic acidosis 09/12/1550  . MDD (major depressive disorder), single episode, moderate ([place])   . Poorly controlled diabetes mellitus ([place]) 08/31/2017  . Pressure injury of skin 08/31/2017  . 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 History of external beam radiation therapy to pelvis 08/31/2017  . ARF (acute renal failure) ([place]) 08/30/2017  . Adjustment disorder with mixed anxiety and depressed mood 08/30/2017  . Dehydration 08/29/2017  . CKD (chronic kidney disease), stage III ([place]) 08/24/2017  . Wound of right buttock 08/10/2017  . 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[place]) 06/20/2017  . Protein-calorie malnutrition, moderate ([place]) 04/18/2017  . Postoperative anemia 04/04/2017  . 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 Hot flashes related to aromatase inhibitor therapy 03/18/2017  . 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 Vitamin D insufficiency 08/30/2016  . Breast cancer of upper-inner quadrant of left female breast ([place])   . Pelvic pain in female 03/19/2015  . Chronic anemia 12/30/2014  .  UTI (urinary tract infection)   . Pancytopenia, acquired ([place]) 11/26/2014  . Morbid obesity ([place]) 11/20/2014  . 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 Abdominal pain 09/09/2014  . Postmenopausal bleeding 09/05/2014  . History of ovarian & endometrial cancer 07/17/2012  . 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[place]) 05/10/2012  . Chronic neutropenia ([place]) 12/14/2011  . Primary hypothyroidism 12/14/2011  . DM type 2 (diabetes mellitus, type 2) ([place]) 12/14/2011  . Benign essential HTN 12/14/2011    Past Surgical History:  Procedure Laterality Date  . APPENDECTOMY    . biopsy thyroid nodules    . BREAST RECONSTRUCTION WITH PLACEMENT OF TISSUE EXPANDER AND FLEX HD (ACELLULAR HYDRATED DERMIS) Bilateral 12/11/2015   Procedure: BILATERAL BREAST RECONSTRUCTION WITH PLACEMENT OF TISSUE EXPANDERS;  Surgeon: Irene Limbo, MD;  Location: [facility];  Service: Plastics;  Laterality: Bilateral;  . COLONOSCOPY WITH PROPOFOL N/A 08/21/2013   Procedure: COLONOSCOPY WITH PROPOFOL;  Surgeon: Cleotis Nipper, MD;  [facility];  Service: Endoscopy;  Laterality: N/A;  . COLOSTOMY TAKEDOWN N/A 12/04/2014   Procedure: LAPROSCOPIC LYSIS OF ADHESIONS, SPLENIC MOBILIZATION, RELOCATION OF COLOSTOMY, DEBRIDEMENT INITIAL COLOSTOMY SITE;  Surgeon: Michael [name], MD;  [facility]: [facility];  Service: General;  Laterality: N/A;  . CYSTOGRAM N/A 06/01/2017   Procedure: CYSTOGRAM;  Surgeon: Alexis Frock, MD;  [facility];  Service: Urology;  Laterality: N/A;  . CYSTOSCOPY W/ RETROGRADES Right 11/21/2015   Procedure: CYSTOSCOPY WITH RETROGRADE PYELOGRAM;  Surgeon: Alexis Frock, MD;  [facility];  Service: Urology;  Laterality: Right;  . CYSTOSCOPY W/ URETERAL STENT PLACEMENT Right 11/21/2015   Procedure: CYSTOSCOPY WITH STENT REPLACEMENT;  Surgeon: Alexis Frock, MD;  [facility];  Service: Urology;  Laterality: Right;  .  CYSTOSCOPY W/ URETERAL STENT PLACEMENT Right 03/10/2016   Procedure: CYSTOSCOPY WITH STENT REPLACEMENT;  Surgeon: Alexis Frock, MD;  Location: [facility];  Service: Urology;  Laterality: Right;  . CYSTOSCOPY W/ URETERAL STENT PLACEMENT Right 06/30/2016   Procedure: CYSTOSCOPY WITH RETROGRADE PYELOGRAM/URETERAL STENT EXCHANGE;  Surgeon: Alexis Frock, MD;  Location: [facility];  Service: Urology;  Laterality: Right;  . CYSTOSCOPY W/ URETERAL STENT PLACEMENT N/A 06/01/2017   Procedure: CYSTOSCOPY WITH EXAM UNDER ANESTHESIA;  Surgeon: Alexis Frock, MD;  [facility];  Service: Urology;  Laterality: N/A;  . CYSTOSCOPY W/ URETERAL STENT PLACEMENT Right 08/17/2017   Procedure: CYSTOSCOPY WITH RETROGRADE PYELOGRAM/URETERAL STENT REMOVAL;  Surgeon: Alexis Frock, MD;  Location: [facility];  Service: Urology;  Laterality: Right;  . CYSTOSCOPY WITH RETROGRADE PYELOGRAM, URETEROSCOPY AND STENT PLACEMENT Right 03/20/2015   Procedure: CYSTOSCOPY WITH RETROGRADE PYELOGRAM, URETEROSCOPY WITH BALLOON DILATION AND STENT PLACEMENT ON RIGHT;  Surgeon: Alexis Frock, MD;  Location: [facility];  Service: Urology;  Laterality: Right;  . CYSTOSCOPY WITH RETROGRADE PYELOGRAM, URETEROSCOPY AND STENT PLACEMENT Right 05/02/2015   Procedure: CYSTOSCOPY WITH RIGHT RETROGRADE PYELOGRAM,  DIAGNOSTIC URETEROSCOPY AND STENT PULL ;  Surgeon: Alexis Frock, MD;  Location: [facility];  Service: Urology;  Laterality: Right;  . CYSTOSCOPY WITH RETROGRADE PYELOGRAM, URETEROSCOPY AND STENT PLACEMENT Right 09/05/2015   Procedure: CYSTOSCOPY WITH RETROGRADE PYELOGRAM,  AND STENT PLACEMENT;  Surgeon: Alexis Frock, MD;  [facility];  Service: Urology;  Laterality: Right;  . CYSTOSCOPY WITH RETROGRADE PYELOGRAM, URETEROSCOPY AND STENT PLACEMENT Right 04/01/2017   Procedure: CYSTOSCOPY WITH RETROGRADE PYELOGRAM, URETEROSCOPY AND STENT PLACEMENT;  Surgeon:  Alexis Frock, MD;  [facility];  Service: Urology;  Laterality: Right;  . CYSTOSCOPY WITH STENT PLACEMENT Right 10/27/2016   Procedure: CYSTOSCOPY WITH STENT CHANGE and right retrograde pyelogram;  Surgeon: Alexis Frock, MD;  Location: [facility];  Service: Urology;  Laterality: Right;  . EUS N/A 10/02/2014   Procedure: LOWER ENDOSCOPIC ULTRASOUND (EUS);  Surgeon: Arta Silence, MD;  Location: [facility];  Service: Endoscopy;  Laterality: N/A;  . EXCISION SOFT TISSUE MASS RIGHT FOREMAN  12-08-2006  . EYE SURGERY  as child   pytosis of eyelids repair  . INCISION AND DRAINAGE OF WOUND Bilateral 12/26/2015   Procedure: DEBRIDEMENT OF BILATERAL MASTECTOMY FLAPS;  Surgeon: Irene Limbo, MD;  Location: [facility];  Service: Plastics;  Laterality: Bilateral;  . IR CV LINE INJECTION  05/31/2017  . IR FLUORO GUIDE CV LINE LEFT  05/31/2017  . IR FLUORO GUIDE CV LINE RIGHT  04/06/2017  . IR FLUORO GUIDE CV MIDLINE PICC RIGHT  05/30/2017  . IR NEPHROSTOGRAM LEFT INITIAL PLACEMENT  09/02/2017  .  IR NEPHROSTOGRAM LEFT THRU EXISTING ACCESS  11/29/2017  . IR NEPHROSTOGRAM RIGHT INITIAL PLACEMENT  09/02/2017  . IR NEPHROSTOGRAM RIGHT THRU EXISTING ACCESS  09/13/2017  . IR NEPHROSTOGRAM RIGHT THRU EXISTING ACCESS  11/29/2017  . IR NEPHROSTOMY EXCHANGE LEFT  11/28/2017  . IR NEPHROSTOMY EXCHANGE LEFT  01/05/2018  . IR NEPHROSTOMY EXCHANGE LEFT  02/16/2018  . IR NEPHROSTOMY EXCHANGE LEFT  03/30/2018  . IR NEPHROSTOMY EXCHANGE LEFT  05/12/2018  . IR NEPHROSTOMY EXCHANGE RIGHT  10/02/2017  . IR NEPHROSTOMY EXCHANGE RIGHT  11/28/2017  . IR NEPHROSTOMY EXCHANGE RIGHT  01/05/2018  . IR NEPHROSTOMY EXCHANGE RIGHT  02/16/2018  . IR NEPHROSTOMY EXCHANGE RIGHT  03/30/2018  . IR NEPHROSTOMY EXCHANGE RIGHT  05/12/2018  . IR NEPHROSTOMY PLACEMENT LEFT  10/02/2017  . IR RADIOLOGIST EVAL & MGMT  05/03/2017  . IR US GUIDE VASC ACCESS LEFT  05/31/2017  . IR US GUIDE VASC ACCESS RIGHT  04/06/2017  .  IR US GUIDE VASC ACCESS RIGHT  05/30/2017  . LAPAROSCOPIC CHOLECYSTECTOMY  1990  . LIPOSUCTION WITH LIPOFILLING Bilateral 04/16/2016   Procedure: LIPOSUCTION WITH LIPOFILLING TO BILATERAL CHEST;  Surgeon: Irene Limbo, MD;  Location: [facility];  Service: Plastics;  Laterality: Bilateral;  . MASTECTOMY W/ SENTINEL NODE BIOPSY Bilateral 12/11/2015   Procedure: RIGHT PROPHYLACTIC MASTECTOMY, LEFT TOTAL MASTECTOMY WITH LEFT AXILLARY SENTINEL LYMPH NODE BIOPSY;  Surgeon: Stark Klein, MD;  Location: [facility];  Service: General;  Laterality: Bilateral;  . OSTOMY N/A 11/19/2014   Procedure: OSTOMY;  Surgeon: Michael [name], MD;  [facility];  Service: General;  Laterality: N/A;  . PROCTOSCOPY N/A 04/01/2017   Procedure: RIDGE PROCTOSCOPY;  Surgeon: Michael [name], MD;  [facility];  Service: General;  Laterality: N/A;  . REMOVAL OF BILATERAL TISSUE EXPANDERS WITH PLACEMENT OF BILATERAL BREAST IMPLANTS Bilateral 04/16/2016   Procedure: REMOVAL OF BILATERAL TISSUE EXPANDERS WITH PLACEMENT OF BILATERAL BREAST IMPLANTS;  Surgeon: Irene Limbo, MD;  Location: [facility];  Service: Plastics;  Laterality: Bilateral;  . ROBOTIC ASSISTED LAP VAGINAL HYSTERECTOMY N/A 11/19/2014   Procedure: ROBOTIC LYSIS OF ADHESIONS, CONVERTED TO LAPAROTOMY RADICAL UPPER VAGINECTOMY,LOW ANTERIOR BOWEL RESECTION, COLOSTOMY, BILATERAL URETERAL STENT PLACEMENT AND CYSTONOMY CLOSURE;  Surgeon: Everitt Amber, MD;  [facility];  Service: Gynecology;  Laterality: N/A;  . TISSUE EXPANDER FILLING Bilateral 12/26/2015   Procedure: EXPANSION OF BILATERAL CHEST TISSUE EXPANDERS (60 mL- Right; 75 mL- Left);  Surgeon: Irene Limbo, MD;  Location: [facility];  Service: Plastics;  Laterality: Bilateral;  . TONSILLECTOMY    . TOTAL ABDOMINAL HYSTERECTOMY  March 2006   [facility]   and Bilateral Salpingoophorectomy/  staging for Ovarian cancer/  an  . XI ROBOTIC ASSISTED LOWER ANTERIOR  RESECTION N/A 04/01/2017   Procedure: XI ROBOTIC VS LAPAROSCOPIC COLOSTOMY TAKEDOWN WITH LYSIS OF ADHESIONS.;  Surgeon: Michael [name], MD;  [facility];  Service: General;  Laterality: N/A;  ERAS PATHWAY     OB History    Gravida  1   Para      Term      Preterm      AB      Living        SAB      TAB      Ectopic      Multiple      Live Births               Home Medications    Prior to Admission medications  Medication Sig Start Date End Date Taking? Authorizing Provider  acetaminophen (TYLENOL) 325 MG tablet Take 2 tablets (650 mg total) by mouth every 6 (six) hours as needed. 09/24/17   Roxan Hockey, MD  anastrozole (ARIMIDEX) 1 MG tablet TAKE 1 TABLET DAILY 01/30/18   Heath Lark, MD  Biotin 5 MG TABS Take 5 mg by mouth every morning.     [provider]  Cholecalciferol (VITAMIN D3) 10000 UNITS capsule Take 10,000 Units by mouth once a week. Sunday evening's    [provider]  clobetasol (OLUX) 0.05 % topical foam Apply topically 2 (two) times daily.    [provider]  collagenase (SANTYL) ointment Apply 1 application topically daily as needed (skin care).     [provider]  diphenhydrAMINE (BENADRYL) 25 mg capsule Take 1 capsule (25 mg total) by mouth every 8 (eight) hours as needed for itching, allergies or sleep. 09/24/17   Roxan Hockey, MD  diphenoxylate-atropine (LOMOTIL) 2.5-0.025 MG tablet Take 1 tablet by mouth 4 (four) times daily as needed for diarrhea or loose stools. TO PREVENT LOOSE BOWEL MOVEMENTS Patient taking differently: Take 1 tablet by mouth every other day. TO PREVENT LOOSE BOWEL MOVEMENTS 09/24/17   Roxan Hockey, MD  fentaNYL (DURAGESIC) 50 MCG/HR Place 1 patch (50 mcg total) onto the skin every 3 (three) days. 04/20/18   [name] R, DO  ferrous sulfate 325 (65 FE) MG tablet Take 1 tablet (325 mg total) by mouth at bedtime. 09/24/17   Roxan Hockey, MD  HYDROcodone-acetaminophen  (NORCO/VICODIN) 5-325 MG tablet Take 1 tablet by mouth every 6 (six) hours as needed for moderate pain. 02/21/18   [name] R, DO  insulin glargine (LANTUS) 100 UNIT/ML injection Inject 8 Units into the skin 2 (two) times daily.     [provider]  JANUVIA 50 MG tablet  10/28/17   [provider]  levothyroxine (SYNTHROID, LEVOTHROID) 150 MCG tablet Take 1 tablet (150 mcg total) by mouth daily before breakfast. 09/25/17   Roxan Hockey, MD  loperamide (IMODIUM) 2 MG capsule Take 1-2 capsules (2-4 mg total) by mouth every 8 (eight) hours as needed for diarrhea or loose stools (Use if >2 BM every 8 hours). 09/24/17   Roxan Hockey, MD  loratadine (CLARITIN) 10 MG tablet Take 10 mg by mouth every morning.     [provider]  LYRICA 50 MG capsule TAKE 1 CAPSULE(50 MG) BY MOUTH TWICE DAILY 04/17/18   Carollee Herter, Alferd Apa, DO  nitrofurantoin, macrocrystal-monohydrate, (MACROBID) 100 MG capsule Take 100 mg by mouth at bedtime.    [provider]  omega-3 acid ethyl esters (LOVAZA) 1 G capsule Take 1 g by mouth 2 (two) times daily.     [provider]  Polyethyl Glycol-Propyl Glycol (SYSTANE OP) Place 1 drop into both eyes daily as needed (dry eyes).     [provider]  Prenatal Vit-Fe Fumarate-FA (PRENATAL VITAMIN PO) Take 1 capsule by mouth daily. Takes prenatal because there are no dyes in it    [provider]  protein supplement shake (PREMIER PROTEIN) LIQD Take 325 mLs (11 oz total) by mouth 2 (two) times daily as needed (Pt to request). 09/24/17   Roxan Hockey, MD  ranitidine (ZANTAC) 150 MG tablet Take 1 tablet (150 mg total) by mouth 2 (two) times daily as needed for heartburn. Patient taking differently: Take 150 mg by mouth at bedtime.  09/24/17   Roxan Hockey, MD  rosuvastatin (CRESTOR) 10  MG tablet Take 10 mg by mouth every evening.     [provider]  Saccharomyces boulardii (FLORASTOR PO) Take 1  capsule by mouth daily. 02/16/18   [provider]  sodium bicarbonate 650 MG tablet Take 650 mg by mouth 2 (two) times daily.    [provider]  sodium chloride 0.9 % injection Inject 10 mLs into the vein daily.    [provider]  Tbo-Filgrastim (GRANIX) 480 MCG/0.8ML SOSY injection Inject 0.8 mLs (480 mcg total) into the skin daily. 11/28/17   Heath Lark, MD    Family History Family History  Problem Relation Age of Onset  . Cancer Mother 72       stomach ca  . Hypertension Mother   . Cancer Father 25       prostate ca  . Diabetes Father   . Heart disease Father        CABG  . Hypertension Father   . Hyperlipidemia Father   . Obesity Father   . Breast cancer Maternal Aunt        dx in her 6s  . Lymphoma Paternal Aunt   . Brain cancer Paternal Grandfather   . Ovarian cancer Other   . Diabetes Sister   . Hypertension Brother y-10  . Heart disease Brother        CABG  . Diabetes Brother     Social History Social History   Tobacco Use  . Smoking status: Never Smoker  . Smokeless tobacco: Never Used  Substance Use Topics  . Alcohol use: Not Currently  . Drug use: No     Allergies   Penicillins; Cefaclor; Erythromycin; Tape; Trimethoprim; Ultram [tramadol]; Cephalosporins; Fluconazole; Oxycodone; Pectin; Septra [sulfamethoxazole-trimethoprim]; and Sulfa antibiotics   Review of Systems Review of Systems  Constitutional: Negative for fever.  HENT: Negative for rhinorrhea and sore throat.   Eyes: Negative for redness.  Respiratory: Negative for cough.   Cardiovascular: Negative for chest pain.  Gastrointestinal: Positive for abdominal pain, blood in stool and rectal pain. Negative for diarrhea, nausea and vomiting.  Genitourinary: Positive for pelvic pain. Negative for dysuria.  Musculoskeletal: Negative for myalgias.  Skin: Negative for rash.  Neurological: Negative for headaches.     Physical Exam Updated Vital Signs BP 125/73  (BP Location: Left Arm)   Pulse 79   Temp 97.9 F (36.6 C) (Oral)   Resp 16   SpO2 100%   Physical Exam  Constitutional: She appears well-developed and well-nourished.  HENT:  Head: Normocephalic and atraumatic.  Eyes: Conjunctivae are normal. Right eye exhibits no discharge. Left eye exhibits no discharge.  Neck: Normal range of motion. Neck supple.  Cardiovascular: Normal rate, regular rhythm and normal heart sounds.  Pulmonary/Chest: Effort normal and breath sounds normal.  Abdominal: Soft. There is tenderness in the suprapubic area. There is no rebound and no guarding.  Genitourinary:  Genitourinary Comments: No active bleeding through anus. Patient with scarring noted in the gluteal cleft with fissuring noted superior to the anus. There is no active bleeding from this area.   Neurological: She is alert.  Skin: Skin is warm and dry.  Psychiatric: She has a normal mood and affect.  Nursing note and vitals reviewed.    ED Treatments / Results  Labs (all labs ordered are listed, but only abnormal results are displayed) Labs Reviewed  COMPREHENSIVE METABOLIC PANEL - Abnormal; Notable for the following components:      Result Value   Glucose, Bld 183 (*)  BUN 38 (*)    Creatinine, Ser 1.90 (*)    Total Protein 8.5 (*)    Albumin 2.8 (*)    ALT 54 (*)    Alkaline Phosphatase 364 (*)    GFR calc non Af Amer 27 (*)    GFR calc Af Amer 31 (*)    All other components within normal limits  CBC WITH DIFFERENTIAL/PLATELET - Abnormal; Notable for the following components:   RBC 3.78 (*)    Hemoglobin 10.4 (*)    HCT 35.1 (*)    MCHC 29.6 (*)    All other components within normal limits  URINALYSIS, ROUTINE W REFLEX MICROSCOPIC - Abnormal; Notable for the following components:   Hgb urine dipstick LARGE (*)    Protein, ur 100 (*)    Nitrite POSITIVE (*)    Leukocytes, UA LARGE (*)    All other components within normal limits  URINALYSIS, MICROSCOPIC (REFLEX) - Abnormal;  Notable for the following components:   Bacteria, UA MANY (*)    All other components within normal limits  I-STAT CG4 LACTIC ACID, ED - Abnormal; Notable for the following components:   Lactic Acid, Venous 2.12 (*)    All other components within normal limits  URINE CULTURE  MAGNESIUM  POC OCCULT BLOOD, ED  I-STAT CG4 LACTIC ACID, ED  TYPE AND SCREEN    EKG None  Radiology No results found.  Procedures Procedures (including critical care time)  Medications Ordered in ED Medications  HYDROmorphone (DILAUDID) injection 1 mg (1 mg Intravenous Given 05/19/18 1151)  ondansetron (ZOFRAN) injection 4 mg (4 mg Intravenous Given 05/19/18 1151)  sodium chloride 0.9 % bolus 1,000 mL (0 mLs Intravenous Stopped 05/19/18 1608)     Initial Impression / Assessment and Plan / ED Course  I have reviewed the triage vital signs and the nursing notes.  Pertinent labs & imaging results that were available during my care of the patient were reviewed by me and considered in my medical decision making (see chart for details).     Patient seen and examined.   Vital signs reviewed and are as follows: BP 125/73 (BP Location: Left Arm)   Pulse 79   Temp 97.9 F (36.6 C) (Oral)   Resp 16   SpO2 100%   This is a complex patient with several multiple medical problems.  Her main problem is currently lower abdominal pain that seems to be related to bladder spasms and possible urinary infection.  Patient has draining bilateral nephrostomy tubes.  External rectal exam performed with RN chaperone.  Unable to perform DRE due to pain.  No active bleeding noted.  Patient has a large fissure in the gluteal cleft and sequelae of previous radiation therapy and healing bedsores.  Will initiate treatment with pain medication, hydration.  Labs ordered and are pending.  Will discuss with Dr. Dayna Barker.  Work-up complete.  UA with signs of infection including positive nitrite.  Given spectrum of symptoms today, I  feel patient should be treated for this.  Patient is in agreement.  Culture sent.  I offered admission for IV antibiotics and patient states that she would prefer to go home.  She had a Serratia UTI in 12/2017.  With this, she was treated with fosfomycin every other day for 3 days.  Patient is willing to follow-up with her primary care doctor to ensure improvement in symptoms and for culture follow-up.  I feel this is appropriate given her previous improvement on these antibiotics.  I  spoke with Dr. Paulita Fujita of [facility] and discussed rectal bleeding.  No indications for admission from this.  Patient reports that last time she used the restroom in the emergency department, she noted no blood.  She is willing to follow-up as an outpatient with her gastroenterology group.  Overall patient is feeling better.  On reexam, patient is comfortable lying in bed.  She is no longer wincing in pain.  Abdomen is soft and mildly tender generally.  She will be discharged home at this time.  We discussed signs and symptoms to return including worsening pain, fever, worsening bleeding or signs of anemia including lightheadedness, syncope, shortness of breath.  She seems motivated to return if her symptoms worsen.  She is accompanied by her husband who helps care for her.  Final Clinical Impressions(s) / ED Diagnoses   Final diagnoses:  Urinary tract infection associated with nephrostomy catheter, initial encounter [facility])  Lower abdominal pain  Rectal bleeding   Patient with complex medical history as above.  Main complaint today is lower abdominal pain and bladder spasms.  She likely has a urinary tract infection.  No elevated white count.  No fever or tachycardia.  Low concern for sepsis.  Patient does have chronic kidney disease which is at baseline.  She has anemia which is at baseline.  She has had some rectal bleeding which seems to be low volume.  No bleeding noted on my external exam.  She states that bleeding has  ceased during ED visit.  Hemoglobin is stable.  No indications for admission for this as she is asymptomatic and amount is small.  Patient is well connected with multiple doctors directing her care.  She is able to follow-up with her primary care doctor for recheck of her UTI symptoms.  We discussed return instructions as above and she seems reliable to follow-up.    ED Discharge Orders         Ordered    fosfomycin (MONUROL) 3 g PACK  Every 48 hours     05/19/18 1612           Carlisle Cater, PA-C 05/19/18 1627    Mesner, Corene Cornea, MD 05/19/18 1650

## 2018-05-19 NOTE — ED Notes (Addendum)
IV team consulted to assess patient's PICC line for use.

## 2018-05-19 NOTE — Telephone Encounter (Signed)
Received Lab Report results from [facility]; forwarded to provider/SLS 10/18

## 2018-05-19 NOTE — ED Notes (Signed)
Josh G.-PA notified of elevated CG4

## 2018-05-19 NOTE — ED Triage Notes (Addendum)
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

## 2018-05-21 IMAGING — XA DG FLUORO GUIDE CV LINE
1 series · 1 of 1 positions shown · non-contrast
Comparison: none

INDICATION: 61-year-old female with a history of dehydration

[Series 1: fl (-) angio · 1 of 1 slices shown]
[im 1/1]
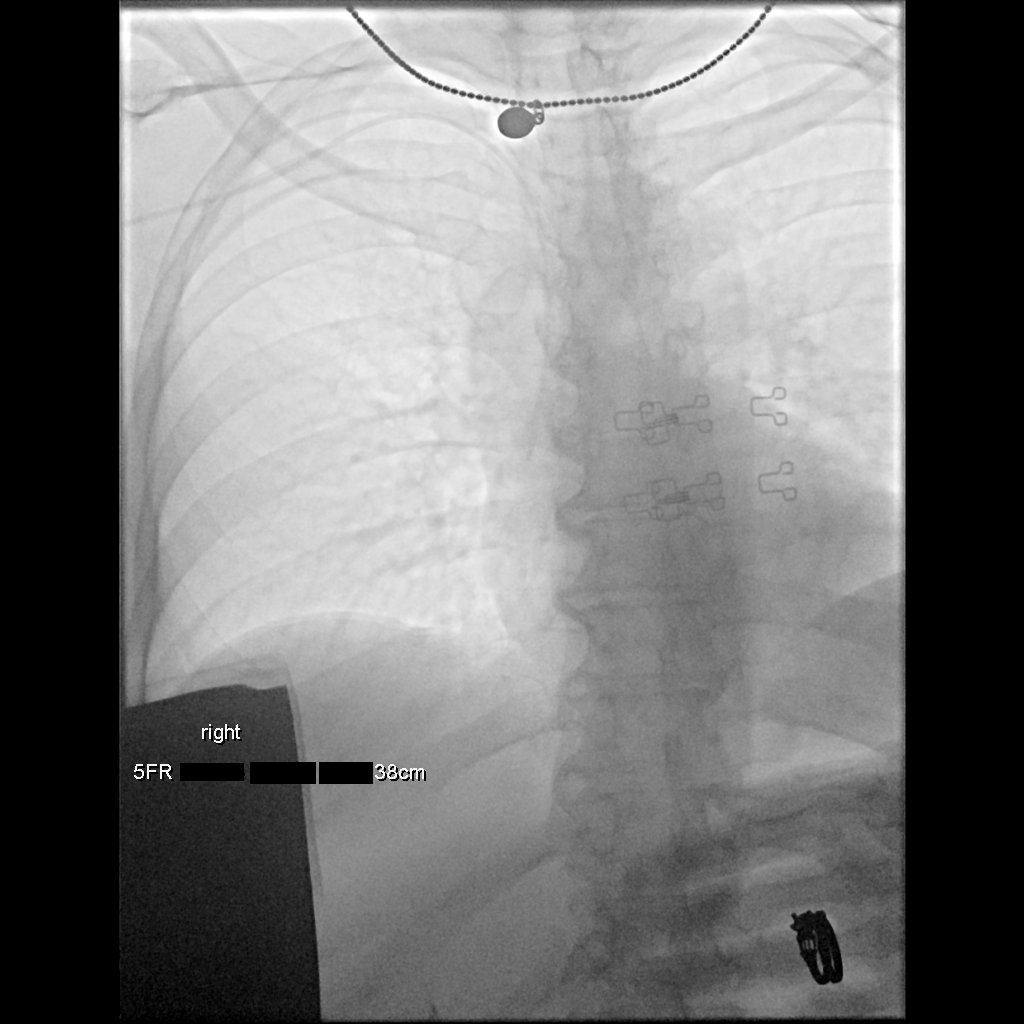

[1 of 1 positions shown; findings below may reference images not displayed]

EXAM:
PICC LINE PLACEMENT WITH ULTRASOUND AND FLUOROSCOPIC GUIDANCE

MEDICATIONS:
None

ANESTHESIA/SEDATION:
None

FLUOROSCOPY TIME:  Fluoroscopy Time: 0 minutes 12 seconds (0.8 mGy).

COMPLICATIONS:
None

PROCEDURE:
Informed written consent was obtained from the patient after a
thorough discussion of the procedural risks, benefits and
alternatives. All questions were addressed. Maximal Sterile Barrier
Technique was utilized including caps, mask, sterile gowns, sterile
gloves, sterile drape, hand hygiene and skin antiseptic. A timeout
was performed prior to the initiation of the procedure.

Patient was position in the supine position on the fluoroscopy table
with the right arm abducted 90 degrees. Ultrasound survey of the
upper extremity was performed with images stored and sent to PACs.

The right brachial vein was selected for access.

Once the patient was prepped and draped in the usual sterile
fashion, the skin and subcutaneous tissues were generously
infiltrated with 1% lidocaine for local anesthesia.

A micropuncture access kit was then used to access the targeted
vein. Wire was passed centrally, confirmed to be within the venous
system under fluoroscopy. A small stab incision was made with an 11
blade scalpel and the sheath was then placed over the wire.
Estimated length of the catheter was then performed with the
indwelling wire.

Catheter was amputated at 38 cm length and placed with coaxial wire
through the peel-away.

Double-lumen, power injectable PICC in the basilic vein. Tip
confirmed at the cavoatrial junction, and the catheter is ready for
use.

Stat lock was placed.

Patient tolerated the procedure well and remained hemodynamically
stable throughout.

No complications were encountered and no significant blood loss was
encountered.
IMPRESSION: Status post right brachial double-lumen power injectable PICC.
Catheter ready for use.

## 2018-05-22 LAB — URINE CULTURE: Culture: >=100000 — AB

## 2018-05-22 IMAGING — XA IR CENTRAL VENOUS CATHETER
2 series · 5 of 5 positions shown · IV contrast (agent unspecified)
Comparison: none

INDICATION: Right arm pain and hematoma following recent right PICC line. Home
health nursing is unable to use the PICC line secondary to increased
vascular pressures and back bleeding with infusions

EXAM:
RIGHT UPPER EXTREMITY PICC LINE CATHETER INJECTION AND REMOVAL.
ULTRASOUND AND FLUOROSCOPIC GUIDED LEFT UPPER EXTREMITY PICC LINE
INSERTION
MEDICATIONS:
1% lidocaine local
CONTRAST:  10 cc
FLUOROSCOPY TIME:  30.0 seconds (13 mGy)
COMPLICATIONS:
None immediate.
TECHNIQUE: The procedure, risks, benefits, and alternatives were explained to
the patient and informed written consent was obtained. A timeout was
performed prior to the initiation of the procedure.

[Series 1: body 4 care · 4 of 27 frames shown]
[frame 5/27]
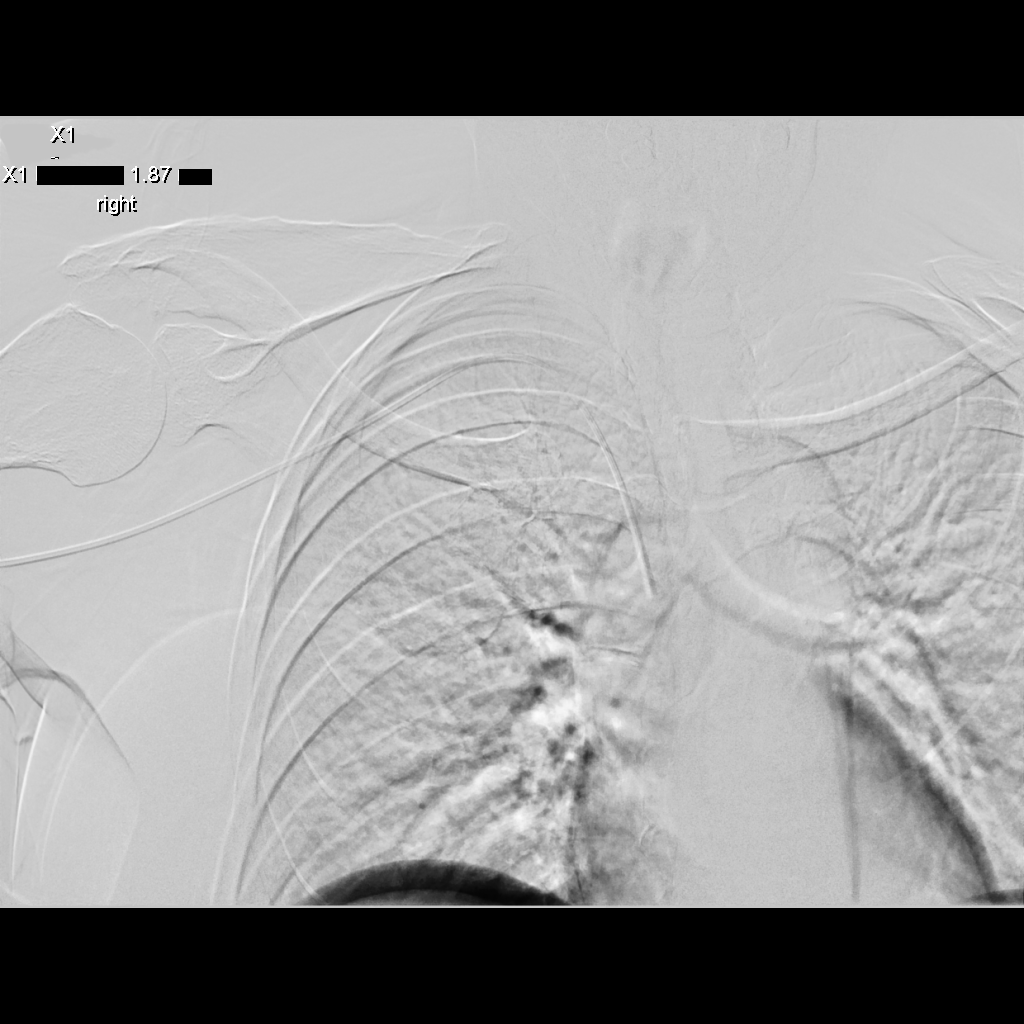
[frame 14/27]
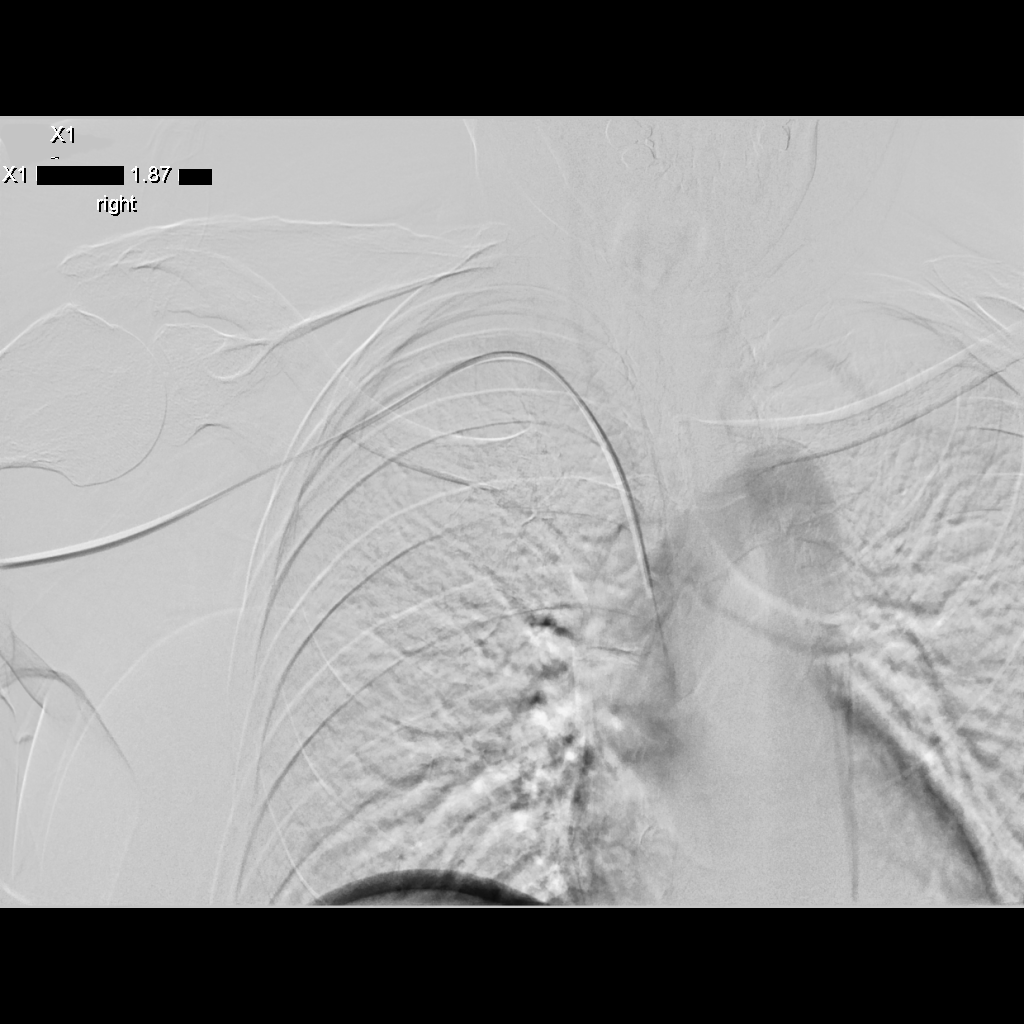
[frame 23/27]
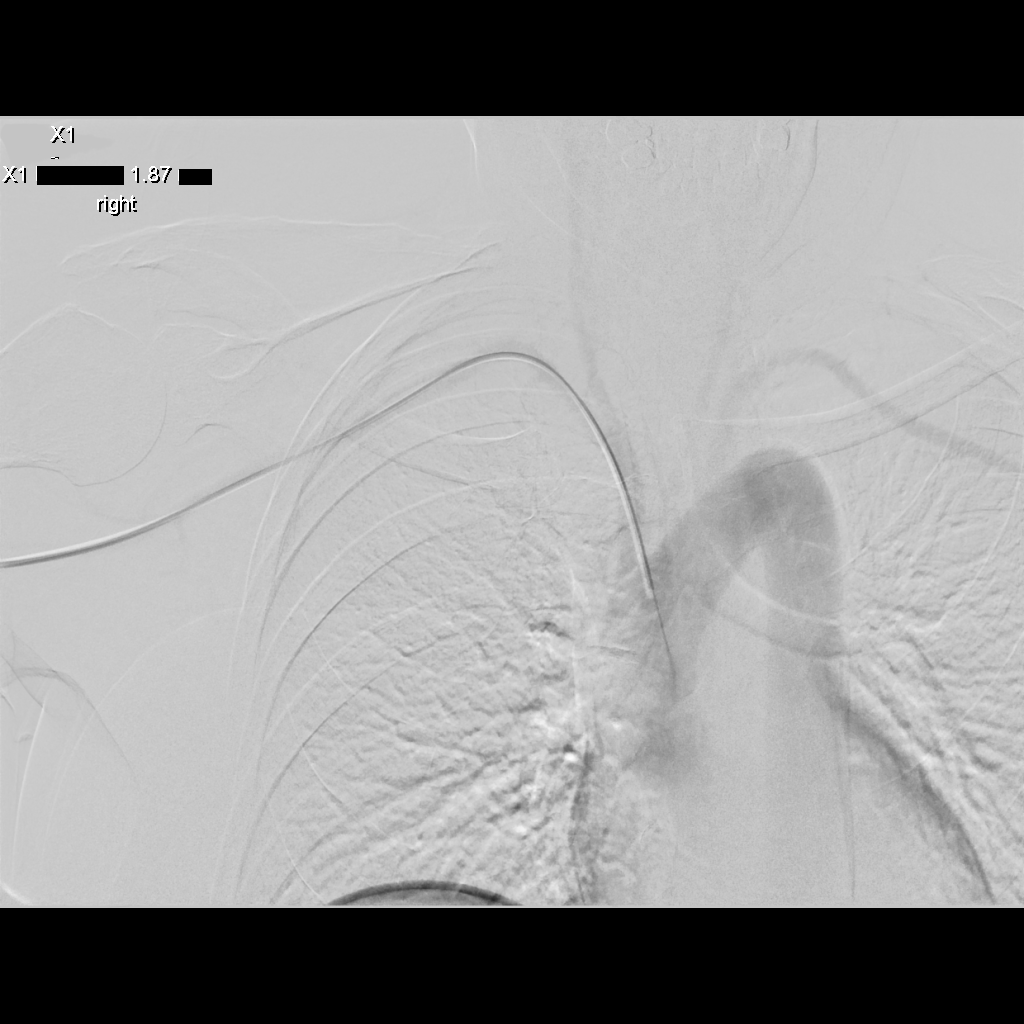
[frame 27/27]
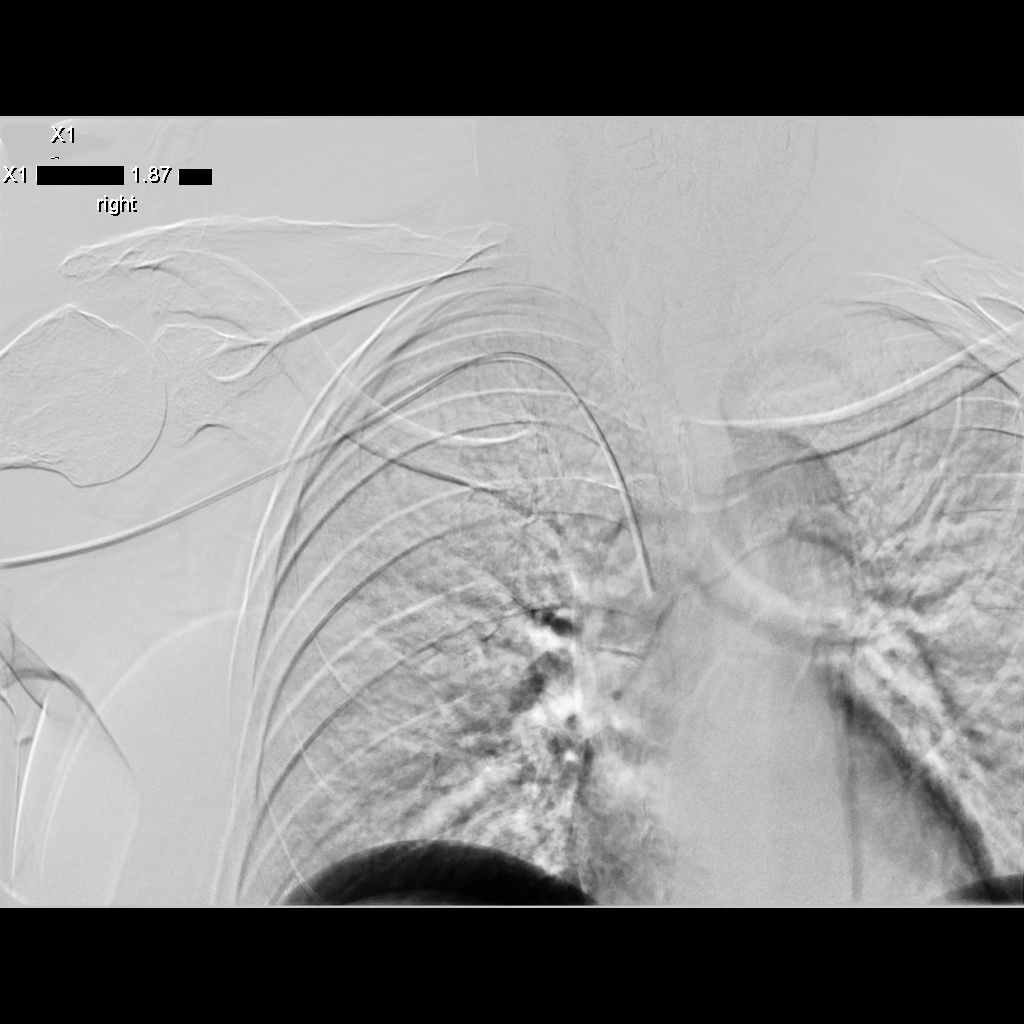

[Series 2: fl (-) angio · 1 of 1 slices shown]
[im 1/1]
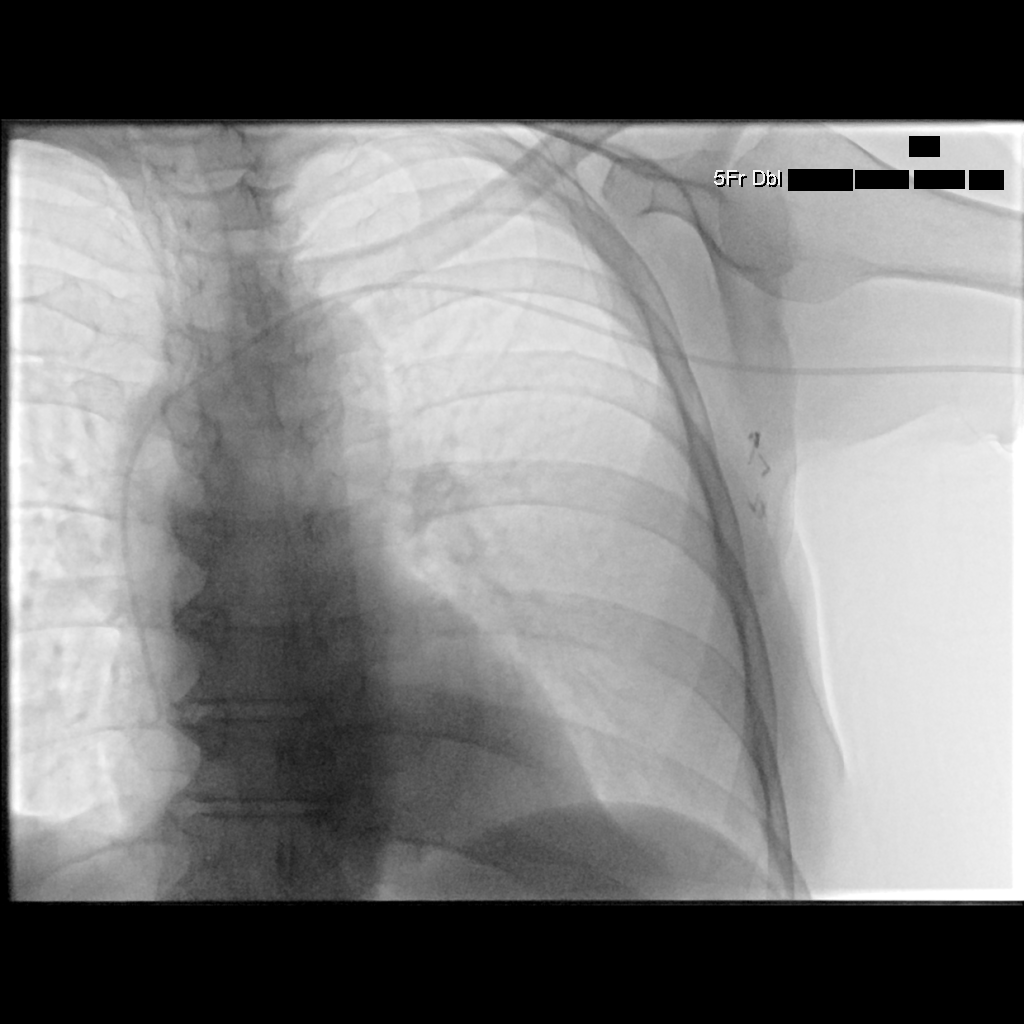

[5 of 5 positions shown; findings below may reference images not displayed]

Right PICC line catheter injection: Contrast injection performed of
the right PICC line with subtraction imaging. This demonstrates
arterial placement of the right PICC line with the tip in the aortic
arch. Because of the arterial placement, the PICC line was removed
without difficulty. Hemostasis obtained with manual compression at
the right brachial puncture site for 15 minutes. Sterile compression
dressing applied. No immediate complication. Right arm will be
immobilized for 4 hours in an arm sling.

Left upper extremity PICC line insertion: The left upper extremity
was prepped with chlorhexidine in a sterile fashion, and a sterile
drape was applied covering the operative field. Maximum barrier
sterile technique with sterile gowns and gloves were used for the
procedure. A timeout was performed prior to the initiation of the
procedure. Local anesthesia was provided with 1% lidocaine.

Under direct ultrasound guidance, the left basilic vein was accessed
with a micropuncture kit after the overlying soft tissues were
anesthetized with 1% lidocaine. An ultrasound image was saved for
documentation purposes. A guidewire was advanced to the level of the
superior caval-atrial junction for measurement purposes and the PICC
line was cut to length. A peel-away sheath was placed and a 40 cm, 5
French, dual lumen was inserted to level of the superior
caval-atrial junction. A post procedure spot fluoroscopic was
obtained. The catheter easily aspirated and flushed and was sutured
in place. A dressing was placed. The patient tolerated the procedure
well without immediate post procedural complication.
FINDINGS: After catheter placement, the tip lies within the superior
cavoatrial junction. The catheter aspirates and flushes normally and
is ready for immediate use.
IMPRESSION: Right PICC line confirmed to be within the brachial artery with tip
positioned in the aortic arch. Uncomplicated right PICC line removal
as above.

Successful ultrasound and fluoroscopic guided placement of a left
basilic vein approach, 40 cm, 5 French, dual lumen PICC with tip at
the superior caval-atrial junction. The PICC line is ready for
immediate use.

## 2018-05-22 MED FILL — BELLADONNA-OPIUM 16.2-60 SU: 16.2-60 | 3 days supply | Qty: 6 | Fill #0

## 2018-05-23 ENCOUNTER — Telehealth: Payer: Self-pay | Admitting: Emergency Medicine

## 2018-05-23 NOTE — Telephone Encounter (Signed)
Post ED Visit - Positive Culture Follow-up  Culture report reviewed by antimicrobial stewardship pharmacist:  []  Elenor Quinones, Pharm.D. []  Heide Guile, Pharm.D., BCPS AQ-ID []  Parks Neptune, Pharm.D., BCPS []  Alycia Rossetti, Pharm.D., BCPS []  Sherman, Pharm.D., BCPS, AAHIVP []  Legrand Como, Pharm.D., BCPS, AAHIVP [x]  Salome Arnt, PharmD, BCPS []  Johnnette Gourd, PharmD, BCPS []  Hughes Better, PharmD, BCPS []  Leeroy Cha, PharmD  Positive urine culture Treated with fosfomycin, organism sensitive to the same and no further patient follow-up is required at this time.  Hazle Nordmann 05/23/2018, 12:31 PM

## 2018-05-24 DIAGNOSIS — E1122 Type 2 diabetes mellitus with diabetic chronic kidney disease: Secondary | ICD-10-CM | POA: Diagnosis not present

## 2018-05-24 DIAGNOSIS — N179 Acute kidney failure, unspecified: Secondary | ICD-10-CM | POA: Diagnosis not present

## 2018-05-24 DIAGNOSIS — T82594D Other mechanical complication of infusion catheter, subsequent encounter: Secondary | ICD-10-CM | POA: Diagnosis not present

## 2018-05-25 ENCOUNTER — Ambulatory Visit: Payer: BLUE CROSS/BLUE SHIELD | Admitting: Family Medicine

## 2018-05-25 ENCOUNTER — Encounter: Payer: Self-pay | Admitting: Family Medicine

## 2018-05-25 VITALS — BP 118/68 | HR 67 | Temp 98.1°F | Resp 16 | Ht 62.0 in | Wt 201.8 lb

## 2018-05-25 DIAGNOSIS — G8928 Other chronic postprocedural pain: Secondary | ICD-10-CM | POA: Insufficient documentation

## 2018-05-25 DIAGNOSIS — R319 Hematuria, unspecified: Secondary | ICD-10-CM

## 2018-05-25 DIAGNOSIS — N390 Urinary tract infection, site not specified: Secondary | ICD-10-CM

## 2018-05-25 DIAGNOSIS — C569 Malignant neoplasm of unspecified ovary: Secondary | ICD-10-CM

## 2018-05-25 DIAGNOSIS — C55 Malignant neoplasm of uterus, part unspecified: Secondary | ICD-10-CM | POA: Diagnosis not present

## 2018-05-25 DIAGNOSIS — E1129 Type 2 diabetes mellitus with other diabetic kidney complication: Secondary | ICD-10-CM

## 2018-05-25 LAB — POC URINALSYSI DIPSTICK (AUTOMATED)
Bilirubin, UA: NEGATIVE
GLUCOSE UA: NEGATIVE
KETONES UA: NEGATIVE
NITRITE UA: POSITIVE
Protein, UA: POSITIVE — AB
SPEC GRAV UA: 1.015 (ref 1.010–1.025)
UROBILINOGEN UA: 0.2 U/dL
pH, UA: 6 (ref 5.0–8.0)

## 2018-05-25 MED ORDER — CEFTRIAXONE SODIUM 1 G IJ SOLR
1.0000 g | Freq: Once | INTRAMUSCULAR | Status: AC
  Administered 2018-05-25: 1 g via INTRAMUSCULAR

## 2018-05-25 MED ORDER — HYDROCODONE-ACETAMINOPHEN 5-325 MG PO TABS: 1.0000 | ORAL_TABLET | Freq: Four times a day (QID) | ORAL | 0 refills | Status: DC | PRN

## 2018-05-25 MED ORDER — FENTANYL 50 MCG/HR TD PT72: 50.0000 ug | MEDICATED_PATCH | TRANSDERMAL | 0 refills | Status: DC

## 2018-05-25 NOTE — Patient Instructions (Signed)

## 2018-05-25 NOTE — Assessment & Plan Note (Signed)
Refill pain med Contract utd Database reviewed

## 2018-05-25 NOTE — Assessment & Plan Note (Signed)
Per endo Foot exam done today

## 2018-05-25 NOTE — Assessment & Plan Note (Signed)
On chronic pain meds Refill meds

## 2018-05-25 NOTE — Assessment & Plan Note (Addendum)
On omnicef Recheck urine today Rocephin 1 g IM F/u urology/ gyn  rto tomorrow for rocephin 1 g

## 2018-05-25 NOTE — Progress Notes (Signed)
Patient ID: [name], female    DOB: 09/21/1955  Age: 62 y.o. MRN: [id number]    Subjective:  Subjective  HPI [name] presents for pain med refill and c/o being in er for uti--- started [facility] yesterday She feels like it may be getting better.  She is also requesting her feet be checked because her endo does not check them   Review of Systems  Constitutional: Negative for chills and fever.  HENT: Negative for congestion and hearing loss.   Eyes: Negative for discharge.  Respiratory: Negative for cough and shortness of breath.   Cardiovascular: Negative for chest pain, palpitations and leg swelling.  Gastrointestinal: Negative for abdominal pain, blood in stool, constipation, diarrhea, nausea and vomiting.  Genitourinary: Positive for flank pain. Negative for dysuria, frequency, hematuria and urgency.  Musculoskeletal: Positive for back pain. Negative for myalgias.  Skin: Negative for rash.  Allergic/Immunologic: Negative for environmental allergies.  Neurological: Negative for dizziness, weakness and headaches.  Hematological: Does not bruise/bleed easily.  Psychiatric/Behavioral: Negative for suicidal ideas. The patient is not nervous/anxious.     History Past Medical History:  Diagnosis Date  . Adrenal adenoma, left 02/08/2016   CT: stable benign  . Anemia in neoplastic disease   . Back pain   . Benign essential HTN   . 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 Cancer of corpus uteri, except isthmus [facility])  oncologist-- dr Denman [name] and dr Alvy Bimler    10-15-2004  dx endometroid endometrial and ovarian cancer s/p  chemotheapy and surgery(TAH w/ BSO) :  recurrent 11-19-2014 post pelvic surgery and radiation 01-29-2015 to 03-10-2015  . Chronic idiopathic neutropenia (HCC)    presumed  related to chemotherapy March 2006--- followed by dr Alvy Bimler (treatment w/ G-CSF injections  . Chronic nausea   . Chronic pain    perineal/ anal  area from bladder pad irritates skin , right flank pain  . CKD stage G2/A3, GFR 60-89 and albumin creatinine ratio >300 mg/g    nephrologist-  dr Madelon Lips  . Colovesical fistula   . Diabetic retinopathy, background ([place])   . Difficult intravenous access    small veins--- hx PICC lines  . DM type 2 (diabetes mellitus, type 2) ([place])    monitored by dr Legrand Como altheimer  . Dysuria   . Environmental and seasonal allergies   . Fatty liver 02/08/2016   CT  . Generalized muscle weakness   . GERD (gastroesophageal reflux disease)   . Hiatal hernia   . History of abdominal abscess 04/16/2017   post surgery 04-01-2017  --- resolved 10/ 2018  . History of gastric polyp    2014  duodenum  . History of ileus 04/16/2017   resolved w/ no surgical intervention  . History of radiation therapy    01-29-2015 to 03-10-2015  pelvis 50.4Gy  . Hypothyroidism    monitored by dr Legrand Como altheimer  . IBS (irritable bowel syndrome)   . Ileostomy in place [facility]) 04/01/2017   created at same time colostomy takedown.  . Joint pain   . Leg edema   . Lower urinary tract symptoms (LUTS)    urge urinary  incontinence  . Mixed dyslipidemia   . Multiple thyroid nodules    Managed by Dr. Harlow Asa  . Nephrostomy status ([place])   . Palpitations   .  Pelvic abscess in female 04/16/2017  . PONV (postoperative nausea and vomiting)    "scopolamine patch works for me"  . Radiation-induced dermatitis    contact dermatitis , radiation completed, rash only on ankles now.  . Seasonal allergies   . Ureteral stricture, right UROLOGIT-  DR [facility]   CHRONIC--  TREATMENT URETERAL STENT  . 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 Vitamin D deficiency   . Wears glasses     She has a past surgical history that includes Appendectomy; Tonsillectomy; Colonoscopy with  propofol (N/A, 08/21/2013); EUS (N/A, 10/02/2014); Robotic assisted lap vaginal hysterectomy (N/A, 11/19/2014); Ostomy (N/A, 11/19/2014); Colostomy takedown (N/A, 12/04/2014); Cystoscopy with retrograde pyelogram, ureteroscopy and stent placement (Right, 03/20/2015); EXCISION SOFT TISSUE MASS RIGHT FOREMAN (12-08-2006); Cystoscopy with retrograde pyelogram, ureteroscopy and stent placement (Right, 05/02/2015); Cystoscopy with retrograde pyelogram, ureteroscopy and stent placement (Right, 09/05/2015); Cystoscopy w/ retrogrades (Right, 11/21/2015); Cystoscopy w/ ureteral stent placement (Right, 11/21/2015); Mastectomy w/ sentinel node biopsy (Bilateral, 12/11/2015); Breast reconstruction with placement of tissue expander and flex hd (acellular hydrated dermis) (Bilateral, 12/11/2015); Incision and drainage of wound (Bilateral, 12/26/2015); Tissue expander filling (Bilateral, 12/26/2015); Laparoscopic cholecystectomy (1990); Eye surgery (as child); Cystoscopy w/ ureteral stent placement (Right, 03/10/2016); Removal of bilateral tissue expanders with placement of bilateral breast implants (Bilateral, 04/16/2016); Liposuction with lipofilling (Bilateral, 04/16/2016); Cystoscopy w/ ureteral stent placement (Right, 06/30/2016); Cystoscopy with stent placement (Right, 10/27/2016); XI robotic assisted lower anterior resection (N/A, 04/01/2017); Proctoscopy (N/A, 04/01/2017); Cystoscopy with retrograde pyelogram, ureteroscopy and stent placement (Right, 04/01/2017); IR US Guide Vasc Access Right (04/06/2017); IR Fluoro Guide CV Line Right (04/06/2017); IR US Guide Vasc Access Right (05/30/2017); IR Fluoro Guide CV Midline PICC Right (05/30/2017); IR Fluoro Guide CV Line Left (05/31/2017); IR US Guide Vasc Access Left (05/31/2017); IR CV Line Injection (05/31/2017); IR Radiologist Eval & Mgmt (05/03/2017); Cystoscopy w/ ureteral stent placement (N/A, 06/01/2017); Cystogram (N/A, 06/01/2017); Total abdominal hysterectomy (March 2006   [facility]); Cystoscopy  w/ ureteral stent placement (Right, 08/17/2017); IR NEPHROSTOGRAM RIGHT INITIAL PLACEMENT (09/02/2017); IR NEPHROSTOGRAM LEFT INITIAL PLACEMENT (09/02/2017); IR NEPHROSTOGRAM RIGHT THRU EXISTING ACCESS (09/13/2017); IR NEPHROSTOMY PLACEMENT LEFT (10/02/2017); IR NEPHROSTOMY EXCHANGE RIGHT (10/02/2017); IR NEPHROSTOMY EXCHANGE RIGHT (11/28/2017); IR NEPHROSTOMY EXCHANGE LEFT (11/28/2017); IR NEPHROSTOGRAM LEFT THRU EXISTING ACCESS (11/29/2017); IR NEPHROSTOGRAM RIGHT THRU EXISTING ACCESS (11/29/2017); IR NEPHROSTOMY EXCHANGE LEFT (01/05/2018); IR NEPHROSTOMY EXCHANGE RIGHT (01/05/2018); IR NEPHROSTOMY EXCHANGE RIGHT (02/16/2018); IR NEPHROSTOMY EXCHANGE LEFT (02/16/2018); IR NEPHROSTOMY EXCHANGE RIGHT (03/30/2018); IR NEPHROSTOMY EXCHANGE LEFT (03/30/2018); biopsy thyroid nodules; IR NEPHROSTOMY EXCHANGE RIGHT (05/12/2018); and IR NEPHROSTOMY EXCHANGE LEFT (05/12/2018).   Her family history includes Brain cancer in her paternal grandfather; Breast cancer in her maternal aunt; Cancer (age of onset: 69) in her mother; Cancer (age of onset: 77) in her father; Diabetes in her brother, father, and sister; Heart disease in her brother and father; Hyperlipidemia in her father; Hypertension in her father and mother; Hypertension (age of onset: y-10) in her brother; Lymphoma in her paternal aunt; Obesity in her father; Ovarian cancer in her other.She reports that she has never smoked. She has never used smokeless tobacco. She reports that she drank alcohol. She reports that she does not use drugs.  Current Outpatient Medications on File Prior to Visit  Medication Sig Dispense Refill  . acetaminophen (TYLENOL) 325 MG tablet Take 2 tablets (650 mg total) by mouth every 6 (six) hours as needed. 30 tablet 1  . anastrozole (ARIMIDEX) 1 MG tablet TAKE 1 TABLET DAILY (Patient taking differently: Take 1 mg by  mouth daily. ) 90 tablet 3  . Biotin 5 MG TABS Take 5 mg by mouth every morning.     . cefdinir (OMNICEF) 300 MG capsule Take 1 capsule by  mouth 2 (two) times daily.  0  . Cholecalciferol (VITAMIN D3) 10000 UNITS capsule Take 10,000 Units by mouth once a week. Sunday evening's    . clobetasol (OLUX) 0.05 % topical foam Apply topically 2 (two) times daily.    . collagenase (SANTYL) ointment Apply 1 application topically daily as needed (skin care).     . diphenhydrAMINE (BENADRYL) 25 mg capsule Take 1 capsule (25 mg total) by mouth every 8 (eight) hours as needed for itching, allergies or sleep. 30 capsule 0  . diphenoxylate-atropine (LOMOTIL) 2.5-0.025 MG tablet Take 1 tablet by mouth 4 (four) times daily as needed for diarrhea or loose stools. TO PREVENT LOOSE BOWEL MOVEMENTS (Patient taking differently: Take 1 tablet by mouth every other day. TO PREVENT LOOSE BOWEL MOVEMENTS) 30 tablet 0  . ferrous sulfate 325 (65 FE) MG tablet Take 1 tablet (325 mg total) by mouth at bedtime. 30 tablet 3  . fosfomycin (MONUROL) 3 g PACK Take 3 g by mouth every other day. 9 g 0  . insulin glargine (LANTUS) 100 UNIT/ML injection Inject 8 Units into the skin 2 (two) times daily.     [name] Kitchen JANUVIA 50 MG tablet Take 50 mg by mouth daily.     [name] Kitchen levothyroxine (SYNTHROID, LEVOTHROID) 150 MCG tablet Take 1 tablet (150 mcg total) by mouth daily before breakfast. 30 tablet 1  . loratadine (CLARITIN) 10 MG tablet Take 10 mg by mouth every morning.     [name] Kitchen LYRICA 50 MG capsule TAKE 1 CAPSULE(50 MG) BY MOUTH TWICE DAILY (Patient taking differently: Take 50 mg by mouth 2 (two) times daily. ) 180 capsule 0  . nitrofurantoin, macrocrystal-monohydrate, (MACROBID) 100 MG capsule Take 100 mg by mouth at bedtime.    [name] Kitchen omega-3 acid ethyl esters (LOVAZA) 1 G capsule Take 1 g by mouth 2 (two) times daily.     Vladimir Faster Glycol-Propyl Glycol (SYSTANE OP) Place 1 drop into both eyes daily as needed (dry eyes).     . Prenatal Vit-Fe Fumarate-FA (PRENATAL VITAMIN PO) Take 1 capsule by mouth daily. Takes prenatal because there are no dyes in it    . protein supplement shake (PREMIER  PROTEIN) LIQD Take 325 mLs (11 oz total) by mouth 2 (two) times daily as needed (Pt to request). 60 Can 1  . rosuvastatin (CRESTOR) 10 MG tablet Take 10 mg by mouth every evening.     . Saccharomyces boulardii (FLORASTOR PO) Take 1 capsule by mouth daily.    . sodium bicarbonate 650 MG tablet Take 650 mg by mouth 2 (two) times daily.    . sodium chloride 0.9 % injection Inject 10 mLs into the vein daily.    . Tbo-Filgrastim (GRANIX) 480 MCG/0.8ML SOSY injection Inject 0.8 mLs (480 mcg total) into the skin daily. (Patient taking differently: Inject 480 mcg into the skin See admin instructions. Every 6 days) 30 Syringe 11   No current facility-administered medications on file prior to visit.      Objective:  Objective  Physical Exam  Constitutional: She is oriented to person, place, and time. She appears well-developed and well-nourished.  HENT:  Head: Normocephalic and atraumatic.  Eyes: Conjunctivae and EOM are normal.  Neck: Normal range of motion. Neck supple. No JVD present. Carotid bruit is not present. No thyromegaly present.  Cardiovascular: Normal rate, regular rhythm and normal heart sounds.  No murmur heard. Pulmonary/Chest: Effort normal and breath sounds normal. No respiratory distress. She has no wheezes. She has no rales. She exhibits no tenderness.  Musculoskeletal: She exhibits no edema.  Neurological: She is alert and oriented to person, place, and time.  Psychiatric: She has a normal mood and affect.  Nursing note and vitals reviewed.  Diabetic Foot Exam - Simple   Simple Foot Form Diabetic Foot exam was performed with the following findings:  Yes 05/25/2018  5:03 PM  Visual Inspection No deformities, no ulcerations, no other skin breakdown bilaterally:  Yes Sensation Testing Intact to touch and monofilament testing bilaterally:  Yes Pulse Check Posterior Tibialis and Dorsalis pulse intact bilaterally:  Yes Comments    BP 118/68 (BP Location: Left Arm, Cuff  Size: Large)   Pulse 67   Temp 98.1 F (36.7 C) (Oral)   Resp 16   Ht 5' 2"  (1.575 m)   Wt 201 lb 12.8 oz (91.5 kg)   SpO2 99%   BMI 36.91 kg/m  Wt Readings from Last 3 Encounters:  05/25/18 201 lb 12.8 oz (91.5 kg)  05/16/18 197 lb (89.4 kg)  05/02/18 198 lb (89.8 kg)     Lab Results  Component Value Date   WBC 4.4 05/19/2018   HGB 10.4 (L) 05/19/2018   HCT 35.1 (L) 05/19/2018   PLT 350 05/19/2018   GLUCOSE 183 (H) 05/19/2018   CHOL 109 08/28/2015   TRIG 89 04/18/2017   HDL 43.80 08/28/2015   LDLCALC 45 08/28/2015   ALT 54 (H) 05/19/2018   AST 30 05/19/2018   NA 135 05/19/2018   K 4.3 05/19/2018   CL 104 05/19/2018   CREATININE 1.90 (H) 05/19/2018   BUN 38 (H) 05/19/2018   CO2 22 05/19/2018   TSH 2.408 08/30/2017   INR 1.02 11/29/2017   HGBA1C 7.9 (H) 09/01/2017   MICROALBUR 5.6 (H) 08/30/2016    No results found.   Assessment & Plan:  Plan  I am having Wanisha B. Manfredi maintain her rosuvastatin, omega-3 acid ethyl esters, Vitamin D3, Polyethyl Glycol-Propyl Glycol (SYSTANE OP), Biotin, loratadine, Prenatal Vit-Fe Fumarate-FA (PRENATAL VITAMIN PO), acetaminophen, diphenhydrAMINE, diphenoxylate-atropine, ferrous sulfate, levothyroxine, protein supplement shake, collagenase, Tbo-Filgrastim, insulin glargine, sodium chloride, JANUVIA, anastrozole, Saccharomyces boulardii (FLORASTOR PO), clobetasol, nitrofurantoin (macrocrystal-monohydrate), LYRICA, sodium bicarbonate, fosfomycin, cefdinir, fentaNYL, and HYDROcodone-acetaminophen. We administered cefTRIAXone and cefTRIAXone.  Meds ordered this encounter  Medications  . fentaNYL (DURAGESIC) 50 MCG/HR    Sig: Place 1 patch (50 mcg total) onto the skin every 3 (three) days.    Dispense:  10 patch    Refill:  0  . HYDROcodone-acetaminophen (NORCO/VICODIN) 5-325 MG tablet    Sig: Take 1 tablet by mouth every 6 (six) hours as needed for moderate pain.    Dispense:  30 tablet    Refill:  0  . cefTRIAXone (ROCEPHIN)  injection 1 g  . cefTRIAXone (ROCEPHIN) injection 1 g    Problem List Items Addressed This Visit      Unprioritized   Malignant neoplasm of uterus (HCC)   Relevant Medications   cefdinir (OMNICEF) 300 MG capsule   fentaNYL (DURAGESIC) 50 MCG/HR   cefTRIAXone (ROCEPHIN) injection 1 g (Completed)   cefTRIAXone (ROCEPHIN) injection 1 g (Completed)   Other chronic postprocedural pain    Refill pain med Contract utd Database reviewed       Relevant Medications   HYDROcodone-acetaminophen (NORCO/VICODIN) 5-325 MG tablet   Primary malignant  neoplasm of ovary (HCC)    On chronic pain meds Refill meds      Relevant Medications   cefdinir (OMNICEF) 300 MG capsule   cefTRIAXone (ROCEPHIN) injection 1 g (Completed)   cefTRIAXone (ROCEPHIN) injection 1 g (Completed)   Type II diabetes mellitus with renal manifestations ([place])    Per endo Foot exam done today       UTI (urinary tract infection) - Primary    On omnicef Recheck urine today Rocephin 1 g IM F/u urology/ gyn  rto tomorrow for rocephin 1 g       Relevant Medications   cefdinir (OMNICEF) 300 MG capsule   cefTRIAXone (ROCEPHIN) injection 1 g (Completed)   cefTRIAXone (ROCEPHIN) injection 1 g (Completed)   Other Relevant Orders   POCT Urinalysis Dipstick (Automated) (Completed)   Urine Culture      Follow-up: Return in about 3 months (around 08/25/2018), or pain med.  Ann Held, DO

## 2018-05-26 ENCOUNTER — Ambulatory Visit (INDEPENDENT_AMBULATORY_CARE_PROVIDER_SITE_OTHER): Payer: BLUE CROSS/BLUE SHIELD

## 2018-05-26 ENCOUNTER — Telehealth: Payer: Self-pay

## 2018-05-26 DIAGNOSIS — L89319 Pressure ulcer of right buttock, unspecified stage: Secondary | ICD-10-CM | POA: Diagnosis not present

## 2018-05-26 DIAGNOSIS — C55 Malignant neoplasm of uterus, part unspecified: Secondary | ICD-10-CM | POA: Diagnosis not present

## 2018-05-26 DIAGNOSIS — R319 Hematuria, unspecified: Principal | ICD-10-CM

## 2018-05-26 DIAGNOSIS — N390 Urinary tract infection, site not specified: Secondary | ICD-10-CM

## 2018-05-26 DIAGNOSIS — Z933 Colostomy status: Secondary | ICD-10-CM | POA: Diagnosis not present

## 2018-05-26 MED ORDER — CEFTRIAXONE SODIUM 1 G IJ SOLR
1.0000 g | Freq: Once | INTRAMUSCULAR | Status: AC
  Administered 2018-05-26: 1 g via INTRAMUSCULAR

## 2018-05-26 NOTE — Telephone Encounter (Addendum)
Patient in for Rocephin 1 gm injection. Would like to know if she is supposed to come in for a 3rd dose.Please advise.

## 2018-05-26 NOTE — Progress Notes (Signed)
Pre visit review using our clinic tool,if applicable. No additional management support is needed unless otherwise documented below in the visit note.   Patient in per order from Dr. Carollee Herter for Rocephin  1gm injection.  Given IM right hip per patient request. .Patient tolerated well.

## 2018-05-26 NOTE — Telephone Encounter (Signed)
Did Dr. Etter Sjogren say anything about a 3rd shot?  Still awaiting results of culture.

## 2018-05-26 NOTE — Telephone Encounter (Signed)
2 shots can be a full treatment it depends on how she is feeling. If she is improving she does not need futher treatment.

## 2018-05-28 LAB — URINE CULTURE
MICRO NUMBER:: 91280786
SPECIMEN QUALITY: ADEQUATE

## 2018-05-29 ENCOUNTER — Encounter: Payer: Self-pay | Admitting: *Deleted

## 2018-05-29 NOTE — Telephone Encounter (Signed)
Patient stated that she is feeling a little better.  She will be back in on 06/02/18 for lab only to recheck urine.  Patient stated that she has an appt in November for Nephrology.  Will send copy of labs to urology and nephrology.

## 2018-05-29 NOTE — Telephone Encounter (Signed)
She should not need anymore tx--- repeat this week She should f/u urology as well

## 2018-05-29 NOTE — Addendum Note (Signed)
Addended by: Kem Boroughs D on: 05/29/2018 10:03 AM   Modules accepted: Orders

## 2018-05-31 ENCOUNTER — Ambulatory Visit (INDEPENDENT_AMBULATORY_CARE_PROVIDER_SITE_OTHER): Payer: BLUE CROSS/BLUE SHIELD | Admitting: Family Medicine

## 2018-05-31 VITALS — BP 100/68 | HR 64 | Temp 97.6°F | Ht 62.0 in | Wt 199.0 lb

## 2018-05-31 DIAGNOSIS — F3289 Other specified depressive episodes: Secondary | ICD-10-CM | POA: Diagnosis not present

## 2018-05-31 DIAGNOSIS — N182 Chronic kidney disease, stage 2 (mild): Secondary | ICD-10-CM | POA: Diagnosis not present

## 2018-05-31 DIAGNOSIS — T82594D Other mechanical complication of infusion catheter, subsequent encounter: Secondary | ICD-10-CM | POA: Diagnosis not present

## 2018-05-31 DIAGNOSIS — Z6836 Body mass index (BMI) 36.0-36.9, adult: Secondary | ICD-10-CM

## 2018-05-31 DIAGNOSIS — E860 Dehydration: Secondary | ICD-10-CM | POA: Diagnosis not present

## 2018-05-31 NOTE — Progress Notes (Signed)
Office: [phone]  /  Fax: [phone]   HPI:   Chief Complaint: OBESITY [name] is here to discuss her progress with her obesity treatment plan. She is on the  follow the Category 2 plan and is following her eating plan approximately 80 % of the time. She states she is exercising 0 minutes 0 times per week. [name] has been sick recently with a UTI and unable to follow her plan. She is retaining some fluid today. She notes some increase in carb cravings and emotional eating while not feeling well. She is starting to feel better and she thinks she can get back to journaling regularly.   Her weight is 199 lb (90.3 kg) today and gained 2 lbs since her last visit. She has lost 11 lbs since starting treatment with [place].  Depression with emotional eating behaviors [name] is struggling with emotional eating and using food for comfort to the extent that it is negatively impacting her health. She often snacks when she is not hungry. [name] sometimes feels she is out of control and then feels guilty that she made poor food choices. She has been working on behavior modification techniques to help reduce her emotional eating and has been very successful. She shows no sign of suicidal or homicidal ideations. Patient has multiple severe health issues and frequently overwhelmed. She is on some medicines that often interact with antidepressants. She denies suicidal idealations but does struggle with emotional eating.   Depression screen [name] 2/9 04/20/2018 04/05/2018 06/21/2017 08/30/2016  Decreased Interest 0 1 0 0  Down, Depressed, Hopeless 1 1 0 0  PHQ - 2 Score 1 2 0 0  Altered sleeping 0 0 - -  Tired, decreased energy 1 1 - -  Change in appetite 0 1 - -  Feeling bad or failure about yourself  0 0 - -  Trouble concentrating 0 0 - -  Moving slowly or fidgety/restless 0 0 - -  Suicidal thoughts 0 0 - -  PHQ-9 Score 2 4 - -  Difficult doing work/chores - Somewhat difficult - -  Some recent data might be  hidden      ALLERGIES: Allergies  Allergen Reactions  . Penicillins Swelling    Facial swelling/childhood allergy Has patient had a PCN reaction causing immediate rash, facial/tongue/throat swelling, SOB or lightheadedness with hypotension: Yes Has patient had a PCN reaction causing severe rash involving mucus membranes or skin necrosis: Yes Has patient had a PCN reaction that required hospitalization yes Has patient had a PCN reaction occurring within the last 10 years: No If all of the above answers are "NO", then may proceed with Cephalosporin use.   . Cefaclor Rash    Ceclor  . Erythromycin Other (See Comments)    Gastritis, abd cramps  . Tape Rash    blisters  . Trimethoprim Rash  . Ultram [Tramadol] Hives  . Cephalosporins Rash  . Fluconazole Rash  . Oxycodone Other (See Comments)    " I just feel weird"  . Pectin Rash    Pectin ring for stoma  . Septra [Sulfamethoxazole-Trimethoprim] Rash  . Sulfa Antibiotics Rash    MEDICATIONS: Current Outpatient Medications on File Prior to Visit  Medication Sig Dispense Refill  . acetaminophen (TYLENOL) 325 MG tablet Take 2 tablets (650 mg total) by mouth every 6 (six) hours as needed. 30 tablet 1  . anastrozole (ARIMIDEX) 1 MG tablet TAKE 1 TABLET DAILY (Patient taking differently: Take 1 mg by mouth daily. ) 90 tablet  3  . Biotin 5 MG TABS Take 5 mg by mouth every morning.     . cefdinir (OMNICEF) 300 MG capsule Take 1 capsule by mouth 2 (two) times daily.  0  . Cholecalciferol (VITAMIN D3) 10000 UNITS capsule Take 10,000 Units by mouth once a week. Sunday evening's    . clobetasol (OLUX) 0.05 % topical foam Apply topically 2 (two) times daily.    . collagenase (SANTYL) ointment Apply 1 application topically daily as needed (skin care).     . diphenhydrAMINE (BENADRYL) 25 mg capsule Take 1 capsule (25 mg total) by mouth every 8 (eight) hours as needed for itching, allergies or sleep. 30 capsule 0  . diphenoxylate-atropine  (LOMOTIL) 2.5-0.025 MG tablet Take 1 tablet by mouth 4 (four) times daily as needed for diarrhea or loose stools. TO PREVENT LOOSE BOWEL MOVEMENTS (Patient taking differently: Take 1 tablet by mouth every other day. TO PREVENT LOOSE BOWEL MOVEMENTS) 30 tablet 0  . fentaNYL (DURAGESIC) 50 MCG/HR Place 1 patch (50 mcg total) onto the skin every 3 (three) days. 10 patch 0  . ferrous sulfate 325 (65 FE) MG tablet Take 1 tablet (325 mg total) by mouth at bedtime. 30 tablet 3  . fosfomycin (MONUROL) 3 g PACK Take 3 g by mouth every other day. 9 g 0  . HYDROcodone-acetaminophen (NORCO/VICODIN) 5-325 MG tablet Take 1 tablet by mouth every 6 (six) hours as needed for moderate pain. 30 tablet 0  . insulin glargine (LANTUS) 100 UNIT/ML injection Inject 8 Units into the skin 2 (two) times daily.     [name] Kitchen JANUVIA 50 MG tablet Take 50 mg by mouth daily.     [name] Kitchen levothyroxine (SYNTHROID, LEVOTHROID) 150 MCG tablet Take 1 tablet (150 mcg total) by mouth daily before breakfast. 30 tablet 1  . loratadine (CLARITIN) 10 MG tablet Take 10 mg by mouth every morning.     [name] Kitchen LYRICA 50 MG capsule TAKE 1 CAPSULE(50 MG) BY MOUTH TWICE DAILY (Patient taking differently: Take 50 mg by mouth 2 (two) times daily. ) 180 capsule 0  . nitrofurantoin, macrocrystal-monohydrate, (MACROBID) 100 MG capsule Take 100 mg by mouth at bedtime.    [name] Kitchen omega-3 acid ethyl esters (LOVAZA) 1 G capsule Take 1 g by mouth 2 (two) times daily.     Vladimir Faster Glycol-Propyl Glycol (SYSTANE OP) Place 1 drop into both eyes daily as needed (dry eyes).     . Prenatal Vit-Fe Fumarate-FA (PRENATAL VITAMIN PO) Take 1 capsule by mouth daily. Takes prenatal because there are no dyes in it    . protein supplement shake (PREMIER PROTEIN) LIQD Take 325 mLs (11 oz total) by mouth 2 (two) times daily as needed (Pt to request). 60 Can 1  . rosuvastatin (CRESTOR) 10 MG tablet Take 10 mg by mouth every evening.     . Saccharomyces boulardii (FLORASTOR PO) Take 1 capsule by  mouth daily.    . sodium bicarbonate 650 MG tablet Take 650 mg by mouth 2 (two) times daily.    . sodium chloride 0.9 % injection Inject 10 mLs into the vein daily.    . Tbo-Filgrastim (GRANIX) 480 MCG/0.8ML SOSY injection Inject 0.8 mLs (480 mcg total) into the skin daily. (Patient taking differently: Inject 480 mcg into the skin See admin instructions. Every 6 days) 30 Syringe 11   No current facility-administered medications on file prior to visit.     PAST MEDICAL HISTORY: Past Medical History:  Diagnosis Date  . Adrenal adenoma, left  02/08/2016   CT: stable benign  . Anemia in neoplastic disease   . Back pain   . Benign essential HTN   . 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 Cancer of corpus uteri, except isthmus [facility])  oncologist-- dr Denman [name] and dr Alvy Bimler    10-15-2004  dx endometroid endometrial and ovarian cancer s/p  chemotheapy and surgery(TAH w/ BSO) :  recurrent 11-19-2014 post pelvic surgery and radiation 01-29-2015 to 03-10-2015  . Chronic idiopathic neutropenia (HCC)    presumed related to chemotherapy March 2006--- followed by dr Alvy Bimler (treatment w/ G-CSF injections  . Chronic nausea   . Chronic pain    perineal/ anal  area from bladder pad irritates skin , right flank pain  . CKD stage G2/A3, GFR 60-89 and albumin creatinine ratio >300 mg/g    nephrologist-  dr Madelon Lips  . Colovesical fistula   . Diabetic retinopathy, background ([place])   . Difficult intravenous access    small veins--- hx PICC lines  . DM type 2 (diabetes mellitus, type 2) ([place])    monitored by dr Legrand Como altheimer  . Dysuria   . Environmental and seasonal allergies   . Fatty liver 02/08/2016   CT  . Generalized muscle weakness   . GERD (gastroesophageal reflux disease)   . Hiatal hernia   .  History of abdominal abscess 04/16/2017   post surgery 04-01-2017  --- resolved 10/ 2018  . History of gastric polyp    2014  duodenum  . History of ileus 04/16/2017   resolved w/ no surgical intervention  . History of radiation therapy    01-29-2015 to 03-10-2015  pelvis 50.4Gy  . Hypothyroidism    monitored by dr Legrand Como altheimer  . IBS (irritable bowel syndrome)   . Ileostomy in place [name]) 04/01/2017   created at same time colostomy takedown.  . Joint pain   . Leg edema   . Lower urinary tract symptoms (LUTS)    urge urinary  incontinence  . Mixed dyslipidemia   . Multiple thyroid nodules    Managed by Dr. Harlow Asa  . Nephrostomy status ([place])   . Palpitations   . Pelvic abscess in female 04/16/2017  . PONV (postoperative nausea and vomiting)    "scopolamine patch works for me"  . Radiation-induced dermatitis    contact dermatitis , radiation completed, rash only on ankles now.  . Seasonal allergies   . Ureteral stricture, right UROLOGIT-  DR [name]   CHRONIC--  TREATMENT URETERAL STENT  . 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 Vitamin D deficiency   . Wears glasses     PAST SURGICAL HISTORY: Past Surgical History:  Procedure Laterality Date  . APPENDECTOMY    . biopsy thyroid nodules    . BREAST RECONSTRUCTION WITH PLACEMENT OF TISSUE EXPANDER AND FLEX HD (ACELLULAR HYDRATED DERMIS) Bilateral 12/11/2015   Procedure: BILATERAL BREAST RECONSTRUCTION WITH PLACEMENT OF TISSUE EXPANDERS;  Surgeon: Irene Limbo, MD;  Location: [place];  Service: Plastics;  Laterality: Bilateral;  . COLONOSCOPY WITH PROPOFOL N/A 08/21/2013   Procedure: COLONOSCOPY WITH PROPOFOL;  Surgeon: Cleotis Nipper, MD;  [facility];  Service: Endoscopy;  Laterality: N/A;  . COLOSTOMY TAKEDOWN N/A 12/04/2014   Procedure: LAPROSCOPIC LYSIS OF ADHESIONS, SPLENIC MOBILIZATION, RELOCATION OF COLOSTOMY, DEBRIDEMENT INITIAL COLOSTOMY SITE;  Surgeon: Michael [name],  MD;  [facility];   Service: General;  Laterality: N/A;  . CYSTOGRAM N/A 06/01/2017   Procedure: CYSTOGRAM;  Surgeon: Alexis Frock, MD;  [facility];  Service: Urology;  Laterality: N/A;  . CYSTOSCOPY W/ RETROGRADES Right 11/21/2015   Procedure: CYSTOSCOPY WITH RETROGRADE PYELOGRAM;  Surgeon: Alexis Frock, MD;  [facility];  Service: Urology;  Laterality: Right;  . CYSTOSCOPY W/ URETERAL STENT PLACEMENT Right 11/21/2015   Procedure: CYSTOSCOPY WITH STENT REPLACEMENT;  Surgeon: Alexis Frock, MD;  [facility];  Service: Urology;  Laterality: Right;  . CYSTOSCOPY W/ URETERAL STENT PLACEMENT Right 03/10/2016   Procedure: CYSTOSCOPY WITH STENT REPLACEMENT;  Surgeon: Alexis Frock, MD;  Location: [facility];  Service: Urology;  Laterality: Right;  . CYSTOSCOPY W/ URETERAL STENT PLACEMENT Right 06/30/2016   Procedure: CYSTOSCOPY WITH RETROGRADE PYELOGRAM/URETERAL STENT EXCHANGE;  Surgeon: Alexis Frock, MD;  Location[facility];  Service: Urology;  Laterality: Right;  . CYSTOSCOPY W/ URETERAL STENT PLACEMENT N/A 06/01/2017   Procedure: CYSTOSCOPY WITH EXAM UNDER ANESTHESIA;  Surgeon: Alexis Frock, MD;  [facility];  Service: Urology;  Laterality: N/A;  . CYSTOSCOPY W/ URETERAL STENT PLACEMENT Right 08/17/2017   Procedure: CYSTOSCOPY WITH RETROGRADE PYELOGRAM/URETERAL STENT REMOVAL;  Surgeon: Alexis Frock, MD;  Location: [name];  Service: Urology;  Laterality: Right;  . CYSTOSCOPY WITH RETROGRADE PYELOGRAM, URETEROSCOPY AND STENT PLACEMENT Right 03/20/2015   Procedure: CYSTOSCOPY WITH RETROGRADE PYELOGRAM, URETEROSCOPY WITH BALLOON DILATION AND STENT PLACEMENT ON RIGHT;  Surgeon: Alexis Frock, MD;  Location: [facility];  Service: Urology;  Laterality: Right;  . CYSTOSCOPY WITH RETROGRADE PYELOGRAM, URETEROSCOPY AND STENT PLACEMENT Right 05/02/2015   Procedure: CYSTOSCOPY WITH RIGHT RETROGRADE PYELOGRAM,  DIAGNOSTIC URETEROSCOPY  AND STENT PULL ;  Surgeon: Alexis Frock, MD;  Location: [name];  Service: Urology;  Laterality: Right;  . CYSTOSCOPY WITH RETROGRADE PYELOGRAM, URETEROSCOPY AND STENT PLACEMENT Right 09/05/2015   Procedure: CYSTOSCOPY WITH RETROGRADE PYELOGRAM,  AND STENT PLACEMENT;  Surgeon: Alexis Frock, MD;  [facility];  Service: Urology;  Laterality: Right;  . CYSTOSCOPY WITH RETROGRADE PYELOGRAM, URETEROSCOPY AND STENT PLACEMENT Right 04/01/2017   Procedure: CYSTOSCOPY WITH RETROGRADE PYELOGRAM, URETEROSCOPY AND STENT PLACEMENT;  Surgeon: Alexis Frock, MD;  [facility];  Service: Urology;  Laterality: Right;  . CYSTOSCOPY WITH STENT PLACEMENT Right 10/27/2016   Procedure: CYSTOSCOPY WITH STENT CHANGE and right retrograde pyelogram;  Surgeon: Alexis Frock, MD;  Location: [name];  Service: Urology;  Laterality: Right;  . EUS N/A 10/02/2014   Procedure: LOWER ENDOSCOPIC ULTRASOUND (EUS);  Surgeon: Arta Silence, MD;  Location: [facility];  Service: Endoscopy;  Laterality: N/A;  . EXCISION SOFT TISSUE MASS RIGHT FOREMAN  12-08-2006  . EYE SURGERY  as child   pytosis of eyelids repair  . INCISION AND DRAINAGE OF WOUND Bilateral 12/26/2015   Procedure: DEBRIDEMENT OF BILATERAL MASTECTOMY FLAPS;  Surgeon: Irene Limbo, MD;  Location: [place];  Service: Plastics;  Laterality: Bilateral;  . IR CV LINE INJECTION  05/31/2017  . IR FLUORO GUIDE CV LINE LEFT  05/31/2017  . IR FLUORO GUIDE CV LINE RIGHT  04/06/2017  . IR FLUORO GUIDE CV MIDLINE PICC RIGHT  05/30/2017  . IR NEPHROSTOGRAM LEFT INITIAL PLACEMENT  09/02/2017  . IR NEPHROSTOGRAM LEFT THRU EXISTING ACCESS  11/29/2017  . IR NEPHROSTOGRAM RIGHT INITIAL PLACEMENT  09/02/2017  . IR NEPHROSTOGRAM RIGHT THRU EXISTING ACCESS  09/13/2017  . IR NEPHROSTOGRAM RIGHT THRU  EXISTING ACCESS  11/29/2017  . IR NEPHROSTOMY EXCHANGE LEFT  11/28/2017  . IR NEPHROSTOMY EXCHANGE LEFT  01/05/2018  . IR NEPHROSTOMY  EXCHANGE LEFT  02/16/2018  . IR NEPHROSTOMY EXCHANGE LEFT  03/30/2018  . IR NEPHROSTOMY EXCHANGE LEFT  05/12/2018  . IR NEPHROSTOMY EXCHANGE RIGHT  10/02/2017  . IR NEPHROSTOMY EXCHANGE RIGHT  11/28/2017  . IR NEPHROSTOMY EXCHANGE RIGHT  01/05/2018  . IR NEPHROSTOMY EXCHANGE RIGHT  02/16/2018  . IR NEPHROSTOMY EXCHANGE RIGHT  03/30/2018  . IR NEPHROSTOMY EXCHANGE RIGHT  05/12/2018  . IR NEPHROSTOMY PLACEMENT LEFT  10/02/2017  . IR RADIOLOGIST EVAL & MGMT  05/03/2017  . IR US GUIDE VASC ACCESS LEFT  05/31/2017  . IR US GUIDE VASC ACCESS RIGHT  04/06/2017  . IR US GUIDE VASC ACCESS RIGHT  05/30/2017  . LAPAROSCOPIC CHOLECYSTECTOMY  1990  . LIPOSUCTION WITH LIPOFILLING Bilateral 04/16/2016   Procedure: LIPOSUCTION WITH LIPOFILLING TO BILATERAL CHEST;  Surgeon: Irene Limbo, MD;  Location: [place];  Service: Plastics;  Laterality: Bilateral;  . MASTECTOMY W/ SENTINEL NODE BIOPSY Bilateral 12/11/2015   Procedure: RIGHT PROPHYLACTIC MASTECTOMY, LEFT TOTAL MASTECTOMY WITH LEFT AXILLARY SENTINEL LYMPH NODE BIOPSY;  Surgeon: Stark Klein, MD;  Location: [place];  Service: General;  Laterality: Bilateral;  . OSTOMY N/A 11/19/2014   Procedure: OSTOMY;  Surgeon: Michael [name], MD;  [facility];  Service: General;  Laterality: N/A;  . PROCTOSCOPY N/A 04/01/2017   Procedure: RIDGE PROCTOSCOPY;  Surgeon: Michael [name], MD;  [facility];  Service: General;  Laterality: N/A;  . REMOVAL OF BILATERAL TISSUE EXPANDERS WITH PLACEMENT OF BILATERAL BREAST IMPLANTS Bilateral 04/16/2016   Procedure: REMOVAL OF BILATERAL TISSUE EXPANDERS WITH PLACEMENT OF BILATERAL BREAST IMPLANTS;  Surgeon: Irene Limbo, MD;  Location: [place];  Service: Plastics;  Laterality: Bilateral;  . ROBOTIC ASSISTED LAP VAGINAL HYSTERECTOMY N/A 11/19/2014   Procedure: ROBOTIC LYSIS OF ADHESIONS, CONVERTED TO LAPAROTOMY RADICAL UPPER VAGINECTOMY,LOW ANTERIOR BOWEL RESECTION, COLOSTOMY, BILATERAL URETERAL STENT  PLACEMENT AND CYSTONOMY CLOSURE;  Surgeon: Everitt Amber, MD;  [facility];  Service: Gynecology;  Laterality: N/A;  . TISSUE EXPANDER FILLING Bilateral 12/26/2015   Procedure: EXPANSION OF BILATERAL CHEST TISSUE EXPANDERS (60 mL- Right; 75 mL- Left);  Surgeon: Irene Limbo, MD;  Location: [place];  Service: Plastics;  Laterality: Bilateral;  . TONSILLECTOMY    . TOTAL ABDOMINAL HYSTERECTOMY  March 2006   [facility]   and Bilateral Salpingoophorectomy/  staging for Ovarian cancer/  an  . XI ROBOTIC ASSISTED LOWER ANTERIOR RESECTION N/A 04/01/2017   Procedure: XI ROBOTIC VS LAPAROSCOPIC COLOSTOMY TAKEDOWN WITH LYSIS OF ADHESIONS.;  Surgeon: Michael [name], MD;  [facility];  Service: General;  Laterality: N/A;  ERAS PATHWAY    SOCIAL HISTORY: Social History   Tobacco Use  . Smoking status: Never Smoker  . Smokeless tobacco: Never Used  Substance Use Topics  . Alcohol use: Not Currently  . Drug use: No    FAMILY HISTORY: Family History  Problem Relation Age of Onset  . Cancer Mother 39       stomach ca  . Hypertension Mother   . Cancer Father 6       prostate ca  . Diabetes Father   . Heart disease Father        CABG  . Hypertension Father   . Hyperlipidemia Father   . Obesity Father   . Breast cancer Maternal Aunt        dx  in her 81s  . Lymphoma Paternal Aunt   . Brain cancer Paternal Grandfather   . Ovarian cancer Other   . Diabetes Sister   . Hypertension Brother y-10  . Heart disease Brother        CABG  . Diabetes Brother     ROS: Review of Systems  Psychiatric/Behavioral: Positive for depression.  All other systems reviewed and are negative.   PHYSICAL EXAM: Blood pressure 100/68, pulse 64, temperature 97.6 F (36.4 C), height 5' 2"  (1.575 m), weight 199 lb (90.3 kg), SpO2 98 %. Body mass index is 36.4 kg/m. Physical Exam  Constitutional: She appears well-developed and well-nourished.  HENT:  Head: Normocephalic.  Eyes: Pupils  are equal, round, and reactive to light.  Neck: Normal range of motion.  Pulmonary/Chest: Effort normal.  Neurological: She is alert.  Skin: Skin is warm and dry.  Psychiatric: She has a normal mood and affect. Her behavior is normal.  Vitals reviewed.   RECENT LABS AND TESTS: BMET    Component Value Date/Time   NA 135 05/19/2018 1127   NA 141 01/24/2017 1228   K 4.3 05/19/2018 1127   K 4.6 01/24/2017 1228   CL 104 05/19/2018 1127   CO2 22 05/19/2018 1127   CO2 29 01/24/2017 1228   GLUCOSE 183 (H) 05/19/2018 1127   GLUCOSE 84 01/24/2017 1228   BUN 38 (H) 05/19/2018 1127   BUN 22.2 01/24/2017 1228   CREATININE 1.90 (H) 05/19/2018 1127   CREATININE 1.98 (H) 03/27/2018 1324   CREATININE 0.8 01/24/2017 1228   CALCIUM 9.6 05/19/2018 1127   CALCIUM 10.2 01/24/2017 1228   GFRNONAA 27 (L) 05/19/2018 1127   GFRNONAA 26 (L) 03/27/2018 1324   GFRNONAA 78 12/16/2014 1530   GFRAA 31 (L) 05/19/2018 1127   GFRAA 30 (L) 03/27/2018 1324   GFRAA >89 12/16/2014 1530   Lab Results  Component Value Date   HGBA1C 7.9 (H) 09/01/2017   HGBA1C 5.3 04/01/2017   HGBA1C 5.8 (H) 09/05/2015   HGBA1C 6.8 (H) 11/21/2014   HGBA1C 6.3 (H) 11/07/2014   No results found for: INSULIN CBC    Component Value Date/Time   WBC 4.4 05/19/2018 1127   RBC 3.78 (L) 05/19/2018 1127   HGB 10.4 (L) 05/19/2018 1127   HGB 10.2 (L) 03/27/2018 1324   HGB 12.4 07/29/2017 1444   HCT 35.1 (L) 05/19/2018 1127   HCT 38.0 07/29/2017 1444   PLT 350 05/19/2018 1127   PLT 268 03/27/2018 1324   PLT 260 07/29/2017 1444   MCV 92.9 05/19/2018 1127   MCV 90.9 07/29/2017 1444   MCH 27.5 05/19/2018 1127   MCHC 29.6 (L) 05/19/2018 1127   RDW 14.7 05/19/2018 1127   RDW 15.5 (H) 07/29/2017 1444   LYMPHSABS 0.7 05/19/2018 1127   LYMPHSABS 0.8 (L) 07/29/2017 1444   MONOABS 0.5 05/19/2018 1127   MONOABS 1.0 (H) 07/29/2017 1444   EOSABS 0.1 05/19/2018 1127   EOSABS 0.0 07/29/2017 1444   BASOSABS 0.1 05/19/2018 1127    BASOSABS 0.0 07/29/2017 1444   Iron/TIBC/Ferritin/ %Sat    Component Value Date/Time   IRON 7 (L) 08/31/2017 0451   IRON 12 (L) 11/29/2014 1251   TIBC 164 (L) 08/31/2017 0451   TIBC 331 11/29/2014 1251   FERRITIN 27 11/29/2014 1251   IRONPCTSAT 4 (L) 08/31/2017 0451   IRONPCTSAT 4 (L) 11/29/2014 1251   Lipid Panel     Component Value Date/Time   CHOL 109 08/28/2015 1617   TRIG  89 04/18/2017 0427   HDL 43.80 08/28/2015 1617   CHOLHDL 2 08/28/2015 1617   VLDL 19.8 08/28/2015 1617   LDLCALC 45 08/28/2015 1617   Hepatic Function Panel     Component Value Date/Time   PROT 8.5 (H) 05/19/2018 1127   PROT 6.9 01/24/2017 1228   ALBUMIN 2.8 (L) 05/19/2018 1127   ALBUMIN 3.4 (L) 01/24/2017 1228   AST 30 05/19/2018 1127   AST 14 (L) 03/27/2018 1324   AST 16 01/24/2017 1228   ALT 54 (H) 05/19/2018 1127   ALT 18 03/27/2018 1324   ALT 14 01/24/2017 1228   ALKPHOS 364 (H) 05/19/2018 1127   ALKPHOS 90 01/24/2017 1228   BILITOT 0.4 05/19/2018 1127   BILITOT 0.2 (L) 03/27/2018 1324   BILITOT 0.34 01/24/2017 1228      Component Value Date/Time   TSH 2.408 08/30/2017 0623   TSH 0.63 08/28/2015 1617    ASSESSMENT AND PLAN: Other depression  Class 2 severe obesity with serious comorbidity and body mass index (BMI) of 36.0 to 36.9 in adult, unspecified obesity type ([name])  PLAN: Depression with Emotional Eating Behaviors We discussed behavior modification techniques today to help [name] deal with her emotional eating and depression. Cognitive behavorial therapies to discuss emotional eating, comfort and support offered to the patient patient today. Will continue to follow, may refer to Dr Mallie Mussel, psychologist is mood worsens.    Obesity [name] is currently in the action stage of change. As such, her goal is to continue with weight loss efforts She has agreed to keep a food journal with 1200-1400 calories and 60-80 protein  [name] has been instructed to work up to a goal of 150  minutes of combined cardio and strengthening exercise per week for weight loss and overall health benefits. We discussed the following Behavioral Modification Stratagies today: decreasing simple carbohydrates , increasing vegetables and emotional eating strategies and increase her H20 intake.   I spent > than 50% of the 15 minute visit on counseling as documented in the note.  [name] has agreed to follow up with our clinic in 3 weeks. She was informed of the importance of frequent follow up visits to maximize her success with intensive lifestyle modifications for her multiple health conditions.   OBESITY BEHAVIORAL INTERVENTION VISIT  Today's visit was # 4   Starting weight: 208 lbs Starting date: 04/15/18 Today's weight : Weight: 199 lb (90.3 kg)  Today's date: 05/31/2018 Total lbs lost to date: 10    ASK: We discussed the diagnosis of obesity with [name] today and [name] agreed to give [place] permission to discuss obesity behavioral modification therapy today.  ASSESS: [name] has the diagnosis of obesity and her BMI today is 36.5. [name] is in the action stage of change   ADVISE: [name] was educated on the multiple health risks of obesity as well as the benefit of weight loss to improve her health. She was advised of the need for long term treatment and the importance of lifestyle modifications to improve her current health and to decrease her risk of future health problems.  AGREE: Multiple dietary modification options and treatment options were discussed and  [name] agreed to follow the recommendations documented in the above note.  ARRANGE: [name] was educated on the importance of frequent visits to treat obesity as outlined per CMS and USPSTF guidelines and agreed to schedule her next follow up appointment today.  I, April Moore, am acting as Location manager for Dr Dennard Nip.  I have reviewed the above documentation for accuracy and completeness, and I agree with the  above. -Dennard Nip, MD

## 2018-06-01 ENCOUNTER — Encounter (INDEPENDENT_AMBULATORY_CARE_PROVIDER_SITE_OTHER): Payer: Self-pay | Admitting: Family Medicine

## 2018-06-01 DIAGNOSIS — T82594D Other mechanical complication of infusion catheter, subsequent encounter: Secondary | ICD-10-CM | POA: Diagnosis not present

## 2018-06-02 ENCOUNTER — Other Ambulatory Visit (INDEPENDENT_AMBULATORY_CARE_PROVIDER_SITE_OTHER): Payer: BLUE CROSS/BLUE SHIELD

## 2018-06-02 DIAGNOSIS — R319 Hematuria, unspecified: Secondary | ICD-10-CM | POA: Diagnosis not present

## 2018-06-02 DIAGNOSIS — L89309 Pressure ulcer of unspecified buttock, unspecified stage: Secondary | ICD-10-CM | POA: Diagnosis not present

## 2018-06-02 DIAGNOSIS — N390 Urinary tract infection, site not specified: Secondary | ICD-10-CM | POA: Diagnosis not present

## 2018-06-02 DIAGNOSIS — E860 Dehydration: Secondary | ICD-10-CM | POA: Diagnosis not present

## 2018-06-02 DIAGNOSIS — E785 Hyperlipidemia, unspecified: Secondary | ICD-10-CM | POA: Diagnosis not present

## 2018-06-02 LAB — POC URINALSYSI DIPSTICK (AUTOMATED)
BILIRUBIN UA: NEGATIVE
Glucose, UA: NEGATIVE
Ketones, UA: NEGATIVE
Protein, UA: POSITIVE — AB
SPEC GRAV UA: 1.02 (ref 1.010–1.025)
Urobilinogen, UA: 0.2 E.U./dL
pH, UA: 6 (ref 5.0–8.0)

## 2018-06-02 IMAGING — CT CT ABD-PELV W/ CM
2 of 5 series · 16 of 46 positions shown, 18 images · IV contrast (ISOVUE)
Comparison: 05/03/2017

CLINICAL DATA: Fever chills, back pain.

EXAM:
CT ABDOMEN AND PELVIS WITH CONTRAST
TECHNIQUE: Multidetector CT imaging of the abdomen and pelvis was performed
using the standard protocol following bolus administration of
intravenous contrast.
CONTRAST:  100mL [id number] IOPAMIDOL (H53W66-HII) INJECTION 61%

[Series 2: abd/pel with · axial · 0.97mm/px · z∈[-471,-81]mm · 13 of 90 slices shown, 15 images]
[im 6/90  soft-tissue]
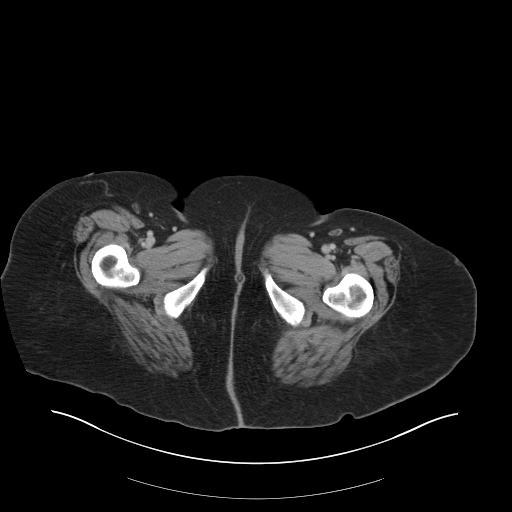
[im 6/90  bone]
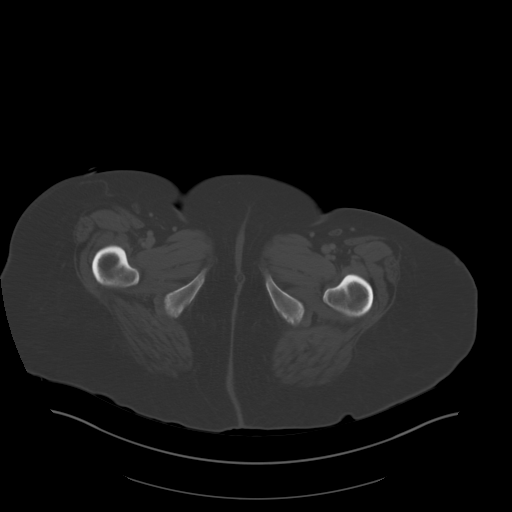
[im 12/90  soft-tissue]
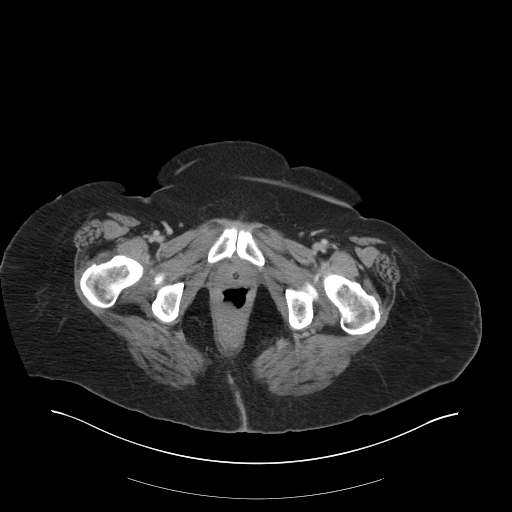
[im 18/90  soft-tissue]
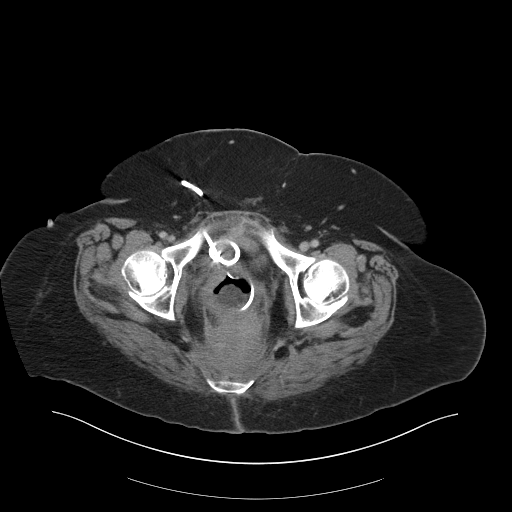
[im 24/90  soft-tissue]
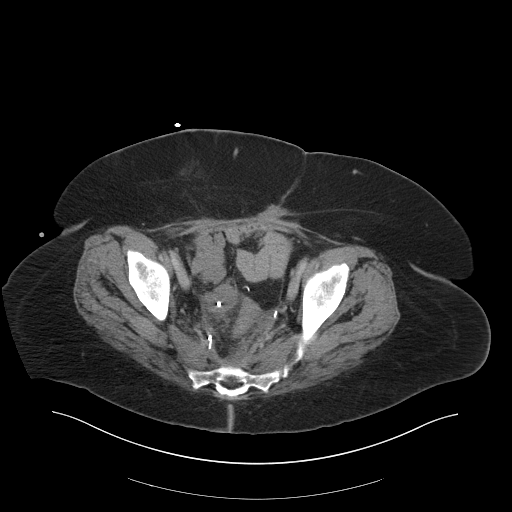
[im 30/90  soft-tissue]
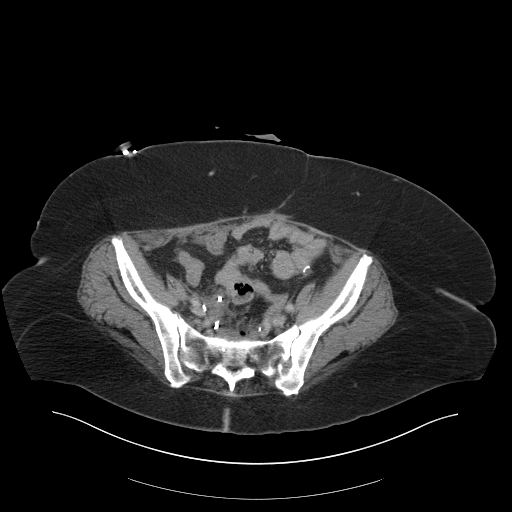
[im 36/90  soft-tissue]
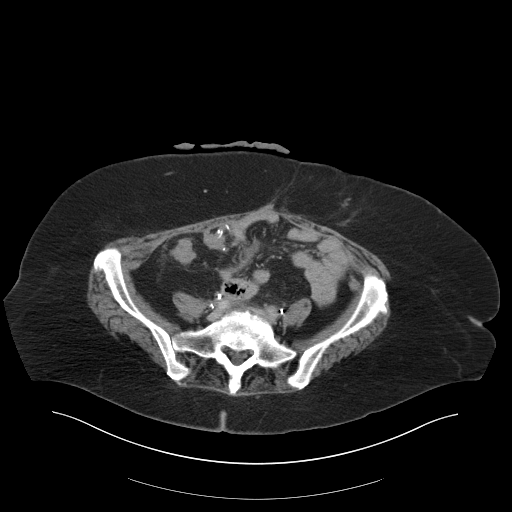
[im 48/90  soft-tissue]
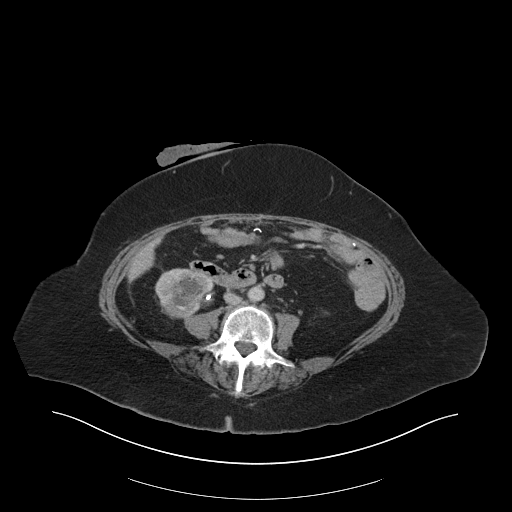
[im 54/90  soft-tissue]
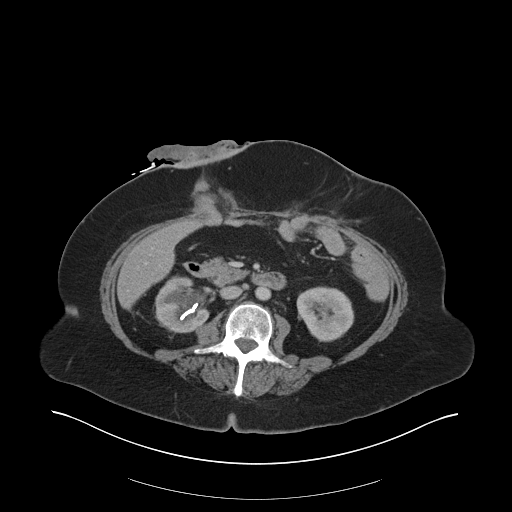
[im 60/90  soft-tissue]
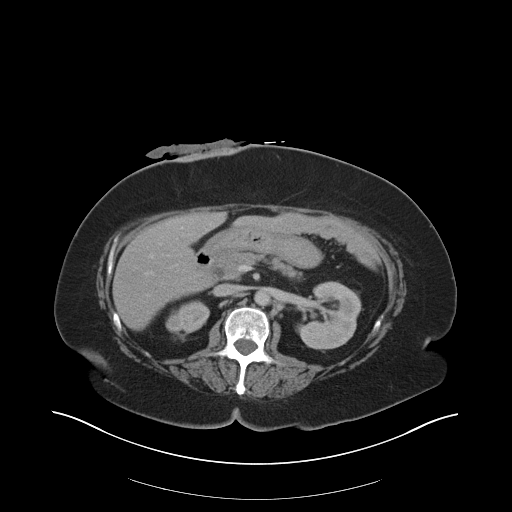
[im 60/90  bone]
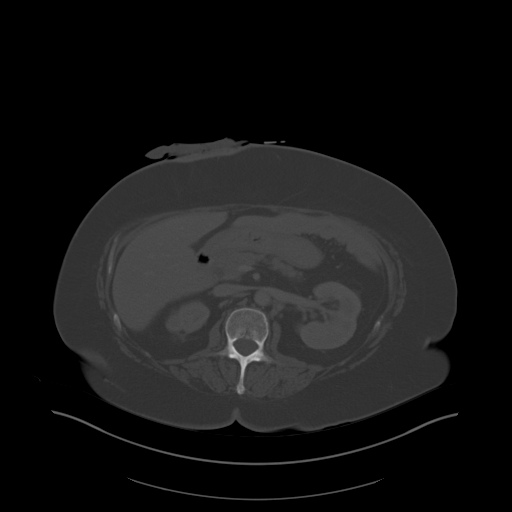
[im 66/90  soft-tissue]
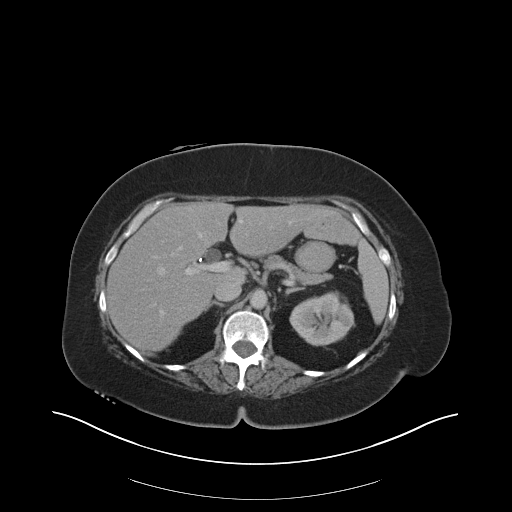
[im 72/90  soft-tissue]
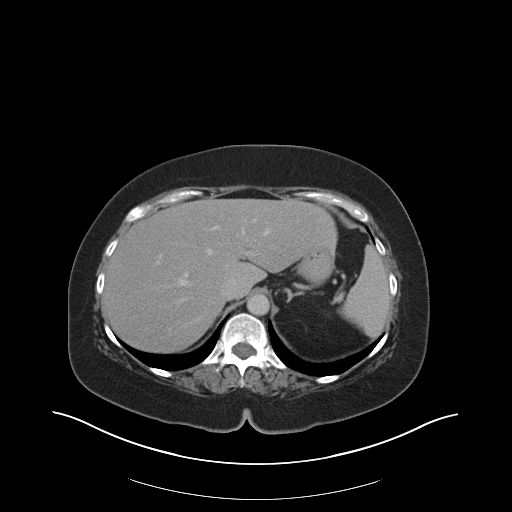
[im 78/90  soft-tissue]
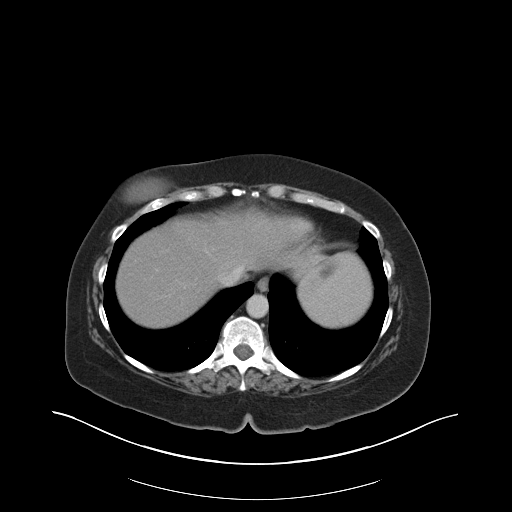
[im 84/90  soft-tissue]
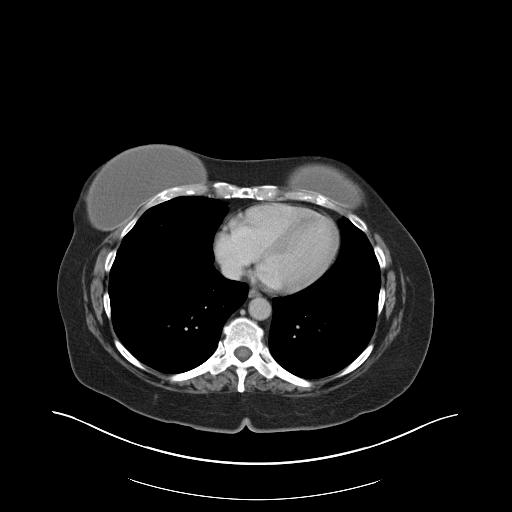

[Series 4: coronal a/|p · coronal · 0.74mm/px · 3 of 145 slices shown]
[im 49/145  soft-tissue]
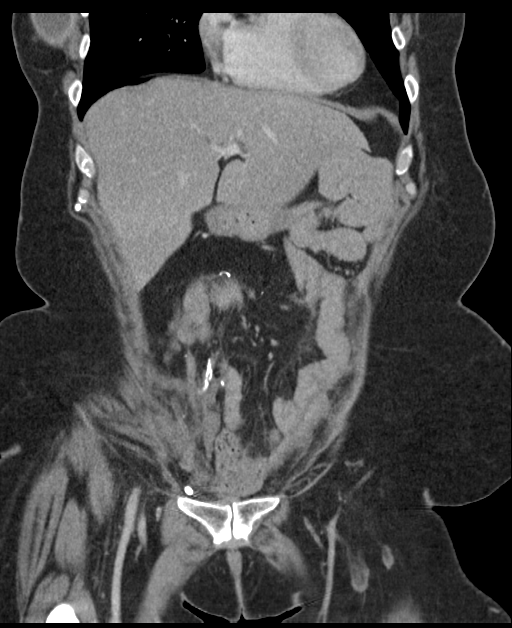
[im 65/145  soft-tissue]
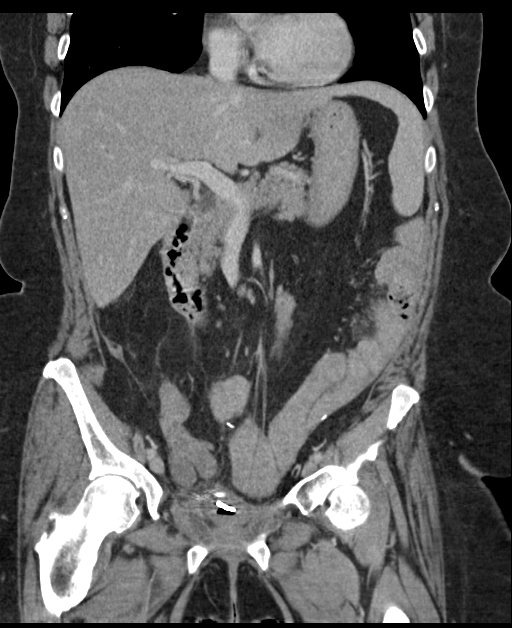
[im 81/145  soft-tissue]
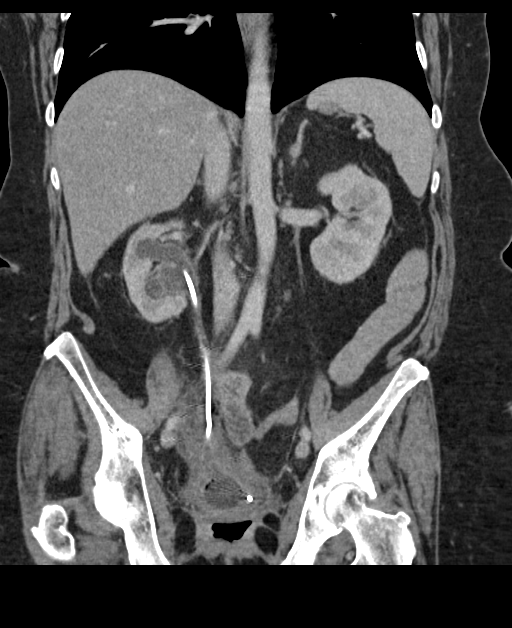

[16 of 46 positions shown; findings below may reference images not displayed]

FINDINGS: Lower chest: No acute abnormality.  Intact breast implants.

Hepatobiliary: No focal liver abnormality is seen. Status post
cholecystectomy. No biliary dilatation.

Pancreas: Unremarkable. No pancreatic ductal dilatation or
surrounding inflammatory changes.

Spleen: Normal in size without focal abnormality.

Adrenals/Urinary Tract: Normal adrenal glands. Normal appearance of
the left kidney. Moderate right hydronephrosis. Double-J ureteral
catheter with proximal tip in the renal pelvis and distal tip in the
expected location of the urinary bladder. Air within the urinary
bladder. Diffuse wall thickening of the bladder which is collapsed
around urinary Foley.

Stomach/Bowel: Stable postsurgical changes from colectomy with
persistent soft tissue thickening and fluid in the presacral region.
Patent ileostomy.

Vascular/Lymphatic: No evidence of discrete lymphadenopathy.

Reproductive: Post hysterectomy.  No adnexal masses.

Other: Surgical drain in the pelvis anterior to the urinary bladder.
No persistent fluid collections seen at this site.

Musculoskeletal: No acute or significant osseous findings.
IMPRESSION: No significant changes in the appearance of the abdomen with
residual soft tissue thickening versus organized fluid collection in
the presacral region.

Intact ileostomy.  No evidence of small-bowel obstruction.

Persistent right hydronephrosis with right ureteral catheter in
place. Gas within the urinary bladder likely due to instrumentation.
Diffuse wall thickening of the urinary bladder.

## 2018-06-05 LAB — URINE CULTURE
MICRO NUMBER: 91317588
SPECIMEN QUALITY: ADEQUATE

## 2018-06-07 DIAGNOSIS — T82594D Other mechanical complication of infusion catheter, subsequent encounter: Secondary | ICD-10-CM | POA: Diagnosis not present

## 2018-06-08 ENCOUNTER — Encounter: Payer: Self-pay | Admitting: Family Medicine

## 2018-06-08 NOTE — Telephone Encounter (Signed)
She will always have bacteria while she has the catheter in--- if she is not having symptoms  I really dont' want to give more antibiotics  --    She really needs to f/u with the urologist ----- we can help with that --- does she want [place] to try [facility] or the urologist here?

## 2018-06-08 NOTE — Telephone Encounter (Signed)
See her telephone note in care everywhere ----  Call urology office and let them know we willl fax urine to their office.  Pt is asymptomatic so we did not treat but did tell the pt to f/u with them  Pt is very nervous about return of the uti

## 2018-06-08 NOTE — Telephone Encounter (Signed)
Please call urology office and let them know we are faxing her results ---- and to give them to the urologist She is asymptomatic right now so we did not treat but I do feel she needs to f/u with the urologist

## 2018-06-09 ENCOUNTER — Encounter: Payer: Self-pay | Admitting: *Deleted

## 2018-06-12 DIAGNOSIS — Z933 Colostomy status: Secondary | ICD-10-CM | POA: Diagnosis not present

## 2018-06-13 NOTE — Telephone Encounter (Signed)
Faxed results to urology. Called office and advised them of patient concerns.  They stated that patient needed to call the nurses line [phone] opt #3, then opt #1)

## 2018-06-14 DIAGNOSIS — T82594D Other mechanical complication of infusion catheter, subsequent encounter: Secondary | ICD-10-CM | POA: Diagnosis not present

## 2018-06-14 DIAGNOSIS — N182 Chronic kidney disease, stage 2 (mild): Secondary | ICD-10-CM | POA: Diagnosis not present

## 2018-06-14 DIAGNOSIS — E860 Dehydration: Secondary | ICD-10-CM | POA: Diagnosis not present

## 2018-06-15 DIAGNOSIS — E860 Dehydration: Secondary | ICD-10-CM | POA: Diagnosis not present

## 2018-06-18 ENCOUNTER — Encounter: Payer: Self-pay | Admitting: Family Medicine

## 2018-06-19 ENCOUNTER — Other Ambulatory Visit: Payer: Self-pay | Admitting: Family Medicine

## 2018-06-19 DIAGNOSIS — C55 Malignant neoplasm of uterus, part unspecified: Secondary | ICD-10-CM

## 2018-06-19 DIAGNOSIS — T82594D Other mechanical complication of infusion catheter, subsequent encounter: Secondary | ICD-10-CM | POA: Diagnosis not present

## 2018-06-19 MED ORDER — FENTANYL 50 MCG/HR TD PT72: 50.0000 ug | MEDICATED_PATCH | TRANSDERMAL | 0 refills | Status: DC

## 2018-06-21 ENCOUNTER — Other Ambulatory Visit (HOSPITAL_COMMUNITY): Payer: Self-pay | Admitting: Interventional Radiology

## 2018-06-21 ENCOUNTER — Ambulatory Visit (HOSPITAL_COMMUNITY)
Admission: RE | Admit: 2018-06-21 | Discharge: 2018-06-21 | Disposition: A | Discharge status: Home / Self Care | Payer: BLUE CROSS/BLUE SHIELD | Source: Ambulatory Visit | Attending: Interventional Radiology | Admitting: Interventional Radiology

## 2018-06-21 ENCOUNTER — Encounter (HOSPITAL_COMMUNITY): Payer: Self-pay | Admitting: Interventional Radiology

## 2018-06-21 DIAGNOSIS — C541 Malignant neoplasm of endometrium: Secondary | ICD-10-CM | POA: Insufficient documentation

## 2018-06-21 DIAGNOSIS — Z436 Encounter for attention to other artificial openings of urinary tract: Secondary | ICD-10-CM | POA: Diagnosis not present

## 2018-06-21 DIAGNOSIS — C569 Malignant neoplasm of unspecified ovary: Secondary | ICD-10-CM | POA: Insufficient documentation

## 2018-06-21 DIAGNOSIS — T82594D Other mechanical complication of infusion catheter, subsequent encounter: Secondary | ICD-10-CM | POA: Diagnosis not present

## 2018-06-21 DIAGNOSIS — T83592A Infection and inflammatory reaction due to indwelling ureteral stent, initial encounter: Secondary | ICD-10-CM | POA: Diagnosis not present

## 2018-06-21 HISTORY — PX: IR NEPHROSTOMY EXCHANGE RIGHT: IMG6070

## 2018-06-21 HISTORY — PX: IR NEPHROSTOMY EXCHANGE LEFT: IMG6069

## 2018-06-21 MED ORDER — IOPAMIDOL (ISOVUE-300) INJECTION 61%
50.0000 mL | Freq: Once | INTRAVENOUS | Status: AC | PRN
  Administered 2018-06-21: 20 mL

## 2018-06-21 MED ORDER — IOPAMIDOL (ISOVUE-300) INJECTION 61%
INTRAVENOUS | Status: AC
  Administered 2018-06-21: 20 mL
  Filled 2018-06-21: qty 50

## 2018-06-21 NOTE — Procedures (Signed)
Pre Procedure Dx: Hydronephrosis Post Procedure Dx: Same  Successful bilateral PCN exchange.   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   Ronny Bacon, MD Pager #: [phone]

## 2018-06-22 ENCOUNTER — Ambulatory Visit (INDEPENDENT_AMBULATORY_CARE_PROVIDER_SITE_OTHER): Payer: BLUE CROSS/BLUE SHIELD | Admitting: Family Medicine

## 2018-06-22 ENCOUNTER — Ambulatory Visit (INDEPENDENT_AMBULATORY_CARE_PROVIDER_SITE_OTHER): Payer: Self-pay | Admitting: Family Medicine

## 2018-06-22 ENCOUNTER — Encounter (INDEPENDENT_AMBULATORY_CARE_PROVIDER_SITE_OTHER): Payer: Self-pay | Admitting: Family Medicine

## 2018-06-22 VITALS — BP 112/73 | HR 74 | Temp 98.8°F | Ht 62.0 in | Wt 200.0 lb

## 2018-06-22 DIAGNOSIS — Z794 Long term (current) use of insulin: Secondary | ICD-10-CM | POA: Diagnosis not present

## 2018-06-22 DIAGNOSIS — E119 Type 2 diabetes mellitus without complications: Secondary | ICD-10-CM | POA: Diagnosis not present

## 2018-06-22 DIAGNOSIS — Z6836 Body mass index (BMI) 36.0-36.9, adult: Secondary | ICD-10-CM | POA: Diagnosis not present

## 2018-06-23 ENCOUNTER — Other Ambulatory Visit (HOSPITAL_COMMUNITY): Payer: Self-pay

## 2018-06-26 NOTE — Progress Notes (Signed)
Office: [phone]  /  Fax: [phone]   HPI:   Chief Complaint: OBESITY [name] is here to discuss her progress with her obesity treatment plan. She is on the keep a food journal with 1200-1400 calories and 60-80 grams of protein daily and is following her eating plan approximately 95 % of the time. She states she is exercising 0 minutes 0 times per week. [name]'s weight is up due to increased IV fluids and H20 retention. She is discouraged that her weight loss isn't better, but also due to her severe and ongoing health issues. She would like to look at other meal options.  Her weight is 200 lb (90.7 kg) today and has gained 1 pound since her last visit. She has lost 8 lbs since starting treatment with [place].  Diabetes II [name] has a diagnosis of diabetes type II. [name] states fasting BGs range between 75 and 120, and 2 hour post prandials range between 78 and 140's. She is on Lantus 8 units BID. She denies hypoglycemia. Last A1c was 7.9. She is working on following her diet prescription, and intensive lifestyle modifications including exercise, and weight loss to help control her blood glucose levels.  ALLERGIES: Allergies  Allergen Reactions  . Penicillins Swelling    Facial swelling/childhood allergy Has patient had a PCN reaction causing immediate rash, facial/tongue/throat swelling, SOB or lightheadedness with hypotension: Yes Has patient had a PCN reaction causing severe rash involving mucus membranes or skin necrosis: Yes Has patient had a PCN reaction that required hospitalization yes Has patient had a PCN reaction occurring within the last 10 years: No If all of the above answers are "NO", then may proceed with Cephalosporin use.   . Cefaclor Rash    Ceclor  . Erythromycin Other (See Comments)    Gastritis, abd cramps  . Tape Rash    blisters  . Trimethoprim Rash  . Ultram [Tramadol] Hives  . Cephalosporins Rash  . Fluconazole Rash  . Oxycodone Other (See Comments)    "  I just feel weird"  . Pectin Rash    Pectin ring for stoma  . Septra [Sulfamethoxazole-Trimethoprim] Rash  . Sulfa Antibiotics Rash    MEDICATIONS: Current Outpatient Medications on File Prior to Visit  Medication Sig Dispense Refill  . acetaminophen (TYLENOL) 325 MG tablet Take 2 tablets (650 mg total) by mouth every 6 (six) hours as needed. 30 tablet 1  . anastrozole (ARIMIDEX) 1 MG tablet TAKE 1 TABLET DAILY (Patient taking differently: Take 1 mg by mouth daily. ) 90 tablet 3  . Biotin 5 MG TABS Take 5 mg by mouth every morning.     . cefdinir (OMNICEF) 300 MG capsule Take 1 capsule by mouth 2 (two) times daily.  0  . Cholecalciferol (VITAMIN D3) 10000 UNITS capsule Take 10,000 Units by mouth once a week. Sunday evening's    . clobetasol (OLUX) 0.05 % topical foam Apply topically 2 (two) times daily.    . collagenase (SANTYL) ointment Apply 1 application topically daily as needed (skin care).     . diphenhydrAMINE (BENADRYL) 25 mg capsule Take 1 capsule (25 mg total) by mouth every 8 (eight) hours as needed for itching, allergies or sleep. 30 capsule 0  . diphenoxylate-atropine (LOMOTIL) 2.5-0.025 MG tablet Take 1 tablet by mouth 4 (four) times daily as needed for diarrhea or loose stools. TO PREVENT LOOSE BOWEL MOVEMENTS (Patient taking differently: Take 1 tablet by mouth every other day. TO PREVENT LOOSE BOWEL MOVEMENTS) 30 tablet 0  .  fentaNYL (DURAGESIC) 50 MCG/HR Place 1 patch (50 mcg total) onto the skin every 3 (three) days. 10 patch 0  . ferrous sulfate 325 (65 FE) MG tablet Take 1 tablet (325 mg total) by mouth at bedtime. 30 tablet 3  . fosfomycin (MONUROL) 3 g PACK Take 3 g by mouth every other day. 9 g 0  . HYDROcodone-acetaminophen (NORCO/VICODIN) 5-325 MG tablet Take 1 tablet by mouth every 6 (six) hours as needed for moderate pain. 30 tablet 0  . insulin glargine (LANTUS) 100 UNIT/ML injection Inject 6 Units into the skin 2 (two) times daily.     [name] Kitchen JANUVIA 50 MG tablet Take  50 mg by mouth daily.     [name] Kitchen levothyroxine (SYNTHROID, LEVOTHROID) 150 MCG tablet Take 1 tablet (150 mcg total) by mouth daily before breakfast. 30 tablet 1  . loratadine (CLARITIN) 10 MG tablet Take 10 mg by mouth every morning.     [name] Kitchen LYRICA 50 MG capsule TAKE 1 CAPSULE(50 MG) BY MOUTH TWICE DAILY (Patient taking differently: Take 50 mg by mouth 2 (two) times daily. ) 180 capsule 0  . nitrofurantoin, macrocrystal-monohydrate, (MACROBID) 100 MG capsule Take 100 mg by mouth at bedtime.    [name] Kitchen omega-3 acid ethyl esters (LOVAZA) 1 G capsule Take 1 g by mouth 2 (two) times daily.     [name] Glycol-Propyl Glycol (SYSTANE OP) Place 1 drop into both eyes daily as needed (dry eyes).     . Prenatal Vit-Fe Fumarate-FA (PRENATAL VITAMIN PO) Take 1 capsule by mouth daily. Takes prenatal because there are no dyes in it    . protein supplement shake (PREMIER PROTEIN) LIQD Take 325 mLs (11 oz total) by mouth 2 (two) times daily as needed (Pt to request). 60 Can 1  . rosuvastatin (CRESTOR) 10 MG tablet Take 10 mg by mouth every evening.     . Saccharomyces boulardii (FLORASTOR PO) Take 1 capsule by mouth daily.    . sodium bicarbonate 650 MG tablet Take 650 mg by mouth 2 (two) times daily.    . sodium chloride 0.9 % injection Inject 10 mLs into the vein daily.    . Tbo-Filgrastim (GRANIX) 480 MCG/0.8ML SOSY injection Inject 0.8 mLs (480 mcg total) into the skin daily. (Patient taking differently: Inject 480 mcg into the skin See admin instructions. Every 6 days) 30 Syringe 11   No current facility-administered medications on file prior to visit.     PAST MEDICAL HISTORY: Past Medical History:  Diagnosis Date  . Adrenal adenoma, left 02/08/2016   CT: stable benign  . Anemia in neoplastic disease   . Back pain   . Benign essential HTN   . 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 Cancer of corpus uteri, except isthmus [facility])  oncologist-- dr Denman [name] and dr Alvy Bimler    10-15-2004  dx endometroid endometrial and ovarian cancer s/p  chemotheapy and surgery(TAH w/ BSO) :  recurrent 11-19-2014 post pelvic surgery and radiation 01-29-2015 to 03-10-2015  . Chronic idiopathic neutropenia (HCC)    presumed related to chemotherapy March 2006--- followed by dr Alvy Bimler (treatment w/ G-CSF injections  . Chronic nausea   . Chronic pain    perineal/ anal  area from bladder pad irritates skin , right flank pain  . CKD stage G2/A3, GFR 60-89 and albumin creatinine  ratio >300 mg/g    nephrologist-  dr Madelon Lips  . Colovesical fistula   . Diabetic retinopathy, background ([place])   . Difficult intravenous access    small veins--- hx PICC lines  . DM type 2 (diabetes mellitus, type 2) ([place])    monitored by dr Legrand Como altheimer  . Dysuria   . Environmental and seasonal allergies   . Fatty liver 02/08/2016   CT  . Generalized muscle weakness   . GERD (gastroesophageal reflux disease)   . Hiatal hernia   . History of abdominal abscess 04/16/2017   post surgery 04-01-2017  --- resolved 10/ 2018  . History of gastric polyp    2014  duodenum  . History of ileus 04/16/2017   resolved w/ no surgical intervention  . History of radiation therapy    01-29-2015 to 03-10-2015  pelvis 50.4Gy  . Hypothyroidism    monitored by dr Legrand Como altheimer  . IBS (irritable bowel syndrome)   . Ileostomy in place [facility]) 04/01/2017   created at same time colostomy takedown.  . Joint pain   . Leg edema   . Lower urinary tract symptoms (LUTS)    urge urinary  incontinence  . Mixed dyslipidemia   . Multiple thyroid nodules    Managed by Dr. Harlow Asa  . Nephrostomy status ([place])   . Palpitations   . Pelvic abscess in female 04/16/2017  . PONV (postoperative nausea and vomiting)    "scopolamine patch works for me"  . Radiation-induced  dermatitis    contact dermatitis , radiation completed, rash only on ankles now.  . Seasonal allergies   . Ureteral stricture, right UROLOGIT-  DR [facility]   CHRONIC--  TREATMENT URETERAL STENT  . 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 Vitamin D deficiency   . Wears glasses     PAST SURGICAL HISTORY: Past Surgical History:  Procedure Laterality Date  . APPENDECTOMY    . biopsy thyroid nodules    . BREAST RECONSTRUCTION WITH PLACEMENT OF TISSUE EXPANDER AND FLEX HD (ACELLULAR HYDRATED DERMIS) Bilateral 12/11/2015   Procedure: BILATERAL BREAST RECONSTRUCTION WITH PLACEMENT OF TISSUE EXPANDERS;  Surgeon: Irene Limbo, MD;  Location: [place];  Service: Plastics;  Laterality: Bilateral;  . COLONOSCOPY WITH PROPOFOL N/A 08/21/2013   Procedure: COLONOSCOPY WITH PROPOFOL;  Surgeon: Cleotis Nipper, MD;  [facility];  Service: Endoscopy;  Laterality: N/A;  . COLOSTOMY TAKEDOWN N/A 12/04/2014   Procedure: LAPROSCOPIC LYSIS OF ADHESIONS, SPLENIC MOBILIZATION, RELOCATION OF COLOSTOMY, DEBRIDEMENT INITIAL COLOSTOMY SITE;  Surgeon: Michael [name], MD;  [facility];  Service: General;  Laterality: N/A;  . CYSTOGRAM N/A 06/01/2017   Procedure: CYSTOGRAM;  Surgeon: Alexis Frock, MD;  [facility];  Service: Urology;  Laterality: N/A;  . CYSTOSCOPY W/ RETROGRADES Right 11/21/2015   Procedure: CYSTOSCOPY WITH RETROGRADE PYELOGRAM;  Surgeon: Alexis Frock, MD;  [facility];  Service: Urology;  Laterality: Right;  . CYSTOSCOPY W/ URETERAL STENT PLACEMENT Right 11/21/2015   Procedure: CYSTOSCOPY WITH STENT REPLACEMENT;  Surgeon: Alexis Frock, MD;  [facility];  Service: Urology;  Laterality: Right;  . CYSTOSCOPY W/ URETERAL STENT PLACEMENT Right 03/10/2016   Procedure: CYSTOSCOPY WITH STENT REPLACEMENT;  Surgeon: Alexis Frock, MD;  Location[facility];  Service: Urology;  Laterality: Right;  . CYSTOSCOPY W/ URETERAL STENT PLACEMENT Right 06/30/2016    Procedure: CYSTOSCOPY WITH RETROGRADE PYELOGRAM/URETERAL STENT EXCHANGE;  Surgeon: Alexis Frock, MD;  Location: [facility];  Service: Urology;  Laterality: Right;  .  CYSTOSCOPY W/ URETERAL STENT PLACEMENT N/A 06/01/2017   Procedure: CYSTOSCOPY WITH EXAM UNDER ANESTHESIA;  Surgeon: Alexis Frock, MD;  [facility];  Service: Urology;  Laterality: N/A;  . CYSTOSCOPY W/ URETERAL STENT PLACEMENT Right 08/17/2017   Procedure: CYSTOSCOPY WITH RETROGRADE PYELOGRAM/URETERAL STENT REMOVAL;  Surgeon: Alexis Frock, MD;  Location: [facility];  Service: Urology;  Laterality: Right;  . CYSTOSCOPY WITH RETROGRADE PYELOGRAM, URETEROSCOPY AND STENT PLACEMENT Right 03/20/2015   Procedure: CYSTOSCOPY WITH RETROGRADE PYELOGRAM, URETEROSCOPY WITH BALLOON DILATION AND STENT PLACEMENT ON RIGHT;  Surgeon: Alexis Frock, MD;  Location: [facility];  Service: Urology;  Laterality: Right;  . CYSTOSCOPY WITH RETROGRADE PYELOGRAM, URETEROSCOPY AND STENT PLACEMENT Right 05/02/2015   Procedure: CYSTOSCOPY WITH RIGHT RETROGRADE PYELOGRAM,  DIAGNOSTIC URETEROSCOPY AND STENT PULL ;  Surgeon: Alexis Frock, MD;  Location[facility];  Service: Urology;  Laterality: Right;  . CYSTOSCOPY WITH RETROGRADE PYELOGRAM, URETEROSCOPY AND STENT PLACEMENT Right 09/05/2015   Procedure: CYSTOSCOPY WITH RETROGRADE PYELOGRAM,  AND STENT PLACEMENT;  Surgeon: Alexis Frock, MD;  [facility];  Service: Urology;  Laterality: Right;  . CYSTOSCOPY WITH RETROGRADE PYELOGRAM, URETEROSCOPY AND STENT PLACEMENT Right 04/01/2017   Procedure: CYSTOSCOPY WITH RETROGRADE PYELOGRAM, URETEROSCOPY AND STENT PLACEMENT;  Surgeon: Alexis Frock, MD;  [facility];  Service: Urology;  Laterality: Right;  . CYSTOSCOPY WITH STENT PLACEMENT Right 10/27/2016   Procedure: CYSTOSCOPY WITH STENT CHANGE and right retrograde pyelogram;  Surgeon: Alexis Frock, MD;  Location: [facility];  Service: Urology;  Laterality: Right;  . EUS N/A 10/02/2014   Procedure: LOWER ENDOSCOPIC ULTRASOUND (EUS);  Surgeon: Arta Silence, MD;  Location[facility];  Service: Endoscopy;  Laterality: N/A;  . EXCISION SOFT TISSUE MASS RIGHT FOREMAN  12-08-2006  . EYE SURGERY  as child   pytosis of eyelids repair  . INCISION AND DRAINAGE OF WOUND Bilateral 12/26/2015   Procedure: DEBRIDEMENT OF BILATERAL MASTECTOMY FLAPS;  Surgeon: Irene Limbo, MD;  Location: [place];  Service: Plastics;  Laterality: Bilateral;  . IR CV LINE INJECTION  05/31/2017  . IR FLUORO GUIDE CV LINE LEFT  05/31/2017  . IR FLUORO GUIDE CV LINE RIGHT  04/06/2017  . IR FLUORO GUIDE CV MIDLINE PICC RIGHT  05/30/2017  . IR NEPHROSTOGRAM LEFT INITIAL PLACEMENT  09/02/2017  . IR NEPHROSTOGRAM LEFT THRU EXISTING ACCESS  11/29/2017  . IR NEPHROSTOGRAM RIGHT INITIAL PLACEMENT  09/02/2017  . IR NEPHROSTOGRAM RIGHT THRU EXISTING ACCESS  09/13/2017  . IR NEPHROSTOGRAM RIGHT THRU EXISTING ACCESS  11/29/2017  . IR NEPHROSTOMY EXCHANGE LEFT  11/28/2017  . IR NEPHROSTOMY EXCHANGE LEFT  01/05/2018  . IR NEPHROSTOMY EXCHANGE LEFT  02/16/2018  . IR NEPHROSTOMY EXCHANGE LEFT  03/30/2018  . IR NEPHROSTOMY EXCHANGE LEFT  05/12/2018  . IR NEPHROSTOMY EXCHANGE LEFT  06/21/2018  . IR NEPHROSTOMY EXCHANGE RIGHT  10/02/2017  . IR NEPHROSTOMY EXCHANGE RIGHT  11/28/2017  . IR NEPHROSTOMY EXCHANGE RIGHT  01/05/2018  . IR NEPHROSTOMY EXCHANGE RIGHT  02/16/2018  . IR NEPHROSTOMY EXCHANGE RIGHT  03/30/2018  . IR NEPHROSTOMY EXCHANGE RIGHT  05/12/2018  . IR NEPHROSTOMY EXCHANGE RIGHT  06/21/2018  . IR NEPHROSTOMY PLACEMENT LEFT  10/02/2017  . IR RADIOLOGIST EVAL & MGMT  05/03/2017  . IR US GUIDE VASC ACCESS LEFT  05/31/2017  . IR US GUIDE VASC ACCESS RIGHT  04/06/2017  . IR US GUIDE VASC ACCESS RIGHT  05/30/2017  . LAPAROSCOPIC CHOLECYSTECTOMY  1990  . LIPOSUCTION WITH LIPOFILLING  Bilateral 04/16/2016   Procedure: LIPOSUCTION WITH LIPOFILLING  TO BILATERAL CHEST;  Surgeon: Irene Limbo, MD;  Location: [place];  Service: Plastics;  Laterality: Bilateral;  . MASTECTOMY W/ SENTINEL NODE BIOPSY Bilateral 12/11/2015   Procedure: RIGHT PROPHYLACTIC MASTECTOMY, LEFT TOTAL MASTECTOMY WITH LEFT AXILLARY SENTINEL LYMPH NODE BIOPSY;  Surgeon: Stark Klein, MD;  Location: [place];  Service: General;  Laterality: Bilateral;  . OSTOMY N/A 11/19/2014   Procedure: OSTOMY;  Surgeon: Michael [name], MD;  [facility];  Service: General;  Laterality: N/A;  . PROCTOSCOPY N/A 04/01/2017   Procedure: RIDGE PROCTOSCOPY;  Surgeon: Michael [name], MD;  [facility];  Service: General;  Laterality: N/A;  . REMOVAL OF BILATERAL TISSUE EXPANDERS WITH PLACEMENT OF BILATERAL BREAST IMPLANTS Bilateral 04/16/2016   Procedure: REMOVAL OF BILATERAL TISSUE EXPANDERS WITH PLACEMENT OF BILATERAL BREAST IMPLANTS;  Surgeon: Irene Limbo, MD;  Location: [place];  Service: Plastics;  Laterality: Bilateral;  . ROBOTIC ASSISTED LAP VAGINAL HYSTERECTOMY N/A 11/19/2014   Procedure: ROBOTIC LYSIS OF ADHESIONS, CONVERTED TO LAPAROTOMY RADICAL UPPER VAGINECTOMY,LOW ANTERIOR BOWEL RESECTION, COLOSTOMY, BILATERAL URETERAL STENT PLACEMENT AND CYSTONOMY CLOSURE;  Surgeon: Everitt Amber, MD;  [facility];  Service: Gynecology;  Laterality: N/A;  . TISSUE EXPANDER FILLING Bilateral 12/26/2015   Procedure: EXPANSION OF BILATERAL CHEST TISSUE EXPANDERS (60 mL- Right; 75 mL- Left);  Surgeon: Irene Limbo, MD;  Location: [place];  Service: Plastics;  Laterality: Bilateral;  . TONSILLECTOMY    . TOTAL ABDOMINAL HYSTERECTOMY  March 2006   [facility]   and Bilateral Salpingoophorectomy/  staging for Ovarian cancer/  an  . XI ROBOTIC ASSISTED LOWER ANTERIOR RESECTION N/A 04/01/2017   Procedure: XI ROBOTIC VS LAPAROSCOPIC COLOSTOMY TAKEDOWN WITH LYSIS OF ADHESIONS.;  Surgeon: Michael [name], MD;  [facility];  Service: General;   Laterality: N/A;  ERAS PATHWAY    SOCIAL HISTORY: Social History   Tobacco Use  . Smoking status: Never Smoker  . Smokeless tobacco: Never Used  Substance Use Topics  . Alcohol use: Not Currently  . Drug use: No    FAMILY HISTORY: Family History  Problem Relation Age of Onset  . Cancer Mother 13       stomach ca  . Hypertension Mother   . Cancer Father 59       prostate ca  . Diabetes Father   . Heart disease Father        CABG  . Hypertension Father   . Hyperlipidemia Father   . Obesity Father   . Breast cancer Maternal Aunt        dx in her 5s  . Lymphoma Paternal Aunt   . Brain cancer Paternal Grandfather   . Ovarian cancer Other   . Diabetes Sister   . Hypertension Brother y-10  . Heart disease Brother        CABG  . Diabetes Brother     ROS: Review of Systems  Constitutional: Positive for weight loss.  Endo/Heme/Allergies:       Negative hypoglycemia    PHYSICAL EXAM: Blood pressure 112/73, pulse 74, temperature 98.8 F (37.1 C), temperature source Oral, height 5' 2"  (1.575 m), weight 200 lb (90.7 kg), SpO2 100 %. Body mass index is 36.58 kg/m. Physical Exam  Constitutional: She is oriented to person, place, and time. She appears well-developed and well-nourished.  Cardiovascular: Normal rate.  Pulmonary/Chest: Effort normal.  Musculoskeletal: Normal range of motion.  Neurological: She is oriented to person, place, and  time.  Skin: Skin is warm and dry.  Psychiatric: She has a normal mood and affect. Her behavior is normal.  Vitals reviewed.   RECENT LABS AND TESTS: BMET    Component Value Date/Time   NA 135 05/19/2018 1127   NA 141 01/24/2017 1228   K 4.3 05/19/2018 1127   K 4.6 01/24/2017 1228   CL 104 05/19/2018 1127   CO2 22 05/19/2018 1127   CO2 29 01/24/2017 1228   GLUCOSE 183 (H) 05/19/2018 1127   GLUCOSE 84 01/24/2017 1228   BUN 38 (H) 05/19/2018 1127   BUN 22.2 01/24/2017 1228   CREATININE 1.90 (H) 05/19/2018 1127    CREATININE 1.98 (H) 03/27/2018 1324   CREATININE 0.8 01/24/2017 1228   CALCIUM 9.6 05/19/2018 1127   CALCIUM 10.2 01/24/2017 1228   GFRNONAA 27 (L) 05/19/2018 1127   GFRNONAA 26 (L) 03/27/2018 1324   GFRNONAA 78 12/16/2014 1530   GFRAA 31 (L) 05/19/2018 1127   GFRAA 30 (L) 03/27/2018 1324   GFRAA >89 12/16/2014 1530   Lab Results  Component Value Date   HGBA1C 7.9 (H) 09/01/2017   HGBA1C 5.3 04/01/2017   HGBA1C 5.8 (H) 09/05/2015   HGBA1C 6.8 (H) 11/21/2014   HGBA1C 6.3 (H) 11/07/2014   No results found for: INSULIN CBC    Component Value Date/Time   WBC 4.4 05/19/2018 1127   RBC 3.78 (L) 05/19/2018 1127   HGB 10.4 (L) 05/19/2018 1127   HGB 10.2 (L) 03/27/2018 1324   HGB 12.4 07/29/2017 1444   HCT 35.1 (L) 05/19/2018 1127   HCT 38.0 07/29/2017 1444   PLT 350 05/19/2018 1127   PLT 268 03/27/2018 1324   PLT 260 07/29/2017 1444   MCV 92.9 05/19/2018 1127   MCV 90.9 07/29/2017 1444   MCH 27.5 05/19/2018 1127   MCHC 29.6 (L) 05/19/2018 1127   RDW 14.7 05/19/2018 1127   RDW 15.5 (H) 07/29/2017 1444   LYMPHSABS 0.7 05/19/2018 1127   LYMPHSABS 0.8 (L) 07/29/2017 1444   MONOABS 0.5 05/19/2018 1127   MONOABS 1.0 (H) 07/29/2017 1444   EOSABS 0.1 05/19/2018 1127   EOSABS 0.0 07/29/2017 1444   BASOSABS 0.1 05/19/2018 1127   BASOSABS 0.0 07/29/2017 1444   Iron/TIBC/Ferritin/ %Sat    Component Value Date/Time   IRON 7 (L) 08/31/2017 0451   IRON 12 (L) 11/29/2014 1251   TIBC 164 (L) 08/31/2017 0451   TIBC 331 11/29/2014 1251   FERRITIN 27 11/29/2014 1251   IRONPCTSAT 4 (L) 08/31/2017 0451   IRONPCTSAT 4 (L) 11/29/2014 1251   Lipid Panel     Component Value Date/Time   CHOL 109 08/28/2015 1617   TRIG 89 04/18/2017 0427   HDL 43.80 08/28/2015 1617   CHOLHDL 2 08/28/2015 1617   VLDL 19.8 08/28/2015 1617   LDLCALC 45 08/28/2015 1617   Hepatic Function Panel     Component Value Date/Time   PROT 8.5 (H) 05/19/2018 1127   PROT 6.9 01/24/2017 1228   ALBUMIN 2.8 (L)  05/19/2018 1127   ALBUMIN 3.4 (L) 01/24/2017 1228   AST 30 05/19/2018 1127   AST 14 (L) 03/27/2018 1324   AST 16 01/24/2017 1228   ALT 54 (H) 05/19/2018 1127   ALT 18 03/27/2018 1324   ALT 14 01/24/2017 1228   ALKPHOS 364 (H) 05/19/2018 1127   ALKPHOS 90 01/24/2017 1228   BILITOT 0.4 05/19/2018 1127   BILITOT 0.2 (L) 03/27/2018 1324   BILITOT 0.34 01/24/2017 1228  Component Value Date/Time   TSH 2.408 08/30/2017 0623   TSH 0.63 08/28/2015 1617    ASSESSMENT AND PLAN: Type 2 diabetes mellitus without complication, with long-term current use of insulin (HCC)  Class 2 severe obesity with serious comorbidity and body mass index (BMI) of 36.0 to 36.9 in adult, unspecified obesity type ([place])  PLAN:  Diabetes II Juletta has been given extensive diabetes education by myself today including ideal fasting and post-prandial blood glucose readings, individual ideal Hgb A1c goals and hypoglycemia prevention. We discussed the importance of good blood sugar control to decrease the likelihood of diabetic complications such as nephropathy, neuropathy, limb loss, blindness, coronary artery disease, and death. We discussed the importance of intensive lifestyle modification including diet, exercise and weight loss as the first line treatment for diabetes. Doloras agrees to decrease Lantus to 6 units BID and change to the low carbohydrate plan. Jalisia agrees to follow up with our clinic in 3 weeks.  I spent > than 50% of the 25 minute visit on counseling as documented in the note.  Obesity Kealohilani is currently in the action stage of change. As such, her goal is to continue with weight loss efforts She has agreed to change to follow a lower carbohydrate, vegetable and lean protein rich diet plan with moderate protein Anisia has been instructed to work up to a goal of 150 minutes of combined cardio and strengthening exercise per week for weight loss and overall health benefits. We discussed the following  Behavioral Modification Strategies today: increasing lean protein intake, decreasing simple carbohydrates, work on meal planning and easy cooking plans, dealing with family or coworker sabotage, holiday eating strategies, and travel eating strategies   Soniyah has agreed to follow up with our clinic in 3 weeks. She was informed of the importance of frequent follow up visits to maximize her success with intensive lifestyle modifications for her multiple health conditions.   OBESITY BEHAVIORAL INTERVENTION VISIT  Today's visit was # 5   Starting weight: 208 lbs Starting date: 04/05/18 Today's weight : 200 lbs  Today's date: 06/22/2018 Total lbs lost to date: 8    ASK: We discussed the diagnosis of obesity with Carmelia Bake Gadberry today and Jassica agreed to give [place] permission to discuss obesity behavioral modification therapy today.  ASSESS: Enedina has the diagnosis of obesity and her BMI today is 36.57 Jamee is in the action stage of change   ADVISE: Marcine was educated on the multiple health risks of obesity as well as the benefit of weight loss to improve her health. She was advised of the need for long term treatment and the importance of lifestyle modifications to improve her current health and to decrease her risk of future health problems.  AGREE: Multiple dietary modification options and treatment options were discussed and  Barby agreed to follow the recommendations documented in the above note.  ARRANGE: Copeland was educated on the importance of frequent visits to treat obesity as outlined per CMS and USPSTF guidelines and agreed to schedule her next follow up appointment today.  I, Trixie Dredge, am acting as transcriptionist for Dennard Nip, MD  I have reviewed the above documentation for accuracy and completeness, and I agree with the above. -Dennard Nip, MD

## 2018-06-27 DIAGNOSIS — Z933 Colostomy status: Secondary | ICD-10-CM | POA: Diagnosis not present

## 2018-06-27 DIAGNOSIS — L89319 Pressure ulcer of right buttock, unspecified stage: Secondary | ICD-10-CM | POA: Diagnosis not present

## 2018-06-28 DIAGNOSIS — E860 Dehydration: Secondary | ICD-10-CM | POA: Diagnosis not present

## 2018-06-28 DIAGNOSIS — D709 Neutropenia, unspecified: Secondary | ICD-10-CM | POA: Diagnosis not present

## 2018-06-28 DIAGNOSIS — T83592A Infection and inflammatory reaction due to indwelling ureteral stent, initial encounter: Secondary | ICD-10-CM | POA: Diagnosis not present

## 2018-06-28 DIAGNOSIS — N183 Chronic kidney disease, stage 3 (moderate): Secondary | ICD-10-CM | POA: Diagnosis not present

## 2018-06-28 DIAGNOSIS — E861 Hypovolemia: Secondary | ICD-10-CM | POA: Diagnosis not present

## 2018-06-28 DIAGNOSIS — T82594D Other mechanical complication of infusion catheter, subsequent encounter: Secondary | ICD-10-CM | POA: Diagnosis not present

## 2018-07-04 ENCOUNTER — Other Ambulatory Visit: Payer: Self-pay | Admitting: Family Medicine

## 2018-07-04 ENCOUNTER — Encounter (INDEPENDENT_AMBULATORY_CARE_PROVIDER_SITE_OTHER): Payer: Self-pay | Admitting: Family Medicine

## 2018-07-04 DIAGNOSIS — R52 Pain, unspecified: Secondary | ICD-10-CM

## 2018-07-04 MED ORDER — PREGABALIN 50 MG PO CAPS: ORAL_CAPSULE | ORAL | 0 refills | Status: DC

## 2018-07-04 NOTE — Telephone Encounter (Signed)
Last refill on 04/17/2018 #180 no refills Last OV 05/25/2018 UDS on 02/17/18/low risk

## 2018-07-05 DIAGNOSIS — N182 Chronic kidney disease, stage 2 (mild): Secondary | ICD-10-CM | POA: Diagnosis not present

## 2018-07-05 DIAGNOSIS — T82594D Other mechanical complication of infusion catheter, subsequent encounter: Secondary | ICD-10-CM | POA: Diagnosis not present

## 2018-07-10 DIAGNOSIS — N321 Vesicointestinal fistula: Secondary | ICD-10-CM | POA: Diagnosis not present

## 2018-07-10 DIAGNOSIS — L89319 Pressure ulcer of right buttock, unspecified stage: Secondary | ICD-10-CM | POA: Diagnosis not present

## 2018-07-10 DIAGNOSIS — K625 Hemorrhage of anus and rectum: Secondary | ICD-10-CM | POA: Diagnosis not present

## 2018-07-10 DIAGNOSIS — K624 Stenosis of anus and rectum: Secondary | ICD-10-CM | POA: Diagnosis not present

## 2018-07-10 DIAGNOSIS — Z933 Colostomy status: Secondary | ICD-10-CM | POA: Diagnosis not present

## 2018-07-12 DIAGNOSIS — N183 Chronic kidney disease, stage 3 (moderate): Secondary | ICD-10-CM | POA: Diagnosis not present

## 2018-07-12 DIAGNOSIS — T82594D Other mechanical complication of infusion catheter, subsequent encounter: Secondary | ICD-10-CM | POA: Diagnosis not present

## 2018-07-12 DIAGNOSIS — I129 Hypertensive chronic kidney disease with stage 1 through stage 4 chronic kidney disease, or unspecified chronic kidney disease: Secondary | ICD-10-CM | POA: Diagnosis not present

## 2018-07-12 DIAGNOSIS — E860 Dehydration: Secondary | ICD-10-CM | POA: Diagnosis not present

## 2018-07-12 DIAGNOSIS — E119 Type 2 diabetes mellitus without complications: Secondary | ICD-10-CM | POA: Diagnosis not present

## 2018-07-12 DIAGNOSIS — E782 Mixed hyperlipidemia: Secondary | ICD-10-CM | POA: Diagnosis not present

## 2018-07-12 DIAGNOSIS — T83592A Infection and inflammatory reaction due to indwelling ureteral stent, initial encounter: Secondary | ICD-10-CM | POA: Diagnosis not present

## 2018-07-13 ENCOUNTER — Ambulatory Visit (INDEPENDENT_AMBULATORY_CARE_PROVIDER_SITE_OTHER): Payer: BLUE CROSS/BLUE SHIELD | Admitting: Family Medicine

## 2018-07-13 ENCOUNTER — Encounter (INDEPENDENT_AMBULATORY_CARE_PROVIDER_SITE_OTHER): Payer: Self-pay | Admitting: Family Medicine

## 2018-07-13 VITALS — BP 102/69 | HR 87 | Ht 62.0 in | Wt 198.0 lb

## 2018-07-13 DIAGNOSIS — Z6836 Body mass index (BMI) 36.0-36.9, adult: Secondary | ICD-10-CM | POA: Diagnosis not present

## 2018-07-13 DIAGNOSIS — Z794 Long term (current) use of insulin: Secondary | ICD-10-CM | POA: Diagnosis not present

## 2018-07-13 DIAGNOSIS — E119 Type 2 diabetes mellitus without complications: Secondary | ICD-10-CM | POA: Diagnosis not present

## 2018-07-14 DIAGNOSIS — E039 Hypothyroidism, unspecified: Secondary | ICD-10-CM | POA: Diagnosis not present

## 2018-07-14 DIAGNOSIS — E119 Type 2 diabetes mellitus without complications: Secondary | ICD-10-CM | POA: Diagnosis not present

## 2018-07-14 DIAGNOSIS — E782 Mixed hyperlipidemia: Secondary | ICD-10-CM | POA: Diagnosis not present

## 2018-07-15 ENCOUNTER — Encounter: Payer: Self-pay | Admitting: Family Medicine

## 2018-07-17 ENCOUNTER — Other Ambulatory Visit: Payer: Self-pay | Admitting: Family Medicine

## 2018-07-17 DIAGNOSIS — C55 Malignant neoplasm of uterus, part unspecified: Secondary | ICD-10-CM

## 2018-07-17 MED ORDER — FENTANYL 50 MCG/HR TD PT72: 50.0000 ug | MEDICATED_PATCH | TRANSDERMAL | 0 refills | Status: DC

## 2018-07-17 NOTE — Progress Notes (Signed)
Office: [phone]  /  Fax: [phone]   HPI:   Chief Complaint: OBESITY [name] is here to discuss her progress with her obesity treatment plan. She is on the modified portion control better and make smarter food choices, such as increase vegetables and decrease simple carbohydrates and is following her eating plan approximately 98 % of the time. She states she is exercising 0 minutes 0 times per week. [name] continues to do well with weight loss even over Thanksgiving. She is doing low carb most of the time, but she doesn't like the plan.  Her weight is 198 lb (89.8 kg) today and has had a weight loss of 2 pounds over a period of 3 weeks since her last visit. She has lost 10 lbs since starting treatment with [place].  Diabetes II [name] has a diagnosis of diabetes type II. [name] states that her fasting blood sugars are decreased to the 70's with the lower carb plan, even with decreasing Lantus to 4 units. She denies any hypoglycemic episodes. Last A1c was 7.9 on 09/01/17. She has been working on intensive lifestyle modifications including diet, exercise, and weight loss to help control her blood glucose levels.  ALLERGIES: Allergies  Allergen Reactions  . Penicillins Swelling    Facial swelling/childhood allergy Has patient had a PCN reaction causing immediate rash, facial/tongue/throat swelling, SOB or lightheadedness with hypotension: Yes Has patient had a PCN reaction causing severe rash involving mucus membranes or skin necrosis: Yes Has patient had a PCN reaction that required hospitalization yes Has patient had a PCN reaction occurring within the last 10 years: No If all of the above answers are "NO", then may proceed with Cephalosporin use.   . Cefaclor Rash    Ceclor  . Erythromycin Other (See Comments)    Gastritis, abd cramps  . Tape Rash    blisters  . Trimethoprim Rash  . Ultram [Tramadol] Hives  . Cephalosporins Rash  . Fluconazole Rash  . Oxycodone Other (See Comments)     " I just feel weird"  . Pectin Rash    Pectin ring for stoma  . Septra [Sulfamethoxazole-Trimethoprim] Rash  . Sulfa Antibiotics Rash    MEDICATIONS: Current Outpatient Medications on File Prior to Visit  Medication Sig Dispense Refill  . acetaminophen (TYLENOL) 325 MG tablet Take 2 tablets (650 mg total) by mouth every 6 (six) hours as needed. 30 tablet 1  . anastrozole (ARIMIDEX) 1 MG tablet TAKE 1 TABLET DAILY (Patient taking differently: Take 1 mg by mouth daily. ) 90 tablet 3  . Biotin 5 MG TABS Take 5 mg by mouth every morning.     . cefdinir (OMNICEF) 300 MG capsule Take 1 capsule by mouth 2 (two) times daily.  0  . Cholecalciferol (VITAMIN D3) 10000 UNITS capsule Take 10,000 Units by mouth once a week. Sunday evening's    . clobetasol (OLUX) 0.05 % topical foam Apply topically 2 (two) times daily.    . collagenase (SANTYL) ointment Apply 1 application topically daily as needed (skin care).     . diphenhydrAMINE (BENADRYL) 25 mg capsule Take 1 capsule (25 mg total) by mouth every 8 (eight) hours as needed for itching, allergies or sleep. 30 capsule 0  . diphenoxylate-atropine (LOMOTIL) 2.5-0.025 MG tablet Take 1 tablet by mouth 4 (four) times daily as needed for diarrhea or loose stools. TO PREVENT LOOSE BOWEL MOVEMENTS (Patient taking differently: Take 1 tablet by mouth every other day. TO PREVENT LOOSE BOWEL MOVEMENTS) 30 tablet 0  .  fentaNYL (DURAGESIC) 50 MCG/HR Place 1 patch (50 mcg total) onto the skin every 3 (three) days. 10 patch 0  . ferrous sulfate 325 (65 FE) MG tablet Take 1 tablet (325 mg total) by mouth at bedtime. 30 tablet 3  . fosfomycin (MONUROL) 3 g PACK Take 3 g by mouth every other day. 9 g 0  . HYDROcodone-acetaminophen (NORCO/VICODIN) 5-325 MG tablet Take 1 tablet by mouth every 6 (six) hours as needed for moderate pain. 30 tablet 0  . insulin glargine (LANTUS) 100 UNIT/ML injection Inject 2 Units into the skin 2 (two) times daily.     [name] Kitchen JANUVIA 50 MG tablet  Take 50 mg by mouth daily.     [name] Kitchen levothyroxine (SYNTHROID, LEVOTHROID) 150 MCG tablet Take 1 tablet (150 mcg total) by mouth daily before breakfast. 30 tablet 1  . loratadine (CLARITIN) 10 MG tablet Take 10 mg by mouth every morning.     . nitrofurantoin, macrocrystal-monohydrate, (MACROBID) 100 MG capsule Take 100 mg by mouth at bedtime.    [name] Kitchen omega-3 acid ethyl esters (LOVAZA) 1 G capsule Take 1 g by mouth 2 (two) times daily.     [name] Glycol-Propyl Glycol (SYSTANE OP) Place 1 drop into both eyes daily as needed (dry eyes).     . pregabalin (LYRICA) 50 MG capsule TAKE 1 CAPSULE(50 MG) BY MOUTH TWICE DAILY 180 capsule 0  . Prenatal Vit-Fe Fumarate-FA (PRENATAL VITAMIN PO) Take 1 capsule by mouth daily. Takes prenatal because there are no dyes in it    . protein supplement shake (PREMIER PROTEIN) LIQD Take 325 mLs (11 oz total) by mouth 2 (two) times daily as needed (Pt to request). 60 Can 1  . rosuvastatin (CRESTOR) 10 MG tablet Take 10 mg by mouth every evening.     . Saccharomyces boulardii (FLORASTOR PO) Take 1 capsule by mouth daily.    . sodium bicarbonate 650 MG tablet Take 650 mg by mouth 2 (two) times daily.    . sodium chloride 0.9 % injection Inject 10 mLs into the vein daily.    . Tbo-Filgrastim (GRANIX) 480 MCG/0.8ML SOSY injection Inject 0.8 mLs (480 mcg total) into the skin daily. (Patient taking differently: Inject 480 mcg into the skin See admin instructions. Every 6 days) 30 Syringe 11   No current facility-administered medications on file prior to visit.     PAST MEDICAL HISTORY: Past Medical History:  Diagnosis Date  . Adrenal adenoma, left 02/08/2016   CT: stable benign  . Anemia in neoplastic disease   . Back pain   . Benign essential HTN   . 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 Cancer of corpus uteri, except isthmus [facility])  oncologist-- dr Denman [name] and dr Alvy Bimler    10-15-2004  dx endometroid endometrial and ovarian cancer s/p  chemotheapy and surgery(TAH w/ BSO) :  recurrent 11-19-2014 post pelvic surgery and radiation 01-29-2015 to 03-10-2015  . Chronic idiopathic neutropenia (HCC)    presumed related to chemotherapy March 2006--- followed by dr Alvy Bimler (treatment w/ G-CSF injections  . Chronic nausea   . Chronic pain    perineal/ anal  area from bladder pad irritates skin , right flank pain  . CKD stage G2/A3, GFR 60-89 and albumin creatinine ratio >300 mg/g    nephrologist-  dr Madelon Lips  .  Colovesical fistula   . Diabetic retinopathy, background ([place])   . Difficult intravenous access    small veins--- hx PICC lines  . DM type 2 (diabetes mellitus, type 2) ([place])    monitored by dr Legrand Como altheimer  . Dysuria   . Environmental and seasonal allergies   . Fatty liver 02/08/2016   CT  . Generalized muscle weakness   . GERD (gastroesophageal reflux disease)   . Hiatal hernia   . History of abdominal abscess 04/16/2017   post surgery 04-01-2017  --- resolved 10/ 2018  . History of gastric polyp    2014  duodenum  . History of ileus 04/16/2017   resolved w/ no surgical intervention  . History of radiation therapy    01-29-2015 to 03-10-2015  pelvis 50.4Gy  . Hypothyroidism    monitored by dr Legrand Como altheimer  . IBS (irritable bowel syndrome)   . Ileostomy in place [facility]) 04/01/2017   created at same time colostomy takedown.  . Joint pain   . Leg edema   . Lower urinary tract symptoms (LUTS)    urge urinary  incontinence  . Mixed dyslipidemia   . Multiple thyroid nodules    Managed by Dr. Harlow Asa  . Nephrostomy status ([place])   . Palpitations   . Pelvic abscess in female 04/16/2017  . PONV (postoperative nausea and vomiting)    "scopolamine patch works for me"  . Radiation-induced dermatitis    contact dermatitis , radiation completed,  rash only on ankles now.  . Seasonal allergies   . Ureteral stricture, right UROLOGIT-  DR [facility]   CHRONIC--  TREATMENT URETERAL STENT  . 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 Vitamin D deficiency   . Wears glasses     PAST SURGICAL HISTORY: Past Surgical History:  Procedure Laterality Date  . APPENDECTOMY    . biopsy thyroid nodules    . BREAST RECONSTRUCTION WITH PLACEMENT OF TISSUE EXPANDER AND FLEX HD (ACELLULAR HYDRATED DERMIS) Bilateral 12/11/2015   Procedure: BILATERAL BREAST RECONSTRUCTION WITH PLACEMENT OF TISSUE EXPANDERS;  Surgeon: Irene Limbo, MD;  Location: [place];  Service: Plastics;  Laterality: Bilateral;  . COLONOSCOPY WITH PROPOFOL N/A 08/21/2013   Procedure: COLONOSCOPY WITH PROPOFOL;  Surgeon: Cleotis Nipper, MD;  [facility];  Service: Endoscopy;  Laterality: N/A;  . COLOSTOMY TAKEDOWN N/A 12/04/2014   Procedure: LAPROSCOPIC LYSIS OF ADHESIONS, SPLENIC MOBILIZATION, RELOCATION OF COLOSTOMY, DEBRIDEMENT INITIAL COLOSTOMY SITE;  Surgeon: Michael [name], MD;  [facility];  Service: General;  Laterality: N/A;  . CYSTOGRAM N/A 06/01/2017   Procedure: CYSTOGRAM;  Surgeon: Alexis Frock, MD;  [facility];  Service: Urology;  Laterality: N/A;  . CYSTOSCOPY W/ RETROGRADES Right 11/21/2015   Procedure: CYSTOSCOPY WITH RETROGRADE PYELOGRAM;  Surgeon: Alexis Frock, MD;  [facility];  Service: Urology;  Laterality: Right;  . CYSTOSCOPY W/ URETERAL STENT PLACEMENT Right 11/21/2015   Procedure: CYSTOSCOPY WITH STENT REPLACEMENT;  Surgeon: Alexis Frock, MD;  [facility];  Service: Urology;  Laterality: Right;  . CYSTOSCOPY W/ URETERAL STENT PLACEMENT Right 03/10/2016   Procedure: CYSTOSCOPY WITH STENT REPLACEMENT;  Surgeon: Alexis Frock, MD;  Location: [facility];  Service: Urology;  Laterality: Right;  . CYSTOSCOPY W/ URETERAL STENT PLACEMENT Right 06/30/2016   Procedure: CYSTOSCOPY WITH RETROGRADE PYELOGRAM/URETERAL STENT  EXCHANGE;  Surgeon: Alexis Frock, MD;  Location: [facility];  Service: Urology;  Laterality: Right;  . CYSTOSCOPY W/ URETERAL STENT PLACEMENT N/A 06/01/2017   Procedure: CYSTOSCOPY WITH EXAM  UNDER ANESTHESIA;  Surgeon: Alexis Frock, MD;  [facility];  Service: Urology;  Laterality: N/A;  . CYSTOSCOPY W/ URETERAL STENT PLACEMENT Right 08/17/2017   Procedure: CYSTOSCOPY WITH RETROGRADE PYELOGRAM/URETERAL STENT REMOVAL;  Surgeon: Alexis Frock, MD;  Location[facility];  Service: Urology;  Laterality: Right;  . CYSTOSCOPY WITH RETROGRADE PYELOGRAM, URETEROSCOPY AND STENT PLACEMENT Right 03/20/2015   Procedure: CYSTOSCOPY WITH RETROGRADE PYELOGRAM, URETEROSCOPY WITH BALLOON DILATION AND STENT PLACEMENT ON RIGHT;  Surgeon: Alexis Frock, MD;  Location: [facility];  Service: Urology;  Laterality: Right;  . CYSTOSCOPY WITH RETROGRADE PYELOGRAM, URETEROSCOPY AND STENT PLACEMENT Right 05/02/2015   Procedure: CYSTOSCOPY WITH RIGHT RETROGRADE PYELOGRAM,  DIAGNOSTIC URETEROSCOPY AND STENT PULL ;  Surgeon: Alexis Frock, MD;  Location[facility];  Service: Urology;  Laterality: Right;  . CYSTOSCOPY WITH RETROGRADE PYELOGRAM, URETEROSCOPY AND STENT PLACEMENT Right 09/05/2015   Procedure: CYSTOSCOPY WITH RETROGRADE PYELOGRAM,  AND STENT PLACEMENT;  Surgeon: Alexis Frock, MD;  [facility];  Service: Urology;  Laterality: Right;  . CYSTOSCOPY WITH RETROGRADE PYELOGRAM, URETEROSCOPY AND STENT PLACEMENT Right 04/01/2017   Procedure: CYSTOSCOPY WITH RETROGRADE PYELOGRAM, URETEROSCOPY AND STENT PLACEMENT;  Surgeon: Alexis Frock, MD;  [facility];  Service: Urology;  Laterality: Right;  . CYSTOSCOPY WITH STENT PLACEMENT Right 10/27/2016   Procedure: CYSTOSCOPY WITH STENT CHANGE and right retrograde pyelogram;  Surgeon: Alexis Frock, MD;  Location: [facility];  Service: Urology;  Laterality: Right;  . EUS N/A 10/02/2014     Procedure: LOWER ENDOSCOPIC ULTRASOUND (EUS);  Surgeon: Arta Silence, MD;  Location: [facility];  Service: Endoscopy;  Laterality: N/A;  . EXCISION SOFT TISSUE MASS RIGHT FOREMAN  12-08-2006  . EYE SURGERY  as child   pytosis of eyelids repair  . INCISION AND DRAINAGE OF WOUND Bilateral 12/26/2015   Procedure: DEBRIDEMENT OF BILATERAL MASTECTOMY FLAPS;  Surgeon: Irene Limbo, MD;  Location: [place];  Service: Plastics;  Laterality: Bilateral;  . IR CV LINE INJECTION  05/31/2017  . IR FLUORO GUIDE CV LINE LEFT  05/31/2017  . IR FLUORO GUIDE CV LINE RIGHT  04/06/2017  . IR FLUORO GUIDE CV MIDLINE PICC RIGHT  05/30/2017  . IR NEPHROSTOGRAM LEFT INITIAL PLACEMENT  09/02/2017  . IR NEPHROSTOGRAM LEFT THRU EXISTING ACCESS  11/29/2017  . IR NEPHROSTOGRAM RIGHT INITIAL PLACEMENT  09/02/2017  . IR NEPHROSTOGRAM RIGHT THRU EXISTING ACCESS  09/13/2017  . IR NEPHROSTOGRAM RIGHT THRU EXISTING ACCESS  11/29/2017  . IR NEPHROSTOMY EXCHANGE LEFT  11/28/2017  . IR NEPHROSTOMY EXCHANGE LEFT  01/05/2018  . IR NEPHROSTOMY EXCHANGE LEFT  02/16/2018  . IR NEPHROSTOMY EXCHANGE LEFT  03/30/2018  . IR NEPHROSTOMY EXCHANGE LEFT  05/12/2018  . IR NEPHROSTOMY EXCHANGE LEFT  06/21/2018  . IR NEPHROSTOMY EXCHANGE RIGHT  10/02/2017  . IR NEPHROSTOMY EXCHANGE RIGHT  11/28/2017  . IR NEPHROSTOMY EXCHANGE RIGHT  01/05/2018  . IR NEPHROSTOMY EXCHANGE RIGHT  02/16/2018  . IR NEPHROSTOMY EXCHANGE RIGHT  03/30/2018  . IR NEPHROSTOMY EXCHANGE RIGHT  05/12/2018  . IR NEPHROSTOMY EXCHANGE RIGHT  06/21/2018  . IR NEPHROSTOMY PLACEMENT LEFT  10/02/2017  . IR RADIOLOGIST EVAL & MGMT  05/03/2017  . IR US GUIDE VASC ACCESS LEFT  05/31/2017  . IR US GUIDE VASC ACCESS RIGHT  04/06/2017  . IR US GUIDE VASC ACCESS RIGHT  05/30/2017  . LAPAROSCOPIC CHOLECYSTECTOMY  1990  . LIPOSUCTION WITH LIPOFILLING Bilateral 04/16/2016   Procedure: LIPOSUCTION WITH LIPOFILLING TO BILATERAL CHEST;  Surgeon: Irene Limbo, MD;  Location: [place];  Service: Plastics;  Laterality: Bilateral;  . MASTECTOMY W/ SENTINEL NODE BIOPSY Bilateral 12/11/2015   Procedure: RIGHT PROPHYLACTIC MASTECTOMY, LEFT TOTAL MASTECTOMY WITH LEFT AXILLARY SENTINEL LYMPH NODE BIOPSY;  Surgeon: Stark Klein, MD;  Location: [place];  Service: General;  Laterality: Bilateral;  . OSTOMY N/A 11/19/2014   Procedure: OSTOMY;  Surgeon: Michael [name], MD;  [facility];  Service: General;  Laterality: N/A;  . PROCTOSCOPY N/A 04/01/2017   Procedure: RIDGE PROCTOSCOPY;  Surgeon: Michael [name], MD;  [facility];  Service: General;  Laterality: N/A;  . REMOVAL OF BILATERAL TISSUE EXPANDERS WITH PLACEMENT OF BILATERAL BREAST IMPLANTS Bilateral 04/16/2016   Procedure: REMOVAL OF BILATERAL TISSUE EXPANDERS WITH PLACEMENT OF BILATERAL BREAST IMPLANTS;  Surgeon: Irene Limbo, MD;  Location: [place];  Service: Plastics;  Laterality: Bilateral;  . ROBOTIC ASSISTED LAP VAGINAL HYSTERECTOMY N/A 11/19/2014   Procedure: ROBOTIC LYSIS OF ADHESIONS, CONVERTED TO LAPAROTOMY RADICAL UPPER VAGINECTOMY,LOW ANTERIOR BOWEL RESECTION, COLOSTOMY, BILATERAL URETERAL STENT PLACEMENT AND CYSTONOMY CLOSURE;  Surgeon: Everitt Amber, MD;  [facility];  Service: Gynecology;  Laterality: N/A;  . TISSUE EXPANDER FILLING Bilateral 12/26/2015   Procedure: EXPANSION OF BILATERAL CHEST TISSUE EXPANDERS (60 mL- Right; 75 mL- Left);  Surgeon: Irene Limbo, MD;  Location: [place];  Service: Plastics;  Laterality: Bilateral;  . TONSILLECTOMY    . TOTAL ABDOMINAL HYSTERECTOMY  March 2006   [facility]   and Bilateral Salpingoophorectomy/  staging for Ovarian cancer/  an  . XI ROBOTIC ASSISTED LOWER ANTERIOR RESECTION N/A 04/01/2017   Procedure: XI ROBOTIC VS LAPAROSCOPIC COLOSTOMY TAKEDOWN WITH LYSIS OF ADHESIONS.;  Surgeon: Michael [name], MD;  [facility];  Service: General;  Laterality: N/A;  ERAS PATHWAY    SOCIAL HISTORY: Social History    Tobacco Use  . Smoking status: Never Smoker  . Smokeless tobacco: Never Used  Substance Use Topics  . Alcohol use: Not Currently  . Drug use: No    FAMILY HISTORY: Family History  Problem Relation Age of Onset  . Cancer Mother 33       stomach ca  . Hypertension Mother   . Cancer Father 65       prostate ca  . Diabetes Father   . Heart disease Father        CABG  . Hypertension Father   . Hyperlipidemia Father   . Obesity Father   . Breast cancer Maternal Aunt        dx in her 87s  . Lymphoma Paternal Aunt   . Brain cancer Paternal Grandfather   . Ovarian cancer Other   . Diabetes Sister   . Hypertension Brother y-10  . Heart disease Brother        CABG  . Diabetes Brother     ROS: Review of Systems  Constitutional: Positive for weight loss.  Endo/Heme/Allergies:       Negative for hypoglycemia.    PHYSICAL EXAM: Blood pressure 102/69, pulse 87, height 5' 2"  (1.575 m), weight 198 lb (89.8 kg), SpO2 100 %. Body mass index is 36.21 kg/m. Physical Exam Vitals signs reviewed.  Constitutional:      Appearance: Normal appearance. She is obese.  Cardiovascular:     Rate and Rhythm: Normal rate.  Pulmonary:     Effort: Pulmonary effort is normal.  Musculoskeletal: Normal range of motion.  Skin:    General: Skin is warm and dry.  Neurological:  Mental Status: She is alert and oriented to person, place, and time.  Psychiatric:        Mood and Affect: Mood normal.        Behavior: Behavior normal.     RECENT LABS AND TESTS: BMET    Component Value Date/Time   NA 135 05/19/2018 1127   NA 141 01/24/2017 1228   K 4.3 05/19/2018 1127   K 4.6 01/24/2017 1228   CL 104 05/19/2018 1127   CO2 22 05/19/2018 1127   CO2 29 01/24/2017 1228   GLUCOSE 183 (H) 05/19/2018 1127   GLUCOSE 84 01/24/2017 1228   BUN 38 (H) 05/19/2018 1127   BUN 22.2 01/24/2017 1228   CREATININE 1.90 (H) 05/19/2018 1127   CREATININE 1.98 (H) 03/27/2018 1324   CREATININE 0.8  01/24/2017 1228   CALCIUM 9.6 05/19/2018 1127   CALCIUM 10.2 01/24/2017 1228   GFRNONAA 27 (L) 05/19/2018 1127   GFRNONAA 26 (L) 03/27/2018 1324   GFRNONAA 78 12/16/2014 1530   GFRAA 31 (L) 05/19/2018 1127   GFRAA 30 (L) 03/27/2018 1324   GFRAA >89 12/16/2014 1530   Lab Results  Component Value Date   HGBA1C 7.9 (H) 09/01/2017   HGBA1C 5.3 04/01/2017   HGBA1C 5.8 (H) 09/05/2015   HGBA1C 6.8 (H) 11/21/2014   HGBA1C 6.3 (H) 11/07/2014   No results found for: INSULIN CBC    Component Value Date/Time   WBC 4.4 05/19/2018 1127   RBC 3.78 (L) 05/19/2018 1127   HGB 10.4 (L) 05/19/2018 1127   HGB 10.2 (L) 03/27/2018 1324   HGB 12.4 07/29/2017 1444   HCT 35.1 (L) 05/19/2018 1127   HCT 38.0 07/29/2017 1444   PLT 350 05/19/2018 1127   PLT 268 03/27/2018 1324   PLT 260 07/29/2017 1444   MCV 92.9 05/19/2018 1127   MCV 90.9 07/29/2017 1444   MCH 27.5 05/19/2018 1127   MCHC 29.6 (L) 05/19/2018 1127   RDW 14.7 05/19/2018 1127   RDW 15.5 (H) 07/29/2017 1444   LYMPHSABS 0.7 05/19/2018 1127   LYMPHSABS 0.8 (L) 07/29/2017 1444   MONOABS 0.5 05/19/2018 1127   MONOABS 1.0 (H) 07/29/2017 1444   EOSABS 0.1 05/19/2018 1127   EOSABS 0.0 07/29/2017 1444   BASOSABS 0.1 05/19/2018 1127   BASOSABS 0.0 07/29/2017 1444   Iron/TIBC/Ferritin/ %Sat    Component Value Date/Time   IRON 7 (L) 08/31/2017 0451   IRON 12 (L) 11/29/2014 1251   TIBC 164 (L) 08/31/2017 0451   TIBC 331 11/29/2014 1251   FERRITIN 27 11/29/2014 1251   IRONPCTSAT 4 (L) 08/31/2017 0451   IRONPCTSAT 4 (L) 11/29/2014 1251   Lipid Panel     Component Value Date/Time   CHOL 109 08/28/2015 1617   TRIG 89 04/18/2017 0427   HDL 43.80 08/28/2015 1617   CHOLHDL 2 08/28/2015 1617   VLDL 19.8 08/28/2015 1617   LDLCALC 45 08/28/2015 1617   Hepatic Function Panel     Component Value Date/Time   PROT 8.5 (H) 05/19/2018 1127   PROT 6.9 01/24/2017 1228   ALBUMIN 2.8 (L) 05/19/2018 1127   ALBUMIN 3.4 (L) 01/24/2017 1228    AST 30 05/19/2018 1127   AST 14 (L) 03/27/2018 1324   AST 16 01/24/2017 1228   ALT 54 (H) 05/19/2018 1127   ALT 18 03/27/2018 1324   ALT 14 01/24/2017 1228   ALKPHOS 364 (H) 05/19/2018 1127   ALKPHOS 90 01/24/2017 1228   BILITOT 0.4 05/19/2018 1127   BILITOT 0.2 (  L) 03/27/2018 1324   BILITOT 0.34 01/24/2017 1228      Component Value Date/Time   TSH 2.408 08/30/2017 0623   TSH 0.63 08/28/2015 1617   Results for NASTASIA, KAGE (MRN [id number]) as of 07/17/2018 10:21  Ref. Range 04/05/2018 11:45  Vitamin D, 25-Hydroxy Latest Ref Range: 30.0 - 100.0 ng/mL 40.8   ASSESSMENT AND PLAN: Type 2 diabetes mellitus without complication, with long-term current use of insulin (HCC)  Class 2 severe obesity with serious comorbidity and body mass index (BMI) of 36.0 to 36.9 in adult, unspecified obesity type ([place])  PLAN:  Diabetes II [name] has been given extensive diabetes education by myself today including ideal fasting and post-prandial blood glucose readings, individual ideal Hgb A1c goals, and hypoglycemia prevention. We discussed the importance of good blood sugar control to decrease the likelihood of diabetic complications such as nephropathy, neuropathy, limb loss, blindness, coronary artery disease, and death. We discussed the importance of intensive lifestyle modification including diet, exercise and weight loss as the first line treatment for diabetes. [name] agrees to decrease her Lantus to 2 units BID and continue Januvia as prescribed and will follow up at the agreed upon time in 3 to 4 weeks.  I spent > than 50% of the 25 minute visit on counseling as documented in the note.  Obesity [name] is currently in the action stage of change. As such, her goal is to continue with weight loss efforts. She has agreed to portion control better and make smarter food choices or the Category 2 plan. [name] has been instructed to work up to a goal of 150 minutes of combined cardio and strengthening  exercise per week for weight loss and overall health benefits. We discussed the following Behavioral Modification Strategies today: increasing lean protein intake, decreasing simple carbohydrates, holiday eating strategies, and celebration eating strategies.  [name] has agreed to follow up with our clinic in 3 to 4 weeks. She was informed of the importance of frequent follow up visits to maximize her success with intensive lifestyle modifications for her multiple health conditions.   OBESITY BEHAVIORAL INTERVENTION VISIT  Today's visit was # 6   Starting weight: 208 lbs Starting date: 04/05/18 Today's weight : Weight: 198 lb (89.8 kg)  Today's date: 07/13/2018 Total lbs lost to date: 10  ASK: We discussed the diagnosis of obesity with [name] today and [name] agreed to give [place] permission to discuss obesity behavioral modification therapy today.  ASSESS: Malyah has the diagnosis of obesity and her BMI today is 36.2. [name] is in the action stage of change.   ADVISE: [name] was educated on the multiple health risks of obesity as well as the benefit of weight loss to improve her health. She was advised of the need for long term treatment and the importance of lifestyle modifications to improve her current health and to decrease her risk of future health problems.  AGREE: Multiple dietary modification options and treatment options were discussed and [name] agreed to follow the recommendations documented in the above note.  ARRANGE: [name] was educated on the importance of frequent visits to treat obesity as outlined per CMS and USPSTF guidelines and agreed to schedule her next follow up appointment today.  I, Marcille Blanco, am acting as transcriptionist for Starlyn Skeans, MD  I have reviewed the above documentation for accuracy and completeness, and I agree with the above. -Dennard Nip, MD

## 2018-07-18 DIAGNOSIS — N183 Chronic kidney disease, stage 3 (moderate): Secondary | ICD-10-CM | POA: Diagnosis not present

## 2018-07-18 DIAGNOSIS — E782 Mixed hyperlipidemia: Secondary | ICD-10-CM | POA: Diagnosis not present

## 2018-07-18 DIAGNOSIS — I129 Hypertensive chronic kidney disease with stage 1 through stage 4 chronic kidney disease, or unspecified chronic kidney disease: Secondary | ICD-10-CM | POA: Diagnosis not present

## 2018-07-18 DIAGNOSIS — E119 Type 2 diabetes mellitus without complications: Secondary | ICD-10-CM | POA: Diagnosis not present

## 2018-07-19 DIAGNOSIS — T83592A Infection and inflammatory reaction due to indwelling ureteral stent, initial encounter: Secondary | ICD-10-CM | POA: Diagnosis not present

## 2018-07-19 DIAGNOSIS — T82594D Other mechanical complication of infusion catheter, subsequent encounter: Secondary | ICD-10-CM | POA: Diagnosis not present

## 2018-07-20 ENCOUNTER — Encounter: Payer: Self-pay | Admitting: Family Medicine

## 2018-07-21 ENCOUNTER — Other Ambulatory Visit: Payer: Self-pay | Admitting: Family Medicine

## 2018-07-21 DIAGNOSIS — C55 Malignant neoplasm of uterus, part unspecified: Secondary | ICD-10-CM

## 2018-07-21 MED ORDER — FENTANYL 50 MCG/HR TD PT72: 50.0000 ug | MEDICATED_PATCH | TRANSDERMAL | 0 refills | Status: DC

## 2018-07-21 NOTE — Telephone Encounter (Signed)
Can you resend to local pharmacy

## 2018-07-22 DIAGNOSIS — L89309 Pressure ulcer of unspecified buttock, unspecified stage: Secondary | ICD-10-CM | POA: Diagnosis not present

## 2018-07-24 ENCOUNTER — Ambulatory Visit (HOSPITAL_COMMUNITY)
Admission: RE | Admit: 2018-07-24 | Discharge: 2018-07-24 | Disposition: A | Discharge status: Home / Self Care | Payer: BLUE CROSS/BLUE SHIELD | Source: Ambulatory Visit | Attending: Physician Assistant | Admitting: Physician Assistant

## 2018-07-24 ENCOUNTER — Encounter (HOSPITAL_COMMUNITY): Payer: Self-pay

## 2018-07-24 ENCOUNTER — Other Ambulatory Visit: Payer: Self-pay | Admitting: Physician Assistant

## 2018-07-24 DIAGNOSIS — N179 Acute kidney failure, unspecified: Secondary | ICD-10-CM

## 2018-07-26 DIAGNOSIS — E860 Dehydration: Secondary | ICD-10-CM | POA: Diagnosis not present

## 2018-07-27 DIAGNOSIS — T83592A Infection and inflammatory reaction due to indwelling ureteral stent, initial encounter: Secondary | ICD-10-CM | POA: Diagnosis not present

## 2018-07-27 DIAGNOSIS — T82594D Other mechanical complication of infusion catheter, subsequent encounter: Secondary | ICD-10-CM | POA: Diagnosis not present

## 2018-07-27 DIAGNOSIS — L89319 Pressure ulcer of right buttock, unspecified stage: Secondary | ICD-10-CM | POA: Diagnosis not present

## 2018-07-27 DIAGNOSIS — Z933 Colostomy status: Secondary | ICD-10-CM | POA: Diagnosis not present

## 2018-07-28 ENCOUNTER — Encounter: Payer: Self-pay | Admitting: Gynecologic Oncology

## 2018-07-28 ENCOUNTER — Inpatient Hospital Stay: Payer: BLUE CROSS/BLUE SHIELD | Attending: Gynecologic Oncology | Admitting: Gynecologic Oncology

## 2018-07-28 VITALS — BP 130/74 | HR 68 | Temp 97.6°F | Resp 18 | Ht 62.0 in | Wt 204.0 lb

## 2018-07-28 DIAGNOSIS — C569 Malignant neoplasm of unspecified ovary: Secondary | ICD-10-CM | POA: Diagnosis not present

## 2018-07-28 DIAGNOSIS — Z923 Personal history of irradiation: Secondary | ICD-10-CM | POA: Diagnosis not present

## 2018-07-28 DIAGNOSIS — Z170 Estrogen receptor positive status [ER+]: Secondary | ICD-10-CM

## 2018-07-28 DIAGNOSIS — Z90710 Acquired absence of both cervix and uterus: Secondary | ICD-10-CM

## 2018-07-28 DIAGNOSIS — C541 Malignant neoplasm of endometrium: Secondary | ICD-10-CM | POA: Insufficient documentation

## 2018-07-28 DIAGNOSIS — C50912 Malignant neoplasm of unspecified site of left female breast: Secondary | ICD-10-CM | POA: Diagnosis not present

## 2018-07-28 DIAGNOSIS — Z90722 Acquired absence of ovaries, bilateral: Secondary | ICD-10-CM | POA: Diagnosis not present

## 2018-07-28 NOTE — Patient Instructions (Signed)
Please notify Dr Denman [name] at phone number [phone] if you notice vaginal bleeding, new pelvic or abdominal pains, bloating, feeling full easy, or a change in bladder or bowel function.   Please return to see Dr Denman [name] in June, 2020.

## 2018-07-28 NOTE — Progress Notes (Signed)
Recurrent Endometrial cancer FOLLOWUP VISIT  Assessment:    62 y.o. year old with history of recurrent endometrioid endometrial/ovarian cancer.   S/p exploratory laparotomy, posterior supralevator pelvic exenteration, end colostomy, bladder repair, ureteral stenting with complete resection and negative margins on 11/09/14. S/p adjuvant radiation completed 03/10/15 Disease free since this time.  Postoperative and post-radiation right hydroureter and distal ureteral obstruction with ureteral stent in situ, repair complicated by vesico-colonic fistula formation.  No recurrence/persistence of tumor on imaging and physical exam (complete clinical response).   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 No specific follow-up or surveillance recommended by genetics counselor.  Plan: 1) History of recurrent endometrial/ovarian cancer: s/p complete resection with negative margins and s/p adjuvant radiation. No chemotherapy due to chronic idiopathic neutropenia.   CT imaging post treatment shows stable thickening of upper vagina and right peri-urethral tissues which is most likely radiation changes and postop changes. Will re-evaluate CT abd/pelvis to ensure no occult recurrence of disease given plans for extensive reconstructive surgery later this year.  Will continue 6 monthly surveillance exams until August, 2021.  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  Continuing follow-up with Dr Tresa Moore for stent replacements and management. Is now consulting with [facility] urologists, plastic surgeons and colorectal surgeons for possible radical reconstruction procedure this year  if she reduces her BMI to <30kg/m2.   3) neutropenia - Dr Alvy Bimler is managing this and we appreciate. Patient is having bone and joint aches and wonders if this is from more frequent Granix injections.   4) breast cancer - stage IIA. ER/PR positive. Treated with surgery and arimidex.  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 Will have patient follow-up with Dr Cristina Gong after breast surgery for close colon cancer and upper GI cancer surveillance  6) Follow-up: I will see [name] for followup in June, 2020.  HPI:  [name] is a 61 y.o. 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  She has a history of endometrial and ovarian endometrioid carcinoma treated in 2006 by Dr. [name] at [facility] in [place]. Her surgery (TAH, BSO) was followed by adjuvant chemotherapy with carboplatin plus paclitaxel due to the ovarian involvement and the presence of a cul de sac lesion also positive for disease. She denies receiving adjuvant radiation therapy. She is unclear if she had metastatic endometrial cancer to the ovary or duel primaries. She had a complete response to therapy however developed leukopenia in July 2010. 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 She sees Dr. Alvy Bimler for this and is treated with G-CSF injections.  She began experiencing rectal pain approximately in January 2016. She also reports narrowing of caliber of the stool. She denies hematochezia. She does report approximately 3 months of vaginal spotting. She's had no specific follow-up for her gynecologic cancers in the past 4 years.  As part of workup of her rectal pain she underwent a CT scan of the abdomen and pelvis on 09/12/2014. This demonstrated a new right perirectal mass  abutting the vaginal cuff measuring 3.8 x 4.9 cm.  There is a limited fat plane between the mass and the rectum posteriorly. Rectal invasion could not be excluded. There were no other masses identified in the abdomen and pelvis or lymphadenopathy. There is no other evidence of metastatic disease or recurrent disease. There was no hydronephrosis. A CA-125 drawn on 09/12/2014 was normal at 10.  PET was negative for extrapelvic disease. MRI defined the lesion as a 3.5x5cm lesion to the right of the rectum at the vaginal cuff.  Colonoscopy was performed on 10/02/14 with transrectal [place] and biopsy and this revealed endometrioid adenocarcinoma. Of note, the lesion was not seen within the lumen of the rectum.   She then underwent a posterior supralevator exenteration with colostomy and bladder repair and stent placement on 2/45/80 without complications with Dr Denman [name] and Dr Johney Maine.  Her postoperative course was uncomplicate with the exception of development of postop anemia.  Her final pathology revealed endometrioid adenocarcinoma invading the vagina and rectum with negative margins on the specimen. The margin had been close (clinically) to the right pelvic sidewall which was marked with surgical clips.  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 It was treated with IV antibiotics and then on POD 15 she was taken to the OR for laparoscopic revision of the stoma with Dr Michael [name]. Postoperatively she had wound vac and packing for her stomal wound.  A retrograde cystogram on week 4 postop confirmed an intact bladder and the foley was removed.  She initially had some voiding issues with decreased sensation to void. We tested a post void residual in May, 2016 and this revealed adequate voiding.  On 12/30/14 she was admitted to [facility] with sepsis associated with Enterobacter Cloacae UTI. This was treated with IV antibiotics and then a  prolonged course of oral cipro. Imaging performed at the time of admission (a CT of the abdo/pelvis) revealed: Bilateral double-J internal ureteral stents in adequate position with persistent mild bilateral hydronephrosis   She complete radiation therapy from 01/29/15 to 03/10/15 with 50Gy of external beam radiation and IMRT. She tolerated therapy well with minor skin irritation.  She had her ureteral stents removed in June, 2016, however, then developed right ureteral obstruction, hydroureter and pain. She went to the OR with Urologist Dr Tresa Moore on 03/20/15 for ureteral dilation and right stent placement (the left was draining well). No tumor was seen on cysto.   CT abdo/pelvis on 05/14/15 showed: no new lesions, stable thickening in right pelvis/distal right ureter consistent with radiation effect. The right ureteral stent was removed.  The patient's CT in January 2017 showed progression of her right ureteral obstruction after stent removal. Dr Tresa Moore took her to the OR on 09/05/15 for a cystoscopy, retrograde pyelogram and right ureteral stent placement.   The CT on 08/18/15 showed decreased attenuation of the right renal parenchyma, right hydronephrosis that was severe no excretion of contrast on that for graphic phase imaging. The perivascular soft tissue thickening in the pelvis and presacral regions grossly stable consistent with radiation changes.  The patient was diagnosed with ER/PR positive left breast cancer in April 2017. Surgery is scheduled for May. She underwent bilateral mastectomy with expander reconstruction. Postoperatively she was treated with Arimidex.   Repeat CT imaging on 02/08/16 revealed : Stable post treatment changes in presacral region. No evidence of recurrent or metastatic carcinoma within the abdomen or pelvis. Interval placement of right ureteral stent in appropriate position. Moderate right hydroureteronephrosis  remains stable. Stable benign left adrenal adenoma and hepatic  steatosis.  On 06/30/16 she had replacement of her right ureteral stent with Dr Tresa Moore.  On 10/07/16 CT surveillance was performed which showed: Stable exam.  No new or progressive findings. Stable appearance abnormal presacral soft tissue compatible with post treatment change. Internal right ureteral stent with persistent right hydroureteronephrosis. Differentially decreased perfusion of the right kidney suggests a component of underlying obstructive uropathy. Stable 13 mm left adrenal nodule, previously characterized as adenoma. A parastomal hernia was noted.  CT 01/27/17 showed stable findings with no evidence of recurrence. Persistent right hydroureter was again noted.  Her colostomy bothered her greatly and she hoped to be free of stomal appliances. She was frustrated with repetitive stent replacements and desired definitive reconstruction/reimplantation of the right ureter.    Given that a 2 year disease free interval had elapsed, in 04/01/17 Dr Tresa Moore and Dr Johney Maine returned to the operating room with Ms Brinson for a right ureteral resection, and bladder hitch with reimplantation and reversal of hartman's with temporary ileostomy complicated by postop development of urinoma which required a peritoneal drain and right ureteral stent.  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 She was offered permanent diversion with ileal conduit and permanent end colostomy. This was not considered a reasonable option at that time by [facility]. She became quite depressed by the slow recovery and sequelae of her last surgery. She sought second opinion with Gynecologic Oncology at [facility]. They offered referral to urology and colorectal specialists at [facility]. They recommended no additional cancer treatment as she was disease free. She is being worked up by them for possible laparotomy, revision of  right uretero-neocystotomy, placement of VRAM flap, and possible bowel resection and revision of ileostomy. Prior to embarking on this surgery they requested she lose weight to a BMI of <30kg/m2.  Interval Hx:  She reports persistent high output ileostomy requiring IVF at home and electrolyte replacement.   Review of systems: Constitutional:  She has no weight gain or weight loss. She has a low grade fever no chills. Eyes: No blurred vision Ears, Nose, Mouth, Throat: No dizziness, headaches or changes in hearing. No mouth sores. Cardiovascular: No chest pain, palpitations or edema. Respiratory:  No shortness of breath, wheezing  Gastrointestinal: She has normal bowel movements trhough stoma without diarrhea or constipation. She denies any nausea or vomiting. She denies blood in her stool or heart burn. Genitourinary:  See HPI Musculoskeletal: Denies muscle weakness or joint pains.  Skin:  She has no skin changes, rashes or itching Neurological:  Denies dizziness or headaches. No neuropathy, no numbness or tingling. Psychiatric:  She denies depression or anxiety. Hematologic/Lymphatic:   No easy bruising or bleeding  Allergies  Allergen Reactions  . Penicillins Swelling    Facial swelling/childhood allergy Has patient had a PCN reaction causing immediate rash, facial/tongue/throat swelling, SOB or lightheadedness with hypotension: Yes Has patient had a PCN reaction causing severe rash involving mucus membranes or skin necrosis: Yes Has patient had a PCN reaction that required hospitalization yes Has patient had a PCN reaction occurring within the last 10 years: No If all of the above answers are "NO", then may proceed with Cephalosporin use.   . Cefaclor Rash    Ceclor  . Erythromycin Other (See Comments)    Gastritis, abd cramps  . Tape Rash    blisters  . Trimethoprim  Rash  . Ultram [Tramadol] Hives  . Cephalosporins Rash  . Fluconazole Rash  . Oxycodone Other (See Comments)     " I just feel weird"  . Pectin Rash    Pectin ring for stoma  . Septra [Sulfamethoxazole-Trimethoprim] Rash  . Sulfa Antibiotics Rash    Current Outpatient Medications on File Prior to Visit  Medication Sig Dispense Refill  . acetaminophen (TYLENOL) 325 MG tablet Take 2 tablets (650 mg total) by mouth every 6 (six) hours as needed. 30 tablet 1  . anastrozole (ARIMIDEX) 1 MG tablet TAKE 1 TABLET DAILY (Patient taking differently: Take 1 mg by mouth daily. ) 90 tablet 3  . Biotin 5 MG TABS Take 5 mg by mouth every morning.     . Cholecalciferol (VITAMIN D3) 10000 UNITS capsule Take 10,000 Units by mouth once a week. Sunday evening's    . clobetasol (OLUX) 0.05 % topical foam Apply topically 2 (two) times daily.    . diphenhydrAMINE (BENADRYL) 25 mg capsule Take 1 capsule (25 mg total) by mouth every 8 (eight) hours as needed for itching, allergies or sleep. 30 capsule 0  . diphenoxylate-atropine (LOMOTIL) 2.5-0.025 MG tablet Take 1 tablet by mouth 4 (four) times daily as needed for diarrhea or loose stools. TO PREVENT LOOSE BOWEL MOVEMENTS (Patient taking differently: Take 1 tablet by mouth every other day. TO PREVENT LOOSE BOWEL MOVEMENTS) 30 tablet 0  . fentaNYL (DURAGESIC) 50 MCG/HR Place 1 patch (50 mcg total) onto the skin every 3 (three) days. 10 patch 0  . ferrous sulfate 325 (65 FE) MG tablet Take 1 tablet (325 mg total) by mouth at bedtime. 30 tablet 3  . HYDROcodone-acetaminophen (NORCO/VICODIN) 5-325 MG tablet Take 1 tablet by mouth every 6 (six) hours as needed for moderate pain. 30 tablet 0  . insulin glargine (LANTUS) 100 UNIT/ML injection Inject 2 Units into the skin 2 (two) times daily.     [name] Kitchen JANUVIA 50 MG tablet Take 50 mg by mouth daily.     [name] Kitchen levothyroxine (SYNTHROID, LEVOTHROID) 150 MCG tablet Take 1 tablet (150 mcg total) by mouth daily before breakfast. 30 tablet 1  . loratadine (CLARITIN) 10 MG tablet Take 10 mg by mouth every morning.     . nitrofurantoin,  macrocrystal-monohydrate, (MACROBID) 100 MG capsule Take 100 mg by mouth at bedtime.    [name] Kitchen omega-3 acid ethyl esters (LOVAZA) 1 G capsule Take 1 g by mouth 2 (two) times daily.     Vladimir Faster Glycol-Propyl Glycol (SYSTANE OP) Place 1 drop into both eyes daily as needed (dry eyes).     . pregabalin (LYRICA) 50 MG capsule TAKE 1 CAPSULE(50 MG) BY MOUTH TWICE DAILY 180 capsule 0  . Prenatal Vit-Fe Fumarate-FA (PRENATAL VITAMIN PO) Take 1 capsule by mouth daily. Takes prenatal because there are no dyes in it    . rosuvastatin (CRESTOR) 10 MG tablet Take 10 mg by mouth every evening.     . Saccharomyces boulardii (FLORASTOR PO) Take 1 capsule by mouth daily.    . sodium bicarbonate 650 MG tablet Take 650 mg by mouth 2 (two) times daily.    . sodium chloride 0.9 % injection Inject 10 mLs into the vein daily.    . Tbo-Filgrastim (GRANIX) 480 MCG/0.8ML SOSY injection Inject 0.8 mLs (480 mcg total) into the skin daily. (Patient taking differently: Inject 480 mcg into the skin See admin instructions. Every 6 days) 30 Syringe 11   No current facility-administered medications on file prior to  visit.     Past Medical History:  Diagnosis Date  . Adrenal adenoma, left 02/08/2016   CT: stable benign  . Anemia in neoplastic disease   . Back pain   . Benign essential HTN   . Breast cancer, left 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 Cancer of corpus uteri, except isthmus [facility])  oncologist-- dr Denman [name] and dr Alvy Bimler    10-15-2004  dx endometroid endometrial and ovarian cancer s/p  chemotheapy and surgery(TAH w/ BSO) :  recurrent 11-19-2014 post pelvic surgery and radiation 01-29-2015 to 03-10-2015  . Chronic idiopathic neutropenia (HCC)    presumed related to chemotherapy March 2006--- followed by dr Alvy Bimler (treatment w/ G-CSF injections  .  Chronic nausea   . Chronic pain    perineal/ anal  area from bladder pad irritates skin , right flank pain  . CKD stage G2/A3, GFR 60-89 and albumin creatinine ratio >300 mg/g    nephrologist-  dr Madelon Lips  . Colovesical fistula   . Diabetic retinopathy, background ([place])   . Difficult intravenous access    small veins--- hx PICC lines  . DM type 2 (diabetes mellitus, type 2) ([place])    monitored by dr Legrand Como altheimer  . Dysuria   . Environmental and seasonal allergies   . Fatty liver 02/08/2016   CT  . Generalized muscle weakness   . GERD (gastroesophageal reflux disease)   . Hiatal hernia   . History of abdominal abscess 04/16/2017   post surgery 04-01-2017  --- resolved 10/ 2018  . History of gastric polyp    2014  duodenum  . History of ileus 04/16/2017   resolved w/ no surgical intervention  . History of radiation therapy    01-29-2015 to 03-10-2015  pelvis 50.4Gy  . Hypothyroidism    monitored by dr Legrand Como altheimer  . IBS (irritable bowel syndrome)   . Ileostomy in place [facility]) 04/01/2017   created at same time colostomy takedown.  . Joint pain   . Leg edema   . Lower urinary tract symptoms (LUTS)    urge urinary  incontinence  . Mixed dyslipidemia   . Multiple thyroid nodules    Managed by Dr. Harlow Asa  . Nephrostomy status ([place])   . Palpitations   . Pelvic abscess in female 04/16/2017  . PONV (postoperative nausea and vomiting)    "scopolamine patch works for me"  . Radiation-induced dermatitis    contact dermatitis , radiation completed, rash only on ankles now.  . Seasonal allergies   . Ureteral stricture, right UROLOGIT-  DR [facility]   CHRONIC--  TREATMENT URETERAL STENT  . 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 Vitamin D deficiency   . Wears glasses     Past Surgical History:  Procedure Laterality Date  . APPENDECTOMY    . biopsy thyroid nodules    . BREAST RECONSTRUCTION WITH PLACEMENT OF TISSUE EXPANDER AND FLEX HD (ACELLULAR HYDRATED  DERMIS) Bilateral 12/11/2015   Procedure: BILATERAL BREAST RECONSTRUCTION WITH PLACEMENT OF TISSUE EXPANDERS;  Surgeon: Irene Limbo, MD;  Location: [place];  Service: Plastics;  Laterality: Bilateral;  . COLONOSCOPY WITH PROPOFOL N/A 08/21/2013   Procedure: COLONOSCOPY WITH PROPOFOL;  Surgeon: Cleotis Nipper, MD;  Location: [facility];  Service: Endoscopy;  Laterality: N/A;  . COLOSTOMY TAKEDOWN N/A 12/04/2014   Procedure: LAPROSCOPIC LYSIS OF  ADHESIONS, SPLENIC MOBILIZATION, RELOCATION OF COLOSTOMY, DEBRIDEMENT INITIAL COLOSTOMY SITE;  Surgeon: Michael [name], MD;  Location: [facility];  Service: General;  Laterality: N/A;  . CYSTOGRAM N/A 06/01/2017   Procedure: CYSTOGRAM;  Surgeon: Alexis Frock, MD;  Location[facility];  Service: Urology;  Laterality: N/A;  . CYSTOSCOPY W/ RETROGRADES Right 11/21/2015   Procedure: CYSTOSCOPY WITH RETROGRADE PYELOGRAM;  Surgeon: Alexis Frock, MD;  [facility];  Service: Urology;  Laterality: Right;  . CYSTOSCOPY W/ URETERAL STENT PLACEMENT Right 11/21/2015   Procedure: CYSTOSCOPY WITH STENT REPLACEMENT;  Surgeon: Alexis Frock, MD;  [facility];  Service: Urology;  Laterality: Right;  . CYSTOSCOPY W/ URETERAL STENT PLACEMENT Right 03/10/2016   Procedure: CYSTOSCOPY WITH STENT REPLACEMENT;  Surgeon: Alexis Frock, MD;  Location: [facility];  Service: Urology;  Laterality: Right;  . CYSTOSCOPY W/ URETERAL STENT PLACEMENT Right 06/30/2016   Procedure: CYSTOSCOPY WITH RETROGRADE PYELOGRAM/URETERAL STENT EXCHANGE;  Surgeon: Alexis Frock, MD;  Location: [facility];  Service: Urology;  Laterality: Right;  . CYSTOSCOPY W/ URETERAL STENT PLACEMENT N/A 06/01/2017   Procedure: CYSTOSCOPY WITH EXAM UNDER ANESTHESIA;  Surgeon: Alexis Frock, MD;  [facility];  Service: Urology;  Laterality: N/A;  . CYSTOSCOPY W/ URETERAL STENT PLACEMENT Right 08/17/2017   Procedure: CYSTOSCOPY WITH RETROGRADE PYELOGRAM/URETERAL STENT REMOVAL;   Surgeon: Alexis Frock, MD;  Location: [facility];  Service: Urology;  Laterality: Right;  . CYSTOSCOPY WITH RETROGRADE PYELOGRAM, URETEROSCOPY AND STENT PLACEMENT Right 03/20/2015   Procedure: CYSTOSCOPY WITH RETROGRADE PYELOGRAM, URETEROSCOPY WITH BALLOON DILATION AND STENT PLACEMENT ON RIGHT;  Surgeon: Alexis Frock, MD;  Location: [facility];  Service: Urology;  Laterality: Right;  . CYSTOSCOPY WITH RETROGRADE PYELOGRAM, URETEROSCOPY AND STENT PLACEMENT Right 05/02/2015   Procedure: CYSTOSCOPY WITH RIGHT RETROGRADE PYELOGRAM,  DIAGNOSTIC URETEROSCOPY AND STENT PULL ;  Surgeon: Alexis Frock, MD;  Location: [facility];  Service: Urology;  Laterality: Right;  . CYSTOSCOPY WITH RETROGRADE PYELOGRAM, URETEROSCOPY AND STENT PLACEMENT Right 09/05/2015   Procedure: CYSTOSCOPY WITH RETROGRADE PYELOGRAM,  AND STENT PLACEMENT;  Surgeon: Alexis Frock, MD;  [facility];  Service: Urology;  Laterality: Right;  . CYSTOSCOPY WITH RETROGRADE PYELOGRAM, URETEROSCOPY AND STENT PLACEMENT Right 04/01/2017   Procedure: CYSTOSCOPY WITH RETROGRADE PYELOGRAM, URETEROSCOPY AND STENT PLACEMENT;  Surgeon: Alexis Frock, MD;  [facility];  Service: Urology;  Laterality: Right;  . CYSTOSCOPY WITH STENT PLACEMENT Right 10/27/2016   Procedure: CYSTOSCOPY WITH STENT CHANGE and right retrograde pyelogram;  Surgeon: Alexis Frock, MD;  Location: [facility];  Service: Urology;  Laterality: Right;  . EUS N/A 10/02/2014   Procedure: LOWER ENDOSCOPIC ULTRASOUND (EUS);  Surgeon: Arta Silence, MD;  Location: [facility];  Service: Endoscopy;  Laterality: N/A;  . EXCISION SOFT TISSUE MASS RIGHT FOREMAN  12-08-2006  . EYE SURGERY  as child   pytosis of eyelids repair  . INCISION AND DRAINAGE OF WOUND Bilateral 12/26/2015   Procedure: DEBRIDEMENT OF BILATERAL MASTECTOMY FLAPS;  Surgeon: Irene Limbo, MD;  Location: [place];  Service:  Plastics;  Laterality: Bilateral;  . IR CV LINE INJECTION  05/31/2017  . IR FLUORO GUIDE CV LINE LEFT  05/31/2017  . IR FLUORO GUIDE CV LINE RIGHT  04/06/2017  . IR FLUORO GUIDE CV MIDLINE PICC RIGHT  05/30/2017  . IR NEPHROSTOGRAM LEFT INITIAL PLACEMENT  09/02/2017  . IR NEPHROSTOGRAM LEFT THRU EXISTING ACCESS  11/29/2017  . IR NEPHROSTOGRAM RIGHT INITIAL PLACEMENT  09/02/2017  .  IR NEPHROSTOGRAM RIGHT THRU EXISTING ACCESS  09/13/2017  . IR NEPHROSTOGRAM RIGHT THRU EXISTING ACCESS  11/29/2017  . IR NEPHROSTOMY EXCHANGE LEFT  11/28/2017  . IR NEPHROSTOMY EXCHANGE LEFT  01/05/2018  . IR NEPHROSTOMY EXCHANGE LEFT  02/16/2018  . IR NEPHROSTOMY EXCHANGE LEFT  03/30/2018  . IR NEPHROSTOMY EXCHANGE LEFT  05/12/2018  . IR NEPHROSTOMY EXCHANGE LEFT  06/21/2018  . IR NEPHROSTOMY EXCHANGE RIGHT  10/02/2017  . IR NEPHROSTOMY EXCHANGE RIGHT  11/28/2017  . IR NEPHROSTOMY EXCHANGE RIGHT  01/05/2018  . IR NEPHROSTOMY EXCHANGE RIGHT  02/16/2018  . IR NEPHROSTOMY EXCHANGE RIGHT  03/30/2018  . IR NEPHROSTOMY EXCHANGE RIGHT  05/12/2018  . IR NEPHROSTOMY EXCHANGE RIGHT  06/21/2018  . IR NEPHROSTOMY PLACEMENT LEFT  10/02/2017  . IR RADIOLOGIST EVAL & MGMT  05/03/2017  . IR US GUIDE VASC ACCESS LEFT  05/31/2017  . IR US GUIDE VASC ACCESS RIGHT  04/06/2017  . IR US GUIDE VASC ACCESS RIGHT  05/30/2017  . LAPAROSCOPIC CHOLECYSTECTOMY  1990  . LIPOSUCTION WITH LIPOFILLING Bilateral 04/16/2016   Procedure: LIPOSUCTION WITH LIPOFILLING TO BILATERAL CHEST;  Surgeon: Irene Limbo, MD;  Location: [place];  Service: Plastics;  Laterality: Bilateral;  . MASTECTOMY W/ SENTINEL NODE BIOPSY Bilateral 12/11/2015   Procedure: RIGHT PROPHYLACTIC MASTECTOMY, LEFT TOTAL MASTECTOMY WITH LEFT AXILLARY SENTINEL LYMPH NODE BIOPSY;  Surgeon: Stark Klein, MD;  Location: [place];  Service: General;  Laterality: Bilateral;  . OSTOMY N/A 11/19/2014   Procedure: OSTOMY;  Surgeon: Michael [name], MD;  [facility];  Service: General;   Laterality: N/A;  . PROCTOSCOPY N/A 04/01/2017   Procedure: RIDGE PROCTOSCOPY;  Surgeon: Michael [name], MD;  [facility];  Service: General;  Laterality: N/A;  . REMOVAL OF BILATERAL TISSUE EXPANDERS WITH PLACEMENT OF BILATERAL BREAST IMPLANTS Bilateral 04/16/2016   Procedure: REMOVAL OF BILATERAL TISSUE EXPANDERS WITH PLACEMENT OF BILATERAL BREAST IMPLANTS;  Surgeon: Irene Limbo, MD;  Location: [place];  Service: Plastics;  Laterality: Bilateral;  . ROBOTIC ASSISTED LAP VAGINAL HYSTERECTOMY N/A 11/19/2014   Procedure: ROBOTIC LYSIS OF ADHESIONS, CONVERTED TO LAPAROTOMY RADICAL UPPER VAGINECTOMY,LOW ANTERIOR BOWEL RESECTION, COLOSTOMY, BILATERAL URETERAL STENT PLACEMENT AND CYSTONOMY CLOSURE;  Surgeon: Everitt Amber, MD;  Location: [facility];  Service: Gynecology;  Laterality: N/A;  . TISSUE EXPANDER FILLING Bilateral 12/26/2015   Procedure: EXPANSION OF BILATERAL CHEST TISSUE EXPANDERS (60 mL- Right; 75 mL- Left);  Surgeon: Irene Limbo, MD;  Location: [name];  Service: Plastics;  Laterality: Bilateral;  . TONSILLECTOMY    . TOTAL ABDOMINAL HYSTERECTOMY  March 2006   [facility]   and Bilateral Salpingoophorectomy/  staging for Ovarian cancer/  an  . XI ROBOTIC ASSISTED LOWER ANTERIOR RESECTION N/A 04/01/2017   Procedure: XI ROBOTIC VS LAPAROSCOPIC COLOSTOMY TAKEDOWN WITH LYSIS OF ADHESIONS.;  Surgeon: Michael [name], MD;  Location: [facility];  Service: General;  Laterality: N/A;  [facility]AS PATHWAY    Family History  Problem Relation Age of Onset  . Cancer Mother 27       stomach ca  . Hypertension Mother   . Cancer Father 42       prostate ca  . Diabetes Father   . Heart disease Father        CABG  . Hypertension Father   . Hyperlipidemia Father   . Obesity Father   . Breast cancer Maternal Aunt        dx in her 82s  . Lymphoma Paternal Aunt   .  Brain cancer Paternal Grandfather   . Ovarian cancer Other   . Diabetes Sister   . Hypertension Brother y-10   . Heart disease Brother        CABG  . Diabetes Brother     Social History   Socioeconomic History  . Marital status: Married    Spouse name: Lucelia Lacey  . Number of children: 1  . Years of education: Not on file  . Highest education level: Not on file  Occupational History  . 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 Financial resource strain: Not on file  . Food insecurity:    Worry: Not on file    Inability: Not on file  . Transportation needs:    Medical: Not on file    Non-medical: Not on file  Tobacco Use  . Smoking status: Never Smoker  . Smokeless tobacco: Never Used  Substance and Sexual Activity  . Alcohol use: Not Currently  . Drug use: No  . Sexual activity: Not on file  Lifestyle  . Physical activity:    Days per week: Not on file    Minutes per session: Not on file  . Stress: Not on file  Relationships  . Social connections:    Talks on phone: Not on file    Gets together: Not on file    Attends religious service: Not on file    Active member of club or organization: Not on file    Attends meetings of clubs or organizations: Not on file    Relationship status: Not on file  . Intimate partner violence:    Fear of current or ex partner: Not on file    Emotionally abused: Not on file    Physically abused: Not on file    Forced sexual activity: Not on file  Other Topics Concern  . Not on file  Social History Narrative   Exercise-- has not gotten back into it since cancer came back   CBC    Component Value Date/Time   WBC 4.4 05/19/2018 1127   RBC 3.78 (L) 05/19/2018 1127   HGB 10.4 (L) 05/19/2018 1127   HGB 10.2 (L) 03/27/2018 1324   HGB 12.4 07/29/2017 1444   HCT 35.1 (L) 05/19/2018 1127   HCT 38.0 07/29/2017 1444   PLT 350 05/19/2018 1127   PLT 268 03/27/2018 1324   PLT 260 07/29/2017 1444   MCV 92.9 05/19/2018 1127   MCV 90.9 07/29/2017 1444   MCH 27.5 05/19/2018 1127   MCHC 29.6 (L) 05/19/2018  1127   RDW 14.7 05/19/2018 1127   RDW 15.5 (H) 07/29/2017 1444   LYMPHSABS 0.7 05/19/2018 1127   LYMPHSABS 0.8 (L) 07/29/2017 1444   MONOABS 0.5 05/19/2018 1127   MONOABS 1.0 (H) 07/29/2017 1444   EOSABS 0.1 05/19/2018 1127   EOSABS 0.0 07/29/2017 1444   BASOSABS 0.1 05/19/2018 1127   BASOSABS 0.0 07/29/2017 1444     Physical Exam: Blood pressure 130/74, pulse 68, temperature 97.6 F (36.4 C), temperature source Oral, resp. rate 18, height _0  (1.575 m), weight 204 lb (92.5 kg), SpO2 100 %. General: Well dressed, well nourished in no apparent distress.   HEENT:  Normocephalic and atraumatic, no lesions.  Extraocular muscles intact. Sclerae anicteric. Pupils equal, round, reactive. No mouth sores or ulcers. Thyroid is normal size, not nodular, midline. Skin:  No lesions or rashes. Breasts:  deferred Lungs:  Clear to auscultation bilaterally.  No wheezes. Cardiovascular:  Regular rate and rhythm.  No murmurs or rubs. Abdomen:   Soft, somewhat tender, nondistended.  No palpable masses.  No hepatosplenomegaly.  No ascites. Normal bowel sounds.  No hernias.  Incision is healed. Ostomy appliance in situ.  Genitourinary: vaginal cuff intact without blood or fluid in the vault. Severe radiation fibrosis with indurated tissues. Normal mucosa. No palpable masses in pelvis. Rectal exam: stump in tact, no masses Extremities: No cyanosis, clubbing or edema.  No calf tenderness or erythema. No palpable cords. Psychiatric: Mood and affect are appropriate. Neurological: Awake, alert and oriented x 3. Sensation is intact, no neuropathy.  Musculoskeletal: No pain, normal strength and range of motion.  Thereasa Solo, MD  CC: Dr Tresa Moore, Dr Alvy Bimler, Dr Theressa Millard, Dr Madelon Lips

## 2018-08-02 DIAGNOSIS — T82594D Other mechanical complication of infusion catheter, subsequent encounter: Secondary | ICD-10-CM | POA: Diagnosis not present

## 2018-08-02 DIAGNOSIS — E860 Dehydration: Secondary | ICD-10-CM | POA: Diagnosis not present

## 2018-08-03 DIAGNOSIS — T82594D Other mechanical complication of infusion catheter, subsequent encounter: Secondary | ICD-10-CM | POA: Diagnosis not present

## 2018-08-03 DIAGNOSIS — T83592A Infection and inflammatory reaction due to indwelling ureteral stent, initial encounter: Secondary | ICD-10-CM | POA: Diagnosis not present

## 2018-08-04 ENCOUNTER — Other Ambulatory Visit (HOSPITAL_COMMUNITY): Payer: Self-pay | Admitting: Interventional Radiology

## 2018-08-04 ENCOUNTER — Other Ambulatory Visit: Payer: Self-pay | Admitting: Nephrology

## 2018-08-04 ENCOUNTER — Encounter (HOSPITAL_COMMUNITY): Payer: Self-pay | Admitting: Interventional Radiology

## 2018-08-04 ENCOUNTER — Ambulatory Visit (HOSPITAL_COMMUNITY)
Admission: RE | Admit: 2018-08-04 | Discharge: 2018-08-04 | Disposition: A | Discharge status: Home / Self Care | Payer: BLUE CROSS/BLUE SHIELD | Source: Ambulatory Visit | Attending: Interventional Radiology | Admitting: Interventional Radiology

## 2018-08-04 DIAGNOSIS — C569 Malignant neoplasm of unspecified ovary: Secondary | ICD-10-CM

## 2018-08-04 DIAGNOSIS — I8290 Acute embolism and thrombosis of unspecified vein: Secondary | ICD-10-CM

## 2018-08-04 DIAGNOSIS — Z436 Encounter for attention to other artificial openings of urinary tract: Secondary | ICD-10-CM | POA: Insufficient documentation

## 2018-08-04 DIAGNOSIS — C541 Malignant neoplasm of endometrium: Secondary | ICD-10-CM

## 2018-08-04 HISTORY — PX: IR NEPHROSTOMY EXCHANGE RIGHT: IMG6070

## 2018-08-04 HISTORY — PX: IR NEPHROSTOMY EXCHANGE LEFT: IMG6069

## 2018-08-04 MED ORDER — IOPAMIDOL (ISOVUE-300) INJECTION 61%
INTRAVENOUS | Status: AC
  Administered 2018-08-04: 5 mL
  Filled 2018-08-04: qty 50

## 2018-08-04 MED ORDER — LIDOCAINE HCL 1 % IJ SOLN
INTRAMUSCULAR | Status: AC
  Filled 2018-08-04: qty 20

## 2018-08-04 MED ORDER — IOPAMIDOL (ISOVUE-300) INJECTION 61%
50.0000 mL | Freq: Once | INTRAVENOUS | Status: AC | PRN
  Administered 2018-08-04: 5 mL

## 2018-08-04 NOTE — Procedures (Signed)
Pre Procedure Dx: Hydronephrosis Post Procedure Dx: Same  Successful bilateral PCN exchange.    EBL: None   No immediate complications.   Jay Kristofer Schaffert, MD Pager #: [phone]  

## 2018-08-07 ENCOUNTER — Telehealth: Payer: Self-pay

## 2018-08-07 NOTE — Telephone Encounter (Signed)
Advised wife that we need the juror letter.  She will let him know to just go tomorrow and see if he is actually going to be called.

## 2018-08-07 NOTE — Telephone Encounter (Signed)
Copied from [place] [id number]. Topic: General - Other >> Aug 07, 2018  9:01 AM Nils Flack wrote: Reason for CRM: husband called, he is being called for jury duty for tomorrow.  He is primary care taker of pt.  He would like a note to be excused out of jury duty so that he can take care of pt.  Pt has a test scheduled this week, and husband is the one who has to take her, he has to be with her to help her if something goes wrong with her ostomy bag.   Please call [phone]

## 2018-08-07 NOTE — Telephone Encounter (Signed)
We need his jury number --- ideally a copy of the letter.  They normally send the letter months in advance-- I don't think they will accept letter this late but we will try

## 2018-08-09 DIAGNOSIS — T82594D Other mechanical complication of infusion catheter, subsequent encounter: Secondary | ICD-10-CM | POA: Diagnosis not present

## 2018-08-09 DIAGNOSIS — L89309 Pressure ulcer of unspecified buttock, unspecified stage: Secondary | ICD-10-CM | POA: Diagnosis not present

## 2018-08-09 DIAGNOSIS — E860 Dehydration: Secondary | ICD-10-CM | POA: Diagnosis not present

## 2018-08-09 DIAGNOSIS — E785 Hyperlipidemia, unspecified: Secondary | ICD-10-CM | POA: Diagnosis not present

## 2018-08-10 ENCOUNTER — Inpatient Hospital Stay: Admission: RE | Admit: 2018-08-10 | Payer: Self-pay | Source: Ambulatory Visit

## 2018-08-10 DIAGNOSIS — T83592A Infection and inflammatory reaction due to indwelling ureteral stent, initial encounter: Secondary | ICD-10-CM | POA: Diagnosis not present

## 2018-08-10 DIAGNOSIS — T82594D Other mechanical complication of infusion catheter, subsequent encounter: Secondary | ICD-10-CM | POA: Diagnosis not present

## 2018-08-14 ENCOUNTER — Ambulatory Visit (INDEPENDENT_AMBULATORY_CARE_PROVIDER_SITE_OTHER): Payer: BLUE CROSS/BLUE SHIELD | Admitting: Family Medicine

## 2018-08-14 ENCOUNTER — Encounter (INDEPENDENT_AMBULATORY_CARE_PROVIDER_SITE_OTHER): Payer: Self-pay | Admitting: Family Medicine

## 2018-08-14 ENCOUNTER — Other Ambulatory Visit: Payer: Self-pay

## 2018-08-14 VITALS — BP 102/60 | HR 77 | Temp 97.7°F | Ht 62.0 in | Wt 193.0 lb

## 2018-08-14 DIAGNOSIS — Z6835 Body mass index (BMI) 35.0-35.9, adult: Secondary | ICD-10-CM

## 2018-08-14 DIAGNOSIS — E119 Type 2 diabetes mellitus without complications: Secondary | ICD-10-CM

## 2018-08-16 DIAGNOSIS — T83592A Infection and inflammatory reaction due to indwelling ureteral stent, initial encounter: Secondary | ICD-10-CM | POA: Diagnosis not present

## 2018-08-16 DIAGNOSIS — E860 Dehydration: Secondary | ICD-10-CM | POA: Diagnosis not present

## 2018-08-16 DIAGNOSIS — T82594D Other mechanical complication of infusion catheter, subsequent encounter: Secondary | ICD-10-CM | POA: Diagnosis not present

## 2018-08-16 DIAGNOSIS — N390 Urinary tract infection, site not specified: Secondary | ICD-10-CM | POA: Diagnosis not present

## 2018-08-16 IMAGING — CT CT ABD-PELV W/O CM
2 of 4 series · 16 of 46 positions shown, 18 images · non-contrast
Comparison: CT 06/11/2017

CLINICAL DATA: Recurrent ovarian carcinoma. Recurrent ovarian ca /
nausea/ vomiting x 2 weeks since stent removed Hx of breast ca w/
bilat mast no tx / ovarian ca w/ no tx / endometral ca w/ no tx
Appendix / gb/ breast / hyster Done without due to labs creat
gfr 16 pt not on dialysis

EXAM:
CT ABDOMEN AND PELVIS WITHOUT CONTRAST
TECHNIQUE: Multidetector CT imaging of the abdomen and pelvis was performed
following the standard protocol without IV contrast.

[Series 2: abd/pelvis w/(date) · axial · 0.84mm/px · z∈[+688,+1083]mm · 13 of 87 slices shown, 15 images]
[im 4/87  soft-tissue]
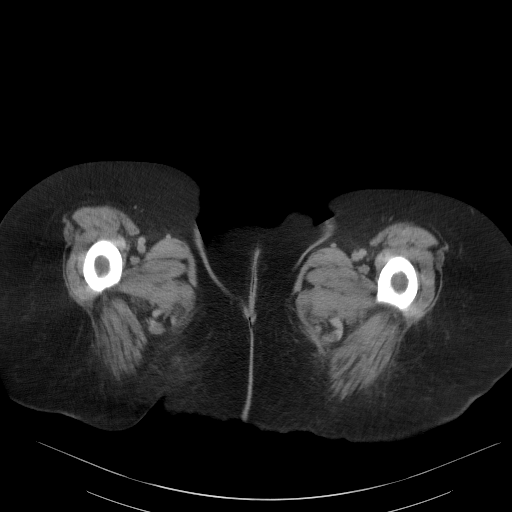
[im 4/87  bone]
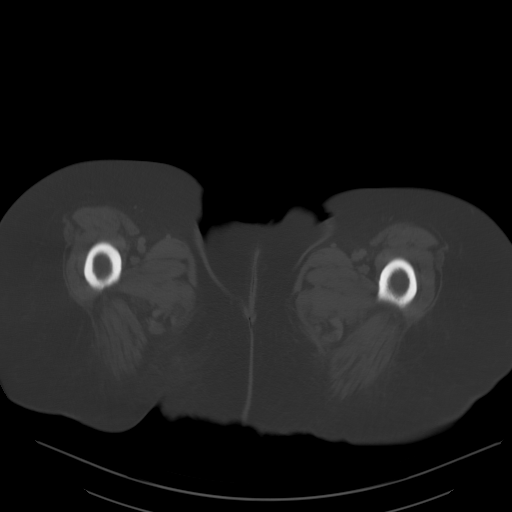
[im 11/87  soft-tissue]
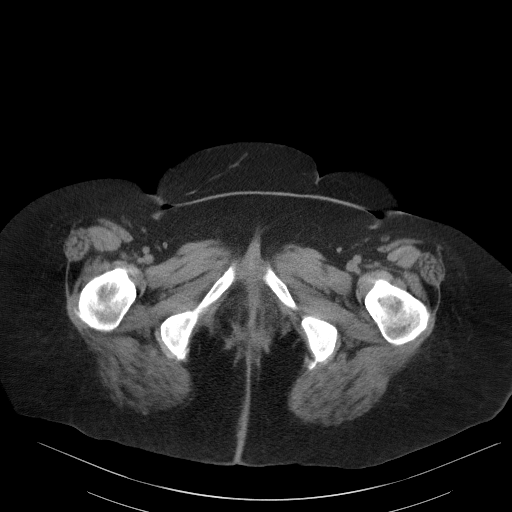
[im 18/87  soft-tissue]
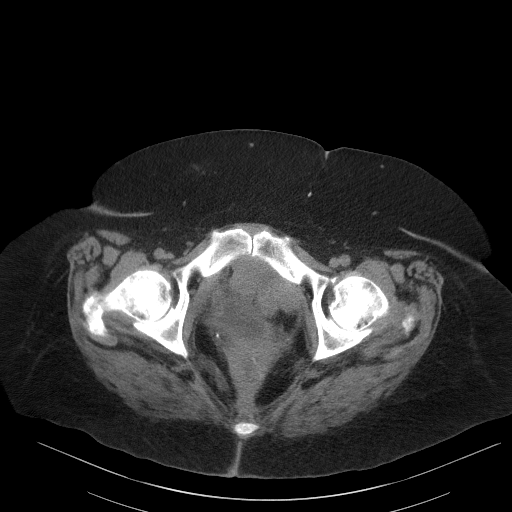
[im 25/87  soft-tissue]
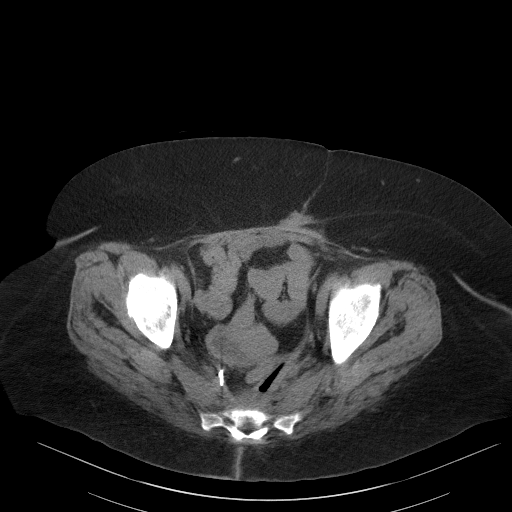
[im 31/87  soft-tissue]
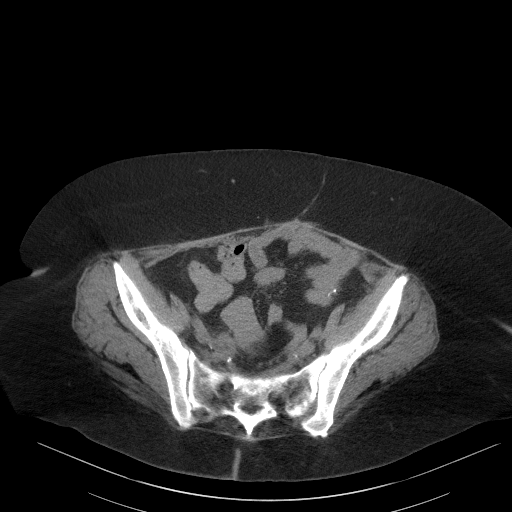
[im 38/87  soft-tissue]
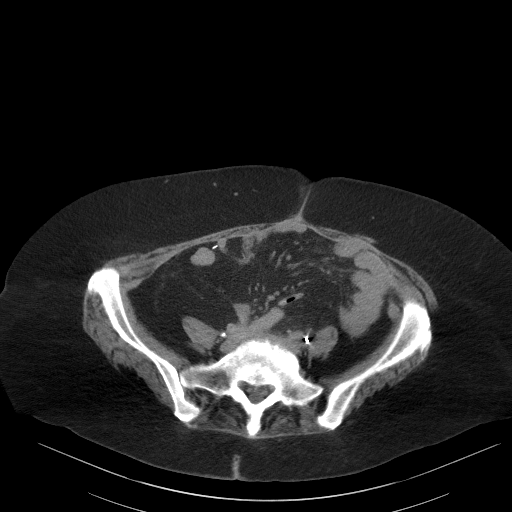
[im 45/87  soft-tissue]
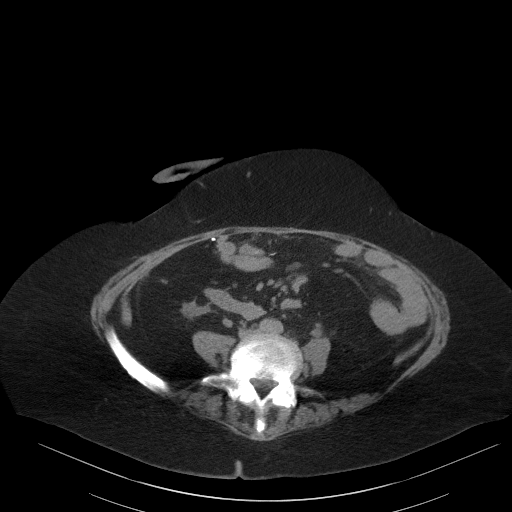
[im 49/87  soft-tissue]
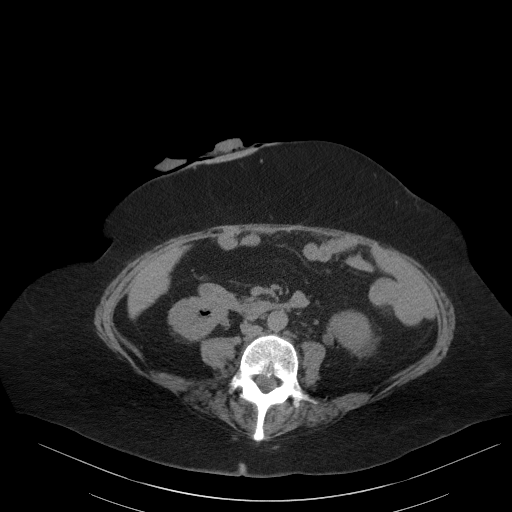
[im 56/87  soft-tissue]
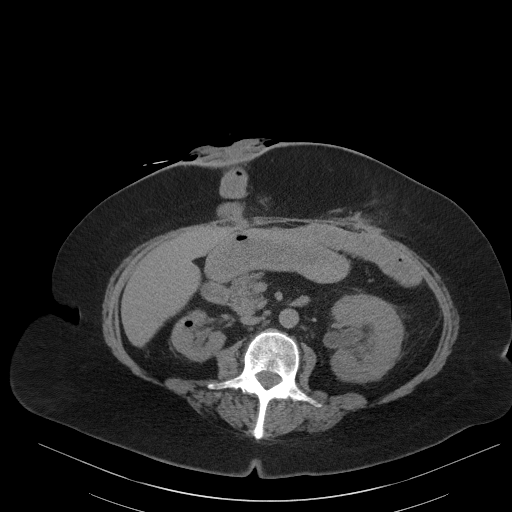
[im 56/87  bone]
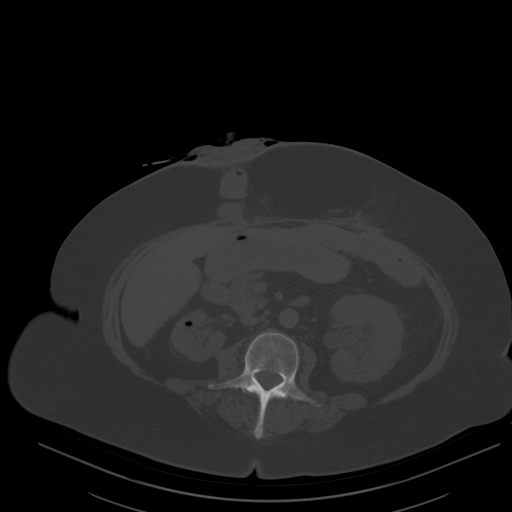
[im 62/87  soft-tissue]
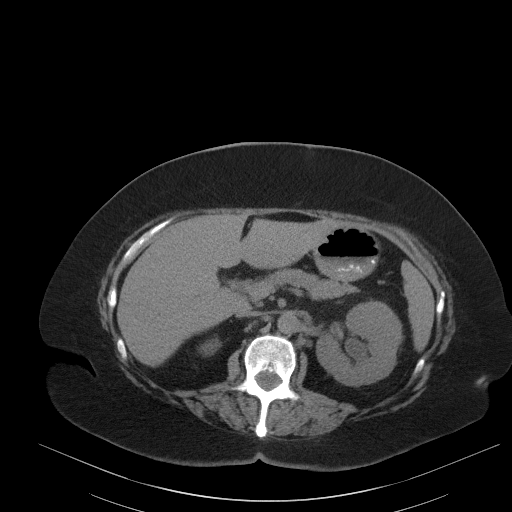
[im 69/87  soft-tissue]
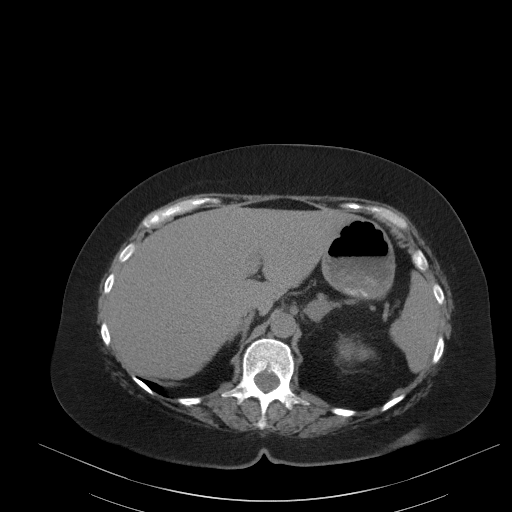
[im 76/87  soft-tissue]
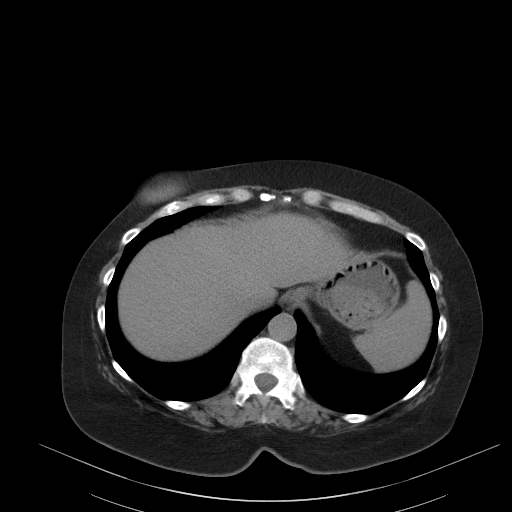
[im 83/87  soft-tissue]
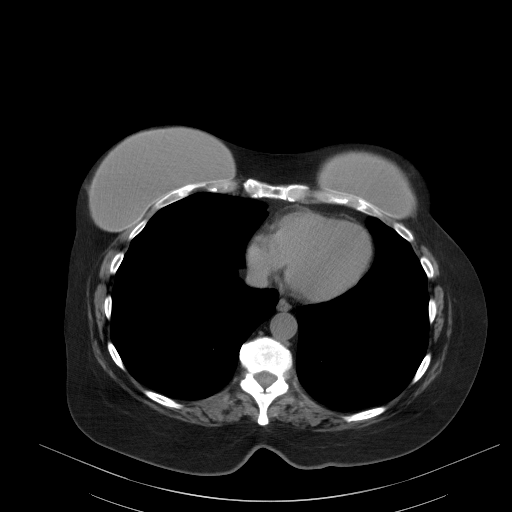

[Series 3: cor · coronal · 0.84mm/px · 3 of 102 slices shown]
[im 34/102  soft-tissue]
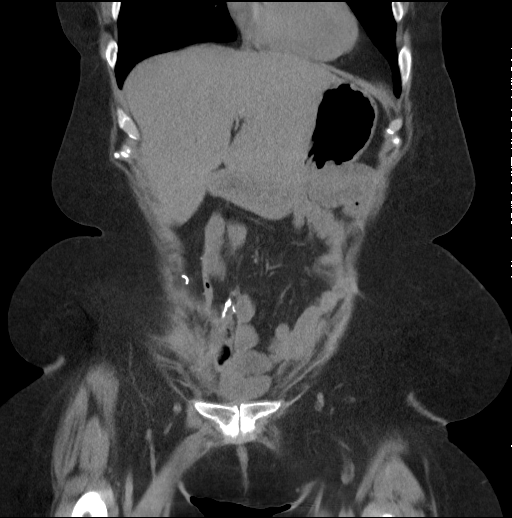
[im 45/102  soft-tissue]
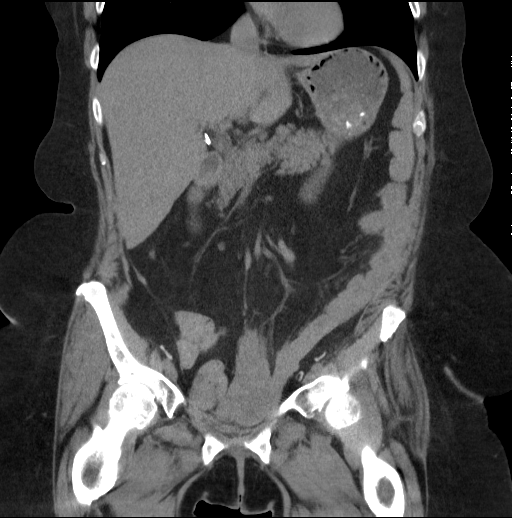
[im 57/102  soft-tissue]
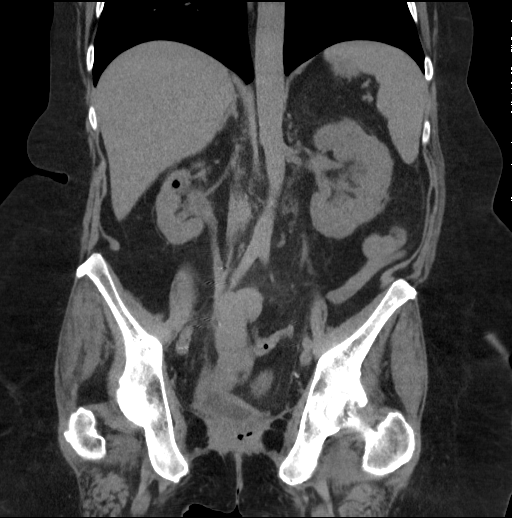

[16 of 46 positions shown; findings below may reference images not displayed]

FINDINGS: Lower chest: Lung bases are clear.

Hepatobiliary: No focal hepatic lesion. Postcholecystectomy. No
biliary dilatation.

Pancreas: Pancreas is normal. No ductal dilatation. No pancreatic
inflammation.

Spleen: Normal spleen

Adrenals/urinary tract: Nodule enlargement of the LEFT adrenal gland
16 mm is unchanged RIGHT adrenal gland normal. The RIGHT kidney is
smaller than the LEFT kidney. There is gas in the collecting system
of the RIGHT kidney (renal hilum and calices.) Neck hydroureter on
the RIGHT. Small gas in neobladder.

There is mild hydronephrosis and hydroureter of the LEFT. Several
calculi in lower pole of LEFT kidney. Small gas in bladder.

Stomach/Bowel: Stomach proximal small bowel normal. There is a RIGHT
abdominal ileostomy. Patient status post colectomy.

There is thin linear gas collection in the presacral space
paralleling the a peritoneal surface measuring 3.2 0.6 cm (image 60
series 7) no inflammation associated this fluid collection.
Presacral soft tissue thickening similar

Vascular/Lymphatic: Abdominal aorta is normal caliber. There is no
retroperitoneal or periportal lymphadenopathy. No pelvic
lymphadenopathy.

Reproductive: Post hysterectomy and oophorectomy

Other: No peritoneal nodularity. No omental nodularity. No
peritoneal studding. No mesenteric

Musculoskeletal: No aggressive osseous lesion.
IMPRESSION: 1. No evidence of ovarian carcinoma recurrence.
2. Gas within the collecting system on the RIGHT renal hilum an
urinary bladder bladder. Interval RIGHT ureteral stent removal.
3. Mild hydronephrosis on the LEFT and mild hydroureter. No
obstructing lesion identified.
4. New thin linear gas collection along the peritoneal reflection of
the deep LEFT pelvis.
5. Stable post presacral thickening.

## 2018-08-16 NOTE — Progress Notes (Signed)
 Office: [phone]  /  Fax: [phone]   HPI:   Chief Complaint: OBESITY [name] is here to discuss her progress with her obesity treatment plan. She is on the portion control better and make smarter food choices, such as increase vegetables and decrease simple carbohydrates or follow the Category 2 plan and is following her eating plan approximately 90 % of the time. She states she is exercising 0 minutes 0 times per week. [name] continues to do well with weight loss, but is deviating from her plan more and she is doing more of Atkins plan. she needs to get her BMI below 35 to start the process of repairing her pelvis/colon issues.  Her weight is 193 lb (87.5 kg) today and has had a weight loss of 5 pounds over a period of 4 to 5 weeks since her last visit. She has lost 15 lbs since starting treatment with us.  Diabetes II [name] has a diagnosis of diabetes type II. [name] states fasting BGs mostly range between 90 and 110, and 2 hour post prandials mostly range between 90 and 135. She has stopped her Lantus and she denies hypoglycemia.  Last A1c was 7.9 on 09/01/17. She is doing very well with diet and weight loss to help control her blood glucose levels.  ASSESSMENT AND PLAN:  Type 2 diabetes mellitus without complication, without long-term current use of insulin (HCC)  Class 2 severe obesity with serious comorbidity and body mass index (BMI) of 35.0 to 35.9 in adult, unspecified obesity type (HCC)  PLAN:  Diabetes II [name] has been given extensive diabetes education by myself today including ideal fasting and post-prandial blood glucose readings, individual ideal Hgb A1c goals and hypoglycemia prevention. We discussed the importance of good blood sugar control to decrease the likelihood of diabetic complications such as nephropathy, neuropathy, limb loss, blindness, coronary artery disease, and death. We discussed the importance of intensive lifestyle modification including diet, exercise  and weight loss as the first line treatment for diabetes. [name] agrees to discontinue Lantus and continue Januvia. [name] agrees to follow up with our clinic in 4 weeks.  I spent > than 50% of the 25 minute visit on counseling as documented in the note.  Obesity [name] is currently in the action stage of change. As such, her goal is to continue with weight loss efforts She has agreed to keep a food journal with 1200 calories and 75+ grams of protein daily [name] has been instructed to work up to a goal of 150 minutes of combined cardio and strengthening exercise per week for weight loss and overall health benefits. We discussed the following Behavioral Modification Strategies today: increasing lean protein intake, decreasing simple carbohydrates  and work on meal planning and easy cooking plans   [name] has agreed to follow up with our clinic in 4 weeks. She was informed of the importance of frequent follow up visits to maximize her success with intensive lifestyle modifications for her multiple health conditions.  ALLERGIES: Allergies  Allergen Reactions  . Penicillins Swelling    Facial swelling/childhood allergy Has patient had a PCN reaction causing immediate rash, facial/tongue/throat swelling, SOB or lightheadedness with hypotension: Yes Has patient had a PCN reaction causing severe rash involving mucus membranes or skin necrosis: Yes Has patient had a PCN reaction that required hospitalization yes Has patient had a PCN reaction occurring within the last 10 years: No If all of the above answers are "NO", then may proceed with Cephalosporin use.   .   Cefaclor Rash    Ceclor  . Erythromycin Other (See Comments)    Gastritis, abd cramps  . Tape Rash    blisters  . Trimethoprim Rash  . Ultram [Tramadol] Hives  . Cephalosporins Rash  . Fluconazole Rash  . Oxycodone Other (See Comments)    " I just feel weird"  . Pectin Rash    Pectin ring for stoma  . Septra  [Sulfamethoxazole-Trimethoprim] Rash  . Sulfa Antibiotics Rash    MEDICATIONS: Current Outpatient Medications on File Prior to Visit  Medication Sig Dispense Refill  . acetaminophen (TYLENOL) 325 MG tablet Take 2 tablets (650 mg total) by mouth every 6 (six) hours as needed. 30 tablet 1  . anastrozole (ARIMIDEX) 1 MG tablet TAKE 1 TABLET DAILY (Patient taking differently: Take 1 mg by mouth daily. ) 90 tablet 3  . Biotin 5 MG TABS Take 5 mg by mouth every morning.     . Cholecalciferol (VITAMIN D3) 10000 UNITS capsule Take 10,000 Units by mouth once a week. Sunday evening's    . clobetasol (OLUX) 0.05 % topical foam Apply topically 2 (two) times daily.    . diphenhydrAMINE (BENADRYL) 25 mg capsule Take 1 capsule (25 mg total) by mouth every 8 (eight) hours as needed for itching, allergies or sleep. 30 capsule 0  . diphenoxylate-atropine (LOMOTIL) 2.5-0.025 MG tablet Take 1 tablet by mouth 4 (four) times daily as needed for diarrhea or loose stools. TO PREVENT LOOSE BOWEL MOVEMENTS (Patient taking differently: Take 1 tablet by mouth every other day. TO PREVENT LOOSE BOWEL MOVEMENTS) 30 tablet 0  . fentaNYL (DURAGESIC) 50 MCG/HR Place 1 patch (50 mcg total) onto the skin every 3 (three) days. 10 patch 0  . ferrous sulfate 325 (65 FE) MG tablet Take 1 tablet (325 mg total) by mouth at bedtime. 30 tablet 3  . HYDROcodone-acetaminophen (NORCO/VICODIN) 5-325 MG tablet Take 1 tablet by mouth every 6 (six) hours as needed for moderate pain. 30 tablet 0  . JANUVIA 50 MG tablet Take 50 mg by mouth daily.     [name] Kitchen levothyroxine (SYNTHROID, LEVOTHROID) 150 MCG tablet Take 1 tablet (150 mcg total) by mouth daily before breakfast. 30 tablet 1  . loratadine (CLARITIN) 10 MG tablet Take 10 mg by mouth every morning.     . nitrofurantoin, macrocrystal-monohydrate, (MACROBID) 100 MG capsule Take 100 mg by mouth at bedtime.    [name] Kitchen omega-3 acid ethyl esters (LOVAZA) 1 G capsule Take 1 g by mouth 2 (two) times daily.       [name] Glycol-Propyl Glycol (SYSTANE OP) Place 1 drop into both eyes daily as needed (dry eyes).     . pregabalin (LYRICA) 50 MG capsule TAKE 1 CAPSULE(50 MG) BY MOUTH TWICE DAILY 180 capsule 0  . Prenatal Vit-Fe Fumarate-FA (PRENATAL VITAMIN PO) Take 1 capsule by mouth daily. Takes prenatal because there are no dyes in it    . rosuvastatin (CRESTOR) 10 MG tablet Take 10 mg by mouth every evening.     . Saccharomyces boulardii (FLORASTOR PO) Take 1 capsule by mouth daily.    . sodium bicarbonate 650 MG tablet Take 650 mg by mouth 2 (two) times daily.    . sodium chloride 0.9 % injection Inject 10 mLs into the vein daily.    . Tbo-Filgrastim (GRANIX) 480 MCG/0.8ML SOSY injection Inject 0.8 mLs (480 mcg total) into the skin daily. (Patient taking differently: Inject 480 mcg into the skin See admin instructions. Every 6 days) 30 Syringe 11  No current facility-administered medications on file prior to visit.     PAST MEDICAL HISTORY: Past Medical History:  Diagnosis Date  . Adrenal adenoma, left 02/08/2016   CT: stable benign  . Anemia in neoplastic disease   . Back pain   . Benign essential HTN   . 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 Cancer of corpus uteri, except isthmus (HCC)  oncologist-- dr rossi and dr gorsuch    10-15-2004  dx endometroid endometrial and ovarian cancer s/p  chemotheapy and surgery(TAH w/ BSO) :  recurrent 11-19-2014 post pelvic surgery and radiation 01-29-2015 to 03-10-2015  . Chronic idiopathic neutropenia (HCC)    presumed related to chemotherapy March 2006--- followed by dr gorsuch (treatment w/ G-CSF injections  . Chronic nausea   . Chronic pain    perineal/ anal  area from bladder pad irritates skin , right flank pain  . CKD stage G2/A3, GFR 60-89 and albumin  creatinine ratio >300 mg/g    nephrologist-  dr elizabeth upton  . Colovesical fistula   . Diabetic retinopathy, background (HCC)   . Difficult intravenous access    small veins--- hx PICC lines  . DM type 2 (diabetes mellitus, type 2) (HCC)    monitored by dr michael altheimer  . Dysuria   . Environmental and seasonal allergies   . Fatty liver 02/08/2016   CT  . Generalized muscle weakness   . GERD (gastroesophageal reflux disease)   . Hiatal hernia   . History of abdominal abscess 04/16/2017   post surgery 04-01-2017  --- resolved 10/ 2018  . History of gastric polyp    2014  duodenum  . History of ileus 04/16/2017   resolved w/ no surgical intervention  . History of radiation therapy    01-29-2015 to 03-10-2015  pelvis 50.4Gy  . Hypothyroidism    monitored by dr michael altheimer  . IBS (irritable bowel syndrome)   . Ileostomy in place (HCC) 04/01/2017   created at same time colostomy takedown.  . Joint pain   . Leg edema   . Lower urinary tract symptoms (LUTS)    urge urinary  incontinence  . Mixed dyslipidemia   . Multiple thyroid nodules    Managed by Dr. Gerkin  . Nephrostomy status (HCC)   . Palpitations   . Pelvic abscess in female 04/16/2017  . PONV (postoperative nausea and vomiting)    "scopolamine patch works for me"  . Radiation-induced dermatitis    contact dermatitis , radiation completed, rash only on ankles now.  . Seasonal allergies   . Ureteral stricture, right UROLOGIT-  DR MANNY   CHRONIC--  TREATMENT URETERAL STENT  . 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 Vitamin D deficiency   . Wears glasses     PAST SURGICAL HISTORY: Past Surgical History:  Procedure Laterality Date  . APPENDECTOMY    . biopsy thyroid nodules    . BREAST RECONSTRUCTION WITH PLACEMENT OF TISSUE EXPANDER AND FLEX HD (ACELLULAR HYDRATED DERMIS) Bilateral 12/11/2015   Procedure: BILATERAL BREAST RECONSTRUCTION WITH PLACEMENT OF TISSUE EXPANDERS;  Surgeon: Brinda  Thimmappa, MD;  Location: [facility];  Service: Plastics;  Laterality: Bilateral;  . COLONOSCOPY WITH PROPOFOL N/A 08/21/2013   Procedure: COLONOSCOPY WITH PROPOFOL;  Surgeon: Robert V Buccini, MD;  Location: [facility];  Service: Endoscopy;    Laterality: N/A;  . COLOSTOMY TAKEDOWN N/A 12/04/2014   Procedure: LAPROSCOPIC LYSIS OF ADHESIONS, SPLENIC MOBILIZATION, RELOCATION OF COLOSTOMY, DEBRIDEMENT INITIAL COLOSTOMY SITE;  Surgeon: Steven Gross, MD;  Location: [facility];  Service: General;  Laterality: N/A;  . CYSTOGRAM N/A 06/01/2017   Procedure: CYSTOGRAM;  Surgeon: Manny, Theodore, MD;  Location: [facility];  Service: Urology;  Laterality: N/A;  . CYSTOSCOPY W/ RETROGRADES Right 11/21/2015   Procedure: CYSTOSCOPY WITH RETROGRADE PYELOGRAM;  Surgeon: Theodore Manny, MD;  Location: [facility];  Service: Urology;  Laterality: Right;  . CYSTOSCOPY W/ URETERAL STENT PLACEMENT Right 11/21/2015   Procedure: CYSTOSCOPY WITH STENT REPLACEMENT;  Surgeon: Theodore Manny, MD;  [facility];  Service: Urology;  Laterality: Right;  . CYSTOSCOPY W/ URETERAL STENT PLACEMENT Right 03/10/2016   Procedure: CYSTOSCOPY WITH STENT REPLACEMENT;  Surgeon: Theodore Manny, MD;  Location: [facility];  Service: Urology;  Laterality: Right;  . CYSTOSCOPY W/ URETERAL STENT PLACEMENT Right 06/30/2016   Procedure: CYSTOSCOPY WITH RETROGRADE PYELOGRAM/URETERAL STENT EXCHANGE;  Surgeon: Theodore Manny, MD;  Location: [facility];  Service: Urology;  Laterality: Right;  . CYSTOSCOPY W/ URETERAL STENT PLACEMENT N/A 06/01/2017   Procedure: CYSTOSCOPY WITH EXAM UNDER ANESTHESIA;  Surgeon: Manny, Theodore, MD;  [facility];  Service: Urology;  Laterality: N/A;  . CYSTOSCOPY W/ URETERAL STENT PLACEMENT Right 08/17/2017   Procedure: CYSTOSCOPY WITH RETROGRADE PYELOGRAM/URETERAL STENT REMOVAL;  Surgeon: Manny, Theodore, MD;  Location: [facility];  Service: Urology;  Laterality: Right;  . CYSTOSCOPY WITH  RETROGRADE PYELOGRAM, URETEROSCOPY AND STENT PLACEMENT Right 03/20/2015   Procedure: CYSTOSCOPY WITH RETROGRADE PYELOGRAM, URETEROSCOPY WITH BALLOON DILATION AND STENT PLACEMENT ON RIGHT;  Surgeon: Theodore Manny, MD;  Location: [facility];  Service: Urology;  Laterality: Right;  . CYSTOSCOPY WITH RETROGRADE PYELOGRAM, URETEROSCOPY AND STENT PLACEMENT Right 05/02/2015   Procedure: CYSTOSCOPY WITH RIGHT RETROGRADE PYELOGRAM,  DIAGNOSTIC URETEROSCOPY AND STENT PULL ;  Surgeon: Theodore Manny, MD;  Location: [facility];  Service: Urology;  Laterality: Right;  . CYSTOSCOPY WITH RETROGRADE PYELOGRAM, URETEROSCOPY AND STENT PLACEMENT Right 09/05/2015   Procedure: CYSTOSCOPY WITH RETROGRADE PYELOGRAM,  AND STENT PLACEMENT;  Surgeon: Theodore Manny, MD;  [facility];  Service: Urology;  Laterality: Right;  . CYSTOSCOPY WITH RETROGRADE PYELOGRAM, URETEROSCOPY AND STENT PLACEMENT Right 04/01/2017   Procedure: CYSTOSCOPY WITH RETROGRADE PYELOGRAM, URETEROSCOPY AND STENT PLACEMENT;  Surgeon: Manny, Theodore, MD;  [facility];  Service: Urology;  Laterality: Right;  . CYSTOSCOPY WITH STENT PLACEMENT Right 10/27/2016   Procedure: CYSTOSCOPY WITH STENT CHANGE and right retrograde pyelogram;  Surgeon: Theodore Manny, MD;  Location: [facility];  Service: Urology;  Laterality: Right;  . EUS N/A 10/02/2014   Procedure: LOWER ENDOSCOPIC ULTRASOUND (EUS);  Surgeon: William Outlaw, MD;  Location[facility];  Service: Endoscopy;  Laterality: N/A;  . EXCISION SOFT TISSUE MASS RIGHT FOREMAN  12-08-2006  . EYE SURGERY  as child   pytosis of eyelids repair  . INCISION AND DRAINAGE OF WOUND Bilateral 12/26/2015   Procedure: DEBRIDEMENT OF BILATERAL MASTECTOMY FLAPS;  Surgeon: Brinda Thimmappa, MD;  Location: [facility];  Service: Plastics;  Laterality: Bilateral;  . IR CV LINE INJECTION  05/31/2017  . IR FLUORO GUIDE CV LINE LEFT  05/31/2017  . IR FLUORO  GUIDE CV LINE RIGHT  04/06/2017  . IR FLUORO GUIDE CV MIDLINE PICC RIGHT  05/30/2017  . IR NEPHROSTOGRAM LEFT INITIAL PLACEMENT  09/02/2017  . IR NEPHROSTOGRAM LEFT THRU EXISTING   ACCESS  11/29/2017  . IR NEPHROSTOGRAM RIGHT INITIAL PLACEMENT  09/02/2017  . IR NEPHROSTOGRAM RIGHT THRU EXISTING ACCESS  09/13/2017  . IR NEPHROSTOGRAM RIGHT THRU EXISTING ACCESS  11/29/2017  . IR NEPHROSTOMY EXCHANGE LEFT  11/28/2017  . IR NEPHROSTOMY EXCHANGE LEFT  01/05/2018  . IR NEPHROSTOMY EXCHANGE LEFT  02/16/2018  . IR NEPHROSTOMY EXCHANGE LEFT  03/30/2018  . IR NEPHROSTOMY EXCHANGE LEFT  05/12/2018  . IR NEPHROSTOMY EXCHANGE LEFT  06/21/2018  . IR NEPHROSTOMY EXCHANGE LEFT  08/04/2018  . IR NEPHROSTOMY EXCHANGE RIGHT  10/02/2017  . IR NEPHROSTOMY EXCHANGE RIGHT  11/28/2017  . IR NEPHROSTOMY EXCHANGE RIGHT  01/05/2018  . IR NEPHROSTOMY EXCHANGE RIGHT  02/16/2018  . IR NEPHROSTOMY EXCHANGE RIGHT  03/30/2018  . IR NEPHROSTOMY EXCHANGE RIGHT  05/12/2018  . IR NEPHROSTOMY EXCHANGE RIGHT  06/21/2018  . IR NEPHROSTOMY EXCHANGE RIGHT  08/04/2018  . IR NEPHROSTOMY PLACEMENT LEFT  10/02/2017  . IR RADIOLOGIST EVAL & MGMT  05/03/2017  . IR US GUIDE VASC ACCESS LEFT  05/31/2017  . IR US GUIDE VASC ACCESS RIGHT  04/06/2017  . IR US GUIDE VASC ACCESS RIGHT  05/30/2017  . LAPAROSCOPIC CHOLECYSTECTOMY  1990  . LIPOSUCTION WITH LIPOFILLING Bilateral 04/16/2016   Procedure: LIPOSUCTION WITH LIPOFILLING TO BILATERAL CHEST;  Surgeon: Irene Limbo, MD;  Location: [place];  Service: Plastics;  Laterality: Bilateral;  . MASTECTOMY W/ SENTINEL NODE BIOPSY Bilateral 12/11/2015   Procedure: RIGHT PROPHYLACTIC MASTECTOMY, LEFT TOTAL MASTECTOMY WITH LEFT AXILLARY SENTINEL LYMPH NODE BIOPSY;  Surgeon: Stark Klein, MD;  Location: [place];  Service: General;  Laterality: Bilateral;  . OSTOMY N/A 11/19/2014   Procedure: OSTOMY;  Surgeon: Michael [name], MD;  [facility];  Service: General;  Laterality: N/A;  . PROCTOSCOPY N/A 04/01/2017     Procedure: RIDGE PROCTOSCOPY;  Surgeon: Michael [name], MD;  [facility];  Service: General;  Laterality: N/A;  . REMOVAL OF BILATERAL TISSUE EXPANDERS WITH PLACEMENT OF BILATERAL BREAST IMPLANTS Bilateral 04/16/2016   Procedure: REMOVAL OF BILATERAL TISSUE EXPANDERS WITH PLACEMENT OF BILATERAL BREAST IMPLANTS;  Surgeon: Irene Limbo, MD;  Location: [place];  Service: Plastics;  Laterality: Bilateral;  . ROBOTIC ASSISTED LAP VAGINAL HYSTERECTOMY N/A 11/19/2014   Procedure: ROBOTIC LYSIS OF ADHESIONS, CONVERTED TO LAPAROTOMY RADICAL UPPER VAGINECTOMY,LOW ANTERIOR BOWEL RESECTION, COLOSTOMY, BILATERAL URETERAL STENT PLACEMENT AND CYSTONOMY CLOSURE;  Surgeon: Everitt Amber, MD;  Location[facility];  Service: Gynecology;  Laterality: N/A;  . TISSUE EXPANDER FILLING Bilateral 12/26/2015   Procedure: EXPANSION OF BILATERAL CHEST TISSUE EXPANDERS (60 mL- Right; 75 mL- Left);  Surgeon: Irene Limbo, MD;  Location: [place];  Service: Plastics;  Laterality: Bilateral;  . TONSILLECTOMY    . TOTAL ABDOMINAL HYSTERECTOMY  March 2006   [facility]   and Bilateral Salpingoophorectomy/  staging for Ovarian cancer/  an  . XI ROBOTIC ASSISTED LOWER ANTERIOR RESECTION N/A 04/01/2017   Procedure: XI ROBOTIC VS LAPAROSCOPIC COLOSTOMY TAKEDOWN WITH LYSIS OF ADHESIONS.;  Surgeon: Michael [name], MD;  [facility];  Service: General;  Laterality: N/A;  ERAS PATHWAY    SOCIAL HISTORY: Social History   Tobacco Use  . Smoking status: Never Smoker  . Smokeless tobacco: Never Used  Substance Use Topics  . Alcohol use: Not Currently  . Drug use: No    FAMILY HISTORY: Family History  Problem Relation Age of Onset  . Cancer Mother 75       stomach ca  . Hypertension Mother   .  Cancer Father 1       prostate ca  . Diabetes Father   . Heart disease Father        CABG  . Hypertension Father   . Hyperlipidemia Father   . Obesity Father   . Breast cancer Maternal Aunt         dx in her 41s  . Lymphoma Paternal Aunt   . Brain cancer Paternal Grandfather   . Ovarian cancer Other   . Diabetes Sister   . Hypertension Brother y-10  . Heart disease Brother        CABG  . Diabetes Brother     ROS: Review of Systems  Constitutional: Positive for weight loss.  Endo/Heme/Allergies:       Negative hypoglycemia    PHYSICAL EXAM: Blood pressure 102/60, pulse 77, temperature 97.7 F (36.5 C), temperature source Oral, height 5' 2" (1.575 m), weight 193 lb (87.5 kg), SpO2 96 %. Body mass index is 35.3 kg/m. Physical Exam Vitals signs reviewed.  Constitutional:      Appearance: Normal appearance. She is obese.  Cardiovascular:     Rate and Rhythm: Normal rate.     Pulses: Normal pulses.  Pulmonary:     Effort: Pulmonary effort is normal.     Breath sounds: Normal breath sounds.  Musculoskeletal: Normal range of motion.  Skin:    General: Skin is warm and dry.  Neurological:     Mental Status: She is alert and oriented to person, place, and time.  Psychiatric:        Mood and Affect: Mood normal.        Behavior: Behavior normal.     RECENT LABS AND TESTS: BMET    Component Value Date/Time   NA 135 05/19/2018 1127   NA 141 01/24/2017 1228   K 4.3 05/19/2018 1127   K 4.6 01/24/2017 1228   CL 104 05/19/2018 1127   CO2 22 05/19/2018 1127   CO2 29 01/24/2017 1228   GLUCOSE 183 (H) 05/19/2018 1127   GLUCOSE 84 01/24/2017 1228   BUN 38 (H) 05/19/2018 1127   BUN 22.2 01/24/2017 1228   CREATININE 1.90 (H) 05/19/2018 1127   CREATININE 1.98 (H) 03/27/2018 1324   CREATININE 0.8 01/24/2017 1228   CALCIUM 9.6 05/19/2018 1127   CALCIUM 10.2 01/24/2017 1228   GFRNONAA 27 (L) 05/19/2018 1127   GFRNONAA 26 (L) 03/27/2018 1324   GFRNONAA 78 12/16/2014 1530   GFRAA 31 (L) 05/19/2018 1127   GFRAA 30 (L) 03/27/2018 1324   GFRAA >89 12/16/2014 1530   Lab Results  Component Value Date   HGBA1C 7.9 (H) 09/01/2017   HGBA1C 5.3 04/01/2017   HGBA1C 5.8  (H) 09/05/2015   HGBA1C 6.8 (H) 11/21/2014   HGBA1C 6.3 (H) 11/07/2014   No results found for: INSULIN CBC    Component Value Date/Time   WBC 4.4 05/19/2018 1127   RBC 3.78 (L) 05/19/2018 1127   HGB 10.4 (L) 05/19/2018 1127   HGB 10.2 (L) 03/27/2018 1324   HGB 12.4 07/29/2017 1444   HCT 35.1 (L) 05/19/2018 1127   HCT 38.0 07/29/2017 1444   PLT 350 05/19/2018 1127   PLT 268 03/27/2018 1324   PLT 260 07/29/2017 1444   MCV 92.9 05/19/2018 1127   MCV 90.9 07/29/2017 1444   MCH 27.5 05/19/2018 1127   MCHC 29.6 (L) 05/19/2018 1127   RDW 14.7 05/19/2018 1127   RDW 15.5 (H) 07/29/2017 1444   LYMPHSABS 0.7 05/19/2018 1127  LYMPHSABS 0.8 (L) 07/29/2017 1444   MONOABS 0.5 05/19/2018 1127   MONOABS 1.0 (H) 07/29/2017 1444   EOSABS 0.1 05/19/2018 1127   EOSABS 0.0 07/29/2017 1444   BASOSABS 0.1 05/19/2018 1127   BASOSABS 0.0 07/29/2017 1444   Iron/TIBC/Ferritin/ %Sat    Component Value Date/Time   IRON 7 (L) 08/31/2017 0451   IRON 12 (L) 11/29/2014 1251   TIBC 164 (L) 08/31/2017 0451   TIBC 331 11/29/2014 1251   FERRITIN 27 11/29/2014 1251   IRONPCTSAT 4 (L) 08/31/2017 0451   IRONPCTSAT 4 (L) 11/29/2014 1251   Lipid Panel     Component Value Date/Time   CHOL 109 08/28/2015 1617   TRIG 89 04/18/2017 0427   HDL 43.80 08/28/2015 1617   CHOLHDL 2 08/28/2015 1617   VLDL 19.8 08/28/2015 1617   LDLCALC 45 08/28/2015 1617   Hepatic Function Panel     Component Value Date/Time   PROT 8.5 (H) 05/19/2018 1127   PROT 6.9 01/24/2017 1228   ALBUMIN 2.8 (L) 05/19/2018 1127   ALBUMIN 3.4 (L) 01/24/2017 1228   AST 30 05/19/2018 1127   AST 14 (L) 03/27/2018 1324   AST 16 01/24/2017 1228   ALT 54 (H) 05/19/2018 1127   ALT 18 03/27/2018 1324   ALT 14 01/24/2017 1228   ALKPHOS 364 (H) 05/19/2018 1127   ALKPHOS 90 01/24/2017 1228   BILITOT 0.4 05/19/2018 1127   BILITOT 0.2 (L) 03/27/2018 1324   BILITOT 0.34 01/24/2017 1228      Component Value Date/Time   TSH 2.408 08/30/2017  0623   TSH 0.63 08/28/2015 1617      OBESITY BEHAVIORAL INTERVENTION VISIT  Today's visit was # 7   Starting weight: 208 lbs Starting date: 04/05/18 Today's weight : 193 lbs Today's date: 08/14/2018 Total lbs lost to date: 15    ASK: We discussed the diagnosis of obesity with Daishia B Soyars today and [name] agreed to give us permission to discuss obesity behavioral modification therapy today.  ASSESS: Leandrea has the diagnosis of obesity and her BMI today is 35.29 Delise is in the action stage of change   ADVISE: Erinne was educated on the multiple health risks of obesity as well as the benefit of weight loss to improve her health. She was advised of the need for long term treatment and the importance of lifestyle modifications to improve her current health and to decrease her risk of future health problems.  AGREE: Multiple dietary modification options and treatment options were discussed and  [place] agreed to follow the recommendations documented in the above note.  ARRANGE: Jadan was educated on the importance of frequent visits to treat obesity as outlined per CMS and USPSTF guidelines and agreed to schedule her next follow up appointment today.  I, Sharon Martin, am acting as transcriptionist for Caren Beasley, MD  I have reviewed the above documentation for accuracy and completeness, and I agree with the above. -Caren Beasley, MD   

## 2018-08-17 ENCOUNTER — Ambulatory Visit
Admission: RE | Admit: 2018-08-17 | Discharge: 2018-08-17 | Disposition: A | Discharge status: Home / Self Care | Payer: BLUE CROSS/BLUE SHIELD | Source: Ambulatory Visit | Attending: Nephrology | Admitting: Nephrology

## 2018-08-17 ENCOUNTER — Other Ambulatory Visit: Payer: Self-pay

## 2018-08-17 DIAGNOSIS — Z0389 Encounter for observation for other suspected diseases and conditions ruled out: Secondary | ICD-10-CM | POA: Diagnosis not present

## 2018-08-17 DIAGNOSIS — I8290 Acute embolism and thrombosis of unspecified vein: Secondary | ICD-10-CM

## 2018-08-21 IMAGING — DX DG ABDOMEN ACUTE W/ 1V CHEST
3 series · 3 of 3 positions shown · non-contrast
Comparison: None.

CLINICAL DATA: Shortness of breath on exertion.

EXAM:
DG ABDOMEN ACUTE W/ 1V CHEST

[chest pa]
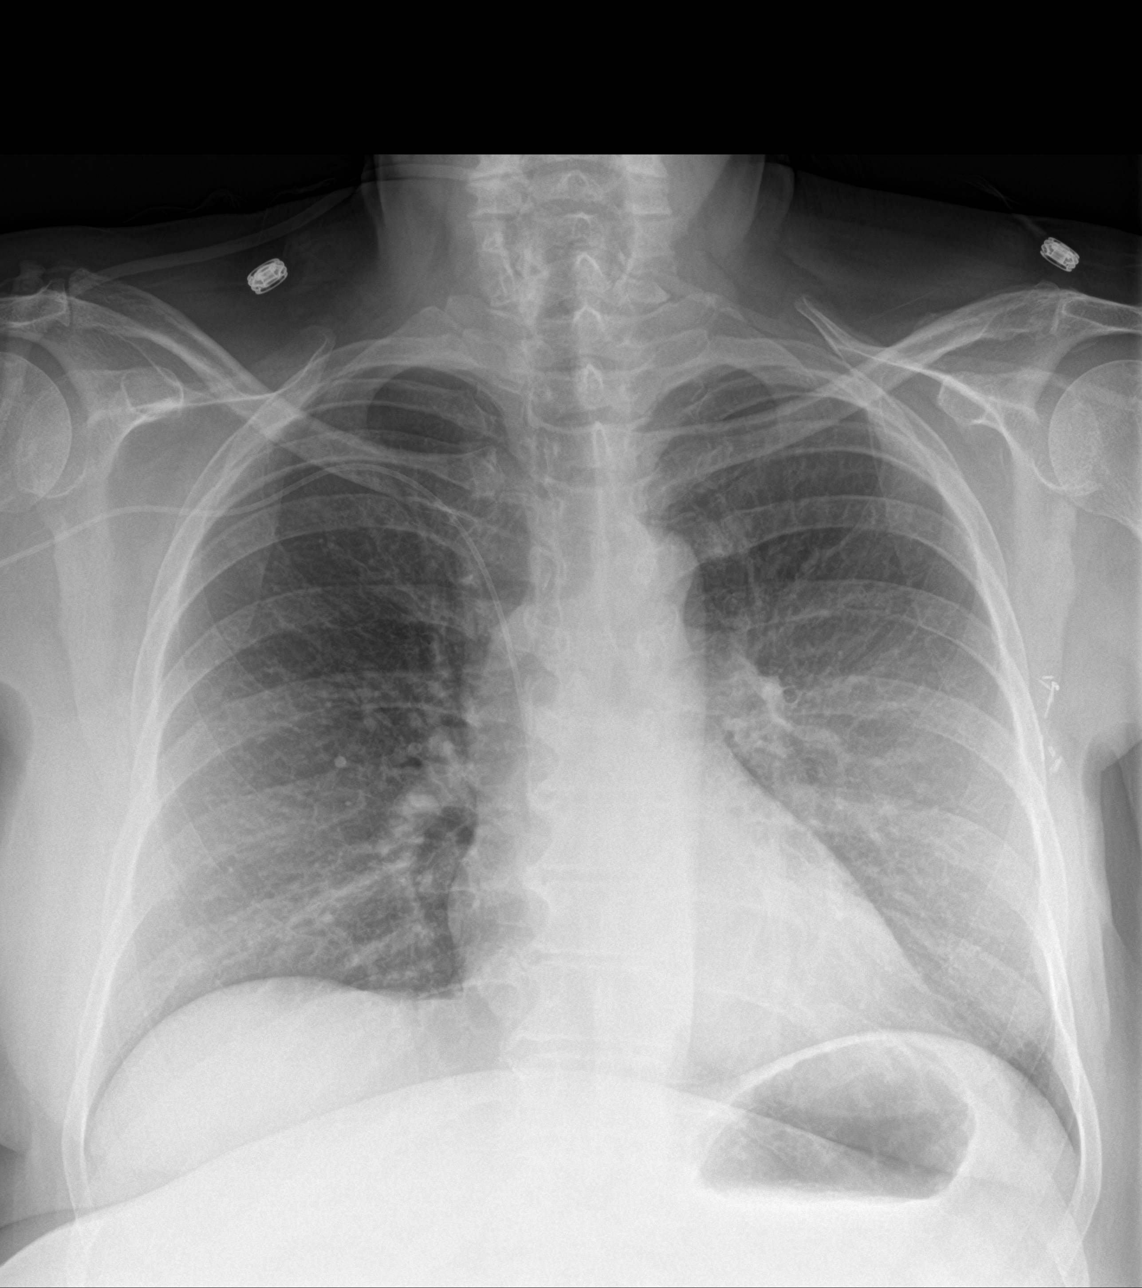

[abdomen erect]
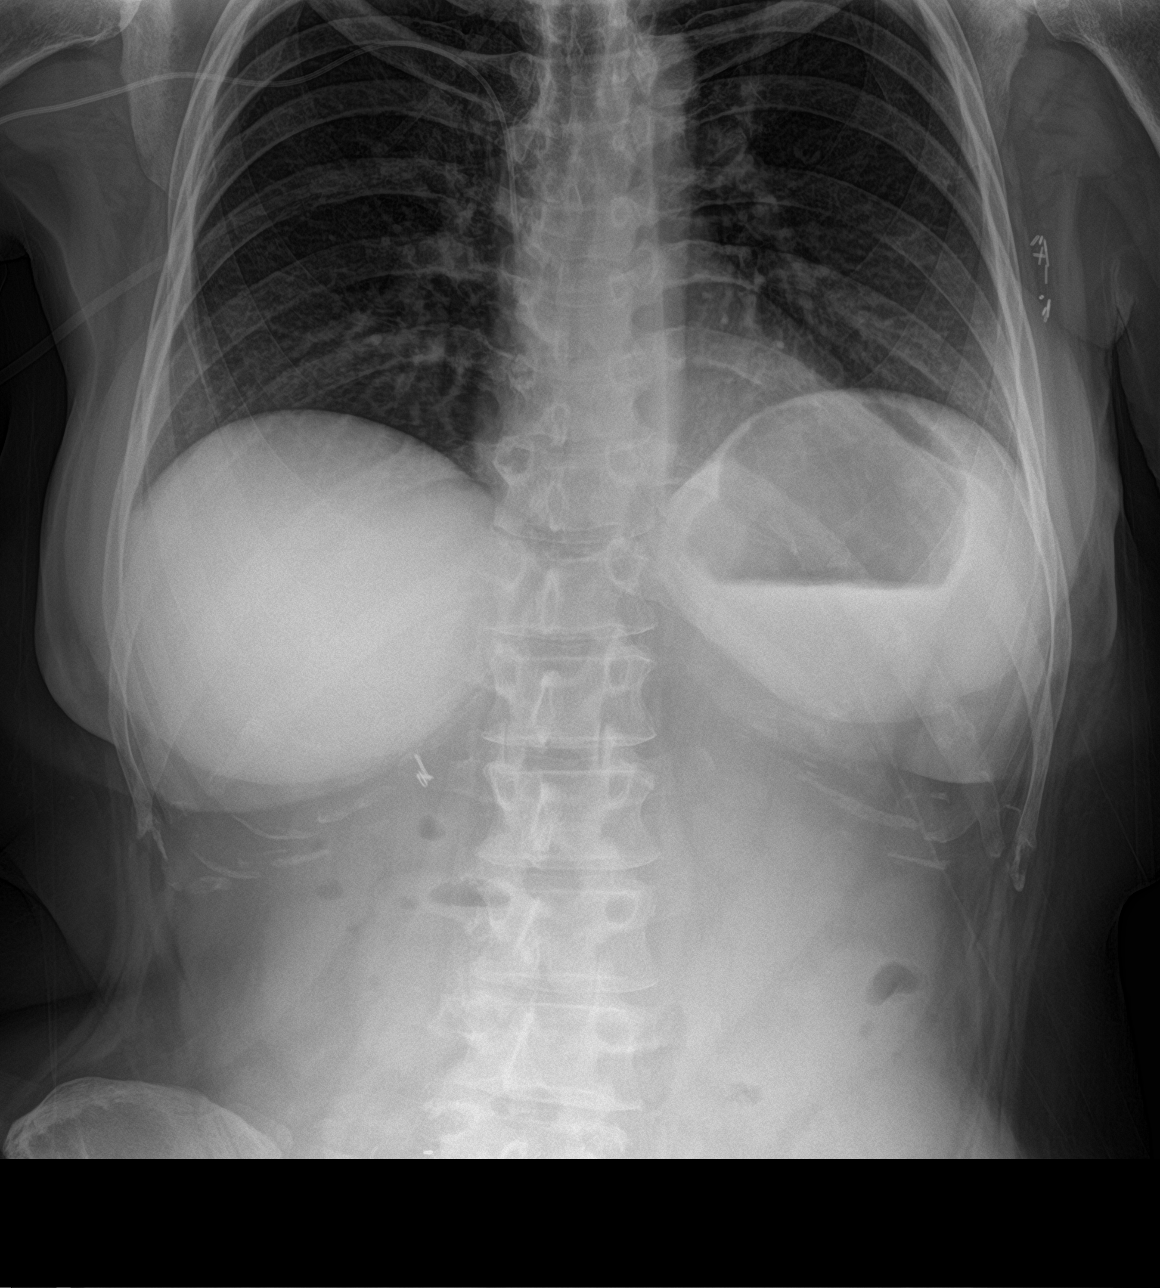

[abdomen supine]
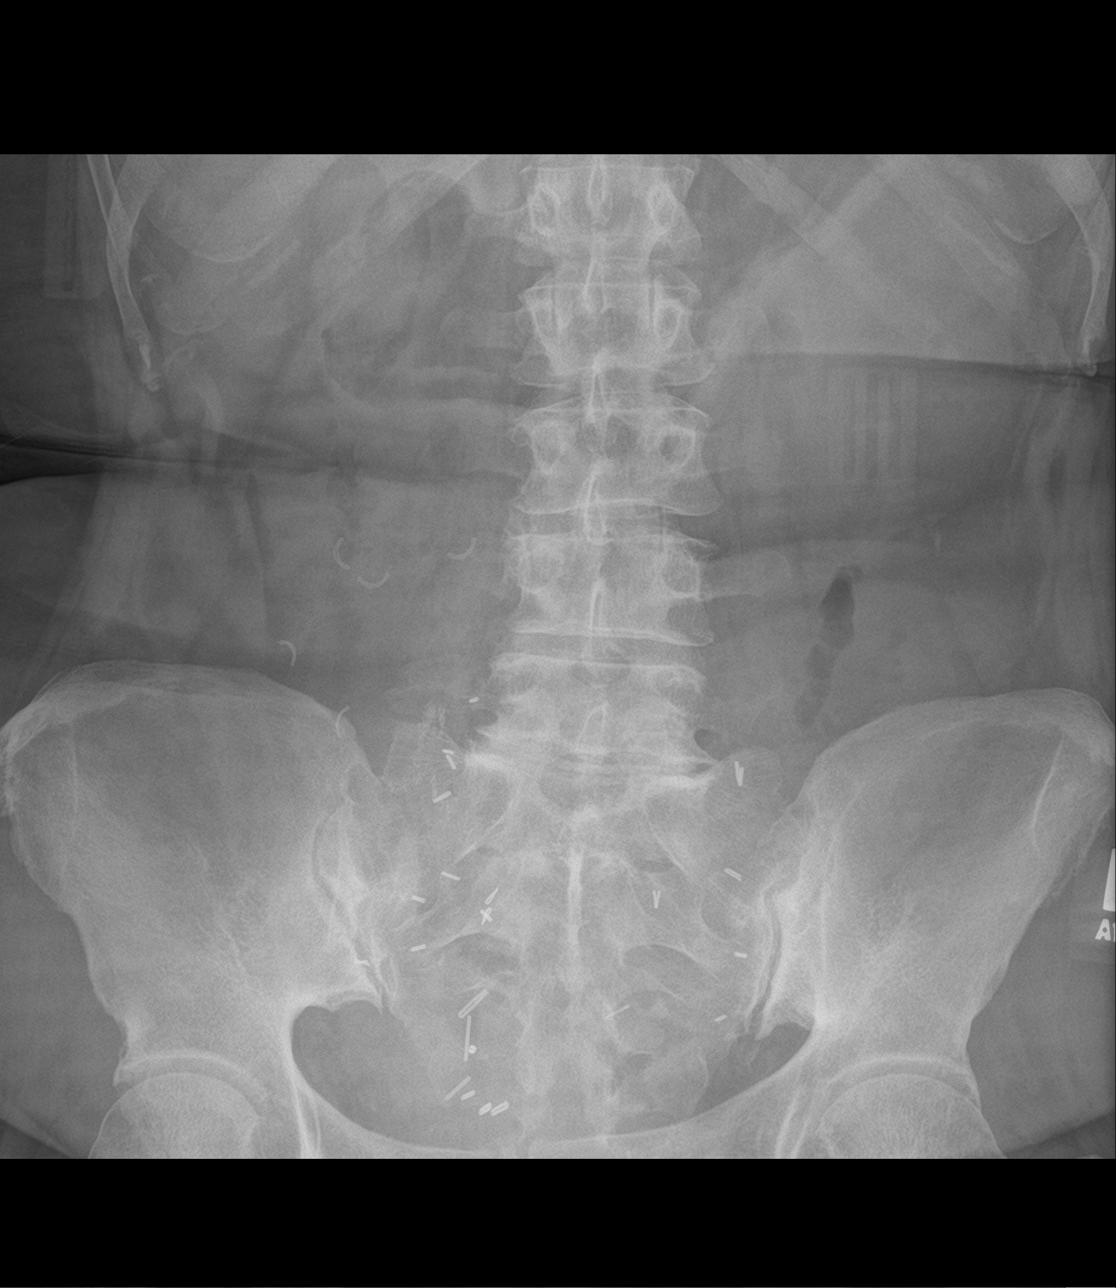

[3 of 3 positions shown; findings below may reference images not displayed]

FINDINGS: There is no evidence of dilated bowel loops or free intraperitoneal
air. Status post cholecystectomy. Surgical clips are seen in the
pelvis. Heart size and mediastinal contours are within normal
limits. Both lungs are clear.
IMPRESSION: No evidence of bowel obstruction or ileus. No acute cardiopulmonary
disease.

## 2018-08-22 ENCOUNTER — Ambulatory Visit: Payer: BLUE CROSS/BLUE SHIELD | Admitting: Family Medicine

## 2018-08-22 ENCOUNTER — Encounter: Payer: Self-pay | Admitting: Family Medicine

## 2018-08-22 VITALS — BP 118/60 | HR 73 | Temp 97.8°F | Resp 16 | Ht 62.0 in | Wt 196.6 lb

## 2018-08-22 DIAGNOSIS — R109 Unspecified abdominal pain: Secondary | ICD-10-CM

## 2018-08-22 DIAGNOSIS — G8928 Other chronic postprocedural pain: Secondary | ICD-10-CM

## 2018-08-22 DIAGNOSIS — C55 Malignant neoplasm of uterus, part unspecified: Secondary | ICD-10-CM

## 2018-08-22 LAB — POC URINALSYSI DIPSTICK (AUTOMATED)
Bilirubin, UA: NEGATIVE
Glucose, UA: NEGATIVE
KETONES UA: NEGATIVE
Nitrite, UA: POSITIVE
Protein, UA: POSITIVE — AB
SPEC GRAV UA: 1.025 (ref 1.010–1.025)
Urobilinogen, UA: 0.2 E.U./dL
pH, UA: 6 (ref 5.0–8.0)

## 2018-08-22 IMAGING — CT CT ABD-PELV W/O CM
2 of 4 series · 15 of 46 positions shown, 17 images · non-contrast
Comparison: CT the abdomen and pelvis 08/25/2017.

CLINICAL DATA: 62-year-old female status post bladder
reconstruction in March 2017 for urinary leak. History of ovarian
and uterine cancer in 4221 with recurrence in 7790 treated with
chemotherapy and radiation therapy.

EXAM:
CT ABDOMEN AND PELVIS WITHOUT CONTRAST
TECHNIQUE: Multidetector CT imaging of the abdomen and pelvis was performed
following the standard protocol without IV contrast.

[Series 2: cystogram · axial · 0.77mm/px · z∈[-595,-190]mm · 12 of 91 slices shown, 14 images]
[im 5/91  soft-tissue]
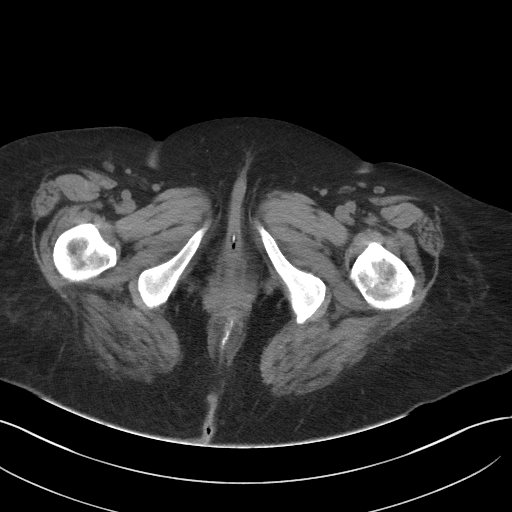
[im 5/91  bone]
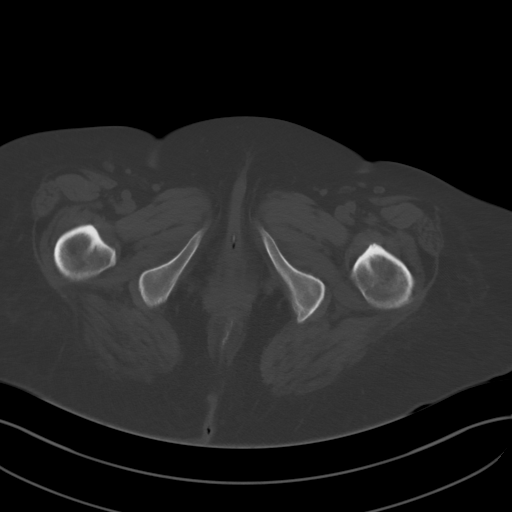
[im 15/91  soft-tissue]
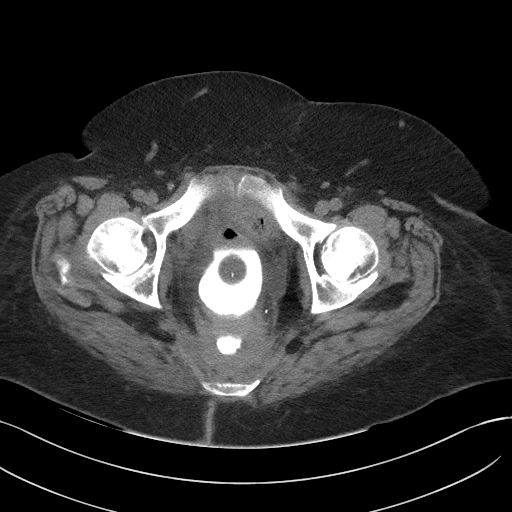
[im 19/91  soft-tissue]
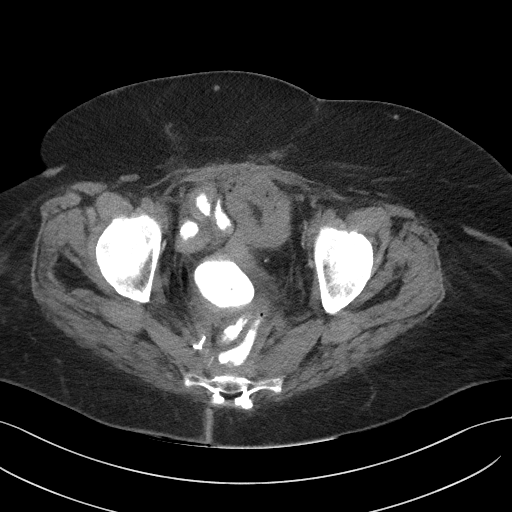
[im 29/91  soft-tissue]
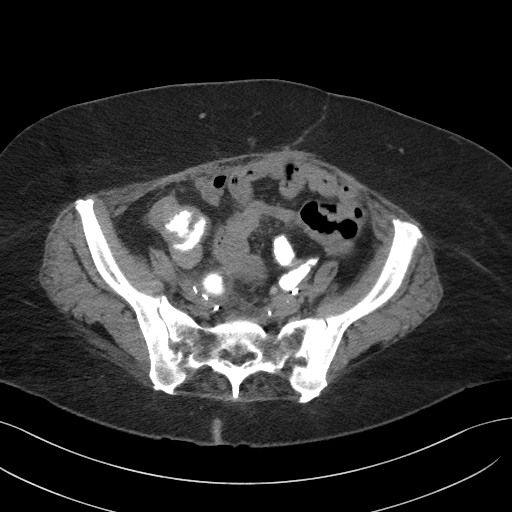
[im 34/91  soft-tissue]
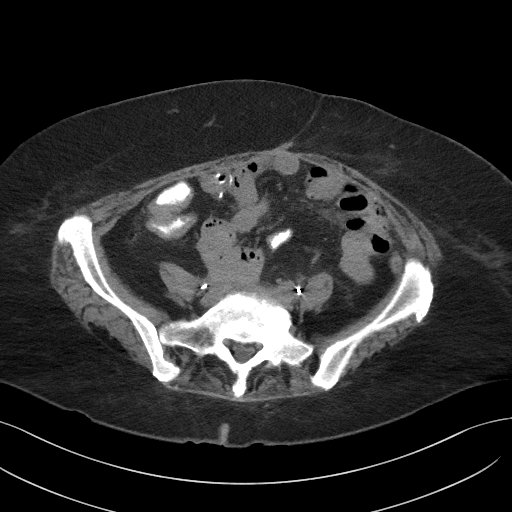
[im 43/91  soft-tissue]
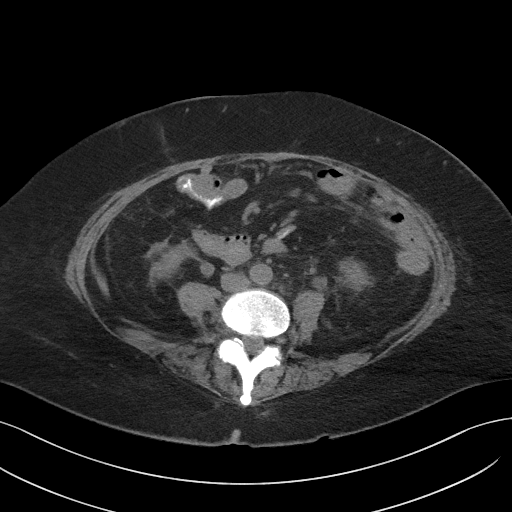
[im 48/91  soft-tissue]
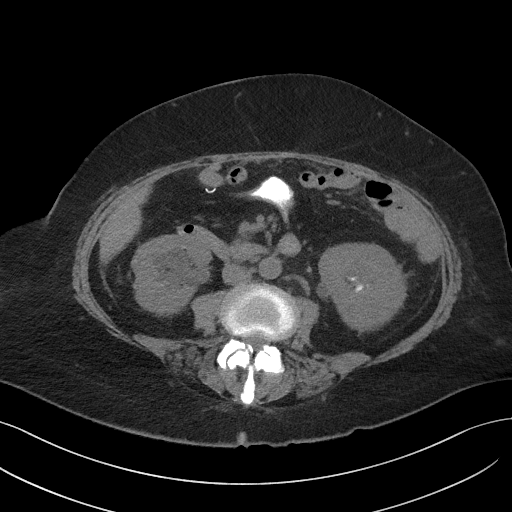
[im 57/91  soft-tissue]
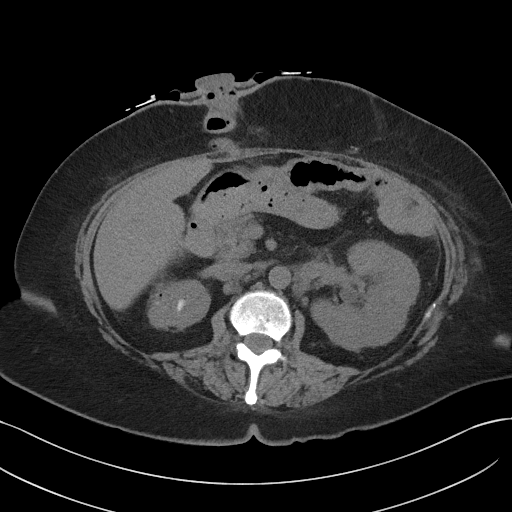
[im 62/91  soft-tissue]
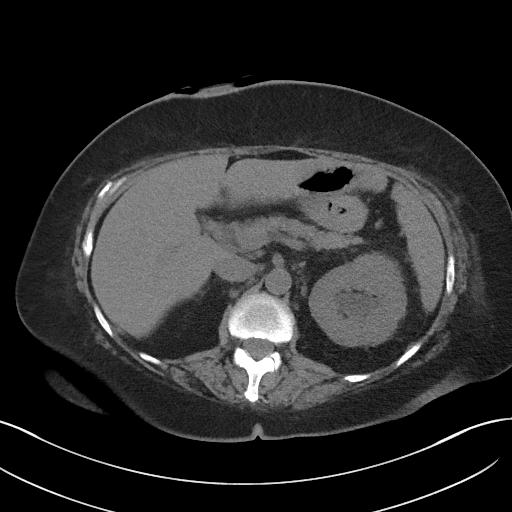
[im 62/91  bone]
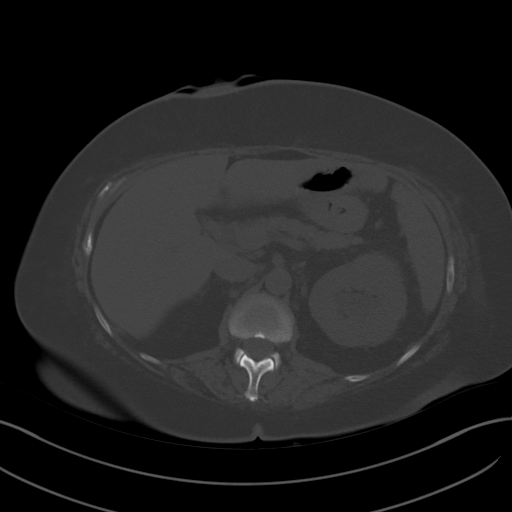
[im 72/91  soft-tissue]
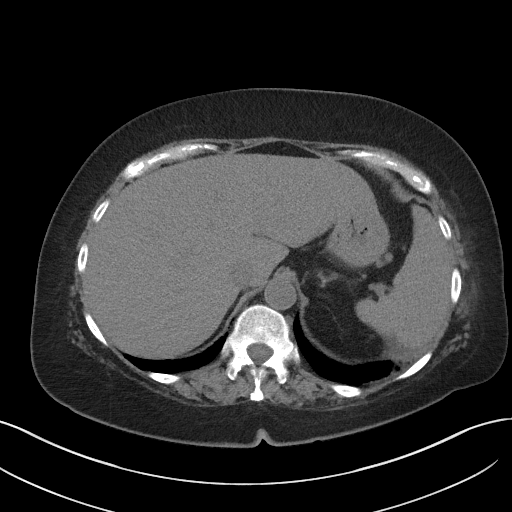
[im 76/91  soft-tissue]
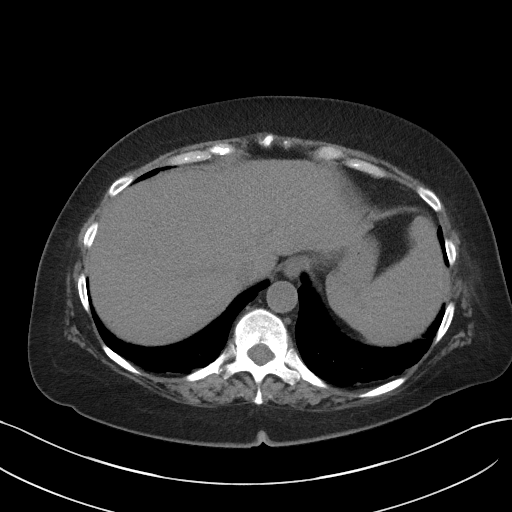
[im 86/91  soft-tissue]
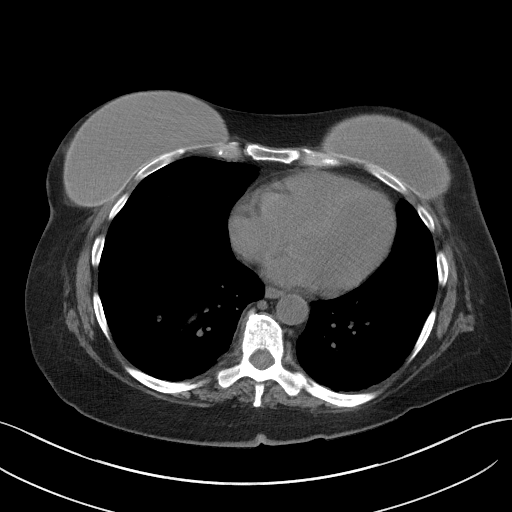

[Series 4: coronal · coronal · 0.88mm/px · 3 of 80 slices shown]
[im 27/80  soft-tissue]
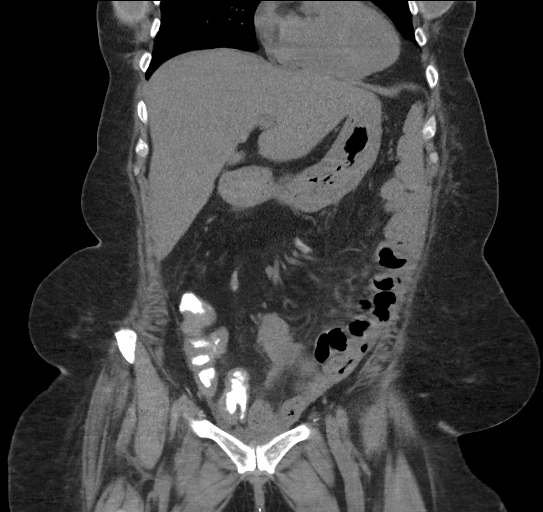
[im 36/80  soft-tissue]
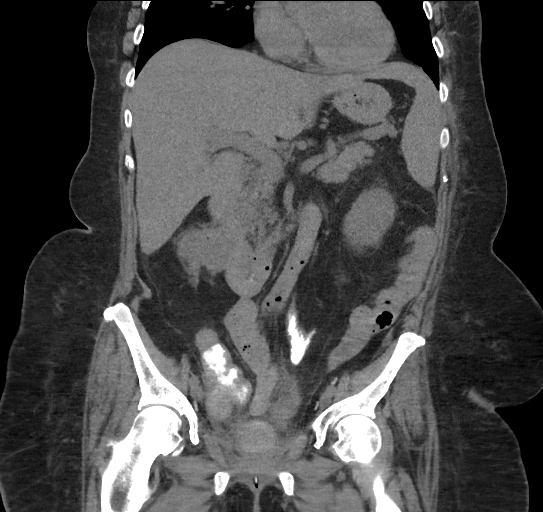
[im 44/80  soft-tissue]
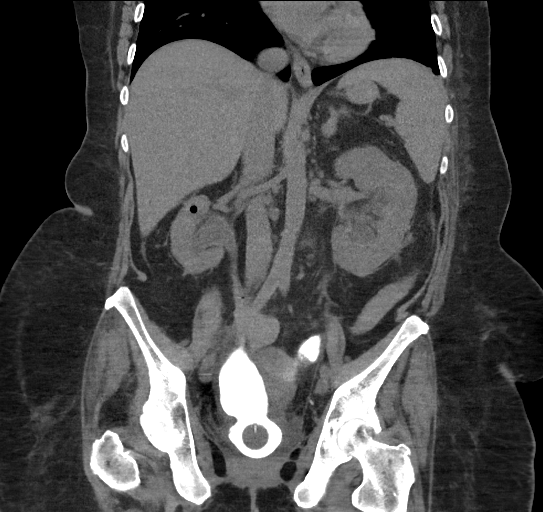

[15 of 46 positions shown; findings below may reference images not displayed]

FINDINGS: Lower chest: Bilateral breast implants incidentally noted.
Otherwise, unremarkable.

Hepatobiliary: Mild diffuse low attenuation throughout the hepatic
parenchyma indicative of a background of mild hepatic steatosis.
Calcified granuloma in the right lobe of the liver. No definite
suspicious cystic or solid hepatic lesions are noted on today's
noncontrast CT examination. Status post cholecystectomy.

Pancreas: No pancreatic mass or peripancreatic inflammatory changes
are noted on today's noncontrast CT examination.

Spleen: Unremarkable.

Adrenals/Urinary Tract: Moderate atrophy of the right kidney. Mild
bilateral hydroureteronephrosis. Reflux of iodinated contrast
material into the right renal collecting system. Nonobstructive
calculi in the lower pole collecting system of left kidney, similar
to prior study from 08/25/2017, measuring up to 11 mm. Urinary
bladder is partially decompressed around an indwelling Foley
catheter. Rectovesical fistula best appreciated on axial image 76 of
series 2, sagittal images 40-53 of series 5 and coronal images 55-60
of series 4, with large amount of either native contrast material
extending from the urinary bladder into the rectum. Bilateral
adrenal glands are normal in appearance.

Stomach/Bowel: Normal appearance of the stomach. No pathologic
dilatation of small bowel or colon. Postoperative changes of partial
colectomy and small bowel resections are noted, with right upper
quadrant ostomy. Adjacent to the rectovesical fistula there is a
short extension of contrast which extends posteriorly into the
presacral space and extends slightly cephalad, presumably a
fistulous tract (best demonstrated on axial image 71 of series 2,
sagittal images 50-58 of series 5 and coronal images 57-64 of series
4.

Vascular/Lymphatic: No atherosclerotic calcifications in the
abdominal or pelvic vasculature. No lymphadenopathy noted in the
abdomen or pelvis.

Reproductive: Status post total abdominal hysterectomy and bilateral
salpingo-oophorectomy.

Other: Trace volume of ascites in the low anatomic pelvis. No
pneumoperitoneum.

Musculoskeletal: There are no aggressive appearing lytic or blastic
lesions noted in the visualized portions of the skeleton.
IMPRESSION: 1. Rectovesical fistula, as above.
2. Probable blind-ending fistulous tract extending into the
presacral space from the rectum adjacent to the orifice of the
rectovesical fistula, as detailed above.
3. Reflux of contrast material into the right kidney via the right
ureter with moderate atrophy of the right kidney. Nonobstructive
calculi in the lower pole collecting system of the left kidney
measuring up to 11 mm.
4. Additional incidental findings, as above.

These results will be called to the ordering clinician or
representative by the Radiologist Assistant, and communication
documented in the PACS or zVision Dashboard.

## 2018-08-22 IMAGING — DX DG CHEST 2V
3 series · 3 of 3 positions shown · non-contrast
Comparison: Radiographs August 30, 2017.

CLINICAL DATA: Shortness of breath.

EXAM:
CHEST  2 VIEW

[chest lat (1 of 2)]
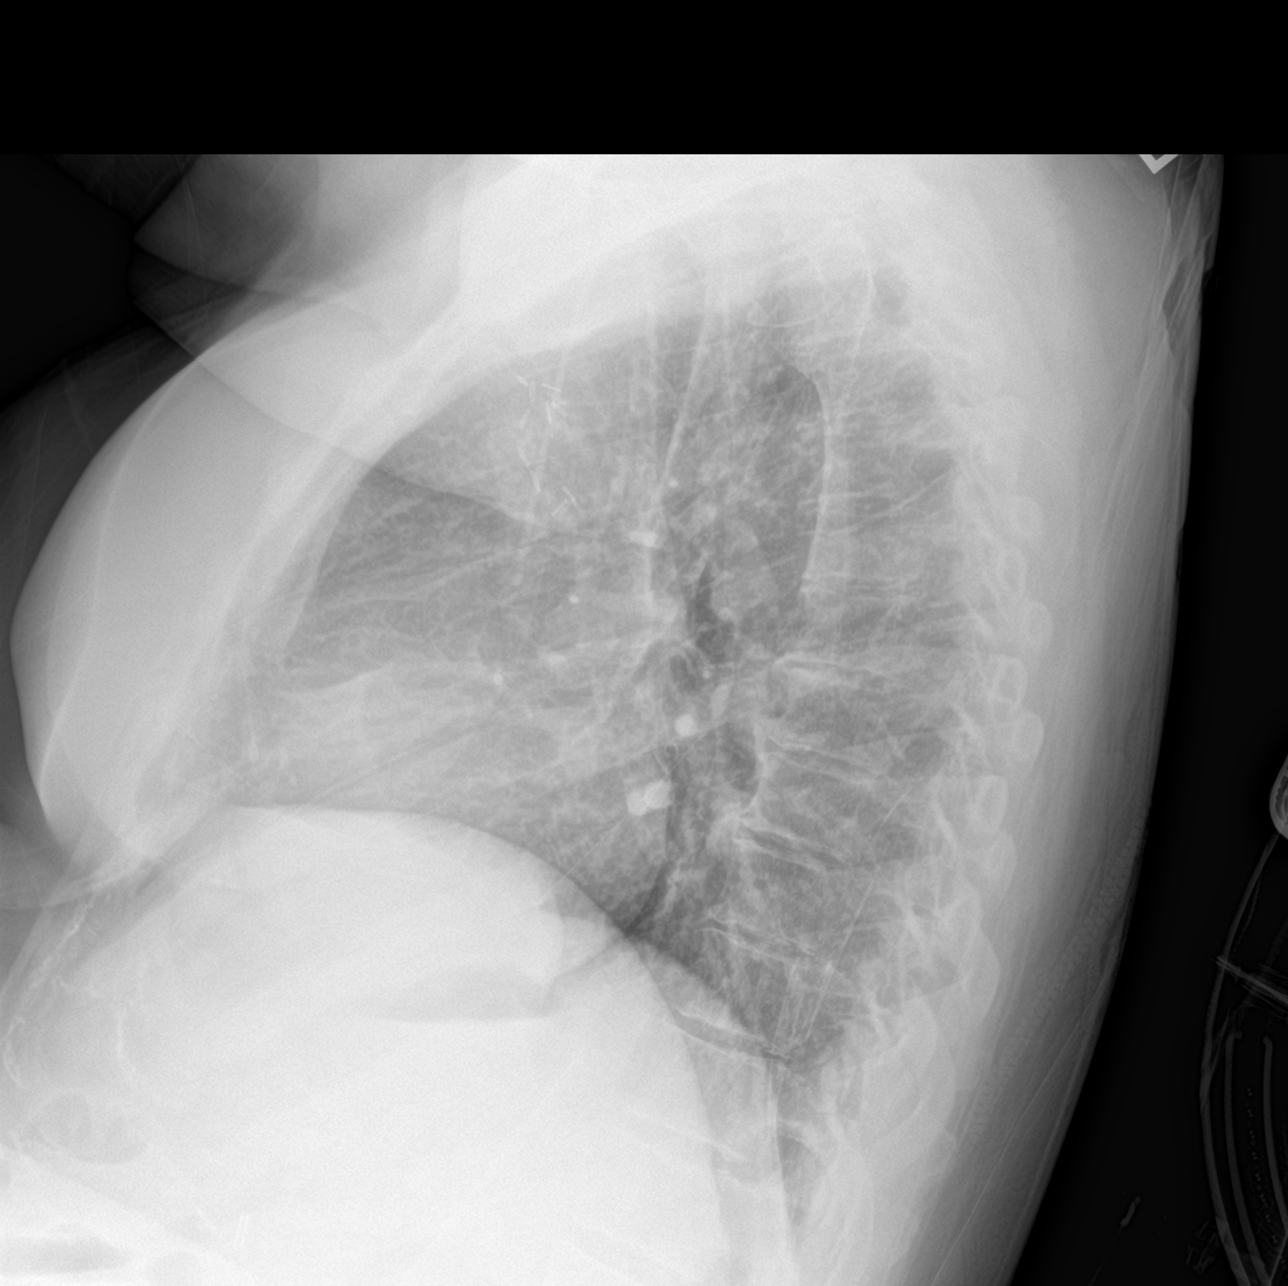

[chest ap w/grid]
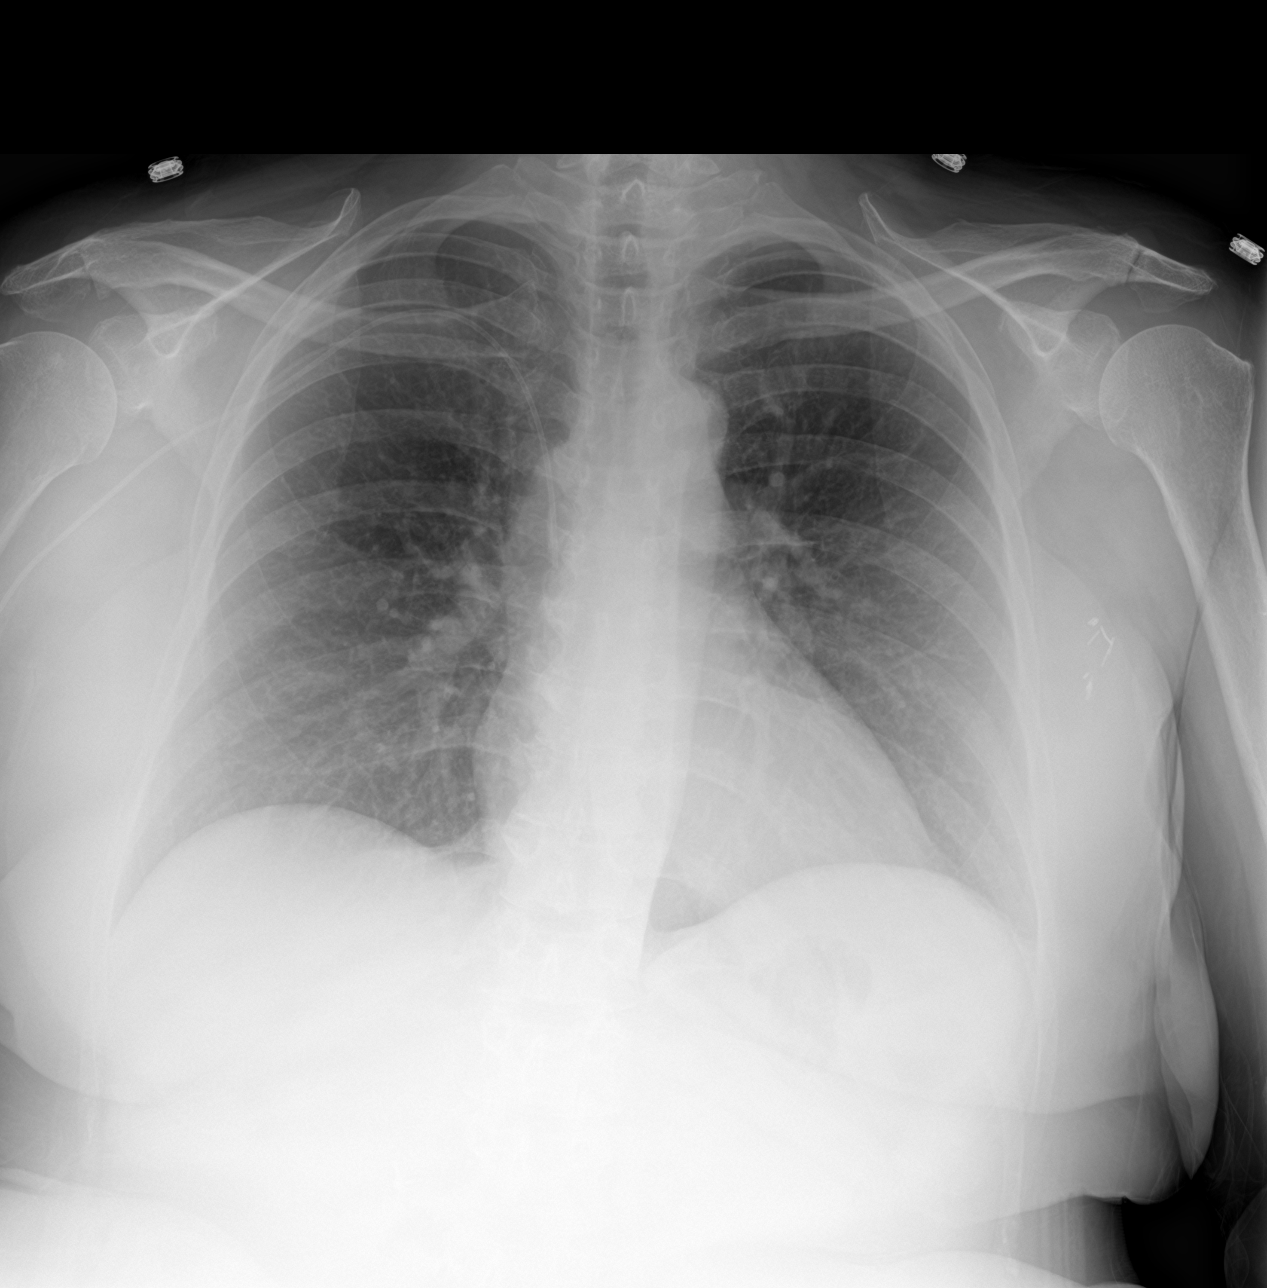

[chest lat (2 of 2)]
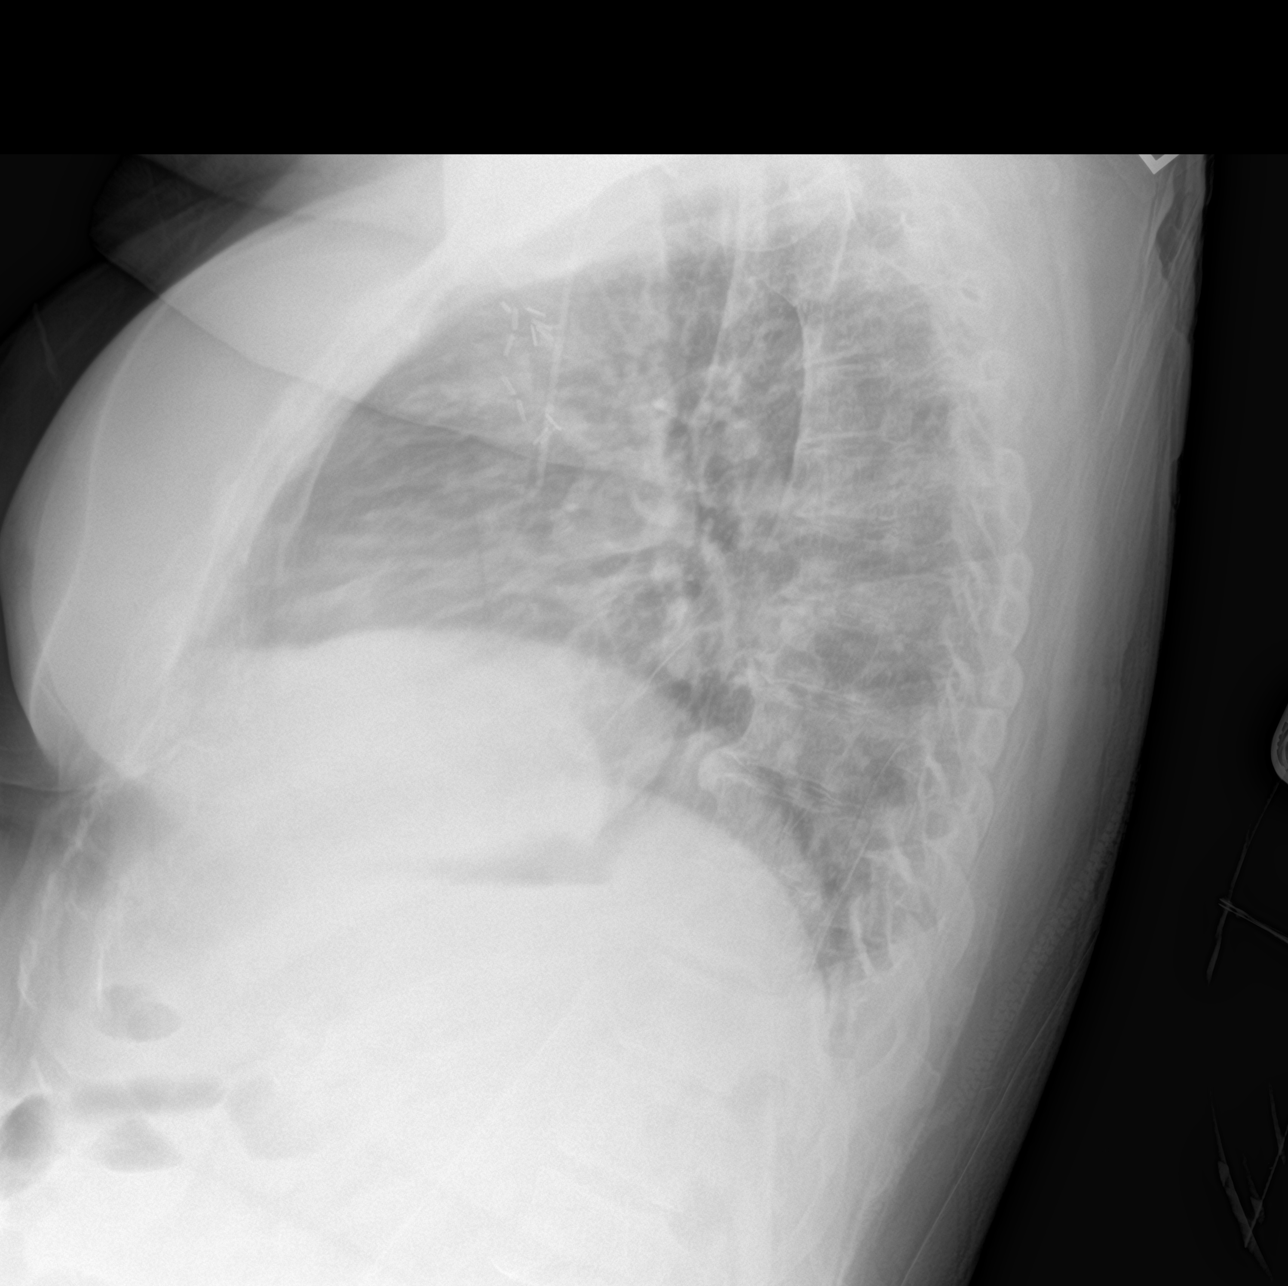

[3 of 3 positions shown; findings below may reference images not displayed]

FINDINGS: The heart size and mediastinal contours are within normal limits.
Both lungs are clear. No pneumothorax or pleural effusion is noted.
Right-sided PICC line is unchanged in position. The visualized
skeletal structures are unremarkable.
IMPRESSION: No active cardiopulmonary disease.

## 2018-08-22 MED ORDER — HYDROCODONE-ACETAMINOPHEN 5-325 MG PO TABS: 1.0000 | ORAL_TABLET | Freq: Four times a day (QID) | ORAL | 0 refills | Status: DC | PRN

## 2018-08-22 MED ORDER — FENTANYL 50 MCG/HR TD PT72: 1.0000 | MEDICATED_PATCH | TRANSDERMAL | 0 refills | Status: DC

## 2018-08-22 MED ORDER — CEFTRIAXONE SODIUM 500 MG IJ SOLR
500.0000 mg | Freq: Once | INTRAMUSCULAR | Status: AC
  Administered 2018-08-22: 500 mg via INTRAMUSCULAR

## 2018-08-22 NOTE — Progress Notes (Signed)
Patient ID: [name], female    DOB: [birth year]  Age: 63 y.o. MRN: [id number]    Subjective:  Subjective  HPI [name] presents for f/u pain meds   She also is asking for her urine to be rechecked.   She is on monurol now ---  That has not worked in the past  History Past Medical History:  Diagnosis Date  . Adrenal adenoma, left 02/08/2016   CT: stable benign  . Anemia in neoplastic disease   . Back pain   . Benign essential HTN   . 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 Cancer of corpus uteri, except isthmus [facility])  oncologist-- dr Denman [name] and dr Alvy Bimler    10-15-2004  dx endometroid endometrial and ovarian cancer s/p  chemotheapy and surgery(TAH w/ BSO) :  recurrent 11-19-2014 post pelvic surgery and radiation 01-29-2015 to 03-10-2015  . Chronic idiopathic neutropenia (HCC)    presumed related to chemotherapy March 2006--- followed by dr Alvy Bimler (treatment w/ G-CSF injections  . Chronic nausea   . Chronic pain    perineal/ anal  area from bladder pad irritates skin , right flank pain  . CKD stage G2/A3, GFR 60-89 and albumin creatinine ratio >300 mg/g    nephrologist-  dr Madelon Lips  . Colovesical fistula   . Diabetic retinopathy, background ([place])   . Difficult intravenous access    small veins--- hx PICC lines  . DM type 2 (diabetes mellitus, type 2) ([place])    monitored by dr Legrand Como altheimer  . Dysuria   . Environmental and seasonal allergies   . Fatty liver 02/08/2016   CT  . Generalized muscle weakness   . GERD (gastroesophageal reflux disease)   . Hiatal hernia   . History of abdominal abscess 04/16/2017   post surgery 04-01-2017  --- resolved 10/ 2018  . History of gastric polyp    2014  duodenum  . History of ileus 04/16/2017   resolved w/ no surgical intervention   . History of radiation therapy    01-29-2015 to 03-10-2015  pelvis 50.4Gy  . Hypothyroidism    monitored by dr Legrand Como altheimer  . IBS (irritable bowel syndrome)   . Ileostomy in place [facility]) 04/01/2017   created at same time colostomy takedown.  . Joint pain   . Leg edema   . Lower urinary tract symptoms (LUTS)    urge urinary  incontinence  . Mixed dyslipidemia   . Multiple thyroid nodules    Managed by Dr. Harlow Asa  . Nephrostomy status ([place])   . Palpitations   . Pelvic abscess in female 04/16/2017  . PONV (postoperative nausea and vomiting)    "scopolamine patch works for me"  . Radiation-induced dermatitis    contact dermatitis , radiation completed, rash only on ankles now.  . Seasonal allergies   . Ureteral stricture, right UROLOGIT-  DR [facility]   CHRONIC--  TREATMENT URETERAL STENT  . 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 Vitamin D deficiency   . Wears glasses     She has a past surgical history that includes Appendectomy; Tonsillectomy; Colonoscopy with propofol (N/A, 08/21/2013); EUS (N/A, 10/02/2014); Robotic assisted lap vaginal hysterectomy (N/A, 11/19/2014); Ostomy (N/A, 11/19/2014); Colostomy takedown (N/A, 12/04/2014); Cystoscopy with retrograde pyelogram, ureteroscopy and stent placement (Right, 03/20/2015); EXCISION SOFT TISSUE MASS  RIGHT FOREMAN (12-08-2006); Cystoscopy with retrograde pyelogram, ureteroscopy and stent placement (Right, 05/02/2015); Cystoscopy with retrograde pyelogram, ureteroscopy and stent placement (Right, 09/05/2015); Cystoscopy w/ retrogrades (Right, 11/21/2015); Cystoscopy w/ ureteral stent placement (Right, 11/21/2015); Mastectomy w/ sentinel node biopsy (Bilateral, 12/11/2015); Breast reconstruction with placement of tissue expander and flex hd (acellular hydrated dermis) (Bilateral, 12/11/2015); Incision and drainage of wound (Bilateral, 12/26/2015); Tissue expander filling (Bilateral, 12/26/2015); Laparoscopic cholecystectomy (1990); Eye surgery (as  child); Cystoscopy w/ ureteral stent placement (Right, 03/10/2016); Removal of bilateral tissue expanders with placement of bilateral breast implants (Bilateral, 04/16/2016); Liposuction with lipofilling (Bilateral, 04/16/2016); Cystoscopy w/ ureteral stent placement (Right, 06/30/2016); Cystoscopy with stent placement (Right, 10/27/2016); XI robotic assisted lower anterior resection (N/A, 04/01/2017); Proctoscopy (N/A, 04/01/2017); Cystoscopy with retrograde pyelogram, ureteroscopy and stent placement (Right, 04/01/2017); IR US Guide Vasc Access Right (04/06/2017); IR Fluoro Guide CV Line Right (04/06/2017); IR US Guide Vasc Access Right (05/30/2017); IR Fluoro Guide CV Midline PICC Right (05/30/2017); IR Fluoro Guide CV Line Left (05/31/2017); IR US Guide Vasc Access Left (05/31/2017); IR CV Line Injection (05/31/2017); IR Radiologist Eval & Mgmt (05/03/2017); Cystoscopy w/ ureteral stent placement (N/A, 06/01/2017); Cystogram (N/A, 06/01/2017); Total abdominal hysterectomy (March 2006   [facility]); Cystoscopy w/ ureteral stent placement (Right, 08/17/2017); IR NEPHROSTOGRAM RIGHT INITIAL PLACEMENT (09/02/2017); IR NEPHROSTOGRAM LEFT INITIAL PLACEMENT (09/02/2017); IR NEPHROSTOGRAM RIGHT THRU EXISTING ACCESS (09/13/2017); IR NEPHROSTOMY PLACEMENT LEFT (10/02/2017); IR NEPHROSTOMY EXCHANGE RIGHT (10/02/2017); IR NEPHROSTOMY EXCHANGE RIGHT (11/28/2017); IR NEPHROSTOMY EXCHANGE LEFT (11/28/2017); IR NEPHROSTOGRAM LEFT THRU EXISTING ACCESS (11/29/2017); IR NEPHROSTOGRAM RIGHT THRU EXISTING ACCESS (11/29/2017); IR NEPHROSTOMY EXCHANGE LEFT (01/05/2018); IR NEPHROSTOMY EXCHANGE RIGHT (01/05/2018); IR NEPHROSTOMY EXCHANGE RIGHT (02/16/2018); IR NEPHROSTOMY EXCHANGE LEFT (02/16/2018); IR NEPHROSTOMY EXCHANGE RIGHT (03/30/2018); IR NEPHROSTOMY EXCHANGE LEFT (03/30/2018); biopsy thyroid nodules; IR NEPHROSTOMY EXCHANGE RIGHT (05/12/2018); IR NEPHROSTOMY EXCHANGE LEFT (05/12/2018); IR NEPHROSTOMY EXCHANGE RIGHT (06/21/2018); IR NEPHROSTOMY EXCHANGE LEFT  (06/21/2018); IR NEPHROSTOMY EXCHANGE LEFT (08/04/2018); and IR NEPHROSTOMY EXCHANGE RIGHT (08/04/2018).   Her family history includes Brain cancer in her paternal grandfather; Breast cancer in her maternal aunt; Cancer (age of onset: 6) in her mother; Cancer (age of onset: 58) in her father; Diabetes in her brother, father, and sister; Heart disease in her brother and father; Hyperlipidemia in her father; Hypertension in her father and mother; Hypertension (age of onset: y-10) in her brother; Lymphoma in her paternal aunt; Obesity in her father; Ovarian cancer in an other family member.She reports that she has never smoked. She has never used smokeless tobacco. She reports previous alcohol use. She reports that she does not use drugs.  Current Outpatient Medications on File Prior to Visit  Medication Sig Dispense Refill  . acetaminophen (TYLENOL) 325 MG tablet Take 2 tablets (650 mg total) by mouth every 6 (six) hours as needed. 30 tablet 1  . anastrozole (ARIMIDEX) 1 MG tablet TAKE 1 TABLET DAILY (Patient taking differently: Take 1 mg by mouth daily. ) 90 tablet 3  . Biotin 5 MG TABS Take 5 mg by mouth every morning.     . Cholecalciferol (VITAMIN D3) 10000 UNITS capsule Take 10,000 Units by mouth once a week. Sunday evening's    . clobetasol (OLUX) 0.05 % topical foam Apply topically 2 (two) times daily.    . diphenhydrAMINE (BENADRYL) 25 mg capsule Take 1 capsule (25 mg total) by mouth every 8 (eight) hours as needed for itching, allergies or sleep. 30 capsule 0  . diphenoxylate-atropine (LOMOTIL) 2.5-0.025 MG tablet Take 1 tablet by mouth 4 (four) times daily as  needed for diarrhea or loose stools. TO PREVENT LOOSE BOWEL MOVEMENTS (Patient taking differently: Take 1 tablet by mouth every other day. TO PREVENT LOOSE BOWEL MOVEMENTS) 30 tablet 0  . ferrous sulfate 325 (65 FE) MG tablet Take 1 tablet (325 mg total) by mouth at bedtime. 30 tablet 3  . JANUVIA 50 MG tablet Take 50 mg by mouth daily.       [name] Kitchen levothyroxine (SYNTHROID, LEVOTHROID) 150 MCG tablet Take 1 tablet (150 mcg total) by mouth daily before breakfast. 30 tablet 1  . loratadine (CLARITIN) 10 MG tablet Take 10 mg by mouth every morning.     [name] Kitchen MONUROL 3 g PACK     . nitrofurantoin, macrocrystal-monohydrate, (MACROBID) 100 MG capsule Take 100 mg by mouth at bedtime.    [name] Kitchen omega-3 acid ethyl esters (LOVAZA) 1 G capsule Take 1 g by mouth 2 (two) times daily.     Vladimir Faster Glycol-Propyl Glycol (SYSTANE OP) Place 1 drop into both eyes daily as needed (dry eyes).     . pregabalin (LYRICA) 50 MG capsule TAKE 1 CAPSULE(50 MG) BY MOUTH TWICE DAILY 180 capsule 0  . Prenatal Vit-Fe Fumarate-FA (PRENATAL VITAMIN PO) Take 1 capsule by mouth daily. Takes prenatal because there are no dyes in it    . rosuvastatin (CRESTOR) 10 MG tablet Take 10 mg by mouth every evening.     . Saccharomyces boulardii (FLORASTOR PO) Take 1 capsule by mouth daily.    . sodium bicarbonate 650 MG tablet Take 650 mg by mouth 2 (two) times daily.    . sodium chloride 0.9 % injection Inject 10 mLs into the vein daily.    . Tbo-Filgrastim (GRANIX) 480 MCG/0.8ML SOSY injection Inject 0.8 mLs (480 mcg total) into the skin daily. (Patient taking differently: Inject 480 mcg into the skin See admin instructions. Every 6 days) 30 Syringe 11   No current facility-administered medications on file prior to visit.      Objective:  Objective  Physical Exam Vitals signs and nursing note reviewed.  Constitutional:      Appearance: She is well-developed.  HENT:     Right Ear: External ear normal.     Left Ear: External ear normal.  Eyes:     General:        Right eye: No discharge.        Left eye: No discharge.     Conjunctiva/sclera: Conjunctivae normal.  Cardiovascular:     Rate and Rhythm: Normal rate and regular rhythm.     Heart sounds: Normal heart sounds. No murmur.  Pulmonary:     Effort: Pulmonary effort is normal. No respiratory distress.     Breath  sounds: Normal breath sounds. No wheezing or rales.  Chest:     Chest wall: No tenderness.  Lymphadenopathy:     Cervical: No cervical adenopathy.  Neurological:     Mental Status: She is alert and oriented to person, place, and time.    BP 118/60 (BP Location: Left Arm, Cuff Size: Normal)   Pulse 73   Temp 97.8 F (36.6 C) (Oral)   Resp 16   Ht 5' 2"  (1.575 m)   Wt 196 lb 9.6 oz (89.2 kg)   SpO2 95%   BMI 35.96 kg/m  Wt Readings from Last 3 Encounters:  08/22/18 196 lb 9.6 oz (89.2 kg)  08/14/18 193 lb (87.5 kg)  07/28/18 204 lb (92.5 kg)     Lab Results  Component Value Date   WBC  4.4 05/19/2018   HGB 10.4 (L) 05/19/2018   HCT 35.1 (L) 05/19/2018   PLT 350 05/19/2018   GLUCOSE 183 (H) 05/19/2018   CHOL 109 08/28/2015   TRIG 89 04/18/2017   HDL 43.80 08/28/2015   LDLCALC 45 08/28/2015   ALT 54 (H) 05/19/2018   AST 30 05/19/2018   NA 135 05/19/2018   K 4.3 05/19/2018   CL 104 05/19/2018   CREATININE 1.90 (H) 05/19/2018   BUN 38 (H) 05/19/2018   CO2 22 05/19/2018   TSH 2.408 08/30/2017   INR 1.02 11/29/2017   HGBA1C 7.9 (H) 09/01/2017   MICROALBUR 5.6 (H) 08/30/2016    US Venous Img Upper Uni Right  Result Date: 08/17/2018 CLINICAL DATA:  Question clot around PICC line EXAM: RIGHT UPPER EXTREMITY VENOUS DOPPLER ULTRASOUND TECHNIQUE: Gray-scale sonography with graded compression, as well as color Doppler and duplex ultrasound were performed to evaluate the upper extremity deep venous system from the level of the subclavian vein and including the jugular, axillary, basilic, radial, ulnar and upper cephalic vein. Spectral Doppler was utilized to evaluate flow at rest and with distal augmentation maneuvers. COMPARISON:  None. FINDINGS: Contralateral Subclavian Vein: Respiratory phasicity is normal and symmetric with the symptomatic side. No evidence of thrombus. Normal compressibility. Internal Jugular Vein: No evidence of thrombus. Normal compressibility, respiratory  phasicity and response to augmentation. Subclavian Vein: No evidence of thrombus. Normal compressibility, respiratory phasicity and response to augmentation. Axillary Vein: No evidence of thrombus. Normal compressibility, respiratory phasicity and response to augmentation. Cephalic Vein: No evidence of thrombus. Normal compressibility, respiratory phasicity and response to augmentation. Basilic Vein: No evidence of thrombus. Normal compressibility, respiratory phasicity and response to augmentation. Brachial Veins: No evidence of thrombus. Normal compressibility, respiratory phasicity and response to augmentation. Radial Veins: No evidence of thrombus. Normal compressibility, respiratory phasicity and response to augmentation. Ulnar Veins: No evidence of thrombus. Normal compressibility, respiratory phasicity and response to augmentation. Venous Reflux:  None visualized. Other Findings:  None visualized. IMPRESSION: No evidence of DVT within the right upper extremity. Electronically Signed   By: Rolm Baptise M.D.   On: 08/17/2018 08:58     Assessment & Plan:  Plan  I have changed Aparna B. Vane's fentaNYL. I am also having her maintain her rosuvastatin, omega-3 acid ethyl esters, Vitamin D3, Polyethyl Glycol-Propyl Glycol (SYSTANE OP), Biotin, loratadine, Prenatal Vit-Fe Fumarate-FA (PRENATAL VITAMIN PO), acetaminophen, diphenhydrAMINE, diphenoxylate-atropine, ferrous sulfate, levothyroxine, Tbo-Filgrastim, sodium chloride, JANUVIA, anastrozole, Saccharomyces boulardii (FLORASTOR PO), clobetasol, nitrofurantoin (macrocrystal-monohydrate), sodium bicarbonate, pregabalin, MONUROL, and HYDROcodone-acetaminophen. We administered cefTRIAXone.  Meds ordered this encounter  Medications  . HYDROcodone-acetaminophen (NORCO/VICODIN) 5-325 MG tablet    Sig: Take 1 tablet by mouth every 6 (six) hours as needed for moderate pain.    Dispense:  30 tablet    Refill:  0  . fentaNYL (DURAGESIC) 50 MCG/HR    Sig: Place  1 patch onto the skin every 3 (three) days.    Dispense:  10 patch    Refill:  0  . cefTRIAXone (ROCEPHIN) injection 500 mg    Problem List Items Addressed This Visit      Unprioritized   Malignant neoplasm of uterus ([place])   Relevant Medications   fentaNYL (DURAGESIC) 50 MCG/HR   cefTRIAXone (ROCEPHIN) injection 500 mg (Completed)   Other chronic postprocedural pain   Relevant Medications   HYDROcodone-acetaminophen (NORCO/VICODIN) 5-325 MG tablet    Other Visit Diagnoses    Flank pain    -  Primary   Relevant  Medications   cefTRIAXone (ROCEPHIN) injection 500 mg (Completed)   Other Relevant Orders   POCT Urinalysis Dipstick (Automated) (Completed)   Urine Culture      Follow-up: Return in about 3 months (around 11/21/2018), or if symptoms worsen or fail to improve.  Ann Held, DO

## 2018-08-22 NOTE — Patient Instructions (Addendum)
Urinary Tract Infection, Adult  A urinary tract infection (UTI) is an infection of any part of the urinary tract. The urinary tract includes the kidneys, ureters, bladder, and urethra. These organs make, store, and get rid of urine in the body. Your health care provider may use other names to describe the infection. An upper UTI affects the ureters and kidneys (pyelonephritis). A lower UTI affects the bladder (cystitis) and urethra (urethritis). What are the causes? Most urinary tract infections are caused by bacteria in your genital area, around the entrance to your urinary tract (urethra). These bacteria grow and cause inflammation of your urinary tract. What increases the risk? You are more likely to develop this condition if:  You have a urinary catheter that stays in place (indwelling).  You are not able to control when you urinate or have a bowel movement (you have incontinence).  You are female and you: ? Use a spermicide or diaphragm for birth control. ? Have low estrogen levels. ? Are pregnant.  You have certain genes that increase your risk (genetics).  You are sexually active.  You take antibiotic medicines.  You have a condition that causes your flow of urine to slow down, such as: ? An enlarged prostate, if you are female. ? Blockage in your urethra (stricture). ? A kidney stone. ? A nerve condition that affects your bladder control (neurogenic bladder). ? Not getting enough to drink, or not urinating often.  You have certain medical conditions, such as: ? Diabetes. ? A weak disease-fighting system (immunesystem). ? Sickle cell disease. ? Gout. ? Spinal cord injury. What are the signs or symptoms? Symptoms of this condition include:  Needing to urinate right away (urgently).  Frequent urination or passing small amounts of urine frequently.  Pain or burning with urination.  Blood in the urine.  Urine that smells bad or unusual.  Trouble urinating.  Cloudy  urine.  Vaginal discharge, if you are female.  Pain in the abdomen or the lower back. You may also have:  Vomiting or a decreased appetite.  Confusion.  Irritability or tiredness.  A fever.  Diarrhea. The first symptom in older adults may be confusion. In some cases, they may not have any symptoms until the infection has worsened. How is this diagnosed? This condition is diagnosed based on your medical history and a physical exam. You may also have other tests, including:  Urine tests.  Blood tests.  Tests for sexually transmitted infections (STIs). If you have had more than one UTI, a cystoscopy or imaging studies may be done to determine the cause of the infections. How is this treated? Treatment for this condition includes:  Antibiotic medicine.  Over-the-counter medicines to treat discomfort.  Drinking enough water to stay hydrated. If you have frequent infections or have other conditions such as a kidney stone, you may need to see a health care provider who specializes in the urinary tract (urologist). In rare cases, urinary tract infections can cause sepsis. Sepsis is a life-threatening condition that occurs when the body responds to an infection. Sepsis is treated in the hospital with IV antibiotics, fluids, and other medicines. Follow these instructions at home:  Medicines  Take over-the-counter and prescription medicines only as told by your health care provider.  If you were prescribed an antibiotic medicine, take it as told by your health care provider. Do not stop using the antibiotic even if you start to feel better. General instructions  Make sure you: ? Empty your bladder often and   completely. Do not hold urine for long periods of time. ? Empty your bladder after sex. ? Wipe from front to back after a bowel movement if you are female. Use each tissue one time when you wipe.  Drink enough fluid to keep your urine pale yellow.  Keep all follow-up  visits as told by your health care provider. This is important. Contact a health care provider if:  Your symptoms do not get better after 1-2 days.  Your symptoms go away and then return. Get help right away if you have:  Severe pain in your back or your lower abdomen.  A fever.  Nausea or vomiting. Summary  A urinary tract infection (UTI) is an infection of any part of the urinary tract, which includes the kidneys, ureters, bladder, and urethra.  Most urinary tract infections are caused by bacteria in your genital area, around the entrance to your urinary tract (urethra).  Treatment for this condition often includes antibiotic medicines.  If you were prescribed an antibiotic medicine, take it as told by your health care provider. Do not stop using the antibiotic even if you start to feel better.  Keep all follow-up visits as told by your health care provider. This is important. This information is not intended to replace advice given to you by your health care provider. Make sure you discuss any questions you have with your health care provider. Document Released: 04/28/2005 Document Revised: 01/26/2018 Document Reviewed: 01/26/2018 Elsevier Interactive Patient Education  2019 [place].  Urinary Tract Infection, Adult A urinary tract infection (UTI) is an infection of any part of the urinary tract. The urinary tract includes:  The kidneys.  The ureters.  The bladder.  The urethra. These organs make, store, and get rid of pee (urine) in the body. What are the causes? This is caused by germs (bacteria) in your genital area. These germs grow and cause swelling (inflammation) of your urinary tract. What increases the risk? You are more likely to develop this condition if:  You have a small, thin tube (catheter) to drain pee.  You cannot control when you pee or poop (incontinence).  You are female, and: ? You use these methods to prevent pregnancy: ? A medicine that  kills sperm (spermicide). ? A device that blocks sperm (diaphragm). ? You have low levels of a female hormone (estrogen). ? You are pregnant.  You have genes that add to your risk.  You are sexually active.  You take antibiotic medicines.  You have trouble peeing because of: ? A prostate that is bigger than normal, if you are female. ? A blockage in the part of your body that drains pee from the bladder (urethra). ? A kidney stone. ? A nerve condition that affects your bladder (neurogenic bladder). ? Not getting enough to drink. ? Not peeing often enough.  You have other conditions, such as: ? Diabetes. ? A weak disease-fighting system (immune system). ? Sickle cell disease. ? Gout. ? Injury of the spine. What are the signs or symptoms? Symptoms of this condition include:  Needing to pee right away (urgently).  Peeing often.  Peeing small amounts often.  Pain or burning when peeing.  Blood in the pee.  Pee that smells bad or not like normal.  Trouble peeing.  Pee that is cloudy.  Fluid coming from the vagina, if you are female.  Pain in the belly or lower back. Other symptoms include:  Throwing up (vomiting).  No urge to eat.  Feeling  mixed up (confused).  Being tired and grouchy (irritable).  A fever.  Watery poop (diarrhea). How is this treated? This condition may be treated with:  Antibiotic medicine.  Other medicines.  Drinking enough water. Follow these instructions at home:  Medicines  Take over-the-counter and prescription medicines only as told by your doctor.  If you were prescribed an antibiotic medicine, take it as told by your doctor. Do not stop taking it even if you start to feel better. General instructions  Make sure you: ? Pee until your bladder is empty. ? Do not hold pee for a long time. ? Empty your bladder after sex. ? Wipe from front to back after pooping if you are a female. Use each tissue one time when you  wipe.  Drink enough fluid to keep your pee pale yellow.  Keep all follow-up visits as told by your doctor. This is important. Contact a doctor if:  You do not get better after 1-2 days.  Your symptoms go away and then come back. Get help right away if:  You have very bad back pain.  You have very bad pain in your lower belly.  You have a fever.  You are sick to your stomach (nauseous).  You are throwing up. Summary  A urinary tract infection (UTI) is an infection of any part of the urinary tract.  This condition is caused by germs in your genital area.  There are many risk factors for a UTI. These include having a small, thin tube to drain pee and not being able to control when you pee or poop.  Treatment includes antibiotic medicines for germs.  Drink enough fluid to keep your pee pale yellow. This information is not intended to replace advice given to you by your health care provider. Make sure you discuss any questions you have with your health care provider. Document Released: 01/05/2008 Document Revised: 01/26/2018 Document Reviewed: 01/26/2018 Elsevier Interactive Patient Education  2019 [employer].

## 2018-08-23 DIAGNOSIS — T82594D Other mechanical complication of infusion catheter, subsequent encounter: Secondary | ICD-10-CM | POA: Diagnosis not present

## 2018-08-23 DIAGNOSIS — T83592A Infection and inflammatory reaction due to indwelling ureteral stent, initial encounter: Secondary | ICD-10-CM | POA: Diagnosis not present

## 2018-08-23 LAB — URINE CULTURE
MICRO NUMBER: 83155
SPECIMEN QUALITY: ADEQUATE

## 2018-08-24 DIAGNOSIS — E861 Hypovolemia: Secondary | ICD-10-CM | POA: Diagnosis not present

## 2018-08-24 DIAGNOSIS — N183 Chronic kidney disease, stage 3 (moderate): Secondary | ICD-10-CM | POA: Diagnosis not present

## 2018-08-24 DIAGNOSIS — N390 Urinary tract infection, site not specified: Secondary | ICD-10-CM | POA: Diagnosis not present

## 2018-08-24 IMAGING — US IR NEPHROSTOGRAM INI PLACEMENT LEFT
1 series · 2 of 2 positions shown · non-contrast
Comparison: CT 08/31/2017

INDICATION: 62-year-old female with a history bilateral ureteral obstruction.

EXAM:
IR NEPHROSTOGRAM LESA PLACEMENT RIGHT; IR NEPHROSTOGRAM LESA PLACEMENT
LEFT

[Series 1: ir (id) (id)/(id)/(id) ir · 2 of 2 slices shown]
[im 1/2]
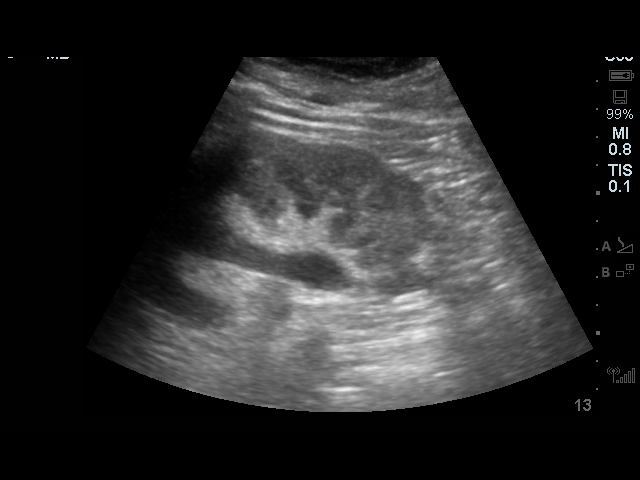
[im 2/2]
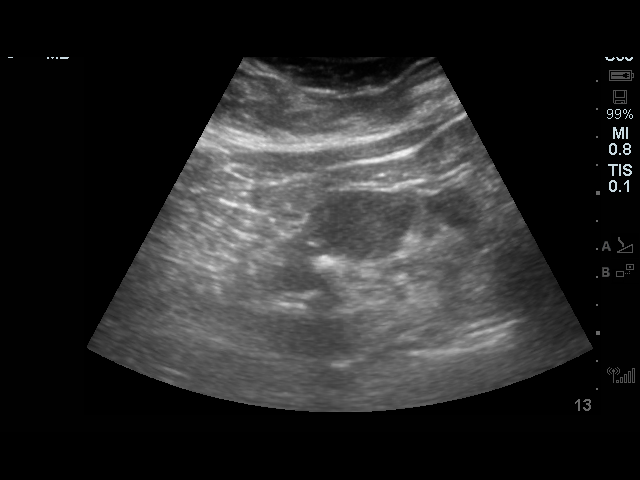

[2 of 2 positions shown; findings below may reference images not displayed]

MEDICATIONS:
Ciprofloxacin; The antibiotic was administered in an appropriate
time frame prior to skin puncture.

ANESTHESIA/SEDATION:
Fentanyl 100 mcg IV; Versed 1.0 mg IV

Moderate Sedation Time:  27 minutes

The patient was continuously monitored during the procedure by the
interventional radiology nurse under my direct supervision.

CONTRAST:  20 cc-administered into the collecting system(s)

FLUOROSCOPY TIME:  Fluoroscopy Time: 1 minutes 30 seconds (32 mGy).

COMPLICATIONS:
None

PROCEDURE:
Informed written consent was obtained from the patient after a
thorough discussion of the procedural risks, benefits and
alternatives. All questions were addressed. Maximal Sterile Barrier
Technique was utilized including caps, mask, sterile gowns, sterile
gloves, sterile drape, hand hygiene and skin antiseptic. A timeout
was performed prior to the initiation of the procedure.

Patient positioned prone position on the fluoroscopy table. The
patient was then prepped and draped in the usual sterile fashion. 1%
lidocaine was used to anesthetize the skin and subcutaneous tissues
for local anesthesia.

Left:

Ultrasound survey of the left flank was performed with images stored
and sent to PACs.

A Chiba needle was then used to access a posterior inferior calyx
with ultrasound guidance. With spontaneous urine returned through
the needle, passage of an 018 micro wire into the collecting system
was performed under fluoroscopy.

A small incision was made with an 11 blade scalpel, and the needle
was removed from the wire.

An Accustick system was then advanced over the wire into the
collecting system under fluoroscopy. The metal stiffener and inner
dilator were removed, and then a sample of fluid was aspirated
through the 4 French outer sheath.

Bentson wire was passed into the collecting system and the sheath
removed. Ten French dilation of the soft tissues was performed.

Using modified Seldinger technique, a 10 French pigtail catheter
drain was placed over the Bentson wire.

Wire and inner stiffener removed, and the pigtail was formed in the
collecting system.

Small amount of contrast confirmed position of the catheter.

Right:

Ultrasound survey of the right flank was performed with images
stored and sent to PACs.

A Chiba needle was then used to access a posterior inferior calyx
with ultrasound guidance. With spontaneous urine returned through
the needle, passage of an 018 micro wire into the collecting system
was performed under fluoroscopy.

A small incision was made with an 11 blade scalpel, and the needle
was removed from the wire.

An Accustick system was then advanced over the wire into the
collecting system under fluoroscopy. The metal stiffener and inner
dilator were removed, and then a sample of fluid was aspirated
through the 4 French outer sheath.

Bentson wire was passed into the collecting system and the sheath
removed. Ten French dilation of the soft tissues was performed.

Using modified Seldinger technique, a 10 French pigtail catheter
drain was placed over the Bentson wire.

Wire and inner stiffener removed, and the pigtail was formed in the
collecting system.

Small amount of contrast confirmed position of the catheter.

Patient tolerated the procedure well and remained hemodynamically
stable throughout.

No complications were encountered and no significant blood loss
encountered
IMPRESSION: Status post bilateral percutaneous nephrostomy.

## 2018-08-25 DIAGNOSIS — L89319 Pressure ulcer of right buttock, unspecified stage: Secondary | ICD-10-CM | POA: Diagnosis not present

## 2018-08-25 DIAGNOSIS — Z933 Colostomy status: Secondary | ICD-10-CM | POA: Diagnosis not present

## 2018-08-30 DIAGNOSIS — T83592A Infection and inflammatory reaction due to indwelling ureteral stent, initial encounter: Secondary | ICD-10-CM | POA: Diagnosis not present

## 2018-08-30 DIAGNOSIS — E860 Dehydration: Secondary | ICD-10-CM | POA: Diagnosis not present

## 2018-08-30 DIAGNOSIS — E785 Hyperlipidemia, unspecified: Secondary | ICD-10-CM | POA: Diagnosis not present

## 2018-08-30 DIAGNOSIS — L89309 Pressure ulcer of unspecified buttock, unspecified stage: Secondary | ICD-10-CM | POA: Diagnosis not present

## 2018-08-30 DIAGNOSIS — T82594D Other mechanical complication of infusion catheter, subsequent encounter: Secondary | ICD-10-CM | POA: Diagnosis not present

## 2018-09-01 DIAGNOSIS — L89309 Pressure ulcer of unspecified buttock, unspecified stage: Secondary | ICD-10-CM | POA: Diagnosis not present

## 2018-09-01 DIAGNOSIS — E785 Hyperlipidemia, unspecified: Secondary | ICD-10-CM | POA: Diagnosis not present

## 2018-09-01 DIAGNOSIS — E860 Dehydration: Secondary | ICD-10-CM | POA: Diagnosis not present

## 2018-09-01 IMAGING — DX DG SACRUM/COCCYX 2+V
3 series · 3 of 3 positions shown · non-contrast
Comparison: CT abdomen and pelvis 08/31/2017

CLINICAL DATA: Pain aggravated by sitting, fell in bathroom 3 days
ago, generalized RIGHT hip pain, midline lower back pain, and bottom
pain

EXAM:
SACRUM AND COCCYX - 2+ VIEW

[coccyx ap]
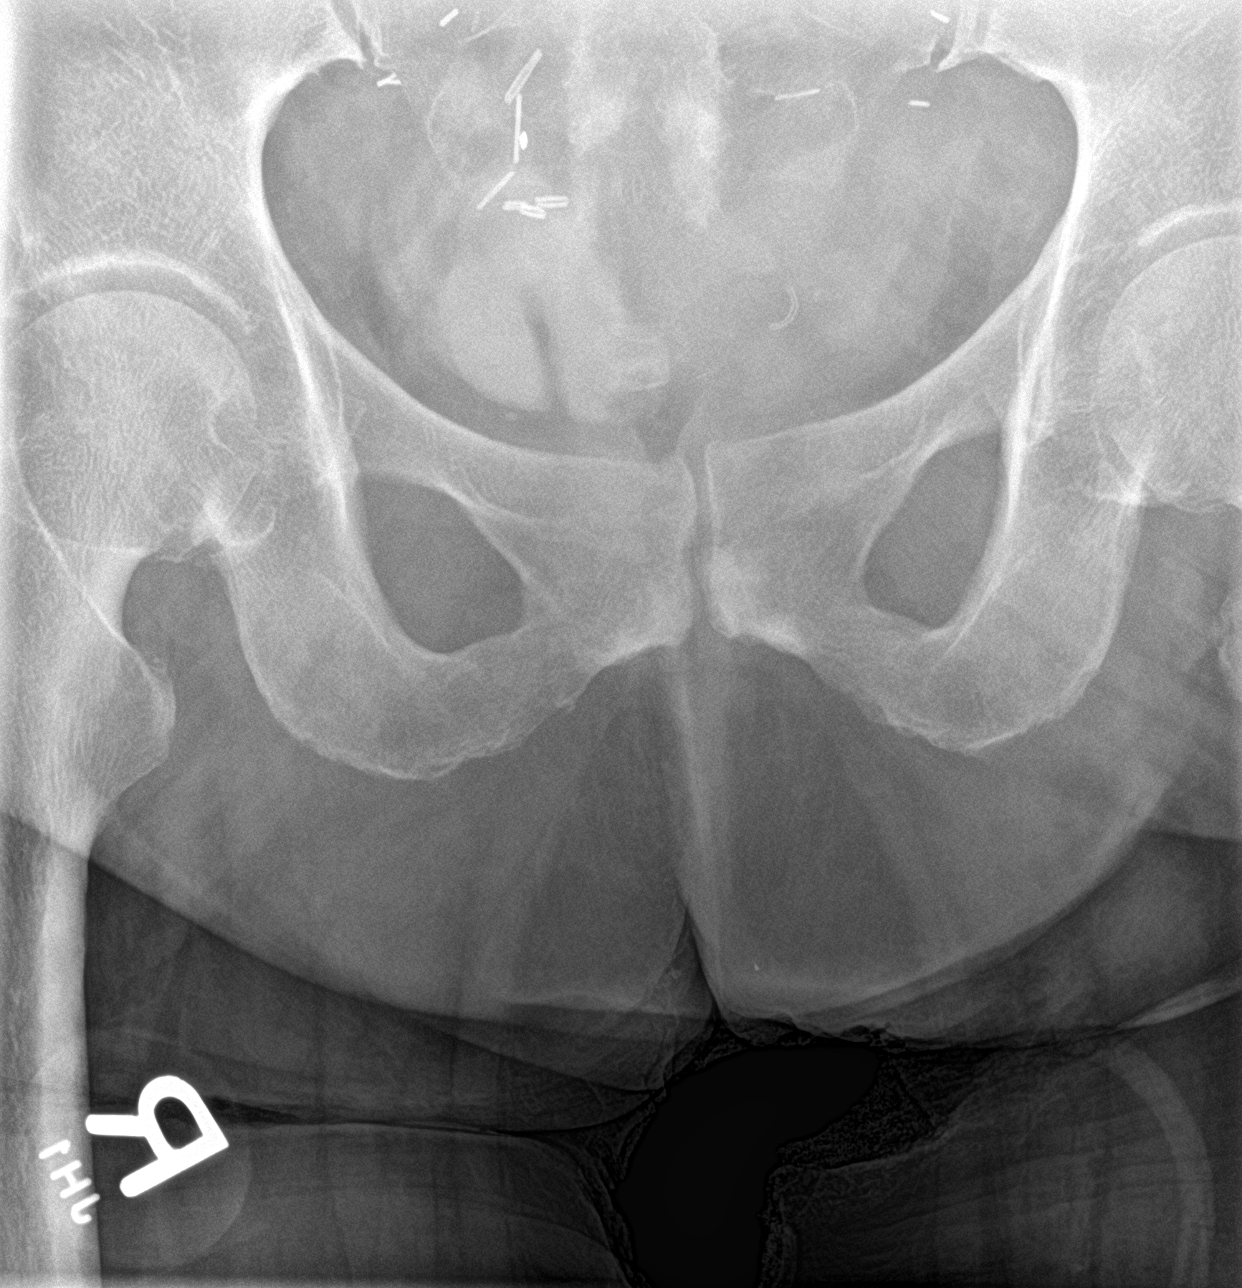

[sacrum ap]
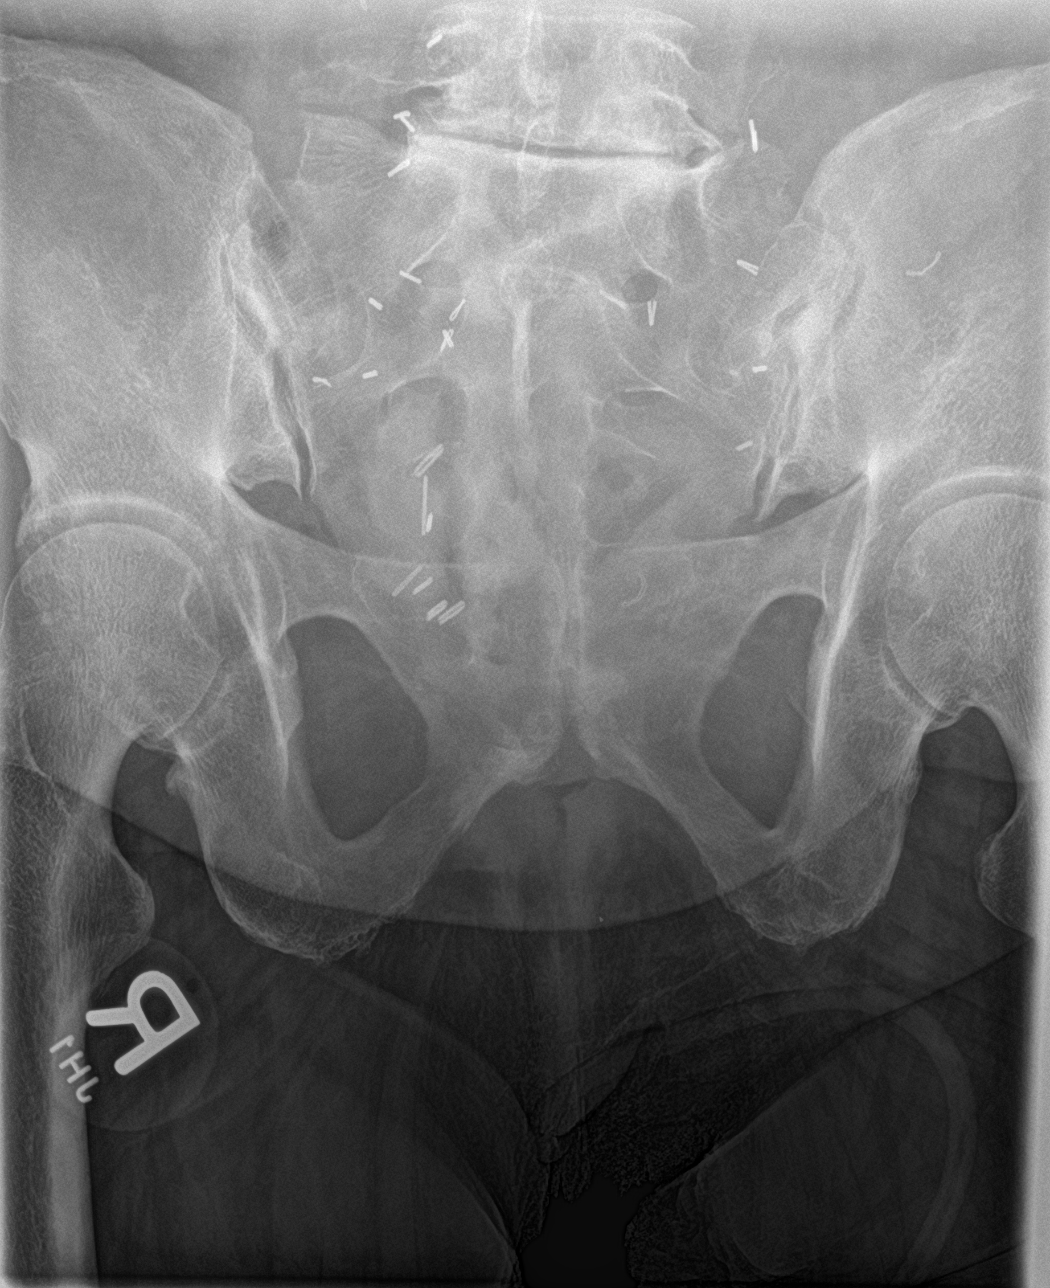

[sacrum lat]
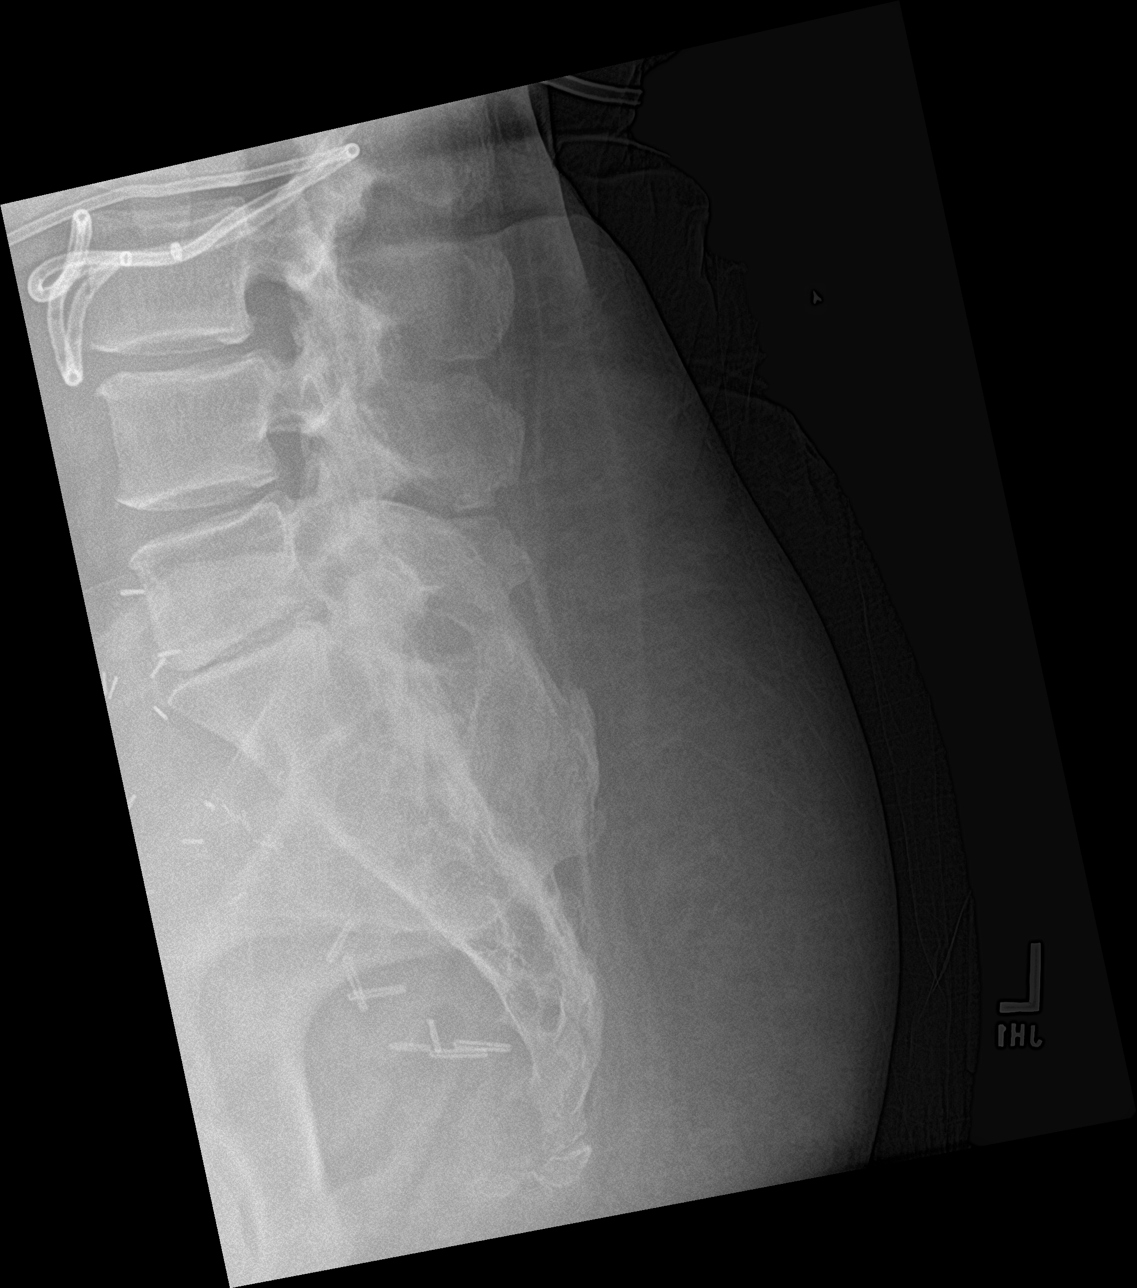

[3 of 3 positions shown; findings below may reference images not displayed]

FINDINGS: Minimal osteitis pubis.

SI joints and sacral foramina symmetric.

No sacrococcygeal fractures identified.

Degenerative disc disease changes at L5-S1 with disc space
narrowing, minimal endplate spur formation and endplate sclerosis..
IMPRESSION: No acute sacrococcygeal abnormalities.

Degenerative disc disease changes at L5-S1.

## 2018-09-01 IMAGING — DX DG LUMBAR SPINE COMPLETE 4+V
5 series · 5 of 5 positions shown · non-contrast
Comparison: CT abdomen and pelvis 08/31/2017

CLINICAL DATA: Pain aggravated by sitting, fell in bathroom 3 days
ago, generalized RIGHT hip pain, midline lower back pain, and bottom
pain

EXAM:
LUMBAR SPINE - COMPLETE 4+ VIEW

[l-spine ap]
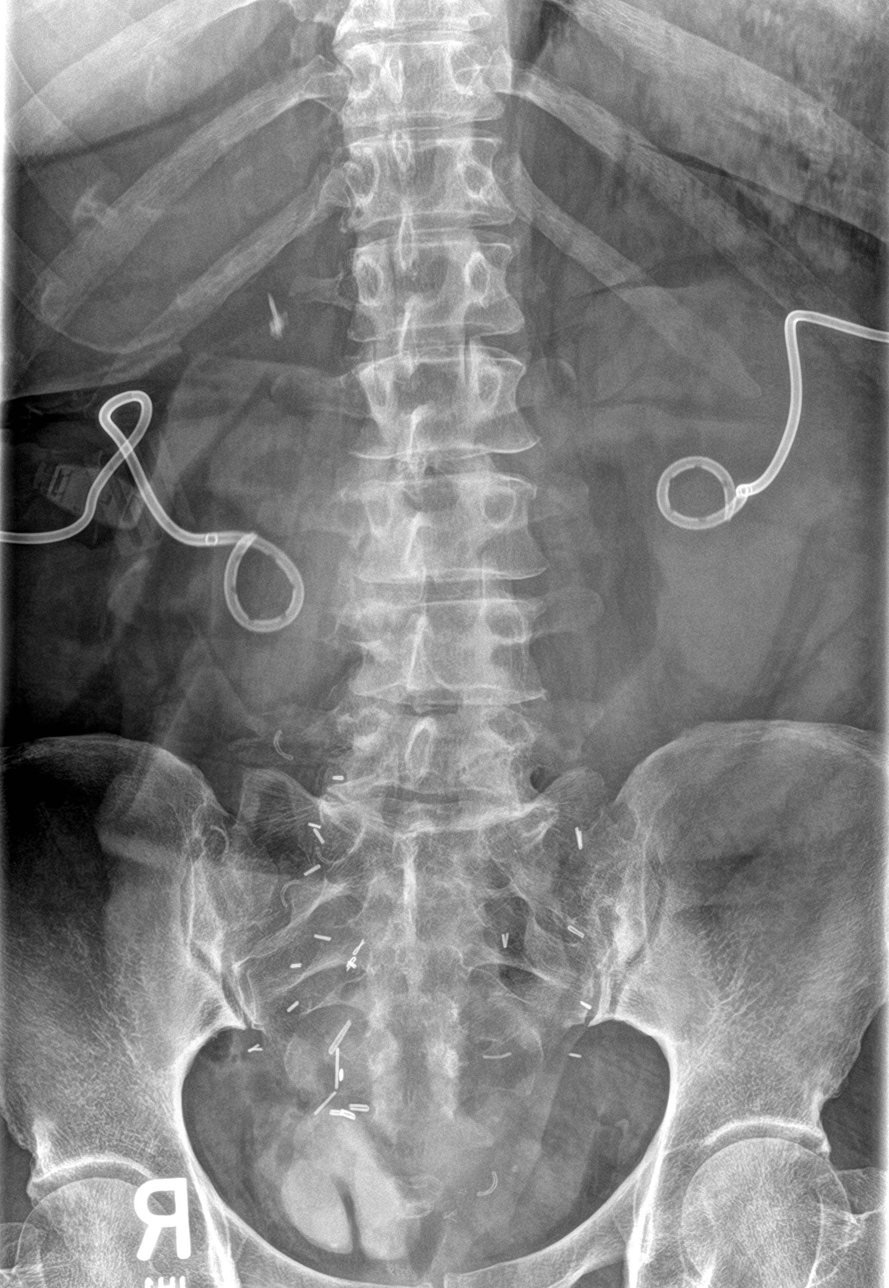

[l-spine obl (1 of 2)]
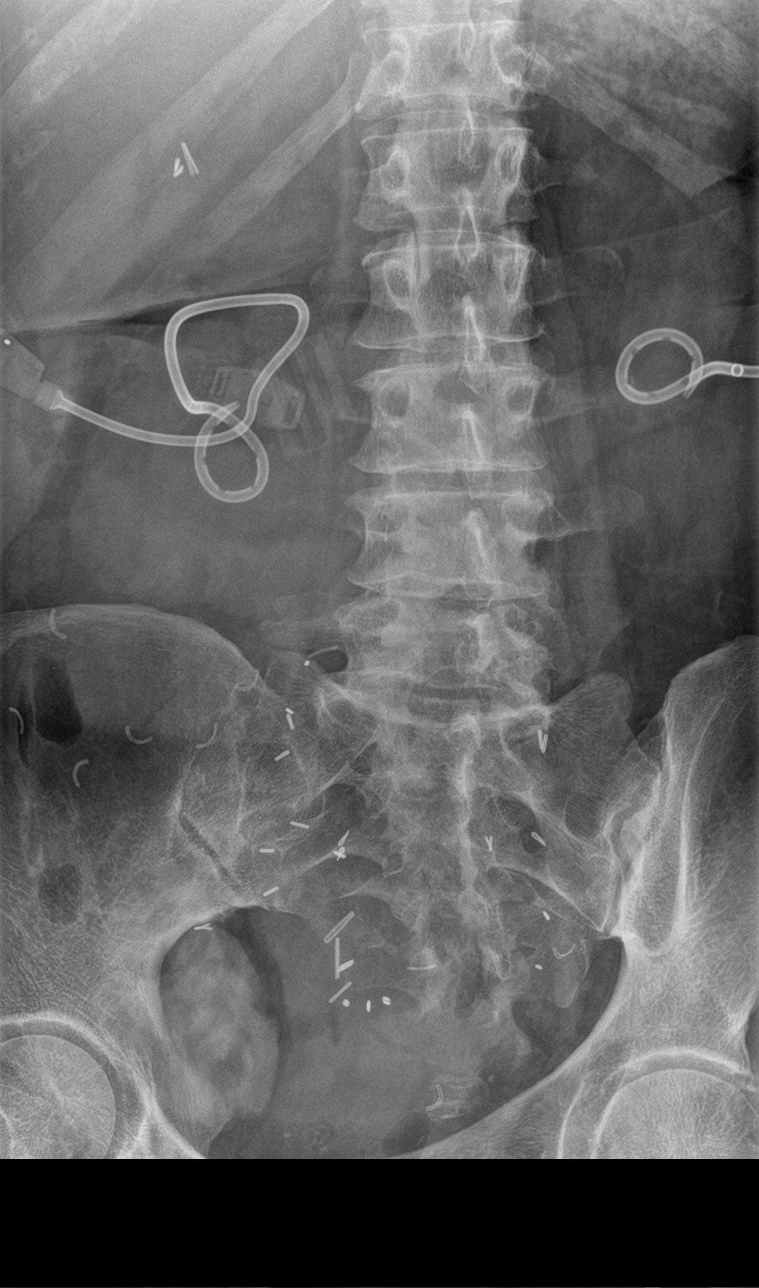

[l-spine obl (2 of 2)]
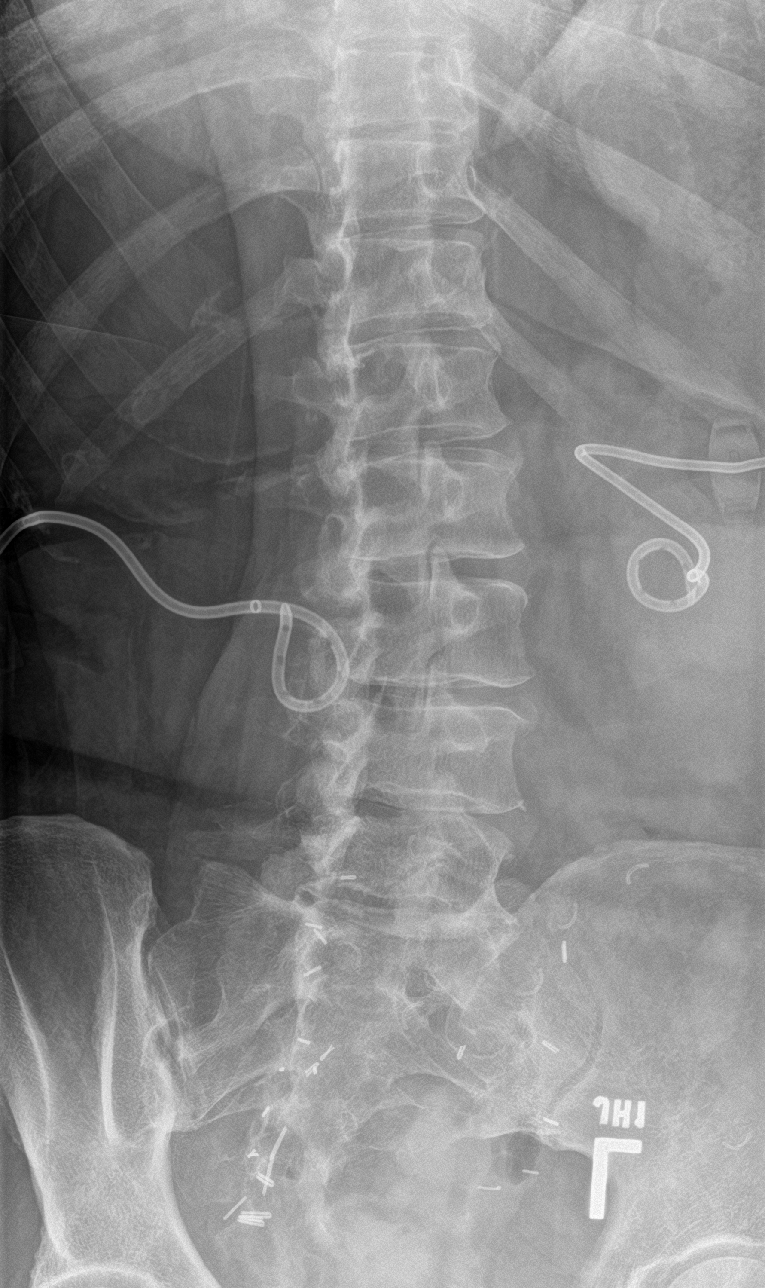

[l-spine lat]
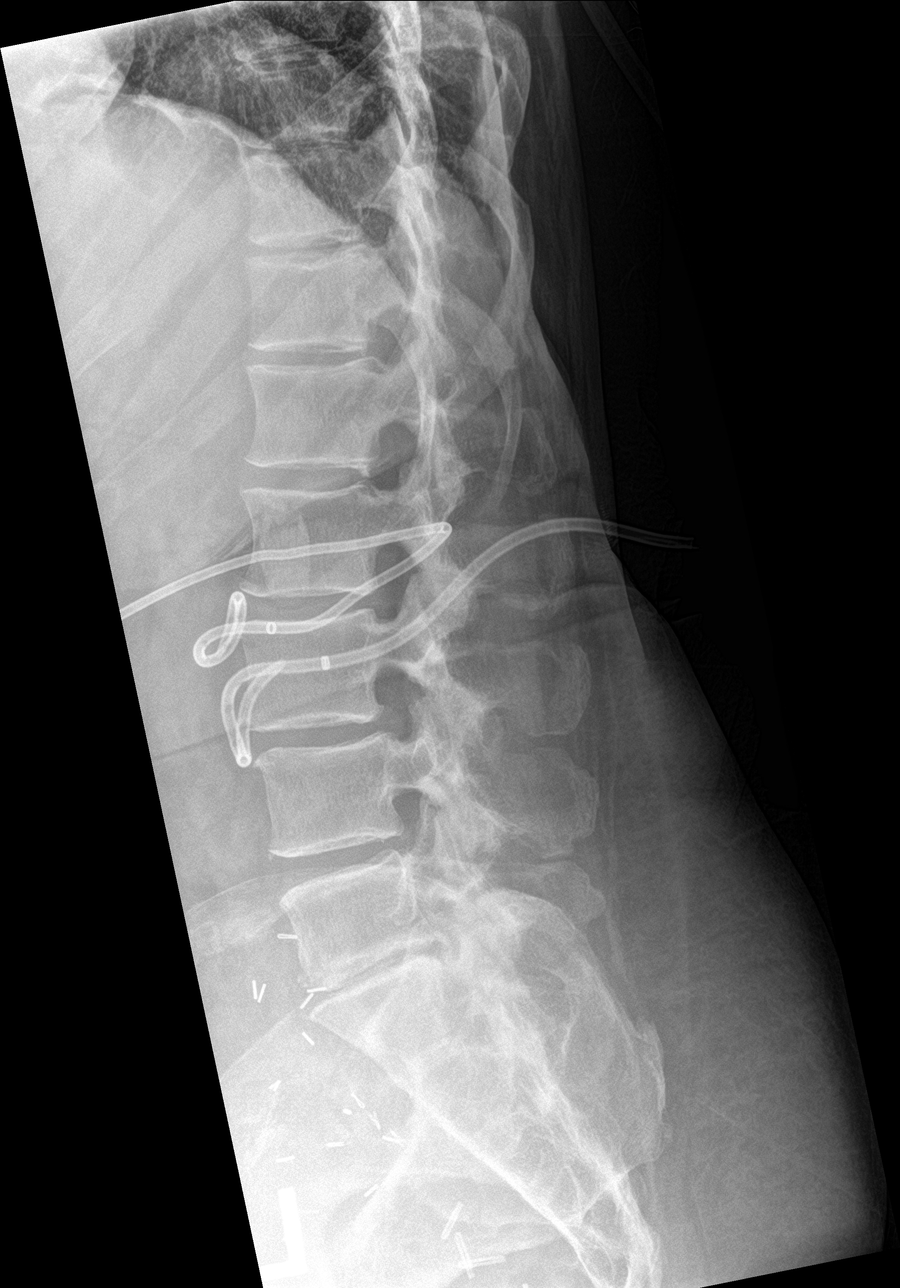

[l-spine spot]
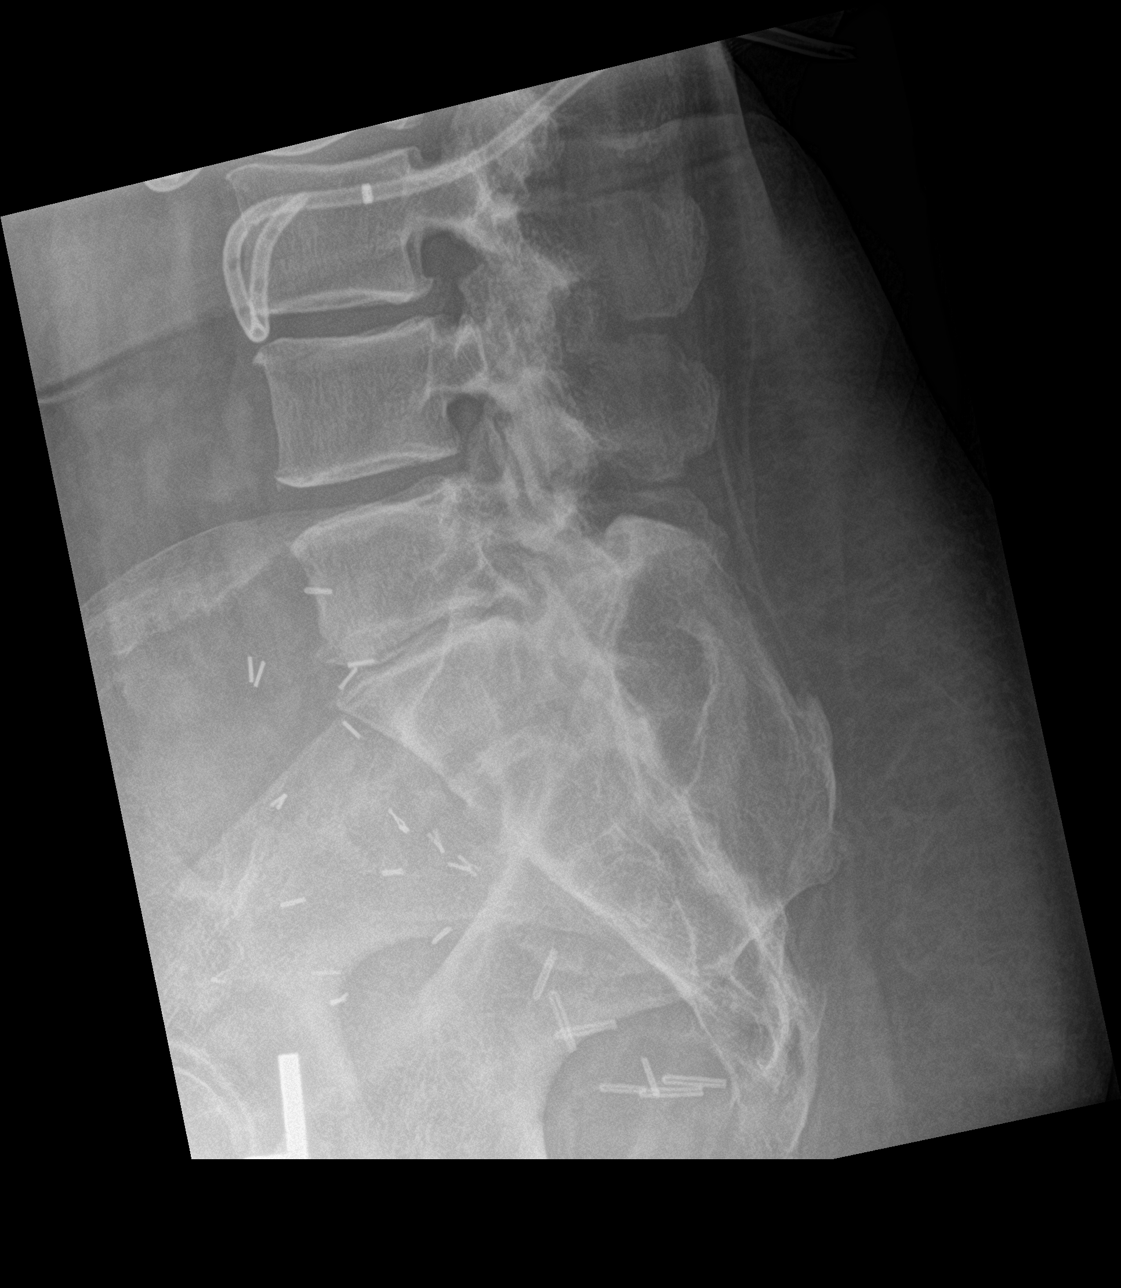

[5 of 5 positions shown; findings below may reference images not displayed]

FINDINGS: Osseous demineralization.

Five non-rib-bearing lumbar vertebra.

Multilevel disc space narrowing.

Vertebral body heights maintained.

No acute fracture, subluxation, or bone destruction.

SI joints preserved.

BILATERAL nephrostomy tubes.

Numerous pelvic surgical clips as well as RIGHT upper quadrant
surgical clips.
IMPRESSION: Osseous demineralization with mild degenerative disc disease changes
of the lumbar spine.

No acute abnormalities.

## 2018-09-04 IMAGING — XA IR NEPHROSTOGRAM EXISTING ACCESS RIGHT
3 series · 9 of 9 positions shown · non-contrast
Comparison: [HOSPITAL] the time of nephrostomy tube placement on
09/02/2017

INDICATION: History of bilateral percutaneous nephrostomy tube placement on
09/02/2017 to divert urine due to fistula between the rectum and
bladder. After recent fall, the patient is complaining of
significant pain in the right flank region near the site of the
indwelling right nephrostomy tube.

EXAM:
RIGHT NEPHROSTOGRAM

[Series 2: fl - angio · 4 of 41 frames shown (1 of 2)]
[frame 7/41]
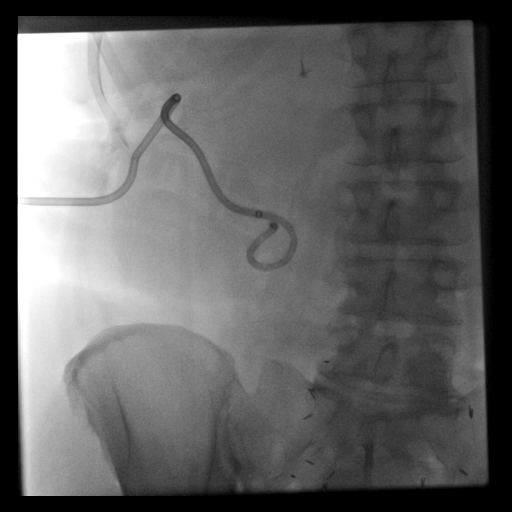
[frame 19/41]
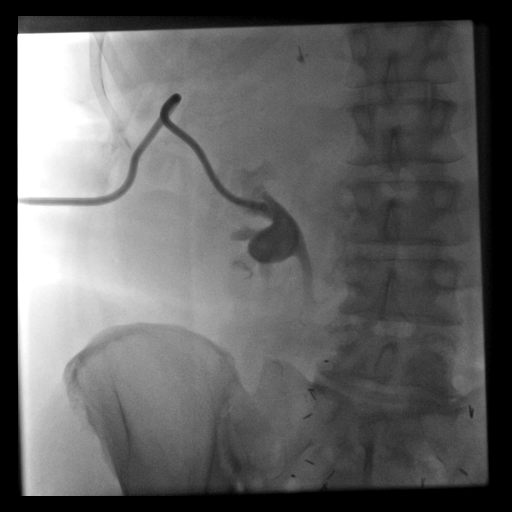
[frame 21/41]
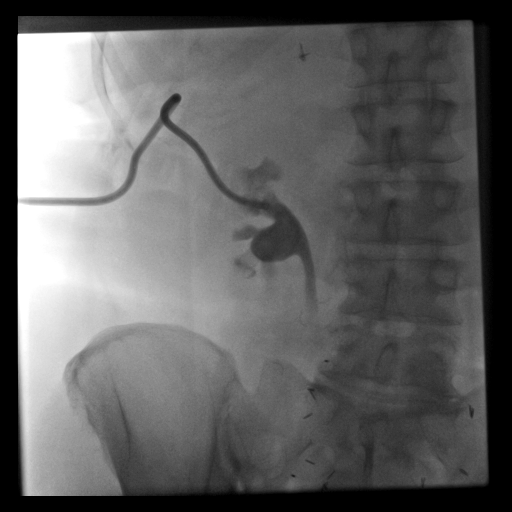
[frame 35/41]
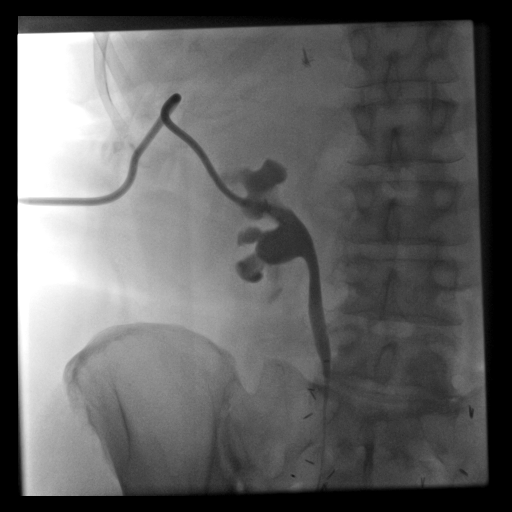

[Series 3: fl - angio · 4 of 22 frames shown (2 of 2)]
[frame 1/22]
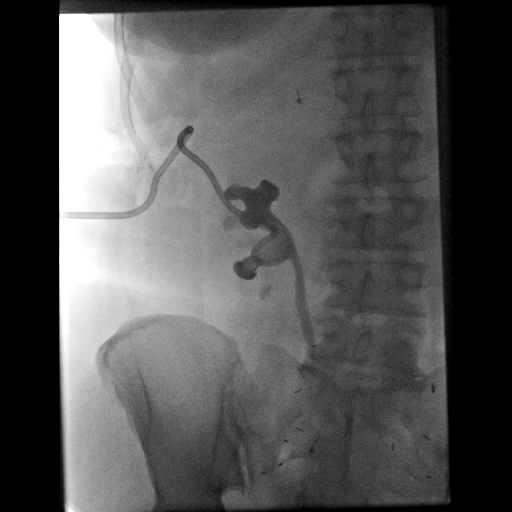
[frame 4/22]
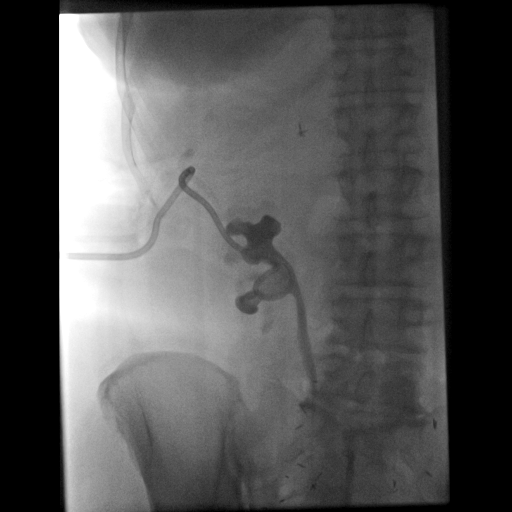
[frame 12/22]
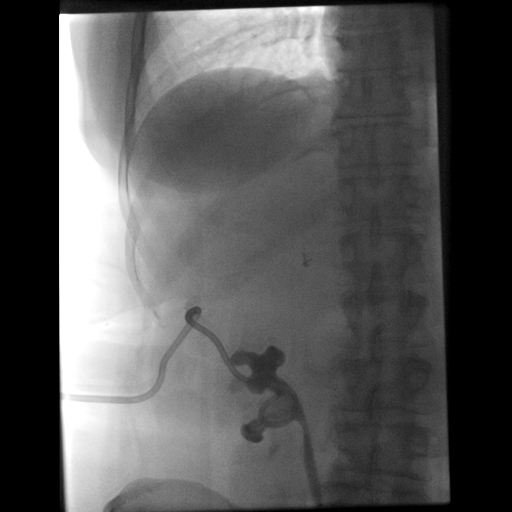
[frame 19/22]
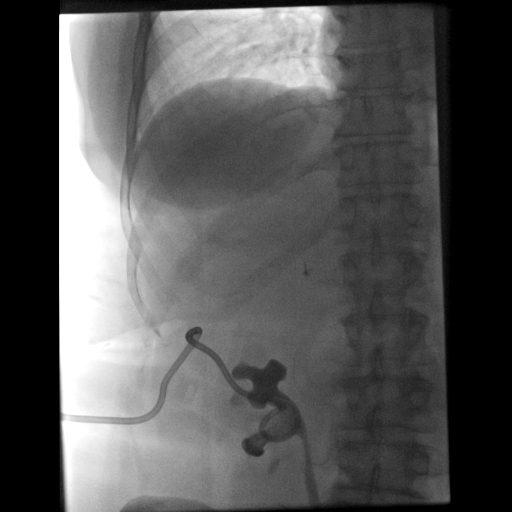

[Series 300: tube placements · 1 of 1 slices shown]
[im 1/1]
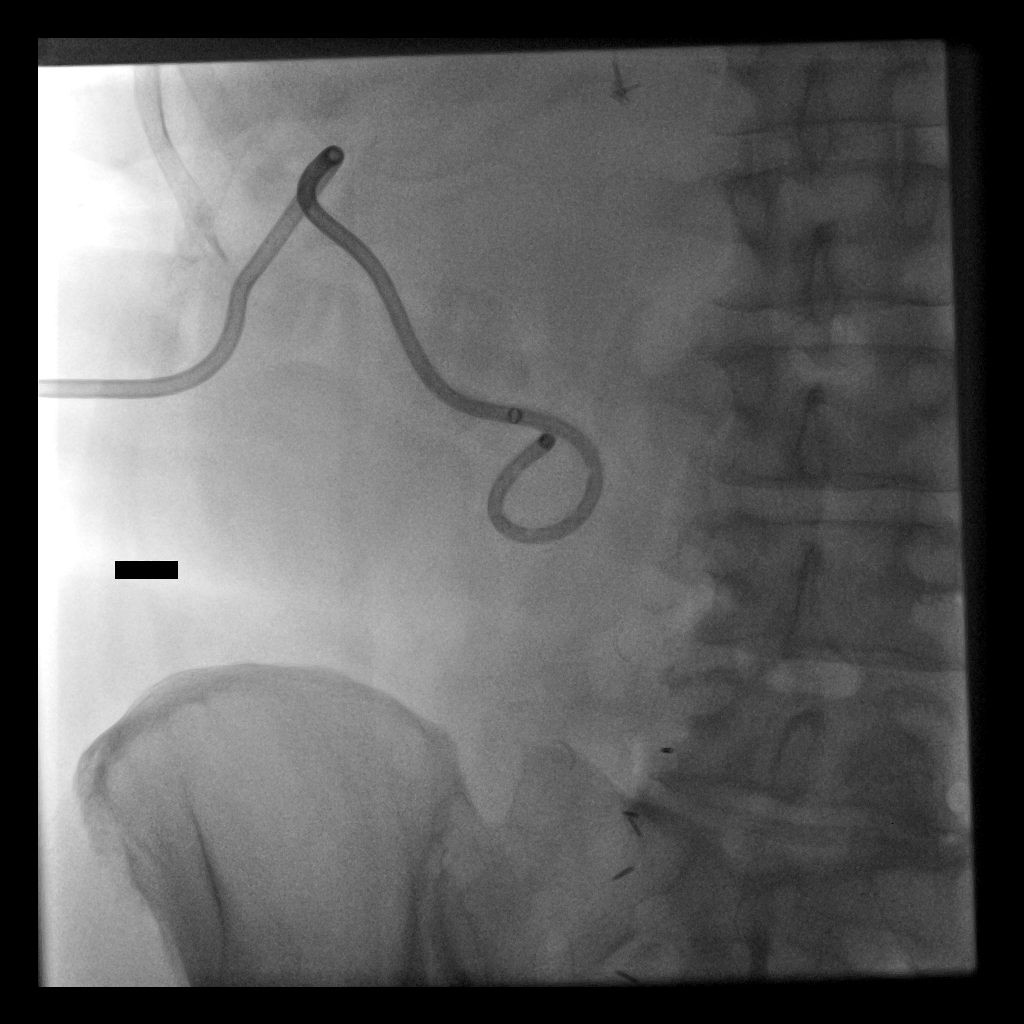

[9 of 9 positions shown; findings below may reference images not displayed]

MEDICATIONS:
None

ANESTHESIA/SEDATION:
None

CONTRAST:  5 mL [id number]-administered into the collecting
system(s)

FLUOROSCOPY TIME:  Fluoroscopy Time: 12 seconds.  3.8 mGy.

COMPLICATIONS:
None immediate.

PROCEDURE:
Under fluoroscopy, contrast material was injected via the
pre-existing right percutaneous nephrostomy tube. Fluoroscopic
images were saved as a cine loop. The catheter was flushed and
reattached to a gravity drainage bag.
FINDINGS: The percutaneous nephrostomy tube is intact without evidence of
catheter rupture or contrast extravasation. The catheter is formed
at the level of the renal pelvis and is well positioned. No
significant filling defects are identified in the renal collecting
system or opacified ureter.
IMPRESSION: Unremarkable right nephrostogram via pre-existing percutaneous
nephrostomy tube demonstrating intact nephrostomy tube which is
positioned in the right renal pelvis.

## 2018-09-05 DIAGNOSIS — E785 Hyperlipidemia, unspecified: Secondary | ICD-10-CM | POA: Diagnosis not present

## 2018-09-05 DIAGNOSIS — L89309 Pressure ulcer of unspecified buttock, unspecified stage: Secondary | ICD-10-CM | POA: Diagnosis not present

## 2018-09-05 DIAGNOSIS — E860 Dehydration: Secondary | ICD-10-CM | POA: Diagnosis not present

## 2018-09-06 DIAGNOSIS — T82594D Other mechanical complication of infusion catheter, subsequent encounter: Secondary | ICD-10-CM | POA: Diagnosis not present

## 2018-09-06 DIAGNOSIS — T83592A Infection and inflammatory reaction due to indwelling ureteral stent, initial encounter: Secondary | ICD-10-CM | POA: Diagnosis not present

## 2018-09-06 IMAGING — CT CT ABD-PELV W/O CM
2 of 4 series · 15 of 46 positions shown, 17 images · non-contrast
Comparison: CT abdomen pelvis-08/31/2017;

CLINICAL DATA: History of ovarian and uterine cancer complicated by
development of a rectovesical fistula requiring placement of
bilateral percutaneous nephrostomy catheters on 09/02/2017 for the
purposes of urinary diversion.

Unfortunately, the patient has experienced persistent right-sided
flank pain attributable to the right-sided nephrostomy catheter.
EXAM:
CT ABDOMEN AND PELVIS WITHOUT CONTRAST
TECHNIQUE: Multidetector CT imaging of the abdomen and pelvis was performed
following the standard protocol without IV contrast.

[Series 2: axial st · axial · 0.87mm/px · z∈[-734,-324]mm · 12 of 92 slices shown, 14 images]
[im 5/92  soft-tissue]
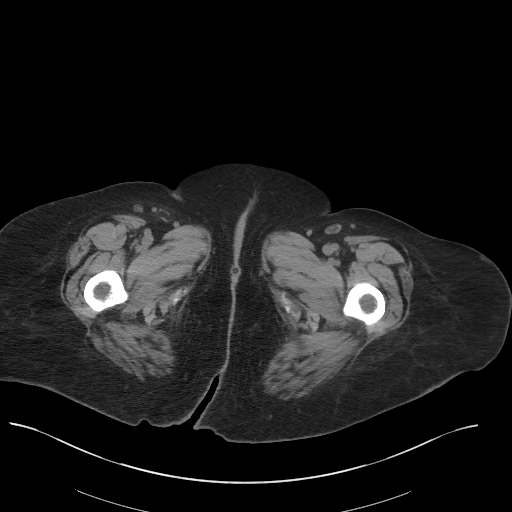
[im 5/92  bone]
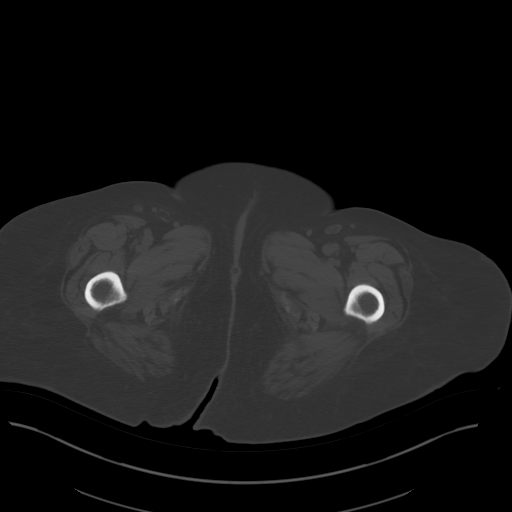
[im 15/92  soft-tissue]
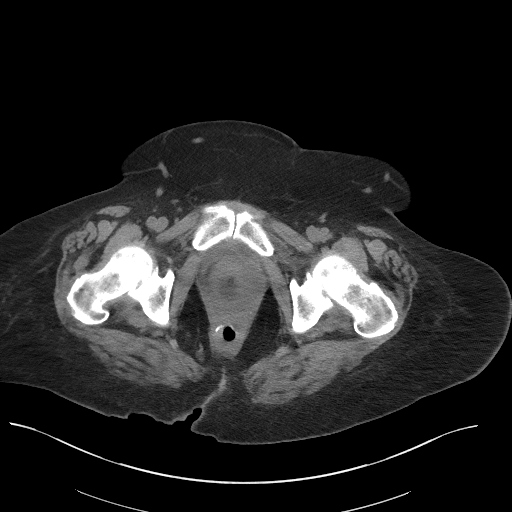
[im 20/92  soft-tissue]
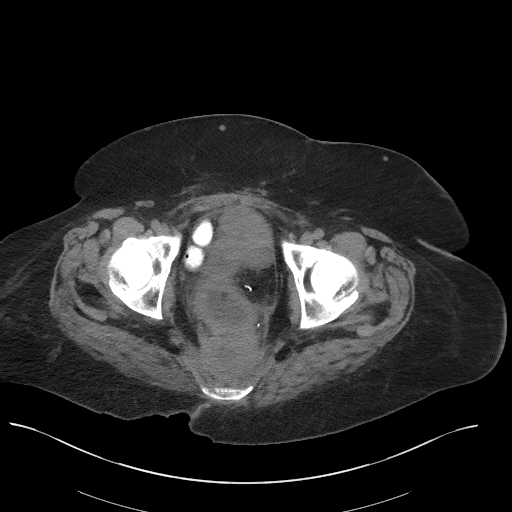
[im 29/92  soft-tissue]
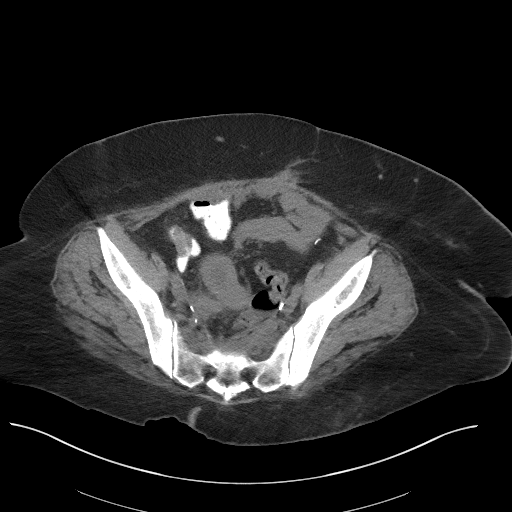
[im 34/92  soft-tissue]
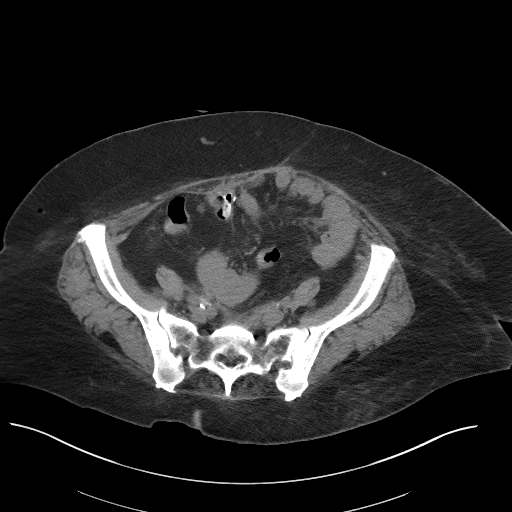
[im 44/92  soft-tissue]
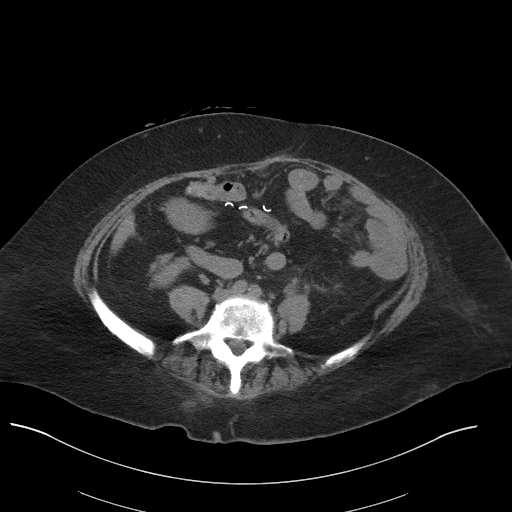
[im 48/92  soft-tissue]
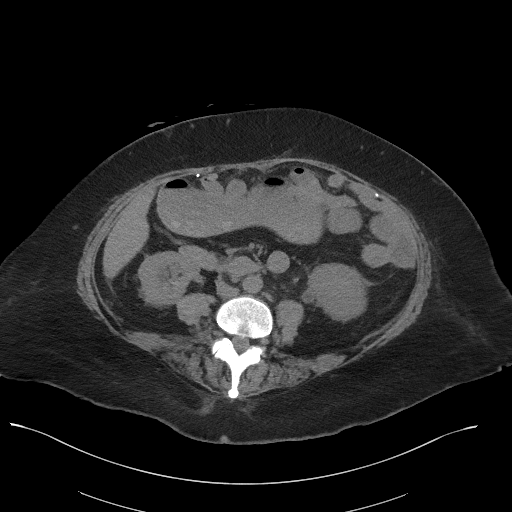
[im 58/92  soft-tissue]
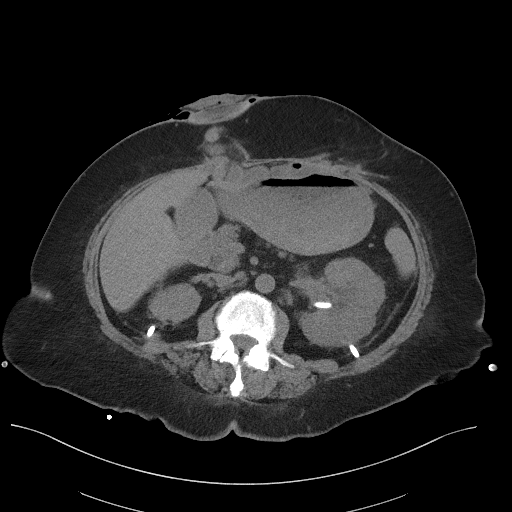
[im 63/92  soft-tissue]
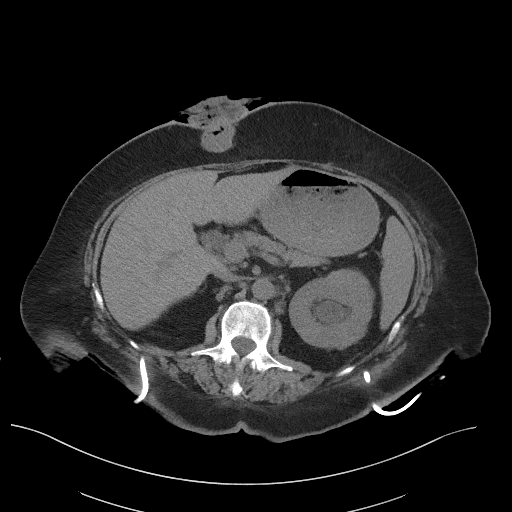
[im 63/92  bone]
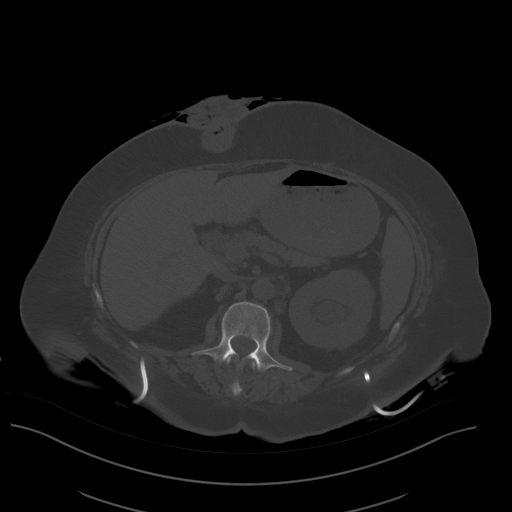
[im 72/92  soft-tissue]
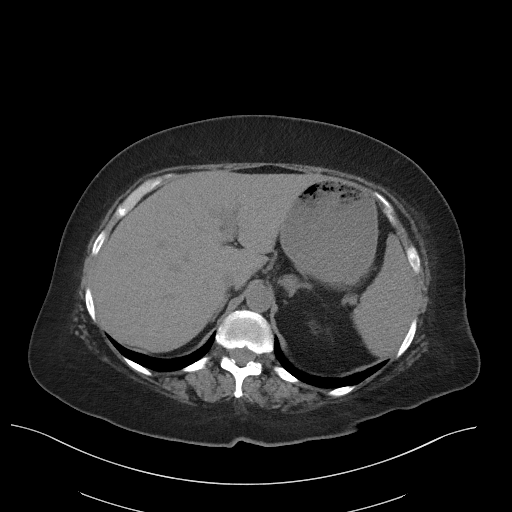
[im 77/92  soft-tissue]
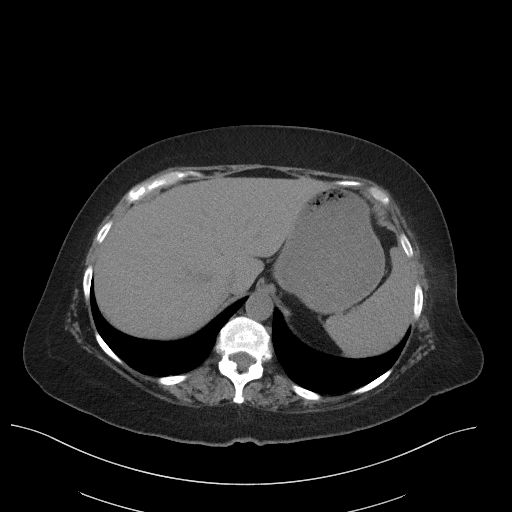
[im 87/92  soft-tissue]
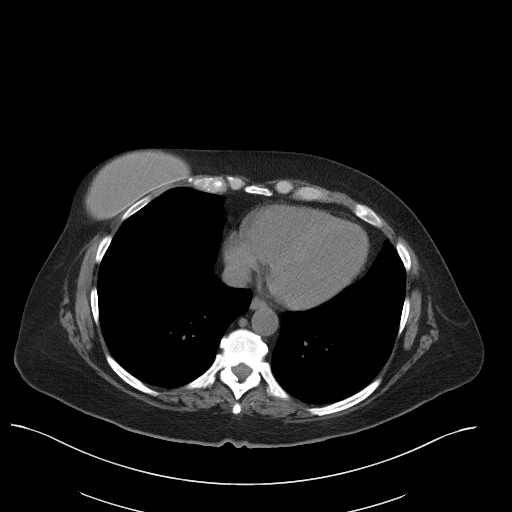

[Series 5: coronal st · coronal · 0.87mm/px · 3 of 108 slices shown]
[im 36/108  soft-tissue]
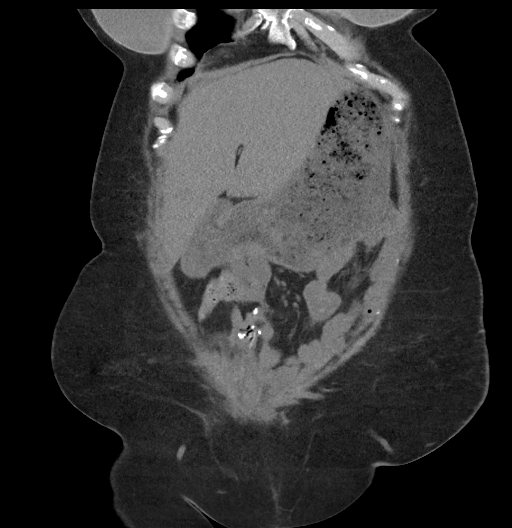
[im 48/108  soft-tissue]
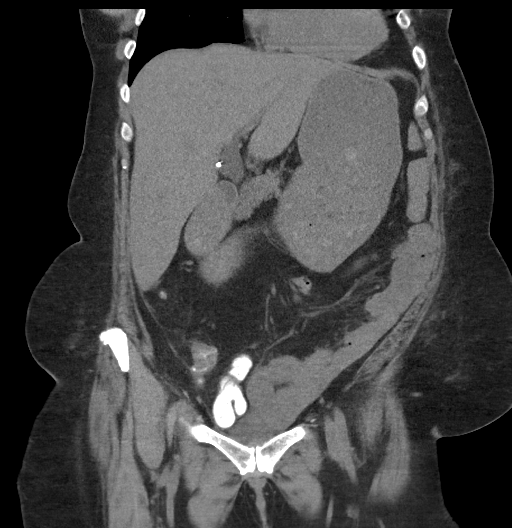
[im 60/108  soft-tissue]
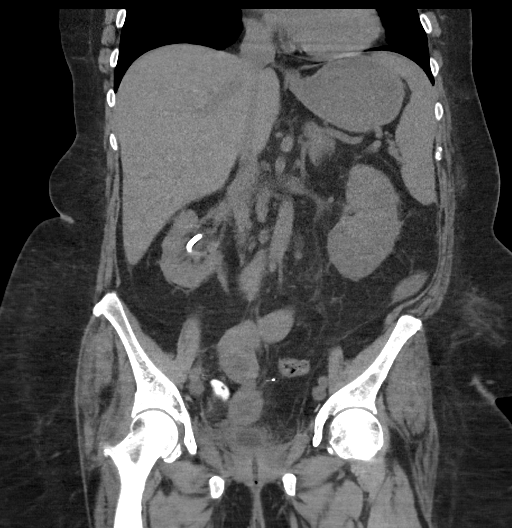

[15 of 46 positions shown; findings below may reference images not displayed]

02/09/2016; ultrasound
fluoroscopic guided bilateral percutaneous nephrostomy catheter
placement-09/02/2016; right-sided antegrade nephrostogram -
09/13/2017
FINDINGS: Lack of intravenous contrast limits the ability to evaluate solid
abdominal organs.

Lower chest: Limited visualization of the lower thorax demonstrates
minimal subsegmental atelectasis within the imaged bilateral lower
lobes, right greater than left. No discrete focal airspace
opacities. No pleural effusion.

Normal heart size.  No pericardial effusion.

Hepatobiliary: Normal hepatic contour. Post cholecystectomy.
Persistent dilatation of the common bile duct, measuring
approximately 1.3 cm (as measured in greatest oblique short axis
coronal diameter - image 51, series 5), presumably secondary to
biliary reservoir phenomenon. No radiopaque gallstones. No ascites.

Pancreas: Normal noncontrast appearance of the pancreas

Spleen: Normal noncontrast appearance of the spleen

Adrenals/Urinary Tract: Bilateral percutaneous nephrostomy catheters
are probe with the position with very mild residual left-sided
pelvicaliectasis. No significant right-sided pelvicaliectasis. Both
percutaneous nephrostomy catheters take a slightly serpiginous tract
however this is likely secondary to patient body habitus. No
definitive associated pericatheter hematoma. There is no definitive
involvement of either hemidiaphragm at both nephrostomies are below
the 12th rib.

Mid minimal amount of bilateral perinephric stranding, unchanged.

Previously characterized approximately 0.7 cm adenoma within left
adrenal gland is unchanged (image 22, series 2). Normal noncontrast
appearance of the right adrenal gland.

Urinary bladder is decompressed with Foley catheter.

Stomach/Bowel: Redemonstrated loop ileostomy within the right mid
hemiabdomen and the sequela of near complete colectomy and small
bowel resection.

There is a grossly unchanged approximately 4.7 x 3.7 cm fluid
collection within the presacral space (image 71, series 2) at the
location of previously noted contained perforation involving a
suspected loop of small bowel extends down to the midline of the
lower pelvis.

Mild distention of the stomach. No evidence of enteric obstruction.
No pneumoperitoneum, pneumatosis or portal venous gas.

Vascular/Lymphatic: Normal caliber the abdominal aorta.

No bulky retroperitoneal, mesenteric, pelvic or inguinal
lymphadenopathy on this noncontrast examination.

Reproductive: Post hysterectomy.  No discrete adnexal lesion.

Other: Diffuse body wall anasarca. Stranding within the left upper
abdominal quadrant at location of prior end colostomy. Post
bilateral breast augmentation.

Musculoskeletal: No acute or aggressive osseous abnormalities. No
definitive evidence of osteolysis though the presacral fluid
collection does abut the ventral aspect of the sacrum and coccyx.
IMPRESSION: 1. Appropriately positioned bilateral percutaneous nephrostomy
catheters without evidence complication.
2. Grossly unchanged approximately 4.7 cm fluid collection within
the presacral space at the location of previously noted contained
perforation involving a suspected loop of small bowel which extends
down to the midline of the lower pelvis. While there is no evidence
of osteolysis to suggest osteomyelitis, the presacral fluid
collection is noted to abut the ventral aspect of the sacrum coccyx.
Further evaluation with pelvic MRI could be performed as indicated.
3. Stable sequela of loop ileostomy and small bowel anastomosis
without evidence of enteric obstruction.

## 2018-09-07 IMAGING — CT CT IMAGE GUIDE DRAIN TRANSVAG TRANSRECT PERITONEAL RETROPER
1 of 7 series · 11 of 32 positions shown, 17 images · non-contrast
Comparison: none

INDICATION: 62-year-old female with a history of presacral abscess

[Series 2: i-spiral 5.0 b31f · axial · 0.94mm/px · z∈[-605,-458]mm · 11 of 52 slices shown, 17 images]
[im 5/52  soft-tissue]
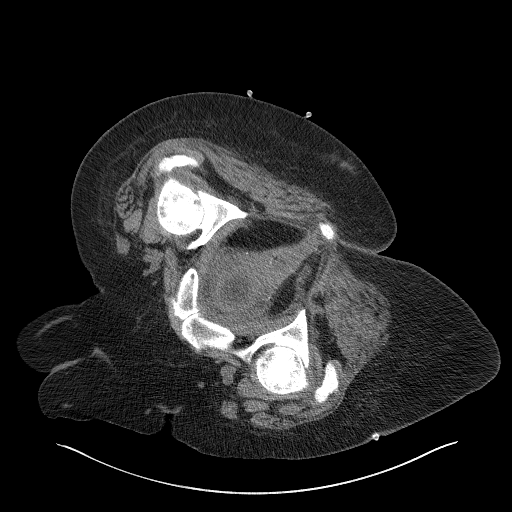
[im 5/52  bone]
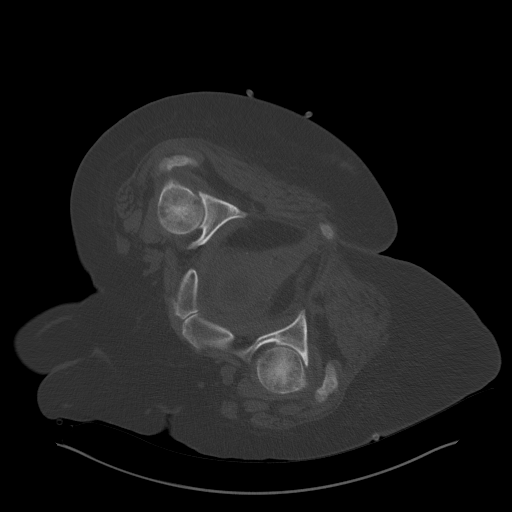
[im 9/52  soft-tissue]
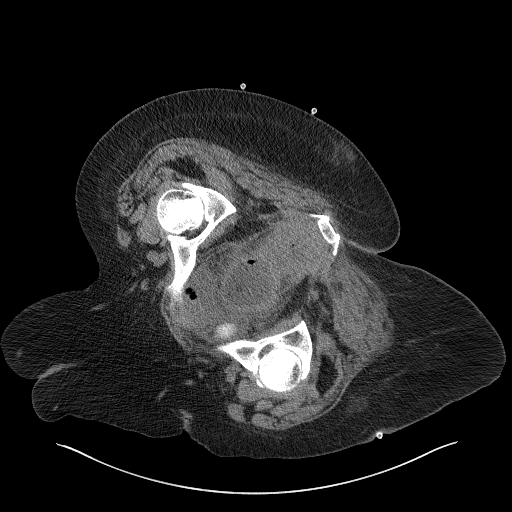
[im 13/52  soft-tissue]
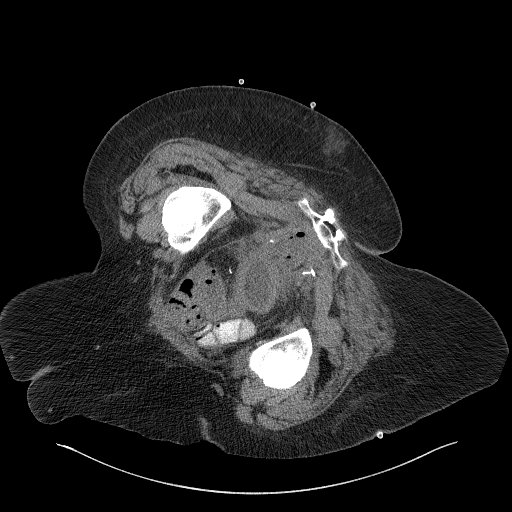
[im 18/52  soft-tissue]
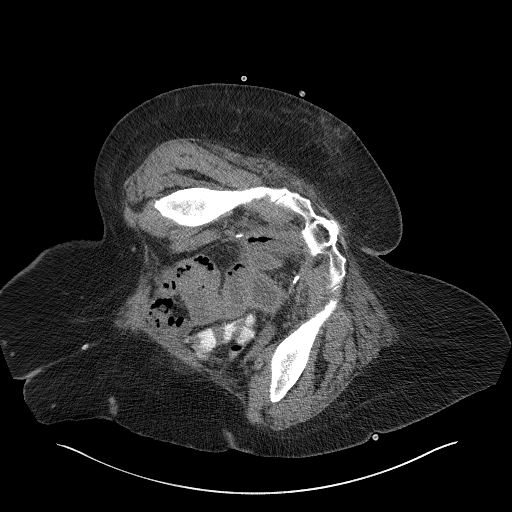
[im 22/52  soft-tissue]
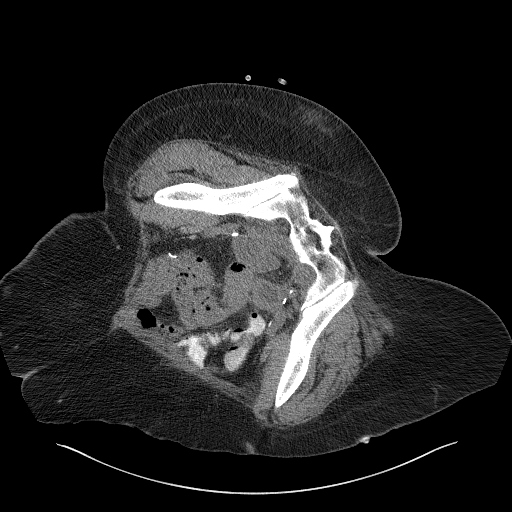
[im 26/52  soft-tissue]
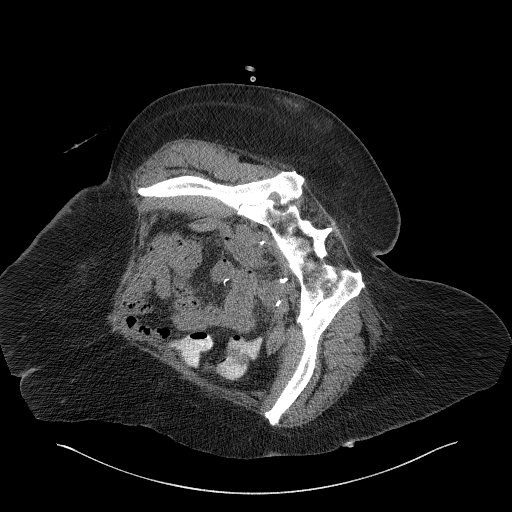
[im 30/52  soft-tissue]
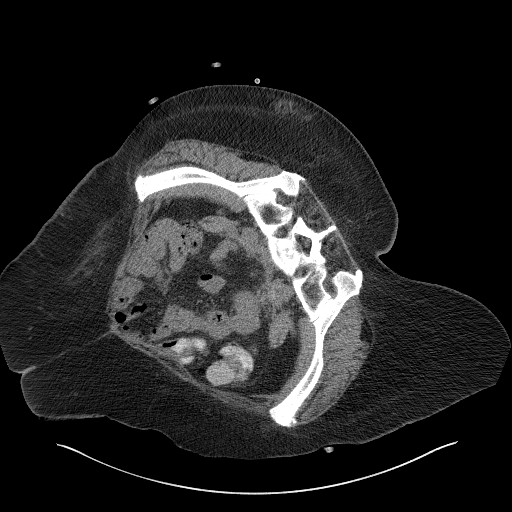
[im 35/52  soft-tissue]
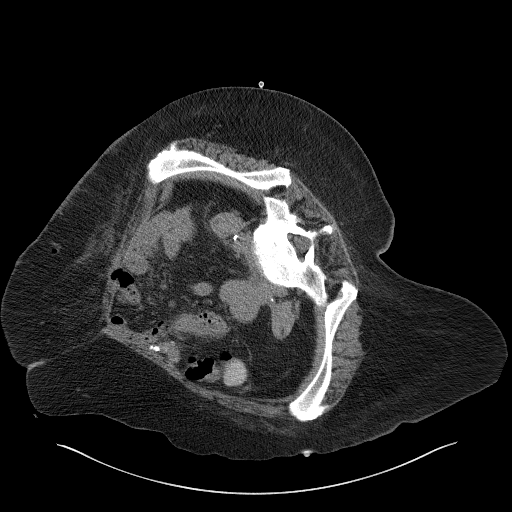
[im 35/52  lung]
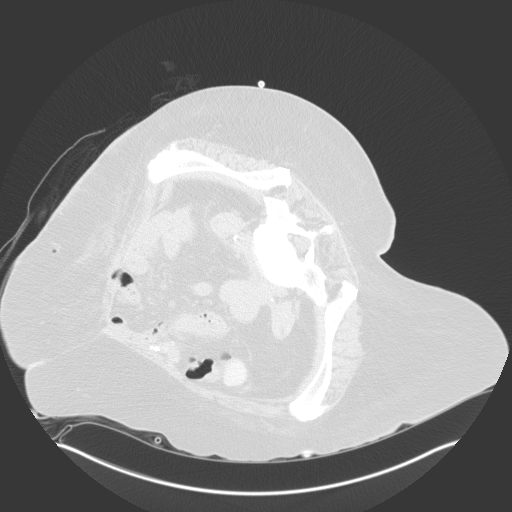
[im 39/52  soft-tissue]
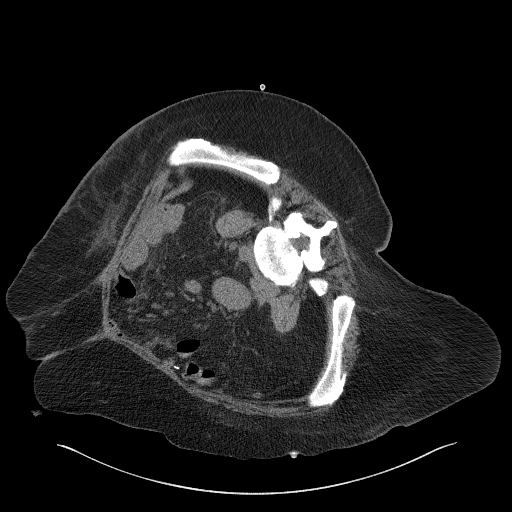
[im 39/52  lung]
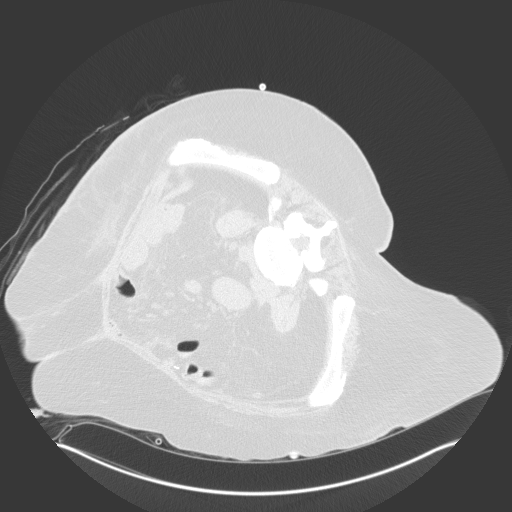
[im 39/52  bone]
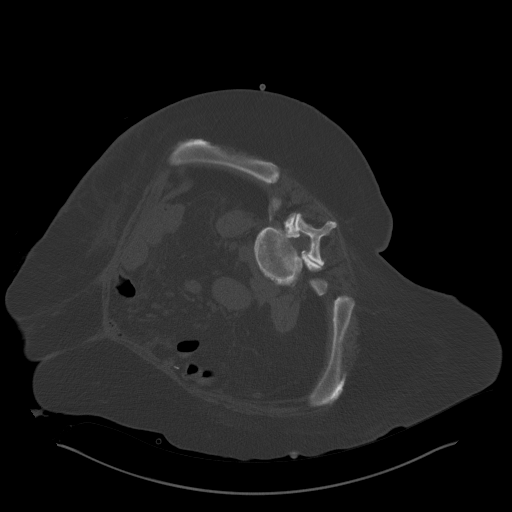
[im 43/52  soft-tissue]
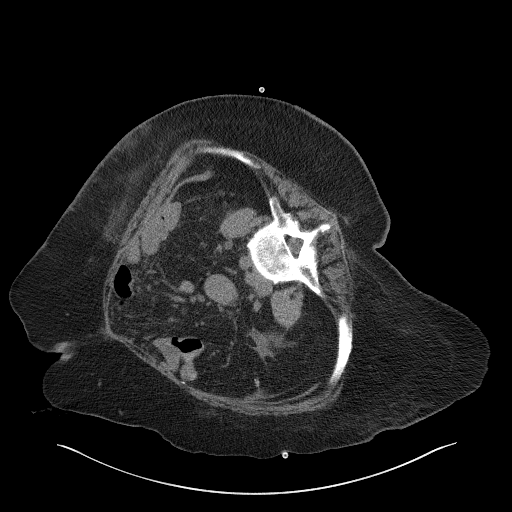
[im 43/52  lung]
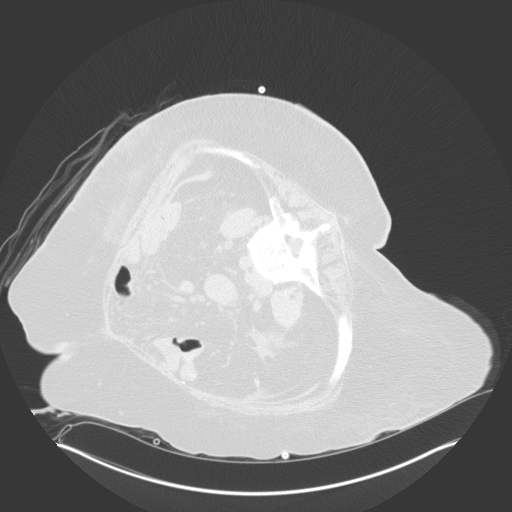
[im 47/52  soft-tissue]
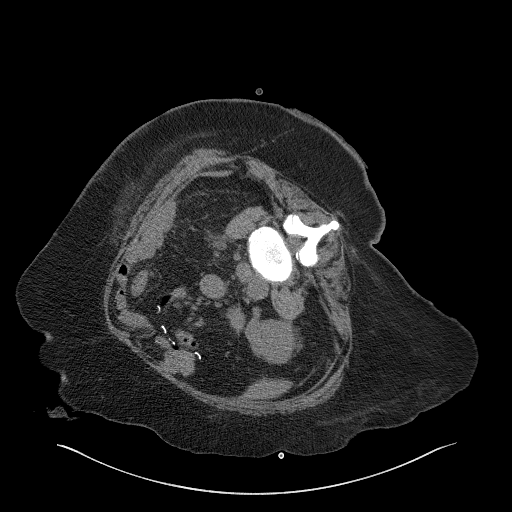
[im 47/52  lung]
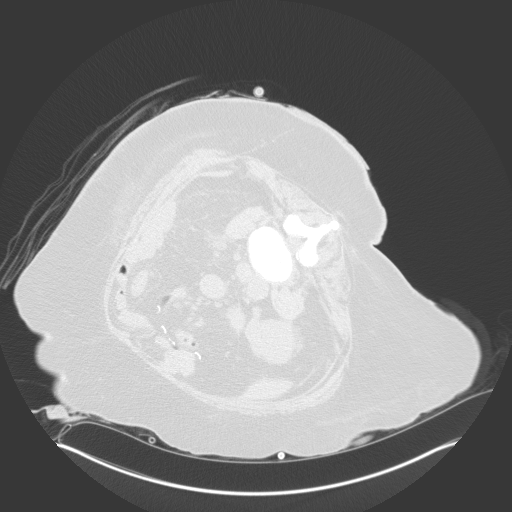

[11 of 32 positions shown; findings below may reference images not displayed]

EXAM:
CT-GUIDED DRAINAGE PRESACRAL ABSCESS

MEDICATIONS:
NONE

ANESTHESIA/SEDATION:
Fentanyl 4.0 mcg IV; Versed 200 mg IV

Moderate Sedation Time:  50 MINUTES

The patient was continuously monitored during the procedure by the
interventional radiology nurse under my direct supervision.

COMPLICATIONS:
None

PROCEDURE:
Informed written consent was obtained from the patient after a
thorough discussion of the procedural risks, benefits and
alternatives. All questions were addressed. Maximal Sterile Barrier
Technique was utilized including caps, mask, sterile gowns, sterile
gloves, sterile drape, hand hygiene and skin antiseptic. A timeout
was performed prior to the initiation of the procedure.

Patient position right decubitus and scout CT was obtained.

Patient is prepped and draped in usual sterile fashion. 1% lidocaine
was used for local anesthesia.

Using CT guidance, modified Seldinger technique was used to place a
10 French drain into the presacral space.

Approximately 3-4 cc of serosanguineous fluid aspirated and sent for
culture.

Patient tolerated the procedure well and remained hemodynamically
stable throughout.

No complications were encountered and no significant blood loss.
IMPRESSION: Status post CT-guided drainage of presacral abscess.

## 2018-09-11 ENCOUNTER — Encounter (INDEPENDENT_AMBULATORY_CARE_PROVIDER_SITE_OTHER): Payer: Self-pay | Admitting: Family Medicine

## 2018-09-11 ENCOUNTER — Ambulatory Visit (INDEPENDENT_AMBULATORY_CARE_PROVIDER_SITE_OTHER): Payer: BLUE CROSS/BLUE SHIELD | Admitting: Family Medicine

## 2018-09-11 VITALS — BP 96/65 | HR 89 | Temp 98.5°F | Ht 62.0 in | Wt 192.0 lb

## 2018-09-11 DIAGNOSIS — Z6835 Body mass index (BMI) 35.0-35.9, adult: Secondary | ICD-10-CM | POA: Diagnosis not present

## 2018-09-11 DIAGNOSIS — Z9189 Other specified personal risk factors, not elsewhere classified: Secondary | ICD-10-CM

## 2018-09-11 DIAGNOSIS — F3289 Other specified depressive episodes: Secondary | ICD-10-CM | POA: Diagnosis not present

## 2018-09-11 DIAGNOSIS — Z933 Colostomy status: Secondary | ICD-10-CM | POA: Diagnosis not present

## 2018-09-11 DIAGNOSIS — L89319 Pressure ulcer of right buttock, unspecified stage: Secondary | ICD-10-CM | POA: Diagnosis not present

## 2018-09-11 MED ORDER — BUPROPION HCL ER (SR) 150 MG PO TB12: 150.0000 mg | ORAL_TABLET | Freq: Every day | ORAL | 0 refills | Status: DC

## 2018-09-11 NOTE — Progress Notes (Signed)
Office: [phone]  /  Fax: [phone]   HPI:   Chief Complaint: OBESITY [name] is here to discuss her progress with her obesity treatment plan. She is on the Keto diet of 65 grams of protein and less than 30 grams of carbs and is following her eating plan approximately 95 % of the time. She states she is exercising 0 minutes 0 times per week. [name] continues to lose weight, but is struggling with emotional eating and using food for comfort.  Her weight is 192 lb (87.1 kg) today and has had a weight loss of 1 pound over a period of 4 weeks since her last visit. She has lost 16 lbs since starting treatment with [place].  Depression with emotional eating behaviors [name] is tearful in the office and feels overwhelmed with her health issues and frustrated with her weight loss efforts. She is struggling with emotional eating and using food for comfort to the extent that it is negatively impacting her health. She often snacks when she is not hungry. [name] sometimes feels she is out of control and then feels guilty that she made poor food choices. She has been working on behavior modification techniques to help reduce her emotional eating and has been somewhat successful.   At risk for cardiovascular disease [name] is at a higher than average risk for cardiovascular disease due to depression and obesity. She currently denies any chest pain.  ASSESSMENT AND PLAN:  Other depression - with emotional eating - Plan: buPROPion (WELLBUTRIN SR) 150 MG 12 hr tablet  At risk for heart disease  Class 2 severe obesity with serious comorbidity and body mass index (BMI) of 35.0 to 35.9 in adult, unspecified obesity type ([place])  PLAN:  Depression with Emotional Eating Behaviors We discussed behavior modification techniques today to help [name] deal with her emotional eating and depression. She has agreed to take Wellbutrin SR 174m qAM #30 with no refills and agreed to follow up as directed in 3  weeks.  Cardiovascular risk counseling [name] given extended (15 minutes) coronary artery disease prevention counseling today. She is 63y.o. female and has risk factors for heart disease including depression and obesity. We discussed intensive lifestyle modifications today with an emphasis on specific weight loss instructions and strategies. Pt was also informed of the importance of increasing exercise and decreasing saturated fats to help prevent heart disease.  Obesity PDavianis currently in the action stage of change. As such, her goal is to continue with weight loss efforts. She has agreed to keep a food journal with 1200 to 1300 calories and 75+ grams of protein.  [name] been instructed to work up to a goal of 150 minutes of combined cardio and strengthening exercise per week for weight loss and overall health benefits. We discussed the following Behavioral Modification Strategies today: increasing lean protein intake and decreasing simple carbohydrates.   PAyslinhas agreed to follow up with our clinic in 3 weeks. She was informed of the importance of frequent follow up visits to maximize her success with intensive lifestyle modifications for her multiple health conditions.  ALLERGIES: Allergies  Allergen Reactions  . Penicillins Swelling    Facial swelling/childhood allergy Has patient had a PCN reaction causing immediate rash, facial/tongue/throat swelling, SOB or lightheadedness with hypotension: Yes Has patient had a PCN reaction causing severe rash involving mucus membranes or skin necrosis: Yes Has patient had a PCN reaction that required hospitalization yes Has patient had a PCN reaction occurring within the  last 10 years: No If all of the above answers are "NO", then may proceed with Cephalosporin use.   . Cefaclor Rash    Ceclor  . Erythromycin Other (See Comments)    Gastritis, abd cramps  . Tape Rash    blisters  . Trimethoprim Rash  . Ultram [Tramadol] Hives  .  Cephalosporins Rash  . Fluconazole Rash  . Oxycodone Other (See Comments)    " I just feel weird"  . Pectin Rash    Pectin ring for stoma  . Septra [Sulfamethoxazole-Trimethoprim] Rash  . Sulfa Antibiotics Rash    MEDICATIONS: Current Outpatient Medications on File Prior to Visit  Medication Sig Dispense Refill  . acetaminophen (TYLENOL) 325 MG tablet Take 2 tablets (650 mg total) by mouth every 6 (six) hours as needed. 30 tablet 1  . anastrozole (ARIMIDEX) 1 MG tablet TAKE 1 TABLET DAILY (Patient taking differently: Take 1 mg by mouth daily. ) 90 tablet 3  . Biotin 5 MG TABS Take 5 mg by mouth every morning.     . Cholecalciferol (VITAMIN D3) 10000 UNITS capsule Take 10,000 Units by mouth once a week. Sunday evening's    . clobetasol (OLUX) 0.05 % topical foam Apply topically 2 (two) times daily.    . diphenhydrAMINE (BENADRYL) 25 mg capsule Take 1 capsule (25 mg total) by mouth every 8 (eight) hours as needed for itching, allergies or sleep. 30 capsule 0  . diphenoxylate-atropine (LOMOTIL) 2.5-0.025 MG tablet Take 1 tablet by mouth 4 (four) times daily as needed for diarrhea or loose stools. TO PREVENT LOOSE BOWEL MOVEMENTS (Patient taking differently: Take 1 tablet by mouth every other day. TO PREVENT LOOSE BOWEL MOVEMENTS) 30 tablet 0  . fentaNYL (DURAGESIC) 50 MCG/HR Place 1 patch onto the skin every 3 (three) days. 10 patch 0  . ferrous sulfate 325 (65 FE) MG tablet Take 1 tablet (325 mg total) by mouth at bedtime. 30 tablet 3  . HYDROcodone-acetaminophen (NORCO/VICODIN) 5-325 MG tablet Take 1 tablet by mouth every 6 (six) hours as needed for moderate pain. 30 tablet 0  . JANUVIA 50 MG tablet Take 50 mg by mouth daily.     [name] Kitchen levothyroxine (SYNTHROID, LEVOTHROID) 150 MCG tablet Take 1 tablet (150 mcg total) by mouth daily before breakfast. 30 tablet 1  . loratadine (CLARITIN) 10 MG tablet Take 10 mg by mouth every morning.     [name] Kitchen MONUROL 3 g PACK     . nitrofurantoin,  macrocrystal-monohydrate, (MACROBID) 100 MG capsule Take 100 mg by mouth at bedtime.    [name] Kitchen omega-3 acid ethyl esters (LOVAZA) 1 G capsule Take 1 g by mouth 2 (two) times daily.     Vladimir Faster Glycol-Propyl Glycol (SYSTANE OP) Place 1 drop into both eyes daily as needed (dry eyes).     . pregabalin (LYRICA) 50 MG capsule TAKE 1 CAPSULE(50 MG) BY MOUTH TWICE DAILY 180 capsule 0  . Prenatal Vit-Fe Fumarate-FA (PRENATAL VITAMIN PO) Take 1 capsule by mouth daily. Takes prenatal because there are no dyes in it    . rosuvastatin (CRESTOR) 10 MG tablet Take 10 mg by mouth every evening.     . Saccharomyces boulardii (FLORASTOR PO) Take 1 capsule by mouth daily.    . sodium bicarbonate 650 MG tablet Take 650 mg by mouth 2 (two) times daily.    . sodium chloride 0.9 % injection Inject 10 mLs into the vein daily.    . Tbo-Filgrastim (GRANIX) 480 MCG/0.8ML SOSY injection Inject 0.8  mLs (480 mcg total) into the skin daily. (Patient taking differently: Inject 480 mcg into the skin See admin instructions. Every 6 days) 30 Syringe 11   No current facility-administered medications on file prior to visit.     PAST MEDICAL HISTORY: Past Medical History:  Diagnosis Date  . Adrenal adenoma, left 02/08/2016   CT: stable benign  . Anemia in neoplastic disease   . Back pain   . Benign essential HTN   . 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 Cancer of corpus uteri, except isthmus [facility])  oncologist-- dr Denman [name] and dr Alvy Bimler    10-15-2004  dx endometroid endometrial and ovarian cancer s/p  chemotheapy and surgery(TAH w/ BSO) :  recurrent 11-19-2014 post pelvic surgery and radiation 01-29-2015 to 03-10-2015  . Chronic idiopathic neutropenia (HCC)    presumed related to chemotherapy March 2006--- followed by dr Alvy Bimler (treatment  w/ G-CSF injections  . Chronic nausea   . Chronic pain    perineal/ anal  area from bladder pad irritates skin , right flank pain  . CKD stage G2/A3, GFR 60-89 and albumin creatinine ratio >300 mg/g    nephrologist-  dr Madelon Lips  . Colovesical fistula   . Diabetic retinopathy, background ([place])   . Difficult intravenous access    small veins--- hx PICC lines  . DM type 2 (diabetes mellitus, type 2) ([place])    monitored by dr Legrand Como altheimer  . Dysuria   . Environmental and seasonal allergies   . Fatty liver 02/08/2016   CT  . Generalized muscle weakness   . GERD (gastroesophageal reflux disease)   . Hiatal hernia   . History of abdominal abscess 04/16/2017   post surgery 04-01-2017  --- resolved 10/ 2018  . History of gastric polyp    2014  duodenum  . History of ileus 04/16/2017   resolved w/ no surgical intervention  . History of radiation therapy    01-29-2015 to 03-10-2015  pelvis 50.4Gy  . Hypothyroidism    monitored by dr Legrand Como altheimer  . IBS (irritable bowel syndrome)   . Ileostomy in place [facility]) 04/01/2017   created at same time colostomy takedown.  . Joint pain   . Leg edema   . Lower urinary tract symptoms (LUTS)    urge urinary  incontinence  . Mixed dyslipidemia   . Multiple thyroid nodules    Managed by Dr. Harlow Asa  . Nephrostomy status ([place])   . Palpitations   . Pelvic abscess in female 04/16/2017  . PONV (postoperative nausea and vomiting)    "scopolamine patch works for me"  . Radiation-induced dermatitis    contact dermatitis , radiation completed, rash only on ankles now.  . Seasonal allergies   . Ureteral stricture, right UROLOGIT-  DR [facility] [place]   CHRONIC--  TREATMENT URETERAL STENT  . 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 Vitamin D deficiency   . Wears glasses     PAST SURGICAL HISTORY: Past Surgical History:  Procedure Laterality Date  . APPENDECTOMY    . biopsy thyroid nodules    . BREAST RECONSTRUCTION WITH PLACEMENT OF  TISSUE EXPANDER AND FLEX HD (ACELLULAR HYDRATED DERMIS) Bilateral 12/11/2015   Procedure: BILATERAL BREAST RECONSTRUCTION WITH PLACEMENT OF TISSUE EXPANDERS;  Surgeon: Irene Limbo, MD;  Location: [place];  Service: Plastics;  Laterality:  Bilateral;  . COLONOSCOPY WITH PROPOFOL N/A 08/21/2013   Procedure: COLONOSCOPY WITH PROPOFOL;  Surgeon: Cleotis Nipper, MD;  [facility];  Service: Endoscopy;  Laterality: N/A;  . COLOSTOMY TAKEDOWN N/A 12/04/2014   Procedure: LAPROSCOPIC LYSIS OF ADHESIONS, SPLENIC MOBILIZATION, RELOCATION OF COLOSTOMY, DEBRIDEMENT INITIAL COLOSTOMY SITE;  Surgeon: Michael [name], MD;  [facility];  Service: General;  Laterality: N/A;  . CYSTOGRAM N/A 06/01/2017   Procedure: CYSTOGRAM;  Surgeon: Alexis Frock, MD;  [facility];  Service: Urology;  Laterality: N/A;  . CYSTOSCOPY W/ RETROGRADES Right 11/21/2015   Procedure: CYSTOSCOPY WITH RETROGRADE PYELOGRAM;  Surgeon: Alexis Frock, MD;  [facility];  Service: Urology;  Laterality: Right;  . CYSTOSCOPY W/ URETERAL STENT PLACEMENT Right 11/21/2015   Procedure: CYSTOSCOPY WITH STENT REPLACEMENT;  Surgeon: Alexis Frock, MD;  [facility];  Service: Urology;  Laterality: Right;  . CYSTOSCOPY W/ URETERAL STENT PLACEMENT Right 03/10/2016   Procedure: CYSTOSCOPY WITH STENT REPLACEMENT;  Surgeon: Alexis Frock, MD;  Location: [facility];  Service: Urology;  Laterality: Right;  . CYSTOSCOPY W/ URETERAL STENT PLACEMENT Right 06/30/2016   Procedure: CYSTOSCOPY WITH RETROGRADE PYELOGRAM/URETERAL STENT EXCHANGE;  Surgeon: Alexis Frock, MD;  Location: [facility];  Service: Urology;  Laterality: Right;  . CYSTOSCOPY W/ URETERAL STENT PLACEMENT N/A 06/01/2017   Procedure: CYSTOSCOPY WITH EXAM UNDER ANESTHESIA;  Surgeon: Alexis Frock, MD;  [facility];  Service: Urology;  Laterality: N/A;  . CYSTOSCOPY W/ URETERAL STENT PLACEMENT Right 08/17/2017   Procedure: CYSTOSCOPY  WITH RETROGRADE PYELOGRAM/URETERAL STENT REMOVAL;  Surgeon: Alexis Frock, MD;  Location: [facility];  Service: Urology;  Laterality: Right;  . CYSTOSCOPY WITH RETROGRADE PYELOGRAM, URETEROSCOPY AND STENT PLACEMENT Right 03/20/2015   Procedure: CYSTOSCOPY WITH RETROGRADE PYELOGRAM, URETEROSCOPY WITH BALLOON DILATION AND STENT PLACEMENT ON RIGHT;  Surgeon: Alexis Frock, MD;  Location: [facility];  Service: Urology;  Laterality: Right;  . CYSTOSCOPY WITH RETROGRADE PYELOGRAM, URETEROSCOPY AND STENT PLACEMENT Right 05/02/2015   Procedure: CYSTOSCOPY WITH RIGHT RETROGRADE PYELOGRAM,  DIAGNOSTIC URETEROSCOPY AND STENT PULL ;  Surgeon: Alexis Frock, MD;  Location: [facility];  Service: Urology;  Laterality: Right;  . CYSTOSCOPY WITH RETROGRADE PYELOGRAM, URETEROSCOPY AND STENT PLACEMENT Right 09/05/2015   Procedure: CYSTOSCOPY WITH RETROGRADE PYELOGRAM,  AND STENT PLACEMENT;  Surgeon: Alexis Frock, MD;  [facility];  Service: Urology;  Laterality: Right;  . CYSTOSCOPY WITH RETROGRADE PYELOGRAM, URETEROSCOPY AND STENT PLACEMENT Right 04/01/2017   Procedure: CYSTOSCOPY WITH RETROGRADE PYELOGRAM, URETEROSCOPY AND STENT PLACEMENT;  Surgeon: Alexis Frock, MD;  [facility];  Service: Urology;  Laterality: Right;  . CYSTOSCOPY WITH STENT PLACEMENT Right 10/27/2016   Procedure: CYSTOSCOPY WITH STENT CHANGE and right retrograde pyelogram;  Surgeon: Alexis Frock, MD;  Location: [facility];  Service: Urology;  Laterality: Right;  . EUS N/A 10/02/2014   Procedure: LOWER ENDOSCOPIC ULTRASOUND (EUS);  Surgeon: Arta Silence, MD;  Location: [facility];  Service: Endoscopy;  Laterality: N/A;  . EXCISION SOFT TISSUE MASS RIGHT FOREMAN  12-08-2006  . EYE SURGERY  as child   pytosis of eyelids repair  . INCISION AND DRAINAGE OF WOUND Bilateral 12/26/2015   Procedure: DEBRIDEMENT OF BILATERAL MASTECTOMY FLAPS;  Surgeon: Irene Limbo, MD;   Location: [place];  Service: Plastics;  Laterality: Bilateral;  . IR CV LINE INJECTION  05/31/2017  . IR FLUORO GUIDE CV LINE LEFT  05/31/2017  . IR FLUORO GUIDE CV LINE RIGHT  04/06/2017  . IR FLUORO GUIDE CV MIDLINE PICC RIGHT  05/30/2017  . IR NEPHROSTOGRAM LEFT INITIAL PLACEMENT  09/02/2017  . IR NEPHROSTOGRAM LEFT THRU EXISTING ACCESS  11/29/2017  . IR NEPHROSTOGRAM RIGHT INITIAL PLACEMENT  09/02/2017  . IR NEPHROSTOGRAM RIGHT THRU EXISTING ACCESS  09/13/2017  . IR NEPHROSTOGRAM RIGHT THRU EXISTING ACCESS  11/29/2017  . IR NEPHROSTOMY EXCHANGE LEFT  11/28/2017  . IR NEPHROSTOMY EXCHANGE LEFT  01/05/2018  . IR NEPHROSTOMY EXCHANGE LEFT  02/16/2018  . IR NEPHROSTOMY EXCHANGE LEFT  03/30/2018  . IR NEPHROSTOMY EXCHANGE LEFT  05/12/2018  . IR NEPHROSTOMY EXCHANGE LEFT  06/21/2018  . IR NEPHROSTOMY EXCHANGE LEFT  08/04/2018  . IR NEPHROSTOMY EXCHANGE RIGHT  10/02/2017  . IR NEPHROSTOMY EXCHANGE RIGHT  11/28/2017  . IR NEPHROSTOMY EXCHANGE RIGHT  01/05/2018  . IR NEPHROSTOMY EXCHANGE RIGHT  02/16/2018  . IR NEPHROSTOMY EXCHANGE RIGHT  03/30/2018  . IR NEPHROSTOMY EXCHANGE RIGHT  05/12/2018  . IR NEPHROSTOMY EXCHANGE RIGHT  06/21/2018  . IR NEPHROSTOMY EXCHANGE RIGHT  08/04/2018  . IR NEPHROSTOMY PLACEMENT LEFT  10/02/2017  . IR RADIOLOGIST EVAL & MGMT  05/03/2017  . IR US GUIDE VASC ACCESS LEFT  05/31/2017  . IR US GUIDE VASC ACCESS RIGHT  04/06/2017  . IR US GUIDE VASC ACCESS RIGHT  05/30/2017  . LAPAROSCOPIC CHOLECYSTECTOMY  1990  . LIPOSUCTION WITH LIPOFILLING Bilateral 04/16/2016   Procedure: LIPOSUCTION WITH LIPOFILLING TO BILATERAL CHEST;  Surgeon: Irene Limbo, MD;  Location: [place];  Service: Plastics;  Laterality: Bilateral;  . MASTECTOMY W/ SENTINEL NODE BIOPSY Bilateral 12/11/2015   Procedure: RIGHT PROPHYLACTIC MASTECTOMY, LEFT TOTAL MASTECTOMY WITH LEFT AXILLARY SENTINEL LYMPH NODE BIOPSY;  Surgeon: Stark Klein, MD;  Location: [place];  Service: General;   Laterality: Bilateral;  . OSTOMY N/A 11/19/2014   Procedure: OSTOMY;  Surgeon: Michael [name], MD;  [facility];  Service: General;  Laterality: N/A;  . PROCTOSCOPY N/A 04/01/2017   Procedure: RIDGE PROCTOSCOPY;  Surgeon: Michael [name], MD;  [facility];  Service: General;  Laterality: N/A;  . REMOVAL OF BILATERAL TISSUE EXPANDERS WITH PLACEMENT OF BILATERAL BREAST IMPLANTS Bilateral 04/16/2016   Procedure: REMOVAL OF BILATERAL TISSUE EXPANDERS WITH PLACEMENT OF BILATERAL BREAST IMPLANTS;  Surgeon: Irene Limbo, MD;  Location: [place];  Service: Plastics;  Laterality: Bilateral;  . ROBOTIC ASSISTED LAP VAGINAL HYSTERECTOMY N/A 11/19/2014   Procedure: ROBOTIC LYSIS OF ADHESIONS, CONVERTED TO LAPAROTOMY RADICAL UPPER VAGINECTOMY,LOW ANTERIOR BOWEL RESECTION, COLOSTOMY, BILATERAL URETERAL STENT PLACEMENT AND CYSTONOMY CLOSURE;  Surgeon: Everitt Amber, MD;  [facility];  Service: Gynecology;  Laterality: N/A;  . TISSUE EXPANDER FILLING Bilateral 12/26/2015   Procedure: EXPANSION OF BILATERAL CHEST TISSUE EXPANDERS (60 mL- Right; 75 mL- Left);  Surgeon: Irene Limbo, MD;  Location: [place];  Service: Plastics;  Laterality: Bilateral;  . TONSILLECTOMY    . TOTAL ABDOMINAL HYSTERECTOMY  March 2006   [facility]   and Bilateral Salpingoophorectomy/  staging for Ovarian cancer/  an  . XI ROBOTIC ASSISTED LOWER ANTERIOR RESECTION N/A 04/01/2017   Procedure: XI ROBOTIC VS LAPAROSCOPIC COLOSTOMY TAKEDOWN WITH LYSIS OF ADHESIONS.;  Surgeon: Michael [name], MD;  [facility];  Service: General;  Laterality: N/A;  ERAS PATHWAY    SOCIAL HISTORY: Social History   Tobacco Use  . Smoking status: Never Smoker  . Smokeless tobacco: Never Used  Substance Use Topics  . Alcohol use: Not Currently  . Drug use: No    FAMILY  HISTORY: Family History  Problem Relation Age of Onset  . Cancer Mother 24       stomach ca  . Hypertension Mother   . Cancer Father 64        prostate ca  . Diabetes Father   . Heart disease Father        CABG  . Hypertension Father   . Hyperlipidemia Father   . Obesity Father   . Breast cancer Maternal Aunt        dx in her 46s  . Lymphoma Paternal Aunt   . Brain cancer Paternal Grandfather   . Ovarian cancer Other   . Diabetes Sister   . Hypertension Brother y-10  . Heart disease Brother        CABG  . Diabetes Brother     ROS: Review of Systems  Constitutional: Positive for weight loss.  Cardiovascular: Negative for chest pain.  Psychiatric/Behavioral: Positive for depression.    PHYSICAL EXAM: Blood pressure 96/65, pulse 89, temperature 98.5 F (36.9 C), temperature source Oral, height _0  (1.575 m), weight 192 lb (87.1 kg), SpO2 97 %. Body mass index is 35.12 kg/m. Physical Exam Vitals signs reviewed.  Constitutional:      Appearance: Normal appearance. She is obese.  Cardiovascular:     Rate and Rhythm: Normal rate.  Pulmonary:     Effort: Pulmonary effort is normal.  Musculoskeletal: Normal range of motion.  Skin:    General: Skin is warm and dry.  Neurological:     Mental Status: She is alert and oriented to person, place, and time.  Psychiatric:        Mood and Affect: Mood normal.        Behavior: Behavior normal.     RECENT LABS AND TESTS: BMET    Component Value Date/Time   NA 135 05/19/2018 1127   NA 141 01/24/2017 1228   K 4.3 05/19/2018 1127   K 4.6 01/24/2017 1228   CL 104 05/19/2018 1127   CO2 22 05/19/2018 1127   CO2 29 01/24/2017 1228   GLUCOSE 183 (H) 05/19/2018 1127   GLUCOSE 84 01/24/2017 1228   BUN 38 (H) 05/19/2018 1127   BUN 22.2 01/24/2017 1228   CREATININE 1.90 (H) 05/19/2018 1127   CREATININE 1.98 (H) 03/27/2018 1324   CREATININE 0.8 01/24/2017 1228   CALCIUM 9.6 05/19/2018 1127   CALCIUM 10.2 01/24/2017 1228   GFRNONAA 27 (L) 05/19/2018 1127   GFRNONAA 26 (L) 03/27/2018 1324   GFRNONAA 78 12/16/2014 1530   GFRAA 31 (L) 05/19/2018 1127   GFRAA 30  (L) 03/27/2018 1324   GFRAA >89 12/16/2014 1530   Lab Results  Component Value Date   HGBA1C 7.9 (H) 09/01/2017   HGBA1C 5.3 04/01/2017   HGBA1C 5.8 (H) 09/05/2015   HGBA1C 6.8 (H) 11/21/2014   HGBA1C 6.3 (H) 11/07/2014   No results found for: INSULIN CBC    Component Value Date/Time   WBC 4.4 05/19/2018 1127   RBC 3.78 (L) 05/19/2018 1127   HGB 10.4 (L) 05/19/2018 1127   HGB 10.2 (L) 03/27/2018 1324   HGB 12.4 07/29/2017 1444   HCT 35.1 (L) 05/19/2018 1127   HCT 38.0 07/29/2017 1444   PLT 350 05/19/2018 1127   PLT 268 03/27/2018 1324   PLT 260 07/29/2017 1444   MCV 92.9 05/19/2018 1127   MCV 90.9 07/29/2017 1444   MCH 27.5 05/19/2018 1127   MCHC 29.6 (L) 05/19/2018 1127   RDW 14.7 05/19/2018 1127  RDW 15.5 (H) 07/29/2017 1444   LYMPHSABS 0.7 05/19/2018 1127   LYMPHSABS 0.8 (L) 07/29/2017 1444   MONOABS 0.5 05/19/2018 1127   MONOABS 1.0 (H) 07/29/2017 1444   EOSABS 0.1 05/19/2018 1127   EOSABS 0.0 07/29/2017 1444   BASOSABS 0.1 05/19/2018 1127   BASOSABS 0.0 07/29/2017 1444   Iron/TIBC/Ferritin/ %Sat    Component Value Date/Time   IRON 7 (L) 08/31/2017 0451   IRON 12 (L) 11/29/2014 1251   TIBC 164 (L) 08/31/2017 0451   TIBC 331 11/29/2014 1251   FERRITIN 27 11/29/2014 1251   IRONPCTSAT 4 (L) 08/31/2017 0451   IRONPCTSAT 4 (L) 11/29/2014 1251   Lipid Panel     Component Value Date/Time   CHOL 109 08/28/2015 1617   TRIG 89 04/18/2017 0427   HDL 43.80 08/28/2015 1617   CHOLHDL 2 08/28/2015 1617   VLDL 19.8 08/28/2015 1617   LDLCALC 45 08/28/2015 1617   Hepatic Function Panel     Component Value Date/Time   PROT 8.5 (H) 05/19/2018 1127   PROT 6.9 01/24/2017 1228   ALBUMIN 2.8 (L) 05/19/2018 1127   ALBUMIN 3.4 (L) 01/24/2017 1228   AST 30 05/19/2018 1127   AST 14 (L) 03/27/2018 1324   AST 16 01/24/2017 1228   ALT 54 (H) 05/19/2018 1127   ALT 18 03/27/2018 1324   ALT 14 01/24/2017 1228   ALKPHOS 364 (H) 05/19/2018 1127   ALKPHOS 90 01/24/2017 1228    BILITOT 0.4 05/19/2018 1127   BILITOT 0.2 (L) 03/27/2018 1324   BILITOT 0.34 01/24/2017 1228      Component Value Date/Time   TSH 2.408 08/30/2017 0623   TSH 0.63 08/28/2015 1617   Results for YOULANDA, TOMASSETTI (MRN [id number]) as of 09/11/2018 16:05  Ref. Range 04/05/2018 11:45  Vitamin D, 25-Hydroxy Latest Ref Range: 30.0 - 100.0 ng/mL 40.8    OBESITY BEHAVIORAL INTERVENTION VISIT  Today's visit was # 8   Starting weight: 208 lbs Starting date: 04/05/18 Today's weight : Weight: 192 lb (87.1 kg)  Today's date: 09/11/2018 Total lbs lost to date: 16  ASK: We discussed the diagnosis of obesity with Carmelia Bake [place] today and Jennifr agreed to give [place] permission to discuss obesity behavioral modification therapy today.  ASSESS: Yulanda has the diagnosis of obesity and her BMI today is 35.1. Kellyjo is in the action stage of change.   ADVISE: Shama was educated on the multiple health risks of obesity as well as the benefit of weight loss to improve her health. She was advised of the need for long term treatment and the importance of lifestyle modifications to improve her current health and to decrease her risk of future health problems.  AGREE: Multiple dietary modification options and treatment options were discussed and Amberley agreed to follow the recommendations documented in the above note.  ARRANGE: Winston was educated on the importance of frequent visits to treat obesity as outlined per CMS and USPSTF guidelines and agreed to schedule her next follow up appointment today.  IMarcille Blanco, CMA, am acting as transcriptionist for Starlyn Skeans, MD  I have reviewed the above documentation for accuracy and completeness, and I agree with the above. -Dennard Nip, MD

## 2018-09-13 DIAGNOSIS — T83592A Infection and inflammatory reaction due to indwelling ureteral stent, initial encounter: Secondary | ICD-10-CM | POA: Diagnosis not present

## 2018-09-13 DIAGNOSIS — E860 Dehydration: Secondary | ICD-10-CM | POA: Diagnosis not present

## 2018-09-13 DIAGNOSIS — T82594D Other mechanical complication of infusion catheter, subsequent encounter: Secondary | ICD-10-CM | POA: Diagnosis not present

## 2018-09-13 IMAGING — CT CT ABD-PELV W/O CM
2 of 4 series · 16 of 46 positions shown, 18 images · non-contrast
Comparison: CT scan of September 15, 2017 and September 16, 2017.

CLINICAL DATA: Status post presacral abscess drainage and bilateral
nephrostomies. Generalized abdominal pain.

EXAM:
CT ABDOMEN AND PELVIS WITHOUT CONTRAST
TECHNIQUE: Multidetector CT imaging of the abdomen and pelvis was performed
following the standard protocol without IV contrast.

[Series 2: axial st · axial · 0.87mm/px · z∈[-659,-239]mm · 13 of 96 slices shown, 15 images]
[im 6/96  soft-tissue]
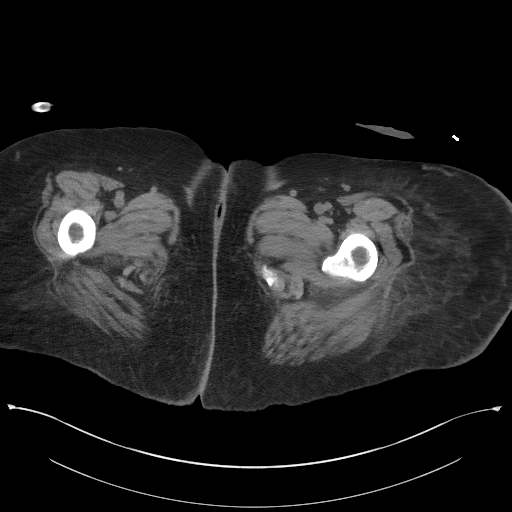
[im 6/96  bone]
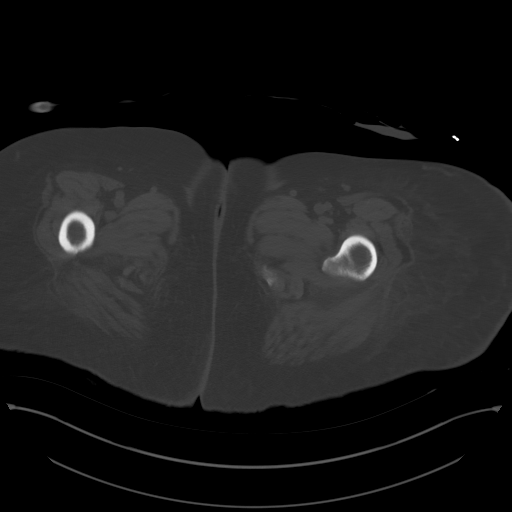
[im 12/96  soft-tissue]
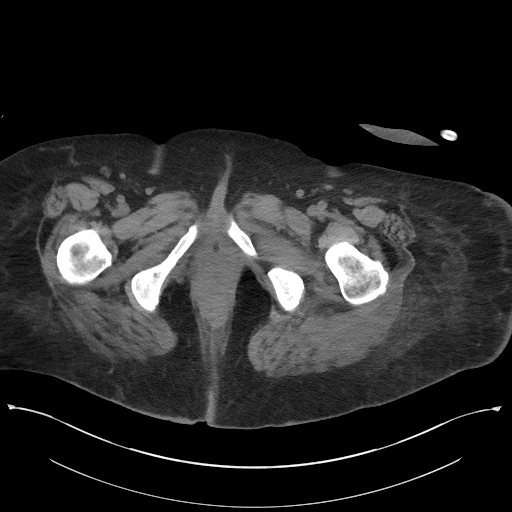
[im 23/96  soft-tissue]
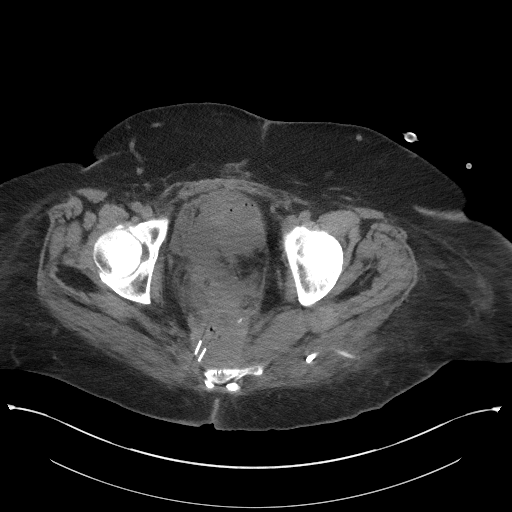
[im 28/96  soft-tissue]
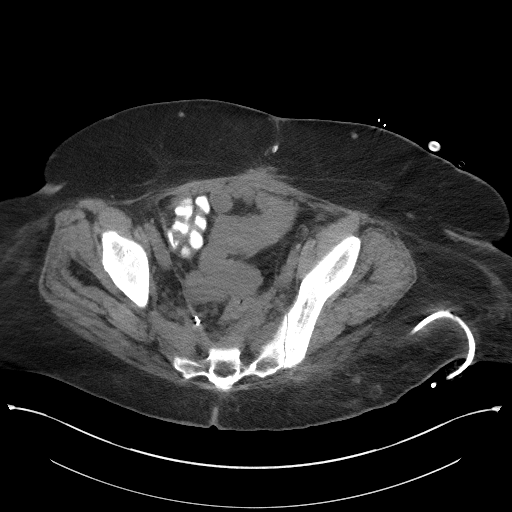
[im 34/96  soft-tissue]
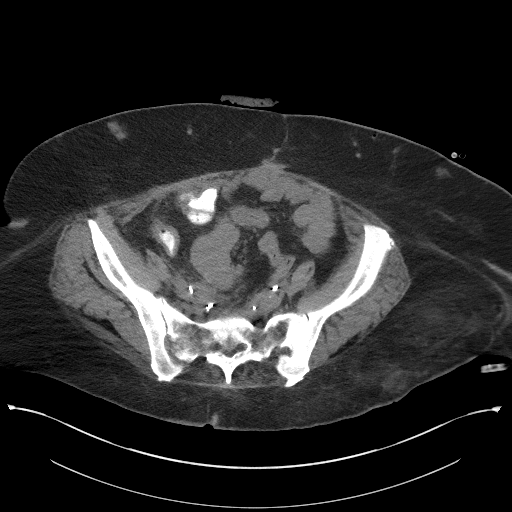
[im 40/96  soft-tissue]
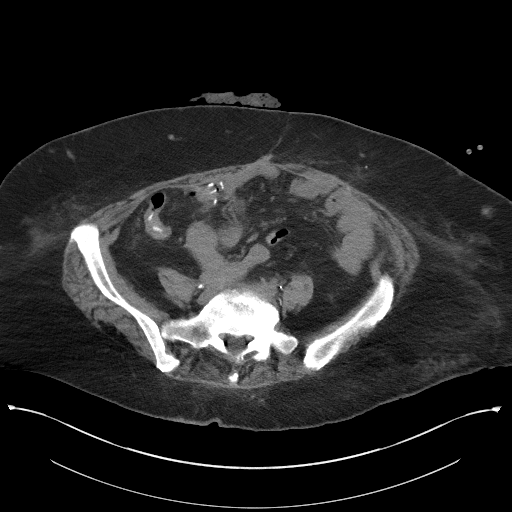
[im 51/96  soft-tissue]
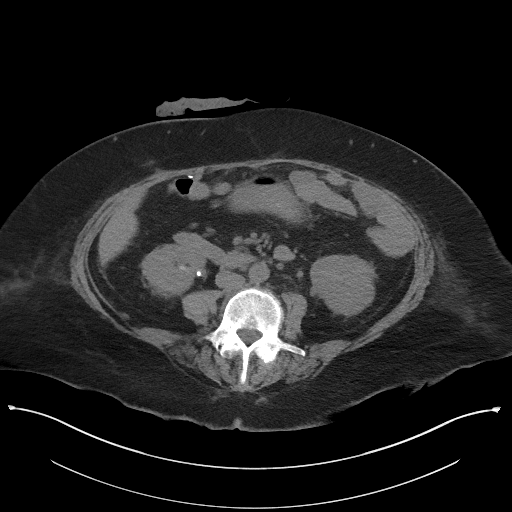
[im 56/96  soft-tissue]
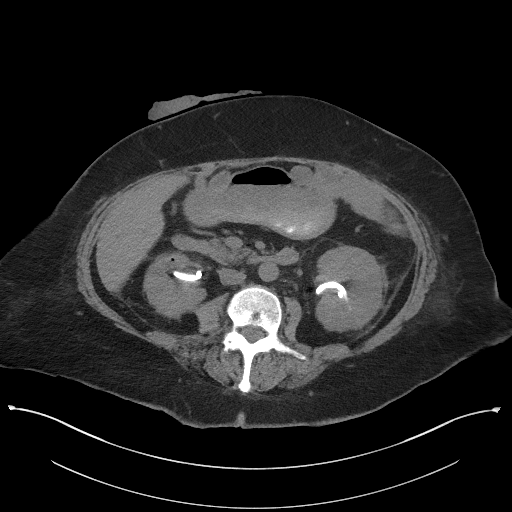
[im 62/96  soft-tissue]
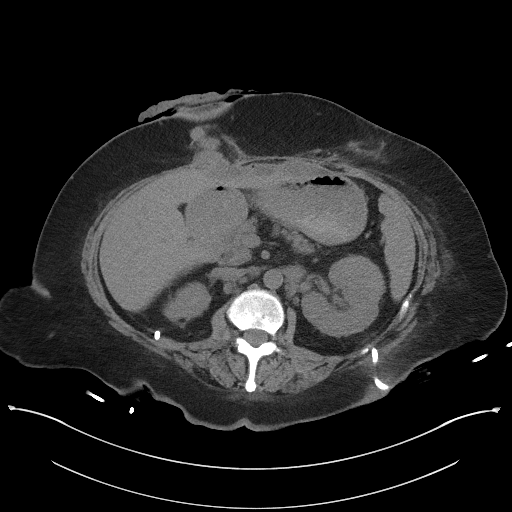
[im 62/96  bone]
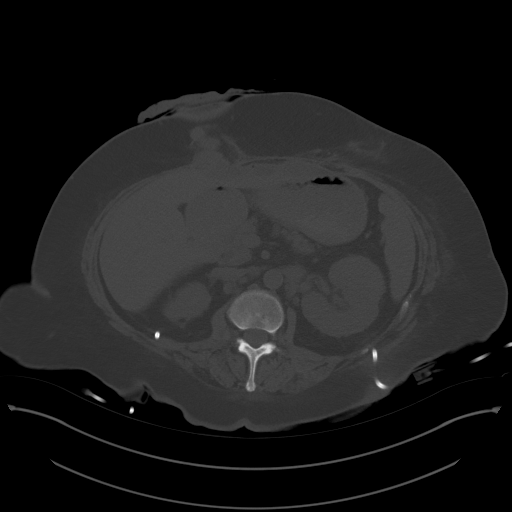
[im 68/96  soft-tissue]
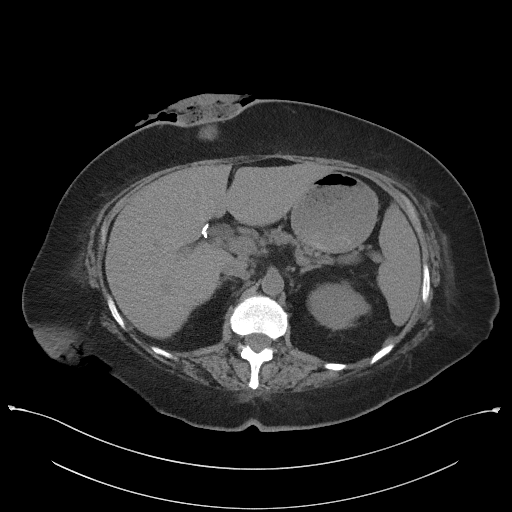
[im 73/96  soft-tissue]
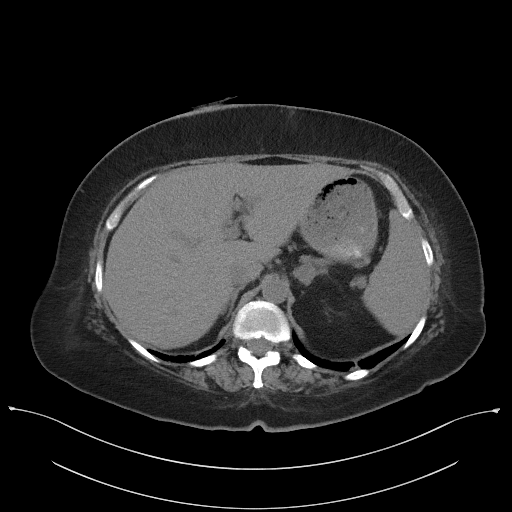
[im 84/96  soft-tissue]
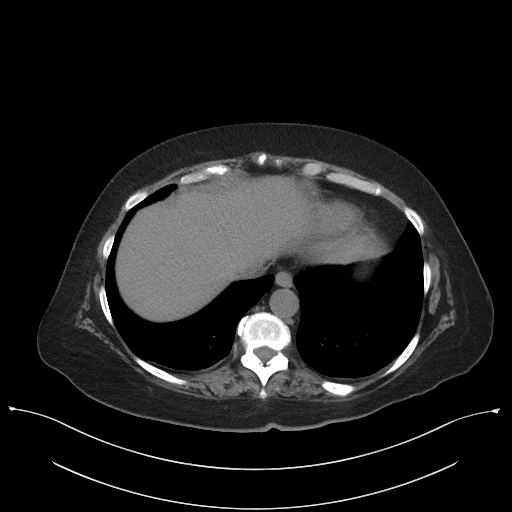
[im 90/96  soft-tissue]
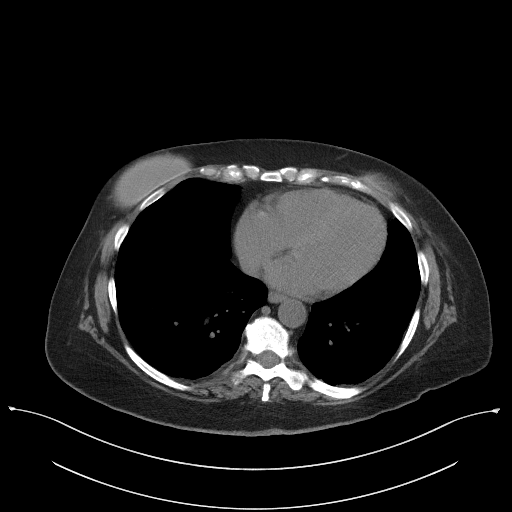

[Series 4: coronal st · coronal · 0.89mm/px · 3 of 116 slices shown]
[im 39/116  soft-tissue]
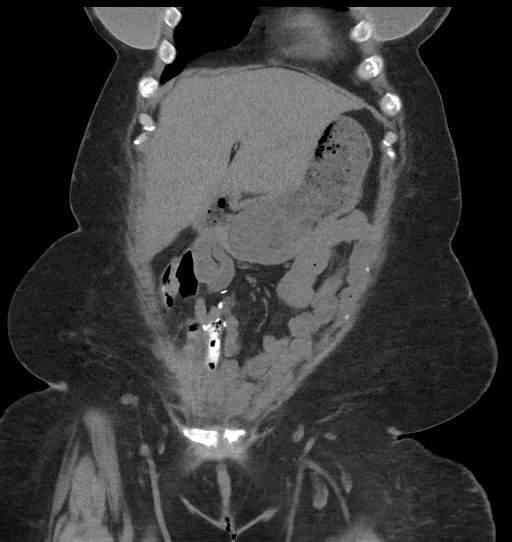
[im 52/116  soft-tissue]
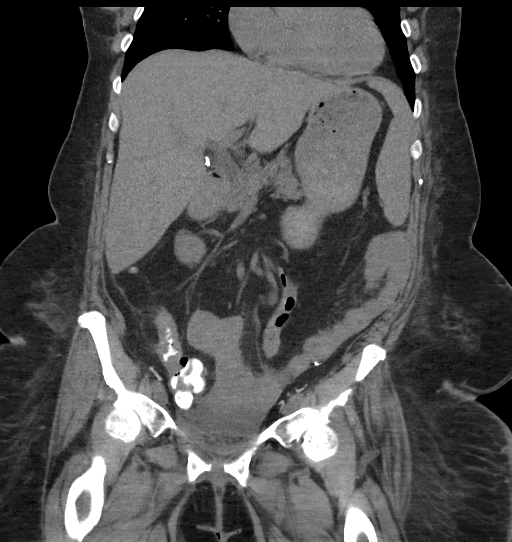
[im 64/116  soft-tissue]
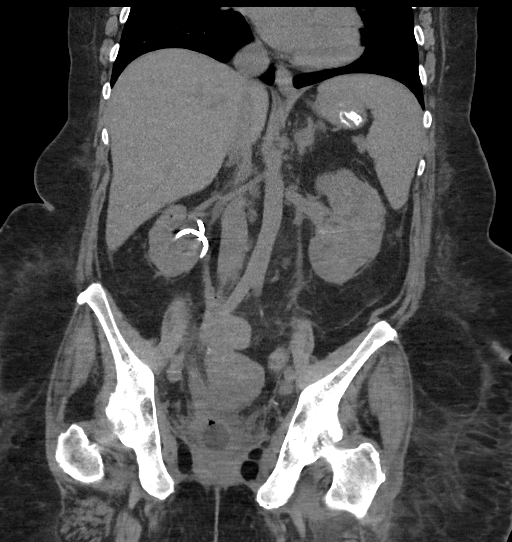

[16 of 46 positions shown; findings below may reference images not displayed]

FINDINGS: Lower chest: No acute abnormality.

Hepatobiliary: No focal liver abnormality is seen. Status post
cholecystectomy. No biliary dilatation.

Pancreas: Unremarkable. No pancreatic ductal dilatation or
surrounding inflammatory changes.

Spleen: Normal in size without focal abnormality.

Adrenals/Urinary Tract: Stable left adrenal nodule is noted. Right
adrenal gland appears normal. Bilateral nephrostomy tubes are noted
in grossly good position. No hydronephrosis or renal obstruction is
noted. Urinary bladder is decompressed secondary to Foley catheter.

Stomach/Bowel: The stomach appears normal. Ileostomy is noted in the
right upper quadrant. No abnormal bowel dilatation is noted.

Vascular/Lymphatic: No significant vascular findings are present. No
enlarged abdominal or pelvic lymph nodes.

Reproductive: Status post hysterectomy. No adnexal masses.

Other: The presacral fluid collection noted on prior exam may be
smaller compared to prior exam, although is difficult to assess due
to the lack of intravenous contrast. The drainage catheter placed
previously has withdrawn significantly, with the distal tip of the
catheter now present within the left gluteal musculature outside of
the pelvis.

Musculoskeletal: No acute or significant osseous findings.
IMPRESSION: The pelvic drainage catheter placed 1 week ago has significantly
withdrawn, with the distal tip now present within the left gluteal
musculature, not the presacral space. The presacral fluid collection
noted on prior exam may be smaller currently, but it is difficult to
assess due to lack of intravenous contrast.

Stable bilateral nephrostomy catheters are noted without
hydronephrosis or obstruction.

Stable left adrenal nodule most consistent with adenoma.

Probable ileostomy seen in right upper quadrant, and patient is
status post almost complete colectomy.

## 2018-09-15 ENCOUNTER — Other Ambulatory Visit (HOSPITAL_COMMUNITY): Payer: Self-pay

## 2018-09-17 ENCOUNTER — Encounter: Payer: Self-pay | Admitting: Family Medicine

## 2018-09-18 ENCOUNTER — Encounter (HOSPITAL_COMMUNITY): Payer: Self-pay | Admitting: Interventional Radiology

## 2018-09-18 ENCOUNTER — Other Ambulatory Visit: Payer: Self-pay | Admitting: Family Medicine

## 2018-09-18 ENCOUNTER — Other Ambulatory Visit (HOSPITAL_COMMUNITY): Payer: Self-pay | Admitting: Interventional Radiology

## 2018-09-18 ENCOUNTER — Ambulatory Visit (HOSPITAL_COMMUNITY)
Admission: RE | Admit: 2018-09-18 | Discharge: 2018-09-18 | Disposition: A | Discharge status: Home / Self Care | Payer: BLUE CROSS/BLUE SHIELD | Source: Ambulatory Visit | Attending: Interventional Radiology | Admitting: Interventional Radiology

## 2018-09-18 DIAGNOSIS — C569 Malignant neoplasm of unspecified ovary: Secondary | ICD-10-CM

## 2018-09-18 DIAGNOSIS — Z436 Encounter for attention to other artificial openings of urinary tract: Secondary | ICD-10-CM | POA: Diagnosis not present

## 2018-09-18 DIAGNOSIS — N135 Crossing vessel and stricture of ureter without hydronephrosis: Secondary | ICD-10-CM | POA: Diagnosis not present

## 2018-09-18 DIAGNOSIS — C541 Malignant neoplasm of endometrium: Secondary | ICD-10-CM

## 2018-09-18 DIAGNOSIS — C55 Malignant neoplasm of uterus, part unspecified: Secondary | ICD-10-CM

## 2018-09-18 HISTORY — PX: IR NEPHROSTOMY EXCHANGE LEFT: IMG6069

## 2018-09-18 HISTORY — PX: IR NEPHROSTOMY EXCHANGE RIGHT: IMG6070

## 2018-09-18 MED ORDER — FENTANYL 50 MCG/HR TD PT72: 1.0000 | MEDICATED_PATCH | TRANSDERMAL | 0 refills | Status: DC

## 2018-09-18 MED ORDER — IOPAMIDOL (ISOVUE-300) INJECTION 61%
INTRAVENOUS | Status: AC
  Administered 2018-09-18: 10 mL
  Filled 2018-09-18: qty 50

## 2018-09-18 MED ORDER — IOPAMIDOL (ISOVUE-300) INJECTION 61%
50.0000 mL | Freq: Once | INTRAVENOUS | Status: AC | PRN
  Administered 2018-09-18: 10 mL

## 2018-09-18 MED ORDER — LIDOCAINE HCL 1 % IJ SOLN
INTRAMUSCULAR | Status: AC
  Filled 2018-09-18: qty 20

## 2018-09-18 NOTE — Procedures (Signed)
B PCN exchange EBL 0 Comp 0

## 2018-09-18 NOTE — Telephone Encounter (Signed)
sent 

## 2018-09-18 NOTE — Telephone Encounter (Signed)
Pt is requesting refill on Fentanyl patch.   Last OV: 08/22/2018 Last Fill: 08/22/2018 #10 and 0RF UDS: 02/17/2018 Low risk

## 2018-09-19 ENCOUNTER — Telehealth: Payer: Self-pay | Admitting: *Deleted

## 2018-09-19 NOTE — Telephone Encounter (Signed)
Called and spoke with the patient regarding her appt on 6/2. Moved appt to 6/5 due to Dr. Denman [name] in the [place]

## 2018-09-20 DIAGNOSIS — L89312 Pressure ulcer of right buttock, stage 2: Secondary | ICD-10-CM | POA: Diagnosis not present

## 2018-09-20 DIAGNOSIS — T82594D Other mechanical complication of infusion catheter, subsequent encounter: Secondary | ICD-10-CM | POA: Diagnosis not present

## 2018-09-20 DIAGNOSIS — D709 Neutropenia, unspecified: Secondary | ICD-10-CM | POA: Diagnosis not present

## 2018-09-20 DIAGNOSIS — B9562 Methicillin resistant Staphylococcus aureus infection as the cause of diseases classified elsewhere: Secondary | ICD-10-CM | POA: Diagnosis not present

## 2018-09-20 DIAGNOSIS — N179 Acute kidney failure, unspecified: Secondary | ICD-10-CM | POA: Diagnosis not present

## 2018-09-20 DIAGNOSIS — L89153 Pressure ulcer of sacral region, stage 3: Secondary | ICD-10-CM | POA: Diagnosis not present

## 2018-09-20 DIAGNOSIS — E1122 Type 2 diabetes mellitus with diabetic chronic kidney disease: Secondary | ICD-10-CM | POA: Diagnosis not present

## 2018-09-20 DIAGNOSIS — T83592A Infection and inflammatory reaction due to indwelling ureteral stent, initial encounter: Secondary | ICD-10-CM | POA: Diagnosis not present

## 2018-09-20 DIAGNOSIS — D631 Anemia in chronic kidney disease: Secondary | ICD-10-CM | POA: Diagnosis not present

## 2018-09-20 DIAGNOSIS — Z452 Encounter for adjustment and management of vascular access device: Secondary | ICD-10-CM | POA: Diagnosis not present

## 2018-09-20 DIAGNOSIS — L89311 Pressure ulcer of right buttock, stage 1: Secondary | ICD-10-CM | POA: Diagnosis not present

## 2018-09-20 DIAGNOSIS — C541 Malignant neoplasm of endometrium: Secondary | ICD-10-CM | POA: Diagnosis not present

## 2018-09-20 DIAGNOSIS — N182 Chronic kidney disease, stage 2 (mild): Secondary | ICD-10-CM | POA: Diagnosis not present

## 2018-09-23 IMAGING — CT CT HEAD W/O CM
4 series · 17 of 47 positions shown, 19 images · non-contrast
Comparison: None.

CLINICAL DATA: Altered mental status.  Nonverbal.

EXAM:
CT HEAD WITHOUT CONTRAST
TECHNIQUE: Contiguous axial images were obtained from the base of the skull
through the vertex without intravenous contrast.

[Series 3: head without · axial · non-contrast · 0.41mm/px · z∈[-231,-111]mm · 6 of 34 slices shown, 8 images]
[im 5/34  brain]
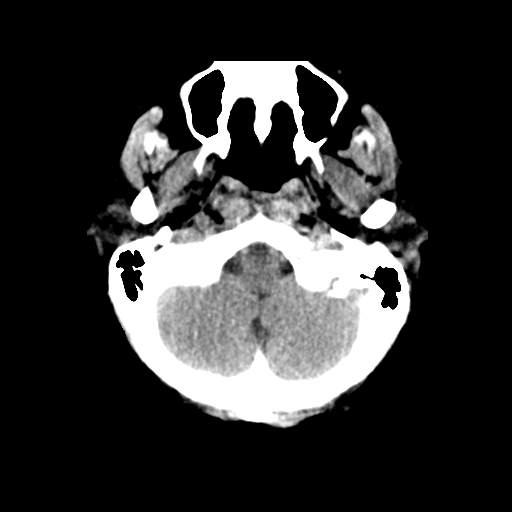
[im 5/34  bone]
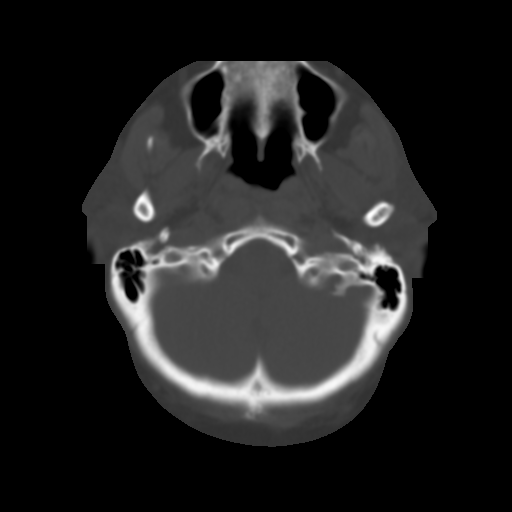
[im 10/34  brain]
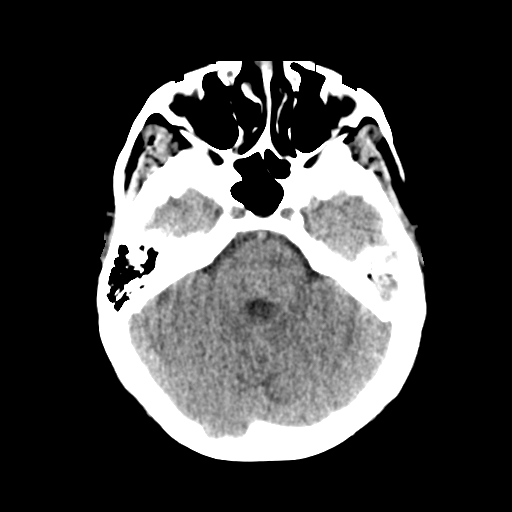
[im 15/34  brain]
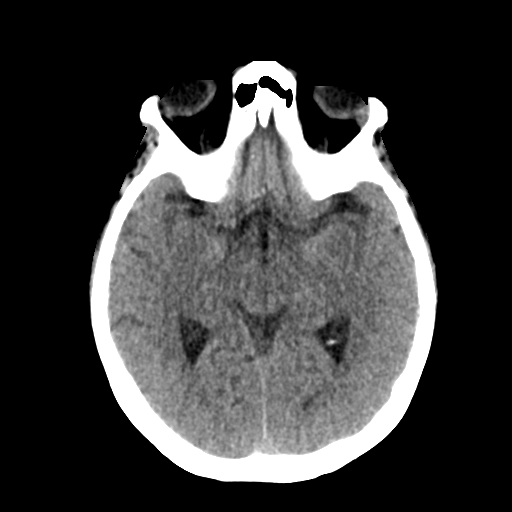
[im 19/34  brain]
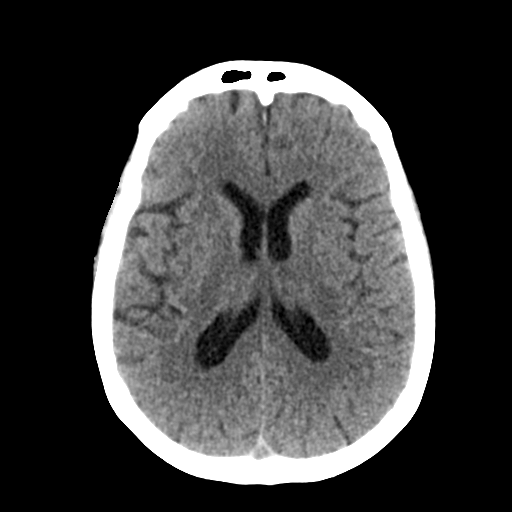
[im 24/34  brain]
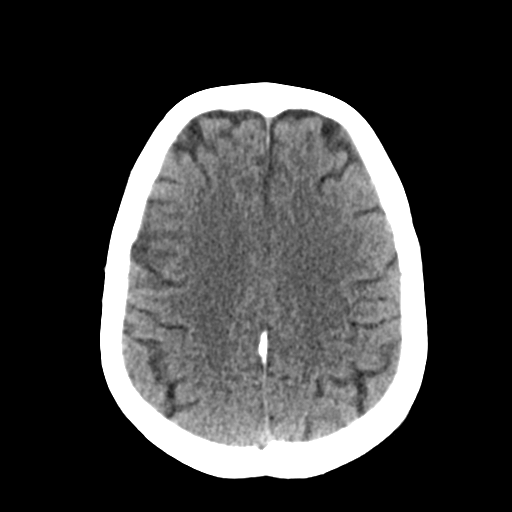
[im 24/34  bone]
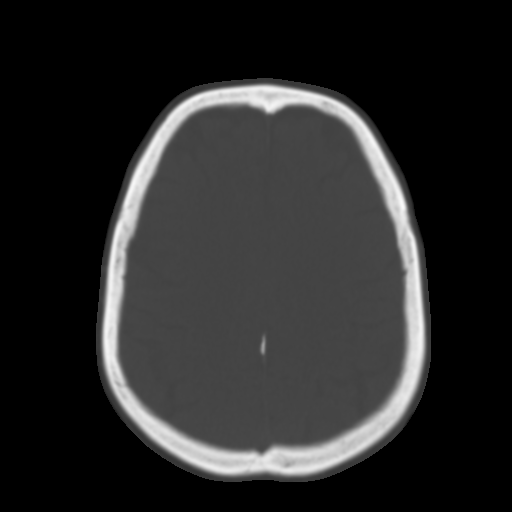
[im 29/34  brain]
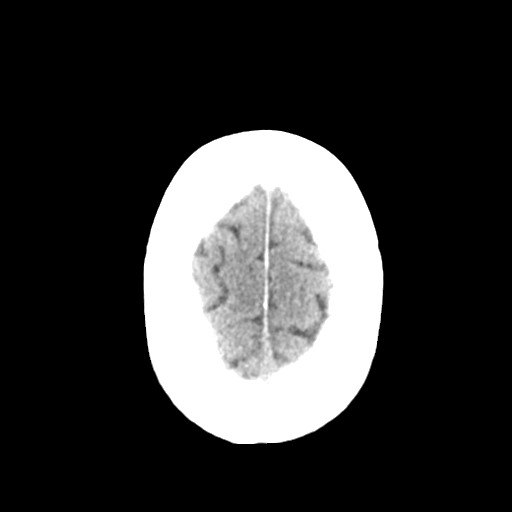

[Series 4: head bone · axial · 0.41mm/px · z∈[-235,-155]mm · 5 of 86 slices shown]
[im 9/86  bone]
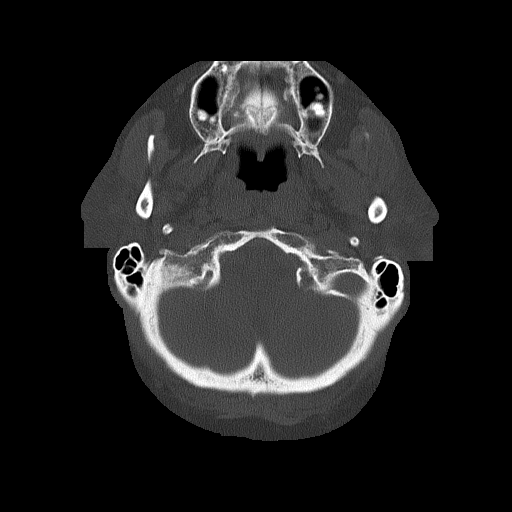
[im 17/86  bone]
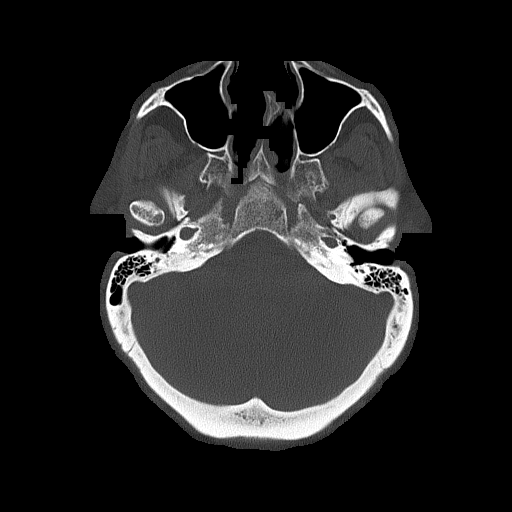
[im 29/86  bone]
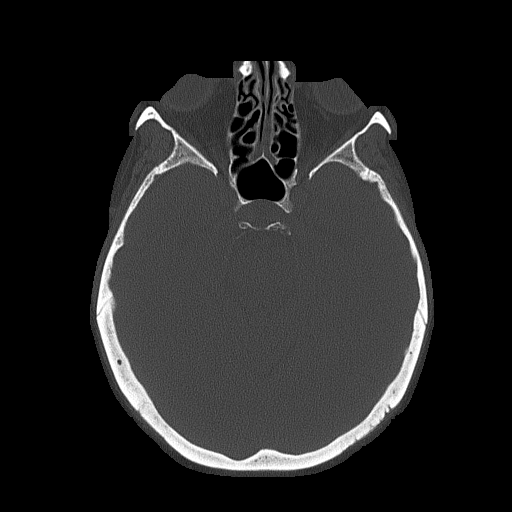
[im 37/86  bone]
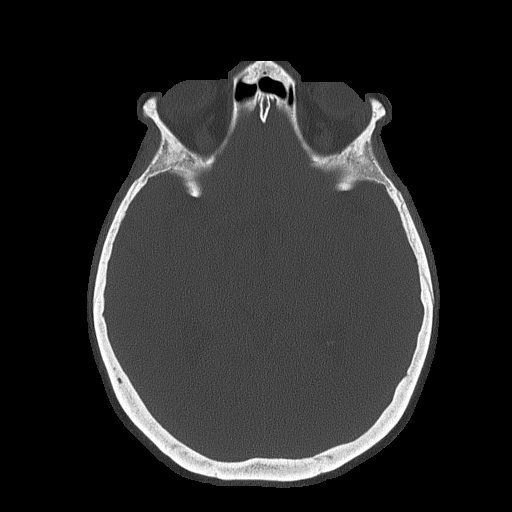
[im 49/86  bone]
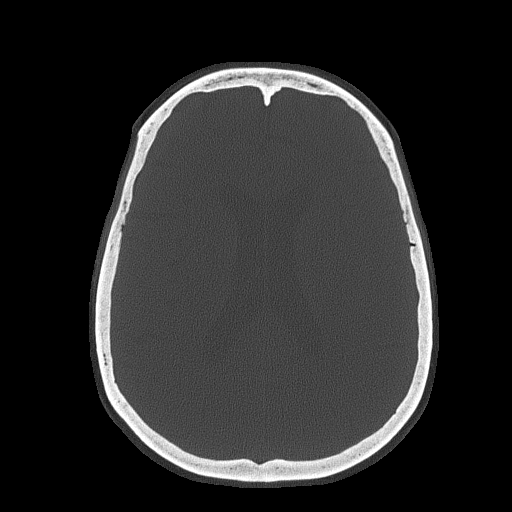

[Series 5: head without cor · coronal · non-contrast · 0.30mm/px · 3 of 67 slices shown]
[im 23/67  brain]
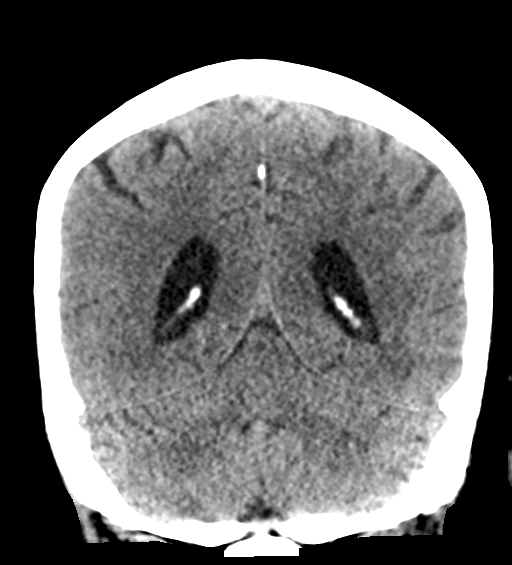
[im 30/67  brain]
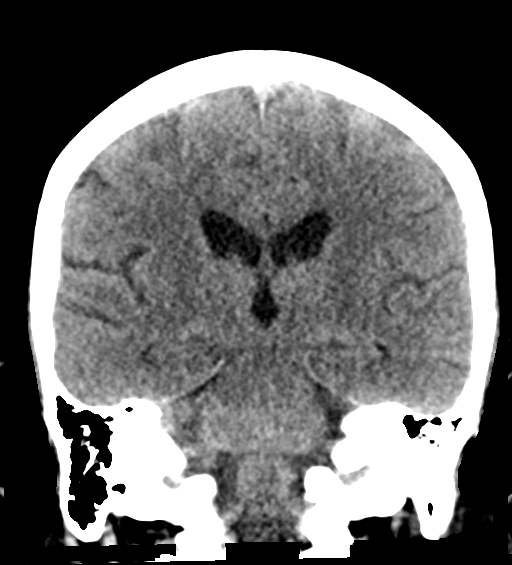
[im 37/67  brain]
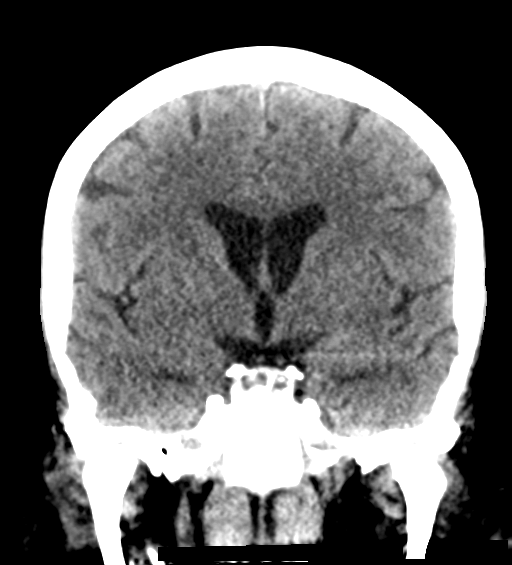

[Series 6: head without sag · sagittal · non-contrast · 0.34mm/px · 3 of 66 slices shown]
[im 22/66  brain]
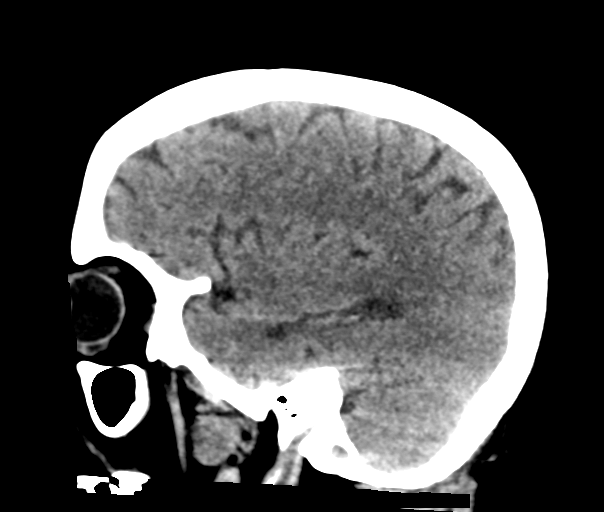
[im 33/66  brain]
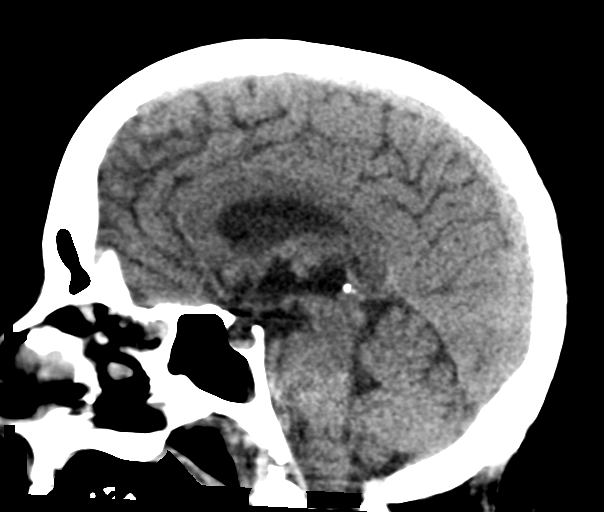
[im 44/66  brain]
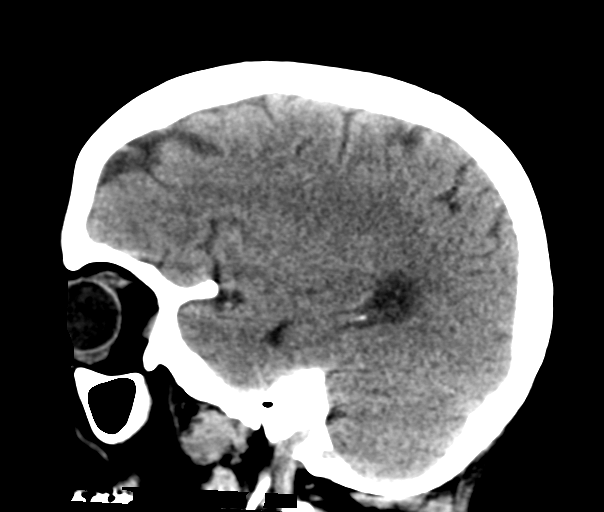

[17 of 47 positions shown; findings below may reference images not displayed]

FINDINGS: Brain: No evidence of acute infarction, hemorrhage, hydrocephalus,
extra-axial collection or mass lesion/mass effect.

Vascular: No hyperdense vessel or unexpected calcification.

Skull: Normal. Negative for fracture or focal lesion.

Sinuses/Orbits: Globes and orbits are unremarkable. There is a small
amount of dependent fluid in the right sphenoid sinus. Sinuses
otherwise clear. Clear mastoid air cells.

Other: None.
IMPRESSION: No intracranial abnormality.

## 2018-09-23 IMAGING — DX DG CHEST 1V PORT
1 series · 1 of 1 positions shown · non-contrast
Comparison: 08/31/2017

CLINICAL DATA: Lethargy, dyspnea

EXAM:
PORTABLE CHEST 1 VIEW

[chest]
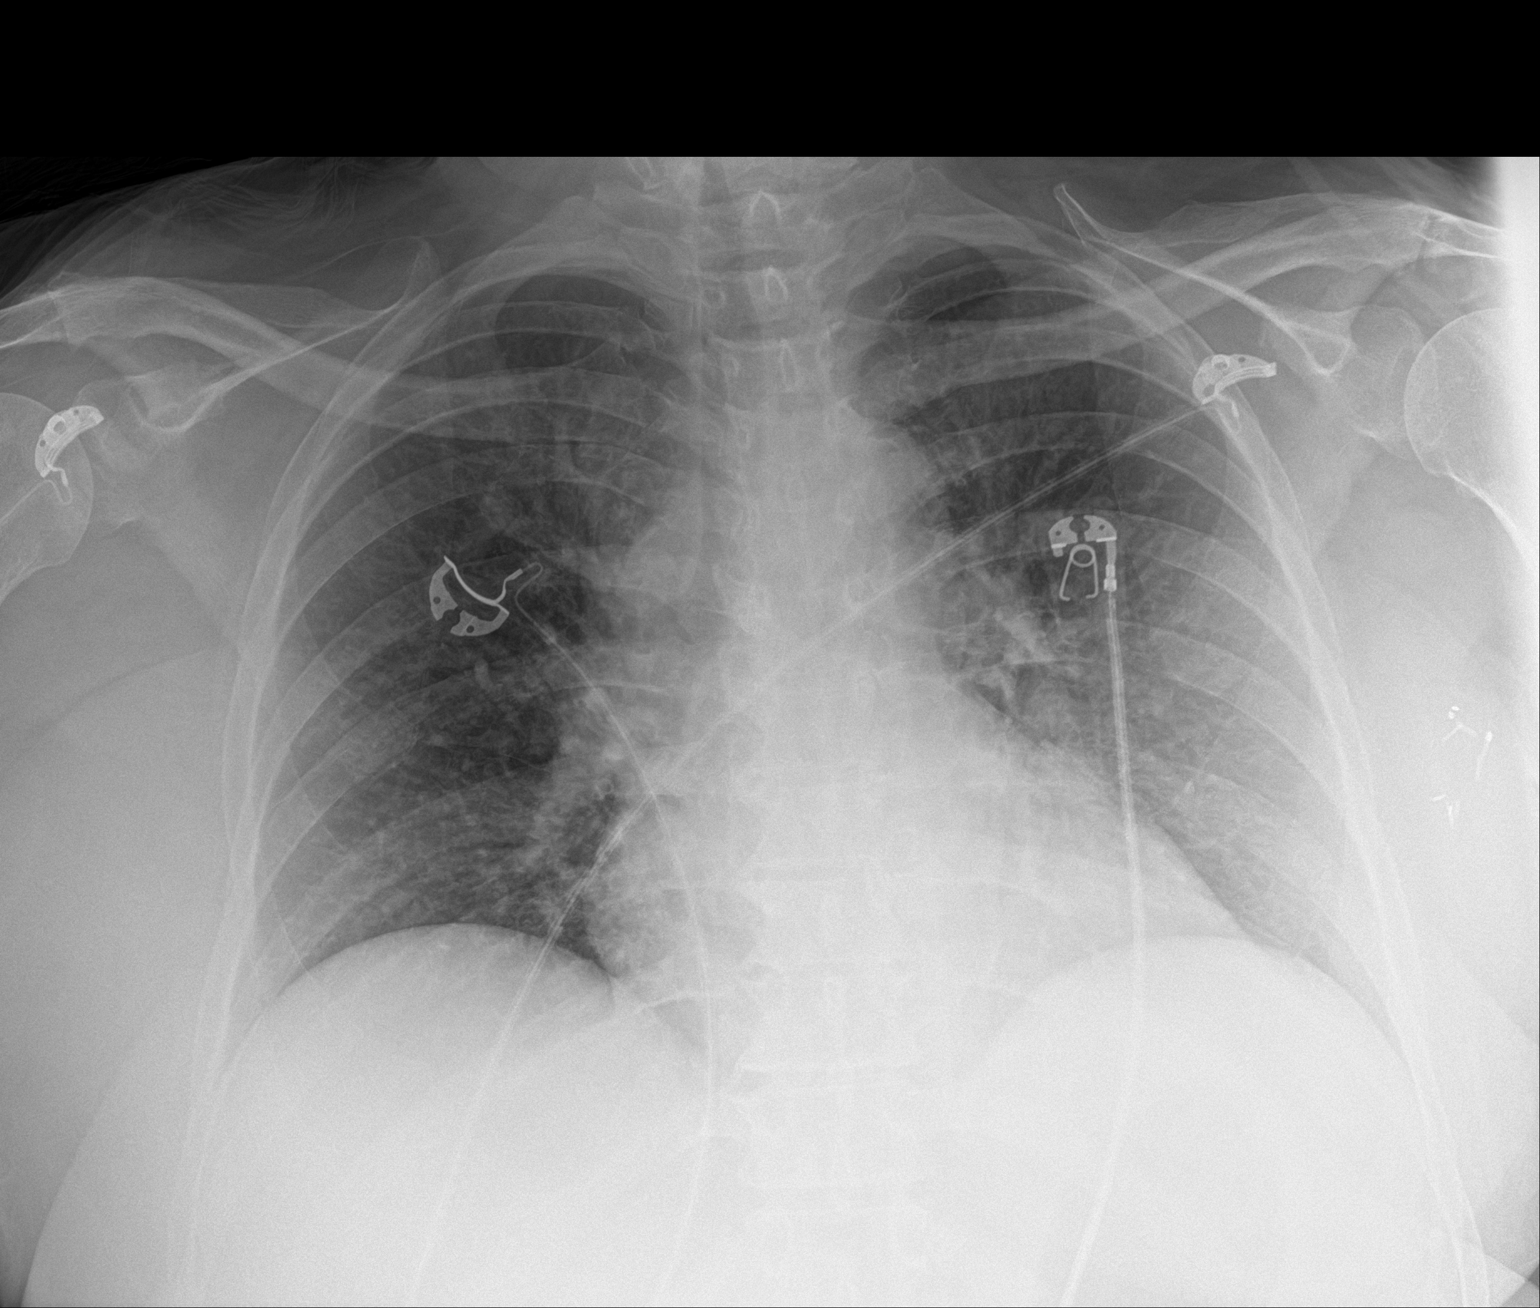

[1 of 1 positions shown; findings below may reference images not displayed]

FINDINGS: Lungs are clear.  No pleural effusion or pneumothorax.

The heart is normal in size.
IMPRESSION: No evidence of acute cardiopulmonary disease.

## 2018-09-23 IMAGING — XA IR EXCHANGE NEPHROSTOMY RIGHT
2 series · 9 of 9 positions shown · non-contrast
Comparison: CT abdomen and pelvis - 10/02/2017;

INDICATION: History of endometrial cancer complicated by development of
bilateral ureteral obstruction, post bilateral percutaneous
nephrostomy catheter placement on 09/02/2017.
TECHNIQUE: Informed written consent was obtained from the the patient's family
after a discussion of the risks, benefits and alternatives to
treatment. Questions regarding the procedure were encouraged and
answered. A timeout was performed prior to the initiation of the
procedure.

[Series 100: dsa body · 1 of 1 slices shown (1 of 2)]
[im 1/1]
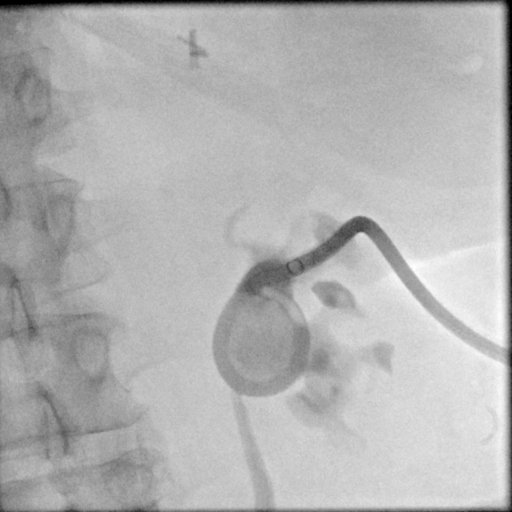

[Series 300: dsa body · 8 of 8 slices shown (2 of 2)]
[im 1/8]
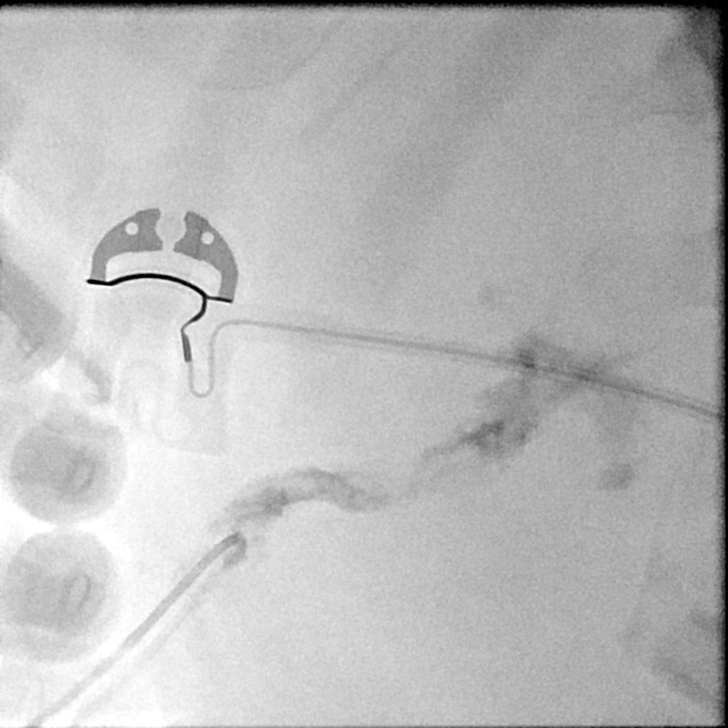
[im 2/8]
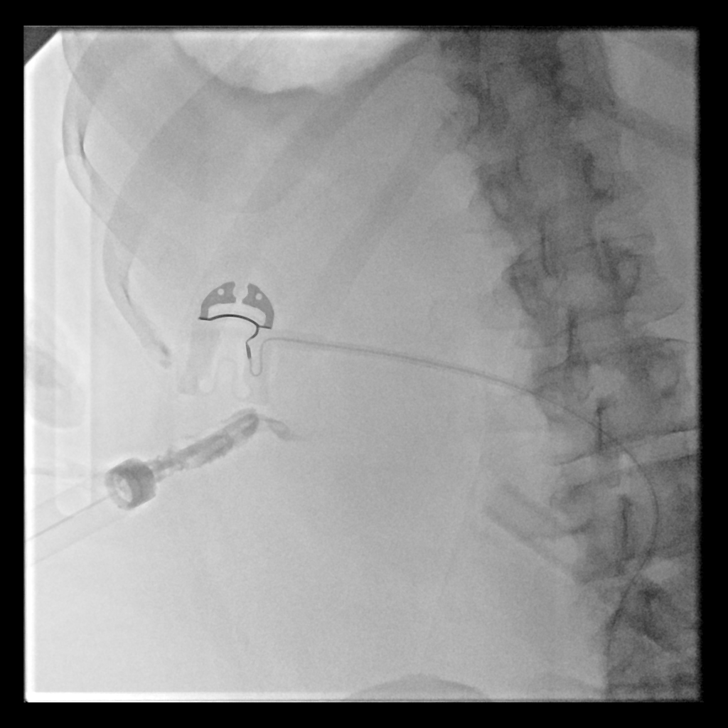
[im 3/8]
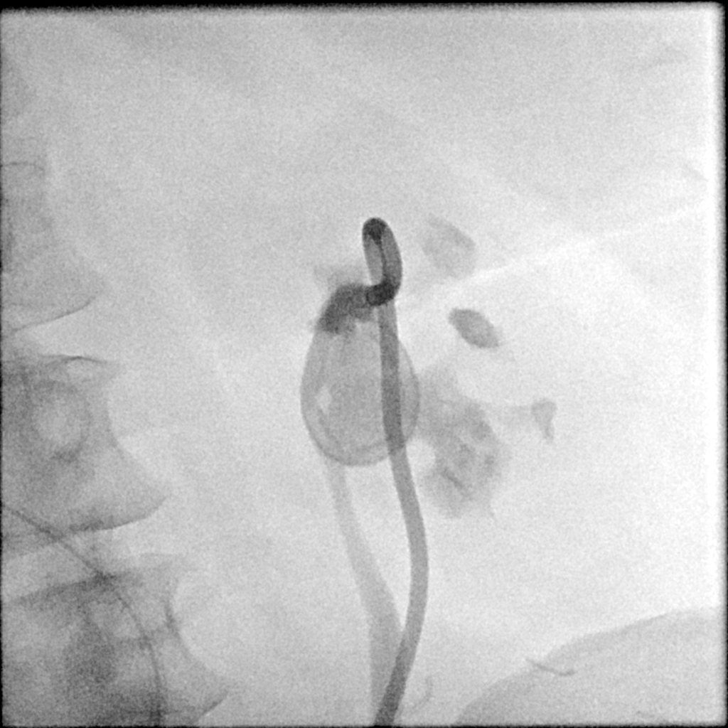
[im 4/8]
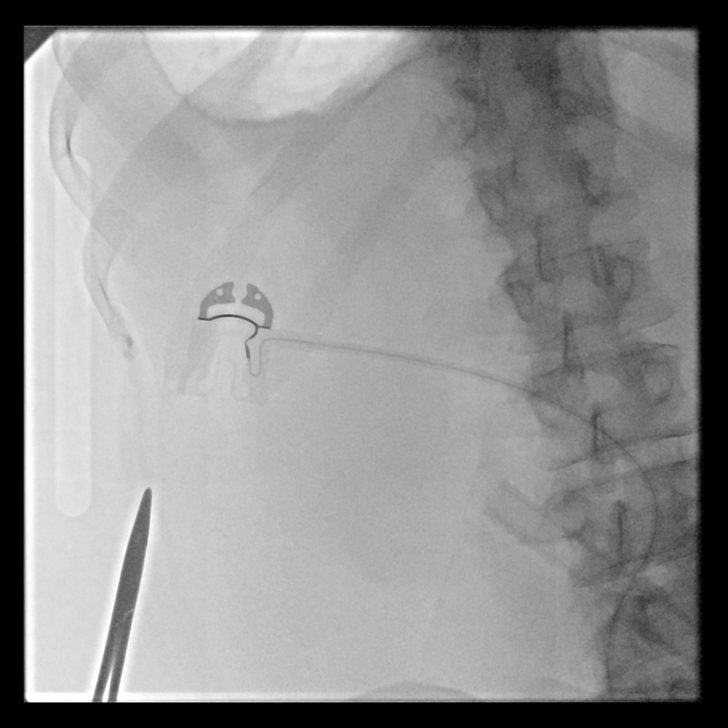
[im 5/8]
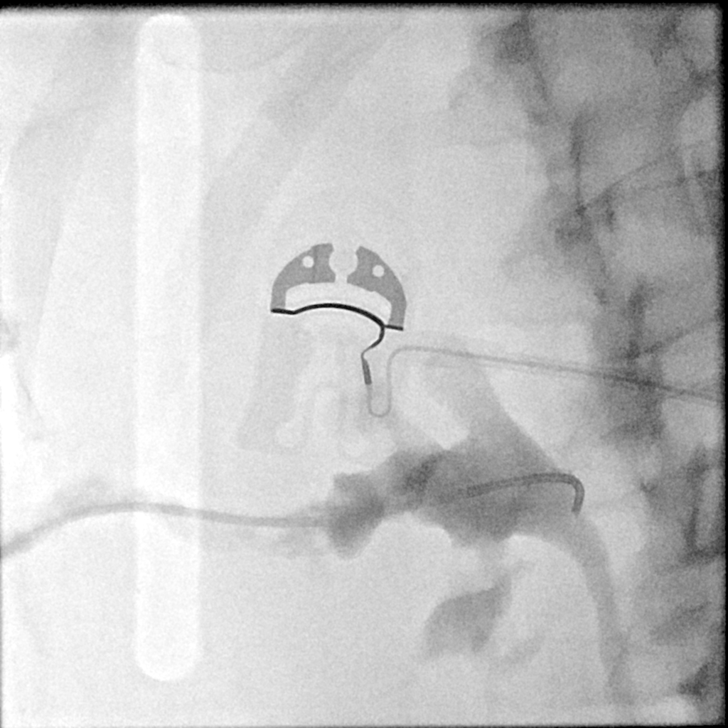
[im 6/8]
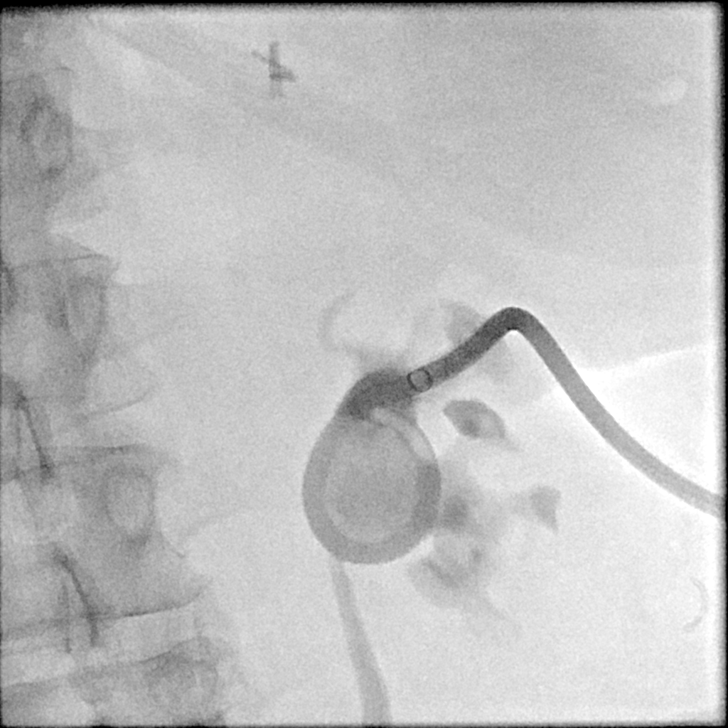
[im 7/8]
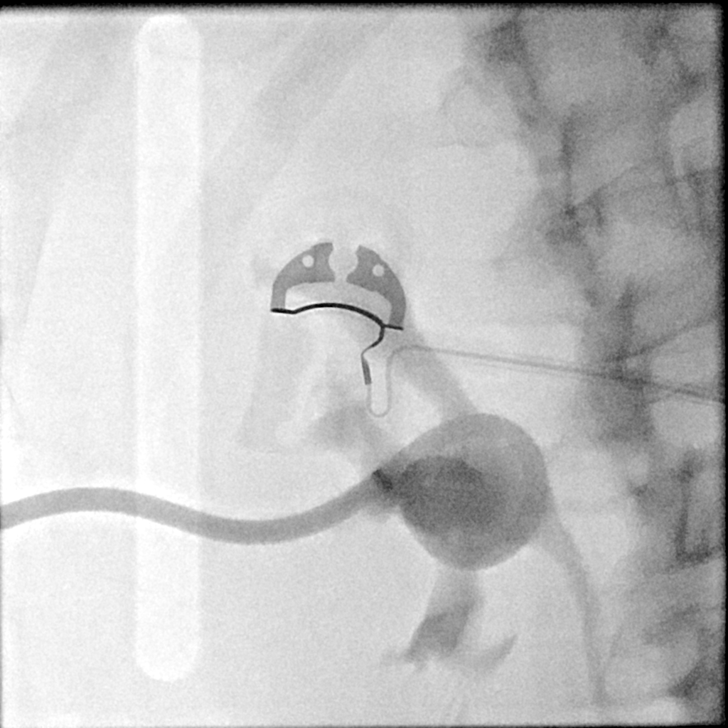
[im 8/8]
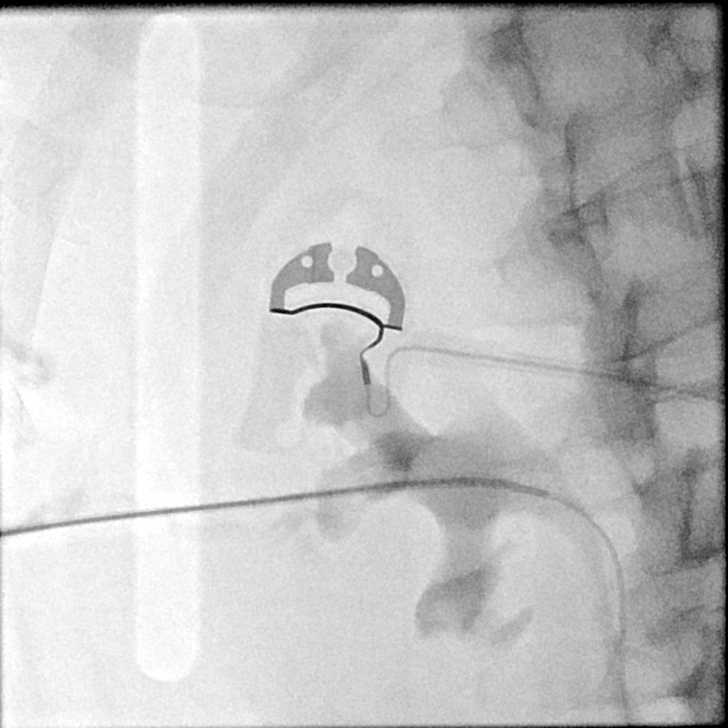

[9 of 9 positions shown; findings below may reference images not displayed]

Unfortunately, the patient has now been admitted with interval
removal of left-sided nephrostomy catheter, as well as findings
worrisome for impending urosepsis with markedly cloudy urine within
the right nephrostomy.

As such, request made for image guided replacement of left-sided
nephrostomy catheter as well as right-sided nephrostomy catheter
exchange and up sizing.

EXAM:
1. FLUOROSCOPIC GUIDED REPLACEMENT OF LEFT-SIDED NEPHROSTOMY
CATHETER
2. FLUOROSCOPIC GUIDED EXCHANGE OF RIGHT-SIDED NEPHROSTOMY CATHETER
EXCHANGE
09/22/2017;
ultrasound and fluoroscopic guided bilateral percutaneous
nephrostomy catheter placement - 09/02/2017

CONTRAST:  A total of 20 mL Psovue-EPP administered was administered
into both collecting systems

FLUOROSCOPY TIME:  4 minute 18 seconds (88 mGy)

COMPLICATIONS:
None immediate.
The bilateral flanks and external portions of the existing
right-sided percutaneous nephrostomy catheter and entrance site of
inadvertently removed left-sided nephrostomy catheter were prepped
and draped in the usual sterile fashion. A sterile drape was applied
covering the operative field. Maximum barrier sterile technique with
sterile gowns and gloves were used for the procedure. A timeout was
performed prior to the initiation of the procedure.

A pre procedural spot fluoroscopic image was obtained.

The entrance site of the inadvertently removed left-sided
nephrostomy catheter was cannulated with [REDACTED] tree adapter.
Contrast injection was performed. With the use of a regular
glidewire, a Kumpe catheter was advanced through the subcutaneous
tract to the level of the left renal pelvis. Contrast injection
confirmed appropriate positioning.

Next, over a short Amplatz wire, a new 12 French percutaneous
drainage catheter was advanced through the existing nephrostomy
catheter track with end ultimately coiled and locked within the left
renal pelvis. Contrast injection confirmed appropriate positioning.
A sample of urine was aspirated, capped and sent to the laboratory
for analysis.

Attention was now paid towards the exchange and up sizing of the
right-sided nephrostomy catheter.

Contrast injection confirmed appropriate positioning and
functionality of existing right-sided nephrostomy catheter. A small
amount of urine was aspirated from the existing right-sided
nephrostomy catheter, capped and sent to the laboratory for
analysis.

Next, the external portion of the nephrostomy catheter was cut and
cannulated with a short Amplatz wire which was coiled within the
right renal pelvis. Under intermittent fluoroscopic guidance, the
existing 10 French nephrostomy catheter was exchanged for a new,
slightly larger now 12 French nephrostomy catheter with end
ultimately coiled and locked within the right renal pelvis. Contrast
injection confirmed appropriate positioning.

Both nephrostomy catheters were secured at the skin entrance site
with 2 interrupted sutures and StatLock devices. Both nephrostomy
catheters were reconnected to gravity bags. Dressings were placed.

The patient tolerated the above procedures well without immediate
postprocedural complication.
FINDINGS: Subcutaneous tract injection demonstrated persistent patency of the
left-sided percutaneous nephrostomy catheter tract.

Successful fluoroscopic guided recanalization of the left-sided
nephrostomy catheter track allowing placement of a new 12 French
percutaneous drainage catheter with end coiled end locked within the
left renal pelvis.

Successful fluoroscopic guided exchange and up sizing of new now 12
French right-sided percutaneous nephrostomy catheter with end coiled
and locked within the right renal pelvis.
IMPRESSION: 1. Successful fluoroscopic guided recanalization of the left-sided
nephrostomy catheter track allowing placement of a new 12 French
percutaneous drainage catheter with and coiled end locked within the
left renal pelvis.
2. Successful fluoroscopic guided exchange and up sizing of new now
12 French right-sided percutaneous nephrostomy catheter with end
coiled and locked within the right renal pelvis.
3. Urine sample capped and sent to the laboratory for analysis.

## 2018-09-24 IMAGING — DX DG CHEST 1V PORT
1 series · 1 of 1 positions shown · non-contrast
Comparison: 10/02/2017

CLINICAL DATA: Central line placement.

EXAM:
PORTABLE CHEST 1 VIEW

[chest ap]
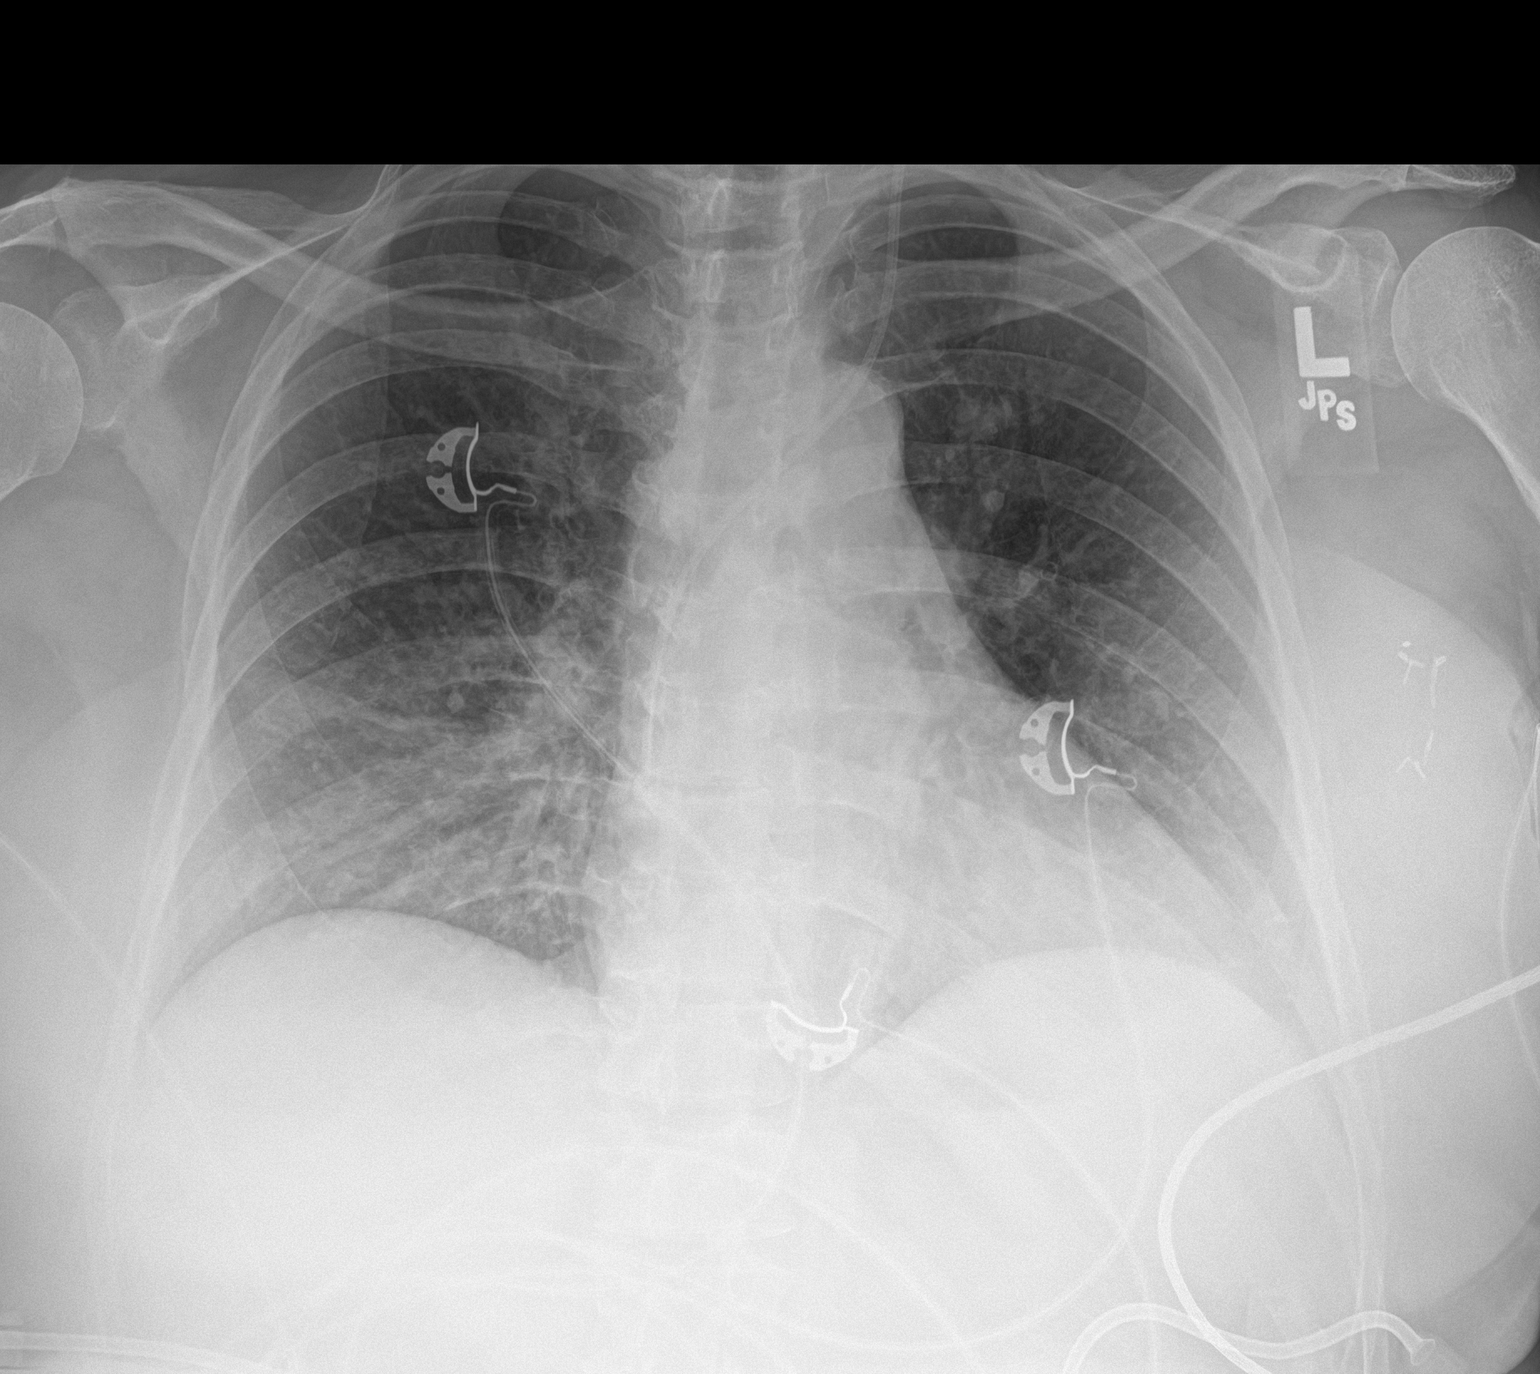

[1 of 1 positions shown; findings below may reference images not displayed]

FINDINGS: Left internal jugular central venous line has its tip projecting at
the caval atrial junction. No pneumothorax.

Cardiac silhouette is normal in size. No mediastinal or hilar
masses. Clear lungs.
IMPRESSION: 1. Left internal jugular central venous line is well positioned, tip
projecting at the caval atrial junction. No pneumothorax.
2. No acute cardiopulmonary disease.

## 2018-09-27 DIAGNOSIS — B9562 Methicillin resistant Staphylococcus aureus infection as the cause of diseases classified elsewhere: Secondary | ICD-10-CM | POA: Diagnosis not present

## 2018-09-27 DIAGNOSIS — T82594D Other mechanical complication of infusion catheter, subsequent encounter: Secondary | ICD-10-CM | POA: Diagnosis not present

## 2018-09-27 DIAGNOSIS — L89312 Pressure ulcer of right buttock, stage 2: Secondary | ICD-10-CM | POA: Diagnosis not present

## 2018-09-27 DIAGNOSIS — Z923 Personal history of irradiation: Secondary | ICD-10-CM | POA: Diagnosis not present

## 2018-09-27 DIAGNOSIS — D631 Anemia in chronic kidney disease: Secondary | ICD-10-CM | POA: Diagnosis not present

## 2018-09-27 DIAGNOSIS — E860 Dehydration: Secondary | ICD-10-CM | POA: Diagnosis not present

## 2018-09-27 DIAGNOSIS — N182 Chronic kidney disease, stage 2 (mild): Secondary | ICD-10-CM | POA: Diagnosis not present

## 2018-09-27 DIAGNOSIS — Z79899 Other long term (current) drug therapy: Secondary | ICD-10-CM | POA: Diagnosis not present

## 2018-09-27 DIAGNOSIS — Z8542 Personal history of malignant neoplasm of other parts of uterus: Secondary | ICD-10-CM | POA: Diagnosis not present

## 2018-09-27 DIAGNOSIS — D709 Neutropenia, unspecified: Secondary | ICD-10-CM | POA: Diagnosis not present

## 2018-09-27 DIAGNOSIS — C50212 Malignant neoplasm of upper-inner quadrant of left female breast: Secondary | ICD-10-CM | POA: Diagnosis not present

## 2018-09-27 DIAGNOSIS — Z452 Encounter for adjustment and management of vascular access device: Secondary | ICD-10-CM | POA: Diagnosis not present

## 2018-09-27 DIAGNOSIS — N179 Acute kidney failure, unspecified: Secondary | ICD-10-CM | POA: Diagnosis not present

## 2018-09-27 DIAGNOSIS — Z79811 Long term (current) use of aromatase inhibitors: Secondary | ICD-10-CM | POA: Diagnosis not present

## 2018-09-27 DIAGNOSIS — E1122 Type 2 diabetes mellitus with diabetic chronic kidney disease: Secondary | ICD-10-CM | POA: Diagnosis not present

## 2018-09-27 DIAGNOSIS — Z8543 Personal history of malignant neoplasm of ovary: Secondary | ICD-10-CM | POA: Diagnosis not present

## 2018-09-27 DIAGNOSIS — T83592A Infection and inflammatory reaction due to indwelling ureteral stent, initial encounter: Secondary | ICD-10-CM | POA: Diagnosis not present

## 2018-09-27 DIAGNOSIS — N183 Chronic kidney disease, stage 3 (moderate): Secondary | ICD-10-CM | POA: Diagnosis not present

## 2018-09-27 DIAGNOSIS — C541 Malignant neoplasm of endometrium: Secondary | ICD-10-CM | POA: Diagnosis not present

## 2018-09-27 DIAGNOSIS — L89153 Pressure ulcer of sacral region, stage 3: Secondary | ICD-10-CM | POA: Diagnosis not present

## 2018-09-27 DIAGNOSIS — L89311 Pressure ulcer of right buttock, stage 1: Secondary | ICD-10-CM | POA: Diagnosis not present

## 2018-09-27 DIAGNOSIS — Z9221 Personal history of antineoplastic chemotherapy: Secondary | ICD-10-CM | POA: Diagnosis not present

## 2018-09-28 ENCOUNTER — Encounter: Payer: Self-pay | Admitting: Hematology and Oncology

## 2018-09-28 ENCOUNTER — Other Ambulatory Visit: Payer: Self-pay | Admitting: Hematology and Oncology

## 2018-09-28 ENCOUNTER — Inpatient Hospital Stay: Payer: BLUE CROSS/BLUE SHIELD | Attending: Gynecologic Oncology | Admitting: Hematology and Oncology

## 2018-09-28 ENCOUNTER — Telehealth: Payer: Self-pay | Admitting: Hematology and Oncology

## 2018-09-28 ENCOUNTER — Inpatient Hospital Stay: Payer: BLUE CROSS/BLUE SHIELD

## 2018-09-28 DIAGNOSIS — Z8543 Personal history of malignant neoplasm of ovary: Secondary | ICD-10-CM | POA: Diagnosis not present

## 2018-09-28 DIAGNOSIS — C569 Malignant neoplasm of unspecified ovary: Secondary | ICD-10-CM

## 2018-09-28 DIAGNOSIS — C50212 Malignant neoplasm of upper-inner quadrant of left female breast: Secondary | ICD-10-CM | POA: Diagnosis not present

## 2018-09-28 DIAGNOSIS — Z9221 Personal history of antineoplastic chemotherapy: Secondary | ICD-10-CM | POA: Diagnosis not present

## 2018-09-28 DIAGNOSIS — N183 Chronic kidney disease, stage 3 unspecified: Secondary | ICD-10-CM

## 2018-09-28 DIAGNOSIS — Z79899 Other long term (current) drug therapy: Secondary | ICD-10-CM | POA: Diagnosis not present

## 2018-09-28 DIAGNOSIS — Z923 Personal history of irradiation: Secondary | ICD-10-CM

## 2018-09-28 DIAGNOSIS — Z79811 Long term (current) use of aromatase inhibitors: Secondary | ICD-10-CM | POA: Diagnosis not present

## 2018-09-28 DIAGNOSIS — C55 Malignant neoplasm of uterus, part unspecified: Secondary | ICD-10-CM

## 2018-09-28 DIAGNOSIS — Z8542 Personal history of malignant neoplasm of other parts of uterus: Secondary | ICD-10-CM | POA: Diagnosis not present

## 2018-09-28 DIAGNOSIS — D709 Neutropenia, unspecified: Secondary | ICD-10-CM

## 2018-09-28 LAB — CBC WITH DIFFERENTIAL/PLATELET
Abs Immature Granulocytes: 0.01 10*3/uL (ref 0.00–0.07)
BASOS ABS: 0 10*3/uL (ref 0.0–0.1)
Basophils Relative: 2 %
Eosinophils Absolute: 0.1 10*3/uL (ref 0.0–0.5)
Eosinophils Relative: 3 %
HCT: 36.6 % (ref 36.0–46.0)
Hemoglobin: 11.4 g/dL — ABNORMAL LOW (ref 12.0–15.0)
Immature Granulocytes: 1 %
Lymphocytes Relative: 27 %
Lymphs Abs: 0.5 10*3/uL — ABNORMAL LOW (ref 0.7–4.0)
MCH: 29.6 pg (ref 26.0–34.0)
MCHC: 31.1 g/dL (ref 30.0–36.0)
MCV: 95.1 fL (ref 80.0–100.0)
Monocytes Absolute: 0.8 10*3/uL (ref 0.1–1.0)
Monocytes Relative: 39 %
Neutro Abs: 0.6 10*3/uL — ABNORMAL LOW (ref 1.7–7.7)
Neutrophils Relative %: 28 %
PLATELETS: 206 10*3/uL (ref 150–400)
RBC: 3.85 MIL/uL — AB (ref 3.87–5.11)
RDW: 14 % (ref 11.5–15.5)
WBC: 2 10*3/uL — AB (ref 4.0–10.5)
nRBC: 0 % (ref 0.0–0.2)

## 2018-09-28 LAB — COMPREHENSIVE METABOLIC PANEL
ALT: 29 U/L (ref 0–44)
ANION GAP: 12 (ref 5–15)
AST: 16 U/L (ref 15–41)
Albumin: 3.2 g/dL — ABNORMAL LOW (ref 3.5–5.0)
Alkaline Phosphatase: 284 U/L — ABNORMAL HIGH (ref 38–126)
BUN: 29 mg/dL — ABNORMAL HIGH (ref 8–23)
CO2: 22 mmol/L (ref 22–32)
Calcium: 9.7 mg/dL (ref 8.9–10.3)
Chloride: 104 mmol/L (ref 98–111)
Creatinine, Ser: 1.86 mg/dL — ABNORMAL HIGH (ref 0.44–1.00)
GFR calc Af Amer: 33 mL/min — ABNORMAL LOW (ref >60)
GFR calc non Af Amer: 28 mL/min — ABNORMAL LOW (ref >60)
Glucose, Bld: 107 mg/dL — ABNORMAL HIGH (ref 70–99)
Potassium: 4 mmol/L (ref 3.5–5.1)
Sodium: 138 mmol/L (ref 135–145)
Total Bilirubin: 0.4 mg/dL (ref 0.3–1.2)
Total Protein: 8.6 g/dL — ABNORMAL HIGH (ref 6.5–8.1)

## 2018-09-28 NOTE — Assessment & Plan Note (Signed)
Her CBC today is stable although she is mildly neutropenic I recommend frequent injection every 5-6 days.  For future surgery, due to her history of recurrent infection, I recommend we titrate the dosage of Neupogen to keep a consistently greater than 1.5 in the perioperative setting.

## 2018-09-28 NOTE — Assessment & Plan Note (Signed)
Her examination reveals some mild scarring and implant in situ She will continue Arimidex for minimum 5 years So far, she tolerated treatment well.  I will see her once a year

## 2018-09-28 NOTE — Progress Notes (Signed)
[place] OFFICE PROGRESS NOTE  Patient Care Team: Carollee Herter, Alferd Apa, DO as PCP - General (Family Medicine) Heath Lark, MD as Consulting Physician (Hematology and Oncology) Everitt Amber, MD as Consulting Physician (Obstetrics and Gynecology) Michael [name], MD as Consulting Physician (General Surgery) Alexis Frock, MD as Consulting Physician (Urology) Altheimer, Legrand Como, MD as Consulting Physician (Endocrinology) Katy Apo, MD as Consulting Physician (Ophthalmology) Irene Limbo, MD as Consulting Physician (Plastic Surgery) Madelon Lips, MD as Consulting Physician (Nephrology)  ASSESSMENT & PLAN:  Breast cancer of upper-inner quadrant of left female breast [facility]) Her examination reveals some mild scarring and implant in situ She will continue Arimidex for minimum 5 years So far, she tolerated treatment well.  I will see her once a year  Chronic neutropenia (HCC) Her CBC today is stable although she is mildly neutropenic I recommend frequent injection every 5-6 days.  For future surgery, due to her history of recurrent infection, I recommend we titrate the dosage of Neupogen to keep a consistently greater than 1.5 in the perioperative setting.  CKD (chronic kidney disease), stage III ([place]) She will continue risk factor modification We discussed the importance of dietary modification to get her diabetes under control and adequate fluid intake to prevent dehydration  History of ovarian & endometrial cancer Her last imaging study was done in 2019 which showed no evidence of residual disease The patient had significant postop complications with recurrent infection, renal failure and chronic pain with persistent stents and drainage placed. She has multiple appointment pending with general surgery for evaluation At this point in time, there is no indication for her to get surveillance imaging study as the patient had multiple scans done over the past 2  years   No orders of the defined types were placed in this encounter.   INTERVAL HISTORY: Please see below for problem oriented charting. She returns for further follow-up She continues to battle with postoperative complication requiring nephrostomy tube, Foley catheter and recurrent infection She is currently on chronic suppressive antibiotic therapy She is attempting weight loss so that she can proceed with surgical repair and reconstruction surgery to her bladder and her intestinal tract She denies any recent abnormal breast examination, palpable mass, abnormal breast appearance or nipple changes. She has bilateral implants in situ She denies significant hot flashes She has no problem getting Neupogen/Granix refill  SUMMARY OF ONCOLOGIC HISTORY: Oncology History   Breast cancer of upper-inner quadrant of left female breast ([place])   Staging form: Breast, AJCC 7th Edition     Clinical stage from 12/31/2015: Stage IIA (T2, N0, M0) - Signed by Heath Lark, MD on 12/31/2015  ER,PR positive Her2 neg     History of ovarian & endometrial cancer   10/15/2004 Initial Diagnosis    History of ovarian cancer, treated with chemotherapy carbo/Taxol    03/17/2009 Bone Marrow Biopsy    Bone marrow biopsy at [facility] showed neutropenia    01/23/2014 Bone Marrow Biopsy    Repeat bone marrow biopsy showed neutropenia    09/12/2014 Imaging    CT scan showed possible cancer    10/14/2014 Imaging    MRI show significant pelvic mass without invasion into the bladder but abutting to the rectum    11/19/2014 Surgery    she underwent surgery and had robotic-assisted lysis of adhesions, converted to laparotomy, radical upper vaginectomy and low anterior resection with colostomy. 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 Resection margins were negative    01/29/2015 - 03/10/2015 Radiation Therapy    She received adjuvant radiation    03/03/2015 Imaging    CT scan of the abdomen and pelvis showed status post interval removal of the bilateral nephroureteral stents. Worsening moderate right hydroureteronephrosis. Resolved left hydroureteronephrosis.    03/20/2015 Procedure    she had cystoscopy and stent placement for right hydronephrosis    05/02/2015 Surgery    Cystoscopy with right retrograde pyelogram interpretation. Diagnostic ureteroscopy. Removal of right ureteral stent.    05/14/2015 Imaging    CT scan of the abdomen and pelvis show unilateral right hydronephrosis with no residual cancer    08/18/2015 Imaging    CT scan showed hysterectomy with stable presacral soft tissue thickening. No definitive evidence of recurrent or metastatic disease. Severe right hydronephrosis    09/12/2015 Surgery    Cystoscopy with right retrograde pyelogram interpretation. Right ureteral stent placement 5 x 24 Polaris, no tether    10/07/2016 Imaging    Stable exam.  No new or progressive findings. 2. Stable appearance abnormal presacral soft tissue compatible with post treatment change. 3. Internal right ureteral stent with persistent right hydroureteronephrosis. Differentially decreased perfusion of the right kidney suggests a component of underlying obstructive uropathy. 4. Stable 13 mm left adrenal nodule, previously characterized as adenoma.      01/27/2017 Imaging    Stable postop and post radiation changes in presacral region. No evidence of recurrent or metastatic carcinoma within the chest, abdomen, or pelvis. Stable mild to moderate right hydroureteronephrosis, with right ureteral stent in appropriate position. Stable small benign left adrenal adenoma and left thyroid lobe nodule.     Breast cancer of upper-inner quadrant of left female breast ([place])   10/14/2015 Imaging    Screening mammogram showed  possible distortion in the left breast.    10/24/2015 Imaging    Targeted ultrasound is performed, showing a 0.6 x 0.8 x 0.9 cm area of hypoechoic distortion at the 10 o'clock position of the left breast 5 cm from the nipple    10/24/2015 Imaging    Diagnostic imaging confirmed 0.6 x 0.8 x 0.9 cm distortion in the upper inner left breast    10/30/2015 Procedure    Left US guided biopsy was performed    10/30/2015 Pathology Results    Accession: [id number] showed invasive ductal carcinoma, ER/PR positive, Her2 neg    11/05/2015 Genetic Testing    Genetic testing did not reveal a deleterious mutation.  Genetic testing did detect a Variant of Unknown Significance in the MSH6 gene called [id number]..  Genes tested include: APC, ATM, AXIN2, BARD1, BMPR1A, BRCA1, BRCA2, BRIP1, CDH1, CDKN2A, CHEK2, DICER1, EPCAM, GREM1, KIT, MEN1, MLH1, MSH2, MSH6, MUTYH, NBN, NF1, PALB2, PDGFRA, PMS2, POLD1, POLE, PTEN, RAD50, RAD51C, RAD51D, SDHA, SDHB, SDHC, SDHD, SMAD4, SMARCA4. STK11, TP53, TSC1, TSC2, and VHL.    12/11/2015 Surgery    She undwerwent bilateral mastectomy, left SLN biopsy and immediate reconstruction surgeries with bilateral plasma expanders    12/11/2015 Pathology Results    Accession: [id number] mastectomy specimens showed left breast ca, [id number]    12/26/2015 Pathology Results    Accession: [id number] specimen from debridement showed no cancer    12/26/2015 Surgery    she underwent surgical debridement of necrotic tissue at site of plasma expander    01/12/2016 Imaging    Bone density is normal    02/02/2016 -  Anti-estrogen oral therapy    She started taking Arimidex    10/21/2017 Procedure    Ultrasound guided biopsy of a vague hypoechoic shadowing lesion, generally corresponding to area palpable abnormality, along chest lateral to the reconstructed left breast and inferior to the left axilla. No apparent complications.    01/17/2018 Imaging    Bone density scan is normal     REVIEW OF  SYSTEMS:   Constitutional: Denies fevers, chills or abnormal weight loss Eyes: Denies blurriness of vision Ears, nose, mouth, throat, and face: Denies mucositis or sore throat Respiratory: Denies cough, dyspnea or wheezes Cardiovascular: Denies palpitation, chest discomfort or lower extremity swelling Gastrointestinal:  Denies nausea, heartburn or change in bowel habits Skin: Denies abnormal skin rashes Lymphatics: Denies new lymphadenopathy or easy bruising Neurological:Denies numbness, tingling or new weaknesses Behavioral/Psych: Mood is stable, no new changes  All other systems were reviewed with the patient and are negative.  I have reviewed the past medical history, past surgical history, social history and family history with the patient and they are unchanged from previous note.  ALLERGIES:  is allergic to penicillins; cefaclor; erythromycin; tape; trimethoprim; ultram [tramadol]; cephalosporins; fluconazole; oxycodone; pectin; septra [sulfamethoxazole-trimethoprim]; and sulfa antibiotics.  MEDICATIONS:  Current Outpatient Medications  Medication Sig Dispense Refill  . acetaminophen (TYLENOL) 325 MG tablet Take 2 tablets (650 mg total) by mouth every 6 (six) hours as needed. 30 tablet 1  . anastrozole (ARIMIDEX) 1 MG tablet TAKE 1 TABLET DAILY (Patient taking differently: Take 1 mg by mouth daily. ) 90 tablet 3  . Biotin 5 MG TABS Take 5 mg by mouth every morning.     [name] Kitchen buPROPion (WELLBUTRIN SR) 150 MG 12 hr tablet Take 1 tablet (150 mg total) by mouth daily. 30 tablet 0  . Cholecalciferol (VITAMIN D3) 10000 UNITS capsule Take 10,000 Units by mouth once a week. Sunday evening's    . clobetasol (OLUX) 0.05 % topical foam Apply topically 2 (two) times daily.    . diphenhydrAMINE (BENADRYL) 25 mg capsule Take 1 capsule (25 mg total) by mouth every 8 (eight) hours as needed for itching, allergies or sleep. 30 capsule 0  . diphenoxylate-atropine (LOMOTIL) 2.5-0.025 MG tablet Take 1  tablet by mouth 4 (four) times daily as needed for diarrhea or loose stools. TO PREVENT LOOSE BOWEL MOVEMENTS (Patient taking differently: Take 1 tablet by mouth every other day. TO PREVENT LOOSE BOWEL MOVEMENTS) 30 tablet 0  . fentaNYL (DURAGESIC) 50 MCG/HR Place 1 patch onto the skin every 3 (three) days. 10 patch 0  . ferrous sulfate 325 (65 FE) MG tablet Take 1 tablet (325 mg total) by mouth at bedtime. 30 tablet 3  . HYDROcodone-acetaminophen (NORCO/VICODIN) 5-325 MG tablet Take 1 tablet by mouth every 6 (six) hours as needed for moderate pain. 30 tablet 0  . JANUVIA 50 MG tablet Take 50 mg by mouth daily.     [name] Kitchen levothyroxine (SYNTHROID, LEVOTHROID) 150 MCG tablet Take 1 tablet (150 mcg total) by mouth daily before breakfast. 30 tablet 1  . loratadine (CLARITIN) 10 MG tablet Take 10 mg by mouth every morning.     . nitrofurantoin, macrocrystal-monohydrate, (MACROBID) 100 MG capsule Take 100 mg by mouth at bedtime.    [name] Kitchen omega-3 acid ethyl esters (LOVAZA) 1 G capsule Take 1 g by mouth 2 (two) times daily.     Vladimir Faster Glycol-Propyl Glycol (SYSTANE OP) Place 1 drop into both eyes daily as needed (dry eyes).     . pregabalin (LYRICA)  50 MG capsule TAKE 1 CAPSULE(50 MG) BY MOUTH TWICE DAILY 180 capsule 0  . Prenatal Vit-Fe Fumarate-FA (PRENATAL VITAMIN PO) Take 1 capsule by mouth daily. Takes prenatal because there are no dyes in it    . rosuvastatin (CRESTOR) 10 MG tablet Take 10 mg by mouth every evening.     . Saccharomyces boulardii (FLORASTOR PO) Take 1 capsule by mouth daily.    . sodium bicarbonate 650 MG tablet Take 650 mg by mouth 2 (two) times daily.    . sodium chloride 0.9 % injection Inject 10 mLs into the vein daily.    . Tbo-Filgrastim (GRANIX) 480 MCG/0.8ML SOSY injection Inject 0.8 mLs (480 mcg total) into the skin daily. (Patient taking differently: Inject 480 mcg into the skin See admin instructions. Every 6 days) 30 Syringe 11   No current facility-administered medications  for this visit.     PHYSICAL EXAMINATION: ECOG PERFORMANCE STATUS: 1 - Symptomatic but completely ambulatory  Vitals:   09/28/18 1517  BP: (!) 109/52  Pulse: 74  Resp: 18  Temp: 98.5 F (36.9 C)  SpO2: 100%   Filed Weights   09/28/18 1517  Weight: 198 lb 9.6 oz (90.1 kg)    GENERAL:alert, no distress and comfortable SKIN: skin color, texture, turgor are normal, no rashes or significant lesions EYES: normal, Conjunctiva are pink and non-injected, sclera clear OROPHARYNX:no exudate, no erythema and lips, buccal mucosa, and tongue normal  NECK: supple, thyroid normal size, non-tender, without nodularity LYMPH:  no palpable lymphadenopathy in the cervical, axillary or inguinal LUNGS: clear to auscultation and percussion with normal breathing effort HEART: regular rate & rhythm and no murmurs and no lower extremity edema ABDOMEN:abdomen soft, appliances in situ.  Soft, nontender Musculoskeletal:no cyanosis of digits and no clubbing  NEURO: alert & oriented x 3 with fluent speech, no focal motor/sensory deficits Well-healed bilateral mastectomy scars.  There is nodularity near the right nipple consistent with previous surgical scar  LABORATORY DATA:  I have reviewed the data as listed    Component Value Date/Time   NA 138 09/28/2018 1441   NA 141 01/24/2017 1228   K 4.0 09/28/2018 1441   K 4.6 01/24/2017 1228   CL 104 09/28/2018 1441   CO2 22 09/28/2018 1441   CO2 29 01/24/2017 1228   GLUCOSE 107 (H) 09/28/2018 1441   GLUCOSE 84 01/24/2017 1228   BUN 29 (H) 09/28/2018 1441   BUN 22.2 01/24/2017 1228   CREATININE 1.86 (H) 09/28/2018 1441   CREATININE 1.98 (H) 03/27/2018 1324   CREATININE 0.8 01/24/2017 1228   CALCIUM 9.7 09/28/2018 1441   CALCIUM 10.2 01/24/2017 1228   PROT 8.6 (H) 09/28/2018 1441   PROT 6.9 01/24/2017 1228   ALBUMIN 3.2 (L) 09/28/2018 1441   ALBUMIN 3.4 (L) 01/24/2017 1228   AST 16 09/28/2018 1441   AST 14 (L) 03/27/2018 1324   AST 16 01/24/2017  1228   ALT 29 09/28/2018 1441   ALT 18 03/27/2018 1324   ALT 14 01/24/2017 1228   ALKPHOS 284 (H) 09/28/2018 1441   ALKPHOS 90 01/24/2017 1228   BILITOT 0.4 09/28/2018 1441   BILITOT 0.2 (L) 03/27/2018 1324   BILITOT 0.34 01/24/2017 1228   GFRNONAA 28 (L) 09/28/2018 1441   GFRNONAA 26 (L) 03/27/2018 1324   GFRNONAA 78 12/16/2014 1530   GFRAA 33 (L) 09/28/2018 1441   GFRAA 30 (L) 03/27/2018 1324   GFRAA >89 12/16/2014 1530    No results found for: SPEP, UPEP  Lab  Results  Component Value Date   WBC 2.0 (L) 09/28/2018   NEUTROABS 0.6 (L) 09/28/2018   HGB 11.4 (L) 09/28/2018   HCT 36.6 09/28/2018   MCV 95.1 09/28/2018   PLT 206 09/28/2018      Chemistry      Component Value Date/Time   NA 138 09/28/2018 1441   NA 141 01/24/2017 1228   K 4.0 09/28/2018 1441   K 4.6 01/24/2017 1228   CL 104 09/28/2018 1441   CO2 22 09/28/2018 1441   CO2 29 01/24/2017 1228   BUN 29 (H) 09/28/2018 1441   BUN 22.2 01/24/2017 1228   CREATININE 1.86 (H) 09/28/2018 1441   CREATININE 1.98 (H) 03/27/2018 1324   CREATININE 0.8 01/24/2017 1228      Component Value Date/Time   CALCIUM 9.7 09/28/2018 1441   CALCIUM 10.2 01/24/2017 1228   ALKPHOS 284 (H) 09/28/2018 1441   ALKPHOS 90 01/24/2017 1228   AST 16 09/28/2018 1441   AST 14 (L) 03/27/2018 1324   AST 16 01/24/2017 1228   ALT 29 09/28/2018 1441   ALT 18 03/27/2018 1324   ALT 14 01/24/2017 1228   BILITOT 0.4 09/28/2018 1441   BILITOT 0.2 (L) 03/27/2018 1324   BILITOT 0.34 01/24/2017 1228       RADIOGRAPHIC STUDIES: I have personally reviewed the radiological images as listed and agreed with the findings in the report. Ir Nephrostomy Exchange Left  Result Date: 09/18/2018 INDICATION: Bilateral ureteral obstruction EXAM: BILATERAL NEPHROSTOMY EXCHANGE COMPARISON:  None. MEDICATIONS: None ANESTHESIA/SEDATION: None CONTRAST:  10 cc Isovue-300-administered into the collecting system(s) FLUOROSCOPY TIME:  Fluoroscopy Time: 1 minutes 18  seconds (22 mGy). COMPLICATIONS: None immediate. PROCEDURE: Informed written consent was obtained from the patient after a thorough discussion of the procedural risks, benefits and alternatives. All questions were addressed. Maximal Sterile Barrier Technique was utilized including caps, mask, sterile gowns, sterile gloves, sterile drape, hand hygiene and skin antiseptic. A timeout was performed prior to the initiation of the procedure. The back was prepped and draped in a sterile fashion. The left nephrostomy was cut and exchanged over a Bentson wire for a 12 French nephrostomy catheter. It was looped and string fixed in the left renal pelvis. Contrast was injected. The right nephrostomy catheter was then cut and exchanged over a Bentson wire for a 12 French [employer] catheter. It was looped and string fixed in the right renal pelvis. Contrast was injected. FINDINGS: Images demonstrate exchange of the bilateral 31 French nephrostomy catheters. There positioned in the renal pelvises. IMPRESSION: Successful bilateral 12 French nephrostomy catheter exchange. Electronically Signed   By: Marybelle Killings M.D.   On: 09/18/2018 09:25   Ir Nephrostomy Exchange Right  Result Date: 09/18/2018 INDICATION: Bilateral ureteral obstruction EXAM: BILATERAL NEPHROSTOMY EXCHANGE COMPARISON:  None. MEDICATIONS: None ANESTHESIA/SEDATION: None CONTRAST:  10 cc Isovue-300-administered into the collecting system(s) FLUOROSCOPY TIME:  Fluoroscopy Time: 1 minutes 18 seconds (22 mGy). COMPLICATIONS: None immediate. PROCEDURE: Informed written consent was obtained from the patient after a thorough discussion of the procedural risks, benefits and alternatives. All questions were addressed. Maximal Sterile Barrier Technique was utilized including caps, mask, sterile gowns, sterile gloves, sterile drape, hand hygiene and skin antiseptic. A timeout was performed prior to the initiation of the procedure. The back was prepped and draped in a  sterile fashion. The left nephrostomy was cut and exchanged over a Bentson wire for a 12 French nephrostomy catheter. It was looped and string fixed in the left renal pelvis. Contrast  was injected. The right nephrostomy catheter was then cut and exchanged over a Bentson wire for a 12 French [employer] catheter. It was looped and string fixed in the right renal pelvis. Contrast was injected. FINDINGS: Images demonstrate exchange of the bilateral 48 French nephrostomy catheters. There positioned in the renal pelvises. IMPRESSION: Successful bilateral 12 French nephrostomy catheter exchange. Electronically Signed   By: Marybelle Killings M.D.   On: 09/18/2018 09:25    All questions were answered. The patient knows to call the clinic with any problems, questions or concerns. No barriers to learning was detected.  I spent 15 minutes counseling the patient face to face. The total time spent in the appointment was 20 minutes and more than 50% was on counseling and review of test results  Heath Lark, MD 09/28/2018 4:02 PM

## 2018-09-28 NOTE — Assessment & Plan Note (Signed)
She will continue risk factor modification We discussed the importance of dietary modification to get her diabetes under control and adequate fluid intake to prevent dehydration

## 2018-09-28 NOTE — Telephone Encounter (Signed)
Gave avs and calendar ° °

## 2018-09-28 NOTE — Assessment & Plan Note (Signed)
Her last imaging study was done in 2019 which showed no evidence of residual disease The patient had significant postop complications with recurrent infection, renal failure and chronic pain with persistent stents and drainage placed. She has multiple appointment pending with general surgery for evaluation At this point in time, there is no indication for her to get surveillance imaging study as the patient had multiple scans done over the past 2 years

## 2018-09-29 ENCOUNTER — Encounter: Payer: Self-pay | Admitting: Hematology and Oncology

## 2018-10-01 ENCOUNTER — Encounter: Payer: Self-pay | Admitting: Family Medicine

## 2018-10-02 ENCOUNTER — Other Ambulatory Visit: Payer: Self-pay | Admitting: Family Medicine

## 2018-10-02 ENCOUNTER — Telehealth: Payer: Self-pay

## 2018-10-02 DIAGNOSIS — R52 Pain, unspecified: Secondary | ICD-10-CM

## 2018-10-02 MED ORDER — PREGABALIN 50 MG PO CAPS: ORAL_CAPSULE | ORAL | 3 refills | Status: DC

## 2018-10-02 NOTE — Telephone Encounter (Signed)
Last written: 07/04/18 Last ov: 09/11/18 Next ov: 11/21/18 Contract: will get at 11/21/18 visit UDS: will get at 11/21/18 visit

## 2018-10-02 NOTE — Telephone Encounter (Signed)
Called back per Dr. Alvy Bimler. Regarding above message. Per Dr. Alvy Bimler, she recommends that [facility] never get the Shingles vaccine. If she ever does have symptoms of shingles she should go immediately to MD and get medication. The side effects of the shingles vaccine can make you feel like you have the flu, etc. She can ask the ID MD when she sees them this week. She verbalized understanding.

## 2018-10-02 NOTE — Telephone Encounter (Signed)
Called her regarding mychart message asking about getting Shingles Vaccine. Per Dr. Alvy Bimler, she does not recommend that she get the Shingles vaccine. She does not understand why she cannot have the shingles vaccine.  Her question.  Does Dr. Luster Landsberg mean to never get the shingles vaccine or wait? Her next appt with Dr. Alvy Bimler is not until 10/2019. Her white count was down because she was going to get her granix injection. She is concerned that she may get shingles. She is scheduled to see Infectious disease MD this week.

## 2018-10-03 ENCOUNTER — Ambulatory Visit (INDEPENDENT_AMBULATORY_CARE_PROVIDER_SITE_OTHER): Payer: BLUE CROSS/BLUE SHIELD | Admitting: Family Medicine

## 2018-10-03 ENCOUNTER — Encounter (INDEPENDENT_AMBULATORY_CARE_PROVIDER_SITE_OTHER): Payer: Self-pay | Admitting: Family Medicine

## 2018-10-03 VITALS — BP 108/69 | HR 71 | Ht 62.0 in | Wt 190.0 lb

## 2018-10-03 DIAGNOSIS — F3289 Other specified depressive episodes: Secondary | ICD-10-CM

## 2018-10-03 DIAGNOSIS — E669 Obesity, unspecified: Secondary | ICD-10-CM

## 2018-10-03 DIAGNOSIS — Z6834 Body mass index (BMI) 34.0-34.9, adult: Secondary | ICD-10-CM

## 2018-10-03 IMAGING — US IR PICC >5YO
1 series · 1 of 1 positions shown · non-contrast
Comparison: none

INDICATION: Poor venous access. Request PICC line placement for prolonged IV
therapies.

[Series 1: ir picc >5yo · 1 of 1 slices shown]
[im 1/1]
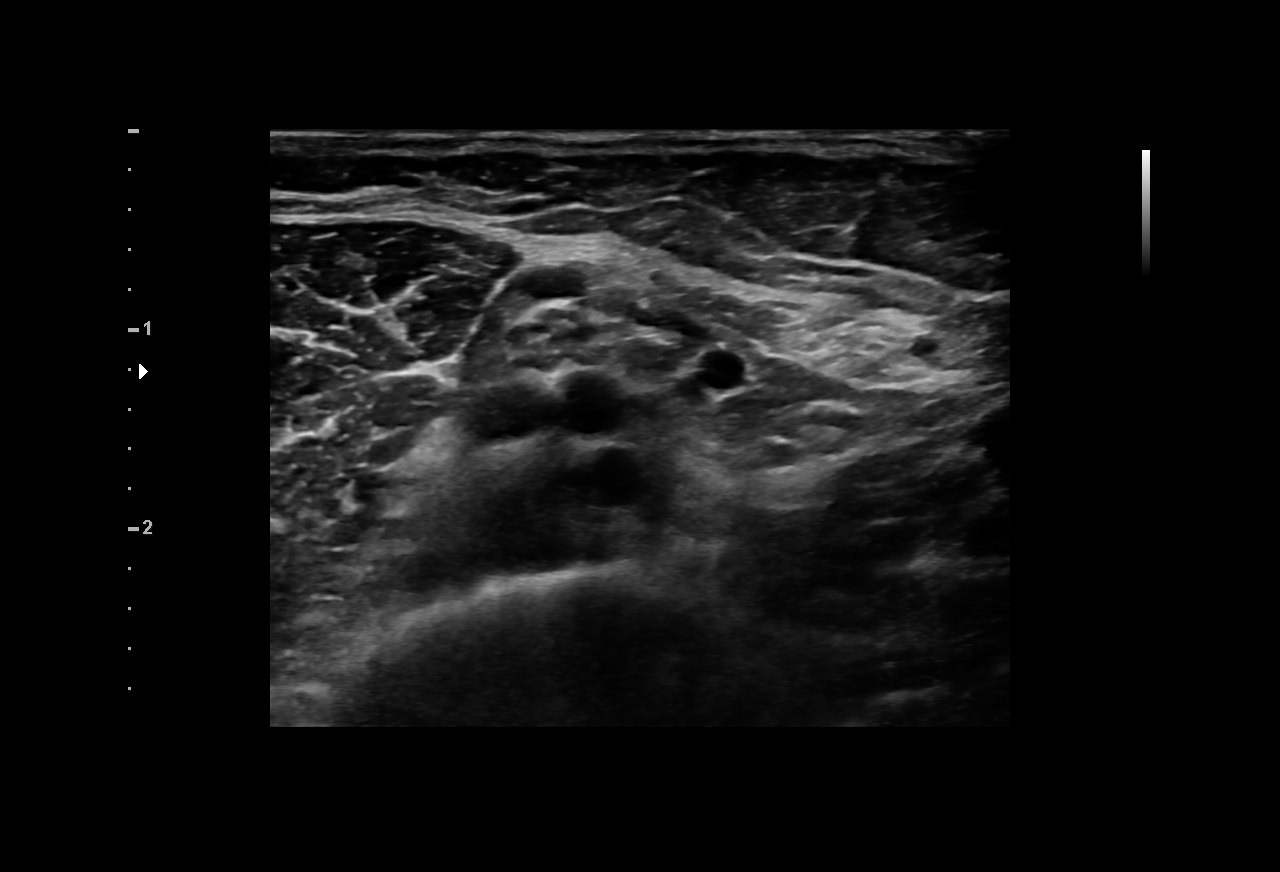

[1 of 1 positions shown; findings below may reference images not displayed]

EXAM:
RIGHT UPPER EXTREMITY PICC LINE PLACEMENT WITH ULTRASOUND AND
FLUOROSCOPIC GUIDANCE

MEDICATIONS:
None;

ANESTHESIA/SEDATION:
Moderate Sedation Time:  None

The patient was continuously monitored during the procedure by the
interventional radiology nurse under my direct supervision.

FLUOROSCOPY TIME:  Fluoroscopy Time: 36 seconds

COMPLICATIONS:
None immediate.

PROCEDURE:
The patient was advised of the possible risks and complications and
agreed to undergo the procedure. The patient was then brought to the
angiographic suite for the procedure.

The right arm was prepped with chlorhexidine, draped in the usual
sterile fashion using maximum barrier technique (cap and mask,
sterile gown, sterile gloves, large sterile sheet, hand hygiene and
cutaneous antiseptic). Local anesthesia was attained by infiltration
with 1% lidocaine.

Ultrasound demonstrated patency of the basilic vein, and this was
documented with an image. Under real-time ultrasound guidance, this
vein was accessed with a 21 gauge micropuncture needle and image
documentation was performed. The needle was exchanged over a
guidewire for a peel-away sheath through which a 35 cm 5 French dual
lumen power injectable PICC was advanced, and positioned with its
tip at the lower SVC/right atrial junction. Fluoroscopy during the
procedure and fluoro spot radiograph confirms appropriate catheter
position. The catheter was flushed, secured to the skin, and covered
with a sterile dressing.
IMPRESSION: Successful placement of a right arm PICC with sonographic and
fluoroscopic guidance. The catheter is ready for use.

## 2018-10-03 MED ORDER — BUPROPION HCL ER (SR) 200 MG PO TB12: 200.0000 mg | ORAL_TABLET | Freq: Every day | ORAL | 0 refills | Status: DC

## 2018-10-03 NOTE — Progress Notes (Signed)
Office: [phone]  /  Fax: [phone]   HPI:   Chief Complaint: OBESITY [name] is here to discuss her progress with her obesity treatment plan. She is on the keep a food journal with 1200-1300 calories and 75+ grams of protein daily and is following her eating plan approximately 95 % of the time. She states she is exercising 0 minutes 0 times per week. [name] continues to do well with weight loss. Her BMI is now under 35 and she can now be considered for surgery. She is meeting her protein goal regularly and her albumin has improved.  Her weight is 190 lb (86.2 kg) today and has had a weight loss of 2 pounds over a period of 3 weeks since her last visit. She has lost 18 lbs since starting treatment with [place].  Depression with emotional eating behaviors [name] started Wellbutrin but doesn't feel it has helped her mood. She has done better with weight loss however. [name] struggles with emotional eating and using food for comfort to the extent that it is negatively impacting her health. She often snacks when she is not hungry. [name] sometimes feels she is out of control and then feels guilty that she made poor food choices. She has been working on behavior modification techniques to help reduce her emotional eating and has been somewhat successful. She shows no sign of suicidal or homicidal ideations.  Depression screen [name] 2/9 04/20/2018 04/05/2018 06/21/2017 08/30/2016  Decreased Interest 0 1 0 0  Down, Depressed, Hopeless 1 1 0 0  PHQ - 2 Score 1 2 0 0  Altered sleeping 0 0 - -  Tired, decreased energy 1 1 - -  Change in appetite 0 1 - -  Feeling bad or failure about yourself  0 0 - -  Trouble concentrating 0 0 - -  Moving slowly or fidgety/restless 0 0 - -  Suicidal thoughts 0 0 - -  PHQ-9 Score 2 4 - -  Difficult doing work/chores - Somewhat difficult - -  Some recent data might be hidden    ASSESSMENT AND PLAN:  Other depression - with emotional eating - Plan: buPROPion (WELLBUTRIN  SR) 200 MG 12 hr tablet  Class 1 obesity with serious comorbidity and body mass index (BMI) of 34.0 to 34.9 in adult, unspecified obesity type  PLAN:  Depression with Emotional Eating Behaviors We discussed behavior modification techniques today to help [name] deal with her emotional eating and depression. [name] agrees to increase Wellbutrin SR to 200 mg q AM #30 with no refills. [name] agrees to follow up with our clinic in 3 to 4 weeks.  I spent > than 50% of the 25 minute visit on counseling as documented in the note.  Obesity [name] is currently in the action stage of change. As such, her goal is to continue with weight loss efforts She has agreed to keep a food journal with 1200-1300 calories and 75+ grams of protein daily [name] has been instructed to work up to a goal of 150 minutes of combined cardio and strengthening exercise per week for weight loss and overall health benefits. We discussed the following Behavioral Modification Strategies today: increasing lean protein intake, decreasing simple carbohydrates  and work on meal planning and easy cooking plans   [name] has agreed to follow up with our clinic in 3 to 4 weeks. She was informed of the importance of frequent follow up visits to maximize her success with intensive lifestyle modifications for her multiple health conditions.  ALLERGIES: Allergies  Allergen Reactions  . Penicillins Swelling    Facial swelling/childhood allergy Has patient had a PCN reaction causing immediate rash, facial/tongue/throat swelling, SOB or lightheadedness with hypotension: Yes Has patient had a PCN reaction causing severe rash involving mucus membranes or skin necrosis: Yes Has patient had a PCN reaction that required hospitalization yes Has patient had a PCN reaction occurring within the last 10 years: No If all of the above answers are "NO", then may proceed with Cephalosporin use.   . Cefaclor Rash    Ceclor  . Erythromycin Other (See  Comments)    Gastritis, abd cramps  . Tape Rash    blisters  . Trimethoprim Rash  . Ultram [Tramadol] Hives  . Cephalosporins Rash  . Fluconazole Rash  . Oxycodone Other (See Comments)    " I just feel weird"  . Pectin Rash    Pectin ring for stoma  . Septra [Sulfamethoxazole-Trimethoprim] Rash  . Sulfa Antibiotics Rash    MEDICATIONS: Current Outpatient Medications on File Prior to Visit  Medication Sig Dispense Refill  . acetaminophen (TYLENOL) 325 MG tablet Take 2 tablets (650 mg total) by mouth every 6 (six) hours as needed. 30 tablet 1  . anastrozole (ARIMIDEX) 1 MG tablet TAKE 1 TABLET DAILY (Patient taking differently: Take 1 mg by mouth daily. ) 90 tablet 3  . Biotin 5 MG TABS Take 5 mg by mouth every morning.     . Cholecalciferol (VITAMIN D3) 10000 UNITS capsule Take 10,000 Units by mouth once a week. Sunday evening's    . clobetasol (OLUX) 0.05 % topical foam Apply topically 2 (two) times daily.    . diphenhydrAMINE (BENADRYL) 25 mg capsule Take 1 capsule (25 mg total) by mouth every 8 (eight) hours as needed for itching, allergies or sleep. 30 capsule 0  . diphenoxylate-atropine (LOMOTIL) 2.5-0.025 MG tablet Take 1 tablet by mouth 4 (four) times daily as needed for diarrhea or loose stools. TO PREVENT LOOSE BOWEL MOVEMENTS (Patient taking differently: Take 1 tablet by mouth every other day. TO PREVENT LOOSE BOWEL MOVEMENTS) 30 tablet 0  . fentaNYL (DURAGESIC) 50 MCG/HR Place 1 patch onto the skin every 3 (three) days. 10 patch 0  . ferrous sulfate 325 (65 FE) MG tablet Take 1 tablet (325 mg total) by mouth at bedtime. 30 tablet 3  . HYDROcodone-acetaminophen (NORCO/VICODIN) 5-325 MG tablet Take 1 tablet by mouth every 6 (six) hours as needed for moderate pain. 30 tablet 0  . JANUVIA 50 MG tablet Take 50 mg by mouth daily.     [name] Kitchen levothyroxine (SYNTHROID, LEVOTHROID) 150 MCG tablet Take 1 tablet (150 mcg total) by mouth daily before breakfast. 30 tablet 1  . loratadine  (CLARITIN) 10 MG tablet Take 10 mg by mouth every morning.     . nitrofurantoin, macrocrystal-monohydrate, (MACROBID) 100 MG capsule Take 100 mg by mouth at bedtime.    [name] Kitchen omega-3 acid ethyl esters (LOVAZA) 1 G capsule Take 1 g by mouth 2 (two) times daily.     Vladimir Faster Glycol-Propyl Glycol (SYSTANE OP) Place 1 drop into both eyes daily as needed (dry eyes).     . pregabalin (LYRICA) 50 MG capsule TAKE 1 CAPSULE(50 MG) BY MOUTH TWICE DAILY 180 capsule 3  . Prenatal Vit-Fe Fumarate-FA (PRENATAL VITAMIN PO) Take 1 capsule by mouth daily. Takes prenatal because there are no dyes in it    . rosuvastatin (CRESTOR) 10 MG tablet Take 10 mg by mouth every evening.     [name] Kitchen  Saccharomyces boulardii (FLORASTOR PO) Take 1 capsule by mouth daily.    . sodium bicarbonate 650 MG tablet Take 650 mg by mouth 2 (two) times daily.    . sodium chloride 0.9 % injection Inject 10 mLs into the vein daily.    . Tbo-Filgrastim (GRANIX) 480 MCG/0.8ML SOSY injection Inject 0.8 mLs (480 mcg total) into the skin daily. (Patient taking differently: Inject 480 mcg into the skin See admin instructions. Every 6 days) 30 Syringe 11   No current facility-administered medications on file prior to visit.     PAST MEDICAL HISTORY: Past Medical History:  Diagnosis Date  . Adrenal adenoma, left 02/08/2016   CT: stable benign  . Anemia in neoplastic disease   . Back pain   . Benign essential HTN   . 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 Cancer of corpus uteri, except isthmus [facility])  oncologist-- dr Denman [name] and dr Alvy Bimler    10-15-2004  dx endometroid endometrial and ovarian cancer s/p  chemotheapy and surgery(TAH w/ BSO) :  recurrent 11-19-2014 post pelvic surgery and radiation 01-29-2015 to 03-10-2015  . Chronic idiopathic neutropenia (HCC)     presumed related to chemotherapy March 2006--- followed by dr Alvy Bimler (treatment w/ G-CSF injections  . Chronic nausea   . Chronic pain    perineal/ anal  area from bladder pad irritates skin , right flank pain  . CKD stage G2/A3, GFR 60-89 and albumin creatinine ratio >300 mg/g    nephrologist-  dr Madelon Lips  . Colovesical fistula   . Diabetic retinopathy, background ([place])   . Difficult intravenous access    small veins--- hx PICC lines  . DM type 2 (diabetes mellitus, type 2) ([place])    monitored by dr Legrand Como altheimer  . Dysuria   . Environmental and seasonal allergies   . Fatty liver 02/08/2016   CT  . Generalized muscle weakness   . GERD (gastroesophageal reflux disease)   . Hiatal hernia   . History of abdominal abscess 04/16/2017   post surgery 04-01-2017  --- resolved 10/ 2018  . History of gastric polyp    2014  duodenum  . History of ileus 04/16/2017   resolved w/ no surgical intervention  . History of radiation therapy    01-29-2015 to 03-10-2015  pelvis 50.4Gy  . Hypothyroidism    monitored by dr Legrand Como altheimer  . IBS (irritable bowel syndrome)   . Ileostomy in place [place] [name]) 04/01/2017   created at same time colostomy takedown.  . Joint pain   . Leg edema   . Lower urinary tract symptoms (LUTS)    urge urinary  incontinence  . Mixed dyslipidemia   . Multiple thyroid nodules    Managed by Dr. Harlow Asa  . Nephrostomy status ([place])   . Palpitations   . Pelvic abscess in female 04/16/2017  . PONV (postoperative nausea and vomiting)    "scopolamine patch works for me"  . Radiation-induced dermatitis    contact dermatitis , radiation completed, rash only on ankles now.  . Seasonal allergies   . Ureteral stricture, right UROLOGIT-  DR [name]   CHRONIC--  TREATMENT URETERAL STENT  . 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 Vitamin D deficiency   . Wears glasses     PAST SURGICAL HISTORY: Past Surgical History:  Procedure Laterality  Date  .  APPENDECTOMY    . biopsy thyroid nodules    . BREAST RECONSTRUCTION WITH PLACEMENT OF TISSUE EXPANDER AND FLEX HD (ACELLULAR HYDRATED DERMIS) Bilateral 12/11/2015   Procedure: BILATERAL BREAST RECONSTRUCTION WITH PLACEMENT OF TISSUE EXPANDERS;  Surgeon: Irene Limbo, MD;  Location: [place];  Service: Plastics;  Laterality: Bilateral;  . COLONOSCOPY WITH PROPOFOL N/A 08/21/2013   Procedure: COLONOSCOPY WITH PROPOFOL;  Surgeon: Cleotis Nipper, MD;  [facility];  Service: Endoscopy;  Laterality: N/A;  . COLOSTOMY TAKEDOWN N/A 12/04/2014   Procedure: LAPROSCOPIC LYSIS OF ADHESIONS, SPLENIC MOBILIZATION, RELOCATION OF COLOSTOMY, DEBRIDEMENT INITIAL COLOSTOMY SITE;  Surgeon: Michael [name], MD;  [facility];  Service: General;  Laterality: N/A;  . CYSTOGRAM N/A 06/01/2017   Procedure: CYSTOGRAM;  Surgeon: Alexis Frock, MD;  [facility];  Service: Urology;  Laterality: N/A;  . CYSTOSCOPY W/ RETROGRADES Right 11/21/2015   Procedure: CYSTOSCOPY WITH RETROGRADE PYELOGRAM;  Surgeon: Alexis Frock, MD;  [facility];  Service: Urology;  Laterality: Right;  . CYSTOSCOPY W/ URETERAL STENT PLACEMENT Right 11/21/2015   Procedure: CYSTOSCOPY WITH STENT REPLACEMENT;  Surgeon: Alexis Frock, MD;  [facility];  Service: Urology;  Laterality: Right;  . CYSTOSCOPY W/ URETERAL STENT PLACEMENT Right 03/10/2016   Procedure: CYSTOSCOPY WITH STENT REPLACEMENT;  Surgeon: Alexis Frock, MD;  Location[facility];  Service: Urology;  Laterality: Right;  . CYSTOSCOPY W/ URETERAL STENT PLACEMENT Right 06/30/2016   Procedure: CYSTOSCOPY WITH RETROGRADE PYELOGRAM/URETERAL STENT EXCHANGE;  Surgeon: Alexis Frock, MD;  Location[name];  Service: Urology;  Laterality: Right;  . CYSTOSCOPY W/ URETERAL STENT PLACEMENT N/A 06/01/2017   Procedure: CYSTOSCOPY WITH EXAM UNDER ANESTHESIA;  Surgeon: Alexis Frock, MD;  [facility];  Service: Urology;  Laterality: N/A;   . CYSTOSCOPY W/ URETERAL STENT PLACEMENT Right 08/17/2017   Procedure: CYSTOSCOPY WITH RETROGRADE PYELOGRAM/URETERAL STENT REMOVAL;  Surgeon: Alexis Frock, MD;  [name];  Service: Urology;  Laterality: Right;  . CYSTOSCOPY WITH RETROGRADE PYELOGRAM, URETEROSCOPY AND STENT PLACEMENT Right 03/20/2015   Procedure: CYSTOSCOPY WITH RETROGRADE PYELOGRAM, URETEROSCOPY WITH BALLOON DILATION AND STENT PLACEMENT ON RIGHT;  Surgeon: Alexis Frock, MD;  Location[facility];  Service: Urology;  Laterality: Right;  . CYSTOSCOPY WITH RETROGRADE PYELOGRAM, URETEROSCOPY AND STENT PLACEMENT Right 05/02/2015   Procedure: CYSTOSCOPY WITH RIGHT RETROGRADE PYELOGRAM,  DIAGNOSTIC URETEROSCOPY AND STENT PULL ;  Surgeon: Alexis Frock, MD;  Location: [facility];  Service: Urology;  Laterality: Right;  . CYSTOSCOPY WITH RETROGRADE PYELOGRAM, URETEROSCOPY AND STENT PLACEMENT Right 09/05/2015   Procedure: CYSTOSCOPY WITH RETROGRADE PYELOGRAM,  AND STENT PLACEMENT;  Surgeon: Alexis Frock, MD;  [facility];  Service: Urology;  Laterality: Right;  . CYSTOSCOPY WITH RETROGRADE PYELOGRAM, URETEROSCOPY AND STENT PLACEMENT Right 04/01/2017   Procedure: CYSTOSCOPY WITH RETROGRADE PYELOGRAM, URETEROSCOPY AND STENT PLACEMENT;  Surgeon: Alexis Frock, MD;  [facility];  Service: Urology;  Laterality: Right;  . CYSTOSCOPY WITH STENT PLACEMENT Right 10/27/2016   Procedure: CYSTOSCOPY WITH STENT CHANGE and right retrograde pyelogram;  Surgeon: Alexis Frock, MD;  Location: [place];  Service: Urology;  Laterality: Right;  . EUS N/A 10/02/2014   Procedure: LOWER ENDOSCOPIC ULTRASOUND (EUS);  Surgeon: Arta Silence, MD;  Location[facility];  Service: Endoscopy;  Laterality: N/A;  . EXCISION SOFT TISSUE MASS RIGHT FOREMAN  12-08-2006  . EYE SURGERY  as child   pytosis of eyelids repair  . INCISION AND DRAINAGE OF  WOUND Bilateral 12/26/2015    Procedure: DEBRIDEMENT OF BILATERAL MASTECTOMY FLAPS;  Surgeon: Irene Limbo, MD;  Location: [place];  Service: Plastics;  Laterality: Bilateral;  . IR CV LINE INJECTION  05/31/2017  . IR FLUORO GUIDE CV LINE LEFT  05/31/2017  . IR FLUORO GUIDE CV LINE RIGHT  04/06/2017  . IR FLUORO GUIDE CV MIDLINE PICC RIGHT  05/30/2017  . IR NEPHROSTOGRAM LEFT INITIAL PLACEMENT  09/02/2017  . IR NEPHROSTOGRAM LEFT THRU EXISTING ACCESS  11/29/2017  . IR NEPHROSTOGRAM RIGHT INITIAL PLACEMENT  09/02/2017  . IR NEPHROSTOGRAM RIGHT THRU EXISTING ACCESS  09/13/2017  . IR NEPHROSTOGRAM RIGHT THRU EXISTING ACCESS  11/29/2017  . IR NEPHROSTOMY EXCHANGE LEFT  11/28/2017  . IR NEPHROSTOMY EXCHANGE LEFT  01/05/2018  . IR NEPHROSTOMY EXCHANGE LEFT  02/16/2018  . IR NEPHROSTOMY EXCHANGE LEFT  03/30/2018  . IR NEPHROSTOMY EXCHANGE LEFT  05/12/2018  . IR NEPHROSTOMY EXCHANGE LEFT  06/21/2018  . IR NEPHROSTOMY EXCHANGE LEFT  08/04/2018  . IR NEPHROSTOMY EXCHANGE LEFT  09/18/2018  . IR NEPHROSTOMY EXCHANGE RIGHT  10/02/2017  . IR NEPHROSTOMY EXCHANGE RIGHT  11/28/2017  . IR NEPHROSTOMY EXCHANGE RIGHT  01/05/2018  . IR NEPHROSTOMY EXCHANGE RIGHT  02/16/2018  . IR NEPHROSTOMY EXCHANGE RIGHT  03/30/2018  . IR NEPHROSTOMY EXCHANGE RIGHT  05/12/2018  . IR NEPHROSTOMY EXCHANGE RIGHT  06/21/2018  . IR NEPHROSTOMY EXCHANGE RIGHT  08/04/2018  . IR NEPHROSTOMY EXCHANGE RIGHT  09/18/2018  . IR NEPHROSTOMY PLACEMENT LEFT  10/02/2017  . IR RADIOLOGIST EVAL & MGMT  05/03/2017  . IR US GUIDE VASC ACCESS LEFT  05/31/2017  . IR US GUIDE VASC ACCESS RIGHT  04/06/2017  . IR US GUIDE VASC ACCESS RIGHT  05/30/2017  . LAPAROSCOPIC CHOLECYSTECTOMY  1990  . LIPOSUCTION WITH LIPOFILLING Bilateral 04/16/2016   Procedure: LIPOSUCTION WITH LIPOFILLING TO BILATERAL CHEST;  Surgeon: Irene Limbo, MD;  Location: [place];  Service: Plastics;  Laterality: Bilateral;  . MASTECTOMY W/ SENTINEL NODE BIOPSY Bilateral 12/11/2015    Procedure: RIGHT PROPHYLACTIC MASTECTOMY, LEFT TOTAL MASTECTOMY WITH LEFT AXILLARY SENTINEL LYMPH NODE BIOPSY;  Surgeon: Stark Klein, MD;  Location: [place];  Service: General;  Laterality: Bilateral;  . OSTOMY N/A 11/19/2014   Procedure: OSTOMY;  Surgeon: Michael [name], MD;  [facility];  Service: General;  Laterality: N/A;  . PROCTOSCOPY N/A 04/01/2017   Procedure: RIDGE PROCTOSCOPY;  Surgeon: Michael [name], MD;  [facility];  Service: General;  Laterality: N/A;  . REMOVAL OF BILATERAL TISSUE EXPANDERS WITH PLACEMENT OF BILATERAL BREAST IMPLANTS Bilateral 04/16/2016   Procedure: REMOVAL OF BILATERAL TISSUE EXPANDERS WITH PLACEMENT OF BILATERAL BREAST IMPLANTS;  Surgeon: Irene Limbo, MD;  Location: [place];  Service: Plastics;  Laterality: Bilateral;  . ROBOTIC ASSISTED LAP VAGINAL HYSTERECTOMY N/A 11/19/2014   Procedure: ROBOTIC LYSIS OF ADHESIONS, CONVERTED TO LAPAROTOMY RADICAL UPPER VAGINECTOMY,LOW ANTERIOR BOWEL RESECTION, COLOSTOMY, BILATERAL URETERAL STENT PLACEMENT AND CYSTONOMY CLOSURE;  Surgeon: Everitt Amber, MD;  [facility];  Service: Gynecology;  Laterality: N/A;  . TISSUE EXPANDER FILLING Bilateral 12/26/2015   Procedure: EXPANSION OF BILATERAL CHEST TISSUE EXPANDERS (60 mL- Right; 75 mL- Left);  Surgeon: Irene Limbo, MD;  Location: [place];  Service: Plastics;  Laterality: Bilateral;  . TONSILLECTOMY    . TOTAL ABDOMINAL HYSTERECTOMY  March 2006   [facility]   and Bilateral Salpingoophorectomy/  staging for Ovarian cancer/  an  . XI ROBOTIC ASSISTED LOWER ANTERIOR RESECTION N/A 04/01/2017  Procedure: XI ROBOTIC VS LAPAROSCOPIC COLOSTOMY TAKEDOWN WITH LYSIS OF ADHESIONS.;  Surgeon: Michael [name], MD;  [facility];  Service: General;  Laterality: N/A;  ERAS PATHWAY    SOCIAL HISTORY: Social History   Tobacco Use  . Smoking status: Never Smoker  . Smokeless tobacco: Never Used  Substance Use Topics  . Alcohol use: Not  Currently  . Drug use: No    FAMILY HISTORY: Family History  Problem Relation Age of Onset  . Cancer Mother 37       stomach ca  . Hypertension Mother   . Cancer Father 42       prostate ca  . Diabetes Father   . Heart disease Father        CABG  . Hypertension Father   . Hyperlipidemia Father   . Obesity Father   . Breast cancer Maternal Aunt        dx in her 73s  . Lymphoma Paternal Aunt   . Brain cancer Paternal Grandfather   . Ovarian cancer Other   . Diabetes Sister   . Hypertension Brother y-10  . Heart disease Brother        CABG  . Diabetes Brother     ROS: Review of Systems  Constitutional: Positive for weight loss.  Psychiatric/Behavioral: Positive for depression. Negative for suicidal ideas.    PHYSICAL EXAM: Blood pressure 108/69, pulse 71, height 5' 2" (1.575 m), weight 190 lb (86.2 kg), SpO2 91 %. Body mass index is 34.75 kg/m. Physical Exam Vitals signs reviewed.  Constitutional:      Appearance: Normal appearance. She is obese.  Cardiovascular:     Rate and Rhythm: Normal rate.     Pulses: Normal pulses.  Pulmonary:     Effort: Pulmonary effort is normal.     Breath sounds: Normal breath sounds.  Musculoskeletal: Normal range of motion.  Skin:    General: Skin is warm and dry.  Neurological:     Mental Status: She is alert and oriented to person, place, and time.  Psychiatric:        Mood and Affect: Mood normal.        Behavior: Behavior normal.     RECENT LABS AND TESTS: BMET    Component Value Date/Time   NA 138 09/28/2018 1441   NA 141 01/24/2017 1228   K 4.0 09/28/2018 1441   K 4.6 01/24/2017 1228   CL 104 09/28/2018 1441   CO2 22 09/28/2018 1441   CO2 29 01/24/2017 1228   GLUCOSE 107 (H) 09/28/2018 1441   GLUCOSE 84 01/24/2017 1228   BUN 29 (H) 09/28/2018 1441   BUN 22.2 01/24/2017 1228   CREATININE 1.86 (H) 09/28/2018 1441   CREATININE 1.98 (H) 03/27/2018 1324   CREATININE 0.8 01/24/2017 1228   CALCIUM 9.7  09/28/2018 1441   CALCIUM 10.2 01/24/2017 1228   GFRNONAA 28 (L) 09/28/2018 1441   GFRNONAA 26 (L) 03/27/2018 1324   GFRNONAA 78 12/16/2014 1530   GFRAA 33 (L) 09/28/2018 1441   GFRAA 30 (L) 03/27/2018 1324   GFRAA >89 12/16/2014 1530   Lab Results  Component Value Date   HGBA1C 7.9 (H) 09/01/2017   HGBA1C 5.3 04/01/2017   HGBA1C 5.8 (H) 09/05/2015   HGBA1C 6.8 (H) 11/21/2014   HGBA1C 6.3 (H) 11/07/2014   No results found for: INSULIN CBC    Component Value Date/Time   WBC 2.0 (L) 09/28/2018 1441   RBC 3.85 (L) 09/28/2018 1441   HGB 11.4 (  L) 09/28/2018 1441   HGB 10.2 (L) 03/27/2018 1324   HGB 12.4 07/29/2017 1444   HCT 36.6 09/28/2018 1441   HCT 38.0 07/29/2017 1444   PLT 206 09/28/2018 1441   PLT 268 03/27/2018 1324   PLT 260 07/29/2017 1444   MCV 95.1 09/28/2018 1441   MCV 90.9 07/29/2017 1444   MCH 29.6 09/28/2018 1441   MCHC 31.1 09/28/2018 1441   RDW 14.0 09/28/2018 1441   RDW 15.5 (H) 07/29/2017 1444   LYMPHSABS 0.5 (L) 09/28/2018 1441   LYMPHSABS 0.8 (L) 07/29/2017 1444   MONOABS 0.8 09/28/2018 1441   MONOABS 1.0 (H) 07/29/2017 1444   EOSABS 0.1 09/28/2018 1441   EOSABS 0.0 07/29/2017 1444   BASOSABS 0.0 09/28/2018 1441   BASOSABS 0.0 07/29/2017 1444   Iron/TIBC/Ferritin/ %Sat    Component Value Date/Time   IRON 7 (L) 08/31/2017 0451   IRON 12 (L) 11/29/2014 1251   TIBC 164 (L) 08/31/2017 0451   TIBC 331 11/29/2014 1251   FERRITIN 27 11/29/2014 1251   IRONPCTSAT 4 (L) 08/31/2017 0451   IRONPCTSAT 4 (L) 11/29/2014 1251   Lipid Panel     Component Value Date/Time   CHOL 109 08/28/2015 1617   TRIG 89 04/18/2017 0427   HDL 43.80 08/28/2015 1617   CHOLHDL 2 08/28/2015 1617   VLDL 19.8 08/28/2015 1617   LDLCALC 45 08/28/2015 1617   Hepatic Function Panel     Component Value Date/Time   PROT 8.6 (H) 09/28/2018 1441   PROT 6.9 01/24/2017 1228   ALBUMIN 3.2 (L) 09/28/2018 1441   ALBUMIN 3.4 (L) 01/24/2017 1228   AST 16 09/28/2018 1441   AST 14  (L) 03/27/2018 1324   AST 16 01/24/2017 1228   ALT 29 09/28/2018 1441   ALT 18 03/27/2018 1324   ALT 14 01/24/2017 1228   ALKPHOS 284 (H) 09/28/2018 1441   ALKPHOS 90 01/24/2017 1228   BILITOT 0.4 09/28/2018 1441   BILITOT 0.2 (L) 03/27/2018 1324   BILITOT 0.34 01/24/2017 1228      Component Value Date/Time   TSH 2.408 08/30/2017 0623   TSH 0.63 08/28/2015 1617      OBESITY BEHAVIORAL INTERVENTION VISIT  Today's visit was # 9   Starting weight: 208 lbs Starting date: 04/05/18 Today's weight : 190 lbs  Today's date: 10/03/2018 Total lbs lost to date: 18    10/03/2018  Height 5' 2" (1.575 m)  Weight 190 lb (86.2 kg)  BMI (Calculated) 34.74  BLOOD PRESSURE - SYSTOLIC 150  BLOOD PRESSURE - DIASTOLIC 69   Body Fat % 56.9 %  Total Body Water (lbs) 73.2 lbs     ASK: We discussed the diagnosis of obesity with [name] today and [name] agreed to give [place] permission to discuss obesity behavioral modification therapy today.  ASSESS: [name] has the diagnosis of obesity and her BMI today is 34.74 [name] is in the action stage of change   ADVISE: [name] was educated on the multiple health risks of obesity as well as the benefit of weight loss to improve her health. She was advised of the need for long term treatment and the importance of lifestyle modifications to improve her current health and to decrease her risk of future health problems.  AGREE: Multiple dietary modification options and treatment options were discussed and  [name] agreed to follow the recommendations documented in the above note.  ARRANGE: [name] was educated on the importance of frequent visits to treat obesity as outlined per  CMS and USPSTF guidelines and agreed to schedule her next follow up appointment today.  I, Trixie Dredge, am acting as transcriptionist for Dennard Nip, MD  I have reviewed the above documentation for accuracy and completeness, and I agree with the above. -Dennard Nip, MD

## 2018-10-04 ENCOUNTER — Telehealth: Payer: Self-pay | Admitting: Behavioral Health

## 2018-10-04 ENCOUNTER — Emergency Department (HOSPITAL_COMMUNITY): Payer: BLUE CROSS/BLUE SHIELD

## 2018-10-04 ENCOUNTER — Encounter (HOSPITAL_COMMUNITY): Payer: Self-pay

## 2018-10-04 ENCOUNTER — Other Ambulatory Visit: Payer: Self-pay

## 2018-10-04 ENCOUNTER — Emergency Department (HOSPITAL_COMMUNITY)
Admission: EM | Admit: 2018-10-04 | Discharge: 2018-10-04 | Disposition: A | Discharge status: Home / Self Care | Payer: BLUE CROSS/BLUE SHIELD | Attending: Emergency Medicine | Admitting: Emergency Medicine

## 2018-10-04 DIAGNOSIS — N390 Urinary tract infection, site not specified: Secondary | ICD-10-CM | POA: Insufficient documentation

## 2018-10-04 DIAGNOSIS — N183 Chronic kidney disease, stage 3 (moderate): Secondary | ICD-10-CM | POA: Diagnosis not present

## 2018-10-04 DIAGNOSIS — E11319 Type 2 diabetes mellitus with unspecified diabetic retinopathy without macular edema: Secondary | ICD-10-CM | POA: Insufficient documentation

## 2018-10-04 DIAGNOSIS — G8928 Other chronic postprocedural pain: Secondary | ICD-10-CM

## 2018-10-04 DIAGNOSIS — R509 Fever, unspecified: Secondary | ICD-10-CM | POA: Diagnosis not present

## 2018-10-04 DIAGNOSIS — R1084 Generalized abdominal pain: Secondary | ICD-10-CM | POA: Diagnosis not present

## 2018-10-04 DIAGNOSIS — Z853 Personal history of malignant neoplasm of breast: Secondary | ICD-10-CM | POA: Insufficient documentation

## 2018-10-04 DIAGNOSIS — I129 Hypertensive chronic kidney disease with stage 1 through stage 4 chronic kidney disease, or unspecified chronic kidney disease: Secondary | ICD-10-CM | POA: Diagnosis not present

## 2018-10-04 DIAGNOSIS — R102 Pelvic and perineal pain: Secondary | ICD-10-CM | POA: Diagnosis not present

## 2018-10-04 DIAGNOSIS — R109 Unspecified abdominal pain: Secondary | ICD-10-CM

## 2018-10-04 DIAGNOSIS — T82594D Other mechanical complication of infusion catheter, subsequent encounter: Secondary | ICD-10-CM | POA: Diagnosis not present

## 2018-10-04 DIAGNOSIS — R5383 Other fatigue: Secondary | ICD-10-CM | POA: Diagnosis not present

## 2018-10-04 DIAGNOSIS — N200 Calculus of kidney: Secondary | ICD-10-CM | POA: Diagnosis not present

## 2018-10-04 DIAGNOSIS — N179 Acute kidney failure, unspecified: Secondary | ICD-10-CM | POA: Diagnosis not present

## 2018-10-04 DIAGNOSIS — Z79899 Other long term (current) drug therapy: Secondary | ICD-10-CM | POA: Diagnosis not present

## 2018-10-04 DIAGNOSIS — R079 Chest pain, unspecified: Secondary | ICD-10-CM | POA: Diagnosis not present

## 2018-10-04 DIAGNOSIS — T83592A Infection and inflammatory reaction due to indwelling ureteral stent, initial encounter: Secondary | ICD-10-CM | POA: Diagnosis not present

## 2018-10-04 DIAGNOSIS — E039 Hypothyroidism, unspecified: Secondary | ICD-10-CM | POA: Insufficient documentation

## 2018-10-04 DIAGNOSIS — R1030 Lower abdominal pain, unspecified: Secondary | ICD-10-CM | POA: Diagnosis not present

## 2018-10-04 DIAGNOSIS — L89312 Pressure ulcer of right buttock, stage 2: Secondary | ICD-10-CM | POA: Diagnosis not present

## 2018-10-04 DIAGNOSIS — D631 Anemia in chronic kidney disease: Secondary | ICD-10-CM | POA: Diagnosis not present

## 2018-10-04 DIAGNOSIS — E1122 Type 2 diabetes mellitus with diabetic chronic kidney disease: Secondary | ICD-10-CM | POA: Diagnosis not present

## 2018-10-04 DIAGNOSIS — D709 Neutropenia, unspecified: Secondary | ICD-10-CM | POA: Diagnosis not present

## 2018-10-04 DIAGNOSIS — N182 Chronic kidney disease, stage 2 (mild): Secondary | ICD-10-CM | POA: Diagnosis not present

## 2018-10-04 DIAGNOSIS — L89153 Pressure ulcer of sacral region, stage 3: Secondary | ICD-10-CM | POA: Diagnosis not present

## 2018-10-04 DIAGNOSIS — L89311 Pressure ulcer of right buttock, stage 1: Secondary | ICD-10-CM | POA: Diagnosis not present

## 2018-10-04 DIAGNOSIS — Z452 Encounter for adjustment and management of vascular access device: Secondary | ICD-10-CM | POA: Diagnosis not present

## 2018-10-04 DIAGNOSIS — B9562 Methicillin resistant Staphylococcus aureus infection as the cause of diseases classified elsewhere: Secondary | ICD-10-CM | POA: Diagnosis not present

## 2018-10-04 DIAGNOSIS — C541 Malignant neoplasm of endometrium: Secondary | ICD-10-CM | POA: Diagnosis not present

## 2018-10-04 LAB — COMPREHENSIVE METABOLIC PANEL
ALK PHOS: 550 U/L — AB (ref 38–126)
ALT: 55 U/L — ABNORMAL HIGH (ref 0–44)
AST: 28 U/L (ref 15–41)
Albumin: 3 g/dL — ABNORMAL LOW (ref 3.5–5.0)
Anion gap: 10 (ref 5–15)
BUN: 36 mg/dL — AB (ref 8–23)
CALCIUM: 9.3 mg/dL (ref 8.9–10.3)
CO2: 20 mmol/L — ABNORMAL LOW (ref 22–32)
Chloride: 106 mmol/L (ref 98–111)
Creatinine, Ser: 1.94 mg/dL — ABNORMAL HIGH (ref 0.44–1.00)
GFR calc Af Amer: 31 mL/min — ABNORMAL LOW (ref >60)
GFR calc non Af Amer: 27 mL/min — ABNORMAL LOW (ref >60)
Glucose, Bld: 132 mg/dL — ABNORMAL HIGH (ref 70–99)
Potassium: 3.6 mmol/L (ref 3.5–5.1)
Sodium: 136 mmol/L (ref 135–145)
Total Bilirubin: 0.5 mg/dL (ref 0.3–1.2)
Total Protein: 8.4 g/dL — ABNORMAL HIGH (ref 6.5–8.1)

## 2018-10-04 LAB — CBC WITH DIFFERENTIAL/PLATELET
Abs Immature Granulocytes: 0 10*3/uL (ref 0.00–0.07)
Band Neutrophils: 3 %
Basophils Absolute: 0.1 10*3/uL (ref 0.0–0.1)
Basophils Relative: 3 %
EOS PCT: 0 %
Eosinophils Absolute: 0 10*3/uL (ref 0.0–0.5)
HEMATOCRIT: 36.7 % (ref 36.0–46.0)
Hemoglobin: 11.2 g/dL — ABNORMAL LOW (ref 12.0–15.0)
Lymphocytes Relative: 18 %
Lymphs Abs: 0.7 10*3/uL (ref 0.7–4.0)
MCH: 29.5 pg (ref 26.0–34.0)
MCHC: 30.5 g/dL (ref 30.0–36.0)
MCV: 96.6 fL (ref 80.0–100.0)
Monocytes Absolute: 0.3 10*3/uL (ref 0.1–1.0)
Monocytes Relative: 9 %
Neutro Abs: 2.7 10*3/uL (ref 1.7–7.7)
Neutrophils Relative %: 67 %
Platelets: 282 10*3/uL (ref 150–400)
RBC: 3.8 MIL/uL — ABNORMAL LOW (ref 3.87–5.11)
RDW: 13.7 % (ref 11.5–15.5)
WBC: 3.8 10*3/uL — ABNORMAL LOW (ref 4.0–10.5)
nRBC: 0 % (ref 0.0–0.2)
nRBC: 0 /100 WBC

## 2018-10-04 LAB — URINALYSIS, ROUTINE W REFLEX MICROSCOPIC
Bilirubin Urine: NEGATIVE
Glucose, UA: NEGATIVE mg/dL
Ketones, ur: NEGATIVE mg/dL
Nitrite: NEGATIVE
Protein, ur: 100 mg/dL — AB
RBC / HPF: >50 RBC/hpf — ABNORMAL HIGH (ref 0–5)
Specific Gravity, Urine: 1.017 (ref 1.005–1.030)
pH: 6 (ref 5.0–8.0)

## 2018-10-04 LAB — LACTIC ACID, PLASMA
Lactic Acid, Venous: 1.2 mmol/L (ref 0.5–1.9)
Lactic Acid, Venous: 1.1 mmol/L (ref 0.5–1.9)

## 2018-10-04 MED ORDER — HYDROCODONE-ACETAMINOPHEN 5-325 MG PO TABS
2.0000 | ORAL_TABLET | Freq: Once | ORAL | Status: AC
  Administered 2018-10-04: 2 via ORAL
  Filled 2018-10-04: qty 2

## 2018-10-04 MED ORDER — SODIUM CHLORIDE 0.9% FLUSH: 3.0000 mL | Freq: Once | INTRAVENOUS | Status: DC

## 2018-10-04 MED ORDER — HYDROCODONE-ACETAMINOPHEN 5-325 MG PO TABS: 1.0000 | ORAL_TABLET | Freq: Four times a day (QID) | ORAL | 0 refills | Status: DC | PRN

## 2018-10-04 MED ORDER — SODIUM CHLORIDE 0.9 % IV BOLUS
1000.0000 mL | Freq: Once | INTRAVENOUS | Status: AC
  Administered 2018-10-04: 1000 mL via INTRAVENOUS

## 2018-10-04 NOTE — ED Triage Notes (Signed)
Pt having left flank pain and lower abdominal pressure and pain. Pt has hx of recurrent UTI. Pt has nephrostomy tubes and feels that one has moved. Pt also having rectal or urethral foul smelling bloody discharge that is saturating 3 pads a day. Pt has history of breast, and ovarian cancer.

## 2018-10-04 NOTE — Telephone Encounter (Signed)
Patient called crying today stating she had to cancel her appointment with Dr. Megan Salon tomorrow 10/05/2018 because she is going to be admitted to the hospital for recurrent UTI's.  Patient is requesting Dr. Megan Salon see her in the hospital.  Informed her her Dr. can put in an ID consult if necessary while in the hospital.  Patient verbalized understanding. Pricilla Riffle RN

## 2018-10-04 NOTE — ED Notes (Signed)
Dinner tray ordered.

## 2018-10-04 NOTE — ED Notes (Signed)
First set of blood cultures drawn on pt and sent down to lab.

## 2018-10-04 NOTE — ED Notes (Signed)
Patient verbalizes understanding of discharge instructions. Opportunity for questioning and answers were provided. Armband removed by staff, pt discharged from ED via wheelchair to home.  

## 2018-10-04 NOTE — ED Notes (Signed)
vicodin given to pt, verified with [name]

## 2018-10-04 NOTE — ED Provider Notes (Addendum)
[facility] EMERGENCY DEPARTMENT Provider Note   CSN: [id number] Arrival date & time: 10/04/18  1259  History   Chief Complaint Chief Complaint  Patient presents with  . Flank Pain  . Urinary Tract Infection  . Fever   HPI [name] is a 63 y.o. female w/ PMH of ovarian cancer w/ recurrence s/p radiation and chemo, colovesical fistula with recurrent UTIs on prophylactic macrobid, chronic leukopenia, bilateral nephrostomies and ileostomy presenting with abdominal pain with fever. She was in her usual state of health until 7 days ago when she began to experience malaise, nausea, fevers and chills. She also noted significant foul smelling dark brown vaginal discharge and increasing cloudiness of urine in her nephrostomy output. She had significant worsening of her left flank pain as well as pelvic pain described as 'bladder spasms.' She also mentions that she had febrile episodes with most recent one at 103F measured at home this morning. She states this is a recurrent issue and she has had 4 admissions in the past for similar issues all showing infections with serratia marcescens. She states latest sensitivities showed susceptibility to ceftriaxone and her PCP gave her doses of ceftriaxone in the past as she has a PICC line but this time her PCP told her to go to the ED for evaluation. Denies any significant vomiting, light-headedness, dizziness.     Past Medical History:  Diagnosis Date  . Adrenal adenoma, left 02/08/2016   CT: stable benign  . Anemia in neoplastic disease   . Back pain   . Benign essential HTN   . 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 Cancer of corpus uteri, except isthmus [facility])  oncologist-- dr Denman [name] and dr Alvy Bimler    10-15-2004  dx endometroid endometrial and  ovarian cancer s/p  chemotheapy and surgery(TAH w/ BSO) :  recurrent 11-19-2014 post pelvic surgery and radiation 01-29-2015 to 03-10-2015  . Chronic idiopathic neutropenia (HCC)    presumed related to chemotherapy March 2006--- followed by dr Alvy Bimler (treatment w/ G-CSF injections  . Chronic nausea   . Chronic pain    perineal/ anal  area from bladder pad irritates skin , right flank pain  . CKD stage G2/A3, GFR 60-89 and albumin creatinine ratio >300 mg/g    nephrologist-  dr Madelon Lips  . Colovesical fistula   . Diabetic retinopathy, background ([place])   . Difficult intravenous access    small veins--- hx PICC lines  . DM type 2 (diabetes mellitus, type 2) ([place])    monitored by dr Legrand Como altheimer  . Dysuria   . Environmental and seasonal allergies   . Fatty liver 02/08/2016   CT  . Generalized muscle weakness   . GERD (gastroesophageal reflux disease)   . Hiatal hernia   . History of abdominal abscess 04/16/2017   post surgery 04-01-2017  --- resolved 10/ 2018  . History of gastric polyp    2014  duodenum  . History of ileus 04/16/2017   resolved w/ no surgical intervention  . History of radiation therapy    01-29-2015 to 03-10-2015  pelvis 50.4Gy  . Hypothyroidism    monitored by dr Legrand Como altheimer  . IBS (irritable bowel syndrome)   . Ileostomy in place [facility]) 04/01/2017   created at same time colostomy takedown.  . Joint pain   .  Leg edema   . Lower urinary tract symptoms (LUTS)    urge urinary  incontinence  . Mixed dyslipidemia   . Multiple thyroid nodules    Managed by Dr. Harlow Asa  . Nephrostomy status ([place])   . Palpitations   . Pelvic abscess in female 04/16/2017  . PONV (postoperative nausea and vomiting)    "scopolamine patch works for me"  . Radiation-induced dermatitis    contact dermatitis , radiation completed, rash only on ankles now.  . Seasonal allergies   . Ureteral stricture, right UROLOGIT-  DR [facility]   CHRONIC--  TREATMENT URETERAL STENT  .  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 Vitamin D deficiency   . Wears glasses     Patient Active Problem List   Diagnosis Date Noted  . Other chronic postprocedural pain 05/25/2018  . Malignant neoplasm of uterus ([place]) 05/25/2018  . Recurrent UTI 03/08/2018  . Acute pyelonephritis 11/29/2017  . HLD (hyperlipidemia) 11/29/2017  . GERD (gastroesophageal reflux disease) 11/29/2017  . Type II diabetes mellitus with renal manifestations ([place]) 11/29/2017  . Encounter for palliative care   . Goals of care, counseling/discussion   . Poor venous access   . Acute renal failure superimposed on chronic kidney disease ([place]) 10/02/2017  . Decubitus ulcer of sacral region, stage 2 ([place]) 10/02/2017  . Metabolic acidosis 16/05/9603  . MDD (major depressive disorder), single episode, moderate ([place])   . Poorly controlled diabetes mellitus ([place]) 08/31/2017  . Pressure injury of skin 08/31/2017  . 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 History of external beam radiation therapy to pelvis 08/31/2017  . ARF (acute renal failure) ([place]) 08/30/2017  . Adjustment disorder with mixed anxiety and depressed mood 08/30/2017  . Dehydration 08/29/2017  . CKD (chronic kidney disease), stage III ([place]) 08/24/2017  . Wound of right buttock 08/10/2017  . 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[place]) 06/20/2017  . Protein-calorie malnutrition, moderate ([place]) 04/18/2017  . Postoperative anemia 04/04/2017  . 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 Hot flashes related to aromatase inhibitor therapy 03/18/2017  . 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 Vitamin D insufficiency 08/30/2016  . Breast cancer of upper-inner quadrant of left female breast ([place])   . Pelvic pain in female 03/19/2015  . Chronic anemia 12/30/2014  . UTI (urinary tract  infection)   . Pancytopenia, acquired ([place]) 11/26/2014  . Morbid obesity ([place]) 11/20/2014  . 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 Abdominal pain 09/09/2014  . Postmenopausal bleeding 09/05/2014  . History of ovarian & endometrial cancer 07/17/2012  . 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[place]) 05/10/2012  . Chronic neutropenia ([place]) 12/14/2011  . Primary hypothyroidism 12/14/2011  . DM type 2 (diabetes mellitus, type 2) ([place]) 12/14/2011  . Benign essential HTN 12/14/2011    Past Surgical History:  Procedure Laterality Date  . APPENDECTOMY    . biopsy thyroid nodules    . BREAST RECONSTRUCTION WITH PLACEMENT OF TISSUE EXPANDER AND FLEX HD (ACELLULAR HYDRATED DERMIS) Bilateral 12/11/2015   Procedure: BILATERAL BREAST RECONSTRUCTION WITH PLACEMENT OF TISSUE EXPANDERS;  Surgeon: Irene Limbo, MD;  Location: [place];  Service: Plastics;  Laterality: Bilateral;  . COLONOSCOPY WITH PROPOFOL N/A 08/21/2013   Procedure: COLONOSCOPY WITH PROPOFOL;  Surgeon: Cleotis Nipper, MD;  [facility];  Service: Endoscopy;  Laterality: N/A;  . COLOSTOMY TAKEDOWN N/A 12/04/2014  Procedure: LAPROSCOPIC LYSIS OF ADHESIONS, SPLENIC MOBILIZATION, RELOCATION OF COLOSTOMY, DEBRIDEMENT INITIAL COLOSTOMY SITE;  Surgeon: Michael [name], MD;  [facility];  Service: General;  Laterality: N/A;  . CYSTOGRAM N/A 06/01/2017   Procedure: CYSTOGRAM;  Surgeon: Alexis Frock, MD;  [facility];  Service: Urology;  Laterality: N/A;  . CYSTOSCOPY W/ RETROGRADES Right 11/21/2015   Procedure: CYSTOSCOPY WITH RETROGRADE PYELOGRAM;  Surgeon: Alexis Frock, MD;  [facility];  Service: Urology;  Laterality: Right;  . CYSTOSCOPY W/ URETERAL STENT PLACEMENT Right 11/21/2015   Procedure: CYSTOSCOPY WITH STENT REPLACEMENT;  Surgeon: Alexis Frock, MD;  [facility];  Service: Urology;  Laterality: Right;  . CYSTOSCOPY W/  URETERAL STENT PLACEMENT Right 03/10/2016   Procedure: CYSTOSCOPY WITH STENT REPLACEMENT;  Surgeon: Alexis Frock, MD;  Location: [facility];  Service: Urology;  Laterality: Right;  . CYSTOSCOPY W/ URETERAL STENT PLACEMENT Right 06/30/2016   Procedure: CYSTOSCOPY WITH RETROGRADE PYELOGRAM/URETERAL STENT EXCHANGE;  Surgeon: Alexis Frock, MD;  Location: [facility];  Service: Urology;  Laterality: Right;  . CYSTOSCOPY W/ URETERAL STENT PLACEMENT N/A 06/01/2017   Procedure: CYSTOSCOPY WITH EXAM UNDER ANESTHESIA;  Surgeon: Alexis Frock, MD;  [facility];  Service: Urology;  Laterality: N/A;  . CYSTOSCOPY W/ URETERAL STENT PLACEMENT Right 08/17/2017   Procedure: CYSTOSCOPY WITH RETROGRADE PYELOGRAM/URETERAL STENT REMOVAL;  Surgeon: Alexis Frock, MD;  Location[facility];  Service: Urology;  Laterality: Right;  . CYSTOSCOPY WITH RETROGRADE PYELOGRAM, URETEROSCOPY AND STENT PLACEMENT Right 03/20/2015   Procedure: CYSTOSCOPY WITH RETROGRADE PYELOGRAM, URETEROSCOPY WITH BALLOON DILATION AND STENT PLACEMENT ON RIGHT;  Surgeon: Alexis Frock, MD;  [facility];  Service: Urology;  Laterality: Right;  . CYSTOSCOPY WITH RETROGRADE PYELOGRAM, URETEROSCOPY AND STENT PLACEMENT Right 05/02/2015   Procedure: CYSTOSCOPY WITH RIGHT RETROGRADE PYELOGRAM,  DIAGNOSTIC URETEROSCOPY AND STENT PULL ;  Surgeon: Alexis Frock, MD;  Location[facility];  Service: Urology;  Laterality: Right;  . CYSTOSCOPY WITH RETROGRADE PYELOGRAM, URETEROSCOPY AND STENT PLACEMENT Right 09/05/2015   Procedure: CYSTOSCOPY WITH RETROGRADE PYELOGRAM,  AND STENT PLACEMENT;  Surgeon: Alexis Frock, MD;  [facility];  Service: Urology;  Laterality: Right;  . CYSTOSCOPY WITH RETROGRADE PYELOGRAM, URETEROSCOPY AND STENT PLACEMENT Right 04/01/2017   Procedure: CYSTOSCOPY WITH RETROGRADE PYELOGRAM, URETEROSCOPY AND STENT PLACEMENT;  Surgeon: Alexis Frock, MD;  [facility];  Service: Urology;  Laterality: Right;  . CYSTOSCOPY WITH STENT PLACEMENT Right 10/27/2016   Procedure: CYSTOSCOPY WITH STENT CHANGE and right retrograde pyelogram;  Surgeon: Alexis Frock, MD;  Location: [facility];  Service: Urology;  Laterality: Right;  . EUS N/A 10/02/2014   Procedure: LOWER ENDOSCOPIC ULTRASOUND (EUS);  Surgeon: Arta Silence, MD;  Location[facility];  Service: Endoscopy;  Laterality: N/A;  . EXCISION SOFT TISSUE MASS RIGHT FOREMAN  12-08-2006  . EYE SURGERY  as child   pytosis of eyelids repair  . INCISION AND DRAINAGE OF WOUND Bilateral 12/26/2015   Procedure: DEBRIDEMENT OF BILATERAL MASTECTOMY FLAPS;  Surgeon: Irene Limbo, MD;  Location: [facility];  Service: Plastics;  Laterality: Bilateral;  . IR CV LINE INJECTION  05/31/2017  . IR FLUORO GUIDE CV LINE LEFT  05/31/2017  . IR FLUORO GUIDE CV LINE RIGHT  04/06/2017  . IR FLUORO GUIDE CV MIDLINE PICC RIGHT  05/30/2017  . IR NEPHROSTOGRAM LEFT INITIAL PLACEMENT  09/02/2017  . IR NEPHROSTOGRAM LEFT THRU EXISTING ACCESS  11/29/2017  . IR NEPHROSTOGRAM RIGHT INITIAL PLACEMENT  09/02/2017  . IR NEPHROSTOGRAM RIGHT THRU EXISTING ACCESS  09/13/2017  . IR NEPHROSTOGRAM RIGHT THRU EXISTING ACCESS  11/29/2017  . IR NEPHROSTOMY EXCHANGE LEFT  11/28/2017  . IR NEPHROSTOMY EXCHANGE LEFT  01/05/2018  . IR NEPHROSTOMY EXCHANGE LEFT  02/16/2018  . IR NEPHROSTOMY EXCHANGE LEFT  03/30/2018  . IR NEPHROSTOMY EXCHANGE LEFT  05/12/2018  . IR NEPHROSTOMY EXCHANGE LEFT  06/21/2018  . IR NEPHROSTOMY EXCHANGE LEFT  08/04/2018  . IR NEPHROSTOMY EXCHANGE LEFT  09/18/2018  . IR NEPHROSTOMY EXCHANGE RIGHT  10/02/2017  . IR NEPHROSTOMY EXCHANGE RIGHT  11/28/2017  . IR NEPHROSTOMY EXCHANGE RIGHT  01/05/2018  . IR NEPHROSTOMY EXCHANGE RIGHT  02/16/2018  . IR NEPHROSTOMY EXCHANGE RIGHT  03/30/2018  . IR NEPHROSTOMY EXCHANGE RIGHT  05/12/2018  . IR NEPHROSTOMY EXCHANGE RIGHT  06/21/2018  . IR  NEPHROSTOMY EXCHANGE RIGHT  08/04/2018  . IR NEPHROSTOMY EXCHANGE RIGHT  09/18/2018  . IR NEPHROSTOMY PLACEMENT LEFT  10/02/2017  . IR RADIOLOGIST EVAL & MGMT  05/03/2017  . IR US GUIDE VASC ACCESS LEFT  05/31/2017  . IR US GUIDE VASC ACCESS RIGHT  04/06/2017  . IR US GUIDE VASC ACCESS RIGHT  05/30/2017  . LAPAROSCOPIC CHOLECYSTECTOMY  1990  . LIPOSUCTION WITH LIPOFILLING Bilateral 04/16/2016   Procedure: LIPOSUCTION WITH LIPOFILLING TO BILATERAL CHEST;  Surgeon: Irene Limbo, MD;  Location: [place];  Service: Plastics;  Laterality: Bilateral;  . MASTECTOMY W/ SENTINEL NODE BIOPSY Bilateral 12/11/2015   Procedure: RIGHT PROPHYLACTIC MASTECTOMY, LEFT TOTAL MASTECTOMY WITH LEFT AXILLARY SENTINEL LYMPH NODE BIOPSY;  Surgeon: Stark Klein, MD;  Location: [place];  Service: General;  Laterality: Bilateral;  . OSTOMY N/A 11/19/2014   Procedure: OSTOMY;  Surgeon: Michael [name], MD;  [facility];  Service: General;  Laterality: N/A;  . PROCTOSCOPY N/A 04/01/2017   Procedure: RIDGE PROCTOSCOPY;  Surgeon: Michael [name], MD;  [facility];  Service: General;  Laterality: N/A;  . REMOVAL OF BILATERAL TISSUE EXPANDERS WITH PLACEMENT OF BILATERAL BREAST IMPLANTS Bilateral 04/16/2016   Procedure: REMOVAL OF BILATERAL TISSUE EXPANDERS WITH PLACEMENT OF BILATERAL BREAST IMPLANTS;  Surgeon: Irene Limbo, MD;  Location: [place];  Service: Plastics;  Laterality: Bilateral;  . ROBOTIC ASSISTED LAP VAGINAL HYSTERECTOMY N/A 11/19/2014   Procedure: ROBOTIC LYSIS OF ADHESIONS, CONVERTED TO LAPAROTOMY RADICAL UPPER VAGINECTOMY,LOW ANTERIOR BOWEL RESECTION, COLOSTOMY, BILATERAL URETERAL STENT PLACEMENT AND CYSTONOMY CLOSURE;  Surgeon: Everitt Amber, MD;  [facility];  Service: Gynecology;  Laterality: N/A;  . TISSUE EXPANDER FILLING Bilateral 12/26/2015   Procedure: EXPANSION OF BILATERAL CHEST TISSUE EXPANDERS (60 mL- Right; 75 mL- Left);  Surgeon: Irene Limbo, MD;  Location:  [place];  Service: Plastics;  Laterality: Bilateral;  . TONSILLECTOMY    . TOTAL ABDOMINAL HYSTERECTOMY  March 2006   [facility]   and Bilateral Salpingoophorectomy/  staging for Ovarian cancer/  an  . XI ROBOTIC ASSISTED LOWER ANTERIOR RESECTION N/A 04/01/2017   Procedure: XI ROBOTIC VS LAPAROSCOPIC COLOSTOMY TAKEDOWN WITH LYSIS OF ADHESIONS.;  Surgeon: Michael [name], MD;  [facility];  Service: General;  Laterality: N/A;  ERAS PATHWAY     OB History    Gravida  1   Para      Term      Preterm      AB      Living        SAB      TAB      Ectopic  Multiple      Live Births             Home Medications    Prior to Admission medications   Medication Sig Start Date End Date Taking? Authorizing Provider  acetaminophen (TYLENOL) 325 MG tablet Take 2 tablets (650 mg total) by mouth every 6 (six) hours as needed. 09/24/17   Roxan Hockey, MD  anastrozole (ARIMIDEX) 1 MG tablet TAKE 1 TABLET DAILY Patient taking differently: Take 1 mg by mouth daily.  01/30/18   Heath Lark, MD  Biotin 5 MG TABS Take 5 mg by mouth every morning.     [provider]  buPROPion (WELLBUTRIN SR) 200 MG 12 hr tablet Take 1 tablet (200 mg total) by mouth daily. 10/03/18   Dennard Nip D, MD  Cholecalciferol (VITAMIN D3) 10000 UNITS capsule Take 10,000 Units by mouth once a week. Sunday evening's    [provider]  clobetasol (OLUX) 0.05 % topical foam Apply topically 2 (two) times daily.    [provider]  diphenhydrAMINE (BENADRYL) 25 mg capsule Take 1 capsule (25 mg total) by mouth every 8 (eight) hours as needed for itching, allergies or sleep. 09/24/17   Roxan Hockey, MD  diphenoxylate-atropine (LOMOTIL) 2.5-0.025 MG tablet Take 1 tablet by mouth 4 (four) times daily as needed for diarrhea or loose stools. TO PREVENT LOOSE BOWEL MOVEMENTS Patient taking differently: Take 1 tablet by mouth every other day. TO PREVENT LOOSE BOWEL MOVEMENTS  09/24/17   Roxan Hockey, MD  fentaNYL (DURAGESIC) 50 MCG/HR Place 1 patch onto the skin every 3 (three) days. 09/18/18   [name] R, DO  ferrous sulfate 325 (65 FE) MG tablet Take 1 tablet (325 mg total) by mouth at bedtime. 09/24/17   Roxan Hockey, MD  HYDROcodone-acetaminophen (NORCO/VICODIN) 5-325 MG tablet Take 1 tablet by mouth every 6 (six) hours as needed for moderate pain. 08/22/18   Lowne Chase, Yvonne R, DO  JANUVIA 50 MG tablet Take 50 mg by mouth daily.  10/28/17   [provider]  levothyroxine (SYNTHROID, LEVOTHROID) 150 MCG tablet Take 1 tablet (150 mcg total) by mouth daily before breakfast. 09/25/17   Roxan Hockey, MD  loratadine (CLARITIN) 10 MG tablet Take 10 mg by mouth every morning.     [provider]  nitrofurantoin, macrocrystal-monohydrate, (MACROBID) 100 MG capsule Take 100 mg by mouth at bedtime.    [provider]  omega-3 acid ethyl esters (LOVAZA) 1 G capsule Take 1 g by mouth 2 (two) times daily.     [provider]  Polyethyl Glycol-Propyl Glycol (SYSTANE OP) Place 1 drop into both eyes daily as needed (dry eyes).     [provider]  pregabalin (LYRICA) 50 MG capsule TAKE 1 CAPSULE(50 MG) BY MOUTH TWICE DAILY 10/02/18   Ann Held, DO  Prenatal Vit-Fe Fumarate-FA (PRENATAL VITAMIN PO) Take 1 capsule by mouth daily. Takes prenatal because there are no dyes in it    [provider]  rosuvastatin (CRESTOR) 10 MG tablet Take 10 mg by mouth every evening.     [provider]  Saccharomyces boulardii (FLORASTOR PO) Take 1 capsule by mouth daily. 02/16/18   [provider]  sodium bicarbonate 650 MG tablet Take 650 mg by mouth 2 (two) times daily.    [provider]  sodium chloride 0.9 % injection Inject 10 mLs into the vein daily.    [provider]  Tbo-Filgrastim Galen Daft) 480 MCG/0.8ML SOSY injection Inject  0.8 mLs (480 mcg total) into the skin  daily. Patient taking differently: Inject 480 mcg into the skin See admin instructions. Every 6 days 11/28/17   Heath Lark, MD    Family History Family History  Problem Relation Age of Onset  . Cancer Mother 87       stomach ca  . Hypertension Mother   . Cancer Father 7       prostate ca  . Diabetes Father   . Heart disease Father        CABG  . Hypertension Father   . Hyperlipidemia Father   . Obesity Father   . Breast cancer Maternal Aunt        dx in her 72s  . Lymphoma Paternal Aunt   . Brain cancer Paternal Grandfather   . Ovarian cancer Other   . Diabetes Sister   . Hypertension Brother y-10  . Heart disease Brother        CABG  . Diabetes Brother    Social History Social History   Tobacco Use  . Smoking status: Never Smoker  . Smokeless tobacco: Never Used  Substance Use Topics  . Alcohol use: Not Currently  . Drug use: No   Allergies   Penicillins; Cefaclor; Erythromycin; Tape; Trimethoprim; Ultram [tramadol]; Cephalosporins; Fluconazole; Oxycodone; Pectin; Septra [sulfamethoxazole-trimethoprim]; and Sulfa antibiotics  Review of Systems Review of Systems  Constitutional: Positive for chills, fatigue and fever. Negative for diaphoresis and unexpected weight change.  Respiratory: Negative for cough and shortness of breath.   Cardiovascular: Negative for chest pain, palpitations and leg swelling.  Gastrointestinal: Positive for abdominal pain and nausea. Negative for vomiting.  Genitourinary: Positive for flank pain, frequency, pelvic pain, urgency and vaginal discharge.  Neurological: Negative for dizziness, light-headedness and headaches.    Physical Exam Updated Vital Signs BP (!) 106/47   Pulse 68   Temp 98.1 F (36.7 C)   Resp (!) 22   Ht 5' 2" (1.575 m)   Wt 86.2 kg   SpO2 96%   BMI 34.75 kg/m   Physical Exam Constitutional:      General: She is in acute distress.     Appearance: She is obese. She is ill-appearing. She is not  toxic-appearing or diaphoretic.  HENT:     Mouth/Throat:     Mouth: Mucous membranes are dry.     Pharynx: Oropharynx is clear. No oropharyngeal exudate.  Cardiovascular:     Rate and Rhythm: Normal rate and regular rhythm.     Pulses: Normal pulses.     Heart sounds: Normal heart sounds. No murmur.  Abdominal:     General: Abdomen is flat. Bowel sounds are normal.     Palpations: Abdomen is soft.     Tenderness: There is abdominal tenderness (suprapubic tenderness to palpation).  Genitourinary:    Vagina: Vaginal discharge (dark, brownish foul smelling discharge from vagina) present.     Comments: Bilateral nephrostomy bags with turbid urine Musculoskeletal: Normal range of motion.        General: No swelling.     Right lower leg: No edema.     Left lower leg: No edema.  Skin:    General: Skin is warm and dry.  Neurological:     Mental Status: She is alert and oriented to person, place, and time. Mental status is at baseline.    ED Treatments / Results  Labs (all labs ordered are listed, but only abnormal results are displayed) Labs Reviewed  COMPREHENSIVE METABOLIC PANEL -  Abnormal; Notable for the following components:      Result Value   CO2 20 (*)    Glucose, Bld 132 (*)    BUN 36 (*)    Creatinine, Ser 1.94 (*)    Total Protein 8.4 (*)    Albumin 3.0 (*)    ALT 55 (*)    Alkaline Phosphatase 550 (*)    GFR calc non Af Amer 27 (*)    GFR calc Af Amer 31 (*)    All other components within normal limits  CBC WITH DIFFERENTIAL/PLATELET - Abnormal; Notable for the following components:   WBC 3.8 (*)    RBC 3.80 (*)    Hemoglobin 11.2 (*)    All other components within normal limits  URINALYSIS, ROUTINE W REFLEX MICROSCOPIC - Abnormal; Notable for the following components:   APPearance HAZY (*)    Hgb urine dipstick LARGE (*)    Protein, ur 100 (*)    Leukocytes,Ua MODERATE (*)    RBC / HPF >50 (*)    Bacteria, UA FEW (*)    All other components within normal  limits  URINE CULTURE  CULTURE, BLOOD (ROUTINE X 2)  CULTURE, BLOOD (ROUTINE X 2)  URINE CULTURE  LACTIC ACID, PLASMA  LACTIC ACID, PLASMA   EKG None  Radiology Ct Abdomen Pelvis Wo Contrast  Result Date: 10/04/2018 CLINICAL DATA:  63 year old female with history of left-sided flank pain and lower abdominal pain and pressure. History of recurrent urinary tract infections. History of endometrial cancer. EXAM: CT ABDOMEN AND PELVIS WITHOUT CONTRAST TECHNIQUE: Multidetector CT imaging of the abdomen and pelvis was performed following the standard protocol without IV contrast. COMPARISON:  CT the abdomen and pelvis 02/01/2018. FINDINGS: Lower chest: Bilateral breast implants incidentally noted. Hepatobiliary: No definite cystic or solid hepatic lesions are confidently identified on today's noncontrast CT examination. Status post cholecystectomy. Common bile duct is dilated measuring 12 mm in the porta hepatis, likely reflective of benign post cholecystectomy physiology. Pancreas: No definite pancreatic mass or peripancreatic fluid or inflammatory changes are noted on today's noncontrast CT examination. Spleen: Unremarkable. Adrenals/Urinary Tract: Percutaneous nephrostomy tubes are noted in the collecting systems of both kidneys which appear decompressed. In the lower pole of the left kidney there is a 4 mm nonobstructive calculus. No definite renal lesions are confidently identified on today's noncontrast CT examination. No hydroureteronephrosis. Urinary bladder is decompressed around an indwelling Foley balloon catheter. Right adrenal gland is normal. 1.9 x 1.4 cm intermediate attenuation left adrenal nodule, not characterized, but stable compared to prior studies dating back to 2016, presumably a lipid poor adenoma or other benign lesion. Stomach/Bowel: Unenhanced appearance of the stomach is normal. No pathologic dilatation of small bowel or colon. Right upper quadrant loop ileostomy and low anterior  resection. Status post appendectomy. Vascular/Lymphatic: No atherosclerotic calcifications noted in the abdominal aorta or pelvic vasculature. No lymphadenopathy noted in the abdomen or pelvis. Surgical clips are noted along the pelvic sidewall bilaterally, presumably from prior and lymph node dissection. Reproductive: Status post hysterectomy. Ovaries are not confidently identified may be surgically absent or atrophic Other: No significant volume of ascites.  No pneumoperitoneum. Musculoskeletal: There are no aggressive appearing lytic or blastic lesions noted in the visualized portions of the skeleton. IMPRESSION: 1. No definite acute finding noted in the abdomen or pelvis to account for the patient's symptoms. 2. 4 mm nonobstructive calculus in the lower pole collecting system of left kidney. No ureteral stones or findings of urinary tract obstruction are  noted at this time. 3. Chronic postoperative and procedural changes in the abdomen and pelvis, similar to the prior study, as above. 4. Additional incidental findings, as above Electronically Signed   By: Vinnie Langton M.D.   On: 10/04/2018 18:37   Dg Chest 2 View  Result Date: 10/04/2018 CLINICAL DATA:  63 year old female with left flank pain come abdominal pressure and pain, fever. EXAM: CHEST - 2 VIEW COMPARISON:  Chest radiographs 11/29/2017 and earlier. FINDINGS: Right PICC line in place. Stable left axillary surgical clips. Lung volumes and mediastinal contours are within normal limits. Visualized tracheal air column is within normal limits. Both lungs appear clear. No pneumothorax or pleural effusion. No pneumoperitoneum. Paucity of bowel gas in the upper abdomen. No acute osseous abnormality identified. IMPRESSION: Negative.  No acute cardiopulmonary abnormality. Electronically Signed   By: Genevie Ann M.D.   On: 10/04/2018 14:56   Procedures Procedures (including critical care time)  Medications Ordered in ED Medications  sodium chloride flush  (NS) 0.9 % injection 3 mL (has no administration in time range)  HYDROcodone-acetaminophen (NORCO/VICODIN) 5-325 MG per tablet 2 tablet (2 tablets Oral Given 10/04/18 1554)  sodium chloride 0.9 % bolus 1,000 mL (1,000 mLs Intravenous New Bag/Given 10/04/18 1840)   Initial Impression / Assessment and Plan / ED Course  I have reviewed the triage vital signs and the nursing notes.  Pertinent labs & imaging results that were available during my care of the patient were reviewed by me and considered in my medical decision making (see chart for details).  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 Her 3 prior episodes all grew serratia marcescens. We will repeat collected urine/blood cultures and perform CT Abd/pelvis for development of any abscess formation.  CT abd/pelvis is showing 86m nephrolithiasis on left lower pole collecting system but no other acute abscess or dislodgement of nephrostomy tube. Discussed case with Dr.Comer with infectious disease who would like the patient to have supportive care now for now prior to initiating IV abx therapy due to hx of multiple courses of abx therapy and risk of developing resistance. Patient was able to tolerate dinner without significant nausea or vomiting. Agreeable to go home with pain control and close follow up with ID. States she has appointment with Dr.Campbell tomorrow. Will discharge home.  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 Vitals are stable. Afebrile  during stay in ED. Able to tolerate oral in take. Discharge for outpatient follow up with ID with pain control.  ED Discharge Orders    None       LMosetta Anis MD 10/04/18 1Pauline Aus   LMosetta Anis MD 10/04/18 2120    LMosetta Anis MD 10/04/18 26301   PBlanchie Dessert MD 10/06/18 1622

## 2018-10-04 NOTE — Discharge Instructions (Addendum)
Dear [name]  You came to [place] with flank pain. We have determined this is not caused by sepsis. Here are our recommendations for you at discharge:  Please follow up closely with your infectious disease doctor. Please take your pain medication only as needed for severe pain.  Thank you for choosing [facility]

## 2018-10-05 ENCOUNTER — Telehealth: Payer: Self-pay

## 2018-10-05 ENCOUNTER — Encounter (INDEPENDENT_AMBULATORY_CARE_PROVIDER_SITE_OTHER): Payer: Self-pay | Admitting: Family Medicine

## 2018-10-05 ENCOUNTER — Ambulatory Visit: Payer: Self-pay | Admitting: Internal Medicine

## 2018-10-05 ENCOUNTER — Ambulatory Visit: Payer: BLUE CROSS/BLUE SHIELD | Admitting: Internal Medicine

## 2018-10-05 NOTE — Telephone Encounter (Signed)
Per Dr. Megan Salon called patient to offer appointment today for [facility] at 2pm. Patient states she would prefer to hold off on appointment until urine cultures from ED visit are resulted. Patient is scheduled to come in on 3/9 at 4 pm. Aundria Rud, [place]

## 2018-10-06 DIAGNOSIS — L89319 Pressure ulcer of right buttock, unspecified stage: Secondary | ICD-10-CM | POA: Diagnosis not present

## 2018-10-06 DIAGNOSIS — Z933 Colostomy status: Secondary | ICD-10-CM | POA: Diagnosis not present

## 2018-10-06 LAB — URINE CULTURE
Culture: >=100000 — AB
Culture: >=100000 — AB

## 2018-10-07 ENCOUNTER — Telehealth: Payer: Self-pay | Admitting: Emergency Medicine

## 2018-10-07 NOTE — Telephone Encounter (Signed)
Post ED Visit - Positive Culture Follow-up  Culture report reviewed by antimicrobial stewardship pharmacist: [facility] Team []  Elenor Quinones, Pharm.D. []  Heide Guile, Pharm.D., BCPS AQ-ID []  Parks Neptune, Pharm.D., BCPS []  Alycia Rossetti, Pharm.D., BCPS [x]  [place], [place].D., BCPS, AAHIVP []  Legrand Como, Pharm.D., BCPS, AAHIVP []  Salome Arnt, PharmD, BCPS []  Johnnette Gourd, PharmD, BCPS []  Hughes Better, PharmD, BCPS []  Leeroy Cha, PharmD []  Laqueta Linden, PharmD, BCPS []  Albertina Parr, PharmD  [place] Team []  Leodis Sias, PharmD []  Lindell Spar, PharmD []  Royetta Asal, PharmD []  Graylin Shiver, Rph []  Rema Fendt) Glennon Mac, PharmD []  Arlyn Dunning, PharmD []  Netta Cedars, PharmD []  Dia Sitter, PharmD []  Leone Haven, PharmD []  Gretta Arab, PharmD []  Theodis Shove, PharmD []  Peggyann Juba, PharmD []  Reuel Boom, PharmD   Positive urine culture No further patient follow-up is required at this time.  Larene Beach Tyashia Morrisette 10/07/2018, 2:15 PM

## 2018-10-09 ENCOUNTER — Other Ambulatory Visit: Payer: Self-pay | Admitting: Radiology

## 2018-10-09 ENCOUNTER — Ambulatory Visit: Payer: BLUE CROSS/BLUE SHIELD | Admitting: Internal Medicine

## 2018-10-09 ENCOUNTER — Encounter (HOSPITAL_COMMUNITY): Payer: Self-pay | Admitting: Interventional Radiology

## 2018-10-09 ENCOUNTER — Telehealth: Payer: Self-pay | Admitting: Pharmacist

## 2018-10-09 ENCOUNTER — Other Ambulatory Visit (HOSPITAL_COMMUNITY): Payer: Self-pay | Admitting: Radiology

## 2018-10-09 ENCOUNTER — Ambulatory Visit (HOSPITAL_COMMUNITY)
Admission: RE | Admit: 2018-10-09 | Discharge: 2018-10-09 | Disposition: A | Discharge status: Home / Self Care | Payer: BLUE CROSS/BLUE SHIELD | Source: Ambulatory Visit | Attending: Radiology | Admitting: Radiology

## 2018-10-09 DIAGNOSIS — C541 Malignant neoplasm of endometrium: Secondary | ICD-10-CM

## 2018-10-09 DIAGNOSIS — N3000 Acute cystitis without hematuria: Secondary | ICD-10-CM

## 2018-10-09 DIAGNOSIS — T83092A Other mechanical complication of nephrostomy catheter, initial encounter: Secondary | ICD-10-CM | POA: Diagnosis not present

## 2018-10-09 DIAGNOSIS — R109 Unspecified abdominal pain: Secondary | ICD-10-CM

## 2018-10-09 DIAGNOSIS — R52 Pain, unspecified: Secondary | ICD-10-CM | POA: Diagnosis not present

## 2018-10-09 DIAGNOSIS — Z436 Encounter for attention to other artificial openings of urinary tract: Secondary | ICD-10-CM | POA: Insufficient documentation

## 2018-10-09 HISTORY — PX: IR NEPHROSTOMY EXCHANGE LEFT: IMG6069

## 2018-10-09 LAB — CULTURE, BLOOD (ROUTINE X 2)
Culture: NO GROWTH
Special Requests: ADEQUATE

## 2018-10-09 MED ORDER — CEFTRIAXONE SODIUM 1 G IJ SOLR: 1.0000 g | INTRAMUSCULAR | 0 refills | Status: DC

## 2018-10-09 MED ORDER — CEFTRIAXONE SODIUM 250 MG IJ SOLR: 1.0000 g | Freq: Once | INTRAMUSCULAR | Status: DC

## 2018-10-09 MED ORDER — CEFTRIAXONE SODIUM 250 MG IJ SOLR
500.0000 mg | Freq: Once | INTRAMUSCULAR | Status: AC
  Administered 2018-10-09: 500 mg via INTRAMUSCULAR

## 2018-10-09 MED ORDER — CEFTRIAXONE SODIUM 1 G IJ SOLR: 1.0000 g | INTRAMUSCULAR | Status: DC

## 2018-10-09 MED ORDER — IOHEXOL 300 MG/ML  SOLN
50.0000 mL | Freq: Once | INTRAMUSCULAR | Status: AC | PRN
  Administered 2018-10-09: 15 mL

## 2018-10-09 NOTE — Progress Notes (Signed)
[facility] for Infectious Disease  Reason for Consult: Urinary tract infection Referring Provider: Dr. 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  She has had Serratia growing consistently from her urine over the past year.  She has multiple antibiotic allergies.  She tolerated ceftriaxone well and had an excellent clinical response to therapy last November.  Plan: 1. Ceftriaxone 1 g IM now followed by 1 g IV daily for 7 days starting tomorrow 2. Follow-up in 3 weeks  Patient Active Problem List   Diagnosis Date Noted  . 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 UTI (urinary tract infection)     Priority: High  . Other chronic postprocedural pain 05/25/2018  . Malignant neoplasm of uterus ([place]) 05/25/2018  . Recurrent UTI 03/08/2018  . Acute pyelonephritis 11/29/2017  . HLD (hyperlipidemia) 11/29/2017  . GERD (gastroesophageal reflux disease) 11/29/2017  . Type II diabetes mellitus with renal manifestations ([place]) 11/29/2017  . Encounter for palliative care   . Goals of care, counseling/discussion   . Poor venous access   . Acute renal failure superimposed on chronic kidney disease ([place]) 10/02/2017  . Decubitus ulcer of sacral region, stage 2 ([place]) 10/02/2017  . Metabolic acidosis 82/95/6213  . MDD (major depressive disorder), single episode, moderate ([place])   . Poorly controlled diabetes mellitus ([place]) 08/31/2017  . Pressure injury of skin 08/31/2017  . 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 History of external beam radiation therapy to pelvis 08/31/2017  . ARF (acute renal failure) ([place]) 08/30/2017  . Adjustment disorder with mixed anxiety and depressed mood 08/30/2017  . Dehydration 08/29/2017  . CKD (chronic kidney disease), stage III ([place]) 08/24/2017  . Wound of  right buttock 08/10/2017  . 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[place]) 06/20/2017  . Protein-calorie malnutrition, moderate ([place]) 04/18/2017  . Postoperative anemia 04/04/2017  . Hot flashes related to aromatase inhibitor therapy 03/18/2017  . 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 Vitamin D insufficiency 08/30/2016  . Breast cancer of upper-inner quadrant of left female breast ([place])   . Pelvic pain in female 03/19/2015  . Chronic anemia 12/30/2014  . Pancytopenia, acquired ([place]) 11/26/2014  . Morbid obesity ([place]) 11/20/2014  . 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 Abdominal pain 09/09/2014  . Postmenopausal bleeding 09/05/2014  . History of ovarian & endometrial cancer 07/17/2012  . 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[place]) 05/10/2012  . Chronic neutropenia ([place]) 12/14/2011  . Primary hypothyroidism 12/14/2011  . DM type 2 (diabetes mellitus, type 2) ([place]) 12/14/2011  . Benign essential HTN 12/14/2011    Patient's Medications  New Prescriptions   CEFTRIAXONE (ROCEPHIN) 1 G INJECTION    Inject 1 g into the muscle daily for 6 days.  Previous Medications   ACETAMINOPHEN (TYLENOL) 325 MG TABLET    Take 2 tablets (650 mg total) by mouth every 6 (six) hours as needed.   ANASTROZOLE (ARIMIDEX) 1 MG TABLET    TAKE 1 TABLET DAILY   BIOTIN 5 MG TABS    Take 5 mg by mouth every morning.    BUPROPION (WELLBUTRIN SR) 200 MG 12 HR TABLET    Take 1 tablet (200 mg total) by mouth daily.   CHOLECALCIFEROL (VITAMIN D3) 10000 UNITS  CAPSULE    Take 10,000 Units by mouth once a week. Sunday evening's   CLOBETASOL (OLUX) 0.05 % TOPICAL FOAM    Apply topically 2 (two) times daily.   DIPHENHYDRAMINE (BENADRYL) 25 MG CAPSULE    Take 1 capsule (25 mg total) by mouth every 8 (eight) hours as needed for itching, allergies or sleep.    DIPHENOXYLATE-ATROPINE (LOMOTIL) 2.5-0.025 MG TABLET    Take 1 tablet by mouth 4 (four) times daily as needed for diarrhea or loose stools. TO PREVENT LOOSE BOWEL MOVEMENTS   FENTANYL (DURAGESIC) 50 MCG/HR    Place 1 patch onto the skin every 3 (three) days.   FERROUS SULFATE 325 (65 FE) MG TABLET    Take 1 tablet (325 mg total) by mouth at bedtime.   HYDROCODONE-ACETAMINOPHEN (NORCO/VICODIN) 5-325 MG TABLET    Take 1 tablet by mouth every 6 (six) hours as needed for up to 5 days for moderate pain.   JANUVIA 50 MG TABLET    Take 50 mg by mouth daily.    LEVOTHYROXINE (SYNTHROID, LEVOTHROID) 150 MCG TABLET    Take 1 tablet (150 mcg total) by mouth daily before breakfast.   LORATADINE (CLARITIN) 10 MG TABLET    Take 10 mg by mouth every morning.    NITROFURANTOIN, MACROCRYSTAL-MONOHYDRATE, (MACROBID) 100 MG CAPSULE    Take 100 mg by mouth at bedtime.   OMEGA-3 ACID ETHYL ESTERS (LOVAZA) 1 G CAPSULE    Take 1 g by mouth 2 (two) times daily.    POLYETHYL GLYCOL-PROPYL GLYCOL (SYSTANE OP)    Place 1 drop into both eyes daily as needed (dry eyes).    PREGABALIN (LYRICA) 50 MG CAPSULE    TAKE 1 CAPSULE(50 MG) BY MOUTH TWICE DAILY   PRENATAL VIT-FE FUMARATE-FA (PRENATAL VITAMIN PO)    Take 1 capsule by mouth daily. Takes prenatal because there are no dyes in it   ROSUVASTATIN (CRESTOR) 10 MG TABLET    Take 10 mg by mouth every evening.    SACCHAROMYCES BOULARDII (FLORASTOR PO)    Take 1 capsule by mouth daily.   SODIUM BICARBONATE 650 MG TABLET    Take 650 mg by mouth 2 (two) times daily.   SODIUM CHLORIDE 0.9 % INJECTION    Inject 10 mLs into the vein daily.   TBO-FILGRASTIM (GRANIX) 480 MCG/0.8ML SOSY INJECTION    Inject 0.8 mLs (480 mcg total) into the skin daily.  Modified Medications   No medications on file  Discontinued Medications   No medications on file    HPI: [name] is a 63 y.o. female with a history of endometrial and ovarian carcinoma status post surgery, chemotherapy and  radiation therapy.  She has a chronic rectal vesicle fistula.  She has an ileostomy with very high output, bilateral percutaneous nephrostomy tubes and a chronic indwelling Foley catheter to manage her fistula drainage.  She has had recurrent urinary tract infections over the past year.  She has been on daily, suppressive nitrofurantoin for months. She began having very severe bladder spasms in January.  She then began to note some foul-smelling bloody discharge from around her Foley catheter.  Her percutaneous nephrostomy tubes were changed on 09/18/2018.  She developed left flank pain about 1 week later that has persisted.  5 to 6 days ago she began to have intermittent fever and was seen in the emergency department on 10/04/2018.    No acute abnormalities were noted on CT scan of her abdomen and pelvis or her  chest x-ray.  Blood cultures were done and urine cultures were obtained from both nephrostomy tubes.  She was discharged home without any change in her nitrofurantoin.  She was not given any other antibiotics in the emergency department.  She is continued to have intermittent fevers, bladder spasms and flank pain.  Yesterday she noted markedly diminished output from her left nephrostomy tube.  She had it exchanged this morning and her flank pain has improved.   Review of Systems: Review of Systems  Constitutional: Positive for chills, fever and malaise/fatigue. Negative for diaphoresis.  Respiratory: Negative for cough, sputum production and shortness of breath.   Cardiovascular: Negative for chest pain.  Gastrointestinal: Negative for nausea and vomiting.  Genitourinary: Positive for flank pain.    . cefTRIAXone (ROCEPHIN) IM  1 g Intramuscular Q24H    Past Medical History:  Diagnosis Date  . Adrenal adenoma, left 02/08/2016   CT: stable benign  . Anemia in neoplastic disease   . Back pain   . Benign essential HTN   . Breast cancer, left 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 Cancer of corpus uteri, except isthmus [facility])  oncologist-- dr Denman [name] and dr Alvy Bimler    10-15-2004  dx endometroid endometrial and ovarian cancer s/p  chemotheapy and surgery(TAH w/ BSO) :  recurrent 11-19-2014 post pelvic surgery and radiation 01-29-2015 to 03-10-2015  . Chronic idiopathic neutropenia (HCC)    presumed related to chemotherapy March 2006--- followed by dr Alvy Bimler (treatment w/ G-CSF injections  . Chronic nausea   . Chronic pain    perineal/ anal  area from bladder pad irritates skin , right flank pain  . CKD stage G2/A3, GFR 60-89 and albumin creatinine ratio >300 mg/g    nephrologist-  dr Madelon Lips  . Colovesical fistula   . Diabetic retinopathy, background ([place])   . Difficult intravenous access    small veins--- hx PICC lines  . DM type 2 (diabetes mellitus, type 2) ([place])    monitored by dr Legrand Como altheimer  . Dysuria   . Environmental and seasonal allergies   . Fatty liver 02/08/2016   CT  . Generalized muscle weakness   . GERD (gastroesophageal reflux disease)   . Hiatal hernia   . History of abdominal abscess 04/16/2017   post surgery 04-01-2017  --- resolved 10/ 2018  . History of gastric polyp    2014  duodenum  . History of ileus 04/16/2017   resolved w/ no surgical intervention  . History of radiation therapy    01-29-2015 to 03-10-2015  pelvis 50.4Gy  . Hypothyroidism    monitored by dr Legrand Como altheimer  . IBS (irritable bowel syndrome)   . Ileostomy in place [facility]) 04/01/2017   created at same time colostomy takedown.  . Joint pain   . Leg edema   . Lower urinary tract symptoms (LUTS)    urge urinary  incontinence  . Mixed dyslipidemia   . Multiple thyroid nodules    Managed by Dr. Harlow Asa  . Nephrostomy status ([place])   . Palpitations   . Pelvic abscess in female 04/16/2017  . PONV  (postoperative nausea and vomiting)    "scopolamine patch works for me"  . Radiation-induced dermatitis    contact dermatitis , radiation completed, rash only on ankles now.  . Seasonal allergies   .  Ureteral stricture, right UROLOGIT-  DR [facility]   CHRONIC--  TREATMENT URETERAL STENT  . 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 Vitamin D deficiency   . Wears glasses     Social History   Tobacco Use  . Smoking status: Never Smoker  . Smokeless tobacco: Never Used  Substance Use Topics  . Alcohol use: Not Currently  . Drug use: No    Family History  Problem Relation Age of Onset  . Cancer Mother 19       stomach ca  . Hypertension Mother   . Cancer Father 66       prostate ca  . Diabetes Father   . Heart disease Father        CABG  . Hypertension Father   . Hyperlipidemia Father   . Obesity Father   . Breast cancer Maternal Aunt        dx in her 21s  . Lymphoma Paternal Aunt   . Brain cancer Paternal Grandfather   . Ovarian cancer Other   . Diabetes Sister   . Hypertension Brother y-10  . Heart disease Brother        CABG  . Diabetes Brother    Allergies  Allergen Reactions  . Penicillins Swelling    Facial swelling/childhood allergy Has patient had a PCN reaction causing immediate rash, facial/tongue/throat swelling, SOB or lightheadedness with hypotension: Yes Has patient had a PCN reaction causing severe rash involving mucus membranes or skin necrosis: Yes Has patient had a PCN reaction that required hospitalization yes Has patient had a PCN reaction occurring within the last 10 years: No If all of the above answers are "NO", then may proceed with Cephalosporin use.   . Cefaclor Rash    Ceclor  . Erythromycin Other (See Comments)    Gastritis, abd cramps  . Tape Rash    blisters  . Trimethoprim Rash  . Ultram [Tramadol] Hives  . Cephalosporins Rash  . Fluconazole Rash  . Oxycodone Other (See Comments)    " I just feel weird"  . Pectin Rash     Pectin ring for stoma  . Septra [Sulfamethoxazole-Trimethoprim] Rash  . Sulfa Antibiotics Rash    OBJECTIVE: Vitals:   10/09/18 1610  BP: 119/76  Pulse: 77  Temp: 97.6 F (36.4 C)  Weight: 197 lb (89.4 kg)   Body mass index is 36.03 kg/m.   Physical Exam Constitutional:      Comments: She appears very uncomfortable.  She is accompanied by her husband.  Cardiovascular:     Rate and Rhythm: Normal rate and regular rhythm.     Heart sounds: No murmur.  Pulmonary:     Effort: Pulmonary effort is normal.     Breath sounds: Normal breath sounds.  Abdominal:     Palpations: Abdomen is soft.     Comments: Right upper quadrant ileostomy  Genitourinary:    Comments: Bilateral nephrostomy tubes. Skin:    Comments: Right arm PIC site looks good.     Microbiology: Recent Results (from the past 240 hour(s))  Urine culture     Status: Abnormal   Collection Time: 10/04/18  5:29 PM  Result Value Ref Range Status   Specimen Description URINE, RANDOM  Final   Special Requests   Final    RIGHT NEPHROSTOMY Performed at [facility], [place] [address], [place], [place] [address]    Culture >=100,000 COLONIES/mL SERRATIA MARCESCENS (A)  Final   Report Status 10/06/2018 FINAL  Final  Organism ID, Bacteria SERRATIA MARCESCENS (A)  Final      Susceptibility   Serratia marcescens - MIC*    CEFAZOLIN >=64 RESISTANT Resistant     CEFTRIAXONE <=1 SENSITIVE Sensitive     CIPROFLOXACIN >=4 RESISTANT Resistant     GENTAMICIN <=1 SENSITIVE Sensitive     NITROFURANTOIN >=512 RESISTANT Resistant     TRIMETH/SULFA 40 SENSITIVE Sensitive     * >=100,000 COLONIES/mL SERRATIA MARCESCENS  Urine Culture     Status: Abnormal   Collection Time: 10/04/18  5:29 PM  Result Value Ref Range Status   Specimen Description URINE, RANDOM  Final   Special Requests   Final    LEFT NEPHROSTOMY Performed at [facility], [place] [address], [place], [place] [address]    Culture >=100,000 COLONIES/mL  SERRATIA MARCESCENS (A)  Final   Report Status 10/06/2018 FINAL  Final   Organism ID, Bacteria SERRATIA MARCESCENS (A)  Final      Susceptibility   Serratia marcescens - MIC*    CEFAZOLIN >=64 RESISTANT Resistant     CEFTRIAXONE <=1 SENSITIVE Sensitive     CIPROFLOXACIN >=4 RESISTANT Resistant     GENTAMICIN <=1 SENSITIVE Sensitive     NITROFURANTOIN >=512 RESISTANT Resistant     TRIMETH/SULFA 40 SENSITIVE Sensitive     * >=100,000 COLONIES/mL SERRATIA MARCESCENS  Blood culture (routine x 2)     Status: None   Collection Time: 10/04/18  6:41 PM  Result Value Ref Range Status   Specimen Description SITE NOT SPECIFIED  Final   Special Requests   Final    BOTTLES DRAWN AEROBIC AND ANAEROBIC Blood Culture adequate volume   Culture   Final    NO GROWTH 5 DAYS Performed at [facility], [address] [place], [place] [address]    Report Status 10/09/2018 FINAL  Final    Michel Bickers, MD [facility] [phone] pager   [phone] cell 10/09/2018, 4:51 PM

## 2018-10-09 NOTE — Telephone Encounter (Signed)
Called and spoke to [place] at [facility] and gave verbal order per Dr. Megan Salon to start Ceftriaxone 1 gm IV daily x 7 days for patient. Told her it was ok to start tomorrow as she was getting a dose in clinic today. [name] verbalized understanding.

## 2018-10-10 DIAGNOSIS — E860 Dehydration: Secondary | ICD-10-CM | POA: Diagnosis not present

## 2018-10-10 DIAGNOSIS — E785 Hyperlipidemia, unspecified: Secondary | ICD-10-CM | POA: Diagnosis not present

## 2018-10-10 DIAGNOSIS — L89309 Pressure ulcer of unspecified buttock, unspecified stage: Secondary | ICD-10-CM | POA: Diagnosis not present

## 2018-10-11 ENCOUNTER — Telehealth: Payer: Self-pay | Admitting: Family Medicine

## 2018-10-11 DIAGNOSIS — N179 Acute kidney failure, unspecified: Secondary | ICD-10-CM | POA: Diagnosis not present

## 2018-10-11 DIAGNOSIS — Z452 Encounter for adjustment and management of vascular access device: Secondary | ICD-10-CM | POA: Diagnosis not present

## 2018-10-11 DIAGNOSIS — T83592A Infection and inflammatory reaction due to indwelling ureteral stent, initial encounter: Secondary | ICD-10-CM | POA: Diagnosis not present

## 2018-10-11 DIAGNOSIS — C541 Malignant neoplasm of endometrium: Secondary | ICD-10-CM | POA: Diagnosis not present

## 2018-10-11 DIAGNOSIS — B9562 Methicillin resistant Staphylococcus aureus infection as the cause of diseases classified elsewhere: Secondary | ICD-10-CM | POA: Diagnosis not present

## 2018-10-11 DIAGNOSIS — D631 Anemia in chronic kidney disease: Secondary | ICD-10-CM | POA: Diagnosis not present

## 2018-10-11 DIAGNOSIS — L89312 Pressure ulcer of right buttock, stage 2: Secondary | ICD-10-CM | POA: Diagnosis not present

## 2018-10-11 DIAGNOSIS — D709 Neutropenia, unspecified: Secondary | ICD-10-CM | POA: Diagnosis not present

## 2018-10-11 DIAGNOSIS — L89153 Pressure ulcer of sacral region, stage 3: Secondary | ICD-10-CM | POA: Diagnosis not present

## 2018-10-11 DIAGNOSIS — E1122 Type 2 diabetes mellitus with diabetic chronic kidney disease: Secondary | ICD-10-CM | POA: Diagnosis not present

## 2018-10-11 DIAGNOSIS — L89311 Pressure ulcer of right buttock, stage 1: Secondary | ICD-10-CM | POA: Diagnosis not present

## 2018-10-11 DIAGNOSIS — N182 Chronic kidney disease, stage 2 (mild): Secondary | ICD-10-CM | POA: Diagnosis not present

## 2018-10-11 DIAGNOSIS — L89309 Pressure ulcer of unspecified buttock, unspecified stage: Secondary | ICD-10-CM | POA: Diagnosis not present

## 2018-10-11 DIAGNOSIS — T82594D Other mechanical complication of infusion catheter, subsequent encounter: Secondary | ICD-10-CM | POA: Diagnosis not present

## 2018-10-11 NOTE — Telephone Encounter (Signed)
Medication not delegated to NT to refill. Routing to office to review. Called pt and unable to leave message mailbox is full- attempted to make an appt for pt.

## 2018-10-11 NOTE — Telephone Encounter (Signed)
Copied from [place] [id number]. Topic: Quick Communication - Rx Refill/Question >> Oct 11, 2018 11:52 AM Reyne Dumas L wrote: Medication: HYDROcodone-acetaminophen (NORCO/VICODIN) 5-325 MG tablet   Has the patient contacted their pharmacy? No - states they always have to call the office for this.  Raoul Pitch, RN from [facility] calling to request this - Baker Janus needs to know if this will be refilled [phone] (Agent: If no, request that the patient contact the pharmacy for the refill.) (Agent: If yes, when and what did the pharmacy advise?)  Preferred Pharmacy (with phone number or street name): [facility], [place] - [place] [phone] (Phone) [phone] (Fax)  Agent: Please be advised that RX refills may take up to 3 business days. We ask that you follow-up with your pharmacy.

## 2018-10-12 ENCOUNTER — Other Ambulatory Visit: Payer: Self-pay | Admitting: Family Medicine

## 2018-10-12 DIAGNOSIS — G8928 Other chronic postprocedural pain: Secondary | ICD-10-CM

## 2018-10-12 MED ORDER — HYDROCODONE-ACETAMINOPHEN 5-325 MG PO TABS: 1.0000 | ORAL_TABLET | Freq: Four times a day (QID) | ORAL | 0 refills | Status: AC | PRN

## 2018-10-12 NOTE — Telephone Encounter (Signed)
Last written:10/04/18 Last ov: 08/22/18 Next ov: 11/21/18 Contract: will get at 11/21/18 visit UDS: will get at 11/21/18 visit

## 2018-10-13 ENCOUNTER — Other Ambulatory Visit: Payer: Self-pay | Admitting: Hematology and Oncology

## 2018-10-13 ENCOUNTER — Other Ambulatory Visit: Payer: Self-pay | Admitting: Family Medicine

## 2018-10-13 DIAGNOSIS — C55 Malignant neoplasm of uterus, part unspecified: Secondary | ICD-10-CM

## 2018-10-13 DIAGNOSIS — D709 Neutropenia, unspecified: Secondary | ICD-10-CM

## 2018-10-13 DIAGNOSIS — E559 Vitamin D deficiency, unspecified: Secondary | ICD-10-CM | POA: Diagnosis not present

## 2018-10-13 DIAGNOSIS — D61818 Other pancytopenia: Secondary | ICD-10-CM

## 2018-10-13 DIAGNOSIS — E039 Hypothyroidism, unspecified: Secondary | ICD-10-CM | POA: Diagnosis not present

## 2018-10-13 DIAGNOSIS — E119 Type 2 diabetes mellitus without complications: Secondary | ICD-10-CM | POA: Diagnosis not present

## 2018-10-13 DIAGNOSIS — E782 Mixed hyperlipidemia: Secondary | ICD-10-CM | POA: Diagnosis not present

## 2018-10-13 MED ORDER — FILGRASTIM-SNDZ 480 MCG/0.8ML IJ SOSY: 480.0000 ug | PREFILLED_SYRINGE | INTRAMUSCULAR | 99 refills | Status: DC

## 2018-10-16 ENCOUNTER — Telehealth: Payer: Self-pay | Admitting: Internal Medicine

## 2018-10-16 MED ORDER — FENTANYL 50 MCG/HR TD PT72: 1.0000 | MEDICATED_PATCH | TRANSDERMAL | 0 refills | Status: DC

## 2018-10-16 NOTE — Telephone Encounter (Signed)
From: [name]  Sent: 10/15/2018  7:19 PM EDT  To: [facility] Clinical Pool  Subject: Visit Follow-Up Question               As agreed during my office visit on 10/09/18, I am emailing you with an update concerning the current status of my UTI symptoms in response to my antibiotic treatment. Today is the first day that I have not required pain medication for the left flank pain and the uncontrollable bladder spasms that I have been having. The bladder spasms have been occurring less frequently, as has the amount of malodorous bleeding that has been associated with the spasms due to my uncontrollable urge to "bear down" when they occur. My urine is beginning to be less cloudy; however I am still seeing white clusters of infection in my nephrostomy bags and in the Foley collecting the drainage from my rectal-bladder fistula. [to be continued]   From: [name]  Sent: 10/15/2018  7:20 PM EDT  To: [facility] Clinical Pool  Subject: Visit Follow-Up Question               Based upon my current status and slow response after one week of treatment, if you would be in agreement, I believe that it would be beneficial for me to receive one more week of IV antibiotic treatment from [place] as my symptoms with this infection had become much worse in severity than with my last UTI infection, which required a total of three Rocephin injections and two weeks of oral antibiotics before my symptoms completely subsided. I am highly concerned that the one remaining dose for tomorrow will not be sufficient to substantially abate my infection so that it will not immediately recur. Should you have additional questions that I've failed to answer, please message or call me at [phone].  I appreciate your on-going help with treating my infection, as my symptoms and associated pain had just about driven me out of my mind. 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 Thank you very much.   I have agreed to extend to ceftriaxone therapy for 1 more week.

## 2018-10-16 NOTE — Telephone Encounter (Signed)
Came across My Chart message between Ms Vanhouten and Dr. Megan Salon about extending IV Ceftriaxone for 1 additional week.  Called [facility] to see if they had verbal order they did not.  Spoke to [employer] and gave verbal order to extend IV Ceftriaxone 1 gram for one additional week.  Jeani Hawking verbalized understanding and states he will get medication out to Ms. Sciara tomorrow 10/17/2018. Pricilla Riffle RN

## 2018-10-16 NOTE — Telephone Encounter (Signed)
Requesting: Fentanyl patch Contract: Yes UDS: No, last screen UDS 02/17/2018--low risk Last OV: 08/22/2018 Next OV: 11/21/2018 Last Refill: 09/18/2018, #10 patches---0 RF Database:   Please advise

## 2018-10-17 DIAGNOSIS — E860 Dehydration: Secondary | ICD-10-CM | POA: Diagnosis not present

## 2018-10-17 DIAGNOSIS — L89309 Pressure ulcer of unspecified buttock, unspecified stage: Secondary | ICD-10-CM | POA: Diagnosis not present

## 2018-10-17 DIAGNOSIS — E785 Hyperlipidemia, unspecified: Secondary | ICD-10-CM | POA: Diagnosis not present

## 2018-10-18 DIAGNOSIS — L89153 Pressure ulcer of sacral region, stage 3: Secondary | ICD-10-CM | POA: Diagnosis not present

## 2018-10-18 DIAGNOSIS — D709 Neutropenia, unspecified: Secondary | ICD-10-CM | POA: Diagnosis not present

## 2018-10-18 DIAGNOSIS — E1122 Type 2 diabetes mellitus with diabetic chronic kidney disease: Secondary | ICD-10-CM | POA: Diagnosis not present

## 2018-10-18 DIAGNOSIS — L89311 Pressure ulcer of right buttock, stage 1: Secondary | ICD-10-CM | POA: Diagnosis not present

## 2018-10-18 DIAGNOSIS — N182 Chronic kidney disease, stage 2 (mild): Secondary | ICD-10-CM | POA: Diagnosis not present

## 2018-10-18 DIAGNOSIS — D631 Anemia in chronic kidney disease: Secondary | ICD-10-CM | POA: Diagnosis not present

## 2018-10-18 DIAGNOSIS — T83592A Infection and inflammatory reaction due to indwelling ureteral stent, initial encounter: Secondary | ICD-10-CM | POA: Diagnosis not present

## 2018-10-18 DIAGNOSIS — Z452 Encounter for adjustment and management of vascular access device: Secondary | ICD-10-CM | POA: Diagnosis not present

## 2018-10-18 DIAGNOSIS — L89312 Pressure ulcer of right buttock, stage 2: Secondary | ICD-10-CM | POA: Diagnosis not present

## 2018-10-18 DIAGNOSIS — B9562 Methicillin resistant Staphylococcus aureus infection as the cause of diseases classified elsewhere: Secondary | ICD-10-CM | POA: Diagnosis not present

## 2018-10-18 DIAGNOSIS — T82594D Other mechanical complication of infusion catheter, subsequent encounter: Secondary | ICD-10-CM | POA: Diagnosis not present

## 2018-10-18 DIAGNOSIS — C541 Malignant neoplasm of endometrium: Secondary | ICD-10-CM | POA: Diagnosis not present

## 2018-10-18 DIAGNOSIS — L89309 Pressure ulcer of unspecified buttock, unspecified stage: Secondary | ICD-10-CM | POA: Diagnosis not present

## 2018-10-18 DIAGNOSIS — N179 Acute kidney failure, unspecified: Secondary | ICD-10-CM | POA: Diagnosis not present

## 2018-10-23 DIAGNOSIS — L89309 Pressure ulcer of unspecified buttock, unspecified stage: Secondary | ICD-10-CM | POA: Diagnosis not present

## 2018-10-23 DIAGNOSIS — E785 Hyperlipidemia, unspecified: Secondary | ICD-10-CM | POA: Diagnosis not present

## 2018-10-23 DIAGNOSIS — E860 Dehydration: Secondary | ICD-10-CM | POA: Diagnosis not present

## 2018-10-24 ENCOUNTER — Telehealth: Payer: Self-pay

## 2018-10-24 ENCOUNTER — Encounter: Payer: Self-pay | Admitting: Hematology and Oncology

## 2018-10-24 ENCOUNTER — Other Ambulatory Visit: Payer: Self-pay

## 2018-10-24 MED ORDER — FILGRASTIM-SNDZ 480 MCG/0.8ML IJ SOSY: 480.0000 ug | PREFILLED_SYRINGE | INTRAMUSCULAR | 99 refills | Status: DC

## 2018-10-24 NOTE — Telephone Encounter (Signed)
Spoke with representative from [facility] and confirmed that they did receive the faxed prior authorization and electronic prescription for Zarxio today.   Pt's spouse made aware that someone from [place] should contact them in the next couple of days for delivery.  Spouse instructed to call this office if he has not heard from them by Friday.

## 2018-10-25 DIAGNOSIS — N182 Chronic kidney disease, stage 2 (mild): Secondary | ICD-10-CM | POA: Diagnosis not present

## 2018-10-25 DIAGNOSIS — T83592A Infection and inflammatory reaction due to indwelling ureteral stent, initial encounter: Secondary | ICD-10-CM | POA: Diagnosis not present

## 2018-10-25 DIAGNOSIS — B9562 Methicillin resistant Staphylococcus aureus infection as the cause of diseases classified elsewhere: Secondary | ICD-10-CM | POA: Diagnosis not present

## 2018-10-25 DIAGNOSIS — L89311 Pressure ulcer of right buttock, stage 1: Secondary | ICD-10-CM | POA: Diagnosis not present

## 2018-10-25 DIAGNOSIS — D631 Anemia in chronic kidney disease: Secondary | ICD-10-CM | POA: Diagnosis not present

## 2018-10-25 DIAGNOSIS — E860 Dehydration: Secondary | ICD-10-CM | POA: Diagnosis not present

## 2018-10-25 DIAGNOSIS — C541 Malignant neoplasm of endometrium: Secondary | ICD-10-CM | POA: Diagnosis not present

## 2018-10-25 DIAGNOSIS — L89312 Pressure ulcer of right buttock, stage 2: Secondary | ICD-10-CM | POA: Diagnosis not present

## 2018-10-25 DIAGNOSIS — N179 Acute kidney failure, unspecified: Secondary | ICD-10-CM | POA: Diagnosis not present

## 2018-10-25 DIAGNOSIS — D709 Neutropenia, unspecified: Secondary | ICD-10-CM | POA: Diagnosis not present

## 2018-10-25 DIAGNOSIS — T82594D Other mechanical complication of infusion catheter, subsequent encounter: Secondary | ICD-10-CM | POA: Diagnosis not present

## 2018-10-25 DIAGNOSIS — L89153 Pressure ulcer of sacral region, stage 3: Secondary | ICD-10-CM | POA: Diagnosis not present

## 2018-10-25 DIAGNOSIS — Z452 Encounter for adjustment and management of vascular access device: Secondary | ICD-10-CM | POA: Diagnosis not present

## 2018-10-25 DIAGNOSIS — E1122 Type 2 diabetes mellitus with diabetic chronic kidney disease: Secondary | ICD-10-CM | POA: Diagnosis not present

## 2018-10-26 ENCOUNTER — Encounter (INDEPENDENT_AMBULATORY_CARE_PROVIDER_SITE_OTHER): Payer: Self-pay

## 2018-10-26 DIAGNOSIS — E119 Type 2 diabetes mellitus without complications: Secondary | ICD-10-CM | POA: Diagnosis not present

## 2018-10-26 DIAGNOSIS — N183 Chronic kidney disease, stage 3 (moderate): Secondary | ICD-10-CM | POA: Diagnosis not present

## 2018-10-26 DIAGNOSIS — E861 Hypovolemia: Secondary | ICD-10-CM | POA: Diagnosis not present

## 2018-10-26 DIAGNOSIS — I10 Essential (primary) hypertension: Secondary | ICD-10-CM | POA: Diagnosis not present

## 2018-10-27 ENCOUNTER — Ambulatory Visit (HOSPITAL_COMMUNITY)
Admission: RE | Admit: 2018-10-27 | Discharge: 2018-10-27 | Disposition: A | Discharge status: Home / Self Care | Payer: BLUE CROSS/BLUE SHIELD | Source: Ambulatory Visit | Attending: Interventional Radiology | Admitting: Interventional Radiology

## 2018-10-27 ENCOUNTER — Other Ambulatory Visit: Payer: Self-pay

## 2018-10-27 ENCOUNTER — Other Ambulatory Visit (HOSPITAL_COMMUNITY): Payer: Self-pay | Admitting: Interventional Radiology

## 2018-10-27 ENCOUNTER — Encounter (HOSPITAL_COMMUNITY): Payer: Self-pay | Admitting: Diagnostic Radiology

## 2018-10-27 ENCOUNTER — Other Ambulatory Visit (HOSPITAL_COMMUNITY): Payer: Self-pay | Admitting: Diagnostic Radiology

## 2018-10-27 DIAGNOSIS — C569 Malignant neoplasm of unspecified ovary: Secondary | ICD-10-CM | POA: Diagnosis not present

## 2018-10-27 DIAGNOSIS — L89312 Pressure ulcer of right buttock, stage 2: Secondary | ICD-10-CM | POA: Diagnosis not present

## 2018-10-27 DIAGNOSIS — N179 Acute kidney failure, unspecified: Secondary | ICD-10-CM | POA: Diagnosis not present

## 2018-10-27 DIAGNOSIS — B9562 Methicillin resistant Staphylococcus aureus infection as the cause of diseases classified elsewhere: Secondary | ICD-10-CM | POA: Diagnosis not present

## 2018-10-27 DIAGNOSIS — E860 Dehydration: Secondary | ICD-10-CM | POA: Diagnosis not present

## 2018-10-27 DIAGNOSIS — T82594D Other mechanical complication of infusion catheter, subsequent encounter: Secondary | ICD-10-CM | POA: Diagnosis not present

## 2018-10-27 DIAGNOSIS — L89311 Pressure ulcer of right buttock, stage 1: Secondary | ICD-10-CM | POA: Diagnosis not present

## 2018-10-27 DIAGNOSIS — Z436 Encounter for attention to other artificial openings of urinary tract: Secondary | ICD-10-CM | POA: Diagnosis not present

## 2018-10-27 DIAGNOSIS — L89309 Pressure ulcer of unspecified buttock, unspecified stage: Secondary | ICD-10-CM | POA: Diagnosis not present

## 2018-10-27 DIAGNOSIS — E785 Hyperlipidemia, unspecified: Secondary | ICD-10-CM | POA: Diagnosis not present

## 2018-10-27 DIAGNOSIS — C541 Malignant neoplasm of endometrium: Secondary | ICD-10-CM

## 2018-10-27 DIAGNOSIS — D631 Anemia in chronic kidney disease: Secondary | ICD-10-CM | POA: Diagnosis not present

## 2018-10-27 DIAGNOSIS — Z452 Encounter for adjustment and management of vascular access device: Secondary | ICD-10-CM | POA: Diagnosis not present

## 2018-10-27 DIAGNOSIS — D709 Neutropenia, unspecified: Secondary | ICD-10-CM | POA: Diagnosis not present

## 2018-10-27 DIAGNOSIS — T83592A Infection and inflammatory reaction due to indwelling ureteral stent, initial encounter: Secondary | ICD-10-CM | POA: Diagnosis not present

## 2018-10-27 DIAGNOSIS — E1122 Type 2 diabetes mellitus with diabetic chronic kidney disease: Secondary | ICD-10-CM | POA: Diagnosis not present

## 2018-10-27 DIAGNOSIS — L89153 Pressure ulcer of sacral region, stage 3: Secondary | ICD-10-CM | POA: Diagnosis not present

## 2018-10-27 DIAGNOSIS — N182 Chronic kidney disease, stage 2 (mild): Secondary | ICD-10-CM | POA: Diagnosis not present

## 2018-10-27 HISTORY — PX: IR NEPHROSTOMY EXCHANGE RIGHT: IMG6070

## 2018-10-27 HISTORY — PX: IR NEPHROSTOMY EXCHANGE LEFT: IMG6069

## 2018-10-27 MED ORDER — IOHEXOL 300 MG/ML  SOLN
50.0000 mL | Freq: Once | INTRAMUSCULAR | Status: AC | PRN
  Administered 2018-10-27: 15 mL

## 2018-10-27 MED ORDER — LIDOCAINE HCL 1 % IJ SOLN
INTRAMUSCULAR | Status: AC
  Filled 2018-10-27: qty 20

## 2018-10-27 NOTE — Telephone Encounter (Signed)
Relayed verbal order to [facility] to extend Ceftraixone 1 gm IV daily plus PICC care and labs for 1 more week per Dr Megan Salon. Landis Gandy, RN

## 2018-10-27 NOTE — Procedures (Signed)
Interventional Radiology Procedure:   Indications: Endometrial cancer and chronic nephrostomy tubes  Procedure: Bilateral nephrostomy tube exchanges  Findings: 12 Fr Bettey Mare catheters placed bilaterally  Complications: None     EBL: None  Plan: Routine exchange in future.    Jaidence Geisler R. Anselm Pancoast, MD  Pager: [phone]

## 2018-10-29 DIAGNOSIS — E860 Dehydration: Secondary | ICD-10-CM | POA: Diagnosis not present

## 2018-10-30 ENCOUNTER — Other Ambulatory Visit: Payer: Self-pay

## 2018-10-30 ENCOUNTER — Encounter (INDEPENDENT_AMBULATORY_CARE_PROVIDER_SITE_OTHER): Payer: Self-pay | Admitting: Family Medicine

## 2018-10-30 ENCOUNTER — Ambulatory Visit (INDEPENDENT_AMBULATORY_CARE_PROVIDER_SITE_OTHER): Payer: BLUE CROSS/BLUE SHIELD | Admitting: Family Medicine

## 2018-10-30 DIAGNOSIS — Z6836 Body mass index (BMI) 36.0-36.9, adult: Secondary | ICD-10-CM

## 2018-10-30 DIAGNOSIS — E119 Type 2 diabetes mellitus without complications: Secondary | ICD-10-CM | POA: Diagnosis not present

## 2018-10-30 DIAGNOSIS — F3289 Other specified depressive episodes: Secondary | ICD-10-CM | POA: Diagnosis not present

## 2018-10-30 MED ORDER — BUPROPION HCL ER (SR) 200 MG PO TB12: 200.0000 mg | ORAL_TABLET | Freq: Every day | ORAL | 0 refills | Status: DC

## 2018-10-31 DIAGNOSIS — Z933 Colostomy status: Secondary | ICD-10-CM | POA: Diagnosis not present

## 2018-10-31 DIAGNOSIS — L89319 Pressure ulcer of right buttock, unspecified stage: Secondary | ICD-10-CM | POA: Diagnosis not present

## 2018-10-31 NOTE — Progress Notes (Signed)
Office: [phone]  /  Fax: [phone] TeleHealth Visit:  [name] has consented to this TeleHealth visit today via telephone call due to patients inability to access video. The patient is located at home, the provider is located at the [employer] [facility] office. The participants in this visit include the listed provider and patient.   HPI:   Chief Complaint: OBESITY [name] is here to discuss her progress with her obesity treatment plan. She is on the keep a food journal with 1200-1300 calories and 75+ grams of protein daily and is following her eating plan approximately 90 % of the time. She states she is exercising 0 minutes 0 times per week. [name] has been getting IV antibiotics for approximately 2 weeks for chronic UTI and possibly pyelonephritis.  We were unable to weight the patient today for this TeleHealth visit. She feels as if she has lost about 1 lb since her last visit. She has lost 18-19 lbs since starting treatment with [name].  Diabetes II Electra has a diagnosis of diabetes type II. [name] is on Januvia, her last A1c in Epic was 7.9. She states her fasting BGs range mostly between 80 and 120. She is followed by Endocrinology. She is doing very well on her diet prescription and with weight loss to help control her blood glucose levels. Her hunger is controlled.  Depression with emotional eating behaviors We increased Wellbutrin at last visit from 150 mg to 200 mg q AM. [name] notes her mood is stable ans is not eating emotionally as often. [name] struggles with emotional eating and using food for comfort to the extent that it is negatively impacting her health. She often snacks when she is not hungry. [name] sometimes feels she is out of control and then feels guilty that she made poor food choices. She has been working on behavior modification techniques to help reduce her emotional eating and has been somewhat successful. She shows no sign of suicidal or homicidal  ideations.  Depression screen [facility] 2/9 04/20/2018 04/05/2018 06/21/2017 08/30/2016  Decreased Interest 0 1 0 0  Down, Depressed, Hopeless 1 1 0 0  PHQ - 2 Score 1 2 0 0  Altered sleeping 0 0 - -  Tired, decreased energy 1 1 - -  Change in appetite 0 1 - -  Feeling bad or failure about yourself  0 0 - -  Trouble concentrating 0 0 - -  Moving slowly or fidgety/restless 0 0 - -  Suicidal thoughts 0 0 - -  PHQ-9 Score 2 4 - -  Difficult doing work/chores - Somewhat difficult - -  Some recent data might be hidden    ASSESSMENT AND PLAN:  Type 2 diabetes mellitus without complication, without long-term current use of insulin (HCC)  Other depression - with emotional eating - Plan: buPROPion (WELLBUTRIN SR) 200 MG 12 hr tablet  Class 2 severe obesity with serious comorbidity and body mass index (BMI) of 36.0 to 36.9 in adult, unspecified obesity type ([place])  PLAN:  Diabetes II [name] has been given extensive diabetes education by myself today including ideal fasting and post-prandial blood glucose readings, individual ideal Hgb A1c goals and hypoglycemia prevention. We discussed the importance of good blood sugar control to decrease the likelihood of diabetic complications such as nephropathy, neuropathy, limb loss, blindness, coronary artery disease, and death. We discussed the importance of intensive lifestyle modification including diet, exercise and weight loss as the first line treatment for diabetes. [name] agrees to continue  her diabetes medications and diet, and she agrees to follow up with our clinic in 2 to 3 weeks.  Depression with Emotional Eating Behaviors We discussed behavior modification techniques today to help [name] deal with her emotional eating and depression. [name] agrees to continue taking Wellbutrin SR 200 mg q AM #30 and we will refill for 1 month. [name] agrees to follow up with our clinic in 2 to 3 weeks.  Obesity [name] is currently in the action stage of change. As such,  her goal is to continue with weight loss efforts She has agreed to keep a food journal with 1200-1300 calories and 75+ grams of protein daily [name] has been instructed to work up to a goal of 150 minutes of combined cardio and strengthening exercise per week for weight loss and overall health benefits. We discussed the following Behavioral Modification Strategies today: increasing lean protein intake, decreasing simple carbohydrates, work on meal planning and easy cooking plans, emotional eating strategies, ways to avoid boredom eating, ways to avoid night time snacking, keeping healthy foods in the home, and planning for success   [name] has agreed to follow up with our clinic in 2 to 3 weeks. She was informed of the importance of frequent follow up visits to maximize her success with intensive lifestyle modifications for her multiple health conditions.  ALLERGIES: Allergies  Allergen Reactions   Penicillins Swelling    Facial swelling/childhood allergy Has patient had a PCN reaction causing immediate rash, facial/tongue/throat swelling, SOB or lightheadedness with hypotension: Yes Has patient had a PCN reaction causing severe rash involving mucus membranes or skin necrosis: Yes Has patient had a PCN reaction that required hospitalization yes Has patient had a PCN reaction occurring within the last 10 years: No If all of the above answers are "NO", then may proceed with Cephalosporin use.    Cefaclor Rash    Ceclor   Erythromycin Other (See Comments)    Gastritis, abd cramps   Tape Rash    blisters   Trimethoprim Rash   Ultram [Tramadol] Hives   Cephalosporins Rash   Fluconazole Rash   Oxycodone Other (See Comments)    " I just feel weird"   Pectin Rash    Pectin ring for stoma   Septra [Sulfamethoxazole-Trimethoprim] Rash   Sulfa Antibiotics Rash    MEDICATIONS: Current Outpatient Medications on File Prior to Visit  Medication Sig Dispense Refill   acetaminophen  (TYLENOL) 325 MG tablet Take 2 tablets (650 mg total) by mouth every 6 (six) hours as needed. 30 tablet 1   anastrozole (ARIMIDEX) 1 MG tablet TAKE 1 TABLET DAILY (Patient taking differently: Take 1 mg by mouth daily. ) 90 tablet 3   Biotin 5 MG TABS Take 5 mg by mouth every morning.      Cholecalciferol (VITAMIN D3) 10000 UNITS capsule Take 10,000 Units by mouth once a week. Sunday evening's     clobetasol (OLUX) 0.05 % topical foam Apply topically 2 (two) times daily.     diphenhydrAMINE (BENADRYL) 25 mg capsule Take 1 capsule (25 mg total) by mouth every 8 (eight) hours as needed for itching, allergies or sleep. 30 capsule 0   diphenoxylate-atropine (LOMOTIL) 2.5-0.025 MG tablet Take 1 tablet by mouth 4 (four) times daily as needed for diarrhea or loose stools. TO PREVENT LOOSE BOWEL MOVEMENTS (Patient taking differently: Take 1 tablet by mouth every other day. TO PREVENT LOOSE BOWEL MOVEMENTS) 30 tablet 0   fentaNYL (DURAGESIC) 50 MCG/HR Place 1 patch onto the  skin every 3 (three) days. 10 patch 0   ferrous sulfate 325 (65 FE) MG tablet Take 1 tablet (325 mg total) by mouth at bedtime. 30 tablet 3   filgrastim-sndz (ZARXIO) 480 MCG/0.8ML SOSY injection Inject 0.8 mLs (480 mcg total) into the skin as directed. Inject 480 mcg every 5 days 0.8 mL 99   JANUVIA 50 MG tablet Take 50 mg by mouth daily.      levothyroxine (SYNTHROID, LEVOTHROID) 150 MCG tablet Take 1 tablet (150 mcg total) by mouth daily before breakfast. 30 tablet 1   loratadine (CLARITIN) 10 MG tablet Take 10 mg by mouth every morning.      nitrofurantoin, macrocrystal-monohydrate, (MACROBID) 100 MG capsule Take 100 mg by mouth at bedtime.     omega-3 acid ethyl esters (LOVAZA) 1 G capsule Take 1 g by mouth 2 (two) times daily.      Polyethyl Glycol-Propyl Glycol (SYSTANE OP) Place 1 drop into both eyes daily as needed (dry eyes).      pregabalin (LYRICA) 50 MG capsule TAKE 1 CAPSULE(50 MG) BY MOUTH TWICE DAILY 180  capsule 3   Prenatal Vit-Fe Fumarate-FA (PRENATAL VITAMIN PO) Take 1 capsule by mouth daily. Takes prenatal because there are no dyes in it     rosuvastatin (CRESTOR) 10 MG tablet Take 10 mg by mouth every evening.      Saccharomyces boulardii (FLORASTOR PO) Take 1 capsule by mouth daily.     sodium bicarbonate 650 MG tablet Take 650 mg by mouth 2 (two) times daily.     sodium chloride 0.9 % injection Inject 10 mLs into the vein daily.     Current Facility-Administered Medications on File Prior to Visit  Medication Dose Route Frequency Provider Last Rate Last Dose   cefTRIAXone (ROCEPHIN) injection 1 g  1 g Intramuscular Q24H Michel Bickers, MD       cefTRIAXone (ROCEPHIN) injection 1 g  1 g Intramuscular Once Michel Bickers, MD        PAST MEDICAL HISTORY: Past Medical History:  Diagnosis Date   Adrenal adenoma, left 02/08/2016   CT: stable benign   Anemia in neoplastic disease    Back pain    Benign essential HTN    Breast cancer, left [facility]) dx 10-30-2015  oncologist-  dr Ernst Spell gorsuch   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+, HER2 negative/  12-11-2015 bilateral mastecotmy w/ reconstruction (no radiation and no chemo)   Cancer of corpus uteri, except isthmus [facility])  oncologist-- dr Denman [name] and dr Alvy Bimler    10-15-2004  dx endometroid endometrial and ovarian cancer s/p  chemotheapy and surgery(TAH w/ BSO) :  recurrent 11-19-2014 post pelvic surgery and radiation 01-29-2015 to 03-10-2015   Chronic idiopathic neutropenia ([place])    presumed related to chemotherapy March 2006--- followed by dr Alvy Bimler (treatment w/ G-CSF injections   Chronic nausea    Chronic pain    perineal/ anal  area from bladder pad irritates skin , right flank pain   CKD stage G2/A3, GFR 60-89 and albumin creatinine ratio >300 mg/g    nephrologist-  dr Madelon Lips   Colovesical fistula    Diabetic retinopathy, background ([place])    Difficult intravenous access    small  veins--- hx PICC lines   DM type 2 (diabetes mellitus, type 2) ([place])    monitored by dr Legrand Como altheimer   Dysuria    Environmental and seasonal allergies    Fatty liver 02/08/2016   CT   Generalized muscle  weakness    GERD (gastroesophageal reflux disease)    Hiatal hernia    History of abdominal abscess 04/16/2017   post surgery 04-01-2017  --- resolved 10/ 2018   History of gastric polyp    2014  duodenum   History of ileus 04/16/2017   resolved w/ no surgical intervention   History of radiation therapy    01-29-2015 to 03-10-2015  pelvis 50.4Gy   Hypothyroidism    monitored by dr Legrand Como altheimer   IBS (irritable bowel syndrome)    Ileostomy in place [facility]) 04/01/2017   created at same time colostomy takedown.   Joint pain    Leg edema    Lower urinary tract symptoms (LUTS)    urge urinary  incontinence   Mixed dyslipidemia    Multiple thyroid nodules    Managed by Dr. Harlow Asa   Nephrostomy status [facility])    Palpitations    Pelvic abscess in female 04/16/2017   PONV (postoperative nausea and vomiting)    "scopolamine patch works for me"   Radiation-induced dermatitis    contact dermatitis , radiation completed, rash only on ankles now.   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   Vitamin D deficiency    Wears glasses     PAST SURGICAL HISTORY: Past Surgical History:  Procedure Laterality Date   APPENDECTOMY     biopsy thyroid nodules     BREAST RECONSTRUCTION WITH PLACEMENT OF TISSUE EXPANDER AND FLEX HD (ACELLULAR HYDRATED DERMIS) Bilateral 12/11/2015   Procedure: BILATERAL BREAST RECONSTRUCTION WITH PLACEMENT OF TISSUE EXPANDERS;  Surgeon: Irene Limbo, MD;  Location: [place];  Service: Plastics;  Laterality: Bilateral;   COLONOSCOPY WITH PROPOFOL N/A 08/21/2013   Procedure: COLONOSCOPY WITH PROPOFOL;  Surgeon: Cleotis Nipper, MD;  [facility];  Service: Endoscopy;  Laterality: N/A;   COLOSTOMY TAKEDOWN N/A 12/04/2014   Procedure: LAPROSCOPIC LYSIS OF ADHESIONS, SPLENIC MOBILIZATION, RELOCATION OF COLOSTOMY, DEBRIDEMENT INITIAL COLOSTOMY SITE;  Surgeon: Michael [name], MD;  [facility];  Service: General;  Laterality: N/A;   CYSTOGRAM N/A 06/01/2017   Procedure: CYSTOGRAM;  Surgeon: Alexis Frock, MD;  [facility];  Service: Urology;  Laterality: N/A;   CYSTOSCOPY W/ RETROGRADES Right 11/21/2015   Procedure: CYSTOSCOPY WITH RETROGRADE PYELOGRAM;  Surgeon: Alexis Frock, MD;  [facility];  Service: Urology;  Laterality: Right;   CYSTOSCOPY W/ URETERAL STENT PLACEMENT Right 11/21/2015   Procedure: CYSTOSCOPY WITH STENT REPLACEMENT;  Surgeon: Alexis Frock, MD;  [facility];  Service: Urology;  Laterality: Right;   CYSTOSCOPY W/ URETERAL STENT PLACEMENT Right 03/10/2016   Procedure: CYSTOSCOPY WITH STENT REPLACEMENT;  Surgeon: Alexis Frock, MD;  Location[facility];  Service: Urology;  Laterality: Right;   CYSTOSCOPY W/ URETERAL STENT PLACEMENT Right 06/30/2016   Procedure: CYSTOSCOPY WITH RETROGRADE PYELOGRAM/URETERAL STENT EXCHANGE;  Surgeon: Alexis Frock, MD;  [facility];  Service: Urology;  Laterality: Right;   CYSTOSCOPY W/ URETERAL STENT PLACEMENT N/A 06/01/2017   Procedure: CYSTOSCOPY WITH EXAM UNDER ANESTHESIA;  Surgeon: Alexis Frock, MD;  [facility];  Service: Urology;  Laterality: N/A;   CYSTOSCOPY W/ URETERAL STENT PLACEMENT Right 08/17/2017   Procedure: CYSTOSCOPY WITH RETROGRADE PYELOGRAM/URETERAL STENT REMOVAL;  Surgeon: Alexis Frock, MD;  Location: [facility];  Service: Urology;  Laterality: Right;   CYSTOSCOPY WITH RETROGRADE PYELOGRAM, URETEROSCOPY AND STENT PLACEMENT Right 03/20/2015  Procedure: CYSTOSCOPY WITH RETROGRADE PYELOGRAM, URETEROSCOPY WITH BALLOON DILATION AND STENT PLACEMENT ON  RIGHT;  Surgeon: Alexis Frock, MD;  Location: [facility];  Service: Urology;  Laterality: Right;   CYSTOSCOPY WITH RETROGRADE PYELOGRAM, URETEROSCOPY AND STENT PLACEMENT Right 05/02/2015   Procedure: CYSTOSCOPY WITH RIGHT RETROGRADE PYELOGRAM,  DIAGNOSTIC URETEROSCOPY AND STENT PULL ;  Surgeon: Alexis Frock, MD;  Location: [facility];  Service: Urology;  Laterality: Right;   CYSTOSCOPY WITH RETROGRADE PYELOGRAM, URETEROSCOPY AND STENT PLACEMENT Right 09/05/2015   Procedure: CYSTOSCOPY WITH RETROGRADE PYELOGRAM,  AND STENT PLACEMENT;  Surgeon: Alexis Frock, MD;  [facility];  Service: Urology;  Laterality: Right;   CYSTOSCOPY WITH RETROGRADE PYELOGRAM, URETEROSCOPY AND STENT PLACEMENT Right 04/01/2017   Procedure: CYSTOSCOPY WITH RETROGRADE PYELOGRAM, URETEROSCOPY AND STENT PLACEMENT;  Surgeon: Alexis Frock, MD;  [facility];  Service: Urology;  Laterality: Right;   CYSTOSCOPY WITH STENT PLACEMENT Right 10/27/2016   Procedure: CYSTOSCOPY WITH STENT CHANGE and right retrograde pyelogram;  Surgeon: Alexis Frock, MD;  Location[name];  Service: Urology;  Laterality: Right;   EUS N/A 10/02/2014   Procedure: LOWER ENDOSCOPIC ULTRASOUND (EUS);  Surgeon: Arta Silence, MD;  Location[facility];  Service: Endoscopy;  Laterality: N/A;   EXCISION SOFT TISSUE MASS RIGHT FOREMAN  12-08-2006   EYE SURGERY  as child   pytosis of eyelids repair   INCISION AND DRAINAGE OF WOUND Bilateral 12/26/2015   Procedure: DEBRIDEMENT OF BILATERAL MASTECTOMY FLAPS;  Surgeon: Irene Limbo, MD;  Location: [place];  Service: Plastics;  Laterality: Bilateral;   IR CV LINE INJECTION  05/31/2017   IR FLUORO GUIDE CV LINE LEFT  05/31/2017   IR FLUORO GUIDE CV LINE RIGHT  04/06/2017   IR FLUORO GUIDE CV MIDLINE PICC RIGHT  05/30/2017   IR NEPHROSTOGRAM LEFT INITIAL PLACEMENT  09/02/2017   IR NEPHROSTOGRAM LEFT THRU EXISTING ACCESS   11/29/2017   IR NEPHROSTOGRAM RIGHT INITIAL PLACEMENT  09/02/2017   IR NEPHROSTOGRAM RIGHT THRU EXISTING ACCESS  09/13/2017   IR NEPHROSTOGRAM RIGHT THRU EXISTING ACCESS  11/29/2017   IR NEPHROSTOMY EXCHANGE LEFT  11/28/2017   IR NEPHROSTOMY EXCHANGE LEFT  01/05/2018   IR NEPHROSTOMY EXCHANGE LEFT  02/16/2018   IR NEPHROSTOMY EXCHANGE LEFT  03/30/2018   IR NEPHROSTOMY EXCHANGE LEFT  05/12/2018   IR NEPHROSTOMY EXCHANGE LEFT  06/21/2018   IR NEPHROSTOMY EXCHANGE LEFT  08/04/2018   IR NEPHROSTOMY EXCHANGE LEFT  09/18/2018   IR NEPHROSTOMY EXCHANGE LEFT  10/09/2018   IR NEPHROSTOMY EXCHANGE LEFT  10/27/2018   IR NEPHROSTOMY EXCHANGE RIGHT  10/02/2017   IR NEPHROSTOMY EXCHANGE RIGHT  11/28/2017   IR NEPHROSTOMY EXCHANGE RIGHT  01/05/2018   IR NEPHROSTOMY EXCHANGE RIGHT  02/16/2018   IR NEPHROSTOMY EXCHANGE RIGHT  03/30/2018   IR NEPHROSTOMY EXCHANGE RIGHT  05/12/2018   IR NEPHROSTOMY EXCHANGE RIGHT  06/21/2018   IR NEPHROSTOMY EXCHANGE RIGHT  08/04/2018   IR NEPHROSTOMY EXCHANGE RIGHT  09/18/2018   IR NEPHROSTOMY EXCHANGE RIGHT  10/27/2018   IR NEPHROSTOMY PLACEMENT LEFT  10/02/2017   IR RADIOLOGIST EVAL & MGMT  05/03/2017   IR [name] GUIDE VASC ACCESS LEFT  05/31/2017   IR [name] GUIDE VASC ACCESS RIGHT  04/06/2017   IR [name] GUIDE VASC ACCESS RIGHT  05/30/2017   LAPAROSCOPIC CHOLECYSTECTOMY  1990   LIPOSUCTION WITH LIPOFILLING Bilateral 04/16/2016   Procedure: LIPOSUCTION WITH LIPOFILLING TO BILATERAL CHEST;  Surgeon: Irene Limbo, MD;  Location: [place];  Service: Clinical cytogeneticist;  Laterality: Bilateral;   MASTECTOMY W/ SENTINEL NODE BIOPSY Bilateral 12/11/2015   Procedure: RIGHT PROPHYLACTIC MASTECTOMY, LEFT TOTAL MASTECTOMY WITH LEFT AXILLARY SENTINEL LYMPH NODE BIOPSY;  Surgeon: Stark Klein, MD;  Location: [place];  Service: General;  Laterality: Bilateral;   OSTOMY N/A 11/19/2014   Procedure: OSTOMY;  Surgeon: Michael [name], MD;  [facility];  Service: General;  Laterality:  N/A;   PROCTOSCOPY N/A 04/01/2017   Procedure: RIDGE PROCTOSCOPY;  Surgeon: Michael [name], MD;  [facility];  Service: General;  Laterality: N/A;   REMOVAL OF BILATERAL TISSUE EXPANDERS WITH PLACEMENT OF BILATERAL BREAST IMPLANTS Bilateral 04/16/2016   Procedure: REMOVAL OF BILATERAL TISSUE EXPANDERS WITH PLACEMENT OF BILATERAL BREAST IMPLANTS;  Surgeon: Irene Limbo, MD;  Location: [place];  Service: Plastics;  Laterality: Bilateral;   ROBOTIC ASSISTED LAP VAGINAL HYSTERECTOMY N/A 11/19/2014   Procedure: ROBOTIC LYSIS OF ADHESIONS, CONVERTED TO LAPAROTOMY RADICAL UPPER VAGINECTOMY,LOW ANTERIOR BOWEL RESECTION, COLOSTOMY, BILATERAL URETERAL STENT PLACEMENT AND CYSTONOMY CLOSURE;  Surgeon: Everitt Amber, MD;  [facility];  Service: Gynecology;  Laterality: N/A;   TISSUE EXPANDER FILLING Bilateral 12/26/2015   Procedure: EXPANSION OF BILATERAL CHEST TISSUE EXPANDERS (60 mL- Right; 75 mL- Left);  Surgeon: Irene Limbo, MD;  Location: [place];  Service: Plastics;  Laterality: Bilateral;   TONSILLECTOMY     TOTAL ABDOMINAL HYSTERECTOMY  March 2006   [facility]   and Bilateral Salpingoophorectomy/  staging for Ovarian cancer/  an   XI ROBOTIC ASSISTED LOWER ANTERIOR RESECTION N/A 04/01/2017   Procedure: XI ROBOTIC VS LAPAROSCOPIC COLOSTOMY TAKEDOWN WITH LYSIS OF ADHESIONS.;  Surgeon: Michael [name], MD;  [facility];  Service: General;  Laterality: N/A;  ERAS PATHWAY    SOCIAL HISTORY: Social History   Tobacco Use   Smoking status: Never Smoker   Smokeless tobacco: Never Used  Substance Use Topics   Alcohol use: Not Currently   Drug use: No    FAMILY HISTORY: Family History  Problem Relation Age of Onset   Cancer Mother 56       stomach ca   Hypertension Mother    Cancer Father 65       prostate ca   Diabetes Father    Heart disease Father        CABG   Hypertension Father    Hyperlipidemia Father    Obesity Father      Breast cancer Maternal Aunt        dx in her 73s   Lymphoma Paternal 43    Brain cancer Paternal Grandfather    Ovarian cancer Other    Diabetes Sister    Hypertension Brother y-10   Heart disease Brother        CABG   Diabetes Brother     ROS: Review of Systems  Constitutional: Positive for weight loss.  Endo/Heme/Allergies:       Negative hypoglycemia  Psychiatric/Behavioral: Positive for depression. Negative for suicidal ideas.    PHYSICAL EXAM: Pt in no acute distress  RECENT LABS AND TESTS: BMET    Component Value Date/Time   NA 136 10/04/2018 1345   NA 141 01/24/2017 1228   K 3.6 10/04/2018 1345   K 4.6 01/24/2017 1228   CL 106 10/04/2018 1345   CO2 20 (L) 10/04/2018 1345   CO2 29 01/24/2017 1228   GLUCOSE 132 (H) 10/04/2018 1345   GLUCOSE 84 01/24/2017 1228   BUN 36 (H) 10/04/2018 1345   BUN 22.2 01/24/2017  1228   CREATININE 1.94 (H) 10/04/2018 1345   CREATININE 1.98 (H) 03/27/2018 1324   CREATININE 0.8 01/24/2017 1228   CALCIUM 9.3 10/04/2018 1345   CALCIUM 10.2 01/24/2017 1228   GFRNONAA 27 (L) 10/04/2018 1345   GFRNONAA 26 (L) 03/27/2018 1324   GFRNONAA 78 12/16/2014 1530   GFRAA 31 (L) 10/04/2018 1345   GFRAA 30 (L) 03/27/2018 1324   GFRAA >89 12/16/2014 1530   Lab Results  Component Value Date   HGBA1C 7.9 (H) 09/01/2017   HGBA1C 5.3 04/01/2017   HGBA1C 5.8 (H) 09/05/2015   HGBA1C 6.8 (H) 11/21/2014   HGBA1C 6.3 (H) 11/07/2014   No results found for: INSULIN CBC    Component Value Date/Time   WBC 3.8 (L) 10/04/2018 1345   RBC 3.80 (L) 10/04/2018 1345   HGB 11.2 (L) 10/04/2018 1345   HGB 10.2 (L) 03/27/2018 1324   HGB 12.4 07/29/2017 1444   HCT 36.7 10/04/2018 1345   HCT 38.0 07/29/2017 1444   PLT 282 10/04/2018 1345   PLT 268 03/27/2018 1324   PLT 260 07/29/2017 1444   MCV 96.6 10/04/2018 1345   MCV 90.9 07/29/2017 1444   MCH 29.5 10/04/2018 1345   MCHC 30.5 10/04/2018 1345   RDW 13.7 10/04/2018 1345   RDW 15.5 (H)  07/29/2017 1444   LYMPHSABS 0.7 10/04/2018 1345   LYMPHSABS 0.8 (L) 07/29/2017 1444   MONOABS 0.3 10/04/2018 1345   MONOABS 1.0 (H) 07/29/2017 1444   EOSABS 0.0 10/04/2018 1345   EOSABS 0.0 07/29/2017 1444   BASOSABS 0.1 10/04/2018 1345   BASOSABS 0.0 07/29/2017 1444   Iron/TIBC/Ferritin/ %Sat    Component Value Date/Time   IRON 7 (L) 08/31/2017 0451   IRON 12 (L) 11/29/2014 1251   TIBC 164 (L) 08/31/2017 0451   TIBC 331 11/29/2014 1251   FERRITIN 27 11/29/2014 1251   IRONPCTSAT 4 (L) 08/31/2017 0451   IRONPCTSAT 4 (L) 11/29/2014 1251   Lipid Panel     Component Value Date/Time   CHOL 109 08/28/2015 1617   TRIG 89 04/18/2017 0427   HDL 43.80 08/28/2015 1617   CHOLHDL 2 08/28/2015 1617   VLDL 19.8 08/28/2015 1617   LDLCALC 45 08/28/2015 1617   Hepatic Function Panel     Component Value Date/Time   PROT 8.4 (H) 10/04/2018 1345   PROT 6.9 01/24/2017 1228   ALBUMIN 3.0 (L) 10/04/2018 1345   ALBUMIN 3.4 (L) 01/24/2017 1228   AST 28 10/04/2018 1345   AST 14 (L) 03/27/2018 1324   AST 16 01/24/2017 1228   ALT 55 (H) 10/04/2018 1345   ALT 18 03/27/2018 1324   ALT 14 01/24/2017 1228   ALKPHOS 550 (H) 10/04/2018 1345   ALKPHOS 90 01/24/2017 1228   BILITOT 0.5 10/04/2018 1345   BILITOT 0.2 (L) 03/27/2018 1324   BILITOT 0.34 01/24/2017 1228      Component Value Date/Time   TSH 2.408 08/30/2017 0623   TSH 0.63 08/28/2015 1617      I, Trixie Dredge, am acting as transcriptionist for Dennard Nip, MD I have reviewed the above documentation for accuracy and completeness, and I agree with the above. -Dennard Nip, MD

## 2018-11-01 DIAGNOSIS — Z452 Encounter for adjustment and management of vascular access device: Secondary | ICD-10-CM | POA: Diagnosis not present

## 2018-11-01 DIAGNOSIS — L89312 Pressure ulcer of right buttock, stage 2: Secondary | ICD-10-CM | POA: Diagnosis not present

## 2018-11-01 DIAGNOSIS — L89153 Pressure ulcer of sacral region, stage 3: Secondary | ICD-10-CM | POA: Diagnosis not present

## 2018-11-01 DIAGNOSIS — L89311 Pressure ulcer of right buttock, stage 1: Secondary | ICD-10-CM | POA: Diagnosis not present

## 2018-11-01 DIAGNOSIS — Z933 Colostomy status: Secondary | ICD-10-CM | POA: Diagnosis not present

## 2018-11-01 DIAGNOSIS — C541 Malignant neoplasm of endometrium: Secondary | ICD-10-CM | POA: Diagnosis not present

## 2018-11-01 DIAGNOSIS — T82594D Other mechanical complication of infusion catheter, subsequent encounter: Secondary | ICD-10-CM | POA: Diagnosis not present

## 2018-11-01 DIAGNOSIS — D709 Neutropenia, unspecified: Secondary | ICD-10-CM | POA: Diagnosis not present

## 2018-11-01 DIAGNOSIS — T83592A Infection and inflammatory reaction due to indwelling ureteral stent, initial encounter: Secondary | ICD-10-CM | POA: Diagnosis not present

## 2018-11-01 DIAGNOSIS — B9562 Methicillin resistant Staphylococcus aureus infection as the cause of diseases classified elsewhere: Secondary | ICD-10-CM | POA: Diagnosis not present

## 2018-11-01 DIAGNOSIS — E860 Dehydration: Secondary | ICD-10-CM | POA: Diagnosis not present

## 2018-11-01 DIAGNOSIS — N179 Acute kidney failure, unspecified: Secondary | ICD-10-CM | POA: Diagnosis not present

## 2018-11-01 DIAGNOSIS — E1122 Type 2 diabetes mellitus with diabetic chronic kidney disease: Secondary | ICD-10-CM | POA: Diagnosis not present

## 2018-11-01 DIAGNOSIS — D631 Anemia in chronic kidney disease: Secondary | ICD-10-CM | POA: Diagnosis not present

## 2018-11-01 DIAGNOSIS — N182 Chronic kidney disease, stage 2 (mild): Secondary | ICD-10-CM | POA: Diagnosis not present

## 2018-11-07 ENCOUNTER — Encounter: Payer: Self-pay | Admitting: Internal Medicine

## 2018-11-07 ENCOUNTER — Ambulatory Visit (INDEPENDENT_AMBULATORY_CARE_PROVIDER_SITE_OTHER): Payer: BLUE CROSS/BLUE SHIELD | Admitting: Internal Medicine

## 2018-11-07 ENCOUNTER — Other Ambulatory Visit: Payer: Self-pay

## 2018-11-07 DIAGNOSIS — E860 Dehydration: Secondary | ICD-10-CM | POA: Diagnosis not present

## 2018-11-07 DIAGNOSIS — N3000 Acute cystitis without hematuria: Secondary | ICD-10-CM

## 2018-11-07 DIAGNOSIS — E785 Hyperlipidemia, unspecified: Secondary | ICD-10-CM | POA: Diagnosis not present

## 2018-11-07 DIAGNOSIS — L89309 Pressure ulcer of unspecified buttock, unspecified stage: Secondary | ICD-10-CM | POA: Diagnosis not present

## 2018-11-07 NOTE — Progress Notes (Signed)
Virtual Visit via Telephone Note  I connected with [name] on 11/07/18 at  3:30 PM EDT by telephone and verified that I am speaking with the correct person using two identifiers.   I discussed the limitations, risks, security and privacy concerns of performing an evaluation and management service by telephone and the availability of in person appointments. I also discussed with the patient that there may be a patient responsible charge related to this service. The patient expressed understanding and agreed to proceed.   History of Present Illness: I called and spoke with [name] today.  She has a very complicated history. 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 She underwent TAH-BSO followed by chemotherapy. She developed a pelvic recurrence in 2016 and underwent partial colectomy and colostomy formation followed by radiation therapy. 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 On 04/01/2017 she underwent extensive surgery with lysis of adhesions, colostomy takedown, diverting loop ileostomy formation, reimplantation of her right ureter in the dome of her bladder and stent replacement.  She developed postoperative pelvic abscesses that required percutaneous drainage and a long course of antibiotics to cure.  She also has a history of breast cancer diagnosed last year. She is on Arimidex.  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  Attempts have been made to manage this with a Foley catheter and bilateral percutaneous nephrostomy tubes.  She has had problems with recurrent urinary tract infections and reports that her quality of life has been very poor.  She was evaluated at [facility] last July and a decision was made to proceed with pelvic exenteration, very complex procedure requiring urology, colorectal surgery and plastic and reconstructive  surgery involvement.  Unfortunately, that procedure has never been scheduled and now that the COVID-19 pandemic is upon [place] it is not a current option for her.  Over the past several months she has had problems with bladder spasms and increased drainage which she believes is coming from around her Foley catheter rather than from her rectum.  She describes the drainage as having a foul odor and looking like old blood with mucus.  She is also been having intermittent fevers.  Multiple urine cultures have grown Serratia marcescens.  Blood cultures have been negative.  She has multiple antibiotic allergies limiting her treatment options.  1 month ago I started her on IV ceftriaxone.  She has had slow improvement.  She completed ceftriaxone therapy on 11/04/2018.  Her nephrostomy tubes were changed on 10/27/2018 and her Foley catheter was changed on 11/01/2018.  She did have one episode of bladder spasms with some increased drainage of bloody mucus on 11/02/2018 but overall is feeling much better.  She said that she is fairly certain it is not coming from her rectum.  She says that she does not have any bloody output from her Foley catheter or her nephrostomy tubes.   Observations/Objective:   Assessment and Plan: This is an extremely difficult situation.  I do not feel that it is safe to continue long-term IV antibiotic therapy given the risk of promoting emergence of resistant bacteria or the development of C. difficile colitis.  She has had her current PICC line in for over 1 year.  I will let it remain in for now and observe her closely off of antibiotics  Follow Up Instructions: Observe closely off of antibiotics She will keep  me informed through my chart   I discussed the assessment and treatment plan with the patient. The patient was provided an opportunity to ask questions and all were answered. The patient agreed with the plan and demonstrated an understanding of the instructions.   The patient was  advised to call back or seek an in-person evaluation if the symptoms worsen or if the condition fails to improve as anticipated.  I provided 25 minutes of non-face-to-face time during this encounter.   Michel Bickers, MD

## 2018-11-08 DIAGNOSIS — B9562 Methicillin resistant Staphylococcus aureus infection as the cause of diseases classified elsewhere: Secondary | ICD-10-CM | POA: Diagnosis not present

## 2018-11-08 DIAGNOSIS — L89312 Pressure ulcer of right buttock, stage 2: Secondary | ICD-10-CM | POA: Diagnosis not present

## 2018-11-08 DIAGNOSIS — D631 Anemia in chronic kidney disease: Secondary | ICD-10-CM | POA: Diagnosis not present

## 2018-11-08 DIAGNOSIS — N179 Acute kidney failure, unspecified: Secondary | ICD-10-CM | POA: Diagnosis not present

## 2018-11-08 DIAGNOSIS — E860 Dehydration: Secondary | ICD-10-CM | POA: Diagnosis not present

## 2018-11-08 DIAGNOSIS — T83592A Infection and inflammatory reaction due to indwelling ureteral stent, initial encounter: Secondary | ICD-10-CM | POA: Diagnosis not present

## 2018-11-08 DIAGNOSIS — N182 Chronic kidney disease, stage 2 (mild): Secondary | ICD-10-CM | POA: Diagnosis not present

## 2018-11-08 DIAGNOSIS — E1122 Type 2 diabetes mellitus with diabetic chronic kidney disease: Secondary | ICD-10-CM | POA: Diagnosis not present

## 2018-11-08 DIAGNOSIS — C541 Malignant neoplasm of endometrium: Secondary | ICD-10-CM | POA: Diagnosis not present

## 2018-11-08 DIAGNOSIS — Z452 Encounter for adjustment and management of vascular access device: Secondary | ICD-10-CM | POA: Diagnosis not present

## 2018-11-08 DIAGNOSIS — L89311 Pressure ulcer of right buttock, stage 1: Secondary | ICD-10-CM | POA: Diagnosis not present

## 2018-11-08 DIAGNOSIS — T82594D Other mechanical complication of infusion catheter, subsequent encounter: Secondary | ICD-10-CM | POA: Diagnosis not present

## 2018-11-08 DIAGNOSIS — L89153 Pressure ulcer of sacral region, stage 3: Secondary | ICD-10-CM | POA: Diagnosis not present

## 2018-11-08 DIAGNOSIS — D709 Neutropenia, unspecified: Secondary | ICD-10-CM | POA: Diagnosis not present

## 2018-11-09 DIAGNOSIS — I129 Hypertensive chronic kidney disease with stage 1 through stage 4 chronic kidney disease, or unspecified chronic kidney disease: Secondary | ICD-10-CM | POA: Diagnosis not present

## 2018-11-09 DIAGNOSIS — E11319 Type 2 diabetes mellitus with unspecified diabetic retinopathy without macular edema: Secondary | ICD-10-CM | POA: Diagnosis not present

## 2018-11-09 DIAGNOSIS — E1122 Type 2 diabetes mellitus with diabetic chronic kidney disease: Secondary | ICD-10-CM | POA: Diagnosis not present

## 2018-11-09 DIAGNOSIS — N183 Chronic kidney disease, stage 3 (moderate): Secondary | ICD-10-CM | POA: Diagnosis not present

## 2018-11-14 ENCOUNTER — Telehealth: Payer: Self-pay | Admitting: *Deleted

## 2018-11-14 NOTE — Telephone Encounter (Signed)
Copied from [place] [id number]. Topic: Appointment Scheduling - Scheduling Inquiry for Clinic >> Nov 14, 2018  1:28 PM Celene Kras A wrote: Reason for CRM: Pt called about her appt on 11/21/2018. Pt would prefer to make this a virtual visit since she is immunocompromised. Pt states that if a urine sample is needed she does have a [facility] who could deliver the sample for her. Please advise.

## 2018-11-15 DIAGNOSIS — L89312 Pressure ulcer of right buttock, stage 2: Secondary | ICD-10-CM | POA: Diagnosis not present

## 2018-11-15 DIAGNOSIS — N179 Acute kidney failure, unspecified: Secondary | ICD-10-CM | POA: Diagnosis not present

## 2018-11-15 DIAGNOSIS — L89311 Pressure ulcer of right buttock, stage 1: Secondary | ICD-10-CM | POA: Diagnosis not present

## 2018-11-15 DIAGNOSIS — C541 Malignant neoplasm of endometrium: Secondary | ICD-10-CM | POA: Diagnosis not present

## 2018-11-15 DIAGNOSIS — T82594D Other mechanical complication of infusion catheter, subsequent encounter: Secondary | ICD-10-CM | POA: Diagnosis not present

## 2018-11-15 DIAGNOSIS — E1122 Type 2 diabetes mellitus with diabetic chronic kidney disease: Secondary | ICD-10-CM | POA: Diagnosis not present

## 2018-11-15 DIAGNOSIS — B9562 Methicillin resistant Staphylococcus aureus infection as the cause of diseases classified elsewhere: Secondary | ICD-10-CM | POA: Diagnosis not present

## 2018-11-15 DIAGNOSIS — Z452 Encounter for adjustment and management of vascular access device: Secondary | ICD-10-CM | POA: Diagnosis not present

## 2018-11-15 DIAGNOSIS — L89153 Pressure ulcer of sacral region, stage 3: Secondary | ICD-10-CM | POA: Diagnosis not present

## 2018-11-15 DIAGNOSIS — T83592A Infection and inflammatory reaction due to indwelling ureteral stent, initial encounter: Secondary | ICD-10-CM | POA: Diagnosis not present

## 2018-11-15 DIAGNOSIS — N182 Chronic kidney disease, stage 2 (mild): Secondary | ICD-10-CM | POA: Diagnosis not present

## 2018-11-15 DIAGNOSIS — D709 Neutropenia, unspecified: Secondary | ICD-10-CM | POA: Diagnosis not present

## 2018-11-15 DIAGNOSIS — E860 Dehydration: Secondary | ICD-10-CM | POA: Diagnosis not present

## 2018-11-15 DIAGNOSIS — D631 Anemia in chronic kidney disease: Secondary | ICD-10-CM | POA: Diagnosis not present

## 2018-11-15 NOTE — Telephone Encounter (Signed)
Patient notified that we will do virtual on 11/21/18

## 2018-11-16 ENCOUNTER — Encounter (INDEPENDENT_AMBULATORY_CARE_PROVIDER_SITE_OTHER): Payer: Self-pay | Admitting: Family Medicine

## 2018-11-16 ENCOUNTER — Ambulatory Visit (INDEPENDENT_AMBULATORY_CARE_PROVIDER_SITE_OTHER): Payer: BLUE CROSS/BLUE SHIELD | Admitting: Family Medicine

## 2018-11-16 ENCOUNTER — Other Ambulatory Visit: Payer: Self-pay

## 2018-11-16 DIAGNOSIS — E119 Type 2 diabetes mellitus without complications: Secondary | ICD-10-CM

## 2018-11-16 DIAGNOSIS — F3289 Other specified depressive episodes: Secondary | ICD-10-CM

## 2018-11-16 DIAGNOSIS — Z6834 Body mass index (BMI) 34.0-34.9, adult: Secondary | ICD-10-CM | POA: Diagnosis not present

## 2018-11-16 DIAGNOSIS — E669 Obesity, unspecified: Secondary | ICD-10-CM | POA: Diagnosis not present

## 2018-11-16 MED ORDER — BUPROPION HCL ER (SR) 200 MG PO TB12: 200.0000 mg | ORAL_TABLET | Freq: Every day | ORAL | 0 refills | Status: DC

## 2018-11-16 NOTE — Progress Notes (Signed)
Office: [phone]  /  Fax: [phone] TeleHealth Visit:  [name] has verbally consented to this TeleHealth visit today. The patient is located at home, the provider is located at the [employer] [facility] office. The participants in this visit include the listed provider and patient. [name] was unable to use realtime audiovisual technology today and the telehealth visit was conducted via telephone.  HPI:   Chief Complaint: OBESITY [name] is here to discuss her progress with her obesity treatment plan. She is keeping a food journal with 1200 to 1300 calories and 75+ grams of protein and is following her eating plan approximately 70 to 75 % of the time. She states she is exercising 0 minutes 0 times per week. [name] feels that she has done well maintaining her weight, but notes increased temptations and boredom eating.  We were unable to weigh the patient today for this TeleHealth visit. She feels as if she has maintained weight since her last visit. She has lost 18 lbs since starting treatment with [place].  Diabetes II [name] has a diagnosis of diabetes type II. [name] states that her blood sugars are in the 90's. Her last A1c was elevated at 7.9 on 09/01/17, and she has been doing better on her diet prescription. She denies any hypoglycemic episodes. She has been working on intensive lifestyle modifications including diet, exercise, and weight loss to help control her blood glucose levels.  Depression with emotional eating behaviors [name] is feeling a little down due to health issues and isolation. She is trying to occupy herself and avoid emotional eating and using food for comfort to the extent that it is negatively impacting her health. She often snacks when she is not hungry. [name] sometimes feels she is out of control and then feels guilty that she made poor food choices. She has been working on behavior modification techniques to help reduce her emotional eating and has been somewhat  successful.   Depression screen [facility] 2/9 11/07/2018 04/20/2018 04/05/2018 06/21/2017 08/30/2016  Decreased Interest 0 0 1 0 0  Down, Depressed, Hopeless 0 1 1 0 0  PHQ - 2 Score 0 1 2 0 0  Altered sleeping - 0 0 - -  Tired, decreased energy - 1 1 - -  Change in appetite - 0 1 - -  Feeling bad or failure about yourself  - 0 0 - -  Trouble concentrating - 0 0 - -  Moving slowly or fidgety/restless - 0 0 - -  Suicidal thoughts - 0 0 - -  PHQ-9 Score - 2 4 - -  Difficult doing work/chores - - Somewhat difficult - -  Some recent data might be hidden     ASSESSMENT AND PLAN:  Type 2 diabetes mellitus without complication, without long-term current use of insulin (HCC)  Other depression - with emotional eating - Plan: buPROPion (WELLBUTRIN SR) 200 MG 12 hr tablet  Class 1 obesity with serious comorbidity and body mass index (BMI) of 34.0 to 34.9 in adult, unspecified obesity type  PLAN:  Diabetes II [name] has been given extensive diabetes education by myself today including ideal fasting and post-prandial blood glucose readings, individual ideal Hgb A1c goals, and hypoglycemia prevention. We discussed the importance of good blood sugar control to decrease the likelihood of diabetic complications such as nephropathy, neuropathy, limb loss, blindness, coronary artery disease, and death. We discussed the importance of intensive lifestyle modification including diet, exercise, and weight loss as the first line treatment for  diabetes. [name] agrees to continue her diet, exercise, and diabetes medications. We will recheck labs in 1 to 2 months and [name] will follow up at the agreed upon time in 3 weeks.  Depression with Emotional Eating Behaviors We discussed behavior modification techniques today to help [name] deal with her emotional eating and depression. She has agreed to take Wellbutrin SR 200 mg qd #30 with no refills and agreed to follow up as directed.  Obesity [name] is currently in the action  stage of change. As such, her goal is to continue with weight loss efforts. She has agreed to keep a food journal with 1200 to 1300 calories and 75+ grams of protein.  [name] has been instructed to work up to a goal of 150 minutes of combined cardio and strengthening exercise per week for weight loss and overall health benefits. We discussed the following Behavioral Modification Strategies today: increasing lean protein intake, decreasing simple carbohydrates, increasing vegetables, work on meal planning and easy cooking plans, emotional eating strategies, ways to avoid boredom eating, keeping healthy foods in the home, and ways to avoid night time snacking.  [name] has agreed to follow up with our clinic in 3 weeks. She was informed of the importance of frequent follow up visits to maximize her success with intensive lifestyle modifications for her multiple health conditions.  ALLERGIES: Allergies  Allergen Reactions   Penicillins Swelling    Facial swelling/childhood allergy Has patient had a PCN reaction causing immediate rash, facial/tongue/throat swelling, SOB or lightheadedness with hypotension: Yes Has patient had a PCN reaction causing severe rash involving mucus membranes or skin necrosis: Yes Has patient had a PCN reaction that required hospitalization yes Has patient had a PCN reaction occurring within the last 10 years: No If all of the above answers are "NO", then may proceed with Cephalosporin use.    Cefaclor Rash    Ceclor   Erythromycin Other (See Comments)    Gastritis, abd cramps   Tape Rash    blisters   Trimethoprim Rash   Ultram [Tramadol] Hives   Cephalosporins Rash   Fluconazole Rash   Oxycodone Other (See Comments)    " I just feel weird"   Pectin Rash    Pectin ring for stoma   Septra [Sulfamethoxazole-Trimethoprim] Rash   Sulfa Antibiotics Rash    MEDICATIONS: Current Outpatient Medications on File Prior to Visit  Medication Sig Dispense  Refill   acetaminophen (TYLENOL) 325 MG tablet Take 2 tablets (650 mg total) by mouth every 6 (six) hours as needed. 30 tablet 1   anastrozole (ARIMIDEX) 1 MG tablet TAKE 1 TABLET DAILY (Patient taking differently: Take 1 mg by mouth daily. ) 90 tablet 3   Biotin 5 MG TABS Take 5 mg by mouth every morning.      Cholecalciferol (VITAMIN D3) 10000 UNITS capsule Take 10,000 Units by mouth once a week. Sunday evening's     clobetasol (OLUX) 0.05 % topical foam Apply topically 2 (two) times daily.     diphenhydrAMINE (BENADRYL) 25 mg capsule Take 1 capsule (25 mg total) by mouth every 8 (eight) hours as needed for itching, allergies or sleep. (Patient not taking: Reported on 11/07/2018) 30 capsule 0   diphenoxylate-atropine (LOMOTIL) 2.5-0.025 MG tablet Take 1 tablet by mouth 4 (four) times daily as needed for diarrhea or loose stools. TO PREVENT LOOSE BOWEL MOVEMENTS (Patient not taking: Reported on 11/07/2018) 30 tablet 0   fentaNYL (DURAGESIC) 50 MCG/HR Place 1 patch onto the skin every 3 (  three) days. 10 patch 0   ferrous sulfate 325 (65 FE) MG tablet Take 1 tablet (325 mg total) by mouth at bedtime. 30 tablet 3   filgrastim-sndz (ZARXIO) 480 MCG/0.8ML SOSY injection Inject 0.8 mLs (480 mcg total) into the skin as directed. Inject 480 mcg every 5 days 0.8 mL 99   GRANIX 480 MCG/0.8ML SOSY injection      JANUVIA 50 MG tablet Take 50 mg by mouth daily.      levothyroxine (SYNTHROID, LEVOTHROID) 150 MCG tablet Take 1 tablet (150 mcg total) by mouth daily before breakfast. 30 tablet 1   loratadine (CLARITIN) 10 MG tablet Take 10 mg by mouth every morning.      nitrofurantoin, macrocrystal-monohydrate, (MACROBID) 100 MG capsule Take 100 mg by mouth at bedtime.     omega-3 acid ethyl esters (LOVAZA) 1 G capsule Take 1 g by mouth 2 (two) times daily.      oxybutynin (DITROPAN) 5 MG tablet      Polyethyl Glycol-Propyl Glycol (SYSTANE OP) Place 1 drop into both eyes daily as needed (dry eyes).       pregabalin (LYRICA) 50 MG capsule TAKE 1 CAPSULE(50 MG) BY MOUTH TWICE DAILY 180 capsule 3   Prenatal Vit-Fe Fumarate-FA (PRENATAL VITAMIN PO) Take 1 capsule by mouth daily. Takes prenatal because there are no dyes in it     rosuvastatin (CRESTOR) 10 MG tablet Take 10 mg by mouth every evening.      Saccharomyces boulardii (FLORASTOR PO) Take 1 capsule by mouth daily.     sodium bicarbonate 650 MG tablet Take 650 mg by mouth 2 (two) times daily.     sodium chloride 0.9 % injection Inject 10 mLs into the vein daily.     No current facility-administered medications on file prior to visit.     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+, HER2 negative/  12-11-2015 bilateral mastecotmy w/ reconstruction (no radiation and no chemo)   Cancer of corpus uteri, except isthmus [facility])  oncologist-- dr Denman [name] and dr Alvy Bimler    10-15-2004  dx endometroid endometrial and ovarian cancer s/p  chemotheapy and surgery(TAH w/ BSO) :  recurrent 11-19-2014 post pelvic surgery and radiation 01-29-2015 to 03-10-2015   Chronic idiopathic neutropenia ([place])    presumed related to chemotherapy March 2006--- followed by dr Alvy Bimler (treatment w/ G-CSF injections   Chronic nausea    Chronic pain    perineal/ anal  area from bladder pad irritates skin , right flank pain   CKD stage G2/A3, GFR 60-89 and albumin creatinine ratio >300 mg/g    nephrologist-  dr Madelon Lips   Colovesical fistula    Diabetic retinopathy, background ([place])    Difficult intravenous access    small veins--- hx PICC lines   DM type 2 (diabetes mellitus, type 2) ([place])    monitored by dr Legrand Como altheimer   Dysuria    Environmental and  seasonal allergies    Fatty liver 02/08/2016   CT   Generalized muscle weakness    GERD (gastroesophageal reflux disease)    Hiatal hernia    History of abdominal abscess 04/16/2017   post surgery 04-01-2017  --- resolved  10/ 2018   History of gastric polyp    2014  duodenum   History of ileus 04/16/2017   resolved w/ no surgical intervention   History of radiation therapy    01-29-2015 to 03-10-2015  pelvis 50.4Gy   Hypothyroidism    monitored by dr Legrand Como altheimer   IBS (irritable bowel syndrome)    Ileostomy in place [facility]) 04/01/2017   created at same time colostomy takedown.   Joint pain    Leg edema    Lower urinary tract symptoms (LUTS)    urge urinary  incontinence   Mixed dyslipidemia    Multiple thyroid nodules    Managed by Dr. Harlow Asa   Nephrostomy status [facility])    Palpitations    Pelvic abscess in female 04/16/2017   PONV (postoperative nausea and vomiting)    "scopolamine patch works for me"   Radiation-induced dermatitis    contact dermatitis , radiation completed, rash only on ankles now.   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   Vitamin D deficiency    Wears glasses     PAST SURGICAL HISTORY: Past Surgical History:  Procedure Laterality Date   APPENDECTOMY     biopsy thyroid nodules     BREAST RECONSTRUCTION WITH PLACEMENT OF TISSUE EXPANDER AND FLEX HD (ACELLULAR HYDRATED DERMIS) Bilateral 12/11/2015   Procedure: BILATERAL BREAST RECONSTRUCTION WITH PLACEMENT OF TISSUE EXPANDERS;  Surgeon: Irene Limbo, MD;  Location: [place];  Service: Plastics;  Laterality: Bilateral;   COLONOSCOPY WITH PROPOFOL N/A 08/21/2013   Procedure: COLONOSCOPY WITH PROPOFOL;  Surgeon: Cleotis Nipper, MD;  [facility];  Service: Endoscopy;  Laterality: N/A;   COLOSTOMY TAKEDOWN N/A 12/04/2014   Procedure: LAPROSCOPIC  LYSIS OF ADHESIONS, SPLENIC MOBILIZATION, RELOCATION OF COLOSTOMY, DEBRIDEMENT INITIAL COLOSTOMY SITE;  Surgeon: Michael [name], MD;  [facility];  Service: General;  Laterality: N/A;   CYSTOGRAM N/A 06/01/2017   Procedure: CYSTOGRAM;  Surgeon: Alexis Frock, MD;  [facility];  Service: Urology;  Laterality: N/A;   CYSTOSCOPY W/ RETROGRADES Right 11/21/2015   Procedure: CYSTOSCOPY WITH RETROGRADE PYELOGRAM;  Surgeon: Alexis Frock, MD;  [facility];  Service: Urology;  Laterality: Right;   CYSTOSCOPY W/ URETERAL STENT PLACEMENT Right 11/21/2015   Procedure: CYSTOSCOPY WITH STENT REPLACEMENT;  Surgeon: Alexis Frock, MD;  [facility];  Service: Urology;  Laterality: Right;   CYSTOSCOPY W/ URETERAL STENT PLACEMENT Right 03/10/2016   Procedure: CYSTOSCOPY WITH STENT REPLACEMENT;  Surgeon: Alexis Frock, MD;  Location: [facility];  Service: Urology;  Laterality: Right;   CYSTOSCOPY W/ URETERAL STENT PLACEMENT Right 06/30/2016   Procedure: CYSTOSCOPY WITH RETROGRADE PYELOGRAM/URETERAL STENT EXCHANGE;  Surgeon: Alexis Frock, MD;  Location: [facility];  Service: Urology;  Laterality: Right;   CYSTOSCOPY W/ URETERAL STENT PLACEMENT N/A 06/01/2017   Procedure: CYSTOSCOPY WITH EXAM UNDER ANESTHESIA;  Surgeon: Alexis Frock, MD;  [facility];  Service: Urology;  Laterality: N/A;   CYSTOSCOPY W/ URETERAL STENT PLACEMENT Right 08/17/2017   Procedure: CYSTOSCOPY WITH RETROGRADE PYELOGRAM/URETERAL STENT REMOVAL;  Surgeon: Alexis Frock, MD;  Location: [facility];  Service: Urology;  Laterality: Right;   CYSTOSCOPY WITH RETROGRADE PYELOGRAM, URETEROSCOPY AND STENT PLACEMENT Right 03/20/2015   Procedure: CYSTOSCOPY WITH RETROGRADE PYELOGRAM, URETEROSCOPY WITH BALLOON DILATION AND STENT PLACEMENT ON RIGHT;  Surgeon: Alexis Frock, MD;  [facility];  Service: Urology;  Laterality: Right;   CYSTOSCOPY WITH  RETROGRADE PYELOGRAM, URETEROSCOPY AND STENT PLACEMENT Right 05/02/2015   Procedure: CYSTOSCOPY WITH RIGHT RETROGRADE PYELOGRAM,  DIAGNOSTIC URETEROSCOPY AND STENT PULL ;  Surgeon: Alexis Frock, MD;  Location: [facility];  Service: Urology;  Laterality: Right;   CYSTOSCOPY WITH RETROGRADE PYELOGRAM, URETEROSCOPY AND STENT PLACEMENT Right 09/05/2015   Procedure: CYSTOSCOPY WITH RETROGRADE PYELOGRAM,  AND STENT PLACEMENT;  Surgeon: Alexis Frock, MD;  [facility];  Service: Urology;  Laterality: Right;   CYSTOSCOPY WITH RETROGRADE PYELOGRAM, URETEROSCOPY AND STENT PLACEMENT Right 04/01/2017   Procedure: CYSTOSCOPY WITH RETROGRADE PYELOGRAM, URETEROSCOPY AND STENT PLACEMENT;  Surgeon: Alexis Frock, MD;  [facility];  Service: Urology;  Laterality: Right;   CYSTOSCOPY WITH STENT PLACEMENT Right 10/27/2016   Procedure: CYSTOSCOPY WITH STENT CHANGE and right retrograde pyelogram;  Surgeon: Alexis Frock, MD;  Location: [facility];  Service: Urology;  Laterality: Right;   EUS N/A 10/02/2014   Procedure: LOWER ENDOSCOPIC ULTRASOUND (EUS);  Surgeon: Arta Silence, MD;  [facility];  Service: Endoscopy;  Laterality: N/A;   EXCISION SOFT TISSUE MASS RIGHT FOREMAN  12-08-2006   EYE SURGERY  as child   pytosis of eyelids repair   INCISION AND DRAINAGE OF WOUND Bilateral 12/26/2015   Procedure: DEBRIDEMENT OF BILATERAL MASTECTOMY FLAPS;  Surgeon: Irene Limbo, MD;  Location: [place];  Service: Plastics;  Laterality: Bilateral;   IR CV LINE INJECTION  05/31/2017   IR FLUORO GUIDE CV LINE LEFT  05/31/2017   IR FLUORO GUIDE CV LINE RIGHT  04/06/2017   IR FLUORO GUIDE CV MIDLINE PICC RIGHT  05/30/2017   IR NEPHROSTOGRAM LEFT INITIAL PLACEMENT  09/02/2017   IR NEPHROSTOGRAM LEFT THRU EXISTING ACCESS  11/29/2017   IR NEPHROSTOGRAM RIGHT INITIAL PLACEMENT  09/02/2017   IR NEPHROSTOGRAM RIGHT THRU EXISTING ACCESS  09/13/2017   IR  NEPHROSTOGRAM RIGHT THRU EXISTING ACCESS  11/29/2017   IR NEPHROSTOMY EXCHANGE LEFT  11/28/2017   IR NEPHROSTOMY EXCHANGE LEFT  01/05/2018   IR NEPHROSTOMY EXCHANGE LEFT  02/16/2018   IR NEPHROSTOMY EXCHANGE LEFT  03/30/2018   IR NEPHROSTOMY EXCHANGE LEFT  05/12/2018   IR NEPHROSTOMY EXCHANGE LEFT  06/21/2018   IR NEPHROSTOMY EXCHANGE LEFT  08/04/2018   IR NEPHROSTOMY EXCHANGE LEFT  09/18/2018   IR NEPHROSTOMY EXCHANGE LEFT  10/09/2018   IR NEPHROSTOMY EXCHANGE LEFT  10/27/2018   IR NEPHROSTOMY EXCHANGE RIGHT  10/02/2017   IR NEPHROSTOMY EXCHANGE RIGHT  11/28/2017   IR NEPHROSTOMY EXCHANGE RIGHT  01/05/2018   IR NEPHROSTOMY EXCHANGE RIGHT  02/16/2018   IR NEPHROSTOMY EXCHANGE RIGHT  03/30/2018   IR NEPHROSTOMY EXCHANGE RIGHT  05/12/2018   IR NEPHROSTOMY EXCHANGE RIGHT  06/21/2018   IR NEPHROSTOMY EXCHANGE RIGHT  08/04/2018   IR NEPHROSTOMY EXCHANGE RIGHT  09/18/2018   IR NEPHROSTOMY EXCHANGE RIGHT  10/27/2018   IR NEPHROSTOMY PLACEMENT LEFT  10/02/2017   IR RADIOLOGIST EVAL & MGMT  05/03/2017   IR US GUIDE VASC ACCESS LEFT  05/31/2017   IR US GUIDE VASC ACCESS RIGHT  04/06/2017   IR US GUIDE VASC ACCESS RIGHT  05/30/2017   LAPAROSCOPIC CHOLECYSTECTOMY  1990   LIPOSUCTION WITH LIPOFILLING Bilateral 04/16/2016   Procedure: LIPOSUCTION WITH LIPOFILLING TO BILATERAL CHEST;  Surgeon: Irene Limbo, MD;  Location: [place];  Service: Plastics;  Laterality: Bilateral;   MASTECTOMY W/ SENTINEL NODE BIOPSY Bilateral 12/11/2015   Procedure: RIGHT PROPHYLACTIC MASTECTOMY, LEFT TOTAL MASTECTOMY WITH LEFT AXILLARY SENTINEL LYMPH  NODE BIOPSY;  Surgeon: Stark Klein, MD;  Location: [place];  Service: General;  Laterality: Bilateral;   OSTOMY N/A 11/19/2014   Procedure: OSTOMY;  Surgeon: Michael [name], MD;  [facility];  Service: General;  Laterality: N/A;   PROCTOSCOPY N/A 04/01/2017   Procedure: RIDGE PROCTOSCOPY;  Surgeon: Michael [name], MD;  [facility];  Service:  General;  Laterality: N/A;   REMOVAL OF BILATERAL TISSUE EXPANDERS WITH PLACEMENT OF BILATERAL BREAST IMPLANTS Bilateral 04/16/2016   Procedure: REMOVAL OF BILATERAL TISSUE EXPANDERS WITH PLACEMENT OF BILATERAL BREAST IMPLANTS;  Surgeon: Irene Limbo, MD;  Location: [place];  Service: Plastics;  Laterality: Bilateral;   ROBOTIC ASSISTED LAP VAGINAL HYSTERECTOMY N/A 11/19/2014   Procedure: ROBOTIC LYSIS OF ADHESIONS, CONVERTED TO LAPAROTOMY RADICAL UPPER VAGINECTOMY,LOW ANTERIOR BOWEL RESECTION, COLOSTOMY, BILATERAL URETERAL STENT PLACEMENT AND CYSTONOMY CLOSURE;  Surgeon: Everitt Amber, MD;  [facility];  Service: Gynecology;  Laterality: N/A;   TISSUE EXPANDER FILLING Bilateral 12/26/2015   Procedure: EXPANSION OF BILATERAL CHEST TISSUE EXPANDERS (60 mL- Right; 75 mL- Left);  Surgeon: Irene Limbo, MD;  Location: [place];  Service: Plastics;  Laterality: Bilateral;   TONSILLECTOMY     TOTAL ABDOMINAL HYSTERECTOMY  March 2006   [facility]   and Bilateral Salpingoophorectomy/  staging for Ovarian cancer/  an   XI ROBOTIC ASSISTED LOWER ANTERIOR RESECTION N/A 04/01/2017   Procedure: XI ROBOTIC VS LAPAROSCOPIC COLOSTOMY TAKEDOWN WITH LYSIS OF ADHESIONS.;  Surgeon: Michael [name], MD;  [facility];  Service: General;  Laterality: N/A;  ERAS PATHWAY    SOCIAL HISTORY: Social History   Tobacco Use   Smoking status: Never Smoker   Smokeless tobacco: Never Used  Substance Use Topics   Alcohol use: Not Currently   Drug use: No    FAMILY HISTORY: Family History  Problem Relation Age of Onset   Cancer Mother 72       stomach ca   Hypertension Mother    Cancer Father 39       prostate ca   Diabetes Father    Heart disease Father        CABG   Hypertension Father    Hyperlipidemia Father    Obesity Father    Breast cancer Maternal Aunt        dx in her 75s   Lymphoma Paternal 22    Brain cancer Paternal Grandfather     Ovarian cancer Other    Diabetes Sister    Hypertension Brother y-10   Heart disease Brother        CABG   Diabetes Brother     ROS: Review of Systems  Endo/Heme/Allergies:       Negative for hypoglycemia.  Psychiatric/Behavioral: Positive for depression.    PHYSICAL EXAM: Pt in no acute distress  RECENT LABS AND TESTS: BMET    Component Value Date/Time   NA 136 10/04/2018 1345   NA 141 01/24/2017 1228   K 3.6 10/04/2018 1345   K 4.6 01/24/2017 1228   CL 106 10/04/2018 1345   CO2 20 (L) 10/04/2018 1345   CO2 29 01/24/2017 1228   GLUCOSE 132 (H) 10/04/2018 1345   GLUCOSE 84 01/24/2017 1228   BUN 36 (H) 10/04/2018 1345   BUN 22.2 01/24/2017 1228   CREATININE 1.94 (H) 10/04/2018 1345   CREATININE 1.98 (H) 03/27/2018 1324   CREATININE 0.8 01/24/2017 1228   CALCIUM 9.3 10/04/2018 1345   CALCIUM 10.2 01/24/2017 1228   GFRNONAA 27 (L)  10/04/2018 1345   GFRNONAA 26 (L) 03/27/2018 1324   GFRNONAA 78 12/16/2014 1530   GFRAA 31 (L) 10/04/2018 1345   GFRAA 30 (L) 03/27/2018 1324   GFRAA >89 12/16/2014 1530   Lab Results  Component Value Date   HGBA1C 7.9 (H) 09/01/2017   HGBA1C 5.3 04/01/2017   HGBA1C 5.8 (H) 09/05/2015   HGBA1C 6.8 (H) 11/21/2014   HGBA1C 6.3 (H) 11/07/2014   No results found for: INSULIN CBC    Component Value Date/Time   WBC 3.8 (L) 10/04/2018 1345   RBC 3.80 (L) 10/04/2018 1345   HGB 11.2 (L) 10/04/2018 1345   HGB 10.2 (L) 03/27/2018 1324   HGB 12.4 07/29/2017 1444   HCT 36.7 10/04/2018 1345   HCT 38.0 07/29/2017 1444   PLT 282 10/04/2018 1345   PLT 268 03/27/2018 1324   PLT 260 07/29/2017 1444   MCV 96.6 10/04/2018 1345   MCV 90.9 07/29/2017 1444   MCH 29.5 10/04/2018 1345   MCHC 30.5 10/04/2018 1345   RDW 13.7 10/04/2018 1345   RDW 15.5 (H) 07/29/2017 1444   LYMPHSABS 0.7 10/04/2018 1345   LYMPHSABS 0.8 (L) 07/29/2017 1444   MONOABS 0.3 10/04/2018 1345   MONOABS 1.0 (H) 07/29/2017 1444   EOSABS 0.0 10/04/2018 1345   EOSABS  0.0 07/29/2017 1444   BASOSABS 0.1 10/04/2018 1345   BASOSABS 0.0 07/29/2017 1444   Iron/TIBC/Ferritin/ %Sat    Component Value Date/Time   IRON 7 (L) 08/31/2017 0451   IRON 12 (L) 11/29/2014 1251   TIBC 164 (L) 08/31/2017 0451   TIBC 331 11/29/2014 1251   FERRITIN 27 11/29/2014 1251   IRONPCTSAT 4 (L) 08/31/2017 0451   IRONPCTSAT 4 (L) 11/29/2014 1251   Lipid Panel     Component Value Date/Time   CHOL 109 08/28/2015 1617   TRIG 89 04/18/2017 0427   HDL 43.80 08/28/2015 1617   CHOLHDL 2 08/28/2015 1617   VLDL 19.8 08/28/2015 1617   LDLCALC 45 08/28/2015 1617   Hepatic Function Panel     Component Value Date/Time   PROT 8.4 (H) 10/04/2018 1345   PROT 6.9 01/24/2017 1228   ALBUMIN 3.0 (L) 10/04/2018 1345   ALBUMIN 3.4 (L) 01/24/2017 1228   AST 28 10/04/2018 1345   AST 14 (L) 03/27/2018 1324   AST 16 01/24/2017 1228   ALT 55 (H) 10/04/2018 1345   ALT 18 03/27/2018 1324   ALT 14 01/24/2017 1228   ALKPHOS 550 (H) 10/04/2018 1345   ALKPHOS 90 01/24/2017 1228   BILITOT 0.5 10/04/2018 1345   BILITOT 0.2 (L) 03/27/2018 1324   BILITOT 0.34 01/24/2017 1228      Component Value Date/Time   TSH 2.408 08/30/2017 0623   TSH 0.63 08/28/2015 1617    Results for ASIYA, CUTBIRTH (MRN [id number]) as of 11/16/2018 12:41  Ref. Range 04/05/2018 11:45  Vitamin D, 25-Hydroxy Latest Ref Range: 30.0 - 100.0 ng/mL 40.8    I, Marcille Blanco, CMA, am acting as transcriptionist for Starlyn Skeans, MD I have reviewed the above documentation for accuracy and completeness, and I agree with the above. -Dennard Nip, MD

## 2018-11-17 ENCOUNTER — Telehealth: Payer: Self-pay

## 2018-11-17 NOTE — Telephone Encounter (Signed)
Called [employer] regarding fax for prior authorization for Granix, patient currently taking Zarxio. Talked with representative and prior authorization not needed due to patient taking Zarxio.

## 2018-11-19 IMAGING — XA IR EXCHANGE NEPHROSTOMY RIGHT
1 series · 6 of 6 positions shown · non-contrast
Comparison: None.

INDICATION: 62-year-old female with a history of endometrial cancer complicated
by bilateral ureteral obstruction. Bilateral percutaneous
nephrostomy tubes were placed on 09/02/2017. She presents today for
routine exchange. Of note, she has had significant difficulties with
her right-sided percutaneous nephrostomy tube which has been painful
since the time of placement.

EXAM:
Bilateral nephrostomy tube exchange

[Series 300: tube placements · 6 of 6 slices shown]
[im 1/6]
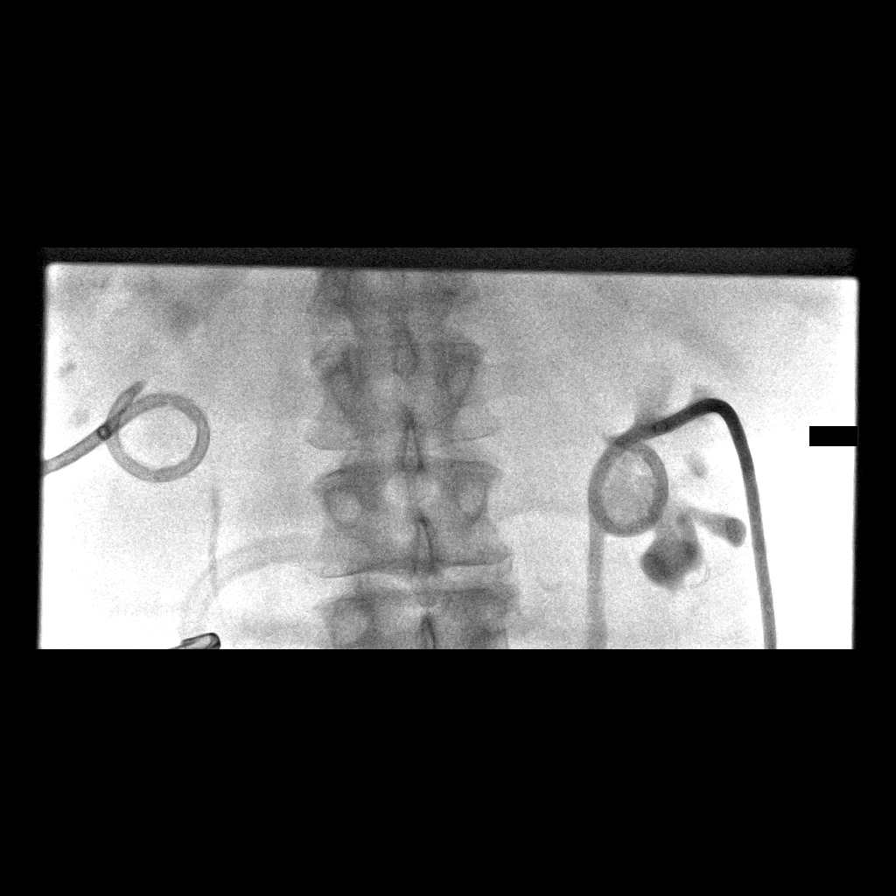
[im 2/6]
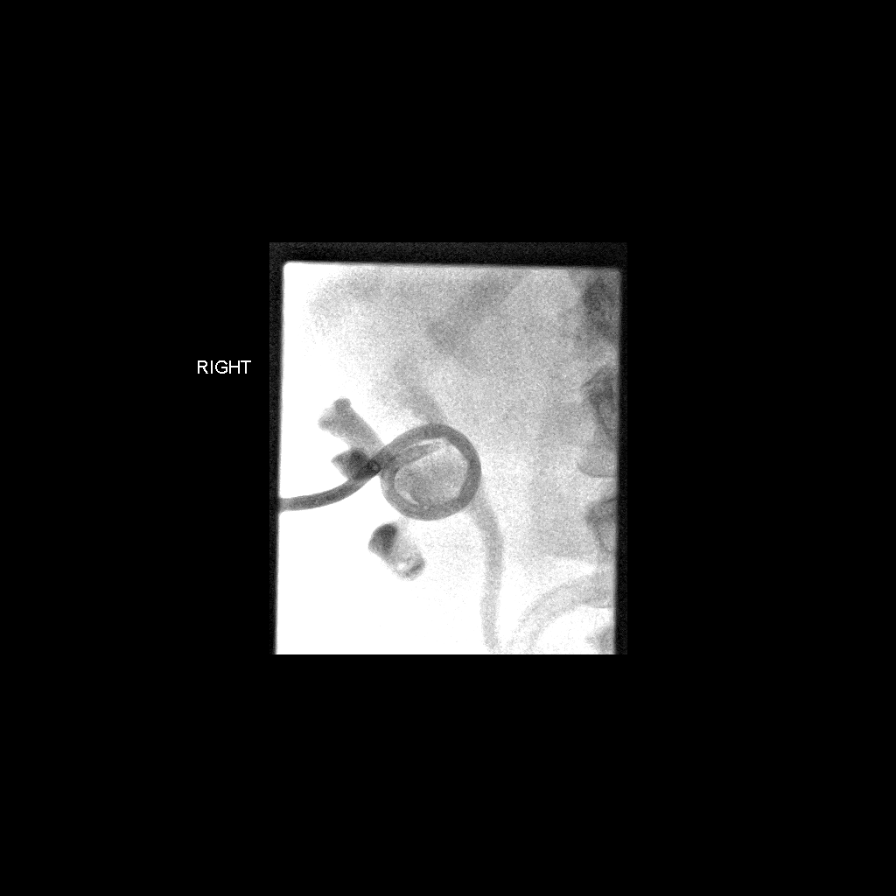
[im 3/6]
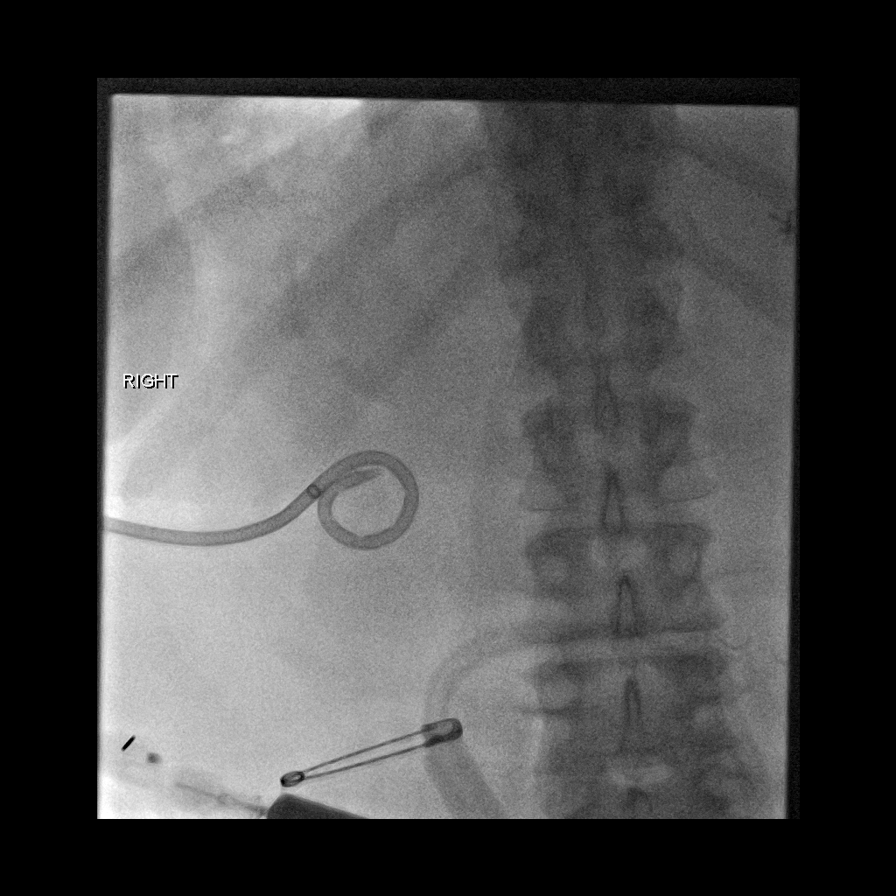
[im 4/6]
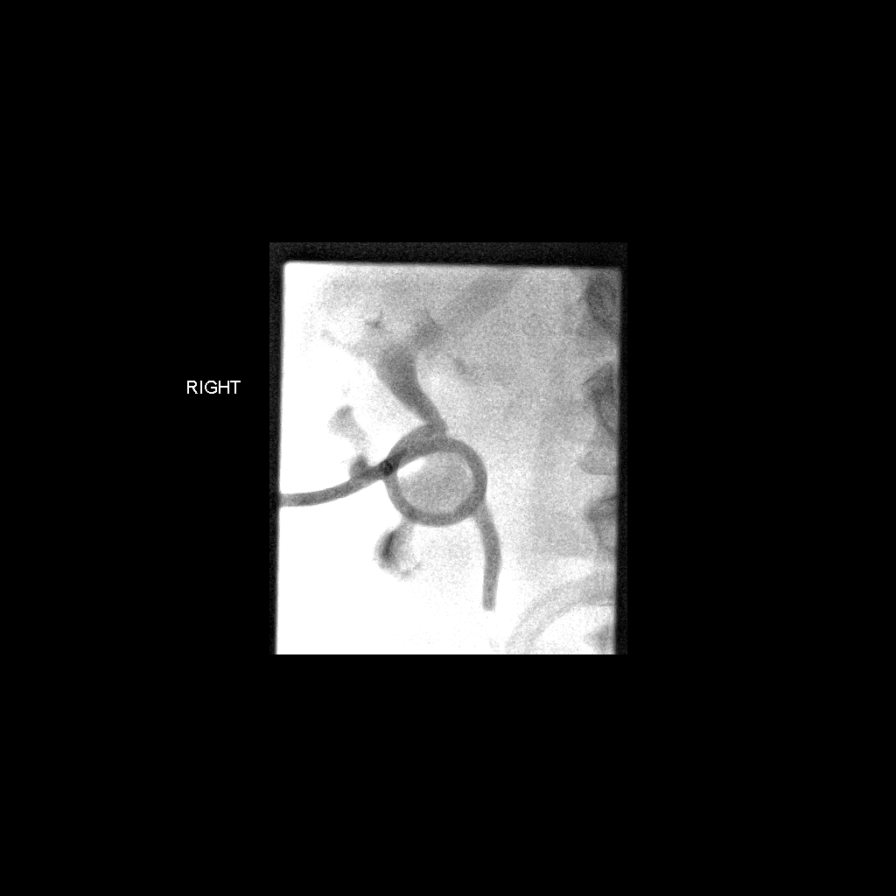
[im 5/6]
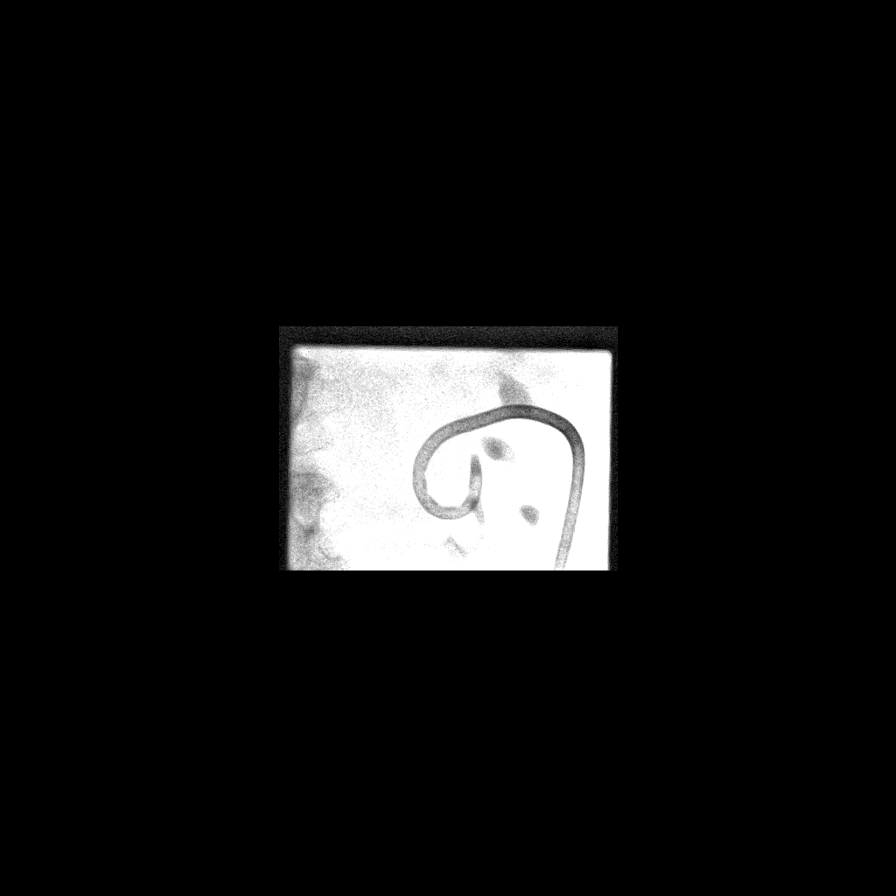
[im 6/6]
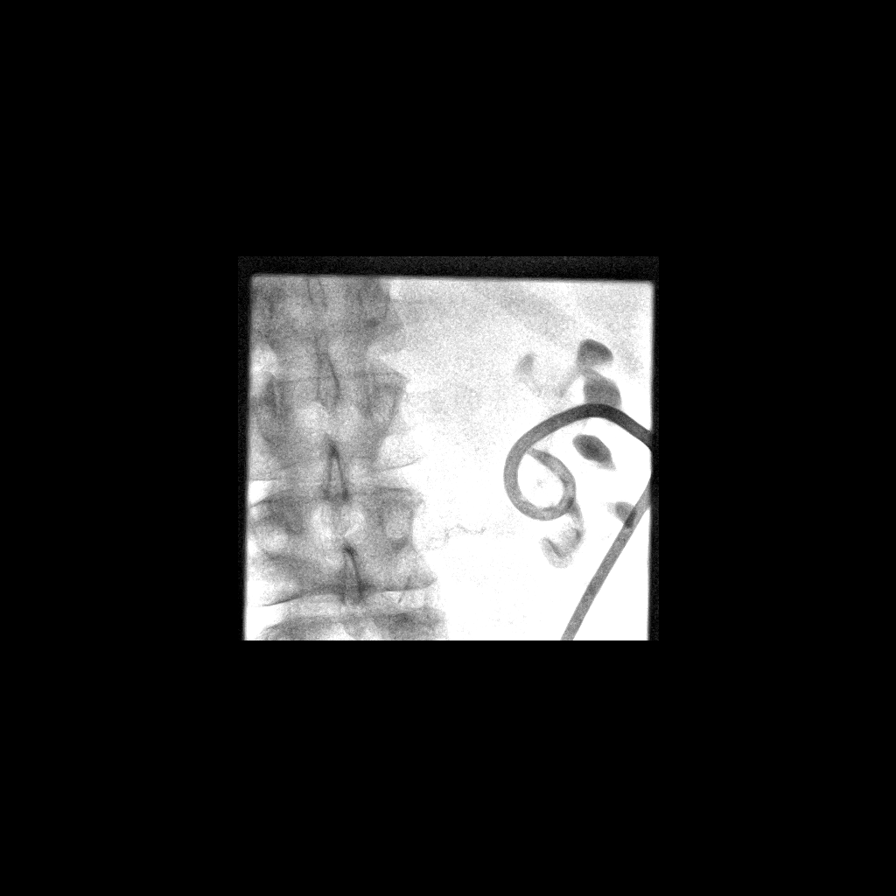

[6 of 6 positions shown; findings below may reference images not displayed]

MEDICATIONS:
None

ANESTHESIA/SEDATION:
None

CONTRAST:  10 mL Omnipaque 300-administered into the collecting
system(s)

FLUOROSCOPY TIME:  Fluoroscopy Time: 3 minutes 12 seconds (58.6
mGy).

COMPLICATIONS:
None immediate.

PROCEDURE:
Informed written consent was obtained from the patient after a
thorough discussion of the procedural risks, benefits and
alternatives. All questions were addressed. Maximal Sterile Barrier
Technique was utilized including caps, mask, sterile gowns, sterile
gloves, sterile drape, hand hygiene and skin antiseptic. A timeout
was performed prior to the initiation of the procedure.

Gentle hand injection of contrast material through the left
percutaneous nephrostomy tube demonstrates the tube enters via an
interpolar calyx and is well formed in the renal pelvis. The renal
pelvis is relatively small with respect to the tube. The tube was
cut and removed over a short Amplatz wire. [REDACTED] French
percutaneous nephrostomy tube was advanced over the wire and formed.
The tube was secured to the skin with 0 Prolene suture and an
adhesive fixation device. Images were obtained and stored for the
medical record.

Attention was turned to the left tube. A gentle hand injection of
contrast material was again performed. The tube enters via and upper
pole calyx. The tube is large relative to the size of the collecting
system. The tube was cut and removed over a short Amplatz wire. An
attempt was made to replace with another 12 percutaneous nephrostomy
tube, however the tube was difficult to form in the relatively small
bifid renal pelvis. The patient expressed discomfort. Therefore, the
tube was discarded and a 12 Reydumb Inthemix drainage catheter
was advanced over the wire and formed in the renal pelvis. This
catheter was then secured to the skin with 0 Prolene suture and an
adhesive fixation device.

The patient tolerated the procedure relatively well.
IMPRESSION: 1. Exchange for a left 12 French percutaneous nephrostomy tube.
2. Exchange for a right 12 Maria Carmina Jashari nephrostomy tube
due to small renal pelvis size and patient discomfort.

## 2018-11-20 IMAGING — CT CT ABD-PELV W/ CM
2 of 5 series · 15 of 46 positions shown, 17 images · IV contrast (Omni 300)
Comparison: 10/02/2017

CLINICAL DATA: Bladder pressure, fever and chills. Blood in the
Frost me back after tube placement. History of urinoma with right
ureteral stricture and pelvic abscess.

EXAM:
CT ABDOMEN AND PELVIS WITH CONTRAST
TECHNIQUE: Multidetector CT imaging of the abdomen and pelvis was performed
using the standard protocol following bolus administration of
intravenous contrast.
CONTRAST:  80mL OMNIPAQUE IOHEXOL 300 MG/ML  SOLN

[Series 3: a/p w/ 5mm · axial · 0.96mm/px · z∈[-444,-54]mm · 12 of 88 slices shown, 14 images]
[im 5/88  soft-tissue]
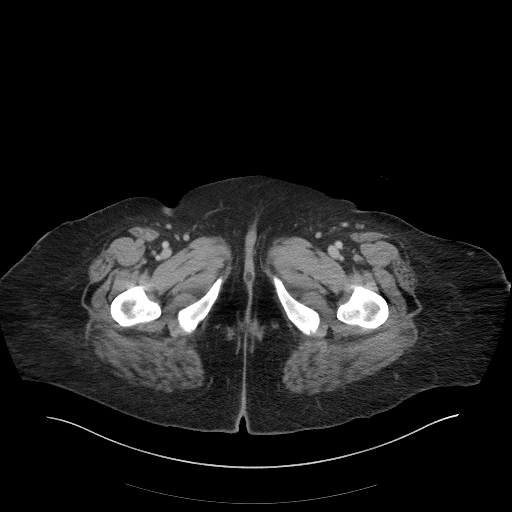
[im 5/88  bone]
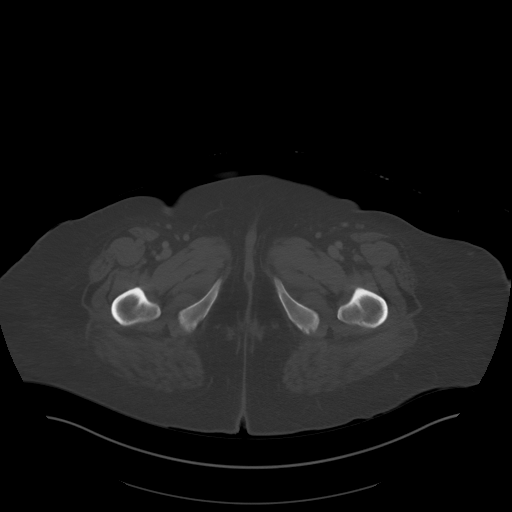
[im 14/88  soft-tissue]
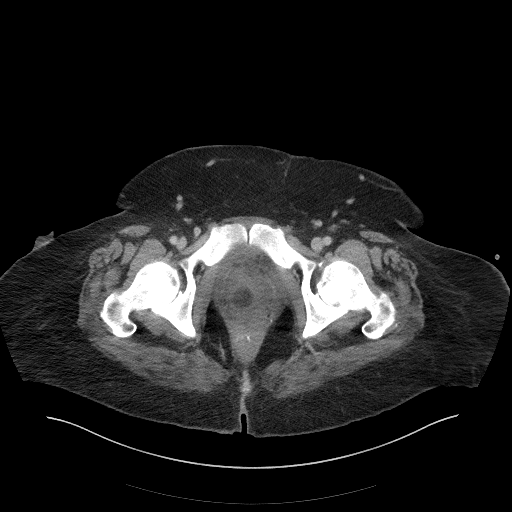
[im 19/88  soft-tissue]
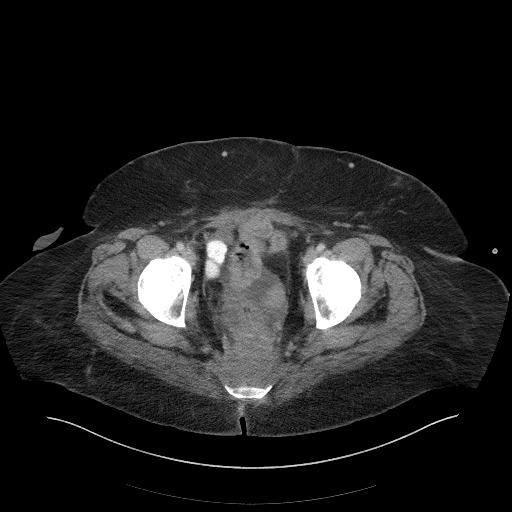
[im 28/88  soft-tissue]
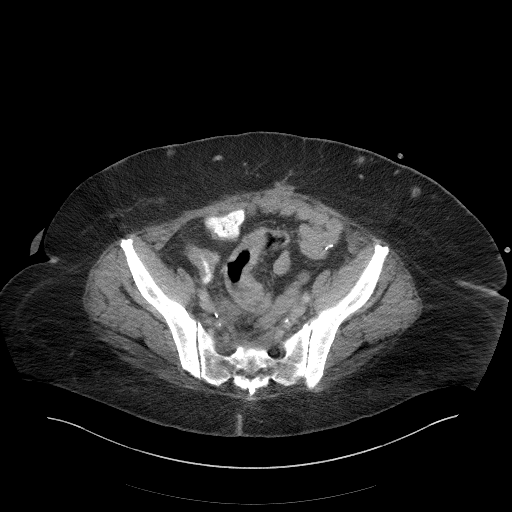
[im 33/88  soft-tissue]
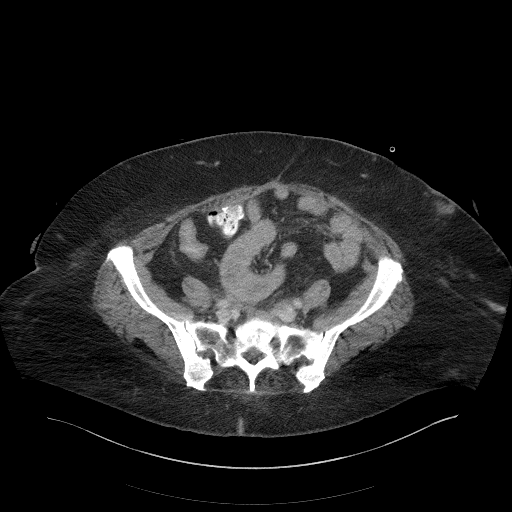
[im 42/88  soft-tissue]
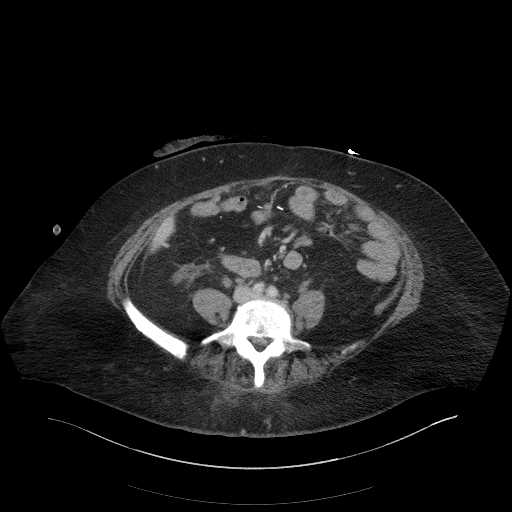
[im 46/88  soft-tissue]
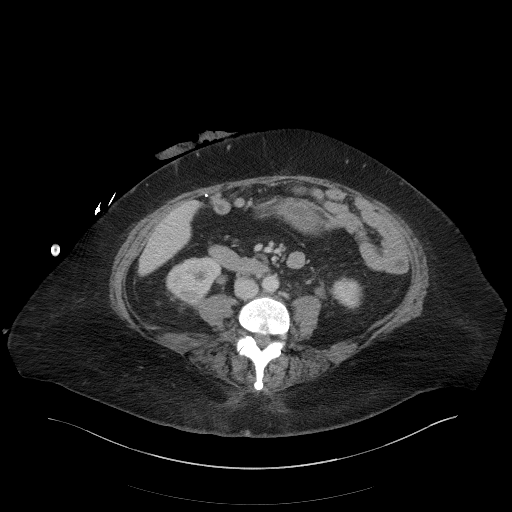
[im 55/88  soft-tissue]
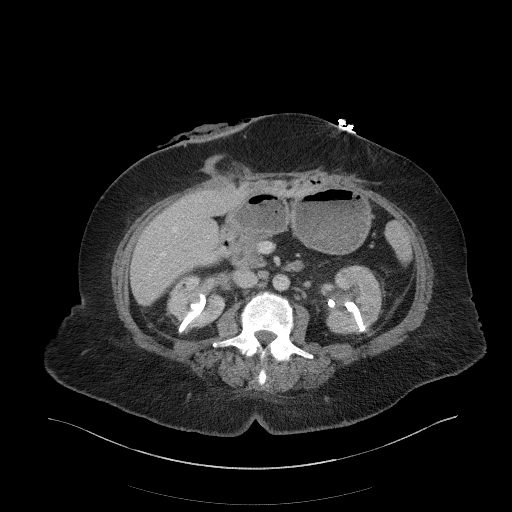
[im 60/88  soft-tissue]
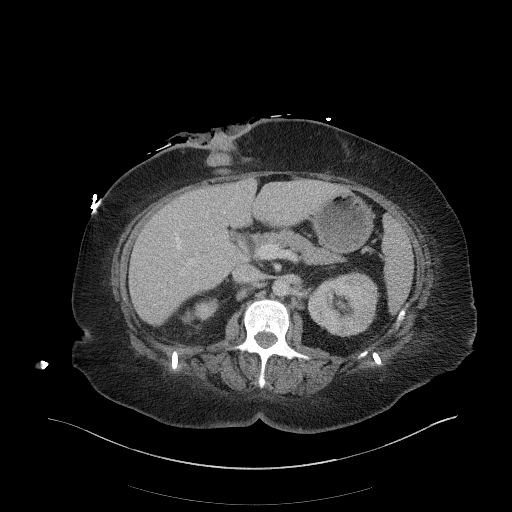
[im 60/88  bone]
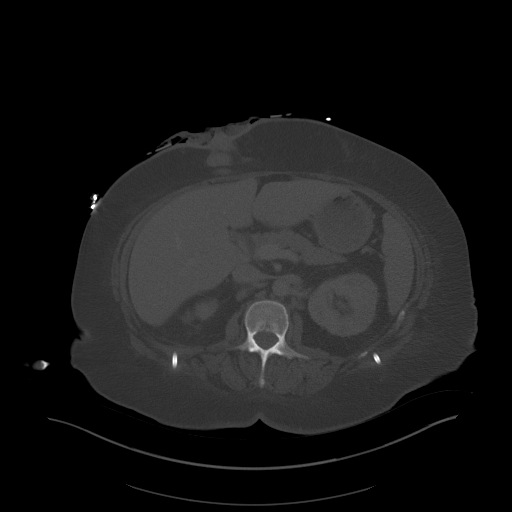
[im 69/88  soft-tissue]
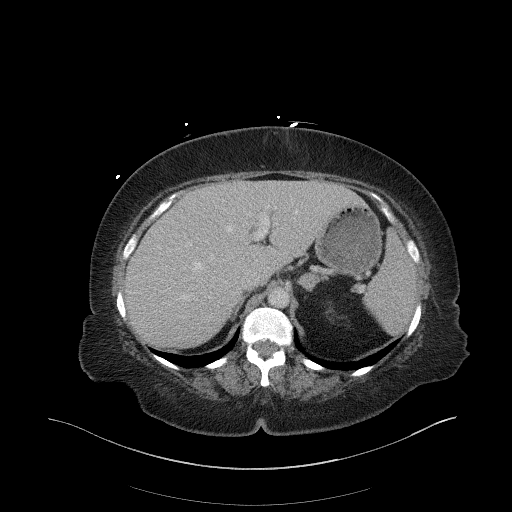
[im 74/88  soft-tissue]
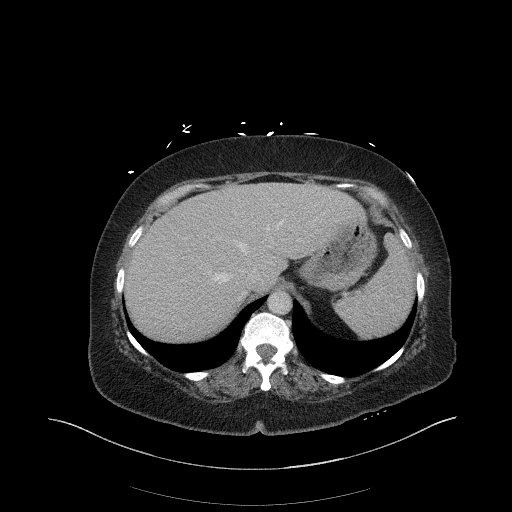
[im 83/88  soft-tissue]
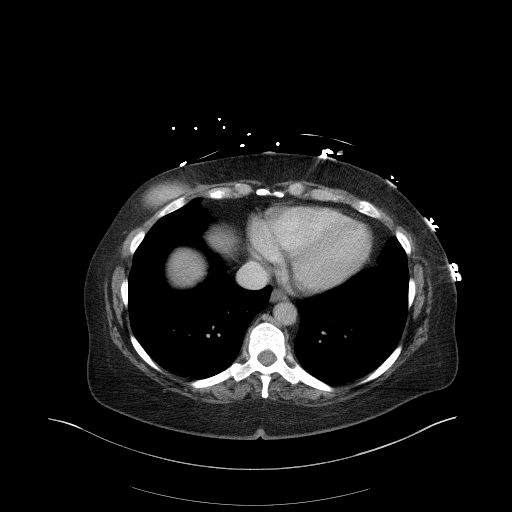

[Series 6: a/p w/ cor · coronal · 0.88mm/px · 3 of 141 slices shown]
[im 47/141  soft-tissue]
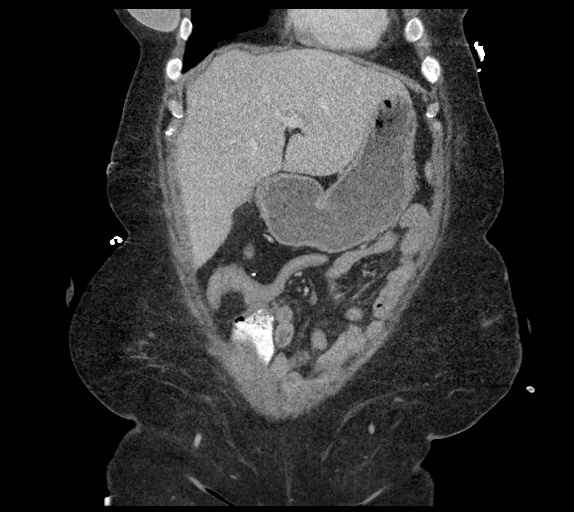
[im 63/141  soft-tissue]
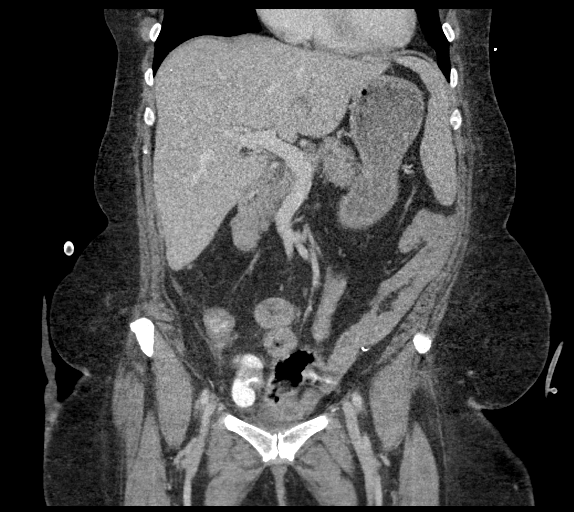
[im 78/141  soft-tissue]
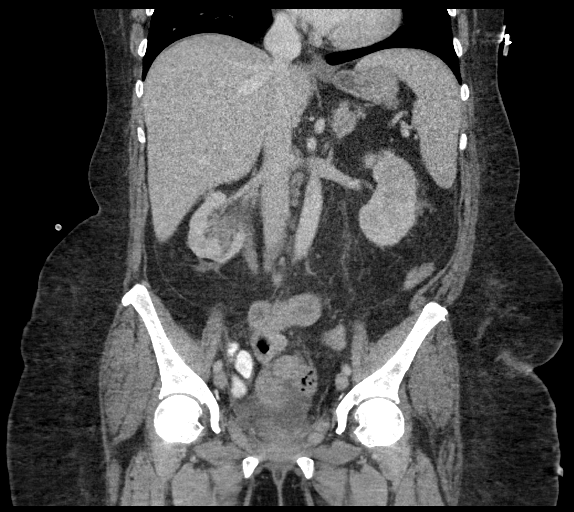

[15 of 46 positions shown; findings below may reference images not displayed]

FINDINGS: Lower chest: Bilateral breast implants.  Lung bases are clear.

Hepatobiliary: No focal liver abnormality is seen. Status post
cholecystectomy. No biliary dilatation.

Pancreas: Unremarkable. No pancreatic ductal dilatation or
surrounding inflammatory changes.

Spleen: Normal in size without focal abnormality.

Adrenals/Urinary Tract: Left adrenal gland nodule measuring 1.7 cm.
Indeterminate. No change since prior study. Bilateral percutaneous
nephrostomy tubes. Pigtails reside in the renal collecting systems
bilaterally. No hydronephrosis. Gas in the renal collecting systems
is likely related to catheter insertion. Hazy infiltration in the
renal hila bilaterally may represent edema, inflammatory change, or
hematoma. Bladder is decompressed with a Foley catheter.

Stomach/Bowel: Partial colectomy with right upper quadrant
colostomy. Stomach, small bowel, and colon are not abnormally
distended. A loop of bowel in the pelvis, probably small-bowel,
demonstrates thickened wall suggesting enteritis or bowel wall
edema. If there has been a history of pelvic radiation therapy this
could be postradiation change.

Vascular/Lymphatic: Normal caliber abdominal aorta. Scattered lymph
nodes in the retroperitoneum are prominent but not pathologically
enlarged. No change since prior study.

Reproductive: Surgical absence of the uterus. No abnormal pelvic
masses.

Other: No free air in the abdomen. Small amount of free fluid in the
pelvis is similar to prior study. There is prominent residual
presacral soft tissue with surgical clips present. This is similar
to prior study and may represent postoperative scarring. Follow up
on subsequent imaging is recommended to exclude development of
residual or recurrent tumor.

Musculoskeletal: No destructive bone lesions.
IMPRESSION: 1. Bilateral percutaneous nephrostomy tubes with decompression of
the renal collecting systems. Hazy infiltration in the renal hila
bilaterally could represent edema, inflammatory change, or hematoma.
2. Pelvic small bowel demonstrates thickened wall suggesting
enteritis or edema. Possible radiation change.
3. Small amount of free fluid in the pelvis is nonspecific but
likely reactive. No change since prior study.
4. Prominent presacral soft tissues and area of postoperative
change. This could represent postoperative scarring. Attention on
follow-up imaging to exclude residual or recurrent tumor. No change
since prior study.
5. 1.7 cm left adrenal gland nodule is unchanged since prior study.

## 2018-11-20 IMAGING — XA IR NEPHROSTOGRAM EXISTING ACCESS RIGHT
3 series · 9 of 9 positions shown · non-contrast
Comparison: Previous day's exam

INDICATION: Sepsis. Patient had bilateral nephrostomy catheter exchange
yesterday without apparent complication.

EXAM:
BILATERAL ANTEGRADE NEPHROSTOGRAM THROUGH EXISTING CATHETER

[Series 1: single · 1 of 1 slices shown]
[im 1/1]
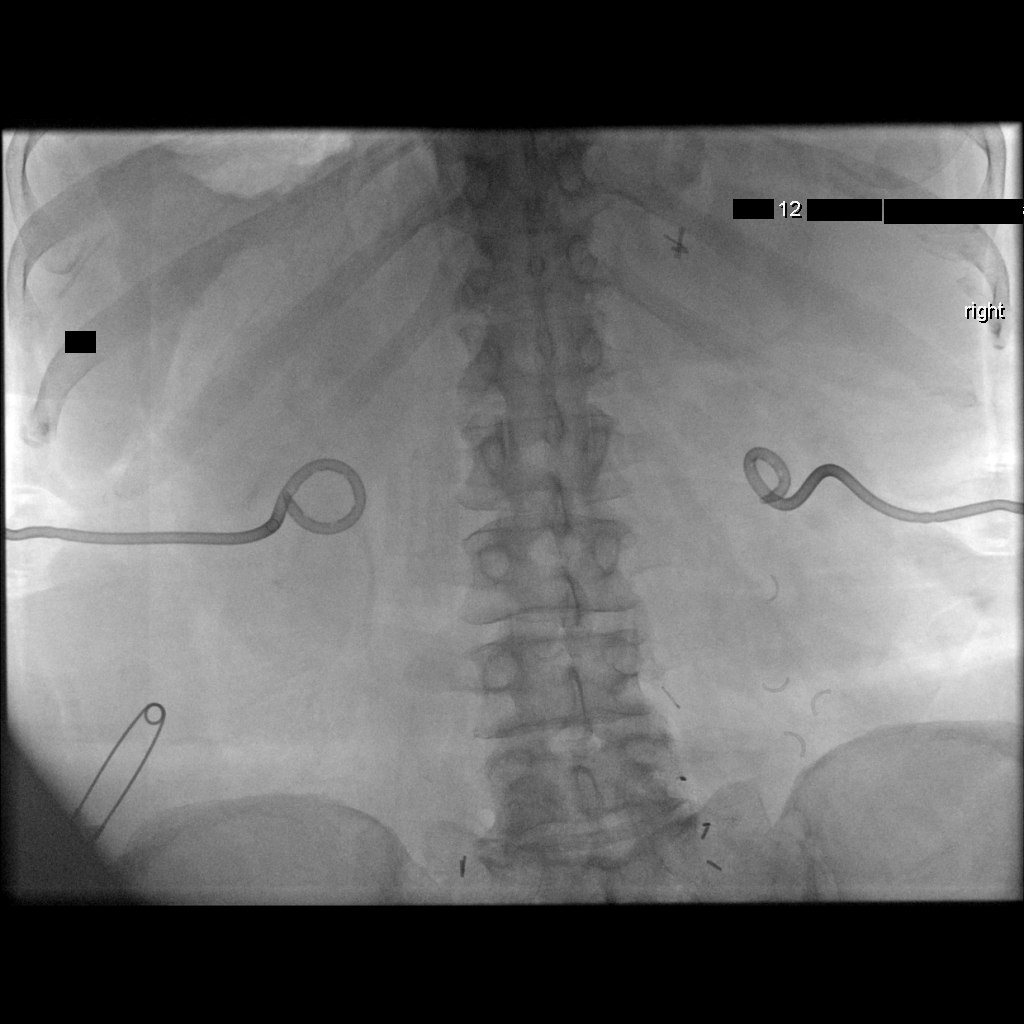

[Series 2: fl (-) angio · 4 of 57 frames shown (1 of 2)]
[frame 9/57]
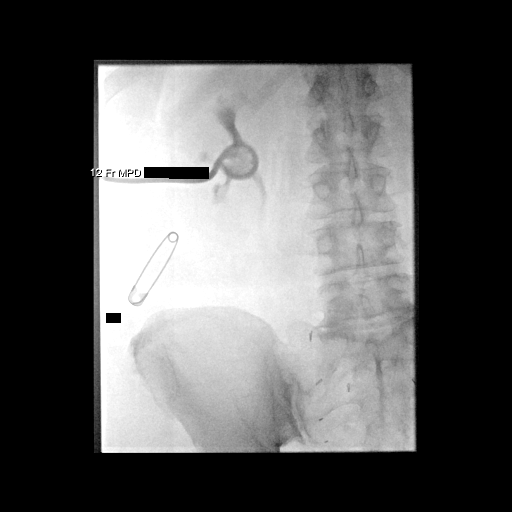
[frame 10/57]
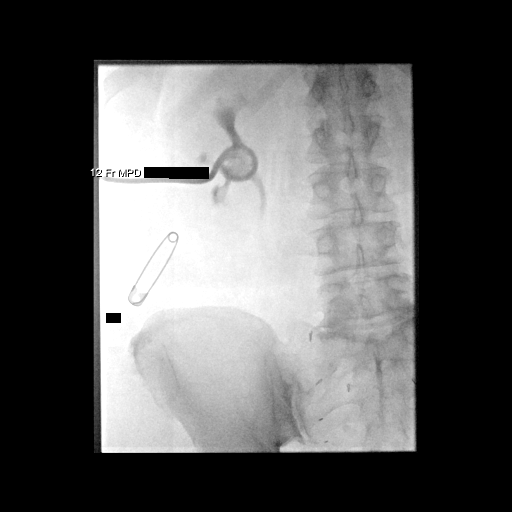
[frame 29/57]
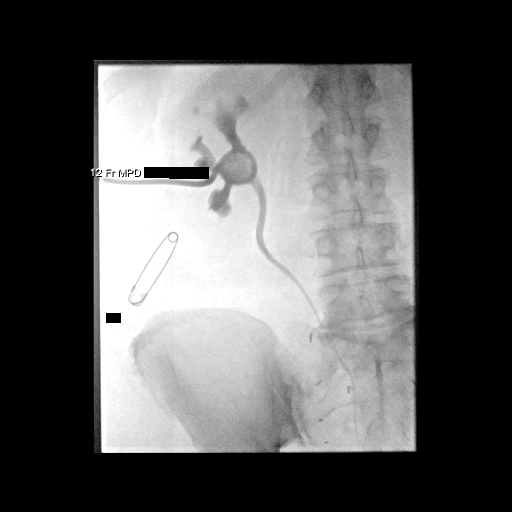
[frame 49/57]
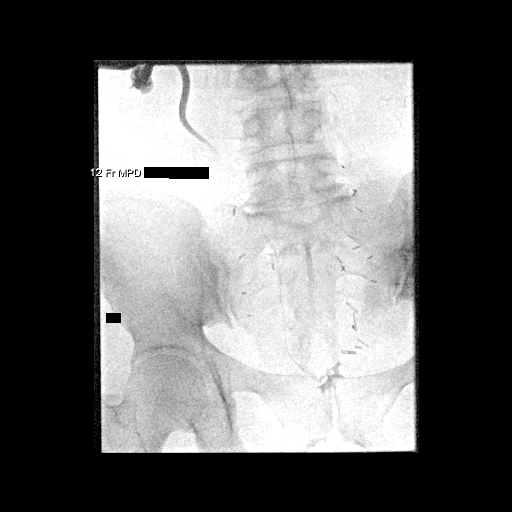

[Series 3: fl (-) angio · 4 of 95 frames shown (2 of 2)]
[frame 15/95]
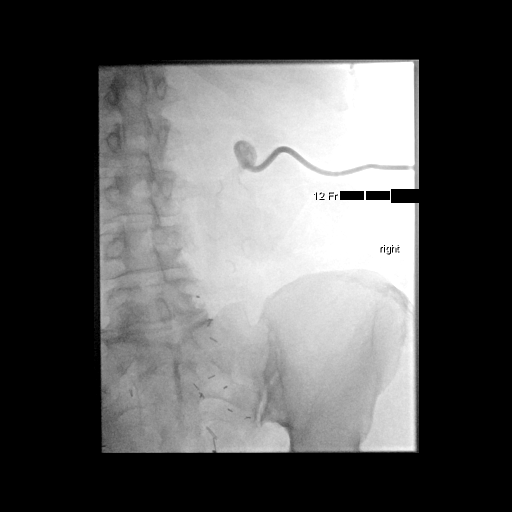
[frame 44/95]
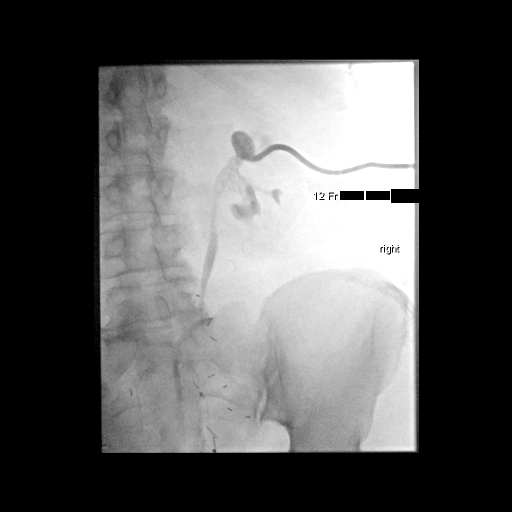
[frame 48/95]
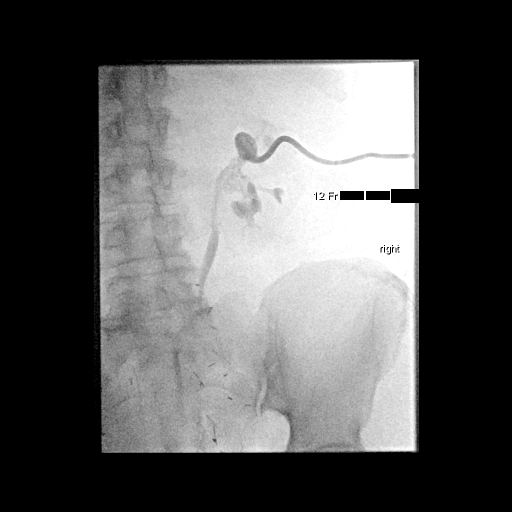
[frame 81/95]
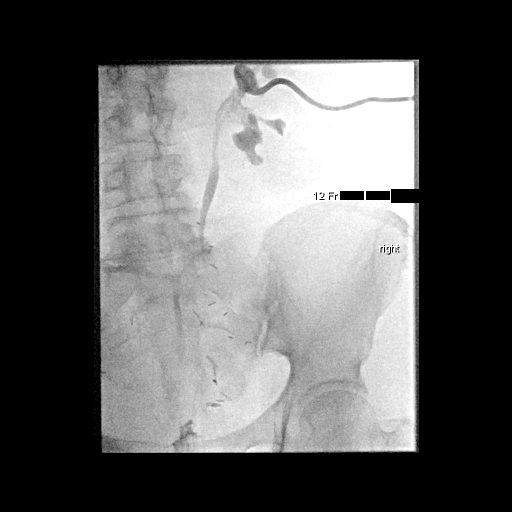

[9 of 9 positions shown; findings below may reference images not displayed]

MEDICATIONS:
None indicated

ANESTHESIA/SEDATION:
None required

CONTRAST:  15 mL Isovue-administered into the collecting system(s)

FLUOROSCOPY TIME:  36 seconds; 5 mGy

COMPLICATIONS:
None immediate.

PROCEDURE:
Informed written consent was obtained from the patient after a
thorough discussion of the procedural risks, benefits and
alternatives. All questions were addressed.

Contrast was sequentially injected under fluoroscopy through the
right and left nephrostomy catheters.

Both catheters remain well positioned centrally within the renal
collecting systems. No hydronephrosis. No filling defects. Proximal
ureters decompressed.
IMPRESSION: 1. Bilateral nephrostomy catheters are well position and patent.

## 2018-11-20 IMAGING — DX DG CHEST 1V PORT
1 series · 1 of 1 positions shown · non-contrast
Comparison: 10/03/2017

CLINICAL DATA: Fever

EXAM:
PORTABLE CHEST 1 VIEW

[chest ap]
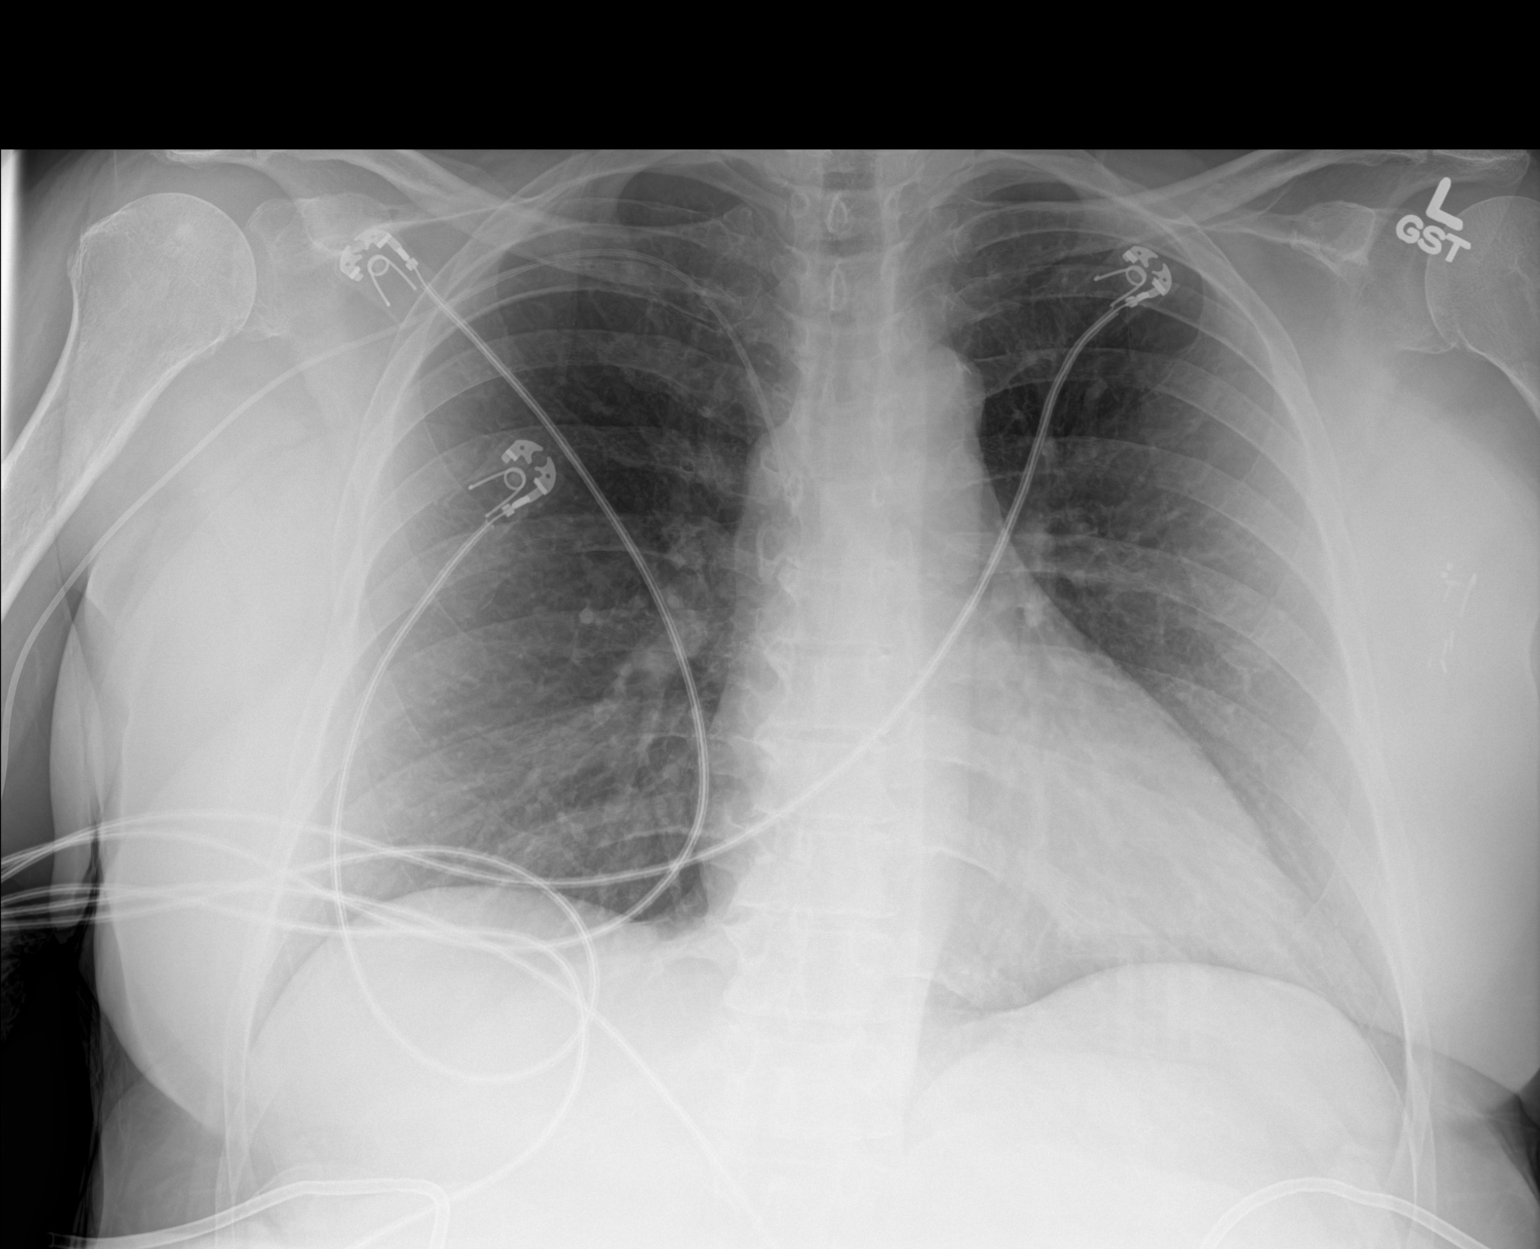

[1 of 1 positions shown; findings below may reference images not displayed]

FINDINGS: The heart size and mediastinal contours are within normal limits.
Both lungs are clear. The visualized skeletal structures are
unremarkable. Right PICC line with tip over the mid SVC region. No
pneumothorax. Surgical clips in the left axilla. Probable drainage
catheters projected over the upper abdomen, incompletely visualized.
IMPRESSION: No active disease.

## 2018-11-21 ENCOUNTER — Encounter (HOSPITAL_COMMUNITY): Payer: Self-pay | Admitting: Interventional Radiology

## 2018-11-21 ENCOUNTER — Ambulatory Visit (INDEPENDENT_AMBULATORY_CARE_PROVIDER_SITE_OTHER): Payer: BLUE CROSS/BLUE SHIELD | Admitting: Family Medicine

## 2018-11-21 ENCOUNTER — Other Ambulatory Visit: Payer: Self-pay

## 2018-11-21 ENCOUNTER — Telehealth: Payer: Self-pay | Admitting: Behavioral Health

## 2018-11-21 ENCOUNTER — Other Ambulatory Visit (HOSPITAL_COMMUNITY): Payer: Self-pay | Admitting: Radiology

## 2018-11-21 ENCOUNTER — Ambulatory Visit (HOSPITAL_COMMUNITY)
Admission: RE | Admit: 2018-11-21 | Discharge: 2018-11-21 | Disposition: A | Discharge status: Home / Self Care | Payer: BLUE CROSS/BLUE SHIELD | Source: Ambulatory Visit | Attending: Radiology | Admitting: Radiology

## 2018-11-21 DIAGNOSIS — Z436 Encounter for attention to other artificial openings of urinary tract: Secondary | ICD-10-CM | POA: Insufficient documentation

## 2018-11-21 DIAGNOSIS — C55 Malignant neoplasm of uterus, part unspecified: Secondary | ICD-10-CM

## 2018-11-21 DIAGNOSIS — E1122 Type 2 diabetes mellitus with diabetic chronic kidney disease: Secondary | ICD-10-CM

## 2018-11-21 DIAGNOSIS — T83092A Other mechanical complication of nephrostomy catheter, initial encounter: Secondary | ICD-10-CM | POA: Diagnosis not present

## 2018-11-21 DIAGNOSIS — N183 Chronic kidney disease, stage 3 unspecified: Secondary | ICD-10-CM

## 2018-11-21 DIAGNOSIS — N179 Acute kidney failure, unspecified: Secondary | ICD-10-CM

## 2018-11-21 DIAGNOSIS — C541 Malignant neoplasm of endometrium: Secondary | ICD-10-CM

## 2018-11-21 DIAGNOSIS — Y849 Medical procedure, unspecified as the cause of abnormal reaction of the patient, or of later complication, without mention of misadventure at the time of the procedure: Secondary | ICD-10-CM | POA: Insufficient documentation

## 2018-11-21 DIAGNOSIS — F4323 Adjustment disorder with mixed anxiety and depressed mood: Secondary | ICD-10-CM

## 2018-11-21 DIAGNOSIS — T83091A Other mechanical complication of indwelling urethral catheter, initial encounter: Secondary | ICD-10-CM | POA: Diagnosis not present

## 2018-11-21 DIAGNOSIS — Z8543 Personal history of malignant neoplasm of ovary: Secondary | ICD-10-CM

## 2018-11-21 HISTORY — PX: IR NEPHROSTOMY EXCHANGE RIGHT: IMG6070

## 2018-11-21 HISTORY — PX: IR NEPHROSTOMY EXCHANGE LEFT: IMG6069

## 2018-11-21 MED ORDER — SODIUM CHLORIDE 0.9% FLUSH: 5.0000 mL | Freq: Three times a day (TID) | INTRAVENOUS | Status: DC

## 2018-11-21 MED ORDER — IOHEXOL 300 MG/ML  SOLN
100.0000 mL | Freq: Once | INTRAMUSCULAR | Status: AC | PRN
  Administered 2018-11-21: 25 mL

## 2018-11-21 MED ORDER — FENTANYL 50 MCG/HR TD PT72: 1.0000 | MEDICATED_PATCH | TRANSDERMAL | 0 refills | Status: DC

## 2018-11-21 NOTE — Telephone Encounter (Signed)
I can do an E visit with her tomorrow at 1:45 PM.  Thanks.

## 2018-11-21 NOTE — Assessment & Plan Note (Signed)
Lab Results  Component Value Date   HGBA1C 7.9 (H) 09/01/2017

## 2018-11-21 NOTE — Progress Notes (Signed)
Virtual Visit via Video Note  I connected with [name] on 11/21/18 at  1:15 PM EDT by a video enabled telemedicine application and verified that I am speaking with the correct person using two identifiers.   I discussed the limitations of evaluation and management by telemedicine and the availability of in person appointments. The patient expressed understanding and agreed to proceed.  History of Present Illness: Pt is home needing refill of pain meds to  She has an appointment with interventional radiology to replace her nephrostomy tubes today.  She has had on and off fevers .  She is not currently on abx   Observations/Objective: 189.2 wt     120/70   Afebrile today---- but has had 99.  -- 100.2 over the last few days  Pt is in NAD No sob , no cp   Assessment and Plan: 1. Malignant neoplasm of uterus, unspecified site ([place]) Refill meds , database reviewed Will get uds with home health - fentaNYL (DURAGESIC) 50 MCG/HR; Place 1 patch onto the skin every 3 (three) days.  Dispense: 10 patch; Refill: 0  2. Acute renal failure superimposed on stage 3 chronic kidney disease, unspecified acute renal failure type [facility] Per nephrology   3. Adjustment disorder with mixed anxiety and depressed mood Stable-- con't meds   4. Type 2 diabetes mellitus with stage 3 chronic kidney disease, without long-term current use of insulin (HCC) Per endo   5. History of ovarian & endometrial cancer Per oncology / gyn  Follow Up Instructions:    I discussed the assessment and treatment plan with the patient. The patient was provided an opportunity to ask questions and all were answered. The patient agreed with the plan and demonstrated an understanding of the instructions.   The patient was advised to call back or seek an in-person evaluation if the symptoms worsen or if the condition fails to improve as anticipated.  I provided 30 minutes of non-face-to-face time during this  encounter.   Ann Held, DO

## 2018-11-21 NOTE — Procedures (Signed)
Interventional Radiology Procedure Note  Procedure:  Exchange of bilateral neph tube with 33F dawson Mueller.  .  Complications: None  Recommendations:  - DC home now - Do not submerge - Routine care   Signed,  Dulcy Fanny. Earleen Newport, DO

## 2018-11-21 NOTE — Assessment & Plan Note (Signed)
Per nephrology   Chemistry      Component Value Date/Time   NA 136 10/04/2018 1345   NA 141 01/24/2017 1228   K 3.6 10/04/2018 1345   K 4.6 01/24/2017 1228   CL 106 10/04/2018 1345   CO2 20 (L) 10/04/2018 1345   CO2 29 01/24/2017 1228   BUN 36 (H) 10/04/2018 1345   BUN 22.2 01/24/2017 1228   CREATININE 1.94 (H) 10/04/2018 1345   CREATININE 1.98 (H) 03/27/2018 1324   CREATININE 0.8 01/24/2017 1228      Component Value Date/Time   CALCIUM 9.3 10/04/2018 1345   CALCIUM 10.2 01/24/2017 1228   ALKPHOS 550 (H) 10/04/2018 1345   ALKPHOS 90 01/24/2017 1228   AST 28 10/04/2018 1345   AST 14 (L) 03/27/2018 1324   AST 16 01/24/2017 1228   ALT 55 (H) 10/04/2018 1345   ALT 18 03/27/2018 1324   ALT 14 01/24/2017 1228   BILITOT 0.5 10/04/2018 1345   BILITOT 0.2 (L) 03/27/2018 1324   BILITOT 0.34 01/24/2017 1228

## 2018-11-21 NOTE — Assessment & Plan Note (Signed)
Per oncology °

## 2018-11-21 NOTE — Telephone Encounter (Signed)
Patient called wanting Dr. Megan Salon to made aware that over this past week she has experienced fevers on 100.1-100.1 on and off.  She states she has not had to take acetaminophen they have been going away on their own.  She states she is having a significant amount of left flank pain that started at 4:00am this morning.  She rates it 10/10 but after taking hydrocodone it is now 4/10.  She states she has an appointment with IR today because her left nephrostomy tube has absolutely no output but getting red around the insertion site however the Right nephrostomy tube is putting out double the amount she is usstates she also has a foley catheter and states she felt 2 large gushes today and her depends was full of urine also scant blood.    She states states she had an EVISIT today with her PCP Dr. Etter Sjogren who suggested Dr. Megan Salon be made aware.  PCP ordered Urine culture and sensitivity that will be completed by her home health nurse tomorrow morning at 9:30.  She states if Dr. Megan Salon needs to add any orders to contact the home health nurse. Pricilla Riffle RN

## 2018-11-21 NOTE — Assessment & Plan Note (Signed)
wellbutrin recently started con't meds

## 2018-11-22 ENCOUNTER — Ambulatory Visit (INDEPENDENT_AMBULATORY_CARE_PROVIDER_SITE_OTHER): Payer: BLUE CROSS/BLUE SHIELD | Admitting: Internal Medicine

## 2018-11-22 ENCOUNTER — Encounter: Payer: Self-pay | Admitting: Internal Medicine

## 2018-11-22 ENCOUNTER — Telehealth: Payer: Self-pay

## 2018-11-22 DIAGNOSIS — E1122 Type 2 diabetes mellitus with diabetic chronic kidney disease: Secondary | ICD-10-CM | POA: Diagnosis not present

## 2018-11-22 DIAGNOSIS — Z452 Encounter for adjustment and management of vascular access device: Secondary | ICD-10-CM | POA: Diagnosis not present

## 2018-11-22 DIAGNOSIS — T82594D Other mechanical complication of infusion catheter, subsequent encounter: Secondary | ICD-10-CM | POA: Diagnosis not present

## 2018-11-22 DIAGNOSIS — D631 Anemia in chronic kidney disease: Secondary | ICD-10-CM | POA: Diagnosis not present

## 2018-11-22 DIAGNOSIS — E860 Dehydration: Secondary | ICD-10-CM | POA: Diagnosis not present

## 2018-11-22 DIAGNOSIS — T83592A Infection and inflammatory reaction due to indwelling ureteral stent, initial encounter: Secondary | ICD-10-CM | POA: Diagnosis not present

## 2018-11-22 DIAGNOSIS — L89309 Pressure ulcer of unspecified buttock, unspecified stage: Secondary | ICD-10-CM | POA: Diagnosis not present

## 2018-11-22 DIAGNOSIS — D709 Neutropenia, unspecified: Secondary | ICD-10-CM | POA: Diagnosis not present

## 2018-11-22 DIAGNOSIS — N179 Acute kidney failure, unspecified: Secondary | ICD-10-CM | POA: Diagnosis not present

## 2018-11-22 DIAGNOSIS — C541 Malignant neoplasm of endometrium: Secondary | ICD-10-CM | POA: Diagnosis not present

## 2018-11-22 DIAGNOSIS — N390 Urinary tract infection, site not specified: Secondary | ICD-10-CM | POA: Diagnosis not present

## 2018-11-22 DIAGNOSIS — E785 Hyperlipidemia, unspecified: Secondary | ICD-10-CM | POA: Diagnosis not present

## 2018-11-22 DIAGNOSIS — N182 Chronic kidney disease, stage 2 (mild): Secondary | ICD-10-CM | POA: Diagnosis not present

## 2018-11-22 DIAGNOSIS — L89153 Pressure ulcer of sacral region, stage 3: Secondary | ICD-10-CM | POA: Diagnosis not present

## 2018-11-22 DIAGNOSIS — B9562 Methicillin resistant Staphylococcus aureus infection as the cause of diseases classified elsewhere: Secondary | ICD-10-CM | POA: Diagnosis not present

## 2018-11-22 DIAGNOSIS — L89311 Pressure ulcer of right buttock, stage 1: Secondary | ICD-10-CM | POA: Diagnosis not present

## 2018-11-22 DIAGNOSIS — L89312 Pressure ulcer of right buttock, stage 2: Secondary | ICD-10-CM | POA: Diagnosis not present

## 2018-11-22 LAB — BASIC METABOLIC PANEL: Glucose: 224

## 2018-11-22 NOTE — Progress Notes (Signed)
Virtual Visit via Telephone Note  I connected with Ifeoluwa Bartz Johal on 11/22/18 at  1:45 PM EDT by telephone and verified that I am speaking with the correct person using two identifiers.   I discussed the limitations, risks, security and privacy concerns of performing an evaluation and management service by telephone and the availability of in person appointments. I also discussed with the patient that there may be a patient responsible charge related to this service. The patient expressed understanding and agreed to proceed.   History of Present Illness: I called and spoke with Ms. Bukowski today.  She has a very complicated history. 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 She underwent TAH-BSO followed by chemotherapy. She developed a pelvic recurrence in 2016 and underwent partial colectomy and colostomy formation followed by radiation therapy. 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 On 04/01/2017 she underwent extensive surgery with lysis of adhesions, colostomy takedown, diverting loop ileostomy formation, reimplantation of her right ureter in the dome of her bladder and stent replacement.  She developed postoperative pelvic abscesses that required percutaneous drainage and a long course of antibiotics to cure.  She also has a history of breast cancer diagnosed last year. She is on Arimidex.  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  Attempts have been made to manage this with a Foley catheter and bilateral percutaneous nephrostomy tubes.  She has had problems with recurrent urinary tract infections and reports that her quality of life has been very poor.  She was evaluated at [facility] last July and a decision was made to proceed with pelvic exenteration, a very complex procedure requiring urology, colorectal surgery and plastic and reconstructive  surgery involvement.  Unfortunately, that procedure has never been scheduled and now that the COVID-19 pandemic is upon [place] it is not a current option for her.  Over the past several months she has had problems with bladder spasms and increased drainage which she believes is coming from around her Foley catheter rather than from her rectum.  She describes the drainage as having a foul odor and looking like old blood with mucus.  She is also been having intermittent fevers.  Multiple urine cultures have grown Serratia marcescens.  Blood cultures have been negative.  She has multiple antibiotic allergies limiting her treatment options.  1 month ago I started her on IV ceftriaxone.  She has had slow improvement.  She completed ceftriaxone therapy on 11/04/2018.    Over the past several days she has developed some low-grade fever up to 100 degrees and some increased left flank pain.  She noted decreased output from her left nephrostomy tube.  She underwent replacement of her nephrostomy tubes yesterday and it was discovered that her left tube had become dislodged and was no longer in the renal pelvis.  She was afebrile this morning and feeling a little bit better but is not sure if that is because she is actually better or if her acetaminophen-containing pain medication is masking symptoms.  Her nurse did collect a urine from her nephrostomy tubes and Foley catheter for urine culture this morning.  She has continued to have some intermittent blood coming from around her Foley catheter.   Observations/Objective:   Assessment and Plan: This remains an extremely difficult situation.  I discussed management options with her.  We are in agreement with close observation off  of antibiotics for now.  I will check on culture results and call her tomorrow.  Follow Up Instructions: Observe off of antibiotics I will call her tomorrow   I discussed the assessment and treatment plan with the patient. The patient was  provided an opportunity to ask questions and all were answered. The patient agreed with the plan and demonstrated an understanding of the instructions.   The patient was advised to call back or seek an in-person evaluation if the symptoms worsen or if the condition fails to improve as anticipated.  I provided 18 minutes of non-face-to-face time during this encounter.   Michel Bickers, MD

## 2018-11-22 NOTE — Telephone Encounter (Signed)
Copied from [place] [id number]. Topic: General - Other >> Nov 22, 2018  8:34 AM Burchel, Abbi R wrote: Edd Fabian [facility] ) called to ask if she needed to collect u/a for pt today during home visit. Please advise.   [phone]

## 2018-11-22 NOTE — Telephone Encounter (Signed)
Do you want them to do a ua dip and culture?

## 2018-11-23 ENCOUNTER — Telehealth: Payer: Self-pay | Admitting: Internal Medicine

## 2018-11-23 NOTE — Telephone Encounter (Signed)
I spoke to [name] this morning.  She is feeling about the same.  She had a temperature of 100.8 degrees last night but he is afebrile this morning.  She continues to have some left flank discomfort and some pressure sensation in her suprapubic region.  Her urinalysis showed 11-30 white blood cells.  Her urine culture is still pending.  She prefers to wait on final culture results before making a decision about restarting antibiotics.  I will call her tomorrow.

## 2018-11-24 ENCOUNTER — Telehealth: Payer: Self-pay | Admitting: Internal Medicine

## 2018-11-24 ENCOUNTER — Encounter: Payer: Self-pay | Admitting: Behavioral Health

## 2018-11-24 ENCOUNTER — Other Ambulatory Visit: Payer: Self-pay | Admitting: Internal Medicine

## 2018-11-24 DIAGNOSIS — E860 Dehydration: Secondary | ICD-10-CM | POA: Diagnosis not present

## 2018-11-24 DIAGNOSIS — L89309 Pressure ulcer of unspecified buttock, unspecified stage: Secondary | ICD-10-CM | POA: Diagnosis not present

## 2018-11-24 DIAGNOSIS — N3001 Acute cystitis with hematuria: Secondary | ICD-10-CM

## 2018-11-24 DIAGNOSIS — E785 Hyperlipidemia, unspecified: Secondary | ICD-10-CM | POA: Diagnosis not present

## 2018-11-24 MED ORDER — CEFTRIAXONE SODIUM 1 G IJ SOLR: 1.0000 g | Freq: Once | INTRAMUSCULAR | 0 refills | Status: AC

## 2018-11-24 NOTE — Telephone Encounter (Signed)
I spoke to [name] again this morning.  She is feeling a little bit worse.  Her temperature was 100.8 degrees last night.  She is having more suprapubic pressure and left flank aching.  Unfortunately her urine culture is still pending at [place].  I will restart ceftriaxone and follow-up final cultures on Monday, 11/27/2018.

## 2018-11-24 NOTE — Telephone Encounter (Signed)
Called [facility], Spoke with [place].  Informed her that Dr. Megan Salon has ordered Ceftriaxone 1 gram IV daily X 14 days.  No additional labs, and patient already has a PICC line and PICC care is already in place.  [name] Verbalized understanding and orders faxed to [facility].    Called Mrs. Crafton and informed her that orders were sent to [facility] and they would be contacting her about delivery of medication.  Patient verbalized understanding  Pricilla Riffle RN

## 2018-11-24 NOTE — Telephone Encounter (Signed)
error 

## 2018-11-27 ENCOUNTER — Telehealth: Payer: Self-pay | Admitting: Internal Medicine

## 2018-11-27 ENCOUNTER — Telehealth: Payer: Self-pay | Admitting: *Deleted

## 2018-11-27 ENCOUNTER — Other Ambulatory Visit: Payer: Self-pay | Admitting: Internal Medicine

## 2018-11-27 DIAGNOSIS — E1122 Type 2 diabetes mellitus with diabetic chronic kidney disease: Secondary | ICD-10-CM | POA: Diagnosis not present

## 2018-11-27 DIAGNOSIS — T82594D Other mechanical complication of infusion catheter, subsequent encounter: Secondary | ICD-10-CM | POA: Diagnosis not present

## 2018-11-27 DIAGNOSIS — D631 Anemia in chronic kidney disease: Secondary | ICD-10-CM | POA: Diagnosis not present

## 2018-11-27 DIAGNOSIS — L89153 Pressure ulcer of sacral region, stage 3: Secondary | ICD-10-CM | POA: Diagnosis not present

## 2018-11-27 DIAGNOSIS — N3000 Acute cystitis without hematuria: Secondary | ICD-10-CM

## 2018-11-27 DIAGNOSIS — E860 Dehydration: Secondary | ICD-10-CM | POA: Diagnosis not present

## 2018-11-27 DIAGNOSIS — D709 Neutropenia, unspecified: Secondary | ICD-10-CM | POA: Diagnosis not present

## 2018-11-27 DIAGNOSIS — N182 Chronic kidney disease, stage 2 (mild): Secondary | ICD-10-CM | POA: Diagnosis not present

## 2018-11-27 DIAGNOSIS — L89312 Pressure ulcer of right buttock, stage 2: Secondary | ICD-10-CM | POA: Diagnosis not present

## 2018-11-27 DIAGNOSIS — N179 Acute kidney failure, unspecified: Secondary | ICD-10-CM | POA: Diagnosis not present

## 2018-11-27 DIAGNOSIS — L89311 Pressure ulcer of right buttock, stage 1: Secondary | ICD-10-CM | POA: Diagnosis not present

## 2018-11-27 DIAGNOSIS — B9562 Methicillin resistant Staphylococcus aureus infection as the cause of diseases classified elsewhere: Secondary | ICD-10-CM | POA: Diagnosis not present

## 2018-11-27 DIAGNOSIS — Z452 Encounter for adjustment and management of vascular access device: Secondary | ICD-10-CM | POA: Diagnosis not present

## 2018-11-27 DIAGNOSIS — T83592A Infection and inflammatory reaction due to indwelling ureteral stent, initial encounter: Secondary | ICD-10-CM | POA: Diagnosis not present

## 2018-11-27 DIAGNOSIS — C541 Malignant neoplasm of endometrium: Secondary | ICD-10-CM | POA: Diagnosis not present

## 2018-11-27 MED ORDER — SODIUM CHLORIDE 0.9 % IV SOLN: 1.0000 g | Freq: Two times a day (BID) | INTRAVENOUS | Status: AC

## 2018-11-27 NOTE — Telephone Encounter (Signed)
I called [name] today.  She is still having low-grade fever, some discharge around her Foley catheter and worsening suprapubic and rectal pressure sensations.  Her recent urine culture grew Acinetobacter resistant to ceftriaxone but sensitive to Carbapenem antibiotics.  I will change ceftriaxone to meropenem 1 g every 12 hours for 10 days.

## 2018-11-27 NOTE — Telephone Encounter (Signed)
Called [facility], Informed [name] that Dr. Megan Salon would be discontinuing Ceftriaxone and starting Ms. Tomko on Meropenem 1 gm IV Q12 hours X10 days, Weekly CBC and CMP.  [name]  verbalized understanding with read back.  Informed her unsure if patient has had this medication before. [name] will send out a anaphalaxis kit and homenurse will be notified to stay with patient to administer first dose. Pricilla Riffle RN

## 2018-11-27 NOTE — Telephone Encounter (Signed)
Received Lab Report results from [facility] via [facility]; forwarded to provider/SLS  04/27

## 2018-11-28 DIAGNOSIS — Z933 Colostomy status: Secondary | ICD-10-CM | POA: Diagnosis not present

## 2018-11-28 DIAGNOSIS — E860 Dehydration: Secondary | ICD-10-CM | POA: Diagnosis not present

## 2018-11-28 DIAGNOSIS — L89319 Pressure ulcer of right buttock, unspecified stage: Secondary | ICD-10-CM | POA: Diagnosis not present

## 2018-11-28 DIAGNOSIS — L89309 Pressure ulcer of unspecified buttock, unspecified stage: Secondary | ICD-10-CM | POA: Diagnosis not present

## 2018-11-28 DIAGNOSIS — E785 Hyperlipidemia, unspecified: Secondary | ICD-10-CM | POA: Diagnosis not present

## 2018-11-29 DIAGNOSIS — L89153 Pressure ulcer of sacral region, stage 3: Secondary | ICD-10-CM | POA: Diagnosis not present

## 2018-11-29 DIAGNOSIS — L89311 Pressure ulcer of right buttock, stage 1: Secondary | ICD-10-CM | POA: Diagnosis not present

## 2018-11-29 DIAGNOSIS — E860 Dehydration: Secondary | ICD-10-CM | POA: Diagnosis not present

## 2018-11-29 DIAGNOSIS — N179 Acute kidney failure, unspecified: Secondary | ICD-10-CM | POA: Diagnosis not present

## 2018-11-29 DIAGNOSIS — T82594D Other mechanical complication of infusion catheter, subsequent encounter: Secondary | ICD-10-CM | POA: Diagnosis not present

## 2018-11-29 DIAGNOSIS — D709 Neutropenia, unspecified: Secondary | ICD-10-CM | POA: Diagnosis not present

## 2018-11-29 DIAGNOSIS — L89312 Pressure ulcer of right buttock, stage 2: Secondary | ICD-10-CM | POA: Diagnosis not present

## 2018-11-29 DIAGNOSIS — B9562 Methicillin resistant Staphylococcus aureus infection as the cause of diseases classified elsewhere: Secondary | ICD-10-CM | POA: Diagnosis not present

## 2018-11-29 DIAGNOSIS — C541 Malignant neoplasm of endometrium: Secondary | ICD-10-CM | POA: Diagnosis not present

## 2018-11-29 DIAGNOSIS — D631 Anemia in chronic kidney disease: Secondary | ICD-10-CM | POA: Diagnosis not present

## 2018-11-29 DIAGNOSIS — N182 Chronic kidney disease, stage 2 (mild): Secondary | ICD-10-CM | POA: Diagnosis not present

## 2018-11-29 DIAGNOSIS — T83592A Infection and inflammatory reaction due to indwelling ureteral stent, initial encounter: Secondary | ICD-10-CM | POA: Diagnosis not present

## 2018-11-29 DIAGNOSIS — E1122 Type 2 diabetes mellitus with diabetic chronic kidney disease: Secondary | ICD-10-CM | POA: Diagnosis not present

## 2018-11-29 DIAGNOSIS — Z452 Encounter for adjustment and management of vascular access device: Secondary | ICD-10-CM | POA: Diagnosis not present

## 2018-11-29 LAB — CBC AND DIFFERENTIAL
HCT: 35 — AB (ref 36–46)
Hemoglobin: 10.8 — AB (ref 12.0–16.0)
Neutrophils Absolute: 3
Platelets: 273 (ref 150–399)
WBC: 4

## 2018-11-29 LAB — BASIC METABOLIC PANEL
BUN: 22 — AB (ref 4–21)
Creatinine: 1.3 — AB (ref 0.5–1.1)
Glucose: 131
Potassium: 4.4 (ref 3.4–5.3)
Sodium: 142 (ref 137–147)

## 2018-11-29 LAB — HEPATIC FUNCTION PANEL
ALT: 70 — AB (ref 7–35)
AST: 46 — AB (ref 13–35)
Alkaline Phosphatase: 377 — AB (ref 25–125)
Bilirubin, Total: 0.2

## 2018-11-30 ENCOUNTER — Encounter: Payer: Self-pay | Admitting: Hematology and Oncology

## 2018-11-30 DIAGNOSIS — E860 Dehydration: Secondary | ICD-10-CM | POA: Diagnosis not present

## 2018-11-30 DIAGNOSIS — E785 Hyperlipidemia, unspecified: Secondary | ICD-10-CM | POA: Diagnosis not present

## 2018-11-30 DIAGNOSIS — L89309 Pressure ulcer of unspecified buttock, unspecified stage: Secondary | ICD-10-CM | POA: Diagnosis not present

## 2018-12-01 ENCOUNTER — Other Ambulatory Visit: Payer: Self-pay | Admitting: Hematology and Oncology

## 2018-12-01 ENCOUNTER — Telehealth: Payer: Self-pay

## 2018-12-01 DIAGNOSIS — L89153 Pressure ulcer of sacral region, stage 3: Secondary | ICD-10-CM | POA: Diagnosis not present

## 2018-12-01 DIAGNOSIS — E1122 Type 2 diabetes mellitus with diabetic chronic kidney disease: Secondary | ICD-10-CM | POA: Diagnosis not present

## 2018-12-01 DIAGNOSIS — N179 Acute kidney failure, unspecified: Secondary | ICD-10-CM | POA: Diagnosis not present

## 2018-12-01 DIAGNOSIS — L89309 Pressure ulcer of unspecified buttock, unspecified stage: Secondary | ICD-10-CM | POA: Diagnosis not present

## 2018-12-01 DIAGNOSIS — L89311 Pressure ulcer of right buttock, stage 1: Secondary | ICD-10-CM | POA: Diagnosis not present

## 2018-12-01 DIAGNOSIS — T83592A Infection and inflammatory reaction due to indwelling ureteral stent, initial encounter: Secondary | ICD-10-CM | POA: Diagnosis not present

## 2018-12-01 DIAGNOSIS — L89312 Pressure ulcer of right buttock, stage 2: Secondary | ICD-10-CM | POA: Diagnosis not present

## 2018-12-01 DIAGNOSIS — D709 Neutropenia, unspecified: Secondary | ICD-10-CM | POA: Diagnosis not present

## 2018-12-01 DIAGNOSIS — E785 Hyperlipidemia, unspecified: Secondary | ICD-10-CM | POA: Diagnosis not present

## 2018-12-01 DIAGNOSIS — C541 Malignant neoplasm of endometrium: Secondary | ICD-10-CM | POA: Diagnosis not present

## 2018-12-01 DIAGNOSIS — T82594D Other mechanical complication of infusion catheter, subsequent encounter: Secondary | ICD-10-CM | POA: Diagnosis not present

## 2018-12-01 DIAGNOSIS — D631 Anemia in chronic kidney disease: Secondary | ICD-10-CM | POA: Diagnosis not present

## 2018-12-01 DIAGNOSIS — E860 Dehydration: Secondary | ICD-10-CM | POA: Diagnosis not present

## 2018-12-01 DIAGNOSIS — B9562 Methicillin resistant Staphylococcus aureus infection as the cause of diseases classified elsewhere: Secondary | ICD-10-CM | POA: Diagnosis not present

## 2018-12-01 DIAGNOSIS — N182 Chronic kidney disease, stage 2 (mild): Secondary | ICD-10-CM | POA: Diagnosis not present

## 2018-12-01 DIAGNOSIS — Z452 Encounter for adjustment and management of vascular access device: Secondary | ICD-10-CM | POA: Diagnosis not present

## 2018-12-01 MED ORDER — GRANIX 480 MCG/0.8ML ~~LOC~~ SOSY: PREFILLED_SYRINGE | SUBCUTANEOUS | 0 refills | Status: DC

## 2018-12-01 NOTE — Telephone Encounter (Signed)
-----   Message from Heath Lark, MD sent at 12/01/2018  9:05 AM EDT ----- Regarding: pls check with [facility] Please check with [facility] to make sure they have received the electronic request If not resolved today, please pass the message to the Monday desk nurse to follow

## 2018-12-01 NOTE — Telephone Encounter (Signed)
[place]. They received the Granix Rx this morning. They have made this a urgent request and hopefully be calling to sit up delivery later today.

## 2018-12-04 ENCOUNTER — Other Ambulatory Visit: Payer: Self-pay | Admitting: Hematology and Oncology

## 2018-12-04 ENCOUNTER — Telehealth: Payer: Self-pay | Admitting: *Deleted

## 2018-12-04 DIAGNOSIS — L89319 Pressure ulcer of right buttock, unspecified stage: Secondary | ICD-10-CM | POA: Diagnosis not present

## 2018-12-04 DIAGNOSIS — Z936 Other artificial openings of urinary tract status: Secondary | ICD-10-CM | POA: Diagnosis not present

## 2018-12-04 DIAGNOSIS — Z933 Colostomy status: Secondary | ICD-10-CM | POA: Diagnosis not present

## 2018-12-04 DIAGNOSIS — D709 Neutropenia, unspecified: Secondary | ICD-10-CM

## 2018-12-04 MED ORDER — FILGRASTIM-SNDZ 480 MCG/0.8ML IJ SOSY: 480.0000 ug | PREFILLED_SYRINGE | INTRAMUSCULAR | 99 refills | Status: DC

## 2018-12-04 NOTE — Telephone Encounter (Signed)
Last week she asked for refill through [facility] When [name] called, she documented [facility] will get Granix approved DO you want me to send Zarxio to [facility] now? Can you call her first and verified she has been getting her Granix through [facility]?

## 2018-12-04 NOTE — Telephone Encounter (Signed)
Three way conference call with patient's husband and [place]. The shipment department could not see the approved and authorized Granix prescription the pharmacist advised RN about earlier today. Patient called and was not able to have medication shipped stating it needed a prior authorization. RN called the prior authorization department and they determined this medication was not needing authorization as it was a preferred medication.  [facility] Rep returned call and stated this needed an authorization code. No medication will be shipped tomorrow.   This RN completed authorization on [employer] with patient on the phone: Prior authorization completed for Zarxio. Authorization number [id number] expires 06/02/2019. Patient is all set to start receiving this medication on January 01, 2019.    [facility] shipping department was able to confirm with pharmacist and ship out the remaining Granix prescription for her May doses. Patient's husband confirms overnight deliver was ordered. Will follow up in the morning.

## 2018-12-04 NOTE — Telephone Encounter (Signed)
The pharmacy has 3 rx's. There are 2 for granix and 1 for Zarxio. The patient has one order that is ready to ship and the pharmacy is just waiting for the patient to authorize shipment. The Zarxio script has been sent to be confirmed by insurance. It may need a prior auth and they will contact us if it does. The other granix prescription has been cancelled, insurance will not pay for this since patient had a positive response to Zarxio. Husband notified.

## 2018-12-04 NOTE — Telephone Encounter (Signed)
Pharmacy called back. They are not able to process the Granix for the month of May. They are sending out Zarxio tomorrow. Pharmacist will notify patient directly. RN will follow up with patient tomorrow to make sure medication was received.

## 2018-12-04 NOTE — Telephone Encounter (Signed)
Left a message for patient to return call.

## 2018-12-04 NOTE — Telephone Encounter (Signed)
[facility] called to notify the Granix injections will not be covered by patient's insurance. They will cover Zarxio or Nivestym.  Please advise if you would change to one of the alternatives.

## 2018-12-05 ENCOUNTER — Telehealth: Payer: Self-pay | Admitting: *Deleted

## 2018-12-05 ENCOUNTER — Telehealth: Payer: Self-pay | Admitting: Internal Medicine

## 2018-12-05 DIAGNOSIS — L89309 Pressure ulcer of unspecified buttock, unspecified stage: Secondary | ICD-10-CM | POA: Diagnosis not present

## 2018-12-05 DIAGNOSIS — E860 Dehydration: Secondary | ICD-10-CM | POA: Diagnosis not present

## 2018-12-05 DIAGNOSIS — E785 Hyperlipidemia, unspecified: Secondary | ICD-10-CM | POA: Diagnosis not present

## 2018-12-05 NOTE — Telephone Encounter (Signed)
[name] is now completed 8 days of her meropenem for her symptomatic Acinetobacter UTI. She is feeling better today but has been at 1 of her "lowest points" mentally and physically recently.  She continues to have intermittent bloody discharge around her Foley catheter and suprapubic and rectal "pressure".  She has not been febrile.  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  I reminded her that the antibiotics will not have any positive impact on the fistula.  She did get a letter from her urologist at [facility] letting her know that she is on the waiting list for her planned surgery but no date has been set.

## 2018-12-05 NOTE — Telephone Encounter (Signed)
Received  Detailed WrittenPhysician Orders for [employer] from [facility]; forwarded to provider/SLS 05/05

## 2018-12-05 NOTE — Telephone Encounter (Signed)
Received Lab Report results from [facility] via [facility]; forwarded to provider/SLS 05/05

## 2018-12-06 ENCOUNTER — Encounter: Payer: Self-pay | Admitting: Family Medicine

## 2018-12-06 DIAGNOSIS — T83592A Infection and inflammatory reaction due to indwelling ureteral stent, initial encounter: Secondary | ICD-10-CM | POA: Diagnosis not present

## 2018-12-06 DIAGNOSIS — E860 Dehydration: Secondary | ICD-10-CM | POA: Diagnosis not present

## 2018-12-06 DIAGNOSIS — L89309 Pressure ulcer of unspecified buttock, unspecified stage: Secondary | ICD-10-CM | POA: Diagnosis not present

## 2018-12-06 DIAGNOSIS — T82594D Other mechanical complication of infusion catheter, subsequent encounter: Secondary | ICD-10-CM | POA: Diagnosis not present

## 2018-12-06 DIAGNOSIS — E785 Hyperlipidemia, unspecified: Secondary | ICD-10-CM | POA: Diagnosis not present

## 2018-12-07 ENCOUNTER — Encounter (INDEPENDENT_AMBULATORY_CARE_PROVIDER_SITE_OTHER): Payer: Self-pay | Admitting: Family Medicine

## 2018-12-07 ENCOUNTER — Ambulatory Visit (INDEPENDENT_AMBULATORY_CARE_PROVIDER_SITE_OTHER): Payer: BLUE CROSS/BLUE SHIELD | Admitting: Family Medicine

## 2018-12-07 ENCOUNTER — Other Ambulatory Visit: Payer: Self-pay

## 2018-12-07 DIAGNOSIS — Z6836 Body mass index (BMI) 36.0-36.9, adult: Secondary | ICD-10-CM | POA: Diagnosis not present

## 2018-12-07 DIAGNOSIS — F3289 Other specified depressive episodes: Secondary | ICD-10-CM | POA: Diagnosis not present

## 2018-12-07 MED ORDER — BUPROPION HCL ER (SR) 200 MG PO TB12: 200.0000 mg | ORAL_TABLET | Freq: Every day | ORAL | 0 refills | Status: DC

## 2018-12-07 NOTE — Telephone Encounter (Signed)
Patient left message asking if she could continue antibiotics, stating that she feels her symptoms are better on the antibiotics and she would like to stay on them. Please advise. Landis Gandy, RN

## 2018-12-07 NOTE — Telephone Encounter (Signed)
Keeping original stop date per Dr Tommy Medal. RN notified patient and [facility]).

## 2018-12-07 NOTE — Telephone Encounter (Signed)
NO good reason to extend beyond course planned by Dr. Megan Salon

## 2018-12-07 NOTE — Progress Notes (Signed)
Office: [phone]  /  Fax: [phone] TeleHealth Visit:  Carmelia Bake Marro has verbally consented to this TeleHealth visit today. The patient is located at home, the provider is located at the [employer] [facility] office. The participants in this visit include the listed provider and patient. The visit was conducted today via Face Time.  HPI:   Chief Complaint: OBESITY Lylee is here to discuss her progress with her obesity treatment plan. She is keeping a food journal with 1200 to 1300 calories and 75 grams of protein and is following her eating plan approximately 75 % of the time. She states she is exercising 0 minutes 0 times per week. Lolah has been journaling most days and is trying to meet her goals, but has a hard time not going over her calories. She states this is due to hunger, especially when she isn't meeting her protein goals.  We were unable to weigh the patient today for this TeleHealth visit. She feels as if she has gained weight since her last visit. She has lost 18 lbs since starting treatment with [place].  Depression with emotional eating behaviors Mailyn's mood is stable on Wellbutrin overall. She is still struggling with meal planning and using food for comfort to the extent that it is negatively impacting her health. She often snacks when she is not hungry. Dahlia sometimes feels she is out of control and then feels guilty that she made poor food choices. She has been working on behavior modification techniques to help reduce her emotional eating and has been somewhat successful. She denies insomnia or palpitations.  Depression screen [facility] 2/9 11/22/2018 11/07/2018 04/20/2018 04/05/2018 06/21/2017  Decreased Interest 0 0 0 1 0  Down, Depressed, Hopeless 0 0 1 1 0  PHQ - 2 Score 0 0 1 2 0  Altered sleeping - - 0 0 -  Tired, decreased energy - - 1 1 -  Change in appetite - - 0 1 -  Feeling bad or failure about yourself  - - 0 0 -  Trouble concentrating - - 0 0 -  Moving  slowly or fidgety/restless - - 0 0 -  Suicidal thoughts - - 0 0 -  PHQ-9 Score - - 2 4 -  Difficult doing work/chores - - - Somewhat difficult -  Some recent data might be hidden   ASSESSMENT AND PLAN:  Other depression - with emotional eating - Plan: buPROPion (WELLBUTRIN SR) 200 MG 12 hr tablet  Class 2 severe obesity with serious comorbidity and body mass index (BMI) of 36.0 to 36.9 in adult, unspecified obesity type (HCC)  PLAN:  Depression with Emotional Eating Behaviors We discussed behavior modification techniques today to help Tawney deal with her emotional eating and depression. She has agreed to take Wellbutrin SR 200 mg qd and agreed to follow up as directed in 2 weeks.  I spent > than 50% of the 25 minute visit on counseling as documented in the note.  Obesity Sumiye is currently in the action stage of change. As such, her goal is to continue with weight loss efforts. She has agreed to keep a food journal with 1200 to 1300 calories and 75+ grams of protein.  Lielle has been instructed to work up to a goal of 150 minutes of combined cardio and strengthening exercise per week for weight loss and overall health benefits. We discussed the following Behavioral Modification Strategies today: increasing lean protein intake, increasing vegetables, keeping healthy foods in the home, better snacking  choices, and emotional eating strategies.  Aamiyah has agreed to follow up with our clinic in 2 weeks. She was informed of the importance of frequent follow up visits to maximize her success with intensive lifestyle modifications for her multiple health conditions.  ALLERGIES: Allergies  Allergen Reactions  . Penicillins Swelling    Facial swelling/childhood allergy Has patient had a PCN reaction causing immediate rash, facial/tongue/throat swelling, SOB or lightheadedness with hypotension: Yes Has patient had a PCN reaction causing severe rash involving mucus membranes or skin necrosis:  Yes Has patient had a PCN reaction that required hospitalization yes Has patient had a PCN reaction occurring within the last 10 years: No If all of the above answers are "NO", then may proceed with Cephalosporin use.   . Cefaclor Rash    Ceclor  . Erythromycin Other (See Comments)    Gastritis, abd cramps  . Tape Rash    blisters  . Trimethoprim Rash  . Ultram [Tramadol] Hives  . Cephalosporins Rash  . Fluconazole Rash  . Oxycodone Other (See Comments)    " I just feel weird"  . Pectin Rash    Pectin ring for stoma  . Septra [Sulfamethoxazole-Trimethoprim] Rash  . Sulfa Antibiotics Rash    MEDICATIONS: Current Outpatient Medications on File Prior to Visit  Medication Sig Dispense Refill  . acetaminophen (TYLENOL) 325 MG tablet Take 2 tablets (650 mg total) by mouth every 6 (six) hours as needed. 30 tablet 1  . anastrozole (ARIMIDEX) 1 MG tablet TAKE 1 TABLET DAILY (Patient taking differently: Take 1 mg by mouth daily. ) 90 tablet 3  . Biotin 5 MG TABS Take 5 mg by mouth every morning.     . Cholecalciferol (VITAMIN D3) 10000 UNITS capsule Take 10,000 Units by mouth once a week. Sunday evening's    . clobetasol (OLUX) 0.05 % topical foam Apply topically 2 (two) times daily.    . diphenhydrAMINE (BENADRYL) 25 mg capsule Take 1 capsule (25 mg total) by mouth every 8 (eight) hours as needed for itching, allergies or sleep. 30 capsule 0  . diphenoxylate-atropine (LOMOTIL) 2.5-0.025 MG tablet Take 1 tablet by mouth 4 (four) times daily as needed for diarrhea or loose stools. TO PREVENT LOOSE BOWEL MOVEMENTS (Patient not taking: Reported on 11/07/2018) 30 tablet 0  . fentaNYL (DURAGESIC) 50 MCG/HR Place 1 patch onto the skin every 3 (three) days. 10 patch 0  . ferrous sulfate 325 (65 FE) MG tablet Take 1 tablet (325 mg total) by mouth at bedtime. 30 tablet 3  . filgrastim-sndz (ZARXIO) 480 MCG/0.8ML SOSY injection Inject 0.8 mLs (480 mcg total) into the skin as directed. Inject 480 mcg  every 5 days 0.8 mL 99  . GRANIX 480 MCG/0.8ML SOSY injection Inject 480 mcg every 5 days indefinitely 0.8 mL 0  . HYDROcodone-acetaminophen (NORCO/VICODIN) 5-325 MG tablet Take 2 tablets by mouth every 6 (six) hours as needed for moderate pain.    [name] Kitchen JANUVIA 50 MG tablet Take 50 mg by mouth daily.     [name] Kitchen levothyroxine (SYNTHROID, LEVOTHROID) 150 MCG tablet Take 1 tablet (150 mcg total) by mouth daily before breakfast. 30 tablet 1  . loratadine (CLARITIN) 10 MG tablet Take 10 mg by mouth every morning.     . meropenem 1 g in sodium chloride 0.9 % 100 mL Inject 1 g into the vein every 12 (twelve) hours for 10 days.    . nitrofurantoin, macrocrystal-monohydrate, (MACROBID) 100 MG capsule Take 100 mg by mouth at bedtime.    [name] Kitchen  omega-3 acid ethyl esters (LOVAZA) 1 G capsule Take 1 g by mouth 2 (two) times daily.     [name] Kitchen oxybutynin (DITROPAN) 5 MG tablet     . Polyethyl Glycol-Propyl Glycol (SYSTANE OP) Place 1 drop into both eyes daily as needed (dry eyes).     . pregabalin (LYRICA) 50 MG capsule TAKE 1 CAPSULE(50 MG) BY MOUTH TWICE DAILY 180 capsule 3  . Prenatal Vit-Fe Fumarate-FA (PRENATAL VITAMIN PO) Take 1 capsule by mouth daily. Takes prenatal because there are no dyes in it    . rosuvastatin (CRESTOR) 10 MG tablet Take 10 mg by mouth every evening.     . Saccharomyces boulardii (FLORASTOR PO) Take 1 capsule by mouth daily.    . sodium bicarbonate 650 MG tablet Take 650 mg by mouth 2 (two) times daily.    . sodium chloride 0.9 % injection Inject 10 mLs into the vein daily.     No current facility-administered medications on file prior to visit.     PAST MEDICAL HISTORY: Past Medical History:  Diagnosis Date  . Adrenal adenoma, left 02/08/2016   CT: stable benign  . Anemia in neoplastic disease   . Back pain   . Benign essential HTN   . 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 Cancer of corpus uteri, except isthmus [facility])  oncologist-- dr Denman [name] and dr Alvy Bimler    10-15-2004  dx endometroid endometrial and ovarian cancer s/p  chemotheapy and surgery(TAH w/ BSO) :  recurrent 11-19-2014 post pelvic surgery and radiation 01-29-2015 to 03-10-2015  . Chronic idiopathic neutropenia (HCC)    presumed related to chemotherapy March 2006--- followed by dr Alvy Bimler (treatment w/ G-CSF injections  . Chronic nausea   . Chronic pain    perineal/ anal  area from bladder pad irritates skin , right flank pain  . CKD stage G2/A3, GFR 60-89 and albumin creatinine ratio >300 mg/g    nephrologist-  dr Madelon Lips  . Colovesical fistula   . Diabetic retinopathy, background ([place])   . Difficult intravenous access    small veins--- hx PICC lines  . DM type 2 (diabetes mellitus, type 2) ([place])    monitored by dr Legrand Como altheimer  . Dysuria   . Environmental and seasonal allergies   . Fatty liver 02/08/2016   CT  . Generalized muscle weakness   . GERD (gastroesophageal reflux disease)   . Hiatal hernia   . History of abdominal abscess 04/16/2017   post surgery 04-01-2017  --- resolved 10/ 2018  . History of gastric polyp    2014  duodenum  . History of ileus 04/16/2017   resolved w/ no surgical intervention  . History of radiation therapy    01-29-2015 to 03-10-2015  pelvis 50.4Gy  . Hypothyroidism    monitored by dr Legrand Como altheimer  . IBS (irritable bowel syndrome)   . Ileostomy in place [facility]) 04/01/2017   created at same time colostomy takedown.  . Joint pain   . Leg edema   . Lower urinary tract symptoms (LUTS)    urge urinary  incontinence  . Mixed dyslipidemia   . Multiple thyroid nodules    Managed by Dr. Harlow Asa  . Nephrostomy status ([place])   . Palpitations   . Pelvic abscess in female 04/16/2017  . PONV (postoperative nausea and  vomiting)    "scopolamine patch works for me"  .  Radiation-induced dermatitis    contact dermatitis , radiation completed, rash only on ankles now.  . Seasonal allergies   . Ureteral stricture, right UROLOGIT-  DR [facility]   CHRONIC--  TREATMENT URETERAL STENT  . 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 Vitamin D deficiency   . Wears glasses     PAST SURGICAL HISTORY: Past Surgical History:  Procedure Laterality Date  . APPENDECTOMY    . biopsy thyroid nodules    . BREAST RECONSTRUCTION WITH PLACEMENT OF TISSUE EXPANDER AND FLEX HD (ACELLULAR HYDRATED DERMIS) Bilateral 12/11/2015   Procedure: BILATERAL BREAST RECONSTRUCTION WITH PLACEMENT OF TISSUE EXPANDERS;  Surgeon: Irene Limbo, MD;  Location: [facility];  Service: Plastics;  Laterality: Bilateral;  . COLONOSCOPY WITH PROPOFOL N/A 08/21/2013   Procedure: COLONOSCOPY WITH PROPOFOL;  Surgeon: Cleotis Nipper, MD;  [facility];  Service: Endoscopy;  Laterality: N/A;  . COLOSTOMY TAKEDOWN N/A 12/04/2014   Procedure: LAPROSCOPIC LYSIS OF ADHESIONS, SPLENIC MOBILIZATION, RELOCATION OF COLOSTOMY, DEBRIDEMENT INITIAL COLOSTOMY SITE;  Surgeon: Michael [name], MD;  [facility];  Service: General;  Laterality: N/A;  . CYSTOGRAM N/A 06/01/2017   Procedure: CYSTOGRAM;  Surgeon: Alexis Frock, MD;  [facility];  Service: Urology;  Laterality: N/A;  . CYSTOSCOPY W/ RETROGRADES Right 11/21/2015   Procedure: CYSTOSCOPY WITH RETROGRADE PYELOGRAM;  Surgeon: Alexis Frock, MD;  [facility];  Service: Urology;  Laterality: Right;  . CYSTOSCOPY W/ URETERAL STENT PLACEMENT Right 11/21/2015   Procedure: CYSTOSCOPY WITH STENT REPLACEMENT;  Surgeon: Alexis Frock, MD;  [facility];  Service: Urology;  Laterality: Right;  . CYSTOSCOPY W/ URETERAL STENT PLACEMENT Right 03/10/2016   Procedure: CYSTOSCOPY WITH STENT REPLACEMENT;  Surgeon: Alexis Frock, MD;  Location: [facility];  Service: Urology;  Laterality: Right;  . CYSTOSCOPY W/ URETERAL STENT PLACEMENT Right  06/30/2016   Procedure: CYSTOSCOPY WITH RETROGRADE PYELOGRAM/URETERAL STENT EXCHANGE;  Surgeon: Alexis Frock, MD;  Location: [facility];  Service: Urology;  Laterality: Right;  . CYSTOSCOPY W/ URETERAL STENT PLACEMENT N/A 06/01/2017   Procedure: CYSTOSCOPY WITH EXAM UNDER ANESTHESIA;  Surgeon: Alexis Frock, MD;  [facility];  Service: Urology;  Laterality: N/A;  . CYSTOSCOPY W/ URETERAL STENT PLACEMENT Right 08/17/2017   Procedure: CYSTOSCOPY WITH RETROGRADE PYELOGRAM/URETERAL STENT REMOVAL;  Surgeon: Alexis Frock, MD;  Location: [facility];  Service: Urology;  Laterality: Right;  . CYSTOSCOPY WITH RETROGRADE PYELOGRAM, URETEROSCOPY AND STENT PLACEMENT Right 03/20/2015   Procedure: CYSTOSCOPY WITH RETROGRADE PYELOGRAM, URETEROSCOPY WITH BALLOON DILATION AND STENT PLACEMENT ON RIGHT;  Surgeon: Alexis Frock, MD;  Location: [facility];  Service: Urology;  Laterality: Right;  . CYSTOSCOPY WITH RETROGRADE PYELOGRAM, URETEROSCOPY AND STENT PLACEMENT Right 05/02/2015   Procedure: CYSTOSCOPY WITH RIGHT RETROGRADE PYELOGRAM,  DIAGNOSTIC URETEROSCOPY AND STENT PULL ;  Surgeon: Alexis Frock, MD;  Location: [facility];  Service: Urology;  Laterality: Right;  . CYSTOSCOPY WITH RETROGRADE PYELOGRAM, URETEROSCOPY AND STENT PLACEMENT Right 09/05/2015   Procedure: CYSTOSCOPY WITH RETROGRADE PYELOGRAM,  AND STENT PLACEMENT;  Surgeon: Alexis Frock, MD;  [facility];  Service: Urology;  Laterality: Right;  . CYSTOSCOPY WITH RETROGRADE PYELOGRAM, URETEROSCOPY AND STENT PLACEMENT Right 04/01/2017   Procedure: CYSTOSCOPY WITH RETROGRADE PYELOGRAM, URETEROSCOPY AND STENT PLACEMENT;  Surgeon: Alexis Frock, MD;  [facility];  Service: Urology;  Laterality: Right;  . CYSTOSCOPY WITH STENT PLACEMENT Right 10/27/2016   Procedure: CYSTOSCOPY WITH STENT CHANGE  and right retrograde pyelogram;  Surgeon: Alexis Frock, MD;  Location: [facility];  Service: Urology;  Laterality: Right;  . EUS N/A 10/02/2014   Procedure: LOWER ENDOSCOPIC ULTRASOUND (EUS);  Surgeon: Arta Silence, MD;  Location: [facility];  Service: Endoscopy;  Laterality: N/A;  . EXCISION SOFT TISSUE MASS RIGHT FOREMAN  12-08-2006  . EYE SURGERY  as child   pytosis of eyelids repair  . INCISION AND DRAINAGE OF WOUND Bilateral 12/26/2015   Procedure: DEBRIDEMENT OF BILATERAL MASTECTOMY FLAPS;  Surgeon: Irene Limbo, MD;  Location: [place];  Service: Plastics;  Laterality: Bilateral;  . IR CV LINE INJECTION  05/31/2017  . IR FLUORO GUIDE CV LINE LEFT  05/31/2017  . IR FLUORO GUIDE CV LINE RIGHT  04/06/2017  . IR FLUORO GUIDE CV MIDLINE PICC RIGHT  05/30/2017  . IR NEPHROSTOGRAM LEFT INITIAL PLACEMENT  09/02/2017  . IR NEPHROSTOGRAM LEFT THRU EXISTING ACCESS  11/29/2017  . IR NEPHROSTOGRAM RIGHT INITIAL PLACEMENT  09/02/2017  . IR NEPHROSTOGRAM RIGHT THRU EXISTING ACCESS  09/13/2017  . IR NEPHROSTOGRAM RIGHT THRU EXISTING ACCESS  11/29/2017  . IR NEPHROSTOMY EXCHANGE LEFT  11/28/2017  . IR NEPHROSTOMY EXCHANGE LEFT  01/05/2018  . IR NEPHROSTOMY EXCHANGE LEFT  02/16/2018  . IR NEPHROSTOMY EXCHANGE LEFT  03/30/2018  . IR NEPHROSTOMY EXCHANGE LEFT  05/12/2018  . IR NEPHROSTOMY EXCHANGE LEFT  06/21/2018  . IR NEPHROSTOMY EXCHANGE LEFT  08/04/2018  . IR NEPHROSTOMY EXCHANGE LEFT  09/18/2018  . IR NEPHROSTOMY EXCHANGE LEFT  10/09/2018  . IR NEPHROSTOMY EXCHANGE LEFT  10/27/2018  . IR NEPHROSTOMY EXCHANGE LEFT  11/21/2018  . IR NEPHROSTOMY EXCHANGE RIGHT  10/02/2017  . IR NEPHROSTOMY EXCHANGE RIGHT  11/28/2017  . IR NEPHROSTOMY EXCHANGE RIGHT  01/05/2018  . IR NEPHROSTOMY EXCHANGE RIGHT  02/16/2018  . IR NEPHROSTOMY EXCHANGE RIGHT  03/30/2018  . IR NEPHROSTOMY EXCHANGE RIGHT  05/12/2018  . IR NEPHROSTOMY EXCHANGE RIGHT  06/21/2018  . IR NEPHROSTOMY EXCHANGE RIGHT  08/04/2018  . IR NEPHROSTOMY EXCHANGE RIGHT  09/18/2018  . IR NEPHROSTOMY EXCHANGE RIGHT   10/27/2018  . IR NEPHROSTOMY EXCHANGE RIGHT  11/21/2018  . IR NEPHROSTOMY PLACEMENT LEFT  10/02/2017  . IR RADIOLOGIST EVAL & MGMT  05/03/2017  . IR US GUIDE VASC ACCESS LEFT  05/31/2017  . IR US GUIDE VASC ACCESS RIGHT  04/06/2017  . IR US GUIDE VASC ACCESS RIGHT  05/30/2017  . LAPAROSCOPIC CHOLECYSTECTOMY  1990  . LIPOSUCTION WITH LIPOFILLING Bilateral 04/16/2016   Procedure: LIPOSUCTION WITH LIPOFILLING TO BILATERAL CHEST;  Surgeon: Irene Limbo, MD;  Location: [facility];  Service: Plastics;  Laterality: Bilateral;  . MASTECTOMY W/ SENTINEL NODE BIOPSY Bilateral 12/11/2015   Procedure: RIGHT PROPHYLACTIC MASTECTOMY, LEFT TOTAL MASTECTOMY WITH LEFT AXILLARY SENTINEL LYMPH NODE BIOPSY;  Surgeon: Stark Klein, MD;  Location: [place];  Service: General;  Laterality: Bilateral;  . OSTOMY N/A 11/19/2014   Procedure: OSTOMY;  Surgeon: Michael [name], MD;  [facility];  Service: General;  Laterality: N/A;  . PROCTOSCOPY N/A 04/01/2017   Procedure: RIDGE PROCTOSCOPY;  Surgeon: Michael [name], MD;  [facility];  Service: General;  Laterality: N/A;  . REMOVAL OF BILATERAL TISSUE EXPANDERS WITH PLACEMENT OF BILATERAL BREAST IMPLANTS Bilateral 04/16/2016   Procedure: REMOVAL OF BILATERAL TISSUE EXPANDERS WITH PLACEMENT OF BILATERAL BREAST IMPLANTS;  Surgeon: Irene Limbo, MD;  Location: [facility];  Service: Plastics;  Laterality: Bilateral;  . ROBOTIC ASSISTED LAP VAGINAL HYSTERECTOMY N/A 11/19/2014  Procedure: ROBOTIC LYSIS OF ADHESIONS, CONVERTED TO LAPAROTOMY RADICAL UPPER VAGINECTOMY,LOW ANTERIOR BOWEL RESECTION, COLOSTOMY, BILATERAL URETERAL STENT PLACEMENT AND CYSTONOMY CLOSURE;  Surgeon: Everitt Amber, MD;  [facility];  Service: Gynecology;  Laterality: N/A;  . TISSUE EXPANDER FILLING Bilateral 12/26/2015   Procedure: EXPANSION OF BILATERAL CHEST TISSUE EXPANDERS (60 mL- Right; 75 mL- Left);  Surgeon: Irene Limbo, MD;  Location: [place];   Service: Plastics;  Laterality: Bilateral;  . TONSILLECTOMY    . TOTAL ABDOMINAL HYSTERECTOMY  March 2006   [facility]   and Bilateral Salpingoophorectomy/  staging for Ovarian cancer/  an  . XI ROBOTIC ASSISTED LOWER ANTERIOR RESECTION N/A 04/01/2017   Procedure: XI ROBOTIC VS LAPAROSCOPIC COLOSTOMY TAKEDOWN WITH LYSIS OF ADHESIONS.;  Surgeon: Michael [name], MD;  [facility];  Service: General;  Laterality: N/A;  ERAS PATHWAY    SOCIAL HISTORY: Social History   Tobacco Use  . Smoking status: Never Smoker  . Smokeless tobacco: Never Used  Substance Use Topics  . Alcohol use: Not Currently  . Drug use: No    FAMILY HISTORY: Family History  Problem Relation Age of Onset  . Cancer Mother 50       stomach ca  . Hypertension Mother   . Cancer Father 70       prostate ca  . Diabetes Father   . Heart disease Father        CABG  . Hypertension Father   . Hyperlipidemia Father   . Obesity Father   . Breast cancer Maternal Aunt        dx in her 60s  . Lymphoma Paternal Aunt   . Brain cancer Paternal Grandfather   . Ovarian cancer Other   . Diabetes Sister   . Hypertension Brother y-10  . Heart disease Brother        CABG  . Diabetes Brother     ROS: Review of Systems  Cardiovascular: Negative for palpitations.  Psychiatric/Behavioral: Positive for depression. The patient does not have insomnia.     PHYSICAL EXAM: Pt in no acute distress  RECENT LABS AND TESTS: BMET    Component Value Date/Time   NA 136 10/04/2018 1345   NA 141 01/24/2017 1228   K 3.6 10/04/2018 1345   K 4.6 01/24/2017 1228   CL 106 10/04/2018 1345   CO2 20 (L) 10/04/2018 1345   CO2 29 01/24/2017 1228   GLUCOSE 132 (H) 10/04/2018 1345   GLUCOSE 84 01/24/2017 1228   BUN 36 (H) 10/04/2018 1345   BUN 22.2 01/24/2017 1228   CREATININE 1.94 (H) 10/04/2018 1345   CREATININE 1.98 (H) 03/27/2018 1324   CREATININE 0.8 01/24/2017 1228   CALCIUM 9.3 10/04/2018 1345   CALCIUM 10.2 01/24/2017 1228    GFRNONAA 27 (L) 10/04/2018 1345   GFRNONAA 26 (L) 03/27/2018 1324   GFRNONAA 78 12/16/2014 1530   GFRAA 31 (L) 10/04/2018 1345   GFRAA 30 (L) 03/27/2018 1324   GFRAA >89 12/16/2014 1530   Lab Results  Component Value Date   HGBA1C 7.9 (H) 09/01/2017   HGBA1C 5.3 04/01/2017   HGBA1C 5.8 (H) 09/05/2015   HGBA1C 6.8 (H) 11/21/2014   HGBA1C 6.3 (H) 11/07/2014   No results found for: INSULIN CBC    Component Value Date/Time   WBC 3.8 (L) 10/04/2018 1345   RBC 3.80 (L) 10/04/2018 1345   HGB 11.2 (L) 10/04/2018 1345   HGB 10.2 (L) 03/27/2018 1324   HGB 12.4 07/29/2017 1444  HCT 36.7 10/04/2018 1345   HCT 38.0 07/29/2017 1444   PLT 282 10/04/2018 1345   PLT 268 03/27/2018 1324   PLT 260 07/29/2017 1444   MCV 96.6 10/04/2018 1345   MCV 90.9 07/29/2017 1444   MCH 29.5 10/04/2018 1345   MCHC 30.5 10/04/2018 1345   RDW 13.7 10/04/2018 1345   RDW 15.5 (H) 07/29/2017 1444   LYMPHSABS 0.7 10/04/2018 1345   LYMPHSABS 0.8 (L) 07/29/2017 1444   MONOABS 0.3 10/04/2018 1345   MONOABS 1.0 (H) 07/29/2017 1444   EOSABS 0.0 10/04/2018 1345   EOSABS 0.0 07/29/2017 1444   BASOSABS 0.1 10/04/2018 1345   BASOSABS 0.0 07/29/2017 1444   Iron/TIBC/Ferritin/ %Sat    Component Value Date/Time   IRON 7 (L) 08/31/2017 0451   IRON 12 (L) 11/29/2014 1251   TIBC 164 (L) 08/31/2017 0451   TIBC 331 11/29/2014 1251   FERRITIN 27 11/29/2014 1251   IRONPCTSAT 4 (L) 08/31/2017 0451   IRONPCTSAT 4 (L) 11/29/2014 1251   Lipid Panel     Component Value Date/Time   CHOL 109 08/28/2015 1617   TRIG 89 04/18/2017 0427   HDL 43.80 08/28/2015 1617   CHOLHDL 2 08/28/2015 1617   VLDL 19.8 08/28/2015 1617   LDLCALC 45 08/28/2015 1617   Hepatic Function Panel     Component Value Date/Time   PROT 8.4 (H) 10/04/2018 1345   PROT 6.9 01/24/2017 1228   ALBUMIN 3.0 (L) 10/04/2018 1345   ALBUMIN 3.4 (L) 01/24/2017 1228   AST 28 10/04/2018 1345   AST 14 (L) 03/27/2018 1324   AST 16 01/24/2017 1228   ALT  55 (H) 10/04/2018 1345   ALT 18 03/27/2018 1324   ALT 14 01/24/2017 1228   ALKPHOS 550 (H) 10/04/2018 1345   ALKPHOS 90 01/24/2017 1228   BILITOT 0.5 10/04/2018 1345   BILITOT 0.2 (L) 03/27/2018 1324   BILITOT 0.34 01/24/2017 1228      Component Value Date/Time   TSH 2.408 08/30/2017 0623   TSH 0.63 08/28/2015 1617    Results for SAVAYAH, WALTRIP (MRN [id number]) as of 12/07/2018 15:34  Ref. Range 04/05/2018 11:45  Vitamin D, 25-Hydroxy Latest Ref Range: 30.0 - 100.0 ng/mL 40.8    I, Marcille Blanco, CMA, am acting as transcriptionist for Starlyn Skeans, MD I have reviewed the above documentation for accuracy and completeness, and I agree with the above. -Dennard Nip, MD

## 2018-12-08 ENCOUNTER — Encounter: Payer: Self-pay | Admitting: *Deleted

## 2018-12-08 ENCOUNTER — Inpatient Hospital Stay (HOSPITAL_COMMUNITY): Admission: RE | Admit: 2018-12-08 | Payer: Self-pay | Source: Ambulatory Visit

## 2018-12-08 ENCOUNTER — Telehealth: Payer: Self-pay

## 2018-12-08 DIAGNOSIS — L89309 Pressure ulcer of unspecified buttock, unspecified stage: Secondary | ICD-10-CM | POA: Diagnosis not present

## 2018-12-08 DIAGNOSIS — E785 Hyperlipidemia, unspecified: Secondary | ICD-10-CM | POA: Diagnosis not present

## 2018-12-08 DIAGNOSIS — E860 Dehydration: Secondary | ICD-10-CM | POA: Diagnosis not present

## 2018-12-08 NOTE — Telephone Encounter (Signed)
Patient states her husband has shingles and is her primary care taker.  She wonders what precautions they should take since she is immuno compromised.   Please advise   Laverle Patter, RN

## 2018-12-08 NOTE — Telephone Encounter (Signed)
I think if she had prior chicken pox little to worry since she will have immunity  Obviously NOT touch the rash  She could wear a mask if she DID not have chickenpox  Wants to take this precatuison  but this I likely complete overkill

## 2018-12-08 NOTE — Telephone Encounter (Signed)
Patient states she will wait and get Shingrix immunization when she comes back to office. States she is not Medicare and that she is on her husbands insurance.

## 2018-12-08 NOTE — Telephone Encounter (Signed)
She can not catch shingles --- -she can only get chicken pox if she has not had it You can offer shingrix but I think she is medicare?  If she has medicare she has to get it at the pharmacy

## 2018-12-08 NOTE — Telephone Encounter (Signed)
Copied from [place] [id number]. Topic: General - Other >> Dec 08, 2018  2:08 PM Carolyn Stare wrote:   Pt husband has shingles and is asking of there is anything she should do to protect her self

## 2018-12-08 NOTE — Telephone Encounter (Signed)
Pt had chicken pox as a child. She was advised not to touch affected areas and to make sure he keeps area covered.   Laverle Patter, RN

## 2018-12-12 ENCOUNTER — Telehealth: Payer: Self-pay | Admitting: *Deleted

## 2018-12-12 NOTE — Telephone Encounter (Signed)
Received Lab Report results from [place] via [place]; forwarded to provider/SLS 05/12

## 2018-12-13 DIAGNOSIS — T83592A Infection and inflammatory reaction due to indwelling ureteral stent, initial encounter: Secondary | ICD-10-CM | POA: Diagnosis not present

## 2018-12-13 DIAGNOSIS — E785 Hyperlipidemia, unspecified: Secondary | ICD-10-CM | POA: Diagnosis not present

## 2018-12-13 DIAGNOSIS — T82594D Other mechanical complication of infusion catheter, subsequent encounter: Secondary | ICD-10-CM | POA: Diagnosis not present

## 2018-12-13 DIAGNOSIS — L89309 Pressure ulcer of unspecified buttock, unspecified stage: Secondary | ICD-10-CM | POA: Diagnosis not present

## 2018-12-13 DIAGNOSIS — E860 Dehydration: Secondary | ICD-10-CM | POA: Diagnosis not present

## 2018-12-15 DIAGNOSIS — E860 Dehydration: Secondary | ICD-10-CM | POA: Diagnosis not present

## 2018-12-15 DIAGNOSIS — L89309 Pressure ulcer of unspecified buttock, unspecified stage: Secondary | ICD-10-CM | POA: Diagnosis not present

## 2018-12-15 DIAGNOSIS — E785 Hyperlipidemia, unspecified: Secondary | ICD-10-CM | POA: Diagnosis not present

## 2018-12-18 DIAGNOSIS — Z933 Colostomy status: Secondary | ICD-10-CM | POA: Diagnosis not present

## 2018-12-18 DIAGNOSIS — L89319 Pressure ulcer of right buttock, unspecified stage: Secondary | ICD-10-CM | POA: Diagnosis not present

## 2018-12-20 DIAGNOSIS — E860 Dehydration: Secondary | ICD-10-CM | POA: Diagnosis not present

## 2018-12-20 DIAGNOSIS — L89309 Pressure ulcer of unspecified buttock, unspecified stage: Secondary | ICD-10-CM | POA: Diagnosis not present

## 2018-12-20 DIAGNOSIS — T82594D Other mechanical complication of infusion catheter, subsequent encounter: Secondary | ICD-10-CM | POA: Diagnosis not present

## 2018-12-20 DIAGNOSIS — E785 Hyperlipidemia, unspecified: Secondary | ICD-10-CM | POA: Diagnosis not present

## 2018-12-20 DIAGNOSIS — T83592A Infection and inflammatory reaction due to indwelling ureteral stent, initial encounter: Secondary | ICD-10-CM | POA: Diagnosis not present

## 2018-12-21 ENCOUNTER — Ambulatory Visit (INDEPENDENT_AMBULATORY_CARE_PROVIDER_SITE_OTHER): Payer: BLUE CROSS/BLUE SHIELD | Admitting: Family Medicine

## 2018-12-21 ENCOUNTER — Other Ambulatory Visit: Payer: Self-pay

## 2018-12-21 ENCOUNTER — Encounter (INDEPENDENT_AMBULATORY_CARE_PROVIDER_SITE_OTHER): Payer: Self-pay | Admitting: Family Medicine

## 2018-12-21 DIAGNOSIS — E669 Obesity, unspecified: Secondary | ICD-10-CM

## 2018-12-21 DIAGNOSIS — Z6834 Body mass index (BMI) 34.0-34.9, adult: Secondary | ICD-10-CM | POA: Diagnosis not present

## 2018-12-21 DIAGNOSIS — E1122 Type 2 diabetes mellitus with diabetic chronic kidney disease: Secondary | ICD-10-CM

## 2018-12-21 DIAGNOSIS — N184 Chronic kidney disease, stage 4 (severe): Secondary | ICD-10-CM | POA: Diagnosis not present

## 2018-12-21 NOTE — Progress Notes (Signed)
Office: [phone]  /  Fax: [phone] TeleHealth Visit:  [name] has verbally consented to this TeleHealth visit today. The patient is located at home, the provider is located at the [facility] office. The participants in this visit include the listed provider and patient. The visit was conducted today via FaceTime.  HPI:   Chief Complaint: OBESITY [name] is here to discuss her progress with her obesity treatment plan. She is keeping a food journal with 1200-1300 calories and 75+ grams of protein and is following her eating plan approximately 90% of the time. She states she is exercising 0 minutes 0 times per week. [name] has been doing better on the plan over the past week. Polyphagia has decreased. She has had what she thinks is food poisoning over the past 3-4 days and states meat has not settled well on her stomach. She has been journaling while ill and is getting 75 grams of protein daily.  We were unable to weigh the patient today for this TeleHealth visit. She feels as if she has gained 2 lbs since her last visit. She has lost 18 lbs since starting treatment with [place].  Diabetes Mellitus [name] has a diagnosis of diabetes mellitus. It is well controlled. She is  on Januvia. [name] states fasting blood sugars have been running 70 to 90's and 80-120 postprandial. She does report hypoglycemic episodes. She has an Endocrinology appointment 02/08/2019. Last A1c was 6.0 in Care Everywhere on 10/13/18. She has been working on intensive lifestyle modifications including diet, exercise, and weight loss to help control her blood glucose levels.  ASSESSMENT AND PLAN:  Type 2 diabetes mellitus without complication, without long-term current use of insulin (HCC)  Class 1 obesity with serious comorbidity and body mass index (BMI) of 34.0 to 34.9 in adult, unspecified obesity type  PLAN:  Diabetes Mellitus [name] has been given extensive diabetes education by myself today  including ideal fasting and post-prandial blood glucose readings, individual ideal Hgb A1c goals  and hypoglycemia prevention. We discussed the importance of good blood sugar control to decrease the likelihood of diabetic complications such as nephropathy, neuropathy, limb loss, blindness, coronary artery disease, and death. We discussed the importance of intensive lifestyle modification including diet, exercise and weight loss as the first line treatment for diabetes. [name] will monitor for hypoglycemia and discuss with Dr. Leafy Ro at her next visit in 2 weeks. Hypoglycemia may be from her recent illness. She agrees to continue Januvia and will follow-up at the agreed upon time.  I spent > than 50% of the 15 minute visit on counseling as documented in the note.  Obesity [name] is currently in the action stage of change. As such, her goal is to continue with weight loss efforts. She has agreed to keep a food journal with 1200-1300 calories and 75 grams of protein daily. [name] has not been prescribed exercise at this time. We discussed the following Behavioral Modification Strategies today: increasing lean protein intake, planning for success, and keep a strict food journal.  [name] has agreed to follow-up with our clinic in 2 weeks. She was informed of the importance of frequent follow-up visits to maximize her success with intensive lifestyle modifications for her multiple health conditions.  ALLERGIES: Allergies  Allergen Reactions  . Penicillins Swelling    Facial swelling/childhood allergy Has patient had a PCN reaction causing immediate rash, facial/tongue/throat swelling, SOB or lightheadedness with hypotension: Yes Has patient had a PCN reaction causing severe rash involving mucus membranes  or skin necrosis: Yes Has patient had a PCN reaction that required hospitalization yes Has patient had a PCN reaction occurring within the last 10 years: No If all of the above answers are "NO", then  may proceed with Cephalosporin use.   . Cefaclor Rash    Ceclor  . Erythromycin Other (See Comments)    Gastritis, abd cramps  . Tape Rash    blisters  . Trimethoprim Rash  . Ultram [Tramadol] Hives  . Cephalosporins Rash  . Fluconazole Rash  . Oxycodone Other (See Comments)    " I just feel weird"  . Pectin Rash    Pectin ring for stoma  . Septra [Sulfamethoxazole-Trimethoprim] Rash  . Sulfa Antibiotics Rash    MEDICATIONS: Current Outpatient Medications on File Prior to Visit  Medication Sig Dispense Refill  . acetaminophen (TYLENOL) 325 MG tablet Take 2 tablets (650 mg total) by mouth every 6 (six) hours as needed. 30 tablet 1  . anastrozole (ARIMIDEX) 1 MG tablet TAKE 1 TABLET DAILY (Patient taking differently: Take 1 mg by mouth daily. ) 90 tablet 3  . Biotin 5 MG TABS Take 5 mg by mouth every morning.     [name] Kitchen buPROPion (WELLBUTRIN SR) 200 MG 12 hr tablet Take 1 tablet (200 mg total) by mouth daily. 30 tablet 0  . Cholecalciferol (VITAMIN D3) 10000 UNITS capsule Take 10,000 Units by mouth once a week. Sunday evening's    . clobetasol (OLUX) 0.05 % topical foam Apply topically 2 (two) times daily.    . diphenhydrAMINE (BENADRYL) 25 mg capsule Take 1 capsule (25 mg total) by mouth every 8 (eight) hours as needed for itching, allergies or sleep. 30 capsule 0  . diphenoxylate-atropine (LOMOTIL) 2.5-0.025 MG tablet Take 1 tablet by mouth 4 (four) times daily as needed for diarrhea or loose stools. TO PREVENT LOOSE BOWEL MOVEMENTS (Patient not taking: Reported on 11/07/2018) 30 tablet 0  . fentaNYL (DURAGESIC) 50 MCG/HR Place 1 patch onto the skin every 3 (three) days. 10 patch 0  . ferrous sulfate 325 (65 FE) MG tablet Take 1 tablet (325 mg total) by mouth at bedtime. 30 tablet 3  . filgrastim-sndz (ZARXIO) 480 MCG/0.8ML SOSY injection Inject 0.8 mLs (480 mcg total) into the skin as directed. Inject 480 mcg every 5 days 0.8 mL 99  . GRANIX 480 MCG/0.8ML SOSY injection Inject 480 mcg  every 5 days indefinitely 0.8 mL 0  . HYDROcodone-acetaminophen (NORCO/VICODIN) 5-325 MG tablet Take 2 tablets by mouth every 6 (six) hours as needed for moderate pain.    [name] Kitchen JANUVIA 50 MG tablet Take 50 mg by mouth daily.     [name] Kitchen levothyroxine (SYNTHROID, LEVOTHROID) 150 MCG tablet Take 1 tablet (150 mcg total) by mouth daily before breakfast. 30 tablet 1  . loratadine (CLARITIN) 10 MG tablet Take 10 mg by mouth every morning.     . nitrofurantoin, macrocrystal-monohydrate, (MACROBID) 100 MG capsule Take 100 mg by mouth at bedtime.    [name] Kitchen omega-3 acid ethyl esters (LOVAZA) 1 G capsule Take 1 g by mouth 2 (two) times daily.     [name] Kitchen oxybutynin (DITROPAN) 5 MG tablet     . Polyethyl Glycol-Propyl Glycol (SYSTANE OP) Place 1 drop into both eyes daily as needed (dry eyes).     . pregabalin (LYRICA) 50 MG capsule TAKE 1 CAPSULE(50 MG) BY MOUTH TWICE DAILY 180 capsule 3  . Prenatal Vit-Fe Fumarate-FA (PRENATAL VITAMIN PO) Take 1 capsule by mouth daily. Takes prenatal because there are no  dyes in it    . rosuvastatin (CRESTOR) 10 MG tablet Take 10 mg by mouth every evening.     . Saccharomyces boulardii (FLORASTOR PO) Take 1 capsule by mouth daily.    . sodium bicarbonate 650 MG tablet Take 650 mg by mouth 2 (two) times daily.    . sodium chloride 0.9 % injection Inject 10 mLs into the vein daily.     No current facility-administered medications on file prior to visit.     PAST MEDICAL HISTORY: Past Medical History:  Diagnosis Date  . Adrenal adenoma, left 02/08/2016   CT: stable benign  . Anemia in neoplastic disease   . Back pain   . Benign essential HTN   . 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 Cancer of corpus uteri, except isthmus [facility])  oncologist-- dr Denman [name] and dr Alvy Bimler    10-15-2004  dx endometroid  endometrial and ovarian cancer s/p  chemotheapy and surgery(TAH w/ BSO) :  recurrent 11-19-2014 post pelvic surgery and radiation 01-29-2015 to 03-10-2015  . Chronic idiopathic neutropenia (HCC)    presumed related to chemotherapy March 2006--- followed by dr Alvy Bimler (treatment w/ G-CSF injections  . Chronic nausea   . Chronic pain    perineal/ anal  area from bladder pad irritates skin , right flank pain  . CKD stage G2/A3, GFR 60-89 and albumin creatinine ratio >300 mg/g    nephrologist-  dr Madelon Lips  . Colovesical fistula   . Diabetic retinopathy, background ([place])   . Difficult intravenous access    small veins--- hx PICC lines  . DM type 2 (diabetes mellitus, type 2) ([place])    monitored by dr Legrand Como altheimer  . Dysuria   . Environmental and seasonal allergies   . Fatty liver 02/08/2016   CT  . Generalized muscle weakness   . GERD (gastroesophageal reflux disease)   . Hiatal hernia   . History of abdominal abscess 04/16/2017   post surgery 04-01-2017  --- resolved 10/ 2018  . History of gastric polyp    2014  duodenum  . History of ileus 04/16/2017   resolved w/ no surgical intervention  . History of radiation therapy    01-29-2015 to 03-10-2015  pelvis 50.4Gy  . Hypothyroidism    monitored by dr Legrand Como altheimer  . IBS (irritable bowel syndrome)   . Ileostomy in place [facility]) 04/01/2017   created at same time colostomy takedown.  . Joint pain   . Leg edema   . Lower urinary tract symptoms (LUTS)    urge urinary  incontinence  . Mixed dyslipidemia   . Multiple thyroid nodules    Managed by Dr. Harlow Asa  . Nephrostomy status ([place])   . Palpitations   . Pelvic abscess in female 04/16/2017  . PONV (postoperative nausea and vomiting)    "scopolamine patch works for me"  . Radiation-induced dermatitis    contact dermatitis , radiation completed, rash only on ankles now.  . Seasonal allergies   . Ureteral stricture, right UROLOGIT-  DR [facility]   CHRONIC--  TREATMENT  URETERAL STENT  . 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 Vitamin D deficiency   . Wears glasses     PAST SURGICAL HISTORY: Past Surgical History:  Procedure Laterality Date  . APPENDECTOMY    . biopsy thyroid nodules    .  BREAST RECONSTRUCTION WITH PLACEMENT OF TISSUE EXPANDER AND FLEX HD (ACELLULAR HYDRATED DERMIS) Bilateral 12/11/2015   Procedure: BILATERAL BREAST RECONSTRUCTION WITH PLACEMENT OF TISSUE EXPANDERS;  Surgeon: Irene Limbo, MD;  Location: [place];  Service: Plastics;  Laterality: Bilateral;  . COLONOSCOPY WITH PROPOFOL N/A 08/21/2013   Procedure: COLONOSCOPY WITH PROPOFOL;  Surgeon: Cleotis Nipper, MD;  [facility];  Service: Endoscopy;  Laterality: N/A;  . COLOSTOMY TAKEDOWN N/A 12/04/2014   Procedure: LAPROSCOPIC LYSIS OF ADHESIONS, SPLENIC MOBILIZATION, RELOCATION OF COLOSTOMY, DEBRIDEMENT INITIAL COLOSTOMY SITE;  Surgeon: Michael [name], MD;  [facility];  Service: General;  Laterality: N/A;  . CYSTOGRAM N/A 06/01/2017   Procedure: CYSTOGRAM;  Surgeon: Alexis Frock, MD;  [facility];  Service: Urology;  Laterality: N/A;  . CYSTOSCOPY W/ RETROGRADES Right 11/21/2015   Procedure: CYSTOSCOPY WITH RETROGRADE PYELOGRAM;  Surgeon: Alexis Frock, MD;  [facility];  Service: Urology;  Laterality: Right;  . CYSTOSCOPY W/ URETERAL STENT PLACEMENT Right 11/21/2015   Procedure: CYSTOSCOPY WITH STENT REPLACEMENT;  Surgeon: Alexis Frock, MD;  [facility];  Service: Urology;  Laterality: Right;  . CYSTOSCOPY W/ URETERAL STENT PLACEMENT Right 03/10/2016   Procedure: CYSTOSCOPY WITH STENT REPLACEMENT;  Surgeon: Alexis Frock, MD;  Location: [facility];  Service: Urology;  Laterality: Right;  . CYSTOSCOPY W/ URETERAL STENT PLACEMENT Right 06/30/2016   Procedure: CYSTOSCOPY WITH RETROGRADE PYELOGRAM/URETERAL STENT EXCHANGE;  Surgeon: Alexis Frock, MD;  Location: [facility];  Service: Urology;  Laterality: Right;  .  CYSTOSCOPY W/ URETERAL STENT PLACEMENT N/A 06/01/2017   Procedure: CYSTOSCOPY WITH EXAM UNDER ANESTHESIA;  Surgeon: Alexis Frock, MD;  [facility];  Service: Urology;  Laterality: N/A;  . CYSTOSCOPY W/ URETERAL STENT PLACEMENT Right 08/17/2017   Procedure: CYSTOSCOPY WITH RETROGRADE PYELOGRAM/URETERAL STENT REMOVAL;  Surgeon: Alexis Frock, MD;  Location: [facility];  Service: Urology;  Laterality: Right;  . CYSTOSCOPY WITH RETROGRADE PYELOGRAM, URETEROSCOPY AND STENT PLACEMENT Right 03/20/2015   Procedure: CYSTOSCOPY WITH RETROGRADE PYELOGRAM, URETEROSCOPY WITH BALLOON DILATION AND STENT PLACEMENT ON RIGHT;  Surgeon: Alexis Frock, MD;  Location: [facility];  Service: Urology;  Laterality: Right;  . CYSTOSCOPY WITH RETROGRADE PYELOGRAM, URETEROSCOPY AND STENT PLACEMENT Right 05/02/2015   Procedure: CYSTOSCOPY WITH RIGHT RETROGRADE PYELOGRAM,  DIAGNOSTIC URETEROSCOPY AND STENT PULL ;  Surgeon: Alexis Frock, MD;  Location: [facility];  Service: Urology;  Laterality: Right;  . CYSTOSCOPY WITH RETROGRADE PYELOGRAM, URETEROSCOPY AND STENT PLACEMENT Right 09/05/2015   Procedure: CYSTOSCOPY WITH RETROGRADE PYELOGRAM,  AND STENT PLACEMENT;  Surgeon: Alexis Frock, MD;  [facility];  Service: Urology;  Laterality: Right;  . CYSTOSCOPY WITH RETROGRADE PYELOGRAM, URETEROSCOPY AND STENT PLACEMENT Right 04/01/2017   Procedure: CYSTOSCOPY WITH RETROGRADE PYELOGRAM, URETEROSCOPY AND STENT PLACEMENT;  Surgeon: Alexis Frock, MD;  [facility];  Service: Urology;  Laterality: Right;  . CYSTOSCOPY WITH STENT PLACEMENT Right 10/27/2016   Procedure: CYSTOSCOPY WITH STENT CHANGE and right retrograde pyelogram;  Surgeon: Alexis Frock, MD;  Location: [facility];  Service: Urology;  Laterality: Right;  . EUS N/A 10/02/2014   Procedure: LOWER ENDOSCOPIC ULTRASOUND (EUS);  Surgeon: Arta Silence, MD;  Location: [facility];  Service:  Endoscopy;  Laterality: N/A;  . EXCISION SOFT TISSUE MASS RIGHT FOREMAN  12-08-2006  . EYE SURGERY  as child   pytosis of eyelids repair  . INCISION AND DRAINAGE OF WOUND Bilateral 12/26/2015   Procedure: DEBRIDEMENT OF BILATERAL MASTECTOMY FLAPS;  Surgeon: Irene Limbo,  MD;  Location: [place];  Service: Plastics;  Laterality: Bilateral;  . IR CV LINE INJECTION  05/31/2017  . IR FLUORO GUIDE CV LINE LEFT  05/31/2017  . IR FLUORO GUIDE CV LINE RIGHT  04/06/2017  . IR FLUORO GUIDE CV MIDLINE PICC RIGHT  05/30/2017  . IR NEPHROSTOGRAM LEFT INITIAL PLACEMENT  09/02/2017  . IR NEPHROSTOGRAM LEFT THRU EXISTING ACCESS  11/29/2017  . IR NEPHROSTOGRAM RIGHT INITIAL PLACEMENT  09/02/2017  . IR NEPHROSTOGRAM RIGHT THRU EXISTING ACCESS  09/13/2017  . IR NEPHROSTOGRAM RIGHT THRU EXISTING ACCESS  11/29/2017  . IR NEPHROSTOMY EXCHANGE LEFT  11/28/2017  . IR NEPHROSTOMY EXCHANGE LEFT  01/05/2018  . IR NEPHROSTOMY EXCHANGE LEFT  02/16/2018  . IR NEPHROSTOMY EXCHANGE LEFT  03/30/2018  . IR NEPHROSTOMY EXCHANGE LEFT  05/12/2018  . IR NEPHROSTOMY EXCHANGE LEFT  06/21/2018  . IR NEPHROSTOMY EXCHANGE LEFT  08/04/2018  . IR NEPHROSTOMY EXCHANGE LEFT  09/18/2018  . IR NEPHROSTOMY EXCHANGE LEFT  10/09/2018  . IR NEPHROSTOMY EXCHANGE LEFT  10/27/2018  . IR NEPHROSTOMY EXCHANGE LEFT  11/21/2018  . IR NEPHROSTOMY EXCHANGE RIGHT  10/02/2017  . IR NEPHROSTOMY EXCHANGE RIGHT  11/28/2017  . IR NEPHROSTOMY EXCHANGE RIGHT  01/05/2018  . IR NEPHROSTOMY EXCHANGE RIGHT  02/16/2018  . IR NEPHROSTOMY EXCHANGE RIGHT  03/30/2018  . IR NEPHROSTOMY EXCHANGE RIGHT  05/12/2018  . IR NEPHROSTOMY EXCHANGE RIGHT  06/21/2018  . IR NEPHROSTOMY EXCHANGE RIGHT  08/04/2018  . IR NEPHROSTOMY EXCHANGE RIGHT  09/18/2018  . IR NEPHROSTOMY EXCHANGE RIGHT  10/27/2018  . IR NEPHROSTOMY EXCHANGE RIGHT  11/21/2018  . IR NEPHROSTOMY PLACEMENT LEFT  10/02/2017  . IR RADIOLOGIST EVAL & MGMT  05/03/2017  . IR US GUIDE VASC ACCESS LEFT  05/31/2017  . IR US  GUIDE VASC ACCESS RIGHT  04/06/2017  . IR US GUIDE VASC ACCESS RIGHT  05/30/2017  . LAPAROSCOPIC CHOLECYSTECTOMY  1990  . LIPOSUCTION WITH LIPOFILLING Bilateral 04/16/2016   Procedure: LIPOSUCTION WITH LIPOFILLING TO BILATERAL CHEST;  Surgeon: Irene Limbo, MD;  Location: [place];  Service: Plastics;  Laterality: Bilateral;  . MASTECTOMY W/ SENTINEL NODE BIOPSY Bilateral 12/11/2015   Procedure: RIGHT PROPHYLACTIC MASTECTOMY, LEFT TOTAL MASTECTOMY WITH LEFT AXILLARY SENTINEL LYMPH NODE BIOPSY;  Surgeon: Stark Klein, MD;  Location: [place];  Service: General;  Laterality: Bilateral;  . OSTOMY N/A 11/19/2014   Procedure: OSTOMY;  Surgeon: Michael [name], MD;  [facility];  Service: General;  Laterality: N/A;  . PROCTOSCOPY N/A 04/01/2017   Procedure: RIDGE PROCTOSCOPY;  Surgeon: Michael [name], MD;  [facility];  Service: General;  Laterality: N/A;  . REMOVAL OF BILATERAL TISSUE EXPANDERS WITH PLACEMENT OF BILATERAL BREAST IMPLANTS Bilateral 04/16/2016   Procedure: REMOVAL OF BILATERAL TISSUE EXPANDERS WITH PLACEMENT OF BILATERAL BREAST IMPLANTS;  Surgeon: Irene Limbo, MD;  Location: [place];  Service: Plastics;  Laterality: Bilateral;  . ROBOTIC ASSISTED LAP VAGINAL HYSTERECTOMY N/A 11/19/2014   Procedure: ROBOTIC LYSIS OF ADHESIONS, CONVERTED TO LAPAROTOMY RADICAL UPPER VAGINECTOMY,LOW ANTERIOR BOWEL RESECTION, COLOSTOMY, BILATERAL URETERAL STENT PLACEMENT AND CYSTONOMY CLOSURE;  Surgeon: Everitt Amber, MD;  [facility];  Service: Gynecology;  Laterality: N/A;  . TISSUE EXPANDER FILLING Bilateral 12/26/2015   Procedure: EXPANSION OF BILATERAL CHEST TISSUE EXPANDERS (60 mL- Right; 75 mL- Left);  Surgeon: Irene Limbo, MD;  Location: [place];  Service: Plastics;  Laterality: Bilateral;  . TONSILLECTOMY    . TOTAL ABDOMINAL HYSTERECTOMY  March 2006  [facility]   and Bilateral Salpingoophorectomy/  staging for Ovarian cancer/  an  . XI  ROBOTIC ASSISTED LOWER ANTERIOR RESECTION N/A 04/01/2017   Procedure: XI ROBOTIC VS LAPAROSCOPIC COLOSTOMY TAKEDOWN WITH LYSIS OF ADHESIONS.;  Surgeon: Michael [name], MD;  [facility];  Service: General;  Laterality: N/A;  ERAS PATHWAY    SOCIAL HISTORY: Social History   Tobacco Use  . Smoking status: Never Smoker  . Smokeless tobacco: Never Used  Substance Use Topics  . Alcohol use: Not Currently  . Drug use: No    FAMILY HISTORY: Family History  Problem Relation Age of Onset  . Cancer Mother 27       stomach ca  . Hypertension Mother   . Cancer Father 61       prostate ca  . Diabetes Father   . Heart disease Father        CABG  . Hypertension Father   . Hyperlipidemia Father   . Obesity Father   . Breast cancer Maternal Aunt        dx in her 88s  . Lymphoma Paternal Aunt   . Brain cancer Paternal Grandfather   . Ovarian cancer Other   . Diabetes Sister   . Hypertension Brother y-10  . Heart disease Brother        CABG  . Diabetes Brother    ROS: Review of Systems  Endo/Heme/Allergies:       Positive for hypoglycemia.   PHYSICAL EXAM: Pt in no acute distress  RECENT LABS AND TESTS: BMET    Component Value Date/Time   NA 142 11/29/2018   NA 141 01/24/2017 1228   K 4.4 11/29/2018   K 4.6 01/24/2017 1228   CL 106 10/04/2018 1345   CO2 20 (L) 10/04/2018 1345   CO2 29 01/24/2017 1228   GLUCOSE 132 (H) 10/04/2018 1345   GLUCOSE 84 01/24/2017 1228   BUN 22 (A) 11/29/2018   BUN 22.2 01/24/2017 1228   CREATININE 1.3 (A) 11/29/2018   CREATININE 1.94 (H) 10/04/2018 1345   CREATININE 1.98 (H) 03/27/2018 1324   CREATININE 0.8 01/24/2017 1228   CALCIUM 9.3 10/04/2018 1345   CALCIUM 10.2 01/24/2017 1228   GFRNONAA 27 (L) 10/04/2018 1345   GFRNONAA 26 (L) 03/27/2018 1324   GFRNONAA 78 12/16/2014 1530   GFRAA 31 (L) 10/04/2018 1345   GFRAA 30 (L) 03/27/2018 1324   GFRAA >89 12/16/2014 1530   Lab Results  Component Value Date   HGBA1C 7.9 (H)  09/01/2017   HGBA1C 5.3 04/01/2017   HGBA1C 5.8 (H) 09/05/2015   HGBA1C 6.8 (H) 11/21/2014   HGBA1C 6.3 (H) 11/07/2014   No results found for: INSULIN CBC    Component Value Date/Time   WBC 4.0 11/29/2018   WBC 3.8 (L) 10/04/2018 1345   RBC 3.80 (L) 10/04/2018 1345   HGB 10.8 (A) 11/29/2018   HGB 10.2 (L) 03/27/2018 1324   HGB 12.4 07/29/2017 1444   HCT 35 (A) 11/29/2018   HCT 38.0 07/29/2017 1444   PLT 273 11/29/2018   PLT 268 03/27/2018 1324   PLT 260 07/29/2017 1444   MCV 96.6 10/04/2018 1345   MCV 90.9 07/29/2017 1444   MCH 29.5 10/04/2018 1345   MCHC 30.5 10/04/2018 1345   RDW 13.7 10/04/2018 1345   RDW 15.5 (H) 07/29/2017 1444   LYMPHSABS 0.7 10/04/2018 1345   LYMPHSABS 0.8 (L) 07/29/2017 1444   MONOABS 0.3 10/04/2018 1345   MONOABS 1.0 (H) 07/29/2017 1444  EOSABS 0.0 10/04/2018 1345   EOSABS 0.0 07/29/2017 1444   BASOSABS 0.1 10/04/2018 1345   BASOSABS 0.0 07/29/2017 1444   Iron/TIBC/Ferritin/ %Sat    Component Value Date/Time   IRON 7 (L) 08/31/2017 0451   IRON 12 (L) 11/29/2014 1251   TIBC 164 (L) 08/31/2017 0451   TIBC 331 11/29/2014 1251   FERRITIN 27 11/29/2014 1251   IRONPCTSAT 4 (L) 08/31/2017 0451   IRONPCTSAT 4 (L) 11/29/2014 1251   Lipid Panel     Component Value Date/Time   CHOL 109 08/28/2015 1617   TRIG 89 04/18/2017 0427   HDL 43.80 08/28/2015 1617   CHOLHDL 2 08/28/2015 1617   VLDL 19.8 08/28/2015 1617   LDLCALC 45 08/28/2015 1617   Hepatic Function Panel     Component Value Date/Time   PROT 8.4 (H) 10/04/2018 1345   PROT 6.9 01/24/2017 1228   ALBUMIN 3.0 (L) 10/04/2018 1345   ALBUMIN 3.4 (L) 01/24/2017 1228   AST 46 (A) 11/29/2018   AST 14 (L) 03/27/2018 1324   AST 16 01/24/2017 1228   ALT 70 (A) 11/29/2018   ALT 18 03/27/2018 1324   ALT 14 01/24/2017 1228   ALKPHOS 377 (A) 11/29/2018   ALKPHOS 90 01/24/2017 1228   BILITOT 0.5 10/04/2018 1345   BILITOT 0.2 (L) 03/27/2018 1324   BILITOT 0.34 01/24/2017 1228       Component Value Date/Time   TSH 2.408 08/30/2017 0623   TSH 0.63 08/28/2015 1617   Results for ISADORE, BOKHARI (MRN [id number]) as of 12/21/2018 14:07  Ref. Range 04/05/2018 11:45  Vitamin D, 25-Hydroxy Latest Ref Range: 30.0 - 100.0 ng/mL 40.8    I, Michaelene Song, am acting as Location manager for [employer], FNP-C.  I have reviewed the above documentation for accuracy and completeness, and I agree with the above.  - Clyde Upshaw, FNP-C.

## 2018-12-22 DIAGNOSIS — L89309 Pressure ulcer of unspecified buttock, unspecified stage: Secondary | ICD-10-CM | POA: Diagnosis not present

## 2018-12-22 DIAGNOSIS — E860 Dehydration: Secondary | ICD-10-CM | POA: Diagnosis not present

## 2018-12-22 DIAGNOSIS — E785 Hyperlipidemia, unspecified: Secondary | ICD-10-CM | POA: Diagnosis not present

## 2018-12-25 DIAGNOSIS — L89309 Pressure ulcer of unspecified buttock, unspecified stage: Secondary | ICD-10-CM | POA: Diagnosis not present

## 2018-12-25 DIAGNOSIS — E860 Dehydration: Secondary | ICD-10-CM | POA: Diagnosis not present

## 2018-12-25 DIAGNOSIS — E785 Hyperlipidemia, unspecified: Secondary | ICD-10-CM | POA: Diagnosis not present

## 2018-12-26 ENCOUNTER — Encounter (INDEPENDENT_AMBULATORY_CARE_PROVIDER_SITE_OTHER): Payer: Self-pay | Admitting: Family Medicine

## 2018-12-27 DIAGNOSIS — E785 Hyperlipidemia, unspecified: Secondary | ICD-10-CM | POA: Diagnosis not present

## 2018-12-27 DIAGNOSIS — E860 Dehydration: Secondary | ICD-10-CM | POA: Diagnosis not present

## 2018-12-27 DIAGNOSIS — L89309 Pressure ulcer of unspecified buttock, unspecified stage: Secondary | ICD-10-CM | POA: Diagnosis not present

## 2018-12-27 DIAGNOSIS — T83592A Infection and inflammatory reaction due to indwelling ureteral stent, initial encounter: Secondary | ICD-10-CM | POA: Diagnosis not present

## 2018-12-27 DIAGNOSIS — T82594D Other mechanical complication of infusion catheter, subsequent encounter: Secondary | ICD-10-CM | POA: Diagnosis not present

## 2018-12-27 IMAGING — XA IR EXCHANGE NEPHROSTOMY RIGHT
1 series · 6 of 6 positions shown · non-contrast
Comparison: Bilateral percutaneous nephrostomy catheter
exchange-11/28/2017;

INDICATION: History of endometrial cancer complicated bilateral ureteral
obstructions post placement of bilateral percutaneous nephrostomy
catheters on 09/02/2017.
TECHNIQUE: Informed written consent was obtained from the patient after a
discussion of the risks, benefits and alternatives to treatment.
Questions regarding the procedure were encouraged and answered. A
timeout was performed prior to the initiation of the procedure.

[Series 300: ir nephrostogram left thru existing acce · 6 of 6 slices shown]
[im 1/6]
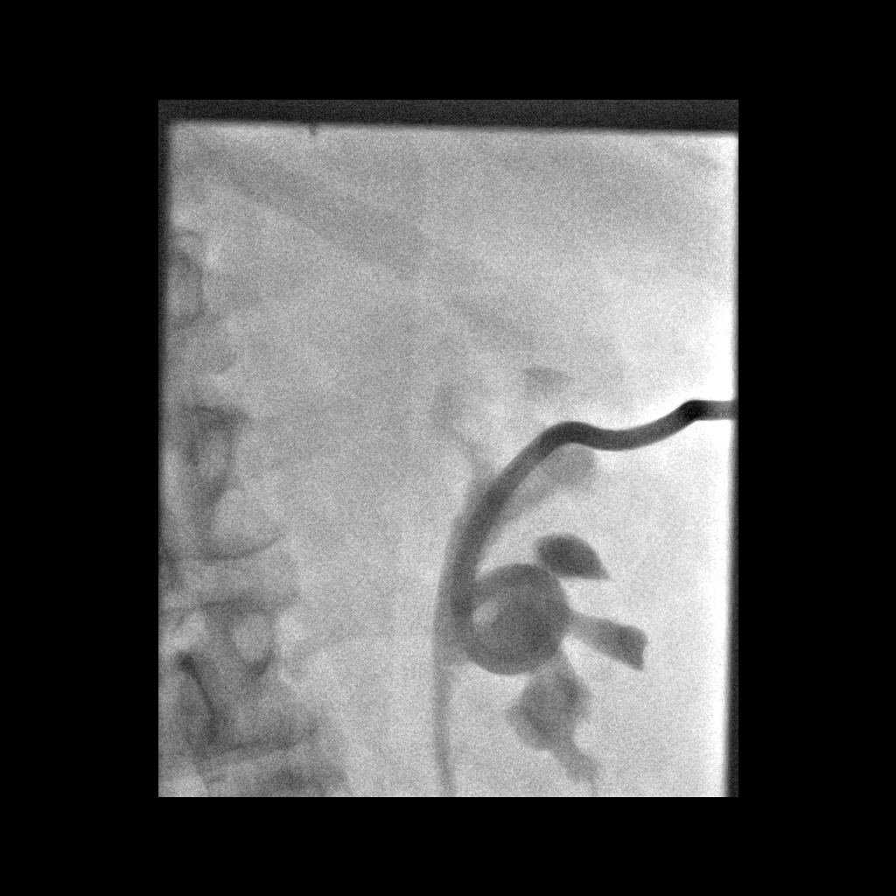
[im 2/6]
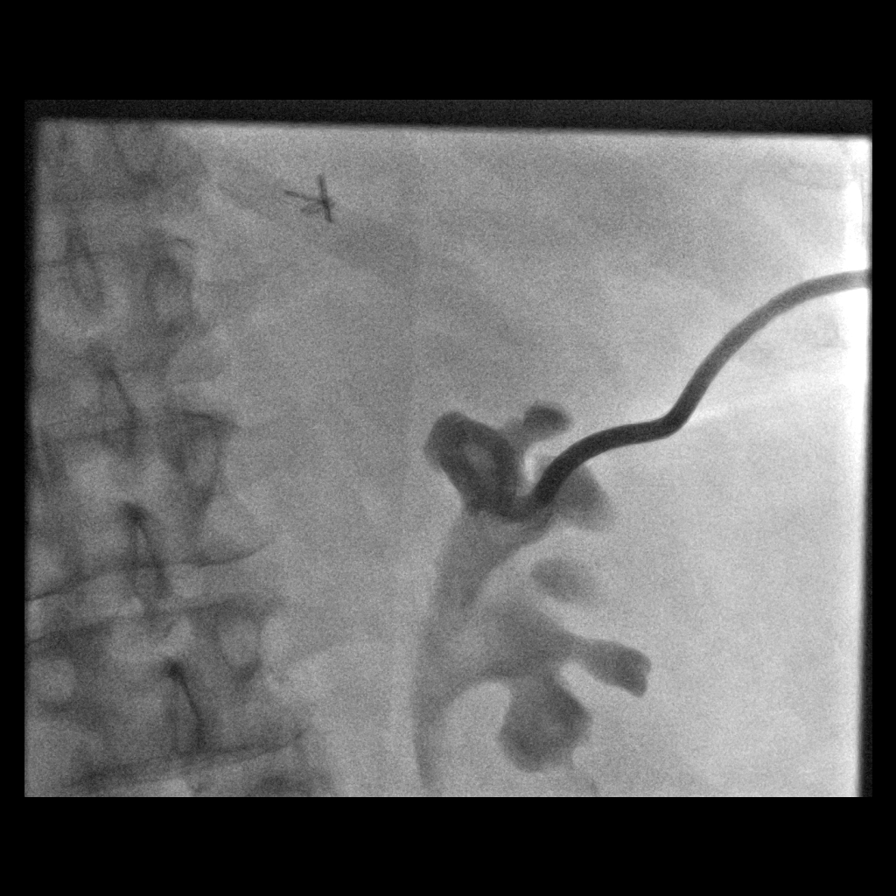
[im 3/6]
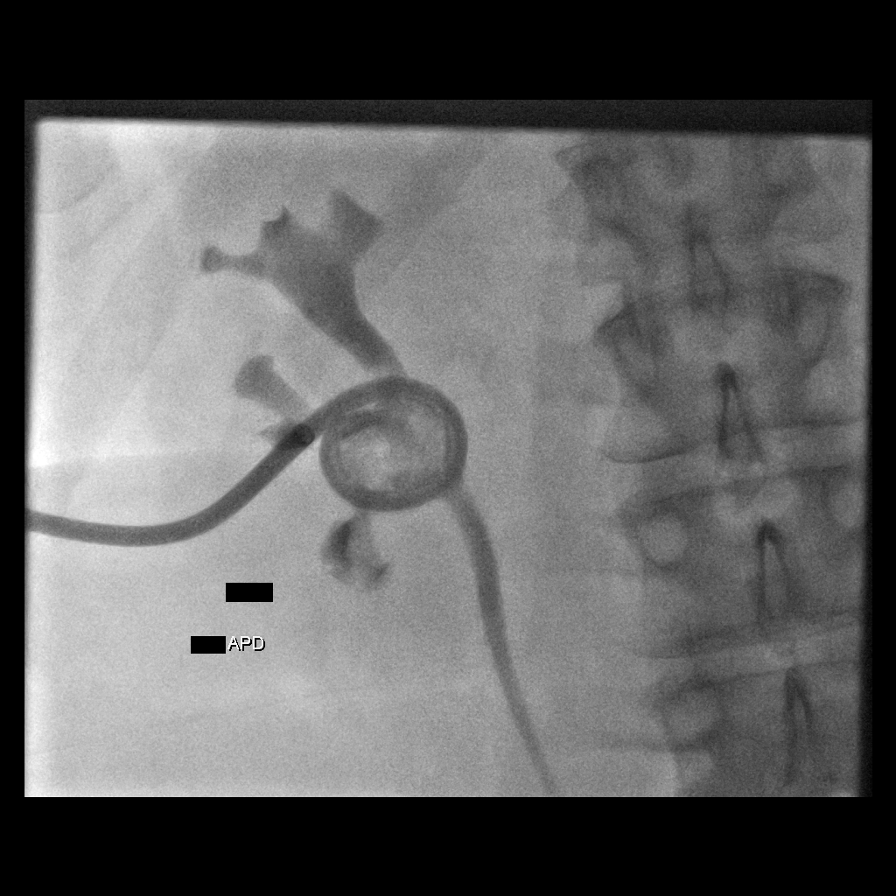
[im 4/6]
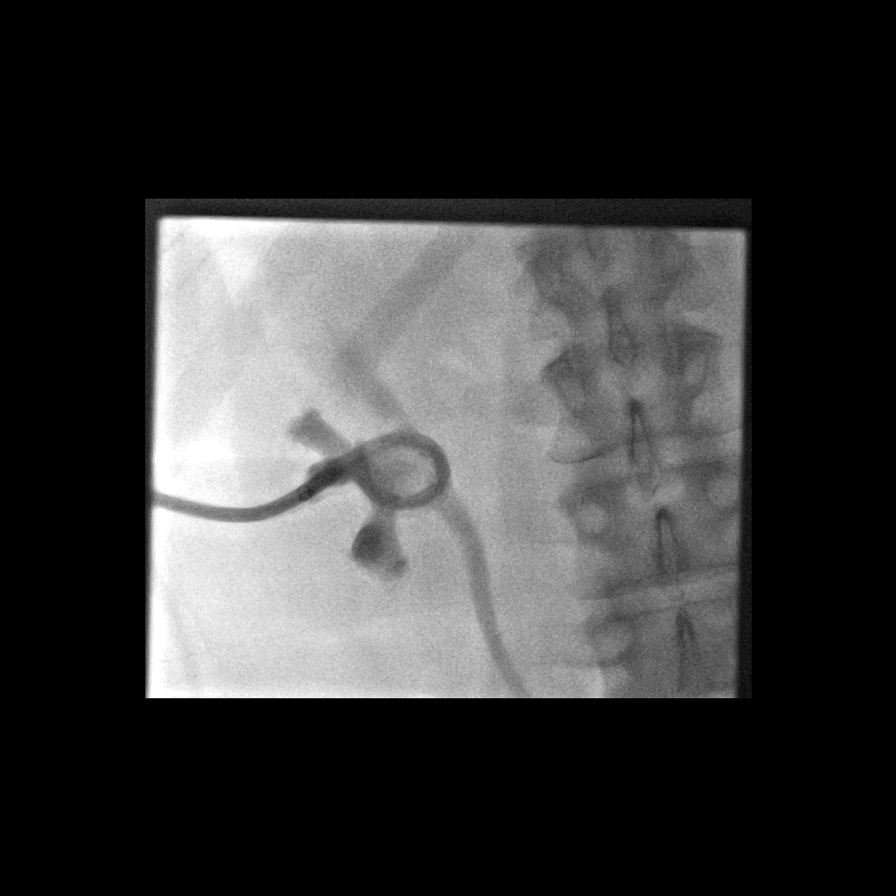
[im 5/6]
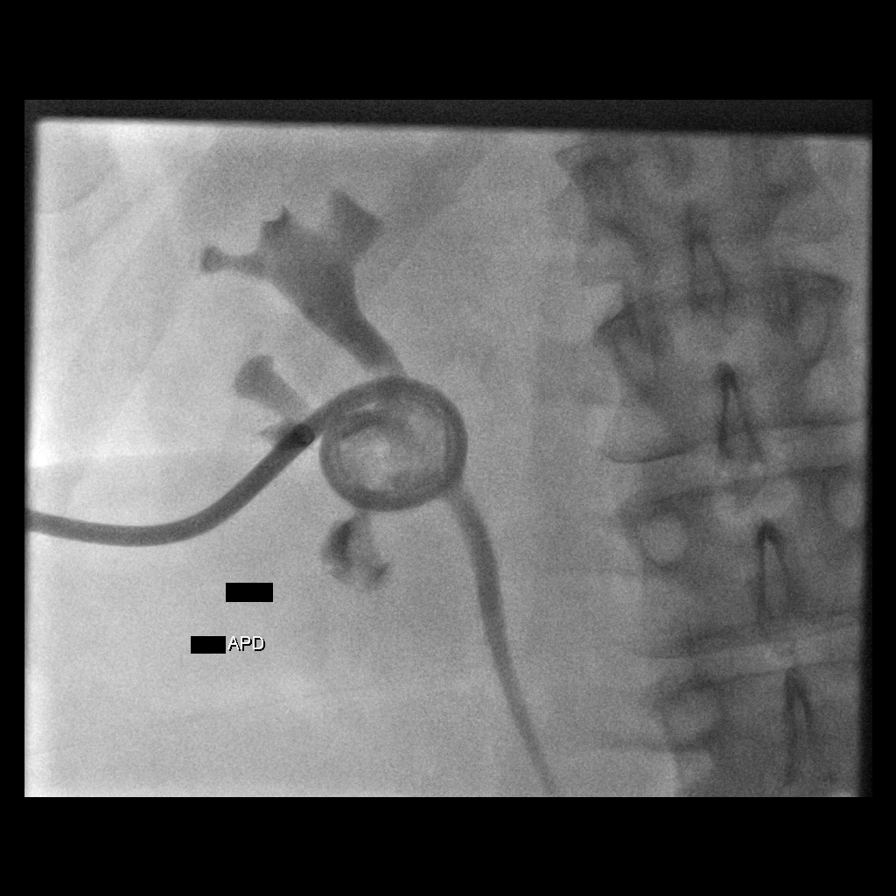
[im 6/6]
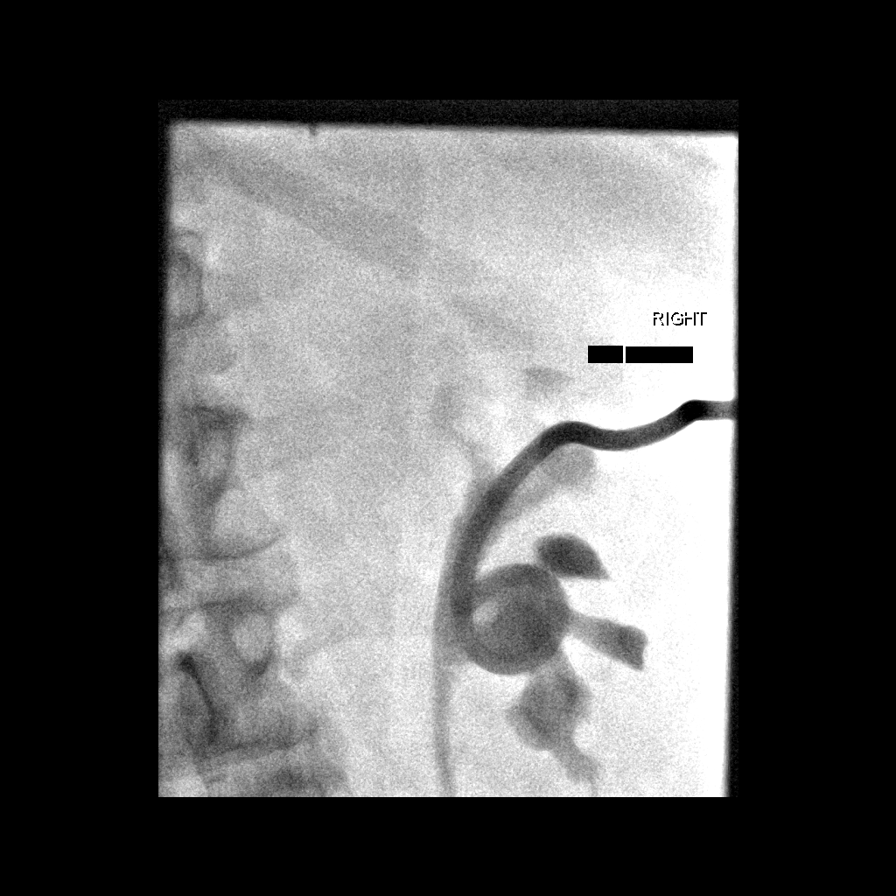

[6 of 6 positions shown; findings below may reference images not displayed]

Patient has tolerated her bilateral percutaneous nephrostomy
catheters poorly, currently complaining pain associated with the
left-sided nephrostomy catheter at this time attributable to the
retention suture.

Note, patient has a regular left sided 12 French nephrostomy
catheter and a right-sided 12 Laaouina Tiger catheter.

Patient presents now for bilateral percutaneous nephrostomy catheter
exchange.

EXAM:
FLUOROSCOPIC GUIDED BILATERAL SIDED NEPHROSTOMY CATHETER EXCHANGE
bilateral antegrade nephrostogram-11/29/2017

CONTRAST:  A total of 20 mL [id number] administered was administered
into both collecting systems

FLUOROSCOPY TIME:  2 minutes, 30 seconds (89.7 mGy)

COMPLICATIONS:
None immediate.
The bilateral flanks and external portions of existing nephrostomy
catheters were prepped and draped in the usual sterile fashion. A
sterile drape was applied covering the operative field. Maximum
barrier sterile technique with sterile gowns and gloves were used
for the procedure. A timeout was performed prior to the initiation
of the procedure.

A pre procedural spot fluoroscopic image was obtained. Beginning
with the left-sided nephrostomy, a small amount of contrast was
injected via the existing left-sided nephrostomy catheter
demonstrating appropriate positioning within the renal pelvis. The
existing nephrostomy catheter was cut and cannulated with a Benson
wire which was coiled within the renal pelvis. Under intermittent
fluoroscopic guidance, the existing nephrostomy catheter was
exchanged for a new 12 Laaouina Tiger drainage catheter.
Limited contrast injection confirmed appropriate positioning within
the left renal pelvis and a post exchange fluoroscopic image was
obtained. The catheter was locked, secured to the skin with an
interrupted suture and reconnected to a gravity bag.

The identical repeat procedure was repeated for the contralateral
right-sided nephrostomy, ultimately allowing successful exchange of
a new 12 French all-purpose drainage catheter with end coiled and
locked within the right renal pelvis.

Dressings were placed. The patient tolerated the above procedures
well without immediate postprocedural complication.
FINDINGS: The existing nephrostomy catheters are appropriately functioning,
however the right-sided percutaneous nephrostomy catheter was noted
to be slightly retracted coiled within the superior pole the right
renal collecting system..

After successful fluoroscopic guided exchange, the left-sided 12
French all-purpose drainage catheter is coiled and locked within the
left renal pelvis while the right-sided 12 Laaouina Tiger
catheter is coiled and locked within the right renal pelvis.
IMPRESSION: 1. Successful fluoroscopic guided exchange of left-sided 12 French
percutaneous nephrostomy catheter.
2. Successful fluoroscopic guided exchange of right-sided 12 Eisele
Rowena Reina catheter.

## 2018-12-28 ENCOUNTER — Telehealth: Payer: Self-pay | Admitting: *Deleted

## 2018-12-28 ENCOUNTER — Other Ambulatory Visit: Payer: Self-pay | Admitting: Gynecologic Oncology

## 2018-12-28 DIAGNOSIS — R1030 Lower abdominal pain, unspecified: Secondary | ICD-10-CM

## 2018-12-28 DIAGNOSIS — Z8543 Personal history of malignant neoplasm of ovary: Secondary | ICD-10-CM

## 2018-12-28 NOTE — Telephone Encounter (Signed)
Patient called and stated "I am still bleeding since I saw Dr. Denman [name] last time in December. It changed to just spotting for a while then went away for two days after two rounds of antibiotics. 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 I have been having a light to medium flow bleeding for at least five days, probably more. I have some odor, and the color is sometimes bright read to dark brown blood. I am having pulling/cramping feeling in my abdomen to the right side. Its' in the upper right RLQ area, where my cancer was before. 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 Example, I got up yesterday morning and got cleaned up for the home health nurse at 830am, by the time she arrived at 930am I needed to change my depends. Is there anything that Dr. Denman [name] needs to order before I come to see her on 6/5." Explained that I will forwarded the message to [facility] APP and Dr. Denman [name]; will call her back.

## 2018-12-28 NOTE — Progress Notes (Signed)
Ct AP ordered per Dr. Denman [name] prior to appt on June 5 to evaluate abdominal pain and increased vaginal bleeding. Evaluate for cause/recurrence.

## 2018-12-29 ENCOUNTER — Telehealth: Payer: Self-pay | Admitting: *Deleted

## 2018-12-29 DIAGNOSIS — E860 Dehydration: Secondary | ICD-10-CM | POA: Diagnosis not present

## 2018-12-29 DIAGNOSIS — E785 Hyperlipidemia, unspecified: Secondary | ICD-10-CM | POA: Diagnosis not present

## 2018-12-29 DIAGNOSIS — L89309 Pressure ulcer of unspecified buttock, unspecified stage: Secondary | ICD-10-CM | POA: Diagnosis not present

## 2018-12-29 NOTE — Telephone Encounter (Signed)
Called the patient back and explained that we can use the labs from 5/27 per CT department. Her husband will come today to pick up the contrast. Contrast is at the front desk

## 2018-12-29 NOTE — Telephone Encounter (Signed)
Called and spoke with the patient, gave the appt date/time for the CT scan with instructions. Patient had labs drawn from home health on 5/27, she is having the results fax to the office

## 2019-01-01 ENCOUNTER — Telehealth: Payer: Self-pay

## 2019-01-01 ENCOUNTER — Encounter: Payer: Self-pay | Admitting: Internal Medicine

## 2019-01-01 DIAGNOSIS — T82594D Other mechanical complication of infusion catheter, subsequent encounter: Secondary | ICD-10-CM | POA: Diagnosis not present

## 2019-01-01 DIAGNOSIS — K56609 Unspecified intestinal obstruction, unspecified as to partial versus complete obstruction: Secondary | ICD-10-CM

## 2019-01-01 DIAGNOSIS — T83592A Infection and inflammatory reaction due to indwelling ureteral stent, initial encounter: Secondary | ICD-10-CM | POA: Diagnosis not present

## 2019-01-01 DIAGNOSIS — Z8719 Personal history of other diseases of the digestive system: Secondary | ICD-10-CM

## 2019-01-01 HISTORY — DX: Unspecified intestinal obstruction, unspecified as to partial versus complete obstruction: K56.609

## 2019-01-01 HISTORY — DX: Personal history of other diseases of the digestive system: Z87.19

## 2019-01-01 NOTE — Telephone Encounter (Signed)
Where would you like the nurse to obtain urine specimen? Patient has Foley and nephrostomy tubes.  Please advise.

## 2019-01-01 NOTE — Telephone Encounter (Signed)
Please ask them to get specimens from both nephrostomy tubes and obtain 2 cultures.

## 2019-01-01 NOTE — Telephone Encounter (Signed)
Patient is calling with complaints of UTI  fever 102.8 and 100.2 , with increased sediment in nephrostomy tubes and occasional blood. She does not feel well and has foul odor in urine x 3 days .  Home health nurse coming on tomorrow and patient would like to submit a specimen.   [place], [place] [facility]   Laverle Patter, RN

## 2019-01-01 NOTE — Telephone Encounter (Signed)
Yes, please give an order for a urinalysis and urine culture.

## 2019-01-02 ENCOUNTER — Other Ambulatory Visit: Payer: Self-pay

## 2019-01-02 ENCOUNTER — Ambulatory Visit (HOSPITAL_COMMUNITY)
Admission: RE | Admit: 2019-01-02 | Discharge: 2019-01-02 | Disposition: A | Discharge status: Home / Self Care | Payer: BLUE CROSS/BLUE SHIELD | Source: Ambulatory Visit | Attending: Gynecologic Oncology | Admitting: Gynecologic Oncology

## 2019-01-02 ENCOUNTER — Ambulatory Visit: Payer: Self-pay | Admitting: Gynecologic Oncology

## 2019-01-02 DIAGNOSIS — C541 Malignant neoplasm of endometrium: Secondary | ICD-10-CM | POA: Diagnosis not present

## 2019-01-02 DIAGNOSIS — R1030 Lower abdominal pain, unspecified: Secondary | ICD-10-CM | POA: Insufficient documentation

## 2019-01-02 DIAGNOSIS — Z8543 Personal history of malignant neoplasm of ovary: Secondary | ICD-10-CM | POA: Insufficient documentation

## 2019-01-02 DIAGNOSIS — N1330 Unspecified hydronephrosis: Secondary | ICD-10-CM | POA: Diagnosis not present

## 2019-01-02 DIAGNOSIS — C50912 Malignant neoplasm of unspecified site of left female breast: Secondary | ICD-10-CM | POA: Diagnosis not present

## 2019-01-02 MED ORDER — SODIUM CHLORIDE (PF) 0.9 % IJ SOLN
INTRAMUSCULAR | Status: AC
  Filled 2019-01-02: qty 50

## 2019-01-02 MED ORDER — IOHEXOL 300 MG/ML  SOLN
75.0000 mL | Freq: Once | INTRAMUSCULAR | Status: AC | PRN
  Administered 2019-01-02: 75 mL via INTRAVENOUS

## 2019-01-03 ENCOUNTER — Telehealth: Payer: Self-pay | Admitting: *Deleted

## 2019-01-03 ENCOUNTER — Ambulatory Visit (INDEPENDENT_AMBULATORY_CARE_PROVIDER_SITE_OTHER): Payer: BLUE CROSS/BLUE SHIELD | Admitting: Family Medicine

## 2019-01-03 DIAGNOSIS — T83592A Infection and inflammatory reaction due to indwelling ureteral stent, initial encounter: Secondary | ICD-10-CM | POA: Diagnosis not present

## 2019-01-03 DIAGNOSIS — L89319 Pressure ulcer of right buttock, unspecified stage: Secondary | ICD-10-CM | POA: Diagnosis not present

## 2019-01-03 DIAGNOSIS — T82594D Other mechanical complication of infusion catheter, subsequent encounter: Secondary | ICD-10-CM | POA: Diagnosis not present

## 2019-01-03 DIAGNOSIS — E785 Hyperlipidemia, unspecified: Secondary | ICD-10-CM | POA: Diagnosis not present

## 2019-01-03 DIAGNOSIS — Z933 Colostomy status: Secondary | ICD-10-CM | POA: Diagnosis not present

## 2019-01-03 DIAGNOSIS — E860 Dehydration: Secondary | ICD-10-CM | POA: Diagnosis not present

## 2019-01-03 DIAGNOSIS — L89309 Pressure ulcer of unspecified buttock, unspecified stage: Secondary | ICD-10-CM | POA: Diagnosis not present

## 2019-01-03 NOTE — Telephone Encounter (Signed)
Patient called for her CT results from yesterday. Explained to the patient per [facility] APP that "the results are done, but Dr. Denman [name] hasn't reviewed the results yet. Dr. Denman [name] will review the results with her at her appt on Friday." Patient stated that "I was hoping not to wait that long, but if I have to I understand."

## 2019-01-04 ENCOUNTER — Other Ambulatory Visit: Payer: Self-pay | Admitting: Radiology

## 2019-01-04 ENCOUNTER — Encounter: Payer: Self-pay | Admitting: Family Medicine

## 2019-01-04 ENCOUNTER — Telehealth: Payer: Self-pay | Admitting: Gynecologic Oncology

## 2019-01-04 ENCOUNTER — Encounter (INDEPENDENT_AMBULATORY_CARE_PROVIDER_SITE_OTHER): Payer: Self-pay | Admitting: Family Medicine

## 2019-01-04 ENCOUNTER — Ambulatory Visit (INDEPENDENT_AMBULATORY_CARE_PROVIDER_SITE_OTHER): Payer: BLUE CROSS/BLUE SHIELD | Admitting: Family Medicine

## 2019-01-04 ENCOUNTER — Other Ambulatory Visit: Payer: Self-pay

## 2019-01-04 DIAGNOSIS — Z6834 Body mass index (BMI) 34.0-34.9, adult: Secondary | ICD-10-CM

## 2019-01-04 DIAGNOSIS — E669 Obesity, unspecified: Secondary | ICD-10-CM | POA: Diagnosis not present

## 2019-01-04 DIAGNOSIS — F3289 Other specified depressive episodes: Secondary | ICD-10-CM | POA: Diagnosis not present

## 2019-01-04 NOTE — Telephone Encounter (Signed)
Called patient and informed her that her CT has been released in [place]. Patient has an appt tomorrow in the office.

## 2019-01-05 ENCOUNTER — Other Ambulatory Visit (HOSPITAL_COMMUNITY): Payer: Self-pay | Admitting: Diagnostic Radiology

## 2019-01-05 ENCOUNTER — Encounter (HOSPITAL_COMMUNITY): Payer: Self-pay | Admitting: Interventional Radiology

## 2019-01-05 ENCOUNTER — Ambulatory Visit (HOSPITAL_COMMUNITY)
Admission: RE | Admit: 2019-01-05 | Discharge: 2019-01-05 | Disposition: A | Discharge status: Home / Self Care | Payer: BLUE CROSS/BLUE SHIELD | Source: Ambulatory Visit | Attending: Diagnostic Radiology | Admitting: Diagnostic Radiology

## 2019-01-05 ENCOUNTER — Inpatient Hospital Stay: Payer: BC Managed Care – PPO | Attending: Gynecologic Oncology | Admitting: Gynecologic Oncology

## 2019-01-05 ENCOUNTER — Other Ambulatory Visit: Payer: Self-pay

## 2019-01-05 ENCOUNTER — Other Ambulatory Visit: Payer: Self-pay | Admitting: Radiology

## 2019-01-05 ENCOUNTER — Ambulatory Visit (HOSPITAL_COMMUNITY)
Admission: RE | Admit: 2019-01-05 | Discharge: 2019-01-05 | Disposition: A | Discharge status: Home / Self Care | Payer: BLUE CROSS/BLUE SHIELD | Source: Ambulatory Visit | Attending: Urology | Admitting: Urology

## 2019-01-05 ENCOUNTER — Other Ambulatory Visit (HOSPITAL_COMMUNITY): Payer: Self-pay | Admitting: Interventional Radiology

## 2019-01-05 VITALS — BP 112/56 | HR 79 | Temp 98.0°F | Resp 18 | Ht 62.0 in

## 2019-01-05 DIAGNOSIS — Z90722 Acquired absence of ovaries, bilateral: Secondary | ICD-10-CM | POA: Insufficient documentation

## 2019-01-05 DIAGNOSIS — C541 Malignant neoplasm of endometrium: Secondary | ICD-10-CM

## 2019-01-05 DIAGNOSIS — C569 Malignant neoplasm of unspecified ovary: Secondary | ICD-10-CM | POA: Insufficient documentation

## 2019-01-05 DIAGNOSIS — C55 Malignant neoplasm of uterus, part unspecified: Secondary | ICD-10-CM

## 2019-01-05 DIAGNOSIS — Z853 Personal history of malignant neoplasm of breast: Secondary | ICD-10-CM | POA: Diagnosis not present

## 2019-01-05 DIAGNOSIS — L89309 Pressure ulcer of unspecified buttock, unspecified stage: Secondary | ICD-10-CM | POA: Diagnosis not present

## 2019-01-05 DIAGNOSIS — E860 Dehydration: Secondary | ICD-10-CM | POA: Diagnosis not present

## 2019-01-05 DIAGNOSIS — E785 Hyperlipidemia, unspecified: Secondary | ICD-10-CM | POA: Diagnosis not present

## 2019-01-05 DIAGNOSIS — Z90710 Acquired absence of both cervix and uterus: Secondary | ICD-10-CM | POA: Diagnosis not present

## 2019-01-05 DIAGNOSIS — Z923 Personal history of irradiation: Secondary | ICD-10-CM | POA: Diagnosis not present

## 2019-01-05 HISTORY — PX: IR NEPHROSTOMY EXCHANGE RIGHT: IMG6070

## 2019-01-05 HISTORY — PX: IR NEPHROSTOMY EXCHANGE LEFT: IMG6069

## 2019-01-05 LAB — BASIC METABOLIC PANEL
Anion gap: 6 (ref 5–15)
BUN: 35 mg/dL — ABNORMAL HIGH (ref 8–23)
CO2: 21 mmol/L — ABNORMAL LOW (ref 22–32)
Calcium: 9.2 mg/dL (ref 8.9–10.3)
Chloride: 112 mmol/L — ABNORMAL HIGH (ref 98–111)
Creatinine, Ser: 1.6 mg/dL — ABNORMAL HIGH (ref 0.44–1.00)
GFR calc Af Amer: 39 mL/min — ABNORMAL LOW (ref >60)
GFR calc non Af Amer: 34 mL/min — ABNORMAL LOW (ref >60)
Glucose, Bld: 103 mg/dL — ABNORMAL HIGH (ref 70–99)
Potassium: 4.1 mmol/L (ref 3.5–5.1)
Sodium: 139 mmol/L (ref 135–145)

## 2019-01-05 LAB — PROTIME-INR
INR: 0.9 (ref 0.8–1.2)
Prothrombin Time: 12.3 seconds (ref 11.4–15.2)

## 2019-01-05 LAB — GLUCOSE, CAPILLARY: Glucose-Capillary: 117 mg/dL — ABNORMAL HIGH (ref 70–99)

## 2019-01-05 LAB — CBC WITH DIFFERENTIAL/PLATELET
Abs Immature Granulocytes: 0.03 10*3/uL (ref 0.00–0.07)
Basophils Absolute: 0 10*3/uL (ref 0.0–0.1)
Basophils Relative: 1 %
Eosinophils Absolute: 0.7 10*3/uL — ABNORMAL HIGH (ref 0.0–0.5)
Eosinophils Relative: 15 %
HCT: 36.6 % (ref 36.0–46.0)
Hemoglobin: 11.1 g/dL — ABNORMAL LOW (ref 12.0–15.0)
Immature Granulocytes: 1 %
Lymphocytes Relative: 13 %
Lymphs Abs: 0.6 10*3/uL — ABNORMAL LOW (ref 0.7–4.0)
MCH: 29.8 pg (ref 26.0–34.0)
MCHC: 30.3 g/dL (ref 30.0–36.0)
MCV: 98.4 fL (ref 80.0–100.0)
Monocytes Absolute: 0.5 10*3/uL (ref 0.1–1.0)
Monocytes Relative: 12 %
Neutro Abs: 2.7 10*3/uL (ref 1.7–7.7)
Neutrophils Relative %: 58 %
Platelets: 259 10*3/uL (ref 150–400)
RBC: 3.72 MIL/uL — ABNORMAL LOW (ref 3.87–5.11)
RDW: 14.4 % (ref 11.5–15.5)
WBC: 4.6 10*3/uL (ref 4.0–10.5)
nRBC: 0 % (ref 0.0–0.2)

## 2019-01-05 MED ORDER — MIDAZOLAM HCL 2 MG/2ML IJ SOLN
INTRAMUSCULAR | Status: AC
  Filled 2019-01-05: qty 2

## 2019-01-05 MED ORDER — MIDAZOLAM HCL 2 MG/2ML IJ SOLN
INTRAMUSCULAR | Status: AC | PRN
  Administered 2019-01-05: 0.5 mg via INTRAVENOUS
  Administered 2019-01-05 (×2): 1 mg via INTRAVENOUS
  Administered 2019-01-05: 0.5 mg via INTRAVENOUS
  Administered 2019-01-05: 1 mg via INTRAVENOUS

## 2019-01-05 MED ORDER — LIDOCAINE HCL 1 % IJ SOLN
INTRAMUSCULAR | Status: AC
  Filled 2019-01-05: qty 20

## 2019-01-05 MED ORDER — FENTANYL CITRATE (PF) 100 MCG/2ML IJ SOLN
INTRAMUSCULAR | Status: AC | PRN
Start: 2019-01-05 — End: 2019-01-05
  Administered 2019-01-05 (×3): 50 ug via INTRAVENOUS

## 2019-01-05 MED ORDER — FENTANYL CITRATE (PF) 100 MCG/2ML IJ SOLN
INTRAMUSCULAR | Status: AC
  Filled 2019-01-05: qty 2

## 2019-01-05 MED ORDER — SODIUM CHLORIDE 0.9 % IV SOLN
INTRAVENOUS | Status: DC
  Administered 2019-01-05: 10:00:00 via INTRAVENOUS

## 2019-01-05 MED ORDER — IOHEXOL 300 MG/ML  SOLN
50.0000 mL | Freq: Once | INTRAMUSCULAR | Status: AC | PRN
  Administered 2019-01-05: 15 mL

## 2019-01-05 NOTE — Discharge Instructions (Addendum)
Moderate Conscious Sedation, Adult, Care After These instructions provide you with information about caring for yourself after your procedure. Your health care provider may also give you more specific instructions. Your treatment has been planned according to current medical practices, but problems sometimes occur. Call your health care provider if you have any problems or questions after your procedure. What can I expect after the procedure? After your procedure, it is common:  To feel sleepy for several hours.  To feel clumsy and have poor balance for several hours.  To have poor judgment for several hours.  To vomit if you eat too soon. Follow these instructions at home: For at least 24 hours after the procedure:  Percutaneous Nephrostomy, Care After This sheet gives you information about how to care for yourself after your procedure. Your health care provider may also give you more specific instructions. If you have problems or questions, contact your health care provider. What can I expect after the procedure? After the procedure, it is common to have:  Some soreness where the nephrostomy tube was inserted (tube insertion site).  Blood-tinged drainage from the nephrostomy tube for the first 24 hours. Follow these instructions at home: Activity  Return to your normal activities as told by your health care provider. Ask your health care provider what activities are safe for you.  Avoid activities that may cause the nephrostomy tubing to bend.  Do not take baths, swim, or use a hot tub until your health care provider approves. Ask your health care provider if you can take showers. Cover the nephrostomy tube dressing with a watertight covering when you take a shower.  Donot drive for 24 hours if you were given a medicine to help you relax (sedative). Care of the tube insertion site   Follow instructions from your health care provider about how to take care of your tube insertion  site. Make sure you: ? Wash your hands with soap and water before you change your bandage (dressing). If soap and water are not available, use hand sanitizer. ? Change your dressing as told by your health care provider. May change dressing 24 to 48 hours post procedure.  Be careful not to pull on the tube while removing the dressing. ? When you change the dressing, wash the skin around the tube, rinse well, and pat the skin dry.  Check the tube insertion area every day for signs of infection. Check for: ? More redness, swelling, or pain. ? More fluid or blood. ? Warmth. ? Pus or a bad smell. Care of the nephrostomy tube and drainage bag  Always keep the tubing, the leg bag, or the bedside drainage bags below the level of the kidney so that your urine drains freely.  When connecting your nephrostomy tube to a drainage bag, make sure that there are no kinks in the tubing and that your urine is draining freely. You may want to use an elastic bandage to wrap any exposed tubing that goes from the nephrostomy tube to any of the connecting tubes.  At night, you may want to connect your nephrostomy tube or the leg bag to a larger bedside drainage bag.  Follow instructions from your health care provider about how to empty or change the drainage bag.  Empty the drainage bag when it becomes ? full.  Replace the drainage bag and any extension tubing that is connected to your nephrostomy tube every 3 weeks or as often as told by your health care provider. Your health  care provider will explain how to change the drainage bag and extension tubing. General instructions  Take over-the-counter and prescription medicines only as told by your health care provider.  Keep all follow-up visits as told by your health care provider. This is important. Contact a health care provider if:  You have problems with any of the valves or tubing.  You have persistent pain or soreness in your back.  You have more  redness, swelling, or pain around your tube insertion site.  You have more fluid or blood coming from your tube insertion site.  Your tube insertion site feels warm to the touch.  You have pus or a bad smell coming from your tube insertion site.  You have increased urine output or you feel burning when urinating. Get help right away if:  You have pain in your abdomen during the first week.  You have chest pain or have trouble breathing.  You have a new appearance of blood in your urine.  You have a fever or chills.  You have back pain that is not relieved by your medicine.  You have decreased urine output.  Your nephrostomy tube comes out. This information is not intended to replace advice given to you by your health care provider. Make sure you discuss any questions you have with your health care provider. Document Released: 03/11/2004 Document Revised: 04/30/2016 Document Reviewed: 04/30/2016 Elsevier Interactive Patient Education  2019 [place] not: ? Participate in activities where you could fall or become injured. ? Drive. ? Use heavy machinery. ? Drink alcohol. ? Take sleeping pills or medicines that cause drowsiness. ? Make important decisions or sign legal documents. ? Take care of children on your own.  Rest. Eating and drinking  Follow the diet recommended by your health care provider.  If you vomit: ? Drink water, juice, or soup when you can drink without vomiting. ? Make sure you have little or no nausea before eating solid foods. General instructions  Have a responsible adult stay with you until you are awake and alert.  Take over-the-counter and prescription medicines only as told by your health care provider.  If you smoke, do not smoke without supervision.  Keep all follow-up visits as told by your health care provider. This is important. Contact a health care provider if:  You keep feeling nauseous or you keep vomiting.  You feel  light-headed.  You develop a rash.  You have a fever. Get help right away if:  You have trouble breathing. This information is not intended to replace advice given to you by your health care provider. Make sure you discuss any questions you have with your health care provider. Document Released: 05/09/2013 Document Revised: 12/22/2015 Document Reviewed: 11/08/2015 Elsevier Interactive Patient Education  2019 [place].  IR on call number for issues night post procedure [phone]

## 2019-01-05 NOTE — Patient Instructions (Signed)
Please notify Dr Laynee Lockamy at phone number [phone] if you notice vaginal bleeding, new pelvic or abdominal pains, bloating, feeling full easy, or a change in bladder or bowel function.   Please contact Dr Ronney Honeywell's office (at [phone]) in September, 2020 to request an appointment with her for December, 2020.  

## 2019-01-05 NOTE — Progress Notes (Signed)
Recurrent Endometrial cancer FOLLOWUP VISIT  Assessment:    63 y.o. year old with history of recurrent endometrioid endometrial/ovarian cancer.   S/p exploratory laparotomy, posterior supralevator pelvic exenteration, end colostomy, bladder repair, ureteral stenting with complete resection and negative margins on 11/09/14. S/p adjuvant radiation completed 03/10/15 Disease free since this time.  Postoperative and post-radiation right hydroureter and distal ureteral obstruction with ureteral stent in situ, repair complicated by vesico-colonic fistula formation.  No recurrence/persistence of tumor on imaging and physical exam (complete clinical response).   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 No specific follow-up or surveillance recommended by genetics counselor.  Plan: 1) History of recurrent endometrial/ovarian cancer: s/p complete resection with negative margins and s/p adjuvant radiation. No chemotherapy due to chronic idiopathic neutropenia.   CT imaging post treatment shows stable thickening of upper vagina and right peri-urethral tissues which is most likely radiation changes and postop changes. Will re-evaluate CT abd/pelvis to ensure no occult recurrence of disease given plans for extensive reconstructive surgery later this year.  Will continue 6 monthly surveillance exams until August, 2021.  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  Continuing follow-up with Dr Tresa Moore for stent replacements and management. Is now consulting with [facility] urologists, plastic surgeons and colorectal surgeons for possible radical reconstruction procedure including  total pelvic exenteration and permanent stoma/ileal conduit with paniculectomy this year now that she has reduced her BMI to <30kg/m2.   3) neutropenia - Dr Alvy Bimler is managing this and we appreciate.  4) breast cancer - stage IIA. ER/PR positive. Treated with surgery and arimidex.  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 Will have patient follow-up with Dr [name] for close colon cancer and upper GI cancer surveillance  6) Follow-up: I will see [name] for followup in December, 2020.  HPI:  [name] is a 63 y.o. 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  She has a history of endometrial and ovarian endometrioid carcinoma treated in 2006 by Dr. [name] at [facility] in [place]. Her surgery (TAH, BSO) was followed by adjuvant chemotherapy with carboplatin plus paclitaxel due to the ovarian involvement and the presence of a cul de sac lesion also positive for disease. She denies receiving adjuvant radiation therapy. She is unclear if she had metastatic endometrial cancer to the ovary or duel primaries. She had a complete response to therapy however developed leukopenia in July 2010. 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 She sees Dr. Alvy Bimler for this and is treated with G-CSF injections.  She began experiencing rectal pain approximately in January 2016. She also reports narrowing of caliber of the stool. She denies hematochezia. She does report approximately 3 months of vaginal spotting. She's had no specific follow-up for her gynecologic cancers in the past 4 years.  As part of workup of her rectal pain she underwent a CT scan of the abdomen and pelvis on 09/12/2014. This demonstrated a new right perirectal mass abutting the  vaginal cuff measuring 3.8 x 4.9 cm. There is a limited fat plane between the mass  and the rectum posteriorly. Rectal invasion could not be excluded. There were no other masses identified in the abdomen and pelvis or lymphadenopathy. There is no other evidence of metastatic disease or recurrent disease. There was no hydronephrosis. A CA-125 drawn on 09/12/2014 was normal at 10.  PET was negative for extrapelvic disease. MRI defined the lesion as a 3.5x5cm lesion to the right of the rectum at the vaginal cuff.  Colonoscopy was performed on 10/02/14 with transrectal [place] and biopsy and this revealed endometrioid adenocarcinoma. Of note, the lesion was not seen within the lumen of the rectum.   She then underwent a posterior supralevator exenteration with colostomy and bladder repair and stent placement on [birth year] without complications with Dr Denman [name] and Dr Johney Maine.  Her postoperative course was uncomplicate with the exception of development of postop anemia.  Her final pathology revealed endometrioid adenocarcinoma invading the vagina and rectum with negative margins on the specimen. The margin had been close (clinically) to the right pelvic sidewall which was marked with surgical clips.  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 It was treated with IV antibiotics and then on POD 15 she was taken to the OR for laparoscopic revision of the stoma with Dr Michael [name]. Postoperatively she had wound vac and packing for her stomal wound.  A retrograde cystogram on week 4 postop confirmed an intact bladder and the foley was removed.  She initially had some voiding issues with decreased sensation to void. We tested a post void residual in May, 2016 and this revealed adequate voiding.  On 12/30/14 she was [facility] to [facility] with sepsis associated with Enterobacter Cloacae UTI. This was treated with IV antibiotics and then a prolonged course  of oral cipro. Imaging performed at the time of admission (a CT of the abdo/pelvis) revealed: Bilateral double-J internal ureteral stents in adequate position with persistent mild bilateral hydronephrosis   She complete radiation therapy from 01/29/15 to 03/10/15 with 50Gy of external beam radiation and IMRT. She tolerated therapy well with minor skin irritation.  She had her ureteral stents removed in June, 2016, however, then developed right ureteral obstruction, hydroureter and pain. She went to the OR with Urologist Dr Tresa Moore on 03/20/15 for ureteral dilation and right stent placement (the left was draining well). No tumor was seen on cysto.   CT abdo/pelvis on 05/14/15 showed: no new lesions, stable thickening in right pelvis/distal right ureter consistent with radiation effect. The right ureteral stent was removed.  The patient's CT in January 2017 showed progression of her right ureteral obstruction after stent removal. Dr Tresa Moore took her to the OR on 09/05/15 for a cystoscopy, retrograde pyelogram and right ureteral stent placement.   The CT on 08/18/15 showed decreased attenuation of the right renal parenchyma, right hydronephrosis that was severe no excretion of contrast on that for graphic phase imaging. The perivascular soft tissue thickening in the pelvis and presacral regions grossly stable consistent with radiation changes.  The patient was diagnosed with ER/PR positive left breast cancer in April 2017. Surgery is scheduled for May. She underwent bilateral mastectomy with expander reconstruction. Postoperatively she was treated with Arimidex.   Repeat CT imaging on 02/08/16 revealed : Stable post treatment changes in presacral region. No evidence of recurrent or metastatic carcinoma within the abdomen or pelvis. Interval placement of right ureteral stent in appropriate position. Moderate right hydroureteronephrosis remains stable. Stable benign left adrenal adenoma and hepatic  steatosis.  On  06/30/16 she had replacement of her right ureteral stent with Dr Tresa Moore.  On 10/07/16 CT surveillance was performed which showed: Stable exam.  No new or progressive findings. Stable appearance abnormal presacral soft tissue compatible with post treatment change. Internal right ureteral stent with persistent right hydroureteronephrosis. Differentially decreased perfusion of the right kidney suggests a component of underlying obstructive uropathy. Stable 13 mm left adrenal nodule, previously characterized as adenoma. A parastomal hernia was noted.  CT 01/27/17 showed stable findings with no evidence of recurrence. Persistent right hydroureter was again noted.  Her colostomy bothered her greatly and she hoped to be free of stomal appliances. She was frustrated with repetitive stent replacements and desired definitive reconstruction/reimplantation of the right ureter.    Given that a 2 year disease free interval had elapsed, in 04/01/17 Dr Tresa Moore and Dr Johney Maine returned to the operating room with Ms Remillard for a right ureteral resection, and bladder hitch with reimplantation and reversal of hartman's with temporary ileostomy complicated by postop development of urinoma which required a peritoneal drain and right ureteral stent.  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 She was offered permanent diversion with ileal conduit and permanent end colostomy. This was not considered a reasonable option at that time by [facility]. She became quite depressed by the slow recovery and sequelae of her last surgery. She sought second opinion with Gynecologic Oncology at [facility]. They offered referral to urology and colorectal specialists at [facility]. They recommended no additional cancer treatment as she was disease free. She is being worked up by them for possible laparotomy, revision of right  uretero-neocystotomy, placement of VRAM flap, and possible bowel resection and revision of ileostomy. Prior to embarking on this surgery they requested she lose weight to a BMI of <30kg/m2.  Interval Hx:  She reports persistent high output ileostomy requiring IVF at home and electrolyte replacement.  She has lost weight to a BMI <30 and is planning radical reconstructive surgery later this year. She experienced some episodes of passage of bloody mucous from the vagina in May, 2020 and so an CT scan was performed which showed no signs of pelvic recurrence.   Review of systems: Constitutional:  She has no weight gain or weight loss. She has a low grade fever no chills. Eyes: No blurred vision Ears, Nose, Mouth, Throat: No dizziness, headaches or changes in hearing. No mouth sores. Cardiovascular: No chest pain, palpitations or edema. Respiratory:  No shortness of breath, wheezing  Gastrointestinal: She has normal bowel movements trhough stoma without diarrhea or constipation. She denies any nausea or vomiting. She denies blood in her stool or heart burn. Genitourinary:  See HPI Musculoskeletal: Denies muscle weakness or joint pains.  Skin:  She has no skin changes, rashes or itching Neurological:  Denies dizziness or headaches. No neuropathy, no numbness or tingling. Psychiatric:  She denies depression or anxiety. Hematologic/Lymphatic:   No easy bruising or bleeding  Allergies  Allergen Reactions  . Penicillins Swelling    Facial swelling/childhood allergy Has patient had a PCN reaction causing immediate rash, facial/tongue/throat swelling, SOB or lightheadedness with hypotension: Yes Has patient had a PCN reaction causing severe rash involving mucus membranes or skin necrosis: Yes Has patient had a PCN reaction that required hospitalization yes Has patient had a PCN reaction occurring within the last 10 years: No If all of the above answers are "NO",  then may proceed with Cephalosporin  use.   . Cefaclor Rash    Ceclor  . Erythromycin Other (See Comments)    Gastritis, abd cramps  . Tape Rash    blisters  . Trimethoprim Rash  . Ultram [Tramadol] Hives  . Cephalosporins Rash  . Fluconazole Rash  . Oxycodone Other (See Comments)    " I just feel weird"  . Pectin Rash    Pectin ring for stoma  . Septra [Sulfamethoxazole-Trimethoprim] Rash  . Sulfa Antibiotics Rash    Current Outpatient Medications on File Prior to Visit  Medication Sig Dispense Refill  . acetaminophen (TYLENOL) 325 MG tablet Take 2 tablets (650 mg total) by mouth every 6 (six) hours as needed. 30 tablet 1  . anastrozole (ARIMIDEX) 1 MG tablet TAKE 1 TABLET DAILY (Patient taking differently: Take 1 mg by mouth daily. ) 90 tablet 3  . Biotin 5 MG TABS Take 5 mg by mouth every morning.     [name] Kitchen buPROPion (WELLBUTRIN SR) 200 MG 12 hr tablet Take 1 tablet (200 mg total) by mouth daily. 30 tablet 0  . Cholecalciferol (VITAMIN D3) 10000 UNITS capsule Take 10,000 Units by mouth once a week. Sunday evening's    . clobetasol (OLUX) 0.05 % topical foam Apply topically 2 (two) times daily.    . diphenhydrAMINE (BENADRYL) 25 mg capsule Take 1 capsule (25 mg total) by mouth every 8 (eight) hours as needed for itching, allergies or sleep. 30 capsule 0  . diphenoxylate-atropine (LOMOTIL) 2.5-0.025 MG tablet Take 1 tablet by mouth 4 (four) times daily as needed for diarrhea or loose stools. TO PREVENT LOOSE BOWEL MOVEMENTS 30 tablet 0  . fentaNYL (DURAGESIC) 50 MCG/HR Place 1 patch onto the skin every 3 (three) days. 10 patch 0  . ferrous sulfate 325 (65 FE) MG tablet Take 1 tablet (325 mg total) by mouth at bedtime. 30 tablet 3  . filgrastim-sndz (ZARXIO) 480 MCG/0.8ML SOSY injection Inject 0.8 mLs (480 mcg total) into the skin as directed. Inject 480 mcg every 5 days 0.8 mL 99  . HYDROcodone-acetaminophen (NORCO/VICODIN) 5-325 MG tablet Take 2 tablets by mouth every 6 (six) hours as needed for moderate pain.    [name] Kitchen  JANUVIA 50 MG tablet Take 50 mg by mouth daily.     [name] Kitchen LACTATED RINGERS IV Inject into the vein 3 (three) times a week.    . levothyroxine (SYNTHROID, LEVOTHROID) 150 MCG tablet Take 1 tablet (150 mcg total) by mouth daily before breakfast. 30 tablet 1  . loratadine (CLARITIN) 10 MG tablet Take 10 mg by mouth every morning.     . nitrofurantoin, macrocrystal-monohydrate, (MACROBID) 100 MG capsule Take 100 mg by mouth at bedtime.    [name] Kitchen omega-3 acid ethyl esters (LOVAZA) 1 G capsule Take 1 g by mouth 2 (two) times daily.     [name] Kitchen oxybutynin (DITROPAN) 5 MG tablet     . Polyethyl Glycol-Propyl Glycol (SYSTANE OP) Place 1 drop into both eyes daily as needed (dry eyes).     . pregabalin (LYRICA) 50 MG capsule TAKE 1 CAPSULE(50 MG) BY MOUTH TWICE DAILY 180 capsule 3  . Prenatal Vit-Fe Fumarate-FA (PRENATAL VITAMIN PO) Take 1 capsule by mouth daily. Takes prenatal because there are no dyes in it    . rosuvastatin (CRESTOR) 10 MG tablet Take 10 mg by mouth every evening.     . Saccharomyces boulardii (FLORASTOR PO) Take 1 capsule by mouth daily.     No current facility-administered medications on  file prior to visit.     Past Medical History:  Diagnosis Date  . Adrenal adenoma, left 02/08/2016   CT: stable benign  . Anemia in neoplastic disease   . Back pain   . Benign essential HTN   . 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 Cancer of corpus uteri, except isthmus [facility])  oncologist-- dr Denman [name] and dr Alvy Bimler    10-15-2004  dx endometroid endometrial and ovarian cancer s/p  chemotheapy and surgery(TAH w/ BSO) :  recurrent 11-19-2014 post pelvic surgery and radiation 01-29-2015 to 03-10-2015  . Chronic idiopathic neutropenia (HCC)    presumed related to chemotherapy March 2006--- followed by dr Alvy Bimler (treatment w/  G-CSF injections  . Chronic nausea   . Chronic pain    perineal/ anal  area from bladder pad irritates skin , right flank pain  . CKD stage G2/A3, GFR 60-89 and albumin creatinine ratio >300 mg/g    nephrologist-  dr Madelon Lips  . Colovesical fistula   . Diabetic retinopathy, background ([place])   . Difficult intravenous access    small veins--- hx PICC lines  . DM type 2 (diabetes mellitus, type 2) ([place])    monitored by dr Legrand Como altheimer  . Dysuria   . Environmental and seasonal allergies   . Fatty liver 02/08/2016   CT  . Generalized muscle weakness   . GERD (gastroesophageal reflux disease)   . Hiatal hernia   . History of abdominal abscess 04/16/2017   post surgery 04-01-2017  --- resolved 10/ 2018  . History of gastric polyp    2014  duodenum  . History of ileus 04/16/2017   resolved w/ no surgical intervention  . History of radiation therapy    01-29-2015 to 03-10-2015  pelvis 50.4Gy  . Hypothyroidism    monitored by dr Legrand Como altheimer  . IBS (irritable bowel syndrome)   . Ileostomy in place [facility]) 04/01/2017   created at same time colostomy takedown.  . Joint pain   . Leg edema   . Lower urinary tract symptoms (LUTS)    urge urinary  incontinence  . Mixed dyslipidemia   . Multiple thyroid nodules    Managed by Dr. Harlow Asa  . Nephrostomy status ([place])   . Palpitations   . Pelvic abscess in female 04/16/2017  . PONV (postoperative nausea and vomiting)    "scopolamine patch works for me"  . Radiation-induced dermatitis    contact dermatitis , radiation completed, rash only on ankles now.  . Seasonal allergies   . Ureteral stricture, right UROLOGIT-  DR [facility]   CHRONIC--  TREATMENT URETERAL STENT  . 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 Vitamin D deficiency   . Wears glasses     Past Surgical History:  Procedure Laterality Date  . APPENDECTOMY    . biopsy thyroid nodules    . BREAST RECONSTRUCTION WITH PLACEMENT OF TISSUE EXPANDER AND FLEX HD  (ACELLULAR HYDRATED DERMIS) Bilateral 12/11/2015   Procedure: BILATERAL BREAST RECONSTRUCTION WITH PLACEMENT OF TISSUE EXPANDERS;  Surgeon: Irene Limbo, MD;  Location: [place];  Service: Plastics;  Laterality: Bilateral;  . COLONOSCOPY WITH PROPOFOL N/A 08/21/2013   Procedure: COLONOSCOPY WITH PROPOFOL;  Surgeon: Cleotis Nipper, MD;  Location: [facility];  Service: Endoscopy;  Laterality: N/A;  . COLOSTOMY TAKEDOWN N/A 12/04/2014   Procedure:  LAPROSCOPIC LYSIS OF ADHESIONS, SPLENIC MOBILIZATION, RELOCATION OF COLOSTOMY, DEBRIDEMENT INITIAL COLOSTOMY SITE;  Surgeon: Michael [name], MD;  Location: [facility];  Service: General;  Laterality: N/A;  . CYSTOGRAM N/A 06/01/2017   Procedure: CYSTOGRAM;  Surgeon: Alexis Frock, MD;  [facility];  Service: Urology;  Laterality: N/A;  . CYSTOSCOPY W/ RETROGRADES Right 11/21/2015   Procedure: CYSTOSCOPY WITH RETROGRADE PYELOGRAM;  Surgeon: Alexis Frock, MD;  [facility];  Service: Urology;  Laterality: Right;  . CYSTOSCOPY W/ URETERAL STENT PLACEMENT Right 11/21/2015   Procedure: CYSTOSCOPY WITH STENT REPLACEMENT;  Surgeon: Alexis Frock, MD;  [facility];  Service: Urology;  Laterality: Right;  . CYSTOSCOPY W/ URETERAL STENT PLACEMENT Right 03/10/2016   Procedure: CYSTOSCOPY WITH STENT REPLACEMENT;  Surgeon: Alexis Frock, MD;  Location: [facility];  Service: Urology;  Laterality: Right;  . CYSTOSCOPY W/ URETERAL STENT PLACEMENT Right 06/30/2016   Procedure: CYSTOSCOPY WITH RETROGRADE PYELOGRAM/URETERAL STENT EXCHANGE;  Surgeon: Alexis Frock, MD;  Location: [facility];  Service: Urology;  Laterality: Right;  . CYSTOSCOPY W/ URETERAL STENT PLACEMENT N/A 06/01/2017   Procedure: CYSTOSCOPY WITH EXAM UNDER ANESTHESIA;  Surgeon: Alexis Frock, MD;  [facility];  Service: Urology;  Laterality: N/A;  . CYSTOSCOPY W/ URETERAL STENT PLACEMENT Right 08/17/2017   Procedure: CYSTOSCOPY WITH RETROGRADE  PYELOGRAM/URETERAL STENT REMOVAL;  Surgeon: Alexis Frock, MD;  Location[facility];  Service: Urology;  Laterality: Right;  . CYSTOSCOPY WITH RETROGRADE PYELOGRAM, URETEROSCOPY AND STENT PLACEMENT Right 03/20/2015   Procedure: CYSTOSCOPY WITH RETROGRADE PYELOGRAM, URETEROSCOPY WITH BALLOON DILATION AND STENT PLACEMENT ON RIGHT;  Surgeon: Alexis Frock, MD;  Location: [facility];  Service: Urology;  Laterality: Right;  . CYSTOSCOPY WITH RETROGRADE PYELOGRAM, URETEROSCOPY AND STENT PLACEMENT Right 05/02/2015   Procedure: CYSTOSCOPY WITH RIGHT RETROGRADE PYELOGRAM,  DIAGNOSTIC URETEROSCOPY AND STENT PULL ;  Surgeon: Alexis Frock, MD;  Location: [facility];  Service: Urology;  Laterality: Right;  . CYSTOSCOPY WITH RETROGRADE PYELOGRAM, URETEROSCOPY AND STENT PLACEMENT Right 09/05/2015   Procedure: CYSTOSCOPY WITH RETROGRADE PYELOGRAM,  AND STENT PLACEMENT;  Surgeon: Alexis Frock, MD;  [facility];  Service: Urology;  Laterality: Right;  . CYSTOSCOPY WITH RETROGRADE PYELOGRAM, URETEROSCOPY AND STENT PLACEMENT Right 04/01/2017   Procedure: CYSTOSCOPY WITH RETROGRADE PYELOGRAM, URETEROSCOPY AND STENT PLACEMENT;  Surgeon: Alexis Frock, MD;  [facility];  Service: Urology;  Laterality: Right;  . CYSTOSCOPY WITH STENT PLACEMENT Right 10/27/2016   Procedure: CYSTOSCOPY WITH STENT CHANGE and right retrograde pyelogram;  Surgeon: Alexis Frock, MD;  Location: [facility];  Service: Urology;  Laterality: Right;  . EUS N/A 10/02/2014   Procedure: LOWER ENDOSCOPIC ULTRASOUND (EUS);  Surgeon: Arta Silence, MD;  Location: [facility];  Service: Endoscopy;  Laterality: N/A;  . EXCISION SOFT TISSUE MASS RIGHT FOREMAN  12-08-2006  . EYE SURGERY  as child   pytosis of eyelids repair  . INCISION AND DRAINAGE OF WOUND Bilateral 12/26/2015   Procedure: DEBRIDEMENT OF BILATERAL MASTECTOMY FLAPS;  Surgeon: Irene Limbo, MD;  Location: [place];  Service: Plastics;  Laterality: Bilateral;  . IR CV LINE INJECTION  05/31/2017  . IR FLUORO GUIDE CV LINE LEFT  05/31/2017  . IR FLUORO GUIDE CV LINE RIGHT  04/06/2017  . IR FLUORO GUIDE CV MIDLINE PICC RIGHT  05/30/2017  . IR NEPHROSTOGRAM LEFT INITIAL PLACEMENT  09/02/2017  . IR NEPHROSTOGRAM LEFT THRU EXISTING ACCESS  11/29/2017  . IR NEPHROSTOGRAM RIGHT INITIAL PLACEMENT  09/02/2017  . IR NEPHROSTOGRAM RIGHT THRU EXISTING ACCESS  09/13/2017  . IR NEPHROSTOGRAM RIGHT THRU EXISTING ACCESS  11/29/2017  . IR NEPHROSTOMY EXCHANGE LEFT  11/28/2017  . IR NEPHROSTOMY EXCHANGE LEFT  01/05/2018  . IR NEPHROSTOMY EXCHANGE LEFT  02/16/2018  . IR NEPHROSTOMY EXCHANGE LEFT  03/30/2018  . IR NEPHROSTOMY EXCHANGE LEFT  05/12/2018  . IR NEPHROSTOMY EXCHANGE LEFT  06/21/2018  . IR NEPHROSTOMY EXCHANGE LEFT  08/04/2018  . IR NEPHROSTOMY EXCHANGE LEFT  09/18/2018  . IR NEPHROSTOMY EXCHANGE LEFT  10/09/2018  . IR NEPHROSTOMY EXCHANGE LEFT  10/27/2018  . IR NEPHROSTOMY EXCHANGE LEFT  11/21/2018  . IR NEPHROSTOMY EXCHANGE LEFT  01/05/2019  . IR NEPHROSTOMY EXCHANGE RIGHT  10/02/2017  . IR NEPHROSTOMY EXCHANGE RIGHT  11/28/2017  . IR NEPHROSTOMY EXCHANGE RIGHT  01/05/2018  . IR NEPHROSTOMY EXCHANGE RIGHT  02/16/2018  . IR NEPHROSTOMY EXCHANGE RIGHT  03/30/2018  . IR NEPHROSTOMY EXCHANGE RIGHT  05/12/2018  . IR NEPHROSTOMY EXCHANGE RIGHT  06/21/2018  . IR NEPHROSTOMY EXCHANGE RIGHT  08/04/2018  . IR NEPHROSTOMY EXCHANGE RIGHT  09/18/2018  . IR NEPHROSTOMY EXCHANGE RIGHT  10/27/2018  . IR NEPHROSTOMY EXCHANGE RIGHT  11/21/2018  . IR NEPHROSTOMY EXCHANGE RIGHT  01/05/2019  . IR NEPHROSTOMY PLACEMENT LEFT  10/02/2017  . IR RADIOLOGIST EVAL & MGMT  05/03/2017  . IR US GUIDE VASC ACCESS LEFT  05/31/2017  . IR US GUIDE VASC ACCESS RIGHT  04/06/2017  . IR US GUIDE VASC ACCESS RIGHT  05/30/2017  . LAPAROSCOPIC CHOLECYSTECTOMY  1990  . LIPOSUCTION WITH LIPOFILLING Bilateral 04/16/2016   Procedure: LIPOSUCTION WITH LIPOFILLING  TO BILATERAL CHEST;  Surgeon: Irene Limbo, MD;  Location: [place];  Service: Plastics;  Laterality: Bilateral;  . MASTECTOMY W/ SENTINEL NODE BIOPSY Bilateral 12/11/2015   Procedure: RIGHT PROPHYLACTIC MASTECTOMY, LEFT TOTAL MASTECTOMY WITH LEFT AXILLARY SENTINEL LYMPH NODE BIOPSY;  Surgeon: Stark Klein, MD;  Location: [place];  Service: General;  Laterality: Bilateral;  . OSTOMY N/A 11/19/2014   Procedure: OSTOMY;  Surgeon: Michael [name], MD;  Location: [facility];  Service: General;  Laterality: N/A;  . PROCTOSCOPY N/A 04/01/2017   Procedure: RIDGE PROCTOSCOPY;  Surgeon: Michael [name], MD;  Location: [facility];  Service: General;  Laterality: N/A;  . REMOVAL OF BILATERAL TISSUE EXPANDERS WITH PLACEMENT OF BILATERAL BREAST IMPLANTS Bilateral 04/16/2016   Procedure: REMOVAL OF BILATERAL TISSUE EXPANDERS WITH PLACEMENT OF BILATERAL BREAST IMPLANTS;  Surgeon: Irene Limbo, MD;  Location: [place];  Service: Plastics;  Laterality: Bilateral;  . ROBOTIC ASSISTED LAP VAGINAL HYSTERECTOMY N/A 11/19/2014   Procedure: ROBOTIC LYSIS OF ADHESIONS, CONVERTED TO LAPAROTOMY RADICAL UPPER VAGINECTOMY,LOW ANTERIOR BOWEL RESECTION, COLOSTOMY, BILATERAL URETERAL STENT PLACEMENT AND CYSTONOMY CLOSURE;  Surgeon: Everitt Amber, MD;  Location: [facility];  Service: Gynecology;  Laterality: N/A;  . TISSUE EXPANDER FILLING Bilateral 12/26/2015   Procedure: EXPANSION OF BILATERAL CHEST TISSUE EXPANDERS (60 mL- Right; 75 mL- Left);  Surgeon: Irene Limbo, MD;  Location: [place];  Service: Plastics;  Laterality: Bilateral;  . TONSILLECTOMY    . TOTAL ABDOMINAL HYSTERECTOMY  March 2006   [facility]   and Bilateral Salpingoophorectomy/  staging for Ovarian cancer/  an  . XI ROBOTIC ASSISTED LOWER ANTERIOR RESECTION N/A 04/01/2017   Procedure: XI ROBOTIC VS LAPAROSCOPIC COLOSTOMY TAKEDOWN WITH LYSIS OF ADHESIONS.;  Surgeon: Michael [name], MD;  Location: [facility];  Service: General;   Laterality: N/A;  [facility]AS PATHWAY    Family History  Problem Relation  Age of Onset  . Cancer Mother 16       stomach ca  . Hypertension Mother   . Cancer Father 38       prostate ca  . Diabetes Father   . Heart disease Father        CABG  . Hypertension Father   . Hyperlipidemia Father   . Obesity Father   . Breast cancer Maternal Aunt        dx in her 53s  . Lymphoma Paternal Aunt   . Brain cancer Paternal Grandfather   . Ovarian cancer Other   . Diabetes Sister   . Hypertension Brother y-10  . Heart disease Brother        CABG  . Diabetes Brother     Social History   Socioeconomic History  . Marital status: Married    Spouse name: Khloee Garza  . Number of children: 1  . Years of education: Not on file  . Highest education level: Not on file  Occupational History  . 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 Financial resource strain: Not on file  . Food insecurity:    Worry: Not on file    Inability: Not on file  . Transportation needs:    Medical: Not on file    Non-medical: Not on file  Tobacco Use  . Smoking status: Never Smoker  . Smokeless tobacco: Never Used  Substance and Sexual Activity  . Alcohol use: Not Currently  . Drug use: No  . Sexual activity: Not on file  Lifestyle  . Physical activity:    Days per week: Not on file    Minutes per session: Not on file  . Stress: Not on file  Relationships  . Social connections:    Talks on phone: Not on file    Gets together: Not on file    Attends religious service: Not on file    Active member of club or organization: Not on file    Attends meetings of clubs or organizations: Not on file    Relationship status: Not on file  . Intimate partner violence:    Fear of current or ex partner: Not on file    Emotionally abused: Not on file    Physically abused: Not on file    Forced sexual activity: Not on file  Other Topics Concern  . Not on file  Social History  Narrative   Exercise-- has not gotten back into it since cancer came back   CBC    Component Value Date/Time   WBC 4.6 01/05/2019 1010   RBC 3.72 (L) 01/05/2019 1010   HGB 11.1 (L) 01/05/2019 1010   HGB 10.2 (L) 03/27/2018 1324   HGB 12.4 07/29/2017 1444   HCT 36.6 01/05/2019 1010   HCT 38.0 07/29/2017 1444   PLT 259 01/05/2019 1010   PLT 268 03/27/2018 1324   PLT 260 07/29/2017 1444   MCV 98.4 01/05/2019 1010   MCV 90.9 07/29/2017 1444   MCH 29.8 01/05/2019 1010   MCHC 30.3 01/05/2019 1010   RDW 14.4 01/05/2019 1010   RDW 15.5 (H) 07/29/2017 1444   LYMPHSABS 0.6 (L) 01/05/2019 1010   LYMPHSABS 0.8 (L) 07/29/2017 1444   MONOABS 0.5 01/05/2019 1010   MONOABS 1.0 (H) 07/29/2017 1444   EOSABS 0.7 (H) 01/05/2019 1010   EOSABS 0.0 07/29/2017 1444   BASOSABS 0.0 01/05/2019 1010   BASOSABS 0.0 07/29/2017  1444     Physical Exam: Blood pressure (!) 112/56, pulse 79, temperature 98 F (36.7 C), temperature source Oral, resp. rate 18, height 5' 2"  (1.575 m), SpO2 98 %. General: Well dressed, well nourished in no apparent distress.   HEENT:  Normocephalic and atraumatic, no lesions.  Extraocular muscles intact. Sclerae anicteric. Pupils equal, round, reactive. No mouth sores or ulcers. Thyroid is normal size, not nodular, midline. Skin:  No lesions or rashes. Breasts:  deferred Lungs:  Clear to auscultation bilaterally.  No wheezes. Cardiovascular:  Regular rate and rhythm.  No murmurs or rubs. Abdomen:   Soft, somewhat tender, nondistended.  No palpable masses.  No hepatosplenomegaly.  No ascites. Normal bowel sounds.  No hernias.  Incision is healed. Ostomy appliance in situ.  Genitourinary: vaginal cuff intact without blood or fluid in the vault. Severe radiation fibrosis with indurated tissues. Normal mucosa. No palpable masses in pelvis. There is a small amount of blood from the left vaginal cuff which may be consistent with a fistula to the rectum or bladder.  Rectal exam: stump  in tact, no masses Extremities: No cyanosis, clubbing or edema.  No calf tenderness or erythema. No palpable cords. Psychiatric: Mood and affect are appropriate. Neurological: Awake, alert and oriented x 3. Sensation is intact, no neuropathy.  Musculoskeletal: No pain, normal strength and range of motion.  Thereasa Solo, MD  CC: Dr Tresa Moore, Dr Alvy Bimler, Dr Theressa Millard, Dr Madelon Lips

## 2019-01-05 NOTE — Progress Notes (Signed)
Bilateral Nephrostomy tubes draining bloody urine upon discharge.

## 2019-01-05 NOTE — Procedures (Signed)
Pre Procedure Dx: Hydronephrosis Post Procedure Dx: Same  Successful bilateral PCN exchange and conversion from 12 Fr Bettey Mare to 12 Fr standard PCNs.  EBL: None   No immediate complications.   Ronny Bacon, MD Pager #: [phone]

## 2019-01-05 NOTE — H&P (Addendum)
Referring Physician(s): Manny,T  Supervising Physician: Sandi Mariscal  Patient Status:  [facility]  Chief Complaint: "I'm getting my kidney tubes changed"   Subjective: Patient with history of CKD,  breast cancer as well as ovarian cancer and recurrent endometrial cancer with prior low anterior resection/partial vaginectomy and colostomy in 2016.  She underwent colostomy takedown with loop ileostomy diversion in August 2018.  She also had robotic right ureteral reimplant/double-J stent with psoas bladder hitch in 2018 with stent removal on 08/17/2017.  She also has a history of rectovesical fistula.  She underwent diverting bilateral percutaneous nephrostomies on 09/02/2017.  She has had chronic bilateral nephrostomies since with frequent exchanges and latest on 11/21/2018 secondary to malfunctioning left-sided tube.  This was attributable to withdrawal of the pigtail catheter into a calyx likely leading to at least partial obstruction/blockage.  She presents now with similar complaints of minimal output from left nephrostomy and is scheduled today for bilateral nephrostomy tube exchanges.  Latest CT abdomen pelvis reveals increased mild left hydronephrosis with left nephrostomy tube in abnormal position located posterior to the renal collecting system.  She currently denies fever, headache, chest pain, dyspnea, cough, back pain, vomiting.  He does have fatigue, some mild abdominal discomfort and as well as small amount of pain from perineal region occasionally.  She has a Foley catheter in place draining yellow urine.  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+, HER2 negative/  12-11-2015 bilateral mastecotmy w/ reconstruction (no radiation and  no chemo)   Cancer of corpus uteri, except isthmus [facility])  oncologist-- dr Denman [name] and dr Alvy Bimler    10-15-2004  dx endometroid endometrial and ovarian cancer s/p  chemotheapy and surgery(TAH w/ BSO) :  recurrent 11-19-2014 post pelvic surgery and radiation 01-29-2015 to 03-10-2015   Chronic idiopathic neutropenia ([place])    presumed related to chemotherapy March 2006--- followed by dr Alvy Bimler (treatment w/ G-CSF injections   Chronic nausea    Chronic pain    perineal/ anal  area from bladder pad irritates skin , right flank pain   CKD stage G2/A3, GFR 60-89 and albumin creatinine ratio >300 mg/g    nephrologist-  dr Madelon Lips   Colovesical fistula    Diabetic retinopathy, background ([place])    Difficult intravenous access    small veins--- hx PICC lines   DM type 2 (diabetes mellitus, type 2) ([place])    monitored by dr Legrand Como altheimer   Dysuria    Environmental and seasonal allergies    Fatty liver 02/08/2016   CT   Generalized muscle weakness    GERD (gastroesophageal reflux disease)    Hiatal hernia    History of abdominal abscess 04/16/2017   post surgery 04-01-2017  --- resolved 10/ 2018   History of gastric polyp    2014  duodenum   History of ileus 04/16/2017   resolved w/ no surgical intervention   History of radiation therapy    01-29-2015 to 03-10-2015  pelvis 50.4Gy   Hypothyroidism    monitored by dr Legrand Como altheimer   IBS (irritable bowel syndrome)    Ileostomy in place ([place]) 04/01/2017   created at same time colostomy takedown.   Joint pain  Leg edema    Lower urinary tract symptoms (LUTS)    urge urinary  incontinence   Mixed dyslipidemia    Multiple thyroid nodules    Managed by Dr. Harlow Asa   Nephrostomy status [facility])    Palpitations    Pelvic abscess in female 04/16/2017   PONV (postoperative nausea and vomiting)    "scopolamine patch works for me"   Radiation-induced dermatitis    contact dermatitis , radiation  completed, rash only on ankles now.   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   Vitamin D deficiency    Wears glasses    Past Surgical History:  Procedure Laterality Date   APPENDECTOMY     biopsy thyroid nodules     BREAST RECONSTRUCTION WITH PLACEMENT OF TISSUE EXPANDER AND FLEX HD (ACELLULAR HYDRATED DERMIS) Bilateral 12/11/2015   Procedure: BILATERAL BREAST RECONSTRUCTION WITH PLACEMENT OF TISSUE EXPANDERS;  Surgeon: Irene Limbo, MD;  Location: [place];  Service: Plastics;  Laterality: Bilateral;   COLONOSCOPY WITH PROPOFOL N/A 08/21/2013   Procedure: COLONOSCOPY WITH PROPOFOL;  Surgeon: Cleotis Nipper, MD;  [facility];  Service: Endoscopy;  Laterality: N/A;   COLOSTOMY TAKEDOWN N/A 12/04/2014   Procedure: LAPROSCOPIC LYSIS OF ADHESIONS, SPLENIC MOBILIZATION, RELOCATION OF COLOSTOMY, DEBRIDEMENT INITIAL COLOSTOMY SITE;  Surgeon: Michael [name], MD;  [facility];  Service: General;  Laterality: N/A;   CYSTOGRAM N/A 06/01/2017   Procedure: CYSTOGRAM;  Surgeon: Alexis Frock, MD;  [facility];  Service: Urology;  Laterality: N/A;   CYSTOSCOPY W/ RETROGRADES Right 11/21/2015   Procedure: CYSTOSCOPY WITH RETROGRADE PYELOGRAM;  Surgeon: Alexis Frock, MD;  [facility];  Service: Urology;  Laterality: Right;   CYSTOSCOPY W/ URETERAL STENT PLACEMENT Right 11/21/2015   Procedure: CYSTOSCOPY WITH STENT REPLACEMENT;  Surgeon: Alexis Frock, MD;  [facility];  Service: Urology;  Laterality: Right;   CYSTOSCOPY W/ URETERAL STENT PLACEMENT Right 03/10/2016   Procedure: CYSTOSCOPY WITH STENT REPLACEMENT;  Surgeon: Alexis Frock, MD;  Location: [facility];  Service: Urology;  Laterality: Right;   CYSTOSCOPY W/ URETERAL STENT PLACEMENT Right 06/30/2016   Procedure: CYSTOSCOPY WITH RETROGRADE PYELOGRAM/URETERAL STENT EXCHANGE;   Surgeon: Alexis Frock, MD;  Location: [name];  Service: Urology;  Laterality: Right;   CYSTOSCOPY W/ URETERAL STENT PLACEMENT N/A 06/01/2017   Procedure: CYSTOSCOPY WITH EXAM UNDER ANESTHESIA;  Surgeon: Alexis Frock, MD;  [facility];  Service: Urology;  Laterality: N/A;   CYSTOSCOPY W/ URETERAL STENT PLACEMENT Right 08/17/2017   Procedure: CYSTOSCOPY WITH RETROGRADE PYELOGRAM/URETERAL STENT REMOVAL;  Surgeon: Alexis Frock, MD;  Location: [facility];  Service: Urology;  Laterality: Right;   CYSTOSCOPY WITH RETROGRADE PYELOGRAM, URETEROSCOPY AND STENT PLACEMENT Right 03/20/2015   Procedure: CYSTOSCOPY WITH RETROGRADE PYELOGRAM, URETEROSCOPY WITH BALLOON DILATION AND STENT PLACEMENT ON RIGHT;  Surgeon: Alexis Frock, MD;  Location: [facility];  Service: Urology;  Laterality: Right;   CYSTOSCOPY WITH RETROGRADE PYELOGRAM, URETEROSCOPY AND STENT PLACEMENT Right 05/02/2015   Procedure: CYSTOSCOPY WITH RIGHT RETROGRADE PYELOGRAM,  DIAGNOSTIC URETEROSCOPY AND STENT PULL ;  Surgeon: Alexis Frock, MD;  Location: [facility]  Service: Urology;  Laterality: Right;   CYSTOSCOPY WITH RETROGRADE PYELOGRAM, URETEROSCOPY AND STENT PLACEMENT Right 09/05/2015   Procedure: CYSTOSCOPY WITH RETROGRADE PYELOGRAM,  AND STENT PLACEMENT;  Surgeon: Alexis Frock, MD;  [facility];  Service: Urology;  Laterality: Right;  CYSTOSCOPY WITH RETROGRADE PYELOGRAM, URETEROSCOPY AND STENT PLACEMENT Right 04/01/2017   Procedure: CYSTOSCOPY WITH RETROGRADE PYELOGRAM, URETEROSCOPY AND STENT PLACEMENT;  Surgeon: Alexis Frock, MD;  [facility];  Service: Urology;  Laterality: Right;   CYSTOSCOPY WITH STENT PLACEMENT Right 10/27/2016   Procedure: CYSTOSCOPY WITH STENT CHANGE and right retrograde pyelogram;  Surgeon: Alexis Frock, MD;  Location: [facility];  Service: Urology;  Laterality: Right;   EUS N/A 10/02/2014    Procedure: LOWER ENDOSCOPIC ULTRASOUND (EUS);  Surgeon: Arta Silence, MD;  Location: [facility];  Service: Endoscopy;  Laterality: N/A;   EXCISION SOFT TISSUE MASS RIGHT FOREMAN  12-08-2006   EYE SURGERY  as child   pytosis of eyelids repair   INCISION AND DRAINAGE OF WOUND Bilateral 12/26/2015   Procedure: DEBRIDEMENT OF BILATERAL MASTECTOMY FLAPS;  Surgeon: Irene Limbo, MD;  Location: [place];  Service: Plastics;  Laterality: Bilateral;   IR CV LINE INJECTION  05/31/2017   IR FLUORO GUIDE CV LINE LEFT  05/31/2017   IR FLUORO GUIDE CV LINE RIGHT  04/06/2017   IR FLUORO GUIDE CV MIDLINE PICC RIGHT  05/30/2017   IR NEPHROSTOGRAM LEFT INITIAL PLACEMENT  09/02/2017   IR NEPHROSTOGRAM LEFT THRU EXISTING ACCESS  11/29/2017   IR NEPHROSTOGRAM RIGHT INITIAL PLACEMENT  09/02/2017   IR NEPHROSTOGRAM RIGHT THRU EXISTING ACCESS  09/13/2017   IR NEPHROSTOGRAM RIGHT THRU EXISTING ACCESS  11/29/2017   IR NEPHROSTOMY EXCHANGE LEFT  11/28/2017   IR NEPHROSTOMY EXCHANGE LEFT  01/05/2018   IR NEPHROSTOMY EXCHANGE LEFT  02/16/2018   IR NEPHROSTOMY EXCHANGE LEFT  03/30/2018   IR NEPHROSTOMY EXCHANGE LEFT  05/12/2018   IR NEPHROSTOMY EXCHANGE LEFT  06/21/2018   IR NEPHROSTOMY EXCHANGE LEFT  08/04/2018   IR NEPHROSTOMY EXCHANGE LEFT  09/18/2018   IR NEPHROSTOMY EXCHANGE LEFT  10/09/2018   IR NEPHROSTOMY EXCHANGE LEFT  10/27/2018   IR NEPHROSTOMY EXCHANGE LEFT  11/21/2018   IR NEPHROSTOMY EXCHANGE RIGHT  10/02/2017   IR NEPHROSTOMY EXCHANGE RIGHT  11/28/2017   IR NEPHROSTOMY EXCHANGE RIGHT  01/05/2018   IR NEPHROSTOMY EXCHANGE RIGHT  02/16/2018   IR NEPHROSTOMY EXCHANGE RIGHT  03/30/2018   IR NEPHROSTOMY EXCHANGE RIGHT  05/12/2018   IR NEPHROSTOMY EXCHANGE RIGHT  06/21/2018   IR NEPHROSTOMY EXCHANGE RIGHT  08/04/2018   IR NEPHROSTOMY EXCHANGE RIGHT  09/18/2018   IR NEPHROSTOMY EXCHANGE RIGHT  10/27/2018   IR NEPHROSTOMY EXCHANGE RIGHT  11/21/2018   IR NEPHROSTOMY PLACEMENT  LEFT  10/02/2017   IR RADIOLOGIST EVAL & MGMT  05/03/2017   IR US GUIDE VASC ACCESS LEFT  05/31/2017   IR US GUIDE VASC ACCESS RIGHT  04/06/2017   IR US GUIDE VASC ACCESS RIGHT  05/30/2017   LAPAROSCOPIC CHOLECYSTECTOMY  1990   LIPOSUCTION WITH LIPOFILLING Bilateral 04/16/2016   Procedure: LIPOSUCTION WITH LIPOFILLING TO BILATERAL CHEST;  Surgeon: Irene Limbo, MD;  Location: [place];  Service: Plastics;  Laterality: Bilateral;   MASTECTOMY W/ SENTINEL NODE BIOPSY Bilateral 12/11/2015   Procedure: RIGHT PROPHYLACTIC MASTECTOMY, LEFT TOTAL MASTECTOMY WITH LEFT AXILLARY SENTINEL LYMPH NODE BIOPSY;  Surgeon: Stark Klein, MD;  Location: [place];  Service: General;  Laterality: Bilateral;   OSTOMY N/A 11/19/2014   Procedure: OSTOMY;  Surgeon: Michael [name], MD;  [facility];  Service: General;  Laterality: N/A;   PROCTOSCOPY N/A 04/01/2017   Procedure: RIDGE PROCTOSCOPY;  Surgeon: Michael [name], MD;  [facility];  Service: General;  Laterality: N/A;   REMOVAL OF  BILATERAL TISSUE EXPANDERS WITH PLACEMENT OF BILATERAL BREAST IMPLANTS Bilateral 04/16/2016   Procedure: REMOVAL OF BILATERAL TISSUE EXPANDERS WITH PLACEMENT OF BILATERAL BREAST IMPLANTS;  Surgeon: Irene Limbo, MD;  Location: [place];  Service: Plastics;  Laterality: Bilateral;   ROBOTIC ASSISTED LAP VAGINAL HYSTERECTOMY N/A 11/19/2014   Procedure: ROBOTIC LYSIS OF ADHESIONS, CONVERTED TO LAPAROTOMY RADICAL UPPER VAGINECTOMY,LOW ANTERIOR BOWEL RESECTION, COLOSTOMY, BILATERAL URETERAL STENT PLACEMENT AND CYSTONOMY CLOSURE;  Surgeon: Everitt Amber, MD;  [facility];  Service: Gynecology;  Laterality: N/A;   TISSUE EXPANDER FILLING Bilateral 12/26/2015   Procedure: EXPANSION OF BILATERAL CHEST TISSUE EXPANDERS (60 mL- Right; 75 mL- Left);  Surgeon: Irene Limbo, MD;  Location: [place];  Service: Plastics;  Laterality: Bilateral;   TONSILLECTOMY     TOTAL ABDOMINAL  HYSTERECTOMY  March 2006   [facility]   and Bilateral Salpingoophorectomy/  staging for Ovarian cancer/  an   XI ROBOTIC ASSISTED LOWER ANTERIOR RESECTION N/A 04/01/2017   Procedure: XI ROBOTIC VS LAPAROSCOPIC COLOSTOMY TAKEDOWN WITH LYSIS OF ADHESIONS.;  Surgeon: Michael [name], MD;  [facility];  Service: General;  Laterality: N/A;  ERAS PATHWAY      Allergies: Penicillins; Cefaclor; Erythromycin; Tape; Trimethoprim; Ultram [tramadol]; Cephalosporins; Fluconazole; Oxycodone; Pectin; Septra [sulfamethoxazole-trimethoprim]; and Sulfa antibiotics  Medications: Prior to Admission medications   Medication Sig Start Date End Date Taking? Authorizing Provider  acetaminophen (TYLENOL) 325 MG tablet Take 2 tablets (650 mg total) by mouth every 6 (six) hours as needed. 09/24/17   Roxan Hockey, MD  anastrozole (ARIMIDEX) 1 MG tablet TAKE 1 TABLET DAILY Patient taking differently: Take 1 mg by mouth daily.  01/30/18   Heath Lark, MD  Biotin 5 MG TABS Take 5 mg by mouth every morning.     [provider]  buPROPion (WELLBUTRIN SR) 200 MG 12 hr tablet Take 1 tablet (200 mg total) by mouth daily. 12/07/18   Dennard Nip D, MD  Cholecalciferol (VITAMIN D3) 10000 UNITS capsule Take 10,000 Units by mouth once a week. Sunday evening's    [provider]  clobetasol (OLUX) 0.05 % topical foam Apply topically 2 (two) times daily.    [provider]  diphenhydrAMINE (BENADRYL) 25 mg capsule Take 1 capsule (25 mg total) by mouth every 8 (eight) hours as needed for itching, allergies or sleep. 09/24/17   Roxan Hockey, MD  diphenoxylate-atropine (LOMOTIL) 2.5-0.025 MG tablet Take 1 tablet by mouth 4 (four) times daily as needed for diarrhea or loose stools. TO PREVENT LOOSE BOWEL MOVEMENTS Patient not taking: Reported on 11/07/2018 09/24/17   Roxan Hockey, MD  fentaNYL (DURAGESIC) 50 MCG/HR Place 1 patch onto the skin every 3 (three) days. 11/21/18   [name] R, DO    ferrous sulfate 325 (65 FE) MG tablet Take 1 tablet (325 mg total) by mouth at bedtime. 09/24/17   Roxan Hockey, MD  filgrastim-sndz (ZARXIO) 480 MCG/0.8ML SOSY injection Inject 0.8 mLs (480 mcg total) into the skin as directed. Inject 480 mcg every 5 days 12/04/18 12/04/19  Heath Lark, MD  GRANIX 480 MCG/0.8ML SOSY injection Inject 480 mcg every 5 days indefinitely 12/01/18   Heath Lark, MD  HYDROcodone-acetaminophen (NORCO/VICODIN) 5-325 MG tablet Take 2 tablets by mouth every 6 (six) hours as needed for moderate pain.    [provider]  JANUVIA 50 MG tablet Take 50 mg by mouth daily.  10/28/17   [provider]  levothyroxine (SYNTHROID, LEVOTHROID) 150 MCG tablet Take 1 tablet (150 mcg  total) by mouth daily before breakfast. 09/25/17   Roxan Hockey, MD  loratadine (CLARITIN) 10 MG tablet Take 10 mg by mouth every morning.     [provider]  nitrofurantoin, macrocrystal-monohydrate, (MACROBID) 100 MG capsule Take 100 mg by mouth at bedtime.    [provider]  omega-3 acid ethyl esters (LOVAZA) 1 G capsule Take 1 g by mouth 2 (two) times daily.     [provider]  oxybutynin (DITROPAN) 5 MG tablet  07/11/18   [provider]  Polyethyl Glycol-Propyl Glycol (SYSTANE OP) Place 1 drop into both eyes daily as needed (dry eyes).     [provider]  pregabalin (LYRICA) 50 MG capsule TAKE 1 CAPSULE(50 MG) BY MOUTH TWICE DAILY 10/02/18   Ann Held, DO  Prenatal Vit-Fe Fumarate-FA (PRENATAL VITAMIN PO) Take 1 capsule by mouth daily. Takes prenatal because there are no dyes in it    [provider]  rosuvastatin (CRESTOR) 10 MG tablet Take 10 mg by mouth every evening.     [provider]  Saccharomyces boulardii (FLORASTOR PO) Take 1 capsule by mouth daily. 02/16/18   [provider]  sodium bicarbonate 650 MG tablet Take 650 mg by mouth 2 (two) times daily.    [provider]  sodium  chloride 0.9 % injection Inject 10 mLs into the vein daily.    [provider]     Vital Signs: BP 136/68    Pulse 68    Temp 98.3 F (36.8 C) (Oral)    Resp 16    SpO2 98%   Physical Exam awake, alert.  Chest clear to auscultation bilaterally.  Heart with regular rate and rhythm.  Abdomen soft, positive bowel sounds, ostomy intact; bilateral PCNs intact draining yellow to light brown urine.  Foley catheter in place draining yellow urine.  Trace pretibial edema bilaterally.  Imaging: Ct Abdomen Pelvis W Contrast  Result Date: 01/02/2019 CLINICAL DATA:  Lower abdominal and pelvic pain. Interstitial cystitis. Recurrent endometrial carcinoma and left breast carcinoma. EXAM: CT ABDOMEN AND PELVIS WITH CONTRAST TECHNIQUE: Multidetector CT imaging of the abdomen and pelvis was performed using the standard protocol following bolus administration of intravenous contrast. CONTRAST:  86m OMNIPAQUE IOHEXOL 300 MG/ML  SOLN COMPARISON:  Noncontrast CT on 10/04/2018 and 02/01/2018 FINDINGS: Lower Chest: No acute findings. Hepatobiliary: No hepatic masses identified. Prior cholecystectomy. No evidence of biliary obstruction. Pancreas:  No mass or inflammatory changes. Spleen: Within normal limits in size and appearance. Adrenals/Urinary Tract: Stable 1.7 cm left adrenal mass, consistent with benign adenoma. No evidence of renal masses. Right percutaneous nephrostomy tube is seen in appropriate position. The left percutaneous nephrostomy tube is in abnormal position located posterior to the renal collecting system in the midpole of the left kidney. Mild left hydronephrosis is increased since previous study. Foley catheter is seen within the urinary bladder which is nearly empty. 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 Stable post treatment changes are seen in the perirectal and presacral regions. No evidence of obstruction, inflammatory process or abnormal fluid collections. Vascular/Lymphatic: No  pathologically enlarged lymph nodes. No abdominal aortic aneurysm. Reproductive: Prior hysterectomy. No pelvic mass or abnormal free fluid identified. Other:  None. Musculoskeletal:  No suspicious bone lesions identified. IMPRESSION: 1. Increased mild left hydronephrosis, with left percutaneous nephrostomy tube in abnormal position located posterior to the renal collecting system. 2. Stable post treatment changes in pelvis. No evidence of recurrent or metastatic carcinoma within the abdomen or pelvis. Electronically Signed  By: Earle Gell M.D.   On: 01/02/2019 17:31    Labs:  CBC: Recent Labs    05/19/18 1127 09/28/18 1441 10/04/18 1345 11/29/18  WBC 4.4 2.0* 3.8* 4.0  HGB 10.4* 11.4* 11.2* 10.8*  HCT 35.1* 36.6 36.7 35*  PLT 350 206 282 273    COAGS: No results for input(s): INR, APTT in the last 8760 hours.  BMP: Recent Labs    03/27/18 1324 05/19/18 1127 09/28/18 1441 10/04/18 1345 11/29/18  NA 137 135 138 136 142  K 4.0 4.3 4.0 3.6 4.4  CL 104 104 104 106  --   CO2 26 22 22  20*  --   GLUCOSE 141* 183* 107* 132*  --   BUN 34* 38* 29* 36* 22*  CALCIUM 9.7 9.6 9.7 9.3  --   CREATININE 1.98* 1.90* 1.86* 1.94* 1.3*  GFRNONAA 26* 27* 28* 27*  --   GFRAA 30* 31* 33* 31*  --     LIVER FUNCTION TESTS: Recent Labs    03/27/18 1324 05/19/18 1127 09/28/18 1441 10/04/18 1345 11/29/18  BILITOT 0.2* 0.4 0.4 0.5  --   AST 14* 30 16 28  46*  ALT 18 54* 29 55* 70*  ALKPHOS 202* 364* 284* 550* 377*  PROT 8.3* 8.5* 8.6* 8.4*  --   ALBUMIN 3.0* 2.8* 3.2* 3.0*  --     Assessment and Plan: Patient with history of CKD,  breast cancer as well as ovarian cancer and recurrent endometrial cancer with prior low anterior resection/partial vaginectomy and colostomy in 2016.  She underwent colostomy takedown with loop ileostomy diversion in August 2018.  She also had robotic right ureteral reimplant/double-J stent with psoas bladder hitch in 2018 with stent removal on 08/17/2017.  She also  has a history of rectovesical fistula.  She underwent diverting bilateral percutaneous nephrostomies on 09/02/2017.  She has had chronic bilateral nephrostomies since with frequent exchanges and latest on 11/21/2018 secondary to malfunctioning left-sided tube.  This was attributable to withdrawal of the pigtail catheter into a calyx likely leading to at least partial obstruction/blockage.  She presents now with similar complaints of minimal output from left nephrostomy and is scheduled today for bilateral nephrostomy tube exchanges. Latest CT abdomen pelvis reveals increased mild left hydronephrosis with left nephrostomy tube in abnormal position located posterior to the renal collecting system.   Details/risks of procedures, including but not limited to, internal bleeding, infection, injury to adjacent structures discussed with patient with her understanding and consent.   Electronically Signed: D. Rowe Robert, PA-C 01/05/2019, 9:38 AM   I spent a total of 20 minutes  at the the patient's bedside AND on the patient's hospital floor or unit, greater than 50% of which was counseling/coordinating care for bilateral nephrostomy tube exchanges

## 2019-01-08 ENCOUNTER — Other Ambulatory Visit: Payer: Self-pay | Admitting: Family Medicine

## 2019-01-08 DIAGNOSIS — C55 Malignant neoplasm of uterus, part unspecified: Secondary | ICD-10-CM

## 2019-01-08 DIAGNOSIS — E860 Dehydration: Secondary | ICD-10-CM | POA: Diagnosis not present

## 2019-01-08 DIAGNOSIS — L89309 Pressure ulcer of unspecified buttock, unspecified stage: Secondary | ICD-10-CM | POA: Diagnosis not present

## 2019-01-08 DIAGNOSIS — E785 Hyperlipidemia, unspecified: Secondary | ICD-10-CM | POA: Diagnosis not present

## 2019-01-08 MED ORDER — FENTANYL 50 MCG/HR TD PT72: 1.0000 | MEDICATED_PATCH | TRANSDERMAL | 0 refills | Status: DC

## 2019-01-08 MED ORDER — HYDROCODONE-ACETAMINOPHEN 5-325 MG PO TABS: 2.0000 | ORAL_TABLET | Freq: Four times a day (QID) | ORAL | 0 refills | Status: DC | PRN

## 2019-01-08 NOTE — Telephone Encounter (Signed)
Which pharmacy?

## 2019-01-08 NOTE — Progress Notes (Signed)
Office: [phone]  /  Fax: [phone] TeleHealth Visit:  [name] has verbally consented to this TeleHealth visit today. The patient is located at home, the provider is located at the [employer] [facility] office. The participants in this visit include the listed provider and patient. [name] was unable to use realtime audiovisual technology today and the telehealth visit was conducted via telephone.   HPI:   Chief Complaint: OBESITY [name] is here to discuss her progress with her obesity treatment plan. She is on the keep a food journal with 1200-1300 calories and 75+ grams of protein daily and is following her eating plan approximately 85-90 % of the time. She states she is exercising 0 minutes 0 times per week. [name] has been having more nausea recently with her nephrostomy tube being in the wrong locations and causing more pain. She has increased simple carbohydrates like crackers and feels this helps with her nausea. She states CBGs are a little higher this morning at 109 (usually 70's-80's). We were unable to weigh the patient today for this TeleHealth visit. She feels as if she has gained 1-2 lbs since her last visit. She has lost 18 lbs since starting treatment with [place].  Depression with Emotional Eating Behaviors [name]'s mood has decreased due to frustration with health issues, but she still feels that Wellbutrin is helping her. She denies palpitations or worsening insomnia. [name] struggles with emotional eating and using food for comfort to the extent that it is negatively impacting her health. She often snacks when she is not hungry. [name] sometimes feels she is out of control and then feels guilty that she made poor food choices. She has been working on behavior modification techniques to help reduce her emotional eating and has been somewhat successful. She shows no sign of suicidal or homicidal ideations.  Depression screen [facility] 2/9 11/22/2018 11/07/2018 04/20/2018 04/05/2018  06/21/2017  Decreased Interest 0 0 0 1 0  Down, Depressed, Hopeless 0 0 1 1 0  PHQ - 2 Score 0 0 1 2 0  Altered sleeping - - 0 0 -  Tired, decreased energy - - 1 1 -  Change in appetite - - 0 1 -  Feeling bad or failure about yourself  - - 0 0 -  Trouble concentrating - - 0 0 -  Moving slowly or fidgety/restless - - 0 0 -  Suicidal thoughts - - 0 0 -  PHQ-9 Score - - 2 4 -  Difficult doing work/chores - - - Somewhat difficult -  Some recent data might be hidden    ASSESSMENT AND PLAN:  Other depression - with emotional eating  - Plan: buPROPion (WELLBUTRIN SR) 200 MG 12 hr tablet  Class 1 obesity with serious comorbidity and body mass index (BMI) of 34.0 to 34.9 in adult, unspecified obesity type  PLAN:  Depression with Emotional Eating Behaviors We discussed behavior modification techniques today to help [name] deal with her emotional eating and depression. Leeana agrees to continue taking Wellbutrin SR 200 mg qd #30 and we will refill for 1 month. Solara agrees to follow up with our clinic in 2 weeks.  I spent > than 50% of the 25 minute visit on counseling as documented in the note.  Obesity [name] is currently in the action stage of change. As such, her goal is to continue with weight loss efforts She has agreed to keep a food journal with 1200-1300 calories and 75+ grams of protein daily [name] has been  instructed to work up to a goal of 150 minutes of combined cardio and strengthening exercise per week for weight loss and overall health benefits. We discussed the following Behavioral Modification Strategies today: increasing lean protein intake, decreasing simple carbohydrates  and work on meal planning and easy cooking plans [name] agreed to keep working on getting back on track after her tube is replaced and she is feeling better.  [name] has agreed to follow up with our clinic in 2 weeks. She was informed of the importance of frequent follow up visits to maximize her success  with intensive lifestyle modifications for her multiple health conditions.  ALLERGIES: Allergies  Allergen Reactions  . Penicillins Swelling    Facial swelling/childhood allergy Has patient had a PCN reaction causing immediate rash, facial/tongue/throat swelling, SOB or lightheadedness with hypotension: Yes Has patient had a PCN reaction causing severe rash involving mucus membranes or skin necrosis: Yes Has patient had a PCN reaction that required hospitalization yes Has patient had a PCN reaction occurring within the last 10 years: No If all of the above answers are "NO", then may proceed with Cephalosporin use.   . Cefaclor Rash    Ceclor  . Erythromycin Other (See Comments)    Gastritis, abd cramps  . Tape Rash    blisters  . Trimethoprim Rash  . Ultram [Tramadol] Hives  . Cephalosporins Rash  . Fluconazole Rash  . Oxycodone Other (See Comments)    " I just feel weird"  . Pectin Rash    Pectin ring for stoma  . Septra [Sulfamethoxazole-Trimethoprim] Rash  . Sulfa Antibiotics Rash    MEDICATIONS: Current Outpatient Medications on File Prior to Visit  Medication Sig Dispense Refill  . acetaminophen (TYLENOL) 325 MG tablet Take 2 tablets (650 mg total) by mouth every 6 (six) hours as needed. 30 tablet 1  . anastrozole (ARIMIDEX) 1 MG tablet TAKE 1 TABLET DAILY (Patient taking differently: Take 1 mg by mouth daily. ) 90 tablet 3  . Biotin 5 MG TABS Take 5 mg by mouth every morning.     [name] Kitchen buPROPion (WELLBUTRIN SR) 200 MG 12 hr tablet Take 1 tablet (200 mg total) by mouth daily. 30 tablet 0  . Cholecalciferol (VITAMIN D3) 10000 UNITS capsule Take 10,000 Units by mouth once a week. Sunday evening's    . clobetasol (OLUX) 0.05 % topical foam Apply topically 2 (two) times daily.    . diphenhydrAMINE (BENADRYL) 25 mg capsule Take 1 capsule (25 mg total) by mouth every 8 (eight) hours as needed for itching, allergies or sleep. 30 capsule 0  . diphenoxylate-atropine (LOMOTIL)  2.5-0.025 MG tablet Take 1 tablet by mouth 4 (four) times daily as needed for diarrhea or loose stools. TO PREVENT LOOSE BOWEL MOVEMENTS 30 tablet 0  . fentaNYL (DURAGESIC) 50 MCG/HR Place 1 patch onto the skin every 3 (three) days. 10 patch 0  . ferrous sulfate 325 (65 FE) MG tablet Take 1 tablet (325 mg total) by mouth at bedtime. 30 tablet 3  . filgrastim-sndz (ZARXIO) 480 MCG/0.8ML SOSY injection Inject 0.8 mLs (480 mcg total) into the skin as directed. Inject 480 mcg every 5 days 0.8 mL 99  . HYDROcodone-acetaminophen (NORCO/VICODIN) 5-325 MG tablet Take 2 tablets by mouth every 6 (six) hours as needed for moderate pain.    [name] Kitchen JANUVIA 50 MG tablet Take 50 mg by mouth daily.     [name] Kitchen levothyroxine (SYNTHROID, LEVOTHROID) 150 MCG tablet Take 1 tablet (150 mcg total) by mouth daily before  breakfast. 30 tablet 1  . loratadine (CLARITIN) 10 MG tablet Take 10 mg by mouth every morning.     . nitrofurantoin, macrocrystal-monohydrate, (MACROBID) 100 MG capsule Take 100 mg by mouth at bedtime.    [name] Kitchen omega-3 acid ethyl esters (LOVAZA) 1 G capsule Take 1 g by mouth 2 (two) times daily.     [name] Kitchen oxybutynin (DITROPAN) 5 MG tablet     . Polyethyl Glycol-Propyl Glycol (SYSTANE OP) Place 1 drop into both eyes daily as needed (dry eyes).     . pregabalin (LYRICA) 50 MG capsule TAKE 1 CAPSULE(50 MG) BY MOUTH TWICE DAILY 180 capsule 3  . Prenatal Vit-Fe Fumarate-FA (PRENATAL VITAMIN PO) Take 1 capsule by mouth daily. Takes prenatal because there are no dyes in it    . rosuvastatin (CRESTOR) 10 MG tablet Take 10 mg by mouth every evening.     . Saccharomyces boulardii (FLORASTOR PO) Take 1 capsule by mouth daily.     No current facility-administered medications on file prior to visit.     PAST MEDICAL HISTORY: Past Medical History:  Diagnosis Date  . Adrenal adenoma, left 02/08/2016   CT: stable benign  . Anemia in neoplastic disease   . Back pain   . Benign essential HTN   . 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 Cancer of corpus uteri, except isthmus [facility])  oncologist-- dr Denman [name] and dr Alvy Bimler    10-15-2004  dx endometroid endometrial and ovarian cancer s/p  chemotheapy and surgery(TAH w/ BSO) :  recurrent 11-19-2014 post pelvic surgery and radiation 01-29-2015 to 03-10-2015  . Chronic idiopathic neutropenia (HCC)    presumed related to chemotherapy March 2006--- followed by dr Alvy Bimler (treatment w/ G-CSF injections  . Chronic nausea   . Chronic pain    perineal/ anal  area from bladder pad irritates skin , right flank pain  . CKD stage G2/A3, GFR 60-89 and albumin creatinine ratio >300 mg/g    nephrologist-  dr Madelon Lips  . Colovesical fistula   . Diabetic retinopathy, background ([place])   . Difficult intravenous access    small veins--- hx PICC lines  . DM type 2 (diabetes mellitus, type 2) ([name])    monitored by dr Legrand Como altheimer  . Dysuria   . Environmental and seasonal allergies   . Fatty liver 02/08/2016   CT  . Generalized muscle weakness   . GERD (gastroesophageal reflux disease)   . Hiatal hernia   . History of abdominal abscess 04/16/2017   post surgery 04-01-2017  --- resolved 10/ 2018  . History of gastric polyp    2014  duodenum  . History of ileus 04/16/2017   resolved w/ no surgical intervention  . History of radiation therapy    01-29-2015 to 03-10-2015  pelvis 50.4Gy  . Hypothyroidism    monitored by dr Legrand Como altheimer  . IBS (irritable bowel syndrome)   . Ileostomy in place [facility]) 04/01/2017   created at same time colostomy takedown.  . Joint pain   . Leg edema   . Lower urinary tract symptoms (LUTS)    urge urinary  incontinence  . Mixed dyslipidemia   . Multiple thyroid nodules    Managed by Dr. Harlow Asa  . Nephrostomy status ([place])   . Palpitations   .  Pelvic abscess in female  04/16/2017  . PONV (postoperative nausea and vomiting)    "scopolamine patch works for me"  . Radiation-induced dermatitis    contact dermatitis , radiation completed, rash only on ankles now.  . Seasonal allergies   . Ureteral stricture, right UROLOGIT-  DR [facility]   CHRONIC--  TREATMENT URETERAL STENT  . 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 Vitamin D deficiency   . Wears glasses     PAST SURGICAL HISTORY: Past Surgical History:  Procedure Laterality Date  . APPENDECTOMY    . biopsy thyroid nodules    . BREAST RECONSTRUCTION WITH PLACEMENT OF TISSUE EXPANDER AND FLEX HD (ACELLULAR HYDRATED DERMIS) Bilateral 12/11/2015   Procedure: BILATERAL BREAST RECONSTRUCTION WITH PLACEMENT OF TISSUE EXPANDERS;  Surgeon: Irene Limbo, MD;  Location: [place];  Service: Plastics;  Laterality: Bilateral;  . COLONOSCOPY WITH PROPOFOL N/A 08/21/2013   Procedure: COLONOSCOPY WITH PROPOFOL;  Surgeon: Cleotis Nipper, MD;  [facility];  Service: Endoscopy;  Laterality: N/A;  . COLOSTOMY TAKEDOWN N/A 12/04/2014   Procedure: LAPROSCOPIC LYSIS OF ADHESIONS, SPLENIC MOBILIZATION, RELOCATION OF COLOSTOMY, DEBRIDEMENT INITIAL COLOSTOMY SITE;  Surgeon: Michael [name], MD;  [facility];  Service: General;  Laterality: N/A;  . CYSTOGRAM N/A 06/01/2017   Procedure: CYSTOGRAM;  Surgeon: Alexis Frock, MD;  [facility];  Service: Urology;  Laterality: N/A;  . CYSTOSCOPY W/ RETROGRADES Right 11/21/2015   Procedure: CYSTOSCOPY WITH RETROGRADE PYELOGRAM;  Surgeon: Alexis Frock, MD;  [facility];  Service: Urology;  Laterality: Right;  . CYSTOSCOPY W/ URETERAL STENT PLACEMENT Right 11/21/2015   Procedure: CYSTOSCOPY WITH STENT REPLACEMENT;  Surgeon: Alexis Frock, MD;  [facility];  Service: Urology;  Laterality: Right;  . CYSTOSCOPY W/ URETERAL STENT PLACEMENT Right 03/10/2016   Procedure: CYSTOSCOPY WITH STENT REPLACEMENT;  Surgeon: Alexis Frock, MD;   Location[facility];  Service: Urology;  Laterality: Right;  . CYSTOSCOPY W/ URETERAL STENT PLACEMENT Right 06/30/2016   Procedure: CYSTOSCOPY WITH RETROGRADE PYELOGRAM/URETERAL STENT EXCHANGE;  Surgeon: Alexis Frock, MD;  Location: [facility];  Service: Urology;  Laterality: Right;  . CYSTOSCOPY W/ URETERAL STENT PLACEMENT N/A 06/01/2017   Procedure: CYSTOSCOPY WITH EXAM UNDER ANESTHESIA;  Surgeon: Alexis Frock, MD;  [facility];  Service: Urology;  Laterality: N/A;  . CYSTOSCOPY W/ URETERAL STENT PLACEMENT Right 08/17/2017   Procedure: CYSTOSCOPY WITH RETROGRADE PYELOGRAM/URETERAL STENT REMOVAL;  Surgeon: Alexis Frock, MD;  Location: [facility];  Service: Urology;  Laterality: Right;  . CYSTOSCOPY WITH RETROGRADE PYELOGRAM, URETEROSCOPY AND STENT PLACEMENT Right 03/20/2015   Procedure: CYSTOSCOPY WITH RETROGRADE PYELOGRAM, URETEROSCOPY WITH BALLOON DILATION AND STENT PLACEMENT ON RIGHT;  Surgeon: Alexis Frock, MD;  Location: [facility];  Service: Urology;  Laterality: Right;  . CYSTOSCOPY WITH RETROGRADE PYELOGRAM, URETEROSCOPY AND STENT PLACEMENT Right 05/02/2015   Procedure: CYSTOSCOPY WITH RIGHT RETROGRADE PYELOGRAM,  DIAGNOSTIC URETEROSCOPY AND STENT PULL ;  Surgeon: Alexis Frock, MD;  Location[facility];  Service: Urology;  Laterality: Right;  . CYSTOSCOPY WITH RETROGRADE PYELOGRAM, URETEROSCOPY AND STENT PLACEMENT Right 09/05/2015   Procedure: CYSTOSCOPY WITH RETROGRADE PYELOGRAM,  AND STENT PLACEMENT;  Surgeon: Alexis Frock, MD;  [facility];  Service: Urology;  Laterality: Right;  . CYSTOSCOPY WITH RETROGRADE PYELOGRAM, URETEROSCOPY AND STENT PLACEMENT Right 04/01/2017   Procedure: CYSTOSCOPY WITH RETROGRADE PYELOGRAM, URETEROSCOPY AND STENT PLACEMENT;  Surgeon: Alexis Frock, MD;  [facility];  Service: Urology;  Laterality: Right;  . CYSTOSCOPY WITH STENT PLACEMENT Right 10/27/2016  Procedure: CYSTOSCOPY WITH STENT CHANGE and right retrograde pyelogram;  Surgeon: Alexis Frock, MD;  Location: [facility];  Service: Urology;  Laterality: Right;  . EUS N/A 10/02/2014   Procedure: LOWER ENDOSCOPIC ULTRASOUND (EUS);  Surgeon: Arta Silence, MD;  Location: [facility];  Service: Endoscopy;  Laterality: N/A;  . EXCISION SOFT TISSUE MASS RIGHT FOREMAN  12-08-2006  . EYE SURGERY  as child   pytosis of eyelids repair  . INCISION AND DRAINAGE OF WOUND Bilateral 12/26/2015   Procedure: DEBRIDEMENT OF BILATERAL MASTECTOMY FLAPS;  Surgeon: Irene Limbo, MD;  Location: [place];  Service: Plastics;  Laterality: Bilateral;  . IR CV LINE INJECTION  05/31/2017  . IR FLUORO GUIDE CV LINE LEFT  05/31/2017  . IR FLUORO GUIDE CV LINE RIGHT  04/06/2017  . IR FLUORO GUIDE CV MIDLINE PICC RIGHT  05/30/2017  . IR NEPHROSTOGRAM LEFT INITIAL PLACEMENT  09/02/2017  . IR NEPHROSTOGRAM LEFT THRU EXISTING ACCESS  11/29/2017  . IR NEPHROSTOGRAM RIGHT INITIAL PLACEMENT  09/02/2017  . IR NEPHROSTOGRAM RIGHT THRU EXISTING ACCESS  09/13/2017  . IR NEPHROSTOGRAM RIGHT THRU EXISTING ACCESS  11/29/2017  . IR NEPHROSTOMY EXCHANGE LEFT  11/28/2017  . IR NEPHROSTOMY EXCHANGE LEFT  01/05/2018  . IR NEPHROSTOMY EXCHANGE LEFT  02/16/2018  . IR NEPHROSTOMY EXCHANGE LEFT  03/30/2018  . IR NEPHROSTOMY EXCHANGE LEFT  05/12/2018  . IR NEPHROSTOMY EXCHANGE LEFT  06/21/2018  . IR NEPHROSTOMY EXCHANGE LEFT  08/04/2018  . IR NEPHROSTOMY EXCHANGE LEFT  09/18/2018  . IR NEPHROSTOMY EXCHANGE LEFT  10/09/2018  . IR NEPHROSTOMY EXCHANGE LEFT  10/27/2018  . IR NEPHROSTOMY EXCHANGE LEFT  11/21/2018  . IR NEPHROSTOMY EXCHANGE LEFT  01/05/2019  . IR NEPHROSTOMY EXCHANGE RIGHT  10/02/2017  . IR NEPHROSTOMY EXCHANGE RIGHT  11/28/2017  . IR NEPHROSTOMY EXCHANGE RIGHT  01/05/2018  . IR NEPHROSTOMY EXCHANGE RIGHT  02/16/2018  . IR NEPHROSTOMY EXCHANGE RIGHT  03/30/2018  . IR NEPHROSTOMY EXCHANGE RIGHT  05/12/2018  . IR  NEPHROSTOMY EXCHANGE RIGHT  06/21/2018  . IR NEPHROSTOMY EXCHANGE RIGHT  08/04/2018  . IR NEPHROSTOMY EXCHANGE RIGHT  09/18/2018  . IR NEPHROSTOMY EXCHANGE RIGHT  10/27/2018  . IR NEPHROSTOMY EXCHANGE RIGHT  11/21/2018  . IR NEPHROSTOMY EXCHANGE RIGHT  01/05/2019  . IR NEPHROSTOMY PLACEMENT LEFT  10/02/2017  . IR RADIOLOGIST EVAL & MGMT  05/03/2017  . IR US GUIDE VASC ACCESS LEFT  05/31/2017  . IR US GUIDE VASC ACCESS RIGHT  04/06/2017  . IR US GUIDE VASC ACCESS RIGHT  05/30/2017  . LAPAROSCOPIC CHOLECYSTECTOMY  1990  . LIPOSUCTION WITH LIPOFILLING Bilateral 04/16/2016   Procedure: LIPOSUCTION WITH LIPOFILLING TO BILATERAL CHEST;  Surgeon: Irene Limbo, MD;  Location: [place];  Service: Plastics;  Laterality: Bilateral;  . MASTECTOMY W/ SENTINEL NODE BIOPSY Bilateral 12/11/2015   Procedure: RIGHT PROPHYLACTIC MASTECTOMY, LEFT TOTAL MASTECTOMY WITH LEFT AXILLARY SENTINEL LYMPH NODE BIOPSY;  Surgeon: Stark Klein, MD;  Location: [place];  Service: General;  Laterality: Bilateral;  . OSTOMY N/A 11/19/2014   Procedure: OSTOMY;  Surgeon: Michael [name], MD;  [facility];  Service: General;  Laterality: N/A;  . PROCTOSCOPY N/A 04/01/2017   Procedure: RIDGE PROCTOSCOPY;  Surgeon: Michael [name], MD;  [facility];  Service: General;  Laterality: N/A;  . REMOVAL OF BILATERAL TISSUE EXPANDERS WITH PLACEMENT OF BILATERAL BREAST IMPLANTS Bilateral 04/16/2016   Procedure: REMOVAL OF BILATERAL TISSUE EXPANDERS WITH PLACEMENT OF BILATERAL BREAST IMPLANTS;  Surgeon: Irene Limbo, MD;  Location: [place];  Service: Plastics;  Laterality: Bilateral;  . ROBOTIC ASSISTED LAP VAGINAL HYSTERECTOMY N/A 11/19/2014   Procedure: ROBOTIC LYSIS OF ADHESIONS, CONVERTED TO LAPAROTOMY RADICAL UPPER VAGINECTOMY,LOW ANTERIOR BOWEL RESECTION, COLOSTOMY, BILATERAL URETERAL STENT PLACEMENT AND CYSTONOMY CLOSURE;  Surgeon: Everitt Amber, MD;  [facility];  Service: Gynecology;  Laterality: N/A;  .  TISSUE EXPANDER FILLING Bilateral 12/26/2015   Procedure: EXPANSION OF BILATERAL CHEST TISSUE EXPANDERS (60 mL- Right; 75 mL- Left);  Surgeon: Irene Limbo, MD;  Location: [place];  Service: Plastics;  Laterality: Bilateral;  . TONSILLECTOMY    . TOTAL ABDOMINAL HYSTERECTOMY  March 2006   [facility]   and Bilateral Salpingoophorectomy/  staging for Ovarian cancer/  an  . XI ROBOTIC ASSISTED LOWER ANTERIOR RESECTION N/A 04/01/2017   Procedure: XI ROBOTIC VS LAPAROSCOPIC COLOSTOMY TAKEDOWN WITH LYSIS OF ADHESIONS.;  Surgeon: Michael [name], MD;  [facility];  Service: General;  Laterality: N/A;  ERAS PATHWAY    SOCIAL HISTORY: Social History   Tobacco Use  . Smoking status: Never Smoker  . Smokeless tobacco: Never Used  Substance Use Topics  . Alcohol use: Not Currently  . Drug use: No    FAMILY HISTORY: Family History  Problem Relation Age of Onset  . Cancer Mother 67       stomach ca  . Hypertension Mother   . Cancer Father 37       prostate ca  . Diabetes Father   . Heart disease Father        CABG  . Hypertension Father   . Hyperlipidemia Father   . Obesity Father   . Breast cancer Maternal Aunt        dx in her 41s  . Lymphoma Paternal Aunt   . Brain cancer Paternal Grandfather   . Ovarian cancer Other   . Diabetes Sister   . Hypertension Brother y-10  . Heart disease Brother        CABG  . Diabetes Brother     ROS: Review of Systems  Constitutional: Negative for weight loss.  Cardiovascular: Negative for palpitations.  Gastrointestinal: Positive for nausea.  Psychiatric/Behavioral: Positive for depression. Negative for suicidal ideas. The patient does not have insomnia.     PHYSICAL EXAM: Pt in no acute distress  RECENT LABS AND TESTS: BMET    Component Value Date/Time   NA 139 01/05/2019 1010   NA 142 11/29/2018   NA 141 01/24/2017 1228   K 4.1 01/05/2019 1010   K 4.6 01/24/2017 1228   CL 112 (H) 01/05/2019 1010   CO2 21 (L)  01/05/2019 1010   CO2 29 01/24/2017 1228   GLUCOSE 103 (H) 01/05/2019 1010   GLUCOSE 84 01/24/2017 1228   BUN 35 (H) 01/05/2019 1010   BUN 22 (A) 11/29/2018   BUN 22.2 01/24/2017 1228   CREATININE 1.60 (H) 01/05/2019 1010   CREATININE 1.98 (H) 03/27/2018 1324   CREATININE 0.8 01/24/2017 1228   CALCIUM 9.2 01/05/2019 1010   CALCIUM 10.2 01/24/2017 1228   GFRNONAA 34 (L) 01/05/2019 1010   GFRNONAA 26 (L) 03/27/2018 1324   GFRNONAA 78 12/16/2014 1530   GFRAA 39 (L) 01/05/2019 1010   GFRAA 30 (L) 03/27/2018 1324   GFRAA >89 12/16/2014 1530   Lab Results  Component Value Date   HGBA1C 7.9 (H) 09/01/2017   HGBA1C 5.3 04/01/2017   HGBA1C 5.8 (H) 09/05/2015   HGBA1C 6.8 (H) 11/21/2014   HGBA1C 6.3 (H) 11/07/2014  No results found for: INSULIN CBC    Component Value Date/Time   WBC 4.6 01/05/2019 1010   RBC 3.72 (L) 01/05/2019 1010   HGB 11.1 (L) 01/05/2019 1010   HGB 10.2 (L) 03/27/2018 1324   HGB 12.4 07/29/2017 1444   HCT 36.6 01/05/2019 1010   HCT 38.0 07/29/2017 1444   PLT 259 01/05/2019 1010   PLT 268 03/27/2018 1324   PLT 260 07/29/2017 1444   MCV 98.4 01/05/2019 1010   MCV 90.9 07/29/2017 1444   MCH 29.8 01/05/2019 1010   MCHC 30.3 01/05/2019 1010   RDW 14.4 01/05/2019 1010   RDW 15.5 (H) 07/29/2017 1444   LYMPHSABS 0.6 (L) 01/05/2019 1010   LYMPHSABS 0.8 (L) 07/29/2017 1444   MONOABS 0.5 01/05/2019 1010   MONOABS 1.0 (H) 07/29/2017 1444   EOSABS 0.7 (H) 01/05/2019 1010   EOSABS 0.0 07/29/2017 1444   BASOSABS 0.0 01/05/2019 1010   BASOSABS 0.0 07/29/2017 1444   Iron/TIBC/Ferritin/ %Sat    Component Value Date/Time   IRON 7 (L) 08/31/2017 0451   IRON 12 (L) 11/29/2014 1251   TIBC 164 (L) 08/31/2017 0451   TIBC 331 11/29/2014 1251   FERRITIN 27 11/29/2014 1251   IRONPCTSAT 4 (L) 08/31/2017 0451   IRONPCTSAT 4 (L) 11/29/2014 1251   Lipid Panel     Component Value Date/Time   CHOL 109 08/28/2015 1617   TRIG 89 04/18/2017 0427   HDL 43.80 08/28/2015  1617   CHOLHDL 2 08/28/2015 1617   VLDL 19.8 08/28/2015 1617   LDLCALC 45 08/28/2015 1617   Hepatic Function Panel     Component Value Date/Time   PROT 8.4 (H) 10/04/2018 1345   PROT 6.9 01/24/2017 1228   ALBUMIN 3.0 (L) 10/04/2018 1345   ALBUMIN 3.4 (L) 01/24/2017 1228   AST 46 (A) 11/29/2018   AST 14 (L) 03/27/2018 1324   AST 16 01/24/2017 1228   ALT 70 (A) 11/29/2018   ALT 18 03/27/2018 1324   ALT 14 01/24/2017 1228   ALKPHOS 377 (A) 11/29/2018   ALKPHOS 90 01/24/2017 1228   BILITOT 0.5 10/04/2018 1345   BILITOT 0.2 (L) 03/27/2018 1324   BILITOT 0.34 01/24/2017 1228      Component Value Date/Time   TSH 2.408 08/30/2017 0623   TSH 0.63 08/28/2015 1617      I, Trixie Dredge, am acting as transcriptionist for Dennard Nip, MD I have reviewed the above documentation for accuracy and completeness, and I agree with the above. -Dennard Nip, MD

## 2019-01-09 ENCOUNTER — Telehealth: Payer: Self-pay | Admitting: *Deleted

## 2019-01-09 ENCOUNTER — Telehealth: Payer: Self-pay | Admitting: Internal Medicine

## 2019-01-09 MED ORDER — BUPROPION HCL ER (SR) 200 MG PO TB12: 200.0000 mg | ORAL_TABLET | Freq: Every day | ORAL | 0 refills | Status: DC

## 2019-01-09 NOTE — Telephone Encounter (Signed)
I spoke to [name] today.  She had a repeat CT scan on 01/02/2019.  Her left percutaneous nephrostomy tube was noted to be out of of the collecting system.  She had slight increase in her left hydronephrosis.  Both percutaneous nephrostomy tubes were replaced on 01/05/2019.  Over the past few days she has had increased flank pain, left greater than right, supra pubic "pressure" and intermittent fevers up to 102.8 degrees.  Urine cultures done from her nephrostomy tubes have grown Acinetobacter again, enterococcus and multiple other species.  I discussed options with her and we have decided to restart meropenem and reevaluate in 1 week.

## 2019-01-09 NOTE — Telephone Encounter (Signed)
Faxed written orders to [name] for IV meropenem 1 gm q 12 hours for 7 days per Dr Megan Salon. Landis Gandy, RN

## 2019-01-10 DIAGNOSIS — T82594D Other mechanical complication of infusion catheter, subsequent encounter: Secondary | ICD-10-CM | POA: Diagnosis not present

## 2019-01-10 DIAGNOSIS — T83592A Infection and inflammatory reaction due to indwelling ureteral stent, initial encounter: Secondary | ICD-10-CM | POA: Diagnosis not present

## 2019-01-10 DIAGNOSIS — E860 Dehydration: Secondary | ICD-10-CM | POA: Diagnosis not present

## 2019-01-10 DIAGNOSIS — L89309 Pressure ulcer of unspecified buttock, unspecified stage: Secondary | ICD-10-CM | POA: Diagnosis not present

## 2019-01-10 DIAGNOSIS — E785 Hyperlipidemia, unspecified: Secondary | ICD-10-CM | POA: Diagnosis not present

## 2019-01-15 ENCOUNTER — Inpatient Hospital Stay (HOSPITAL_COMMUNITY): Admission: RE | Admit: 2019-01-15 | Payer: Self-pay | Source: Ambulatory Visit

## 2019-01-15 ENCOUNTER — Other Ambulatory Visit (HOSPITAL_COMMUNITY): Payer: BLUE CROSS/BLUE SHIELD

## 2019-01-15 ENCOUNTER — Ambulatory Visit (HOSPITAL_COMMUNITY): Payer: BLUE CROSS/BLUE SHIELD

## 2019-01-15 DIAGNOSIS — E785 Hyperlipidemia, unspecified: Secondary | ICD-10-CM | POA: Diagnosis not present

## 2019-01-15 DIAGNOSIS — E860 Dehydration: Secondary | ICD-10-CM | POA: Diagnosis not present

## 2019-01-15 DIAGNOSIS — L89309 Pressure ulcer of unspecified buttock, unspecified stage: Secondary | ICD-10-CM | POA: Diagnosis not present

## 2019-01-16 ENCOUNTER — Telehealth: Payer: Self-pay | Admitting: *Deleted

## 2019-01-16 ENCOUNTER — Telehealth: Payer: Self-pay

## 2019-01-16 DIAGNOSIS — F329 Major depressive disorder, single episode, unspecified: Secondary | ICD-10-CM | POA: Insufficient documentation

## 2019-01-16 DIAGNOSIS — F32A Depression, unspecified: Secondary | ICD-10-CM | POA: Insufficient documentation

## 2019-01-16 NOTE — Telephone Encounter (Signed)
Patient called regarding her status, IV antibiotics.  She has 1, maybe 2, more doses of merrem at home. She feels mostly better.  She has some occasional pressure, no fever, no bleeding, no kidney pain. She is having abdominal pain, worried it involves her ileostomy, has a call to her primary care regarding this.  Surgery date at [facility] 9/10.  Landis Gandy, RN

## 2019-01-16 NOTE — Telephone Encounter (Signed)
Copied from [place] [id number]. Topic: General - Other >> Jan 16, 2019  1:10 PM Keene Breath wrote: Reason for CRM: Patient called to inform the doctor that she has been having abdominal pain and vomiting.  She believes it might be a partial or complete bowel obstruction.  Tried to reach [facility] but they were busy.  Patient said that she just wanted the doctor to know and if it got worse she would seek immediate care.  She would prefer a call back to ask doctor what she would recommend.  CB# [phone]

## 2019-01-17 ENCOUNTER — Other Ambulatory Visit: Payer: Self-pay

## 2019-01-17 ENCOUNTER — Inpatient Hospital Stay (HOSPITAL_COMMUNITY): Payer: BC Managed Care – PPO

## 2019-01-17 ENCOUNTER — Inpatient Hospital Stay (HOSPITAL_COMMUNITY)
Admission: EM | Admit: 2019-01-17 | Discharge: 2019-01-22 | DRG: 390 | Disposition: A | Discharge status: Home Health | Payer: BC Managed Care – PPO | Attending: Internal Medicine | Admitting: Internal Medicine

## 2019-01-17 ENCOUNTER — Emergency Department (HOSPITAL_COMMUNITY): Payer: BC Managed Care – PPO

## 2019-01-17 ENCOUNTER — Encounter (HOSPITAL_COMMUNITY): Payer: Self-pay | Admitting: Emergency Medicine

## 2019-01-17 DIAGNOSIS — Z1159 Encounter for screening for other viral diseases: Secondary | ICD-10-CM | POA: Diagnosis not present

## 2019-01-17 DIAGNOSIS — Z03818 Encounter for observation for suspected exposure to other biological agents ruled out: Secondary | ICD-10-CM | POA: Diagnosis not present

## 2019-01-17 DIAGNOSIS — Z79890 Hormone replacement therapy (postmenopausal): Secondary | ICD-10-CM

## 2019-01-17 DIAGNOSIS — K449 Diaphragmatic hernia without obstruction or gangrene: Secondary | ICD-10-CM | POA: Diagnosis not present

## 2019-01-17 DIAGNOSIS — K5669 Other partial intestinal obstruction: Secondary | ICD-10-CM | POA: Diagnosis not present

## 2019-01-17 DIAGNOSIS — Z79899 Other long term (current) drug therapy: Secondary | ICD-10-CM

## 2019-01-17 DIAGNOSIS — E860 Dehydration: Secondary | ICD-10-CM | POA: Diagnosis not present

## 2019-01-17 DIAGNOSIS — N132 Hydronephrosis with renal and ureteral calculous obstruction: Secondary | ICD-10-CM | POA: Diagnosis not present

## 2019-01-17 DIAGNOSIS — Z9882 Breast implant status: Secondary | ICD-10-CM

## 2019-01-17 DIAGNOSIS — E11319 Type 2 diabetes mellitus with unspecified diabetic retinopathy without macular edema: Secondary | ICD-10-CM | POA: Diagnosis not present

## 2019-01-17 DIAGNOSIS — K56609 Unspecified intestinal obstruction, unspecified as to partial versus complete obstruction: Principal | ICD-10-CM | POA: Diagnosis present

## 2019-01-17 DIAGNOSIS — I129 Hypertensive chronic kidney disease with stage 1 through stage 4 chronic kidney disease, or unspecified chronic kidney disease: Secondary | ICD-10-CM | POA: Diagnosis not present

## 2019-01-17 DIAGNOSIS — Z853 Personal history of malignant neoplasm of breast: Secondary | ICD-10-CM | POA: Diagnosis not present

## 2019-01-17 DIAGNOSIS — E039 Hypothyroidism, unspecified: Secondary | ICD-10-CM | POA: Diagnosis present

## 2019-01-17 DIAGNOSIS — E1122 Type 2 diabetes mellitus with diabetic chronic kidney disease: Secondary | ICD-10-CM | POA: Diagnosis present

## 2019-01-17 DIAGNOSIS — Z0189 Encounter for other specified special examinations: Secondary | ICD-10-CM

## 2019-01-17 DIAGNOSIS — Z90710 Acquired absence of both cervix and uterus: Secondary | ICD-10-CM

## 2019-01-17 DIAGNOSIS — L89309 Pressure ulcer of unspecified buttock, unspecified stage: Secondary | ICD-10-CM | POA: Diagnosis not present

## 2019-01-17 DIAGNOSIS — Z932 Ileostomy status: Secondary | ICD-10-CM

## 2019-01-17 DIAGNOSIS — Z9049 Acquired absence of other specified parts of digestive tract: Secondary | ICD-10-CM | POA: Diagnosis not present

## 2019-01-17 DIAGNOSIS — E1129 Type 2 diabetes mellitus with other diabetic kidney complication: Secondary | ICD-10-CM

## 2019-01-17 DIAGNOSIS — Z833 Family history of diabetes mellitus: Secondary | ICD-10-CM

## 2019-01-17 DIAGNOSIS — K56600 Partial intestinal obstruction, unspecified as to cause: Secondary | ICD-10-CM | POA: Diagnosis not present

## 2019-01-17 DIAGNOSIS — Z8543 Personal history of malignant neoplasm of ovary: Secondary | ICD-10-CM | POA: Diagnosis not present

## 2019-01-17 DIAGNOSIS — Z7984 Long term (current) use of oral hypoglycemic drugs: Secondary | ICD-10-CM

## 2019-01-17 DIAGNOSIS — Z9221 Personal history of antineoplastic chemotherapy: Secondary | ICD-10-CM

## 2019-01-17 DIAGNOSIS — Z170 Estrogen receptor positive status [ER+]: Secondary | ICD-10-CM

## 2019-01-17 DIAGNOSIS — Y732 Prosthetic and other implants, materials and accessory gastroenterology and urology devices associated with adverse incidents: Secondary | ICD-10-CM | POA: Diagnosis not present

## 2019-01-17 DIAGNOSIS — G8929 Other chronic pain: Secondary | ICD-10-CM | POA: Diagnosis present

## 2019-01-17 DIAGNOSIS — Z9013 Acquired absence of bilateral breasts and nipples: Secondary | ICD-10-CM | POA: Diagnosis not present

## 2019-01-17 DIAGNOSIS — K219 Gastro-esophageal reflux disease without esophagitis: Secondary | ICD-10-CM | POA: Diagnosis present

## 2019-01-17 DIAGNOSIS — Z4659 Encounter for fitting and adjustment of other gastrointestinal appliance and device: Secondary | ICD-10-CM

## 2019-01-17 DIAGNOSIS — E785 Hyperlipidemia, unspecified: Secondary | ICD-10-CM | POA: Diagnosis not present

## 2019-01-17 DIAGNOSIS — Z8542 Personal history of malignant neoplasm of other parts of uterus: Secondary | ICD-10-CM | POA: Diagnosis not present

## 2019-01-17 DIAGNOSIS — N183 Chronic kidney disease, stage 3 (moderate): Secondary | ICD-10-CM | POA: Diagnosis present

## 2019-01-17 DIAGNOSIS — D649 Anemia, unspecified: Secondary | ICD-10-CM | POA: Diagnosis present

## 2019-01-17 DIAGNOSIS — K760 Fatty (change of) liver, not elsewhere classified: Secondary | ICD-10-CM | POA: Diagnosis present

## 2019-01-17 DIAGNOSIS — Z923 Personal history of irradiation: Secondary | ICD-10-CM

## 2019-01-17 DIAGNOSIS — Z936 Other artificial openings of urinary tract status: Secondary | ICD-10-CM

## 2019-01-17 DIAGNOSIS — E782 Mixed hyperlipidemia: Secondary | ICD-10-CM | POA: Diagnosis not present

## 2019-01-17 DIAGNOSIS — J9811 Atelectasis: Secondary | ICD-10-CM | POA: Diagnosis not present

## 2019-01-17 DIAGNOSIS — Z79811 Long term (current) use of aromatase inhibitors: Secondary | ICD-10-CM

## 2019-01-17 DIAGNOSIS — Z4682 Encounter for fitting and adjustment of non-vascular catheter: Secondary | ICD-10-CM | POA: Diagnosis not present

## 2019-01-17 DIAGNOSIS — T83012A Breakdown (mechanical) of nephrostomy catheter, initial encounter: Secondary | ICD-10-CM | POA: Diagnosis not present

## 2019-01-17 DIAGNOSIS — K5939 Other megacolon: Secondary | ICD-10-CM | POA: Diagnosis not present

## 2019-01-17 DIAGNOSIS — E11649 Type 2 diabetes mellitus with hypoglycemia without coma: Secondary | ICD-10-CM | POA: Diagnosis present

## 2019-01-17 DIAGNOSIS — K598 Other specified functional intestinal disorders: Secondary | ICD-10-CM | POA: Diagnosis not present

## 2019-01-17 DIAGNOSIS — I10 Essential (primary) hypertension: Secondary | ICD-10-CM | POA: Diagnosis present

## 2019-01-17 LAB — COMPREHENSIVE METABOLIC PANEL
ALT: 53 U/L — ABNORMAL HIGH (ref 0–44)
AST: 24 U/L (ref 15–41)
Albumin: 2.9 g/dL — ABNORMAL LOW (ref 3.5–5.0)
Alkaline Phosphatase: 305 U/L — ABNORMAL HIGH (ref 38–126)
Anion gap: 12 (ref 5–15)
BUN: 24 mg/dL — ABNORMAL HIGH (ref 8–23)
CO2: 28 mmol/L (ref 22–32)
Calcium: 9.4 mg/dL (ref 8.9–10.3)
Chloride: 101 mmol/L (ref 98–111)
Creatinine, Ser: 1.52 mg/dL — ABNORMAL HIGH (ref 0.44–1.00)
GFR calc Af Amer: 42 mL/min — ABNORMAL LOW (ref >60)
GFR calc non Af Amer: 36 mL/min — ABNORMAL LOW (ref >60)
Glucose, Bld: 164 mg/dL — ABNORMAL HIGH (ref 70–99)
Potassium: 4 mmol/L (ref 3.5–5.1)
Sodium: 141 mmol/L (ref 135–145)
Total Bilirubin: 0.6 mg/dL (ref 0.3–1.2)
Total Protein: 7.4 g/dL (ref 6.5–8.1)

## 2019-01-17 LAB — CBC WITH DIFFERENTIAL/PLATELET
Abs Immature Granulocytes: 0.38 10*3/uL — ABNORMAL HIGH (ref 0.00–0.07)
Basophils Absolute: 0.1 10*3/uL (ref 0.0–0.1)
Basophils Relative: 1 %
Eosinophils Absolute: 0 10*3/uL (ref 0.0–0.5)
Eosinophils Relative: 0 %
HCT: 37.6 % (ref 36.0–46.0)
Hemoglobin: 11.5 g/dL — ABNORMAL LOW (ref 12.0–15.0)
Immature Granulocytes: 3 %
Lymphocytes Relative: 4 %
Lymphs Abs: 0.6 10*3/uL — ABNORMAL LOW (ref 0.7–4.0)
MCH: 29.4 pg (ref 26.0–34.0)
MCHC: 30.6 g/dL (ref 30.0–36.0)
MCV: 96.2 fL (ref 80.0–100.0)
Monocytes Absolute: 1 10*3/uL (ref 0.1–1.0)
Monocytes Relative: 7 %
Neutro Abs: 11.7 10*3/uL — ABNORMAL HIGH (ref 1.7–7.7)
Neutrophils Relative %: 85 %
Platelets: 279 10*3/uL (ref 150–400)
RBC: 3.91 MIL/uL (ref 3.87–5.11)
RDW: 14.1 % (ref 11.5–15.5)
WBC: 13.7 10*3/uL — ABNORMAL HIGH (ref 4.0–10.5)
nRBC: 0 % (ref 0.0–0.2)

## 2019-01-17 LAB — LIPASE, BLOOD: Lipase: 27 U/L (ref 11–51)

## 2019-01-17 LAB — GLUCOSE, CAPILLARY
Glucose-Capillary: 124 mg/dL — ABNORMAL HIGH (ref 70–99)
Glucose-Capillary: 100 mg/dL — ABNORMAL HIGH (ref 70–99)
Glucose-Capillary: 101 mg/dL — ABNORMAL HIGH (ref 70–99)
Glucose-Capillary: 94 mg/dL (ref 70–99)
Glucose-Capillary: 89 mg/dL (ref 70–99)
Glucose-Capillary: 98 mg/dL (ref 70–99)

## 2019-01-17 LAB — LACTIC ACID, PLASMA: Lactic Acid, Venous: 1.8 mmol/L (ref 0.5–1.9)

## 2019-01-17 MED ORDER — HYDROMORPHONE HCL 1 MG/ML IJ SOLN
0.5000 mg | INTRAMUSCULAR | Status: DC | PRN
  Administered 2019-01-17 (×5): 0.5 mg via INTRAVENOUS
  Filled 2019-01-17 (×5): qty 1

## 2019-01-17 MED ORDER — DIATRIZOATE MEGLUMINE & SODIUM 66-10 % PO SOLN
90.0000 mL | Freq: Once | ORAL | Status: AC
  Administered 2019-01-17: 12:00:00 90 mL via NASOGASTRIC
  Filled 2019-01-17: qty 90

## 2019-01-17 MED ORDER — LEVOTHYROXINE SODIUM 100 MCG/5ML IV SOLN
75.0000 ug | Freq: Every day | INTRAVENOUS | Status: DC
  Administered 2019-01-17 – 2019-01-22 (×5): 75 ug via INTRAVENOUS
  Filled 2019-01-17 (×10): qty 5

## 2019-01-17 MED ORDER — SODIUM CHLORIDE 0.9 % IV SOLN
INTRAVENOUS | Status: AC
  Administered 2019-01-17 – 2019-01-18 (×3): via INTRAVENOUS

## 2019-01-17 MED ORDER — IOHEXOL 300 MG/ML  SOLN
80.0000 mL | Freq: Once | INTRAMUSCULAR | Status: AC | PRN
  Administered 2019-01-17: 04:00:00 80 mL via INTRAVENOUS

## 2019-01-17 MED ORDER — ACETAMINOPHEN 650 MG RE SUPP: 650.0000 mg | Freq: Four times a day (QID) | RECTAL | Status: DC | PRN

## 2019-01-17 MED ORDER — ONDANSETRON HCL 4 MG/2ML IJ SOLN
4.0000 mg | Freq: Four times a day (QID) | INTRAMUSCULAR | Status: DC | PRN
  Administered 2019-01-17 – 2019-01-19 (×9): 4 mg via INTRAVENOUS
  Filled 2019-01-17 (×10): qty 2

## 2019-01-17 MED ORDER — MORPHINE SULFATE (PF) 2 MG/ML IV SOLN: 1.0000 mg | INTRAVENOUS | Status: DC | PRN

## 2019-01-17 MED ORDER — MORPHINE SULFATE (PF) 2 MG/ML IV SOLN
2.0000 mg | Freq: Once | INTRAVENOUS | Status: AC
  Administered 2019-01-17: 2 mg via INTRAVENOUS
  Filled 2019-01-17: qty 1

## 2019-01-17 MED ORDER — METOCLOPRAMIDE HCL 5 MG/ML IJ SOLN
10.0000 mg | INTRAMUSCULAR | Status: AC
  Administered 2019-01-17: 10 mg via INTRAVENOUS
  Filled 2019-01-17: qty 2

## 2019-01-17 MED ORDER — FENTANYL 50 MCG/HR TD PT72
1.0000 | MEDICATED_PATCH | TRANSDERMAL | Status: DC
  Administered 2019-01-19 – 2019-01-22 (×2): 1 via TRANSDERMAL
  Filled 2019-01-17 (×2): qty 1

## 2019-01-17 MED ORDER — ONDANSETRON HCL 4 MG PO TABS: 4.0000 mg | ORAL_TABLET | Freq: Four times a day (QID) | ORAL | Status: DC | PRN

## 2019-01-17 MED ORDER — INSULIN ASPART 100 UNIT/ML ~~LOC~~ SOLN
0.0000 [IU] | Freq: Three times a day (TID) | SUBCUTANEOUS | Status: DC
  Administered 2019-01-17: 1 [IU] via SUBCUTANEOUS
  Administered 2019-01-20 – 2019-01-21 (×2): 2 [IU] via SUBCUTANEOUS

## 2019-01-17 MED ORDER — HYDROMORPHONE HCL 1 MG/ML IJ SOLN
1.0000 mg | Freq: Once | INTRAMUSCULAR | Status: AC
  Administered 2019-01-17: 1 mg via INTRAVENOUS
  Filled 2019-01-17: qty 1

## 2019-01-17 MED ORDER — HYDROMORPHONE HCL 1 MG/ML IJ SOLN
1.0000 mg | INTRAMUSCULAR | Status: DC | PRN
  Administered 2019-01-17 – 2019-01-20 (×18): 1 mg via INTRAVENOUS
  Filled 2019-01-17 (×18): qty 1

## 2019-01-17 MED ORDER — SODIUM CHLORIDE 0.9 % IV BOLUS
1000.0000 mL | Freq: Once | INTRAVENOUS | Status: AC
  Administered 2019-01-17: 04:00:00 1000 mL via INTRAVENOUS

## 2019-01-17 MED ORDER — ACETAMINOPHEN 325 MG PO TABS: 650.0000 mg | ORAL_TABLET | Freq: Four times a day (QID) | ORAL | Status: DC | PRN

## 2019-01-17 MED ORDER — LIDOCAINE VISCOUS HCL 2 % MT SOLN
3.0000 mL | Freq: Once | OROMUCOSAL | Status: AC
  Administered 2019-01-17: 3 mL via OROMUCOSAL

## 2019-01-17 MED ORDER — SODIUM CHLORIDE 0.9 % IV BOLUS
1000.0000 mL | Freq: Once | INTRAVENOUS | Status: AC
  Administered 2019-01-17: 07:00:00 1000 mL via INTRAVENOUS

## 2019-01-17 NOTE — ED Notes (Signed)
Admitting provider Dr. Hal Hope at bedside. Will take pt. Up to floor when MD assessment complete

## 2019-01-17 NOTE — ED Provider Notes (Signed)
[name] EM[facility]MENT Provider Note   CSN: [id number] Arrival date & time: 01/17/19  0303    History   Chief Complaint No chief complaint on file.   HPI [name] is a 63 y.o. female.    63 y/o female with complex PMH including DM, CKD, IBS, ovarian cancer and recurrent endometrial cancer s/p colostomy takedown with loop ileostomy diversion in August 2423 complicated by abdominal abscesses, rectovesical fistula s/p diverting bilateral percutaneous nephrostomies in 09/02/2017 presents to the ED for c/o abdominal pain.  Reports that abdominal pain began Monday evening.  It has been fairly constant and gradually worsening.  Pain is present in the left lower quadrant.  It is nonradiating; does wax and wane in severity.  She began to have increased nausea and vomiting Tuesday morning.  Patient reporting TNTC episodes of emesis, likely >15.  Emesis bilious in color.  She expresses concern that she has an obstruction.  Patient with very little stool output from her ileostomy in the past 24 hours.  She has continued to have persistent belching.  Denies taking any medications at home for symptoms, but did make herself n.p.o. in hopes that her pain and vomiting would resolve.  She finished her course of meropenem yesterday which she was taking for recurrent UTI.  No associated fevers, melena, hematochezia, hematemesis.   The history is provided by the patient. No language interpreter was used.    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+, HER2 negative/  12-11-2015 bilateral mastecotmy w/ reconstruction (no radiation and no chemo)   Cancer of corpus uteri, except isthmus [facility])  oncologist-- dr Denman [name] and dr  Alvy Bimler    10-15-2004  dx endometroid endometrial and ovarian cancer s/p  chemotheapy and surgery(TAH w/ BSO) :  recurrent 11-19-2014 post pelvic surgery and radiation 01-29-2015 to 03-10-2015   Chronic idiopathic neutropenia ([place])    presumed related to chemotherapy March 2006--- followed by dr Alvy Bimler (treatment w/ G-CSF injections   Chronic nausea    Chronic pain    perineal/ anal  area from bladder pad irritates skin , right flank pain   CKD stage G2/A3, GFR 60-89 and albumin creatinine ratio >300 mg/g    nephrologist-  dr Madelon Lips   Colovesical fistula    Diabetic retinopathy, background ([place])    Difficult intravenous access    small veins--- hx PICC lines   DM type 2 (diabetes mellitus, type 2) ([place])    monitored by dr Legrand Como altheimer   Dysuria    Environmental and seasonal allergies    Fatty liver 02/08/2016   CT   Generalized muscle weakness    GERD (gastroesophageal reflux disease)    Hiatal hernia    History of abdominal abscess 04/16/2017   post surgery 04-01-2017  --- resolved 10/ 2018   History of gastric polyp    2014  duodenum   History of ileus 04/16/2017   resolved w/ no surgical intervention   History of radiation therapy    01-29-2015 to 03-10-2015  pelvis 50.4Gy   Hypothyroidism    monitored by dr Legrand Como altheimer   IBS (irritable bowel syndrome)    Ileostomy in place [facility]) 04/01/2017  created at same time colostomy takedown.   Joint pain    Leg edema    Lower urinary tract symptoms (LUTS)    urge urinary  incontinence   Mixed dyslipidemia    Multiple thyroid nodules    Managed by Dr. Harlow Asa   Nephrostomy status [facility])    Palpitations    Pelvic abscess in female 04/16/2017   PONV (postoperative nausea and vomiting)    "scopolamine patch works for me"   Radiation-induced dermatitis    contact dermatitis , radiation completed, rash only on ankles now.   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   Vitamin D deficiency    Wears glasses     Patient Active Problem List   Diagnosis Date Noted   Depression 01/16/2019   Other chronic postprocedural pain 05/25/2018   Malignant neoplasm of uterus ([place]) 05/25/2018   Recurrent UTI 03/08/2018   Acute pyelonephritis 11/29/2017   HLD (hyperlipidemia) 11/29/2017   GERD (gastroesophageal reflux disease) 11/29/2017   Type II diabetes mellitus with renal manifestations ([place]) 11/29/2017   Encounter for palliative care    Goals of care, counseling/discussion    Poor venous access    Acute renal failure superimposed on chronic kidney disease ([place]) 10/02/2017   Decubitus ulcer of sacral region, stage 2 ([place]) 28/36/6294   Metabolic acidosis 76/54/6503   MDD (major depressive disorder), single episode, moderate ([place])    Poorly controlled diabetes mellitus ([place]) 08/31/2017   Pressure injury of skin 08/31/2017   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 08/31/2017   History of external beam radiation therapy to pelvis 08/31/2017   ARF (acute renal failure) ([place]) 08/30/2017   Adjustment disorder with mixed anxiety and depressed mood 08/30/2017   Dehydration 08/29/2017   CKD (chronic kidney disease), stage III ([place]) 08/24/2017   Wound of right buttock 08/10/2017   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[place]) 06/20/2017   Protein-calorie malnutrition, moderate ([place]) 04/18/2017   Postoperative anemia 04/04/2017   Pelvic cancer s/p colostomy takedown/loop ileostomy diversion 04/01/2017 04/01/2017   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 04/01/2017   Hot flashes related to aromatase inhibitor therapy 03/18/2017   Ureteral stricture, right, 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) 04/01/2017 09/10/2016   Vitamin D insufficiency 08/30/2016   Breast cancer of upper-inner quadrant of left female breast ([place])     Pelvic pain in female 03/19/2015   Chronic anemia 12/30/2014   UTI (urinary tract infection)    Pancytopenia, acquired ([place]) 11/26/2014   Class 1 obesity with serious comorbidity and body mass index (BMI) of 34.0 to 34.9 in adult 11/20/2014   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 s/p resection/partial vaginectomy/ LAR/colostomy 11/19/2014 11/19/2014   Abdominal pain 09/09/2014   Postmenopausal bleeding 09/05/2014   History of ovarian & endometrial cancer 07/17/2012   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[place]) 05/10/2012   Chronic neutropenia ([place]) 12/14/2011   Primary hypothyroidism 12/14/2011   DM type 2 (diabetes mellitus, type 2) ([place]) 12/14/2011   Benign essential HTN 12/14/2011    Past Surgical History:  Procedure Laterality Date   APPENDECTOMY     biopsy thyroid nodules     BREAST RECONSTRUCTION WITH PLACEMENT OF TISSUE EXPANDER AND FLEX HD (ACELLULAR HYDRATED DERMIS) Bilateral 12/11/2015   Procedure: BILATERAL BREAST RECONSTRUCTION WITH PLACEMENT OF TISSUE EXPANDERS;  Surgeon: Irene Limbo, MD;  Location: [place];  Service: Plastics;  Laterality: Bilateral;   COLONOSCOPY WITH  PROPOFOL N/A 08/21/2013   Procedure: COLONOSCOPY WITH PROPOFOL;  Surgeon: Cleotis Nipper, MD;  [facility];  Service: Endoscopy;  Laterality: N/A;   COLOSTOMY TAKEDOWN N/A 12/04/2014   Procedure: LAPROSCOPIC LYSIS OF ADHESIONS, SPLENIC MOBILIZATION, RELOCATION OF COLOSTOMY, DEBRIDEMENT INITIAL COLOSTOMY SITE;  Surgeon: Michael [name], MD;  [facility];  Service: General;  Laterality: N/A;   CYSTOGRAM N/A 06/01/2017   Procedure: CYSTOGRAM;  Surgeon: Alexis Frock, MD;  [facility];  Service: Urology;  Laterality: N/A;   CYSTOSCOPY W/ RETROGRADES Right 11/21/2015   Procedure: CYSTOSCOPY WITH RETROGRADE PYELOGRAM;  Surgeon: Alexis Frock, MD;  [facility];  Service: Urology;  Laterality: Right;   CYSTOSCOPY W/ URETERAL STENT PLACEMENT Right 11/21/2015    Procedure: CYSTOSCOPY WITH STENT REPLACEMENT;  Surgeon: Alexis Frock, MD;  [facility];  Service: Urology;  Laterality: Right;   CYSTOSCOPY W/ URETERAL STENT PLACEMENT Right 03/10/2016   Procedure: CYSTOSCOPY WITH STENT REPLACEMENT;  Surgeon: Alexis Frock, MD;  Location: [facility];  Service: Urology;  Laterality: Right;   CYSTOSCOPY W/ URETERAL STENT PLACEMENT Right 06/30/2016   Procedure: CYSTOSCOPY WITH RETROGRADE PYELOGRAM/URETERAL STENT EXCHANGE;  Surgeon: Alexis Frock, MD;  Location: [facility];  Service: Urology;  Laterality: Right;   CYSTOSCOPY W/ URETERAL STENT PLACEMENT N/A 06/01/2017   Procedure: CYSTOSCOPY WITH EXAM UNDER ANESTHESIA;  Surgeon: Alexis Frock, MD;  [facility];  Service: Urology;  Laterality: N/A;   CYSTOSCOPY W/ URETERAL STENT PLACEMENT Right 08/17/2017   Procedure: CYSTOSCOPY WITH RETROGRADE PYELOGRAM/URETERAL STENT REMOVAL;  Surgeon: Alexis Frock, MD;  [facility];  Service: Urology;  Laterality: Right;   CYSTOSCOPY WITH RETROGRADE PYELOGRAM, URETEROSCOPY AND STENT PLACEMENT Right 03/20/2015   Procedure: CYSTOSCOPY WITH RETROGRADE PYELOGRAM, URETEROSCOPY WITH BALLOON DILATION AND STENT PLACEMENT ON RIGHT;  Surgeon: Alexis Frock, MD;  Location: [facility];  Service: Urology;  Laterality: Right;   CYSTOSCOPY WITH RETROGRADE PYELOGRAM, URETEROSCOPY AND STENT PLACEMENT Right 05/02/2015   Procedure: CYSTOSCOPY WITH RIGHT RETROGRADE PYELOGRAM,  DIAGNOSTIC URETEROSCOPY AND STENT PULL ;  Surgeon: Alexis Frock, MD;  Location: [facility];  Service: Urology;  Laterality: Right;   CYSTOSCOPY WITH RETROGRADE PYELOGRAM, URETEROSCOPY AND STENT PLACEMENT Right 09/05/2015   Procedure: CYSTOSCOPY WITH RETROGRADE PYELOGRAM,  AND STENT PLACEMENT;  Surgeon: Alexis Frock, MD;  [facility];  Service: Urology;  Laterality: Right;   CYSTOSCOPY WITH RETROGRADE PYELOGRAM,  URETEROSCOPY AND STENT PLACEMENT Right 04/01/2017   Procedure: CYSTOSCOPY WITH RETROGRADE PYELOGRAM, URETEROSCOPY AND STENT PLACEMENT;  Surgeon: Alexis Frock, MD;  [facility];  Service: Urology;  Laterality: Right;   CYSTOSCOPY WITH STENT PLACEMENT Right 10/27/2016   Procedure: CYSTOSCOPY WITH STENT CHANGE and right retrograde pyelogram;  Surgeon: Alexis Frock, MD;  Location: [facility];  Service: Urology;  Laterality: Right;   EUS N/A 10/02/2014   Procedure: LOWER ENDOSCOPIC ULTRASOUND (EUS);  Surgeon: Arta Silence, MD;  Location[facility];  Service: Endoscopy;  Laterality: N/A;   EXCISION SOFT TISSUE MASS RIGHT FOREMAN  12-08-2006   EYE SURGERY  as child   pytosis of eyelids repair   INCISION AND DRAINAGE OF WOUND Bilateral 12/26/2015   Procedure: DEBRIDEMENT OF BILATERAL MASTECTOMY FLAPS;  Surgeon: Irene Limbo, MD;  Location[place];  Service: Plastics;  Laterality: Bilateral;   IR CV LINE INJECTION  05/31/2017   IR FLUORO GUIDE CV LINE LEFT  05/31/2017   IR FLUORO GUIDE CV LINE RIGHT  04/06/2017   IR FLUORO  GUIDE CV MIDLINE PICC RIGHT  05/30/2017   IR NEPHROSTOGRAM LEFT INITIAL PLACEMENT  09/02/2017   IR NEPHROSTOGRAM LEFT THRU EXISTING ACCESS  11/29/2017   IR NEPHROSTOGRAM RIGHT INITIAL PLACEMENT  09/02/2017   IR NEPHROSTOGRAM RIGHT THRU EXISTING ACCESS  09/13/2017   IR NEPHROSTOGRAM RIGHT THRU EXISTING ACCESS  11/29/2017   IR NEPHROSTOMY EXCHANGE LEFT  11/28/2017   IR NEPHROSTOMY EXCHANGE LEFT  01/05/2018   IR NEPHROSTOMY EXCHANGE LEFT  02/16/2018   IR NEPHROSTOMY EXCHANGE LEFT  03/30/2018   IR NEPHROSTOMY EXCHANGE LEFT  05/12/2018   IR NEPHROSTOMY EXCHANGE LEFT  06/21/2018   IR NEPHROSTOMY EXCHANGE LEFT  08/04/2018   IR NEPHROSTOMY EXCHANGE LEFT  09/18/2018   IR NEPHROSTOMY EXCHANGE LEFT  10/09/2018   IR NEPHROSTOMY EXCHANGE LEFT  10/27/2018   IR NEPHROSTOMY EXCHANGE LEFT  11/21/2018   IR NEPHROSTOMY EXCHANGE LEFT   01/05/2019   IR NEPHROSTOMY EXCHANGE RIGHT  10/02/2017   IR NEPHROSTOMY EXCHANGE RIGHT  11/28/2017   IR NEPHROSTOMY EXCHANGE RIGHT  01/05/2018   IR NEPHROSTOMY EXCHANGE RIGHT  02/16/2018   IR NEPHROSTOMY EXCHANGE RIGHT  03/30/2018   IR NEPHROSTOMY EXCHANGE RIGHT  05/12/2018   IR NEPHROSTOMY EXCHANGE RIGHT  06/21/2018   IR NEPHROSTOMY EXCHANGE RIGHT  08/04/2018   IR NEPHROSTOMY EXCHANGE RIGHT  09/18/2018   IR NEPHROSTOMY EXCHANGE RIGHT  10/27/2018   IR NEPHROSTOMY EXCHANGE RIGHT  11/21/2018   IR NEPHROSTOMY EXCHANGE RIGHT  01/05/2019   IR NEPHROSTOMY PLACEMENT LEFT  10/02/2017   IR RADIOLOGIST EVAL & MGMT  05/03/2017   IR US GUIDE VASC ACCESS LEFT  05/31/2017   IR US GUIDE VASC ACCESS RIGHT  04/06/2017   IR US GUIDE VASC ACCESS RIGHT  05/30/2017   LAPAROSCOPIC CHOLECYSTECTOMY  1990   LIPOSUCTION WITH LIPOFILLING Bilateral 04/16/2016   Procedure: LIPOSUCTION WITH LIPOFILLING TO BILATERAL CHEST;  Surgeon: Irene Limbo, MD;  Location: [place];  Service: Plastics;  Laterality: Bilateral;   MASTECTOMY W/ SENTINEL NODE BIOPSY Bilateral 12/11/2015   Procedure: RIGHT PROPHYLACTIC MASTECTOMY, LEFT TOTAL MASTECTOMY WITH LEFT AXILLARY SENTINEL LYMPH NODE BIOPSY;  Surgeon: Stark Klein, MD;  Location: [place];  Service: General;  Laterality: Bilateral;   OSTOMY N/A 11/19/2014   Procedure: OSTOMY;  Surgeon: Michael [name], MD;  [facility];  Service: General;  Laterality: N/A;   PROCTOSCOPY N/A 04/01/2017   Procedure: RIDGE PROCTOSCOPY;  Surgeon: Michael [name], MD;  [facility];  Service: General;  Laterality: N/A;   REMOVAL OF BILATERAL TISSUE EXPANDERS WITH PLACEMENT OF BILATERAL BREAST IMPLANTS Bilateral 04/16/2016   Procedure: REMOVAL OF BILATERAL TISSUE EXPANDERS WITH PLACEMENT OF BILATERAL BREAST IMPLANTS;  Surgeon: Irene Limbo, MD;  Location: [place];  Service: Plastics;  Laterality: Bilateral;   ROBOTIC ASSISTED LAP VAGINAL HYSTERECTOMY N/A  11/19/2014   Procedure: ROBOTIC LYSIS OF ADHESIONS, CONVERTED TO LAPAROTOMY RADICAL UPPER VAGINECTOMY,LOW ANTERIOR BOWEL RESECTION, COLOSTOMY, BILATERAL URETERAL STENT PLACEMENT AND CYSTONOMY CLOSURE;  Surgeon: Everitt Amber, MD;  [facility];  Service: Gynecology;  Laterality: N/A;   TISSUE EXPANDER FILLING Bilateral 12/26/2015   Procedure: EXPANSION OF BILATERAL CHEST TISSUE EXPANDERS (60 mL- Right; 75 mL- Left);  Surgeon: Irene Limbo, MD;  Location: [place];  Service: Plastics;  Laterality: Bilateral;   TONSILLECTOMY     TOTAL ABDOMINAL HYSTERECTOMY  March 2006   [facility]   and Bilateral Salpingoophorectomy/  staging for Ovarian cancer/  an   XI ROBOTIC ASSISTED LOWER ANTERIOR RESECTION N/A 04/01/2017   Procedure: XI ROBOTIC  VS LAPAROSCOPIC COLOSTOMY TAKEDOWN WITH LYSIS OF ADHESIONS.;  Surgeon: Michael [name], MD;  [facility];  Service: General;  Laterality: N/A;  ERAS PATHWAY     OB History    Gravida  1   Para      Term      Preterm      AB      Living        SAB      TAB      Ectopic      Multiple      Live Births               Home Medications    Prior to Admission medications   Medication Sig Start Date End Date Taking? Authorizing Provider  acetaminophen (TYLENOL) 325 MG tablet Take 2 tablets (650 mg total) by mouth every 6 (six) hours as needed. 09/24/17   Roxan Hockey, MD  anastrozole (ARIMIDEX) 1 MG tablet TAKE 1 TABLET DAILY Patient taking differently: Take 1 mg by mouth daily.  01/30/18   Heath Lark, MD  Biotin 5 MG TABS Take 5 mg by mouth every morning.     [provider]  buPROPion (WELLBUTRIN SR) 200 MG 12 hr tablet Take 1 tablet (200 mg total) by mouth daily. 01/09/19   Dennard Nip D, MD  Cholecalciferol (VITAMIN D3) 10000 UNITS capsule Take 10,000 Units by mouth once a week. Sunday evening's    [provider]  clobetasol (OLUX) 0.05 % topical foam Apply topically 2 (two) times daily.     [provider]  diphenhydrAMINE (BENADRYL) 25 mg capsule Take 1 capsule (25 mg total) by mouth every 8 (eight) hours as needed for itching, allergies or sleep. 09/24/17   Roxan Hockey, MD  diphenoxylate-atropine (LOMOTIL) 2.5-0.025 MG tablet Take 1 tablet by mouth 4 (four) times daily as needed for diarrhea or loose stools. TO PREVENT LOOSE BOWEL MOVEMENTS 09/24/17   Roxan Hockey, MD  fentaNYL (DURAGESIC) 50 MCG/HR Place 1 patch onto the skin every 3 (three) days. 01/08/19   [name] R, DO  ferrous sulfate 325 (65 FE) MG tablet Take 1 tablet (325 mg total) by mouth at bedtime. 09/24/17   Roxan Hockey, MD  filgrastim-sndz (ZARXIO) 480 MCG/0.8ML SOSY injection Inject 0.8 mLs (480 mcg total) into the skin as directed. Inject 480 mcg every 5 days 12/04/18 12/04/19  Heath Lark, MD  HYDROcodone-acetaminophen (NORCO/VICODIN) 5-325 MG tablet Take 2 tablets by mouth every 6 (six) hours as needed for moderate pain. 01/08/19   Lowne Chase, Yvonne R, DO  JANUVIA 50 MG tablet Take 50 mg by mouth daily.  10/28/17   [provider]  LACTATED RINGERS IV Inject into the vein 3 (three) times a week.    [provider]  levothyroxine (SYNTHROID, LEVOTHROID) 150 MCG tablet Take 1 tablet (150 mcg total) by mouth daily before breakfast. 09/25/17   Denton Brick, Cour[place], MD  loratadine (CLARITIN) 10 MG tablet Take 10 mg by mouth every morning.     [provider]  nitrofurantoin, macrocrystal-monohydrate, (MACROBID) 100 MG capsule Take 100 mg by mouth at bedtime.    [provider]  omega-3 acid ethyl esters (LOVAZA) 1 G capsule Take 1 g by mouth 2 (two) times daily.     [provider]  oxybutynin (DITROPAN) 5 MG tablet  07/11/18   [provider]  Polyethyl Glycol-Propyl Glycol (SYSTANE OP) Place 1 drop into both eyes daily as needed (dry eyes).  [provider]  pregabalin (LYRICA) 50 MG capsule TAKE 1 CAPSULE(50 MG) BY MOUTH TWICE DAILY  10/02/18   Ann Held, DO  Prenatal Vit-Fe Fumarate-FA (PRENATAL VITAMIN PO) Take 1 capsule by mouth daily. Takes prenatal because there are no dyes in it    [provider]  rosuvastatin (CRESTOR) 10 MG tablet Take 10 mg by mouth every evening.     [provider]  Saccharomyces boulardii (FLORASTOR PO) Take 1 capsule by mouth daily. 02/16/18   [provider]    Family History Family History  Problem Relation Age of Onset   Cancer Mother 9       stomach ca   Hypertension Mother    Cancer Father 18       prostate ca   Diabetes Father    Heart disease Father        CABG   Hypertension Father    Hyperlipidemia Father    Obesity Father    Breast cancer Maternal Aunt        dx in her 46s   Lymphoma Paternal 22    Brain cancer Paternal Grandfather    Ovarian cancer Other    Diabetes Sister    Hypertension Brother y-10   Heart disease Brother        CABG   Diabetes Brother     Social History Social History   Tobacco Use   Smoking status: Never Smoker   Smokeless tobacco: Never Used  Substance Use Topics   Alcohol use: Not Currently   Drug use: No     Allergies   Penicillins, Cefaclor, Erythromycin, Tape, Trimethoprim, Ultram [tramadol], Cephalosporins, Fluconazole, Oxycodone, Pectin, Septra [sulfamethoxazole-trimethoprim], and Sulfa antibiotics   Review of Systems Review of Systems Ten systems reviewed and are negative for acute change, except as noted in the HPI.    Physical Exam Updated Vital Signs BP (!) 139/56    Pulse 72    Temp 97.6 F (36.4 C) (Oral)    Resp 16    Ht _0  (1.575 m)    Wt 87.1 kg    SpO2 99%    BMI 35.12 kg/m   Physical Exam Vitals signs and nursing note reviewed.  Constitutional:      General: She is not in acute distress.    Appearance: She is well-developed. She is not diaphoretic.     Comments: Patient intermittently moaning in pain.  HENT:     Head: Normocephalic and  atraumatic.  Eyes:     General: No scleral icterus.    Conjunctiva/sclera: Conjunctivae normal.  Neck:     Musculoskeletal: Normal range of motion.  Cardiovascular:     Rate and Rhythm: Normal rate and regular rhythm.     Pulses: Normal pulses.  Pulmonary:     Effort: Pulmonary effort is normal. No respiratory distress.     Comments: Respirations even and unlabored Abdominal:     Palpations: Abdomen is soft. There is no mass.     Tenderness: There is abdominal tenderness (generalized, worse in bilateral upper quadrants). There is no guarding.     Hernia: No hernia is present.       Comments: Abdominal exam limited 2/2 habitus and pain.  Musculoskeletal: Normal range of motion.  Skin:    General: Skin is warm and dry.     Coloration: Skin is not pale.     Findings: No erythema or rash.  Neurological:     Mental Status: She is alert and  oriented to person, place, and time.     Comments: GCS 15. Patient moving all extremities spontaneously.  Psychiatric:        Behavior: Behavior normal.      ED Treatments / Results  Labs (all labs ordered are listed, but only abnormal results are displayed) Labs Reviewed  CBC WITH DIFFERENTIAL/PLATELET - Abnormal; Notable for the following components:      Result Value   WBC 13.7 (*)    Hemoglobin 11.5 (*)    Neutro Abs 11.7 (*)    Lymphs Abs 0.6 (*)    Abs Immature Granulocytes 0.38 (*)    All other components within normal limits  COMPREHENSIVE METABOLIC PANEL - Abnormal; Notable for the following components:   Glucose, Bld 164 (*)    BUN 24 (*)    Creatinine, Ser 1.52 (*)    Albumin 2.9 (*)    ALT 53 (*)    Alkaline Phosphatase 305 (*)    GFR calc non Af Amer 36 (*)    GFR calc Af Amer 42 (*)    All other components within normal limits  NOVEL CORONAVIRUS, NAA (HOSPITAL ORDER, SEND-OUT TO REF LAB)  LIPASE, BLOOD  LACTIC ACID, PLASMA    EKG None  Radiology Ct Abdomen Pelvis W Contrast  Result Date: 01/17/2019 CLINICAL  DATA:  Abdominal pain, unspecified. EXAM: CT ABDOMEN AND PELVIS WITH CONTRAST TECHNIQUE: Multidetector CT imaging of the abdomen and pelvis was performed using the standard protocol following bolus administration of intravenous contrast. CONTRAST:  22m OMNIPAQUE IOHEXOL 300 MG/ML  SOLN COMPARISON:  CT abdomen and pelvis 01/02/2019 FINDINGS: Lower chest: Lung bases are clear without focal nodule, mass, or airspace disease. The heart size is normal. No significant pleural or pericardial effusion is present. Hepatobiliary: Mild diffuse fatty infiltration of the liver is present. No discrete lesions are evident. There is mild intra and extrahepatic biliary dilation following cholecystectomy. The biliary ducts are slightly more prominent than on the prior study. No obstructing lesion is evident. Pancreas: Unremarkable. No pancreatic ductal dilatation or surrounding inflammatory changes. Spleen: Normal in size without focal abnormality. Adrenals/Urinary Tract: Adrenal glands are normal bilaterally. Bilateral percutaneous nephrostomy catheters are in place. The collecting systems are decompressed. There is no stone or mass lesion in either kidney. Ureters are within normal limits. A Foley catheter is present in the urinary bladder. Stomach/Bowel: Stomach is within normal limits. Duodenum is unremarkable. Dilated loops of small bowel extend into the left upper quadrant. There are multiple small ventral hernias. The transition point appears to be just below the umbilicus. The obstruction is most likely due to adhesions. Although there is a small ventral hernia, no definite strangulated bowel is evident. A loops of more distal decompressed bowel extends into a more superior left ventral hernia. This is chronic. The ostomy is decompressed. Residual colon is unremarkable. Vascular/Lymphatic: Presacral soft tissue is stable. Retroperitoneal dissection is noted. No new adenopathy is present. Reproductive: Postsurgical changes  are noted. Other: No free fluid is present.  There is no free air. Musculoskeletal: Vertebral body heights and alignment are maintained. Slight degenerative anterolisthesis at L3-4 is again noted. There straightening of the normal lumbar lordosis. Bony pelvis is within normal limits. The hips are located and normal. IMPRESSION: 1. Small bowel obstruction. Transition is near the umbilicus, likely related to adhesions. No definite strangulated bowel is present. 2. Decompressed small bowel extends into a separate ventral hernia more superiorly and to the left. 3. Ostomy is decompressed. 4. Bilateral percutaneous nephrostomy drains  in place. Collecting systems are decompressed. 5. Foley catheter in place. 6. Stable postsurgical changes of the pelvis. These results were called by telephone at the time of interpretation on 01/17/2019 at 4:30 am to [facility] , who verbally acknowledged these results. Electronically Signed   By: San Morelle M.D.   On: 01/17/2019 04:33    Procedures Procedures (including critical care time)  Medications Ordered in ED Medications  HYDROmorphone (DILAUDID) injection 1 mg (1 mg Intravenous Given 01/17/19 0345)  metoCLOPramide (REGLAN) injection 10 mg (10 mg Intravenous Given 01/17/19 0344)  sodium chloride 0.9 % bolus 1,000 mL (1,000 mLs Intravenous New Bag/Given 01/17/19 0344)  iohexol (OMNIPAQUE) 300 MG/ML solution 80 mL (80 mLs Intravenous Contrast Given 01/17/19 0356)  HYDROmorphone (DILAUDID) injection 1 mg (1 mg Intravenous Given 01/17/19 0440)    4:38 AM Consult placed to [facility] to discuss SBO. Orders placed for NG tube placement.  4:49 AM Case discussed with Dr. Kae Heller.  [facility] to consult on the patient case in the morning.  They advise admission to the medicine service.  [facility] paged to admit.  5:01 AM Dr. Hal Hope of [facility] to admit   Initial Impression / Assessment and Plan / ED Course  I have reviewed the triage vital signs and the  nursing notes.  Pertinent labs & imaging results that were available during my care of the patient were reviewed by me and considered in my medical decision making (see chart for details).        63 year old female with a significantly complex past medical history presents for abdominal pain which began Monday evening.  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  Nausea and vomiting persistent over the past 24 hours.  CT scan obtained which shows small bowel obstruction.  This is favored to be secondary to adhesions, though imaging does reveal 2 ventral hernias separate from the patient's existing ostomy.  She has been ordered to have an NG tube placed.  General surgery to consult on patient case in the morning.  [facility] will admit.   Final Clinical Impressions(s) / ED Diagnoses   Final diagnoses:  Small bowel obstruction [facility])    ED Discharge Orders    None       Antonietta Breach, PA-C 01/17/19 0502    Ward, Delice Bison, DO 01/17/19 [phone]

## 2019-01-17 NOTE — Progress Notes (Signed)
-  Patient was admitted earlier today. -Patient has complicated oncology and surgical history. -Patient has had a total abdominal hysterectomy.  Patient has had chemotherapy for the endometrial/ovarian cancer. 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  Patient is awaiting further surgical treatment at [facility]. -Patient was admitted with small bowel obstruction. -Surgical team is directing care.  Further management will depend on hospital course.

## 2019-01-17 NOTE — Telephone Encounter (Signed)
Pt admitted

## 2019-01-17 NOTE — Progress Notes (Signed)
MP:[id number] pain, nausea and vomiting  Subjective: Feels better with NG in clamped now for SBP.   Objective: Vital signs in last 24 hours: Temp:  [97.6 F (36.4 C)-97.8 F (36.6 C)] 97.7 F (36.5 C) (06/17 4008) Pulse Rate:  [67-73] 73 (06/17 0608) Resp:  [11-16] 16 (06/17 0608) BP: (139-175)/(51-72) 143/58 (06/17 0608) SpO2:  [93 %-100 %] 98 % (06/17 0608) Weight:  [87.1 kg-90.7 kg] 90.7 kg (06/17 0608)  1000 IV Nothing else recorded Afebrile, VSS Creatinine 1.52, glucose 164  Intake/Output from previous day: 06/16 0701 - 06/17 0700 In: 1000 [IV Piggyback:1000] Out: -  Intake/Output this shift: Total I/O In: -  Out: 225 [Urine:225]  General appearance: alert, cooperative and no distress Resp: clear to auscultation bilaterally GI: soft, more comfortable, BS hypoactive, no BM x 48 hours some liquid in the bag currently.    Lab Results:  Recent Labs    01/17/19 0351  WBC 13.7*  HGB 11.5*  HCT 37.6  PLT 279    BMET Recent Labs    01/17/19 0351  NA 141  K 4.0  CL 101  CO2 28  GLUCOSE 164*  BUN 24*  CREATININE 1.52*  CALCIUM 9.4   PT/INR No results for input(s): LABPROT, INR in the last 72 hours.  Recent Labs  Lab 01/17/19 0351  AST 24  ALT 53*  ALKPHOS 305*  BILITOT 0.6  PROT 7.4  ALBUMIN 2.9*     Lipase     Component Value Date/Time   LIPASE 27 01/17/2019 0351     Prior to Admission medications   Medication Sig Start Date End Date Taking? Authorizing Provider  acetaminophen (TYLENOL) 325 MG tablet Take 2 tablets (650 mg total) by mouth every 6 (six) hours as needed. 09/24/17  Yes Emokpae, Courage, MD  anastrozole (ARIMIDEX) 1 MG tablet TAKE 1 TABLET DAILY Patient taking differently: Take 1 mg by mouth daily.  01/30/18  Yes Gorsuch, Ni, MD  Biotin 5 MG TABS Take 5 mg by mouth daily.    Yes [provider]  buPROPion (WELLBUTRIN SR) 200 MG 12 hr tablet Take 1 tablet (200 mg total) by mouth daily. 01/09/19  Yes Beasley, Caren  D, MD  Cholecalciferol (VITAMIN D3) 10000 UNITS capsule Take 10,000 Units by mouth every Sunday.    Yes [provider]  clobetasol (OLUX) 0.05 % topical foam Apply topically 2 (two) times daily.   Yes [provider]  diphenhydrAMINE (BENADRYL) 25 mg capsule Take 1 capsule (25 mg total) by mouth every 8 (eight) hours as needed for itching, allergies or sleep. 09/24/17  Yes Emokpae, Courage, MD  diphenoxylate-atropine (LOMOTIL) 2.5-0.025 MG tablet Take 1 tablet by mouth 4 (four) times daily as needed for diarrhea or loose stools. TO PREVENT LOOSE BOWEL MOVEMENTS 09/24/17  Yes Emokpae, Courage, MD  fentaNYL (DURAGESIC) 50 MCG/HR Place 1 patch onto the skin every 3 (three) days. 01/08/19  Yes [name] R, DO  ferrous sulfate 325 (65 FE) MG tablet Take 1 tablet (325 mg total) by mouth at bedtime. 09/24/17  Yes Emokpae, Courage, MD  filgrastim-sndz (ZARXIO) 480 MCG/0.8ML SOSY injection Inject 0.8 mLs (480 mcg total) into the skin as directed. Inject 480 mcg every 5 days 12/04/18 12/04/19 Yes Gorsuch, Ni, MD  HYDROcodone-acetaminophen (NORCO/VICODIN) 5-325 MG tablet Take 2 tablets by mouth every 6 (six) hours as needed for moderate pain. 01/08/19  Yes Lowne Chase, Yvonne R, DO  JANUVIA 50 MG tablet Take 50 mg by mouth daily.  10/28/17  Yes [provider]  levothyroxine (SYNTHROID, LEVOTHROID) 150 MCG tablet Take 1 tablet (150 mcg total) by mouth daily before breakfast. 09/25/17  Yes Emokpae, Courage, MD  loratadine (CLARITIN) 10 MG tablet Take 10 mg by mouth daily.    Yes [provider]  nitrofurantoin, macrocrystal-monohydrate, (MACROBID) 100 MG capsule Take 100 mg by mouth at bedtime.   Yes [provider]  omega-3 acid ethyl esters (LOVAZA) 1 G capsule Take 1 g by mouth 2 (two) times daily.    Yes [provider]  Polyethyl Glycol-Propyl Glycol (SYSTANE OP) Place 1 drop into both eyes daily as needed (dry eyes).    Yes [provider]   pregabalin (LYRICA) 50 MG capsule TAKE 1 CAPSULE(50 MG) BY MOUTH TWICE DAILY Patient taking differently: Take 50 mg by mouth 2 (two) times daily.  10/02/18  Yes Ann Held, DO  Prenatal Vit-Fe Fumarate-FA (PRENATAL VITAMIN PO) Take 1 capsule by mouth daily. Takes prenatal because there are no dyes in it   Yes [provider]  rosuvastatin (CRESTOR) 10 MG tablet Take 10 mg by mouth every evening.    Yes [provider]  Saccharomyces boulardii (FLORASTOR PO) Take 1 capsule by mouth daily. 02/16/18  Yes [provider]   . sodium chloride 100 mL/hr at 01/17/19 1034    Medications: . [START ON 01/19/2019] fentaNYL  1 patch Transdermal Q72H  . insulin aspart  0-9 Units Subcutaneous TID WC  . levothyroxine  75 mcg Intravenous QAC breakfast    Assessment/Plan Hypertension Type II diabetes Hx Breast cancer Hx endometrail ovarian cancer Ureteral stricture hx/ureteral stent _ Dr. Clint Lipps Chronic pain - fentanyl/Norco/Lyrica CKD   SBO  - hx recurrent endometrial/ ovarian cancer (ex lap, posterior supralevator pelvic exenteration, end colostomy, bladder repair, ureteral stentingwith complete resection and negative margins on 11/09/14. Xi robotic LAR/Lysis of adhesions 04/01/17, SG; S/p adjuvant radiation completed 03/10/15; chemotherapy   FEN:  NPO/IV fluids DVT:  SCD's - can be on Lovenox from our standpoint for DVT prophylaxis  ID:  None Follow up:  TBD   Plan:  NG decompression, IV fluids, SBP, bowel rest    LOS: 0 days    [name] 01/17/2019 [phone]

## 2019-01-17 NOTE — H&P (Signed)
History and Physical    [name] IDP:[id number] DOB: 03-21-1956 DOA: 01/17/2019  PCP: Ann Held, DO  Patient coming from: Home.  Chief Complaint: Abdominal pain nausea vomiting.  HPI: [name] is a 63 y.o. female with history of endometrial and ovarian cancer status post surgery complicated with infection and presently patient has bilateral nephrostomy ileostomy and Foley catheter was recently treated for UTI with IV antibiotics until yesterday presents to the ER with complaint of persistent abdominal pain diffuse in nature with multiple episodes of nausea vomiting.  Pain has been going on for last 2 days.  Denies chest pain or shortness of breath fever chills or productive cough.  ED Course: In the ER patient had a CT abdomen pelvis which shows small bowel obstruction with transition point.  On-call general surgeon was consulted patient has NG tube placed and admitted for further management of small bowel obstruction.  Labs revealed elevated creatinine at baseline of 1.5 mild anemia.  Review of Systems: As per HPI, rest all negative.   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+, HER2 negative/  12-11-2015 bilateral mastecotmy w/ reconstruction (no radiation and no chemo)   Cancer of corpus uteri, except isthmus [facility])  oncologist-- dr Denman [name] and dr Alvy Bimler    10-15-2004  dx endometroid endometrial and ovarian cancer s/p  chemotheapy and surgery(TAH w/ BSO) :  recurrent 11-19-2014 post pelvic surgery and radiation 01-29-2015 to 03-10-2015   Chronic idiopathic neutropenia ([place])    presumed related to chemotherapy March 2006--- followed by dr Alvy Bimler (treatment w/ G-CSF injections   Chronic nausea    Chronic  pain    perineal/ anal  area from bladder pad irritates skin , right flank pain   CKD stage G2/A3, GFR 60-89 and albumin creatinine ratio >300 mg/g    nephrologist-  dr Madelon Lips   Colovesical fistula    Diabetic retinopathy, background ([place])    Difficult intravenous access    small veins--- hx PICC lines   DM type 2 (diabetes mellitus, type 2) ([place])    monitored by dr Legrand Como altheimer   Dysuria    Environmental and seasonal allergies    Fatty liver 02/08/2016   CT   Generalized muscle weakness    GERD (gastroesophageal reflux disease)    Hiatal hernia    History of abdominal abscess 04/16/2017   post surgery 04-01-2017  --- resolved 10/ 2018   History of gastric polyp    2014  duodenum   History of ileus 04/16/2017   resolved w/ no surgical intervention   History of radiation therapy    01-29-2015 to 03-10-2015  pelvis 50.4Gy   Hypothyroidism    monitored by dr Legrand Como altheimer   IBS (irritable bowel syndrome)    Ileostomy in place ([place]) 04/01/2017   created at same time colostomy takedown.   Joint pain    Leg edema    Lower urinary tract symptoms (LUTS)    urge urinary  incontinence   Mixed dyslipidemia    Multiple thyroid nodules    Managed by Dr. Harlow Asa   Nephrostomy status [facility])    Palpitations    Pelvic abscess in female 04/16/2017  PONV (postoperative nausea and vomiting)    "scopolamine patch works for me"   Radiation-induced dermatitis    contact dermatitis , radiation completed, rash only on ankles now.   Seasonal allergies    Ureteral stricture, right UROLOGIT-  DR [facility]   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   Vitamin D deficiency    Wears glasses     Past Surgical History:  Procedure Laterality Date   APPENDECTOMY     biopsy thyroid nodules     BREAST RECONSTRUCTION WITH PLACEMENT OF TISSUE EXPANDER AND FLEX HD (ACELLULAR HYDRATED DERMIS) Bilateral 12/11/2015    Procedure: BILATERAL BREAST RECONSTRUCTION WITH PLACEMENT OF TISSUE EXPANDERS;  Surgeon: Irene Limbo, MD;  Location: [place];  Service: Plastics;  Laterality: Bilateral;   COLONOSCOPY WITH PROPOFOL N/A 08/21/2013   Procedure: COLONOSCOPY WITH PROPOFOL;  Surgeon: Cleotis Nipper, MD;  [facility];  Service: Endoscopy;  Laterality: N/A;   COLOSTOMY TAKEDOWN N/A 12/04/2014   Procedure: LAPROSCOPIC LYSIS OF ADHESIONS, SPLENIC MOBILIZATION, RELOCATION OF COLOSTOMY, DEBRIDEMENT INITIAL COLOSTOMY SITE;  Surgeon: Michael [name], MD;  [facility];  Service: General;  Laterality: N/A;   CYSTOGRAM N/A 06/01/2017   Procedure: CYSTOGRAM;  Surgeon: Alexis Frock, MD;  [facility];  Service: Urology;  Laterality: N/A;   CYSTOSCOPY W/ RETROGRADES Right 11/21/2015   Procedure: CYSTOSCOPY WITH RETROGRADE PYELOGRAM;  Surgeon: Alexis Frock, MD;  [facility];  Service: Urology;  Laterality: Right;   CYSTOSCOPY W/ URETERAL STENT PLACEMENT Right 11/21/2015   Procedure: CYSTOSCOPY WITH STENT REPLACEMENT;  Surgeon: Alexis Frock, MD;  [facility];  Service: Urology;  Laterality: Right;   CYSTOSCOPY W/ URETERAL STENT PLACEMENT Right 03/10/2016   Procedure: CYSTOSCOPY WITH STENT REPLACEMENT;  Surgeon: Alexis Frock, MD;  Location: [facility];  Service: Urology;  Laterality: Right;   CYSTOSCOPY W/ URETERAL STENT PLACEMENT Right 06/30/2016   Procedure: CYSTOSCOPY WITH RETROGRADE PYELOGRAM/URETERAL STENT EXCHANGE;  Surgeon: Alexis Frock, MD;  Location: [facility];  Service: Urology;  Laterality: Right;   CYSTOSCOPY W/ URETERAL STENT PLACEMENT N/A 06/01/2017   Procedure: CYSTOSCOPY WITH EXAM UNDER ANESTHESIA;  Surgeon: Alexis Frock, MD;  [facility];  Service: Urology;  Laterality: N/A;   CYSTOSCOPY W/ URETERAL STENT PLACEMENT Right 08/17/2017   Procedure: CYSTOSCOPY WITH RETROGRADE PYELOGRAM/URETERAL STENT REMOVAL;  Surgeon: Alexis Frock, MD;   Location: [facility];  Service: Urology;  Laterality: Right;   CYSTOSCOPY WITH RETROGRADE PYELOGRAM, URETEROSCOPY AND STENT PLACEMENT Right 03/20/2015   Procedure: CYSTOSCOPY WITH RETROGRADE PYELOGRAM, URETEROSCOPY WITH BALLOON DILATION AND STENT PLACEMENT ON RIGHT;  Surgeon: Alexis Frock, MD;  Location: [facility];  Service: Urology;  Laterality: Right;   CYSTOSCOPY WITH RETROGRADE PYELOGRAM, URETEROSCOPY AND STENT PLACEMENT Right 05/02/2015   Procedure: CYSTOSCOPY WITH RIGHT RETROGRADE PYELOGRAM,  DIAGNOSTIC URETEROSCOPY AND STENT PULL ;  Surgeon: Alexis Frock, MD;  Location: [facility];  Service: Urology;  Laterality: Right;   CYSTOSCOPY WITH RETROGRADE PYELOGRAM, URETEROSCOPY AND STENT PLACEMENT Right 09/05/2015   Procedure: CYSTOSCOPY WITH RETROGRADE PYELOGRAM,  AND STENT PLACEMENT;  Surgeon: Alexis Frock, MD;  [facility];  Service: Urology;  Laterality: Right;   CYSTOSCOPY WITH RETROGRADE PYELOGRAM, URETEROSCOPY AND STENT PLACEMENT Right 04/01/2017   Procedure: CYSTOSCOPY WITH RETROGRADE PYELOGRAM, URETEROSCOPY AND STENT PLACEMENT;  Surgeon: Alexis Frock, MD;  [facility];  Service: Urology;  Laterality: Right;   CYSTOSCOPY WITH STENT PLACEMENT Right 10/27/2016   Procedure: CYSTOSCOPY WITH STENT  CHANGE and right retrograde pyelogram;  Surgeon: Alexis Frock, MD;  Location: [facility];  Service: Urology;  Laterality: Right;   EUS N/A 10/02/2014   Procedure: LOWER ENDOSCOPIC ULTRASOUND (EUS);  Surgeon: Arta Silence, MD;  Location: [facility];  Service: Endoscopy;  Laterality: N/A;   EXCISION SOFT TISSUE MASS RIGHT FOREMAN  12-08-2006   EYE SURGERY  as child   pytosis of eyelids repair   INCISION AND DRAINAGE OF WOUND Bilateral 12/26/2015   Procedure: DEBRIDEMENT OF BILATERAL MASTECTOMY FLAPS;  Surgeon: Irene Limbo, MD;  Location: [place];  Service: Plastics;  Laterality: Bilateral;    IR CV LINE INJECTION  05/31/2017   IR FLUORO GUIDE CV LINE LEFT  05/31/2017   IR FLUORO GUIDE CV LINE RIGHT  04/06/2017   IR FLUORO GUIDE CV MIDLINE PICC RIGHT  05/30/2017   IR NEPHROSTOGRAM LEFT INITIAL PLACEMENT  09/02/2017   IR NEPHROSTOGRAM LEFT THRU EXISTING ACCESS  11/29/2017   IR NEPHROSTOGRAM RIGHT INITIAL PLACEMENT  09/02/2017   IR NEPHROSTOGRAM RIGHT THRU EXISTING ACCESS  09/13/2017   IR NEPHROSTOGRAM RIGHT THRU EXISTING ACCESS  11/29/2017   IR NEPHROSTOMY EXCHANGE LEFT  11/28/2017   IR NEPHROSTOMY EXCHANGE LEFT  01/05/2018   IR NEPHROSTOMY EXCHANGE LEFT  02/16/2018   IR NEPHROSTOMY EXCHANGE LEFT  03/30/2018   IR NEPHROSTOMY EXCHANGE LEFT  05/12/2018   IR NEPHROSTOMY EXCHANGE LEFT  06/21/2018   IR NEPHROSTOMY EXCHANGE LEFT  08/04/2018   IR NEPHROSTOMY EXCHANGE LEFT  09/18/2018   IR NEPHROSTOMY EXCHANGE LEFT  10/09/2018   IR NEPHROSTOMY EXCHANGE LEFT  10/27/2018   IR NEPHROSTOMY EXCHANGE LEFT  11/21/2018   IR NEPHROSTOMY EXCHANGE LEFT  01/05/2019   IR NEPHROSTOMY EXCHANGE RIGHT  10/02/2017   IR NEPHROSTOMY EXCHANGE RIGHT  11/28/2017   IR NEPHROSTOMY EXCHANGE RIGHT  01/05/2018   IR NEPHROSTOMY EXCHANGE RIGHT  02/16/2018   IR NEPHROSTOMY EXCHANGE RIGHT  03/30/2018   IR NEPHROSTOMY EXCHANGE RIGHT  05/12/2018   IR NEPHROSTOMY EXCHANGE RIGHT  06/21/2018   IR NEPHROSTOMY EXCHANGE RIGHT  08/04/2018   IR NEPHROSTOMY EXCHANGE RIGHT  09/18/2018   IR NEPHROSTOMY EXCHANGE RIGHT  10/27/2018   IR NEPHROSTOMY EXCHANGE RIGHT  11/21/2018   IR NEPHROSTOMY EXCHANGE RIGHT  01/05/2019   IR NEPHROSTOMY PLACEMENT LEFT  10/02/2017   IR RADIOLOGIST EVAL & MGMT  05/03/2017   IR US GUIDE VASC ACCESS LEFT  05/31/2017   IR US GUIDE VASC ACCESS RIGHT  04/06/2017   IR US GUIDE VASC ACCESS RIGHT  05/30/2017   LAPAROSCOPIC CHOLECYSTECTOMY  1990   LIPOSUCTION WITH LIPOFILLING Bilateral 04/16/2016   Procedure: LIPOSUCTION WITH LIPOFILLING TO BILATERAL CHEST;  Surgeon: Irene Limbo, MD;  Location: [place];  Service: Plastics;  Laterality: Bilateral;   MASTECTOMY W/ SENTINEL NODE BIOPSY Bilateral 12/11/2015   Procedure: RIGHT PROPHYLACTIC MASTECTOMY, LEFT TOTAL MASTECTOMY WITH LEFT AXILLARY SENTINEL LYMPH NODE BIOPSY;  Surgeon: Stark Klein, MD;  Location: [place];  Service: General;  Laterality: Bilateral;   OSTOMY N/A 11/19/2014   Procedure: OSTOMY;  Surgeon: Michael [name], MD;  [facility];  Service: General;  Laterality: N/A;   PROCTOSCOPY N/A 04/01/2017   Procedure: RIDGE PROCTOSCOPY;  Surgeon: Michael [name], MD;  [facility];  Service: General;  Laterality: N/A;   REMOVAL OF BILATERAL TISSUE EXPANDERS WITH PLACEMENT OF BILATERAL BREAST IMPLANTS Bilateral 04/16/2016   Procedure: REMOVAL OF BILATERAL TISSUE EXPANDERS WITH PLACEMENT OF BILATERAL BREAST IMPLANTS;  Surgeon: Irene Limbo, MD;  [facility];  Service: Plastics;  Laterality: Bilateral;   ROBOTIC ASSISTED LAP VAGINAL HYSTERECTOMY N/A 11/19/2014   Procedure: ROBOTIC LYSIS OF ADHESIONS, CONVERTED TO LAPAROTOMY RADICAL UPPER VAGINECTOMY,LOW ANTERIOR BOWEL RESECTION, COLOSTOMY, BILATERAL URETERAL STENT PLACEMENT AND CYSTONOMY CLOSURE;  Surgeon: Everitt Amber, MD;  [facility];  Service: Gynecology;  Laterality: N/A;   TISSUE EXPANDER FILLING Bilateral 12/26/2015   Procedure: EXPANSION OF BILATERAL CHEST TISSUE EXPANDERS (60 mL- Right; 75 mL- Left);  Surgeon: Irene Limbo, MD;  Location: [place];  Service: Plastics;  Laterality: Bilateral;   TONSILLECTOMY     TOTAL ABDOMINAL HYSTERECTOMY  March 2006   [facility]   and Bilateral Salpingoophorectomy/  staging for Ovarian cancer/  an   XI ROBOTIC ASSISTED LOWER ANTERIOR RESECTION N/A 04/01/2017   Procedure: XI ROBOTIC VS LAPAROSCOPIC COLOSTOMY TAKEDOWN WITH LYSIS OF ADHESIONS.;  Surgeon: Michael [name], MD;  [facility];  Service: General;  Laterality: N/A;  ERAS PATHWAY     reports that she has never smoked. She  has never used smokeless tobacco. She reports previous alcohol use. She reports that she does not use drugs.  Allergies  Allergen Reactions   Penicillins Swelling    Facial swelling/childhood allergy Has patient had a PCN reaction causing immediate rash, facial/tongue/throat swelling, SOB or lightheadedness with hypotension: Yes Has patient had a PCN reaction causing severe rash involving mucus membranes or skin necrosis: Yes Has patient had a PCN reaction that required hospitalization yes Has patient had a PCN reaction occurring within the last 10 years: No If all of the above answers are "NO", then may proceed with Cephalosporin use.    Cefaclor Rash    Ceclor   Erythromycin Other (See Comments)    Gastritis, abd cramps   Tape Rash    blisters   Trimethoprim Rash   Ultram [Tramadol] Hives   Cephalosporins Rash   Fluconazole Rash   Oxycodone Other (See Comments)    " I just feel weird"   Pectin Rash    Pectin ring for stoma   Septra [Sulfamethoxazole-Trimethoprim] Rash   Sulfa Antibiotics Rash    Family History  Problem Relation Age of Onset   Cancer Mother 90       stomach ca   Hypertension Mother    Cancer Father 40       prostate ca   Diabetes Father    Heart disease Father        CABG   Hypertension Father    Hyperlipidemia Father    Obesity Father    Breast cancer Maternal Aunt        dx in her 77s   Lymphoma Paternal 68    Brain cancer Paternal Grandfather    Ovarian cancer Other    Diabetes Sister    Hypertension Brother y-10   Heart disease Brother        CABG   Diabetes Brother     Prior to Admission medications   Medication Sig Start Date End Date Taking? Authorizing Provider  acetaminophen (TYLENOL) 325 MG tablet Take 2 tablets (650 mg total) by mouth every 6 (six) hours as needed. 09/24/17  Yes Emokpae, Courage, MD  anastrozole (ARIMIDEX) 1 MG tablet TAKE 1 TABLET DAILY Patient taking differently: Take 1 mg by mouth  daily.  01/30/18  Yes Gorsuch, Ni, MD  Biotin 5 MG TABS Take 5 mg by mouth daily.    Yes [provider]  buPROPion (WELLBUTRIN SR) 200 MG 12 hr tablet Take 1 tablet (  200 mg total) by mouth daily. 01/09/19  Yes Beasley, Caren D, MD  Cholecalciferol (VITAMIN D3) 10000 UNITS capsule Take 10,000 Units by mouth every Sunday.    Yes [provider]  clobetasol (OLUX) 0.05 % topical foam Apply topically 2 (two) times daily.   Yes [provider]  diphenhydrAMINE (BENADRYL) 25 mg capsule Take 1 capsule (25 mg total) by mouth every 8 (eight) hours as needed for itching, allergies or sleep. 09/24/17  Yes Emokpae, Courage, MD  diphenoxylate-atropine (LOMOTIL) 2.5-0.025 MG tablet Take 1 tablet by mouth 4 (four) times daily as needed for diarrhea or loose stools. TO PREVENT LOOSE BOWEL MOVEMENTS 09/24/17  Yes Emokpae, Courage, MD  fentaNYL (DURAGESIC) 50 MCG/HR Place 1 patch onto the skin every 3 (three) days. 01/08/19  Yes [name] R, DO  ferrous sulfate 325 (65 FE) MG tablet Take 1 tablet (325 mg total) by mouth at bedtime. 09/24/17  Yes Emokpae, Courage, MD  filgrastim-sndz (ZARXIO) 480 MCG/0.8ML SOSY injection Inject 0.8 mLs (480 mcg total) into the skin as directed. Inject 480 mcg every 5 days 12/04/18 12/04/19 Yes Gorsuch, Ni, MD  HYDROcodone-acetaminophen (NORCO/VICODIN) 5-325 MG tablet Take 2 tablets by mouth every 6 (six) hours as needed for moderate pain. 01/08/19  Yes Lowne Chase, Yvonne R, DO  JANUVIA 50 MG tablet Take 50 mg by mouth daily.  10/28/17  Yes [provider]  levothyroxine (SYNTHROID, LEVOTHROID) 150 MCG tablet Take 1 tablet (150 mcg total) by mouth daily before breakfast. 09/25/17  Yes Emokpae, Courage, MD  loratadine (CLARITIN) 10 MG tablet Take 10 mg by mouth daily.    Yes [provider]  nitrofurantoin, macrocrystal-monohydrate, (MACROBID) 100 MG capsule Take 100 mg by mouth at bedtime.   Yes [provider]  omega-3 acid ethyl esters  (LOVAZA) 1 G capsule Take 1 g by mouth 2 (two) times daily.    Yes [provider]  Polyethyl Glycol-Propyl Glycol (SYSTANE OP) Place 1 drop into both eyes daily as needed (dry eyes).    Yes [provider]  pregabalin (LYRICA) 50 MG capsule TAKE 1 CAPSULE(50 MG) BY MOUTH TWICE DAILY Patient taking differently: Take 50 mg by mouth 2 (two) times daily.  10/02/18  Yes Ann Held, DO  Prenatal Vit-Fe Fumarate-FA (PRENATAL VITAMIN PO) Take 1 capsule by mouth daily. Takes prenatal because there are no dyes in it   Yes [provider]  rosuvastatin (CRESTOR) 10 MG tablet Take 10 mg by mouth every evening.    Yes [provider]  Saccharomyces boulardii (FLORASTOR PO) Take 1 capsule by mouth daily. 02/16/18  Yes [provider]    Physical Exam: Vitals:   01/17/19 0320 01/17/19 0328 01/17/19 0409 01/17/19 0500  BP:  (!) 175/72 (!) 139/56 (!) 157/51  Pulse:  67 72 71  Resp:  _0 Temp: 97.6 F (36.4 C)     TempSrc: Oral     SpO2:  93% 99% 100%  Weight:      Height:          Constitutional: Moderately built and nourished. Vitals:   01/17/19 0320 01/17/19 0328 01/17/19 0409 01/17/19 0500  BP:  (!) 175/72 (!) 139/56 (!) 157/51  Pulse:  67 72 71  Resp:  _1 Temp: 97.6 F (36.4 C)     TempSrc: Oral     SpO2:  93% 99% 100%  Weight:      Height:  Eyes: Anicteric no pallor. ENMT: No discharge from the ears eyes nose or mouth. Neck: No mass felt.  No neck rigidity. Respiratory: No rhonchi or crepitations. Cardiovascular: S1-S2 heard. Abdomen: Soft bowel sounds not appreciated nephrostomy tubes and ileostomy seen. Musculoskeletal: No edema. Skin: No rash. Neurologic: Alert awake oriented to time place and person.  Moves all extremities. Psychiatric: Appears normal.  Normal affect.   Labs on Admission: I have personally reviewed following labs and imaging studies  CBC: Recent Labs  Lab 01/17/19 0351  WBC 13.7*    NEUTROABS 11.7*  HGB 11.5*  HCT 37.6  MCV 96.2  PLT 154   Basic Metabolic Panel: Recent Labs  Lab 01/17/19 0351  NA 141  K 4.0  CL 101  CO2 28  GLUCOSE 164*  BUN 24*  CREATININE 1.52*  CALCIUM 9.4   GFR: Estimated Creatinine Clearance: 38.3 mL/min (A) (by C-G formula based on SCr of 1.52 mg/dL (H)). Liver Function Tests: Recent Labs  Lab 01/17/19 0351  AST 24  ALT 53*  ALKPHOS 305*  BILITOT 0.6  PROT 7.4  ALBUMIN 2.9*   Recent Labs  Lab 01/17/19 0351  LIPASE 27   No results for input(s): AMMONIA in the last 168 hours. Coagulation Profile: No results for input(s): INR, PROTIME in the last 168 hours. Cardiac Enzymes: No results for input(s): CKTOTAL, CKMB, CKMBINDEX, TROPONINI in the last 168 hours. BNP (last 3 results) No results for input(s): PROBNP in the last 8760 hours. HbA1C: No results for input(s): HGBA1C in the last 72 hours. CBG: No results for input(s): GLUCAP in the last 168 hours. Lipid Profile: No results for input(s): CHOL, HDL, LDLCALC, TRIG, CHOLHDL, LDLDIRECT in the last 72 hours. Thyroid Function Tests: No results for input(s): TSH, T4TOTAL, FREET4, T3FREE, THYROIDAB in the last 72 hours. Anemia Panel: No results for input(s): VITAMINB12, FOLATE, FERRITIN, TIBC, IRON, RETICCTPCT in the last 72 hours. Urine analysis:    Component Value Date/Time   COLORURINE YELLOW 10/04/2018 1320   APPEARANCEUR HAZY (A) 10/04/2018 1320   LABSPEC 1.017 10/04/2018 1320   LABSPEC 1.015 11/17/2015 1436   PHURINE 6.0 10/04/2018 1320   GLUCOSEU NEGATIVE 10/04/2018 1320   GLUCOSEU 2,000 11/17/2015 1436   HGBUR LARGE (A) 10/04/2018 1320   BILIRUBINUR NEGATIVE 10/04/2018 1320   BILIRUBINUR neg 08/22/2018 1409   BILIRUBINUR Negative 11/17/2015 1436   KETONESUR NEGATIVE 10/04/2018 1320   PROTEINUR 100 (A) 10/04/2018 1320   UROBILINOGEN 0.2 08/22/2018 1409   UROBILINOGEN 0.2 11/17/2015 1436   NITRITE NEGATIVE 10/04/2018 1320   LEUKOCYTESUR MODERATE (A)  10/04/2018 1320   LEUKOCYTESUR Moderate 11/17/2015 1436   Sepsis Labs: _0 (procalcitonin:4,lacticidven:4) )No results found for this or any previous visit (from the past 240 hour(s)).   Radiological Exams on Admission: Ct Abdomen Pelvis W Contrast  Result Date: 01/17/2019 CLINICAL DATA:  Abdominal pain, unspecified. EXAM: CT ABDOMEN AND PELVIS WITH CONTRAST TECHNIQUE: Multidetector CT imaging of the abdomen and pelvis was performed using the standard protocol following bolus administration of intravenous contrast. CONTRAST:  35m OMNIPAQUE IOHEXOL 300 MG/ML  SOLN COMPARISON:  CT abdomen and pelvis 01/02/2019 FINDINGS: Lower chest: Lung bases are clear without focal nodule, mass, or airspace disease. The heart size is normal. No significant pleural or pericardial effusion is present. Hepatobiliary: Mild diffuse fatty infiltration of the liver is present. No discrete lesions are evident. There is mild intra and extrahepatic biliary dilation following cholecystectomy. The biliary ducts are slightly more prominent than on the prior study. No obstructing lesion is  evident. Pancreas: Unremarkable. No pancreatic ductal dilatation or surrounding inflammatory changes. Spleen: Normal in size without focal abnormality. Adrenals/Urinary Tract: Adrenal glands are normal bilaterally. Bilateral percutaneous nephrostomy catheters are in place. The collecting systems are decompressed. There is no stone or mass lesion in either kidney. Ureters are within normal limits. A Foley catheter is present in the urinary bladder. Stomach/Bowel: Stomach is within normal limits. Duodenum is unremarkable. Dilated loops of small bowel extend into the left upper quadrant. There are multiple small ventral hernias. The transition point appears to be just below the umbilicus. The obstruction is most likely due to adhesions. Although there is a small ventral hernia, no definite strangulated bowel is evident. A loops of more distal  decompressed bowel extends into a more superior left ventral hernia. This is chronic. The ostomy is decompressed. Residual colon is unremarkable. Vascular/Lymphatic: Presacral soft tissue is stable. Retroperitoneal dissection is noted. No new adenopathy is present. Reproductive: Postsurgical changes are noted. Other: No free fluid is present.  There is no free air. Musculoskeletal: Vertebral body heights and alignment are maintained. Slight degenerative anterolisthesis at L3-4 is again noted. There straightening of the normal lumbar lordosis. Bony pelvis is within normal limits. The hips are located and normal. IMPRESSION: 1. Small bowel obstruction. Transition is near the umbilicus, likely related to adhesions. No definite strangulated bowel is present. 2. Decompressed small bowel extends into a separate ventral hernia more superiorly and to the left. 3. Ostomy is decompressed. 4. Bilateral percutaneous nephrostomy drains in place. Collecting systems are decompressed. 5. Foley catheter in place. 6. Stable postsurgical changes of the pelvis. These results were called by telephone at the time of interpretation on 01/17/2019 at 4:30 am to [facility] , who verbally acknowledged these results. Electronically Signed   By: San Morelle M.D.   On: 01/17/2019 04:33     Assessment/Plan Principal Problem:   SBO (small bowel obstruction) (HCC) Active Problems:   Primary hypothyroidism   Benign essential HTN   Type II diabetes mellitus with renal manifestations ([place])    1. Small bowel obstruction -General surgery has been consulted.  We will keep patient n.p.o. NG tube suction IV fluids pain relief medications. 2. Hypothyroidism on Synthroid which will be dosed IV. 3. Diabetes mellitus type 2 we will keep patient on sliding scale coverage. 4. Chronic kidney disease stage III creatinine appears to be at baseline follow metabolic panel. 5. Mild anemia follow CBC. 6. Hypertension is mention in the  chart but patient states he does not take medications. 7. Endometrial ovarian and breast cancer being followed by oncologist.  Has had surgery complicated and presently has a nephrostomy and ileostomy.  Patient also has a Foley.   DVT prophylaxis: SCDs in anticipation of possible procedure. Code Status: Full code. Family Communication: Discussed with patient. Disposition Plan: Home. Consults called: General surgery. Admission status: Inpatient.   Rise Patience MD [facility] Pager [phone].  If 7PM-7AM, please contact night-coverage [email or web address] Password [email or web address]  01/17/2019, 5:10 AM

## 2019-01-17 NOTE — ED Notes (Signed)
Patient transported to CT 

## 2019-01-17 NOTE — ED Notes (Signed)
ED TO INPATIENT HANDOFF REPORT  ED Nurse Name and Phone #:  Pierre Bali  S Name/Age/Gender Carmelia Bake Prest 63 y.o. female Room/Bed: 034C/034C  Code Status   Code Status: Full Code  Home/SNF/Other Home Patient oriented to: self, place, time and situation Is this baseline? Yes   Triage Complete: Triage complete  Chief Complaint abd pain vomiting  Triage Note Pt. From home c/o abdominal pain and NV. Abdominal pain on LLQ starting 2 nights ago. Pain 5 on scale 0-10. Pt. States eating causes vomiting making pain worsen. Pt. States movement worsens pain. Vomiting x15 last 24 hours bile color.   Pt. ileostomy since Feb. 2019.    Allergies Allergies  Allergen Reactions  . Penicillins Swelling    Facial swelling/childhood allergy Has patient had a PCN reaction causing immediate rash, facial/tongue/throat swelling, SOB or lightheadedness with hypotension: Yes Has patient had a PCN reaction causing severe rash involving mucus membranes or skin necrosis: Yes Has patient had a PCN reaction that required hospitalization yes Has patient had a PCN reaction occurring within the last 10 years: No If all of the above answers are "NO", then may proceed with Cephalosporin use.   . Cefaclor Rash    Ceclor  . Erythromycin Other (See Comments)    Gastritis, abd cramps  . Tape Rash    blisters  . Trimethoprim Rash  . Ultram [Tramadol] Hives  . Cephalosporins Rash  . Fluconazole Rash  . Oxycodone Other (See Comments)    " I just feel weird"  . Pectin Rash    Pectin ring for stoma  . Septra [Sulfamethoxazole-Trimethoprim] Rash  . Sulfa Antibiotics Rash    Level of Care/Admitting Diagnosis ED Disposition    ED Disposition Condition [facility] Area: [place] [[id number]]  Level of Care: Telemetry Medical [104]  Covid Evaluation: Screening Protocol (No Symptoms)  Diagnosis: SBO (small bowel obstruction) [facility]) [[id number]]  Admitting Physician: Rise Patience [phone]  Attending Physician: Rise Patience [phone]  Estimated length of stay: past midnight tomorrow  Certification:: I certify this patient will need inpatient services for at least 2 midnights  PT Class (Do Not Modify): Inpatient [101]  PT Acc Code (Do Not Modify): Private [1]       B Medical/Surgery History Past Medical History:  Diagnosis Date  . Adrenal adenoma, left 02/08/2016   CT: stable benign  . Anemia in neoplastic disease   . Back pain   . Benign essential HTN   . 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 Cancer of corpus uteri, except isthmus [facility])  oncologist-- dr Denman [name] and dr Alvy Bimler    10-15-2004  dx endometroid endometrial and ovarian cancer s/p  chemotheapy and surgery(TAH w/ BSO) :  recurrent 11-19-2014 post pelvic surgery and radiation 01-29-2015 to 03-10-2015  . Chronic idiopathic neutropenia (HCC)    presumed related to chemotherapy March 2006--- followed by dr Alvy Bimler (treatment w/ G-CSF injections  . Chronic nausea   . Chronic pain    perineal/ anal  area from bladder pad irritates skin , right flank pain  . CKD stage G2/A3, GFR 60-89 and albumin creatinine ratio >300 mg/g    nephrologist-  dr Madelon Lips  . Colovesical fistula   . Diabetic retinopathy, background ([place])   . Difficult intravenous access  small veins--- hx PICC lines  . DM type 2 (diabetes mellitus, type 2) ([place])    monitored by dr Legrand Como altheimer  . Dysuria   . Environmental and seasonal allergies   . Fatty liver 02/08/2016   CT  . Generalized muscle weakness   . GERD (gastroesophageal reflux disease)   . Hiatal hernia   . History of abdominal abscess 04/16/2017   post surgery 04-01-2017  --- resolved 10/ 2018  . History of gastric polyp    2014  duodenum  . History of  ileus 04/16/2017   resolved w/ no surgical intervention  . History of radiation therapy    01-29-2015 to 03-10-2015  pelvis 50.4Gy  . Hypothyroidism    monitored by dr Legrand Como altheimer  . IBS (irritable bowel syndrome)   . Ileostomy in place [facility]) 04/01/2017   created at same time colostomy takedown.  . Joint pain   . Leg edema   . Lower urinary tract symptoms (LUTS)    urge urinary  incontinence  . Mixed dyslipidemia   . Multiple thyroid nodules    Managed by Dr. Harlow Asa  . Nephrostomy status ([place])   . Palpitations   . Pelvic abscess in female 04/16/2017  . PONV (postoperative nausea and vomiting)    "scopolamine patch works for me"  . Radiation-induced dermatitis    contact dermatitis , radiation completed, rash only on ankles now.  . Seasonal allergies   . Ureteral stricture, right UROLOGIT-  DR [facility]   CHRONIC--  TREATMENT URETERAL STENT  . 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 Vitamin D deficiency   . Wears glasses    Past Surgical History:  Procedure Laterality Date  . APPENDECTOMY    . biopsy thyroid nodules    . BREAST RECONSTRUCTION WITH PLACEMENT OF TISSUE EXPANDER AND FLEX HD (ACELLULAR HYDRATED DERMIS) Bilateral 12/11/2015   Procedure: BILATERAL BREAST RECONSTRUCTION WITH PLACEMENT OF TISSUE EXPANDERS;  Surgeon: Irene Limbo, MD;  Location: [place];  Service: Plastics;  Laterality: Bilateral;  . COLONOSCOPY WITH PROPOFOL N/A 08/21/2013   Procedure: COLONOSCOPY WITH PROPOFOL;  Surgeon: Cleotis Nipper, MD;  [facility];  Service: Endoscopy;  Laterality: N/A;  . COLOSTOMY TAKEDOWN N/A 12/04/2014   Procedure: LAPROSCOPIC LYSIS OF ADHESIONS, SPLENIC MOBILIZATION, RELOCATION OF COLOSTOMY, DEBRIDEMENT INITIAL COLOSTOMY SITE;  Surgeon: Michael [name], MD;  [facility];  Service: General;  Laterality: N/A;  . CYSTOGRAM N/A 06/01/2017   Procedure: CYSTOGRAM;  Surgeon: Alexis Frock, MD;  [facility];  Service: Urology;  Laterality: N/A;  .  CYSTOSCOPY W/ RETROGRADES Right 11/21/2015   Procedure: CYSTOSCOPY WITH RETROGRADE PYELOGRAM;  Surgeon: Alexis Frock, MD;  [facility];  Service: Urology;  Laterality: Right;  . CYSTOSCOPY W/ URETERAL STENT PLACEMENT Right 11/21/2015   Procedure: CYSTOSCOPY WITH STENT REPLACEMENT;  Surgeon: Alexis Frock, MD;  [facility];  Service: Urology;  Laterality: Right;  . CYSTOSCOPY W/ URETERAL STENT PLACEMENT Right 03/10/2016   Procedure: CYSTOSCOPY WITH STENT REPLACEMENT;  Surgeon: Alexis Frock, MD;  Location[facility];  Service: Urology;  Laterality: Right;  . CYSTOSCOPY W/ URETERAL STENT PLACEMENT Right 06/30/2016   Procedure: CYSTOSCOPY WITH RETROGRADE PYELOGRAM/URETERAL STENT EXCHANGE;  Surgeon: Alexis Frock, MD;  Location: [facility];  Service: Urology;  Laterality: Right;  . CYSTOSCOPY W/ URETERAL STENT PLACEMENT N/A 06/01/2017   Procedure: CYSTOSCOPY WITH EXAM UNDER ANESTHESIA;  Surgeon: Alexis Frock, MD;  [facility];  Service: Urology;  Laterality: N/A;  . CYSTOSCOPY W/ URETERAL  STENT PLACEMENT Right 08/17/2017   Procedure: CYSTOSCOPY WITH RETROGRADE PYELOGRAM/URETERAL STENT REMOVAL;  Surgeon: Alexis Frock, MD;  Location: [facility];  Service: Urology;  Laterality: Right;  . CYSTOSCOPY WITH RETROGRADE PYELOGRAM, URETEROSCOPY AND STENT PLACEMENT Right 03/20/2015   Procedure: CYSTOSCOPY WITH RETROGRADE PYELOGRAM, URETEROSCOPY WITH BALLOON DILATION AND STENT PLACEMENT ON RIGHT;  Surgeon: Alexis Frock, MD;  Location: [facility];  Service: Urology;  Laterality: Right;  . CYSTOSCOPY WITH RETROGRADE PYELOGRAM, URETEROSCOPY AND STENT PLACEMENT Right 05/02/2015   Procedure: CYSTOSCOPY WITH RIGHT RETROGRADE PYELOGRAM,  DIAGNOSTIC URETEROSCOPY AND STENT PULL ;  Surgeon: Alexis Frock, MD;  Location: [facility];  Service: Urology;  Laterality: Right;  . CYSTOSCOPY WITH RETROGRADE PYELOGRAM, URETEROSCOPY  AND STENT PLACEMENT Right 09/05/2015   Procedure: CYSTOSCOPY WITH RETROGRADE PYELOGRAM,  AND STENT PLACEMENT;  Surgeon: Alexis Frock, MD;  [facility];  Service: Urology;  Laterality: Right;  . CYSTOSCOPY WITH RETROGRADE PYELOGRAM, URETEROSCOPY AND STENT PLACEMENT Right 04/01/2017   Procedure: CYSTOSCOPY WITH RETROGRADE PYELOGRAM, URETEROSCOPY AND STENT PLACEMENT;  Surgeon: Alexis Frock, MD;  [facility];  Service: Urology;  Laterality: Right;  . CYSTOSCOPY WITH STENT PLACEMENT Right 10/27/2016   Procedure: CYSTOSCOPY WITH STENT CHANGE and right retrograde pyelogram;  Surgeon: Alexis Frock, MD;  Location[facility];  Service: Urology;  Laterality: Right;  . EUS N/A 10/02/2014   Procedure: LOWER ENDOSCOPIC ULTRASOUND (EUS);  Surgeon: Arta Silence, MD;  Location: [facility];  Service: Endoscopy;  Laterality: N/A;  . EXCISION SOFT TISSUE MASS RIGHT FOREMAN  12-08-2006  . EYE SURGERY  as child   pytosis of eyelids repair  . INCISION AND DRAINAGE OF WOUND Bilateral 12/26/2015   Procedure: DEBRIDEMENT OF BILATERAL MASTECTOMY FLAPS;  Surgeon: Irene Limbo, MD;  Location: [place];  Service: Plastics;  Laterality: Bilateral;  . IR CV LINE INJECTION  05/31/2017  . IR FLUORO GUIDE CV LINE LEFT  05/31/2017  . IR FLUORO GUIDE CV LINE RIGHT  04/06/2017  . IR FLUORO GUIDE CV MIDLINE PICC RIGHT  05/30/2017  . IR NEPHROSTOGRAM LEFT INITIAL PLACEMENT  09/02/2017  . IR NEPHROSTOGRAM LEFT THRU EXISTING ACCESS  11/29/2017  . IR NEPHROSTOGRAM RIGHT INITIAL PLACEMENT  09/02/2017  . IR NEPHROSTOGRAM RIGHT THRU EXISTING ACCESS  09/13/2017  . IR NEPHROSTOGRAM RIGHT THRU EXISTING ACCESS  11/29/2017  . IR NEPHROSTOMY EXCHANGE LEFT  11/28/2017  . IR NEPHROSTOMY EXCHANGE LEFT  01/05/2018  . IR NEPHROSTOMY EXCHANGE LEFT  02/16/2018  . IR NEPHROSTOMY EXCHANGE LEFT  03/30/2018  . IR NEPHROSTOMY EXCHANGE LEFT  05/12/2018  . IR NEPHROSTOMY EXCHANGE LEFT  06/21/2018  . IR NEPHROSTOMY  EXCHANGE LEFT  08/04/2018  . IR NEPHROSTOMY EXCHANGE LEFT  09/18/2018  . IR NEPHROSTOMY EXCHANGE LEFT  10/09/2018  . IR NEPHROSTOMY EXCHANGE LEFT  10/27/2018  . IR NEPHROSTOMY EXCHANGE LEFT  11/21/2018  . IR NEPHROSTOMY EXCHANGE LEFT  01/05/2019  . IR NEPHROSTOMY EXCHANGE RIGHT  10/02/2017  . IR NEPHROSTOMY EXCHANGE RIGHT  11/28/2017  . IR NEPHROSTOMY EXCHANGE RIGHT  01/05/2018  . IR NEPHROSTOMY EXCHANGE RIGHT  02/16/2018  . IR NEPHROSTOMY EXCHANGE RIGHT  03/30/2018  . IR NEPHROSTOMY EXCHANGE RIGHT  05/12/2018  . IR NEPHROSTOMY EXCHANGE RIGHT  06/21/2018  . IR NEPHROSTOMY EXCHANGE RIGHT  08/04/2018  . IR NEPHROSTOMY EXCHANGE RIGHT  09/18/2018  . IR NEPHROSTOMY EXCHANGE RIGHT  10/27/2018  . IR NEPHROSTOMY EXCHANGE RIGHT  11/21/2018  . IR NEPHROSTOMY EXCHANGE RIGHT  01/05/2019  . IR NEPHROSTOMY  PLACEMENT LEFT  10/02/2017  . IR RADIOLOGIST EVAL & MGMT  05/03/2017  . IR US GUIDE VASC ACCESS LEFT  05/31/2017  . IR US GUIDE VASC ACCESS RIGHT  04/06/2017  . IR US GUIDE VASC ACCESS RIGHT  05/30/2017  . LAPAROSCOPIC CHOLECYSTECTOMY  1990  . LIPOSUCTION WITH LIPOFILLING Bilateral 04/16/2016   Procedure: LIPOSUCTION WITH LIPOFILLING TO BILATERAL CHEST;  Surgeon: Irene Limbo, MD;  Location: [place];  Service: Plastics;  Laterality: Bilateral;  . MASTECTOMY W/ SENTINEL NODE BIOPSY Bilateral 12/11/2015   Procedure: RIGHT PROPHYLACTIC MASTECTOMY, LEFT TOTAL MASTECTOMY WITH LEFT AXILLARY SENTINEL LYMPH NODE BIOPSY;  Surgeon: Stark Klein, MD;  Location: [place];  Service: General;  Laterality: Bilateral;  . OSTOMY N/A 11/19/2014   Procedure: OSTOMY;  Surgeon: Michael [name], MD;  [facility];  Service: General;  Laterality: N/A;  . PROCTOSCOPY N/A 04/01/2017   Procedure: RIDGE PROCTOSCOPY;  Surgeon: Michael [name], MD;  [facility];  Service: General;  Laterality: N/A;  . REMOVAL OF BILATERAL TISSUE EXPANDERS WITH PLACEMENT OF BILATERAL BREAST IMPLANTS Bilateral 04/16/2016   Procedure: REMOVAL OF  BILATERAL TISSUE EXPANDERS WITH PLACEMENT OF BILATERAL BREAST IMPLANTS;  Surgeon: Irene Limbo, MD;  Location: [place];  Service: Plastics;  Laterality: Bilateral;  . ROBOTIC ASSISTED LAP VAGINAL HYSTERECTOMY N/A 11/19/2014   Procedure: ROBOTIC LYSIS OF ADHESIONS, CONVERTED TO LAPAROTOMY RADICAL UPPER VAGINECTOMY,LOW ANTERIOR BOWEL RESECTION, COLOSTOMY, BILATERAL URETERAL STENT PLACEMENT AND CYSTONOMY CLOSURE;  Surgeon: Everitt Amber, MD;  [facility];  Service: Gynecology;  Laterality: N/A;  . TISSUE EXPANDER FILLING Bilateral 12/26/2015   Procedure: EXPANSION OF BILATERAL CHEST TISSUE EXPANDERS (60 mL- Right; 75 mL- Left);  Surgeon: Irene Limbo, MD;  Location: [place];  Service: Plastics;  Laterality: Bilateral;  . TONSILLECTOMY    . TOTAL ABDOMINAL HYSTERECTOMY  March 2006   [facility]   and Bilateral Salpingoophorectomy/  staging for Ovarian cancer/  an  . XI ROBOTIC ASSISTED LOWER ANTERIOR RESECTION N/A 04/01/2017   Procedure: XI ROBOTIC VS LAPAROSCOPIC COLOSTOMY TAKEDOWN WITH LYSIS OF ADHESIONS.;  Surgeon: Michael [name], MD;  [facility];  Service: General;  Laterality: N/A;  ERAS PATHWAY     A IV Location/Drains/Wounds Patient Lines/Drains/Airways Status   Active Line/Drains/Airways    Name:   Placement date:   Placement time:   Site:   Days:   PICC Double Lumen 62/56/38 PICC Right Basilic 35 cm 0 cm   93/73/42    1628     462   PICC Double Lumen PICC Right   -    -        Closed System Drain 1 Right Abdomen Other (Comment)   -    -    Abdomen      Open Drain 1 Anterior;Right Abdomen 10 Fr.   04/01/17    2145    Abdomen   656   Nephrostomy Left 12 Fr.   11/21/18    1615    Left   57   Nephrostomy Right 12 Fr.   11/21/18    1618    Right   57   NG/OG Tube Nasogastric 12 Fr. Left nare Aucultation;Xray Measured external length of tube   01/17/19    0509    Left nare   less than 1   Colostomy LUQ   12/04/14    1630    LUQ   1505   Colostomy  LUQ   09/02/14    1200  LUQ   1598   Colostomy RUQ   04/01/17    2144    RUQ   656   Ileostomy RUQ   -    -    RUQ      Urethral Catheter K[place], RN Latex 14 Fr.   10/02/17    1257    Latex   472   Urethral Catheter   -    -    -      Ureteral Drain/Stent Right ureter 4.8 Fr.   11/19/14    1559    Right ureter   1520   Ureteral Drain/Stent Right ureter 6 Fr.   06/30/16    -    Right ureter   931   Ureteral Drain/Stent Right ureter 6 Fr.   04/01/17    0958    Right ureter   656   Incision (Closed) 03/10/16 Vagina Other (Comment)   03/10/16    0832     1043   Incision (Closed) 04/16/16 Breast Left   04/16/16    1414     1006   Incision (Closed) 04/16/16 Breast Right   04/16/16    1414     1006   Incision (Closed) 06/30/16 Vagina Other (Comment)   06/30/16    0844     931   Incision (Closed) 10/27/16 Vagina Other (Comment)   10/27/16    0713     812   Incision (Closed) 04/01/17 Abdomen   04/01/17    2143     656   Pressure Injury 10/02/17 Unstageable - Full thickness tissue loss in which the base of the ulcer is covered by slough (yellow, tan, gray, green or brown) and/or eschar (tan, brown or black) in the wound bed. 1x1 cm    10/02/17    2300     472   Pressure Injury 10/02/17 Stage I -  Intact skin with non-blanchable redness of a localized area usually over a bony prominence. left heel boggy   10/02/17    2300     472   Pressure Injury 10/12/17 Stage III -  Full thickness tissue loss. Subcutaneous fat may be visible but bone, tendon or muscle are NOT exposed. Most Lateral  R sacral wound    10/12/17    1106     462   Pressure Injury 10/12/17 Stage III -  Full thickness tissue loss. Subcutaneous fat may be visible but bone, tendon or muscle are NOT exposed. Medial R sacral wound   10/12/17    1109     462   Pressure Injury 10/12/17 Stage III -  Full thickness tissue loss. Subcutaneous fat may be visible but bone, tendon or muscle are NOT exposed. Central Sacral Wound    10/12/17    1113     462    Pressure Injury 11/29/17 Stage II -  Partial thickness loss of dermis presenting as a shallow open ulcer with a red, pink wound bed without slough. Healing stage 2    11/29/17    1844     414          Intake/Output Last 24 hours  Intake/Output Summary (Last 24 hours) at 01/17/2019 0515 Last data filed at 01/17/2019 9381 Gross per 24 hour  Intake 1000 ml  Output -  Net 1000 ml    Labs/Imaging Results for orders placed or performed during the hospital encounter of 01/17/19 (from the past 48 hour(s))  CBC with Differential  Status: Abnormal   Collection Time: 01/17/19  3:51 AM  Result Value Ref Range   WBC 13.7 (H) 4.0 - 10.5 K/uL   RBC 3.91 3.87 - 5.11 MIL/uL   Hemoglobin 11.5 (L) 12.0 - 15.0 g/dL   HCT 37.6 36.0 - 46.0 %   MCV 96.2 80.0 - 100.0 fL   MCH 29.4 26.0 - 34.0 pg   MCHC 30.6 30.0 - 36.0 g/dL   RDW 14.1 11.5 - 15.5 %   Platelets 279 150 - 400 K/uL   nRBC 0.0 0.0 - 0.2 %   Neutrophils Relative % 85 %   Neutro Abs 11.7 (H) 1.7 - 7.7 K/uL   Lymphocytes Relative 4 %   Lymphs Abs 0.6 (L) 0.7 - 4.0 K/uL   Monocytes Relative 7 %   Monocytes Absolute 1.0 0.1 - 1.0 K/uL   Eosinophils Relative 0 %   Eosinophils Absolute 0.0 0.0 - 0.5 K/uL   Basophils Relative 1 %   Basophils Absolute 0.1 0.0 - 0.1 K/uL   Immature Granulocytes 3 %   Abs Immature Granulocytes 0.38 (H) 0.00 - 0.07 K/uL    Comment: Performed at [facility], [address] [place], [place] [address]  Comprehensive metabolic panel     Status: Abnormal   Collection Time: 01/17/19  3:51 AM  Result Value Ref Range   Sodium 141 135 - 145 mmol/L   Potassium 4.0 3.5 - 5.1 mmol/L   Chloride 101 98 - 111 mmol/L   CO2 28 22 - 32 mmol/L   Glucose, Bld 164 (H) 70 - 99 mg/dL   BUN 24 (H) 8 - 23 mg/dL   Creatinine, Ser 1.52 (H) 0.44 - 1.00 mg/dL   Calcium 9.4 8.9 - 10.3 mg/dL   Total Protein 7.4 6.5 - 8.1 g/dL   Albumin 2.9 (L) 3.5 - 5.0 g/dL   AST 24 15 - 41 U/L   ALT 53 (H) 0 - 44 U/L   Alkaline  Phosphatase 305 (H) 38 - 126 U/L   Total Bilirubin 0.6 0.3 - 1.2 mg/dL   GFR calc non Af Amer 36 (L) >60 mL/min   GFR calc Af Amer 42 (L) >60 mL/min   Anion gap 12 5 - 15    Comment: Performed at [facility] [address], [place], [place] [address]  Lipase, blood     Status: None   Collection Time: 01/17/19  3:51 AM  Result Value Ref Range   Lipase 27 11 - 51 U/L    Comment: Performed at [facility], [place] [address] [place], [place] [address]  Lactic acid, plasma     Status: None   Collection Time: 01/17/19  3:51 AM  Result Value Ref Range   Lactic Acid, Venous 1.8 0.5 - 1.9 mmol/L    Comment: Performed at [facility] [address], [place], [place] [address]   *Note: Due to a large number of results and/or encounters for the requested time period, some results have not been displayed. A complete set of results can be found in Results Review.   Ct Abdomen Pelvis W Contrast  Result Date: 01/17/2019 CLINICAL DATA:  Abdominal pain, unspecified. EXAM: CT ABDOMEN AND PELVIS WITH CONTRAST TECHNIQUE: Multidetector CT imaging of the abdomen and pelvis was performed using the standard protocol following bolus administration of intravenous contrast. CONTRAST:  31m OMNIPAQUE IOHEXOL 300 MG/ML  SOLN COMPARISON:  CT abdomen and pelvis 01/02/2019 FINDINGS: Lower chest: Lung bases are  clear without focal nodule, mass, or airspace disease. The heart size is normal. No significant pleural or pericardial effusion is present. Hepatobiliary: Mild diffuse fatty infiltration of the liver is present. No discrete lesions are evident. There is mild intra and extrahepatic biliary dilation following cholecystectomy. The biliary ducts are slightly more prominent than on the prior study. No obstructing lesion is evident. Pancreas: Unremarkable. No pancreatic ductal dilatation or surrounding inflammatory changes. Spleen: Normal in size without focal abnormality. Adrenals/Urinary Tract: Adrenal  glands are normal bilaterally. Bilateral percutaneous nephrostomy catheters are in place. The collecting systems are decompressed. There is no stone or mass lesion in either kidney. Ureters are within normal limits. A Foley catheter is present in the urinary bladder. Stomach/Bowel: Stomach is within normal limits. Duodenum is unremarkable. Dilated loops of small bowel extend into the left upper quadrant. There are multiple small ventral hernias. The transition point appears to be just below the umbilicus. The obstruction is most likely due to adhesions. Although there is a small ventral hernia, no definite strangulated bowel is evident. A loops of more distal decompressed bowel extends into a more superior left ventral hernia. This is chronic. The ostomy is decompressed. Residual colon is unremarkable. Vascular/Lymphatic: Presacral soft tissue is stable. Retroperitoneal dissection is noted. No new adenopathy is present. Reproductive: Postsurgical changes are noted. Other: No free fluid is present.  There is no free air. Musculoskeletal: Vertebral body heights and alignment are maintained. Slight degenerative anterolisthesis at L3-4 is again noted. There straightening of the normal lumbar lordosis. Bony pelvis is within normal limits. The hips are located and normal. IMPRESSION: 1. Small bowel obstruction. Transition is near the umbilicus, likely related to adhesions. No definite strangulated bowel is present. 2. Decompressed small bowel extends into a separate ventral hernia more superiorly and to the left. 3. Ostomy is decompressed. 4. Bilateral percutaneous nephrostomy drains in place. Collecting systems are decompressed. 5. Foley catheter in place. 6. Stable postsurgical changes of the pelvis. These results were called by telephone at the time of interpretation on 01/17/2019 at 4:30 am to [facility] , who verbally acknowledged these results. Electronically Signed   By: San Morelle M.D.   On:  01/17/2019 04:33    Pending Labs Unresulted Labs (From admission, onward)    Start     Ordered   01/18/19 0500  HIV antibody (Routine Testing)  Tomorrow morning,   R     01/17/19 0509   01/17/19 0441  Novel Coronavirus,NAA,(SEND-OUT TO REF LAB - TAT 24-48 hrs); Hosp Order  (Asymptomatic Patients Labs)  Once,   STAT    Question:  Rule Out  Answer:  Yes   01/17/19 0441          Vitals/Pain Today's Vitals   01/17/19 0328 01/17/19 0409 01/17/19 0410 01/17/19 0500  BP: (!) 175/72 (!) 139/56  (!) 157/51  Pulse: 67 72  71  Resp: _0 Temp:      TempSrc:      SpO2: 93% 99%  100%  Weight:      Height:      PainSc:   5      Isolation Precautions No active isolations  Medications Medications  sodium chloride 0.9 % bolus 1,000 mL (has no administration in time range)  fentaNYL (DURAGESIC) 50 MCG/HR 1 patch (has no administration in time range)  acetaminophen (TYLENOL) tablet 650 mg (has no administration in time range)    Or  acetaminophen (TYLENOL) suppository 650 mg (has no  administration in time range)  ondansetron (ZOFRAN) tablet 4 mg (has no administration in time range)    Or  ondansetron (ZOFRAN) injection 4 mg (has no administration in time range)  insulin aspart (novoLOG) injection 0-9 Units (has no administration in time range)  0.9 %  sodium chloride infusion (has no administration in time range)  levothyroxine (SYNTHROID, LEVOTHROID) injection 75 mcg (has no administration in time range)  morphine 2 MG/ML injection 1 mg (has no administration in time range)  HYDROmorphone (DILAUDID) injection 1 mg (1 mg Intravenous Given 01/17/19 0345)  metoCLOPramide (REGLAN) injection 10 mg (10 mg Intravenous Given 01/17/19 0344)  sodium chloride 0.9 % bolus 1,000 mL (0 mLs Intravenous Stopped 01/17/19 0509)  iohexol (OMNIPAQUE) 300 MG/ML solution 80 mL (80 mLs Intravenous Contrast Given 01/17/19 0356)  HYDROmorphone (DILAUDID) injection 1 mg (1 mg Intravenous Given 01/17/19  0440)    Mobility Walks - unsteady gait - 1 person assist recommended Moderate fall risk   Focused Assessments GI   R Recommendations: See Admitting Provider Note  Report given to:   Additional Notes:

## 2019-01-17 NOTE — Telephone Encounter (Signed)
She thought this morning was her last dose.

## 2019-01-17 NOTE — Consult Note (Signed)
Surgical Consultation Requesting provider: Dr. 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 She has a very complex surgical history secondary to recurrent endometrial/ ovarian cancer (ex lap, posterior supralevator pelvic exenteration, end colostomy, bladder repair, ureteral stenting with complete resection and negative margins on 11/09/14. S/p adjuvant radiation completed 03/10/15; per Dr. Serita Grit note a couple weeks ago has been disease free since this time. Postoperative and post-radiation right hydroureter and distal ureteral obstruction with ureteral stent in situ, repair complicated by vesico-colonic fistula formation, now has bilateral perc nephrostomy tubes and RUQ ileostomy). She is also s/p bilateral mastectomy and reconstruction. She has a chronic indwelling foley. Her ileostomy is high output and she receives IV fluids at home three times a week. 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 She was planning surgery at [facility] in September to remove bladder and create conduit and rearrange ostomy. Workup in ER including CT scan demonstrate SBO likely adhesive vs radiation fibrosis. There are some ventral hernias these do not seem to be the point of obstruction.    Allergies  Allergen Reactions  . Penicillins Swelling    Facial swelling/childhood allergy Has patient had a PCN reaction causing immediate rash, facial/tongue/throat swelling, SOB or lightheadedness with hypotension: Yes Has patient had a PCN reaction causing severe rash involving mucus membranes or skin necrosis: Yes Has patient had a PCN reaction that required hospitalization yes Has patient had a PCN reaction occurring within the last 10 years: No If all of the above answers are "NO", then may proceed with Cephalosporin use.   . Cefaclor Rash    Ceclor  . Erythromycin Other (See Comments)    Gastritis, abd cramps  . Tape Rash   blisters  . Trimethoprim Rash  . Ultram [Tramadol] Hives  . Cephalosporins Rash  . Fluconazole Rash  . Oxycodone Other (See Comments)    " I just feel weird"  . Pectin Rash    Pectin ring for stoma  . Septra [Sulfamethoxazole-Trimethoprim] Rash  . Sulfa Antibiotics Rash    Past Medical History:  Diagnosis Date  . Adrenal adenoma, left 02/08/2016   CT: stable benign  . Anemia in neoplastic disease   . Back pain   . Benign essential HTN   . 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 Cancer of corpus uteri, except isthmus [facility])  oncologist-- dr Denman [name] and dr Alvy Bimler    10-15-2004  dx endometroid endometrial and ovarian cancer s/p  chemotheapy and surgery(TAH w/ BSO) :  recurrent 11-19-2014 post pelvic surgery and radiation 01-29-2015 to 03-10-2015  . Chronic idiopathic neutropenia (HCC)    presumed related to chemotherapy March 2006--- followed by dr Alvy Bimler (treatment w/ G-CSF injections  . Chronic nausea   . Chronic pain    perineal/ anal  area from bladder pad irritates skin , right flank pain  . CKD stage G2/A3, GFR 60-89 and albumin creatinine ratio >300 mg/g    nephrologist-  dr Madelon Lips  . Colovesical fistula   . Diabetic retinopathy, background ([place])   . Difficult intravenous access    small veins--- hx PICC lines  . DM type 2 (diabetes mellitus, type 2) ([place])    monitored by dr Legrand Como altheimer  . Dysuria   .  Environmental and seasonal allergies   . Fatty liver 02/08/2016   CT  . Generalized muscle weakness   . GERD (gastroesophageal reflux disease)   . Hiatal hernia   . History of abdominal abscess 04/16/2017   post surgery 04-01-2017  --- resolved 10/ 2018  . History of gastric polyp    2014  duodenum  . History of ileus 04/16/2017   resolved w/ no surgical intervention   . History of radiation therapy    01-29-2015 to 03-10-2015  pelvis 50.4Gy  . Hypothyroidism    monitored by dr Legrand Como altheimer  . IBS (irritable bowel syndrome)   . Ileostomy in place [facility]) 04/01/2017   created at same time colostomy takedown.  . Joint pain   . Leg edema   . Lower urinary tract symptoms (LUTS)    urge urinary  incontinence  . Mixed dyslipidemia   . Multiple thyroid nodules    Managed by Dr. Harlow Asa  . Nephrostomy status ([place])   . Palpitations   . Pelvic abscess in female 04/16/2017  . PONV (postoperative nausea and vomiting)    "scopolamine patch works for me"  . Radiation-induced dermatitis    contact dermatitis , radiation completed, rash only on ankles now.  . Seasonal allergies   . Ureteral stricture, right UROLOGIT-  DR [facility]   CHRONIC--  TREATMENT URETERAL STENT  . 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 Vitamin D deficiency   . Wears glasses     Past Surgical History:  Procedure Laterality Date  . APPENDECTOMY    . biopsy thyroid nodules    . BREAST RECONSTRUCTION WITH PLACEMENT OF TISSUE EXPANDER AND FLEX HD (ACELLULAR HYDRATED DERMIS) Bilateral 12/11/2015   Procedure: BILATERAL BREAST RECONSTRUCTION WITH PLACEMENT OF TISSUE EXPANDERS;  Surgeon: Irene Limbo, MD;  Location: [place];  Service: Plastics;  Laterality: Bilateral;  . COLONOSCOPY WITH PROPOFOL N/A 08/21/2013   Procedure: COLONOSCOPY WITH PROPOFOL;  Surgeon: Cleotis Nipper, MD;  [facility];  Service: Endoscopy;  Laterality: N/A;  . COLOSTOMY TAKEDOWN N/A 12/04/2014   Procedure: LAPROSCOPIC LYSIS OF ADHESIONS, SPLENIC MOBILIZATION, RELOCATION OF COLOSTOMY, DEBRIDEMENT INITIAL COLOSTOMY SITE;  Surgeon: Michael [name], MD;  [facility];  Service: General;  Laterality: N/A;  . CYSTOGRAM N/A 06/01/2017   Procedure: CYSTOGRAM;  Surgeon: Alexis Frock, MD;  [facility];  Service: Urology;  Laterality: N/A;  . CYSTOSCOPY W/ RETROGRADES Right 11/21/2015   Procedure:  CYSTOSCOPY WITH RETROGRADE PYELOGRAM;  Surgeon: Alexis Frock, MD;  [facility];  Service: Urology;  Laterality: Right;  . CYSTOSCOPY W/ URETERAL STENT PLACEMENT Right 11/21/2015   Procedure: CYSTOSCOPY WITH STENT REPLACEMENT;  Surgeon: Alexis Frock, MD;  [facility];  Service: Urology;  Laterality: Right;  . CYSTOSCOPY W/ URETERAL STENT PLACEMENT Right 03/10/2016   Procedure: CYSTOSCOPY WITH STENT REPLACEMENT;  Surgeon: Alexis Frock, MD;  [facility];  Service: Urology;  Laterality: Right;  . CYSTOSCOPY W/ URETERAL STENT PLACEMENT Right 06/30/2016   Procedure: CYSTOSCOPY WITH RETROGRADE PYELOGRAM/URETERAL STENT EXCHANGE;  Surgeon: Alexis Frock, MD;  Location: [facility];  Service: Urology;  Laterality: Right;  . CYSTOSCOPY W/ URETERAL STENT PLACEMENT N/A 06/01/2017   Procedure: CYSTOSCOPY WITH EXAM UNDER ANESTHESIA;  Surgeon: Alexis Frock, MD;  [facility];  Service: Urology;  Laterality: N/A;  . CYSTOSCOPY W/ URETERAL STENT PLACEMENT Right 08/17/2017   Procedure: CYSTOSCOPY WITH RETROGRADE PYELOGRAM/URETERAL STENT REMOVAL;  Surgeon: Alexis Frock, MD;  Location: [facility];  Service: Urology;  Laterality: Right;  . CYSTOSCOPY WITH RETROGRADE PYELOGRAM, URETEROSCOPY AND STENT PLACEMENT Right 03/20/2015   Procedure: CYSTOSCOPY WITH RETROGRADE PYELOGRAM, URETEROSCOPY WITH BALLOON DILATION AND STENT PLACEMENT ON RIGHT;  Surgeon: Alexis Frock, MD;  Location: [facility];  Service: Urology;  Laterality: Right;  . CYSTOSCOPY WITH RETROGRADE PYELOGRAM, URETEROSCOPY AND STENT PLACEMENT Right 05/02/2015   Procedure: CYSTOSCOPY WITH RIGHT RETROGRADE PYELOGRAM,  DIAGNOSTIC URETEROSCOPY AND STENT PULL ;  Surgeon: Alexis Frock, MD;  Location: [facility];  Service: Urology;  Laterality: Right;  . CYSTOSCOPY WITH RETROGRADE PYELOGRAM, URETEROSCOPY AND STENT PLACEMENT Right 09/05/2015   Procedure:  CYSTOSCOPY WITH RETROGRADE PYELOGRAM,  AND STENT PLACEMENT;  Surgeon: Alexis Frock, MD;  [facility];  Service: Urology;  Laterality: Right;  . CYSTOSCOPY WITH RETROGRADE PYELOGRAM, URETEROSCOPY AND STENT PLACEMENT Right 04/01/2017   Procedure: CYSTOSCOPY WITH RETROGRADE PYELOGRAM, URETEROSCOPY AND STENT PLACEMENT;  Surgeon: Alexis Frock, MD;  [facility];  Service: Urology;  Laterality: Right;  . CYSTOSCOPY WITH STENT PLACEMENT Right 10/27/2016   Procedure: CYSTOSCOPY WITH STENT CHANGE and right retrograde pyelogram;  Surgeon: Alexis Frock, MD;  Location: [facility];  Service: Urology;  Laterality: Right;  . EUS N/A 10/02/2014   Procedure: LOWER ENDOSCOPIC ULTRASOUND (EUS);  Surgeon: Arta Silence, MD;  Location: [facility];  Service: Endoscopy;  Laterality: N/A;  . EXCISION SOFT TISSUE MASS RIGHT FOREMAN  12-08-2006  . EYE SURGERY  as child   pytosis of eyelids repair  . INCISION AND DRAINAGE OF WOUND Bilateral 12/26/2015   Procedure: DEBRIDEMENT OF BILATERAL MASTECTOMY FLAPS;  Surgeon: Irene Limbo, MD;  Location: [place];  Service: Plastics;  Laterality: Bilateral;  . IR CV LINE INJECTION  05/31/2017  . IR FLUORO GUIDE CV LINE LEFT  05/31/2017  . IR FLUORO GUIDE CV LINE RIGHT  04/06/2017  . IR FLUORO GUIDE CV MIDLINE PICC RIGHT  05/30/2017  . IR NEPHROSTOGRAM LEFT INITIAL PLACEMENT  09/02/2017  . IR NEPHROSTOGRAM LEFT THRU EXISTING ACCESS  11/29/2017  . IR NEPHROSTOGRAM RIGHT INITIAL PLACEMENT  09/02/2017  . IR NEPHROSTOGRAM RIGHT THRU EXISTING ACCESS  09/13/2017  . IR NEPHROSTOGRAM RIGHT THRU EXISTING ACCESS  11/29/2017  . IR NEPHROSTOMY EXCHANGE LEFT  11/28/2017  . IR NEPHROSTOMY EXCHANGE LEFT  01/05/2018  . IR NEPHROSTOMY EXCHANGE LEFT  02/16/2018  . IR NEPHROSTOMY EXCHANGE LEFT  03/30/2018  . IR NEPHROSTOMY EXCHANGE LEFT  05/12/2018  . IR NEPHROSTOMY EXCHANGE LEFT  06/21/2018  . IR NEPHROSTOMY EXCHANGE LEFT  08/04/2018  . IR NEPHROSTOMY  EXCHANGE LEFT  09/18/2018  . IR NEPHROSTOMY EXCHANGE LEFT  10/09/2018  . IR NEPHROSTOMY EXCHANGE LEFT  10/27/2018  . IR NEPHROSTOMY EXCHANGE LEFT  11/21/2018  . IR NEPHROSTOMY EXCHANGE LEFT  01/05/2019  . IR NEPHROSTOMY EXCHANGE RIGHT  10/02/2017  . IR NEPHROSTOMY EXCHANGE RIGHT  11/28/2017  . IR NEPHROSTOMY EXCHANGE RIGHT  01/05/2018  . IR NEPHROSTOMY EXCHANGE RIGHT  02/16/2018  . IR NEPHROSTOMY EXCHANGE RIGHT  03/30/2018  . IR NEPHROSTOMY EXCHANGE RIGHT  05/12/2018  . IR NEPHROSTOMY EXCHANGE RIGHT  06/21/2018  . IR NEPHROSTOMY EXCHANGE RIGHT  08/04/2018  . IR NEPHROSTOMY EXCHANGE RIGHT  09/18/2018  . IR NEPHROSTOMY EXCHANGE RIGHT  10/27/2018  . IR NEPHROSTOMY EXCHANGE RIGHT  11/21/2018  . IR NEPHROSTOMY EXCHANGE RIGHT  01/05/2019  . IR NEPHROSTOMY PLACEMENT LEFT  10/02/2017  . IR RADIOLOGIST EVAL & MGMT  05/03/2017  . IR US GUIDE VASC ACCESS LEFT  05/31/2017  . IR US GUIDE  VASC ACCESS RIGHT  04/06/2017  . IR US GUIDE VASC ACCESS RIGHT  05/30/2017  . LAPAROSCOPIC CHOLECYSTECTOMY  1990  . LIPOSUCTION WITH LIPOFILLING Bilateral 04/16/2016   Procedure: LIPOSUCTION WITH LIPOFILLING TO BILATERAL CHEST;  Surgeon: Irene Limbo, MD;  Location: [place];  Service: Plastics;  Laterality: Bilateral;  . MASTECTOMY W/ SENTINEL NODE BIOPSY Bilateral 12/11/2015   Procedure: RIGHT PROPHYLACTIC MASTECTOMY, LEFT TOTAL MASTECTOMY WITH LEFT AXILLARY SENTINEL LYMPH NODE BIOPSY;  Surgeon: Stark Klein, MD;  Location: [place];  Service: General;  Laterality: Bilateral;  . OSTOMY N/A 11/19/2014   Procedure: OSTOMY;  Surgeon: Michael [name], MD;  [facility];  Service: General;  Laterality: N/A;  . PROCTOSCOPY N/A 04/01/2017   Procedure: RIDGE PROCTOSCOPY;  Surgeon: Michael [name], MD;  [facility];  Service: General;  Laterality: N/A;  . REMOVAL OF BILATERAL TISSUE EXPANDERS WITH PLACEMENT OF BILATERAL BREAST IMPLANTS Bilateral 04/16/2016   Procedure: REMOVAL OF BILATERAL TISSUE EXPANDERS WITH PLACEMENT OF  BILATERAL BREAST IMPLANTS;  Surgeon: Irene Limbo, MD;  Location: [place];  Service: Plastics;  Laterality: Bilateral;  . ROBOTIC ASSISTED LAP VAGINAL HYSTERECTOMY N/A 11/19/2014   Procedure: ROBOTIC LYSIS OF ADHESIONS, CONVERTED TO LAPAROTOMY RADICAL UPPER VAGINECTOMY,LOW ANTERIOR BOWEL RESECTION, COLOSTOMY, BILATERAL URETERAL STENT PLACEMENT AND CYSTONOMY CLOSURE;  Surgeon: Everitt Amber, MD;  [facility];  Service: Gynecology;  Laterality: N/A;  . TISSUE EXPANDER FILLING Bilateral 12/26/2015   Procedure: EXPANSION OF BILATERAL CHEST TISSUE EXPANDERS (60 mL- Right; 75 mL- Left);  Surgeon: Irene Limbo, MD;  Location: [place];  Service: Plastics;  Laterality: Bilateral;  . TONSILLECTOMY    . TOTAL ABDOMINAL HYSTERECTOMY  March 2006   [facility]   and Bilateral Salpingoophorectomy/  staging for Ovarian cancer/  an  . XI ROBOTIC ASSISTED LOWER ANTERIOR RESECTION N/A 04/01/2017   Procedure: XI ROBOTIC VS LAPAROSCOPIC COLOSTOMY TAKEDOWN WITH LYSIS OF ADHESIONS.;  Surgeon: Michael [name], MD;  [facility];  Service: General;  Laterality: N/A;  ERAS PATHWAY    Family History  Problem Relation Age of Onset  . Cancer Mother 49       stomach ca  . Hypertension Mother   . Cancer Father 84       prostate ca  . Diabetes Father   . Heart disease Father        CABG  . Hypertension Father   . Hyperlipidemia Father   . Obesity Father   . Breast cancer Maternal Aunt        dx in her 66s  . Lymphoma Paternal Aunt   . Brain cancer Paternal Grandfather   . Ovarian cancer Other   . Diabetes Sister   . Hypertension Brother y-10  . Heart disease Brother        CABG  . Diabetes Brother     Social History   Socioeconomic History  . Marital status: Married    Spouse name: Zalea Pete  . Number of children: 1  . Years of education: Not on file  . Highest education level: Not on file  Occupational History  . 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 Financial resource strain: Not on file  . Food insecurity    Worry: Not on file    Inability: Not on file  . Transportation needs    Medical: Not on file    Non-medical: Not on file  Tobacco Use  . Smoking  status: Never Smoker  . Smokeless tobacco: Never Used  Substance and Sexual Activity  . Alcohol use: Not Currently  . Drug use: No  . Sexual activity: Not on file  Lifestyle  . Physical activity    Days per week: Not on file    Minutes per session: Not on file  . Stress: Not on file  Relationships  . Social Herbalist on phone: Not on file    Gets together: Not on file    Attends religious service: Not on file    Active member of club or organization: Not on file    Attends meetings of clubs or organizations: Not on file    Relationship status: Not on file  Other Topics Concern  . Not on file  Social History Narrative   Exercise-- has not gotten back into it since cancer came back    No current facility-administered medications on file prior to encounter.    Current Outpatient Medications on File Prior to Encounter  Medication Sig Dispense Refill  . acetaminophen (TYLENOL) 325 MG tablet Take 2 tablets (650 mg total) by mouth every 6 (six) hours as needed. 30 tablet 1  . anastrozole (ARIMIDEX) 1 MG tablet TAKE 1 TABLET DAILY (Patient taking differently: Take 1 mg by mouth daily. ) 90 tablet 3  . Biotin 5 MG TABS Take 5 mg by mouth daily.     [name] Kitchen buPROPion (WELLBUTRIN SR) 200 MG 12 hr tablet Take 1 tablet (200 mg total) by mouth daily. 30 tablet 0  . Cholecalciferol (VITAMIN D3) 10000 UNITS capsule Take 10,000 Units by mouth every Sunday.     . clobetasol (OLUX) 0.05 % topical foam Apply topically 2 (two) times daily.    . diphenhydrAMINE (BENADRYL) 25 mg capsule Take 1 capsule (25 mg total) by mouth every 8 (eight) hours as needed for itching, allergies or sleep. 30 capsule 0  . diphenoxylate-atropine (LOMOTIL) 2.5-0.025 MG  tablet Take 1 tablet by mouth 4 (four) times daily as needed for diarrhea or loose stools. TO PREVENT LOOSE BOWEL MOVEMENTS 30 tablet 0  . fentaNYL (DURAGESIC) 50 MCG/HR Place 1 patch onto the skin every 3 (three) days. 10 patch 0  . ferrous sulfate 325 (65 FE) MG tablet Take 1 tablet (325 mg total) by mouth at bedtime. 30 tablet 3  . filgrastim-sndz (ZARXIO) 480 MCG/0.8ML SOSY injection Inject 0.8 mLs (480 mcg total) into the skin as directed. Inject 480 mcg every 5 days 0.8 mL 99  . HYDROcodone-acetaminophen (NORCO/VICODIN) 5-325 MG tablet Take 2 tablets by mouth every 6 (six) hours as needed for moderate pain. 30 tablet 0  . JANUVIA 50 MG tablet Take 50 mg by mouth daily.     [name] Kitchen levothyroxine (SYNTHROID, LEVOTHROID) 150 MCG tablet Take 1 tablet (150 mcg total) by mouth daily before breakfast. 30 tablet 1  . loratadine (CLARITIN) 10 MG tablet Take 10 mg by mouth daily.     . nitrofurantoin, macrocrystal-monohydrate, (MACROBID) 100 MG capsule Take 100 mg by mouth at bedtime.    [name] Kitchen omega-3 acid ethyl esters (LOVAZA) 1 G capsule Take 1 g by mouth 2 (two) times daily.     Vladimir Faster Glycol-Propyl Glycol (SYSTANE OP) Place 1 drop into both eyes daily as needed (dry eyes).     . pregabalin (LYRICA) 50 MG capsule TAKE 1 CAPSULE(50 MG) BY MOUTH TWICE DAILY (Patient taking differently: Take 50 mg by mouth 2 (two) times daily. ) 180 capsule 3  . Prenatal  Vit-Fe Fumarate-FA (PRENATAL VITAMIN PO) Take 1 capsule by mouth daily. Takes prenatal because there are no dyes in it    . rosuvastatin (CRESTOR) 10 MG tablet Take 10 mg by mouth every evening.     . Saccharomyces boulardii (FLORASTOR PO) Take 1 capsule by mouth daily.      Review of Systems: a complete, 10pt review of systems was completed with pertinent positives and negatives as documented in the HPI  Physical Exam: Vitals:   01/17/19 0550 01/17/19 0608  BP:  (!) 143/58  Pulse:  73  Resp:  16  Temp: 97.8 F (36.6 C) 97.7 F (36.5 C)  SpO2:   98%   Gen: A&Ox3, no distress  Head: normocephalic, atraumatic Eyes: extraocular motions intact, anicteric.  Neck: supple without mass or thyromegaly Chest: unlabored respirations, symmetrical air entry, clear bilaterally   Cardiovascular: RRR with palpable distal pulses, no pedal edema Abdomen: soft, mildly distended, tender with voluntary guarding across mid-abdomen. Multiple well healed scars. No overtly palpable hernia, No mass or organomegaly. RUQ ileostomy. Bilateral perc neph tubes Extremities: warm, without edema, no deformities  Neuro: grossly intact Psych: appropriate mood and affect, normal insight  Skin: warm and dry   CBC Latest Ref Rng & Units 01/17/2019 01/05/2019 11/29/2018  WBC 4.0 - 10.5 K/uL 13.7(H) 4.6 4.0  Hemoglobin 12.0 - 15.0 g/dL 11.5(L) 11.1(L) 10.8(A)  Hematocrit 36.0 - 46.0 % 37.6 36.6 35(A)  Platelets 150 - 400 K/uL 279 259 273    CMP Latest Ref Rng & Units 01/17/2019 01/05/2019 11/29/2018  Glucose 70 - 99 mg/dL 164(H) 103(H) -  BUN 8 - 23 mg/dL 24(H) 35(H) 22(A)  Creatinine 0.44 - 1.00 mg/dL 1.52(H) 1.60(H) 1.3(A)  Sodium 135 - 145 mmol/L 141 139 142  Potassium 3.5 - 5.1 mmol/L 4.0 4.1 4.4  Chloride 98 - 111 mmol/L 101 112(H) -  CO2 22 - 32 mmol/L 28 21(L) -  Calcium 8.9 - 10.3 mg/dL 9.4 9.2 -  Total Protein 6.5 - 8.1 g/dL 7.4 - -  Total Bilirubin 0.3 - 1.2 mg/dL 0.6 - -  Alkaline Phos 38 - 126 U/L 305(H) - 377(A)  AST 15 - 41 U/L 24 - 46(A)  ALT 0 - 44 U/L 53(H) - 70(A)    Lab Results  Component Value Date   INR 0.9 01/05/2019   INR 1.02 11/29/2017   INR 1.34 10/02/2017    Imaging: Ct Abdomen Pelvis W Contrast  Result Date: 01/17/2019 CLINICAL DATA:  Abdominal pain, unspecified. EXAM: CT ABDOMEN AND PELVIS WITH CONTRAST TECHNIQUE: Multidetector CT imaging of the abdomen and pelvis was performed using the standard protocol following bolus administration of intravenous contrast. CONTRAST:  37m OMNIPAQUE IOHEXOL 300 MG/ML  SOLN COMPARISON:  CT  abdomen and pelvis 01/02/2019 FINDINGS: Lower chest: Lung bases are clear without focal nodule, mass, or airspace disease. The heart size is normal. No significant pleural or pericardial effusion is present. Hepatobiliary: Mild diffuse fatty infiltration of the liver is present. No discrete lesions are evident. There is mild intra and extrahepatic biliary dilation following cholecystectomy. The biliary ducts are slightly more prominent than on the prior study. No obstructing lesion is evident. Pancreas: Unremarkable. No pancreatic ductal dilatation or surrounding inflammatory changes. Spleen: Normal in size without focal abnormality. Adrenals/Urinary Tract: Adrenal glands are normal bilaterally. Bilateral percutaneous nephrostomy catheters are in place. The collecting systems are decompressed. There is no stone or mass lesion in either kidney. Ureters are within normal limits. A Foley catheter is present in the  urinary bladder. Stomach/Bowel: Stomach is within normal limits. Duodenum is unremarkable. Dilated loops of small bowel extend into the left upper quadrant. There are multiple small ventral hernias. The transition point appears to be just below the umbilicus. The obstruction is most likely due to adhesions. Although there is a small ventral hernia, no definite strangulated bowel is evident. A loops of more distal decompressed bowel extends into a more superior left ventral hernia. This is chronic. The ostomy is decompressed. Residual colon is unremarkable. Vascular/Lymphatic: Presacral soft tissue is stable. Retroperitoneal dissection is noted. No new adenopathy is present. Reproductive: Postsurgical changes are noted. Other: No free fluid is present.  There is no free air. Musculoskeletal: Vertebral body heights and alignment are maintained. Slight degenerative anterolisthesis at L3-4 is again noted. There straightening of the normal lumbar lordosis. Bony pelvis is within normal limits. The hips are located  and normal. IMPRESSION: 1. Small bowel obstruction. Transition is near the umbilicus, likely related to adhesions. No definite strangulated bowel is present. 2. Decompressed small bowel extends into a separate ventral hernia more superiorly and to the left. 3. Ostomy is decompressed. 4. Bilateral percutaneous nephrostomy drains in place. Collecting systems are decompressed. 5. Foley catheter in place. 6. Stable postsurgical changes of the pelvis. These results were called by telephone at the time of interpretation on 01/17/2019 at 4:30 am to [facility] , who verbally acknowledged these results. Electronically Signed   By: San Morelle M.D.   On: 01/17/2019 04:33   Dg Abd Portable 1 View  Result Date: 01/17/2019 CLINICAL DATA:  NG tube placement. EXAM: PORTABLE ABDOMEN - 1 VIEW COMPARISON:  CT of the abdomen and pelvis 01/17/2019 FINDINGS: Right-sided PICC line terminates in the mid SVC. Heart size is exaggerated by low lung volumes.  Lungs are clear. Side port of the NG tube is in the fundus of the stomach. Dilated loops of small bowel are again noted in the left upper quadrant. Bilateral percutaneous nephrostomy tubes are noted. IMPRESSION: 1. Side port of the NG tube is in the fundus the stomach. 2. Persistent dilated small bowel in the left upper quadrant. Electronically Signed   By: San Morelle M.D.   On: 01/17/2019 05:54     A/P: 63yo woman with complex surgical history, hx of radiation and SBO. NG in place, will attempt SBO protocol.    Romana Juniper, MD [facility] [name] Pager [phone]

## 2019-01-17 NOTE — ED Triage Notes (Signed)
Pt. From home c/o abdominal pain and NV. Abdominal pain on LLQ starting 2 nights ago. Pain 5 on scale 0-10. Pt. States eating causes vomiting making pain worsen. Pt. States movement worsens pain. Vomiting x15 last 24 hours bile color.   Pt. ileostomy since Feb. 2019.

## 2019-01-17 NOTE — Progress Notes (Signed)
Attempted NG tube insertion x2, patient cannot tolerate.

## 2019-01-17 NOTE — ED Notes (Signed)
6N unable to take report to call back 5-10 minutes

## 2019-01-17 NOTE — Telephone Encounter (Signed)
Since she is better I recommend stopping meropenem today to minimize risk of adverse side effects.

## 2019-01-18 ENCOUNTER — Inpatient Hospital Stay (HOSPITAL_COMMUNITY): Payer: BC Managed Care – PPO

## 2019-01-18 ENCOUNTER — Ambulatory Visit (INDEPENDENT_AMBULATORY_CARE_PROVIDER_SITE_OTHER): Payer: BLUE CROSS/BLUE SHIELD | Admitting: Family Medicine

## 2019-01-18 DIAGNOSIS — E785 Hyperlipidemia, unspecified: Secondary | ICD-10-CM | POA: Diagnosis not present

## 2019-01-18 DIAGNOSIS — E860 Dehydration: Secondary | ICD-10-CM | POA: Diagnosis not present

## 2019-01-18 DIAGNOSIS — L89309 Pressure ulcer of unspecified buttock, unspecified stage: Secondary | ICD-10-CM | POA: Diagnosis not present

## 2019-01-18 LAB — GLUCOSE, CAPILLARY
Glucose-Capillary: 93 mg/dL (ref 70–99)
Glucose-Capillary: 104 mg/dL — ABNORMAL HIGH (ref 70–99)
Glucose-Capillary: 77 mg/dL (ref 70–99)
Glucose-Capillary: 74 mg/dL (ref 70–99)
Glucose-Capillary: 65 mg/dL — ABNORMAL LOW (ref 70–99)
Glucose-Capillary: 152 mg/dL — ABNORMAL HIGH (ref 70–99)
Glucose-Capillary: 107 mg/dL — ABNORMAL HIGH (ref 70–99)

## 2019-01-18 LAB — HIV ANTIBODY (ROUTINE TESTING W REFLEX): HIV Screen 4th Generation wRfx: NONREACTIVE

## 2019-01-18 LAB — NOVEL CORONAVIRUS, NAA (HOSP ORDER, SEND-OUT TO REF LAB; TAT 18-24 HRS): SARS-CoV-2, NAA: NOT DETECTED

## 2019-01-18 MED ORDER — DEXTROSE 50 % IV SOLN
50.0000 mL | Freq: Once | INTRAVENOUS | Status: AC
  Administered 2019-01-18: 50 mL via INTRAVENOUS

## 2019-01-18 MED ORDER — DEXTROSE 50 % IV SOLN
INTRAVENOUS | Status: AC
  Filled 2019-01-18: qty 50

## 2019-01-18 MED ORDER — LACTATED RINGERS IV SOLN
INTRAVENOUS | Status: DC
  Administered 2019-01-18 – 2019-01-19 (×3): via INTRAVENOUS

## 2019-01-18 NOTE — Progress Notes (Signed)
PROGRESS NOTE    [name]  FXT:[id number] DOB: [birth year] DOA: 01/17/2019 PCP: Ann Held, DO  Outpatient Specialists:    Brief Narrative:  As per H and P "[name] is a 63 y.o. female with history of endometrial and ovarian cancer status post surgery complicated with infection and presently patient has bilateral nephrostomy ileostomy and Foley catheter was recently treated for UTI with IV antibiotics until yesterday presents to the ER with complaint of persistent abdominal pain diffuse in nature with multiple episodes of nausea vomiting.  Pain has been going on for last 2 days.  Denies chest pain or shortness of breath fever chills or productive cough.  ED Course: In the ER patient had a CT abdomen pelvis which shows small bowel obstruction with transition point.  On-call general surgeon was consulted patient has NG tube placed and admitted for further management of small bowel obstruction.  Labs revealed elevated creatinine at baseline of 1.5 mild anemia".  01/18/2019: SBO persists.  Surgical input is appreciated.  NG tube is still draining a lot of secretions.   Assessment & Plan:   Principal Problem:   SBO (small bowel obstruction) (HCC) Active Problems:   Primary hypothyroidism   Benign essential HTN   Type II diabetes mellitus with renal manifestations (HCC)    Small bowel obstruction: Continue NG tube to low suction Surgical input is appreciated. Supportive care. Ensure adequate hydration. Optimize electrolytes.   Hypothyroidism: Continue Synthroid.    Diabetes mellitus type 2: Accu-Cheks. Sliding scale insulin coverage.  Chronic kidney disease stage III Creatinine appears to be at baseline follow metabolic panel. Continue to monitor closely.  Anemia: Stable. Continue to monitor.  Hypertension: Continue to optimize.   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   DVT prophylaxis: SCDs in anticipation of possible procedure. Code Status: Full code. Family Communication: Discussed with patient. Disposition Plan: Home. Consults called: General surgery.   Procedures:   NG-tube placement  Antimicrobials:   None   Subjective: No new complaints SBO persists  Objective: Vitals:   01/17/19 1452 01/17/19 2109 01/18/19 0424 01/18/19 1357  BP: (!) 135/56 (!) 111/53 (!) 115/57 (!) 127/55  Pulse: 66 66 62 61  Resp: 18 16 16 18   Temp: 99.6 F (37.6 C) 98.5 F (36.9 C) 98.4 F (36.9 C) 98 F (36.7 C)  TempSrc: Oral Oral Oral Oral  SpO2: 100% 99% 98% 100%  Weight:      Height:        Intake/Output Summary (Last 24 hours) at 01/18/2019 1850 Last data filed at 01/18/2019 1700 Gross per 24 hour  Intake 1092.87 ml  Output 1100 ml  Net -7.13 ml   Filed Weights   01/17/19 0315 01/17/19 0608  Weight: 87.1 kg 90.7 kg    Examination:  General exam: Appears calm and comfortable.  Bilateral nephrostomy tube is in place.  Ileostomy noted. Respiratory system: Clear to auscultation.  Cardiovascular system: S1 & S2  Gastrointestinal system: Abdomen is nondistended, soft and nontender.  Central nervous system: Alert and oriented. No focal neurological deficits. Extremities: Symmetric 5 x 5 power.  Data Reviewed: I have personally reviewed following labs and imaging studies  CBC: Recent Labs  Lab 01/17/19 0351  WBC 13.7*  NEUTROABS 11.7*  HGB 11.5*  HCT 37.6  MCV 96.2  PLT 279   Basic  Metabolic Panel: Recent Labs  Lab 01/17/19 0351  NA 141  K 4.0  CL 101  CO2 28  GLUCOSE 164*  BUN 24*  CREATININE 1.52*  CALCIUM 9.4   GFR: Estimated Creatinine Clearance: 39.6 mL/min (A) (by C-G formula based on SCr of 1.52 mg/dL (H)). Liver Function Tests: Recent Labs  Lab 01/17/19 0351  AST 24   ALT 53*  ALKPHOS 305*  BILITOT 0.6  PROT 7.4  ALBUMIN 2.9*   Recent Labs  Lab 01/17/19 0351  LIPASE 27   No results for input(s): AMMONIA in the last 168 hours. Coagulation Profile: No results for input(s): INR, PROTIME in the last 168 hours. Cardiac Enzymes: No results for input(s): CKTOTAL, CKMB, CKMBINDEX, TROPONINI in the last 168 hours. BNP (last 3 results) No results for input(s): PROBNP in the last 8760 hours. HbA1C: No results for input(s): HGBA1C in the last 72 hours. CBG: Recent Labs  Lab 01/17/19 2308 01/18/19 0426 01/18/19 0757 01/18/19 1219 01/18/19 1552  GLUCAP 98 93 104* 77 74   Lipid Profile: No results for input(s): CHOL, HDL, LDLCALC, TRIG, CHOLHDL, LDLDIRECT in the last 72 hours. Thyroid Function Tests: No results for input(s): TSH, T4TOTAL, FREET4, T3FREE, THYROIDAB in the last 72 hours. Anemia Panel: No results for input(s): VITAMINB12, FOLATE, FERRITIN, TIBC, IRON, RETICCTPCT in the last 72 hours. Urine analysis:    Component Value Date/Time   COLORURINE YELLOW 10/04/2018 1320   APPEARANCEUR HAZY (A) 10/04/2018 1320   LABSPEC 1.017 10/04/2018 1320   LABSPEC 1.015 11/17/2015 1436   PHURINE 6.0 10/04/2018 1320   GLUCOSEU NEGATIVE 10/04/2018 1320   GLUCOSEU 2,000 11/17/2015 1436   HGBUR LARGE (A) 10/04/2018 1320   BILIRUBINUR NEGATIVE 10/04/2018 1320   BILIRUBINUR neg 08/22/2018 1409   BILIRUBINUR Negative 11/17/2015 1436   KETONESUR NEGATIVE 10/04/2018 1320   PROTEINUR 100 (A) 10/04/2018 1320   UROBILINOGEN 0.2 08/22/2018 1409   UROBILINOGEN 0.2 11/17/2015 1436   NITRITE NEGATIVE 10/04/2018 1320   LEUKOCYTESUR MODERATE (A) 10/04/2018 1320   LEUKOCYTESUR Moderate 11/17/2015 1436   Sepsis Labs: @LABRCNTIP (procalcitonin:4,lacticidven:4)  ) Recent Results (from the past 240 hour(s))  Novel Coronavirus,NAA,(SEND-OUT TO REF LAB - TAT 24-48 hrs); Hosp Order     Status: None   Collection Time: 01/17/19  4:47 AM   Specimen: Nasopharyngeal  Swab; Respiratory  Result Value Ref Range Status   SARS-CoV-2, NAA NOT DETECTED NOT DETECTED Final    Comment: (NOTE) This test was developed and its performance characteristics determined by [employer]. This test has not been FDA cleared or approved. This test has been authorized by FDA under an Emergency Use Authorization (EUA). This test is only authorized for the duration of time the declaration that circumstances exist justifying the authorization of the emergency use of in vitro diagnostic tests for detection of SARS-CoV-2 virus and/or diagnosis of COVID-19 infection under section 564(b)(1) of the Act, 21 U.S.C. 283MOQ-9(U)(7), unless the authorization is terminated or revoked sooner. When diagnostic testing is negative, the possibility of a false negative result should be considered in the context of a patient's recent exposures and the presence of clinical signs and symptoms consistent with COVID-19. An individual without symptoms of COVID-19 and who is not shedding SARS-CoV-2 virus would expect to have a negative (not detected) result in this assay. Performed  At: [facility] [address] [place], [place] [id number] Rush Farmer MD SF:[id number]    [place]  Final    Comment: Performed at [facility], [place]  [address] [place], [place] [address]         Radiology Studies: Dg Abd 1 View  Result Date: 01/18/2019 CLINICAL DATA:  Small-bowel protocol. 24 hour delay after Gastrografin administration. EXAM: ABDOMEN - 1 VIEW COMPARISON:  01/17/2019 FINDINGS: Stable position of the bilateral nephrostomy tubes. Nasogastric tube in region of the stomach body. There continues to be gas-filled loops of small bowel in the mid abdomen. Oral contrast is not well demonstrated on this examination. Again noted are multiple surgical clips in the abdominopelvic region. Limited evaluation for free air on this supine image. IMPRESSION:  Persistent gas-filled loops of bowel in the central abdomen. Bowel gas is less obvious on this examination and may be related to recent Gastrografin administration. Findings remain compatible with a small bowel obstruction. Electronically Signed   By: Markus Daft M.D.   On: 01/18/2019 12:04   Dg Abd 1 View  Result Date: 01/17/2019 CLINICAL DATA:  Replaced nasogastric tube EXAM: ABDOMEN - 1 VIEW COMPARISON:  Portable exam 1045 hours compared to 0727 hours FINDINGS: Tip of NG tube projects over stomach with proximal side-port projecting just above gastroesophageal junction. Dilated loop of small bowel in the LEFT upper quadrant. BILATERAL for ostomy tubes present. Lung bases clear. IMPRESSION: Proximal side-port of nasogastric tube projects over distal esophagus, recommend advancing tube 2 cm. Electronically Signed   By: [name] M.D.   On: 01/17/2019 12:09   Ct Abdomen Pelvis W Contrast  Result Date: 01/17/2019 CLINICAL DATA:  Abdominal pain, unspecified. EXAM: CT ABDOMEN AND PELVIS WITH CONTRAST TECHNIQUE: Multidetector CT imaging of the abdomen and pelvis was performed using the standard protocol following bolus administration of intravenous contrast. CONTRAST:  100mL OMNIPAQUE IOHEXOL 300 MG/ML  SOLN COMPARISON:  CT abdomen and pelvis 01/02/2019 FINDINGS: Lower chest: Lung bases are clear without focal nodule, mass, or airspace disease. The heart size is normal. No significant pleural or pericardial effusion is present. Hepatobiliary: Mild diffuse fatty infiltration of the liver is present. No discrete lesions are evident. There is mild intra and extrahepatic biliary dilation following cholecystectomy. The biliary ducts are slightly more prominent than on the prior study. No obstructing lesion is evident. Pancreas: Unremarkable. No pancreatic ductal dilatation or surrounding inflammatory changes. Spleen: Normal in size without focal abnormality. Adrenals/Urinary Tract: Adrenal glands are normal  bilaterally. Bilateral percutaneous nephrostomy catheters are in place. The collecting systems are decompressed. There is no stone or mass lesion in either kidney. Ureters are within normal limits. A Foley catheter is present in the urinary bladder. Stomach/Bowel: Stomach is within normal limits. Duodenum is unremarkable. Dilated loops of small bowel extend into the left upper quadrant. There are multiple small ventral hernias. The transition point appears to be just below the umbilicus. The obstruction is most likely due to adhesions. Although there is a small ventral hernia, no definite strangulated bowel is evident. A loops of more distal decompressed bowel extends into a more superior left ventral hernia. This is chronic. The ostomy is decompressed. Residual colon is unremarkable. Vascular/Lymphatic: Presacral soft tissue is stable. Retroperitoneal dissection is noted. No new adenopathy is present. Reproductive: Postsurgical changes are noted. Other: No free fluid is present.  There is no free air. Musculoskeletal: Vertebral body heights and alignment are maintained. Slight degenerative anterolisthesis at L3-4 is again noted. There straightening of the normal lumbar lordosis. Bony pelvis is within normal limits. The hips are located and normal. IMPRESSION: 1. Small bowel obstruction. Transition is near the umbilicus, likely related to adhesions. No definite strangulated  bowel is present. 2. Decompressed small bowel extends into a separate ventral hernia more superiorly and to the left. 3. Ostomy is decompressed. 4. Bilateral percutaneous nephrostomy drains in place. Collecting systems are decompressed. 5. Foley catheter in place. 6. Stable postsurgical changes of the pelvis. These results were called by telephone at the time of interpretation on 01/17/2019 at 4:30 am to [facility] , who verbally acknowledged these results. Electronically Signed   By: San Morelle M.D.   On: 01/17/2019 04:33   Dg  Abd Portable 1v-small Bowel Obstruction Protocol-initial, 8 Hr Delay  Result Date: 01/17/2019 CLINICAL DATA:  Small bowel obstruction EXAM: PORTABLE ABDOMEN - 1 VIEW COMPARISON:  01/17/2019 FINDINGS: Bilateral nephrostomy catheters and NG tube remain in place, unchanged. Dilated small bowel loops noted in the abdomen compatible with small bowel obstruction. No significant change since prior study. Prior cholecystectomy. No organomegaly or visible free air. IMPRESSION: Stable small bowel obstruction pattern. Electronically Signed   By: Rolm Baptise M.D.   On: 01/17/2019 21:48   Dg Abd Portable 1v-small Bowel Protocol-position Verification  Result Date: 01/17/2019 CLINICAL DATA:  NG tube placement. EXAM: PORTABLE ABDOMEN - 1 VIEW COMPARISON:  One-view abdomen 01/17/2019 FINDINGS: Side port of the NG tube is now at the GE junction. Would advance 5 cm for more optimal positioning. Dilated loops of bowel are again noted in the left upper quadrant. The stomach is collapsed. Nephrostomy tubes are stable. IMPRESSION: 1. The NG tube scratched at the side port of the NG tube is at the GE junction. Would advance 5 cm for more optimal positioning. Electronically Signed   By: San Morelle M.D.   On: 01/17/2019 10:04   Dg Abd Portable 1 View  Result Date: 01/17/2019 CLINICAL DATA:  NG tube placement. EXAM: PORTABLE ABDOMEN - 1 VIEW COMPARISON:  CT of the abdomen and pelvis 01/17/2019 FINDINGS: Right-sided PICC line terminates in the mid SVC. Heart size is exaggerated by low lung volumes.  Lungs are clear. Side port of the NG tube is in the fundus of the stomach. Dilated loops of small bowel are again noted in the left upper quadrant. Bilateral percutaneous nephrostomy tubes are noted. IMPRESSION: 1. Side port of the NG tube is in the fundus the stomach. 2. Persistent dilated small bowel in the left upper quadrant. Electronically Signed   By: San Morelle M.D.   On: 01/17/2019 05:54         Scheduled Meds: . [START ON 01/19/2019] fentaNYL  1 patch Transdermal Q72H  . insulin aspart  0-9 Units Subcutaneous TID WC  . levothyroxine  75 mcg Intravenous QAC breakfast   Continuous Infusions: . lactated ringers 100 mL/hr at 01/18/19 1500     LOS: 1 day    Time spent: 41 Minutes    Dana Allan, MD  [facility] Pager #: [phone] 7PM-7AM contact night coverage as above

## 2019-01-18 NOTE — Progress Notes (Signed)
    CC:  SBO  Subjective: No real change, sump was full of fluid, and filter was wet.  We flushed sump, and irrigated the NG it is still working well.  She is not really overly distended.  She had some nausea when she was up to BR, but not much.  She has some feculent fluid in her ostomy this AM  Objective: Vital signs in last 24 hours: Temp:  [98.4 F (36.9 C)-99.6 F (37.6 C)] 98.4 F (36.9 C) (06/18 0424) Pulse Rate:  [62-66] 62 (06/18 0424) Resp:  [16-18] 16 (06/18 0424) BP: (111-135)/(53-57) 115/57 (06/18 0424) SpO2:  [98 %-100 %] 98 % (06/18 0424) Last BM Date: 01/18/19(Liquid from Ileostomy) NPO IV 229 recorded NG 700 recorded Urine 1000 Stool 100 Afebrile, VSS No labs this AM Glucose 90-100 range aBX yesterday 9;43PM:  Bilateral nephrostomy catheters and NG tube remain in place, unchanged. Dilated small bowel loops noted in the abdomen compatible with small bowel obstruction. No significant change since prior study. Prior cholecystectomy. No organomegaly or visible free air. Intake/Output from previous day: 06/17 0701 - 06/18 0700 In: 929.8 [I.V.:229.8; NG/GT:700] Out: 1100 [Urine:1000; Stool:100] Intake/Output this shift: No intake/output data recorded.  General appearance: alert, cooperative and no distress Resp: clear to auscultation bilaterally GI: soft, not very distended or tender.  some output thru the ileostomy.  BS hypoactive.    Lab Results:  Recent Labs    01/17/19 0351  WBC 13.7*  HGB 11.5*  HCT 37.6  PLT 279    BMET Recent Labs    01/17/19 0351  NA 141  K 4.0  CL 101  CO2 28  GLUCOSE 164*  BUN 24*  CREATININE 1.52*  CALCIUM 9.4   PT/INR No results for input(s): LABPROT, INR in the last 72 hours.  Recent Labs  Lab 01/17/19 0351  AST 24  ALT 53*  ALKPHOS 305*  BILITOT 0.6  PROT 7.4  ALBUMIN 2.9*     Lipase     Component Value Date/Time   LIPASE 27 01/17/2019 0351     Medications: . [START ON 01/19/2019] fentaNYL   1 patch Transdermal Q72H  . insulin aspart  0-9 Units Subcutaneous TID WC  . levothyroxine  75 mcg Intravenous QAC breakfast    Assessment/Plan Hypertension Type II diabetes Hx Breast cancer Hx endometrail ovarian cancer Ureteral stricture hx/ureteral stent _ Dr. Clint Lipps Chronic pain - fentanyl/Norco/Lyrica CKD   SBO  - hx recurrent endometrial/ ovarian cancer (ex lap,posterior supralevator pelvic exenteration, end colostomy, bladder repair, ureteral stentingwith complete resection and negative margins on 11/09/14. 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  I will recheck the film this AM.  She has some output through her ileostomy.  NG is functional.  I will also work on getting her up and moving some with assistance.         LOS: 1 day    Eldra Word 01/18/2019 [phone]

## 2019-01-18 NOTE — Consult Note (Addendum)
[facility] Nurse ostomy consult note Pt is being followed by surgical team; requested to determine if patient needs assistance with pouch changes or obtaining supplies.  Pt is familiar to [facility] team from numerous previous consults, since she had an ileostomy surgery performed in 2018.  She denies need for assistance and states she and her husband have developed a complex pouch application process with several products which are not available in the [place].  She states that she has supplies at the bedside and her husband will assist with pouch changes when necessary.  She uses a hypo-allergenic one piece convex pouch from [employer], a barrier ring, [place] skin protection, Adhesive spray, and a belt.  Current pouch is intact with a good seal and mod amt semi-formed brown stool.  She states she obtains 4 days of wear time for her pouching system. She states she also has a "special dressing routine" to her nephrostomy tube site, and her husband will bring in products from home and change the dressing every 2 days.  Plan: Husband will change ostomy pouch twice weekly and PRN. Pt denies need for further assistance at this time.  Please re-consult if further assistance is needed.  Thank-you,  Julien Girt MSN, [place], [place], [employer], [place]

## 2019-01-19 ENCOUNTER — Inpatient Hospital Stay (HOSPITAL_COMMUNITY): Payer: BC Managed Care – PPO

## 2019-01-19 DIAGNOSIS — E860 Dehydration: Secondary | ICD-10-CM | POA: Diagnosis not present

## 2019-01-19 DIAGNOSIS — L89309 Pressure ulcer of unspecified buttock, unspecified stage: Secondary | ICD-10-CM | POA: Diagnosis not present

## 2019-01-19 DIAGNOSIS — E785 Hyperlipidemia, unspecified: Secondary | ICD-10-CM | POA: Diagnosis not present

## 2019-01-19 LAB — GLUCOSE, CAPILLARY
Glucose-Capillary: 66 mg/dL — ABNORMAL LOW (ref 70–99)
Glucose-Capillary: 148 mg/dL — ABNORMAL HIGH (ref 70–99)
Glucose-Capillary: 79 mg/dL (ref 70–99)
Glucose-Capillary: 77 mg/dL (ref 70–99)
Glucose-Capillary: 85 mg/dL (ref 70–99)
Glucose-Capillary: 115 mg/dL — ABNORMAL HIGH (ref 70–99)
Glucose-Capillary: 115 mg/dL — ABNORMAL HIGH (ref 70–99)

## 2019-01-19 LAB — CBC
HCT: 31.7 % — ABNORMAL LOW (ref 36.0–46.0)
Hemoglobin: 9.5 g/dL — ABNORMAL LOW (ref 12.0–15.0)
MCH: 29.6 pg (ref 26.0–34.0)
MCHC: 30 g/dL (ref 30.0–36.0)
MCV: 98.8 fL (ref 80.0–100.0)
Platelets: 213 10*3/uL (ref 150–400)
RBC: 3.21 MIL/uL — ABNORMAL LOW (ref 3.87–5.11)
RDW: 14.2 % (ref 11.5–15.5)
WBC: 4.6 10*3/uL (ref 4.0–10.5)
nRBC: 0 % (ref 0.0–0.2)

## 2019-01-19 LAB — BASIC METABOLIC PANEL
Anion gap: 8 (ref 5–15)
BUN: 20 mg/dL (ref 8–23)
CO2: 29 mmol/L (ref 22–32)
Calcium: 8.9 mg/dL (ref 8.9–10.3)
Chloride: 110 mmol/L (ref 98–111)
Creatinine, Ser: 1.25 mg/dL — ABNORMAL HIGH (ref 0.44–1.00)
GFR calc Af Amer: 53 mL/min — ABNORMAL LOW (ref >60)
GFR calc non Af Amer: 45 mL/min — ABNORMAL LOW (ref >60)
Glucose, Bld: 80 mg/dL (ref 70–99)
Potassium: 3.4 mmol/L — ABNORMAL LOW (ref 3.5–5.1)
Sodium: 147 mmol/L — ABNORMAL HIGH (ref 135–145)

## 2019-01-19 LAB — MAGNESIUM: Magnesium: 1.9 mg/dL (ref 1.7–2.4)

## 2019-01-19 MED ORDER — DEXTROSE-NACL 5-0.9 % IV SOLN
INTRAVENOUS | Status: DC
  Administered 2019-01-19 – 2019-01-21 (×6): via INTRAVENOUS

## 2019-01-19 MED ORDER — POTASSIUM CHLORIDE 10 MEQ/100ML IV SOLN
10.0000 meq | INTRAVENOUS | Status: AC
  Administered 2019-01-19 (×4): 10 meq via INTRAVENOUS
  Filled 2019-01-19 (×4): qty 100

## 2019-01-19 MED ORDER — DEXTROSE 50 % IV SOLN
50.0000 mL | Freq: Once | INTRAVENOUS | Status: AC
  Administered 2019-01-19: 50 mL via INTRAVENOUS
  Filled 2019-01-19: qty 50

## 2019-01-19 NOTE — Progress Notes (Signed)
Patient has had two hypoglycemic events this shift.Hypoglycemic protocol used and D 50 given.Patient remains NPO.NP K.[place] notified.No new orders.Will continue to monitor.

## 2019-01-19 NOTE — Progress Notes (Signed)
Subjective/Chief Complaint: Pt with no changes overnight Ambulating Some ostomy output   Objective: Vital signs in last 24 hours: Temp:  [98 F (36.7 C)-98.3 F (36.8 C)] 98.3 F (36.8 C) (06/19 0430) Pulse Rate:  [61-66] 61 (06/19 0430) Resp:  [16-18] 16 (06/19 0430) BP: (127-142)/(55-64) 141/64 (06/19 0430) SpO2:  [99 %-100 %] 99 % (06/19 0430) Weight:  [94.7 kg] 94.7 kg (06/19 0615) Last BM Date: 01/18/19  Intake/Output from previous day: 06/18 0701 - 06/19 0700 In: 392.9 [I.V.:392.9] Out: 1035 [Urine:625; Emesis/NG output:300; Stool:110] Intake/Output this shift: No intake/output data recorded.  Constitutional: No acute distress, conversant, appears states age. Eyes: Anicteric sclerae, moist conjunctiva, no lid lag Lungs: Clear to auscultation bilaterally, normal respiratory effort CV: regular rate and rhythm, no murmurs, no peripheral edema, pedal pulses 2+ GI: Soft, no masses or hepatosplenomegaly, non-tender to palpation, ostomy patent, non distended, no rebound/guarding Skin: No rashes, palpation reveals normal turgor Psychiatric: appropriate judgment and insight, oriented to person, place, and time   Lab Results:  Recent Labs    01/17/19 0351 01/19/19 0339  WBC 13.7* 4.6  HGB 11.5* 9.5*  HCT 37.6 31.7*  PLT 279 213   BMET Recent Labs    01/17/19 0351 01/19/19 0339  NA 141 147*  K 4.0 3.4*  CL 101 110  CO2 28 29  GLUCOSE 164* 80  BUN 24* 20  CREATININE 1.52* 1.25*  CALCIUM 9.4 8.9   Studies/Results: Dg Abd 1 View  Result Date: 01/18/2019 CLINICAL DATA:  Small-bowel protocol. 24 hour delay after Gastrografin administration. EXAM: ABDOMEN - 1 VIEW COMPARISON:  01/17/2019 FINDINGS: Stable position of the bilateral nephrostomy tubes. Nasogastric tube in region of the stomach body. There continues to be gas-filled loops of small bowel in the mid abdomen. Oral contrast is not well demonstrated on this examination. Again noted are multiple surgical  clips in the abdominopelvic region. Limited evaluation for free air on this supine image. IMPRESSION: Persistent gas-filled loops of bowel in the central abdomen. Bowel gas is less obvious on this examination and may be related to recent Gastrografin administration. Findings remain compatible with a small bowel obstruction. Electronically Signed   By: Markus Daft M.D.   On: 01/18/2019 12:04   Dg Abd 1 View  Result Date: 01/17/2019 CLINICAL DATA:  Replaced nasogastric tube EXAM: ABDOMEN - 1 VIEW COMPARISON:  Portable exam 1045 hours compared to 0727 hours FINDINGS: Tip of NG tube projects over stomach with proximal side-port projecting just above gastroesophageal junction. Dilated loop of small bowel in the LEFT upper quadrant. BILATERAL for ostomy tubes present. Lung bases clear. IMPRESSION: Proximal side-port of nasogastric tube projects over distal esophagus, recommend advancing tube 2 cm. Electronically Signed   By: [name] M.D.   On: 01/17/2019 12:09   Dg Abd Portable 1v-small Bowel Obstruction Protocol-initial, 8 Hr Delay  Result Date: 01/17/2019 CLINICAL DATA:  Small bowel obstruction EXAM: PORTABLE ABDOMEN - 1 VIEW COMPARISON:  01/17/2019 FINDINGS: Bilateral nephrostomy catheters and NG tube remain in place, unchanged. Dilated small bowel loops noted in the abdomen compatible with small bowel obstruction. No significant change since prior study. Prior cholecystectomy. No organomegaly or visible free air. IMPRESSION: Stable small bowel obstruction pattern. Electronically Signed   By: Rolm Baptise M.D.   On: 01/17/2019 21:48   Dg Abd Portable 1v-small Bowel Protocol-position Verification  Result Date: 01/17/2019 CLINICAL DATA:  NG tube placement. EXAM: PORTABLE ABDOMEN - 1 VIEW COMPARISON:  One-view abdomen 01/17/2019 FINDINGS: Side port of the  NG tube is now at the GE junction. Would advance 5 cm for more optimal positioning. Dilated loops of bowel are again noted in the left upper quadrant. The  stomach is collapsed. Nephrostomy tubes are stable. IMPRESSION: 1. The NG tube scratched at the side port of the NG tube is at the GE junction. Would advance 5 cm for more optimal positioning. Electronically Signed   By: San Morelle M.D.   On: 01/17/2019 10:04    Assessment/Plan: Hypertension Type II diabetes Hx Breast cancer Hx endometrail ovarian cancer Ureteral stricture hx/ureteral stent _ Dr. Clint Lipps Chronic pain - fentanyl/Norco/Lyrica CKD   SBO - hx recurrent endometrial/ ovarian cancer (ex lap,posterior supralevator pelvic exenteration, end colostomy, bladder repair, ureteral stentingwith complete resection and negative margins on 11/09/14. 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  Con't NGT. Change IVF ti D5NS as pt with hypoglycemic episodes x2 overnight mobilize   LOS: 2 days    Ralene Ok 01/19/2019

## 2019-01-19 NOTE — Progress Notes (Signed)
VAST consulted to draw Magnesium from PICC. Patient had a BMP drawn this am. VAST RN called lab and requested Mg be added on to blood in lab.  VAST RN called unit RN and advised of add on lab and to be on lookout for results.

## 2019-01-19 NOTE — Progress Notes (Signed)
PROGRESS NOTE    [name]  XHF:[id number] DOB: 04-10-1956 DOA: 01/17/2019 PCP: Ann Held, DO  Outpatient Specialists:    Brief Narrative:  As per H and P "[name] is a 63 y.o. female with history of endometrial and ovarian cancer status post surgery complicated with infection and presently patient has bilateral nephrostomy ileostomy and Foley catheter was recently treated for UTI with IV antibiotics until yesterday presents to the ER with complaint of persistent abdominal pain diffuse in nature with multiple episodes of nausea vomiting.  Pain has been going on for last 2 days.  Denies chest pain or shortness of breath fever chills or productive cough.  ED Course: In the ER patient had a CT abdomen pelvis which shows small bowel obstruction with transition point.  On-call general surgeon was consulted patient has NG tube placed and admitted for further management of small bowel obstruction.  Labs revealed elevated creatinine at baseline of 1.5 mild anemia".  01/18/2019: SBO persists.  Surgical input is appreciated.  NG tube is still draining a lot of secretions.  01/19/2019: Patient seen.  Feels better today.  Surgery and pruritus appreciated.  SBO persists.  Continue conservative management for now.  Assessment & Plan:   Principal Problem:   SBO (small bowel obstruction) (HCC) Active Problems:   Primary hypothyroidism   Benign essential HTN   Type II diabetes mellitus with renal manifestations (HCC)    Small bowel obstruction: Continue NG tube to low suction Surgical input is appreciated. Supportive care. Ensure adequate hydration. Optimize electrolytes.  01/19/2019: Continue conservative management.  Hypothyroidism: Continue Synthroid.    Diabetes mellitus type 2: Accu-Cheks. Sliding scale insulin coverage.  Chronic kidney disease stage III Creatinine appears to be at baseline follow metabolic panel. Continue to monitor closely.  Anemia: Stable.  Continue to monitor.  Hypertension: Continue to optimize.   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   DVT prophylaxis: SCDs in anticipation of possible procedure. Code Status: Full code. Family Communication: Discussed with patient. Disposition Plan: Home. Consults called: General surgery.   Procedures:   NG-tube placement  Antimicrobials:   None   Subjective: No new complaints SBO persists  Objective: Vitals:   01/18/19 1357 01/18/19 1927 01/19/19 0430 01/19/19 0615  BP: (!) 127/55 (!) 142/59 (!) 141/64   Pulse: 61 66 61   Resp: 18 18 16    Temp: 98 F (36.7 C) 98.2 F (36.8 C) 98.3 F (36.8 C)   TempSrc: Oral Oral Oral   SpO2: 100% 100% 99%   Weight:    94.7 kg  Height:        Intake/Output Summary (Last 24 hours) at 01/19/2019 1633 Last data filed at 01/19/2019 1500 Gross per 24 hour  Intake 613.58 ml  Output 1085 ml  Net -471.42 ml   Filed Weights   01/17/19 0315 01/17/19 0608 01/19/19 0615  Weight: 87.1 kg 90.7 kg 94.7 kg    Examination:  General exam: Appears calm and comfortable.  Bilateral nephrostomy tube is in place.  Ileostomy noted. Respiratory system: Clear to auscultation.  Cardiovascular system: S1 & S2  Gastrointestinal system: Abdomen is nondistended, soft and nontender.  Central nervous system: Alert and oriented. No focal neurological deficits. Extremities: Symmetric 5 x 5 power.  Data Reviewed: I have personally reviewed following labs and imaging studies  CBC: Recent  Labs  Lab 01/17/19 0351 01/19/19 0339  WBC 13.7* 4.6  NEUTROABS 11.7*  --   HGB 11.5* 9.5*  HCT 37.6 31.7*  MCV 96.2 98.8  PLT 279 403   Basic Metabolic Panel: Recent Labs  Lab 01/17/19 0351 01/19/19 0339  NA 141 147*  K 4.0 3.4*  CL  101 110  CO2 28 29  GLUCOSE 164* 80  BUN 24* 20  CREATININE 1.52* 1.25*  CALCIUM 9.4 8.9  MG  --  1.9   GFR: Estimated Creatinine Clearance: 49.3 mL/min (A) (by C-G formula based on SCr of 1.25 mg/dL (H)). Liver Function Tests: Recent Labs  Lab 01/17/19 0351  AST 24  ALT 53*  ALKPHOS 305*  BILITOT 0.6  PROT 7.4  ALBUMIN 2.9*   Recent Labs  Lab 01/17/19 0351  LIPASE 27   No results for input(s): AMMONIA in the last 168 hours. Coagulation Profile: No results for input(s): INR, PROTIME in the last 168 hours. Cardiac Enzymes: No results for input(s): CKTOTAL, CKMB, CKMBINDEX, TROPONINI in the last 168 hours. BNP (last 3 results) No results for input(s): PROBNP in the last 8760 hours. HbA1C: No results for input(s): HGBA1C in the last 72 hours. CBG: Recent Labs  Lab 01/18/19 2300 01/19/19 0432 01/19/19 0534 01/19/19 0751 01/19/19 1212  GLUCAP 107* 66* 148* 79 77   Lipid Profile: No results for input(s): CHOL, HDL, LDLCALC, TRIG, CHOLHDL, LDLDIRECT in the last 72 hours. Thyroid Function Tests: No results for input(s): TSH, T4TOTAL, FREET4, T3FREE, THYROIDAB in the last 72 hours. Anemia Panel: No results for input(s): VITAMINB12, FOLATE, FERRITIN, TIBC, IRON, RETICCTPCT in the last 72 hours. Urine analysis:    Component Value Date/Time   COLORURINE YELLOW 10/04/2018 1320   APPEARANCEUR HAZY (A) 10/04/2018 1320   LABSPEC 1.017 10/04/2018 1320   LABSPEC 1.015 11/17/2015 1436   PHURINE 6.0 10/04/2018 1320   GLUCOSEU NEGATIVE 10/04/2018 1320   GLUCOSEU 2,000 11/17/2015 1436   HGBUR LARGE (A) 10/04/2018 1320   BILIRUBINUR NEGATIVE 10/04/2018 1320   BILIRUBINUR neg 08/22/2018 1409   BILIRUBINUR Negative 11/17/2015 1436   KETONESUR NEGATIVE 10/04/2018 1320   PROTEINUR 100 (A) 10/04/2018 1320   UROBILINOGEN 0.2 08/22/2018 1409   UROBILINOGEN 0.2 11/17/2015 1436   NITRITE NEGATIVE 10/04/2018 1320   LEUKOCYTESUR MODERATE (A) 10/04/2018 1320   LEUKOCYTESUR  Moderate 11/17/2015 1436   Sepsis Labs: @LABRCNTIP (procalcitonin:4,lacticidven:4)  ) Recent Results (from the past 240 hour(s))  Novel Coronavirus,NAA,(SEND-OUT TO REF LAB - TAT 24-48 hrs); Hosp Order     Status: None   Collection Time: 01/17/19  4:47 AM   Specimen: Nasopharyngeal Swab; Respiratory  Result Value Ref Range Status   SARS-CoV-2, NAA NOT DETECTED NOT DETECTED Final    Comment: (NOTE) This test was developed and its performance characteristics determined by [employer]. This test has not been FDA cleared or approved. This test has been authorized by FDA under an Emergency Use Authorization (EUA). This test is only authorized for the duration of time the declaration that circumstances exist justifying the authorization of the emergency use of in vitro diagnostic tests for detection of SARS-CoV-2 virus and/or diagnosis of COVID-19 infection under section 564(b)(1) of the Act, 21 U.S.C. 474QVZ-5(G)(3), unless the authorization is terminated or revoked sooner. When diagnostic testing is negative, the possibility of a false negative result should be considered in the context of a patient's recent exposures and the presence of clinical signs and symptoms consistent with COVID-19. An individual without symptoms of COVID-19 and who is  not shedding SARS-CoV-2 virus would expect to have a negative (not detected) result in this assay. Performed  At: [facility] [address] [place], [place] [id number] Rush Farmer MD GM:[id number]    [place]  Final    Comment: Performed at [facility], [place] [address] [place], [place] [address]         Radiology Studies: Dg Abd 1 View  Result Date: 01/18/2019 CLINICAL DATA:  Small-bowel protocol. 24 hour delay after Gastrografin administration. EXAM: ABDOMEN - 1 VIEW COMPARISON:  01/17/2019 FINDINGS: Stable position of the bilateral nephrostomy tubes. Nasogastric tube in region of the  stomach body. There continues to be gas-filled loops of small bowel in the mid abdomen. Oral contrast is not well demonstrated on this examination. Again noted are multiple surgical clips in the abdominopelvic region. Limited evaluation for free air on this supine image. IMPRESSION: Persistent gas-filled loops of bowel in the central abdomen. Bowel gas is less obvious on this examination and may be related to recent Gastrografin administration. Findings remain compatible with a small bowel obstruction. Electronically Signed   By: Markus Daft M.D.   On: 01/18/2019 12:04   Dg Chest Port 1 View  Result Date: 01/19/2019 CLINICAL DATA:  history of endometrial and ovarian cancer status post surgery complicated with infection and presently patient has bilateral nephrostomy ileostomy and Foley catheter was recently treated for UTI with IV antibiotics until yesterday EXAM: PORTABLE CHEST 1 VIEW COMPARISON:  10/04/2018 and 11/29/2017 radiographs. FINDINGS: Enteric tube projects below the diaphragm, tip not visualized. Right arm PICC is unchanged at the mid SVC level. The heart size and mediastinal contours are stable. There is mildly increased opacity at the left lung base, probably atelectasis. No pneumothorax or significant pleural effusion. IMPRESSION: Satisfactory position of the enteric tube. Mildly increased left basilar atelectasis. Electronically Signed   By: Richardean Sale M.D.   On: 01/19/2019 09:24   Dg Abd Portable 1v  Result Date: 01/19/2019 CLINICAL DATA:  Followup small-bowel obstruction. EXAM: PORTABLE ABDOMEN - 1 VIEW COMPARISON:  01/18/2019 FINDINGS: Bilateral nephrostomy tubes are stable. The NG tube is in the fundal region of the stomach and the proximal port is likely at or above the GE junction. Single loop of air distended small bowel in the left upper quadrant but overall slight improved bowel gas pattern. No free air. IMPRESSION: 1. The NG tube tip has pulled back into the fundal region. 2.  Single slightly dilated air-filled loop of small bowel in the left upper quadrant but overall improved bowel gas pattern. Electronically Signed   By: Marijo Sanes M.D.   On: 01/19/2019 14:00   Dg Abd Portable 1v-small Bowel Obstruction Protocol-initial, 8 Hr Delay  Result Date: 01/17/2019 CLINICAL DATA:  Small bowel obstruction EXAM: PORTABLE ABDOMEN - 1 VIEW COMPARISON:  01/17/2019 FINDINGS: Bilateral nephrostomy catheters and NG tube remain in place, unchanged. Dilated small bowel loops noted in the abdomen compatible with small bowel obstruction. No significant change since prior study. Prior cholecystectomy. No organomegaly or visible free air. IMPRESSION: Stable small bowel obstruction pattern. Electronically Signed   By: Rolm Baptise M.D.   On: 01/17/2019 21:48        Scheduled Meds: . fentaNYL  1 patch Transdermal Q72H  . insulin aspart  0-9 Units Subcutaneous TID WC  . levothyroxine  75 mcg Intravenous QAC breakfast   Continuous Infusions: . dextrose 5 % and 0.9% NaCl 100 mL/hr at 01/19/19 1418  . potassium chloride 10 mEq (01/19/19  1544)     LOS: 2 days    Time spent: 43 Minutes    Dana Allan, MD  [facility] Pager #: [phone] 7PM-7AM contact night coverage as above

## 2019-01-20 ENCOUNTER — Inpatient Hospital Stay (HOSPITAL_COMMUNITY): Payer: BC Managed Care – PPO

## 2019-01-20 LAB — GLUCOSE, CAPILLARY
Glucose-Capillary: 103 mg/dL — ABNORMAL HIGH (ref 70–99)
Glucose-Capillary: 119 mg/dL — ABNORMAL HIGH (ref 70–99)
Glucose-Capillary: 169 mg/dL — ABNORMAL HIGH (ref 70–99)
Glucose-Capillary: 76 mg/dL (ref 70–99)
Glucose-Capillary: 98 mg/dL (ref 70–99)
Glucose-Capillary: 99 mg/dL (ref 70–99)

## 2019-01-20 NOTE — Progress Notes (Signed)
Patient has decreased output to left nephrostomy tube. Paged MD regarding patient and her concerns. New orders received. Patient to have IR consult to evaluate. Will continue to monitor for remainder of shift.

## 2019-01-20 NOTE — Progress Notes (Signed)
Pt is concerned about her decreased urine output through her left nephrostomy and wanting this RN to call IR to get it evaluated. Explained to pt that this RN cannot call IR directly and will endorsed it to day shift appropriately .

## 2019-01-20 NOTE — Progress Notes (Signed)
PROGRESS NOTE    [name]  LXB:[id number] DOB: [birth year] DOA: 01/17/2019 PCP: Ann Held, DO  Outpatient Specialists:    Brief Narrative:  As per H and P "[name] is a 63 y.o. female with history of endometrial and ovarian cancer status post surgery complicated with infection and presently patient has bilateral nephrostomy ileostomy and Foley catheter was recently treated for UTI with IV antibiotics until yesterday presents to the ER with complaint of persistent abdominal pain diffuse in nature with multiple episodes of nausea vomiting.  Pain has been going on for last 2 days.  Denies chest pain or shortness of breath fever chills or productive cough.  ED Course: In the ER patient had a CT abdomen pelvis which shows small bowel obstruction with transition point.  On-call general surgeon was consulted patient has NG tube placed and admitted for further management of small bowel obstruction.  Labs revealed elevated creatinine at baseline of 1.5 mild anemia".  01/18/2019: SBO persists.  Surgical input is appreciated.  NG tube is still draining a lot of secretions.  01/19/2019: Patient seen.  Feels better today.  Surgery and pruritus appreciated.  SBO persists.  Continue conservative management for now.  01/20/2019: Patient seen.  Input from interventional radiology team is appreciated (kindly refer to diagnosis).  The patient was initially concerned that the left nephrostomy tube was blocked.  Interventional radiology team is monitoring.  Results of repeat abdominal x-ray is pending.  However, patient reports that there has been increased output into the ileostomy bag.  Patient also feels better.  Continue to manage SBO expectantly.  Assessment & Plan:   Principal Problem:   SBO (small bowel obstruction) (HCC) Active Problems:   Primary hypothyroidism   Benign essential HTN   Type II diabetes mellitus with renal manifestations (HCC)    Small bowel obstruction:  Continue NG tube to low suction Surgical input is appreciated. Supportive care. Ensure adequate hydration. Optimize electrolytes.  01/20/2019: Continue conservative management.  Seems to be improving.  Hypothyroidism: Continue Synthroid.    Diabetes mellitus type 2: Accu-Cheks. Sliding scale insulin coverage.  Chronic kidney disease stage III Creatinine appears to be at baseline follow metabolic panel. Continue to monitor closely.  Anemia: Stable. Continue to monitor.  Hypertension: Continue to optimize.   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  Possible nephrostomy tube blockage on the left side: Interventional radiology team is managing. Nephrostomy tube backslash being changed.  DVT prophylaxis: SCDs in anticipation of possible procedure. Code Status: Full code. Family Communication: Discussed with patient. Disposition Plan: Home. Consults called: General surgery.   Procedures:   NG-tube placement  Antimicrobials:   None   Subjective: No new complaints SBO persists  Objective: Vitals:   01/19/19 0615 01/19/19 2027 01/20/19 0432 01/20/19 1438  BP:  (!) 123/54 131/72 (!) 124/58  Pulse:  (!) 57 (!) 51 (!) 59  Resp:  14 14   Temp:  98.1 F (36.7 C) 98.3 F (36.8 C) 98.3 F (36.8 C)  TempSrc:  Oral Oral Oral  SpO2:  99% 99% 100%  Weight: 94.7 kg     Height:        Intake/Output Summary (Last 24 hours) at 01/20/2019 1503 Last data filed at 01/20/2019 0625 Gross per 24 hour  Intake 120 ml  Output 2425 ml  Net -2305 ml  Filed Weights   01/17/19 0315 01/17/19 0608 01/19/19 0615  Weight: 87.1 kg 90.7 kg 94.7 kg    Examination:  General exam: Appears calm and comfortable.  Bilateral nephrostomy tube is in place.  Ileostomy  noted. Respiratory system: Clear to auscultation.  Cardiovascular system: S1 & S2  Gastrointestinal system: Abdomen is nondistended, soft and nontender.  Central nervous system: Alert and oriented. No focal neurological deficits. Extremities: Symmetric 5 x 5 power.  Data Reviewed: I have personally reviewed following labs and imaging studies  CBC: Recent Labs  Lab 01/17/19 0351 01/19/19 0339  WBC 13.7* 4.6  NEUTROABS 11.7*  --   HGB 11.5* 9.5*  HCT 37.6 31.7*  MCV 96.2 98.8  PLT 279 623   Basic Metabolic Panel: Recent Labs  Lab 01/17/19 0351 01/19/19 0339  NA 141 147*  K 4.0 3.4*  CL 101 110  CO2 28 29  GLUCOSE 164* 80  BUN 24* 20  CREATININE 1.52* 1.25*  CALCIUM 9.4 8.9  MG  --  1.9   GFR: Estimated Creatinine Clearance: 49.3 mL/min (A) (by C-G formula based on SCr of 1.25 mg/dL (H)). Liver Function Tests: Recent Labs  Lab 01/17/19 0351  AST 24  ALT 53*  ALKPHOS 305*  BILITOT 0.6  PROT 7.4  ALBUMIN 2.9*   Recent Labs  Lab 01/17/19 0351  LIPASE 27   No results for input(s): AMMONIA in the last 168 hours. Coagulation Profile: No results for input(s): INR, PROTIME in the last 168 hours. Cardiac Enzymes: No results for input(s): CKTOTAL, CKMB, CKMBINDEX, TROPONINI in the last 168 hours. BNP (last 3 results) No results for input(s): PROBNP in the last 8760 hours. HbA1C: No results for input(s): HGBA1C in the last 72 hours. CBG: Recent Labs  Lab 01/19/19 2029 01/19/19 2329 01/20/19 0434 01/20/19 0805 01/20/19 1222  GLUCAP 115* 115* 103* 99 169*   Lipid Profile: No results for input(s): CHOL, HDL, LDLCALC, TRIG, CHOLHDL, LDLDIRECT in the last 72 hours. Thyroid Function Tests: No results for input(s): TSH, T4TOTAL, FREET4, T3FREE, THYROIDAB in the last 72 hours. Anemia Panel: No results for input(s): VITAMINB12, FOLATE, FERRITIN, TIBC, IRON, RETICCTPCT in the last 72 hours. Urine analysis:    Component Value Date/Time   COLORURINE YELLOW  10/04/2018 1320   APPEARANCEUR HAZY (A) 10/04/2018 1320   LABSPEC 1.017 10/04/2018 1320   LABSPEC 1.015 11/17/2015 1436   PHURINE 6.0 10/04/2018 1320   GLUCOSEU NEGATIVE 10/04/2018 1320   GLUCOSEU 2,000 11/17/2015 1436   HGBUR LARGE (A) 10/04/2018 1320   BILIRUBINUR NEGATIVE 10/04/2018 1320   BILIRUBINUR neg 08/22/2018 1409   BILIRUBINUR Negative 11/17/2015 1436   KETONESUR NEGATIVE 10/04/2018 1320   PROTEINUR 100 (A) 10/04/2018 1320   UROBILINOGEN 0.2 08/22/2018 1409   UROBILINOGEN 0.2 11/17/2015 1436   NITRITE NEGATIVE 10/04/2018 1320   LEUKOCYTESUR MODERATE (A) 10/04/2018 1320   LEUKOCYTESUR Moderate 11/17/2015 1436   Sepsis Labs: @LABRCNTIP (procalcitonin:4,lacticidven:4)  ) Recent Results (from the past 240 hour(s))  Novel Coronavirus,NAA,(SEND-OUT TO REF LAB - TAT 24-48 hrs); Hosp Order     Status: None   Collection Time: 01/17/19  4:47 AM   Specimen: Nasopharyngeal Swab; Respiratory  Result Value Ref Range Status   SARS-CoV-2, NAA NOT DETECTED NOT DETECTED Final    Comment: (NOTE) This test was developed and its performance characteristics determined by [employer]. This test has not been FDA cleared or approved. This test has been authorized by FDA under an Emergency Use Authorization (EUA). This test is only authorized for  the duration of time the declaration that circumstances exist justifying the authorization of the emergency use of in vitro diagnostic tests for detection of SARS-CoV-2 virus and/or diagnosis of COVID-19 infection under section 564(b)(1) of the Act, 21 U.S.C. 625WLS-9(H)(7), unless the authorization is terminated or revoked sooner. When diagnostic testing is negative, the possibility of a false negative result should be considered in the context of a patient's recent exposures and the presence of clinical signs and symptoms consistent with COVID-19. An individual without symptoms of COVID-19 and who is not shedding SARS-CoV-2 virus would  expect to have a negative (not detected) result in this assay. Performed  At: [facility] [address] [place], [place] [id number] Rush Farmer MD XB:[id number]    [place]  Final    Comment: Performed at [facility], [place] [address], [place], [place] [address]         Radiology Studies: Dg Chest [place] 1 View  Result Date: 01/19/2019 CLINICAL DATA:  history of endometrial and ovarian cancer status post surgery complicated with infection and presently patient has bilateral nephrostomy ileostomy and Foley catheter was recently treated for UTI with IV antibiotics until yesterday EXAM: PORTABLE CHEST 1 VIEW COMPARISON:  10/04/2018 and 11/29/2017 radiographs. FINDINGS: Enteric tube projects below the diaphragm, tip not visualized. Right arm PICC is unchanged at the mid SVC level. The heart size and mediastinal contours are stable. There is mildly increased opacity at the left lung base, probably atelectasis. No pneumothorax or significant pleural effusion. IMPRESSION: Satisfactory position of the enteric tube. Mildly increased left basilar atelectasis. Electronically Signed   By: Richardean Sale M.D.   On: 01/19/2019 09:24   Dg Abd Portable 1v  Result Date: 01/19/2019 CLINICAL DATA:  Followup small-bowel obstruction. EXAM: PORTABLE ABDOMEN - 1 VIEW COMPARISON:  01/18/2019 FINDINGS: Bilateral nephrostomy tubes are stable. The NG tube is in the fundal region of the stomach and the proximal port is likely at or above the GE junction. Single loop of air distended small bowel in the left upper quadrant but overall slight improved bowel gas pattern. No free air. IMPRESSION: 1. The NG tube tip has pulled back into the fundal region. 2. Single slightly dilated air-filled loop of small bowel in the left upper quadrant but overall improved bowel gas pattern. Electronically Signed   By: Marijo Sanes M.D.   On: 01/19/2019 14:00        Scheduled Meds: .  fentaNYL  1 patch Transdermal Q72H  . insulin aspart  0-9 Units Subcutaneous TID WC  . levothyroxine  75 mcg Intravenous QAC breakfast   Continuous Infusions: . dextrose 5 % and 0.9% NaCl 100 mL/hr at 01/20/19 0622     LOS: 3 days    Time spent: 25 Minutes    Dana Allan, MD  [facility] Pager #: [phone] 7PM-7AM contact night coverage as above

## 2019-01-20 NOTE — Progress Notes (Signed)
Referring Physician(s): Bonnell Public  Supervising Physician: Markus Daft  Patient Status:  [name] - In-pt  Chief Complaint: "Problem with my left tube"  Subjective:  Patient with history of endometrial cancer complication by developed of bilateral ureteral obstruction s/p bilateral nephrostomy tube placements in IR 09/02/2017 by Dr. Earleen Newport (most recently bilaterally exchanged in IR 01/05/2019 by Dr. Pascal Lux). Patient awake and alert sitting in chair. Complains of nausea and states "I just don't feel right" and that "something is wrong with my left tube". States that normally her left tube produces twice as much urine as her right tube, however she has had no output in the past 12 hours from left tube. Bilateral nephrostomy tube sites c/d/i.   Allergies: Penicillins, Cefaclor, Erythromycin, Tape, Trimethoprim, Ultram [tramadol], Cephalosporins, Fluconazole, Oxycodone, Pectin, Septra [sulfamethoxazole-trimethoprim], and Sulfa antibiotics  Medications: Prior to Admission medications   Medication Sig Start Date End Date Taking? Authorizing Provider  acetaminophen (TYLENOL) 325 MG tablet Take 2 tablets (650 mg total) by mouth every 6 (six) hours as needed. 09/24/17  Yes Emokpae, Courage, MD  anastrozole (ARIMIDEX) 1 MG tablet TAKE 1 TABLET DAILY Patient taking differently: Take 1 mg by mouth daily.  01/30/18  Yes Gorsuch, Ni, MD  Biotin 5 MG TABS Take 5 mg by mouth daily.    Yes [provider]  buPROPion (WELLBUTRIN SR) 200 MG 12 hr tablet Take 1 tablet (200 mg total) by mouth daily. 01/09/19  Yes Beasley, Caren D, MD  Cholecalciferol (VITAMIN D3) 10000 UNITS capsule Take 10,000 Units by mouth every Sunday.    Yes [provider]  clobetasol (OLUX) 0.05 % topical foam Apply topically 2 (two) times daily.   Yes [provider]  diphenhydrAMINE (BENADRYL) 25 mg capsule Take 1 capsule (25 mg total) by mouth every 8 (eight) hours as needed for itching, allergies or  sleep. 09/24/17  Yes Emokpae, Courage, MD  diphenoxylate-atropine (LOMOTIL) 2.5-0.025 MG tablet Take 1 tablet by mouth 4 (four) times daily as needed for diarrhea or loose stools. TO PREVENT LOOSE BOWEL MOVEMENTS 09/24/17  Yes Emokpae, Courage, MD  fentaNYL (DURAGESIC) 50 MCG/HR Place 1 patch onto the skin every 3 (three) days. 01/08/19  Yes [name] R, DO  ferrous sulfate 325 (65 FE) MG tablet Take 1 tablet (325 mg total) by mouth at bedtime. 09/24/17  Yes Emokpae, Courage, MD  filgrastim-sndz (ZARXIO) 480 MCG/0.8ML SOSY injection Inject 0.8 mLs (480 mcg total) into the skin as directed. Inject 480 mcg every 5 days 12/04/18 12/04/19 Yes Gorsuch, Ni, MD  HYDROcodone-acetaminophen (NORCO/VICODIN) 5-325 MG tablet Take 2 tablets by mouth every 6 (six) hours as needed for moderate pain. 01/08/19  Yes Lowne Chase, Yvonne R, DO  JANUVIA 50 MG tablet Take 50 mg by mouth daily.  10/28/17  Yes [provider]  levothyroxine (SYNTHROID, LEVOTHROID) 150 MCG tablet Take 1 tablet (150 mcg total) by mouth daily before breakfast. 09/25/17  Yes Emokpae, Courage, MD  loratadine (CLARITIN) 10 MG tablet Take 10 mg by mouth daily.    Yes [provider]  nitrofurantoin, macrocrystal-monohydrate, (MACROBID) 100 MG capsule Take 100 mg by mouth at bedtime.   Yes [provider]  omega-3 acid ethyl esters (LOVAZA) 1 G capsule Take 1 g by mouth 2 (two) times daily.    Yes [provider]  Polyethyl Glycol-Propyl Glycol (SYSTANE OP) Place 1 drop into both eyes daily as needed (dry eyes).    Yes [provider]  pregabalin (LYRICA) 50 MG  capsule TAKE 1 CAPSULE(50 MG) BY MOUTH TWICE DAILY Patient taking differently: Take 50 mg by mouth 2 (two) times daily.  10/02/18  Yes Ann Held, DO  Prenatal Vit-Fe Fumarate-FA (PRENATAL VITAMIN PO) Take 1 capsule by mouth daily. Takes prenatal because there are no dyes in it   Yes [provider]  rosuvastatin (CRESTOR) 10 MG  tablet Take 10 mg by mouth every evening.    Yes [provider]  Saccharomyces boulardii (FLORASTOR PO) Take 1 capsule by mouth daily. 02/16/18  Yes [provider]     Vital Signs: BP 131/72 (BP Location: Left Arm)    Pulse (!) 51    Temp 98.3 F (36.8 C) (Oral)    Resp 14    Ht 5' 2.5" (1.588 m)    Wt 208 lb 12.4 oz (94.7 kg)    SpO2 99%    BMI 37.58 kg/m   Physical Exam Vitals signs and nursing note reviewed.  Constitutional:      General: She is not in acute distress. Pulmonary:     Effort: Pulmonary effort is normal. No respiratory distress.  Abdominal:     Comments: Right nephrostomy tube site without tenderness, erythema, drainage, or active bleeding; approximaltey 75 cc clear yellow urine in gravity bag; tube flushes without resistance. Left nephrostomy tube site without tenderness, erythema, drainage, or active bleeding; no output in gravity bag- when bag tubing was removed, free flowing clear urine occurred from nephrostomy tube itself; tube flushes without resistance.  Skin:    General: Skin is warm and dry.  Neurological:     Mental Status: She is alert and oriented to person, place, and time.  Psychiatric:        Mood and Affect: Mood normal.        Behavior: Behavior normal.        Thought Content: Thought content normal.        Judgment: Judgment normal.     Imaging: Dg Abd 1 View  Result Date: 01/18/2019 CLINICAL DATA:  Small-bowel protocol. 24 hour delay after Gastrografin administration. EXAM: ABDOMEN - 1 VIEW COMPARISON:  01/17/2019 FINDINGS: Stable position of the bilateral nephrostomy tubes. Nasogastric tube in region of the stomach body. There continues to be gas-filled loops of small bowel in the mid abdomen. Oral contrast is not well demonstrated on this examination. Again noted are multiple surgical clips in the abdominopelvic region. Limited evaluation for free air on this supine image. IMPRESSION: Persistent gas-filled loops of bowel in  the central abdomen. Bowel gas is less obvious on this examination and may be related to recent Gastrografin administration. Findings remain compatible with a small bowel obstruction. Electronically Signed   By: Markus Daft M.D.   On: 01/18/2019 12:04   Dg Abd 1 View  Result Date: 01/17/2019 CLINICAL DATA:  Replaced nasogastric tube EXAM: ABDOMEN - 1 VIEW COMPARISON:  Portable exam 1045 hours compared to 0727 hours FINDINGS: Tip of NG tube projects over stomach with proximal side-port projecting just above gastroesophageal junction. Dilated loop of small bowel in the LEFT upper quadrant. BILATERAL for ostomy tubes present. Lung bases clear. IMPRESSION: Proximal side-port of nasogastric tube projects over distal esophagus, recommend advancing tube 2 cm. Electronically Signed   By: [name] M.D.   On: 01/17/2019 12:09   Ct Abdomen Pelvis W Contrast  Result Date: 01/17/2019 CLINICAL DATA:  Abdominal pain, unspecified. EXAM: CT ABDOMEN AND PELVIS WITH CONTRAST TECHNIQUE: Multidetector CT imaging of the abdomen and pelvis was  performed using the standard protocol following bolus administration of intravenous contrast. CONTRAST:  10mL OMNIPAQUE IOHEXOL 300 MG/ML  SOLN COMPARISON:  CT abdomen and pelvis 01/02/2019 FINDINGS: Lower chest: Lung bases are clear without focal nodule, mass, or airspace disease. The heart size is normal. No significant pleural or pericardial effusion is present. Hepatobiliary: Mild diffuse fatty infiltration of the liver is present. No discrete lesions are evident. There is mild intra and extrahepatic biliary dilation following cholecystectomy. The biliary ducts are slightly more prominent than on the prior study. No obstructing lesion is evident. Pancreas: Unremarkable. No pancreatic ductal dilatation or surrounding inflammatory changes. Spleen: Normal in size without focal abnormality. Adrenals/Urinary Tract: Adrenal glands are normal bilaterally. Bilateral percutaneous nephrostomy  catheters are in place. The collecting systems are decompressed. There is no stone or mass lesion in either kidney. Ureters are within normal limits. A Foley catheter is present in the urinary bladder. Stomach/Bowel: Stomach is within normal limits. Duodenum is unremarkable. Dilated loops of small bowel extend into the left upper quadrant. There are multiple small ventral hernias. The transition point appears to be just below the umbilicus. The obstruction is most likely due to adhesions. Although there is a small ventral hernia, no definite strangulated bowel is evident. A loops of more distal decompressed bowel extends into a more superior left ventral hernia. This is chronic. The ostomy is decompressed. Residual colon is unremarkable. Vascular/Lymphatic: Presacral soft tissue is stable. Retroperitoneal dissection is noted. No new adenopathy is present. Reproductive: Postsurgical changes are noted. Other: No free fluid is present.  There is no free air. Musculoskeletal: Vertebral body heights and alignment are maintained. Slight degenerative anterolisthesis at L3-4 is again noted. There straightening of the normal lumbar lordosis. Bony pelvis is within normal limits. The hips are located and normal. IMPRESSION: 1. Small bowel obstruction. Transition is near the umbilicus, likely related to adhesions. No definite strangulated bowel is present. 2. Decompressed small bowel extends into a separate ventral hernia more superiorly and to the left. 3. Ostomy is decompressed. 4. Bilateral percutaneous nephrostomy drains in place. Collecting systems are decompressed. 5. Foley catheter in place. 6. Stable postsurgical changes of the pelvis. These results were called by telephone at the time of interpretation on 01/17/2019 at 4:30 am to [facility] , who verbally acknowledged these results. Electronically Signed   By: San Morelle M.D.   On: 01/17/2019 04:33   Dg Chest Port 1 View  Result Date:  01/19/2019 CLINICAL DATA:  history of endometrial and ovarian cancer status post surgery complicated with infection and presently patient has bilateral nephrostomy ileostomy and Foley catheter was recently treated for UTI with IV antibiotics until yesterday EXAM: PORTABLE CHEST 1 VIEW COMPARISON:  10/04/2018 and 11/29/2017 radiographs. FINDINGS: Enteric tube projects below the diaphragm, tip not visualized. Right arm PICC is unchanged at the mid SVC level. The heart size and mediastinal contours are stable. There is mildly increased opacity at the left lung base, probably atelectasis. No pneumothorax or significant pleural effusion. IMPRESSION: Satisfactory position of the enteric tube. Mildly increased left basilar atelectasis. Electronically Signed   By: Richardean Sale M.D.   On: 01/19/2019 09:24   Dg Abd Portable 1v  Result Date: 01/19/2019 CLINICAL DATA:  Followup small-bowel obstruction. EXAM: PORTABLE ABDOMEN - 1 VIEW COMPARISON:  01/18/2019 FINDINGS: Bilateral nephrostomy tubes are stable. The NG tube is in the fundal region of the stomach and the proximal port is likely at or above the GE junction. Single loop of air distended small  bowel in the left upper quadrant but overall slight improved bowel gas pattern. No free air. IMPRESSION: 1. The NG tube tip has pulled back into the fundal region. 2. Single slightly dilated air-filled loop of small bowel in the left upper quadrant but overall improved bowel gas pattern. Electronically Signed   By: Marijo Sanes M.D.   On: 01/19/2019 14:00   Dg Abd Portable 1v-small Bowel Obstruction Protocol-initial, 8 Hr Delay  Result Date: 01/17/2019 CLINICAL DATA:  Small bowel obstruction EXAM: PORTABLE ABDOMEN - 1 VIEW COMPARISON:  01/17/2019 FINDINGS: Bilateral nephrostomy catheters and NG tube remain in place, unchanged. Dilated small bowel loops noted in the abdomen compatible with small bowel obstruction. No significant change since prior study. Prior  cholecystectomy. No organomegaly or visible free air. IMPRESSION: Stable small bowel obstruction pattern. Electronically Signed   By: Rolm Baptise M.D.   On: 01/17/2019 21:48   Dg Abd Portable 1v-small Bowel Protocol-position Verification  Result Date: 01/17/2019 CLINICAL DATA:  NG tube placement. EXAM: PORTABLE ABDOMEN - 1 VIEW COMPARISON:  One-view abdomen 01/17/2019 FINDINGS: Side port of the NG tube is now at the GE junction. Would advance 5 cm for more optimal positioning. Dilated loops of bowel are again noted in the left upper quadrant. The stomach is collapsed. Nephrostomy tubes are stable. IMPRESSION: 1. The NG tube scratched at the side port of the NG tube is at the GE junction. Would advance 5 cm for more optimal positioning. Electronically Signed   By: San Morelle M.D.   On: 01/17/2019 10:04   Dg Abd Portable 1 View  Result Date: 01/17/2019 CLINICAL DATA:  NG tube placement. EXAM: PORTABLE ABDOMEN - 1 VIEW COMPARISON:  CT of the abdomen and pelvis 01/17/2019 FINDINGS: Right-sided PICC line terminates in the mid SVC. Heart size is exaggerated by low lung volumes.  Lungs are clear. Side port of the NG tube is in the fundus of the stomach. Dilated loops of small bowel are again noted in the left upper quadrant. Bilateral percutaneous nephrostomy tubes are noted. IMPRESSION: 1. Side port of the NG tube is in the fundus the stomach. 2. Persistent dilated small bowel in the left upper quadrant. Electronically Signed   By: San Morelle M.D.   On: 01/17/2019 05:54    Labs:  CBC: Recent Labs    11/29/18 01/05/19 1010 01/17/19 0351 01/19/19 0339  WBC 4.0 4.6 13.7* 4.6  HGB 10.8* 11.1* 11.5* 9.5*  HCT 35* 36.6 37.6 31.7*  PLT 273 259 279 213    COAGS: Recent Labs    01/05/19 1010  INR 0.9    BMP: Recent Labs    10/04/18 1345 11/29/18 01/05/19 1010 01/17/19 0351 01/19/19 0339  NA 136 142 139 141 147*  K 3.6 4.4 4.1 4.0 3.4*  CL 106  --  112* 101 110  CO2 20*   --  21* 28 29  GLUCOSE 132*  --  103* 164* 80  BUN 36* 22* 35* 24* 20  CALCIUM 9.3  --  9.2 9.4 8.9  CREATININE 1.94* 1.3* 1.60* 1.52* 1.25*  GFRNONAA 27*  --  34* 36* 45*  GFRAA 31*  --  39* 42* 53*    LIVER FUNCTION TESTS: Recent Labs    05/19/18 1127 09/28/18 1441 10/04/18 1345 11/29/18 01/17/19 0351  BILITOT 0.4 0.4 0.5  --  0.6  AST 30 16 28  46* 24  ALT 54* 29 55* 70* 53*  ALKPHOS 364* 284* 550* 377* 305*  PROT 8.5* 8.6* 8.4*  --  7.4  ALBUMIN 2.8* 3.2* 3.0*  --  2.9*    Assessment and Plan:  Patient with history of endometrial cancer complication by developed of bilateral ureteral obstruction s/p bilateral nephrostomy tube placements in IR 09/02/2017 by Dr. Earleen Newport (most recently bilaterally exchanged in IR 01/05/2019 by Dr. Pascal Lux). Right tube stable, left tube with possible obstruction in bag tubing (NOT in nephrostomy tube itself- when bag was removed, free flowing urine from nephrostomy tube itself). Discussed with Dr. Anselm Pancoast who recommends bilateral exchange of nephrostomy bags. Bags exchanged at bedside bilaterally, no complications. Please consult IR with further questions/concerns.   Electronically Signed: Earley Abide, PA-C 01/20/2019, 10:40 AM   I spent a total of 35 Minutes at the the patient's bedside AND on the patient's hospital floor or unit, greater than 50% of which was counseling/coordinating care for nephrostomy tube malfunction.

## 2019-01-20 NOTE — Progress Notes (Signed)
Assessment & Plan: SBO - hx recurrent endometrial/ ovarian cancer (ex lap,posterior supralevator pelvic exenteration, end colostomy, bladder repair, ureteral stentingwith complete resection and negative margins on 11/09/14. 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  They will come evaluate.        [name] Gemma, MD       [facility]       Office: [phone]   Chief Complaint: Small bowel obstruction  Subjective: Patient in bed, bathing with nurse.  Complaint about no output from left nephrostomy, leakage.  Moderate output from ileostomy overnight.  Objective: Vital signs in last 24 hours: Temp:  [98.1 F (36.7 C)-98.3 F (36.8 C)] 98.3 F (36.8 C) (06/20 0432) Pulse Rate:  [51-57] 51 (06/20 0432) Resp:  [14] 14 (06/20 0432) BP: (123-131)/(54-72) 131/72 (06/20 0432) SpO2:  [99 %] 99 % (06/20 0432) Last BM Date: 01/19/19  Intake/Output from previous day: 06/19 0701 - 06/20 0700 In: 733.6 [P.O.:120; I.V.:344.1; IV Piggyback:269.5] Out: 2925 [Urine:925; Emesis/NG output:1200; Stool:800] Intake/Output this shift: No intake/output data recorded.  Physical Exam: HEENT - sclerae clear, mucous membranes moist Neck - soft Abdomen - ostomy with moderate succus in bag Ext - no edema, non-tender Neuro - alert & oriented, no focal deficits  Lab Results:  Recent Labs    01/19/19 0339  WBC 4.6  HGB 9.5*  HCT 31.7*  PLT 213   BMET  Recent Labs    01/19/19 0339  NA 147*  K 3.4*  CL 110  CO2 29  GLUCOSE 80  BUN 20  CREATININE 1.25*  CALCIUM 8.9   PT/INR No results for input(s): LABPROT, INR in the last 72 hours. Comprehensive Metabolic Panel:    Component Value Date/Time   NA 147 (H) 01/19/2019 0339   NA 141 01/17/2019 0351   NA 142 11/29/2018   NA 141 01/24/2017 1228   NA 141 10/07/2016 0929   K 3.4 (L) 01/19/2019 0339   K 4.0 01/17/2019 0351   K 4.6 01/24/2017 1228   K 4.5 10/07/2016 0929   CL 110 01/19/2019 0339   CL 101 01/17/2019 0351   CO2 29 01/19/2019 0339   CO2 28 01/17/2019 0351   CO2 29 01/24/2017 1228   CO2 30 (H) 10/07/2016 0929   BUN 20 01/19/2019 0339   BUN 24 (H) 01/17/2019 0351   BUN 22 (A) 11/29/2018   BUN 22.2 01/24/2017 1228   BUN 23.6 10/07/2016 0929   CREATININE 1.25 (H) 01/19/2019 0339   CREATININE 1.52 (H) 01/17/2019 0351   CREATININE 1.98 (H) 03/27/2018 1324   CREATININE 0.8 01/24/2017 1228   CREATININE 1.0 10/07/2016 0929   GLUCOSE 80 01/19/2019 0339   GLUCOSE 164 (H) 01/17/2019 0351   GLUCOSE 84 01/24/2017 1228   GLUCOSE 108 10/07/2016 0929   CALCIUM 8.9 01/19/2019 0339   CALCIUM 9.4 01/17/2019 0351   CALCIUM 10.2 01/24/2017 1228   CALCIUM 10.7 (H) 10/07/2016 0929   AST 24 01/17/2019 0351   AST 46 (A)  11/29/2018   AST 14 (L) 03/27/2018 1324   AST 16 01/24/2017 1228   AST 15 10/07/2016 0929   ALT 53 (H) 01/17/2019 0351   ALT 70 (A) 11/29/2018   ALT 18 03/27/2018 1324   ALT 14 01/24/2017 1228   ALT 16 10/07/2016 0929   ALKPHOS 305 (H) 01/17/2019 0351   ALKPHOS 377 (A) 11/29/2018   ALKPHOS 90 01/24/2017 1228   ALKPHOS 123 10/07/2016 0929   BILITOT 0.6 01/17/2019 0351   BILITOT 0.5 10/04/2018 1345   BILITOT 0.2 (L) 03/27/2018 1324   BILITOT 0.34 01/24/2017 1228   BILITOT 0.31 10/07/2016 0929   PROT 7.4 01/17/2019 0351   PROT 8.4 (H) 10/04/2018 1345   PROT 6.9 01/24/2017 1228   PROT 8.2 10/07/2016 0929   ALBUMIN 2.9 (L) 01/17/2019 0351   ALBUMIN 3.0  (L) 10/04/2018 1345   ALBUMIN 3.4 (L) 01/24/2017 1228   ALBUMIN 3.8 10/07/2016 0929    Studies/Results: Dg Abd 1 View  Result Date: 01/18/2019 CLINICAL DATA:  Small-bowel protocol. 24 hour delay after Gastrografin administration. EXAM: ABDOMEN - 1 VIEW COMPARISON:  01/17/2019 FINDINGS: Stable position of the bilateral nephrostomy tubes. Nasogastric tube in region of the stomach body. There continues to be gas-filled loops of small bowel in the mid abdomen. Oral contrast is not well demonstrated on this examination. Again noted are multiple surgical clips in the abdominopelvic region. Limited evaluation for free air on this supine image. IMPRESSION: Persistent gas-filled loops of bowel in the central abdomen. Bowel gas is less obvious on this examination and may be related to recent Gastrografin administration. Findings remain compatible with a small bowel obstruction. Electronically Signed   By: Markus Daft M.D.   On: 01/18/2019 12:04   Dg Chest Port 1 View  Result Date: 01/19/2019 CLINICAL DATA:  history of endometrial and ovarian cancer status post surgery complicated with infection and presently patient has bilateral nephrostomy ileostomy and Foley catheter was recently treated for UTI with IV antibiotics until yesterday EXAM: PORTABLE CHEST 1 VIEW COMPARISON:  10/04/2018 and 11/29/2017 radiographs. FINDINGS: Enteric tube projects below the diaphragm, tip not visualized. Right arm PICC is unchanged at the mid SVC level. The heart size and mediastinal contours are stable. There is mildly increased opacity at the left lung base, probably atelectasis. No pneumothorax or significant pleural effusion. IMPRESSION: Satisfactory position of the enteric tube. Mildly increased left basilar atelectasis. Electronically Signed   By: Richardean Sale M.D.   On: 01/19/2019 09:24   Dg Abd Portable 1v  Result Date: 01/19/2019 CLINICAL DATA:  Followup small-bowel obstruction. EXAM: PORTABLE ABDOMEN - 1 VIEW  COMPARISON:  01/18/2019 FINDINGS: Bilateral nephrostomy tubes are stable. The NG tube is in the fundal region of the stomach and the proximal port is likely at or above the GE junction. Single loop of air distended small bowel in the left upper quadrant but overall slight improved bowel gas pattern. No free air. IMPRESSION: 1. The NG tube tip has pulled back into the fundal region. 2. Single slightly dilated air-filled loop of small bowel in the left upper quadrant but overall improved bowel gas pattern. Electronically Signed   By: Marijo Sanes M.D.   On: 01/19/2019 14:00      [name] 01/20/2019  Patient ID: [name], female   DOB: [birth year], 63 y.o.   MRN: [id number]

## 2019-01-21 ENCOUNTER — Inpatient Hospital Stay (HOSPITAL_COMMUNITY): Payer: BC Managed Care – PPO

## 2019-01-21 LAB — GLUCOSE, CAPILLARY
Glucose-Capillary: 82 mg/dL (ref 70–99)
Glucose-Capillary: 94 mg/dL (ref 70–99)
Glucose-Capillary: 160 mg/dL — ABNORMAL HIGH (ref 70–99)
Glucose-Capillary: 74 mg/dL (ref 70–99)
Glucose-Capillary: 130 mg/dL — ABNORMAL HIGH (ref 70–99)

## 2019-01-21 MED ORDER — TBO-FILGRASTIM 480 MCG/0.8ML ~~LOC~~ SOSY
480.0000 ug | PREFILLED_SYRINGE | Freq: Once | SUBCUTANEOUS | Status: AC
  Administered 2019-01-21: 480 ug via SUBCUTANEOUS
  Filled 2019-01-21: qty 0.8

## 2019-01-21 NOTE — Plan of Care (Signed)
  Problem: Education: Goal: Knowledge of General Education information will improve Description: Including pain rating scale, medication(s)/side effects and non-pharmacologic comfort measures 01/21/2019 0345 by Claire Shown, RN Outcome: Progressing 01/21/2019 0344 by Claire Shown, RN Outcome: Progressing   Problem: Clinical Measurements: Goal: Ability to maintain clinical measurements within normal limits will improve 01/21/2019 0345 by Claire Shown, RN Outcome: Progressing 01/21/2019 0344 by Claire Shown, RN Outcome: Progressing   Problem: Clinical Measurements: Goal: Respiratory complications will improve Outcome: Progressing   Problem: Pain Managment: Goal: General experience of comfort will improve Outcome: Progressing   Problem: Safety: Goal: Ability to remain free from injury will improve Outcome: Progressing   Problem: Skin Integrity: Goal: Risk for impaired skin integrity will decrease Outcome: Progressing

## 2019-01-21 NOTE — Progress Notes (Signed)
Assessment & Plan: SBO - hx recurrent endometrial/ ovarian cancer (ex lap,posterior supralevator pelvic exenteration, end colostomy, bladder repair, ureteral stentingwith complete resection and negative margins on 11/09/14. XI ROBOTIC LYSIS OF ADHESIONSx 5 hours XI ROBOTICCOLOSTOMY TAKEDOWN ROBOTIC SEWN COLOANAL ANASTOMOSIS Ileocecal resection with anastomosis Diverting loop ileostomy8/31/18, SG; S/p adjuvant radiation completed 03/10/15; chemotherapy  FEN: NPO/IV fluids - advance to clear/full liquid diet today DVT: SCD's  Plan: 1. AXR improved - will remove NG this AM and allow clear/full liquid diet today 2. IR evaluated perc neph tubes and is following 3. May have Rx for neutropenia as far as surgery is concerned  Will continue to follow with you.        Armandina Gemma, MD       [facility]       Office: [phone]   Chief Complaint: SBO  Subjective: Patient up in chair.  Tolerated clamped NG for 24 hours with clear liquid diet.  Denies nausea or emesis.  Objective: Vital signs in last 24 hours: Temp:  [98.1 F (36.7 C)-98.3 F (36.8 C)] 98.3 F (36.8 C) (06/21 0406) Pulse Rate:  [56-60] 56 (06/21 0406) Resp:  [16-18] 18 (06/21 0406) BP: (124-142)/(58-71) 142/71 (06/21 0406) SpO2:  [100 %] 100 % (06/21 0406) Weight:  [92.6 kg] 92.6 kg (06/21 0406) Last BM Date: 01/21/19(Liquid from ileostomy)  Intake/Output from previous day: 06/20 0701 - 06/21 0700 In: 2054.1 [I.V.:2054.1] Out: 1565 [Urine:465; Stool:1100] Intake/Output this shift: Total I/O In: 0  Out: 425 [Urine:175; Stool:250]  Physical Exam: HEENT - sclerae clear, mucous membranes moist Neck - soft Chest - clear bilaterally Cor - RRR Abdomen - soft without distension; non-tender; small succus in ostomy bag Ext - no edema, non-tender Neuro - alert & oriented, no focal deficits  Lab Results:  Recent Labs    01/19/19 0339  WBC 4.6  HGB 9.5*  HCT 31.7*  PLT  213   BMET Recent Labs    01/19/19 0339  NA 147*  K 3.4*  CL 110  CO2 29  GLUCOSE 80  BUN 20  CREATININE 1.25*  CALCIUM 8.9   PT/INR No results for input(s): LABPROT, INR in the last 72 hours. Comprehensive Metabolic Panel:    Component Value Date/Time   NA 147 (H) 01/19/2019 0339   NA 141 01/17/2019 0351   NA 142 11/29/2018   NA 141 01/24/2017 1228   NA 141 10/07/2016 0929   K 3.4 (L) 01/19/2019 0339   K 4.0 01/17/2019 0351   K 4.6 01/24/2017 1228   K 4.5 10/07/2016 0929   CL 110 01/19/2019 0339   CL 101 01/17/2019 0351   CO2 29 01/19/2019 0339   CO2 28 01/17/2019 0351   CO2 29 01/24/2017 1228   CO2 30 (H) 10/07/2016 0929   BUN 20 01/19/2019 0339   BUN 24 (H) 01/17/2019 0351   BUN 22 (A) 11/29/2018   BUN 22.2 01/24/2017 1228   BUN 23.6 10/07/2016 0929   CREATININE 1.25 (H) 01/19/2019 0339   CREATININE 1.52 (H) 01/17/2019 0351   CREATININE 1.98 (H) 03/27/2018 1324   CREATININE 0.8 01/24/2017 1228   CREATININE 1.0 10/07/2016 0929   GLUCOSE 80 01/19/2019 0339   GLUCOSE 164 (H) 01/17/2019 0351   GLUCOSE 84 01/24/2017 1228   GLUCOSE 108 10/07/2016 0929   CALCIUM 8.9 01/19/2019 0339   CALCIUM 9.4 01/17/2019 0351   CALCIUM 10.2 01/24/2017 1228   CALCIUM 10.7 (H) 10/07/2016 0929   AST 24 01/17/2019  0351   AST 46 (A) 11/29/2018   AST 14 (L) 03/27/2018 1324   AST 16 01/24/2017 1228   AST 15 10/07/2016 0929   ALT 53 (H) 01/17/2019 0351   ALT 70 (A) 11/29/2018   ALT 18 03/27/2018 1324   ALT 14 01/24/2017 1228   ALT 16 10/07/2016 0929   ALKPHOS 305 (H) 01/17/2019 0351   ALKPHOS 377 (A) 11/29/2018   ALKPHOS 90 01/24/2017 1228   ALKPHOS 123 10/07/2016 0929   BILITOT 0.6 01/17/2019 0351   BILITOT 0.5 10/04/2018 1345   BILITOT 0.2 (L) 03/27/2018 1324   BILITOT 0.34 01/24/2017 1228   BILITOT 0.31 10/07/2016 0929   PROT 7.4 01/17/2019 0351   PROT 8.4 (H) 10/04/2018 1345   PROT 6.9 01/24/2017 1228   PROT 8.2 10/07/2016 0929   ALBUMIN 2.9 (L) 01/17/2019 0351    ALBUMIN 3.0 (L) 10/04/2018 1345   ALBUMIN 3.4 (L) 01/24/2017 1228   ALBUMIN 3.8 10/07/2016 0929    Studies/Results: Dg Abd Portable 1v  Result Date: 01/20/2019 CLINICAL DATA:  Small bowel obstruction. EXAM: PORTABLE ABDOMEN - 1 VIEW COMPARISON:  01/19/2019 FINDINGS: Relative paucity of bowel gas. Persistent mildly dilated small bowel loop in the left upper quadrant of the abdomen measures 3.3 cm transversely. Enteric catheter overlies the expected location of the stomach. Bilateral PICC tail catheters are stable. Postsurgical changes in the pelvis. IMPRESSION: 1. Relative paucity of bowel gas. 2. Persistent mildly dilated small bowel loop in the left upper quadrant of the abdomen. Electronically Signed   By: Fidela Salisbury M.D.   On: 01/20/2019 15:29   Dg Abd Portable 1v  Result Date: 01/19/2019 CLINICAL DATA:  Followup small-bowel obstruction. EXAM: PORTABLE ABDOMEN - 1 VIEW COMPARISON:  01/18/2019 FINDINGS: Bilateral nephrostomy tubes are stable. The NG tube is in the fundal region of the stomach and the proximal port is likely at or above the GE junction. Single loop of air distended small bowel in the left upper quadrant but overall slight improved bowel gas pattern. No free air. IMPRESSION: 1. The NG tube tip has pulled back into the fundal region. 2. Single slightly dilated air-filled loop of small bowel in the left upper quadrant but overall improved bowel gas pattern. Electronically Signed   By: Marijo Sanes M.D.   On: 01/19/2019 14:00      Armandina Gemma 01/21/2019  Patient ID: [name], female   DOB: 07/22/1956, 63 y.o.   MRN: [id number]

## 2019-01-21 NOTE — Progress Notes (Signed)
PROGRESS NOTE    [name]  JSH:[id number] DOB: 1956/03/08 DOA: 01/17/2019 PCP: Ann Held, DO  Outpatient Specialists:    Brief Narrative:  As per H and P "[name] is a 63 y.o. female with history of endometrial and ovarian cancer status post surgery complicated with infection and presently patient has bilateral nephrostomy ileostomy and Foley catheter was recently treated for UTI with IV antibiotics until yesterday presents to the ER with complaint of persistent abdominal pain diffuse in nature with multiple episodes of nausea vomiting.  Pain has been going on for last 2 days.  Denies chest pain or shortness of breath fever chills or productive cough.  ED Course: In the ER patient had a CT abdomen pelvis which shows small bowel obstruction with transition point.  On-call general surgeon was consulted patient has NG tube placed and admitted for further management of small bowel obstruction.  Labs revealed elevated creatinine at baseline of 1.5 mild anemia".  01/18/2019: SBO persists.  Surgical input is appreciated.  NG tube is still draining a lot of secretions.  01/19/2019: Patient seen.  Feels better today.  Surgery and pruritus appreciated.  SBO persists.  Continue conservative management for now.  01/20/2019: Patient seen.  Input from interventional radiology team is appreciated (kindly refer to diagnosis).  The patient was initially concerned that the left nephrostomy tube was blocked.  Interventional radiology team is monitoring.  Results of repeat abdominal x-ray is pending.  However, patient reports that there has been increased output into the ileostomy bag.  Patient also feels better.  Continue to manage SBO expectantly.  01/21/2019: Patient seen.  Patient continues to improve.  NG tube had been removed.  Repeat abdominal x-ray reviewed.  Diet is been advanced.  Hopefully, patient will be discharged back on tomorrow.  Assessment & Plan:   Principal Problem:   SBO  (small bowel obstruction) (HCC) Active Problems:   Primary hypothyroidism   Benign essential HTN   Type II diabetes mellitus with renal manifestations (HCC)    Small bowel obstruction: Continue NG tube to low suction Surgical input is appreciated. Supportive care. Ensure adequate hydration. Optimize electrolytes.  01/20/2019: Continue conservative management.  Seems to be improving. 01/21/2019: This continues to resolve.  Hypothyroidism: Continue Synthroid.    Diabetes mellitus type 2: Accu-Cheks. Sliding scale insulin coverage.  Chronic kidney disease stage III Creatinine appears to be at baseline follow metabolic panel. Continue to monitor closely.  Anemia: Stable. Continue to monitor.  Hypertension: Continue to optimize.   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  Nephrostomy tube:  This seems to be draining a lot better according to the patient.  Continue to monitor.    DVT prophylaxis: SCDs in anticipation of possible procedure. Code Status: Full code. Family Communication: Discussed with patient. Disposition Plan: Home. Consults called: General surgery.   Procedures:  NG-tube removed  Antimicrobials:   None   Subjective: No new complaints Symptoms are imroving. Tolerating diet.  Objective: Vitals:   01/20/19 0432 01/20/19 1438 01/20/19 2006 01/21/19 0406  BP: 131/72 (!) 124/58 139/65 (!) 142/71  Pulse: (!) 51 (!) 59 60 (!) 56  Resp: 14  16 18   Temp: 98.3 F (36.8 C) 98.3 F (36.8 C) 98.1 F (36.7 C) 98.3 F (36.8 C)  TempSrc: Oral Oral Oral Oral  SpO2: 99% 100% 100%  100%  Weight:    92.6 kg  Height:        Intake/Output Summary (Last 24 hours) at 01/21/2019 1127 Last data filed at 01/21/2019 0817 Gross per 24 hour  Intake  2054.12 ml  Output 1990 ml  Net 64.12 ml   Filed Weights   01/17/19 0608 01/19/19 0615 01/21/19 0406  Weight: 90.7 kg 94.7 kg 92.6 kg    Examination:  General exam: Appears calm and comfortable.  Bilateral nephrostomy tube is in place.  Ileostomy noted. Respiratory system: Clear to auscultation.  Cardiovascular system: S1 & S2  Gastrointestinal system: Abdomen is nondistended, soft and nontender.  Central nervous system: Alert and oriented. No focal neurological deficits. Extremities: Symmetric 5 x 5 power.  Data Reviewed: I have personally reviewed following labs and imaging studies  CBC: Recent Labs  Lab 01/17/19 0351 01/19/19 0339  WBC 13.7* 4.6  NEUTROABS 11.7*  --   HGB 11.5* 9.5*  HCT 37.6 31.7*  MCV 96.2 98.8  PLT 279 604   Basic Metabolic Panel: Recent Labs  Lab 01/17/19 0351 01/19/19 0339  NA 141 147*  K 4.0 3.4*  CL 101 110  CO2 28 29  GLUCOSE 164* 80  BUN 24* 20  CREATININE 1.52* 1.25*  CALCIUM 9.4 8.9  MG  --  1.9   GFR: Estimated Creatinine Clearance: 48.7 mL/min (A) (by C-G formula based on SCr of 1.25 mg/dL (H)). Liver Function Tests: Recent Labs  Lab 01/17/19 0351  AST 24  ALT 53*  ALKPHOS 305*  BILITOT 0.6  PROT 7.4  ALBUMIN 2.9*   Recent Labs  Lab 01/17/19 0351  LIPASE 27   No results for input(s): AMMONIA in the last 168 hours. Coagulation Profile: No results for input(s): INR, PROTIME in the last 168 hours. Cardiac Enzymes: No results for input(s): CKTOTAL, CKMB, CKMBINDEX, TROPONINI in the last 168 hours. BNP (last 3 results) No results for input(s): PROBNP in the last 8760 hours. HbA1C: No results for input(s): HGBA1C in the last 72 hours. CBG: Recent Labs  Lab 01/20/19 1613 01/20/19 2002 01/20/19 2349 01/21/19 0403 01/21/19 0823  GLUCAP 76 119* 98 82 94   Lipid Profile: No results for input(s): CHOL, HDL, LDLCALC, TRIG, CHOLHDL, LDLDIRECT in the last 72 hours. Thyroid Function Tests: No results for input(s):  TSH, T4TOTAL, FREET4, T3FREE, THYROIDAB in the last 72 hours. Anemia Panel: No results for input(s): VITAMINB12, FOLATE, FERRITIN, TIBC, IRON, RETICCTPCT in the last 72 hours. Urine analysis:    Component Value Date/Time   COLORURINE YELLOW 10/04/2018 1320   APPEARANCEUR HAZY (A) 10/04/2018 1320   LABSPEC 1.017 10/04/2018 1320   LABSPEC 1.015 11/17/2015 1436   PHURINE 6.0 10/04/2018 1320   GLUCOSEU NEGATIVE 10/04/2018 1320   GLUCOSEU 2,000 11/17/2015 1436   HGBUR LARGE (A) 10/04/2018 1320   BILIRUBINUR NEGATIVE 10/04/2018 1320   BILIRUBINUR neg 08/22/2018 1409   BILIRUBINUR Negative 11/17/2015 1436   KETONESUR NEGATIVE 10/04/2018 1320   PROTEINUR 100 (A) 10/04/2018 1320   UROBILINOGEN 0.2 08/22/2018 1409   UROBILINOGEN 0.2 11/17/2015 1436   NITRITE NEGATIVE 10/04/2018 1320   LEUKOCYTESUR MODERATE (A) 10/04/2018 1320   LEUKOCYTESUR Moderate 11/17/2015 1436   Sepsis Labs: @LABRCNTIP (procalcitonin:4,lacticidven:4)  ) Recent Results (from the past 240 hour(s))  Novel Coronavirus,NAA,(SEND-OUT TO REF LAB - TAT 24-48 hrs); Hosp Order     Status: None   Collection Time: 01/17/19  4:47 AM   Specimen: Nasopharyngeal Swab; Respiratory  Result Value Ref Range Status   SARS-CoV-2, NAA NOT  DETECTED NOT DETECTED Final    Comment: (NOTE) This test was developed and its performance characteristics determined by [employer]. This test has not been FDA cleared or approved. This test has been authorized by FDA under an Emergency Use Authorization (EUA). This test is only authorized for the duration of time the declaration that circumstances exist justifying the authorization of the emergency use of in vitro diagnostic tests for detection of SARS-CoV-2 virus and/or diagnosis of COVID-19 infection under section 564(b)(1) of the Act, 21 U.S.C. 032ZYY-4(M)(2), unless the authorization is terminated or revoked sooner. When diagnostic testing is negative, the possibility of a false  negative result should be considered in the context of a patient's recent exposures and the presence of clinical signs and symptoms consistent with COVID-19. An individual without symptoms of COVID-19 and who is not shedding SARS-CoV-2 virus would expect to have a negative (not detected) result in this assay. Performed  At: [facility] [address] [place], [place] [id number] Rush Farmer MD QB:[id number]    Justice  Final    Comment: Performed at [facility], [place] [address] [place], [place] [address]         Radiology Studies: Dg Abd 2 Views  Result Date: 01/21/2019 CLINICAL DATA:  Small bowel obstruction. EXAM: ABDOMEN - 2 VIEW COMPARISON:  01/20/2019 FINDINGS: Nasogastric tube unchanged with tip over the stomach in the left upper quadrant. Bilateral percutaneous nephrostomy tubes unchanged. Single air-filled dilated small bowel loop in the left lower quadrant. Relative paucity of bowel gas throughout the remainder of the abdomen. No free peritoneal air. Multiple surgical clips over the midline lower abdomen/pelvis. Surgical clips in the right upper quadrant. Remainder the exam is unchanged. IMPRESSION: Persistent single air-filled mildly dilated small bowel loop in the left lower quadrant. Multiple tubes and lines as described. Electronically Signed   By: Marin Olp M.D.   On: 01/21/2019 10:42   Dg Abd Portable 1v  Result Date: 01/20/2019 CLINICAL DATA:  Small bowel obstruction. EXAM: PORTABLE ABDOMEN - 1 VIEW COMPARISON:  01/19/2019 FINDINGS: Relative paucity of bowel gas. Persistent mildly dilated small bowel loop in the left upper quadrant of the abdomen measures 3.3 cm transversely. Enteric catheter overlies the expected location of the stomach. Bilateral PICC tail catheters are stable. Postsurgical changes in the pelvis. IMPRESSION: 1. Relative paucity of bowel gas. 2. Persistent mildly dilated small bowel loop in the left upper  quadrant of the abdomen. Electronically Signed   By: Fidela Salisbury M.D.   On: 01/20/2019 15:29   Dg Abd Portable 1v  Result Date: 01/19/2019 CLINICAL DATA:  Followup small-bowel obstruction. EXAM: PORTABLE ABDOMEN - 1 VIEW COMPARISON:  01/18/2019 FINDINGS: Bilateral nephrostomy tubes are stable. The NG tube is in the fundal region of the stomach and the proximal port is likely at or above the GE junction. Single loop of air distended small bowel in the left upper quadrant but overall slight improved bowel gas pattern. No free air. IMPRESSION: 1. The NG tube tip has pulled back into the fundal region. 2. Single slightly dilated air-filled loop of small bowel in the left upper quadrant but overall improved bowel gas pattern. Electronically Signed   By: Marijo Sanes M.D.   On: 01/19/2019 14:00        Scheduled Meds: . fentaNYL  1 patch Transdermal Q72H  . insulin aspart  0-9 Units Subcutaneous TID WC  . levothyroxine  75 mcg Intravenous QAC breakfast   Continuous Infusions: . dextrose 5 %  and 0.9% NaCl 50 mL/hr at 01/21/19 0859     LOS: 4 days    Time spent: 25 Minutes    Dana Allan, MD  [facility] Pager #: [phone] 7PM-7AM contact night coverage as above

## 2019-01-22 DIAGNOSIS — E785 Hyperlipidemia, unspecified: Secondary | ICD-10-CM | POA: Diagnosis not present

## 2019-01-22 DIAGNOSIS — L89309 Pressure ulcer of unspecified buttock, unspecified stage: Secondary | ICD-10-CM | POA: Diagnosis not present

## 2019-01-22 DIAGNOSIS — E860 Dehydration: Secondary | ICD-10-CM | POA: Diagnosis not present

## 2019-01-22 LAB — BASIC METABOLIC PANEL
Anion gap: 7 (ref 5–15)
BUN: 13 mg/dL (ref 8–23)
CO2: 25 mmol/L (ref 22–32)
Calcium: 8.9 mg/dL (ref 8.9–10.3)
Chloride: 106 mmol/L (ref 98–111)
Creatinine, Ser: 1.35 mg/dL — ABNORMAL HIGH (ref 0.44–1.00)
GFR calc Af Amer: 48 mL/min — ABNORMAL LOW (ref >60)
GFR calc non Af Amer: 41 mL/min — ABNORMAL LOW (ref >60)
Glucose, Bld: 110 mg/dL — ABNORMAL HIGH (ref 70–99)
Potassium: 4.3 mmol/L (ref 3.5–5.1)
Sodium: 138 mmol/L (ref 135–145)

## 2019-01-22 LAB — CBC WITH DIFFERENTIAL/PLATELET
Abs Immature Granulocytes: 0.78 10*3/uL — ABNORMAL HIGH (ref 0.00–0.07)
Basophils Absolute: 0.1 10*3/uL (ref 0.0–0.1)
Basophils Relative: 0 %
Eosinophils Absolute: 0.3 10*3/uL (ref 0.0–0.5)
Eosinophils Relative: 1 %
HCT: 30 % — ABNORMAL LOW (ref 36.0–46.0)
Hemoglobin: 9.3 g/dL — ABNORMAL LOW (ref 12.0–15.0)
Immature Granulocytes: 4 %
Lymphocytes Relative: 3 %
Lymphs Abs: 0.7 10*3/uL (ref 0.7–4.0)
MCH: 29.8 pg (ref 26.0–34.0)
MCHC: 31 g/dL (ref 30.0–36.0)
MCV: 96.2 fL (ref 80.0–100.0)
Monocytes Absolute: 0.6 10*3/uL (ref 0.1–1.0)
Monocytes Relative: 3 %
Neutro Abs: 18.5 10*3/uL — ABNORMAL HIGH (ref 1.7–7.7)
Neutrophils Relative %: 89 %
Platelets: 193 10*3/uL (ref 150–400)
RBC: 3.12 MIL/uL — ABNORMAL LOW (ref 3.87–5.11)
RDW: 14.1 % (ref 11.5–15.5)
WBC: 21 10*3/uL — ABNORMAL HIGH (ref 4.0–10.5)
nRBC: 0 % (ref 0.0–0.2)

## 2019-01-22 LAB — MAGNESIUM: Magnesium: 1.3 mg/dL — ABNORMAL LOW (ref 1.7–2.4)

## 2019-01-22 LAB — RENAL FUNCTION PANEL
Albumin: 2.4 g/dL — ABNORMAL LOW (ref 3.5–5.0)
Anion gap: 10 (ref 5–15)
BUN: 13 mg/dL (ref 8–23)
CO2: 25 mmol/L (ref 22–32)
Calcium: 8.4 mg/dL — ABNORMAL LOW (ref 8.9–10.3)
Chloride: 107 mmol/L (ref 98–111)
Creatinine, Ser: 1.27 mg/dL — ABNORMAL HIGH (ref 0.44–1.00)
GFR calc Af Amer: 52 mL/min — ABNORMAL LOW (ref >60)
GFR calc non Af Amer: 45 mL/min — ABNORMAL LOW (ref >60)
Glucose, Bld: 81 mg/dL (ref 70–99)
Phosphorus: 3.4 mg/dL (ref 2.5–4.6)
Potassium: 2.9 mmol/L — ABNORMAL LOW (ref 3.5–5.1)
Sodium: 142 mmol/L (ref 135–145)

## 2019-01-22 LAB — GLUCOSE, CAPILLARY
Glucose-Capillary: 102 mg/dL — ABNORMAL HIGH (ref 70–99)
Glucose-Capillary: 76 mg/dL (ref 70–99)
Glucose-Capillary: 84 mg/dL (ref 70–99)
Glucose-Capillary: 87 mg/dL (ref 70–99)
Glucose-Capillary: 98 mg/dL (ref 70–99)

## 2019-01-22 MED ORDER — POTASSIUM CHLORIDE 10 MEQ/100ML IV SOLN
10.0000 meq | INTRAVENOUS | Status: AC
  Administered 2019-01-22 (×3): 10 meq via INTRAVENOUS
  Filled 2019-01-22 (×3): qty 100

## 2019-01-22 MED ORDER — MAGNESIUM SULFATE 50 % IJ SOLN: 2.0000 g | Freq: Once | INTRAMUSCULAR | Status: DC

## 2019-01-22 MED ORDER — POTASSIUM CHLORIDE 10 MEQ/100ML IV SOLN
10.0000 meq | INTRAVENOUS | Status: DC
  Administered 2019-01-22: 10 meq via INTRAVENOUS
  Filled 2019-01-22: qty 100

## 2019-01-22 MED ORDER — MAGNESIUM SULFATE 2 GM/50ML IV SOLN
2.0000 g | Freq: Once | INTRAVENOUS | Status: DC
  Filled 2019-01-22: qty 50

## 2019-01-22 MED ORDER — HEPARIN SOD (PORK) LOCK FLUSH 100 UNIT/ML IV SOLN
250.0000 [IU] | INTRAVENOUS | Status: AC | PRN
  Administered 2019-01-22: 250 [IU]

## 2019-01-22 MED ORDER — POTASSIUM CHLORIDE 20 MEQ PO PACK
20.0000 meq | PACK | Freq: Two times a day (BID) | ORAL | Status: DC
  Administered 2019-01-22: 20 meq via ORAL
  Filled 2019-01-22: qty 1

## 2019-01-22 MED ORDER — HEPARIN SOD (PORK) LOCK FLUSH 100 UNIT/ML IV SOLN: 500.0000 [IU] | INTRAVENOUS | Status: DC | PRN

## 2019-01-22 MED ORDER — POTASSIUM CHLORIDE 10 MEQ/100ML IV SOLN
10.0000 meq | INTRAVENOUS | Status: AC
  Administered 2019-01-22 (×2): 10 meq via INTRAVENOUS
  Filled 2019-01-22 (×2): qty 100

## 2019-01-22 MED ORDER — MAGNESIUM SULFATE IN D5W 1-5 GM/100ML-% IV SOLN
1.0000 g | Freq: Once | INTRAVENOUS | Status: AC
  Administered 2019-01-22: 1 g via INTRAVENOUS
  Filled 2019-01-22: qty 100

## 2019-01-22 MED ORDER — MAGNESIUM SULFATE 2 GM/50ML IV SOLN
2.0000 g | Freq: Once | INTRAVENOUS | Status: AC
  Administered 2019-01-22: 2 g via INTRAVENOUS
  Filled 2019-01-22: qty 50

## 2019-01-22 NOTE — Progress Notes (Signed)
[facility] Progress Note     Subjective: CC: wants to go home Patient is tolerating FLD and having output from ileostomy. Pain is much improved, denies nausea or vomiting. Patient planning to have home health tomorrow to replace magnesium as well, states her baseline is around 1.3-1.4.   Objective: Vital signs in last 24 hours: Temp:  [97.9 F (36.6 C)-98.2 F (36.8 C)] 97.9 F (36.6 C) (06/21 2007) Pulse Rate:  [61] 61 (06/21 2007) Resp:  [16-18] 16 (06/21 2007) BP: (129-157)/(66-86) 129/66 (06/21 2007) SpO2:  [100 %] 100 % (06/21 2007) Last BM Date: 01/21/19  Intake/Output from previous day: 06/21 0701 - 06/22 0700 In: 684 [P.O.:684] Out: 3460 [Urine:1635; Emesis/NG output:575; GURKY:7062] Intake/Output this shift: Total I/O In: -  Out: 825 [Urine:550; Stool:275]  PE: Gen:  Alert, NAD, pleasant Card:  Regular rate and rhythm, pedal pulses 2+ BL Pulm:  Normal effort, clear to auscultation bilaterally Abd: Soft, non-tender, non-distended, +BS, ileostomy working  Skin: warm and dry, no rashes  Psych: A&Ox3   Lab Results:  Recent Labs    01/22/19 0343  WBC 21.0*  HGB 9.3*  HCT 30.0*  PLT 193   BMET Recent Labs    01/22/19 0343  NA 142  K 2.9*  CL 107  CO2 25  GLUCOSE 81  BUN 13  CREATININE 1.27*  CALCIUM 8.4*   PT/INR No results for input(s): LABPROT, INR in the last 72 hours. CMP     Component Value Date/Time   NA 142 01/22/2019 0343   NA 142 11/29/2018   NA 141 01/24/2017 1228   K 2.9 (L) 01/22/2019 0343   K 4.6 01/24/2017 1228   CL 107 01/22/2019 0343   CO2 25 01/22/2019 0343   CO2 29 01/24/2017 1228   GLUCOSE 81 01/22/2019 0343   GLUCOSE 84 01/24/2017 1228   BUN 13 01/22/2019 0343   BUN 22 (A) 11/29/2018   BUN 22.2 01/24/2017 1228   CREATININE 1.27 (H) 01/22/2019 0343   CREATININE 1.98 (H) 03/27/2018 1324   CREATININE 0.8 01/24/2017 1228   CALCIUM 8.4 (L) 01/22/2019 0343   CALCIUM 10.2 01/24/2017 1228   PROT 7.4  01/17/2019 0351   PROT 6.9 01/24/2017 1228   ALBUMIN 2.4 (L) 01/22/2019 0343   ALBUMIN 3.4 (L) 01/24/2017 1228   AST 24 01/17/2019 0351   AST 14 (L) 03/27/2018 1324   AST 16 01/24/2017 1228   ALT 53 (H) 01/17/2019 0351   ALT 18 03/27/2018 1324   ALT 14 01/24/2017 1228   ALKPHOS 305 (H) 01/17/2019 0351   ALKPHOS 90 01/24/2017 1228   BILITOT 0.6 01/17/2019 0351   BILITOT 0.2 (L) 03/27/2018 1324   BILITOT 0.34 01/24/2017 1228   GFRNONAA 45 (L) 01/22/2019 0343   GFRNONAA 26 (L) 03/27/2018 1324   GFRNONAA 78 12/16/2014 1530   GFRAA 52 (L) 01/22/2019 0343   GFRAA 30 (L) 03/27/2018 1324   GFRAA >89 12/16/2014 1530   Lipase     Component Value Date/Time   LIPASE 27 01/17/2019 0351       Studies/Results: Dg Abd 2 Views  Result Date: 01/21/2019 CLINICAL DATA:  Small bowel obstruction. EXAM: ABDOMEN - 2 VIEW COMPARISON:  01/20/2019 FINDINGS: Nasogastric tube unchanged with tip over the stomach in the left upper quadrant. Bilateral percutaneous nephrostomy tubes unchanged. Single air-filled dilated small bowel loop in the left lower quadrant. Relative paucity of bowel gas throughout the remainder of the abdomen. No free peritoneal air. Multiple surgical clips over the  midline lower abdomen/pelvis. Surgical clips in the right upper quadrant. Remainder the exam is unchanged. IMPRESSION: Persistent single air-filled mildly dilated small bowel loop in the left lower quadrant. Multiple tubes and lines as described. Electronically Signed   By: Marin Olp M.D.   On: 01/21/2019 10:42    Anti-infectives: Anti-infectives (From admission, onward)   None       Assessment/Plan Hypertension Type II diabetes Hx Breast cancer Hx endometrail ovarian cancer Ureteral stricture hx/ureteral stent _ Dr. Clint Lipps Chronic pain - fentanyl/Norco/Lyrica CKD   SBO - hx recurrent endometrial/ ovarian cancer (ex lap,posterior supralevator pelvic exenteration, end colostomy, bladder repair, ureteral  stentingwith complete resection and negative margins on 11/09/14. XI ROBOTIC LYSIS OF ADHESIONSx 5 hours XI ROBOTICCOLOSTOMY TAKEDOWN ROBOTIC SEWN COLOANAL ANASTOMOSIS Ileocecal resection with anastomosis Diverting loop ileostomy 04/01/17, SG; S/p adjuvant radiation completed 03/10/15; chemotherapy   FEN: soft diet; replacing magnesium and potassium DVT: SCD's  ID: None Follow up: primary care and surgeon at [place]: Patient tolerating FLD and having ileostomy output. Stable for discharge home from a surgical perspective and follow up with [facility] surgeon as planned.    LOS: 5 days    Brigid Re , [facility] 01/22/2019, 9:13 AM Pager: [phone] Consults: [phone]

## 2019-01-22 NOTE — Progress Notes (Signed)
Supervising Physician: Arne Cleveland  Patient Status:  [facility] - In-pt  Subjective: Patient resting comfortably in chair this AM.  States she his feeling much better.  Has had increased urine output since yesterday after several days of rehydration.  Feels she is ready to go home.   Allergies: Penicillins, Cefaclor, Erythromycin, Tape, Trimethoprim, Ultram [tramadol], Cephalosporins, Fluconazole, Oxycodone, Pectin, Septra [sulfamethoxazole-trimethoprim], and Sulfa antibiotics  Medications: Prior to Admission medications   Medication Sig Start Date End Date Taking? Authorizing Provider  acetaminophen (TYLENOL) 325 MG tablet Take 2 tablets (650 mg total) by mouth every 6 (six) hours as needed. 09/24/17  Yes Emokpae, Courage, MD  anastrozole (ARIMIDEX) 1 MG tablet TAKE 1 TABLET DAILY Patient taking differently: Take 1 mg by mouth daily.  01/30/18  Yes Gorsuch, Ni, MD  Biotin 5 MG TABS Take 5 mg by mouth daily.    Yes [provider]  buPROPion (WELLBUTRIN SR) 200 MG 12 hr tablet Take 1 tablet (200 mg total) by mouth daily. 01/09/19  Yes Beasley, Caren D, MD  Cholecalciferol (VITAMIN D3) 10000 UNITS capsule Take 10,000 Units by mouth every Sunday.    Yes [provider]  clobetasol (OLUX) 0.05 % topical foam Apply topically 2 (two) times daily.   Yes [provider]  diphenhydrAMINE (BENADRYL) 25 mg capsule Take 1 capsule (25 mg total) by mouth every 8 (eight) hours as needed for itching, allergies or sleep. 09/24/17  Yes Emokpae, Courage, MD  diphenoxylate-atropine (LOMOTIL) 2.5-0.025 MG tablet Take 1 tablet by mouth 4 (four) times daily as needed for diarrhea or loose stools. TO PREVENT LOOSE BOWEL MOVEMENTS 09/24/17  Yes Emokpae, Courage, MD  fentaNYL (DURAGESIC) 50 MCG/HR Place 1 patch onto the skin every 3 (three) days. 01/08/19  Yes [name] R, DO  ferrous sulfate 325 (65 FE) MG tablet Take 1 tablet (325 mg total) by mouth at bedtime. 09/24/17  Yes Emokpae,  Courage, MD  filgrastim-sndz (ZARXIO) 480 MCG/0.8ML SOSY injection Inject 0.8 mLs (480 mcg total) into the skin as directed. Inject 480 mcg every 5 days 12/04/18 12/04/19 Yes Gorsuch, Ni, MD  HYDROcodone-acetaminophen (NORCO/VICODIN) 5-325 MG tablet Take 2 tablets by mouth every 6 (six) hours as needed for moderate pain. 01/08/19  Yes Lowne Chase, Yvonne R, DO  JANUVIA 50 MG tablet Take 50 mg by mouth daily.  10/28/17  Yes [provider]  levothyroxine (SYNTHROID, LEVOTHROID) 150 MCG tablet Take 1 tablet (150 mcg total) by mouth daily before breakfast. 09/25/17  Yes Emokpae, Courage, MD  loratadine (CLARITIN) 10 MG tablet Take 10 mg by mouth daily.    Yes [provider]  nitrofurantoin, macrocrystal-monohydrate, (MACROBID) 100 MG capsule Take 100 mg by mouth at bedtime.   Yes [provider]  omega-3 acid ethyl esters (LOVAZA) 1 G capsule Take 1 g by mouth 2 (two) times daily.    Yes [provider]  Polyethyl Glycol-Propyl Glycol (SYSTANE OP) Place 1 drop into both eyes daily as needed (dry eyes).    Yes [provider]  pregabalin (LYRICA) 50 MG capsule TAKE 1 CAPSULE(50 MG) BY MOUTH TWICE DAILY Patient taking differently: Take 50 mg by mouth 2 (two) times daily.  10/02/18  Yes Ann Held, DO  Prenatal Vit-Fe Fumarate-FA (PRENATAL VITAMIN PO) Take 1 capsule by mouth daily. Takes prenatal because there are no dyes in it   Yes [provider]  rosuvastatin (CRESTOR) 10 MG tablet Take 10 mg by mouth every evening.  Yes [provider]  Saccharomyces boulardii (FLORASTOR PO) Take 1 capsule by mouth daily. 02/16/18  Yes [provider]     Vital Signs: BP 129/66 (BP Location: Left Arm)    Pulse 61    Temp 97.9 F (36.6 C) (Oral)    Resp 16    Ht 5' 2.5" (1.588 m)    Wt 204 lb 2.3 oz (92.6 kg)    SpO2 100%    BMI 36.74 kg/m   Physical Exam  NAD, alert Back/Flank: Bilateral tubes in place.  Insertion site intact.  Minimal  amount of leakage on dressing.  Yellow urine with some sediment in both collection bags.  Foley remains in place.   Imaging: Dg Chest Port 1 View  Result Date: 01/19/2019 CLINICAL DATA:  history of endometrial and ovarian cancer status post surgery complicated with infection and presently patient has bilateral nephrostomy ileostomy and Foley catheter was recently treated for UTI with IV antibiotics until yesterday EXAM: PORTABLE CHEST 1 VIEW COMPARISON:  10/04/2018 and 11/29/2017 radiographs. FINDINGS: Enteric tube projects below the diaphragm, tip not visualized. Right arm PICC is unchanged at the mid SVC level. The heart size and mediastinal contours are stable. There is mildly increased opacity at the left lung base, probably atelectasis. No pneumothorax or significant pleural effusion. IMPRESSION: Satisfactory position of the enteric tube. Mildly increased left basilar atelectasis. Electronically Signed   By: Richardean Sale M.D.   On: 01/19/2019 09:24   Dg Abd 2 Views  Result Date: 01/21/2019 CLINICAL DATA:  Small bowel obstruction. EXAM: ABDOMEN - 2 VIEW COMPARISON:  01/20/2019 FINDINGS: Nasogastric tube unchanged with tip over the stomach in the left upper quadrant. Bilateral percutaneous nephrostomy tubes unchanged. Single air-filled dilated small bowel loop in the left lower quadrant. Relative paucity of bowel gas throughout the remainder of the abdomen. No free peritoneal air. Multiple surgical clips over the midline lower abdomen/pelvis. Surgical clips in the right upper quadrant. Remainder the exam is unchanged. IMPRESSION: Persistent single air-filled mildly dilated small bowel loop in the left lower quadrant. Multiple tubes and lines as described. Electronically Signed   By: Marin Olp M.D.   On: 01/21/2019 10:42   Dg Abd Portable 1v  Result Date: 01/20/2019 CLINICAL DATA:  Small bowel obstruction. EXAM: PORTABLE ABDOMEN - 1 VIEW COMPARISON:  01/19/2019 FINDINGS: Relative paucity of  bowel gas. Persistent mildly dilated small bowel loop in the left upper quadrant of the abdomen measures 3.3 cm transversely. Enteric catheter overlies the expected location of the stomach. Bilateral PICC tail catheters are stable. Postsurgical changes in the pelvis. IMPRESSION: 1. Relative paucity of bowel gas. 2. Persistent mildly dilated small bowel loop in the left upper quadrant of the abdomen. Electronically Signed   By: Fidela Salisbury M.D.   On: 01/20/2019 15:29   Dg Abd Portable 1v  Result Date: 01/19/2019 CLINICAL DATA:  Followup small-bowel obstruction. EXAM: PORTABLE ABDOMEN - 1 VIEW COMPARISON:  01/18/2019 FINDINGS: Bilateral nephrostomy tubes are stable. The NG tube is in the fundal region of the stomach and the proximal port is likely at or above the GE junction. Single loop of air distended small bowel in the left upper quadrant but overall slight improved bowel gas pattern. No free air. IMPRESSION: 1. The NG tube tip has pulled back into the fundal region. 2. Single slightly dilated air-filled loop of small bowel in the left upper quadrant but overall improved bowel gas pattern. Electronically Signed   By: Marijo Sanes M.D.   On:  01/19/2019 14:00    Labs:  CBC: Recent Labs    01/05/19 1010 01/17/19 0351 01/19/19 0339 01/22/19 0343  WBC 4.6 13.7* 4.6 21.0*  HGB 11.1* 11.5* 9.5* 9.3*  HCT 36.6 37.6 31.7* 30.0*  PLT 259 279 213 193    COAGS: Recent Labs    01/05/19 1010  INR 0.9    BMP: Recent Labs    01/05/19 1010 01/17/19 0351 01/19/19 0339 01/22/19 0343  NA 139 141 147* 142  K 4.1 4.0 3.4* 2.9*  CL 112* 101 110 107  CO2 21* 28 29 25   GLUCOSE 103* 164* 80 81  BUN 35* 24* 20 13  CALCIUM 9.2 9.4 8.9 8.4*  CREATININE 1.60* 1.52* 1.25* 1.27*  GFRNONAA 34* 36* 45* 45*  GFRAA 39* 42* 53* 52*    LIVER FUNCTION TESTS: Recent Labs    05/19/18 1127 09/28/18 1441 10/04/18 1345 11/29/18 01/17/19 0351 01/22/19 0343  BILITOT 0.4 0.4 0.5  --  0.6  --    AST 30 16 28  46* 24  --   ALT 54* 29 55* 70* 53*  --   ALKPHOS 364* 284* 550* 377* 305*  --   PROT 8.5* 8.6* 8.4*  --  7.4  --   ALBUMIN 2.8* 3.2* 3.0*  --  2.9* 2.4*    Assessment and Plan: Bilateral ureteral obstruction s/p bilateral nephrostomy tube placements in IR 09/02/17, most recently exchanged 01/05/19 by Dr. Harlen Labs had become clogged on admission and was exchanged by IR PA over the weekend. Now with good UOP and flow through tubing.  No complaints this AM.  Has follow-up scheduled for exchange with sedation 02/15/2019 at [facility]. Maintains good care of tubes at home with help of [facility] and husband.  IR available if needed.  Electronically Signed: Docia Barrier, PA 01/22/2019, 10:23 AM   I spent a total of 15 Minutes at the the patient's bedside AND on the patient's hospital floor or unit, greater than 50% of which was counseling/coordinating care for bilateral ureteral obstruction.

## 2019-01-22 NOTE — Progress Notes (Signed)
Patient discharged to home with picc line and foley catheter in place per MD orders. Patient verbalized understanding of all discharge instructions including nephrostomy care, ileostomy care, discharge medications and follow up MD visits.

## 2019-01-22 NOTE — Discharge Summary (Signed)
Physician Discharge Summary  Patient ID: [name] MRN: [id number] DOB/AGE: [birth year] 63 y.o.  Admit date: 01/17/2019 Discharge date: 01/22/2019  Admission Diagnoses:  Discharge Diagnoses:  Principal Problem:   SBO (small bowel obstruction) ([place]) Active Problems:   Primary hypothyroidism   Benign essential HTN   Type II diabetes mellitus with renal manifestations [facility])   Discharged Condition: stable  Hospital Course: Patient is a 63 year old Caucasian female with past medical history significant for endometrial and ovarian cancer status post surgery and irradiation, s/p bilateral nephrostomy and ileostomy.  Patient presented with persistent diffuse abdominal pain, associated nausea and vomiting.  Work-up revealed small bowel obstruction.  Patient was admitted for further assessment and management.  Patient was managed supportively.  Patient was kept n.p.o.  NG tube was placed to low suction.  Surgical team was consulted.  The surgery team assisted in directing patient's care.  Small bowel obstruction has resolved.  Patient is tolerating oral feeds.  Patient will be discharged back on to the care of the primary care provider and oncology team.  Fluid and electrolytes were also monitored during the hospital stay.  Patient's electrolytes have been optimized, will be discharged back home today.  Surgical team is cleared patient for discharge.  Small bowel obstruction: Patient was managed conservatively.   NG tube to low suction.   Adequate hydration.   Fluid and electrolytes were optimized.   Care team was consulted to assist with patient's management.   Small bowel obstruction has resolved. Patient is tolerating oral feeds.    Hypothyroidism: Continue Synthroid.    Diabetes mellitus type 2: Accu-Cheks. Sliding scale insulin coverage.  Chronic kidney disease stage III Creatinine appears to be at baseline follow metabolic panel. Continue to monitor  closely.  Anemia: Stable. Continue to monitor.  Hypertension: Continue to optimize.   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  Nephrostomy tube:  Dimension of radiology team directed care.  Consults: general surgery and Interventional Radiology  Significant Diagnostic Studies:  CT abdomen pelvis with contrast revealed: Small bowel obstruction. Transition is near the umbilicus, likely related to adhesions. No definite strangulated bowel is present. 2. Decompressed small bowel extends into a separate ventral hernia more superiorly and to the left. 3. Ostomy is decompressed. 4. Bilateral percutaneous nephrostomy drains in place. Collecting systems are decompressed. 5. Foley catheter in place. 6. Stable postsurgical changes of the pelvis.  Treatments: Patient was managed conservatively.  Discharge Exam: Blood pressure 129/66, pulse 61, temperature 97.9 F (36.6 C), temperature source Oral, resp. rate 16, height 5' 2.5" (1.588 m), weight 92.6 kg, SpO2 100 %.   Disposition: Discharge disposition: 06-Home-Health Care Svc   Discharge Instructions    Diet - low sodium heart healthy   Complete by: As directed    Increase activity slowly   Complete by: As directed      Allergies as of 01/22/2019      Reactions   Penicillins Swelling   Facial swelling/childhood allergy Has patient had a PCN reaction causing immediate rash, facial/tongue/throat swelling, SOB or lightheadedness with hypotension: Yes Has patient had a PCN reaction causing severe rash involving mucus membranes or skin necrosis: Yes Has patient had a PCN reaction that required hospitalization yes Has patient had a PCN reaction occurring within the last 10 years: No If all of the above  answers are "NO", then  may proceed with Cephalosporin use.   Cefaclor Rash   Ceclor   Erythromycin Other (See Comments)   Gastritis, abd cramps   Tape Rash   blisters   Trimethoprim Rash   Ultram [tramadol] Hives   Cephalosporins Rash   Fluconazole Rash   Oxycodone Other (See Comments)   " I just feel weird"   Pectin Rash   Pectin ring for stoma   Septra [sulfamethoxazole-trimethoprim] Rash   Sulfa Antibiotics Rash      Medication List    TAKE these medications   acetaminophen 325 MG tablet Commonly known as: Tylenol Take 2 tablets (650 mg total) by mouth every 6 (six) hours as needed.   anastrozole 1 MG tablet Commonly known as: ARIMIDEX TAKE 1 TABLET DAILY   Biotin 5 MG Tabs Take 5 mg by mouth daily.   buPROPion 200 MG 12 hr tablet Commonly known as: WELLBUTRIN SR Take 1 tablet (200 mg total) by mouth daily.   clobetasol 0.05 % topical foam Commonly known as: OLUX Apply topically 2 (two) times daily.   diphenhydrAMINE 25 mg capsule Commonly known as: BENADRYL Take 1 capsule (25 mg total) by mouth every 8 (eight) hours as needed for itching, allergies or sleep.   diphenoxylate-atropine 2.5-0.025 MG tablet Commonly known as: LOMOTIL Take 1 tablet by mouth 4 (four) times daily as needed for diarrhea or loose stools. TO PREVENT LOOSE BOWEL MOVEMENTS   fentaNYL 50 MCG/HR Commonly known as: [place] 1 patch onto the skin every 3 (three) days.   ferrous sulfate 325 (65 FE) MG tablet Take 1 tablet (325 mg total) by mouth at bedtime.   filgrastim-sndz 480 MCG/0.8ML Sosy injection Commonly known as: Zarxio Inject 0.8 mLs (480 mcg total) into the skin as directed. Inject 480 mcg every 5 days   FLORASTOR PO Take 1 capsule by mouth daily.   HYDROcodone-acetaminophen 5-325 MG tablet Commonly known as: NORCO/VICODIN Take 2 tablets by mouth every 6 (six) hours as needed for moderate pain.   Januvia 50 MG tablet Generic drug: sitaGLIPtin Take 50 mg by  mouth daily.   levothyroxine 150 MCG tablet Commonly known as: SYNTHROID Take 1 tablet (150 mcg total) by mouth daily before breakfast.   loratadine 10 MG tablet Commonly known as: CLARITIN Take 10 mg by mouth daily.   nitrofurantoin (macrocrystal-monohydrate) 100 MG capsule Commonly known as: MACROBID Take 100 mg by mouth at bedtime.   omega-3 acid ethyl esters 1 g capsule Commonly known as: LOVAZA Take 1 g by mouth 2 (two) times daily.   pregabalin 50 MG capsule Commonly known as: Lyrica TAKE 1 CAPSULE(50 MG) BY MOUTH TWICE DAILY What changed:   how much to take  how to take this  when to take this  additional instructions   PRENATAL VITAMIN PO Take 1 capsule by mouth daily. Takes prenatal because there are no dyes in it   rosuvastatin 10 MG tablet Commonly known as: CRESTOR Take 10 mg by mouth every evening.   SYSTANE OP Place 1 drop into both eyes daily as needed (dry eyes).   Vitamin D3 250 MCG (10000 UT) capsule Take 10,000 Units by mouth every Sunday.        SignedBonnell Public 01/22/2019, 4:17 PM

## 2019-01-22 NOTE — TOC Initial Note (Signed)
Transition of Care [facility]) - Initial/Assessment Note    Patient Details  Name: [name] MRN: [id number] Date of Birth: [birth year]  [facility] CM/SW Contact:    Marilu Favre, RN Phone Number: 01/22/2019, 12:22 PM  Clinical Narrative:                 Patient from home with [facility] RN and [facility]. Waunita Schooner both aware patient has been admitted, will need resumption of care orders sent private chat to MD .  Patient from home with husband. Confirmed face sheet information.  Expected Discharge Plan: [place] Barriers to Discharge: Continued Medical Work up   Patient Goals and CMS Choice Patient states their goals for this hospitalization and ongoing recovery are:: to return to home CMS Medicare.gov Compare Post Acute Care list provided to:: Patient Choice offered to / list presented to : Patient  Expected Discharge Plan and Services Expected Discharge Plan: [place]   Discharge Planning Services: CM Consult Post Acute Care Choice: [place] arrangements for the past 2 months: Single Family Home                 DME Arranged: N/A           HH Agency: [place] ([place]) Date [place]: 01/22/19 Time [place]: 1220 Representative spoke with at [place]: [place] Arrangements/Services Living arrangements for the past 2 months: [place] with:: Spouse Patient language and need for interpreter reviewed:: Yes Do you feel safe going back to the place where you live?: Yes      Need for Family Participation in Patient Care: Yes (Comment) Care giver support system in place?: Yes (comment) Current home services: Home RN Criminal Activity/Legal Involvement Pertinent to Current Situation/Hospitalization: No - Comment as needed  Activities of Daily Living Home Assistive Devices/Equipment: Eyeglasses, Environmental consultant (specify type) ADL  Screening (condition at time of admission) Patient's cognitive ability adequate to safely complete daily activities?: Yes Is the patient deaf or have difficulty hearing?: No Does the patient have difficulty seeing, even when wearing glasses/contacts?: No Does the patient have difficulty concentrating, remembering, or making decisions?: No Patient able to express need for assistance with ADLs?: Yes Does the patient have difficulty dressing or bathing?: No Independently performs ADLs?: Yes (appropriate for developmental age) Does the patient have difficulty walking or climbing stairs?: No Weakness of Legs: Both Weakness of Arms/Hands: None  Permission Sought/Granted Permission sought to share information with : Case Manager       Permission granted to share info w AGENCY: [place] , [facility]        Emotional Assessment Appearance:: Appears stated age Attitude/Demeanor/Rapport: Engaged Affect (typically observed): Accepting Orientation: : Oriented to Self, Oriented to Place, Oriented to  Time, Oriented to Situation Alcohol / Substance Use: Not Applicable Psych Involvement: No (comment)  Admission diagnosis:  Small bowel obstruction (HCC) [K56.609] SBO (small bowel obstruction) ([place]) [K56.609] Patient Active Problem List   Diagnosis Date Noted  . SBO (small bowel obstruction) ([place]) 01/17/2019  . Depression 01/16/2019  . Other chronic postprocedural pain 05/25/2018  . Malignant neoplasm of uterus ([place]) 05/25/2018  . Recurrent UTI 03/08/2018  . Acute pyelonephritis 11/29/2017  . HLD (hyperlipidemia) 11/29/2017  . GERD (gastroesophageal reflux disease) 11/29/2017  . Type II diabetes mellitus with renal manifestations ([place]) 11/29/2017  . Encounter for palliative care   .  Goals of care, counseling/discussion   . Poor venous access   . Acute renal failure superimposed on chronic kidney disease ([place]) 10/02/2017  . Decubitus ulcer of sacral region, stage 2  ([place]) 10/02/2017  . Metabolic acidosis 30/04/2329  . MDD (major depressive disorder), single episode, moderate ([place])   . Poorly controlled diabetes mellitus ([place]) 08/31/2017  . Pressure injury of skin 08/31/2017  . 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 History of external beam radiation therapy to pelvis 08/31/2017  . ARF (acute renal failure) ([place]) 08/30/2017  . Adjustment disorder with mixed anxiety and depressed mood 08/30/2017  . Dehydration 08/29/2017  . CKD (chronic kidney disease), stage III ([place]) 08/24/2017  . Wound of right buttock 08/10/2017  . 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 Protein-calorie malnutrition, moderate ([place]) 04/18/2017  . Postoperative anemia 04/04/2017  . 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 Hot flashes related to aromatase inhibitor therapy 03/18/2017  . 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 Vitamin D insufficiency 08/30/2016  . Breast cancer of upper-inner quadrant of left female breast ([place])   . Pelvic pain in female 03/19/2015  . Chronic anemia 12/30/2014  . UTI (urinary tract infection)   . Pancytopenia, acquired ([place]) 11/26/2014  . Class 1 obesity with serious comorbidity and body mass index (BMI) of 34.0 to 34.9 in adult 11/20/2014  . 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 Abdominal pain 09/09/2014  . Postmenopausal bleeding 09/05/2014  . History of ovarian & endometrial cancer 07/17/2012  . 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[place]) 05/10/2012  . Chronic neutropenia ([place]) 12/14/2011  . Primary hypothyroidism 12/14/2011  . DM type 2 (diabetes mellitus, type 2) ([place]) 12/14/2011  . Benign essential HTN 12/14/2011   PCP:  Ann Held, DO Pharmacy:   [name]  [facility] STORE [place], [place] - [address] [place] [address] Phone: [phone] Fax: [phone]     Social Determinants of Health (SDOH) Interventions    Readmission Risk Interventions No flowsheet data found.

## 2019-01-23 ENCOUNTER — Telehealth (INDEPENDENT_AMBULATORY_CARE_PROVIDER_SITE_OTHER): Payer: Self-pay | Admitting: Family Medicine

## 2019-01-23 ENCOUNTER — Telehealth: Payer: Self-pay | Admitting: Family Medicine

## 2019-01-23 IMAGING — CT CT ABD-PELV W/O CM
2 of 4 series · 16 of 46 positions shown, 18 images · non-contrast
Comparison: CT scans 10/02/2017 and 11/29/2017.

CLINICAL DATA: Restaging endometrial cancer.

EXAM:
CT ABDOMEN AND PELVIS WITHOUT CONTRAST
TECHNIQUE: Multidetector CT imaging of the abdomen and pelvis was performed
following the standard protocol without IV contrast.

[Series 2: axial st · axial · 0.98mm/px · z∈[-610,-210]mm · 13 of 90 slices shown, 15 images]
[im 5/90  soft-tissue]
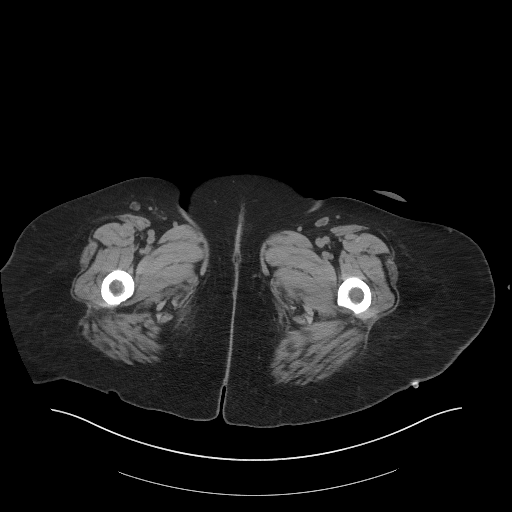
[im 5/90  bone]
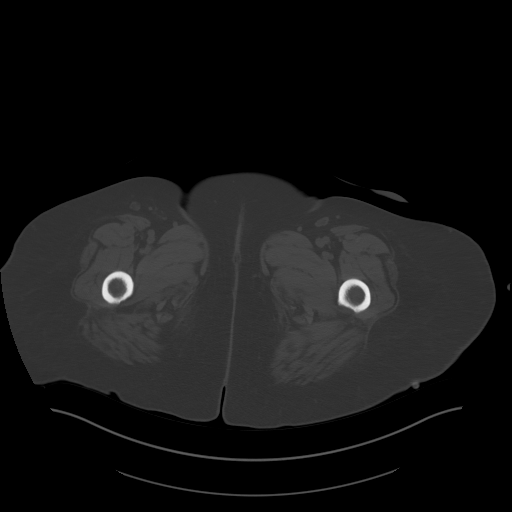
[im 15/90  soft-tissue]
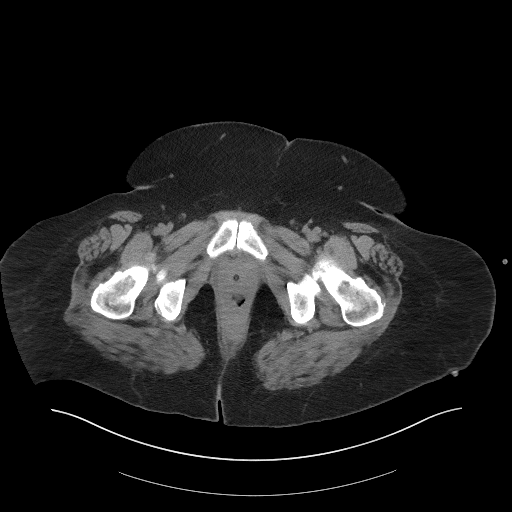
[im 19/90  soft-tissue]
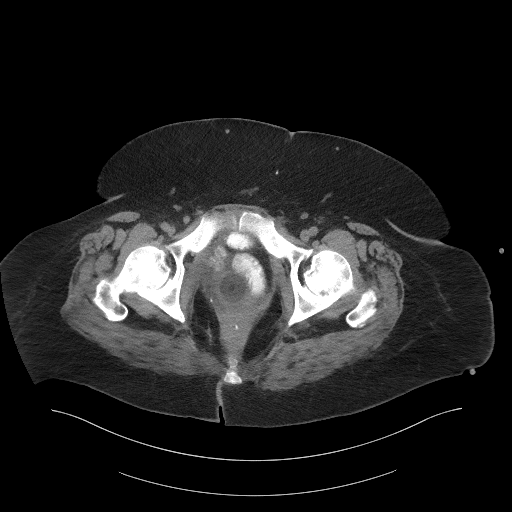
[im 24/90  soft-tissue]
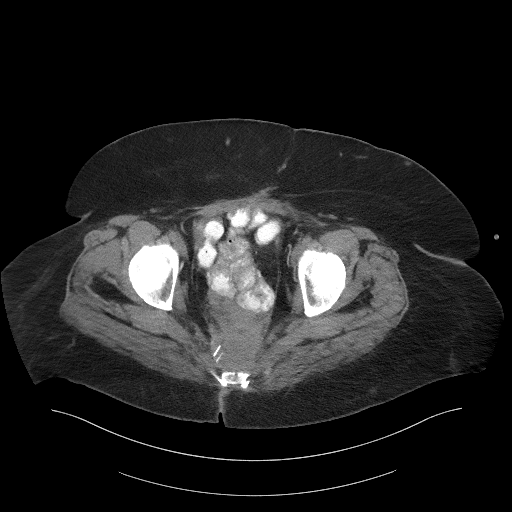
[im 33/90  soft-tissue]
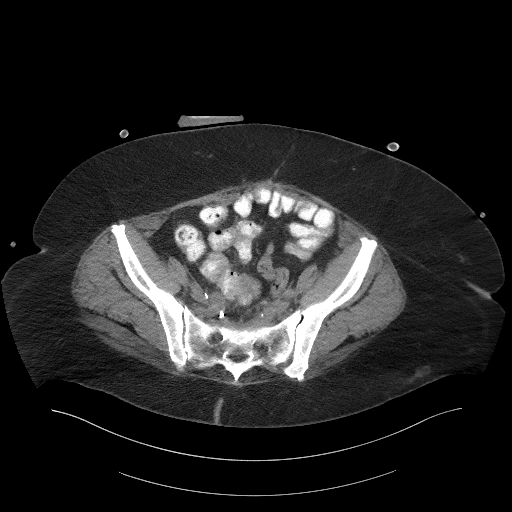
[im 38/90  soft-tissue]
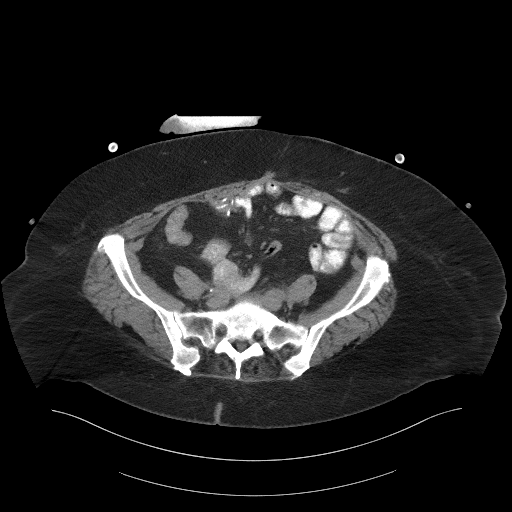
[im 47/90  soft-tissue]
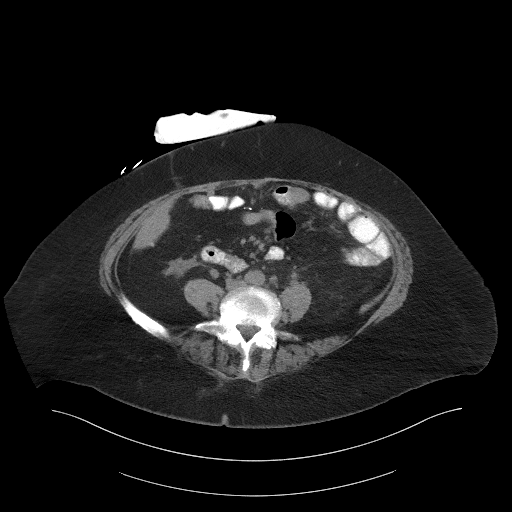
[im 52/90  soft-tissue]
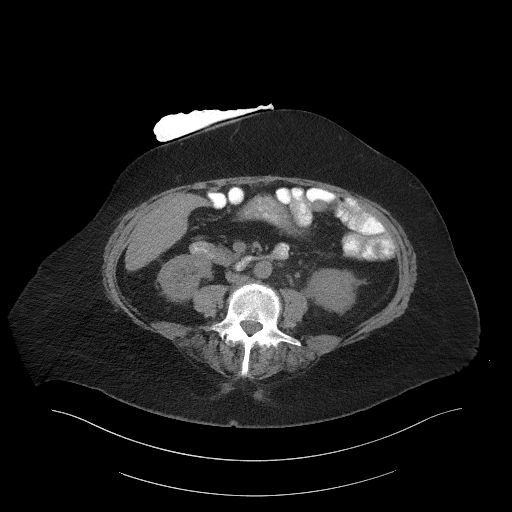
[im 57/90  soft-tissue]
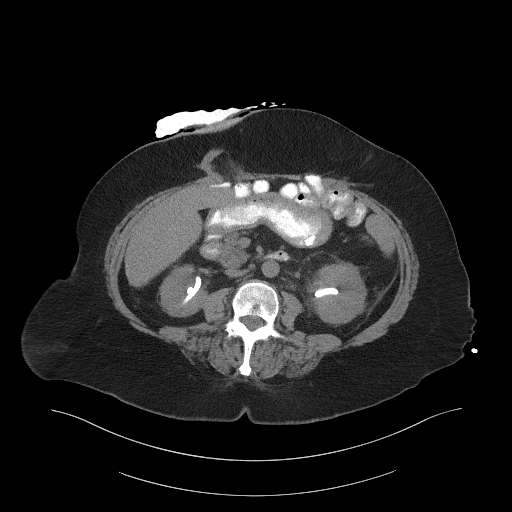
[im 57/90  bone]
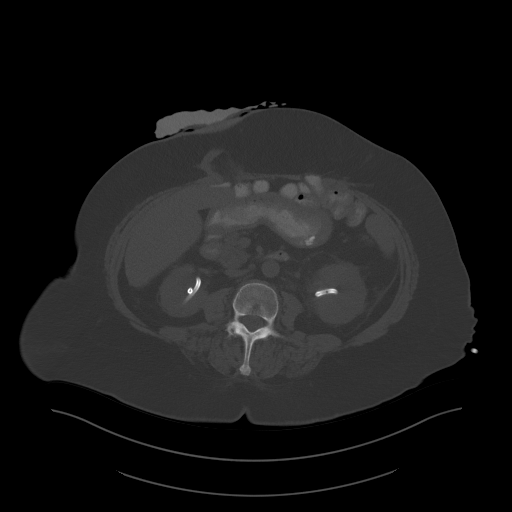
[im 66/90  soft-tissue]
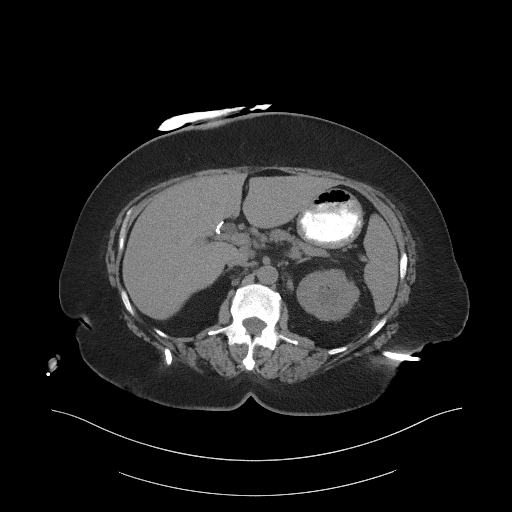
[im 71/90  soft-tissue]
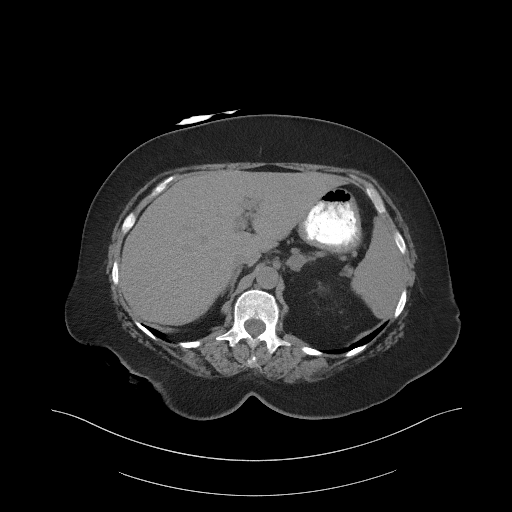
[im 75/90  soft-tissue]
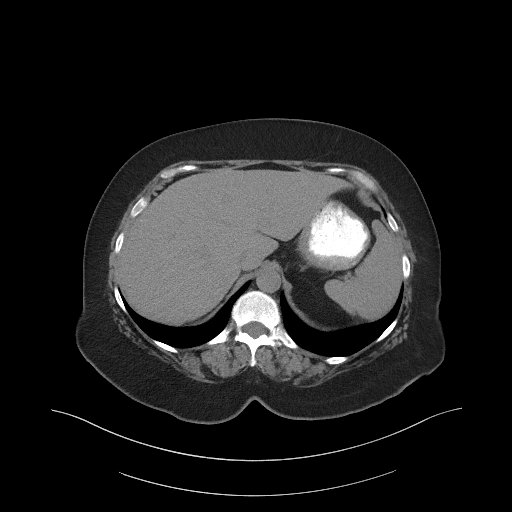
[im 85/90  soft-tissue]
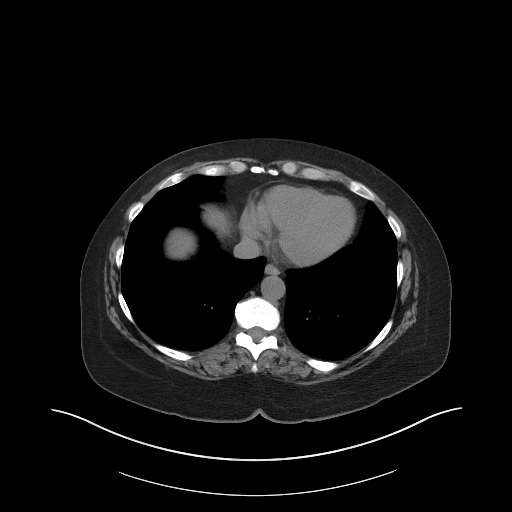

[Series 5: coronal st · coronal · 0.91mm/px · 3 of 109 slices shown]
[im 37/109  soft-tissue]
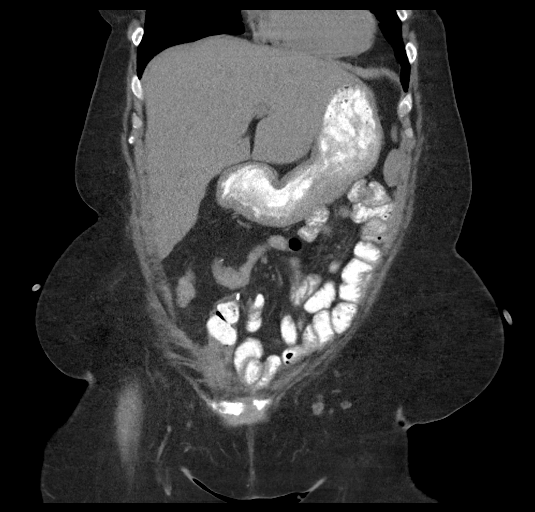
[im 49/109  soft-tissue]
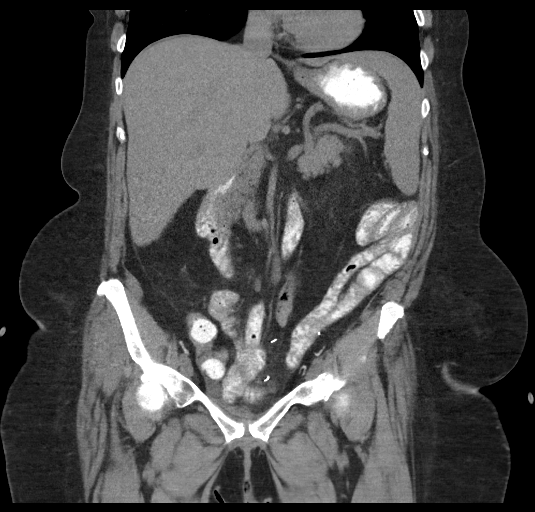
[im 61/109  soft-tissue]
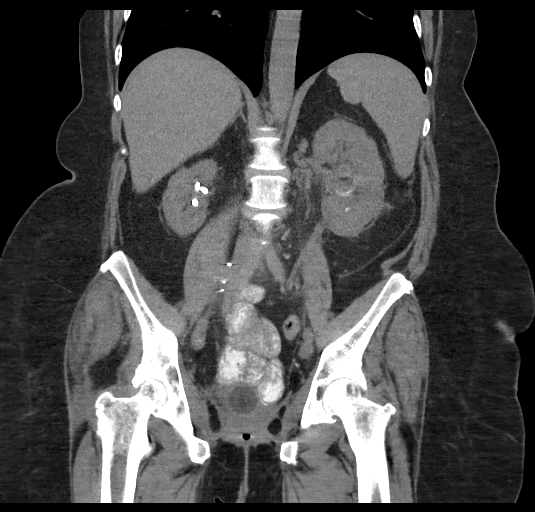

[16 of 46 positions shown; findings below may reference images not displayed]

FINDINGS: Lower chest: The lung bases are clear of an acute process. No
worrisome pulmonary nodules to suggest metastatic disease. The heart
is normal in size. No pericardial effusion.

Hepatobiliary: No focal hepatic lesions or findings suspicious for
peritoneal surface disease. The gallbladder is surgically absent. No
common bile duct dilatation.

Pancreas: No mass, inflammation or ductal dilatation.

Spleen: Normal size.  No focal lesions.

Adrenals/Urinary Tract: Stable left adrenal gland lesion, most
likely a lipid poor adenoma. No change since 6807. The right adrenal
gland is normal.

Bilateral nephrostomy tubes in place with mild hydronephrosis.
Difficult to identified distal ureters.

The bladder contains a Foley catheter.

Stomach/Bowel: Stable surgical changes with a right-sided ileostomy.
Status post near total colectomy. No bowel obstruction.

Vascular/Lymphatic: The aorta is normal in caliber. No
atherosclerotic calcifications. Small scattered retroperitoneal
lymph nodes appears stable. No new or progressive findings.

Reproductive: The uterus is surgically absent. I do not see the left
ovary either. The right ovary may still be in place noted on image
number 63 and measuring 17 mm.

Other: Stable large amorphous soft tissue mass in the presacral
space measuring approximately 7.4 x 5.8 cm. This is likely a
combination of treated tumor and postoperative change. No obvious
progressive findings or abscess. No pelvic lymphadenopathy or
inguinal lymphadenopathy.

Musculoskeletal: No significant bony findings.
IMPRESSION: 1. Extensive postoperative changes in the pelvis with a large
amorphous soft tissue mass in the presacral space, unchanged since
the prior CT scan and likely combination of treated tumor in
postoperative change.
2. Right upper quadrant ileostomy without complicating features. No
bowel obstruction.
3. Bilateral nephrostomy tubes with mild hydronephrosis. Small right
kidney.
4. Small scattered retroperitoneal lymph nodes appears stable. No
new or progressive findings.
5. No evidence of omental or peritoneal surface disease.
6. No pulmonary nodules at the lung bases.

## 2019-01-23 NOTE — Telephone Encounter (Signed)
Please advise 

## 2019-01-23 NOTE — Telephone Encounter (Signed)
Spoke w/ Pt- informed of PCP recommendations. Pt verbalized understanding.  

## 2019-01-23 NOTE — Telephone Encounter (Signed)
Patient rescheduled her 6/18 appt to 6/25.  Patient was hospitalized last week for a bowel obstruction.  While hospitalized they took her off [place].  She wants to know if it is ok to start taking that again and if so as originally prescribed.    Thank you

## 2019-01-23 NOTE — Telephone Encounter (Signed)
That is fine 

## 2019-01-23 NOTE — Telephone Encounter (Signed)
Yes, it is ok to restart when she is feeling better

## 2019-01-23 NOTE — Telephone Encounter (Signed)
Please review

## 2019-01-23 NOTE — Telephone Encounter (Signed)
Pt was advised okay to restart Wellbutrin once she is feeling better. Lennette Bihari, CMA

## 2019-01-23 NOTE — Telephone Encounter (Signed)
Pt was in the hospital for 7 days and was without her Lyrica, pt want to know if she can stop taking it because she isn't having the pain she was having 79yr ago. Please advise.

## 2019-01-24 DIAGNOSIS — N183 Chronic kidney disease, stage 3 (moderate): Secondary | ICD-10-CM | POA: Diagnosis not present

## 2019-01-24 DIAGNOSIS — D631 Anemia in chronic kidney disease: Secondary | ICD-10-CM | POA: Diagnosis not present

## 2019-01-24 DIAGNOSIS — T83592A Infection and inflammatory reaction due to indwelling ureteral stent, initial encounter: Secondary | ICD-10-CM | POA: Diagnosis not present

## 2019-01-24 DIAGNOSIS — T82594D Other mechanical complication of infusion catheter, subsequent encounter: Secondary | ICD-10-CM | POA: Diagnosis not present

## 2019-01-24 DIAGNOSIS — E785 Hyperlipidemia, unspecified: Secondary | ICD-10-CM | POA: Diagnosis not present

## 2019-01-24 DIAGNOSIS — L89309 Pressure ulcer of unspecified buttock, unspecified stage: Secondary | ICD-10-CM | POA: Diagnosis not present

## 2019-01-24 DIAGNOSIS — L988 Other specified disorders of the skin and subcutaneous tissue: Secondary | ICD-10-CM | POA: Diagnosis not present

## 2019-01-24 DIAGNOSIS — E860 Dehydration: Secondary | ICD-10-CM | POA: Diagnosis not present

## 2019-01-24 DIAGNOSIS — Z932 Ileostomy status: Secondary | ICD-10-CM | POA: Diagnosis not present

## 2019-01-25 ENCOUNTER — Other Ambulatory Visit: Payer: Self-pay

## 2019-01-25 ENCOUNTER — Other Ambulatory Visit: Payer: Self-pay | Admitting: Hematology and Oncology

## 2019-01-25 ENCOUNTER — Telehealth (INDEPENDENT_AMBULATORY_CARE_PROVIDER_SITE_OTHER): Payer: BC Managed Care – PPO | Admitting: Family Medicine

## 2019-01-25 ENCOUNTER — Encounter (INDEPENDENT_AMBULATORY_CARE_PROVIDER_SITE_OTHER): Payer: Self-pay | Admitting: Family Medicine

## 2019-01-25 DIAGNOSIS — Z6834 Body mass index (BMI) 34.0-34.9, adult: Secondary | ICD-10-CM | POA: Diagnosis not present

## 2019-01-25 DIAGNOSIS — E669 Obesity, unspecified: Secondary | ICD-10-CM | POA: Diagnosis not present

## 2019-01-25 DIAGNOSIS — F3289 Other specified depressive episodes: Secondary | ICD-10-CM

## 2019-01-26 DIAGNOSIS — E785 Hyperlipidemia, unspecified: Secondary | ICD-10-CM | POA: Diagnosis not present

## 2019-01-26 DIAGNOSIS — E860 Dehydration: Secondary | ICD-10-CM | POA: Diagnosis not present

## 2019-01-26 DIAGNOSIS — L89309 Pressure ulcer of unspecified buttock, unspecified stage: Secondary | ICD-10-CM | POA: Diagnosis not present

## 2019-01-29 DIAGNOSIS — L89309 Pressure ulcer of unspecified buttock, unspecified stage: Secondary | ICD-10-CM | POA: Diagnosis not present

## 2019-01-29 DIAGNOSIS — E860 Dehydration: Secondary | ICD-10-CM | POA: Diagnosis not present

## 2019-01-29 DIAGNOSIS — E785 Hyperlipidemia, unspecified: Secondary | ICD-10-CM | POA: Diagnosis not present

## 2019-01-29 NOTE — Progress Notes (Signed)
Office: [phone]  /  Fax: [phone] TeleHealth Visit:  [name] has verbally consented to this TeleHealth visit today. The patient is located at home, the provider is located at the [employer] [facility] office. The participants in this visit include the listed provider and patient. The visit was conducted today via telephone call (Doxy failed - changed to telephone call).  HPI:   Chief Complaint: OBESITY [name] is here to discuss her progress with her obesity treatment plan. She is keeping a food journal with 1200-1300 calories and 75 grams of protein and is following her eating plan approximately 50% of the time. She states she is walking 15 minutes 3 days per week. [name] thinks she has maintained her weight since our last visit. She was recently hospitalized for a SBO and is now home. She has done well maintaining her weight but wants to know what soft protein foods she can eat.  We were unable to weigh the patient today for this TeleHealth visit. She feels as if she has maintained her weight since her last visit. She has lost 18 lbs since starting treatment with [place].  Depression with emotional eating behaviors [name] is struggling with emotional eating and using food for comfort to the extent that it is negatively impacting her health. She often snacks when she is not hungry. [name] sometimes feels she is out of control and then feels guilty that she made poor food choices. She has been working on behavior modification techniques to help reduce her emotional eating and has been somewhat successful. [name] states she stopped Wellbutrin while in the hospital and feels she is doing well off the medication and would like to stay off and see how she does. She shows no sign of suicidal or homicidal ideations.  Depression screen [facility] 2/9 11/22/2018 11/07/2018 04/20/2018 04/05/2018 06/21/2017  Decreased Interest 0 0 0 1 0  Down, Depressed, Hopeless 0 0 1 1 0  PHQ - 2 Score 0 0 1 2 0  Altered  sleeping - - 0 0 -  Tired, decreased energy - - 1 1 -  Change in appetite - - 0 1 -  Feeling bad or failure about yourself  - - 0 0 -  Trouble concentrating - - 0 0 -  Moving slowly or fidgety/restless - - 0 0 -  Suicidal thoughts - - 0 0 -  PHQ-9 Score - - 2 4 -  Difficult doing work/chores - - - Somewhat difficult -  Some recent data might be hidden   ASSESSMENT AND PLAN:  Other depression  Class 1 obesity with serious comorbidity and body mass index (BMI) of 34.0 to 34.9 in adult, unspecified obesity type  PLAN:  Depression with Emotional Eating Behaviors We discussed behavior modification techniques today to help [name] deal with her emotional eating and depression. [name] will discontinue Wellbutrin for now and will work on emotional eating. She will return to the clinic as directed to monitor her progress.  I spent > than 50% of the 15 minute visit on counseling as documented in the note.  Obesity [name] is currently in the action stage of change. As such, her goal is to maintain her weight loss.  She has agreed to keep a food journal with 1200-1300 calories and 75 grams of protein.  We discussed soft foods with increased protein but lower calorie. The patient's goal is to maintain her weight loss for now. [name] has been instructed to work up to a goal of  150 minutes of combined cardio and strengthening exercise per week for weight loss and overall health benefits. We discussed the following Behavioral Modification Strategies today: increasing lean protein intake, increase H20 intake, and emotional eating strategies.  [name] has agreed to follow-up with our clinic in 2-3 weeks. She was informed of the importance of frequent follow-up visits to maximize her success with intensive lifestyle modifications for her multiple health conditions.  ALLERGIES: Allergies  Allergen Reactions  . Penicillins Swelling    Facial swelling/childhood allergy Has patient had a PCN reaction  causing immediate rash, facial/tongue/throat swelling, SOB or lightheadedness with hypotension: Yes Has patient had a PCN reaction causing severe rash involving mucus membranes or skin necrosis: Yes Has patient had a PCN reaction that required hospitalization yes Has patient had a PCN reaction occurring within the last 10 years: No If all of the above answers are "NO", then may proceed with Cephalosporin use.   . Cefaclor Rash    Ceclor  . Erythromycin Other (See Comments)    Gastritis, abd cramps  . Tape Rash    blisters  . Trimethoprim Rash  . Ultram [Tramadol] Hives  . Cephalosporins Rash  . Fluconazole Rash  . Oxycodone Other (See Comments)    " I just feel weird"  . Pectin Rash    Pectin ring for stoma  . Septra [Sulfamethoxazole-Trimethoprim] Rash  . Sulfa Antibiotics Rash    MEDICATIONS: Current Outpatient Medications on File Prior to Visit  Medication Sig Dispense Refill  . acetaminophen (TYLENOL) 325 MG tablet Take 2 tablets (650 mg total) by mouth every 6 (six) hours as needed. 30 tablet 1  . Biotin 5 MG TABS Take 5 mg by mouth daily.     . Cholecalciferol (VITAMIN D3) 10000 UNITS capsule Take 10,000 Units by mouth every Sunday.     . clobetasol (OLUX) 0.05 % topical foam Apply topically 2 (two) times daily.    . diphenhydrAMINE (BENADRYL) 25 mg capsule Take 1 capsule (25 mg total) by mouth every 8 (eight) hours as needed for itching, allergies or sleep. 30 capsule 0  . diphenoxylate-atropine (LOMOTIL) 2.5-0.025 MG tablet Take 1 tablet by mouth 4 (four) times daily as needed for diarrhea or loose stools. TO PREVENT LOOSE BOWEL MOVEMENTS 30 tablet 0  . fentaNYL (DURAGESIC) 50 MCG/HR Place 1 patch onto the skin every 3 (three) days. 10 patch 0  . ferrous sulfate 325 (65 FE) MG tablet Take 1 tablet (325 mg total) by mouth at bedtime. 30 tablet 3  . filgrastim-sndz (ZARXIO) 480 MCG/0.8ML SOSY injection Inject 0.8 mLs (480 mcg total) into the skin as directed. Inject 480 mcg  every 5 days 0.8 mL 99  . HYDROcodone-acetaminophen (NORCO/VICODIN) 5-325 MG tablet Take 2 tablets by mouth every 6 (six) hours as needed for moderate pain. 30 tablet 0  . JANUVIA 50 MG tablet Take 50 mg by mouth daily.     [name] Kitchen levothyroxine (SYNTHROID, LEVOTHROID) 150 MCG tablet Take 1 tablet (150 mcg total) by mouth daily before breakfast. 30 tablet 1  . loratadine (CLARITIN) 10 MG tablet Take 10 mg by mouth daily.     . nitrofurantoin, macrocrystal-monohydrate, (MACROBID) 100 MG capsule Take 100 mg by mouth at bedtime.    [name] Kitchen omega-3 acid ethyl esters (LOVAZA) 1 G capsule Take 1 g by mouth 2 (two) times daily.     Vladimir Faster Glycol-Propyl Glycol (SYSTANE OP) Place 1 drop into both eyes daily as needed (dry eyes).     . pregabalin (LYRICA)  50 MG capsule TAKE 1 CAPSULE(50 MG) BY MOUTH TWICE DAILY (Patient taking differently: Take 50 mg by mouth 2 (two) times daily. ) 180 capsule 3  . Prenatal Vit-Fe Fumarate-FA (PRENATAL VITAMIN PO) Take 1 capsule by mouth daily. Takes prenatal because there are no dyes in it    . rosuvastatin (CRESTOR) 10 MG tablet Take 10 mg by mouth every evening.     . Saccharomyces boulardii (FLORASTOR PO) Take 1 capsule by mouth daily.     No current facility-administered medications on file prior to visit.     PAST MEDICAL HISTORY: Past Medical History:  Diagnosis Date  . Adrenal adenoma, left 02/08/2016   CT: stable benign  . Anemia in neoplastic disease   . Back pain   . Benign essential HTN   . 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 Cancer of corpus uteri, except isthmus [facility])  oncologist-- dr Denman [name] and dr Alvy Bimler    10-15-2004  dx endometroid endometrial and ovarian cancer s/p  chemotheapy and surgery(TAH w/ BSO) :  recurrent 11-19-2014 post pelvic surgery and radiation  01-29-2015 to 03-10-2015  . Chronic idiopathic neutropenia (HCC)    presumed related to chemotherapy March 2006--- followed by dr Alvy Bimler (treatment w/ G-CSF injections  . Chronic nausea   . Chronic pain    perineal/ anal  area from bladder pad irritates skin , right flank pain  . CKD stage G2/A3, GFR 60-89 and albumin creatinine ratio >300 mg/g    nephrologist-  dr Madelon Lips  . Colovesical fistula   . Diabetic retinopathy, background ([place])   . Difficult intravenous access    small veins--- hx PICC lines  . DM type 2 (diabetes mellitus, type 2) ([place])    monitored by dr Legrand Como altheimer  . Dysuria   . Environmental and seasonal allergies   . Fatty liver 02/08/2016   CT  . Generalized muscle weakness   . GERD (gastroesophageal reflux disease)   . Hiatal hernia   . History of abdominal abscess 04/16/2017   post surgery 04-01-2017  --- resolved 10/ 2018  . History of gastric polyp    2014  duodenum  . History of ileus 04/16/2017   resolved w/ no surgical intervention  . History of radiation therapy    01-29-2015 to 03-10-2015  pelvis 50.4Gy  . Hypothyroidism    monitored by dr Legrand Como altheimer  . IBS (irritable bowel syndrome)   . Ileostomy in place [facility] 04/01/2017   created at same time colostomy takedown.  . Joint pain   . Leg edema   . Lower urinary tract symptoms (LUTS)    urge urinary  incontinence  . Mixed dyslipidemia   . Multiple thyroid nodules    Managed by Dr. Harlow Asa  . Nephrostomy status ([place])   . Palpitations   . Pelvic abscess in female 04/16/2017  . PONV (postoperative nausea and vomiting)    "scopolamine patch works for me"  . Radiation-induced dermatitis    contact dermatitis , radiation completed, rash only on ankles now.  . SBO (small bowel obstruction) ([place]) 01/2019  . Seasonal allergies   . Ureteral stricture, right UROLOGIT-  DR [facility]   CHRONIC--  TREATMENT URETERAL STENT  . 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 Vitamin D  deficiency   . Wears glasses  PAST SURGICAL HISTORY: Past Surgical History:  Procedure Laterality Date  . APPENDECTOMY    . biopsy thyroid nodules    . BREAST RECONSTRUCTION WITH PLACEMENT OF TISSUE EXPANDER AND FLEX HD (ACELLULAR HYDRATED DERMIS) Bilateral 12/11/2015   Procedure: BILATERAL BREAST RECONSTRUCTION WITH PLACEMENT OF TISSUE EXPANDERS;  Surgeon: Irene Limbo, MD;  Location: [place];  Service: Plastics;  Laterality: Bilateral;  . COLONOSCOPY WITH PROPOFOL N/A 08/21/2013   Procedure: COLONOSCOPY WITH PROPOFOL;  Surgeon: Cleotis Nipper, MD;  [facility];  Service: Endoscopy;  Laterality: N/A;  . COLOSTOMY TAKEDOWN N/A 12/04/2014   Procedure: LAPROSCOPIC LYSIS OF ADHESIONS, SPLENIC MOBILIZATION, RELOCATION OF COLOSTOMY, DEBRIDEMENT INITIAL COLOSTOMY SITE;  Surgeon: Michael [name], MD;  [facility];  Service: General;  Laterality: N/A;  . CYSTOGRAM N/A 06/01/2017   Procedure: CYSTOGRAM;  Surgeon: Alexis Frock, MD;  [facility];  Service: Urology;  Laterality: N/A;  . CYSTOSCOPY W/ RETROGRADES Right 11/21/2015   Procedure: CYSTOSCOPY WITH RETROGRADE PYELOGRAM;  Surgeon: Alexis Frock, MD;  [facility];  Service: Urology;  Laterality: Right;  . CYSTOSCOPY W/ URETERAL STENT PLACEMENT Right 11/21/2015   Procedure: CYSTOSCOPY WITH STENT REPLACEMENT;  Surgeon: Alexis Frock, MD;  [facility];  Service: Urology;  Laterality: Right;  . CYSTOSCOPY W/ URETERAL STENT PLACEMENT Right 03/10/2016   Procedure: CYSTOSCOPY WITH STENT REPLACEMENT;  Surgeon: Alexis Frock, MD;  Location[facility];  Service: Urology;  Laterality: Right;  . CYSTOSCOPY W/ URETERAL STENT PLACEMENT Right 06/30/2016   Procedure: CYSTOSCOPY WITH RETROGRADE PYELOGRAM/URETERAL STENT EXCHANGE;  Surgeon: Alexis Frock, MD;  Location[facility];  Service: Urology;  Laterality: Right;  . CYSTOSCOPY W/ URETERAL STENT PLACEMENT N/A 06/01/2017   Procedure:  CYSTOSCOPY WITH EXAM UNDER ANESTHESIA;  Surgeon: Alexis Frock, MD;  [facility];  Service: Urology;  Laterality: N/A;  . CYSTOSCOPY W/ URETERAL STENT PLACEMENT Right 08/17/2017   Procedure: CYSTOSCOPY WITH RETROGRADE PYELOGRAM/URETERAL STENT REMOVAL;  Surgeon: Alexis Frock, MD;  Location[facility];  Service: Urology;  Laterality: Right;  . CYSTOSCOPY WITH RETROGRADE PYELOGRAM, URETEROSCOPY AND STENT PLACEMENT Right 03/20/2015   Procedure: CYSTOSCOPY WITH RETROGRADE PYELOGRAM, URETEROSCOPY WITH BALLOON DILATION AND STENT PLACEMENT ON RIGHT;  Surgeon: Alexis Frock, MD;  Location: [facility];  Service: Urology;  Laterality: Right;  . CYSTOSCOPY WITH RETROGRADE PYELOGRAM, URETEROSCOPY AND STENT PLACEMENT Right 05/02/2015   Procedure: CYSTOSCOPY WITH RIGHT RETROGRADE PYELOGRAM,  DIAGNOSTIC URETEROSCOPY AND STENT PULL ;  Surgeon: Alexis Frock, MD;  Location: [facility];  Service: Urology;  Laterality: Right;  . CYSTOSCOPY WITH RETROGRADE PYELOGRAM, URETEROSCOPY AND STENT PLACEMENT Right 09/05/2015   Procedure: CYSTOSCOPY WITH RETROGRADE PYELOGRAM,  AND STENT PLACEMENT;  Surgeon: Alexis Frock, MD;  [facility];  Service: Urology;  Laterality: Right;  . CYSTOSCOPY WITH RETROGRADE PYELOGRAM, URETEROSCOPY AND STENT PLACEMENT Right 04/01/2017   Procedure: CYSTOSCOPY WITH RETROGRADE PYELOGRAM, URETEROSCOPY AND STENT PLACEMENT;  Surgeon: Alexis Frock, MD;  [facility];  Service: Urology;  Laterality: Right;  . CYSTOSCOPY WITH STENT PLACEMENT Right 10/27/2016   Procedure: CYSTOSCOPY WITH STENT CHANGE and right retrograde pyelogram;  Surgeon: Alexis Frock, MD;  Location: [facility];  Service: Urology;  Laterality: Right;  . EUS N/A 10/02/2014   Procedure: LOWER ENDOSCOPIC ULTRASOUND (EUS);  Surgeon: Arta Silence, MD;  Location: [facility];  Service: Endoscopy;  Laterality: N/A;  . EXCISION SOFT TISSUE MASS RIGHT FOREMAN   12-08-2006  . EYE SURGERY  as child   pytosis  of eyelids repair  . INCISION AND DRAINAGE OF WOUND Bilateral 12/26/2015   Procedure: DEBRIDEMENT OF BILATERAL MASTECTOMY FLAPS;  Surgeon: Irene Limbo, MD;  Location: [place];  Service: Plastics;  Laterality: Bilateral;  . IR CV LINE INJECTION  05/31/2017  . IR FLUORO GUIDE CV LINE LEFT  05/31/2017  . IR FLUORO GUIDE CV LINE RIGHT  04/06/2017  . IR FLUORO GUIDE CV MIDLINE PICC RIGHT  05/30/2017  . IR NEPHROSTOGRAM LEFT INITIAL PLACEMENT  09/02/2017  . IR NEPHROSTOGRAM LEFT THRU EXISTING ACCESS  11/29/2017  . IR NEPHROSTOGRAM RIGHT INITIAL PLACEMENT  09/02/2017  . IR NEPHROSTOGRAM RIGHT THRU EXISTING ACCESS  09/13/2017  . IR NEPHROSTOGRAM RIGHT THRU EXISTING ACCESS  11/29/2017  . IR NEPHROSTOMY EXCHANGE LEFT  11/28/2017  . IR NEPHROSTOMY EXCHANGE LEFT  01/05/2018  . IR NEPHROSTOMY EXCHANGE LEFT  02/16/2018  . IR NEPHROSTOMY EXCHANGE LEFT  03/30/2018  . IR NEPHROSTOMY EXCHANGE LEFT  05/12/2018  . IR NEPHROSTOMY EXCHANGE LEFT  06/21/2018  . IR NEPHROSTOMY EXCHANGE LEFT  08/04/2018  . IR NEPHROSTOMY EXCHANGE LEFT  09/18/2018  . IR NEPHROSTOMY EXCHANGE LEFT  10/09/2018  . IR NEPHROSTOMY EXCHANGE LEFT  10/27/2018  . IR NEPHROSTOMY EXCHANGE LEFT  11/21/2018  . IR NEPHROSTOMY EXCHANGE LEFT  01/05/2019  . IR NEPHROSTOMY EXCHANGE RIGHT  10/02/2017  . IR NEPHROSTOMY EXCHANGE RIGHT  11/28/2017  . IR NEPHROSTOMY EXCHANGE RIGHT  01/05/2018  . IR NEPHROSTOMY EXCHANGE RIGHT  02/16/2018  . IR NEPHROSTOMY EXCHANGE RIGHT  03/30/2018  . IR NEPHROSTOMY EXCHANGE RIGHT  05/12/2018  . IR NEPHROSTOMY EXCHANGE RIGHT  06/21/2018  . IR NEPHROSTOMY EXCHANGE RIGHT  08/04/2018  . IR NEPHROSTOMY EXCHANGE RIGHT  09/18/2018  . IR NEPHROSTOMY EXCHANGE RIGHT  10/27/2018  . IR NEPHROSTOMY EXCHANGE RIGHT  11/21/2018  . IR NEPHROSTOMY EXCHANGE RIGHT  01/05/2019  . IR NEPHROSTOMY PLACEMENT LEFT  10/02/2017  . IR RADIOLOGIST EVAL & MGMT  05/03/2017  . IR US GUIDE VASC ACCESS LEFT   05/31/2017  . IR US GUIDE VASC ACCESS RIGHT  04/06/2017  . IR US GUIDE VASC ACCESS RIGHT  05/30/2017  . LAPAROSCOPIC CHOLECYSTECTOMY  1990  . LIPOSUCTION WITH LIPOFILLING Bilateral 04/16/2016   Procedure: LIPOSUCTION WITH LIPOFILLING TO BILATERAL CHEST;  Surgeon: Irene Limbo, MD;  Location: [place];  Service: Plastics;  Laterality: Bilateral;  . MASTECTOMY W/ SENTINEL NODE BIOPSY Bilateral 12/11/2015   Procedure: RIGHT PROPHYLACTIC MASTECTOMY, LEFT TOTAL MASTECTOMY WITH LEFT AXILLARY SENTINEL LYMPH NODE BIOPSY;  Surgeon: Stark Klein, MD;  Location: [place];  Service: General;  Laterality: Bilateral;  . OSTOMY N/A 11/19/2014   Procedure: OSTOMY;  Surgeon: Michael [name], MD;  [facility];  Service: General;  Laterality: N/A;  . PROCTOSCOPY N/A 04/01/2017   Procedure: RIDGE PROCTOSCOPY;  Surgeon: Michael [name], MD;  [facility];  Service: General;  Laterality: N/A;  . REMOVAL OF BILATERAL TISSUE EXPANDERS WITH PLACEMENT OF BILATERAL BREAST IMPLANTS Bilateral 04/16/2016   Procedure: REMOVAL OF BILATERAL TISSUE EXPANDERS WITH PLACEMENT OF BILATERAL BREAST IMPLANTS;  Surgeon: Irene Limbo, MD;  Location: [place];  Service: Plastics;  Laterality: Bilateral;  . ROBOTIC ASSISTED LAP VAGINAL HYSTERECTOMY N/A 11/19/2014   Procedure: ROBOTIC LYSIS OF ADHESIONS, CONVERTED TO LAPAROTOMY RADICAL UPPER VAGINECTOMY,LOW ANTERIOR BOWEL RESECTION, COLOSTOMY, BILATERAL URETERAL STENT PLACEMENT AND CYSTONOMY CLOSURE;  Surgeon: Everitt Amber, MD;  [facility];  Service: Gynecology;  Laterality: N/A;  . TISSUE EXPANDER FILLING Bilateral 12/26/2015   Procedure: EXPANSION OF BILATERAL CHEST  TISSUE EXPANDERS (60 mL- Right; 75 mL- Left);  Surgeon: Irene Limbo, MD;  Location: [place];  Service: Plastics;  Laterality: Bilateral;  . TONSILLECTOMY    . TOTAL ABDOMINAL HYSTERECTOMY  March 2006   [facility]   and Bilateral Salpingoophorectomy/  staging for Ovarian  cancer/  an  . XI ROBOTIC ASSISTED LOWER ANTERIOR RESECTION N/A 04/01/2017   Procedure: XI ROBOTIC VS LAPAROSCOPIC COLOSTOMY TAKEDOWN WITH LYSIS OF ADHESIONS.;  Surgeon: Michael [name], MD;  [facility];  Service: General;  Laterality: N/A;  ERAS PATHWAY    SOCIAL HISTORY: Social History   Tobacco Use  . Smoking status: Never Smoker  . Smokeless tobacco: Never Used  Substance Use Topics  . Alcohol use: Not Currently  . Drug use: No    FAMILY HISTORY: Family History  Problem Relation Age of Onset  . Cancer Mother 16       stomach ca  . Hypertension Mother   . Cancer Father 54       prostate ca  . Diabetes Father   . Heart disease Father        CABG  . Hypertension Father   . Hyperlipidemia Father   . Obesity Father   . Breast cancer Maternal Aunt        dx in her 48s  . Lymphoma Paternal Aunt   . Brain cancer Paternal Grandfather   . Ovarian cancer Other   . Diabetes Sister   . Hypertension Brother y-10  . Heart disease Brother        CABG  . Diabetes Brother    ROS: Review of Systems  Psychiatric/Behavioral: Positive for depression (emotional eating). Negative for suicidal ideas.       Negative for homicidal ideas.   PHYSICAL EXAM: Pt in no acute distress  RECENT LABS AND TESTS: BMET    Component Value Date/Time   NA 138 01/22/2019 1629   NA 142 11/29/2018   NA 141 01/24/2017 1228   K 4.3 01/22/2019 1629   K 4.6 01/24/2017 1228   CL 106 01/22/2019 1629   CO2 25 01/22/2019 1629   CO2 29 01/24/2017 1228   GLUCOSE 110 (H) 01/22/2019 1629   GLUCOSE 84 01/24/2017 1228   BUN 13 01/22/2019 1629   BUN 22 (A) 11/29/2018   BUN 22.2 01/24/2017 1228   CREATININE 1.35 (H) 01/22/2019 1629   CREATININE 1.98 (H) 03/27/2018 1324   CREATININE 0.8 01/24/2017 1228   CALCIUM 8.9 01/22/2019 1629   CALCIUM 10.2 01/24/2017 1228   GFRNONAA 41 (L) 01/22/2019 1629   GFRNONAA 26 (L) 03/27/2018 1324   GFRNONAA 78 12/16/2014 1530   GFRAA 48 (L) 01/22/2019 1629   GFRAA  30 (L) 03/27/2018 1324   GFRAA >89 12/16/2014 1530   Lab Results  Component Value Date   HGBA1C 7.9 (H) 09/01/2017   HGBA1C 5.3 04/01/2017   HGBA1C 5.8 (H) 09/05/2015   HGBA1C 6.8 (H) 11/21/2014   HGBA1C 6.3 (H) 11/07/2014   No results found for: INSULIN CBC    Component Value Date/Time   WBC 21.0 (H) 01/22/2019 0343   RBC 3.12 (L) 01/22/2019 0343   HGB 9.3 (L) 01/22/2019 0343   HGB 10.2 (L) 03/27/2018 1324   HGB 12.4 07/29/2017 1444   HCT 30.0 (L) 01/22/2019 0343   HCT 38.0 07/29/2017 1444   PLT 193 01/22/2019 0343   PLT 268 03/27/2018 1324   PLT 260 07/29/2017 1444   MCV 96.2 01/22/2019 0343   MCV 90.9 07/29/2017  1444   MCH 29.8 01/22/2019 0343   MCHC 31.0 01/22/2019 0343   RDW 14.1 01/22/2019 0343   RDW 15.5 (H) 07/29/2017 1444   LYMPHSABS 0.7 01/22/2019 0343   LYMPHSABS 0.8 (L) 07/29/2017 1444   MONOABS 0.6 01/22/2019 0343   MONOABS 1.0 (H) 07/29/2017 1444   EOSABS 0.3 01/22/2019 0343   EOSABS 0.0 07/29/2017 1444   BASOSABS 0.1 01/22/2019 0343   BASOSABS 0.0 07/29/2017 1444   Iron/TIBC/Ferritin/ %Sat    Component Value Date/Time   IRON 7 (L) 08/31/2017 0451   IRON 12 (L) 11/29/2014 1251   TIBC 164 (L) 08/31/2017 0451   TIBC 331 11/29/2014 1251   FERRITIN 27 11/29/2014 1251   IRONPCTSAT 4 (L) 08/31/2017 0451   IRONPCTSAT 4 (L) 11/29/2014 1251   Lipid Panel     Component Value Date/Time   CHOL 109 08/28/2015 1617   TRIG 89 04/18/2017 0427   HDL 43.80 08/28/2015 1617   CHOLHDL 2 08/28/2015 1617   VLDL 19.8 08/28/2015 1617   LDLCALC 45 08/28/2015 1617   Hepatic Function Panel     Component Value Date/Time   PROT 7.4 01/17/2019 0351   PROT 6.9 01/24/2017 1228   ALBUMIN 2.4 (L) 01/22/2019 0343   ALBUMIN 3.4 (L) 01/24/2017 1228   AST 24 01/17/2019 0351   AST 14 (L) 03/27/2018 1324   AST 16 01/24/2017 1228   ALT 53 (H) 01/17/2019 0351   ALT 18 03/27/2018 1324   ALT 14 01/24/2017 1228   ALKPHOS 305 (H) 01/17/2019 0351   ALKPHOS 90 01/24/2017 1228    BILITOT 0.6 01/17/2019 0351   BILITOT 0.2 (L) 03/27/2018 1324   BILITOT 0.34 01/24/2017 1228      Component Value Date/Time   TSH 2.408 08/30/2017 0623   TSH 0.63 08/28/2015 1617   Results for MARCHE, HOTTENSTEIN (MRN [id number]) as of 01/29/2019 14:20  Ref. Range 04/05/2018 11:45  Vitamin D, 25-Hydroxy Latest Ref Range: 30.0 - 100.0 ng/mL 40.8    I, Michaelene Song, am acting as Location manager for Dennard Nip, MD I have reviewed the above documentation for accuracy and completeness, and I agree with the above. -Dennard Nip, MD

## 2019-01-31 DIAGNOSIS — L89309 Pressure ulcer of unspecified buttock, unspecified stage: Secondary | ICD-10-CM | POA: Diagnosis not present

## 2019-01-31 DIAGNOSIS — E860 Dehydration: Secondary | ICD-10-CM | POA: Diagnosis not present

## 2019-01-31 DIAGNOSIS — T82594D Other mechanical complication of infusion catheter, subsequent encounter: Secondary | ICD-10-CM | POA: Diagnosis not present

## 2019-01-31 DIAGNOSIS — E785 Hyperlipidemia, unspecified: Secondary | ICD-10-CM | POA: Diagnosis not present

## 2019-01-31 DIAGNOSIS — T83592A Infection and inflammatory reaction due to indwelling ureteral stent, initial encounter: Secondary | ICD-10-CM | POA: Diagnosis not present

## 2019-02-06 DIAGNOSIS — E119 Type 2 diabetes mellitus without complications: Secondary | ICD-10-CM | POA: Diagnosis not present

## 2019-02-06 DIAGNOSIS — E782 Mixed hyperlipidemia: Secondary | ICD-10-CM | POA: Diagnosis not present

## 2019-02-06 DIAGNOSIS — E039 Hypothyroidism, unspecified: Secondary | ICD-10-CM | POA: Diagnosis not present

## 2019-02-07 DIAGNOSIS — T83592A Infection and inflammatory reaction due to indwelling ureteral stent, initial encounter: Secondary | ICD-10-CM | POA: Diagnosis not present

## 2019-02-07 DIAGNOSIS — T82594D Other mechanical complication of infusion catheter, subsequent encounter: Secondary | ICD-10-CM | POA: Diagnosis not present

## 2019-02-07 DIAGNOSIS — E785 Hyperlipidemia, unspecified: Secondary | ICD-10-CM | POA: Diagnosis not present

## 2019-02-07 DIAGNOSIS — L89309 Pressure ulcer of unspecified buttock, unspecified stage: Secondary | ICD-10-CM | POA: Diagnosis not present

## 2019-02-07 DIAGNOSIS — E860 Dehydration: Secondary | ICD-10-CM | POA: Diagnosis not present

## 2019-02-07 IMAGING — XA IR EXCHANGE NEPHROSTOMY LEFT
4 series · 12 of 19 positions shown · non-contrast
Comparison: 01/05/2018

INDICATION: Need for exchange of indwelling bilateral percutaneous nephrostomy
tubes.

EXAM:
BILATERAL PERCUTANEOUS NEPHROSTOMY TUBE EXCHANGE

[Series 1: fl - angio · 3 of 11 frames shown (1 of 3)]
[frame 2/11]
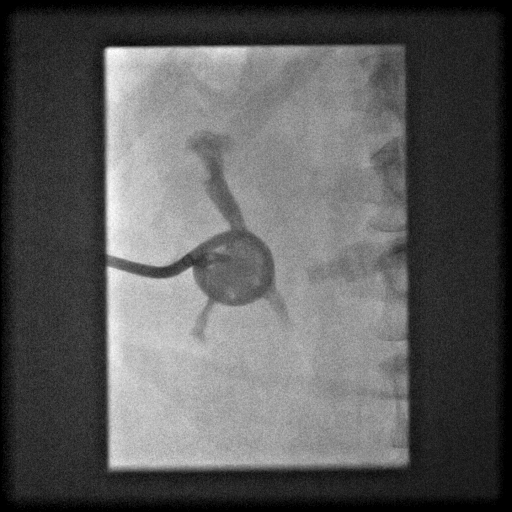
[frame 6/11]
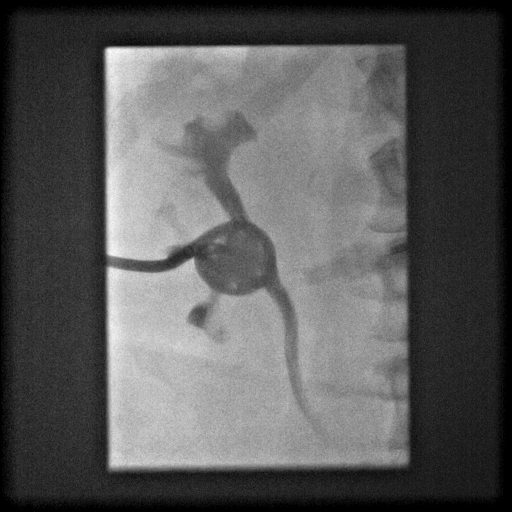
[frame 10/11]
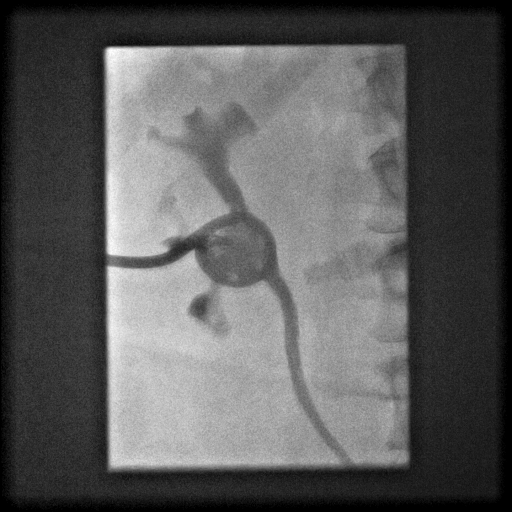

[Series 3: fl - angio · 2 of 40 frames shown (2 of 3)]
[frame 7/40]
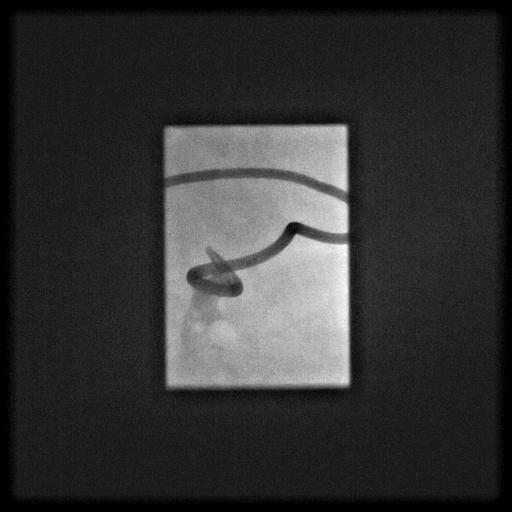
[frame 35/40]
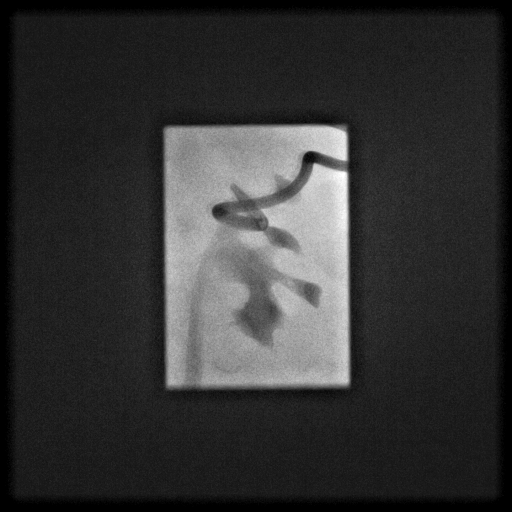

[Series 5: fl - angio · 3 of 24 frames shown (3 of 3)]
[frame 2/24]
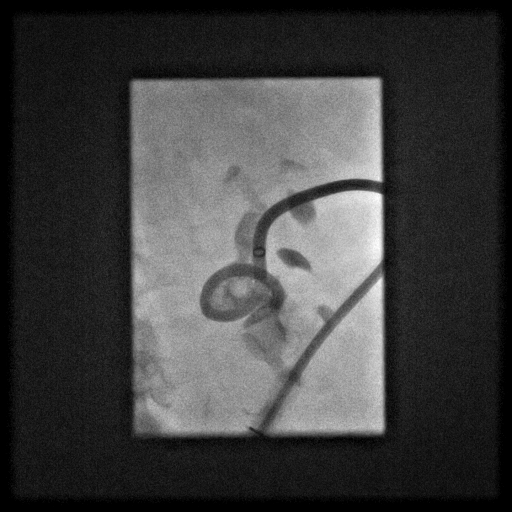
[frame 13/24]
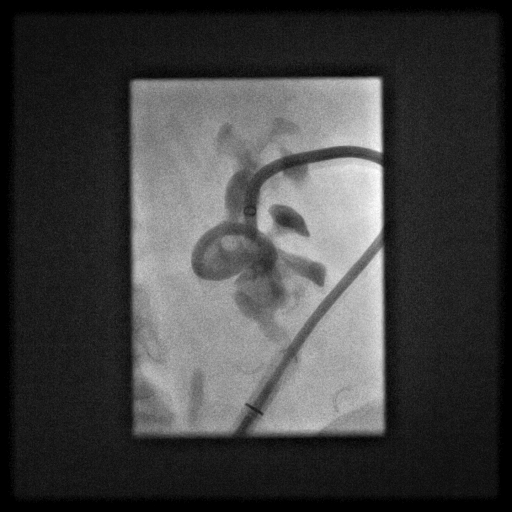
[frame 21/24]
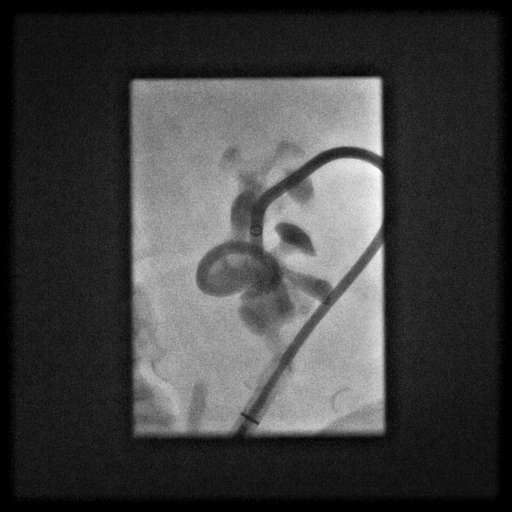

[Series 300: tube placements · 4 of 7 slices shown]
[im 2/7]
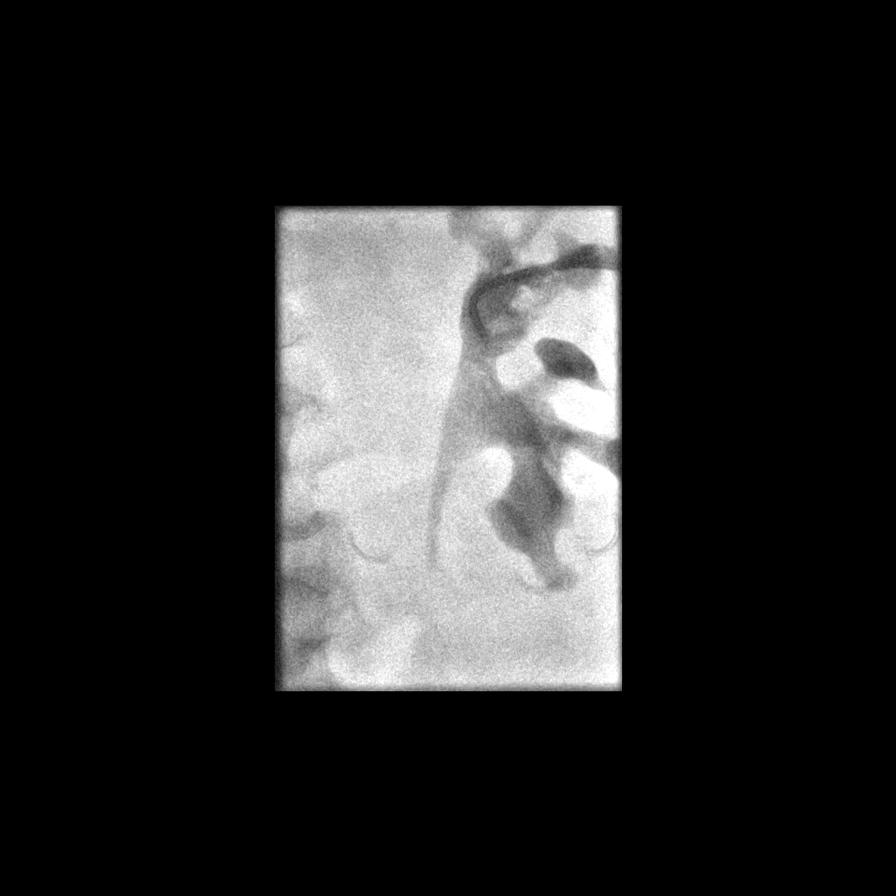
[im 4/7]
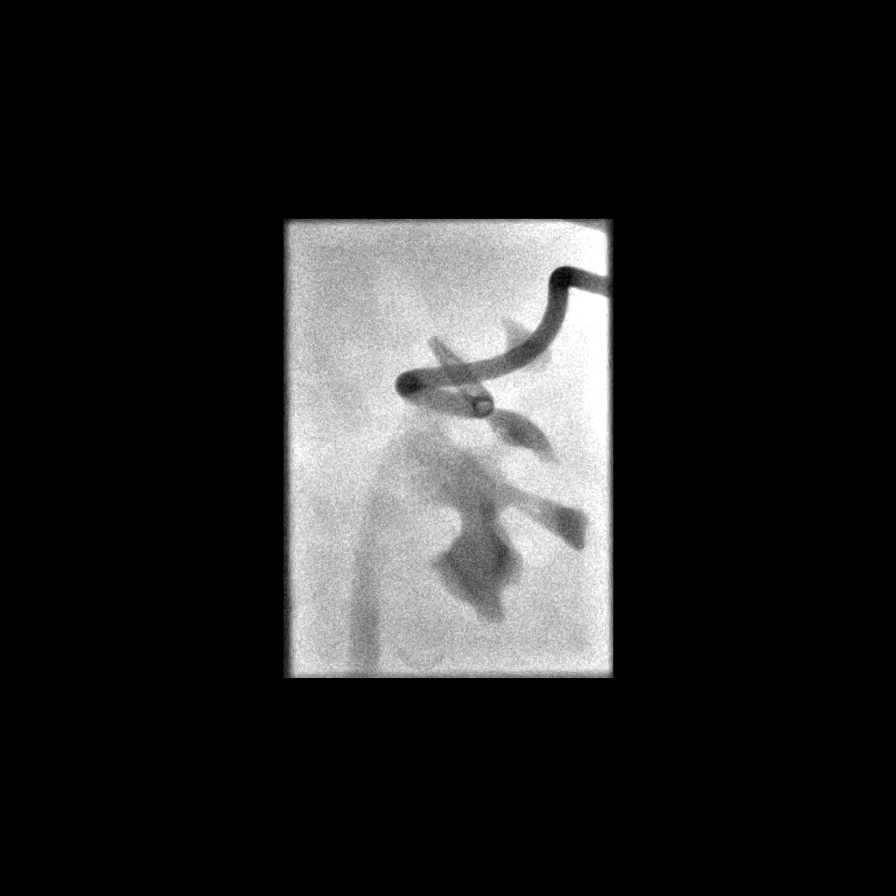
[im 5/7]
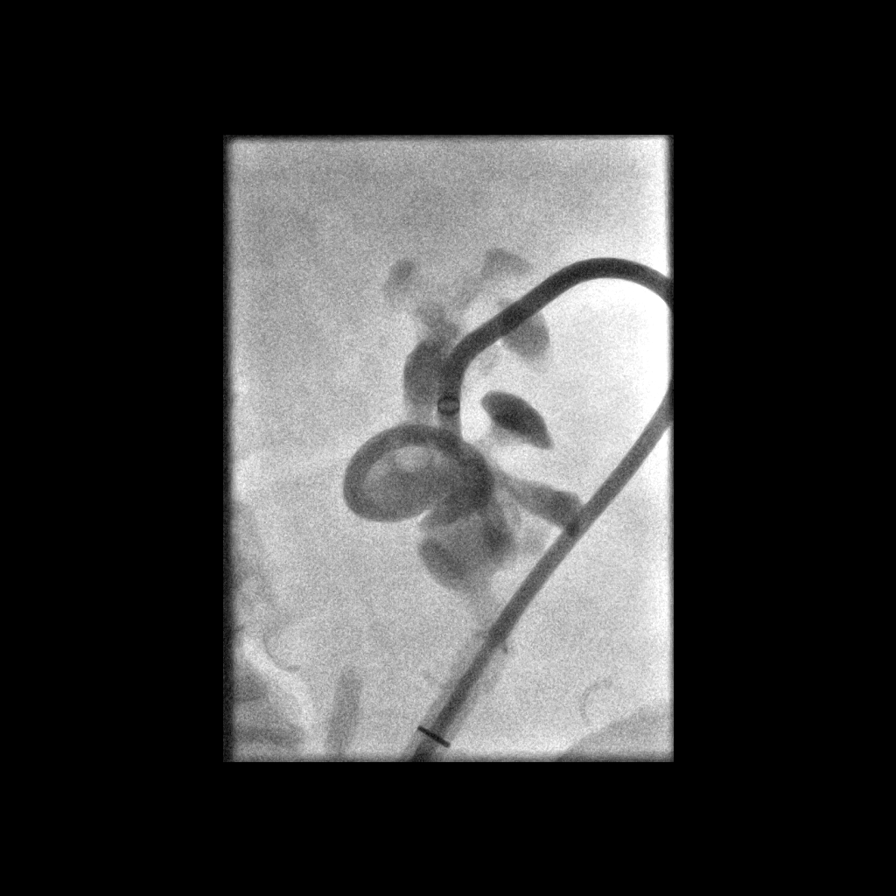
[im 7/7]
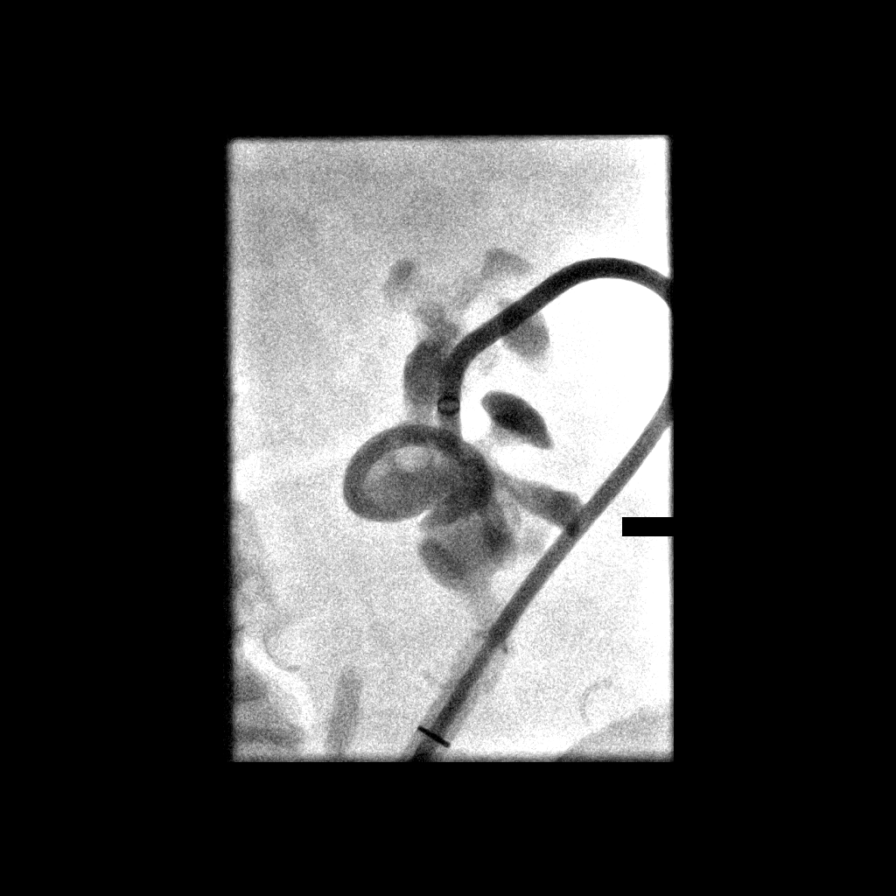

[12 of 19 positions shown; findings below may reference images not displayed]

MEDICATIONS:
None

ANESTHESIA/SEDATION:
None

CONTRAST:  15 mL [id number]-administered into the collecting
system(s)

FLUOROSCOPY TIME:  Fluoroscopy Time: 2 minutes and 6 seconds.
mGy.

COMPLICATIONS:
None immediate.

PROCEDURE:
Informed written consent was obtained from the patient after a
thorough discussion of the procedural risks, benefits and
alternatives. All questions were addressed. Maximal Sterile Barrier
Technique was utilized including caps, mask, sterile gowns, sterile
gloves, sterile drape, hand hygiene and skin antiseptic. A timeout
was performed prior to the initiation of the procedure.

Both nephrostomy tubes were injected with contrast material, cut and
removed over guidewires. New 12 French nephrostomy tubes were
advanced over guidewires and formed. Both were formed at the level
of the renal pelvis. Fluoroscopic spot images were obtained after
injection of contrast. Both were secured with 0 Prolene retention
sutures and adhesive retention devices. Both catheters were
connected to new gravity drainage bags.
IMPRESSION: Routine exchange of chronic indwelling bilateral 12 French
percutaneous nephrostomy tubes.

## 2019-02-08 DIAGNOSIS — E1122 Type 2 diabetes mellitus with diabetic chronic kidney disease: Secondary | ICD-10-CM | POA: Diagnosis not present

## 2019-02-08 DIAGNOSIS — I129 Hypertensive chronic kidney disease with stage 1 through stage 4 chronic kidney disease, or unspecified chronic kidney disease: Secondary | ICD-10-CM | POA: Diagnosis not present

## 2019-02-08 DIAGNOSIS — E11319 Type 2 diabetes mellitus with unspecified diabetic retinopathy without macular edema: Secondary | ICD-10-CM | POA: Diagnosis not present

## 2019-02-08 DIAGNOSIS — N183 Chronic kidney disease, stage 3 (moderate): Secondary | ICD-10-CM | POA: Diagnosis not present

## 2019-02-12 ENCOUNTER — Ambulatory Visit (INDEPENDENT_AMBULATORY_CARE_PROVIDER_SITE_OTHER): Payer: BC Managed Care – PPO | Admitting: Family Medicine

## 2019-02-13 ENCOUNTER — Telehealth: Payer: Self-pay | Admitting: *Deleted

## 2019-02-13 IMAGING — US US EXTREM  UP VENOUS*R*
1 series · 13 of 24 positions shown · non-contrast
Comparison: None.

CLINICAL DATA: Question clot around PICC line



[Series 1: us extrem up venous*right* · 13 of 31 slices shown]
[im 1/31]
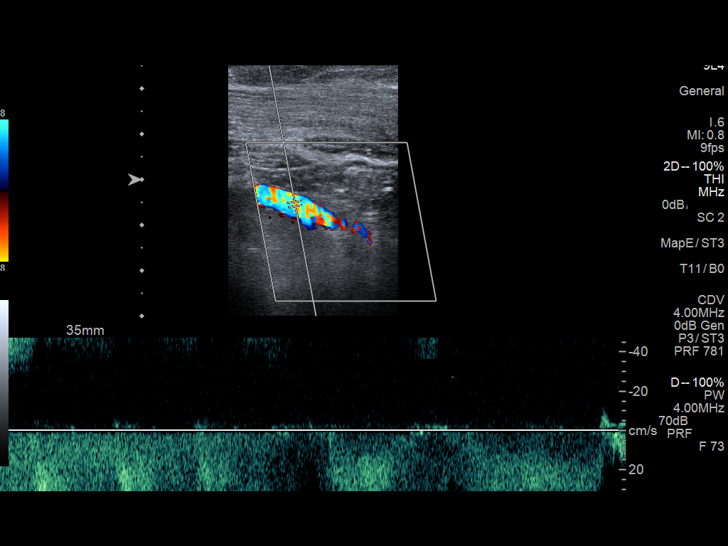
[im 3/31]
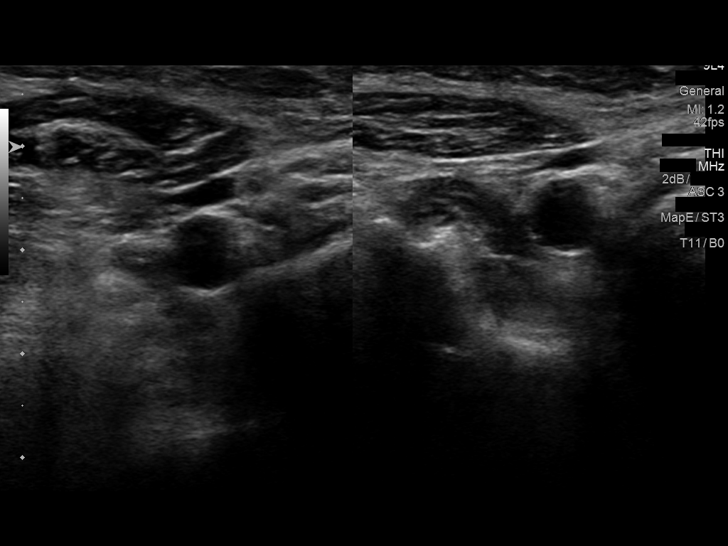
[im 6/31]
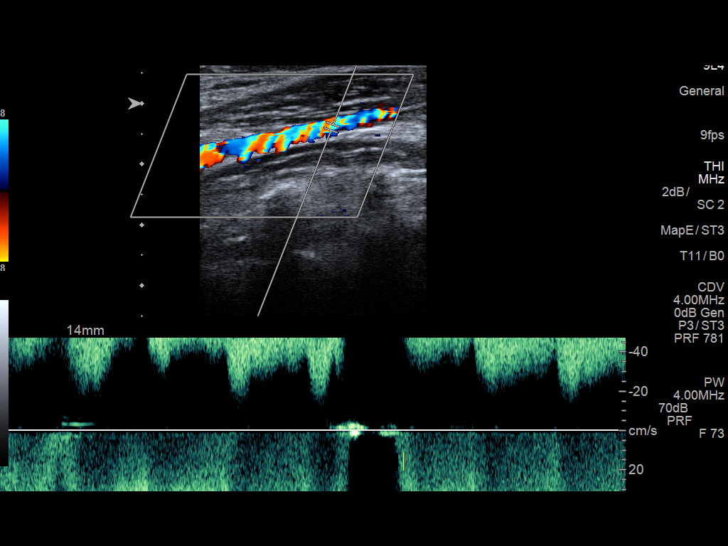
[im 8/31]
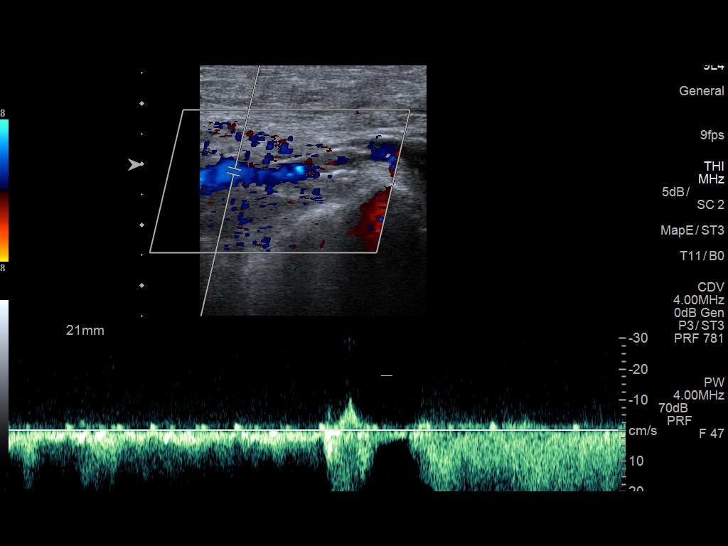
[im 11/31]
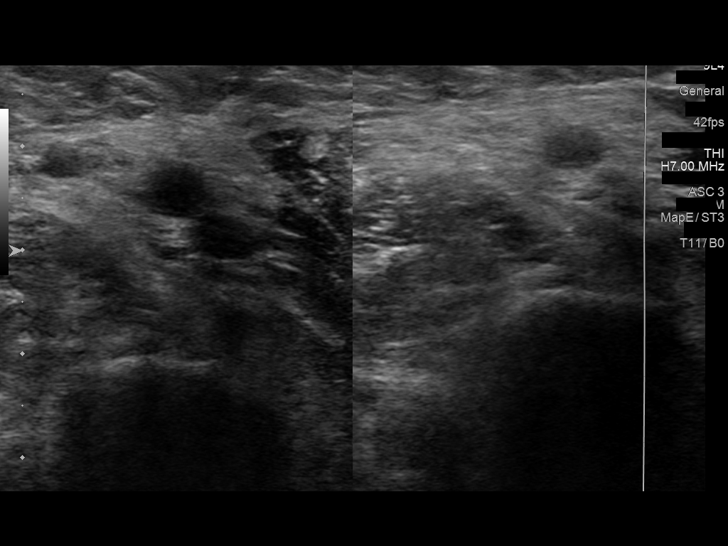
[im 14/31]
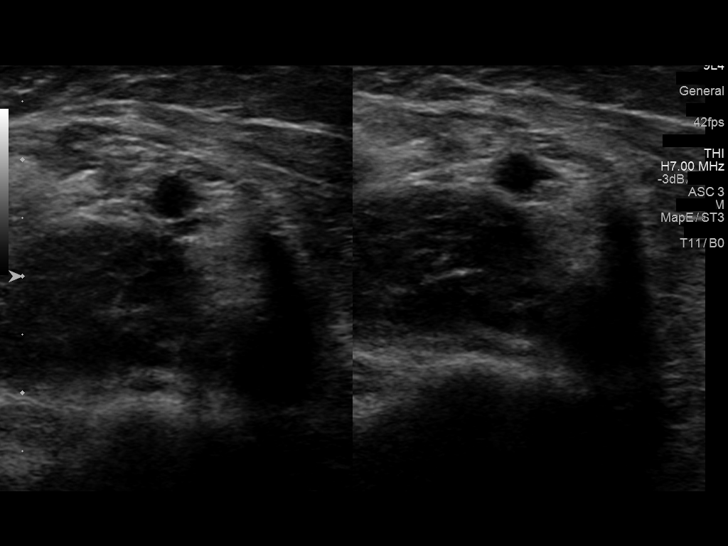
[im 16/31]
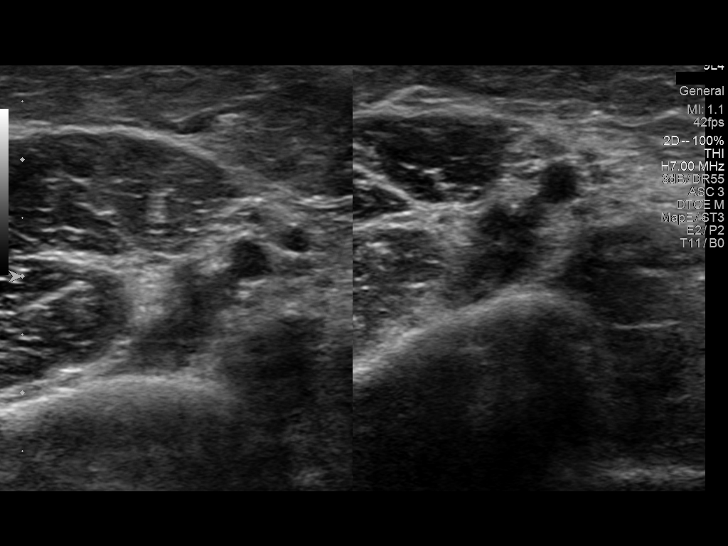
[im 17/31]
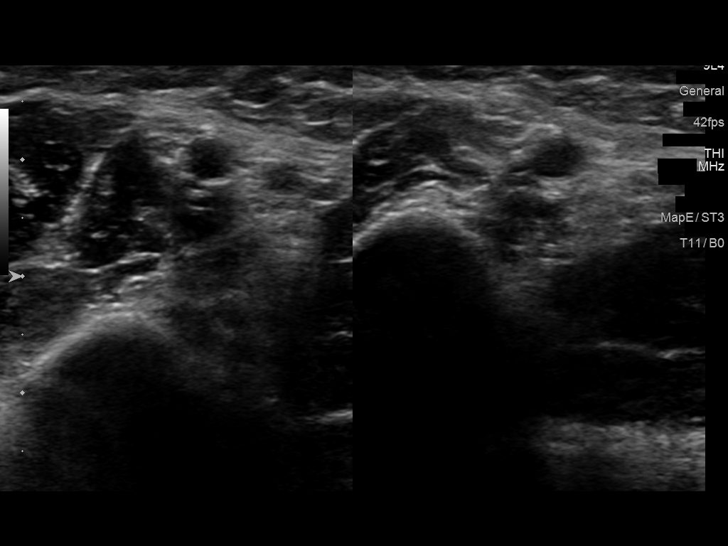
[im 20/31]
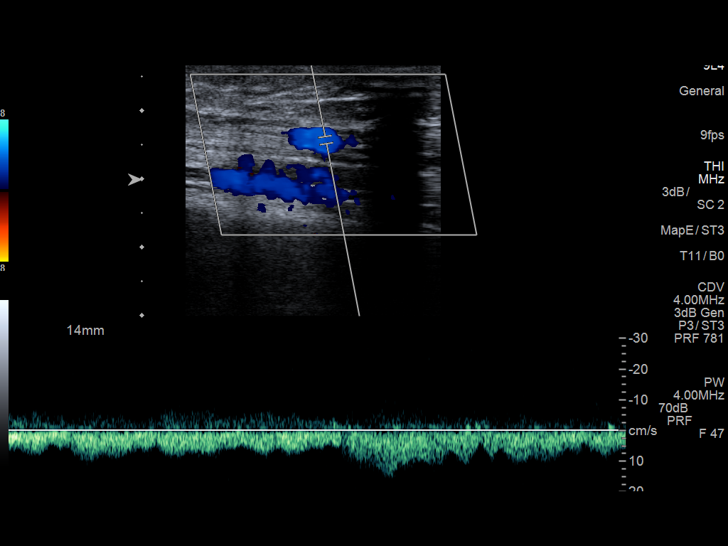
[im 23/31]
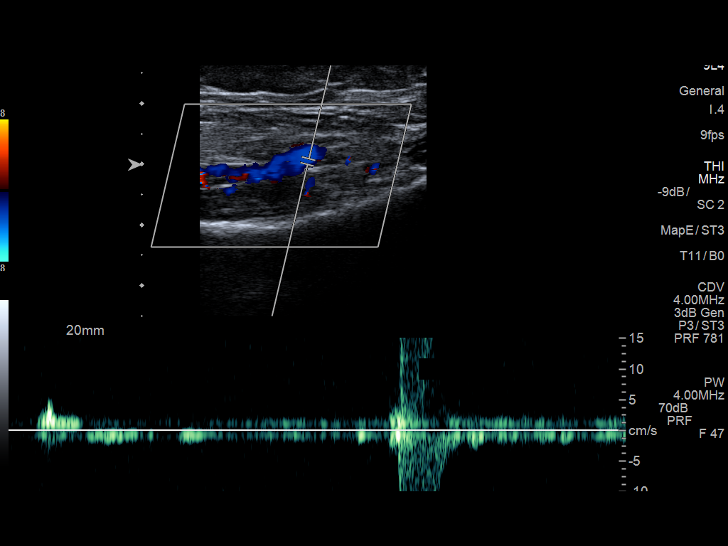
[im 25/31]
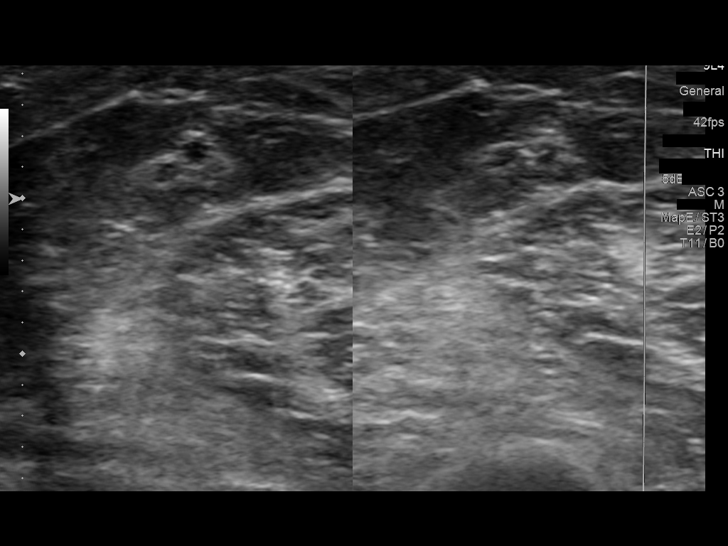
[im 28/31]
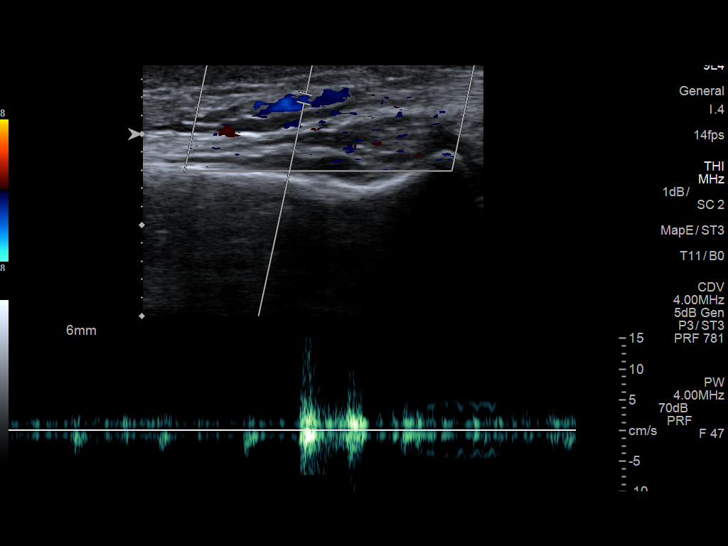
[im 31/31]
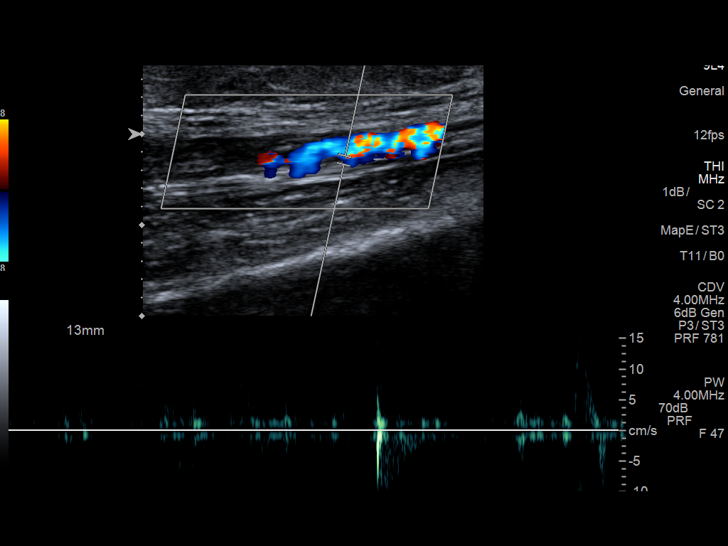

[13 of 24 positions shown; findings below may reference images not displayed]

FINDINGS: Contralateral Subclavian Vein: Respiratory phasicity is normal and
symmetric with the symptomatic side. No evidence of thrombus. Normal
compressibility.

Internal Jugular Vein: No evidence of thrombus. Normal
compressibility, respiratory phasicity and response to augmentation.

Subclavian Vein: No evidence of thrombus. Normal compressibility,
respiratory phasicity and response to augmentation.

Axillary Vein: No evidence of thrombus. Normal compressibility,
respiratory phasicity and response to augmentation.

Cephalic Vein: No evidence of thrombus. Normal compressibility,
respiratory phasicity and response to augmentation.

Basilic Vein: No evidence of thrombus. Normal compressibility,
respiratory phasicity and response to augmentation.

Brachial Veins: No evidence of thrombus. Normal compressibility,
respiratory phasicity and response to augmentation.

Radial Veins: No evidence of thrombus. Normal compressibility,
respiratory phasicity and response to augmentation.

Ulnar Veins: No evidence of thrombus. Normal compressibility,
respiratory phasicity and response to augmentation.

Venous Reflux:  None visualized.

Other Findings:  None visualized.
IMPRESSION: No evidence of DVT within the right upper extremity.

## 2019-02-13 NOTE — Telephone Encounter (Signed)
Please have her home nurse get urine specimens from both nephrostomy tubes and send each for urinalysis and culture.  I will make a decision about antibiotic therapy once the culture results are available.

## 2019-02-13 NOTE — Telephone Encounter (Signed)
Called patient to get more information and she advised she has right side pain, night sweats, white count is low and she just feels bad. Her nurse is coming tomorrow for routine labs and she wants to know if they need to do urine and what test to do on. Also she has her tubes replaced on Thursday 02/15/19 and the left side at opening is red and sore to touch. Advised her will let the provider know what is going on and give her a call with orders if he wants urine tomorrow.

## 2019-02-13 NOTE — Telephone Encounter (Signed)
RN Lott notified and she will get urine sample at [facility] 02/14/19 lab draw and have results faxed as soon as possible.

## 2019-02-13 NOTE — Telephone Encounter (Signed)
Patient notified of plan and is happy something is being done.

## 2019-02-13 NOTE — Telephone Encounter (Signed)
Patient called and states her home care RN Raoul Pitch [phone] states she has all the symptoms of a bad UTI. The patient is calling to see if Dr. Megan Salon wants to see her or send her in an antibiotic. Patient is requesting a call back [phone] please advise. Thank you

## 2019-02-14 ENCOUNTER — Other Ambulatory Visit: Payer: Self-pay | Admitting: Family Medicine

## 2019-02-14 ENCOUNTER — Encounter (INDEPENDENT_AMBULATORY_CARE_PROVIDER_SITE_OTHER): Payer: Self-pay | Admitting: Family Medicine

## 2019-02-14 ENCOUNTER — Other Ambulatory Visit: Payer: Self-pay

## 2019-02-14 ENCOUNTER — Telehealth (INDEPENDENT_AMBULATORY_CARE_PROVIDER_SITE_OTHER): Payer: BC Managed Care – PPO | Admitting: Family Medicine

## 2019-02-14 ENCOUNTER — Other Ambulatory Visit: Payer: Self-pay | Admitting: Radiology

## 2019-02-14 DIAGNOSIS — E1122 Type 2 diabetes mellitus with diabetic chronic kidney disease: Secondary | ICD-10-CM

## 2019-02-14 DIAGNOSIS — E669 Obesity, unspecified: Secondary | ICD-10-CM

## 2019-02-14 DIAGNOSIS — C55 Malignant neoplasm of uterus, part unspecified: Secondary | ICD-10-CM

## 2019-02-14 DIAGNOSIS — L89309 Pressure ulcer of unspecified buttock, unspecified stage: Secondary | ICD-10-CM | POA: Diagnosis not present

## 2019-02-14 DIAGNOSIS — T83592A Infection and inflammatory reaction due to indwelling ureteral stent, initial encounter: Secondary | ICD-10-CM | POA: Diagnosis not present

## 2019-02-14 DIAGNOSIS — Z6834 Body mass index (BMI) 34.0-34.9, adult: Secondary | ICD-10-CM

## 2019-02-14 DIAGNOSIS — N184 Chronic kidney disease, stage 4 (severe): Secondary | ICD-10-CM | POA: Diagnosis not present

## 2019-02-14 DIAGNOSIS — E785 Hyperlipidemia, unspecified: Secondary | ICD-10-CM | POA: Diagnosis not present

## 2019-02-14 DIAGNOSIS — T82594D Other mechanical complication of infusion catheter, subsequent encounter: Secondary | ICD-10-CM | POA: Diagnosis not present

## 2019-02-14 DIAGNOSIS — E860 Dehydration: Secondary | ICD-10-CM | POA: Diagnosis not present

## 2019-02-14 MED ORDER — HYDROCODONE-ACETAMINOPHEN 5-325 MG PO TABS: 2.0000 | ORAL_TABLET | Freq: Four times a day (QID) | ORAL | 0 refills | Status: DC | PRN

## 2019-02-14 NOTE — Telephone Encounter (Signed)
Hydrocodone refill.   Last OV: 01/04/2019 Last Fill: 01/08/2019 #30 and 0RF UDS: 02/17/2018 Low risk

## 2019-02-15 ENCOUNTER — Ambulatory Visit (HOSPITAL_COMMUNITY)
Admission: RE | Admit: 2019-02-15 | Discharge: 2019-02-15 | Disposition: A | Discharge status: Home / Self Care | Payer: BC Managed Care – PPO | Source: Ambulatory Visit | Attending: Interventional Radiology | Admitting: Interventional Radiology

## 2019-02-15 ENCOUNTER — Other Ambulatory Visit (HOSPITAL_COMMUNITY): Payer: Self-pay | Admitting: Interventional Radiology

## 2019-02-15 ENCOUNTER — Other Ambulatory Visit: Payer: Self-pay

## 2019-02-15 ENCOUNTER — Encounter (HOSPITAL_COMMUNITY): Payer: Self-pay

## 2019-02-15 DIAGNOSIS — T83022A Displacement of nephrostomy catheter, initial encounter: Secondary | ICD-10-CM | POA: Diagnosis not present

## 2019-02-15 DIAGNOSIS — C541 Malignant neoplasm of endometrium: Secondary | ICD-10-CM | POA: Diagnosis not present

## 2019-02-15 DIAGNOSIS — R109 Unspecified abdominal pain: Secondary | ICD-10-CM | POA: Diagnosis not present

## 2019-02-15 DIAGNOSIS — C569 Malignant neoplasm of unspecified ovary: Secondary | ICD-10-CM

## 2019-02-15 DIAGNOSIS — N132 Hydronephrosis with renal and ureteral calculous obstruction: Secondary | ICD-10-CM | POA: Diagnosis not present

## 2019-02-15 DIAGNOSIS — Z436 Encounter for attention to other artificial openings of urinary tract: Secondary | ICD-10-CM | POA: Diagnosis not present

## 2019-02-15 DIAGNOSIS — T8384XA Pain from genitourinary prosthetic devices, implants and grafts, initial encounter: Secondary | ICD-10-CM | POA: Diagnosis not present

## 2019-02-15 HISTORY — PX: IR NEPHROSTOMY EXCHANGE RIGHT: IMG6070

## 2019-02-15 HISTORY — PX: IR NEPHROSTOMY EXCHANGE LEFT: IMG6069

## 2019-02-15 LAB — CBC WITH DIFFERENTIAL/PLATELET
Abs Immature Granulocytes: 0.02 10*3/uL (ref 0.00–0.07)
Basophils Absolute: 0 10*3/uL (ref 0.0–0.1)
Basophils Relative: 1 %
Eosinophils Absolute: 0.2 10*3/uL (ref 0.0–0.5)
Eosinophils Relative: 4 %
HCT: 36 % (ref 36.0–46.0)
Hemoglobin: 10.6 g/dL — ABNORMAL LOW (ref 12.0–15.0)
Immature Granulocytes: 1 %
Lymphocytes Relative: 19 %
Lymphs Abs: 0.7 10*3/uL (ref 0.7–4.0)
MCH: 28.7 pg (ref 26.0–34.0)
MCHC: 29.4 g/dL — ABNORMAL LOW (ref 30.0–36.0)
MCV: 97.6 fL (ref 80.0–100.0)
Monocytes Absolute: 0.7 10*3/uL (ref 0.1–1.0)
Monocytes Relative: 18 %
Neutro Abs: 2.2 10*3/uL (ref 1.7–7.7)
Neutrophils Relative %: 57 %
Platelets: 291 10*3/uL (ref 150–400)
RBC: 3.69 MIL/uL — ABNORMAL LOW (ref 3.87–5.11)
RDW: 13.6 % (ref 11.5–15.5)
WBC: 3.8 10*3/uL — ABNORMAL LOW (ref 4.0–10.5)
nRBC: 0 % (ref 0.0–0.2)

## 2019-02-15 LAB — BASIC METABOLIC PANEL
Anion gap: 12 (ref 5–15)
BUN: 30 mg/dL — ABNORMAL HIGH (ref 8–23)
CO2: 26 mmol/L (ref 22–32)
Calcium: 9.4 mg/dL (ref 8.9–10.3)
Chloride: 99 mmol/L (ref 98–111)
Creatinine, Ser: 1.55 mg/dL — ABNORMAL HIGH (ref 0.44–1.00)
GFR calc Af Amer: 41 mL/min — ABNORMAL LOW (ref >60)
GFR calc non Af Amer: 35 mL/min — ABNORMAL LOW (ref >60)
Glucose, Bld: 143 mg/dL — ABNORMAL HIGH (ref 70–99)
Potassium: 4.1 mmol/L (ref 3.5–5.1)
Sodium: 137 mmol/L (ref 135–145)

## 2019-02-15 LAB — PROTIME-INR
INR: 1.1 (ref 0.8–1.2)
Prothrombin Time: 13.8 seconds (ref 11.4–15.2)

## 2019-02-15 LAB — GLUCOSE, CAPILLARY: Glucose-Capillary: 120 mg/dL — ABNORMAL HIGH (ref 70–99)

## 2019-02-15 MED ORDER — MIDAZOLAM HCL 2 MG/2ML IJ SOLN
INTRAMUSCULAR | Status: AC | PRN
  Administered 2019-02-15 (×2): 1 mg via INTRAVENOUS

## 2019-02-15 MED ORDER — FENTANYL CITRATE (PF) 100 MCG/2ML IJ SOLN
INTRAMUSCULAR | Status: AC | PRN
Start: 2019-02-15 — End: 2019-02-15
  Administered 2019-02-15 (×2): 50 ug via INTRAVENOUS

## 2019-02-15 MED ORDER — LIDOCAINE HCL 1 % IJ SOLN
INTRAMUSCULAR | Status: AC
  Filled 2019-02-15: qty 20

## 2019-02-15 MED ORDER — SODIUM CHLORIDE 0.9 % IV SOLN
INTRAVENOUS | Status: DC
  Administered 2019-02-15: 13:00:00 via INTRAVENOUS

## 2019-02-15 MED ORDER — HEPARIN SOD (PORK) LOCK FLUSH 100 UNIT/ML IV SOLN
250.0000 [IU] | INTRAVENOUS | Status: AC | PRN
  Administered 2019-02-15: 250 [IU]
  Filled 2019-02-15: qty 5

## 2019-02-15 MED ORDER — FENTANYL CITRATE (PF) 100 MCG/2ML IJ SOLN
INTRAMUSCULAR | Status: AC
  Filled 2019-02-15: qty 2

## 2019-02-15 MED ORDER — MIDAZOLAM HCL 2 MG/2ML IJ SOLN
INTRAMUSCULAR | Status: AC
  Filled 2019-02-15: qty 4

## 2019-02-15 MED ORDER — IOHEXOL 300 MG/ML  SOLN
50.0000 mL | Freq: Once | INTRAMUSCULAR | Status: AC | PRN
  Administered 2019-02-15: 15 mL

## 2019-02-15 NOTE — H&P (Signed)
Chief Complaint: Patient was seen in consultation today for ureteral obstruction s/p bilateral nephrostomy tube placement/bilateral nephrostomy tube exchanges.  Referring Physician(s): Alexis Frock  Supervising Physician: Sandi Mariscal  Patient Status: [place]  History of Present Illness: [name] is a 63 y.o. 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 She has chronic bilateral nephrostomy tubes placed due to urethral obstruction secondary to endometrial cancer, tubes first placed in IR 09/02/2017 by Dr. Earleen Newport. She has these tubes exchanged every 6-12 weeks, most recently exchanged in IR 01/05/2019 by Dr. Pascal Lux.  Patient presents today for possible image-guided bilateral percutaneous nephrostomy tube exchanges. Patient awake and alert laying in bed. Complains of right flank pain since Saturday. States that she thinks she has a urinary infection- states home health took UA/urine cultures, results pending. Denies fever, chills, chest pain, dyspnea, abdominal pain, or headache.   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+, HER2 negative/  12-11-2015 bilateral mastecotmy w/ reconstruction (no radiation and no chemo)   Cancer of corpus uteri, except isthmus [facility])  oncologist-- dr Denman [name] and dr Alvy Bimler    10-15-2004  dx endometroid endometrial and ovarian cancer s/p  chemotheapy and surgery(TAH w/ BSO) :   recurrent 11-19-2014 post pelvic surgery and radiation 01-29-2015 to 03-10-2015   Chronic idiopathic neutropenia ([place])    presumed related to chemotherapy March 2006--- followed by dr Alvy Bimler (treatment w/ G-CSF injections   Chronic nausea    Chronic pain    perineal/ anal  area from bladder pad irritates skin , right flank pain   CKD stage G2/A3, GFR 60-89 and albumin creatinine ratio >300 mg/g    nephrologist-  dr Madelon Lips   Colovesical fistula    Diabetic retinopathy, background ([place])    Difficult intravenous access    small veins--- hx PICC lines   DM type 2 (diabetes mellitus, type 2) ([place])    monitored by dr Legrand Como altheimer   Dysuria    Environmental and seasonal allergies    Fatty liver 02/08/2016   CT   Generalized muscle weakness    GERD (gastroesophageal reflux disease)    Hiatal hernia    History of abdominal abscess 04/16/2017   post surgery 04-01-2017  --- resolved 10/ 2018   History of gastric polyp    2014  duodenum   History of ileus 04/16/2017   resolved w/ no surgical intervention   History of radiation therapy    01-29-2015 to 03-10-2015  pelvis 50.4Gy   Hypothyroidism    monitored by dr Legrand Como altheimer   IBS (irritable bowel syndrome)    Ileostomy in place ([place]) 04/01/2017   created at same time colostomy takedown.   Joint pain    Leg edema    Lower urinary tract symptoms (LUTS)    urge urinary  incontinence   Mixed dyslipidemia    Multiple thyroid nodules    Managed by Dr. Harlow Asa   Nephrostomy status [facility])  Palpitations    Pelvic abscess in female 04/16/2017   PONV (postoperative nausea and vomiting)    "scopolamine patch works for me"   Radiation-induced dermatitis    contact dermatitis , radiation completed, rash only on ankles now.   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   Vitamin D deficiency    Wears glasses     Past Surgical History:  Procedure Laterality Date   APPENDECTOMY     biopsy thyroid nodules     BREAST RECONSTRUCTION WITH PLACEMENT OF TISSUE EXPANDER AND FLEX HD (ACELLULAR HYDRATED DERMIS) Bilateral 12/11/2015   Procedure: BILATERAL BREAST RECONSTRUCTION WITH PLACEMENT OF TISSUE EXPANDERS;  Surgeon: Irene Limbo, MD;  Location: [facility];  Service: Plastics;  Laterality: Bilateral;   COLONOSCOPY WITH PROPOFOL N/A 08/21/2013   Procedure: COLONOSCOPY WITH PROPOFOL;  Surgeon: Cleotis Nipper, MD;  [facility];  Service: Endoscopy;  Laterality: N/A;   COLOSTOMY TAKEDOWN N/A 12/04/2014   Procedure: LAPROSCOPIC LYSIS OF ADHESIONS, SPLENIC MOBILIZATION, RELOCATION OF COLOSTOMY, DEBRIDEMENT INITIAL COLOSTOMY SITE;  Surgeon: Michael [name], MD;  [facility];  Service: General;  Laterality: N/A;   CYSTOGRAM N/A 06/01/2017   Procedure: CYSTOGRAM;  Surgeon: Alexis Frock, MD;  [facility];  Service: Urology;  Laterality: N/A;   CYSTOSCOPY W/ RETROGRADES Right 11/21/2015   Procedure: CYSTOSCOPY WITH RETROGRADE PYELOGRAM;  Surgeon: Alexis Frock, MD;  [facility];  Service: Urology;  Laterality: Right;   CYSTOSCOPY W/ URETERAL STENT PLACEMENT Right 11/21/2015   Procedure: CYSTOSCOPY WITH STENT REPLACEMENT;  Surgeon: Alexis Frock, MD;  [facility];  Service: Urology;  Laterality: Right;   CYSTOSCOPY W/ URETERAL STENT PLACEMENT Right 03/10/2016   Procedure: CYSTOSCOPY WITH STENT REPLACEMENT;  Surgeon: Alexis Frock, MD;  Location: [facility];  Service: Urology;  Laterality: Right;   CYSTOSCOPY W/ URETERAL STENT PLACEMENT Right 06/30/2016   Procedure: CYSTOSCOPY WITH RETROGRADE PYELOGRAM/URETERAL STENT EXCHANGE;  Surgeon: Alexis Frock, MD;  Location: [facility];  Service: Urology;  Laterality: Right;   CYSTOSCOPY W/ URETERAL STENT PLACEMENT N/A 06/01/2017     Procedure: CYSTOSCOPY WITH EXAM UNDER ANESTHESIA;  Surgeon: Alexis Frock, MD;  [facility];  Service: Urology;  Laterality: N/A;   CYSTOSCOPY W/ URETERAL STENT PLACEMENT Right 08/17/2017   Procedure: CYSTOSCOPY WITH RETROGRADE PYELOGRAM/URETERAL STENT REMOVAL;  Surgeon: Alexis Frock, MD;  Location[facility];  Service: Urology;  Laterality: Right;   CYSTOSCOPY WITH RETROGRADE PYELOGRAM, URETEROSCOPY AND STENT PLACEMENT Right 03/20/2015   Procedure: CYSTOSCOPY WITH RETROGRADE PYELOGRAM, URETEROSCOPY WITH BALLOON DILATION AND STENT PLACEMENT ON RIGHT;  Surgeon: Alexis Frock, MD;  Location: [facility];  Service: Urology;  Laterality: Right;   CYSTOSCOPY WITH RETROGRADE PYELOGRAM, URETEROSCOPY AND STENT PLACEMENT Right 05/02/2015   Procedure: CYSTOSCOPY WITH RIGHT RETROGRADE PYELOGRAM,  DIAGNOSTIC URETEROSCOPY AND STENT PULL ;  Surgeon: Alexis Frock, MD;  Location: [facility];  Service: Urology;  Laterality: Right;   CYSTOSCOPY WITH RETROGRADE PYELOGRAM, URETEROSCOPY AND STENT PLACEMENT Right 09/05/2015   Procedure: CYSTOSCOPY WITH RETROGRADE PYELOGRAM,  AND STENT PLACEMENT;  Surgeon: Alexis Frock, MD;  [facility];  Service: Urology;  Laterality: Right;   CYSTOSCOPY WITH RETROGRADE PYELOGRAM, URETEROSCOPY AND STENT PLACEMENT Right 04/01/2017   Procedure: CYSTOSCOPY WITH RETROGRADE PYELOGRAM, URETEROSCOPY AND STENT PLACEMENT;  Surgeon: Alexis Frock, MD;  [facility];  Service: Urology;  Laterality: Right;   CYSTOSCOPY WITH STENT PLACEMENT Right 10/27/2016   Procedure: CYSTOSCOPY WITH STENT CHANGE and right retrograde pyelogram;  Surgeon: Alexis Frock, MD;  Location: [facility];  Service: Urology;  Laterality: Right;   EUS N/A 10/02/2014   Procedure: LOWER ENDOSCOPIC ULTRASOUND (EUS);  Surgeon: Arta Silence, MD;  Location: [facility];  Service: Endoscopy;  Laterality: N/A;   EXCISION SOFT TISSUE MASS  RIGHT FOREMAN  12-08-2006   EYE SURGERY  as child   pytosis of eyelids repair   INCISION AND DRAINAGE OF WOUND Bilateral 12/26/2015   Procedure: DEBRIDEMENT OF BILATERAL MASTECTOMY FLAPS;  Surgeon: Irene Limbo, MD;  Location: [place];  Service: Plastics;  Laterality: Bilateral;   IR CV LINE INJECTION  05/31/2017   IR FLUORO GUIDE CV LINE LEFT  05/31/2017   IR FLUORO GUIDE CV LINE RIGHT  04/06/2017   IR FLUORO GUIDE CV MIDLINE PICC RIGHT  05/30/2017   IR NEPHROSTOGRAM LEFT INITIAL PLACEMENT  09/02/2017   IR NEPHROSTOGRAM LEFT THRU EXISTING ACCESS  11/29/2017   IR NEPHROSTOGRAM RIGHT INITIAL PLACEMENT  09/02/2017   IR NEPHROSTOGRAM RIGHT THRU EXISTING ACCESS  09/13/2017   IR NEPHROSTOGRAM RIGHT THRU EXISTING ACCESS  11/29/2017   IR NEPHROSTOMY EXCHANGE LEFT  11/28/2017   IR NEPHROSTOMY EXCHANGE LEFT  01/05/2018   IR NEPHROSTOMY EXCHANGE LEFT  02/16/2018   IR NEPHROSTOMY EXCHANGE LEFT  03/30/2018   IR NEPHROSTOMY EXCHANGE LEFT  05/12/2018   IR NEPHROSTOMY EXCHANGE LEFT  06/21/2018   IR NEPHROSTOMY EXCHANGE LEFT  08/04/2018   IR NEPHROSTOMY EXCHANGE LEFT  09/18/2018   IR NEPHROSTOMY EXCHANGE LEFT  10/09/2018   IR NEPHROSTOMY EXCHANGE LEFT  10/27/2018   IR NEPHROSTOMY EXCHANGE LEFT  11/21/2018   IR NEPHROSTOMY EXCHANGE LEFT  01/05/2019   IR NEPHROSTOMY EXCHANGE RIGHT  10/02/2017   IR NEPHROSTOMY EXCHANGE RIGHT  11/28/2017   IR NEPHROSTOMY EXCHANGE RIGHT  01/05/2018   IR NEPHROSTOMY EXCHANGE RIGHT  02/16/2018   IR NEPHROSTOMY EXCHANGE RIGHT  03/30/2018   IR NEPHROSTOMY EXCHANGE RIGHT  05/12/2018   IR NEPHROSTOMY EXCHANGE RIGHT  06/21/2018   IR NEPHROSTOMY EXCHANGE RIGHT  08/04/2018   IR NEPHROSTOMY EXCHANGE RIGHT  09/18/2018   IR NEPHROSTOMY EXCHANGE RIGHT  10/27/2018   IR NEPHROSTOMY EXCHANGE RIGHT  11/21/2018   IR NEPHROSTOMY EXCHANGE RIGHT  01/05/2019   IR NEPHROSTOMY PLACEMENT LEFT  10/02/2017   IR RADIOLOGIST EVAL & MGMT  05/03/2017   IR US GUIDE VASC ACCESS  LEFT  05/31/2017   IR US GUIDE VASC ACCESS RIGHT  04/06/2017   IR US GUIDE VASC ACCESS RIGHT  05/30/2017   LAPAROSCOPIC CHOLECYSTECTOMY  1990   LIPOSUCTION WITH LIPOFILLING Bilateral 04/16/2016   Procedure: LIPOSUCTION WITH LIPOFILLING TO BILATERAL CHEST;  Surgeon: Irene Limbo, MD;  Location: [place];  Service: Plastics;  Laterality: Bilateral;   MASTECTOMY W/ SENTINEL NODE BIOPSY Bilateral 12/11/2015   Procedure: RIGHT PROPHYLACTIC MASTECTOMY, LEFT TOTAL MASTECTOMY WITH LEFT AXILLARY SENTINEL LYMPH NODE BIOPSY;  Surgeon: Stark Klein, MD;  Location: [place];  Service: General;  Laterality: Bilateral;   OSTOMY N/A 11/19/2014   Procedure: OSTOMY;  Surgeon: Michael [name], MD;  [facility];  Service: General;  Laterality: N/A;   PROCTOSCOPY N/A 04/01/2017   Procedure: RIDGE PROCTOSCOPY;  Surgeon: Michael [name], MD;  [facility];  Service: General;  Laterality: N/A;   REMOVAL OF BILATERAL TISSUE EXPANDERS WITH PLACEMENT OF BILATERAL BREAST IMPLANTS Bilateral 04/16/2016   Procedure: REMOVAL  OF BILATERAL TISSUE EXPANDERS WITH PLACEMENT OF BILATERAL BREAST IMPLANTS;  Surgeon: Irene Limbo, MD;  Location: [place];  Service: Plastics;  Laterality: Bilateral;   ROBOTIC ASSISTED LAP VAGINAL HYSTERECTOMY N/A 11/19/2014   Procedure: ROBOTIC LYSIS OF ADHESIONS, CONVERTED TO LAPAROTOMY RADICAL UPPER VAGINECTOMY,LOW ANTERIOR BOWEL RESECTION, COLOSTOMY, BILATERAL URETERAL STENT PLACEMENT AND CYSTONOMY CLOSURE;  Surgeon: Everitt Amber, MD;  [facility];  Service: Gynecology;  Laterality: N/A;   TISSUE EXPANDER FILLING Bilateral 12/26/2015   Procedure: EXPANSION OF BILATERAL CHEST TISSUE EXPANDERS (60 mL- Right; 75 mL- Left);  Surgeon: Irene Limbo, MD;  Location: [facility];  Service: Plastics;  Laterality: Bilateral;   TONSILLECTOMY     TOTAL ABDOMINAL HYSTERECTOMY  March 2006   [facility]   and Bilateral Salpingoophorectomy/  staging for  Ovarian cancer/  an   XI ROBOTIC ASSISTED LOWER ANTERIOR RESECTION N/A 04/01/2017   Procedure: XI ROBOTIC VS LAPAROSCOPIC COLOSTOMY TAKEDOWN WITH LYSIS OF ADHESIONS.;  Surgeon: Michael [name], MD;  [facility];  Service: General;  Laterality: N/A;  ERAS PATHWAY    Allergies: Penicillins, Cefaclor, Erythromycin, Tape, Trimethoprim, Ultram [tramadol], Cephalosporins, Fluconazole, Oxycodone, Pectin, Septra [sulfamethoxazole-trimethoprim], and Sulfa antibiotics  Medications: Prior to Admission medications   Medication Sig Start Date End Date Taking? Authorizing Provider  anastrozole (ARIMIDEX) 1 MG tablet Take 1 tablet (1 mg total) by mouth daily. 01/25/19  Yes Gorsuch, Ni, MD  Biotin 5 MG TABS Take 5 mg by mouth daily.    Yes [provider]  Cholecalciferol (VITAMIN D3) 10000 UNITS capsule Take 10,000 Units by mouth every Sunday.    Yes [provider]  clobetasol (OLUX) 0.05 % topical foam Apply topically 2 (two) times daily.   Yes [provider]  diphenoxylate-atropine (LOMOTIL) 2.5-0.025 MG tablet Take 1 tablet by mouth 4 (four) times daily as needed for diarrhea or loose stools. TO PREVENT LOOSE BOWEL MOVEMENTS 09/24/17  Yes Emokpae, Courage, MD  fentaNYL (DURAGESIC) 50 MCG/HR Place 1 patch onto the skin every 3 (three) days. 01/08/19  Yes [name] R, DO  ferrous sulfate 325 (65 FE) MG tablet Take 1 tablet (325 mg total) by mouth at bedtime. 09/24/17  Yes Emokpae, Courage, MD  filgrastim-sndz (ZARXIO) 480 MCG/0.8ML SOSY injection Inject 0.8 mLs (480 mcg total) into the skin as directed. Inject 480 mcg every 5 days 12/04/18 12/04/19 Yes Gorsuch, Ni, MD  HYDROcodone-acetaminophen (NORCO/VICODIN) 5-325 MG tablet Take 2 tablets by mouth every 6 (six) hours as needed for moderate pain. 02/14/19  Yes Lowne Chase, Yvonne R, DO  JANUVIA 50 MG tablet Take 50 mg by mouth daily.  10/28/17  Yes [provider]  levothyroxine (SYNTHROID, LEVOTHROID) 150 MCG tablet  Take 1 tablet (150 mcg total) by mouth daily before breakfast. 09/25/17  Yes Emokpae, Courage, MD  loratadine (CLARITIN) 10 MG tablet Take 10 mg by mouth daily.    Yes [provider]  nitrofurantoin, macrocrystal-monohydrate, (MACROBID) 100 MG capsule Take 100 mg by mouth at bedtime.   Yes [provider]  omega-3 acid ethyl esters (LOVAZA) 1 G capsule Take 1 g by mouth 2 (two) times daily.    Yes [provider]  Polyethyl Glycol-Propyl Glycol (SYSTANE OP) Place 1 drop into both eyes daily as needed (dry eyes).    Yes [provider]  Prenatal Vit-Fe Fumarate-FA (PRENATAL VITAMIN PO) Take 1 capsule by mouth daily. Takes prenatal because there are no dyes in it   Yes [provider]  rosuvastatin (CRESTOR)  10 MG tablet Take 10 mg by mouth every evening.    Yes [provider]  Saccharomyces boulardii (FLORASTOR PO) Take 1 capsule by mouth daily. 02/16/18  Yes [provider]  acetaminophen (TYLENOL) 325 MG tablet Take 2 tablets (650 mg total) by mouth every 6 (six) hours as needed. 09/24/17   Roxan Hockey, MD  diphenhydrAMINE (BENADRYL) 25 mg capsule Take 1 capsule (25 mg total) by mouth every 8 (eight) hours as needed for itching, allergies or sleep. 09/24/17   Roxan Hockey, MD     Family History  Problem Relation Age of Onset   Cancer Mother 30       stomach ca   Hypertension Mother    Cancer Father 37       prostate ca   Diabetes Father    Heart disease Father        CABG   Hypertension Father    Hyperlipidemia Father    Obesity Father    Breast cancer Maternal Aunt        dx in her 68s   Lymphoma Paternal 82    Brain cancer Paternal Grandfather    Ovarian cancer Other    Diabetes Sister    Hypertension Brother y-10   Heart disease Brother        CABG   Diabetes Brother     Social History   Socioeconomic History   Marital status: Married    Spouse name: Armed forces technical officer   Number of children:  1   Years of education: Not on file   Highest education level: Not on file  Occupational History   Occupation: retired Therapist, 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, sports - retired  Scientist, product/process development strain: Not on file   Food insecurity    Worry: Not on file    Inability: Not on Lexicographer needs    Medical: Not on file    Non-medical: Not on file  Tobacco Use   Smoking status: Never Smoker   Smokeless tobacco: Never Used  Substance and Sexual Activity   Alcohol use: Not Currently   Drug use: No   Sexual activity: Not on file  Lifestyle   Physical activity    Days per week: Not on file    Minutes per session: Not on file   Stress: Not on file  Relationships   Social connections    Talks on phone: Not on file    Gets together: Not on file    Attends religious service: Not on file    Active member of club or organization: Not on file    Attends meetings of clubs or organizations: Not on file    Relationship status: Not on file  Other Topics Concern   Not on file  Social History Narrative   Exercise-- has not gotten back into it since cancer came back     Review of Systems: A 12 point ROS discussed and pertinent positives are indicated in the HPI above.  All other systems are negative.  Review of Systems  Constitutional: Negative for chills and fever.  Respiratory: Negative for shortness of breath and wheezing.   Cardiovascular: Negative for chest pain and palpitations.  Gastrointestinal: Negative for abdominal pain.  Genitourinary: Positive for flank pain.  Neurological: Negative for headaches.  Psychiatric/Behavioral: Negative for behavioral problems and confusion.    Vital Signs: BP 132/68    Pulse 74    Temp 99 F (37.2 C) (Oral)  Resp 16    SpO2 100%   Physical Exam Vitals signs and nursing note reviewed.  Constitutional:      General: She is not in acute distress.    Appearance: Normal appearance.  Cardiovascular:     Rate and  Rhythm: Normal rate and regular rhythm.     Heart sounds: Normal heart sounds. No murmur.  Pulmonary:     Effort: Pulmonary effort is normal. No respiratory distress.     Breath sounds: Normal breath sounds. No wheezing.  Abdominal:     Palpations: Abdomen is soft.     Tenderness: There is no abdominal tenderness.     Comments: Ostomy intact. Bilateral nephrostomy tube sites c/d/i.  Skin:    General: Skin is warm and dry.  Neurological:     Mental Status: She is alert and oriented to person, place, and time.  Psychiatric:        Mood and Affect: Mood normal.        Behavior: Behavior normal.        Thought Content: Thought content normal.        Judgment: Judgment normal.      MD Evaluation Airway: WNL Heart: WNL Abdomen: WNL Chest/ Lungs: WNL ASA  Classification: 3 Mallampati/Airway Score: One   Imaging: Dg Abd 1 View  Result Date: 01/18/2019 CLINICAL DATA:  Small-bowel protocol. 24 hour delay after Gastrografin administration. EXAM: ABDOMEN - 1 VIEW COMPARISON:  01/17/2019 FINDINGS: Stable position of the bilateral nephrostomy tubes. Nasogastric tube in region of the stomach body. There continues to be gas-filled loops of small bowel in the mid abdomen. Oral contrast is not well demonstrated on this examination. Again noted are multiple surgical clips in the abdominopelvic region. Limited evaluation for free air on this supine image. IMPRESSION: Persistent gas-filled loops of bowel in the central abdomen. Bowel gas is less obvious on this examination and may be related to recent Gastrografin administration. Findings remain compatible with a small bowel obstruction. Electronically Signed   By: Markus Daft M.D.   On: 01/18/2019 12:04   Dg Abd 1 View  Result Date: 01/17/2019 CLINICAL DATA:  Replaced nasogastric tube EXAM: ABDOMEN - 1 VIEW COMPARISON:  Portable exam 1045 hours compared to 0727 hours FINDINGS: Tip of NG tube projects over stomach with proximal side-port projecting  just above gastroesophageal junction. Dilated loop of small bowel in the LEFT upper quadrant. BILATERAL for ostomy tubes present. Lung bases clear. IMPRESSION: Proximal side-port of nasogastric tube projects over distal esophagus, recommend advancing tube 2 cm. Electronically Signed   By: [name] M.D.   On: 01/17/2019 12:09   Ct Abdomen Pelvis W Contrast  Result Date: 01/17/2019 CLINICAL DATA:  Abdominal pain, unspecified. EXAM: CT ABDOMEN AND PELVIS WITH CONTRAST TECHNIQUE: Multidetector CT imaging of the abdomen and pelvis was performed using the standard protocol following bolus administration of intravenous contrast. CONTRAST:  3m OMNIPAQUE IOHEXOL 300 MG/ML  SOLN COMPARISON:  CT abdomen and pelvis 01/02/2019 FINDINGS: Lower chest: Lung bases are clear without focal nodule, mass, or airspace disease. The heart size is normal. No significant pleural or pericardial effusion is present. Hepatobiliary: Mild diffuse fatty infiltration of the liver is present. No discrete lesions are evident. There is mild intra and extrahepatic biliary dilation following cholecystectomy. The biliary ducts are slightly more prominent than on the prior study. No obstructing lesion is evident. Pancreas: Unremarkable. No pancreatic ductal dilatation or surrounding inflammatory changes. Spleen: Normal in size without focal abnormality. Adrenals/Urinary Tract: Adrenal glands are  normal bilaterally. Bilateral percutaneous nephrostomy catheters are in place. The collecting systems are decompressed. There is no stone or mass lesion in either kidney. Ureters are within normal limits. A Foley catheter is present in the urinary bladder. Stomach/Bowel: Stomach is within normal limits. Duodenum is unremarkable. Dilated loops of small bowel extend into the left upper quadrant. There are multiple small ventral hernias. The transition point appears to be just below the umbilicus. The obstruction is most likely due to adhesions. Although  there is a small ventral hernia, no definite strangulated bowel is evident. A loops of more distal decompressed bowel extends into a more superior left ventral hernia. This is chronic. The ostomy is decompressed. Residual colon is unremarkable. Vascular/Lymphatic: Presacral soft tissue is stable. Retroperitoneal dissection is noted. No new adenopathy is present. Reproductive: Postsurgical changes are noted. Other: No free fluid is present.  There is no free air. Musculoskeletal: Vertebral body heights and alignment are maintained. Slight degenerative anterolisthesis at L3-4 is again noted. There straightening of the normal lumbar lordosis. Bony pelvis is within normal limits. The hips are located and normal. IMPRESSION: 1. Small bowel obstruction. Transition is near the umbilicus, likely related to adhesions. No definite strangulated bowel is present. 2. Decompressed small bowel extends into a separate ventral hernia more superiorly and to the left. 3. Ostomy is decompressed. 4. Bilateral percutaneous nephrostomy drains in place. Collecting systems are decompressed. 5. Foley catheter in place. 6. Stable postsurgical changes of the pelvis. These results were called by telephone at the time of interpretation on 01/17/2019 at 4:30 am to [facility] , who verbally acknowledged these results. Electronically Signed   By: San Morelle M.D.   On: 01/17/2019 04:33   Dg Chest Port 1 View  Result Date: 01/19/2019 CLINICAL DATA:  history of endometrial and ovarian cancer status post surgery complicated with infection and presently patient has bilateral nephrostomy ileostomy and Foley catheter was recently treated for UTI with IV antibiotics until yesterday EXAM: PORTABLE CHEST 1 VIEW COMPARISON:  10/04/2018 and 11/29/2017 radiographs. FINDINGS: Enteric tube projects below the diaphragm, tip not visualized. Right arm PICC is unchanged at the mid SVC level. The heart size and mediastinal contours are stable. There  is mildly increased opacity at the left lung base, probably atelectasis. No pneumothorax or significant pleural effusion. IMPRESSION: Satisfactory position of the enteric tube. Mildly increased left basilar atelectasis. Electronically Signed   By: Richardean Sale M.D.   On: 01/19/2019 09:24   Dg Abd 2 Views  Result Date: 01/21/2019 CLINICAL DATA:  Small bowel obstruction. EXAM: ABDOMEN - 2 VIEW COMPARISON:  01/20/2019 FINDINGS: Nasogastric tube unchanged with tip over the stomach in the left upper quadrant. Bilateral percutaneous nephrostomy tubes unchanged. Single air-filled dilated small bowel loop in the left lower quadrant. Relative paucity of bowel gas throughout the remainder of the abdomen. No free peritoneal air. Multiple surgical clips over the midline lower abdomen/pelvis. Surgical clips in the right upper quadrant. Remainder the exam is unchanged. IMPRESSION: Persistent single air-filled mildly dilated small bowel loop in the left lower quadrant. Multiple tubes and lines as described. Electronically Signed   By: Marin Olp M.D.   On: 01/21/2019 10:42   Dg Abd Portable 1v  Result Date: 01/20/2019 CLINICAL DATA:  Small bowel obstruction. EXAM: PORTABLE ABDOMEN - 1 VIEW COMPARISON:  01/19/2019 FINDINGS: Relative paucity of bowel gas. Persistent mildly dilated small bowel loop in the left upper quadrant of the abdomen measures 3.3 cm transversely. Enteric catheter overlies the expected location  of the stomach. Bilateral PICC tail catheters are stable. Postsurgical changes in the pelvis. IMPRESSION: 1. Relative paucity of bowel gas. 2. Persistent mildly dilated small bowel loop in the left upper quadrant of the abdomen. Electronically Signed   By: Fidela Salisbury M.D.   On: 01/20/2019 15:29   Dg Abd Portable 1v  Result Date: 01/19/2019 CLINICAL DATA:  Followup small-bowel obstruction. EXAM: PORTABLE ABDOMEN - 1 VIEW COMPARISON:  01/18/2019 FINDINGS: Bilateral nephrostomy tubes are stable.  The NG tube is in the fundal region of the stomach and the proximal port is likely at or above the GE junction. Single loop of air distended small bowel in the left upper quadrant but overall slight improved bowel gas pattern. No free air. IMPRESSION: 1. The NG tube tip has pulled back into the fundal region. 2. Single slightly dilated air-filled loop of small bowel in the left upper quadrant but overall improved bowel gas pattern. Electronically Signed   By: Marijo Sanes M.D.   On: 01/19/2019 14:00   Dg Abd Portable 1v-small Bowel Obstruction Protocol-initial, 8 Hr Delay  Result Date: 01/17/2019 CLINICAL DATA:  Small bowel obstruction EXAM: PORTABLE ABDOMEN - 1 VIEW COMPARISON:  01/17/2019 FINDINGS: Bilateral nephrostomy catheters and NG tube remain in place, unchanged. Dilated small bowel loops noted in the abdomen compatible with small bowel obstruction. No significant change since prior study. Prior cholecystectomy. No organomegaly or visible free air. IMPRESSION: Stable small bowel obstruction pattern. Electronically Signed   By: Rolm Baptise M.D.   On: 01/17/2019 21:48   Dg Abd Portable 1v-small Bowel Protocol-position Verification  Result Date: 01/17/2019 CLINICAL DATA:  NG tube placement. EXAM: PORTABLE ABDOMEN - 1 VIEW COMPARISON:  One-view abdomen 01/17/2019 FINDINGS: Side port of the NG tube is now at the GE junction. Would advance 5 cm for more optimal positioning. Dilated loops of bowel are again noted in the left upper quadrant. The stomach is collapsed. Nephrostomy tubes are stable. IMPRESSION: 1. The NG tube scratched at the side port of the NG tube is at the GE junction. Would advance 5 cm for more optimal positioning. Electronically Signed   By: San Morelle M.D.   On: 01/17/2019 10:04   Dg Abd Portable 1 View  Result Date: 01/17/2019 CLINICAL DATA:  NG tube placement. EXAM: PORTABLE ABDOMEN - 1 VIEW COMPARISON:  CT of the abdomen and pelvis 01/17/2019 FINDINGS: Right-sided  PICC line terminates in the mid SVC. Heart size is exaggerated by low lung volumes.  Lungs are clear. Side port of the NG tube is in the fundus of the stomach. Dilated loops of small bowel are again noted in the left upper quadrant. Bilateral percutaneous nephrostomy tubes are noted. IMPRESSION: 1. Side port of the NG tube is in the fundus the stomach. 2. Persistent dilated small bowel in the left upper quadrant. Electronically Signed   By: San Morelle M.D.   On: 01/17/2019 05:54    Labs:  CBC: Recent Labs    01/17/19 0351 01/19/19 0339 01/22/19 0343 02/15/19 1312  WBC 13.7* 4.6 21.0* 3.8*  HGB 11.5* 9.5* 9.3* 10.6*  HCT 37.6 31.7* 30.0* 36.0  PLT 279 213 193 291    COAGS: Recent Labs    01/05/19 1010 02/15/19 1312  INR 0.9 1.1    BMP: Recent Labs    01/19/19 0339 01/22/19 0343 01/22/19 1629 02/15/19 1312  NA 147* 142 138 137  K 3.4* 2.9* 4.3 4.1  CL 110 107 106 99  CO2 29 25 25  26  GLUCOSE 80 81 110* 143*  BUN 20 13 13  30*  CALCIUM 8.9 8.4* 8.9 9.4  CREATININE 1.25* 1.27* 1.35* 1.55*  GFRNONAA 45* 45* 41* 35*  GFRAA 53* 52* 48* 41*    LIVER FUNCTION TESTS: Recent Labs    05/19/18 1127 09/28/18 1441 10/04/18 1345 11/29/18 01/17/19 0351 01/22/19 0343  BILITOT 0.4 0.4 0.5  --  0.6  --   AST 30 16 28  46* 24  --   ALT 54* 29 55* 70* 53*  --   ALKPHOS 364* 284* 550* 377* 305*  --   PROT 8.5* 8.6* 8.4*  --  7.4  --   ALBUMIN 2.8* 3.2* 3.0*  --  2.9* 2.4*     Assessment and Plan:  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 Earleen Newport (most recently bilaterally exchanged in IR 01/05/2019 by Dr. Pascal Lux). Plan for image-guided bilateral percutaneous nephrostomy tube exchanges today with Dr. Pascal Lux. Patient is NPO. Afebrile. She does not take blood thinners. INR 1.1 today.  Risks and benefits of bilateral percutaneous nephrostomy tube exchanges was discussed  with the patient including, but not limited to, infection, bleeding, significant bleeding causing loss or decrease in renal function or damage to adjacent structures. All of the patient's questions were answered, patient is agreeable to proceed. Consent signed and in chart.   Thank you for this interesting consult.  I greatly enjoyed meeting [name] and look forward to participating in their care.  A copy of this report was sent to the requesting provider on this date.  Electronically Signed: Earley Abide, PA-C 02/15/2019, 2:13 PM   I spent a total of 40 Minutes in face to face in clinical consultation, greater than 50% of which was counseling/coordinating care for ureteral obstruction s/p bilateral nephrostomy tube placement/bilateral nephrostomy tube exchanges.

## 2019-02-15 NOTE — Procedures (Signed)
Pre Procedure Dx: Hydronephrosis Post Procedure Dx: Same  Successful bilateral PCN exchange with sedation. Note, the right sided PCN was converted to a Bettey Mare type catheter per pt request.   EBL: None   No immediate complications.   Ronny Bacon, MD Pager #: [phone]

## 2019-02-15 NOTE — Discharge Instructions (Signed)
Percutaneous Nephrostomy Home Guide  Percutaneous nephrostomy is a procedure to insert a flexible tube into your kidney so that urine can leave your body. This procedure may be done if a medical condition prevents urine from leaving your kidney in the usual way. During the procedure, the nephrostomy tube is inserted in the right or left side of your lower back and is connected to an external drainage bag.  After you have a nephrostomy tube placed, urine will collect in the drainage bag outside of your body. You will need to empty and change the drainage bag as needed. You will also need to take steps to care for the area where the nephrostomy tube was inserted (tube insertion site).  How do I care for my nephrostomy tube?   Always keep your tubing, the leg bag, or the bedside drainage bag below the level of your kidney so that your urine drains freely.   Avoid activities that would cause bending or pulling of your tubing. Ask your health care provider what activities are safe for you.   When connecting your nephrostomy tube to a drainage bag, make sure that there are no kinks in the tubing and that your urine is draining freely. You may want to gently wrap an elastic bandage over the tubing. This will help keep the tubing in place and prevent it from kinking. Make sure there is no tension on the tubing so it does not become dislodged.   At night, you may want to connect your nephrostomy tube or the leg bag to a larger bedside drainage bag.  How do I empty the drainage bag?  Empty the leg bag or bedside drainage bag whenever it becomes ? full. Also empty it before you go to sleep. Most drainage bags have a drain at the bottom that allows urine to be emptied. Follow these basic steps:  1. Hold the drainage bag over a toilet or collection container. Use a measuring container if your health care provider told you to measure your urine.  2. Open the drain of the bag and allow the urine to drain out.  3. After all the  urine has drained from the drainage bag, close the drain fully.  4. Flush the urine down the toilet. If a collection container was used, rinse the container.  How do I change the dressing around the nephrostomy tube?  Change your dressing and clean your tube exit site as told by your health care provider. You may need to change the dressing every day for the first 2 weeks after having a nephrostomy tube inserted. After the first 2 weeks, you may be told to change the dressing two times a week.  Supplies needed:   Mild soap and water.   Split gauze pads, 4  4 inches (10 x 10 cm).   Gauze pads, 4  4 inches (10 x 10 cm).   Paper tape.  How to change the dressing:  Because of the location of your nephrostomy tube, you may need help from another person to complete dressing changes. Follow these basic steps:  1. Wash hands with soap and water.  2. Gently remove the tape and dressing from around the nephrostomy tube. Be careful not to pull on the tube while removing the dressing. Avoid using scissors because they may damage the tube.  3. Wash the skin around the tube with mild soap and water, rinse well, and pat the skin dry with a clean cloth.  4. Check the skin   that it is still anchored in the skin. 6. Place two split gauze pads in and around the tube exit site. Do not apply ointments or alcohol to the site. 7. Place a gauze pad on top of the split gauze pad. 8. Coil the tube on top of the gauze. The tubing should rest on the gauze, not on the skin. 9. Place tape around each edge of the gauze pad. 10. Secure the nephrostomy tubing. Make sure that the tube does not kink or become pinched. The tubing should rest on the gauze pad, not on the skin. 11. Dispose of used supplies properly.  How do I flush my nephrostomy tube? Use a saline syringe to rinse out (flush) your  nephrostomy tube as told by your health care provider. Flushing is easier if a three-way stopcock is placed between the tube and the drainage bag. One connection of the stopcock connects to your tube, the second connects to the drainage bag, and the third is usually covered with a cap. The lever on the stopcock points to the direction on the stopcock that is closed to flow. Normally, the lever points in the direction of the cap to allow urine to drain from the tube to the drainage bag. Supplies needed:  Rubbing alcohol wipe.  10 mL 0.9% saline syringe. How to flush the tube: 1. Move the lever of the three-way stopcock so it points toward the drainage bag. 2. Clean the cap with a rubbing alcohol wipe. 3. Screw the tip of a 10 mL 0.9% saline syringe onto the cap. 4. Using the syringe plunger, slowly push the 10 mL 0.9% saline in the syringe over 5-10 seconds. If resistance is met or pain occurs while pushing, stop pushing the saline. 5. Remove the syringe from the cap. 6. Return the stopcock lever to the usual position, pointing in the direction of the cap. 7. Dispose of used supplies properly. How do I replace the drainage bag? Replace the drainage bag, three-way stopcock, and any extension tubing as told by your health care provider. Make sure you always have an extra drainage bag and connecting tubing available. 1. Empty urine from your drainage bag. 2. Gather a new drainage bag, three-way stopcock, and any extension tubing. 3. Remove the drainage bag, three-way stopcock, and any extension tubing from the nephrostomy tube. 4. Attach the new leg bag or bedside drainage bag, three-way stopcock, and any extension tubing to the nephrostomy tube. 5. Dispose of the used drainage bag, three-way stopcock, and any extension. Contact a health care provider if:  You have problems with any of the valves or tubing.  You have persistent pain or soreness in your back.  You have more redness, swelling,  or pain around your tube insertion site.  You have more fluid or blood coming from your tube insertion site.  Your tube insertion site feels warm to the touch.  You have pus or a bad smell coming from your tube insertion site.  You have increased urine output or you feel burning when urinating. Get help right away if:  You have pain in your abdomen during the first week.  You have chest pain or have trouble breathing.  You have a new appearance of blood in your urine.  You have a fever or chills.  You have back pain that is not relieved by your medicine.  You have decreased urine output.  Your nephrostomy tube comes out. This information is not intended to replace advice given to you by  your health care provider. Make sure you discuss any questions you have with your health care provider. Document Released: 05/09/2013 Document Revised: 11/09/2018 Document Reviewed: 04/30/2016 Elsevier Patient Education  [place].    Moderate Conscious Sedation, Adult, Care After These instructions provide you with information about caring for yourself after your procedure. Your health care provider may also give you more specific instructions. Your treatment has been planned according to current medical practices, but problems sometimes occur. Call your health care provider if you have any problems or questions after your procedure. What can I expect after the procedure? After your procedure, it is common:  To feel sleepy for several hours.  To feel clumsy and have poor balance for several hours.  To have poor judgment for several hours.  To vomit if you eat too soon. Follow these instructions at home: For at least 24 hours after the procedure:   Do not: ? Participate in activities where you could fall or become injured. ? Drive. ? Use heavy machinery. ? Drink alcohol. ? Take sleeping pills or medicines that cause drowsiness. ? Make important decisions or sign legal  documents. ? Take care of children on your own.  Rest. Eating and drinking  Follow the diet recommended by your health care provider.  If you vomit: ? Drink water, juice, or soup when you can drink without vomiting. ? Make sure you have little or no nausea before eating solid foods. General instructions  Have a responsible adult stay with you until you are awake and alert.  Take over-the-counter and prescription medicines only as told by your health care provider.  If you smoke, do not smoke without supervision.  Keep all follow-up visits as told by your health care provider. This is important. Contact a health care provider if:  You keep feeling nauseous or you keep vomiting.  You feel light-headed.  You develop a rash.  You have a fever. Get help right away if:  You have trouble breathing. This information is not intended to replace advice given to you by your health care provider. Make sure you discuss any questions you have with your health care provider. Document Released: 05/09/2013 Document Revised: 07/01/2017 Document Reviewed: 11/08/2015 Elsevier Patient Education  2020 [employer].

## 2019-02-16 DIAGNOSIS — E860 Dehydration: Secondary | ICD-10-CM | POA: Diagnosis not present

## 2019-02-16 DIAGNOSIS — E785 Hyperlipidemia, unspecified: Secondary | ICD-10-CM | POA: Diagnosis not present

## 2019-02-16 DIAGNOSIS — N178 Other acute kidney failure: Secondary | ICD-10-CM | POA: Diagnosis not present

## 2019-02-16 DIAGNOSIS — L89309 Pressure ulcer of unspecified buttock, unspecified stage: Secondary | ICD-10-CM | POA: Diagnosis not present

## 2019-02-19 DIAGNOSIS — E860 Dehydration: Secondary | ICD-10-CM | POA: Diagnosis not present

## 2019-02-19 DIAGNOSIS — Z933 Colostomy status: Secondary | ICD-10-CM | POA: Diagnosis not present

## 2019-02-19 DIAGNOSIS — E785 Hyperlipidemia, unspecified: Secondary | ICD-10-CM | POA: Diagnosis not present

## 2019-02-19 DIAGNOSIS — L89319 Pressure ulcer of right buttock, unspecified stage: Secondary | ICD-10-CM | POA: Diagnosis not present

## 2019-02-19 DIAGNOSIS — L89309 Pressure ulcer of unspecified buttock, unspecified stage: Secondary | ICD-10-CM | POA: Diagnosis not present

## 2019-02-21 DIAGNOSIS — T83592A Infection and inflammatory reaction due to indwelling ureteral stent, initial encounter: Secondary | ICD-10-CM | POA: Diagnosis not present

## 2019-02-21 DIAGNOSIS — L89309 Pressure ulcer of unspecified buttock, unspecified stage: Secondary | ICD-10-CM | POA: Diagnosis not present

## 2019-02-21 DIAGNOSIS — T82594D Other mechanical complication of infusion catheter, subsequent encounter: Secondary | ICD-10-CM | POA: Diagnosis not present

## 2019-02-21 DIAGNOSIS — E860 Dehydration: Secondary | ICD-10-CM | POA: Diagnosis not present

## 2019-02-21 DIAGNOSIS — E785 Hyperlipidemia, unspecified: Secondary | ICD-10-CM | POA: Diagnosis not present

## 2019-02-21 DIAGNOSIS — N178 Other acute kidney failure: Secondary | ICD-10-CM | POA: Diagnosis not present

## 2019-02-21 NOTE — Progress Notes (Signed)
Office: [phone]  /  Fax: [phone] TeleHealth Visit:  [name] has verbally consented to this TeleHealth visit today. The patient is located at home, the provider is located at the [employer] [facility] office. The participants in this visit include the listed provider and patient. The visit was conducted today via [email or web address].  HPI:   Chief Complaint: OBESITY [name] is here to discuss her progress with her obesity treatment plan. She is keeping a food journal with 1300 calories and 70 grams of protein and is following her eating plan approximately 90% of the time. She states she is exercising 0 minutes 0 times per week. [name] states she weighed 189 lbs at home this week and is doing well with weight loss and journaling. She is being worked up for kidney pain and possible pyelonephritis. We were unable to weigh the patient today for this TeleHealth visit. She feels as if she has maintained her weight since her last visit. She has lost 18 lbs since starting treatment with [place].  Diabetes II [name] has a diagnosis of diabetes type II. [name] states her fasting blood sugars have ranged between 122 and 161 in the last week, which is higher than normal. Last A1c was reported to be 5.9 at her endocrinologist's office.She has been working on intensive lifestyle modifications including diet, exercise, and weight loss to help control her blood glucose levels.  ASSESSMENT AND PLAN:  Type 2 diabetes mellitus with stage 4 chronic kidney disease, without long-term current use of insulin (HCC)  Class 1 obesity with serious comorbidity and body mass index (BMI) of 34.0 to 34.9 in adult, unspecified obesity type  PLAN:  Diabetes II [name] has been given extensive diabetes education by myself today including ideal fasting and post-prandial blood glucose readings, individual ideal HgA1c goals  and hypoglycemia prevention. We discussed the importance of good blood sugar control to decrease the  likelihood of diabetic complications such as nephropathy, neuropathy, limb loss, blindness, coronary artery disease, and death. We discussed the importance of intensive lifestyle modification including diet, exercise and weight loss as the first line treatment for diabetes. Maanasa was advised blood sugars are likely increased due to infection and not due to medications. Will continue to monitor. She will let us know if blood sugar is greater than 180.  Obesity [name] is currently in the action stage of change. As such, her goal is to continue with weight loss efforts. She has agreed to keep a food journal with 1300 calories and 70 grams of protein.  [name] has been instructed to work up to a goal of 150 minutes of combined cardio and strengthening exercise per week for weight loss and overall health benefits. We discussed the following Behavioral Modification Strategies today: decreasing simple carbohydrates, increase H20 intake, and better snacking choices.  [name] has agreed to follow-up with our clinic in 2 weeks. She was informed of the importance of frequent follow-up visits to maximize her success with intensive lifestyle modifications for her multiple health conditions.  ALLERGIES: Allergies  Allergen Reactions   Penicillins Swelling    Facial swelling/childhood allergy Has patient had a PCN reaction causing immediate rash, facial/tongue/throat swelling, SOB or lightheadedness with hypotension: Yes Has patient had a PCN reaction causing severe rash involving mucus membranes or skin necrosis: Yes Has patient had a PCN reaction that required hospitalization yes Has patient had a PCN reaction occurring within the last 10 years: No If all of the above answers are "NO", then may proceed with  Cephalosporin use.    Cefaclor Rash    Ceclor   Erythromycin Other (See Comments)    Gastritis, abd cramps   Tape Rash    blisters   Trimethoprim Rash   Ultram [Tramadol] Hives   Cephalosporins  Rash   Fluconazole Rash   Oxycodone Other (See Comments)    " I just feel weird"   Pectin Rash    Pectin ring for stoma   Septra [Sulfamethoxazole-Trimethoprim] Rash   Sulfa Antibiotics Rash    MEDICATIONS: Current Outpatient Medications on File Prior to Visit  Medication Sig Dispense Refill   acetaminophen (TYLENOL) 325 MG tablet Take 2 tablets (650 mg total) by mouth every 6 (six) hours as needed. 30 tablet 1   anastrozole (ARIMIDEX) 1 MG tablet Take 1 tablet (1 mg total) by mouth daily. 90 tablet 3   Biotin 5 MG TABS Take 5 mg by mouth daily.      Cholecalciferol (VITAMIN D3) 10000 UNITS capsule Take 10,000 Units by mouth every Sunday.      clobetasol (OLUX) 0.05 % topical foam Apply topically 2 (two) times daily.     diphenhydrAMINE (BENADRYL) 25 mg capsule Take 1 capsule (25 mg total) by mouth every 8 (eight) hours as needed for itching, allergies or sleep. 30 capsule 0   diphenoxylate-atropine (LOMOTIL) 2.5-0.025 MG tablet Take 1 tablet by mouth 4 (four) times daily as needed for diarrhea or loose stools. TO PREVENT LOOSE BOWEL MOVEMENTS 30 tablet 0   fentaNYL (DURAGESIC) 50 MCG/HR Place 1 patch onto the skin every 3 (three) days. 10 patch 0   ferrous sulfate 325 (65 FE) MG tablet Take 1 tablet (325 mg total) by mouth at bedtime. 30 tablet 3   filgrastim-sndz (ZARXIO) 480 MCG/0.8ML SOSY injection Inject 0.8 mLs (480 mcg total) into the skin as directed. Inject 480 mcg every 5 days 0.8 mL 99   JANUVIA 50 MG tablet Take 50 mg by mouth daily.      levothyroxine (SYNTHROID, LEVOTHROID) 150 MCG tablet Take 1 tablet (150 mcg total) by mouth daily before breakfast. 30 tablet 1   loratadine (CLARITIN) 10 MG tablet Take 10 mg by mouth daily.      nitrofurantoin, macrocrystal-monohydrate, (MACROBID) 100 MG capsule Take 100 mg by mouth at bedtime.     omega-3 acid ethyl esters (LOVAZA) 1 G capsule Take 1 g by mouth 2 (two) times daily.      Polyethyl Glycol-Propyl Glycol  (SYSTANE OP) Place 1 drop into both eyes daily as needed (dry eyes).      Prenatal Vit-Fe Fumarate-FA (PRENATAL VITAMIN PO) Take 1 capsule by mouth daily. Takes prenatal because there are no dyes in it     rosuvastatin (CRESTOR) 10 MG tablet Take 10 mg by mouth every evening.      Saccharomyces boulardii (FLORASTOR PO) Take 1 capsule by mouth daily.     No current facility-administered medications on file prior to visit.     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+, HER2 negative/  12-11-2015 bilateral mastecotmy w/ reconstruction (no radiation and no chemo)   Cancer of corpus uteri, except isthmus [facility])  oncologist-- dr Denman [name] and dr Alvy Bimler  10-15-2004  dx endometroid endometrial and ovarian cancer s/p  chemotheapy and surgery(TAH w/ BSO) :  recurrent 11-19-2014 post pelvic surgery and radiation 01-29-2015 to 03-10-2015   Chronic idiopathic neutropenia ([place])    presumed related to chemotherapy March 2006--- followed by dr Alvy Bimler (treatment w/ G-CSF injections   Chronic nausea    Chronic pain    perineal/ anal  area from bladder pad irritates skin , right flank pain   CKD stage G2/A3, GFR 60-89 and albumin creatinine ratio >300 mg/g    nephrologist-  dr Madelon Lips   Colovesical fistula    Diabetic retinopathy, background ([place])    Difficult intravenous access    small veins--- hx PICC lines   DM type 2 (diabetes mellitus, type 2) ([place])    monitored by dr Legrand Como altheimer   Dysuria    Environmental and seasonal allergies    Fatty liver 02/08/2016   CT   Generalized muscle weakness    GERD (gastroesophageal reflux disease)    Hiatal hernia    History of abdominal abscess  04/16/2017   post surgery 04-01-2017  --- resolved 10/ 2018   History of gastric polyp    2014  duodenum   History of ileus 04/16/2017   resolved w/ no surgical intervention   History of radiation therapy    01-29-2015 to 03-10-2015  pelvis 50.4Gy   Hypothyroidism    monitored by dr Legrand Como altheimer   IBS (irritable bowel syndrome)    Ileostomy in place ([place]) 04/01/2017   created at same time colostomy takedown.   Joint pain    Leg edema    Lower urinary tract symptoms (LUTS)    urge urinary  incontinence   Mixed dyslipidemia    Multiple thyroid nodules    Managed by Dr. Harlow Asa   Nephrostomy status [facility])    Palpitations    Pelvic abscess in female 04/16/2017   PONV (postoperative nausea and vomiting)    "scopolamine patch works for me"   Radiation-induced dermatitis    contact dermatitis , radiation completed, rash only on ankles now.   SBO (small bowel obstruction) (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   Vitamin D deficiency    Wears glasses     PAST SURGICAL HISTORY: Past Surgical History:  Procedure Laterality Date   APPENDECTOMY     biopsy thyroid nodules     BREAST RECONSTRUCTION WITH PLACEMENT OF TISSUE EXPANDER AND FLEX HD (ACELLULAR HYDRATED DERMIS) Bilateral 12/11/2015   Procedure: BILATERAL BREAST RECONSTRUCTION WITH PLACEMENT OF TISSUE EXPANDERS;  Surgeon: Irene Limbo, MD;  Location: [place];  Service: Plastics;  Laterality: Bilateral;   COLONOSCOPY WITH PROPOFOL N/A 08/21/2013   Procedure: COLONOSCOPY WITH PROPOFOL;  Surgeon: Cleotis Nipper, MD;  [facility];  Service: Endoscopy;  Laterality: N/A;   COLOSTOMY TAKEDOWN N/A 12/04/2014   Procedure: LAPROSCOPIC LYSIS OF ADHESIONS, SPLENIC MOBILIZATION, RELOCATION OF COLOSTOMY, DEBRIDEMENT INITIAL COLOSTOMY SITE;  Surgeon: Michael [name], MD;   [facility];  Service: General;  Laterality: N/A;   CYSTOGRAM N/A 06/01/2017   Procedure: CYSTOGRAM;  Surgeon: Alexis Frock, MD;  [facility];  Service: Urology;  Laterality: N/A;   CYSTOSCOPY W/ RETROGRADES Right 11/21/2015   Procedure: CYSTOSCOPY WITH RETROGRADE PYELOGRAM;  Surgeon: Alexis Frock, MD;  [facility];  Service: Urology;  Laterality: Right;   CYSTOSCOPY W/ URETERAL STENT PLACEMENT Right  11/21/2015   Procedure: CYSTOSCOPY WITH STENT REPLACEMENT;  Surgeon: Alexis Frock, MD;  [facility];  Service: Urology;  Laterality: Right;   CYSTOSCOPY W/ URETERAL STENT PLACEMENT Right 03/10/2016   Procedure: CYSTOSCOPY WITH STENT REPLACEMENT;  Surgeon: Alexis Frock, MD;  Location: [facility];  Service: Urology;  Laterality: Right;   CYSTOSCOPY W/ URETERAL STENT PLACEMENT Right 06/30/2016   Procedure: CYSTOSCOPY WITH RETROGRADE PYELOGRAM/URETERAL STENT EXCHANGE;  Surgeon: Alexis Frock, MD;  Location: [facility];  Service: Urology;  Laterality: Right;   CYSTOSCOPY W/ URETERAL STENT PLACEMENT N/A 06/01/2017   Procedure: CYSTOSCOPY WITH EXAM UNDER ANESTHESIA;  Surgeon: Alexis Frock, MD;  [facility];  Service: Urology;  Laterality: N/A;   CYSTOSCOPY W/ URETERAL STENT PLACEMENT Right 08/17/2017   Procedure: CYSTOSCOPY WITH RETROGRADE PYELOGRAM/URETERAL STENT REMOVAL;  Surgeon: Alexis Frock, MD;  Location: [facility];  Service: Urology;  Laterality: Right;   CYSTOSCOPY WITH RETROGRADE PYELOGRAM, URETEROSCOPY AND STENT PLACEMENT Right 03/20/2015   Procedure: CYSTOSCOPY WITH RETROGRADE PYELOGRAM, URETEROSCOPY WITH BALLOON DILATION AND STENT PLACEMENT ON RIGHT;  Surgeon: Alexis Frock, MD;  Location: [facility];  Service: Urology;  Laterality: Right;   CYSTOSCOPY WITH RETROGRADE PYELOGRAM, URETEROSCOPY AND STENT PLACEMENT Right 05/02/2015   Procedure: CYSTOSCOPY WITH RIGHT RETROGRADE PYELOGRAM,   DIAGNOSTIC URETEROSCOPY AND STENT PULL ;  Surgeon: Alexis Frock, MD;  Location: [facility];  Service: Urology;  Laterality: Right;   CYSTOSCOPY WITH RETROGRADE PYELOGRAM, URETEROSCOPY AND STENT PLACEMENT Right 09/05/2015   Procedure: CYSTOSCOPY WITH RETROGRADE PYELOGRAM,  AND STENT PLACEMENT;  Surgeon: Alexis Frock, MD;  [facility];  Service: Urology;  Laterality: Right;   CYSTOSCOPY WITH RETROGRADE PYELOGRAM, URETEROSCOPY AND STENT PLACEMENT Right 04/01/2017   Procedure: CYSTOSCOPY WITH RETROGRADE PYELOGRAM, URETEROSCOPY AND STENT PLACEMENT;  Surgeon: Alexis Frock, MD;  [facility];  Service: Urology;  Laterality: Right;   CYSTOSCOPY WITH STENT PLACEMENT Right 10/27/2016   Procedure: CYSTOSCOPY WITH STENT CHANGE and right retrograde pyelogram;  Surgeon: Alexis Frock, MD;  [facility];  Service: Urology;  Laterality: Right;   EUS N/A 10/02/2014   Procedure: LOWER ENDOSCOPIC ULTRASOUND (EUS);  Surgeon: Arta Silence, MD;  Location: [facility];  Service: Endoscopy;  Laterality: N/A;   EXCISION SOFT TISSUE MASS RIGHT FOREMAN  12-08-2006   EYE SURGERY  as child   pytosis of eyelids repair   INCISION AND DRAINAGE OF WOUND Bilateral 12/26/2015   Procedure: DEBRIDEMENT OF BILATERAL MASTECTOMY FLAPS;  Surgeon: Irene Limbo, MD;  Location: [place];  Service: Plastics;  Laterality: Bilateral;   IR CV LINE INJECTION  05/31/2017   IR FLUORO GUIDE CV LINE LEFT  05/31/2017   IR FLUORO GUIDE CV LINE RIGHT  04/06/2017   IR FLUORO GUIDE CV MIDLINE PICC RIGHT  05/30/2017   IR NEPHROSTOGRAM LEFT INITIAL PLACEMENT  09/02/2017   IR NEPHROSTOGRAM LEFT THRU EXISTING ACCESS  11/29/2017   IR NEPHROSTOGRAM RIGHT INITIAL PLACEMENT  09/02/2017   IR NEPHROSTOGRAM RIGHT THRU EXISTING ACCESS  09/13/2017   IR NEPHROSTOGRAM RIGHT THRU EXISTING ACCESS  11/29/2017   IR NEPHROSTOMY EXCHANGE LEFT  11/28/2017   IR NEPHROSTOMY EXCHANGE LEFT   01/05/2018   IR NEPHROSTOMY EXCHANGE LEFT  02/16/2018   IR NEPHROSTOMY EXCHANGE LEFT  03/30/2018   IR NEPHROSTOMY EXCHANGE LEFT  05/12/2018   IR NEPHROSTOMY EXCHANGE LEFT  06/21/2018   IR NEPHROSTOMY EXCHANGE LEFT  08/04/2018   IR NEPHROSTOMY EXCHANGE LEFT  09/18/2018   IR NEPHROSTOMY  EXCHANGE LEFT  10/09/2018   IR NEPHROSTOMY EXCHANGE LEFT  10/27/2018   IR NEPHROSTOMY EXCHANGE LEFT  11/21/2018   IR NEPHROSTOMY EXCHANGE LEFT  01/05/2019   IR NEPHROSTOMY EXCHANGE LEFT  02/15/2019   IR NEPHROSTOMY EXCHANGE RIGHT  10/02/2017   IR NEPHROSTOMY EXCHANGE RIGHT  11/28/2017   IR NEPHROSTOMY EXCHANGE RIGHT  01/05/2018   IR NEPHROSTOMY EXCHANGE RIGHT  02/16/2018   IR NEPHROSTOMY EXCHANGE RIGHT  03/30/2018   IR NEPHROSTOMY EXCHANGE RIGHT  05/12/2018   IR NEPHROSTOMY EXCHANGE RIGHT  06/21/2018   IR NEPHROSTOMY EXCHANGE RIGHT  08/04/2018   IR NEPHROSTOMY EXCHANGE RIGHT  09/18/2018   IR NEPHROSTOMY EXCHANGE RIGHT  10/27/2018   IR NEPHROSTOMY EXCHANGE RIGHT  11/21/2018   IR NEPHROSTOMY EXCHANGE RIGHT  01/05/2019   IR NEPHROSTOMY EXCHANGE RIGHT  02/15/2019   IR NEPHROSTOMY PLACEMENT LEFT  10/02/2017   IR RADIOLOGIST EVAL & MGMT  05/03/2017   IR US GUIDE VASC ACCESS LEFT  05/31/2017   IR US GUIDE VASC ACCESS RIGHT  04/06/2017   IR US GUIDE VASC ACCESS RIGHT  05/30/2017   LAPAROSCOPIC CHOLECYSTECTOMY  1990   LIPOSUCTION WITH LIPOFILLING Bilateral 04/16/2016   Procedure: LIPOSUCTION WITH LIPOFILLING TO BILATERAL CHEST;  Surgeon: Irene Limbo, MD;  Location: [place];  Service: Plastics;  Laterality: Bilateral;   MASTECTOMY W/ SENTINEL NODE BIOPSY Bilateral 12/11/2015   Procedure: RIGHT PROPHYLACTIC MASTECTOMY, LEFT TOTAL MASTECTOMY WITH LEFT AXILLARY SENTINEL LYMPH NODE BIOPSY;  Surgeon: Stark Klein, MD;  Location: [place];  Service: General;  Laterality: Bilateral;   OSTOMY N/A 11/19/2014   Procedure: OSTOMY;  Surgeon: Michael [name], MD;  [facility];  Service: General;   Laterality: N/A;   PROCTOSCOPY N/A 04/01/2017   Procedure: RIDGE PROCTOSCOPY;  Surgeon: Michael [name], MD;  [facility];  Service: General;  Laterality: N/A;   REMOVAL OF BILATERAL TISSUE EXPANDERS WITH PLACEMENT OF BILATERAL BREAST IMPLANTS Bilateral 04/16/2016   Procedure: REMOVAL OF BILATERAL TISSUE EXPANDERS WITH PLACEMENT OF BILATERAL BREAST IMPLANTS;  Surgeon: Irene Limbo, MD;  Location: [place];  Service: Plastics;  Laterality: Bilateral;   ROBOTIC ASSISTED LAP VAGINAL HYSTERECTOMY N/A 11/19/2014   Procedure: ROBOTIC LYSIS OF ADHESIONS, CONVERTED TO LAPAROTOMY RADICAL UPPER VAGINECTOMY,LOW ANTERIOR BOWEL RESECTION, COLOSTOMY, BILATERAL URETERAL STENT PLACEMENT AND CYSTONOMY CLOSURE;  Surgeon: Everitt Amber, MD;  [facility];  Service: Gynecology;  Laterality: N/A;   TISSUE EXPANDER FILLING Bilateral 12/26/2015   Procedure: EXPANSION OF BILATERAL CHEST TISSUE EXPANDERS (60 mL- Right; 75 mL- Left);  Surgeon: Irene Limbo, MD;  Location: [place];  Service: Plastics;  Laterality: Bilateral;   TONSILLECTOMY     TOTAL ABDOMINAL HYSTERECTOMY  March 2006   [facility]   and Bilateral Salpingoophorectomy/  staging for Ovarian cancer/  an   XI ROBOTIC ASSISTED LOWER ANTERIOR RESECTION N/A 04/01/2017   Procedure: XI ROBOTIC VS LAPAROSCOPIC COLOSTOMY TAKEDOWN WITH LYSIS OF ADHESIONS.;  Surgeon: Michael [name], MD;  [facility];  Service: General;  Laterality: N/A;  ERAS PATHWAY    SOCIAL HISTORY: Social History   Tobacco Use   Smoking status: Never Smoker   Smokeless tobacco: Never Used  Substance Use Topics   Alcohol use: Not Currently   Drug use: No    FAMILY HISTORY: Family History  Problem Relation Age of Onset   Cancer Mother 76       stomach ca   Hypertension Mother    Cancer Father 96       prostate ca  Diabetes Father    Heart disease Father        CABG   Hypertension Father    Hyperlipidemia Father     Obesity Father    Breast cancer Maternal Aunt        dx in her 79s   Lymphoma Paternal 45    Brain cancer Paternal Grandfather    Ovarian cancer Other    Diabetes Sister    Hypertension Brother y-10   Heart disease Brother        CABG   Diabetes Brother    ROS: Review of Systems  Endo/Heme/Allergies:       Negative for hypoglycemia.   PHYSICAL EXAM: Pt in no acute distress  RECENT LABS AND TESTS: BMET    Component Value Date/Time   NA 137 02/15/2019 1312   NA 142 11/29/2018   NA 141 01/24/2017 1228   K 4.1 02/15/2019 1312   K 4.6 01/24/2017 1228   CL 99 02/15/2019 1312   CO2 26 02/15/2019 1312   CO2 29 01/24/2017 1228   GLUCOSE 143 (H) 02/15/2019 1312   GLUCOSE 84 01/24/2017 1228   BUN 30 (H) 02/15/2019 1312   BUN 22 (A) 11/29/2018   BUN 22.2 01/24/2017 1228   CREATININE 1.55 (H) 02/15/2019 1312   CREATININE 1.98 (H) 03/27/2018 1324   CREATININE 0.8 01/24/2017 1228   CALCIUM 9.4 02/15/2019 1312   CALCIUM 10.2 01/24/2017 1228   GFRNONAA 35 (L) 02/15/2019 1312   GFRNONAA 26 (L) 03/27/2018 1324   GFRNONAA 78 12/16/2014 1530   GFRAA 41 (L) 02/15/2019 1312   GFRAA 30 (L) 03/27/2018 1324   GFRAA >89 12/16/2014 1530   Lab Results  Component Value Date   HGBA1C 7.9 (H) 09/01/2017   HGBA1C 5.3 04/01/2017   HGBA1C 5.8 (H) 09/05/2015   HGBA1C 6.8 (H) 11/21/2014   HGBA1C 6.3 (H) 11/07/2014   No results found for: INSULIN CBC    Component Value Date/Time   WBC 3.8 (L) 02/15/2019 1312   RBC 3.69 (L) 02/15/2019 1312   HGB 10.6 (L) 02/15/2019 1312   HGB 10.2 (L) 03/27/2018 1324   HGB 12.4 07/29/2017 1444   HCT 36.0 02/15/2019 1312   HCT 38.0 07/29/2017 1444   PLT 291 02/15/2019 1312   PLT 268 03/27/2018 1324   PLT 260 07/29/2017 1444   MCV 97.6 02/15/2019 1312   MCV 90.9 07/29/2017 1444   MCH 28.7 02/15/2019 1312   MCHC 29.4 (L) 02/15/2019 1312   RDW 13.6 02/15/2019 1312   RDW 15.5 (H) 07/29/2017 1444   LYMPHSABS 0.7 02/15/2019 1312   LYMPHSABS  0.8 (L) 07/29/2017 1444   MONOABS 0.7 02/15/2019 1312   MONOABS 1.0 (H) 07/29/2017 1444   EOSABS 0.2 02/15/2019 1312   EOSABS 0.0 07/29/2017 1444   BASOSABS 0.0 02/15/2019 1312   BASOSABS 0.0 07/29/2017 1444   Iron/TIBC/Ferritin/ %Sat    Component Value Date/Time   IRON 7 (L) 08/31/2017 0451   IRON 12 (L) 11/29/2014 1251   TIBC 164 (L) 08/31/2017 0451   TIBC 331 11/29/2014 1251   FERRITIN 27 11/29/2014 1251   IRONPCTSAT 4 (L) 08/31/2017 0451   IRONPCTSAT 4 (L) 11/29/2014 1251   Lipid Panel     Component Value Date/Time   CHOL 109 08/28/2015 1617   TRIG 89 04/18/2017 0427   HDL 43.80 08/28/2015 1617   CHOLHDL 2 08/28/2015 1617   VLDL 19.8 08/28/2015 1617   LDLCALC 45 08/28/2015 1617   Hepatic Function Panel  Component Value Date/Time   PROT 7.4 01/17/2019 0351   PROT 6.9 01/24/2017 1228   ALBUMIN 2.4 (L) 01/22/2019 0343   ALBUMIN 3.4 (L) 01/24/2017 1228   AST 24 01/17/2019 0351   AST 14 (L) 03/27/2018 1324   AST 16 01/24/2017 1228   ALT 53 (H) 01/17/2019 0351   ALT 18 03/27/2018 1324   ALT 14 01/24/2017 1228   ALKPHOS 305 (H) 01/17/2019 0351   ALKPHOS 90 01/24/2017 1228   BILITOT 0.6 01/17/2019 0351   BILITOT 0.2 (L) 03/27/2018 1324   BILITOT 0.34 01/24/2017 1228      Component Value Date/Time   TSH 2.408 08/30/2017 0623   TSH 0.63 08/28/2015 1617   Results for KAIDYN, HERNANDES (MRN [id number]) as of 02/21/2019 07:08  Ref. Range 04/05/2018 11:45  Vitamin D, 25-Hydroxy Latest Ref Range: 30.0 - 100.0 ng/mL 40.8   I, [name], am acting as Location manager for Dennard Nip, MD I have reviewed the above documentation for accuracy and completeness, and I agree with the above. -Dennard Nip, MD

## 2019-02-22 ENCOUNTER — Encounter: Payer: Self-pay | Admitting: Family Medicine

## 2019-02-23 ENCOUNTER — Other Ambulatory Visit: Payer: Self-pay | Admitting: Family Medicine

## 2019-02-23 ENCOUNTER — Encounter: Payer: Self-pay | Admitting: Family Medicine

## 2019-02-23 DIAGNOSIS — C55 Malignant neoplasm of uterus, part unspecified: Secondary | ICD-10-CM

## 2019-02-23 DIAGNOSIS — E860 Dehydration: Secondary | ICD-10-CM | POA: Diagnosis not present

## 2019-02-23 DIAGNOSIS — L89309 Pressure ulcer of unspecified buttock, unspecified stage: Secondary | ICD-10-CM | POA: Diagnosis not present

## 2019-02-23 DIAGNOSIS — E785 Hyperlipidemia, unspecified: Secondary | ICD-10-CM | POA: Diagnosis not present

## 2019-02-23 MED ORDER — FENTANYL 50 MCG/HR TD PT72: 1.0000 | MEDICATED_PATCH | TRANSDERMAL | 0 refills | Status: DC

## 2019-02-23 NOTE — Telephone Encounter (Signed)
Requesting: Fentanyl patches Contract:  UDS: 02/17/2018, low risk, 08/20/18 Last OV: 11/21/2018 Next OV: 02/26/2019 Last Refill: 01/08/2019, #10--0 RF Database:   Please advise

## 2019-02-26 ENCOUNTER — Ambulatory Visit (INDEPENDENT_AMBULATORY_CARE_PROVIDER_SITE_OTHER): Payer: BC Managed Care – PPO | Admitting: Family Medicine

## 2019-02-26 ENCOUNTER — Encounter: Payer: Self-pay | Admitting: Family Medicine

## 2019-02-26 ENCOUNTER — Other Ambulatory Visit: Payer: Self-pay

## 2019-02-26 DIAGNOSIS — Z9889 Other specified postprocedural states: Secondary | ICD-10-CM | POA: Diagnosis not present

## 2019-02-26 DIAGNOSIS — E785 Hyperlipidemia, unspecified: Secondary | ICD-10-CM | POA: Diagnosis not present

## 2019-02-26 DIAGNOSIS — L89309 Pressure ulcer of unspecified buttock, unspecified stage: Secondary | ICD-10-CM | POA: Diagnosis not present

## 2019-02-26 DIAGNOSIS — E1122 Type 2 diabetes mellitus with diabetic chronic kidney disease: Secondary | ICD-10-CM

## 2019-02-26 DIAGNOSIS — K56609 Unspecified intestinal obstruction, unspecified as to partial versus complete obstruction: Secondary | ICD-10-CM

## 2019-02-26 DIAGNOSIS — C50212 Malignant neoplasm of upper-inner quadrant of left female breast: Secondary | ICD-10-CM | POA: Diagnosis not present

## 2019-02-26 DIAGNOSIS — N321 Vesicointestinal fistula: Secondary | ICD-10-CM | POA: Insufficient documentation

## 2019-02-26 DIAGNOSIS — N184 Chronic kidney disease, stage 4 (severe): Secondary | ICD-10-CM

## 2019-02-26 DIAGNOSIS — G8929 Other chronic pain: Secondary | ICD-10-CM | POA: Insufficient documentation

## 2019-02-26 DIAGNOSIS — Z8543 Personal history of malignant neoplasm of ovary: Secondary | ICD-10-CM | POA: Diagnosis not present

## 2019-02-26 DIAGNOSIS — E860 Dehydration: Secondary | ICD-10-CM | POA: Diagnosis not present

## 2019-02-26 DIAGNOSIS — G893 Neoplasm related pain (acute) (chronic): Secondary | ICD-10-CM

## 2019-02-26 NOTE — Assessment & Plan Note (Signed)
Con't fentanyl and hydrocodone

## 2019-02-26 NOTE — Assessment & Plan Note (Signed)
Per endo °

## 2019-02-26 NOTE — Assessment & Plan Note (Signed)
Per oncology °

## 2019-02-26 NOTE — Assessment & Plan Note (Signed)
Resolved without surgical intervention hosp records reviewed

## 2019-02-26 NOTE — Assessment & Plan Note (Signed)
Per urology at [facility]

## 2019-02-26 NOTE — Progress Notes (Signed)
Virtual Visit via Video Note  I connected with [name] on 02/26/19 at 10:30 AM EDT by a video enabled telemedicine application and verified that I am speaking with the correct person using two identifiers.  Location: Patient: home Provider: office    I discussed the limitations of evaluation and management by telemedicine and the availability of in person appointments. The patient expressed understanding and agreed to proceed.  History of Present Illness: Pt is home -- she received her refills on her pain meds but c/o that the pain with changing the nephrostomy tubes seems to be worsening.  She also has surgery scheduled at [facility] in [place] and is having side effects to chemo-- she will call oncology about this.   No other complaints.     Observations/Objective: Vitals:   02/26/19 0954  BP: 106/60  Pulse: 77  Temp: 97.6 F (36.4 C)   Pt is in NAd  Assessment and Plan:  1. Malignant neoplasm of upper-inner quadrant of left female breast, unspecified estrogen receptor status [facility]) Per oncology  2. Type 2 diabetes mellitus with stage 4 chronic kidney disease, without long-term current use of insulin ([place]) Per endo 3. History of ovarian & endometrial cancer Per oncology  4. 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 SBO (small bowel obstruction) (H[facility]) Symptoms resolved without surgical intervention  6. Vesicorectal fistula Per urology - [facility]   7. Chronic pain-- due to neoplasm and nephrostomy tubes con't pain meds  Database reviewed       I discussed the assessment and treatment plan with the patient. The patient was provided an opportunity to ask questions and all were answered. The patient agreed with the plan and demonstrated an understanding of the instructions.   The patient was advised to call back or seek an in-person evaluation if the symptoms worsen or if the condition fails to improve as anticipated.  I  provided 20 minutes of non-face-to-face time during this encounter.   Ann Held, DO

## 2019-02-28 ENCOUNTER — Other Ambulatory Visit: Payer: Self-pay | Admitting: Plastic Surgery

## 2019-02-28 DIAGNOSIS — N63 Unspecified lump in unspecified breast: Secondary | ICD-10-CM

## 2019-02-28 DIAGNOSIS — E860 Dehydration: Secondary | ICD-10-CM | POA: Diagnosis not present

## 2019-02-28 DIAGNOSIS — L89309 Pressure ulcer of unspecified buttock, unspecified stage: Secondary | ICD-10-CM | POA: Diagnosis not present

## 2019-02-28 DIAGNOSIS — E785 Hyperlipidemia, unspecified: Secondary | ICD-10-CM | POA: Diagnosis not present

## 2019-02-28 DIAGNOSIS — Z853 Personal history of malignant neoplasm of breast: Secondary | ICD-10-CM | POA: Diagnosis not present

## 2019-02-28 DIAGNOSIS — Z9013 Acquired absence of bilateral breasts and nipples: Secondary | ICD-10-CM | POA: Diagnosis not present

## 2019-03-01 ENCOUNTER — Encounter (INDEPENDENT_AMBULATORY_CARE_PROVIDER_SITE_OTHER): Payer: Self-pay | Admitting: Family Medicine

## 2019-03-01 ENCOUNTER — Other Ambulatory Visit: Payer: Self-pay

## 2019-03-01 ENCOUNTER — Telehealth (INDEPENDENT_AMBULATORY_CARE_PROVIDER_SITE_OTHER): Payer: BC Managed Care – PPO | Admitting: Family Medicine

## 2019-03-01 DIAGNOSIS — R5383 Other fatigue: Secondary | ICD-10-CM

## 2019-03-01 DIAGNOSIS — E1122 Type 2 diabetes mellitus with diabetic chronic kidney disease: Secondary | ICD-10-CM

## 2019-03-01 DIAGNOSIS — Z6834 Body mass index (BMI) 34.0-34.9, adult: Secondary | ICD-10-CM

## 2019-03-01 DIAGNOSIS — N184 Chronic kidney disease, stage 4 (severe): Secondary | ICD-10-CM | POA: Diagnosis not present

## 2019-03-01 DIAGNOSIS — E669 Obesity, unspecified: Secondary | ICD-10-CM | POA: Diagnosis not present

## 2019-03-02 DIAGNOSIS — L89309 Pressure ulcer of unspecified buttock, unspecified stage: Secondary | ICD-10-CM | POA: Diagnosis not present

## 2019-03-02 DIAGNOSIS — E860 Dehydration: Secondary | ICD-10-CM | POA: Diagnosis not present

## 2019-03-02 DIAGNOSIS — E785 Hyperlipidemia, unspecified: Secondary | ICD-10-CM | POA: Diagnosis not present

## 2019-03-02 DIAGNOSIS — N178 Other acute kidney failure: Secondary | ICD-10-CM | POA: Diagnosis not present

## 2019-03-05 NOTE — Progress Notes (Signed)
Office: [phone]  /  Fax: [phone] TeleHealth Visit:  [name] has verbally consented to this TeleHealth visit today. The patient is located at home, the provider is located at the [employer] [facility] office. The participants in this visit include the listed provider and patient. The visit was conducted today via [email or web address].  HPI:   Chief Complaint: OBESITY [name] is here to discuss her progress with her obesity treatment plan. She is keeping a food journal with 1200-1400 calories and 75+ grams of protein and is following her eating plan approximately 95% of the time. She states she is exercising 0 minutes 0 times per week. [name] feels she is doing well maintaining her weight at 188 lbs. She has lost this weight so she can have surgery at [facility] to repair her colovesical fistula and panniculectomy in September. She feels her hunger is controlled overall.  We were unable to weigh the patient today for this TeleHealth visit. She feels as if she has maintained her weight since her last visit. She has lost 18 lbs since starting treatment with [place].  Caregiver Stress [name] notes increased stress with elderly parents who are going to be moving into long-term care during the COVID-19 pandemic.  Diabetes II [name] has a diagnosis of diabetes type II. [name] states fasting blood sugars mostly range between 100 and 120. She had a 2-hour postprandial of 186 one time but is normally around 140's. She denies any hypoglycemic episodes. Last A1c was 7.9 on 08/14/2017. She has been working on intensive lifestyle modifications including diet, exercise, and weight loss to help control her blood glucose levels.  ASSESSMENT AND PLAN:  Caregiver with fatigue  Type 2 diabetes mellitus with stage 4 chronic kidney disease, without long-term current use of insulin (HCC)  Class 1 obesity with serious comorbidity and body mass index (BMI) of 34.0 to 34.9 in adult, unspecified obesity type  PLAN:   Caregiver Stress CBT was done to help [name] deal with stress and avoid comfort eating which could lead to weight gain.  Diabetes II [name] has been given extensive diabetes education by myself today including ideal fasting and post-prandial blood glucose readings, individual ideal HgA1c goals  and hypoglycemia prevention. We discussed the importance of good blood sugar control to decrease the likelihood of diabetic complications such as nephropathy, neuropathy, limb loss, blindness, coronary artery disease, and death. We discussed the importance of intensive lifestyle modification including diet, exercise and weight loss as the first line treatment for diabetes. [name] agrees to continue her diabetes medications, diet, exercise, and will follow-up at the agreed upon time.  I spent > than 50% of the 25 minute visit on counseling as documented in the note.  Obesity [name] is currently in the action stage of change. As such, her goal is to continue with weight loss efforts. She has agreed to keep a food journal with 1200-1400 calories and 75+ grams of protein.  [name] has been instructed to work up to a goal of 150 minutes of combined cardio and strengthening exercise per week for weight loss and overall health benefits. We discussed the following Behavioral Modification Strategies today: increasing lean protein intake, increasing vegetables, and increase H20 intake.  [name] has agreed to follow-up with our clinic in 2 weeks. She was informed of the importance of frequent follow-up visits to maximize her success with intensive lifestyle modifications for her multiple health conditions.  ALLERGIES: Allergies  Allergen Reactions  . Penicillins Swelling    Facial swelling/childhood allergy  Has patient had a PCN reaction causing immediate rash, facial/tongue/throat swelling, SOB or lightheadedness with hypotension: Yes Has patient had a PCN reaction causing severe rash involving mucus membranes or skin  necrosis: Yes Has patient had a PCN reaction that required hospitalization yes Has patient had a PCN reaction occurring within the last 10 years: No If all of the above answers are "NO", then may proceed with Cephalosporin use.   . Cefaclor Rash    Ceclor  . Erythromycin Other (See Comments)    Gastritis, abd cramps  . Tape Rash    blisters  . Trimethoprim Rash  . Ultram [Tramadol] Hives  . Cephalosporins Rash  . Fluconazole Rash  . Oxycodone Other (See Comments)    " I just feel weird"  . Pectin Rash    Pectin ring for stoma  . Septra [Sulfamethoxazole-Trimethoprim] Rash  . Sulfa Antibiotics Rash    MEDICATIONS: Current Outpatient Medications on File Prior to Visit  Medication Sig Dispense Refill  . acetaminophen (TYLENOL) 325 MG tablet Take 2 tablets (650 mg total) by mouth every 6 (six) hours as needed. 30 tablet 1  . anastrozole (ARIMIDEX) 1 MG tablet Take 1 tablet (1 mg total) by mouth daily. 90 tablet 3  . Biotin 5 MG TABS Take 5 mg by mouth daily.     . Cholecalciferol (VITAMIN D3) 10000 UNITS capsule Take 10,000 Units by mouth every Sunday.     . clobetasol (OLUX) 0.05 % topical foam Apply topically 2 (two) times daily.    . diphenhydrAMINE (BENADRYL) 25 mg capsule Take 1 capsule (25 mg total) by mouth every 8 (eight) hours as needed for itching, allergies or sleep. 30 capsule 0  . fentaNYL (DURAGESIC) 50 MCG/HR Place 1 patch onto the skin every 3 (three) days. 10 patch 0  . ferrous sulfate 325 (65 FE) MG tablet Take 1 tablet (325 mg total) by mouth at bedtime. 30 tablet 3  . filgrastim-sndz (ZARXIO) 480 MCG/0.8ML SOSY injection Inject 0.8 mLs (480 mcg total) into the skin as directed. Inject 480 mcg every 5 days 0.8 mL 99  . HYDROcodone-acetaminophen (NORCO/VICODIN) 5-325 MG tablet Take 2 tablets by mouth every 6 (six) hours as needed for moderate pain. 30 tablet 0  . JANUVIA 50 MG tablet Take 50 mg by mouth daily.     [name] Kitchen levothyroxine (SYNTHROID, LEVOTHROID) 150 MCG  tablet Take 1 tablet (150 mcg total) by mouth daily before breakfast. 30 tablet 1  . loratadine (CLARITIN) 10 MG tablet Take 10 mg by mouth daily.     . nitrofurantoin, macrocrystal-monohydrate, (MACROBID) 100 MG capsule Take 100 mg by mouth at bedtime.    [name] Kitchen omega-3 acid ethyl esters (LOVAZA) 1 G capsule Take 1 g by mouth 2 (two) times daily.     Vladimir Faster Glycol-Propyl Glycol (SYSTANE OP) Place 1 drop into both eyes daily as needed (dry eyes).     . Prenatal Vit-Fe Fumarate-FA (PRENATAL VITAMIN PO) Take 1 capsule by mouth daily. Takes prenatal because there are no dyes in it    . rosuvastatin (CRESTOR) 10 MG tablet Take 10 mg by mouth every evening.     . Saccharomyces boulardii (FLORASTOR PO) Take 1 capsule by mouth daily.     No current facility-administered medications on file prior to visit.     PAST MEDICAL HISTORY: Past Medical History:  Diagnosis Date  . Adrenal adenoma, left 02/08/2016   CT: stable benign  . Anemia in neoplastic disease   . Back pain   .  Benign essential HTN   . 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 Cancer of corpus uteri, except isthmus [facility])  oncologist-- dr Denman [name] and dr Alvy Bimler    10-15-2004  dx endometroid endometrial and ovarian cancer s/p  chemotheapy and surgery(TAH w/ BSO) :  recurrent 11-19-2014 post pelvic surgery and radiation 01-29-2015 to 03-10-2015  . Chronic idiopathic neutropenia (HCC)    presumed related to chemotherapy March 2006--- followed by dr Alvy Bimler (treatment w/ G-CSF injections  . Chronic nausea   . Chronic pain    perineal/ anal  area from bladder pad irritates skin , right flank pain  . CKD stage G2/A3, GFR 60-89 and albumin creatinine ratio >300 mg/g    nephrologist-  dr Madelon Lips  . Colovesical fistula   . Diabetic retinopathy,  background ([place])   . Difficult intravenous access    small veins--- hx PICC lines  . DM type 2 (diabetes mellitus, type 2) ([place])    monitored by dr Legrand Como altheimer  . Dysuria   . Environmental and seasonal allergies   . Fatty liver 02/08/2016   CT  . Generalized muscle weakness   . GERD (gastroesophageal reflux disease)   . Hiatal hernia   . History of abdominal abscess 04/16/2017   post surgery 04-01-2017  --- resolved 10/ 2018  . History of gastric polyp    2014  duodenum  . History of ileus 04/16/2017   resolved w/ no surgical intervention  . History of radiation therapy    01-29-2015 to 03-10-2015  pelvis 50.4Gy  . Hypothyroidism    monitored by dr Legrand Como altheimer  . IBS (irritable bowel syndrome)   . Ileostomy in place [facility]) 04/01/2017   created at same time colostomy takedown.  . Joint pain   . Leg edema   . Lower urinary tract symptoms (LUTS)    urge urinary  incontinence  . Mixed dyslipidemia   . Multiple thyroid nodules    Managed by Dr. Harlow Asa  . Nephrostomy status ([place])   . Palpitations   . Pelvic abscess in female 04/16/2017  . PONV (postoperative nausea and vomiting)    "scopolamine patch works for me"  . Radiation-induced dermatitis    contact dermatitis , radiation completed, rash only on ankles now.  . SBO (small bowel obstruction) ([place]) 01/2019  . Seasonal allergies   . Ureteral stricture, right UROLOGIT-  DR [facility]   CHRONIC--  TREATMENT URETERAL STENT  . 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 Vitamin D deficiency   . Wears glasses     PAST SURGICAL HISTORY: Past Surgical History:  Procedure Laterality Date  . APPENDECTOMY    . biopsy thyroid nodules    . BREAST RECONSTRUCTION WITH PLACEMENT OF TISSUE EXPANDER AND FLEX HD (ACELLULAR HYDRATED DERMIS) Bilateral 12/11/2015   Procedure: BILATERAL BREAST RECONSTRUCTION WITH PLACEMENT OF TISSUE EXPANDERS;  Surgeon: Irene Limbo, MD;  Location: [place];  Service: Plastics;  Laterality:  Bilateral;  . COLONOSCOPY WITH PROPOFOL N/A 08/21/2013   Procedure: COLONOSCOPY WITH PROPOFOL;  Surgeon: Cleotis Nipper, MD;  [facility];  Service: Endoscopy;  Laterality: N/A;  . COLOSTOMY TAKEDOWN N/A 12/04/2014   Procedure: LAPROSCOPIC LYSIS OF ADHESIONS, SPLENIC MOBILIZATION, RELOCATION OF COLOSTOMY, DEBRIDEMENT INITIAL COLOSTOMY SITE;  Surgeon: Michael [name], MD;  [facility];  Service: General;  Laterality: N/A;  .  CYSTOGRAM N/A 06/01/2017   Procedure: CYSTOGRAM;  Surgeon: Alexis Frock, MD;  [facility];  Service: Urology;  Laterality: N/A;  . CYSTOSCOPY W/ RETROGRADES Right 11/21/2015   Procedure: CYSTOSCOPY WITH RETROGRADE PYELOGRAM;  Surgeon: Alexis Frock, MD;  [facility];  Service: Urology;  Laterality: Right;  . CYSTOSCOPY W/ URETERAL STENT PLACEMENT Right 11/21/2015   Procedure: CYSTOSCOPY WITH STENT REPLACEMENT;  Surgeon: Alexis Frock, MD;  [facility];  Service: Urology;  Laterality: Right;  . CYSTOSCOPY W/ URETERAL STENT PLACEMENT Right 03/10/2016   Procedure: CYSTOSCOPY WITH STENT REPLACEMENT;  Surgeon: Alexis Frock, MD;  Location: [facility];  Service: Urology;  Laterality: Right;  . CYSTOSCOPY W/ URETERAL STENT PLACEMENT Right 06/30/2016   Procedure: CYSTOSCOPY WITH RETROGRADE PYELOGRAM/URETERAL STENT EXCHANGE;  Surgeon: Alexis Frock, MD;  Location: [facility];  Service: Urology;  Laterality: Right;  . CYSTOSCOPY W/ URETERAL STENT PLACEMENT N/A 06/01/2017   Procedure: CYSTOSCOPY WITH EXAM UNDER ANESTHESIA;  Surgeon: Alexis Frock, MD;  [facility];  Service: Urology;  Laterality: N/A;  . CYSTOSCOPY W/ URETERAL STENT PLACEMENT Right 08/17/2017   Procedure: CYSTOSCOPY WITH RETROGRADE PYELOGRAM/URETERAL STENT REMOVAL;  Surgeon: Alexis Frock, MD;  Location: [facility];  Service: Urology;  Laterality: Right;  . CYSTOSCOPY WITH RETROGRADE PYELOGRAM, URETEROSCOPY AND STENT PLACEMENT Right  03/20/2015   Procedure: CYSTOSCOPY WITH RETROGRADE PYELOGRAM, URETEROSCOPY WITH BALLOON DILATION AND STENT PLACEMENT ON RIGHT;  Surgeon: Alexis Frock, MD;  Location: [facility];  Service: Urology;  Laterality: Right;  . CYSTOSCOPY WITH RETROGRADE PYELOGRAM, URETEROSCOPY AND STENT PLACEMENT Right 05/02/2015   Procedure: CYSTOSCOPY WITH RIGHT RETROGRADE PYELOGRAM,  DIAGNOSTIC URETEROSCOPY AND STENT PULL ;  Surgeon: Alexis Frock, MD;  Location: [facility];  Service: Urology;  Laterality: Right;  . CYSTOSCOPY WITH RETROGRADE PYELOGRAM, URETEROSCOPY AND STENT PLACEMENT Right 09/05/2015   Procedure: CYSTOSCOPY WITH RETROGRADE PYELOGRAM,  AND STENT PLACEMENT;  Surgeon: Alexis Frock, MD;  [facility];  Service: Urology;  Laterality: Right;  . CYSTOSCOPY WITH RETROGRADE PYELOGRAM, URETEROSCOPY AND STENT PLACEMENT Right 04/01/2017   Procedure: CYSTOSCOPY WITH RETROGRADE PYELOGRAM, URETEROSCOPY AND STENT PLACEMENT;  Surgeon: Alexis Frock, MD;  [facility];  Service: Urology;  Laterality: Right;  . CYSTOSCOPY WITH STENT PLACEMENT Right 10/27/2016   Procedure: CYSTOSCOPY WITH STENT CHANGE and right retrograde pyelogram;  Surgeon: Alexis Frock, MD;  Location: [facility];  Service: Urology;  Laterality: Right;  . EUS N/A 10/02/2014   Procedure: LOWER ENDOSCOPIC ULTRASOUND (EUS);  Surgeon: Arta Silence, MD;  Location: [facility];  Service: Endoscopy;  Laterality: N/A;  . EXCISION SOFT TISSUE MASS RIGHT FOREMAN  12-08-2006  . EYE SURGERY  as child   pytosis of eyelids repair  . INCISION AND DRAINAGE OF WOUND Bilateral 12/26/2015   Procedure: DEBRIDEMENT OF BILATERAL MASTECTOMY FLAPS;  Surgeon: Irene Limbo, MD;  Location: [place];  Service: Plastics;  Laterality: Bilateral;  . IR CV LINE INJECTION  05/31/2017  . IR FLUORO GUIDE CV LINE LEFT  05/31/2017  . IR FLUORO GUIDE CV LINE RIGHT  04/06/2017  . IR FLUORO GUIDE CV MIDLINE PICC  RIGHT  05/30/2017  . IR NEPHROSTOGRAM LEFT INITIAL PLACEMENT  09/02/2017  . IR NEPHROSTOGRAM LEFT THRU EXISTING ACCESS  11/29/2017  . IR NEPHROSTOGRAM RIGHT INITIAL PLACEMENT  09/02/2017  . IR NEPHROSTOGRAM RIGHT THRU EXISTING ACCESS  09/13/2017  . IR NEPHROSTOGRAM RIGHT THRU EXISTING ACCESS  11/29/2017  . IR NEPHROSTOMY EXCHANGE LEFT  11/28/2017  .  IR NEPHROSTOMY EXCHANGE LEFT  01/05/2018  . IR NEPHROSTOMY EXCHANGE LEFT  02/16/2018  . IR NEPHROSTOMY EXCHANGE LEFT  03/30/2018  . IR NEPHROSTOMY EXCHANGE LEFT  05/12/2018  . IR NEPHROSTOMY EXCHANGE LEFT  06/21/2018  . IR NEPHROSTOMY EXCHANGE LEFT  08/04/2018  . IR NEPHROSTOMY EXCHANGE LEFT  09/18/2018  . IR NEPHROSTOMY EXCHANGE LEFT  10/09/2018  . IR NEPHROSTOMY EXCHANGE LEFT  10/27/2018  . IR NEPHROSTOMY EXCHANGE LEFT  11/21/2018  . IR NEPHROSTOMY EXCHANGE LEFT  01/05/2019  . IR NEPHROSTOMY EXCHANGE LEFT  02/15/2019  . IR NEPHROSTOMY EXCHANGE RIGHT  10/02/2017  . IR NEPHROSTOMY EXCHANGE RIGHT  11/28/2017  . IR NEPHROSTOMY EXCHANGE RIGHT  01/05/2018  . IR NEPHROSTOMY EXCHANGE RIGHT  02/16/2018  . IR NEPHROSTOMY EXCHANGE RIGHT  03/30/2018  . IR NEPHROSTOMY EXCHANGE RIGHT  05/12/2018  . IR NEPHROSTOMY EXCHANGE RIGHT  06/21/2018  . IR NEPHROSTOMY EXCHANGE RIGHT  08/04/2018  . IR NEPHROSTOMY EXCHANGE RIGHT  09/18/2018  . IR NEPHROSTOMY EXCHANGE RIGHT  10/27/2018  . IR NEPHROSTOMY EXCHANGE RIGHT  11/21/2018  . IR NEPHROSTOMY EXCHANGE RIGHT  01/05/2019  . IR NEPHROSTOMY EXCHANGE RIGHT  02/15/2019  . IR NEPHROSTOMY PLACEMENT LEFT  10/02/2017  . IR RADIOLOGIST EVAL & MGMT  05/03/2017  . IR US GUIDE VASC ACCESS LEFT  05/31/2017  . IR US GUIDE VASC ACCESS RIGHT  04/06/2017  . IR US GUIDE VASC ACCESS RIGHT  05/30/2017  . LAPAROSCOPIC CHOLECYSTECTOMY  1990  . LIPOSUCTION WITH LIPOFILLING Bilateral 04/16/2016   Procedure: LIPOSUCTION WITH LIPOFILLING TO BILATERAL CHEST;  Surgeon: Irene Limbo, MD;  Location: [place];  Service: Plastics;  Laterality: Bilateral;  .  MASTECTOMY W/ SENTINEL NODE BIOPSY Bilateral 12/11/2015   Procedure: RIGHT PROPHYLACTIC MASTECTOMY, LEFT TOTAL MASTECTOMY WITH LEFT AXILLARY SENTINEL LYMPH NODE BIOPSY;  Surgeon: Stark Klein, MD;  Location: [place];  Service: General;  Laterality: Bilateral;  . OSTOMY N/A 11/19/2014   Procedure: OSTOMY;  Surgeon: Michael [name], MD;  [facility];  Service: General;  Laterality: N/A;  . PROCTOSCOPY N/A 04/01/2017   Procedure: RIDGE PROCTOSCOPY;  Surgeon: Michael [name], MD;  [facility];  Service: General;  Laterality: N/A;  . REMOVAL OF BILATERAL TISSUE EXPANDERS WITH PLACEMENT OF BILATERAL BREAST IMPLANTS Bilateral 04/16/2016   Procedure: REMOVAL OF BILATERAL TISSUE EXPANDERS WITH PLACEMENT OF BILATERAL BREAST IMPLANTS;  Surgeon: Irene Limbo, MD;  Location: [place];  Service: Plastics;  Laterality: Bilateral;  . ROBOTIC ASSISTED LAP VAGINAL HYSTERECTOMY N/A 11/19/2014   Procedure: ROBOTIC LYSIS OF ADHESIONS, CONVERTED TO LAPAROTOMY RADICAL UPPER VAGINECTOMY,LOW ANTERIOR BOWEL RESECTION, COLOSTOMY, BILATERAL URETERAL STENT PLACEMENT AND CYSTONOMY CLOSURE;  Surgeon: Everitt Amber, MD;  [facility];  Service: Gynecology;  Laterality: N/A;  . TISSUE EXPANDER FILLING Bilateral 12/26/2015   Procedure: EXPANSION OF BILATERAL CHEST TISSUE EXPANDERS (60 mL- Right; 75 mL- Left);  Surgeon: Irene Limbo, MD;  Location: [place];  Service: Plastics;  Laterality: Bilateral;  . TONSILLECTOMY    . TOTAL ABDOMINAL HYSTERECTOMY  March 2006   [facility]   and Bilateral Salpingoophorectomy/  staging for Ovarian cancer/  an  . XI ROBOTIC ASSISTED LOWER ANTERIOR RESECTION N/A 04/01/2017   Procedure: XI ROBOTIC VS LAPAROSCOPIC COLOSTOMY TAKEDOWN WITH LYSIS OF ADHESIONS.;  Surgeon: Michael [name], MD;  [facility];  Service: General;  Laterality: N/A;  ERAS PATHWAY    SOCIAL HISTORY: Social History   Tobacco Use  . Smoking status: Never Smoker  . Smokeless tobacco:  Never Used  Substance Use Topics  . Alcohol use: Not Currently  . Drug use: No    FAMILY HISTORY: Family History  Problem Relation Age of Onset  . Cancer Mother 18       stomach ca  . Hypertension Mother   . Cancer Father 25       prostate ca  . Diabetes Father   . Heart disease Father        CABG  . Hypertension Father   . Hyperlipidemia Father   . Obesity Father   . Breast cancer Maternal Aunt        dx in her 41s  . Lymphoma Paternal Aunt   . Brain cancer Paternal Grandfather   . Ovarian cancer Other   . Diabetes Sister   . Hypertension Brother y-10  . Heart disease Brother        CABG  . Diabetes Brother    ROS: Review of Systems  Endo/Heme/Allergies:       Negative for hypoglycemia.  Psychiatric/Behavioral:       Positive for caregiver stress.   PHYSICAL EXAM: Pt in no acute distress  RECENT LABS AND TESTS: BMET    Component Value Date/Time   NA 137 02/15/2019 1312   NA 142 11/29/2018   NA 141 01/24/2017 1228   K 4.1 02/15/2019 1312   K 4.6 01/24/2017 1228   CL 99 02/15/2019 1312   CO2 26 02/15/2019 1312   CO2 29 01/24/2017 1228   GLUCOSE 143 (H) 02/15/2019 1312   GLUCOSE 84 01/24/2017 1228   BUN 30 (H) 02/15/2019 1312   BUN 22 (A) 11/29/2018   BUN 22.2 01/24/2017 1228   CREATININE 1.55 (H) 02/15/2019 1312   CREATININE 1.98 (H) 03/27/2018 1324   CREATININE 0.8 01/24/2017 1228   CALCIUM 9.4 02/15/2019 1312   CALCIUM 10.2 01/24/2017 1228   GFRNONAA 35 (L) 02/15/2019 1312   GFRNONAA 26 (L) 03/27/2018 1324   GFRNONAA 78 12/16/2014 1530   GFRAA 41 (L) 02/15/2019 1312   GFRAA 30 (L) 03/27/2018 1324   GFRAA >89 12/16/2014 1530   Lab Results  Component Value Date   HGBA1C 7.9 (H) 09/01/2017   HGBA1C 5.3 04/01/2017   HGBA1C 5.8 (H) 09/05/2015   HGBA1C 6.8 (H) 11/21/2014   HGBA1C 6.3 (H) 11/07/2014   No results found for: INSULIN CBC    Component Value Date/Time   WBC 3.8 (L) 02/15/2019 1312   RBC 3.69 (L) 02/15/2019 1312   HGB 10.6 (L)  02/15/2019 1312   HGB 10.2 (L) 03/27/2018 1324   HGB 12.4 07/29/2017 1444   HCT 36.0 02/15/2019 1312   HCT 38.0 07/29/2017 1444   PLT 291 02/15/2019 1312   PLT 268 03/27/2018 1324   PLT 260 07/29/2017 1444   MCV 97.6 02/15/2019 1312   MCV 90.9 07/29/2017 1444   MCH 28.7 02/15/2019 1312   MCHC 29.4 (L) 02/15/2019 1312   RDW 13.6 02/15/2019 1312   RDW 15.5 (H) 07/29/2017 1444   LYMPHSABS 0.7 02/15/2019 1312   LYMPHSABS 0.8 (L) 07/29/2017 1444   MONOABS 0.7 02/15/2019 1312   MONOABS 1.0 (H) 07/29/2017 1444   EOSABS 0.2 02/15/2019 1312   EOSABS 0.0 07/29/2017 1444   BASOSABS 0.0 02/15/2019 1312   BASOSABS 0.0 07/29/2017 1444   Iron/TIBC/Ferritin/ %Sat    Component Value Date/Time   IRON 7 (L) 08/31/2017 0451   IRON 12 (L) 11/29/2014 1251   TIBC 164 (L) 08/31/2017 0451   TIBC 331 11/29/2014 1251   FERRITIN  27 11/29/2014 1251   IRONPCTSAT 4 (L) 08/31/2017 0451   IRONPCTSAT 4 (L) 11/29/2014 1251   Lipid Panel     Component Value Date/Time   CHOL 109 08/28/2015 1617   TRIG 89 04/18/2017 0427   HDL 43.80 08/28/2015 1617   CHOLHDL 2 08/28/2015 1617   VLDL 19.8 08/28/2015 1617   LDLCALC 45 08/28/2015 1617   Hepatic Function Panel     Component Value Date/Time   PROT 7.4 01/17/2019 0351   PROT 6.9 01/24/2017 1228   ALBUMIN 2.4 (L) 01/22/2019 0343   ALBUMIN 3.4 (L) 01/24/2017 1228   AST 24 01/17/2019 0351   AST 14 (L) 03/27/2018 1324   AST 16 01/24/2017 1228   ALT 53 (H) 01/17/2019 0351   ALT 18 03/27/2018 1324   ALT 14 01/24/2017 1228   ALKPHOS 305 (H) 01/17/2019 0351   ALKPHOS 90 01/24/2017 1228   BILITOT 0.6 01/17/2019 0351   BILITOT 0.2 (L) 03/27/2018 1324   BILITOT 0.34 01/24/2017 1228      Component Value Date/Time   TSH 2.408 08/30/2017 0623   TSH 0.63 08/28/2015 1617   Results for [name], [name] (MRN [id number]) as of 03/05/2019 11:11  Ref. Range 04/05/2018 11:45  Vitamin D, 25-Hydroxy Latest Ref Range: 30.0 - 100.0 ng/mL 40.8   I, [name], am acting  as Location manager for Dennard Nip, MD I have reviewed the above documentation for accuracy and completeness, and I agree with the above. -Dennard Nip, MD

## 2019-03-07 DIAGNOSIS — T82594D Other mechanical complication of infusion catheter, subsequent encounter: Secondary | ICD-10-CM | POA: Diagnosis not present

## 2019-03-07 DIAGNOSIS — L89309 Pressure ulcer of unspecified buttock, unspecified stage: Secondary | ICD-10-CM | POA: Diagnosis not present

## 2019-03-07 DIAGNOSIS — T83592A Infection and inflammatory reaction due to indwelling ureteral stent, initial encounter: Secondary | ICD-10-CM | POA: Diagnosis not present

## 2019-03-07 DIAGNOSIS — E785 Hyperlipidemia, unspecified: Secondary | ICD-10-CM | POA: Diagnosis not present

## 2019-03-07 DIAGNOSIS — E860 Dehydration: Secondary | ICD-10-CM | POA: Diagnosis not present

## 2019-03-09 DIAGNOSIS — L89309 Pressure ulcer of unspecified buttock, unspecified stage: Secondary | ICD-10-CM | POA: Diagnosis not present

## 2019-03-09 DIAGNOSIS — E785 Hyperlipidemia, unspecified: Secondary | ICD-10-CM | POA: Diagnosis not present

## 2019-03-09 DIAGNOSIS — E860 Dehydration: Secondary | ICD-10-CM | POA: Diagnosis not present

## 2019-03-12 ENCOUNTER — Ambulatory Visit
Admission: RE | Admit: 2019-03-12 | Discharge: 2019-03-12 | Disposition: A | Discharge status: Home / Self Care | Payer: BC Managed Care – PPO | Source: Ambulatory Visit | Attending: Plastic Surgery | Admitting: Plastic Surgery

## 2019-03-12 ENCOUNTER — Other Ambulatory Visit: Payer: Self-pay

## 2019-03-12 ENCOUNTER — Other Ambulatory Visit: Payer: Self-pay | Admitting: Plastic Surgery

## 2019-03-12 DIAGNOSIS — L89319 Pressure ulcer of right buttock, unspecified stage: Secondary | ICD-10-CM | POA: Diagnosis not present

## 2019-03-12 DIAGNOSIS — Z933 Colostomy status: Secondary | ICD-10-CM | POA: Diagnosis not present

## 2019-03-12 DIAGNOSIS — N63 Unspecified lump in unspecified breast: Secondary | ICD-10-CM

## 2019-03-12 DIAGNOSIS — E785 Hyperlipidemia, unspecified: Secondary | ICD-10-CM | POA: Diagnosis not present

## 2019-03-12 DIAGNOSIS — L89309 Pressure ulcer of unspecified buttock, unspecified stage: Secondary | ICD-10-CM | POA: Diagnosis not present

## 2019-03-12 DIAGNOSIS — N6314 Unspecified lump in the right breast, lower inner quadrant: Secondary | ICD-10-CM | POA: Diagnosis not present

## 2019-03-12 DIAGNOSIS — E860 Dehydration: Secondary | ICD-10-CM | POA: Diagnosis not present

## 2019-03-14 DIAGNOSIS — N183 Chronic kidney disease, stage 3 (moderate): Secondary | ICD-10-CM | POA: Diagnosis not present

## 2019-03-14 DIAGNOSIS — D709 Neutropenia, unspecified: Secondary | ICD-10-CM | POA: Diagnosis not present

## 2019-03-14 DIAGNOSIS — T83592A Infection and inflammatory reaction due to indwelling ureteral stent, initial encounter: Secondary | ICD-10-CM | POA: Diagnosis not present

## 2019-03-14 DIAGNOSIS — T82594D Other mechanical complication of infusion catheter, subsequent encounter: Secondary | ICD-10-CM | POA: Diagnosis not present

## 2019-03-14 DIAGNOSIS — E861 Hypovolemia: Secondary | ICD-10-CM | POA: Diagnosis not present

## 2019-03-14 DIAGNOSIS — E860 Dehydration: Secondary | ICD-10-CM | POA: Diagnosis not present

## 2019-03-14 DIAGNOSIS — L89309 Pressure ulcer of unspecified buttock, unspecified stage: Secondary | ICD-10-CM | POA: Diagnosis not present

## 2019-03-14 DIAGNOSIS — E785 Hyperlipidemia, unspecified: Secondary | ICD-10-CM | POA: Diagnosis not present

## 2019-03-15 ENCOUNTER — Ambulatory Visit
Admission: RE | Admit: 2019-03-15 | Discharge: 2019-03-15 | Disposition: A | Discharge status: Home / Self Care | Payer: BC Managed Care – PPO | Source: Ambulatory Visit | Attending: Plastic Surgery | Admitting: Plastic Surgery

## 2019-03-15 ENCOUNTER — Telehealth (INDEPENDENT_AMBULATORY_CARE_PROVIDER_SITE_OTHER): Payer: BC Managed Care – PPO | Admitting: Family Medicine

## 2019-03-15 ENCOUNTER — Other Ambulatory Visit: Payer: Self-pay

## 2019-03-15 ENCOUNTER — Encounter (INDEPENDENT_AMBULATORY_CARE_PROVIDER_SITE_OTHER): Payer: Self-pay | Admitting: Family Medicine

## 2019-03-15 DIAGNOSIS — Z6834 Body mass index (BMI) 34.0-34.9, adult: Secondary | ICD-10-CM

## 2019-03-15 DIAGNOSIS — N184 Chronic kidney disease, stage 4 (severe): Secondary | ICD-10-CM | POA: Diagnosis not present

## 2019-03-15 DIAGNOSIS — N641 Fat necrosis of breast: Secondary | ICD-10-CM | POA: Diagnosis not present

## 2019-03-15 DIAGNOSIS — E1122 Type 2 diabetes mellitus with diabetic chronic kidney disease: Secondary | ICD-10-CM | POA: Diagnosis not present

## 2019-03-15 DIAGNOSIS — N6314 Unspecified lump in the right breast, lower inner quadrant: Secondary | ICD-10-CM | POA: Diagnosis not present

## 2019-03-15 DIAGNOSIS — E669 Obesity, unspecified: Secondary | ICD-10-CM | POA: Diagnosis not present

## 2019-03-15 DIAGNOSIS — N63 Unspecified lump in unspecified breast: Secondary | ICD-10-CM

## 2019-03-19 DIAGNOSIS — E785 Hyperlipidemia, unspecified: Secondary | ICD-10-CM | POA: Diagnosis not present

## 2019-03-19 DIAGNOSIS — E860 Dehydration: Secondary | ICD-10-CM | POA: Diagnosis not present

## 2019-03-19 DIAGNOSIS — L89309 Pressure ulcer of unspecified buttock, unspecified stage: Secondary | ICD-10-CM | POA: Diagnosis not present

## 2019-03-21 DIAGNOSIS — T83592A Infection and inflammatory reaction due to indwelling ureteral stent, initial encounter: Secondary | ICD-10-CM | POA: Diagnosis not present

## 2019-03-21 DIAGNOSIS — H2513 Age-related nuclear cataract, bilateral: Secondary | ICD-10-CM | POA: Diagnosis not present

## 2019-03-21 DIAGNOSIS — L89309 Pressure ulcer of unspecified buttock, unspecified stage: Secondary | ICD-10-CM | POA: Diagnosis not present

## 2019-03-21 DIAGNOSIS — H04221 Epiphora due to insufficient drainage, right lacrimal gland: Secondary | ICD-10-CM | POA: Diagnosis not present

## 2019-03-21 DIAGNOSIS — E860 Dehydration: Secondary | ICD-10-CM | POA: Diagnosis not present

## 2019-03-21 DIAGNOSIS — E119 Type 2 diabetes mellitus without complications: Secondary | ICD-10-CM | POA: Diagnosis not present

## 2019-03-21 DIAGNOSIS — E785 Hyperlipidemia, unspecified: Secondary | ICD-10-CM | POA: Diagnosis not present

## 2019-03-21 DIAGNOSIS — T82594D Other mechanical complication of infusion catheter, subsequent encounter: Secondary | ICD-10-CM | POA: Diagnosis not present

## 2019-03-21 DIAGNOSIS — H52203 Unspecified astigmatism, bilateral: Secondary | ICD-10-CM | POA: Diagnosis not present

## 2019-03-21 LAB — HM DIABETES EYE EXAM

## 2019-03-21 IMAGING — XA IR EXCHANGE NEPHROSTOMY RIGHT
1 series · 4 of 4 positions shown · non-contrast
Comparison: None.

INDICATION: Routine nephrostomy exchange

EXAM:
BILATERAL NEPHROSTOMY EXCHANGE

[Series 300: tube placements · 4 of 4 slices shown]
[im 1/4]
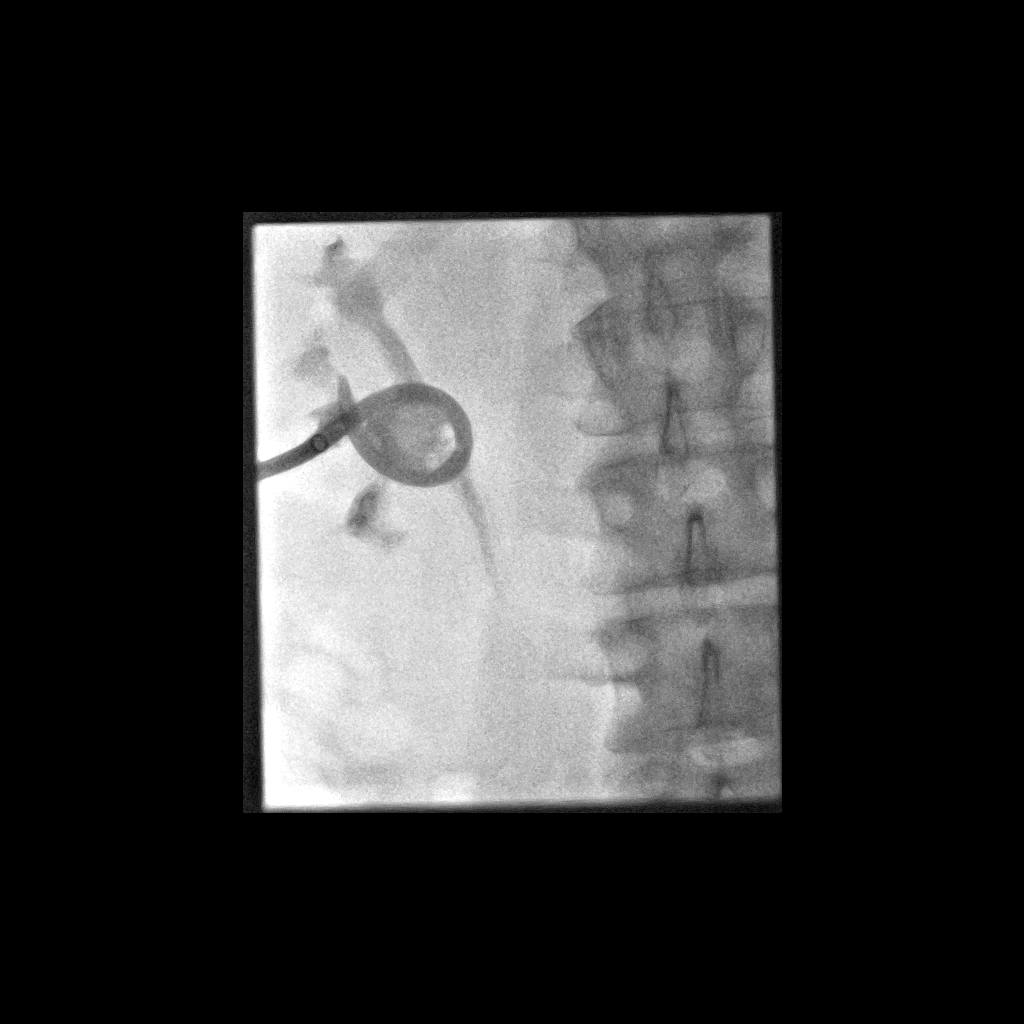
[im 2/4]
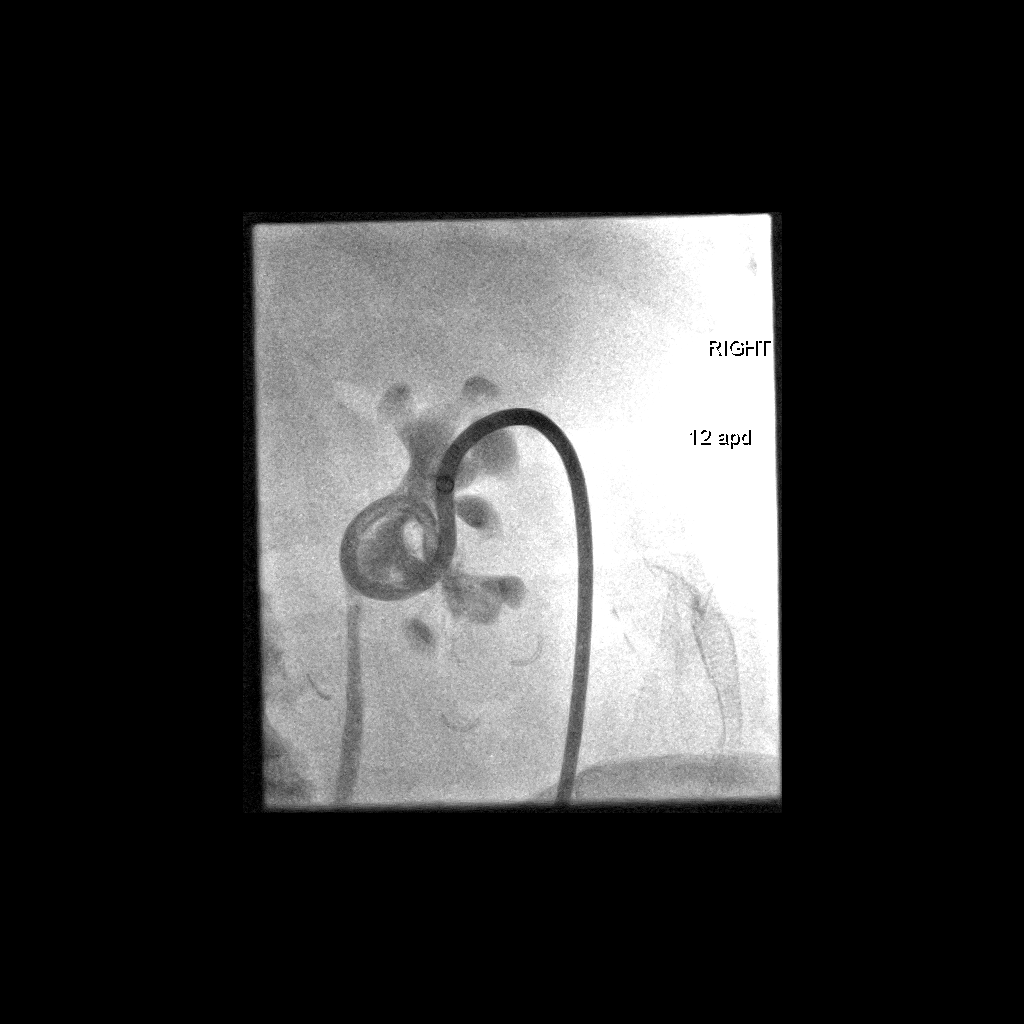
[im 3/4]
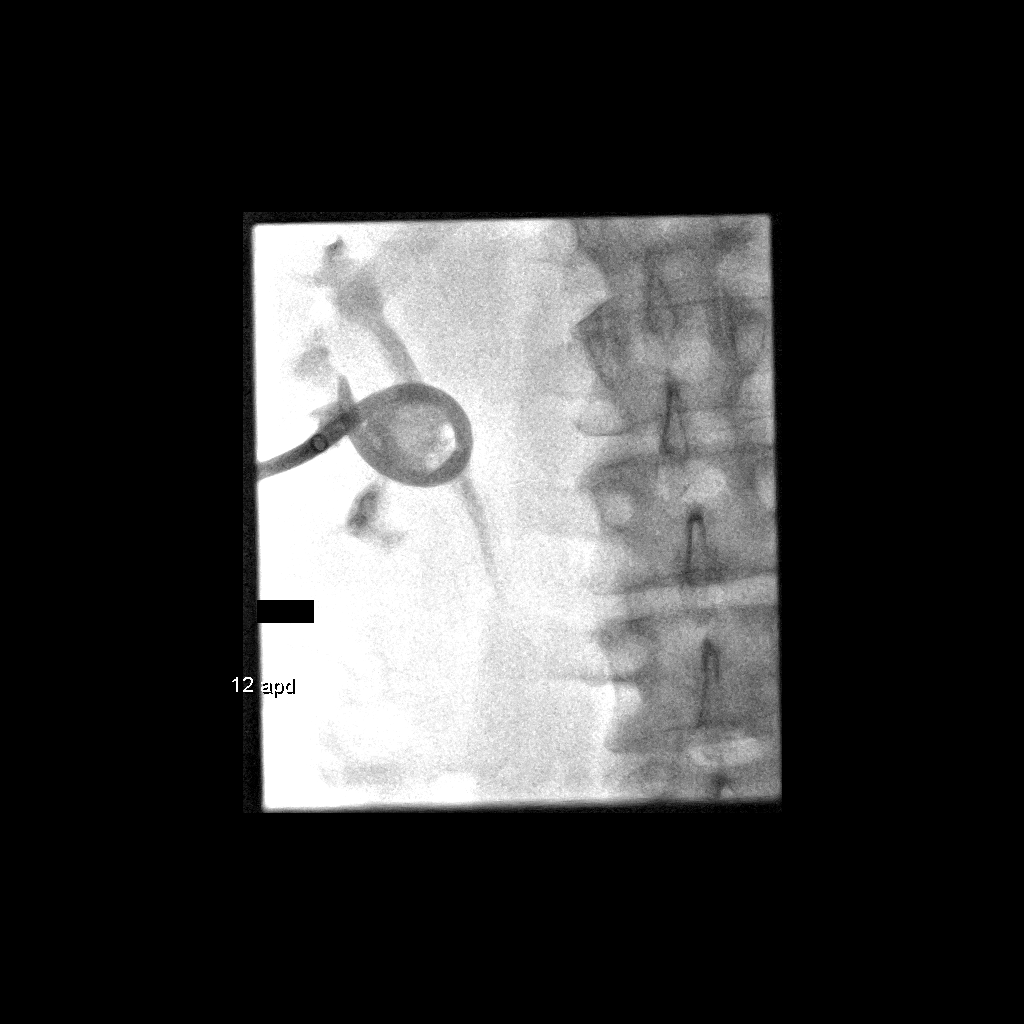
[im 4/4]
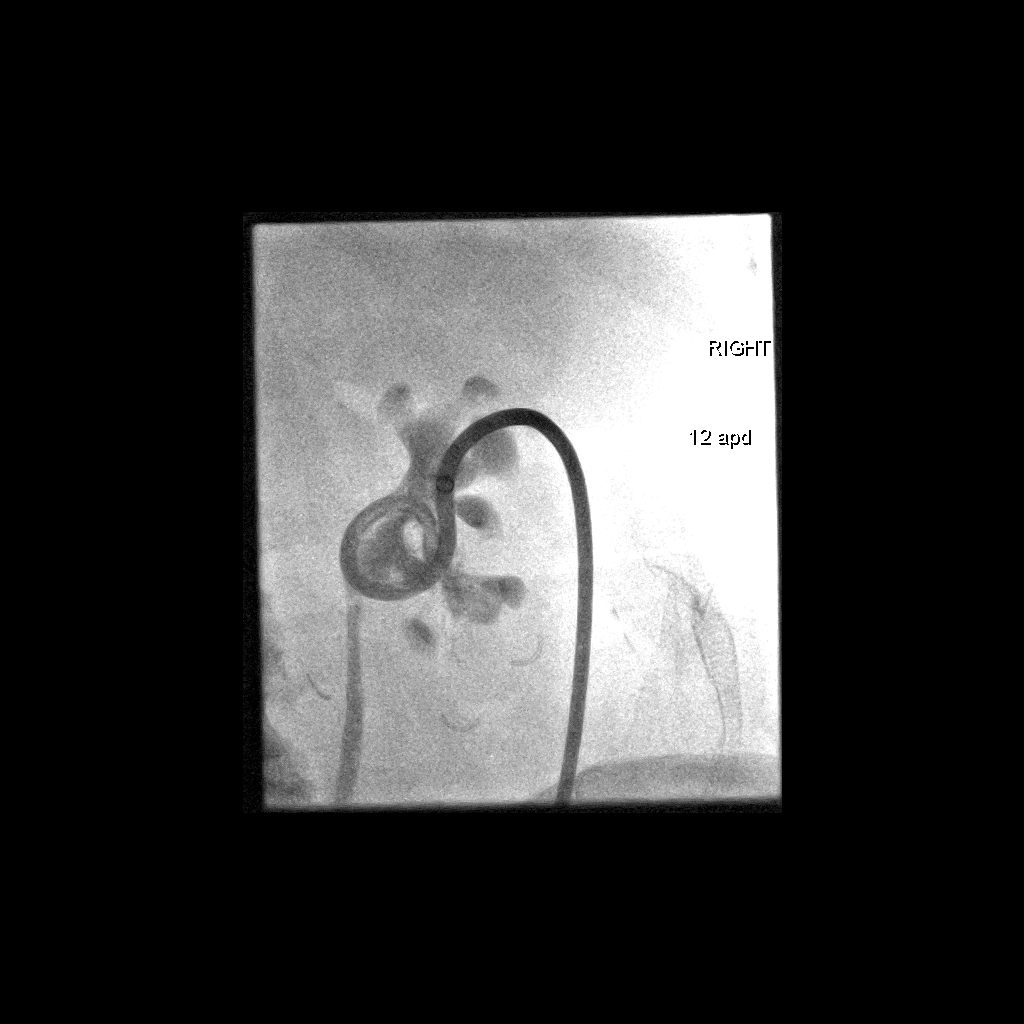

[4 of 4 positions shown; findings below may reference images not displayed]

MEDICATIONS:
None

ANESTHESIA/SEDATION:
None

CONTRAST:  20 cc Usovue-NUU-administered into the collecting
system(s)

FLUOROSCOPY TIME:  Fluoroscopy Time:  minutes 42 seconds (10.6 mGy).

COMPLICATIONS:
None immediate.

PROCEDURE:
Informed written consent was obtained from the patient after a
thorough discussion of the procedural risks, benefits and
alternatives. All questions were addressed. Maximal Sterile Barrier
Technique was utilized including caps, mask, sterile gowns, sterile
gloves, sterile drape, hand hygiene and skin antiseptic. A timeout
was performed prior to the initiation of the procedure.

1% lidocaine was utilized for local anesthesia. The existing left
nephrostomy was cut and exchanged over [place] Sams[place] wire for a new 12
French nephrostomy catheter. It was looped and string fixed in the
left renal pelvis. It was sewn in place with 0 Prolene. Contrast was
injected. The identical procedure was performed for the right
nephrostomy catheter.
FINDINGS: Imaging confirms bilateral 12 French nephrostomy exchange. There are
coiled in the right and left renal pelvises.
IMPRESSION: Successful bilateral 12 French nephrostomy exchange.

## 2019-03-21 NOTE — Progress Notes (Signed)
Office: [phone]  /  Fax: [phone] TeleHealth Visit:  [name] has verbally consented to this TeleHealth visit today. The patient is located at home, the provider is located at the [employer] [facility] office. The participants in this visit include the listed provider and patient. The visit was conducted today via [email or web address].  HPI:   Chief Complaint: OBESITY [name] is here to discuss her progress with her obesity treatment plan. She is on the keep a food journal with 1200-1400 calories and 75+ grams of protein daily and is following her eating plan approximately 80 % of the time. She states she is exercising 0 minutes 0 times per week. [name] feels she is doing well maintaining her weight. Her hunger is controlled, and she is doing well with meal planning and prepping, and meeting her macronutrient goals.  We were unable to weigh the patient today for this TeleHealth visit. She feels as if she has maintained her weight since her last visit. She has lost 18 lbs since starting treatment with [place].  Diabetes II [name] has a diagnosis of diabetes type II. [name] states her fasting BGs mostly average at 120, they rarely go above 150 even after eating. They are greatly improved with diet and weight loss. She is tolerating Januvia well. She denies hypoglycemia. She has been working on intensive lifestyle modifications including diet, exercise, and weight loss to help control her blood glucose levels.  ASSESSMENT AND PLAN:  Type 2 diabetes mellitus with stage 4 chronic kidney disease, without long-term current use of insulin (HCC)  Class 1 obesity with serious comorbidity and body mass index (BMI) of 34.0 to 34.9 in adult, unspecified obesity type  PLAN:  Diabetes II [name] has been given extensive diabetes education by myself today including ideal fasting and post-prandial blood glucose readings, individual ideal Hgb A1c goals and hypoglycemia prevention. We discussed the  importance of good blood sugar control to decrease the likelihood of diabetic complications such as nephropathy, neuropathy, limb loss, blindness, coronary artery disease, and death. We discussed the importance of intensive lifestyle modification including diet, exercise and weight loss as the first line treatment for diabetes. [name] agrees to [employer], and will continue diet, and exercise, and she agrees to follow up with our clinic in 3 weeks.  I spent > than 50% of the 15 minute visit on counseling as documented in the note.  Obesity [name] is currently in the action stage of change. As such, her goal is to continue with weight loss efforts She has agreed to keep a food journal with 1300-1400 calories and 75+ grams of protein daily [name] has been instructed to work up to a goal of 150 minutes of combined cardio and strengthening exercise per week for weight loss and overall health benefits. We discussed the following Behavioral Modification Strategies today: increasing lean protein intake and increase H20 intake   [name] has agreed to follow up with our clinic in 3 weeks. She was informed of the importance of frequent follow up visits to maximize her success with intensive lifestyle modifications for her multiple health conditions.  ALLERGIES: Allergies  Allergen Reactions   Penicillins Swelling    Facial swelling/childhood allergy Has patient had a PCN reaction causing immediate rash, facial/tongue/throat swelling, SOB or lightheadedness with hypotension: Yes Has patient had a PCN reaction causing severe rash involving mucus membranes or skin necrosis: Yes Has patient had a PCN reaction that required hospitalization yes Has patient had a PCN reaction occurring within the  last 10 years: No If all of the above answers are "NO", then may proceed with Cephalosporin use.    Cefaclor Rash    Ceclor   Erythromycin Other (See Comments)    Gastritis, abd cramps   Tape Rash     blisters   Trimethoprim Rash   Ultram [Tramadol] Hives   Cephalosporins Rash   Fluconazole Rash   Oxycodone Other (See Comments)    " I just feel weird"   Pectin Rash    Pectin ring for stoma   Septra [Sulfamethoxazole-Trimethoprim] Rash   Sulfa Antibiotics Rash    MEDICATIONS: Current Outpatient Medications on File Prior to Visit  Medication Sig Dispense Refill   acetaminophen (TYLENOL) 325 MG tablet Take 2 tablets (650 mg total) by mouth every 6 (six) hours as needed. 30 tablet 1   anastrozole (ARIMIDEX) 1 MG tablet Take 1 tablet (1 mg total) by mouth daily. 90 tablet 3   Biotin 5 MG TABS Take 5 mg by mouth daily.      Cholecalciferol (VITAMIN D3) 10000 UNITS capsule Take 10,000 Units by mouth every Sunday.      clobetasol (OLUX) 0.05 % topical foam Apply topically 2 (two) times daily.     diphenhydrAMINE (BENADRYL) 25 mg capsule Take 1 capsule (25 mg total) by mouth every 8 (eight) hours as needed for itching, allergies or sleep. 30 capsule 0   fentaNYL (DURAGESIC) 50 MCG/HR Place 1 patch onto the skin every 3 (three) days. 10 patch 0   ferrous sulfate 325 (65 FE) MG tablet Take 1 tablet (325 mg total) by mouth at bedtime. 30 tablet 3   filgrastim-sndz (ZARXIO) 480 MCG/0.8ML SOSY injection Inject 0.8 mLs (480 mcg total) into the skin as directed. Inject 480 mcg every 5 days 0.8 mL 99   HYDROcodone-acetaminophen (NORCO/VICODIN) 5-325 MG tablet Take 2 tablets by mouth every 6 (six) hours as needed for moderate pain. 30 tablet 0   JANUVIA 50 MG tablet Take 50 mg by mouth daily.      levothyroxine (SYNTHROID, LEVOTHROID) 150 MCG tablet Take 1 tablet (150 mcg total) by mouth daily before breakfast. 30 tablet 1   loratadine (CLARITIN) 10 MG tablet Take 10 mg by mouth daily.      nitrofurantoin, macrocrystal-monohydrate, (MACROBID) 100 MG capsule Take 100 mg by mouth at bedtime.     omega-3 acid ethyl esters (LOVAZA) 1 G capsule Take 1 g by mouth 2 (two) times daily.       Polyethyl Glycol-Propyl Glycol (SYSTANE OP) Place 1 drop into both eyes daily as needed (dry eyes).      Prenatal Vit-Fe Fumarate-FA (PRENATAL VITAMIN PO) Take 1 capsule by mouth daily. Takes prenatal because there are no dyes in it     rosuvastatin (CRESTOR) 10 MG tablet Take 10 mg by mouth every evening.      Saccharomyces boulardii (FLORASTOR PO) Take 1 capsule by mouth daily.     No current facility-administered medications on file prior to visit.     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+, HER2 negative/  12-11-2015 bilateral mastecotmy w/ reconstruction (no radiation and no chemo)   Cancer of corpus uteri, except isthmus (  [facility])  oncologist-- dr Denman [name] and dr Alvy Bimler    10-15-2004  dx endometroid endometrial and ovarian cancer s/p  chemotheapy and surgery(TAH w/ BSO) :  recurrent 11-19-2014 post pelvic surgery and radiation 01-29-2015 to 03-10-2015   Chronic idiopathic neutropenia ([place])    presumed related to chemotherapy March 2006--- followed by dr Alvy Bimler (treatment w/ G-CSF injections   Chronic nausea    Chronic pain    perineal/ anal  area from bladder pad irritates skin , right flank pain   CKD stage G2/A3, GFR 60-89 and albumin creatinine ratio >300 mg/g    nephrologist-  dr Madelon Lips   Colovesical fistula    Diabetic retinopathy, background ([place])    Difficult intravenous access    small veins--- hx PICC lines   DM type 2 (diabetes mellitus, type 2) ([place])    monitored by dr Legrand Como altheimer   Dysuria    Environmental and seasonal allergies    Fatty liver 02/08/2016   CT   Generalized muscle weakness    GERD (gastroesophageal reflux disease)    Hiatal hernia      History of abdominal abscess 04/16/2017   post surgery 04-01-2017  --- resolved 10/ 2018   History of gastric polyp    2014  duodenum   History of ileus 04/16/2017   resolved w/ no surgical intervention   History of radiation therapy    01-29-2015 to 03-10-2015  pelvis 50.4Gy   Hypothyroidism    monitored by dr Legrand Como altheimer   IBS (irritable bowel syndrome)    Ileostomy in place ([place]) 04/01/2017   created at same time colostomy takedown.   Joint pain    Leg edema    Lower urinary tract symptoms (LUTS)    urge urinary  incontinence   Mixed dyslipidemia    Multiple thyroid nodules    Managed by Dr. Harlow Asa   Nephrostomy status [facility])    Palpitations    Pelvic abscess in female 04/16/2017   PONV (postoperative nausea and vomiting)    "scopolamine patch works for me"   Radiation-induced dermatitis    contact dermatitis , radiation completed, rash only on ankles now.   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   Vitamin D deficiency    Wears glasses     PAST SURGICAL HISTORY: Past Surgical History:  Procedure Laterality Date   APPENDECTOMY     biopsy thyroid nodules     BREAST RECONSTRUCTION WITH PLACEMENT OF TISSUE EXPANDER AND FLEX HD (ACELLULAR HYDRATED DERMIS) Bilateral 12/11/2015   Procedure: BILATERAL BREAST RECONSTRUCTION WITH PLACEMENT OF TISSUE EXPANDERS;  Surgeon: Irene Limbo, MD;  Location: [place];  Service: Plastics;  Laterality: Bilateral;   COLONOSCOPY WITH PROPOFOL N/A 08/21/2013   Procedure: COLONOSCOPY WITH PROPOFOL;  Surgeon: Cleotis Nipper, MD;  [facility];  Service: Endoscopy;  Laterality: N/A;   COLOSTOMY TAKEDOWN N/A 12/04/2014   Procedure: LAPROSCOPIC LYSIS OF ADHESIONS, SPLENIC MOBILIZATION, RELOCATION OF COLOSTOMY, DEBRIDEMENT INITIAL COLOSTOMY SITE;   Surgeon: Michael [name], MD;  [facility];  Service: General;  Laterality: N/A;   CYSTOGRAM N/A 06/01/2017   Procedure: CYSTOGRAM;  Surgeon: Alexis Frock, MD;  [facility];  Service: Urology;  Laterality: N/A;   CYSTOSCOPY W/ RETROGRADES Right 11/21/2015   Procedure: CYSTOSCOPY WITH RETROGRADE PYELOGRAM;  Surgeon: Alexis Frock, MD;  [facility];  Service:  Urology;  Laterality: Right;   CYSTOSCOPY W/ URETERAL STENT PLACEMENT Right 11/21/2015   Procedure: CYSTOSCOPY WITH STENT REPLACEMENT;  Surgeon: Alexis Frock, MD;  [facility];  Service: Urology;  Laterality: Right;   CYSTOSCOPY W/ URETERAL STENT PLACEMENT Right 03/10/2016   Procedure: CYSTOSCOPY WITH STENT REPLACEMENT;  Surgeon: Alexis Frock, MD;  [facility];  Service: Urology;  Laterality: Right;   CYSTOSCOPY W/ URETERAL STENT PLACEMENT Right 06/30/2016   Procedure: CYSTOSCOPY WITH RETROGRADE PYELOGRAM/URETERAL STENT EXCHANGE;  Surgeon: Alexis Frock, MD;  Location: [facility];  Service: Urology;  Laterality: Right;   CYSTOSCOPY W/ URETERAL STENT PLACEMENT N/A 06/01/2017   Procedure: CYSTOSCOPY WITH EXAM UNDER ANESTHESIA;  Surgeon: Alexis Frock, MD;  [facility];  Service: Urology;  Laterality: N/A;   CYSTOSCOPY W/ URETERAL STENT PLACEMENT Right 08/17/2017   Procedure: CYSTOSCOPY WITH RETROGRADE PYELOGRAM/URETERAL STENT REMOVAL;  Surgeon: Alexis Frock, MD;  Location: [facility];  Service: Urology;  Laterality: Right;   CYSTOSCOPY WITH RETROGRADE PYELOGRAM, URETEROSCOPY AND STENT PLACEMENT Right 03/20/2015   Procedure: CYSTOSCOPY WITH RETROGRADE PYELOGRAM, URETEROSCOPY WITH BALLOON DILATION AND STENT PLACEMENT ON RIGHT;  Surgeon: Alexis Frock, MD;  Location: [facility];  Service: Urology;  Laterality: Right;   CYSTOSCOPY WITH RETROGRADE PYELOGRAM, URETEROSCOPY AND STENT PLACEMENT Right 05/02/2015   Procedure: CYSTOSCOPY WITH RIGHT  RETROGRADE PYELOGRAM,  DIAGNOSTIC URETEROSCOPY AND STENT PULL ;  Surgeon: Alexis Frock, MD;  Location: [facility];  Service: Urology;  Laterality: Right;   CYSTOSCOPY WITH RETROGRADE PYELOGRAM, URETEROSCOPY AND STENT PLACEMENT Right 09/05/2015   Procedure: CYSTOSCOPY WITH RETROGRADE PYELOGRAM,  AND STENT PLACEMENT;  Surgeon: Alexis Frock, MD;  [facility];  Service: Urology;  Laterality: Right;   CYSTOSCOPY WITH RETROGRADE PYELOGRAM, URETEROSCOPY AND STENT PLACEMENT Right 04/01/2017   Procedure: CYSTOSCOPY WITH RETROGRADE PYELOGRAM, URETEROSCOPY AND STENT PLACEMENT;  Surgeon: Alexis Frock, MD;  [facility];  Service: Urology;  Laterality: Right;   CYSTOSCOPY WITH STENT PLACEMENT Right 10/27/2016   Procedure: CYSTOSCOPY WITH STENT CHANGE and right retrograde pyelogram;  Surgeon: Alexis Frock, MD;  Location: [facility];  Service: Urology;  Laterality: Right;   EUS N/A 10/02/2014   Procedure: LOWER ENDOSCOPIC ULTRASOUND (EUS);  Surgeon: Arta Silence, MD;  Location: [facility];  Service: Endoscopy;  Laterality: N/A;   EXCISION SOFT TISSUE MASS RIGHT FOREMAN  12-08-2006   EYE SURGERY  as child   pytosis of eyelids repair   INCISION AND DRAINAGE OF WOUND Bilateral 12/26/2015   Procedure: DEBRIDEMENT OF BILATERAL MASTECTOMY FLAPS;  Surgeon: Irene Limbo, MD;  Location: [place];  Service: Plastics;  Laterality: Bilateral;   IR CV LINE INJECTION  05/31/2017   IR FLUORO GUIDE CV LINE LEFT  05/31/2017   IR FLUORO GUIDE CV LINE RIGHT  04/06/2017   IR FLUORO GUIDE CV MIDLINE PICC RIGHT  05/30/2017   IR NEPHROSTOGRAM LEFT INITIAL PLACEMENT  09/02/2017   IR NEPHROSTOGRAM LEFT THRU EXISTING ACCESS  11/29/2017   IR NEPHROSTOGRAM RIGHT INITIAL PLACEMENT  09/02/2017   IR NEPHROSTOGRAM RIGHT THRU EXISTING ACCESS  09/13/2017   IR NEPHROSTOGRAM RIGHT THRU EXISTING ACCESS  11/29/2017   IR NEPHROSTOMY EXCHANGE LEFT  11/28/2017   IR  NEPHROSTOMY EXCHANGE LEFT  01/05/2018   IR NEPHROSTOMY EXCHANGE LEFT  02/16/2018   IR NEPHROSTOMY EXCHANGE LEFT  03/30/2018   IR NEPHROSTOMY EXCHANGE LEFT  05/12/2018   IR NEPHROSTOMY EXCHANGE LEFT  06/21/2018   IR NEPHROSTOMY EXCHANGE LEFT  08/04/2018  IR NEPHROSTOMY EXCHANGE LEFT  09/18/2018   IR NEPHROSTOMY EXCHANGE LEFT  10/09/2018   IR NEPHROSTOMY EXCHANGE LEFT  10/27/2018   IR NEPHROSTOMY EXCHANGE LEFT  11/21/2018   IR NEPHROSTOMY EXCHANGE LEFT  01/05/2019   IR NEPHROSTOMY EXCHANGE LEFT  02/15/2019   IR NEPHROSTOMY EXCHANGE RIGHT  10/02/2017   IR NEPHROSTOMY EXCHANGE RIGHT  11/28/2017   IR NEPHROSTOMY EXCHANGE RIGHT  01/05/2018   IR NEPHROSTOMY EXCHANGE RIGHT  02/16/2018   IR NEPHROSTOMY EXCHANGE RIGHT  03/30/2018   IR NEPHROSTOMY EXCHANGE RIGHT  05/12/2018   IR NEPHROSTOMY EXCHANGE RIGHT  06/21/2018   IR NEPHROSTOMY EXCHANGE RIGHT  08/04/2018   IR NEPHROSTOMY EXCHANGE RIGHT  09/18/2018   IR NEPHROSTOMY EXCHANGE RIGHT  10/27/2018   IR NEPHROSTOMY EXCHANGE RIGHT  11/21/2018   IR NEPHROSTOMY EXCHANGE RIGHT  01/05/2019   IR NEPHROSTOMY EXCHANGE RIGHT  02/15/2019   IR NEPHROSTOMY PLACEMENT LEFT  10/02/2017   IR RADIOLOGIST EVAL & MGMT  05/03/2017   IR US GUIDE VASC ACCESS LEFT  05/31/2017   IR US GUIDE VASC ACCESS RIGHT  04/06/2017   IR US GUIDE VASC ACCESS RIGHT  05/30/2017   LAPAROSCOPIC CHOLECYSTECTOMY  1990   LIPOSUCTION WITH LIPOFILLING Bilateral 04/16/2016   Procedure: LIPOSUCTION WITH LIPOFILLING TO BILATERAL CHEST;  Surgeon: Irene Limbo, MD;  Location: [place];  Service: Plastics;  Laterality: Bilateral;   MASTECTOMY W/ SENTINEL NODE BIOPSY Bilateral 12/11/2015   Procedure: RIGHT PROPHYLACTIC MASTECTOMY, LEFT TOTAL MASTECTOMY WITH LEFT AXILLARY SENTINEL LYMPH NODE BIOPSY;  Surgeon: Stark Klein, MD;  Location: [place];  Service: General;  Laterality: Bilateral;   OSTOMY N/A 11/19/2014   Procedure: OSTOMY;  Surgeon: Michael [name], MD;  [facility];   Service: General;  Laterality: N/A;   PROCTOSCOPY N/A 04/01/2017   Procedure: RIDGE PROCTOSCOPY;  Surgeon: Michael [name], MD;  [facility];  Service: General;  Laterality: N/A;   REMOVAL OF BILATERAL TISSUE EXPANDERS WITH PLACEMENT OF BILATERAL BREAST IMPLANTS Bilateral 04/16/2016   Procedure: REMOVAL OF BILATERAL TISSUE EXPANDERS WITH PLACEMENT OF BILATERAL BREAST IMPLANTS;  Surgeon: Irene Limbo, MD;  Location: [place];  Service: Plastics;  Laterality: Bilateral;   ROBOTIC ASSISTED LAP VAGINAL HYSTERECTOMY N/A 11/19/2014   Procedure: ROBOTIC LYSIS OF ADHESIONS, CONVERTED TO LAPAROTOMY RADICAL UPPER VAGINECTOMY,LOW ANTERIOR BOWEL RESECTION, COLOSTOMY, BILATERAL URETERAL STENT PLACEMENT AND CYSTONOMY CLOSURE;  Surgeon: Everitt Amber, MD;  [facility];  Service: Gynecology;  Laterality: N/A;   TISSUE EXPANDER FILLING Bilateral 12/26/2015   Procedure: EXPANSION OF BILATERAL CHEST TISSUE EXPANDERS (60 mL- Right; 75 mL- Left);  Surgeon: Irene Limbo, MD;  Location: [place];  Service: Plastics;  Laterality: Bilateral;   TONSILLECTOMY     TOTAL ABDOMINAL HYSTERECTOMY  March 2006   [facility]   and Bilateral Salpingoophorectomy/  staging for Ovarian cancer/  an   XI ROBOTIC ASSISTED LOWER ANTERIOR RESECTION N/A 04/01/2017   Procedure: XI ROBOTIC VS LAPAROSCOPIC COLOSTOMY TAKEDOWN WITH LYSIS OF ADHESIONS.;  Surgeon: Michael [name], MD;  [facility];  Service: General;  Laterality: N/A;  ERAS PATHWAY    SOCIAL HISTORY: Social History   Tobacco Use   Smoking status: Never Smoker   Smokeless tobacco: Never Used  Substance Use Topics   Alcohol use: Not Currently   Drug use: No    FAMILY HISTORY: Family History  Problem Relation Age of Onset   Cancer Mother 27       stomach ca   Hypertension Mother    Cancer  Father 28       prostate ca   Diabetes Father    Heart disease Father        CABG   Hypertension Father     Hyperlipidemia Father    Obesity Father    Breast cancer Maternal Aunt        dx in her 80s   Lymphoma Paternal 29    Brain cancer Paternal Grandfather    Ovarian cancer Other    Diabetes Sister    Hypertension Brother y-10   Heart disease Brother        CABG   Diabetes Brother     ROS: Review of Systems  Constitutional: Negative for weight loss.  Endo/Heme/Allergies:       Negative hypoglycemia    PHYSICAL EXAM: Pt in no acute distress  RECENT LABS AND TESTS: BMET    Component Value Date/Time   NA 137 02/15/2019 1312   NA 142 11/29/2018   NA 141 01/24/2017 1228   K 4.1 02/15/2019 1312   K 4.6 01/24/2017 1228   CL 99 02/15/2019 1312   CO2 26 02/15/2019 1312   CO2 29 01/24/2017 1228   GLUCOSE 143 (H) 02/15/2019 1312   GLUCOSE 84 01/24/2017 1228   BUN 30 (H) 02/15/2019 1312   BUN 22 (A) 11/29/2018   BUN 22.2 01/24/2017 1228   CREATININE 1.55 (H) 02/15/2019 1312   CREATININE 1.98 (H) 03/27/2018 1324   CREATININE 0.8 01/24/2017 1228   CALCIUM 9.4 02/15/2019 1312   CALCIUM 10.2 01/24/2017 1228   GFRNONAA 35 (L) 02/15/2019 1312   GFRNONAA 26 (L) 03/27/2018 1324   GFRNONAA 78 12/16/2014 1530   GFRAA 41 (L) 02/15/2019 1312   GFRAA 30 (L) 03/27/2018 1324   GFRAA >89 12/16/2014 1530   Lab Results  Component Value Date   HGBA1C 7.9 (H) 09/01/2017   HGBA1C 5.3 04/01/2017   HGBA1C 5.8 (H) 09/05/2015   HGBA1C 6.8 (H) 11/21/2014   HGBA1C 6.3 (H) 11/07/2014   No results found for: INSULIN CBC    Component Value Date/Time   WBC 3.8 (L) 02/15/2019 1312   RBC 3.69 (L) 02/15/2019 1312   HGB 10.6 (L) 02/15/2019 1312   HGB 10.2 (L) 03/27/2018 1324   HGB 12.4 07/29/2017 1444   HCT 36.0 02/15/2019 1312   HCT 38.0 07/29/2017 1444   PLT 291 02/15/2019 1312   PLT 268 03/27/2018 1324   PLT 260 07/29/2017 1444   MCV 97.6 02/15/2019 1312   MCV 90.9 07/29/2017 1444   MCH 28.7 02/15/2019 1312   MCHC 29.4 (L) 02/15/2019 1312   RDW 13.6 02/15/2019 1312   RDW  15.5 (H) 07/29/2017 1444   LYMPHSABS 0.7 02/15/2019 1312   LYMPHSABS 0.8 (L) 07/29/2017 1444   MONOABS 0.7 02/15/2019 1312   MONOABS 1.0 (H) 07/29/2017 1444   EOSABS 0.2 02/15/2019 1312   EOSABS 0.0 07/29/2017 1444   BASOSABS 0.0 02/15/2019 1312   BASOSABS 0.0 07/29/2017 1444   Iron/TIBC/Ferritin/ %Sat    Component Value Date/Time   IRON 7 (L) 08/31/2017 0451   IRON 12 (L) 11/29/2014 1251   TIBC 164 (L) 08/31/2017 0451   TIBC 331 11/29/2014 1251   FERRITIN 27 11/29/2014 1251   IRONPCTSAT 4 (L) 08/31/2017 0451   IRONPCTSAT 4 (L) 11/29/2014 1251   Lipid Panel     Component Value Date/Time   CHOL 109 08/28/2015 1617   TRIG 89 04/18/2017 0427   HDL 43.80 08/28/2015 1617   CHOLHDL 2 08/28/2015 1617  VLDL 19.8 08/28/2015 1617   LDLCALC 45 08/28/2015 1617   Hepatic Function Panel     Component Value Date/Time   PROT 7.4 01/17/2019 0351   PROT 6.9 01/24/2017 1228   ALBUMIN 2.4 (L) 01/22/2019 0343   ALBUMIN 3.4 (L) 01/24/2017 1228   AST 24 01/17/2019 0351   AST 14 (L) 03/27/2018 1324   AST 16 01/24/2017 1228   ALT 53 (H) 01/17/2019 0351   ALT 18 03/27/2018 1324   ALT 14 01/24/2017 1228   ALKPHOS 305 (H) 01/17/2019 0351   ALKPHOS 90 01/24/2017 1228   BILITOT 0.6 01/17/2019 0351   BILITOT 0.2 (L) 03/27/2018 1324   BILITOT 0.34 01/24/2017 1228      Component Value Date/Time   TSH 2.408 08/30/2017 0623   TSH 0.63 08/28/2015 1617      I, Trixie Dredge, am acting as transcriptionist for Dennard Nip, MD I have reviewed the above documentation for accuracy and completeness, and I agree with the above. -Dennard Nip, MD

## 2019-03-23 DIAGNOSIS — L89309 Pressure ulcer of unspecified buttock, unspecified stage: Secondary | ICD-10-CM | POA: Diagnosis not present

## 2019-03-23 DIAGNOSIS — E785 Hyperlipidemia, unspecified: Secondary | ICD-10-CM | POA: Diagnosis not present

## 2019-03-23 DIAGNOSIS — E860 Dehydration: Secondary | ICD-10-CM | POA: Diagnosis not present

## 2019-03-26 DIAGNOSIS — E860 Dehydration: Secondary | ICD-10-CM | POA: Diagnosis not present

## 2019-03-26 DIAGNOSIS — L89309 Pressure ulcer of unspecified buttock, unspecified stage: Secondary | ICD-10-CM | POA: Diagnosis not present

## 2019-03-26 DIAGNOSIS — E785 Hyperlipidemia, unspecified: Secondary | ICD-10-CM | POA: Diagnosis not present

## 2019-03-27 DIAGNOSIS — I440 Atrioventricular block, first degree: Secondary | ICD-10-CM | POA: Diagnosis not present

## 2019-03-27 DIAGNOSIS — N321 Vesicointestinal fistula: Secondary | ICD-10-CM | POA: Diagnosis not present

## 2019-03-27 DIAGNOSIS — Z79899 Other long term (current) drug therapy: Secondary | ICD-10-CM | POA: Diagnosis not present

## 2019-03-27 DIAGNOSIS — K604 Rectal fistula: Secondary | ICD-10-CM | POA: Diagnosis not present

## 2019-03-28 ENCOUNTER — Encounter: Payer: Self-pay | Admitting: Family Medicine

## 2019-03-28 ENCOUNTER — Other Ambulatory Visit: Payer: Self-pay | Admitting: Student

## 2019-03-28 DIAGNOSIS — E785 Hyperlipidemia, unspecified: Secondary | ICD-10-CM | POA: Diagnosis not present

## 2019-03-28 DIAGNOSIS — T82594D Other mechanical complication of infusion catheter, subsequent encounter: Secondary | ICD-10-CM | POA: Diagnosis not present

## 2019-03-28 DIAGNOSIS — E860 Dehydration: Secondary | ICD-10-CM | POA: Diagnosis not present

## 2019-03-28 DIAGNOSIS — L89309 Pressure ulcer of unspecified buttock, unspecified stage: Secondary | ICD-10-CM | POA: Diagnosis not present

## 2019-03-28 DIAGNOSIS — T83592A Infection and inflammatory reaction due to indwelling ureteral stent, initial encounter: Secondary | ICD-10-CM | POA: Diagnosis not present

## 2019-03-28 NOTE — Telephone Encounter (Signed)
Last written: 02/23/19 Last ov: 02/26/19 Next ov: none Contract: due UDS: due

## 2019-03-29 ENCOUNTER — Other Ambulatory Visit: Payer: Self-pay | Admitting: Family Medicine

## 2019-03-29 ENCOUNTER — Ambulatory Visit (HOSPITAL_COMMUNITY)
Admission: RE | Admit: 2019-03-29 | Discharge: 2019-03-29 | Disposition: A | Discharge status: Home / Self Care | Payer: BC Managed Care – PPO | Source: Ambulatory Visit | Attending: Interventional Radiology | Admitting: Interventional Radiology

## 2019-03-29 ENCOUNTER — Other Ambulatory Visit: Payer: Self-pay

## 2019-03-29 ENCOUNTER — Other Ambulatory Visit (HOSPITAL_COMMUNITY): Payer: Self-pay | Admitting: Interventional Radiology

## 2019-03-29 ENCOUNTER — Encounter (HOSPITAL_COMMUNITY): Payer: Self-pay

## 2019-03-29 DIAGNOSIS — K449 Diaphragmatic hernia without obstruction or gangrene: Secondary | ICD-10-CM | POA: Insufficient documentation

## 2019-03-29 DIAGNOSIS — C541 Malignant neoplasm of endometrium: Secondary | ICD-10-CM

## 2019-03-29 DIAGNOSIS — K219 Gastro-esophageal reflux disease without esophagitis: Secondary | ICD-10-CM | POA: Insufficient documentation

## 2019-03-29 DIAGNOSIS — C569 Malignant neoplasm of unspecified ovary: Secondary | ICD-10-CM

## 2019-03-29 DIAGNOSIS — E039 Hypothyroidism, unspecified: Secondary | ICD-10-CM | POA: Insufficient documentation

## 2019-03-29 DIAGNOSIS — C55 Malignant neoplasm of uterus, part unspecified: Secondary | ICD-10-CM

## 2019-03-29 DIAGNOSIS — Z79899 Other long term (current) drug therapy: Secondary | ICD-10-CM | POA: Diagnosis not present

## 2019-03-29 DIAGNOSIS — Z853 Personal history of malignant neoplasm of breast: Secondary | ICD-10-CM | POA: Insufficient documentation

## 2019-03-29 DIAGNOSIS — Z79890 Hormone replacement therapy (postmenopausal): Secondary | ICD-10-CM | POA: Diagnosis not present

## 2019-03-29 DIAGNOSIS — N182 Chronic kidney disease, stage 2 (mild): Secondary | ICD-10-CM | POA: Diagnosis not present

## 2019-03-29 DIAGNOSIS — D631 Anemia in chronic kidney disease: Secondary | ICD-10-CM | POA: Diagnosis not present

## 2019-03-29 DIAGNOSIS — Z436 Encounter for attention to other artificial openings of urinary tract: Secondary | ICD-10-CM | POA: Diagnosis not present

## 2019-03-29 DIAGNOSIS — I129 Hypertensive chronic kidney disease with stage 1 through stage 4 chronic kidney disease, or unspecified chronic kidney disease: Secondary | ICD-10-CM | POA: Insufficient documentation

## 2019-03-29 DIAGNOSIS — E1122 Type 2 diabetes mellitus with diabetic chronic kidney disease: Secondary | ICD-10-CM | POA: Diagnosis not present

## 2019-03-29 HISTORY — PX: IR NEPHROSTOMY EXCHANGE LEFT: IMG6069

## 2019-03-29 HISTORY — PX: IR NEPHROSTOMY EXCHANGE RIGHT: IMG6070

## 2019-03-29 LAB — BASIC METABOLIC PANEL
Anion gap: 9 (ref 5–15)
BUN: 28 mg/dL — ABNORMAL HIGH (ref 8–23)
CO2: 24 mmol/L (ref 22–32)
Calcium: 9.4 mg/dL (ref 8.9–10.3)
Chloride: 105 mmol/L (ref 98–111)
Creatinine, Ser: 1.56 mg/dL — ABNORMAL HIGH (ref 0.44–1.00)
GFR calc Af Amer: 40 mL/min — ABNORMAL LOW (ref >60)
GFR calc non Af Amer: 35 mL/min — ABNORMAL LOW (ref >60)
Glucose, Bld: 111 mg/dL — ABNORMAL HIGH (ref 70–99)
Potassium: 4.1 mmol/L (ref 3.5–5.1)
Sodium: 138 mmol/L (ref 135–145)

## 2019-03-29 LAB — CBC
HCT: 36 % (ref 36.0–46.0)
Hemoglobin: 11 g/dL — ABNORMAL LOW (ref 12.0–15.0)
MCH: 29.8 pg (ref 26.0–34.0)
MCHC: 30.6 g/dL (ref 30.0–36.0)
MCV: 97.6 fL (ref 80.0–100.0)
Platelets: 210 K/uL (ref 150–400)
RBC: 3.69 MIL/uL — ABNORMAL LOW (ref 3.87–5.11)
RDW: 15.1 % (ref 11.5–15.5)
WBC: 3.1 K/uL — ABNORMAL LOW (ref 4.0–10.5)
nRBC: 0 % (ref 0.0–0.2)

## 2019-03-29 LAB — PROTIME-INR
INR: 1 (ref 0.8–1.2)
Prothrombin Time: 12.9 s (ref 11.4–15.2)

## 2019-03-29 LAB — GLUCOSE, CAPILLARY: Glucose-Capillary: 112 mg/dL — ABNORMAL HIGH (ref 70–99)

## 2019-03-29 MED ORDER — MIDAZOLAM HCL 2 MG/2ML IJ SOLN
INTRAMUSCULAR | Status: AC
  Filled 2019-03-29: qty 4

## 2019-03-29 MED ORDER — MIDAZOLAM HCL 2 MG/2ML IJ SOLN
INTRAMUSCULAR | Status: AC | PRN
  Administered 2019-03-29 (×2): 1 mg via INTRAVENOUS

## 2019-03-29 MED ORDER — FENTANYL 50 MCG/HR TD PT72: 1.0000 | MEDICATED_PATCH | TRANSDERMAL | 0 refills | Status: DC

## 2019-03-29 MED ORDER — IOHEXOL 300 MG/ML  SOLN
50.0000 mL | Freq: Once | INTRAMUSCULAR | Status: AC | PRN
  Administered 2019-03-29: 10 mL

## 2019-03-29 MED ORDER — FENTANYL CITRATE (PF) 100 MCG/2ML IJ SOLN
INTRAMUSCULAR | Status: AC
  Filled 2019-03-29: qty 2

## 2019-03-29 MED ORDER — FENTANYL CITRATE (PF) 100 MCG/2ML IJ SOLN
INTRAMUSCULAR | Status: AC | PRN
  Administered 2019-03-29 (×2): 50 ug via INTRAVENOUS

## 2019-03-29 MED ORDER — SODIUM CHLORIDE 0.9 % IV SOLN
INTRAVENOUS | Status: DC
  Administered 2019-03-29: 12:00:00 via INTRAVENOUS

## 2019-03-29 MED ORDER — CIPROFLOXACIN IN D5W 400 MG/200ML IV SOLN: 400.0000 mg | Freq: Once | INTRAVENOUS | Status: DC

## 2019-03-29 MED ORDER — HYDROCODONE-ACETAMINOPHEN 5-325 MG PO TABS: 2.0000 | ORAL_TABLET | Freq: Four times a day (QID) | ORAL | 0 refills | Status: DC | PRN

## 2019-03-29 NOTE — H&P (Signed)
Chief Complaint: Patient was seen in consultation today for ureteral obstruction s/p bilateral nephrostomy tube placements/bilateral nephrostomy tube exchanges.  Referring Physician(s): Alexis Frock  Supervising Physician: Aletta Edouard  Patient Status: [facility] - Out-pt  History of Present Illness: [name] is a 63 y.o. 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 She has chronic bilateral nephrostomy tubes placed due to urethral obstruction secondary to endometrial cancer, tubes first placed in IR 09/02/2017 by Dr. Earleen Newport. She has these tubes exchanged every 6-12 weeks, most recently exchanged in IR 02/15/2019 by Dr. Pascal Lux.  Patient presents today for routine image-guided bilateral percutaneous nephrostomy tube exchanges. Patient awake and alert laying in bed. Complains of right flank pain, states that she was recently at [facility] and they were "messing" with her tubes, and thinks this is cause of pain. Denies N/V associated with flank pain. Denies fever, chills, chest pain, dyspnea, abdominal pain, or headache.   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+, HER2 negative/  12-11-2015 bilateral mastecotmy w/ reconstruction (no radiation and no chemo)   Cancer of corpus uteri, except isthmus [facility])  oncologist-- dr Denman [name] and dr Alvy Bimler    10-15-2004  dx endometroid endometrial and ovarian cancer s/p   chemotheapy and surgery(TAH w/ BSO) :  recurrent 11-19-2014 post pelvic surgery and radiation 01-29-2015 to 03-10-2015   Chronic idiopathic neutropenia ([place])    presumed related to chemotherapy March 2006--- followed by dr Alvy Bimler (treatment w/ G-CSF injections   Chronic nausea    Chronic pain    perineal/ anal  area from bladder pad irritates skin , right flank pain   CKD stage G2/A3, GFR 60-89 and albumin creatinine ratio >300 mg/g    nephrologist-  dr Madelon Lips   Colovesical fistula    Diabetic retinopathy, background ([place])    Difficult intravenous access    small veins--- hx PICC lines   DM type 2 (diabetes mellitus, type 2) ([place])    monitored by dr Legrand Como altheimer   Dysuria    Environmental and seasonal allergies    Fatty liver 02/08/2016   CT   Generalized muscle weakness    GERD (gastroesophageal reflux disease)    Hiatal hernia    History of abdominal abscess 04/16/2017   post surgery 04-01-2017  --- resolved 10/ 2018   History of gastric polyp    2014  duodenum   History of ileus 04/16/2017   resolved w/ no surgical intervention   History of radiation therapy    01-29-2015 to 03-10-2015  pelvis 50.4Gy   Hypothyroidism    monitored by dr Legrand Como altheimer   IBS (irritable bowel syndrome)    Ileostomy in place ([place]) 04/01/2017   created at same time colostomy takedown.   Joint pain    Leg edema    Lower urinary tract symptoms (LUTS)    urge urinary  incontinence   Mixed dyslipidemia    Multiple thyroid nodules    Managed by Dr. Harlow Asa  Nephrostomy status ([place])    Palpitations    Pelvic abscess in female 04/16/2017   PONV (postoperative nausea and vomiting)    "scopolamine patch works for me"   Radiation-induced dermatitis    contact dermatitis , radiation completed, rash only on ankles now.   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   Vitamin D deficiency    Wears glasses     Past Surgical History:  Procedure Laterality Date   APPENDECTOMY     biopsy thyroid nodules     BREAST RECONSTRUCTION WITH PLACEMENT OF TISSUE EXPANDER AND FLEX HD (ACELLULAR HYDRATED DERMIS) Bilateral 12/11/2015   Procedure: BILATERAL BREAST RECONSTRUCTION WITH PLACEMENT OF TISSUE EXPANDERS;  Surgeon: Irene Limbo, MD;  Location: [place];  Service: Plastics;  Laterality: Bilateral;   COLONOSCOPY WITH PROPOFOL N/A 08/21/2013   Procedure: COLONOSCOPY WITH PROPOFOL;  Surgeon: Cleotis Nipper, MD;  [facility];  Service: Endoscopy;  Laterality: N/A;   COLOSTOMY TAKEDOWN N/A 12/04/2014   Procedure: LAPROSCOPIC LYSIS OF ADHESIONS, SPLENIC MOBILIZATION, RELOCATION OF COLOSTOMY, DEBRIDEMENT INITIAL COLOSTOMY SITE;  Surgeon: Michael [name], MD;  [facility];  Service: General;  Laterality: N/A;   CYSTOGRAM N/A 06/01/2017   Procedure: CYSTOGRAM;  Surgeon: Alexis Frock, MD;  [facility];  Service: Urology;  Laterality: N/A;   CYSTOSCOPY W/ RETROGRADES Right 11/21/2015   Procedure: CYSTOSCOPY WITH RETROGRADE PYELOGRAM;  Surgeon: Alexis Frock, MD;  [facility];  Service: Urology;  Laterality: Right;   CYSTOSCOPY W/ URETERAL STENT PLACEMENT Right 11/21/2015   Procedure: CYSTOSCOPY WITH STENT REPLACEMENT;  Surgeon: Alexis Frock, MD;  [facility];  Service: Urology;  Laterality: Right;   CYSTOSCOPY W/ URETERAL STENT PLACEMENT Right 03/10/2016   Procedure: CYSTOSCOPY WITH STENT REPLACEMENT;  Surgeon: Alexis Frock, MD;  Location: [facility];  Service: Urology;  Laterality: Right;   CYSTOSCOPY W/ URETERAL STENT PLACEMENT Right 06/30/2016   Procedure: CYSTOSCOPY WITH RETROGRADE PYELOGRAM/URETERAL STENT EXCHANGE;  Surgeon: Alexis Frock, MD;  Location: [facility];  Service: Urology;  Laterality: Right;   CYSTOSCOPY W/  URETERAL STENT PLACEMENT N/A 06/01/2017   Procedure: CYSTOSCOPY WITH EXAM UNDER ANESTHESIA;  Surgeon: Alexis Frock, MD;  [facility];  Service: Urology;  Laterality: N/A;   CYSTOSCOPY W/ URETERAL STENT PLACEMENT Right 08/17/2017   Procedure: CYSTOSCOPY WITH RETROGRADE PYELOGRAM/URETERAL STENT REMOVAL;  Surgeon: Alexis Frock, MD;  Location[facility];  Service: Urology;  Laterality: Right;   CYSTOSCOPY WITH RETROGRADE PYELOGRAM, URETEROSCOPY AND STENT PLACEMENT Right 03/20/2015   Procedure: CYSTOSCOPY WITH RETROGRADE PYELOGRAM, URETEROSCOPY WITH BALLOON DILATION AND STENT PLACEMENT ON RIGHT;  Surgeon: Alexis Frock, MD;  Location: [facility];  Service: Urology;  Laterality: Right;   CYSTOSCOPY WITH RETROGRADE PYELOGRAM, URETEROSCOPY AND STENT PLACEMENT Right 05/02/2015   Procedure: CYSTOSCOPY WITH RIGHT RETROGRADE PYELOGRAM,  DIAGNOSTIC URETEROSCOPY AND STENT PULL ;  Surgeon: Alexis Frock, MD;  Location: [facility];  Service: Urology;  Laterality: Right;   CYSTOSCOPY WITH RETROGRADE PYELOGRAM, URETEROSCOPY AND STENT PLACEMENT Right 09/05/2015   Procedure: CYSTOSCOPY WITH RETROGRADE PYELOGRAM,  AND STENT PLACEMENT;  Surgeon: Alexis Frock, MD;  [facility];  Service: Urology;  Laterality: Right;   CYSTOSCOPY WITH RETROGRADE PYELOGRAM, URETEROSCOPY AND STENT PLACEMENT Right 04/01/2017   Procedure: CYSTOSCOPY WITH RETROGRADE PYELOGRAM, URETEROSCOPY AND STENT PLACEMENT;  Surgeon: Alexis Frock,  MD;  [facility];  Service: Urology;  Laterality: Right;   CYSTOSCOPY WITH STENT PLACEMENT Right 10/27/2016   Procedure: CYSTOSCOPY WITH STENT CHANGE and right retrograde pyelogram;  Surgeon: Alexis Frock, MD;  Location: [facility];  Service: Urology;  Laterality: Right;   EUS N/A 10/02/2014   Procedure: LOWER ENDOSCOPIC ULTRASOUND (EUS);  Surgeon: Arta Silence, MD;  Location: [facility];  Service: Endoscopy;  Laterality:  N/A;   EXCISION SOFT TISSUE MASS RIGHT FOREMAN  12-08-2006   EYE SURGERY  as child   pytosis of eyelids repair   INCISION AND DRAINAGE OF WOUND Bilateral 12/26/2015   Procedure: DEBRIDEMENT OF BILATERAL MASTECTOMY FLAPS;  Surgeon: Irene Limbo, MD;  Location: [place];  Service: Plastics;  Laterality: Bilateral;   IR CV LINE INJECTION  05/31/2017   IR FLUORO GUIDE CV LINE LEFT  05/31/2017   IR FLUORO GUIDE CV LINE RIGHT  04/06/2017   IR FLUORO GUIDE CV MIDLINE PICC RIGHT  05/30/2017   IR NEPHROSTOGRAM LEFT INITIAL PLACEMENT  09/02/2017   IR NEPHROSTOGRAM LEFT THRU EXISTING ACCESS  11/29/2017   IR NEPHROSTOGRAM RIGHT INITIAL PLACEMENT  09/02/2017   IR NEPHROSTOGRAM RIGHT THRU EXISTING ACCESS  09/13/2017   IR NEPHROSTOGRAM RIGHT THRU EXISTING ACCESS  11/29/2017   IR NEPHROSTOMY EXCHANGE LEFT  11/28/2017   IR NEPHROSTOMY EXCHANGE LEFT  01/05/2018   IR NEPHROSTOMY EXCHANGE LEFT  02/16/2018   IR NEPHROSTOMY EXCHANGE LEFT  03/30/2018   IR NEPHROSTOMY EXCHANGE LEFT  05/12/2018   IR NEPHROSTOMY EXCHANGE LEFT  06/21/2018   IR NEPHROSTOMY EXCHANGE LEFT  08/04/2018   IR NEPHROSTOMY EXCHANGE LEFT  09/18/2018   IR NEPHROSTOMY EXCHANGE LEFT  10/09/2018   IR NEPHROSTOMY EXCHANGE LEFT  10/27/2018   IR NEPHROSTOMY EXCHANGE LEFT  11/21/2018   IR NEPHROSTOMY EXCHANGE LEFT  01/05/2019   IR NEPHROSTOMY EXCHANGE LEFT  02/15/2019   IR NEPHROSTOMY EXCHANGE RIGHT  10/02/2017   IR NEPHROSTOMY EXCHANGE RIGHT  11/28/2017   IR NEPHROSTOMY EXCHANGE RIGHT  01/05/2018   IR NEPHROSTOMY EXCHANGE RIGHT  02/16/2018   IR NEPHROSTOMY EXCHANGE RIGHT  03/30/2018   IR NEPHROSTOMY EXCHANGE RIGHT  05/12/2018   IR NEPHROSTOMY EXCHANGE RIGHT  06/21/2018   IR NEPHROSTOMY EXCHANGE RIGHT  08/04/2018   IR NEPHROSTOMY EXCHANGE RIGHT  09/18/2018   IR NEPHROSTOMY EXCHANGE RIGHT  10/27/2018   IR NEPHROSTOMY EXCHANGE RIGHT  11/21/2018   IR NEPHROSTOMY EXCHANGE RIGHT  01/05/2019   IR NEPHROSTOMY EXCHANGE RIGHT   02/15/2019   IR NEPHROSTOMY PLACEMENT LEFT  10/02/2017   IR RADIOLOGIST EVAL & MGMT  05/03/2017   IR US GUIDE VASC ACCESS LEFT  05/31/2017   IR US GUIDE VASC ACCESS RIGHT  04/06/2017   IR US GUIDE VASC ACCESS RIGHT  05/30/2017   LAPAROSCOPIC CHOLECYSTECTOMY  1990   LIPOSUCTION WITH LIPOFILLING Bilateral 04/16/2016   Procedure: LIPOSUCTION WITH LIPOFILLING TO BILATERAL CHEST;  Surgeon: Irene Limbo, MD;  Location: [place];  Service: Plastics;  Laterality: Bilateral;   MASTECTOMY W/ SENTINEL NODE BIOPSY Bilateral 12/11/2015   Procedure: RIGHT PROPHYLACTIC MASTECTOMY, LEFT TOTAL MASTECTOMY WITH LEFT AXILLARY SENTINEL LYMPH NODE BIOPSY;  Surgeon: Stark Klein, MD;  Location: [place];  Service: General;  Laterality: Bilateral;   OSTOMY N/A 11/19/2014   Procedure: OSTOMY;  Surgeon: Michael [name], MD;  [facility];  Service: General;  Laterality: N/A;   PROCTOSCOPY N/A 04/01/2017   Procedure: RIDGE PROCTOSCOPY;  Surgeon: Michael [name], MD;  [facility];  Service: General;  Laterality: N/A;   REMOVAL OF BILATERAL TISSUE EXPANDERS WITH PLACEMENT OF BILATERAL BREAST IMPLANTS Bilateral 04/16/2016   Procedure: REMOVAL OF BILATERAL TISSUE EXPANDERS WITH PLACEMENT OF BILATERAL BREAST IMPLANTS;  Surgeon: Irene Limbo, MD;  Location: [place];  Service: Plastics;  Laterality: Bilateral;   ROBOTIC ASSISTED LAP VAGINAL HYSTERECTOMY N/A 11/19/2014   Procedure: ROBOTIC LYSIS OF ADHESIONS, CONVERTED TO LAPAROTOMY RADICAL UPPER VAGINECTOMY,LOW ANTERIOR BOWEL RESECTION, COLOSTOMY, BILATERAL URETERAL STENT PLACEMENT AND CYSTONOMY CLOSURE;  Surgeon: Everitt Amber, MD;  [facility];  Service: Gynecology;  Laterality: N/A;   TISSUE EXPANDER FILLING Bilateral 12/26/2015   Procedure: EXPANSION OF BILATERAL CHEST TISSUE EXPANDERS (60 mL- Right; 75 mL- Left);  Surgeon: Irene Limbo, MD;  Location: [place];  Service: Plastics;  Laterality: Bilateral;    TONSILLECTOMY     TOTAL ABDOMINAL HYSTERECTOMY  March 2006   [facility]   and Bilateral Salpingoophorectomy/  staging for Ovarian cancer/  an   XI ROBOTIC ASSISTED LOWER ANTERIOR RESECTION N/A 04/01/2017   Procedure: XI ROBOTIC VS LAPAROSCOPIC COLOSTOMY TAKEDOWN WITH LYSIS OF ADHESIONS.;  Surgeon: Michael [name], MD;  [facility];  Service: General;  Laterality: N/A;  ERAS PATHWAY    Allergies: Penicillins, Cefaclor, Erythromycin, Tape, Trimethoprim, Ultram [tramadol], Cephalosporins, Fluconazole, Oxycodone, Pectin, Septra [sulfamethoxazole-trimethoprim], and Sulfa antibiotics  Medications: Prior to Admission medications   Medication Sig Start Date End Date Taking? Authorizing Provider  acetaminophen (TYLENOL) 325 MG tablet Take 2 tablets (650 mg total) by mouth every 6 (six) hours as needed. 09/24/17  Yes Emokpae, Courage, MD  anastrozole (ARIMIDEX) 1 MG tablet Take 1 tablet (1 mg total) by mouth daily. 01/25/19  Yes Gorsuch, Ni, MD  Biotin 5 MG TABS Take 5 mg by mouth daily.    Yes [provider]  Cholecalciferol (VITAMIN D3) 10000 UNITS capsule Take 10,000 Units by mouth every Sunday.    Yes [provider]  fentaNYL (DURAGESIC) 50 MCG/HR Place 1 patch onto the skin every 3 (three) days. 03/29/19  Yes [name] R, DO  ferrous sulfate 325 (65 FE) MG tablet Take 1 tablet (325 mg total) by mouth at bedtime. 09/24/17  Yes Emokpae, Courage, MD  filgrastim-sndz (ZARXIO) 480 MCG/0.8ML SOSY injection Inject 0.8 mLs (480 mcg total) into the skin as directed. Inject 480 mcg every 5 days 12/04/18 12/04/19 Yes Gorsuch, Ni, MD  HYDROcodone-acetaminophen (NORCO/VICODIN) 5-325 MG tablet Take 2 tablets by mouth every 6 (six) hours as needed for moderate pain. 03/29/19  Yes Lowne Chase, Yvonne R, DO  JANUVIA 50 MG tablet Take 50 mg by mouth daily.  10/28/17  Yes [provider]  levothyroxine (SYNTHROID, LEVOTHROID) 150 MCG tablet Take 1 tablet (150 mcg total) by mouth daily  before breakfast. 09/25/17  Yes Emokpae, Courage, MD  loratadine (CLARITIN) 10 MG tablet Take 10 mg by mouth daily.    Yes [provider]  nitrofurantoin, macrocrystal-monohydrate, (MACROBID) 100 MG capsule Take 100 mg by mouth at bedtime.   Yes [provider]  omega-3 acid ethyl esters (LOVAZA) 1 G capsule Take 1 g by mouth 2 (two) times daily.    Yes [provider]  Prenatal Vit-Fe Fumarate-FA (PRENATAL VITAMIN PO) Take 1 capsule by mouth daily. Takes prenatal because there are no dyes in it   Yes [provider]  rosuvastatin (CRESTOR) 10 MG tablet Take 10 mg by mouth every evening.    Yes [provider]  Saccharomyces boulardii (FLORASTOR PO) Take 1 capsule by mouth daily. 02/16/18  Yes [provider]  clobetasol (OLUX) 0.05 % topical foam Apply topically 2 (two) times daily.    [provider]  diphenhydrAMINE (BENADRYL) 25 mg capsule Take 1 capsule (25 mg total) by mouth every 8 (eight) hours as needed for itching, allergies or sleep. 09/24/17   Roxan Hockey, MD  Polyethyl Glycol-Propyl Glycol (SYSTANE OP) Place 1 drop into both eyes daily as needed (dry eyes).     [provider]     Family History  Problem Relation Age of Onset   Cancer Mother 50       stomach ca   Hypertension Mother    Cancer Father 45       prostate ca   Diabetes Father    Heart disease Father        CABG   Hypertension Father    Hyperlipidemia Father    Obesity Father    Breast cancer Maternal Aunt        dx in her 44s   Lymphoma Paternal 31    Brain cancer Paternal Grandfather    Ovarian cancer Other    Diabetes Sister    Hypertension Brother y-10   Heart disease Brother        CABG   Diabetes Brother     Social History   Socioeconomic History   Marital status: Married    Spouse name: Armed forces technical officer   Number of children: 1   Years of education: Not on file   Highest education level: Not on file    Occupational History   Occupation: retired Therapist, 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, sports - retired  Scientist, product/process development strain: Not on file   Food insecurity    Worry: Not on file    Inability: Not on Lexicographer needs    Medical: Not on file    Non-medical: Not on file  Tobacco Use   Smoking status: Never Smoker   Smokeless tobacco: Never Used  Substance and Sexual Activity   Alcohol use: Not Currently   Drug use: No   Sexual activity: Not on file  Lifestyle   Physical activity    Days per week: Not on file    Minutes per session: Not on file   Stress: Not on file  Relationships   Social connections    Talks on phone: Not on file    Gets together: Not on file    Attends religious service: Not on file    Active member of club or organization: Not on file    Attends meetings of clubs or organizations: Not on file    Relationship status: Not on file  Other Topics Concern   Not on file  Social History Narrative   Exercise-- has not gotten back into it since cancer came back     Review of Systems: A 12 point ROS discussed and pertinent positives are indicated in the HPI above.  All other systems are negative.  Review of Systems  Constitutional: Negative for chills and fever.  Respiratory: Negative for shortness of breath and wheezing.   Cardiovascular: Negative for chest pain and palpitations.  Gastrointestinal: Negative for abdominal pain, nausea and vomiting.  Genitourinary: Positive for flank pain.  Neurological: Negative for headaches.  Psychiatric/Behavioral: Negative for behavioral problems and confusion.    Vital Signs: BP 133/78 (BP Location: Left Arm)    Pulse 75    Temp 98.2 F (36.8 C) (Oral)    Resp 18  Wt 190 lb (86.2 kg)    SpO2 97%    BMI 34.20 kg/m   Physical Exam Vitals signs and nursing note reviewed.  Constitutional:      General: She is not in acute distress.    Appearance: Normal appearance.  Cardiovascular:      Rate and Rhythm: Normal rate and regular rhythm.     Heart sounds: Normal heart sounds. No murmur.  Pulmonary:     Effort: Pulmonary effort is normal. No respiratory distress.     Breath sounds: Normal breath sounds. No wheezing.  Abdominal:     Tenderness: There is no right CVA tenderness or left CVA tenderness.     Comments: Bilateral nephrostomy tube sites with mild tenderness, no erythema, drainage, or active bleeding; clear yellow urine in both gravity bags.  Skin:    General: Skin is warm and dry.  Neurological:     Mental Status: She is alert.  Psychiatric:        Mood and Affect: Mood normal.        Behavior: Behavior normal.        Thought Content: Thought content normal.        Judgment: Judgment normal.      MD Evaluation Airway: WNL Heart: WNL Abdomen: WNL Chest/ Lungs: WNL ASA  Classification: 3 Mallampati/Airway Score: One   Imaging: US Breast Ltd Uni Right Inc Axilla  Result Date: 03/12/2019 CLINICAL DATA:  63 year old female with history of left breast mastectomy for cancer and prophylactic right breast mastectomy in 2017. This was followed by tissue expander placement and subsequently placement of bilateral breast implants along with lipofilling. The patient noticed 2 palpable areas in the right breast following her reconstruction. She states that these have fluctuated in their prominent over the past 3 years, but have not definitely increased in size. EXAM: ULTRASOUND OF THE RIGHT BREAST COMPARISON:  None. FINDINGS: On physical exam, there are 2 small adjacent firm palpable masses in the lower-inner right breast. Targeted ultrasound is performed of the right breast, showing two adjacent hypoechoic masses, one at 4 o'clock, 2 cm from the nipple and the other at 4 o'clock 1 cm from the nipple. The mass at 4 o'clock, 2 cm from the nipple measures 1.5 x 0.3 x 0.6 cm, and the other at 1 cm from the nipple measures 1.4 x 0.3 x 0.6 cm. No suspicious lymph nodes are  identified in the right axilla. IMPRESSION: 1. There are 2 adjacent probably benign masses in the right breast, favored to represent fat necrosis. 2.  No evidence of right axillary lymphadenopathy. RECOMMENDATION: The patient has an upcoming surgery and would like to have these palpable areas addressed prior to surgery. Therefore, we will proceed with ultrasound-guided biopsy. Due to the proximity of the masses and the similarity of their appearance, these can be biopsied together as one site. I have discussed the findings and recommendations with the patient. Results were also provided in writing at the conclusion of the visit. If applicable, a reminder letter will be sent to the patient regarding the next appointment. BI-RADS CATEGORY  3: Probably benign. Electronically Signed   By: Ammie Ferrier M.D.   On: 03/12/2019 13:27   [place] Rt Breast Bx W Loc Dev 1st Lesion Img Bx Spec US Guide  Addendum Date: 03/19/2019   ADDENDUM REPORT: 03/16/2019 12:23 ADDENDUM: Pathology revealed ORGANIZING FAT NECROSIS WITH CALCIFICATIONS of the Right reconstructed breast, 4 o'clock. This was found to be concordant by Dr. Dorise Bullion. Pathology  results were discussed with the patient by telephone. The patient reported doing well after the biopsy with tenderness at the site. Post biopsy instructions and care were reviewed and questions were answered. The patient was encouraged to call [facility] for any additional concerns. The patient was instructed to continue with monthly self breast examinations, of her reconstructed breasts, and to have clinical follow-up as needed. Pathology results reported by Terie Purser, RN on 03/16/2019. Electronically Signed   By: Dorise Bullion III M.D   On: 03/16/2019 12:23   Result Date: 03/19/2019 CLINICAL DATA:  Biopsy right breast mass EXAM: ULTRASOUND GUIDED RIGHT BREAST CORE NEEDLE BIOPSY COMPARISON:  Previous exam(s). FINDINGS: I met with the patient and we  discussed the procedure of ultrasound-guided biopsy, including benefits and alternatives. We discussed the high likelihood of a successful procedure. We discussed the risks of the procedure, including infection, bleeding, tissue injury, clip migration, and inadequate sampling. Informed written consent was given. The usual time-out protocol was performed immediately prior to the procedure. Lesion quadrant: Lower inner quadrant Using sterile technique and 1% Lidocaine as local anesthetic, under direct ultrasound visualization, a 14 gauge spring-loaded device was used to perform biopsy of 2 adjacent right breast masses at 4 o'clock, 1 cm from the nipple using a medial approach. At the conclusion of the procedure a HydroMARK tissue marker clip was deployed into the biopsy cavity. Follow up 2 view mammogram was performed and dictated separately. IMPRESSION: Ultrasound guided biopsy of a right breast mass at 4 o'clock, 1 cm from the nipple. No apparent complications. Electronically Signed: By: Dorise Bullion III M.D On: 03/15/2019 16:35    Labs:  CBC: Recent Labs    01/19/19 0339 01/22/19 0343 02/15/19 1312 03/29/19 1143  WBC 4.6 21.0* 3.8* 3.1*  HGB 9.5* 9.3* 10.6* 11.0*  HCT 31.7* 30.0* 36.0 36.0  PLT 213 193 291 210    COAGS: Recent Labs    01/05/19 1010 02/15/19 1312 03/29/19 1143  INR 0.9 1.1 1.0    BMP: Recent Labs    01/22/19 0343 01/22/19 1629 02/15/19 1312 03/29/19 1143  NA 142 138 137 138  K 2.9* 4.3 4.1 4.1  CL 107 106 99 105  CO2 25 25 26 24   GLUCOSE 81 110* 143* 111*  BUN 13 13 30* 28*  CALCIUM 8.4* 8.9 9.4 9.4  CREATININE 1.27* 1.35* 1.55* 1.56*  GFRNONAA 45* 41* 35* 35*  GFRAA 52* 48* 41* 40*    LIVER FUNCTION TESTS: Recent Labs    05/19/18 1127 09/28/18 1441 10/04/18 1345 11/29/18 01/17/19 0351 01/22/19 0343  BILITOT 0.4 0.4 0.5  --  0.6  --   AST 30 16 28  46* 24  --   ALT 54* 29 55* 70* 53*  --   ALKPHOS 364* 284* 550* 377* 305*  --   PROT 8.5*  8.6* 8.4*  --  7.4  --   ALBUMIN 2.8* 3.2* 3.0*  --  2.9* 2.4*     Assessment and Plan:  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 Earleen Newport (most recently exchanged bilaterally in IR 02/15/2019 by Dr. Pascal Lux). Plan for routine image-guided bilateral percutaneous nephrostomy tube exchanges today with Dr. Kathlene Cote. Patient is NPO. Afebrile. She does not take blood thinners. INR 1.0 today.  Risks and benefits of bilateral percutaneous nephrostomy tube exchanges was discussed with the patient including, but not limited to, infection, bleeding, significant bleeding causing loss  or decrease in renal function or damage to adjacent structures. All of the patient's questions were answered, patient is agreeable to proceed. Consent signed and in chart.   Thank you for this interesting consult.  I greatly enjoyed meeting [facility] and look forward to participating in their care.  A copy of this report was sent to the requesting provider on this date.  Electronically Signed: Earley Abide, PA-C 03/29/2019, 12:51 PM   I spent a total of 40 Minutes in face to face in clinical consultation, greater than 50% of which was counseling/coordinating care for ureteral obstruction s/p bilateral nephrostomy tube placements/bilateral nephrostomy tube exchanges.

## 2019-03-29 NOTE — Procedures (Signed)
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  Venetia Night. Kathlene Cote, M.D Pager:  [phone]

## 2019-03-29 NOTE — Discharge Instructions (Signed)
Moderate Conscious Sedation, Adult, Care After These instructions provide you with information about caring for yourself after your procedure. Your health care provider may also give you more specific instructions. Your treatment has been planned according to current medical practices, but problems sometimes occur. Call your health care provider if you have any problems or questions after your procedure. What can I expect after the procedure? After your procedure, it is common:  To feel sleepy for several hours.  To feel clumsy and have poor balance for several hours.  To have poor judgment for several hours.  To vomit if you eat too soon. Follow these instructions at home: For at least 24 hours after the procedure:   Do not: ? Participate in activities where you could fall or become injured. ? Drive. ? Use heavy machinery. ? Drink alcohol. ? Take sleeping pills or medicines that cause drowsiness. ? Make important decisions or sign legal documents. ? Take care of children on your own.  Rest. Eating and drinking  Follow the diet recommended by your health care provider.  If you vomit: ? Drink water, juice, or soup when you can drink without vomiting. ? Make sure you have little or no nausea before eating solid foods. General instructions  Have a responsible adult stay with you until you are awake and alert.  Take over-the-counter and prescription medicines only as told by your health care provider.  If you smoke, do not smoke without supervision.  Keep all follow-up visits as told by your health care provider. This is important. Contact a health care provider if:  You keep feeling nauseous or you keep vomiting.  You feel light-headed.  You develop a rash.  You have a fever. Get help right away if:  You have trouble breathing. This information is not intended to replace advice given to you by your health care provider. Make sure you discuss any questions you have  with your health care provider. Document Released: 05/09/2013 Document Revised: 07/01/2017 Document Reviewed: 11/08/2015 Elsevier Patient Education  [place].   Percutaneous Nephrostomy, Care After This sheet gives you information about how to care for yourself after your procedure. Your health care provider may also give you more specific instructions. If you have problems or questions, contact your health care provider. What can I expect after the procedure? After the procedure, it is common to have:  Some soreness where the nephrostomy tube was inserted (tube insertion site).  Blood-tinged drainage from the nephrostomy tube for the first 24 hours. Follow these instructions at home: Activity  Return to your normal activities as told by your health care provider. Ask your health care provider what activities are safe for you.  Avoid activities that may cause the nephrostomy tubing to bend.  Do not take baths, swim, or use a hot tub until your health care provider approves. Ask your health care provider if you can take showers. Cover the nephrostomy tube dressing with a watertight covering when you take a shower.  Donot drive for 24 hours if you were given a medicine to help you relax (sedative). Care of the tube insertion site   Follow instructions from your health care provider about how to take care of your tube insertion site. Make sure you: ? Wash your hands with soap and water before you change your bandage (dressing). If soap and water are not available, use hand sanitizer. ? Change your dressing as told by your health care provider. Be careful not to pull on  the tube while removing the dressing. ? When you change the dressing, wash the skin around the tube, rinse well, and pat the skin dry.  Check the tube insertion area every day for signs of infection. Check for: ? More redness, swelling, or pain. ? More fluid or blood. ? Warmth. ? Pus or a bad smell. Care of  the nephrostomy tube and drainage bag  Always keep the tubing, the leg bag, or the bedside drainage bags below the level of the kidney so that your urine drains freely.  When connecting your nephrostomy tube to a drainage bag, make sure that there are no kinks in the tubing and that your urine is draining freely. You may want to use an elastic bandage to wrap any exposed tubing that goes from the nephrostomy tube to any of the connecting tubes.  At night, you may want to connect your nephrostomy tube or the leg bag to a larger bedside drainage bag.  Follow instructions from your health care provider about how to empty or change the drainage bag.  Empty the drainage bag when it becomes ? full.  Replace the drainage bag and any extension tubing that is connected to your nephrostomy tube every 3 weeks or as often as told by your health care provider. Your health care provider will explain how to change the drainage bag and extension tubing. General instructions  Take over-the-counter and prescription medicines only as told by your health care provider.  Keep all follow-up visits as told by your health care provider. This is important. Contact a health care provider if:  You have problems with any of the valves or tubing.  You have persistent pain or soreness in your back.  You have more redness, swelling, or pain around your tube insertion site.  You have more fluid or blood coming from your tube insertion site.  Your tube insertion site feels warm to the touch.  You have pus or a bad smell coming from your tube insertion site.  You have increased urine output or you feel burning when urinating. Get help right away if:  You have pain in your abdomen during the first week.  You have chest pain or have trouble breathing.  You have a new appearance of blood in your urine.  You have a fever or chills.  You have back pain that is not relieved by your medicine.  You have decreased  urine output.  Your nephrostomy tube comes out.  Please flush both PICC line ports with heparin per home protocol upon arrival to home after procedure.  For emergency same day of procedure related to nephorostomy tube placement call IR on call MD at [phone] or visit the Emergency Room.   This information is not intended to replace advice given to you by your health care provider. Make sure you discuss any questions you have with your health care provider. Document Released: 03/11/2004 Document Revised: 07/01/2017 Document Reviewed: 04/30/2016 Elsevier Patient Education  2020 [employer].

## 2019-04-02 DIAGNOSIS — E860 Dehydration: Secondary | ICD-10-CM | POA: Diagnosis not present

## 2019-04-02 DIAGNOSIS — L89309 Pressure ulcer of unspecified buttock, unspecified stage: Secondary | ICD-10-CM | POA: Diagnosis not present

## 2019-04-02 DIAGNOSIS — E785 Hyperlipidemia, unspecified: Secondary | ICD-10-CM | POA: Diagnosis not present

## 2019-04-03 DIAGNOSIS — E860 Dehydration: Secondary | ICD-10-CM | POA: Diagnosis not present

## 2019-04-03 DIAGNOSIS — T82594D Other mechanical complication of infusion catheter, subsequent encounter: Secondary | ICD-10-CM | POA: Diagnosis not present

## 2019-04-03 DIAGNOSIS — Z936 Other artificial openings of urinary tract status: Secondary | ICD-10-CM

## 2019-04-03 DIAGNOSIS — T83592A Infection and inflammatory reaction due to indwelling ureteral stent, initial encounter: Secondary | ICD-10-CM | POA: Diagnosis not present

## 2019-04-03 DIAGNOSIS — L89309 Pressure ulcer of unspecified buttock, unspecified stage: Secondary | ICD-10-CM | POA: Diagnosis not present

## 2019-04-03 DIAGNOSIS — E785 Hyperlipidemia, unspecified: Secondary | ICD-10-CM | POA: Diagnosis not present

## 2019-04-03 HISTORY — DX: Other artificial openings of urinary tract status: Z93.6

## 2019-04-04 ENCOUNTER — Telehealth (INDEPENDENT_AMBULATORY_CARE_PROVIDER_SITE_OTHER): Payer: BC Managed Care – PPO | Admitting: Family Medicine

## 2019-04-04 ENCOUNTER — Other Ambulatory Visit: Payer: Self-pay

## 2019-04-04 ENCOUNTER — Encounter (INDEPENDENT_AMBULATORY_CARE_PROVIDER_SITE_OTHER): Payer: Self-pay | Admitting: Family Medicine

## 2019-04-04 DIAGNOSIS — E669 Obesity, unspecified: Secondary | ICD-10-CM | POA: Diagnosis not present

## 2019-04-04 DIAGNOSIS — E860 Dehydration: Secondary | ICD-10-CM | POA: Diagnosis not present

## 2019-04-04 DIAGNOSIS — N321 Vesicointestinal fistula: Secondary | ICD-10-CM

## 2019-04-04 DIAGNOSIS — Z6834 Body mass index (BMI) 34.0-34.9, adult: Secondary | ICD-10-CM

## 2019-04-04 DIAGNOSIS — L89309 Pressure ulcer of unspecified buttock, unspecified stage: Secondary | ICD-10-CM | POA: Diagnosis not present

## 2019-04-04 DIAGNOSIS — E785 Hyperlipidemia, unspecified: Secondary | ICD-10-CM | POA: Diagnosis not present

## 2019-04-06 DIAGNOSIS — E785 Hyperlipidemia, unspecified: Secondary | ICD-10-CM | POA: Diagnosis not present

## 2019-04-06 DIAGNOSIS — L89309 Pressure ulcer of unspecified buttock, unspecified stage: Secondary | ICD-10-CM | POA: Diagnosis not present

## 2019-04-06 DIAGNOSIS — E860 Dehydration: Secondary | ICD-10-CM | POA: Diagnosis not present

## 2019-04-09 DIAGNOSIS — L89309 Pressure ulcer of unspecified buttock, unspecified stage: Secondary | ICD-10-CM | POA: Diagnosis not present

## 2019-04-09 DIAGNOSIS — E860 Dehydration: Secondary | ICD-10-CM | POA: Diagnosis not present

## 2019-04-09 DIAGNOSIS — E785 Hyperlipidemia, unspecified: Secondary | ICD-10-CM | POA: Diagnosis not present

## 2019-04-10 DIAGNOSIS — Z452 Encounter for adjustment and management of vascular access device: Secondary | ICD-10-CM | POA: Diagnosis not present

## 2019-04-10 DIAGNOSIS — Z8542 Personal history of malignant neoplasm of other parts of uterus: Secondary | ICD-10-CM | POA: Diagnosis not present

## 2019-04-10 DIAGNOSIS — Z9889 Other specified postprocedural states: Secondary | ICD-10-CM | POA: Diagnosis not present

## 2019-04-10 DIAGNOSIS — N189 Chronic kidney disease, unspecified: Secondary | ICD-10-CM | POA: Diagnosis not present

## 2019-04-10 DIAGNOSIS — K219 Gastro-esophageal reflux disease without esophagitis: Secondary | ICD-10-CM | POA: Diagnosis not present

## 2019-04-10 DIAGNOSIS — L987 Excessive and redundant skin and subcutaneous tissue: Secondary | ICD-10-CM | POA: Diagnosis not present

## 2019-04-10 DIAGNOSIS — E1140 Type 2 diabetes mellitus with diabetic neuropathy, unspecified: Secondary | ICD-10-CM | POA: Diagnosis not present

## 2019-04-10 DIAGNOSIS — M19011 Primary osteoarthritis, right shoulder: Secondary | ICD-10-CM | POA: Diagnosis not present

## 2019-04-10 DIAGNOSIS — E872 Acidosis: Secondary | ICD-10-CM | POA: Diagnosis not present

## 2019-04-10 DIAGNOSIS — K660 Peritoneal adhesions (postprocedural) (postinfection): Secondary | ICD-10-CM | POA: Diagnosis not present

## 2019-04-10 DIAGNOSIS — D689 Coagulation defect, unspecified: Secondary | ICD-10-CM | POA: Diagnosis not present

## 2019-04-10 DIAGNOSIS — E039 Hypothyroidism, unspecified: Secondary | ICD-10-CM | POA: Diagnosis not present

## 2019-04-10 DIAGNOSIS — D708 Other neutropenia: Secondary | ICD-10-CM | POA: Diagnosis not present

## 2019-04-10 DIAGNOSIS — Z923 Personal history of irradiation: Secondary | ICD-10-CM | POA: Diagnosis not present

## 2019-04-10 DIAGNOSIS — I959 Hypotension, unspecified: Secondary | ICD-10-CM | POA: Diagnosis not present

## 2019-04-10 DIAGNOSIS — D62 Acute posthemorrhagic anemia: Secondary | ICD-10-CM | POA: Diagnosis not present

## 2019-04-10 DIAGNOSIS — E1122 Type 2 diabetes mellitus with diabetic chronic kidney disease: Secondary | ICD-10-CM | POA: Diagnosis not present

## 2019-04-10 DIAGNOSIS — E860 Dehydration: Secondary | ICD-10-CM | POA: Diagnosis not present

## 2019-04-10 DIAGNOSIS — T83592A Infection and inflammatory reaction due to indwelling ureteral stent, initial encounter: Secondary | ICD-10-CM | POA: Diagnosis not present

## 2019-04-10 DIAGNOSIS — T82594D Other mechanical complication of infusion catheter, subsequent encounter: Secondary | ICD-10-CM | POA: Diagnosis not present

## 2019-04-10 DIAGNOSIS — M6020 Foreign body granuloma of soft tissue, not elsewhere classified, unspecified site: Secondary | ICD-10-CM | POA: Diagnosis not present

## 2019-04-10 DIAGNOSIS — M79621 Pain in right upper arm: Secondary | ICD-10-CM | POA: Diagnosis not present

## 2019-04-10 DIAGNOSIS — N321 Vesicointestinal fistula: Secondary | ICD-10-CM | POA: Diagnosis not present

## 2019-04-10 DIAGNOSIS — K5289 Other specified noninfective gastroenteritis and colitis: Secondary | ICD-10-CM | POA: Diagnosis not present

## 2019-04-10 DIAGNOSIS — B999 Unspecified infectious disease: Secondary | ICD-10-CM | POA: Diagnosis not present

## 2019-04-10 DIAGNOSIS — L89309 Pressure ulcer of unspecified buttock, unspecified stage: Secondary | ICD-10-CM | POA: Diagnosis not present

## 2019-04-10 DIAGNOSIS — N135 Crossing vessel and stricture of ureter without hydronephrosis: Secondary | ICD-10-CM | POA: Diagnosis not present

## 2019-04-10 DIAGNOSIS — R918 Other nonspecific abnormal finding of lung field: Secondary | ICD-10-CM | POA: Diagnosis not present

## 2019-04-10 DIAGNOSIS — C569 Malignant neoplasm of unspecified ovary: Secondary | ICD-10-CM | POA: Diagnosis not present

## 2019-04-10 DIAGNOSIS — R110 Nausea: Secondary | ICD-10-CM | POA: Diagnosis not present

## 2019-04-10 DIAGNOSIS — N2889 Other specified disorders of kidney and ureter: Secondary | ICD-10-CM | POA: Diagnosis not present

## 2019-04-10 DIAGNOSIS — E785 Hyperlipidemia, unspecified: Secondary | ICD-10-CM | POA: Diagnosis not present

## 2019-04-10 DIAGNOSIS — E1165 Type 2 diabetes mellitus with hyperglycemia: Secondary | ICD-10-CM | POA: Diagnosis not present

## 2019-04-10 DIAGNOSIS — G8918 Other acute postprocedural pain: Secondary | ICD-10-CM | POA: Diagnosis not present

## 2019-04-10 DIAGNOSIS — F419 Anxiety disorder, unspecified: Secondary | ICD-10-CM | POA: Diagnosis not present

## 2019-04-10 NOTE — Progress Notes (Signed)
Office: [phone]  /  Fax: [phone] TeleHealth Visit:  [name] has verbally consented to this TeleHealth visit today. The patient is located at home, the provider is located at the [employer] [facility] office. The participants in this visit include the listed provider and patient. The visit was conducted today via [email or web address].  HPI:   Chief Complaint: OBESITY [name] is here to discuss her progress with her obesity treatment plan. She is on the keep a food journal with 1300-1400 calories and 75+ grams of protein daily and is following her eating plan approximately 0 % of the time (unknown). She states she is exercising 0 minutes 0 times per week. [name] continues to do well with weight loss and she is having her vesiculorectal repair next week. She is working hard to avoid stress eating, but she has still done a little.  She states her weight has been between 195 and 197 lbs at different offices. We were unable to weigh the patient today for this TeleHealth visit. She is unsure if has lost or gained weight since her last visit. She has lost 18 lbs since starting treatment with [place].  Vesicorectal Fistula [name] is having an extensive surgery at [facility] next week to repair her fistula ([facility] operation) and has a lot of questions about nutrition and post-op, and in general is very nervous. She is struggling to contact her surgeon which is contributing to her anxiety.  ASSESSMENT AND PLAN:  Vesicorectal fistula  Class 1 obesity with serious comorbidity and body mass index (BMI) of 34.0 to 34.9 in adult, unspecified obesity type  PLAN:  Vesicorectal Fistula [name] was educated on nutrition, both before and after to maximize a successful recovery. She was reassured that she was doing the right thing contracting her surgeon, and to be persistent. She was encouraged to contact me if she has other concerns and she agreed to do so.  I spent > than 50% of the 25 minute visit on  counseling as documented in the note.  Obesity [name] is currently in the action stage of change. As such, her goal is to continue with weight loss efforts She has agreed to keep a food journal with 1300 calories and 75 grams of protein daily [name] has been instructed to work up to a goal of 150 minutes of combined cardio and strengthening exercise per week for weight loss and overall health benefits. We discussed the following Behavioral Modification Strategies today: emotional eating strategies and ways to avoid boredom eating [name] was congratulated on doing well and was encouraged to continue to be mindful about emotional eating.  [name] has agreed to follow up with our clinic in 5 weeks. She was informed of the importance of frequent follow up visits to maximize her success with intensive lifestyle modifications for her multiple health conditions.  ALLERGIES: Allergies  Allergen Reactions  . Penicillins Swelling    Facial swelling/childhood allergy Has patient had a PCN reaction causing immediate rash, facial/tongue/throat swelling, SOB or lightheadedness with hypotension: Yes Has patient had a PCN reaction causing severe rash involving mucus membranes or skin necrosis: Yes Has patient had a PCN reaction that required hospitalization yes Has patient had a PCN reaction occurring within the last 10 years: No If all of the above answers are "NO", then may proceed with Cephalosporin use.   . Cefaclor Rash    Ceclor  . Erythromycin Other (See Comments)    Gastritis, abd cramps  . Tape Rash    blisters  .  Trimethoprim Rash  . Ultram [Tramadol] Hives  . Cephalosporins Rash  . Fluconazole Rash  . Oxycodone Other (See Comments)    " I just feel weird"  . Pectin Rash    Pectin ring for stoma  . Septra [Sulfamethoxazole-Trimethoprim] Rash  . Sulfa Antibiotics Rash    MEDICATIONS: Current Outpatient Medications on File Prior to Visit  Medication Sig Dispense Refill  . acetaminophen  (TYLENOL) 325 MG tablet Take 2 tablets (650 mg total) by mouth every 6 (six) hours as needed. 30 tablet 1  . anastrozole (ARIMIDEX) 1 MG tablet Take 1 tablet (1 mg total) by mouth daily. 90 tablet 3  . Biotin 5 MG TABS Take 5 mg by mouth daily.     . Cholecalciferol (VITAMIN D3) 10000 UNITS capsule Take 10,000 Units by mouth every Sunday.     . clobetasol (OLUX) 0.05 % topical foam Apply topically 2 (two) times daily.    . diphenhydrAMINE (BENADRYL) 25 mg capsule Take 1 capsule (25 mg total) by mouth every 8 (eight) hours as needed for itching, allergies or sleep. 30 capsule 0  . fentaNYL (DURAGESIC) 50 MCG/HR Place 1 patch onto the skin every 3 (three) days. 10 patch 0  . ferrous sulfate 325 (65 FE) MG tablet Take 1 tablet (325 mg total) by mouth at bedtime. 30 tablet 3  . filgrastim-sndz (ZARXIO) 480 MCG/0.8ML SOSY injection Inject 0.8 mLs (480 mcg total) into the skin as directed. Inject 480 mcg every 5 days 0.8 mL 99  . HYDROcodone-acetaminophen (NORCO/VICODIN) 5-325 MG tablet Take 2 tablets by mouth every 6 (six) hours as needed for moderate pain. 30 tablet 0  . JANUVIA 50 MG tablet Take 50 mg by mouth daily.     [name] Kitchen levothyroxine (SYNTHROID, LEVOTHROID) 150 MCG tablet Take 1 tablet (150 mcg total) by mouth daily before breakfast. 30 tablet 1  . loratadine (CLARITIN) 10 MG tablet Take 10 mg by mouth daily.     . nitrofurantoin, macrocrystal-monohydrate, (MACROBID) 100 MG capsule Take 100 mg by mouth at bedtime.    [name] Kitchen omega-3 acid ethyl esters (LOVAZA) 1 G capsule Take 1 g by mouth 2 (two) times daily.     Vladimir Faster Glycol-Propyl Glycol (SYSTANE OP) Place 1 drop into both eyes daily as needed (dry eyes).     . Prenatal Vit-Fe Fumarate-FA (PRENATAL VITAMIN PO) Take 1 capsule by mouth daily. Takes prenatal because there are no dyes in it    . rosuvastatin (CRESTOR) 10 MG tablet Take 10 mg by mouth every evening.     . Saccharomyces boulardii (FLORASTOR PO) Take 1 capsule by mouth daily.     No  current facility-administered medications on file prior to visit.     PAST MEDICAL HISTORY: Past Medical History:  Diagnosis Date  . Adrenal adenoma, left 02/08/2016   CT: stable benign  . Anemia in neoplastic disease   . Back pain   . Benign essential HTN   . 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 Cancer of corpus uteri, except isthmus [facility])  oncologist-- dr Denman [name] and dr Alvy Bimler    10-15-2004  dx endometroid endometrial and ovarian cancer s/p  chemotheapy and surgery(TAH w/ BSO) :  recurrent 11-19-2014 post pelvic surgery and radiation 01-29-2015 to 03-10-2015  . Chronic idiopathic neutropenia (HCC)    presumed related  to chemotherapy March 2006--- followed by dr Alvy Bimler (treatment w/ G-CSF injections  . Chronic nausea   . Chronic pain    perineal/ anal  area from bladder pad irritates skin , right flank pain  . CKD stage G2/A3, GFR 60-89 and albumin creatinine ratio >300 mg/g    nephrologist-  dr Madelon Lips  . Colovesical fistula   . Diabetic retinopathy, background ([place])   . Difficult intravenous access    small veins--- hx PICC lines  . DM type 2 (diabetes mellitus, type 2) ([place])    monitored by dr Legrand Como altheimer  . Dysuria   . Environmental and seasonal allergies   . Fatty liver 02/08/2016   CT  . Generalized muscle weakness   . GERD (gastroesophageal reflux disease)   . Hiatal hernia   . History of abdominal abscess 04/16/2017   post surgery 04-01-2017  --- resolved 10/ 2018  . History of gastric polyp    2014  duodenum  . History of ileus 04/16/2017   resolved w/ no surgical intervention  . History of radiation therapy    01-29-2015 to 03-10-2015  pelvis 50.4Gy  . Hypothyroidism    monitored by dr Legrand Como altheimer  . IBS (irritable bowel syndrome)   . Ileostomy in  place [facility]) 04/01/2017   created at same time colostomy takedown.  . Joint pain   . Leg edema   . Lower urinary tract symptoms (LUTS)    urge urinary  incontinence  . Mixed dyslipidemia   . Multiple thyroid nodules    Managed by Dr. Harlow Asa  . Nephrostomy status ([place])   . Palpitations   . Pelvic abscess in female 04/16/2017  . PONV (postoperative nausea and vomiting)    "scopolamine patch works for me"  . Radiation-induced dermatitis    contact dermatitis , radiation completed, rash only on ankles now.  . SBO (small bowel obstruction) ([place]) 01/2019  . Seasonal allergies   . Ureteral stricture, right UROLOGIT-  DR [facility]   CHRONIC--  TREATMENT URETERAL STENT  . 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 Vitamin D deficiency   . Wears glasses     PAST SURGICAL HISTORY: Past Surgical History:  Procedure Laterality Date  . APPENDECTOMY    . biopsy thyroid nodules    . BREAST RECONSTRUCTION WITH PLACEMENT OF TISSUE EXPANDER AND FLEX HD (ACELLULAR HYDRATED DERMIS) Bilateral 12/11/2015   Procedure: BILATERAL BREAST RECONSTRUCTION WITH PLACEMENT OF TISSUE EXPANDERS;  Surgeon: Irene Limbo, MD;  Location: [place];  Service: Plastics;  Laterality: Bilateral;  . COLONOSCOPY WITH PROPOFOL N/A 08/21/2013   Procedure: COLONOSCOPY WITH PROPOFOL;  Surgeon: Cleotis Nipper, MD;  Location: [facility];  Service: Endoscopy;  Laterality: N/A;  . COLOSTOMY TAKEDOWN N/A 12/04/2014   Procedure: LAPROSCOPIC LYSIS OF ADHESIONS, SPLENIC MOBILIZATION, RELOCATION OF COLOSTOMY, DEBRIDEMENT INITIAL COLOSTOMY SITE;  Surgeon: Michael [name], MD;  Location: [facility];  Service: General;  Laterality: N/A;  . CYSTOGRAM N/A 06/01/2017   Procedure: CYSTOGRAM;  Surgeon: Alexis Frock, MD;  [facility];  Service: Urology;  Laterality: N/A;  . CYSTOSCOPY W/ RETROGRADES Right 11/21/2015   Procedure: CYSTOSCOPY WITH RETROGRADE PYELOGRAM;  Surgeon: Alexis Frock, MD;  [facility];  Service: Urology;  Laterality:  Right;  . CYSTOSCOPY W/ URETERAL STENT PLACEMENT Right 11/21/2015   Procedure: CYSTOSCOPY WITH STENT REPLACEMENT;  Surgeon: Alexis Frock, MD;  [facility];  Service: Urology;  Laterality: Right;  . CYSTOSCOPY W/ URETERAL STENT PLACEMENT Right 03/10/2016   Procedure: CYSTOSCOPY WITH STENT  REPLACEMENT;  Surgeon: Alexis Frock, MD;  Location: [facility];  Service: Urology;  Laterality: Right;  . CYSTOSCOPY W/ URETERAL STENT PLACEMENT Right 06/30/2016   Procedure: CYSTOSCOPY WITH RETROGRADE PYELOGRAM/URETERAL STENT EXCHANGE;  Surgeon: Alexis Frock, MD;  Location: [facility];  Service: Urology;  Laterality: Right;  . CYSTOSCOPY W/ URETERAL STENT PLACEMENT N/A 06/01/2017   Procedure: CYSTOSCOPY WITH EXAM UNDER ANESTHESIA;  Surgeon: Alexis Frock, MD;  [facility];  Service: Urology;  Laterality: N/A;  . CYSTOSCOPY W/ URETERAL STENT PLACEMENT Right 08/17/2017   Procedure: CYSTOSCOPY WITH RETROGRADE PYELOGRAM/URETERAL STENT REMOVAL;  Surgeon: Alexis Frock, MD;  Location[facility];  Service: Urology;  Laterality: Right;  . CYSTOSCOPY WITH RETROGRADE PYELOGRAM, URETEROSCOPY AND STENT PLACEMENT Right 03/20/2015   Procedure: CYSTOSCOPY WITH RETROGRADE PYELOGRAM, URETEROSCOPY WITH BALLOON DILATION AND STENT PLACEMENT ON RIGHT;  Surgeon: Alexis Frock, MD;  [facility];  Service: Urology;  Laterality: Right;  . CYSTOSCOPY WITH RETROGRADE PYELOGRAM, URETEROSCOPY AND STENT PLACEMENT Right 05/02/2015   Procedure: CYSTOSCOPY WITH RIGHT RETROGRADE PYELOGRAM,  DIAGNOSTIC URETEROSCOPY AND STENT PULL ;  Surgeon: Alexis Frock, MD;  Location: [facility];  Service: Urology;  Laterality: Right;  . CYSTOSCOPY WITH RETROGRADE PYELOGRAM, URETEROSCOPY AND STENT PLACEMENT Right 09/05/2015   Procedure: CYSTOSCOPY WITH RETROGRADE PYELOGRAM,  AND STENT PLACEMENT;  Surgeon: Alexis Frock, MD;  [facility];  Service: Urology;   Laterality: Right;  . CYSTOSCOPY WITH RETROGRADE PYELOGRAM, URETEROSCOPY AND STENT PLACEMENT Right 04/01/2017   Procedure: CYSTOSCOPY WITH RETROGRADE PYELOGRAM, URETEROSCOPY AND STENT PLACEMENT;  Surgeon: Alexis Frock, MD;  [facility];  Service: Urology;  Laterality: Right;  . CYSTOSCOPY WITH STENT PLACEMENT Right 10/27/2016   Procedure: CYSTOSCOPY WITH STENT CHANGE and right retrograde pyelogram;  Surgeon: Alexis Frock, MD;  Location: [facility];  Service: Urology;  Laterality: Right;  . EUS N/A 10/02/2014   Procedure: LOWER ENDOSCOPIC ULTRASOUND (EUS);  Surgeon: Arta Silence, MD;  Location[facility];  Service: Endoscopy;  Laterality: N/A;  . EXCISION SOFT TISSUE MASS RIGHT FOREMAN  12-08-2006  . EYE SURGERY  as child   pytosis of eyelids repair  . INCISION AND DRAINAGE OF WOUND Bilateral 12/26/2015   Procedure: DEBRIDEMENT OF BILATERAL MASTECTOMY FLAPS;  Surgeon: Irene Limbo, MD;  Location: [place];  Service: Plastics;  Laterality: Bilateral;  . IR CV LINE INJECTION  05/31/2017  . IR FLUORO GUIDE CV LINE LEFT  05/31/2017  . IR FLUORO GUIDE CV LINE RIGHT  04/06/2017  . IR FLUORO GUIDE CV MIDLINE PICC RIGHT  05/30/2017  . IR NEPHROSTOGRAM LEFT INITIAL PLACEMENT  09/02/2017  . IR NEPHROSTOGRAM LEFT THRU EXISTING ACCESS  11/29/2017  . IR NEPHROSTOGRAM RIGHT INITIAL PLACEMENT  09/02/2017  . IR NEPHROSTOGRAM RIGHT THRU EXISTING ACCESS  09/13/2017  . IR NEPHROSTOGRAM RIGHT THRU EXISTING ACCESS  11/29/2017  . IR NEPHROSTOMY EXCHANGE LEFT  11/28/2017  . IR NEPHROSTOMY EXCHANGE LEFT  01/05/2018  . IR NEPHROSTOMY EXCHANGE LEFT  02/16/2018  . IR NEPHROSTOMY EXCHANGE LEFT  03/30/2018  . IR NEPHROSTOMY EXCHANGE LEFT  05/12/2018  . IR NEPHROSTOMY EXCHANGE LEFT  06/21/2018  . IR NEPHROSTOMY EXCHANGE LEFT  08/04/2018  . IR NEPHROSTOMY EXCHANGE LEFT  09/18/2018  . IR NEPHROSTOMY EXCHANGE LEFT  10/09/2018  . IR NEPHROSTOMY EXCHANGE LEFT  10/27/2018  . IR NEPHROSTOMY  EXCHANGE LEFT  11/21/2018  . IR NEPHROSTOMY EXCHANGE LEFT  01/05/2019  . IR NEPHROSTOMY EXCHANGE LEFT  02/15/2019  .  IR NEPHROSTOMY EXCHANGE LEFT  03/29/2019  . IR NEPHROSTOMY EXCHANGE RIGHT  10/02/2017  . IR NEPHROSTOMY EXCHANGE RIGHT  11/28/2017  . IR NEPHROSTOMY EXCHANGE RIGHT  01/05/2018  . IR NEPHROSTOMY EXCHANGE RIGHT  02/16/2018  . IR NEPHROSTOMY EXCHANGE RIGHT  03/30/2018  . IR NEPHROSTOMY EXCHANGE RIGHT  05/12/2018  . IR NEPHROSTOMY EXCHANGE RIGHT  06/21/2018  . IR NEPHROSTOMY EXCHANGE RIGHT  08/04/2018  . IR NEPHROSTOMY EXCHANGE RIGHT  09/18/2018  . IR NEPHROSTOMY EXCHANGE RIGHT  10/27/2018  . IR NEPHROSTOMY EXCHANGE RIGHT  11/21/2018  . IR NEPHROSTOMY EXCHANGE RIGHT  01/05/2019  . IR NEPHROSTOMY EXCHANGE RIGHT  02/15/2019  . IR NEPHROSTOMY EXCHANGE RIGHT  03/29/2019  . IR NEPHROSTOMY PLACEMENT LEFT  10/02/2017  . IR RADIOLOGIST EVAL & MGMT  05/03/2017  . IR US GUIDE VASC ACCESS LEFT  05/31/2017  . IR US GUIDE VASC ACCESS RIGHT  04/06/2017  . IR US GUIDE VASC ACCESS RIGHT  05/30/2017  . LAPAROSCOPIC CHOLECYSTECTOMY  1990  . LIPOSUCTION WITH LIPOFILLING Bilateral 04/16/2016   Procedure: LIPOSUCTION WITH LIPOFILLING TO BILATERAL CHEST;  Surgeon: Irene Limbo, MD;  Location: [place];  Service: Plastics;  Laterality: Bilateral;  . MASTECTOMY W/ SENTINEL NODE BIOPSY Bilateral 12/11/2015   Procedure: RIGHT PROPHYLACTIC MASTECTOMY, LEFT TOTAL MASTECTOMY WITH LEFT AXILLARY SENTINEL LYMPH NODE BIOPSY;  Surgeon: Stark Klein, MD;  Location: [place];  Service: General;  Laterality: Bilateral;  . OSTOMY N/A 11/19/2014   Procedure: OSTOMY;  Surgeon: Michael [name], MD;  [facility];  Service: General;  Laterality: N/A;  . PROCTOSCOPY N/A 04/01/2017   Procedure: RIDGE PROCTOSCOPY;  Surgeon: Michael [name], MD;  [facility];  Service: General;  Laterality: N/A;  . REMOVAL OF BILATERAL TISSUE EXPANDERS WITH PLACEMENT OF BILATERAL BREAST IMPLANTS Bilateral 04/16/2016   Procedure: REMOVAL OF  BILATERAL TISSUE EXPANDERS WITH PLACEMENT OF BILATERAL BREAST IMPLANTS;  Surgeon: Irene Limbo, MD;  Location: [place];  Service: Plastics;  Laterality: Bilateral;  . ROBOTIC ASSISTED LAP VAGINAL HYSTERECTOMY N/A 11/19/2014   Procedure: ROBOTIC LYSIS OF ADHESIONS, CONVERTED TO LAPAROTOMY RADICAL UPPER VAGINECTOMY,LOW ANTERIOR BOWEL RESECTION, COLOSTOMY, BILATERAL URETERAL STENT PLACEMENT AND CYSTONOMY CLOSURE;  Surgeon: Everitt Amber, MD;  [facility];  Service: Gynecology;  Laterality: N/A;  . TISSUE EXPANDER FILLING Bilateral 12/26/2015   Procedure: EXPANSION OF BILATERAL CHEST TISSUE EXPANDERS (60 mL- Right; 75 mL- Left);  Surgeon: Irene Limbo, MD;  Location: [place];  Service: Plastics;  Laterality: Bilateral;  . TONSILLECTOMY    . TOTAL ABDOMINAL HYSTERECTOMY  March 2006   [facility]   and Bilateral Salpingoophorectomy/  staging for Ovarian cancer/  an  . XI ROBOTIC ASSISTED LOWER ANTERIOR RESECTION N/A 04/01/2017   Procedure: XI ROBOTIC VS LAPAROSCOPIC COLOSTOMY TAKEDOWN WITH LYSIS OF ADHESIONS.;  Surgeon: Michael [name], MD;  [facility];  Service: General;  Laterality: N/A;  ERAS PATHWAY    SOCIAL HISTORY: Social History   Tobacco Use  . Smoking status: Never Smoker  . Smokeless tobacco: Never Used  Substance Use Topics  . Alcohol use: Not Currently  . Drug use: No    FAMILY HISTORY: Family History  Problem Relation Age of Onset  . Cancer Mother 66       stomach ca  . Hypertension Mother   . Cancer Father 18       prostate ca  . Diabetes Father   . Heart disease Father        CABG  . Hypertension Father   .  Hyperlipidemia Father   . Obesity Father   . Breast cancer Maternal Aunt        dx in her 24s  . Lymphoma Paternal Aunt   . Brain cancer Paternal Grandfather   . Ovarian cancer Other   . Diabetes Sister   . Hypertension Brother y-10  . Heart disease Brother        CABG  . Diabetes Brother     ROS: Review of  Systems  Constitutional: Negative for weight loss.  Psychiatric/Behavioral:       + Anxiety    PHYSICAL EXAM: Pt in no acute distress  RECENT LABS AND TESTS: BMET    Component Value Date/Time   NA 138 03/29/2019 1143   NA 142 11/29/2018   NA 141 01/24/2017 1228   K 4.1 03/29/2019 1143   K 4.6 01/24/2017 1228   CL 105 03/29/2019 1143   CO2 24 03/29/2019 1143   CO2 29 01/24/2017 1228   GLUCOSE 111 (H) 03/29/2019 1143   GLUCOSE 84 01/24/2017 1228   BUN 28 (H) 03/29/2019 1143   BUN 22 (A) 11/29/2018   BUN 22.2 01/24/2017 1228   CREATININE 1.56 (H) 03/29/2019 1143   CREATININE 1.98 (H) 03/27/2018 1324   CREATININE 0.8 01/24/2017 1228   CALCIUM 9.4 03/29/2019 1143   CALCIUM 10.2 01/24/2017 1228   GFRNONAA 35 (L) 03/29/2019 1143   GFRNONAA 26 (L) 03/27/2018 1324   GFRNONAA 78 12/16/2014 1530   GFRAA 40 (L) 03/29/2019 1143   GFRAA 30 (L) 03/27/2018 1324   GFRAA >89 12/16/2014 1530   Lab Results  Component Value Date   HGBA1C 7.9 (H) 09/01/2017   HGBA1C 5.3 04/01/2017   HGBA1C 5.8 (H) 09/05/2015   HGBA1C 6.8 (H) 11/21/2014   HGBA1C 6.3 (H) 11/07/2014   No results found for: INSULIN CBC    Component Value Date/Time   WBC 3.1 (L) 03/29/2019 1143   RBC 3.69 (L) 03/29/2019 1143   HGB 11.0 (L) 03/29/2019 1143   HGB 10.2 (L) 03/27/2018 1324   HGB 12.4 07/29/2017 1444   HCT 36.0 03/29/2019 1143   HCT 38.0 07/29/2017 1444   PLT 210 03/29/2019 1143   PLT 268 03/27/2018 1324   PLT 260 07/29/2017 1444   MCV 97.6 03/29/2019 1143   MCV 90.9 07/29/2017 1444   MCH 29.8 03/29/2019 1143   MCHC 30.6 03/29/2019 1143   RDW 15.1 03/29/2019 1143   RDW 15.5 (H) 07/29/2017 1444   LYMPHSABS 0.7 02/15/2019 1312   LYMPHSABS 0.8 (L) 07/29/2017 1444   MONOABS 0.7 02/15/2019 1312   MONOABS 1.0 (H) 07/29/2017 1444   EOSABS 0.2 02/15/2019 1312   EOSABS 0.0 07/29/2017 1444   BASOSABS 0.0 02/15/2019 1312   BASOSABS 0.0 07/29/2017 1444   Iron/TIBC/Ferritin/ %Sat    Component Value  Date/Time   IRON 7 (L) 08/31/2017 0451   IRON 12 (L) 11/29/2014 1251   TIBC 164 (L) 08/31/2017 0451   TIBC 331 11/29/2014 1251   FERRITIN 27 11/29/2014 1251   IRONPCTSAT 4 (L) 08/31/2017 0451   IRONPCTSAT 4 (L) 11/29/2014 1251   Lipid Panel     Component Value Date/Time   CHOL 109 08/28/2015 1617   TRIG 89 04/18/2017 0427   HDL 43.80 08/28/2015 1617   CHOLHDL 2 08/28/2015 1617   VLDL 19.8 08/28/2015 1617   LDLCALC 45 08/28/2015 1617   Hepatic Function Panel     Component Value Date/Time   PROT 7.4 01/17/2019 0351   PROT 6.9 01/24/2017 1228  ALBUMIN 2.4 (L) 01/22/2019 0343   ALBUMIN 3.4 (L) 01/24/2017 1228   AST 24 01/17/2019 0351   AST 14 (L) 03/27/2018 1324   AST 16 01/24/2017 1228   ALT 53 (H) 01/17/2019 0351   ALT 18 03/27/2018 1324   ALT 14 01/24/2017 1228   ALKPHOS 305 (H) 01/17/2019 0351   ALKPHOS 90 01/24/2017 1228   BILITOT 0.6 01/17/2019 0351   BILITOT 0.2 (L) 03/27/2018 1324   BILITOT 0.34 01/24/2017 1228      Component Value Date/Time   TSH 2.408 08/30/2017 0623   TSH 0.63 08/28/2015 1617      I, Trixie Dredge, am acting as transcriptionist for Dennard Nip, MD I have reviewed the above documentation for accuracy and completeness, and I agree with the above. -Dennard Nip, MD

## 2019-04-11 ENCOUNTER — Telehealth: Payer: Self-pay

## 2019-04-11 DIAGNOSIS — L89309 Pressure ulcer of unspecified buttock, unspecified stage: Secondary | ICD-10-CM | POA: Diagnosis not present

## 2019-04-11 DIAGNOSIS — E860 Dehydration: Secondary | ICD-10-CM | POA: Diagnosis not present

## 2019-04-11 DIAGNOSIS — E785 Hyperlipidemia, unspecified: Secondary | ICD-10-CM | POA: Diagnosis not present

## 2019-04-11 NOTE — Telephone Encounter (Signed)
Called regarding fax from Dr. Terance Hart at [facility] to see when surgery is scheduled. She is having surgery tomorrow 9/10. She will take Zarxio today and usually takes every 5 days. Dr. Terance Hart is looking for Dr. Alvy Bimler advice on Zarxio.

## 2019-04-12 ENCOUNTER — Encounter: Payer: Self-pay | Admitting: Hematology and Oncology

## 2019-04-12 HISTORY — PX: CYSTECTOMY W/ URETEROILEAL CONDUIT: SUR361

## 2019-04-18 DIAGNOSIS — G8918 Other acute postprocedural pain: Secondary | ICD-10-CM | POA: Diagnosis not present

## 2019-04-20 MED ORDER — LACTASE PO
1.00 | ORAL | Status: DC
Start: ? — End: 2019-04-20

## 2019-04-20 MED ORDER — EQUATE NICOTINE 4 MG MT GUM
4.00 | CHEWING_GUM | OROMUCOSAL | Status: DC
Start: ? — End: 2019-04-20

## 2019-04-20 MED ORDER — NEXTERONE 150-4.21 MG/100ML-% IV SOLN
50.00 | INTRAVENOUS | Status: DC
Start: 2019-04-20 — End: 2019-04-20

## 2019-04-20 MED ORDER — ASPIRIN-SALICYLAMIDE-CAFFEINE 650-195-32 MG PO PACK
150.00 | PACK | ORAL | Status: DC
Start: 2019-04-21 — End: 2019-04-20

## 2019-04-20 MED ORDER — CVS EAR DROPS OT
40.00 | OTIC | Status: DC
Start: 2019-04-21 — End: 2019-04-20

## 2019-04-20 MED ORDER — RA VITAMIN C 500 MG PO TABS
480.00 | ORAL_TABLET | ORAL | Status: DC
Start: ? — End: 2019-04-20

## 2019-04-20 MED ORDER — CHOLECALCIFEROL 25 MCG (1000 UT) PO TABS
1000.00 | ORAL_TABLET | ORAL | Status: DC
Start: 2019-04-21 — End: 2019-04-20

## 2019-04-20 MED ORDER — GLUCOSAMINE-CHONDROIT-COLLAGEN PO
100.00 | ORAL | Status: DC
Start: 2019-04-20 — End: 2019-04-20

## 2019-04-20 MED ORDER — ECHINACEA/GOLDEN SEAL PO CAPS
10.00 | ORAL_CAPSULE | ORAL | Status: DC
Start: 2019-04-20 — End: 2019-04-20

## 2019-04-20 MED ORDER — TUSSI PRES-B 2-15-200 MG/5ML PO LIQD
0.50 | ORAL | Status: DC
Start: ? — End: 2019-04-20

## 2019-04-20 MED ORDER — INSULIN REGULAR HUMAN 100 UNIT/ML IJ SOLN
0.00 | INTRAMUSCULAR | Status: DC
Start: 2019-04-20 — End: 2019-04-20

## 2019-04-20 MED ORDER — BENICAR 20 MG PO TABS
12.50 | ORAL_TABLET | ORAL | Status: DC
Start: ? — End: 2019-04-20

## 2019-04-20 MED ORDER — VICON FORTE PO CAPS
17.00 | ORAL_CAPSULE | ORAL | Status: DC
Start: 2019-04-20 — End: 2019-04-20

## 2019-04-20 MED ORDER — FP GAS RELIEF 180 MG PO CAPS
1.00 | ORAL_CAPSULE | ORAL | Status: DC
Start: ? — End: 2019-04-20

## 2019-04-20 MED ORDER — GENERIC EXTERNAL MEDICATION
Status: DC
Start: ? — End: 2019-04-20

## 2019-04-20 MED ORDER — LOFIBRA 134 MG PO CAPS
1.00 | ORAL_CAPSULE | ORAL | Status: DC
Start: 2019-04-21 — End: 2019-04-20

## 2019-04-20 MED ORDER — LOVENOX 150 MG/ML ~~LOC~~ SOLN
0.50 | SUBCUTANEOUS | Status: DC
Start: ? — End: 2019-04-20

## 2019-04-20 MED ORDER — PRIMAQUINE PHOSPHATE POWD
1.00 | Status: DC
Start: ? — End: 2019-04-20

## 2019-04-20 MED ORDER — FP ANTI-DIARRHEAL 1 MG/5ML PO LIQD
500.00 | ORAL | Status: DC
Start: 2019-04-20 — End: 2019-04-20

## 2019-04-20 MED ORDER — PRO HERBS ENERGY PO TABS
20.00 | ORAL_TABLET | ORAL | Status: DC
Start: ? — End: 2019-04-20

## 2019-04-20 MED ORDER — G-FEN DM 20-400 MG PO TABS
1.00 | ORAL_TABLET | ORAL | Status: DC
Start: 2019-04-21 — End: 2019-04-20

## 2019-04-20 MED ORDER — METHYLERGONOVINE MALEATE PO
400.00 | ORAL | Status: DC
Start: 2019-04-21 — End: 2019-04-20

## 2019-04-23 DIAGNOSIS — D62 Acute posthemorrhagic anemia: Secondary | ICD-10-CM | POA: Diagnosis not present

## 2019-04-23 DIAGNOSIS — Z48816 Encounter for surgical aftercare following surgery on the genitourinary system: Secondary | ICD-10-CM | POA: Diagnosis not present

## 2019-04-23 DIAGNOSIS — E1122 Type 2 diabetes mellitus with diabetic chronic kidney disease: Secondary | ICD-10-CM | POA: Diagnosis not present

## 2019-04-23 DIAGNOSIS — Z452 Encounter for adjustment and management of vascular access device: Secondary | ICD-10-CM | POA: Diagnosis not present

## 2019-04-23 DIAGNOSIS — N189 Chronic kidney disease, unspecified: Secondary | ICD-10-CM | POA: Diagnosis not present

## 2019-04-23 DIAGNOSIS — N321 Vesicointestinal fistula: Secondary | ICD-10-CM | POA: Diagnosis not present

## 2019-04-23 DIAGNOSIS — K604 Rectal fistula: Secondary | ICD-10-CM | POA: Diagnosis not present

## 2019-04-23 DIAGNOSIS — Z436 Encounter for attention to other artificial openings of urinary tract: Secondary | ICD-10-CM | POA: Diagnosis not present

## 2019-04-23 DIAGNOSIS — Z48815 Encounter for surgical aftercare following surgery on the digestive system: Secondary | ICD-10-CM | POA: Diagnosis not present

## 2019-04-23 DIAGNOSIS — Z433 Encounter for attention to colostomy: Secondary | ICD-10-CM | POA: Diagnosis not present

## 2019-04-23 DIAGNOSIS — K219 Gastro-esophageal reflux disease without esophagitis: Secondary | ICD-10-CM | POA: Diagnosis not present

## 2019-04-23 DIAGNOSIS — E1140 Type 2 diabetes mellitus with diabetic neuropathy, unspecified: Secondary | ICD-10-CM | POA: Diagnosis not present

## 2019-04-23 DIAGNOSIS — E039 Hypothyroidism, unspecified: Secondary | ICD-10-CM | POA: Diagnosis not present

## 2019-04-25 DIAGNOSIS — L89319 Pressure ulcer of right buttock, unspecified stage: Secondary | ICD-10-CM | POA: Diagnosis not present

## 2019-04-25 DIAGNOSIS — Z933 Colostomy status: Secondary | ICD-10-CM | POA: Diagnosis not present

## 2019-04-26 DIAGNOSIS — E1140 Type 2 diabetes mellitus with diabetic neuropathy, unspecified: Secondary | ICD-10-CM | POA: Diagnosis not present

## 2019-04-26 DIAGNOSIS — E039 Hypothyroidism, unspecified: Secondary | ICD-10-CM | POA: Diagnosis not present

## 2019-04-26 DIAGNOSIS — Z48815 Encounter for surgical aftercare following surgery on the digestive system: Secondary | ICD-10-CM | POA: Diagnosis not present

## 2019-04-26 DIAGNOSIS — Z48816 Encounter for surgical aftercare following surgery on the genitourinary system: Secondary | ICD-10-CM | POA: Diagnosis not present

## 2019-04-26 DIAGNOSIS — N189 Chronic kidney disease, unspecified: Secondary | ICD-10-CM | POA: Diagnosis not present

## 2019-04-26 DIAGNOSIS — D62 Acute posthemorrhagic anemia: Secondary | ICD-10-CM | POA: Diagnosis not present

## 2019-04-26 DIAGNOSIS — Z452 Encounter for adjustment and management of vascular access device: Secondary | ICD-10-CM | POA: Diagnosis not present

## 2019-04-26 DIAGNOSIS — K219 Gastro-esophageal reflux disease without esophagitis: Secondary | ICD-10-CM | POA: Diagnosis not present

## 2019-04-26 DIAGNOSIS — Z433 Encounter for attention to colostomy: Secondary | ICD-10-CM | POA: Diagnosis not present

## 2019-04-26 DIAGNOSIS — E1122 Type 2 diabetes mellitus with diabetic chronic kidney disease: Secondary | ICD-10-CM | POA: Diagnosis not present

## 2019-04-26 DIAGNOSIS — Z436 Encounter for attention to other artificial openings of urinary tract: Secondary | ICD-10-CM | POA: Diagnosis not present

## 2019-04-26 DIAGNOSIS — N321 Vesicointestinal fistula: Secondary | ICD-10-CM | POA: Diagnosis not present

## 2019-04-26 DIAGNOSIS — K604 Rectal fistula: Secondary | ICD-10-CM | POA: Diagnosis not present

## 2019-04-27 DIAGNOSIS — E860 Dehydration: Secondary | ICD-10-CM | POA: Diagnosis not present

## 2019-04-27 DIAGNOSIS — L89309 Pressure ulcer of unspecified buttock, unspecified stage: Secondary | ICD-10-CM | POA: Diagnosis not present

## 2019-04-27 DIAGNOSIS — E785 Hyperlipidemia, unspecified: Secondary | ICD-10-CM | POA: Diagnosis not present

## 2019-04-28 DIAGNOSIS — N189 Chronic kidney disease, unspecified: Secondary | ICD-10-CM | POA: Diagnosis not present

## 2019-04-28 DIAGNOSIS — K604 Rectal fistula: Secondary | ICD-10-CM | POA: Diagnosis not present

## 2019-04-28 DIAGNOSIS — E1140 Type 2 diabetes mellitus with diabetic neuropathy, unspecified: Secondary | ICD-10-CM | POA: Diagnosis not present

## 2019-04-28 DIAGNOSIS — Z433 Encounter for attention to colostomy: Secondary | ICD-10-CM | POA: Diagnosis not present

## 2019-04-28 DIAGNOSIS — Z48816 Encounter for surgical aftercare following surgery on the genitourinary system: Secondary | ICD-10-CM | POA: Diagnosis not present

## 2019-04-28 DIAGNOSIS — D62 Acute posthemorrhagic anemia: Secondary | ICD-10-CM | POA: Diagnosis not present

## 2019-04-28 DIAGNOSIS — Z436 Encounter for attention to other artificial openings of urinary tract: Secondary | ICD-10-CM | POA: Diagnosis not present

## 2019-04-28 DIAGNOSIS — Z48815 Encounter for surgical aftercare following surgery on the digestive system: Secondary | ICD-10-CM | POA: Diagnosis not present

## 2019-04-28 DIAGNOSIS — N321 Vesicointestinal fistula: Secondary | ICD-10-CM | POA: Diagnosis not present

## 2019-04-28 DIAGNOSIS — K219 Gastro-esophageal reflux disease without esophagitis: Secondary | ICD-10-CM | POA: Diagnosis not present

## 2019-04-28 DIAGNOSIS — E1122 Type 2 diabetes mellitus with diabetic chronic kidney disease: Secondary | ICD-10-CM | POA: Diagnosis not present

## 2019-04-28 DIAGNOSIS — Z452 Encounter for adjustment and management of vascular access device: Secondary | ICD-10-CM | POA: Diagnosis not present

## 2019-04-28 DIAGNOSIS — E039 Hypothyroidism, unspecified: Secondary | ICD-10-CM | POA: Diagnosis not present

## 2019-04-30 ENCOUNTER — Encounter: Payer: Self-pay | Admitting: Family Medicine

## 2019-04-30 DIAGNOSIS — Z452 Encounter for adjustment and management of vascular access device: Secondary | ICD-10-CM | POA: Diagnosis not present

## 2019-04-30 DIAGNOSIS — Z436 Encounter for attention to other artificial openings of urinary tract: Secondary | ICD-10-CM | POA: Diagnosis not present

## 2019-04-30 DIAGNOSIS — Z48816 Encounter for surgical aftercare following surgery on the genitourinary system: Secondary | ICD-10-CM | POA: Diagnosis not present

## 2019-04-30 DIAGNOSIS — Z48815 Encounter for surgical aftercare following surgery on the digestive system: Secondary | ICD-10-CM | POA: Diagnosis not present

## 2019-04-30 DIAGNOSIS — N321 Vesicointestinal fistula: Secondary | ICD-10-CM | POA: Diagnosis not present

## 2019-04-30 DIAGNOSIS — N189 Chronic kidney disease, unspecified: Secondary | ICD-10-CM | POA: Diagnosis not present

## 2019-04-30 DIAGNOSIS — E039 Hypothyroidism, unspecified: Secondary | ICD-10-CM | POA: Diagnosis not present

## 2019-04-30 DIAGNOSIS — K219 Gastro-esophageal reflux disease without esophagitis: Secondary | ICD-10-CM | POA: Diagnosis not present

## 2019-04-30 DIAGNOSIS — D62 Acute posthemorrhagic anemia: Secondary | ICD-10-CM | POA: Diagnosis not present

## 2019-04-30 DIAGNOSIS — E1122 Type 2 diabetes mellitus with diabetic chronic kidney disease: Secondary | ICD-10-CM | POA: Diagnosis not present

## 2019-04-30 DIAGNOSIS — K604 Rectal fistula: Secondary | ICD-10-CM | POA: Diagnosis not present

## 2019-04-30 DIAGNOSIS — E1140 Type 2 diabetes mellitus with diabetic neuropathy, unspecified: Secondary | ICD-10-CM | POA: Diagnosis not present

## 2019-04-30 DIAGNOSIS — Z433 Encounter for attention to colostomy: Secondary | ICD-10-CM | POA: Diagnosis not present

## 2019-05-02 DIAGNOSIS — N321 Vesicointestinal fistula: Secondary | ICD-10-CM | POA: Diagnosis not present

## 2019-05-03 DIAGNOSIS — E1122 Type 2 diabetes mellitus with diabetic chronic kidney disease: Secondary | ICD-10-CM | POA: Diagnosis not present

## 2019-05-03 DIAGNOSIS — D62 Acute posthemorrhagic anemia: Secondary | ICD-10-CM | POA: Diagnosis not present

## 2019-05-03 DIAGNOSIS — E039 Hypothyroidism, unspecified: Secondary | ICD-10-CM | POA: Diagnosis not present

## 2019-05-03 DIAGNOSIS — K604 Rectal fistula: Secondary | ICD-10-CM | POA: Diagnosis not present

## 2019-05-03 DIAGNOSIS — Z433 Encounter for attention to colostomy: Secondary | ICD-10-CM | POA: Diagnosis not present

## 2019-05-03 DIAGNOSIS — Z48816 Encounter for surgical aftercare following surgery on the genitourinary system: Secondary | ICD-10-CM | POA: Diagnosis not present

## 2019-05-03 DIAGNOSIS — E1140 Type 2 diabetes mellitus with diabetic neuropathy, unspecified: Secondary | ICD-10-CM | POA: Diagnosis not present

## 2019-05-03 DIAGNOSIS — K219 Gastro-esophageal reflux disease without esophagitis: Secondary | ICD-10-CM | POA: Diagnosis not present

## 2019-05-03 DIAGNOSIS — Z436 Encounter for attention to other artificial openings of urinary tract: Secondary | ICD-10-CM | POA: Diagnosis not present

## 2019-05-03 DIAGNOSIS — N189 Chronic kidney disease, unspecified: Secondary | ICD-10-CM | POA: Diagnosis not present

## 2019-05-03 DIAGNOSIS — N321 Vesicointestinal fistula: Secondary | ICD-10-CM | POA: Diagnosis not present

## 2019-05-03 DIAGNOSIS — Z48815 Encounter for surgical aftercare following surgery on the digestive system: Secondary | ICD-10-CM | POA: Diagnosis not present

## 2019-05-03 DIAGNOSIS — Z452 Encounter for adjustment and management of vascular access device: Secondary | ICD-10-CM | POA: Diagnosis not present

## 2019-05-07 DIAGNOSIS — Z48815 Encounter for surgical aftercare following surgery on the digestive system: Secondary | ICD-10-CM | POA: Diagnosis not present

## 2019-05-07 DIAGNOSIS — E1140 Type 2 diabetes mellitus with diabetic neuropathy, unspecified: Secondary | ICD-10-CM | POA: Diagnosis not present

## 2019-05-07 DIAGNOSIS — Z433 Encounter for attention to colostomy: Secondary | ICD-10-CM | POA: Diagnosis not present

## 2019-05-07 DIAGNOSIS — Z48816 Encounter for surgical aftercare following surgery on the genitourinary system: Secondary | ICD-10-CM | POA: Diagnosis not present

## 2019-05-07 DIAGNOSIS — Z452 Encounter for adjustment and management of vascular access device: Secondary | ICD-10-CM | POA: Diagnosis not present

## 2019-05-07 DIAGNOSIS — D62 Acute posthemorrhagic anemia: Secondary | ICD-10-CM | POA: Diagnosis not present

## 2019-05-07 DIAGNOSIS — N189 Chronic kidney disease, unspecified: Secondary | ICD-10-CM | POA: Diagnosis not present

## 2019-05-07 DIAGNOSIS — Z436 Encounter for attention to other artificial openings of urinary tract: Secondary | ICD-10-CM | POA: Diagnosis not present

## 2019-05-07 DIAGNOSIS — E039 Hypothyroidism, unspecified: Secondary | ICD-10-CM | POA: Diagnosis not present

## 2019-05-07 DIAGNOSIS — N321 Vesicointestinal fistula: Secondary | ICD-10-CM | POA: Diagnosis not present

## 2019-05-07 DIAGNOSIS — K604 Rectal fistula: Secondary | ICD-10-CM | POA: Diagnosis not present

## 2019-05-07 DIAGNOSIS — E1122 Type 2 diabetes mellitus with diabetic chronic kidney disease: Secondary | ICD-10-CM | POA: Diagnosis not present

## 2019-05-07 DIAGNOSIS — K219 Gastro-esophageal reflux disease without esophagitis: Secondary | ICD-10-CM | POA: Diagnosis not present

## 2019-05-09 ENCOUNTER — Other Ambulatory Visit: Payer: Self-pay

## 2019-05-09 ENCOUNTER — Telehealth (INDEPENDENT_AMBULATORY_CARE_PROVIDER_SITE_OTHER): Payer: BC Managed Care – PPO | Admitting: Family Medicine

## 2019-05-09 DIAGNOSIS — K219 Gastro-esophageal reflux disease without esophagitis: Secondary | ICD-10-CM | POA: Diagnosis not present

## 2019-05-09 DIAGNOSIS — D62 Acute posthemorrhagic anemia: Secondary | ICD-10-CM | POA: Diagnosis not present

## 2019-05-09 DIAGNOSIS — Z6834 Body mass index (BMI) 34.0-34.9, adult: Secondary | ICD-10-CM

## 2019-05-09 DIAGNOSIS — K604 Rectal fistula: Secondary | ICD-10-CM | POA: Diagnosis not present

## 2019-05-09 DIAGNOSIS — E1122 Type 2 diabetes mellitus with diabetic chronic kidney disease: Secondary | ICD-10-CM

## 2019-05-09 DIAGNOSIS — N184 Chronic kidney disease, stage 4 (severe): Secondary | ICD-10-CM | POA: Diagnosis not present

## 2019-05-09 DIAGNOSIS — Z48815 Encounter for surgical aftercare following surgery on the digestive system: Secondary | ICD-10-CM | POA: Diagnosis not present

## 2019-05-09 DIAGNOSIS — Z436 Encounter for attention to other artificial openings of urinary tract: Secondary | ICD-10-CM | POA: Diagnosis not present

## 2019-05-09 DIAGNOSIS — N189 Chronic kidney disease, unspecified: Secondary | ICD-10-CM | POA: Diagnosis not present

## 2019-05-09 DIAGNOSIS — E1140 Type 2 diabetes mellitus with diabetic neuropathy, unspecified: Secondary | ICD-10-CM | POA: Diagnosis not present

## 2019-05-09 DIAGNOSIS — E669 Obesity, unspecified: Secondary | ICD-10-CM

## 2019-05-09 DIAGNOSIS — Z48816 Encounter for surgical aftercare following surgery on the genitourinary system: Secondary | ICD-10-CM | POA: Diagnosis not present

## 2019-05-09 DIAGNOSIS — N321 Vesicointestinal fistula: Secondary | ICD-10-CM | POA: Diagnosis not present

## 2019-05-09 DIAGNOSIS — Z433 Encounter for attention to colostomy: Secondary | ICD-10-CM | POA: Diagnosis not present

## 2019-05-09 DIAGNOSIS — E039 Hypothyroidism, unspecified: Secondary | ICD-10-CM | POA: Diagnosis not present

## 2019-05-09 DIAGNOSIS — Z452 Encounter for adjustment and management of vascular access device: Secondary | ICD-10-CM | POA: Diagnosis not present

## 2019-05-10 ENCOUNTER — Ambulatory Visit (HOSPITAL_COMMUNITY): Payer: BC Managed Care – PPO

## 2019-05-10 ENCOUNTER — Other Ambulatory Visit (HOSPITAL_COMMUNITY): Payer: BC Managed Care – PPO

## 2019-05-10 DIAGNOSIS — Z933 Colostomy status: Secondary | ICD-10-CM | POA: Diagnosis not present

## 2019-05-10 DIAGNOSIS — Z936 Other artificial openings of urinary tract status: Secondary | ICD-10-CM | POA: Diagnosis not present

## 2019-05-10 DIAGNOSIS — L89319 Pressure ulcer of right buttock, unspecified stage: Secondary | ICD-10-CM | POA: Diagnosis not present

## 2019-05-10 NOTE — Progress Notes (Signed)
Office: [phone]  /  Fax: [phone] TeleHealth Visit:  [name] has verbally consented to this TeleHealth visit today. The patient is located at home, the provider is located at the [employer] [facility] office. The participants in this visit include the listed provider and patient. The visit was conducted today via FaceTime.  HPI:   Chief Complaint: OBESITY [name] is here to discuss her progress with her obesity treatment plan. She is doing portion control better and making smarter food choices, such as increase vegetables and decrease simple carbohydrates and is following her eating plan approximately 0% of the time. She states she is 0 minutes 0 times per week. [name] had surgery 04/12/2019 at [facility] (ileal conduit, removal of pelvic organs, end colostomy, panniculectomy). She has lost weight and weighs 183 lbs today. She is quite exhausted and has good and bad days. She is trying to get protein in the best she can. Sometimes she has nausea and no appetite. She is supplementing with protein shakes. We were unable to weigh the patient today for this TeleHealth visit. She feels as if she has lost weight since her last visit. She has lost 18 lbs since starting treatment with [place].  Diabetes Mellitus with Stage 4 Chronic Kidney Disease [name] has a diagnosis of diabetes mellitus, which is well controlled on Januvia. [name] states fasting blood sugars range between 120's and 130's. Last A1c was 5.9 in Care Everywhere. No hypoglycemia.  ASSESSMENT AND PLAN:  Type 2 diabetes mellitus with stage 4 chronic kidney disease, without long-term current use of insulin (HCC)  Class 2 severe obesity with serious comorbidity and body mass index (BMI) of 36.0 to 36.9 in adult, unspecified obesity type ([place])  PLAN:  Diabetes Mellitus with Stage 4 Chronic Kidney Disease [name] has been given extensive diabetes education by myself today including ideal fasting and post-prandial blood glucose  readings, individual ideal HgA1c goals  and hypoglycemia prevention. We discussed the importance of good blood sugar control to decrease the likelihood of diabetic complications such as nephropathy, neuropathy, limb loss, blindness, coronary artery disease, and death. We discussed the importance of intensive lifestyle modification including diet, exercise and weight loss as the first line treatment for diabetes. [name] agrees to continue Januvia and will follow-up at the agreed upon time.  Obesity [name] is currently in the action stage of change. As such, her goal is to continue with weight loss efforts. She has agreed to portion control better and make smarter food choices, such as increase vegetables and decrease simple carbohydrates. She will continue to supplement with a protein shake once daily and will try to eat as much protein as she can. [name] has been instructed to walk as tolerated for weight loss and overall health benefits. We discussed the following Behavioral Modification Strategies today: increasing lean protein intake and planning for success.  [name] has agreed to follow-up with our clinic in 2 weeks. She was informed of the importance of frequent follow-up visits to maximize her success with intensive lifestyle modifications for her multiple health conditions.  ALLERGIES: Allergies  Allergen Reactions   Penicillins Swelling    Facial swelling/childhood allergy Has patient had a PCN reaction causing immediate rash, facial/tongue/throat swelling, SOB or lightheadedness with hypotension: Yes Has patient had a PCN reaction causing severe rash involving mucus membranes or skin necrosis: Yes Has patient had a PCN reaction that required hospitalization yes Has patient had a PCN reaction occurring within the last 10 years: No If all of the  above answers are "NO", then may proceed with Cephalosporin use.    Cefaclor Rash    Ceclor   Erythromycin Other (See Comments)    Gastritis,  abd cramps   Tape Rash    blisters   Trimethoprim Rash   Ultram [Tramadol] Hives   Cephalosporins Rash   Fluconazole Rash   Oxycodone Other (See Comments)    " I just feel weird"   Pectin Rash    Pectin ring for stoma   Septra [Sulfamethoxazole-Trimethoprim] Rash   Sulfa Antibiotics Rash    MEDICATIONS: Current Outpatient Medications on File Prior to Visit  Medication Sig Dispense Refill   acetaminophen (TYLENOL) 325 MG tablet Take 2 tablets (650 mg total) by mouth every 6 (six) hours as needed. 30 tablet 1   anastrozole (ARIMIDEX) 1 MG tablet Take 1 tablet (1 mg total) by mouth daily. 90 tablet 3   Biotin 5 MG TABS Take 5 mg by mouth daily.      Cholecalciferol (VITAMIN D3) 10000 UNITS capsule Take 10,000 Units by mouth every Sunday.      clobetasol (OLUX) 0.05 % topical foam Apply topically 2 (two) times daily.     diphenhydrAMINE (BENADRYL) 25 mg capsule Take 1 capsule (25 mg total) by mouth every 8 (eight) hours as needed for itching, allergies or sleep. 30 capsule 0   fentaNYL (DURAGESIC) 50 MCG/HR Place 1 patch onto the skin every 3 (three) days. 10 patch 0   ferrous sulfate 325 (65 FE) MG tablet Take 1 tablet (325 mg total) by mouth at bedtime. 30 tablet 3   filgrastim-sndz (ZARXIO) 480 MCG/0.8ML SOSY injection Inject 0.8 mLs (480 mcg total) into the skin as directed. Inject 480 mcg every 5 days 0.8 mL 99   HYDROcodone-acetaminophen (NORCO/VICODIN) 5-325 MG tablet Take 2 tablets by mouth every 6 (six) hours as needed for moderate pain. 30 tablet 0   JANUVIA 50 MG tablet Take 50 mg by mouth daily.      levothyroxine (SYNTHROID, LEVOTHROID) 150 MCG tablet Take 1 tablet (150 mcg total) by mouth daily before breakfast. 30 tablet 1   loratadine (CLARITIN) 10 MG tablet Take 10 mg by mouth daily.      nitrofurantoin, macrocrystal-monohydrate, (MACROBID) 100 MG capsule Take 100 mg by mouth at bedtime.     omega-3 acid ethyl esters (LOVAZA) 1 G capsule Take 1 g  by mouth 2 (two) times daily.      Polyethyl Glycol-Propyl Glycol (SYSTANE OP) Place 1 drop into both eyes daily as needed (dry eyes).      Prenatal Vit-Fe Fumarate-FA (PRENATAL VITAMIN PO) Take 1 capsule by mouth daily. Takes prenatal because there are no dyes in it     rosuvastatin (CRESTOR) 10 MG tablet Take 10 mg by mouth every evening.      Saccharomyces boulardii (FLORASTOR PO) Take 1 capsule by mouth daily.     No current facility-administered medications on file prior to visit.     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+, HER2 negative/  12-11-2015 bilateral mastecotmy w/ reconstruction (no radiation and no chemo)   Cancer of corpus uteri, except isthmus [facility])  oncologist-- dr Denman [name] and dr Alvy Bimler  10-15-2004  dx endometroid endometrial and ovarian cancer s/p  chemotheapy and surgery(TAH w/ BSO) :  recurrent 11-19-2014 post pelvic surgery and radiation 01-29-2015 to 03-10-2015   Chronic idiopathic neutropenia ([place])    presumed related to chemotherapy March 2006--- followed by dr Alvy Bimler (treatment w/ G-CSF injections   Chronic nausea    Chronic pain    perineal/ anal  area from bladder pad irritates skin , right flank pain   CKD stage G2/A3, GFR 60-89 and albumin creatinine ratio >300 mg/g    nephrologist-  dr Madelon Lips   Colovesical fistula    Diabetic retinopathy, background ([place])    Difficult intravenous access    small veins--- hx PICC lines   DM type 2 (diabetes mellitus, type 2) ([place])    monitored by dr Legrand Como altheimer   Dysuria    Environmental and seasonal allergies    Fatty liver 02/08/2016   CT   Generalized muscle weakness    GERD (gastroesophageal  reflux disease)    Hiatal hernia    History of abdominal abscess 04/16/2017   post surgery 04-01-2017  --- resolved 10/ 2018   History of gastric polyp    2014  duodenum   History of ileus 04/16/2017   resolved w/ no surgical intervention   History of radiation therapy    01-29-2015 to 03-10-2015  pelvis 50.4Gy   Hypothyroidism    monitored by dr Legrand Como altheimer   IBS (irritable bowel syndrome)    Ileostomy in place ([place]) 04/01/2017   created at same time colostomy takedown.   Joint pain    Leg edema    Lower urinary tract symptoms (LUTS)    urge urinary  incontinence   Mixed dyslipidemia    Multiple thyroid nodules    Managed by Dr. Harlow Asa   Nephrostomy status [facility])    Palpitations    Pelvic abscess in female 04/16/2017   PONV (postoperative nausea and vomiting)    "scopolamine patch works for me"   Radiation-induced dermatitis    contact dermatitis , radiation completed, rash only on ankles now.   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   Vitamin D deficiency    Wears glasses     PAST SURGICAL HISTORY: Past Surgical History:  Procedure Laterality Date   APPENDECTOMY     biopsy thyroid nodules     BREAST RECONSTRUCTION WITH PLACEMENT OF TISSUE EXPANDER AND FLEX HD (ACELLULAR HYDRATED DERMIS) Bilateral 12/11/2015   Procedure: BILATERAL BREAST RECONSTRUCTION WITH PLACEMENT OF TISSUE EXPANDERS;  Surgeon: Irene Limbo, MD;  Location: [place];  Service: Plastics;  Laterality: Bilateral;   COLONOSCOPY WITH PROPOFOL N/A 08/21/2013   Procedure: COLONOSCOPY WITH PROPOFOL;  Surgeon: Cleotis Nipper, MD;  [facility];  Service: Endoscopy;  Laterality: N/A;   COLOSTOMY TAKEDOWN N/A 12/04/2014   Procedure: LAPROSCOPIC LYSIS OF ADHESIONS, SPLENIC MOBILIZATION, RELOCATION OF COLOSTOMY,  DEBRIDEMENT INITIAL COLOSTOMY SITE;  Surgeon: Michael [name], MD;  [facility];  Service: General;  Laterality: N/A;   CYSTOGRAM N/A 06/01/2017   Procedure: CYSTOGRAM;  Surgeon: Alexis Frock, MD;  [facility];  Service: Urology;  Laterality: N/A;   CYSTOSCOPY W/ RETROGRADES Right 11/21/2015   Procedure: CYSTOSCOPY WITH RETROGRADE PYELOGRAM;  Surgeon: Alexis Frock, MD;  [facility];  Service: Urology;  Laterality: Right;   CYSTOSCOPY W/ URETERAL STENT PLACEMENT Right  11/21/2015   Procedure: CYSTOSCOPY WITH STENT REPLACEMENT;  Surgeon: Alexis Frock, MD;  [facility];  Service: Urology;  Laterality: Right;   CYSTOSCOPY W/ URETERAL STENT PLACEMENT Right 03/10/2016   Procedure: CYSTOSCOPY WITH STENT REPLACEMENT;  Surgeon: Alexis Frock, MD;  [facility];  Service: Urology;  Laterality: Right;   CYSTOSCOPY W/ URETERAL STENT PLACEMENT Right 06/30/2016   Procedure: CYSTOSCOPY WITH RETROGRADE PYELOGRAM/URETERAL STENT EXCHANGE;  Surgeon: Alexis Frock, MD;  Location[facility];  Service: Urology;  Laterality: Right;   CYSTOSCOPY W/ URETERAL STENT PLACEMENT N/A 06/01/2017   Procedure: CYSTOSCOPY WITH EXAM UNDER ANESTHESIA;  Surgeon: Alexis Frock, MD;  [facility];  Service: Urology;  Laterality: N/A;   CYSTOSCOPY W/ URETERAL STENT PLACEMENT Right 08/17/2017   Procedure: CYSTOSCOPY WITH RETROGRADE PYELOGRAM/URETERAL STENT REMOVAL;  Surgeon: Alexis Frock, MD;  Location: [facility];  Service: Urology;  Laterality: Right;   CYSTOSCOPY WITH RETROGRADE PYELOGRAM, URETEROSCOPY AND STENT PLACEMENT Right 03/20/2015   Procedure: CYSTOSCOPY WITH RETROGRADE PYELOGRAM, URETEROSCOPY WITH BALLOON DILATION AND STENT PLACEMENT ON RIGHT;  Surgeon: Alexis Frock, MD;  Location: [facility];  Service: Urology;  Laterality: Right;   CYSTOSCOPY WITH RETROGRADE PYELOGRAM, URETEROSCOPY AND STENT PLACEMENT Right 05/02/2015    Procedure: CYSTOSCOPY WITH RIGHT RETROGRADE PYELOGRAM,  DIAGNOSTIC URETEROSCOPY AND STENT PULL ;  Surgeon: Alexis Frock, MD;  Location: [facility];  Service: Urology;  Laterality: Right;   CYSTOSCOPY WITH RETROGRADE PYELOGRAM, URETEROSCOPY AND STENT PLACEMENT Right 09/05/2015   Procedure: CYSTOSCOPY WITH RETROGRADE PYELOGRAM,  AND STENT PLACEMENT;  Surgeon: Alexis Frock, MD;  [facility];  Service: Urology;  Laterality: Right;   CYSTOSCOPY WITH RETROGRADE PYELOGRAM, URETEROSCOPY AND STENT PLACEMENT Right 04/01/2017   Procedure: CYSTOSCOPY WITH RETROGRADE PYELOGRAM, URETEROSCOPY AND STENT PLACEMENT;  Surgeon: Alexis Frock, MD;  [facility];  Service: Urology;  Laterality: Right;   CYSTOSCOPY WITH STENT PLACEMENT Right 10/27/2016   Procedure: CYSTOSCOPY WITH STENT CHANGE and right retrograde pyelogram;  Surgeon: Alexis Frock, MD;  Location: [facility];  Service: Urology;  Laterality: Right;   EUS N/A 10/02/2014   Procedure: LOWER ENDOSCOPIC ULTRASOUND (EUS);  Surgeon: Arta Silence, MD;  Location: [facility];  Service: Endoscopy;  Laterality: N/A;   EXCISION SOFT TISSUE MASS RIGHT FOREMAN  12-08-2006   EYE SURGERY  as child   pytosis of eyelids repair   INCISION AND DRAINAGE OF WOUND Bilateral 12/26/2015   Procedure: DEBRIDEMENT OF BILATERAL MASTECTOMY FLAPS;  Surgeon: Irene Limbo, MD;  Location: [place];  Service: Plastics;  Laterality: Bilateral;   IR CV LINE INJECTION  05/31/2017   IR FLUORO GUIDE CV LINE LEFT  05/31/2017   IR FLUORO GUIDE CV LINE RIGHT  04/06/2017   IR FLUORO GUIDE CV MIDLINE PICC RIGHT  05/30/2017   IR NEPHROSTOGRAM LEFT INITIAL PLACEMENT  09/02/2017   IR NEPHROSTOGRAM LEFT THRU EXISTING ACCESS  11/29/2017   IR NEPHROSTOGRAM RIGHT INITIAL PLACEMENT  09/02/2017   IR NEPHROSTOGRAM RIGHT THRU EXISTING ACCESS  09/13/2017   IR NEPHROSTOGRAM RIGHT THRU EXISTING ACCESS  11/29/2017   IR NEPHROSTOMY  EXCHANGE LEFT  11/28/2017   IR NEPHROSTOMY EXCHANGE LEFT  01/05/2018   IR NEPHROSTOMY EXCHANGE LEFT  02/16/2018   IR NEPHROSTOMY EXCHANGE LEFT  03/30/2018   IR NEPHROSTOMY EXCHANGE LEFT  05/12/2018   IR NEPHROSTOMY EXCHANGE LEFT  06/21/2018   IR NEPHROSTOMY EXCHANGE LEFT  08/04/2018   IR NEPHROSTOMY EXCHANGE LEFT  09/18/2018   IR NEPHROSTOMY  EXCHANGE LEFT  10/09/2018   IR NEPHROSTOMY EXCHANGE LEFT  10/27/2018   IR NEPHROSTOMY EXCHANGE LEFT  11/21/2018   IR NEPHROSTOMY EXCHANGE LEFT  01/05/2019   IR NEPHROSTOMY EXCHANGE LEFT  02/15/2019   IR NEPHROSTOMY EXCHANGE LEFT  03/29/2019   IR NEPHROSTOMY EXCHANGE RIGHT  10/02/2017   IR NEPHROSTOMY EXCHANGE RIGHT  11/28/2017   IR NEPHROSTOMY EXCHANGE RIGHT  01/05/2018   IR NEPHROSTOMY EXCHANGE RIGHT  02/16/2018   IR NEPHROSTOMY EXCHANGE RIGHT  03/30/2018   IR NEPHROSTOMY EXCHANGE RIGHT  05/12/2018   IR NEPHROSTOMY EXCHANGE RIGHT  06/21/2018   IR NEPHROSTOMY EXCHANGE RIGHT  08/04/2018   IR NEPHROSTOMY EXCHANGE RIGHT  09/18/2018   IR NEPHROSTOMY EXCHANGE RIGHT  10/27/2018   IR NEPHROSTOMY EXCHANGE RIGHT  11/21/2018   IR NEPHROSTOMY EXCHANGE RIGHT  01/05/2019   IR NEPHROSTOMY EXCHANGE RIGHT  02/15/2019   IR NEPHROSTOMY EXCHANGE RIGHT  03/29/2019   IR NEPHROSTOMY PLACEMENT LEFT  10/02/2017   IR RADIOLOGIST EVAL & MGMT  05/03/2017   IR US GUIDE VASC ACCESS LEFT  05/31/2017   IR US GUIDE VASC ACCESS RIGHT  04/06/2017   IR US GUIDE VASC ACCESS RIGHT  05/30/2017   LAPAROSCOPIC CHOLECYSTECTOMY  1990   LIPOSUCTION WITH LIPOFILLING Bilateral 04/16/2016   Procedure: LIPOSUCTION WITH LIPOFILLING TO BILATERAL CHEST;  Surgeon: Irene Limbo, MD;  Location: [place];  Service: Plastics;  Laterality: Bilateral;   MASTECTOMY W/ SENTINEL NODE BIOPSY Bilateral 12/11/2015   Procedure: RIGHT PROPHYLACTIC MASTECTOMY, LEFT TOTAL MASTECTOMY WITH LEFT AXILLARY SENTINEL LYMPH NODE BIOPSY;  Surgeon: Stark Klein, MD;  Location: [place];  Service:  General;  Laterality: Bilateral;   OSTOMY N/A 11/19/2014   Procedure: OSTOMY;  Surgeon: Michael [name], MD;  [facility];  Service: General;  Laterality: N/A;   PROCTOSCOPY N/A 04/01/2017   Procedure: RIDGE PROCTOSCOPY;  Surgeon: Michael [name], MD;  [facility];  Service: General;  Laterality: N/A;   REMOVAL OF BILATERAL TISSUE EXPANDERS WITH PLACEMENT OF BILATERAL BREAST IMPLANTS Bilateral 04/16/2016   Procedure: REMOVAL OF BILATERAL TISSUE EXPANDERS WITH PLACEMENT OF BILATERAL BREAST IMPLANTS;  Surgeon: Irene Limbo, MD;  Location: [place];  Service: Plastics;  Laterality: Bilateral;   ROBOTIC ASSISTED LAP VAGINAL HYSTERECTOMY N/A 11/19/2014   Procedure: ROBOTIC LYSIS OF ADHESIONS, CONVERTED TO LAPAROTOMY RADICAL UPPER VAGINECTOMY,LOW ANTERIOR BOWEL RESECTION, COLOSTOMY, BILATERAL URETERAL STENT PLACEMENT AND CYSTONOMY CLOSURE;  Surgeon: Everitt Amber, MD;  [facility];  Service: Gynecology;  Laterality: N/A;   TISSUE EXPANDER FILLING Bilateral 12/26/2015   Procedure: EXPANSION OF BILATERAL CHEST TISSUE EXPANDERS (60 mL- Right; 75 mL- Left);  Surgeon: Irene Limbo, MD;  Location: [place];  Service: Plastics;  Laterality: Bilateral;   TONSILLECTOMY     TOTAL ABDOMINAL HYSTERECTOMY  March 2006   [facility]   and Bilateral Salpingoophorectomy/  staging for Ovarian cancer/  an   XI ROBOTIC ASSISTED LOWER ANTERIOR RESECTION N/A 04/01/2017   Procedure: XI ROBOTIC VS LAPAROSCOPIC COLOSTOMY TAKEDOWN WITH LYSIS OF ADHESIONS.;  Surgeon: Michael [name], MD;  [facility];  Service: General;  Laterality: N/A;  ERAS PATHWAY    SOCIAL HISTORY: Social History   Tobacco Use   Smoking status: Never Smoker   Smokeless tobacco: Never Used  Substance Use Topics   Alcohol use: Not Currently   Drug use: No    FAMILY HISTORY: Family History  Problem Relation Age of Onset   Cancer Mother 89       stomach ca  Hypertension Mother    Cancer  Father 80       prostate ca   Diabetes Father    Heart disease Father        CABG   Hypertension Father    Hyperlipidemia Father    Obesity Father    Breast cancer Maternal Aunt        dx in her 32s   Lymphoma Paternal 74    Brain cancer Paternal Grandfather    Ovarian cancer Other    Diabetes Sister    Hypertension Brother y-10   Heart disease Brother        CABG   Diabetes Brother    ROS: Review of Systems  Endo/Heme/Allergies:       Negative for hypoglycemia.   PHYSICAL EXAM: Pt in no acute distress  RECENT LABS AND TESTS: BMET    Component Value Date/Time   NA 138 03/29/2019 1143   NA 142 11/29/2018   NA 141 01/24/2017 1228   K 4.1 03/29/2019 1143   K 4.6 01/24/2017 1228   CL 105 03/29/2019 1143   CO2 24 03/29/2019 1143   CO2 29 01/24/2017 1228   GLUCOSE 111 (H) 03/29/2019 1143   GLUCOSE 84 01/24/2017 1228   BUN 28 (H) 03/29/2019 1143   BUN 22 (A) 11/29/2018   BUN 22.2 01/24/2017 1228   CREATININE 1.56 (H) 03/29/2019 1143   CREATININE 1.98 (H) 03/27/2018 1324   CREATININE 0.8 01/24/2017 1228   CALCIUM 9.4 03/29/2019 1143   CALCIUM 10.2 01/24/2017 1228   GFRNONAA 35 (L) 03/29/2019 1143   GFRNONAA 26 (L) 03/27/2018 1324   GFRNONAA 78 12/16/2014 1530   GFRAA 40 (L) 03/29/2019 1143   GFRAA 30 (L) 03/27/2018 1324   GFRAA >89 12/16/2014 1530   Lab Results  Component Value Date   HGBA1C 7.9 (H) 09/01/2017   HGBA1C 5.3 04/01/2017   HGBA1C 5.8 (H) 09/05/2015   HGBA1C 6.8 (H) 11/21/2014   HGBA1C 6.3 (H) 11/07/2014   No results found for: INSULIN CBC    Component Value Date/Time   WBC 3.1 (L) 03/29/2019 1143   RBC 3.69 (L) 03/29/2019 1143   HGB 11.0 (L) 03/29/2019 1143   HGB 10.2 (L) 03/27/2018 1324   HGB 12.4 07/29/2017 1444   HCT 36.0 03/29/2019 1143   HCT 38.0 07/29/2017 1444   PLT 210 03/29/2019 1143   PLT 268 03/27/2018 1324   PLT 260 07/29/2017 1444   MCV 97.6 03/29/2019 1143   MCV 90.9 07/29/2017 1444   MCH 29.8 03/29/2019  1143   MCHC 30.6 03/29/2019 1143   RDW 15.1 03/29/2019 1143   RDW 15.5 (H) 07/29/2017 1444   LYMPHSABS 0.7 02/15/2019 1312   LYMPHSABS 0.8 (L) 07/29/2017 1444   MONOABS 0.7 02/15/2019 1312   MONOABS 1.0 (H) 07/29/2017 1444   EOSABS 0.2 02/15/2019 1312   EOSABS 0.0 07/29/2017 1444   BASOSABS 0.0 02/15/2019 1312   BASOSABS 0.0 07/29/2017 1444   Iron/TIBC/Ferritin/ %Sat    Component Value Date/Time   IRON 7 (L) 08/31/2017 0451   IRON 12 (L) 11/29/2014 1251   TIBC 164 (L) 08/31/2017 0451   TIBC 331 11/29/2014 1251   FERRITIN 27 11/29/2014 1251   IRONPCTSAT 4 (L) 08/31/2017 0451   IRONPCTSAT 4 (L) 11/29/2014 1251   Lipid Panel     Component Value Date/Time   CHOL 109 08/28/2015 1617   TRIG 89 04/18/2017 0427   HDL 43.80 08/28/2015 1617   CHOLHDL 2 08/28/2015 1617  VLDL 19.8 08/28/2015 1617   LDLCALC 45 08/28/2015 1617   Hepatic Function Panel     Component Value Date/Time   PROT 7.4 01/17/2019 0351   PROT 6.9 01/24/2017 1228   ALBUMIN 2.4 (L) 01/22/2019 0343   ALBUMIN 3.4 (L) 01/24/2017 1228   AST 24 01/17/2019 0351   AST 14 (L) 03/27/2018 1324   AST 16 01/24/2017 1228   ALT 53 (H) 01/17/2019 0351   ALT 18 03/27/2018 1324   ALT 14 01/24/2017 1228   ALKPHOS 305 (H) 01/17/2019 0351   ALKPHOS 90 01/24/2017 1228   BILITOT 0.6 01/17/2019 0351   BILITOT 0.2 (L) 03/27/2018 1324   BILITOT 0.34 01/24/2017 1228      Component Value Date/Time   TSH 2.408 08/30/2017 0623   TSH 0.63 08/28/2015 1617   Results for MARGARIT, MINSHALL (MRN [id number]) as of 05/10/2019 09:42  Ref. Range 04/05/2018 11:45  Vitamin D, 25-Hydroxy Latest Ref Range: 30.0 - 100.0 ng/mL 40.8   I, Michaelene Song, am acting as Location manager for [employer], FNP  I have reviewed the above documentation for accuracy and completeness, and I agree with the above.  - Dawn Whitmire, FNP-C.

## 2019-05-11 DIAGNOSIS — E785 Hyperlipidemia, unspecified: Secondary | ICD-10-CM | POA: Diagnosis not present

## 2019-05-11 DIAGNOSIS — L89309 Pressure ulcer of unspecified buttock, unspecified stage: Secondary | ICD-10-CM | POA: Diagnosis not present

## 2019-05-11 DIAGNOSIS — E860 Dehydration: Secondary | ICD-10-CM | POA: Diagnosis not present

## 2019-05-14 DIAGNOSIS — N321 Vesicointestinal fistula: Secondary | ICD-10-CM | POA: Diagnosis not present

## 2019-05-14 DIAGNOSIS — N189 Chronic kidney disease, unspecified: Secondary | ICD-10-CM | POA: Diagnosis not present

## 2019-05-14 DIAGNOSIS — E039 Hypothyroidism, unspecified: Secondary | ICD-10-CM | POA: Diagnosis not present

## 2019-05-14 DIAGNOSIS — Z48815 Encounter for surgical aftercare following surgery on the digestive system: Secondary | ICD-10-CM | POA: Diagnosis not present

## 2019-05-14 DIAGNOSIS — Z436 Encounter for attention to other artificial openings of urinary tract: Secondary | ICD-10-CM | POA: Diagnosis not present

## 2019-05-14 DIAGNOSIS — D62 Acute posthemorrhagic anemia: Secondary | ICD-10-CM | POA: Diagnosis not present

## 2019-05-14 DIAGNOSIS — E1140 Type 2 diabetes mellitus with diabetic neuropathy, unspecified: Secondary | ICD-10-CM | POA: Diagnosis not present

## 2019-05-14 DIAGNOSIS — K219 Gastro-esophageal reflux disease without esophagitis: Secondary | ICD-10-CM | POA: Diagnosis not present

## 2019-05-14 DIAGNOSIS — E1122 Type 2 diabetes mellitus with diabetic chronic kidney disease: Secondary | ICD-10-CM | POA: Diagnosis not present

## 2019-05-14 DIAGNOSIS — Z452 Encounter for adjustment and management of vascular access device: Secondary | ICD-10-CM | POA: Diagnosis not present

## 2019-05-14 DIAGNOSIS — K604 Rectal fistula: Secondary | ICD-10-CM | POA: Diagnosis not present

## 2019-05-14 DIAGNOSIS — Z433 Encounter for attention to colostomy: Secondary | ICD-10-CM | POA: Diagnosis not present

## 2019-05-14 DIAGNOSIS — Z48816 Encounter for surgical aftercare following surgery on the genitourinary system: Secondary | ICD-10-CM | POA: Diagnosis not present

## 2019-05-15 DIAGNOSIS — Z933 Colostomy status: Secondary | ICD-10-CM | POA: Diagnosis not present

## 2019-05-15 DIAGNOSIS — R5383 Other fatigue: Secondary | ICD-10-CM | POA: Diagnosis not present

## 2019-05-15 DIAGNOSIS — Z9049 Acquired absence of other specified parts of digestive tract: Secondary | ICD-10-CM | POA: Diagnosis not present

## 2019-05-15 DIAGNOSIS — R1030 Lower abdominal pain, unspecified: Secondary | ICD-10-CM | POA: Diagnosis not present

## 2019-05-15 DIAGNOSIS — K604 Rectal fistula: Secondary | ICD-10-CM | POA: Diagnosis not present

## 2019-05-17 DIAGNOSIS — Z48815 Encounter for surgical aftercare following surgery on the digestive system: Secondary | ICD-10-CM | POA: Diagnosis not present

## 2019-05-17 DIAGNOSIS — K219 Gastro-esophageal reflux disease without esophagitis: Secondary | ICD-10-CM | POA: Diagnosis not present

## 2019-05-17 DIAGNOSIS — K604 Rectal fistula: Secondary | ICD-10-CM | POA: Diagnosis not present

## 2019-05-17 DIAGNOSIS — Z433 Encounter for attention to colostomy: Secondary | ICD-10-CM | POA: Diagnosis not present

## 2019-05-17 DIAGNOSIS — Z452 Encounter for adjustment and management of vascular access device: Secondary | ICD-10-CM | POA: Diagnosis not present

## 2019-05-17 DIAGNOSIS — E1122 Type 2 diabetes mellitus with diabetic chronic kidney disease: Secondary | ICD-10-CM | POA: Diagnosis not present

## 2019-05-17 DIAGNOSIS — N189 Chronic kidney disease, unspecified: Secondary | ICD-10-CM | POA: Diagnosis not present

## 2019-05-17 DIAGNOSIS — N321 Vesicointestinal fistula: Secondary | ICD-10-CM | POA: Diagnosis not present

## 2019-05-17 DIAGNOSIS — Z48816 Encounter for surgical aftercare following surgery on the genitourinary system: Secondary | ICD-10-CM | POA: Diagnosis not present

## 2019-05-17 DIAGNOSIS — E039 Hypothyroidism, unspecified: Secondary | ICD-10-CM | POA: Diagnosis not present

## 2019-05-17 DIAGNOSIS — Z436 Encounter for attention to other artificial openings of urinary tract: Secondary | ICD-10-CM | POA: Diagnosis not present

## 2019-05-17 DIAGNOSIS — E1140 Type 2 diabetes mellitus with diabetic neuropathy, unspecified: Secondary | ICD-10-CM | POA: Diagnosis not present

## 2019-05-17 DIAGNOSIS — D62 Acute posthemorrhagic anemia: Secondary | ICD-10-CM | POA: Diagnosis not present

## 2019-05-18 DIAGNOSIS — E785 Hyperlipidemia, unspecified: Secondary | ICD-10-CM | POA: Diagnosis not present

## 2019-05-18 DIAGNOSIS — E860 Dehydration: Secondary | ICD-10-CM | POA: Diagnosis not present

## 2019-05-18 DIAGNOSIS — L89309 Pressure ulcer of unspecified buttock, unspecified stage: Secondary | ICD-10-CM | POA: Diagnosis not present

## 2019-05-21 ENCOUNTER — Encounter: Payer: Self-pay | Admitting: Family Medicine

## 2019-05-21 DIAGNOSIS — N321 Vesicointestinal fistula: Secondary | ICD-10-CM | POA: Diagnosis not present

## 2019-05-21 DIAGNOSIS — Z48816 Encounter for surgical aftercare following surgery on the genitourinary system: Secondary | ICD-10-CM | POA: Diagnosis not present

## 2019-05-21 DIAGNOSIS — Z452 Encounter for adjustment and management of vascular access device: Secondary | ICD-10-CM | POA: Diagnosis not present

## 2019-05-21 DIAGNOSIS — K604 Rectal fistula: Secondary | ICD-10-CM | POA: Diagnosis not present

## 2019-05-21 DIAGNOSIS — E1122 Type 2 diabetes mellitus with diabetic chronic kidney disease: Secondary | ICD-10-CM | POA: Diagnosis not present

## 2019-05-21 DIAGNOSIS — E1140 Type 2 diabetes mellitus with diabetic neuropathy, unspecified: Secondary | ICD-10-CM | POA: Diagnosis not present

## 2019-05-21 DIAGNOSIS — K219 Gastro-esophageal reflux disease without esophagitis: Secondary | ICD-10-CM | POA: Diagnosis not present

## 2019-05-21 DIAGNOSIS — Z433 Encounter for attention to colostomy: Secondary | ICD-10-CM | POA: Diagnosis not present

## 2019-05-21 DIAGNOSIS — Z436 Encounter for attention to other artificial openings of urinary tract: Secondary | ICD-10-CM | POA: Diagnosis not present

## 2019-05-21 DIAGNOSIS — E039 Hypothyroidism, unspecified: Secondary | ICD-10-CM | POA: Diagnosis not present

## 2019-05-21 DIAGNOSIS — N189 Chronic kidney disease, unspecified: Secondary | ICD-10-CM | POA: Diagnosis not present

## 2019-05-21 DIAGNOSIS — D62 Acute posthemorrhagic anemia: Secondary | ICD-10-CM | POA: Diagnosis not present

## 2019-05-21 DIAGNOSIS — Z48815 Encounter for surgical aftercare following surgery on the digestive system: Secondary | ICD-10-CM | POA: Diagnosis not present

## 2019-05-22 DIAGNOSIS — Z452 Encounter for adjustment and management of vascular access device: Secondary | ICD-10-CM | POA: Diagnosis not present

## 2019-05-22 DIAGNOSIS — N321 Vesicointestinal fistula: Secondary | ICD-10-CM | POA: Diagnosis not present

## 2019-05-22 DIAGNOSIS — E1140 Type 2 diabetes mellitus with diabetic neuropathy, unspecified: Secondary | ICD-10-CM | POA: Diagnosis not present

## 2019-05-22 DIAGNOSIS — E039 Hypothyroidism, unspecified: Secondary | ICD-10-CM | POA: Diagnosis not present

## 2019-05-22 DIAGNOSIS — Z436 Encounter for attention to other artificial openings of urinary tract: Secondary | ICD-10-CM | POA: Diagnosis not present

## 2019-05-22 DIAGNOSIS — K219 Gastro-esophageal reflux disease without esophagitis: Secondary | ICD-10-CM | POA: Diagnosis not present

## 2019-05-22 DIAGNOSIS — K604 Rectal fistula: Secondary | ICD-10-CM | POA: Diagnosis not present

## 2019-05-22 DIAGNOSIS — Z433 Encounter for attention to colostomy: Secondary | ICD-10-CM | POA: Diagnosis not present

## 2019-05-22 DIAGNOSIS — N189 Chronic kidney disease, unspecified: Secondary | ICD-10-CM | POA: Diagnosis not present

## 2019-05-22 DIAGNOSIS — D62 Acute posthemorrhagic anemia: Secondary | ICD-10-CM | POA: Diagnosis not present

## 2019-05-22 DIAGNOSIS — Z48816 Encounter for surgical aftercare following surgery on the genitourinary system: Secondary | ICD-10-CM | POA: Diagnosis not present

## 2019-05-22 DIAGNOSIS — E1122 Type 2 diabetes mellitus with diabetic chronic kidney disease: Secondary | ICD-10-CM | POA: Diagnosis not present

## 2019-05-22 DIAGNOSIS — Z48815 Encounter for surgical aftercare following surgery on the digestive system: Secondary | ICD-10-CM | POA: Diagnosis not present

## 2019-05-22 NOTE — Telephone Encounter (Signed)
Did she do ok with 25 mcg fentanyl last night --- if not we can try 37.5 for a few weeks and then drop to 25 mcg

## 2019-05-23 ENCOUNTER — Telehealth (INDEPENDENT_AMBULATORY_CARE_PROVIDER_SITE_OTHER): Payer: BC Managed Care – PPO | Admitting: Family Medicine

## 2019-05-23 ENCOUNTER — Other Ambulatory Visit: Payer: Self-pay

## 2019-05-23 ENCOUNTER — Encounter (INDEPENDENT_AMBULATORY_CARE_PROVIDER_SITE_OTHER): Payer: Self-pay | Admitting: Family Medicine

## 2019-05-23 DIAGNOSIS — E1122 Type 2 diabetes mellitus with diabetic chronic kidney disease: Secondary | ICD-10-CM

## 2019-05-23 DIAGNOSIS — E860 Dehydration: Secondary | ICD-10-CM | POA: Diagnosis not present

## 2019-05-23 DIAGNOSIS — Z6834 Body mass index (BMI) 34.0-34.9, adult: Secondary | ICD-10-CM | POA: Diagnosis not present

## 2019-05-23 DIAGNOSIS — N184 Chronic kidney disease, stage 4 (severe): Secondary | ICD-10-CM | POA: Diagnosis not present

## 2019-05-23 DIAGNOSIS — E669 Obesity, unspecified: Secondary | ICD-10-CM | POA: Diagnosis not present

## 2019-05-23 DIAGNOSIS — E785 Hyperlipidemia, unspecified: Secondary | ICD-10-CM | POA: Diagnosis not present

## 2019-05-23 DIAGNOSIS — L89309 Pressure ulcer of unspecified buttock, unspecified stage: Secondary | ICD-10-CM | POA: Diagnosis not present

## 2019-05-24 ENCOUNTER — Other Ambulatory Visit: Payer: Self-pay | Admitting: Family Medicine

## 2019-05-24 DIAGNOSIS — G8929 Other chronic pain: Secondary | ICD-10-CM

## 2019-05-24 MED ORDER — PREGABALIN 50 MG PO CAPS: 50.0000 mg | ORAL_CAPSULE | Freq: Two times a day (BID) | ORAL | 2 refills | Status: DC

## 2019-05-24 MED ORDER — FENTANYL 37.5 MCG/HR TD PT72: 1.0000 | MEDICATED_PATCH | TRANSDERMAL | 0 refills | Status: DC

## 2019-05-24 NOTE — Progress Notes (Signed)
Office: [phone]  /  Fax: [phone] TeleHealth Visit:  [name] has verbally consented to this TeleHealth visit today. The patient is located at home, the provider is located at the [employer] [facility] office. The participants in this visit include the listed provider and patient. The visit was conducted today via face time.  HPI:   Chief Complaint: OBESITY [name] is here to discuss her progress with her obesity treatment plan. She is on the keep a food journal with 1300 calories and 75 grams of protein daily and is following her eating plan approximately 0 % of the time. She states she is exercising 0 minutes 0 times per week. [name] is recovering from extensive abdominal and pelvic surgery, and she hasn't been able to follow her eating plan. She is mindful however, and trying to drink protein drinks. She states her weight today is 180 lbs. She states her blood pressure was 110/60 with a pulse of 70 on Monday. We were unable to weigh the patient today for this TeleHealth visit. She feels as if she has lost weight since her last visit. She has lost 18 lbs since starting treatment with [place].  Diabetes II [name] has a diagnosis of diabetes type II. [name]'s A1c done last month at [facility] was 5.6. she has had some wide blood sugars excursions with surgery and hospitalization, but is doing better mostly averaging between 120 and 130's on Januvia. She denies hypoglycemia. She has been working on intensive lifestyle modifications including diet, exercise, and weight loss to help control her blood glucose levels.  ASSESSMENT AND PLAN:  Type 2 diabetes mellitus with stage 4 chronic kidney disease, without long-term current use of insulin (HCC)  Class 1 obesity with serious comorbidity and body mass index (BMI) of 34.0 to 34.9 in adult, unspecified obesity type  PLAN:  Diabetes II [name] has been given extensive diabetes education by myself today including ideal fasting and  post-prandial blood glucose readings, individual ideal Hgb A1c goals and hypoglycemia prevention. We discussed the importance of good blood sugar control to decrease the likelihood of diabetic complications such as nephropathy, neuropathy, limb loss, blindness, coronary artery disease, and death. We discussed the importance of intensive lifestyle modification including diet, exercise and weight loss as the first line treatment for diabetes. [name] agrees to continue her diabetes medications and diet. She will follow up at the agreed upon time.  I spent > than 50% of the 25 minute visit on counseling as documented in the note.  Obesity [name] is currently in the action stage of change. As such, her goal is to continue with weight loss efforts She has agreed to follow the Category 2 plan [name] has been instructed to work up to a goal of 150 minutes of combined cardio and strengthening exercise per week for weight loss and overall health benefits. We discussed the following Behavioral Modification Strategies today: increasing lean protein intake and no skipping meals Delight is to slowly work up to regular food as tolerated and will continue to monitor.  [name] has agreed to follow up with our clinic in 3 weeks. She was informed of the importance of frequent follow up visits to maximize her success with intensive lifestyle modifications for her multiple health conditions.  ALLERGIES: Allergies  Allergen Reactions   Penicillins Swelling    Facial swelling/childhood allergy Has patient had a PCN reaction causing immediate rash, facial/tongue/throat swelling, SOB or lightheadedness with hypotension: Yes Has patient had a PCN reaction causing severe rash  involving mucus membranes or skin necrosis: Yes Has patient had a PCN reaction that required hospitalization yes Has patient had a PCN reaction occurring within the last 10 years: No If all of the above answers are "NO", then may proceed with  Cephalosporin use.    Cefaclor Rash    Ceclor   Erythromycin Other (See Comments)    Gastritis, abd cramps   Tape Rash    blisters   Trimethoprim Rash   Ultram [Tramadol] Hives   Cephalosporins Rash   Fluconazole Rash   Oxycodone Other (See Comments)    " I just feel weird"   Pectin Rash    Pectin ring for stoma   Septra [Sulfamethoxazole-Trimethoprim] Rash   Sulfa Antibiotics Rash    MEDICATIONS: Current Outpatient Medications on File Prior to Visit  Medication Sig Dispense Refill   acetaminophen (TYLENOL) 325 MG tablet Take 2 tablets (650 mg total) by mouth every 6 (six) hours as needed. 30 tablet 1   anastrozole (ARIMIDEX) 1 MG tablet Take 1 tablet (1 mg total) by mouth daily. 90 tablet 3   Biotin 5 MG TABS Take 5 mg by mouth daily.      Cholecalciferol (VITAMIN D3) 10000 UNITS capsule Take 10,000 Units by mouth every Sunday.      clobetasol (OLUX) 0.05 % topical foam Apply topically 2 (two) times daily.     diphenhydrAMINE (BENADRYL) 25 mg capsule Take 1 capsule (25 mg total) by mouth every 8 (eight) hours as needed for itching, allergies or sleep. 30 capsule 0   ferrous sulfate 325 (65 FE) MG tablet Take 1 tablet (325 mg total) by mouth at bedtime. 30 tablet 3   filgrastim-sndz (ZARXIO) 480 MCG/0.8ML SOSY injection Inject 0.8 mLs (480 mcg total) into the skin as directed. Inject 480 mcg every 5 days 0.8 mL 99   HYDROcodone-acetaminophen (NORCO/VICODIN) 5-325 MG tablet Take 2 tablets by mouth every 6 (six) hours as needed for moderate pain. 30 tablet 0   JANUVIA 50 MG tablet Take 50 mg by mouth daily.      levothyroxine (SYNTHROID, LEVOTHROID) 150 MCG tablet Take 1 tablet (150 mcg total) by mouth daily before breakfast. 30 tablet 1   loratadine (CLARITIN) 10 MG tablet Take 10 mg by mouth daily.      nitrofurantoin, macrocrystal-monohydrate, (MACROBID) 100 MG capsule Take 100 mg by mouth at bedtime.     omega-3 acid ethyl esters (LOVAZA) 1 G capsule  Take 1 g by mouth 2 (two) times daily.      Polyethyl Glycol-Propyl Glycol (SYSTANE OP) Place 1 drop into both eyes daily as needed (dry eyes).      Prenatal Vit-Fe Fumarate-FA (PRENATAL VITAMIN PO) Take 1 capsule by mouth daily. Takes prenatal because there are no dyes in it     rosuvastatin (CRESTOR) 10 MG tablet Take 10 mg by mouth every evening.      Saccharomyces boulardii (FLORASTOR PO) Take 1 capsule by mouth daily.     No current facility-administered medications on file prior to visit.     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+, HER2 negative/  12-11-2015 bilateral mastecotmy w/ reconstruction (no radiation and no chemo)  Cancer of corpus uteri, except isthmus [facility])  oncologist-- dr Denman [name] and dr Alvy Bimler    10-15-2004  dx endometroid endometrial and ovarian cancer s/p  chemotheapy and surgery(TAH w/ BSO) :  recurrent 11-19-2014 post pelvic surgery and radiation 01-29-2015 to 03-10-2015   Chronic idiopathic neutropenia ([place])    presumed related to chemotherapy March 2006--- followed by dr Alvy Bimler (treatment w/ G-CSF injections   Chronic nausea    Chronic pain    perineal/ anal  area from bladder pad irritates skin , right flank pain   CKD stage G2/A3, GFR 60-89 and albumin creatinine ratio >300 mg/g    nephrologist-  dr Madelon Lips   Colovesical fistula    Diabetic retinopathy, background ([place])    Difficult intravenous access    small veins--- hx PICC lines   DM type 2 (diabetes mellitus, type 2) ([place])    monitored by dr Legrand Como altheimer   Dysuria    Environmental and seasonal allergies    Fatty liver 02/08/2016   CT   Generalized muscle weakness    GERD  (gastroesophageal reflux disease)    Hiatal hernia    History of abdominal abscess 04/16/2017   post surgery 04-01-2017  --- resolved 10/ 2018   History of gastric polyp    2014  duodenum   History of ileus 04/16/2017   resolved w/ no surgical intervention   History of radiation therapy    01-29-2015 to 03-10-2015  pelvis 50.4Gy   Hypothyroidism    monitored by dr Legrand Como altheimer   IBS (irritable bowel syndrome)    Ileostomy in place ([place]) 04/01/2017   created at same time colostomy takedown.   Joint pain    Leg edema    Lower urinary tract symptoms (LUTS)    urge urinary  incontinence   Mixed dyslipidemia    Multiple thyroid nodules    Managed by Dr. Harlow Asa   Nephrostomy status [facility])    Palpitations    Pelvic abscess in female 04/16/2017   PONV (postoperative nausea and vomiting)    "scopolamine patch works for me"   Radiation-induced dermatitis    contact dermatitis , radiation completed, rash only on ankles now.   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   Vitamin D deficiency    Wears glasses     PAST SURGICAL HISTORY: Past Surgical History:  Procedure Laterality Date   APPENDECTOMY     biopsy thyroid nodules     BREAST RECONSTRUCTION WITH PLACEMENT OF TISSUE EXPANDER AND FLEX HD (ACELLULAR HYDRATED DERMIS) Bilateral 12/11/2015   Procedure: BILATERAL BREAST RECONSTRUCTION WITH PLACEMENT OF TISSUE EXPANDERS;  Surgeon: Irene Limbo, MD;  Location: [employer];  Service: Plastics;  Laterality: Bilateral;   COLONOSCOPY WITH PROPOFOL N/A 08/21/2013   Procedure: COLONOSCOPY WITH PROPOFOL;  Surgeon: Cleotis Nipper, MD;  [facility];  Service: Endoscopy;  Laterality: N/A;   COLOSTOMY TAKEDOWN N/A 12/04/2014   Procedure: LAPROSCOPIC LYSIS OF ADHESIONS, SPLENIC MOBILIZATION,  RELOCATION OF COLOSTOMY, DEBRIDEMENT INITIAL COLOSTOMY SITE;  Surgeon: Michael [name], MD;  [facility];  Service: General;  Laterality: N/A;   CYSTOGRAM N/A 06/01/2017   Procedure: CYSTOGRAM;  Surgeon: Alexis Frock, MD;  [facility];  Service: Urology;  Laterality: N/A;   CYSTOSCOPY W/ RETROGRADES Right 11/21/2015   Procedure: CYSTOSCOPY WITH RETROGRADE PYELOGRAM;  Surgeon: Alexis Frock, MD;  [facility];  Service: Urology;  Laterality: Right;   CYSTOSCOPY W/ URETERAL STENT PLACEMENT Right 11/21/2015   Procedure: CYSTOSCOPY WITH STENT REPLACEMENT;  Surgeon: Alexis Frock, MD;  [facility];  Service: Urology;  Laterality: Right;   CYSTOSCOPY W/ URETERAL STENT PLACEMENT Right 03/10/2016   Procedure: CYSTOSCOPY WITH STENT REPLACEMENT;  Surgeon: Alexis Frock, MD;  Location[facility];  Service: Urology;  Laterality: Right;   CYSTOSCOPY W/ URETERAL STENT PLACEMENT Right 06/30/2016   Procedure: CYSTOSCOPY WITH RETROGRADE PYELOGRAM/URETERAL STENT EXCHANGE;  Surgeon: Alexis Frock, MD;  Location: [facility];  Service: Urology;  Laterality: Right;   CYSTOSCOPY W/ URETERAL STENT PLACEMENT N/A 06/01/2017   Procedure: CYSTOSCOPY WITH EXAM UNDER ANESTHESIA;  Surgeon: Alexis Frock, MD;  [facility];  Service: Urology;  Laterality: N/A;   CYSTOSCOPY W/ URETERAL STENT PLACEMENT Right 08/17/2017   Procedure: CYSTOSCOPY WITH RETROGRADE PYELOGRAM/URETERAL STENT REMOVAL;  Surgeon: Alexis Frock, MD;  Location: [facility];  Service: Urology;  Laterality: Right;   CYSTOSCOPY WITH RETROGRADE PYELOGRAM, URETEROSCOPY AND STENT PLACEMENT Right 03/20/2015   Procedure: CYSTOSCOPY WITH RETROGRADE PYELOGRAM, URETEROSCOPY WITH BALLOON DILATION AND STENT PLACEMENT ON RIGHT;  Surgeon: Alexis Frock, MD;  Location: [facility];  Service: Urology;  Laterality: Right;   CYSTOSCOPY WITH RETROGRADE PYELOGRAM, URETEROSCOPY AND STENT  PLACEMENT Right 05/02/2015   Procedure: CYSTOSCOPY WITH RIGHT RETROGRADE PYELOGRAM,  DIAGNOSTIC URETEROSCOPY AND STENT PULL ;  Surgeon: Alexis Frock, MD;  Location: [facility];  Service: Urology;  Laterality: Right;   CYSTOSCOPY WITH RETROGRADE PYELOGRAM, URETEROSCOPY AND STENT PLACEMENT Right 09/05/2015   Procedure: CYSTOSCOPY WITH RETROGRADE PYELOGRAM,  AND STENT PLACEMENT;  Surgeon: Alexis Frock, MD;  [facility];  Service: Urology;  Laterality: Right;   CYSTOSCOPY WITH RETROGRADE PYELOGRAM, URETEROSCOPY AND STENT PLACEMENT Right 04/01/2017   Procedure: CYSTOSCOPY WITH RETROGRADE PYELOGRAM, URETEROSCOPY AND STENT PLACEMENT;  Surgeon: Alexis Frock, MD;  [facility];  Service: Urology;  Laterality: Right;   CYSTOSCOPY WITH STENT PLACEMENT Right 10/27/2016   Procedure: CYSTOSCOPY WITH STENT CHANGE and right retrograde pyelogram;  Surgeon: Alexis Frock, MD;  Location: [facility];  Service: Urology;  Laterality: Right;   EUS N/A 10/02/2014   Procedure: LOWER ENDOSCOPIC ULTRASOUND (EUS);  Surgeon: Arta Silence, MD;  Location: [facility];  Service: Endoscopy;  Laterality: N/A;   EXCISION SOFT TISSUE MASS RIGHT FOREMAN  12-08-2006   EYE SURGERY  as child   pytosis of eyelids repair   INCISION AND DRAINAGE OF WOUND Bilateral 12/26/2015   Procedure: DEBRIDEMENT OF BILATERAL MASTECTOMY FLAPS;  Surgeon: Irene Limbo, MD;  Location: [place];  Service: Plastics;  Laterality: Bilateral;   IR CV LINE INJECTION  05/31/2017   IR FLUORO GUIDE CV LINE LEFT  05/31/2017   IR FLUORO GUIDE CV LINE RIGHT  04/06/2017   IR FLUORO GUIDE CV MIDLINE PICC RIGHT  05/30/2017   IR NEPHROSTOGRAM LEFT INITIAL PLACEMENT  09/02/2017   IR NEPHROSTOGRAM LEFT THRU EXISTING ACCESS  11/29/2017   IR NEPHROSTOGRAM RIGHT INITIAL PLACEMENT  09/02/2017   IR NEPHROSTOGRAM RIGHT THRU EXISTING ACCESS  09/13/2017   IR NEPHROSTOGRAM RIGHT THRU EXISTING ACCESS   11/29/2017   IR NEPHROSTOMY EXCHANGE LEFT  11/28/2017   IR NEPHROSTOMY EXCHANGE LEFT  01/05/2018   IR NEPHROSTOMY EXCHANGE LEFT  02/16/2018   IR NEPHROSTOMY EXCHANGE LEFT  03/30/2018   IR NEPHROSTOMY EXCHANGE LEFT  05/12/2018   IR NEPHROSTOMY EXCHANGE LEFT  06/21/2018   IR  NEPHROSTOMY EXCHANGE LEFT  08/04/2018   IR NEPHROSTOMY EXCHANGE LEFT  09/18/2018   IR NEPHROSTOMY EXCHANGE LEFT  10/09/2018   IR NEPHROSTOMY EXCHANGE LEFT  10/27/2018   IR NEPHROSTOMY EXCHANGE LEFT  11/21/2018   IR NEPHROSTOMY EXCHANGE LEFT  01/05/2019   IR NEPHROSTOMY EXCHANGE LEFT  02/15/2019   IR NEPHROSTOMY EXCHANGE LEFT  03/29/2019   IR NEPHROSTOMY EXCHANGE RIGHT  10/02/2017   IR NEPHROSTOMY EXCHANGE RIGHT  11/28/2017   IR NEPHROSTOMY EXCHANGE RIGHT  01/05/2018   IR NEPHROSTOMY EXCHANGE RIGHT  02/16/2018   IR NEPHROSTOMY EXCHANGE RIGHT  03/30/2018   IR NEPHROSTOMY EXCHANGE RIGHT  05/12/2018   IR NEPHROSTOMY EXCHANGE RIGHT  06/21/2018   IR NEPHROSTOMY EXCHANGE RIGHT  08/04/2018   IR NEPHROSTOMY EXCHANGE RIGHT  09/18/2018   IR NEPHROSTOMY EXCHANGE RIGHT  10/27/2018   IR NEPHROSTOMY EXCHANGE RIGHT  11/21/2018   IR NEPHROSTOMY EXCHANGE RIGHT  01/05/2019   IR NEPHROSTOMY EXCHANGE RIGHT  02/15/2019   IR NEPHROSTOMY EXCHANGE RIGHT  03/29/2019   IR NEPHROSTOMY PLACEMENT LEFT  10/02/2017   IR RADIOLOGIST EVAL & MGMT  05/03/2017   IR US GUIDE VASC ACCESS LEFT  05/31/2017   IR US GUIDE VASC ACCESS RIGHT  04/06/2017   IR US GUIDE VASC ACCESS RIGHT  05/30/2017   LAPAROSCOPIC CHOLECYSTECTOMY  1990   LIPOSUCTION WITH LIPOFILLING Bilateral 04/16/2016   Procedure: LIPOSUCTION WITH LIPOFILLING TO BILATERAL CHEST;  Surgeon: Irene Limbo, MD;  Location: [place];  Service: Plastics;  Laterality: Bilateral;   MASTECTOMY W/ SENTINEL NODE BIOPSY Bilateral 12/11/2015   Procedure: RIGHT PROPHYLACTIC MASTECTOMY, LEFT TOTAL MASTECTOMY WITH LEFT AXILLARY SENTINEL LYMPH NODE BIOPSY;  Surgeon: Stark Klein, MD;   Location: [place];  Service: General;  Laterality: Bilateral;   OSTOMY N/A 11/19/2014   Procedure: OSTOMY;  Surgeon: Michael [name], MD;  [facility];  Service: General;  Laterality: N/A;   PROCTOSCOPY N/A 04/01/2017   Procedure: RIDGE PROCTOSCOPY;  Surgeon: Michael [name], MD;  [facility];  Service: General;  Laterality: N/A;   REMOVAL OF BILATERAL TISSUE EXPANDERS WITH PLACEMENT OF BILATERAL BREAST IMPLANTS Bilateral 04/16/2016   Procedure: REMOVAL OF BILATERAL TISSUE EXPANDERS WITH PLACEMENT OF BILATERAL BREAST IMPLANTS;  Surgeon: Irene Limbo, MD;  Location: [place];  Service: Plastics;  Laterality: Bilateral;   ROBOTIC ASSISTED LAP VAGINAL HYSTERECTOMY N/A 11/19/2014   Procedure: ROBOTIC LYSIS OF ADHESIONS, CONVERTED TO LAPAROTOMY RADICAL UPPER VAGINECTOMY,LOW ANTERIOR BOWEL RESECTION, COLOSTOMY, BILATERAL URETERAL STENT PLACEMENT AND CYSTONOMY CLOSURE;  Surgeon: Everitt Amber, MD;  [facility];  Service: Gynecology;  Laterality: N/A;   TISSUE EXPANDER FILLING Bilateral 12/26/2015   Procedure: EXPANSION OF BILATERAL CHEST TISSUE EXPANDERS (60 mL- Right; 75 mL- Left);  Surgeon: Irene Limbo, MD;  Location: [place];  Service: Plastics;  Laterality: Bilateral;   TONSILLECTOMY     TOTAL ABDOMINAL HYSTERECTOMY  March 2006   [facility]   and Bilateral Salpingoophorectomy/  staging for Ovarian cancer/  an   XI ROBOTIC ASSISTED LOWER ANTERIOR RESECTION N/A 04/01/2017   Procedure: XI ROBOTIC VS LAPAROSCOPIC COLOSTOMY TAKEDOWN WITH LYSIS OF ADHESIONS.;  Surgeon: Michael [name], MD;  [facility];  Service: General;  Laterality: N/A;  ERAS PATHWAY    SOCIAL HISTORY: Social History   Tobacco Use   Smoking status: Never Smoker   Smokeless tobacco: Never Used  Substance Use Topics   Alcohol use: Not Currently   Drug use: No    FAMILY HISTORY: Family History  Problem Relation Age  of Onset   Cancer Mother 79       stomach ca    Hypertension Mother    Cancer Father 47       prostate ca   Diabetes Father    Heart disease Father        CABG   Hypertension Father    Hyperlipidemia Father    Obesity Father    Breast cancer Maternal Aunt        dx in her 56s   Lymphoma Paternal 81    Brain cancer Paternal Grandfather    Ovarian cancer Other    Diabetes Sister    Hypertension Brother y-10   Heart disease Brother        CABG   Diabetes Brother     ROS: Review of Systems  Constitutional: Positive for weight loss.  Endo/Heme/Allergies:       Negative hypoglycemia    PHYSICAL EXAM: Pt in no acute distress  RECENT LABS AND TESTS: BMET    Component Value Date/Time   NA 138 03/29/2019 1143   NA 142 11/29/2018   NA 141 01/24/2017 1228   K 4.1 03/29/2019 1143   K 4.6 01/24/2017 1228   CL 105 03/29/2019 1143   CO2 24 03/29/2019 1143   CO2 29 01/24/2017 1228   GLUCOSE 111 (H) 03/29/2019 1143   GLUCOSE 84 01/24/2017 1228   BUN 28 (H) 03/29/2019 1143   BUN 22 (A) 11/29/2018   BUN 22.2 01/24/2017 1228   CREATININE 1.56 (H) 03/29/2019 1143   CREATININE 1.98 (H) 03/27/2018 1324   CREATININE 0.8 01/24/2017 1228   CALCIUM 9.4 03/29/2019 1143   CALCIUM 10.2 01/24/2017 1228   GFRNONAA 35 (L) 03/29/2019 1143   GFRNONAA 26 (L) 03/27/2018 1324   GFRNONAA 78 12/16/2014 1530   GFRAA 40 (L) 03/29/2019 1143   GFRAA 30 (L) 03/27/2018 1324   GFRAA >89 12/16/2014 1530   Lab Results  Component Value Date   HGBA1C 7.9 (H) 09/01/2017   HGBA1C 5.3 04/01/2017   HGBA1C 5.8 (H) 09/05/2015   HGBA1C 6.8 (H) 11/21/2014   HGBA1C 6.3 (H) 11/07/2014   No results found for: INSULIN CBC    Component Value Date/Time   WBC 3.1 (L) 03/29/2019 1143   RBC 3.69 (L) 03/29/2019 1143   HGB 11.0 (L) 03/29/2019 1143   HGB 10.2 (L) 03/27/2018 1324   HGB 12.4 07/29/2017 1444   HCT 36.0 03/29/2019 1143   HCT 38.0 07/29/2017 1444   PLT 210 03/29/2019 1143   PLT 268 03/27/2018 1324   PLT 260 07/29/2017 1444    MCV 97.6 03/29/2019 1143   MCV 90.9 07/29/2017 1444   MCH 29.8 03/29/2019 1143   MCHC 30.6 03/29/2019 1143   RDW 15.1 03/29/2019 1143   RDW 15.5 (H) 07/29/2017 1444   LYMPHSABS 0.7 02/15/2019 1312   LYMPHSABS 0.8 (L) 07/29/2017 1444   MONOABS 0.7 02/15/2019 1312   MONOABS 1.0 (H) 07/29/2017 1444   EOSABS 0.2 02/15/2019 1312   EOSABS 0.0 07/29/2017 1444   BASOSABS 0.0 02/15/2019 1312   BASOSABS 0.0 07/29/2017 1444   Iron/TIBC/Ferritin/ %Sat    Component Value Date/Time   IRON 7 (L) 08/31/2017 0451   IRON 12 (L) 11/29/2014 1251   TIBC 164 (L) 08/31/2017 0451   TIBC 331 11/29/2014 1251   FERRITIN 27 11/29/2014 1251   IRONPCTSAT 4 (L) 08/31/2017 0451   IRONPCTSAT 4 (L) 11/29/2014 1251   Lipid Panel     Component Value Date/Time  CHOL 109 08/28/2015 1617   TRIG 89 04/18/2017 0427   HDL 43.80 08/28/2015 1617   CHOLHDL 2 08/28/2015 1617   VLDL 19.8 08/28/2015 1617   LDLCALC 45 08/28/2015 1617   Hepatic Function Panel     Component Value Date/Time   PROT 7.4 01/17/2019 0351   PROT 6.9 01/24/2017 1228   ALBUMIN 2.4 (L) 01/22/2019 0343   ALBUMIN 3.4 (L) 01/24/2017 1228   AST 24 01/17/2019 0351   AST 14 (L) 03/27/2018 1324   AST 16 01/24/2017 1228   ALT 53 (H) 01/17/2019 0351   ALT 18 03/27/2018 1324   ALT 14 01/24/2017 1228   ALKPHOS 305 (H) 01/17/2019 0351   ALKPHOS 90 01/24/2017 1228   BILITOT 0.6 01/17/2019 0351   BILITOT 0.2 (L) 03/27/2018 1324   BILITOT 0.34 01/24/2017 1228      Component Value Date/Time   TSH 2.408 08/30/2017 0623   TSH 0.63 08/28/2015 1617      I, Trixie Dredge, am acting as transcriptionist for Dennard Nip, MD I have reviewed the above documentation for accuracy and completeness, and I agree with the above. -Dennard Nip, MD

## 2019-05-24 NOTE — Telephone Encounter (Signed)
lyrica sent in and fentanyl at 37.5

## 2019-05-27 ENCOUNTER — Encounter: Payer: Self-pay | Admitting: Hematology and Oncology

## 2019-05-28 ENCOUNTER — Telehealth: Payer: Self-pay | Admitting: Family Medicine

## 2019-05-28 DIAGNOSIS — L89309 Pressure ulcer of unspecified buttock, unspecified stage: Secondary | ICD-10-CM | POA: Diagnosis not present

## 2019-05-28 DIAGNOSIS — E785 Hyperlipidemia, unspecified: Secondary | ICD-10-CM | POA: Diagnosis not present

## 2019-05-28 DIAGNOSIS — Z936 Other artificial openings of urinary tract status: Secondary | ICD-10-CM | POA: Diagnosis not present

## 2019-05-28 DIAGNOSIS — N189 Chronic kidney disease, unspecified: Secondary | ICD-10-CM | POA: Diagnosis not present

## 2019-05-28 DIAGNOSIS — Z48815 Encounter for surgical aftercare following surgery on the digestive system: Secondary | ICD-10-CM | POA: Diagnosis not present

## 2019-05-28 DIAGNOSIS — Z452 Encounter for adjustment and management of vascular access device: Secondary | ICD-10-CM | POA: Diagnosis not present

## 2019-05-28 DIAGNOSIS — E039 Hypothyroidism, unspecified: Secondary | ICD-10-CM | POA: Diagnosis not present

## 2019-05-28 DIAGNOSIS — Z436 Encounter for attention to other artificial openings of urinary tract: Secondary | ICD-10-CM | POA: Diagnosis not present

## 2019-05-28 DIAGNOSIS — Z433 Encounter for attention to colostomy: Secondary | ICD-10-CM | POA: Diagnosis not present

## 2019-05-28 DIAGNOSIS — E860 Dehydration: Secondary | ICD-10-CM | POA: Diagnosis not present

## 2019-05-28 DIAGNOSIS — L89319 Pressure ulcer of right buttock, unspecified stage: Secondary | ICD-10-CM | POA: Diagnosis not present

## 2019-05-28 DIAGNOSIS — E1122 Type 2 diabetes mellitus with diabetic chronic kidney disease: Secondary | ICD-10-CM | POA: Diagnosis not present

## 2019-05-28 DIAGNOSIS — Z4801 Encounter for change or removal of surgical wound dressing: Secondary | ICD-10-CM | POA: Diagnosis not present

## 2019-05-28 DIAGNOSIS — K604 Rectal fistula: Secondary | ICD-10-CM | POA: Diagnosis not present

## 2019-05-28 DIAGNOSIS — Z48816 Encounter for surgical aftercare following surgery on the genitourinary system: Secondary | ICD-10-CM | POA: Diagnosis not present

## 2019-05-28 DIAGNOSIS — E1140 Type 2 diabetes mellitus with diabetic neuropathy, unspecified: Secondary | ICD-10-CM | POA: Diagnosis not present

## 2019-05-28 DIAGNOSIS — Z933 Colostomy status: Secondary | ICD-10-CM | POA: Diagnosis not present

## 2019-05-28 DIAGNOSIS — D62 Acute posthemorrhagic anemia: Secondary | ICD-10-CM | POA: Diagnosis not present

## 2019-05-28 DIAGNOSIS — N321 Vesicointestinal fistula: Secondary | ICD-10-CM | POA: Diagnosis not present

## 2019-05-28 DIAGNOSIS — K219 Gastro-esophageal reflux disease without esophagitis: Secondary | ICD-10-CM | POA: Diagnosis not present

## 2019-05-28 NOTE — Telephone Encounter (Signed)
[name] calling from [place] calling regarding verbal that where faxed on 04/29/2019.Orders where faxed and heard no response.  Orders have been completed.  Requested PT for Once a week for 4 weeks.  Please advise CB- [phone]

## 2019-05-29 NOTE — Telephone Encounter (Signed)
Verbal orders confirmed 

## 2019-05-30 DIAGNOSIS — E785 Hyperlipidemia, unspecified: Secondary | ICD-10-CM | POA: Diagnosis not present

## 2019-05-30 DIAGNOSIS — N1330 Unspecified hydronephrosis: Secondary | ICD-10-CM | POA: Diagnosis not present

## 2019-05-30 DIAGNOSIS — Z933 Colostomy status: Secondary | ICD-10-CM | POA: Diagnosis not present

## 2019-05-30 DIAGNOSIS — N321 Vesicointestinal fistula: Secondary | ICD-10-CM | POA: Diagnosis not present

## 2019-05-30 DIAGNOSIS — E860 Dehydration: Secondary | ICD-10-CM | POA: Diagnosis not present

## 2019-05-30 DIAGNOSIS — L89309 Pressure ulcer of unspecified buttock, unspecified stage: Secondary | ICD-10-CM | POA: Diagnosis not present

## 2019-05-31 DIAGNOSIS — N1831 Chronic kidney disease, stage 3a: Secondary | ICD-10-CM | POA: Diagnosis not present

## 2019-05-31 DIAGNOSIS — E861 Hypovolemia: Secondary | ICD-10-CM | POA: Diagnosis not present

## 2019-05-31 DIAGNOSIS — D709 Neutropenia, unspecified: Secondary | ICD-10-CM | POA: Diagnosis not present

## 2019-06-01 DIAGNOSIS — Z936 Other artificial openings of urinary tract status: Secondary | ICD-10-CM | POA: Diagnosis not present

## 2019-06-01 DIAGNOSIS — Z4801 Encounter for change or removal of surgical wound dressing: Secondary | ICD-10-CM | POA: Diagnosis not present

## 2019-06-01 DIAGNOSIS — E785 Hyperlipidemia, unspecified: Secondary | ICD-10-CM | POA: Diagnosis not present

## 2019-06-01 DIAGNOSIS — L89319 Pressure ulcer of right buttock, unspecified stage: Secondary | ICD-10-CM | POA: Diagnosis not present

## 2019-06-01 DIAGNOSIS — L89309 Pressure ulcer of unspecified buttock, unspecified stage: Secondary | ICD-10-CM | POA: Diagnosis not present

## 2019-06-01 DIAGNOSIS — Z933 Colostomy status: Secondary | ICD-10-CM | POA: Diagnosis not present

## 2019-06-01 DIAGNOSIS — E860 Dehydration: Secondary | ICD-10-CM | POA: Diagnosis not present

## 2019-06-04 DIAGNOSIS — E785 Hyperlipidemia, unspecified: Secondary | ICD-10-CM | POA: Diagnosis not present

## 2019-06-04 DIAGNOSIS — E860 Dehydration: Secondary | ICD-10-CM | POA: Diagnosis not present

## 2019-06-04 DIAGNOSIS — L89309 Pressure ulcer of unspecified buttock, unspecified stage: Secondary | ICD-10-CM | POA: Diagnosis not present

## 2019-06-05 DIAGNOSIS — Z48816 Encounter for surgical aftercare following surgery on the genitourinary system: Secondary | ICD-10-CM | POA: Diagnosis not present

## 2019-06-05 DIAGNOSIS — E1122 Type 2 diabetes mellitus with diabetic chronic kidney disease: Secondary | ICD-10-CM | POA: Diagnosis not present

## 2019-06-05 DIAGNOSIS — Z48815 Encounter for surgical aftercare following surgery on the digestive system: Secondary | ICD-10-CM | POA: Diagnosis not present

## 2019-06-05 DIAGNOSIS — E1140 Type 2 diabetes mellitus with diabetic neuropathy, unspecified: Secondary | ICD-10-CM | POA: Diagnosis not present

## 2019-06-05 DIAGNOSIS — D62 Acute posthemorrhagic anemia: Secondary | ICD-10-CM | POA: Diagnosis not present

## 2019-06-05 DIAGNOSIS — N321 Vesicointestinal fistula: Secondary | ICD-10-CM | POA: Diagnosis not present

## 2019-06-05 DIAGNOSIS — Z433 Encounter for attention to colostomy: Secondary | ICD-10-CM | POA: Diagnosis not present

## 2019-06-05 DIAGNOSIS — E039 Hypothyroidism, unspecified: Secondary | ICD-10-CM | POA: Diagnosis not present

## 2019-06-05 DIAGNOSIS — Z452 Encounter for adjustment and management of vascular access device: Secondary | ICD-10-CM | POA: Diagnosis not present

## 2019-06-05 DIAGNOSIS — Z436 Encounter for attention to other artificial openings of urinary tract: Secondary | ICD-10-CM | POA: Diagnosis not present

## 2019-06-05 DIAGNOSIS — K604 Rectal fistula: Secondary | ICD-10-CM | POA: Diagnosis not present

## 2019-06-05 DIAGNOSIS — N189 Chronic kidney disease, unspecified: Secondary | ICD-10-CM | POA: Diagnosis not present

## 2019-06-05 DIAGNOSIS — K219 Gastro-esophageal reflux disease without esophagitis: Secondary | ICD-10-CM | POA: Diagnosis not present

## 2019-06-06 DIAGNOSIS — E785 Hyperlipidemia, unspecified: Secondary | ICD-10-CM | POA: Diagnosis not present

## 2019-06-06 DIAGNOSIS — L89309 Pressure ulcer of unspecified buttock, unspecified stage: Secondary | ICD-10-CM | POA: Diagnosis not present

## 2019-06-06 DIAGNOSIS — E860 Dehydration: Secondary | ICD-10-CM | POA: Diagnosis not present

## 2019-06-08 DIAGNOSIS — E785 Hyperlipidemia, unspecified: Secondary | ICD-10-CM | POA: Diagnosis not present

## 2019-06-08 DIAGNOSIS — E860 Dehydration: Secondary | ICD-10-CM | POA: Diagnosis not present

## 2019-06-08 DIAGNOSIS — L89309 Pressure ulcer of unspecified buttock, unspecified stage: Secondary | ICD-10-CM | POA: Diagnosis not present

## 2019-06-11 DIAGNOSIS — E1140 Type 2 diabetes mellitus with diabetic neuropathy, unspecified: Secondary | ICD-10-CM | POA: Diagnosis not present

## 2019-06-11 DIAGNOSIS — Z433 Encounter for attention to colostomy: Secondary | ICD-10-CM | POA: Diagnosis not present

## 2019-06-11 DIAGNOSIS — Z452 Encounter for adjustment and management of vascular access device: Secondary | ICD-10-CM | POA: Diagnosis not present

## 2019-06-11 DIAGNOSIS — Z48815 Encounter for surgical aftercare following surgery on the digestive system: Secondary | ICD-10-CM | POA: Diagnosis not present

## 2019-06-11 DIAGNOSIS — N321 Vesicointestinal fistula: Secondary | ICD-10-CM | POA: Diagnosis not present

## 2019-06-11 DIAGNOSIS — E860 Dehydration: Secondary | ICD-10-CM | POA: Diagnosis not present

## 2019-06-11 DIAGNOSIS — E039 Hypothyroidism, unspecified: Secondary | ICD-10-CM | POA: Diagnosis not present

## 2019-06-11 DIAGNOSIS — Z48816 Encounter for surgical aftercare following surgery on the genitourinary system: Secondary | ICD-10-CM | POA: Diagnosis not present

## 2019-06-11 DIAGNOSIS — K604 Rectal fistula: Secondary | ICD-10-CM | POA: Diagnosis not present

## 2019-06-11 DIAGNOSIS — L89309 Pressure ulcer of unspecified buttock, unspecified stage: Secondary | ICD-10-CM | POA: Diagnosis not present

## 2019-06-11 DIAGNOSIS — E785 Hyperlipidemia, unspecified: Secondary | ICD-10-CM | POA: Diagnosis not present

## 2019-06-11 DIAGNOSIS — K219 Gastro-esophageal reflux disease without esophagitis: Secondary | ICD-10-CM | POA: Diagnosis not present

## 2019-06-11 DIAGNOSIS — E1122 Type 2 diabetes mellitus with diabetic chronic kidney disease: Secondary | ICD-10-CM | POA: Diagnosis not present

## 2019-06-11 DIAGNOSIS — Z436 Encounter for attention to other artificial openings of urinary tract: Secondary | ICD-10-CM | POA: Diagnosis not present

## 2019-06-11 DIAGNOSIS — D62 Acute posthemorrhagic anemia: Secondary | ICD-10-CM | POA: Diagnosis not present

## 2019-06-11 DIAGNOSIS — N189 Chronic kidney disease, unspecified: Secondary | ICD-10-CM | POA: Diagnosis not present

## 2019-06-12 DIAGNOSIS — E039 Hypothyroidism, unspecified: Secondary | ICD-10-CM | POA: Diagnosis not present

## 2019-06-12 DIAGNOSIS — Z936 Other artificial openings of urinary tract status: Secondary | ICD-10-CM | POA: Diagnosis not present

## 2019-06-12 DIAGNOSIS — Z933 Colostomy status: Secondary | ICD-10-CM | POA: Diagnosis not present

## 2019-06-12 DIAGNOSIS — Z4801 Encounter for change or removal of surgical wound dressing: Secondary | ICD-10-CM | POA: Diagnosis not present

## 2019-06-12 DIAGNOSIS — E782 Mixed hyperlipidemia: Secondary | ICD-10-CM | POA: Diagnosis not present

## 2019-06-12 DIAGNOSIS — L89319 Pressure ulcer of right buttock, unspecified stage: Secondary | ICD-10-CM | POA: Diagnosis not present

## 2019-06-12 DIAGNOSIS — E119 Type 2 diabetes mellitus without complications: Secondary | ICD-10-CM | POA: Diagnosis not present

## 2019-06-12 IMAGING — XA IR EXCHANGE NEPHROSTOMY RIGHT
3 series · 12 of 16 positions shown · non-contrast
Comparison: None.

INDICATION: History of endometrial cancer with chronic bilateral nephrostomy
catheters. Patient presents today for routine fluoroscopic guided
exchange.

EXAM:
FLUOROSCOPIC GUIDED BILATERAL SIDED NEPHROSTOMY CATHETER EXCHANGE
TECHNIQUE: Informed written consent was obtained from the patient after a
discussion of the risks, benefits and alternatives to treatment.
Questions regarding the procedure were encouraged and answered. A
timeout was performed prior to the initiation of the procedure.

[Series 1: fl - angio · 3 of 34 frames shown (1 of 2)]
[frame 6/34]
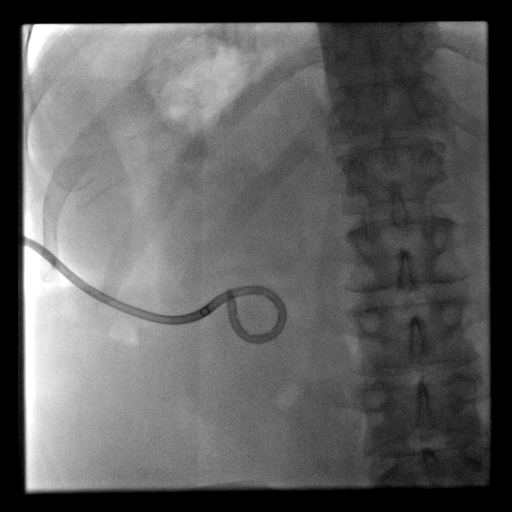
[frame 29/34]
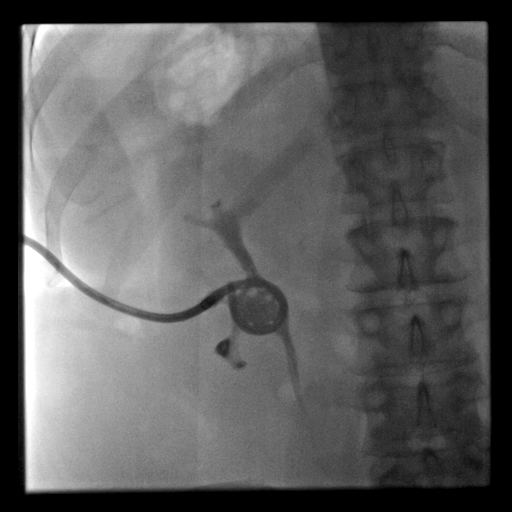
[frame 32/34]
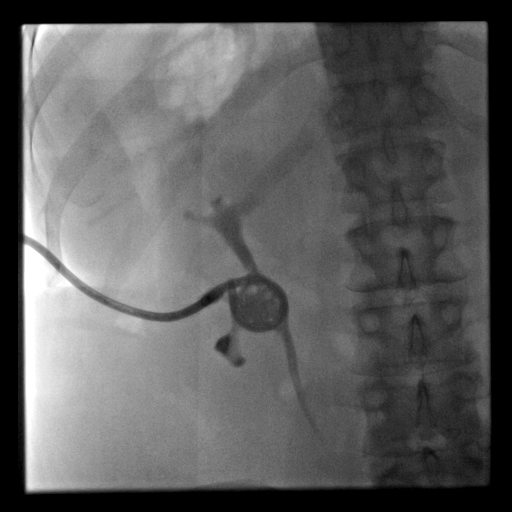

[Series 3: fl - angio · 3 of 22 frames shown (2 of 2)]
[frame 2/22]
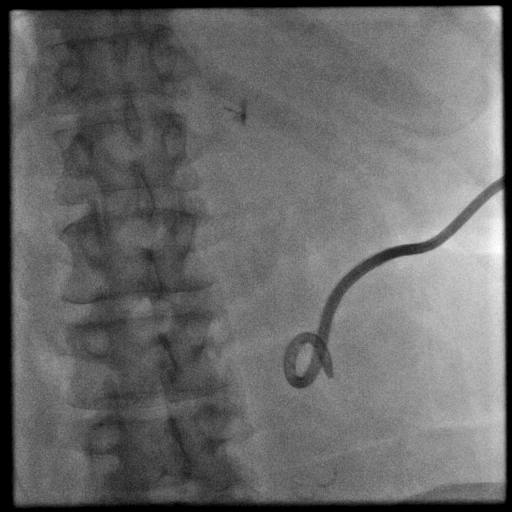
[frame 12/22]
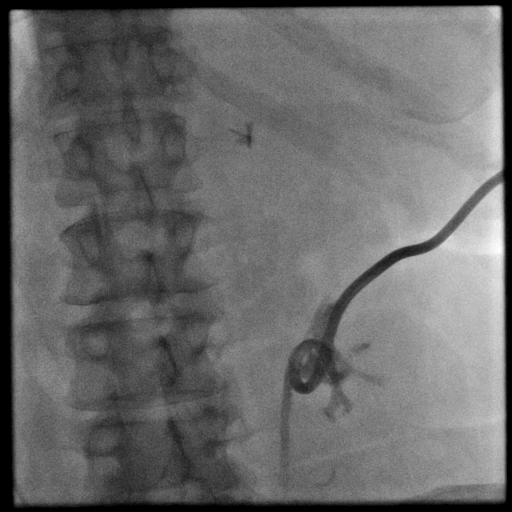
[frame 19/22]
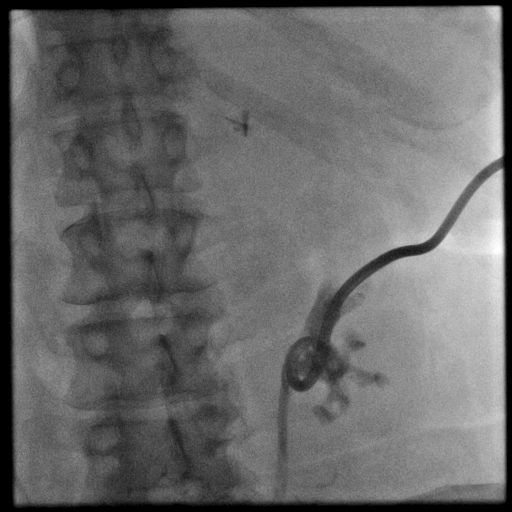

[Series 300: tube placements · 6 of 8 slices shown]
[im 1/8]
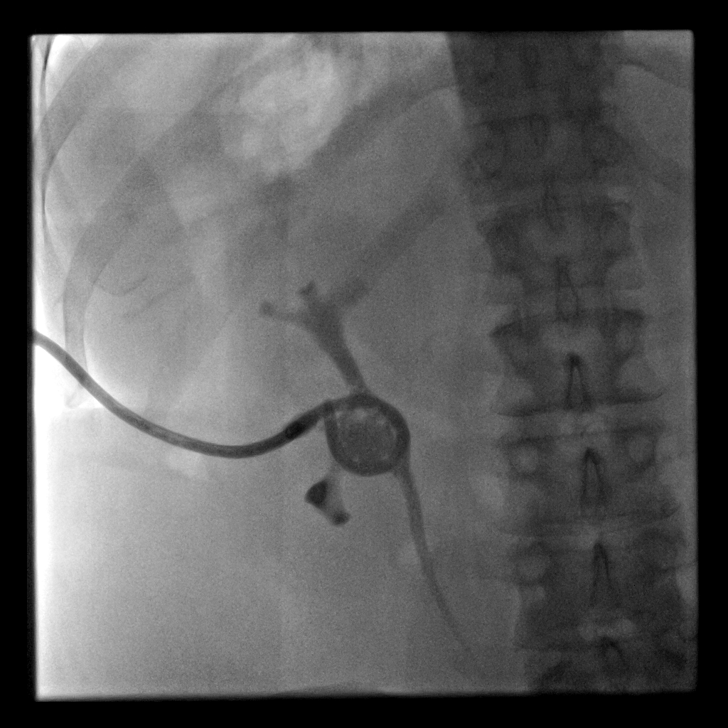
[im 3/8]
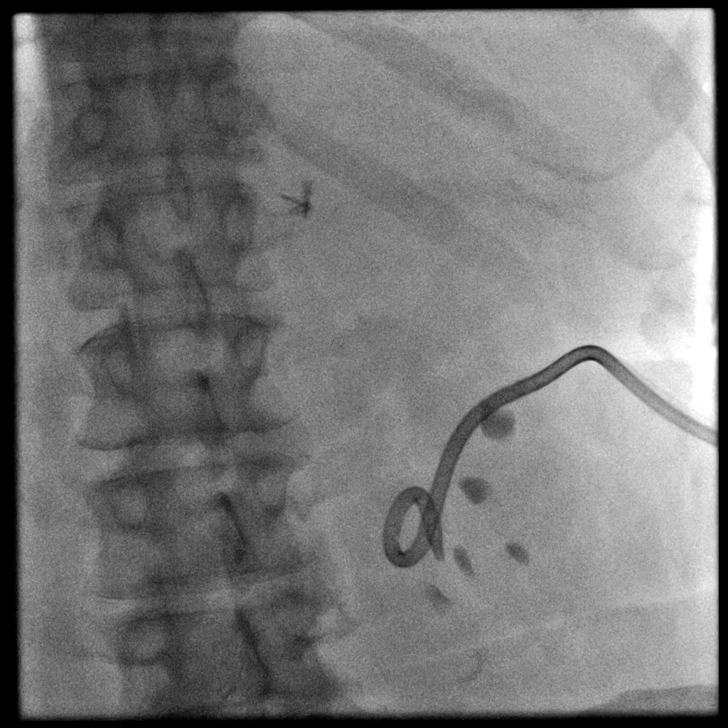
[im 4/8]
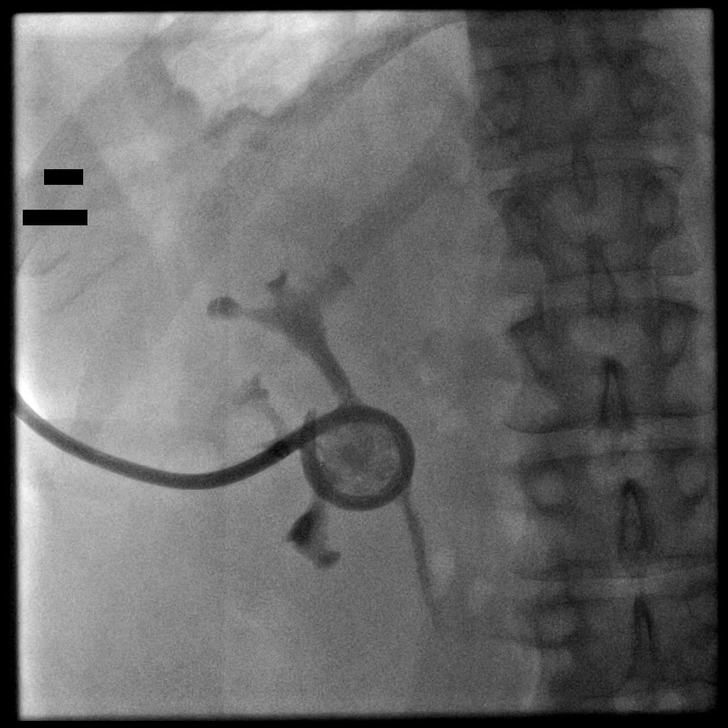
[im 5/8]
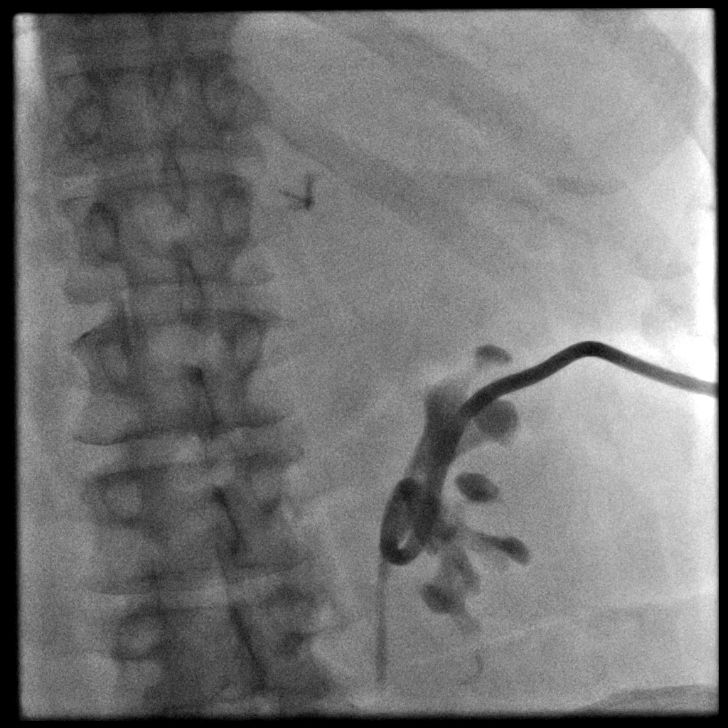
[im 7/8]
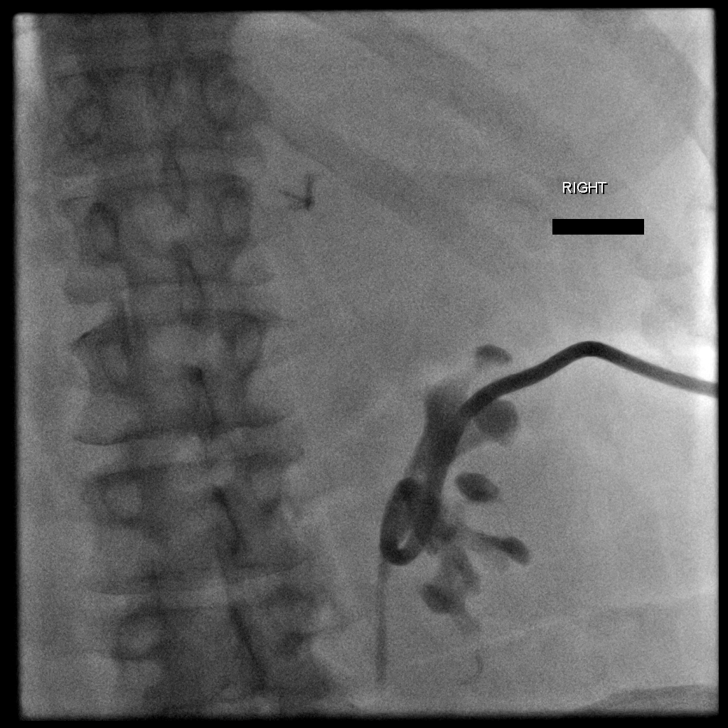
[im 8/8]
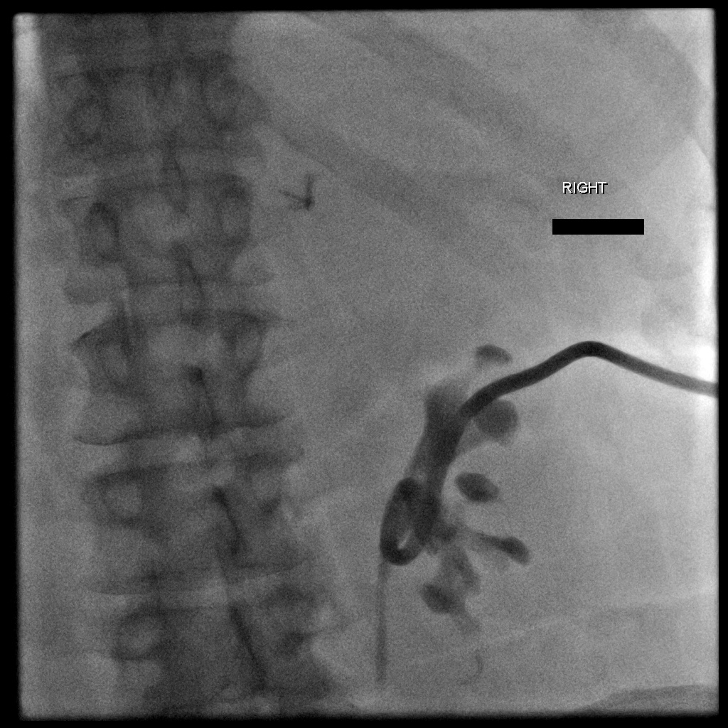

[12 of 16 positions shown; findings below may reference images not displayed]

CONTRAST:  A total of 20 mL Csovue-QLL administered was administered
into both collecting systems

FLUOROSCOPY TIME:  1 minute, 54 seconds (39 mGy)

COMPLICATIONS:
None immediate.
The bilateral flanks and external portions of existing nephrostomy
catheters were prepped and draped in the usual sterile fashion. A
sterile drape was applied covering the operative field. Maximum
barrier sterile technique with sterile gowns and gloves were used
for the procedure. A timeout was performed prior to the initiation
of the procedure.

A pre procedural spot fluoroscopic image was obtained. Beginning
with the left-sided nephrostomy, a small amount of contrast was
injected via the existing left-sided nephrostomy catheter
demonstrating appropriate positioning within the renal pelvis. The
existing nephrostomy catheter was cut and cannulated with a Benson
wire which was coiled within the renal pelvis. Under intermittent
fluoroscopic guidance, the existing nephrostomy catheter was
exchanged for a new 12 French all-purpose drainage catheter. Limited
contrast injection confirmed appropriate positioning within the left
renal pelvis and a post exchange fluoroscopic image was obtained.
The catheter was locked and reconnected to a gravity bag.

The identical repeat procedure was repeated for the contralateral
right-sided nephrostomy, ultimately allowing successful exchange of
a new 12 Auntyjatty Delowr drainage catheter with end coiled and
locked within the right renal pelvis.

Dressings were placed. The patient tolerated the above procedures
well without immediate postprocedural complication.
FINDINGS: The existing nephrostomy catheters are appropriately positioned and
functioning.

After successful fluoroscopic guided exchange, new bilateral 12
French nephrostomy catheters are coiled and locked within the
respective renal pelvises.
IMPRESSION: Successful fluoroscopic guided exchange of bilateral 12 French
percutaneous nephrostomy catheters. Again, the left-sided
nephrostomy catheter is a standard all-purpose drainage catheter and
the right-sided drainage catheter is Ceren Nur Peynir.

## 2019-06-13 DIAGNOSIS — E785 Hyperlipidemia, unspecified: Secondary | ICD-10-CM | POA: Diagnosis not present

## 2019-06-13 DIAGNOSIS — L89309 Pressure ulcer of unspecified buttock, unspecified stage: Secondary | ICD-10-CM | POA: Diagnosis not present

## 2019-06-13 DIAGNOSIS — E860 Dehydration: Secondary | ICD-10-CM | POA: Diagnosis not present

## 2019-06-14 ENCOUNTER — Encounter: Payer: Self-pay | Admitting: Family Medicine

## 2019-06-14 ENCOUNTER — Other Ambulatory Visit: Payer: Self-pay | Admitting: Family Medicine

## 2019-06-14 DIAGNOSIS — I129 Hypertensive chronic kidney disease with stage 1 through stage 4 chronic kidney disease, or unspecified chronic kidney disease: Secondary | ICD-10-CM | POA: Diagnosis not present

## 2019-06-14 DIAGNOSIS — E11319 Type 2 diabetes mellitus with unspecified diabetic retinopathy without macular edema: Secondary | ICD-10-CM | POA: Diagnosis not present

## 2019-06-14 DIAGNOSIS — N1832 Chronic kidney disease, stage 3b: Secondary | ICD-10-CM | POA: Diagnosis not present

## 2019-06-14 DIAGNOSIS — G893 Neoplasm related pain (acute) (chronic): Secondary | ICD-10-CM

## 2019-06-14 DIAGNOSIS — E1122 Type 2 diabetes mellitus with diabetic chronic kidney disease: Secondary | ICD-10-CM | POA: Diagnosis not present

## 2019-06-14 MED ORDER — FENTANYL 25 MCG/HR TD PT72: 1.0000 | MEDICATED_PATCH | TRANSDERMAL | 0 refills | Status: DC

## 2019-06-14 NOTE — Telephone Encounter (Signed)
I have sent 78mcg to the pharmacy

## 2019-06-15 DIAGNOSIS — Z933 Colostomy status: Secondary | ICD-10-CM | POA: Diagnosis not present

## 2019-06-15 DIAGNOSIS — E860 Dehydration: Secondary | ICD-10-CM | POA: Diagnosis not present

## 2019-06-15 DIAGNOSIS — Z4801 Encounter for change or removal of surgical wound dressing: Secondary | ICD-10-CM | POA: Diagnosis not present

## 2019-06-15 DIAGNOSIS — L89309 Pressure ulcer of unspecified buttock, unspecified stage: Secondary | ICD-10-CM | POA: Diagnosis not present

## 2019-06-15 DIAGNOSIS — E785 Hyperlipidemia, unspecified: Secondary | ICD-10-CM | POA: Diagnosis not present

## 2019-06-15 DIAGNOSIS — Z936 Other artificial openings of urinary tract status: Secondary | ICD-10-CM | POA: Diagnosis not present

## 2019-06-15 DIAGNOSIS — L89319 Pressure ulcer of right buttock, unspecified stage: Secondary | ICD-10-CM | POA: Diagnosis not present

## 2019-06-18 DIAGNOSIS — Z48816 Encounter for surgical aftercare following surgery on the genitourinary system: Secondary | ICD-10-CM | POA: Diagnosis not present

## 2019-06-18 DIAGNOSIS — N189 Chronic kidney disease, unspecified: Secondary | ICD-10-CM | POA: Diagnosis not present

## 2019-06-18 DIAGNOSIS — Z48815 Encounter for surgical aftercare following surgery on the digestive system: Secondary | ICD-10-CM | POA: Diagnosis not present

## 2019-06-18 DIAGNOSIS — L89309 Pressure ulcer of unspecified buttock, unspecified stage: Secondary | ICD-10-CM | POA: Diagnosis not present

## 2019-06-18 DIAGNOSIS — N321 Vesicointestinal fistula: Secondary | ICD-10-CM | POA: Diagnosis not present

## 2019-06-18 DIAGNOSIS — Z452 Encounter for adjustment and management of vascular access device: Secondary | ICD-10-CM | POA: Diagnosis not present

## 2019-06-18 DIAGNOSIS — E785 Hyperlipidemia, unspecified: Secondary | ICD-10-CM | POA: Diagnosis not present

## 2019-06-18 DIAGNOSIS — E860 Dehydration: Secondary | ICD-10-CM | POA: Diagnosis not present

## 2019-06-18 DIAGNOSIS — E1122 Type 2 diabetes mellitus with diabetic chronic kidney disease: Secondary | ICD-10-CM | POA: Diagnosis not present

## 2019-06-18 DIAGNOSIS — K219 Gastro-esophageal reflux disease without esophagitis: Secondary | ICD-10-CM | POA: Diagnosis not present

## 2019-06-18 DIAGNOSIS — E039 Hypothyroidism, unspecified: Secondary | ICD-10-CM | POA: Diagnosis not present

## 2019-06-18 DIAGNOSIS — K604 Rectal fistula: Secondary | ICD-10-CM | POA: Diagnosis not present

## 2019-06-18 DIAGNOSIS — Z433 Encounter for attention to colostomy: Secondary | ICD-10-CM | POA: Diagnosis not present

## 2019-06-18 DIAGNOSIS — Z436 Encounter for attention to other artificial openings of urinary tract: Secondary | ICD-10-CM | POA: Diagnosis not present

## 2019-06-18 DIAGNOSIS — E1140 Type 2 diabetes mellitus with diabetic neuropathy, unspecified: Secondary | ICD-10-CM | POA: Diagnosis not present

## 2019-06-18 DIAGNOSIS — D62 Acute posthemorrhagic anemia: Secondary | ICD-10-CM | POA: Diagnosis not present

## 2019-06-20 ENCOUNTER — Encounter (INDEPENDENT_AMBULATORY_CARE_PROVIDER_SITE_OTHER): Payer: Self-pay | Admitting: Family Medicine

## 2019-06-20 ENCOUNTER — Other Ambulatory Visit: Payer: Self-pay

## 2019-06-20 ENCOUNTER — Telehealth (INDEPENDENT_AMBULATORY_CARE_PROVIDER_SITE_OTHER): Payer: BC Managed Care – PPO | Admitting: Family Medicine

## 2019-06-20 DIAGNOSIS — Z6834 Body mass index (BMI) 34.0-34.9, adult: Secondary | ICD-10-CM

## 2019-06-20 DIAGNOSIS — E785 Hyperlipidemia, unspecified: Secondary | ICD-10-CM | POA: Diagnosis not present

## 2019-06-20 DIAGNOSIS — R748 Abnormal levels of other serum enzymes: Secondary | ICD-10-CM

## 2019-06-20 DIAGNOSIS — E669 Obesity, unspecified: Secondary | ICD-10-CM | POA: Diagnosis not present

## 2019-06-20 DIAGNOSIS — L89309 Pressure ulcer of unspecified buttock, unspecified stage: Secondary | ICD-10-CM | POA: Diagnosis not present

## 2019-06-20 DIAGNOSIS — E860 Dehydration: Secondary | ICD-10-CM | POA: Diagnosis not present

## 2019-06-21 DIAGNOSIS — Z936 Other artificial openings of urinary tract status: Secondary | ICD-10-CM | POA: Diagnosis not present

## 2019-06-21 DIAGNOSIS — L89319 Pressure ulcer of right buttock, unspecified stage: Secondary | ICD-10-CM | POA: Diagnosis not present

## 2019-06-21 DIAGNOSIS — Z4801 Encounter for change or removal of surgical wound dressing: Secondary | ICD-10-CM | POA: Diagnosis not present

## 2019-06-21 DIAGNOSIS — Z933 Colostomy status: Secondary | ICD-10-CM | POA: Diagnosis not present

## 2019-06-22 DIAGNOSIS — K219 Gastro-esophageal reflux disease without esophagitis: Secondary | ICD-10-CM | POA: Diagnosis not present

## 2019-06-22 DIAGNOSIS — K604 Rectal fistula: Secondary | ICD-10-CM | POA: Diagnosis not present

## 2019-06-22 DIAGNOSIS — Z79899 Other long term (current) drug therapy: Secondary | ICD-10-CM | POA: Diagnosis not present

## 2019-06-22 DIAGNOSIS — N189 Chronic kidney disease, unspecified: Secondary | ICD-10-CM | POA: Diagnosis not present

## 2019-06-22 DIAGNOSIS — N321 Vesicointestinal fistula: Secondary | ICD-10-CM | POA: Diagnosis not present

## 2019-06-22 DIAGNOSIS — Z8542 Personal history of malignant neoplasm of other parts of uterus: Secondary | ICD-10-CM | POA: Diagnosis not present

## 2019-06-22 DIAGNOSIS — Z8543 Personal history of malignant neoplasm of ovary: Secondary | ICD-10-CM | POA: Diagnosis not present

## 2019-06-22 DIAGNOSIS — E039 Hypothyroidism, unspecified: Secondary | ICD-10-CM | POA: Diagnosis not present

## 2019-06-22 DIAGNOSIS — Z5181 Encounter for therapeutic drug level monitoring: Secondary | ICD-10-CM | POA: Diagnosis not present

## 2019-06-22 DIAGNOSIS — E860 Dehydration: Secondary | ICD-10-CM | POA: Diagnosis not present

## 2019-06-22 DIAGNOSIS — E1140 Type 2 diabetes mellitus with diabetic neuropathy, unspecified: Secondary | ICD-10-CM | POA: Diagnosis not present

## 2019-06-22 DIAGNOSIS — Z452 Encounter for adjustment and management of vascular access device: Secondary | ICD-10-CM | POA: Diagnosis not present

## 2019-06-22 DIAGNOSIS — E1122 Type 2 diabetes mellitus with diabetic chronic kidney disease: Secondary | ICD-10-CM | POA: Diagnosis not present

## 2019-06-22 DIAGNOSIS — Z9181 History of falling: Secondary | ICD-10-CM | POA: Diagnosis not present

## 2019-06-22 DIAGNOSIS — L89309 Pressure ulcer of unspecified buttock, unspecified stage: Secondary | ICD-10-CM | POA: Diagnosis not present

## 2019-06-22 DIAGNOSIS — E785 Hyperlipidemia, unspecified: Secondary | ICD-10-CM | POA: Diagnosis not present

## 2019-06-25 DIAGNOSIS — E039 Hypothyroidism, unspecified: Secondary | ICD-10-CM | POA: Diagnosis not present

## 2019-06-25 DIAGNOSIS — Z79899 Other long term (current) drug therapy: Secondary | ICD-10-CM | POA: Diagnosis not present

## 2019-06-25 DIAGNOSIS — Z936 Other artificial openings of urinary tract status: Secondary | ICD-10-CM | POA: Diagnosis not present

## 2019-06-25 DIAGNOSIS — Z8542 Personal history of malignant neoplasm of other parts of uterus: Secondary | ICD-10-CM | POA: Diagnosis not present

## 2019-06-25 DIAGNOSIS — Z8543 Personal history of malignant neoplasm of ovary: Secondary | ICD-10-CM | POA: Diagnosis not present

## 2019-06-25 DIAGNOSIS — Z4801 Encounter for change or removal of surgical wound dressing: Secondary | ICD-10-CM | POA: Diagnosis not present

## 2019-06-25 DIAGNOSIS — Z933 Colostomy status: Secondary | ICD-10-CM | POA: Diagnosis not present

## 2019-06-25 DIAGNOSIS — E860 Dehydration: Secondary | ICD-10-CM | POA: Diagnosis not present

## 2019-06-25 DIAGNOSIS — K604 Rectal fistula: Secondary | ICD-10-CM | POA: Diagnosis not present

## 2019-06-25 DIAGNOSIS — E1122 Type 2 diabetes mellitus with diabetic chronic kidney disease: Secondary | ICD-10-CM | POA: Diagnosis not present

## 2019-06-25 DIAGNOSIS — K219 Gastro-esophageal reflux disease without esophagitis: Secondary | ICD-10-CM | POA: Diagnosis not present

## 2019-06-25 DIAGNOSIS — E785 Hyperlipidemia, unspecified: Secondary | ICD-10-CM | POA: Diagnosis not present

## 2019-06-25 DIAGNOSIS — L89319 Pressure ulcer of right buttock, unspecified stage: Secondary | ICD-10-CM | POA: Diagnosis not present

## 2019-06-25 DIAGNOSIS — Z9181 History of falling: Secondary | ICD-10-CM | POA: Diagnosis not present

## 2019-06-25 DIAGNOSIS — N189 Chronic kidney disease, unspecified: Secondary | ICD-10-CM | POA: Diagnosis not present

## 2019-06-25 DIAGNOSIS — Z452 Encounter for adjustment and management of vascular access device: Secondary | ICD-10-CM | POA: Diagnosis not present

## 2019-06-25 DIAGNOSIS — E1140 Type 2 diabetes mellitus with diabetic neuropathy, unspecified: Secondary | ICD-10-CM | POA: Diagnosis not present

## 2019-06-25 DIAGNOSIS — N321 Vesicointestinal fistula: Secondary | ICD-10-CM | POA: Diagnosis not present

## 2019-06-25 DIAGNOSIS — Z5181 Encounter for therapeutic drug level monitoring: Secondary | ICD-10-CM | POA: Diagnosis not present

## 2019-06-25 DIAGNOSIS — L89309 Pressure ulcer of unspecified buttock, unspecified stage: Secondary | ICD-10-CM | POA: Diagnosis not present

## 2019-06-25 NOTE — Progress Notes (Signed)
Office: [phone]  /  Fax: [phone] TeleHealth Visit:  [name] has verbally consented to this TeleHealth visit today. The patient is located at home, the provider is located at the [employer] [facility] office. The participants in this visit include the listed provider and patient. [name] was unable to use realtime audiovisual technology today and the telehealth visit was conducted via telephone.   HPI:   Chief Complaint: OBESITY [name] is here to discuss her progress with her obesity treatment plan. She is on the Category 2 plan and is following her eating plan approximately 65-70 % of the time. She states she is exercising 0 minutes 0 times per week. [name] has been deviating from her plan and feels her protein intake has decreased. She notes increased hunger and has been trying to get back on track.  We were unable to weigh the patient today for this TeleHealth visit. She feels as if she has gained weight since her last visit. She has lost 18 lbs since starting treatment with [place].  Elevated Vitamin B12 [name] states her B12 level is elevated at 1200 when it was checked at [facility]. She is on biotin and prenatal vitamins, and she is worried that this could be a problem.  ASSESSMENT AND PLAN:  Elevated vitamin B12 level  Class 1 obesity with serious comorbidity and body mass index (BMI) of 34.0 to 34.9 in adult, unspecified obesity type  PLAN:  Elevated Vitamin B12 [name] was reassured that this is very unlikely to be a problem, but she can discontinue her prenatal vitamins if she would like. [name] agrees to follow up with our clinic in 3 weeks.  I spent > than 50% of the 25 minute visit on counseling as documented in the note.  Obesity [name] is currently in the action stage of change. As such, her goal is to continue with weight loss efforts She has agreed to follow the Category 2 plan  Meal plan was emailed to the patient. [name] has been instructed to work up to  a goal of 150 minutes of combined cardio and strengthening exercise per week for weight loss and overall health benefits. We discussed the following Behavioral Modification Strategies today: holiday eating strategies    [name] has agreed to follow up with our clinic in 3 weeks. She was informed of the importance of frequent follow up visits to maximize her success with intensive lifestyle modifications for her multiple health conditions.  ALLERGIES: Allergies  Allergen Reactions  . Penicillins Swelling    Facial swelling/childhood allergy Has patient had a PCN reaction causing immediate rash, facial/tongue/throat swelling, SOB or lightheadedness with hypotension: Yes Has patient had a PCN reaction causing severe rash involving mucus membranes or skin necrosis: Yes Has patient had a PCN reaction that required hospitalization yes Has patient had a PCN reaction occurring within the last 10 years: No If all of the above answers are "NO", then may proceed with Cephalosporin use.   . Cefaclor Rash    Ceclor  . Erythromycin Other (See Comments)    Gastritis, abd cramps  . Tape Rash    blisters  . Trimethoprim Rash  . Ultram [Tramadol] Hives  . Cephalosporins Rash  . Fluconazole Rash  . Oxycodone Other (See Comments)    " I just feel weird"  . Pectin Rash    Pectin ring for stoma  . Septra [Sulfamethoxazole-Trimethoprim] Rash  . Sulfa Antibiotics Rash    MEDICATIONS: Current Outpatient Medications on File Prior to Visit  Medication Sig Dispense Refill  . acetaminophen (TYLENOL) 325 MG tablet Take 2 tablets (650 mg total) by mouth every 6 (six) hours as needed. 30 tablet 1  . anastrozole (ARIMIDEX) 1 MG tablet Take 1 tablet (1 mg total) by mouth daily. 90 tablet 3  . Biotin 5 MG TABS Take 5 mg by mouth daily.     . Cholecalciferol (VITAMIN D3) 10000 UNITS capsule Take 10,000 Units by mouth every Sunday.     . clobetasol (OLUX) 0.05 % topical foam Apply topically 2 (two) times daily.     . diphenhydrAMINE (BENADRYL) 25 mg capsule Take 1 capsule (25 mg total) by mouth every 8 (eight) hours as needed for itching, allergies or sleep. 30 capsule 0  . fentaNYL (DURAGESIC) 25 MCG/HR Place 1 patch onto the skin every 3 (three) days. 10 patch 0  . fentaNYL 37.5 MCG/HR PT72 Place 1 patch onto the skin every 3 (three) days. 10 patch 0  . ferrous sulfate 325 (65 FE) MG tablet Take 1 tablet (325 mg total) by mouth at bedtime. 30 tablet 3  . filgrastim-sndz (ZARXIO) 480 MCG/0.8ML SOSY injection Inject 0.8 mLs (480 mcg total) into the skin as directed. Inject 480 mcg every 5 days 0.8 mL 99  . JANUVIA 50 MG tablet Take 50 mg by mouth daily.     [name] Kitchen levothyroxine (SYNTHROID, LEVOTHROID) 150 MCG tablet Take 1 tablet (150 mcg total) by mouth daily before breakfast. 30 tablet 1  . loratadine (CLARITIN) 10 MG tablet Take 10 mg by mouth daily.     . nitrofurantoin, macrocrystal-monohydrate, (MACROBID) 100 MG capsule Take 100 mg by mouth at bedtime.    [name] Kitchen omega-3 acid ethyl esters (LOVAZA) 1 G capsule Take 1 g by mouth 2 (two) times daily.     Vladimir Faster Glycol-Propyl Glycol (SYSTANE OP) Place 1 drop into both eyes daily as needed (dry eyes).     . pregabalin (LYRICA) 50 MG capsule Take 1 capsule (50 mg total) by mouth 2 (two) times daily. 60 capsule 2  . Prenatal Vit-Fe Fumarate-FA (PRENATAL VITAMIN PO) Take 1 capsule by mouth daily. Takes prenatal because there are no dyes in it    . rosuvastatin (CRESTOR) 10 MG tablet Take 10 mg by mouth every evening.     . Saccharomyces boulardii (FLORASTOR PO) Take 1 capsule by mouth daily.     No current facility-administered medications on file prior to visit.     PAST MEDICAL HISTORY: Past Medical History:  Diagnosis Date  . Adrenal adenoma, left 02/08/2016   CT: stable benign  . Anemia in neoplastic disease   . Back pain   . Benign essential HTN   . 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 Cancer of corpus uteri, except isthmus [facility])  oncologist-- dr Denman [name] and dr Alvy Bimler    10-15-2004  dx endometroid endometrial and ovarian cancer s/p  chemotheapy and surgery(TAH w/ BSO) :  recurrent 11-19-2014 post pelvic surgery and radiation 01-29-2015 to 03-10-2015  . Chronic idiopathic neutropenia (HCC)    presumed related to chemotherapy March 2006--- followed by dr Alvy Bimler (treatment w/ G-CSF injections  . Chronic nausea   . Chronic pain    perineal/ anal  area from bladder pad irritates skin , right flank pain  . CKD stage G2/A3, GFR 60-89 and albumin  creatinine ratio >300 mg/g    nephrologist-  dr Madelon Lips  . Colovesical fistula   . Diabetic retinopathy, background ([place])   . Difficult intravenous access    small veins--- hx PICC lines  . DM type 2 (diabetes mellitus, type 2) ([place])    monitored by dr Legrand Como altheimer  . Dysuria   . Environmental and seasonal allergies   . Fatty liver 02/08/2016   CT  . Generalized muscle weakness   . GERD (gastroesophageal reflux disease)   . Hiatal hernia   . History of abdominal abscess 04/16/2017   post surgery 04-01-2017  --- resolved 10/ 2018  . History of gastric polyp    2014  duodenum  . History of ileus 04/16/2017   resolved w/ no surgical intervention  . History of radiation therapy    01-29-2015 to 03-10-2015  pelvis 50.4Gy  . Hypothyroidism    monitored by dr Legrand Como altheimer  . IBS (irritable bowel syndrome)   . Ileostomy in place [facility]) 04/01/2017   created at same time colostomy takedown.  . Joint pain   . Leg edema   . Lower urinary tract symptoms (LUTS)    urge urinary  incontinence  . Mixed dyslipidemia   . Multiple thyroid nodules    Managed by Dr. Harlow Asa  . Nephrostomy status ([place])   . Palpitations   . Pelvic abscess in female 04/16/2017  . PONV (postoperative nausea and  vomiting)    "scopolamine patch works for me"  . Radiation-induced dermatitis    contact dermatitis , radiation completed, rash only on ankles now.  . SBO (small bowel obstruction) ([place]) 01/2019  . Seasonal allergies   . Ureteral stricture, right UROLOGIT-  DR [facility]   CHRONIC--  TREATMENT URETERAL STENT  . 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 Vitamin D deficiency   . Wears glasses     PAST SURGICAL HISTORY: Past Surgical History:  Procedure Laterality Date  . APPENDECTOMY    . biopsy thyroid nodules    . BREAST RECONSTRUCTION WITH PLACEMENT OF TISSUE EXPANDER AND FLEX HD (ACELLULAR HYDRATED DERMIS) Bilateral 12/11/2015   Procedure: BILATERAL BREAST RECONSTRUCTION WITH PLACEMENT OF TISSUE EXPANDERS;  Surgeon: Irene Limbo, MD;  Location: [place];  Service: Plastics;  Laterality: Bilateral;  . COLONOSCOPY WITH PROPOFOL N/A 08/21/2013   Procedure: COLONOSCOPY WITH PROPOFOL;  Surgeon: Cleotis Nipper, MD;  [facility];  Service: Endoscopy;  Laterality: N/A;  . COLOSTOMY TAKEDOWN N/A 12/04/2014   Procedure: LAPROSCOPIC LYSIS OF ADHESIONS, SPLENIC MOBILIZATION, RELOCATION OF COLOSTOMY, DEBRIDEMENT INITIAL COLOSTOMY SITE;  Surgeon: Michael [name], MD;  [facility];  Service: General;  Laterality: N/A;  . CYSTOGRAM N/A 06/01/2017   Procedure: CYSTOGRAM;  Surgeon: Alexis Frock, MD;  [facility];  Service: Urology;  Laterality: N/A;  . CYSTOSCOPY W/ RETROGRADES Right 11/21/2015   Procedure: CYSTOSCOPY WITH RETROGRADE PYELOGRAM;  Surgeon: Alexis Frock, MD;  [facility];  Service: Urology;  Laterality: Right;  . CYSTOSCOPY W/ URETERAL STENT PLACEMENT Right 11/21/2015   Procedure: CYSTOSCOPY WITH STENT REPLACEMENT;  Surgeon: Alexis Frock, MD;  [facility];  Service: Urology;  Laterality: Right;  . CYSTOSCOPY W/ URETERAL STENT PLACEMENT Right 03/10/2016   Procedure: CYSTOSCOPY WITH STENT REPLACEMENT;  Surgeon: Alexis Frock, MD;  Location[facility];  Service: Urology;  Laterality: Right;  . CYSTOSCOPY W/ URETERAL STENT PLACEMENT Right 06/30/2016   Procedure: CYSTOSCOPY WITH RETROGRADE PYELOGRAM/URETERAL STENT EXCHANGE;  Surgeon: Alexis Frock, MD;  Location: [facility];  Service: Urology;  Laterality: Right;  . CYSTOSCOPY W/ URETERAL STENT PLACEMENT N/A 06/01/2017   Procedure: CYSTOSCOPY WITH EXAM UNDER ANESTHESIA;  Surgeon: Alexis Frock, MD;  [facility];  Service: Urology;  Laterality: N/A;  . CYSTOSCOPY W/ URETERAL STENT PLACEMENT Right 08/17/2017   Procedure: CYSTOSCOPY WITH RETROGRADE PYELOGRAM/URETERAL STENT REMOVAL;  Surgeon: Alexis Frock, MD;  Location: [facility];  Service: Urology;  Laterality: Right;  . CYSTOSCOPY WITH RETROGRADE PYELOGRAM, URETEROSCOPY AND STENT PLACEMENT Right 03/20/2015   Procedure: CYSTOSCOPY WITH RETROGRADE PYELOGRAM, URETEROSCOPY WITH BALLOON DILATION AND STENT PLACEMENT ON RIGHT;  Surgeon: Alexis Frock, MD;  Location: [facility];  Service: Urology;  Laterality: Right;  . CYSTOSCOPY WITH RETROGRADE PYELOGRAM, URETEROSCOPY AND STENT PLACEMENT Right 05/02/2015   Procedure: CYSTOSCOPY WITH RIGHT RETROGRADE PYELOGRAM,  DIAGNOSTIC URETEROSCOPY AND STENT PULL ;  Surgeon: Alexis Frock, MD;  Location: [facility];  Service: Urology;  Laterality: Right;  . CYSTOSCOPY WITH RETROGRADE PYELOGRAM, URETEROSCOPY AND STENT PLACEMENT Right 09/05/2015   Procedure: CYSTOSCOPY WITH RETROGRADE PYELOGRAM,  AND STENT PLACEMENT;  Surgeon: Alexis Frock, MD;  [facility];  Service: Urology;  Laterality: Right;  . CYSTOSCOPY WITH RETROGRADE PYELOGRAM, URETEROSCOPY AND STENT PLACEMENT Right 04/01/2017   Procedure: CYSTOSCOPY WITH RETROGRADE PYELOGRAM, URETEROSCOPY AND STENT PLACEMENT;  Surgeon: Alexis Frock, MD;  [facility];  Service: Urology;  Laterality: Right;  . CYSTOSCOPY WITH STENT PLACEMENT Right 10/27/2016   Procedure: CYSTOSCOPY  WITH STENT CHANGE and right retrograde pyelogram;  Surgeon: Alexis Frock, MD;  Location: [facility];  Service: Urology;  Laterality: Right;  . EUS N/A 10/02/2014   Procedure: LOWER ENDOSCOPIC ULTRASOUND (EUS);  Surgeon: Arta Silence, MD;  Location: [facility];  Service: Endoscopy;  Laterality: N/A;  . EXCISION SOFT TISSUE MASS RIGHT FOREMAN  12-08-2006  . EYE SURGERY  as child   pytosis of eyelids repair  . INCISION AND DRAINAGE OF WOUND Bilateral 12/26/2015   Procedure: DEBRIDEMENT OF BILATERAL MASTECTOMY FLAPS;  Surgeon: Irene Limbo, MD;  Location: [place];  Service: Plastics;  Laterality: Bilateral;  . IR CV LINE INJECTION  05/31/2017  . IR FLUORO GUIDE CV LINE LEFT  05/31/2017  . IR FLUORO GUIDE CV LINE RIGHT  04/06/2017  . IR FLUORO GUIDE CV MIDLINE PICC RIGHT  05/30/2017  . IR NEPHROSTOGRAM LEFT INITIAL PLACEMENT  09/02/2017  . IR NEPHROSTOGRAM LEFT THRU EXISTING ACCESS  11/29/2017  . IR NEPHROSTOGRAM RIGHT INITIAL PLACEMENT  09/02/2017  . IR NEPHROSTOGRAM RIGHT THRU EXISTING ACCESS  09/13/2017  . IR NEPHROSTOGRAM RIGHT THRU EXISTING ACCESS  11/29/2017  . IR NEPHROSTOMY EXCHANGE LEFT  11/28/2017  . IR NEPHROSTOMY EXCHANGE LEFT  01/05/2018  . IR NEPHROSTOMY EXCHANGE LEFT  02/16/2018  . IR NEPHROSTOMY EXCHANGE LEFT  03/30/2018  . IR NEPHROSTOMY EXCHANGE LEFT  05/12/2018  . IR NEPHROSTOMY EXCHANGE LEFT  06/21/2018  . IR NEPHROSTOMY EXCHANGE LEFT  08/04/2018  . IR NEPHROSTOMY EXCHANGE LEFT  09/18/2018  . IR NEPHROSTOMY EXCHANGE LEFT  10/09/2018  . IR NEPHROSTOMY EXCHANGE LEFT  10/27/2018  . IR NEPHROSTOMY EXCHANGE LEFT  11/21/2018  . IR NEPHROSTOMY EXCHANGE LEFT  01/05/2019  . IR NEPHROSTOMY EXCHANGE LEFT  02/15/2019  . IR NEPHROSTOMY EXCHANGE LEFT  03/29/2019  . IR NEPHROSTOMY EXCHANGE RIGHT  10/02/2017  . IR NEPHROSTOMY EXCHANGE RIGHT  11/28/2017  . IR NEPHROSTOMY EXCHANGE RIGHT  01/05/2018  . IR NEPHROSTOMY EXCHANGE RIGHT  02/16/2018  . IR NEPHROSTOMY EXCHANGE RIGHT   03/30/2018  .  IR NEPHROSTOMY EXCHANGE RIGHT  05/12/2018  . IR NEPHROSTOMY EXCHANGE RIGHT  06/21/2018  . IR NEPHROSTOMY EXCHANGE RIGHT  08/04/2018  . IR NEPHROSTOMY EXCHANGE RIGHT  09/18/2018  . IR NEPHROSTOMY EXCHANGE RIGHT  10/27/2018  . IR NEPHROSTOMY EXCHANGE RIGHT  11/21/2018  . IR NEPHROSTOMY EXCHANGE RIGHT  01/05/2019  . IR NEPHROSTOMY EXCHANGE RIGHT  02/15/2019  . IR NEPHROSTOMY EXCHANGE RIGHT  03/29/2019  . IR NEPHROSTOMY PLACEMENT LEFT  10/02/2017  . IR RADIOLOGIST EVAL & MGMT  05/03/2017  . IR US GUIDE VASC ACCESS LEFT  05/31/2017  . IR US GUIDE VASC ACCESS RIGHT  04/06/2017  . IR US GUIDE VASC ACCESS RIGHT  05/30/2017  . LAPAROSCOPIC CHOLECYSTECTOMY  1990  . LIPOSUCTION WITH LIPOFILLING Bilateral 04/16/2016   Procedure: LIPOSUCTION WITH LIPOFILLING TO BILATERAL CHEST;  Surgeon: Irene Limbo, MD;  Location: [place];  Service: Plastics;  Laterality: Bilateral;  . MASTECTOMY W/ SENTINEL NODE BIOPSY Bilateral 12/11/2015   Procedure: RIGHT PROPHYLACTIC MASTECTOMY, LEFT TOTAL MASTECTOMY WITH LEFT AXILLARY SENTINEL LYMPH NODE BIOPSY;  Surgeon: Stark Klein, MD;  Location: [place];  Service: General;  Laterality: Bilateral;  . OSTOMY N/A 11/19/2014   Procedure: OSTOMY;  Surgeon: Michael [name], MD;  [facility];  Service: General;  Laterality: N/A;  . PROCTOSCOPY N/A 04/01/2017   Procedure: RIDGE PROCTOSCOPY;  Surgeon: Michael [name], MD;  [facility];  Service: General;  Laterality: N/A;  . REMOVAL OF BILATERAL TISSUE EXPANDERS WITH PLACEMENT OF BILATERAL BREAST IMPLANTS Bilateral 04/16/2016   Procedure: REMOVAL OF BILATERAL TISSUE EXPANDERS WITH PLACEMENT OF BILATERAL BREAST IMPLANTS;  Surgeon: Irene Limbo, MD;  Location: [place];  Service: Plastics;  Laterality: Bilateral;  . ROBOTIC ASSISTED LAP VAGINAL HYSTERECTOMY N/A 11/19/2014   Procedure: ROBOTIC LYSIS OF ADHESIONS, CONVERTED TO LAPAROTOMY RADICAL UPPER VAGINECTOMY,LOW ANTERIOR BOWEL RESECTION,  COLOSTOMY, BILATERAL URETERAL STENT PLACEMENT AND CYSTONOMY CLOSURE;  Surgeon: Everitt Amber, MD;  [facility];  Service: Gynecology;  Laterality: N/A;  . TISSUE EXPANDER FILLING Bilateral 12/26/2015   Procedure: EXPANSION OF BILATERAL CHEST TISSUE EXPANDERS (60 mL- Right; 75 mL- Left);  Surgeon: Irene Limbo, MD;  Location: [place];  Service: Plastics;  Laterality: Bilateral;  . TONSILLECTOMY    . TOTAL ABDOMINAL HYSTERECTOMY  March 2006   [facility]   and Bilateral Salpingoophorectomy/  staging for Ovarian cancer/  an  . XI ROBOTIC ASSISTED LOWER ANTERIOR RESECTION N/A 04/01/2017   Procedure: XI ROBOTIC VS LAPAROSCOPIC COLOSTOMY TAKEDOWN WITH LYSIS OF ADHESIONS.;  Surgeon: Michael [name], MD;  [facility];  Service: General;  Laterality: N/A;  ERAS PATHWAY    SOCIAL HISTORY: Social History   Tobacco Use  . Smoking status: Never Smoker  . Smokeless tobacco: Never Used  Substance Use Topics  . Alcohol use: Not Currently  . Drug use: No    FAMILY HISTORY: Family History  Problem Relation Age of Onset  . Cancer Mother 1       stomach ca  . Hypertension Mother   . Cancer Father 26       prostate ca  . Diabetes Father   . Heart disease Father        CABG  . Hypertension Father   . Hyperlipidemia Father   . Obesity Father   . Breast cancer Maternal Aunt        dx in her 56s  . Lymphoma Paternal Aunt   . Brain cancer Paternal Grandfather   . Ovarian cancer Other   .  Diabetes Sister   . Hypertension Brother y-10  . Heart disease Brother        CABG  . Diabetes Brother     ROS: Review of Systems  Constitutional: Negative for weight loss.    PHYSICAL EXAM: Pt in no acute distress  RECENT LABS AND TESTS: BMET    Component Value Date/Time   NA 138 03/29/2019 1143   NA 142 11/29/2018   NA 141 01/24/2017 1228   K 4.1 03/29/2019 1143   K 4.6 01/24/2017 1228   CL 105 03/29/2019 1143   CO2 24 03/29/2019 1143   CO2 29 01/24/2017 1228   GLUCOSE  111 (H) 03/29/2019 1143   GLUCOSE 84 01/24/2017 1228   BUN 28 (H) 03/29/2019 1143   BUN 22 (A) 11/29/2018   BUN 22.2 01/24/2017 1228   CREATININE 1.56 (H) 03/29/2019 1143   CREATININE 1.98 (H) 03/27/2018 1324   CREATININE 0.8 01/24/2017 1228   CALCIUM 9.4 03/29/2019 1143   CALCIUM 10.2 01/24/2017 1228   GFRNONAA 35 (L) 03/29/2019 1143   GFRNONAA 26 (L) 03/27/2018 1324   GFRNONAA 78 12/16/2014 1530   GFRAA 40 (L) 03/29/2019 1143   GFRAA 30 (L) 03/27/2018 1324   GFRAA >89 12/16/2014 1530   Lab Results  Component Value Date   HGBA1C 7.9 (H) 09/01/2017   HGBA1C 5.3 04/01/2017   HGBA1C 5.8 (H) 09/05/2015   HGBA1C 6.8 (H) 11/21/2014   HGBA1C 6.3 (H) 11/07/2014   No results found for: INSULIN CBC    Component Value Date/Time   WBC 3.1 (L) 03/29/2019 1143   RBC 3.69 (L) 03/29/2019 1143   HGB 11.0 (L) 03/29/2019 1143   HGB 10.2 (L) 03/27/2018 1324   HGB 12.4 07/29/2017 1444   HCT 36.0 03/29/2019 1143   HCT 38.0 07/29/2017 1444   PLT 210 03/29/2019 1143   PLT 268 03/27/2018 1324   PLT 260 07/29/2017 1444   MCV 97.6 03/29/2019 1143   MCV 90.9 07/29/2017 1444   MCH 29.8 03/29/2019 1143   MCHC 30.6 03/29/2019 1143   RDW 15.1 03/29/2019 1143   RDW 15.5 (H) 07/29/2017 1444   LYMPHSABS 0.7 02/15/2019 1312   LYMPHSABS 0.8 (L) 07/29/2017 1444   MONOABS 0.7 02/15/2019 1312   MONOABS 1.0 (H) 07/29/2017 1444   EOSABS 0.2 02/15/2019 1312   EOSABS 0.0 07/29/2017 1444   BASOSABS 0.0 02/15/2019 1312   BASOSABS 0.0 07/29/2017 1444   Iron/TIBC/Ferritin/ %Sat    Component Value Date/Time   IRON 7 (L) 08/31/2017 0451   IRON 12 (L) 11/29/2014 1251   TIBC 164 (L) 08/31/2017 0451   TIBC 331 11/29/2014 1251   FERRITIN 27 11/29/2014 1251   IRONPCTSAT 4 (L) 08/31/2017 0451   IRONPCTSAT 4 (L) 11/29/2014 1251   Lipid Panel     Component Value Date/Time   CHOL 109 08/28/2015 1617   TRIG 89 04/18/2017 0427   HDL 43.80 08/28/2015 1617   CHOLHDL 2 08/28/2015 1617   VLDL 19.8 08/28/2015  1617   LDLCALC 45 08/28/2015 1617   Hepatic Function Panel     Component Value Date/Time   PROT 7.4 01/17/2019 0351   PROT 6.9 01/24/2017 1228   ALBUMIN 2.4 (L) 01/22/2019 0343   ALBUMIN 3.4 (L) 01/24/2017 1228   AST 24 01/17/2019 0351   AST 14 (L) 03/27/2018 1324   AST 16 01/24/2017 1228   ALT 53 (H) 01/17/2019 0351   ALT 18 03/27/2018 1324   ALT 14 01/24/2017 1228   ALKPHOS 305 (  H) 01/17/2019 0351   ALKPHOS 90 01/24/2017 1228   BILITOT 0.6 01/17/2019 0351   BILITOT 0.2 (L) 03/27/2018 1324   BILITOT 0.34 01/24/2017 1228      Component Value Date/Time   TSH 2.408 08/30/2017 0623   TSH 0.63 08/28/2015 1617      I, Trixie Dredge, am acting as transcriptionist for Dennard Nip, MD I have reviewed the above documentation for accuracy and completeness, and I agree with the above. -Dennard Nip, MD

## 2019-06-27 DIAGNOSIS — E785 Hyperlipidemia, unspecified: Secondary | ICD-10-CM | POA: Diagnosis not present

## 2019-06-27 DIAGNOSIS — L89309 Pressure ulcer of unspecified buttock, unspecified stage: Secondary | ICD-10-CM | POA: Diagnosis not present

## 2019-06-27 DIAGNOSIS — E860 Dehydration: Secondary | ICD-10-CM | POA: Diagnosis not present

## 2019-06-29 DIAGNOSIS — L89309 Pressure ulcer of unspecified buttock, unspecified stage: Secondary | ICD-10-CM | POA: Diagnosis not present

## 2019-06-29 DIAGNOSIS — E785 Hyperlipidemia, unspecified: Secondary | ICD-10-CM | POA: Diagnosis not present

## 2019-06-29 DIAGNOSIS — E860 Dehydration: Secondary | ICD-10-CM | POA: Diagnosis not present

## 2019-07-02 DIAGNOSIS — E785 Hyperlipidemia, unspecified: Secondary | ICD-10-CM | POA: Diagnosis not present

## 2019-07-02 DIAGNOSIS — L89309 Pressure ulcer of unspecified buttock, unspecified stage: Secondary | ICD-10-CM | POA: Diagnosis not present

## 2019-07-02 DIAGNOSIS — E860 Dehydration: Secondary | ICD-10-CM | POA: Diagnosis not present

## 2019-07-03 DIAGNOSIS — Z452 Encounter for adjustment and management of vascular access device: Secondary | ICD-10-CM | POA: Diagnosis not present

## 2019-07-03 DIAGNOSIS — Z8542 Personal history of malignant neoplasm of other parts of uterus: Secondary | ICD-10-CM | POA: Diagnosis not present

## 2019-07-03 DIAGNOSIS — Z5181 Encounter for therapeutic drug level monitoring: Secondary | ICD-10-CM | POA: Diagnosis not present

## 2019-07-03 DIAGNOSIS — Z8543 Personal history of malignant neoplasm of ovary: Secondary | ICD-10-CM | POA: Diagnosis not present

## 2019-07-03 DIAGNOSIS — E039 Hypothyroidism, unspecified: Secondary | ICD-10-CM | POA: Diagnosis not present

## 2019-07-03 DIAGNOSIS — N189 Chronic kidney disease, unspecified: Secondary | ICD-10-CM | POA: Diagnosis not present

## 2019-07-03 DIAGNOSIS — K604 Rectal fistula: Secondary | ICD-10-CM | POA: Diagnosis not present

## 2019-07-03 DIAGNOSIS — K219 Gastro-esophageal reflux disease without esophagitis: Secondary | ICD-10-CM | POA: Diagnosis not present

## 2019-07-03 DIAGNOSIS — Z79899 Other long term (current) drug therapy: Secondary | ICD-10-CM | POA: Diagnosis not present

## 2019-07-03 DIAGNOSIS — Z9181 History of falling: Secondary | ICD-10-CM | POA: Diagnosis not present

## 2019-07-03 DIAGNOSIS — E1140 Type 2 diabetes mellitus with diabetic neuropathy, unspecified: Secondary | ICD-10-CM | POA: Diagnosis not present

## 2019-07-03 DIAGNOSIS — E1122 Type 2 diabetes mellitus with diabetic chronic kidney disease: Secondary | ICD-10-CM | POA: Diagnosis not present

## 2019-07-03 DIAGNOSIS — N321 Vesicointestinal fistula: Secondary | ICD-10-CM | POA: Diagnosis not present

## 2019-07-04 DIAGNOSIS — E860 Dehydration: Secondary | ICD-10-CM | POA: Diagnosis not present

## 2019-07-04 DIAGNOSIS — E785 Hyperlipidemia, unspecified: Secondary | ICD-10-CM | POA: Diagnosis not present

## 2019-07-04 DIAGNOSIS — L89309 Pressure ulcer of unspecified buttock, unspecified stage: Secondary | ICD-10-CM | POA: Diagnosis not present

## 2019-07-05 DIAGNOSIS — K604 Rectal fistula: Secondary | ICD-10-CM | POA: Diagnosis not present

## 2019-07-05 DIAGNOSIS — N321 Vesicointestinal fistula: Secondary | ICD-10-CM | POA: Diagnosis not present

## 2019-07-05 DIAGNOSIS — E860 Dehydration: Secondary | ICD-10-CM | POA: Diagnosis not present

## 2019-07-05 DIAGNOSIS — E039 Hypothyroidism, unspecified: Secondary | ICD-10-CM | POA: Diagnosis not present

## 2019-07-05 DIAGNOSIS — Z5181 Encounter for therapeutic drug level monitoring: Secondary | ICD-10-CM | POA: Diagnosis not present

## 2019-07-05 DIAGNOSIS — Z452 Encounter for adjustment and management of vascular access device: Secondary | ICD-10-CM | POA: Diagnosis not present

## 2019-07-05 DIAGNOSIS — K219 Gastro-esophageal reflux disease without esophagitis: Secondary | ICD-10-CM | POA: Diagnosis not present

## 2019-07-05 DIAGNOSIS — Z8543 Personal history of malignant neoplasm of ovary: Secondary | ICD-10-CM | POA: Diagnosis not present

## 2019-07-05 DIAGNOSIS — Z8542 Personal history of malignant neoplasm of other parts of uterus: Secondary | ICD-10-CM | POA: Diagnosis not present

## 2019-07-05 DIAGNOSIS — Z79899 Other long term (current) drug therapy: Secondary | ICD-10-CM | POA: Diagnosis not present

## 2019-07-05 DIAGNOSIS — Z9181 History of falling: Secondary | ICD-10-CM | POA: Diagnosis not present

## 2019-07-05 DIAGNOSIS — E1122 Type 2 diabetes mellitus with diabetic chronic kidney disease: Secondary | ICD-10-CM | POA: Diagnosis not present

## 2019-07-05 DIAGNOSIS — E785 Hyperlipidemia, unspecified: Secondary | ICD-10-CM | POA: Diagnosis not present

## 2019-07-05 DIAGNOSIS — N189 Chronic kidney disease, unspecified: Secondary | ICD-10-CM | POA: Diagnosis not present

## 2019-07-05 DIAGNOSIS — L89309 Pressure ulcer of unspecified buttock, unspecified stage: Secondary | ICD-10-CM | POA: Diagnosis not present

## 2019-07-05 DIAGNOSIS — E1140 Type 2 diabetes mellitus with diabetic neuropathy, unspecified: Secondary | ICD-10-CM | POA: Diagnosis not present

## 2019-07-06 DIAGNOSIS — L89309 Pressure ulcer of unspecified buttock, unspecified stage: Secondary | ICD-10-CM | POA: Diagnosis not present

## 2019-07-06 DIAGNOSIS — E785 Hyperlipidemia, unspecified: Secondary | ICD-10-CM | POA: Diagnosis not present

## 2019-07-06 DIAGNOSIS — E860 Dehydration: Secondary | ICD-10-CM | POA: Diagnosis not present

## 2019-07-09 DIAGNOSIS — Z8543 Personal history of malignant neoplasm of ovary: Secondary | ICD-10-CM | POA: Diagnosis not present

## 2019-07-09 DIAGNOSIS — E1122 Type 2 diabetes mellitus with diabetic chronic kidney disease: Secondary | ICD-10-CM | POA: Diagnosis not present

## 2019-07-09 DIAGNOSIS — Z8542 Personal history of malignant neoplasm of other parts of uterus: Secondary | ICD-10-CM | POA: Diagnosis not present

## 2019-07-09 DIAGNOSIS — Z5181 Encounter for therapeutic drug level monitoring: Secondary | ICD-10-CM | POA: Diagnosis not present

## 2019-07-09 DIAGNOSIS — E785 Hyperlipidemia, unspecified: Secondary | ICD-10-CM | POA: Diagnosis not present

## 2019-07-09 DIAGNOSIS — N189 Chronic kidney disease, unspecified: Secondary | ICD-10-CM | POA: Diagnosis not present

## 2019-07-09 DIAGNOSIS — Z9181 History of falling: Secondary | ICD-10-CM | POA: Diagnosis not present

## 2019-07-09 DIAGNOSIS — L89309 Pressure ulcer of unspecified buttock, unspecified stage: Secondary | ICD-10-CM | POA: Diagnosis not present

## 2019-07-09 DIAGNOSIS — E1140 Type 2 diabetes mellitus with diabetic neuropathy, unspecified: Secondary | ICD-10-CM | POA: Diagnosis not present

## 2019-07-09 DIAGNOSIS — Z79899 Other long term (current) drug therapy: Secondary | ICD-10-CM | POA: Diagnosis not present

## 2019-07-09 DIAGNOSIS — K219 Gastro-esophageal reflux disease without esophagitis: Secondary | ICD-10-CM | POA: Diagnosis not present

## 2019-07-09 DIAGNOSIS — E860 Dehydration: Secondary | ICD-10-CM | POA: Diagnosis not present

## 2019-07-09 DIAGNOSIS — Z452 Encounter for adjustment and management of vascular access device: Secondary | ICD-10-CM | POA: Diagnosis not present

## 2019-07-09 DIAGNOSIS — N321 Vesicointestinal fistula: Secondary | ICD-10-CM | POA: Diagnosis not present

## 2019-07-09 DIAGNOSIS — K604 Rectal fistula: Secondary | ICD-10-CM | POA: Diagnosis not present

## 2019-07-09 DIAGNOSIS — E039 Hypothyroidism, unspecified: Secondary | ICD-10-CM | POA: Diagnosis not present

## 2019-07-10 DIAGNOSIS — Z933 Colostomy status: Secondary | ICD-10-CM | POA: Diagnosis not present

## 2019-07-10 DIAGNOSIS — Z936 Other artificial openings of urinary tract status: Secondary | ICD-10-CM | POA: Diagnosis not present

## 2019-07-10 DIAGNOSIS — Z4801 Encounter for change or removal of surgical wound dressing: Secondary | ICD-10-CM | POA: Diagnosis not present

## 2019-07-10 DIAGNOSIS — L89319 Pressure ulcer of right buttock, unspecified stage: Secondary | ICD-10-CM | POA: Diagnosis not present

## 2019-07-11 ENCOUNTER — Other Ambulatory Visit: Payer: Self-pay

## 2019-07-11 ENCOUNTER — Encounter (INDEPENDENT_AMBULATORY_CARE_PROVIDER_SITE_OTHER): Payer: Self-pay | Admitting: Family Medicine

## 2019-07-11 ENCOUNTER — Telehealth (INDEPENDENT_AMBULATORY_CARE_PROVIDER_SITE_OTHER): Payer: BC Managed Care – PPO | Admitting: Family Medicine

## 2019-07-11 DIAGNOSIS — E860 Dehydration: Secondary | ICD-10-CM | POA: Diagnosis not present

## 2019-07-11 DIAGNOSIS — E119 Type 2 diabetes mellitus without complications: Secondary | ICD-10-CM

## 2019-07-11 DIAGNOSIS — E861 Hypovolemia: Secondary | ICD-10-CM | POA: Diagnosis not present

## 2019-07-11 DIAGNOSIS — N1831 Chronic kidney disease, stage 3a: Secondary | ICD-10-CM | POA: Diagnosis not present

## 2019-07-11 DIAGNOSIS — Z794 Long term (current) use of insulin: Secondary | ICD-10-CM

## 2019-07-11 DIAGNOSIS — E785 Hyperlipidemia, unspecified: Secondary | ICD-10-CM | POA: Diagnosis not present

## 2019-07-11 DIAGNOSIS — Z6836 Body mass index (BMI) 36.0-36.9, adult: Secondary | ICD-10-CM

## 2019-07-11 DIAGNOSIS — D709 Neutropenia, unspecified: Secondary | ICD-10-CM | POA: Diagnosis not present

## 2019-07-11 DIAGNOSIS — L89309 Pressure ulcer of unspecified buttock, unspecified stage: Secondary | ICD-10-CM | POA: Diagnosis not present

## 2019-07-11 DIAGNOSIS — Z936 Other artificial openings of urinary tract status: Secondary | ICD-10-CM | POA: Diagnosis not present

## 2019-07-11 NOTE — Progress Notes (Signed)
Office: [phone]  /  Fax: [phone] TeleHealth Visit:  [name] has verbally consented to this TeleHealth visit today. The patient is located at home, the provider is located at the [employer] [facility] office. The participants in this visit include the listed provider and patient. The visit was conducted today via FaceTime.  HPI:   Chief Complaint: OBESITY [name] is here to discuss her progress with her obesity treatment plan. She is keeping a food journal with 1300 calories and 75 grams of protein and is following her eating plan approximately 50-60% of the time. She states she is exercising 0 minutes 0 times per week. [name] feels she has maintained her weight well even over Thanksgiving. Hunger is controlled and she is mostly meeting her protein goals. Her ostomy bag is still filling with gas frequently, but she has an appointment with GI next week. She is not exercising much, but is trying to move around in her home more.  We were unable to weigh the patient today for this TeleHealth visit. She feels as if she has maintained her weight since her last visit. She has lost 18 lbs since starting treatment with [place].  Diabetes II [name] has a diagnosis of diabetes type II. [name] does not report checking her blood sugars. Last A1c was done at [facility] and was very well controlled at 5.3 on Januvia. She has done wonderfully with weight loss, healthy eating, and denies hypoglycemia.  ASSESSMENT AND PLAN:  Type 2 diabetes mellitus without complication, with long-term current use of insulin (HCC)  Class 2 severe obesity with serious comorbidity and body mass index (BMI) of 36.0 to 36.9 in adult, unspecified obesity type ([place])  PLAN:  Diabetes II [name] has been given diabetes education by myself today. Good blood sugar control is important to decrease the likelihood of diabetic complications such as nephropathy, neuropathy, limb loss, blindness, coronary artery disease, and death.  Intensive lifestyle modification including diet, exercise and weight loss were discussed as the first line treatment for diabetes. [name] will continue diet, exercise, and Januvia. We will continue to monitor.  I spent > than 50% of the 16 minute visit on counseling as documented in the note.   Obesity [name] is currently in the action stage of change. As such, her goal is to continue with weight loss efforts. She has agreed to keep a food journal with 1300 calories and 75 grams of protein.  [name] has been instructed to work up to a goal of 150 minutes of combined cardio and strengthening exercise per week for weight loss and overall health benefits. We discussed the following Behavioral Modification Strategies today: increase H20 intake, work on meal planning and easy cooking plans, and holiday eating strategies.  [name] has agreed to follow-up with our clinic in 2 weeks. She was informed of the importance of frequent follow-up visits to maximize her success with intensive lifestyle modifications for her multiple health conditions.  ALLERGIES: Allergies  Allergen Reactions   Penicillins Swelling    Facial swelling/childhood allergy Has patient had a PCN reaction causing immediate rash, facial/tongue/throat swelling, SOB or lightheadedness with hypotension: Yes Has patient had a PCN reaction causing severe rash involving mucus membranes or skin necrosis: Yes Has patient had a PCN reaction that required hospitalization yes Has patient had a PCN reaction occurring within the last 10 years: No If all of the above answers are "NO", then may proceed with Cephalosporin use.    Cefaclor Rash    Ceclor  Erythromycin Other (See Comments)    Gastritis, abd cramps   Tape Rash    blisters   Trimethoprim Rash   Ultram [Tramadol] Hives   Cephalosporins Rash   Fluconazole Rash   Oxycodone Other (See Comments)    " I just feel weird"   Pectin Rash    Pectin ring for stoma   Septra  [Sulfamethoxazole-Trimethoprim] Rash   Sulfa Antibiotics Rash    MEDICATIONS: Current Outpatient Medications on File Prior to Visit  Medication Sig Dispense Refill   acetaminophen (TYLENOL) 325 MG tablet Take 2 tablets (650 mg total) by mouth every 6 (six) hours as needed. 30 tablet 1   anastrozole (ARIMIDEX) 1 MG tablet Take 1 tablet (1 mg total) by mouth daily. 90 tablet 3   Biotin 5 MG TABS Take 5 mg by mouth daily.      Cholecalciferol (VITAMIN D3) 10000 UNITS capsule Take 10,000 Units by mouth every Sunday.      clobetasol (OLUX) 0.05 % topical foam Apply topically 2 (two) times daily.     diphenhydrAMINE (BENADRYL) 25 mg capsule Take 1 capsule (25 mg total) by mouth every 8 (eight) hours as needed for itching, allergies or sleep. 30 capsule 0   fentaNYL (DURAGESIC) 25 MCG/HR Place 1 patch onto the skin every 3 (three) days. 10 patch 0   ferrous sulfate 325 (65 FE) MG tablet Take 1 tablet (325 mg total) by mouth at bedtime. 30 tablet 3   filgrastim-sndz (ZARXIO) 480 MCG/0.8ML SOSY injection Inject 0.8 mLs (480 mcg total) into the skin as directed. Inject 480 mcg every 5 days 0.8 mL 99   JANUVIA 50 MG tablet Take 50 mg by mouth daily.      levothyroxine (SYNTHROID, LEVOTHROID) 150 MCG tablet Take 1 tablet (150 mcg total) by mouth daily before breakfast. 30 tablet 1   loratadine (CLARITIN) 10 MG tablet Take 10 mg by mouth daily.      nitrofurantoin, macrocrystal-monohydrate, (MACROBID) 100 MG capsule Take 100 mg by mouth at bedtime.     omega-3 acid ethyl esters (LOVAZA) 1 G capsule Take 1 g by mouth 2 (two) times daily.      Polyethyl Glycol-Propyl Glycol (SYSTANE OP) Place 1 drop into both eyes daily as needed (dry eyes).      pregabalin (LYRICA) 50 MG capsule Take 1 capsule (50 mg total) by mouth 2 (two) times daily. 60 capsule 2   Prenatal Vit-Fe Fumarate-FA (PRENATAL VITAMIN PO) Take 1 capsule by mouth daily. Takes prenatal because there are no dyes in it      rosuvastatin (CRESTOR) 10 MG tablet Take 10 mg by mouth every evening.      Saccharomyces boulardii (FLORASTOR PO) Take 1 capsule by mouth daily.     No current facility-administered medications on file prior to visit.     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+, HER2 negative/  12-11-2015 bilateral mastecotmy w/ reconstruction (no radiation and no chemo)   Cancer of corpus uteri, except [facility])  oncologist-- dr Denman [name] and dr Alvy Bimler    10-15-2004  dx endometroid endometrial and ovarian cancer s/p  chemotheapy and surgery(TAH w/ BSO) :  recurrent 11-19-2014 post  pelvic surgery and radiation 01-29-2015 to 03-10-2015   Chronic idiopathic neutropenia ([place])    presumed related to chemotherapy March 2006--- followed by dr Alvy Bimler (treatment w/ G-CSF injections   Chronic nausea    Chronic pain    perineal/ anal  area from bladder pad irritates skin , right flank pain   CKD stage G2/A3, GFR 60-89 and albumin creatinine ratio >300 mg/g    nephrologist-  dr Madelon Lips   Colovesical fistula    Diabetic retinopathy, background ([place])    Difficult intravenous access    small veins--- hx PICC lines   DM type 2 (diabetes mellitus, type 2) ([place])    monitored by dr Legrand Como altheimer   Dysuria    Environmental and seasonal allergies    Fatty liver 02/08/2016   CT   Generalized muscle weakness    GERD (gastroesophageal reflux disease)    Hiatal hernia    History of abdominal abscess 04/16/2017   post surgery 04-01-2017  --- resolved 10/ 2018   History of gastric polyp    2014  duodenum   History of ileus 04/16/2017   resolved w/ no surgical intervention   History of radiation therapy     01-29-2015 to 03-10-2015  pelvis 50.4Gy   Hypothyroidism    monitored by dr Legrand Como altheimer   IBS (irritable bowel syndrome)    Ileostomy in place ([place]) 04/01/2017   created at same time colostomy takedown.   Joint pain    Leg edema    Lower urinary tract symptoms (LUTS)    urge urinary  incontinence   Mixed dyslipidemia    Multiple thyroid nodules    Managed by Dr. Harlow Asa   Nephrostomy status [facility])    Palpitations    Pelvic abscess in female 04/16/2017   PONV (postoperative nausea and vomiting)    "scopolamine patch works for me"   Radiation-induced dermatitis    contact dermatitis , radiation completed, rash only on ankles now.   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   Vitamin D deficiency    Wears glasses     PAST SURGICAL HISTORY: Past Surgical History:  Procedure Laterality Date   APPENDECTOMY     biopsy thyroid nodules     BREAST RECONSTRUCTION WITH PLACEMENT OF TISSUE EXPANDER AND FLEX HD (ACELLULAR HYDRATED DERMIS) Bilateral 12/11/2015   Procedure: BILATERAL BREAST RECONSTRUCTION WITH PLACEMENT OF TISSUE EXPANDERS;  Surgeon: Irene Limbo, MD;  Location: [place];  Service: Plastics;  Laterality: Bilateral;   COLONOSCOPY WITH PROPOFOL N/A 08/21/2013   Procedure: COLONOSCOPY WITH PROPOFOL;  Surgeon: Cleotis Nipper, MD;  [facility];  Service: Endoscopy;  Laterality: N/A;   COLOSTOMY TAKEDOWN N/A 12/04/2014   Procedure: LAPROSCOPIC LYSIS OF ADHESIONS, SPLENIC MOBILIZATION, RELOCATION OF COLOSTOMY, DEBRIDEMENT INITIAL COLOSTOMY SITE;  Surgeon: Michael [name], MD;  [facility];  Service: General;  Laterality: N/A;   CYSTOGRAM N/A 06/01/2017   Procedure: CYSTOGRAM;  Surgeon: Alexis Frock, MD;  [facility];  Service: Urology;  Laterality: N/A;   CYSTOSCOPY W/  RETROGRADES Right 11/21/2015   Procedure: CYSTOSCOPY WITH RETROGRADE PYELOGRAM;  Surgeon: Alexis Frock, MD;  [facility];  Service: Urology;  Laterality: Right;   CYSTOSCOPY W/ URETERAL STENT PLACEMENT Right 11/21/2015   Procedure: CYSTOSCOPY WITH STENT REPLACEMENT;  Surgeon: Alexis Frock, MD;  [facility];  Service: Urology;  Laterality: Right;   CYSTOSCOPY W/ URETERAL STENT PLACEMENT Right 03/10/2016   Procedure: CYSTOSCOPY WITH STENT REPLACEMENT;  Surgeon: Alexis Frock, MD;  Location: [facility];  Service: Urology;  Laterality: Right;   CYSTOSCOPY W/ URETERAL STENT PLACEMENT Right 06/30/2016   Procedure: CYSTOSCOPY WITH RETROGRADE PYELOGRAM/URETERAL STENT EXCHANGE;  Surgeon: Alexis Frock, MD;  Location: [facility];  Service: Urology;  Laterality: Right;   CYSTOSCOPY W/ URETERAL STENT PLACEMENT N/A 06/01/2017   Procedure: CYSTOSCOPY WITH EXAM UNDER ANESTHESIA;  Surgeon: Alexis Frock, MD;  [facility];  Service: Urology;  Laterality: N/A;   CYSTOSCOPY W/ URETERAL STENT PLACEMENT Right 08/17/2017   Procedure: CYSTOSCOPY WITH RETROGRADE PYELOGRAM/URETERAL STENT REMOVAL;  Surgeon: Alexis Frock, MD;  Location: [facility];  Service: Urology;  Laterality: Right;   CYSTOSCOPY WITH RETROGRADE PYELOGRAM, URETEROSCOPY AND STENT PLACEMENT Right 03/20/2015   Procedure: CYSTOSCOPY WITH RETROGRADE PYELOGRAM, URETEROSCOPY WITH BALLOON DILATION AND STENT PLACEMENT ON RIGHT;  Surgeon: Alexis Frock, MD;  Location: [facility];  Service: Urology;  Laterality: Right;   CYSTOSCOPY WITH RETROGRADE PYELOGRAM, URETEROSCOPY AND STENT PLACEMENT Right 05/02/2015   Procedure: CYSTOSCOPY WITH RIGHT RETROGRADE PYELOGRAM,  DIAGNOSTIC URETEROSCOPY AND STENT PULL ;  Surgeon: Alexis Frock, MD;  Location: [facility];  Service: Urology;  Laterality: Right;   CYSTOSCOPY WITH RETROGRADE PYELOGRAM, URETEROSCOPY AND STENT  PLACEMENT Right 09/05/2015   Procedure: CYSTOSCOPY WITH RETROGRADE PYELOGRAM,  AND STENT PLACEMENT;  Surgeon: Alexis Frock, MD;  [facility];  Service: Urology;  Laterality: Right;   CYSTOSCOPY WITH RETROGRADE PYELOGRAM, URETEROSCOPY AND STENT PLACEMENT Right 04/01/2017   Procedure: CYSTOSCOPY WITH RETROGRADE PYELOGRAM, URETEROSCOPY AND STENT PLACEMENT;  Surgeon: Alexis Frock, MD;  [facility];  Service: Urology;  Laterality: Right;   CYSTOSCOPY WITH STENT PLACEMENT Right 10/27/2016   Procedure: CYSTOSCOPY WITH STENT CHANGE and right retrograde pyelogram;  Surgeon: Alexis Frock, MD;  Location[facility];  Service: Urology;  Laterality: Right;   EUS N/A 10/02/2014   Procedure: LOWER ENDOSCOPIC ULTRASOUND (EUS);  Surgeon: Arta Silence, MD;  Location: [facility];  Service: Endoscopy;  Laterality: N/A;   EXCISION SOFT TISSUE MASS RIGHT FOREMAN  12-08-2006   EYE SURGERY  as child   pytosis of eyelids repair   INCISION AND DRAINAGE OF WOUND Bilateral 12/26/2015   Procedure: DEBRIDEMENT OF BILATERAL MASTECTOMY FLAPS;  Surgeon: Irene Limbo, MD;  Location: [place];  Service: Plastics;  Laterality: Bilateral;   IR CV LINE INJECTION  05/31/2017   IR FLUORO GUIDE CV LINE LEFT  05/31/2017   IR FLUORO GUIDE CV LINE RIGHT  04/06/2017   IR FLUORO GUIDE CV MIDLINE PICC RIGHT  05/30/2017   IR NEPHROSTOGRAM LEFT INITIAL PLACEMENT  09/02/2017   IR NEPHROSTOGRAM LEFT THRU EXISTING ACCESS  11/29/2017   IR NEPHROSTOGRAM RIGHT INITIAL PLACEMENT  09/02/2017   IR NEPHROSTOGRAM RIGHT THRU EXISTING ACCESS  09/13/2017   IR NEPHROSTOGRAM RIGHT THRU EXISTING ACCESS  11/29/2017   IR NEPHROSTOMY EXCHANGE LEFT  11/28/2017   IR NEPHROSTOMY EXCHANGE LEFT  01/05/2018   IR NEPHROSTOMY EXCHANGE LEFT  02/16/2018   IR NEPHROSTOMY EXCHANGE LEFT  03/30/2018   IR NEPHROSTOMY EXCHANGE LEFT  05/12/2018   IR NEPHROSTOMY EXCHANGE LEFT  06/21/2018   IR NEPHROSTOMY EXCHANGE  LEFT  08/04/2018   IR NEPHROSTOMY EXCHANGE LEFT  09/18/2018   IR NEPHROSTOMY EXCHANGE LEFT  10/09/2018   IR NEPHROSTOMY EXCHANGE LEFT  10/27/2018   IR NEPHROSTOMY EXCHANGE LEFT  11/21/2018  IR NEPHROSTOMY EXCHANGE LEFT  01/05/2019   IR NEPHROSTOMY EXCHANGE LEFT  02/15/2019   IR NEPHROSTOMY EXCHANGE LEFT  03/29/2019   IR NEPHROSTOMY EXCHANGE RIGHT  10/02/2017   IR NEPHROSTOMY EXCHANGE RIGHT  11/28/2017   IR NEPHROSTOMY EXCHANGE RIGHT  01/05/2018   IR NEPHROSTOMY EXCHANGE RIGHT  02/16/2018   IR NEPHROSTOMY EXCHANGE RIGHT  03/30/2018   IR NEPHROSTOMY EXCHANGE RIGHT  05/12/2018   IR NEPHROSTOMY EXCHANGE RIGHT  06/21/2018   IR NEPHROSTOMY EXCHANGE RIGHT  08/04/2018   IR NEPHROSTOMY EXCHANGE RIGHT  09/18/2018   IR NEPHROSTOMY EXCHANGE RIGHT  10/27/2018   IR NEPHROSTOMY EXCHANGE RIGHT  11/21/2018   IR NEPHROSTOMY EXCHANGE RIGHT  01/05/2019   IR NEPHROSTOMY EXCHANGE RIGHT  02/15/2019   IR NEPHROSTOMY EXCHANGE RIGHT  03/29/2019   IR NEPHROSTOMY PLACEMENT LEFT  10/02/2017   IR RADIOLOGIST EVAL & MGMT  05/03/2017   IR US GUIDE VASC ACCESS LEFT  05/31/2017   IR US GUIDE VASC ACCESS RIGHT  04/06/2017   IR US GUIDE VASC ACCESS RIGHT  05/30/2017   LAPAROSCOPIC CHOLECYSTECTOMY  1990   LIPOSUCTION WITH LIPOFILLING Bilateral 04/16/2016   Procedure: LIPOSUCTION WITH LIPOFILLING TO BILATERAL CHEST;  Surgeon: Irene Limbo, MD;  Location: [place];  Service: Plastics;  Laterality: Bilateral;   MASTECTOMY W/ SENTINEL NODE BIOPSY Bilateral 12/11/2015   Procedure: RIGHT PROPHYLACTIC MASTECTOMY, LEFT TOTAL MASTECTOMY WITH LEFT AXILLARY SENTINEL LYMPH NODE BIOPSY;  Surgeon: Stark Klein, MD;  Location: [place];  Service: General;  Laterality: Bilateral;   OSTOMY N/A 11/19/2014   Procedure: OSTOMY;  Surgeon: Michael [name], MD;  [facility];  Service: General;  Laterality: N/A;   PROCTOSCOPY N/A 04/01/2017   Procedure: RIDGE PROCTOSCOPY;  Surgeon: Michael [name], MD;  [facility];   Service: General;  Laterality: N/A;   REMOVAL OF BILATERAL TISSUE EXPANDERS WITH PLACEMENT OF BILATERAL BREAST IMPLANTS Bilateral 04/16/2016   Procedure: REMOVAL OF BILATERAL TISSUE EXPANDERS WITH PLACEMENT OF BILATERAL BREAST IMPLANTS;  Surgeon: Irene Limbo, MD;  Location: [place];  Service: Plastics;  Laterality: Bilateral;   ROBOTIC ASSISTED LAP VAGINAL HYSTERECTOMY N/A 11/19/2014   Procedure: ROBOTIC LYSIS OF ADHESIONS, CONVERTED TO LAPAROTOMY RADICAL UPPER VAGINECTOMY,LOW ANTERIOR BOWEL RESECTION, COLOSTOMY, BILATERAL URETERAL STENT PLACEMENT AND CYSTONOMY CLOSURE;  Surgeon: Everitt Amber, MD;  [facility];  Service: Gynecology;  Laterality: N/A;   TISSUE EXPANDER FILLING Bilateral 12/26/2015   Procedure: EXPANSION OF BILATERAL CHEST TISSUE EXPANDERS (60 mL- Right; 75 mL- Left);  Surgeon: Irene Limbo, MD;  Location: [place];  Service: Plastics;  Laterality: Bilateral;   TONSILLECTOMY     TOTAL ABDOMINAL HYSTERECTOMY  March 2006   [facility]   and Bilateral Salpingoophorectomy/  staging for Ovarian cancer/  an   XI ROBOTIC ASSISTED LOWER ANTERIOR RESECTION N/A 04/01/2017   Procedure: XI ROBOTIC VS LAPAROSCOPIC COLOSTOMY TAKEDOWN WITH LYSIS OF ADHESIONS.;  Surgeon: Michael [name], MD;  [facility];  Service: General;  Laterality: N/A;  ERAS PATHWAY    SOCIAL HISTORY: Social History   Tobacco Use   Smoking status: Never Smoker   Smokeless tobacco: Never Used  Substance Use Topics   Alcohol use: Not Currently   Drug use: No    FAMILY HISTORY: Family History  Problem Relation Age of Onset   Cancer Mother 66       stomach ca   Hypertension Mother    Cancer Father 58       prostate ca   Diabetes Father  Heart disease Father        CABG   Hypertension Father    Hyperlipidemia Father    Obesity Father    Breast cancer Maternal Aunt        dx in her 60s   Lymphoma Paternal 19    Brain cancer Paternal  Grandfather    Ovarian cancer Other    Diabetes Sister    Hypertension Brother y-10   Heart disease Brother        CABG   Diabetes Brother    ROS: Review of Systems  Endo/Heme/Allergies:       Negative for hypoglycemia.   PHYSICAL EXAM: Pt in no acute distress  RECENT LABS AND TESTS: BMET    Component Value Date/Time   NA 138 03/29/2019 1143   NA 142 11/29/2018   NA 141 01/24/2017 1228   K 4.1 03/29/2019 1143   K 4.6 01/24/2017 1228   CL 105 03/29/2019 1143   CO2 24 03/29/2019 1143   CO2 29 01/24/2017 1228   GLUCOSE 111 (H) 03/29/2019 1143   GLUCOSE 84 01/24/2017 1228   BUN 28 (H) 03/29/2019 1143   BUN 22 (A) 11/29/2018   BUN 22.2 01/24/2017 1228   CREATININE 1.56 (H) 03/29/2019 1143   CREATININE 1.98 (H) 03/27/2018 1324   CREATININE 0.8 01/24/2017 1228   CALCIUM 9.4 03/29/2019 1143   CALCIUM 10.2 01/24/2017 1228   GFRNONAA 35 (L) 03/29/2019 1143   GFRNONAA 26 (L) 03/27/2018 1324   GFRNONAA 78 12/16/2014 1530   GFRAA 40 (L) 03/29/2019 1143   GFRAA 30 (L) 03/27/2018 1324   GFRAA >89 12/16/2014 1530   Lab Results  Component Value Date   HGBA1C 7.9 (H) 09/01/2017   HGBA1C 5.3 04/01/2017   HGBA1C 5.8 (H) 09/05/2015   HGBA1C 6.8 (H) 11/21/2014   HGBA1C 6.3 (H) 11/07/2014   No results found for: INSULIN CBC    Component Value Date/Time   WBC 3.1 (L) 03/29/2019 1143   RBC 3.69 (L) 03/29/2019 1143   HGB 11.0 (L) 03/29/2019 1143   HGB 10.2 (L) 03/27/2018 1324   HGB 12.4 07/29/2017 1444   HCT 36.0 03/29/2019 1143   HCT 38.0 07/29/2017 1444   PLT 210 03/29/2019 1143   PLT 268 03/27/2018 1324   PLT 260 07/29/2017 1444   MCV 97.6 03/29/2019 1143   MCV 90.9 07/29/2017 1444   MCH 29.8 03/29/2019 1143   MCHC 30.6 03/29/2019 1143   RDW 15.1 03/29/2019 1143   RDW 15.5 (H) 07/29/2017 1444   LYMPHSABS 0.7 02/15/2019 1312   LYMPHSABS 0.8 (L) 07/29/2017 1444   MONOABS 0.7 02/15/2019 1312   MONOABS 1.0 (H) 07/29/2017 1444   EOSABS 0.2 02/15/2019 1312    EOSABS 0.0 07/29/2017 1444   BASOSABS 0.0 02/15/2019 1312   BASOSABS 0.0 07/29/2017 1444   Iron/TIBC/Ferritin/ %Sat    Component Value Date/Time   IRON 7 (L) 08/31/2017 0451   IRON 12 (L) 11/29/2014 1251   TIBC 164 (L) 08/31/2017 0451   TIBC 331 11/29/2014 1251   FERRITIN 27 11/29/2014 1251   IRONPCTSAT 4 (L) 08/31/2017 0451   IRONPCTSAT 4 (L) 11/29/2014 1251   Lipid Panel     Component Value Date/Time   CHOL 109 08/28/2015 1617   TRIG 89 04/18/2017 0427   HDL 43.80 08/28/2015 1617   CHOLHDL 2 08/28/2015 1617   VLDL 19.8 08/28/2015 1617   LDLCALC 45 08/28/2015 1617   Hepatic Function Panel     Component Value Date/Time  PROT 7.4 01/17/2019 0351   PROT 6.9 01/24/2017 1228   ALBUMIN 2.4 (L) 01/22/2019 0343   ALBUMIN 3.4 (L) 01/24/2017 1228   AST 24 01/17/2019 0351   AST 14 (L) 03/27/2018 1324   AST 16 01/24/2017 1228   ALT 53 (H) 01/17/2019 0351   ALT 18 03/27/2018 1324   ALT 14 01/24/2017 1228   ALKPHOS 305 (H) 01/17/2019 0351   ALKPHOS 90 01/24/2017 1228   BILITOT 0.6 01/17/2019 0351   BILITOT 0.2 (L) 03/27/2018 1324   BILITOT 0.34 01/24/2017 1228      Component Value Date/Time   TSH 2.408 08/30/2017 0623   TSH 0.63 08/28/2015 1617   Results for MYIAH, PETKUS (MRN [id number]) as of 07/11/2019 14:33  Ref. Range 04/05/2018 11:45  Vitamin D, 25-Hydroxy Latest Ref Range: 30.0 - 100.0 ng/mL 40.8   I, Michaelene Song, am acting as Location manager for Dennard Nip, MD  I have reviewed the above documentation for accuracy and completeness, and I agree with the above. -Dennard Nip, MD

## 2019-07-12 DIAGNOSIS — K604 Rectal fistula: Secondary | ICD-10-CM | POA: Diagnosis not present

## 2019-07-12 DIAGNOSIS — Z79899 Other long term (current) drug therapy: Secondary | ICD-10-CM | POA: Diagnosis not present

## 2019-07-12 DIAGNOSIS — Z5181 Encounter for therapeutic drug level monitoring: Secondary | ICD-10-CM | POA: Diagnosis not present

## 2019-07-12 DIAGNOSIS — E039 Hypothyroidism, unspecified: Secondary | ICD-10-CM | POA: Diagnosis not present

## 2019-07-12 DIAGNOSIS — E1140 Type 2 diabetes mellitus with diabetic neuropathy, unspecified: Secondary | ICD-10-CM | POA: Diagnosis not present

## 2019-07-12 DIAGNOSIS — Z8543 Personal history of malignant neoplasm of ovary: Secondary | ICD-10-CM | POA: Diagnosis not present

## 2019-07-12 DIAGNOSIS — N189 Chronic kidney disease, unspecified: Secondary | ICD-10-CM | POA: Diagnosis not present

## 2019-07-12 DIAGNOSIS — N321 Vesicointestinal fistula: Secondary | ICD-10-CM | POA: Diagnosis not present

## 2019-07-12 DIAGNOSIS — K219 Gastro-esophageal reflux disease without esophagitis: Secondary | ICD-10-CM | POA: Diagnosis not present

## 2019-07-12 DIAGNOSIS — E1122 Type 2 diabetes mellitus with diabetic chronic kidney disease: Secondary | ICD-10-CM | POA: Diagnosis not present

## 2019-07-12 DIAGNOSIS — Z8542 Personal history of malignant neoplasm of other parts of uterus: Secondary | ICD-10-CM | POA: Diagnosis not present

## 2019-07-12 DIAGNOSIS — Z9181 History of falling: Secondary | ICD-10-CM | POA: Diagnosis not present

## 2019-07-12 DIAGNOSIS — Z452 Encounter for adjustment and management of vascular access device: Secondary | ICD-10-CM | POA: Diagnosis not present

## 2019-07-13 ENCOUNTER — Other Ambulatory Visit: Payer: Self-pay | Admitting: Family Medicine

## 2019-07-13 ENCOUNTER — Encounter: Payer: Self-pay | Admitting: Family Medicine

## 2019-07-13 ENCOUNTER — Encounter: Payer: Self-pay | Admitting: Hematology and Oncology

## 2019-07-13 DIAGNOSIS — E785 Hyperlipidemia, unspecified: Secondary | ICD-10-CM | POA: Diagnosis not present

## 2019-07-13 DIAGNOSIS — E860 Dehydration: Secondary | ICD-10-CM | POA: Diagnosis not present

## 2019-07-13 DIAGNOSIS — G8929 Other chronic pain: Secondary | ICD-10-CM

## 2019-07-13 DIAGNOSIS — L89309 Pressure ulcer of unspecified buttock, unspecified stage: Secondary | ICD-10-CM | POA: Diagnosis not present

## 2019-07-13 MED ORDER — FENTANYL 12 MCG/HR TD PT72: 1.0000 | MEDICATED_PATCH | TRANSDERMAL | 0 refills | Status: DC

## 2019-07-16 DIAGNOSIS — L89309 Pressure ulcer of unspecified buttock, unspecified stage: Secondary | ICD-10-CM | POA: Diagnosis not present

## 2019-07-16 DIAGNOSIS — E785 Hyperlipidemia, unspecified: Secondary | ICD-10-CM | POA: Diagnosis not present

## 2019-07-16 DIAGNOSIS — E860 Dehydration: Secondary | ICD-10-CM | POA: Diagnosis not present

## 2019-07-16 DIAGNOSIS — R143 Flatulence: Secondary | ICD-10-CM | POA: Diagnosis not present

## 2019-07-16 DIAGNOSIS — R197 Diarrhea, unspecified: Secondary | ICD-10-CM | POA: Diagnosis not present

## 2019-07-18 DIAGNOSIS — N189 Chronic kidney disease, unspecified: Secondary | ICD-10-CM | POA: Diagnosis not present

## 2019-07-18 DIAGNOSIS — E1140 Type 2 diabetes mellitus with diabetic neuropathy, unspecified: Secondary | ICD-10-CM | POA: Diagnosis not present

## 2019-07-18 DIAGNOSIS — Z8543 Personal history of malignant neoplasm of ovary: Secondary | ICD-10-CM | POA: Diagnosis not present

## 2019-07-18 DIAGNOSIS — Z9181 History of falling: Secondary | ICD-10-CM | POA: Diagnosis not present

## 2019-07-18 DIAGNOSIS — K219 Gastro-esophageal reflux disease without esophagitis: Secondary | ICD-10-CM | POA: Diagnosis not present

## 2019-07-18 DIAGNOSIS — L89309 Pressure ulcer of unspecified buttock, unspecified stage: Secondary | ICD-10-CM | POA: Diagnosis not present

## 2019-07-18 DIAGNOSIS — N321 Vesicointestinal fistula: Secondary | ICD-10-CM | POA: Diagnosis not present

## 2019-07-18 DIAGNOSIS — E860 Dehydration: Secondary | ICD-10-CM | POA: Diagnosis not present

## 2019-07-18 DIAGNOSIS — E039 Hypothyroidism, unspecified: Secondary | ICD-10-CM | POA: Diagnosis not present

## 2019-07-18 DIAGNOSIS — E1122 Type 2 diabetes mellitus with diabetic chronic kidney disease: Secondary | ICD-10-CM | POA: Diagnosis not present

## 2019-07-18 DIAGNOSIS — Z5181 Encounter for therapeutic drug level monitoring: Secondary | ICD-10-CM | POA: Diagnosis not present

## 2019-07-18 DIAGNOSIS — Z79899 Other long term (current) drug therapy: Secondary | ICD-10-CM | POA: Diagnosis not present

## 2019-07-18 DIAGNOSIS — E785 Hyperlipidemia, unspecified: Secondary | ICD-10-CM | POA: Diagnosis not present

## 2019-07-18 DIAGNOSIS — K604 Rectal fistula: Secondary | ICD-10-CM | POA: Diagnosis not present

## 2019-07-18 DIAGNOSIS — Z8542 Personal history of malignant neoplasm of other parts of uterus: Secondary | ICD-10-CM | POA: Diagnosis not present

## 2019-07-18 DIAGNOSIS — Z452 Encounter for adjustment and management of vascular access device: Secondary | ICD-10-CM | POA: Diagnosis not present

## 2019-07-19 DIAGNOSIS — R197 Diarrhea, unspecified: Secondary | ICD-10-CM | POA: Diagnosis not present

## 2019-07-20 DIAGNOSIS — E785 Hyperlipidemia, unspecified: Secondary | ICD-10-CM | POA: Diagnosis not present

## 2019-07-20 DIAGNOSIS — L89309 Pressure ulcer of unspecified buttock, unspecified stage: Secondary | ICD-10-CM | POA: Diagnosis not present

## 2019-07-20 DIAGNOSIS — E860 Dehydration: Secondary | ICD-10-CM | POA: Diagnosis not present

## 2019-07-23 DIAGNOSIS — Z5181 Encounter for therapeutic drug level monitoring: Secondary | ICD-10-CM | POA: Diagnosis not present

## 2019-07-23 DIAGNOSIS — E860 Dehydration: Secondary | ICD-10-CM | POA: Diagnosis not present

## 2019-07-23 DIAGNOSIS — N189 Chronic kidney disease, unspecified: Secondary | ICD-10-CM | POA: Diagnosis not present

## 2019-07-23 DIAGNOSIS — Z8543 Personal history of malignant neoplasm of ovary: Secondary | ICD-10-CM | POA: Diagnosis not present

## 2019-07-23 DIAGNOSIS — E1140 Type 2 diabetes mellitus with diabetic neuropathy, unspecified: Secondary | ICD-10-CM | POA: Diagnosis not present

## 2019-07-23 DIAGNOSIS — E039 Hypothyroidism, unspecified: Secondary | ICD-10-CM | POA: Diagnosis not present

## 2019-07-23 DIAGNOSIS — K219 Gastro-esophageal reflux disease without esophagitis: Secondary | ICD-10-CM | POA: Diagnosis not present

## 2019-07-23 DIAGNOSIS — Z452 Encounter for adjustment and management of vascular access device: Secondary | ICD-10-CM | POA: Diagnosis not present

## 2019-07-23 DIAGNOSIS — E1122 Type 2 diabetes mellitus with diabetic chronic kidney disease: Secondary | ICD-10-CM | POA: Diagnosis not present

## 2019-07-23 DIAGNOSIS — K604 Rectal fistula: Secondary | ICD-10-CM | POA: Diagnosis not present

## 2019-07-23 DIAGNOSIS — N321 Vesicointestinal fistula: Secondary | ICD-10-CM | POA: Diagnosis not present

## 2019-07-23 DIAGNOSIS — E785 Hyperlipidemia, unspecified: Secondary | ICD-10-CM | POA: Diagnosis not present

## 2019-07-23 DIAGNOSIS — Z9181 History of falling: Secondary | ICD-10-CM | POA: Diagnosis not present

## 2019-07-23 DIAGNOSIS — Z79899 Other long term (current) drug therapy: Secondary | ICD-10-CM | POA: Diagnosis not present

## 2019-07-23 DIAGNOSIS — Z8542 Personal history of malignant neoplasm of other parts of uterus: Secondary | ICD-10-CM | POA: Diagnosis not present

## 2019-07-23 DIAGNOSIS — L89309 Pressure ulcer of unspecified buttock, unspecified stage: Secondary | ICD-10-CM | POA: Diagnosis not present

## 2019-07-24 ENCOUNTER — Ambulatory Visit: Payer: BC Managed Care – PPO | Admitting: Gynecologic Oncology

## 2019-07-24 ENCOUNTER — Inpatient Hospital Stay: Payer: BC Managed Care – PPO | Attending: Gynecologic Oncology | Admitting: Gynecologic Oncology

## 2019-07-24 ENCOUNTER — Other Ambulatory Visit: Payer: Self-pay

## 2019-07-24 ENCOUNTER — Encounter: Payer: Self-pay | Admitting: Gynecologic Oncology

## 2019-07-24 VITALS — BP 142/79 | HR 87 | Temp 98.5°F | Resp 17 | Ht 62.5 in | Wt 195.6 lb

## 2019-07-24 DIAGNOSIS — Z923 Personal history of irradiation: Secondary | ICD-10-CM | POA: Insufficient documentation

## 2019-07-24 DIAGNOSIS — Z933 Colostomy status: Secondary | ICD-10-CM | POA: Diagnosis not present

## 2019-07-24 DIAGNOSIS — Z8542 Personal history of malignant neoplasm of other parts of uterus: Secondary | ICD-10-CM | POA: Diagnosis not present

## 2019-07-24 DIAGNOSIS — C541 Malignant neoplasm of endometrium: Secondary | ICD-10-CM

## 2019-07-24 DIAGNOSIS — N898 Other specified noninflammatory disorders of vagina: Secondary | ICD-10-CM

## 2019-07-24 DIAGNOSIS — C569 Malignant neoplasm of unspecified ovary: Secondary | ICD-10-CM | POA: Insufficient documentation

## 2019-07-24 DIAGNOSIS — Z8543 Personal history of malignant neoplasm of ovary: Secondary | ICD-10-CM

## 2019-07-24 NOTE — Patient Instructions (Signed)
Dr Denman [name] is recommending trying insertion of a lubricated small size tampon to control the drainage.  She will see you back for follow-up in 6 months.

## 2019-07-24 NOTE — Progress Notes (Signed)
Recurrent Endometrial cancer FOLLOWUP VISIT  Assessment:    63 y.o. year old with history of recurrent endometrioid endometrial/ovarian cancer.   S/p exploratory laparotomy, posterior supralevator pelvic exenteration, end colostomy, bladder repair, ureteral stenting with complete resection and negative margins on 11/09/14. S/p adjuvant radiation completed 03/10/15 Disease free since this time.  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  Status post pelvic exenteration procedure at [facility] in September 2020 with proctectomy and cystectomy and formation of permanent colostomy and ileal conduit.  Vaginectomy not performed.  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 No specific follow-up or surveillance recommended by genetics counselor.  Plan: 1) History of recurrent endometrial/ovarian cancer: s/p complete resection with negative margins and s/p adjuvant radiation. No chemotherapy due to chronic idiopathic neutropenia.   CT imaging post treatment shows stable thickening of upper vagina and right peri-urethral tissues which is most likely radiation changes and postop changes.  Will continue 6 monthly surveillance exams until August, 2021.  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  Has persistent vaginal drainage despite cystectomy and total proctectomy. I explained to Dierdra that realistically she will likely always live with some element of vaginal drainage.  The treatment for this would require total vaginectomy and placement of myocutaneous flap.  The ideal opportunity to of perform this would have been during her exenteration procedure in September  2020.  However it was elected to not have this performed during the procedure by her surgeons.  Therefore she is likely lost the opportunity to undergo such a procedure as reembarking on another major surgery for this would almost certainly involve having her colostomy and ileal conduit having to be taken down and reformed and she would likely have significant convalescence and complications.  Additionally there would be no guarantee that her severely affected tissue from radiation would heal such as a second surgery and result in good outcomes.  I counseled her regarding use of a small size tampon to attempt to control the drainage without use of an external pad.  3) neutropenia - Dr Alvy Bimler is managing this and we appreciate.  4) breast cancer - stage IIA. ER/PR positive. Treated with surgery and arimidex.  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 Will have patient follow-up with Dr Cristina Gong for close colon cancer and upper GI cancer surveillance  6) Follow-up: I will see Passion for followup in June, 2021  HPI:  Kynzli Rease Mallen is a 63 y.o. 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  She has a history of endometrial and ovarian endometrioid carcinoma treated in 2006 by Dr. [name] at [facility] in [place]. Her surgery (TAH, BSO) was followed by adjuvant chemotherapy with carboplatin plus paclitaxel due to the ovarian involvement and the presence of a cul de sac lesion also positive for disease. She denies receiving adjuvant radiation therapy. She is unclear if she had metastatic endometrial cancer to the ovary or duel primaries. She had a complete response to therapy however developed leukopenia in July 2010. After extensive workup which included bone marrow biopsy, she was determined to have chronic idiopathic neutropenia presumed related to previous  chemotherapy. She sees Dr. Alvy Bimler for this  and is treated with G-CSF injections.  She began experiencing rectal pain approximately in January 2016. She also reports narrowing of caliber of the stool. She denies hematochezia. She does report approximately 3 months of vaginal spotting. She's had no specific follow-up for her gynecologic cancers in the past 4 years.  As part of workup of her rectal pain she underwent a CT scan of the abdomen and pelvis on 09/12/2014. This demonstrated a new right perirectal mass abutting the vaginal cuff measuring 3.8 x 4.9 cm. There is a limited fat plane between the mass and the rectum posteriorly. Rectal invasion could not be excluded. There were no other masses identified in the abdomen and pelvis or lymphadenopathy. There is no other evidence of metastatic disease or recurrent disease. There was no hydronephrosis. A CA-125 drawn on 09/12/2014 was normal at 10.  PET was negative for extrapelvic disease. MRI defined the lesion as a 3.5x5cm lesion to the right of the rectum at the vaginal cuff.  Colonoscopy was performed on 10/02/14 with transrectal [place] and biopsy and this revealed endometrioid adenocarcinoma. Of note, the lesion was not seen within the lumen of the rectum.   She then underwent a posterior supralevator exenteration with colostomy and bladder repair and stent placement on 09/28/31 without complications with Dr Denman [name] and Dr Johney Maine.  Her postoperative course was uncomplicate with the exception of development of postop anemia.  Her final pathology revealed endometrioid adenocarcinoma invading the vagina and rectum with negative margins on the specimen. The margin had been close (clinically) to the right pelvic sidewall which was marked with surgical clips.  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 It was treated with IV antibiotics and then on POD 15 she was taken to the OR for  laparoscopic revision of the stoma with Dr Michael [name]. Postoperatively she had wound vac and packing for her stomal wound.  A retrograde cystogram on week 4 postop confirmed an intact bladder and the foley was removed.  She initially had some voiding issues with decreased sensation to void. We tested a post void residual in May, 2016 and this revealed adequate voiding.  On 12/30/14 she was admitted to [facility] with sepsis associated with Enterobacter Cloacae UTI. This was treated with IV antibiotics and then a prolonged course of oral cipro. Imaging performed at the time of admission (a CT of the abdo/pelvis) revealed: Bilateral double-J internal ureteral stents in adequate position with persistent mild bilateral hydronephrosis   She complete radiation therapy from 01/29/15 to 03/10/15 with 50Gy of external beam radiation and IMRT. She tolerated therapy well with minor skin irritation.  She had her ureteral stents removed in June, 2016, however, then developed right ureteral obstruction, hydroureter and pain. She went to the OR with Urologist Dr Tresa Moore on 03/20/15 for ureteral dilation and right stent placement (the left was draining well). No tumor was seen on cysto.   CT abdo/pelvis on 05/14/15 showed: no new lesions, stable thickening in right pelvis/distal right ureter consistent with radiation effect. The right ureteral stent was removed.  The patient's CT in January 2017 showed progression of her right ureteral obstruction after stent removal. Dr Tresa Moore took her to the OR on 09/05/15 for a cystoscopy, retrograde pyelogram and right ureteral stent placement.   The CT on 08/18/15 showed decreased attenuation of the right renal parenchyma, right hydronephrosis that was severe no excretion of contrast on that for graphic phase imaging. The  perivascular soft tissue thickening in the pelvis and presacral regions grossly stable consistent with radiation changes.  The patient was diagnosed with ER/PR  positive left breast cancer in April 2017. Surgery is scheduled for May. She underwent bilateral mastectomy with expander reconstruction. Postoperatively she was treated with Arimidex.   Repeat CT imaging on 02/08/16 revealed : Stable post treatment changes in presacral region. No evidence of recurrent or metastatic carcinoma within the abdomen or pelvis. Interval placement of right ureteral stent in appropriate position. Moderate right hydroureteronephrosis remains stable. Stable benign left adrenal adenoma and hepatic steatosis.  On 06/30/16 she had replacement of her right ureteral stent with Dr Tresa Moore.  On 10/07/16 CT surveillance was performed which showed: Stable exam.  No new or progressive findings. Stable appearance abnormal presacral soft tissue compatible with post treatment change. Internal right ureteral stent with persistent right hydroureteronephrosis. Differentially decreased perfusion of the right kidney suggests a component of underlying obstructive uropathy. Stable 13 mm left adrenal nodule, previously characterized as adenoma. A parastomal hernia was noted.  CT 01/27/17 showed stable findings with no evidence of recurrence. Persistent right hydroureter was again noted.  Her colostomy bothered her greatly and she hoped to be free of stomal appliances. She was frustrated with repetitive stent replacements and desired definitive reconstruction/reimplantation of the right ureter.    Given that a 2 year disease free interval had elapsed, in 04/01/17 Dr Tresa Moore and Dr Johney Maine returned to the operating room with Ms Olund for a right ureteral resection, and bladder hitch with reimplantation and reversal of hartman's with temporary ileostomy complicated by postop development of urinoma which required a peritoneal drain and right ureteral stent.  She subsequently developed a colovesicular fistula which caused breakdown of the skin on her buttocks from chronic maceration and immobility due to her  convalescence. She was offered permanent diversion with ileal conduit and permanent end colostomy. This was not considered a reasonable option at that time by [facility]. She became quite depressed by the slow recovery and sequelae of her last surgery. She sought second opinion with Gynecologic Oncology at [facility]. They offered referral to urology and colorectal specialists at [facility]. They recommended no additional cancer treatment as she was disease free. She is being worked up by them for possible laparotomy, revision of right uretero-neocystotomy, placement of VRAM flap, and possible bowel resection and revision of ileostomy. Prior to embarking on this surgery they requested she lose weight to a BMI of <30kg/m2.  She had lost weight to a BMI <30 throughout 2020. She experienced some episodes of passage of bloody mucous from the vagina in May, 2020 and so an CT scan was performed which showed no signs of pelvic recurrence.   Interval Hx:  In September the patient embarked on a large procedure at [facility].  She underwent an exploratory laparotomy panniculectomy cystectomy creation of urinary conduit with ileum, proctectomy, and formation of permanent and colostomy.  The procedure was uncomplicated.  She reported that while plastic surgery had plan to take part in the procedure with placement of a flap, it was determined that this was not necessary intraoperatively.  While the procedure note in care everywhere states that a hysterectomy was performed, this had already been performed more than a decade prior.  Additionally there was no myocutaneous flap placed despite the procedure reporting as such.  No malignancy was identified in the specimens  The patient had an uncomplicated procedure overall and a 7-day hospital stay with subsequent rehab.  She developed some  wound healing issues at her colostomy site.  She is tearful when talking about her lack of recovery following the surgery she  continues to have persistent vaginal drainage which she had hoped would go away permanently after the procedure.  Additionally she has continued need for electrolyte and IV fluid replacement.  The vaginal persistent drainage is greatest concern and she does not feel that she could live the rest of her life with this.  Review of systems: Constitutional:  She has no weight gain or weight loss. She has a low grade fever no chills. Eyes: No blurred vision Ears, Nose, Mouth, Throat: No dizziness, headaches or changes in hearing. No mouth sores. Cardiovascular: No chest pain, palpitations or edema. Respiratory:  No shortness of breath, wheezing  Gastrointestinal: She has normal bowel movements trhough stoma without diarrhea or constipation. She denies any nausea or vomiting. She denies blood in her stool or heart burn. Genitourinary:  See HPI Musculoskeletal: Denies muscle weakness or joint pains.  Skin:  She has no skin changes, rashes or itching Neurological:  Denies dizziness or headaches. No neuropathy, no numbness or tingling. Psychiatric:  She denies depression or anxiety. Hematologic/Lymphatic:   No easy bruising or bleeding  Allergies  Allergen Reactions  . Penicillins Swelling    Facial swelling/childhood allergy Has patient had a PCN reaction causing immediate rash, facial/tongue/throat swelling, SOB or lightheadedness with hypotension: Yes Has patient had a PCN reaction causing severe rash involving mucus membranes or skin necrosis: Yes Has patient had a PCN reaction that required hospitalization yes Has patient had a PCN reaction occurring within the last 10 years: No If all of the above answers are "NO", then may proceed with Cephalosporin use.   . Cefaclor Rash    Ceclor  . Erythromycin Other (See Comments)    Gastritis, abd cramps  . Tape Rash    blisters  . Trimethoprim Rash  . Ultram [Tramadol] Hives  . Cephalosporins Rash  . Fluconazole Rash  . Oxycodone Other (See  Comments)    " I just feel weird"  . Pectin Rash    Pectin ring for stoma  . Septra [Sulfamethoxazole-Trimethoprim] Rash  . Sulfa Antibiotics Rash    Current Outpatient Medications on File Prior to Visit  Medication Sig Dispense Refill  . acetaminophen (TYLENOL) 325 MG tablet Take 2 tablets (650 mg total) by mouth every 6 (six) hours as needed. 30 tablet 1  . anastrozole (ARIMIDEX) 1 MG tablet Take 1 tablet (1 mg total) by mouth daily. 90 tablet 3  . Biotin 5 MG TABS Take 5 mg by mouth daily.     . Cholecalciferol (VITAMIN D3) 10000 UNITS capsule Take 10,000 Units by mouth every Sunday.     . clobetasol (OLUX) 0.05 % topical foam Apply topically 2 (two) times daily.    . diphenhydrAMINE (BENADRYL) 25 mg capsule Take 1 capsule (25 mg total) by mouth every 8 (eight) hours as needed for itching, allergies or sleep. 30 capsule 0  . fentaNYL (DURAGESIC) 12 MCG/HR Place 1 patch onto the skin every 3 (three) days. 10 patch 0  . fentaNYL (DURAGESIC) 25 MCG/HR Place 1 patch onto the skin every 3 (three) days. 10 patch 0  . ferrous sulfate 325 (65 FE) MG tablet Take 1 tablet (325 mg total) by mouth at bedtime. 30 tablet 3  . filgrastim-sndz (ZARXIO) 480 MCG/0.8ML SOSY injection Inject 0.8 mLs (480 mcg total) into the skin as directed. Inject 480 mcg every 5 days 0.8 mL 99  .  JANUVIA 50 MG tablet Take 50 mg by mouth daily.     [name] Kitchen levothyroxine (SYNTHROID, LEVOTHROID) 150 MCG tablet Take 1 tablet (150 mcg total) by mouth daily before breakfast. 30 tablet 1  . loratadine (CLARITIN) 10 MG tablet Take 10 mg by mouth daily.     . nitrofurantoin, macrocrystal-monohydrate, (MACROBID) 100 MG capsule Take 100 mg by mouth at bedtime.    [name] Kitchen omega-3 acid ethyl esters (LOVAZA) 1 G capsule Take 1 g by mouth 2 (two) times daily.     Vladimir Faster Glycol-Propyl Glycol (SYSTANE OP) Place 1 drop into both eyes daily as needed (dry eyes).     . pregabalin (LYRICA) 50 MG capsule Take 1 capsule (50 mg total) by mouth 2 (two)  times daily. 60 capsule 2  . Prenatal Vit-Fe Fumarate-FA (PRENATAL VITAMIN PO) Take 1 capsule by mouth daily. Takes prenatal because there are no dyes in it    . Probiotic Product (ALIGN PO) Take by mouth 2 (two) times daily.    . rosuvastatin (CRESTOR) 10 MG tablet Take 10 mg by mouth every evening.     . Saccharomyces boulardii (FLORASTOR PO) Take 1 capsule by mouth daily.     No current facility-administered medications on file prior to visit.    Past Medical History:  Diagnosis Date  . Adrenal adenoma, left 02/08/2016   CT: stable benign  . Anemia in neoplastic disease   . Back pain   . Benign essential HTN   . 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 Cancer of corpus uteri, except isthmus [facility])  oncologist-- dr Denman [name] and dr Alvy Bimler    10-15-2004  dx endometroid endometrial and ovarian cancer s/p  chemotheapy and surgery(TAH w/ BSO) :  recurrent 11-19-2014 post pelvic surgery and radiation 01-29-2015 to 03-10-2015  . Chronic idiopathic neutropenia (HCC)    presumed related to chemotherapy March 2006--- followed by dr Alvy Bimler (treatment w/ G-CSF injections  . Chronic nausea   . Chronic pain    perineal/ anal  area from bladder pad irritates skin , right flank pain  . CKD stage G2/A3, GFR 60-89 and albumin creatinine ratio >300 mg/g    nephrologist-  dr Madelon Lips  . Colovesical fistula   . Diabetic retinopathy, background ([place])   . Difficult intravenous access    small veins--- hx PICC lines  . DM type 2 (diabetes mellitus, type 2) ([place])    monitored by dr Legrand Como altheimer  . Dysuria   . Environmental and seasonal allergies   . Fatty liver 02/08/2016   CT  . Generalized muscle weakness   . GERD (gastroesophageal reflux disease)   . Hiatal hernia   . History of abdominal abscess  04/16/2017   post surgery 04-01-2017  --- resolved 10/ 2018  . History of gastric polyp    2014  duodenum  . History of ileus 04/16/2017   resolved w/ no surgical intervention  . History of radiation therapy    01-29-2015 to 03-10-2015  pelvis 50.4Gy  . Hypothyroidism    monitored by dr Legrand Como altheimer  . IBS (irritable bowel syndrome)   . Ileostomy in place [facility]) 04/01/2017   created at same time colostomy takedown.  . Joint pain   . Leg edema   . Lower urinary tract symptoms (LUTS)    urge urinary  incontinence  . Mixed dyslipidemia   . Multiple thyroid nodules    Managed by Dr. Harlow Asa  . Nephrostomy status ([place])   . Palpitations   . Pelvic abscess in female 04/16/2017  . PONV (postoperative nausea and vomiting)    "scopolamine patch works for me"  . Radiation-induced dermatitis    contact dermatitis , radiation completed, rash only on ankles now.  . SBO (small bowel obstruction) ([place]) 01/2019  . Seasonal allergies   . Ureteral stricture, right UROLOGIT-  DR [facility]   CHRONIC--  TREATMENT URETERAL STENT  . 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 Vitamin D deficiency   . Wears glasses     Past Surgical History:  Procedure Laterality Date  . APPENDECTOMY    . biopsy thyroid nodules    . BREAST RECONSTRUCTION WITH PLACEMENT OF TISSUE EXPANDER AND FLEX HD (ACELLULAR HYDRATED DERMIS) Bilateral 12/11/2015   Procedure: BILATERAL BREAST RECONSTRUCTION WITH PLACEMENT OF TISSUE EXPANDERS;  Surgeon: Irene Limbo, MD;  Location: [place];  Service: Plastics;  Laterality: Bilateral;  . COLONOSCOPY WITH PROPOFOL N/A 08/21/2013   Procedure: COLONOSCOPY WITH PROPOFOL;  Surgeon: Cleotis Nipper, MD;  Location: [facility];  Service: Endoscopy;  Laterality: N/A;  . COLOSTOMY TAKEDOWN N/A 12/04/2014   Procedure: LAPROSCOPIC LYSIS OF ADHESIONS, SPLENIC MOBILIZATION, RELOCATION OF COLOSTOMY, DEBRIDEMENT INITIAL COLOSTOMY SITE;  Surgeon: Michael [name], MD;  Location: [facility];  Service:  General;  Laterality: N/A;  . CYSTOGRAM N/A 06/01/2017   Procedure: CYSTOGRAM;  Surgeon: Alexis Frock, MD;  Location: [facility];  Service: Urology;  Laterality: N/A;  . CYSTOSCOPY W/ RETROGRADES Right 11/21/2015   Procedure: CYSTOSCOPY WITH RETROGRADE PYELOGRAM;  Surgeon: Alexis Frock, MD;  [facility];  Service: Urology;  Laterality: Right;  . CYSTOSCOPY W/ URETERAL STENT PLACEMENT Right 11/21/2015   Procedure: CYSTOSCOPY WITH STENT REPLACEMENT;  Surgeon: Alexis Frock, MD;  [facility];  Service: Urology;  Laterality: Right;  . CYSTOSCOPY W/ URETERAL STENT PLACEMENT Right 03/10/2016   Procedure: CYSTOSCOPY WITH STENT REPLACEMENT;  Surgeon: Alexis Frock, MD;  Location: [facility];  Service: Urology;  Laterality: Right;  . CYSTOSCOPY W/ URETERAL STENT PLACEMENT Right 06/30/2016   Procedure: CYSTOSCOPY WITH RETROGRADE PYELOGRAM/URETERAL STENT EXCHANGE;  Surgeon: Alexis Frock, MD;  Location: [facility];  Service: Urology;  Laterality: Right;  . CYSTOSCOPY W/ URETERAL STENT PLACEMENT N/A 06/01/2017   Procedure: CYSTOSCOPY WITH EXAM UNDER ANESTHESIA;  Surgeon: Alexis Frock, MD;  [facility];  Service: Urology;  Laterality: N/A;  . CYSTOSCOPY W/ URETERAL STENT PLACEMENT Right 08/17/2017   Procedure: CYSTOSCOPY WITH RETROGRADE PYELOGRAM/URETERAL STENT REMOVAL;  Surgeon: Alexis Frock, MD;  Location[facility];  Service: Urology;  Laterality: Right;  . CYSTOSCOPY WITH RETROGRADE PYELOGRAM, URETEROSCOPY AND STENT PLACEMENT Right 03/20/2015   Procedure: CYSTOSCOPY WITH RETROGRADE PYELOGRAM, URETEROSCOPY WITH BALLOON DILATION AND STENT PLACEMENT ON RIGHT;  Surgeon: Alexis Frock, MD;  Location: [facility];  Service: Urology;  Laterality: Right;  . CYSTOSCOPY WITH RETROGRADE PYELOGRAM, URETEROSCOPY AND STENT PLACEMENT Right 05/02/2015   Procedure: CYSTOSCOPY WITH RIGHT RETROGRADE PYELOGRAM,  DIAGNOSTIC URETEROSCOPY AND STENT  PULL ;  Surgeon: Alexis Frock, MD;  Location: [facility];  Service: Urology;  Laterality: Right;  . CYSTOSCOPY WITH RETROGRADE PYELOGRAM, URETEROSCOPY AND STENT PLACEMENT Right 09/05/2015   Procedure: CYSTOSCOPY WITH RETROGRADE PYELOGRAM,  AND STENT PLACEMENT;  Surgeon: Alexis Frock, MD;  [facility];  Service: Urology;  Laterality: Right;  . CYSTOSCOPY WITH RETROGRADE PYELOGRAM, URETEROSCOPY  AND STENT PLACEMENT Right 04/01/2017   Procedure: CYSTOSCOPY WITH RETROGRADE PYELOGRAM, URETEROSCOPY AND STENT PLACEMENT;  Surgeon: Alexis Frock, MD;  [facility];  Service: Urology;  Laterality: Right;  . CYSTOSCOPY WITH STENT PLACEMENT Right 10/27/2016   Procedure: CYSTOSCOPY WITH STENT CHANGE and right retrograde pyelogram;  Surgeon: Alexis Frock, MD;  Location: [facility];  Service: Urology;  Laterality: Right;  . EUS N/A 10/02/2014   Procedure: LOWER ENDOSCOPIC ULTRASOUND (EUS);  Surgeon: Arta Silence, MD;  Location: [facility];  Service: Endoscopy;  Laterality: N/A;  . EXCISION SOFT TISSUE MASS RIGHT FOREMAN  12-08-2006  . EYE SURGERY  as child   pytosis of eyelids repair  . INCISION AND DRAINAGE OF WOUND Bilateral 12/26/2015   Procedure: DEBRIDEMENT OF BILATERAL MASTECTOMY FLAPS;  Surgeon: Irene Limbo, MD;  Location: [place];  Service: Plastics;  Laterality: Bilateral;  . IR CV LINE INJECTION  05/31/2017  . IR FLUORO GUIDE CV LINE LEFT  05/31/2017  . IR FLUORO GUIDE CV LINE RIGHT  04/06/2017  . IR FLUORO GUIDE CV MIDLINE PICC RIGHT  05/30/2017  . IR NEPHROSTOGRAM LEFT INITIAL PLACEMENT  09/02/2017  . IR NEPHROSTOGRAM LEFT THRU EXISTING ACCESS  11/29/2017  . IR NEPHROSTOGRAM RIGHT INITIAL PLACEMENT  09/02/2017  . IR NEPHROSTOGRAM RIGHT THRU EXISTING ACCESS  09/13/2017  . IR NEPHROSTOGRAM RIGHT THRU EXISTING ACCESS  11/29/2017  . IR NEPHROSTOMY EXCHANGE LEFT  11/28/2017  . IR NEPHROSTOMY EXCHANGE LEFT  01/05/2018  . IR NEPHROSTOMY EXCHANGE  LEFT  02/16/2018  . IR NEPHROSTOMY EXCHANGE LEFT  03/30/2018  . IR NEPHROSTOMY EXCHANGE LEFT  05/12/2018  . IR NEPHROSTOMY EXCHANGE LEFT  06/21/2018  . IR NEPHROSTOMY EXCHANGE LEFT  08/04/2018  . IR NEPHROSTOMY EXCHANGE LEFT  09/18/2018  . IR NEPHROSTOMY EXCHANGE LEFT  10/09/2018  . IR NEPHROSTOMY EXCHANGE LEFT  10/27/2018  . IR NEPHROSTOMY EXCHANGE LEFT  11/21/2018  . IR NEPHROSTOMY EXCHANGE LEFT  01/05/2019  . IR NEPHROSTOMY EXCHANGE LEFT  02/15/2019  . IR NEPHROSTOMY EXCHANGE LEFT  03/29/2019  . IR NEPHROSTOMY EXCHANGE RIGHT  10/02/2017  . IR NEPHROSTOMY EXCHANGE RIGHT  11/28/2017  . IR NEPHROSTOMY EXCHANGE RIGHT  01/05/2018  . IR NEPHROSTOMY EXCHANGE RIGHT  02/16/2018  . IR NEPHROSTOMY EXCHANGE RIGHT  03/30/2018  . IR NEPHROSTOMY EXCHANGE RIGHT  05/12/2018  . IR NEPHROSTOMY EXCHANGE RIGHT  06/21/2018  . IR NEPHROSTOMY EXCHANGE RIGHT  08/04/2018  . IR NEPHROSTOMY EXCHANGE RIGHT  09/18/2018  . IR NEPHROSTOMY EXCHANGE RIGHT  10/27/2018  . IR NEPHROSTOMY EXCHANGE RIGHT  11/21/2018  . IR NEPHROSTOMY EXCHANGE RIGHT  01/05/2019  . IR NEPHROSTOMY EXCHANGE RIGHT  02/15/2019  . IR NEPHROSTOMY EXCHANGE RIGHT  03/29/2019  . IR NEPHROSTOMY PLACEMENT LEFT  10/02/2017  . IR RADIOLOGIST EVAL & MGMT  05/03/2017  . IR US GUIDE VASC ACCESS LEFT  05/31/2017  . IR US GUIDE VASC ACCESS RIGHT  04/06/2017  . IR US GUIDE VASC ACCESS RIGHT  05/30/2017  . LAPAROSCOPIC CHOLECYSTECTOMY  1990  . LIPOSUCTION WITH LIPOFILLING Bilateral 04/16/2016   Procedure: LIPOSUCTION WITH LIPOFILLING TO BILATERAL CHEST;  Surgeon: Irene Limbo, MD;  Location: [place];  Service: Plastics;  Laterality: Bilateral;  . MASTECTOMY W/ SENTINEL NODE BIOPSY Bilateral 12/11/2015   Procedure: RIGHT PROPHYLACTIC MASTECTOMY, LEFT TOTAL MASTECTOMY WITH LEFT AXILLARY SENTINEL LYMPH NODE BIOPSY;  Surgeon: Stark Klein, MD;  Location: [place];  Service: General;  Laterality: Bilateral;  . OSTOMY N/A 11/19/2014   Procedure: OSTOMY;  Surgeon:  Michael [name],  MD;  Location: [facility];  Service: General;  Laterality: N/A;  . PROCTOSCOPY N/A 04/01/2017   Procedure: RIDGE PROCTOSCOPY;  Surgeon: Michael [name], MD;  Location: [facility];  Service: General;  Laterality: N/A;  . REMOVAL OF BILATERAL TISSUE EXPANDERS WITH PLACEMENT OF BILATERAL BREAST IMPLANTS Bilateral 04/16/2016   Procedure: REMOVAL OF BILATERAL TISSUE EXPANDERS WITH PLACEMENT OF BILATERAL BREAST IMPLANTS;  Surgeon: Irene Limbo, MD;  Location: [place];  Service: Plastics;  Laterality: Bilateral;  . ROBOTIC ASSISTED LAP VAGINAL HYSTERECTOMY N/A 11/19/2014   Procedure: ROBOTIC LYSIS OF ADHESIONS, CONVERTED TO LAPAROTOMY RADICAL UPPER VAGINECTOMY,LOW ANTERIOR BOWEL RESECTION, COLOSTOMY, BILATERAL URETERAL STENT PLACEMENT AND CYSTONOMY CLOSURE;  Surgeon: Everitt Amber, MD;  Location: [facility];  Service: Gynecology;  Laterality: N/A;  . TISSUE EXPANDER FILLING Bilateral 12/26/2015   Procedure: EXPANSION OF BILATERAL CHEST TISSUE EXPANDERS (60 mL- Right; 75 mL- Left);  Surgeon: Irene Limbo, MD;  Location: [place];  Service: Plastics;  Laterality: Bilateral;  . TONSILLECTOMY    . TOTAL ABDOMINAL HYSTERECTOMY  March 2006   [facility]   and Bilateral Salpingoophorectomy/  staging for Ovarian cancer/  an  . XI ROBOTIC ASSISTED LOWER ANTERIOR RESECTION N/A 04/01/2017   Procedure: XI ROBOTIC VS LAPAROSCOPIC COLOSTOMY TAKEDOWN WITH LYSIS OF ADHESIONS.;  Surgeon: Michael [name], MD;  Location: [facility];  Service: General;  Laterality: N/A;  ERAS PATHWAY    Family History  Problem Relation Age of Onset  . Cancer Mother 57       stomach ca  . Hypertension Mother   . Cancer Father 63       prostate ca  . Diabetes Father   . Heart disease Father        CABG  . Hypertension Father   . Hyperlipidemia Father   . Obesity Father   . Breast cancer Maternal Aunt        dx in her 50s  . Lymphoma Paternal Aunt   . Brain cancer Paternal Grandfather   . Ovarian cancer Other   .  Diabetes Sister   . Hypertension Brother y-10  . Heart disease Brother        CABG  . Diabetes Brother     Social History   Socioeconomic History  . Marital status: Married    Spouse name: Aren Cherne  . Number of children: 1  . Years of education: Not on file  . Highest education level: Not on file  Occupational History  . 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 Smoking status: Never Smoker  . Smokeless tobacco: Never Used  Substance and Sexual Activity  . Alcohol use: Not Currently  . Drug use: No  . Sexual activity: Not on file  Other Topics Concern  . Not on file  Social History Narrative   Exercise-- has not gotten back into it since cancer came back   Social Determinants of Health   Financial Resource Strain:   . Difficulty of Paying Living Expenses: Not on file  Food Insecurity:   . Worried About Charity fundraiser in the Last Year: Not on file  . Ran Out of Food in the Last Year: Not on file  Transportation Needs:   . Lack of Transportation (Medical): Not on file  . Lack of Transportation (Non-Medical): Not on file  Physical Activity:   . Days of Exercise per Week: Not on file  . Minutes of  Exercise per Session: Not on file  Stress:   . Feeling of Stress : Not on file  Social Connections:   . Frequency of Communication with Friends and Family: Not on file  . Frequency of Social Gatherings with Friends and Family: Not on file  . Attends Religious Services: Not on file  . Active Member of Clubs or Organizations: Not on file  . Attends Archivist Meetings: Not on file  . Marital Status: Not on file  Intimate Partner Violence:   . Fear of Current or Ex-Partner: Not on file  . Emotionally Abused: Not on file  . Physically Abused: Not on file  . Sexually Abused: Not on file   CBC    Component Value Date/Time   WBC 3.1 (L) 03/29/2019 1143   RBC 3.69 (L) 03/29/2019 1143   HGB 11.0 (L) 03/29/2019 1143   HGB  10.2 (L) 03/27/2018 1324   HGB 12.4 07/29/2017 1444   HCT 36.0 03/29/2019 1143   HCT 38.0 07/29/2017 1444   PLT 210 03/29/2019 1143   PLT 268 03/27/2018 1324   PLT 260 07/29/2017 1444   MCV 97.6 03/29/2019 1143   MCV 90.9 07/29/2017 1444   MCH 29.8 03/29/2019 1143   MCHC 30.6 03/29/2019 1143   RDW 15.1 03/29/2019 1143   RDW 15.5 (H) 07/29/2017 1444   LYMPHSABS 0.7 02/15/2019 1312   LYMPHSABS 0.8 (L) 07/29/2017 1444   MONOABS 0.7 02/15/2019 1312   MONOABS 1.0 (H) 07/29/2017 1444   EOSABS 0.2 02/15/2019 1312   EOSABS 0.0 07/29/2017 1444   BASOSABS 0.0 02/15/2019 1312   BASOSABS 0.0 07/29/2017 1444     Physical Exam: Blood pressure (!) 142/79, pulse 87, temperature 98.5 F (36.9 C), temperature source Temporal, resp. rate 17, height 5' 2.5" (1.588 m), weight 195 lb 9.6 oz (88.7 kg), SpO2 99 %. General: Well dressed, well nourished in no apparent distress.   HEENT:  Normocephalic and atraumatic, no lesions.  Extraocular muscles intact. Sclerae anicteric. Pupils equal, round, reactive. No mouth sores or ulcers. Thyroid is normal size, not nodular, midline. Skin:  No lesions or rashes. Breasts:  deferred Lungs:  Clear to auscultation bilaterally.  No wheezes. Cardiovascular:  Regular rate and rhythm.  No murmurs or rubs. Abdomen:   Soft, somewhat tender, nondistended.  No palpable masses.  No hepatosplenomegaly.  No ascites. Normal bowel sounds.  No hernias.  Ostomies with opaque appliances.  Genitourinary: vaginal cuff intact without blood or fluid in the vault. Severe radiation fibrosis with indurated tissues. Normal mucosa. No palpable masses in pelvis. Therei s no visible drainage in the vagina on today's exam (or palpable fistulous tract) Rectal exam: deferred (oversewn). Extremities: No cyanosis, clubbing or edema.  No calf tenderness or erythema. No palpable cords. Psychiatric: Mood and affect are appropriate. Neurological: Awake, alert and oriented x 3. Sensation is intact, no  neuropathy.  Musculoskeletal: No pain, normal strength and range of motion.  Thereasa Solo, MD  CC: Dr Tresa Moore, Dr Alvy Bimler, Dr Theressa Millard, Dr Madelon Lips

## 2019-07-25 ENCOUNTER — Telehealth (INDEPENDENT_AMBULATORY_CARE_PROVIDER_SITE_OTHER): Payer: BC Managed Care – PPO | Admitting: Family Medicine

## 2019-07-25 ENCOUNTER — Encounter (INDEPENDENT_AMBULATORY_CARE_PROVIDER_SITE_OTHER): Payer: Self-pay | Admitting: Family Medicine

## 2019-07-25 DIAGNOSIS — F3289 Other specified depressive episodes: Secondary | ICD-10-CM

## 2019-07-25 DIAGNOSIS — L89309 Pressure ulcer of unspecified buttock, unspecified stage: Secondary | ICD-10-CM | POA: Diagnosis not present

## 2019-07-25 DIAGNOSIS — E860 Dehydration: Secondary | ICD-10-CM | POA: Diagnosis not present

## 2019-07-25 DIAGNOSIS — Z6836 Body mass index (BMI) 36.0-36.9, adult: Secondary | ICD-10-CM | POA: Diagnosis not present

## 2019-07-25 DIAGNOSIS — E785 Hyperlipidemia, unspecified: Secondary | ICD-10-CM | POA: Diagnosis not present

## 2019-07-26 IMAGING — XA IR EXCHANGE NEPHROSTOMY RIGHT
1 series · 6 of 6 positions shown · non-contrast
Comparison: Multiple previous fluoroscopic guided exchanges, most
recently [DATE]/

INDICATION: History of endometrial cancer with bilateral obstructive uropathy
and chronic bilateral percutaneous nephrostomy catheters

catheter exchange.
EXAM:
FLUOROSCOPIC GUIDED BILATERAL SIDED NEPHROSTOMY CATHETER EXCHANGE
TECHNIQUE: Informed written consent was obtained from the patient after a
discussion of the risks, benefits and alternatives to treatment.
Questions regarding the procedure were encouraged and answered. A
timeout was performed prior to the initiation of the procedure.

[Series 300: ir nephrostomy exchange left · 6 of 6 slices shown]
[im 1/6]
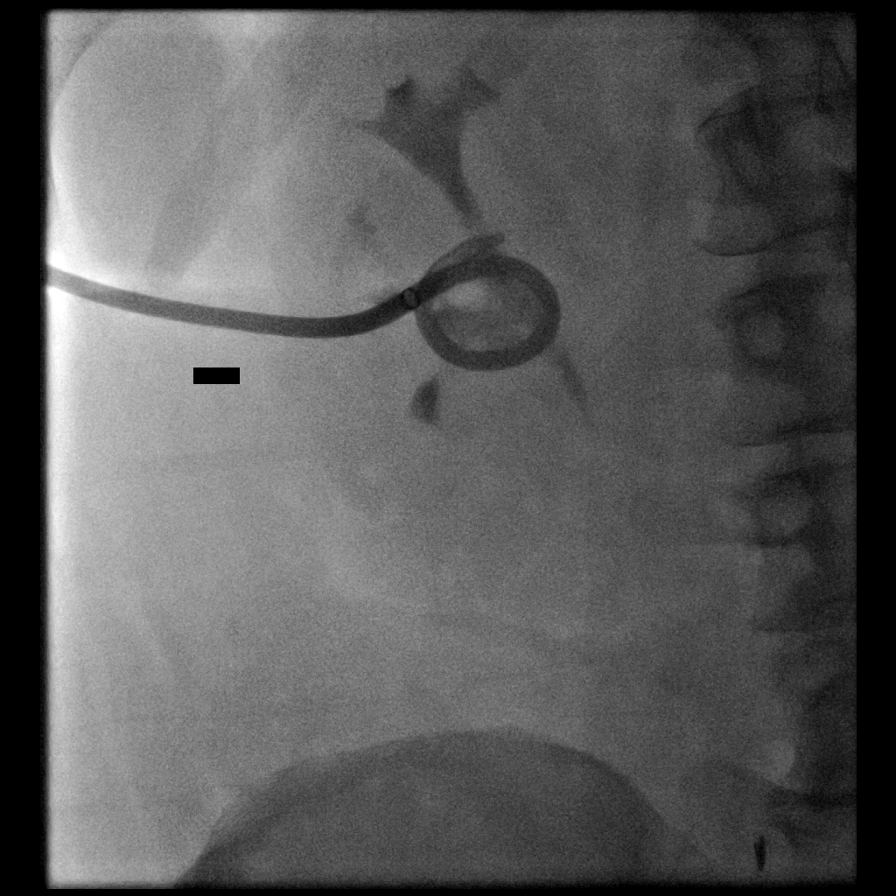
[im 2/6]
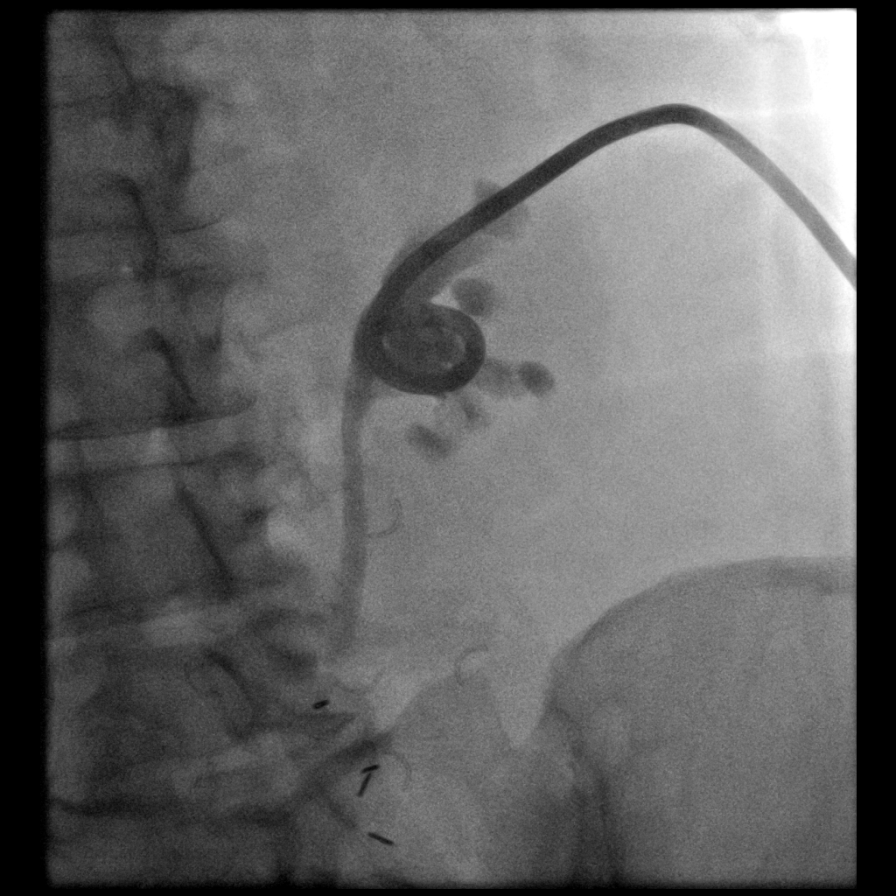
[im 3/6]
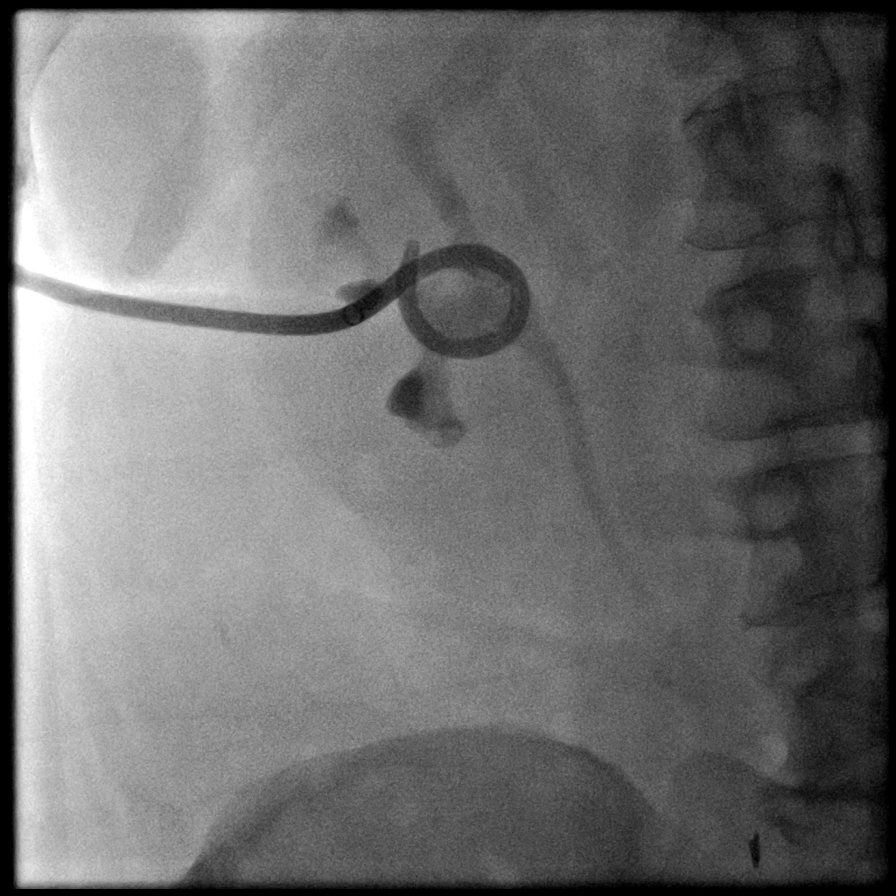
[im 4/6]
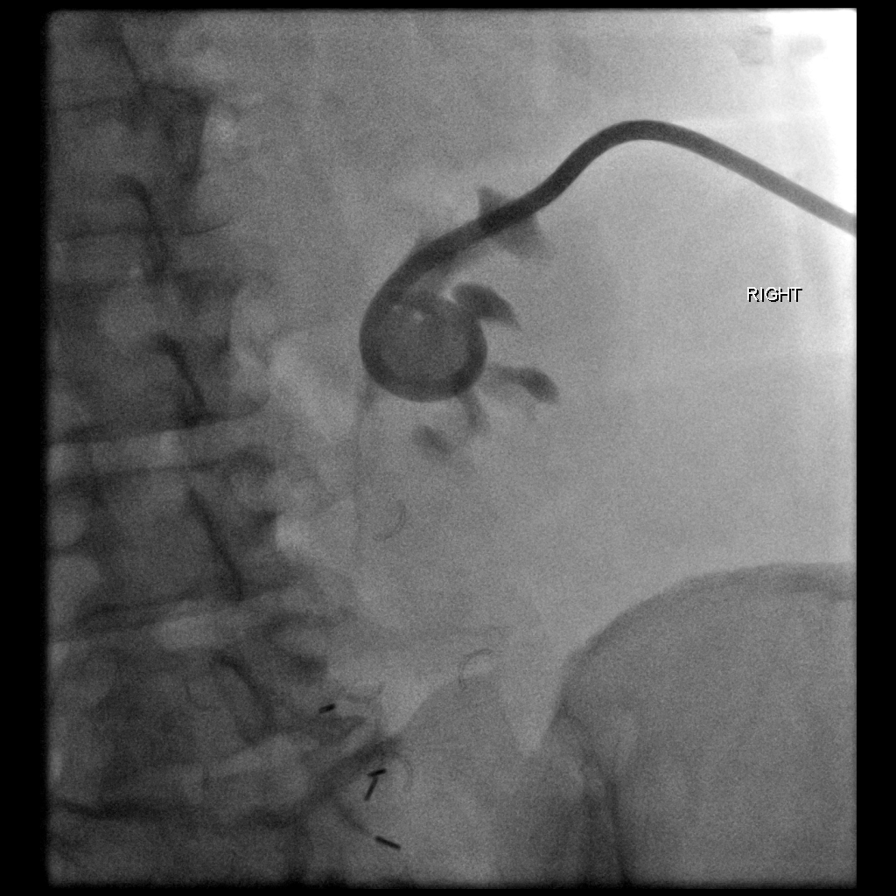
[im 5/6]
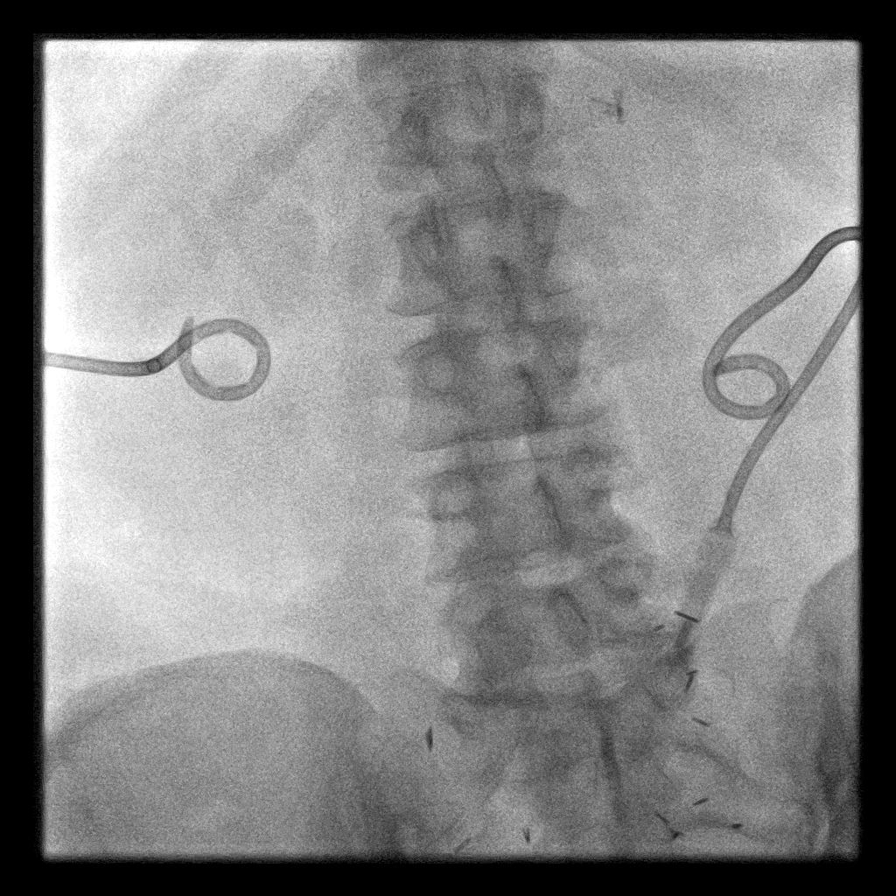
[im 6/6]
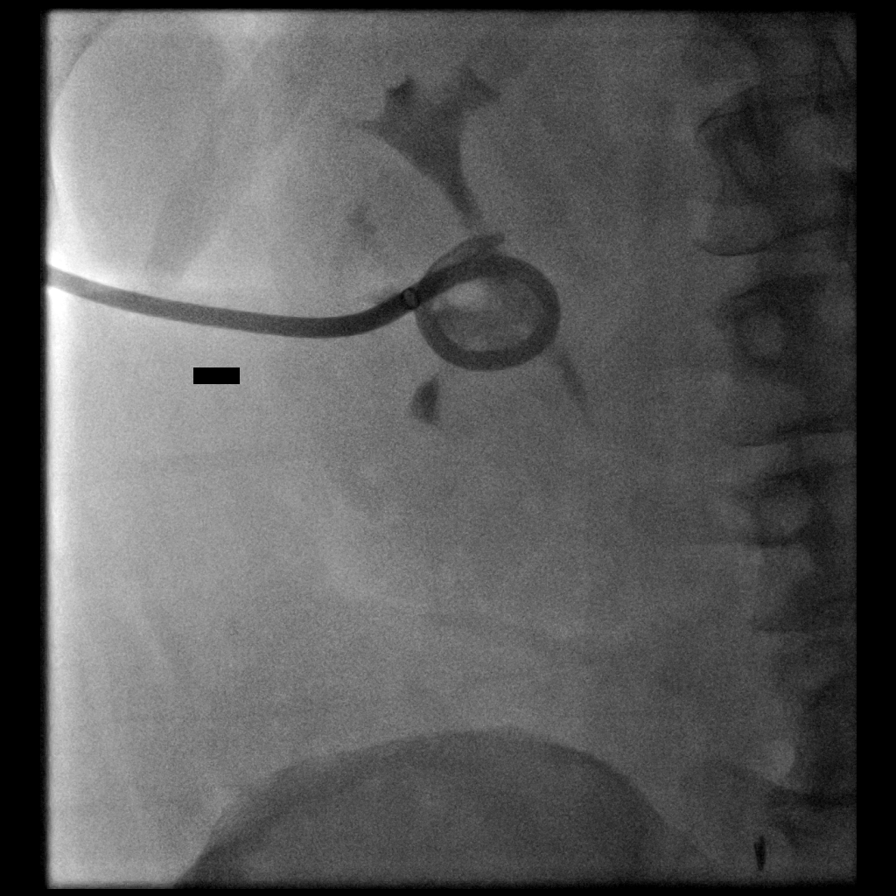

[6 of 6 positions shown; findings below may reference images not displayed]

CONTRAST:  A total of 10 mL Zsovue-CMM administered was administered
into both collecting systems

FLUOROSCOPY TIME:  1 minutes, 54 seconds (49 mGy)

COMPLICATIONS:
None immediate.
The bilateral flanks and external portions of existing nephrostomy
catheters were prepped and draped in the usual sterile fashion. A
sterile drape was applied covering the operative field. Maximum
barrier sterile technique with sterile gowns and gloves were used
for the procedure. A timeout was performed prior to the initiation
of the procedure.

A pre procedural spot fluoroscopic image was obtained. Beginning
with the left-sided nephrostomy, a small amount of contrast was
injected via the existing left-sided nephrostomy catheter
demonstrating appropriate positioning within the renal pelvis. The
existing nephrostomy catheter was cut and cannulated with a short
Amplatz wire which was coiled within the renal pelvis. Under
intermittent fluoroscopic guidance, the existing nephrostomy
catheter was exchanged for a new 12 French all-purpose drainage
catheter. Limited contrast injection confirmed appropriate
positioning within the left renal pelvis and a post exchange
fluoroscopic image was obtained. The catheter was locked and
reconnected to a gravity bag.

The identical repeat procedure was repeated for the contralateral
right-sided nephrostomy, ultimately allowing successful exchange of
a new 12 Nayza Cougo drainage catheter with end coiled and
locked within the right renal pelvis.

Dressings were placed. The patient tolerated the above procedures
well without immediate postprocedural complication.
FINDINGS: The existing nephrostomy catheters are appropriately positioned and
functioning.

After successful fluoroscopic guided exchange, new bilateral 12
French nephrostomy catheters are coiled and locked within the
respective renal pelvises.
IMPRESSION: Successful fluoroscopic guided exchange of bilateral 12 French
percutaneous nephrostomy catheters. Note, again, the left-sided
nephrostomy catheter is a standard all-purpose drainage catheter
while the right-sided percutaneous drainage catheter is [place] Joshjax
Aihara.

## 2019-07-27 DIAGNOSIS — E860 Dehydration: Secondary | ICD-10-CM | POA: Diagnosis not present

## 2019-07-27 DIAGNOSIS — L89309 Pressure ulcer of unspecified buttock, unspecified stage: Secondary | ICD-10-CM | POA: Diagnosis not present

## 2019-07-27 DIAGNOSIS — E785 Hyperlipidemia, unspecified: Secondary | ICD-10-CM | POA: Diagnosis not present

## 2019-07-30 DIAGNOSIS — L89309 Pressure ulcer of unspecified buttock, unspecified stage: Secondary | ICD-10-CM | POA: Diagnosis not present

## 2019-07-30 DIAGNOSIS — E785 Hyperlipidemia, unspecified: Secondary | ICD-10-CM | POA: Diagnosis not present

## 2019-07-30 DIAGNOSIS — E860 Dehydration: Secondary | ICD-10-CM | POA: Diagnosis not present

## 2019-08-01 DIAGNOSIS — E785 Hyperlipidemia, unspecified: Secondary | ICD-10-CM | POA: Diagnosis not present

## 2019-08-01 DIAGNOSIS — L89309 Pressure ulcer of unspecified buttock, unspecified stage: Secondary | ICD-10-CM | POA: Diagnosis not present

## 2019-08-01 DIAGNOSIS — E860 Dehydration: Secondary | ICD-10-CM | POA: Diagnosis not present

## 2019-08-01 DIAGNOSIS — Z20828 Contact with and (suspected) exposure to other viral communicable diseases: Secondary | ICD-10-CM | POA: Diagnosis not present

## 2019-08-03 DIAGNOSIS — L89309 Pressure ulcer of unspecified buttock, unspecified stage: Secondary | ICD-10-CM | POA: Diagnosis not present

## 2019-08-03 DIAGNOSIS — E860 Dehydration: Secondary | ICD-10-CM | POA: Diagnosis not present

## 2019-08-03 DIAGNOSIS — E785 Hyperlipidemia, unspecified: Secondary | ICD-10-CM | POA: Diagnosis not present

## 2019-08-06 NOTE — Progress Notes (Signed)
Office: [phone]  /  Fax: [phone] TeleHealth Visit:  [name] has verbally consented to this TeleHealth visit today. The patient is located at home, the provider is located at the [employer] [facility] office. The participants in this visit include the listed provider and patient and any and all parties involved. The visit was conducted today via FaceTime.  HPI:  Chief Complaint: OBESITY [name] is here to discuss her progress with her obesity treatment plan. She is on the keep a food journal of 1300 calories and 75 grams of protein daily plan and states she is following her eating plan approximately 60 % of the time. She states she is exercising 0 minutes 0 times per week.  [name] has continued to struggle with health issues since our last visit and feels she may have increased comfort eating, and she thinks she has gained 1 to 2 pounds. She hasn't been meeting her protein goals as regularly and she has been feeling very tired.  Emotional Eating [name]'s mood is low, due to her multiple health issues, which she had hoped would be more improved by now. She has done some stress and comfort eating, but she has tried to be mindful.  ASSESSMENT AND PLAN:  Other depression, emotional eating   Class 2 severe obesity with serious comorbidity and body mass index (BMI) of 36.0 to 36.9 in adult, unspecified obesity type [facility])  PLAN:  Emotional Eating Emotional eating strategies were discussed today to help [name] deal with her emotional/non-hunger eating behaviors. Patient was offered encouragement and support. Orders and follow up as documented in patient record.   Time 4 We spent > 50% of today's 26 minute visit on counseling and coordination of care documented in the above note.  Obesity [name] is currently in the action stage of change. As such, her goal is to continue with weight loss efforts. She has agreed to keep a food journal and adhere to recommended goals of  1300 calories and 75 grams of protein (+30 grams of protein in protein drink). [name] has been instructed to work up to a goal of 150 minutes of combined cardio and strengthening exercise per week for weight loss and overall health benefits. We discussed the following Behavioral Modification Strategies today: increasing lean protein intake, emotional eating strategies and holiday eating strategies. Patient goal is to maintain weight over Christmas and add a premier protein drink, in addition to her regular protein goals.  [name] has agreed to follow-up with our clinic in 3 weeks. She was informed of the importance of frequent follow-up visits to maximize her success with intensive lifestyle modifications for her multiple health conditions.  ALLERGIES: Allergies  Allergen Reactions  . Penicillins Swelling    Facial swelling/childhood allergy Has patient had a PCN reaction causing immediate rash, facial/tongue/throat swelling, SOB or lightheadedness with hypotension: Yes Has patient had a PCN reaction causing severe rash involving mucus membranes or skin necrosis: Yes Has patient had a PCN reaction that required hospitalization yes Has patient had a PCN reaction occurring within the last 10 years: No If all of the above answers are "NO", then may proceed with Cephalosporin use.   . Cefaclor Rash    Ceclor  . Erythromycin Other (See Comments)    Gastritis, abd cramps  . Tape Rash    blisters  . Trimethoprim Rash  . Ultram [Tramadol] Hives  . Cephalosporins Rash  . Fluconazole Rash  . Oxycodone Other (See Comments)    " I just feel  weird"  . Pectin Rash    Pectin ring for stoma  . Septra [Sulfamethoxazole-Trimethoprim] Rash  . Sulfa Antibiotics Rash    MEDICATIONS: Current Outpatient Medications on File Prior to Visit  Medication Sig Dispense Refill  . acetaminophen (TYLENOL) 325 MG tablet Take 2 tablets (650 mg total) by mouth every 6 (six) hours as needed. 30 tablet 1  .  anastrozole (ARIMIDEX) 1 MG tablet Take 1 tablet (1 mg total) by mouth daily. 90 tablet 3  . Biotin 5 MG TABS Take 5 mg by mouth daily.     . Cholecalciferol (VITAMIN D3) 10000 UNITS capsule Take 10,000 Units by mouth every Sunday.     . clobetasol (OLUX) 0.05 % topical foam Apply topically 2 (two) times daily.    . diphenhydrAMINE (BENADRYL) 25 mg capsule Take 1 capsule (25 mg total) by mouth every 8 (eight) hours as needed for itching, allergies or sleep. 30 capsule 0  . fentaNYL (DURAGESIC) 12 MCG/HR Place 1 patch onto the skin every 3 (three) days. 10 patch 0  . ferrous sulfate 325 (65 FE) MG tablet Take 1 tablet (325 mg total) by mouth at bedtime. 30 tablet 3  . filgrastim-sndz (ZARXIO) 480 MCG/0.8ML SOSY injection Inject 0.8 mLs (480 mcg total) into the skin as directed. Inject 480 mcg every 5 days 0.8 mL 99  . JANUVIA 50 MG tablet Take 50 mg by mouth daily.     [name] Kitchen levothyroxine (SYNTHROID, LEVOTHROID) 150 MCG tablet Take 1 tablet (150 mcg total) by mouth daily before breakfast. 30 tablet 1  . loratadine (CLARITIN) 10 MG tablet Take 10 mg by mouth daily.     [name] Kitchen omega-3 acid ethyl esters (LOVAZA) 1 G capsule Take 1 g by mouth 2 (two) times daily.     Vladimir Faster Glycol-Propyl Glycol (SYSTANE OP) Place 1 drop into both eyes daily as needed (dry eyes).     . pregabalin (LYRICA) 50 MG capsule Take 1 capsule (50 mg total) by mouth 2 (two) times daily. 60 capsule 2  . Prenatal Vit-Fe Fumarate-FA (PRENATAL VITAMIN PO) Take 1 capsule by mouth daily. Takes prenatal because there are no dyes in it    . Probiotic Product (ALIGN PO) Take by mouth 2 (two) times daily.    . rosuvastatin (CRESTOR) 10 MG tablet Take 10 mg by mouth every evening.     . Saccharomyces boulardii (FLORASTOR PO) Take 1 capsule by mouth daily.    . nitrofurantoin, macrocrystal-monohydrate, (MACROBID) 100 MG capsule Take 100 mg by mouth at bedtime.     No current facility-administered medications on file prior to visit.    PAST  MEDICAL HISTORY: Past Medical History:  Diagnosis Date  . Adrenal adenoma, left 02/08/2016   CT: stable benign  . Anemia in neoplastic disease   . Back pain   . Benign essential HTN   . 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 Cancer of corpus uteri, except isthmus [facility])  oncologist-- dr Denman [name] and dr Alvy Bimler    10-15-2004  dx endometroid endometrial and ovarian cancer s/p  chemotheapy and surgery(TAH w/ BSO) :  recurrent 11-19-2014 post pelvic surgery and radiation 01-29-2015 to 03-10-2015  . Chronic idiopathic neutropenia (HCC)    presumed related to chemotherapy March 2006--- followed by dr Alvy Bimler (treatment w/ G-CSF injections  . Chronic nausea   .  Chronic pain    perineal/ anal  area from bladder pad irritates skin , right flank pain  . CKD stage G2/A3, GFR 60-89 and albumin creatinine ratio >300 mg/g    nephrologist-  dr Madelon Lips  . Colovesical fistula   . Diabetic retinopathy, background ([place])   . Difficult intravenous access    small veins--- hx PICC lines  . DM type 2 (diabetes mellitus, type 2) ([place])    monitored by dr Legrand Como altheimer  . Dysuria   . Environmental and seasonal allergies   . Fatty liver 02/08/2016   CT  . Generalized muscle weakness   . GERD (gastroesophageal reflux disease)   . Hiatal hernia   . History of abdominal abscess 04/16/2017   post surgery 04-01-2017  --- resolved 10/ 2018  . History of gastric polyp    2014  duodenum  . History of ileus 04/16/2017   resolved w/ no surgical intervention  . History of radiation therapy    01-29-2015 to 03-10-2015  pelvis 50.4Gy  . Hypothyroidism    monitored by dr Legrand Como altheimer  . IBS (irritable bowel syndrome)   . Ileostomy in place [facility]) 04/01/2017   created at same time colostomy takedown.  . Joint pain    . Leg edema   . Lower urinary tract symptoms (LUTS)    urge urinary  incontinence  . Mixed dyslipidemia   . Multiple thyroid nodules    Managed by Dr. Harlow Asa  . Nephrostomy status ([place])   . Palpitations   . Pelvic abscess in female 04/16/2017  . PONV (postoperative nausea and vomiting)    "scopolamine patch works for me"  . Radiation-induced dermatitis    contact dermatitis , radiation completed, rash only on ankles now.  . SBO (small bowel obstruction) ([place]) 01/2019  . Seasonal allergies   . Ureteral stricture, right UROLOGIT-  DR [facility]   CHRONIC--  TREATMENT URETERAL STENT  . 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 Vitamin D deficiency   . Wears glasses     PAST SURGICAL HISTORY: Past Surgical History:  Procedure Laterality Date  . APPENDECTOMY    . biopsy thyroid nodules    . BREAST RECONSTRUCTION WITH PLACEMENT OF TISSUE EXPANDER AND FLEX HD (ACELLULAR HYDRATED DERMIS) Bilateral 12/11/2015   Procedure: BILATERAL BREAST RECONSTRUCTION WITH PLACEMENT OF TISSUE EXPANDERS;  Surgeon: Irene Limbo, MD;  Location: [place];  Service: Plastics;  Laterality: Bilateral;  . COLONOSCOPY WITH PROPOFOL N/A 08/21/2013   Procedure: COLONOSCOPY WITH PROPOFOL;  Surgeon: Cleotis Nipper, MD;  [facility];  Service: Endoscopy;  Laterality: N/A;  . COLOSTOMY TAKEDOWN N/A 12/04/2014   Procedure: LAPROSCOPIC LYSIS OF ADHESIONS, SPLENIC MOBILIZATION, RELOCATION OF COLOSTOMY, DEBRIDEMENT INITIAL COLOSTOMY SITE;  Surgeon: Michael [name], MD;  [facility];  Service: General;  Laterality: N/A;  . CYSTOGRAM N/A 06/01/2017   Procedure: CYSTOGRAM;  Surgeon: Alexis Frock, MD;  [facility];  Service: Urology;  Laterality: N/A;  . CYSTOSCOPY W/ RETROGRADES Right 11/21/2015   Procedure: CYSTOSCOPY WITH RETROGRADE PYELOGRAM;  Surgeon: Alexis Frock, MD;  [facility];  Service: Urology;  Laterality: Right;  . CYSTOSCOPY W/ URETERAL STENT PLACEMENT Right 11/21/2015   Procedure:  CYSTOSCOPY WITH STENT REPLACEMENT;  Surgeon: Alexis Frock, MD;  [facility];  Service: Urology;  Laterality: Right;  . CYSTOSCOPY W/ URETERAL STENT PLACEMENT Right 03/10/2016   Procedure: CYSTOSCOPY WITH STENT REPLACEMENT;  Surgeon: Alexis Frock, MD;  Location[facility];  Service: Urology;  Laterality: Right;  .  CYSTOSCOPY W/ URETERAL STENT PLACEMENT Right 06/30/2016   Procedure: CYSTOSCOPY WITH RETROGRADE PYELOGRAM/URETERAL STENT EXCHANGE;  Surgeon: Alexis Frock, MD;  Location: [facility];  Service: Urology;  Laterality: Right;  . CYSTOSCOPY W/ URETERAL STENT PLACEMENT N/A 06/01/2017   Procedure: CYSTOSCOPY WITH EXAM UNDER ANESTHESIA;  Surgeon: Alexis Frock, MD;  [facility];  Service: Urology;  Laterality: N/A;  . CYSTOSCOPY W/ URETERAL STENT PLACEMENT Right 08/17/2017   Procedure: CYSTOSCOPY WITH RETROGRADE PYELOGRAM/URETERAL STENT REMOVAL;  Surgeon: Alexis Frock, MD;  Location: [facility];  Service: Urology;  Laterality: Right;  . CYSTOSCOPY WITH RETROGRADE PYELOGRAM, URETEROSCOPY AND STENT PLACEMENT Right 03/20/2015   Procedure: CYSTOSCOPY WITH RETROGRADE PYELOGRAM, URETEROSCOPY WITH BALLOON DILATION AND STENT PLACEMENT ON RIGHT;  Surgeon: Alexis Frock, MD;  Location: [facility];  Service: Urology;  Laterality: Right;  . CYSTOSCOPY WITH RETROGRADE PYELOGRAM, URETEROSCOPY AND STENT PLACEMENT Right 05/02/2015   Procedure: CYSTOSCOPY WITH RIGHT RETROGRADE PYELOGRAM,  DIAGNOSTIC URETEROSCOPY AND STENT PULL ;  Surgeon: Alexis Frock, MD;  Location: [facility];  Service: Urology;  Laterality: Right;  . CYSTOSCOPY WITH RETROGRADE PYELOGRAM, URETEROSCOPY AND STENT PLACEMENT Right 09/05/2015   Procedure: CYSTOSCOPY WITH RETROGRADE PYELOGRAM,  AND STENT PLACEMENT;  Surgeon: Alexis Frock, MD;  [facility];  Service: Urology;  Laterality: Right;  . CYSTOSCOPY WITH RETROGRADE PYELOGRAM, URETEROSCOPY AND  STENT PLACEMENT Right 04/01/2017   Procedure: CYSTOSCOPY WITH RETROGRADE PYELOGRAM, URETEROSCOPY AND STENT PLACEMENT;  Surgeon: Alexis Frock, MD;  [facility];  Service: Urology;  Laterality: Right;  . CYSTOSCOPY WITH STENT PLACEMENT Right 10/27/2016   Procedure: CYSTOSCOPY WITH STENT CHANGE and right retrograde pyelogram;  Surgeon: Alexis Frock, MD;  Location: [facility];  Service: Urology;  Laterality: Right;  . EUS N/A 10/02/2014   Procedure: LOWER ENDOSCOPIC ULTRASOUND (EUS);  Surgeon: Arta Silence, MD;  Location: [facility];  Service: Endoscopy;  Laterality: N/A;  . EXCISION SOFT TISSUE MASS RIGHT FOREMAN  12-08-2006  . EYE SURGERY  as child   pytosis of eyelids repair  . INCISION AND DRAINAGE OF WOUND Bilateral 12/26/2015   Procedure: DEBRIDEMENT OF BILATERAL MASTECTOMY FLAPS;  Surgeon: Irene Limbo, MD;  Location: [place];  Service: Plastics;  Laterality: Bilateral;  . IR CV LINE INJECTION  05/31/2017  . IR FLUORO GUIDE CV LINE LEFT  05/31/2017  . IR FLUORO GUIDE CV LINE RIGHT  04/06/2017  . IR FLUORO GUIDE CV MIDLINE PICC RIGHT  05/30/2017  . IR NEPHROSTOGRAM LEFT INITIAL PLACEMENT  09/02/2017  . IR NEPHROSTOGRAM LEFT THRU EXISTING ACCESS  11/29/2017  . IR NEPHROSTOGRAM RIGHT INITIAL PLACEMENT  09/02/2017  . IR NEPHROSTOGRAM RIGHT THRU EXISTING ACCESS  09/13/2017  . IR NEPHROSTOGRAM RIGHT THRU EXISTING ACCESS  11/29/2017  . IR NEPHROSTOMY EXCHANGE LEFT  11/28/2017  . IR NEPHROSTOMY EXCHANGE LEFT  01/05/2018  . IR NEPHROSTOMY EXCHANGE LEFT  02/16/2018  . IR NEPHROSTOMY EXCHANGE LEFT  03/30/2018  . IR NEPHROSTOMY EXCHANGE LEFT  05/12/2018  . IR NEPHROSTOMY EXCHANGE LEFT  06/21/2018  . IR NEPHROSTOMY EXCHANGE LEFT  08/04/2018  . IR NEPHROSTOMY EXCHANGE LEFT  09/18/2018  . IR NEPHROSTOMY EXCHANGE LEFT  10/09/2018  . IR NEPHROSTOMY EXCHANGE LEFT  10/27/2018  . IR NEPHROSTOMY EXCHANGE LEFT  11/21/2018  . IR NEPHROSTOMY EXCHANGE LEFT  01/05/2019  . IR  NEPHROSTOMY EXCHANGE LEFT  02/15/2019  . IR NEPHROSTOMY EXCHANGE LEFT  03/29/2019  . IR NEPHROSTOMY EXCHANGE RIGHT  10/02/2017  . IR NEPHROSTOMY EXCHANGE RIGHT  11/28/2017  . IR NEPHROSTOMY EXCHANGE RIGHT  01/05/2018  . IR NEPHROSTOMY EXCHANGE RIGHT  02/16/2018  . IR NEPHROSTOMY EXCHANGE RIGHT  03/30/2018  . IR NEPHROSTOMY EXCHANGE RIGHT  05/12/2018  . IR NEPHROSTOMY EXCHANGE RIGHT  06/21/2018  . IR NEPHROSTOMY EXCHANGE RIGHT  08/04/2018  . IR NEPHROSTOMY EXCHANGE RIGHT  09/18/2018  . IR NEPHROSTOMY EXCHANGE RIGHT  10/27/2018  . IR NEPHROSTOMY EXCHANGE RIGHT  11/21/2018  . IR NEPHROSTOMY EXCHANGE RIGHT  01/05/2019  . IR NEPHROSTOMY EXCHANGE RIGHT  02/15/2019  . IR NEPHROSTOMY EXCHANGE RIGHT  03/29/2019  . IR NEPHROSTOMY PLACEMENT LEFT  10/02/2017  . IR RADIOLOGIST EVAL & MGMT  05/03/2017  . IR US GUIDE VASC ACCESS LEFT  05/31/2017  . IR US GUIDE VASC ACCESS RIGHT  04/06/2017  . IR US GUIDE VASC ACCESS RIGHT  05/30/2017  . LAPAROSCOPIC CHOLECYSTECTOMY  1990  . LIPOSUCTION WITH LIPOFILLING Bilateral 04/16/2016   Procedure: LIPOSUCTION WITH LIPOFILLING TO BILATERAL CHEST;  Surgeon: Irene Limbo, MD;  Location: [place];  Service: Plastics;  Laterality: Bilateral;  . MASTECTOMY W/ SENTINEL NODE BIOPSY Bilateral 12/11/2015   Procedure: RIGHT PROPHYLACTIC MASTECTOMY, LEFT TOTAL MASTECTOMY WITH LEFT AXILLARY SENTINEL LYMPH NODE BIOPSY;  Surgeon: Stark Klein, MD;  Location: [place];  Service: General;  Laterality: Bilateral;  . OSTOMY N/A 11/19/2014   Procedure: OSTOMY;  Surgeon: Michael [name], MD;  [facility];  Service: General;  Laterality: N/A;  . PROCTOSCOPY N/A 04/01/2017   Procedure: RIDGE PROCTOSCOPY;  Surgeon: Michael [name], MD;  [facility];  Service: General;  Laterality: N/A;  . REMOVAL OF BILATERAL TISSUE EXPANDERS WITH PLACEMENT OF BILATERAL BREAST IMPLANTS Bilateral 04/16/2016   Procedure: REMOVAL OF BILATERAL TISSUE EXPANDERS WITH PLACEMENT OF BILATERAL BREAST IMPLANTS;   Surgeon: Irene Limbo, MD;  Location: [place];  Service: Plastics;  Laterality: Bilateral;  . ROBOTIC ASSISTED LAP VAGINAL HYSTERECTOMY N/A 11/19/2014   Procedure: ROBOTIC LYSIS OF ADHESIONS, CONVERTED TO LAPAROTOMY RADICAL UPPER VAGINECTOMY,LOW ANTERIOR BOWEL RESECTION, COLOSTOMY, BILATERAL URETERAL STENT PLACEMENT AND CYSTONOMY CLOSURE;  Surgeon: Everitt Amber, MD;  [facility];  Service: Gynecology;  Laterality: N/A;  . TISSUE EXPANDER FILLING Bilateral 12/26/2015   Procedure: EXPANSION OF BILATERAL CHEST TISSUE EXPANDERS (60 mL- Right; 75 mL- Left);  Surgeon: Irene Limbo, MD;  Location: [place];  Service: Plastics;  Laterality: Bilateral;  . TONSILLECTOMY    . TOTAL ABDOMINAL HYSTERECTOMY  March 2006   [facility]   and Bilateral Salpingoophorectomy/  staging for Ovarian cancer/  an  . XI ROBOTIC ASSISTED LOWER ANTERIOR RESECTION N/A 04/01/2017   Procedure: XI ROBOTIC VS LAPAROSCOPIC COLOSTOMY TAKEDOWN WITH LYSIS OF ADHESIONS.;  Surgeon: Michael [name], MD;  [facility];  Service: General;  Laterality: N/A;  ERAS PATHWAY    SOCIAL HISTORY: Social History   Tobacco Use  . Smoking status: Never Smoker  . Smokeless tobacco: Never Used  Substance Use Topics  . Alcohol use: Not Currently  . Drug use: No    FAMILY HISTORY: Family History  Problem Relation Age of Onset  . Cancer Mother 42       stomach ca  . Hypertension Mother   . Cancer Father 74       prostate ca  . Diabetes Father   . Heart disease Father        CABG  . Hypertension Father   . Hyperlipidemia Father   . Obesity Father   . Breast cancer Maternal Aunt  dx in her 63s  . Lymphoma Paternal Aunt   . Brain cancer Paternal Grandfather   . Ovarian cancer Other   . Diabetes Sister   . Hypertension Brother y-10  . Heart disease Brother        CABG  . Diabetes Brother     ROS: Review of Systems  Constitutional: Negative for weight loss.  Psychiatric/Behavioral:  Positive for depression.    PHYSICAL EXAM: There were no vitals taken for this visit. There is no height or weight on file to calculate BMI. Physical Exam Vitals reviewed.  Constitutional:      General: She is not in acute distress.    Appearance: Normal appearance. She is well-developed. She is obese.  Cardiovascular:     Rate and Rhythm: Normal rate.  Pulmonary:     Effort: Pulmonary effort is normal.  Musculoskeletal:        General: Normal range of motion.  Skin:    General: Skin is warm and dry.  Neurological:     Mental Status: She is alert and oriented to person, place, and time.  Psychiatric:        Mood and Affect: Mood normal.        Behavior: Behavior normal.     RECENT LABS AND TESTS: BMET    Component Value Date/Time   NA 138 03/29/2019 1143   NA 142 11/29/2018 0000   NA 141 01/24/2017 1228   K 4.1 03/29/2019 1143   K 4.6 01/24/2017 1228   CL 105 03/29/2019 1143   CO2 24 03/29/2019 1143   CO2 29 01/24/2017 1228   GLUCOSE 111 (H) 03/29/2019 1143   GLUCOSE 84 01/24/2017 1228   BUN 28 (H) 03/29/2019 1143   BUN 22 (A) 11/29/2018 0000   BUN 22.2 01/24/2017 1228   CREATININE 1.56 (H) 03/29/2019 1143   CREATININE 1.98 (H) 03/27/2018 1324   CREATININE 0.8 01/24/2017 1228   CALCIUM 9.4 03/29/2019 1143   CALCIUM 10.2 01/24/2017 1228   GFRNONAA 35 (L) 03/29/2019 1143   GFRNONAA 26 (L) 03/27/2018 1324   GFRNONAA 78 12/16/2014 1530   GFRAA 40 (L) 03/29/2019 1143   GFRAA 30 (L) 03/27/2018 1324   GFRAA >89 12/16/2014 1530   Lab Results  Component Value Date   HGBA1C 7.9 (H) 09/01/2017   HGBA1C 5.3 04/01/2017   HGBA1C 5.8 (H) 09/05/2015   HGBA1C 6.8 (H) 11/21/2014   HGBA1C 6.3 (H) 11/07/2014   No results found for: INSULIN CBC    Component Value Date/Time   WBC 3.1 (L) 03/29/2019 1143   RBC 3.69 (L) 03/29/2019 1143   HGB 11.0 (L) 03/29/2019 1143   HGB 10.2 (L) 03/27/2018 1324   HGB 12.4 07/29/2017 1444   HCT 36.0 03/29/2019 1143   HCT 38.0  07/29/2017 1444   PLT 210 03/29/2019 1143   PLT 268 03/27/2018 1324   PLT 260 07/29/2017 1444   MCV 97.6 03/29/2019 1143   MCV 90.9 07/29/2017 1444   MCH 29.8 03/29/2019 1143   MCHC 30.6 03/29/2019 1143   RDW 15.1 03/29/2019 1143   RDW 15.5 (H) 07/29/2017 1444   LYMPHSABS 0.7 02/15/2019 1312   LYMPHSABS 0.8 (L) 07/29/2017 1444   MONOABS 0.7 02/15/2019 1312   MONOABS 1.0 (H) 07/29/2017 1444   EOSABS 0.2 02/15/2019 1312   EOSABS 0.0 07/29/2017 1444   BASOSABS 0.0 02/15/2019 1312   BASOSABS 0.0 07/29/2017 1444   Iron/TIBC/Ferritin/ %Sat    Component Value Date/Time   IRON 7 (L) 08/31/2017  0451   IRON 12 (L) 11/29/2014 1251   TIBC 164 (L) 08/31/2017 0451   TIBC 331 11/29/2014 1251   FERRITIN 27 11/29/2014 1251   IRONPCTSAT 4 (L) 08/31/2017 0451   IRONPCTSAT 4 (L) 11/29/2014 1251   Lipid Panel     Component Value Date/Time   CHOL 109 08/28/2015 1617   TRIG 89 04/18/2017 0427   HDL 43.80 08/28/2015 1617   CHOLHDL 2 08/28/2015 1617   VLDL 19.8 08/28/2015 1617   LDLCALC 45 08/28/2015 1617   Hepatic Function Panel     Component Value Date/Time   PROT 7.4 01/17/2019 0351   PROT 6.9 01/24/2017 1228   ALBUMIN 2.4 (L) 01/22/2019 0343   ALBUMIN 3.4 (L) 01/24/2017 1228   AST 24 01/17/2019 0351   AST 14 (L) 03/27/2018 1324   AST 16 01/24/2017 1228   ALT 53 (H) 01/17/2019 0351   ALT 18 03/27/2018 1324   ALT 14 01/24/2017 1228   ALKPHOS 305 (H) 01/17/2019 0351   ALKPHOS 90 01/24/2017 1228   BILITOT 0.6 01/17/2019 0351   BILITOT 0.2 (L) 03/27/2018 1324   BILITOT 0.34 01/24/2017 1228      Component Value Date/Time   TSH 2.408 08/30/2017 0623   TSH 0.63 08/28/2015 1617     I, Doreene Nest, am acting as Location manager for Dennard Nip, MD I have reviewed the above documentation for accuracy and completeness, and I agree with the above. -Dennard Nip, MD

## 2019-08-07 DIAGNOSIS — K080 Exfoliation of teeth due to systemic causes: Secondary | ICD-10-CM | POA: Diagnosis not present

## 2019-08-08 DIAGNOSIS — R140 Abdominal distension (gaseous): Secondary | ICD-10-CM | POA: Diagnosis not present

## 2019-08-08 DIAGNOSIS — A049 Bacterial intestinal infection, unspecified: Secondary | ICD-10-CM | POA: Diagnosis not present

## 2019-08-09 DIAGNOSIS — N189 Chronic kidney disease, unspecified: Secondary | ICD-10-CM | POA: Diagnosis not present

## 2019-08-09 DIAGNOSIS — Z8543 Personal history of malignant neoplasm of ovary: Secondary | ICD-10-CM | POA: Diagnosis not present

## 2019-08-09 DIAGNOSIS — E1140 Type 2 diabetes mellitus with diabetic neuropathy, unspecified: Secondary | ICD-10-CM | POA: Diagnosis not present

## 2019-08-09 DIAGNOSIS — Z79899 Other long term (current) drug therapy: Secondary | ICD-10-CM | POA: Diagnosis not present

## 2019-08-09 DIAGNOSIS — K604 Rectal fistula: Secondary | ICD-10-CM | POA: Diagnosis not present

## 2019-08-09 DIAGNOSIS — Z792 Long term (current) use of antibiotics: Secondary | ICD-10-CM | POA: Diagnosis not present

## 2019-08-09 DIAGNOSIS — N321 Vesicointestinal fistula: Secondary | ICD-10-CM | POA: Diagnosis not present

## 2019-08-09 DIAGNOSIS — Z9181 History of falling: Secondary | ICD-10-CM | POA: Diagnosis not present

## 2019-08-09 DIAGNOSIS — E1122 Type 2 diabetes mellitus with diabetic chronic kidney disease: Secondary | ICD-10-CM | POA: Diagnosis not present

## 2019-08-09 DIAGNOSIS — Z452 Encounter for adjustment and management of vascular access device: Secondary | ICD-10-CM | POA: Diagnosis not present

## 2019-08-09 DIAGNOSIS — Z8542 Personal history of malignant neoplasm of other parts of uterus: Secondary | ICD-10-CM | POA: Diagnosis not present

## 2019-08-09 DIAGNOSIS — Z5181 Encounter for therapeutic drug level monitoring: Secondary | ICD-10-CM | POA: Diagnosis not present

## 2019-08-09 DIAGNOSIS — K219 Gastro-esophageal reflux disease without esophagitis: Secondary | ICD-10-CM | POA: Diagnosis not present

## 2019-08-09 DIAGNOSIS — E039 Hypothyroidism, unspecified: Secondary | ICD-10-CM | POA: Diagnosis not present

## 2019-08-14 DIAGNOSIS — K219 Gastro-esophageal reflux disease without esophagitis: Secondary | ICD-10-CM | POA: Diagnosis not present

## 2019-08-14 DIAGNOSIS — Z9181 History of falling: Secondary | ICD-10-CM | POA: Diagnosis not present

## 2019-08-14 DIAGNOSIS — Z5181 Encounter for therapeutic drug level monitoring: Secondary | ICD-10-CM | POA: Diagnosis not present

## 2019-08-14 DIAGNOSIS — Z8542 Personal history of malignant neoplasm of other parts of uterus: Secondary | ICD-10-CM | POA: Diagnosis not present

## 2019-08-14 DIAGNOSIS — Z8543 Personal history of malignant neoplasm of ovary: Secondary | ICD-10-CM | POA: Diagnosis not present

## 2019-08-14 DIAGNOSIS — N321 Vesicointestinal fistula: Secondary | ICD-10-CM | POA: Diagnosis not present

## 2019-08-14 DIAGNOSIS — E1122 Type 2 diabetes mellitus with diabetic chronic kidney disease: Secondary | ICD-10-CM | POA: Diagnosis not present

## 2019-08-14 DIAGNOSIS — E1140 Type 2 diabetes mellitus with diabetic neuropathy, unspecified: Secondary | ICD-10-CM | POA: Diagnosis not present

## 2019-08-14 DIAGNOSIS — N189 Chronic kidney disease, unspecified: Secondary | ICD-10-CM | POA: Diagnosis not present

## 2019-08-14 DIAGNOSIS — E039 Hypothyroidism, unspecified: Secondary | ICD-10-CM | POA: Diagnosis not present

## 2019-08-14 DIAGNOSIS — Z79899 Other long term (current) drug therapy: Secondary | ICD-10-CM | POA: Diagnosis not present

## 2019-08-14 DIAGNOSIS — K604 Rectal fistula: Secondary | ICD-10-CM | POA: Diagnosis not present

## 2019-08-14 DIAGNOSIS — Z452 Encounter for adjustment and management of vascular access device: Secondary | ICD-10-CM | POA: Diagnosis not present

## 2019-08-15 ENCOUNTER — Encounter (INDEPENDENT_AMBULATORY_CARE_PROVIDER_SITE_OTHER): Payer: Self-pay | Admitting: Family Medicine

## 2019-08-15 ENCOUNTER — Other Ambulatory Visit: Payer: Self-pay

## 2019-08-15 ENCOUNTER — Telehealth (INDEPENDENT_AMBULATORY_CARE_PROVIDER_SITE_OTHER): Payer: BC Managed Care – PPO | Admitting: Family Medicine

## 2019-08-15 DIAGNOSIS — Z6835 Body mass index (BMI) 35.0-35.9, adult: Secondary | ICD-10-CM | POA: Diagnosis not present

## 2019-08-15 DIAGNOSIS — F4323 Adjustment disorder with mixed anxiety and depressed mood: Secondary | ICD-10-CM | POA: Diagnosis not present

## 2019-08-16 NOTE — Progress Notes (Signed)
TeleHealth Visit:  Due to the COVID-19 pandemic, this visit was completed with telemedicine (audio/video) technology to reduce patient and provider exposure as well as to preserve personal protective equipment.   [name] has verbally consented to this TeleHealth visit. The patient is located at home, the provider is located at the [facility] office. The participants in this visit include the listed provider and patient. Trinity was unable to use realtime audiovisual technology today and the telehealth visit was conducted via telephone.  Chief Complaint: OBESITY [name] is here to discuss her progress with her obesity treatment plan along with follow-up of her obesity related diagnoses. [name] is keeping a food journal and adhering to recommended goals of 1300 calories and 75 grams of protein daily and states she is following her eating plan approximately 60-70% of the time. [name] states she is moving more around the house.  Today's visit was #: 25 Starting weight: 208 lbs Starting date: 04/05/18  Interim History: [name] feels she has done well maintaining her weight even over the holidays. She had her pic line removed recently and feels well overall.  Subjective:   1. Adjustment disorder with mixed anxiety and depressed mood [name] feels her mood has improved overall, and she is feeling hope for the future. She is still anxious about COVID19 and risk of infection.  Assessment/Plan:   1. Adjustment disorder with mixed anxiety and depressed mood [name] was offered support and reassurance. We will continue to monitor closely, but no changes for now.  2. Class 2 severe obesity with serious comorbidity and body mass index (BMI) of 35.0 to 35.9 in adult, unspecified obesity type (HCC) [name] is currently in the action stage of change. As such, her goal is to continue with weight loss efforts. She has agreed to keeping a food journal and adhering to recommended  goals of 1200-1400 calories and 75 grams of protein daily.   We discussed the following behavioral modification strategies today: increasing lean protein intake.  [name] Pluck Hutzler has agreed to follow-up with our clinic in 3 weeks. She was informed of the importance of frequent follow-up visits to maximize her success with intensive lifestyle modifications for her multiple health conditions.  Objective:   VITALS: Per patient if applicable, see vitals. GENERAL: Alert and in no acute distress. CARDIOPULMONARY: No increased WOB. Speaking in clear sentences.  PSYCH: Pleasant and cooperative. Speech normal rate and rhythm. Affect is appropriate. Insight and judgement are appropriate. Attention is focused, linear, and appropriate.  NEURO: Oriented as arrived to appointment on time with no prompting.   Lab Results  Component Value Date   CREATININE 1.56 (H) 03/29/2019   BUN 28 (H) 03/29/2019   NA 138 03/29/2019   K 4.1 03/29/2019   CL 105 03/29/2019   CO2 24 03/29/2019   Lab Results  Component Value Date   ALT 53 (H) 01/17/2019   AST 24 01/17/2019   ALKPHOS 305 (H) 01/17/2019   BILITOT 0.6 01/17/2019   Lab Results  Component Value Date   HGBA1C 7.9 (H) 09/01/2017   HGBA1C 5.3 04/01/2017   HGBA1C 5.8 (H) 09/05/2015   HGBA1C 6.8 (H) 11/21/2014   HGBA1C 6.3 (H) 11/07/2014   No results found for: INSULIN Lab Results  Component Value Date   TSH 2.408 08/30/2017   Lab Results  Component Value Date   CHOL 109 08/28/2015   HDL 43.80 08/28/2015   LDLCALC 45 08/28/2015   TRIG 89 04/18/2017  CHOLHDL 2 08/28/2015   Lab Results  Component Value Date   WBC 3.1 (L) 03/29/2019   HGB 11.0 (L) 03/29/2019   HCT 36.0 03/29/2019   MCV 97.6 03/29/2019   PLT 210 03/29/2019   Lab Results  Component Value Date   IRON 7 (L) 08/31/2017   TIBC 164 (L) 08/31/2017   FERRITIN 27 11/29/2014    Attestation Statements:   Reviewed by clinician on day of visit: allergies, medications,  problem list, medical history, surgical history, family history, social history, and previous encounter notes.  Time spent on visit including pre-visit chart review and post-visit care was 23 minutes.   I, Trixie Dredge, am acting as transcriptionist for Dennard Nip, MD.  I have reviewed the above documentation for accuracy and completeness, and I agree with the above. - Dennard Nip, MD

## 2019-08-22 ENCOUNTER — Telehealth: Payer: Self-pay | Admitting: *Deleted

## 2019-08-22 DIAGNOSIS — L89319 Pressure ulcer of right buttock, unspecified stage: Secondary | ICD-10-CM | POA: Diagnosis not present

## 2019-08-22 DIAGNOSIS — Z933 Colostomy status: Secondary | ICD-10-CM | POA: Diagnosis not present

## 2019-08-22 DIAGNOSIS — Z936 Other artificial openings of urinary tract status: Secondary | ICD-10-CM | POA: Diagnosis not present

## 2019-08-22 DIAGNOSIS — Z4801 Encounter for change or removal of surgical wound dressing: Secondary | ICD-10-CM | POA: Diagnosis not present

## 2019-08-22 NOTE — Telephone Encounter (Signed)
Called patient to inquire about prior authorization for Zarxio from  [employer]. Reviewed notes from 2020. Patient had some insurance changes.   Patient expresses concern about continuing Zarxio. She reports an elevated heart rate and general unwell feeling 24 hours following her injection. She has decreased the administration to a shot every 6 days as originally prescribed.   With the insurance change she should be able to get granix or Neupogen with a 0 dollar co pay. Can she have this instead? This new prescription needs to still be sent to [facility].

## 2019-08-23 ENCOUNTER — Telehealth: Payer: Self-pay | Admitting: *Deleted

## 2019-08-23 ENCOUNTER — Other Ambulatory Visit: Payer: Self-pay | Admitting: Hematology and Oncology

## 2019-08-23 DIAGNOSIS — D709 Neutropenia, unspecified: Secondary | ICD-10-CM

## 2019-08-23 MED ORDER — TBO-FILGRASTIM 480 MCG/0.8ML ~~LOC~~ SOSY: PREFILLED_SYRINGE | SUBCUTANEOUS | 11 refills | Status: DC

## 2019-08-23 NOTE — Telephone Encounter (Signed)
I think we can change her to Granix. Please remind her to call us when insurance changes She can decreased to every 6 days [facility] but make sure it's approved

## 2019-08-23 NOTE — Telephone Encounter (Signed)
Prior Auth Key: [id number] for Zarxio 480 mg submitted reads "rejected by insurance.  Nivestym 480 Mcg PA not required; Neupogen 300 Mcg or Granix 480 Mcg more likely to be covered."  Noted Granix 480 Mcg ordered by provider today.

## 2019-08-23 NOTE — Telephone Encounter (Signed)
Telephone call to patient to advise Granix has been sent into her pharmacy. Patient greatly appreciates this change. Patient will call [facility] to cancel recent request for Zarxio and notify them of the medication change. Patient encouraged to call this writer back if she discovers a prior Josem Kaufmann is needed on the Granix or additional documentation.

## 2019-08-27 DIAGNOSIS — R143 Flatulence: Secondary | ICD-10-CM | POA: Diagnosis not present

## 2019-08-27 DIAGNOSIS — Z86010 Personal history of colonic polyps: Secondary | ICD-10-CM | POA: Diagnosis not present

## 2019-08-28 DIAGNOSIS — Z4801 Encounter for change or removal of surgical wound dressing: Secondary | ICD-10-CM | POA: Diagnosis not present

## 2019-08-28 DIAGNOSIS — L89319 Pressure ulcer of right buttock, unspecified stage: Secondary | ICD-10-CM | POA: Diagnosis not present

## 2019-08-28 DIAGNOSIS — Z936 Other artificial openings of urinary tract status: Secondary | ICD-10-CM | POA: Diagnosis not present

## 2019-08-28 DIAGNOSIS — Z933 Colostomy status: Secondary | ICD-10-CM | POA: Diagnosis not present

## 2019-08-30 ENCOUNTER — Telehealth: Payer: Self-pay

## 2019-08-30 NOTE — Telephone Encounter (Signed)
-----   Message from Heath Lark, MD sent at 08/30/2019  8:10 AM EST ----- Regarding: appt in March Can you ask if she can come in earlier? If she prefers afternoon appt, I can see her later on 3/5

## 2019-08-30 NOTE — Telephone Encounter (Signed)
Called and given below message. She is agreeable to a earlier appt. Scheduled lab at 1130 and 1200 see Dr. Alvy Bimler 3/2. She verbalized understanding.

## 2019-08-31 ENCOUNTER — Telehealth: Payer: Self-pay

## 2019-08-31 ENCOUNTER — Ambulatory Visit: Payer: BC Managed Care – PPO

## 2019-08-31 NOTE — Telephone Encounter (Signed)
She called and left a message to call her regarding Granix Rx. Called back. [facility] need prior [facility] for Granix. She took a injection today and has one more for in 6 days. Talked with nurse who does prior authorization. She will resubmit for prior authorization. Patient aware and office will follow up on Monday.

## 2019-09-03 ENCOUNTER — Telehealth: Payer: Self-pay | Admitting: *Deleted

## 2019-09-03 NOTE — Telephone Encounter (Signed)
Telephone call to insurance company to appeal denial of Granix. Appeal paperwork completed on the phone with [facility]. It will be submitted to the appeal board and an answer will be sent via fax within 72 hours.   Per insurance. No G-CSF medications are within their formulary to dispense. It would be covered if she where to come to the office for her injection. Included neutropenic risk for outpatient appointments and patient has been self administering her injections greater than 3 years.

## 2019-09-04 ENCOUNTER — Telehealth: Payer: Self-pay | Admitting: *Deleted

## 2019-09-04 DIAGNOSIS — N1339 Other hydronephrosis: Secondary | ICD-10-CM | POA: Diagnosis not present

## 2019-09-04 DIAGNOSIS — Z9889 Other specified postprocedural states: Secondary | ICD-10-CM | POA: Diagnosis not present

## 2019-09-04 DIAGNOSIS — N2889 Other specified disorders of kidney and ureter: Secondary | ICD-10-CM | POA: Diagnosis not present

## 2019-09-04 DIAGNOSIS — N321 Vesicointestinal fistula: Secondary | ICD-10-CM | POA: Diagnosis not present

## 2019-09-04 NOTE — Telephone Encounter (Signed)
Received phone call from the [employer]- additional information needed, provided history of treatment and additional supporting evidence such as dose and side effects patient experienced with Zarxio. Shirlean Mylar stated this should be approved, a formal approval will be sent via fax as soon as the paperwork is processed.   Telephone call to patient and patient's husband to provide update.

## 2019-09-05 ENCOUNTER — Other Ambulatory Visit: Payer: Self-pay

## 2019-09-05 ENCOUNTER — Encounter (INDEPENDENT_AMBULATORY_CARE_PROVIDER_SITE_OTHER): Payer: Self-pay | Admitting: Family Medicine

## 2019-09-05 ENCOUNTER — Telehealth (INDEPENDENT_AMBULATORY_CARE_PROVIDER_SITE_OTHER): Payer: BC Managed Care – PPO | Admitting: Family Medicine

## 2019-09-05 DIAGNOSIS — Z6835 Body mass index (BMI) 35.0-35.9, adult: Secondary | ICD-10-CM | POA: Diagnosis not present

## 2019-09-05 DIAGNOSIS — L989 Disorder of the skin and subcutaneous tissue, unspecified: Secondary | ICD-10-CM | POA: Diagnosis not present

## 2019-09-05 NOTE — Progress Notes (Signed)
TeleHealth Visit:  Due to the COVID-19 pandemic, this visit was completed with telemedicine (audio/video) technology to reduce patient and provider exposure as well as to preserve personal protective equipment.   [name] has verbally consented to this TeleHealth visit. The patient is located at home, the provider is located at the [facility] office. The participants in this visit include the listed provider and patient. The visit was conducted today via [email or web address].   Chief Complaint: OBESITY [name] is here to discuss her progress with her obesity treatment plan along with follow-up of her obesity related diagnoses. [name] is on keeping a food journal and adhering to recommended goals of 1200-1400 calories and 75 grams of protein daily and states she is following her eating plan approximately 75-80% of the time. [name] states she is doing 0 minutes 0 times per week.  Today's visit was #: 26 Starting weight: 208 lbs Starting date: 04/05/18  Interim History: [name] has done well maintaining her weight loss since her last visit. 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 She is walking around the house, but she is not able to exercise formally yet.  Subjective:   1. 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 We had maximized her nutrition before surgery, but I suspect her protein intake has fallen somewhat since.  Assessment/Plan:   1. Non-healing skin lesion [name] is to continue to eat high quality microwaveable proteins to encourage wound healing, and will follow up.  2. Class 2 severe obesity with serious comorbidity and body mass index (BMI) of 35.0 to 35.9 in adult, unspecified obesity type (HCC) [name] is currently in the action stage of change. As such, her goal is to continue with weight loss efforts. She has agreed to keeping a food journal and  adhering to recommended goals of 1200-1400 calories and 65-75+ grams of protein daily.   Behavioral modification strategies: increasing lean protein intake.  [name] has agreed to follow-up with our clinic in 3 weeks with myself. She was informed of the importance of frequent follow-up visits to maximize her success with intensive lifestyle modifications for her multiple health conditions.  Objective:   VITALS: Per patient if applicable, see vitals. GENERAL: Alert and in no acute distress. CARDIOPULMONARY: No increased WOB. Speaking in clear sentences.  PSYCH: Pleasant and cooperative. Speech normal rate and rhythm. Affect is appropriate. Insight and judgement are appropriate. Attention is focused, linear, and appropriate.  NEURO: Oriented as arrived to appointment on time with no prompting.   Lab Results  Component Value Date   CREATININE 1.56 (H) 03/29/2019   BUN 28 (H) 03/29/2019   NA 138 03/29/2019   K 4.1 03/29/2019   CL 105 03/29/2019   CO2 24 03/29/2019   Lab Results  Component Value Date   ALT 53 (H) 01/17/2019   AST 24 01/17/2019   ALKPHOS 305 (H) 01/17/2019   BILITOT 0.6 01/17/2019   Lab Results  Component Value Date   HGBA1C 7.9 (H) 09/01/2017   HGBA1C 5.3 04/01/2017   HGBA1C 5.8 (H) 09/05/2015   HGBA1C 6.8 (H) 11/21/2014   HGBA1C 6.3 (H) 11/07/2014   No results found for: INSULIN Lab Results  Component Value Date   TSH 2.408 08/30/2017   Lab Results  Component Value Date   CHOL 109 08/28/2015   HDL 43.80 08/28/2015   LDLCALC 45 08/28/2015   TRIG  89 04/18/2017   CHOLHDL 2 08/28/2015   Lab Results  Component Value Date   WBC 3.1 (L) 03/29/2019   HGB 11.0 (L) 03/29/2019   HCT 36.0 03/29/2019   MCV 97.6 03/29/2019   PLT 210 03/29/2019   Lab Results  Component Value Date   IRON 7 (L) 08/31/2017   TIBC 164 (L) 08/31/2017   FERRITIN 27 11/29/2014    Attestation Statements:   Reviewed by clinician on day of visit: allergies, medications, problem  list, medical history, surgical history, family history, social history, and previous encounter notes.  Time spent on visit including pre-visit chart review and post-visit care was 20 minutes.    I, Trixie Dredge, am acting as transcriptionist for Dennard Nip, MD.  I have reviewed the above documentation for accuracy and completeness, and I agree with the above. - Dennard Nip, MD

## 2019-09-07 ENCOUNTER — Telehealth: Payer: Self-pay | Admitting: *Deleted

## 2019-09-07 ENCOUNTER — Ambulatory Visit: Payer: BC Managed Care – PPO | Attending: Internal Medicine

## 2019-09-07 DIAGNOSIS — Z933 Colostomy status: Secondary | ICD-10-CM | POA: Diagnosis not present

## 2019-09-07 DIAGNOSIS — Z23 Encounter for immunization: Secondary | ICD-10-CM | POA: Insufficient documentation

## 2019-09-07 DIAGNOSIS — L89319 Pressure ulcer of right buttock, unspecified stage: Secondary | ICD-10-CM | POA: Diagnosis not present

## 2019-09-07 DIAGNOSIS — Z4801 Encounter for change or removal of surgical wound dressing: Secondary | ICD-10-CM | POA: Diagnosis not present

## 2019-09-07 DIAGNOSIS — Z936 Other artificial openings of urinary tract status: Secondary | ICD-10-CM | POA: Diagnosis not present

## 2019-09-07 NOTE — Telephone Encounter (Signed)
Telephone call to [employer] ([employer]). Prior Josem Kaufmann was approved for Granix 08/03/2019-08/02/2020. Quantity limit of 5 per month. Patient needs to enroll in cost savings program, they have contacted them directly to go over this for copay assistance with the manufacturer.

## 2019-09-07 NOTE — Progress Notes (Signed)
   Covid-19 Vaccination Clinic  Name:  [name]    MRN: [id number] DOB: 1956-03-24  09/07/2019  [name] was observed post Covid-19 immunization for 15 minutes without incidence. She was provided with Vaccine Information Sheet and instruction to access the V-Safe system.   [name] was instructed to call 911 with any severe reactions post vaccine: [name] Kitchen Difficulty breathing  . Swelling of your face and throat  . A fast heartbeat  . A bad rash all over your body  . Dizziness and weakness    Immunizations Administered    Name Date Dose VIS Date Route   Pfizer COVID-19 Vaccine 09/07/2019  9:07 AM 0.3 mL 07/13/2019 Intramuscular   Manufacturer: [place]   Lot: [id number]   [place]: [id number]

## 2019-09-09 IMAGING — XA IR EXCHANGE NEPHROSTOMY RIGHT
1 series · 6 of 6 positions shown · non-contrast
Comparison: None.

INDICATION: Bilateral ureteral obstruction

EXAM:
BILATERAL NEPHROSTOMY EXCHANGE

[Series 300: tube placements · 6 of 6 slices shown]
[im 1/6]
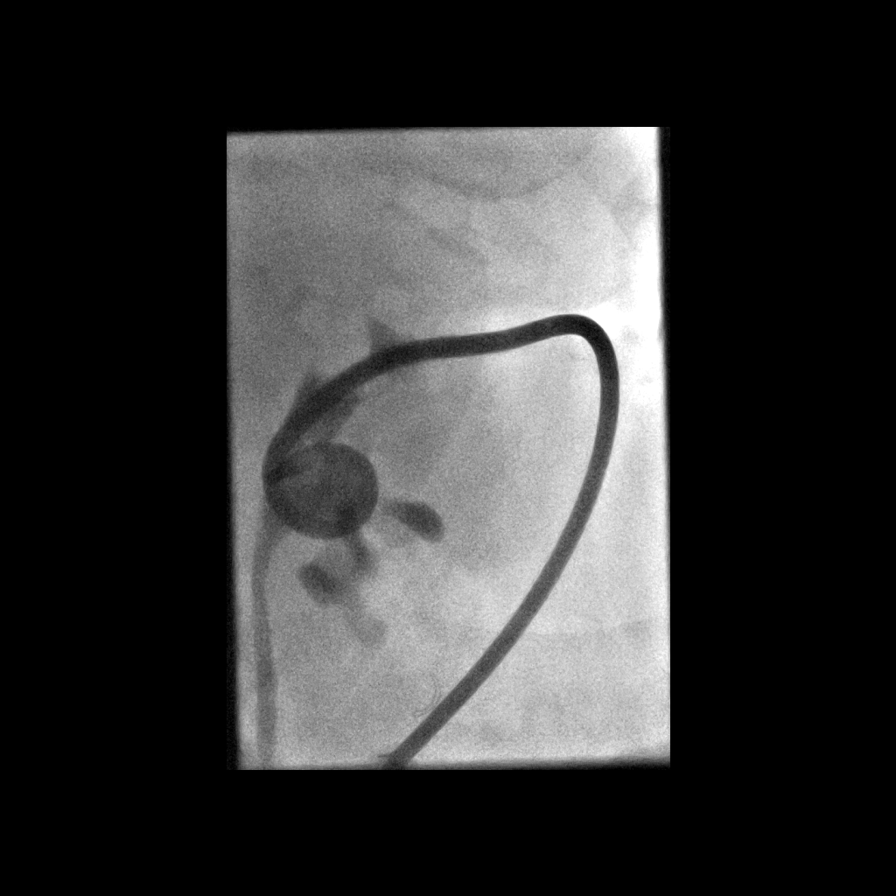
[im 2/6]
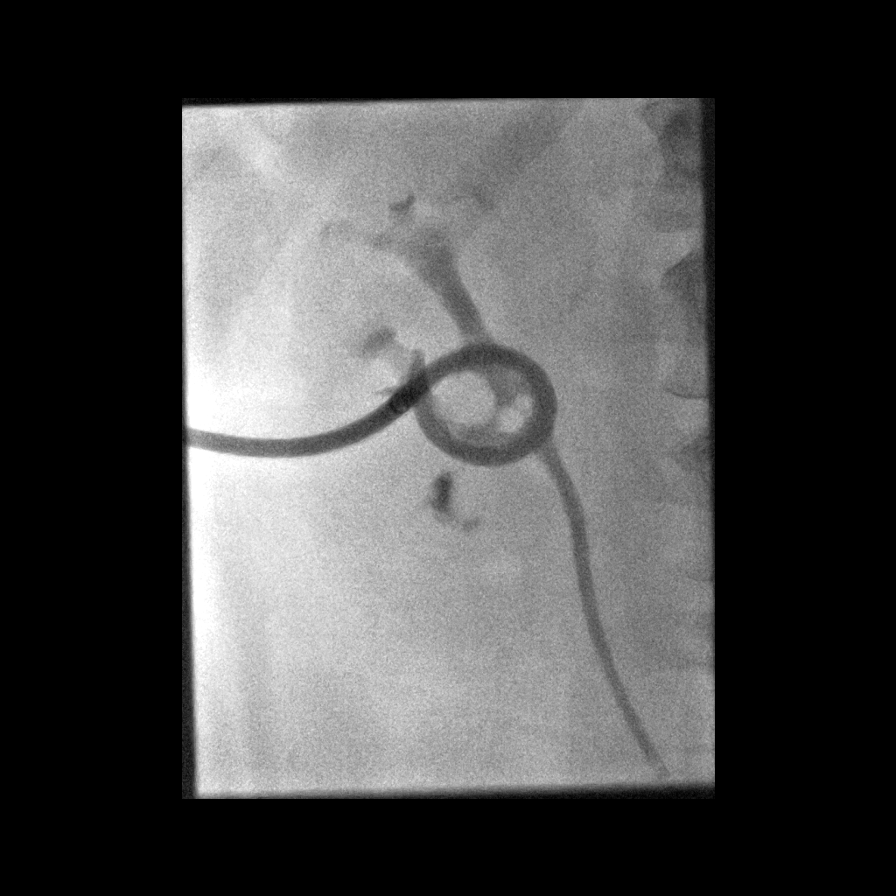
[im 3/6]
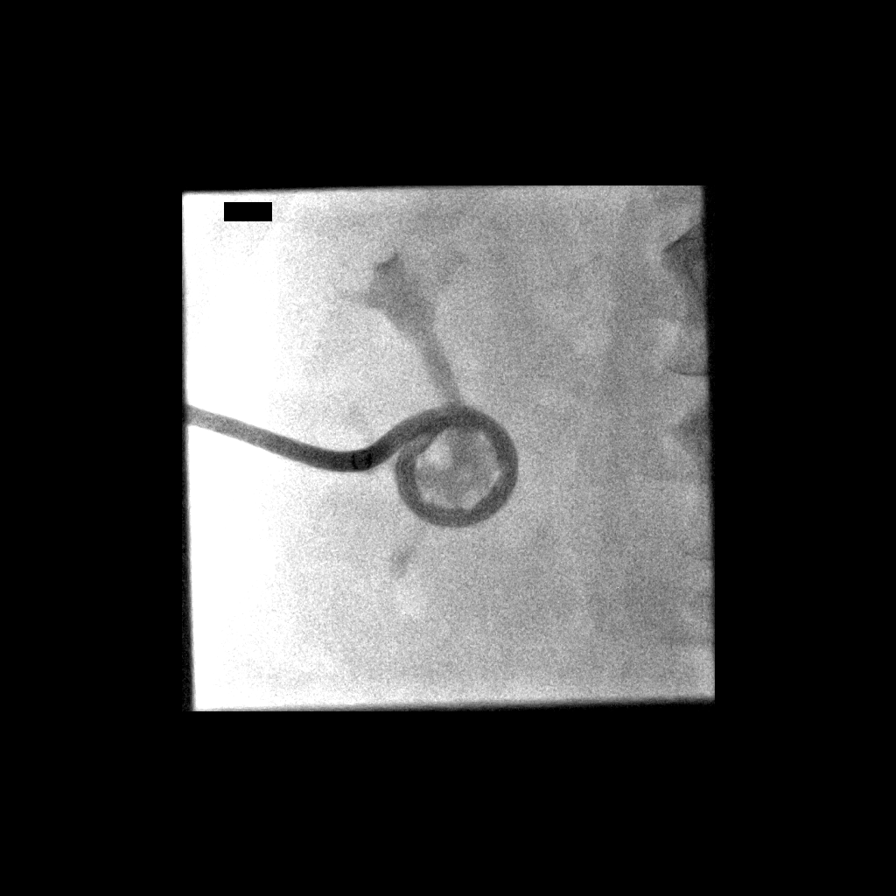
[im 4/6]
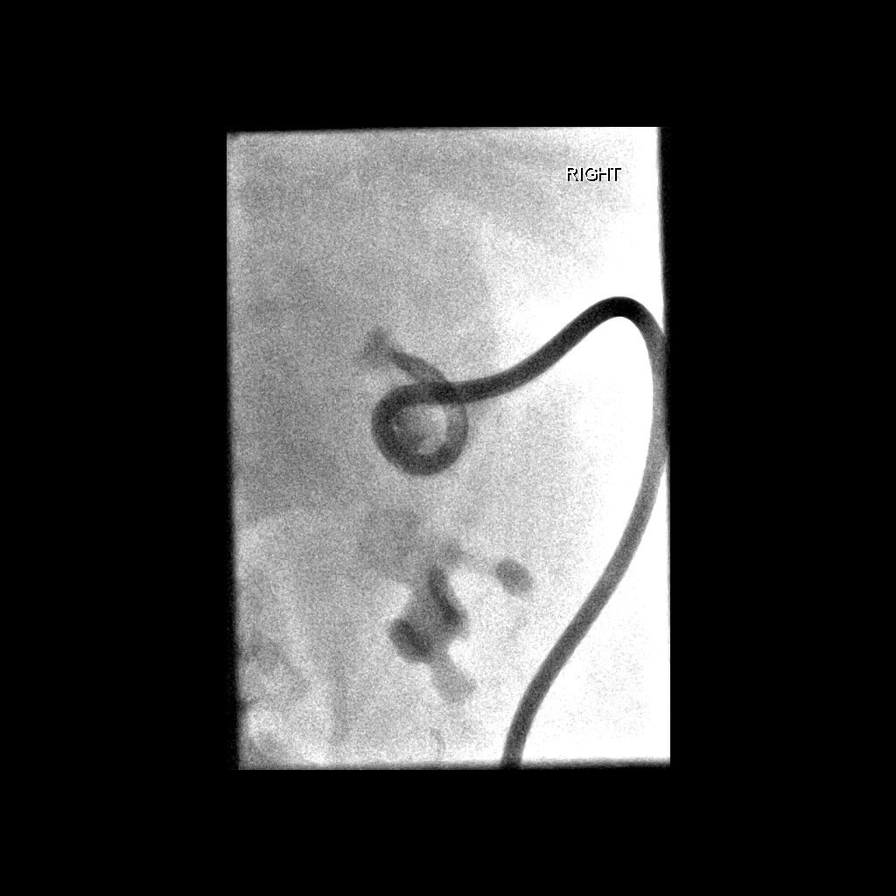
[im 5/6]
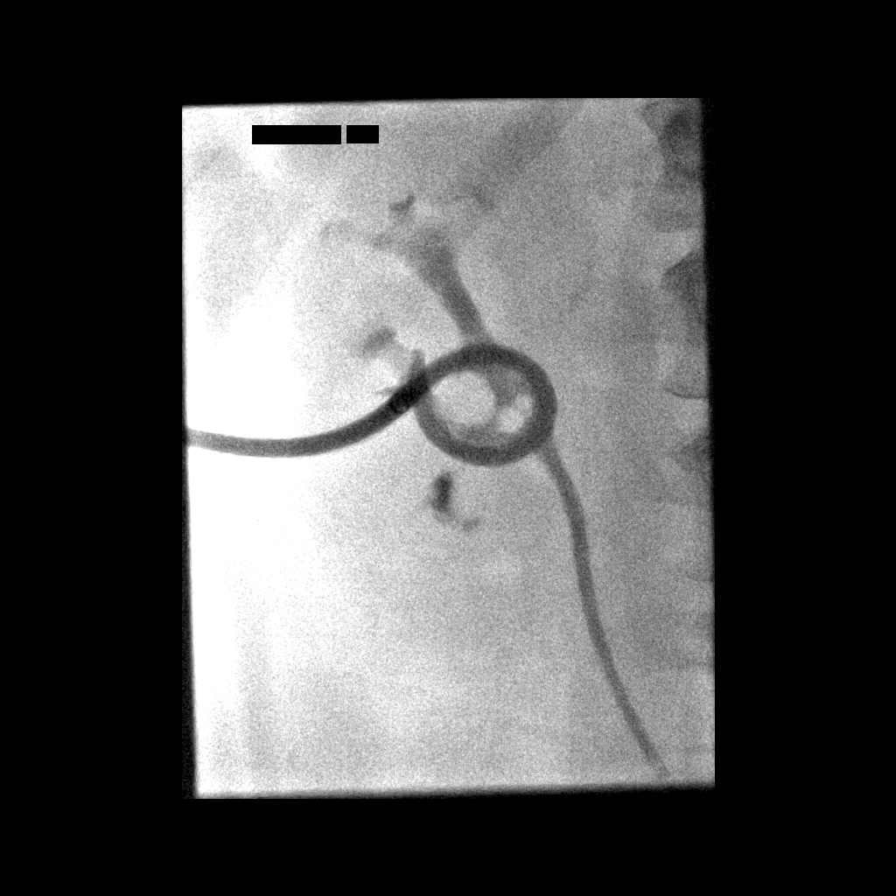
[im 6/6]
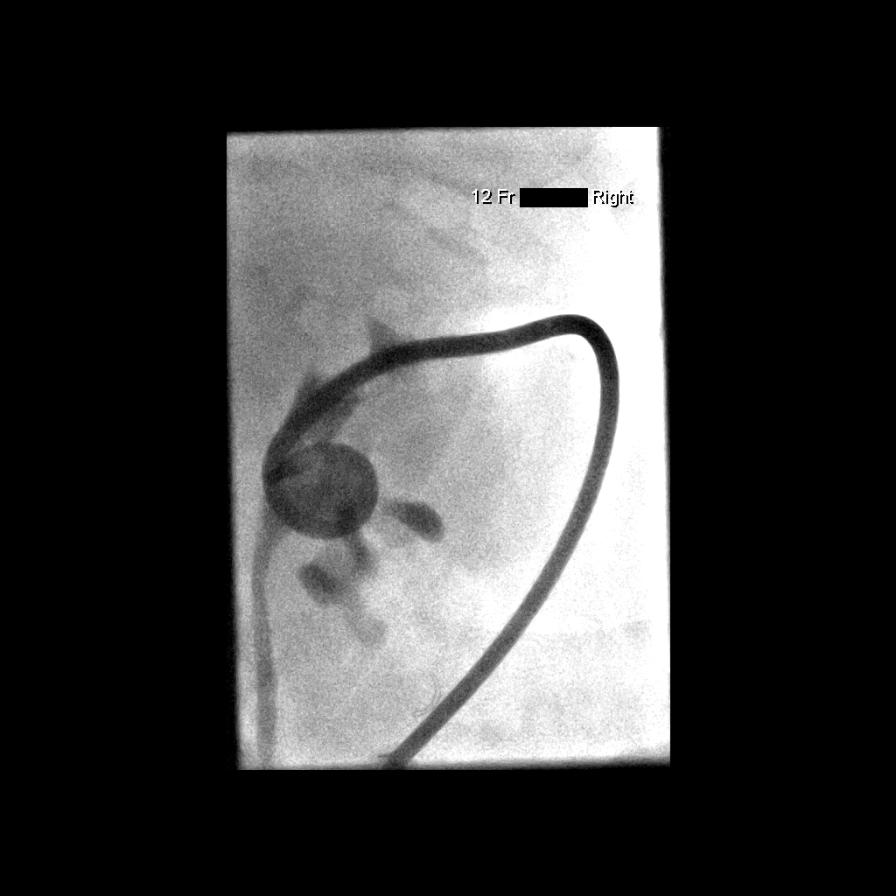

[6 of 6 positions shown; findings below may reference images not displayed]

MEDICATIONS:
None

ANESTHESIA/SEDATION:
None

CONTRAST:  10 cc Ssovue-4PP-administered into the collecting
system(s)

FLUOROSCOPY TIME:  Fluoroscopy Time: 1 minutes 18 seconds (22 mGy).

COMPLICATIONS:
None immediate.

PROCEDURE:
Informed written consent was obtained from the patient after a
thorough discussion of the procedural risks, benefits and
alternatives. All questions were addressed. Maximal Sterile Barrier
Technique was utilized including caps, mask, sterile gowns, sterile
gloves, sterile drape, hand hygiene and skin antiseptic. A timeout
was performed prior to the initiation of the procedure.

The back was prepped and draped in a sterile fashion. The left
nephrostomy was cut and exchanged over a Bentson wire for a 12
French nephrostomy catheter. It was looped and string fixed in the
left renal pelvis. Contrast was injected.

The right nephrostomy catheter was then cut and exchanged over a
Bentson wire for a 12 Disego Alwande catheter. It was looped
and string fixed in the right renal pelvis. Contrast was injected.
FINDINGS: Images demonstrate exchange of the bilateral 12 French nephrostomy
catheters. There positioned in the renal pelvises.
IMPRESSION: Successful bilateral 12 French nephrostomy catheter exchange.

## 2019-09-11 ENCOUNTER — Telehealth: Payer: Self-pay

## 2019-09-11 NOTE — Telephone Encounter (Signed)
She called and left a message that she is unable to get Granix Rx.  [facility]ed [facility] [employer] at [phone] regarding Granix. Reference # [id number]. Patient needs to enroll in the cost savings program before Rx will be sent.  Called Nan and given phone # [phone] to enroll in program. She verbalized understanding and will call and enroll.

## 2019-09-12 DIAGNOSIS — Z933 Colostomy status: Secondary | ICD-10-CM | POA: Diagnosis not present

## 2019-09-12 DIAGNOSIS — N1832 Chronic kidney disease, stage 3b: Secondary | ICD-10-CM | POA: Diagnosis not present

## 2019-09-12 DIAGNOSIS — E039 Hypothyroidism, unspecified: Secondary | ICD-10-CM | POA: Diagnosis not present

## 2019-09-12 DIAGNOSIS — Z936 Other artificial openings of urinary tract status: Secondary | ICD-10-CM | POA: Diagnosis not present

## 2019-09-12 DIAGNOSIS — D709 Neutropenia, unspecified: Secondary | ICD-10-CM | POA: Diagnosis not present

## 2019-09-12 DIAGNOSIS — N1831 Chronic kidney disease, stage 3a: Secondary | ICD-10-CM | POA: Diagnosis not present

## 2019-09-12 DIAGNOSIS — E782 Mixed hyperlipidemia: Secondary | ICD-10-CM | POA: Diagnosis not present

## 2019-09-12 DIAGNOSIS — E119 Type 2 diabetes mellitus without complications: Secondary | ICD-10-CM | POA: Diagnosis not present

## 2019-09-17 DIAGNOSIS — I129 Hypertensive chronic kidney disease with stage 1 through stage 4 chronic kidney disease, or unspecified chronic kidney disease: Secondary | ICD-10-CM | POA: Diagnosis not present

## 2019-09-17 DIAGNOSIS — N1832 Chronic kidney disease, stage 3b: Secondary | ICD-10-CM | POA: Diagnosis not present

## 2019-09-17 DIAGNOSIS — E119 Type 2 diabetes mellitus without complications: Secondary | ICD-10-CM | POA: Diagnosis not present

## 2019-09-17 DIAGNOSIS — E782 Mixed hyperlipidemia: Secondary | ICD-10-CM | POA: Diagnosis not present

## 2019-09-20 ENCOUNTER — Other Ambulatory Visit: Payer: Self-pay | Admitting: Nephrology

## 2019-09-20 DIAGNOSIS — R220 Localized swelling, mass and lump, head: Secondary | ICD-10-CM

## 2019-09-20 DIAGNOSIS — N1832 Chronic kidney disease, stage 3b: Secondary | ICD-10-CM

## 2019-09-21 ENCOUNTER — Ambulatory Visit: Payer: BC Managed Care – PPO

## 2019-09-25 IMAGING — CT CT ABD-PELV W/O CM
2 of 4 series · 15 of 46 positions shown, 17 images · non-contrast
Comparison: CT the abdomen and pelvis 02/01/2018.

CLINICAL DATA: 63-year-old female with history of left-sided flank
pain and lower abdominal pain and pressure. History of recurrent
urinary tract infections. History of endometrial cancer.

EXAM:
CT ABDOMEN AND PELVIS WITHOUT CONTRAST
TECHNIQUE: Multidetector CT imaging of the abdomen and pelvis was performed
following the standard protocol without IV contrast.

[Series 3: a/p w/o 5mm · axial · non-contrast · 0.98mm/px · z∈[+794,+1180]mm · 12 of 85 slices shown, 14 images]
[im 4/85  soft-tissue]
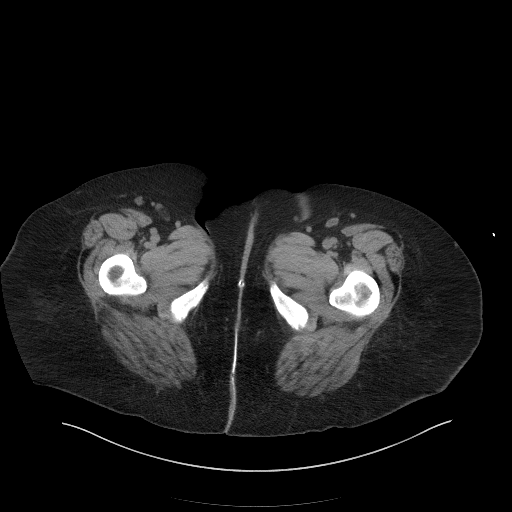
[im 4/85  bone]
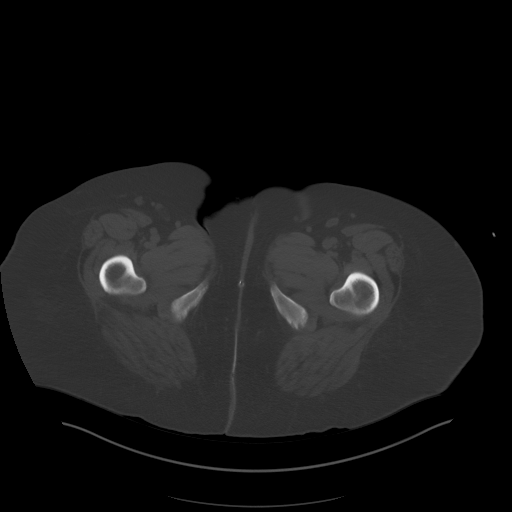
[im 11/85  soft-tissue]
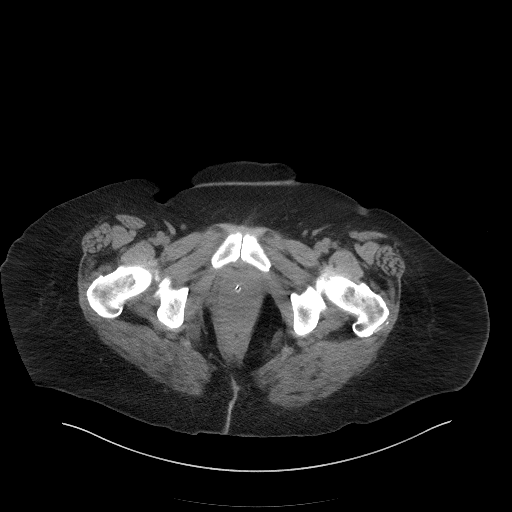
[im 18/85  soft-tissue]
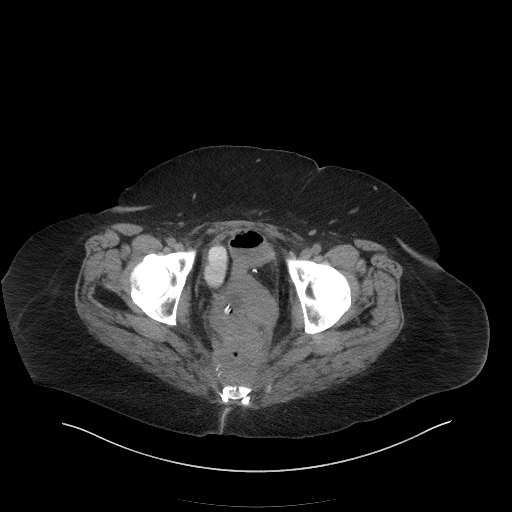
[im 25/85  soft-tissue]
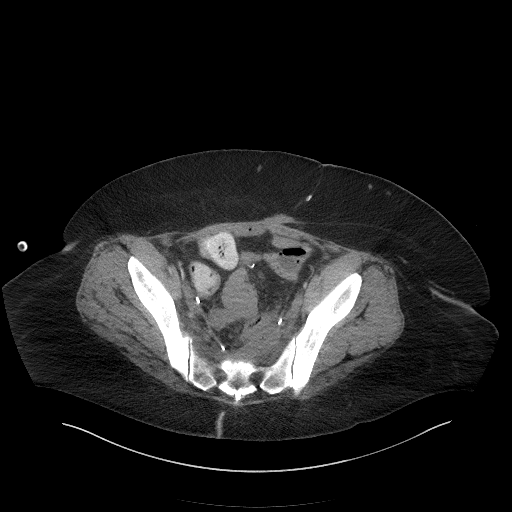
[im 32/85  soft-tissue]
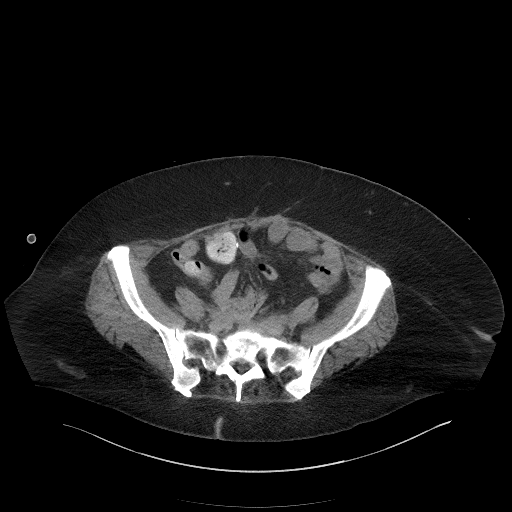
[im 39/85  soft-tissue]
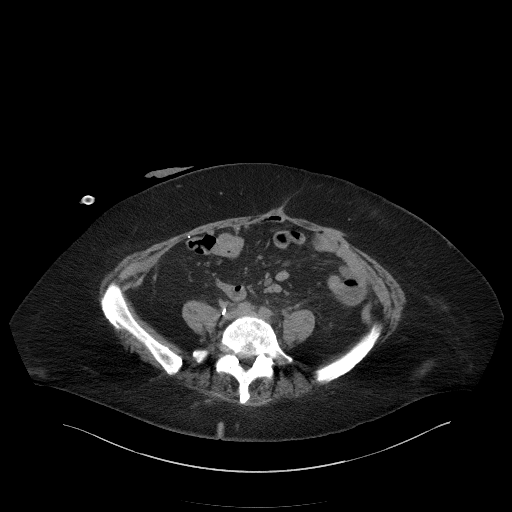
[im 46/85  soft-tissue]
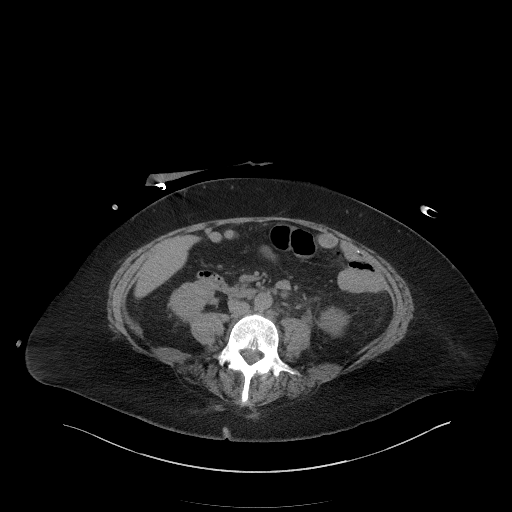
[im 53/85  soft-tissue]
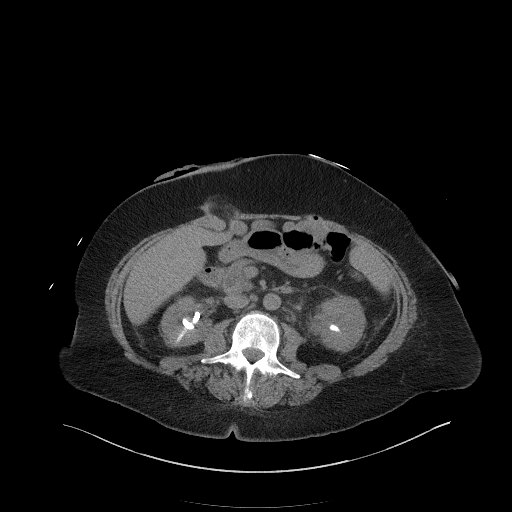
[im 60/85  soft-tissue]
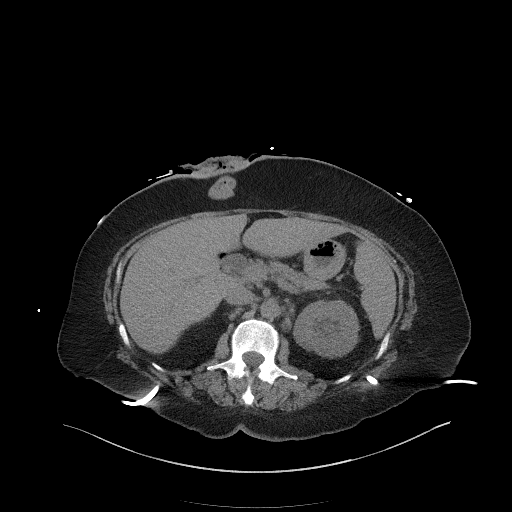
[im 60/85  bone]
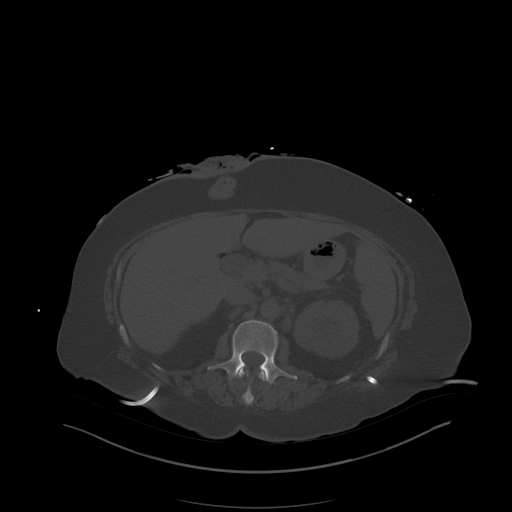
[im 67/85  soft-tissue]
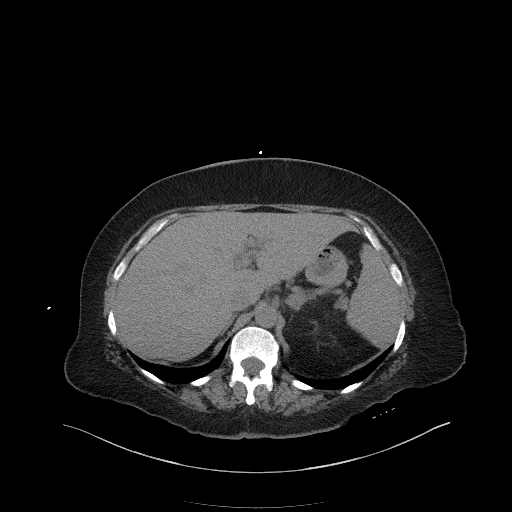
[im 74/85  soft-tissue]
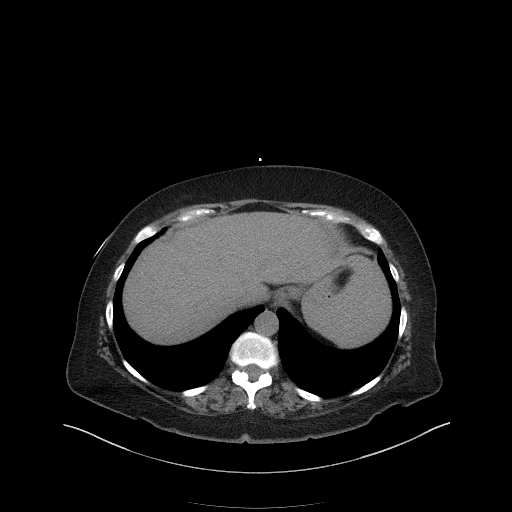
[im 81/85  soft-tissue]
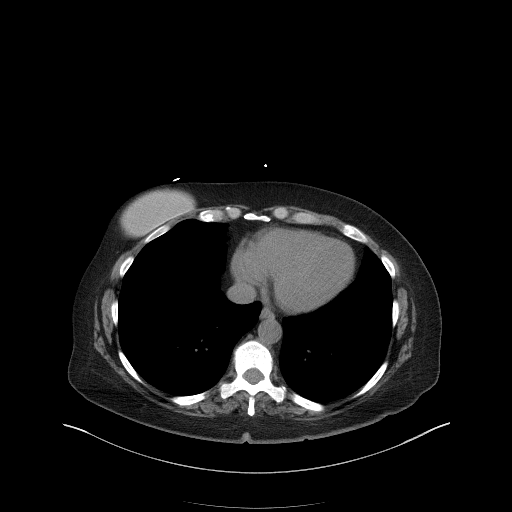

[Series 6: a/p w/o cor · coronal · non-contrast · 0.81mm/px · 3 of 151 slices shown]
[im 51/151  soft-tissue]
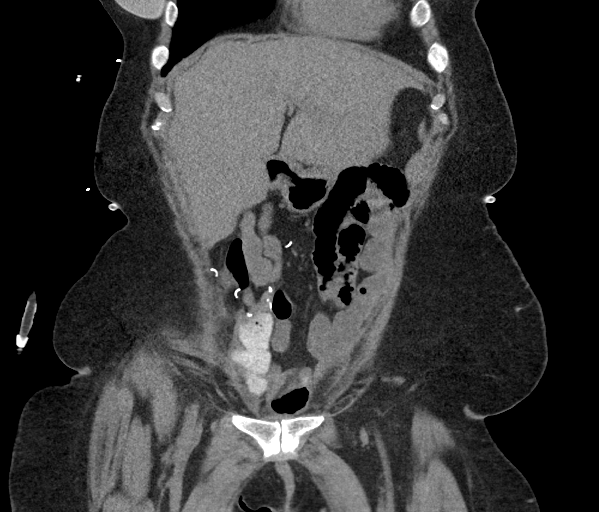
[im 67/151  soft-tissue]
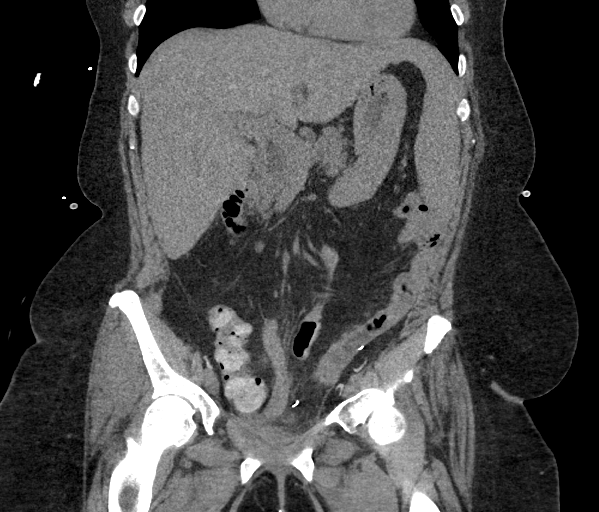
[im 84/151  soft-tissue]
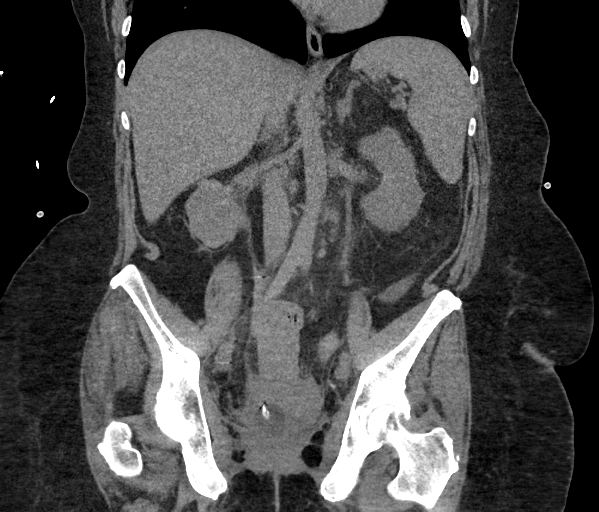

[15 of 46 positions shown; findings below may reference images not displayed]

FINDINGS: Lower chest: Bilateral breast implants incidentally noted.

Hepatobiliary: No definite cystic or solid hepatic lesions are
confidently identified on today's noncontrast CT examination. Status
post cholecystectomy. Common bile duct is dilated measuring 12 mm in
the porta hepatis, likely reflective of benign post cholecystectomy
physiology.

Pancreas: No definite pancreatic mass or peripancreatic fluid or
inflammatory changes are noted on today's noncontrast CT
examination.

Spleen: Unremarkable.

Adrenals/Urinary Tract: Percutaneous nephrostomy tubes are noted in
the collecting systems of both kidneys which appear decompressed. In
the lower pole of the left kidney there is a 4 mm nonobstructive
calculus. No definite renal lesions are confidently identified on
today's noncontrast CT examination. No hydroureteronephrosis.
Urinary bladder is decompressed around an indwelling Foley balloon
catheter. Right adrenal gland is normal. 1.9 x 1.4 cm intermediate
attenuation left adrenal nodule, not characterized, but stable
compared to prior studies dating back to 2103, presumably a lipid
poor adenoma or other benign lesion.

Stomach/Bowel: Unenhanced appearance of the stomach is normal. No
pathologic dilatation of small bowel or colon. Right upper quadrant
loop ileostomy and low anterior resection. Status post appendectomy.

Vascular/Lymphatic: No atherosclerotic calcifications noted in the
abdominal aorta or pelvic vasculature. No lymphadenopathy noted in
the abdomen or pelvis. Surgical clips are noted along the pelvic
sidewall bilaterally, presumably from prior and lymph node
dissection.

Reproductive: Status post hysterectomy. Ovaries are not confidently
identified may be surgically absent or atrophic

Other: No significant volume of ascites.  No pneumoperitoneum.

Musculoskeletal: There are no aggressive appearing lytic or blastic
lesions noted in the visualized portions of the skeleton.
IMPRESSION: 1. No definite acute finding noted in the abdomen or pelvis to
account for the patient's symptoms.
2. 4 mm nonobstructive calculus in the lower pole collecting system
of left kidney. No ureteral stones or findings of urinary tract
obstruction are noted at this time.
3. Chronic postoperative and procedural changes in the abdomen and
pelvis, similar to the prior study, as above.
4. Additional incidental findings, as above

## 2019-09-25 IMAGING — DX DG CHEST 2V
2 series · 2 of 2 positions shown · non-contrast
Comparison: Chest radiographs 11/29/2017 and earlier.

CLINICAL DATA: 63-year-old female with left flank pain come
abdominal pressure and pain, fever.

EXAM:
CHEST - 2 VIEW

[w chest pa]
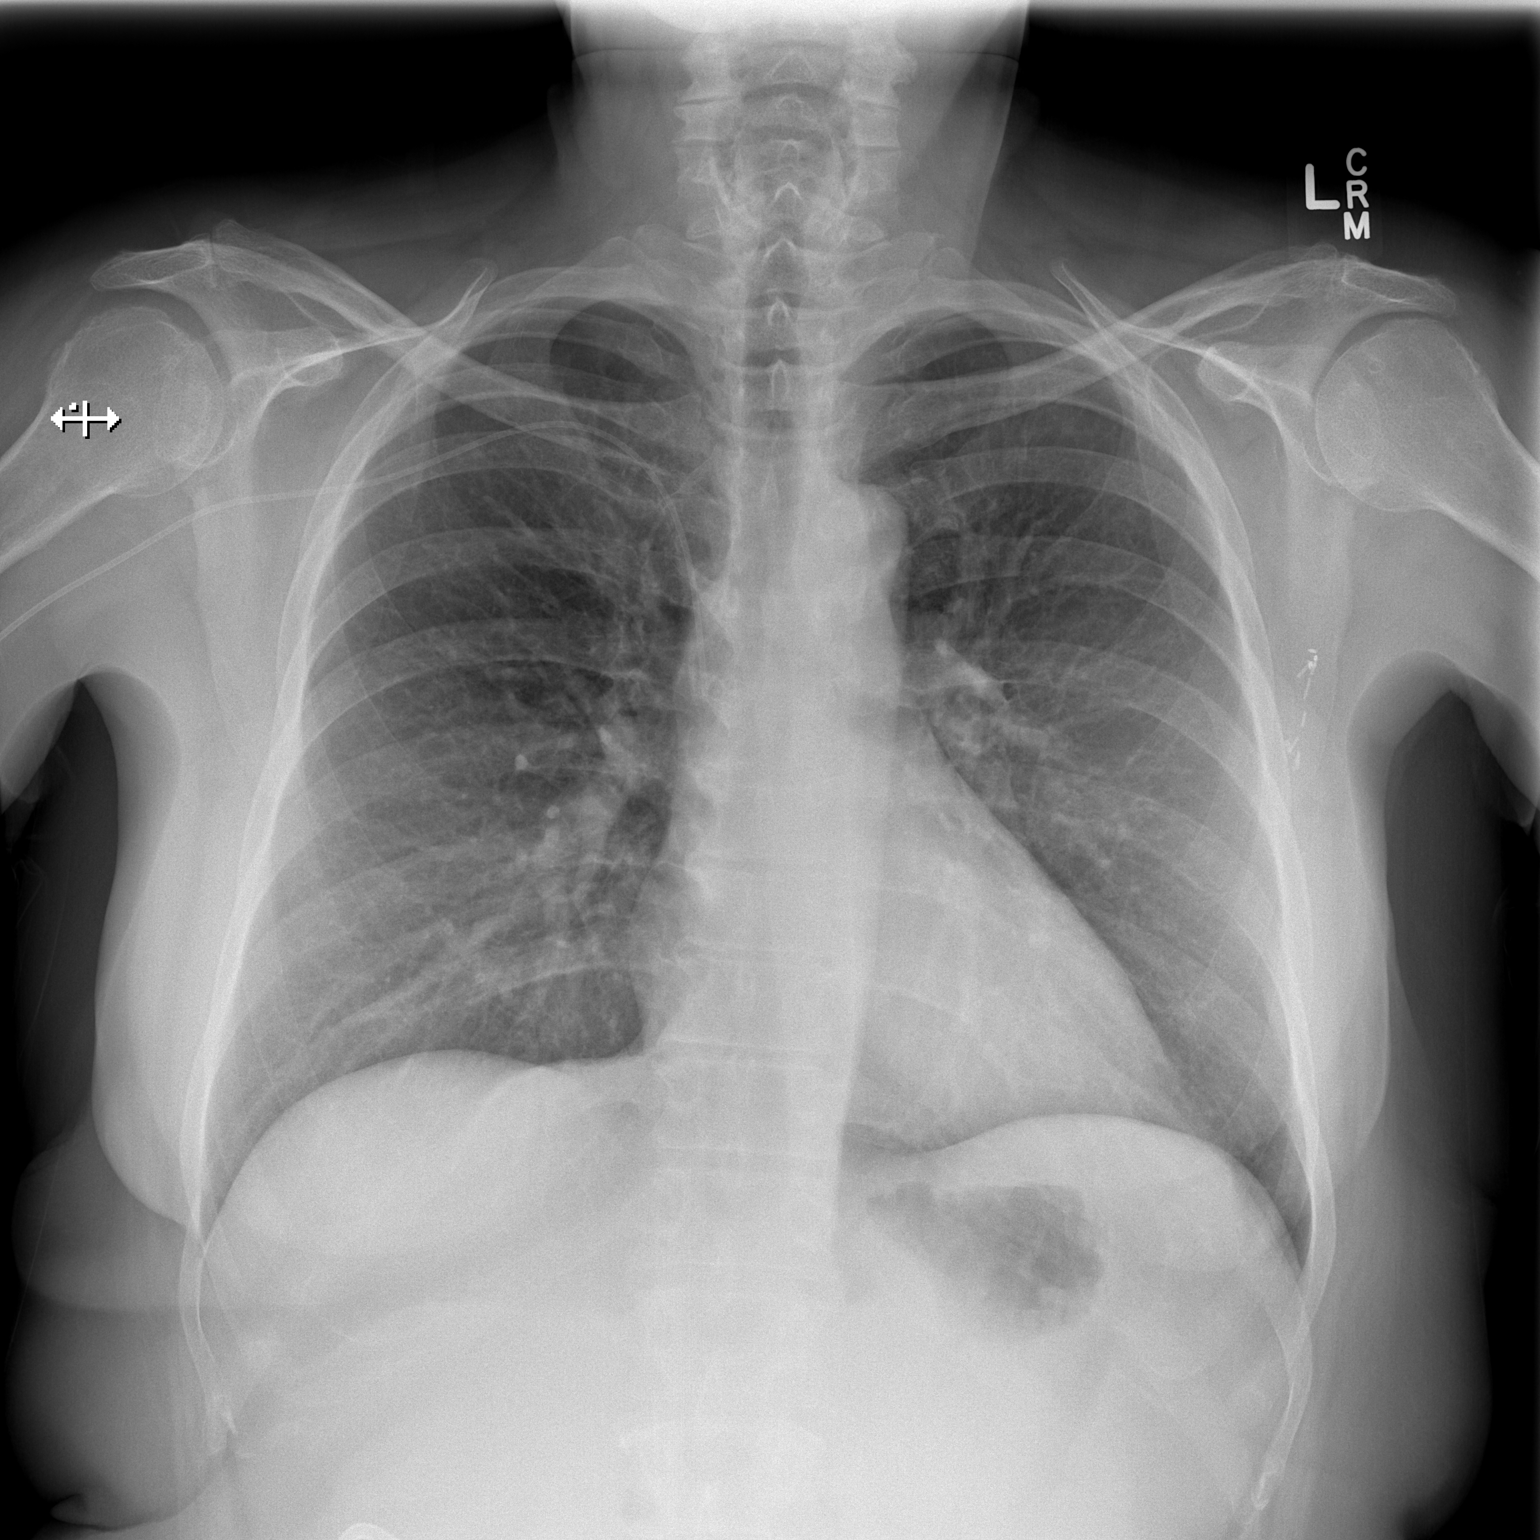

[w chest lat]
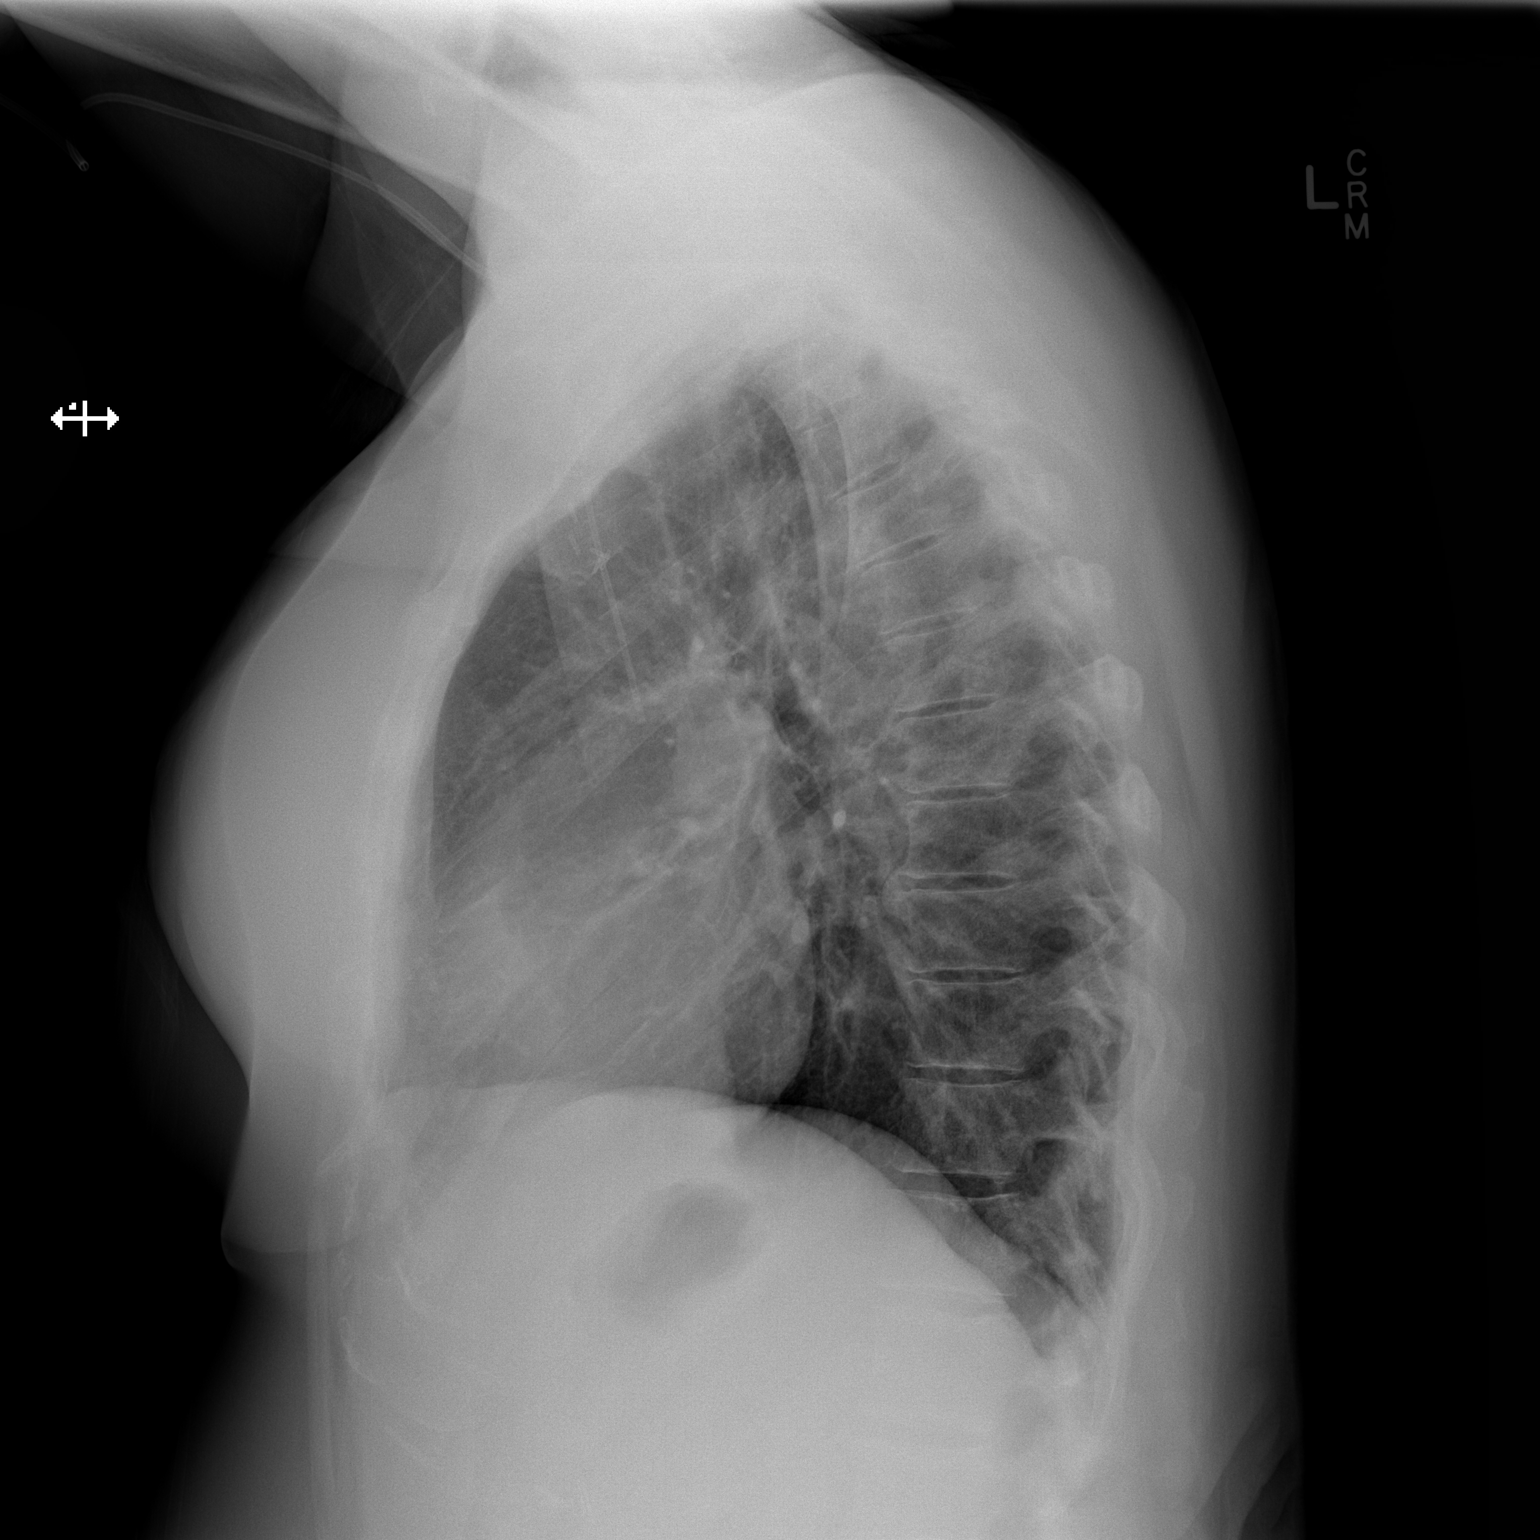

[2 of 2 positions shown; findings below may reference images not displayed]

FINDINGS: Right PICC line in place. Stable left axillary surgical clips. Lung
volumes and mediastinal contours are within normal limits.
Visualized tracheal air column is within normal limits. Both lungs
appear clear. No pneumothorax or pleural effusion. No
pneumoperitoneum. Paucity of bowel gas in the upper abdomen. No
acute osseous abnormality identified.
IMPRESSION: Negative.  No acute cardiopulmonary abnormality.

## 2019-09-26 ENCOUNTER — Telehealth (INDEPENDENT_AMBULATORY_CARE_PROVIDER_SITE_OTHER): Payer: BC Managed Care – PPO | Admitting: Family Medicine

## 2019-09-26 ENCOUNTER — Encounter (INDEPENDENT_AMBULATORY_CARE_PROVIDER_SITE_OTHER): Payer: Self-pay | Admitting: Family Medicine

## 2019-09-26 ENCOUNTER — Other Ambulatory Visit: Payer: Self-pay

## 2019-09-26 DIAGNOSIS — F338 Other recurrent depressive disorders: Secondary | ICD-10-CM | POA: Diagnosis not present

## 2019-09-26 DIAGNOSIS — Z6835 Body mass index (BMI) 35.0-35.9, adult: Secondary | ICD-10-CM | POA: Diagnosis not present

## 2019-09-26 NOTE — Progress Notes (Signed)
TeleHealth Visit:  Due to the COVID-19 pandemic, this visit was completed with telemedicine (audio/video) technology to reduce patient and provider exposure as well as to preserve personal protective equipment.   Tallyn has verbally consented to this TeleHealth visit. The patient is located at home, the provider is located at the [facility] office. The participants in this visit include the listed provider and patient. The visit was conducted today via face time.   Chief Complaint: OBESITY Christee is here to discuss her progress with her obesity treatment plan along with follow-up of her obesity related diagnoses. Kely is on practicing portion control and making smarter food choices, such as increasing vegetables and decreasing simple carbohydrates and states she is following her eating plan approximately 80% of the time. Tianni states she is doing 0 minutes 0 times per week.  Today's visit was #: 27 Starting weight: 208 lbs Starting date: 04/05/18  Interim History: Risha has been doing well maintaining her weight loss, and she is trying to portion control and make smarter choices. She had labs done at [facility], and her albumin is doing well which she is very happy with. Her exercise has been limited due to the weather, but she is anxious to start doing more outdoor activity as tolerable. She states her weight today was 189 lbs, and her blood pressure was 118/82 a week ago.  Subjective:   1. Seasonal affective disorder ([place]) Elham notes decrease in mood and increased irritability this month, which is causing some seasonal affective disorder but seem especially prevalent with COVID19 isolation. She notes feeling better with increased sun and nicer days.  Assessment/Plan:   1. Seasonal affective disorder ([place]) Shigeko was educated about seasonal affective disorder and the importance of increased sun exposure a little at a time. This should start to improve over the next  month.  2. Class 2 severe obesity with serious comorbidity and body mass index (BMI) of 35.0 to 35.9 in adult, unspecified obesity type (HCC) Tiwanda is currently in the action stage of change. As such, her goal is to continue with weight loss efforts. She has agreed to practicing portion control and making smarter food choices, such as increasing vegetables and decreasing simple carbohydrates.   Exercise goals: Lakisha is to exercise as tolerated.  Behavioral modification strategies: increasing lean protein intake and increasing vegetables.  Jisella has agreed to follow-up with our clinic in 2 weeks. She was informed of the importance of frequent follow-up visits to maximize her success with intensive lifestyle modifications for her multiple health conditions.  Objective:   VITALS: Per patient if applicable, see vitals. GENERAL: Alert and in no acute distress. CARDIOPULMONARY: No increased WOB. Speaking in clear sentences.  PSYCH: Pleasant and cooperative. Speech normal rate and rhythm. Affect is appropriate. Insight and judgement are appropriate. Attention is focused, linear, and appropriate.  NEURO: Oriented as arrived to appointment on time with no prompting.   Lab Results  Component Value Date   CREATININE 1.56 (H) 03/29/2019   BUN 28 (H) 03/29/2019   NA 138 03/29/2019   K 4.1 03/29/2019   CL 105 03/29/2019   CO2 24 03/29/2019   Lab Results  Component Value Date   ALT 53 (H) 01/17/2019   AST 24 01/17/2019   ALKPHOS 305 (H) 01/17/2019   BILITOT 0.6 01/17/2019   Lab Results  Component Value Date   HGBA1C 7.9 (H) 09/01/2017   HGBA1C 5.3 04/01/2017   HGBA1C 5.8 (H) 09/05/2015   HGBA1C  6.8 (H) 11/21/2014   HGBA1C 6.3 (H) 11/07/2014   No results found for: INSULIN Lab Results  Component Value Date   TSH 2.408 08/30/2017   Lab Results  Component Value Date   CHOL 109 08/28/2015   HDL 43.80 08/28/2015   LDLCALC 45 08/28/2015   TRIG 89 04/18/2017   CHOLHDL 2 08/28/2015    Lab Results  Component Value Date   WBC 3.1 (L) 03/29/2019   HGB 11.0 (L) 03/29/2019   HCT 36.0 03/29/2019   MCV 97.6 03/29/2019   PLT 210 03/29/2019   Lab Results  Component Value Date   IRON 7 (L) 08/31/2017   TIBC 164 (L) 08/31/2017   FERRITIN 27 11/29/2014    Attestation Statements:   Reviewed by clinician on day of visit: allergies, medications, problem list, medical history, surgical history, family history, social history, and previous encounter notes.  Time spent on visit including pre-visit chart review and post-visit care was 30 minutes.    I, Trixie Dredge, am acting as transcriptionist for Dennard Nip, MD.  I have reviewed the above documentation for accuracy and completeness, and I agree with the above. - Dennard Nip, MD

## 2019-09-30 IMAGING — XA IR EXCHANGE NEPHROSTOMY LEFT
1 series · 3 of 3 positions shown · non-contrast
Comparison: None.

INDICATION: 63-year-old female with a history of endometrial cancer and
bilateral chronic indwelling percutaneous nephrostomy tubes. Her
most recent nephrostomy tube exchange was on 09/18/2018. However,
she has been experiencing intermittent issues with her left-sided
tube including flank pain and relatively poor drainage. She presents
today for tube injection and possible exchange.

EXAM:
Nephrostomy tube exchange

[Series 300: ir nephrostomy exchange left · 3 of 3 slices shown]
[im 1/3]
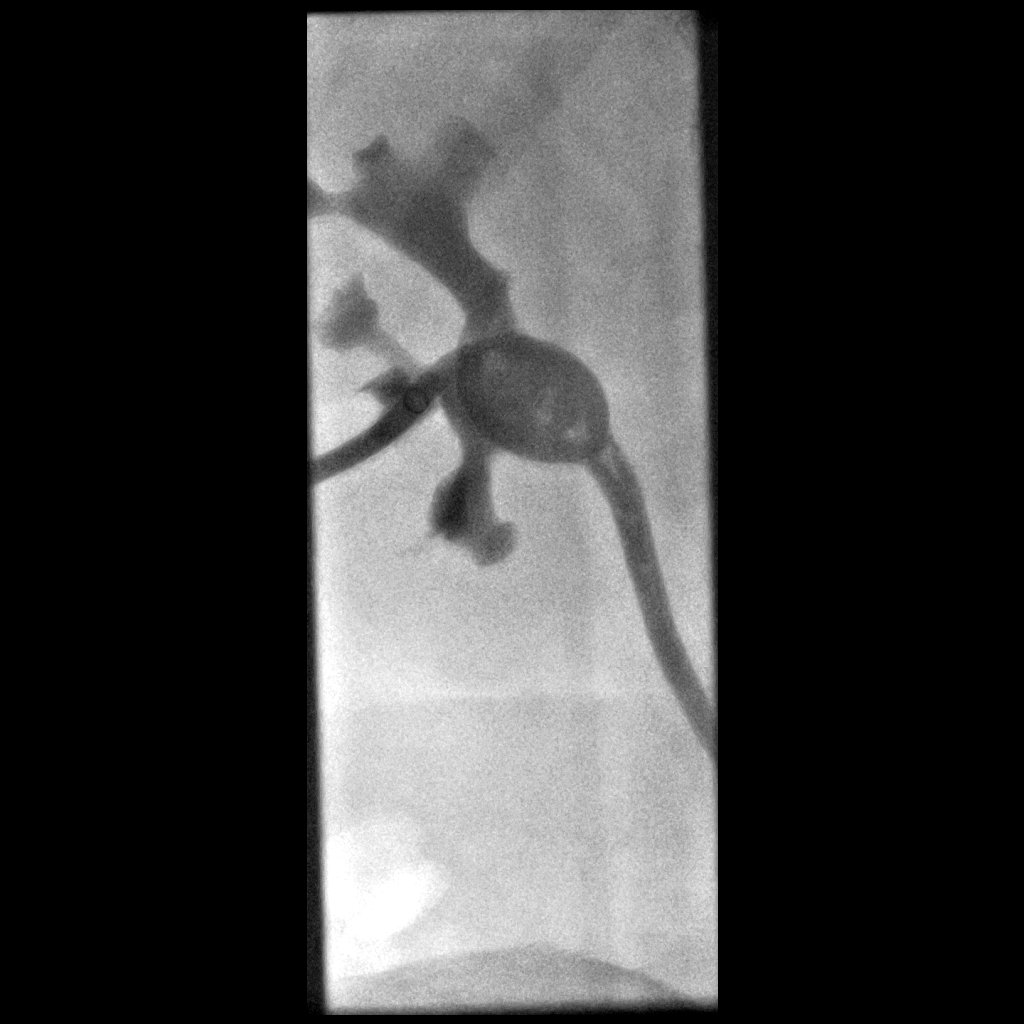
[im 2/3]
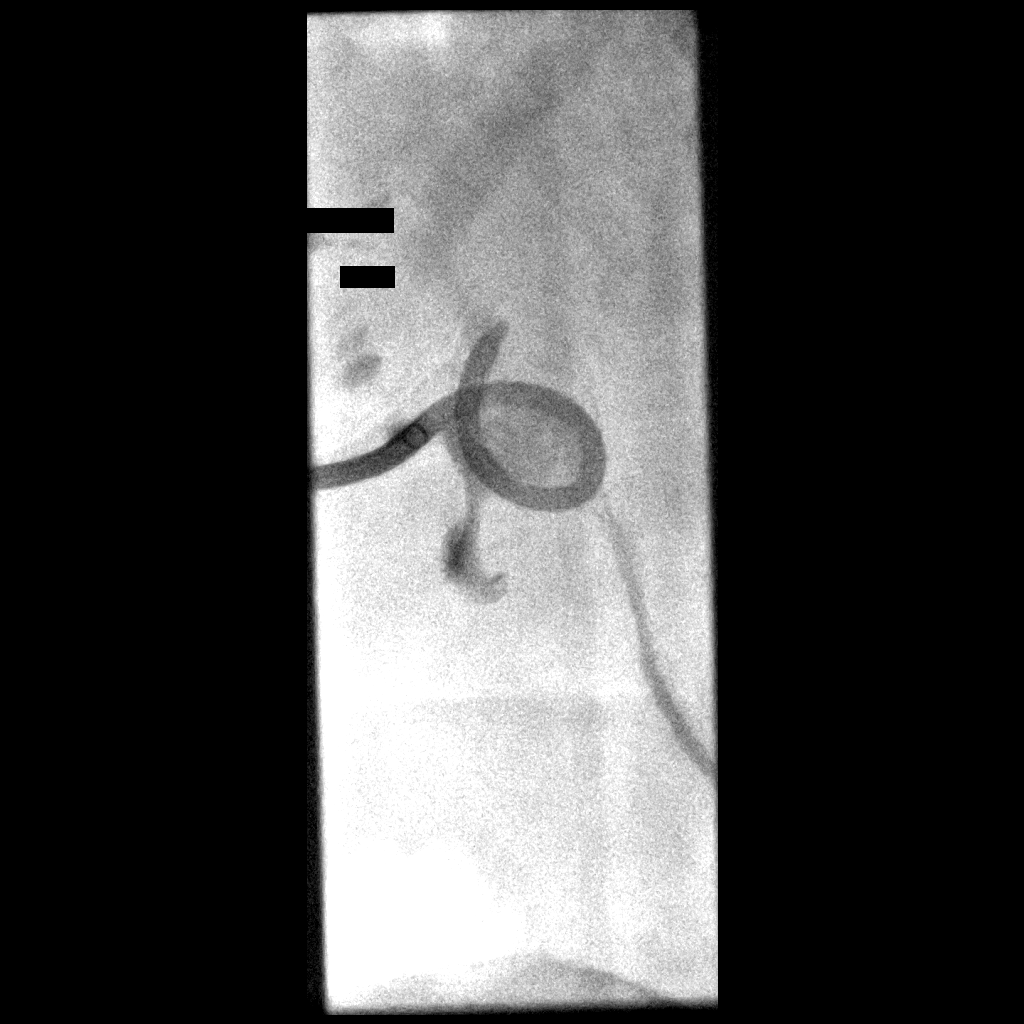
[im 3/3]
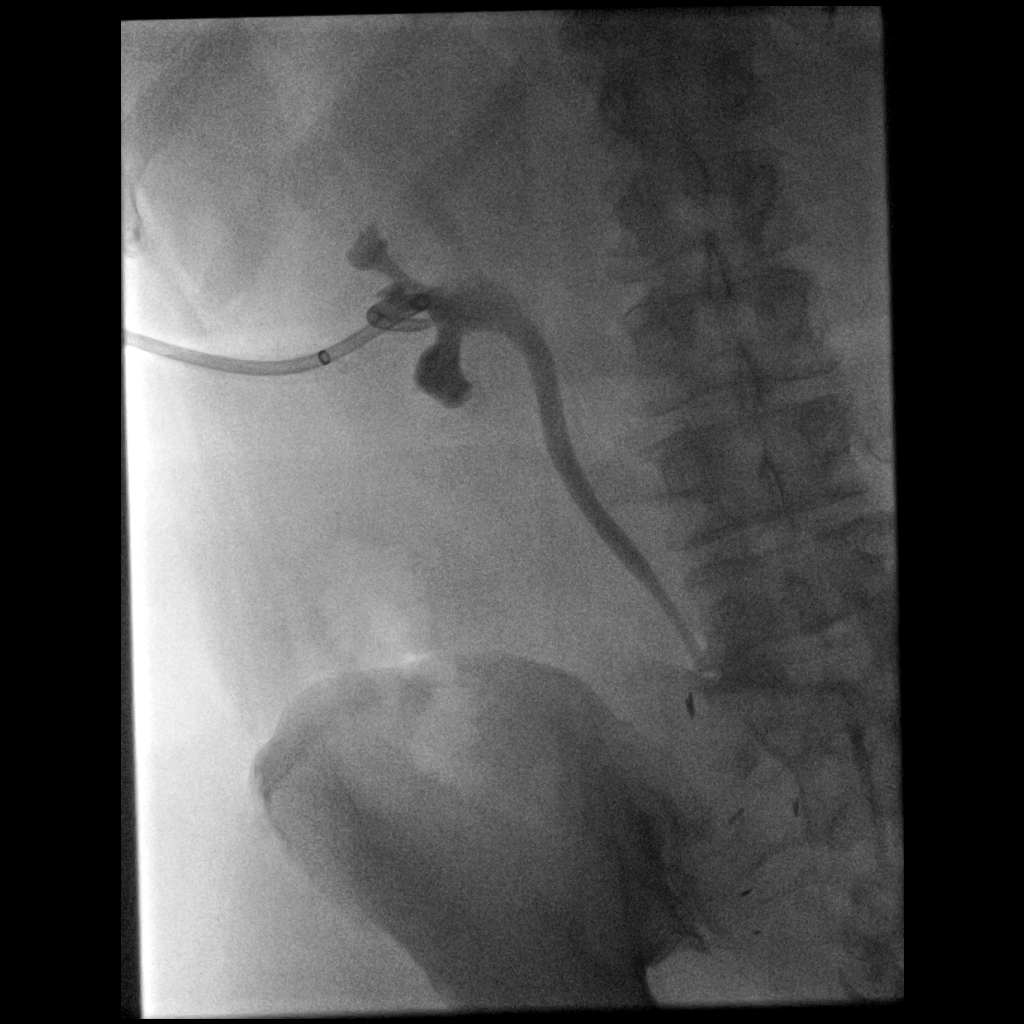

[3 of 3 positions shown; findings below may reference images not displayed]

MEDICATIONS:
None

ANESTHESIA/SEDATION:
None

CONTRAST:  10 mL Dsovue-FOO-administered into the collecting
system(s)

FLUOROSCOPY TIME:  Fluoroscopy Time: 3 minutes 54 seconds (120.6
mGy).

COMPLICATIONS:
None immediate.

PROCEDURE:
Informed written consent was obtained from the patient after a
thorough discussion of the procedural risks, benefits and
alternatives. All questions were addressed. Maximal Sterile Barrier
Technique was utilized including caps, mask, sterile gowns, sterile
gloves, sterile drape, hand hygiene and skin antiseptic. A timeout
was performed prior to the initiation of the procedure.

A gentle hand injection of contrast material was performed. The
existing percutaneous nephrostomy tube is pulled back into an
interpolar calyx. The decision was made to proceed with tube
exchange. The tube was cut and removed over a Bentson wire. This had
to be done very carefully so as not to avoid access. The wire was
reintroduced into the renal pelvis. Ashwin Bogan 10.2 French
all-purpose drainage catheter was then advanced over the wire and
formed in the renal pelvis. Of note, the renal pelvis is relatively
small in size which limits the ability for the catheter to formed.
The tip of the catheter lies within the upper pole infundibulum.
There are some filling defects within the renal pelvis which may
represent a small amount of blood clot.
IMPRESSION: 1. Pulled back existing percutaneous nephrostomy tube.
2. Successful repositioning and exchange for a new 10.2 French
all-purpose drainage catheter. Recommend consideration of smaller
locking loop diameter Sanuse Shoba catheter at her next visit.

## 2019-10-01 ENCOUNTER — Other Ambulatory Visit: Payer: Self-pay | Admitting: Hematology and Oncology

## 2019-10-01 DIAGNOSIS — C50212 Malignant neoplasm of upper-inner quadrant of left female breast: Secondary | ICD-10-CM

## 2019-10-01 DIAGNOSIS — C569 Malignant neoplasm of unspecified ovary: Secondary | ICD-10-CM

## 2019-10-01 DIAGNOSIS — C55 Malignant neoplasm of uterus, part unspecified: Secondary | ICD-10-CM

## 2019-10-02 ENCOUNTER — Inpatient Hospital Stay: Payer: BC Managed Care – PPO | Admitting: Hematology and Oncology

## 2019-10-02 ENCOUNTER — Ambulatory Visit: Payer: Self-pay | Admitting: Hematology and Oncology

## 2019-10-02 ENCOUNTER — Other Ambulatory Visit: Payer: Self-pay

## 2019-10-02 ENCOUNTER — Telehealth: Payer: Self-pay | Admitting: Hematology and Oncology

## 2019-10-02 ENCOUNTER — Inpatient Hospital Stay: Payer: BC Managed Care – PPO | Attending: Gynecologic Oncology

## 2019-10-02 DIAGNOSIS — D709 Neutropenia, unspecified: Secondary | ICD-10-CM

## 2019-10-02 DIAGNOSIS — Z936 Other artificial openings of urinary tract status: Secondary | ICD-10-CM | POA: Diagnosis not present

## 2019-10-02 DIAGNOSIS — L89319 Pressure ulcer of right buttock, unspecified stage: Secondary | ICD-10-CM | POA: Diagnosis not present

## 2019-10-02 DIAGNOSIS — C50212 Malignant neoplasm of upper-inner quadrant of left female breast: Secondary | ICD-10-CM | POA: Diagnosis not present

## 2019-10-02 DIAGNOSIS — Z90710 Acquired absence of both cervix and uterus: Secondary | ICD-10-CM | POA: Insufficient documentation

## 2019-10-02 DIAGNOSIS — D708 Other neutropenia: Secondary | ICD-10-CM | POA: Diagnosis not present

## 2019-10-02 DIAGNOSIS — Z8542 Personal history of malignant neoplasm of other parts of uterus: Secondary | ICD-10-CM | POA: Insufficient documentation

## 2019-10-02 DIAGNOSIS — N183 Chronic kidney disease, stage 3 unspecified: Secondary | ICD-10-CM | POA: Diagnosis not present

## 2019-10-02 DIAGNOSIS — Z923 Personal history of irradiation: Secondary | ICD-10-CM | POA: Diagnosis not present

## 2019-10-02 DIAGNOSIS — C569 Malignant neoplasm of unspecified ovary: Secondary | ICD-10-CM

## 2019-10-02 DIAGNOSIS — Z9221 Personal history of antineoplastic chemotherapy: Secondary | ICD-10-CM | POA: Diagnosis not present

## 2019-10-02 DIAGNOSIS — Z4801 Encounter for change or removal of surgical wound dressing: Secondary | ICD-10-CM | POA: Diagnosis not present

## 2019-10-02 DIAGNOSIS — Z79811 Long term (current) use of aromatase inhibitors: Secondary | ICD-10-CM | POA: Diagnosis not present

## 2019-10-02 DIAGNOSIS — E119 Type 2 diabetes mellitus without complications: Secondary | ICD-10-CM | POA: Diagnosis not present

## 2019-10-02 DIAGNOSIS — Z933 Colostomy status: Secondary | ICD-10-CM | POA: Diagnosis not present

## 2019-10-02 DIAGNOSIS — R590 Localized enlarged lymph nodes: Secondary | ICD-10-CM | POA: Insufficient documentation

## 2019-10-02 DIAGNOSIS — Z8543 Personal history of malignant neoplasm of ovary: Secondary | ICD-10-CM | POA: Diagnosis not present

## 2019-10-02 DIAGNOSIS — R600 Localized edema: Secondary | ICD-10-CM

## 2019-10-02 DIAGNOSIS — C55 Malignant neoplasm of uterus, part unspecified: Secondary | ICD-10-CM

## 2019-10-02 LAB — COMPREHENSIVE METABOLIC PANEL
ALT: 26 U/L (ref 0–44)
AST: 14 U/L — ABNORMAL LOW (ref 15–41)
Albumin: 3.3 g/dL — ABNORMAL LOW (ref 3.5–5.0)
Alkaline Phosphatase: 174 U/L — ABNORMAL HIGH (ref 38–126)
Anion gap: 9 (ref 5–15)
BUN: 38 mg/dL — ABNORMAL HIGH (ref 8–23)
CO2: 19 mmol/L — ABNORMAL LOW (ref 22–32)
Calcium: 9.3 mg/dL (ref 8.9–10.3)
Chloride: 111 mmol/L (ref 98–111)
Creatinine, Ser: 2.27 mg/dL — ABNORMAL HIGH (ref 0.44–1.00)
GFR calc Af Amer: 25 mL/min — ABNORMAL LOW (ref >60)
GFR calc non Af Amer: 22 mL/min — ABNORMAL LOW (ref >60)
Glucose, Bld: 144 mg/dL — ABNORMAL HIGH (ref 70–99)
Potassium: 3.4 mmol/L — ABNORMAL LOW (ref 3.5–5.1)
Sodium: 139 mmol/L (ref 135–145)
Total Bilirubin: 0.4 mg/dL (ref 0.3–1.2)
Total Protein: 8.1 g/dL (ref 6.5–8.1)

## 2019-10-02 LAB — CBC WITH DIFFERENTIAL/PLATELET
Abs Immature Granulocytes: 0.01 10*3/uL (ref 0.00–0.07)
Basophils Absolute: 0 10*3/uL (ref 0.0–0.1)
Basophils Relative: 1 %
Eosinophils Absolute: 0.2 10*3/uL (ref 0.0–0.5)
Eosinophils Relative: 5 %
HCT: 39 % (ref 36.0–46.0)
Hemoglobin: 12.4 g/dL (ref 12.0–15.0)
Immature Granulocytes: 0 %
Lymphocytes Relative: 25 %
Lymphs Abs: 0.8 10*3/uL (ref 0.7–4.0)
MCH: 31.1 pg (ref 26.0–34.0)
MCHC: 31.8 g/dL (ref 30.0–36.0)
MCV: 97.7 fL (ref 80.0–100.0)
Monocytes Absolute: 0.8 10*3/uL (ref 0.1–1.0)
Monocytes Relative: 24 %
Neutro Abs: 1.4 10*3/uL — ABNORMAL LOW (ref 1.7–7.7)
Neutrophils Relative %: 45 %
Platelets: 214 10*3/uL (ref 150–400)
RBC: 3.99 MIL/uL (ref 3.87–5.11)
RDW: 14.1 % (ref 11.5–15.5)
WBC: 3.3 10*3/uL — ABNORMAL LOW (ref 4.0–10.5)
nRBC: 0 % (ref 0.0–0.2)

## 2019-10-02 NOTE — Telephone Encounter (Signed)
Scheduled appt per 3/2 sch message - mailed reminder letter with appt date and time

## 2019-10-03 ENCOUNTER — Encounter: Payer: Self-pay | Admitting: Hematology and Oncology

## 2019-10-03 ENCOUNTER — Ambulatory Visit: Payer: BC Managed Care – PPO | Attending: Internal Medicine

## 2019-10-03 ENCOUNTER — Telehealth: Payer: Self-pay

## 2019-10-03 DIAGNOSIS — Z23 Encounter for immunization: Secondary | ICD-10-CM | POA: Insufficient documentation

## 2019-10-03 DIAGNOSIS — R600 Localized edema: Secondary | ICD-10-CM | POA: Insufficient documentation

## 2019-10-03 LAB — CA 125: Cancer Antigen (CA) 125: 5.8 U/mL (ref 0.0–38.1)

## 2019-10-03 NOTE — Assessment & Plan Note (Signed)
Her CBC today is stable although she is mildly neutropenic This has been chronic since her previous chemotherapy many years ago I recommend frequent injection every 5-6 days.  We will continue to obtain insurance authorization for G-CSF support indefinitely due to history of recurrent life-threatening infection

## 2019-10-03 NOTE — Telephone Encounter (Signed)
Spoke with pt by phone and gave below msg. No further needs at this time.

## 2019-10-03 NOTE — Assessment & Plan Note (Signed)
She is scheduled for imaging study at the end of the week It could be related to a obstructed salivary duct I would defer to her primary care doctor for management She might need ENT referral

## 2019-10-03 NOTE — Progress Notes (Signed)
[place] OFFICE PROGRESS NOTE  Patient Care Team: Carollee Herter, Alferd Apa, DO as PCP - General (Family Medicine) Heath Lark, MD as Consulting Physician (Hematology and Oncology) Everitt Amber, MD as Consulting Physician (Obstetrics and Gynecology) Michael [name], MD as Consulting Physician (General Surgery) Alexis Frock, MD as Consulting Physician (Urology) Altheimer, Legrand Como, MD as Consulting Physician (Endocrinology) Katy Apo, MD as Consulting Physician (Ophthalmology) Irene Limbo, MD as Consulting Physician (Plastic Surgery) Madelon Lips, MD as Consulting Physician (Nephrology)  ASSESSMENT & PLAN:  Breast cancer of upper-inner quadrant of left female breast [facility]) Her examination reveals some mild scarring and implant in situ but nothing to suggest cancer recurrence She is very concerned about her hair loss and wants to stop the summer to complete 4 years of treatment which I do not think is unreasonable I will see her once a year  History of ovarian & endometrial cancer CT imaging from last year was unremarkable Tumor marker is normal We will observe for now  Chronic neutropenia ([place]) Her CBC today is stable although she is mildly neutropenic This has been chronic since her previous chemotherapy many years ago I recommend frequent injection every 5-6 days.  We will continue to obtain insurance authorization for G-CSF support indefinitely due to history of recurrent life-threatening infection  Swelling of right parotid gland She is scheduled for imaging study at the end of the week It could be related to a obstructed salivary duct I would defer to her primary care doctor for management She might need ENT referral  CKD (chronic kidney disease), stage III ([place]) She will continue risk factor modification We discussed the importance of dietary modification to get her diabetes under control and adequate fluid intake to prevent dehydration   No  orders of the defined types were placed in this encounter.   All questions were answered. The patient knows to call the clinic with any problems, questions or concerns. The total time spent in the appointment was 30 minutes encounter with patients including review of chart and various tests results, discussions about plan of care and coordination of care plan   Heath Lark, MD 10/03/2019 8:43 AM  INTERVAL HISTORY: Please see below for problem oriented charting. She returns for complex history of chronic neutropenia induced by chemotherapy, history of ovarian cancer, uterine cancer and breast cancer on antiestrogen therapy She continues to receive G-CSF injection every 5 to 6 days to prevent infection She has no recent infection over the last 6 months She developed some parotid swelling on the right side recently that is very painful.  This is a second episode. She is scheduled for imaging study in the end of the week She is still managing her stoma, complicated by recurrent intermittent bleeding I have reviewed some of the documentation in the electronic records pertaining to her follow-up at [facility] She denies significant abdominal pain She denies any recent abnormal breast examination, palpable mass, abnormal breast appearance or skin changes over her surgical sites The patient is distressed with hair loss secondary to antiestrogen therapy and she is contemplating about stopping antiestrogen therapy this summer  SUMMARY OF ONCOLOGIC HISTORY: Oncology History Overview Note  Breast cancer of upper-inner quadrant of left female breast [facility])   Staging form: Breast, AJCC 7th Edition     Clinical stage from 12/31/2015: Stage IIA (T2, N0, M0) - Signed by Heath Lark, MD on 12/31/2015  ER,PR positive Her2 neg   History of ovarian & endometrial cancer  10/15/2004 Initial Diagnosis  History of ovarian cancer, treated with chemotherapy carbo/Taxol   03/17/2009 Bone Marrow Biopsy   Bone marrow biopsy  at [facility] showed neutropenia   01/23/2014 Bone Marrow Biopsy   Repeat bone marrow biopsy showed neutropenia   09/12/2014 Imaging   CT scan showed possible cancer   10/14/2014 Imaging   MRI show significant pelvic mass without invasion into the bladder but abutting to the rectum   11/19/2014 Surgery   she underwent surgery and had robotic-assisted lysis of adhesions, converted to laparotomy, radical upper vaginectomy and low anterior resection with colostomy. 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 Resection margins were negative   01/29/2015 - 03/10/2015 Radiation Therapy   She received adjuvant radiation   03/03/2015 Imaging   CT scan of the abdomen and pelvis showed status post interval removal of the bilateral nephroureteral stents. Worsening moderate right hydroureteronephrosis. Resolved left hydroureteronephrosis.   03/20/2015 Procedure   she had cystoscopy and stent placement for right hydronephrosis   05/02/2015 Surgery   Cystoscopy with right retrograde pyelogram interpretation. Diagnostic ureteroscopy. Removal of right ureteral stent.   05/14/2015 Imaging   CT scan of the abdomen and pelvis show unilateral right hydronephrosis with no residual cancer   08/18/2015 Imaging   CT scan showed hysterectomy with stable presacral soft tissue thickening. No definitive evidence of recurrent or metastatic disease. Severe right hydronephrosis   09/12/2015 Surgery   Cystoscopy with right retrograde pyelogram interpretation. Right ureteral stent placement 5 x 24 Polaris, no tether   10/07/2016 Imaging   Stable exam.  No new or progressive findings. 2. Stable appearance abnormal presacral soft tissue compatible with post treatment change. 3. Internal right ureteral stent with persistent right  hydroureteronephrosis. Differentially decreased perfusion of the right kidney suggests a component of underlying obstructive uropathy. 4. Stable 13 mm left adrenal nodule, previously characterized as adenoma.     01/27/2017 Imaging   Stable postop and post radiation changes in presacral region. No evidence of recurrent or metastatic carcinoma within the chest, abdomen, or pelvis. Stable mild to moderate right hydroureteronephrosis, with right ureteral stent in appropriate position. Stable small benign left adrenal adenoma and left thyroid lobe nodule.   10/02/2019 Tumor Marker   Patient's tumor was tested for the following markers: CA-125 Results of the tumor marker test revealed 5.8   Breast cancer of upper-inner quadrant of left female breast ([place])  10/14/2015 Imaging   Screening mammogram showed possible distortion in the left breast.   10/24/2015 Imaging   Targeted ultrasound is performed, showing a 0.6 x 0.8 x 0.9 cm area of hypoechoic distortion at the 10 o'clock position of the left breast 5 cm from the nipple   10/24/2015 Imaging   Diagnostic imaging confirmed 0.6 x 0.8 x 0.9 cm distortion in the upper inner left breast   10/30/2015 Procedure   Left US guided biopsy was performed   10/30/2015 Pathology Results   Accession: [id number] showed invasive ductal carcinoma, ER/PR positive, Her2 neg   11/05/2015 Genetic Testing   Genetic testing did not reveal a deleterious mutation.  Genetic testing did detect a Variant of Unknown Significance in the MSH6 gene called [id number]..  Genes tested include: APC, ATM, AXIN2, BARD1, BMPR1A, BRCA1, BRCA2, BRIP1, CDH1, CDKN2A, CHEK2, DICER1, EPCAM, GREM1, KIT, MEN1, MLH1, MSH2, MSH6, MUTYH, NBN, NF1, PALB2, PDGFRA, PMS2, POLD1, POLE, PTEN, RAD50, RAD51C, RAD51D, SDHA, SDHB,  SDHC, SDHD, SMAD4, SMARCA4. STK11, TP53, TSC1, TSC2, and VHL.   12/11/2015 Surgery   She undwerwent bilateral mastectomy, left SLN biopsy and immediate reconstruction surgeries with  bilateral plasma expanders   12/11/2015 Pathology Results   Accession: [id number] mastectomy specimens showed left breast ca, pT2N0M0   12/26/2015 Pathology Results   Accession: [id number] specimen from debridement showed no cancer   12/26/2015 Surgery   she underwent surgical debridement of necrotic tissue at site of plasma expander   01/12/2016 Imaging   Bone density is normal   02/02/2016 -  Anti-estrogen oral therapy   She started taking Arimidex   10/21/2017 Procedure   Ultrasound guided biopsy of a vague hypoechoic shadowing lesion, generally corresponding to area palpable abnormality, along chest lateral to the reconstructed left breast and inferior to the left axilla. No apparent complications.   01/17/2018 Imaging   Bone density scan is normal     REVIEW OF SYSTEMS:   Constitutional: Denies fevers, chills or abnormal weight loss Eyes: Denies blurriness of vision Ears, nose, mouth, throat, and face: Denies mucositis or sore throat Respiratory: Denies cough, dyspnea or wheezes Cardiovascular: Denies palpitation, chest discomfort or lower extremity swelling Gastrointestinal:  Denies nausea, heartburn or change in bowel habits Skin: Denies abnormal skin rashes Lymphatics: Denies new lymphadenopathy or easy bruising Neurological:Denies numbness, tingling or new weaknesses Behavioral/Psych: Mood is stable, no new changes  All other systems were reviewed with the patient and are negative.  I have reviewed the past medical history, past surgical history, social history and family history with the patient and they are unchanged from previous note.  ALLERGIES:  is allergic to penicillins; cefaclor; erythromycin; tape; trimethoprim; ultram [tramadol]; cephalosporins; fluconazole; oxycodone; pectin; septra [sulfamethoxazole-trimethoprim]; and sulfa antibiotics.  MEDICATIONS:  Current Outpatient Medications  Medication Sig Dispense Refill  . acetaminophen (TYLENOL) 325 MG tablet Take  2 tablets (650 mg total) by mouth every 6 (six) hours as needed. 30 tablet 1  . anastrozole (ARIMIDEX) 1 MG tablet Take 1 tablet (1 mg total) by mouth daily. 90 tablet 3  . Biotin 5 MG TABS Take 5 mg by mouth daily.     . Cholecalciferol (VITAMIN D3) 10000 UNITS capsule Take 10,000 Units by mouth every Sunday.     . clobetasol (OLUX) 0.05 % topical foam Apply topically 2 (two) times daily.    . diphenhydrAMINE (BENADRYL) 25 mg capsule Take 1 capsule (25 mg total) by mouth every 8 (eight) hours as needed for itching, allergies or sleep. 30 capsule 0  . ferrous sulfate 325 (65 FE) MG tablet Take 1 tablet (325 mg total) by mouth at bedtime. 30 tablet 3  . JANUVIA 50 MG tablet Take 50 mg by mouth daily.     [name] Kitchen levothyroxine (SYNTHROID, LEVOTHROID) 150 MCG tablet Take 1 tablet (150 mcg total) by mouth daily before breakfast. 30 tablet 1  . loratadine (CLARITIN) 10 MG tablet Take 10 mg by mouth daily.     [name] Kitchen omega-3 acid ethyl esters (LOVAZA) 1 G capsule Take 1 g by mouth 2 (two) times daily.     Vladimir Faster Glycol-Propyl Glycol (SYSTANE OP) Place 1 drop into both eyes daily as needed (dry eyes).     . pregabalin (LYRICA) 50 MG capsule Take 1 capsule (50 mg total) by mouth 2 (two) times daily. 60 capsule 2  . Prenatal Vit-Fe Fumarate-FA (PRENATAL VITAMIN PO) Take 1 capsule by mouth daily. Takes prenatal because there are no dyes in it    . Probiotic Product (ALIGN  PO) Take by mouth 2 (two) times daily.    . rosuvastatin (CRESTOR) 10 MG tablet Take 10 mg by mouth every evening.     . Saccharomyces boulardii (FLORASTOR PO) Take 1 capsule by mouth daily.    . Tbo-Filgrastim (GRANIX) 480 MCG/0.8ML SOSY injection 480 mcg subcutaneous injection every 6 days life long 11.2 mL 11   No current facility-administered medications for this visit.    PHYSICAL EXAMINATION: ECOG PERFORMANCE STATUS: 1 - Symptomatic but completely ambulatory  Vitals:   10/02/19 1219  BP: (!) 121/59  Pulse: 87  Resp: 18  Temp: 98  F (36.7 C)  SpO2: 100%   Filed Weights   10/02/19 1219  Weight: 196 lb 6.4 oz (89.1 kg)    GENERAL:alert, no distress and comfortable SKIN: skin color, texture, turgor are normal, no rashes or significant lesions EYES: normal, Conjunctiva are pink and non-injected, sclera clear OROPHARYNX:no exudate, no erythema and lips, buccal mucosa, and tongue normal  NECK: supple, thyroid normal size, non-tender, without nodularity LYMPH:  no palpable lymphadenopathy in the cervical, axillary or inguinal LUNGS: clear to auscultation and percussion with normal breathing effort HEART: regular rate & rhythm and no murmurs and no lower extremity edema ABDOMEN:abdomen soft, non-tender and normal bowel sounds.  Her stoma appears to be somewhat bleeding.  Well-healed surgical scars Musculoskeletal:no cyanosis of digits and no clubbing  NEURO: alert & oriented x 3 with fluent speech, no focal motor/sensory deficits Bilateral chest wall examination revealed well-healed mastectomy scar with implant in situ with no other abnormalities  LABORATORY DATA:  I have reviewed the data as listed    Component Value Date/Time   NA 139 10/02/2019 1158   NA 142 11/29/2018 0000   NA 141 01/24/2017 1228   K 3.4 (L) 10/02/2019 1158   K 4.6 01/24/2017 1228   CL 111 10/02/2019 1158   CO2 19 (L) 10/02/2019 1158   CO2 29 01/24/2017 1228   GLUCOSE 144 (H) 10/02/2019 1158   GLUCOSE 84 01/24/2017 1228   BUN 38 (H) 10/02/2019 1158   BUN 22 (A) 11/29/2018 0000   BUN 22.2 01/24/2017 1228   CREATININE 2.27 (H) 10/02/2019 1158   CREATININE 1.98 (H) 03/27/2018 1324   CREATININE 0.8 01/24/2017 1228   CALCIUM 9.3 10/02/2019 1158   CALCIUM 10.2 01/24/2017 1228   PROT 8.1 10/02/2019 1158   PROT 6.9 01/24/2017 1228   ALBUMIN 3.3 (L) 10/02/2019 1158   ALBUMIN 3.4 (L) 01/24/2017 1228   AST 14 (L) 10/02/2019 1158   AST 14 (L) 03/27/2018 1324   AST 16 01/24/2017 1228   ALT 26 10/02/2019 1158   ALT 18 03/27/2018 1324   ALT  14 01/24/2017 1228   ALKPHOS 174 (H) 10/02/2019 1158   ALKPHOS 90 01/24/2017 1228   BILITOT 0.4 10/02/2019 1158   BILITOT 0.2 (L) 03/27/2018 1324   BILITOT 0.34 01/24/2017 1228   GFRNONAA 22 (L) 10/02/2019 1158   GFRNONAA 26 (L) 03/27/2018 1324   GFRNONAA 78 12/16/2014 1530   GFRAA 25 (L) 10/02/2019 1158   GFRAA 30 (L) 03/27/2018 1324   GFRAA >89 12/16/2014 1530    No results found for: SPEP, UPEP  Lab Results  Component Value Date   WBC 3.3 (L) 10/02/2019   NEUTROABS 1.4 (L) 10/02/2019   HGB 12.4 10/02/2019   HCT 39.0 10/02/2019   MCV 97.7 10/02/2019   PLT 214 10/02/2019      Chemistry      Component Value Date/Time   NA 139  10/02/2019 1158   NA 142 11/29/2018 0000   NA 141 01/24/2017 1228   K 3.4 (L) 10/02/2019 1158   K 4.6 01/24/2017 1228   CL 111 10/02/2019 1158   CO2 19 (L) 10/02/2019 1158   CO2 29 01/24/2017 1228   BUN 38 (H) 10/02/2019 1158   BUN 22 (A) 11/29/2018 0000   BUN 22.2 01/24/2017 1228   CREATININE 2.27 (H) 10/02/2019 1158   CREATININE 1.98 (H) 03/27/2018 1324   CREATININE 0.8 01/24/2017 1228   GLU 131 11/29/2018 0000      Component Value Date/Time   CALCIUM 9.3 10/02/2019 1158   CALCIUM 10.2 01/24/2017 1228   ALKPHOS 174 (H) 10/02/2019 1158   ALKPHOS 90 01/24/2017 1228   AST 14 (L) 10/02/2019 1158   AST 14 (L) 03/27/2018 1324   AST 16 01/24/2017 1228   ALT 26 10/02/2019 1158   ALT 18 03/27/2018 1324   ALT 14 01/24/2017 1228   BILITOT 0.4 10/02/2019 1158   BILITOT 0.2 (L) 03/27/2018 1324   BILITOT 0.34 01/24/2017 1228     I briefly looked at her imaging study from 2020

## 2019-10-03 NOTE — Progress Notes (Signed)
   Covid-19 Vaccination Clinic  Name:  [name]    MRN: [id number] DOB: 10-Sep-1955  10/03/2019  [name] was observed post Covid-19 immunization for 15 minutes without incident. She was provided with Vaccine Information Sheet and instruction to access the V-Safe system.   [name] was instructed to call 911 with any severe reactions post vaccine: [name] Kitchen Difficulty breathing  . Swelling of face and throat  . A fast heartbeat  . A bad rash all over body  . Dizziness and weakness   Immunizations Administered    Name Date Dose VIS Date Route   Pfizer COVID-19 Vaccine 10/03/2019  8:57 AM 0.3 mL 07/13/2019 Intramuscular   Manufacturer: [place]   Lot: [id number]   [place]: [address]-5

## 2019-10-03 NOTE — Assessment & Plan Note (Signed)
Her examination reveals some mild scarring and implant in situ but nothing to suggest cancer recurrence She is very concerned about her hair loss and wants to stop the summer to complete 4 years of treatment which I do not think is unreasonable I will see her once a year

## 2019-10-03 NOTE — Assessment & Plan Note (Signed)
CT imaging from last year was unremarkable Tumor marker is normal We will observe for now

## 2019-10-03 NOTE — Telephone Encounter (Signed)
-----   Message from Heath Lark, MD sent at 10/03/2019  7:31 AM EST ----- Regarding: CA-125 is normal, please call and let her know

## 2019-10-03 NOTE — Assessment & Plan Note (Signed)
She will continue risk factor modification We discussed the importance of dietary modification to get her diabetes under control and adequate fluid intake to prevent dehydration 

## 2019-10-05 ENCOUNTER — Ambulatory Visit
Admission: RE | Admit: 2019-10-05 | Discharge: 2019-10-05 | Disposition: A | Discharge status: Home / Self Care | Payer: BC Managed Care – PPO | Source: Ambulatory Visit | Attending: Nephrology | Admitting: Nephrology

## 2019-10-05 DIAGNOSIS — R6884 Jaw pain: Secondary | ICD-10-CM | POA: Diagnosis not present

## 2019-10-05 DIAGNOSIS — N1832 Chronic kidney disease, stage 3b: Secondary | ICD-10-CM

## 2019-10-05 DIAGNOSIS — R220 Localized swelling, mass and lump, head: Secondary | ICD-10-CM

## 2019-10-08 ENCOUNTER — Other Ambulatory Visit: Payer: Self-pay | Admitting: Family Medicine

## 2019-10-08 ENCOUNTER — Telehealth: Payer: Self-pay | Admitting: *Deleted

## 2019-10-08 DIAGNOSIS — G8929 Other chronic pain: Secondary | ICD-10-CM

## 2019-10-08 MED ORDER — PREGABALIN 50 MG PO CAPS: 50.0000 mg | ORAL_CAPSULE | Freq: Two times a day (BID) | ORAL | 1 refills | Status: DC

## 2019-10-08 NOTE — Telephone Encounter (Signed)
[facility] sent over a request for 90 day supply of Lyrica 50mg   Last written: 05/24/19 Last ov: 02/26/19 Next ov: none Contract: due UDS: due

## 2019-10-08 NOTE — Telephone Encounter (Signed)
done

## 2019-10-09 DIAGNOSIS — L89319 Pressure ulcer of right buttock, unspecified stage: Secondary | ICD-10-CM | POA: Diagnosis not present

## 2019-10-09 DIAGNOSIS — Z933 Colostomy status: Secondary | ICD-10-CM | POA: Diagnosis not present

## 2019-10-10 ENCOUNTER — Other Ambulatory Visit: Payer: Self-pay

## 2019-10-10 ENCOUNTER — Telehealth (INDEPENDENT_AMBULATORY_CARE_PROVIDER_SITE_OTHER): Payer: BC Managed Care – PPO | Admitting: Family Medicine

## 2019-10-10 DIAGNOSIS — R0602 Shortness of breath: Secondary | ICD-10-CM

## 2019-10-10 DIAGNOSIS — E1122 Type 2 diabetes mellitus with diabetic chronic kidney disease: Secondary | ICD-10-CM

## 2019-10-10 DIAGNOSIS — Z6835 Body mass index (BMI) 35.0-35.9, adult: Secondary | ICD-10-CM

## 2019-10-10 DIAGNOSIS — N183 Chronic kidney disease, stage 3 unspecified: Secondary | ICD-10-CM

## 2019-10-10 MED ORDER — RYBELSUS 3 MG PO TABS: 1.0000 | ORAL_TABLET | Freq: Every day | ORAL | 0 refills | Status: DC

## 2019-10-10 NOTE — Progress Notes (Signed)
TeleHealth Visit:  Due to the COVID-19 pandemic, this visit was completed with telemedicine (audio/video) technology to reduce patient and provider exposure as well as to preserve personal protective equipment.   [name] has verbally consented to this TeleHealth visit. The patient is located at home, the provider is located at the [facility] office. The participants in this visit include the listed provider and patient. The visit was conducted today via face time.   Chief Complaint: OBESITY [name] is here to discuss her progress with her obesity treatment plan along with follow-up of her obesity related diagnoses. [name] is on practicing portion control and making smarter food choices, such as increasing vegetables and decreasing simple carbohydrates and states she is following her eating plan approximately 0% of the time. [name] states she is walking around the house.  Today's visit was #: 28 Starting weight: 208 Starting date: 04/05/2018  Interim History: [name]'s weight at home was 192 lbs today. She feels her weight is creeping up in the wrong direction, and she is worried that she will regain her weight. She notes increased hunger even on her plan.  Subjective:   1. Type 2 diabetes mellitus with stage 3 chronic kidney disease, without long-term current use of insulin, unspecified whether stage 3a or 3b CKD ([place]) [name] states her fasting BGs have been elevated mostly averaging in 160's, but she had a 220 this morning. She is on Januvia and she is mostly doing well with diet.  2. SOB (shortness of breath) on exertion [name] notes increased shortness of breath intermittently, worse in the last 2 weeks. It is possible orthopnea, but this may be related to GERD. She was told she was on cardio toxic drugs for her cancer. She has a family history of coronal artery disease  Assessment/Plan:   1. Type 2 diabetes mellitus with stage 3 chronic kidney disease, without long-term  current use of insulin, unspecified whether stage 3a or 3b CKD (HCC) Good blood sugar control is important to decrease the likelihood of diabetic complications such as nephropathy, neuropathy, limb loss, blindness, coronary artery disease, and death. Intensive lifestyle modification including diet, exercise and weight loss are the first line of treatment for diabetes. [name] agreed to start Rybelsus 3 mg daily with no refills.  - Semaglutide (RYBELSUS) 3 MG TABS; Take 1 tablet by mouth daily.  Dispense: 30 tablet; Refill: 0  2. SOB (shortness of breath) on exertion [name] does feel that she gets out of breath more easily that she used to when she exercises. [name]'s shortness of breath appears to be obesity related and exercise induced. She has agreed to work on weight loss and gradually increase exercise to treat her exercise induced shortness of breath. We will refer to Cardiology. Will continue to monitor closely.  - Ambulatory referral to Cardiology  3. Class 2 severe obesity with serious comorbidity and body mass index (BMI) of 35.0 to 35.9 in adult, unspecified obesity type (HCC) [name] is currently in the action stage of change. As such, her goal is to continue with weight loss efforts. She has agreed to practicing portion control and making smarter food choices, such as increasing vegetables and decreasing simple carbohydrates.   Exercise goals: As is.  Behavioral modification strategies: increasing lean protein intake and no skipping meals.  [name] has agreed to follow-up with our clinic in 4 weeks. She was informed of the importance of frequent follow-up visits to maximize her success with intensive lifestyle modifications for her multiple health  conditions.  Objective:   VITALS: Per patient if applicable, see vitals. GENERAL: Alert and in no acute distress. CARDIOPULMONARY: No increased WOB. Speaking in clear sentences.  PSYCH: Pleasant and cooperative. Speech normal rate and rhythm.  Affect is appropriate. Insight and judgement are appropriate. Attention is focused, linear, and appropriate.  NEURO: Oriented as arrived to appointment on time with no prompting.   Lab Results  Component Value Date   CREATININE 2.27 (H) 10/02/2019   BUN 38 (H) 10/02/2019   NA 139 10/02/2019   K 3.4 (L) 10/02/2019   CL 111 10/02/2019   CO2 19 (L) 10/02/2019   Lab Results  Component Value Date   ALT 26 10/02/2019   AST 14 (L) 10/02/2019   ALKPHOS 174 (H) 10/02/2019   BILITOT 0.4 10/02/2019   Lab Results  Component Value Date   HGBA1C 7.9 (H) 09/01/2017   HGBA1C 5.3 04/01/2017   HGBA1C 5.8 (H) 09/05/2015   HGBA1C 6.8 (H) 11/21/2014   HGBA1C 6.3 (H) 11/07/2014   No results found for: INSULIN Lab Results  Component Value Date   TSH 2.408 08/30/2017   Lab Results  Component Value Date   CHOL 109 08/28/2015   HDL 43.80 08/28/2015   LDLCALC 45 08/28/2015   TRIG 89 04/18/2017   CHOLHDL 2 08/28/2015   Lab Results  Component Value Date   WBC 3.3 (L) 10/02/2019   HGB 12.4 10/02/2019   HCT 39.0 10/02/2019   MCV 97.7 10/02/2019   PLT 214 10/02/2019   Lab Results  Component Value Date   IRON 7 (L) 08/31/2017   TIBC 164 (L) 08/31/2017   FERRITIN 27 11/29/2014    Attestation Statements:   Reviewed by clinician on day of visit: allergies, medications, problem list, medical history, surgical history, family history, social history, and previous encounter notes.   I, Trixie Dredge, am acting as transcriptionist for Dennard Nip, MD.  I have reviewed the above documentation for accuracy and completeness, and I agree with the above. - Dennard Nip, MD

## 2019-10-12 ENCOUNTER — Encounter (INDEPENDENT_AMBULATORY_CARE_PROVIDER_SITE_OTHER): Payer: Self-pay | Admitting: Family Medicine

## 2019-10-15 NOTE — Telephone Encounter (Signed)
Please advise the medication is Rybelsus. Renee Ramus, LPN

## 2019-10-16 NOTE — Telephone Encounter (Signed)
Yes, ok to change to 90 days

## 2019-10-17 ENCOUNTER — Other Ambulatory Visit (INDEPENDENT_AMBULATORY_CARE_PROVIDER_SITE_OTHER): Payer: Self-pay

## 2019-10-17 DIAGNOSIS — N183 Chronic kidney disease, stage 3 unspecified: Secondary | ICD-10-CM

## 2019-10-17 DIAGNOSIS — E1122 Type 2 diabetes mellitus with diabetic chronic kidney disease: Secondary | ICD-10-CM

## 2019-10-17 MED ORDER — RYBELSUS 3 MG PO TABS: 1.0000 | ORAL_TABLET | Freq: Every day | ORAL | 0 refills | Status: DC

## 2019-10-18 IMAGING — XA IR EXCHANGE NEPHROSTOMY LEFT
1 series · 2 of 2 positions shown · non-contrast
Comparison: None.

INDICATION: 63-year-old with endometrial cancer and chronic bilateral
nephrostomy tubes. Patient presents for routine exchange.

EXAM:
EXCHANGE BILATERAL NEPHROSTOMY TUBES WITH FLUOROSCOPY

[Series 300: tube placements · 2 of 2 slices shown]
[im 1/2]
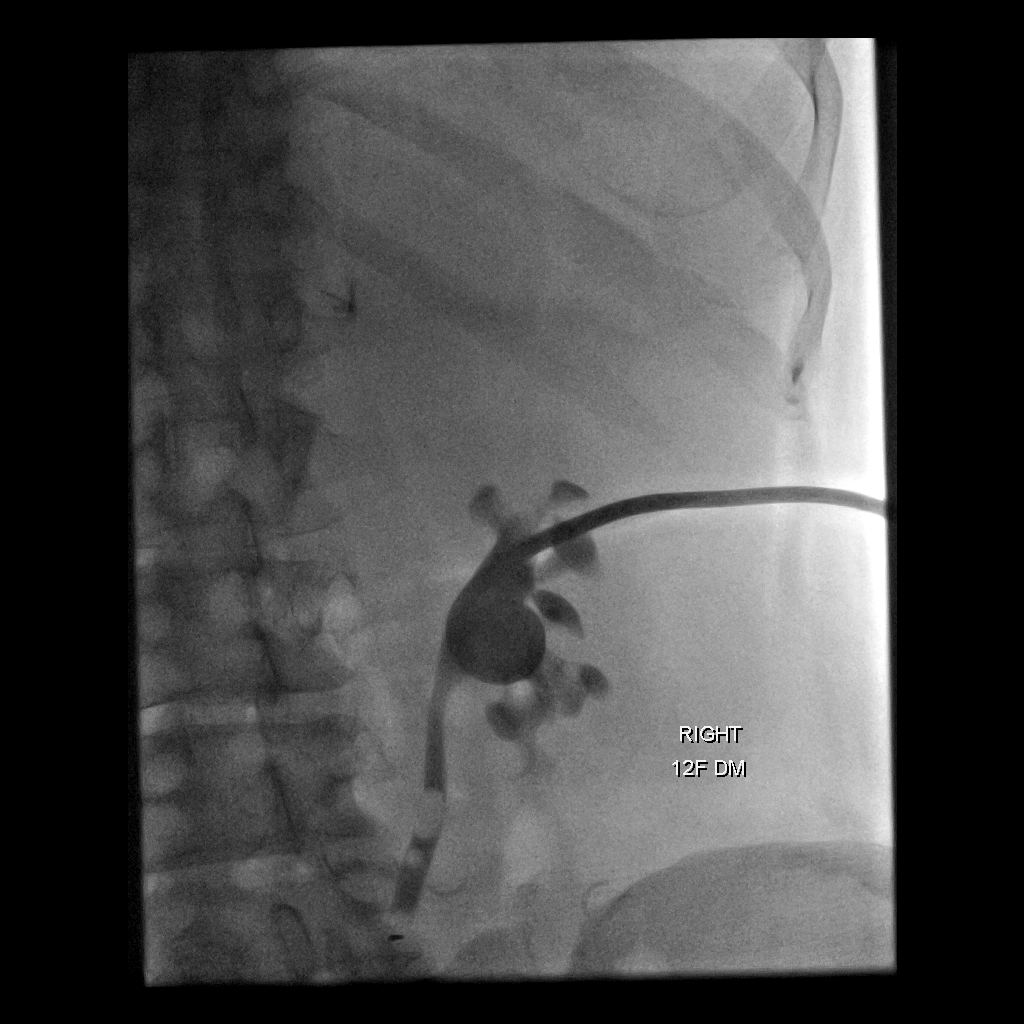
[im 2/2]
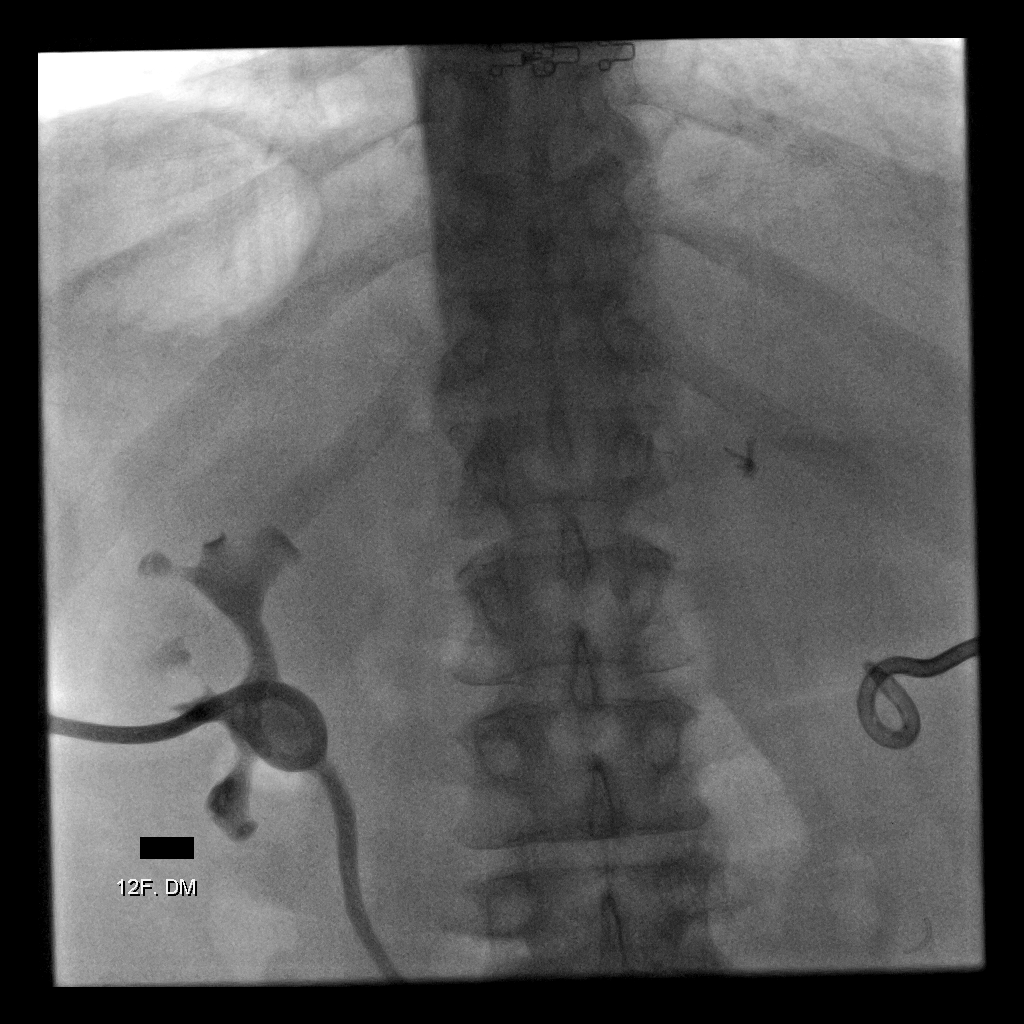

[2 of 2 positions shown; findings below may reference images not displayed]

MEDICATIONS:
None

ANESTHESIA/SEDATION:
None

CONTRAST:  50 mL Omnipaque 300-administered into the collecting
system(s)

FLUOROSCOPY TIME:  Fluoroscopy Time:  minutes  seconds ( mGy).

COMPLICATIONS:
None immediate.

PROCEDURE:
The procedure was explained to the patient. The risks and benefits
of the procedure were discussed and the patient's questions were
addressed. Informed consent was obtained from the patient. Patient
was placed prone on the interventional table. Bilateral nephrostomy
tubes were prepped and draped in sterile fashion. Maximal barrier
sterile technique was utilized including caps, mask, sterile gowns,
sterile gloves, sterile drape, hand hygiene and skin antiseptic.
Contrast was injected through the left nephrostomy tube. The
catheter was cut and removed over a Bentson wire. A new 10 Rappe
Nola Jacopin nephrostomy tube was easily advanced over the wire
and positioned in the renal pelvis. Contrast injection confirmed
placement in the renal pelvis. According to the patient, this
catheter felt better than the regular 12 French multipurpose drain.
Tube was secured to skin with the adhesive device and attached to a
gravity bag.

Contrast was injected through the right nephrostomy tube. The
catheter was cut and removed over a Bentson wire. New 12 Rappe
Nola Jacopin catheter was easily advanced over the wire and
reconstituted in the renal pelvis. Contrast injection confirmed
placement in the renal pelvis. Catheter was attached to a bag and
secured to skin with the adhesive device.

Fluoroscopic images were taken and saved for this procedure.
IMPRESSION: Successful exchange of bilateral nephrostomy tubes fluoroscopy.
Patient has bilateral 12 Felner Ceola catheters.

## 2019-10-26 DIAGNOSIS — Z86010 Personal history of colonic polyps: Secondary | ICD-10-CM | POA: Diagnosis not present

## 2019-10-26 DIAGNOSIS — R143 Flatulence: Secondary | ICD-10-CM | POA: Diagnosis not present

## 2019-11-02 DIAGNOSIS — L89319 Pressure ulcer of right buttock, unspecified stage: Secondary | ICD-10-CM | POA: Diagnosis not present

## 2019-11-02 DIAGNOSIS — Z4801 Encounter for change or removal of surgical wound dressing: Secondary | ICD-10-CM | POA: Diagnosis not present

## 2019-11-02 DIAGNOSIS — Z933 Colostomy status: Secondary | ICD-10-CM | POA: Diagnosis not present

## 2019-11-02 DIAGNOSIS — Z936 Other artificial openings of urinary tract status: Secondary | ICD-10-CM | POA: Diagnosis not present

## 2019-11-07 DIAGNOSIS — N1832 Chronic kidney disease, stage 3b: Secondary | ICD-10-CM | POA: Diagnosis not present

## 2019-11-07 DIAGNOSIS — E119 Type 2 diabetes mellitus without complications: Secondary | ICD-10-CM | POA: Diagnosis not present

## 2019-11-07 DIAGNOSIS — D631 Anemia in chronic kidney disease: Secondary | ICD-10-CM | POA: Diagnosis not present

## 2019-11-07 DIAGNOSIS — N189 Chronic kidney disease, unspecified: Secondary | ICD-10-CM | POA: Diagnosis not present

## 2019-11-07 DIAGNOSIS — E872 Acidosis: Secondary | ICD-10-CM | POA: Diagnosis not present

## 2019-11-07 MED FILL — LEVOTHYROXINE SODIUM 150 MC: 150 | 90 days supply | Qty: 90 | Fill #0

## 2019-11-08 ENCOUNTER — Ambulatory Visit (INDEPENDENT_AMBULATORY_CARE_PROVIDER_SITE_OTHER): Payer: BC Managed Care – PPO | Admitting: Family Medicine

## 2019-11-08 ENCOUNTER — Other Ambulatory Visit: Payer: Self-pay

## 2019-11-08 ENCOUNTER — Encounter (INDEPENDENT_AMBULATORY_CARE_PROVIDER_SITE_OTHER): Payer: Self-pay | Admitting: Family Medicine

## 2019-11-08 VITALS — BP 124/71 | HR 70 | Temp 98.0°F | Ht 62.0 in | Wt 197.0 lb

## 2019-11-08 DIAGNOSIS — Z6836 Body mass index (BMI) 36.0-36.9, adult: Secondary | ICD-10-CM

## 2019-11-08 DIAGNOSIS — R0602 Shortness of breath: Secondary | ICD-10-CM | POA: Diagnosis not present

## 2019-11-08 DIAGNOSIS — E119 Type 2 diabetes mellitus without complications: Secondary | ICD-10-CM | POA: Diagnosis not present

## 2019-11-08 DIAGNOSIS — E860 Dehydration: Secondary | ICD-10-CM

## 2019-11-08 MED ORDER — ALBUTEROL SULFATE HFA 108 (90 BASE) MCG/ACT IN AERS: 1.0000 | INHALATION_SPRAY | Freq: Four times a day (QID) | RESPIRATORY_TRACT | 2 refills | Status: DC | PRN

## 2019-11-08 NOTE — Progress Notes (Signed)
Chief Complaint:   OBESITY [name] is here to discuss her progress with her obesity treatment plan along with follow-up of her obesity related diagnoses. [name] is on practicing portion control and making smarter food choices, such as increasing vegetables and decreasing simple carbohydrates and states she is following her eating plan approximately 50% of the time. [name] states she is walking for 15 minutes 2-3 times per week.  Today's visit was #: 70 Starting weight: 208 lbs Starting date: 04/05/2018 Today's weight: 197 lbs Today's date: 11/08/2019 Total lbs lost to date: 11 Total lbs lost since last in-office visit: 0  Interim History: [name]'s last visit was approximately 1 year ago. She has sone telehealth visits since then and has minimized her weight gain in the last year during [employer]. She is ready to work on her nutrition again and her next weight loss goal is to lose down to 180 lbs.  Subjective:   1. Type 2 diabetes mellitus with complications, without long-term current use of insulin ([name]) [name] started Rybelsus 3 mg and seems to be tolerating this well. She is still having diarrhea but this hasn't worsened. Her fasting BGs range mostly in 120's. She is ready to get back to a structured eating plan.  2. Dehydration [name] is struggling to drink 60+ oz of fluid per day, and her BUN and creatinine is elevated and GFR is low. She is status post nephrostomy and colostomy and is losing fluid through her colostomy bag which is being followed by GI.  3. Shortness of breath on exertion [name] has a complicated medical history and has a level of exercise intolerance. She has been on Ventolin in the past not for breathing issues but for wound healing and she requests a refill.  Assessment/Plan:   1. Type 2 diabetes mellitus with complications, without long-term current use of insulin (HCC) Good blood sugar control is important to decrease the likelihood of diabetic complications such as  nephropathy, neuropathy, limb loss, blindness, coronary artery disease, and death. Intensive lifestyle modification including diet, exercise and weight loss are the first line of treatment for diabetes. [name] will continue her medications, and will get back to the Category 2 plan and will follow closely.  2. Dehydration [name] is to work on increasing her water intake to at least 80 oz of calorie and caffeine free liquids per day. She is to continue to follow up with GI and Nephrology.  3. Shortness of breath on exertion [name] does feel that she gets out of breath more easily that she used to when she exercises. [name]'s shortness of breath appears to be obesity related and exercise induced. She has agreed to work on weight loss and gradually increase exercise to treat her exercise induced shortness of breath. [name] agreed to restart Ventolin 108 (90 base) mcg 1 puff every 6 hours as needed with 90 day supply with no refills. Will continue to monitor closely.  4. Class 2 severe obesity with serious comorbidity and body mass index (BMI) of 36.0 to 36.9 in adult, unspecified obesity type (HCC) [name] is currently in the action stage of change. As such, her goal is to continue with weight loss efforts. She has agreed to change to the Category 2 Plan.   Exercise goals: As is.  Behavioral modification strategies: increasing water intake and no skipping meals.  [name] has agreed to follow-up with our clinic in 3 weeks. She was informed of the importance of frequent follow-up visits to maximize her success with  intensive lifestyle modifications for her multiple health conditions.   Objective:   Blood pressure 124/71, pulse 70, temperature 98 F (36.7 C), temperature source Oral, height 5\' 2"  (1.575 m), weight 197 lb (89.4 kg), SpO2 100 %. Body mass index is 36.03 kg/m.  General: Cooperative, alert, well developed, in no acute distress. HEENT: Conjunctivae and lids unremarkable. Cardiovascular: Regular  rhythm.  Lungs: Normal work of breathing. Neurologic: No focal deficits.   Lab Results  Component Value Date   CREATININE 2.27 (H) 10/02/2019   BUN 38 (H) 10/02/2019   NA 139 10/02/2019   K 3.4 (L) 10/02/2019   CL 111 10/02/2019   CO2 19 (L) 10/02/2019   Lab Results  Component Value Date   ALT 26 10/02/2019   AST 14 (L) 10/02/2019   ALKPHOS 174 (H) 10/02/2019   BILITOT 0.4 10/02/2019   Lab Results  Component Value Date   HGBA1C 7.9 (H) 09/01/2017   HGBA1C 5.3 04/01/2017   HGBA1C 5.8 (H) 09/05/2015   HGBA1C 6.8 (H) 11/21/2014   HGBA1C 6.3 (H) 11/07/2014   No results found for: INSULIN Lab Results  Component Value Date   TSH 2.408 08/30/2017   Lab Results  Component Value Date   CHOL 109 08/28/2015   HDL 43.80 08/28/2015   LDLCALC 45 08/28/2015   TRIG 89 04/18/2017   CHOLHDL 2 08/28/2015   Lab Results  Component Value Date   WBC 3.3 (L) 10/02/2019   HGB 12.4 10/02/2019   HCT 39.0 10/02/2019   MCV 97.7 10/02/2019   PLT 214 10/02/2019   Lab Results  Component Value Date   IRON 7 (L) 08/31/2017   TIBC 164 (L) 08/31/2017   FERRITIN 27 11/29/2014   Attestation Statements:   Reviewed by clinician on day of visit: allergies, medications, problem list, medical history, surgical history, family history, social history, and previous encounter notes.  Time spent on visit including pre-visit chart review and post-visit care and charting was 40 minutes.    I, Trixie Dredge, am acting as transcriptionist for Dennard Nip, MD.  I have reviewed the above documentation for accuracy and completeness, and I agree with the above. -  Dennard Nip, MD

## 2019-11-09 ENCOUNTER — Telehealth: Payer: Self-pay | Admitting: Interventional Cardiology

## 2019-11-09 NOTE — Telephone Encounter (Signed)
Patient would like her husband to come with her to her appt.  She states she is still physically weak from years of being homebound.

## 2019-11-09 NOTE — Telephone Encounter (Signed)
Left message letting pt know ok for husband to accompany.  Advised to call back if any questions.

## 2019-11-12 IMAGING — XA IR EXCHANGE NEPHROSTOMY LEFT
2 series · 9 of 9 positions shown · non-contrast
Comparison: CT 10/04/2018

INDICATION: 63-year-old female with a history of bilateral percutaneous
nephrostomy with malfunction on the left

EXAM:
IR EXCHANGE NEPHROSTOMY RIGHT; IR EXCHANGE NEPHROSTOMY LEFT

[Series 2: fl - angio · 4 of 95 frames shown]
[frame 11/95]
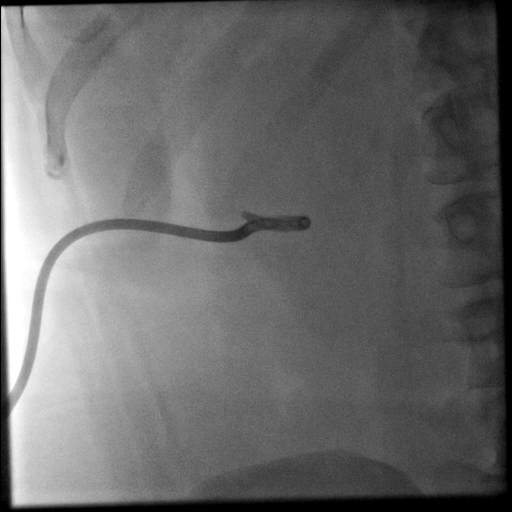
[frame 15/95]
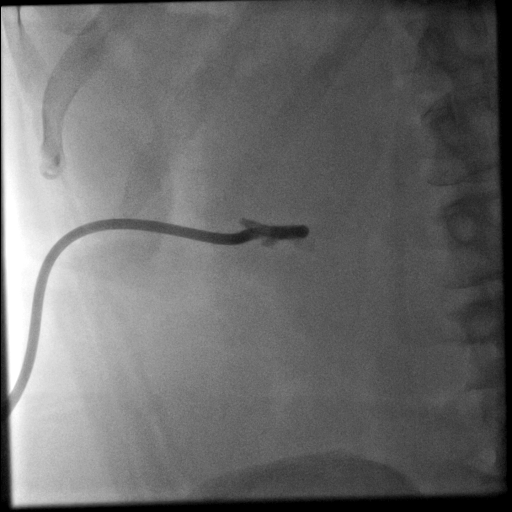
[frame 48/95]
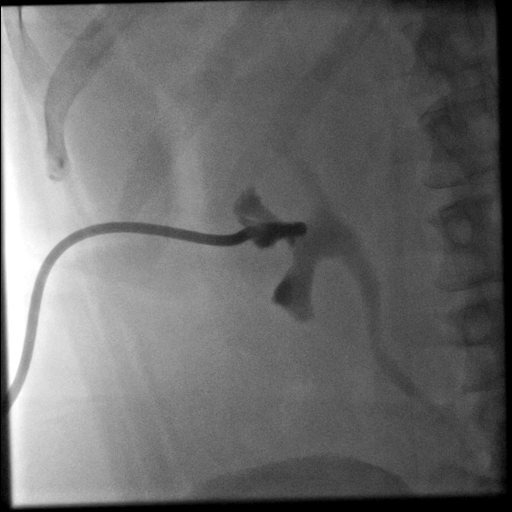
[frame 81/95]
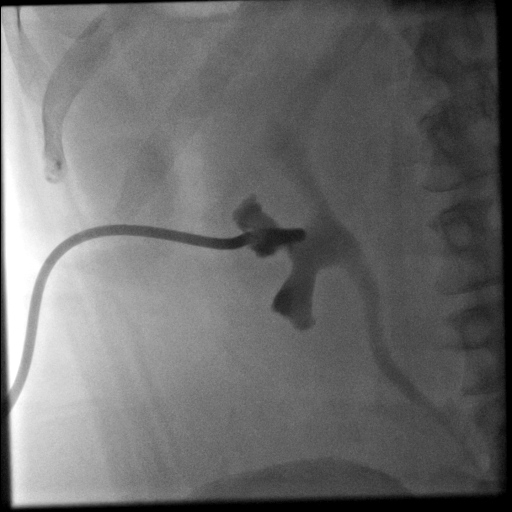

[Series 300: ir nephrostogram left thru existing acce · 5 of 5 slices shown]
[im 1/5]
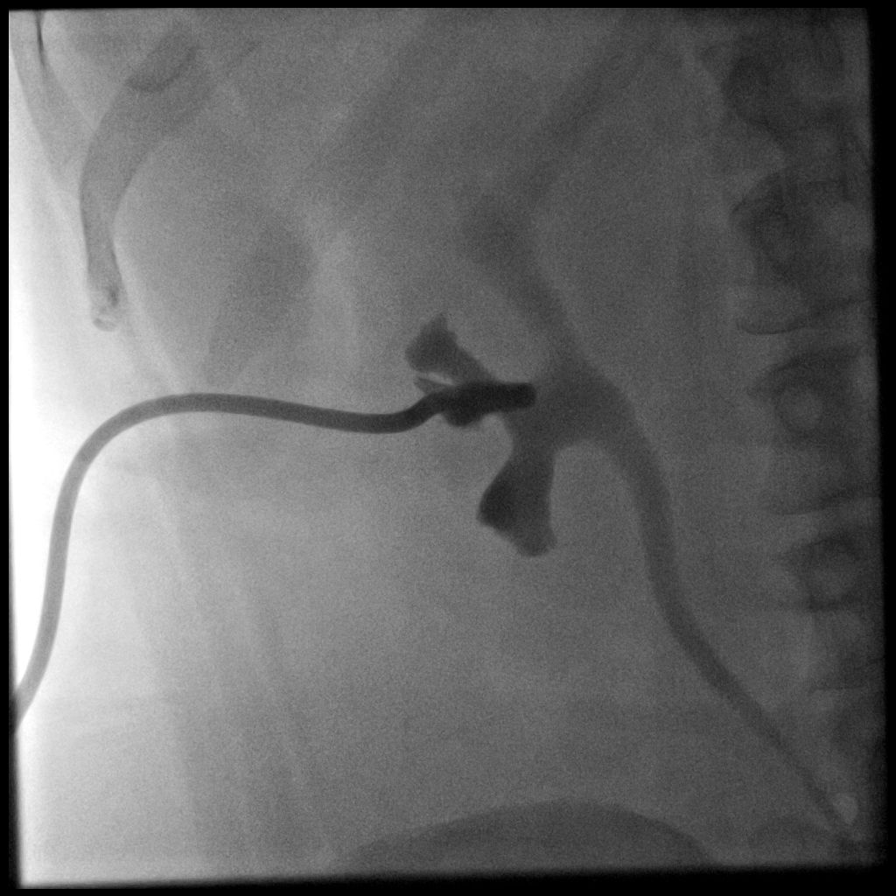
[im 2/5]
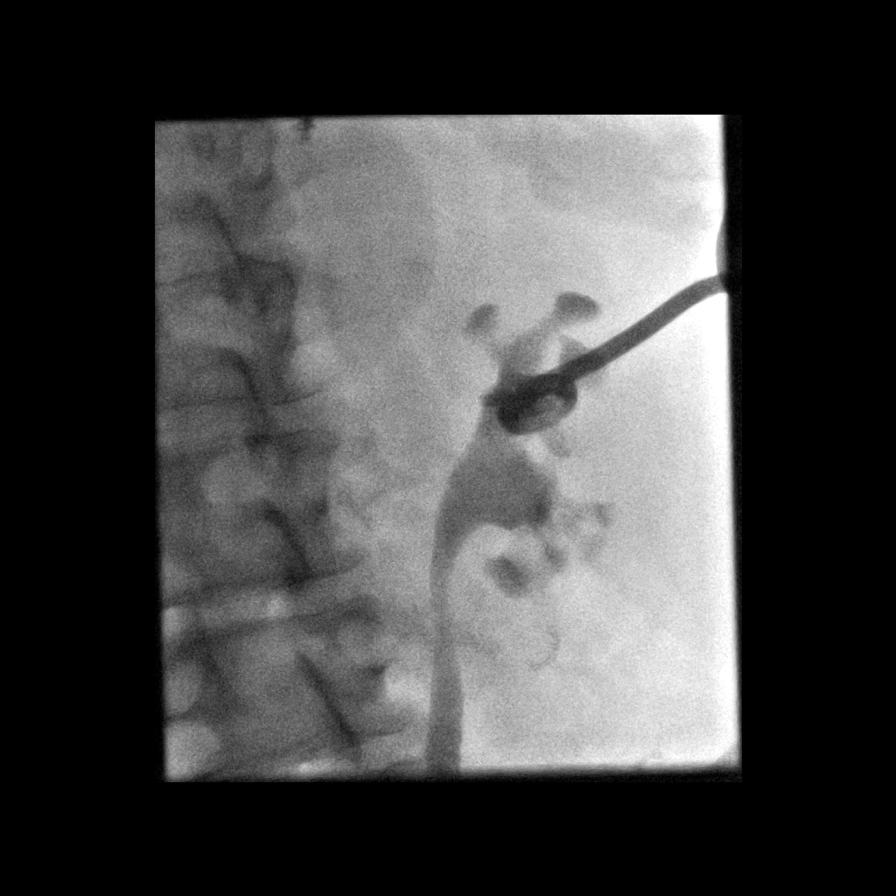
[im 3/5]
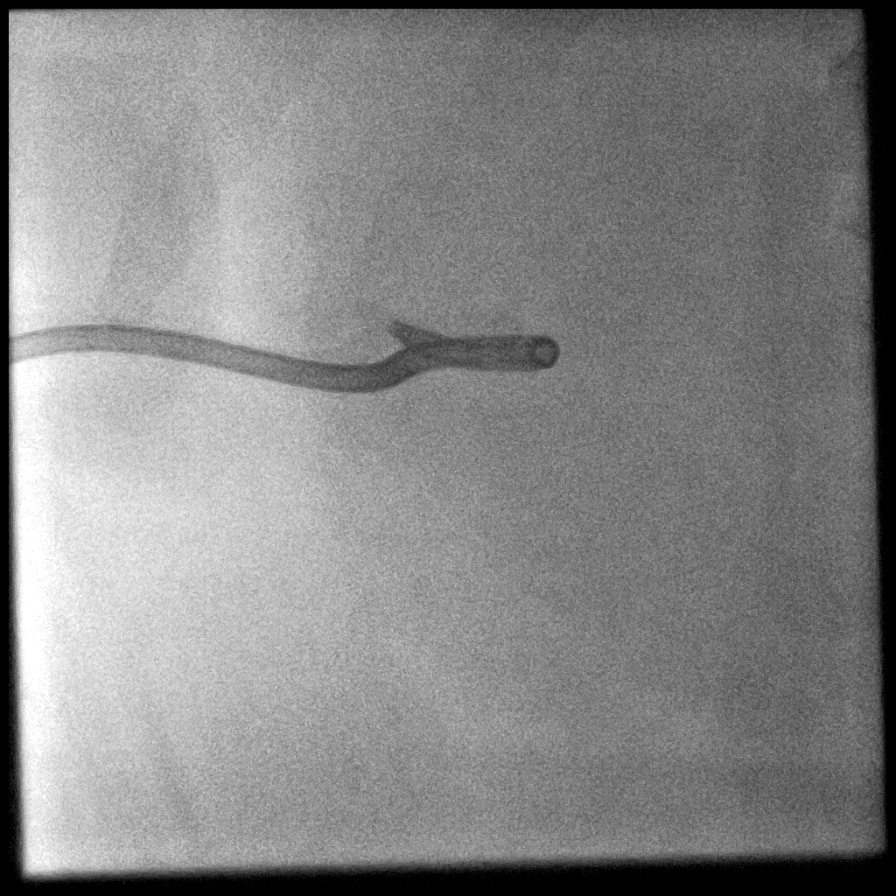
[im 4/5]
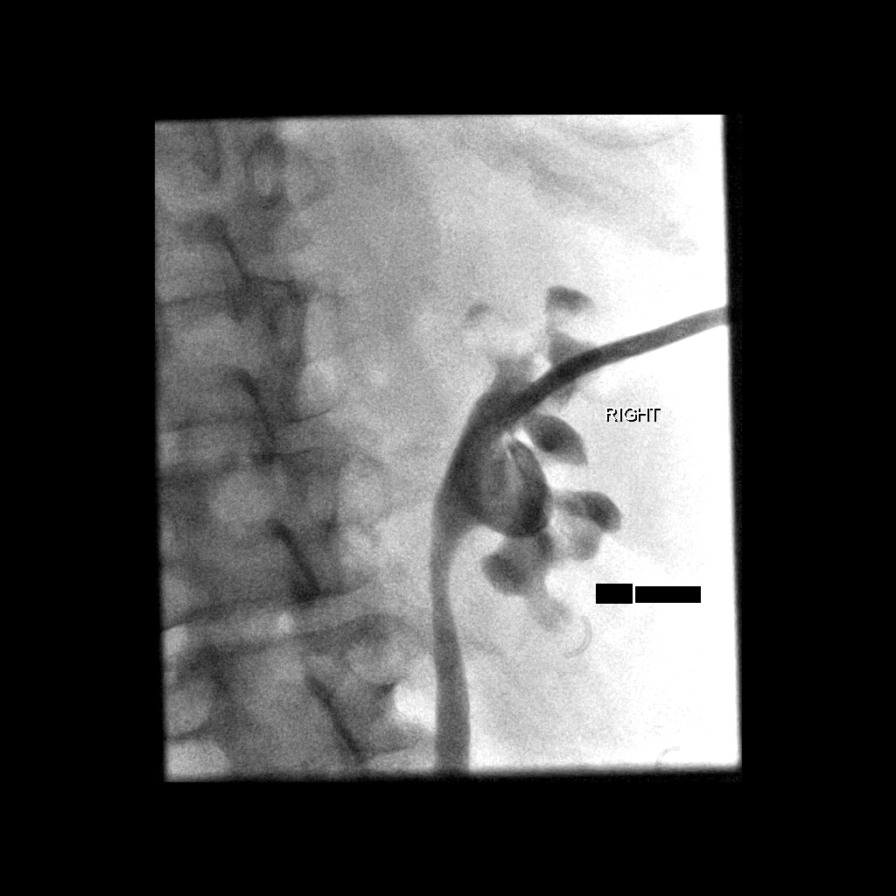
[im 5/5]
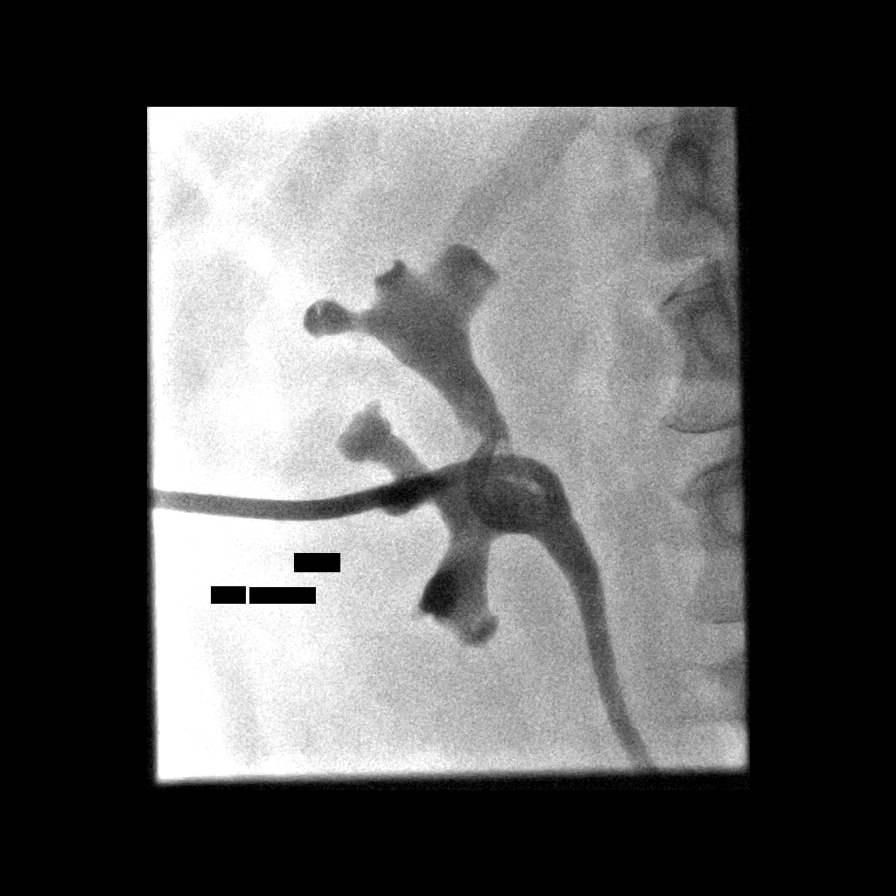

[9 of 9 positions shown; findings below may reference images not displayed]

MEDICATIONS:
None

ANESTHESIA/SEDATION:
None

CONTRAST:  10 cc-administered into the collecting system(s)

FLUOROSCOPY TIME:  Fluoroscopy Time: 3 minutes 18 seconds (37 mGy).

COMPLICATIONS:
None

PROCEDURE:
Informed written consent was obtained from the patient after a
thorough discussion of the procedural risks, benefits and
alternatives. All questions were addressed. Maximal Sterile Barrier
Technique was utilized including caps, mask, sterile gowns, sterile
gloves, sterile drape, hand hygiene and skin antiseptic. A timeout
was performed prior to the initiation of the procedure.

Patient position prone position on the fluoroscopy table. Scout
images were acquired.

The bilateral indwelling tube were prepped and draped in the usual
sterile fashion.

On the left, contrast was infused confirming location within the
collecting. The catheter appears to have been withdrawn slightly
from the pelvis into a calyx. Bentson wire was used to exchange for
a new 12 Usha Montalbano.

Catheter was attached to gravity drainage.

On the right, contrast was infused through the drain confirming
location within the collecting system. A new 12 Berit Helen Drager
Zengeni catheter was placed on the Bentson wire.

Catheter was attached to gravity drainage.

Patient tolerated the procedure well and remained hemodynamically
stable throughout.

No complications were encountered and no significant blood loss.
IMPRESSION: Status post routine exchange of bilateral percutaneous nephrostomy,
with placement of 12 Usha Montalbano pigtail catheters.

The exchange was initiated secondary to malfunction on the left.
This was attributable to withdrawal of the pigtail catheter into a
calyx, likely leading to at least partial obstruction/blockage.

## 2019-11-13 NOTE — Progress Notes (Signed)
Cardiology Office Note:    Date:  11/14/2019   ID:  [name], DOB 1956-02-11, MRN [id number]  PCP:  Ann Held, DO  Cardiologist:  No primary care provider on file.   Referring MD: Starlyn Skeans, MD   Chief Complaint  Patient presents with  . Shortness of Breath  . Irregular Heart Beat  . Dizziness    History of Present Illness:    [name] is a 64 y.o. 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  She has a history of complicated endometrial and breast cancer with intra-abdominal complications following cancer surgery.  She subsequently had to have ileostomy and cystostomy.  She is here for cardiac evaluation due to occasional lightheadedness, palpitations, and dyspnea with associated cough.  She is referred by her primary physician Veronda Prude.  The patient has a several month history of orthostatic dizziness.  Dizziness is not associated with palpitations or other complaints.  She has also noted palpitations more at rest than with exertion.  When she was receiving Zarxio, palpitations were much worse for the the day or 2 after therapy.  She is now switched to Granix and has not noticed much in the way of palpitations at all.  These therapies were used for suppression of ovarian/uterine cancer.  She has no prior history of atrial fibrillation or other cardiac arrhythmia.  Shortness of breath occurs with activity.  She is trying to build her endurance.  She has been physically and active.  There is no associated chest pain.  She has not had edema.  Because of abdominal scarring and recent surgery she does not lie flat because the incision in the abdomen seems to pull.  She is able to lie relatively flat and has no orthopnea.  There is no prior history of heart disease.  There is distant family history of coronary disease involving her paternal grandfather who had an MI in his 69s,  mother has hypertension, father had hypertension and required a pacemaker, and a brother also has hypertension.  According to her husband she sleeps well.  She does not snore.  She is diabetic.  She does not smoke.   Past Medical History:  Diagnosis Date  . Adrenal adenoma, left 02/08/2016   CT: stable benign  . Anemia in neoplastic disease   . Back pain   . Benign essential HTN   . 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 Cancer of corpus uteri, except isthmus [facility])  oncologist-- dr Denman [name] and dr Alvy Bimler    10-15-2004  dx endometroid endometrial and ovarian cancer s/p  chemotheapy and surgery(TAH w/ BSO) :  recurrent 11-19-2014 post pelvic surgery and radiation 01-29-2015 to 03-10-2015  . Chronic idiopathic neutropenia (HCC)    presumed related to chemotherapy March 2006--- followed by dr Alvy Bimler (treatment w/ G-CSF injections  . Chronic nausea   . Chronic pain    perineal/ anal  area from bladder pad irritates skin , right flank pain  . CKD stage G2/A3, GFR 60-89 and albumin creatinine ratio >300 mg/g    nephrologist-  dr Madelon Lips  . Colovesical fistula   . Diabetic retinopathy, background ([place])   . Difficult intravenous access    small veins---  hx PICC lines  . DM type 2 (diabetes mellitus, type 2) ([place])    monitored by dr Legrand Como altheimer  . Dysuria   . Environmental and seasonal allergies   . Fatty liver 02/08/2016   CT  . Generalized muscle weakness   . GERD (gastroesophageal reflux disease)   . Hiatal hernia   . History of abdominal abscess 04/16/2017   post surgery 04-01-2017  --- resolved 10/ 2018  . History of gastric polyp    2014  duodenum  . History of ileus 04/16/2017   resolved w/ no surgical intervention  . History of radiation therapy    01-29-2015 to 03-10-2015   pelvis 50.4Gy  . Hypothyroidism    monitored by dr Legrand Como altheimer  . IBS (irritable bowel syndrome)   . Ileostomy in place [facility]) 04/01/2017   created at same time colostomy takedown.  . Joint pain   . Leg edema   . Lower urinary tract symptoms (LUTS)    urge urinary  incontinence  . Mixed dyslipidemia   . Multiple thyroid nodules    Managed by Dr. Harlow Asa  . Nephrostomy status ([place])   . Palpitations   . Pelvic abscess in female 04/16/2017  . PONV (postoperative nausea and vomiting)    "scopolamine patch works for me"  . Radiation-induced dermatitis    contact dermatitis , radiation completed, rash only on ankles now.  . SBO (small bowel obstruction) ([place]) 01/2019  . Seasonal allergies   . Ureteral stricture, right UROLOGIT-  DR [facility]   CHRONIC--  TREATMENT URETERAL STENT  . 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 Vitamin D deficiency   . Wears glasses     Past Surgical History:  Procedure Laterality Date  . APPENDECTOMY    . biopsy thyroid nodules    . BREAST RECONSTRUCTION WITH PLACEMENT OF TISSUE EXPANDER AND FLEX HD (ACELLULAR HYDRATED DERMIS) Bilateral 12/11/2015   Procedure: BILATERAL BREAST RECONSTRUCTION WITH PLACEMENT OF TISSUE EXPANDERS;  Surgeon: Irene Limbo, MD;  Location: [place];  Service: Plastics;  Laterality: Bilateral;  . COLONOSCOPY WITH PROPOFOL N/A 08/21/2013   Procedure: COLONOSCOPY WITH PROPOFOL;  Surgeon: Cleotis Nipper, MD;  [facility];  Service: Endoscopy;  Laterality: N/A;  . COLOSTOMY TAKEDOWN N/A 12/04/2014   Procedure: LAPROSCOPIC LYSIS OF ADHESIONS, SPLENIC MOBILIZATION, RELOCATION OF COLOSTOMY, DEBRIDEMENT INITIAL COLOSTOMY SITE;  Surgeon: Michael [name], MD;  [facility];  Service: General;  Laterality: N/A;  . CYSTOGRAM N/A 06/01/2017   Procedure: CYSTOGRAM;  Surgeon: Alexis Frock, MD;  [facility];  Service: Urology;  Laterality: N/A;  . CYSTOSCOPY W/ RETROGRADES Right 11/21/2015   Procedure: CYSTOSCOPY WITH  RETROGRADE PYELOGRAM;  Surgeon: Alexis Frock, MD;  [facility];  Service: Urology;  Laterality: Right;  . CYSTOSCOPY W/ URETERAL STENT PLACEMENT Right 11/21/2015   Procedure: CYSTOSCOPY WITH STENT REPLACEMENT;  Surgeon: Alexis Frock, MD;  [facility];  Service: Urology;  Laterality: Right;  . CYSTOSCOPY W/ URETERAL STENT PLACEMENT Right 03/10/2016   Procedure: CYSTOSCOPY WITH STENT REPLACEMENT;  Surgeon: Alexis Frock, MD;  Location: [facility];  Service: Urology;  Laterality: Right;  . CYSTOSCOPY W/ URETERAL STENT PLACEMENT Right 06/30/2016   Procedure: CYSTOSCOPY WITH RETROGRADE PYELOGRAM/URETERAL STENT EXCHANGE;  Surgeon: Alexis Frock, MD;  Location: [facility];  Service: Urology;  Laterality: Right;  . CYSTOSCOPY W/ URETERAL STENT PLACEMENT N/A 06/01/2017   Procedure: CYSTOSCOPY WITH EXAM UNDER ANESTHESIA;  Surgeon: Alexis Frock, MD;  [facility];  Service: Urology;  Laterality: N/A;  . CYSTOSCOPY W/ URETERAL STENT PLACEMENT Right 08/17/2017   Procedure: CYSTOSCOPY WITH RETROGRADE PYELOGRAM/URETERAL STENT REMOVAL;  Surgeon: Alexis Frock, MD;  Location: [facility];  Service: Urology;  Laterality: Right;  . CYSTOSCOPY WITH RETROGRADE PYELOGRAM, URETEROSCOPY AND STENT PLACEMENT Right 03/20/2015   Procedure: CYSTOSCOPY WITH RETROGRADE PYELOGRAM, URETEROSCOPY WITH BALLOON DILATION AND STENT PLACEMENT ON RIGHT;  Surgeon: Alexis Frock, MD;  [facility];  Service: Urology;  Laterality: Right;  . CYSTOSCOPY WITH RETROGRADE PYELOGRAM, URETEROSCOPY AND STENT PLACEMENT Right 05/02/2015   Procedure: CYSTOSCOPY WITH RIGHT RETROGRADE PYELOGRAM,  DIAGNOSTIC URETEROSCOPY AND STENT PULL ;  Surgeon: Alexis Frock, MD;  Location: [facility];  Service: Urology;  Laterality: Right;  . CYSTOSCOPY WITH RETROGRADE PYELOGRAM, URETEROSCOPY AND STENT PLACEMENT Right 09/05/2015   Procedure: CYSTOSCOPY WITH  RETROGRADE PYELOGRAM,  AND STENT PLACEMENT;  Surgeon: Alexis Frock, MD;  [facility];  Service: Urology;  Laterality: Right;  . CYSTOSCOPY WITH RETROGRADE PYELOGRAM, URETEROSCOPY AND STENT PLACEMENT Right 04/01/2017   Procedure: CYSTOSCOPY WITH RETROGRADE PYELOGRAM, URETEROSCOPY AND STENT PLACEMENT;  Surgeon: Alexis Frock, MD;  [facility];  Service: Urology;  Laterality: Right;  . CYSTOSCOPY WITH STENT PLACEMENT Right 10/27/2016   Procedure: CYSTOSCOPY WITH STENT CHANGE and right retrograde pyelogram;  Surgeon: Alexis Frock, MD;  Location[facility];  Service: Urology;  Laterality: Right;  . EUS N/A 10/02/2014   Procedure: LOWER ENDOSCOPIC ULTRASOUND (EUS);  Surgeon: Arta Silence, MD;  Location: [facility];  Service: Endoscopy;  Laterality: N/A;  . EXCISION SOFT TISSUE MASS RIGHT FOREMAN  12-08-2006  . EYE SURGERY  as child   pytosis of eyelids repair  . INCISION AND DRAINAGE OF WOUND Bilateral 12/26/2015   Procedure: DEBRIDEMENT OF BILATERAL MASTECTOMY FLAPS;  Surgeon: Irene Limbo, MD;  Location: [place];  Service: Plastics;  Laterality: Bilateral;  . IR CV LINE INJECTION  05/31/2017  . IR FLUORO GUIDE CV LINE LEFT  05/31/2017  . IR FLUORO GUIDE CV LINE RIGHT  04/06/2017  . IR FLUORO GUIDE CV MIDLINE PICC RIGHT  05/30/2017  . IR NEPHROSTOGRAM LEFT INITIAL PLACEMENT  09/02/2017  . IR NEPHROSTOGRAM LEFT THRU EXISTING ACCESS  11/29/2017  . IR NEPHROSTOGRAM RIGHT INITIAL PLACEMENT  09/02/2017  . IR NEPHROSTOGRAM RIGHT THRU EXISTING ACCESS  09/13/2017  . IR NEPHROSTOGRAM RIGHT THRU EXISTING ACCESS  11/29/2017  . IR NEPHROSTOMY EXCHANGE LEFT  11/28/2017  . IR NEPHROSTOMY EXCHANGE LEFT  01/05/2018  . IR NEPHROSTOMY EXCHANGE LEFT  02/16/2018  . IR NEPHROSTOMY EXCHANGE LEFT  03/30/2018  . IR NEPHROSTOMY EXCHANGE LEFT  05/12/2018  . IR NEPHROSTOMY EXCHANGE LEFT  06/21/2018  . IR NEPHROSTOMY EXCHANGE LEFT  08/04/2018  . IR NEPHROSTOMY EXCHANGE LEFT   09/18/2018  . IR NEPHROSTOMY EXCHANGE LEFT  10/09/2018  . IR NEPHROSTOMY EXCHANGE LEFT  10/27/2018  . IR NEPHROSTOMY EXCHANGE LEFT  11/21/2018  . IR NEPHROSTOMY EXCHANGE LEFT  01/05/2019  . IR NEPHROSTOMY EXCHANGE LEFT  02/15/2019  . IR NEPHROSTOMY EXCHANGE LEFT  03/29/2019  . IR NEPHROSTOMY EXCHANGE RIGHT  10/02/2017  . IR NEPHROSTOMY EXCHANGE RIGHT  11/28/2017  . IR NEPHROSTOMY EXCHANGE RIGHT  01/05/2018  . IR NEPHROSTOMY EXCHANGE RIGHT  02/16/2018  . IR NEPHROSTOMY EXCHANGE RIGHT  03/30/2018  . IR NEPHROSTOMY EXCHANGE RIGHT  05/12/2018  . IR NEPHROSTOMY EXCHANGE RIGHT  06/21/2018  . IR NEPHROSTOMY EXCHANGE RIGHT  08/04/2018  . IR NEPHROSTOMY EXCHANGE RIGHT  09/18/2018  . IR NEPHROSTOMY EXCHANGE  RIGHT  10/27/2018  . IR NEPHROSTOMY EXCHANGE RIGHT  11/21/2018  . IR NEPHROSTOMY EXCHANGE RIGHT  01/05/2019  . IR NEPHROSTOMY EXCHANGE RIGHT  02/15/2019  . IR NEPHROSTOMY EXCHANGE RIGHT  03/29/2019  . IR NEPHROSTOMY PLACEMENT LEFT  10/02/2017  . IR RADIOLOGIST EVAL & MGMT  05/03/2017  . IR US GUIDE VASC ACCESS LEFT  05/31/2017  . IR US GUIDE VASC ACCESS RIGHT  04/06/2017  . IR US GUIDE VASC ACCESS RIGHT  05/30/2017  . LAPAROSCOPIC CHOLECYSTECTOMY  1990  . LIPOSUCTION WITH LIPOFILLING Bilateral 04/16/2016   Procedure: LIPOSUCTION WITH LIPOFILLING TO BILATERAL CHEST;  Surgeon: Irene Limbo, MD;  Location: [place];  Service: Plastics;  Laterality: Bilateral;  . MASTECTOMY W/ SENTINEL NODE BIOPSY Bilateral 12/11/2015   Procedure: RIGHT PROPHYLACTIC MASTECTOMY, LEFT TOTAL MASTECTOMY WITH LEFT AXILLARY SENTINEL LYMPH NODE BIOPSY;  Surgeon: Stark Klein, MD;  Location: [place];  Service: General;  Laterality: Bilateral;  . OSTOMY N/A 11/19/2014   Procedure: OSTOMY;  Surgeon: Michael [name], MD;  [facility];  Service: General;  Laterality: N/A;  . PROCTOSCOPY N/A 04/01/2017   Procedure: RIDGE PROCTOSCOPY;  Surgeon: Michael [name], MD;  [facility];  Service: General;  Laterality: N/A;  . REMOVAL OF  BILATERAL TISSUE EXPANDERS WITH PLACEMENT OF BILATERAL BREAST IMPLANTS Bilateral 04/16/2016   Procedure: REMOVAL OF BILATERAL TISSUE EXPANDERS WITH PLACEMENT OF BILATERAL BREAST IMPLANTS;  Surgeon: Irene Limbo, MD;  Location: [place];  Service: Plastics;  Laterality: Bilateral;  . ROBOTIC ASSISTED LAP VAGINAL HYSTERECTOMY N/A 11/19/2014   Procedure: ROBOTIC LYSIS OF ADHESIONS, CONVERTED TO LAPAROTOMY RADICAL UPPER VAGINECTOMY,LOW ANTERIOR BOWEL RESECTION, COLOSTOMY, BILATERAL URETERAL STENT PLACEMENT AND CYSTONOMY CLOSURE;  Surgeon: Everitt Amber, MD;  [facility];  Service: Gynecology;  Laterality: N/A;  . TISSUE EXPANDER FILLING Bilateral 12/26/2015   Procedure: EXPANSION OF BILATERAL CHEST TISSUE EXPANDERS (60 mL- Right; 75 mL- Left);  Surgeon: Irene Limbo, MD;  Location: [place];  Service: Plastics;  Laterality: Bilateral;  . TONSILLECTOMY    . TOTAL ABDOMINAL HYSTERECTOMY  March 2006   [facility]   and Bilateral Salpingoophorectomy/  staging for Ovarian cancer/  an  . XI ROBOTIC ASSISTED LOWER ANTERIOR RESECTION N/A 04/01/2017   Procedure: XI ROBOTIC VS LAPAROSCOPIC COLOSTOMY TAKEDOWN WITH LYSIS OF ADHESIONS.;  Surgeon: Michael [name], MD;  [facility];  Service: General;  Laterality: N/A;  ERAS PATHWAY    Current Medications: Current Meds  Medication Sig  . acetaminophen (TYLENOL) 325 MG tablet Take 2 tablets (650 mg total) by mouth every 6 (six) hours as needed.  [name] Kitchen albuterol (PROVENTIL HFA) 108 (90 Base) MCG/ACT inhaler Inhale 1 puff into the lungs every 6 (six) hours as needed for wheezing or shortness of breath.  . anastrozole (ARIMIDEX) 1 MG tablet Take 1 tablet (1 mg total) by mouth daily.  . Biotin 5 MG TABS Take 5 mg by mouth daily.   . calcium citrate-vitamin D (CITRACAL+D) 315-200 MG-UNIT tablet Take 1 tablet by mouth daily.  . Cholecalciferol (VITAMIN D3) 10000 UNITS capsule Take 10,000 Units by mouth every Sunday.   . clobetasol  (OLUX) 0.05 % topical foam Apply topically 2 (two) times daily.  . diphenhydrAMINE (BENADRYL) 25 mg capsule Take 1 capsule (25 mg total) by mouth every 8 (eight) hours as needed for itching, allergies or sleep.  . ferrous sulfate 325 (65 FE) MG tablet Take 1 tablet (325 mg total) by mouth at bedtime.  [name] Kitchen JANUVIA 50 MG tablet Take 50  mg by mouth daily.   [name] Kitchen levothyroxine (SYNTHROID, LEVOTHROID) 150 MCG tablet Take 1 tablet (150 mcg total) by mouth daily before breakfast.  . loratadine (CLARITIN) 10 MG tablet Take 10 mg by mouth daily.   [name] Kitchen omega-3 acid ethyl esters (LOVAZA) 1 G capsule Take 1 g by mouth 2 (two) times daily.   Vladimir Faster Glycol-Propyl Glycol (SYSTANE OP) Place 1 drop into both eyes daily as needed (dry eyes).   . pregabalin (LYRICA) 50 MG capsule Take 1 capsule (50 mg total) by mouth 2 (two) times daily.  . Prenatal Vit-Fe Fumarate-FA (PRENATAL VITAMIN PO) Take 1 capsule by mouth daily. Takes prenatal because there are no dyes in it  . Probiotic Product (ALIGN PO) Take by mouth 2 (two) times daily.  . rifaximin (XIFAXAN) 200 MG tablet Take 200 mg by mouth 3 (three) times daily.  . rosuvastatin (CRESTOR) 10 MG tablet Take 10 mg by mouth every evening.   . Saccharomyces boulardii (FLORASTOR PO) Take 1 capsule by mouth daily.  . Semaglutide (RYBELSUS) 3 MG TABS Take 1 tablet by mouth daily.  . Tbo-Filgrastim (GRANIX) 480 MCG/0.8ML SOSY injection 480 mcg subcutaneous injection every 6 days life long     Allergies:   Penicillins, Cefaclor, Erythromycin, Tape, Trimethoprim, Ultram [tramadol], Cephalosporins, Fluconazole, Oxycodone, Pectin, Septra [sulfamethoxazole-trimethoprim], and Sulfa antibiotics   Social History   Socioeconomic History  . Marital status: Married    Spouse name: Jasmen Emrich  . Number of children: 1  . Years of education: Not on file  . Highest education level: Not on file  Occupational History  . 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 Smoking status: Never Smoker  . Smokeless tobacco: Never Used  Substance and Sexual Activity  . Alcohol use: Not Currently  . Drug use: No  . Sexual activity: Not on file  Other Topics Concern  . Not on file  Social History Narrative   Exercise-- has not gotten back into it since cancer came back   Social Determinants of Health   Financial Resource Strain:   . Difficulty of Paying Living Expenses:   Food Insecurity:   . Worried About Charity fundraiser in the Last Year:   . Arboriculturist in the Last Year:   Transportation Needs:   . Film/video editor (Medical):   [name] Kitchen Lack of Transportation (Non-Medical):   Physical Activity:   . Days of Exercise per Week:   . Minutes of Exercise per Session:   Stress:   . Feeling of Stress :   Social Connections:   . Frequency of Communication with Friends and Family:   . Frequency of Social Gatherings with Friends and Family:   . Attends Religious Services:   . Active Member of Clubs or Organizations:   . Attends Archivist Meetings:   [name] Kitchen Marital Status:      Family History: The patient's family history includes Brain cancer in her paternal grandfather; Breast cancer in her maternal aunt; Cancer (age of onset: 69) in her mother; Cancer (age of onset: 80) in her father; Diabetes in her brother, father, and sister; Heart disease in her brother and father; Hyperlipidemia in her father; Hypertension in her father and mother; Hypertension (age of onset: y-10) in her brother; Lymphoma in her paternal aunt; Obesity in her father; Ovarian cancer in an other family member.  ROS:   Please see the history of present  illness.    Status post complicated ovarian and endometrial cancer surgery complicated by ureteral and colonic fistula high.  She is on chronic chemotherapy for suppression of ovarian and uterine cancer growth.  Zarxio has been changed to Granix.  She felt more short of breath on Zarxio.  She has chronic  kidney disease as a complication of abdominal surgery.  Based upon creatinine level she is stage IV.  She has urostomy and colostomy.  All other systems reviewed and are negative.  EKGs/Labs/Other Studies Reviewed:    The following studies were reviewed today  Non cardiac chest CT 2018: Cardiovascular: No acute findings. Aortic atherosclerosis  EKG:  EKG normal sinus rhythm, left axis deviation, first-degree AV block, incomplete right bundle branch block.  Compared to 2019 tracing, no change has occurred.  Recent Labs: 01/22/2019: Magnesium 1.3 10/02/2019: ALT 26; BUN 38; Creatinine, Ser 2.27; Hemoglobin 12.4; Platelets 214; Potassium 3.4; Sodium 139  Recent Lipid Panel    Component Value Date/Time   CHOL 109 08/28/2015 1617   TRIG 89 04/18/2017 0427   HDL 43.80 08/28/2015 1617   CHOLHDL 2 08/28/2015 1617   VLDL 19.8 08/28/2015 1617   LDLCALC 45 08/28/2015 1617    Physical Exam:    VS:  BP 108/68   Pulse 72   Ht 5' 2"  (1.575 m)   Wt 197 lb (89.4 kg)   SpO2 99%   BMI 36.03 kg/m     Wt Readings from Last 3 Encounters:  11/14/19 197 lb (89.4 kg)  11/08/19 197 lb (89.4 kg)  10/02/19 196 lb 6.4 oz (89.1 kg)     GEN: Moderate to morbid obesity. No acute distress HEENT: Normal NECK: No JVD. LYMPHATICS: No lymphadenopathy CARDIAC:  RRR without murmur, gallop, or edema. VASCULAR:  Normal Pulses. No bruits. RESPIRATORY:  Clear to auscultation without rales, wheezing or rhonchi  ABDOMEN: Soft, non-tender, non-distended, No pulsatile mass, MUSCULOSKELETAL: No deformity  SKIN: Warm and dry NEUROLOGIC:  Alert and oriented x 3 PSYCHIATRIC:  Normal affect   ASSESSMENT:    1. DOE (dyspnea on exertion)   2. Benign essential HTN   3. Hyperlipidemia, unspecified hyperlipidemia type   4. Stage 3 chronic kidney disease, unspecified whether stage 3a or 3b CKD   5. Class 2 severe obesity with serious comorbidity and body mass index (BMI) of 36.0 to 36.9 in adult, unspecified obesity  type ([place])   6. Type 2 diabetes mellitus with stage 4 chronic kidney disease, without long-term current use of insulin (HCC)   7. Cancer of corpus uteri, except isthmus ([place])   8. Educated about COVID-19 virus infection   9. SOB (shortness of breath)   10. Palpitations    PLAN:    In order of problems listed above:  1. Dyspnea is of uncertain etiology.  They could be a component of deconditioning.  Rule out systolic/diastolic heart dysfunction.  BNP and 2D Doppler echocardiogram will be done. 2. Blood pressure control is outstanding and may be relatively low.  This could be the source of orthostatic dizziness.  Orthostasis was not evaluated today.  3.  She does have aortic atherosclerosis noted on 9 coronary chest CT in 2018.  Target LDL should be less than 70. 4. 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 5. Not specifically discussed 6. Type 2 diabetes with A1c of unknown quantity.  The last that I have available is from 2019 when it was 7.9.  She tells  me she has lost over 100 pounds. 7. Not discussed 8. She has received the COVID-19 vaccine, she wears a mask, and is socially distanced. 9. BNP and 2D Doppler echocardiogram will be done. 10. A 30-day monitor will be done to exclude any significant arrhythmia related to cardio oncology concerns.   Medication Adjustments/Labs and Tests Ordered: Current medicines are reviewed at length with the patient today.  Concerns regarding medicines are outlined above.  Orders Placed This Encounter  Procedures  . Pro b natriuretic peptide  . Cardiac event monitor  . ECHOCARDIOGRAM COMPLETE   No orders of the defined types were placed in this encounter.   Patient Instructions  Medication Instructions:  Your physician recommends that you continue on your current medications as directed. Please refer to the Current Medication list given to you today.  *If you need a refill on  your cardiac medications before your next appointment, please call your pharmacy*   Lab Work: Pro BNP today  If you have labs (blood work) drawn today and your tests are completely normal, you will receive your results only by: [name] Kitchen MyChart Message (if you have MyChart) OR . A paper copy in the mail If you have any lab test that is abnormal or we need to change your treatment, we will call you to review the results.   Testing/Procedures: Your physician has requested that you have an echocardiogram. Echocardiography is a painless test that uses sound waves to create images of your heart. It provides your doctor with information about the size and shape of your heart and how well your heart's chambers and valves are working. This procedure takes approximately one hour. There are no restrictions for this procedure.  Your physician has recommended that you wear an event monitor. Event monitors are medical devices that record the heart's electrical activity. Doctors most often [place] these monitors to diagnose arrhythmias. Arrhythmias are problems with the speed or rhythm of the heartbeat. The monitor is a small, portable device. You can wear one while you do your normal daily activities. This is usually used to diagnose what is causing palpitations/syncope (passing out).    Follow-Up: At [facility], you and your health needs are our priority.  As part of our continuing mission to provide you with exceptional heart care, we have created designated Provider Care Teams.  These Care Teams include your primary Cardiologist (physician) and Advanced Practice Providers (APPs -  Physician Assistants and Nurse Practitioners) who all work together to provide you with the care you need, when you need it.  We recommend signing up for the patient portal called "MyChart".  Sign up information is provided on this After Visit Summary.  MyChart is used to connect with patients for Virtual Visits (Telemedicine).   Patients are able to view lab/test results, encounter notes, upcoming appointments, etc.  Non-urgent messages can be sent to your provider as well.   To learn more about what you can do with MyChart, go to [email or web address].    Your next appointment:   As needed  The format for your next appointment:   In Person  Provider:   You may see Dr. Daneen Schick or one of the following Advanced Practice Providers on your designated Care Team:    Truitt Merle, NP  Cecilie Kicks, NP  Kathyrn Drown, NP    Other Instructions      Signed, Sinclair Grooms, MD  11/14/2019 5:25 PM    [place]

## 2019-11-14 ENCOUNTER — Encounter: Payer: Self-pay | Admitting: Interventional Cardiology

## 2019-11-14 ENCOUNTER — Ambulatory Visit (INDEPENDENT_AMBULATORY_CARE_PROVIDER_SITE_OTHER): Payer: BC Managed Care – PPO | Admitting: Interventional Cardiology

## 2019-11-14 ENCOUNTER — Other Ambulatory Visit: Payer: Self-pay

## 2019-11-14 VITALS — BP 108/68 | HR 72 | Ht 62.0 in | Wt 197.0 lb

## 2019-11-14 DIAGNOSIS — R0600 Dyspnea, unspecified: Secondary | ICD-10-CM

## 2019-11-14 DIAGNOSIS — N183 Chronic kidney disease, stage 3 unspecified: Secondary | ICD-10-CM | POA: Diagnosis not present

## 2019-11-14 DIAGNOSIS — E785 Hyperlipidemia, unspecified: Secondary | ICD-10-CM | POA: Diagnosis not present

## 2019-11-14 DIAGNOSIS — N184 Chronic kidney disease, stage 4 (severe): Secondary | ICD-10-CM

## 2019-11-14 DIAGNOSIS — Z7189 Other specified counseling: Secondary | ICD-10-CM

## 2019-11-14 DIAGNOSIS — I10 Essential (primary) hypertension: Secondary | ICD-10-CM

## 2019-11-14 DIAGNOSIS — C549 Malignant neoplasm of corpus uteri, unspecified: Secondary | ICD-10-CM

## 2019-11-14 DIAGNOSIS — E1122 Type 2 diabetes mellitus with diabetic chronic kidney disease: Secondary | ICD-10-CM

## 2019-11-14 DIAGNOSIS — R0602 Shortness of breath: Secondary | ICD-10-CM | POA: Diagnosis not present

## 2019-11-14 DIAGNOSIS — R0609 Other forms of dyspnea: Secondary | ICD-10-CM

## 2019-11-14 DIAGNOSIS — R002 Palpitations: Secondary | ICD-10-CM

## 2019-11-14 DIAGNOSIS — Z6836 Body mass index (BMI) 36.0-36.9, adult: Secondary | ICD-10-CM

## 2019-11-14 NOTE — Patient Instructions (Signed)
Medication Instructions:  Your physician recommends that you continue on your current medications as directed. Please refer to the Current Medication list given to you today.  *If you need a refill on your cardiac medications before your next appointment, please call your pharmacy*   Lab Work: Pro BNP today  If you have labs (blood work) drawn today and your tests are completely normal, you will receive your results only by: [name] Kitchen MyChart Message (if you have MyChart) OR . A paper copy in the mail If you have any lab test that is abnormal or we need to change your treatment, we will call you to review the results.   Testing/Procedures: Your physician has requested that you have an echocardiogram. Echocardiography is a painless test that uses sound waves to create images of your heart. It provides your doctor with information about the size and shape of your heart and how well your heart's chambers and valves are working. This procedure takes approximately one hour. There are no restrictions for this procedure.  Your physician has recommended that you wear an event monitor. Event monitors are medical devices that record the heart's electrical activity. Doctors most often [place] these monitors to diagnose arrhythmias. Arrhythmias are problems with the speed or rhythm of the heartbeat. The monitor is a small, portable device. You can wear one while you do your normal daily activities. This is usually used to diagnose what is causing palpitations/syncope (passing out).    Follow-Up: At [facility], you and your health needs are our priority.  As part of our continuing mission to provide you with exceptional heart care, we have created designated Provider Care Teams.  These Care Teams include your primary Cardiologist (physician) and Advanced Practice Providers (APPs -  Physician Assistants and Nurse Practitioners) who all work together to provide you with the care you need, when you need it.  We  recommend signing up for the patient portal called "MyChart".  Sign up information is provided on this After Visit Summary.  MyChart is used to connect with patients for Virtual Visits (Telemedicine).  Patients are able to view lab/test results, encounter notes, upcoming appointments, etc.  Non-urgent messages can be sent to your provider as well.   To learn more about what you can do with MyChart, go to [email or web address].    Your next appointment:   As needed  The format for your next appointment:   In Person  Provider:   You may see Dr. Daneen Schick or one of the following Advanced Practice Providers on your designated Care Team:    Truitt Merle, NP  Cecilie Kicks, NP  Kathyrn Drown, NP    Other Instructions

## 2019-11-15 LAB — PRO B NATRIURETIC PEPTIDE: NT-Pro BNP: 54 pg/mL (ref 0–301)

## 2019-11-15 NOTE — Addendum Note (Signed)
Addended by: [name] on: 11/15/2019 01:24 PM   Modules accepted: Orders

## 2019-11-20 ENCOUNTER — Ambulatory Visit (INDEPENDENT_AMBULATORY_CARE_PROVIDER_SITE_OTHER): Payer: BC Managed Care – PPO

## 2019-11-20 ENCOUNTER — Encounter: Payer: Self-pay | Admitting: Interventional Cardiology

## 2019-11-20 DIAGNOSIS — R002 Palpitations: Secondary | ICD-10-CM | POA: Diagnosis not present

## 2019-11-21 DIAGNOSIS — R197 Diarrhea, unspecified: Secondary | ICD-10-CM | POA: Diagnosis not present

## 2019-11-22 DIAGNOSIS — L89319 Pressure ulcer of right buttock, unspecified stage: Secondary | ICD-10-CM | POA: Diagnosis not present

## 2019-11-22 DIAGNOSIS — Z933 Colostomy status: Secondary | ICD-10-CM | POA: Diagnosis not present

## 2019-11-22 DIAGNOSIS — Z4801 Encounter for change or removal of surgical wound dressing: Secondary | ICD-10-CM | POA: Diagnosis not present

## 2019-11-22 DIAGNOSIS — Z936 Other artificial openings of urinary tract status: Secondary | ICD-10-CM | POA: Diagnosis not present

## 2019-11-26 ENCOUNTER — Telehealth: Payer: Self-pay | Admitting: *Deleted

## 2019-11-26 ENCOUNTER — Other Ambulatory Visit: Payer: Self-pay

## 2019-11-26 ENCOUNTER — Ambulatory Visit (HOSPITAL_COMMUNITY): Payer: BC Managed Care – PPO | Attending: Internal Medicine

## 2019-11-26 DIAGNOSIS — R0602 Shortness of breath: Secondary | ICD-10-CM | POA: Diagnosis not present

## 2019-11-26 MED ORDER — PERFLUTREN LIPID MICROSPHERE
1.0000 mL | INTRAVENOUS | Status: AC | PRN
  Administered 2019-11-26: 2 mL via INTRAVENOUS

## 2019-11-26 NOTE — Telephone Encounter (Signed)
[employer] faxed over an auto-triggered event on this pt for 11/23/19 at 0633 CST, showing that the pt was in afib RVR sustained with rate 140 bpm.  This was an auto-triggered event. Dr. Tamala Julian ordered event monitor on this pt for the following reason:  PLAN:    In order of problems listed above:  1. Dyspnea is of uncertain etiology.  They could be a component of deconditioning.  Rule out systolic/diastolic heart dysfunction.  BNP and 2D Doppler echocardiogram will be done. 2. Blood pressure control is outstanding and may be relatively low.  This could be the source of orthostatic dizziness.  Orthostasis was not evaluated today.  3.  She does have aortic atherosclerosis noted on 9 coronary chest CT in 2018.  Target LDL should be less than 70. 4. 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 5. Not specifically discussed 6. Type 2 diabetes with A1c of unknown quantity.  The last that I have available is from 2019 when it was 7.9.  She tells me she has lost over 100 pounds. 7. Not discussed 8. She has received the COVID-19 vaccine, she wears a mask, and is socially distanced. 9. BNP and 2D Doppler echocardiogram will be done. 10. A 30-day monitor will be done to exclude any significant arrhythmia related to cardio oncology concerns.   Called the pt to ask how her symptoms were at this recorded event, and her husband was speaking for her, for he states she really can't talk at the moment, for she just found out her Father passed away earlier this morning, and she has been through a lot today. Husband did state at the time of this recorded event, he was with the pt, and they were both sleeping comfortably, and she did not wake up to voice complaints. Husband states she is asymptomatic at this time as well, just dealing with a lot of stress at this moment, being her Father just passed away. Informed the pts Husband Clair Gulling (who is on DPR)  that I will go and show this monitor recording to our DOD Dr. Johnsie Cancel to review and advise on, being Dr. Tamala Julian is out of the office the remainder of the day.  Informed the pts Husband that I will follow-up with him shortly thereafter, if any recommendations are given. Also informed the pts Husband that I will also route this message to Dr. Tamala Julian and his covering RN for their review and further follow-up. Husband verbalized understanding and agrees with this plan.  Off note:  Pt is scheduled for an echo today, as ordered by Dr. Tamala Julian, and Husband states they plan to attend.

## 2019-11-26 NOTE — Telephone Encounter (Signed)
Showed the DOD Dr. Johnsie Cancel the pts monitor recording.  Per Dr. Johnsie Cancel, his interpretation of her monitor showed that he could not rule out short run of aflutter, and this monitor should be given to Dr. Tamala Julian and his RN for further review, recommendation on any changes to be made, and follow-up with the pt.    Spoke with the pt and informed her that we will continue to monitor, and no changes were made by our DOD at this time.  Informed the pt that we will give her monitor to Dr. Thompson Caul primary nurse to further review with him, and follow-up with her thereafter, if further changes to her medical regimen need to be made. Advised the pt to come to her scheduled echo today, if possible.  Pt verbalized understanding and agrees with this plan. Pt states she will be attending her echo, as scheduled for today.

## 2019-11-27 NOTE — Telephone Encounter (Signed)
Findings noted.  No recommendations currently.  We will act upon overall findings from the monitor.

## 2019-11-29 ENCOUNTER — Ambulatory Visit (INDEPENDENT_AMBULATORY_CARE_PROVIDER_SITE_OTHER): Payer: BC Managed Care – PPO | Admitting: Family Medicine

## 2019-11-29 ENCOUNTER — Encounter (INDEPENDENT_AMBULATORY_CARE_PROVIDER_SITE_OTHER): Payer: Self-pay | Admitting: Family Medicine

## 2019-11-29 ENCOUNTER — Other Ambulatory Visit: Payer: Self-pay

## 2019-11-29 VITALS — BP 115/77 | HR 81 | Temp 98.0°F | Ht 62.0 in | Wt 188.0 lb

## 2019-11-29 DIAGNOSIS — E119 Type 2 diabetes mellitus without complications: Secondary | ICD-10-CM | POA: Diagnosis not present

## 2019-11-29 DIAGNOSIS — E669 Obesity, unspecified: Secondary | ICD-10-CM

## 2019-11-29 DIAGNOSIS — Z6834 Body mass index (BMI) 34.0-34.9, adult: Secondary | ICD-10-CM

## 2019-11-29 MED ORDER — RYBELSUS 3 MG PO TABS: 1.0000 | ORAL_TABLET | Freq: Every day | ORAL | 0 refills | Status: DC

## 2019-11-29 NOTE — Progress Notes (Signed)
Chief Complaint:   OBESITY [name] is here to discuss her progress with her obesity treatment plan along with follow-up of her obesity related diagnoses. [name] is on the Category 2 Plan and states she is following her eating plan approximately 99% of the time. [name] states she is walking for 10-15 minutes 4-5 times per week.  Today's visit was #: 30 Starting weight: 208 lbs Starting date: 04/05/2018 Today's weight: 188 lbs Today's date: 11/29/2019 Total lbs lost to date: 20 Total lbs lost since last in-office visit: 9  Interim History: [name] continues to do very well with her Category 2 plan. Her hunger is controlled and she is journaling most days. Her father died this week and she is grieving but still eating.  Subjective:   1. Type 2 diabetes mellitus without complication, without long-term current use of insulin (HCC) [name]'s fasting BGs mostly range between 71 and 120. She feels it when her BGs is low and eats to improve her BGs. She is tolerating her medications well.  Assessment/Plan:   1. Type 2 diabetes mellitus without complication, without long-term current use of insulin (HCC) Good blood sugar control is important to decrease the likelihood of diabetic complications such as nephropathy, neuropathy, limb loss, blindness, coronary artery disease, and death. Intensive lifestyle modification including diet, exercise and weight loss are the first line of treatment for diabetes. We will refill Rybelsus for 90 days with no refills, and [name] will continue Januvia as is.  - Semaglutide (RYBELSUS) 3 MG TABS; Take 1 tablet by mouth daily.  Dispense: 90 tablet; Refill: 0  2. Class 1 obesity with serious comorbidity and body mass index (BMI) of 34.0 to 34.9 in adult, unspecified obesity type [name] is currently in the action stage of change. As such, her goal is to continue with weight loss efforts. She has agreed to the Category 2 Plan.   Exercise goals: As is.  Behavioral  modification strategies: no skipping meals and celebration eating strategies.  [name] has agreed to follow-up with our clinic in 3 to 4 weeks. She was informed of the importance of frequent follow-up visits to maximize her success with intensive lifestyle modifications for her multiple health conditions.   Objective:   Blood pressure 115/77, pulse 81, temperature 98 F (36.7 C), temperature source Oral, height 5\' 2"  (1.575 m), weight 188 lb (85.3 kg), SpO2 99 %. Body mass index is 34.39 kg/m.  General: Cooperative, alert, well developed, in no acute distress. HEENT: Conjunctivae and lids unremarkable. Cardiovascular: Regular rhythm.  Lungs: Normal work of breathing. Neurologic: No focal deficits.   Lab Results  Component Value Date   CREATININE 2.27 (H) 10/02/2019   BUN 38 (H) 10/02/2019   NA 139 10/02/2019   K 3.4 (L) 10/02/2019   CL 111 10/02/2019   CO2 19 (L) 10/02/2019   Lab Results  Component Value Date   ALT 26 10/02/2019   AST 14 (L) 10/02/2019   ALKPHOS 174 (H) 10/02/2019   BILITOT 0.4 10/02/2019   Lab Results  Component Value Date   HGBA1C 7.9 (H) 09/01/2017   HGBA1C 5.3 04/01/2017   HGBA1C 5.8 (H) 09/05/2015   HGBA1C 6.8 (H) 11/21/2014   HGBA1C 6.3 (H) 11/07/2014   No results found for: INSULIN Lab Results  Component Value Date   TSH 2.408 08/30/2017   Lab Results  Component Value Date   CHOL 109 08/28/2015   HDL 43.80 08/28/2015   LDLCALC 45 08/28/2015   TRIG 89 04/18/2017  CHOLHDL 2 08/28/2015   Lab Results  Component Value Date   WBC 3.3 (L) 10/02/2019   HGB 12.4 10/02/2019   HCT 39.0 10/02/2019   MCV 97.7 10/02/2019   PLT 214 10/02/2019   Lab Results  Component Value Date   IRON 7 (L) 08/31/2017   TIBC 164 (L) 08/31/2017   FERRITIN 27 11/29/2014   Attestation Statements:   Reviewed by clinician on day of visit: allergies, medications, problem list, medical history, surgical history, family history, social history, and previous  encounter notes.  Time spent on visit including pre-visit chart review and post-visit care and charting was 30 minutes.    I, Trixie Dredge, am acting as transcriptionist for Dennard Nip, MD.  I have reviewed the above documentation for accuracy and completeness, and I agree with the above. -  Dennard Nip, MD

## 2019-12-03 ENCOUNTER — Telehealth: Payer: Self-pay | Admitting: Interventional Cardiology

## 2019-12-03 NOTE — Telephone Encounter (Signed)
Patient returning [name]'s call in regards to echo results. You can call home phone if she does not answer her cell phone.

## 2019-12-03 NOTE — Telephone Encounter (Signed)
[employer]   Result Notes   Nuala Alpha, LPN  03/02/8298 37:16 AM EDT    The patient has been notified of the result and verbalized understanding. All questions (if any) were answered. Nuala Alpha, LPN 04/07/7892 81:01 AM    Will route a copy of this result to the pts PCP.    Nuala Alpha, LPN  02/04/1024 85:27 AM EDT    Left message for the pt to call back for results.   Belva Crome, MD  12/01/2019 7:19 PM EDT    Let the patient know heart function is normal. Overall, no significant abn. Mild LVH related to weight. A copy will be sent to Carollee Herter, Alferd Apa, DO

## 2019-12-12 DIAGNOSIS — E039 Hypothyroidism, unspecified: Secondary | ICD-10-CM | POA: Diagnosis not present

## 2019-12-12 DIAGNOSIS — E119 Type 2 diabetes mellitus without complications: Secondary | ICD-10-CM | POA: Diagnosis not present

## 2019-12-12 DIAGNOSIS — E559 Vitamin D deficiency, unspecified: Secondary | ICD-10-CM | POA: Diagnosis not present

## 2019-12-12 DIAGNOSIS — E782 Mixed hyperlipidemia: Secondary | ICD-10-CM | POA: Diagnosis not present

## 2019-12-14 DIAGNOSIS — E782 Mixed hyperlipidemia: Secondary | ICD-10-CM | POA: Diagnosis not present

## 2019-12-14 DIAGNOSIS — I129 Hypertensive chronic kidney disease with stage 1 through stage 4 chronic kidney disease, or unspecified chronic kidney disease: Secondary | ICD-10-CM | POA: Diagnosis not present

## 2019-12-14 DIAGNOSIS — N184 Chronic kidney disease, stage 4 (severe): Secondary | ICD-10-CM | POA: Diagnosis not present

## 2019-12-14 DIAGNOSIS — E1122 Type 2 diabetes mellitus with diabetic chronic kidney disease: Secondary | ICD-10-CM | POA: Diagnosis not present

## 2019-12-17 DIAGNOSIS — L89319 Pressure ulcer of right buttock, unspecified stage: Secondary | ICD-10-CM | POA: Diagnosis not present

## 2019-12-17 DIAGNOSIS — Z933 Colostomy status: Secondary | ICD-10-CM | POA: Diagnosis not present

## 2019-12-24 ENCOUNTER — Encounter (INDEPENDENT_AMBULATORY_CARE_PROVIDER_SITE_OTHER): Payer: Self-pay | Admitting: Family Medicine

## 2019-12-24 ENCOUNTER — Ambulatory Visit (INDEPENDENT_AMBULATORY_CARE_PROVIDER_SITE_OTHER): Payer: BC Managed Care – PPO | Admitting: Family Medicine

## 2019-12-24 ENCOUNTER — Other Ambulatory Visit: Payer: Self-pay

## 2019-12-24 VITALS — BP 92/67 | HR 76 | Temp 98.0°F | Ht 62.0 in | Wt 193.0 lb

## 2019-12-24 DIAGNOSIS — E1169 Type 2 diabetes mellitus with other specified complication: Secondary | ICD-10-CM | POA: Diagnosis not present

## 2019-12-24 DIAGNOSIS — Z6835 Body mass index (BMI) 35.0-35.9, adult: Secondary | ICD-10-CM

## 2019-12-24 IMAGING — CT CT ABDOMEN AND PELVIS WITH CONTRAST
2 of 5 series · 16 of 46 positions shown, 18 images · IV contrast (APPLIED)
Comparison: Noncontrast CT on 10/04/2018 and 02/01/2018

CLINICAL DATA: Lower abdominal and pelvic pain. Interstitial
cystitis. Recurrent endometrial carcinoma and left breast carcinoma.

EXAM:
CT ABDOMEN AND PELVIS WITH CONTRAST
TECHNIQUE: Multidetector CT imaging of the abdomen and pelvis was performed
using the standard protocol following bolus administration of
intravenous contrast.
CONTRAST:  75mL OMNIPAQUE IOHEXOL 300 MG/ML  SOLN

[Series 2: axial st · axial · 0.98mm/px · z∈[-748,-393]mm · 13 of 83 slices shown, 15 images]
[im 6/83  soft-tissue]
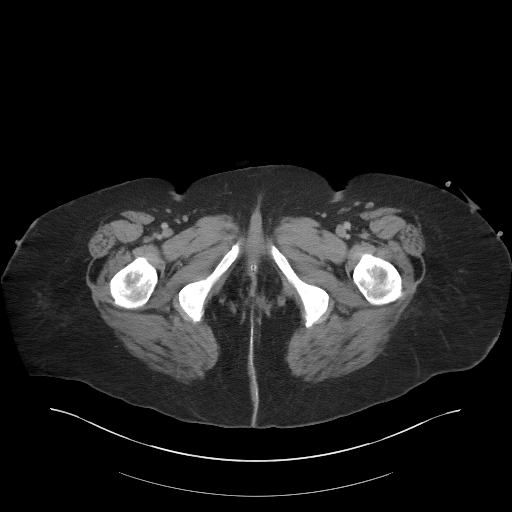
[im 6/83  bone]
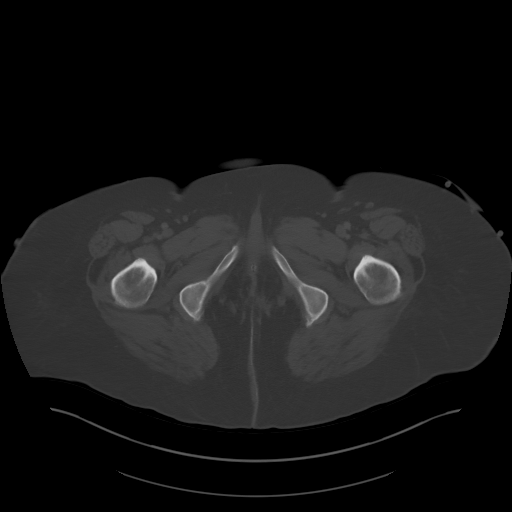
[im 11/83  soft-tissue]
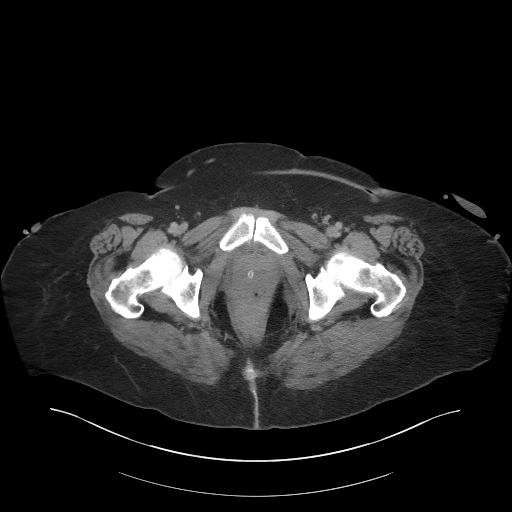
[im 17/83  soft-tissue]
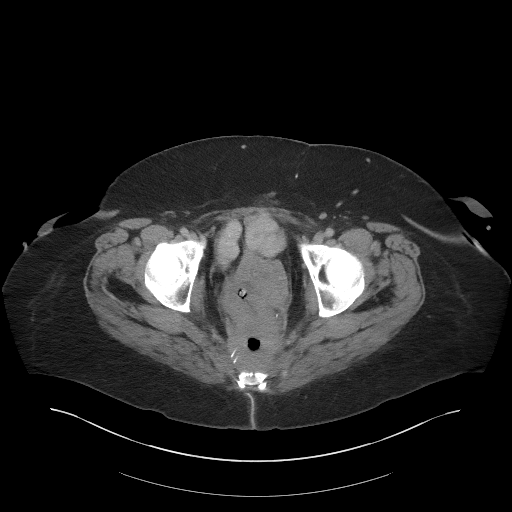
[im 22/83  soft-tissue]
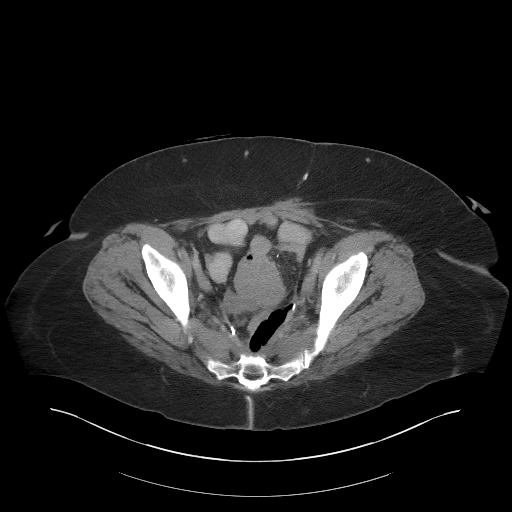
[im 28/83  soft-tissue]
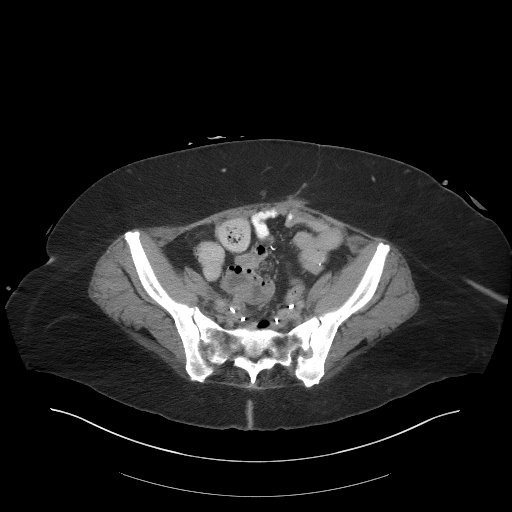
[im 33/83  soft-tissue]
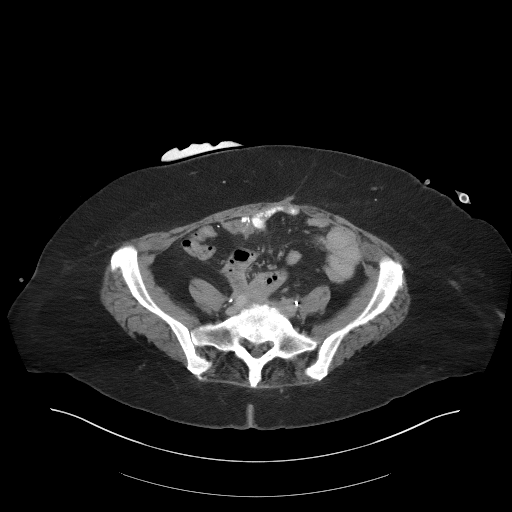
[im 44/83  soft-tissue]
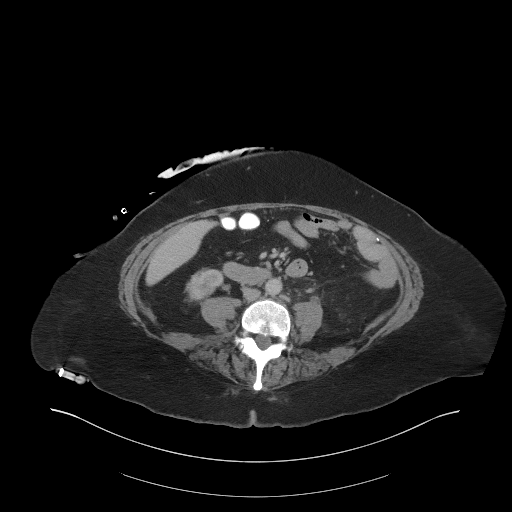
[im 50/83  soft-tissue]
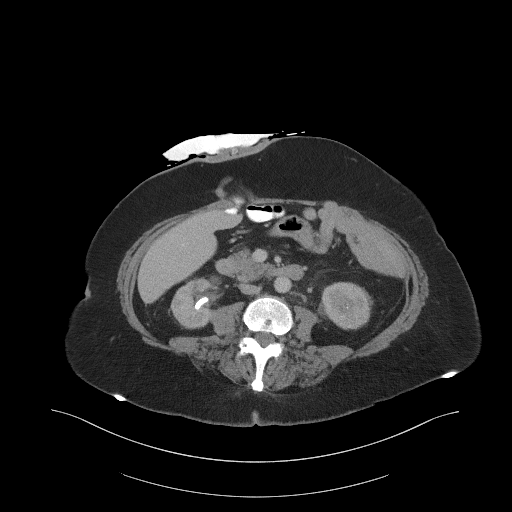
[im 55/83  soft-tissue]
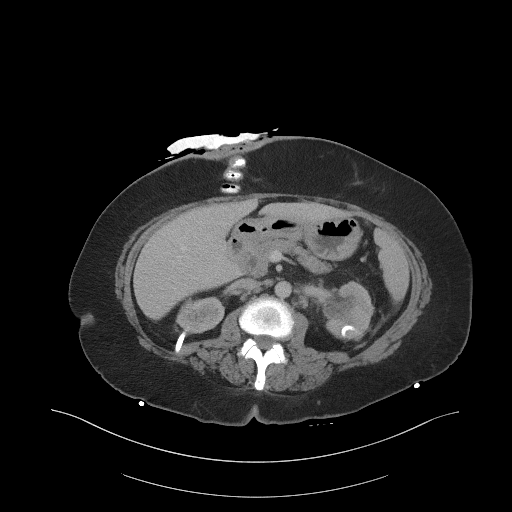
[im 55/83  bone]
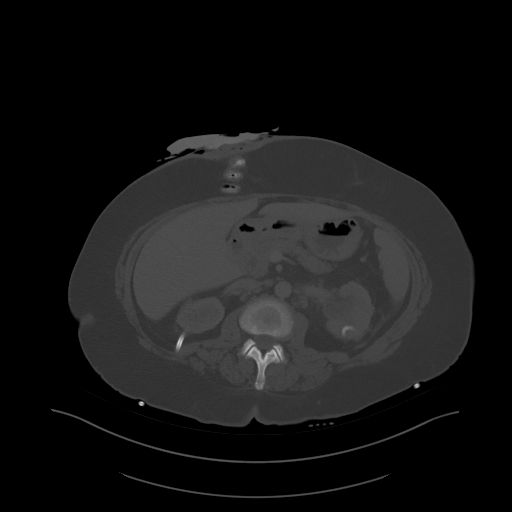
[im 61/83  soft-tissue]
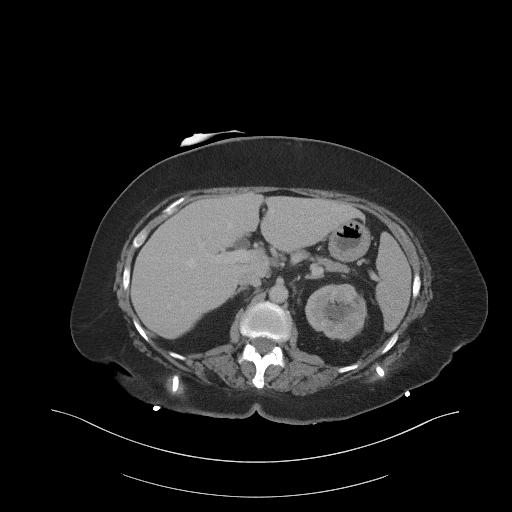
[im 66/83  soft-tissue]
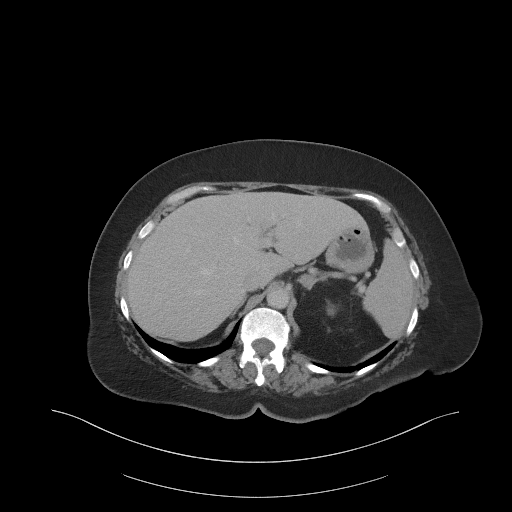
[im 72/83  soft-tissue]
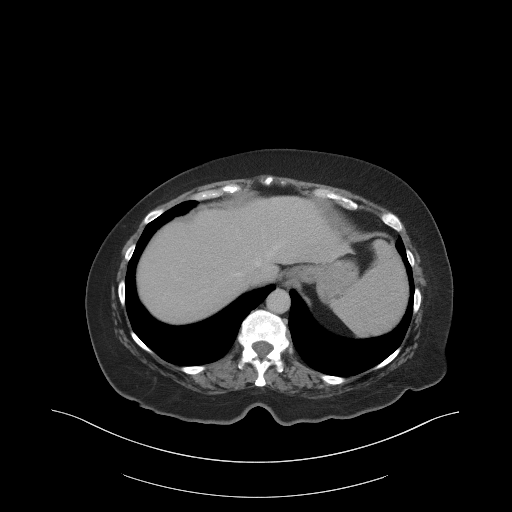
[im 77/83  soft-tissue]
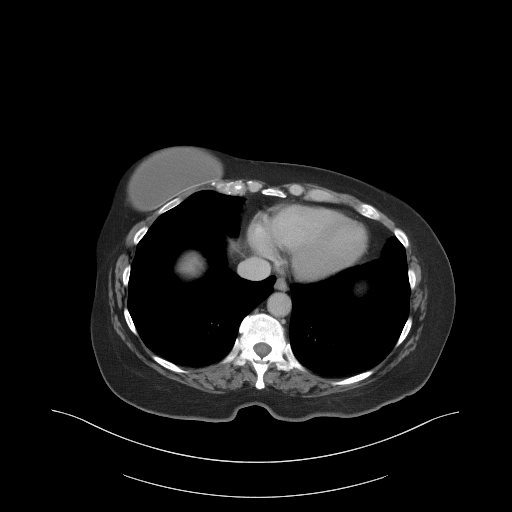

[Series 4: coronal st · coronal · 0.81mm/px · 3 of 95 slices shown]
[im 32/95  soft-tissue]
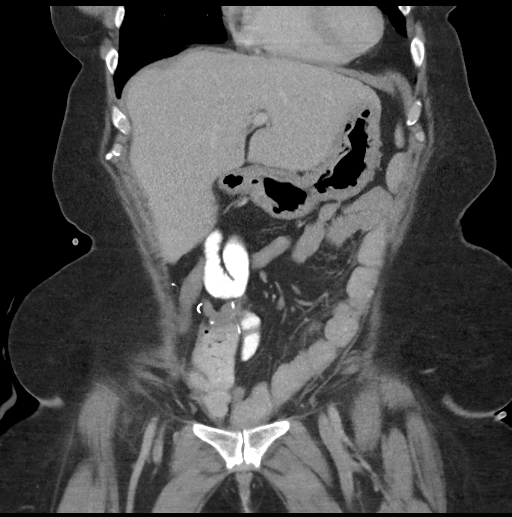
[im 42/95  soft-tissue]
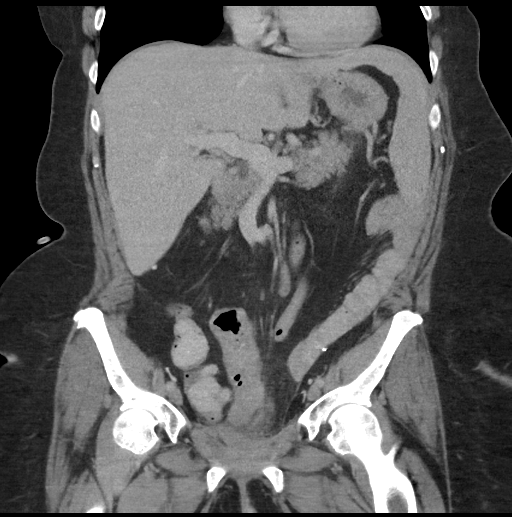
[im 53/95  soft-tissue]
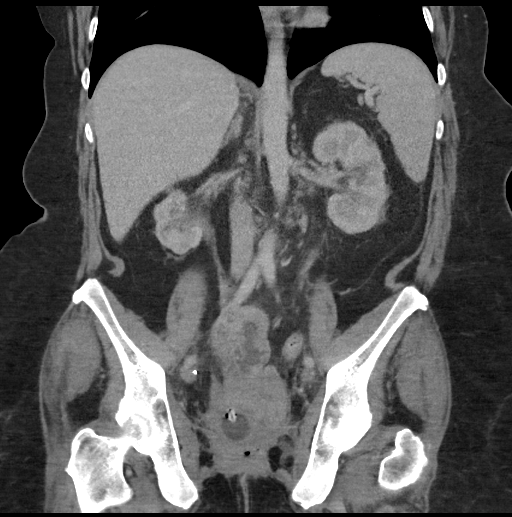

[16 of 46 positions shown; findings below may reference images not displayed]

FINDINGS: Lower Chest: No acute findings.

Hepatobiliary: No hepatic masses identified. Prior cholecystectomy.
No evidence of biliary obstruction.

Pancreas:  No mass or inflammatory changes.

Spleen: Within normal limits in size and appearance.

Adrenals/Urinary Tract: Stable 1.7 cm left adrenal mass, consistent
with benign adenoma. No evidence of renal masses. Right percutaneous
nephrostomy tube is seen in appropriate position. The left
percutaneous nephrostomy tube is in abnormal position located
posterior to the renal collecting system in the midpole of the left
kidney. Mild left hydronephrosis is increased since previous study.
Foley catheter is seen within the urinary bladder which is nearly
empty.

Stomach/Bowel: Right abdominal ileostomy again seen. Stable post
treatment changes are seen in the perirectal and presacral regions.
No evidence of obstruction, inflammatory process or abnormal fluid
collections.

Vascular/Lymphatic: No pathologically enlarged lymph nodes. No
abdominal aortic aneurysm.

Reproductive: Prior hysterectomy. No pelvic mass or abnormal free
fluid identified.

Other:  None.

Musculoskeletal:  No suspicious bone lesions identified.
IMPRESSION: 1. Increased mild left hydronephrosis, with left percutaneous
nephrostomy tube in abnormal position located posterior to the renal
collecting system.
2. Stable post treatment changes in pelvis. No evidence of recurrent
or metastatic carcinoma within the abdomen or pelvis.

## 2019-12-24 NOTE — Progress Notes (Signed)
Chief Complaint:   OBESITY [name] is here to discuss her progress with her obesity treatment plan along with follow-up of her obesity related diagnoses. [name] is on the Category 2 Plan and states she is following her eating plan approximately 30% of the time. [name] states she is doing more movement and is being more mindful of her steps.  Today's visit was #: 59 Starting weight: 208 lbs Starting date: 04/05/2018 Today's weight: 193 lbs Today's date: 12/24/2019 Total lbs lost to date: 15 Total lbs lost since last in-office visit: 0  Interim History: [name] is retaining fluid today. She has had to eat out more and has tried to make better choices, but her sodium is likely to have increased. She denies shortness of breath.  Subjective:   1. Type 2 diabetes mellitus with other specified complication, without long-term current use of insulin (HCC) [name] states her last A1c was 6.0. She saw her primary care physician who rightly stopped her Januvia. She is tolerating Rybelsus well with no nausea, vomiting, or hypoglycemia.  Assessment/Plan:   1. Type 2 diabetes mellitus with other specified complication, without long-term current use of insulin (HCC) Good blood sugar control is important to decrease the likelihood of diabetic complications such as nephropathy, neuropathy, limb loss, blindness, coronary artery disease, and death. Intensive lifestyle modification including diet, exercise and weight loss are the first line of treatment for diabetes. [name] will continue diet and exercise, and will continue Rybelsus; may increase dose at her next visit.  2. Class 2 severe obesity with serious comorbidity and body mass index (BMI) of 35.0 to 35.9 in adult, unspecified obesity type (HCC) [name] is currently in the action stage of change. As such, her goal is to continue with weight loss efforts. She has agreed to keeping a food journal and adhering to recommended goals of 1100-1300 calories and 75+  grams of protein daily.   Exercise goals: As is.  Behavioral modification strategies: decreasing sodium intake and decreasing eating out.  [name] has agreed to follow-up with our clinic in 4 weeks. She was informed of the importance of frequent follow-up visits to maximize her success with intensive lifestyle modifications for her multiple health conditions.   Objective:   Blood pressure 92/67, pulse 76, temperature 98 F (36.7 C), temperature source Oral, height 5\' 2"  (1.575 m), weight 193 lb (87.5 kg), SpO2 97 %. Body mass index is 35.3 kg/m.  General: Cooperative, alert, well developed, in no acute distress. HEENT: Conjunctivae and lids unremarkable. Cardiovascular: Regular rhythm.  Lungs: Normal work of breathing. Neurologic: No focal deficits.   Lab Results  Component Value Date   CREATININE 2.27 (H) 10/02/2019   BUN 38 (H) 10/02/2019   NA 139 10/02/2019   K 3.4 (L) 10/02/2019   CL 111 10/02/2019   CO2 19 (L) 10/02/2019   Lab Results  Component Value Date   ALT 26 10/02/2019   AST 14 (L) 10/02/2019   ALKPHOS 174 (H) 10/02/2019   BILITOT 0.4 10/02/2019   Lab Results  Component Value Date   HGBA1C 7.9 (H) 09/01/2017   HGBA1C 5.3 04/01/2017   HGBA1C 5.8 (H) 09/05/2015   HGBA1C 6.8 (H) 11/21/2014   HGBA1C 6.3 (H) 11/07/2014   No results found for: INSULIN Lab Results  Component Value Date   TSH 2.408 08/30/2017   Lab Results  Component Value Date   CHOL 109 08/28/2015   HDL 43.80 08/28/2015   LDLCALC 45 08/28/2015   TRIG 89 04/18/2017  CHOLHDL 2 08/28/2015   Lab Results  Component Value Date   WBC 3.3 (L) 10/02/2019   HGB 12.4 10/02/2019   HCT 39.0 10/02/2019   MCV 97.7 10/02/2019   PLT 214 10/02/2019   Lab Results  Component Value Date   IRON 7 (L) 08/31/2017   TIBC 164 (L) 08/31/2017   FERRITIN 27 11/29/2014   Attestation Statements:   Reviewed by clinician on day of visit: allergies, medications, problem list, medical history, surgical  history, family history, social history, and previous encounter notes.  Time spent on visit including pre-visit chart review and post-visit care and charting was 32 minutes.    I, Trixie Dredge, am acting as transcriptionist for Dennard Nip, MD.  I have reviewed the above documentation for accuracy and completeness, and I agree with the above. -  Dennard Nip, MD

## 2019-12-27 ENCOUNTER — Other Ambulatory Visit: Payer: Self-pay | Admitting: Hematology and Oncology

## 2019-12-27 DIAGNOSIS — D61818 Other pancytopenia: Secondary | ICD-10-CM

## 2019-12-27 DIAGNOSIS — D709 Neutropenia, unspecified: Secondary | ICD-10-CM

## 2019-12-27 IMAGING — XA IR EXCHANGE NEPHROSTOMY RIGHT
1 series · 7 of 7 positions shown · non-contrast
Comparison: None.

INDICATION: History of recurrent endometrial cancer with chronic bilateral
percutaneous nephrostomy catheters for purposes urinary diversion.
TECHNIQUE: Informed written consent was obtained from the patient after a
discussion of the risks, benefits and alternatives to treatment.
Questions regarding the procedure were encouraged and answered. A
timeout was performed prior to the initiation of the procedure.

[Series 300: tube placements · 7 of 7 slices shown]
[im 1/7]
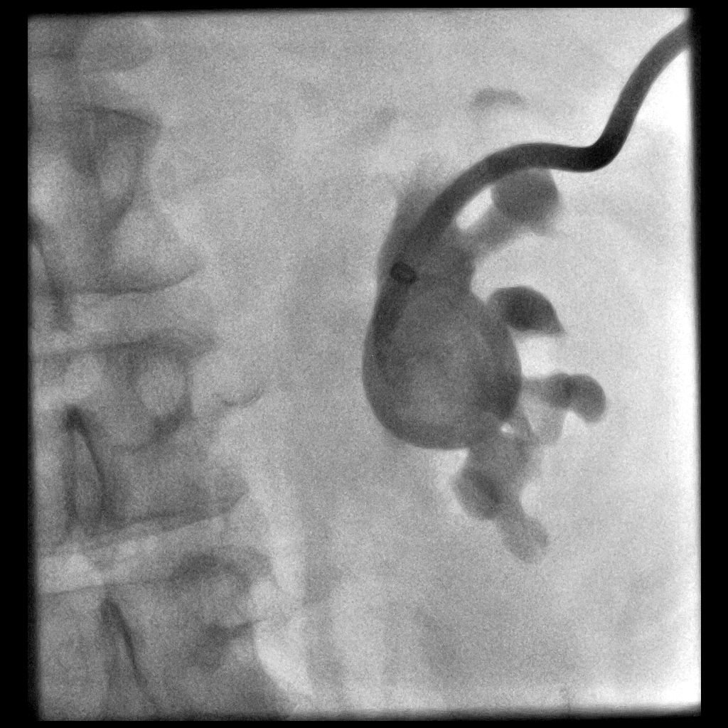
[im 2/7]
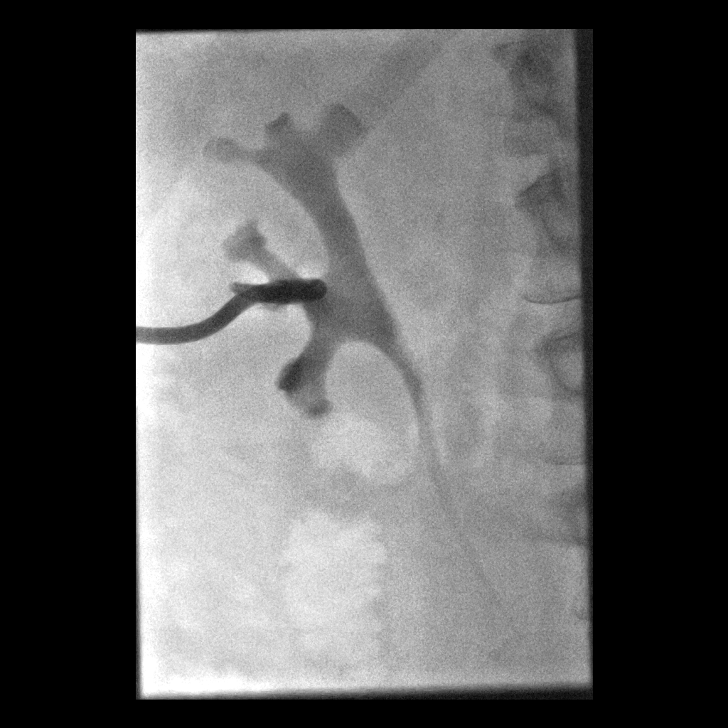
[im 3/7]
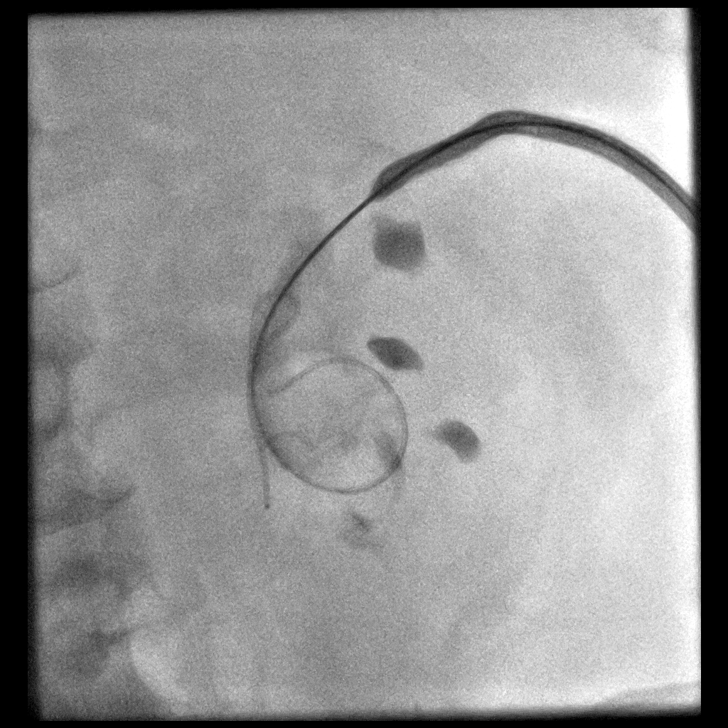
[im 4/7]
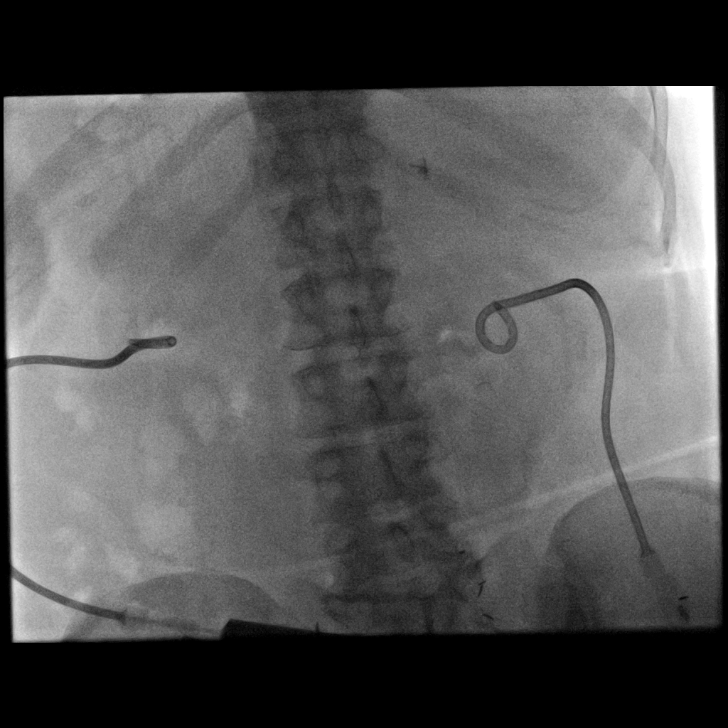
[im 5/7]
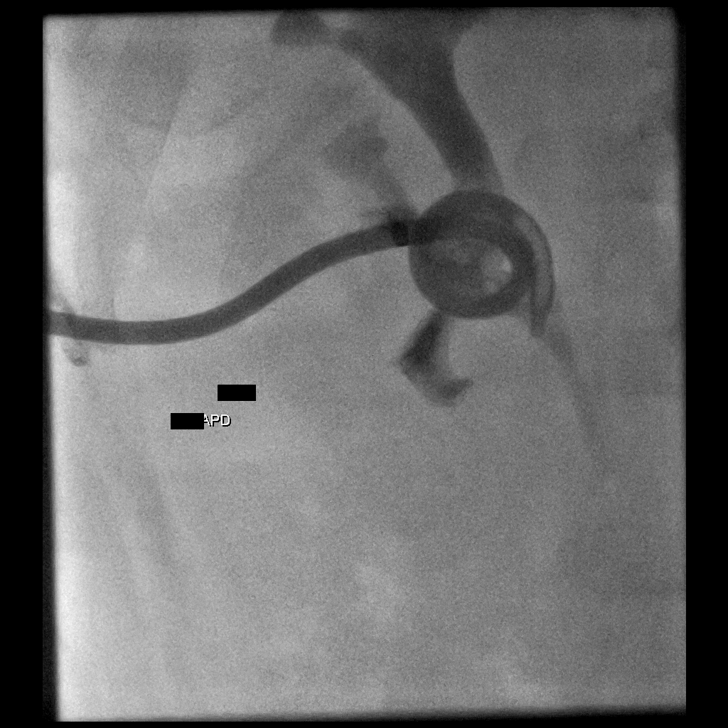
[im 6/7]
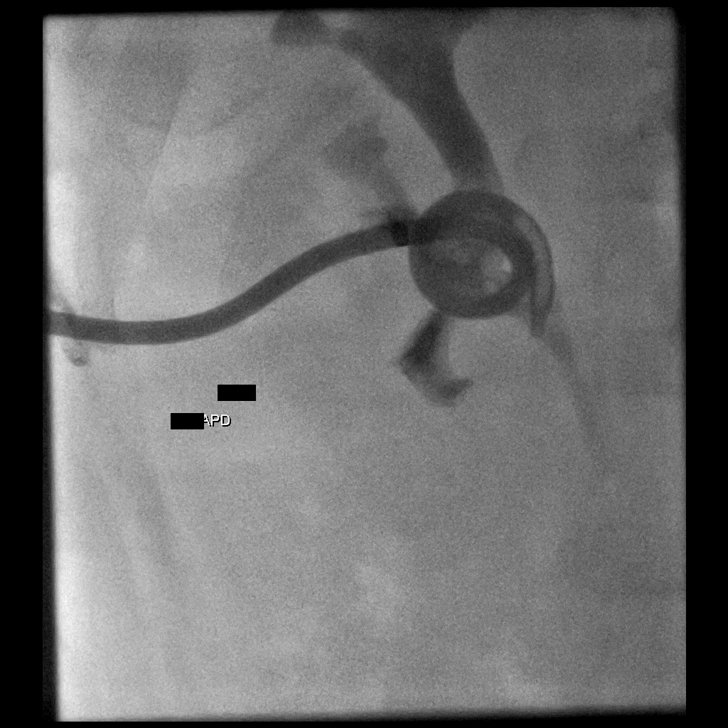
[im 7/7]
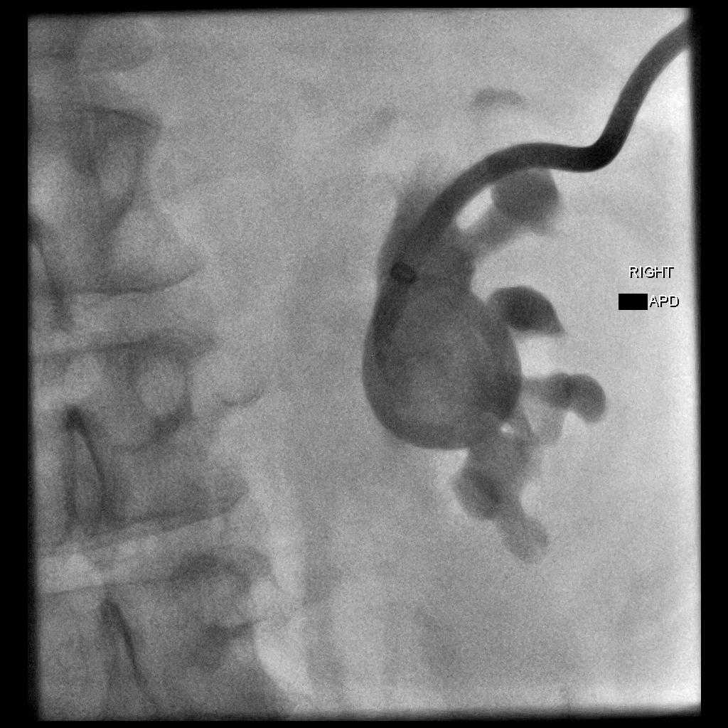

[7 of 7 positions shown; findings below may reference images not displayed]

catheter exchange. Given patient's history discomfort with previous
nephrostomy catheter exchange, today's procedure will be performed
with conscious sedation.

Patient has tolerated the bilateral percutaneous nephrostomy
catheter's very poorly since the time of initial placement.

Additionally, patient has experienced several episodes of inverted
retraction of the left-sided percutaneous nephrostomy catheter which
was found to be inadvertently retracted outside the left renal
collecting system CT scan of the abdomen pelvis performed 01/02/2019
for the purposes of malignancy surveillance.

EXAM:
FLUOROSCOPIC GUIDED BILATERAL SIDED NEPHROSTOMY CATHETER EXCHANGE
CONTRAST:  A total of 20 mL Ysovue-WRR administered was administered
into both collecting systems

FLUOROSCOPY TIME:  3 minutes, 6 seconds (65.8 mGy)

COMPLICATIONS:
None immediate.
The bilateral flanks and external portions of existing nephrostomy
catheters were prepped and draped in the usual sterile fashion. A
sterile drape was applied covering the operative field. Maximum
barrier sterile technique with sterile gowns and gloves were used
for the procedure. A timeout was performed prior to the initiation
of the procedure.

A pre procedural spot fluoroscopic image was obtained. Beginning
with the left-sided nephrostomy, a small amount of contrast was
injected via the existing left-sided nephrostomy catheter
demonstrating a catheter had inverted retracted to the periphery of
the left renal collecting system.

External portion of the distal nephrostomy catheter was cut and a
stiff Glidewire was cannulated within nephrostomy tube in advanced
with side hole to the level of the superior aspect the right ureter.

Under intermittent fluoroscopic guidance, the existing malpositioned
12 Chai Tiger type nephrostomy catheter was exchanged for
a standard 12 French all-purpose with end coiled and locked within
left renal pelvis. Note, the decision was made to convert the
existing Asraf Ally Thatiah catheter to a standard 12 French drainage
catheter given patient's history of multiple previous episodes of
inverted retraction/removal. Contrast injection confirmed
appropriate positioning and a post exchange fluoroscopic image was
obtained. The catheter was locked and reconnected to a gravity bag.

Preprocedural spot fluoroscopic image demonstrates appropriate
positioning of the right-sided percutaneous Asraf Ally Thatiah type
nephrostomy catheter.

External portion of nephrostomy catheter was cut and a short Amplatz
wire was coiled within the right renal pelvis. Under intermittent
fluoroscopic guidance, the existing 12 Chai Tiger type
nephrostomy catheter was exchanged for a standard 12 French
all-purpose drainage catheter with end coiled and locked within the
right renal pelvis. Contrast injection confirmed appropriate
positioning and a post exchange fluoroscopic image was obtained. The
catheter was locked and reconnected to a gravity bag.

Dressings were applied. The patient tolerated the procedure well
without immediate postprocedural complication.
FINDINGS: The existing left-sided nephrostomy catheter is retracted to the
periphery of the left renal collecting system. Under fluoroscopic
guided exchange, the existing 12 Chai Tiger type was
exchanged for a new standard 12 French drainage catheter with end
coiled and locked within the left renal pelvis.

Existing right-sided nephrostomy catheter is appropriately
positioned and functioning. After fluoroscopic guided exchange, the
existing 12 Chai Tiger catheter was exchanged for a new
standard type 12 French drainage catheter with end coiled and locked
within the right renal pelvis.
IMPRESSION: Successful fluoroscopic guided exchange and conversion of existing
12 Chai Tiger type catheters to standard 12 French
percutaneous drainage catheters.

Note, the decision was made to convert the existing Asraf Ally Thatiah
type percutaneous drainage catheters to standard drainage catheters
given patient's history of multiple inadvertent retractions and
removals, given that the standard drainage catheters have a larger
coil loop which will (hopefully) afford the patient greater catheter
stability.

## 2019-12-27 MED ORDER — NIVESTYM 480 MCG/0.8ML IJ SOSY: PREFILLED_SYRINGE | INTRAMUSCULAR | 11 refills | Status: DC

## 2019-12-28 ENCOUNTER — Other Ambulatory Visit: Payer: Self-pay

## 2019-12-28 ENCOUNTER — Other Ambulatory Visit: Payer: Self-pay | Admitting: Hematology and Oncology

## 2019-12-28 ENCOUNTER — Telehealth: Payer: Self-pay

## 2019-12-28 DIAGNOSIS — L89319 Pressure ulcer of right buttock, unspecified stage: Secondary | ICD-10-CM | POA: Diagnosis not present

## 2019-12-28 DIAGNOSIS — Z936 Other artificial openings of urinary tract status: Secondary | ICD-10-CM | POA: Diagnosis not present

## 2019-12-28 DIAGNOSIS — Z933 Colostomy status: Secondary | ICD-10-CM | POA: Diagnosis not present

## 2019-12-28 DIAGNOSIS — Z4801 Encounter for change or removal of surgical wound dressing: Secondary | ICD-10-CM | POA: Diagnosis not present

## 2019-12-28 MED ORDER — TBO-FILGRASTIM 480 MCG/0.8ML ~~LOC~~ SOSY: PREFILLED_SYRINGE | SUBCUTANEOUS | 11 refills | Status: DC

## 2019-12-28 NOTE — Telephone Encounter (Signed)
Called and spoke with [name]. She does not want to switch from [facility] to [place]. Her husband called [facility] and told them that they do not want the [facility]. [facility] told him [facility] would continue with the [facility]. She has 4 injections of the Granix left. She is due for a injection tomorrow.

## 2019-12-28 NOTE — Telephone Encounter (Signed)
Called [facility] regarding Nivestym Rx. Husband called and canceled Rx. They need a new Rx for the Granix. Prior authorization ran out earlier this month.

## 2019-12-28 NOTE — Telephone Encounter (Signed)
-----   Message from Heath Lark, MD sent at 12/27/2019  4:03 PM EDT ----- Regarding: I sent it electronically to [facility]. Please call and check if it can be authorized. ask patient how many more days of injection she has left

## 2019-12-30 ENCOUNTER — Other Ambulatory Visit: Payer: Self-pay | Admitting: Hematology and Oncology

## 2020-01-03 ENCOUNTER — Telehealth: Payer: Self-pay | Admitting: *Deleted

## 2020-01-03 ENCOUNTER — Telehealth: Payer: Self-pay | Admitting: Interventional Cardiology

## 2020-01-03 NOTE — Telephone Encounter (Signed)
Jaicee is returning Jennifer's call. Please advise.

## 2020-01-03 NOTE — Telephone Encounter (Signed)
Granix 480 mcg/ 0.59ml authorization approved 11-27-2019 through 01/26/2020 per [facility] Automated phone service [phone]), Appeals Department, Case ID: [id number].  Collaborative notified.

## 2020-01-03 NOTE — Telephone Encounter (Signed)
Informed pt of results. Pt verbalized understanding. 

## 2020-01-08 IMAGING — CT CT ABDOMEN AND PELVIS WITH CONTRAST
2 of 5 series · 16 of 46 positions shown, 18 images · IV contrast (APPLIED)
Comparison: CT abdomen and pelvis 01/02/2019

CLINICAL DATA: Abdominal pain, unspecified.

EXAM:
CT ABDOMEN AND PELVIS WITH CONTRAST
TECHNIQUE: Multidetector CT imaging of the abdomen and pelvis was performed
using the standard protocol following bolus administration of
intravenous contrast.
CONTRAST:  80mL OMNIPAQUE IOHEXOL 300 MG/ML  SOLN

[Series 3: abdomen 5.0 · axial · 0.91mm/px · z∈[+848,+1248]mm · 13 of 94 slices shown, 15 images]
[im 7/94  soft-tissue]
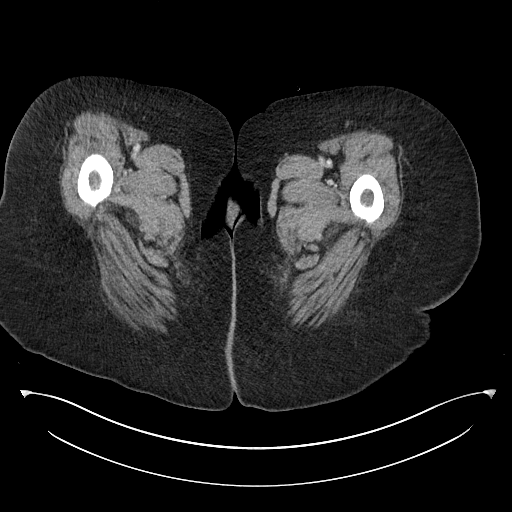
[im 7/94  bone]
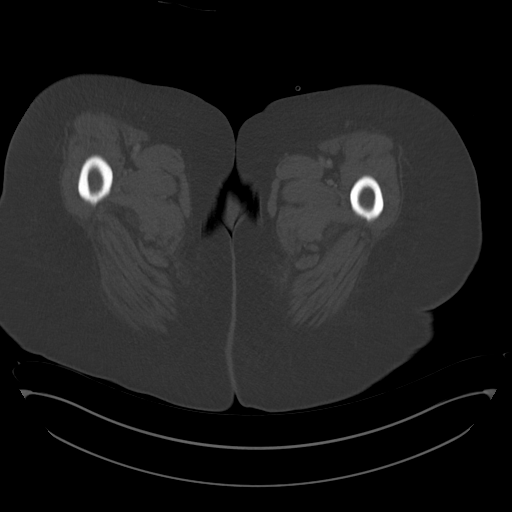
[im 14/94  soft-tissue]
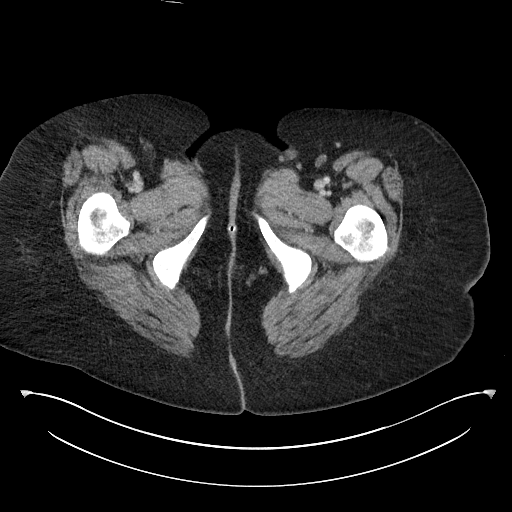
[im 20/94  soft-tissue]
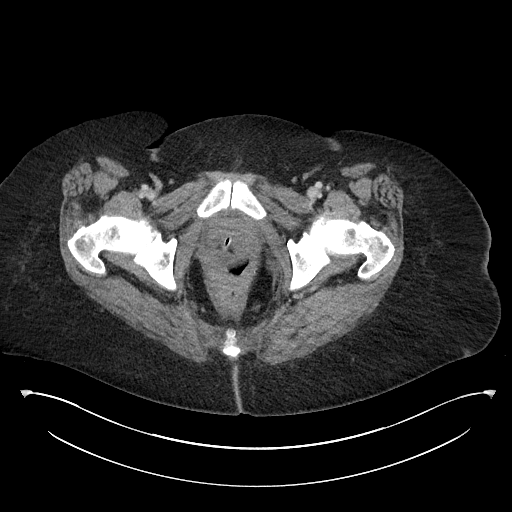
[im 27/94  soft-tissue]
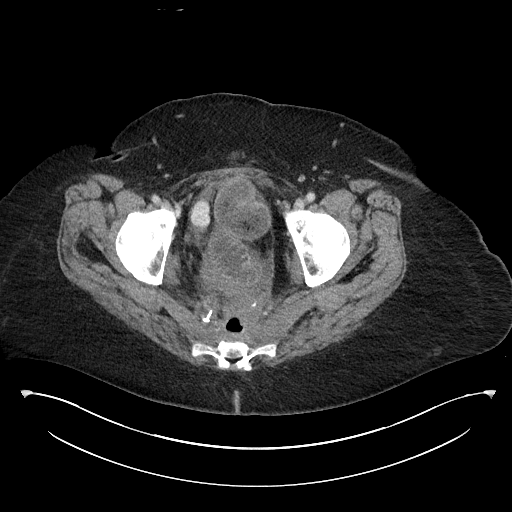
[im 34/94  soft-tissue]
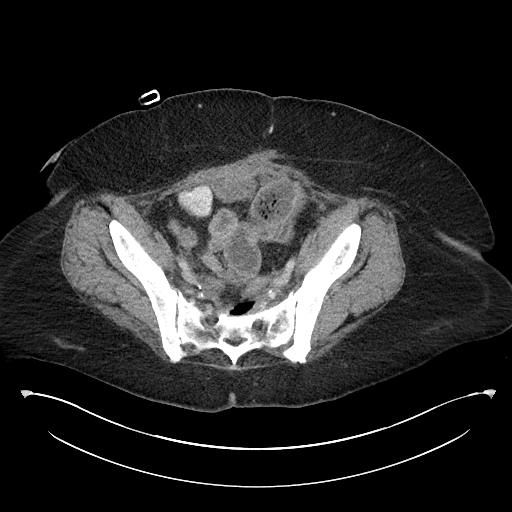
[im 40/94  soft-tissue]
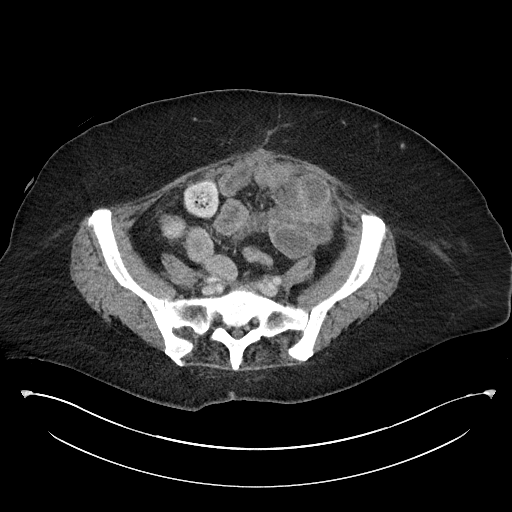
[im 47/94  soft-tissue]
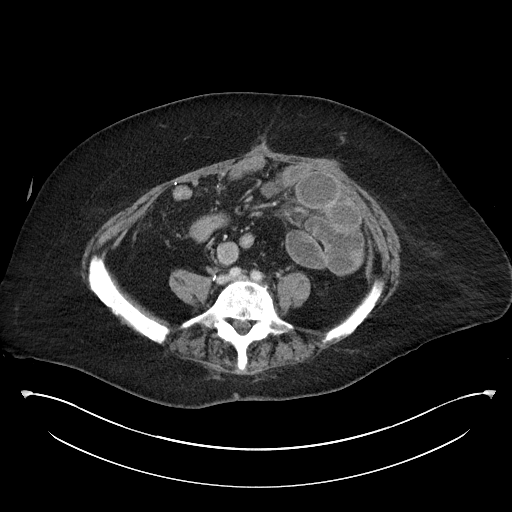
[im 54/94  soft-tissue]
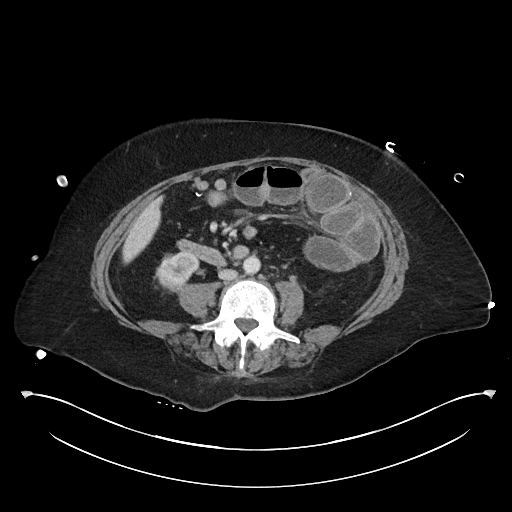
[im 60/94  soft-tissue]
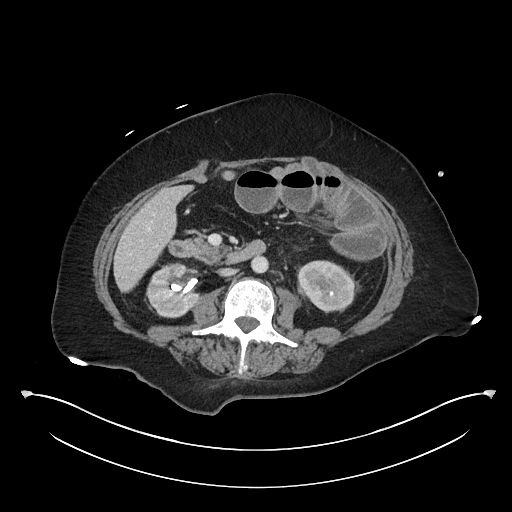
[im 60/94  bone]
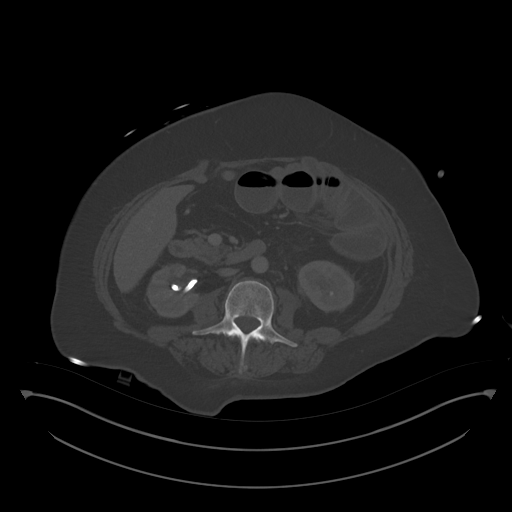
[im 67/94  soft-tissue]
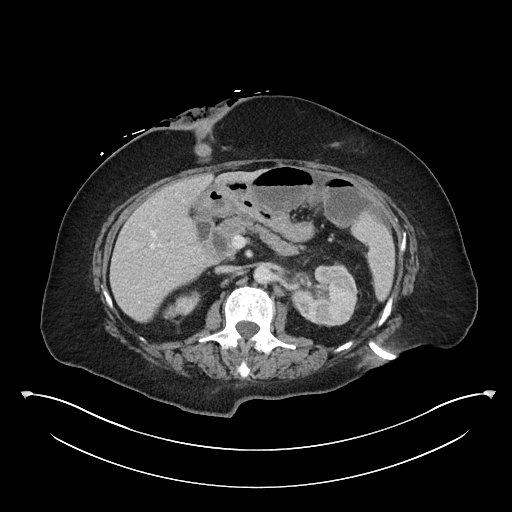
[im 74/94  soft-tissue]
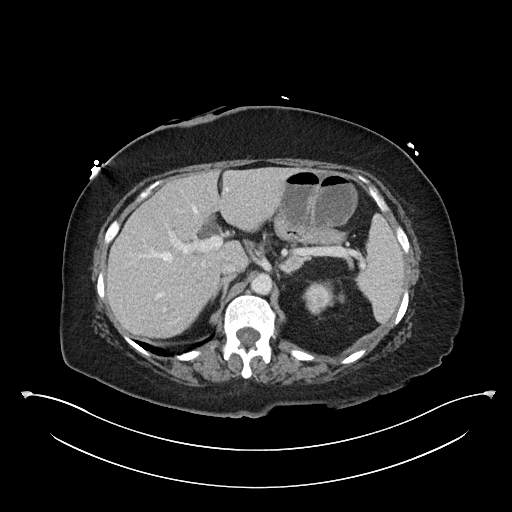
[im 80/94  soft-tissue]
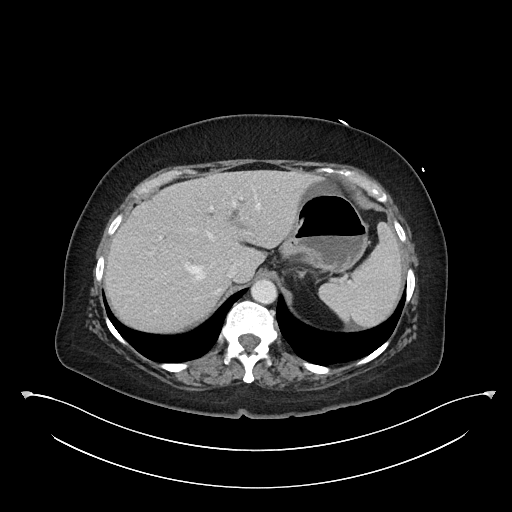
[im 87/94  soft-tissue]
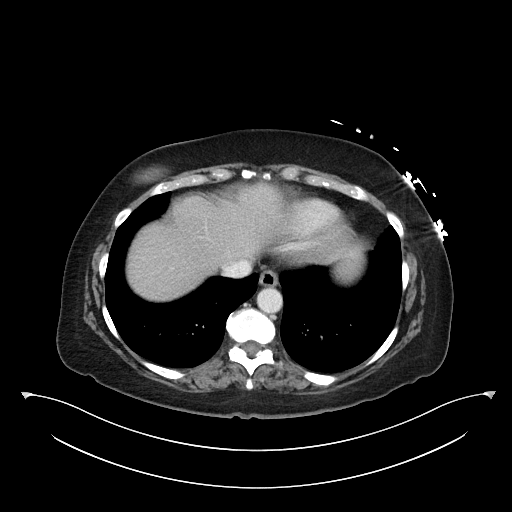

[Series 6: abdomen 3.0 mpr cor · coronal · 0.92mm/px · 3 of 107 slices shown]
[im 36/107  soft-tissue]
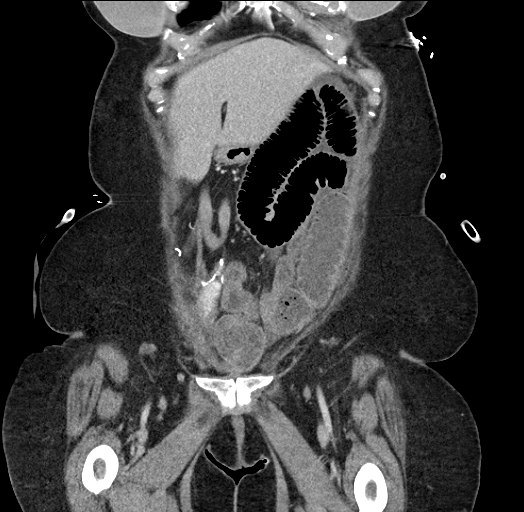
[im 48/107  soft-tissue]
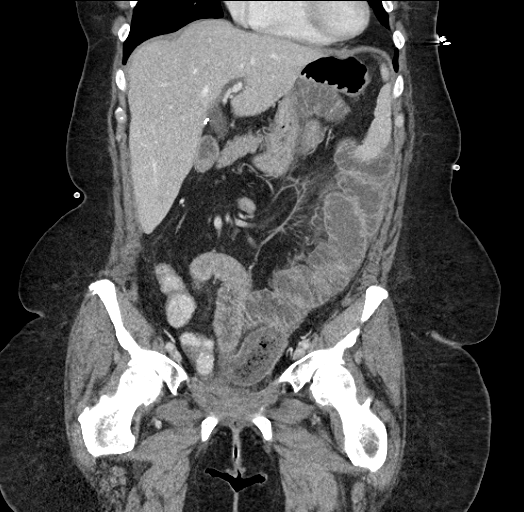
[im 59/107  soft-tissue]
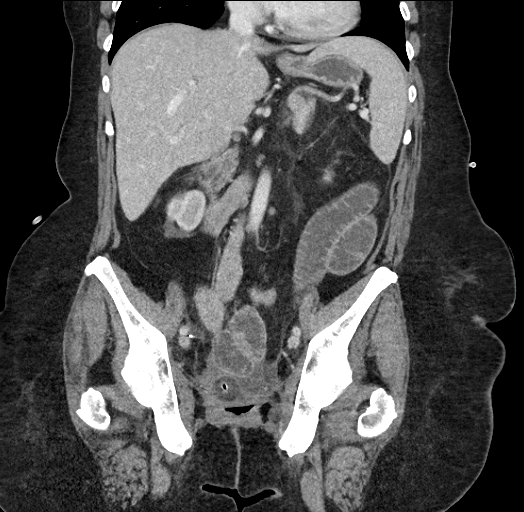

[16 of 46 positions shown; findings below may reference images not displayed]

FINDINGS: Lower chest: Lung bases are clear without focal nodule, mass, or
airspace disease. The heart size is normal. No significant pleural
or pericardial effusion is present.

Hepatobiliary: Mild diffuse fatty infiltration of the liver is
present. No discrete lesions are evident. There is mild intra and
extrahepatic biliary dilation following cholecystectomy. The biliary
ducts are slightly more prominent than on the prior study. No
obstructing lesion is evident.

Pancreas: Unremarkable. No pancreatic ductal dilatation or
surrounding inflammatory changes.

Spleen: Normal in size without focal abnormality.

Adrenals/Urinary Tract: Adrenal glands are normal bilaterally.
Bilateral percutaneous nephrostomy catheters are in place. The
collecting systems are decompressed. There is no stone or mass
lesion in either kidney. Ureters are within normal limits. A Foley
catheter is present in the urinary bladder.

Stomach/Bowel: Stomach is within normal limits. Duodenum is
unremarkable. Dilated loops of small bowel extend into the left
upper quadrant. There are multiple small ventral hernias. The
transition point appears to be just below the umbilicus. The
obstruction is most likely due to adhesions. Although there is a
small ventral hernia, no definite strangulated bowel is evident. A
loops of more distal decompressed bowel extends into a more superior
left ventral hernia. This is chronic. The ostomy is decompressed.
Residual colon is unremarkable.

Vascular/Lymphatic: Presacral soft tissue is stable. Retroperitoneal
dissection is noted. No new adenopathy is present.

Reproductive: Postsurgical changes are noted.

Other: No free fluid is present.  There is no free air.

Musculoskeletal: Vertebral body heights and alignment are
maintained. Slight degenerative anterolisthesis at L3-4 is again
noted. There straightening of the normal lumbar lordosis. Bony
pelvis is within normal limits. The hips are located and normal.
IMPRESSION: 1. Small bowel obstruction. Transition is near the umbilicus, likely
related to adhesions. No definite strangulated bowel is present.
2. Decompressed small bowel extends into a separate ventral hernia
more superiorly and to the left.
3. Ostomy is decompressed.
4. Bilateral percutaneous nephrostomy drains in place. Collecting
systems are decompressed.
5. Foley catheter in place.
6. Stable postsurgical changes of the pelvis.

These results were called by telephone at the time of interpretation
on 01/17/2019 at [DATE] to NII AKWEI QUACOO, PA , who verbally
acknowledged these results.

## 2020-01-08 IMAGING — DX PORTABLE ABDOMEN - 1 VIEW
1 series · 1 of 1 positions shown · non-contrast
Comparison: One-view abdomen 01/17/2019

CLINICAL DATA: NG tube placement.

EXAM:
PORTABLE ABDOMEN - 1 VIEW

[abdomen]
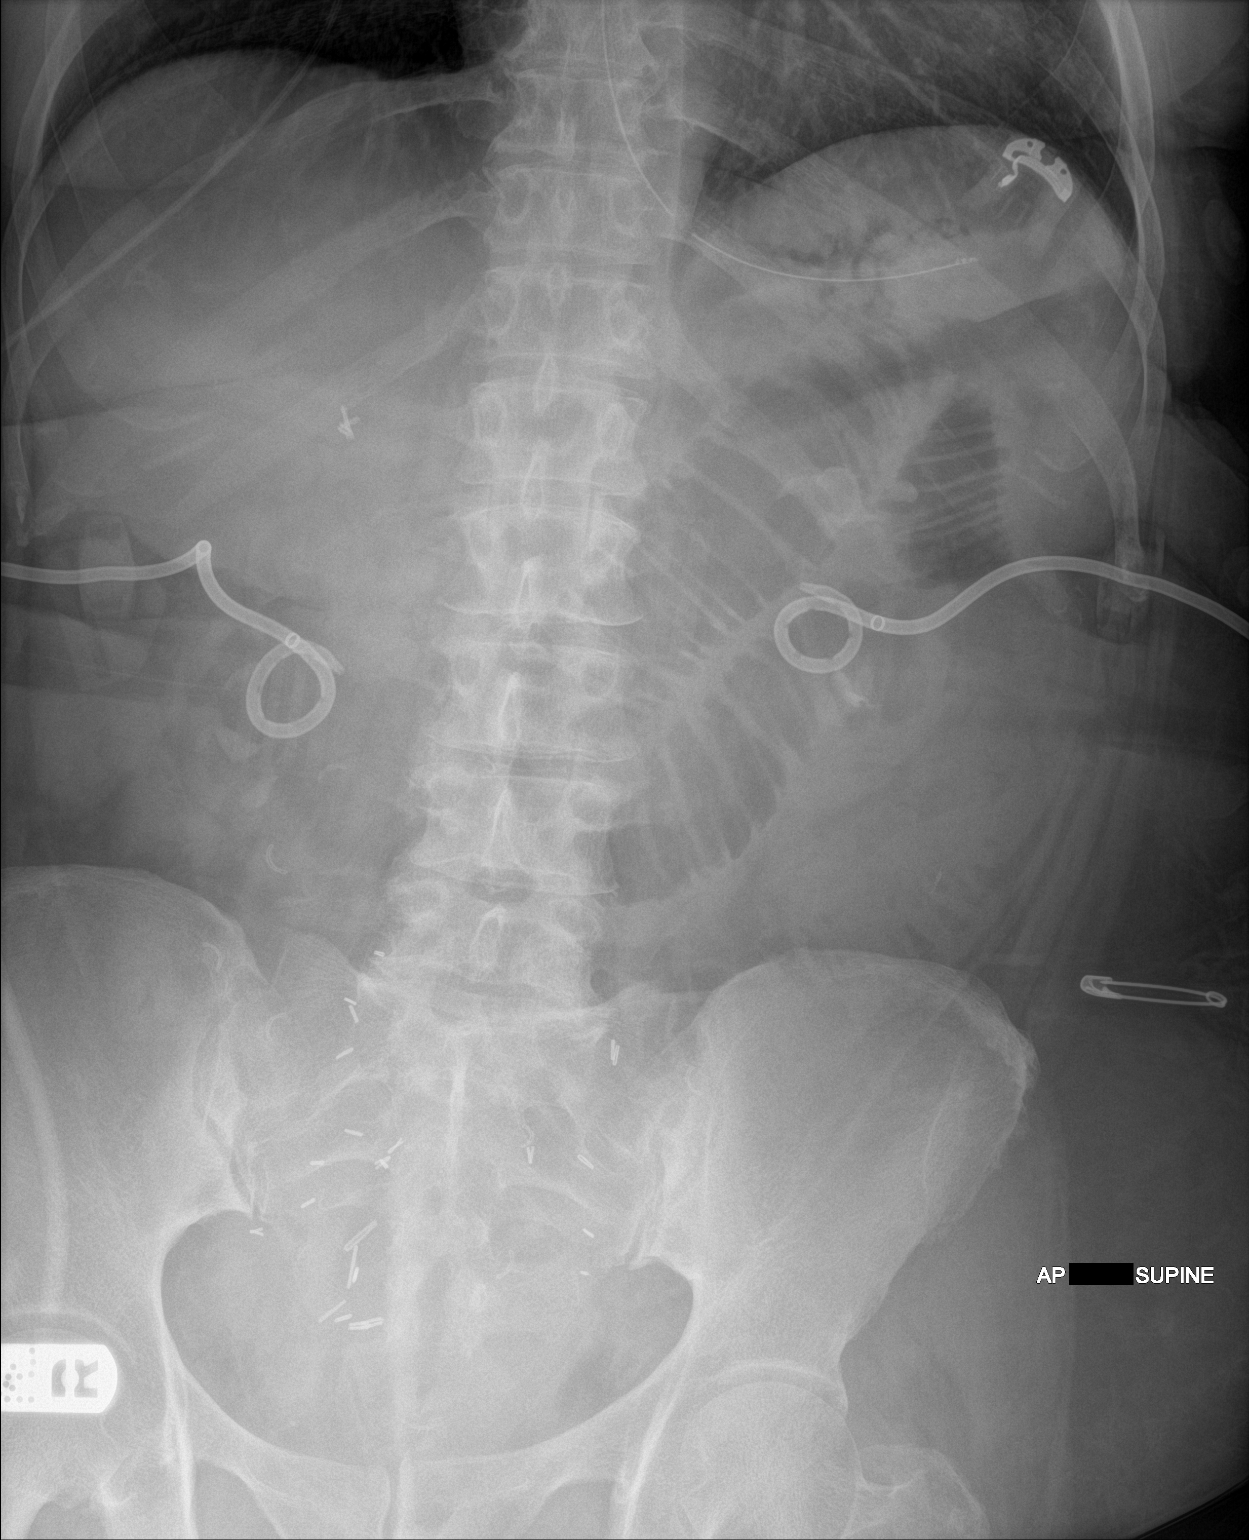

[1 of 1 positions shown; findings below may reference images not displayed]

FINDINGS: Side port of the NG tube is now at the GE junction. Would advance 5
cm for more optimal positioning. Dilated loops of bowel are again
noted in the left upper quadrant. The stomach is collapsed.
Nephrostomy tubes are stable.
IMPRESSION: 1. The NG tube scratched at the side port of the NG tube is at the
GE junction. Would advance 5 cm for more optimal positioning.

## 2020-01-08 IMAGING — DX PORTABLE ABDOMEN - 1 VIEW
1 series · 1 of 1 positions shown · non-contrast
Comparison: 01/17/2019

CLINICAL DATA: Small bowel obstruction

EXAM:
PORTABLE ABDOMEN - 1 VIEW

[abdomen kub]
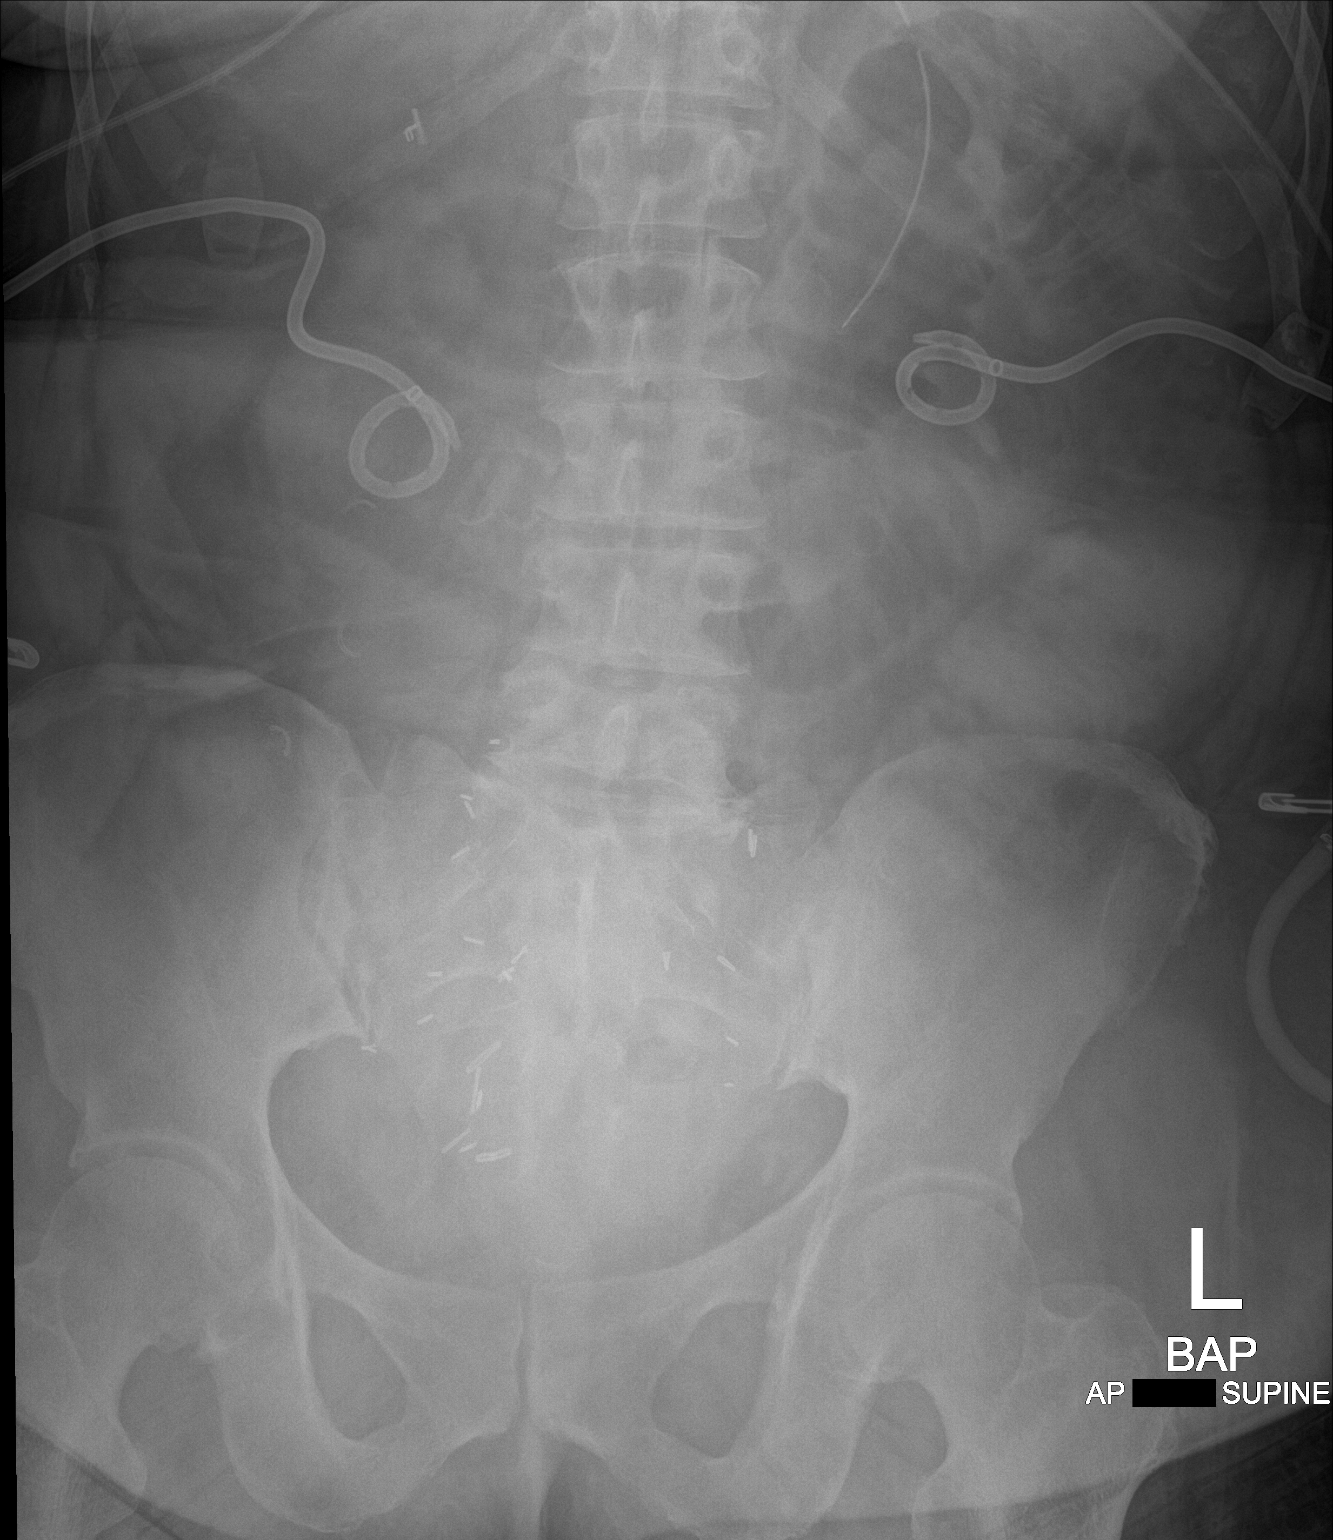

[1 of 1 positions shown; findings below may reference images not displayed]

FINDINGS: Bilateral nephrostomy catheters and NG tube remain in place,
unchanged. Dilated small bowel loops noted in the abdomen compatible
with small bowel obstruction. No significant change since prior
study. Prior cholecystectomy. No organomegaly or visible free air.
IMPRESSION: Stable small bowel obstruction pattern.

## 2020-01-08 IMAGING — CR PORTABLE ABDOMEN - 1 VIEW
1 series · 1 of 1 positions shown · non-contrast
Comparison: CT of the abdomen and pelvis 01/17/2019

CLINICAL DATA: NG tube placement.

EXAM:
PORTABLE ABDOMEN - 1 VIEW

[AP]
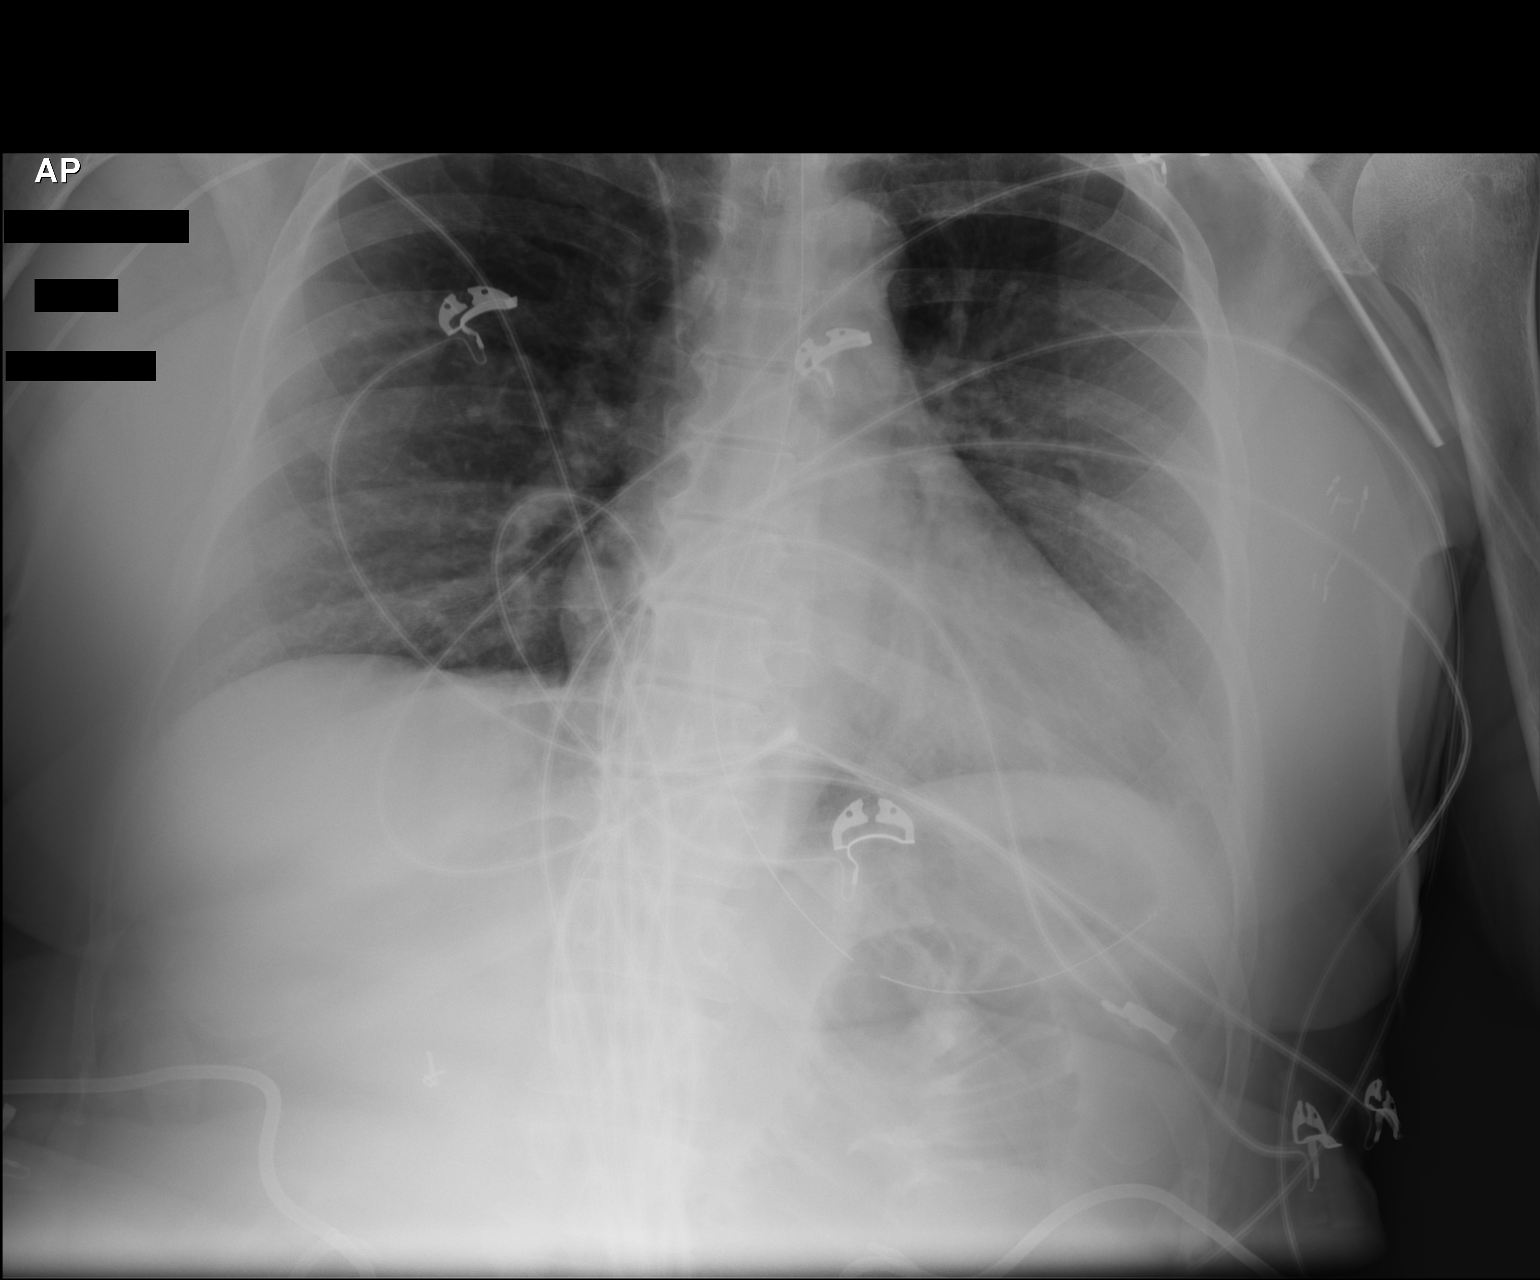

[1 of 1 positions shown; findings below may reference images not displayed]

FINDINGS: Right-sided PICC line terminates in the mid SVC.

Heart size is exaggerated by low lung volumes.  Lungs are clear.

Side port of the NG tube is in the fundus of the stomach.

Dilated loops of small bowel are again noted in the left upper
quadrant.

Bilateral percutaneous nephrostomy tubes are noted.
IMPRESSION: 1. Side port of the NG tube is in the fundus the stomach.
2. Persistent dilated small bowel in the left upper quadrant.

## 2020-01-09 IMAGING — CR ABDOMEN - 1 VIEW
1 series · 1 of 1 positions shown · non-contrast
Comparison: 01/17/2019

CLINICAL DATA: Small-bowel protocol. 24 hour delay after
Gastrografin administration.

EXAM:
ABDOMEN - 1 VIEW

[AP]
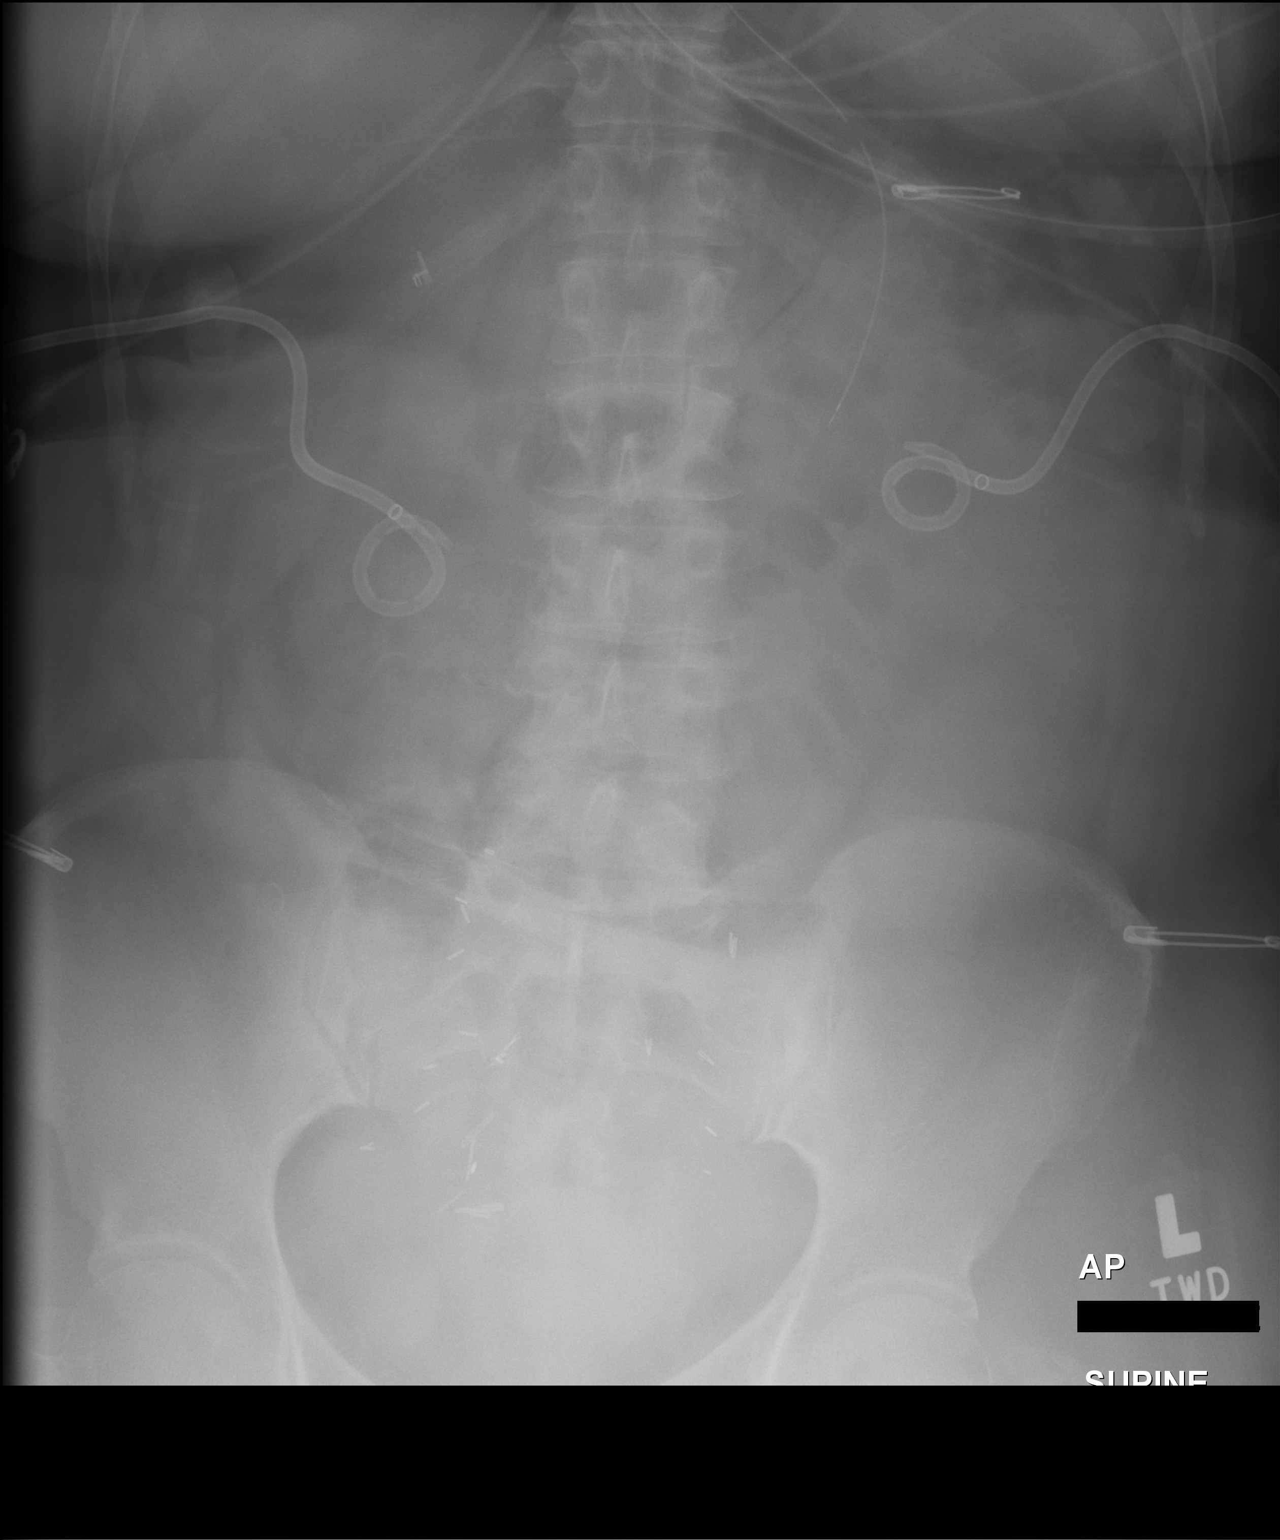

[1 of 1 positions shown; findings below may reference images not displayed]

FINDINGS: Stable position of the bilateral nephrostomy tubes. Nasogastric tube
in region of the stomach body. There continues to be gas-filled
loops of small bowel in the mid abdomen. Oral contrast is not well
demonstrated on this examination. Again noted are multiple surgical
clips in the abdominopelvic region. Limited evaluation for free air
on this supine image.
IMPRESSION: Persistent gas-filled loops of bowel in the central abdomen. Bowel
gas is less obvious on this examination and may be related to recent
Gastrografin administration. Findings remain compatible with a small
bowel obstruction.

## 2020-01-10 IMAGING — DX PORTABLE CHEST - 1 VIEW
1 series · 1 of 1 positions shown · non-contrast
Comparison: 10/04/2018 and 11/29/2017 radiographs.

CLINICAL DATA: history of endometrial and ovarian cancer status
post surgery complicated with infection and presently patient has
bilateral nephrostomy ileostomy and Foley catheter was recently
treated for UTI with IV antibiotics until yesterday

EXAM:
PORTABLE CHEST 1 VIEW

[chest ap]
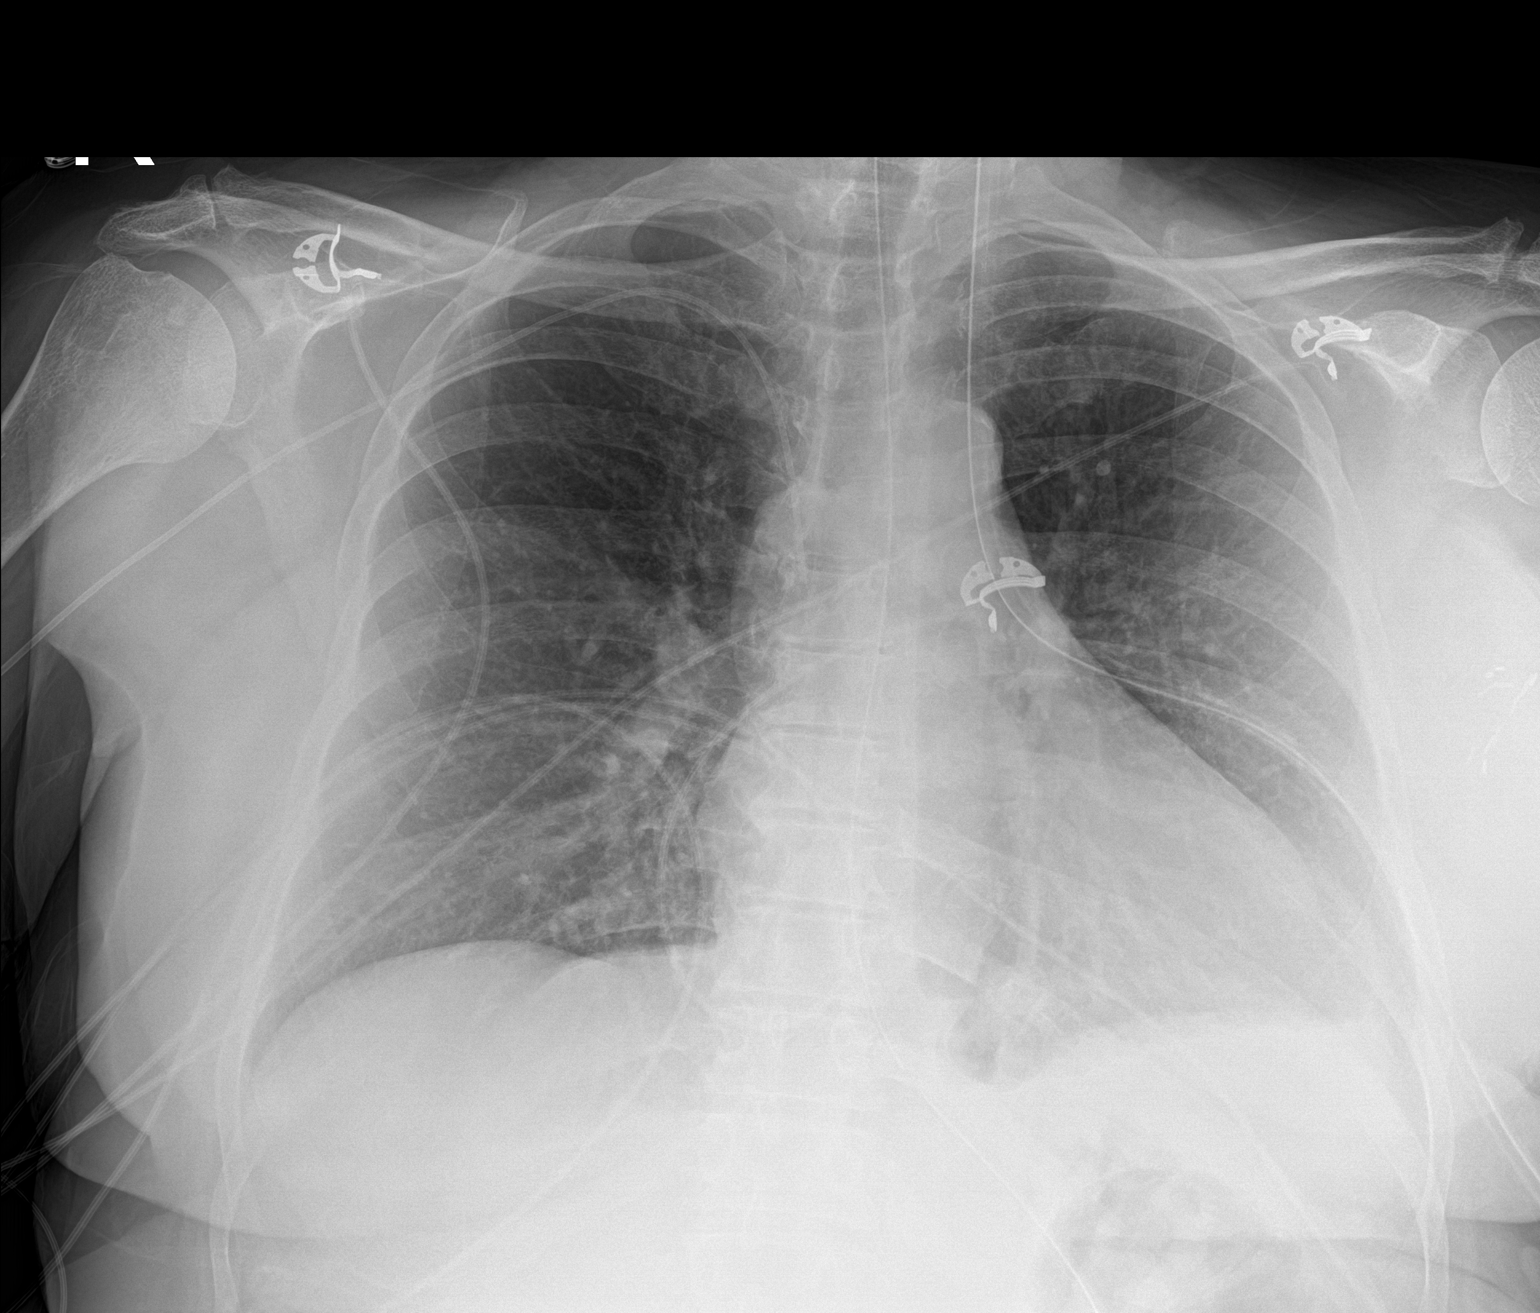

[1 of 1 positions shown; findings below may reference images not displayed]

FINDINGS: Enteric tube projects below the diaphragm, tip not visualized. Right
arm PICC is unchanged at the mid SVC level. The heart size and
mediastinal contours are stable. There is mildly increased opacity
at the left lung base, probably atelectasis. No pneumothorax or
significant pleural effusion.
IMPRESSION: Satisfactory position of the enteric tube. Mildly increased left
basilar atelectasis.

## 2020-01-10 IMAGING — CR PORTABLE ABDOMEN - 1 VIEW
1 series · 1 of 1 positions shown · non-contrast
Comparison: 01/18/2019

CLINICAL DATA: Followup small-bowel obstruction.

EXAM:
PORTABLE ABDOMEN - 1 VIEW

[AP]
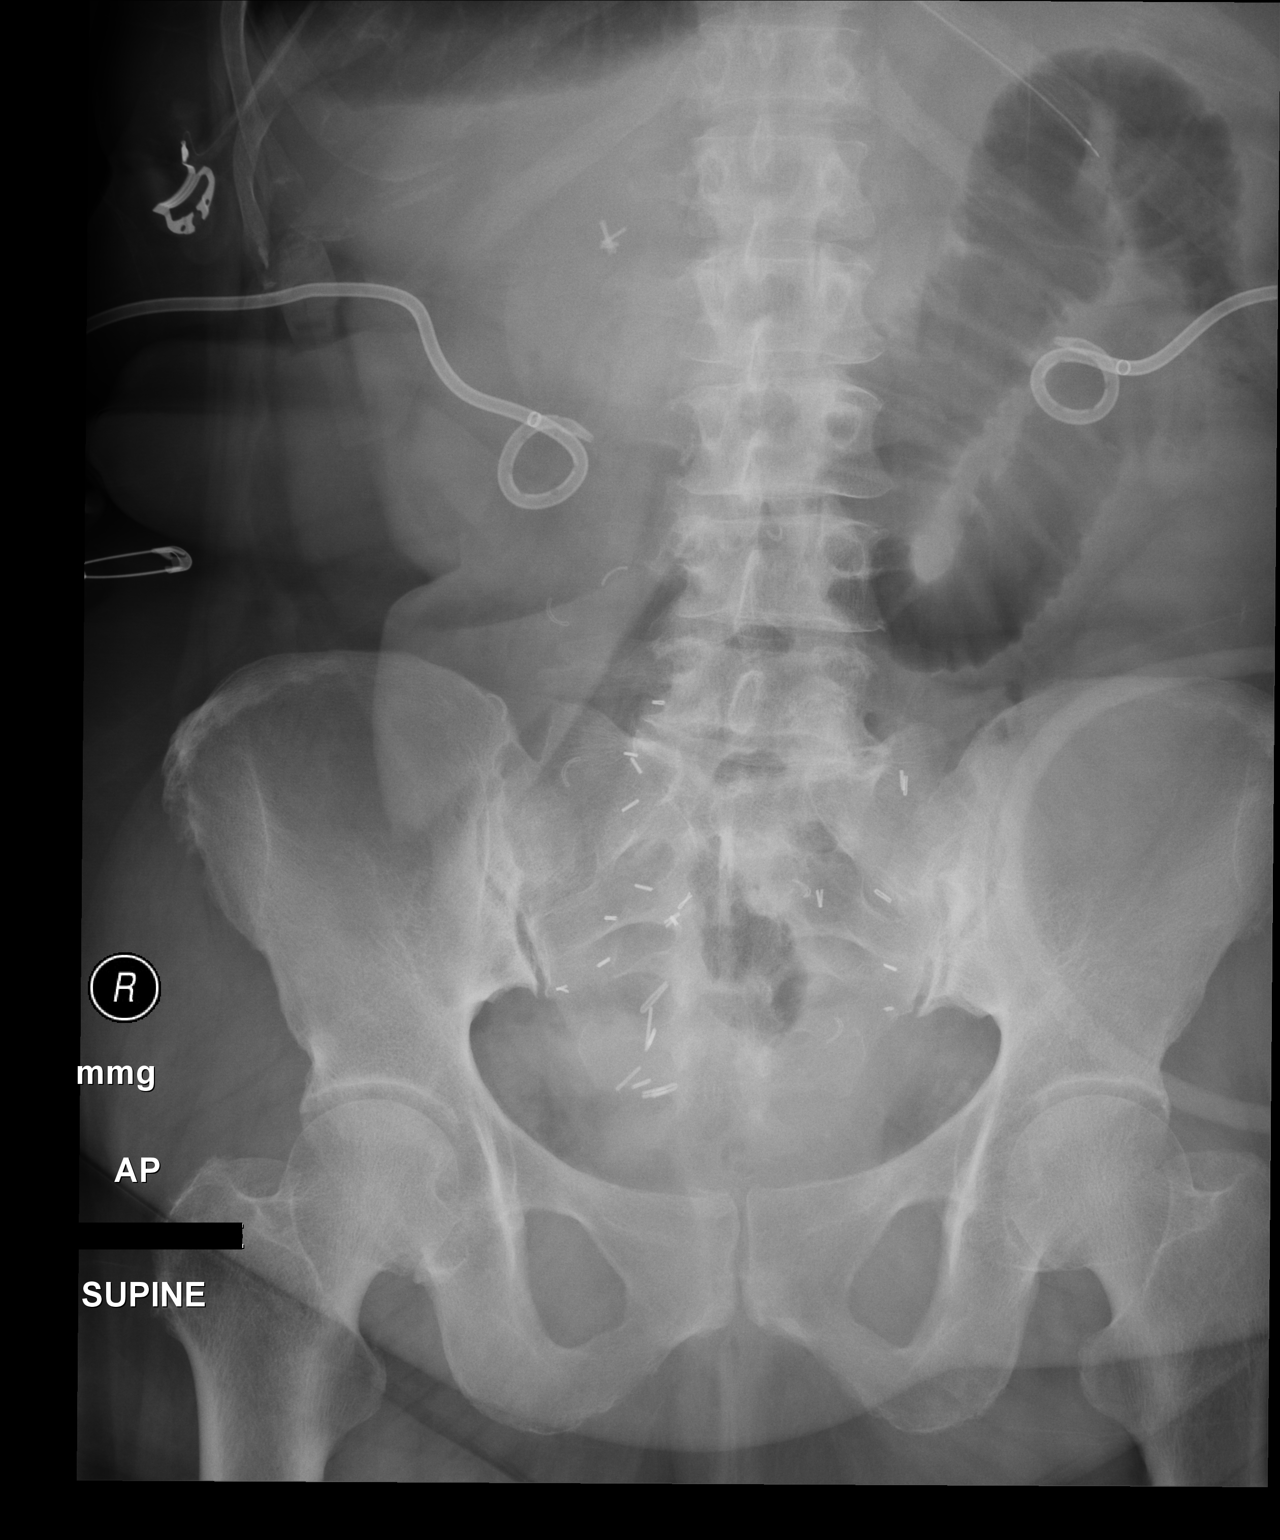

[1 of 1 positions shown; findings below may reference images not displayed]

FINDINGS: Bilateral nephrostomy tubes are stable. The NG tube is in the fundal
region of the stomach and the proximal port is likely at or above
the GE junction.

Single loop of air distended small bowel in the left upper quadrant
but overall slight improved bowel gas pattern. No free air.
IMPRESSION: 1. The NG tube tip has pulled back into the fundal region.
2. Single slightly dilated air-filled loop of small bowel in the
left upper quadrant but overall improved bowel gas pattern.

## 2020-01-11 IMAGING — DX PORTABLE ABDOMEN - 1 VIEW
2 series · 2 of 2 positions shown · non-contrast
Comparison: 01/19/2019

CLINICAL DATA: Small bowel obstruction.

EXAM:
PORTABLE ABDOMEN - 1 VIEW

[abdomen kub (1 of 2)]
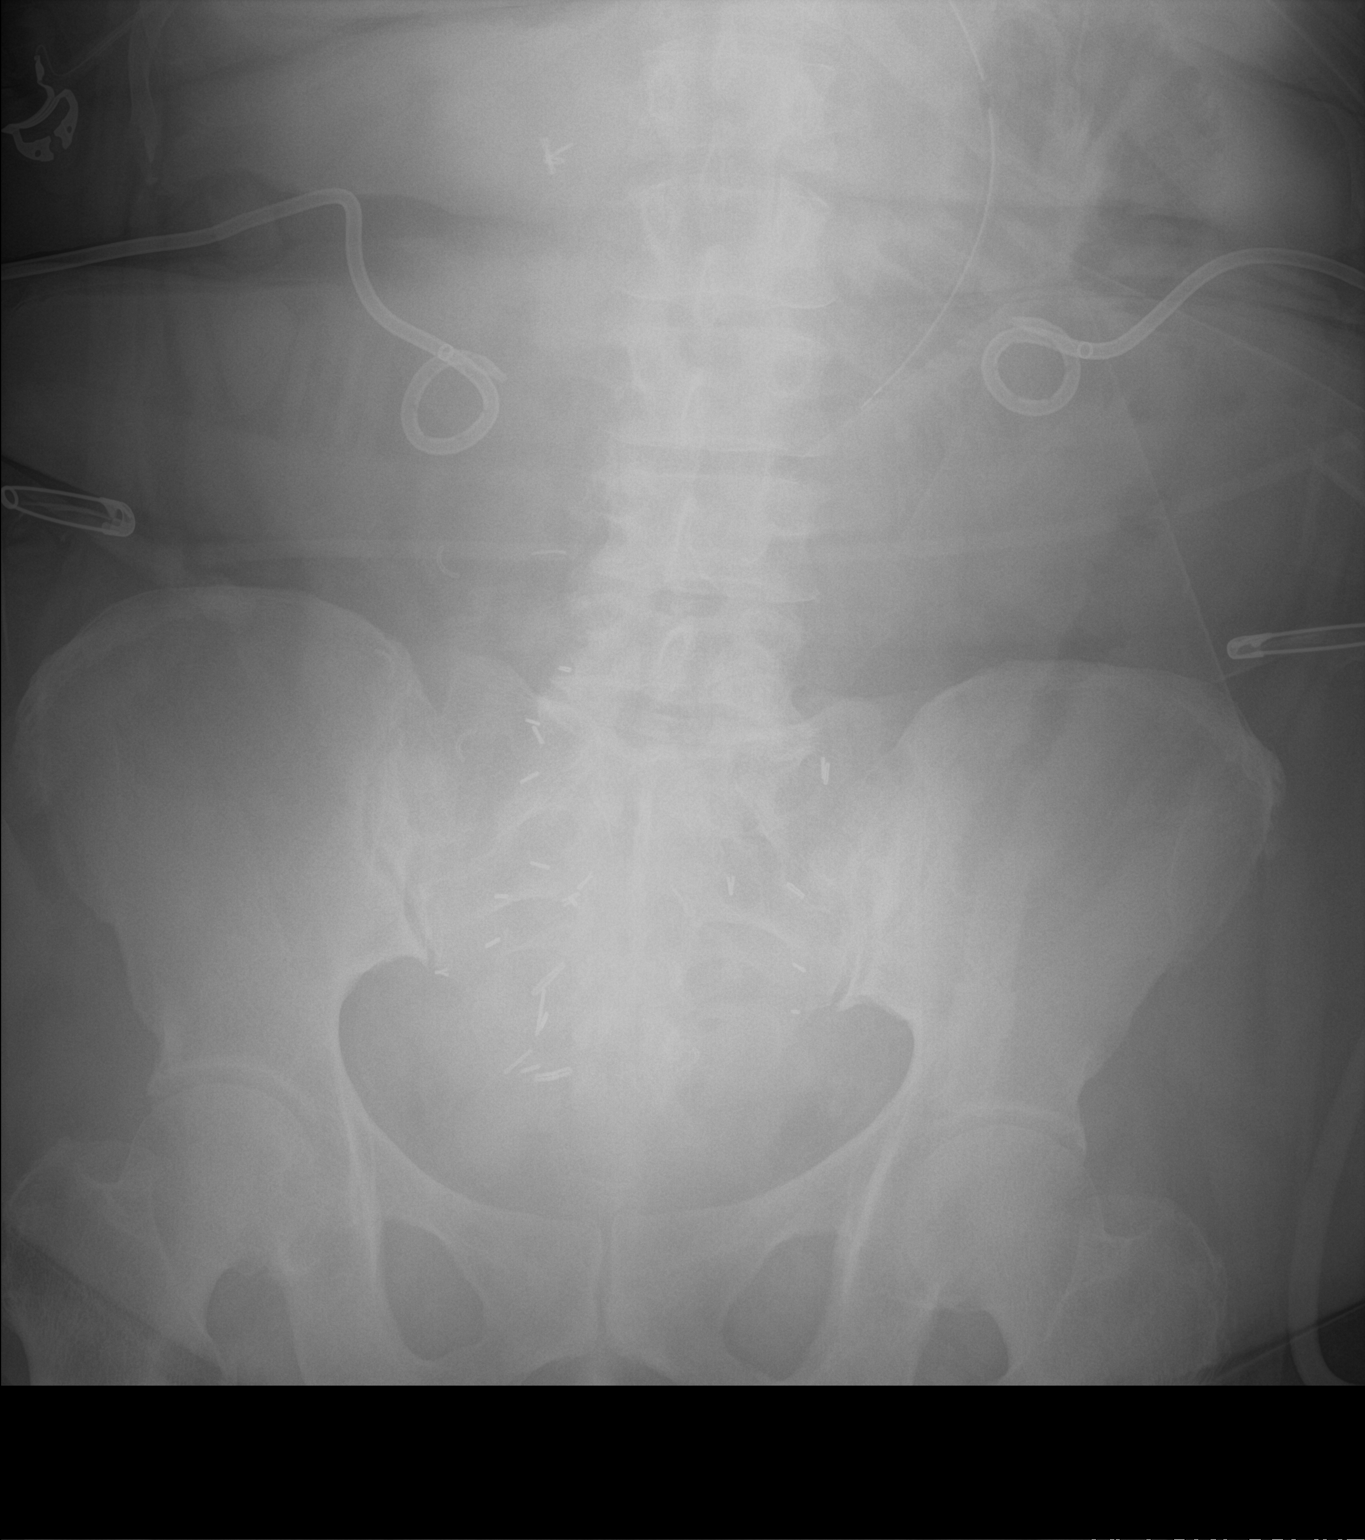

[abdomen kub (2 of 2)]
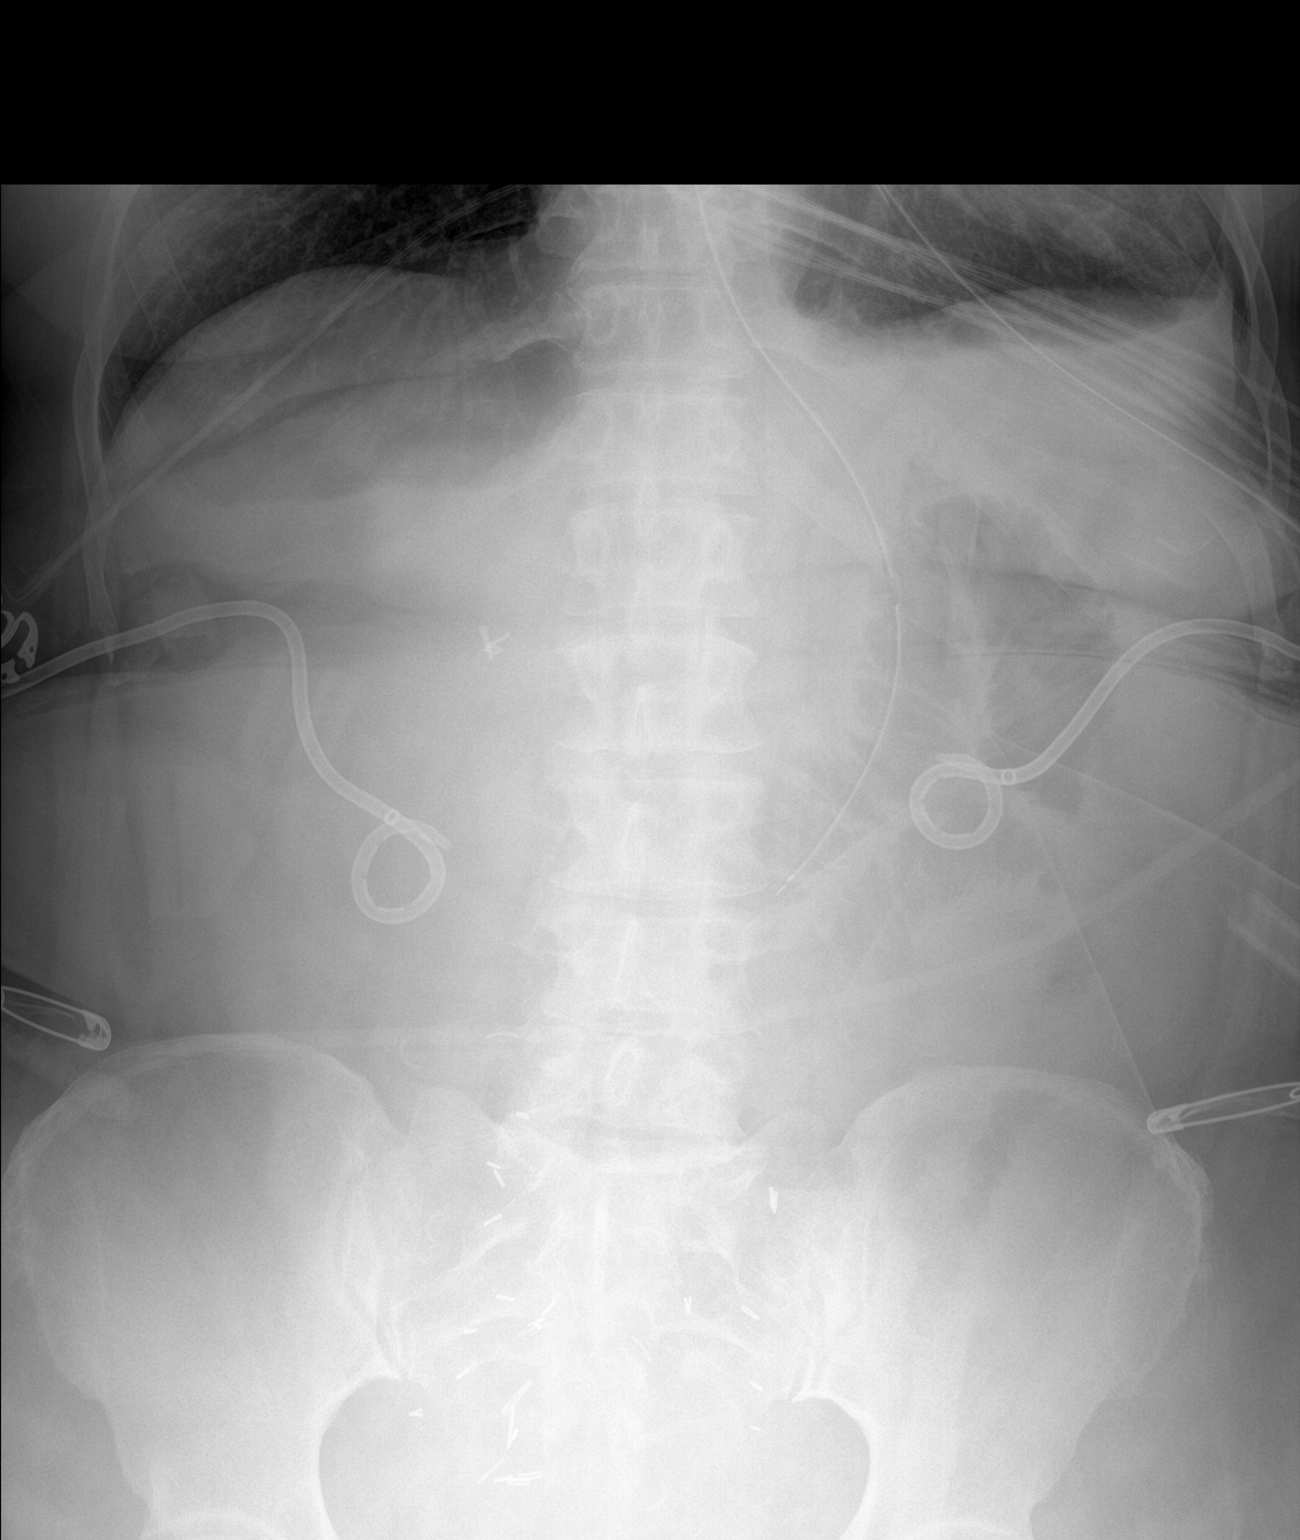

[2 of 2 positions shown; findings below may reference images not displayed]

FINDINGS: Relative paucity of bowel gas. Persistent mildly dilated small bowel
loop in the left upper quadrant of the abdomen measures 3.3 cm
transversely. Enteric catheter overlies the expected location of the
stomach. Bilateral PICC tail catheters are stable. Postsurgical
changes in the pelvis.
IMPRESSION: 1. Relative paucity of bowel gas.
2. Persistent mildly dilated small bowel loop in the left upper
quadrant of the abdomen.

## 2020-01-12 IMAGING — CR ABDOMEN - 2 VIEW
2 series · 2 of 2 positions shown · non-contrast
Comparison: 01/20/2019

CLINICAL DATA: Small bowel obstruction.

EXAM:
ABDOMEN - 2 VIEW

[abdomen erect]
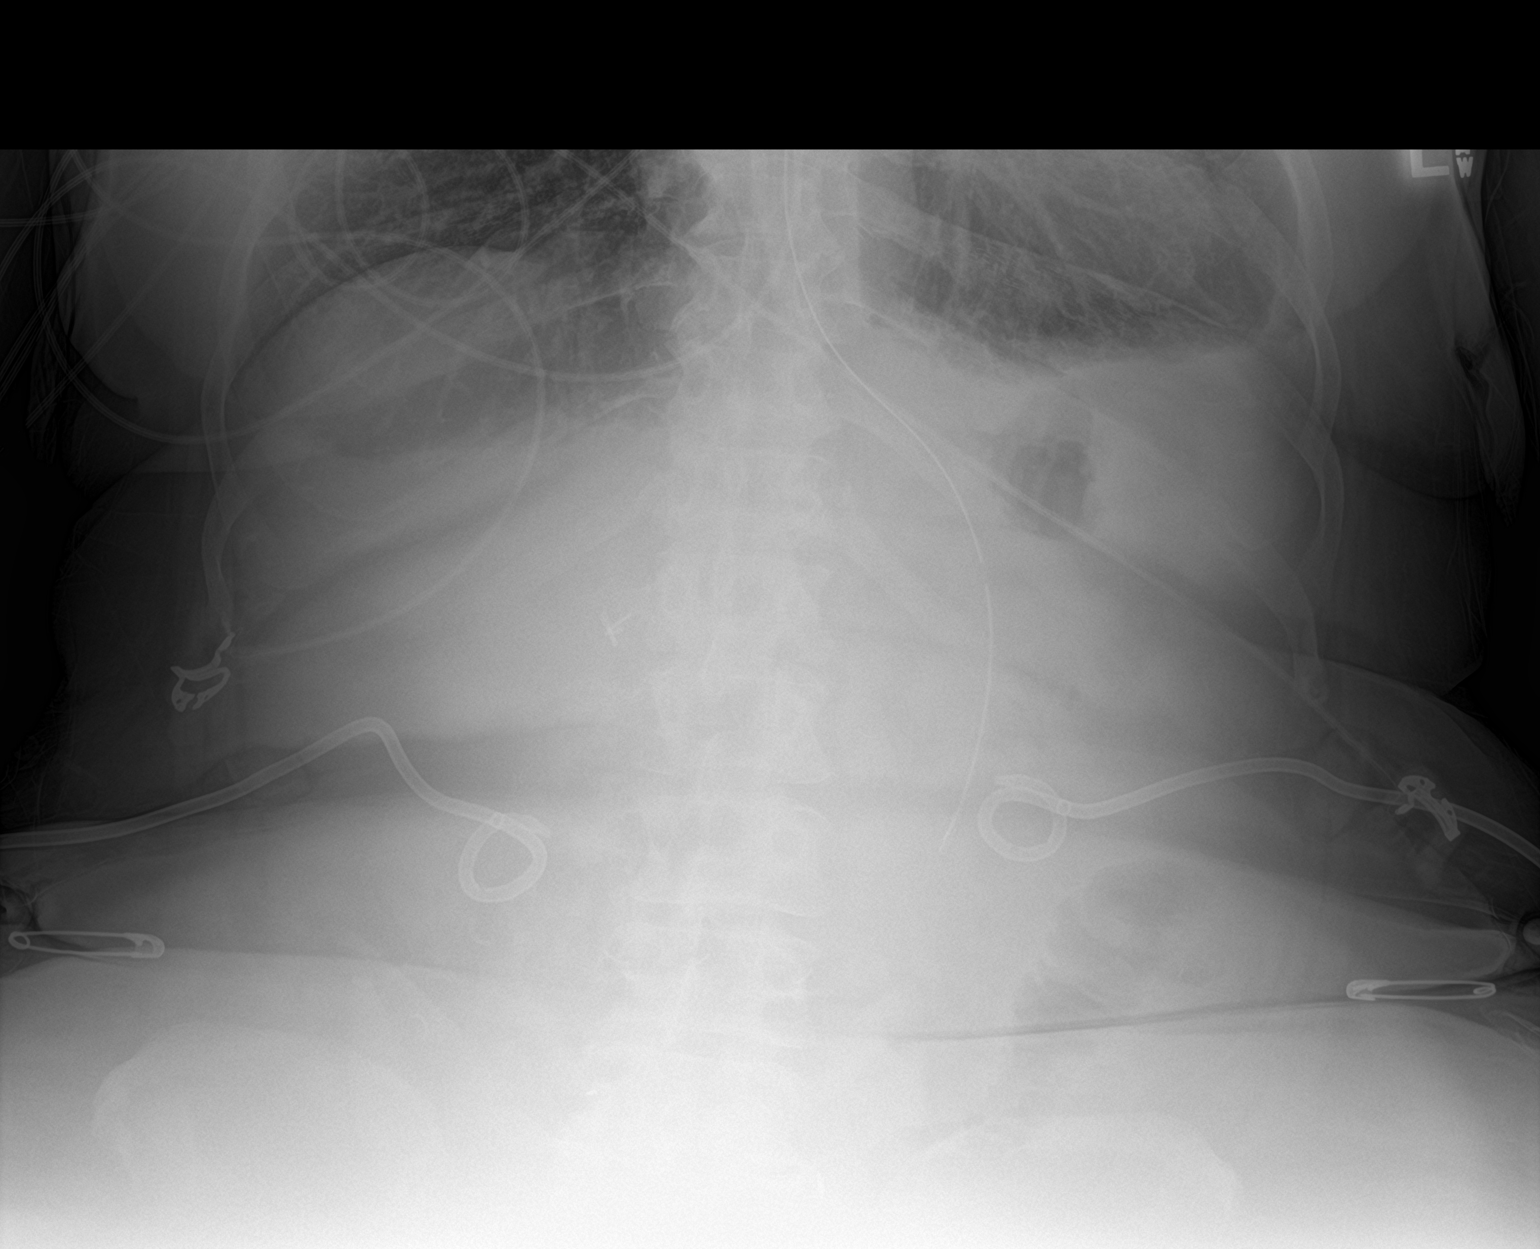

[abdomen supine]
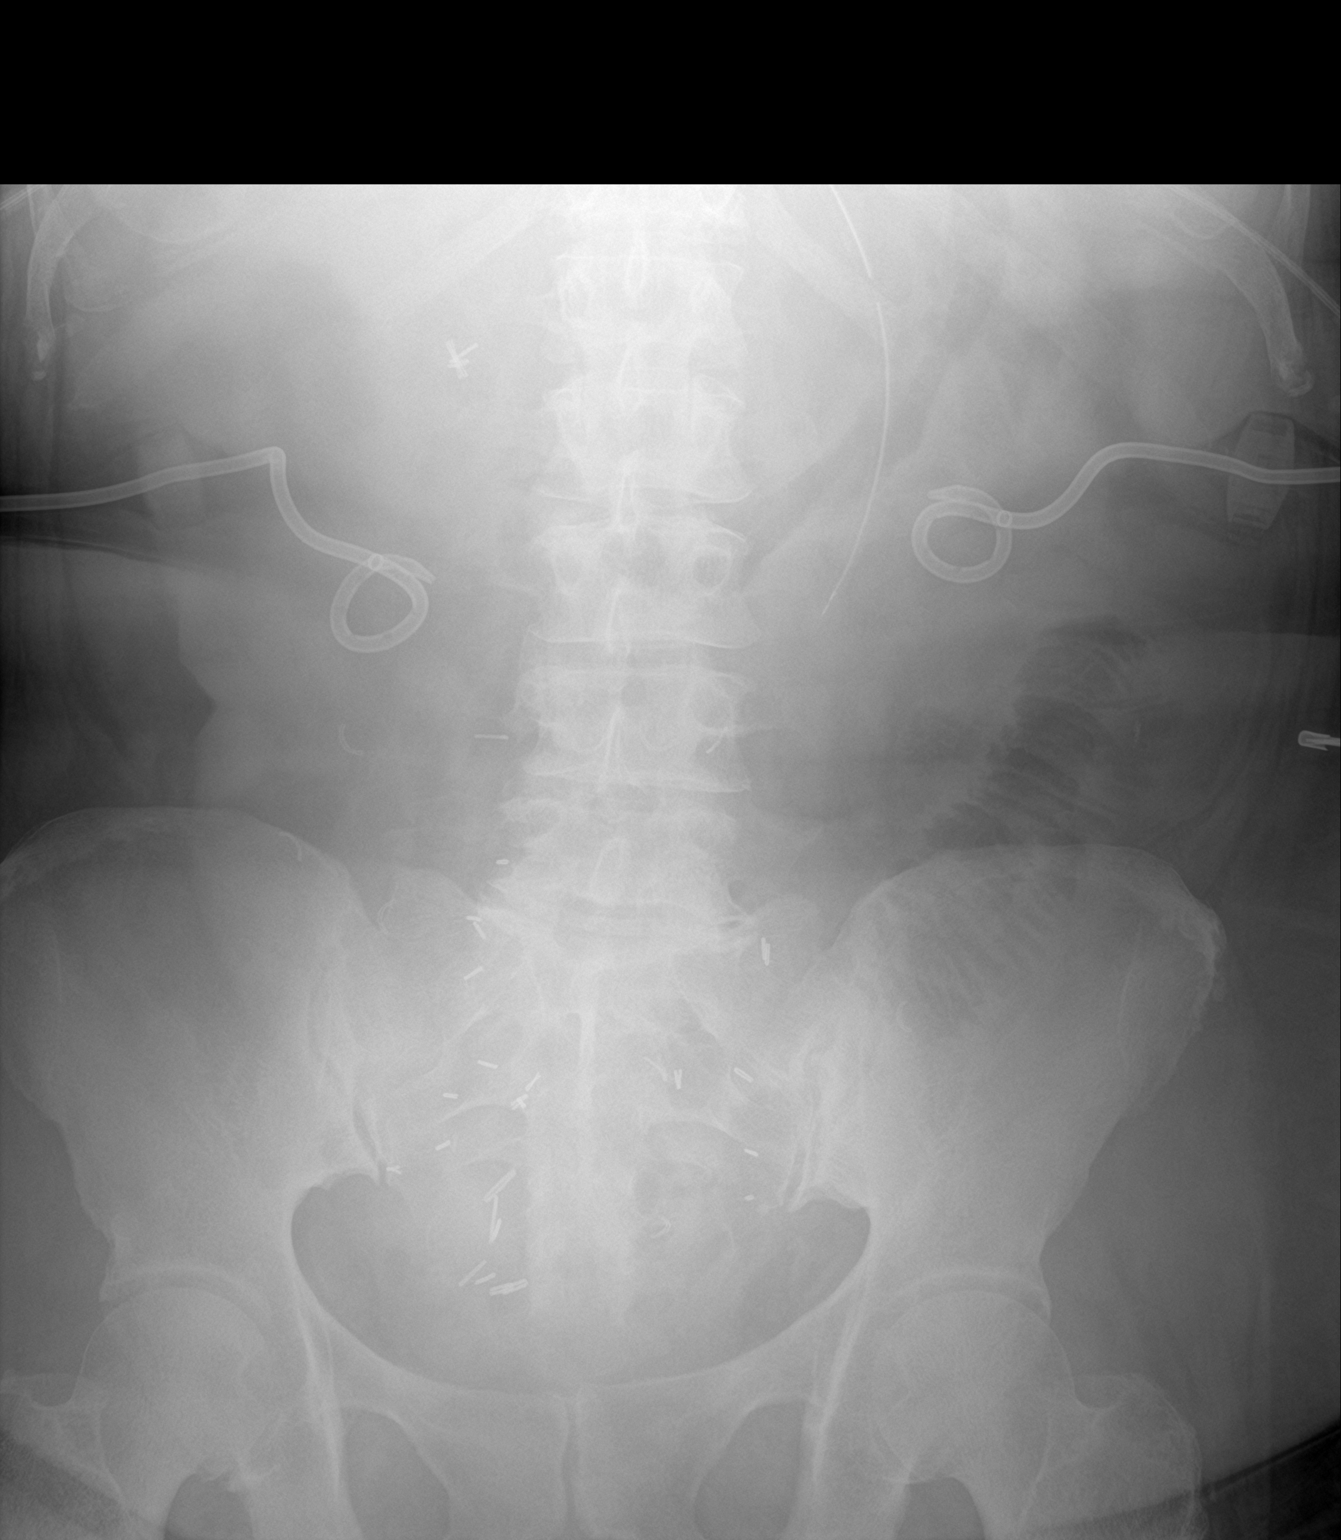

[2 of 2 positions shown; findings below may reference images not displayed]

FINDINGS: Nasogastric tube unchanged with tip over the stomach in the left
upper quadrant. Bilateral percutaneous nephrostomy tubes unchanged.
Single air-filled dilated small bowel loop in the left lower
quadrant. Relative paucity of bowel gas throughout the remainder of
the abdomen. No free peritoneal air. Multiple surgical clips over
the midline lower abdomen/pelvis. Surgical clips in the right upper
quadrant. Remainder the exam is unchanged.
IMPRESSION: Persistent single air-filled mildly dilated small bowel loop in the
left lower quadrant.

Multiple tubes and lines as described.

## 2020-01-15 ENCOUNTER — Other Ambulatory Visit: Payer: Self-pay

## 2020-01-15 ENCOUNTER — Encounter: Payer: Self-pay | Admitting: Gynecologic Oncology

## 2020-01-15 ENCOUNTER — Inpatient Hospital Stay: Payer: BC Managed Care – PPO | Attending: Gynecologic Oncology | Admitting: Gynecologic Oncology

## 2020-01-15 VITALS — BP 115/60 | HR 65 | Temp 98.8°F | Resp 17 | Ht 62.0 in | Wt 200.6 lb

## 2020-01-15 DIAGNOSIS — Z8543 Personal history of malignant neoplasm of ovary: Secondary | ICD-10-CM | POA: Insufficient documentation

## 2020-01-15 DIAGNOSIS — N898 Other specified noninflammatory disorders of vagina: Secondary | ICD-10-CM | POA: Insufficient documentation

## 2020-01-15 DIAGNOSIS — D708 Other neutropenia: Secondary | ICD-10-CM

## 2020-01-15 DIAGNOSIS — Z803 Family history of malignant neoplasm of breast: Secondary | ICD-10-CM | POA: Diagnosis not present

## 2020-01-15 DIAGNOSIS — Z9221 Personal history of antineoplastic chemotherapy: Secondary | ICD-10-CM | POA: Diagnosis not present

## 2020-01-15 DIAGNOSIS — Z923 Personal history of irradiation: Secondary | ICD-10-CM | POA: Diagnosis not present

## 2020-01-15 DIAGNOSIS — Z1509 Genetic susceptibility to other malignant neoplasm: Secondary | ICD-10-CM

## 2020-01-15 DIAGNOSIS — Z8542 Personal history of malignant neoplasm of other parts of uterus: Secondary | ICD-10-CM | POA: Diagnosis not present

## 2020-01-15 DIAGNOSIS — Z933 Colostomy status: Secondary | ICD-10-CM | POA: Diagnosis not present

## 2020-01-15 NOTE — Patient Instructions (Signed)
Please notify Dr Denman [name] at phone number [phone] if you notice vaginal bleeding, new pelvic or abdominal pains, bloating, feeling full easy, or a change in bladder or bowel function.   Please contact Dr Serita Grit office (at [phone]) in January to request an appointment with her for June, 2022, or sooner if you have new gynecologic concerns.  Dr Denman [name] recommends using a short tampon (eg brand "OB") in the vagina to absorb the drainage from the fistula tract so that it doesn't pass onto the labia and bottom.

## 2020-01-15 NOTE — Progress Notes (Signed)
Recurrent Endometrial cancer FOLLOWUP VISIT  Assessment:    64 y.o. year old with history of recurrent endometrioid endometrial/ovarian cancer.   S/p exploratory laparotomy, posterior supralevator pelvic exenteration, end colostomy, bladder repair, ureteral stenting with complete resection and negative margins on 11/09/14. S/p adjuvant radiation completed 03/10/15 Disease free since this time.  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  Status post pelvic exenteration procedure at [facility] in September 2020 with proctectomy and cystectomy and formation of permanent colostomy and ileal conduit.  Vaginectomy not performed.  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 No specific follow-up or surveillance recommended by genetics counselor.  Plan: 1) History of recurrent endometrial/ovarian cancer: s/p complete resection with negative margins and s/p adjuvant radiation. No chemotherapy (in the recurrent setting) due to chronic idiopathic neutropenia. She has a history of primary adjuvant chemotherapy after her initial diagnosis of stage III endometrial cancer in 2006. CT imaging and extensive pelvic surgical evaluation in 2020 demonstrated complete clinical response.  Will continue annual surveillance exams until 2026. I explained that there isn't a role for routine screening CT scans.  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  Has persistent vaginal drainage despite cystectomy and total proctectomy. I explained to [name] that realistically she will likely always live with some element of vaginal drainage.  The treatment for this would require total  vaginectomy and placement of myocutaneous flap.  The ideal opportunity to of perform this would have been during her exenteration procedure in September 2020.  However it was elected to not have this performed during the procedure by her surgeons.  Therefore she is likely lost the opportunity to undergo such a procedure as reembarking on another major surgery for this would almost certainly involve having her colostomy and ileal conduit having to be taken down and reformed and she would likely have significant convalescence and complications.  Additionally there would be no guarantee that her tissues, severely damaged by radiation, would heal such as a second surgery and result in good outcomes.  I counseled her regarding use of a small size tampon to attempt to control the drainage without use of an external pad.  3) neutropenia - Dr Alvy Bimler is managing this and we appreciate.  4) breast cancer - stage IIA. ER/PR positive. Treated with surgery and arimidex.  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 Patient follows-up with Dr Cristina Gong for close colon cancer and upper GI cancer surveillance  6) Follow-up: I will see Chace for followup in 1 year  HPI:  [name] is a 64 y.o. 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  She has a history of endometrial and ovarian endometrioid carcinoma treated in 2006 by Dr. [name] at [facility] in [place]. Her surgery (TAH, BSO) was followed by adjuvant chemotherapy with carboplatin plus paclitaxel due to the ovarian involvement and the presence of a cul de sac lesion also positive for disease. She denies receiving adjuvant radiation therapy. She is unclear if she had metastatic endometrial cancer to the ovary or duel primaries. She had a complete response to therapy however developed leukopenia in July 2010. After  extensive workup which included bone marrow biopsy,  she was determined to have chronic idiopathic neutropenia presumed related to previous chemotherapy. She sees Dr. Alvy Bimler for this and is treated with G-CSF injections.  She began experiencing rectal pain approximately in January 2016. She also reports narrowing of caliber of the stool. She denies hematochezia. She does report approximately 3 months of vaginal spotting. She's had no specific follow-up for her gynecologic cancers in the past 4 years.  As part of workup of her rectal pain she underwent a CT scan of the abdomen and pelvis on 09/12/2014. This demonstrated a new right perirectal mass abutting the vaginal cuff measuring 3.8 x 4.9 cm. There is a limited fat plane between the mass and the rectum posteriorly. Rectal invasion could not be excluded. There were no other masses identified in the abdomen and pelvis or lymphadenopathy. There is no other evidence of metastatic disease or recurrent disease. There was no hydronephrosis. A CA-125 drawn on 09/12/2014 was normal at 10.  PET was negative for extrapelvic disease. MRI defined the lesion as a 3.5x5cm lesion to the right of the rectum at the vaginal cuff.  Colonoscopy was performed on 10/02/14 with transrectal [place] and biopsy and this revealed endometrioid adenocarcinoma. Of note, the lesion was not seen within the lumen of the rectum.   She then underwent a posterior supralevator exenteration with colostomy and bladder repair and stent placement on 1/61/09 without complications with Dr Denman [name] and Dr Johney Maine.  Her postoperative course was uncomplicate with the exception of development of postop anemia.  Her final pathology revealed endometrioid adenocarcinoma invading the vagina and rectum with negative margins on the specimen. The margin had been close (clinically) to the right pelvic sidewall which was marked with surgical clips.  On POD 13 she was readmitted with fever, and peristomal cellulitis  from what was determined to be a stomal fistula. It was treated with IV antibiotics and then on POD 15 she was taken to the OR for laparoscopic revision of the stoma with Dr Michael [name]. Postoperatively she had wound vac and packing for her stomal wound.  A retrograde cystogram on week 4 postop confirmed an intact bladder and the foley was removed.  She initially had some voiding issues with decreased sensation to void. We tested a post void residual in May, 2016 and this revealed adequate voiding.  On 12/30/14 she was admitted to [facility] with sepsis associated with Enterobacter Cloacae UTI. This was treated with IV antibiotics and then a prolonged course of oral cipro. Imaging performed at the time of admission (a CT of the abdo/pelvis) revealed: Bilateral double-J internal ureteral stents in adequate position with persistent mild bilateral hydronephrosis   She complete radiation therapy from 01/29/15 to 03/10/15 with 50Gy of external beam radiation and IMRT. She tolerated therapy well with minor skin irritation.  She had her ureteral stents removed in June, 2016, however, then developed right ureteral obstruction, hydroureter and pain. She went to the OR with Urologist Dr Tresa Moore on 03/20/15 for ureteral dilation and right stent placement (the left was draining well). No tumor was seen on cysto.   CT abdo/pelvis on 05/14/15 showed: no new lesions, stable thickening in right pelvis/distal right ureter consistent with radiation effect. The right ureteral stent was removed.  The patient's CT in January 2017 showed progression of her right ureteral obstruction after stent removal. Dr Tresa Moore took her to the OR on 09/05/15 for a cystoscopy, retrograde pyelogram and right ureteral stent placement.   The CT on 08/18/15 showed decreased attenuation of the  right renal parenchyma, right hydronephrosis that was severe no excretion of contrast on that for graphic phase imaging. The perivascular soft tissue  thickening in the pelvis and presacral regions grossly stable consistent with radiation changes.  The patient was diagnosed with ER/PR positive left breast cancer in April 2017. Surgery is scheduled for May. She underwent bilateral mastectomy with expander reconstruction. Postoperatively she was treated with Arimidex.   Repeat CT imaging on 02/08/16 revealed : Stable post treatment changes in presacral region. No evidence of recurrent or metastatic carcinoma within the abdomen or pelvis. Interval placement of right ureteral stent in appropriate position. Moderate right hydroureteronephrosis remains stable. Stable benign left adrenal adenoma and hepatic steatosis.  On 06/30/16 she had replacement of her right ureteral stent with Dr Tresa Moore.  On 10/07/16 CT surveillance was performed which showed: Stable exam.  No new or progressive findings. Stable appearance abnormal presacral soft tissue compatible with post treatment change. Internal right ureteral stent with persistent right hydroureteronephrosis. Differentially decreased perfusion of the right kidney suggests a component of underlying obstructive uropathy. Stable 13 mm left adrenal nodule, previously characterized as adenoma. A parastomal hernia was noted.  CT 01/27/17 showed stable findings with no evidence of recurrence. Persistent right hydroureter was again noted.  Her colostomy bothered her greatly and she hoped to be free of stomal appliances. She was frustrated with repetitive stent replacements and desired definitive reconstruction/reimplantation of the right ureter.    Given that a 2 year disease free interval had elapsed, in 04/01/17 Dr Tresa Moore and Dr Johney Maine returned to the operating room with Ms Troiani for a right ureteral resection, and bladder hitch with reimplantation and reversal of hartman's with temporary ileostomy complicated by postop development of urinoma which required a peritoneal drain and right ureteral stent.  She subsequently  developed a colovesicular fistula which caused breakdown of the skin on her buttocks from chronic maceration and immobility due to her convalescence. She was offered permanent diversion with ileal conduit and permanent end colostomy. This was not considered a reasonable option at that time by [facility]. She became quite depressed by the slow recovery and sequelae of her last surgery. She sought second opinion with Gynecologic Oncology at [facility]. They offered referral to urology and colorectal specialists at [facility]. They recommended no additional cancer treatment as she was disease free. She is being worked up by them for possible laparotomy, revision of right uretero-neocystotomy, placement of VRAM flap, and possible bowel resection and revision of ileostomy. Prior to embarking on this surgery they requested she lose weight to a BMI of <30kg/m2.  She had lost weight to a BMI <30 throughout 2020. She experienced some episodes of passage of bloody mucous from the vagina in May, 2020 and so an CT scan was performed which showed no signs of pelvic recurrence.   In September the patient embarked on a large procedure at [facility].  She underwent an exploratory laparotomy panniculectomy cystectomy creation of urinary conduit with ileum, proctectomy, and formation of permanent and colostomy.  The procedure was uncomplicated.  She reported that while plastic surgery had planned to take part in the procedure with placement of a flap, it was determined that this was not necessary intraoperatively.  While the procedure note in Care Everywhere states that a hysterectomy was performed, this had already been performed more than a decade prior.  Additionally there was no myocutaneous flap placed despite the procedure reporting as such.  No malignancy was identified in the specimens.  The patient had  an uncomplicated procedure overall and a 7-day hospital stay with subsequent rehab.  She developed some wound  healing issues at her colostomy site.  Interval Hx:   She continues to have persistent vaginal drainage which she had hoped would go away permanently after the procedure.  She was treated with an oral antibiotic recently by Dr Cristina Gong to control gas production in her stoma. It was beneficial for this but also for her vaginal drainage. After she stopped the course of antibiotic the discharge picked up again. At her last visit I had counseled her about usage of a tampon to absorb the drainage. She elected to not do this because she was worried about damaging something or stopping the drainage from coming out and causing infection.  Review of systems: Constitutional:  She has no weight gain or weight loss. She has a low grade fever no chills. Eyes: No blurred vision Ears, Nose, Mouth, Throat: No dizziness, headaches or changes in hearing. No mouth sores. Cardiovascular: No chest pain, palpitations or edema. Respiratory:  No shortness of breath, wheezing  Gastrointestinal: She has normal bowel movements trhough stoma without diarrhea or constipation. She denies any nausea or vomiting. She denies blood in her stool or heart burn. Genitourinary:  See HPI Musculoskeletal: Denies muscle weakness or joint pains.  Skin:  She has no skin changes, rashes or itching Neurological:  Denies dizziness or headaches. No neuropathy, no numbness or tingling. Psychiatric:  She denies depression or anxiety. Hematologic/Lymphatic:   No easy bruising or bleeding  Allergies  Allergen Reactions  . Penicillins Swelling    Facial swelling/childhood allergy Has patient had a PCN reaction causing immediate rash, facial/tongue/throat swelling, SOB or lightheadedness with hypotension: Yes Has patient had a PCN reaction causing severe rash involving mucus membranes or skin necrosis: Yes Has patient had a PCN reaction that required hospitalization yes Has patient had a PCN reaction occurring within the last 10 years:  No If all of the above answers are "NO", then may proceed with Cephalosporin use.   . Cefaclor Rash    Ceclor  . Erythromycin Other (See Comments)    Gastritis, abd cramps  . Tape Rash    blisters  . Trimethoprim Rash  . Ultram [Tramadol] Hives  . Cephalosporins Rash  . Fluconazole Rash  . Oxycodone Other (See Comments)    " I just feel weird"  . Pectin Rash    Pectin ring for stoma  . Septra [Sulfamethoxazole-Trimethoprim] Rash  . Sulfa Antibiotics Rash    Current Outpatient Medications on File Prior to Visit  Medication Sig Dispense Refill  . acetaminophen (TYLENOL) 325 MG tablet Take 2 tablets (650 mg total) by mouth every 6 (six) hours as needed. 30 tablet 1  . albuterol (PROVENTIL HFA) 108 (90 Base) MCG/ACT inhaler Inhale 1 puff into the lungs every 6 (six) hours as needed for wheezing or shortness of breath. 18 g 2  . anastrozole (ARIMIDEX) 1 MG tablet TAKE 1 TABLET DAILY 90 tablet 3  . Biotin 5 MG TABS Take 5 mg by mouth daily.     . calcium citrate-vitamin D (CITRACAL+D) 315-200 MG-UNIT tablet Take 1 tablet by mouth daily.    . Cholecalciferol (VITAMIN D3) 10000 UNITS capsule Take 10,000 Units by mouth every Sunday.     . clobetasol (OLUX) 0.05 % topical foam Apply topically 2 (two) times daily.    . diphenhydrAMINE (BENADRYL) 25 mg capsule Take 1 capsule (25 mg total) by mouth every 8 (eight) hours as needed for  itching, allergies or sleep. 30 capsule 0  . ferrous sulfate 325 (65 FE) MG tablet Take 1 tablet (325 mg total) by mouth at bedtime. 30 tablet 3  . filgrastim-aafi (NIVESTYM) 480 MCG/0.8ML SOSY injection 480 mcg subcutaneous injection every 6 days, for life 11.2 mL 11  . levothyroxine (SYNTHROID, LEVOTHROID) 150 MCG tablet Take 1 tablet (150 mcg total) by mouth daily before breakfast. 30 tablet 1  . loratadine (CLARITIN) 10 MG tablet Take 10 mg by mouth daily.     . Melatonin 10 MG TABS Take 1 tablet by mouth at bedtime as needed.    [name] Kitchen omega-3 acid ethyl esters  (LOVAZA) 1 G capsule Take 1 g by mouth 2 (two) times daily.     Vladimir Faster Glycol-Propyl Glycol (SYSTANE OP) Place 1 drop into both eyes daily as needed (dry eyes).     . pregabalin (LYRICA) 50 MG capsule Take 1 capsule (50 mg total) by mouth 2 (two) times daily. 180 capsule 1  . Prenatal Vit-Fe Fumarate-FA (PRENATAL VITAMIN PO) Take 1 capsule by mouth daily. Takes prenatal because there are no dyes in it    . Probiotic Product (ALIGN PO) Take by mouth 2 (two) times daily.    . rosuvastatin (CRESTOR) 10 MG tablet Take 10 mg by mouth every evening.     . Saccharomyces boulardii (FLORASTOR PO) Take 1 capsule by mouth daily.    . Semaglutide (RYBELSUS) 3 MG TABS Take 1 tablet by mouth daily. 90 tablet 0  . Tbo-Filgrastim (GRANIX) 480 MCG/0.8ML SOSY injection 480 mcg subcutaneous injection every 6 days life long 11.2 mL 11   No current facility-administered medications on file prior to visit.    Past Medical History:  Diagnosis Date  . Adrenal adenoma, left 02/08/2016   CT: stable benign  . Anemia in neoplastic disease   . Back pain   . Benign essential HTN   . 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 Cancer of corpus uteri, except isthmus [facility])  oncologist-- dr Denman [name] and dr Alvy Bimler    10-15-2004  dx endometroid endometrial and ovarian cancer s/p  chemotheapy and surgery(TAH w/ BSO) :  recurrent 11-19-2014 post pelvic surgery and radiation 01-29-2015 to 03-10-2015  . Chronic idiopathic neutropenia (HCC)    presumed related to chemotherapy March 2006--- followed by dr Alvy Bimler (treatment w/ G-CSF injections  . Chronic nausea   . Chronic pain    perineal/ anal  area from bladder pad irritates skin , right flank pain  . CKD stage G2/A3, GFR 60-89 and albumin creatinine ratio >300 mg/g    nephrologist-  dr  Madelon Lips  . Colovesical fistula   . Diabetic retinopathy, background ([place])   . Difficult intravenous access    small veins--- hx PICC lines  . DM type 2 (diabetes mellitus, type 2) ([place])    monitored by dr Legrand Como altheimer  . Dysuria   . Environmental and seasonal allergies   . Fatty liver 02/08/2016   CT  . Generalized muscle weakness   . GERD (gastroesophageal reflux disease)   . Hiatal hernia   . History of abdominal abscess 04/16/2017   post surgery 04-01-2017  --- resolved 10/ 2018  . History of gastric polyp    2014  duodenum  . History of ileus 04/16/2017   resolved w/ no  surgical intervention  . History of radiation therapy    01-29-2015 to 03-10-2015  pelvis 50.4Gy  . Hypothyroidism    monitored by dr Legrand Como altheimer  . IBS (irritable bowel syndrome)   . Ileostomy in place [facility]) 04/01/2017   created at same time colostomy takedown.  . Joint pain   . Leg edema   . Lower urinary tract symptoms (LUTS)    urge urinary  incontinence  . Mixed dyslipidemia   . Multiple thyroid nodules    Managed by Dr. Harlow Asa  . Nephrostomy status ([place])   . Palpitations   . Pelvic abscess in female 04/16/2017  . PONV (postoperative nausea and vomiting)    "scopolamine patch works for me"  . Radiation-induced dermatitis    contact dermatitis , radiation completed, rash only on ankles now.  . SBO (small bowel obstruction) ([place]) 01/2019  . Seasonal allergies   . Ureteral stricture, right UROLOGIT-  DR [facility]   CHRONIC--  TREATMENT URETERAL STENT  . 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 Vitamin D deficiency   . Wears glasses     Past Surgical History:  Procedure Laterality Date  . APPENDECTOMY    . biopsy thyroid nodules    . BREAST RECONSTRUCTION WITH PLACEMENT OF TISSUE EXPANDER AND FLEX HD (ACELLULAR HYDRATED DERMIS) Bilateral 12/11/2015   Procedure: BILATERAL BREAST RECONSTRUCTION WITH PLACEMENT OF TISSUE EXPANDERS;  Surgeon: Irene Limbo, MD;  Location: [place];  Service: Plastics;  Laterality: Bilateral;  . COLONOSCOPY WITH PROPOFOL N/A 08/21/2013   Procedure: COLONOSCOPY WITH PROPOFOL;  Surgeon: Cleotis Nipper, MD;  Location: [facility];  Service: Endoscopy;  Laterality: N/A;  . COLOSTOMY TAKEDOWN N/A 12/04/2014   Procedure: LAPROSCOPIC LYSIS OF ADHESIONS, SPLENIC MOBILIZATION, RELOCATION OF COLOSTOMY, DEBRIDEMENT INITIAL COLOSTOMY SITE;  Surgeon: Michael [name], MD;  Location: [facility];  Service: General;  Laterality: N/A;  . CYSTOGRAM N/A 06/01/2017   Procedure: CYSTOGRAM;  Surgeon: Alexis Frock, MD;  Location: [facility];  Service: Urology;  Laterality: N/A;  . CYSTOSCOPY W/ RETROGRADES Right 11/21/2015   Procedure: CYSTOSCOPY WITH RETROGRADE PYELOGRAM;  Surgeon: Alexis Frock, MD;  Location: [facility];  Service: Urology;  Laterality: Right;  . CYSTOSCOPY W/ URETERAL STENT PLACEMENT Right 11/21/2015   Procedure: CYSTOSCOPY WITH STENT REPLACEMENT;  Surgeon: Alexis Frock, MD;  Location: [facility];  Service: Urology;  Laterality: Right;  . CYSTOSCOPY W/ URETERAL STENT PLACEMENT Right 03/10/2016   Procedure: CYSTOSCOPY WITH STENT REPLACEMENT;  Surgeon: Alexis Frock, MD;  [facility];  Service: Urology;  Laterality: Right;  . CYSTOSCOPY W/ URETERAL STENT PLACEMENT Right 06/30/2016   Procedure: CYSTOSCOPY WITH RETROGRADE PYELOGRAM/URETERAL STENT EXCHANGE;  Surgeon: Alexis Frock, MD;  Location: [facility];  Service: Urology;  Laterality: Right;  . CYSTOSCOPY W/ URETERAL STENT PLACEMENT N/A 06/01/2017   Procedure: CYSTOSCOPY WITH EXAM UNDER ANESTHESIA;  Surgeon: Alexis Frock, MD;  Location: [facility];  Service: Urology;  Laterality: N/A;  . CYSTOSCOPY W/ URETERAL STENT PLACEMENT Right 08/17/2017   Procedure: CYSTOSCOPY WITH RETROGRADE PYELOGRAM/URETERAL STENT REMOVAL;  Surgeon: Alexis Frock, MD;  Location: [facility];  Service: Urology;  Laterality: Right;  . CYSTOSCOPY WITH RETROGRADE PYELOGRAM,  URETEROSCOPY AND STENT PLACEMENT Right 03/20/2015   Procedure: CYSTOSCOPY WITH RETROGRADE PYELOGRAM, URETEROSCOPY WITH BALLOON DILATION AND STENT PLACEMENT ON RIGHT;  Surgeon: Alexis Frock, MD;  Location: [facility];  Service: Urology;  Laterality: Right;  . CYSTOSCOPY WITH RETROGRADE PYELOGRAM, URETEROSCOPY AND STENT PLACEMENT Right 05/02/2015   Procedure: CYSTOSCOPY  WITH RIGHT RETROGRADE PYELOGRAM,  DIAGNOSTIC URETEROSCOPY AND STENT PULL ;  Surgeon: Alexis Frock, MD;  Location: [facility];  Service: Urology;  Laterality: Right;  . CYSTOSCOPY WITH RETROGRADE PYELOGRAM, URETEROSCOPY AND STENT PLACEMENT Right 09/05/2015   Procedure: CYSTOSCOPY WITH RETROGRADE PYELOGRAM,  AND STENT PLACEMENT;  Surgeon: Alexis Frock, MD;  [facility];  Service: Urology;  Laterality: Right;  . CYSTOSCOPY WITH RETROGRADE PYELOGRAM, URETEROSCOPY AND STENT PLACEMENT Right 04/01/2017   Procedure: CYSTOSCOPY WITH RETROGRADE PYELOGRAM, URETEROSCOPY AND STENT PLACEMENT;  Surgeon: Alexis Frock, MD;  [facility];  Service: Urology;  Laterality: Right;  . CYSTOSCOPY WITH STENT PLACEMENT Right 10/27/2016   Procedure: CYSTOSCOPY WITH STENT CHANGE and right retrograde pyelogram;  Surgeon: Alexis Frock, MD;  Location[facility];  Service: Urology;  Laterality: Right;  . EUS N/A 10/02/2014   Procedure: LOWER ENDOSCOPIC ULTRASOUND (EUS);  Surgeon: Arta Silence, MD;  Location: [facility];  Service: Endoscopy;  Laterality: N/A;  . EXCISION SOFT TISSUE MASS RIGHT FOREMAN  12-08-2006  . EYE SURGERY  as child   pytosis of eyelids repair  . INCISION AND DRAINAGE OF WOUND Bilateral 12/26/2015   Procedure: DEBRIDEMENT OF BILATERAL MASTECTOMY FLAPS;  Surgeon: Irene Limbo, MD;  Location: [place];  Service: Plastics;  Laterality: Bilateral;  . IR CV LINE INJECTION  05/31/2017  . IR FLUORO GUIDE CV LINE LEFT  05/31/2017  . IR FLUORO GUIDE CV LINE RIGHT   04/06/2017  . IR FLUORO GUIDE CV MIDLINE PICC RIGHT  05/30/2017  . IR NEPHROSTOGRAM LEFT INITIAL PLACEMENT  09/02/2017  . IR NEPHROSTOGRAM LEFT THRU EXISTING ACCESS  11/29/2017  . IR NEPHROSTOGRAM RIGHT INITIAL PLACEMENT  09/02/2017  . IR NEPHROSTOGRAM RIGHT THRU EXISTING ACCESS  09/13/2017  . IR NEPHROSTOGRAM RIGHT THRU EXISTING ACCESS  11/29/2017  . IR NEPHROSTOMY EXCHANGE LEFT  11/28/2017  . IR NEPHROSTOMY EXCHANGE LEFT  01/05/2018  . IR NEPHROSTOMY EXCHANGE LEFT  02/16/2018  . IR NEPHROSTOMY EXCHANGE LEFT  03/30/2018  . IR NEPHROSTOMY EXCHANGE LEFT  05/12/2018  . IR NEPHROSTOMY EXCHANGE LEFT  06/21/2018  . IR NEPHROSTOMY EXCHANGE LEFT  08/04/2018  . IR NEPHROSTOMY EXCHANGE LEFT  09/18/2018  . IR NEPHROSTOMY EXCHANGE LEFT  10/09/2018  . IR NEPHROSTOMY EXCHANGE LEFT  10/27/2018  . IR NEPHROSTOMY EXCHANGE LEFT  11/21/2018  . IR NEPHROSTOMY EXCHANGE LEFT  01/05/2019  . IR NEPHROSTOMY EXCHANGE LEFT  02/15/2019  . IR NEPHROSTOMY EXCHANGE LEFT  03/29/2019  . IR NEPHROSTOMY EXCHANGE RIGHT  10/02/2017  . IR NEPHROSTOMY EXCHANGE RIGHT  11/28/2017  . IR NEPHROSTOMY EXCHANGE RIGHT  01/05/2018  . IR NEPHROSTOMY EXCHANGE RIGHT  02/16/2018  . IR NEPHROSTOMY EXCHANGE RIGHT  03/30/2018  . IR NEPHROSTOMY EXCHANGE RIGHT  05/12/2018  . IR NEPHROSTOMY EXCHANGE RIGHT  06/21/2018  . IR NEPHROSTOMY EXCHANGE RIGHT  08/04/2018  . IR NEPHROSTOMY EXCHANGE RIGHT  09/18/2018  . IR NEPHROSTOMY EXCHANGE RIGHT  10/27/2018  . IR NEPHROSTOMY EXCHANGE RIGHT  11/21/2018  . IR NEPHROSTOMY EXCHANGE RIGHT  01/05/2019  . IR NEPHROSTOMY EXCHANGE RIGHT  02/15/2019  . IR NEPHROSTOMY EXCHANGE RIGHT  03/29/2019  . IR NEPHROSTOMY PLACEMENT LEFT  10/02/2017  . IR RADIOLOGIST EVAL & MGMT  05/03/2017  . IR US GUIDE VASC ACCESS LEFT  05/31/2017  . IR US GUIDE VASC ACCESS RIGHT  04/06/2017  . IR US GUIDE VASC ACCESS RIGHT  05/30/2017  . LAPAROSCOPIC CHOLECYSTECTOMY  1990  . LIPOSUCTION WITH LIPOFILLING Bilateral 04/16/2016   Procedure: LIPOSUCTION  WITH LIPOFILLING TO  BILATERAL CHEST;  Surgeon: Irene Limbo, MD;  Location: [place];  Service: Plastics;  Laterality: Bilateral;  . MASTECTOMY W/ SENTINEL NODE BIOPSY Bilateral 12/11/2015   Procedure: RIGHT PROPHYLACTIC MASTECTOMY, LEFT TOTAL MASTECTOMY WITH LEFT AXILLARY SENTINEL LYMPH NODE BIOPSY;  Surgeon: Stark Klein, MD;  Location: [place];  Service: General;  Laterality: Bilateral;  . OSTOMY N/A 11/19/2014   Procedure: OSTOMY;  Surgeon: Michael [name], MD;  Location: [facility];  Service: General;  Laterality: N/A;  . PROCTOSCOPY N/A 04/01/2017   Procedure: RIDGE PROCTOSCOPY;  Surgeon: Michael [name], MD;  Location: [facility];  Service: General;  Laterality: N/A;  . REMOVAL OF BILATERAL TISSUE EXPANDERS WITH PLACEMENT OF BILATERAL BREAST IMPLANTS Bilateral 04/16/2016   Procedure: REMOVAL OF BILATERAL TISSUE EXPANDERS WITH PLACEMENT OF BILATERAL BREAST IMPLANTS;  Surgeon: Irene Limbo, MD;  Location: [place];  Service: Plastics;  Laterality: Bilateral;  . ROBOTIC ASSISTED LAP VAGINAL HYSTERECTOMY N/A 11/19/2014   Procedure: ROBOTIC LYSIS OF ADHESIONS, CONVERTED TO LAPAROTOMY RADICAL UPPER VAGINECTOMY,LOW ANTERIOR BOWEL RESECTION, COLOSTOMY, BILATERAL URETERAL STENT PLACEMENT AND CYSTONOMY CLOSURE;  Surgeon: Everitt Amber, MD;  Location: [facility];  Service: Gynecology;  Laterality: N/A;  . TISSUE EXPANDER FILLING Bilateral 12/26/2015   Procedure: EXPANSION OF BILATERAL CHEST TISSUE EXPANDERS (60 mL- Right; 75 mL- Left);  Surgeon: Irene Limbo, MD;  Location: [place];  Service: Plastics;  Laterality: Bilateral;  . TONSILLECTOMY    . TOTAL ABDOMINAL HYSTERECTOMY  March 2006   [facility]   and Bilateral Salpingoophorectomy/  staging for Ovarian cancer/  an  . XI ROBOTIC ASSISTED LOWER ANTERIOR RESECTION N/A 04/01/2017   Procedure: XI ROBOTIC VS LAPAROSCOPIC COLOSTOMY TAKEDOWN WITH LYSIS OF ADHESIONS.;  Surgeon: Michael [name], MD;  Location: [facility];  Service: General;   Laterality: N/A;  ERAS PATHWAY    Family History  Problem Relation Age of Onset  . Cancer Mother 26       stomach ca  . Hypertension Mother   . Cancer Father 12       prostate ca  . Diabetes Father   . Heart disease Father        CABG  . Hypertension Father   . Hyperlipidemia Father   . Obesity Father   . Breast cancer Maternal Aunt        dx in her 29s  . Lymphoma Paternal Aunt   . Brain cancer Paternal Grandfather   . Ovarian cancer Other   . Diabetes Sister   . Hypertension Brother y-10  . Heart disease Brother        CABG  . Diabetes Brother     Social History   Socioeconomic History  . Marital status: Married    Spouse name: Nillie Bartolotta  . Number of children: 1  . Years of education: Not on file  . Highest education level: Not on file  Occupational History  . 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 Smoking status: Never Smoker  . Smokeless tobacco: Never Used  Vaping Use  . Vaping Use: Never used  Substance and Sexual Activity  . Alcohol use: Not Currently  . Drug use: No  . Sexual activity: Not on file  Other Topics Concern  . Not on file  Social History Narrative   Exercise-- has not gotten back into it since cancer came back   Social Determinants of Health   Financial Resource Strain:   .  Difficulty of Paying Living Expenses:   Food Insecurity:   . Worried About Charity fundraiser in the Last Year:   . Arboriculturist in the Last Year:   Transportation Needs:   . Film/video editor (Medical):   [name] Kitchen Lack of Transportation (Non-Medical):   Physical Activity:   . Days of Exercise per Week:   . Minutes of Exercise per Session:   Stress:   . Feeling of Stress :   Social Connections:   . Frequency of Communication with Friends and Family:   . Frequency of Social Gatherings with Friends and Family:   . Attends Religious Services:   . Active Member of Clubs or Organizations:   . Attends Theatre manager Meetings:   [name] Kitchen Marital Status:   Intimate Partner Violence:   . Fear of Current or Ex-Partner:   . Emotionally Abused:   [name] Kitchen Physically Abused:   . Sexually Abused:    CBC    Component Value Date/Time   WBC 3.3 (L) 10/02/2019 1158   RBC 3.99 10/02/2019 1158   HGB 12.4 10/02/2019 1158   HGB 10.2 (L) 03/27/2018 1324   HGB 12.4 07/29/2017 1444   HCT 39.0 10/02/2019 1158   HCT 38.0 07/29/2017 1444   PLT 214 10/02/2019 1158   PLT 268 03/27/2018 1324   PLT 260 07/29/2017 1444   MCV 97.7 10/02/2019 1158   MCV 90.9 07/29/2017 1444   MCH 31.1 10/02/2019 1158   MCHC 31.8 10/02/2019 1158   RDW 14.1 10/02/2019 1158   RDW 15.5 (H) 07/29/2017 1444   LYMPHSABS 0.8 10/02/2019 1158   LYMPHSABS 0.8 (L) 07/29/2017 1444   MONOABS 0.8 10/02/2019 1158   MONOABS 1.0 (H) 07/29/2017 1444   EOSABS 0.2 10/02/2019 1158   EOSABS 0.0 07/29/2017 1444   BASOSABS 0.0 10/02/2019 1158   BASOSABS 0.0 07/29/2017 1444     Physical Exam: There were no vitals taken for this visit. General: Well dressed, well nourished in no apparent distress.   HEENT:  Normocephalic and atraumatic, no lesions.  Extraocular muscles intact. Sclerae anicteric. Pupils equal, round, reactive. No mouth sores or ulcers. Thyroid is normal size, not nodular, midline. Skin:  No lesions or rashes. Breasts:  deferred Lungs:  Clear to auscultation bilaterally.  No wheezes. Cardiovascular:  Regular rate and rhythm.  No murmurs or rubs. Abdomen:   Soft, somewhat tender, nondistended.  No palpable masses.  No hepatosplenomegaly.  No ascites. Normal bowel sounds.  No hernias.  Ostomies with opaque appliances.  Genitourinary: vaginal cuff intact without blood or fluid in the vault. There is drainage of thin watery pink tinged fluid from the vaginal cuff. Severe radiation fibrosis with indurated tissues. Normal mucosa. No palpable masses in pelvis. There is no palpable fistulous tract. Rectal exam: deferred (oversewn). Extremities:  No cyanosis, clubbing or edema.  No calf tenderness or erythema. No palpable cords. Psychiatric: Mood and affect are appropriate. Neurological: Awake, alert and oriented x 3. Sensation is intact, no neuropathy.  Musculoskeletal: No pain, normal strength and range of motion.  Thereasa Solo, MD  CC: Dr Tresa Moore, Dr Alvy Bimler, Dr Theressa Millard, Dr Madelon Lips

## 2020-01-16 DIAGNOSIS — Z933 Colostomy status: Secondary | ICD-10-CM | POA: Diagnosis not present

## 2020-01-16 DIAGNOSIS — L89319 Pressure ulcer of right buttock, unspecified stage: Secondary | ICD-10-CM | POA: Diagnosis not present

## 2020-01-17 NOTE — Progress Notes (Signed)
Cardiology Office Note:    Date:  01/25/2020   ID:  [name], DOB Jun 06, 1956, MRN [id number]  PCP:  Ann Held, DO  Cardiologist:  Sinclair Grooms, MD   Referring MD: Carollee Herter, Alferd Apa, *   No chief complaint on file.   History of Present Illness:    [name] is a 64 y.o. female with a hx of DOE, PAF (1% burden), hyperlipidemia, DM II,  Essential hypertension, CKD,   He feels much better.  No significant episodes of tachycardia or palpitation.  She correlates improvement in her condition with switching from Zarxio to Granix.  Insurance company is giving her a difficult time about continuing on Granix.  No neurological complaints.  Past Medical History:  Diagnosis Date  . Adrenal adenoma, left 02/08/2016   CT: stable benign  . Anemia in neoplastic disease   . Back pain   . Benign essential HTN   . 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 Cancer of corpus uteri, except isthmus [facility])  oncologist-- dr Denman [name] and dr Alvy Bimler    10-15-2004  dx endometroid endometrial and ovarian cancer s/p  chemotheapy and surgery(TAH w/ BSO) :  recurrent 11-19-2014 post pelvic surgery and radiation 01-29-2015 to 03-10-2015  . Chronic idiopathic neutropenia (HCC)    presumed related to chemotherapy March 2006--- followed by dr Alvy Bimler (treatment w/ G-CSF injections  . Chronic nausea   . Chronic pain    perineal/ anal  area from bladder pad irritates skin , right flank pain  . CKD stage G2/A3, GFR 60-89 and albumin creatinine ratio >300 mg/g    nephrologist-  dr Madelon Lips  . Colovesical fistula   . Diabetic retinopathy, background ([place])   . Difficult intravenous access    small veins--- hx PICC lines  . DM type 2 (diabetes mellitus, type 2) ([place])    monitored by dr  Legrand Como altheimer  . Dysuria   . Environmental and seasonal allergies   . Fatty liver 02/08/2016   CT  . Generalized muscle weakness   . GERD (gastroesophageal reflux disease)   . Hiatal hernia   . History of abdominal abscess 04/16/2017   post surgery 04-01-2017  --- resolved 10/ 2018  . History of gastric polyp    2014  duodenum  . History of ileus 04/16/2017   resolved w/ no surgical intervention  . History of radiation therapy    01-29-2015 to 03-10-2015  pelvis 50.4Gy  . Hypothyroidism    monitored by dr Legrand Como altheimer  . IBS (irritable bowel syndrome)   . Ileostomy in place [facility]) 04/01/2017   created at same time colostomy takedown.  . Joint pain   . Leg edema   . Lower urinary tract symptoms (LUTS)    urge urinary  incontinence  . Mixed dyslipidemia   . Multiple thyroid nodules    Managed by Dr. Harlow Asa  . Nephrostomy status ([place])   . Palpitations   . Pelvic abscess in female 04/16/2017  . PONV (postoperative nausea and vomiting)    "scopolamine patch works for me"  . Radiation-induced dermatitis    contact dermatitis , radiation completed, rash only on ankles now.  . SBO (small bowel obstruction) ([place]) 01/2019  . Seasonal allergies   . Ureteral stricture, right UROLOGIT-  DR [facility]  CHRONIC--  TREATMENT URETERAL STENT  . 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 Vitamin D deficiency   . Wears glasses     Past Surgical History:  Procedure Laterality Date  . APPENDECTOMY    . biopsy thyroid nodules    . BREAST RECONSTRUCTION WITH PLACEMENT OF TISSUE EXPANDER AND FLEX HD (ACELLULAR HYDRATED DERMIS) Bilateral 12/11/2015   Procedure: BILATERAL BREAST RECONSTRUCTION WITH PLACEMENT OF TISSUE EXPANDERS;  Surgeon: Irene Limbo, MD;  Location: [place];  Service: Plastics;  Laterality: Bilateral;  . COLONOSCOPY WITH PROPOFOL N/A 08/21/2013   Procedure: COLONOSCOPY WITH PROPOFOL;  Surgeon: Cleotis Nipper, MD;  [facility];  Service: Endoscopy;   Laterality: N/A;  . COLOSTOMY TAKEDOWN N/A 12/04/2014   Procedure: LAPROSCOPIC LYSIS OF ADHESIONS, SPLENIC MOBILIZATION, RELOCATION OF COLOSTOMY, DEBRIDEMENT INITIAL COLOSTOMY SITE;  Surgeon: Michael [name], MD;  [facility];  Service: General;  Laterality: N/A;  . CYSTOGRAM N/A 06/01/2017   Procedure: CYSTOGRAM;  Surgeon: Alexis Frock, MD;  [facility];  Service: Urology;  Laterality: N/A;  . CYSTOSCOPY W/ RETROGRADES Right 11/21/2015   Procedure: CYSTOSCOPY WITH RETROGRADE PYELOGRAM;  Surgeon: Alexis Frock, MD;  [facility];  Service: Urology;  Laterality: Right;  . CYSTOSCOPY W/ URETERAL STENT PLACEMENT Right 11/21/2015   Procedure: CYSTOSCOPY WITH STENT REPLACEMENT;  Surgeon: Alexis Frock, MD;  [facility];  Service: Urology;  Laterality: Right;  . CYSTOSCOPY W/ URETERAL STENT PLACEMENT Right 03/10/2016   Procedure: CYSTOSCOPY WITH STENT REPLACEMENT;  Surgeon: Alexis Frock, MD;  Location: [facility];  Service: Urology;  Laterality: Right;  . CYSTOSCOPY W/ URETERAL STENT PLACEMENT Right 06/30/2016   Procedure: CYSTOSCOPY WITH RETROGRADE PYELOGRAM/URETERAL STENT EXCHANGE;  Surgeon: Alexis Frock, MD;  Location: [facility];  Service: Urology;  Laterality: Right;  . CYSTOSCOPY W/ URETERAL STENT PLACEMENT N/A 06/01/2017   Procedure: CYSTOSCOPY WITH EXAM UNDER ANESTHESIA;  Surgeon: Alexis Frock, MD;  [facility];  Service: Urology;  Laterality: N/A;  . CYSTOSCOPY W/ URETERAL STENT PLACEMENT Right 08/17/2017   Procedure: CYSTOSCOPY WITH RETROGRADE PYELOGRAM/URETERAL STENT REMOVAL;  Surgeon: Alexis Frock, MD;  Location: [facility];  Service: Urology;  Laterality: Right;  . CYSTOSCOPY WITH RETROGRADE PYELOGRAM, URETEROSCOPY AND STENT PLACEMENT Right 03/20/2015   Procedure: CYSTOSCOPY WITH RETROGRADE PYELOGRAM, URETEROSCOPY WITH BALLOON DILATION AND STENT PLACEMENT ON RIGHT;  Surgeon: Alexis Frock, MD;  Location[facility];  Service: Urology;  Laterality: Right;  . CYSTOSCOPY WITH RETROGRADE PYELOGRAM, URETEROSCOPY AND STENT PLACEMENT Right 05/02/2015   Procedure: CYSTOSCOPY WITH RIGHT RETROGRADE PYELOGRAM,  DIAGNOSTIC URETEROSCOPY AND STENT PULL ;  Surgeon: Alexis Frock, MD;  Location: [facility];  Service: Urology;  Laterality: Right;  . CYSTOSCOPY WITH RETROGRADE PYELOGRAM, URETEROSCOPY AND STENT PLACEMENT Right 09/05/2015   Procedure: CYSTOSCOPY WITH RETROGRADE PYELOGRAM,  AND STENT PLACEMENT;  Surgeon: Alexis Frock, MD;  [facility];  Service: Urology;  Laterality: Right;  . CYSTOSCOPY WITH RETROGRADE PYELOGRAM, URETEROSCOPY AND STENT PLACEMENT Right 04/01/2017   Procedure: CYSTOSCOPY WITH RETROGRADE PYELOGRAM, URETEROSCOPY AND STENT PLACEMENT;  Surgeon: Alexis Frock, MD;  [facility];  Service: Urology;  Laterality: Right;  . CYSTOSCOPY WITH STENT PLACEMENT Right 10/27/2016   Procedure: CYSTOSCOPY WITH STENT CHANGE and right retrograde pyelogram;  Surgeon: Alexis Frock, MD;  Location: [facility];  Service: Urology;  Laterality: Right;  . EUS N/A 10/02/2014   Procedure: LOWER ENDOSCOPIC ULTRASOUND (EUS);  Surgeon: Arta Silence, MD;  Location: [facility];  Service: Endoscopy;  Laterality: N/A;  . EXCISION SOFT TISSUE MASS RIGHT FOREMAN  12-08-2006  . EYE SURGERY  as child   pytosis of eyelids repair  . INCISION AND DRAINAGE OF WOUND Bilateral 12/26/2015   Procedure: DEBRIDEMENT OF BILATERAL MASTECTOMY FLAPS;  Surgeon: Irene Limbo, MD;  Location: [place];  Service: Plastics;  Laterality: Bilateral;  . IR CV LINE INJECTION  05/31/2017  . IR FLUORO GUIDE CV LINE LEFT  05/31/2017  . IR FLUORO GUIDE CV LINE RIGHT  04/06/2017  . IR FLUORO GUIDE CV MIDLINE PICC RIGHT  05/30/2017  . IR NEPHROSTOGRAM LEFT INITIAL PLACEMENT  09/02/2017  . IR NEPHROSTOGRAM LEFT THRU EXISTING ACCESS  11/29/2017  . IR NEPHROSTOGRAM RIGHT INITIAL PLACEMENT   09/02/2017  . IR NEPHROSTOGRAM RIGHT THRU EXISTING ACCESS  09/13/2017  . IR NEPHROSTOGRAM RIGHT THRU EXISTING ACCESS  11/29/2017  . IR NEPHROSTOMY EXCHANGE LEFT  11/28/2017  . IR NEPHROSTOMY EXCHANGE LEFT  01/05/2018  . IR NEPHROSTOMY EXCHANGE LEFT  02/16/2018  . IR NEPHROSTOMY EXCHANGE LEFT  03/30/2018  . IR NEPHROSTOMY EXCHANGE LEFT  05/12/2018  . IR NEPHROSTOMY EXCHANGE LEFT  06/21/2018  . IR NEPHROSTOMY EXCHANGE LEFT  08/04/2018  . IR NEPHROSTOMY EXCHANGE LEFT  09/18/2018  . IR NEPHROSTOMY EXCHANGE LEFT  10/09/2018  . IR NEPHROSTOMY EXCHANGE LEFT  10/27/2018  . IR NEPHROSTOMY EXCHANGE LEFT  11/21/2018  . IR NEPHROSTOMY EXCHANGE LEFT  01/05/2019  . IR NEPHROSTOMY EXCHANGE LEFT  02/15/2019  . IR NEPHROSTOMY EXCHANGE LEFT  03/29/2019  . IR NEPHROSTOMY EXCHANGE RIGHT  10/02/2017  . IR NEPHROSTOMY EXCHANGE RIGHT  11/28/2017  . IR NEPHROSTOMY EXCHANGE RIGHT  01/05/2018  . IR NEPHROSTOMY EXCHANGE RIGHT  02/16/2018  . IR NEPHROSTOMY EXCHANGE RIGHT  03/30/2018  . IR NEPHROSTOMY EXCHANGE RIGHT  05/12/2018  . IR NEPHROSTOMY EXCHANGE RIGHT  06/21/2018  . IR NEPHROSTOMY EXCHANGE RIGHT  08/04/2018  . IR NEPHROSTOMY EXCHANGE RIGHT  09/18/2018  . IR NEPHROSTOMY EXCHANGE RIGHT  10/27/2018  . IR NEPHROSTOMY EXCHANGE RIGHT  11/21/2018  . IR NEPHROSTOMY EXCHANGE RIGHT  01/05/2019  . IR NEPHROSTOMY EXCHANGE RIGHT  02/15/2019  . IR NEPHROSTOMY EXCHANGE RIGHT  03/29/2019  . IR NEPHROSTOMY PLACEMENT LEFT  10/02/2017  . IR RADIOLOGIST EVAL & MGMT  05/03/2017  . IR US GUIDE VASC ACCESS LEFT  05/31/2017  . IR US GUIDE VASC ACCESS RIGHT  04/06/2017  . IR US GUIDE VASC ACCESS RIGHT  05/30/2017  . LAPAROSCOPIC CHOLECYSTECTOMY  1990  . LIPOSUCTION WITH LIPOFILLING Bilateral 04/16/2016   Procedure: LIPOSUCTION WITH LIPOFILLING TO BILATERAL CHEST;  Surgeon: Irene Limbo, MD;  Location: [place];  Service: Plastics;  Laterality: Bilateral;  . MASTECTOMY W/ SENTINEL NODE BIOPSY Bilateral 12/11/2015   Procedure: RIGHT  PROPHYLACTIC MASTECTOMY, LEFT TOTAL MASTECTOMY WITH LEFT AXILLARY SENTINEL LYMPH NODE BIOPSY;  Surgeon: Stark Klein, MD;  Location: [place];  Service: General;  Laterality: Bilateral;  . OSTOMY N/A 11/19/2014   Procedure: OSTOMY;  Surgeon: Michael [name], MD;  [facility];  Service: General;  Laterality: N/A;  . PROCTOSCOPY N/A 04/01/2017   Procedure: RIDGE PROCTOSCOPY;  Surgeon: Michael [name], MD;  [facility];  Service: General;  Laterality: N/A;  . REMOVAL OF BILATERAL TISSUE EXPANDERS WITH PLACEMENT OF BILATERAL BREAST IMPLANTS Bilateral 04/16/2016   Procedure: REMOVAL OF BILATERAL TISSUE EXPANDERS WITH PLACEMENT OF BILATERAL BREAST IMPLANTS;  Surgeon: Irene Limbo, MD;  Location: [place];  Service: Plastics;  Laterality: Bilateral;  . ROBOTIC ASSISTED LAP VAGINAL HYSTERECTOMY  N/A 11/19/2014   Procedure: ROBOTIC LYSIS OF ADHESIONS, CONVERTED TO LAPAROTOMY RADICAL UPPER VAGINECTOMY,LOW ANTERIOR BOWEL RESECTION, COLOSTOMY, BILATERAL URETERAL STENT PLACEMENT AND CYSTONOMY CLOSURE;  Surgeon: Everitt Amber, MD;  [facility];  Service: Gynecology;  Laterality: N/A;  . TISSUE EXPANDER FILLING Bilateral 12/26/2015   Procedure: EXPANSION OF BILATERAL CHEST TISSUE EXPANDERS (60 mL- Right; 75 mL- Left);  Surgeon: Irene Limbo, MD;  Location: [place];  Service: Plastics;  Laterality: Bilateral;  . TONSILLECTOMY    . TOTAL ABDOMINAL HYSTERECTOMY  March 2006   [facility]   and Bilateral Salpingoophorectomy/  staging for Ovarian cancer/  an  . XI ROBOTIC ASSISTED LOWER ANTERIOR RESECTION N/A 04/01/2017   Procedure: XI ROBOTIC VS LAPAROSCOPIC COLOSTOMY TAKEDOWN WITH LYSIS OF ADHESIONS.;  Surgeon: Michael [name], MD;  [facility];  Service: General;  Laterality: N/A;  ERAS PATHWAY    Current Medications: Current Meds  Medication Sig  . acetaminophen (TYLENOL) 325 MG tablet Take 2 tablets (650 mg total) by mouth every 6 (six) hours as needed.  [name] Kitchen albuterol  (PROVENTIL HFA) 108 (90 Base) MCG/ACT inhaler Inhale 1 puff into the lungs every 6 (six) hours as needed for wheezing or shortness of breath.  . anastrozole (ARIMIDEX) 1 MG tablet TAKE 1 TABLET DAILY  . Biotin 5 MG TABS Take 5 mg by mouth daily.   . calcium citrate-vitamin D (CITRACAL+D) 315-200 MG-UNIT tablet Take 1 tablet by mouth daily.  . Cholecalciferol (VITAMIN D3) 10000 UNITS capsule Take 10,000 Units by mouth every Sunday.   . clobetasol (OLUX) 0.05 % topical foam Apply topically 2 (two) times daily.  . diphenhydrAMINE (BENADRYL) 25 mg capsule Take 1 capsule (25 mg total) by mouth every 8 (eight) hours as needed for itching, allergies or sleep.  . ferrous sulfate 325 (65 FE) MG tablet Take 1 tablet (325 mg total) by mouth at bedtime.  . filgrastim-aafi (NIVESTYM) 480 MCG/0.8ML SOSY injection 480 mcg subcutaneous injection every 6 days, for life  . levothyroxine (SYNTHROID, LEVOTHROID) 150 MCG tablet Take 1 tablet (150 mcg total) by mouth daily before breakfast.  . loratadine (CLARITIN) 10 MG tablet Take 10 mg by mouth daily.   . Melatonin 10 MG TABS Take 1 tablet by mouth at bedtime as needed.  . mupirocin ointment (BACTROBAN) 2 % Place 1 application into the nose 2 (two) times daily.  [name] Kitchen omega-3 acid ethyl esters (LOVAZA) 1 G capsule Take 1 g by mouth 2 (two) times daily.   Vladimir Faster Glycol-Propyl Glycol (SYSTANE OP) Place 1 drop into both eyes daily as needed (dry eyes).   . pregabalin (LYRICA) 50 MG capsule Take 1 capsule (50 mg total) by mouth 2 (two) times daily.  . Prenatal Vit-Fe Fumarate-FA (PRENATAL VITAMIN PO) Take 1 capsule by mouth daily. Takes prenatal because there are no dyes in it  . Probiotic Product (ALIGN PO) Take by mouth 2 (two) times daily.  . rosuvastatin (CRESTOR) 10 MG tablet Take 10 mg by mouth every evening.   . Saccharomyces boulardii (FLORASTOR PO) Take 1 capsule by mouth daily.  . Semaglutide (RYBELSUS) 3 MG TABS Take 1 tablet by mouth daily.  .  Tbo-Filgrastim (GRANIX) 480 MCG/0.8ML SOSY injection 480 mcg subcutaneous injection every 6 days life long     Allergies:   Penicillins, Cefaclor, Erythromycin, Tape, Trimethoprim, Ultram [tramadol], Cephalosporins, Fluconazole, Neomycin, Oxycodone, Pectin, Septra [sulfamethoxazole-trimethoprim], and Sulfa antibiotics   Social History   Socioeconomic History  . Marital status: Married    Spouse name: Clair Gulling  Reis  . Number of children: 1  . Years of education: Not on file  . Highest education level: Not on file  Occupational History  . 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 Smoking status: Never Smoker  . Smokeless tobacco: Never Used  Vaping Use  . Vaping Use: Never used  Substance and Sexual Activity  . Alcohol use: Not Currently  . Drug use: No  . Sexual activity: Not on file  Other Topics Concern  . Not on file  Social History Narrative   Exercise-- has not gotten back into it since cancer came back   Social Determinants of Health   Financial Resource Strain:   . Difficulty of Paying Living Expenses:   Food Insecurity:   . Worried About Charity fundraiser in the Last Year:   . Arboriculturist in the Last Year:   Transportation Needs:   . Film/video editor (Medical):   [name] Kitchen Lack of Transportation (Non-Medical):   Physical Activity:   . Days of Exercise per Week:   . Minutes of Exercise per Session:   Stress:   . Feeling of Stress :   Social Connections:   . Frequency of Communication with Friends and Family:   . Frequency of Social Gatherings with Friends and Family:   . Attends Religious Services:   . Active Member of Clubs or Organizations:   . Attends Archivist Meetings:   [name] Kitchen Marital Status:      Family History: The patient's family history includes Brain cancer in her paternal grandfather; Breast cancer in her maternal aunt; Cancer (age of onset: 13) in her mother; Cancer (age of onset: 74) in her father;  Diabetes in her brother, father, and sister; Heart disease in her brother and father; Hyperlipidemia in her father; Hypertension in her father and mother; Hypertension (age of onset: y-10) in her brother; Lymphoma in her paternal aunt; Obesity in her father; Ovarian cancer in an other family member.  ROS:   Please see the history of present illness.    Decreased energy.  Sleeping better.  Concerned about first-degree AV block since her father had a pacemaker.  All other systems reviewed and are negative.  EKGs/Labs/Other Studies Reviewed:    The following studies were reviewed today:  ECHOCARDIOGRAPHY 11/26/2019 IMPRESSIONS    1. Left ventricular ejection fraction, by estimation, is 55 to 60%. The  left ventricle has normal function. The left ventricle has no regional  wall motion abnormalities. There is mild left ventricular hypertrophy.  Left ventricular diastolic parameters  are indeterminate.  2. Right ventricular systolic function is normal. The right ventricular  size is normal.  3. The mitral valve is grossly normal. Trivial mitral valve  regurgitation.  4. The aortic valve is tricuspid. Aortic valve regurgitation is not  visualized.  5. The inferior vena cava is normal in size with greater than 50%  respiratory variability, suggesting right atrial pressure of 3 mmHg.   CONTINUOUS MONITOR 01/01/2020 Study Highlights   Basic rhythm is normal sinus rhythm  Paroxysmal atrial fibrillation is noted accounting for less than 1% of heart rhythm and a total of 56 minutes during the 1 month monitor.  First-degree AV block and PACs are noted     EKG:  EKG not repeated  Recent Labs: 11/14/2019: NT-Pro BNP 54 01/18/2020: ALT 18; BUN 33; Creat 1.91; Hemoglobin 12.1; Platelets 241; Potassium 4.4; Sodium 139; TSH 3.38  Recent Lipid Panel    Component Value Date/Time   CHOL 70 01/18/2020 1440   TRIG 120 01/18/2020 1440   HDL 36 (L) 01/18/2020 1440   CHOLHDL 1.9 01/18/2020 1440    VLDL 19.8 08/28/2015 1617   LDLCALC 13 01/18/2020 1440    Physical Exam:    VS:  BP (!) 116/56   Pulse 74   Ht 5' 2"  (1.575 m)   Wt 199 lb 6.4 oz (90.4 kg)   SpO2 98%   BMI 36.47 kg/m     Wt Readings from Last 3 Encounters:  01/25/20 199 lb 6.4 oz (90.4 kg)  01/21/20 197 lb (89.4 kg)  01/18/20 200 lb 3.2 oz (90.8 kg)     GEN: Obese. No acute distress HEENT: Normal NECK: No JVD. LYMPHATICS: No lymphadenopathy CARDIAC:  RRR without murmur, gallop, or edema. VASCULAR:  Normal Pulses. No bruits. RESPIRATORY:  Clear to auscultation without rales, wheezing or rhonchi  ABDOMEN: Soft, non-tender, non-distended, No pulsatile mass, MUSCULOSKELETAL: No deformity  SKIN: Warm and dry NEUROLOGIC:  Alert and oriented x 3 PSYCHIATRIC:  Normal affect   ASSESSMENT:    1. Paroxysmal atrial fibrillation (HCC)   2. DOE (dyspnea on exertion)   3. Benign essential HTN   4. Hyperlipidemia, unspecified hyperlipidemia type   5. Stage 3 chronic kidney disease, unspecified whether stage 3a or 3b CKD   6. Class 2 severe obesity with serious comorbidity and body mass index (BMI) of 36.0 to 36.9 in adult, unspecified obesity type ([place])   7. Type 2 diabetes mellitus with stage 4 chronic kidney disease, without long-term current use of insulin ([place])   8. First degree AV block    PLAN:    In order of problems listed above:  1. No clinical recurrences of atrial fibrillation.  I did recommend aspirin, enteric-coated, 81 mg Monday, Wednesday, and Friday.  Heard Chads Vascular score is 1.   2. Dyspnea has improved.  She is gradually increasing exertional tolerance.   3. Excellent BP on today's exam 4. Continue Lovaza and Crestor. 5. Did not discuss 6. Encourage aerobic activity. 7. Hemoglobin A1c target less than 7.  Was 7.9 when last evaluated. 8. Continue watchful waiting.  Clinical follow-up in 1 year unless increased palpitations or symptoms.  If she has to get off Granix and his switch to  another medication she should determine if there is an impact on the frequency of palpitations.   Medication Adjustments/Labs and Tests Ordered: Current medicines are reviewed at length with the patient today.  Concerns regarding medicines are outlined above.  No orders of the defined types were placed in this encounter.  Meds ordered this encounter  Medications  . aspirin EC 81 MG tablet    Sig: Take 1 tablet (81 mg total) by mouth every Monday, Wednesday, and Friday. Swallow whole.    Dispense:  90 tablet    Refill:  3    Patient Instructions  Medication Instructions:  1) START Enteric Coated Aspirin 55m once daily on Monday, Wednesday and Friday  *If you need a refill on your cardiac medications before your next appointment, please call your pharmacy*   Lab Work: None If you have labs (blood work) drawn today and your tests are completely normal, you will receive your results only by: .[name] KitchenMyChart Message (if you have MyChart) OR . A paper copy in the mail If you have any lab test that is abnormal or we need to change your treatment, we will call you  to review the results.   Testing/Procedures: None   Follow-Up: At [facility], you and your health needs are our priority.  As part of our continuing mission to provide you with exceptional heart care, we have created designated Provider Care Teams.  These Care Teams include your primary Cardiologist (physician) and Advanced Practice Providers (APPs -  Physician Assistants and Nurse Practitioners) who all work together to provide you with the care you need, when you need it.  We recommend signing up for the patient portal called "MyChart".  Sign up information is provided on this After Visit Summary.  MyChart is used to connect with patients for Virtual Visits (Telemedicine).  Patients are able to view lab/test results, encounter notes, upcoming appointments, etc.  Non-urgent messages can be sent to your provider as well.   To  learn more about what you can do with MyChart, go to [email or web address].    Your next appointment:   12 month(s)  The format for your next appointment:   In Person  Provider:   You may see Sinclair Grooms, MD or one of the following Advanced Practice Providers on your designated Care Team:    Truitt Merle, NP  Cecilie Kicks, NP  Kathyrn Drown, NP    Other Instructions      Signed, Sinclair Grooms, MD  01/25/2020 11:18 AM    [place]

## 2020-01-18 ENCOUNTER — Ambulatory Visit (INDEPENDENT_AMBULATORY_CARE_PROVIDER_SITE_OTHER): Payer: BC Managed Care – PPO | Admitting: Family Medicine

## 2020-01-18 ENCOUNTER — Encounter: Payer: Self-pay | Admitting: Family Medicine

## 2020-01-18 ENCOUNTER — Other Ambulatory Visit: Payer: Self-pay

## 2020-01-18 VITALS — BP 98/60 | HR 65 | Temp 97.5°F | Resp 18 | Ht 62.0 in | Wt 200.2 lb

## 2020-01-18 DIAGNOSIS — Z20822 Contact with and (suspected) exposure to covid-19: Secondary | ICD-10-CM | POA: Diagnosis not present

## 2020-01-18 DIAGNOSIS — N184 Chronic kidney disease, stage 4 (severe): Secondary | ICD-10-CM

## 2020-01-18 DIAGNOSIS — Z0000 Encounter for general adult medical examination without abnormal findings: Secondary | ICD-10-CM | POA: Diagnosis not present

## 2020-01-18 DIAGNOSIS — I10 Essential (primary) hypertension: Secondary | ICD-10-CM

## 2020-01-18 DIAGNOSIS — I440 Atrioventricular block, first degree: Secondary | ICD-10-CM

## 2020-01-18 DIAGNOSIS — E1122 Type 2 diabetes mellitus with diabetic chronic kidney disease: Secondary | ICD-10-CM

## 2020-01-18 DIAGNOSIS — E039 Hypothyroidism, unspecified: Secondary | ICD-10-CM

## 2020-01-18 DIAGNOSIS — Z6836 Body mass index (BMI) 36.0-36.9, adult: Secondary | ICD-10-CM

## 2020-01-18 DIAGNOSIS — E1165 Type 2 diabetes mellitus with hyperglycemia: Secondary | ICD-10-CM

## 2020-01-18 DIAGNOSIS — L988 Other specified disorders of the skin and subcutaneous tissue: Secondary | ICD-10-CM

## 2020-01-18 DIAGNOSIS — Z8543 Personal history of malignant neoplasm of ovary: Secondary | ICD-10-CM

## 2020-01-18 DIAGNOSIS — I480 Paroxysmal atrial fibrillation: Secondary | ICD-10-CM

## 2020-01-18 DIAGNOSIS — E785 Hyperlipidemia, unspecified: Secondary | ICD-10-CM | POA: Diagnosis not present

## 2020-01-18 DIAGNOSIS — J3489 Other specified disorders of nose and nasal sinuses: Secondary | ICD-10-CM

## 2020-01-18 DIAGNOSIS — E1169 Type 2 diabetes mellitus with other specified complication: Secondary | ICD-10-CM | POA: Diagnosis not present

## 2020-01-18 MED ORDER — MUPIROCIN 2 % EX OINT: 1.0000 "application " | TOPICAL_OINTMENT | Freq: Two times a day (BID) | CUTANEOUS | 0 refills | Status: DC

## 2020-01-18 NOTE — Assessment & Plan Note (Signed)
Well controlled, no changes to meds. Encouraged heart healthy diet such as the DASH diet and exercise as tolerated.  °

## 2020-01-18 NOTE — Assessment & Plan Note (Signed)
Per cardiology 

## 2020-01-18 NOTE — Patient Instructions (Signed)
Preventive Care 40-64 Years Old, Female Preventive care refers to visits with your health care provider and lifestyle choices that can promote health and wellness. This includes:  A yearly physical exam. This may also be called an annual well check.  Regular dental visits and eye exams.  Immunizations.  Screening for certain conditions.  Healthy lifestyle choices, such as eating a healthy diet, getting regular exercise, not using drugs or products that contain nicotine and tobacco, and limiting alcohol use. What can I expect for my preventive care visit? Physical exam Your health care provider will check your:  Height and weight. This may be used to calculate body mass index (BMI), which tells if you are at a healthy weight.  Heart rate and blood pressure.  Skin for abnormal spots. Counseling Your health care provider may ask you questions about your:  Alcohol, tobacco, and drug use.  Emotional well-being.  Home and relationship well-being.  Sexual activity.  Eating habits.  Work and work environment.  Method of birth control.  Menstrual cycle.  Pregnancy history. What immunizations do I need?  Influenza (flu) vaccine  This is recommended every year. Tetanus, diphtheria, and pertussis (Tdap) vaccine  You may need a Td booster every 10 years. Varicella (chickenpox) vaccine  You may need this if you have not been vaccinated. Zoster (shingles) vaccine  You may need this after age 60. Measles, mumps, and rubella (MMR) vaccine  You may need at least one dose of MMR if you were born in 1957 or later. You may also need a second dose. Pneumococcal conjugate (PCV13) vaccine  You may need this if you have certain conditions and were not previously vaccinated. Pneumococcal polysaccharide (PPSV23) vaccine  You may need one or two doses if you smoke cigarettes or if you have certain conditions. Meningococcal conjugate (MenACWY) vaccine  You may need this if you  have certain conditions. Hepatitis A vaccine  You may need this if you have certain conditions or if you travel or work in places where you may be exposed to hepatitis A. Hepatitis B vaccine  You may need this if you have certain conditions or if you travel or work in places where you may be exposed to hepatitis B. Haemophilus influenzae type b (Hib) vaccine  You may need this if you have certain conditions. Human papillomavirus (HPV) vaccine  If recommended by your health care provider, you may need three doses over 6 months. You may receive vaccines as individual doses or as more than one vaccine together in one shot (combination vaccines). Talk with your health care provider about the risks and benefits of combination vaccines. What tests do I need? Blood tests  Lipid and cholesterol levels. These may be checked every 5 years, or more frequently if you are over 50 years old.  Hepatitis C test.  Hepatitis B test. Screening  Lung cancer screening. You may have this screening every year starting at age 55 if you have a 30-pack-year history of smoking and currently smoke or have quit within the past 15 years.  Colorectal cancer screening. All adults should have this screening starting at age 50 and continuing until age 75. Your health care provider may recommend screening at age 45 if you are at increased risk. You will have tests every 1-10 years, depending on your results and the type of screening test.  Diabetes screening. This is done by checking your blood sugar (glucose) after you have not eaten for a while (fasting). You may have this   done every 1-3 years.  Mammogram. This may be done every 1-2 years. Talk with your health care provider about when you should start having regular mammograms. This may depend on whether you have a family history of breast cancer.  BRCA-related cancer screening. This may be done if you have a family history of breast, ovarian, tubal, or peritoneal  cancers.  Pelvic exam and Pap test. This may be done every 3 years starting at age 66. Starting at age 81, this may be done every 5 years if you have a Pap test in combination with an HPV test. Other tests  Sexually transmitted disease (STD) testing.  Bone density scan. This is done to screen for osteoporosis. You may have this scan if you are at high risk for osteoporosis. Follow these instructions at home: Eating and drinking  Eat a diet that includes fresh fruits and vegetables, whole grains, lean protein, and low-fat dairy.  Take vitamin and mineral supplements as recommended by your health care provider.  Do not drink alcohol if: ? Your health care provider tells you not to drink. ? You are pregnant, may be pregnant, or are planning to become pregnant.  If you drink alcohol: ? Limit how much you have to 0-1 drink a day. ? Be aware of how much alcohol is in your drink. In the [place] one drink equals one 12 oz bottle of beer (355 mL), one 5 oz glass of wine (148 mL), or one 1 oz glass of hard liquor (44 mL). Lifestyle  Take daily care of your teeth and gums.  Stay active. Exercise for at least 30 minutes on 5 or more days each week.  Do not use any products that contain nicotine or tobacco, such as cigarettes, e-cigarettes, and chewing tobacco. If you need help quitting, ask your health care provider.  If you are sexually active, practice safe sex. Use a condom or other form of birth control (contraception) in order to prevent pregnancy and STIs (sexually transmitted infections).  If told by your health care provider, take low-dose aspirin daily starting at age 62. What's next?  Visit your health care provider once a year for a well check visit.  Ask your health care provider how often you should have your eyes and teeth checked.  Stay up to date on all vaccines. This information is not intended to replace advice given to you by your health care provider. Make sure you  discuss any questions you have with your health care provider. Document Revised: 03/30/2018 Document Reviewed: 03/30/2018 Elsevier Patient Education  2020 [employer].

## 2020-01-18 NOTE — Progress Notes (Signed)
[name] is a 64 y.o. female and is here for a comprehensive physical exam. The patient reports problems - fistula not healing-- needs referral back to Psychiatric nurse.   She still sees gyn / onc for uterine cancer and breast .  [facility] still [facility] to [facility] for most of her care.    Social History   Socioeconomic History  . Marital status: Married    Spouse name: Gitty Osterlund  . Number of children: 1  . Years of education: Not on file  . Highest education level: Not on file  Occupational History  . 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 Smoking status: Never Smoker  . Smokeless tobacco: Never Used  Vaping Use  . Vaping Use: Never used  Substance and Sexual Activity  . Alcohol use: Not Currently  . Drug use: No  . Sexual activity: Not on file  Other Topics Concern  . Not on file  Social History Narrative   Exercise-- has not gotten back into it since cancer came back   Social Determinants of Health   Financial Resource Strain:   . Difficulty of Paying Living Expenses:   Food Insecurity:   . Worried About Charity fundraiser in the Last Year:   . Arboriculturist in the Last Year:   Transportation Needs:   . Film/video editor (Medical):   [name] Kitchen Lack of Transportation (Non-Medical):   Physical Activity:   . Days of Exercise per Week:   . Minutes of Exercise per Session:   Stress:   . Feeling of Stress :   Social Connections:   . Frequency of Communication with Friends and Family:   . Frequency of Social Gatherings with Friends and Family:   . Attends Religious Services:   . Active Member of Clubs or Organizations:   . Attends Archivist Meetings:   [name] Kitchen Marital Status:   Intimate Partner Violence:   . Fear of Current or Ex-Partner:   . Emotionally Abused:   [name] Kitchen Physically Abused:   . Sexually Abused:    Health Maintenance  Topic Date Due  . URINE MICROALBUMIN  08/30/2017  . HEMOGLOBIN A1C  03/01/2018  . FOOT  EXAM  05/26/2019  . MAMMOGRAM  02/26/2020 (Originally 10/29/2017)  . INFLUENZA VACCINE  03/02/2020  . OPHTHALMOLOGY EXAM  03/20/2020  . PNA vac Low Risk Adult (2 of 2 - PPSV23) 08/30/2021  . COLONOSCOPY  08/22/2023  . TETANUS/TDAP  08/27/2025  . DEXA SCAN  Completed  . COVID-19 Vaccine  Completed  . Hepatitis C Screening  Completed  . HIV Screening  Completed  . PAP SMEAR-Modifier  Discontinued    The following portions of the patient's history were reviewed and updated as appropriate:  She  has a past medical history of Adrenal adenoma, left (02/08/2016), Anemia in neoplastic disease, Back pain, Benign essential HTN, Breast cancer, left ([place]) (dx 10-30-2015  oncologist-  dr Heath Lark), Cancer of corpus uteri, except isthmus [facility]) ( oncologist-- dr Denman [name] and dr Alvy Bimler ), Chronic idiopathic neutropenia ([place]), Chronic nausea, Chronic pain, CKD stage G2/A3, GFR 60-89 and albumin creatinine ratio >300 mg/g, Colovesical fistula, Diabetic retinopathy, background ([place]), Difficult intravenous access, DM type 2 (diabetes mellitus, type 2) ([place]), Dysuria, Environmental and seasonal allergies, Fatty liver (02/08/2016), Generalized muscle weakness, GERD (gastroesophageal reflux disease), Hiatal hernia, History of abdominal abscess (04/16/2017), History of gastric polyp, History of ileus (04/16/2017),  History of radiation therapy, Hypothyroidism, IBS (irritable bowel syndrome), Ileostomy in place [facility]) (04/01/2017), Joint pain, Leg edema, Lower urinary tract symptoms (LUTS), Mixed dyslipidemia, Multiple thyroid nodules, Nephrostomy status ([place]), Palpitations, Pelvic abscess in female (04/16/2017), PONV (postoperative nausea and vomiting), Radiation-induced dermatitis, SBO (small bowel obstruction) ([place]) (01/2019), Seasonal allergies, Ureteral stricture, right (UROLOGIT-  [facility]), 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 She does not have any pertinent problems on  file. She  has a past surgical history that includes Appendectomy; Tonsillectomy; Colonoscopy with propofol (N/A, 08/21/2013); EUS (N/A, 10/02/2014); Robotic assisted lap vaginal hysterectomy (N/A, 11/19/2014); Ostomy (N/A, 11/19/2014); Colostomy takedown (N/A, 12/04/2014); Cystoscopy with retrograde pyelogram, ureteroscopy and stent placement (Right, 03/20/2015); EXCISION SOFT TISSUE MASS RIGHT FOREMAN (12-08-2006); Cystoscopy with retrograde pyelogram, ureteroscopy and stent placement (Right, 05/02/2015); Cystoscopy with retrograde pyelogram, ureteroscopy and stent placement (Right, 09/05/2015); Cystoscopy w/ retrogrades (Right, 11/21/2015); Cystoscopy w/ ureteral stent placement (Right, 11/21/2015); Mastectomy w/ sentinel node biopsy (Bilateral, 12/11/2015); Breast reconstruction with placement of tissue expander and flex hd (acellular hydrated dermis) (Bilateral, 12/11/2015); Incision and drainage of wound (Bilateral, 12/26/2015); Tissue expander filling (Bilateral, 12/26/2015); Laparoscopic cholecystectomy (1990); Eye surgery (as child); Cystoscopy w/ ureteral stent placement (Right, 03/10/2016); Removal of bilateral tissue expanders with placement of bilateral breast implants (Bilateral, 04/16/2016); Liposuction with lipofilling (Bilateral, 04/16/2016); Cystoscopy w/ ureteral stent placement (Right, 06/30/2016); Cystoscopy with stent placement (Right, 10/27/2016); XI robotic assisted lower anterior resection (N/A, 04/01/2017); Proctoscopy (N/A, 04/01/2017); Cystoscopy with retrograde pyelogram, ureteroscopy and stent placement (Right, 04/01/2017); IR US Guide Vasc Access Right (04/06/2017); IR Fluoro Guide CV Line Right (04/06/2017); IR US Guide Vasc Access Right (05/30/2017); IR Fluoro Guide CV Midline PICC Right (05/30/2017); IR Fluoro Guide CV Line Left (05/31/2017); IR US Guide Vasc Access Left (05/31/2017); IR CV Line Injection (05/31/2017); IR Radiologist Eval & Mgmt (05/03/2017); Cystoscopy w/ ureteral stent placement (N/A,  06/01/2017); Cystogram (N/A, 06/01/2017); Total abdominal hysterectomy (March 2006   [facility]); Cystoscopy w/ ureteral stent placement (Right, 08/17/2017); IR NEPHROSTOGRAM RIGHT INITIAL PLACEMENT (09/02/2017); IR NEPHROSTOGRAM LEFT INITIAL PLACEMENT (09/02/2017); IR NEPHROSTOGRAM RIGHT THRU EXISTING ACCESS (09/13/2017); IR NEPHROSTOMY PLACEMENT LEFT (10/02/2017); IR NEPHROSTOMY EXCHANGE RIGHT (10/02/2017); IR NEPHROSTOMY EXCHANGE RIGHT (11/28/2017); IR NEPHROSTOMY EXCHANGE LEFT (11/28/2017); IR NEPHROSTOGRAM LEFT THRU EXISTING ACCESS (11/29/2017); IR NEPHROSTOGRAM RIGHT THRU EXISTING ACCESS (11/29/2017); IR NEPHROSTOMY EXCHANGE LEFT (01/05/2018); IR NEPHROSTOMY EXCHANGE RIGHT (01/05/2018); IR NEPHROSTOMY EXCHANGE RIGHT (02/16/2018); IR NEPHROSTOMY EXCHANGE LEFT (02/16/2018); IR NEPHROSTOMY EXCHANGE RIGHT (03/30/2018); IR NEPHROSTOMY EXCHANGE LEFT (03/30/2018); biopsy thyroid nodules; IR NEPHROSTOMY EXCHANGE RIGHT (05/12/2018); IR NEPHROSTOMY EXCHANGE LEFT (05/12/2018); IR NEPHROSTOMY EXCHANGE RIGHT (06/21/2018); IR NEPHROSTOMY EXCHANGE LEFT (06/21/2018); IR NEPHROSTOMY EXCHANGE LEFT (08/04/2018); IR NEPHROSTOMY EXCHANGE RIGHT (08/04/2018); IR NEPHROSTOMY EXCHANGE RIGHT (09/18/2018); IR NEPHROSTOMY EXCHANGE LEFT (09/18/2018); IR NEPHROSTOMY EXCHANGE LEFT (10/09/2018); IR NEPHROSTOMY EXCHANGE RIGHT (10/27/2018); IR NEPHROSTOMY EXCHANGE LEFT (10/27/2018); IR NEPHROSTOMY EXCHANGE RIGHT (11/21/2018); IR NEPHROSTOMY EXCHANGE LEFT (11/21/2018); IR NEPHROSTOMY EXCHANGE LEFT (01/05/2019); IR NEPHROSTOMY EXCHANGE RIGHT (01/05/2019); IR NEPHROSTOMY EXCHANGE LEFT (02/15/2019); IR NEPHROSTOMY EXCHANGE RIGHT (02/15/2019); IR NEPHROSTOMY EXCHANGE RIGHT (03/29/2019); and IR NEPHROSTOMY EXCHANGE LEFT (03/29/2019). Her family history includes Brain cancer in her paternal grandfather; Breast cancer in her maternal aunt; Cancer (age of onset: 9) in her mother; Cancer (age of onset: 4) in her father; Diabetes in her brother, father, and sister; Heart disease in her brother  and father; Hyperlipidemia in her father; Hypertension in her father and mother; Hypertension (age of onset: y-10) in her brother; Lymphoma in her paternal aunt; Obesity in her father; Ovarian cancer  in an other family member. She  reports that she has never smoked. She has never used smokeless tobacco. She reports previous alcohol use. She reports that she does not use drugs. She has a current medication list which includes the following prescription(s): acetaminophen, albuterol, anastrozole, biotin, calcium citrate-vitamin d, vitamin d3, clobetasol, diphenhydramine, ferrous sulfate, nivestym, levothyroxine, loratadine, melatonin, omega-3 acid ethyl esters, polyethyl glycol-propyl glycol, pregabalin, prenatal vit-fe fumarate-fa, probiotic product, rosuvastatin, saccharomyces boulardii, rybelsus, tbo-filgrastim, and mupirocin ointment. Current Outpatient Medications on File Prior to Visit  Medication Sig Dispense Refill  . acetaminophen (TYLENOL) 325 MG tablet Take 2 tablets (650 mg total) by mouth every 6 (six) hours as needed. 30 tablet 1  . albuterol (PROVENTIL HFA) 108 (90 Base) MCG/ACT inhaler Inhale 1 puff into the lungs every 6 (six) hours as needed for wheezing or shortness of breath. 18 g 2  . anastrozole (ARIMIDEX) 1 MG tablet TAKE 1 TABLET DAILY 90 tablet 3  . Biotin 5 MG TABS Take 5 mg by mouth daily.     . calcium citrate-vitamin D (CITRACAL+D) 315-200 MG-UNIT tablet Take 1 tablet by mouth daily.    . Cholecalciferol (VITAMIN D3) 10000 UNITS capsule Take 10,000 Units by mouth every Sunday.     . clobetasol (OLUX) 0.05 % topical foam Apply topically 2 (two) times daily.    . diphenhydrAMINE (BENADRYL) 25 mg capsule Take 1 capsule (25 mg total) by mouth every 8 (eight) hours as needed for itching, allergies or sleep. 30 capsule 0  . ferrous sulfate 325 (65 FE) MG tablet Take 1 tablet (325 mg total) by mouth at bedtime. 30 tablet 3  . filgrastim-aafi (NIVESTYM) 480 MCG/0.8ML SOSY injection  480 mcg subcutaneous injection every 6 days, for life 11.2 mL 11  . levothyroxine (SYNTHROID, LEVOTHROID) 150 MCG tablet Take 1 tablet (150 mcg total) by mouth daily before breakfast. 30 tablet 1  . loratadine (CLARITIN) 10 MG tablet Take 10 mg by mouth daily.     . Melatonin 10 MG TABS Take 1 tablet by mouth at bedtime as needed.    [name] Kitchen omega-3 acid ethyl esters (LOVAZA) 1 G capsule Take 1 g by mouth 2 (two) times daily.     Vladimir Faster Glycol-Propyl Glycol (SYSTANE OP) Place 1 drop into both eyes daily as needed (dry eyes).     . pregabalin (LYRICA) 50 MG capsule Take 1 capsule (50 mg total) by mouth 2 (two) times daily. 180 capsule 1  . Prenatal Vit-Fe Fumarate-FA (PRENATAL VITAMIN PO) Take 1 capsule by mouth daily. Takes prenatal because there are no dyes in it    . Probiotic Product (ALIGN PO) Take by mouth 2 (two) times daily.    . rosuvastatin (CRESTOR) 10 MG tablet Take 10 mg by mouth every evening.     . Saccharomyces boulardii (FLORASTOR PO) Take 1 capsule by mouth daily.    . Semaglutide (RYBELSUS) 3 MG TABS Take 1 tablet by mouth daily. 90 tablet 0  . Tbo-Filgrastim (GRANIX) 480 MCG/0.8ML SOSY injection 480 mcg subcutaneous injection every 6 days life long 11.2 mL 11   No current facility-administered medications on file prior to visit.   She is allergic to penicillins, cefaclor, erythromycin, tape, trimethoprim, ultram [tramadol], cephalosporins, fluconazole, neomycin, oxycodone, pectin, septra [sulfamethoxazole-trimethoprim], and sulfa antibiotics..  Review of Systems Review of Systems  Constitutional: Negative for activity change, appetite change and fatigue.  HENT: Negative for hearing loss, congestion, tinnitus and ear discharge.  dentist q65m Eyes: Negative for visual disturbance (see optho  q1y -- vision corrected to 20/20 with glasses).  Respiratory: Negative for cough, chest tightness and shortness of breath.   Cardiovascular: Negative for chest pain, palpitations and leg  swelling.  Gastrointestinal: Negative for abdominal pain, diarrhea, constipation and abdominal distention.  Genitourinary: Negative for urgency, frequency, decreased urine volume and difficulty urinating.  Musculoskeletal: Negative for back pain, arthralgias and gait problem.  Skin: Negative for color change, pallor and rash.  Neurological: Negative for dizziness, light-headedness, numbness and headaches.  Hematological: Negative for adenopathy. Does not bruise/bleed easily.  Psychiatric/Behavioral: Negative for suicidal ideas, confusion, sleep disturbance, self-injury, dysphoric mood, decreased concentration and agitation.       Objective:    BP 98/60 (BP Location: Right Arm, Patient Position: Sitting, Cuff Size: Large)   Pulse 65   Temp (!) 97.5 F (36.4 C) (Temporal)   Resp 18   Ht 5\' 2"  (1.575 m)   Wt 200 lb 3.2 oz (90.8 kg)   SpO2 97%   BMI 36.62 kg/m  General appearance: alert, cooperative, appears stated age and no distress Head: Normocephalic, without obvious abnormality, atraumatic Eyes: negative findings: lids and lashes normal, conjunctivae and sclerae normal and pupils equal, round, reactive to light and accomodation Ears: normal TM's and external ear canals both ears Neck: no adenopathy, no carotid bruit, no JVD, supple, symmetrical, trachea midline and thyroid not enlarged, symmetric, no tenderness/mass/nodules Back: symmetric, no curvature. ROM normal. No CVA tenderness. Lungs: clear to auscultation bilaterally Breasts: normal appearance, no masses or tenderness, gyn Heart: regular rate and rhythm, S1, S2 normal, no murmur, click, rub or gallop Abdomen: soft, non-tender; bowel sounds normal; no masses,  no organomegaly--- - iliostomy bag Pelvic: deferred--gyn Extremities: extremities normal, atraumatic, no cyanosis or edema Pulses: 2+ and symmetric Skin: Skin color, texture, turgor normal. No rashes or lesions Lymph nodes: Cervical, supraclavicular, and axillary  nodes normal. Neurologic: Alert and oriented X 3, normal strength and tone. Normal symmetric reflexes. Normal coordination and gait    Assessment:    Healthy female exam.      Plan:    ghm utd Check labs  See After Visit Summary for Counseling Recommendations   1. Uncontrolled type 2 diabetes mellitus with hyperglycemia ([place]) Per endo - Comprehensive metabolic panel  2. Essential hypertension Well controlled, no changes to meds. Encouraged heart healthy diet such as the DASH diet and exercise as tolerated.   - CBC with Differential/Platelet - Comprehensive metabolic panel  3. Hyperlipidemia associated with type 2 diabetes mellitus ([place]) Encouraged heart healthy diet, increase exercise, avoid trans fats, consider a krill oil cap daily - Lipid panel - Comprehensive metabolic panel  4. Hypothyroidism, unspecified type Check labs  Per endo  - TSH  5. Fistula   - Ambulatory referral to Plastic Surgery  6. Nasal sore   - mupirocin ointment (BACTROBAN) 2 %; Place 1 application into the nose 2 (two) times daily.  Dispense: 22 g; Refill: 0  7. First degree heart block  Per cardiology  8. Preventative health care See above  9. Paroxysmal atrial fibrillation (HCC) Per cardiololgy   10. Morbid obesity ([place]) Healthy weight and wellness  - Insulin, random - VITAMIN D 25 Hydroxy (Vit-D Deficiency, Fractures); Future  11. Exposure to COVID-19 virus   - SARS-CoV-2 Antibody(IgG)Spike,Semi-Quantitative  12. Type 2 diabetes mellitus with stage 4 chronic kidney disease, without long-term current use of insulin (HCC) Per endo   13. Benign essential HTN Well controlled, no changes to meds. Encouraged heart healthy diet such as the DASH  diet and exercise as tolerated.    14. History of ovarian & endometrial cancer Per gyn/ onc   15. Class 2 severe obesity with serious comorbidity and body mass index (BMI) of 36.0 to 36.9 in adult, unspecified obesity type  ([place]) Healthy weight and wellness

## 2020-01-18 NOTE — Assessment & Plan Note (Signed)
S/p TAH/BSO Still sees gyn/ onc

## 2020-01-18 NOTE — Assessment & Plan Note (Signed)
Per healthy weight and wellness 

## 2020-01-18 NOTE — Assessment & Plan Note (Signed)
Per endo °

## 2020-01-21 ENCOUNTER — Ambulatory Visit (INDEPENDENT_AMBULATORY_CARE_PROVIDER_SITE_OTHER): Payer: BC Managed Care – PPO | Admitting: Family Medicine

## 2020-01-21 ENCOUNTER — Other Ambulatory Visit: Payer: Self-pay

## 2020-01-21 ENCOUNTER — Encounter (INDEPENDENT_AMBULATORY_CARE_PROVIDER_SITE_OTHER): Payer: Self-pay | Admitting: Family Medicine

## 2020-01-21 VITALS — BP 107/72 | HR 67 | Temp 97.9°F | Ht 62.0 in | Wt 197.0 lb

## 2020-01-21 DIAGNOSIS — Z6836 Body mass index (BMI) 36.0-36.9, adult: Secondary | ICD-10-CM

## 2020-01-21 DIAGNOSIS — E860 Dehydration: Secondary | ICD-10-CM | POA: Diagnosis not present

## 2020-01-21 LAB — COMPREHENSIVE METABOLIC PANEL
AG Ratio: 1.1 (calc) (ref 1.0–2.5)
ALT: 18 U/L (ref 6–29)
AST: 14 U/L (ref 10–35)
Albumin: 3.9 g/dL (ref 3.6–5.1)
Alkaline phosphatase (APISO): 129 U/L (ref 37–153)
BUN/Creatinine Ratio: 17 (calc) (ref 6–22)
BUN: 33 mg/dL — ABNORMAL HIGH (ref 7–25)
CO2: 23 mmol/L (ref 20–32)
Calcium: 8.8 mg/dL (ref 8.6–10.4)
Chloride: 106 mmol/L (ref 98–110)
Creat: 1.91 mg/dL — ABNORMAL HIGH (ref 0.50–0.99)
Globulin: 3.4 g/dL (calc) (ref 1.9–3.7)
Glucose, Bld: 126 mg/dL — ABNORMAL HIGH (ref 65–99)
Potassium: 4.4 mmol/L (ref 3.5–5.3)
Sodium: 139 mmol/L (ref 135–146)
Total Bilirubin: 0.3 mg/dL (ref 0.2–1.2)
Total Protein: 7.3 g/dL (ref 6.1–8.1)

## 2020-01-21 LAB — CBC WITH DIFFERENTIAL/PLATELET
Absolute Monocytes: 536 cells/uL (ref 200–950)
Basophils Absolute: 61 cells/uL (ref 0–200)
Basophils Relative: 1.7 %
Eosinophils Absolute: 79 cells/uL (ref 15–500)
Eosinophils Relative: 2.2 %
HCT: 37.8 % (ref 35.0–45.0)
Hemoglobin: 12.1 g/dL (ref 11.7–15.5)
Lymphs Abs: 806 cells/uL — ABNORMAL LOW (ref 850–3900)
MCH: 30.5 pg (ref 27.0–33.0)
MCHC: 32 g/dL (ref 32.0–36.0)
MCV: 95.2 fL (ref 80.0–100.0)
MPV: 10.4 fL (ref 7.5–12.5)
Monocytes Relative: 14.9 %
Neutro Abs: 2117 cells/uL (ref 1500–7800)
Neutrophils Relative %: 58.8 %
Platelets: 241 10*3/uL (ref 140–400)
RBC: 3.97 10*6/uL (ref 3.80–5.10)
RDW: 12.1 % (ref 11.0–15.0)
Total Lymphocyte: 22.4 %
WBC: 3.6 10*3/uL — ABNORMAL LOW (ref 3.8–10.8)

## 2020-01-21 LAB — LIPID PANEL
Cholesterol: 70 mg/dL (ref <200)
HDL: 36 mg/dL — ABNORMAL LOW (ref >=50)
LDL Cholesterol (Calc): 13 mg/dL (calc)
Non-HDL Cholesterol (Calc): 34 mg/dL (calc) (ref <130)
Total CHOL/HDL Ratio: 1.9 (calc) (ref <5.0)
Triglycerides: 120 mg/dL (ref <150)

## 2020-01-21 LAB — SARS-COV-2 ANTIBODY(IGG)SPIKE,SEMI-QUANTITATIVE: SARS COV1 AB(IGG)SPIKE,SEMI QN: 1.48 index — ABNORMAL HIGH (ref <1.00)

## 2020-01-21 LAB — INSULIN, RANDOM: Insulin: 44.9 u[IU]/mL — ABNORMAL HIGH

## 2020-01-21 LAB — TSH: TSH: 3.38 mIU/L (ref 0.40–4.50)

## 2020-01-23 NOTE — Progress Notes (Signed)
Chief Complaint:   OBESITY [name] is here to discuss her progress with her obesity treatment plan along with follow-up of her obesity related diagnoses. [name] is on keeping a food journal and adhering to recommended goals of 1100-1300 calories and 75+ grams of protein daily and states she is following her eating plan approximately 25% of the time. [place]on states she is walking some.  Today's visit was #: 65 Starting weight: 208 lbs Starting date: 04/05/2018 Today's weight: 197 lbs Today's date: 01/21/2020 Total lbs lost to date: 11 Total lbs lost since last in-office visit: 0  Interim History: [name] was on vacation and she did some celebration eating, but she is now back home and has gotten back [name]. She still struggles to eat all of her protein especially when traveling.  Subjective:   1. Dehydration [name]'s BUN is elevated but better than earlier this month. She has tried to increased her water in general.  Assessment/Plan:   1. Dehydration [name] was encouraged to increase her water, goal is at least 80+ oz daily.  2. Class 2 severe obesity with serious comorbidity and body mass index (BMI) of 36.0 to 36.9 in adult, unspecified obesity type (HCC) [name] is currently in the action stage of change. As such, her goal is to continue with weight loss efforts. She has agreed to keeping a food journal and adhering to recommended goals of 1100-1300 calories and 75+ grams of protein daily or the [place].   Exercise goals: As is.  Behavioral modification strategies: increasing lean protein intake, increasing vegetables, increasing water intake and meal planning and cooking strategies.  [name] has agreed to follow-up with our clinic in 4 weeks. She was informed of the importance of frequent follow-up visits to maximize her success with intensive lifestyle modifications for her multiple health conditions.   Objective:   Blood pressure 107/72, pulse 67, temperature 97.9 F (36.6  C), temperature source Oral, height 5\' 2"  (1.575 m), weight 197 lb (89.4 kg), SpO2 98 %. Body mass index is 36.03 kg/m.  General: Cooperative, alert, well developed, in no acute distress. HEENT: Conjunctivae and lids unremarkable. Cardiovascular: Regular rhythm.  Lungs: Normal work of breathing. Neurologic: No focal deficits.   Lab Results  Component Value Date   CREATININE 1.91 (H) 01/18/2020   BUN 33 (H) 01/18/2020   NA 139 01/18/2020   K 4.4 01/18/2020   CL 106 01/18/2020   CO2 23 01/18/2020   Lab Results  Component Value Date   ALT 18 01/18/2020   AST 14 01/18/2020   ALKPHOS 174 (H) 10/02/2019   BILITOT 0.3 01/18/2020   Lab Results  Component Value Date   HGBA1C 7.9 (H) 09/01/2017   HGBA1C 5.3 04/01/2017   HGBA1C 5.8 (H) 09/05/2015   HGBA1C 6.8 (H) 11/21/2014   HGBA1C 6.3 (H) 11/07/2014   No results found for: INSULIN Lab Results  Component Value Date   TSH 3.38 01/18/2020   Lab Results  Component Value Date   CHOL 70 01/18/2020   HDL 36 (L) 01/18/2020   LDLCALC 13 01/18/2020   TRIG 120 01/18/2020   CHOLHDL 1.9 01/18/2020   Lab Results  Component Value Date   WBC 3.6 (L) 01/18/2020   HGB 12.1 01/18/2020   HCT 37.8 01/18/2020   MCV 95.2 01/18/2020   PLT 241 01/18/2020   Lab Results  Component Value Date   IRON 7 (L) 08/31/2017   TIBC 164 (L) 08/31/2017   FERRITIN 27 11/29/2014   Attestation Statements:  Reviewed by clinician on day of visit: allergies, medications, problem list, medical history, surgical history, family history, social history, and previous encounter notes.  Time spent on visit including pre-visit chart review and post-visit care and charting was 31 minutes.    I, Trixie Dredge, am acting as transcriptionist for Dennard Nip, MD.  I have reviewed the above documentation for accuracy and completeness, and I agree with the above. -  Dennard Nip, MD

## 2020-01-25 ENCOUNTER — Ambulatory Visit (INDEPENDENT_AMBULATORY_CARE_PROVIDER_SITE_OTHER): Payer: BC Managed Care – PPO | Admitting: Interventional Cardiology

## 2020-01-25 ENCOUNTER — Encounter: Payer: Self-pay | Admitting: Interventional Cardiology

## 2020-01-25 ENCOUNTER — Other Ambulatory Visit: Payer: Self-pay

## 2020-01-25 VITALS — BP 116/56 | HR 74 | Ht 62.0 in | Wt 199.4 lb

## 2020-01-25 DIAGNOSIS — E785 Hyperlipidemia, unspecified: Secondary | ICD-10-CM | POA: Diagnosis not present

## 2020-01-25 DIAGNOSIS — N184 Chronic kidney disease, stage 4 (severe): Secondary | ICD-10-CM

## 2020-01-25 DIAGNOSIS — I440 Atrioventricular block, first degree: Secondary | ICD-10-CM

## 2020-01-25 DIAGNOSIS — Z6836 Body mass index (BMI) 36.0-36.9, adult: Secondary | ICD-10-CM

## 2020-01-25 DIAGNOSIS — I10 Essential (primary) hypertension: Secondary | ICD-10-CM

## 2020-01-25 DIAGNOSIS — I480 Paroxysmal atrial fibrillation: Secondary | ICD-10-CM | POA: Diagnosis not present

## 2020-01-25 DIAGNOSIS — R0600 Dyspnea, unspecified: Secondary | ICD-10-CM | POA: Diagnosis not present

## 2020-01-25 DIAGNOSIS — N183 Chronic kidney disease, stage 3 unspecified: Secondary | ICD-10-CM

## 2020-01-25 DIAGNOSIS — E1122 Type 2 diabetes mellitus with diabetic chronic kidney disease: Secondary | ICD-10-CM

## 2020-01-25 DIAGNOSIS — R0609 Other forms of dyspnea: Secondary | ICD-10-CM

## 2020-01-25 DIAGNOSIS — E66812 Obesity, class 2: Secondary | ICD-10-CM

## 2020-01-25 MED ORDER — ASPIRIN EC 81 MG PO TBEC
81.0000 mg | DELAYED_RELEASE_TABLET | ORAL | 3 refills | Status: DC
Start: 2020-01-25 — End: 2021-01-29

## 2020-01-25 NOTE — Patient Instructions (Signed)
Medication Instructions:  1) START Enteric Coated Aspirin 81mg  once daily on Monday, Wednesday and Friday  *If you need a refill on your cardiac medications before your next appointment, please call your pharmacy*   Lab Work: None If you have labs (blood work) drawn today and your tests are completely normal, you will receive your results only by: [name] Kitchen MyChart Message (if you have MyChart) OR . A paper copy in the mail If you have any lab test that is abnormal or we need to change your treatment, we will call you to review the results.   Testing/Procedures: None   Follow-Up: At [facility], you and your health needs are our priority.  As part of our continuing mission to provide you with exceptional heart care, we have created designated Provider Care Teams.  These Care Teams include your primary Cardiologist (physician) and Advanced Practice Providers (APPs -  Physician Assistants and Nurse Practitioners) who all work together to provide you with the care you need, when you need it.  We recommend signing up for the patient portal called "MyChart".  Sign up information is provided on this After Visit Summary.  MyChart is used to connect with patients for Virtual Visits (Telemedicine).  Patients are able to view lab/test results, encounter notes, upcoming appointments, etc.  Non-urgent messages can be sent to your provider as well.   To learn more about what you can do with MyChart, go to [email or web address].    Your next appointment:   12 month(s)  The format for your next appointment:   In Person  Provider:   You may see Sinclair Grooms, MD or one of the following Advanced Practice Providers on your designated Care Team:    Truitt Merle, NP  Cecilie Kicks, NP  Kathyrn Drown, NP    Other Instructions

## 2020-02-06 IMAGING — XA IR EXCHANGE NEPHROSTOMY RIGHT
1 series · 7 of 7 positions shown · non-contrast
Comparison: Fluoroscopic guided nephrostomy catheter
exchange-01/05/2019;

INDICATION: History of recurrent endometrial cancer with chronic bilateral
percutaneous nephrostomy catheters for purposes of urinary
diversion.
TECHNIQUE: Informed written consent was obtained from the patient after a
discussion of the risks, benefits and alternatives to treatment.
Questions regarding the procedure were encouraged and answered. A
timeout was performed prior to the initiation of the procedure.

[Series 300: ir nephrostomy exchange right · 7 of 7 slices shown]
[im 1/7]
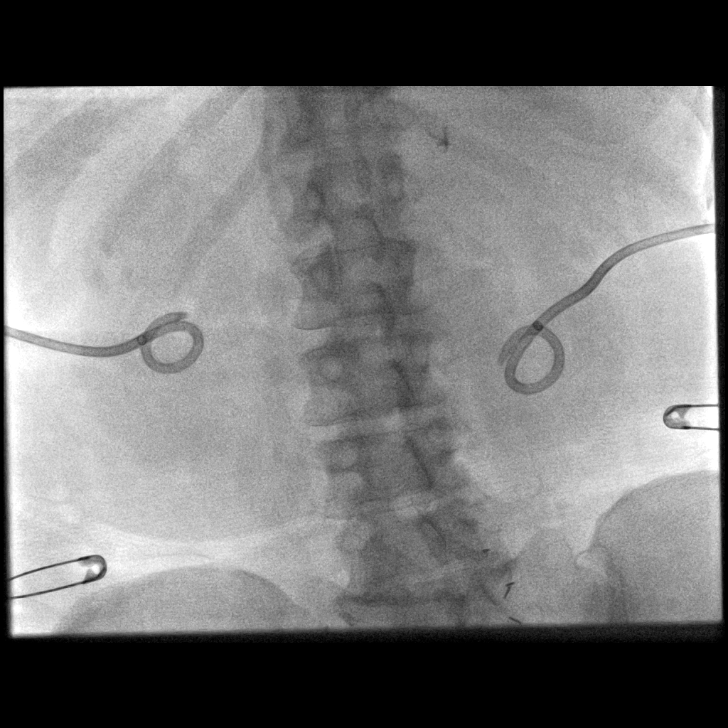
[im 2/7]
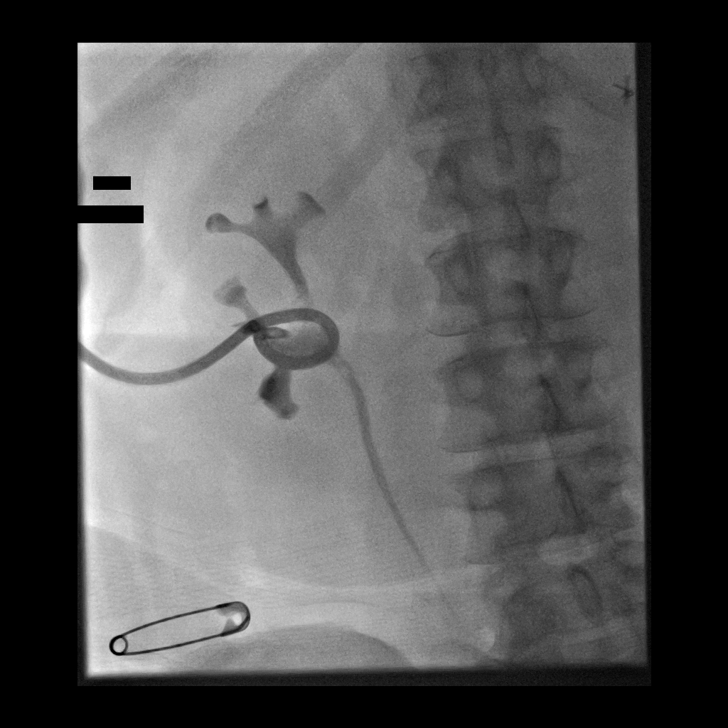
[im 3/7]
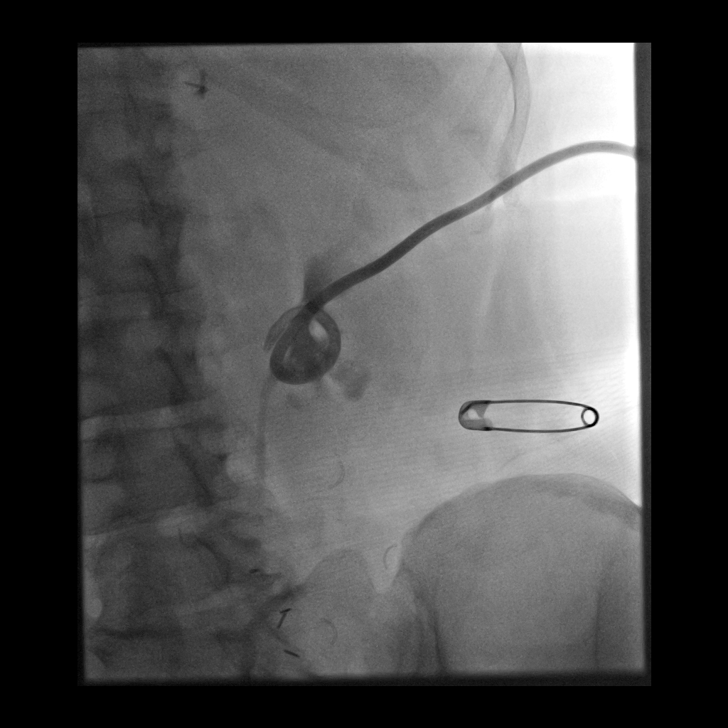
[im 4/7]
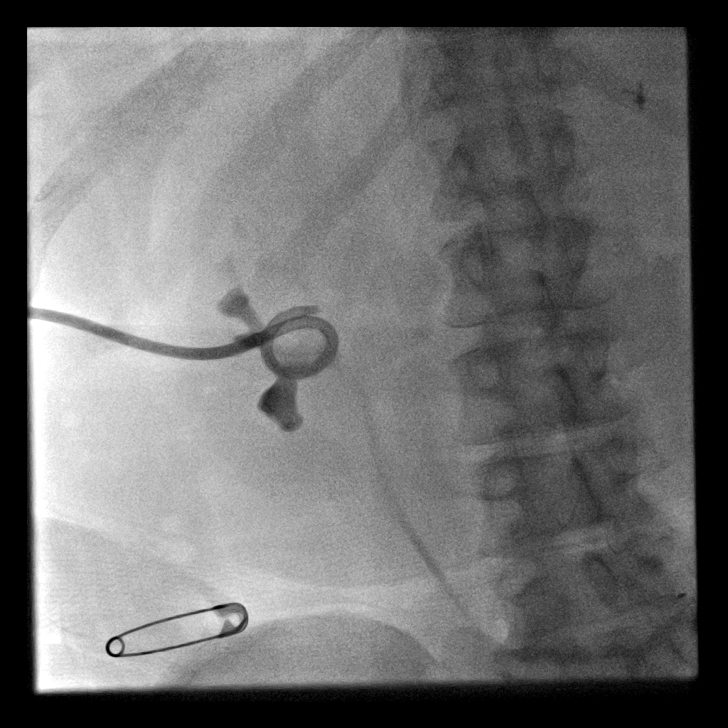
[im 5/7]
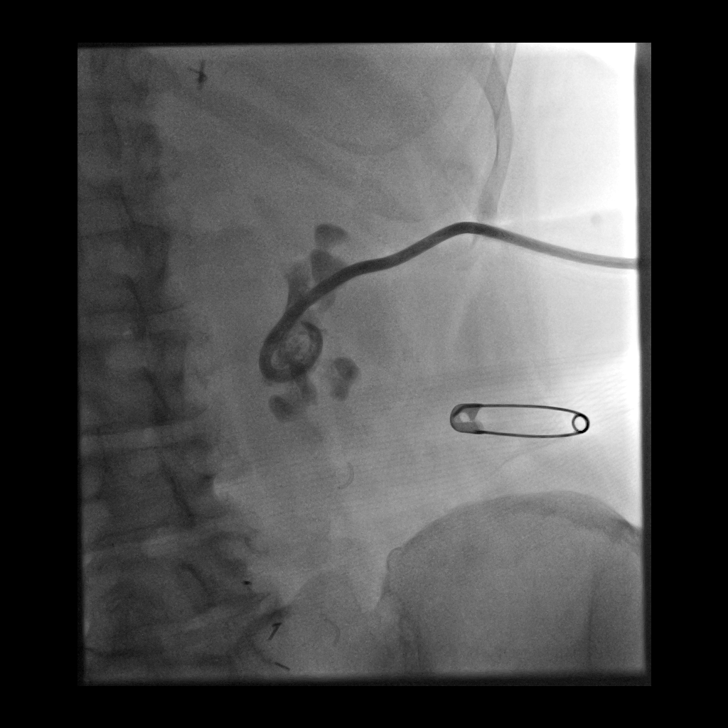
[im 6/7]
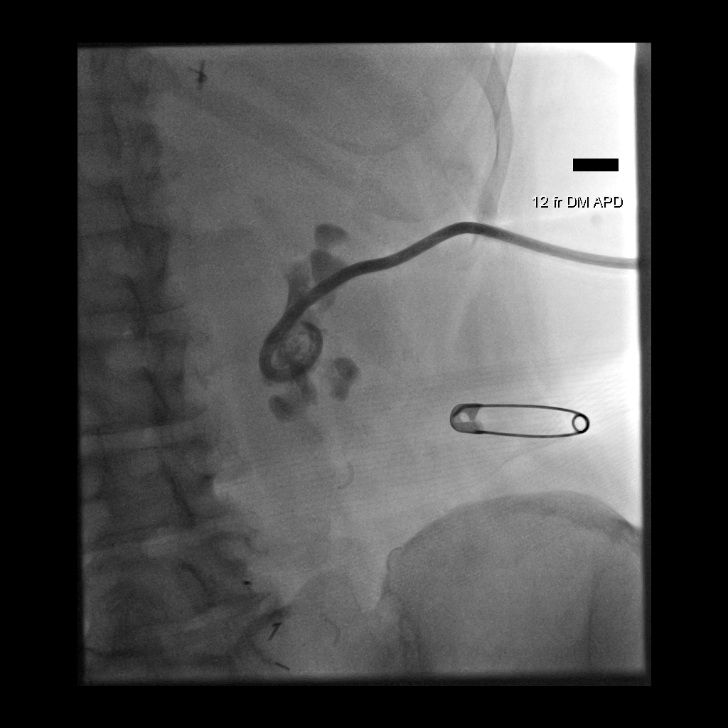
[im 7/7]
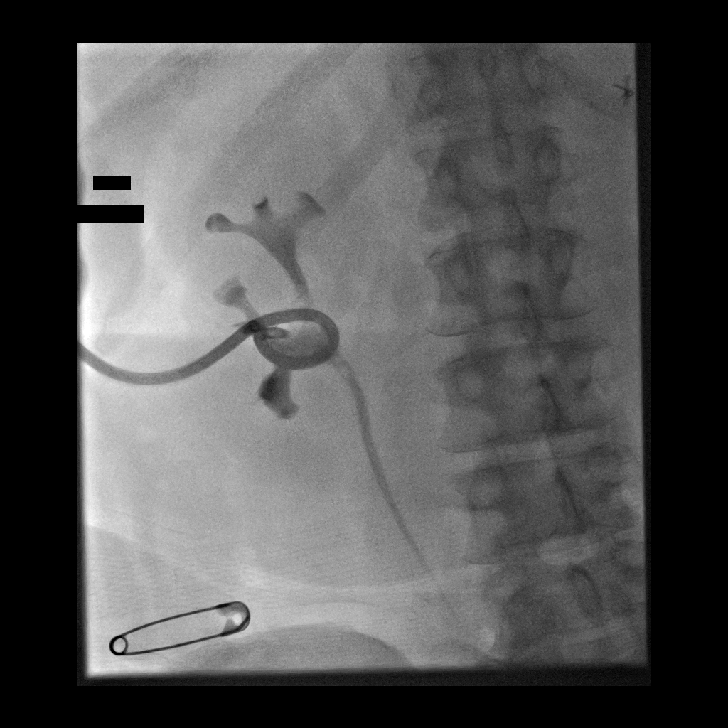

[7 of 7 positions shown; findings below may reference images not displayed]

catheter exchange. Given patient's history discomfort with previous
nephrostomy catheter exchange, today's procedure will be performed
with conscious sedation.

Patient has tolerated the bilateral percutaneous nephrostomy
catheter's very poorly since the time of initial placement.

Additionally, patient has experienced several episodes of inverted
retraction of the left-sided percutaneous nephrostomy catheter and
as such, both nephrostomy catheters were converted from Ali Sopian
Nirvan type catheters to standard 12 French all-purpose drainage
catheters during the time of her last fluoroscopic guided exchange
performed 01/05/2019.

Patient states chest tolerated the left-sided nephrostomy catheter
well though wishes to convert the right-sided nephrostomy catheter
back to Narasimlu Gandham type catheter given persistent right-sided
flank pain.

EXAM:
FLUOROSCOPIC GUIDED BILATERAL SIDED NEPHROSTOMY CATHETER EXCHANGE
CT abdomen pelvis-01/17/2019

CONTRAST:  A total of 15 mL Csovue-YBB administered was administered
into both collecting systems

FLUOROSCOPY TIME:  1 minute, 12 seconds (22 mGy)

COMPLICATIONS:
None immediate.
The bilateral flanks and external portions of existing nephrostomy
catheters were prepped and draped in the usual sterile fashion. A
sterile drape was applied covering the operative field. Maximum
barrier sterile technique with sterile gowns and gloves were used
for the procedure. A timeout was performed prior to the initiation
of the procedure.

A pre procedural spot fluoroscopic image was obtained. Beginning
with the left-sided nephrostomy, a small amount of contrast was
injected via the existing left-sided nephrostomy catheter
demonstrating appropriate positioning within the renal pelvis. The
existing nephrostomy catheter was cut and cannulated with a Benson
wire which was coiled within the renal pelvis. Under intermittent
fluoroscopic guidance, the existing nephrostomy catheter was
exchanged for a new 12 French all-purpose drainage catheter. Limited
contrast injection confirmed appropriate positioning within the left
renal pelvis and a post exchange fluoroscopic image was obtained.
The catheter was locked and reconnected to a gravity bag.

The identical repeat procedure was repeated for the contralateral
right-sided nephrostomy, however the right-sided all purpose
drainage catheter was converted back to Narasimlu Gandham catheter
per patient request. After successful fluoroscopic guided exchange,
the new 12 Aldany Bo Coc type drainage catheter is
appropriately positioned with end coiled and locked within the right
renal pelvis.

Dressings were placed. The patient tolerated the above procedures
well without immediate postprocedural complication.
FINDINGS: The existing nephrostomy catheters are appropriately positioned and
functioning.

After successful fluoroscopic guided exchange, new 12 French
left-sided all-purpose nephrostomy catheter is appropriately
positioned with end coiled and locked within left renal pelvis.

After fluoroscopic guided exchange, the new 12 Aldany Bo Coc
type drainage catheter is appropriately positioned with end coiled
and locked within the right renal pelvis.
IMPRESSION: 1. Successful fluoroscopic guided exchange of left-sided 12 French
all-purpose nephrostomy catheter.
2. Successful fluoroscopic guided exchange and conversion to a
right-sided 12 Aldany Bo Coc type nephrostomy catheter.

PLAN:
- The patient will determine if she tolerates the Ferienhaus Erxleben
catheter better than the standard all-purpose drainage catheter. If
she subjectively notices no improvement with Ferienhaus Erxleben type
catheter and/or the Ferienhaus Erxleben catheter is inadvertently
retracted outside the collecting system prior to the time of her
next fluoroscopic guided exchange, would give strong consideration
to converting the catheter back to a standard 12 French all-purpose
drain.

## 2020-02-09 ENCOUNTER — Other Ambulatory Visit (INDEPENDENT_AMBULATORY_CARE_PROVIDER_SITE_OTHER): Payer: Self-pay | Admitting: Family Medicine

## 2020-02-09 DIAGNOSIS — E119 Type 2 diabetes mellitus without complications: Secondary | ICD-10-CM

## 2020-02-11 DIAGNOSIS — Z4801 Encounter for change or removal of surgical wound dressing: Secondary | ICD-10-CM | POA: Diagnosis not present

## 2020-02-11 DIAGNOSIS — Z936 Other artificial openings of urinary tract status: Secondary | ICD-10-CM | POA: Diagnosis not present

## 2020-02-11 DIAGNOSIS — L89319 Pressure ulcer of right buttock, unspecified stage: Secondary | ICD-10-CM | POA: Diagnosis not present

## 2020-02-11 DIAGNOSIS — Z933 Colostomy status: Secondary | ICD-10-CM | POA: Diagnosis not present

## 2020-02-13 DIAGNOSIS — Z933 Colostomy status: Secondary | ICD-10-CM | POA: Diagnosis not present

## 2020-02-13 DIAGNOSIS — L89319 Pressure ulcer of right buttock, unspecified stage: Secondary | ICD-10-CM | POA: Diagnosis not present

## 2020-02-13 DIAGNOSIS — Z4801 Encounter for change or removal of surgical wound dressing: Secondary | ICD-10-CM | POA: Diagnosis not present

## 2020-02-13 DIAGNOSIS — Z936 Other artificial openings of urinary tract status: Secondary | ICD-10-CM | POA: Diagnosis not present

## 2020-02-15 DIAGNOSIS — L89319 Pressure ulcer of right buttock, unspecified stage: Secondary | ICD-10-CM | POA: Diagnosis not present

## 2020-02-15 DIAGNOSIS — Z936 Other artificial openings of urinary tract status: Secondary | ICD-10-CM | POA: Diagnosis not present

## 2020-02-15 DIAGNOSIS — Z933 Colostomy status: Secondary | ICD-10-CM | POA: Diagnosis not present

## 2020-02-15 DIAGNOSIS — Z4801 Encounter for change or removal of surgical wound dressing: Secondary | ICD-10-CM | POA: Diagnosis not present

## 2020-02-18 ENCOUNTER — Other Ambulatory Visit: Payer: Self-pay

## 2020-02-18 ENCOUNTER — Ambulatory Visit (INDEPENDENT_AMBULATORY_CARE_PROVIDER_SITE_OTHER): Payer: BC Managed Care – PPO | Admitting: Family Medicine

## 2020-02-18 ENCOUNTER — Encounter (INDEPENDENT_AMBULATORY_CARE_PROVIDER_SITE_OTHER): Payer: Self-pay | Admitting: Family Medicine

## 2020-02-18 VITALS — BP 130/74 | HR 57 | Temp 97.9°F | Ht 62.0 in | Wt 198.0 lb

## 2020-02-18 DIAGNOSIS — E1169 Type 2 diabetes mellitus with other specified complication: Secondary | ICD-10-CM

## 2020-02-18 DIAGNOSIS — G4709 Other insomnia: Secondary | ICD-10-CM | POA: Diagnosis not present

## 2020-02-18 DIAGNOSIS — Z6836 Body mass index (BMI) 36.0-36.9, adult: Secondary | ICD-10-CM | POA: Diagnosis not present

## 2020-02-18 MED ORDER — RYBELSUS 7 MG PO TABS: 1.0000 | ORAL_TABLET | Freq: Every day | ORAL | 0 refills | Status: DC

## 2020-02-20 DIAGNOSIS — E872 Acidosis: Secondary | ICD-10-CM | POA: Diagnosis not present

## 2020-02-20 DIAGNOSIS — I10 Essential (primary) hypertension: Secondary | ICD-10-CM | POA: Diagnosis not present

## 2020-02-20 DIAGNOSIS — Z9889 Other specified postprocedural states: Secondary | ICD-10-CM | POA: Diagnosis not present

## 2020-02-20 DIAGNOSIS — N1832 Chronic kidney disease, stage 3b: Secondary | ICD-10-CM | POA: Diagnosis not present

## 2020-02-20 DIAGNOSIS — E119 Type 2 diabetes mellitus without complications: Secondary | ICD-10-CM | POA: Diagnosis not present

## 2020-02-20 DIAGNOSIS — N1339 Other hydronephrosis: Secondary | ICD-10-CM | POA: Diagnosis not present

## 2020-02-20 NOTE — Progress Notes (Signed)
Chief Complaint:   OBESITY [name] is here to discuss her progress with her obesity treatment plan along with follow-up of her obesity related diagnoses. [name] is on keeping a food journal and adhering to recommended goals of 1100-1300 calories and 75+ grams of protein daily and states she is following her eating plan approximately 50% of the time. [name] states she is doing 0 minutes 0 times per week.  Today's visit was #: 62 Starting weight: 208 lbs Starting date: 04/05/2018 Today's weight: 198 lbs Today's date: 02/18/2020 Total lbs lost to date: 10 Total lbs lost since last in-office visit: 0  Interim History: [name] has been struggling more with hunger and she notes her husband's preferences makes eating healthy more difficult. She is also struggling with meal planning.  Subjective:   1. Type 2 diabetes mellitus with other specified complication, without long-term current use of insulin ([name]) [name] notes increased polyphagia recently and she has hed increased BGs fluctuations.  2. Other insomnia [name] has been taking melatonin 10 mg qhs, and she feels it has helped her sleep significantly. She denies daytime somnolence.   Assessment/Plan:   1. Type 2 diabetes mellitus with other specified complication, without long-term current use of insulin (HCC) Good blood sugar control is important to decrease the likelihood of diabetic complications such as nephropathy, neuropathy, limb loss, blindness, coronary artery disease, and death. Intensive lifestyle modification including diet, exercise and weight loss are the first line of treatment for diabetes. [name] agreed to increase Rybelsus to 7 mg q daily, and we will refill for 90 days with no refills.  - Semaglutide (RYBELSUS) 7 MG TABS; Take 1 tablet by mouth daily.  Dispense: 90 tablet; Refill: 0  2. Other insomnia The problem of recurrent insomnia was discussed. [name] is to continue melatonin, and she is ok to decrease to 5 mg when she  is ready. Orders and follow up as documented in patient record. Counseling: Intensive lifestyle modifications are the first line treatment for this issue. We discussed several lifestyle modifications today and she will continue to work on diet, exercise and weight loss efforts.   3. Class 2 severe obesity with serious comorbidity and body mass index (BMI) of 36.0 to 36.9 in adult, unspecified obesity type (HCC) [name] is currently in the action stage of change. As such, her goal is to continue with weight loss efforts. She has agreed to keeping a food journal and adhering to recommended goals of 1100-1300 calories and 75+ grams of protein daily.   Behavioral modification strategies: meal planning and cooking strategies and dealing with family or coworker sabotage.  [name] has agreed to follow-up with our clinic in 2 to 3 weeks. She was informed of the importance of frequent follow-up visits to maximize her success with intensive lifestyle modifications for her multiple health conditions.   Objective:   Blood pressure 130/74, pulse (!) 57, temperature 97.9 F (36.6 C), temperature source Oral, height 5\' 2"  (1.575 m), weight 198 lb (89.8 kg), SpO2 100 %. Body mass index is 36.21 kg/m.  General: Cooperative, alert, well developed, in no acute distress. HEENT: Conjunctivae and lids unremarkable. Cardiovascular: Regular rhythm.  Lungs: Normal work of breathing. Neurologic: No focal deficits.   Lab Results  Component Value Date   CREATININE 1.91 (H) 01/18/2020   BUN 33 (H) 01/18/2020   NA 139 01/18/2020   K 4.4 01/18/2020   CL 106 01/18/2020   CO2 23 01/18/2020   Lab Results  Component Value Date  ALT 18 01/18/2020   AST 14 01/18/2020   ALKPHOS 174 (H) 10/02/2019   BILITOT 0.3 01/18/2020   Lab Results  Component Value Date   HGBA1C 7.9 (H) 09/01/2017   HGBA1C 5.3 04/01/2017   HGBA1C 5.8 (H) 09/05/2015   HGBA1C 6.8 (H) 11/21/2014   HGBA1C 6.3 (H) 11/07/2014   No results found  for: INSULIN Lab Results  Component Value Date   TSH 3.38 01/18/2020   Lab Results  Component Value Date   CHOL 70 01/18/2020   HDL 36 (L) 01/18/2020   LDLCALC 13 01/18/2020   TRIG 120 01/18/2020   CHOLHDL 1.9 01/18/2020   Lab Results  Component Value Date   WBC 3.6 (L) 01/18/2020   HGB 12.1 01/18/2020   HCT 37.8 01/18/2020   MCV 95.2 01/18/2020   PLT 241 01/18/2020   Lab Results  Component Value Date   IRON 7 (L) 08/31/2017   TIBC 164 (L) 08/31/2017   FERRITIN 27 11/29/2014    Obesity Behavioral Intervention Documentation for Insurance:   Approximately 15 minutes were spent on the discussion below.  ASK: We discussed the diagnosis of obesity with [name] today and [name] agreed to give [place] permission to discuss obesity behavioral modification therapy today.  ASSESS: [name] has the diagnosis of obesity and her BMI today is 36.21. [name] is in the action stage of change.   ADVISE: [name] was educated on the multiple health risks of obesity as well as the benefit of weight loss to improve her health. She was advised of the need for long term treatment and the importance of lifestyle modifications to improve her current health and to decrease her risk of future health problems.  AGREE: Multiple dietary modification options and treatment options were discussed and [name] agreed to follow the recommendations documented in the above note.  ARRANGE: [name] was educated on the importance of frequent visits to treat obesity as outlined per CMS and USPSTF guidelines and agreed to schedule her next follow up appointment today.  Attestation Statements:   Reviewed by clinician on day of visit: allergies, medications, problem list, medical history, surgical history, family history, social history, and previous encounter notes.   I, Trixie Dredge, am acting as transcriptionist for Dennard Nip, MD.  I have reviewed the above documentation for accuracy and completeness, and I agree with  the above. -  Dennard Nip, MD

## 2020-03-02 IMAGING — US US BREAST*R* LIMITED INC AXILLA
1 series · 13 of 15 positions shown · non-contrast
Comparison: None.

CLINICAL DATA: 63-year-old female with history of left breast
mastectomy for cancer and prophylactic right breast mastectomy in
2681. This was followed by tissue expander placement and
subsequently placement of bilateral breast implants along with
lipofilling. The patient noticed 2 palpable areas in the right
breast following her reconstruction. She states that these have
fluctuated in their prominent over the past 3 years, but have not
definitely increased in size.

EXAM:
ULTRASOUND OF THE RIGHT BREAST

[Series 1: us breast*right* limited inc axilla · 0.05mm/px · 13 of 15 slices shown]
[im 1/15]
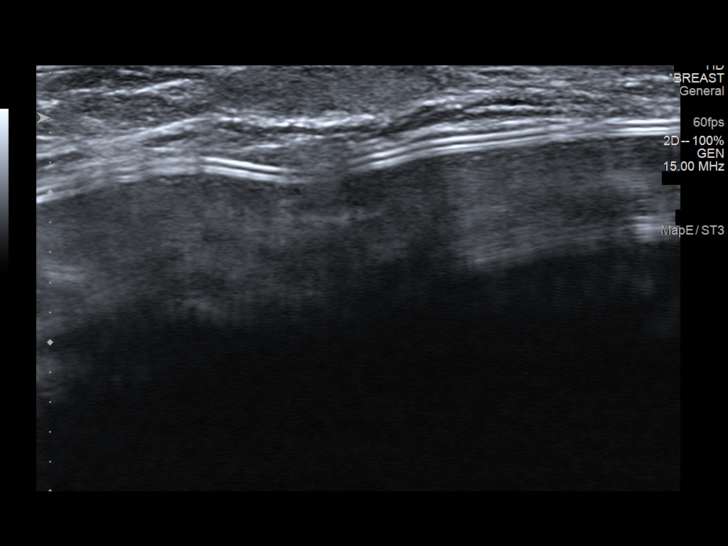
[im 2/15]
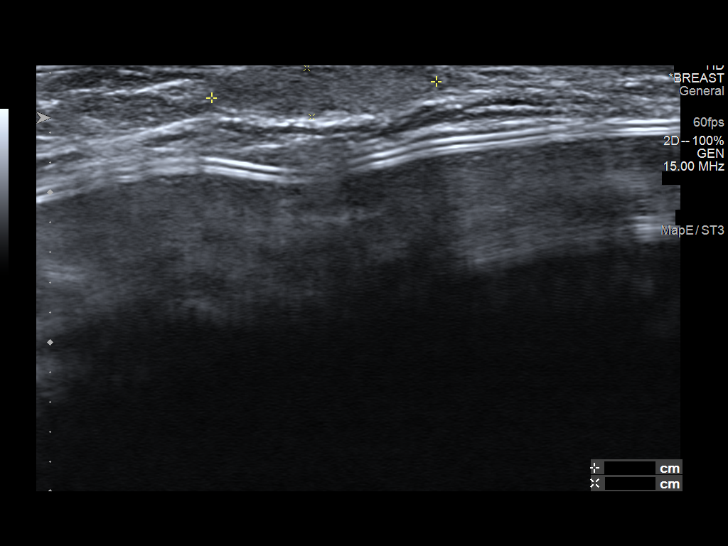
[im 3/15]
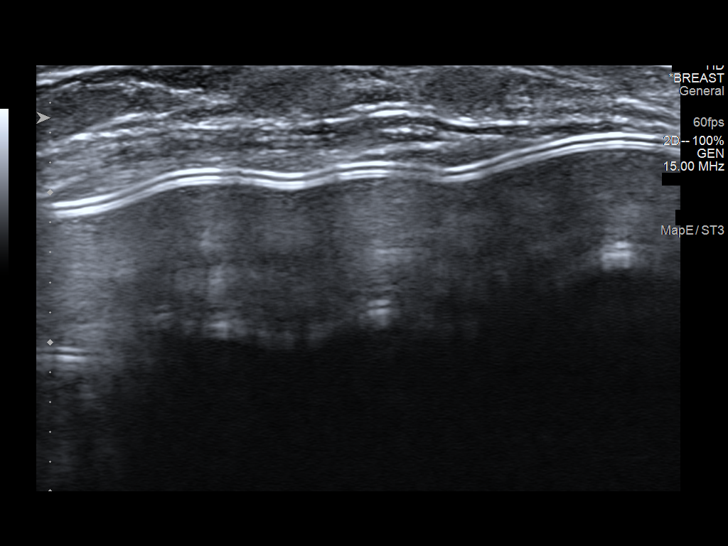
[im 5/15]
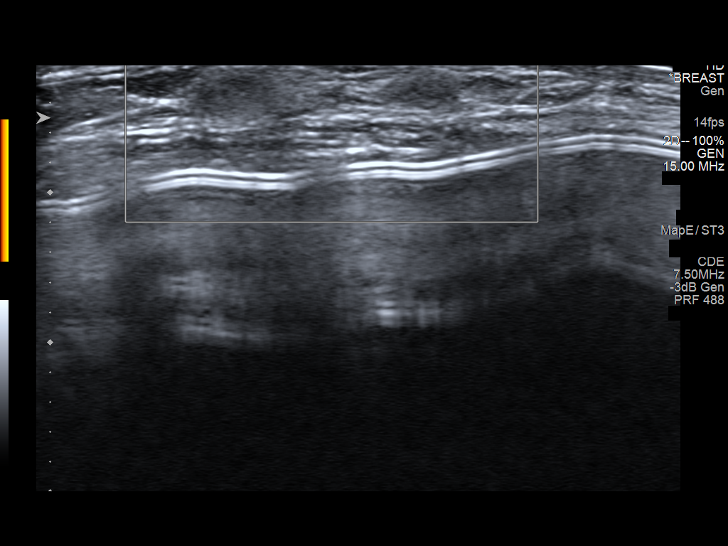
[im 6/15]
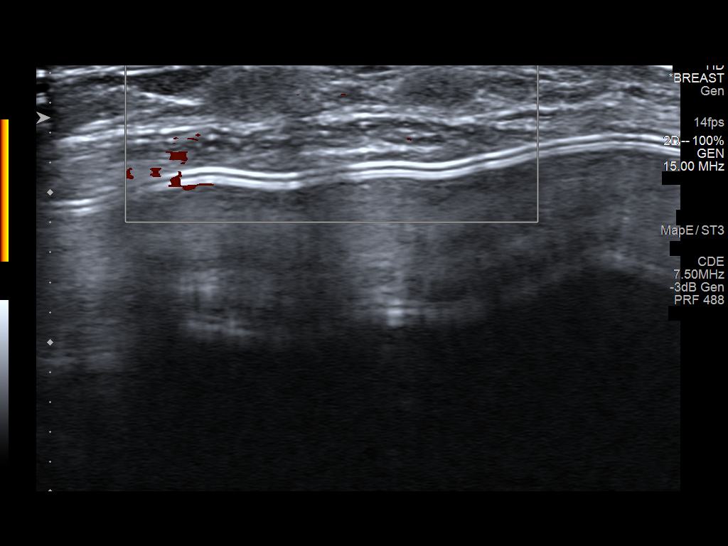
[im 7/15]
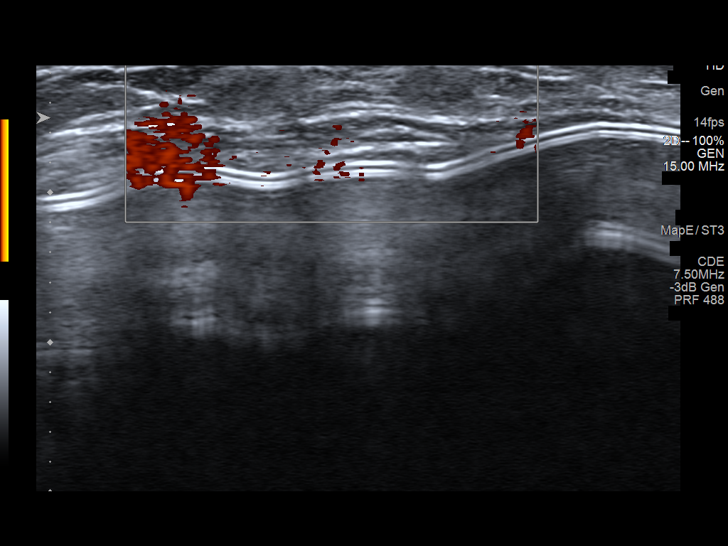
[im 8/15]
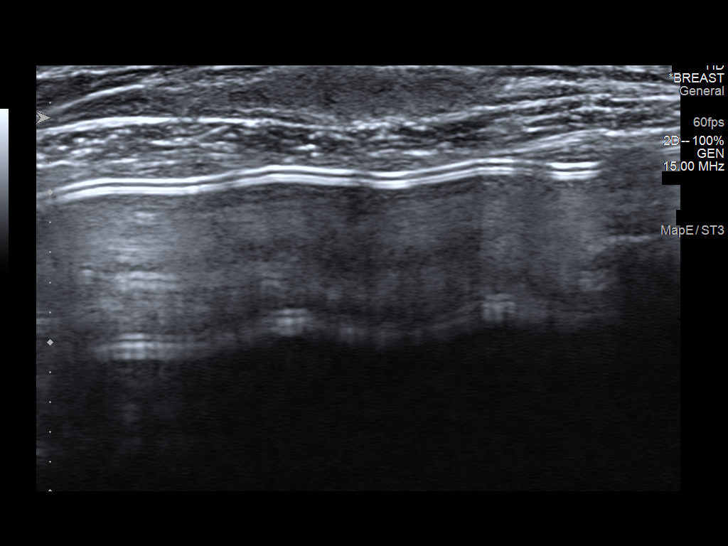
[im 9/15]
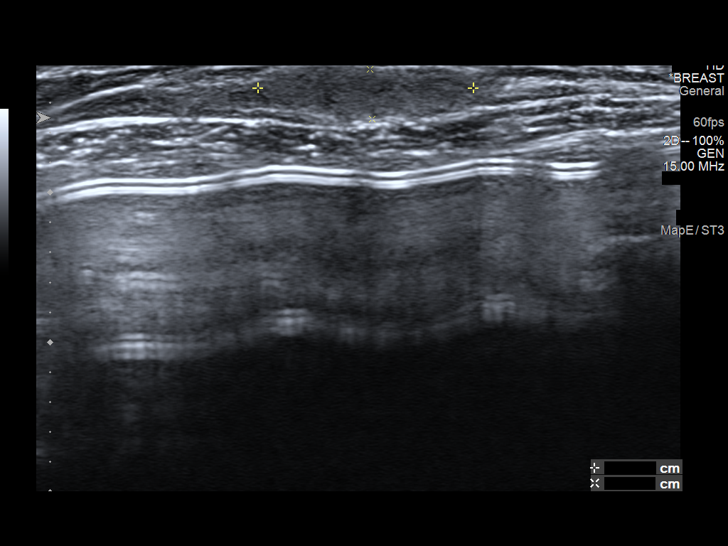
[im 10/15]
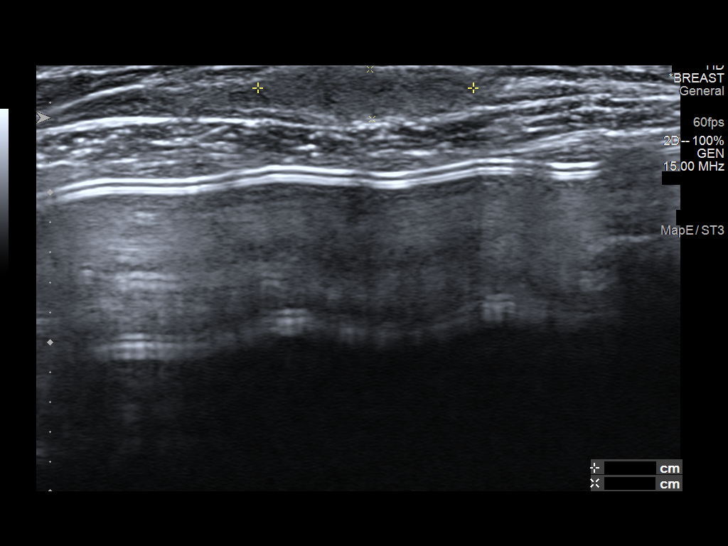
[im 11/15]
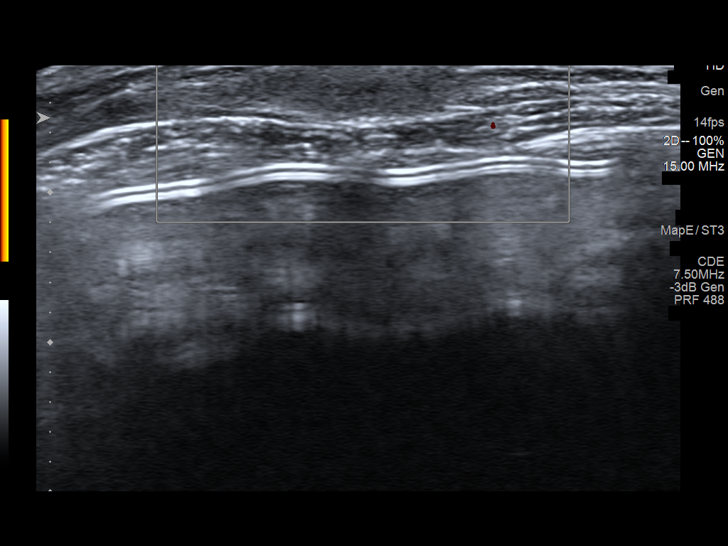
[im 13/15]
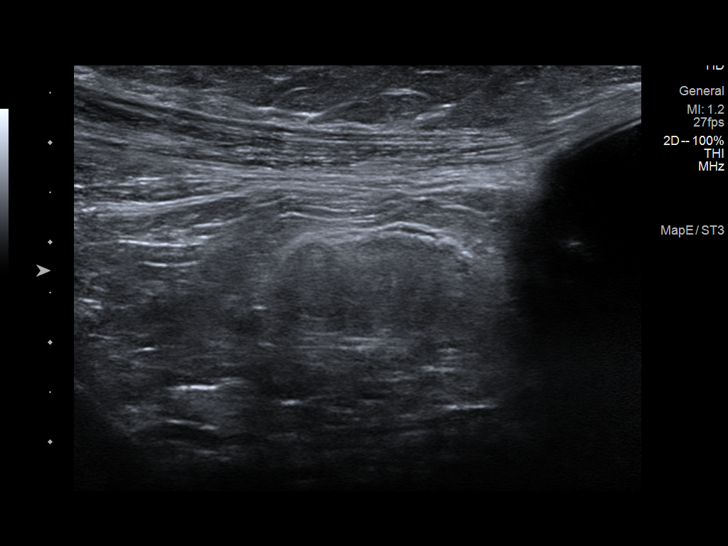
[im 14/15]
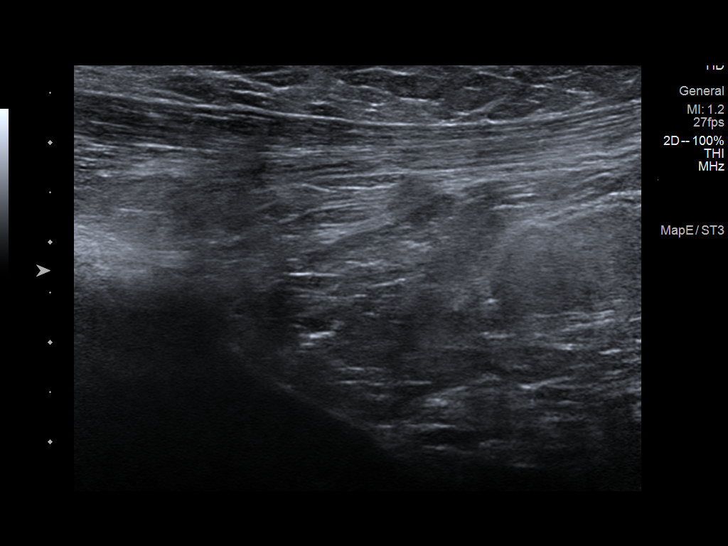
[im 15/15]
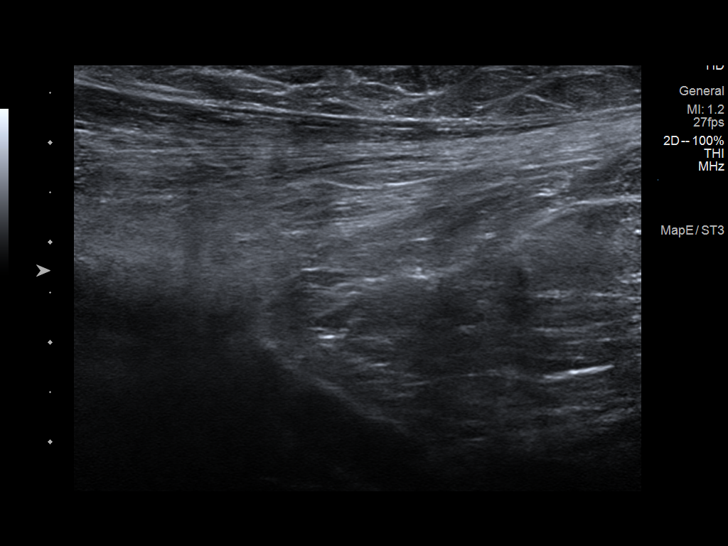

[13 of 15 positions shown; findings below may reference images not displayed]

FINDINGS: On physical exam, there are 2 small adjacent firm palpable masses in
the lower-inner right breast.

Targeted ultrasound is performed of the right breast, showing two
adjacent hypoechoic masses, one at 4 o'clock, 2 cm from the nipple
and the other at 4 o'clock 1 cm from the nipple. The mass at 4
o'clock, 2 cm from the nipple measures 1.5 x 0.3 x 0.6 cm, and the
other at 1 cm from the nipple measures 1.4 x 0.3 x 0.6 cm. No
suspicious lymph nodes are identified in the right axilla.
IMPRESSION: 1. There are 2 adjacent probably benign masses in the right breast,
favored to represent fat necrosis.

2.  No evidence of right axillary lymphadenopathy.

RECOMMENDATION:
The patient has an upcoming surgery and would like to have these
palpable areas addressed prior to surgery. Therefore, we will
proceed with ultrasound-guided biopsy. Due to the proximity of the
masses and the similarity of their appearance, these can be biopsied
together as one site.

I have discussed the findings and recommendations with the patient.
Results were also provided in writing at the conclusion of the
visit. If applicable, a reminder letter will be sent to the patient
regarding the next appointment.

BI-RADS CATEGORY  3: Probably benign.

## 2020-03-05 IMAGING — US US BREAST BX W LOC DEV 1ST LESION IMG BX SPEC US GUIDE*R*
1 series · 8 of 8 positions shown · non-contrast
Comparison: Previous exam(s).
COMPARISON: Previous exam(s).

Addendum:
CLINICAL DATA: Biopsy right breast mass

EXAM:
ULTRASOUND GUIDED RIGHT BREAST CORE NEEDLE BIOPSY

[Series 1: us breast bx w loc dev 1st lesion img bx spec us g · 0.07mm/px · 8 of 8 slices shown]
[im 1/8]
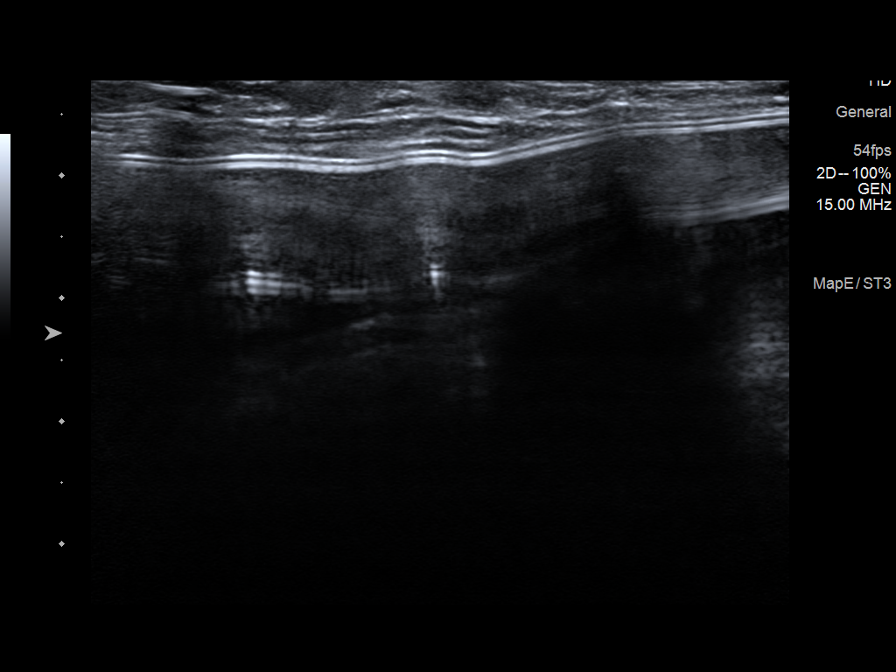
[im 2/8]
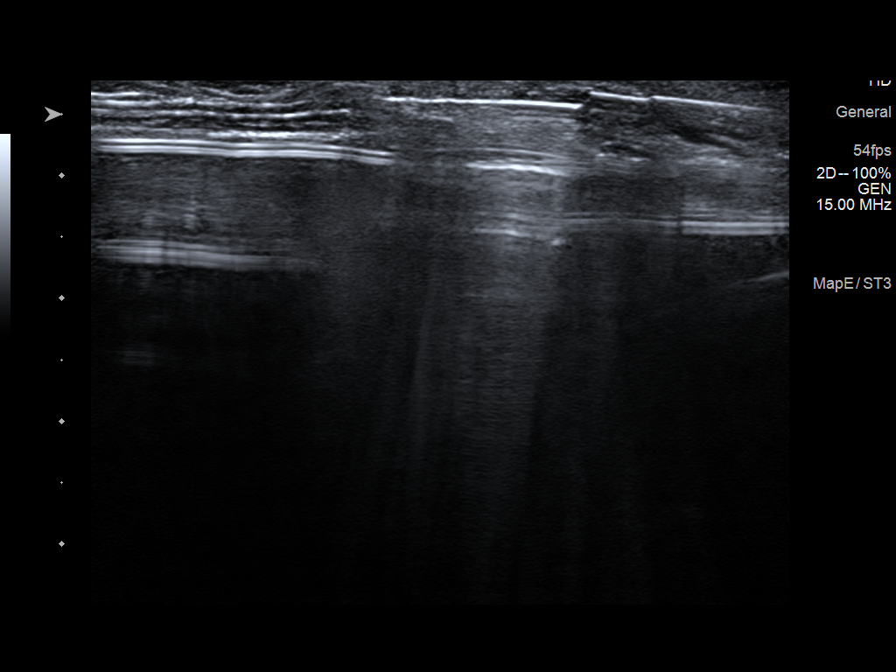
[im 3/8]
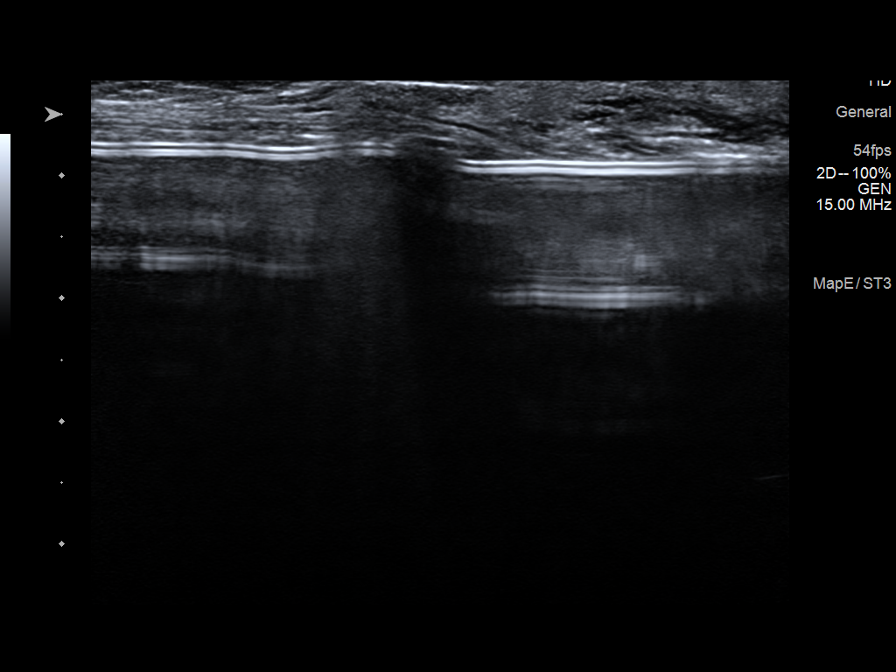
[im 4/8]
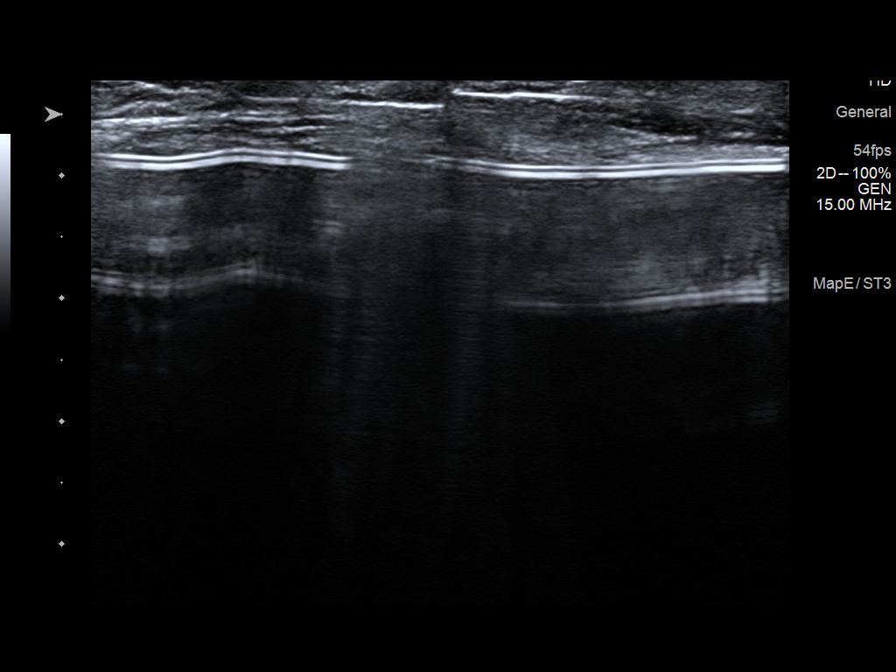
[im 5/8]
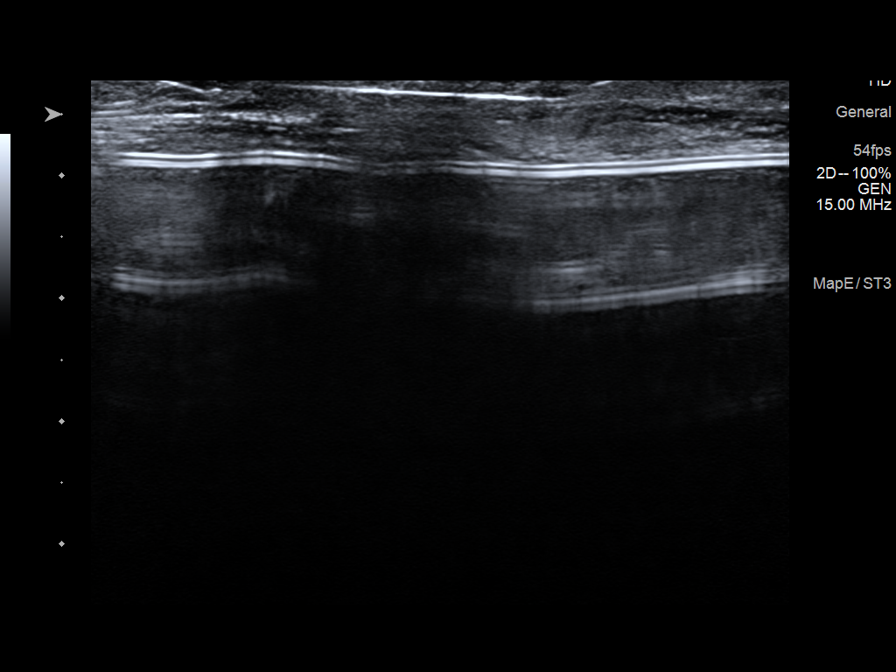
[im 6/8]
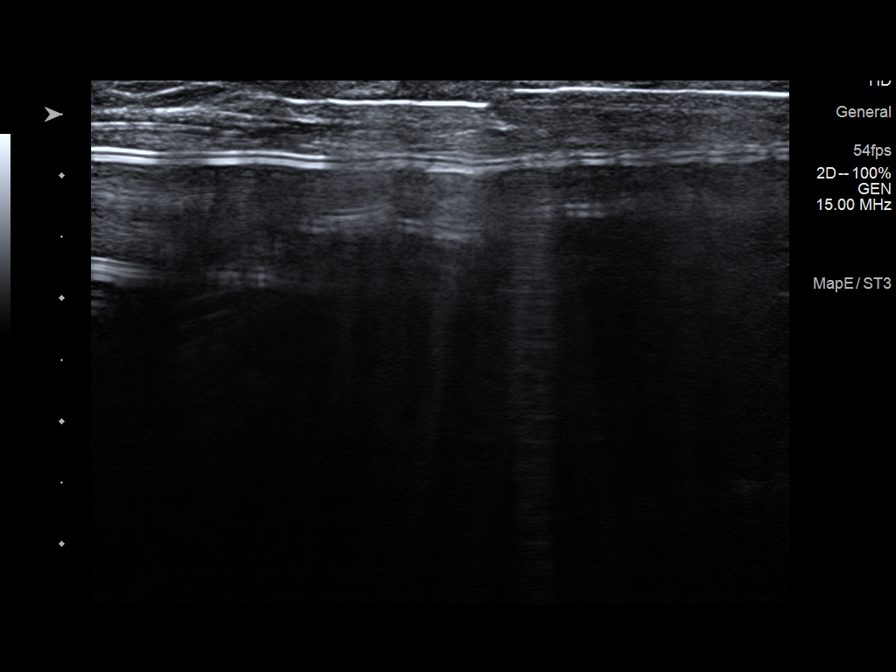
[im 7/8]
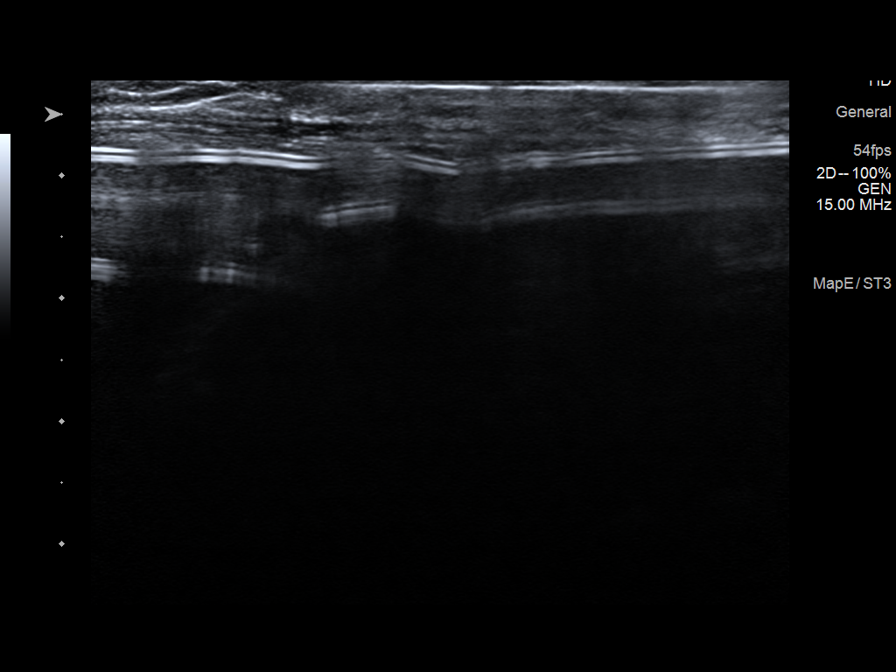
[im 8/8]
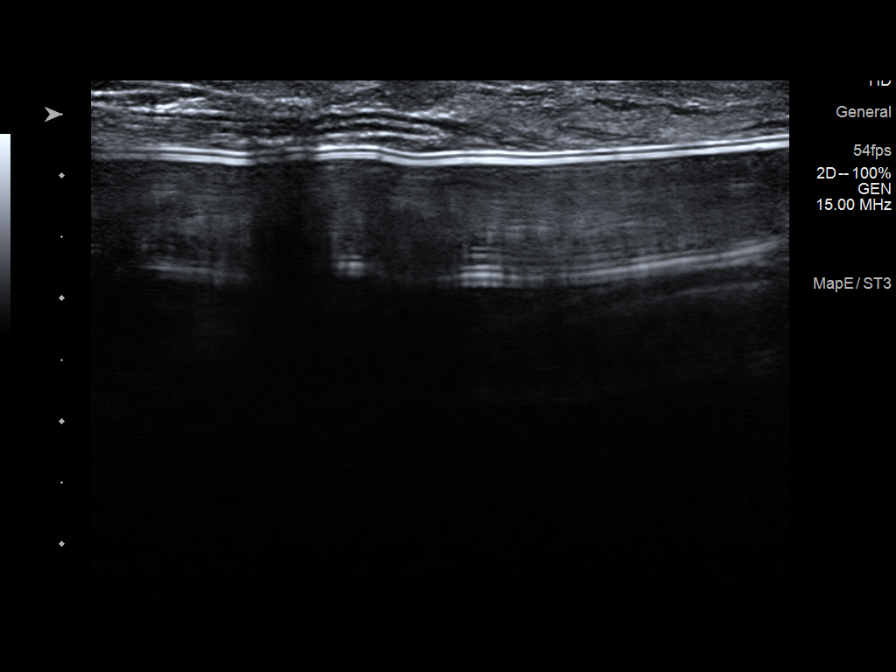

[8 of 8 positions shown; findings below may reference images not displayed]

FINDINGS: I met with the patient and we discussed the procedure of
ultrasound-guided biopsy, including benefits and alternatives. We
discussed the high likelihood of a successful procedure. We
discussed the risks of the procedure, including infection, bleeding,
tissue injury, clip migration, and inadequate sampling. Informed
written consent was given. The usual time-out protocol was performed
immediately prior to the procedure.

Lesion quadrant: Lower inner quadrant

Using sterile technique and 1% Lidocaine as local anesthetic, under
direct ultrasound visualization, a 14 gauge Mccue device was
used to perform biopsy of 2 adjacent right breast masses at 4
o'clock, 1 cm from the nipple using a medial approach. At the
conclusion of the procedure a HydroMARK tissue marker clip was
deployed into the biopsy cavity. Follow up 2 view mammogram was
performed and dictated separately.
IMPRESSION: Ultrasound guided biopsy of a right breast mass at 4 o'clock, 1 cm
from the nipple. No apparent complications.

ADDENDUM:
Pathology revealed ORGANIZING FAT NECROSIS WITH CALCIFICATIONS of
the Right reconstructed breast, 4 o'clock. This was found to be
concordant by Dr. Sergey Bella.

Pathology results were discussed with the patient by telephone. The
patient reported doing well after the biopsy with tenderness at the
site. Post biopsy instructions and care were reviewed and questions
were answered. The patient was encouraged to call The [REDACTED]

The patient was instructed to continue with monthly self breast
examinations, of her reconstructed breasts, and to have clinical
follow-up as needed.

Pathology results reported by Ashar Audrey, RN on 03/16/2019.

*** End of Addendum ***
FINDINGS: I met with the patient and we discussed the procedure of
ultrasound-guided biopsy, including benefits and alternatives. We
discussed the high likelihood of a successful procedure. We
discussed the risks of the procedure, including infection, bleeding,
tissue injury, clip migration, and inadequate sampling. Informed
written consent was given. The usual time-out protocol was performed
immediately prior to the procedure.

Lesion quadrant: Lower inner quadrant

Using sterile technique and 1% Lidocaine as local anesthetic, under
direct ultrasound visualization, a 14 gauge Mccue device was
used to perform biopsy of 2 adjacent right breast masses at 4
o'clock, 1 cm from the nipple using a medial approach. At the
conclusion of the procedure a HydroMARK tissue marker clip was
deployed into the biopsy cavity. Follow up 2 view mammogram was
performed and dictated separately.
IMPRESSION: Ultrasound guided biopsy of a right breast mass at 4 o'clock, 1 cm
from the nipple. No apparent complications.

## 2020-03-06 ENCOUNTER — Encounter (INDEPENDENT_AMBULATORY_CARE_PROVIDER_SITE_OTHER): Payer: Self-pay | Admitting: Family Medicine

## 2020-03-06 ENCOUNTER — Other Ambulatory Visit: Payer: Self-pay

## 2020-03-06 ENCOUNTER — Ambulatory Visit (INDEPENDENT_AMBULATORY_CARE_PROVIDER_SITE_OTHER): Payer: BC Managed Care – PPO | Admitting: Family Medicine

## 2020-03-06 VITALS — BP 113/73 | HR 63 | Temp 97.8°F | Ht 62.0 in | Wt 203.0 lb

## 2020-03-06 DIAGNOSIS — E1169 Type 2 diabetes mellitus with other specified complication: Secondary | ICD-10-CM

## 2020-03-06 DIAGNOSIS — Z6837 Body mass index (BMI) 37.0-37.9, adult: Secondary | ICD-10-CM

## 2020-03-06 NOTE — Progress Notes (Signed)
Chief Complaint:   OBESITY Tawnia is here to discuss her progress with her obesity treatment plan along with follow-up of her obesity related diagnoses. [place] is on keeping a food journal and adhering to recommended goals of 1100-1300 calories and 75+ grams of protein daily and states she is following her eating plan approximately 60% of the time. [place] states she is doing 0 minutes 0 times per week.  Today's visit was #: 71 Starting weight: 208 lbs Starting date: 04/05/2018 Today's weight: 203 lbs Today's date: 03/06/2020 Total lbs lost to date: 5 Total lbs lost since last in-office visit: 0  Interim History: Cass is retaining some fluid today but she has still been mindful of her food choices. She is going on vacation soon and she is planning on making better choices as well.  Subjective:   1. Type 2 diabetes mellitus with other specified complication, without long-term current use of insulin ([place]) Iyanni is stable on Rybelsus and she is working on diet, and she denies nausea or vomiting. She recently increase her dose and feels her polyphagia is better controlled.  Assessment/Plan:   1. Type 2 diabetes mellitus with other specified complication, without long-term current use of insulin (HCC) Good blood sugar control is important to decrease the likelihood of diabetic complications such as nephropathy, neuropathy, limb loss, blindness, coronary artery disease, and death. Intensive lifestyle modification including diet, exercise and weight loss are the first line of treatment for diabetes. Shekelia will continue Rybelsus and her diet prescription, and will continue to follow up as directed.  2. Class 2 severe obesity with serious comorbidity and body mass index (BMI) of 37.0 to 37.9 in adult, unspecified obesity type (HCC) Shamonica is currently in the action stage of change. As such, her goal is to continue with weight loss efforts. She has agreed to keeping a food journal and adhering to  recommended goals of 1100-1300 calories and 75+ grams of protein daily.   Behavioral modification strategies: increasing lean protein intake, decreasing simple carbohydrates, holiday eating strategies  and celebration eating strategies.  Ellinore has agreed to follow-up with our clinic in 3 to 4 weeks. She was informed of the importance of frequent follow-up visits to maximize her success with intensive lifestyle modifications for her multiple health conditions.   Objective:   Blood pressure 113/73, pulse 63, temperature 97.8 F (36.6 C), temperature source Oral, height 5\' 2"  (1.575 m), weight 203 lb (92.1 kg), SpO2 98 %. Body mass index is 37.13 kg/m.  General: Cooperative, alert, well developed, in no acute distress. HEENT: Conjunctivae and lids unremarkable. Cardiovascular: Regular rhythm.  Lungs: Normal work of breathing. Neurologic: No focal deficits.   Lab Results  Component Value Date   CREATININE 1.91 (H) 01/18/2020   BUN 33 (H) 01/18/2020   NA 139 01/18/2020   K 4.4 01/18/2020   CL 106 01/18/2020   CO2 23 01/18/2020   Lab Results  Component Value Date   ALT 18 01/18/2020   AST 14 01/18/2020   ALKPHOS 174 (H) 10/02/2019   BILITOT 0.3 01/18/2020   Lab Results  Component Value Date   HGBA1C 7.9 (H) 09/01/2017   HGBA1C 5.3 04/01/2017   HGBA1C 5.8 (H) 09/05/2015   HGBA1C 6.8 (H) 11/21/2014   HGBA1C 6.3 (H) 11/07/2014   No results found for: INSULIN Lab Results  Component Value Date   TSH 3.38 01/18/2020   Lab Results  Component Value Date   CHOL 70 01/18/2020   HDL 36 (L)  01/18/2020   LDLCALC 13 01/18/2020   TRIG 120 01/18/2020   CHOLHDL 1.9 01/18/2020   Lab Results  Component Value Date   WBC 3.6 (L) 01/18/2020   HGB 12.1 01/18/2020   HCT 37.8 01/18/2020   MCV 95.2 01/18/2020   PLT 241 01/18/2020   Lab Results  Component Value Date   IRON 7 (L) 08/31/2017   TIBC 164 (L) 08/31/2017   FERRITIN 27 11/29/2014   Attestation Statements:   Reviewed  by clinician on day of visit: allergies, medications, problem list, medical history, surgical history, family history, social history, and previous encounter notes.  Time spent on visit including pre-visit chart review and post-visit care and charting was 32 minutes.    I, Trixie Dredge, am acting as transcriptionist for Dennard Nip, MD.  I have reviewed the above documentation for accuracy and completeness, and I agree with the above. -  Dennard Nip, MD

## 2020-03-10 DIAGNOSIS — Z936 Other artificial openings of urinary tract status: Secondary | ICD-10-CM | POA: Diagnosis not present

## 2020-03-10 DIAGNOSIS — Z4801 Encounter for change or removal of surgical wound dressing: Secondary | ICD-10-CM | POA: Diagnosis not present

## 2020-03-10 DIAGNOSIS — Z933 Colostomy status: Secondary | ICD-10-CM | POA: Diagnosis not present

## 2020-03-10 DIAGNOSIS — L89319 Pressure ulcer of right buttock, unspecified stage: Secondary | ICD-10-CM | POA: Diagnosis not present

## 2020-03-11 DIAGNOSIS — K604 Rectal fistula: Secondary | ICD-10-CM | POA: Diagnosis not present

## 2020-03-11 DIAGNOSIS — N321 Vesicointestinal fistula: Secondary | ICD-10-CM | POA: Diagnosis not present

## 2020-03-17 DIAGNOSIS — E782 Mixed hyperlipidemia: Secondary | ICD-10-CM | POA: Diagnosis not present

## 2020-03-17 DIAGNOSIS — E119 Type 2 diabetes mellitus without complications: Secondary | ICD-10-CM | POA: Diagnosis not present

## 2020-03-17 DIAGNOSIS — E039 Hypothyroidism, unspecified: Secondary | ICD-10-CM | POA: Diagnosis not present

## 2020-03-19 DIAGNOSIS — E119 Type 2 diabetes mellitus without complications: Secondary | ICD-10-CM | POA: Diagnosis not present

## 2020-03-19 DIAGNOSIS — E782 Mixed hyperlipidemia: Secondary | ICD-10-CM | POA: Diagnosis not present

## 2020-03-19 DIAGNOSIS — I129 Hypertensive chronic kidney disease with stage 1 through stage 4 chronic kidney disease, or unspecified chronic kidney disease: Secondary | ICD-10-CM | POA: Diagnosis not present

## 2020-03-19 DIAGNOSIS — E1122 Type 2 diabetes mellitus with diabetic chronic kidney disease: Secondary | ICD-10-CM | POA: Diagnosis not present

## 2020-03-24 DIAGNOSIS — E119 Type 2 diabetes mellitus without complications: Secondary | ICD-10-CM | POA: Diagnosis not present

## 2020-03-24 DIAGNOSIS — H52203 Unspecified astigmatism, bilateral: Secondary | ICD-10-CM | POA: Diagnosis not present

## 2020-03-24 DIAGNOSIS — H2513 Age-related nuclear cataract, bilateral: Secondary | ICD-10-CM | POA: Diagnosis not present

## 2020-04-03 ENCOUNTER — Ambulatory Visit (INDEPENDENT_AMBULATORY_CARE_PROVIDER_SITE_OTHER): Payer: BC Managed Care – PPO | Admitting: Family Medicine

## 2020-04-03 ENCOUNTER — Encounter (INDEPENDENT_AMBULATORY_CARE_PROVIDER_SITE_OTHER): Payer: Self-pay | Admitting: Family Medicine

## 2020-04-03 ENCOUNTER — Other Ambulatory Visit: Payer: Self-pay

## 2020-04-03 VITALS — BP 107/73 | HR 73 | Temp 98.1°F | Ht 62.0 in | Wt 200.0 lb

## 2020-04-03 DIAGNOSIS — Z6836 Body mass index (BMI) 36.0-36.9, adult: Secondary | ICD-10-CM | POA: Diagnosis not present

## 2020-04-03 DIAGNOSIS — E1169 Type 2 diabetes mellitus with other specified complication: Secondary | ICD-10-CM | POA: Diagnosis not present

## 2020-04-03 NOTE — Progress Notes (Signed)
Chief Complaint:   OBESITY [name] is here to discuss her progress with her obesity treatment plan along with follow-up of her obesity related diagnoses. [name] is on keeping a food journal and adhering to recommended goals of 1100-1300 calories and 75+ grams of protein daily and states she is following her eating plan approximately 75% of the time. [name] states she was walking and in the pool and ocean for 3 hours 1 week.   Today's visit was #: 71 Starting weight: 208 lbs Starting date: 04/05/2018 Today's weight: 200 lbs Today's date: 04/03/2020 Total lbs lost to date: 8 Total lbs lost since last in-office visit: 3  Interim History: [name] continues to well with weight loss. She has had a lot of GI issues with possible food poisoning. She had been on vacation and she had gained some but has gotten back on track.  Subjective:   1. Type 2 diabetes mellitus with other specified complication, without long-term current use of insulin (HCC) [name] recently increased Rybelsus to 7 mg and she notes decreased polyphagia. She has had increased GI issues that do not appear to be related to the medicine. She denies hypoglycemia.  Assessment/Plan:   1. Type 2 diabetes mellitus with other specified complication, without long-term current use of insulin (HCC) Good blood sugar control is important to decrease the likelihood of diabetic complications such as nephropathy, neuropathy, limb loss, blindness, coronary artery disease, and death. Intensive lifestyle modification including diet, exercise and weight loss are the first line of treatment for diabetes. [name] will continue diet, exercise, and weight loss, and we will recheck labs in 1-2 months.  2. Class 2 severe obesity with serious comorbidity and body mass index (BMI) of 36.0 to 36.9 in adult, unspecified obesity type (HCC) [name] is currently in the action stage of change. As such, her goal is to continue with weight loss efforts. She has agreed to  keeping a food journal and adhering to recommended goals of 1100-1300 calories and 75+ grams of protein daily.   Behavioral modification strategies: increasing lean protein intake and holiday eating strategies .  [name] has agreed to follow-up with our clinic in 3 to 4 weeks. She was informed of the importance of frequent follow-up visits to maximize her success with intensive lifestyle modifications for her multiple health conditions.   Objective:   Blood pressure 107/73, pulse 73, temperature 98.1 F (36.7 C), height 5\' 2"  (1.575 m), weight 200 lb (90.7 kg), SpO2 98 %. Body mass index is 36.58 kg/m.  General: Cooperative, alert, well developed, in no acute distress. HEENT: Conjunctivae and lids unremarkable. Cardiovascular: Regular rhythm.  Lungs: Normal work of breathing. Neurologic: No focal deficits.   Lab Results  Component Value Date   CREATININE 1.91 (H) 01/18/2020   BUN 33 (H) 01/18/2020   NA 139 01/18/2020   K 4.4 01/18/2020   CL 106 01/18/2020   CO2 23 01/18/2020   Lab Results  Component Value Date   ALT 18 01/18/2020   AST 14 01/18/2020   ALKPHOS 174 (H) 10/02/2019   BILITOT 0.3 01/18/2020   Lab Results  Component Value Date   HGBA1C 7.9 (H) 09/01/2017   HGBA1C 5.3 04/01/2017   HGBA1C 5.8 (H) 09/05/2015   HGBA1C 6.8 (H) 11/21/2014   HGBA1C 6.3 (H) 11/07/2014   No results found for: INSULIN Lab Results  Component Value Date   TSH 3.38 01/18/2020   Lab Results  Component Value Date   CHOL 70 01/18/2020   HDL  36 (L) 01/18/2020   LDLCALC 13 01/18/2020   TRIG 120 01/18/2020   CHOLHDL 1.9 01/18/2020   Lab Results  Component Value Date   WBC 3.6 (L) 01/18/2020   HGB 12.1 01/18/2020   HCT 37.8 01/18/2020   MCV 95.2 01/18/2020   PLT 241 01/18/2020   Lab Results  Component Value Date   IRON 7 (L) 08/31/2017   TIBC 164 (L) 08/31/2017   FERRITIN 27 11/29/2014   Attestation Statements:   Reviewed by clinician on day of visit: allergies,  medications, problem list, medical history, surgical history, family history, social history, and previous encounter notes.  Time spent on visit including pre-visit chart review and post-visit care and charting was 22 minutes.    I, Trixie Dredge, am acting as transcriptionist for Dennard Nip, MD.  I have reviewed the above documentation for accuracy and completeness, and I agree with the above. -  Dennard Nip, MD

## 2020-04-21 ENCOUNTER — Encounter: Payer: Self-pay | Admitting: Family Medicine

## 2020-04-21 ENCOUNTER — Telehealth: Payer: Self-pay | Admitting: Family Medicine

## 2020-04-21 ENCOUNTER — Other Ambulatory Visit: Payer: Self-pay | Admitting: Family Medicine

## 2020-04-21 DIAGNOSIS — G8929 Other chronic pain: Secondary | ICD-10-CM

## 2020-04-21 MED ORDER — PREGABALIN 50 MG PO CAPS: 50.0000 mg | ORAL_CAPSULE | Freq: Two times a day (BID) | ORAL | 1 refills | Status: DC

## 2020-04-21 NOTE — Telephone Encounter (Signed)
Error

## 2020-04-21 NOTE — Telephone Encounter (Signed)
Patient called to check status of Lyrica.

## 2020-04-21 NOTE — Telephone Encounter (Signed)
**  See Mychart message**  Requesting: Lyrica Contract:  UDS: 02/17/2018 Last OV: 01/18/2020 Next OV: 07/22/20 Last Refill: 10/819, #180--1 RF Database:   Please advise

## 2020-04-22 DIAGNOSIS — L89319 Pressure ulcer of right buttock, unspecified stage: Secondary | ICD-10-CM | POA: Diagnosis not present

## 2020-04-22 DIAGNOSIS — Z933 Colostomy status: Secondary | ICD-10-CM | POA: Diagnosis not present

## 2020-04-23 DIAGNOSIS — L89319 Pressure ulcer of right buttock, unspecified stage: Secondary | ICD-10-CM | POA: Diagnosis not present

## 2020-04-23 DIAGNOSIS — Z933 Colostomy status: Secondary | ICD-10-CM | POA: Diagnosis not present

## 2020-04-24 ENCOUNTER — Other Ambulatory Visit: Payer: Self-pay

## 2020-04-24 ENCOUNTER — Encounter: Payer: Self-pay | Admitting: Hematology and Oncology

## 2020-04-24 ENCOUNTER — Other Ambulatory Visit: Payer: Self-pay | Admitting: Family Medicine

## 2020-04-24 DIAGNOSIS — G8929 Other chronic pain: Secondary | ICD-10-CM

## 2020-04-24 MED ORDER — PREGABALIN 50 MG PO CAPS: 50.0000 mg | ORAL_CAPSULE | Freq: Two times a day (BID) | ORAL | 5 refills | Status: DC

## 2020-04-24 MED ORDER — ANASTROZOLE 1 MG PO TABS: 1.0000 mg | ORAL_TABLET | Freq: Every day | ORAL | 9 refills | Status: AC

## 2020-04-30 ENCOUNTER — Encounter (INDEPENDENT_AMBULATORY_CARE_PROVIDER_SITE_OTHER): Payer: Self-pay | Admitting: Family Medicine

## 2020-04-30 ENCOUNTER — Ambulatory Visit (INDEPENDENT_AMBULATORY_CARE_PROVIDER_SITE_OTHER): Payer: BC Managed Care – PPO | Admitting: Family Medicine

## 2020-04-30 ENCOUNTER — Other Ambulatory Visit (INDEPENDENT_AMBULATORY_CARE_PROVIDER_SITE_OTHER): Payer: Self-pay | Admitting: Family Medicine

## 2020-04-30 ENCOUNTER — Other Ambulatory Visit: Payer: Self-pay

## 2020-04-30 VITALS — BP 120/77 | HR 72 | Temp 97.9°F | Ht 62.0 in | Wt 204.0 lb

## 2020-04-30 DIAGNOSIS — E1169 Type 2 diabetes mellitus with other specified complication: Secondary | ICD-10-CM | POA: Diagnosis not present

## 2020-04-30 DIAGNOSIS — E66812 Obesity, class 2: Secondary | ICD-10-CM

## 2020-04-30 MED ORDER — RYBELSUS 7 MG PO TABS: 1.0000 | ORAL_TABLET | Freq: Every day | ORAL | 0 refills | Status: DC

## 2020-04-30 NOTE — Progress Notes (Signed)
Chief Complaint:   OBESITY Loribeth is here to discuss her progress with her obesity treatment plan along with follow-up of her obesity related diagnoses. Myrian is on keeping a food journal and adhering to recommended goals of 1100-1300 calories and 75+ grams of protein daily and states she is following her eating plan approximately 50% of the time. Kayleah states she is walking around the house and doing errands for 30 minutes 1-2 times per week.  Today's visit was #: 47 Starting weight: 208 lbs Starting date: 04/05/2018 Today's weight: 204 lbs Today's date: 04/30/2020 Total lbs lost to date: 4 Total lbs lost since last in-office visit: 0  Interim History: Kortnee has been dealing with increased family stress and has gotten off track with her eating plan. She is disappointed about her weight gain and is ready to get back on track.  Subjective:   1. Type 2 diabetes mellitus with other specified complication, without long-term current use of insulin ([place]) Radonna is on Rybelsus and appears to be tolerating it well. She has struggled more with her eating plan but is ready to get back on track.  Assessment/Plan:   1. Type 2 diabetes mellitus with other specified complication, without long-term current use of insulin (HCC) Good blood sugar control is important to decrease the likelihood of diabetic complications such as nephropathy, neuropathy, limb loss, blindness, coronary artery disease, and death. Intensive lifestyle modification including diet, exercise and weight loss are the first line of treatment for diabetes. Lakeishia will continue her medications and we will refill Rybelsus for 1 month.  2. Class 2 severe obesity with serious comorbidity and body mass index (BMI) of 37.0 to 37.9 in adult, unspecified obesity type (HCC) Audrionna is currently in the action stage of change. As such, her goal is to continue with weight loss efforts. She has agreed to the Category 2 Plan or keeping a food journal  and adhering to recommended goals of [phone] calories and 75+ grams of protein daily.   Exercise goals: As is.  Behavioral modification strategies: no skipping meals, meal planning and cooking strategies, emotional eating strategies and dealing with family or coworker sabotage.  Cecil has agreed to follow-up with our clinic in 2 to 3 weeks. She was informed of the importance of frequent follow-up visits to maximize her success with intensive lifestyle modifications for her multiple health conditions.   Objective:   Blood pressure 120/77, pulse 72, temperature 97.9 F (36.6 C), height 5\' 2"  (1.575 m), weight 204 lb (92.5 kg), SpO2 98 %. Body mass index is 37.31 kg/m.  General: Cooperative, alert, well developed, in no acute distress. HEENT: Conjunctivae and lids unremarkable. Cardiovascular: Regular rhythm.  Lungs: Normal work of breathing. Neurologic: No focal deficits.   Lab Results  Component Value Date   CREATININE 1.91 (H) 01/18/2020   BUN 33 (H) 01/18/2020   NA 139 01/18/2020   K 4.4 01/18/2020   CL 106 01/18/2020   CO2 23 01/18/2020   Lab Results  Component Value Date   ALT 18 01/18/2020   AST 14 01/18/2020   ALKPHOS 174 (H) 10/02/2019   BILITOT 0.3 01/18/2020   Lab Results  Component Value Date   HGBA1C 7.9 (H) 09/01/2017   HGBA1C 5.3 04/01/2017   HGBA1C 5.8 (H) 09/05/2015   HGBA1C 6.8 (H) 11/21/2014   HGBA1C 6.3 (H) 11/07/2014   No results found for: INSULIN Lab Results  Component Value Date   TSH 3.38 01/18/2020   Lab Results  Component  Value Date   CHOL 70 01/18/2020   HDL 36 (L) 01/18/2020   LDLCALC 13 01/18/2020   TRIG 120 01/18/2020   CHOLHDL 1.9 01/18/2020   Lab Results  Component Value Date   WBC 3.6 (L) 01/18/2020   HGB 12.1 01/18/2020   HCT 37.8 01/18/2020   MCV 95.2 01/18/2020   PLT 241 01/18/2020   Lab Results  Component Value Date   IRON 7 (L) 08/31/2017   TIBC 164 (L) 08/31/2017   FERRITIN 27 11/29/2014   Attestation  Statements:   Reviewed by clinician on day of visit: allergies, medications, problem list, medical history, surgical history, family history, social history, and previous encounter notes.  Time spent on visit including pre-visit chart review and post-visit care and charting was 30 minutes.    I, Trixie Dredge, am acting as transcriptionist for Dennard Nip, MD.  I have reviewed the above documentation for accuracy and completeness, and I agree with the above. -  Dennard Nip, MD

## 2020-04-30 NOTE — Telephone Encounter (Signed)
Yes, please.

## 2020-04-30 NOTE — Telephone Encounter (Signed)
Dr Leafy Ro, is this okay to send to [facility] for 90 day supply?   Please advise, thank you.

## 2020-05-01 ENCOUNTER — Telehealth (INDEPENDENT_AMBULATORY_CARE_PROVIDER_SITE_OTHER): Payer: Self-pay

## 2020-05-01 NOTE — Telephone Encounter (Signed)
Prior Auth for Rybelsus has been approved  This request has been approved using information available on the patient's profile. Case Id:  [id number]; Status:  Approved Review Type:  Prior Auth Coverage Start Date: 04/01/2020 Coverage End Date:  05/01/2023

## 2020-05-14 DIAGNOSIS — Z936 Other artificial openings of urinary tract status: Secondary | ICD-10-CM | POA: Diagnosis not present

## 2020-05-14 DIAGNOSIS — Z933 Colostomy status: Secondary | ICD-10-CM | POA: Diagnosis not present

## 2020-05-14 DIAGNOSIS — L89319 Pressure ulcer of right buttock, unspecified stage: Secondary | ICD-10-CM | POA: Diagnosis not present

## 2020-05-14 DIAGNOSIS — Z4801 Encounter for change or removal of surgical wound dressing: Secondary | ICD-10-CM | POA: Diagnosis not present

## 2020-05-15 DIAGNOSIS — Z936 Other artificial openings of urinary tract status: Secondary | ICD-10-CM | POA: Diagnosis not present

## 2020-05-15 DIAGNOSIS — L89319 Pressure ulcer of right buttock, unspecified stage: Secondary | ICD-10-CM | POA: Diagnosis not present

## 2020-05-15 DIAGNOSIS — Z933 Colostomy status: Secondary | ICD-10-CM | POA: Diagnosis not present

## 2020-05-15 DIAGNOSIS — Z4801 Encounter for change or removal of surgical wound dressing: Secondary | ICD-10-CM | POA: Diagnosis not present

## 2020-05-19 ENCOUNTER — Encounter (INDEPENDENT_AMBULATORY_CARE_PROVIDER_SITE_OTHER): Payer: Self-pay | Admitting: Family Medicine

## 2020-05-19 ENCOUNTER — Other Ambulatory Visit: Payer: Self-pay

## 2020-05-19 ENCOUNTER — Ambulatory Visit (INDEPENDENT_AMBULATORY_CARE_PROVIDER_SITE_OTHER): Payer: BC Managed Care – PPO | Admitting: Family Medicine

## 2020-05-19 VITALS — BP 105/66 | HR 58 | Temp 97.7°F | Ht 62.0 in | Wt 206.0 lb

## 2020-05-19 DIAGNOSIS — Z6837 Body mass index (BMI) 37.0-37.9, adult: Secondary | ICD-10-CM

## 2020-05-19 DIAGNOSIS — E7849 Other hyperlipidemia: Secondary | ICD-10-CM

## 2020-05-26 NOTE — Progress Notes (Signed)
Chief Complaint:   OBESITY [name] is here to discuss her progress with her obesity treatment plan along with follow-up of her obesity related diagnoses. [name] is on the Category 2 Plan or keeping a food journal and adhering to recommended goals of 1100-1200 calories and 75+ grams of protein daily and states she is following her eating plan approximately 80-90% of the time. [name] states she is doing 0 minutes 0 times per week.  Today's visit was #: 11 Starting weight: 208 lbs Starting date: 04/05/2018 Today's weight: 206 lbs Today's date: 05/19/2020 Total lbs lost to date: 2 Total lbs lost since last in-office visit: 0  Interim History: [name] has started back to journaling and she notes her calories have started to creep up. She has had a lot of stress and increased eating out. She likes to eat a larger breakfast but her husband likes a bigger dinner.  Subjective:   1. Other hyperlipidemia [name] is stable on Crestor and Lovaza, and she denies chest pain. She is working on diet and exercise. She is due for labs soon.  Assessment/Plan:   1. Other hyperlipidemia Cardiovascular risk and specific lipid/LDL goals reviewed. We discussed several lifestyle modifications today. [name] will continue to work on diet, exercise and weight loss efforts. We will plan to recheck labs in 1 month. Orders and follow up as documented in patient record.   2. Class 2 severe obesity with serious comorbidity and body mass index (BMI) of 37.0 to 37.9 in adult, unspecified obesity type (HCC) [name] is currently in the action stage of change. As such, her goal is to continue with weight loss efforts. She has agreed to keeping a food journal and adhering to recommended goals of 1200-1300 calories and 75+ grams of protein daily.   Lower calorie, high protein meal recipes were given to the patient today.  We will recheck fasting labs at her next visit.  Behavioral modification strategies: increasing lean protein  intake.  [name] has agreed to follow-up with our clinic in 3 to 4 weeks. She was informed of the importance of frequent follow-up visits to maximize her success with intensive lifestyle modifications for her multiple health conditions.   Objective:   Blood pressure 105/66, pulse (!) 58, temperature 97.7 F (36.5 C), height 5\' 2"  (1.575 m), weight 206 lb (93.4 kg), SpO2 100 %. Body mass index is 37.68 kg/m.  General: Cooperative, alert, well developed, in no acute distress. HEENT: Conjunctivae and lids unremarkable. Cardiovascular: Regular rhythm.  Lungs: Normal work of breathing. Neurologic: No focal deficits.   Lab Results  Component Value Date   CREATININE 1.91 (H) 01/18/2020   BUN 33 (H) 01/18/2020   NA 139 01/18/2020   K 4.4 01/18/2020   CL 106 01/18/2020   CO2 23 01/18/2020   Lab Results  Component Value Date   ALT 18 01/18/2020   AST 14 01/18/2020   ALKPHOS 174 (H) 10/02/2019   BILITOT 0.3 01/18/2020   Lab Results  Component Value Date   HGBA1C 7.9 (H) 09/01/2017   HGBA1C 5.3 04/01/2017   HGBA1C 5.8 (H) 09/05/2015   HGBA1C 6.8 (H) 11/21/2014   HGBA1C 6.3 (H) 11/07/2014   No results found for: INSULIN Lab Results  Component Value Date   TSH 3.38 01/18/2020   Lab Results  Component Value Date   CHOL 70 01/18/2020   HDL 36 (L) 01/18/2020   LDLCALC 13 01/18/2020   TRIG 120 01/18/2020   CHOLHDL 1.9 01/18/2020   Lab Results  Component Value Date   WBC 3.6 (L) 01/18/2020   HGB 12.1 01/18/2020   HCT 37.8 01/18/2020   MCV 95.2 01/18/2020   PLT 241 01/18/2020   Lab Results  Component Value Date   IRON 7 (L) 08/31/2017   TIBC 164 (L) 08/31/2017   FERRITIN 27 11/29/2014   Attestation Statements:   Reviewed by clinician on day of visit: allergies, medications, problem list, medical history, surgical history, family history, social history, and previous encounter notes.  Time spent on visit including pre-visit chart review and post-visit care and  charting was 20 minutes.    I, [name], am acting as transcriptionist for Dennard Nip, MD.  I have reviewed the above documentation for accuracy and completeness, and I agree with the above. -  Dennard Nip, MD

## 2020-06-04 ENCOUNTER — Telehealth: Payer: Self-pay | Admitting: *Deleted

## 2020-06-04 NOTE — Telephone Encounter (Signed)
Patient called and stated "my discharge and spotting has changed. It was dark red blood. But for over a month the bleeding has changed to bright red blood. I am using two pads a day. I am having some cramping abdomen and pelvic pain." Explained that the message will be giving to the desk RN and someone will call her back.

## 2020-06-05 NOTE — Telephone Encounter (Signed)
Told [name] that Dr. Denman [name] stated that she will have intermittent bleeding.  Reviewed using the tampon in the vagina to absorb the drainage.  Pt has done that in the past but it has not been very comfortable. Told her that Dr. Denman [name] states that she is not worried about a recurrence.  [name] is concerned that she may have a UTI.  Urine is cloudy and foul smelling.  It is difficult to discern as she has an ileal conduit and the fistula. Suggested that she call her PCP Dr. Carollee Herter to bring a urine specimen to checked.  Pt stated that she will call  Her PCP.

## 2020-06-16 ENCOUNTER — Ambulatory Visit (INDEPENDENT_AMBULATORY_CARE_PROVIDER_SITE_OTHER): Payer: BC Managed Care – PPO | Admitting: Family Medicine

## 2020-06-16 ENCOUNTER — Other Ambulatory Visit: Payer: Self-pay

## 2020-06-16 ENCOUNTER — Encounter (INDEPENDENT_AMBULATORY_CARE_PROVIDER_SITE_OTHER): Payer: Self-pay | Admitting: Family Medicine

## 2020-06-16 VITALS — BP 109/70 | HR 78 | Temp 97.5°F | Ht 62.0 in | Wt 203.0 lb

## 2020-06-16 DIAGNOSIS — Z6837 Body mass index (BMI) 37.0-37.9, adult: Secondary | ICD-10-CM

## 2020-06-16 DIAGNOSIS — L89319 Pressure ulcer of right buttock, unspecified stage: Secondary | ICD-10-CM | POA: Diagnosis not present

## 2020-06-16 DIAGNOSIS — E538 Deficiency of other specified B group vitamins: Secondary | ICD-10-CM | POA: Diagnosis not present

## 2020-06-16 DIAGNOSIS — Z936 Other artificial openings of urinary tract status: Secondary | ICD-10-CM | POA: Diagnosis not present

## 2020-06-16 DIAGNOSIS — E7849 Other hyperlipidemia: Secondary | ICD-10-CM

## 2020-06-16 DIAGNOSIS — E1165 Type 2 diabetes mellitus with hyperglycemia: Secondary | ICD-10-CM

## 2020-06-16 DIAGNOSIS — E66812 Obesity, class 2: Secondary | ICD-10-CM

## 2020-06-16 DIAGNOSIS — Z9189 Other specified personal risk factors, not elsewhere classified: Secondary | ICD-10-CM | POA: Diagnosis not present

## 2020-06-16 DIAGNOSIS — E559 Vitamin D deficiency, unspecified: Secondary | ICD-10-CM

## 2020-06-16 DIAGNOSIS — Z4801 Encounter for change or removal of surgical wound dressing: Secondary | ICD-10-CM | POA: Diagnosis not present

## 2020-06-16 DIAGNOSIS — Z933 Colostomy status: Secondary | ICD-10-CM | POA: Diagnosis not present

## 2020-06-17 LAB — COMPREHENSIVE METABOLIC PANEL
ALT: 33 IU/L — ABNORMAL HIGH (ref 0–32)
AST: 25 IU/L (ref 0–40)
Albumin/Globulin Ratio: 1.3 (ref 1.2–2.2)
Albumin: 4.4 g/dL (ref 3.8–4.8)
Alkaline Phosphatase: 175 IU/L — ABNORMAL HIGH (ref 44–121)
BUN/Creatinine Ratio: 20 (ref 12–28)
BUN: 32 mg/dL — ABNORMAL HIGH (ref 8–27)
Bilirubin Total: 0.3 mg/dL (ref 0.0–1.2)
CO2: 22 mmol/L (ref 20–29)
Calcium: 9.4 mg/dL (ref 8.7–10.3)
Chloride: 104 mmol/L (ref 96–106)
Creatinine, Ser: 1.64 mg/dL — ABNORMAL HIGH (ref 0.57–1.00)
GFR calc Af Amer: 38 mL/min/{1.73_m2} — ABNORMAL LOW (ref >59)
GFR calc non Af Amer: 33 mL/min/{1.73_m2} — ABNORMAL LOW (ref >59)
Globulin, Total: 3.4 g/dL (ref 1.5–4.5)
Glucose: 92 mg/dL (ref 65–99)
Potassium: 4 mmol/L (ref 3.5–5.2)
Sodium: 144 mmol/L (ref 134–144)
Total Protein: 7.8 g/dL (ref 6.0–8.5)

## 2020-06-17 LAB — INSULIN, RANDOM: INSULIN: 14.1 u[IU]/mL (ref 2.6–24.9)

## 2020-06-17 LAB — LIPID PANEL WITH LDL/HDL RATIO
Cholesterol, Total: 92 mg/dL — ABNORMAL LOW (ref 100–199)
HDL: 49 mg/dL (ref >39)
LDL Chol Calc (NIH): 21 mg/dL (ref 0–99)
LDL/HDL Ratio: 0.4 ratio (ref 0.0–3.2)
Triglycerides: 128 mg/dL (ref 0–149)
VLDL Cholesterol Cal: 22 mg/dL (ref 5–40)

## 2020-06-17 LAB — VITAMIN B12: Vitamin B-12: 1198 pg/mL (ref 232–1245)

## 2020-06-17 LAB — HEMOGLOBIN A1C
Est. average glucose Bld gHb Est-mCnc: 134 mg/dL
Hgb A1c MFr Bld: 6.3 % — ABNORMAL HIGH (ref 4.8–5.6)

## 2020-06-17 LAB — VITAMIN D 25 HYDROXY (VIT D DEFICIENCY, FRACTURES): Vit D, 25-Hydroxy: 25.9 ng/mL — ABNORMAL LOW (ref 30.0–100.0)

## 2020-06-17 LAB — PREALBUMIN: PREALBUMIN: 32 mg/dL (ref 10–36)

## 2020-06-17 NOTE — Progress Notes (Signed)
Chief Complaint:   OBESITY [name] is here to discuss her progress with her obesity treatment plan along with follow-up of her obesity related diagnoses. [name] is on keeping a food journal and adhering to recommended goals of 1200-1300 calories and 75+ grams of protein daily and states she is following her eating plan approximately 95% of the time. [name] states she is doing ADL's around the house.  Today's visit was #: 80 Starting weight: 208 lbs Starting date: 04/05/2018 Today's weight: 203 lbs Today's date: 06/16/2020 Total lbs lost to date: 5 Total lbs lost since last in-office visit: 3  Interim History: [name] continues to do well with weight loss. She is getting ready to go on vacation to [place] over Thanksgiving. She is open to discussing holiday and travel eating, and activity strategies.   Subjective:   1. Other hyperlipidemia [name] doing well with diet and weight loss. She is due for labs.  2. Uncontrolled type 2 diabetes mellitus with hyperglycemia ([place]) [name] is due for her A1c. She is trying to eat healthier and has gotten back on track with her weight loss efforts.  3. Vitamin D deficiency [name] hasn't had her Vit D level checked for a while, and she is due for labs. She notes fatigue.  4. B12 deficiency [name] is at high risk of B12 deficiency, and she hasn't had her level checked in a while. She notes fatigue.  5. At risk for dehydration [name] is at risk for dehydrations due to inadequate water intake.  Assessment/Plan:   1. Other hyperlipidemia Cardiovascular risk and specific lipid/LDL goals reviewed. We discussed several lifestyle modifications today. [place] will continue to work on diet, exercise and weight loss efforts. We will check labs today. Orders and follow up as documented in patient record.   - Lipid Panel With LDL/HDL Ratio  2. Uncontrolled type 2 diabetes mellitus with hyperglycemia (HCC) Good blood sugar control is important to decrease  the likelihood of diabetic complications such as nephropathy, neuropathy, limb loss, blindness, coronary artery disease, and death. Intensive lifestyle modification including diet, exercise and weight loss are the first line of treatment for diabetes. We will check labs today.  - Comprehensive metabolic panel - Hemoglobin A1c - Insulin, random  3. Vitamin D deficiency Low Vitamin D level contributes to fatigue and are associated with obesity, breast, and colon cancer. We will check labs today. [name] will follow-up for routine testing of Vitamin D, at least 2-3 times per year to avoid over-replacement.  - VITAMIN D 25 Hydroxy (Vit-D Deficiency, Fractures)  4. B12 deficiency The diagnosis was reviewed with the patient. We will check labs today and will continue to monitor. Orders and follow up as documented in patient record.  - Vitamin B12 - Prealbumin  5. At risk for dehydration [name] was given approximately 15 minutes dehydration prevention counseling today. [name] is at risk for dehydration due to weight loss and current medication(s). She was encouraged to hydrate and monitor fluid status to avoid dehydration as well as weight loss plateaus.   6. Class 2 severe obesity with serious comorbidity and body mass index (BMI) of 37.0 to 37.9 in adult, unspecified obesity type (HCC) [name] is currently in the action stage of change. As such, her goal is to continue with weight loss efforts. She has agreed to keeping a food journal and adhering to recommended goals of 1200-1300 calories and 75+ grams of protein daily.   Exercise goals: As is.  Behavioral modification strategies: increasing water  intake, meal planning and cooking strategies, travel eating strategies and holiday eating strategies .  [name] has agreed to follow-up with our clinic in 3 to 4 weeks. She was informed of the importance of frequent follow-up visits to maximize her success with intensive lifestyle modifications for her  multiple health conditions.   [name] was informed we would discuss her lab results at her next visit unless there is a critical issue that needs to be addressed sooner. [name] agreed to keep her next visit at the agreed upon time to discuss these results.  Objective:   Blood pressure 109/70, pulse 78, temperature (!) 97.5 F (36.4 C), height 5\' 2"  (1.575 m), weight 203 lb (92.1 kg), SpO2 98 %. Body mass index is 37.13 kg/m.  General: Cooperative, alert, well developed, in no acute distress. HEENT: Conjunctivae and lids unremarkable. Cardiovascular: Regular rhythm.  Lungs: Normal work of breathing. Neurologic: No focal deficits.   Lab Results  Component Value Date   CREATININE 1.64 (H) 06/16/2020   BUN 32 (H) 06/16/2020   NA 144 06/16/2020   K 4.0 06/16/2020   CL 104 06/16/2020   CO2 22 06/16/2020   Lab Results  Component Value Date   ALT 33 (H) 06/16/2020   AST 25 06/16/2020   ALKPHOS 175 (H) 06/16/2020   BILITOT 0.3 06/16/2020   Lab Results  Component Value Date   HGBA1C 6.3 (H) 06/16/2020   HGBA1C 7.9 (H) 09/01/2017   HGBA1C 5.3 04/01/2017   HGBA1C 5.8 (H) 09/05/2015   HGBA1C 6.8 (H) 11/21/2014   Lab Results  Component Value Date   INSULIN 14.1 06/16/2020   Lab Results  Component Value Date   TSH 3.38 01/18/2020   Lab Results  Component Value Date   CHOL 92 (L) 06/16/2020   HDL 49 06/16/2020   LDLCALC 21 06/16/2020   TRIG 128 06/16/2020   CHOLHDL 1.9 01/18/2020   Lab Results  Component Value Date   WBC 3.6 (L) 01/18/2020   HGB 12.1 01/18/2020   HCT 37.8 01/18/2020   MCV 95.2 01/18/2020   PLT 241 01/18/2020   Lab Results  Component Value Date   IRON 7 (L) 08/31/2017   TIBC 164 (L) 08/31/2017   FERRITIN 27 11/29/2014   Attestation Statements:   Reviewed by clinician on day of visit: allergies, medications, problem list, medical history, surgical history, family history, social history, and previous encounter notes.   I, Trixie Dredge, am  acting as transcriptionist for Dennard Nip, MD.  I have reviewed the above documentation for accuracy and completeness, and I agree with the above. -  Dennard Nip, MD

## 2020-06-18 DIAGNOSIS — N1832 Chronic kidney disease, stage 3b: Secondary | ICD-10-CM | POA: Diagnosis not present

## 2020-06-18 DIAGNOSIS — E872 Acidosis: Secondary | ICD-10-CM | POA: Diagnosis not present

## 2020-06-18 DIAGNOSIS — E119 Type 2 diabetes mellitus without complications: Secondary | ICD-10-CM | POA: Diagnosis not present

## 2020-06-18 DIAGNOSIS — I10 Essential (primary) hypertension: Secondary | ICD-10-CM | POA: Diagnosis not present

## 2020-07-01 DIAGNOSIS — E119 Type 2 diabetes mellitus without complications: Secondary | ICD-10-CM | POA: Diagnosis not present

## 2020-07-01 DIAGNOSIS — E039 Hypothyroidism, unspecified: Secondary | ICD-10-CM | POA: Diagnosis not present

## 2020-07-01 DIAGNOSIS — N184 Chronic kidney disease, stage 4 (severe): Secondary | ICD-10-CM | POA: Diagnosis not present

## 2020-07-01 DIAGNOSIS — E782 Mixed hyperlipidemia: Secondary | ICD-10-CM | POA: Diagnosis not present

## 2020-07-01 DIAGNOSIS — E559 Vitamin D deficiency, unspecified: Secondary | ICD-10-CM | POA: Diagnosis not present

## 2020-07-01 DIAGNOSIS — I10 Essential (primary) hypertension: Secondary | ICD-10-CM | POA: Diagnosis not present

## 2020-07-03 DIAGNOSIS — I129 Hypertensive chronic kidney disease with stage 1 through stage 4 chronic kidney disease, or unspecified chronic kidney disease: Secondary | ICD-10-CM | POA: Diagnosis not present

## 2020-07-03 DIAGNOSIS — Z79899 Other long term (current) drug therapy: Secondary | ICD-10-CM | POA: Diagnosis not present

## 2020-07-03 DIAGNOSIS — N184 Chronic kidney disease, stage 4 (severe): Secondary | ICD-10-CM | POA: Diagnosis not present

## 2020-07-03 DIAGNOSIS — E1122 Type 2 diabetes mellitus with diabetic chronic kidney disease: Secondary | ICD-10-CM | POA: Diagnosis not present

## 2020-07-15 ENCOUNTER — Other Ambulatory Visit: Payer: Self-pay

## 2020-07-15 ENCOUNTER — Ambulatory Visit (INDEPENDENT_AMBULATORY_CARE_PROVIDER_SITE_OTHER): Payer: BC Managed Care – PPO | Admitting: Family Medicine

## 2020-07-15 ENCOUNTER — Encounter (INDEPENDENT_AMBULATORY_CARE_PROVIDER_SITE_OTHER): Payer: Self-pay | Admitting: Family Medicine

## 2020-07-15 VITALS — BP 127/80 | HR 73 | Temp 97.9°F | Ht 62.0 in | Wt 208.0 lb

## 2020-07-15 DIAGNOSIS — Z6838 Body mass index (BMI) 38.0-38.9, adult: Secondary | ICD-10-CM

## 2020-07-15 DIAGNOSIS — E1169 Type 2 diabetes mellitus with other specified complication: Secondary | ICD-10-CM | POA: Diagnosis not present

## 2020-07-15 NOTE — Progress Notes (Signed)
Chief Complaint:   OBESITY [name] is here to discuss her progress with her obesity treatment plan along with follow-up of her obesity related diagnoses. [name] is on keeping a food journal and adhering to recommended goals of 1200-1300 calories and 75+ grams of protein daily and states she is following her eating plan approximately 50% of the time. [name] states she was walking at [employer].  Today's visit was #: 59 Starting weight: 208 lbs Starting date: 04/05/2018 Today's weight: 208 lbs Today's date: 07/15/2020 Total lbs lost to date: 0 Total lbs lost since last in-office visit: 0  Interim History: [name] did well minimizing her weight gain even on vacation, traveling and Thanksgiving. She is working on increased protein but she struggles to meet this at times.  Subjective:   1. Type 2 diabetes mellitus with other specified complication, without long-term current use of insulin (HCC) [name]'s A1c is very well controlled at 6.3. She is doing well with diet overall. She denies signs of hypoglycemia.  Assessment/Plan:   1. Type 2 diabetes mellitus with other specified complication, without long-term current use of insulin (HCC) Good blood sugar control is important to decrease the likelihood of diabetic complications such as nephropathy, neuropathy, limb loss, blindness, coronary artery disease, and death. Intensive lifestyle modification including diet, exercise and weight loss are the first line of treatment for diabetes. [name] will continue Rybelsus and make sure to eat approximately 30 minutes after the dose.  2. Class 2 severe obesity with serious comorbidity and body mass index (BMI) of 38.0 to 38.9 in adult, unspecified obesity type (HCC) [place] is currently in the action stage of change. As such, her goal is to continue with weight loss efforts. She has agreed to keeping a food journal and adhering to recommended goals of 1200-1300 calories and 75+ grams of protein daily.   Exercise  goals: As is.  Behavioral modification strategies: increasing lean protein intake, meal planning and cooking strategies and holiday eating strategies .  [name] has agreed to follow-up with our clinic in 4 weeks. She was informed of the importance of frequent follow-up visits to maximize her success with intensive lifestyle modifications for her multiple health conditions.   Objective:   Blood pressure 127/80, pulse 73, temperature 97.9 F (36.6 C), height 5\' 2"  (1.575 m), weight 208 lb (94.3 kg), SpO2 100 %. Body mass index is 38.04 kg/m.  General: Cooperative, alert, well developed, in no acute distress. HEENT: Conjunctivae and lids unremarkable. Cardiovascular: Regular rhythm.  Lungs: Normal work of breathing. Neurologic: No focal deficits.   Lab Results  Component Value Date   CREATININE 1.64 (H) 06/16/2020   BUN 32 (H) 06/16/2020   NA 144 06/16/2020   K 4.0 06/16/2020   CL 104 06/16/2020   CO2 22 06/16/2020   Lab Results  Component Value Date   ALT 33 (H) 06/16/2020   AST 25 06/16/2020   ALKPHOS 175 (H) 06/16/2020   BILITOT 0.3 06/16/2020   Lab Results  Component Value Date   HGBA1C 6.3 (H) 06/16/2020   HGBA1C 7.9 (H) 09/01/2017   HGBA1C 5.3 04/01/2017   HGBA1C 5.8 (H) 09/05/2015   HGBA1C 6.8 (H) 11/21/2014   Lab Results  Component Value Date   INSULIN 14.1 06/16/2020   Lab Results  Component Value Date   TSH 3.38 01/18/2020   Lab Results  Component Value Date   CHOL 92 (L) 06/16/2020   HDL 49 06/16/2020   LDLCALC 21 06/16/2020   TRIG 128 06/16/2020  CHOLHDL 1.9 01/18/2020   Lab Results  Component Value Date   WBC 3.6 (L) 01/18/2020   HGB 12.1 01/18/2020   HCT 37.8 01/18/2020   MCV 95.2 01/18/2020   PLT 241 01/18/2020   Lab Results  Component Value Date   IRON 7 (L) 08/31/2017   TIBC 164 (L) 08/31/2017   FERRITIN 27 11/29/2014   Attestation Statements:   Reviewed by clinician on day of visit: allergies, medications, problem list, medical  history, surgical history, family history, social history, and previous encounter notes.  Time spent on visit including pre-visit chart review and post-visit care and charting was 30 minutes.    I, Trixie Dredge, am acting as transcriptionist for Dennard Nip, MD.  I have reviewed the above documentation for accuracy and completeness, and I agree with the above. -  Dennard Nip, MD

## 2020-07-22 ENCOUNTER — Ambulatory Visit (INDEPENDENT_AMBULATORY_CARE_PROVIDER_SITE_OTHER): Payer: BC Managed Care – PPO | Admitting: Family Medicine

## 2020-07-22 ENCOUNTER — Encounter: Payer: Self-pay | Admitting: Family Medicine

## 2020-07-22 ENCOUNTER — Other Ambulatory Visit: Payer: Self-pay

## 2020-07-22 DIAGNOSIS — E785 Hyperlipidemia, unspecified: Secondary | ICD-10-CM | POA: Diagnosis not present

## 2020-07-22 DIAGNOSIS — E1122 Type 2 diabetes mellitus with diabetic chronic kidney disease: Secondary | ICD-10-CM | POA: Diagnosis not present

## 2020-07-22 DIAGNOSIS — G8929 Other chronic pain: Secondary | ICD-10-CM

## 2020-07-22 DIAGNOSIS — N184 Chronic kidney disease, stage 4 (severe): Secondary | ICD-10-CM

## 2020-07-22 DIAGNOSIS — I10 Essential (primary) hypertension: Secondary | ICD-10-CM

## 2020-07-22 MED ORDER — PREGABALIN 50 MG PO CAPS: 50.0000 mg | ORAL_CAPSULE | Freq: Two times a day (BID) | ORAL | 5 refills | Status: DC

## 2020-07-22 NOTE — Assessment & Plan Note (Signed)
Well controlled, no changes to meds. Encouraged heart healthy diet such as the DASH diet and exercise as tolerated.  °

## 2020-07-22 NOTE — Assessment & Plan Note (Signed)
Encouraged heart healthy diet, increase exercise, avoid trans fats, consider a krill oil cap daily Lab Results  Component Value Date   CHOL 92 (L) 06/16/2020   HDL 49 06/16/2020   LDLCALC 21 06/16/2020   TRIG 128 06/16/2020   CHOLHDL 1.9 01/18/2020

## 2020-07-22 NOTE — Assessment & Plan Note (Signed)
Chemistry      Component Value Date/Time   NA 144 06/16/2020 0834   NA 141 01/24/2017 1228   K 4.0 06/16/2020 0834   K 4.6 01/24/2017 1228   CL 104 06/16/2020 0834   CO2 22 06/16/2020 0834   CO2 29 01/24/2017 1228   BUN 32 (H) 06/16/2020 0834   BUN 22.2 01/24/2017 1228   CREATININE 1.64 (H) 06/16/2020 0834   CREATININE 1.91 (H) 01/18/2020 1440   CREATININE 0.8 01/24/2017 1228   GLU 131 11/29/2018 0000      Component Value Date/Time   CALCIUM 9.4 06/16/2020 0834   CALCIUM 10.2 01/24/2017 1228   ALKPHOS 175 (H) 06/16/2020 0834   ALKPHOS 90 01/24/2017 1228   AST 25 06/16/2020 0834   AST 14 (L) 03/27/2018 1324   AST 16 01/24/2017 1228   ALT 33 (H) 06/16/2020 0834   ALT 18 03/27/2018 1324   ALT 14 01/24/2017 1228   BILITOT 0.3 06/16/2020 0834   BILITOT 0.2 (L) 03/27/2018 1324   BILITOT 0.34 01/24/2017 1228     Per nephrology

## 2020-07-22 NOTE — Assessment & Plan Note (Signed)
hgba1c acceptable, minimize simple carbs. Increase exercise as tolerated. Continue current meds 

## 2020-07-22 NOTE — Progress Notes (Signed)
Patient ID: [name], female    DOB: [birth year]  Age: 64 y.o. MRN: [id number]    Subjective:  Subjective  HPI [name] presents for f/u bp , chol and dm.   Labs done last month No new complaints  Review of Systems  Constitutional: Negative for appetite change, diaphoresis, fatigue and unexpected weight change.  Eyes: Negative for pain, redness and visual disturbance.  Respiratory: Negative for cough, chest tightness, shortness of breath and wheezing.   Cardiovascular: Negative for chest pain, palpitations and leg swelling.  Endocrine: Negative for cold intolerance, heat intolerance, polydipsia, polyphagia and polyuria.  Genitourinary: Negative for difficulty urinating, dysuria and frequency.  Neurological: Negative for dizziness, light-headedness, numbness and headaches.    History Past Medical History:  Diagnosis Date  . Adrenal adenoma, left 02/08/2016   CT: stable benign  . Anemia in neoplastic disease   . Back pain   . Benign essential HTN   . 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 Cancer of corpus uteri, except isthmus [facility])  oncologist-- dr Denman [name] and dr Alvy Bimler    10-15-2004  dx endometroid endometrial and ovarian cancer s/p  chemotheapy and surgery(TAH w/ BSO) :  recurrent 11-19-2014 post pelvic surgery and radiation 01-29-2015 to 03-10-2015  . Chronic idiopathic neutropenia (HCC)    presumed related to chemotherapy March 2006--- followed by dr Alvy Bimler (treatment w/ G-CSF injections  . Chronic nausea   . Chronic pain    perineal/ anal  area from bladder pad irritates skin , right flank pain  . CKD stage G2/A3, GFR 60-89 and albumin creatinine ratio >300 mg/g    nephrologist-  dr Madelon Lips  . Colovesical fistula   . Diabetic retinopathy, background ([place])   .  Difficult intravenous access    small veins--- hx PICC lines  . DM type 2 (diabetes mellitus, type 2) ([place])    monitored by dr Legrand Como altheimer  . Dysuria   . Environmental and seasonal allergies   . Fatty liver 02/08/2016   CT  . Generalized muscle weakness   . GERD (gastroesophageal reflux disease)   . Hiatal hernia   . History of abdominal abscess 04/16/2017   post surgery 04-01-2017  --- resolved 10/ 2018  . History of gastric polyp    2014  duodenum  . History of ileus 04/16/2017   resolved w/ no surgical intervention  . History of radiation therapy    01-29-2015 to 03-10-2015  pelvis 50.4Gy  . Hypothyroidism    monitored by dr Legrand Como altheimer  . IBS (irritable bowel syndrome)   . Ileostomy in place [facility]) 04/01/2017   created at same time colostomy takedown.  . Joint pain   . Leg edema   . Lower urinary tract symptoms (LUTS)    urge urinary  incontinence  . Mixed dyslipidemia   . Multiple thyroid nodules    Managed by Dr. Harlow Asa  . Nephrostomy status ([place])   . Palpitations   . Pelvic abscess in female 04/16/2017  . PONV (postoperative nausea and vomiting)    "scopolamine patch works for me"  . Radiation-induced dermatitis    contact dermatitis , radiation completed, rash only on ankles now.  . SBO (small bowel obstruction) ([place]) 01/2019  . Seasonal allergies   . Ureteral stricture, right UROLOGIT-  DR [facility]  CHRONIC--  TREATMENT URETERAL STENT  . 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 Vitamin D deficiency   . Wears glasses     She has a past surgical history that includes Appendectomy; Tonsillectomy; Colonoscopy with propofol (N/A, 08/21/2013); EUS (N/A, 10/02/2014); Robotic assisted lap vaginal hysterectomy (N/A, 11/19/2014); Ostomy (N/A, 11/19/2014); Colostomy takedown (N/A, 12/04/2014); Cystoscopy with retrograde pyelogram, ureteroscopy and stent placement (Right, 03/20/2015); EXCISION SOFT TISSUE MASS RIGHT FOREMAN (12-08-2006); Cystoscopy with retrograde  pyelogram, ureteroscopy and stent placement (Right, 05/02/2015); Cystoscopy with retrograde pyelogram, ureteroscopy and stent placement (Right, 09/05/2015); Cystoscopy w/ retrogrades (Right, 11/21/2015); Cystoscopy w/ ureteral stent placement (Right, 11/21/2015); Mastectomy w/ sentinel node biopsy (Bilateral, 12/11/2015); Breast reconstruction with placement of tissue expander and flex hd (acellular hydrated dermis) (Bilateral, 12/11/2015); Incision and drainage of wound (Bilateral, 12/26/2015); Tissue expander filling (Bilateral, 12/26/2015); Laparoscopic cholecystectomy (1990); Eye surgery (as child); Cystoscopy w/ ureteral stent placement (Right, 03/10/2016); Removal of bilateral tissue expanders with placement of bilateral breast implants (Bilateral, 04/16/2016); Liposuction with lipofilling (Bilateral, 04/16/2016); Cystoscopy w/ ureteral stent placement (Right, 06/30/2016); Cystoscopy with stent placement (Right, 10/27/2016); XI robotic assisted lower anterior resection (N/A, 04/01/2017); Proctoscopy (N/A, 04/01/2017); Cystoscopy with retrograde pyelogram, ureteroscopy and stent placement (Right, 04/01/2017); IR US Guide Vasc Access Right (04/06/2017); IR Fluoro Guide CV Line Right (04/06/2017); IR US Guide Vasc Access Right (05/30/2017); IR Fluoro Guide CV Midline PICC Right (05/30/2017); IR Fluoro Guide CV Line Left (05/31/2017); IR US Guide Vasc Access Left (05/31/2017); IR CV Line Injection (05/31/2017); IR Radiologist Eval & Mgmt (05/03/2017); Cystoscopy w/ ureteral stent placement (N/A, 06/01/2017); Cystogram (N/A, 06/01/2017); Total abdominal hysterectomy (March 2006   [facility]); Cystoscopy w/ ureteral stent placement (Right, 08/17/2017); IR NEPHROSTOGRAM RIGHT INITIAL PLACEMENT (09/02/2017); IR NEPHROSTOGRAM LEFT INITIAL PLACEMENT (09/02/2017); IR NEPHROSTOGRAM RIGHT THRU EXISTING ACCESS (09/13/2017); IR NEPHROSTOMY PLACEMENT LEFT (10/02/2017); IR NEPHROSTOMY EXCHANGE RIGHT (10/02/2017); IR NEPHROSTOMY EXCHANGE RIGHT (11/28/2017); IR  NEPHROSTOMY EXCHANGE LEFT (11/28/2017); IR NEPHROSTOGRAM LEFT THRU EXISTING ACCESS (11/29/2017); IR NEPHROSTOGRAM RIGHT THRU EXISTING ACCESS (11/29/2017); IR NEPHROSTOMY EXCHANGE LEFT (01/05/2018); IR NEPHROSTOMY EXCHANGE RIGHT (01/05/2018); IR NEPHROSTOMY EXCHANGE RIGHT (02/16/2018); IR NEPHROSTOMY EXCHANGE LEFT (02/16/2018); IR NEPHROSTOMY EXCHANGE RIGHT (03/30/2018); IR NEPHROSTOMY EXCHANGE LEFT (03/30/2018); biopsy thyroid nodules; IR NEPHROSTOMY EXCHANGE RIGHT (05/12/2018); IR NEPHROSTOMY EXCHANGE LEFT (05/12/2018); IR NEPHROSTOMY EXCHANGE RIGHT (06/21/2018); IR NEPHROSTOMY EXCHANGE LEFT (06/21/2018); IR NEPHROSTOMY EXCHANGE LEFT (08/04/2018); IR NEPHROSTOMY EXCHANGE RIGHT (08/04/2018); IR NEPHROSTOMY EXCHANGE RIGHT (09/18/2018); IR NEPHROSTOMY EXCHANGE LEFT (09/18/2018); IR NEPHROSTOMY EXCHANGE LEFT (10/09/2018); IR NEPHROSTOMY EXCHANGE RIGHT (10/27/2018); IR NEPHROSTOMY EXCHANGE LEFT (10/27/2018); IR NEPHROSTOMY EXCHANGE RIGHT (11/21/2018); IR NEPHROSTOMY EXCHANGE LEFT (11/21/2018); IR NEPHROSTOMY EXCHANGE LEFT (01/05/2019); IR NEPHROSTOMY EXCHANGE RIGHT (01/05/2019); IR NEPHROSTOMY EXCHANGE LEFT (02/15/2019); IR NEPHROSTOMY EXCHANGE RIGHT (02/15/2019); IR NEPHROSTOMY EXCHANGE RIGHT (03/29/2019); and IR NEPHROSTOMY EXCHANGE LEFT (03/29/2019).   Her family history includes Brain cancer in her paternal grandfather; Breast cancer in her maternal aunt; Cancer (age of onset: 48) in her mother; Cancer (age of onset: 60) in her father; Diabetes in her brother, father, and sister; Heart disease in her brother and father; Hyperlipidemia in her father; Hypertension in her father and mother; Hypertension (age of onset: y-10) in her brother; Lymphoma in her paternal aunt; Obesity in her father; Ovarian cancer in an other family member.She reports that she has never smoked. She has never used smokeless tobacco. She reports previous alcohol use. She reports that she does not use drugs.  Current Outpatient Medications on File Prior to Visit   Medication Sig Dispense Refill  . acetaminophen (TYLENOL) 325 MG tablet Take  2 tablets (650 mg total) by mouth every 6 (six) hours as needed. 30 tablet 1  . albuterol (PROVENTIL HFA) 108 (90 Base) MCG/ACT inhaler Inhale 1 puff into the lungs every 6 (six) hours as needed for wheezing or shortness of breath. 18 g 2  . aspirin EC 81 MG tablet Take 1 tablet (81 mg total) by mouth every Monday, Wednesday, and Friday. Swallow whole. 90 tablet 3  . Biotin 5 MG TABS Take 5 mg by mouth daily.     . calcium citrate-vitamin D (CITRACAL+D) 315-200 MG-UNIT tablet Take 1 tablet by mouth daily.    . Cholecalciferol (VITAMIN D3) 10000 UNITS capsule Take 10,000 Units by mouth See admin instructions. Take 1 tablet by mouth 4 times per week.    . clobetasol (OLUX) 0.05 % topical foam Apply topically 2 (two) times daily.    . diphenhydrAMINE (BENADRYL) 25 mg capsule Take 1 capsule (25 mg total) by mouth every 8 (eight) hours as needed for itching, allergies or sleep. 30 capsule 0  . ferrous sulfate 325 (65 FE) MG tablet Take 1 tablet (325 mg total) by mouth at bedtime. 30 tablet 3  . filgrastim-aafi (NIVESTYM) 480 MCG/0.8ML SOSY injection 480 mcg subcutaneous injection every 6 days, for life 11.2 mL 11  . levothyroxine (SYNTHROID, LEVOTHROID) 150 MCG tablet Take 1 tablet (150 mcg total) by mouth daily before breakfast. 30 tablet 1  . loratadine (CLARITIN) 10 MG tablet Take 10 mg by mouth daily.     . Melatonin 10 MG TABS Take 1 tablet by mouth at bedtime as needed.    . mupirocin ointment (BACTROBAN) 2 % Place 1 application into the nose 2 (two) times daily. 22 g 0  . omega-3 acid ethyl esters (LOVAZA) 1 G capsule Take 1 g by mouth 2 (two) times daily.     Vladimir Faster Glycol-Propyl Glycol (SYSTANE OP) Place 1 drop into both eyes daily as needed (dry eyes).     . Prenatal Vit-Fe Fumarate-FA (PRENATAL VITAMIN PO) Take 1 capsule by mouth daily. Takes prenatal because there are no dyes in it    . Probiotic Product  (ALIGN PO) Take by mouth 2 (two) times daily.    . rifaximin (XIFAXAN) 200 MG tablet Take 200 mg by mouth 3 (three) times daily.    . rosuvastatin (CRESTOR) 10 MG tablet Take 10 mg by mouth every evening.     . Saccharomyces boulardii (FLORASTOR PO) Take 1 capsule by mouth daily.    . Semaglutide (RYBELSUS) 7 MG TABS Take 1 tablet by mouth daily. 90 tablet 0  . Tbo-Filgrastim (GRANIX) 480 MCG/0.8ML SOSY injection 480 mcg subcutaneous injection every 6 days life long 11.2 mL 11   No current facility-administered medications on file prior to visit.     Objective:  Objective  Physical Exam Vitals and nursing note reviewed.  Constitutional:      Appearance: She is well-developed and well-nourished.  HENT:     Head: Normocephalic and atraumatic.  Eyes:     Extraocular Movements: EOM normal.     Conjunctiva/sclera: Conjunctivae normal.  Neck:     Thyroid: No thyromegaly.     Vascular: No carotid bruit or JVD.  Cardiovascular:     Rate and Rhythm: Normal rate and regular rhythm.     Heart sounds: Normal heart sounds. No murmur heard.   Pulmonary:     Effort: Pulmonary effort is normal. No respiratory distress.     Breath sounds: Normal breath sounds. No wheezing or  rales.  Chest:     Chest wall: No tenderness.  Musculoskeletal:        General: No edema.     Cervical back: Normal range of motion and neck supple.  Lymphadenopathy:     Cervical: No cervical adenopathy.  Neurological:     Mental Status: She is alert and oriented to person, place, and time.  Psychiatric:        Mood and Affect: Mood and affect normal.    Diabetic Foot Exam - Simple   Simple Foot Form Diabetic Foot exam was performed with the following findings: Yes 07/22/2020  2:31 PM  Visual Inspection No deformities, no ulcerations, no other skin breakdown bilaterally: Yes Sensation Testing Intact to touch and monofilament testing bilaterally: Yes Pulse Check Posterior Tibialis and Dorsalis pulse intact  bilaterally: Yes Comments     BP 110/60 (BP Location: Right Arm, Patient Position: Sitting, Cuff Size: Large)   Pulse 63   Temp 98.1 F (36.7 C) (Oral)   Resp 18   Ht 5' 2"  (1.575 m)   Wt 211 lb 6.4 oz (95.9 kg)   SpO2 99%   BMI 38.67 kg/m  Wt Readings from Last 3 Encounters:  07/22/20 211 lb 6.4 oz (95.9 kg)  07/15/20 208 lb (94.3 kg)  06/16/20 203 lb (92.1 kg)     Lab Results  Component Value Date   WBC 3.6 (L) 01/18/2020   HGB 12.1 01/18/2020   HCT 37.8 01/18/2020   PLT 241 01/18/2020   GLUCOSE 92 06/16/2020   CHOL 92 (L) 06/16/2020   TRIG 128 06/16/2020   HDL 49 06/16/2020   LDLCALC 21 06/16/2020   ALT 33 (H) 06/16/2020   AST 25 06/16/2020   NA 144 06/16/2020   K 4.0 06/16/2020   CL 104 06/16/2020   CREATININE 1.64 (H) 06/16/2020   BUN 32 (H) 06/16/2020   CO2 22 06/16/2020   TSH 3.38 01/18/2020   INR 1.0 03/29/2019   HGBA1C 6.3 (H) 06/16/2020   MICROALBUR 5.6 (H) 08/30/2016    CT MAXILLOFACIAL WO CONTRAST  Result Date: 10/06/2019 CLINICAL DATA:  64 year old with acute onset of RIGHT-sided facial swelling and RIGHT jaw pain with chewing. Renal insufficiency precludes IV contrast administration. EXAM: CT MAXILLOFACIAL WITHOUT CONTRAST TECHNIQUE: Multidetector CT imaging of the maxillofacial structures was performed. Multiplanar CT image reconstructions were also generated. A vitamin-E capsule was placed on the right temple in order to reliably differentiate right from left. COMPARISON:  None. FINDINGS: Osseous: No osseous abnormalities involving the facial bones. Temporomandibular joints anatomically aligned without degenerative changes. Orbits: Normal. Sinuses: Paranasal sinuses, BILATERAL mastoid air cells and BILATERAL middle ear cavities well-aerated. Bony nasal septal deviation to the LEFT. Soft tissues: No significant soft tissue swelling involving the RIGHT side of the face as questioned clinically. Lack of intravenous contrast makes the study less sensitive  for detection of abscess, but no abnormal fluid collection is identified. Limited intracranial: Unremarkable. IMPRESSION: No acute or significant abnormality. Electronically Signed   By: Evangeline Dakin M.D.   On: 10/06/2019 08:29     Assessment & Plan:  Plan  I am having Zehra B. Sanks maintain her rosuvastatin, omega-3 acid ethyl esters, Vitamin D3, Polyethyl Glycol-Propyl Glycol (SYSTANE OP), Biotin, loratadine, Prenatal Vit-Fe Fumarate-FA (PRENATAL VITAMIN PO), acetaminophen, diphenhydrAMINE, ferrous sulfate, levothyroxine, Saccharomyces boulardii (FLORASTOR PO), clobetasol, Probiotic Product (ALIGN PO), calcium citrate-vitamin D, albuterol, Melatonin, Nivestym, Tbo-Filgrastim, mupirocin ointment, aspirin EC, Rybelsus, rifaximin, and pregabalin.  Meds ordered this encounter  Medications  . pregabalin (  LYRICA) 50 MG capsule    Sig: Take 1 capsule (50 mg total) by mouth 2 (two) times daily.    Dispense:  60 capsule    Refill:  5    Problem List Items Addressed This Visit      Unprioritized   Benign essential HTN    Well controlled, no changes to meds. Encouraged heart healthy diet such as the DASH diet and exercise as tolerated.       Chronic pain   Relevant Medications   pregabalin (LYRICA) 50 MG capsule   DM type 2 (diabetes mellitus, type 2) (HCC)    hgba1c acceptable, minimize simple carbs. Increase exercise as tolerated. Continue current meds       HLD (hyperlipidemia)    Encouraged heart healthy diet, increase exercise, avoid trans fats, consider a krill oil cap daily Lab Results  Component Value Date   CHOL 92 (L) 06/16/2020   HDL 49 06/16/2020   LDLCALC 21 06/16/2020   TRIG 128 06/16/2020   CHOLHDL 1.9 01/18/2020           Follow-up: Return in about 6 months (around 01/20/2021), or if symptoms worsen or fail to improve, for annual exam, fasting.  Ann Held, DO

## 2020-07-29 ENCOUNTER — Encounter (INDEPENDENT_AMBULATORY_CARE_PROVIDER_SITE_OTHER): Payer: Self-pay

## 2020-07-29 ENCOUNTER — Other Ambulatory Visit (INDEPENDENT_AMBULATORY_CARE_PROVIDER_SITE_OTHER): Payer: Self-pay | Admitting: Family Medicine

## 2020-07-29 DIAGNOSIS — E1169 Type 2 diabetes mellitus with other specified complication: Secondary | ICD-10-CM

## 2020-07-29 NOTE — Telephone Encounter (Signed)
Message sent to pt.

## 2020-08-06 ENCOUNTER — Encounter: Payer: Self-pay | Admitting: *Deleted

## 2020-08-12 ENCOUNTER — Encounter (INDEPENDENT_AMBULATORY_CARE_PROVIDER_SITE_OTHER): Payer: Self-pay | Admitting: Family Medicine

## 2020-08-12 ENCOUNTER — Ambulatory Visit (INDEPENDENT_AMBULATORY_CARE_PROVIDER_SITE_OTHER): Payer: BC Managed Care – PPO | Admitting: Family Medicine

## 2020-08-12 ENCOUNTER — Other Ambulatory Visit: Payer: Self-pay

## 2020-08-12 VITALS — BP 142/81 | HR 72 | Temp 97.5°F | Ht 62.0 in | Wt 210.0 lb

## 2020-08-12 DIAGNOSIS — E559 Vitamin D deficiency, unspecified: Secondary | ICD-10-CM | POA: Diagnosis not present

## 2020-08-12 DIAGNOSIS — E1169 Type 2 diabetes mellitus with other specified complication: Secondary | ICD-10-CM | POA: Diagnosis not present

## 2020-08-12 DIAGNOSIS — Z6838 Body mass index (BMI) 38.0-38.9, adult: Secondary | ICD-10-CM | POA: Diagnosis not present

## 2020-08-13 NOTE — Telephone Encounter (Signed)
RX loaded to be signed off in [place] note from yesterday

## 2020-08-13 NOTE — Progress Notes (Signed)
Chief Complaint:   OBESITY [name] is here to discuss her progress with her obesity treatment plan along with follow-up of her obesity related diagnoses. [name] is on keeping a food journal and adhering to recommended goals of 1200-1300 calories and 75+ grams of protein daily and states she is following her eating plan approximately 50% of the time. [name] states she is doing 0 minutes 0 times per week.  Today's visit was #: 18 Starting weight: 208 lbs Starting date: 04/05/2018 Today's weight: 210 lbs Today's date: 08/12/2020 Total lbs lost to date: 0 Total lbs lost since last in-office visit: 0  Interim History: [name] did some celebration eating over the holidays and gained a few pounds, but she has already gotten back on track with her weight loss efforts. She notes some increased hunger and she is feeling less satisfied, which may be due to decreased protein and increased simple carbohydrates.  Subjective:   1. Type 2 diabetes mellitus with other specified complication, without long-term current use of insulin ([place]) [name] notes increased polyphagia recently. She is tolerating Rybelsus well.  2. Vitamin D deficiency [name] is on OTC Vit D, and he last Vit D level was not yet at goal.  Assessment/Plan:   1. Type 2 diabetes mellitus with other specified complication, without long-term current use of insulin (HCC) Good blood sugar control is important to decrease the likelihood of diabetic complications such as nephropathy, neuropathy, limb loss, blindness, coronary artery disease, and death. Intensive lifestyle modification including diet, exercise and weight loss are the first line of treatment for diabetes. [name] agreed to increase Rybelsus to 14 mg q daily #90 with no refills.  2. Vitamin D deficiency Low Vitamin D level contributes to fatigue and are associated with obesity, breast, and colon cancer. [name] agreed to continue taking OTC Vitamin D and we will recheck labs in 1 month.  She will follow-up for routine testing of Vitamin D, at least 2-3 times per year to avoid over-replacement.  3. Class 2 severe obesity with serious comorbidity and body mass index (BMI) of 38.0 to 38.9 in adult, unspecified obesity type (HCC) [name] is currently in the action stage of change. As such, her goal is to continue with weight loss efforts. She has agreed to the Category 2 Plan or keeping a food journal and adhering to recommended goals of 1200-1300 calories and 75+ grams of protein daily.   Behavioral modification strategies: increasing lean protein intake, meal planning and cooking strategies and emotional eating strategies.  [name] has agreed to follow-up with our clinic in 2 to 3 weeks. She was informed of the importance of frequent follow-up visits to maximize her success with intensive lifestyle modifications for her multiple health conditions.   Objective:   Blood pressure (!) 142/81, pulse 72, temperature (!) 97.5 F (36.4 C), temperature source Oral, height 5\' 2"  (1.575 m), weight 210 lb (95.3 kg), SpO2 98 %. Body mass index is 38.41 kg/m.  General: Cooperative, alert, well developed, in no acute distress. HEENT: Conjunctivae and lids unremarkable. Cardiovascular: Regular rhythm.  Lungs: Normal work of breathing. Neurologic: No focal deficits.   Lab Results  Component Value Date   CREATININE 1.64 (H) 06/16/2020   BUN 32 (H) 06/16/2020   NA 144 06/16/2020   K 4.0 06/16/2020   CL 104 06/16/2020   CO2 22 06/16/2020   Lab Results  Component Value Date   ALT 33 (H) 06/16/2020   AST 25 06/16/2020   ALKPHOS 175 (H) 06/16/2020  BILITOT 0.3 06/16/2020   Lab Results  Component Value Date   HGBA1C 6.3 (H) 06/16/2020   HGBA1C 7.9 (H) 09/01/2017   HGBA1C 5.3 04/01/2017   HGBA1C 5.8 (H) 09/05/2015   HGBA1C 6.8 (H) 11/21/2014   Lab Results  Component Value Date   INSULIN 14.1 06/16/2020   Lab Results  Component Value Date   TSH 3.38 01/18/2020   Lab Results   Component Value Date   CHOL 92 (L) 06/16/2020   HDL 49 06/16/2020   LDLCALC 21 06/16/2020   TRIG 128 06/16/2020   CHOLHDL 1.9 01/18/2020   Lab Results  Component Value Date   WBC 3.6 (L) 01/18/2020   HGB 12.1 01/18/2020   HCT 37.8 01/18/2020   MCV 95.2 01/18/2020   PLT 241 01/18/2020   Lab Results  Component Value Date   IRON 7 (L) 08/31/2017   TIBC 164 (L) 08/31/2017   FERRITIN 27 11/29/2014    Obesity Behavioral Intervention:   Approximately 15 minutes were spent on the discussion below.  ASK: We discussed the diagnosis of obesity with [name] today and [name] agreed to give [place] permission to discuss obesity behavioral modification therapy today.  ASSESS: [name] has the diagnosis of obesity and her BMI today is 38.4. [name] is in the action stage of change.   ADVISE: [name] was educated on the multiple health risks of obesity as well as the benefit of weight loss to improve her health. She was advised of the need for long term treatment and the importance of lifestyle modifications to improve her current health and to decrease her risk of future health problems.  AGREE: Multiple dietary modification options and treatment options were discussed and [name] agreed to follow the recommendations documented in the above note.  ARRANGE: [name] was educated on the importance of frequent visits to treat obesity as outlined per CMS and USPSTF guidelines and agreed to schedule her next follow up appointment today.  Attestation Statements:   Reviewed by clinician on day of visit: allergies, medications, problem list, medical history, surgical history, family history, social history, and previous encounter notes.   I, Trixie Dredge, am acting as transcriptionist for Dennard Nip, MD.  I have reviewed the above documentation for accuracy and completeness, and I agree with the above. -  Dennard Nip, MD

## 2020-08-14 ENCOUNTER — Telehealth: Payer: Self-pay | Admitting: *Deleted

## 2020-08-14 NOTE — Telephone Encounter (Signed)
Granix 480 mcg PA submitted 08/05/2020, resubmitted 08/07/2020 by this nurse without current notification of outcome.    Connected with Camarie Mctigue Hirano [phone]).  Received confirmation of "receipt of Granix 480 mcg from [facility] late last night".

## 2020-08-19 ENCOUNTER — Other Ambulatory Visit: Payer: Self-pay | Admitting: Urology

## 2020-08-20 ENCOUNTER — Other Ambulatory Visit: Payer: Self-pay | Admitting: Urology

## 2020-08-20 DIAGNOSIS — N1339 Other hydronephrosis: Secondary | ICD-10-CM

## 2020-08-20 MED ORDER — RYBELSUS 14 MG PO TABS: 1.0000 | ORAL_TABLET | Freq: Every day | ORAL | 0 refills | Status: DC

## 2020-08-21 ENCOUNTER — Ambulatory Visit
Admission: RE | Admit: 2020-08-21 | Discharge: 2020-08-21 | Disposition: A | Discharge status: Home / Self Care | Payer: BC Managed Care – PPO | Source: Ambulatory Visit | Attending: Urology | Admitting: Urology

## 2020-08-21 DIAGNOSIS — N1339 Other hydronephrosis: Secondary | ICD-10-CM

## 2020-08-21 DIAGNOSIS — N1330 Unspecified hydronephrosis: Secondary | ICD-10-CM | POA: Diagnosis not present

## 2020-08-25 DIAGNOSIS — Z86010 Personal history of colonic polyps: Secondary | ICD-10-CM | POA: Diagnosis not present

## 2020-08-25 DIAGNOSIS — N321 Vesicointestinal fistula: Secondary | ICD-10-CM | POA: Diagnosis not present

## 2020-08-25 DIAGNOSIS — D708 Other neutropenia: Secondary | ICD-10-CM | POA: Diagnosis not present

## 2020-08-25 DIAGNOSIS — R143 Flatulence: Secondary | ICD-10-CM | POA: Diagnosis not present

## 2020-08-26 DIAGNOSIS — L89319 Pressure ulcer of right buttock, unspecified stage: Secondary | ICD-10-CM | POA: Diagnosis not present

## 2020-08-26 DIAGNOSIS — Z936 Other artificial openings of urinary tract status: Secondary | ICD-10-CM | POA: Diagnosis not present

## 2020-08-26 DIAGNOSIS — Z4801 Encounter for change or removal of surgical wound dressing: Secondary | ICD-10-CM | POA: Diagnosis not present

## 2020-08-26 DIAGNOSIS — Z933 Colostomy status: Secondary | ICD-10-CM | POA: Diagnosis not present

## 2020-08-27 ENCOUNTER — Other Ambulatory Visit: Payer: Self-pay

## 2020-08-27 ENCOUNTER — Encounter (INDEPENDENT_AMBULATORY_CARE_PROVIDER_SITE_OTHER): Payer: Self-pay | Admitting: Family Medicine

## 2020-08-27 ENCOUNTER — Ambulatory Visit (INDEPENDENT_AMBULATORY_CARE_PROVIDER_SITE_OTHER): Payer: BC Managed Care – PPO | Admitting: Family Medicine

## 2020-08-27 VITALS — BP 118/61 | HR 68 | Temp 97.8°F | Ht 62.0 in | Wt 209.0 lb

## 2020-08-27 DIAGNOSIS — Z6838 Body mass index (BMI) 38.0-38.9, adult: Secondary | ICD-10-CM | POA: Diagnosis not present

## 2020-08-27 DIAGNOSIS — E1169 Type 2 diabetes mellitus with other specified complication: Secondary | ICD-10-CM

## 2020-08-28 NOTE — Progress Notes (Signed)
Chief Complaint:   OBESITY [name] is here to discuss her progress with her obesity treatment plan along with follow-up of her obesity related diagnoses. [name] is on the Category 2 Plan or keeping a food journal and adhering to recommended goals of 1200-1300 calories and 75+ grams of protein daily and states she is following her eating plan approximately 75% of the time. [name] states she is doing 0 minutes 0 times per week.  Today's visit was #: 63 Starting weight: 208 lbs Starting date: 04/05/2018 Today's weight: 209 lbs Today's date: 08/27/2020 Total lbs lost to date: 0 Total lbs lost since last in-office visit: 1  Interim History: [name] continues to do well with weight loss even with the snow storm and other challenges. She is journaling and trying to follow the Category 2 plan.  Subjective:   1. Type 2 diabetes mellitus with other specified complication, without long-term current use of insulin (HCC) [name] recently increased Rybelsus to 14 mg, and she feels this causes less polyphagia. Her fasting BGs mostly range in 130's. She denies hypoglycemia.  Assessment/Plan:   1. Type 2 diabetes mellitus with other specified complication, without long-term current use of insulin (HCC) Good blood sugar control is important to decrease the likelihood of diabetic complications such as nephropathy, neuropathy, limb loss, blindness, coronary artery disease, and death. Intensive lifestyle modification including diet, exercise and weight loss are the first line of treatment for diabetes. [name] with continue diet and exercise, and will continue her medications. We will continue to monitor.  2. Class 2 severe obesity with serious comorbidity and body mass index (BMI) of 38.0 to 38.9 in adult, unspecified obesity type (HCC) [name] is currently in the action stage of change. As such, her goal is to continue with weight loss efforts. She has agreed to the Category 2 Plan or keeping a food journal and  adhering to recommended goals of 1200-1300 calories and 75+ grams of protein daily.   Behavioral modification strategies: increasing lean protein intake and meal planning and cooking strategies.  [name] has agreed to follow-up with our clinic in 3 weeks. She was informed of the importance of frequent follow-up visits to maximize her success with intensive lifestyle modifications for her multiple health conditions.   Objective:   Blood pressure 118/61, pulse 68, temperature 97.8 F (36.6 C), height 5\' 2"  (1.575 m), weight 209 lb (94.8 kg), SpO2 98 %. Body mass index is 38.23 kg/m.  General: Cooperative, alert, well developed, in no acute distress. HEENT: Conjunctivae and lids unremarkable. Cardiovascular: Regular rhythm.  Lungs: Normal work of breathing. Neurologic: No focal deficits.   Lab Results  Component Value Date   CREATININE 1.64 (H) 06/16/2020   BUN 32 (H) 06/16/2020   NA 144 06/16/2020   K 4.0 06/16/2020   CL 104 06/16/2020   CO2 22 06/16/2020   Lab Results  Component Value Date   ALT 33 (H) 06/16/2020   AST 25 06/16/2020   ALKPHOS 175 (H) 06/16/2020   BILITOT 0.3 06/16/2020   Lab Results  Component Value Date   HGBA1C 6.3 (H) 06/16/2020   HGBA1C 7.9 (H) 09/01/2017   HGBA1C 5.3 04/01/2017   HGBA1C 5.8 (H) 09/05/2015   HGBA1C 6.8 (H) 11/21/2014   Lab Results  Component Value Date   INSULIN 14.1 06/16/2020   Lab Results  Component Value Date   TSH 3.38 01/18/2020   Lab Results  Component Value Date   CHOL 92 (L) 06/16/2020   HDL 49 06/16/2020  [place] 21 06/16/2020   TRIG 128 06/16/2020   CHOLHDL 1.9 01/18/2020   Lab Results  Component Value Date   WBC 3.6 (L) 01/18/2020   HGB 12.1 01/18/2020   HCT 37.8 01/18/2020   MCV 95.2 01/18/2020   PLT 241 01/18/2020   Lab Results  Component Value Date   IRON 7 (L) 08/31/2017   TIBC 164 (L) 08/31/2017   FERRITIN 27 11/29/2014   Attestation Statements:   Reviewed by clinician on day of visit:  allergies, medications, problem list, medical history, surgical history, family history, social history, and previous encounter notes.  Time spent on visit including pre-visit chart review and post-visit care and charting was 20 minutes.    I, Trixie Dredge, am acting as transcriptionist for Dennard Nip, MD.  I have reviewed the above documentation for accuracy and completeness, and I agree with the above. -  Dennard Nip, MD

## 2020-09-03 DIAGNOSIS — Z1211 Encounter for screening for malignant neoplasm of colon: Secondary | ICD-10-CM | POA: Diagnosis not present

## 2020-09-03 DIAGNOSIS — C541 Malignant neoplasm of endometrium: Secondary | ICD-10-CM | POA: Diagnosis not present

## 2020-09-03 DIAGNOSIS — N321 Vesicointestinal fistula: Secondary | ICD-10-CM | POA: Diagnosis not present

## 2020-09-03 DIAGNOSIS — Z9889 Other specified postprocedural states: Secondary | ICD-10-CM | POA: Diagnosis not present

## 2020-09-18 ENCOUNTER — Encounter (INDEPENDENT_AMBULATORY_CARE_PROVIDER_SITE_OTHER): Payer: Self-pay | Admitting: Family Medicine

## 2020-09-18 ENCOUNTER — Ambulatory Visit (INDEPENDENT_AMBULATORY_CARE_PROVIDER_SITE_OTHER): Payer: BC Managed Care – PPO | Admitting: Family Medicine

## 2020-09-18 ENCOUNTER — Other Ambulatory Visit: Payer: Self-pay

## 2020-09-18 VITALS — BP 118/72 | HR 70 | Temp 98.2°F | Ht 62.0 in | Wt 209.0 lb

## 2020-09-18 DIAGNOSIS — F3289 Other specified depressive episodes: Secondary | ICD-10-CM

## 2020-09-18 DIAGNOSIS — Z6838 Body mass index (BMI) 38.0-38.9, adult: Secondary | ICD-10-CM

## 2020-09-18 MED ORDER — BUPROPION HCL ER (SR) 150 MG PO TB12: 150.0000 mg | ORAL_TABLET | Freq: Every day | ORAL | 0 refills | Status: DC

## 2020-09-19 DIAGNOSIS — L89319 Pressure ulcer of right buttock, unspecified stage: Secondary | ICD-10-CM | POA: Diagnosis not present

## 2020-09-19 DIAGNOSIS — Z933 Colostomy status: Secondary | ICD-10-CM | POA: Diagnosis not present

## 2020-09-22 DIAGNOSIS — Z933 Colostomy status: Secondary | ICD-10-CM | POA: Diagnosis not present

## 2020-09-22 DIAGNOSIS — L89319 Pressure ulcer of right buttock, unspecified stage: Secondary | ICD-10-CM | POA: Diagnosis not present

## 2020-09-22 DIAGNOSIS — C541 Malignant neoplasm of endometrium: Secondary | ICD-10-CM | POA: Diagnosis not present

## 2020-09-22 NOTE — Progress Notes (Signed)
Chief Complaint:   OBESITY [name] is here to discuss her progress with her obesity treatment plan along with follow-up of her obesity related diagnoses. [name] is on the Category 2 Plan or keeping a food journal and adhering to recommended goals of 1200-1300 calories and 75+ grams of protein daily and states she is following her eating plan approximately 75% of the time. [name] states she is doing 0 minutes 0 times per week.  Today's visit was #: 68 Starting weight: 208 lbs Starting date: 04/05/2018 Today's weight: 209 lbs Today's date: 09/18/2020 Total lbs lost to date: 0 Total lbs lost since last in-office visit: 0  Interim History: [name] has done well avoiding weight gain. She is retaining a bit of fluid today and has been working on increasing lean protein. She is getting good support from her husband.  Subjective:   1. Other depression, with emotional eating [name] notes increased emotional eating. She is feeling deprived at times and frustrated with herself. She denies a history of seizures.  Assessment/Plan:   1. Other depression, with emotional eating Behavior modification techniques were discussed today to help [name] deal with her emotional/non-hunger eating behaviors. [name] agreed to start Wellbutrin SR 150 mg q daily #30 with no refills. Orders and follow up as documented in patient record.   2. Class 2 severe obesity with serious comorbidity and body mass index (BMI) of 38.0 to 38.9 in adult, unspecified obesity type (HCC) [name] is currently in the action stage of change. As such, her goal is to continue with weight loss efforts. She has agreed to the Category 2 Plan or keeping a food journal and adhering to recommended goals of 1200-1300 calories and 75+ grams of protein daily.   Behavioral modification strategies: emotional eating strategies.  [name] has agreed to follow-up with our clinic in 4 weeks. She was informed of the importance of frequent follow-up visits to  maximize her success with intensive lifestyle modifications for her multiple health conditions.   Objective:   Blood pressure 118/72, pulse 70, temperature 98.2 F (36.8 C), height 5\' 2"  (1.575 m), weight 209 lb (94.8 kg), SpO2 99 %. Body mass index is 38.23 kg/m.  General: Cooperative, alert, well developed, in no acute distress. HEENT: Conjunctivae and lids unremarkable. Cardiovascular: Regular rhythm.  Lungs: Normal work of breathing. Neurologic: No focal deficits.   Lab Results  Component Value Date   CREATININE 1.64 (H) 06/16/2020   BUN 32 (H) 06/16/2020   NA 144 06/16/2020   K 4.0 06/16/2020   CL 104 06/16/2020   CO2 22 06/16/2020   Lab Results  Component Value Date   ALT 33 (H) 06/16/2020   AST 25 06/16/2020   ALKPHOS 175 (H) 06/16/2020   BILITOT 0.3 06/16/2020   Lab Results  Component Value Date   HGBA1C 6.3 (H) 06/16/2020   HGBA1C 7.9 (H) 09/01/2017   HGBA1C 5.3 04/01/2017   HGBA1C 5.8 (H) 09/05/2015   HGBA1C 6.8 (H) 11/21/2014   Lab Results  Component Value Date   INSULIN 14.1 06/16/2020   Lab Results  Component Value Date   TSH 3.38 01/18/2020   Lab Results  Component Value Date   CHOL 92 (L) 06/16/2020   HDL 49 06/16/2020   LDLCALC 21 06/16/2020   TRIG 128 06/16/2020   CHOLHDL 1.9 01/18/2020   Lab Results  Component Value Date   WBC 3.6 (L) 01/18/2020   HGB 12.1 01/18/2020   HCT 37.8 01/18/2020   MCV 95.2 01/18/2020  PLT 241 01/18/2020   Lab Results  Component Value Date   IRON 7 (L) 08/31/2017   TIBC 164 (L) 08/31/2017   FERRITIN 27 11/29/2014   Attestation Statements:   Reviewed by clinician on day of visit: allergies, medications, problem list, medical history, surgical history, family history, social history, and previous encounter notes.  Time spent on visit including pre-visit chart review and post-visit care and charting was 30 minutes.    I, Trixie Dredge, am acting as transcriptionist for Dennard Nip, MD.  I have  reviewed the above documentation for accuracy and completeness, and I agree with the above. -  Dennard Nip, MD

## 2020-09-27 DIAGNOSIS — C541 Malignant neoplasm of endometrium: Secondary | ICD-10-CM | POA: Diagnosis not present

## 2020-09-30 ENCOUNTER — Other Ambulatory Visit: Payer: Self-pay

## 2020-09-30 ENCOUNTER — Inpatient Hospital Stay: Payer: BC Managed Care – PPO

## 2020-09-30 ENCOUNTER — Other Ambulatory Visit: Payer: Self-pay | Admitting: Hematology and Oncology

## 2020-09-30 ENCOUNTER — Inpatient Hospital Stay: Payer: BC Managed Care – PPO | Attending: Hematology and Oncology | Admitting: Hematology and Oncology

## 2020-09-30 ENCOUNTER — Encounter: Payer: Self-pay | Admitting: Hematology and Oncology

## 2020-09-30 DIAGNOSIS — Z170 Estrogen receptor positive status [ER+]: Secondary | ICD-10-CM | POA: Insufficient documentation

## 2020-09-30 DIAGNOSIS — N950 Postmenopausal bleeding: Secondary | ICD-10-CM | POA: Diagnosis not present

## 2020-09-30 DIAGNOSIS — C50212 Malignant neoplasm of upper-inner quadrant of left female breast: Secondary | ICD-10-CM

## 2020-09-30 DIAGNOSIS — Z79811 Long term (current) use of aromatase inhibitors: Secondary | ICD-10-CM | POA: Diagnosis not present

## 2020-09-30 DIAGNOSIS — D709 Neutropenia, unspecified: Secondary | ICD-10-CM

## 2020-09-30 LAB — CBC WITH DIFFERENTIAL/PLATELET
Abs Immature Granulocytes: 0.03 10*3/uL (ref 0.00–0.07)
Basophils Absolute: 0.1 10*3/uL (ref 0.0–0.1)
Basophils Relative: 1 %
Eosinophils Absolute: 0.1 10*3/uL (ref 0.0–0.5)
Eosinophils Relative: 2 %
HCT: 37 % (ref 36.0–46.0)
Hemoglobin: 12 g/dL (ref 12.0–15.0)
Immature Granulocytes: 1 %
Lymphocytes Relative: 16 %
Lymphs Abs: 1 10*3/uL (ref 0.7–4.0)
MCH: 31 pg (ref 26.0–34.0)
MCHC: 32.4 g/dL (ref 30.0–36.0)
MCV: 95.6 fL (ref 80.0–100.0)
Monocytes Absolute: 0.4 10*3/uL (ref 0.1–1.0)
Monocytes Relative: 6 %
Neutro Abs: 4.9 10*3/uL (ref 1.7–7.7)
Neutrophils Relative %: 74 %
Platelets: 195 10*3/uL (ref 150–400)
RBC: 3.87 MIL/uL (ref 3.87–5.11)
RDW: 13.3 % (ref 11.5–15.5)
WBC: 6.5 10*3/uL (ref 4.0–10.5)
nRBC: 0 % (ref 0.0–0.2)

## 2020-09-30 NOTE — Assessment & Plan Note (Signed)
She has chronic vaginal bleeding since a few months ago Her recent MRI at [facility] last week show evidence of fistula I would defer to her surgeon for management So far, she is not anemic

## 2020-09-30 NOTE — Progress Notes (Signed)
Welcome OFFICE PROGRESS NOTE  Patient Care Team: Carollee Herter, Alferd Apa, DO as PCP - General (Family Medicine) Belva Crome, MD as PCP - Cardiology (Cardiology) Heath Lark, MD as Consulting Physician (Hematology and Oncology) Everitt Amber, MD as Consulting Physician (Obstetrics and Gynecology) Michael [name], MD as Consulting Physician (General Surgery) Alexis Frock, MD as Consulting Physician (Urology) Altheimer, Legrand Como, MD as Consulting Physician (Endocrinology) Katy Apo, MD as Consulting Physician (Ophthalmology) Irene Limbo, MD as Consulting Physician (Plastic Surgery) Madelon Lips, MD as Consulting Physician (Nephrology) Starlyn Skeans, MD as Consulting Physician (Family Medicine) Rocky Morel, MD as Referring Physician (Plastic Surgery) Belva Crome, MD as Consulting Physician (Cardiology) Ponciano Ort, MD as Referring Physician (Urology)  ASSESSMENT & PLAN:  Breast cancer of upper-inner quadrant of left female breast [facility]) We discussed the risk and benefits of discontinuation of anastrozole after 5 years She is in agreement to stop in July of this year  Chronic neutropenia [facility]) She is doing well with Granix injection every 6 days We will continue indefinitely Chronic treatment with Granix has reduce the risk of hospitalization and sepsis  Postmenopausal bleeding She has chronic vaginal bleeding since a few months ago Her recent MRI at [facility] last week show evidence of fistula I would defer to her surgeon for management So far, she is not anemic   No orders of the defined types were placed in this encounter.   All questions were answered. The patient knows to call the clinic with any problems, questions or concerns. The total time spent in the appointment was 20 minutes encounter with patients including review of chart and various tests results, discussions about plan of care and coordination of care plan   Heath Lark, MD 09/30/2020 3:14 PM  INTERVAL HISTORY: Please see below for problem oriented charting. She returns for further follow-up for history of ER positive breast cancer and chronic leukopenia due to prior chemotherapy on G-CSF support She is doing well from breast cancer standpoint From the leukopenia standpoint, her injection of G-CSF every 6 days has prevented infection and sepsis Her only complaint recently is vaginal bleeding for the past few months She had recent MRI done which show evidence of fistula She has not seen [place] since her MRI  SUMMARY OF ONCOLOGIC HISTORY: Oncology History Overview Note  Breast cancer of upper-inner quadrant of left female breast ([place])   Staging form: Breast, AJCC 7th Edition     Clinical stage from 12/31/2015: Stage IIA (T2, N0, M0) - Signed by Heath Lark, MD on 12/31/2015  ER,PR positive Her2 neg   History of ovarian & endometrial cancer  10/15/2004 Initial Diagnosis   History of ovarian cancer, treated with chemotherapy carbo/Taxol   03/17/2009 Bone Marrow Biopsy   Bone marrow biopsy at [facility] showed neutropenia   01/23/2014 Bone Marrow Biopsy   Repeat bone marrow biopsy showed neutropenia   09/12/2014 Imaging   CT scan showed possible cancer   10/14/2014 Imaging   MRI show significant pelvic mass without invasion into the bladder but abutting to the rectum   11/19/2014 Surgery   she underwent surgery and had robotic-assisted lysis of adhesions, converted to laparotomy, radical upper vaginectomy and low anterior resection with colostomy. 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 Resection margins were negative   01/29/2015 - 03/10/2015 Radiation Therapy  She received adjuvant radiation   03/03/2015 Imaging   CT scan of the abdomen and  pelvis showed status post interval removal of the bilateral nephroureteral stents. Worsening moderate right hydroureteronephrosis. Resolved left hydroureteronephrosis.   03/20/2015 Procedure   she had cystoscopy and stent placement for right hydronephrosis   05/02/2015 Surgery   Cystoscopy with right retrograde pyelogram interpretation. Diagnostic ureteroscopy. Removal of right ureteral stent.   05/14/2015 Imaging   CT scan of the abdomen and pelvis show unilateral right hydronephrosis with no residual cancer   08/18/2015 Imaging   CT scan showed hysterectomy with stable presacral soft tissue thickening. No definitive evidence of recurrent or metastatic disease. Severe right hydronephrosis   09/12/2015 Surgery   Cystoscopy with right retrograde pyelogram interpretation. Right ureteral stent placement 5 x 24 Polaris, no tether   10/07/2016 Imaging   Stable exam.  No new or progressive findings. 2. Stable appearance abnormal presacral soft tissue compatible with post treatment change. 3. Internal right ureteral stent with persistent right hydroureteronephrosis. Differentially decreased perfusion of the right kidney suggests a component of underlying obstructive uropathy. 4. Stable 13 mm left adrenal nodule, previously characterized as adenoma.     01/27/2017 Imaging   Stable postop and post radiation changes in presacral region. No evidence of recurrent or metastatic carcinoma within the chest, abdomen, or pelvis. Stable mild to moderate right hydroureteronephrosis, with right ureteral stent in appropriate position. Stable small benign left adrenal adenoma and left thyroid lobe nodule.   10/02/2019 Tumor Marker   Patient's tumor was tested for the following markers: CA-125 Results of the tumor marker test revealed 5.8   Breast cancer of upper-inner quadrant of left female breast ([place])  10/14/2015 Imaging   Screening mammogram showed possible distortion in the left breast.   10/24/2015 Imaging    Targeted ultrasound is performed, showing a 0.6 x 0.8 x 0.9 cm area of hypoechoic distortion at the 10 o'clock position of the left breast 5 cm from the nipple   10/24/2015 Imaging   Diagnostic imaging confirmed 0.6 x 0.8 x 0.9 cm distortion in the upper inner left breast   10/30/2015 Procedure   Left US guided biopsy was performed   10/30/2015 Pathology Results   Accession: [id number] showed invasive ductal carcinoma, ER/PR positive, Her2 neg   11/05/2015 Genetic Testing   Genetic testing did not reveal a deleterious mutation.  Genetic testing did detect a Variant of Unknown Significance in the MSH6 gene called [id number]..  Genes tested include: APC, ATM, AXIN2, BARD1, BMPR1A, BRCA1, BRCA2, BRIP1, CDH1, CDKN2A, CHEK2, DICER1, EPCAM, GREM1, KIT, MEN1, MLH1, MSH2, MSH6, MUTYH, NBN, NF1, PALB2, PDGFRA, PMS2, POLD1, POLE, PTEN, RAD50, RAD51C, RAD51D, SDHA, SDHB, SDHC, SDHD, SMAD4, SMARCA4. STK11, TP53, TSC1, TSC2, and VHL.   12/11/2015 Surgery   She undwerwent bilateral mastectomy, left SLN biopsy and immediate reconstruction surgeries with bilateral plasma expanders   12/11/2015 Pathology Results   Accession: [id number] mastectomy specimens showed left breast ca, pT2N0M0   12/26/2015 Pathology Results   Accession: [id number] specimen from debridement showed no cancer   12/26/2015 Surgery   she underwent surgical debridement of necrotic tissue at site of plasma expander   01/12/2016 Imaging   Bone density is normal   02/02/2016 -  Anti-estrogen oral therapy   She started taking Arimidex   10/21/2017 Procedure   Ultrasound guided biopsy of a vague hypoechoic shadowing lesion, generally corresponding to area palpable abnormality, along chest lateral to the reconstructed left breast and inferior to the left axilla. No apparent  complications.   01/17/2018 Imaging   Bone density scan is normal     REVIEW OF SYSTEMS:   Constitutional: Denies fevers, chills or abnormal weight loss Eyes: Denies  blurriness of vision Ears, nose, mouth, throat, and face: Denies mucositis or sore throat Respiratory: Denies cough, dyspnea or wheezes Cardiovascular: Denies palpitation, chest discomfort or lower extremity swelling Gastrointestinal:  Denies nausea, heartburn or change in bowel habits Skin: Denies abnormal skin rashes Lymphatics: Denies new lymphadenopathy or easy bruising Neurological:Denies numbness, tingling or new weaknesses Behavioral/Psych: Mood is stable, no new changes  All other systems were reviewed with the patient and are negative.  I have reviewed the past medical history, past surgical history, social history and family history with the patient and they are unchanged from previous note.  ALLERGIES:  is allergic to penicillins, cefaclor, erythromycin, tape, trimethoprim, ultram [tramadol], zarxio [filgrastim], cephalosporins, fluconazole, neomycin, oxycodone, pectin, septra [sulfamethoxazole-trimethoprim], and sulfa antibiotics.  MEDICATIONS:  Current Outpatient Medications  Medication Sig Dispense Refill  . acetaminophen (TYLENOL) 325 MG tablet Take 2 tablets (650 mg total) by mouth every 6 (six) hours as needed. 30 tablet 1  . albuterol (PROVENTIL HFA) 108 (90 Base) MCG/ACT inhaler Inhale 1 puff into the lungs every 6 (six) hours as needed for wheezing or shortness of breath. 18 g 2  . aspirin EC 81 MG tablet Take 1 tablet (81 mg total) by mouth every Monday, Wednesday, and Friday. Swallow whole. 90 tablet 3  . Biotin 5 MG TABS Take 5 mg by mouth daily.     [name] Kitchen buPROPion (WELLBUTRIN SR) 150 MG 12 hr tablet Take 1 tablet (150 mg total) by mouth daily. 30 tablet 0  . calcium citrate-vitamin D (CITRACAL+D) 315-200 MG-UNIT tablet Take 1 tablet by mouth daily.    . Cholecalciferol (VITAMIN D3) 10000 UNITS capsule Take 10,000 Units by mouth See admin instructions. Take 1 tablet by mouth 4 times per week.    . clobetasol (OLUX) 0.05 % topical foam Apply topically 2 (two) times daily.     . diphenhydrAMINE (BENADRYL) 25 mg capsule Take 1 capsule (25 mg total) by mouth every 8 (eight) hours as needed for itching, allergies or sleep. 30 capsule 0  . ferrous sulfate 325 (65 FE) MG tablet Take 1 tablet (325 mg total) by mouth at bedtime. 30 tablet 3  . levothyroxine (SYNTHROID, LEVOTHROID) 150 MCG tablet Take 1 tablet (150 mcg total) by mouth daily before breakfast. 30 tablet 1  . loratadine (CLARITIN) 10 MG tablet Take 10 mg by mouth daily.     . Melatonin 10 MG TABS Take 1 tablet by mouth at bedtime as needed.    . mupirocin ointment (BACTROBAN) 2 % Place 1 application into the nose 2 (two) times daily. 22 g 0  . omega-3 acid ethyl esters (LOVAZA) 1 G capsule Take 1 g by mouth 2 (two) times daily.     Vladimir Faster Glycol-Propyl Glycol (SYSTANE OP) Place 1 drop into both eyes daily as needed (dry eyes).     . pregabalin (LYRICA) 50 MG capsule Take 1 capsule (50 mg total) by mouth 2 (two) times daily. 60 capsule 5  . Prenatal Vit-Fe Fumarate-FA (PRENATAL VITAMIN PO) Take 1 capsule by mouth daily. Takes prenatal because there are no dyes in it    . Probiotic Product (ALIGN PO) Take by mouth 2 (two) times daily.    . rifaximin (XIFAXAN) 550 MG TABS tablet Take 550 mg by mouth See admin instructions. Take one tablet by mouth  three times daily for 14 days when needed.    . rosuvastatin (CRESTOR) 10 MG tablet Take 10 mg by mouth every evening.     . Saccharomyces boulardii (FLORASTOR PO) Take 1 capsule by mouth daily.    . Semaglutide (RYBELSUS) 14 MG TABS Take 1 tablet by mouth daily. 90 tablet 0  . Tbo-Filgrastim (GRANIX) 480 MCG/0.8ML SOSY injection 480 mcg subcutaneous injection every 6 days life long 11.2 mL 11   No current facility-administered medications for this visit.    PHYSICAL EXAMINATION: ECOG PERFORMANCE STATUS: 1 - Symptomatic but completely ambulatory  Vitals:   09/30/20 1126  BP: (!) 127/59  Pulse: 68  Resp: 18  Temp: 98 F (36.7 C)  SpO2: 99%   Filed  Weights   09/30/20 1126  Weight: 213 lb (96.6 kg)    GENERAL:alert, no distress and comfortable NEURO: alert & oriented x 3 with fluent speech, no focal motor/sensory deficits  LABORATORY DATA:  I have reviewed the data as listed    Component Value Date/Time   NA 144 06/16/2020 0834   NA 141 01/24/2017 1228   K 4.0 06/16/2020 0834   K 4.6 01/24/2017 1228   CL 104 06/16/2020 0834   CO2 22 06/16/2020 0834   CO2 29 01/24/2017 1228   GLUCOSE 92 06/16/2020 0834   GLUCOSE 126 (H) 01/18/2020 1440   GLUCOSE 84 01/24/2017 1228   BUN 32 (H) 06/16/2020 0834   BUN 22.2 01/24/2017 1228   CREATININE 1.64 (H) 06/16/2020 0834   CREATININE 1.91 (H) 01/18/2020 1440   CREATININE 0.8 01/24/2017 1228   CALCIUM 9.4 06/16/2020 0834   CALCIUM 10.2 01/24/2017 1228   PROT 7.8 06/16/2020 0834   PROT 6.9 01/24/2017 1228   ALBUMIN 4.4 06/16/2020 0834   ALBUMIN 3.4 (L) 01/24/2017 1228   AST 25 06/16/2020 0834   AST 14 (L) 03/27/2018 1324   AST 16 01/24/2017 1228   ALT 33 (H) 06/16/2020 0834   ALT 18 03/27/2018 1324   ALT 14 01/24/2017 1228   ALKPHOS 175 (H) 06/16/2020 0834   ALKPHOS 90 01/24/2017 1228   BILITOT 0.3 06/16/2020 0834   BILITOT 0.2 (L) 03/27/2018 1324   BILITOT 0.34 01/24/2017 1228   GFRNONAA 33 (L) 06/16/2020 0834   GFRNONAA 26 (L) 03/27/2018 1324   GFRNONAA 78 12/16/2014 1530   GFRAA 38 (L) 06/16/2020 0834   GFRAA 30 (L) 03/27/2018 1324   GFRAA >89 12/16/2014 1530    No results found for: SPEP, UPEP  Lab Results  Component Value Date   WBC 6.5 09/30/2020   NEUTROABS 4.9 09/30/2020   HGB 12.0 09/30/2020   HCT 37.0 09/30/2020   MCV 95.6 09/30/2020   PLT 195 09/30/2020      Chemistry      Component Value Date/Time   NA 144 06/16/2020 0834   NA 141 01/24/2017 1228   K 4.0 06/16/2020 0834   K 4.6 01/24/2017 1228   CL 104 06/16/2020 0834   CO2 22 06/16/2020 0834   CO2 29 01/24/2017 1228   BUN 32 (H) 06/16/2020 0834   BUN 22.2 01/24/2017 1228   CREATININE 1.64  (H) 06/16/2020 0834   CREATININE 1.91 (H) 01/18/2020 1440   CREATININE 0.8 01/24/2017 1228   GLU 131 11/29/2018 0000      Component Value Date/Time   CALCIUM 9.4 06/16/2020 0834   CALCIUM 10.2 01/24/2017 1228   ALKPHOS 175 (H) 06/16/2020 0834   ALKPHOS 90 01/24/2017 1228   AST 25 06/16/2020 0834  AST 14 (L) 03/27/2018 1324   AST 16 01/24/2017 1228   ALT 33 (H) 06/16/2020 0834   ALT 18 03/27/2018 1324   ALT 14 01/24/2017 1228   BILITOT 0.3 06/16/2020 0834   BILITOT 0.2 (L) 03/27/2018 1324   BILITOT 0.34 01/24/2017 1228

## 2020-09-30 NOTE — Assessment & Plan Note (Signed)
She is doing well with Granix injection every 6 days ?We will continue indefinitely ?Chronic treatment with Granix has reduce the risk of hospitalization and sepsis ?

## 2020-09-30 NOTE — Assessment & Plan Note (Signed)
We discussed the risk and benefits of discontinuation of anastrozole after 5 years She is in agreement to stop in July of this year

## 2020-10-06 DIAGNOSIS — E559 Vitamin D deficiency, unspecified: Secondary | ICD-10-CM | POA: Diagnosis not present

## 2020-10-06 DIAGNOSIS — E782 Mixed hyperlipidemia: Secondary | ICD-10-CM | POA: Diagnosis not present

## 2020-10-06 DIAGNOSIS — E119 Type 2 diabetes mellitus without complications: Secondary | ICD-10-CM | POA: Diagnosis not present

## 2020-10-06 DIAGNOSIS — E039 Hypothyroidism, unspecified: Secondary | ICD-10-CM | POA: Diagnosis not present

## 2020-10-06 DIAGNOSIS — N184 Chronic kidney disease, stage 4 (severe): Secondary | ICD-10-CM | POA: Diagnosis not present

## 2020-10-06 DIAGNOSIS — I10 Essential (primary) hypertension: Secondary | ICD-10-CM | POA: Diagnosis not present

## 2020-10-08 DIAGNOSIS — N1832 Chronic kidney disease, stage 3b: Secondary | ICD-10-CM | POA: Diagnosis not present

## 2020-10-08 DIAGNOSIS — E119 Type 2 diabetes mellitus without complications: Secondary | ICD-10-CM | POA: Diagnosis not present

## 2020-10-08 DIAGNOSIS — E872 Acidosis: Secondary | ICD-10-CM | POA: Diagnosis not present

## 2020-10-08 DIAGNOSIS — I10 Essential (primary) hypertension: Secondary | ICD-10-CM | POA: Diagnosis not present

## 2020-10-09 DIAGNOSIS — N184 Chronic kidney disease, stage 4 (severe): Secondary | ICD-10-CM | POA: Diagnosis not present

## 2020-10-09 DIAGNOSIS — I129 Hypertensive chronic kidney disease with stage 1 through stage 4 chronic kidney disease, or unspecified chronic kidney disease: Secondary | ICD-10-CM | POA: Diagnosis not present

## 2020-10-09 DIAGNOSIS — Z79899 Other long term (current) drug therapy: Secondary | ICD-10-CM | POA: Diagnosis not present

## 2020-10-09 DIAGNOSIS — E1122 Type 2 diabetes mellitus with diabetic chronic kidney disease: Secondary | ICD-10-CM | POA: Diagnosis not present

## 2020-10-17 ENCOUNTER — Other Ambulatory Visit (INDEPENDENT_AMBULATORY_CARE_PROVIDER_SITE_OTHER): Payer: Self-pay | Admitting: Family Medicine

## 2020-10-17 DIAGNOSIS — F3289 Other specified depressive episodes: Secondary | ICD-10-CM

## 2020-10-20 ENCOUNTER — Telehealth (INDEPENDENT_AMBULATORY_CARE_PROVIDER_SITE_OTHER): Payer: Self-pay | Admitting: Family Medicine

## 2020-10-20 ENCOUNTER — Other Ambulatory Visit (INDEPENDENT_AMBULATORY_CARE_PROVIDER_SITE_OTHER): Payer: Self-pay | Admitting: Family Medicine

## 2020-10-20 ENCOUNTER — Ambulatory Visit (INDEPENDENT_AMBULATORY_CARE_PROVIDER_SITE_OTHER): Payer: BC Managed Care – PPO | Admitting: Family Medicine

## 2020-10-20 DIAGNOSIS — F3289 Other specified depressive episodes: Secondary | ICD-10-CM

## 2020-10-20 MED ORDER — BUPROPION HCL ER (SR) 150 MG PO TB12: 150.0000 mg | ORAL_TABLET | Freq: Every day | ORAL | 0 refills | Status: DC

## 2020-10-20 NOTE — Telephone Encounter (Signed)
Refill for 1 month, no refills.

## 2020-10-20 NOTE — Telephone Encounter (Signed)
Patient is calling to check status of refill request for bupropion. She did not wish to leave a Mychart message. Last seen by Dr. Leafy Ro.

## 2020-10-20 NOTE — Telephone Encounter (Signed)
Please advise in Dr. Beasley's absence.  

## 2020-10-20 NOTE — Telephone Encounter (Signed)
This request has been routed for approval.

## 2020-10-21 ENCOUNTER — Ambulatory Visit (INDEPENDENT_AMBULATORY_CARE_PROVIDER_SITE_OTHER): Payer: BC Managed Care – PPO | Admitting: Family Medicine

## 2020-10-21 ENCOUNTER — Encounter (INDEPENDENT_AMBULATORY_CARE_PROVIDER_SITE_OTHER): Payer: Self-pay | Admitting: Family Medicine

## 2020-10-21 ENCOUNTER — Other Ambulatory Visit: Payer: Self-pay

## 2020-10-21 VITALS — BP 113/79 | HR 85 | Temp 97.9°F | Ht 62.0 in | Wt 207.0 lb

## 2020-10-21 DIAGNOSIS — Z6838 Body mass index (BMI) 38.0-38.9, adult: Secondary | ICD-10-CM

## 2020-10-21 DIAGNOSIS — E1169 Type 2 diabetes mellitus with other specified complication: Secondary | ICD-10-CM | POA: Diagnosis not present

## 2020-10-21 DIAGNOSIS — E66812 Obesity, class 2: Secondary | ICD-10-CM

## 2020-10-21 DIAGNOSIS — F3289 Other specified depressive episodes: Secondary | ICD-10-CM

## 2020-10-21 MED ORDER — BUPROPION HCL ER (SR) 200 MG PO TB12: 200.0000 mg | ORAL_TABLET | Freq: Every day | ORAL | 0 refills | Status: DC

## 2020-10-21 MED ORDER — BUPROPION HCL ER (SR) 200 MG PO TB12: 200.0000 mg | ORAL_TABLET | Freq: Two times a day (BID) | ORAL | 0 refills | Status: DC

## 2020-10-23 ENCOUNTER — Encounter (INDEPENDENT_AMBULATORY_CARE_PROVIDER_SITE_OTHER): Payer: Self-pay | Admitting: Family Medicine

## 2020-10-23 NOTE — Progress Notes (Signed)
Chief Complaint:   OBESITY [name] is here to discuss her progress with her obesity treatment plan along with follow-up of her obesity related diagnoses. [name] is on the Category 2 Plan or keeping a food journal and adhering to recommended goals of 1200-1300 calories and 75+ grams of protein daily and states she is following her eating plan approximately 75% of the time. [name] states she is doing 0 minutes 0 times per week.  Today's visit was #: 42 Starting weight: 208 lbs Starting date: 04/05/2018 Today's weight: 207 lbs Today's date: 10/21/2020 Total lbs lost to date: 1 Total lbs lost since last in-office visit: 2  Interim History: [name] had a very large surgery in 2020 for pelvic cancer, and she struggles with the ability to exercise much because of this. She is mostly following Category 2 rather than journaling. She says her husband is now back at work which will help her adhere to the plan better. He tends to want to eat 3 cooked meals per day.  Subjective:   1. Other depression, with emotional eating [name] is on bupropion 150 mg. She feels it is helping, but she notes cravings in the evening. She feels she needs a higher dose.  2. Type 2 diabetes mellitus with other specified complication, without long-term current use of insulin ([place]) [name] is on Rybelsus- last A1c was 6.5 (10/06/2020 at Endocrinologist-Dr. Altheimer). She is on Crestor 10 mg (LDL at goal at 22).  Lab Results  Component Value Date   HGBA1C 6.3 (H) 06/16/2020   HGBA1C 7.9 (H) 09/01/2017   HGBA1C 5.3 04/01/2017   Lab Results  Component Value Date   MICROALBUR 5.6 (H) 08/30/2016   LDLCALC 21 06/16/2020   CREATININE 1.64 (H) 06/16/2020   Lab Results  Component Value Date   INSULIN 14.1 06/16/2020   Assessment/Plan:   1. Other depression, with emotional eating  [name] agreed to increase bupropion to 200 mg q daily with no refills. Orders and follow up as documented in patient record.   - buPROPion  (WELLBUTRIN SR) 200 MG 12 hr tablet; Take 1 tablet (200 mg total) by mouth daily.  Dispense: 30 tablet; Refill: 0  2. Type 2 diabetes mellitus with other specified complication, without long-term current use of insulin ([place])  [name] will continue Rybelsus 14 mg daily (no refill needed).  3. Class 2 severe obesity: BMI 39 [name] is currently in the action stage of change. As such, her goal is to continue with weight loss efforts. She has agreed to the Category 2 Plan or keeping a food journal and adhering to recommended goals of 1200-1300 calories and 75 grams of protein daily.   Exercise goals: No exercise has been prescribed at this time.  Behavioral modification strategies: increasing lean protein intake and decreasing simple carbohydrates.  [name] has agreed to follow-up with our clinic in 4 weeks with Dr. Leafy Ro per patient's request.  Objective:   Blood pressure 113/79, pulse 85, temperature 97.9 F (36.6 C), height 5\' 2"  (1.575 m), weight 207 lb (93.9 kg), SpO2 96 %. Body mass index is 37.86 kg/m.  General: Cooperative, alert, well developed, in no acute distress. HEENT: Conjunctivae and lids unremarkable. Cardiovascular: Regular rhythm.  Lungs: Normal work of breathing. Neurologic: No focal deficits.   Lab Results  Component Value Date   CREATININE 1.64 (H) 06/16/2020   BUN 32 (H) 06/16/2020   NA 144 06/16/2020   K 4.0 06/16/2020   CL 104 06/16/2020   CO2 22 06/16/2020  Lab Results  Component Value Date   ALT 33 (H) 06/16/2020   AST 25 06/16/2020   ALKPHOS 175 (H) 06/16/2020   BILITOT 0.3 06/16/2020   Lab Results  Component Value Date   HGBA1C 6.3 (H) 06/16/2020   HGBA1C 7.9 (H) 09/01/2017   HGBA1C 5.3 04/01/2017   HGBA1C 5.8 (H) 09/05/2015   HGBA1C 6.8 (H) 11/21/2014   Lab Results  Component Value Date   INSULIN 14.1 06/16/2020   Lab Results  Component Value Date   TSH 3.38 01/18/2020   Lab Results  Component Value Date   CHOL 92 (L) 06/16/2020    HDL 49 06/16/2020   LDLCALC 21 06/16/2020   TRIG 128 06/16/2020   CHOLHDL 1.9 01/18/2020   Lab Results  Component Value Date   WBC 6.5 09/30/2020   HGB 12.0 09/30/2020   HCT 37.0 09/30/2020   MCV 95.6 09/30/2020   PLT 195 09/30/2020   Lab Results  Component Value Date   IRON 7 (L) 08/31/2017   TIBC 164 (L) 08/31/2017   FERRITIN 27 11/29/2014   Attestation Statements:   Reviewed by clinician on day of visit: allergies, medications, problem list, medical history, surgical history, family history, social history, and previous encounter notes.   Wilhemena Durie, am acting as Location manager for [employer], FNP-C.  I have reviewed the above documentation for accuracy and completeness, and I agree with the above. -  Georgianne Fick, FNP

## 2020-10-27 DIAGNOSIS — Z933 Colostomy status: Secondary | ICD-10-CM | POA: Diagnosis not present

## 2020-10-29 DIAGNOSIS — Z933 Colostomy status: Secondary | ICD-10-CM | POA: Diagnosis not present

## 2020-10-29 DIAGNOSIS — Z1211 Encounter for screening for malignant neoplasm of colon: Secondary | ICD-10-CM | POA: Diagnosis not present

## 2020-10-31 ENCOUNTER — Other Ambulatory Visit (INDEPENDENT_AMBULATORY_CARE_PROVIDER_SITE_OTHER): Payer: Self-pay | Admitting: Family Medicine

## 2020-10-31 DIAGNOSIS — E1169 Type 2 diabetes mellitus with other specified complication: Secondary | ICD-10-CM

## 2020-11-04 ENCOUNTER — Other Ambulatory Visit (INDEPENDENT_AMBULATORY_CARE_PROVIDER_SITE_OTHER): Payer: Self-pay | Admitting: Adult Health

## 2020-11-04 DIAGNOSIS — E1169 Type 2 diabetes mellitus with other specified complication: Secondary | ICD-10-CM

## 2020-11-04 MED ORDER — RYBELSUS 14 MG PO TABS
1.0000 | ORAL_TABLET | Freq: Every day | ORAL | 0 refills | Status: DC
Start: 2020-11-04 — End: 2020-11-13

## 2020-11-04 NOTE — Telephone Encounter (Signed)
Please advise in Dr. Beasley's absence.  

## 2020-11-11 ENCOUNTER — Other Ambulatory Visit (INDEPENDENT_AMBULATORY_CARE_PROVIDER_SITE_OTHER): Payer: Self-pay | Admitting: Family Medicine

## 2020-11-11 DIAGNOSIS — F3289 Other specified depressive episodes: Secondary | ICD-10-CM

## 2020-11-12 ENCOUNTER — Other Ambulatory Visit (INDEPENDENT_AMBULATORY_CARE_PROVIDER_SITE_OTHER): Payer: Self-pay | Admitting: Adult Health

## 2020-11-12 ENCOUNTER — Encounter (INDEPENDENT_AMBULATORY_CARE_PROVIDER_SITE_OTHER): Payer: Self-pay | Admitting: Family Medicine

## 2020-11-12 ENCOUNTER — Other Ambulatory Visit (INDEPENDENT_AMBULATORY_CARE_PROVIDER_SITE_OTHER): Payer: Self-pay | Admitting: Family Medicine

## 2020-11-12 DIAGNOSIS — F3289 Other specified depressive episodes: Secondary | ICD-10-CM

## 2020-11-12 DIAGNOSIS — E1169 Type 2 diabetes mellitus with other specified complication: Secondary | ICD-10-CM

## 2020-11-13 ENCOUNTER — Other Ambulatory Visit (INDEPENDENT_AMBULATORY_CARE_PROVIDER_SITE_OTHER): Payer: Self-pay

## 2020-11-13 DIAGNOSIS — E1169 Type 2 diabetes mellitus with other specified complication: Secondary | ICD-10-CM

## 2020-11-13 MED ORDER — RYBELSUS 14 MG PO TABS: 1.0000 | ORAL_TABLET | Freq: Every day | ORAL | 0 refills | Status: DC

## 2020-11-17 DIAGNOSIS — I480 Paroxysmal atrial fibrillation: Secondary | ICD-10-CM | POA: Diagnosis not present

## 2020-11-17 DIAGNOSIS — C541 Malignant neoplasm of endometrium: Secondary | ICD-10-CM | POA: Diagnosis not present

## 2020-11-17 DIAGNOSIS — K219 Gastro-esophageal reflux disease without esophagitis: Secondary | ICD-10-CM | POA: Diagnosis not present

## 2020-11-17 DIAGNOSIS — N184 Chronic kidney disease, stage 4 (severe): Secondary | ICD-10-CM | POA: Diagnosis not present

## 2020-11-17 DIAGNOSIS — D709 Neutropenia, unspecified: Secondary | ICD-10-CM | POA: Diagnosis not present

## 2020-11-17 DIAGNOSIS — Z01818 Encounter for other preprocedural examination: Secondary | ICD-10-CM | POA: Diagnosis not present

## 2020-11-17 DIAGNOSIS — E119 Type 2 diabetes mellitus without complications: Secondary | ICD-10-CM | POA: Diagnosis not present

## 2020-11-17 DIAGNOSIS — I10 Essential (primary) hypertension: Secondary | ICD-10-CM | POA: Diagnosis not present

## 2020-11-17 DIAGNOSIS — E039 Hypothyroidism, unspecified: Secondary | ICD-10-CM | POA: Diagnosis not present

## 2020-11-17 DIAGNOSIS — Z853 Personal history of malignant neoplasm of breast: Secondary | ICD-10-CM | POA: Diagnosis not present

## 2020-11-17 DIAGNOSIS — C569 Malignant neoplasm of unspecified ovary: Secondary | ICD-10-CM | POA: Diagnosis not present

## 2020-11-18 ENCOUNTER — Encounter (INDEPENDENT_AMBULATORY_CARE_PROVIDER_SITE_OTHER): Payer: Self-pay | Admitting: Family Medicine

## 2020-11-18 ENCOUNTER — Other Ambulatory Visit: Payer: Self-pay

## 2020-11-18 ENCOUNTER — Encounter (INDEPENDENT_AMBULATORY_CARE_PROVIDER_SITE_OTHER): Payer: Self-pay

## 2020-11-18 ENCOUNTER — Ambulatory Visit (INDEPENDENT_AMBULATORY_CARE_PROVIDER_SITE_OTHER): Payer: BC Managed Care – PPO | Admitting: Family Medicine

## 2020-11-18 VITALS — BP 113/74 | HR 75 | Temp 97.6°F | Ht 62.0 in | Wt 210.0 lb

## 2020-11-18 DIAGNOSIS — Z6838 Body mass index (BMI) 38.0-38.9, adult: Secondary | ICD-10-CM

## 2020-11-18 DIAGNOSIS — F3289 Other specified depressive episodes: Secondary | ICD-10-CM | POA: Diagnosis not present

## 2020-11-18 MED ORDER — BUPROPION HCL ER (SR) 200 MG PO TB12: 200.0000 mg | ORAL_TABLET | Freq: Every day | ORAL | 0 refills | Status: DC

## 2020-11-19 DIAGNOSIS — N189 Chronic kidney disease, unspecified: Secondary | ICD-10-CM | POA: Diagnosis not present

## 2020-11-19 DIAGNOSIS — Z8542 Personal history of malignant neoplasm of other parts of uterus: Secondary | ICD-10-CM | POA: Diagnosis not present

## 2020-11-19 DIAGNOSIS — Z86010 Personal history of colonic polyps: Secondary | ICD-10-CM | POA: Diagnosis not present

## 2020-11-19 DIAGNOSIS — Z933 Colostomy status: Secondary | ICD-10-CM | POA: Diagnosis not present

## 2020-11-19 DIAGNOSIS — E1122 Type 2 diabetes mellitus with diabetic chronic kidney disease: Secondary | ICD-10-CM | POA: Diagnosis not present

## 2020-11-19 DIAGNOSIS — Z980 Intestinal bypass and anastomosis status: Secondary | ICD-10-CM | POA: Diagnosis not present

## 2020-11-19 DIAGNOSIS — Z7984 Long term (current) use of oral hypoglycemic drugs: Secondary | ICD-10-CM | POA: Diagnosis not present

## 2020-11-19 DIAGNOSIS — K632 Fistula of intestine: Secondary | ICD-10-CM | POA: Diagnosis not present

## 2020-11-19 DIAGNOSIS — Z8543 Personal history of malignant neoplasm of ovary: Secondary | ICD-10-CM | POA: Diagnosis not present

## 2020-11-19 DIAGNOSIS — Z7982 Long term (current) use of aspirin: Secondary | ICD-10-CM | POA: Diagnosis not present

## 2020-11-19 DIAGNOSIS — K604 Rectal fistula: Secondary | ICD-10-CM | POA: Diagnosis not present

## 2020-11-19 DIAGNOSIS — Z79899 Other long term (current) drug therapy: Secondary | ICD-10-CM | POA: Diagnosis not present

## 2020-11-19 DIAGNOSIS — Z1211 Encounter for screening for malignant neoplasm of colon: Secondary | ICD-10-CM | POA: Diagnosis not present

## 2020-11-19 LAB — HM COLONOSCOPY

## 2020-11-24 ENCOUNTER — Encounter (INDEPENDENT_AMBULATORY_CARE_PROVIDER_SITE_OTHER): Payer: Self-pay | Admitting: Family Medicine

## 2020-11-24 NOTE — Telephone Encounter (Signed)
Please review

## 2020-11-24 NOTE — Progress Notes (Signed)
Chief Complaint:   OBESITY [name] is here to discuss her progress with her obesity treatment plan along with follow-up of her obesity related diagnoses. [name] is on the Category 2 Plan or keeping a food journal and adhering to recommended goals of 1200-1300 calories and 75 grams of protein daily and states she is following her eating plan approximately (unknown) % of the time. [name] states she is walking for 15-20 minutes 2-3 times per week.  Today's visit was #: 60 Starting weight: 208 lbs Starting date: 04/05/2018 Today's weight: 210 lbs Today's date: 11/18/2020 Total lbs lost to date: 0 Total lbs lost since last in-office visit: 0  Interim History: [name] has been dealing with a lot of health issues and she hasn't been able to concentrate on her weight loss. She is still mindful of her food choices. Her protein intake appears to be decreased.  Subjective:   1. Other depression, with emotional eating [name] is frustrated with her continued health problems. She has done more comfort eating recently, but she is working on decreasing this.  Assessment/Plan:   1. Other depression, with emotional eating Emotional eating behaviors were discussed today with [name] today. We will refill Wellbutrin for 90 days with no refills. Orders and follow up as documented in patient record.   - buPROPion (WELLBUTRIN SR) 200 MG 12 hr tablet; Take 1 tablet (200 mg total) by mouth daily.  Dispense: 90 tablet; Refill: 0  2. Obesity with current BMI of 38.5 [name] is currently in the action stage of change. As such, her goal is to maintain weight for now. She has agreed to keeping a food journal and adhering to recommended goals of 1200-1300 calories and 75+ grams of protein daily.   [name]'s goal is to avoid weight gain for now, and go back to journaling when she is able to get back on track.  Exercise goals: As is.  Behavioral modification strategies: increasing lean protein intake and emotional eating  strategies.  [name] has agreed to follow-up with our clinic in 4 weeks. She was informed of the importance of frequent follow-up visits to maximize her success with intensive lifestyle modifications for her multiple health conditions.   Objective:   Blood pressure 113/74, pulse 75, temperature 97.6 F (36.4 C), height 5\' 2"  (1.575 m), weight 210 lb (95.3 kg), SpO2 96 %. Body mass index is 38.41 kg/m.  General: Cooperative, alert, well developed, in no acute distress. HEENT: Conjunctivae and lids unremarkable. Cardiovascular: Regular rhythm.  Lungs: Normal work of breathing. Neurologic: No focal deficits.   Lab Results  Component Value Date   CREATININE 1.64 (H) 06/16/2020   BUN 32 (H) 06/16/2020   NA 144 06/16/2020   K 4.0 06/16/2020   CL 104 06/16/2020   CO2 22 06/16/2020   Lab Results  Component Value Date   ALT 33 (H) 06/16/2020   AST 25 06/16/2020   ALKPHOS 175 (H) 06/16/2020   BILITOT 0.3 06/16/2020   Lab Results  Component Value Date   HGBA1C 6.3 (H) 06/16/2020   HGBA1C 7.9 (H) 09/01/2017   HGBA1C 5.3 04/01/2017   HGBA1C 5.8 (H) 09/05/2015   HGBA1C 6.8 (H) 11/21/2014   Lab Results  Component Value Date   INSULIN 14.1 06/16/2020   Lab Results  Component Value Date   TSH 3.38 01/18/2020   Lab Results  Component Value Date   CHOL 92 (L) 06/16/2020   HDL 49 06/16/2020   LDLCALC 21 06/16/2020   TRIG 128 06/16/2020  CHOLHDL 1.9 01/18/2020   Lab Results  Component Value Date   WBC 6.5 09/30/2020   HGB 12.0 09/30/2020   HCT 37.0 09/30/2020   MCV 95.6 09/30/2020   PLT 195 09/30/2020   Lab Results  Component Value Date   IRON 7 (L) 08/31/2017   TIBC 164 (L) 08/31/2017   FERRITIN 27 11/29/2014   Attestation Statements:   Reviewed by clinician on day of visit: allergies, medications, problem list, medical history, surgical history, family history, social history, and previous encounter notes.  Time spent on visit including pre-visit chart review  and post-visit care and charting was 34 minutes.    I, Trixie Dredge, am acting as transcriptionist for Dennard Nip, MD.  I have reviewed the above documentation for accuracy and completeness, and I agree with the above. -  Dennard Nip, MD

## 2020-11-24 NOTE — Telephone Encounter (Signed)
Dr.Beasley 

## 2020-11-26 ENCOUNTER — Encounter (INDEPENDENT_AMBULATORY_CARE_PROVIDER_SITE_OTHER): Payer: Self-pay | Admitting: Family Medicine

## 2020-12-15 ENCOUNTER — Ambulatory Visit (INDEPENDENT_AMBULATORY_CARE_PROVIDER_SITE_OTHER): Payer: BC Managed Care – PPO | Admitting: Family Medicine

## 2020-12-15 ENCOUNTER — Encounter (INDEPENDENT_AMBULATORY_CARE_PROVIDER_SITE_OTHER): Payer: Self-pay | Admitting: Family Medicine

## 2020-12-15 ENCOUNTER — Other Ambulatory Visit: Payer: Self-pay

## 2020-12-15 VITALS — BP 132/79 | HR 71 | Temp 97.9°F | Ht 62.0 in | Wt 210.0 lb

## 2020-12-15 DIAGNOSIS — Z9189 Other specified personal risk factors, not elsewhere classified: Secondary | ICD-10-CM | POA: Diagnosis not present

## 2020-12-15 DIAGNOSIS — F3289 Other specified depressive episodes: Secondary | ICD-10-CM

## 2020-12-15 DIAGNOSIS — E1169 Type 2 diabetes mellitus with other specified complication: Secondary | ICD-10-CM | POA: Diagnosis not present

## 2020-12-15 DIAGNOSIS — Z6838 Body mass index (BMI) 38.0-38.9, adult: Secondary | ICD-10-CM

## 2020-12-15 MED ORDER — BUPROPION HCL ER (SR) 200 MG PO TB12
200.0000 mg | ORAL_TABLET | Freq: Every day | ORAL | 0 refills | Status: DC
Start: 2020-12-15 — End: 2021-01-12

## 2020-12-15 MED ORDER — RYBELSUS 14 MG PO TABS: 1.0000 | ORAL_TABLET | Freq: Every day | ORAL | 0 refills | Status: DC

## 2020-12-16 NOTE — Progress Notes (Signed)
Chief Complaint:   OBESITY [name] is here to discuss her progress with her obesity treatment plan along with follow-up of her obesity related diagnoses. [name] is on keeping a food journal and adhering to recommended goals of 1200-1300 calories and 75+ grams of protein daily and states she is following her eating plan approximately 75% of the time. [name] states she is doing 0 minutes 0 times per week.  Today's visit was #: 64 Starting weight: 208 lbs Starting date: 04/05/2018 Today's weight: 210 lbs Today's date: 12/15/2020 Total lbs lost to date: 0 Total lbs lost since last in-office visit: 0  Interim History: [name] continues to do well with maintaining her weight, but she has struggled to follow her plan closely enough to lose weight. She is frustrated with this but she doesn't want to stop trying to lose weight.  Subjective:   1. Type 2 diabetes mellitus with other specified complication, without long-term current use of insulin ([facility]) [name] is tolerating Rybelsus and she is working on diet and weight loss. She requests a 90 day refill.  2. Other depression, with emotional eating [name] is stable on Wellbutrin. She is frustrated with her weight loss, but she is still motivated to do better.  3. At risk for impaired metabolic function [name] is at increased risk for impaired metabolic function if protein decreases.  Assessment/Plan:   1. Type 2 diabetes mellitus with other specified complication, without long-term current use of insulin ([facility]) We will refill Rybelsus for 90 days with no refills. Delayza will continue to follow up as directed. Good blood sugar control is important to decrease the likelihood of diabetic complications such as nephropathy, neuropathy, limb loss, blindness, coronary artery disease, and death. Intensive lifestyle modification including diet, exercise and weight loss are the first line of treatment for diabetes.   - Semaglutide (RYBELSUS) 14 MG TABS; Take 1  tablet by mouth daily.  Dispense: 90 tablet; Refill: 0  2. Other depression, with emotional eating Behavior modification techniques were discussed today to help Shuronda deal with her emotional/non-hunger eating behaviors. We will refill Wellbutrin SR for 90 days with no refills. Orders and follow up as documented in patient record.   - buPROPion (WELLBUTRIN SR) 200 MG 12 hr tablet; Take 1 tablet (200 mg total) by mouth daily.  Dispense: 90 tablet; Refill: 0  3. At risk for impaired metabolic function [name] was given approximately 15 minutes of impaired  metabolic function prevention counseling today. We discussed intensive lifestyle modifications today with an emphasis on specific nutrition and exercise instructions and strategies.   Repetitive spaced learning was employed today to elicit superior memory formation and behavioral change.  4. Obesity with current BMI 38.4 [name] is currently in the action stage of change. As such, her goal is to continue with weight loss efforts. She has agreed to keeping a food journal and adhering to recommended goals of 1200-1300 calories and 75+ grams of protein daily.   Behavioral modification strategies: increasing lean protein intake and decreasing eating out.  [name] has agreed to follow-up with our clinic in 4 weeks. She was informed of the importance of frequent follow-up visits to maximize her success with intensive lifestyle modifications for her multiple health conditions.   Objective:   Blood pressure 132/79, pulse 71, temperature 97.9 F (36.6 C), height 5\' 2"  (1.575 m), weight 210 lb (95.3 kg), SpO2 97 %. Body mass index is 38.41 kg/m.  General: Cooperative, alert, well developed, in no acute distress. HEENT: Conjunctivae  and lids unremarkable. Cardiovascular: Regular rhythm.  Lungs: Normal work of breathing. Neurologic: No focal deficits.   Lab Results  Component Value Date   CREATININE 1.64 (H) 06/16/2020   BUN 32 (H) 06/16/2020   NA  144 06/16/2020   K 4.0 06/16/2020   CL 104 06/16/2020   CO2 22 06/16/2020   Lab Results  Component Value Date   ALT 33 (H) 06/16/2020   AST 25 06/16/2020   ALKPHOS 175 (H) 06/16/2020   BILITOT 0.3 06/16/2020   Lab Results  Component Value Date   HGBA1C 6.3 (H) 06/16/2020   HGBA1C 7.9 (H) 09/01/2017   HGBA1C 5.3 04/01/2017   HGBA1C 5.8 (H) 09/05/2015   HGBA1C 6.8 (H) 11/21/2014   Lab Results  Component Value Date   INSULIN 14.1 06/16/2020   Lab Results  Component Value Date   TSH 3.38 01/18/2020   Lab Results  Component Value Date   CHOL 92 (L) 06/16/2020   HDL 49 06/16/2020   LDLCALC 21 06/16/2020   TRIG 128 06/16/2020   CHOLHDL 1.9 01/18/2020   Lab Results  Component Value Date   WBC 6.5 09/30/2020   HGB 12.0 09/30/2020   HCT 37.0 09/30/2020   MCV 95.6 09/30/2020   PLT 195 09/30/2020   Lab Results  Component Value Date   IRON 7 (L) 08/31/2017   TIBC 164 (L) 08/31/2017   FERRITIN 27 11/29/2014   Attestation Statements:   Reviewed by clinician on day of visit: allergies, medications, problem list, medical history, surgical history, family history, social history, and previous encounter notes.   I, Trixie Dredge, am acting as transcriptionist for Dennard Nip, MD.  I have reviewed the above documentation for accuracy and completeness, and I agree with the above. -  Dennard Nip, MD

## 2020-12-18 DIAGNOSIS — Z4801 Encounter for change or removal of surgical wound dressing: Secondary | ICD-10-CM | POA: Diagnosis not present

## 2020-12-18 DIAGNOSIS — L89319 Pressure ulcer of right buttock, unspecified stage: Secondary | ICD-10-CM | POA: Diagnosis not present

## 2020-12-18 DIAGNOSIS — Z933 Colostomy status: Secondary | ICD-10-CM | POA: Diagnosis not present

## 2020-12-18 DIAGNOSIS — Z936 Other artificial openings of urinary tract status: Secondary | ICD-10-CM | POA: Diagnosis not present

## 2020-12-26 DIAGNOSIS — Z4801 Encounter for change or removal of surgical wound dressing: Secondary | ICD-10-CM | POA: Diagnosis not present

## 2020-12-26 DIAGNOSIS — Z933 Colostomy status: Secondary | ICD-10-CM | POA: Diagnosis not present

## 2020-12-26 DIAGNOSIS — Z936 Other artificial openings of urinary tract status: Secondary | ICD-10-CM | POA: Diagnosis not present

## 2020-12-26 DIAGNOSIS — L89319 Pressure ulcer of right buttock, unspecified stage: Secondary | ICD-10-CM | POA: Diagnosis not present

## 2020-12-31 ENCOUNTER — Other Ambulatory Visit: Payer: Self-pay | Admitting: Hematology and Oncology

## 2021-01-07 ENCOUNTER — Other Ambulatory Visit: Payer: Self-pay | Admitting: *Deleted

## 2021-01-07 ENCOUNTER — Telehealth: Payer: Self-pay | Admitting: *Deleted

## 2021-01-07 MED ORDER — TBO-FILGRASTIM 480 MCG/0.8ML ~~LOC~~ SOSY: PREFILLED_SYRINGE | SUBCUTANEOUS | 11 refills | Status: DC

## 2021-01-07 NOTE — Telephone Encounter (Signed)
Received call from pt requesting refill on Granix to be sent to [facility].  Per MD okay to send refill.

## 2021-01-08 ENCOUNTER — Other Ambulatory Visit: Payer: Self-pay

## 2021-01-08 ENCOUNTER — Telehealth: Payer: Self-pay

## 2021-01-08 MED ORDER — TBO-FILGRASTIM 480 MCG/0.8ML ~~LOC~~ SOSY: PREFILLED_SYRINGE | SUBCUTANEOUS | 11 refills | Status: DC

## 2021-01-08 NOTE — Telephone Encounter (Signed)
Called and told Rx for Gran[facility]. She verbalized understanding.

## 2021-01-08 NOTE — Telephone Encounter (Signed)
[facility]. Granix is ready to be shipped.

## 2021-01-12 ENCOUNTER — Ambulatory Visit (INDEPENDENT_AMBULATORY_CARE_PROVIDER_SITE_OTHER): Payer: BC Managed Care – PPO | Admitting: Family Medicine

## 2021-01-12 ENCOUNTER — Other Ambulatory Visit: Payer: Self-pay

## 2021-01-12 ENCOUNTER — Encounter (INDEPENDENT_AMBULATORY_CARE_PROVIDER_SITE_OTHER): Payer: Self-pay | Admitting: Family Medicine

## 2021-01-12 VITALS — BP 119/69 | HR 68 | Temp 98.1°F | Ht 62.0 in | Wt 207.0 lb

## 2021-01-12 DIAGNOSIS — F3289 Other specified depressive episodes: Secondary | ICD-10-CM | POA: Diagnosis not present

## 2021-01-12 DIAGNOSIS — Z6838 Body mass index (BMI) 38.0-38.9, adult: Secondary | ICD-10-CM | POA: Diagnosis not present

## 2021-01-12 MED ORDER — BUPROPION HCL ER (SR) 200 MG PO TB12: 200.0000 mg | ORAL_TABLET | Freq: Every day | ORAL | 0 refills | Status: DC

## 2021-01-19 ENCOUNTER — Other Ambulatory Visit: Payer: Self-pay

## 2021-01-19 DIAGNOSIS — E119 Type 2 diabetes mellitus without complications: Secondary | ICD-10-CM | POA: Diagnosis not present

## 2021-01-19 DIAGNOSIS — E039 Hypothyroidism, unspecified: Secondary | ICD-10-CM | POA: Diagnosis not present

## 2021-01-19 DIAGNOSIS — N184 Chronic kidney disease, stage 4 (severe): Secondary | ICD-10-CM | POA: Diagnosis not present

## 2021-01-19 DIAGNOSIS — E782 Mixed hyperlipidemia: Secondary | ICD-10-CM | POA: Diagnosis not present

## 2021-01-19 DIAGNOSIS — E559 Vitamin D deficiency, unspecified: Secondary | ICD-10-CM | POA: Diagnosis not present

## 2021-01-19 NOTE — Progress Notes (Signed)
Chief Complaint:   OBESITY Devonne is here to discuss her progress with her obesity treatment plan along with follow-up of her obesity related diagnoses. Deah is on keeping a food journal and adhering to recommended goals of 1200-1300 calories and 75+ grams of protein daily and states she is following her eating plan approximately (unknown)% of the time. Bona states she has increased activity (more yard work) 3-4 times per week.  Today's visit was #: 50 Starting weight: 208 lbs Starting date: 04/05/2018 Today's weight: 207 Today's date: 01/12/2021 Total lbs lost to date: 1 Total lbs lost since last in-office visit: 3  Interim History: Maisen did some celebration eating, but she was able to portion control. Her hunger is mostly controlled. She struggles to meet her protein goal.  Subjective:   1. Other depression, with emotional eating Camella is tolerating Wellbutrin, but she is frustrated with her health and this could be contributing to some emotional eating behaviors.   Assessment/Plan:   1. Other depression, with emotional eating Behavior modification techniques were discussed today to help Margeaux deal with her emotional/non-hunger eating behaviors. Victor will continue Wellbutrin as is, and will continue to monitor closely. We will refill Wellbutrin SR for 90 days with no refills. Orders and follow up as documented in patient record.   - buPROPion (WELLBUTRIN SR) 200 MG 12 hr tablet; Take 1 tablet (200 mg total) by mouth daily.  Dispense: 90 tablet; Refill: 0  2. Obesity with current BMI 37.9 Chace is currently in the action stage of change. As such, her goal is to continue with weight loss efforts. She has agreed to keeping a food journal and adhering to recommended goals of 1200-1300 calories and 75+ grams of protein daily.   Low cook lighter protein recipes were discussed in depth today.  Exercise goals: As is.  Behavioral modification strategies: increasing lean protein  intake.  Joelys has agreed to follow-up with our clinic in 3 to 4 weeks. She was informed of the importance of frequent follow-up visits to maximize her success with intensive lifestyle modifications for her multiple health conditions.   Objective:   Blood pressure 119/69, pulse 68, temperature 98.1 F (36.7 C), height 5\' 2"  (1.575 m), weight 207 lb (93.9 kg), SpO2 99 %. Body mass index is 37.86 kg/m.  General: Cooperative, alert, well developed, in no acute distress. HEENT: Conjunctivae and lids unremarkable. Cardiovascular: Regular rhythm.  Lungs: Normal work of breathing. Neurologic: No focal deficits.   Lab Results  Component Value Date   CREATININE 1.64 (H) 06/16/2020   BUN 32 (H) 06/16/2020   NA 144 06/16/2020   K 4.0 06/16/2020   CL 104 06/16/2020   CO2 22 06/16/2020   Lab Results  Component Value Date   ALT 33 (H) 06/16/2020   AST 25 06/16/2020   ALKPHOS 175 (H) 06/16/2020   BILITOT 0.3 06/16/2020   Lab Results  Component Value Date   HGBA1C 6.3 (H) 06/16/2020   HGBA1C 7.9 (H) 09/01/2017   HGBA1C 5.3 04/01/2017   HGBA1C 5.8 (H) 09/05/2015   HGBA1C 6.8 (H) 11/21/2014   Lab Results  Component Value Date   INSULIN 14.1 06/16/2020   Lab Results  Component Value Date   TSH 3.38 01/18/2020   Lab Results  Component Value Date   CHOL 92 (L) 06/16/2020   HDL 49 06/16/2020   LDLCALC 21 06/16/2020   TRIG 128 06/16/2020   CHOLHDL 1.9 01/18/2020   Lab Results  Component Value Date  WBC 6.5 09/30/2020   HGB 12.0 09/30/2020   HCT 37.0 09/30/2020   MCV 95.6 09/30/2020   PLT 195 09/30/2020   Lab Results  Component Value Date   IRON 7 (L) 08/31/2017   TIBC 164 (L) 08/31/2017   FERRITIN 27 11/29/2014   Attestation Statements:   Reviewed by clinician on day of visit: allergies, medications, problem list, medical history, surgical history, family history, social history, and previous encounter notes.  Time spent on visit including pre-visit chart review  and post-visit care and charting was 33 minutes.    I, Trixie Dredge, am acting as transcriptionist for Dennard Nip, MD.  I have reviewed the above documentation for accuracy and completeness, and I agree with the above. -  Dennard Nip, MD

## 2021-01-20 ENCOUNTER — Ambulatory Visit (INDEPENDENT_AMBULATORY_CARE_PROVIDER_SITE_OTHER): Payer: BC Managed Care – PPO | Admitting: Family Medicine

## 2021-01-20 ENCOUNTER — Other Ambulatory Visit: Payer: Self-pay | Admitting: Family Medicine

## 2021-01-20 VITALS — BP 120/70 | HR 69 | Temp 97.3°F | Resp 20 | Ht 62.0 in | Wt 209.4 lb

## 2021-01-20 DIAGNOSIS — N321 Vesicointestinal fistula: Secondary | ICD-10-CM

## 2021-01-20 DIAGNOSIS — E1122 Type 2 diabetes mellitus with diabetic chronic kidney disease: Secondary | ICD-10-CM

## 2021-01-20 DIAGNOSIS — Z0000 Encounter for general adult medical examination without abnormal findings: Secondary | ICD-10-CM | POA: Diagnosis not present

## 2021-01-20 DIAGNOSIS — E039 Hypothyroidism, unspecified: Secondary | ICD-10-CM

## 2021-01-20 DIAGNOSIS — C569 Malignant neoplasm of unspecified ovary: Secondary | ICD-10-CM | POA: Diagnosis not present

## 2021-01-20 DIAGNOSIS — E440 Moderate protein-calorie malnutrition: Secondary | ICD-10-CM

## 2021-01-20 DIAGNOSIS — E1151 Type 2 diabetes mellitus with diabetic peripheral angiopathy without gangrene: Secondary | ICD-10-CM

## 2021-01-20 DIAGNOSIS — R8290 Unspecified abnormal findings in urine: Secondary | ICD-10-CM

## 2021-01-20 DIAGNOSIS — Z933 Colostomy status: Secondary | ICD-10-CM

## 2021-01-20 DIAGNOSIS — G8929 Other chronic pain: Secondary | ICD-10-CM

## 2021-01-20 DIAGNOSIS — I480 Paroxysmal atrial fibrillation: Secondary | ICD-10-CM

## 2021-01-20 DIAGNOSIS — N184 Chronic kidney disease, stage 4 (severe): Secondary | ICD-10-CM

## 2021-01-20 DIAGNOSIS — I440 Atrioventricular block, first degree: Secondary | ICD-10-CM

## 2021-01-20 DIAGNOSIS — F321 Major depressive disorder, single episode, moderate: Secondary | ICD-10-CM

## 2021-01-20 DIAGNOSIS — D473 Essential (hemorrhagic) thrombocythemia: Secondary | ICD-10-CM | POA: Diagnosis not present

## 2021-01-20 DIAGNOSIS — Z23 Encounter for immunization: Secondary | ICD-10-CM

## 2021-01-20 DIAGNOSIS — N3000 Acute cystitis without hematuria: Secondary | ICD-10-CM

## 2021-01-20 DIAGNOSIS — E559 Vitamin D deficiency, unspecified: Secondary | ICD-10-CM

## 2021-01-20 DIAGNOSIS — N183 Chronic kidney disease, stage 3 unspecified: Secondary | ICD-10-CM

## 2021-01-20 DIAGNOSIS — E785 Hyperlipidemia, unspecified: Secondary | ICD-10-CM

## 2021-01-20 LAB — POC URINALSYSI DIPSTICK (AUTOMATED)
Bilirubin, UA: NEGATIVE
Glucose, UA: NEGATIVE
Ketones, UA: NEGATIVE
Nitrite, UA: NEGATIVE
Protein, UA: POSITIVE — AB
Spec Grav, UA: 1.02 (ref 1.010–1.025)
Urobilinogen, UA: 0.2 E.U./dL
pH, UA: 6 (ref 5.0–8.0)

## 2021-01-20 LAB — MICROALBUMIN / CREATININE URINE RATIO
Creatinine,U: 86.9 mg/dL
Microalb Creat Ratio: 42.6 mg/g — ABNORMAL HIGH (ref 0.0–30.0)
Microalb, Ur: 37.1 mg/dL — ABNORMAL HIGH (ref 0.0–1.9)

## 2021-01-20 NOTE — Assessment & Plan Note (Signed)
Per cardiology 

## 2021-01-20 NOTE — Progress Notes (Signed)
Subjective:   By signing my name below, I, Shehryar Baig, attest that this documentation has been prepared under the direction and in the presence of Dr. [name], DO. 01/20/2021    Patient ID: [name], female    DOB: 11-13-1955, 65 y.o.   MRN: [id number]  Chief Complaint  Patient presents with   Annual Exam    Pt states not fasting    HPI Patient is in today for a comprehensive physical exam. She complains of her urine having bad odor and being cloudy. She occasionally wakes up in the middle of the night sweating. She does not know if this is due to her diabetes or a potential UTI. She has not had a urine test because she uses a urostomy bag. She continues to see her GYN, Dr. Denman [name], once a year. She notes that 3-4 months ago she was having issues with vaginal bleeding and her MRI showed she had a fistula on her vagina. Her GYN is managing these issues at this time. She also complains of adhesive rash on her lower abdomen. She continues to use a urostomy bag and colostomy bag which use adhesive on that area. She also notes itching on her forehead. She plans to see another provider to manage these issues.  She started seeing a cardiologist, Dr, Tamala Julian, because she was diagnosed with a primary heart block and a-fib. She continues to see her nephrologist, Dr. Terance Hart, to manage her kidney disease.  She denies having any fever, ear pain, congestion, sinus pain, sore throat, eye pain, chest pain, palpations, cough, SOB, wheezing, n/v/d, constipation, blood in stool or headaches at this time. She has an upcoming eye exam in August, 2022. She had her lab work done yesterday at [facility]. Her provider that manages her diabetes is retiring soon. She is due on the shingles vaccine and is willing to get it. She stated her hematologist did not recommend that she get the new shingles vaccine during her last visit. She has 3 pfizer Covid-19 vaccines at this time. She is willing to get  the 4th one at a later date after she gets the shingles vaccine. She is UTD on tetanus vaccines. She is due for a pneumonia vaccine next year.  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+, HER2 negative/  12-11-2015 bilateral mastecotmy w/ reconstruction (no radiation and no chemo)   Cancer of corpus uteri, except isthmus [facility])  oncologist-- dr Denman [name] and dr Alvy Bimler    10-15-2004  dx endometroid endometrial and ovarian cancer s/p  chemotheapy and surgery(TAH w/ BSO) :  recurrent 11-19-2014 post pelvic surgery and radiation 01-29-2015 to 03-10-2015   Chronic idiopathic neutropenia ([place])    presumed related to chemotherapy March 2006--- followed by dr Alvy Bimler (treatment w/ G-CSF injections   Chronic nausea    Chronic pain    perineal/ anal  area from bladder pad irritates skin , right flank pain   CKD stage G2/A3, GFR 60-89 and albumin creatinine ratio >300 mg/g    nephrologist-  dr Madelon Lips   Colovesical fistula    Diabetic retinopathy, background ([place])    Difficult intravenous access    small veins--- hx PICC lines  DM type 2 (diabetes mellitus, type 2) ([place])    monitored by dr Legrand Como altheimer   Dysuria    Environmental and seasonal allergies    Fatty liver 02/08/2016   CT   Generalized muscle weakness    GERD (gastroesophageal reflux disease)    Hiatal hernia    History of abdominal abscess 04/16/2017   post surgery 04-01-2017  --- resolved 10/ 2018   History of gastric polyp    2014  duodenum   History of ileus 04/16/2017   resolved w/ no surgical intervention   History of radiation therapy    01-29-2015 to 03-10-2015  pelvis 50.4Gy   Hypothyroidism    monitored by dr Legrand Como altheimer   IBS (irritable bowel  syndrome)    Ileostomy in place [facility]) 04/01/2017   created at same time colostomy takedown.   Joint pain    Leg edema    Lower urinary tract symptoms (LUTS)    urge urinary  incontinence   Mixed dyslipidemia    Multiple thyroid nodules    Managed by Dr. Harlow Asa   Nephrostomy status [facility])    Palpitations    Pelvic abscess in female 04/16/2017   PONV (postoperative nausea and vomiting)    "scopolamine patch works for me"   Radiation-induced dermatitis    contact dermatitis , radiation completed, rash only on ankles now.   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   Vitamin D deficiency    Wears glasses     Past Surgical History:  Procedure Laterality Date   APPENDECTOMY     biopsy thyroid nodules     BREAST RECONSTRUCTION WITH PLACEMENT OF TISSUE EXPANDER AND FLEX HD (ACELLULAR HYDRATED DERMIS) Bilateral 12/11/2015   Procedure: BILATERAL BREAST RECONSTRUCTION WITH PLACEMENT OF TISSUE EXPANDERS;  Surgeon: Irene Limbo, MD;  Location: [place];  Service: Plastics;  Laterality: Bilateral;   COLONOSCOPY WITH PROPOFOL N/A 08/21/2013   Procedure: COLONOSCOPY WITH PROPOFOL;  Surgeon: Cleotis Nipper, MD;  [facility];  Service: Endoscopy;  Laterality: N/A;   COLOSTOMY TAKEDOWN N/A 12/04/2014   Procedure: LAPROSCOPIC LYSIS OF ADHESIONS, SPLENIC MOBILIZATION, RELOCATION OF COLOSTOMY, DEBRIDEMENT INITIAL COLOSTOMY SITE;  Surgeon: Michael [name], MD;  [facility];  Service: General;  Laterality: N/A;   CYSTOGRAM N/A 06/01/2017   Procedure: CYSTOGRAM;  Surgeon: Alexis Frock, MD;  [facility];  Service: Urology;  Laterality: N/A;   CYSTOSCOPY W/ RETROGRADES Right 11/21/2015   Procedure: CYSTOSCOPY WITH RETROGRADE PYELOGRAM;  Surgeon: Alexis Frock, MD;  [facility];  Service: Urology;  Laterality: Right;    CYSTOSCOPY W/ URETERAL STENT PLACEMENT Right 11/21/2015   Procedure: CYSTOSCOPY WITH STENT REPLACEMENT;  Surgeon: Alexis Frock, MD;  [facility];  Service: Urology;  Laterality: Right;   CYSTOSCOPY W/ URETERAL STENT PLACEMENT Right 03/10/2016   Procedure: CYSTOSCOPY WITH STENT REPLACEMENT;  Surgeon: Alexis Frock, MD;  Location: [facility];  Service: Urology;  Laterality: Right;   CYSTOSCOPY W/ URETERAL STENT PLACEMENT Right 06/30/2016   Procedure: CYSTOSCOPY WITH RETROGRADE PYELOGRAM/URETERAL STENT EXCHANGE;  Surgeon: Alexis Frock, MD;  Location: [facility];  Service: Urology;  Laterality: Right;   CYSTOSCOPY W/ URETERAL STENT PLACEMENT N/A 06/01/2017   Procedure: CYSTOSCOPY WITH EXAM UNDER ANESTHESIA;  Surgeon: Alexis Frock, MD;  [facility];  Service: Urology;  Laterality: N/A;   CYSTOSCOPY  W/ URETERAL STENT PLACEMENT Right 08/17/2017   Procedure: CYSTOSCOPY WITH RETROGRADE PYELOGRAM/URETERAL STENT REMOVAL;  Surgeon: Alexis Frock, MD;  Location: [facility];  Service: Urology;  Laterality: Right;   CYSTOSCOPY WITH RETROGRADE PYELOGRAM, URETEROSCOPY AND STENT PLACEMENT Right 03/20/2015   Procedure: CYSTOSCOPY WITH RETROGRADE PYELOGRAM, URETEROSCOPY WITH BALLOON DILATION AND STENT PLACEMENT ON RIGHT;  Surgeon: Alexis Frock, MD;  Location: [facility];  Service: Urology;  Laterality: Right;   CYSTOSCOPY WITH RETROGRADE PYELOGRAM, URETEROSCOPY AND STENT PLACEMENT Right 05/02/2015   Procedure: CYSTOSCOPY WITH RIGHT RETROGRADE PYELOGRAM,  DIAGNOSTIC URETEROSCOPY AND STENT PULL ;  Surgeon: Alexis Frock, MD;  Location: [facility];  Service: Urology;  Laterality: Right;   CYSTOSCOPY WITH RETROGRADE PYELOGRAM, URETEROSCOPY AND STENT PLACEMENT Right 09/05/2015   Procedure: CYSTOSCOPY WITH RETROGRADE PYELOGRAM,  AND STENT PLACEMENT;  Surgeon: Alexis Frock, MD;  [facility];  Service: Urology;  Laterality:  Right;   CYSTOSCOPY WITH RETROGRADE PYELOGRAM, URETEROSCOPY AND STENT PLACEMENT Right 04/01/2017   Procedure: CYSTOSCOPY WITH RETROGRADE PYELOGRAM, URETEROSCOPY AND STENT PLACEMENT;  Surgeon: Alexis Frock, MD;  [facility];  Service: Urology;  Laterality: Right;   CYSTOSCOPY WITH STENT PLACEMENT Right 10/27/2016   Procedure: CYSTOSCOPY WITH STENT CHANGE and right retrograde pyelogram;  Surgeon: Alexis Frock, MD;  Location: [facility];  Service: Urology;  Laterality: Right;   EUS N/A 10/02/2014   Procedure: LOWER ENDOSCOPIC ULTRASOUND (EUS);  Surgeon: Arta Silence, MD;  Location: [facility];  Service: Endoscopy;  Laterality: N/A;   EXCISION SOFT TISSUE MASS RIGHT FOREMAN  12-08-2006   EYE SURGERY  as child   pytosis of eyelids repair   INCISION AND DRAINAGE OF WOUND Bilateral 12/26/2015   Procedure: DEBRIDEMENT OF BILATERAL MASTECTOMY FLAPS;  Surgeon: Irene Limbo, MD;  Location: [place];  Service: Plastics;  Laterality: Bilateral;   IR CV LINE INJECTION  05/31/2017   IR FLUORO GUIDE CV LINE LEFT  05/31/2017   IR FLUORO GUIDE CV LINE RIGHT  04/06/2017   IR FLUORO GUIDE CV MIDLINE PICC RIGHT  05/30/2017   IR NEPHROSTOGRAM LEFT INITIAL PLACEMENT  09/02/2017   IR NEPHROSTOGRAM LEFT THRU EXISTING ACCESS  11/29/2017   IR NEPHROSTOGRAM RIGHT INITIAL PLACEMENT  09/02/2017   IR NEPHROSTOGRAM RIGHT THRU EXISTING ACCESS  09/13/2017   IR NEPHROSTOGRAM RIGHT THRU EXISTING ACCESS  11/29/2017   IR NEPHROSTOMY EXCHANGE LEFT  11/28/2017   IR NEPHROSTOMY EXCHANGE LEFT  01/05/2018   IR NEPHROSTOMY EXCHANGE LEFT  02/16/2018   IR NEPHROSTOMY EXCHANGE LEFT  03/30/2018   IR NEPHROSTOMY EXCHANGE LEFT  05/12/2018   IR NEPHROSTOMY EXCHANGE LEFT  06/21/2018   IR NEPHROSTOMY EXCHANGE LEFT  08/04/2018   IR NEPHROSTOMY EXCHANGE LEFT  09/18/2018   IR NEPHROSTOMY EXCHANGE LEFT  10/09/2018   IR NEPHROSTOMY EXCHANGE LEFT  10/27/2018   IR NEPHROSTOMY EXCHANGE LEFT  11/21/2018   IR NEPHROSTOMY  EXCHANGE LEFT  01/05/2019   IR NEPHROSTOMY EXCHANGE LEFT  02/15/2019   IR NEPHROSTOMY EXCHANGE LEFT  03/29/2019   IR NEPHROSTOMY EXCHANGE RIGHT  10/02/2017   IR NEPHROSTOMY EXCHANGE RIGHT  11/28/2017   IR NEPHROSTOMY EXCHANGE RIGHT  01/05/2018   IR NEPHROSTOMY EXCHANGE RIGHT  02/16/2018   IR NEPHROSTOMY EXCHANGE RIGHT  03/30/2018   IR NEPHROSTOMY EXCHANGE RIGHT  05/12/2018   IR NEPHROSTOMY EXCHANGE RIGHT  06/21/2018   IR NEPHROSTOMY EXCHANGE RIGHT  08/04/2018   IR NEPHROSTOMY EXCHANGE RIGHT  09/18/2018   IR NEPHROSTOMY EXCHANGE RIGHT  10/27/2018  IR NEPHROSTOMY EXCHANGE RIGHT  11/21/2018   IR NEPHROSTOMY EXCHANGE RIGHT  01/05/2019   IR NEPHROSTOMY EXCHANGE RIGHT  02/15/2019   IR NEPHROSTOMY EXCHANGE RIGHT  03/29/2019   IR NEPHROSTOMY PLACEMENT LEFT  10/02/2017   IR RADIOLOGIST EVAL & MGMT  05/03/2017   IR US GUIDE VASC ACCESS LEFT  05/31/2017   IR US GUIDE VASC ACCESS RIGHT  04/06/2017   IR US GUIDE VASC ACCESS RIGHT  05/30/2017   LAPAROSCOPIC CHOLECYSTECTOMY  1990   LIPOSUCTION WITH LIPOFILLING Bilateral 04/16/2016   Procedure: LIPOSUCTION WITH LIPOFILLING TO BILATERAL CHEST;  Surgeon: Irene Limbo, MD;  Location: [place];  Service: Plastics;  Laterality: Bilateral;   MASTECTOMY W/ SENTINEL NODE BIOPSY Bilateral 12/11/2015   Procedure: RIGHT PROPHYLACTIC MASTECTOMY, LEFT TOTAL MASTECTOMY WITH LEFT AXILLARY SENTINEL LYMPH NODE BIOPSY;  Surgeon: Stark Klein, MD;  Location: [place];  Service: General;  Laterality: Bilateral;   OSTOMY N/A 11/19/2014   Procedure: OSTOMY;  Surgeon: Michael [name], MD;  [facility];  Service: General;  Laterality: N/A;   PROCTOSCOPY N/A 04/01/2017   Procedure: RIDGE PROCTOSCOPY;  Surgeon: Michael [name], MD;  [facility];  Service: General;  Laterality: N/A;   REMOVAL OF BILATERAL TISSUE EXPANDERS WITH PLACEMENT OF BILATERAL BREAST IMPLANTS Bilateral 04/16/2016   Procedure: REMOVAL OF BILATERAL TISSUE EXPANDERS WITH PLACEMENT OF BILATERAL BREAST IMPLANTS;   Surgeon: Irene Limbo, MD;  Location: [place];  Service: Plastics;  Laterality: Bilateral;   ROBOTIC ASSISTED LAP VAGINAL HYSTERECTOMY N/A 11/19/2014   Procedure: ROBOTIC LYSIS OF ADHESIONS, CONVERTED TO LAPAROTOMY RADICAL UPPER VAGINECTOMY,LOW ANTERIOR BOWEL RESECTION, COLOSTOMY, BILATERAL URETERAL STENT PLACEMENT AND CYSTONOMY CLOSURE;  Surgeon: Everitt Amber, MD;  [facility];  Service: Gynecology;  Laterality: N/A;   TISSUE EXPANDER FILLING Bilateral 12/26/2015   Procedure: EXPANSION OF BILATERAL CHEST TISSUE EXPANDERS (60 mL- Right; 75 mL- Left);  Surgeon: Irene Limbo, MD;  Location: [place];  Service: Plastics;  Laterality: Bilateral;   TONSILLECTOMY     TOTAL ABDOMINAL HYSTERECTOMY  March 2006   [facility]   and Bilateral Salpingoophorectomy/  staging for Ovarian cancer/  an   XI ROBOTIC ASSISTED LOWER ANTERIOR RESECTION N/A 04/01/2017   Procedure: XI ROBOTIC VS LAPAROSCOPIC COLOSTOMY TAKEDOWN WITH LYSIS OF ADHESIONS.;  Surgeon: Michael [name], MD;  [facility];  Service: General;  Laterality: N/A;  ERAS PATHWAY    Family History  Problem Relation Age of Onset   Cancer Mother 6       stomach ca   Hypertension Mother    Cancer Father 69       prostate ca   Diabetes Father    Heart disease Father        CABG   Hypertension Father    Hyperlipidemia Father    Obesity Father    Breast cancer Maternal Aunt        dx in her 35s   Lymphoma Paternal Aunt    Brain cancer Paternal Grandfather    Ovarian cancer Other    Diabetes Sister    Hypertension Brother y-10   Heart disease Brother        CABG   Diabetes Brother     Social History   Socioeconomic History   Marital status: Married    Spouse name: Armed forces technical officer   Number of children: 1   Years of education: Not on file   Highest education level: Not on file  Occupational History   Occupation: retired Therapist, sports from [employer]  Comment: L&D RN - retired  Tobacco Use   Smoking status: Never    Smokeless tobacco: Never  Vaping Use   Vaping Use: Never used  Substance and Sexual Activity   Alcohol use: Not Currently   Drug use: No   Sexual activity: Not on file  Other Topics Concern   Not on file  Social History Narrative   Exercise-- has not gotten back into it since cancer came back   Social Determinants of Health   Financial Resource Strain: Not on file  Food Insecurity: Not on file  Transportation Needs: Not on file  Physical Activity: Not on file  Stress: Not on file  Social Connections: Not on file  Intimate Partner Violence: Not on file   Health Maintenance  Topic Date Due   COVID-19 Vaccine (4 - Booster for Pfizer series) 06/19/2020   HEMOGLOBIN A1C  12/14/2020   INFLUENZA VACCINE  03/02/2021   Zoster Vaccines- Shingrix (2 of 2) 03/17/2021   OPHTHALMOLOGY EXAM  03/19/2021   PNA vac Low Risk Adult (2 of 2 - PPSV23) 08/30/2021   FOOT EXAM  01/20/2022   URINE MICROALBUMIN  01/20/2022   TETANUS/TDAP  08/27/2025   COLONOSCOPY (Pts 45-52yr Insurance coverage will need to be confirmed)  11/20/2030   DEXA SCAN  Completed   Hepatitis C Screening  Completed   HPV VACCINES  Aged Out    Outpatient Medications Prior to Visit  Medication Sig Dispense Refill   acetaminophen (TYLENOL) 325 MG tablet Take 2 tablets (650 mg total) by mouth every 6 (six) hours as needed. 30 tablet 1   albuterol (PROVENTIL HFA) 108 (90 Base) MCG/ACT inhaler Inhale 1 puff into the lungs every 6 (six) hours as needed for wheezing or shortness of breath. 18 g 2   anastrozole (ARIMIDEX) 1 MG tablet TAKE 1 TABLET DAILY 90 tablet 3   aspirin EC 81 MG tablet Take 1 tablet (81 mg total) by mouth every Monday, Wednesday, and Friday. Swallow whole. 90 tablet 3   Biotin 5 MG TABS Take 5 mg by mouth daily.      buPROPion (WELLBUTRIN SR) 200 MG 12 hr tablet Take 1 tablet (200 mg total) by mouth daily. 90 tablet 0   calcium citrate-vitamin D (CITRACAL+D) 315-200 MG-UNIT tablet Take 1 tablet by mouth  daily.     Cholecalciferol (VITAMIN D3) 10000 UNITS capsule Take 10,000 Units by mouth See admin instructions. Take 1 tablet by mouth 4 times per week.     clobetasol (OLUX) 0.05 % topical foam Apply topically 2 (two) times daily.     diphenhydrAMINE (BENADRYL) 25 mg capsule Take 1 capsule (25 mg total) by mouth every 8 (eight) hours as needed for itching, allergies or sleep. 30 capsule 0   ferrous sulfate 325 (65 FE) MG tablet Take 1 tablet (325 mg total) by mouth at bedtime. 30 tablet 3   levothyroxine (SYNTHROID, LEVOTHROID) 150 MCG tablet Take 1 tablet (150 mcg total) by mouth daily before breakfast. 30 tablet 1   loratadine (CLARITIN) 10 MG tablet Take 10 mg by mouth daily.      Melatonin 10 MG TABS Take 1 tablet by mouth at bedtime as needed.     mupirocin ointment (BACTROBAN) 2 % Place 1 application into the nose 2 (two) times daily. 22 g 0   omega-3 acid ethyl esters (LOVAZA) 1 G capsule Take 1 g by mouth 2 (two) times daily.      Polyethyl Glycol-Propyl Glycol (SYSTANE OP) Place 1 drop  into both eyes daily as needed (dry eyes).      Prenatal Vit-Fe Fumarate-FA (PRENATAL VITAMIN PO) Take 1 capsule by mouth daily. Takes prenatal because there are no dyes in it     Probiotic Product (ALIGN PO) Take by mouth 2 (two) times daily.     rifaximin (XIFAXAN) 550 MG TABS tablet Take 550 mg by mouth See admin instructions. Take one tablet by mouth three times daily for 14 days when needed.     rosuvastatin (CRESTOR) 10 MG tablet Take 10 mg by mouth every evening.      Saccharomyces boulardii (FLORASTOR PO) Take 1 capsule by mouth daily.     Semaglutide (RYBELSUS) 14 MG TABS Take 1 tablet by mouth daily. 90 tablet 0   Tbo-Filgrastim (GRANIX) 480 MCG/0.8ML SOSY injection 480 mcg subcutaneous injection every 6 days life long 11.2 mL 11   pregabalin (LYRICA) 50 MG capsule Take 1 capsule (50 mg total) by mouth 2 (two) times daily. 60 capsule 5   No facility-administered medications prior to visit.     Allergies  Allergen Reactions   Penicillins Swelling    Facial swelling/childhood allergy Has patient had a PCN reaction causing immediate rash, facial/tongue/throat swelling, SOB or lightheadedness with hypotension: Yes Has patient had a PCN reaction causing severe rash involving mucus membranes or skin necrosis: Yes Has patient had a PCN reaction that required hospitalization yes Has patient had a PCN reaction occurring within the last 10 years: No If all of the above answers are "NO", then may proceed with Cephalosporin use.    Cefaclor Rash    Ceclor   Erythromycin Other (See Comments)    Gastritis, abd cramps   Tape Rash    blisters   Trimethoprim Rash   Ultram [Tramadol] Hives   Zarxio [Filgrastim] Other (See Comments)    Post injection, elevated heart rate, body feeling unwell, uneasy.    Cephalosporins Rash   Fluconazole Rash   Neomycin Rash    blisters   Oxycodone Other (See Comments)    " I just feel weird"   Pectin Rash    Pectin ring for stoma   Septra [Sulfamethoxazole-Trimethoprim] Rash   Sulfa Antibiotics Rash    Review of Systems  Constitutional:  Negative for fever.  HENT:  Negative for congestion, ear pain, sinus pain and sore throat.   Eyes:  Negative for pain.  Respiratory:  Negative for cough, shortness of breath and wheezing.   Cardiovascular:  Negative for chest pain and palpitations.  Gastrointestinal:  Negative for blood in stool, constipation, diarrhea, nausea and vomiting.  Skin:  Positive for itching (Forehead) and rash (Lower abdomen).  Neurological:  Negative for headaches.      Objective:    Physical Exam Constitutional:      General: She is not in acute distress.    Appearance: Normal appearance. She is not ill-appearing.  HENT:     Head: Normocephalic and atraumatic.     Right Ear: Tympanic membrane and external ear normal.     Left Ear: Tympanic membrane and external ear normal.  Eyes:     Extraocular Movements: Extraocular  movements intact.     Pupils: Pupils are equal, round, and reactive to light.  Cardiovascular:     Rate and Rhythm: Normal rate and regular rhythm.     Pulses: Normal pulses.     Heart sounds: Normal heart sounds. No murmur heard.   No gallop.  Pulmonary:     Effort: Pulmonary effort is normal.  No respiratory distress.     Breath sounds: Normal breath sounds. No wheezing, rhonchi or rales.  Feet:     Comments: Diabetic Foot Exam - Simple   No data filed    Skin:    General: Skin is warm and dry.     Comments: Redness and rash found on lower abdominal fold.  Neurological:     Mental Status: She is alert and oriented to person, place, and time.  Psychiatric:        Behavior: Behavior normal.    BP 120/70 (BP Location: Right Arm, Patient Position: Sitting, Cuff Size: Large)   Pulse 69   Temp (!) 97.3 F (36.3 C) (Oral)   Resp 20   Ht 5' 2"  (1.575 m)   Wt 209 lb 6.4 oz (95 kg)   SpO2 97%   BMI 38.30 kg/m  Wt Readings from Last 3 Encounters:  01/20/21 209 lb 6.4 oz (95 kg)  01/12/21 207 lb (93.9 kg)  12/15/20 210 lb (95.3 kg)    Diabetic Foot Exam - Simple   Simple Foot Form Diabetic Foot exam was performed with the following findings: Yes 01/20/2021  1:38 PM  Visual Inspection No deformities, no ulcerations, no other skin breakdown bilaterally: Yes Sensation Testing Intact to touch and monofilament testing bilaterally: Yes Pulse Check Posterior Tibialis and Dorsalis pulse intact bilaterally: Yes Comments    Lab Results  Component Value Date   WBC 6.5 09/30/2020   HGB 12.0 09/30/2020   HCT 37.0 09/30/2020   PLT 195 09/30/2020   GLUCOSE 92 06/16/2020   CHOL 92 (L) 06/16/2020   TRIG 128 06/16/2020   HDL 49 06/16/2020   LDLCALC 21 06/16/2020   ALT 33 (H) 06/16/2020   AST 25 06/16/2020   NA 144 06/16/2020   K 4.0 06/16/2020   CL 104 06/16/2020   CREATININE 1.64 (H) 06/16/2020   BUN 32 (H) 06/16/2020   CO2 22 06/16/2020   TSH 3.38 01/18/2020   INR 1.0  03/29/2019   HGBA1C 6.3 (H) 06/16/2020   MICROALBUR 37.1 (H) 01/20/2021    Lab Results  Component Value Date   TSH 3.38 01/18/2020   Lab Results  Component Value Date   WBC 6.5 09/30/2020   HGB 12.0 09/30/2020   HCT 37.0 09/30/2020   MCV 95.6 09/30/2020   PLT 195 09/30/2020   Lab Results  Component Value Date   NA 144 06/16/2020   K 4.0 06/16/2020   CHLORIDE 104 01/24/2017   CO2 22 06/16/2020   GLUCOSE 92 06/16/2020   BUN 32 (H) 06/16/2020   CREATININE 1.64 (H) 06/16/2020   BILITOT 0.3 06/16/2020   ALKPHOS 175 (H) 06/16/2020   AST 25 06/16/2020   ALT 33 (H) 06/16/2020   PROT 7.8 06/16/2020   ALBUMIN 4.4 06/16/2020   CALCIUM 9.4 06/16/2020   ANIONGAP 9 10/02/2019   EGFR 79 (L) 01/24/2017   GFR 59.80 (L) 02/26/2016   Lab Results  Component Value Date   CHOL 92 (L) 06/16/2020   Lab Results  Component Value Date   HDL 49 06/16/2020   Lab Results  Component Value Date   LDLCALC 21 06/16/2020   Lab Results  Component Value Date   TRIG 128 06/16/2020   Lab Results  Component Value Date   CHOLHDL 1.9 01/18/2020   Lab Results  Component Value Date   HGBA1C 6.3 (H) 06/16/2020   Mammogram- History of mastectomy.  Pap Smear- History of salpingo oophorectomy.   Dexa- Last completed  01/17/2018. Results normal.  Colonoscopy- Last completed 11/19/2020. Results normal. Repeat in 5 years.     Assessment & Plan:   Problem List Items Addressed This Visit       Unprioritized   Paroxysmal atrial fibrillation ([place])   CKD (chronic kidney disease), stage III ([place])    Per [facility]       DM (diabetes mellitus) type II, controlled, with peripheral vascular disorder ([place])   Relevant Orders   Microalbumin / creatinine urine ratio (Completed)   Essential (hemorrhagic) thrombocythemia (HCC)   First degree heart block    Per cardiology       HLD (hyperlipidemia)    Encourage heart healthy diet such as MIND or DASH diet, increase exercise, avoid trans  fats, simple carbohydrates and processed foods, consider a krill or fish or flaxseed oil cap daily.        Primary hypothyroidism    Per endo       Type II diabetes mellitus with renal manifestations (HCC)    Per endo        UTI (urinary tract infection)    Recheck today       Vesicorectal fistula    Per gyn        Vitamin D insufficiency    Per endo        Preventative health care - Primary    ghm utd Check labs         RESOLVED: Protein-calorie malnutrition, moderate (HCC)    Resolved        Other Visit Diagnoses     Abnormal urine odor       Relevant Orders   POCT Urinalysis Dipstick (Automated) (Completed)   Urine Culture   Ovarian cancer, unspecified laterality (HCC)   (Chronic)     Colostomy status (HCC)   (Chronic)     Major depressive disorder, single episode, moderate (HCC)   (Chronic)     Need for shingles vaccine       Relevant Orders   Varicella-zoster vaccine IM (Completed)        No orders of the defined types were placed in this encounter.   I, Dr. [name], DO, personally preformed the services described in this documentation.  All medical record entries made by the scribe were at my direction and in my presence.  I have reviewed the chart and discharge instructions (if applicable) and agree that the record reflects my personal performance and is accurate and complete. 01/20/2021   I,Shehryar Baig,acting as a Education administrator for [employer], DO.,have documented all relevant documentation on the behalf of Ann Held, DO,as directed by  Ann Held, DO while in the presence of Ann Held, DO.   Ann Held, DO

## 2021-01-20 NOTE — Assessment & Plan Note (Signed)
Recheck today. 

## 2021-01-20 NOTE — Assessment & Plan Note (Signed)
Per endo °

## 2021-01-20 NOTE — Assessment & Plan Note (Signed)
Per [facility]

## 2021-01-20 NOTE — Assessment & Plan Note (Signed)
Per gyn 

## 2021-01-20 NOTE — Telephone Encounter (Signed)
Requesting: Lyrica 50mg   Contract: None UDS: 02/17/2018 Last Visit: 01/20/2021  Next Visit: 07/23/2021 Last Refill: 07/22/2020 #60 and 5RF  Please Advise

## 2021-01-20 NOTE — Patient Instructions (Signed)
Preventive Care 66 Years and Older, Female Preventive care refers to lifestyle choices and visits with your health care provider that can promote health and wellness. This includes: A yearly physical exam. This is also called an annual wellness visit. Regular dental and eye exams. Immunizations. Screening for certain conditions. Healthy lifestyle choices, such as: Eating a healthy diet. Getting regular exercise. Not using drugs or products that contain nicotine and tobacco. Limiting alcohol use. What can I expect for my preventive care visit? Physical exam Your health care provider will check your: Height and weight. These may be used to calculate your BMI (body mass index). BMI is a measurement that tells if you are at a healthy weight. Heart rate and blood pressure. Body temperature. Skin for abnormal spots. Counseling Your health care provider may ask you questions about your: Past medical problems. Family's medical history. Alcohol, tobacco, and drug use. Emotional well-being. Home life and relationship well-being. Sexual activity. Diet, exercise, and sleep habits. History of falls. Memory and ability to understand (cognition). Work and work Statistician. Pregnancy and menstrual history. Access to firearms. What immunizations do I need?  Vaccines are usually given at various ages, according to a schedule. Your health care provider will recommend vaccines for you based on your age, medicalhistory, and lifestyle or other factors, such as travel or where you work. What tests do I need? Blood tests Lipid and cholesterol levels. These may be checked every 5 years, or more often depending on your overall health. Hepatitis C test. Hepatitis B test. Screening Lung cancer screening. You may have this screening every year starting at age 44 if you have a 30-pack-year history of smoking and currently smoke or have quit within the past 15 years. Colorectal cancer screening. All  adults should have this screening starting at age 39 and continuing until age 65. Your health care provider may recommend screening at age 61 if you are at increased risk. You will have tests every 1-10 years, depending on your results and the type of screening test. Diabetes screening. This is done by checking your blood sugar (glucose) after you have not eaten for a while (fasting). You may have this done every 1-3 years. Mammogram. This may be done every 1-2 years. Talk with your health care provider about how often you should have regular mammograms. Abdominal aortic aneurysm (AAA) screening. You may need this if you are a current or former smoker. BRCA-related cancer screening. This may be done if you have a family history of breast, ovarian, tubal, or peritoneal cancers. Other tests STD (sexually transmitted disease) testing, if you are at risk. Bone density scan. This is done to screen for osteoporosis. You may have this done starting at age 54. Talk with your health care provider about your test results, treatment options,and if necessary, the need for more tests. Follow these instructions at home: Eating and drinking  Eat a diet that includes fresh fruits and vegetables, whole grains, lean protein, and low-fat dairy products. Limit your intake of foods with high amounts of sugar, saturated fats, and salt. Take vitamin and mineral supplements as recommended by your health care provider. Do not drink alcohol if your health care provider tells you not to drink. If you drink alcohol: Limit how much you have to 0-1 drink a day. Be aware of how much alcohol is in your drink. In the [place] one drink equals one 12 oz bottle of beer (355 mL), one 5 oz glass of wine (148 mL), or one 1  oz glass of hard liquor (44 mL).  Lifestyle Take daily care of your teeth and gums. Brush your teeth every morning and night with fluoride toothpaste. Floss one time each day. Stay active. Exercise for at  least 30 minutes 5 or more days each week. Do not use any products that contain nicotine or tobacco, such as cigarettes, e-cigarettes, and chewing tobacco. If you need help quitting, ask your health care provider. Do not use drugs. If you are sexually active, practice safe sex. Use a condom or other form of protection in order to prevent STIs (sexually transmitted infections). Talk with your health care provider about taking a low-dose aspirin or statin. Find healthy ways to cope with stress, such as: Meditation, yoga, or listening to music. Journaling. Talking to a trusted person. Spending time with friends and family. Safety Always wear your seat belt while driving or riding in a vehicle. Do not drive: If you have been drinking alcohol. Do not ride with someone who has been drinking. When you are tired or distracted. While texting. Wear a helmet and other protective equipment during sports activities. If you have firearms in your house, make sure you follow all gun safety procedures. What's next? Visit your health care provider once a year for an annual wellness visit. Ask your health care provider how often you should have your eyes and teeth checked. Stay up to date on all vaccines. This information is not intended to replace advice given to you by your health care provider. Make sure you discuss any questions you have with your healthcare provider. Document Revised: 07/09/2020 Document Reviewed: 07/13/2018 Elsevier Patient Education  2022 [employer].

## 2021-01-20 NOTE — Assessment & Plan Note (Signed)
Encourage heart healthy diet such as MIND or DASH diet, increase exercise, avoid trans fats, simple carbohydrates and processed foods, consider a krill or fish or flaxseed oil cap daily.  °

## 2021-01-21 ENCOUNTER — Other Ambulatory Visit: Payer: Self-pay

## 2021-01-21 ENCOUNTER — Encounter: Payer: Self-pay | Admitting: Dermatology

## 2021-01-21 ENCOUNTER — Ambulatory Visit (INDEPENDENT_AMBULATORY_CARE_PROVIDER_SITE_OTHER): Payer: BC Managed Care – PPO | Admitting: Dermatology

## 2021-01-21 ENCOUNTER — Encounter: Payer: Self-pay | Admitting: Family Medicine

## 2021-01-21 DIAGNOSIS — L570 Actinic keratosis: Secondary | ICD-10-CM

## 2021-01-21 DIAGNOSIS — D1801 Hemangioma of skin and subcutaneous tissue: Secondary | ICD-10-CM | POA: Diagnosis not present

## 2021-01-21 DIAGNOSIS — L814 Other melanin hyperpigmentation: Secondary | ICD-10-CM

## 2021-01-21 DIAGNOSIS — Z1283 Encounter for screening for malignant neoplasm of skin: Secondary | ICD-10-CM | POA: Diagnosis not present

## 2021-01-21 DIAGNOSIS — L309 Dermatitis, unspecified: Secondary | ICD-10-CM

## 2021-01-21 LAB — URINE CULTURE
MICRO NUMBER:: 12032242
SPECIMEN QUALITY:: ADEQUATE

## 2021-01-21 MED ORDER — CLOBETASOL PROPIONATE 0.05 % EX FOAM: Freq: Two times a day (BID) | CUTANEOUS | 3 refills | Status: AC

## 2021-01-21 NOTE — Assessment & Plan Note (Signed)
Resolved

## 2021-01-21 NOTE — Assessment & Plan Note (Signed)
ghm utd Check labs  

## 2021-01-21 NOTE — Progress Notes (Signed)
   Follow-Up Visit   Subjective  [name] is a 65 y.o. female who presents for the following: Skin Problem (Patient here today for lesion on left forehead x 2- 3 months crusty, itchy, and irritating. Only bleeding when picking. No personal history of melanoma or non mole skin cancer. Family history of non mole skin cancer. No family history of atypical moles or melanoma).  General skin examination Location:  Duration:  Quality:  Associated Signs/Symptoms: Modifying Factors:  Severity:  Timing: Context:   Objective  Well appearing patient in no apparent distress; mood and affect are within normal limits. Left Inguinal Area Acro lentigo right great toe   Chest - Medial [facility]), Umbilicus Chest, abdomen =many    A full examination was performed including scalp, head, eyes, ears, nose, lips, neck, chest, axillae, abdomen, back, buttocks, bilateral upper extremities, bilateral lower extremities, hands, feet, fingers, toes, fingernails, and toenails. All findings within normal limits unless otherwise noted below.  Areas beneath undergarments not fully examined.   Assessment & Plan    Screening exam for skin cancer Left Inguinal Area  Cherry angioma (2) Chest - Medial [facility]); Umbilicus  AK (actinic keratosis) Scalp  Destruction of lesion - Scalp Complexity: simple   Destruction method: cryotherapy   Informed consent: discussed and consent obtained   Timeout:  patient name, date of birth, surgical site, and procedure verified Lesion destroyed using liquid nitrogen: Yes   Cryotherapy cycles:  4 Outcome: patient tolerated procedure well with no complications   Post-procedure details: wound care instructions given     Some a refill clobetasol foam for the episodic flareup of dermatitis in the frontal scalp   I, Lavonna Monarch, MD, have reviewed all documentation for this visit.  The documentation on 01/21/21 for the exam, diagnosis, procedures, and orders are  all accurate and complete.

## 2021-01-22 DIAGNOSIS — I129 Hypertensive chronic kidney disease with stage 1 through stage 4 chronic kidney disease, or unspecified chronic kidney disease: Secondary | ICD-10-CM | POA: Diagnosis not present

## 2021-01-22 DIAGNOSIS — E1169 Type 2 diabetes mellitus with other specified complication: Secondary | ICD-10-CM | POA: Diagnosis not present

## 2021-01-22 DIAGNOSIS — N184 Chronic kidney disease, stage 4 (severe): Secondary | ICD-10-CM | POA: Diagnosis not present

## 2021-01-22 DIAGNOSIS — E1122 Type 2 diabetes mellitus with diabetic chronic kidney disease: Secondary | ICD-10-CM | POA: Diagnosis not present

## 2021-01-28 NOTE — Progress Notes (Signed)
Cardiology Office Note:    Date:  01/29/2021   ID:  [name], DOB 05/23/1956, MRN [id number]  PCP:  Carollee Herter, Alferd Apa, DO  Cardiologist:  Sinclair Grooms, MD   Referring MD: Carollee Herter, Alferd Apa, *   Chief Complaint  Patient presents with   Atrial Fibrillation     History of Present Illness:    [name] is a 65 y.o. female with a hx of DOE, PAF (1% burden), hyperlipidemia, DM II,  Essential hypertension, CKD 3, and first degree AVB.   Finally able to get her into discuss abnormal findings of cardiac monitor.  She continues to have episodes of atrial fibrillation, presumed.  We did document a density of approximately 1% atrial fibrillation on the monitor performed a year ago (see below).  On a 30-day monitor she had about 60 minutes of atrial fibrillation.  She has CKD.  We talked about the pros and cons of A. fib management with chronic anticoagulation.  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+, HER2 negative/  12-11-2015 bilateral mastecotmy w/ reconstruction (no radiation and no chemo)   Cancer of corpus uteri, except isthmus [facility])  oncologist-- dr Denman [name] and dr Alvy Bimler    10-15-2004  dx endometroid endometrial and ovarian cancer s/p  chemotheapy and surgery(TAH w/ BSO) :  recurrent 11-19-2014 post pelvic surgery and radiation 01-29-2015 to 03-10-2015   Chronic idiopathic neutropenia ([place])    presumed related to chemotherapy March 2006--- followed by dr Alvy Bimler (treatment w/ G-CSF injections   Chronic nausea    Chronic pain    perineal/ anal  area from bladder pad irritates skin , right flank pain   CKD stage G2/A3, GFR 60-89 and  albumin creatinine ratio >300 mg/g    nephrologist-  dr Madelon Lips   Colovesical fistula    Diabetic retinopathy, background ([place])    Difficult intravenous access    small veins--- hx PICC lines   DM type 2 (diabetes mellitus, type 2) ([place])    monitored by dr Legrand Como altheimer   Dysuria    Environmental and seasonal allergies    Fatty liver 02/08/2016   CT   Generalized muscle weakness    GERD (gastroesophageal reflux disease)    Hiatal hernia    History of abdominal abscess 04/16/2017   post surgery 04-01-2017  --- resolved 10/ 2018   History of gastric polyp    2014  duodenum   History of ileus 04/16/2017   resolved w/ no surgical intervention   History of radiation therapy    01-29-2015 to 03-10-2015  pelvis 50.4Gy   Hypothyroidism    monitored by dr Legrand Como altheimer   IBS (irritable bowel syndrome)    Ileostomy in place ([place]) 04/01/2017   created at same time colostomy takedown.   Joint pain    Leg edema    Lower urinary tract symptoms (LUTS)    urge urinary  incontinence   Mixed dyslipidemia    Multiple thyroid nodules    Managed by Dr. Harlow Asa   Nephrostomy status [facility])    Palpitations    Pelvic abscess in female  04/16/2017   PONV (postoperative nausea and vomiting)    "scopolamine patch works for me"   Radiation-induced dermatitis    contact dermatitis , radiation completed, rash only on ankles now.   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   Vitamin D deficiency    Wears glasses     Past Surgical History:  Procedure Laterality Date   APPENDECTOMY     biopsy thyroid nodules     BREAST RECONSTRUCTION WITH PLACEMENT OF TISSUE EXPANDER AND FLEX HD (ACELLULAR HYDRATED DERMIS) Bilateral 12/11/2015   Procedure: BILATERAL BREAST RECONSTRUCTION WITH PLACEMENT OF TISSUE EXPANDERS;  Surgeon: Irene Limbo, MD;  Location: [place];  Service: Plastics;  Laterality: Bilateral;   COLONOSCOPY WITH PROPOFOL N/A 08/21/2013   Procedure: COLONOSCOPY WITH PROPOFOL;  Surgeon: Cleotis Nipper, MD;  [facility];  Service: Endoscopy;  Laterality: N/A;   COLOSTOMY TAKEDOWN N/A 12/04/2014   Procedure: LAPROSCOPIC LYSIS OF ADHESIONS, SPLENIC MOBILIZATION, RELOCATION OF COLOSTOMY, DEBRIDEMENT INITIAL COLOSTOMY SITE;  Surgeon: Michael [name], MD;  [facility];  Service: General;  Laterality: N/A;   CYSTOGRAM N/A 06/01/2017   Procedure: CYSTOGRAM;  Surgeon: Alexis Frock, MD;  [facility];  Service: Urology;  Laterality: N/A;   CYSTOSCOPY W/ RETROGRADES Right 11/21/2015   Procedure: CYSTOSCOPY WITH RETROGRADE PYELOGRAM;  Surgeon: Alexis Frock, MD;  [facility];  Service: Urology;  Laterality: Right;   CYSTOSCOPY W/ URETERAL STENT PLACEMENT Right 11/21/2015   Procedure: CYSTOSCOPY WITH STENT REPLACEMENT;  Surgeon: Alexis Frock, MD;  [facility];  Service: Urology;  Laterality: Right;   CYSTOSCOPY W/ URETERAL STENT PLACEMENT Right 03/10/2016   Procedure: CYSTOSCOPY WITH STENT REPLACEMENT;  Surgeon: Alexis Frock, MD;  Location: [facility];  Service: Urology;  Laterality: Right;   CYSTOSCOPY W/ URETERAL STENT PLACEMENT Right 06/30/2016   Procedure: CYSTOSCOPY WITH RETROGRADE PYELOGRAM/URETERAL STENT EXCHANGE;  Surgeon: Alexis Frock, MD;  Location: [facility];  Service: Urology;  Laterality: Right;   CYSTOSCOPY W/ URETERAL STENT PLACEMENT N/A 06/01/2017   Procedure: CYSTOSCOPY WITH EXAM UNDER ANESTHESIA;  Surgeon: Alexis Frock, MD;  [facility];  Service: Urology;  Laterality: N/A;   CYSTOSCOPY W/ URETERAL STENT PLACEMENT Right 08/17/2017   Procedure: CYSTOSCOPY WITH RETROGRADE PYELOGRAM/URETERAL STENT REMOVAL;  Surgeon: Alexis Frock, MD;  Location: [facility];  Service: Urology;  Laterality: Right;   CYSTOSCOPY WITH  RETROGRADE PYELOGRAM, URETEROSCOPY AND STENT PLACEMENT Right 03/20/2015   Procedure: CYSTOSCOPY WITH RETROGRADE PYELOGRAM, URETEROSCOPY WITH BALLOON DILATION AND STENT PLACEMENT ON RIGHT;  Surgeon: Alexis Frock, MD;  Location: [facility];  Service: Urology;  Laterality: Right;   CYSTOSCOPY WITH RETROGRADE PYELOGRAM, URETEROSCOPY AND STENT PLACEMENT Right 05/02/2015   Procedure: CYSTOSCOPY WITH RIGHT RETROGRADE PYELOGRAM,  DIAGNOSTIC URETEROSCOPY AND STENT PULL ;  Surgeon: Alexis Frock, MD;  Location: [facility];  Service: Urology;  Laterality: Right;   CYSTOSCOPY WITH RETROGRADE PYELOGRAM, URETEROSCOPY AND STENT PLACEMENT Right 09/05/2015   Procedure: CYSTOSCOPY WITH RETROGRADE PYELOGRAM,  AND STENT PLACEMENT;  Surgeon: Alexis Frock, MD;  [facility];  Service: Urology;  Laterality: Right;   CYSTOSCOPY WITH RETROGRADE PYELOGRAM, URETEROSCOPY AND STENT PLACEMENT Right 04/01/2017   Procedure: CYSTOSCOPY WITH RETROGRADE PYELOGRAM, URETEROSCOPY AND STENT PLACEMENT;  Surgeon: Alexis Frock, MD;  [facility];  Service: Urology;  Laterality: Right;   CYSTOSCOPY  WITH STENT PLACEMENT Right 10/27/2016   Procedure: CYSTOSCOPY WITH STENT CHANGE and right retrograde pyelogram;  Surgeon: Alexis Frock, MD;  Location: [facility];  Service: Urology;  Laterality: Right;   EUS N/A 10/02/2014   Procedure: LOWER ENDOSCOPIC ULTRASOUND (EUS);  Surgeon: Arta Silence, MD;  Location: [facility];  Service: Endoscopy;  Laterality: N/A;   EXCISION SOFT TISSUE MASS RIGHT FOREMAN  12-08-2006   EYE SURGERY  as child   pytosis of eyelids repair   INCISION AND DRAINAGE OF WOUND Bilateral 12/26/2015   Procedure: DEBRIDEMENT OF BILATERAL MASTECTOMY FLAPS;  Surgeon: Irene Limbo, MD;  Location: [place];  Service: Plastics;  Laterality: Bilateral;   IR CV LINE INJECTION  05/31/2017   IR FLUORO GUIDE CV LINE LEFT  05/31/2017   IR FLUORO GUIDE CV LINE  RIGHT  04/06/2017   IR FLUORO GUIDE CV MIDLINE PICC RIGHT  05/30/2017   IR NEPHROSTOGRAM LEFT INITIAL PLACEMENT  09/02/2017   IR NEPHROSTOGRAM LEFT THRU EXISTING ACCESS  11/29/2017   IR NEPHROSTOGRAM RIGHT INITIAL PLACEMENT  09/02/2017   IR NEPHROSTOGRAM RIGHT THRU EXISTING ACCESS  09/13/2017   IR NEPHROSTOGRAM RIGHT THRU EXISTING ACCESS  11/29/2017   IR NEPHROSTOMY EXCHANGE LEFT  11/28/2017   IR NEPHROSTOMY EXCHANGE LEFT  01/05/2018   IR NEPHROSTOMY EXCHANGE LEFT  02/16/2018   IR NEPHROSTOMY EXCHANGE LEFT  03/30/2018   IR NEPHROSTOMY EXCHANGE LEFT  05/12/2018   IR NEPHROSTOMY EXCHANGE LEFT  06/21/2018   IR NEPHROSTOMY EXCHANGE LEFT  08/04/2018   IR NEPHROSTOMY EXCHANGE LEFT  09/18/2018   IR NEPHROSTOMY EXCHANGE LEFT  10/09/2018   IR NEPHROSTOMY EXCHANGE LEFT  10/27/2018   IR NEPHROSTOMY EXCHANGE LEFT  11/21/2018   IR NEPHROSTOMY EXCHANGE LEFT  01/05/2019   IR NEPHROSTOMY EXCHANGE LEFT  02/15/2019   IR NEPHROSTOMY EXCHANGE LEFT  03/29/2019   IR NEPHROSTOMY EXCHANGE RIGHT  10/02/2017   IR NEPHROSTOMY EXCHANGE RIGHT  11/28/2017   IR NEPHROSTOMY EXCHANGE RIGHT  01/05/2018   IR NEPHROSTOMY EXCHANGE RIGHT  02/16/2018   IR NEPHROSTOMY EXCHANGE RIGHT  03/30/2018   IR NEPHROSTOMY EXCHANGE RIGHT  05/12/2018   IR NEPHROSTOMY EXCHANGE RIGHT  06/21/2018   IR NEPHROSTOMY EXCHANGE RIGHT  08/04/2018   IR NEPHROSTOMY EXCHANGE RIGHT  09/18/2018   IR NEPHROSTOMY EXCHANGE RIGHT  10/27/2018   IR NEPHROSTOMY EXCHANGE RIGHT  11/21/2018   IR NEPHROSTOMY EXCHANGE RIGHT  01/05/2019   IR NEPHROSTOMY EXCHANGE RIGHT  02/15/2019   IR NEPHROSTOMY EXCHANGE RIGHT  03/29/2019   IR NEPHROSTOMY PLACEMENT LEFT  10/02/2017   IR RADIOLOGIST EVAL & MGMT  05/03/2017   IR US GUIDE VASC ACCESS LEFT  05/31/2017   IR US GUIDE VASC ACCESS RIGHT  04/06/2017   IR US GUIDE VASC ACCESS RIGHT  05/30/2017   LAPAROSCOPIC CHOLECYSTECTOMY  1990   LIPOSUCTION WITH LIPOFILLING Bilateral 04/16/2016   Procedure: LIPOSUCTION WITH LIPOFILLING TO BILATERAL CHEST;  Surgeon: Irene Limbo, MD;  Location: [place];  Service: Plastics;  Laterality: Bilateral;   MASTECTOMY W/ SENTINEL NODE BIOPSY Bilateral 12/11/2015   Procedure: RIGHT PROPHYLACTIC MASTECTOMY, LEFT TOTAL MASTECTOMY WITH LEFT AXILLARY SENTINEL LYMPH NODE BIOPSY;  Surgeon: Stark Klein, MD;  Location: [place];  Service: General;  Laterality: Bilateral;   OSTOMY N/A 11/19/2014   Procedure: OSTOMY;  Surgeon: Michael [name], MD;  [facility];  Service: General;  Laterality: N/A;   PROCTOSCOPY N/A 04/01/2017   Procedure: RIDGE PROCTOSCOPY;  Surgeon: Michael [name], MD;  [facility];  Service: General;  Laterality: N/A;   REMOVAL OF BILATERAL TISSUE EXPANDERS WITH PLACEMENT OF BILATERAL BREAST IMPLANTS Bilateral 04/16/2016   Procedure: REMOVAL OF BILATERAL TISSUE EXPANDERS WITH PLACEMENT OF BILATERAL BREAST IMPLANTS;  Surgeon: Irene Limbo, MD;  Location: [place];  Service: Plastics;  Laterality: Bilateral;   ROBOTIC ASSISTED LAP VAGINAL HYSTERECTOMY N/A 11/19/2014   Procedure: ROBOTIC LYSIS OF ADHESIONS, CONVERTED TO LAPAROTOMY RADICAL UPPER VAGINECTOMY,LOW ANTERIOR BOWEL RESECTION, COLOSTOMY, BILATERAL URETERAL STENT PLACEMENT AND CYSTONOMY CLOSURE;  Surgeon: Everitt Amber, MD;  [facility];  Service: Gynecology;  Laterality: N/A;   TISSUE EXPANDER FILLING Bilateral 12/26/2015   Procedure: EXPANSION OF BILATERAL CHEST TISSUE EXPANDERS (60 mL- Right; 75 mL- Left);  Surgeon: Irene Limbo, MD;  Location: [place];  Service: Plastics;  Laterality: Bilateral;   TONSILLECTOMY     TOTAL ABDOMINAL HYSTERECTOMY  March 2006   [facility]   and Bilateral Salpingoophorectomy/  staging for Ovarian cancer/  an   XI ROBOTIC ASSISTED LOWER ANTERIOR RESECTION N/A 04/01/2017   Procedure: XI ROBOTIC VS LAPAROSCOPIC COLOSTOMY TAKEDOWN WITH LYSIS OF ADHESIONS.;  Surgeon: Michael [name], MD;  [facility];  Service: General;  Laterality: N/A;  ERAS PATHWAY    Current  Medications: Current Meds  Medication Sig   acetaminophen (TYLENOL) 325 MG tablet Take 2 tablets (650 mg total) by mouth every 6 (six) hours as needed.   albuterol (PROVENTIL HFA) 108 (90 Base) MCG/ACT inhaler Inhale 1 puff into the lungs every 6 (six) hours as needed for wheezing or shortness of breath.   anastrozole (ARIMIDEX) 1 MG tablet TAKE 1 TABLET DAILY   Biotin 5 MG TABS Take 5 mg by mouth daily.    buPROPion (WELLBUTRIN SR) 200 MG 12 hr tablet Take 1 tablet (200 mg total) by mouth daily.   calcium citrate-vitamin D (CITRACAL+D) 315-200 MG-UNIT tablet Take 1 tablet by mouth daily.   Cholecalciferol (VITAMIN D3) 10000 UNITS capsule Take 10,000 Units by mouth See admin instructions. Take 1 tablet by mouth 4 times per week.   clobetasol (OLUX) 0.05 % topical foam Apply topically 2 (two) times daily.   diphenhydrAMINE (BENADRYL) 25 mg capsule Take 1 capsule (25 mg total) by mouth every 8 (eight) hours as needed for itching, allergies or sleep.   ferrous sulfate 325 (65 FE) MG tablet Take 1 tablet (325 mg total) by mouth at bedtime.   levothyroxine (SYNTHROID, LEVOTHROID) 150 MCG tablet Take 1 tablet (150 mcg total) by mouth daily before breakfast.   loratadine (CLARITIN) 10 MG tablet Take 10 mg by mouth daily.    Melatonin 10 MG TABS Take 1 tablet by mouth at bedtime as needed.   mupirocin ointment (BACTROBAN) 2 % Place 1 application into the nose 2 (two) times daily.   omega-3 acid ethyl esters (LOVAZA) 1 G capsule Take 1 g by mouth 2 (two) times daily.    Polyethyl Glycol-Propyl Glycol (SYSTANE OP) Place 1 drop into both eyes daily as needed (dry eyes).    pregabalin (LYRICA) 50 MG capsule TAKE 1 CAPSULE TWICE A DAY   Prenatal Vit-Fe Fumarate-FA (PRENATAL VITAMIN PO) Take 1 capsule by mouth daily. Takes prenatal because there are no dyes in it   Probiotic Product (ALIGN PO) Take by mouth 2 (two) times daily.   rifaximin (XIFAXAN) 550 MG TABS tablet Take 550 mg by mouth See admin  instructions. Take one tablet by mouth three times daily for 14 days when needed.   rosuvastatin (CRESTOR) 10 MG tablet Take  10 mg by mouth every evening.    Saccharomyces boulardii (FLORASTOR PO) Take 1 capsule by mouth daily.   Semaglutide (RYBELSUS) 14 MG TABS Take 1 tablet by mouth daily.   Tbo-Filgrastim (GRANIX) 480 MCG/0.8ML SOSY injection 480 mcg subcutaneous injection every 6 days life long   [DISCONTINUED] aspirin EC 81 MG tablet Take 1 tablet (81 mg total) by mouth every Monday, Wednesday, and Friday. Swallow whole.     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, sports - retired  Tobacco Use   Smoking status: Never   Smokeless tobacco: Never  Vaping Use   Vaping Use: Never used  Substance and Sexual Activity   Alcohol use: Not Currently   Drug use: No   Sexual activity: Not on file  Other Topics Concern   Not on file  Social History Narrative   Exercise-- has not gotten back into it since cancer came back   Social Determinants of Health   Financial Resource Strain: Not on file  Food Insecurity: Not on file  Transportation Needs: Not on file  Physical Activity: Not on file  Stress: Not on file  Social Connections: Not on file     Family History: The patient's family history includes Brain cancer in her paternal grandfather; Breast cancer in her maternal aunt; Cancer (age of onset: 60) in her mother; Cancer (age of onset: 22) in her father; Diabetes in her brother, father, and sister; Heart disease in her brother and  father; Hyperlipidemia in her father; Hypertension in her father and mother; Hypertension (age of onset: y-10) in her brother; Lymphoma in her paternal aunt; Obesity in her father; Ovarian cancer in an other family member.  ROS:   Please see the history of present illness.    She does have parastomal bruising and bleeding from time to time due to a fistula.  No heavy bleeding.  All other systems reviewed and are negative.  EKGs/Labs/Other Studies Reviewed:    The following studies were reviewed today: Cardiac monitor performed June 2021:  Study Highlights  Basic rhythm is normal sinus rhythm Paroxysmal atrial fibrillation is noted accounting for less than 1% of heart rhythm and a total of 56 minutes during the 1 month monitor. First-degree AV block and PACs are noted    Echocardiogram May 2021: IMPRESSIONS     1. Left ventricular ejection fraction, by estimation, is 55 to 60%. The  left ventricle has normal function. The left ventricle has no regional  wall motion abnormalities. There is mild left ventricular hypertrophy.  Left ventricular diastolic parameters  are indeterminate.   2. Right ventricular systolic function is normal. The right ventricular  size is normal.   3. The mitral valve is grossly normal. Trivial mitral valve  regurgitation.   4. The aortic valve is tricuspid. Aortic valve regurgitation is not  visualized.   5. The inferior vena cava is normal in size with greater than 50%  respiratory variability, suggesting right atrial pressure of 3 mmHg.   EKG:  EKG sinus rhythm.  PR interval 180 ms.  Normal EKG.  No  change when compared to April 2021.  Recent Labs: 06/16/2020: ALT 33; BUN 32; Creatinine, Ser 1.64; Potassium 4.0; Sodium 144 09/30/2020: Hemoglobin 12.0; Platelets 195  Recent Lipid Panel    Component Value Date/Time   CHOL 92 (L) 06/16/2020 0834   TRIG 128 06/16/2020 0834   HDL 49 06/16/2020 0834   CHOLHDL 1.9 01/18/2020 1440   VLDL 19.8 08/28/2015  1617   LDLCALC 21 06/16/2020 0834   LDLCALC 13 01/18/2020 1440    Physical Exam:    VS:  BP 126/68   Pulse 63   Ht _0  (1.549 m)   Wt 210 lb 6.4 oz (95.4 kg)   SpO2 98%   BMI 39.75 kg/m     Wt Readings from Last 3 Encounters:  01/29/21 210 lb 6.4 oz (95.4 kg)  01/20/21 209 lb 6.4 oz (95 kg)  01/12/21 207 lb (93.9 kg)     GEN: Obese. No acute distress HEENT: Normal NECK: No JVD. LYMPHATICS: No lymphadenopathy CARDIAC: No murmur. RRR no gallop, or edema. VASCULAR: No normal Pulses. No bruits. RESPIRATORY:  Clear to auscultation without rales, wheezing or rhonchi  ABDOMEN: Soft, non-tender, non-distended, No pulsatile mass, MUSCULOSKELETAL: No deformity  SKIN: Warm and dry NEUROLOGIC:  Alert and oriented x 3 PSYCHIATRIC:  Normal affect   ASSESSMENT:    1. Paroxysmal atrial fibrillation (HCC)   2. DOE (dyspnea on exertion)   3. Benign essential HTN   4. Hyperlipidemia, unspecified hyperlipidemia type   5. Stage 3 chronic kidney disease, unspecified whether stage 3a or 3b CKD ([place])   6. Type 2 diabetes mellitus with stage 4 chronic kidney disease, without long-term current use of insulin (HCC)   7. Class 2 severe obesity with serious comorbidity and body mass index (BMI) of 36.0 to 36.9 in adult, unspecified obesity type ([place])   8. First degree AV block    PLAN:    In order of problems listed above:  Chads Vascular is greater than 2.  Last year we documented a burden of atrial fibrillation for approximately 60 minutes within a 30-day timeframe.  She relays to me that from time to time she will have irregular rapid heart beating that can last for hours.  Most recent episode 2 weeks ago.  We will start apixaban 2.5 mg twice daily.  Follow-up in the pharmacy clinic in 1 month to assure that dosing is correct.  BUN, creatinine, and hemoglobin will be obtained prior to our at that office visit in 1 month. Not a current complaint BP is controlled Her lipids are not an  issue Followed by Dr. Hollie Salk.   Will notify all of her physicians of the decision to start anticoagulation.  I am stopping aspirin.  If there are concerns hopefully I will hear from them.  Her calculated yearly stroke risk not on anticoagulation therapy is 3-1/2 to 4%.  10-year stroke risk not anticoagulated is 35 to 40%.  Pros and cons of anticoagulation as well as precautions were discussed in detail with the patient.  Precautions especially around head trauma, development of headache, etc.   Medication Adjustments/Labs and Tests Ordered: Current medicines are reviewed at length with the patient today.  Concerns regarding medicines are outlined above.  No orders of the defined types were placed in this encounter.  No orders of the defined types were placed in this encounter.   There are no Patient Instructions on file for this visit.   Signed, Sinclair Grooms, MD  01/29/2021 8:30 AM  [facility]

## 2021-01-29 ENCOUNTER — Other Ambulatory Visit: Payer: Self-pay

## 2021-01-29 ENCOUNTER — Encounter: Payer: Self-pay | Admitting: Interventional Cardiology

## 2021-01-29 ENCOUNTER — Ambulatory Visit (INDEPENDENT_AMBULATORY_CARE_PROVIDER_SITE_OTHER): Payer: BC Managed Care – PPO | Admitting: Interventional Cardiology

## 2021-01-29 VITALS — BP 126/68 | HR 63 | Ht 61.0 in | Wt 210.4 lb

## 2021-01-29 DIAGNOSIS — E785 Hyperlipidemia, unspecified: Secondary | ICD-10-CM | POA: Diagnosis not present

## 2021-01-29 DIAGNOSIS — I480 Paroxysmal atrial fibrillation: Secondary | ICD-10-CM | POA: Diagnosis not present

## 2021-01-29 DIAGNOSIS — I10 Essential (primary) hypertension: Secondary | ICD-10-CM | POA: Diagnosis not present

## 2021-01-29 DIAGNOSIS — Z933 Colostomy status: Secondary | ICD-10-CM | POA: Diagnosis not present

## 2021-01-29 DIAGNOSIS — N184 Chronic kidney disease, stage 4 (severe): Secondary | ICD-10-CM

## 2021-01-29 DIAGNOSIS — R0600 Dyspnea, unspecified: Secondary | ICD-10-CM | POA: Diagnosis not present

## 2021-01-29 DIAGNOSIS — Z6836 Body mass index (BMI) 36.0-36.9, adult: Secondary | ICD-10-CM

## 2021-01-29 DIAGNOSIS — R0609 Other forms of dyspnea: Secondary | ICD-10-CM

## 2021-01-29 DIAGNOSIS — E1122 Type 2 diabetes mellitus with diabetic chronic kidney disease: Secondary | ICD-10-CM

## 2021-01-29 DIAGNOSIS — N183 Chronic kidney disease, stage 3 unspecified: Secondary | ICD-10-CM

## 2021-01-29 DIAGNOSIS — I440 Atrioventricular block, first degree: Secondary | ICD-10-CM

## 2021-01-29 MED ORDER — APIXABAN 2.5 MG PO TABS: 2.5000 mg | ORAL_TABLET | Freq: Two times a day (BID) | ORAL | 11 refills | Status: DC

## 2021-01-29 NOTE — Patient Instructions (Signed)
Medication Instructions:  1) DISCONTINUE Aspirin 2) START Eliquis 2.5mg  twice daily  *If you need a refill on your cardiac medications before your next appointment, please call your pharmacy*   Lab Work: BMET and CBC in 1 month  If you have labs (blood work) drawn today and your tests are completely normal, you will receive your results only by: [place] (if you have MyChart) OR A paper copy in the mail If you have any lab test that is abnormal or we need to change your treatment, we will call you to review the results.   Testing/Procedures: None   Follow-Up:  Your physician recommends that you schedule a follow-up appointment in: 1 month with the pharmacy team to make sure you are on proper dosing of Eliquis.  At [facility], you and your health needs are our priority.  As part of our continuing mission to provide you with exceptional heart care, we have created designated Provider Care Teams.  These Care Teams include your primary Cardiologist (physician) and Advanced Practice Providers (APPs -  Physician Assistants and Nurse Practitioners) who all work together to provide you with the care you need, when you need it.  We recommend signing up for the patient portal called "MyChart".  Sign up information is provided on this After Visit Summary.  MyChart is used to connect with patients for Virtual Visits (Telemedicine).  Patients are able to view lab/test results, encounter notes, upcoming appointments, etc.  Non-urgent messages can be sent to your provider as well.   To learn more about what you can do with MyChart, go to [email or web address].    Your next appointment:   6 month(s)  The format for your next appointment:   In Person  Provider:   You may see Sinclair Grooms, MD or one of the following Advanced Practice Providers on your designated Care Team:   Kathyrn Drown, NP   Other Instructions

## 2021-02-02 ENCOUNTER — Encounter: Payer: Self-pay | Admitting: Dermatology

## 2021-02-09 ENCOUNTER — Ambulatory Visit (INDEPENDENT_AMBULATORY_CARE_PROVIDER_SITE_OTHER): Payer: BC Managed Care – PPO | Admitting: Family Medicine

## 2021-02-09 ENCOUNTER — Other Ambulatory Visit: Payer: Self-pay

## 2021-02-09 ENCOUNTER — Encounter (INDEPENDENT_AMBULATORY_CARE_PROVIDER_SITE_OTHER): Payer: Self-pay | Admitting: Family Medicine

## 2021-02-09 VITALS — BP 113/72 | HR 67 | Temp 97.6°F | Ht 61.0 in | Wt 210.0 lb

## 2021-02-09 DIAGNOSIS — F3289 Other specified depressive episodes: Secondary | ICD-10-CM

## 2021-02-09 DIAGNOSIS — Z6838 Body mass index (BMI) 38.0-38.9, adult: Secondary | ICD-10-CM

## 2021-02-09 MED ORDER — BUPROPION HCL ER (SR) 150 MG PO TB12: 150.0000 mg | ORAL_TABLET | Freq: Two times a day (BID) | ORAL | 0 refills | Status: DC

## 2021-02-10 DIAGNOSIS — Z933 Colostomy status: Secondary | ICD-10-CM | POA: Diagnosis not present

## 2021-02-10 DIAGNOSIS — L89319 Pressure ulcer of right buttock, unspecified stage: Secondary | ICD-10-CM | POA: Diagnosis not present

## 2021-02-16 DIAGNOSIS — Z933 Colostomy status: Secondary | ICD-10-CM | POA: Diagnosis not present

## 2021-02-17 NOTE — Progress Notes (Signed)
Chief Complaint:   OBESITY [name] is here to discuss her progress with her obesity treatment plan along with follow-up of her obesity related diagnoses. [name] is on keeping a food journal and adhering to recommended goals of 1200-1300 calories and 75+ grams of protein daily and states she is following her eating plan approximately 60% of the time. [name] states she is doing 0 minutes 0 times per week.  Today's visit was #: 59 Starting weight: 208 lbs Starting date: 04/05/2018 Today's weight: 210 lbs Today's date: 02/09/2021 Total lbs lost to date: 0 Total lbs lost since last in-office visit: 0  Interim History: [name] has been trying to be mindful of her food choices. She is frustrated with her on going health issues which makes exercising especially difficult. She has been feeling more overwhelmed and this has decreased motivation to journal.  Subjective:   1. Other depression, with emotional eating [name] is on Wellbutrin but she has been feeling especially stressed with her recent health issues. She appears to be feeling overwhelmed and doing more comfort eating.  Assessment/Plan:   1. Other depression, with emotional eating Behavior modification techniques were discussed today to help [name] deal with her emotional/non-hunger eating behaviors. [name] agreed to [employer] Wellbutrin SR to 150 mg BID and we will refill for 90 days with no refills. Orders and follow up as documented in patient record.   - buPROPion (WELLBUTRIN SR) 150 MG 12 hr tablet; Take 1 tablet (150 mg total) by mouth 2 (two) times daily.  Dispense: 180 tablet; Refill: 0  2. Obesity with current BMI 39.8 [name] is currently in the action stage of change. As such, her goal is to continue with weight loss efforts. She has agreed to keeping a food journal and adhering to recommended goals of 1200-1300 calories and 75+ grams of protein daily.   Behavioral modification strategies: emotional eating strategies.  [name] has  agreed to follow-up with our clinic in 3 to 4 weeks. She was informed of the importance of frequent follow-up visits to maximize her success with intensive lifestyle modifications for her multiple health conditions.   Objective:   Blood pressure 113/72, pulse 67, temperature 97.6 F (36.4 C), height 5\' 1"  (1.549 m), weight 210 lb (95.3 kg), SpO2 96 %. Body mass index is 39.68 kg/m.  General: Cooperative, alert, well developed, in no acute distress. HEENT: Conjunctivae and lids unremarkable. Cardiovascular: Regular rhythm.  Lungs: Normal work of breathing. Neurologic: No focal deficits.   Lab Results  Component Value Date   CREATININE 1.64 (H) 06/16/2020   BUN 32 (H) 06/16/2020   NA 144 06/16/2020   K 4.0 06/16/2020   CL 104 06/16/2020   CO2 22 06/16/2020   Lab Results  Component Value Date   ALT 33 (H) 06/16/2020   AST 25 06/16/2020   ALKPHOS 175 (H) 06/16/2020   BILITOT 0.3 06/16/2020   Lab Results  Component Value Date   HGBA1C 6.3 (H) 06/16/2020   HGBA1C 7.9 (H) 09/01/2017   HGBA1C 5.3 04/01/2017   HGBA1C 5.8 (H) 09/05/2015   HGBA1C 6.8 (H) 11/21/2014   Lab Results  Component Value Date   INSULIN 14.1 06/16/2020   Lab Results  Component Value Date   TSH 3.38 01/18/2020   Lab Results  Component Value Date   CHOL 92 (L) 06/16/2020   HDL 49 06/16/2020   LDLCALC 21 06/16/2020   TRIG 128 06/16/2020   CHOLHDL 1.9 01/18/2020   Lab Results  Component Value Date  VD25OH 25.9 (L) 06/16/2020   VD25OH 40.8 04/05/2018   Lab Results  Component Value Date   WBC 6.5 09/30/2020   HGB 12.0 09/30/2020   HCT 37.0 09/30/2020   MCV 95.6 09/30/2020   PLT 195 09/30/2020   Lab Results  Component Value Date   IRON 7 (L) 08/31/2017   TIBC 164 (L) 08/31/2017   FERRITIN 27 11/29/2014   Attestation Statements:   Reviewed by clinician on day of visit: allergies, medications, problem list, medical history, surgical history, family history, social history, and previous  encounter notes.  Time spent on visit including pre-visit chart review and post-visit care and charting was 30 minutes.    I, Trixie Dredge, am acting as transcriptionist for Dennard Nip, MD.  I have reviewed the above documentation for accuracy and completeness, and I agree with the above. -  Dennard Nip, MD

## 2021-02-19 ENCOUNTER — Telehealth: Payer: Self-pay | Admitting: Pharmacist

## 2021-02-19 NOTE — Telephone Encounter (Signed)
Left message for pt to call back. Calling to cancel pt apt on 8/3. Pt still needs to come in for labs 7/28. However, apt was just to review that she was on the correct dose. Pt does not need to come to the office for this. We will review labs and call pt.

## 2021-02-19 NOTE — Telephone Encounter (Signed)
Patient called back. She is ok with just discussing labs over the phone. I have canceled her apt for 8/3. She will come for labs 7/28.

## 2021-02-26 ENCOUNTER — Other Ambulatory Visit: Payer: Self-pay

## 2021-02-26 ENCOUNTER — Other Ambulatory Visit: Payer: BC Managed Care – PPO | Admitting: *Deleted

## 2021-02-26 DIAGNOSIS — I480 Paroxysmal atrial fibrillation: Secondary | ICD-10-CM | POA: Diagnosis not present

## 2021-02-27 LAB — BASIC METABOLIC PANEL
BUN/Creatinine Ratio: 11 — ABNORMAL LOW (ref 12–28)
BUN: 21 mg/dL (ref 8–27)
CO2: 23 mmol/L (ref 20–29)
Calcium: 9.3 mg/dL (ref 8.7–10.3)
Chloride: 106 mmol/L (ref 96–106)
Creatinine, Ser: 1.86 mg/dL — ABNORMAL HIGH (ref 0.57–1.00)
Glucose: 105 mg/dL — ABNORMAL HIGH (ref 65–99)
Potassium: 4.4 mmol/L (ref 3.5–5.2)
Sodium: 144 mmol/L (ref 134–144)
eGFR: 30 mL/min/{1.73_m2} — ABNORMAL LOW (ref >59)

## 2021-02-27 LAB — CBC
Hematocrit: 37.3 % (ref 34.0–46.6)
Hemoglobin: 11.9 g/dL (ref 11.1–15.9)
MCH: 30.3 pg (ref 26.6–33.0)
MCHC: 31.9 g/dL (ref 31.5–35.7)
MCV: 95 fL (ref 79–97)
Platelets: 218 10*3/uL (ref 150–450)
RBC: 3.93 x10E6/uL (ref 3.77–5.28)
RDW: 13 % (ref 11.7–15.4)
WBC: 5.4 10*3/uL (ref 3.4–10.8)

## 2021-03-02 ENCOUNTER — Other Ambulatory Visit: Payer: Self-pay

## 2021-03-02 ENCOUNTER — Ambulatory Visit (INDEPENDENT_AMBULATORY_CARE_PROVIDER_SITE_OTHER): Payer: BC Managed Care – PPO | Admitting: Family Medicine

## 2021-03-02 ENCOUNTER — Encounter (INDEPENDENT_AMBULATORY_CARE_PROVIDER_SITE_OTHER): Payer: Self-pay | Admitting: Family Medicine

## 2021-03-02 VITALS — BP 109/73 | HR 72 | Temp 97.7°F | Ht 62.0 in | Wt 211.0 lb

## 2021-03-02 DIAGNOSIS — L89319 Pressure ulcer of right buttock, unspecified stage: Secondary | ICD-10-CM | POA: Diagnosis not present

## 2021-03-02 DIAGNOSIS — Z9189 Other specified personal risk factors, not elsewhere classified: Secondary | ICD-10-CM

## 2021-03-02 DIAGNOSIS — F3289 Other specified depressive episodes: Secondary | ICD-10-CM

## 2021-03-02 DIAGNOSIS — E1169 Type 2 diabetes mellitus with other specified complication: Secondary | ICD-10-CM

## 2021-03-02 DIAGNOSIS — Z936 Other artificial openings of urinary tract status: Secondary | ICD-10-CM | POA: Diagnosis not present

## 2021-03-02 DIAGNOSIS — Z6838 Body mass index (BMI) 38.0-38.9, adult: Secondary | ICD-10-CM | POA: Diagnosis not present

## 2021-03-02 DIAGNOSIS — Z933 Colostomy status: Secondary | ICD-10-CM | POA: Diagnosis not present

## 2021-03-02 DIAGNOSIS — Z4801 Encounter for change or removal of surgical wound dressing: Secondary | ICD-10-CM | POA: Diagnosis not present

## 2021-03-02 MED ORDER — OZEMPIC (0.25 OR 0.5 MG/DOSE) 2 MG/1.5ML ~~LOC~~ SOPN: 0.5000 mg | PEN_INJECTOR | SUBCUTANEOUS | 0 refills | Status: DC

## 2021-03-02 MED ORDER — BUPROPION HCL ER (SR) 150 MG PO TB12
150.0000 mg | ORAL_TABLET | Freq: Two times a day (BID) | ORAL | 0 refills | Status: DC
Start: 2021-03-02 — End: 2021-03-03

## 2021-03-03 ENCOUNTER — Encounter (INDEPENDENT_AMBULATORY_CARE_PROVIDER_SITE_OTHER): Payer: Self-pay | Admitting: Family Medicine

## 2021-03-03 ENCOUNTER — Other Ambulatory Visit (INDEPENDENT_AMBULATORY_CARE_PROVIDER_SITE_OTHER): Payer: Self-pay | Admitting: Emergency Medicine

## 2021-03-03 DIAGNOSIS — E1169 Type 2 diabetes mellitus with other specified complication: Secondary | ICD-10-CM

## 2021-03-03 DIAGNOSIS — F3289 Other specified depressive episodes: Secondary | ICD-10-CM

## 2021-03-03 MED ORDER — APIXABAN 5 MG PO TABS: 5.0000 mg | ORAL_TABLET | Freq: Two times a day (BID) | ORAL | 1 refills | Status: DC

## 2021-03-03 MED ORDER — OZEMPIC (0.25 OR 0.5 MG/DOSE) 2 MG/1.5ML ~~LOC~~ SOPN: 0.5000 mg | PEN_INJECTOR | SUBCUTANEOUS | 0 refills | Status: DC

## 2021-03-03 MED ORDER — BUPROPION HCL ER (SR) 150 MG PO TB12: 150.0000 mg | ORAL_TABLET | Freq: Two times a day (BID) | ORAL | 0 refills | Status: DC

## 2021-03-03 NOTE — Telephone Encounter (Signed)
Spoke with patient. She is ok with increasing Eliquis dose to 5mg  BID which is the correct dose for her age and weight. Although her scr is >1.5, she is <80 and >60kg. 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  She was advised she could take 2 of the 2.5mg  tablets twice a day, until she runs out.

## 2021-03-03 NOTE — Telephone Encounter (Signed)
Reviewed patients labs. Based off of her scr of 1.86, age 65 and weight of 95.7kg patient should be on Eliquis 5mg  BID. Currently on 2.5mg  BID Called pt and LVM for her to call back. Need to discuss increasing eliquis to 5mg  BID.

## 2021-03-03 NOTE — Addendum Note (Signed)
Addended by: Marcelle Overlie D on: 03/03/2021 03:04 PM   Modules accepted: Orders

## 2021-03-03 NOTE — Telephone Encounter (Signed)
Spoken with patient and  bupropion went to [facility] and Ozempic went to local [facility]

## 2021-03-04 ENCOUNTER — Other Ambulatory Visit (INDEPENDENT_AMBULATORY_CARE_PROVIDER_SITE_OTHER): Payer: Self-pay | Admitting: Emergency Medicine

## 2021-03-04 ENCOUNTER — Ambulatory Visit: Payer: BC Managed Care – PPO

## 2021-03-04 ENCOUNTER — Encounter (INDEPENDENT_AMBULATORY_CARE_PROVIDER_SITE_OTHER): Payer: Self-pay

## 2021-03-04 DIAGNOSIS — F3289 Other specified depressive episodes: Secondary | ICD-10-CM

## 2021-03-04 MED ORDER — BUPROPION HCL ER (SR) 150 MG PO TB12: 150.0000 mg | ORAL_TABLET | Freq: Two times a day (BID) | ORAL | 0 refills | Status: DC

## 2021-03-04 NOTE — Progress Notes (Signed)
Chief Complaint:   OBESITY [name] is here to discuss her progress with her obesity treatment plan along with follow-up of her obesity related diagnoses. [name] is on keeping a food journal and adhering to recommended goals of 1200-1300 calories and 75+ grams of protein daily and states she is following her eating plan approximately (unknown)% of the time. [name] states she is walking for 40 minutes 2 times per week.  Today's visit was #: 66 Starting weight: 208 lbs Starting date: 04/05/2018 Today's weight: 211 lbs Today's date: 03/02/2021 Total lbs lost to date: 0 Total lbs lost since last in-office visit: 0  Interim History: [name] has had a lot of stress recently and was unable to increase Wellbutrin as we dicussed. She noted increased emotional eating behaviors. She is going on vacation soon.  Subjective:   1. Type 2 diabetes mellitus with other specified complication, without long-term current use of insulin ([place]) [name] was given the options to change GLP-1 medications and has decided she would like to try this.  2. Other depression, with emotional eating [name] was unable to take Wellbutrin due to a pharmacy error.  3. At risk for dehydration [name] is at risk for dehydration due to inadequate water intake.  Assessment/Plan:   1. Type 2 diabetes mellitus with other specified complication, without long-term current use of insulin ([place]) [name] agreed to discontinue Rybelsus and start Ozempic 0.5 mg q weekly with no refills. Good blood sugar control is important to decrease the likelihood of diabetic complications such as nephropathy, neuropathy, limb loss, blindness, coronary artery disease, and death. Intensive lifestyle modification including diet, exercise and weight loss are the first line of treatment for diabetes.   2. Other depression, with emotional eating Behavior modification techniques were discussed today to help [name] deal with her emotional/non-hunger eating behaviors.  We will refill Wellbutrin SR 150 mg BID #60 with no refills. Orders and follow up as documented in patient record.   3. At risk for dehydration [name] was given approximately 15 minutes dehydration prevention counseling today. [name] is at risk for dehydration due to weight loss and current medication(s). She was encouraged to hydrate and monitor fluid status to avoid dehydration as well as weight loss plateaus.   4. Obesity with current BMI 38.7 [name] is currently in the action stage of change. As such, her goal is to continue with weight loss efforts. She has agreed to keeping a food journal and adhering to recommended goals of 1200-1300 calories and 75+ grams of protein daily.   [name] is to increase Wellbutrin to 150 mg q AM and 150 mg q PM, and she will continue with journaling.  Exercise goals: As is.  Behavioral modification strategies: increasing lean protein intake, increasing water intake, emotional eating strategies, and travel eating strategies.  [name] has agreed to follow-up with our clinic in 3 to 4 weeks. She was informed of the importance of frequent follow-up visits to maximize her success with intensive lifestyle modifications for her multiple health conditions.   Objective:   Blood pressure 109/73, pulse 72, temperature 97.7 F (36.5 C), height 5\' 2"  (1.575 m), weight 211 lb (95.7 kg), SpO2 98 %. Body mass index is 38.59 kg/m.  General: Cooperative, alert, well developed, in no acute distress. HEENT: Conjunctivae and lids unremarkable. Cardiovascular: Regular rhythm.  Lungs: Normal work of breathing. Neurologic: No focal deficits.   Lab Results  Component Value Date   CREATININE 1.86 (H) 02/26/2021   BUN 21 02/26/2021   NA  144 02/26/2021   K 4.4 02/26/2021   CL 106 02/26/2021   CO2 23 02/26/2021   Lab Results  Component Value Date   ALT 33 (H) 06/16/2020   AST 25 06/16/2020   ALKPHOS 175 (H) 06/16/2020   BILITOT 0.3 06/16/2020   Lab Results  Component  Value Date   HGBA1C 6.3 (H) 06/16/2020   HGBA1C 7.9 (H) 09/01/2017   HGBA1C 5.3 04/01/2017   HGBA1C 5.8 (H) 09/05/2015   HGBA1C 6.8 (H) 11/21/2014   Lab Results  Component Value Date   INSULIN 14.1 06/16/2020   Lab Results  Component Value Date   TSH 3.38 01/18/2020   Lab Results  Component Value Date   CHOL 92 (L) 06/16/2020   HDL 49 06/16/2020   LDLCALC 21 06/16/2020   TRIG 128 06/16/2020   CHOLHDL 1.9 01/18/2020   Lab Results  Component Value Date   VD25OH 25.9 (L) 06/16/2020   VD25OH 40.8 04/05/2018   Lab Results  Component Value Date   WBC 5.4 02/26/2021   HGB 11.9 02/26/2021   HCT 37.3 02/26/2021   MCV 95 02/26/2021   PLT 218 02/26/2021   Lab Results  Component Value Date   IRON 7 (L) 08/31/2017   TIBC 164 (L) 08/31/2017   FERRITIN 27 11/29/2014   Attestation Statements:   Reviewed by clinician on day of visit: allergies, medications, problem list, medical history, surgical history, family history, social history, and previous encounter notes.   I, Trixie Dredge, am acting as transcriptionist for Dennard Nip, MD.  I have reviewed the above documentation for accuracy and completeness, and I agree with the above. -  Dennard Nip, MD

## 2021-03-04 NOTE — Telephone Encounter (Signed)
Ozempic was approved and patient was sent a [employer]. Sounds like she is having issues where RX needs to be sent.

## 2021-03-06 ENCOUNTER — Encounter: Payer: Self-pay | Admitting: Genetic Counselor

## 2021-03-19 ENCOUNTER — Ambulatory Visit: Payer: BC Managed Care – PPO | Admitting: Interventional Cardiology

## 2021-03-23 DIAGNOSIS — Z4801 Encounter for change or removal of surgical wound dressing: Secondary | ICD-10-CM | POA: Diagnosis not present

## 2021-03-23 DIAGNOSIS — Z936 Other artificial openings of urinary tract status: Secondary | ICD-10-CM | POA: Diagnosis not present

## 2021-03-23 DIAGNOSIS — Z933 Colostomy status: Secondary | ICD-10-CM | POA: Diagnosis not present

## 2021-03-23 DIAGNOSIS — L89319 Pressure ulcer of right buttock, unspecified stage: Secondary | ICD-10-CM | POA: Diagnosis not present

## 2021-03-24 ENCOUNTER — Other Ambulatory Visit: Payer: Self-pay

## 2021-03-24 ENCOUNTER — Ambulatory Visit (INDEPENDENT_AMBULATORY_CARE_PROVIDER_SITE_OTHER): Payer: BC Managed Care – PPO

## 2021-03-24 DIAGNOSIS — Z23 Encounter for immunization: Secondary | ICD-10-CM

## 2021-03-24 NOTE — Progress Notes (Signed)
Patient here today for second shingrix. 0.67mL given in left deltoid IM. Patient tolerated well. VIS given. [facility] updated.

## 2021-03-27 DIAGNOSIS — H2513 Age-related nuclear cataract, bilateral: Secondary | ICD-10-CM | POA: Diagnosis not present

## 2021-03-27 DIAGNOSIS — H524 Presbyopia: Secondary | ICD-10-CM | POA: Diagnosis not present

## 2021-03-27 DIAGNOSIS — Z936 Other artificial openings of urinary tract status: Secondary | ICD-10-CM | POA: Diagnosis not present

## 2021-03-27 DIAGNOSIS — E119 Type 2 diabetes mellitus without complications: Secondary | ICD-10-CM | POA: Diagnosis not present

## 2021-03-27 DIAGNOSIS — H04223 Epiphora due to insufficient drainage, bilateral lacrimal glands: Secondary | ICD-10-CM | POA: Diagnosis not present

## 2021-03-27 DIAGNOSIS — L89319 Pressure ulcer of right buttock, unspecified stage: Secondary | ICD-10-CM | POA: Diagnosis not present

## 2021-03-27 DIAGNOSIS — Z933 Colostomy status: Secondary | ICD-10-CM | POA: Diagnosis not present

## 2021-03-27 DIAGNOSIS — Z4801 Encounter for change or removal of surgical wound dressing: Secondary | ICD-10-CM | POA: Diagnosis not present

## 2021-03-27 LAB — HM DIABETES EYE EXAM

## 2021-03-30 ENCOUNTER — Encounter (INDEPENDENT_AMBULATORY_CARE_PROVIDER_SITE_OTHER): Payer: Self-pay | Admitting: Family Medicine

## 2021-03-30 ENCOUNTER — Ambulatory Visit (INDEPENDENT_AMBULATORY_CARE_PROVIDER_SITE_OTHER): Payer: BC Managed Care – PPO | Admitting: Family Medicine

## 2021-03-30 ENCOUNTER — Other Ambulatory Visit: Payer: Self-pay

## 2021-03-30 VITALS — BP 128/80 | HR 74 | Temp 97.7°F | Ht 62.0 in | Wt 210.0 lb

## 2021-03-30 DIAGNOSIS — Z6838 Body mass index (BMI) 38.0-38.9, adult: Secondary | ICD-10-CM

## 2021-03-30 DIAGNOSIS — E1169 Type 2 diabetes mellitus with other specified complication: Secondary | ICD-10-CM

## 2021-03-30 DIAGNOSIS — F3289 Other specified depressive episodes: Secondary | ICD-10-CM | POA: Diagnosis not present

## 2021-03-30 MED ORDER — BUPROPION HCL ER (SR) 150 MG PO TB12: 150.0000 mg | ORAL_TABLET | Freq: Two times a day (BID) | ORAL | 0 refills | Status: DC

## 2021-03-30 MED ORDER — OZEMPIC (0.25 OR 0.5 MG/DOSE) 2 MG/1.5ML ~~LOC~~ SOPN: 0.5000 mg | PEN_INJECTOR | SUBCUTANEOUS | 0 refills | Status: DC

## 2021-03-31 NOTE — Progress Notes (Signed)
Chief Complaint:   OBESITY [name] is here to discuss her progress with her obesity treatment plan along with follow-up of her obesity related diagnoses. [name] is on keeping a food journal and adhering to recommended goals of 1200-1300 calories and 75+ grams of protein daily and states she is following her eating plan approximately 25% of the time. [name] states she is doing 0 minutes 0 times per week.  Today's visit was #: 31 Starting weight: 208 lbs Starting date: 04/05/2018 Today's weight: 210 lbs Today's date: 03/30/2021 Total lbs lost to date: 0 Total lbs lost since last in-office visit: 1  Interim History: [name] was on vacation at the beach, and when she got back she developed nausea and vomiting and self diagnosed a bowel obstruction which she is very experienced with. She was NPO (nothing by mouth) for 1 day and then the pain and vomiting resolved. She is now able to eat her regular diet and ready to get BOT with her weight loss efforts.  Subjective:   1. Type 2 diabetes mellitus with other specified complication, without long-term current use of insulin ([facility]) [name]'s last A1c was 6.3, and she is working on diet and exercise. She denies signs of hypoglycemia  2. Other depression, with emotional eating [name] has had extra stressors with health issues and renovation construction in her home.  Assessment/Plan:   1. Type 2 diabetes mellitus with other specified complication, without long-term current use of insulin (HCC) [name] will continue her medications, and we will refill Ozempic for 1 month. Good blood sugar control is important to decrease the likelihood of diabetic complications such as nephropathy, neuropathy, limb loss, blindness, coronary artery disease, and death. Intensive lifestyle modification including diet, exercise and weight loss are the first line of treatment for diabetes.   - Semaglutide,0.25 or 0.5MG /DOS, (OZEMPIC, 0.25 OR 0.5 MG/DOSE,) 2 MG/1.5ML SOPN; Inject  0.5 mg into the skin once a week.  Dispense: 1.5 mL; Refill: 0  2. Other depression, with emotional eating Behavior modification techniques were discussed today to help [name] deal with her emotional/non-hunger eating behaviors. We will refill Wellbutrin SR for 90 days with no refills. Orders and follow up as documented in patient record.   - buPROPion (WELLBUTRIN SR) 150 MG 12 hr tablet; Take 1 tablet (150 mg total) by mouth 2 (two) times daily.  Dispense: 180 tablet; Refill: 0  3. Obesity with current BMI 38.4 [name] is currently in the action stage of change. As such, her goal is to continue with weight loss efforts. She has agreed to keeping a food journal and adhering to recommended goals of 1200-1300 calories and 75+ grams of protein daily.   Behavioral modification strategies: increasing water intake.  [name] has agreed to follow-up with our clinic in 3 to 4 weeks. She was informed of the importance of frequent follow-up visits to maximize her success with intensive lifestyle modifications for her multiple health conditions.   Objective:   Blood pressure 128/80, pulse 74, temperature 97.7 F (36.5 C), height 5\' 2"  (1.575 m), weight 210 lb (95.3 kg), SpO2 98 %. Body mass index is 38.41 kg/m.  General: Cooperative, alert, well developed, in no acute distress. HEENT: Conjunctivae and lids unremarkable. Cardiovascular: Regular rhythm.  Lungs: Normal work of breathing. Neurologic: No focal deficits.   Lab Results  Component Value Date   CREATININE 1.86 (H) 02/26/2021   BUN 21 02/26/2021   NA 144 02/26/2021   K 4.4 02/26/2021   CL 106 02/26/2021  CO2 23 02/26/2021   Lab Results  Component Value Date   ALT 33 (H) 06/16/2020   AST 25 06/16/2020   ALKPHOS 175 (H) 06/16/2020   BILITOT 0.3 06/16/2020   Lab Results  Component Value Date   HGBA1C 6.3 (H) 06/16/2020   HGBA1C 7.9 (H) 09/01/2017   HGBA1C 5.3 04/01/2017   HGBA1C 5.8 (H) 09/05/2015   HGBA1C 6.8 (H) 11/21/2014    Lab Results  Component Value Date   INSULIN 14.1 06/16/2020   Lab Results  Component Value Date   TSH 3.38 01/18/2020   Lab Results  Component Value Date   CHOL 92 (L) 06/16/2020   HDL 49 06/16/2020   LDLCALC 21 06/16/2020   TRIG 128 06/16/2020   CHOLHDL 1.9 01/18/2020   Lab Results  Component Value Date   VD25OH 25.9 (L) 06/16/2020   VD25OH 40.8 04/05/2018   Lab Results  Component Value Date   WBC 5.4 02/26/2021   HGB 11.9 02/26/2021   HCT 37.3 02/26/2021   MCV 95 02/26/2021   PLT 218 02/26/2021   Lab Results  Component Value Date   IRON 7 (L) 08/31/2017   TIBC 164 (L) 08/31/2017   FERRITIN 27 11/29/2014    Obesity Behavioral Intervention:   Approximately 15 minutes were spent on the discussion below.  ASK: We discussed the diagnosis of obesity with [name] today and [name] agreed to give [place] permission to discuss obesity behavioral modification therapy today.  ASSESS: [name] has the diagnosis of obesity and her BMI today is 38.4. [name] is in the action stage of change.   ADVISE: [name] was educated on the multiple health risks of obesity as well as the benefit of weight loss to improve her health. She was advised of the need for long term treatment and the importance of lifestyle modifications to improve her current health and to decrease her risk of future health problems.  AGREE: Multiple dietary modification options and treatment options were discussed and [name] agreed to follow the recommendations documented in the above note.  ARRANGE: [name] was educated on the importance of frequent visits to treat obesity as outlined per CMS and USPSTF guidelines and agreed to schedule her next follow up appointment today.  Attestation Statements:   Reviewed by clinician on day of visit: allergies, medications, problem list, medical history, surgical history, family history, social history, and previous encounter notes.   I, Trixie Dredge, am acting as  transcriptionist for Dennard Nip, MD.  I have reviewed the above documentation for accuracy and completeness, and I agree with the above. -  Dennard Nip, MD

## 2021-04-01 ENCOUNTER — Ambulatory Visit (INDEPENDENT_AMBULATORY_CARE_PROVIDER_SITE_OTHER): Payer: BC Managed Care – PPO | Admitting: Dermatology

## 2021-04-01 ENCOUNTER — Other Ambulatory Visit: Payer: Self-pay

## 2021-04-01 ENCOUNTER — Encounter: Payer: Self-pay | Admitting: Dermatology

## 2021-04-01 DIAGNOSIS — L570 Actinic keratosis: Secondary | ICD-10-CM

## 2021-04-01 DIAGNOSIS — B079 Viral wart, unspecified: Secondary | ICD-10-CM

## 2021-04-01 NOTE — Patient Instructions (Signed)
Biopsy, Surgery (Curettage) & Surgery (Excision) Aftercare Instructions ? ?1. Okay to remove bandage in 24 hours ? ?2. Wash area with soap and water ? ?3. Apply Vaseline to area twice daily until healed (Not Neosporin) ? ?4. Okay to cover with a Band-Aid to decrease the chance of infection or prevent irritation from clothing; also it's okay to uncover lesion at home. ? ?5. Suture instructions: return to our office in 7-10 or 10-14 days for a nurse visit for suture removal. Variable healing with sutures, if pain or itching occurs call our office. It's okay to shower or bathe 24 hours after sutures are given. ? ?6. The following risks may occur after a biopsy, curettage or excision: bleeding, scarring, discoloration, recurrence, infection (redness, yellow drainage, pain or swelling). ? ?7. For questions, concerns and results call our office at Monday-Thursday before 4pm & Friday before 3pm. Biopsy results will be available in 1 week. ? ?

## 2021-04-03 DIAGNOSIS — N321 Vesicointestinal fistula: Secondary | ICD-10-CM | POA: Diagnosis not present

## 2021-04-03 DIAGNOSIS — R143 Flatulence: Secondary | ICD-10-CM | POA: Diagnosis not present

## 2021-04-03 DIAGNOSIS — K9400 Colostomy complication, unspecified: Secondary | ICD-10-CM | POA: Diagnosis not present

## 2021-04-10 ENCOUNTER — Encounter: Payer: Self-pay | Admitting: Dermatology

## 2021-04-10 NOTE — Progress Notes (Signed)
   Follow-Up Visit   Subjective  [name] is a 65 y.o. female who presents for the following: Follow-up (10 week f/u- LN2 left forehead).  To persistent pink crust forehead Location:  Duration:  Quality:  Associated Signs/Symptoms: Modifying Factors:  Severity:  Timing: Context:   Objective  Well appearing patient in no apparent distress; mood and affect are within normal limits. Left Forehead (2) Two verrucous 3 mm pink papules, AK's versus warts       A focused examination was performed including head and neck. Relevant physical exam findings are noted in the Assessment and Plan.   Assessment & Plan    Actinic keratosis (2) Left Forehead  Destruction of lesion - Left Forehead Complexity: simple   Destruction method: cryotherapy   Informed consent: discussed and consent obtained   Timeout:  patient name, date of birth, surgical site, and procedure verified Lesion destroyed using liquid nitrogen: Yes   Cryotherapy cycles:  3 Outcome: patient tolerated procedure well with no complications        I, Lavonna Monarch, MD, have reviewed all documentation for this visit.  The documentation on 04/10/21 for the exam, diagnosis, procedures, and orders are all accurate and complete.

## 2021-04-19 ENCOUNTER — Other Ambulatory Visit (INDEPENDENT_AMBULATORY_CARE_PROVIDER_SITE_OTHER): Payer: Self-pay | Admitting: Family Medicine

## 2021-04-19 DIAGNOSIS — E1169 Type 2 diabetes mellitus with other specified complication: Secondary | ICD-10-CM

## 2021-04-20 NOTE — Telephone Encounter (Signed)
Pt last seen by Dr. Beasley.  

## 2021-04-21 ENCOUNTER — Other Ambulatory Visit: Payer: Self-pay

## 2021-04-21 ENCOUNTER — Encounter (INDEPENDENT_AMBULATORY_CARE_PROVIDER_SITE_OTHER): Payer: Self-pay | Admitting: Family Medicine

## 2021-04-21 ENCOUNTER — Ambulatory Visit (INDEPENDENT_AMBULATORY_CARE_PROVIDER_SITE_OTHER): Payer: BC Managed Care – PPO | Admitting: Family Medicine

## 2021-04-21 VITALS — BP 132/85 | HR 65 | Temp 97.8°F | Ht 62.0 in | Wt 209.0 lb

## 2021-04-21 DIAGNOSIS — Z9189 Other specified personal risk factors, not elsewhere classified: Secondary | ICD-10-CM

## 2021-04-21 DIAGNOSIS — F3289 Other specified depressive episodes: Secondary | ICD-10-CM

## 2021-04-21 DIAGNOSIS — M25552 Pain in left hip: Secondary | ICD-10-CM | POA: Diagnosis not present

## 2021-04-21 DIAGNOSIS — E1169 Type 2 diabetes mellitus with other specified complication: Secondary | ICD-10-CM | POA: Diagnosis not present

## 2021-04-21 DIAGNOSIS — Z6838 Body mass index (BMI) 38.0-38.9, adult: Secondary | ICD-10-CM

## 2021-04-21 MED ORDER — BUPROPION HCL ER (SR) 150 MG PO TB12: 150.0000 mg | ORAL_TABLET | Freq: Two times a day (BID) | ORAL | 0 refills | Status: DC

## 2021-04-21 MED ORDER — OZEMPIC (0.25 OR 0.5 MG/DOSE) 2 MG/1.5ML ~~LOC~~ SOPN: 0.5000 mg | PEN_INJECTOR | SUBCUTANEOUS | 0 refills | Status: DC

## 2021-04-21 NOTE — Progress Notes (Signed)
Chief Complaint:   OBESITY [name] is here to discuss her progress with her obesity treatment plan along with follow-up of her obesity related diagnoses. [name] is on keeping a food journal and adhering to recommended goals of 1200-1300 calories and 75+ grams of protein daily and states she is following her eating plan approximately 60% of the time. [name] states she is doing 0 minutes 0 times per week.  Today's visit was #: 77 Starting weight: 208 lbs Starting date: 04/05/2018 Today's weight: 209 lbs Today's date: 04/21/2021 Total lbs lost to date: 0 Total lbs lost since last in-office visit: 1  Interim History: [name] has had to increase her food intake while on antibiotics for dental abscess and root canal. She has struggled with her eating due to this, but she is feeling better now.  Subjective:   1. Left hip pain [name] notes pain in the left side of her sacrum, and increased pain with palpitation and it has been worse in the last week. She notes mild improvement with Tylenol.   2. Type 2 diabetes mellitus with other specified complication, without long-term current use of insulin ([name]) Donald is stable on Ozempic, and she feels her polyphagia is decreased. She denies nausea or vomiting.  3. Other depression, with emotional eating [name] is stable on Wellbutrin despite extra stress in her life. She continues to work on diet and weight loss.  4. At risk for impaired metabolic function [name] is at increased risk for impaired metabolic function if protein decreases.  Assessment/Plan:   1. Left hip pain We will refer [name] to Dr. [name] for evaluation, and she will continue to follow up as directed.  2. Type 2 diabetes mellitus with other specified complication, without long-term current use of insulin ([place]) [name] will continue Ozempic 0.5 mg and we will refill for 1 month. Good blood sugar control is important to decrease the likelihood of diabetic complications such as  nephropathy, neuropathy, limb loss, blindness, coronary artery disease, and death. Intensive lifestyle modification including diet, exercise and weight loss are the first line of treatment for diabetes.   - Semaglutide,0.25 or 0.5MG /DOS, (OZEMPIC, 0.25 OR 0.5 MG/DOSE,) 2 MG/1.5ML SOPN; Inject 0.5 mg into the skin once a week.  Dispense: 1.5 mL; Refill: 0  3. Other depression, with emotional eating Behavior modification techniques were discussed today to help Eular deal with her emotional/non-hunger eating behaviors. We will refill Wellbutrin SR 150 mg BID #60 for 1 month. Orders and follow up as documented in patient record.   4. At risk for impaired metabolic function [name] was given approximately 15 minutes of impaired  metabolic function prevention counseling today. We discussed intensive lifestyle modifications today with an emphasis on specific nutrition and exercise instructions and strategies.   Repetitive spaced learning was employed today to elicit superior memory formation and behavioral change.  5. Obesity with current BMI 38.2 [name] is currently in the action stage of change. As such, her goal is to continue with weight loss efforts. She has agreed to keeping a food journal and adhering to recommended goals of 1200-1300 calories and 75+ grams of protein daily.   Behavioral modification strategies: increasing lean protein intake and keeping a strict food journal.  [name] has agreed to follow-up with our clinic in 3 weeks. She was informed of the importance of frequent follow-up visits to maximize her success with intensive lifestyle modifications for her multiple health conditions.   Objective:   Blood pressure 132/85, pulse 65, temperature  97.8 F (36.6 C), height 5\' 2"  (1.575 m), weight 209 lb (94.8 kg), SpO2 97 %. Body mass index is 38.23 kg/m.  General: Cooperative, alert, well developed, in no acute distress. HEENT: Conjunctivae and lids unremarkable. Cardiovascular:  Regular rhythm.  Lungs: Normal work of breathing. Neurologic: No focal deficits.   Lab Results  Component Value Date   CREATININE 1.86 (H) 02/26/2021   BUN 21 02/26/2021   NA 144 02/26/2021   K 4.4 02/26/2021   CL 106 02/26/2021   CO2 23 02/26/2021   Lab Results  Component Value Date   ALT 33 (H) 06/16/2020   AST 25 06/16/2020   ALKPHOS 175 (H) 06/16/2020   BILITOT 0.3 06/16/2020   Lab Results  Component Value Date   HGBA1C 6.3 (H) 06/16/2020   HGBA1C 7.9 (H) 09/01/2017   HGBA1C 5.3 04/01/2017   HGBA1C 5.8 (H) 09/05/2015   HGBA1C 6.8 (H) 11/21/2014   Lab Results  Component Value Date   INSULIN 14.1 06/16/2020   Lab Results  Component Value Date   TSH 3.38 01/18/2020   Lab Results  Component Value Date   CHOL 92 (L) 06/16/2020   HDL 49 06/16/2020   LDLCALC 21 06/16/2020   TRIG 128 06/16/2020   CHOLHDL 1.9 01/18/2020   Lab Results  Component Value Date   VD25OH 25.9 (L) 06/16/2020   VD25OH 40.8 04/05/2018   Lab Results  Component Value Date   WBC 5.4 02/26/2021   HGB 11.9 02/26/2021   HCT 37.3 02/26/2021   MCV 95 02/26/2021   PLT 218 02/26/2021   Lab Results  Component Value Date   IRON 7 (L) 08/31/2017   TIBC 164 (L) 08/31/2017   FERRITIN 27 11/29/2014   Attestation Statements:   Reviewed by clinician on day of visit: allergies, medications, problem list, medical history, surgical history, family history, social history, and previous encounter notes.   I, Trixie Dredge, am acting as transcriptionist for Dennard Nip, MD.  I have reviewed the above documentation for accuracy and completeness, and I agree with the above. -  Dennard Nip, MD

## 2021-04-22 DIAGNOSIS — E039 Hypothyroidism, unspecified: Secondary | ICD-10-CM | POA: Diagnosis not present

## 2021-04-22 DIAGNOSIS — I10 Essential (primary) hypertension: Secondary | ICD-10-CM | POA: Diagnosis not present

## 2021-04-22 DIAGNOSIS — N184 Chronic kidney disease, stage 4 (severe): Secondary | ICD-10-CM | POA: Diagnosis not present

## 2021-04-22 DIAGNOSIS — E119 Type 2 diabetes mellitus without complications: Secondary | ICD-10-CM | POA: Diagnosis not present

## 2021-04-22 DIAGNOSIS — E782 Mixed hyperlipidemia: Secondary | ICD-10-CM | POA: Diagnosis not present

## 2021-04-22 NOTE — Progress Notes (Signed)
I, [name], LAT, ATC, am serving as scribe for Dr. [name].  Subjective:    CC: L hip pain  HPI: Pt is a 65 y/o female c/o low back and sacral pain that is progressively worsening over the past few weeks. Pt locates her pain to the L-side of her sacrum.  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  She is had multiple surgeries as result the last surgery in 2020.  She also had pelvic radiation.  Radiates: No LE Numbness/tingling: No Aggravates: palpation to the area; L hip flexion and aBd AROM; FABER position; prolonged sitting Treatments tried: Tylenol   Pertinent review of Systems: No fevers or chills  Relevant historical information: Cancer history as above   Objective:    Vitals:   04/23/21 1425  BP: 120/78  Pulse: 70  SpO2: 98%   General: Well Developed, well nourished, and in no acute distress.   MSK: L-spine nontender midline.  Tender palpation SI joints and lower portion of paraspinal musculature lower lumbar spine. Decreased lumbar motion. Lower extremity strength generally intact.  Lab and Radiology Results  X-ray images L-spine and sacrum obtained today personally and independently interpreted.  L-spine: Mild anterolisthesis L3-L4.  Significant DDD L5-S1 facet DJD L5-S1.  Sacrum: Mild degenerative changes bilateral SI joints.  Extensive surgical clips present in the pelvis.  No aggressive appearing lytic lesions.  Await formal radiology review   Impression and Recommendations:    Assessment and Plan: 65 y.o. female with chronic low back pain.  Multifactorial.  Pain likely due to core weakness from her extensive abdominal surgical history.  Additionally she has significant degenerative changes present on her L-spine and SI joints per my read.  Radiology overread is still pending.  Plan for conventional physical therapy.   If needed she could have aquatic PT but it would be more challenging with her urostomy and colostomy bags.  Limited tizanidine mostly at bedtime.  Avoid NSAIDs given CKD 3.  Recheck in 1 month.  CC PCP and Dr. Leafy Ro  PDMP not reviewed this encounter. Orders Placed This Encounter  Procedures   DG Lumbar Spine 2-3 Views    Standing Status:   Future    Number of Occurrences:   1    Standing Expiration Date:   04/23/2022    Order Specific Question:   Reason for Exam (SYMPTOM  OR DIAGNOSIS REQUIRED)    Answer:   eval low back pain. Hx cancer    Order Specific Question:   Preferred imaging location?    Answer:   [facility]   DG Sacrum/Coccyx    Standing Status:   Future    Number of Occurrences:   1    Standing Expiration Date:   04/23/2022    Order Specific Question:   Reason for Exam (SYMPTOM  OR DIAGNOSIS REQUIRED)    Answer:   eval sacrum pain    Order Specific Question:   Preferred imaging location?    Answer:   [facility]   Ambulatory referral to Physical Therapy    Referral Priority:   Routine    Referral Type:   Physical Medicine    Referral Reason:   Specialty Services Required    Requested Specialty:   Physical Therapy    Number of Visits Requested:   1   Meds ordered this encounter  Medications   tiZANidine (ZANAFLEX) 4  MG tablet    Sig: Take 0.5-1 tablets (2-4 mg total) by mouth every 8 (eight) hours as needed for muscle spasms.    Dispense:  60 tablet    Refill:  1    Discussed warning signs or symptoms. Please see discharge instructions. Patient expresses understanding.   The above documentation has been reviewed and is accurate and complete [name], M.D.

## 2021-04-23 ENCOUNTER — Encounter: Payer: Self-pay | Admitting: Family Medicine

## 2021-04-23 ENCOUNTER — Ambulatory Visit (INDEPENDENT_AMBULATORY_CARE_PROVIDER_SITE_OTHER): Payer: BC Managed Care – PPO

## 2021-04-23 ENCOUNTER — Ambulatory Visit (INDEPENDENT_AMBULATORY_CARE_PROVIDER_SITE_OTHER): Payer: BC Managed Care – PPO | Admitting: Family Medicine

## 2021-04-23 ENCOUNTER — Other Ambulatory Visit: Payer: Self-pay

## 2021-04-23 VITALS — BP 120/78 | HR 70 | Ht 62.0 in | Wt 209.0 lb

## 2021-04-23 DIAGNOSIS — G8929 Other chronic pain: Secondary | ICD-10-CM

## 2021-04-23 DIAGNOSIS — M545 Low back pain, unspecified: Secondary | ICD-10-CM

## 2021-04-23 DIAGNOSIS — M533 Sacrococcygeal disorders, not elsewhere classified: Secondary | ICD-10-CM | POA: Diagnosis not present

## 2021-04-23 MED ORDER — TIZANIDINE HCL 4 MG PO TABS: 2.0000 mg | ORAL_TABLET | Freq: Three times a day (TID) | ORAL | 1 refills | Status: DC | PRN

## 2021-04-23 NOTE — Patient Instructions (Addendum)
Thank you for coming in today.   I've referred you to Physical Therapy.  Let us know if you don't hear from them in one week.   Try a heating pad.   Use tizanidine at bedtime as needed. It may make you sleepy.   Recheck in 1 month.   Please get an Xray today before you leave

## 2021-04-24 DIAGNOSIS — N1832 Chronic kidney disease, stage 3b: Secondary | ICD-10-CM | POA: Diagnosis not present

## 2021-04-24 DIAGNOSIS — E1142 Type 2 diabetes mellitus with diabetic polyneuropathy: Secondary | ICD-10-CM | POA: Diagnosis not present

## 2021-04-24 DIAGNOSIS — I129 Hypertensive chronic kidney disease with stage 1 through stage 4 chronic kidney disease, or unspecified chronic kidney disease: Secondary | ICD-10-CM | POA: Diagnosis not present

## 2021-04-24 DIAGNOSIS — E1122 Type 2 diabetes mellitus with diabetic chronic kidney disease: Secondary | ICD-10-CM | POA: Diagnosis not present

## 2021-04-27 NOTE — Progress Notes (Signed)
X-ray of the sacrum does not show any significant bony abnormalities

## 2021-04-27 NOTE — Progress Notes (Signed)
Lumbar spine x-ray shows medium multilevel arthritis changes worse at the base of the spine and L5 and S1

## 2021-04-29 DIAGNOSIS — E119 Type 2 diabetes mellitus without complications: Secondary | ICD-10-CM | POA: Diagnosis not present

## 2021-04-29 DIAGNOSIS — I10 Essential (primary) hypertension: Secondary | ICD-10-CM | POA: Diagnosis not present

## 2021-04-29 DIAGNOSIS — N1832 Chronic kidney disease, stage 3b: Secondary | ICD-10-CM | POA: Diagnosis not present

## 2021-04-29 DIAGNOSIS — E872 Acidosis: Secondary | ICD-10-CM | POA: Diagnosis not present

## 2021-05-02 DIAGNOSIS — Z8616 Personal history of COVID-19: Secondary | ICD-10-CM

## 2021-05-02 HISTORY — DX: Personal history of COVID-19: Z86.16

## 2021-05-05 ENCOUNTER — Other Ambulatory Visit: Payer: Self-pay

## 2021-05-05 ENCOUNTER — Ambulatory Visit (HOSPITAL_BASED_OUTPATIENT_CLINIC_OR_DEPARTMENT_OTHER): Payer: BC Managed Care – PPO | Attending: Family Medicine | Admitting: Physical Therapy

## 2021-05-05 ENCOUNTER — Encounter (HOSPITAL_BASED_OUTPATIENT_CLINIC_OR_DEPARTMENT_OTHER): Payer: Self-pay | Admitting: Physical Therapy

## 2021-05-05 DIAGNOSIS — G8929 Other chronic pain: Secondary | ICD-10-CM | POA: Insufficient documentation

## 2021-05-05 DIAGNOSIS — M6281 Muscle weakness (generalized): Secondary | ICD-10-CM | POA: Insufficient documentation

## 2021-05-05 DIAGNOSIS — M545 Low back pain, unspecified: Secondary | ICD-10-CM | POA: Diagnosis not present

## 2021-05-05 NOTE — Therapy (Signed)
OUTPATIENT PHYSICAL THERAPY THORACOLUMBAR EVALUATION   Patient Name: [name] MRN: [id number] DOB:[birth year], 65 y.o., female Today's Date: 05/06/2021   PT End of Session - 05/05/21 1201     Visit Number 1    Number of Visits 17    Date for PT Re-Evaluation 07/03/21    Authorization Type MCR    Progress Note Due on Visit 10    PT Start Time 9892    PT Stop Time 1194    PT Time Calculation (min) 40 min    Activity Tolerance Patient tolerated treatment well    Behavior During Therapy [facility] for tasks assessed/performed             Past Medical History:  Diagnosis Date   Adrenal adenoma, left 02/08/2016   CT: stable benign   Anemia in neoplastic disease    Back pain    Benign essential HTN    Breast cancer, left ([place]) dx 10-30-2015  oncologist-  dr Ernst Spell gorsuch   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+, HER2 negative/  12-11-2015 bilateral mastecotmy w/ reconstruction (no radiation and no chemo)   Cancer of corpus uteri, except isthmus [facility])  oncologist-- dr Denman [name] and dr Alvy Bimler    10-15-2004  dx endometroid endometrial and ovarian cancer s/p  chemotheapy and surgery(TAH w/ BSO) :  recurrent 11-19-2014 post pelvic surgery and radiation 01-29-2015 to 03-10-2015   Chronic idiopathic neutropenia ([place])    presumed related to chemotherapy March 2006--- followed by dr Alvy Bimler (treatment w/ G-CSF injections   Chronic nausea    Chronic pain    perineal/ anal  area from bladder pad irritates skin , right flank pain   CKD stage G2/A3, GFR 60-89 and albumin creatinine ratio >300 mg/g    nephrologist-  dr Madelon Lips   Colovesical fistula    Diabetic retinopathy, background ([place])    Difficult intravenous access    small veins--- hx PICC lines   DM type 2 (diabetes mellitus, type 2) ([place])    monitored by dr Legrand Como altheimer   Dysuria    Environmental and seasonal allergies    Fatty liver 02/08/2016   CT   Generalized muscle weakness    GERD  (gastroesophageal reflux disease)    Hiatal hernia    History of abdominal abscess 04/16/2017   post surgery 04-01-2017  --- resolved 10/ 2018   History of gastric polyp    2014  duodenum   History of ileus 04/16/2017   resolved w/ no surgical intervention   History of radiation therapy    01-29-2015 to 03-10-2015  pelvis 50.4Gy   Hypothyroidism    monitored by dr Legrand Como altheimer   IBS (irritable bowel syndrome)    Ileostomy in place ([place]) 04/01/2017   created at same time colostomy takedown.   Joint pain    Leg edema    Lower urinary tract symptoms (LUTS)    urge urinary  incontinence   Mixed dyslipidemia    Multiple thyroid nodules    Managed by Dr. Harlow Asa   Nephrostomy status [facility])    Palpitations    Pelvic abscess in female 04/16/2017   PONV (postoperative nausea and vomiting)    "scopolamine patch works for me"   Radiation-induced dermatitis    contact dermatitis , radiation completed, rash only on ankles now.   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   Vitamin D deficiency    Wears glasses    Past Surgical History:  Procedure Laterality Date   APPENDECTOMY     biopsy thyroid nodules     BREAST RECONSTRUCTION WITH PLACEMENT OF TISSUE EXPANDER AND FLEX HD (ACELLULAR HYDRATED DERMIS) Bilateral 12/11/2015   Procedure: BILATERAL BREAST RECONSTRUCTION WITH PLACEMENT OF TISSUE EXPANDERS;  Surgeon: Irene Limbo, MD;  Location: [place];  Service: Plastics;  Laterality: Bilateral;   COLONOSCOPY WITH PROPOFOL N/A 08/21/2013   Procedure: COLONOSCOPY WITH PROPOFOL;  Surgeon: Cleotis Nipper, MD;  [facility];  Service: Endoscopy;  Laterality: N/A;   COLOSTOMY TAKEDOWN N/A 12/04/2014   Procedure: LAPROSCOPIC LYSIS OF ADHESIONS, SPLENIC MOBILIZATION, RELOCATION OF COLOSTOMY, DEBRIDEMENT INITIAL COLOSTOMY SITE;   Surgeon: Michael [name], MD;  [facility];  Service: General;  Laterality: N/A;   CYSTOGRAM N/A 06/01/2017   Procedure: CYSTOGRAM;  Surgeon: Alexis Frock, MD;  [facility];  Service: Urology;  Laterality: N/A;   CYSTOSCOPY W/ RETROGRADES Right 11/21/2015   Procedure: CYSTOSCOPY WITH RETROGRADE PYELOGRAM;  Surgeon: Alexis Frock, MD;  [facility];  Service: Urology;  Laterality: Right;   CYSTOSCOPY W/ URETERAL STENT PLACEMENT Right 11/21/2015   Procedure: CYSTOSCOPY WITH STENT REPLACEMENT;  Surgeon: Alexis Frock, MD;  [facility];  Service: Urology;  Laterality: Right;   CYSTOSCOPY W/ URETERAL STENT PLACEMENT Right 03/10/2016   Procedure: CYSTOSCOPY WITH STENT REPLACEMENT;  Surgeon: Alexis Frock, MD;  Location: [facility];  Service: Urology;  Laterality: Right;   CYSTOSCOPY W/ URETERAL STENT PLACEMENT Right 06/30/2016   Procedure: CYSTOSCOPY WITH RETROGRADE PYELOGRAM/URETERAL STENT EXCHANGE;  Surgeon: Alexis Frock, MD;  Location: [facility];  Service: Urology;  Laterality: Right;   CYSTOSCOPY W/ URETERAL STENT PLACEMENT N/A 06/01/2017   Procedure: CYSTOSCOPY WITH EXAM UNDER ANESTHESIA;  Surgeon: Alexis Frock, MD;  [facility];  Service: Urology;  Laterality: N/A;   CYSTOSCOPY W/ URETERAL STENT PLACEMENT Right 08/17/2017   Procedure: CYSTOSCOPY WITH RETROGRADE PYELOGRAM/URETERAL STENT REMOVAL;  Surgeon: Alexis Frock, MD;  Location[facility];  Service: Urology;  Laterality: Right;   CYSTOSCOPY WITH RETROGRADE PYELOGRAM, URETEROSCOPY AND STENT PLACEMENT Right 03/20/2015   Procedure: CYSTOSCOPY WITH RETROGRADE PYELOGRAM, URETEROSCOPY WITH BALLOON DILATION AND STENT PLACEMENT ON RIGHT;  Surgeon: Alexis Frock, MD;  Location: [facility];  Service: Urology;  Laterality: Right;   CYSTOSCOPY WITH RETROGRADE PYELOGRAM, URETEROSCOPY AND STENT PLACEMENT Right 05/02/2015   Procedure: CYSTOSCOPY WITH RIGHT  RETROGRADE PYELOGRAM,  DIAGNOSTIC URETEROSCOPY AND STENT PULL ;  Surgeon: Alexis Frock, MD;  Location: [facility];  Service: Urology;  Laterality: Right;   CYSTOSCOPY WITH RETROGRADE PYELOGRAM, URETEROSCOPY AND STENT PLACEMENT Right 09/05/2015   Procedure: CYSTOSCOPY WITH RETROGRADE PYELOGRAM,  AND STENT PLACEMENT;  Surgeon: Alexis Frock, MD;  [facility];  Service: Urology;  Laterality: Right;   CYSTOSCOPY WITH RETROGRADE PYELOGRAM, URETEROSCOPY AND STENT PLACEMENT Right 04/01/2017   Procedure: CYSTOSCOPY WITH RETROGRADE PYELOGRAM, URETEROSCOPY AND STENT PLACEMENT;  Surgeon: Alexis Frock, MD;  [facility];  Service: Urology;  Laterality: Right;   CYSTOSCOPY WITH STENT PLACEMENT Right 10/27/2016   Procedure: CYSTOSCOPY WITH STENT CHANGE and right retrograde pyelogram;  Surgeon: Alexis Frock, MD;  Location: [facility];  Service: Urology;  Laterality: Right;   EUS N/A 10/02/2014   Procedure: LOWER ENDOSCOPIC ULTRASOUND (EUS);  Surgeon: Arta Silence, MD;  Location: [facility];  Service: Endoscopy;  Laterality: N/A;   EXCISION SOFT TISSUE  MASS RIGHT FOREMAN  12-08-2006   EYE SURGERY  as child   pytosis of eyelids repair   INCISION AND DRAINAGE OF WOUND Bilateral 12/26/2015   Procedure: DEBRIDEMENT OF BILATERAL MASTECTOMY FLAPS;  Surgeon: Irene Limbo, MD;  Location: [place];  Service: Plastics;  Laterality: Bilateral;   IR CV LINE INJECTION  05/31/2017   IR FLUORO GUIDE CV LINE LEFT  05/31/2017   IR FLUORO GUIDE CV LINE RIGHT  04/06/2017   IR FLUORO GUIDE CV MIDLINE PICC RIGHT  05/30/2017   IR NEPHROSTOGRAM LEFT INITIAL PLACEMENT  09/02/2017   IR NEPHROSTOGRAM LEFT THRU EXISTING ACCESS  11/29/2017   IR NEPHROSTOGRAM RIGHT INITIAL PLACEMENT  09/02/2017   IR NEPHROSTOGRAM RIGHT THRU EXISTING ACCESS  09/13/2017   IR NEPHROSTOGRAM RIGHT THRU EXISTING ACCESS  11/29/2017   IR NEPHROSTOMY EXCHANGE LEFT  11/28/2017   IR NEPHROSTOMY EXCHANGE LEFT   01/05/2018   IR NEPHROSTOMY EXCHANGE LEFT  02/16/2018   IR NEPHROSTOMY EXCHANGE LEFT  03/30/2018   IR NEPHROSTOMY EXCHANGE LEFT  05/12/2018   IR NEPHROSTOMY EXCHANGE LEFT  06/21/2018   IR NEPHROSTOMY EXCHANGE LEFT  08/04/2018   IR NEPHROSTOMY EXCHANGE LEFT  09/18/2018   IR NEPHROSTOMY EXCHANGE LEFT  10/09/2018   IR NEPHROSTOMY EXCHANGE LEFT  10/27/2018   IR NEPHROSTOMY EXCHANGE LEFT  11/21/2018   IR NEPHROSTOMY EXCHANGE LEFT  01/05/2019   IR NEPHROSTOMY EXCHANGE LEFT  02/15/2019   IR NEPHROSTOMY EXCHANGE LEFT  03/29/2019   IR NEPHROSTOMY EXCHANGE RIGHT  10/02/2017   IR NEPHROSTOMY EXCHANGE RIGHT  11/28/2017   IR NEPHROSTOMY EXCHANGE RIGHT  01/05/2018   IR NEPHROSTOMY EXCHANGE RIGHT  02/16/2018   IR NEPHROSTOMY EXCHANGE RIGHT  03/30/2018   IR NEPHROSTOMY EXCHANGE RIGHT  05/12/2018   IR NEPHROSTOMY EXCHANGE RIGHT  06/21/2018   IR NEPHROSTOMY EXCHANGE RIGHT  08/04/2018   IR NEPHROSTOMY EXCHANGE RIGHT  09/18/2018   IR NEPHROSTOMY EXCHANGE RIGHT  10/27/2018   IR NEPHROSTOMY EXCHANGE RIGHT  11/21/2018   IR NEPHROSTOMY EXCHANGE RIGHT  01/05/2019   IR NEPHROSTOMY EXCHANGE RIGHT  02/15/2019   IR NEPHROSTOMY EXCHANGE RIGHT  03/29/2019   IR NEPHROSTOMY PLACEMENT LEFT  10/02/2017   IR RADIOLOGIST EVAL & MGMT  05/03/2017   IR US GUIDE VASC ACCESS LEFT  05/31/2017   IR US GUIDE VASC ACCESS RIGHT  04/06/2017   IR US GUIDE VASC ACCESS RIGHT  05/30/2017   LAPAROSCOPIC CHOLECYSTECTOMY  1990   LIPOSUCTION WITH LIPOFILLING Bilateral 04/16/2016   Procedure: LIPOSUCTION WITH LIPOFILLING TO BILATERAL CHEST;  Surgeon: Irene Limbo, MD;  Location: [place];  Service: Plastics;  Laterality: Bilateral;   MASTECTOMY W/ SENTINEL NODE BIOPSY Bilateral 12/11/2015   Procedure: RIGHT PROPHYLACTIC MASTECTOMY, LEFT TOTAL MASTECTOMY WITH LEFT AXILLARY SENTINEL LYMPH NODE BIOPSY;  Surgeon: Stark Klein, MD;  Location: [place];  Service: General;  Laterality: Bilateral;   OSTOMY N/A 11/19/2014   Procedure: OSTOMY;  Surgeon: Michael [name], MD;  [facility];  Service: General;  Laterality: N/A;   PROCTOSCOPY N/A 04/01/2017   Procedure: RIDGE PROCTOSCOPY;  Surgeon: Michael [name], MD;  [facility];  Service: General;  Laterality: N/A;   REMOVAL OF BILATERAL TISSUE EXPANDERS WITH PLACEMENT OF BILATERAL BREAST IMPLANTS Bilateral 04/16/2016   Procedure: REMOVAL OF BILATERAL TISSUE EXPANDERS WITH PLACEMENT OF BILATERAL BREAST IMPLANTS;  Surgeon: Irene Limbo, MD;  Location: [place];  Service: Plastics;  Laterality: Bilateral;   ROBOTIC ASSISTED LAP VAGINAL HYSTERECTOMY N/A 11/19/2014   Procedure: ROBOTIC LYSIS  OF ADHESIONS, CONVERTED TO LAPAROTOMY RADICAL UPPER VAGINECTOMY,LOW ANTERIOR BOWEL RESECTION, COLOSTOMY, BILATERAL URETERAL STENT PLACEMENT AND CYSTONOMY CLOSURE;  Surgeon: Everitt Amber, MD;  [facility];  Service: Gynecology;  Laterality: N/A;   TISSUE EXPANDER FILLING Bilateral 12/26/2015   Procedure: EXPANSION OF BILATERAL CHEST TISSUE EXPANDERS (60 mL- Right; 75 mL- Left);  Surgeon: Irene Limbo, MD;  Location: [place];  Service: Plastics;  Laterality: Bilateral;   TONSILLECTOMY     TOTAL ABDOMINAL HYSTERECTOMY  March 2006   [facility]   and Bilateral Salpingoophorectomy/  staging for Ovarian cancer/  an   XI ROBOTIC ASSISTED LOWER ANTERIOR RESECTION N/A 04/01/2017   Procedure: XI ROBOTIC VS LAPAROSCOPIC COLOSTOMY TAKEDOWN WITH LYSIS OF ADHESIONS.;  Surgeon: Michael [name], MD;  [facility];  Service: General;  Laterality: N/A;  ERAS PATHWAY   Patient Active Problem List   Diagnosis Date Noted   DM (diabetes mellitus) type II, controlled, with peripheral vascular disorder ([place]) 01/20/2021   Essential (hemorrhagic) thrombocythemia ([place]) 01/20/2021   First degree heart block 01/18/2020   Paroxysmal atrial fibrillation ([place]) 01/18/2020   Swelling of right parotid gland 10/03/2019   Vesicorectal fistula 02/26/2019   Chronic pain    SBO (small bowel obstruction) ([place])  01/17/2019   Depression 01/16/2019   Other chronic postprocedural pain 05/25/2018   Malignant neoplasm of uterus ([place]) 05/25/2018   Recurrent UTI 03/08/2018   Acute pyelonephritis 11/29/2017   HLD (hyperlipidemia) 11/29/2017   GERD (gastroesophageal reflux disease) 11/29/2017   Type II diabetes mellitus with renal manifestations ([place]) 11/29/2017   Encounter for palliative care    Preventative health care    Poor venous access    Acute renal failure superimposed on chronic kidney disease ([place]) 10/02/2017   Decubitus ulcer of sacral region, stage 2 ([place]) 84/53/6468   Metabolic acidosis 10/21/2246   MDD (major depressive disorder), single episode, moderate ([name])    Poorly controlled diabetes mellitus ([place]) 08/31/2017   Pressure injury of skin 08/31/2017   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 08/31/2017   History of external beam radiation therapy to pelvis 08/31/2017   ARF (acute renal failure) ([place]) 08/30/2017   Adjustment disorder with mixed anxiety and depressed mood 08/30/2017   Dehydration 08/29/2017   CKD (chronic kidney disease), stage III ([place]) 08/24/2017   Wound of right buttock 08/10/2017   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[place]) 06/20/2017   Postoperative anemia 04/04/2017   Pelvic cancer s/p colostomy takedown/loop ileostomy diversion 04/01/2017 04/01/2017   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 04/01/2017   Hot flashes related to aromatase inhibitor therapy 03/18/2017   Ureteral stricture, right, 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) 04/01/2017 09/10/2016   Vitamin D insufficiency 08/30/2016   Genetic testing 11/27/2015   Cancer of corpus uteri, except isthmus ([place]) 11/13/2015   Breast cancer of upper-inner quadrant of left female breast ([place])    Pelvic pain in female 03/19/2015   Chronic anemia 12/30/2014   UTI (urinary tract infection)    Pancytopenia, acquired ([place]) 11/26/2014   Class 2 severe obesity with serious comorbidity and body mass index (BMI)  of 38.0 to 38.9 in adult [facility]) 11/20/2014   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 s/p resection/partial vaginectomy/ LAR/colostomy 11/19/2014 11/19/2014   Abdominal pain 09/09/2014   Postmenopausal bleeding 09/05/2014   History of ovarian & endometrial cancer 07/17/2012   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[place]) 05/10/2012   Chronic neutropenia ([place]) 12/14/2011   Primary hypothyroidism 12/14/2011   DM type 2 (diabetes mellitus, type 2) ([place])  12/14/2011   Benign essential HTN 12/14/2011    PCP: Ann Held, DO  REFERRING PROVIDER: Gregor Hams, MD  REFERRING DIAG: [phone] (ICD-10-CM) - Chronic bilateral low back pain without sciatica   THERAPY DIAG:  Chronic left-sided low back pain without sciatica  Muscle weakness (generalized)  ONSET DATE: since radiation in 2016 with slow progression   SUBJECTIVE:                                                                                                                                                                                           SUBJECTIVE STATEMENT: Very extensive medical history and multiple abdominal surgeries. All the pain is on my left side- rubbing SIJ region.  PERTINENT HISTORY:  H/o CA, ostomy bag, radiation to pelvis, can't sweat due to skin breakdown, reports known LLD, multiple abdomina surgeries  PAIN:  Are you having pain? Yes VAS scale: 5/10 Pain location: low back Pain orientation: Left  PAIN TYPE: aching Pain description: intermittent  Aggravating factors: standing, walking Relieving factors: laying down  PRECAUTIONS: Other: h/o CA, ostomy bag, cannot sweat d/t skin breakdown  WEIGHT BEARING RESTRICTIONS No  FALLS:  Has patient fallen in last 6 months? No,   LIVING ENVIRONMENT: Lives with: lives with their family and lives with their spouse Lives in: House/apartment   OCCUPATION: retired L&D nurse, hospital admin  PLOF: Independent  PATIENT GOALS decrease  struggle to get up in the AM- pain on Lt buttock; lift Lt leg without it hurting, have to sit and stretch for several minutes in the AM before I can get up, improve balance   OBJECTIVE:   DIAGNOSTIC FINDINGS: imaging 9/22 Mild-to-moderate multilevel lumbar spine DDD, worse at L5-S1. No definite displaced sacral or coccygeal fracture. Limited visualization of the bilateral SI joints, hips and pubic symphysis is normal.   Degenerative change of the lower lumbar spine is suspected though incompletely evaluated.   Multiple surgical clips overlie the midline of the lower abdomen and pelvis bilaterally.  PATIENT SURVEYS:  To be completed  SCREENING FOR RED FLAGS: Being monitored by necessary specialties for return of CA  COGNITION:  Overall cognitive status: Within functional limits for tasks assessed       POSTURE:  Slightly flexed posture in standing, Rt hip elevation  LUMBARAROM/PROM  A/PROM A/PROM  05/06/2021  Flexion   Extension   Right lateral flexion   Left lateral flexion   Right rotation   Left rotation    (Blank rows = not tested)  LE AROM/PROM:  A/PROM Right 05/06/2021 Left 05/06/2021  Hip flexion    Hip extension    Hip  abduction    Hip adduction    Hip internal rotation    Hip external rotation    Knee flexion    Knee extension    Ankle dorsiflexion    Ankle plantarflexion    Ankle inversion    Ankle eversion     (Blank rows = not tested)  LE MMT:   NT at eval  MMT Right 05/06/2021 Left 05/06/2021  Hip flexion    Hip extension    Hip abduction    Hip adduction    Hip internal rotation    Hip external rotation    Knee flexion    Knee extension    Ankle dorsiflexion    Ankle plantarflexion    Ankle inversion    Ankle eversion     (Blank rows = not tested)   GAIT: Distance walked: around clinic Assistive device utilized: None Level of assistance: Complete Independence Comments: +Trendelenbug, flexed posture, slow  cadence    TODAY'S TREATMENT  EVAL:  Seated abdominal contraction, added scap retraction for resting posture   PATIENT EDUCATION:  Education details: Geophysicist/field seismologist of condition, POC, HEP, exercise form/rationale  Person educated: Patient Education method: Consulting civil engineer, Demonstration, Tactile cues, and Verbal cues Education comprehension: verbalized understanding, returned demonstration, verbal cues required, tactile cues required, and needs further education   HOME EXERCISE PROGRAM: Something under feet, pillow for lumbar support, abdominal engagement seated, try heel lift (2 layer Rt shoe)  ASSESSMENT:  CLINICAL IMPRESSION: Patient is a 65 y.o. F who was seen today for physical therapy evaluation and treatment for LBP. Objective impairments include Abnormal gait, decreased activity tolerance, decreased balance, decreased endurance, difficulty walking, decreased strength, increased muscle spasms, improper body mechanics, and pain. These impairments are limiting patient from cleaning, community activity, driving, meal prep, laundry, and shopping. Personal factors including  (see medical history above)  are also affecting patient's functional outcome. Patient will benefit from skilled PT to address above impairments and improve overall function. Pt has a very extensive medial history and multiple contributions to core weakness and lack of lumbopelvic support. Will progress exercise and strengthening as tolerated. Provided with a piece of kinesiotape to trail as another option for adhesive.   REHAB POTENTIAL: Fair    CLINICAL DECISION MAKING: Unstable/unpredictable  EVALUATION COMPLEXITY: Moderate   GOALS: Goals reviewed with patient? Yes  SHORT TERM GOALS:  STG Name Target Date Goal status  1 Pt will verbalize ability to obtain a gentle core contraction in daily, resting postures Baseline: difficult at eval but was able to recognize with cuing  05/29/21 INITIAL  2 Pt will begin working  on a daily walking program with knowledge of limitations to avoid sweating and impacting skin Baseline: will discuss and progress as appropriate 05/29/21 INITIAL  3 Pt will be independent in short term HEP as it has been established Baseline:will progress as appropriate 05/29/21 INITIAL  4 Pt will be ready to transition to aquatic exercises Baseline: multiple considerations to take into account 05/29/21 INITIAL                  LONG TERM GOALS:  to be set at short term re-evaluation based on response to exercises  LTG Name Target Date Goal status  1     2     3     4     5     6     7       PLAN: PT FREQUENCY: 2x/week  PT DURATION: 8 weeks  PLANNED INTERVENTIONS:  Therapeutic exercises, Therapeutic activity, Neuro Muscular re-education, Balance training, Gait training, Patient/Family education, Joint mobilization, Stair training, Aquatic Therapy, Cryotherapy, Moist heat, Taping, and Manual therapy  PLAN FOR NEXT SESSION: hip abd strength, core activation, balance testing  Helios Kohlmann C. Djibril Glogowski PT, DPT 05/06/21 1:02 PM

## 2021-05-06 ENCOUNTER — Encounter (HOSPITAL_BASED_OUTPATIENT_CLINIC_OR_DEPARTMENT_OTHER): Payer: Self-pay | Admitting: Physical Therapy

## 2021-05-11 ENCOUNTER — Ambulatory Visit (INDEPENDENT_AMBULATORY_CARE_PROVIDER_SITE_OTHER): Payer: BC Managed Care – PPO | Admitting: Family Medicine

## 2021-05-11 ENCOUNTER — Other Ambulatory Visit: Payer: Self-pay

## 2021-05-11 ENCOUNTER — Encounter (INDEPENDENT_AMBULATORY_CARE_PROVIDER_SITE_OTHER): Payer: Self-pay | Admitting: Family Medicine

## 2021-05-11 VITALS — BP 140/79 | HR 57 | Temp 97.5°F | Ht 62.0 in | Wt 214.0 lb

## 2021-05-11 DIAGNOSIS — Z6838 Body mass index (BMI) 38.0-38.9, adult: Secondary | ICD-10-CM | POA: Diagnosis not present

## 2021-05-11 DIAGNOSIS — E1169 Type 2 diabetes mellitus with other specified complication: Secondary | ICD-10-CM | POA: Diagnosis not present

## 2021-05-11 MED ORDER — SEMAGLUTIDE (1 MG/DOSE) 4 MG/3ML ~~LOC~~ SOPN: 1.0000 mg | PEN_INJECTOR | SUBCUTANEOUS | 0 refills | Status: DC

## 2021-05-12 ENCOUNTER — Encounter (HOSPITAL_BASED_OUTPATIENT_CLINIC_OR_DEPARTMENT_OTHER): Payer: Self-pay | Admitting: Physical Therapy

## 2021-05-12 ENCOUNTER — Ambulatory Visit (HOSPITAL_BASED_OUTPATIENT_CLINIC_OR_DEPARTMENT_OTHER): Payer: BC Managed Care – PPO | Admitting: Physical Therapy

## 2021-05-12 DIAGNOSIS — M6281 Muscle weakness (generalized): Secondary | ICD-10-CM

## 2021-05-12 DIAGNOSIS — G8929 Other chronic pain: Secondary | ICD-10-CM

## 2021-05-12 DIAGNOSIS — M545 Low back pain, unspecified: Secondary | ICD-10-CM

## 2021-05-12 NOTE — Progress Notes (Signed)
Chief Complaint:   OBESITY [name] is here to discuss her progress with her obesity treatment plan along with follow-up of her obesity related diagnoses. [name] is on keeping a food journal and adhering to recommended goals of 1200-1300 calories and 75+ grams of protein daily and states she is following her eating plan approximately 50% of the time. [name] states she is doing physical therapy for 45 minutes 2 times per week.  Today's visit was #: 30 Starting weight: 208 lbs Starting date: 04/05/2018 Today's weight: 214 lbs Today's date: 05/11/2021 Total lbs lost to date: 0 Total lbs lost since last in-office visit: 0  Interim History: [name] has struggled with weight loss recently. She has been working on increasing her protein. She is having challenges with meal planning while her house is being remodeled.   Subjective:   1. Type 2 diabetes mellitus with other specified complication, without long-term current use of insulin (HCC) [name] is on Ozempic and she is tolerating it well. She is open to changing her dose to help more with weight loss.  Assessment/Plan:   1. Type 2 diabetes mellitus with other specified complication, without long-term current use of insulin ([place]) [name] agreed to increase Ozempic to 1 mg q weekly, and we will refill for 90 days with no refills. Good blood sugar control is important to decrease the likelihood of diabetic complications such as nephropathy, neuropathy, limb loss, blindness, coronary artery disease, and death. Intensive lifestyle modification including diet, exercise and weight loss are the first line of treatment for diabetes.   - Semaglutide, 1 MG/DOSE, 4 MG/3ML SOPN; Inject 1 mg as directed once a week.  Dispense: 9 mL; Refill: 0  2. Obesity with current BMI 39.2 [name] is currently in the action stage of change. As such, her goal is to continue with weight loss efforts. She has agreed to keeping a food journal and adhering to recommended goals of  1200-1300 calories and 75+ grams of protein daily.   Higher protein options were discussed.  Exercise goals: As is.  Behavioral modification strategies: increasing lean protein intake and keeping a strict food journal.  [name] has agreed to follow-up with our clinic in 3 to 4 weeks. She was informed of the importance of frequent follow-up visits to maximize her success with intensive lifestyle modifications for her multiple health conditions.   Objective:   Blood pressure 140/79, pulse (!) 57, temperature (!) 97.5 F (36.4 C), height 5\' 2"  (1.575 m), weight 214 lb (97.1 kg), SpO2 99 %. Body mass index is 39.14 kg/m.  General: Cooperative, alert, well developed, in no acute distress. HEENT: Conjunctivae and lids unremarkable. Cardiovascular: Regular rhythm.  Lungs: Normal work of breathing. Neurologic: No focal deficits.   Lab Results  Component Value Date   CREATININE 1.86 (H) 02/26/2021   BUN 21 02/26/2021   NA 144 02/26/2021   K 4.4 02/26/2021   CL 106 02/26/2021   CO2 23 02/26/2021   Lab Results  Component Value Date   ALT 33 (H) 06/16/2020   AST 25 06/16/2020   ALKPHOS 175 (H) 06/16/2020   BILITOT 0.3 06/16/2020   Lab Results  Component Value Date   HGBA1C 6.3 (H) 06/16/2020   HGBA1C 7.9 (H) 09/01/2017   HGBA1C 5.3 04/01/2017   HGBA1C 5.8 (H) 09/05/2015   HGBA1C 6.8 (H) 11/21/2014   Lab Results  Component Value Date   INSULIN 14.1 06/16/2020   Lab Results  Component Value Date   TSH 3.38 01/18/2020  Lab Results  Component Value Date   CHOL 92 (L) 06/16/2020   HDL 49 06/16/2020   LDLCALC 21 06/16/2020   TRIG 128 06/16/2020   CHOLHDL 1.9 01/18/2020   Lab Results  Component Value Date   VD25OH 25.9 (L) 06/16/2020   VD25OH 40.8 04/05/2018   Lab Results  Component Value Date   WBC 5.4 02/26/2021   HGB 11.9 02/26/2021   HCT 37.3 02/26/2021   MCV 95 02/26/2021   PLT 218 02/26/2021   Lab Results  Component Value Date   IRON 7 (L) 08/31/2017    TIBC 164 (L) 08/31/2017   FERRITIN 27 11/29/2014   Attestation Statements:   Reviewed by clinician on day of visit: allergies, medications, problem list, medical history, surgical history, family history, social history, and previous encounter notes.  Time spent on visit including pre-visit chart review and post-visit care and charting was 32 minutes.    I, Trixie Dredge, am acting as transcriptionist for Dennard Nip, MD.  I have reviewed the above documentation for accuracy and completeness, and I agree with the above. -  Dennard Nip, MD

## 2021-05-12 NOTE — Therapy (Signed)
OUTPATIENT PHYSICAL THERAPY TREATMENT NOTE   Patient Name: [name] MRN: [id number] DOB:12-25-1955, 65 y.o., female Today's Date: 05/12/2021  PCP: Ann Held, DO REFERRING PROVIDER: Ann Held, *   PT End of Session - 05/12/21 1603     Visit Number 2    Number of Visits 17    Date for PT Re-Evaluation 07/03/21    Authorization Type MCR    Progress Note Due on Visit 10    PT Start Time 1604    PT Stop Time 7510    PT Time Calculation (min) 41 min    Activity Tolerance Patient tolerated treatment well    Behavior During Therapy [facility] for tasks assessed/performed             Past Medical History:  Diagnosis Date   Adrenal adenoma, left 02/08/2016   CT: stable benign   Anemia in neoplastic disease    Back pain    Benign essential HTN    Breast cancer, left ([place]) dx 10-30-2015  oncologist-  dr Ernst Spell gorsuch   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+, HER2 negative/  12-11-2015 bilateral mastecotmy w/ reconstruction (no radiation and no chemo)   Cancer of corpus uteri, except isthmus [facility])  oncologist-- dr Denman [name] and dr Alvy Bimler    10-15-2004  dx endometroid endometrial and ovarian cancer s/p  chemotheapy and surgery(TAH w/ BSO) :  recurrent 11-19-2014 post pelvic surgery and radiation 01-29-2015 to 03-10-2015   Chronic idiopathic neutropenia ([place])    presumed related to chemotherapy March 2006--- followed by dr Alvy Bimler (treatment w/ G-CSF injections   Chronic nausea    Chronic pain    perineal/ anal  area from bladder pad irritates skin , right flank pain   CKD stage G2/A3, GFR 60-89 and albumin creatinine ratio >300 mg/g    nephrologist-  dr Madelon Lips   Colovesical fistula    Diabetic retinopathy, background ([place])    Difficult intravenous access    small veins--- hx PICC lines   DM type 2 (diabetes mellitus, type 2) ([place])    monitored by dr Legrand Como altheimer   Dysuria    Environmental and seasonal allergies     Fatty liver 02/08/2016   CT   Generalized muscle weakness    GERD (gastroesophageal reflux disease)    Hiatal hernia    History of abdominal abscess 04/16/2017   post surgery 04-01-2017  --- resolved 10/ 2018   History of gastric polyp    2014  duodenum   History of ileus 04/16/2017   resolved w/ no surgical intervention   History of radiation therapy    01-29-2015 to 03-10-2015  pelvis 50.4Gy   Hypothyroidism    monitored by dr Legrand Como altheimer   IBS (irritable bowel syndrome)    Ileostomy in place ([place]) 04/01/2017   created at same time colostomy takedown.   Joint pain    Leg edema    Lower urinary tract symptoms (LUTS)    urge urinary  incontinence   Mixed dyslipidemia    Multiple thyroid nodules    Managed by Dr. Harlow Asa   Nephrostomy status [facility])    Palpitations    Pelvic abscess in female 04/16/2017   PONV (postoperative nausea and vomiting)    "scopolamine patch works for me"   Radiation-induced dermatitis    contact dermatitis , radiation completed, rash only on ankles now.   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   Vitamin D deficiency    Wears glasses    Past Surgical History:  Procedure Laterality Date   APPENDECTOMY     biopsy thyroid nodules     BREAST RECONSTRUCTION WITH PLACEMENT OF TISSUE EXPANDER AND FLEX HD (ACELLULAR HYDRATED DERMIS) Bilateral 12/11/2015   Procedure: BILATERAL BREAST RECONSTRUCTION WITH PLACEMENT OF TISSUE EXPANDERS;  Surgeon: Irene Limbo, MD;  Location: [facility];  Service: Plastics;  Laterality: Bilateral;   COLONOSCOPY WITH PROPOFOL N/A 08/21/2013   Procedure: COLONOSCOPY WITH PROPOFOL;  Surgeon: Cleotis Nipper, MD;  [facility];  Service: Endoscopy;  Laterality: N/A;   COLOSTOMY TAKEDOWN N/A 12/04/2014   Procedure: LAPROSCOPIC LYSIS OF ADHESIONS, SPLENIC  MOBILIZATION, RELOCATION OF COLOSTOMY, DEBRIDEMENT INITIAL COLOSTOMY SITE;  Surgeon: Michael [name], MD;  [facility];  Service: General;  Laterality: N/A;   CYSTOGRAM N/A 06/01/2017   Procedure: CYSTOGRAM;  Surgeon: Alexis Frock, MD;  [facility];  Service: Urology;  Laterality: N/A;   CYSTOSCOPY W/ RETROGRADES Right 11/21/2015   Procedure: CYSTOSCOPY WITH RETROGRADE PYELOGRAM;  Surgeon: Alexis Frock, MD;  [facility];  Service: Urology;  Laterality: Right;   CYSTOSCOPY W/ URETERAL STENT PLACEMENT Right 11/21/2015   Procedure: CYSTOSCOPY WITH STENT REPLACEMENT;  Surgeon: Alexis Frock, MD;  [facility];  Service: Urology;  Laterality: Right;   CYSTOSCOPY W/ URETERAL STENT PLACEMENT Right 03/10/2016   Procedure: CYSTOSCOPY WITH STENT REPLACEMENT;  Surgeon: Alexis Frock, MD;  [facility];  Service: Urology;  Laterality: Right;   CYSTOSCOPY W/ URETERAL STENT PLACEMENT Right 06/30/2016   Procedure: CYSTOSCOPY WITH RETROGRADE PYELOGRAM/URETERAL STENT EXCHANGE;  Surgeon: Alexis Frock, MD;  Location: [facility];  Service: Urology;  Laterality: Right;   CYSTOSCOPY W/ URETERAL STENT PLACEMENT N/A 06/01/2017   Procedure: CYSTOSCOPY WITH EXAM UNDER ANESTHESIA;  Surgeon: Alexis Frock, MD;  [facility];  Service: Urology;  Laterality: N/A;   CYSTOSCOPY W/ URETERAL STENT PLACEMENT Right 08/17/2017   Procedure: CYSTOSCOPY WITH RETROGRADE PYELOGRAM/URETERAL STENT REMOVAL;  Surgeon: Alexis Frock, MD;  Location: [facility];  Service: Urology;  Laterality: Right;   CYSTOSCOPY WITH RETROGRADE PYELOGRAM, URETEROSCOPY AND STENT PLACEMENT Right 03/20/2015   Procedure: CYSTOSCOPY WITH RETROGRADE PYELOGRAM, URETEROSCOPY WITH BALLOON DILATION AND STENT PLACEMENT ON RIGHT;  Surgeon: Alexis Frock, MD;  Location: [facility];  Service: Urology;  Laterality: Right;   CYSTOSCOPY WITH RETROGRADE PYELOGRAM, URETEROSCOPY AND  STENT PLACEMENT Right 05/02/2015   Procedure: CYSTOSCOPY WITH RIGHT RETROGRADE PYELOGRAM,  DIAGNOSTIC URETEROSCOPY AND STENT PULL ;  Surgeon: Alexis Frock, MD;  Location: [facility];  Service: Urology;  Laterality: Right;   CYSTOSCOPY WITH RETROGRADE PYELOGRAM, URETEROSCOPY AND STENT PLACEMENT Right 09/05/2015   Procedure: CYSTOSCOPY WITH RETROGRADE PYELOGRAM,  AND STENT PLACEMENT;  Surgeon: Alexis Frock, MD;  [facility];  Service: Urology;  Laterality: Right;   CYSTOSCOPY WITH RETROGRADE PYELOGRAM, URETEROSCOPY AND STENT PLACEMENT Right 04/01/2017   Procedure: CYSTOSCOPY WITH RETROGRADE PYELOGRAM, URETEROSCOPY AND STENT PLACEMENT;  Surgeon: Alexis Frock, MD;  [facility];  Service: Urology;  Laterality: Right;   CYSTOSCOPY WITH STENT PLACEMENT Right 10/27/2016   Procedure: CYSTOSCOPY WITH STENT CHANGE and right retrograde pyelogram;  Surgeon: Alexis Frock, MD;  [facility];  Service: Urology;  Laterality: Right;   EUS N/A 10/02/2014   Procedure: LOWER ENDOSCOPIC ULTRASOUND (EUS);  Surgeon: Arta Silence, MD;  [facility];  Service: Endoscopy;  Laterality: N/A;   EXCISION SOFT TISSUE MASS RIGHT FOREMAN  12-08-2006   EYE SURGERY  as child   pytosis of eyelids repair   INCISION AND DRAINAGE OF WOUND Bilateral 12/26/2015   Procedure: DEBRIDEMENT OF BILATERAL MASTECTOMY FLAPS;  Surgeon: Irene Limbo, MD;  Location: [facility];  Service: Plastics;  Laterality: Bilateral;   IR CV LINE INJECTION  05/31/2017   IR FLUORO GUIDE CV LINE LEFT  05/31/2017   IR FLUORO GUIDE CV LINE RIGHT  04/06/2017   IR FLUORO GUIDE CV MIDLINE PICC RIGHT  05/30/2017   IR NEPHROSTOGRAM LEFT INITIAL PLACEMENT  09/02/2017   IR NEPHROSTOGRAM LEFT THRU EXISTING ACCESS  11/29/2017   IR NEPHROSTOGRAM RIGHT INITIAL PLACEMENT  09/02/2017   IR NEPHROSTOGRAM RIGHT THRU EXISTING ACCESS  09/13/2017   IR NEPHROSTOGRAM RIGHT THRU EXISTING ACCESS  11/29/2017   IR  NEPHROSTOMY EXCHANGE LEFT  11/28/2017   IR NEPHROSTOMY EXCHANGE LEFT  01/05/2018   IR NEPHROSTOMY EXCHANGE LEFT  02/16/2018   IR NEPHROSTOMY EXCHANGE LEFT  03/30/2018   IR NEPHROSTOMY EXCHANGE LEFT  05/12/2018   IR NEPHROSTOMY EXCHANGE LEFT  06/21/2018   IR NEPHROSTOMY EXCHANGE LEFT  08/04/2018   IR NEPHROSTOMY EXCHANGE LEFT  09/18/2018   IR NEPHROSTOMY EXCHANGE LEFT  10/09/2018   IR NEPHROSTOMY EXCHANGE LEFT  10/27/2018   IR NEPHROSTOMY EXCHANGE LEFT  11/21/2018   IR NEPHROSTOMY EXCHANGE LEFT  01/05/2019   IR NEPHROSTOMY EXCHANGE LEFT  02/15/2019   IR NEPHROSTOMY EXCHANGE LEFT  03/29/2019   IR NEPHROSTOMY EXCHANGE RIGHT  10/02/2017   IR NEPHROSTOMY EXCHANGE RIGHT  11/28/2017   IR NEPHROSTOMY EXCHANGE RIGHT  01/05/2018   IR NEPHROSTOMY EXCHANGE RIGHT  02/16/2018   IR NEPHROSTOMY EXCHANGE RIGHT  03/30/2018   IR NEPHROSTOMY EXCHANGE RIGHT  05/12/2018   IR NEPHROSTOMY EXCHANGE RIGHT  06/21/2018   IR NEPHROSTOMY EXCHANGE RIGHT  08/04/2018   IR NEPHROSTOMY EXCHANGE RIGHT  09/18/2018   IR NEPHROSTOMY EXCHANGE RIGHT  10/27/2018   IR NEPHROSTOMY EXCHANGE RIGHT  11/21/2018   IR NEPHROSTOMY EXCHANGE RIGHT  01/05/2019   IR NEPHROSTOMY EXCHANGE RIGHT  02/15/2019   IR NEPHROSTOMY EXCHANGE RIGHT  03/29/2019   IR NEPHROSTOMY PLACEMENT LEFT  10/02/2017   IR RADIOLOGIST EVAL & MGMT  05/03/2017   IR US GUIDE VASC ACCESS LEFT  05/31/2017   IR US GUIDE VASC ACCESS RIGHT  04/06/2017   IR US GUIDE VASC ACCESS RIGHT  05/30/2017   LAPAROSCOPIC CHOLECYSTECTOMY  1990   LIPOSUCTION WITH LIPOFILLING Bilateral 04/16/2016   Procedure: LIPOSUCTION WITH LIPOFILLING TO BILATERAL CHEST;  Surgeon: Irene Limbo, MD;  Location: [place];  Service: Plastics;  Laterality: Bilateral;   MASTECTOMY W/ SENTINEL NODE BIOPSY Bilateral 12/11/2015   Procedure: RIGHT PROPHYLACTIC MASTECTOMY, LEFT TOTAL MASTECTOMY WITH LEFT AXILLARY SENTINEL LYMPH NODE BIOPSY;  Surgeon: Stark Klein, MD;  Location: [place];  Service: General;  Laterality:  Bilateral;   OSTOMY N/A 11/19/2014   Procedure: OSTOMY;  Surgeon: Michael [name], MD;  [facility];  Service: General;  Laterality: N/A;   PROCTOSCOPY N/A 04/01/2017   Procedure: RIDGE PROCTOSCOPY;  Surgeon: Michael [name], MD;  [facility];  Service: General;  Laterality: N/A;   REMOVAL OF BILATERAL TISSUE EXPANDERS WITH PLACEMENT OF BILATERAL BREAST IMPLANTS Bilateral 04/16/2016   Procedure: REMOVAL OF BILATERAL TISSUE EXPANDERS WITH PLACEMENT OF BILATERAL BREAST IMPLANTS;  Surgeon: Irene Limbo, MD;  Location: [place];  Service: Plastics;  Laterality: Bilateral;  ROBOTIC ASSISTED LAP VAGINAL HYSTERECTOMY N/A 11/19/2014   Procedure: ROBOTIC LYSIS OF ADHESIONS, CONVERTED TO LAPAROTOMY RADICAL UPPER VAGINECTOMY,LOW ANTERIOR BOWEL RESECTION, COLOSTOMY, BILATERAL URETERAL STENT PLACEMENT AND CYSTONOMY CLOSURE;  Surgeon: Everitt Amber, MD;  [facility];  Service: Gynecology;  Laterality: N/A;   TISSUE EXPANDER FILLING Bilateral 12/26/2015   Procedure: EXPANSION OF BILATERAL CHEST TISSUE EXPANDERS (60 mL- Right; 75 mL- Left);  Surgeon: Irene Limbo, MD;  Location: [place];  Service: Plastics;  Laterality: Bilateral;   TONSILLECTOMY     TOTAL ABDOMINAL HYSTERECTOMY  March 2006   [facility]   and Bilateral Salpingoophorectomy/  staging for Ovarian cancer/  an   XI ROBOTIC ASSISTED LOWER ANTERIOR RESECTION N/A 04/01/2017   Procedure: XI ROBOTIC VS LAPAROSCOPIC COLOSTOMY TAKEDOWN WITH LYSIS OF ADHESIONS.;  Surgeon: Michael [name], MD;  [facility];  Service: General;  Laterality: N/A;  ERAS PATHWAY   Patient Active Problem List   Diagnosis Date Noted   DM (diabetes mellitus) type II, controlled, with peripheral vascular disorder ([place]) 01/20/2021   Essential (hemorrhagic) thrombocythemia ([place]) 01/20/2021   First degree heart block 01/18/2020   Paroxysmal atrial fibrillation ([place]) 01/18/2020   Swelling of right parotid gland 10/03/2019   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   Chronic pain    SBO (small bowel obstruction) ([place]) 01/17/2019   Depression 01/16/2019   Other chronic postprocedural pain 05/25/2018   Malignant neoplasm of uterus ([place]) 05/25/2018   Recurrent UTI 03/08/2018   Acute pyelonephritis 11/29/2017   HLD (hyperlipidemia) 11/29/2017   GERD (gastroesophageal reflux disease) 11/29/2017   Type II diabetes mellitus with renal manifestations ([place]) 11/29/2017   Encounter for palliative care    Preventative health care    Poor venous access    Acute renal failure superimposed on chronic kidney disease ([place]) 10/02/2017   Decubitus ulcer of sacral region, stage 2 ([place]) 73/42/8768   Metabolic acidosis 11/57/2620   MDD (major depressive disorder), single episode, moderate ([place])    Poorly controlled diabetes mellitus ([place]) 08/31/2017   Pressure injury of skin 08/31/2017   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 08/31/2017   History of external beam radiation therapy to pelvis 08/31/2017   ARF (acute renal failure) ([place]) 08/30/2017   Adjustment disorder with mixed anxiety and depressed mood 08/30/2017   Dehydration 08/29/2017   CKD (chronic kidney disease), stage III ([place]) 08/24/2017   Wound of right buttock 08/10/2017   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[place]) 06/20/2017   Postoperative anemia 04/04/2017   Pelvic cancer s/p colostomy takedown/loop ileostomy diversion 04/01/2017 04/01/2017   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 04/01/2017   Hot flashes related to aromatase inhibitor therapy 03/18/2017   Ureteral stricture, right, 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) 04/01/2017 09/10/2016   Vitamin D insufficiency 08/30/2016   Genetic testing 11/27/2015   Cancer of corpus uteri, except isthmus ([place]) 11/13/2015   Breast cancer of upper-inner quadrant of left female breast ([place])    Pelvic pain in female 03/19/2015   Chronic anemia 12/30/2014   UTI (urinary tract infection)    Pancytopenia, acquired ([place]) 11/26/2014    Class 2 severe obesity with serious comorbidity and body mass index (BMI) of 38.0 to 38.9 in adult [facility]) 11/20/2014   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 s/p resection/partial vaginectomy/ LAR/colostomy 11/19/2014 11/19/2014   Abdominal pain 09/09/2014   Postmenopausal bleeding 09/05/2014   History of ovarian & endometrial cancer 07/17/2012   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[place]) 05/10/2012   Chronic neutropenia ([place]) 12/14/2011   Primary  hypothyroidism 12/14/2011   DM type 2 (diabetes mellitus, type 2) ([place]) 12/14/2011   Benign essential HTN 12/14/2011    REFERRING DIAG: M54.50,G89.29 (ICD-10-CM) - Chronic bilateral low back pain without sciatica   THERAPY DIAG:  Chronic left-sided low back pain without sciatica  Muscle weakness (generalized)  PERTINENT HISTORY: H/o CA, ostomy bag, radiation to pelvis, can't sweat due to skin breakdown, reports known LLD, multiple abdomina surgeries  PRECAUTIONS: Other: h/o CA, ostomy bag, cannot sweat d/t skin breakdown  SUBJECTIVE: Pt states she did not sleep well last night after her white cell injection (every 6 days). She states that she had more pain after the home exercise. She took a muscle relaxer before coming in today. Pt reports the L LE heel lift makes the walking better but the L knee is more painful.   PAIN:  Are you having pain? Yes VAS scale: 2/10 Pain location: L L/S and L ant hip Pain orientation: Left  Pain description: dull    PATIENT GOALS decrease struggle to get up in the AM- pain on Lt buttock; lift Lt leg without it hurting, have to sit and stretch for several minutes in the AM before I can get up, improve balance  OBJECTIVE:    TODAY'S TREATMENT:  10/11  TRA brace with pursed lip exhale due to holding breath 3s 10x PPT 2x10 2s with cuing for diaphrag breathing Supine clamshell RTB 2x10 3s with TrA contraction [place] curl up 10x 2s hold Bridge 10x     PATIENT EDUCATION:  Education  details: anatomy, exercise progression, DOMS expectations, muscle firing, envelope of function, HEP, POC   Person educated: Patient Education method: Explanation, Demonstration, Tactile cues, and Verbal cues, Handout Education comprehension: verbalized understanding, returned demonstration, verbal cues required, tactile cues required, and needs further education     HOME EXERCISE PROGRAM:  Pt demonstrates L hip isometric flexion exercise today.   Access Code: [facility] URL: [email or web address] Date: 05/12/2021 Prepared by: Daleen Bo  Exercises Supine Transversus Abdominis Bracing - Hands on Stomach - 2 x daily - 7 x weekly - 1 sets - 10 reps - 3 hold Supine Posterior Pelvic Tilt - 2 x daily - 7 x weekly - 2 sets - 10 reps - 2 hold Hooklying Clamshell with Resistance - 2 x daily - 7 x weekly - 1 sets - 15 reps   Eval: "Something under feet, pillow for lumbar support, abdominal engagement seated, try heel lift (2 layer Rt shoe)"     ASSESSMENT:   CLINICAL IMPRESSION: Pt with increased report of pain with home exercise so regressed to more gentle abdominal activation. Pt does demonstrate poor TrA motor control with inability to throttle contraction up and down, will need review and continued cuing. Pt required cuing for diaphragmatic breathing in order prevent excessive intraabdominal pressure. Pt HEP updated to include gentle hip strengthening and given edu about removal of heel lift shoulder L anterior knee pain continue. Plan to review HEP and proceed with progression of exercise at next session. Test balance as time allows. Pt would benefit from continued skilled therapy in order to reach goals and maximize functional  lumbopelvic strength and ROM for prevention of further functional decline.      REHAB POTENTIAL: Fair     CLINICAL DECISION MAKING: Unstable/unpredictable   EVALUATION COMPLEXITY: Moderate     GOALS: Goals reviewed with patient? Yes   SHORT  TERM GOALS:   STG Name Target Date Goal status  1 Pt will verbalize ability to obtain  a gentle core contraction in daily, resting postures Baseline: difficult at eval but was able to recognize with cuing  05/29/21 INITIAL  2 Pt will begin working on a daily walking program with knowledge of limitations to avoid sweating and impacting skin Baseline: will discuss and progress as appropriate 05/29/21 INITIAL  3 Pt will be independent in short term HEP as it has been established Baseline:will progress as appropriate 05/29/21 INITIAL  4 Pt will be ready to transition to aquatic exercises Baseline: multiple considerations to take into account 05/29/21 INITIAL                               LONG TERM GOALS:  to be set at short term re-evaluation based on response to exercises   LTG Name Target Date Goal status  _0 PLAN: PT FREQUENCY: 2x/week   PT DURATION: 8 weeks   PLANNED INTERVENTIONS: Therapeutic exercises, Therapeutic activity, Neuro Muscular re-education, Balance training, Gait training, Patient/Family education, Joint mobilization, Stair training, Aquatic Therapy, Cryotherapy, Moist heat, Taping, and Manual therapy   PLAN FOR NEXT SESSION: review HEP, test 4 stage balance    Daleen Bo PT, DPT 05/12/21 4:47 PM

## 2021-05-15 ENCOUNTER — Ambulatory Visit (HOSPITAL_BASED_OUTPATIENT_CLINIC_OR_DEPARTMENT_OTHER): Payer: BC Managed Care – PPO | Admitting: Physical Therapy

## 2021-05-15 ENCOUNTER — Encounter (HOSPITAL_BASED_OUTPATIENT_CLINIC_OR_DEPARTMENT_OTHER): Payer: Self-pay | Admitting: Physical Therapy

## 2021-05-15 ENCOUNTER — Other Ambulatory Visit: Payer: Self-pay

## 2021-05-15 DIAGNOSIS — G8929 Other chronic pain: Secondary | ICD-10-CM

## 2021-05-15 DIAGNOSIS — M545 Low back pain, unspecified: Secondary | ICD-10-CM

## 2021-05-15 DIAGNOSIS — M6281 Muscle weakness (generalized): Secondary | ICD-10-CM

## 2021-05-15 NOTE — Therapy (Signed)
OUTPATIENT PHYSICAL THERAPY TREATMENT NOTE   Patient Name: [name] MRN: [id number] DOB:[birth year], 65 y.o., female Today's Date: 05/15/2021  PCP: Ann Held, DO REFERRING PROVIDER: Ann Held, *   PT End of Session - 05/15/21 0849     Visit Number 3    Number of Visits 17    Date for PT Re-Evaluation 07/03/21    Authorization Type MCR    Progress Note Due on Visit 10    PT Start Time 0845    PT Stop Time 0923    PT Time Calculation (min) 38 min    Activity Tolerance Patient tolerated treatment well    Behavior During Therapy [facility] for tasks assessed/performed             Past Medical History:  Diagnosis Date   Adrenal adenoma, left 02/08/2016   CT: stable benign   Anemia in neoplastic disease    Back pain    Benign essential HTN    Breast cancer, left ([place]) dx 10-30-2015  oncologist-  dr Ernst Spell gorsuch   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+, HER2 negative/  12-11-2015 bilateral mastecotmy w/ reconstruction (no radiation and no chemo)   Cancer of corpus uteri, except isthmus [facility])  oncologist-- dr Denman [name] and dr Alvy Bimler    10-15-2004  dx endometroid endometrial and ovarian cancer s/p  chemotheapy and surgery(TAH w/ BSO) :  recurrent 11-19-2014 post pelvic surgery and radiation 01-29-2015 to 03-10-2015   Chronic idiopathic neutropenia ([place])    presumed related to chemotherapy March 2006--- followed by dr Alvy Bimler (treatment w/ G-CSF injections   Chronic nausea    Chronic pain    perineal/ anal  area from bladder pad irritates skin , right flank pain   CKD stage G2/A3, GFR 60-89 and albumin creatinine ratio >300 mg/g    nephrologist-  dr Madelon Lips   Colovesical fistula    Diabetic retinopathy, background ([place])    Difficult intravenous access    small veins--- hx PICC lines   DM type 2 (diabetes mellitus, type 2) ([place])    monitored by dr Legrand Como altheimer   Dysuria    Environmental and seasonal allergies     Fatty liver 02/08/2016   CT   Generalized muscle weakness    GERD (gastroesophageal reflux disease)    Hiatal hernia    History of abdominal abscess 04/16/2017   post surgery 04-01-2017  --- resolved 10/ 2018   History of gastric polyp    2014  duodenum   History of ileus 04/16/2017   resolved w/ no surgical intervention   History of radiation therapy    01-29-2015 to 03-10-2015  pelvis 50.4Gy   Hypothyroidism    monitored by dr Legrand Como altheimer   IBS (irritable bowel syndrome)    Ileostomy in place ([place]) 04/01/2017   created at same time colostomy takedown.   Joint pain    Leg edema    Lower urinary tract symptoms (LUTS)    urge urinary  incontinence   Mixed dyslipidemia    Multiple thyroid nodules    Managed by Dr. Harlow Asa   Nephrostomy status [facility])    Palpitations    Pelvic abscess in female 04/16/2017   PONV (postoperative nausea and vomiting)    "scopolamine patch works for me"   Radiation-induced dermatitis    contact dermatitis , radiation completed, rash only on ankles now.   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   Vitamin D deficiency    Wears glasses    Past Surgical History:  Procedure Laterality Date   APPENDECTOMY     biopsy thyroid nodules     BREAST RECONSTRUCTION WITH PLACEMENT OF TISSUE EXPANDER AND FLEX HD (ACELLULAR HYDRATED DERMIS) Bilateral 12/11/2015   Procedure: BILATERAL BREAST RECONSTRUCTION WITH PLACEMENT OF TISSUE EXPANDERS;  Surgeon: Irene Limbo, MD;  Location: [place];  Service: Plastics;  Laterality: Bilateral;   COLONOSCOPY WITH PROPOFOL N/A 08/21/2013   Procedure: COLONOSCOPY WITH PROPOFOL;  Surgeon: Cleotis Nipper, MD;  [facility];  Service: Endoscopy;  Laterality: N/A;   COLOSTOMY TAKEDOWN N/A 12/04/2014   Procedure: LAPROSCOPIC LYSIS OF ADHESIONS, SPLENIC  MOBILIZATION, RELOCATION OF COLOSTOMY, DEBRIDEMENT INITIAL COLOSTOMY SITE;  Surgeon: Michael [name], MD;  [facility];  Service: General;  Laterality: N/A;   CYSTOGRAM N/A 06/01/2017   Procedure: CYSTOGRAM;  Surgeon: Alexis Frock, MD;  [facility];  Service: Urology;  Laterality: N/A;   CYSTOSCOPY W/ RETROGRADES Right 11/21/2015   Procedure: CYSTOSCOPY WITH RETROGRADE PYELOGRAM;  Surgeon: Alexis Frock, MD;  [facility];  Service: Urology;  Laterality: Right;   CYSTOSCOPY W/ URETERAL STENT PLACEMENT Right 11/21/2015   Procedure: CYSTOSCOPY WITH STENT REPLACEMENT;  Surgeon: Alexis Frock, MD;  [facility];  Service: Urology;  Laterality: Right;   CYSTOSCOPY W/ URETERAL STENT PLACEMENT Right 03/10/2016   Procedure: CYSTOSCOPY WITH STENT REPLACEMENT;  Surgeon: Alexis Frock, MD;  Location[facility];  Service: Urology;  Laterality: Right;   CYSTOSCOPY W/ URETERAL STENT PLACEMENT Right 06/30/2016   Procedure: CYSTOSCOPY WITH RETROGRADE PYELOGRAM/URETERAL STENT EXCHANGE;  Surgeon: Alexis Frock, MD;  Location[facility];  Service: Urology;  Laterality: Right;   CYSTOSCOPY W/ URETERAL STENT PLACEMENT N/A 06/01/2017   Procedure: CYSTOSCOPY WITH EXAM UNDER ANESTHESIA;  Surgeon: Alexis Frock, MD;  [facility];  Service: Urology;  Laterality: N/A;   CYSTOSCOPY W/ URETERAL STENT PLACEMENT Right 08/17/2017   Procedure: CYSTOSCOPY WITH RETROGRADE PYELOGRAM/URETERAL STENT REMOVAL;  Surgeon: Alexis Frock, MD;  Location: [facility];  Service: Urology;  Laterality: Right;   CYSTOSCOPY WITH RETROGRADE PYELOGRAM, URETEROSCOPY AND STENT PLACEMENT Right 03/20/2015   Procedure: CYSTOSCOPY WITH RETROGRADE PYELOGRAM, URETEROSCOPY WITH BALLOON DILATION AND STENT PLACEMENT ON RIGHT;  Surgeon: Alexis Frock, MD;  Location: [facility];  Service: Urology;  Laterality: Right;   CYSTOSCOPY WITH RETROGRADE PYELOGRAM, URETEROSCOPY AND  STENT PLACEMENT Right 05/02/2015   Procedure: CYSTOSCOPY WITH RIGHT RETROGRADE PYELOGRAM,  DIAGNOSTIC URETEROSCOPY AND STENT PULL ;  Surgeon: Alexis Frock, MD;  Location[facility];  Service: Urology;  Laterality: Right;   CYSTOSCOPY WITH RETROGRADE PYELOGRAM, URETEROSCOPY AND STENT PLACEMENT Right 09/05/2015   Procedure: CYSTOSCOPY WITH RETROGRADE PYELOGRAM,  AND STENT PLACEMENT;  Surgeon: Alexis Frock, MD;  [facility];  Service: Urology;  Laterality: Right;   CYSTOSCOPY WITH RETROGRADE PYELOGRAM, URETEROSCOPY AND STENT PLACEMENT Right 04/01/2017   Procedure: CYSTOSCOPY WITH RETROGRADE PYELOGRAM, URETEROSCOPY AND STENT PLACEMENT;  Surgeon: Alexis Frock, MD;  [facility];  Service: Urology;  Laterality: Right;   CYSTOSCOPY WITH STENT PLACEMENT Right 10/27/2016   Procedure: CYSTOSCOPY WITH STENT CHANGE and right retrograde pyelogram;  Surgeon: Alexis Frock, MD;  Location: [facility];  Service: Urology;  Laterality: Right;   EUS N/A 10/02/2014   Procedure: LOWER ENDOSCOPIC ULTRASOUND (EUS);  Surgeon: Arta Silence, MD;  [facility];  Service: Endoscopy;  Laterality: N/A;   EXCISION SOFT TISSUE MASS RIGHT FOREMAN  12-08-2006   EYE SURGERY  as child   pytosis of eyelids repair   INCISION AND DRAINAGE OF WOUND Bilateral 12/26/2015   Procedure: DEBRIDEMENT OF BILATERAL MASTECTOMY FLAPS;  Surgeon: Irene Limbo, MD;  Location: [place];  Service: Plastics;  Laterality: Bilateral;   IR CV LINE INJECTION  05/31/2017   IR FLUORO GUIDE CV LINE LEFT  05/31/2017   IR FLUORO GUIDE CV LINE RIGHT  04/06/2017   IR FLUORO GUIDE CV MIDLINE PICC RIGHT  05/30/2017   IR NEPHROSTOGRAM LEFT INITIAL PLACEMENT  09/02/2017   IR NEPHROSTOGRAM LEFT THRU EXISTING ACCESS  11/29/2017   IR NEPHROSTOGRAM RIGHT INITIAL PLACEMENT  09/02/2017   IR NEPHROSTOGRAM RIGHT THRU EXISTING ACCESS  09/13/2017   IR NEPHROSTOGRAM RIGHT THRU EXISTING ACCESS  11/29/2017   IR  NEPHROSTOMY EXCHANGE LEFT  11/28/2017   IR NEPHROSTOMY EXCHANGE LEFT  01/05/2018   IR NEPHROSTOMY EXCHANGE LEFT  02/16/2018   IR NEPHROSTOMY EXCHANGE LEFT  03/30/2018   IR NEPHROSTOMY EXCHANGE LEFT  05/12/2018   IR NEPHROSTOMY EXCHANGE LEFT  06/21/2018   IR NEPHROSTOMY EXCHANGE LEFT  08/04/2018   IR NEPHROSTOMY EXCHANGE LEFT  09/18/2018   IR NEPHROSTOMY EXCHANGE LEFT  10/09/2018   IR NEPHROSTOMY EXCHANGE LEFT  10/27/2018   IR NEPHROSTOMY EXCHANGE LEFT  11/21/2018   IR NEPHROSTOMY EXCHANGE LEFT  01/05/2019   IR NEPHROSTOMY EXCHANGE LEFT  02/15/2019   IR NEPHROSTOMY EXCHANGE LEFT  03/29/2019   IR NEPHROSTOMY EXCHANGE RIGHT  10/02/2017   IR NEPHROSTOMY EXCHANGE RIGHT  11/28/2017   IR NEPHROSTOMY EXCHANGE RIGHT  01/05/2018   IR NEPHROSTOMY EXCHANGE RIGHT  02/16/2018   IR NEPHROSTOMY EXCHANGE RIGHT  03/30/2018   IR NEPHROSTOMY EXCHANGE RIGHT  05/12/2018   IR NEPHROSTOMY EXCHANGE RIGHT  06/21/2018   IR NEPHROSTOMY EXCHANGE RIGHT  08/04/2018   IR NEPHROSTOMY EXCHANGE RIGHT  09/18/2018   IR NEPHROSTOMY EXCHANGE RIGHT  10/27/2018   IR NEPHROSTOMY EXCHANGE RIGHT  11/21/2018   IR NEPHROSTOMY EXCHANGE RIGHT  01/05/2019   IR NEPHROSTOMY EXCHANGE RIGHT  02/15/2019   IR NEPHROSTOMY EXCHANGE RIGHT  03/29/2019   IR NEPHROSTOMY PLACEMENT LEFT  10/02/2017   IR RADIOLOGIST EVAL & MGMT  05/03/2017   IR US GUIDE VASC ACCESS LEFT  05/31/2017   IR US GUIDE VASC ACCESS RIGHT  04/06/2017   IR US GUIDE VASC ACCESS RIGHT  05/30/2017   LAPAROSCOPIC CHOLECYSTECTOMY  1990   LIPOSUCTION WITH LIPOFILLING Bilateral 04/16/2016   Procedure: LIPOSUCTION WITH LIPOFILLING TO BILATERAL CHEST;  Surgeon: Irene Limbo, MD;  Location: [facility];  Service: Plastics;  Laterality: Bilateral;   MASTECTOMY W/ SENTINEL NODE BIOPSY Bilateral 12/11/2015   Procedure: RIGHT PROPHYLACTIC MASTECTOMY, LEFT TOTAL MASTECTOMY WITH LEFT AXILLARY SENTINEL LYMPH NODE BIOPSY;  Surgeon: Stark Klein, MD;  Location: [facility];  Service: General;  Laterality:  Bilateral;   OSTOMY N/A 11/19/2014   Procedure: OSTOMY;  Surgeon: Michael [name], MD;  [facility];  Service: General;  Laterality: N/A;   PROCTOSCOPY N/A 04/01/2017   Procedure: RIDGE PROCTOSCOPY;  Surgeon: Michael [name], MD;  [facility];  Service: General;  Laterality: N/A;   REMOVAL OF BILATERAL TISSUE EXPANDERS WITH PLACEMENT OF BILATERAL BREAST IMPLANTS Bilateral 04/16/2016   Procedure: REMOVAL OF BILATERAL TISSUE EXPANDERS WITH PLACEMENT OF BILATERAL BREAST IMPLANTS;  Surgeon: Irene Limbo, MD;  Location: [place];  Service: Plastics;  Laterality: Bilateral;  ROBOTIC ASSISTED LAP VAGINAL HYSTERECTOMY N/A 11/19/2014   Procedure: ROBOTIC LYSIS OF ADHESIONS, CONVERTED TO LAPAROTOMY RADICAL UPPER VAGINECTOMY,LOW ANTERIOR BOWEL RESECTION, COLOSTOMY, BILATERAL URETERAL STENT PLACEMENT AND CYSTONOMY CLOSURE;  Surgeon: Everitt Amber, MD;  [facility];  Service: Gynecology;  Laterality: N/A;   TISSUE EXPANDER FILLING Bilateral 12/26/2015   Procedure: EXPANSION OF BILATERAL CHEST TISSUE EXPANDERS (60 mL- Right; 75 mL- Left);  Surgeon: Irene Limbo, MD;  Location: [place];  Service: Plastics;  Laterality: Bilateral;   TONSILLECTOMY     TOTAL ABDOMINAL HYSTERECTOMY  March 2006   [facility]   and Bilateral Salpingoophorectomy/  staging for Ovarian cancer/  an   XI ROBOTIC ASSISTED LOWER ANTERIOR RESECTION N/A 04/01/2017   Procedure: XI ROBOTIC VS LAPAROSCOPIC COLOSTOMY TAKEDOWN WITH LYSIS OF ADHESIONS.;  Surgeon: Michael [name], MD;  [facility];  Service: General;  Laterality: N/A;  ERAS PATHWAY   Patient Active Problem List   Diagnosis Date Noted   DM (diabetes mellitus) type II, controlled, with peripheral vascular disorder ([place]) 01/20/2021   Essential (hemorrhagic) thrombocythemia ([place]) 01/20/2021   First degree heart block 01/18/2020   Paroxysmal atrial fibrillation ([place]) 01/18/2020   Swelling of right parotid gland 10/03/2019   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   Chronic pain    SBO (small bowel obstruction) ([place]) 01/17/2019   Depression 01/16/2019   Other chronic postprocedural pain 05/25/2018   Malignant neoplasm of uterus ([place]) 05/25/2018   Recurrent UTI 03/08/2018   Acute pyelonephritis 11/29/2017   HLD (hyperlipidemia) 11/29/2017   GERD (gastroesophageal reflux disease) 11/29/2017   Type II diabetes mellitus with renal manifestations ([place]) 11/29/2017   Encounter for palliative care    Preventative health care    Poor venous access    Acute renal failure superimposed on chronic kidney disease ([place]) 10/02/2017   Decubitus ulcer of sacral region, stage 2 ([place]) 91/47/8295   Metabolic acidosis 62/13/0865   MDD (major depressive disorder), single episode, moderate ([place])    Poorly controlled diabetes mellitus ([place]) 08/31/2017   Pressure injury of skin 08/31/2017   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 08/31/2017   History of external beam radiation therapy to pelvis 08/31/2017   ARF (acute renal failure) ([place]) 08/30/2017   Adjustment disorder with mixed anxiety and depressed mood 08/30/2017   Dehydration 08/29/2017   CKD (chronic kidney disease), stage III ([place]) 08/24/2017   Wound of right buttock 08/10/2017   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[place]) 06/20/2017   Postoperative anemia 04/04/2017   Pelvic cancer s/p colostomy takedown/loop ileostomy diversion 04/01/2017 04/01/2017   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 04/01/2017   Hot flashes related to aromatase inhibitor therapy 03/18/2017   Ureteral stricture, right, 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) 04/01/2017 09/10/2016   Vitamin D insufficiency 08/30/2016   Genetic testing 11/27/2015   Cancer of corpus uteri, except isthmus ([place]) 11/13/2015   Breast cancer of upper-inner quadrant of left female breast ([place])    Pelvic pain in female 03/19/2015   Chronic anemia 12/30/2014   UTI (urinary tract infection)    Pancytopenia, acquired ([place]) 11/26/2014    Class 2 severe obesity with serious comorbidity and body mass index (BMI) of 38.0 to 38.9 in adult [facility]) 11/20/2014   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 s/p resection/partial vaginectomy/ LAR/colostomy 11/19/2014 11/19/2014   Abdominal pain 09/09/2014   Postmenopausal bleeding 09/05/2014   History of ovarian & endometrial cancer 07/17/2012   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[place]) 05/10/2012   Chronic neutropenia ([place]) 12/14/2011   Primary  hypothyroidism 12/14/2011   DM type 2 (diabetes mellitus, type 2) ([place]) 12/14/2011   Benign essential HTN 12/14/2011    REFERRING DIAG: M54.50,G89.29 (ICD-10-CM) - Chronic bilateral low back pain without sciatica   THERAPY DIAG:  Chronic left-sided low back pain without sciatica  Muscle weakness (generalized)  PERTINENT HISTORY: H/o CA, ostomy bag, radiation to pelvis, can't sweat due to skin breakdown, reports known LLD, multiple abdomina surgeries  PRECAUTIONS: Other: h/o CA, ostomy bag, cannot sweat d/t skin breakdown  SUBJECTIVE: Pt states she feels better today from the injection. She denies pain or issues with HEP but did have dizziness that made her walk into her wall. She states she was doing the exercises on the floor.   PAIN:  Are you having pain? no    PATIENT GOALS decrease struggle to get up in the AM- pain on Lt buttock; lift Lt leg without it hurting, have to sit and stretch for several minutes in the AM before I can get up, improve balance  OBJECTIVE:   4 Stage Balance:  NBOS Semi-tandem Tandem SLS TODAY'S TREATMENT:   10/14  Nustep L2 5 min  TRA brace with pursed lip exhale due to holding breath 3s 10x PPT 2x10 2s with cuing for diaphrag breathing Supine clamshell RTB 2x10 3s with TrA contraction McGill curl up 2x10  Bridge with red band at knees 2x10 Standing march 20x  10/11  TRA brace with pursed lip exhale due to holding breath 3s 10x PPT 2x10 2s with cuing for diaphrag breathing Supine  clamshell RTB 2x10 3s with TrA contraction [place] curl up 10x 2s hold Bridge 10x     PATIENT EDUCATION:  Education details: anatomy, exercise progression, DOMS expectations, muscle firing, envelope of function, HEP, POC   Person educated: Patient Education method: Explanation, Demonstration, Tactile cues, and Verbal cues, Handout Education comprehension: verbalized understanding, returned demonstration, verbal cues required, tactile cues required, and needs further education     HOME EXERCISE PROGRAM:  Pt demonstrates L hip isometric flexion exercise today.   Access Code: [facility] URL: [email or web address] Date: 05/12/2021 Prepared by: Daleen Bo   Eval: "Something under feet, pillow for lumbar support, abdominal engagement seated, try heel lift (2 layer Rt shoe)"     ASSESSMENT:   CLINICAL IMPRESSION: Pt able to tolerate progression of volume with HEP and exercise performed during treatment session. Pt able to tolerate glute and abdominal strengthening exercise without pain. Pt did required VC and TC for PPT due to tendency for APT. Pt does demonstrate orthostatic presentation with transition from supine to sitting. Pt advised about home set up with exercise and to perform slow transitions to prevent falls. Pt HEP updated and advised to hold on self balance exercise until safety is assessed in clinic.  Heel lift was not an issue at this session. Pt would benefit from continued skilled therapy in order to reach goals and maximize functional  lumbopelvic strength and ROM for prevention of further functional decline.      REHAB POTENTIAL: Fair     CLINICAL DECISION MAKING: Unstable/unpredictable   EVALUATION COMPLEXITY: Moderate     GOALS: Goals reviewed with patient? Yes   SHORT TERM GOALS:   STG Name Target Date Goal status  1 Pt will verbalize ability to obtain a gentle core contraction in daily, resting postures Baseline: difficult at eval but was able  to recognize with cuing  05/29/21 INITIAL  2 Pt will begin working on a daily walking program with knowledge of limitations to avoid  sweating and impacting skin Baseline: will discuss and progress as appropriate 05/29/21 INITIAL  3 Pt will be independent in short term HEP as it has been established Baseline:will progress as appropriate 05/29/21 INITIAL  4 Pt will be ready to transition to aquatic exercises Baseline: multiple considerations to take into account 05/29/21 INITIAL                               LONG TERM GOALS:  to be set at short term re-evaluation based on response to exercises   LTG Name Target Date Goal status  _0 PLAN: PT FREQUENCY: 2x/week   PT DURATION: 8 weeks   PLANNED INTERVENTIONS: Therapeutic exercises, Therapeutic activity, Neuro Muscular re-education, Balance training, Gait training, Patient/Family education, Joint mobilization, Stair training, Aquatic Therapy, Cryotherapy, Moist heat, Taping, and Manual therapy   PLAN FOR NEXT SESSION: progress lumbopelvic stretching, L hip flexion stretching, introduce standing balance exercise   Daleen Bo PT, DPT 05/15/21 9:28 AM

## 2021-05-18 ENCOUNTER — Encounter (HOSPITAL_BASED_OUTPATIENT_CLINIC_OR_DEPARTMENT_OTHER): Payer: Self-pay | Admitting: Physical Therapy

## 2021-05-18 ENCOUNTER — Other Ambulatory Visit: Payer: Self-pay

## 2021-05-18 ENCOUNTER — Ambulatory Visit (HOSPITAL_BASED_OUTPATIENT_CLINIC_OR_DEPARTMENT_OTHER): Payer: BC Managed Care – PPO | Admitting: Physical Therapy

## 2021-05-18 DIAGNOSIS — M6281 Muscle weakness (generalized): Secondary | ICD-10-CM | POA: Diagnosis not present

## 2021-05-18 DIAGNOSIS — G8929 Other chronic pain: Secondary | ICD-10-CM

## 2021-05-18 DIAGNOSIS — M545 Low back pain, unspecified: Secondary | ICD-10-CM | POA: Diagnosis not present

## 2021-05-18 NOTE — Therapy (Signed)
OUTPATIENT PHYSICAL THERAPY TREATMENT NOTE   Patient Name: [name] MRN: [id number] DOB:Jan 09, 1956, 65 y.o., female Today's Date: 05/18/2021  PCP: Ann Held, DO REFERRING PROVIDER: Ann Held, *   PT End of Session - 05/18/21 1042     Visit Number 4    Number of Visits 17    Date for PT Re-Evaluation 07/03/21    Authorization Type MCR    Progress Note Due on Visit 10    PT Start Time 0930    PT Stop Time 8372    PT Time Calculation (min) 44 min    Activity Tolerance Patient tolerated treatment well    Behavior During Therapy [facility] for tasks assessed/performed              Past Medical History:  Diagnosis Date   Adrenal adenoma, left 02/08/2016   CT: stable benign   Anemia in neoplastic disease    Back pain    Benign essential HTN    Breast cancer, left ([place]) dx 10-30-2015  oncologist-  dr Ernst Spell gorsuch   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+, HER2 negative/  12-11-2015 bilateral mastecotmy w/ reconstruction (no radiation and no chemo)   Cancer of corpus uteri, except isthmus [facility])  oncologist-- dr Denman [name] and dr Alvy Bimler    10-15-2004  dx endometroid endometrial and ovarian cancer s/p  chemotheapy and surgery(TAH w/ BSO) :  recurrent 11-19-2014 post pelvic surgery and radiation 01-29-2015 to 03-10-2015   Chronic idiopathic neutropenia ([place])    presumed related to chemotherapy March 2006--- followed by dr Alvy Bimler (treatment w/ G-CSF injections   Chronic nausea    Chronic pain    perineal/ anal  area from bladder pad irritates skin , right flank pain   CKD stage G2/A3, GFR 60-89 and albumin creatinine ratio >300 mg/g    nephrologist-  dr Madelon Lips   Colovesical fistula    Diabetic retinopathy, background ([place])    Difficult intravenous access    small veins--- hx PICC lines   DM type 2 (diabetes mellitus, type 2) ([place])    monitored by dr Legrand Como altheimer   Dysuria    Environmental and seasonal allergies     Fatty liver 02/08/2016   CT   Generalized muscle weakness    GERD (gastroesophageal reflux disease)    Hiatal hernia    History of abdominal abscess 04/16/2017   post surgery 04-01-2017  --- resolved 10/ 2018   History of gastric polyp    2014  duodenum   History of ileus 04/16/2017   resolved w/ no surgical intervention   History of radiation therapy    01-29-2015 to 03-10-2015  pelvis 50.4Gy   Hypothyroidism    monitored by dr Legrand Como altheimer   IBS (irritable bowel syndrome)    Ileostomy in place ([place]) 04/01/2017   created at same time colostomy takedown.   Joint pain    Leg edema    Lower urinary tract symptoms (LUTS)    urge urinary  incontinence   Mixed dyslipidemia    Multiple thyroid nodules    Managed by Dr. Harlow Asa   Nephrostomy status [facility])    Palpitations    Pelvic abscess in female 04/16/2017   PONV (postoperative nausea and vomiting)    "scopolamine patch works for me"   Radiation-induced dermatitis    contact dermatitis , radiation completed, rash only on ankles now.   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   Vitamin D deficiency    Wears glasses    Past Surgical History:  Procedure Laterality Date   APPENDECTOMY     biopsy thyroid nodules     BREAST RECONSTRUCTION WITH PLACEMENT OF TISSUE EXPANDER AND FLEX HD (ACELLULAR HYDRATED DERMIS) Bilateral 12/11/2015   Procedure: BILATERAL BREAST RECONSTRUCTION WITH PLACEMENT OF TISSUE EXPANDERS;  Surgeon: Irene Limbo, MD;  Location: [place];  Service: Plastics;  Laterality: Bilateral;   COLONOSCOPY WITH PROPOFOL N/A 08/21/2013   Procedure: COLONOSCOPY WITH PROPOFOL;  Surgeon: Cleotis Nipper, MD;  [facility];  Service: Endoscopy;  Laterality: N/A;   COLOSTOMY TAKEDOWN N/A 12/04/2014   Procedure: LAPROSCOPIC LYSIS OF ADHESIONS, SPLENIC  MOBILIZATION, RELOCATION OF COLOSTOMY, DEBRIDEMENT INITIAL COLOSTOMY SITE;  Surgeon: Michael [name], MD;  [facility];  Service: General;  Laterality: N/A;   CYSTOGRAM N/A 06/01/2017   Procedure: CYSTOGRAM;  Surgeon: Alexis Frock, MD;  [facility];  Service: Urology;  Laterality: N/A;   CYSTOSCOPY W/ RETROGRADES Right 11/21/2015   Procedure: CYSTOSCOPY WITH RETROGRADE PYELOGRAM;  Surgeon: Alexis Frock, MD;  [facility];  Service: Urology;  Laterality: Right;   CYSTOSCOPY W/ URETERAL STENT PLACEMENT Right 11/21/2015   Procedure: CYSTOSCOPY WITH STENT REPLACEMENT;  Surgeon: Alexis Frock, MD;  [facility];  Service: Urology;  Laterality: Right;   CYSTOSCOPY W/ URETERAL STENT PLACEMENT Right 03/10/2016   Procedure: CYSTOSCOPY WITH STENT REPLACEMENT;  Surgeon: Alexis Frock, MD;  Location: [facility];  Service: Urology;  Laterality: Right;   CYSTOSCOPY W/ URETERAL STENT PLACEMENT Right 06/30/2016   Procedure: CYSTOSCOPY WITH RETROGRADE PYELOGRAM/URETERAL STENT EXCHANGE;  Surgeon: Alexis Frock, MD;  Location: [facility];  Service: Urology;  Laterality: Right;   CYSTOSCOPY W/ URETERAL STENT PLACEMENT N/A 06/01/2017   Procedure: CYSTOSCOPY WITH EXAM UNDER ANESTHESIA;  Surgeon: Alexis Frock, MD;  [facility];  Service: Urology;  Laterality: N/A;   CYSTOSCOPY W/ URETERAL STENT PLACEMENT Right 08/17/2017   Procedure: CYSTOSCOPY WITH RETROGRADE PYELOGRAM/URETERAL STENT REMOVAL;  Surgeon: Alexis Frock, MD;  Location[facility];  Service: Urology;  Laterality: Right;   CYSTOSCOPY WITH RETROGRADE PYELOGRAM, URETEROSCOPY AND STENT PLACEMENT Right 03/20/2015   Procedure: CYSTOSCOPY WITH RETROGRADE PYELOGRAM, URETEROSCOPY WITH BALLOON DILATION AND STENT PLACEMENT ON RIGHT;  Surgeon: Alexis Frock, MD;  Location: [facility];  Service: Urology;  Laterality: Right;   CYSTOSCOPY WITH RETROGRADE PYELOGRAM, URETEROSCOPY AND  STENT PLACEMENT Right 05/02/2015   Procedure: CYSTOSCOPY WITH RIGHT RETROGRADE PYELOGRAM,  DIAGNOSTIC URETEROSCOPY AND STENT PULL ;  Surgeon: Alexis Frock, MD;  Location: [facility];  Service: Urology;  Laterality: Right;   CYSTOSCOPY WITH RETROGRADE PYELOGRAM, URETEROSCOPY AND STENT PLACEMENT Right 09/05/2015   Procedure: CYSTOSCOPY WITH RETROGRADE PYELOGRAM,  AND STENT PLACEMENT;  Surgeon: Alexis Frock, MD;  [facility];  Service: Urology;  Laterality: Right;   CYSTOSCOPY WITH RETROGRADE PYELOGRAM, URETEROSCOPY AND STENT PLACEMENT Right 04/01/2017   Procedure: CYSTOSCOPY WITH RETROGRADE PYELOGRAM, URETEROSCOPY AND STENT PLACEMENT;  Surgeon: Alexis Frock, MD;  [facility];  Service: Urology;  Laterality: Right;   CYSTOSCOPY WITH STENT PLACEMENT Right 10/27/2016   Procedure: CYSTOSCOPY WITH STENT CHANGE and right retrograde pyelogram;  Surgeon: Alexis Frock, MD;  Location: [facility];  Service: Urology;  Laterality: Right;   EUS N/A 10/02/2014   Procedure: LOWER ENDOSCOPIC ULTRASOUND (EUS);  Surgeon: Arta Silence,  MD;  [facility];  Service: Endoscopy;  Laterality: N/A;   EXCISION SOFT TISSUE MASS RIGHT FOREMAN  12-08-2006   EYE SURGERY  as child   pytosis of eyelids repair   INCISION AND DRAINAGE OF WOUND Bilateral 12/26/2015   Procedure: DEBRIDEMENT OF BILATERAL MASTECTOMY FLAPS;  Surgeon: Irene Limbo, MD;  Location: [place];  Service: Plastics;  Laterality: Bilateral;   IR CV LINE INJECTION  05/31/2017   IR FLUORO GUIDE CV LINE LEFT  05/31/2017   IR FLUORO GUIDE CV LINE RIGHT  04/06/2017   IR FLUORO GUIDE CV MIDLINE PICC RIGHT  05/30/2017   IR NEPHROSTOGRAM LEFT INITIAL PLACEMENT  09/02/2017   IR NEPHROSTOGRAM LEFT THRU EXISTING ACCESS  11/29/2017   IR NEPHROSTOGRAM RIGHT INITIAL PLACEMENT  09/02/2017   IR NEPHROSTOGRAM RIGHT THRU EXISTING ACCESS  09/13/2017   IR NEPHROSTOGRAM RIGHT THRU EXISTING ACCESS  11/29/2017   IR  NEPHROSTOMY EXCHANGE LEFT  11/28/2017   IR NEPHROSTOMY EXCHANGE LEFT  01/05/2018   IR NEPHROSTOMY EXCHANGE LEFT  02/16/2018   IR NEPHROSTOMY EXCHANGE LEFT  03/30/2018   IR NEPHROSTOMY EXCHANGE LEFT  05/12/2018   IR NEPHROSTOMY EXCHANGE LEFT  06/21/2018   IR NEPHROSTOMY EXCHANGE LEFT  08/04/2018   IR NEPHROSTOMY EXCHANGE LEFT  09/18/2018   IR NEPHROSTOMY EXCHANGE LEFT  10/09/2018   IR NEPHROSTOMY EXCHANGE LEFT  10/27/2018   IR NEPHROSTOMY EXCHANGE LEFT  11/21/2018   IR NEPHROSTOMY EXCHANGE LEFT  01/05/2019   IR NEPHROSTOMY EXCHANGE LEFT  02/15/2019   IR NEPHROSTOMY EXCHANGE LEFT  03/29/2019   IR NEPHROSTOMY EXCHANGE RIGHT  10/02/2017   IR NEPHROSTOMY EXCHANGE RIGHT  11/28/2017   IR NEPHROSTOMY EXCHANGE RIGHT  01/05/2018   IR NEPHROSTOMY EXCHANGE RIGHT  02/16/2018   IR NEPHROSTOMY EXCHANGE RIGHT  03/30/2018   IR NEPHROSTOMY EXCHANGE RIGHT  05/12/2018   IR NEPHROSTOMY EXCHANGE RIGHT  06/21/2018   IR NEPHROSTOMY EXCHANGE RIGHT  08/04/2018   IR NEPHROSTOMY EXCHANGE RIGHT  09/18/2018   IR NEPHROSTOMY EXCHANGE RIGHT  10/27/2018   IR NEPHROSTOMY EXCHANGE RIGHT  11/21/2018   IR NEPHROSTOMY EXCHANGE RIGHT  01/05/2019   IR NEPHROSTOMY EXCHANGE RIGHT  02/15/2019   IR NEPHROSTOMY EXCHANGE RIGHT  03/29/2019   IR NEPHROSTOMY PLACEMENT LEFT  10/02/2017   IR RADIOLOGIST EVAL & MGMT  05/03/2017   IR US GUIDE VASC ACCESS LEFT  05/31/2017   IR US GUIDE VASC ACCESS RIGHT  04/06/2017   IR US GUIDE VASC ACCESS RIGHT  05/30/2017   LAPAROSCOPIC CHOLECYSTECTOMY  1990   LIPOSUCTION WITH LIPOFILLING Bilateral 04/16/2016   Procedure: LIPOSUCTION WITH LIPOFILLING TO BILATERAL CHEST;  Surgeon: Irene Limbo, MD;  Location: [place];  Service: Plastics;  Laterality: Bilateral;   MASTECTOMY W/ SENTINEL NODE BIOPSY Bilateral 12/11/2015   Procedure: RIGHT PROPHYLACTIC MASTECTOMY, LEFT TOTAL MASTECTOMY WITH LEFT AXILLARY SENTINEL LYMPH NODE BIOPSY;  Surgeon: Stark Klein, MD;  Location: [place];  Service: General;  Laterality:  Bilateral;   OSTOMY N/A 11/19/2014   Procedure: OSTOMY;  Surgeon: Michael [name], MD;  [facility];  Service: General;  Laterality: N/A;   PROCTOSCOPY N/A 04/01/2017   Procedure: RIDGE PROCTOSCOPY;  Surgeon: Michael [name], MD;  [facility];  Service: General;  Laterality: N/A;   REMOVAL OF BILATERAL TISSUE EXPANDERS WITH PLACEMENT OF BILATERAL BREAST IMPLANTS Bilateral 04/16/2016   Procedure: REMOVAL OF BILATERAL TISSUE EXPANDERS WITH PLACEMENT OF BILATERAL BREAST IMPLANTS;  Surgeon: Irene Limbo, MD;  Location: [place];  Service: Plastics;  Laterality: Bilateral;   ROBOTIC ASSISTED LAP VAGINAL HYSTERECTOMY N/A 11/19/2014   Procedure: ROBOTIC LYSIS OF ADHESIONS, CONVERTED TO LAPAROTOMY RADICAL UPPER VAGINECTOMY,LOW ANTERIOR BOWEL RESECTION, COLOSTOMY, BILATERAL URETERAL STENT PLACEMENT AND CYSTONOMY CLOSURE;  Surgeon: Everitt Amber, MD;  [facility];  Service: Gynecology;  Laterality: N/A;   TISSUE EXPANDER FILLING Bilateral 12/26/2015   Procedure: EXPANSION OF BILATERAL CHEST TISSUE EXPANDERS (60 mL- Right; 75 mL- Left);  Surgeon: Irene Limbo, MD;  Location: [place];  Service: Plastics;  Laterality: Bilateral;   TONSILLECTOMY     TOTAL ABDOMINAL HYSTERECTOMY  March 2006   [facility]   and Bilateral Salpingoophorectomy/  staging for Ovarian cancer/  an   XI ROBOTIC ASSISTED LOWER ANTERIOR RESECTION N/A 04/01/2017   Procedure: XI ROBOTIC VS LAPAROSCOPIC COLOSTOMY TAKEDOWN WITH LYSIS OF ADHESIONS.;  Surgeon: Michael [name], MD;  [facility];  Service: General;  Laterality: N/A;  ERAS PATHWAY   Patient Active Problem List   Diagnosis Date Noted   DM (diabetes mellitus) type II, controlled, with peripheral vascular disorder ([place]) 01/20/2021   Essential (hemorrhagic) thrombocythemia ([place]) 01/20/2021   First degree heart block 01/18/2020   Paroxysmal atrial fibrillation ([place]) 01/18/2020   Swelling of right parotid gland 10/03/2019   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   Chronic pain    SBO (small bowel obstruction) ([place]) 01/17/2019   Depression 01/16/2019   Other chronic postprocedural pain 05/25/2018   Malignant neoplasm of uterus ([place]) 05/25/2018   Recurrent UTI 03/08/2018   Acute pyelonephritis 11/29/2017   HLD (hyperlipidemia) 11/29/2017   GERD (gastroesophageal reflux disease) 11/29/2017   Type II diabetes mellitus with renal manifestations ([place]) 11/29/2017   Encounter for palliative care    Preventative health care    Poor venous access    Acute renal failure superimposed on chronic kidney disease ([place]) 10/02/2017   Decubitus ulcer of sacral region, stage 2 ([place]) 61/44/3154   Metabolic acidosis 00/86/7619   MDD (major depressive disorder), single episode, moderate ([place])    Poorly controlled diabetes mellitus ([place]) 08/31/2017   Pressure injury of skin 08/31/2017   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 08/31/2017   History of external beam radiation therapy to pelvis 08/31/2017   ARF (acute renal failure) ([place]) 08/30/2017   Adjustment disorder with mixed anxiety and depressed mood 08/30/2017   Dehydration 08/29/2017   CKD (chronic kidney disease), stage III ([place]) 08/24/2017   Wound of right buttock 08/10/2017   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[place]) 06/20/2017   Postoperative anemia 04/04/2017   Pelvic cancer s/p colostomy takedown/loop ileostomy diversion 04/01/2017 04/01/2017   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 04/01/2017   Hot flashes related to aromatase inhibitor therapy 03/18/2017   Ureteral stricture, right, 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) 04/01/2017 09/10/2016   Vitamin D insufficiency 08/30/2016   Genetic testing 11/27/2015   Cancer of corpus uteri, except isthmus ([place]) 11/13/2015   Breast cancer of upper-inner quadrant of left female breast ([place])    Pelvic pain in female 03/19/2015   Chronic anemia 12/30/2014   UTI (urinary tract infection)    Pancytopenia, acquired ([place]) 11/26/2014    Class 2 severe obesity with serious comorbidity and body mass index (BMI) of 38.0 to 38.9 in adult [facility]) 11/20/2014   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 s/p resection/partial vaginectomy/ LAR/colostomy 11/19/2014 11/19/2014   Abdominal pain 09/09/2014   Postmenopausal bleeding 09/05/2014   History of ovarian & endometrial cancer 07/17/2012   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[place]) 05/10/2012   Chronic neutropenia ([place])  12/14/2011   Primary hypothyroidism 12/14/2011   DM type 2 (diabetes mellitus, type 2) ([place]) 12/14/2011   Benign essential HTN 12/14/2011    REFERRING DIAG: M54.50,G89.29 (ICD-10-CM) - Chronic bilateral low back pain without sciatica   THERAPY DIAG:  Chronic left-sided low back pain without sciatica  Muscle weakness (generalized)  PERTINENT HISTORY: H/o CA, ostomy bag, radiation to pelvis, can't sweat due to skin breakdown, reports known LLD, multiple abdomina surgeries  PRECAUTIONS: Other: h/o CA, ostomy bag, cannot sweat d/t skin breakdown  SUBJECTIVE: I was feeling really good so I stopped taking tylenol and muscle relaxer and was hurting pretty bad.  Heel lift made my knee a little uncomfortable. I remember my mom used to have to hem my Lt pant leg a little shorter than the Rt.   PAIN:  Are you having pain? Yes 5-6/10 Lt low back Knife jabbing around SIJ, pulling when raising leg Aggravating: Lt hip flexion    PATIENT GOALS decrease struggle to get up in the AM- pain on Lt buttock; lift Lt leg without it hurting, have to sit and stretch for several minutes in the AM before I can get up, improve balance  OBJECTIVE:   4 Stage Balance:  NBOS - independent and stable Semi-tandem- independent and mild instability with Lt foot back Tandem- requires assistance to obtain position and unstable when holding SLS-not tested   TODAY'S TREATMENT: 10/17 Reduced lift in Lt shoe to single layer Standing straight arm press down on physioball at chest  height- glut set prior to press Physioball on table- lateral rolling with UEs for core engagement Seated physioball roll out following manual Seated mini march with ball bw knees Manual: STM Lt lumbar paraspinals- seated with arms over pysioball  10/14  Nustep L2 5 min  TRA brace with pursed lip exhale due to holding breath 3s 10x PPT 2x10 2s with cuing for diaphrag breathing Supine clamshell RTB 2x10 3s with TrA contraction McGill curl up 2x10  Bridge with red band at knees 2x10 Standing march 20x  10/11  TRA brace with pursed lip exhale due to holding breath 3s 10x PPT 2x10 2s with cuing for diaphrag breathing Supine clamshell RTB 2x10 3s with TrA contraction [place] curl up 10x 2s hold Bridge 10x     PATIENT EDUCATION:  Education details: anatomy, exercise progression, DOMS expectations, muscle firing, envelope of function, HEP, POC   Person educated: Patient Education method: Explanation, Demonstration, Tactile cues, and Verbal cues, Handout Education comprehension: verbalized understanding, returned demonstration, verbal cues required, tactile cues required, and needs further education     HOME EXERCISE PROGRAM:    Access Code: [facility] URL: [email or web address]   Eval: "Something under feet, pillow for lumbar support, abdominal engagement seated, try heel lift (2 layer Rt shoe)"     ASSESSMENT:   CLINICAL IMPRESSION: Tested balance tolerance today. Pt had significant trigger point in Lt lumbar paraspinals limiting proper motion of Lt SIJ in hip flexion-reduced with manual therapy. Gentle exercises for core engagement well tolerated but pt has limited sensation in upper abdominal region. Encouraged her to purchase a physioball for HEP.      REHAB POTENTIAL: Fair     CLINICAL DECISION MAKING: Unstable/unpredictable   EVALUATION COMPLEXITY: Moderate     GOALS: Goals reviewed with patient? Yes   SHORT TERM GOALS:   STG Name Target Date  Goal status  1 Pt will verbalize ability to obtain a gentle core contraction in daily, resting postures Baseline: difficult at eval but was  able to recognize with cuing  05/29/21 INITIAL  2 Pt will begin working on a daily walking program with knowledge of limitations to avoid sweating and impacting skin Baseline: will discuss and progress as appropriate 05/29/21 INITIAL  3 Pt will be independent in short term HEP as it has been established Baseline:will progress as appropriate 05/29/21 INITIAL  4 Pt will be ready to transition to aquatic exercises Baseline: multiple considerations to take into account 05/29/21 INITIAL                               LONG TERM GOALS:  to be set at short term re-evaluation based on response to exercises   LTG Name Target Date Goal status  1        2        3        4        5        6        7           PLAN: PT FREQUENCY: 2x/week   PT DURATION: 8 weeks   PLANNED INTERVENTIONS: Therapeutic exercises, Therapeutic activity, Neuro Muscular re-education, Balance training, Gait training, Patient/Family education, Joint mobilization, Stair training, Aquatic Therapy, Cryotherapy, Moist heat, Taping, and Manual therapy   PLAN FOR NEXT SESSION: cont core engagement in seated and standing, physioball  Jazara Swiney C. Jacques Fife PT, DPT 05/18/21 10:51 AM

## 2021-05-20 ENCOUNTER — Ambulatory Visit (HOSPITAL_BASED_OUTPATIENT_CLINIC_OR_DEPARTMENT_OTHER): Payer: BC Managed Care – PPO | Admitting: Physical Therapy

## 2021-05-20 ENCOUNTER — Encounter (HOSPITAL_BASED_OUTPATIENT_CLINIC_OR_DEPARTMENT_OTHER): Payer: Self-pay | Admitting: Physical Therapy

## 2021-05-20 ENCOUNTER — Other Ambulatory Visit: Payer: Self-pay

## 2021-05-20 DIAGNOSIS — G8929 Other chronic pain: Secondary | ICD-10-CM | POA: Diagnosis not present

## 2021-05-20 DIAGNOSIS — M6281 Muscle weakness (generalized): Secondary | ICD-10-CM | POA: Diagnosis not present

## 2021-05-20 DIAGNOSIS — M545 Low back pain, unspecified: Secondary | ICD-10-CM | POA: Diagnosis not present

## 2021-05-20 NOTE — Therapy (Signed)
OUTPATIENT PHYSICAL THERAPY TREATMENT NOTE   Patient Name: [name] MRN: [id number] DOB:10/11/1955, 65 y.o., female Today's Date: 05/20/2021  PCP: Ann Held, DO REFERRING PROVIDER: Ann Held, *   PT End of Session - 05/20/21 1102     Visit Number 5    Number of Visits 17    Date for PT Re-Evaluation 07/03/21    Authorization Type MCR    Authorization - Visit Number 25    Progress Note Due on Visit 10    PT Start Time 1100    PT Stop Time 1140    PT Time Calculation (min) 40 min    Activity Tolerance Patient tolerated treatment well    Behavior During Therapy [facility] for tasks assessed/performed              Past Medical History:  Diagnosis Date   Adrenal adenoma, left 02/08/2016   CT: stable benign   Anemia in neoplastic disease    Back pain    Benign essential HTN    Breast cancer, left ([place]) dx 10-30-2015  oncologist-  dr Ernst Spell gorsuch   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+, HER2 negative/  12-11-2015 bilateral mastecotmy w/ reconstruction (no radiation and no chemo)   Cancer of corpus uteri, except isthmus [facility])  oncologist-- dr Denman [name] and dr Alvy Bimler    10-15-2004  dx endometroid endometrial and ovarian cancer s/p  chemotheapy and surgery(TAH w/ BSO) :  recurrent 11-19-2014 post pelvic surgery and radiation 01-29-2015 to 03-10-2015   Chronic idiopathic neutropenia ([place])    presumed related to chemotherapy March 2006--- followed by dr Alvy Bimler (treatment w/ G-CSF injections   Chronic nausea    Chronic pain    perineal/ anal  area from bladder pad irritates skin , right flank pain   CKD stage G2/A3, GFR 60-89 and albumin creatinine ratio >300 mg/g    nephrologist-  dr Madelon Lips   Colovesical fistula    Diabetic retinopathy, background ([place])    Difficult intravenous access    small veins--- hx PICC lines   DM type 2 (diabetes mellitus, type 2) ([place])    monitored by dr Legrand Como altheimer   Dysuria     Environmental and seasonal allergies    Fatty liver 02/08/2016   CT   Generalized muscle weakness    GERD (gastroesophageal reflux disease)    Hiatal hernia    History of abdominal abscess 04/16/2017   post surgery 04-01-2017  --- resolved 10/ 2018   History of gastric polyp    2014  duodenum   History of ileus 04/16/2017   resolved w/ no surgical intervention   History of radiation therapy    01-29-2015 to 03-10-2015  pelvis 50.4Gy   Hypothyroidism    monitored by dr Legrand Como altheimer   IBS (irritable bowel syndrome)    Ileostomy in place ([place]) 04/01/2017   created at same time colostomy takedown.   Joint pain    Leg edema    Lower urinary tract symptoms (LUTS)    urge urinary  incontinence   Mixed dyslipidemia    Multiple thyroid nodules    Managed by Dr. Harlow Asa   Nephrostomy status [facility])    Palpitations    Pelvic abscess in female 04/16/2017   PONV (postoperative nausea and vomiting)    "scopolamine patch works for me"   Radiation-induced dermatitis    contact dermatitis , radiation completed, rash only on ankles now.   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   Vitamin D deficiency    Wears glasses    Past Surgical History:  Procedure Laterality Date   APPENDECTOMY     biopsy thyroid nodules     BREAST RECONSTRUCTION WITH PLACEMENT OF TISSUE EXPANDER AND FLEX HD (ACELLULAR HYDRATED DERMIS) Bilateral 12/11/2015   Procedure: BILATERAL BREAST RECONSTRUCTION WITH PLACEMENT OF TISSUE EXPANDERS;  Surgeon: Irene Limbo, MD;  Location: [place];  Service: Plastics;  Laterality: Bilateral;   COLONOSCOPY WITH PROPOFOL N/A 08/21/2013   Procedure: COLONOSCOPY WITH PROPOFOL;  Surgeon: Cleotis Nipper, MD;  [facility];  Service: Endoscopy;  Laterality: N/A;   COLOSTOMY TAKEDOWN N/A 12/04/2014   Procedure:  LAPROSCOPIC LYSIS OF ADHESIONS, SPLENIC MOBILIZATION, RELOCATION OF COLOSTOMY, DEBRIDEMENT INITIAL COLOSTOMY SITE;  Surgeon: Michael [name], MD;  [facility];  Service: General;  Laterality: N/A;   CYSTOGRAM N/A 06/01/2017   Procedure: CYSTOGRAM;  Surgeon: Alexis Frock, MD;  [facility];  Service: Urology;  Laterality: N/A;   CYSTOSCOPY W/ RETROGRADES Right 11/21/2015   Procedure: CYSTOSCOPY WITH RETROGRADE PYELOGRAM;  Surgeon: Alexis Frock, MD;  [facility];  Service: Urology;  Laterality: Right;   CYSTOSCOPY W/ URETERAL STENT PLACEMENT Right 11/21/2015   Procedure: CYSTOSCOPY WITH STENT REPLACEMENT;  Surgeon: Alexis Frock, MD;  [facility];  Service: Urology;  Laterality: Right;   CYSTOSCOPY W/ URETERAL STENT PLACEMENT Right 03/10/2016   Procedure: CYSTOSCOPY WITH STENT REPLACEMENT;  Surgeon: Alexis Frock, MD;  Location: [facility];  Service: Urology;  Laterality: Right;   CYSTOSCOPY W/ URETERAL STENT PLACEMENT Right 06/30/2016   Procedure: CYSTOSCOPY WITH RETROGRADE PYELOGRAM/URETERAL STENT EXCHANGE;  Surgeon: Alexis Frock, MD;  Location: [facility];  Service: Urology;  Laterality: Right;   CYSTOSCOPY W/ URETERAL STENT PLACEMENT N/A 06/01/2017   Procedure: CYSTOSCOPY WITH EXAM UNDER ANESTHESIA;  Surgeon: Alexis Frock, MD;  [facility];  Service: Urology;  Laterality: N/A;   CYSTOSCOPY W/ URETERAL STENT PLACEMENT Right 08/17/2017   Procedure: CYSTOSCOPY WITH RETROGRADE PYELOGRAM/URETERAL STENT REMOVAL;  Surgeon: Alexis Frock, MD;  Location[facility];  Service: Urology;  Laterality: Right;   CYSTOSCOPY WITH RETROGRADE PYELOGRAM, URETEROSCOPY AND STENT PLACEMENT Right 03/20/2015   Procedure: CYSTOSCOPY WITH RETROGRADE PYELOGRAM, URETEROSCOPY WITH BALLOON DILATION AND STENT PLACEMENT ON RIGHT;  Surgeon: Alexis Frock, MD;  Location: [facility];  Service: Urology;  Laterality: Right;   CYSTOSCOPY WITH  RETROGRADE PYELOGRAM, URETEROSCOPY AND STENT PLACEMENT Right 05/02/2015   Procedure: CYSTOSCOPY WITH RIGHT RETROGRADE PYELOGRAM,  DIAGNOSTIC URETEROSCOPY AND STENT PULL ;  Surgeon: Alexis Frock, MD;  Location: [facility];  Service: Urology;  Laterality: Right;   CYSTOSCOPY WITH RETROGRADE PYELOGRAM, URETEROSCOPY AND STENT PLACEMENT Right 09/05/2015   Procedure: CYSTOSCOPY WITH RETROGRADE PYELOGRAM,  AND STENT PLACEMENT;  Surgeon: Alexis Frock, MD;  [facility];  Service: Urology;  Laterality: Right;   CYSTOSCOPY WITH RETROGRADE PYELOGRAM, URETEROSCOPY AND STENT PLACEMENT Right 04/01/2017   Procedure: CYSTOSCOPY WITH RETROGRADE PYELOGRAM, URETEROSCOPY AND STENT PLACEMENT;  Surgeon: Alexis Frock, MD;  [facility];  Service: Urology;  Laterality: Right;   CYSTOSCOPY WITH STENT PLACEMENT Right 10/27/2016   Procedure: CYSTOSCOPY WITH STENT CHANGE and right retrograde pyelogram;  Surgeon: Alexis Frock, MD;  Location: [facility];  Service: Urology;  Laterality: Right;   EUS N/A 10/02/2014   Procedure:  LOWER ENDOSCOPIC ULTRASOUND (EUS);  Surgeon: Arta Silence, MD;  Location: [facility];  Service: Endoscopy;  Laterality: N/A;   EXCISION SOFT TISSUE MASS RIGHT FOREMAN  12-08-2006   EYE SURGERY  as child   pytosis of eyelids repair   INCISION AND DRAINAGE OF WOUND Bilateral 12/26/2015   Procedure: DEBRIDEMENT OF BILATERAL MASTECTOMY FLAPS;  Surgeon: Irene Limbo, MD;  Location: [place];  Service: Plastics;  Laterality: Bilateral;   IR CV LINE INJECTION  05/31/2017   IR FLUORO GUIDE CV LINE LEFT  05/31/2017   IR FLUORO GUIDE CV LINE RIGHT  04/06/2017   IR FLUORO GUIDE CV MIDLINE PICC RIGHT  05/30/2017   IR NEPHROSTOGRAM LEFT INITIAL PLACEMENT  09/02/2017   IR NEPHROSTOGRAM LEFT THRU EXISTING ACCESS  11/29/2017   IR NEPHROSTOGRAM RIGHT INITIAL PLACEMENT  09/02/2017   IR NEPHROSTOGRAM RIGHT THRU EXISTING ACCESS  09/13/2017   IR NEPHROSTOGRAM RIGHT  THRU EXISTING ACCESS  11/29/2017   IR NEPHROSTOMY EXCHANGE LEFT  11/28/2017   IR NEPHROSTOMY EXCHANGE LEFT  01/05/2018   IR NEPHROSTOMY EXCHANGE LEFT  02/16/2018   IR NEPHROSTOMY EXCHANGE LEFT  03/30/2018   IR NEPHROSTOMY EXCHANGE LEFT  05/12/2018   IR NEPHROSTOMY EXCHANGE LEFT  06/21/2018   IR NEPHROSTOMY EXCHANGE LEFT  08/04/2018   IR NEPHROSTOMY EXCHANGE LEFT  09/18/2018   IR NEPHROSTOMY EXCHANGE LEFT  10/09/2018   IR NEPHROSTOMY EXCHANGE LEFT  10/27/2018   IR NEPHROSTOMY EXCHANGE LEFT  11/21/2018   IR NEPHROSTOMY EXCHANGE LEFT  01/05/2019   IR NEPHROSTOMY EXCHANGE LEFT  02/15/2019   IR NEPHROSTOMY EXCHANGE LEFT  03/29/2019   IR NEPHROSTOMY EXCHANGE RIGHT  10/02/2017   IR NEPHROSTOMY EXCHANGE RIGHT  11/28/2017   IR NEPHROSTOMY EXCHANGE RIGHT  01/05/2018   IR NEPHROSTOMY EXCHANGE RIGHT  02/16/2018   IR NEPHROSTOMY EXCHANGE RIGHT  03/30/2018   IR NEPHROSTOMY EXCHANGE RIGHT  05/12/2018   IR NEPHROSTOMY EXCHANGE RIGHT  06/21/2018   IR NEPHROSTOMY EXCHANGE RIGHT  08/04/2018   IR NEPHROSTOMY EXCHANGE RIGHT  09/18/2018   IR NEPHROSTOMY EXCHANGE RIGHT  10/27/2018   IR NEPHROSTOMY EXCHANGE RIGHT  11/21/2018   IR NEPHROSTOMY EXCHANGE RIGHT  01/05/2019   IR NEPHROSTOMY EXCHANGE RIGHT  02/15/2019   IR NEPHROSTOMY EXCHANGE RIGHT  03/29/2019   IR NEPHROSTOMY PLACEMENT LEFT  10/02/2017   IR RADIOLOGIST EVAL & MGMT  05/03/2017   IR US GUIDE VASC ACCESS LEFT  05/31/2017   IR US GUIDE VASC ACCESS RIGHT  04/06/2017   IR US GUIDE VASC ACCESS RIGHT  05/30/2017   LAPAROSCOPIC CHOLECYSTECTOMY  1990   LIPOSUCTION WITH LIPOFILLING Bilateral 04/16/2016   Procedure: LIPOSUCTION WITH LIPOFILLING TO BILATERAL CHEST;  Surgeon: Irene Limbo, MD;  Location: [place];  Service: Plastics;  Laterality: Bilateral;   MASTECTOMY W/ SENTINEL NODE BIOPSY Bilateral 12/11/2015   Procedure: RIGHT PROPHYLACTIC MASTECTOMY, LEFT TOTAL MASTECTOMY WITH LEFT AXILLARY SENTINEL LYMPH NODE BIOPSY;  Surgeon: Stark Klein, MD;  Location: [employer];   Service: General;  Laterality: Bilateral;   OSTOMY N/A 11/19/2014   Procedure: OSTOMY;  Surgeon: Michael [name], MD;  [facility];  Service: General;  Laterality: N/A;   PROCTOSCOPY N/A 04/01/2017   Procedure: RIDGE PROCTOSCOPY;  Surgeon: Michael [name], MD;  [facility];  Service: General;  Laterality: N/A;   REMOVAL OF BILATERAL TISSUE EXPANDERS WITH PLACEMENT OF BILATERAL BREAST IMPLANTS Bilateral 04/16/2016   Procedure: REMOVAL OF BILATERAL TISSUE EXPANDERS WITH PLACEMENT OF BILATERAL BREAST IMPLANTS;  Surgeon: Irene Limbo, MD;  Location:  [place];  Service: Plastics;  Laterality: Bilateral;   ROBOTIC ASSISTED LAP VAGINAL HYSTERECTOMY N/A 11/19/2014   Procedure: ROBOTIC LYSIS OF ADHESIONS, CONVERTED TO LAPAROTOMY RADICAL UPPER VAGINECTOMY,LOW ANTERIOR BOWEL RESECTION, COLOSTOMY, BILATERAL URETERAL STENT PLACEMENT AND CYSTONOMY CLOSURE;  Surgeon: Everitt Amber, MD;  [facility];  Service: Gynecology;  Laterality: N/A;   TISSUE EXPANDER FILLING Bilateral 12/26/2015   Procedure: EXPANSION OF BILATERAL CHEST TISSUE EXPANDERS (60 mL- Right; 75 mL- Left);  Surgeon: Irene Limbo, MD;  Location: [place];  Service: Plastics;  Laterality: Bilateral;   TONSILLECTOMY     TOTAL ABDOMINAL HYSTERECTOMY  March 2006   [facility]   and Bilateral Salpingoophorectomy/  staging for Ovarian cancer/  an   XI ROBOTIC ASSISTED LOWER ANTERIOR RESECTION N/A 04/01/2017   Procedure: XI ROBOTIC VS LAPAROSCOPIC COLOSTOMY TAKEDOWN WITH LYSIS OF ADHESIONS.;  Surgeon: Michael [name], MD;  [facility];  Service: General;  Laterality: N/A;  ERAS PATHWAY   Patient Active Problem List   Diagnosis Date Noted   DM (diabetes mellitus) type II, controlled, with peripheral vascular disorder ([place]) 01/20/2021   Essential (hemorrhagic) thrombocythemia ([place]) 01/20/2021   First degree heart block 01/18/2020   Paroxysmal atrial fibrillation ([place]) 01/18/2020   Swelling of right parotid gland  10/03/2019   Vesicorectal fistula 02/26/2019   Chronic pain    SBO (small bowel obstruction) ([place]) 01/17/2019   Depression 01/16/2019   Other chronic postprocedural pain 05/25/2018   Malignant neoplasm of uterus ([place]) 05/25/2018   Recurrent UTI 03/08/2018   Acute pyelonephritis 11/29/2017   HLD (hyperlipidemia) 11/29/2017   GERD (gastroesophageal reflux disease) 11/29/2017   Type II diabetes mellitus with renal manifestations ([place]) 11/29/2017   Encounter for palliative care    Preventative health care    Poor venous access    Acute renal failure superimposed on chronic kidney disease ([place]) 10/02/2017   Decubitus ulcer of sacral region, stage 2 ([place]) 16/05/9603   Metabolic acidosis 54/04/8118   MDD (major depressive disorder), single episode, moderate ([place])    Poorly controlled diabetes mellitus ([place]) 08/31/2017   Pressure injury of skin 08/31/2017   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 08/31/2017   History of external beam radiation therapy to pelvis 08/31/2017   ARF (acute renal failure) ([place]) 08/30/2017   Adjustment disorder with mixed anxiety and depressed mood 08/30/2017   Dehydration 08/29/2017   CKD (chronic kidney disease), stage III ([place]) 08/24/2017   Wound of right buttock 08/10/2017   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[place]) 06/20/2017   Postoperative anemia 04/04/2017   Pelvic cancer s/p colostomy takedown/loop ileostomy diversion 04/01/2017 04/01/2017   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 04/01/2017   Hot flashes related to aromatase inhibitor therapy 03/18/2017   Ureteral stricture, right, 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) 04/01/2017 09/10/2016   Vitamin D insufficiency 08/30/2016   Genetic testing 11/27/2015   Cancer of corpus uteri, except isthmus ([place]) 11/13/2015   Breast cancer of upper-inner quadrant of left female breast ([place])    Pelvic pain in female 03/19/2015   Chronic anemia 12/30/2014   UTI (urinary tract infection)    Pancytopenia,  acquired ([place]) 11/26/2014   Class 2 severe obesity with serious comorbidity and body mass index (BMI) of 38.0 to 38.9 in adult [facility]) 11/20/2014   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 s/p resection/partial vaginectomy/ LAR/colostomy 11/19/2014 11/19/2014   Abdominal pain 09/09/2014   Postmenopausal bleeding 09/05/2014   History of ovarian & endometrial cancer 07/17/2012   Primary malignant neoplasm of  ovary ([place]) 05/10/2012   Chronic neutropenia ([place]) 12/14/2011   Primary hypothyroidism 12/14/2011   DM type 2 (diabetes mellitus, type 2) ([place]) 12/14/2011   Benign essential HTN 12/14/2011    REFERRING DIAG: M54.50,G89.29 (ICD-10-CM) - Chronic bilateral low back pain without sciatica   THERAPY DIAG:  Chronic left-sided low back pain without sciatica  Muscle weakness (generalized)  PERTINENT HISTORY: H/o CA, ostomy bag, radiation to pelvis, can't sweat due to skin breakdown, reports known LLD, multiple abdomina surgeries  PRECAUTIONS: Other: h/o CA, ostomy bag, cannot sweat d/t skin breakdown  SUBJECTIVE: I was really sore after last time in the right lower back, it was hard to walk. Heel lift is okay no knee pain.   PAIN:  Are you having pain? Yes 4/10 Lt low back Knife jabbing around SIJ, pulling when raising leg Aggravating: Lt hip flexion    PATIENT GOALS decrease struggle to get up in the AM- pain on Lt buttock; lift Lt leg without it hurting, have to sit and stretch for several minutes in the AM before I can get up, improve balance  OBJECTIVE:   4 Stage Balance:  NBOS - independent and stable Semi-tandem- independent and mild instability with Lt foot back Tandem- requires assistance to obtain position and unstable when holding SLS-not tested   TODAY'S TREATMENT: 10/19 Hot pack on lower back- seated in chair for exercises, 2" block under feet Straight arm press down on physioball Seated march- feels pulling in Lt buttock when lifting either leg-  stronger when lifting Lt Adduction with ball, abduction with red tband Seated GHJ ER red tband with ball bw knees  10/17 Reduced lift in Lt shoe to single layer Standing straight arm press down on physioball at chest height- glut set prior to press Physioball on table- lateral rolling with UEs for core engagement Seated physioball roll out following manual Seated mini march with ball bw knees Manual: STM Lt lumbar paraspinals- seated with arms over pysioball  10/14  Nustep L2 5 min  TRA brace with pursed lip exhale due to holding breath 3s 10x PPT 2x10 2s with cuing for diaphrag breathing Supine clamshell RTB 2x10 3s with TrA contraction McGill curl up 2x10  Bridge with red band at knees 2x10 Standing march 20x  10/11  TRA brace with pursed lip exhale due to holding breath 3s 10x PPT 2x10 2s with cuing for diaphrag breathing Supine clamshell RTB 2x10 3s with TrA contraction McGill curl up 10x 2s hold Bridge 10x     PATIENT EDUCATION:  Education details: anatomy of condition   Person educated: Patient Education method: Consulting civil engineer, Demonstration, Corporate treasurer cues, and Verbal cues, Handout Education comprehension: verbalized understanding, returned demonstration, verbal cues required, tactile cues required, and needs further education     HOME EXERCISE PROGRAM:    Access Code: [facility] URL: [email or web address]   Eval: "Something under feet, pillow for lumbar support, abdominal engagement seated, try heel lift (2 layer Rt shoe)"     ASSESSMENT:   CLINICAL IMPRESSION: Exercises while seated with heat to low back felt good. Very limited abdominal engagement to stabilize for distal motion which is resulting in discomfort. Chair to provide some stability in conjuction with awareness of core engagement.      REHAB POTENTIAL: Fair     CLINICAL DECISION MAKING: Unstable/unpredictable   EVALUATION COMPLEXITY: Moderate     GOALS: Goals reviewed with  patient? Yes   SHORT TERM GOALS:   STG Name Target Date Goal status  1 Pt will verbalize  ability to obtain a gentle core contraction in daily, resting postures Baseline: difficult at eval but was able to recognize with cuing  05/29/21 INITIAL  2 Pt will begin working on a daily walking program with knowledge of limitations to avoid sweating and impacting skin Baseline: will discuss and progress as appropriate 05/29/21 INITIAL  3 Pt will be independent in short term HEP as it has been established Baseline:will progress as appropriate 05/29/21 INITIAL  4 Pt will be ready to transition to aquatic exercises Baseline: multiple considerations to take into account 05/29/21 INITIAL                               LONG TERM GOALS:  to be set at short term re-evaluation based on response to exercises   LTG Name Target Date Goal status  _0 PLAN: PT FREQUENCY: 2x/week   PT DURATION: 8 weeks   PLANNED INTERVENTIONS: Therapeutic exercises, Therapeutic activity, Neuro Muscular re-education, Balance training, Gait training, Patient/Family education, Joint mobilization, Stair training, Aquatic Therapy, Cryotherapy, Moist heat, Taping, and Manual therapy   PLAN FOR NEXT SESSION: cont core engagement in seated and standing, physioball  Saige Busby C. Layten Aiken PT, DPT 05/20/21 8:48 PM

## 2021-05-25 ENCOUNTER — Other Ambulatory Visit: Payer: Self-pay | Admitting: Medical

## 2021-05-25 ENCOUNTER — Other Ambulatory Visit: Payer: Self-pay

## 2021-05-25 ENCOUNTER — Telehealth: Payer: Self-pay | Admitting: Medical

## 2021-05-25 ENCOUNTER — Encounter (HOSPITAL_BASED_OUTPATIENT_CLINIC_OR_DEPARTMENT_OTHER): Payer: Self-pay

## 2021-05-25 ENCOUNTER — Telehealth (INDEPENDENT_AMBULATORY_CARE_PROVIDER_SITE_OTHER): Payer: BC Managed Care – PPO | Admitting: Medical

## 2021-05-25 ENCOUNTER — Encounter (HOSPITAL_BASED_OUTPATIENT_CLINIC_OR_DEPARTMENT_OTHER): Payer: BC Managed Care – PPO | Admitting: Physical Therapy

## 2021-05-25 VITALS — BP 129/70 | HR 68 | Temp 99.5°F

## 2021-05-25 DIAGNOSIS — R0989 Other specified symptoms and signs involving the circulatory and respiratory systems: Secondary | ICD-10-CM | POA: Diagnosis not present

## 2021-05-25 DIAGNOSIS — U071 COVID-19: Secondary | ICD-10-CM | POA: Diagnosis not present

## 2021-05-25 DIAGNOSIS — R058 Other specified cough: Secondary | ICD-10-CM | POA: Diagnosis not present

## 2021-05-25 DIAGNOSIS — E1151 Type 2 diabetes mellitus with diabetic peripheral angiopathy without gangrene: Secondary | ICD-10-CM

## 2021-05-25 MED ORDER — FLUTICASONE PROPIONATE 50 MCG/ACT NA SUSP
2.0000 | Freq: Every day | NASAL | 1 refills | Status: DC
Start: 2021-05-25 — End: 2021-05-25

## 2021-05-25 MED ORDER — MOLNUPIRAVIR EUA 200MG CAPSULE: 4.0000 | ORAL_CAPSULE | Freq: Two times a day (BID) | ORAL | 0 refills | Status: AC

## 2021-05-25 MED ORDER — BENZONATATE 100 MG PO CAPS: 100.0000 mg | ORAL_CAPSULE | Freq: Three times a day (TID) | ORAL | 0 refills | Status: DC | PRN

## 2021-05-25 NOTE — Patient Instructions (Addendum)
Pt with covid infection. Mild to moderate signs and symptoms in formerly vaccinated pt. Day 2-3 of symptom onset.   Risk score complication of 5. GFR last on file 32. No recent update. Also crestor on med list.  So decided best to start molnupiravir. Rx advisement given.  Check 02 sat daily and give update if o2 sats less than 96%. Today on exam with her she checked and o2 sat was 96-97% sustained. If 02 sats decrease less than 96% let us know.  Flonase for nasal congestion and benzonatate for cough.   If worsening productive cough or chest congestion then would consider starting antibiotic and get out pt chest xray.  Will send message to pt pcp in to review in light of chronic neutropenia.   Follow up in 2 days my chart update on how you are doing. Can see in office if needed as well. Typical 5-6days post positive test  in office reasonable  if lingering signs or symptoms.

## 2021-05-25 NOTE — Progress Notes (Signed)
Subjective:    Patient ID: [name], female    DOB: 1955-09-10, 65 y.o.   MRN: [id number]  HPI  Virtual Visit via Video Note  I connected with [name] on 05/25/21 at  1:40 PM EDT by a video enabled telemedicine application and verified that I am speaking with the correct person using two identifiers.  Location: Patient: home Provider: office   I discussed the limitations of evaluation and management by telemedicine and the availability of in person appointments. The patient expressed understanding and agreed to proceed.  History of Present Illness:   Pt recently diagnosed with covid on Saturday. Symptom onset same day.  Test used-rapid on Saturday.   History of covid vaccine x 4 vaccines.  Covid risk score- 5  History of covid infection- pt never had covid before.   Current symptoms- pt has has ha, st, cough, decreased appetitie, nasal congestion, low grade fever and sneezing. Cough is mild productive.  Pt does see hematolgist. Pt had chemotherapy.   Gfr one month ago is 39.   Pt just had granix on Saturday. Hx of neutropenia due to previous chemotherapy. Hx of breast ca, ovarian and endometrial CA.  "Chronic neutropenia (HCC) Her CBC today is stable although she is mildly neutropenic This has been chronic since her previous chemotherapy many years ago I recommend frequent injection every 5-6 days.  We will continue to obtain insurance authorization for G-CSF support indefinitely due to history of recurrent life-threatening infection"  Pt 02 sat- 97% .Bp and pulse not checked.   Observations/Objective:  General-no acute distress, pleasant, oriented. Lungs- on inspection lungs appear unlabored. Neck- no tracheal deviation or jvd on inspection. Neuro- gross motor function appears intact.    Assessment and Plan:  Patient Instructions  Pt with covid infection. Mild to moderate signs and symptoms in formerly vaccinated pt. Day 2-3 of symptom  onset.   Risk score complication of 5. GFR last on file 32. No recent update. Also crestor on med list.  So decided best to start molnupiravir. Rx advisement given.  Check 02 sat daily and give update if o2 sats less than 96%. Today on exam with her she checked and o2 sat was 96-97% sustained. If 02 sats decrease less than 96% let us know.  Flonase for nasal congestion and benzonatate for cough.   If worsening productive cough or chest congestion then would consider starting antibiotic and get out pt chest xray.  Will send message to pt pcp in to review in light of chronic neutropenia.   Follow up in 2 days my chart update on how you are doing. Can see in office if needed as well. Typical 5-6days post positive test  in office reasonable  if lingering signs or symptoms.   Time spent with patient today was 26 minutes which consisted of chart revdiew, discussing diagnosis, work up treatment and documentation.  Follow Up Instructions:    I discussed the assessment and treatment plan with the patient. The patient was provided an opportunity to ask questions and all were answered. The patient agreed with the plan and demonstrated an understanding of the instructions.   The patient was advised to call back or seek an in-person evaluation if the symptoms worsen or if the condition fails to improve as anticipated.     Mackie Pai, PA-C   Review of Systems     Objective:   Physical Exam        Assessment & Plan:

## 2021-05-25 NOTE — Telephone Encounter (Signed)
Dr. Etter Sjogren and Dr. Charlett Blake.  Wanted you to review note on this pt. Covid + pt. Risk score 5. Started on monupiravir. Early in treatment. With her chronic neutropenia history/oncology history would you consider remdesivir or monoclonal. Her 02 sat is good. Mild moderate symptoms. She is new to me so wanted your input as she sees you.  Since she appears stable plan was to ask her to give me update Wednesday morning then consider further treatment?  Dr. Si Gaul. Sending you copy as when was about to send note to Dr. Etter Sjogren looks like she might be in town next few days?    Thanks Percell Miller

## 2021-05-26 ENCOUNTER — Ambulatory Visit: Payer: BC Managed Care – PPO | Admitting: Family Medicine

## 2021-05-27 ENCOUNTER — Telehealth: Payer: Self-pay | Admitting: Family Medicine

## 2021-05-27 NOTE — Telephone Encounter (Signed)
Pt. Called and stated she was informed to call and updated Percell Miller on how she's doing:  Pt stated she is starting to feel better some. She is still taking her medication. She did state that her cough is not getting better and the medication given is not working for the cough.

## 2021-05-27 NOTE — Addendum Note (Signed)
Addended by: Anabel Halon on: 05/27/2021 08:17 PM   Modules accepted: Orders

## 2021-05-28 MED ORDER — HYDROCODONE BIT-HOMATROP MBR 5-1.5 MG/5ML PO SOLN: 5.0000 mL | Freq: Four times a day (QID) | ORAL | 0 refills | Status: DC | PRN

## 2021-05-28 NOTE — Telephone Encounter (Signed)
Pt.notified

## 2021-05-28 NOTE — Telephone Encounter (Signed)
Pt states the cough syrup helped in the past and no side effects , and she denied cxr because she doesn't feel its down in her chest and its in the upper part , and coughs mostly at night but does cough often.

## 2021-05-28 NOTE — Addendum Note (Signed)
Addended by: Anabel Halon on: 05/28/2021 09:06 AM   Modules accepted: Orders

## 2021-06-01 ENCOUNTER — Ambulatory Visit: Payer: BC Managed Care – PPO

## 2021-06-02 ENCOUNTER — Other Ambulatory Visit: Payer: Self-pay

## 2021-06-02 ENCOUNTER — Ambulatory Visit (INDEPENDENT_AMBULATORY_CARE_PROVIDER_SITE_OTHER): Payer: BC Managed Care – PPO | Admitting: Family Medicine

## 2021-06-02 VITALS — BP 110/72 | HR 70 | Ht 62.0 in | Wt 214.2 lb

## 2021-06-02 DIAGNOSIS — M545 Low back pain, unspecified: Secondary | ICD-10-CM

## 2021-06-02 DIAGNOSIS — G8929 Other chronic pain: Secondary | ICD-10-CM

## 2021-06-02 MED ORDER — TIZANIDINE HCL 4 MG PO TABS: 2.0000 mg | ORAL_TABLET | Freq: Three times a day (TID) | ORAL | 1 refills | Status: DC | PRN

## 2021-06-02 NOTE — Patient Instructions (Addendum)
Thank you for coming in today.   Recheck as needed.   I can re-order PT as needed.   Work on those home exercises.   Half-assed exercises is better than nothing.   Try to do at least something daily.

## 2021-06-02 NOTE — Progress Notes (Signed)
I, Peterson Lombard, LAT, ATC acting as a scribe for [name], MD.  [name] is a 65 y.o. female who presents to [place] [facility] today for f/u chronic LBP likely due to core weakness from her extensive abdominal surgical history and significant degenerative changes. Pt was last seen by Dr. Georgina Snell on 04/23/21 and was referred to PT, completing 5 visits. Today, pt reports LBP is somewhat better, 50-75% improvement. Pt notes having one rough PT session where they worked on a "knot" in her low back that caused an increase in pain. Pt has been recovering from [place] 2 weeks ago and has been resting.  Dx imaging: 04/23/21 Sacrum/Coccyx & L-spine XR  Pertinent review of systems: No fevers or chills  Relevant historical information: History of ovarian cancer with local invasion to pelvic organs requiring extensive surgery.  Last surgery was in 2020.  Patient has also had pelvic radiation. She had COVID about 2 weeks ago.   Exam:  BP 110/72   Pulse 70   Ht 5\' 2"  (1.575 m)   Wt 214 lb 3.2 oz (97.2 kg)   SpO2 99%   BMI 39.18 kg/m  General: Well Developed, well nourished, and in no acute distress.   MSK: L-spine normal-appearing nontender midline.  Generally weak core musculature with somewhat poor trunk control.    Lab and Radiology Results    EXAM: LUMBAR SPINE - 2-3 VIEW   COMPARISON:  Sacral/coccygeal radiographs-earlier same day   FINDINGS: There are 5 non rib-bearing lumbar type vertebral bodies   Grade 1 anterolisthesis of L3 upon L4 measuring 4 mm. No retrolisthesis. No significant scoliotic curvature.   Lumbar vertebral body heights appear preserved.   Mild to moderate multilevel lumbar spine DDD, worse at L5-S1 and to a lesser extent, L3-L4 with disc space height loss, endplate irregularity and sclerosis.   Limited visualization of the bilateral SI joints is normal.   Multiple surgical clips overlie the lower abdomen and  pelvis bilaterally. Post cholecystectomy. Nonobstructive bowel gas pattern.   IMPRESSION: Mild-to-moderate multilevel lumbar spine DDD, worse at L5-S1.     Electronically Signed   By: Sandi Mariscal M.D.   On: 04/25/2021 11:26  EXAM: SACRUM AND COCCYX - 2+ VIEW   COMPARISON:  None.   FINDINGS: No definite displaced sacral or coccygeal fracture. Limited visualization of the bilateral SI joints, hips and pubic symphysis is normal.   Degenerative change of the lower lumbar spine is suspected though incompletely evaluated.   Multiple surgical clips overlie the midline of the lower abdomen and pelvis bilaterally.   IMPRESSION: 1. No definite displaced sacral or coccygeal fracture. 2. Degenerative change of the lower lumbar spine is suspected though incompletely evaluated.     Electronically Signed   By: Sandi Mariscal M.D.   On: 04/25/2021 11:27  I, [name], personally (independently) visualized and performed the interpretation of the images attached in this note.     Assessment and Plan: 65 y.o. female with chronic low back pain.  Multifactorial.  Patient does have degenerative changes on lumbar spine x-ray as above however I do believe a lot of her back pain is due to significant core weakness due to her cancer and surgical history.  She has had improvement with physical therapy but due to COVID did not have a complete course of PT.  Stressed the importance of ongoing home exercise program.  Happy to reauthorize physical therapy in the future if needed.  Additionally spent time  today discussing potential for in the future MRI for potential facet injection planning. Refill tizanidine to use as needed.  Recheck back as needed.   PDMP not reviewed this encounter. No orders of the defined types were placed in this encounter.  Meds ordered this encounter  Medications   tiZANidine (ZANAFLEX) 4 MG tablet    Sig: Take 0.5-1 tablets (2-4 mg total) by mouth every 8 (eight) hours  as needed for muscle spasms.    Dispense:  60 tablet    Refill:  1     Discussed warning signs or symptoms. Please see discharge instructions. Patient expresses understanding.   The above documentation has been reviewed and is accurate and complete [name], M.D.  Total encounter time 20 minutes including face-to-face time with the patient and, reviewing past medical record, and charting on the date of service.   Treatment plan and option and reviewed imaging.

## 2021-06-03 ENCOUNTER — Other Ambulatory Visit: Payer: Self-pay

## 2021-06-03 ENCOUNTER — Encounter (INDEPENDENT_AMBULATORY_CARE_PROVIDER_SITE_OTHER): Payer: Self-pay | Admitting: Family Medicine

## 2021-06-03 ENCOUNTER — Ambulatory Visit (INDEPENDENT_AMBULATORY_CARE_PROVIDER_SITE_OTHER): Payer: BC Managed Care – PPO | Admitting: Family Medicine

## 2021-06-03 VITALS — BP 123/73 | HR 72 | Temp 97.6°F | Ht 62.0 in | Wt 210.0 lb

## 2021-06-03 DIAGNOSIS — Z6838 Body mass index (BMI) 38.0-38.9, adult: Secondary | ICD-10-CM

## 2021-06-03 DIAGNOSIS — E1169 Type 2 diabetes mellitus with other specified complication: Secondary | ICD-10-CM

## 2021-06-03 MED ORDER — SEMAGLUTIDE (1 MG/DOSE) 4 MG/3ML ~~LOC~~ SOPN: 1.0000 mg | PEN_INJECTOR | SUBCUTANEOUS | 0 refills | Status: DC

## 2021-06-03 NOTE — Progress Notes (Signed)
Chief Complaint:   OBESITY Devin is here to discuss her progress with her obesity treatment plan along with follow-up of her obesity related diagnoses. Faryn is on keeping a food journal and adhering to recommended goals of 1200-1300 calories and 75+ grams of protein daily and states she is following her eating plan approximately 75-80% of the time. Mayan states she is doing physical therapy for 45 minutes.  Today's visit was #: 34 Starting weight: 208 lbs Starting date: 04/05/2018 Today's weight: 210 lbs Today's date: 06/03/2021 Total lbs lost to date: 0 Total lbs lost since last in-office visit: 4  Interim History: Mehar has done well with weight loss. She is recovering from COVID-19 and is slowly getting her appetite back.  Subjective:   1. Type 2 diabetes mellitus with other specified complication, without long-term current use of insulin (HCC) Aloise is working on diet and exercise has struggled to meet her nutrition goals while sick, but she is starting to feel better.  Assessment/Plan:   1. Type 2 diabetes mellitus with other specified complication, without long-term current use of insulin (HCC) We will refill Ozempic for 1 month. Symantha will continue with diet and exercise. Good blood sugar control is important to decrease the likelihood of diabetic complications such as nephropathy, neuropathy, limb loss, blindness, coronary artery disease, and death. Intensive lifestyle modification including diet, exercise and weight loss are the first line of treatment for diabetes.   - Semaglutide, 1 MG/DOSE, 4 MG/3ML SOPN; Inject 1 mg as directed once a week.  Dispense: 9 mL; Refill: 0  2. Obesity with current BMI of 38.4 Tamanika is currently in the action stage of change. As such, her goal is to continue with weight loss efforts. She has agreed to keeping a food journal and adhering to recommended goals of 1200-1300 calories and 75+ grams of protein daily.   Protein supplements are ok to  take until her appetite comes back.  Exercise goals: As is.  Behavioral modification strategies: increasing lean protein intake and holiday eating strategies .  Shai has agreed to follow-up with our clinic in 4 weeks. She was informed of the importance of frequent follow-up visits to maximize her success with intensive lifestyle modifications for her multiple health conditions.   Objective:   Blood pressure 123/73, pulse 72, temperature 97.6 F (36.4 C), height 5\' 2"  (1.575 m), weight 210 lb (95.3 kg), SpO2 97 %. Body mass index is 38.41 kg/m.  General: Cooperative, alert, well developed, in no acute distress. HEENT: Conjunctivae and lids unremarkable. Cardiovascular: Regular rhythm.  Lungs: Normal work of breathing. Neurologic: No focal deficits.   Lab Results  Component Value Date   CREATININE 1.86 (H) 02/26/2021   BUN 21 02/26/2021   NA 144 02/26/2021   K 4.4 02/26/2021   CL 106 02/26/2021   CO2 23 02/26/2021   Lab Results  Component Value Date   ALT 33 (H) 06/16/2020   AST 25 06/16/2020   ALKPHOS 175 (H) 06/16/2020   BILITOT 0.3 06/16/2020   Lab Results  Component Value Date   HGBA1C 6.3 (H) 06/16/2020   HGBA1C 7.9 (H) 09/01/2017   HGBA1C 5.3 04/01/2017   HGBA1C 5.8 (H) 09/05/2015   HGBA1C 6.8 (H) 11/21/2014   Lab Results  Component Value Date   INSULIN 14.1 06/16/2020   Lab Results  Component Value Date   TSH 3.38 01/18/2020   Lab Results  Component Value Date   CHOL 92 (L) 06/16/2020   HDL 49 06/16/2020  [place] 21 06/16/2020   TRIG 128 06/16/2020   CHOLHDL 1.9 01/18/2020   Lab Results  Component Value Date   VD25OH 25.9 (L) 06/16/2020   VD25OH 40.8 04/05/2018   Lab Results  Component Value Date   WBC 5.4 02/26/2021   HGB 11.9 02/26/2021   HCT 37.3 02/26/2021   MCV 95 02/26/2021   PLT 218 02/26/2021   Lab Results  Component Value Date   IRON 7 (L) 08/31/2017   TIBC 164 (L) 08/31/2017   FERRITIN 27 11/29/2014   Attestation  Statements:   Reviewed by clinician on day of visit: allergies, medications, problem list, medical history, surgical history, family history, social history, and previous encounter notes.  Time spent on visit including pre-visit chart review and post-visit care and charting was 30 minutes.    I, Trixie Dredge, am acting as transcriptionist for Dennard Nip, MD.  I have reviewed the above documentation for accuracy and completeness, and I agree with the above. -  Dennard Nip, MD

## 2021-06-19 ENCOUNTER — Other Ambulatory Visit (HOSPITAL_BASED_OUTPATIENT_CLINIC_OR_DEPARTMENT_OTHER): Payer: Self-pay

## 2021-06-19 MED ORDER — FLUAD QUADRIVALENT 0.5 ML IM PRSY
PREFILLED_SYRINGE | INTRAMUSCULAR | 0 refills | Status: DC
  Filled 2021-06-19: qty 0.5, 1d supply, fill #0

## 2021-06-23 DIAGNOSIS — Z936 Other artificial openings of urinary tract status: Secondary | ICD-10-CM | POA: Diagnosis not present

## 2021-06-23 DIAGNOSIS — Z4801 Encounter for change or removal of surgical wound dressing: Secondary | ICD-10-CM | POA: Diagnosis not present

## 2021-06-23 DIAGNOSIS — L89319 Pressure ulcer of right buttock, unspecified stage: Secondary | ICD-10-CM | POA: Diagnosis not present

## 2021-06-23 DIAGNOSIS — Z933 Colostomy status: Secondary | ICD-10-CM | POA: Diagnosis not present

## 2021-07-02 ENCOUNTER — Encounter (INDEPENDENT_AMBULATORY_CARE_PROVIDER_SITE_OTHER): Payer: Self-pay | Admitting: Family Medicine

## 2021-07-02 ENCOUNTER — Other Ambulatory Visit: Payer: Self-pay

## 2021-07-02 ENCOUNTER — Ambulatory Visit (INDEPENDENT_AMBULATORY_CARE_PROVIDER_SITE_OTHER): Payer: BC Managed Care – PPO | Admitting: Family Medicine

## 2021-07-02 VITALS — BP 144/78 | HR 73 | Temp 97.8°F | Ht 62.0 in | Wt 215.0 lb

## 2021-07-02 DIAGNOSIS — L89319 Pressure ulcer of right buttock, unspecified stage: Secondary | ICD-10-CM | POA: Diagnosis not present

## 2021-07-02 DIAGNOSIS — H02225 Mechanical lagophthalmos left lower eyelid: Secondary | ICD-10-CM | POA: Diagnosis not present

## 2021-07-02 DIAGNOSIS — H04223 Epiphora due to insufficient drainage, bilateral lacrimal glands: Secondary | ICD-10-CM | POA: Diagnosis not present

## 2021-07-02 DIAGNOSIS — E1169 Type 2 diabetes mellitus with other specified complication: Secondary | ICD-10-CM

## 2021-07-02 DIAGNOSIS — Z9189 Other specified personal risk factors, not elsewhere classified: Secondary | ICD-10-CM

## 2021-07-02 DIAGNOSIS — H02135 Senile ectropion of left lower eyelid: Secondary | ICD-10-CM | POA: Diagnosis not present

## 2021-07-02 DIAGNOSIS — Z933 Colostomy status: Secondary | ICD-10-CM | POA: Diagnosis not present

## 2021-07-02 DIAGNOSIS — H02132 Senile ectropion of right lower eyelid: Secondary | ICD-10-CM | POA: Diagnosis not present

## 2021-07-02 DIAGNOSIS — Z6838 Body mass index (BMI) 38.0-38.9, adult: Secondary | ICD-10-CM | POA: Diagnosis not present

## 2021-07-02 DIAGNOSIS — H02535 Eyelid retraction left lower eyelid: Secondary | ICD-10-CM | POA: Diagnosis not present

## 2021-07-02 DIAGNOSIS — H02532 Eyelid retraction right lower eyelid: Secondary | ICD-10-CM | POA: Diagnosis not present

## 2021-07-02 DIAGNOSIS — H04553 Acquired stenosis of bilateral nasolacrimal duct: Secondary | ICD-10-CM | POA: Diagnosis not present

## 2021-07-02 DIAGNOSIS — F3289 Other specified depressive episodes: Secondary | ICD-10-CM | POA: Diagnosis not present

## 2021-07-02 DIAGNOSIS — H02222 Mechanical lagophthalmos right lower eyelid: Secondary | ICD-10-CM | POA: Diagnosis not present

## 2021-07-02 MED ORDER — SEMAGLUTIDE (1 MG/DOSE) 4 MG/3ML ~~LOC~~ SOPN: 1.0000 mg | PEN_INJECTOR | SUBCUTANEOUS | 0 refills | Status: DC

## 2021-07-02 MED ORDER — BUPROPION HCL ER (SR) 150 MG PO TB12: 150.0000 mg | ORAL_TABLET | Freq: Two times a day (BID) | ORAL | 0 refills | Status: DC

## 2021-07-02 NOTE — Progress Notes (Signed)
Chief Complaint:   OBESITY [name] is here to discuss her progress with her obesity treatment plan along with follow-up of her obesity related diagnoses. [name] is on keeping a food journal and adhering to recommended goals of 1200-1300 calories and 75+ grams of protein daily and states she is following her eating plan approximately 50% of the time. [name] states she is walking for 30 minutes 5 times per week.  Today's visit was #: 73 Starting weight: 208 lbs Starting date: 04/05/2018 Today's weight: 215 lbs Today's date: 07/02/2021 Total lbs lost to date: 0 Total lbs lost since last in-office visit: 0  Interim History: [name] traveled over the holidays and she had to eat out more. She notes her sodium level was getting increased. Most of her weight gain appears to be due to fluid retention.   Subjective:   1. Type 2 diabetes mellitus with other specified complication, without long-term current use of insulin (HCC) [name] continues to work on her diet. She has increased her vegetables and protein. No side effects were noted.  2. Other depression, with emotional eating [name] is on Wellbutrin and she is working on decreasing emotional eating behaviors.  3. At risk for fluid volume overload [name] is at a higher than average risk for fluid retention due to obesity. Reviewed: no chest pain on exertion, no dyspnea at rest, and no swelling of ankles.  Assessment/Plan:   1. Type 2 diabetes mellitus with other specified complication, without long-term current use of insulin (HCC) We will refill Ozempic for 90 days with no refills. [name] will continue to follow up as directed. Good blood sugar control is important to decrease the likelihood of diabetic complications such as nephropathy, neuropathy, limb loss, blindness, coronary artery disease, and death. Intensive lifestyle modification including diet, exercise and weight loss are the first line of treatment for diabetes.   - Semaglutide, 1  MG/DOSE, 4 MG/3ML SOPN; Inject 1 mg as directed once a week.  Dispense: 9 mL; Refill: 0  2. Other depression, with emotional eating Behavior modification techniques were discussed today to help [name] deal with her emotional/non-hunger eating behaviors. We will refill Wellbutrin SR for 90 days with no refills. Orders and follow up as documented in patient record.   - buPROPion (WELLBUTRIN SR) 150 MG 12 hr tablet; Take 1 tablet (150 mg total) by mouth 2 (two) times daily.  Dispense: 180 tablet; Refill: 0  3. At risk for fluid volume overload [name] was given approximately 15 minutes of fluid retention prevention counseling today. She is 65 y.o. female and has risk factors for fluid retention including obesity. We discussed intensive lifestyle modifications today with an emphasis on specific weight loss instructions, proper nutrition and exercise strategies.   Repetitive spaced learning was employed today to elicit superior memory formation and behavioral change.   4. Obesity with current BMI of 39.5 [name] is currently in the action stage of change. As such, her goal is to continue with weight loss efforts. She has agreed to keeping a food journal and adhering to recommended goals of 1200-1300 calories and 75+ grams of protein daily.   Exercise goals: As is.  Behavioral modification strategies: increasing water intake and holiday eating strategies .  [name] has agreed to follow-up with our clinic in 4 weeks. She was informed of the importance of frequent follow-up visits to maximize her success with intensive lifestyle modifications for her multiple health conditions.   Objective:   Blood pressure (!) 144/78, pulse 73, temperature  97.8 F (36.6 C), height 5\' 2"  (1.575 m), weight 215 lb (97.5 kg), SpO2 100 %. Body mass index is 39.32 kg/m.  General: Cooperative, alert, well developed, in no acute distress. HEENT: Conjunctivae and lids unremarkable. Cardiovascular: Regular rhythm.  Lungs:  Normal work of breathing. Neurologic: No focal deficits.   Lab Results  Component Value Date   CREATININE 1.86 (H) 02/26/2021   BUN 21 02/26/2021   NA 144 02/26/2021   K 4.4 02/26/2021   CL 106 02/26/2021   CO2 23 02/26/2021   Lab Results  Component Value Date   ALT 33 (H) 06/16/2020   AST 25 06/16/2020   ALKPHOS 175 (H) 06/16/2020   BILITOT 0.3 06/16/2020   Lab Results  Component Value Date   HGBA1C 6.3 (H) 06/16/2020   HGBA1C 7.9 (H) 09/01/2017   HGBA1C 5.3 04/01/2017   HGBA1C 5.8 (H) 09/05/2015   HGBA1C 6.8 (H) 11/21/2014   Lab Results  Component Value Date   INSULIN 14.1 06/16/2020   Lab Results  Component Value Date   TSH 3.38 01/18/2020   Lab Results  Component Value Date   CHOL 92 (L) 06/16/2020   HDL 49 06/16/2020   LDLCALC 21 06/16/2020   TRIG 128 06/16/2020   CHOLHDL 1.9 01/18/2020   Lab Results  Component Value Date   VD25OH 25.9 (L) 06/16/2020   VD25OH 40.8 04/05/2018   Lab Results  Component Value Date   WBC 5.4 02/26/2021   HGB 11.9 02/26/2021   HCT 37.3 02/26/2021   MCV 95 02/26/2021   PLT 218 02/26/2021   Lab Results  Component Value Date   IRON 7 (L) 08/31/2017   TIBC 164 (L) 08/31/2017   FERRITIN 27 11/29/2014   Attestation Statements:   Reviewed by clinician on day of visit: allergies, medications, problem list, medical history, surgical history, family history, social history, and previous encounter notes.   I, Trixie Dredge, am acting as transcriptionist for Dennard Nip, MD.  I have reviewed the above documentation for accuracy and completeness, and I agree with the above. -  Dennard Nip, MD

## 2021-07-03 DIAGNOSIS — Z933 Colostomy status: Secondary | ICD-10-CM | POA: Diagnosis not present

## 2021-07-10 ENCOUNTER — Ambulatory Visit (INDEPENDENT_AMBULATORY_CARE_PROVIDER_SITE_OTHER): Payer: BC Managed Care – PPO | Admitting: Interventional Cardiology

## 2021-07-10 ENCOUNTER — Encounter: Payer: Self-pay | Admitting: Interventional Cardiology

## 2021-07-10 ENCOUNTER — Other Ambulatory Visit: Payer: Self-pay

## 2021-07-10 VITALS — BP 112/60 | HR 67 | Ht 62.0 in | Wt 216.2 lb

## 2021-07-10 DIAGNOSIS — E785 Hyperlipidemia, unspecified: Secondary | ICD-10-CM | POA: Diagnosis not present

## 2021-07-10 DIAGNOSIS — N183 Chronic kidney disease, stage 3 unspecified: Secondary | ICD-10-CM

## 2021-07-10 DIAGNOSIS — I10 Essential (primary) hypertension: Secondary | ICD-10-CM

## 2021-07-10 DIAGNOSIS — I480 Paroxysmal atrial fibrillation: Secondary | ICD-10-CM | POA: Diagnosis not present

## 2021-07-10 DIAGNOSIS — N184 Chronic kidney disease, stage 4 (severe): Secondary | ICD-10-CM

## 2021-07-10 DIAGNOSIS — R0609 Other forms of dyspnea: Secondary | ICD-10-CM | POA: Diagnosis not present

## 2021-07-10 DIAGNOSIS — E1122 Type 2 diabetes mellitus with diabetic chronic kidney disease: Secondary | ICD-10-CM

## 2021-07-10 MED ORDER — APIXABAN 5 MG PO TABS: 5.0000 mg | ORAL_TABLET | Freq: Two times a day (BID) | ORAL | 1 refills | Status: DC

## 2021-07-10 NOTE — Patient Instructions (Addendum)
Medication Instructions:  Your physician recommends that you continue on your current medications as directed. Please refer to the Current Medication list given to you today.  *If you need a refill on your cardiac medications before your next appointment, please call your pharmacy*   Lab Work: BMET and CBC on 12/19 anytime between 7:30am-4:30pm  If you have labs (blood work) drawn today and your tests are completely normal, you will receive your results only by: [facility] (if you have MyChart) OR A paper copy in the mail If you have any lab test that is abnormal or we need to change your treatment, we will call you to review the results.   Testing/Procedures: None   Follow-Up: At [facility], you and your health needs are our priority.  As part of our continuing mission to provide you with exceptional heart care, we have created designated Provider Care Teams.  These Care Teams include your primary Cardiologist (physician) and Advanced Practice Providers (APPs -  Physician Assistants and Nurse Practitioners) who all work together to provide you with the care you need, when you need it.  We recommend signing up for the patient portal called "MyChart".  Sign up information is provided on this After Visit Summary.  MyChart is used to connect with patients for Virtual Visits (Telemedicine).  Patients are able to view lab/test results, encounter notes, upcoming appointments, etc.  Non-urgent messages can be sent to your provider as well.   To learn more about what you can do with MyChart, go to [email or web address].    Your next appointment:   6 month(s)  The format for your next appointment:   In Person  Provider:   Sinclair Grooms, MD     Other Instructions

## 2021-07-10 NOTE — Progress Notes (Signed)
Cardiology Office Note:    Date:  07/10/2021   ID:  [name], DOB [birth year], MRN [id number]  PCP:  Ann Held, DO  Cardiologist:  Sinclair Grooms, MD   Referring MD: Carollee Herter, Alferd Apa, *   Chief Complaint  Patient presents with   Atrial Fibrillation    History of Present Illness:    [name] is a 65 y.o. female with a hx of  DOE, PAF (1% burden), hyperlipidemia, DM II, Essential hypertension, CKD 3, and first degree AVB.  Doing okay.  No significant bleeding.  Tolerating apixaban without trouble.  Has occasional fistula oozing of blood.  No major bleeding.  Occasional palpitations.  Feels that she has occasional atrial fibrillation.  No neurological complaints.  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+, HER2 negative/  12-11-2015 bilateral mastecotmy w/ reconstruction (no radiation and no chemo)   Cancer of corpus uteri, except [facility])  oncologist-- dr Denman [name] and dr Alvy Bimler    10-15-2004  dx endometroid endometrial and ovarian cancer s/p  chemotheapy and surgery(TAH w/ BSO) :  recurrent 11-19-2014 post pelvic surgery and radiation 01-29-2015 to 03-10-2015   Chronic idiopathic neutropenia ([place])    presumed related to chemotherapy March 2006--- followed by dr Alvy Bimler (treatment w/ G-CSF injections   Chronic nausea    Chronic pain    perineal/ anal  area from bladder pad irritates skin , right flank pain   CKD stage G2/A3, GFR 60-89 and albumin creatinine ratio >300 mg/g    nephrologist-  dr Madelon Lips   Colovesical fistula    Diabetic retinopathy, background ([place])    Difficult intravenous access    small veins--- hx PICC lines   DM type 2 (diabetes mellitus, type 2) ([place])     monitored by dr Legrand Como altheimer   Dysuria    Environmental and seasonal allergies    Fatty liver 02/08/2016   CT   Generalized muscle weakness    GERD (gastroesophageal reflux disease)    Hiatal hernia    History of abdominal abscess 04/16/2017   post surgery 04-01-2017  --- resolved 10/ 2018   History of gastric polyp    2014  duodenum   History of ileus 04/16/2017   resolved w/ no surgical intervention   History of radiation therapy    01-29-2015 to 03-10-2015  pelvis 50.4Gy   Hypothyroidism    monitored by dr Legrand Como altheimer   IBS (irritable bowel syndrome)    Ileostomy in place ([place]) 04/01/2017   created at same time colostomy takedown.   Joint pain    Leg edema    Lower urinary tract symptoms (LUTS)    urge urinary  incontinence   Mixed dyslipidemia    Multiple thyroid nodules    Managed by Dr. Harlow Asa   Nephrostomy status [facility])    Palpitations    Pelvic abscess in female 04/16/2017   PONV (postoperative nausea and vomiting)    "scopolamine patch works for me"   Radiation-induced dermatitis    contact dermatitis , radiation completed, rash only on ankles now.   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   Vitamin D deficiency    Wears glasses     Past Surgical History:  Procedure Laterality Date   APPENDECTOMY     biopsy thyroid nodules     BREAST RECONSTRUCTION WITH PLACEMENT OF TISSUE EXPANDER AND FLEX HD (ACELLULAR HYDRATED DERMIS) Bilateral 12/11/2015   Procedure: BILATERAL BREAST RECONSTRUCTION WITH PLACEMENT OF TISSUE EXPANDERS;  Surgeon: Irene Limbo, MD;  Location: [place];  Service: Plastics;  Laterality: Bilateral;   COLONOSCOPY WITH PROPOFOL N/A 08/21/2013   Procedure: COLONOSCOPY WITH PROPOFOL;  Surgeon: Cleotis Nipper, MD;  [facility];  Service: Endoscopy;  Laterality: N/A;    COLOSTOMY TAKEDOWN N/A 12/04/2014   Procedure: LAPROSCOPIC LYSIS OF ADHESIONS, SPLENIC MOBILIZATION, RELOCATION OF COLOSTOMY, DEBRIDEMENT INITIAL COLOSTOMY SITE;  Surgeon: Michael [name], MD;  [facility];  Service: General;  Laterality: N/A;   CYSTOGRAM N/A 06/01/2017   Procedure: CYSTOGRAM;  Surgeon: Alexis Frock, MD;  [facility];  Service: Urology;  Laterality: N/A;   CYSTOSCOPY W/ RETROGRADES Right 11/21/2015   Procedure: CYSTOSCOPY WITH RETROGRADE PYELOGRAM;  Surgeon: Alexis Frock, MD;  [facility];  Service: Urology;  Laterality: Right;   CYSTOSCOPY W/ URETERAL STENT PLACEMENT Right 11/21/2015   Procedure: CYSTOSCOPY WITH STENT REPLACEMENT;  Surgeon: Alexis Frock, MD;  [facility];  Service: Urology;  Laterality: Right;   CYSTOSCOPY W/ URETERAL STENT PLACEMENT Right 03/10/2016   Procedure: CYSTOSCOPY WITH STENT REPLACEMENT;  Surgeon: Alexis Frock, MD;  Location: [facility];  Service: Urology;  Laterality: Right;   CYSTOSCOPY W/ URETERAL STENT PLACEMENT Right 06/30/2016   Procedure: CYSTOSCOPY WITH RETROGRADE PYELOGRAM/URETERAL STENT EXCHANGE;  Surgeon: Alexis Frock, MD;  Location: [facility];  Service: Urology;  Laterality: Right;   CYSTOSCOPY W/ URETERAL STENT PLACEMENT N/A 06/01/2017   Procedure: CYSTOSCOPY WITH EXAM UNDER ANESTHESIA;  Surgeon: Alexis Frock, MD;  [facility];  Service: Urology;  Laterality: N/A;   CYSTOSCOPY W/ URETERAL STENT PLACEMENT Right 08/17/2017   Procedure: CYSTOSCOPY WITH RETROGRADE PYELOGRAM/URETERAL STENT REMOVAL;  Surgeon: Alexis Frock, MD;  [facility];  Service: Urology;  Laterality: Right;   CYSTOSCOPY WITH RETROGRADE PYELOGRAM, URETEROSCOPY AND STENT PLACEMENT Right 03/20/2015   Procedure: CYSTOSCOPY WITH RETROGRADE PYELOGRAM, URETEROSCOPY WITH BALLOON DILATION AND STENT PLACEMENT ON RIGHT;  Surgeon: Alexis Frock, MD;  Location: [facility];  Service:  Urology;  Laterality: Right;   CYSTOSCOPY WITH RETROGRADE PYELOGRAM, URETEROSCOPY AND STENT PLACEMENT Right 05/02/2015   Procedure: CYSTOSCOPY WITH RIGHT RETROGRADE PYELOGRAM,  DIAGNOSTIC URETEROSCOPY AND STENT PULL ;  Surgeon: Alexis Frock, MD;  Location[facility];  Service: Urology;  Laterality: Right;   CYSTOSCOPY WITH RETROGRADE PYELOGRAM, URETEROSCOPY AND STENT PLACEMENT Right 09/05/2015   Procedure: CYSTOSCOPY WITH RETROGRADE PYELOGRAM,  AND STENT PLACEMENT;  Surgeon: Alexis Frock, MD;  [facility];  Service: Urology;  Laterality: Right;   CYSTOSCOPY WITH RETROGRADE PYELOGRAM, URETEROSCOPY AND STENT PLACEMENT Right 04/01/2017   Procedure: CYSTOSCOPY WITH RETROGRADE PYELOGRAM, URETEROSCOPY AND STENT PLACEMENT;  Surgeon: Alexis Frock, MD;  [facility];  Service: Urology;  Laterality: Right;   CYSTOSCOPY WITH STENT PLACEMENT Right 10/27/2016   Procedure: CYSTOSCOPY WITH STENT CHANGE and right retrograde pyelogram;  Surgeon: Alexis Frock, MD;  Location: [facility];  Service: Urology;  Laterality: Right;   EUS N/A 10/02/2014   Procedure: LOWER ENDOSCOPIC ULTRASOUND (EUS);  Surgeon: Arta Silence, MD;  [facility];  Service: Endoscopy;  Laterality: N/A;   EXCISION SOFT TISSUE MASS RIGHT FOREMAN  12-08-2006   EYE SURGERY  as child   pytosis of eyelids repair   INCISION AND DRAINAGE OF WOUND Bilateral 12/26/2015   Procedure: DEBRIDEMENT OF BILATERAL MASTECTOMY FLAPS;  Surgeon: Irene Limbo, MD;  Location: [place];  Service: Plastics;  Laterality: Bilateral;   IR CV LINE INJECTION  05/31/2017   IR FLUORO GUIDE CV LINE LEFT  05/31/2017   IR FLUORO GUIDE CV LINE RIGHT  04/06/2017   IR FLUORO GUIDE CV MIDLINE PICC RIGHT  05/30/2017   IR NEPHROSTOGRAM LEFT INITIAL PLACEMENT  09/02/2017   IR NEPHROSTOGRAM LEFT THRU EXISTING ACCESS  11/29/2017   IR NEPHROSTOGRAM RIGHT INITIAL PLACEMENT  09/02/2017   IR NEPHROSTOGRAM RIGHT THRU  EXISTING ACCESS  09/13/2017   IR NEPHROSTOGRAM RIGHT THRU EXISTING ACCESS  11/29/2017   IR NEPHROSTOMY EXCHANGE LEFT  11/28/2017   IR NEPHROSTOMY EXCHANGE LEFT  01/05/2018   IR NEPHROSTOMY EXCHANGE LEFT  02/16/2018   IR NEPHROSTOMY EXCHANGE LEFT  03/30/2018   IR NEPHROSTOMY EXCHANGE LEFT  05/12/2018   IR NEPHROSTOMY EXCHANGE LEFT  06/21/2018   IR NEPHROSTOMY EXCHANGE LEFT  08/04/2018   IR NEPHROSTOMY EXCHANGE LEFT  09/18/2018   IR NEPHROSTOMY EXCHANGE LEFT  10/09/2018   IR NEPHROSTOMY EXCHANGE LEFT  10/27/2018   IR NEPHROSTOMY EXCHANGE LEFT  11/21/2018   IR NEPHROSTOMY EXCHANGE LEFT  01/05/2019   IR NEPHROSTOMY EXCHANGE LEFT  02/15/2019   IR NEPHROSTOMY EXCHANGE LEFT  03/29/2019   IR NEPHROSTOMY EXCHANGE RIGHT  10/02/2017   IR NEPHROSTOMY EXCHANGE RIGHT  11/28/2017   IR NEPHROSTOMY EXCHANGE RIGHT  01/05/2018   IR NEPHROSTOMY EXCHANGE RIGHT  02/16/2018   IR NEPHROSTOMY EXCHANGE RIGHT  03/30/2018   IR NEPHROSTOMY EXCHANGE RIGHT  05/12/2018   IR NEPHROSTOMY EXCHANGE RIGHT  06/21/2018   IR NEPHROSTOMY EXCHANGE RIGHT  08/04/2018   IR NEPHROSTOMY EXCHANGE RIGHT  09/18/2018   IR NEPHROSTOMY EXCHANGE RIGHT  10/27/2018   IR NEPHROSTOMY EXCHANGE RIGHT  11/21/2018   IR NEPHROSTOMY EXCHANGE RIGHT  01/05/2019   IR NEPHROSTOMY EXCHANGE RIGHT  02/15/2019   IR NEPHROSTOMY EXCHANGE RIGHT  03/29/2019   IR NEPHROSTOMY PLACEMENT LEFT  10/02/2017   IR RADIOLOGIST EVAL & MGMT  05/03/2017   IR US GUIDE VASC ACCESS LEFT  05/31/2017   IR US GUIDE VASC ACCESS RIGHT  04/06/2017   IR US GUIDE VASC ACCESS RIGHT  05/30/2017   LAPAROSCOPIC CHOLECYSTECTOMY  1990   LIPOSUCTION WITH LIPOFILLING Bilateral 04/16/2016   Procedure: LIPOSUCTION WITH LIPOFILLING TO BILATERAL CHEST;  Surgeon: Irene Limbo, MD;  Location: [place];  Service: Plastics;  Laterality: Bilateral;   MASTECTOMY W/ SENTINEL NODE BIOPSY Bilateral 12/11/2015   Procedure: RIGHT PROPHYLACTIC MASTECTOMY, LEFT TOTAL MASTECTOMY WITH LEFT AXILLARY SENTINEL LYMPH NODE  BIOPSY;  Surgeon: Stark Klein, MD;  Location: [place];  Service: General;  Laterality: Bilateral;   OSTOMY N/A 11/19/2014   Procedure: OSTOMY;  Surgeon: Michael [name], MD;  [facility];  Service: General;  Laterality: N/A;   PROCTOSCOPY N/A 04/01/2017   Procedure: RIDGE PROCTOSCOPY;  Surgeon: Michael [name], MD;  [facility];  Service: General;  Laterality: N/A;   REMOVAL OF BILATERAL TISSUE EXPANDERS WITH PLACEMENT OF BILATERAL BREAST IMPLANTS Bilateral 04/16/2016   Procedure: REMOVAL OF BILATERAL TISSUE EXPANDERS WITH PLACEMENT OF BILATERAL BREAST IMPLANTS;  Surgeon: Irene Limbo, MD;  Location: [place];  Service: Plastics;  Laterality: Bilateral;  ROBOTIC ASSISTED LAP VAGINAL HYSTERECTOMY N/A 11/19/2014   Procedure: ROBOTIC LYSIS OF ADHESIONS, CONVERTED TO LAPAROTOMY RADICAL UPPER VAGINECTOMY,LOW ANTERIOR BOWEL RESECTION, COLOSTOMY, BILATERAL URETERAL STENT PLACEMENT AND CYSTONOMY CLOSURE;  Surgeon: Everitt Amber, MD;  [facility];  Service: Gynecology;  Laterality: N/A;   TISSUE EXPANDER FILLING Bilateral 12/26/2015   Procedure: EXPANSION OF BILATERAL CHEST TISSUE EXPANDERS (60 mL- Right; 75 mL- Left);  Surgeon: Irene Limbo, MD;  Location: [place];  Service: Plastics;  Laterality: Bilateral;   TONSILLECTOMY     TOTAL ABDOMINAL HYSTERECTOMY  March 2006   [facility]   and Bilateral Salpingoophorectomy/  staging for Ovarian cancer/  an   XI ROBOTIC ASSISTED LOWER ANTERIOR RESECTION N/A 04/01/2017   Procedure: XI ROBOTIC VS LAPAROSCOPIC COLOSTOMY TAKEDOWN WITH LYSIS OF ADHESIONS.;  Surgeon: Michael [name], MD;  [facility];  Service: General;  Laterality: N/A;  ERAS PATHWAY    Current Medications: Current Meds  Medication Sig   acetaminophen (TYLENOL) 325 MG tablet Take 2 tablets (650 mg total) by mouth every 6 (six) hours as needed.   albuterol (PROVENTIL HFA) 108 (90 Base) MCG/ACT inhaler Inhale 1 puff into the lungs every 6 (six) hours as  needed for wheezing or shortness of breath.   apixaban (ELIQUIS) 5 MG TABS tablet Take 1 tablet (5 mg total) by mouth 2 (two) times daily.   Biotin 5 MG TABS Take 5 mg by mouth daily.    buPROPion (WELLBUTRIN SR) 150 MG 12 hr tablet Take 1 tablet (150 mg total) by mouth 2 (two) times daily.   calcium citrate-vitamin D (CITRACAL+D) 315-200 MG-UNIT tablet Take 1 tablet by mouth daily.   Cholecalciferol (VITAMIN D3) 10000 UNITS capsule Take 10,000 Units by mouth See admin instructions. Take 1 tablet by mouth 4 times per week.   clobetasol (OLUX) 0.05 % topical foam Apply topically 2 (two) times daily.   diphenhydrAMINE (BENADRYL) 25 mg capsule Take 1 capsule (25 mg total) by mouth every 8 (eight) hours as needed for itching, allergies or sleep.   famotidine-calcium carbonate-magnesium hydroxide (PEPCID COMPLETE) 10-800-165 MG chewable tablet Chew by mouth as needed.   ferrous sulfate 325 (65 FE) MG tablet Take 1 tablet (325 mg total) by mouth at bedtime.   HYDROcodone bit-homatropine (HYCODAN) 5-1.5 MG/5ML syrup Take 5 mLs by mouth every 6 (six) hours as needed for cough.   levothyroxine (SYNTHROID, LEVOTHROID) 150 MCG tablet Take 1 tablet (150 mcg total) by mouth daily before breakfast.   loratadine (CLARITIN) 10 MG tablet Take 10 mg by mouth daily.    Melatonin 10 MG TABS Take 1 tablet by mouth at bedtime as needed.   omega-3 acid ethyl esters (LOVAZA) 1 G capsule Take 1 g by mouth 2 (two) times daily.    Polyethyl Glycol-Propyl Glycol (SYSTANE OP) Place 1 drop into both eyes daily as needed (dry eyes).    pregabalin (LYRICA) 50 MG capsule TAKE 1 CAPSULE TWICE A DAY   Prenatal Vit-Fe Fumarate-FA (PRENATAL VITAMIN PO) Take 1 capsule by mouth daily. Takes prenatal because there are no dyes in it   Probiotic Product (ALIGN PO) Take by mouth 2 (two) times daily.   rifaximin (XIFAXAN) 550 MG TABS tablet Take 550 mg by mouth See admin instructions. Take one tablet by mouth three times daily for 14 days  when needed.   rosuvastatin (CRESTOR) 10 MG tablet Take 10 mg by mouth every evening.    Saccharomyces boulardii (FLORASTOR PO) Take 1 capsule by mouth daily.  Semaglutide, 1 MG/DOSE, 4 MG/3ML SOPN Inject 1 mg as directed once a week.   Tbo-Filgrastim (GRANIX) 480 MCG/0.8ML SOSY injection 480 mcg subcutaneous injection every 6 days life long   tiZANidine (ZANAFLEX) 4 MG tablet Take 0.5-1 tablets (2-4 mg total) by mouth every 8 (eight) hours as needed for muscle spasms.     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[employer], sports - retired  Tobacco Use   Smoking status: Never   Smokeless tobacco: Never  Vaping Use   Vaping Use: Never used  Substance and Sexual Activity   Alcohol use: Not Currently   Drug use: No   Sexual activity: Not on file  Other Topics Concern   Not on file  Social History Narrative   Exercise-- has not gotten back into it since cancer came back   Social Determinants of Health   Financial Resource Strain: Not on file  Food Insecurity: Not on file  Transportation Needs: Not on file  Physical Activity: Not on file  Stress: Not on file  Social Connections: Not on file     Family History: The patient's family history includes Brain cancer in her paternal grandfather; Breast cancer in her maternal aunt; Cancer (age of onset: 67) in her mother; Cancer (age of onset: 14) in her father; Diabetes in her brother, father, and sister; Heart disease in her brother and father; Hyperlipidemia in her father; Hypertension in her father  and mother; Hypertension (age of onset: y-10) in her brother; Lymphoma in her paternal aunt; Obesity in her father; Ovarian cancer in an other family member.  ROS:   Please see the history of present illness.    Concerns about bleeding persist.  All other systems reviewed and are negative.  EKGs/Labs/Other Studies Reviewed:    The following studies were reviewed today: No new imaging  EKG:  EKG not repeated  Recent Labs: 02/26/2021: BUN 21; Creatinine, Ser 1.86; Hemoglobin 11.9; Platelets 218; Potassium 4.4; Sodium 144  Recent Lipid Panel    Component Value Date/Time   CHOL 92 (L) 06/16/2020 0834   TRIG 128 06/16/2020 0834   HDL 49 06/16/2020 0834   CHOLHDL 1.9 01/18/2020 1440   VLDL 19.8 08/28/2015 1617   LDLCALC 21 06/16/2020 0834   LDLCALC 13 01/18/2020 1440    Physical Exam:    VS:  BP 112/60   Pulse 67   Ht 5' 2"  (1.575 m)   Wt 216 lb 3.2 oz (98.1 kg)   SpO2 98%   BMI 39.54 kg/m     Wt Readings from Last 3 Encounters:  07/10/21 216 lb 3.2 oz (98.1 kg)  07/02/21 215 lb (97.5 kg)  06/03/21 210 lb (95.3 kg)     GEN: Overweight. No acute distress HEENT: Normal NECK: No JVD. LYMPHATICS: No lymphadenopathy CARDIAC: No murmur. RRR no gallop, or edema. VASCULAR:  Normal Pulses. No bruits. RESPIRATORY:  Clear to auscultation without rales, wheezing or rhonchi  ABDOMEN: Soft, non-tender, non-distended, No pulsatile mass, MUSCULOSKELETAL: No deformity  SKIN: Warm and dry NEUROLOGIC:  Alert and oriented x 3 PSYCHIATRIC:  Normal affect  ASSESSMENT:    1. Paroxysmal atrial fibrillation (HCC)   2. DOE (dyspnea on exertion)   3. Benign essential HTN   4. Hyperlipidemia, unspecified hyperlipidemia type   5. Type 2 diabetes mellitus with stage 4 chronic kidney disease, without long-term current use of insulin (HCC)   6. Stage 3 chronic kidney disease, unspecified whether stage 3a or 3b CKD ([place])    PLAN:    In order of problems listed above:  Likely persisting.   Tolerating well. Not currently an issue.  Consider diastolic dysfunction and addition of SGLT2. Blood pressure control is excellent. Most recent LDL was 21 2021.  Continue Crestor. She is on semaglutide, and diet. Stable.  Creatinine and hemoglobin will be done today or within the next several weeks.  He is to be done twice a year with the patient on Eliquis.   Medication Adjustments/Labs and Tests Ordered: Current medicines are reviewed at length with the patient today.  Concerns regarding medicines are outlined above.  Orders Placed This Encounter  Procedures   Basic metabolic panel   CBC   No orders of the defined types were placed in this encounter.   Patient Instructions  Medication Instructions:  Your physician recommends that you continue on your current medications as directed. Please refer to the Current Medication list given to you today.  *If you need a refill on your cardiac medications before your next appointment, please call your pharmacy*   Lab Work: BMET and CBC   If you have labs (blood work) drawn today and your tests are completely normal, you will receive your results only by: [place] (if you have MyChart) OR A paper copy in the mail If you have any lab test that is abnormal or we need to change your treatment, we will call you to review the results.   Testing/Procedures: None   Follow-Up: At [facility], you and your health needs are our priority.  As part of our continuing mission to provide you with exceptional heart care, we have created designated Provider Care Teams.  These Care Teams include your primary Cardiologist (physician) and Advanced Practice Providers (APPs -  Physician Assistants and Nurse Practitioners) who all work together to provide you with the care you need, when you need it.  We recommend signing up for the patient portal called "MyChart".  Sign up information is provided on this After Visit Summary.  MyChart is used to  connect with patients for Virtual Visits (Telemedicine).  Patients are able to view lab/test results, encounter notes, upcoming appointments, etc.  Non-urgent messages can be sent to your provider as well.   To learn more about what you can do with MyChart, go to [email or web address].    Your next appointment:   6 month(s)  The format for your next appointment:   In Person  Provider:   Sinclair Grooms, MD     Other Instructions     Signed, Sinclair Grooms, MD  07/10/2021 5:02 PM    [place]

## 2021-07-10 NOTE — Addendum Note (Signed)
Addended by: Loren Racer on: 07/10/2021 09:21 PM   Modules accepted: Orders

## 2021-07-14 ENCOUNTER — Encounter: Payer: Self-pay | Admitting: Dermatology

## 2021-07-14 ENCOUNTER — Other Ambulatory Visit: Payer: Self-pay

## 2021-07-14 ENCOUNTER — Ambulatory Visit (INDEPENDENT_AMBULATORY_CARE_PROVIDER_SITE_OTHER): Payer: BC Managed Care – PPO | Admitting: Dermatology

## 2021-07-14 DIAGNOSIS — L570 Actinic keratosis: Secondary | ICD-10-CM | POA: Diagnosis not present

## 2021-07-14 DIAGNOSIS — L814 Other melanin hyperpigmentation: Secondary | ICD-10-CM | POA: Diagnosis not present

## 2021-07-14 DIAGNOSIS — L700 Acne vulgaris: Secondary | ICD-10-CM | POA: Diagnosis not present

## 2021-07-14 MED ORDER — ARAZLO 0.045 % EX LOTN: 1.0000 "application " | TOPICAL_LOTION | Freq: Every day | CUTANEOUS | 0 refills | Status: AC

## 2021-07-14 NOTE — Patient Instructions (Signed)
Gardiner, Chance  [place], [place] [address]  Phone:  [phone]

## 2021-07-20 ENCOUNTER — Other Ambulatory Visit: Payer: Self-pay

## 2021-07-20 ENCOUNTER — Other Ambulatory Visit: Payer: BC Managed Care – PPO

## 2021-07-20 DIAGNOSIS — I480 Paroxysmal atrial fibrillation: Secondary | ICD-10-CM | POA: Diagnosis not present

## 2021-07-21 LAB — CBC
Hematocrit: 36.9 % (ref 34.0–46.6)
Hemoglobin: 11.8 g/dL (ref 11.1–15.9)
MCH: 30.5 pg (ref 26.6–33.0)
MCHC: 32 g/dL (ref 31.5–35.7)
MCV: 95 fL (ref 79–97)
Platelets: 242 10*3/uL (ref 150–450)
RBC: 3.87 x10E6/uL (ref 3.77–5.28)
RDW: 14.2 % (ref 11.7–15.4)
WBC: 4.7 10*3/uL (ref 3.4–10.8)

## 2021-07-21 LAB — BASIC METABOLIC PANEL
BUN/Creatinine Ratio: 17 (ref 12–28)
BUN: 30 mg/dL — ABNORMAL HIGH (ref 8–27)
CO2: 22 mmol/L (ref 20–29)
Calcium: 9.2 mg/dL (ref 8.7–10.3)
Chloride: 105 mmol/L (ref 96–106)
Creatinine, Ser: 1.78 mg/dL — ABNORMAL HIGH (ref 0.57–1.00)
Glucose: 60 mg/dL — ABNORMAL LOW (ref 70–99)
Potassium: 4.3 mmol/L (ref 3.5–5.2)
Sodium: 140 mmol/L (ref 134–144)
eGFR: 31 mL/min/{1.73_m2} — ABNORMAL LOW (ref >59)

## 2021-07-23 ENCOUNTER — Encounter: Payer: Self-pay | Admitting: Family Medicine

## 2021-07-23 ENCOUNTER — Ambulatory Visit (INDEPENDENT_AMBULATORY_CARE_PROVIDER_SITE_OTHER): Payer: BC Managed Care – PPO | Admitting: Family Medicine

## 2021-07-23 VITALS — BP 126/60 | HR 81 | Temp 98.2°F | Resp 20 | Ht 62.0 in | Wt 218.2 lb

## 2021-07-23 DIAGNOSIS — Z23 Encounter for immunization: Secondary | ICD-10-CM | POA: Diagnosis not present

## 2021-07-23 DIAGNOSIS — G8929 Other chronic pain: Secondary | ICD-10-CM

## 2021-07-23 DIAGNOSIS — R739 Hyperglycemia, unspecified: Secondary | ICD-10-CM

## 2021-07-23 DIAGNOSIS — E1122 Type 2 diabetes mellitus with diabetic chronic kidney disease: Secondary | ICD-10-CM

## 2021-07-23 DIAGNOSIS — E039 Hypothyroidism, unspecified: Secondary | ICD-10-CM

## 2021-07-23 DIAGNOSIS — I480 Paroxysmal atrial fibrillation: Secondary | ICD-10-CM

## 2021-07-23 DIAGNOSIS — Z6838 Body mass index (BMI) 38.0-38.9, adult: Secondary | ICD-10-CM

## 2021-07-23 DIAGNOSIS — E785 Hyperlipidemia, unspecified: Secondary | ICD-10-CM

## 2021-07-23 DIAGNOSIS — R1900 Intra-abdominal and pelvic swelling, mass and lump, unspecified site: Secondary | ICD-10-CM

## 2021-07-23 DIAGNOSIS — N184 Chronic kidney disease, stage 4 (severe): Secondary | ICD-10-CM

## 2021-07-23 DIAGNOSIS — I10 Essential (primary) hypertension: Secondary | ICD-10-CM

## 2021-07-23 MED ORDER — PREGABALIN 50 MG PO CAPS
50.0000 mg | ORAL_CAPSULE | Freq: Two times a day (BID) | ORAL | 5 refills | Status: DC
Start: 2021-07-23 — End: 2022-01-15

## 2021-07-23 NOTE — Assessment & Plan Note (Signed)
On eliquis  Per cardiology

## 2021-07-23 NOTE — Progress Notes (Signed)
Subjective:   By signing my name below, I, Shehryar Baig, attest that this documentation has been prepared under the direction and in the presence of Dr. [name], DO. 07/23/2021    Patient ID: [name], female    DOB: Mar 09, 1956, 65 y.o.   MRN: [id number]  Chief Complaint  Patient presents with   Hypertension   Hyperlipidemia   Follow-up    Hypertension Pertinent negatives include no blurred vision, chest pain, headaches, malaise/fatigue, palpitations or shortness of breath.  Hyperlipidemia Pertinent negatives include no chest pain or shortness of breath.  Patient is in today for a office visit.  She complains of burning irritation on the skin on her stomach around the attachment site of her colostomy bag. She notes before her symptoms started it blistered around the site. She typically gets irritated with adhesive and puts a barrier between her skin and that to. She also applies cream and powder to manage it and finds no change. She thinks there is no issues with leaking from her colostomy back.  She continues having vaginal bleeding at this time. She has seen her GYN provider told her there is nothing that can be done at this time. She seen a provider from her surgeons office and found that she had a infection. She had completed a CT scan and found she did not have cancer.  She continues seeing a healthy weight and wellness program. She reports her weight plateaued. She feels burned out from her most recent diet. She has spoken to her provider at the clinic and she is managing her diet at this time.  She is eligible for a pneumonia vaccine and is interested in receiving it during this visit. She recently contracted Covid-19 and cannot receive the new bivalent Covid-19 vaccine at this time.    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+, HER2 negative/  12-11-2015 bilateral mastecotmy w/ reconstruction (no radiation and no chemo)   Cancer of corpus uteri, except isthmus [facility])  oncologist-- dr Denman [name] and dr Alvy Bimler    10-15-2004  dx endometroid endometrial and ovarian cancer s/p  chemotheapy and surgery(TAH w/ BSO) :  recurrent 11-19-2014 post pelvic surgery and radiation 01-29-2015 to 03-10-2015   Chronic idiopathic neutropenia ([place])    presumed related to chemotherapy March 2006--- followed by dr Alvy Bimler (treatment w/ G-CSF injections   Chronic nausea    Chronic pain    perineal/ anal  area from bladder pad irritates skin , right flank pain   CKD stage G2/A3, GFR 60-89 and albumin creatinine ratio >300 mg/g    nephrologist-  dr Madelon Lips   Colovesical fistula    Diabetic retinopathy, background ([place])    Difficult intravenous access    small veins--- hx PICC lines   DM type 2 (diabetes mellitus, type 2) ([place])    monitored by dr Legrand Como altheimer   Dysuria    Environmental and seasonal allergies    Fatty liver 02/08/2016   CT   Generalized muscle weakness    GERD (gastroesophageal reflux disease)    Hiatal hernia    History of abdominal abscess 04/16/2017   post surgery 04-01-2017  --- resolved 10/ 2018  History of gastric polyp    2014  duodenum   History of ileus 04/16/2017   resolved w/ no surgical intervention   History of radiation therapy    01-29-2015 to 03-10-2015  pelvis 50.4Gy   Hypothyroidism    monitored by dr Legrand Como altheimer   IBS (irritable bowel syndrome)    Ileostomy in place [facility]) 04/01/2017   created at same time colostomy takedown.   Joint pain    Leg edema    Lower urinary tract symptoms (LUTS)    urge urinary  incontinence   Mixed dyslipidemia    Multiple thyroid nodules    Managed by Dr. Harlow Asa   Nephrostomy status [facility])    Palpitations     Pelvic abscess in female 04/16/2017   PONV (postoperative nausea and vomiting)    "scopolamine patch works for me"   Radiation-induced dermatitis    contact dermatitis , radiation completed, rash only on ankles now.   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   Vitamin D deficiency    Wears glasses     Past Surgical History:  Procedure Laterality Date   APPENDECTOMY     biopsy thyroid nodules     BREAST RECONSTRUCTION WITH PLACEMENT OF TISSUE EXPANDER AND FLEX HD (ACELLULAR HYDRATED DERMIS) Bilateral 12/11/2015   Procedure: BILATERAL BREAST RECONSTRUCTION WITH PLACEMENT OF TISSUE EXPANDERS;  Surgeon: Irene Limbo, MD;  Location: [place];  Service: Plastics;  Laterality: Bilateral;   COLONOSCOPY WITH PROPOFOL N/A 08/21/2013   Procedure: COLONOSCOPY WITH PROPOFOL;  Surgeon: Cleotis Nipper, MD;  [facility];  Service: Endoscopy;  Laterality: N/A;   COLOSTOMY TAKEDOWN N/A 12/04/2014   Procedure: LAPROSCOPIC LYSIS OF ADHESIONS, SPLENIC MOBILIZATION, RELOCATION OF COLOSTOMY, DEBRIDEMENT INITIAL COLOSTOMY SITE;  Surgeon: Michael [name], MD;  [facility];  Service: General;  Laterality: N/A;   CYSTOGRAM N/A 06/01/2017   Procedure: CYSTOGRAM;  Surgeon: Alexis Frock, MD;  [facility];  Service: Urology;  Laterality: N/A;   CYSTOSCOPY W/ RETROGRADES Right 11/21/2015   Procedure: CYSTOSCOPY WITH RETROGRADE PYELOGRAM;  Surgeon: Alexis Frock, MD;  [facility];  Service: Urology;  Laterality: Right;   CYSTOSCOPY W/ URETERAL STENT PLACEMENT Right 11/21/2015   Procedure: CYSTOSCOPY WITH STENT REPLACEMENT;  Surgeon: Alexis Frock, MD;  [facility];  Service: Urology;  Laterality: Right;   CYSTOSCOPY W/ URETERAL STENT PLACEMENT Right 03/10/2016   Procedure: CYSTOSCOPY WITH STENT REPLACEMENT;  Surgeon: Alexis Frock, MD;   Location: [facility];  Service: Urology;  Laterality: Right;   CYSTOSCOPY W/ URETERAL STENT PLACEMENT Right 06/30/2016   Procedure: CYSTOSCOPY WITH RETROGRADE PYELOGRAM/URETERAL STENT EXCHANGE;  Surgeon: Alexis Frock, MD;  Location: [facility];  Service: Urology;  Laterality: Right;   CYSTOSCOPY W/ URETERAL STENT PLACEMENT N/A 06/01/2017   Procedure: CYSTOSCOPY WITH EXAM UNDER ANESTHESIA;  Surgeon: Alexis Frock, MD;  [facility];  Service: Urology;  Laterality: N/A;   CYSTOSCOPY W/ URETERAL STENT PLACEMENT Right 08/17/2017   Procedure: CYSTOSCOPY WITH RETROGRADE PYELOGRAM/URETERAL STENT REMOVAL;  Surgeon: Alexis Frock, MD;  Location: [name];  Service: Urology;  Laterality: Right;   CYSTOSCOPY WITH RETROGRADE PYELOGRAM, URETEROSCOPY AND STENT PLACEMENT Right 03/20/2015   Procedure: CYSTOSCOPY WITH RETROGRADE PYELOGRAM, URETEROSCOPY WITH BALLOON DILATION AND STENT PLACEMENT ON RIGHT;  Surgeon: Alexis Frock, MD;  [facility]: [facility];  Service: Urology;  Laterality: Right;   CYSTOSCOPY WITH RETROGRADE PYELOGRAM, URETEROSCOPY AND STENT PLACEMENT Right 05/02/2015   Procedure: CYSTOSCOPY WITH RIGHT RETROGRADE PYELOGRAM,  DIAGNOSTIC URETEROSCOPY AND STENT PULL ;  Surgeon: Alexis Frock, MD;  Location: [facility];  Service: Urology;  Laterality: Right;   CYSTOSCOPY WITH RETROGRADE PYELOGRAM, URETEROSCOPY AND STENT PLACEMENT Right 09/05/2015   Procedure: CYSTOSCOPY WITH RETROGRADE PYELOGRAM,  AND STENT PLACEMENT;  Surgeon: Alexis Frock, MD;  [facility];  Service: Urology;  Laterality: Right;   CYSTOSCOPY WITH RETROGRADE PYELOGRAM, URETEROSCOPY AND STENT PLACEMENT Right 04/01/2017   Procedure: CYSTOSCOPY WITH RETROGRADE PYELOGRAM, URETEROSCOPY AND STENT PLACEMENT;  Surgeon: Alexis Frock, MD;  [facility];  Service: Urology;  Laterality: Right;   CYSTOSCOPY WITH STENT PLACEMENT Right 10/27/2016    Procedure: CYSTOSCOPY WITH STENT CHANGE and right retrograde pyelogram;  Surgeon: Alexis Frock, MD;  Location: [facility];  Service: Urology;  Laterality: Right;   EUS N/A 10/02/2014   Procedure: LOWER ENDOSCOPIC ULTRASOUND (EUS);  Surgeon: Arta Silence, MD;  Location: [facility];  Service: Endoscopy;  Laterality: N/A;   EXCISION SOFT TISSUE MASS RIGHT FOREMAN  12-08-2006   EYE SURGERY  as child   pytosis of eyelids repair   INCISION AND DRAINAGE OF WOUND Bilateral 12/26/2015   Procedure: DEBRIDEMENT OF BILATERAL MASTECTOMY FLAPS;  Surgeon: Irene Limbo, MD;  Location: [place];  Service: Plastics;  Laterality: Bilateral;   IR CV LINE INJECTION  05/31/2017   IR FLUORO GUIDE CV LINE LEFT  05/31/2017   IR FLUORO GUIDE CV LINE RIGHT  04/06/2017   IR FLUORO GUIDE CV MIDLINE PICC RIGHT  05/30/2017   IR NEPHROSTOGRAM LEFT INITIAL PLACEMENT  09/02/2017   IR NEPHROSTOGRAM LEFT THRU EXISTING ACCESS  11/29/2017   IR NEPHROSTOGRAM RIGHT INITIAL PLACEMENT  09/02/2017   IR NEPHROSTOGRAM RIGHT THRU EXISTING ACCESS  09/13/2017   IR NEPHROSTOGRAM RIGHT THRU EXISTING ACCESS  11/29/2017   IR NEPHROSTOMY EXCHANGE LEFT  11/28/2017   IR NEPHROSTOMY EXCHANGE LEFT  01/05/2018   IR NEPHROSTOMY EXCHANGE LEFT  02/16/2018   IR NEPHROSTOMY EXCHANGE LEFT  03/30/2018   IR NEPHROSTOMY EXCHANGE LEFT  05/12/2018   IR NEPHROSTOMY EXCHANGE LEFT  06/21/2018   IR NEPHROSTOMY EXCHANGE LEFT  08/04/2018   IR NEPHROSTOMY EXCHANGE LEFT  09/18/2018   IR NEPHROSTOMY EXCHANGE LEFT  10/09/2018   IR NEPHROSTOMY EXCHANGE LEFT  10/27/2018   IR NEPHROSTOMY EXCHANGE LEFT  11/21/2018   IR NEPHROSTOMY EXCHANGE LEFT  01/05/2019   IR NEPHROSTOMY EXCHANGE LEFT  02/15/2019   IR NEPHROSTOMY EXCHANGE LEFT  03/29/2019   IR NEPHROSTOMY EXCHANGE RIGHT  10/02/2017   IR NEPHROSTOMY EXCHANGE RIGHT  11/28/2017   IR NEPHROSTOMY EXCHANGE RIGHT  01/05/2018   IR NEPHROSTOMY EXCHANGE RIGHT  02/16/2018   IR NEPHROSTOMY EXCHANGE RIGHT   03/30/2018   IR NEPHROSTOMY EXCHANGE RIGHT  05/12/2018   IR NEPHROSTOMY EXCHANGE RIGHT  06/21/2018   IR NEPHROSTOMY EXCHANGE RIGHT  08/04/2018   IR NEPHROSTOMY EXCHANGE RIGHT  09/18/2018   IR NEPHROSTOMY EXCHANGE RIGHT  10/27/2018   IR NEPHROSTOMY EXCHANGE RIGHT  11/21/2018   IR NEPHROSTOMY EXCHANGE RIGHT  01/05/2019   IR NEPHROSTOMY EXCHANGE RIGHT  02/15/2019   IR NEPHROSTOMY EXCHANGE RIGHT  03/29/2019   IR NEPHROSTOMY PLACEMENT LEFT  10/02/2017   IR RADIOLOGIST EVAL & MGMT  05/03/2017   IR US GUIDE VASC ACCESS LEFT  05/31/2017   IR US GUIDE VASC ACCESS RIGHT  04/06/2017   IR US GUIDE  VASC ACCESS RIGHT  05/30/2017   LAPAROSCOPIC CHOLECYSTECTOMY  1990   LIPOSUCTION WITH LIPOFILLING Bilateral 04/16/2016   Procedure: LIPOSUCTION WITH LIPOFILLING TO BILATERAL CHEST;  Surgeon: Irene Limbo, MD;  Location: [place];  Service: Plastics;  Laterality: Bilateral;   MASTECTOMY W/ SENTINEL NODE BIOPSY Bilateral 12/11/2015   Procedure: RIGHT PROPHYLACTIC MASTECTOMY, LEFT TOTAL MASTECTOMY WITH LEFT AXILLARY SENTINEL LYMPH NODE BIOPSY;  Surgeon: Stark Klein, MD;  Location: [place];  Service: General;  Laterality: Bilateral;   OSTOMY N/A 11/19/2014   Procedure: OSTOMY;  Surgeon: Michael [name], MD;  [facility];  Service: General;  Laterality: N/A;   PROCTOSCOPY N/A 04/01/2017   Procedure: RIDGE PROCTOSCOPY;  Surgeon: Michael [name], MD;  [facility];  Service: General;  Laterality: N/A;   REMOVAL OF BILATERAL TISSUE EXPANDERS WITH PLACEMENT OF BILATERAL BREAST IMPLANTS Bilateral 04/16/2016   Procedure: REMOVAL OF BILATERAL TISSUE EXPANDERS WITH PLACEMENT OF BILATERAL BREAST IMPLANTS;  Surgeon: Irene Limbo, MD;  Location: [place];  Service: Plastics;  Laterality: Bilateral;   ROBOTIC ASSISTED LAP VAGINAL HYSTERECTOMY N/A 11/19/2014   Procedure: ROBOTIC LYSIS OF ADHESIONS, CONVERTED TO LAPAROTOMY RADICAL UPPER VAGINECTOMY,LOW ANTERIOR BOWEL RESECTION, COLOSTOMY, BILATERAL  URETERAL STENT PLACEMENT AND CYSTONOMY CLOSURE;  Surgeon: Everitt Amber, MD;  [facility];  Service: Gynecology;  Laterality: N/A;   TISSUE EXPANDER FILLING Bilateral 12/26/2015   Procedure: EXPANSION OF BILATERAL CHEST TISSUE EXPANDERS (60 mL- Right; 75 mL- Left);  Surgeon: Irene Limbo, MD;  Location: [place];  Service: Plastics;  Laterality: Bilateral;   TONSILLECTOMY     TOTAL ABDOMINAL HYSTERECTOMY  March 2006   [facility]   and Bilateral Salpingoophorectomy/  staging for Ovarian cancer/  an   XI ROBOTIC ASSISTED LOWER ANTERIOR RESECTION N/A 04/01/2017   Procedure: XI ROBOTIC VS LAPAROSCOPIC COLOSTOMY TAKEDOWN WITH LYSIS OF ADHESIONS.;  Surgeon: Michael [name], MD;  [facility];  Service: General;  Laterality: N/A;  ERAS PATHWAY    Family History  Problem Relation Age of Onset   Cancer Mother 31       stomach ca   Hypertension Mother    Cancer Father 74       prostate ca   Diabetes Father    Heart disease Father        CABG   Hypertension Father    Hyperlipidemia Father    Obesity Father    Breast cancer Maternal Aunt        dx in her 64s   Lymphoma Paternal Aunt    Brain cancer Paternal Grandfather    Ovarian cancer Other    Diabetes Sister    Hypertension Brother y-10   Heart disease Brother        CABG   Diabetes Brother     Social History   Socioeconomic History   Marital status: Married    Spouse name: Armed forces technical officer   Number of children: 1   Years of education: Not on file   Highest education level: Not on file  Occupational History   Occupation: retired Therapist, 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, sports - retired  Tobacco Use   Smoking status: Never   Smokeless tobacco: Never  Vaping Use   Vaping Use: Never used  Substance and Sexual Activity   Alcohol use: Not Currently   Drug use: No   Sexual activity: Not on file  Other Topics Concern   Not on file  Social History Narrative   Exercise-- has not gotten  back into it since cancer came back    Social Determinants of Health   Financial Resource Strain: Not on file  Food Insecurity: Not on file  Transportation Needs: Not on file  Physical Activity: Not on file  Stress: Not on file  Social Connections: Not on file  Intimate Partner Violence: Not on file    Outpatient Medications Prior to Visit  Medication Sig Dispense Refill   acetaminophen (TYLENOL) 325 MG tablet Take 2 tablets (650 mg total) by mouth every 6 (six) hours as needed. 30 tablet 1   albuterol (PROVENTIL HFA) 108 (90 Base) MCG/ACT inhaler Inhale 1 puff into the lungs every 6 (six) hours as needed for wheezing or shortness of breath. 18 g 2   apixaban (ELIQUIS) 5 MG TABS tablet Take 1 tablet (5 mg total) by mouth 2 (two) times daily. 180 tablet 1   benzonatate (TESSALON) 100 MG capsule Take 1 capsule (100 mg total) by mouth 3 (three) times daily as needed for cough. 30 capsule 0   Biotin 5 MG TABS Take 5 mg by mouth daily.      buPROPion (WELLBUTRIN SR) 150 MG 12 hr tablet Take 1 tablet (150 mg total) by mouth 2 (two) times daily. 180 tablet 0   calcium citrate-vitamin D (CITRACAL+D) 315-200 MG-UNIT tablet Take 1 tablet by mouth daily.     Cholecalciferol (VITAMIN D3) 10000 UNITS capsule Take 10,000 Units by mouth See admin instructions. Take 1 tablet by mouth 4 times per week.     clobetasol (OLUX) 0.05 % topical foam Apply topically 2 (two) times daily. 50 g 3   diphenhydrAMINE (BENADRYL) 25 mg capsule Take 1 capsule (25 mg total) by mouth every 8 (eight) hours as needed for itching, allergies or sleep. 30 capsule 0   famotidine-calcium carbonate-magnesium hydroxide (PEPCID COMPLETE) 10-800-165 MG chewable tablet Chew by mouth as needed.     ferrous sulfate 325 (65 FE) MG tablet Take 1 tablet (325 mg total) by mouth at bedtime. 30 tablet 3   fluticasone (FLONASE) 50 MCG/ACT nasal spray SHAKE LIQUID AND USE 2 SPRAYS IN EACH NOSTRIL DAILY 48 g 0   HYDROcodone bit-homatropine (HYCODAN) 5-1.5 MG/5ML syrup Take 5 mLs by  mouth every 6 (six) hours as needed for cough. 120 mL 0   influenza vaccine adjuvanted (FLUAD QUADRIVALENT) 0.5 ML injection Inject into the muscle. 0.5 mL 0   levothyroxine (SYNTHROID, LEVOTHROID) 150 MCG tablet Take 1 tablet (150 mcg total) by mouth daily before breakfast. 30 tablet 1   loratadine (CLARITIN) 10 MG tablet Take 10 mg by mouth daily.      Melatonin 10 MG TABS Take 1 tablet by mouth at bedtime as needed.     mupirocin ointment (BACTROBAN) 2 % Place 1 application into the nose 2 (two) times daily. 22 g 0   omega-3 acid ethyl esters (LOVAZA) 1 G capsule Take 1 g by mouth 2 (two) times daily.      Polyethyl Glycol-Propyl Glycol (SYSTANE OP) Place 1 drop into both eyes daily as needed (dry eyes).      Prenatal Vit-Fe Fumarate-FA (PRENATAL VITAMIN PO) Take 1 capsule by mouth daily. Takes prenatal because there are no dyes in it     Probiotic Product (ALIGN PO) Take by mouth 2 (two) times daily.     rifaximin (XIFAXAN) 550 MG TABS tablet Take 550 mg by mouth See admin instructions. Take one tablet by mouth three times daily for 14 days when needed.     rosuvastatin (  CRESTOR) 10 MG tablet Take 10 mg by mouth every evening.      Saccharomyces boulardii (FLORASTOR PO) Take 1 capsule by mouth daily.     Semaglutide, 1 MG/DOSE, 4 MG/3ML SOPN Inject 1 mg as directed once a week. 9 mL 0   Tazarotene (ARAZLO) 0.045 % LOTN Apply 1 application topically daily. 45 g 0   Tbo-Filgrastim (GRANIX) 480 MCG/0.8ML SOSY injection 480 mcg subcutaneous injection every 6 days life long 11.2 mL 11   tiZANidine (ZANAFLEX) 4 MG tablet Take 0.5-1 tablets (2-4 mg total) by mouth every 8 (eight) hours as needed for muscle spasms. 60 tablet 1   pregabalin (LYRICA) 50 MG capsule TAKE 1 CAPSULE TWICE A DAY 60 capsule 5   No facility-administered medications prior to visit.    Allergies  Allergen Reactions   Penicillins Swelling    Facial swelling/childhood allergy Has patient had a PCN reaction causing immediate  rash, facial/tongue/throat swelling, SOB or lightheadedness with hypotension: Yes Has patient had a PCN reaction causing severe rash involving mucus membranes or skin necrosis: Yes Has patient had a PCN reaction that required hospitalization yes Has patient had a PCN reaction occurring within the last 10 years: No If all of the above answers are "NO", then may proceed with Cephalosporin use.    Cefaclor Rash    Ceclor   Erythromycin Other (See Comments)    Gastritis, abd cramps   Tape Rash    blisters   Trimethoprim Rash   Ultram [Tramadol] Hives   Zarxio [Filgrastim] Other (See Comments)    Post injection, elevated heart rate, body feeling unwell, uneasy.    Cephalosporins Rash   Fluconazole Rash   Neomycin Rash    blisters   Oxycodone Other (See Comments)    " I just feel weird"   Pectin Rash    Pectin ring for stoma   Septra [Sulfamethoxazole-Trimethoprim] Rash   Sulfa Antibiotics Rash    Review of Systems  Constitutional:  Negative for fever and malaise/fatigue.  HENT:  Negative for congestion.   Eyes:  Negative for blurred vision.  Respiratory:  Negative for shortness of breath.   Cardiovascular:  Negative for chest pain, palpitations and leg swelling.  Gastrointestinal:  Negative for abdominal pain, blood in stool and nausea.  Genitourinary:  Negative for dysuria and frequency.       (+)vaginal bleeding  Musculoskeletal:  Negative for falls.  Skin:  Negative for rash.       (+)burning on stomach around attachment of colostomy bag   Neurological:  Negative for dizziness, loss of consciousness and headaches.  Endo/Heme/Allergies:  Negative for environmental allergies.  Psychiatric/Behavioral:  Negative for depression. The patient is not nervous/anxious.       Objective:    Physical Exam Vitals and nursing note reviewed.  Constitutional:      General: She is not in acute distress.    Appearance: Normal appearance. She is not ill-appearing.  HENT:     Head:  Normocephalic and atraumatic.     Right Ear: External ear normal.     Left Ear: External ear normal.  Eyes:     Extraocular Movements: Extraocular movements intact.     Pupils: Pupils are equal, round, and reactive to light.  Cardiovascular:     Rate and Rhythm: Normal rate and regular rhythm.     Heart sounds: Normal heart sounds. No murmur heard.   No gallop.  Pulmonary:     Effort: Pulmonary effort is normal. No respiratory distress.  Breath sounds: Normal breath sounds. No wheezing or rales.  Skin:    General: Skin is warm and dry.  Neurological:     Mental Status: She is alert and oriented to person, place, and time.  Psychiatric:        Behavior: Behavior normal.        Judgment: Judgment normal.    BP 126/60 (BP Location: Left Arm, Patient Position: Sitting, Cuff Size: Large)    Pulse 81    Temp 98.2 F (36.8 C) (Oral)    Resp 20    Ht 5' 2"  (1.575 m)    Wt 218 lb 3.2 oz (99 kg)    SpO2 97%    BMI 39.91 kg/m  Wt Readings from Last 3 Encounters:  07/23/21 218 lb 3.2 oz (99 kg)  07/10/21 216 lb 3.2 oz (98.1 kg)  07/02/21 215 lb (97.5 kg)    Diabetic Foot Exam - Simple   No data filed    Lab Results  Component Value Date   WBC 4.7 07/20/2021   HGB 11.8 07/20/2021   HCT 36.9 07/20/2021   PLT 242 07/20/2021   GLUCOSE 60 (L) 07/20/2021   CHOL 92 (L) 06/16/2020   TRIG 128 06/16/2020   HDL 49 06/16/2020   LDLCALC 21 06/16/2020   ALT 33 (H) 06/16/2020   AST 25 06/16/2020   NA 140 07/20/2021   K 4.3 07/20/2021   CL 105 07/20/2021   CREATININE 1.78 (H) 07/20/2021   BUN 30 (H) 07/20/2021   CO2 22 07/20/2021   TSH 3.38 01/18/2020   INR 1.0 03/29/2019   HGBA1C 6.3 (H) 06/16/2020   MICROALBUR 37.1 (H) 01/20/2021    Lab Results  Component Value Date   TSH 3.38 01/18/2020   Lab Results  Component Value Date   WBC 4.7 07/20/2021   HGB 11.8 07/20/2021   HCT 36.9 07/20/2021   MCV 95 07/20/2021   PLT 242 07/20/2021   Lab Results  Component Value Date    NA 140 07/20/2021   K 4.3 07/20/2021   CHLORIDE 104 01/24/2017   CO2 22 07/20/2021   GLUCOSE 60 (L) 07/20/2021   BUN 30 (H) 07/20/2021   CREATININE 1.78 (H) 07/20/2021   BILITOT 0.3 06/16/2020   ALKPHOS 175 (H) 06/16/2020   AST 25 06/16/2020   ALT 33 (H) 06/16/2020   PROT 7.8 06/16/2020   ALBUMIN 4.4 06/16/2020   CALCIUM 9.2 07/20/2021   ANIONGAP 9 10/02/2019   EGFR 31 (L) 07/20/2021   GFR 59.80 (L) 02/26/2016   Lab Results  Component Value Date   CHOL 92 (L) 06/16/2020   Lab Results  Component Value Date   HDL 49 06/16/2020   Lab Results  Component Value Date   LDLCALC 21 06/16/2020   Lab Results  Component Value Date   TRIG 128 06/16/2020   Lab Results  Component Value Date   CHOLHDL 1.9 01/18/2020   Lab Results  Component Value Date   HGBA1C 6.3 (H) 06/16/2020       Assessment & Plan:   Problem List Items Addressed This Visit       Unprioritized   Chronic pain   Relevant Medications   pregabalin (LYRICA) 50 MG capsule   Other Relevant Orders   Comprehensive metabolic panel   Lipid panel   HLD (hyperlipidemia) - Primary   Relevant Orders   CBC with Differential/Platelet   Comprehensive metabolic panel   Lipid panel   Benign essential HTN    Well  controlled, no changes to meds. Encouraged heart healthy diet such as the DASH diet and exercise as tolerated.        Class 2 severe obesity with serious comorbidity and body mass index (BMI) of 38.0 to 38.9 in adult (HCC)    Pt losing weight with healthy weight and wellness      DM type 2 (diabetes mellitus, type 2) ([place])    hgba1c to be checked , minimize simple carbs. Increase exercise as tolerated. 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    Dispense:  60 capsule    Refill:  5    I, Dr. [name], DO, personally preformed the services described in this documentation.  All medical record entries made by the scribe were at my direction and in my presence.  I have reviewed the chart and discharge instructions (if applicable) and agree that the record reflects my personal performance and is accurate and complete. 07/23/2021   I,Shehryar Baig,acting as a scribe for Ann Held, DO.,have documented all relevant documentation on the behalf of Ann Held, DO,as directed by  Ann Held, DO while in the presence of Ann Held, DO.   Ann Held, DO

## 2021-07-23 NOTE — Assessment & Plan Note (Signed)
hgba1c to be checked, minimize simple carbs. Increase exercise as tolerated. Continue current meds  

## 2021-07-23 NOTE — Assessment & Plan Note (Signed)
Well controlled, no changes to meds. Encouraged heart healthy diet such as the DASH diet and exercise as tolerated.  °

## 2021-07-23 NOTE — Assessment & Plan Note (Signed)
Per gyn onc

## 2021-07-23 NOTE — Patient Instructions (Signed)
DASH Eating Plan DASH stands for Dietary Approaches to Stop Hypertension. The DASH eating plan is a healthy eating plan that has been shown to: Reduce high blood pressure (hypertension). Reduce your risk for type 2 diabetes, heart disease, and stroke. Help with weight loss. What are tips for following this plan? Reading food labels Check food labels for the amount of salt (sodium) per serving. Choose foods with less than 5 percent of the Daily Value of sodium. Generally, foods with less than 300 milligrams (mg) of sodium per serving fit into this eating plan. To find whole grains, look for the word "whole" as the first word in the ingredient list. Shopping Buy products labeled as "low-sodium" or "no salt added." Buy fresh foods. Avoid canned foods and pre-made or frozen meals. Cooking Avoid adding salt when cooking. Use salt-free seasonings or herbs instead of table salt or sea salt. Check with your health care provider or pharmacist before using salt substitutes. Do not fry foods. Cook foods using healthy methods such as baking, boiling, grilling, roasting, and broiling instead. Cook with heart-healthy oils, such as olive, canola, avocado, soybean, or sunflower oil. Meal planning  Eat a balanced diet that includes: 4 or more servings of fruits and 4 or more servings of vegetables each day. Try to fill one-half of your plate with fruits and vegetables. 6-8 servings of whole grains each day. Less than 6 oz (170 g) of lean meat, poultry, or fish each day. A 3-oz (85-g) serving of meat is about the same size as a deck of cards. One egg equals 1 oz (28 g). 2-3 servings of low-fat dairy each day. One serving is 1 cup (237 mL). 1 serving of nuts, seeds, or beans 5 times each week. 2-3 servings of heart-healthy fats. Healthy fats called omega-3 fatty acids are found in foods such as walnuts, flaxseeds, fortified milks, and eggs. These fats are also found in cold-water fish, such as sardines, salmon,  and mackerel. Limit how much you eat of: Canned or prepackaged foods. Food that is high in trans fat, such as some fried foods. Food that is high in saturated fat, such as fatty meat. Desserts and other sweets, sugary drinks, and other foods with added sugar. Full-fat dairy products. Do not salt foods before eating. Do not eat more than 4 egg yolks a week. Try to eat at least 2 vegetarian meals a week. Eat more home-cooked food and less restaurant, buffet, and fast food. Lifestyle When eating at a restaurant, ask that your food be prepared with less salt or no salt, if possible. If you drink alcohol: Limit how much you use to: 0-1 drink a day for women who are not pregnant. 0-2 drinks a day for men. Be aware of how much alcohol is in your drink. In the [place] one drink equals one 12 oz bottle of beer (355 mL), one 5 oz glass of wine (148 mL), or one 1 oz glass of hard liquor (44 mL). General information Avoid eating more than 2,300 mg of salt a day. If you have hypertension, you may need to reduce your sodium intake to 1,500 mg a day. Work with your health care provider to maintain a healthy body weight or to lose weight. Ask what an ideal weight is for you. Get at least 30 minutes of exercise that causes your heart to beat faster (aerobic exercise) most days of the week. Activities may include walking, swimming, or biking. Work with your health care provider or dietitian to   adjust your eating plan to your individual calorie needs. What foods should I eat? Fruits All fresh, dried, or frozen fruit. Canned fruit in natural juice (without added sugar). Vegetables Fresh or frozen vegetables (raw, steamed, roasted, or grilled). Low-sodium or reduced-sodium tomato and vegetable juice. Low-sodium or reduced-sodium tomato sauce and tomato paste. Low-sodium or reduced-sodium canned vegetables. Grains Whole-grain or whole-wheat bread. Whole-grain or whole-wheat pasta. Brown rice. Oatmeal. Quinoa.  Bulgur. Whole-grain and low-sodium cereals. Pita bread. Low-fat, low-sodium crackers. Whole-wheat flour tortillas. Meats and other proteins Skinless chicken or turkey. Ground chicken or turkey. Pork with fat trimmed off. Fish and seafood. Egg whites. Dried beans, peas, or lentils. Unsalted nuts, nut butters, and seeds. Unsalted canned beans. Lean cuts of beef with fat trimmed off. Low-sodium, lean precooked or cured meat, such as sausages or meat loaves. Dairy Low-fat (1%) or fat-free (skim) milk. Reduced-fat, low-fat, or fat-free cheeses. Nonfat, low-sodium ricotta or cottage cheese. Low-fat or nonfat yogurt. Low-fat, low-sodium cheese. Fats and oils Soft margarine without trans fats. Vegetable oil. Reduced-fat, low-fat, or light mayonnaise and salad dressings (reduced-sodium). Canola, safflower, olive, avocado, soybean, and sunflower oils. Avocado. Seasonings and condiments Herbs. Spices. Seasoning mixes without salt. Other foods Unsalted popcorn and pretzels. Fat-free sweets. The items listed above may not be a complete list of foods and beverages you can eat. Contact a dietitian for more information. What foods should I avoid? Fruits Canned fruit in a light or heavy syrup. Fried fruit. Fruit in cream or butter sauce. Vegetables Creamed or fried vegetables. Vegetables in a cheese sauce. Regular canned vegetables (not low-sodium or reduced-sodium). Regular canned tomato sauce and paste (not low-sodium or reduced-sodium). Regular tomato and vegetable juice (not low-sodium or reduced-sodium). Pickles. Olives. Grains Baked goods made with fat, such as croissants, muffins, or some breads. Dry pasta or rice meal packs. Meats and other proteins Fatty cuts of meat. Ribs. Fried meat. Bacon. Bologna, salami, and other precooked or cured meats, such as sausages or meat loaves. Fat from the back of a pig (fatback). Bratwurst. Salted nuts and seeds. Canned beans with added salt. Canned or smoked fish.  Whole eggs or egg yolks. Chicken or turkey with skin. Dairy Whole or 2% milk, cream, and half-and-half. Whole or full-fat cream cheese. Whole-fat or sweetened yogurt. Full-fat cheese. Nondairy creamers. Whipped toppings. Processed cheese and cheese spreads. Fats and oils Butter. Stick margarine. Lard. Shortening. Ghee. Bacon fat. Tropical oils, such as coconut, palm kernel, or palm oil. Seasonings and condiments Onion salt, garlic salt, seasoned salt, table salt, and sea salt. Worcestershire sauce. Tartar sauce. Barbecue sauce. Teriyaki sauce. Soy sauce, including reduced-sodium. Steak sauce. Canned and packaged gravies. Fish sauce. Oyster sauce. Cocktail sauce. Store-bought horseradish. Ketchup. Mustard. Meat flavorings and tenderizers. Bouillon cubes. Hot sauces. Pre-made or packaged marinades. Pre-made or packaged taco seasonings. Relishes. Regular salad dressings. Other foods Salted popcorn and pretzels. The items listed above may not be a complete list of foods and beverages you should avoid. Contact a dietitian for more information. Where to find more information National Heart, Lung, and Blood Institute: www.nhlbi.nih.gov American Heart Association: www.heart.org Academy of Nutrition and Dietetics: www.eatright.org National Kidney Foundation: www.kidney.org Summary The DASH eating plan is a healthy eating plan that has been shown to reduce high blood pressure (hypertension). It may also reduce your risk for type 2 diabetes, heart disease, and stroke. When on the DASH eating plan, aim to eat more fresh fruits and vegetables, whole grains, lean proteins, low-fat dairy, and heart-healthy fats. With the DASH   eating plan, you should limit salt (sodium) intake to 2,300 mg a day. If you have hypertension, you may need to reduce your sodium intake to 1,500 mg a day. Work with your health care provider or dietitian to adjust your eating plan to your individual calorie needs. This information is not  intended to replace advice given to you by your health care provider. Make sure you discuss any questions you have with your health care provider. Document Revised: 06/22/2019 Document Reviewed: 06/22/2019 Elsevier Patient Education  2022 Elsevier Inc.  

## 2021-07-23 NOTE — Assessment & Plan Note (Signed)
Check labs Stable  con't synthroid

## 2021-07-23 NOTE — Assessment & Plan Note (Signed)
Pt losing weight with healthy weight and wellness

## 2021-07-24 LAB — CBC WITH DIFFERENTIAL/PLATELET
Basophils Absolute: 0.1 10*3/uL (ref 0.0–0.1)
Basophils Relative: 0.9 % (ref 0.0–3.0)
Eosinophils Absolute: 0 10*3/uL (ref 0.0–0.7)
Eosinophils Relative: 0.6 % (ref 0.0–5.0)
HCT: 35.1 % — ABNORMAL LOW (ref 36.0–46.0)
Hemoglobin: 11.3 g/dL — ABNORMAL LOW (ref 12.0–15.0)
Lymphocytes Relative: 8.1 % — ABNORMAL LOW (ref 12.0–46.0)
Lymphs Abs: 0.5 10*3/uL — ABNORMAL LOW (ref 0.7–4.0)
MCHC: 32.3 g/dL (ref 30.0–36.0)
MCV: 94.2 fl (ref 78.0–100.0)
Monocytes Absolute: 0.7 10*3/uL (ref 0.1–1.0)
Monocytes Relative: 11.2 % (ref 3.0–12.0)
Neutro Abs: 5 10*3/uL (ref 1.4–7.7)
Neutrophils Relative %: 79.2 % — ABNORMAL HIGH (ref 43.0–77.0)
Platelets: 192 10*3/uL (ref 150.0–400.0)
RBC: 3.72 Mil/uL — ABNORMAL LOW (ref 3.87–5.11)
RDW: 15.4 % (ref 11.5–15.5)
WBC: 6.4 10*3/uL (ref 4.0–10.5)

## 2021-07-24 LAB — COMPREHENSIVE METABOLIC PANEL
ALT: 20 U/L (ref 0–35)
AST: 17 U/L (ref 0–37)
Albumin: 3.8 g/dL (ref 3.5–5.2)
Alkaline Phosphatase: 112 U/L (ref 39–117)
BUN: 27 mg/dL — ABNORMAL HIGH (ref 6–23)
CO2: 25 mEq/L (ref 19–32)
Calcium: 9 mg/dL (ref 8.4–10.5)
Chloride: 106 mEq/L (ref 96–112)
Creatinine, Ser: 1.74 mg/dL — ABNORMAL HIGH (ref 0.40–1.20)
GFR: 30.11 mL/min — ABNORMAL LOW (ref >60.00)
Glucose, Bld: 116 mg/dL — ABNORMAL HIGH (ref 70–99)
Potassium: 4.1 mEq/L (ref 3.5–5.1)
Sodium: 140 mEq/L (ref 135–145)
Total Bilirubin: 0.4 mg/dL (ref 0.2–1.2)
Total Protein: 7 g/dL (ref 6.0–8.3)

## 2021-07-24 LAB — LIPID PANEL
Cholesterol: 73 mg/dL (ref 0–200)
HDL: 43.5 mg/dL (ref >39.00)
LDL Cholesterol: 12 mg/dL (ref 0–99)
NonHDL: 29.33
Total CHOL/HDL Ratio: 2
Triglycerides: 89 mg/dL (ref 0.0–149.0)
VLDL: 17.8 mg/dL (ref 0.0–40.0)

## 2021-07-24 LAB — HEMOGLOBIN A1C: Hgb A1c MFr Bld: 6 % (ref 4.6–6.5)

## 2021-07-24 LAB — TSH: TSH: 1.83 u[IU]/mL (ref 0.35–5.50)

## 2021-07-30 DIAGNOSIS — E1169 Type 2 diabetes mellitus with other specified complication: Secondary | ICD-10-CM | POA: Diagnosis not present

## 2021-07-30 DIAGNOSIS — E1122 Type 2 diabetes mellitus with diabetic chronic kidney disease: Secondary | ICD-10-CM | POA: Diagnosis not present

## 2021-07-30 DIAGNOSIS — E785 Hyperlipidemia, unspecified: Secondary | ICD-10-CM | POA: Diagnosis not present

## 2021-07-30 DIAGNOSIS — N1832 Chronic kidney disease, stage 3b: Secondary | ICD-10-CM | POA: Diagnosis not present

## 2021-07-30 DIAGNOSIS — I129 Hypertensive chronic kidney disease with stage 1 through stage 4 chronic kidney disease, or unspecified chronic kidney disease: Secondary | ICD-10-CM | POA: Diagnosis not present

## 2021-07-30 DIAGNOSIS — E039 Hypothyroidism, unspecified: Secondary | ICD-10-CM | POA: Diagnosis not present

## 2021-08-04 DIAGNOSIS — E1122 Type 2 diabetes mellitus with diabetic chronic kidney disease: Secondary | ICD-10-CM | POA: Diagnosis not present

## 2021-08-04 DIAGNOSIS — E1142 Type 2 diabetes mellitus with diabetic polyneuropathy: Secondary | ICD-10-CM | POA: Diagnosis not present

## 2021-08-04 DIAGNOSIS — I129 Hypertensive chronic kidney disease with stage 1 through stage 4 chronic kidney disease, or unspecified chronic kidney disease: Secondary | ICD-10-CM | POA: Diagnosis not present

## 2021-08-04 DIAGNOSIS — N1832 Chronic kidney disease, stage 3b: Secondary | ICD-10-CM | POA: Diagnosis not present

## 2021-08-08 ENCOUNTER — Encounter: Payer: Self-pay | Admitting: Dermatology

## 2021-08-08 NOTE — Progress Notes (Signed)
° °  Follow-Up Visit   Subjective  [name] is a 66 y.o. female who presents for the following: Actinic Keratosis (Pt here for f/o on AK x2 on left forehead, last tx 04/01/2021. Pt states she thinks she still feels something there and wants to have it evaluated. /Pt also has a sun spot on the Right cheek she would like to discuss tx for ).  Follow-up on forehead crust and several other spots to check, some facial breaking out Location:  Duration:  Quality:  Associated Signs/Symptoms: Modifying Factors:  Severity:  Timing: Context:   Objective  Well appearing patient in no apparent distress; mood and affect are within normal limits. Left Forehead Area now smooth to touch  Head - Anterior (Face) 5 mm monochrome light brown macule, no dermoscopy atypia  Head - Anterior (Face) Mild adult acne with small inflammatory papules mostly lower half of face    Head and neck examination.   Assessment & Plan    AK (actinic keratosis) Left Forehead  Recheck as needed clinical change  Solar lentigo Head - Anterior (Face)  Leave if stable  Acne vulgaris Head - Anterior (Face)  Topical Arazlo 2-3 nights weekly  Tazarotene (ARAZLO) 0.045 % LOTN - Head - Anterior (Face) Apply 1 application topically daily.      I, Lavonna Monarch, MD, have reviewed all documentation for this visit.  The documentation on 08/08/21 for the exam, diagnosis, procedures, and orders are all accurate and complete.

## 2021-08-10 ENCOUNTER — Other Ambulatory Visit: Payer: Self-pay

## 2021-08-10 ENCOUNTER — Encounter (INDEPENDENT_AMBULATORY_CARE_PROVIDER_SITE_OTHER): Payer: Self-pay | Admitting: Family Medicine

## 2021-08-10 ENCOUNTER — Ambulatory Visit (INDEPENDENT_AMBULATORY_CARE_PROVIDER_SITE_OTHER): Payer: BC Managed Care – PPO | Admitting: Family Medicine

## 2021-08-10 VITALS — BP 108/70 | HR 80 | Ht 62.0 in | Wt 216.0 lb

## 2021-08-10 DIAGNOSIS — N184 Chronic kidney disease, stage 4 (severe): Secondary | ICD-10-CM | POA: Diagnosis not present

## 2021-08-10 DIAGNOSIS — E1122 Type 2 diabetes mellitus with diabetic chronic kidney disease: Secondary | ICD-10-CM

## 2021-08-10 DIAGNOSIS — Z6839 Body mass index (BMI) 39.0-39.9, adult: Secondary | ICD-10-CM

## 2021-08-11 NOTE — Progress Notes (Signed)
Chief Complaint:   OBESITY Shamecca is here to discuss her progress with her obesity treatment plan along with follow-up of her obesity related diagnoses. Kasarah is on keeping a food journal and adhering to recommended goals of 1200-1300 calories and 75+ grams of protein daily and states she is following her eating plan approximately 50% of the time. Maija states she has increased her walking during the holiday.  Today's visit was #: 89 Starting weight: 208 lbs Starting date: 04/05/2018 Today's weight: 214 lbs Today's date: 08/10/2021 Total lbs lost to date: 0 Total lbs lost since last in-office visit: 1  Interim History: Iyanla had a lot of temptations over the holidays. She did very well with avoiding holiday weight gain, and she is ready to get back on track.  Subjective:   1. Type 2 diabetes mellitus with stage 4 chronic kidney disease, without long-term current use of insulin (HCC) Yalitza is on Ozempic and she is working on diet and exercise. She notes some increased GERD, but she feels that was related to her Wellbutrin and decreased her dose to once daily.  Assessment/Plan:   1. Type 2 diabetes mellitus with stage 4 chronic kidney disease, without long-term current use of insulin ([place]) Reeshemah will continue Ozempic 1 mg weekly, and we will follow up at her next visit. Good blood sugar control is important to decrease the likelihood of diabetic complications such as nephropathy, neuropathy, limb loss, blindness, coronary artery disease, and death. Intensive lifestyle modification including diet, exercise and weight loss are the first line of treatment for diabetes.   2. Obesity with current BMI of 39.1 Alveta is currently in the action stage of change. As such, her goal is to continue with weight loss efforts. She has agreed to keeping a food journal and adhering to recommended goals of 1200-1300 calories and 75+ grams of protein daily.   Exercise goals: As is.  Behavioral modification  strategies: keeping a strict food journal.  Aeisha has agreed to follow-up with our clinic in 3 to 4 weeks. She was informed of the importance of frequent follow-up visits to maximize her success with intensive lifestyle modifications for her multiple health conditions.   Objective:   Blood pressure 108/70, pulse 80, height 5\' 2"  (1.575 m), weight 216 lb (98 kg), SpO2 99 %. Body mass index is 39.51 kg/m.  General: Cooperative, alert, well developed, in no acute distress. HEENT: Conjunctivae and lids unremarkable. Cardiovascular: Regular rhythm.  Lungs: Normal work of breathing. Neurologic: No focal deficits.   Lab Results  Component Value Date   CREATININE 1.74 (H) 07/23/2021   BUN 27 (H) 07/23/2021   NA 140 07/23/2021   K 4.1 07/23/2021   CL 106 07/23/2021   CO2 25 07/23/2021   Lab Results  Component Value Date   ALT 20 07/23/2021   AST 17 07/23/2021   ALKPHOS 112 07/23/2021   BILITOT 0.4 07/23/2021   Lab Results  Component Value Date   HGBA1C 6.0 07/23/2021   HGBA1C 6.3 (H) 06/16/2020   HGBA1C 7.9 (H) 09/01/2017   HGBA1C 5.3 04/01/2017   HGBA1C 5.8 (H) 09/05/2015   Lab Results  Component Value Date   INSULIN 14.1 06/16/2020   Lab Results  Component Value Date   TSH 1.83 07/23/2021   Lab Results  Component Value Date   CHOL 73 07/23/2021   HDL 43.50 07/23/2021   LDLCALC 12 07/23/2021   TRIG 89.0 07/23/2021   CHOLHDL 2 07/23/2021   Lab Results  Component Value Date   VD25OH 25.9 (L) 06/16/2020   VD25OH 40.8 04/05/2018   Lab Results  Component Value Date   WBC 6.4 07/23/2021   HGB 11.3 (L) 07/23/2021   HCT 35.1 (L) 07/23/2021   MCV 94.2 07/23/2021   PLT 192.0 07/23/2021   Lab Results  Component Value Date   IRON 7 (L) 08/31/2017   TIBC 164 (L) 08/31/2017   FERRITIN 27 11/29/2014   Attestation Statements:   Reviewed by clinician on day of visit: allergies, medications, problem list, medical history, surgical history, family history, social  history, and previous encounter notes.  Time spent on visit including pre-visit chart review and post-visit care and charting was 33 minutes.    I, Trixie Dredge, am acting as transcriptionist for Dennard Nip, MD.  I have reviewed the above documentation for accuracy and completeness, and I agree with the above. -  Dennard Nip, MD

## 2021-08-12 IMAGING — US US RENAL
1 series · 14 of 25 positions shown · non-contrast
Comparison: 01/21/2019 and prior.

CLINICAL DATA: Other hydronephrosis

EXAM:
RENAL / URINARY TRACT ULTRASOUND COMPLETE

[Series 1: us renal · 0.23mm/px · 14 of 35 slices shown]
[im 1/35]
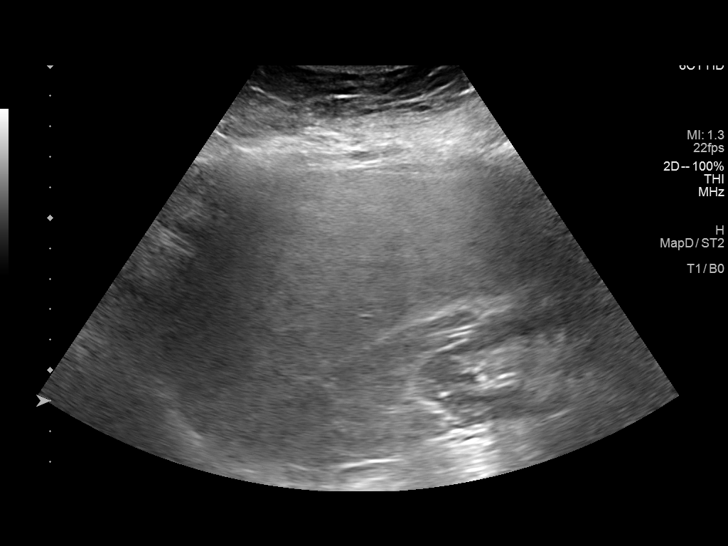
[im 3/35]
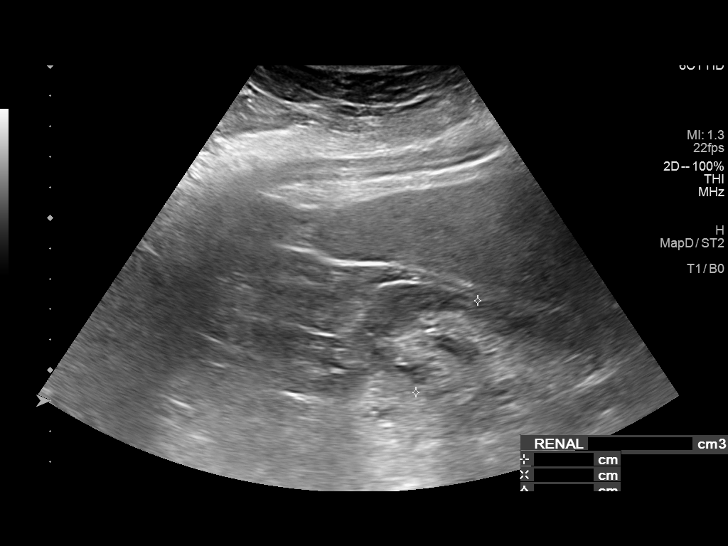
[im 6/35]
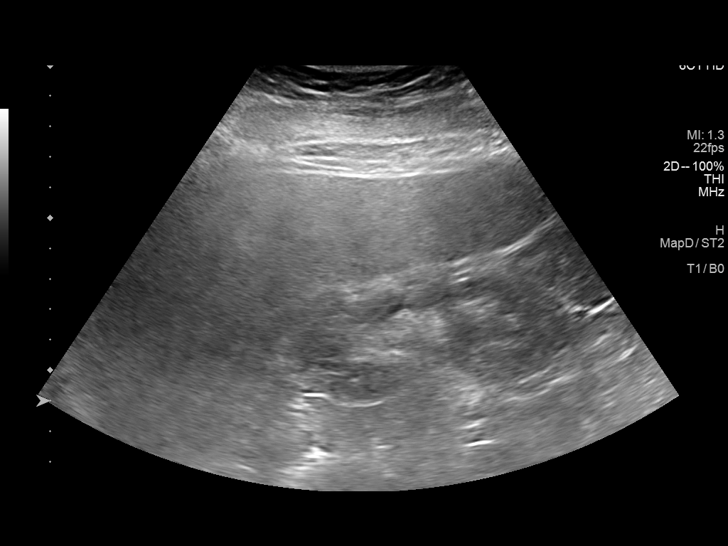
[im 9/35]
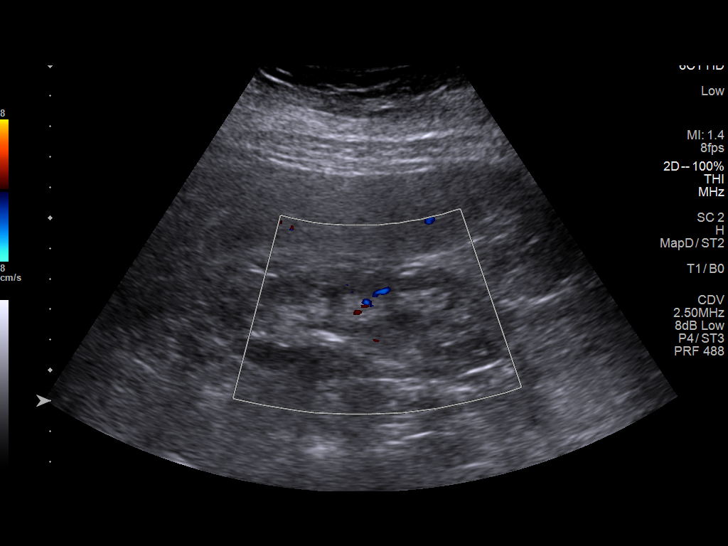
[im 12/35]
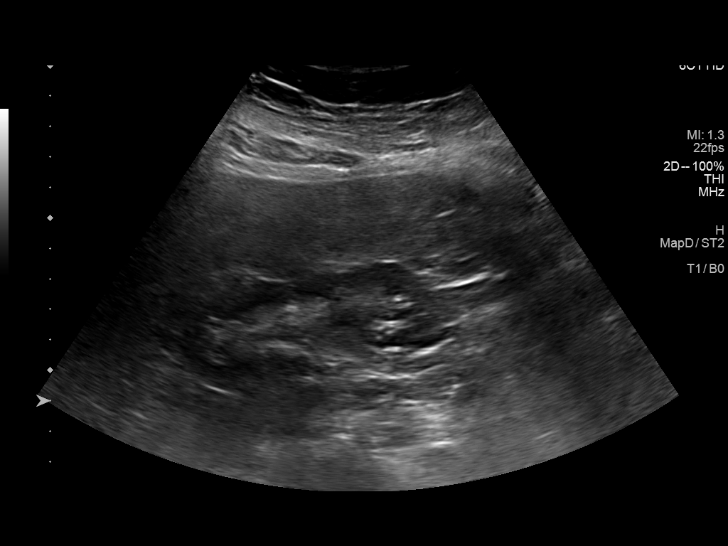
[im 13/35]
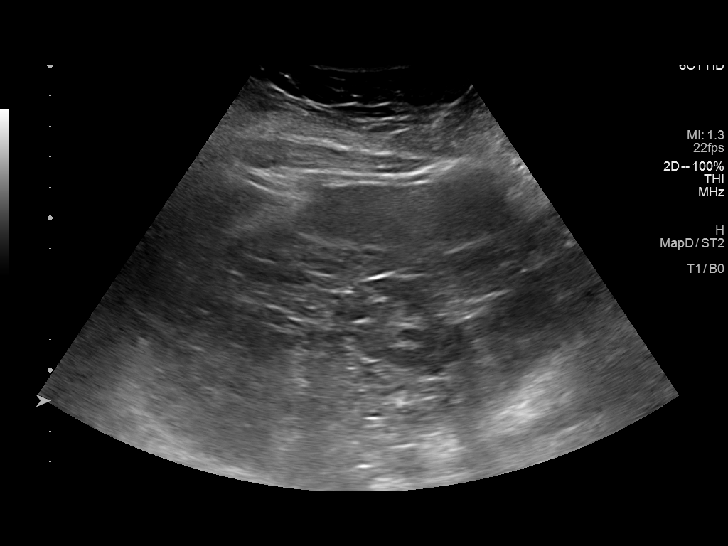
[im 16/35]
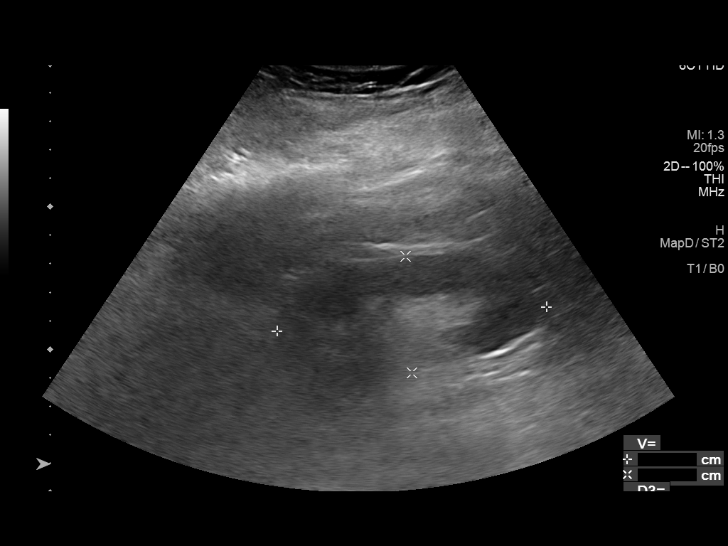
[im 19/35]
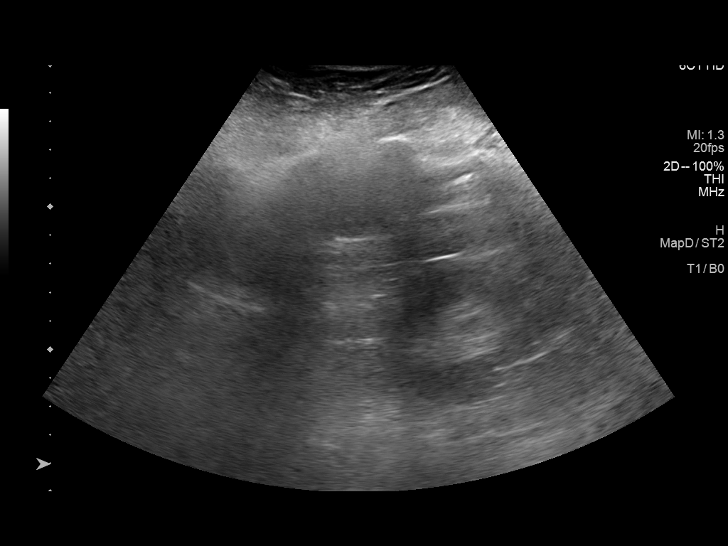
[im 22/35]
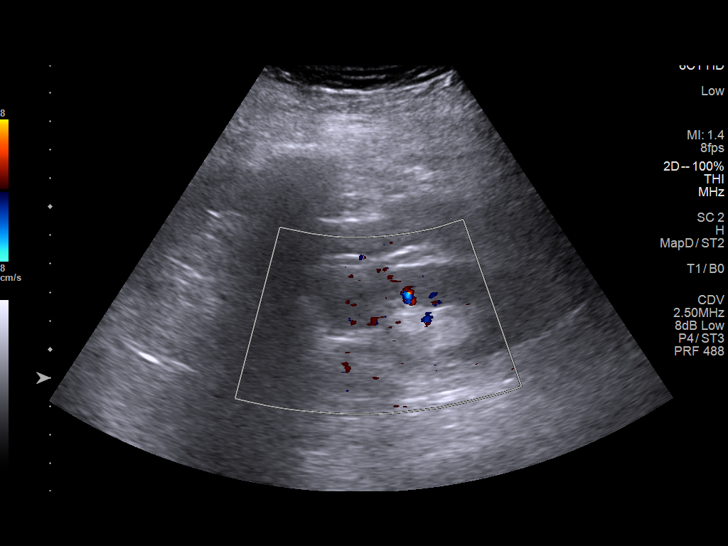
[im 23/35]
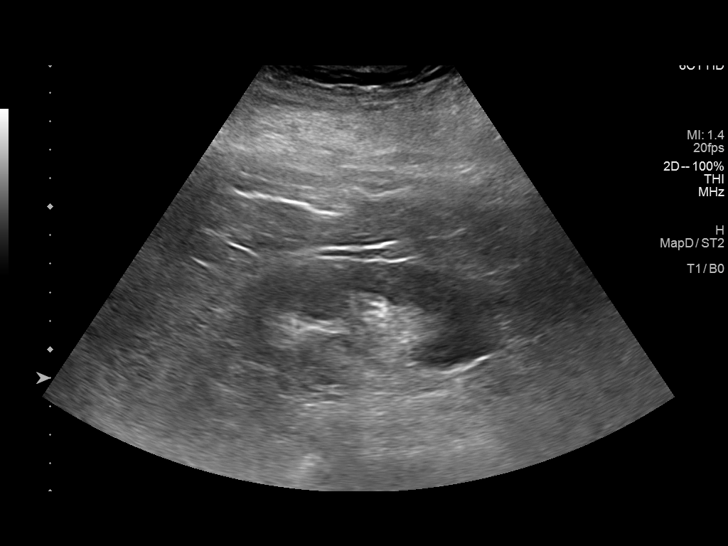
[im 26/35]
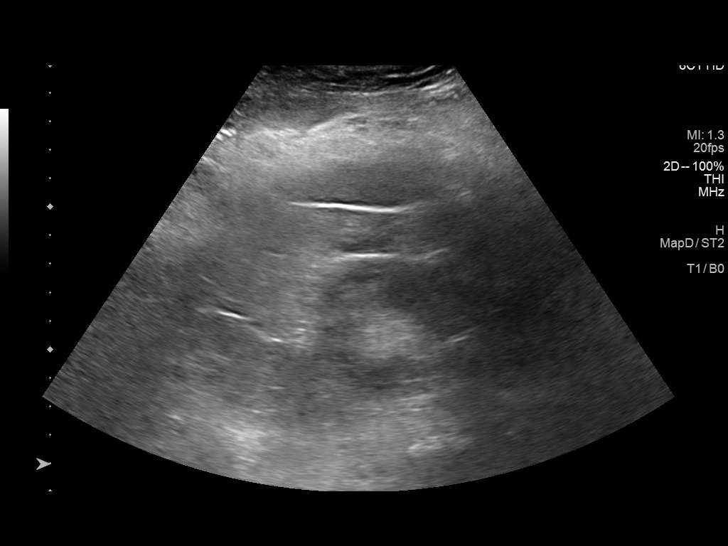
[im 29/35]
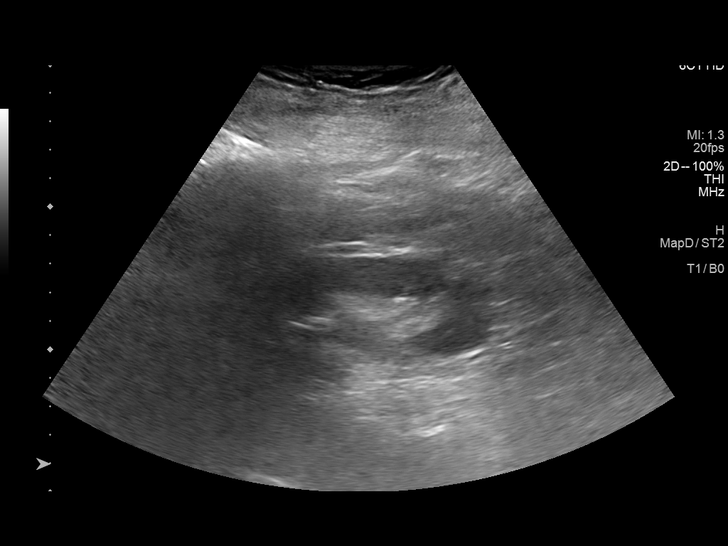
[im 32/35]
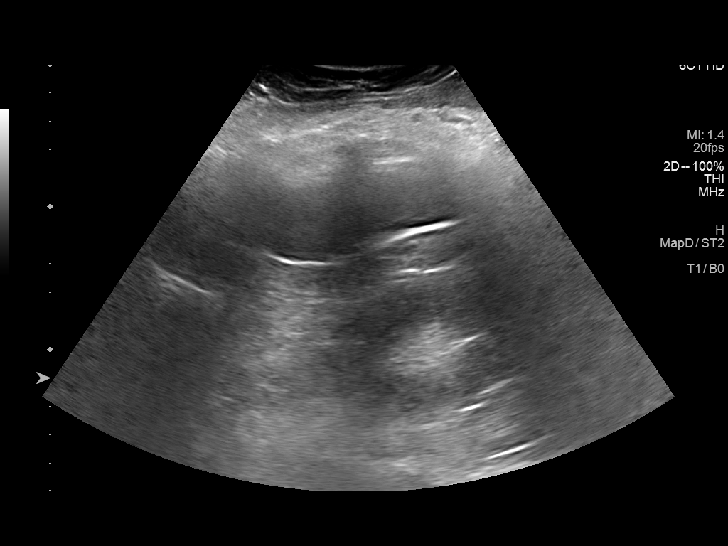
[im 35/35]
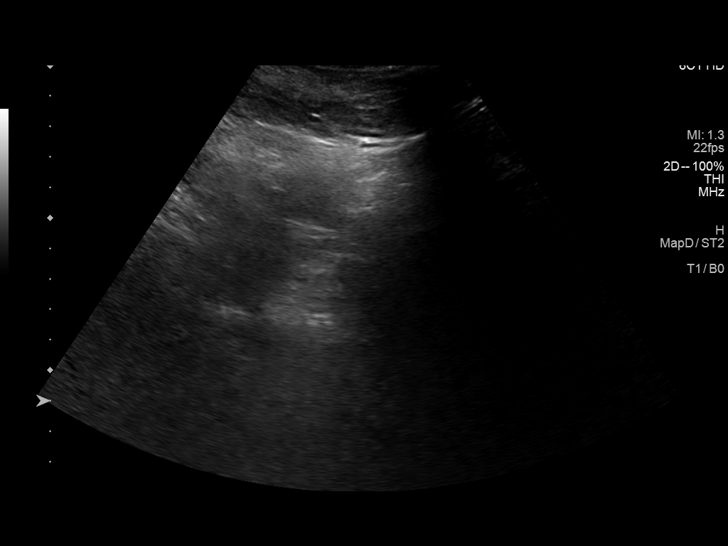

[14 of 25 positions shown; findings below may reference images not displayed]

FINDINGS: Right Kidney:

Renal measurements: 9.6 x 3.5 x 3.6 cm = volume: 63.2 mL.
Echogenicity within normal limits. No mass or hydronephrosis
visualized.

Left Kidney:

Renal measurements: 9.5 x 4.1 x 5.2 cm = volume: 101.9 mL.
Echogenicity within normal limits. No mass or hydronephrosis
visualized.

Bladder:

Not visualized.

Other:

None.
IMPRESSION: No hydronephrosis.

## 2021-08-13 ENCOUNTER — Telehealth: Payer: Self-pay

## 2021-08-13 NOTE — Telephone Encounter (Signed)
Received call from Ms. Balgobin this morning stating she is overdue for her follow up appointment with Dr. Denman [name]. Advised patient that Dr. Denman [name] is no longer with our practice. Someone from our office will call her to schedule a follow up appointment. Patient verbalized understanding.

## 2021-08-14 ENCOUNTER — Telehealth: Payer: Self-pay | Admitting: *Deleted

## 2021-08-14 NOTE — Telephone Encounter (Signed)
Called and scheduled the patient for a follow up appt with Dr Berline Lopes on 1/16 at 4 pm

## 2021-08-17 ENCOUNTER — Encounter: Payer: Self-pay | Admitting: Gynecologic Oncology

## 2021-08-17 ENCOUNTER — Inpatient Hospital Stay (HOSPITAL_BASED_OUTPATIENT_CLINIC_OR_DEPARTMENT_OTHER): Payer: BC Managed Care – PPO | Admitting: Gynecologic Oncology

## 2021-08-17 ENCOUNTER — Other Ambulatory Visit: Payer: Self-pay

## 2021-08-17 ENCOUNTER — Inpatient Hospital Stay: Payer: BC Managed Care – PPO | Attending: Gynecologic Oncology | Admitting: Gynecologic Oncology

## 2021-08-17 VITALS — BP 138/64 | HR 65 | Temp 98.3°F | Resp 18 | Ht 63.39 in | Wt 217.0 lb

## 2021-08-17 DIAGNOSIS — Z79899 Other long term (current) drug therapy: Secondary | ICD-10-CM | POA: Diagnosis not present

## 2021-08-17 DIAGNOSIS — K219 Gastro-esophageal reflux disease without esophagitis: Secondary | ICD-10-CM | POA: Insufficient documentation

## 2021-08-17 DIAGNOSIS — I4891 Unspecified atrial fibrillation: Secondary | ICD-10-CM | POA: Insufficient documentation

## 2021-08-17 DIAGNOSIS — Z8543 Personal history of malignant neoplasm of ovary: Secondary | ICD-10-CM | POA: Insufficient documentation

## 2021-08-17 DIAGNOSIS — N939 Abnormal uterine and vaginal bleeding, unspecified: Secondary | ICD-10-CM | POA: Insufficient documentation

## 2021-08-17 DIAGNOSIS — N189 Chronic kidney disease, unspecified: Secondary | ICD-10-CM | POA: Diagnosis not present

## 2021-08-17 DIAGNOSIS — I129 Hypertensive chronic kidney disease with stage 1 through stage 4 chronic kidney disease, or unspecified chronic kidney disease: Secondary | ICD-10-CM | POA: Diagnosis not present

## 2021-08-17 DIAGNOSIS — E039 Hypothyroidism, unspecified: Secondary | ICD-10-CM | POA: Diagnosis not present

## 2021-08-17 DIAGNOSIS — Z90722 Acquired absence of ovaries, bilateral: Secondary | ICD-10-CM | POA: Insufficient documentation

## 2021-08-17 DIAGNOSIS — K760 Fatty (change of) liver, not elsewhere classified: Secondary | ICD-10-CM | POA: Insufficient documentation

## 2021-08-17 DIAGNOSIS — Z170 Estrogen receptor positive status [ER+]: Secondary | ICD-10-CM | POA: Diagnosis not present

## 2021-08-17 DIAGNOSIS — Z923 Personal history of irradiation: Secondary | ICD-10-CM | POA: Insufficient documentation

## 2021-08-17 DIAGNOSIS — R1031 Right lower quadrant pain: Secondary | ICD-10-CM | POA: Insufficient documentation

## 2021-08-17 DIAGNOSIS — Z932 Ileostomy status: Secondary | ICD-10-CM | POA: Diagnosis not present

## 2021-08-17 DIAGNOSIS — N9089 Other specified noninflammatory disorders of vulva and perineum: Secondary | ICD-10-CM | POA: Insufficient documentation

## 2021-08-17 DIAGNOSIS — E1122 Type 2 diabetes mellitus with diabetic chronic kidney disease: Secondary | ICD-10-CM | POA: Insufficient documentation

## 2021-08-17 DIAGNOSIS — Z7901 Long term (current) use of anticoagulants: Secondary | ICD-10-CM | POA: Insufficient documentation

## 2021-08-17 DIAGNOSIS — Z9221 Personal history of antineoplastic chemotherapy: Secondary | ICD-10-CM | POA: Diagnosis not present

## 2021-08-17 DIAGNOSIS — Z79811 Long term (current) use of aromatase inhibitors: Secondary | ICD-10-CM | POA: Insufficient documentation

## 2021-08-17 DIAGNOSIS — C50212 Malignant neoplasm of upper-inner quadrant of left female breast: Secondary | ICD-10-CM | POA: Diagnosis not present

## 2021-08-17 DIAGNOSIS — Z8542 Personal history of malignant neoplasm of other parts of uterus: Secondary | ICD-10-CM | POA: Diagnosis not present

## 2021-08-17 DIAGNOSIS — Z90710 Acquired absence of both cervix and uterus: Secondary | ICD-10-CM | POA: Insufficient documentation

## 2021-08-17 DIAGNOSIS — Z933 Colostomy status: Secondary | ICD-10-CM | POA: Insufficient documentation

## 2021-08-17 DIAGNOSIS — Z9013 Acquired absence of bilateral breasts and nipples: Secondary | ICD-10-CM | POA: Diagnosis not present

## 2021-08-17 DIAGNOSIS — L089 Local infection of the skin and subcutaneous tissue, unspecified: Secondary | ICD-10-CM | POA: Insufficient documentation

## 2021-08-17 MED ORDER — DOXYCYCLINE HYCLATE 50 MG PO CAPS: 50.0000 mg | ORAL_CAPSULE | Freq: Two times a day (BID) | ORAL | 0 refills | Status: AC

## 2021-08-17 NOTE — Patient Instructions (Addendum)
Dr. Berline Lopes has sent in doxycycline in to your pharmacy. Plan to have a CT scan and Dr. Berline Lopes will notify you of the results.   We are going to get you scheduled for an outpatient procedure at the [facility]. There may be a chance where they feel it would be safer for you to have this at the [facility]) and we will let you know this information.   Preparing for your Surgery  Plan for surgery on August 19, 2021 with Dr. Jeral Pinch at [facility]. You will be scheduled for an examination under anesthesia, possible biopsies, excision of left vulvar lesion.   Pre-operative Testing -You will receive a phone call from presurgical testing at [facility] to discuss surgery instructions and arrange for lab work if needed.  -Bring your insurance card, copy of an advanced directive if applicable, medication list.  -You should not be taking blood thinners or aspirin at least ten days prior to surgery unless instructed by your surgeon.  -Do not take supplements such as fish oil (omega 3), red yeast rice, turmeric before your surgery. You want to avoid medications with aspirin in them including headache powders such as BC or Goody's), Excedrin migraine.  Day Before Surgery at [facility] will be advised you can have clear liquids up until 3 hours before your surgery.    Your role in recovery Your role is to become active as soon as directed by your doctor, while still giving yourself time to heal.  Rest when you feel tired. You will be asked to do the following in order to speed your recovery:  - Cough and breathe deeply. This helps to clear and expand your lungs and can prevent pneumonia after surgery.  - [place]. Do mild physical activity. Walking or moving your legs help your circulation and body functions return to normal. Do not try to get up or walk alone the first time after surgery.   -If  you develop swelling on one leg or the other, pain in the back of your leg, redness/warmth in one of your legs, please call the office or go to the Emergency Room to have a doppler to rule out a blood clot. For shortness of breath, chest pain-seek care in the Emergency Room as soon as possible. - Actively manage your pain. Managing your pain lets you move in comfort. We will ask you to rate your pain on a scale of zero to 10. It is your responsibility to tell your doctor or nurse where and how much you hurt so your pain can be treated.  Special Considerations -Your final pathology results from surgery should be available around one week after surgery and the results will be relayed to you when available.  -FMLA forms can be faxed to [phone] and please allow 5-7 business days for completion.  Pain Management After Surgery  -Make sure that you have Tylenol at home to use on a regular basis if you are able to take this after surgery for pain control.   Bowel Regimen - It is important to prevent constipation and drink adequate amounts of liquids.   Risks of Surgery Risks of surgery are low but include bleeding, infection, damage to surrounding structures, re-operation, blood clots, and very rarely death.  AFTER SURGERY INSTRUCTIONS  Return to work: 1-2 days if applicable  We recommend purchasing several bags of frozen green peas and dividing them  into ziploc bags. You will want to keep these in the freezer and have them ready to use as ice packs to the vulvar incision. Once the ice pack is no longer cold, you can get another from the freezer. The frozen peas mold to your body better than a regular ice pack.   Activity: 1. Be up and out of the bed during the day.  Take a nap if needed.  You may walk up steps but be careful and use the hand rail.  Stair climbing will tire you more than you think, you may need to stop part way and rest.   2. No lifting or straining for 2 weeks over 10  pounds. No pushing, pulling, straining for 2 weeks.  3. No driving for minimum 24 hours after surgery if you were cleared to drive before surgery.  Do not drive if you are taking narcotic pain medicine and make sure that your reaction time has returned.   4. You can shower as soon as the next day after surgery. Shower daily. No tub baths or submerging your body in water until cleared by your surgeon. If you have the soap that was given to you by pre-surgical testing that was used before surgery, you do not need to use it afterwards because this can irritate your incisions.   5. No sexual activity and nothing in the vagina for 4 weeks.  6. You may experience vaginal spotting and discharge after surgery.  The spotting is normal but if you experience heavy bleeding, call our office.  7. Take Tylenol for pain. Monitor your Tylenol intake to a max of 4,000 mg in a 24 hour period.   Diet: 1. Low sodium Heart Healthy Diet is recommended but you are cleared to resume your normal (before surgery) diet after your procedure.  2. It is safe to use a laxative, such as Miralax or Colace, if you have difficulty moving your bowels.   Wound Care: 1. Keep clean and dry.  Shower daily.  Reasons to call the Doctor: Fever - Oral temperature greater than 100.4 degrees Fahrenheit Foul-smelling vaginal discharge Difficulty urinating Nausea and vomiting Increased pain at the site of the incision that is unrelieved with pain medicine. Difficulty breathing with or without chest pain New calf pain especially if only on one side Sudden, continuing increased vaginal bleeding with or without clots.   Contacts: For questions or concerns you should contact:  Dr. Jeral Pinch at [phone]  Joylene John, NP at [phone]  After Hours: call [phone] and have the GYN Oncologist paged/contacted (after 5 pm or on the weekends).  Messages sent via mychart are for non-urgent matters and are not responded  to after hours so for urgent needs, please call the after hours number.

## 2021-08-17 NOTE — Progress Notes (Signed)
Gynecologic Oncology Return Clinic Visit ° °08/17/2021 ° °Reason for Visit: Reestablishing care in the setting of a complex history of both endometrial and ovarian cancer ° °Treatment History: °Oncology History Overview Note  °Breast cancer of upper-inner quadrant of left female breast (HCC) °  Staging form: Breast, AJCC 7th Edition °    Clinical stage from 12/31/2015: Stage IIA (T2, N0, M0) - Signed by [name] Gorsuch, MD on 12/31/2015 ° °ER,PR positive Her2 neg °  °History of ovarian & endometrial cancer  °10/15/2004 Initial Diagnosis  ° History of ovarian cancer, treated with chemotherapy carbo/Taxol °  °03/17/2009 Bone Marrow Biopsy  ° Bone marrow biopsy at [facility] showed neutropenia °  °01/23/2014 Bone Marrow Biopsy  ° Repeat bone marrow biopsy showed neutropenia °  °09/12/2014 Imaging  ° CT scan showed possible cancer °  °10/14/2014 Imaging  ° MRI show significant pelvic mass without invasion into the bladder but abutting to the rectum °  °11/19/2014 Surgery  ° she underwent surgery and had robotic-assisted lysis of adhesions, converted to laparotomy, radical upper vaginectomy and low anterior resection with colostomy. Bilateral ureteral stent placement and cystotomy repair °  °11/19/2014 Pathology Results  ° Pathology Accession: [id number] showed recurrent endometrioid carcinoma with squamous differentiation involving the colonic mucosa and vagina mucosa. Resection margins were negative °  °01/29/2015 - 03/10/2015 Radiation Therapy  ° She received adjuvant radiation °  °03/03/2015 Imaging  ° CT scan of the abdomen and pelvis showed status post interval removal of the bilateral nephroureteral stents. Worsening moderate right hydroureteronephrosis. Resolved left hydroureteronephrosis. °  °03/20/2015 Procedure  ° she had cystoscopy and stent placement for right hydronephrosis °  °05/02/2015 Surgery  ° Cystoscopy with right retrograde pyelogram interpretation. Diagnostic ureteroscopy. Removal of right ureteral stent. °   °05/14/2015 Imaging  ° CT scan of the abdomen and pelvis show unilateral right hydronephrosis with no residual cancer °  °08/18/2015 Imaging  ° CT scan showed hysterectomy with stable presacral soft tissue thickening. No definitive evidence of recurrent or metastatic disease. Severe right hydronephrosis °  °09/12/2015 Surgery  ° Cystoscopy with right retrograde pyelogram interpretation. Right ureteral stent placement 5 x 24 Polaris, no tether °  °10/07/2016 Imaging  ° Stable exam.  No new or progressive findings. 2. Stable appearance abnormal presacral soft tissue compatible with post treatment change. 3. Internal right ureteral stent with persistent right hydroureteronephrosis. Differentially decreased perfusion of the right kidney suggests a component of underlying obstructive uropathy. 4. Stable 13 mm left adrenal nodule, previously characterized as adenoma. °  °  °  °01/27/2017 Imaging  ° Stable postop and post radiation changes in presacral region. No evidence of recurrent or metastatic carcinoma within the chest, abdomen, or pelvis. Stable mild to moderate right hydroureteronephrosis, with right ureteral stent in appropriate position. Stable small benign left adrenal adenoma and left thyroid lobe nodule. °  °10/02/2019 Tumor Marker  ° Patient's tumor was tested for the following markers: CA-125 °Results of the tumor marker test revealed 5.8 °  °Breast cancer of upper-inner quadrant of left female breast (HCC)  °10/14/2015 Imaging  ° Screening mammogram showed possible distortion in the left breast. °  °10/24/2015 Imaging  ° Targeted ultrasound is performed, showing a 0.6 x 0.8 x 0.9 cm area of hypoechoic distortion at the 10 o'clock position of the left breast 5 cm from the nipple °  °10/24/2015 Imaging  ° Diagnostic imaging confirmed 0.6 x 0.8 x 0.9 cm distortion in the upper inner left breast °  °10/30/2015 Procedure  °   Left US guided biopsy was performed °  °10/30/2015 Pathology Results  ° Accession: [id number] showed  invasive ductal carcinoma, ER/PR positive, Her2 neg °  °11/05/2015 Genetic Testing  ° Genetic testing did not reveal a deleterious mutation.  Genetic testing did detect a Variant of Unknown Significance in the MSH6 gene called c.389A>G..  Genes tested include: APC, ATM, AXIN2, BARD1, BMPR1A, BRCA1, BRCA2, BRIP1, CDH1, CDKN2A, CHEK2, DICER1, EPCAM, GREM1, KIT, MEN1, MLH1, MSH2, MSH6, MUTYH, NBN, NF1, PALB2, PDGFRA, PMS2, POLD1, POLE, PTEN, RAD50, RAD51C, RAD51D, SDHA, SDHB, SDHC, SDHD, SMAD4, SMARCA4. STK11, TP53, TSC1, TSC2, and VHL. ° °UPDATE: MSH6 c.389A>G (p.His130Arg) has been reclassified as Likely Benign.  The amended report date is February 25, 2021. °  °12/11/2015 Surgery  ° She undwerwent bilateral mastectomy, left SLN biopsy and immediate reconstruction surgeries with bilateral plasma expanders °  °12/11/2015 Pathology Results  ° Accession: [id number] mastectomy specimens showed left breast ca, pT2N0M0 °  °12/26/2015 Pathology Results  ° Accession: [id number] specimen from debridement showed no cancer °  °12/26/2015 Surgery  ° she underwent surgical debridement of necrotic tissue at site of plasma expander °  °01/12/2016 Imaging  ° Bone density is normal °  °02/02/2016 -  Anti-estrogen oral therapy  ° She started taking Arimidex °  °10/21/2017 Procedure  ° Ultrasound guided biopsy of a vague hypoechoic shadowing lesion, generally corresponding to area palpable abnormality, along chest lateral to the reconstructed left breast and inferior to the left axilla. No apparent complications. °  °01/17/2018 Imaging  ° Bone density scan is normal °  ° °[name] is a 66 y.o. 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  °She has a history of endometrial and ovarian endometrioid carcinoma treated in 2006 by Dr. Samuel [name] at [facility] in [place]. Her surgery (TAH, BSO) was followed by  adjuvant chemotherapy with carboplatin plus paclitaxel due to the ovarian involvement and the presence of a cul de sac lesion also positive for disease. She denies receiving adjuvant radiation therapy. She is unclear if she had metastatic endometrial cancer to the ovary or duel primaries. She had a complete response to therapy however developed leukopenia in July 2010. 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 She sees Dr. Gorsuch for this and is treated with G-CSF injections. °  °She began experiencing rectal pain approximately in January 2016. She also reports narrowing of caliber of the stool. She denies hematochezia. She does report approximately 3 months of vaginal spotting. She's had no specific follow-up for her gynecologic cancers in the past 4 years. °  °As part of workup of her rectal pain she underwent a CT scan of the abdomen and pelvis on 09/12/2014. This demonstrated a new right perirectal mass abutting the vaginal cuff measuring 3.8 x 4.9 cm. There is a limited fat plane between the mass and the rectum posteriorly. Rectal invasion could not be excluded. There were no other masses identified in the abdomen and pelvis or lymphadenopathy. There is no other evidence of metastatic disease or recurrent disease. There was no hydronephrosis. A CA-125 drawn on 09/12/2014 was normal at 10. °  °PET was negative for extrapelvic disease. °MRI defined the lesion as a 3.5x5cm lesion to the right of the rectum at the vaginal cuff. °  °Colonoscopy was performed on 10/02/14 with transrectal US and biopsy and this revealed   endometrioid adenocarcinoma. Of note, the lesion was not seen within the lumen of the rectum.  °  °She then underwent a posterior supralevator exenteration with colostomy and bladder repair and stent placement on 11/19/14 without complications with Dr Rossi and Dr Gross.  Her postoperative  course was uncomplicate with the exception of development of postop anemia.  Her final pathology revealed endometrioid adenocarcinoma invading the vagina and rectum with negative margins on the specimen. The margin had been close (clinically) to the right pelvic sidewall which was marked with surgical clips. 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 It was treated with IV antibiotics and then on POD 15 she was taken to the OR for laparoscopic revision of the stoma with Dr Steven Gross. Postoperatively she had wound vac and packing for her stomal wound. °  °A retrograde cystogram on week 4 postop confirmed an intact bladder and the foley was removed. °  °She initially had some voiding issues with decreased sensation to void. We tested a post void residual in May, 2016 and this revealed adequate voiding. °  °On 12/30/14 she was admitted to [facility] with sepsis associated with Enterobacter Cloacae UTI. This was treated with IV antibiotics and then a prolonged course of oral cipro. Imaging performed at the time of admission (a CT of the abdo/pelvis) revealed: Bilateral double-J internal ureteral stents in adequate position with persistent mild bilateral hydronephrosis  °  °She complete radiation therapy from 01/29/15 to 03/10/15 with 50Gy of external beam radiation and IMRT. She tolerated therapy well with minor skin irritation. °  °She had her ureteral stents removed in June, 2016, however, then developed right ureteral obstruction, hydroureter and pain. She went to the OR with Urologist Dr Manny on 03/20/15 for ureteral dilation and right stent placement (the left was draining well). No tumor was seen on cysto.  °  °CT abdo/pelvis on 05/14/15 showed: no new lesions, stable thickening in right pelvis/distal right ureter consistent with radiation effect. °The right ureteral stent was removed. °  °The patient's CT in January 2017 showed  progression of her right ureteral obstruction after stent removal. Dr Manny took her to the OR on 09/05/15 for a cystoscopy, retrograde pyelogram and right ureteral stent placement.  °  °The CT on 08/18/15 showed decreased attenuation of the right renal parenchyma, right hydronephrosis that was severe no excretion of contrast on that for graphic phase imaging. The perivascular soft tissue thickening in the pelvis and presacral regions grossly stable consistent with radiation changes. °  °The patient was diagnosed with ER/PR positive left breast cancer in April 2017. Surgery is scheduled for May. She underwent bilateral mastectomy with expander reconstruction. Postoperatively she was treated with Arimidex.  °  °Repeat CT imaging on 02/08/16 revealed : Stable post treatment changes in presacral region. No evidence of recurrent or metastatic carcinoma within the abdomen or pelvis. Interval placement of right ureteral stent in appropriate position. °Moderate right hydroureteronephrosis remains stable. Stable benign left adrenal adenoma and hepatic steatosis. °  °On 06/30/16 she had replacement of her right ureteral stent with Dr Manny. °  °On 10/07/16 CT surveillance was performed which showed: Stable exam.  No new or progressive findings. Stable appearance abnormal presacral soft tissue compatible with post treatment change. Internal right ureteral stent with persistent right hydroureteronephrosis. Differentially decreased perfusion of the right kidney suggests a component of underlying obstructive uropathy. Stable 13 mm left adrenal nodule,   previously characterized as adenoma. A parastomal hernia was noted. °  °CT 01/27/17 showed stable findings with no evidence of recurrence. Persistent right hydroureter was again noted. °  °Her colostomy bothered her greatly and she hoped to be free of stomal appliances. She was frustrated with repetitive stent replacements and desired definitive reconstruction/reimplantation of the right  ureter.   °  °Given that a 2 year disease free interval had elapsed, in 04/01/17 Dr Manny and Dr Gross returned to the operating room with Ms Cheuvront for a right ureteral resection, and bladder hitch with reimplantation and reversal of hartman's with temporary ileostomy complicated by postop development of urinoma which required a peritoneal drain and right ureteral stent. °  °She subsequently developed a colovesicular fistula which caused breakdown of the skin on her buttocks from chronic maceration and immobility due to her convalescence. °She was offered permanent diversion with ileal conduit and permanent end colostomy. This was not considered a reasonable option at that time by Ji. °She became quite depressed by the slow recovery and sequelae of her last surgery. °She sought second opinion with Gynecologic Oncology at [facility]. They offered referral to urology and colorectal specialists at [facility]. They recommended no additional cancer treatment as she was disease free. She is being worked up by them for possible laparotomy, revision of right uretero-neocystotomy, placement of VRAM flap, and possible bowel resection and revision of ileostomy. Prior to embarking on this surgery they requested she lose weight to a BMI of <30kg/m2. ° °She had lost weight to a BMI <30 throughout 2020. °She experienced some episodes of passage of bloody mucous from the vagina in May, 2020 and so an CT scan was performed which showed no signs of pelvic recurrence.  °  °In September the patient embarked on a large procedure at [facility].  She underwent an exploratory laparotomy panniculectomy cystectomy creation of urinary conduit with ileum, proctectomy, and formation of permanent and colostomy.  The procedure was uncomplicated.  She reported that while plastic surgery had planned to take part in the procedure with placement of a flap, it was determined that this was not necessary intraoperatively.  While the procedure  note in Care Everywhere states that a hysterectomy was performed, this had already been performed more than a decade prior.  Additionally there was no myocutaneous flap placed despite the procedure reporting as such.  No malignancy was identified in the specimens. °  °The patient had an uncomplicated procedure overall and a 7-day hospital stay with subsequent rehab.  She developed some wound healing issues at her colostomy site. °  °She continues to have persistent vaginal drainage which she had hoped would go away permanently after the procedure.  °She was treated with an oral antibiotic recently by Dr Buccini to control gas production in her stoma. It was beneficial for this but also for her vaginal drainage. °After she stopped the course of antibiotic the discharge picked up again. °At her last visit I had counseled her about usage of a tampon to absorb the drainage. She elected to not do this because she was worried about damaging something or stopping the drainage from coming out and causing infection. ° °Interval History: °The patient was last seen in our clinic by Dr. Rossi in June 2021.  In the setting of continued vaginal bleeding and drainage, she sought consultation at [facility] with Dr. Berchuck.  On exam, vagina noted to be atrophic.  Blood was seen from the apex of the vaginal vault and on   bimanual exam the vault and apex extended posteriorly towards the sacrum without an obvious defect.  Given some concern for possibility of osteomyelitis this driving this drainage, MRI was performed.  MRI on 09/27/2020 showed a 2.3 x 1.9 x 3.3 cm fluid collection that appears contiguous with the rectal pouch via thickened fluid-filled tract and questionable thin fistulous tract anteriorly to the vaginal cuff.  Marked presacral soft tissue thickening and stranding likely reflecting posttreatment changes noted.  No definite evidence of local recurrence.  Endorses continued vaginal and rectal bleeding, requires several  depens changes daily.  She continues to be quite bothered by this drainage.  Feels like things worsened after her consultation at [facility] and the exam at that visit.  She has subsequently been diagnosed with atrial fibrillation and she was started on Eliquis.  She is taking this only once a day instead of twice a day because she was seeing increase in her vaginal rectal bleeding as well as bruising.  She is noticed a white bump in her left groin area that is growing, causing her pain and discomfort related to the clothing she is wearing.  She is very concerned about this and would like it removed.  She continues to struggle with her ostomy pouch on the left.  She thinks that she has an allergy to adhesive although does not have the same issue with her urostomy pouch.  Is wearing a ostomy belt at night to help take some of the tension off her skin.  This is causing increased pain related to her right hip and back pain.  She also notes more recent symptoms of feeling bloated and gassy, both of which are new for her.  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+, HER2 negative/  12-11-2015 bilateral mastecotmy w/ reconstruction (no radiation and no chemo)   Cancer of corpus uteri, except isthmus [facility])  oncologist-- dr Denman [name] and dr Alvy Bimler    10-15-2004  dx endometroid endometrial and ovarian cancer s/p  chemotheapy and surgery(TAH w/ BSO) :  recurrent 11-19-2014 post pelvic surgery and radiation 01-29-2015 to 03-10-2015   Chronic idiopathic neutropenia ([place])    presumed related to chemotherapy March 2006--- followed by dr Alvy Bimler (treatment w/ G-CSF injections   Chronic nausea    Chronic pain    perineal/ anal  area from  bladder pad irritates skin , right flank pain   CKD stage G2/A3, GFR 60-89 and albumin creatinine ratio >300 mg/g    nephrologist-  dr Madelon Lips   Colovesical fistula    Diabetic retinopathy, background ([place])    Difficult intravenous access    small veins--- hx PICC lines   DM type 2 (diabetes mellitus, type 2) ([place])    monitored by dr Legrand Como altheimer   Dysuria    Environmental and seasonal allergies    Fatty liver 02/08/2016   CT   Generalized muscle weakness    GERD (gastroesophageal reflux disease)    Hiatal hernia    History of abdominal abscess 04/16/2017   post surgery 04-01-2017  --- resolved 10/ 2018   History of gastric polyp    2014  duodenum   History of ileus 04/16/2017  resolved w/ no surgical intervention  ° History of radiation therapy   ° 01-29-2015 to 03-10-2015  pelvis 50.4Gy  ° Hypothyroidism   ° monitored by dr michael altheimer  ° IBS (irritable bowel syndrome)   ° Ileostomy in place (HCC) 04/01/2017  ° created at same time colostomy takedown.  ° Joint pain   ° Leg edema   ° Lower urinary tract symptoms (LUTS)   ° urge urinary  incontinence  ° Mixed dyslipidemia   ° Multiple thyroid nodules   ° Managed by Dr. Gerkin  ° Nephrostomy status (HCC)   ° Palpitations   ° Pelvic abscess in female 04/16/2017  ° PONV (postoperative nausea and vomiting)   ° "scopolamine patch works for me"  ° Radiation-induced dermatitis   ° contact dermatitis , radiation completed, rash only on ankles now.  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  ° Vitamin D deficiency   ° Wears glasses   ° ° °Past Surgical History:  °Procedure Laterality Date  ° APPENDECTOMY    ° biopsy thyroid nodules    ° BREAST RECONSTRUCTION WITH PLACEMENT OF TISSUE EXPANDER AND FLEX HD (ACELLULAR HYDRATED DERMIS) Bilateral 12/11/2015  ° Procedure: BILATERAL  BREAST RECONSTRUCTION WITH PLACEMENT OF TISSUE EXPANDERS;  Surgeon: Brinda Thimmappa, MD;  Location: [facility];  Service: Plastics;  Laterality: Bilateral;  ° COLONOSCOPY WITH PROPOFOL N/A 08/21/2013  ° Procedure: COLONOSCOPY WITH PROPOFOL;  Surgeon: Robert V Buccini, MD;  Location: [facility];  Service: Endoscopy;  Laterality: N/A;  ° COLOSTOMY TAKEDOWN N/A 12/04/2014  ° Procedure: LAPROSCOPIC LYSIS OF ADHESIONS, SPLENIC MOBILIZATION, RELOCATION OF COLOSTOMY, DEBRIDEMENT INITIAL COLOSTOMY SITE;  Surgeon: Steven Gross, MD;  Location: [facility];  Service: General;  Laterality: N/A;  ° CYSTOGRAM N/A 06/01/2017  ° Procedure: CYSTOGRAM;  Surgeon: Manny, Theodore, MD;  Location: [facility];  Service: Urology;  Laterality: N/A;  ° CYSTOSCOPY W/ RETROGRADES Right 11/21/2015  ° Procedure: CYSTOSCOPY WITH RETROGRADE PYELOGRAM;  Surgeon: Theodore Manny, MD;  Location: [facility];  Service: Urology;  Laterality: Right;  ° CYSTOSCOPY W/ URETERAL STENT PLACEMENT Right 11/21/2015  ° Procedure: CYSTOSCOPY WITH STENT REPLACEMENT;  Surgeon: Theodore Manny, MD;  Location: [facility];  Service: Urology;  Laterality: Right;  ° CYSTOSCOPY W/ URETERAL STENT PLACEMENT Right 03/10/2016  ° Procedure: CYSTOSCOPY WITH STENT REPLACEMENT;  Surgeon: Theodore Manny, MD;  Location: [name];  Service: Urology;  Laterality: Right;  ° CYSTOSCOPY W/ URETERAL STENT PLACEMENT Right 06/30/2016  ° Procedure: CYSTOSCOPY WITH RETROGRADE PYELOGRAM/URETERAL STENT EXCHANGE;  Surgeon: Theodore Manny, MD;  Location: [facility];  Service: Urology;  Laterality: Right;  ° CYSTOSCOPY W/ URETERAL STENT PLACEMENT N/A 06/01/2017  ° Procedure: CYSTOSCOPY WITH EXAM UNDER ANESTHESIA;  Surgeon: Manny, Theodore, MD;  Location: [facility];  Service: Urology;  Laterality: N/A;  ° CYSTOSCOPY W/ URETERAL STENT PLACEMENT Right 08/17/2017  ° Procedure: CYSTOSCOPY WITH RETROGRADE PYELOGRAM/URETERAL STENT REMOVAL;  Surgeon: Manny, Theodore, MD;  Location: [facility];  Service: Urology;  Laterality: Right;  ° CYSTOSCOPY WITH RETROGRADE PYELOGRAM, URETEROSCOPY AND STENT PLACEMENT Right 03/20/2015  ° Procedure: CYSTOSCOPY WITH RETROGRADE PYELOGRAM, URETEROSCOPY WITH BALLOON DILATION AND STENT PLACEMENT ON RIGHT;  Surgeon: Theodore Manny, MD;  Location: [facility];  Service: Urology;  Laterality: Right;  ° CYSTOSCOPY WITH RETROGRADE PYELOGRAM, URETEROSCOPY AND STENT PLACEMENT Right 05/02/2015  °   Procedure: CYSTOSCOPY WITH RIGHT RETROGRADE PYELOGRAM,  DIAGNOSTIC URETEROSCOPY AND STENT PULL ;  Surgeon: Alexis Frock, MD;  Location: [facility];  Service: Urology;  Laterality: Right;   CYSTOSCOPY WITH RETROGRADE PYELOGRAM, URETEROSCOPY AND STENT PLACEMENT Right 09/05/2015   Procedure: CYSTOSCOPY WITH RETROGRADE PYELOGRAM,  AND STENT PLACEMENT;  Surgeon: Alexis Frock, MD;  Location: [facility];  Service: Urology;  Laterality: Right;   CYSTOSCOPY WITH RETROGRADE PYELOGRAM, URETEROSCOPY AND STENT PLACEMENT Right 04/01/2017   Procedure: CYSTOSCOPY WITH RETROGRADE PYELOGRAM, URETEROSCOPY AND STENT PLACEMENT;  Surgeon: Alexis Frock, MD;  Location: [facility];  Service: Urology;  Laterality: Right;   CYSTOSCOPY WITH STENT PLACEMENT Right 10/27/2016   Procedure: CYSTOSCOPY WITH STENT CHANGE and right retrograde pyelogram;  Surgeon: Alexis Frock, MD;  Location: [facility];  Service: Urology;  Laterality: Right;   EUS N/A 10/02/2014   Procedure: LOWER ENDOSCOPIC ULTRASOUND (EUS);  Surgeon: Arta Silence, MD;  Location: [facility];  Service: Endoscopy;  Laterality: N/A;   EXCISION SOFT TISSUE MASS RIGHT FOREMAN  12-08-2006   EYE SURGERY  as child   pytosis of eyelids repair   INCISION AND DRAINAGE OF WOUND Bilateral 12/26/2015   Procedure: DEBRIDEMENT OF BILATERAL MASTECTOMY FLAPS;  Surgeon: Irene Limbo, MD;  Location: [facility];  Service: Plastics;  Laterality: Bilateral;   IR CV LINE INJECTION  05/31/2017   IR  FLUORO GUIDE CV LINE LEFT  05/31/2017   IR FLUORO GUIDE CV LINE RIGHT  04/06/2017   IR FLUORO GUIDE CV MIDLINE PICC RIGHT  05/30/2017   IR NEPHROSTOGRAM LEFT INITIAL PLACEMENT  09/02/2017   IR NEPHROSTOGRAM LEFT THRU EXISTING ACCESS  11/29/2017   IR NEPHROSTOGRAM RIGHT INITIAL PLACEMENT  09/02/2017   IR NEPHROSTOGRAM RIGHT THRU EXISTING ACCESS  09/13/2017   IR NEPHROSTOGRAM RIGHT THRU EXISTING ACCESS  11/29/2017   IR NEPHROSTOMY EXCHANGE LEFT  11/28/2017   IR NEPHROSTOMY EXCHANGE LEFT  01/05/2018   IR NEPHROSTOMY EXCHANGE LEFT  02/16/2018   IR NEPHROSTOMY EXCHANGE LEFT  03/30/2018   IR NEPHROSTOMY EXCHANGE LEFT  05/12/2018   IR NEPHROSTOMY EXCHANGE LEFT  06/21/2018   IR NEPHROSTOMY EXCHANGE LEFT  08/04/2018   IR NEPHROSTOMY EXCHANGE LEFT  09/18/2018   IR NEPHROSTOMY EXCHANGE LEFT  10/09/2018   IR NEPHROSTOMY EXCHANGE LEFT  10/27/2018   IR NEPHROSTOMY EXCHANGE LEFT  11/21/2018   IR NEPHROSTOMY EXCHANGE LEFT  01/05/2019   IR NEPHROSTOMY EXCHANGE LEFT  02/15/2019   IR NEPHROSTOMY EXCHANGE LEFT  03/29/2019   IR NEPHROSTOMY EXCHANGE RIGHT  10/02/2017   IR NEPHROSTOMY EXCHANGE RIGHT  11/28/2017   IR NEPHROSTOMY EXCHANGE RIGHT  01/05/2018   IR NEPHROSTOMY EXCHANGE RIGHT  02/16/2018   IR NEPHROSTOMY EXCHANGE RIGHT  03/30/2018   IR NEPHROSTOMY EXCHANGE RIGHT  05/12/2018   IR NEPHROSTOMY EXCHANGE RIGHT  06/21/2018   IR NEPHROSTOMY EXCHANGE RIGHT  08/04/2018   IR NEPHROSTOMY EXCHANGE RIGHT  09/18/2018   IR NEPHROSTOMY EXCHANGE RIGHT  10/27/2018   IR NEPHROSTOMY EXCHANGE RIGHT  11/21/2018   IR NEPHROSTOMY EXCHANGE RIGHT  01/05/2019   IR NEPHROSTOMY EXCHANGE RIGHT  02/15/2019   IR NEPHROSTOMY EXCHANGE RIGHT  03/29/2019   IR NEPHROSTOMY PLACEMENT LEFT  10/02/2017   IR RADIOLOGIST EVAL & MGMT  05/03/2017   IR US GUIDE VASC ACCESS LEFT  05/31/2017   IR US GUIDE VASC ACCESS RIGHT  04/06/2017   IR US GUIDE VASC ACCESS RIGHT  05/30/2017   LAPAROSCOPIC CHOLECYSTECTOMY  1990   LIPOSUCTION WITH LIPOFILLING Bilateral 04/16/2016  Procedure:  LIPOSUCTION WITH LIPOFILLING TO BILATERAL CHEST;  Surgeon: Brinda Thimmappa, MD;  Location: [facility];  Service: Plastics;  Laterality: Bilateral;  ° MASTECTOMY W/ SENTINEL NODE BIOPSY Bilateral 12/11/2015  ° Procedure: RIGHT PROPHYLACTIC MASTECTOMY, LEFT TOTAL MASTECTOMY WITH LEFT AXILLARY SENTINEL LYMPH NODE BIOPSY;  Surgeon: Faera Byerly, MD;  Location: [facility];  Service: General;  Laterality: Bilateral;  ° OSTOMY N/A 11/19/2014  ° Procedure: OSTOMY;  Surgeon: Steven Gross, MD;  Location: [facility];  Service: General;  Laterality: N/A;  ° PROCTOSCOPY N/A 04/01/2017  ° Procedure: RIDGE PROCTOSCOPY;  Surgeon: Gross, Steven, MD;  Location: [facility];  Service: General;  Laterality: N/A;  ° REMOVAL OF BILATERAL TISSUE EXPANDERS WITH PLACEMENT OF BILATERAL BREAST IMPLANTS Bilateral 04/16/2016  ° Procedure: REMOVAL OF BILATERAL TISSUE EXPANDERS WITH PLACEMENT OF BILATERAL BREAST IMPLANTS;  Surgeon: Brinda Thimmappa, MD;  Location: [facility];  Service: Plastics;  Laterality: Bilateral;  ° ROBOTIC ASSISTED LAP VAGINAL HYSTERECTOMY N/A 11/19/2014  ° Procedure: ROBOTIC LYSIS OF ADHESIONS, CONVERTED TO LAPAROTOMY RADICAL UPPER VAGINECTOMY,LOW ANTERIOR BOWEL RESECTION, COLOSTOMY, BILATERAL URETERAL STENT PLACEMENT AND CYSTONOMY CLOSURE;  Surgeon: Emma Rossi, MD;  Location: [facility];  Service: Gynecology;  Laterality: N/A;  ° TISSUE EXPANDER FILLING Bilateral 12/26/2015  ° Procedure: EXPANSION OF BILATERAL CHEST TISSUE EXPANDERS (60 mL- Right; 75 mL- Left);  Surgeon: Brinda Thimmappa, MD;  Location: [facility];  Service: Plastics;  Laterality: Bilateral;  ° TONSILLECTOMY    ° TOTAL ABDOMINAL HYSTERECTOMY  March 2006   [facility]  ° and Bilateral Salpingoophorectomy/  staging for Ovarian cancer/  an  ° XI ROBOTIC ASSISTED LOWER ANTERIOR RESECTION N/A 04/01/2017  ° Procedure: XI ROBOTIC VS LAPAROSCOPIC COLOSTOMY TAKEDOWN WITH LYSIS OF ADHESIONS.;  Surgeon: Gross, Steven, MD;  Location: [facility];   Service: General;  Laterality: N/A;  ERAS PATHWAY  ° ° °Family History  °Problem Relation Age of Onset  ° Cancer Mother 56  °     stomach ca  ° Hypertension Mother   ° Cancer Father 76  °     prostate ca  ° Diabetes Father   ° Heart disease Father   °     CABG  ° Hypertension Father   ° Hyperlipidemia Father   ° Obesity Father   ° Breast cancer Maternal Aunt   °     dx in her 50s  ° Lymphoma Paternal Aunt   ° Brain cancer Paternal Grandfather   ° Ovarian cancer Other   ° Diabetes Sister   ° Hypertension Brother y-10  ° Heart disease Brother   °     CABG  ° Diabetes Brother   ° ° °Social History  ° °Socioeconomic History  ° Marital status: Married  °  Spouse name: Jim Xiong  ° Number of children: 1  ° Years of education: Not on file  ° Highest education level: Not on file  °Occupational History  ° Occupation: retired RN from [employer]  °  Comment: L&D RN - retired  °Tobacco Use  ° Smoking status: Never  ° Smokeless tobacco: Never  °Vaping Use  ° Vaping Use: Never used  °Substance and Sexual Activity  ° Alcohol use: Not Currently  ° Drug use: No  ° Sexual activity: Not on file  °Other Topics Concern  ° Not on file  °Social History Narrative  ° Exercise-- has not gotten back into it since cancer came back  ° °Social Determinants of Health  ° °Financial Resource Strain: Not   on file  °Food Insecurity: Not on file  °Transportation Needs: Not on file  °Physical Activity: Not on file  °Stress: Not on file  °Social Connections: Not on file  ° ° °Current Medications: ° °Current Outpatient Medications:  °  acetaminophen (TYLENOL) 325 MG tablet, Take 2 tablets (650 mg total) by mouth every 6 (six) hours as needed., Disp: 30 tablet, Rfl: 1 °  albuterol (PROVENTIL HFA) 108 (90 Base) MCG/ACT inhaler, Inhale 1 puff into the lungs every 6 (six) hours as needed for wheezing or shortness of breath., Disp: 18 g, Rfl: 2 °  apixaban (ELIQUIS) 5 MG TABS tablet, Take 1 tablet (5 mg total) by mouth 2 (two) times daily., Disp: 180 tablet, Rfl:  1 °  benzonatate (TESSALON) 100 MG capsule, Take 1 capsule (100 mg total) by mouth 3 (three) times daily as needed for cough., Disp: 30 capsule, Rfl: 0 °  Biotin 5 MG TABS, Take 5 mg by mouth daily. , Disp: , Rfl:  °  buPROPion (WELLBUTRIN SR) 150 MG 12 hr tablet, Take 1 tablet (150 mg total) by mouth 2 (two) times daily., Disp: 180 tablet, Rfl: 0 °  calcium citrate-vitamin D (CITRACAL+D) 315-200 MG-UNIT tablet, Take 1 tablet by mouth daily., Disp: , Rfl:  °  Cholecalciferol (VITAMIN D3) 10000 UNITS capsule, Take 10,000 Units by mouth See admin instructions. Take 1 tablet by mouth 4 times per week., Disp: , Rfl:  °  clobetasol (OLUX) 0.05 % topical foam, Apply topically 2 (two) times daily., Disp: 50 g, Rfl: 3 °  diphenhydrAMINE (BENADRYL) 25 mg capsule, Take 1 capsule (25 mg total) by mouth every 8 (eight) hours as needed for itching, allergies or sleep., Disp: 30 capsule, Rfl: 0 °  famotidine-calcium carbonate-magnesium hydroxide (PEPCID COMPLETE) 10-800-165 MG chewable tablet, Chew by mouth as needed., Disp: , Rfl:  °  ferrous sulfate 325 (65 FE) MG tablet, Take 1 tablet (325 mg total) by mouth at bedtime., Disp: 30 tablet, Rfl: 3 °  fluticasone (FLONASE) 50 MCG/ACT nasal spray, SHAKE LIQUID AND USE 2 SPRAYS IN EACH NOSTRIL DAILY, Disp: 48 g, Rfl: 0 °  HYDROcodone bit-homatropine (HYCODAN) 5-1.5 MG/5ML syrup, Take 5 mLs by mouth every 6 (six) hours as needed for cough., Disp: 120 mL, Rfl: 0 °  influenza vaccine adjuvanted (FLUAD QUADRIVALENT) 0.5 ML injection, Inject into the muscle., Disp: 0.5 mL, Rfl: 0 °  levothyroxine (SYNTHROID, LEVOTHROID) 150 MCG tablet, Take 1 tablet (150 mcg total) by mouth daily before breakfast., Disp: 30 tablet, Rfl: 1 °  loratadine (CLARITIN) 10 MG tablet, Take 10 mg by mouth daily. , Disp: , Rfl:  °  Melatonin 10 MG TABS, Take 1 tablet by mouth at bedtime as needed., Disp: , Rfl:  °  mupirocin ointment (BACTROBAN) 2 %, Place 1 application into the nose 2 (two) times daily., Disp: 22  g, Rfl: 0 °  omega-3 acid ethyl esters (LOVAZA) 1 G capsule, Take 1 g by mouth 2 (two) times daily. , Disp: , Rfl:  °  Polyethyl Glycol-Propyl Glycol (SYSTANE OP), Place 1 drop into both eyes daily as needed (dry eyes). , Disp: , Rfl:  °  pregabalin (LYRICA) 50 MG capsule, Take 1 capsule (50 mg total) by mouth 2 (two) times daily., Disp: 60 capsule, Rfl: 5 °  Prenatal Vit-Fe Fumarate-FA (PRENATAL VITAMIN PO), Take 1 capsule by mouth daily. Takes prenatal because there are no dyes in it, Disp: , Rfl:  °  Probiotic Product (ALIGN PO), Take by mouth 2 (  two) times daily., Disp: , Rfl:  °  rifaximin (XIFAXAN) 550 MG TABS tablet, Take 550 mg by mouth See admin instructions. Take one tablet by mouth three times daily for 14 days when needed., Disp: , Rfl:  °  rosuvastatin (CRESTOR) 10 MG tablet, Take 10 mg by mouth every evening. , Disp: , Rfl:  °  Saccharomyces boulardii (FLORASTOR PO), Take 1 capsule by mouth daily., Disp: , Rfl:  °  Semaglutide, 1 MG/DOSE, 4 MG/3ML SOPN, Inject 1 mg as directed once a week., Disp: 9 mL, Rfl: 0 °  Tazarotene (ARAZLO) 0.045 % LOTN, Apply 1 application topically daily., Disp: 45 g, Rfl: 0 °  Tbo-Filgrastim (GRANIX) 480 MCG/0.8ML SOSY injection, 480 mcg subcutaneous injection every 6 days life long, Disp: 11.2 mL, Rfl: 11 °  tiZANidine (ZANAFLEX) 4 MG tablet, Take 0.5-1 tablets (2-4 mg total) by mouth every 8 (eight) hours as needed for muscle spasms., Disp: 60 tablet, Rfl: 1 ° °Review of Systems: °Pertinent positives includes increased reflux, difficulty digesting meats, palpitations, abdominal distention, pelvic pain, vaginal bleeding, ringing in ears, back pain, bruising/bleeding easily, rectal bleeding. °Denies appetite changes, fevers, chills, fatigue, unexplained weight changes. °Denies hearing loss, neck lumps or masses, mouth sores or voice changes. °Denies cough or wheezing.  Denies shortness of breath. °Denies chest pain. Denies leg swelling. °Denies abdominal pain, blood in  stools, constipation, diarrhea, nausea, vomiting, or early satiety. °Denies pain with intercourse, dysuria, frequency, hematuria or incontinence. °Denies hot flashes.   °Denies joint pain or muscle pain/cramps. °Denies itching, rash, or wounds. °Denies dizziness, headaches, numbness or seizures. °Denies swollen lymph nodes or glands. °Denies anxiety, depression, confusion, or decreased concentration. ° °Physical Exam: °BP 138/64 (BP Location: Left Arm, Patient Position: Sitting)    Pulse 65    Temp 98.3 °F (36.8 °C) (Oral)    Resp 18    Ht 5' 3.39" (1.61 m)    Wt 217 lb (98.4 kg)    SpO2 100%    BMI 37.97 kg/m²  °General: Alert, oriented, no acute distress. °HEENT: Normocephalic, atraumatic, sclera anicteric. °Chest: Clear to auscultation bilaterally.  No wheezes or rhonchi. °Cardiovascular: Regular rate and rhythm, no murmurs. °Abdomen: Obese, soft, nontender.  Normoactive bowel sounds.  Ostomy bag in each lower quadrant.  Around the left lower quadrant bag, there is significant erythema as well as skin breakdown noted.  Additionally, there are multiple healing wounds along the lower abdomen and left side of the mons that appear consistent with healing soft tissue infection sites. °Extremities: Grossly normal range of motion.  Warm, well perfused.  No edema bilaterally. °Lymphatics: No cervical, supraclavicular, or inguinal adenopathy. °GU: Approximately 1 x 1 cm mobile firm nodule in the upper aspect of the left vulva, near the left groin, approximately 5mm below the skin surface.  Mildly tender with palpation.  Rest of pelvic exam deferred. ° °Laboratory & Radiologic Studies: °None new ° °Assessment: °[name] is a 66 y.o. woman with history of recurrent endometrioid endometrial/ovarian cancer.   S/p exploratory laparotomy, posterior supralevator pelvic exenteration, end colostomy, bladder repair, ureteral stenting with complete resection and negative margins on 11/09/14. °S/p adjuvant radiation  completed 03/10/15. °Disease free since this time. ° °Postoperative and post-radiation right hydroureter and distal ureteral obstruction.  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 °  °Status post pelvic exenteration procedure at [facility] in September 2020 with proctectomy and cystectomy and formation of permanent colostomy and   ileal conduit.  Vaginectomy not performed. 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 No specific follow-up or surveillance recommended by genetics counselor. ° °Plan: °1) History of recurrent endometrial/ovarian cancer: s/p complete resection with negative margins and s/p adjuvant radiation. °No chemotherapy (in the recurrent setting) due to chronic idiopathic neutropenia. She has a history of primary adjuvant chemotherapy after her initial diagnosis of stage III endometrial cancer in 2006. °CT imaging and extensive pelvic surgical evaluation in 2020 demonstrated complete clinical response. Most recent imaging in early 2022 with no definitive evidence of recurrent disease. °Now with some new abdominal symptoms, will get CT to evaluate for evidence of recurrence as well as presacral fluid collection that continues to lead to vaginal/rectal bleeding and drainage.  °  °Will continue annual surveillance exams until 2026.  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 She has persistent vaginal drainage despite cystectomy and total proctectomy. °She patient continues to have vaginal and rectal bleeding. She's been previously counseled that in the setting of vagina having been left in situ at the time of her exenteration in 04/2019, surgery to performed total vaginectomy and placement of myocutaneous flap would require another major  surgery that would likely involve having her colostomy and ileal conduit having to be taken down and would be associated with significant morbidity.  Additionally there would be no guarantee that her tissues, severely damaged by radiation, would heal such as a second surgery and result in good outcomes. °Will plan CT to re-evaluate this presacral fluid collection. The patient is now on anti-coagulation and has increased bleeding. We discussed a couple of possible treatment consideration pending repeat evaluation with imaging. One would be discussion with IR to see if any possibility to embolize blood supply to this area. The other would be consideration of hyperbaric oxygen therapy is if there continues to be no evidence of disease recurrence.   ° °3) Left vulvar/groin lesion °Suspect that this is a lipoma or other benign lesion. The patient is more and more symptomatic from this lesion as it grows and would like to pursue excision. Given size and depth, I recommended that this be done as outpatient surgery. Will plan EUA at the time of excision.  ° °4) neutropenia - Dr [name] is managing this and we appreciate. Patient is on granix. Discussed with Brenda, Dr. Gorsuch's nurse, helping the patient with prior authorization.  °  °5) breast cancer - history of stage IIA. ER/PR positive. Treated with surgery and arimidex. 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 Patient follows-up with Dr Buccini for close colon cancer and upper GI cancer surveillance ° °7) Concern for soft tissue infection/chronic skin breakdown at site of colostomy. °Would benefit from meeting with ostomy team for any additional suggestions for skin protection. Will plan to treat with week of doxycycline as patient has noted significant improvement in skin condition with previous antibiotic therapy. °  °45 minutes of total time was spent for this patient encounter, including preparation, face-to-face counseling with the patient and  coordination of care, and documentation of the encounter. ° °Radhika Dershem, MD  °Division of Gynecologic Oncology  °Department of Obstetrics and Gynecology  °[facility]  ° °

## 2021-08-17 NOTE — Progress Notes (Incomplete)
Gynecologic Oncology Return Clinic Visit  $Remo'@DATE'NPvWV$ @  Reason for Visit: ***  Treatment History: Oncology History Overview Note  Breast cancer of upper-inner quadrant of left female breast [facility])   Staging form: Breast, AJCC 7th Edition     Clinical stage from 12/31/2015: Stage IIA (T2, N0, M0) - Signed by Heath Lark, MD on 12/31/2015  ER,PR positive Her2 neg   History of ovarian & endometrial cancer  10/15/2004 Initial Diagnosis   History of ovarian cancer, treated with chemotherapy carbo/Taxol   03/17/2009 Bone Marrow Biopsy   Bone marrow biopsy at [facility] showed neutropenia   01/23/2014 Bone Marrow Biopsy   Repeat bone marrow biopsy showed neutropenia   09/12/2014 Imaging   CT scan showed possible cancer   10/14/2014 Imaging   MRI show significant pelvic mass without invasion into the bladder but abutting to the rectum   11/19/2014 Surgery   she underwent surgery and had robotic-assisted lysis of adhesions, converted to laparotomy, radical upper vaginectomy and low anterior resection with colostomy. 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 Resection margins were negative   01/29/2015 - 03/10/2015 Radiation Therapy   She received adjuvant radiation   03/03/2015 Imaging   CT scan of the abdomen and pelvis showed status post interval removal of the bilateral nephroureteral stents. Worsening moderate right hydroureteronephrosis. Resolved left hydroureteronephrosis.   03/20/2015 Procedure   she had cystoscopy and stent placement for right hydronephrosis   05/02/2015 Surgery   Cystoscopy with right retrograde pyelogram interpretation. Diagnostic ureteroscopy. Removal of right ureteral stent.   05/14/2015 Imaging   CT scan of the abdomen and pelvis show unilateral right hydronephrosis with no  residual cancer   08/18/2015 Imaging   CT scan showed hysterectomy with stable presacral soft tissue thickening. No definitive evidence of recurrent or metastatic disease. Severe right hydronephrosis   09/12/2015 Surgery   Cystoscopy with right retrograde pyelogram interpretation. Right ureteral stent placement 5 x 24 Polaris, no tether   10/07/2016 Imaging   Stable exam.  No new or progressive findings. 2. Stable appearance abnormal presacral soft tissue compatible with post treatment change. 3. Internal right ureteral stent with persistent right hydroureteronephrosis. Differentially decreased perfusion of the right kidney suggests a component of underlying obstructive uropathy. 4. Stable 13 mm left adrenal nodule, previously characterized as adenoma.       01/27/2017 Imaging   Stable postop and post radiation changes in presacral region. No evidence of recurrent or metastatic carcinoma within the chest, abdomen, or pelvis. Stable mild to moderate right hydroureteronephrosis, with right ureteral stent in appropriate position. Stable small benign left adrenal adenoma and left thyroid lobe nodule.   10/02/2019 Tumor Marker   Patient's tumor was tested for the following markers: CA-125 Results of the tumor marker test revealed 5.8   Breast cancer of upper-inner quadrant of left female breast ([place])  10/14/2015 Imaging   Screening mammogram showed possible distortion in the left breast.   10/24/2015 Imaging   Targeted ultrasound is performed, showing a 0.6 x 0.8 x 0.9 cm area of hypoechoic distortion at the 10 o'clock position of the left breast 5 cm from the nipple   10/24/2015 Imaging   Diagnostic imaging confirmed 0.6 x 0.8 x 0.9 cm distortion in the upper inner left breast   10/30/2015 Procedure   Left US guided biopsy was performed   10/30/2015 Pathology Results  Accession: [id number] showed invasive ductal carcinoma, ER/PR positive, Her2 neg   11/05/2015 Genetic Testing   Genetic testing  did not reveal a deleterious mutation.  Genetic testing did detect a Variant of Unknown Significance in the MSH6 gene called c.389A>G..  Genes tested include: APC, ATM, AXIN2, BARD1, BMPR1A, BRCA1, BRCA2, BRIP1, CDH1, CDKN2A, CHEK2, DICER1, EPCAM, GREM1, KIT, MEN1, MLH1, MSH2, MSH6, MUTYH, NBN, NF1, PALB2, PDGFRA, PMS2, POLD1, POLE, PTEN, RAD50, RAD51C, RAD51D, SDHA, SDHB, SDHC, SDHD, SMAD4, SMARCA4. STK11, TP53, TSC1, TSC2, and VHL.  UPDATE: MSH6 c.389A>G (p.His130Arg) has been reclassified as Likely Benign.  The amended report date is February 25, 2021.   12/11/2015 Surgery   She undwerwent bilateral mastectomy, left SLN biopsy and immediate reconstruction surgeries with bilateral plasma expanders   12/11/2015 Pathology Results   Accession: [id number] mastectomy specimens showed left breast ca, pT2N0M0   12/26/2015 Pathology Results   Accession: [id number] specimen from debridement showed no cancer   12/26/2015 Surgery   she underwent surgical debridement of necrotic tissue at site of plasma expander   01/12/2016 Imaging   Bone density is normal   02/02/2016 -  Anti-estrogen oral therapy   She started taking Arimidex   10/21/2017 Procedure   Ultrasound guided biopsy of a vague hypoechoic shadowing lesion, generally corresponding to area palpable abnormality, along chest lateral to the reconstructed left breast and inferior to the left axilla. No apparent complications.   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+, HER2 negative/  12-11-2015 bilateral mastecotmy w/ reconstruction (no radiation and no chemo)   Cancer of corpus uteri,  except isthmus [facility])  oncologist-- dr Denman [name] and dr Alvy Bimler    10-15-2004  dx endometroid endometrial and ovarian cancer s/p  chemotheapy and surgery(TAH w/ BSO) :  recurrent 11-19-2014 post pelvic surgery and radiation 01-29-2015 to 03-10-2015   Chronic idiopathic neutropenia ([place])    presumed related to chemotherapy March 2006--- followed by dr Alvy Bimler (treatment w/ G-CSF injections   Chronic nausea    Chronic pain    perineal/ anal  area from bladder pad irritates skin , right flank pain   CKD stage G2/A3, GFR 60-89 and albumin creatinine ratio >300 mg/g    nephrologist-  dr Madelon Lips   Colovesical fistula    Diabetic retinopathy, background ([place])    Difficult intravenous access    small veins--- hx PICC lines   DM type 2 (diabetes mellitus, type 2) ([place])    monitored by dr Legrand Como altheimer   Dysuria    Environmental and seasonal allergies    Fatty liver 02/08/2016   CT   Generalized muscle weakness    GERD (gastroesophageal reflux disease)    Hiatal hernia    History of abdominal abscess 04/16/2017   post surgery 04-01-2017  --- resolved 10/ 2018   History of gastric polyp    2014  duodenum   History of ileus 04/16/2017   resolved w/ no surgical intervention   History of radiation therapy    01-29-2015 to 03-10-2015  pelvis 50.4Gy   Hypothyroidism    monitored by dr Legrand Como altheimer  IBS (irritable bowel syndrome)    Ileostomy in place [facility]) 04/01/2017   created at same time colostomy takedown.   Joint pain    Leg edema    Lower urinary tract symptoms (LUTS)    urge urinary  incontinence   Mixed dyslipidemia    Multiple thyroid nodules    Managed by Dr. Harlow Asa   Nephrostomy status [facility])    Palpitations    Pelvic abscess in female 04/16/2017   PONV (postoperative nausea and vomiting)    "scopolamine patch works for me"   Radiation-induced dermatitis    contact dermatitis , radiation completed, rash only on ankles now.   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   Vitamin D deficiency    Wears glasses     Past Surgical History:  Procedure Laterality Date   APPENDECTOMY     biopsy thyroid nodules     BREAST RECONSTRUCTION WITH PLACEMENT OF TISSUE EXPANDER AND FLEX HD (ACELLULAR HYDRATED DERMIS) Bilateral 12/11/2015   Procedure: BILATERAL BREAST RECONSTRUCTION WITH PLACEMENT OF TISSUE EXPANDERS;  Surgeon: Irene Limbo, MD;  Location: [place];  Service: Plastics;  Laterality: Bilateral;   COLONOSCOPY WITH PROPOFOL N/A 08/21/2013   Procedure: COLONOSCOPY WITH PROPOFOL;  Surgeon: Cleotis Nipper, MD;  [facility];  Service: Endoscopy;  Laterality: N/A;   COLOSTOMY TAKEDOWN N/A 12/04/2014   Procedure: LAPROSCOPIC LYSIS OF ADHESIONS, SPLENIC MOBILIZATION, RELOCATION OF COLOSTOMY, DEBRIDEMENT INITIAL COLOSTOMY SITE;  Surgeon: Michael [name], MD;  [facility];  Service: General;  Laterality: N/A;   CYSTOGRAM N/A 06/01/2017   Procedure: CYSTOGRAM;  Surgeon: Alexis Frock, MD;  [facility];  Service: Urology;  Laterality: N/A;   CYSTOSCOPY W/ RETROGRADES Right 11/21/2015   Procedure: CYSTOSCOPY WITH RETROGRADE PYELOGRAM;  Surgeon: Alexis Frock, MD;  [facility];  Service: Urology;  Laterality: Right;   CYSTOSCOPY W/ URETERAL STENT PLACEMENT Right 11/21/2015   Procedure: CYSTOSCOPY WITH STENT REPLACEMENT;  Surgeon: Alexis Frock, MD;  [facility];  Service: Urology;  Laterality: Right;   CYSTOSCOPY W/ URETERAL STENT PLACEMENT Right 03/10/2016   Procedure: CYSTOSCOPY WITH STENT REPLACEMENT;  Surgeon: Alexis Frock, MD;  Location: [facility];  Service: Urology;  Laterality: Right;   CYSTOSCOPY W/ URETERAL STENT PLACEMENT Right 06/30/2016   Procedure: CYSTOSCOPY WITH RETROGRADE PYELOGRAM/URETERAL STENT EXCHANGE;  Surgeon: Alexis Frock, MD;  Location:  [facility];  Service: Urology;  Laterality: Right;   CYSTOSCOPY W/ URETERAL STENT PLACEMENT N/A 06/01/2017   Procedure: CYSTOSCOPY WITH EXAM UNDER ANESTHESIA;  Surgeon: Alexis Frock, MD;  [facility];  Service: Urology;  Laterality: N/A;   CYSTOSCOPY W/ URETERAL STENT PLACEMENT Right 08/17/2017   Procedure: CYSTOSCOPY WITH RETROGRADE PYELOGRAM/URETERAL STENT REMOVAL;  Surgeon: Alexis Frock, MD;  Location: [facility];  Service: Urology;  Laterality: Right;   CYSTOSCOPY WITH RETROGRADE PYELOGRAM, URETEROSCOPY AND STENT PLACEMENT Right 03/20/2015   Procedure: CYSTOSCOPY WITH RETROGRADE PYELOGRAM, URETEROSCOPY WITH BALLOON DILATION AND STENT PLACEMENT ON RIGHT;  Surgeon: Alexis Frock, MD;  Location: [facility];  Service: Urology;  Laterality: Right;   CYSTOSCOPY WITH RETROGRADE PYELOGRAM, URETEROSCOPY AND STENT PLACEMENT Right 05/02/2015   Procedure: CYSTOSCOPY WITH RIGHT RETROGRADE PYELOGRAM,  DIAGNOSTIC URETEROSCOPY AND STENT PULL ;  Surgeon: Alexis Frock, MD;  Location: [facility];  Service: Urology;  Laterality: Right;  CYSTOSCOPY WITH RETROGRADE PYELOGRAM, URETEROSCOPY AND STENT PLACEMENT Right 09/05/2015   Procedure: CYSTOSCOPY WITH RETROGRADE PYELOGRAM,  AND STENT PLACEMENT;  Surgeon: Alexis Frock, MD;  [facility];  Service: Urology;  Laterality: Right;   CYSTOSCOPY WITH RETROGRADE PYELOGRAM, URETEROSCOPY AND STENT PLACEMENT Right 04/01/2017   Procedure: CYSTOSCOPY WITH RETROGRADE PYELOGRAM, URETEROSCOPY AND STENT PLACEMENT;  Surgeon: Alexis Frock, MD;  [facility];  Service: Urology;  Laterality: Right;   CYSTOSCOPY WITH STENT PLACEMENT Right 10/27/2016   Procedure: CYSTOSCOPY WITH STENT CHANGE and right retrograde pyelogram;  Surgeon: Alexis Frock, MD;  Location[facility];  Service: Urology;  Laterality: Right;   EUS N/A 10/02/2014   Procedure: LOWER ENDOSCOPIC ULTRASOUND (EUS);  Surgeon:  Arta Silence, MD;  Location[facility];  Service: Endoscopy;  Laterality: N/A;   EXCISION SOFT TISSUE MASS RIGHT FOREMAN  12-08-2006   EYE SURGERY  as child   pytosis of eyelids repair   INCISION AND DRAINAGE OF WOUND Bilateral 12/26/2015   Procedure: DEBRIDEMENT OF BILATERAL MASTECTOMY FLAPS;  Surgeon: Irene Limbo, MD;  Location: [place];  Service: Plastics;  Laterality: Bilateral;   IR CV LINE INJECTION  05/31/2017   IR FLUORO GUIDE CV LINE LEFT  05/31/2017   IR FLUORO GUIDE CV LINE RIGHT  04/06/2017   IR FLUORO GUIDE CV MIDLINE PICC RIGHT  05/30/2017   IR NEPHROSTOGRAM LEFT INITIAL PLACEMENT  09/02/2017   IR NEPHROSTOGRAM LEFT THRU EXISTING ACCESS  11/29/2017   IR NEPHROSTOGRAM RIGHT INITIAL PLACEMENT  09/02/2017   IR NEPHROSTOGRAM RIGHT THRU EXISTING ACCESS  09/13/2017   IR NEPHROSTOGRAM RIGHT THRU EXISTING ACCESS  11/29/2017   IR NEPHROSTOMY EXCHANGE LEFT  11/28/2017   IR NEPHROSTOMY EXCHANGE LEFT  01/05/2018   IR NEPHROSTOMY EXCHANGE LEFT  02/16/2018   IR NEPHROSTOMY EXCHANGE LEFT  03/30/2018   IR NEPHROSTOMY EXCHANGE LEFT  05/12/2018   IR NEPHROSTOMY EXCHANGE LEFT  06/21/2018   IR NEPHROSTOMY EXCHANGE LEFT  08/04/2018   IR NEPHROSTOMY EXCHANGE LEFT  09/18/2018   IR NEPHROSTOMY EXCHANGE LEFT  10/09/2018   IR NEPHROSTOMY EXCHANGE LEFT  10/27/2018   IR NEPHROSTOMY EXCHANGE LEFT  11/21/2018   IR NEPHROSTOMY EXCHANGE LEFT  01/05/2019   IR NEPHROSTOMY EXCHANGE LEFT  02/15/2019   IR NEPHROSTOMY EXCHANGE LEFT  03/29/2019   IR NEPHROSTOMY EXCHANGE RIGHT  10/02/2017   IR NEPHROSTOMY EXCHANGE RIGHT  11/28/2017   IR NEPHROSTOMY EXCHANGE RIGHT  01/05/2018   IR NEPHROSTOMY EXCHANGE RIGHT  02/16/2018   IR NEPHROSTOMY EXCHANGE RIGHT  03/30/2018   IR NEPHROSTOMY EXCHANGE RIGHT  05/12/2018   IR NEPHROSTOMY EXCHANGE RIGHT  06/21/2018   IR NEPHROSTOMY EXCHANGE RIGHT  08/04/2018   IR NEPHROSTOMY EXCHANGE RIGHT  09/18/2018   IR NEPHROSTOMY EXCHANGE RIGHT  10/27/2018   IR NEPHROSTOMY EXCHANGE RIGHT   11/21/2018   IR NEPHROSTOMY EXCHANGE RIGHT  01/05/2019   IR NEPHROSTOMY EXCHANGE RIGHT  02/15/2019   IR NEPHROSTOMY EXCHANGE RIGHT  03/29/2019   IR NEPHROSTOMY PLACEMENT LEFT  10/02/2017   IR RADIOLOGIST EVAL & MGMT  05/03/2017   IR US GUIDE VASC ACCESS LEFT  05/31/2017   IR US GUIDE VASC ACCESS RIGHT  04/06/2017   IR US GUIDE VASC ACCESS RIGHT  05/30/2017   LAPAROSCOPIC CHOLECYSTECTOMY  1990   LIPOSUCTION WITH LIPOFILLING Bilateral 04/16/2016   Procedure: LIPOSUCTION WITH LIPOFILLING TO BILATERAL CHEST;  Surgeon: Irene Limbo, MD;  Location: [employer];  Service: Plastics;  Laterality: Bilateral;   MASTECTOMY W/ SENTINEL NODE BIOPSY  Bilateral 12/11/2015   Procedure: RIGHT PROPHYLACTIC MASTECTOMY, LEFT TOTAL MASTECTOMY WITH LEFT AXILLARY SENTINEL LYMPH NODE BIOPSY;  Surgeon: Stark Klein, MD;  Location: [place];  Service: General;  Laterality: Bilateral;   OSTOMY N/A 11/19/2014   Procedure: OSTOMY;  Surgeon: Michael [name], MD;  [facility];  Service: General;  Laterality: N/A;   PROCTOSCOPY N/A 04/01/2017   Procedure: RIDGE PROCTOSCOPY;  Surgeon: Michael [name], MD;  [facility];  Service: General;  Laterality: N/A;   REMOVAL OF BILATERAL TISSUE EXPANDERS WITH PLACEMENT OF BILATERAL BREAST IMPLANTS Bilateral 04/16/2016   Procedure: REMOVAL OF BILATERAL TISSUE EXPANDERS WITH PLACEMENT OF BILATERAL BREAST IMPLANTS;  Surgeon: Irene Limbo, MD;  Location: [place];  Service: Plastics;  Laterality: Bilateral;   ROBOTIC ASSISTED LAP VAGINAL HYSTERECTOMY N/A 11/19/2014   Procedure: ROBOTIC LYSIS OF ADHESIONS, CONVERTED TO LAPAROTOMY RADICAL UPPER VAGINECTOMY,LOW ANTERIOR BOWEL RESECTION, COLOSTOMY, BILATERAL URETERAL STENT PLACEMENT AND CYSTONOMY CLOSURE;  Surgeon: Everitt Amber, MD;  [facility];  Service: Gynecology;  Laterality: N/A;   TISSUE EXPANDER FILLING Bilateral 12/26/2015   Procedure: EXPANSION OF BILATERAL CHEST TISSUE EXPANDERS (60 mL- Right; 75 mL- Left);   Surgeon: Irene Limbo, MD;  Location: [place];  Service: Plastics;  Laterality: Bilateral;   TONSILLECTOMY     TOTAL ABDOMINAL HYSTERECTOMY  March 2006   [facility]   and Bilateral Salpingoophorectomy/  staging for Ovarian cancer/  an   XI ROBOTIC ASSISTED LOWER ANTERIOR RESECTION N/A 04/01/2017   Procedure: XI ROBOTIC VS LAPAROSCOPIC COLOSTOMY TAKEDOWN WITH LYSIS OF ADHESIONS.;  Surgeon: Michael [name], MD;  [facility];  Service: General;  Laterality: N/A;  ERAS PATHWAY    Family History  Problem Relation Age of Onset   Cancer Mother 75       stomach ca   Hypertension Mother    Cancer Father 46       prostate ca   Diabetes Father    Heart disease Father        CABG   Hypertension Father    Hyperlipidemia Father    Obesity Father    Breast cancer Maternal Aunt        dx in her 59s   Lymphoma Paternal Aunt    Brain cancer Paternal Grandfather    Ovarian cancer Other    Diabetes Sister    Hypertension Brother y-10   Heart disease Brother        CABG   Diabetes Brother     Social History   Socioeconomic History   Marital status: Married    Spouse name: Armed forces technical officer   Number of children: 1   Years of education: Not on file   Highest education level: Not on file  Occupational History   Occupation: retired Therapist, 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, sports - retired  Tobacco Use   Smoking status: Never   Smokeless tobacco: Never  Vaping Use   Vaping Use: Never used  Substance and Sexual Activity   Alcohol use: Not Currently   Drug use: No   Sexual activity: Not on file  Other Topics Concern   Not on file  Social History Narrative   Exercise-- has not gotten back into it since cancer came back   Social Determinants of Health   Financial Resource Strain: Not on file  Food Insecurity: Not on file  Transportation Needs: Not on file  Physical Activity: Not on file  Stress: Not on file  Social Connections: Not on file  Current Medications:  Current  Outpatient Medications:    acetaminophen (TYLENOL) 325 MG tablet, Take 2 tablets (650 mg total) by mouth every 6 (six) hours as needed., Disp: 30 tablet, Rfl: 1   albuterol (PROVENTIL HFA) 108 (90 Base) MCG/ACT inhaler, Inhale 1 puff into the lungs every 6 (six) hours as needed for wheezing or shortness of breath., Disp: 18 g, Rfl: 2   apixaban (ELIQUIS) 5 MG TABS tablet, Take 1 tablet (5 mg total) by mouth 2 (two) times daily., Disp: 180 tablet, Rfl: 1   benzonatate (TESSALON) 100 MG capsule, Take 1 capsule (100 mg total) by mouth 3 (three) times daily as needed for cough., Disp: 30 capsule, Rfl: 0   Biotin 5 MG TABS, Take 5 mg by mouth daily. , Disp: , Rfl:    buPROPion (WELLBUTRIN SR) 150 MG 12 hr tablet, Take 1 tablet (150 mg total) by mouth 2 (two) times daily., Disp: 180 tablet, Rfl: 0   calcium citrate-vitamin D (CITRACAL+D) 315-200 MG-UNIT tablet, Take 1 tablet by mouth daily., Disp: , Rfl:    Cholecalciferol (VITAMIN D3) 10000 UNITS capsule, Take 10,000 Units by mouth See admin instructions. Take 1 tablet by mouth 4 times per week., Disp: , Rfl:    clobetasol (OLUX) 0.05 % topical foam, Apply topically 2 (two) times daily., Disp: 50 g, Rfl: 3   diphenhydrAMINE (BENADRYL) 25 mg capsule, Take 1 capsule (25 mg total) by mouth every 8 (eight) hours as needed for itching, allergies or sleep., Disp: 30 capsule, Rfl: 0   famotidine-calcium carbonate-magnesium hydroxide (PEPCID COMPLETE) 10-800-165 MG chewable tablet, Chew by mouth as needed., Disp: , Rfl:    ferrous sulfate 325 (65 FE) MG tablet, Take 1 tablet (325 mg total) by mouth at bedtime., Disp: 30 tablet, Rfl: 3   fluticasone (FLONASE) 50 MCG/ACT nasal spray, SHAKE LIQUID AND USE 2 SPRAYS IN EACH NOSTRIL DAILY, Disp: 48 g, Rfl: 0   HYDROcodone bit-homatropine (HYCODAN) 5-1.5 MG/5ML syrup, Take 5 mLs by mouth every 6 (six) hours as needed for cough., Disp: 120 mL, Rfl: 0   influenza vaccine adjuvanted (FLUAD QUADRIVALENT) 0.5 ML injection,  Inject into the muscle., Disp: 0.5 mL, Rfl: 0   levothyroxine (SYNTHROID, LEVOTHROID) 150 MCG tablet, Take 1 tablet (150 mcg total) by mouth daily before breakfast., Disp: 30 tablet, Rfl: 1   loratadine (CLARITIN) 10 MG tablet, Take 10 mg by mouth daily. , Disp: , Rfl:    Melatonin 10 MG TABS, Take 1 tablet by mouth at bedtime as needed., Disp: , Rfl:    mupirocin ointment (BACTROBAN) 2 %, Place 1 application into the nose 2 (two) times daily., Disp: 22 g, Rfl: 0   omega-3 acid ethyl esters (LOVAZA) 1 G capsule, Take 1 g by mouth 2 (two) times daily. , Disp: , Rfl:    Polyethyl Glycol-Propyl Glycol (SYSTANE OP), Place 1 drop into both eyes daily as needed (dry eyes). , Disp: , Rfl:    pregabalin (LYRICA) 50 MG capsule, Take 1 capsule (50 mg total) by mouth 2 (two) times daily., Disp: 60 capsule, Rfl: 5   Prenatal Vit-Fe Fumarate-FA (PRENATAL VITAMIN PO), Take 1 capsule by mouth daily. Takes prenatal because there are no dyes in it, Disp: , Rfl:    Probiotic Product (ALIGN PO), Take by mouth 2 (two) times daily., Disp: , Rfl:    rifaximin (XIFAXAN) 550 MG TABS tablet, Take 550 mg by mouth See admin instructions. Take one tablet by mouth three times daily for 14 days  when needed., Disp: , Rfl:    rosuvastatin (CRESTOR) 10 MG tablet, Take 10 mg by mouth every evening. , Disp: , Rfl:    Saccharomyces boulardii (FLORASTOR PO), Take 1 capsule by mouth daily., Disp: , Rfl:    Semaglutide, 1 MG/DOSE, 4 MG/3ML SOPN, Inject 1 mg as directed once a week., Disp: 9 mL, Rfl: 0   Tazarotene (ARAZLO) 0.045 % LOTN, Apply 1 application topically daily., Disp: 45 g, Rfl: 0   Tbo-Filgrastim (GRANIX) 480 MCG/0.8ML SOSY injection, 480 mcg subcutaneous injection every 6 days life long, Disp: 11.2 mL, Rfl: 11   tiZANidine (ZANAFLEX) 4 MG tablet, Take 0.5-1 tablets (2-4 mg total) by mouth every 8 (eight) hours as needed for muscle spasms., Disp: 60 tablet, Rfl: 1  Review of Systems: Denies appetite changes, fevers,  chills, fatigue, unexplained weight changes. Denies hearing loss, neck lumps or masses, mouth sores, ringing in ears or voice changes. Denies cough or wheezing.  Denies shortness of breath. Denies chest pain or palpitations. Denies leg swelling. Denies abdominal distention, pain, blood in stools, constipation, diarrhea, nausea, vomiting, or early satiety. Denies pain with intercourse, dysuria, frequency, hematuria or incontinence. Denies hot flashes, pelvic pain, vaginal bleeding or vaginal discharge.   Denies joint pain, back pain or muscle pain/cramps. Denies itching, rash, or wounds. Denies dizziness, headaches, numbness or seizures. Denies swollen lymph nodes or glands, denies easy bruising or bleeding. Denies anxiety, depression, confusion, or decreased concentration.  Physical Exam: BP 138/64 (BP Location: Left Arm, Patient Position: Sitting)    Pulse 65    Temp 98.3 F (36.8 C) (Oral)    Resp 18    Ht 5' 3.39" (1.61 m)    Wt 217 lb (98.4 kg)    SpO2 100%    BMI 37.97 kg/m  General: ***Alert, oriented, no acute distress. HEENT: ***Posterior oropharynx clear, sclera anicteric. Chest: ***Clear to auscultation bilaterally.  ***Port site clean. Cardiovascular: ***Regular rate and rhythm, no murmurs. Abdomen: ***Obese, soft, nontender.  Normoactive bowel sounds.  No masses or hepatosplenomegaly appreciated.  ***Well-healed scar. Extremities: ***Grossly normal range of motion.  Warm, well perfused.  No edema bilaterally. Skin: ***No rashes or lesions noted. Lymphatics: ***No cervical, supraclavicular, or inguinal adenopathy. GU: Normal appearing external genitalia without erythema, excoriation, or lesions.  Speculum exam reveals ***.  Bimanual exam reveals ***.  ***Rectovaginal exam  confirms ___.  Laboratory & Radiologic Studies: ***  Assessment & Plan: Letanya Froh is a 66 y.o. woman with Stage *** who presents for ***.  ***  *** minutes of total time was spent for this  patient encounter, including preparation, face-to-face counseling with the patient and coordination of care, and documentation of the encounter.  Jeral Pinch, MD  Division of Gynecologic Oncology  Department of Obstetrics and Gynecology  [facility]

## 2021-08-17 NOTE — H&P (View-Only) (Signed)
Gynecologic Oncology Return Clinic Visit  08/17/2021  Reason for Visit: Reestablishing care in the setting of a complex history of both endometrial and ovarian cancer  Treatment History: Oncology History Overview Note  Breast cancer of upper-inner quadrant of left female breast [facility])   Staging form: Breast, AJCC 7th Edition     Clinical stage from 12/31/2015: Stage IIA (T2, N0, M0) - Signed by Heath Lark, MD on 12/31/2015  ER,PR positive Her2 neg   History of ovarian & endometrial cancer  10/15/2004 Initial Diagnosis   History of ovarian cancer, treated with chemotherapy carbo/Taxol   03/17/2009 Bone Marrow Biopsy   Bone marrow biopsy at [facility] showed neutropenia   01/23/2014 Bone Marrow Biopsy   Repeat bone marrow biopsy showed neutropenia   09/12/2014 Imaging   CT scan showed possible cancer   10/14/2014 Imaging   MRI show significant pelvic mass without invasion into the bladder but abutting to the rectum   11/19/2014 Surgery   she underwent surgery and had robotic-assisted lysis of adhesions, converted to laparotomy, radical upper vaginectomy and low anterior resection with colostomy. 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 Resection margins were negative   01/29/2015 - 03/10/2015 Radiation Therapy   She received adjuvant radiation   03/03/2015 Imaging   CT scan of the abdomen and pelvis showed status post interval removal of the bilateral nephroureteral stents. Worsening moderate right hydroureteronephrosis. Resolved left hydroureteronephrosis.   03/20/2015 Procedure   she had cystoscopy and stent placement for right hydronephrosis   05/02/2015 Surgery   Cystoscopy with right retrograde pyelogram interpretation. Diagnostic ureteroscopy. Removal of right ureteral stent.    05/14/2015 Imaging   CT scan of the abdomen and pelvis show unilateral right hydronephrosis with no residual cancer   08/18/2015 Imaging   CT scan showed hysterectomy with stable presacral soft tissue thickening. No definitive evidence of recurrent or metastatic disease. Severe right hydronephrosis   09/12/2015 Surgery   Cystoscopy with right retrograde pyelogram interpretation. Right ureteral stent placement 5 x 24 Polaris, no tether   10/07/2016 Imaging   Stable exam.  No new or progressive findings. 2. Stable appearance abnormal presacral soft tissue compatible with post treatment change. 3. Internal right ureteral stent with persistent right hydroureteronephrosis. Differentially decreased perfusion of the right kidney suggests a component of underlying obstructive uropathy. 4. Stable 13 mm left adrenal nodule, previously characterized as adenoma.       01/27/2017 Imaging   Stable postop and post radiation changes in presacral region. No evidence of recurrent or metastatic carcinoma within the chest, abdomen, or pelvis. Stable mild to moderate right hydroureteronephrosis, with right ureteral stent in appropriate position. Stable small benign left adrenal adenoma and left thyroid lobe nodule.   10/02/2019 Tumor Marker   Patient's tumor was tested for the following markers: CA-125 Results of the tumor marker test revealed 5.8   Breast cancer of upper-inner quadrant of left female breast ([place])  10/14/2015 Imaging   Screening mammogram showed possible distortion in the left breast.   10/24/2015 Imaging   Targeted ultrasound is performed, showing a 0.6 x 0.8 x 0.9 cm area of hypoechoic distortion at the 10 o'clock position of the left breast 5 cm from the nipple   10/24/2015 Imaging   Diagnostic imaging confirmed 0.6 x 0.8 x 0.9 cm distortion in the upper inner left breast   10/30/2015 Procedure  Left US guided biopsy was performed   10/30/2015 Pathology Results   Accession: [id number] showed  invasive ductal carcinoma, ER/PR positive, Her2 neg   11/05/2015 Genetic Testing   Genetic testing did not reveal a deleterious mutation.  Genetic testing did detect a Variant of Unknown Significance in the MSH6 gene called c.389A>G..  Genes tested include: APC, ATM, AXIN2, BARD1, BMPR1A, BRCA1, BRCA2, BRIP1, CDH1, CDKN2A, CHEK2, DICER1, EPCAM, GREM1, KIT, MEN1, MLH1, MSH2, MSH6, MUTYH, NBN, NF1, PALB2, PDGFRA, PMS2, POLD1, POLE, PTEN, RAD50, RAD51C, RAD51D, SDHA, SDHB, SDHC, SDHD, SMAD4, SMARCA4. STK11, TP53, TSC1, TSC2, and VHL.  UPDATE: MSH6 c.389A>G (p.His130Arg) has been reclassified as Likely Benign.  The amended report date is February 25, 2021.   12/11/2015 Surgery   She undwerwent bilateral mastectomy, left SLN biopsy and immediate reconstruction surgeries with bilateral plasma expanders   12/11/2015 Pathology Results   Accession: [id number] mastectomy specimens showed left breast ca, pT2N0M0   12/26/2015 Pathology Results   Accession: [id number] specimen from debridement showed no cancer   12/26/2015 Surgery   she underwent surgical debridement of necrotic tissue at site of plasma expander   01/12/2016 Imaging   Bone density is normal   02/02/2016 -  Anti-estrogen oral therapy   She started taking Arimidex   10/21/2017 Procedure   Ultrasound guided biopsy of a vague hypoechoic shadowing lesion, generally corresponding to area palpable abnormality, along chest lateral to the reconstructed left breast and inferior to the left axilla. No apparent complications.   01/17/2018 Imaging   Bone density scan is normal    [name] is a 66 y.o. 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  She has a history of endometrial and ovarian endometrioid carcinoma treated in 2006 by Dr. [name] at [facility] in [place]. Her surgery (TAH, BSO) was followed by  adjuvant chemotherapy with carboplatin plus paclitaxel due to the ovarian involvement and the presence of a cul de sac lesion also positive for disease. She denies receiving adjuvant radiation therapy. She is unclear if she had metastatic endometrial cancer to the ovary or duel primaries. She had a complete response to therapy however developed leukopenia in July 2010. 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 She sees Dr. Alvy Bimler for this and is treated with G-CSF injections.   She began experiencing rectal pain approximately in January 2016. She also reports narrowing of caliber of the stool. She denies hematochezia. She does report approximately 3 months of vaginal spotting. She's had no specific follow-up for her gynecologic cancers in the past 4 years.   As part of workup of her rectal pain she underwent a CT scan of the abdomen and pelvis on 09/12/2014. This demonstrated a new right perirectal mass abutting the vaginal cuff measuring 3.8 x 4.9 cm. There is a limited fat plane between the mass and the rectum posteriorly. Rectal invasion could not be excluded. There were no other masses identified in the abdomen and pelvis or lymphadenopathy. There is no other evidence of metastatic disease or recurrent disease. There was no hydronephrosis. A CA-125 drawn on 09/12/2014 was normal at 10.   PET was negative for extrapelvic disease. MRI defined the lesion as a 3.5x5cm lesion to the right of the rectum at the vaginal cuff.   Colonoscopy was performed on 10/02/14 with transrectal [place] and biopsy and this revealed  endometrioid adenocarcinoma. Of note, the lesion was not seen within the lumen of the rectum.    She then underwent a posterior supralevator exenteration with colostomy and bladder repair and stent placement on 9/67/89 without complications with Dr Denman [name] and Dr Johney Maine.  Her postoperative  course was uncomplicate with the exception of development of postop anemia.  Her final pathology revealed endometrioid adenocarcinoma invading the vagina and rectum with negative margins on the specimen. The margin had been close (clinically) to the right pelvic sidewall which was marked with surgical clips.   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 It was treated with IV antibiotics and then on POD 15 she was taken to the OR for laparoscopic revision of the stoma with Dr Michael [name]. Postoperatively she had wound vac and packing for her stomal wound.   A retrograde cystogram on week 4 postop confirmed an intact bladder and the foley was removed.   She initially had some voiding issues with decreased sensation to void. We tested a post void residual in May, 2016 and this revealed adequate voiding.   On 12/30/14 she was admitted to [facility] with sepsis associated with Enterobacter Cloacae UTI. This was treated with IV antibiotics and then a prolonged course of oral cipro. Imaging performed at the time of admission (a CT of the abdo/pelvis) revealed: Bilateral double-J internal ureteral stents in adequate position with persistent mild bilateral hydronephrosis    She complete radiation therapy from 01/29/15 to 03/10/15 with 50Gy of external beam radiation and IMRT. She tolerated therapy well with minor skin irritation.   She had her ureteral stents removed in June, 2016, however, then developed right ureteral obstruction, hydroureter and pain. She went to the OR with Urologist Dr Tresa Moore on 03/20/15 for ureteral dilation and right stent placement (the left was draining well). No tumor was seen on cysto.    CT abdo/pelvis on 05/14/15 showed: no new lesions, stable thickening in right pelvis/distal right ureter consistent with radiation effect. The right ureteral stent was removed.   The patient's CT in January 2017 showed  progression of her right ureteral obstruction after stent removal. Dr Tresa Moore took her to the OR on 09/05/15 for a cystoscopy, retrograde pyelogram and right ureteral stent placement.    The CT on 08/18/15 showed decreased attenuation of the right renal parenchyma, right hydronephrosis that was severe no excretion of contrast on that for graphic phase imaging. The perivascular soft tissue thickening in the pelvis and presacral regions grossly stable consistent with radiation changes.   The patient was diagnosed with ER/PR positive left breast cancer in April 2017. Surgery is scheduled for May. She underwent bilateral mastectomy with expander reconstruction. Postoperatively she was treated with Arimidex.    Repeat CT imaging on 02/08/16 revealed : Stable post treatment changes in presacral region. No evidence of recurrent or metastatic carcinoma within the abdomen or pelvis. Interval placement of right ureteral stent in appropriate position. Moderate right hydroureteronephrosis remains stable. Stable benign left adrenal adenoma and hepatic steatosis.   On 06/30/16 she had replacement of her right ureteral stent with Dr Tresa Moore.   On 10/07/16 CT surveillance was performed which showed: Stable exam.  No new or progressive findings. Stable appearance abnormal presacral soft tissue compatible with post treatment change. Internal right ureteral stent with persistent right hydroureteronephrosis. Differentially decreased perfusion of the right kidney suggests a component of underlying obstructive uropathy. Stable 13 mm left adrenal nodule,  previously characterized as adenoma. A parastomal hernia was noted.   CT 01/27/17 showed stable findings with no evidence of recurrence. Persistent right hydroureter was again noted.   Her colostomy bothered her greatly and she hoped to be free of stomal appliances. She was frustrated with repetitive stent replacements and desired definitive reconstruction/reimplantation of the right  ureter.     Given that a 2 year disease free interval had elapsed, in 04/01/17 Dr Tresa Moore and Dr Johney Maine returned to the operating room with Ms Tarnow for a right ureteral resection, and bladder hitch with reimplantation and reversal of hartman's with temporary ileostomy complicated by postop development of urinoma which required a peritoneal drain and right ureteral stent.   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 She was offered permanent diversion with ileal conduit and permanent end colostomy. This was not considered a reasonable option at that time by [facility]. She became quite depressed by the slow recovery and sequelae of her last surgery. She sought second opinion with Gynecologic Oncology at [facility]. They offered referral to urology and colorectal specialists at [facility]. They recommended no additional cancer treatment as she was disease free. She is being worked up by them for possible laparotomy, revision of right uretero-neocystotomy, placement of VRAM flap, and possible bowel resection and revision of ileostomy. Prior to embarking on this surgery they requested she lose weight to a BMI of <30kg/m2.  She had lost weight to a BMI <30 throughout 2020. She experienced some episodes of passage of bloody mucous from the vagina in May, 2020 and so an CT scan was performed which showed no signs of pelvic recurrence.    In September the patient embarked on a large procedure at [facility].  She underwent an exploratory laparotomy panniculectomy cystectomy creation of urinary conduit with ileum, proctectomy, and formation of permanent and colostomy.  The procedure was uncomplicated.  She reported that while plastic surgery had planned to take part in the procedure with placement of a flap, it was determined that this was not necessary intraoperatively.  While the procedure  note in Care Everywhere states that a hysterectomy was performed, this had already been performed more than a decade prior.  Additionally there was no myocutaneous flap placed despite the procedure reporting as such.  No malignancy was identified in the specimens.   The patient had an uncomplicated procedure overall and a 7-day hospital stay with subsequent rehab.  She developed some wound healing issues at her colostomy site.   She continues to have persistent vaginal drainage which she had hoped would go away permanently after the procedure.  She was treated with an oral antibiotic recently by Dr Cristina Gong to control gas production in her stoma. It was beneficial for this but also for her vaginal drainage. After she stopped the course of antibiotic the discharge picked up again. At her last visit I had counseled her about usage of a tampon to absorb the drainage. She elected to not do this because she was worried about damaging something or stopping the drainage from coming out and causing infection.  Interval History: The patient was last seen in our clinic by Dr. Denman [name] in June 2021.  In the setting of continued vaginal bleeding and drainage, she sought consultation at [facility] with Dr. Fransisca Connors.  On exam, vagina noted to be atrophic.  Blood was seen from the apex of the vaginal vault and on  bimanual exam the vault and apex extended posteriorly towards the sacrum without an obvious defect.  Given some concern for possibility of osteomyelitis this driving this drainage, MRI was performed.  MRI on 09/27/2020 showed a 2.3 x 1.9 x 3.3 cm fluid collection that appears contiguous with the rectal pouch via thickened fluid-filled tract and questionable thin fistulous tract anteriorly to the vaginal cuff.  Marked presacral soft tissue thickening and stranding likely reflecting posttreatment changes noted.  No definite evidence of local recurrence.  Endorses continued vaginal and rectal bleeding, requires several  depens changes daily.  She continues to be quite bothered by this drainage.  Feels like things worsened after her consultation at [facility] and the exam at that visit.  She has subsequently been diagnosed with atrial fibrillation and she was started on Eliquis.  She is taking this only once a day instead of twice a day because she was seeing increase in her vaginal rectal bleeding as well as bruising.  She is noticed a white bump in her left groin area that is growing, causing her pain and discomfort related to the clothing she is wearing.  She is very concerned about this and would like it removed.  She continues to struggle with her ostomy pouch on the left.  She thinks that she has an allergy to adhesive although does not have the same issue with her urostomy pouch.  Is wearing a ostomy belt at night to help take some of the tension off her skin.  This is causing increased pain related to her right hip and back pain.  She also notes more recent symptoms of feeling bloated and gassy, both of which are new for her.  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+, HER2 negative/  12-11-2015 bilateral mastecotmy w/ reconstruction (no radiation and no chemo)   Cancer of corpus uteri, except isthmus [facility])  oncologist-- dr Denman [name] and dr Alvy Bimler    10-15-2004  dx endometroid endometrial and ovarian cancer s/p  chemotheapy and surgery(TAH w/ BSO) :  recurrent 11-19-2014 post pelvic surgery and radiation 01-29-2015 to 03-10-2015   Chronic idiopathic neutropenia ([place])    presumed related to chemotherapy March 2006--- followed by dr Alvy Bimler (treatment w/ G-CSF injections   Chronic nausea    Chronic pain    perineal/ anal  area from  bladder pad irritates skin , right flank pain   CKD stage G2/A3, GFR 60-89 and albumin creatinine ratio >300 mg/g    nephrologist-  dr Madelon Lips   Colovesical fistula    Diabetic retinopathy, background ([place])    Difficult intravenous access    small veins--- hx PICC lines   DM type 2 (diabetes mellitus, type 2) ([place])    monitored by dr Legrand Como altheimer   Dysuria    Environmental and seasonal allergies    Fatty liver 02/08/2016   CT   Generalized muscle weakness    GERD (gastroesophageal reflux disease)    Hiatal hernia    History of abdominal abscess 04/16/2017   post surgery 04-01-2017  --- resolved 10/ 2018   History of gastric polyp    2014  duodenum   History of ileus 04/16/2017  resolved w/ no surgical intervention   History of radiation therapy    01-29-2015 to 03-10-2015  pelvis 50.4Gy   Hypothyroidism    monitored by dr Legrand Como altheimer   IBS (irritable bowel syndrome)    Ileostomy in place [facility]) 04/01/2017   created at same time colostomy takedown.   Joint pain    Leg edema    Lower urinary tract symptoms (LUTS)    urge urinary  incontinence   Mixed dyslipidemia    Multiple thyroid nodules    Managed by Dr. Harlow Asa   Nephrostomy status [facility])    Palpitations    Pelvic abscess in female 04/16/2017   PONV (postoperative nausea and vomiting)    "scopolamine patch works for me"   Radiation-induced dermatitis    contact dermatitis , radiation completed, rash only on ankles now.   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   Vitamin D deficiency    Wears glasses     Past Surgical History:  Procedure Laterality Date   APPENDECTOMY     biopsy thyroid nodules     BREAST RECONSTRUCTION WITH PLACEMENT OF TISSUE EXPANDER AND FLEX HD (ACELLULAR HYDRATED DERMIS) Bilateral 12/11/2015   Procedure: BILATERAL  BREAST RECONSTRUCTION WITH PLACEMENT OF TISSUE EXPANDERS;  Surgeon: Irene Limbo, MD;  Location: [facility];  Service: Plastics;  Laterality: Bilateral;   COLONOSCOPY WITH PROPOFOL N/A 08/21/2013   Procedure: COLONOSCOPY WITH PROPOFOL;  Surgeon: Cleotis Nipper, MD;  Location: [facility];  Service: Endoscopy;  Laterality: N/A;   COLOSTOMY TAKEDOWN N/A 12/04/2014   Procedure: LAPROSCOPIC LYSIS OF ADHESIONS, SPLENIC MOBILIZATION, RELOCATION OF COLOSTOMY, DEBRIDEMENT INITIAL COLOSTOMY SITE;  Surgeon: Michael [name], MD;  [facility]: [facility];  Service: General;  Laterality: N/A;   CYSTOGRAM N/A 06/01/2017   Procedure: CYSTOGRAM;  Surgeon: Alexis Frock, MD;  [facility];  Service: Urology;  Laterality: N/A;   CYSTOSCOPY W/ RETROGRADES Right 11/21/2015   Procedure: CYSTOSCOPY WITH RETROGRADE PYELOGRAM;  Surgeon: Alexis Frock, MD;  [facility];  Service: Urology;  Laterality: Right;   CYSTOSCOPY W/ URETERAL STENT PLACEMENT Right 11/21/2015   Procedure: CYSTOSCOPY WITH STENT REPLACEMENT;  Surgeon: Alexis Frock, MD;  [facility];  Service: Urology;  Laterality: Right;   CYSTOSCOPY W/ URETERAL STENT PLACEMENT Right 03/10/2016   Procedure: CYSTOSCOPY WITH STENT REPLACEMENT;  Surgeon: Alexis Frock, MD;  Location: [facility];  Service: Urology;  Laterality: Right;   CYSTOSCOPY W/ URETERAL STENT PLACEMENT Right 06/30/2016   Procedure: CYSTOSCOPY WITH RETROGRADE PYELOGRAM/URETERAL STENT EXCHANGE;  Surgeon: Alexis Frock, MD;  Location: [facility];  Service: Urology;  Laterality: Right;   CYSTOSCOPY W/ URETERAL STENT PLACEMENT N/A 06/01/2017   Procedure: CYSTOSCOPY WITH EXAM UNDER ANESTHESIA;  Surgeon: Alexis Frock, MD;  [facility];  Service: Urology;  Laterality: N/A;   CYSTOSCOPY W/ URETERAL STENT PLACEMENT Right 08/17/2017   Procedure: CYSTOSCOPY WITH RETROGRADE PYELOGRAM/URETERAL STENT REMOVAL;  Surgeon: Alexis Frock, MD;  Location: [facility];  Service: Urology;  Laterality: Right;   CYSTOSCOPY WITH RETROGRADE PYELOGRAM, URETEROSCOPY AND STENT PLACEMENT Right 03/20/2015   Procedure: CYSTOSCOPY WITH RETROGRADE PYELOGRAM, URETEROSCOPY WITH BALLOON DILATION AND STENT PLACEMENT ON RIGHT;  Surgeon: Alexis Frock, MD;  Location: [facility];  Service: Urology;  Laterality: Right;   CYSTOSCOPY WITH RETROGRADE PYELOGRAM, URETEROSCOPY AND STENT PLACEMENT Right 05/02/2015  Procedure: CYSTOSCOPY WITH RIGHT RETROGRADE PYELOGRAM,  DIAGNOSTIC URETEROSCOPY AND STENT PULL ;  Surgeon: Alexis Frock, MD;  Location: [facility];  Service: Urology;  Laterality: Right;   CYSTOSCOPY WITH RETROGRADE PYELOGRAM, URETEROSCOPY AND STENT PLACEMENT Right 09/05/2015   Procedure: CYSTOSCOPY WITH RETROGRADE PYELOGRAM,  AND STENT PLACEMENT;  Surgeon: Alexis Frock, MD;  [facility];  Service: Urology;  Laterality: Right;   CYSTOSCOPY WITH RETROGRADE PYELOGRAM, URETEROSCOPY AND STENT PLACEMENT Right 04/01/2017   Procedure: CYSTOSCOPY WITH RETROGRADE PYELOGRAM, URETEROSCOPY AND STENT PLACEMENT;  Surgeon: Alexis Frock, MD;  [facility];  Service: Urology;  Laterality: Right;   CYSTOSCOPY WITH STENT PLACEMENT Right 10/27/2016   Procedure: CYSTOSCOPY WITH STENT CHANGE and right retrograde pyelogram;  Surgeon: Alexis Frock, MD;  Location: [facility];  Service: Urology;  Laterality: Right;   EUS N/A 10/02/2014   Procedure: LOWER ENDOSCOPIC ULTRASOUND (EUS);  Surgeon: Arta Silence, MD;  Location: [facility];  Service: Endoscopy;  Laterality: N/A;   EXCISION SOFT TISSUE MASS RIGHT FOREMAN  12-08-2006   EYE SURGERY  as child   pytosis of eyelids repair   INCISION AND DRAINAGE OF WOUND Bilateral 12/26/2015   Procedure: DEBRIDEMENT OF BILATERAL MASTECTOMY FLAPS;  Surgeon: Irene Limbo, MD;  Location: [facility];  Service: Plastics;  Laterality: Bilateral;   IR CV LINE INJECTION  05/31/2017   IR  FLUORO GUIDE CV LINE LEFT  05/31/2017   IR FLUORO GUIDE CV LINE RIGHT  04/06/2017   IR FLUORO GUIDE CV MIDLINE PICC RIGHT  05/30/2017   IR NEPHROSTOGRAM LEFT INITIAL PLACEMENT  09/02/2017   IR NEPHROSTOGRAM LEFT THRU EXISTING ACCESS  11/29/2017   IR NEPHROSTOGRAM RIGHT INITIAL PLACEMENT  09/02/2017   IR NEPHROSTOGRAM RIGHT THRU EXISTING ACCESS  09/13/2017   IR NEPHROSTOGRAM RIGHT THRU EXISTING ACCESS  11/29/2017   IR NEPHROSTOMY EXCHANGE LEFT  11/28/2017   IR NEPHROSTOMY EXCHANGE LEFT  01/05/2018   IR NEPHROSTOMY EXCHANGE LEFT  02/16/2018   IR NEPHROSTOMY EXCHANGE LEFT  03/30/2018   IR NEPHROSTOMY EXCHANGE LEFT  05/12/2018   IR NEPHROSTOMY EXCHANGE LEFT  06/21/2018   IR NEPHROSTOMY EXCHANGE LEFT  08/04/2018   IR NEPHROSTOMY EXCHANGE LEFT  09/18/2018   IR NEPHROSTOMY EXCHANGE LEFT  10/09/2018   IR NEPHROSTOMY EXCHANGE LEFT  10/27/2018   IR NEPHROSTOMY EXCHANGE LEFT  11/21/2018   IR NEPHROSTOMY EXCHANGE LEFT  01/05/2019   IR NEPHROSTOMY EXCHANGE LEFT  02/15/2019   IR NEPHROSTOMY EXCHANGE LEFT  03/29/2019   IR NEPHROSTOMY EXCHANGE RIGHT  10/02/2017   IR NEPHROSTOMY EXCHANGE RIGHT  11/28/2017   IR NEPHROSTOMY EXCHANGE RIGHT  01/05/2018   IR NEPHROSTOMY EXCHANGE RIGHT  02/16/2018   IR NEPHROSTOMY EXCHANGE RIGHT  03/30/2018   IR NEPHROSTOMY EXCHANGE RIGHT  05/12/2018   IR NEPHROSTOMY EXCHANGE RIGHT  06/21/2018   IR NEPHROSTOMY EXCHANGE RIGHT  08/04/2018   IR NEPHROSTOMY EXCHANGE RIGHT  09/18/2018   IR NEPHROSTOMY EXCHANGE RIGHT  10/27/2018   IR NEPHROSTOMY EXCHANGE RIGHT  11/21/2018   IR NEPHROSTOMY EXCHANGE RIGHT  01/05/2019   IR NEPHROSTOMY EXCHANGE RIGHT  02/15/2019   IR NEPHROSTOMY EXCHANGE RIGHT  03/29/2019   IR NEPHROSTOMY PLACEMENT LEFT  10/02/2017   IR RADIOLOGIST EVAL & MGMT  05/03/2017   IR US GUIDE VASC ACCESS LEFT  05/31/2017   IR US GUIDE VASC ACCESS RIGHT  04/06/2017   IR US GUIDE VASC ACCESS RIGHT  05/30/2017   LAPAROSCOPIC CHOLECYSTECTOMY  1990   LIPOSUCTION WITH LIPOFILLING Bilateral 04/16/2016  Procedure:  LIPOSUCTION WITH LIPOFILLING TO BILATERAL CHEST;  Surgeon: Irene Limbo, MD;  Location: [facility];  Service: Plastics;  Laterality: Bilateral;   MASTECTOMY W/ SENTINEL NODE BIOPSY Bilateral 12/11/2015   Procedure: RIGHT PROPHYLACTIC MASTECTOMY, LEFT TOTAL MASTECTOMY WITH LEFT AXILLARY SENTINEL LYMPH NODE BIOPSY;  Surgeon: Stark Klein, MD;  Location: [place];  Service: General;  Laterality: Bilateral;   OSTOMY N/A 11/19/2014   Procedure: OSTOMY;  Surgeon: Michael [name], MD;  Location: [facility];  Service: General;  Laterality: N/A;   PROCTOSCOPY N/A 04/01/2017   Procedure: RIDGE PROCTOSCOPY;  Surgeon: Michael [name], MD;  Location: [facility];  Service: General;  Laterality: N/A;   REMOVAL OF BILATERAL TISSUE EXPANDERS WITH PLACEMENT OF BILATERAL BREAST IMPLANTS Bilateral 04/16/2016   Procedure: REMOVAL OF BILATERAL TISSUE EXPANDERS WITH PLACEMENT OF BILATERAL BREAST IMPLANTS;  Surgeon: Irene Limbo, MD;  Location: [facility];  Service: Plastics;  Laterality: Bilateral;   ROBOTIC ASSISTED LAP VAGINAL HYSTERECTOMY N/A 11/19/2014   Procedure: ROBOTIC LYSIS OF ADHESIONS, CONVERTED TO LAPAROTOMY RADICAL UPPER VAGINECTOMY,LOW ANTERIOR BOWEL RESECTION, COLOSTOMY, BILATERAL URETERAL STENT PLACEMENT AND CYSTONOMY CLOSURE;  Surgeon: Everitt Amber, MD;  Location: [facility];  Service: Gynecology;  Laterality: N/A;   TISSUE EXPANDER FILLING Bilateral 12/26/2015   Procedure: EXPANSION OF BILATERAL CHEST TISSUE EXPANDERS (60 mL- Right; 75 mL- Left);  Surgeon: Irene Limbo, MD;  Location: [facility];  Service: Plastics;  Laterality: Bilateral;   TONSILLECTOMY     TOTAL ABDOMINAL HYSTERECTOMY  March 2006   [facility]   and Bilateral Salpingoophorectomy/  staging for Ovarian cancer/  an   XI ROBOTIC ASSISTED LOWER ANTERIOR RESECTION N/A 04/01/2017   Procedure: XI ROBOTIC VS LAPAROSCOPIC COLOSTOMY TAKEDOWN WITH LYSIS OF ADHESIONS.;  Surgeon: Michael [name], MD;  Location[facility];   Service: General;  Laterality: N/A;  ERAS PATHWAY    Family History  Problem Relation Age of Onset   Cancer Mother 70       stomach ca   Hypertension Mother    Cancer Father 57       prostate ca   Diabetes Father    Heart disease Father        CABG   Hypertension Father    Hyperlipidemia Father    Obesity Father    Breast cancer Maternal Aunt        dx in her 89s   Lymphoma Paternal Aunt    Brain cancer Paternal Grandfather    Ovarian cancer Other    Diabetes Sister    Hypertension Brother y-10   Heart disease Brother        CABG   Diabetes Brother     Social History   Socioeconomic History   Marital status: Married    Spouse name: Armed forces technical officer   Number of children: 1   Years of education: Not on file   Highest education level: Not on file  Occupational History   Occupation: retired Therapist, 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, sports - retired  Tobacco Use   Smoking status: Never   Smokeless tobacco: Never  Vaping Use   Vaping Use: Never used  Substance and Sexual Activity   Alcohol use: Not Currently   Drug use: No   Sexual activity: Not on file  Other Topics Concern   Not on file  Social History Narrative   Exercise-- has not gotten back into it since cancer came back   Social Determinants of Health   Financial Resource Strain: Not  on file  Food Insecurity: Not on file  Transportation Needs: Not on file  Physical Activity: Not on file  Stress: Not on file  Social Connections: Not on file    Current Medications:  Current Outpatient Medications:    acetaminophen (TYLENOL) 325 MG tablet, Take 2 tablets (650 mg total) by mouth every 6 (six) hours as needed., Disp: 30 tablet, Rfl: 1   albuterol (PROVENTIL HFA) 108 (90 Base) MCG/ACT inhaler, Inhale 1 puff into the lungs every 6 (six) hours as needed for wheezing or shortness of breath., Disp: 18 g, Rfl: 2   apixaban (ELIQUIS) 5 MG TABS tablet, Take 1 tablet (5 mg total) by mouth 2 (two) times daily., Disp: 180 tablet, Rfl:  1   benzonatate (TESSALON) 100 MG capsule, Take 1 capsule (100 mg total) by mouth 3 (three) times daily as needed for cough., Disp: 30 capsule, Rfl: 0   Biotin 5 MG TABS, Take 5 mg by mouth daily. , Disp: , Rfl:    buPROPion (WELLBUTRIN SR) 150 MG 12 hr tablet, Take 1 tablet (150 mg total) by mouth 2 (two) times daily., Disp: 180 tablet, Rfl: 0   calcium citrate-vitamin D (CITRACAL+D) 315-200 MG-UNIT tablet, Take 1 tablet by mouth daily., Disp: , Rfl:    Cholecalciferol (VITAMIN D3) 10000 UNITS capsule, Take 10,000 Units by mouth See admin instructions. Take 1 tablet by mouth 4 times per week., Disp: , Rfl:    clobetasol (OLUX) 0.05 % topical foam, Apply topically 2 (two) times daily., Disp: 50 g, Rfl: 3   diphenhydrAMINE (BENADRYL) 25 mg capsule, Take 1 capsule (25 mg total) by mouth every 8 (eight) hours as needed for itching, allergies or sleep., Disp: 30 capsule, Rfl: 0   famotidine-calcium carbonate-magnesium hydroxide (PEPCID COMPLETE) 10-800-165 MG chewable tablet, Chew by mouth as needed., Disp: , Rfl:    ferrous sulfate 325 (65 FE) MG tablet, Take 1 tablet (325 mg total) by mouth at bedtime., Disp: 30 tablet, Rfl: 3   fluticasone (FLONASE) 50 MCG/ACT nasal spray, SHAKE LIQUID AND USE 2 SPRAYS IN EACH NOSTRIL DAILY, Disp: 48 g, Rfl: 0   HYDROcodone bit-homatropine (HYCODAN) 5-1.5 MG/5ML syrup, Take 5 mLs by mouth every 6 (six) hours as needed for cough., Disp: 120 mL, Rfl: 0   influenza vaccine adjuvanted (FLUAD QUADRIVALENT) 0.5 ML injection, Inject into the muscle., Disp: 0.5 mL, Rfl: 0   levothyroxine (SYNTHROID, LEVOTHROID) 150 MCG tablet, Take 1 tablet (150 mcg total) by mouth daily before breakfast., Disp: 30 tablet, Rfl: 1   loratadine (CLARITIN) 10 MG tablet, Take 10 mg by mouth daily. , Disp: , Rfl:    Melatonin 10 MG TABS, Take 1 tablet by mouth at bedtime as needed., Disp: , Rfl:    mupirocin ointment (BACTROBAN) 2 %, Place 1 application into the nose 2 (two) times daily., Disp: 22  g, Rfl: 0   omega-3 acid ethyl esters (LOVAZA) 1 G capsule, Take 1 g by mouth 2 (two) times daily. , Disp: , Rfl:    Polyethyl Glycol-Propyl Glycol (SYSTANE OP), Place 1 drop into both eyes daily as needed (dry eyes). , Disp: , Rfl:    pregabalin (LYRICA) 50 MG capsule, Take 1 capsule (50 mg total) by mouth 2 (two) times daily., Disp: 60 capsule, Rfl: 5   Prenatal Vit-Fe Fumarate-FA (PRENATAL VITAMIN PO), Take 1 capsule by mouth daily. Takes prenatal because there are no dyes in it, Disp: , Rfl:    Probiotic Product (ALIGN PO), Take by mouth 2 (  two) times daily., Disp: , Rfl:    rifaximin (XIFAXAN) 550 MG TABS tablet, Take 550 mg by mouth See admin instructions. Take one tablet by mouth three times daily for 14 days when needed., Disp: , Rfl:    rosuvastatin (CRESTOR) 10 MG tablet, Take 10 mg by mouth every evening. , Disp: , Rfl:    Saccharomyces boulardii (FLORASTOR PO), Take 1 capsule by mouth daily., Disp: , Rfl:    Semaglutide, 1 MG/DOSE, 4 MG/3ML SOPN, Inject 1 mg as directed once a week., Disp: 9 mL, Rfl: 0   Tazarotene (ARAZLO) 0.045 % LOTN, Apply 1 application topically daily., Disp: 45 g, Rfl: 0   Tbo-Filgrastim (GRANIX) 480 MCG/0.8ML SOSY injection, 480 mcg subcutaneous injection every 6 days life long, Disp: 11.2 mL, Rfl: 11   tiZANidine (ZANAFLEX) 4 MG tablet, Take 0.5-1 tablets (2-4 mg total) by mouth every 8 (eight) hours as needed for muscle spasms., Disp: 60 tablet, Rfl: 1  Review of Systems: Pertinent positives includes increased reflux, difficulty digesting meats, palpitations, abdominal distention, pelvic pain, vaginal bleeding, ringing in ears, back pain, bruising/bleeding easily, rectal bleeding. Denies appetite changes, fevers, chills, fatigue, unexplained weight changes. Denies hearing loss, neck lumps or masses, mouth sores or voice changes. Denies cough or wheezing.  Denies shortness of breath. Denies chest pain. Denies leg swelling. Denies abdominal pain, blood in  stools, constipation, diarrhea, nausea, vomiting, or early satiety. Denies pain with intercourse, dysuria, frequency, hematuria or incontinence. Denies hot flashes.   Denies joint pain or muscle pain/cramps. Denies itching, rash, or wounds. Denies dizziness, headaches, numbness or seizures. Denies swollen lymph nodes or glands. Denies anxiety, depression, confusion, or decreased concentration.  Physical Exam: BP 138/64 (BP Location: Left Arm, Patient Position: Sitting)    Pulse 65    Temp 98.3 F (36.8 C) (Oral)    Resp 18    Ht 5' 3.39" (1.61 m)    Wt 217 lb (98.4 kg)    SpO2 100%    BMI 37.97 kg/m  General: Alert, oriented, no acute distress. HEENT: Normocephalic, atraumatic, sclera anicteric. Chest: Clear to auscultation bilaterally.  No wheezes or rhonchi. Cardiovascular: Regular rate and rhythm, no murmurs. Abdomen: Obese, soft, nontender.  Normoactive bowel sounds.  Ostomy bag in each lower quadrant.  Around the left lower quadrant bag, there is significant erythema as well as skin breakdown noted.  Additionally, there are multiple healing wounds along the lower abdomen and left side of the mons that appear consistent with healing soft tissue infection sites. Extremities: Grossly normal range of motion.  Warm, well perfused.  No edema bilaterally. Lymphatics: No cervical, supraclavicular, or inguinal adenopathy. GU: Approximately 1 x 1 cm mobile firm nodule in the upper aspect of the left vulva, near the left groin, approximately 36m below the skin surface.  Mildly tender with palpation.  Rest of pelvic exam deferred.  Laboratory & Radiologic Studies: None new  Assessment: PLuca Burstonis a 66y.o. woman with history of recurrent endometrioid endometrial/ovarian cancer.   S/p exploratory laparotomy, posterior supralevator pelvic exenteration, end colostomy, bladder repair, ureteral stenting with complete resection and negative margins on 11/09/14. S/p adjuvant radiation  completed 03/10/15. Disease free since this time.  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   [facility] and cystectomy and formation of permanent colostomy and  ileal conduit.  Vaginectomy not performed.   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 No specific follow-up or surveillance recommended by genetics counselor.  Plan: 1) History of recurrent endometrial/ovarian cancer: s/p complete resection with negative margins and s/p adjuvant radiation. No chemotherapy (in the recurrent setting) due to chronic idiopathic neutropenia. She has a history of primary adjuvant chemotherapy after her initial diagnosis of stage III endometrial cancer in 2006. CT imaging and extensive pelvic surgical evaluation in 2020 demonstrated complete clinical response. Most recent imaging in early 2022 with no definitive evidence of recurrent disease. Now with some new abdominal symptoms, will get CT to evaluate for evidence of recurrence as well as presacral fluid collection that continues to lead to vaginal/rectal bleeding and drainage.    Will continue annual surveillance exams until 2026.    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 She has persistent vaginal drainage despite cystectomy and total proctectomy. She patient continues to have vaginal and rectal bleeding. She's been previously counseled that in the setting of vagina having been left in situ at the time of her exenteration in 04/2019, surgery to performed total vaginectomy and placement of myocutaneous flap would require another major  surgery that would likely involve having her colostomy and ileal conduit having to be taken down and would be associated with significant morbidity.  Additionally there would be no guarantee that her tissues, severely damaged by radiation, would heal such as a second surgery and result in good outcomes. Will plan CT to re-evaluate this presacral fluid collection. The patient is now on anti-coagulation and has increased bleeding. We discussed a couple of possible treatment consideration pending repeat evaluation with imaging. One would be discussion with IR to see if any possibility to embolize blood supply to this area. The other would be consideration of hyperbaric oxygen therapy is if there continues to be no evidence of disease recurrence.    3) Left vulvar/groin lesion Suspect that this is a lipoma or other benign lesion. The patient is more and more symptomatic from this lesion as it grows and would like to pursue excision. Given size and depth, I recommended that this be done as outpatient surgery. Will plan EUA at the time of excision.   4) neutropenia - Dr Alvy Bimler is managing this and we appreciate. Patient is on granix. Discussed with Hassan Rowan, Dr. Calton Dach nurse, helping the patient with prior authorization.    5) breast cancer - history of stage IIA. ER/PR positive. Treated with surgery and arimidex.   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 Patient follows-up with Dr Cristina Gong for close colon cancer and upper GI cancer surveillance  7) Concern for soft tissue infection/chronic skin breakdown at site of colostomy. Would benefit from meeting with ostomy team for any additional suggestions for skin protection. Will plan to treat with week of doxycycline as patient has noted significant improvement in skin condition with previous antibiotic therapy.   45 minutes of total time was spent for this patient encounter, including preparation, face-to-face counseling with the patient and  coordination of care, and documentation of the encounter.  Jeral Pinch, MD  Division of Gynecologic Oncology  Department of Obstetrics and Gynecology  [facility]

## 2021-08-18 ENCOUNTER — Encounter (HOSPITAL_BASED_OUTPATIENT_CLINIC_OR_DEPARTMENT_OTHER): Payer: Self-pay | Admitting: Gynecologic Oncology

## 2021-08-18 ENCOUNTER — Other Ambulatory Visit: Payer: Self-pay

## 2021-08-18 ENCOUNTER — Telehealth: Payer: Self-pay

## 2021-08-18 DIAGNOSIS — N9089 Other specified noninflammatory disorders of vulva and perineum: Secondary | ICD-10-CM | POA: Insufficient documentation

## 2021-08-18 NOTE — Progress Notes (Addendum)
Addendum:  received lov note from dr Hollie Salk, nephrologist, dated 04-29-2021 placed w/ chart   Spoke w/ via phone for pre-op interview--- pt Lab needs dos----  istat             Lab results------ current ekg in epic./ chart COVID test -----patient states asymptomatic no test needed Arrive at ------- 0915 on 08-19-2021 NPO after MN NO Solid Food.  Clear liquids from MN until--- 0815  Med rec completed Medications to take morning of surgery ----- wellbutrin, claritin, synthroid, lyrica Diabetic medication ----- n/a Patient instructed no nail polish to be worn day of surgery Patient instructed to bring photo id and insurance card day of surgery Patient aware to have Driver (ride ) / caregiver  for 24 hours after surgery -- husband, james  Patient Special Instructions ----- pt asked to bring supplies for ileostomy and urostomy  Pre-Op special Istructions ----- pt was given instructions from Dr Berline Lopes per Joylene John NP to stop eliquis today and tomorrow [facility]).  Melissa stated per Dr Berline Lopes since procedure is   minimal she will take responsibility for pt stopping eliquis, no clearance requested.  Patient verbalized understanding of instructions that were given at this phone interview. Patient denies shortness of breath, chest pain, fever, cough at this phone interview.    Anesthesia Review: PAF;  DM2;  CKD 3;  ileostomy and urostomy present;  hx endometrial / ovarian/ breast cancer  s/p surgery's , chemoradiation Pt denies cardiac s&s, sob, and no peripheral swelling  PCP:  Dr Etter Sjogren (07-23-2021 epic) Cardiologist : Dr Linard Millers (lov 07-10-2021 epic) Nephrologist: Dr Laurena Bering (per pt lov 4-6 months ago, requested note via fax) Chest x-ray : EKG : 01-02-2021 epic Echo : 11-26-2019 epic Event monitor:  12-27-2019 epic Stress test: no Cardiac Cath :  no Activity level: denies sob w/ any actiive Fasting Blood Sugar :  92--130    / Checks Blood Sugar -- times a day:  daily in am Blood  Thinner/ Instructions /Last Dose: Eliquis ASA / Instructions/ Last Dose :  no Per pt given instructions from Dr Berline Lopes to not take dose today and tomorrow , dos.

## 2021-08-18 NOTE — Progress Notes (Signed)
Patient here with her husband to establish care with Dr. Berline Lopes and for a pre-operative discussion prior to her scheduled surgery on August 19, 2021. She is scheduled for EUA, poss vulvar bxs, excision of vulvar lesion. The surgery was discussed in detail.  See after visit summary for additional details. Visual aids used to discuss items related to surgery including sequential compression stockings, foley catheter, IV pump, multi-modal pain regimen including tylenol, photo of the surgical robot, female reproductive system to discuss surgery in detail.      Discussed post-op pain management in detail including the aspects of the enhanced recovery pathway.  We discussed the use of tylenol post-op and to monitor for a maximum of 4,000 mg in a 24 hour period.     Discussed the use of SCDs and measures to take at home to prevent DVT including frequent mobility.  Reportable signs and symptoms of DVT discussed. Post-operative instructions discussed and expectations for after surgery. Incisional care discussed as well including reportable signs and symptoms including erythema, drainage, wound separation. She is advised to hold her eliquis starting January 17 and the 18th.    10 minutes spent with the patient.  Verbalizing understanding of material discussed. No needs or concerns voiced at the end of the visit.   Advised patient and family to call for any needs.    This appointment is included in the global surgical bundle as pre-operative teaching and has no charge.

## 2021-08-18 NOTE — Telephone Encounter (Signed)
Telephone call to check on pre-operative status.  Patient compliant with pre-operative instructions.  Reinforced nothing to eat after midnight. Clear liquids until 0815. Patient to arrive at 0915.  No questions or concerns voiced.  Instructed to call for any needs.  °

## 2021-08-19 ENCOUNTER — Ambulatory Visit (HOSPITAL_BASED_OUTPATIENT_CLINIC_OR_DEPARTMENT_OTHER): Payer: BC Managed Care – PPO | Admitting: Anesthesiology

## 2021-08-19 ENCOUNTER — Encounter (HOSPITAL_BASED_OUTPATIENT_CLINIC_OR_DEPARTMENT_OTHER)
Admission: RE | Disposition: A | Discharge status: Home / Self Care | Payer: Self-pay | Source: Home / Self Care | Attending: Gynecologic Oncology

## 2021-08-19 ENCOUNTER — Encounter (HOSPITAL_BASED_OUTPATIENT_CLINIC_OR_DEPARTMENT_OTHER): Payer: Self-pay | Admitting: Gynecologic Oncology

## 2021-08-19 ENCOUNTER — Ambulatory Visit (HOSPITAL_BASED_OUTPATIENT_CLINIC_OR_DEPARTMENT_OTHER)
Admission: RE | Admit: 2021-08-19 | Discharge: 2021-08-19 | Disposition: A | Discharge status: Home / Self Care | Payer: BC Managed Care – PPO | Attending: Gynecologic Oncology | Admitting: Gynecologic Oncology

## 2021-08-19 DIAGNOSIS — Z923 Personal history of irradiation: Secondary | ICD-10-CM | POA: Diagnosis not present

## 2021-08-19 DIAGNOSIS — Z79811 Long term (current) use of aromatase inhibitors: Secondary | ICD-10-CM | POA: Diagnosis not present

## 2021-08-19 DIAGNOSIS — L720 Epidermal cyst: Secondary | ICD-10-CM | POA: Insufficient documentation

## 2021-08-19 DIAGNOSIS — E1122 Type 2 diabetes mellitus with diabetic chronic kidney disease: Secondary | ICD-10-CM | POA: Insufficient documentation

## 2021-08-19 DIAGNOSIS — E039 Hypothyroidism, unspecified: Secondary | ICD-10-CM | POA: Insufficient documentation

## 2021-08-19 DIAGNOSIS — Z9221 Personal history of antineoplastic chemotherapy: Secondary | ICD-10-CM | POA: Diagnosis not present

## 2021-08-19 DIAGNOSIS — Z6838 Body mass index (BMI) 38.0-38.9, adult: Secondary | ICD-10-CM | POA: Diagnosis not present

## 2021-08-19 DIAGNOSIS — I129 Hypertensive chronic kidney disease with stage 1 through stage 4 chronic kidney disease, or unspecified chronic kidney disease: Secondary | ICD-10-CM | POA: Diagnosis not present

## 2021-08-19 DIAGNOSIS — Z8543 Personal history of malignant neoplasm of ovary: Secondary | ICD-10-CM | POA: Insufficient documentation

## 2021-08-19 DIAGNOSIS — N898 Other specified noninflammatory disorders of vagina: Secondary | ICD-10-CM | POA: Diagnosis not present

## 2021-08-19 DIAGNOSIS — Z853 Personal history of malignant neoplasm of breast: Secondary | ICD-10-CM | POA: Insufficient documentation

## 2021-08-19 DIAGNOSIS — D709 Neutropenia, unspecified: Secondary | ICD-10-CM | POA: Insufficient documentation

## 2021-08-19 DIAGNOSIS — Z932 Ileostomy status: Secondary | ICD-10-CM | POA: Insufficient documentation

## 2021-08-19 DIAGNOSIS — Z7901 Long term (current) use of anticoagulants: Secondary | ICD-10-CM | POA: Insufficient documentation

## 2021-08-19 DIAGNOSIS — E669 Obesity, unspecified: Secondary | ICD-10-CM | POA: Insufficient documentation

## 2021-08-19 DIAGNOSIS — E119 Type 2 diabetes mellitus without complications: Secondary | ICD-10-CM | POA: Diagnosis not present

## 2021-08-19 DIAGNOSIS — Z8542 Personal history of malignant neoplasm of other parts of uterus: Secondary | ICD-10-CM | POA: Insufficient documentation

## 2021-08-19 DIAGNOSIS — N9089 Other specified noninflammatory disorders of vulva and perineum: Secondary | ICD-10-CM

## 2021-08-19 DIAGNOSIS — I10 Essential (primary) hypertension: Secondary | ICD-10-CM | POA: Diagnosis not present

## 2021-08-19 DIAGNOSIS — N189 Chronic kidney disease, unspecified: Secondary | ICD-10-CM | POA: Insufficient documentation

## 2021-08-19 DIAGNOSIS — Z933 Colostomy status: Secondary | ICD-10-CM | POA: Diagnosis not present

## 2021-08-19 DIAGNOSIS — I4891 Unspecified atrial fibrillation: Secondary | ICD-10-CM | POA: Diagnosis not present

## 2021-08-19 DIAGNOSIS — K449 Diaphragmatic hernia without obstruction or gangrene: Secondary | ICD-10-CM | POA: Insufficient documentation

## 2021-08-19 HISTORY — DX: Other specified noninflammatory disorders of vulva and perineum: N90.89

## 2021-08-19 HISTORY — DX: Paroxysmal atrial fibrillation: I48.0

## 2021-08-19 HISTORY — DX: Long term (current) use of anticoagulants: Z79.01

## 2021-08-19 HISTORY — DX: Chronic kidney disease, unspecified: N18.9

## 2021-08-19 HISTORY — DX: Polyneuropathy, unspecified: G62.9

## 2021-08-19 HISTORY — PX: VULVAR LESION REMOVAL: SHX5391

## 2021-08-19 HISTORY — DX: Chronic kidney disease, stage 3 unspecified: N18.30

## 2021-08-19 HISTORY — DX: Anemia in chronic kidney disease: D63.1

## 2021-08-19 LAB — POCT I-STAT, CHEM 8
BUN: 30 mg/dL — ABNORMAL HIGH (ref 8–23)
Calcium, Ion: 1.23 mmol/L (ref 1.15–1.40)
Chloride: 106 mmol/L (ref 98–111)
Creatinine, Ser: 1.6 mg/dL — ABNORMAL HIGH (ref 0.44–1.00)
Glucose, Bld: 111 mg/dL — ABNORMAL HIGH (ref 70–99)
HCT: 38 % (ref 36.0–46.0)
Hemoglobin: 12.9 g/dL (ref 12.0–15.0)
Potassium: 3.9 mmol/L (ref 3.5–5.1)
Sodium: 140 mmol/L (ref 135–145)
TCO2: 22 mmol/L (ref 22–32)

## 2021-08-19 LAB — GLUCOSE, CAPILLARY: Glucose-Capillary: 82 mg/dL (ref 70–99)

## 2021-08-19 SURGERY — EXAM UNDER ANESTHESIA
Anesthesia: Monitor Anesthesia Care | Site: Vagina

## 2021-08-19 MED ORDER — ONDANSETRON HCL 4 MG PO TABS: 4.0000 mg | ORAL_TABLET | Freq: Four times a day (QID) | ORAL | Status: DC | PRN

## 2021-08-19 MED ORDER — MIDAZOLAM HCL 5 MG/5ML IJ SOLN
INTRAMUSCULAR | Status: DC | PRN
  Administered 2021-08-19 (×2): 1 mg via INTRAVENOUS

## 2021-08-19 MED ORDER — ACETAMINOPHEN 500 MG PO TABS
1000.0000 mg | ORAL_TABLET | ORAL | Status: AC
  Administered 2021-08-19: 1000 mg via ORAL

## 2021-08-19 MED ORDER — MIDAZOLAM HCL 2 MG/2ML IJ SOLN
INTRAMUSCULAR | Status: AC
  Filled 2021-08-19: qty 2

## 2021-08-19 MED ORDER — FENTANYL CITRATE (PF) 100 MCG/2ML IJ SOLN: 25.0000 ug | INTRAMUSCULAR | Status: DC | PRN

## 2021-08-19 MED ORDER — DEXAMETHASONE SODIUM PHOSPHATE 10 MG/ML IJ SOLN: 4.0000 mg | INTRAMUSCULAR | Status: DC

## 2021-08-19 MED ORDER — ONDANSETRON HCL 4 MG/2ML IJ SOLN: 4.0000 mg | Freq: Once | INTRAMUSCULAR | Status: DC | PRN

## 2021-08-19 MED ORDER — SCOPOLAMINE 1 MG/3DAYS TD PT72
1.0000 | MEDICATED_PATCH | TRANSDERMAL | Status: DC
  Administered 2021-08-19: 1.5 mg via TRANSDERMAL

## 2021-08-19 MED ORDER — FENTANYL CITRATE (PF) 100 MCG/2ML IJ SOLN
INTRAMUSCULAR | Status: AC
  Filled 2021-08-19: qty 2

## 2021-08-19 MED ORDER — PROPOFOL 500 MG/50ML IV EMUL
INTRAVENOUS | Status: AC
  Filled 2021-08-19: qty 50

## 2021-08-19 MED ORDER — PROPOFOL 500 MG/50ML IV EMUL
INTRAVENOUS | Status: DC | PRN
Start: 2021-08-19 — End: 2021-08-19
  Administered 2021-08-19: 100 ug/kg/min via INTRAVENOUS

## 2021-08-19 MED ORDER — SODIUM CHLORIDE 0.9 % IV SOLN: INTRAVENOUS | Status: DC

## 2021-08-19 MED ORDER — EPHEDRINE SULFATE-NACL 50-0.9 MG/10ML-% IV SOSY
PREFILLED_SYRINGE | INTRAVENOUS | Status: DC | PRN
  Administered 2021-08-19: 10 mg via INTRAVENOUS

## 2021-08-19 MED ORDER — ACETAMINOPHEN 500 MG PO TABS
ORAL_TABLET | ORAL | Status: AC
  Filled 2021-08-19: qty 2

## 2021-08-19 MED ORDER — PROPOFOL 10 MG/ML IV BOLUS
INTRAVENOUS | Status: DC | PRN
  Administered 2021-08-19: 30 mg via INTRAVENOUS

## 2021-08-19 MED ORDER — ONDANSETRON HCL 4 MG/2ML IJ SOLN
INTRAMUSCULAR | Status: DC | PRN
  Administered 2021-08-19: 4 mg via INTRAVENOUS

## 2021-08-19 MED ORDER — SCOPOLAMINE 1 MG/3DAYS TD PT72
MEDICATED_PATCH | TRANSDERMAL | Status: AC
  Filled 2021-08-19: qty 1

## 2021-08-19 MED ORDER — FENTANYL CITRATE (PF) 100 MCG/2ML IJ SOLN
INTRAMUSCULAR | Status: DC | PRN
  Administered 2021-08-19 (×2): 50 ug via INTRAVENOUS

## 2021-08-19 MED ORDER — ONDANSETRON HCL 4 MG/2ML IJ SOLN
INTRAMUSCULAR | Status: AC
  Filled 2021-08-19: qty 2

## 2021-08-19 MED ORDER — ONDANSETRON HCL 4 MG/2ML IJ SOLN: 4.0000 mg | Freq: Four times a day (QID) | INTRAMUSCULAR | Status: DC | PRN

## 2021-08-19 MED ORDER — AMISULPRIDE (ANTIEMETIC) 5 MG/2ML IV SOLN: 10.0000 mg | Freq: Once | INTRAVENOUS | Status: DC | PRN

## 2021-08-19 MED ORDER — 0.9 % SODIUM CHLORIDE (POUR BTL) OPTIME
TOPICAL | Status: DC | PRN
  Administered 2021-08-19: 500 mL

## 2021-08-19 SURGICAL SUPPLY — 32 items
ADH SKN CLS APL DERMABOND .7 (GAUZE/BANDAGES/DRESSINGS) ×2
BLADE CLIPPER SENSICLIP SURGIC (BLADE) IMPLANT
BLADE SURG 11 STRL SS (BLADE) ×1 IMPLANT
BLADE SURG 15 STRL LF DISP TIS (BLADE) ×2 IMPLANT
BLADE SURG 15 STRL SS (BLADE)
CATH ROBINSON RED A/P 16FR (CATHETERS) ×2 IMPLANT
DERMABOND ADVANCED (GAUZE/BANDAGES/DRESSINGS) ×1
DERMABOND ADVANCED .7 DNX12 (GAUZE/BANDAGES/DRESSINGS) IMPLANT
GAUZE 4X4 16PLY ~~LOC~~+RFID DBL (SPONGE) ×5 IMPLANT
GLOVE SURG ENC MOIS LTX SZ6 (GLOVE) ×6 IMPLANT
GOWN STRL REUS W/TWL LRG LVL3 (GOWN DISPOSABLE) ×3 IMPLANT
KIT TURNOVER CYSTO (KITS) ×3 IMPLANT
NDL HYPO 25X1 1.5 SAFETY (NEEDLE) ×2 IMPLANT
NEEDLE HYPO 25X1 1.5 SAFETY (NEEDLE) ×3 IMPLANT
NS IRRIG 500ML POUR BTL (IV SOLUTION) ×3 IMPLANT
PACK PERINEAL COLD (PAD) ×3 IMPLANT
PACK VAGINAL WOMENS (CUSTOM PROCEDURE TRAY) ×3 IMPLANT
PENCIL BUTTON HOLSTER BLD 10FT (ELECTRODE) ×2 IMPLANT
PUNCH BIOPSY DERMAL 3 (INSTRUMENTS) IMPLANT
PUNCH BIOPSY DERMAL 3MM (INSTRUMENTS)
PUNCH BIOPSY DERMAL 4MM (INSTRUMENTS) IMPLANT
SUT VIC AB 0 SH 27 (SUTURE) ×2 IMPLANT
SUT VIC AB 2-0 CT2 27 (SUTURE) IMPLANT
SUT VIC AB 2-0 SH 27 (SUTURE)
SUT VIC AB 2-0 SH 27XBRD (SUTURE) IMPLANT
SUT VIC AB 3-0 SH 27 (SUTURE) ×3
SUT VIC AB 3-0 SH 27X BRD (SUTURE) ×2 IMPLANT
SUT VIC AB 4-0 PS2 18 (SUTURE) ×3 IMPLANT
SWAB OB GYN 8IN STERILE 2PK (MISCELLANEOUS) ×1 IMPLANT
SYR BULB IRRIG 60ML STRL (SYRINGE) ×2 IMPLANT
TOWEL OR 17X26 10 PK STRL BLUE (TOWEL DISPOSABLE) ×5 IMPLANT
WATER STERILE IRR 500ML POUR (IV SOLUTION) ×2 IMPLANT

## 2021-08-19 NOTE — Interval H&P Note (Signed)
History and Physical Interval Note:  08/19/2021 10:15 AM  [name]  has presented today for surgery, with the diagnosis of vuvlar lesion.  The various methods of treatment have been discussed with the patient and family. After consideration of risks, benefits and other options for treatment, the patient has consented to  Procedure(s): EXAM UNDER ANESTHESIA (N/A) VULVAR LESION with possible biopsies (N/A) as a surgical intervention.  The patient's history has been reviewed, patient examined, no change in status, stable for surgery.  I have reviewed the patient's chart and labs.  Questions were answered to the patient's satisfaction.     Lafonda Mosses

## 2021-08-19 NOTE — Brief Op Note (Signed)
08/19/2021  11:39 AM  PATIENT:  [name]  66 y.o. female  PRE-OPERATIVE DIAGNOSIS:  vuvlar lesion  POST-OPERATIVE DIAGNOSIS:  vuvlar lesion  PROCEDURE:  Procedure(s): EXAM UNDER ANESTHESIA (N/A) VULVAR LESION with possible biopsies (N/A)  SURGEON:  Surgeon(s) and Role:    Lafonda Mosses, MD - Primary  ASSISTANTS: Joylene John NP   ANESTHESIA:   local, MAC  EBL:  2 mL   BLOOD ADMINISTERED:none  DRAINS: none   LOCAL MEDICATIONS USED:  NONE  SPECIMEN:  No Specimen  DISPOSITION OF SPECIMEN:  PATHOLOGY  COUNTS:  YES  TOURNIQUET:  * No tourniquets in log *  DICTATION: .Note written in EPIC  PLAN OF CARE: Discharge to home after PACU  PATIENT DISPOSITION:  PACU - hemodynamically stable.   Delay start of Pharmacological VTE agent (>24hrs) due to surgical blood loss or risk of bleeding: not applicable

## 2021-08-19 NOTE — Anesthesia Postprocedure Evaluation (Signed)
Anesthesia Post Note  Patient: [name]  Procedure(s) Performed: EXAM UNDER ANESTHESIA (Perineum) VULVAR LESION with biopsies (Vagina )     Patient location during evaluation: PACU Anesthesia Type: MAC Level of consciousness: awake Pain management: pain level controlled Vital Signs Assessment: post-procedure vital signs reviewed and stable Respiratory status: spontaneous breathing, nonlabored ventilation, respiratory function stable and patient connected to nasal cannula oxygen Cardiovascular status: stable and blood pressure returned to baseline Postop Assessment: no apparent nausea or vomiting Anesthetic complications: no   No notable events documented.  Last Vitals:  Vitals:   08/19/21 1215 08/19/21 1257  BP: 108/61 137/74  Pulse: (!) 56 (!) 55  Resp: 11 16  Temp: (!) 36.4 C (!) 36.4 C  SpO2: 100% 100%    Last Pain:  Vitals:   08/19/21 1257  TempSrc:   PainSc: 0-No pain                 Julisa Flippo P Teasia Zapf

## 2021-08-19 NOTE — Discharge Instructions (Addendum)
No acetaminophen/Tylenol until after 4:00pm today if needed for pain.   You can resume taking your Eliquis tomorrow, August 20, 2021.  Plan to continue with your scheduled CT scan on 08/25/2021.  AFTER SURGERY INSTRUCTIONS   Return to work: 1-2 days if applicable   We recommend purchasing several bags of frozen green peas and dividing them into ziploc bags if you develop swelling. You will want to keep these in the freezer and have them ready to use as ice packs to the vulvar incision. Once the ice pack is no longer cold, you can get another from the freezer. The frozen peas mold to your body better than a regular ice pack.    Activity: 1. Be up and out of the bed during the day.  Take a nap if needed.  You may walk up steps but be careful and use the hand rail.  Stair climbing will tire you more than you think, you may need to stop part way and rest.    2. No lifting or straining for 1 weeks over 10 pounds. No pushing, pulling, straining for 1 weeks.   3. No driving for minimum 24 hours after surgery if you were cleared to drive before surgery.  Do not drive if you are taking narcotic pain medicine and make sure that your reaction time has returned.    4. You can shower as soon as the next day after surgery. Shower daily. No tub baths or submerging your body in water until cleared by your surgeon. If you have the soap that was given to you by pre-surgical testing that was used before surgery, you do not need to use it afterwards because this can irritate your incisions.    5. No sexual activity and nothing in the vagina for minimum 2 weeks.   6. You may experience vulvar spotting and discharge after surgery.  The spotting is normal but if you experience heavy bleeding, call our office.   7. Take Tylenol for pain. Monitor your Tylenol intake to a max of 4,000 mg in a 24 hour period.    Diet: 1. Low sodium Heart Healthy Diet is recommended but you are cleared to resume your normal (before  surgery) diet after your procedure.   2. It is safe to use a laxative, such as Miralax or Colace, if you have difficulty moving your bowels.    Wound Care: 1. Keep clean and dry.  Shower daily.   Reasons to call the Doctor: Fever - Oral temperature greater than 100.4 degrees Fahrenheit Foul-smelling vaginal discharge Difficulty urinating Nausea and vomiting Increased pain at the site of the incision that is unrelieved with pain medicine. Difficulty breathing with or without chest pain New calf pain especially if only on one side Sudden, continuing increased vaginal bleeding with or without clots.   Contacts: For questions or concerns you should contact:   Dr. Jeral Pinch at [phone]   Joylene John, NP at [phone]   After Hours: call [phone] and have the GYN Oncologist paged/contacted (after 5 pm or on the weekends).   Messages sent via mychart are for non-urgent matters and are not responded to after hours so for urgent needs, please call the after hours number.    Post Anesthesia Home Care Instructions  Activity: Get plenty of rest for the remainder of the day. A responsible individual must stay with you for 24 hours following the procedure.  For the next 24 hours, DO NOT: -Drive a car Film/video editor -Drink  alcoholic beverages -Take any medication unless instructed by your physician -Make any legal decisions or sign important papers.  Meals: Start with liquid foods such as gelatin or soup. Progress to regular foods as tolerated. Avoid greasy, spicy, heavy foods. If nausea and/or vomiting occur, drink only clear liquids until the nausea and/or vomiting subsides. Call your physician if vomiting continues.  Special Instructions/Symptoms: Your throat may feel dry or sore from the anesthesia or the breathing tube placed in your throat during surgery. If this causes discomfort, gargle with warm salt water. The discomfort should disappear within 24  hours.

## 2021-08-19 NOTE — Op Note (Signed)
PATIENT: [name]  [id number] DATE: 08/19/21  Preop Diagnosis: Symptomatic vulvar/groin lesion  Postoperative Diagnosis: suspected lipoma, cyst or lymph node  Surgery: Excision of 1cm left upper vulva/groin mass, EUA, vagina biopsy  Surgeons:  Valarie Cones MD  Assistant: Joylene John NP  Anesthesia: MAC  Estimated blood loss: minimal  IVF:  see I&O flowsheet   Urine output: n/a   Complications: None apparent  Pathology: Vulvar/groin lesion, vaginal biopsy from 2 o'clock  Operative findings: Small, 1cm subcutaneous nodule noted in left upper vulva/groin. Several healing skin lesions, no actively draining lesions. On speculum exam, shortened and narrowed vagina noted. Small ridge of tissue 3-4 cm proximal to introitus along left upper corner, mildly erythematous. This was biopsies. Narrowed ring of tissue noted at the vaginal apex with opening into a cavity that I am unable to visualize or palpate the proximal aspect of. On rectovaginal exam, no definitive rectovaginal fistula or rectal fistula palpated.  Procedure: The patient was identified in the preoperative holding area. Informed consent was signed on the chart. Patient was seen history was reviewed and exam was performed.   The patient was then taken to the operating room and placed in the supine position with SCD hose on. General anesthesia was then induced without difficulty. She was then placed in the dorsolithotomy position. The perineum was prepped with Betadine. The vagina was prepped with Betadine. The patient was then draped after the prep was dried.   Timeout was performed the patient, procedure, antibiotic, allergy, and length of procedure. Marking pend was used to outline subcutaneous mass. The 15 blade scalpel was used to make an incision over the skin of the lesion. The two skin edges were grasped and elevated and monopolar electrocautery was used to shell the mass out from surrounding subcutaneous tissue.  Palpation confirmed the entire lesion had been resected.  The bovie was used to obtain hemostasis at the surgical bed. The subcutaneous tissues were irrigated and made hemostatic.   The deep dermal layer was approximated with 3-0 vicryl mattress sutures to bring the skin edges into approximation and off tension. The wound was closed following langher's lines. The cutaneous layer was closed with interrupted 4-0 vicryl stitches and mattress sutures to ensure a tension free and hemostatic closure. Dermabond was applied.  Speculum exam was then performed with findings noted above. Tischler forceps were used to biopsy the vaginal tissue ridge at 2 o'clock. Electrocautery was used to achieve hemostasis.   All instrument, suture, laparotomy, Ray-Tec, and needle counts were correct x2. The patient tolerated the procedure well and was taken recovery room in stable condition.   Jeral Pinch MD Gynecologic Oncology

## 2021-08-19 NOTE — Anesthesia Preprocedure Evaluation (Addendum)
Anesthesia Evaluation  Patient identified by MRN, date of birth, ID band Patient awake    Reviewed: Allergy & Precautions, NPO status , Patient's Chart, lab work & pertinent test results  Airway Mallampati: II  TM Distance: >3 FB Neck ROM: Full    Dental no notable dental hx.    Pulmonary neg pulmonary ROS,    Pulmonary exam normal breath sounds clear to auscultation       Cardiovascular hypertension, Normal cardiovascular exam Rhythm:Regular Rate:Normal  ECG: NSR, rate 63   Neuro/Psych PSYCHIATRIC DISORDERS Depression negative neurological ROS     GI/Hepatic Neg liver ROS, hiatal hernia,   Endo/Other  diabetesHypothyroidism   Renal/GU Renal disease     Musculoskeletal negative musculoskeletal ROS (+)   Abdominal (+) + obese,   Peds  Hematology negative hematology ROS (+)   Anesthesia Other Findings vuvlar lesion  Reproductive/Obstetrics                            Anesthesia Physical Anesthesia Plan  ASA: 3  Anesthesia Plan: MAC   Post-op Pain Management:    Induction:   PONV Risk Score and Plan: 2 and Ondansetron, Dexamethasone, Propofol infusion, Midazolam and Treatment may vary due to age or medical condition  Airway Management Planned: Simple Face Mask  Additional Equipment:   Intra-op Plan:   Post-operative Plan:   Informed Consent: I have reviewed the patients History and Physical, chart, labs and discussed the procedure including the risks, benefits and alternatives for the proposed anesthesia with the patient or authorized representative who has indicated his/her understanding and acceptance.     Dental advisory given  Plan Discussed with: CRNA and Surgeon  Anesthesia Plan Comments:        Anesthesia Quick Evaluation

## 2021-08-19 NOTE — Transfer of Care (Signed)
Immediate Anesthesia Transfer of Care Note  Patient: [name]  Procedure(s) Performed: Jasmine December UNDER ANESTHESIA (Perineum) VULVAR LESION with possible biopsies (Vagina )  Patient Location: PACU  Anesthesia Type:MAC  Level of Consciousness: awake, alert  and oriented  Airway & Oxygen Therapy: Patient Spontanous Breathing and Patient connected to face mask oxygen  Post-op Assessment: Report given to RN and Post -op Vital signs reviewed and stable  Post vital signs: Reviewed and stable  Last Vitals:  Vitals Value Taken Time  BP 98/55 08/19/21 1145  Temp    Pulse 65 08/19/21 1147  Resp 20 08/19/21 1147  SpO2 100 % 08/19/21 1147  Vitals shown include unvalidated device data.  Last Pain:  Vitals:   08/19/21 0952  TempSrc: Oral  PainSc: 0-No pain      Patients Stated Pain Goal: 4 (91/69/45 0388)  Complications: No notable events documented.

## 2021-08-20 ENCOUNTER — Telehealth: Payer: Self-pay

## 2021-08-20 ENCOUNTER — Encounter (HOSPITAL_BASED_OUTPATIENT_CLINIC_OR_DEPARTMENT_OTHER): Payer: Self-pay | Admitting: Gynecologic Oncology

## 2021-08-20 NOTE — Telephone Encounter (Signed)
Spoke with [name] this morning. She states she is doing well over all "just a bit tired". She has taken a shower and her husband has inspected her incision. 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  She denies fever or chills. Incisions are dry and intact. She rates her pain 1-2/10. Her pain is controlled with an ice pack to her incision, she has not needed tylenol.    Instructed to call office with any fever, chills, purulent drainage, uncontrolled pain or any other questions or concerns. Patient verbalizes understanding.   Pt aware of post op appointments as well as the office number [phone] and after hours number [phone] to call if she has any questions or concerns

## 2021-08-21 ENCOUNTER — Telehealth: Payer: Self-pay | Admitting: Gynecologic Oncology

## 2021-08-21 ENCOUNTER — Telehealth: Payer: Self-pay

## 2021-08-21 LAB — SURGICAL PATHOLOGY

## 2021-08-21 NOTE — Telephone Encounter (Signed)
Called patient and discussed results from surgery.  She is very happy with this news.  I will plan to call her when I have CT results next week.  If this is negative for evidence of recurrent disease, we will further discuss evaluation for hyperbaric oxygen treatment or possibility of embolization.  Jeral Pinch MD Gynecologic Oncology

## 2021-08-21 NOTE — Telephone Encounter (Signed)
Called her regarding her question about Granix prior authorization. Per the prior authorization per, the Granix 422mcg/0.8ml syringes was approved on 07/21/2021. The approval coverage period is 06/21/2021 through 10/19/2021. She verbalized understanding and appreciated the call.

## 2021-08-25 ENCOUNTER — Other Ambulatory Visit: Payer: Self-pay

## 2021-08-25 ENCOUNTER — Ambulatory Visit (HOSPITAL_COMMUNITY)
Admission: RE | Admit: 2021-08-25 | Discharge: 2021-08-25 | Disposition: A | Discharge status: Home / Self Care | Payer: BC Managed Care – PPO | Source: Ambulatory Visit | Attending: Gynecologic Oncology | Admitting: Gynecologic Oncology

## 2021-08-25 ENCOUNTER — Encounter (HOSPITAL_COMMUNITY): Payer: Self-pay

## 2021-08-25 DIAGNOSIS — Z8543 Personal history of malignant neoplasm of ovary: Secondary | ICD-10-CM | POA: Diagnosis not present

## 2021-08-25 DIAGNOSIS — C55 Malignant neoplasm of uterus, part unspecified: Secondary | ICD-10-CM | POA: Diagnosis not present

## 2021-08-25 DIAGNOSIS — R1031 Right lower quadrant pain: Secondary | ICD-10-CM | POA: Insufficient documentation

## 2021-08-25 DIAGNOSIS — D3500 Benign neoplasm of unspecified adrenal gland: Secondary | ICD-10-CM | POA: Diagnosis not present

## 2021-08-25 DIAGNOSIS — N261 Atrophy of kidney (terminal): Secondary | ICD-10-CM | POA: Diagnosis not present

## 2021-08-25 MED ORDER — SODIUM CHLORIDE (PF) 0.9 % IJ SOLN
INTRAMUSCULAR | Status: AC
  Filled 2021-08-25: qty 50

## 2021-08-25 MED ORDER — IOHEXOL 300 MG/ML  SOLN
75.0000 mL | Freq: Once | INTRAMUSCULAR | Status: AC | PRN
  Administered 2021-08-25: 08:00:00 75 mL via INTRAVENOUS

## 2021-08-26 ENCOUNTER — Inpatient Hospital Stay (HOSPITAL_BASED_OUTPATIENT_CLINIC_OR_DEPARTMENT_OTHER): Payer: BC Managed Care – PPO | Admitting: Gynecologic Oncology

## 2021-08-26 DIAGNOSIS — N939 Abnormal uterine and vaginal bleeding, unspecified: Secondary | ICD-10-CM

## 2021-08-26 DIAGNOSIS — K625 Hemorrhage of anus and rectum: Secondary | ICD-10-CM

## 2021-08-26 DIAGNOSIS — L089 Local infection of the skin and subcutaneous tissue, unspecified: Secondary | ICD-10-CM

## 2021-08-26 DIAGNOSIS — T8189XS Other complications of procedures, not elsewhere classified, sequela: Secondary | ICD-10-CM

## 2021-08-26 MED ORDER — DOXYCYCLINE HYCLATE 50 MG PO CAPS: 50.0000 mg | ORAL_CAPSULE | Freq: Two times a day (BID) | ORAL | 0 refills | Status: DC

## 2021-08-26 NOTE — Progress Notes (Signed)
Gynecologic Oncology Telehealth Consult Note: Gyn-Onc  I connected with Racquel Arkin Beeks on 08/26/21 at  4:20 PM EST by telephone and verified that I am speaking with the correct person using two identifiers.  I discussed the limitations, risks, security and privacy concerns of performing an evaluation and management service by telemedicine and the availability of in-person appointments. I also discussed with the patient that there may be a patient responsible charge related to this service. The patient expressed understanding and agreed to proceed.  Other persons participating in the visit and their role in the encounter: None.  Patient's location: Home Provider's location: [facility]  Reason for Visit: Follow-up imaging  Treatment History: Oncology History Overview Note  Breast cancer of upper-inner quadrant of left female breast [facility])   Staging form: Breast, AJCC 7th Edition     Clinical stage from 12/31/2015: Stage IIA (T2, N0, M0) - Signed by Heath Lark, MD on 12/31/2015  ER,PR positive Her2 neg   History of ovarian & endometrial cancer  10/15/2004 Initial Diagnosis   History of ovarian cancer, treated with chemotherapy carbo/Taxol   03/17/2009 Bone Marrow Biopsy   Bone marrow biopsy at [facility] showed neutropenia   01/23/2014 Bone Marrow Biopsy   Repeat bone marrow biopsy showed neutropenia   09/12/2014 Imaging   CT scan showed possible cancer   10/14/2014 Imaging   MRI show significant pelvic mass without invasion into the bladder but abutting to the rectum   11/19/2014 Surgery   she underwent surgery and had robotic-assisted lysis of adhesions, converted to laparotomy, radical upper vaginectomy and low anterior resection with colostomy. 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 Resection margins were negative   01/29/2015 - 03/10/2015 Radiation Therapy   She received adjuvant radiation   03/03/2015 Imaging   CT scan of the abdomen and pelvis showed status post interval removal of the bilateral nephroureteral stents. Worsening moderate right hydroureteronephrosis. Resolved left hydroureteronephrosis.   03/20/2015 Procedure   she had cystoscopy and stent placement for right hydronephrosis   05/02/2015 Surgery   Cystoscopy with right retrograde pyelogram interpretation. Diagnostic ureteroscopy. Removal of right ureteral stent.   05/14/2015 Imaging   CT scan of the abdomen and pelvis show unilateral right hydronephrosis with no residual cancer   08/18/2015 Imaging   CT scan showed hysterectomy with stable presacral soft tissue thickening. No definitive evidence of recurrent or metastatic disease. Severe right hydronephrosis   09/12/2015 Surgery   Cystoscopy with right retrograde pyelogram interpretation. Right ureteral stent placement 5 x 24 Polaris, no tether   10/07/2016 Imaging   Stable exam.  No new or progressive findings. 2. Stable appearance abnormal presacral soft tissue compatible with post treatment change. 3. Internal right ureteral stent with persistent right hydroureteronephrosis. Differentially decreased perfusion of the right kidney suggests a component of underlying obstructive uropathy. 4. Stable 13 mm left adrenal nodule, previously characterized as adenoma.       01/27/2017 Imaging   Stable postop and post radiation changes in presacral region. No evidence of recurrent or metastatic carcinoma within the chest, abdomen, or pelvis. Stable mild to moderate right hydroureteronephrosis, with right ureteral stent in appropriate position. Stable small benign left adrenal adenoma and left thyroid lobe nodule.   10/02/2019 Tumor Marker   Patient's tumor was tested for the following markers: CA-125 Results of the tumor marker test revealed 5.8  Breast  cancer of upper-inner quadrant of left female breast ([place])  10/14/2015 Imaging   Screening mammogram showed possible distortion in the left breast.   10/24/2015 Imaging   Targeted ultrasound is performed, showing a 0.6 x 0.8 x 0.9 cm area of hypoechoic distortion at the 10 o'clock position of the left breast 5 cm from the nipple   10/24/2015 Imaging   Diagnostic imaging confirmed 0.6 x 0.8 x 0.9 cm distortion in the upper inner left breast   10/30/2015 Procedure   Left US guided biopsy was performed   10/30/2015 Pathology Results   Accession: [id number] showed invasive ductal carcinoma, ER/PR positive, Her2 neg   11/05/2015 Genetic Testing   Genetic testing did not reveal a deleterious mutation.  Genetic testing did detect a Variant of Unknown Significance in the MSH6 gene called c.389A>G..  Genes tested include: APC, ATM, AXIN2, BARD1, BMPR1A, BRCA1, BRCA2, BRIP1, CDH1, CDKN2A, CHEK2, DICER1, EPCAM, GREM1, KIT, MEN1, MLH1, MSH2, MSH6, MUTYH, NBN, NF1, PALB2, PDGFRA, PMS2, POLD1, POLE, PTEN, RAD50, RAD51C, RAD51D, SDHA, SDHB, SDHC, SDHD, SMAD4, SMARCA4. STK11, TP53, TSC1, TSC2, and VHL.  UPDATE: MSH6 c.389A>G (p.His130Arg) has been reclassified as Likely Benign.  The amended report date is February 25, 2021.   12/11/2015 Surgery   She undwerwent bilateral mastectomy, left SLN biopsy and immediate reconstruction surgeries with bilateral plasma expanders   12/11/2015 Pathology Results   Accession: [id number] mastectomy specimens showed left breast ca, pT2N0M0   12/26/2015 Pathology Results   Accession: [id number] specimen from debridement showed no cancer   12/26/2015 Surgery   she underwent surgical debridement of necrotic tissue at site of plasma expander   01/12/2016 Imaging   Bone density is normal   02/02/2016 -  Anti-estrogen oral therapy   She started taking Arimidex   10/21/2017 Procedure   Ultrasound guided biopsy of a vague hypoechoic shadowing lesion, generally corresponding to area  palpable abnormality, along chest lateral to the reconstructed left breast and inferior to the left axilla. No apparent complications.   01/17/2018 Imaging   Bone density scan is normal     Interval History: Patient reports overall doing well.  She has finished her course of doxycycline and reports skin around her stoma looks and feels better than it has in months.  She also has had almost complete cessation of vaginal and rectal bleeding.  She denies any significant pain.  She notes that there is a slightly raised area on the lateral aspect of her incision, denies any significant erythema or exudate.  Thinks that this is related to underwear rubbing there.  Past Medical/Surgical History: Past Medical History:  Diagnosis Date   Anemia in chronic kidney disease    Anticoagulant long-term use    eliquis--- managed by cardiology   Benign essential HTN    Breast cancer, left ([place]) 10/2015   oncologist--- dr Alvy Bimler--- 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+, HER2 negative/  12-11-2015 bilateral mastecotmy w/ reconstruction (no radiation and no chemo)   Cancer of corpus uteri, except isthmus ([place]) 09/2004   oncologist-- dr rossi/ dr gorsuch:/   dx endometroid endometrial & ovarian cancer s/p  chemotheapy and surgery(TAH w/ BSO) :  recurrent 11-19-2014 post pelvic surgery and radiation 01-29-2015 to 03-10-2015   Chronic idiopathic neutropenia [facility])    presumed related to chemotherapy March 2006--- followed by dr Alvy Bimler (treatment w/ G-CSF injections   Chronic nausea    CKD (chronic kidney disease), stage III [facility])    nephrologist-  dr Madelon Lips  Difficult intravenous access    small veins--- hx PICC lines   DM type 2 (diabetes mellitus, type 2) ([place])    endocriologist--  dr tyler [place] Silos   (08-18-2021  per pt checks blood sugar daily in am,  fasting sugar--92--130)   Dysrhythmia    Environmental and seasonal allergies    Generalized muscle weakness    GERD  (gastroesophageal reflux disease)    Hiatal hernia    History of abdominal abscess 04/16/2017   post surgery 04-01-2017  --- resolved 10/ 2018   History of COVID-19 05/2021   per pt mild symptoms that resolved   History of gastric polyp    2014  duodenum   History of ileus 04/16/2017   resolved w/ no surgical intervention   History of radiation therapy    01-29-2015 to 03-10-2015  pelvis 50.4Gy   History of small bowel obstruction 01/2019   Hypothyroidism    monitored by dr Legrand [place]o altheimer   Ileostomy in place [facility]) 04/01/2017   created at same time colostomy takedown./  permnant 09/ 2020   Mixed dyslipidemia    Multiple thyroid nodules    Managed by Dr. Harlow Asa   PAF (paroxysmal atrial fibrillation) [facility])    cardiologist--- dr h. Tamala Julian;  event monitor 05/ 2021 in epic, NSR with 1% burden Afib, first degree heart blockl and PACs;  echo 04/ 2021 ef 55-60%, mild LVH   Peripheral neuropathy    PONV (postoperative nausea and vomiting)    "scopolamine patch works for me"   Presence of urostomy ([place]) 04/2019   Radiation-induced dermatitis    contact dermatitis , radiation completed, rash only on ankles now.   Vitamin D deficiency    Vulval lesion    Wears glasses     Past Surgical History:  Procedure Laterality Date   APPENDECTOMY     BREAST RECONSTRUCTION WITH PLACEMENT OF TISSUE EXPANDER AND FLEX HD (ACELLULAR HYDRATED DERMIS) Bilateral 12/11/2015   Procedure: BILATERAL BREAST RECONSTRUCTION WITH PLACEMENT OF TISSUE EXPANDERS;  Surgeon: Irene Limbo, MD;  Location: [place];  Service: Plastics;  Laterality: Bilateral;   COLONOSCOPY WITH PROPOFOL N/A 08/21/2013   Procedure: COLONOSCOPY WITH PROPOFOL;  Surgeon: Cleotis Nipper, MD;  [facility];  Service: Endoscopy;  Laterality: N/A;   COLOSTOMY TAKEDOWN N/A 12/04/2014   Procedure: LAPROSCOPIC LYSIS OF ADHESIONS, SPLENIC MOBILIZATION, RELOCATION OF COLOSTOMY, DEBRIDEMENT INITIAL COLOSTOMY SITE;  Surgeon: Michael [name],  MD;  [facility];  Service: General;  Laterality: N/A;   CYSTECTOMY W/ URETEROILEAL CONDUIT  04/12/2019   @[facility] ;   INTESTINAL ANASTOMOSIS, PANNICULECTOMY, PELVIS EXENTENTION,  PARTIAL COLECTOMY REMOVAL ILEIUM WITH PERMNANT ILEOCOLOSTOMY   CYSTOGRAM N/A 06/01/2017   Procedure: CYSTOGRAM;  Surgeon: Alexis Frock, MD;  [facility];  Service: Urology;  Laterality: N/A;   CYSTOSCOPY W/ RETROGRADES Right 11/21/2015   Procedure: CYSTOSCOPY WITH RETROGRADE PYELOGRAM;  Surgeon: Alexis Frock, MD;  [facility];  Service: Urology;  Laterality: Right;   CYSTOSCOPY W/ URETERAL STENT PLACEMENT Right 11/21/2015   Procedure: CYSTOSCOPY WITH STENT REPLACEMENT;  Surgeon: Alexis Frock, MD;  [facility];  Service: Urology;  Laterality: Right;   CYSTOSCOPY W/ URETERAL STENT PLACEMENT Right 03/10/2016   Procedure: CYSTOSCOPY WITH STENT REPLACEMENT;  Surgeon: Alexis Frock, MD;  Location: [facility];  Service: Urology;  Laterality: Right;   CYSTOSCOPY W/ URETERAL STENT PLACEMENT Right 06/30/2016   Procedure: CYSTOSCOPY WITH RETROGRADE PYELOGRAM/URETERAL STENT EXCHANGE;  Surgeon: Alexis Frock, MD;  Location: [facility];  Service: Urology;  Laterality: Right;   CYSTOSCOPY W/ URETERAL STENT PLACEMENT N/A 06/01/2017   Procedure: CYSTOSCOPY WITH EXAM UNDER ANESTHESIA;  Surgeon: Alexis Frock, MD;  [facility];  Service: Urology;  Laterality: N/A;   CYSTOSCOPY W/ URETERAL STENT PLACEMENT Right 08/17/2017   Procedure: CYSTOSCOPY WITH RETROGRADE PYELOGRAM/URETERAL STENT REMOVAL;  Surgeon: Alexis Frock, MD;  Location: [facility];  Service: Urology;  Laterality: Right;   CYSTOSCOPY WITH RETROGRADE PYELOGRAM, URETEROSCOPY AND STENT PLACEMENT Right 03/20/2015   Procedure: CYSTOSCOPY WITH RETROGRADE PYELOGRAM, URETEROSCOPY WITH BALLOON DILATION AND STENT PLACEMENT ON RIGHT;  Surgeon: Alexis Frock, MD;  Location: [facility];   Service: Urology;  Laterality: Right;   CYSTOSCOPY WITH RETROGRADE PYELOGRAM, URETEROSCOPY AND STENT PLACEMENT Right 05/02/2015   Procedure: CYSTOSCOPY WITH RIGHT RETROGRADE PYELOGRAM,  DIAGNOSTIC URETEROSCOPY AND STENT PULL ;  Surgeon: Alexis Frock, MD;  Location: [facility];  Service: Urology;  Laterality: Right;   CYSTOSCOPY WITH RETROGRADE PYELOGRAM, URETEROSCOPY AND STENT PLACEMENT Right 09/05/2015   Procedure: CYSTOSCOPY WITH RETROGRADE PYELOGRAM,  AND STENT PLACEMENT;  Surgeon: Alexis Frock, MD;  [facility];  Service: Urology;  Laterality: Right;   CYSTOSCOPY WITH RETROGRADE PYELOGRAM, URETEROSCOPY AND STENT PLACEMENT Right 04/01/2017   Procedure: CYSTOSCOPY WITH RETROGRADE PYELOGRAM, URETEROSCOPY AND STENT PLACEMENT;  Surgeon: Alexis Frock, MD;  [facility];  Service: Urology;  Laterality: Right;   CYSTOSCOPY WITH STENT PLACEMENT Right 10/27/2016   Procedure: CYSTOSCOPY WITH STENT CHANGE and right retrograde pyelogram;  Surgeon: Alexis Frock, MD;  Location[facility];  Service: Urology;  Laterality: Right;   EUS N/A 10/02/2014   Procedure: LOWER ENDOSCOPIC ULTRASOUND (EUS);  Surgeon: Arta Silence, MD;  Location: [facility];  Service: Endoscopy;  Laterality: N/A;   EXCISION SOFT TISSUE MASS RIGHT FOREMAN  12/08/2006   EYE SURGERY  as child   pytosis of eyelids repair   INCISION AND DRAINAGE OF WOUND Bilateral 12/26/2015   Procedure: DEBRIDEMENT OF BILATERAL MASTECTOMY FLAPS;  Surgeon: Irene Limbo, MD;  Location: [place];  Service: Plastics;  Laterality: Bilateral;   IR CV LINE INJECTION  05/31/2017   IR FLUORO GUIDE CV LINE LEFT  05/31/2017   IR FLUORO GUIDE CV LINE RIGHT  04/06/2017   IR FLUORO GUIDE CV MIDLINE PICC RIGHT  05/30/2017   IR NEPHROSTOGRAM LEFT INITIAL PLACEMENT  09/02/2017   IR NEPHROSTOGRAM LEFT THRU EXISTING ACCESS  11/29/2017   IR NEPHROSTOGRAM RIGHT INITIAL PLACEMENT  09/02/2017   IR  NEPHROSTOGRAM RIGHT THRU EXISTING ACCESS  09/13/2017   IR NEPHROSTOGRAM RIGHT THRU EXISTING ACCESS  11/29/2017   IR NEPHROSTOMY EXCHANGE LEFT  11/28/2017   IR NEPHROSTOMY EXCHANGE LEFT  01/05/2018   IR NEPHROSTOMY EXCHANGE LEFT  02/16/2018   IR NEPHROSTOMY EXCHANGE LEFT  03/30/2018   IR NEPHROSTOMY EXCHANGE LEFT  05/12/2018   IR NEPHROSTOMY EXCHANGE LEFT  06/21/2018   IR NEPHROSTOMY EXCHANGE LEFT  08/04/2018   IR NEPHROSTOMY EXCHANGE LEFT  09/18/2018   IR NEPHROSTOMY EXCHANGE LEFT  10/09/2018   IR NEPHROSTOMY EXCHANGE LEFT  10/27/2018   IR NEPHROSTOMY EXCHANGE LEFT  11/21/2018   IR NEPHROSTOMY EXCHANGE LEFT  01/05/2019   IR NEPHROSTOMY EXCHANGE LEFT  02/15/2019   IR NEPHROSTOMY EXCHANGE LEFT  03/29/2019   IR NEPHROSTOMY EXCHANGE RIGHT  10/02/2017   IR NEPHROSTOMY EXCHANGE RIGHT  11/28/2017   IR NEPHROSTOMY EXCHANGE RIGHT  01/05/2018   IR NEPHROSTOMY EXCHANGE RIGHT  02/16/2018   IR NEPHROSTOMY EXCHANGE RIGHT  03/30/2018   IR NEPHROSTOMY EXCHANGE  RIGHT  05/12/2018   IR NEPHROSTOMY EXCHANGE RIGHT  06/21/2018   IR NEPHROSTOMY EXCHANGE RIGHT  08/04/2018   IR NEPHROSTOMY EXCHANGE RIGHT  09/18/2018   IR NEPHROSTOMY EXCHANGE RIGHT  10/27/2018   IR NEPHROSTOMY EXCHANGE RIGHT  11/21/2018   IR NEPHROSTOMY EXCHANGE RIGHT  01/05/2019   IR NEPHROSTOMY EXCHANGE RIGHT  02/15/2019   IR NEPHROSTOMY EXCHANGE RIGHT  03/29/2019   IR NEPHROSTOMY PLACEMENT LEFT  10/02/2017   IR RADIOLOGIST EVAL & MGMT  05/03/2017   IR US GUIDE VASC ACCESS LEFT  05/31/2017   IR US GUIDE VASC ACCESS RIGHT  04/06/2017   IR US GUIDE VASC ACCESS RIGHT  05/30/2017   LAPAROSCOPIC CHOLECYSTECTOMY  1990   LIPOSUCTION WITH LIPOFILLING Bilateral 04/16/2016   Procedure: LIPOSUCTION WITH LIPOFILLING TO BILATERAL CHEST;  Surgeon: Irene Limbo, MD;  Location: [place];  Service: Plastics;  Laterality: Bilateral;   MASTECTOMY W/ SENTINEL NODE BIOPSY Bilateral 12/11/2015   Procedure: RIGHT PROPHYLACTIC  MASTECTOMY, LEFT TOTAL MASTECTOMY WITH LEFT AXILLARY SENTINEL LYMPH NODE BIOPSY;  Surgeon: Stark Klein, MD;  Location: [place];  Service: General;  Laterality: Bilateral;   OSTOMY N/A 11/19/2014   Procedure: OSTOMY;  Surgeon: Michael [name], MD;  [facility];  Service: General;  Laterality: N/A;   PROCTOSCOPY N/A 04/01/2017   Procedure: RIDGE PROCTOSCOPY;  Surgeon: Michael [name], MD;  [facility];  Service: General;  Laterality: N/A;   REMOVAL OF BILATERAL TISSUE EXPANDERS WITH PLACEMENT OF BILATERAL BREAST IMPLANTS Bilateral 04/16/2016   Procedure: REMOVAL OF BILATERAL TISSUE EXPANDERS WITH PLACEMENT OF BILATERAL BREAST IMPLANTS;  Surgeon: Irene Limbo, MD;  Location: [place];  Service: Plastics;  Laterality: Bilateral;   ROBOTIC ASSISTED LAP VAGINAL HYSTERECTOMY N/A 11/19/2014   Procedure: ROBOTIC LYSIS OF ADHESIONS, CONVERTED TO LAPAROTOMY RADICAL UPPER VAGINECTOMY,LOW ANTERIOR BOWEL RESECTION, COLOSTOMY, BILATERAL URETERAL STENT PLACEMENT AND CYSTONOMY CLOSURE;  Surgeon: Everitt Amber, MD;  [facility];  Service: Gynecology;  Laterality: N/A;   TISSUE EXPANDER FILLING Bilateral 12/26/2015   Procedure: EXPANSION OF BILATERAL CHEST TISSUE EXPANDERS (60 mL- Right; 75 mL- Left);  Surgeon: Irene Limbo, MD;  Location: [place];  Service: Plastics;  Laterality: Bilateral;   TONSILLECTOMY     TOTAL ABDOMINAL HYSTERECTOMY  March 2006   [facility]   and Bilateral Salpingoophorectomy/  staging for Ovarian cancer/  an   VULVAR LESION REMOVAL N/A 08/19/2021   Procedure: VULVAR LESION with biopsies;  Surgeon: Lafonda Mosses, MD;  Location: [facility];  Service: Gynecology;  Laterality: N/A;   XI ROBOTIC ASSISTED LOWER ANTERIOR RESECTION N/A 04/01/2017   Procedure: XI ROBOTIC VS LAPAROSCOPIC COLOSTOMY TAKEDOWN WITH LYSIS OF ADHESIONS.;  Surgeon: Michael [name], MD;  [facility];  Service: General;  Laterality: N/A;  ERAS PATHWAY     Family History  Problem Relation Age of Onset   Cancer Mother 45       stomach ca   Hypertension Mother    Cancer Father 47       prostate ca   Diabetes Father    Heart disease Father        CABG   Hypertension Father    Hyperlipidemia Father    Obesity Father    Breast cancer Maternal Aunt        dx in her 7s   Lymphoma Paternal 49    Brain cancer Paternal Grandfather    Ovarian cancer Other    Diabetes Sister    Hypertension Brother y-10  Heart disease Brother        CABG   Diabetes Brother     Social History   Socioeconomic History   Marital status: Married    Spouse name: Armed forces technical officer   Number of children: 1   Years of education: Not on file   Highest education level: Not on file  Occupational History   Occupation: retired Therapist, 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[employer], sports - retired  Tobacco Use   Smoking status: Never   Smokeless tobacco: Never  Vaping Use   Vaping Use: Never used  Substance and Sexual Activity   Alcohol use: Not Currently   Drug use: No   Sexual activity: Not on file  Other Topics Concern   Not on file  Social History Narrative   Exercise-- has not gotten back into it since cancer came back   Social Determinants of Health   Financial Resource Strain: Not on file  Food Insecurity: Not on file  Transportation Needs: Not on file  Physical Activity: Not on file  Stress: Not on file  Social Connections: Not on file    Current Medications:  Current Outpatient Medications:    acetaminophen (TYLENOL) 500 MG tablet, Take 500 mg by mouth every 6 (six) hours as needed., Disp: , Rfl:    albuterol (PROVENTIL HFA) 108 (90 Base) MCG/ACT inhaler, Inhale 1 puff into the lungs every 6 (six) hours as needed for wheezing or shortness of breath. (Patient taking differently: Inhale 1 puff into the lungs every 6 (six) hours as needed for wheezing or shortness of breath.), Disp: 18 g, Rfl: 2   apixaban (ELIQUIS) 5 MG TABS tablet, Take 1 tablet (5 mg total) by  mouth 2 (two) times daily. (Patient taking differently: Take 5 mg by mouth daily.), Disp: 180 tablet, Rfl: 1   benzonatate (TESSALON) 100 MG capsule, Take 1 capsule (100 mg total) by mouth 3 (three) times daily as needed for cough. (Patient not taking: Reported on 08/18/2021), Disp: 30 capsule, Rfl: 0   Biotin 5 MG TABS, Take 5 mg by mouth daily. , Disp: , Rfl:    buPROPion (WELLBUTRIN SR) 150 MG 12 hr tablet, Take 1 tablet (150 mg total) by mouth 2 (two) times daily. (Patient taking differently: Take 150 mg by mouth daily.), Disp: 180 tablet, Rfl: 0   calcium citrate-vitamin D (CITRACAL+D) 315-200 MG-UNIT tablet, Take 1 tablet by mouth daily., Disp: , Rfl:    Cholecalciferol (VITAMIN D3) 10000 UNITS capsule, Take 10,000 Units by mouth See admin instructions. Take 1 tablet by mouth 43times per week., Disp: , Rfl:    clobetasol (OLUX) 0.05 % topical foam, Apply topically 2 (two) times daily. (Patient taking differently: Apply topically 2 (two) times daily as needed.), Disp: 50 g, Rfl: 3   diphenhydrAMINE (BENADRYL) 25 mg capsule, Take 1 capsule (25 mg total) by mouth every 8 (eight) hours as needed for itching, allergies or sleep., Disp: 30 capsule, Rfl: 0   famotidine-calcium carbonate-magnesium hydroxide (PEPCID COMPLETE) 10-800-165 MG chewable tablet, Chew 1 tablet by mouth as needed., Disp: , Rfl:    ferrous sulfate 325 (65 FE) MG tablet, Take 1 tablet (325 mg total) by mouth at bedtime. (Patient taking differently: Take 325 mg by mouth at bedtime.), Disp: 30 tablet, Rfl: 3   fluticasone (FLONASE) 50 MCG/ACT nasal spray, SHAKE LIQUID AND USE 2 SPRAYS IN EACH NOSTRIL DAILY (Patient not taking: Reported on 08/18/2021), Disp: 48 g, Rfl: 0   HYDROcodone bit-homatropine (HYCODAN) 5-1.5 MG/5ML syrup,  Take 5 mLs by mouth every 6 (six) hours as needed for cough. (Patient not taking: Reported on 08/18/2021), Disp: 120 mL, Rfl: 0   levothyroxine (SYNTHROID, LEVOTHROID) 150 MCG tablet, Take 1 tablet (150 mcg total)  by mouth daily before breakfast. (Patient taking differently: Take 150 mcg by mouth as directed. ONE DAILY WITH EXCEPTION ON SUDAY'S TAKE 2 TABS), Disp: 30 tablet, Rfl: 1   loratadine (CLARITIN) 10 MG tablet, Take 10 mg by mouth daily. , Disp: , Rfl:    Melatonin 10 MG TABS, Take 1 tablet by mouth at bedtime., Disp: , Rfl:    omega-3 acid ethyl esters (LOVAZA) 1 G capsule, Take 1 g by mouth 2 (two) times daily. , Disp: , Rfl:    Polyethyl Glycol-Propyl Glycol (SYSTANE OP), Place 1 drop into both eyes daily as needed (dry eyes). , Disp: , Rfl:    pregabalin (LYRICA) 50 MG capsule, Take 1 capsule (50 mg total) by mouth 2 (two) times daily. (Patient taking differently: Take 50 mg by mouth 2 (two) times daily.), Disp: 60 capsule, Rfl: 5   Prenatal Vit-Fe Fumarate-FA (PRENATAL VITAMIN PO), Take 1 capsule by mouth daily. Takes prenatal because there are no dyes in it, Disp: , Rfl:    Probiotic Product (ALIGN PO), Take 1 capsule by mouth daily., Disp: , Rfl:    rifaximin (XIFAXAN) 550 MG TABS tablet, Take 550 mg by mouth See admin instructions. Take one tablet by mouth three times daily for 14 days when needed. (Patient not taking: Reported on 08/18/2021), Disp: , Rfl:    rosuvastatin (CRESTOR) 10 MG tablet, Take 10 mg by mouth every evening., Disp: , Rfl:    Saccharomyces boulardii (FLORASTOR PO), Take 1 capsule by mouth at bedtime., Disp: , Rfl:    Semaglutide, 1 MG/DOSE, 4 MG/3ML SOPN, Inject 1 mg as directed once a week. (Patient taking differently: Inject 1 mg as directed once a week. Saturday'S), Disp: 9 mL, Rfl: 0   Tazarotene (ARAZLO) 0.045 % LOTN, Apply 1 application topically daily., Disp: 45 g, Rfl: 0   Tbo-Filgrastim (GRANIX) 480 MCG/0.8ML SOSY injection, 480 mcg subcutaneous injection every 6 days life long, Disp: 11.2 mL, Rfl: 11   tiZANidine (ZANAFLEX) 4 MG tablet, Take 0.5-1 tablets (2-4 mg total) by mouth every 8 (eight) hours as needed for muscle spasms., Disp: 60 tablet, Rfl: 1  Review of  Symptoms: Pertinent positives as per HPI.  Physical Exam: There were no vitals taken for this visit. Deferred given limitations of phone visit.  Laboratory & Radiologic Studies: CT A/P on 1/24: IMPRESSION: No acute findings. No evidence of recurrent or metastatic carcinoma within the abdomen or pelvis.   Stable small benign left adrenal adenoma.   Assessment & Plan: Hillary Struss Romano is a 66 y.o. woman with history of recurrent endometrioid endometrial/ovarian cancer.   S/p exploratory laparotomy, posterior supralevator pelvic exenteration, end colostomy, bladder repair, ureteral stenting with complete resection and negative margins on 11/09/14. S/p adjuvant radiation completed 03/10/15. Disease free since this time.  Recent surgery performed for excision of left vulvar/groin mass, benign findings.  Biopsy from the vagina shows no evidence of recurrent disease. CT scan on 1/24 shows no evidence of recurrent disease.  Patient has had significant improvement of her skin symptoms.  Discussed doing another week of antibiotics.  She has also had almost complete resolution of her vaginal bleeding, which seems to be in response to the doxycycline.  She remembers something similar happening last time she was prescribed doxycycline.  Overall, her description of the small incision does not sound consistent with an infection.  She will keep me posted if she develops any new or more concerning findings.  Reviewed again with her CT findings.  I do not think that this area is amenable to embolization but I do think it would be worthwhile having her be seen for consultation at the wound clinic.  Referral was placed today.  We discussed follow-up in approximately 6 months, in late July.  The patient was asked to call back in late May or early June to schedule a visit with me as my schedule is not out that far.  I discussed the assessment and treatment plan with the patient. The patient was provided  with an opportunity to ask questions and all were answered. The patient agreed with the plan and demonstrated an understanding of the instructions.   The patient was advised to call back or see an in-person evaluation if the symptoms worsen or if the condition fails to improve as anticipated.   24 minutes of total time was spent for this patient encounter, including preparation, face-to-face counseling with the patient and coordination of care, and documentation of the encounter.   Jeral Pinch, MD  Division of Gynecologic Oncology  Department of Obstetrics and Gynecology  [facility]

## 2021-08-31 DIAGNOSIS — E872 Acidosis, unspecified: Secondary | ICD-10-CM | POA: Diagnosis not present

## 2021-08-31 DIAGNOSIS — N1832 Chronic kidney disease, stage 3b: Secondary | ICD-10-CM | POA: Diagnosis not present

## 2021-08-31 DIAGNOSIS — E119 Type 2 diabetes mellitus without complications: Secondary | ICD-10-CM | POA: Diagnosis not present

## 2021-08-31 DIAGNOSIS — I10 Essential (primary) hypertension: Secondary | ICD-10-CM | POA: Diagnosis not present

## 2021-09-03 ENCOUNTER — Other Ambulatory Visit: Payer: Self-pay

## 2021-09-03 ENCOUNTER — Encounter (HOSPITAL_BASED_OUTPATIENT_CLINIC_OR_DEPARTMENT_OTHER): Payer: BC Managed Care – PPO | Attending: Internal Medicine | Admitting: Internal Medicine

## 2021-09-03 DIAGNOSIS — N950 Postmenopausal bleeding: Secondary | ICD-10-CM | POA: Diagnosis not present

## 2021-09-03 DIAGNOSIS — E119 Type 2 diabetes mellitus without complications: Secondary | ICD-10-CM | POA: Diagnosis not present

## 2021-09-03 DIAGNOSIS — Z8543 Personal history of malignant neoplasm of ovary: Secondary | ICD-10-CM | POA: Diagnosis not present

## 2021-09-03 DIAGNOSIS — D709 Neutropenia, unspecified: Secondary | ICD-10-CM | POA: Insufficient documentation

## 2021-09-03 DIAGNOSIS — C549 Malignant neoplasm of corpus uteri, unspecified: Secondary | ICD-10-CM | POA: Diagnosis not present

## 2021-09-03 DIAGNOSIS — C50212 Malignant neoplasm of upper-inner quadrant of left female breast: Secondary | ICD-10-CM | POA: Insufficient documentation

## 2021-09-03 DIAGNOSIS — C541 Malignant neoplasm of endometrium: Secondary | ICD-10-CM | POA: Diagnosis not present

## 2021-09-03 DIAGNOSIS — Z9013 Acquired absence of bilateral breasts and nipples: Secondary | ICD-10-CM | POA: Diagnosis not present

## 2021-09-03 NOTE — Progress Notes (Signed)
[name], [name] ([id number]) Visit Report for 09/03/2021 Abuse Risk Screen Details Patient Name: Date of Service: [name], [name] 09/03/2021 8:00 A M Medical Record Number: [id number] Patient Account Number: [id number] Date of Birth/Sex: Treating RN: 01/07/1956 (66 y.o. [name] Primary Care [name]: [name] Other Clinician: Referring [name]: Treating [name]/Extender: [name] [name]: 0 Abuse Risk Screen Items Answer ABUSE RISK SCREEN: Has anyone close to you tried to hurt or harm you recentlyo No Do you feel uncomfortable with anyone in your familyo No Has anyone forced you do things that you didnt want to doo No Electronic Signature(s) Signed: 09/03/2021 4:09:06 PM By: Deon Pilling RN, BSN Entered By: Deon Pilling on 09/03/2021 08:20:19 -------------------------------------------------------------------------------- Activities of Daily Living Details Patient Name: Date of Service: [name], [name] [name] 09/03/2021 8:00 A M Medical Record Number: [id number] Patient Account Number: [id number] Date of Birth/Sex: Treating RN: 04/14/1956 (66 y.o. [name] Primary Care [name]: [name] Other Clinician: Referring Nolah Krenzer: Treating [name]/Extender: [name] [name] [name]: 0 Activities of Daily Living Items Answer Activities of Daily Living (Please select one for each item) Drive Automobile Completely Able T Medications ake Completely Able Use T elephone Completely Able Care for Appearance Completely Able Use T oilet Completely Able Bath / Shower Completely Able Dress Self Completely Able Feed Self Completely Able Walk Completely Able Get In / Out Bed Completely Able Housework Completely Able Prepare Meals Completely [place] Completely Able Shop for Self Completely Able Electronic Signature(s) Signed: 09/03/2021 4:09:06 PM By: Deon Pilling RN, BSN Entered  By: Deon Pilling on 09/03/2021 08:20:43 -------------------------------------------------------------------------------- Education Screening Details Patient Name: Date of Service: [name], [name] [name] 09/03/2021 8:00 A M Medical Record Number: [id number] Patient Account Number: [id number] Date of Birth/Sex: Treating RN: 06/30/1956 (66 y.o. [name], [name] Primary Care [name]: [name] Other Clinician: Referring Kasaundra Fahrney: Treating Leodan Bolyard/Extender: [name] in Treatment: 0 Primary Learner Assessed: Patient Learning Preferences/Education Level/Primary Language Learning Preference: Explanation, Demonstration, Printed Material Highest Education Level: College or Above Preferred Language: English Cognitive Barrier Language Barrier: No Translator Needed: No Memory Deficit: No Emotional Barrier: No Cultural/Religious Beliefs Affecting Medical Care: No Physical Barrier Impaired Vision: Yes Glasses Impaired Hearing: No Decreased Hand dexterity: No Knowledge/Comprehension Knowledge Level: High Comprehension Level: High Ability to understand written instructions: High Ability to understand verbal instructions: High Motivation Anxiety Level: Calm Cooperation: Cooperative Education Importance: Acknowledges Need Interest in Health Problems: Asks Questions Perception: Coherent Willingness to Engage in Self-Management High Activities: Readiness to Engage in Self-Management High Activities: Electronic Signature(s) Signed: 09/03/2021 4:09:06 PM By: Deon Pilling RN, BSN Entered By: Deon Pilling on 09/03/2021 08:21:03 -------------------------------------------------------------------------------- Fall Risk Assessment Details Patient Name: Date of Service: [name], [name] [name] 09/03/2021 8:00 A M Medical Record Number: [id number] Patient Account Number: [id number] Date of Birth/Sex: Treating RN: Dec 06, 1955 (66 y.o. [name], [name] Primary Care  [name]: [name] Other Clinician: Referring [name]: Treating [name]/Extender: [name] [name]: 0 Fall Risk Assessment Items Have you had 2 or more falls in the last 12 monthso 0 No Have you had any fall that resulted in injury in the last 12 monthso 0 Yes FALLS RISK SCREEN History of falling - immediate or within 3 months 25 Yes Secondary diagnosis (Do you have 2 or more medical diagnoseso) 0 No Ambulatory aid None/bed rest/wheelchair/nurse 0 Yes Crutches/cane/walker 0 No Furniture 0 No Intravenous therapy Access/Saline/Heparin  Lock 0 No Gait/Transferring Normal/ bed rest/ wheelchair 0 Yes Weak (short steps with or without shuffle, stooped but able to lift head while walking, may seek 0 No support from furniture) Impaired (short steps with shuffle, may have difficulty arising from chair, head down, impaired 0 No balance) Mental Status Oriented to own ability 0 Yes Electronic Signature(s) Signed: 09/03/2021 4:09:06 PM By: Deon Pilling RN, BSN Entered By: Deon Pilling on 09/03/2021 08:21:19 -------------------------------------------------------------------------------- Foot Assessment Details Patient Name: Date of Service: [name], [name] [name] 09/03/2021 8:00 A M Medical Record Number: [id number] Patient Account Number: [id number] Date of Birth/Sex: Treating RN: June 20, 1956 (66 y.o. [name] Primary Care [name]: [name] Other Clinician: Referring [name]: Treating [name]/Extender: [name] [name]: 0 Foot Assessment Items Site Locations + = Sensation present, - = Sensation absent, C = Callus, U = Ulcer R = Redness, W = Warmth, M = Maceration, PU = Pre-ulcerative lesion F = Fissure, S = Swelling, D = Dryness Assessment Right: Left: Other Deformity: No No Prior Foot Ulcer: No No Prior Amputation: No No Charcot Joint: No No Ambulatory Status: Ambulatory  Without Help Gait: Steady Notes NO BLE wounds. Electronic Signature(s) Signed: 09/03/2021 4:09:06 PM By: Deon Pilling RN, BSN Entered By: Deon Pilling on 09/03/2021 08:21:42 -------------------------------------------------------------------------------- Nutrition Risk Screening Details Patient Name: Date of Service: [name], [name] [name] 09/03/2021 8:00 A M Medical Record Number: [id number] Patient Account Number: [id number] Date of Birth/Sex: Treating RN: 10-15-1955 (66 y.o. [name], [name] Primary Care Nuha Degner: [name] Other Clinician: Referring Taneasha Fuqua: Treating Sonali Wivell/Extender: [name] [name]: 0 Height (in): 62 Weight (lbs): 160 Body Mass Index (BMI): 29.3 Nutrition Risk Screening Items Score Screening NUTRITION RISK SCREEN: I have an illness or condition that made me change the kind and/or amount of food I eat 2 Yes I eat fewer than two meals per day 0 No I eat few fruits and vegetables, or milk products 0 No I have three or more drinks of beer, liquor or wine almost every day 0 No I have tooth or mouth problems that make it hard for me to eat 0 No I don't always have enough money to buy the food I need 0 No I eat alone most of the time 0 No I take three or more different prescribed or over-the-counter drugs a day 1 Yes Without wanting to, I have lost or gained 10 pounds in the last six months 0 No I am not always physically able to shop, cook and/or feed myself 0 No Nutrition Protocols Good Risk Protocol Provide education on elevated blood Moderate Risk Protocol 0 sugars and impact on wound healing, as applicable High Risk Proctocol Risk Level: Moderate Risk Score: 3 Electronic Signature(s) Signed: 09/03/2021 4:09:06 PM By: Deon Pilling RN, BSN Entered By: Deon Pilling on 09/03/2021 08:21:30

## 2021-09-03 NOTE — Progress Notes (Signed)
[name], [name] ([id number]) Visit Report for 09/03/2021 Allergy List Details Patient Name: Date of Service: [place], [name] 09/03/2021 8:00 A M Medical Record Number: [id number] Patient Account Number: [id number] Date of Birth/Sex: Treating RN: 24-Feb-1956 (66 y.o. [name] Primary Care Neal Oshea: [name] Other Clinician: Referring Yitzchak Kothari: Treating Timouthy Gilardi/Extender: Wyvonna Plum, KA THERINE [name]: 0 Allergies Active Allergies penicillin Reaction: hives, swelling cefaclor Reaction: rash erythromycin base Reaction: gastritis, abd cramps adhesive tape Reaction: blisters Ultram Reaction: hives trimethoprim Reaction: rash Zarxio Reaction: intolerance Cephalosporins Reaction: rash fluconazole Reaction: rash neomycin Reaction: blisters oxycodone Reaction: intolerance pectin Reaction: rash Septra Reaction: rash Sulfa (Sulfonamide Antibiotics) Reaction: rash Allergy Notes Electronic Signature(s) Signed: 09/03/2021 4:09:06 PM By: Deon Pilling RN, BSN Entered By: Deon Pilling on 09/03/2021 08:55:44 -------------------------------------------------------------------------------- Arrival Information Details Patient Name: Date of Service: [name], [name] 09/03/2021 8:00 A M Medical Record Number: [id number] Patient Account Number: [id number] Date of Birth/Sex: Treating RN: 1956/01/09 (66 y.o. [name] Primary Care Christia Domke: [name] Other Clinician: Referring Ruddy Swire: Treating Patricia Perales/Extender: Janne Napoleon in Treatment: 0 Visit Information Patient Arrived: Ambulatory Arrival Time: 08:05 Accompanied By: self Transfer Assistance: None Patient Identification Verified: Yes Secondary Verification Process Completed: Yes Patient Requires Transmission-Based Precautions: No Patient Has Alerts: Yes Patient Alerts: Patient on Blood Thinner Notes Per patient arrived for evaluation of  vaginal and rectal bleeding for possibility of hyberbaric oxygen therapy. Per patient radiation 2016. Electronic Signature(s) Signed: 09/03/2021 4:09:06 PM By: Deon Pilling RN, BSN Entered By: Deon Pilling on 09/03/2021 08:23:00 -------------------------------------------------------------------------------- Clinic Level of Care Assessment Details Patient Name: Date of Service: [name], [name] 09/03/2021 8:00 A M Medical Record Number: [id number] Patient Account Number: [id number] Date of Birth/Sex: Treating RN: [birth year] (66 y.o. [name], Meta.Reding Primary Care Jaskiran Pata: [name] Other Clinician: Referring Sayyid Harewood: Treating Thurma Priego/Extender: Wyvonna Plum, KA THERINE [name]: 0 Clinic Level of Care Assessment Items TOOL 4 Quantity Score X- 1 0 Use when only an EandM is performed on FOLLOW-UP visit ASSESSMENTS - Nursing Assessment / Reassessment X- 1 10 Reassessment of Co-morbidities (includes updates in patient status) X- 1 5 Reassessment of Adherence to Treatment Plan ASSESSMENTS - Wound and Skin A ssessment / Reassessment []  - 0 Simple Wound Assessment / Reassessment - one wound []  - 0 Complex Wound Assessment / Reassessment - multiple wounds []  - 0 Dermatologic / Skin Assessment (not related to wound area) ASSESSMENTS - Focused Assessment []  - 0 Circumferential Edema Measurements - multi extremities []  - 0 Nutritional Assessment / Counseling / Intervention []  - 0 Lower Extremity Assessment (monofilament, tuning fork, pulses) []  - 0 Peripheral Arterial Disease Assessment (using hand held doppler) ASSESSMENTS - Ostomy and/or Continence Assessment and Care []  - 0 Incontinence Assessment and Management []  - 0 Ostomy Care Assessment and Management (repouching, etc.) PROCESS - Coordination of Care X - Simple Patient / Family Education for ongoing care 1 15 []  - 0 Complex (extensive) Patient / Family Education for ongoing care X- 1  10 Staff obtains Programmer, systems, Records, T Results / Process Orders est []  - 0 Staff telephones HHA, Nursing Homes / Clarify orders / etc []  - 0 Routine Transfer to another Facility (non-emergent condition) []  - 0 Routine Hospital Admission (non-emergent condition) X- 1 15 New Admissions / Biomedical engineer / Ordering NPWT Apligraf, etc. , []  - 0 Emergency Hospital Admission (emergent condition) X- 1 10 Simple Discharge Coordination []  - 0 Complex (extensive) Discharge Coordination  PROCESS - Special Needs []  - 0 Pediatric / Minor Patient Management []  - 0 Isolation Patient Management []  - 0 Hearing / Language / Visual special needs []  - 0 Assessment of Community assistance (transportation, D/C planning, etc.) []  - 0 Additional assistance / Altered mentation []  - 0 Support Surface(s) Assessment (bed, cushion, seat, etc.) INTERVENTIONS - Wound Cleansing / Measurement []  - 0 Simple Wound Cleansing - one wound []  - 0 Complex Wound Cleansing - multiple wounds []  - 0 Wound Imaging (photographs - any number of wounds) []  - 0 Wound Tracing (instead of photographs) []  - 0 Simple Wound Measurement - one wound []  - 0 Complex Wound Measurement - multiple wounds INTERVENTIONS - Wound Dressings []  - 0 Small Wound Dressing one or multiple wounds []  - 0 Medium Wound Dressing one or multiple wounds []  - 0 Large Wound Dressing one or multiple wounds []  - 0 Application of Medications - topical []  - 0 Application of Medications - injection INTERVENTIONS - Miscellaneous []  - 0 External ear exam []  - 0 Specimen Collection (cultures, biopsies, blood, body fluids, etc.) []  - 0 Specimen(s) / Culture(s) sent or taken to Lab for analysis []  - 0 Patient Transfer (multiple staff / Civil Service fast streamer / Similar devices) []  - 0 Simple Staple / Suture removal (25 or less) []  - 0 Complex Staple / Suture removal (26 or more) []  - 0 Hypo / Hyperglycemic Management (close monitor of Blood  Glucose) []  - 0 Ankle / Brachial Index (ABI) - do not check if billed separately X- 1 5 Vital Signs Has the patient been seen at the hospital within the last three years: Yes Total Score: 70 Level Of Care: New/Established - Level 2 Electronic Signature(s) Signed: 09/03/2021 4:09:06 PM By: Deon Pilling RN, BSN Entered By: Deon Pilling on 09/03/2021 13:07:11 -------------------------------------------------------------------------------- Encounter Discharge Information Details Patient Name: Date of Service: [name], [name] 09/03/2021 8:00 A M Medical Record Number: [id number] Patient Account Number: [id number] Date of Birth/Sex: Treating RN: 01-03-1956 (66 y.o. [name] Primary Care Roselina Burgueno: [name] Other Clinician: Referring Mccade Sullenberger: Treating Rodrecus Belsky/Extender: Janne Napoleon in Treatment: 0 Encounter Discharge Information Items Discharge Condition: Stable Ambulatory Status: Ambulatory Discharge Destination: Home Transportation: Private Auto Accompanied By: self Schedule Follow-up Appointment: No Clinical Summary of Care: Notes Explained to patient would be gathering all her information and documentation to support hyberbarics and will call once we have insurance approval to start if able. Patient in agreement. Electronic Signature(s) Signed: 09/03/2021 4:09:06 PM By: Deon Pilling RN, BSN Entered By: Deon Pilling on 09/03/2021 13:08:16 -------------------------------------------------------------------------------- Lower Extremity Assessment Details Patient Name: Date of Service: [name], [name] 09/03/2021 8:00 A M Medical Record Number: [id number] Patient Account Number: [id number] Date of Birth/Sex: Treating RN: 04/07/1956 (66 y.o. [name] Primary Care Zyah Gomm: [name] Other Clinician: Referring Smrithi Pigford: Treating Filimon Miranda/Extender: Wyvonna Plum, KA THERINE [name]:  0 Electronic Signature(s) Signed: 09/03/2021 4:09:06 PM By: Deon Pilling RN, BSN Entered By: Deon Pilling on 09/03/2021 08:21:46 -------------------------------------------------------------------------------- Multi Wound Chart Details Patient Name: Date of Service: [name], [name] 09/03/2021 8:00 A M Medical Record Number: [id number] Patient Account Number: [id number] Date of Birth/Sex: Treating RN: 29-Nov-1955 (66 y.o. [name] Primary Care Galya Dunnigan: [name] Other Clinician: Referring Zeus Marquis: Treating Rylei Codispoti/Extender: Wyvonna Plum, KA THERINE [name]: 0 Vital Signs Height(in): 62 Pulse(bpm): 80 Weight(lbs): 160 Blood Pressure(mmHg): 107/70 Body Mass Index(BMI): 29.3 Temperature(F): 98.3 Respiratory Rate(breaths/min): 20 Wound  Assessments Treatment Notes Electronic Signature(s) Signed: 09/03/2021 12:11:13 PM By: [name] DO Signed: 09/03/2021 4:09:06 PM By: Deon Pilling RN, BSN Entered By: [name] on 09/03/2021 10:25:39 -------------------------------------------------------------------------------- Multi-Disciplinary Care Plan Details Patient Name: Date of Service: HO [name], [name] 09/03/2021 8:00 A M Medical Record Number: [id number] Patient Account Number: [id number] Date of Birth/Sex: Treating RN: 12-31-1955 (66 y.o. [name] Primary Care Keslie Gritz: [name] Other Clinician: Referring Earlean Fidalgo: Treating Nevae Pinnix/Extender: Marga Hoots [name]: 0 Active Inactive Orientation to the Wound Care Program Nursing Diagnoses: Knowledge deficit related to the wound healing center program Goals: Patient/caregiver will verbalize understanding of the [facility] Date Initiated: 09/03/2021 Target Resolution Date: 10/09/2021 Goal Status: Active Interventions: Provide education on orientation to the wound center Notes: Electronic  Signature(s) Signed: 09/03/2021 4:09:06 PM By: Deon Pilling RN, BSN Entered By: Deon Pilling on 09/03/2021 13:06:16 -------------------------------------------------------------------------------- Pain Assessment Details Patient Name: Date of Service: [name], [name] 09/03/2021 8:00 A M Medical Record Number: [id number] Patient Account Number: [id number] Date of Birth/Sex: Treating RN: [birth year] (66 y.o. [name] Primary Care Donyae Kilner: [name] Other Clinician: Referring Tamea Bai: Treating Sharlette Jansma/Extender: Ventura Bruns THERINE [name]: 0 Active Problems Location of Pain Severity and Description of Pain Patient Has Paino No Site Locations Rate the pain. Current Pain Level: 0 Pain Management and Medication Current Pain Management: Medication: No Cold Application: No Rest: No Massage: No Activity: No T.E.N.S.: No Heat Application: No Leg drop or elevation: No Is the Current Pain Management Adequate: Adequate How does your wound impact your activities of daily livingo Sleep: No Bathing: No Appetite: No Relationship With Others: No Bladder Continence: No Emotions: No Bowel Continence: No Work: No Toileting: No Drive: No Dressing: No Hobbies: No Engineer, maintenance) Signed: 09/03/2021 4:09:06 PM By: Deon Pilling RN, BSN Entered By: Deon Pilling on 09/03/2021 08:56:00 -------------------------------------------------------------------------------- Patient/Caregiver Education Details Patient Name: Date of Service: HO [name], [name] 2/2/2023andnbsp8:00 A M Medical Record Number: [id number] Patient Account Number: [id number] Date of Birth/Gender: Treating RN: 1956-02-05 (66 y.o. [name] Primary Care Physician: [name] Other Clinician: Referring Physician: Treating Physician/Extender: Janne Napoleon in Treatment: 0 Education Assessment Education Provided  To: Patient Education Topics Provided Hyperbaric Oxygenation: Handouts: Hyperbaric Oxygen Methods: Explain/Verbal Responses: Reinforcements needed Electronic Signature(s) Signed: 09/03/2021 4:09:06 PM By: Deon Pilling RN, BSN Entered By: Deon Pilling on 09/03/2021 13:06:25 -------------------------------------------------------------------------------- Vitals Details Patient Name: Date of Service: [name], [name] 09/03/2021 8:00 A M Medical Record Number: [id number] Patient Account Number: [id number] Date of Birth/Sex: Treating RN: Jul 13, 1956 (66 y.o. [name], Meta.Reding Primary Care Graciella Arment: [name] Other Clinician: Referring Ayat Drenning: Treating Iana Buzan/Extender: Wyvonna Plum, KA THERINE [name]: 0 Vital Signs Time Taken: 08:05 Temperature (F): 98.3 Height (in): 62 Pulse (bpm): 80 Source: Stated Respiratory Rate (breaths/min): 20 Weight (lbs): 160 Blood Pressure (mmHg): 107/70 Source: Stated Reference Range: 80 - 120 mg / dl Body Mass Index (BMI): 29.3 Electronic Signature(s) Signed: 09/03/2021 4:09:06 PM By: Deon Pilling RN, BSN Entered By: Deon Pilling on 09/03/2021 53:20:23

## 2021-09-03 NOTE — Progress Notes (Addendum)
ELLEY, HARP ([id number]) Visit Report for 09/03/2021 Chief Complaint Document Details Patient Name: Date of Service: [name], [name] B. 09/03/2021 8:00 A M Medical Record Number: [id number] Patient Account Number: [id number] Date of Birth/Sex: Treating RN: [birth year] (66 y.o. 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 Electronic Signature(s) Signed: 09/03/2021 12:11:13 PM By: [name] DO Entered By: [name] on 09/03/2021 10:28:14 -------------------------------------------------------------------------------- HPI Details Patient Name: Date of Service: [name] [name], [name] [name] 09/03/2021 8:00 A M Medical Record Number: [id number] Patient Account Number: [id number] Date of Birth/Sex: Treating RN: May 30, 1956 (66 y.o. [name] Primary Care Provider: [name] Other Clinician: Referring Provider: Treating Provider/Extender: Marga Hoots [name]: 0 History of Present Illness HPI Description: 09/03/2021 Patient has a history of ovarian cancer in 2006 treated with chemotherapy carbo/T axol. In 2016 she had an MRI that showed a pelvic mass without invasion into the bladder but abutting to the rectum. 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 She had a radical upper vaginectomy and low anterior resection with colostomy. From 01/29/2015 to 03/10/2015 she received adjunct of radiation. 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 She reports that since her radiation she has also developed vaginal and rectal bleeding It is chronic but improves with the use of doxycycline. She also has a history of diagnosed invasive ductal carcinoma of the left breast in 2017 and underwent bilateral mastectomy. Electronic Signature(s) Signed: 09/03/2021 12:11:13 PM By: [name] DO Entered By: [name] on 09/03/2021 10:58:15 -------------------------------------------------------------------------------- Physical Exam Details Patient Name: Date of Service: [name] [name], [name] [name] 09/03/2021 8:00 A M Medical Record Number: [id number] Patient Account Number: [id number] Date of Birth/Sex: Treating RN: [birth year] (66 y.o. [name] Primary Care Provider: [name] Other Clinician: Referring Provider: Treating Provider/Extender: Wyvonna Plum, KA THERINE [name]: 0 Constitutional respirations regular, non-labored and within target range for patient.[name] Kitchen Respiratory clear to auscultation bilaterally. Cardiovascular regular rate and rhythm with normal S1, S2. Gastrointestinal (GI) soft, non-tender, non-distended, +BS. Psychiatric pleasant and cooperative. Electronic Signature(s) Signed: 09/03/2021 12:11:13 PM By: [name] DO Entered By: [name] on 09/03/2021 10:58:46 -------------------------------------------------------------------------------- Physician Orders Details Patient Name: Date of Service: [name] [name], [name] [name] 09/03/2021 8:00 A M Medical Record Number: [id number] Patient Account Number: [id number] Date of Birth/Sex: Treating RN: Apr 14, 1956 (66 y.o. [name] Primary Care Provider: [name] Other Clinician: Referring Provider: Treating Provider/Extender: Wyvonna Plum, KA THERINE Weeks in  Treatment: 0 Verbal / Phone Orders: No Diagnosis Coding Follow-up Appointments Other: - Will call you once we are able to approve or deny hyberbarics. Hyperbaric Oxygen Therapy Evaluate for [name] Therapy Indication: - Soft tissue injury from radiation If appropriate for treatment, begin HBOT per protocol: 2.0 ATA for 90 Minutes with 2 Five (5) Minute A Breaks ir Total Number of Treatments: - 40 One treatments per day (delivered Monday through Friday unless otherwise specified in Special Instructions below): Finger stick Blood Glucose  Pre- and Post- HBOT Treatment. Follow Hyperbaric Oxygen Glycemia Protocol A frin (Oxymetazoline HCL) 0.05% nasal spray - 1 spray in both nostrils daily as needed prior to [name] treatment for difficulty clearing ears GLYCEMIA INTERVENTIONS PROTOCOL PRE-[name] GLYCEMIA INTERVENTIONS ACTION INTERVENTION Obtain pre-[name] capillary blood glucose (ensure 1 physician order is in chart). A. Notify [name] physician and await physician orders. 2 If result is 70 mg/dl or below: B. If the result meets the hospital definition of a critical result, follow hospital policy. A. Give patient an 8 ounce Glucerna Shake, an 8 ounce Ensure, or 8 ounces of a Glucerna/Ensure equivalent dietary supplement*. B. Wait 30 minutes. If result is 71 mg/dl to 130 mg/dl: C. Retest patients capillary blood glucose (CBG). D. If result greater than or equal to 110 mg/dl, proceed with [name]. If result less than 110 mg/dl, notify [name] physician and consider holding [name]. If result is 131 mg/dl to 249 mg/dl: A. Proceed with [name]. A. Notify [name] physician and await physician orders. B. It is recommended to hold [name] and do If result is 250 mg/dl or greater: blood/urine ketone testing. C. If the result meets the hospital definition of a critical result, follow hospital policy. POST-[name] GLYCEMIA INTERVENTIONS ACTION INTERVENTION Obtain post [name] capillary blood glucose (ensure 1 physician order is  in chart). A. Notify [name] physician and await physician orders. 2 If result is 70 mg/dl or below: B. If the result meets the hospital definition of a critical result, follow hospital policy. A. Give patient an 8 ounce Glucerna Shake, an 8 ounce Ensure, or 8 ounces of a Glucerna/Ensure equivalent dietary supplement*. B. Wait 15 minutes for symptoms of If result is 71 mg/dl to 100 mg/dl: hypoglycemia (i.e. nervousness, anxiety, sweating, chills, clamminess, irritability, confusion, tachycardia or dizziness). C. If patient asymptomatic, discharge patient. If patient symptomatic, repeat capillary blood glucose (CBG) and notify [name] physician. If result is 101 mg/dl to 249 mg/dl: A. Discharge patient. A. Notify [name] physician and await physician orders. B. It is recommended to do blood/urine ketone If result is 250 mg/dl or greater: testing. C. If the result meets the hospital definition of a critical result, follow hospital policy. *Juice or candies are NOT equivalent products. If patient refuses the Glucerna or Ensure, please consult the hospital dietitian for an appropriate substitute. Electronic Signature(s) Signed: 09/15/2021 4:44:26 PM By: [name] DO Previous Signature: 09/03/2021 12:11:13 PM Version By: [name] DO Entered By: [name] on 09/15/2021 16:43:10 -------------------------------------------------------------------------------- Problem List Details Patient Name: Date of Service: [name] [name], [name] [name] 09/03/2021 8:00 A M Medical Record Number: [id number] Patient Account Number: [id number] Date of Birth/Sex: Treating RN: [birth year] (66 y.o. [name] Primary Care Provider: [name] Other Clinician: Referring Provider: Treating Provider/Extender: Wyvonna Plum, KA THERINE [name]: 0 Active Problems ICD-10 Encounter Code Description Active Date MDM Diagnosis D70.9 Neutropenia, unspecified 09/03/2021 No  Yes E11.9 Type 2 diabetes mellitus without complications 08/03/9415 No Yes 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 09/03/2021 No Yes C50.212 Malignant neoplasm of upper-inner quadrant of left female breast 09/03/2021 No Yes 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, unspecified 09/03/2021 No Yes N95.0 Postmenopausal bleeding 09/03/2021 No Yes Inactive Problems Resolved Problems Electronic Signature(s) Signed: 09/03/2021 12:11:13 PM By: [name] DO Entered By: [name] on 09/03/2021 10:24:11 -------------------------------------------------------------------------------- Progress Note Details Patient Name: Date of Service: [name] [name], [name] [name] 09/03/2021 8:00 A M Medical Record Number: [id number] Patient Account Number: [id number] Date of Birth/Sex: Treating RN: [birth year] (66 y.o.  [name] Primary Care Provider: [name] Other Clinician: Referring Provider: Treating Provider/Extender: Janne Napoleon in Treatment: 0 Subjective Chief Complaint Information obtained from Patient Patient with history of pelvic mass positive for endometrioid carcinoma status postresection and radiation therapy that presents today for discussion of [name] therapy due to continued symptoms of vaginal and rectal bleeding. History of Present Illness (HPI) 09/03/2021 Patient has a history of ovarian cancer in 2006 treated with chemotherapy carbo/T axol. In 2016 she had an MRI that showed a pelvic mass without invasion into the bladder but abutting to the rectum. 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 She had a radical upper vaginectomy and low anterior resection with colostomy. From 01/29/2015 to 03/10/2015 she received adjunct of radiation. She subsequently developed complications of her ureters and bladder and she ultimately had  the bladder removed. 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 She reports that since her radiation she has also developed vaginal and rectal bleeding It is chronic but improves with the use of doxycycline. She also has a history of diagnosed invasive ductal carcinoma of the left breast in 2017 and underwent bilateral mastectomy. Patient History Information obtained from Patient, Chart. Allergies penicillin (Reaction: hives, swelling), cefaclor (Reaction: rash), erythromycin base (Reaction: gastritis, abd cramps), adhesive tape (Reaction: blisters), Ultram (Reaction: hives), trimethoprim (Reaction: rash), Zarxio (Reaction: intolerance), Cephalosporins (Reaction: rash), fluconazole (Reaction: rash), neomycin (Reaction: blisters), oxycodone (Reaction: intolerance), pectin (Reaction: rash), Septra (Reaction: rash), Sulfa (Sulfonamide Antibiotics) (Reaction: rash) Family History Cancer - Mother,Father,Paternal Grandparents, Diabetes - Father,Siblings, Heart Disease - Father, Hypertension - Mother,Father,Siblings, No family history of Hereditary Spherocytosis, Kidney Disease, Lung Disease, Seizures, Stroke, Thyroid Problems, Tuberculosis. Social History Marital Status - Married, Alcohol Use - Never, Drug Use - No History, Caffeine Use - Never. Medical History Eyes Denies history of Cataracts, Glaucoma, Optic Neuritis Ear/Nose/Mouth/Throat Denies history of Chronic sinus problems/congestion, Middle ear problems Hematologic/Lymphatic Patient has history of Anemia Denies history of Hemophilia, Human Immunodeficiency Virus, Lymphedema, Sickle Cell Disease Respiratory Denies history of Aspiration, Asthma, Chronic Obstructive Pulmonary Disease (COPD), Pneumothorax, Sleep Apnea, Tuberculosis Cardiovascular Patient has history of Arrhythmia - A. Fib, Hypertension Denies history of Angina, Congestive Heart Failure, Coronary Artery Disease, Deep Vein Thrombosis, Hypotension,  Myocardial Infarction, Peripheral Arterial Disease, Peripheral Venous Disease, Phlebitis, Vasculitis Gastrointestinal Denies history of Cirrhosis , Colitis, Crohnoos, Hepatitis A, Hepatitis B, Hepatitis C Endocrine Patient has history of Type II Diabetes Denies history of Type I Diabetes Genitourinary Patient has history of End Stage Renal Disease - stage III Immunological Denies history of Lupus Erythematosus, Raynaudoos, Scleroderma Integumentary (Skin) Denies history of History of Burn Musculoskeletal Denies history of Gout, Rheumatoid Arthritis, Osteoarthritis, Osteomyelitis Neurologic Denies history of Dementia, Neuropathy, Quadriplegia, Paraplegia, Seizure Disorder Oncologic Patient has history of Received Chemotherapy - 2006 ovarian Ca, 2016 Ca colonic mucosa and vagina mucosa,2017 Breast Ca L; bilateral mastectomy, 2017 ductal carcinoma, Received Radiation - 01/2015-03/2015 pelivs Psychiatric Denies history of Anorexia/bulimia, Confinement Anxiety Patient is treated with Controlled Diet, Oral Agents. Blood sugar is tested. Hospitalization/Surgery History - colostomy. - bilateral mastectomy. - vaginectomy. - total hysterectomy. - urostomy. 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 Review of Systems (ROS) Constitutional Symptoms (General Health) Denies complaints or symptoms of Fatigue, Fever, Chills, Marked Weight Change. Eyes Complains or has symptoms of Glasses / Contacts - glasses. Denies complaints or  symptoms of Dry Eyes, Vision Changes. Ear/Nose/Mouth/Throat Denies complaints or symptoms of Chronic sinus problems or rhinitis. Respiratory Denies complaints or symptoms of Chronic or frequent coughs, Shortness of Breath. Cardiovascular Denies complaints or symptoms of Chest pain. Gastrointestinal Denies complaints or symptoms of Frequent diarrhea, Nausea, Vomiting. Endocrine Denies  complaints or symptoms of Heat/cold intolerance. Genitourinary Denies complaints or symptoms of Frequent urination. Integumentary (Skin) Denies complaints or symptoms of Wounds. Musculoskeletal Denies complaints or symptoms of Muscle Pain, Muscle Weakness. Neurologic Denies complaints or symptoms of Numbness/parasthesias. Psychiatric Denies complaints or symptoms of Claustrophobia, Suicidal. Objective Constitutional respirations regular, non-labored and within target range for patient.. Vitals Time Taken: 8:05 AM, Height: 62 in, Source: Stated, Weight: 160 lbs, Source: Stated, BMI: 29.3, Temperature: 98.3 F, Pulse: 80 bpm, Respiratory Rate: 20 breaths/min, Blood Pressure: 107/70 mmHg. Respiratory clear to auscultation bilaterally. Cardiovascular regular rate and rhythm with normal S1, S2. Gastrointestinal (GI) soft, non-tender, non-distended, +BS. Psychiatric pleasant and cooperative. Assessment Active Problems ICD-10 Neutropenia, unspecified Type 2 diabetes mellitus without complications Personal history of malignant neoplasm of ovary Malignant neoplasm of upper-inner quadrant of left female breast Malignant neoplasm of corpus uteri, unspecified Postmenopausal bleeding Patient presents for discussion of [name] therapy. The risks and benefits of [name] were discussed with the patient. Patient has a history of radiation treatment from 01/29/2015 to 03/10/2015 for endometrioid carcinoma involving the colonic mucosa and vaginal mucosa. Cystoscopy on 12/14/2017 showed that the floor of the bladder was abnormal with filmy mucosa and radiation changes. Her bladder has been removed due to radiation damage. 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 At this time due to her history and symptoms of vaginal and rectal bleeding she would benefit from [name] therapy. 52 min was spent on the encounter including face to face, EMR and  coordination of care Plan Follow-up Appointments: Other: - Will call you once we are able to approve or deny hyberbarics. Hyperbaric Oxygen Therapy: Evaluate for [name] Therapy Indication: - Soft tissue injury from radiation If appropriate for treatment, begin HBOT per protocol: 2.0 ATA for 90 Minutes with 2 Five (5) Minute Air Breaks T Number of Treatments: - 40 otal One treatments per day (delivered Monday through Friday unless otherwise specified in Special Instructions below): Finger stick Blood Glucose Pre- and Post- HBOT Treatment. Follow Hyperbaric Oxygen Glycemia Protocol Afrin (Oxymetazoline HCL) 0.05% nasal spray - 1 spray in both nostrils daily as needed prior to [name] treatment for difficulty clearing ears 1. Run IVR for insurance approval of [name] Electronic Signature(s) Signed: 09/15/2021 4:44:26 PM By: [name] DO Previous Signature: 09/03/2021 12:11:13 PM Version By: [name] DO Entered By: [name] on 09/15/2021 16:43:34 -------------------------------------------------------------------------------- HxROS Details Patient Name: Date of Service: [name] [name], [name] [name] 09/03/2021 8:00 A M Medical Record Number: [id number] Patient Account Number: [id number] Date of Birth/Sex: Treating RN: [birth year] (66 y.o. 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 Eyes Complaints and Symptoms: Positive for: Glasses / Contacts - glasses Negative for: Dry Eyes; Vision Changes Medical History: Negative for: Cataracts; Glaucoma; Optic Neuritis Ear/Nose/Mouth/Throat Complaints and  Symptoms: Negative for: Chronic sinus problems or rhinitis Medical History: Negative for: Chronic sinus problems/congestion; Middle  ear problems Respiratory Complaints and Symptoms: Negative for: Chronic or frequent coughs; Shortness of Breath Medical History: Negative for: Aspiration; Asthma; Chronic Obstructive Pulmonary Disease (COPD); Pneumothorax; Sleep Apnea; Tuberculosis Cardiovascular Complaints and Symptoms: Negative for: Chest pain Medical History: Positive for: Arrhythmia - A. Fib; Hypertension Negative for: Angina; Congestive Heart Failure; Coronary Artery Disease; Deep Vein Thrombosis; Hypotension; Myocardial Infarction; Peripheral Arterial Disease; Peripheral Venous Disease; Phlebitis; Vasculitis Gastrointestinal Complaints and Symptoms: Negative for: Frequent diarrhea; Nausea; Vomiting Medical History: Negative for: Cirrhosis ; Colitis; Crohns; Hepatitis A; Hepatitis B; Hepatitis C Endocrine Complaints and Symptoms: Negative for: Heat/cold intolerance Medical History: Positive for: Type II Diabetes Negative for: Type I Diabetes Time with diabetes: 2005 Treated with: Oral agents, Diet Blood sugar tested every day: Yes Tested : daily Genitourinary Complaints and Symptoms: Negative for: Frequent urination Medical History: Positive for: End Stage Renal Disease - stage III Integumentary (Skin) Complaints and Symptoms: Negative for: Wounds Medical History: Negative for: History of Burn Musculoskeletal Complaints and Symptoms: Negative for: Muscle Pain; Muscle Weakness Medical History: Negative for: Gout; Rheumatoid Arthritis; Osteoarthritis; Osteomyelitis Neurologic Complaints and Symptoms: Negative for: Numbness/parasthesias Medical History: Negative for: Dementia; Neuropathy; Quadriplegia; Paraplegia; Seizure Disorder Psychiatric Complaints and Symptoms: Negative for: Claustrophobia; Suicidal Medical History: Negative for: Anorexia/bulimia;  Confinement Anxiety Hematologic/Lymphatic Medical History: Positive for: Anemia Negative for: Hemophilia; Human Immunodeficiency Virus; Lymphedema; Sickle Cell Disease Immunological Medical History: Negative for: Lupus Erythematosus; Raynauds; Scleroderma Oncologic Medical History: Positive for: Received Chemotherapy - 2006 ovarian Ca, 2016 Ca colonic mucosa and vagina mucosa,2017 Breast Ca L; bilateral mastectomy, 2017 ductal carcinoma; Received Radiation - 01/2015-03/2015 pelivs Immunizations Pneumococcal Vaccine: Received Pneumococcal Vaccination: No Implantable Devices No devices added Hospitalization / Surgery History Type of Hospitalization/Surgery colostomy bilateral mastectomy vaginectomy total hysterectomy urostomy Family and Social History Cancer: Yes - Mother,Father,Paternal Grandparents; Diabetes: Yes - Father,Siblings; Heart Disease: Yes - Father; Hereditary Spherocytosis: No; Hypertension: Yes - Mother,Father,Siblings; Kidney Disease: No; Lung Disease: No; Seizures: No; Stroke: No; Thyroid Problems: No; Tuberculosis: No; Marital Status - Married; Alcohol Use: Never; Drug Use: No History; Caffeine Use: Never; Financial Concerns: No; Food, Clothing or Shelter Needs: No; Support System Lacking: No; Transportation Concerns: No Electronic Signature(s) Signed: 09/03/2021 12:11:13 PM By: [name] DO Signed: 09/03/2021 4:09:06 PM By: Deon Pilling RN, BSN Entered By: Deon Pilling on 09/03/2021 08:20:13 -------------------------------------------------------------------------------- SuperBill Details Patient Name: Date of Service: [name] [name], [name] [name] 09/03/2021 Medical Record Number: [id number] Patient Account Number: [id number] Date of Birth/Sex: Treating RN: October 28, 1955 (66 y.o. [name] Primary Care Provider: [name] Other Clinician: Referring Provider: Treating Provider/Extender: Ventura Bruns THERINE [name]:  0 Diagnosis Coding ICD-10 Codes Code Description D70.9 Neutropenia, unspecified E11.9 Type 2 diabetes mellitus without complications S50.53 Personal history of malignant neoplasm of ovary C50.212 Malignant neoplasm of upper-inner quadrant of left female breast 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, unspecified N95.0 Postmenopausal bleeding Facility Procedures CPT4 Code: [id number] Description: [phone] - WOUND CARE VISIT-LEV 2 EST PT Modifier: Quantity: 1 Physician Procedures : CPT4 Code Description Modifier [id number] - WC PHYS LEVEL 4 - NEW PT ICD-10 Diagnosis Description 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, unspecified N95.0 Postmenopausal bleeding E11.9 Type 2  diabetes mellitus without complications Quantity: 1 Electronic Signature(s) Signed: 09/15/2021 4:44:26 PM By: [name] DO Previous Signature: 09/03/2021 2:28:47 PM Version By: [name] DO Previous Signature: 09/03/2021 4:09:06 PM Version By: Deon Pilling RN, BSN Previous Signature: 09/03/2021 12:11:13 PM Version By: [name] DO  Entered By: [name] on 09/15/2021 16:43:57

## 2021-09-08 ENCOUNTER — Other Ambulatory Visit: Payer: Self-pay

## 2021-09-08 ENCOUNTER — Encounter (INDEPENDENT_AMBULATORY_CARE_PROVIDER_SITE_OTHER): Payer: Self-pay | Admitting: Family Medicine

## 2021-09-08 ENCOUNTER — Ambulatory Visit (INDEPENDENT_AMBULATORY_CARE_PROVIDER_SITE_OTHER): Payer: BC Managed Care – PPO | Admitting: Family Medicine

## 2021-09-08 VITALS — BP 124/66 | HR 63 | Temp 97.8°F | Ht 62.0 in | Wt 218.0 lb

## 2021-09-08 DIAGNOSIS — F3289 Other specified depressive episodes: Secondary | ICD-10-CM

## 2021-09-08 DIAGNOSIS — Z9189 Other specified personal risk factors, not elsewhere classified: Secondary | ICD-10-CM

## 2021-09-08 DIAGNOSIS — L89319 Pressure ulcer of right buttock, unspecified stage: Secondary | ICD-10-CM | POA: Diagnosis not present

## 2021-09-08 DIAGNOSIS — Z933 Colostomy status: Secondary | ICD-10-CM | POA: Diagnosis not present

## 2021-09-08 DIAGNOSIS — E669 Obesity, unspecified: Secondary | ICD-10-CM | POA: Diagnosis not present

## 2021-09-08 DIAGNOSIS — E1122 Type 2 diabetes mellitus with diabetic chronic kidney disease: Secondary | ICD-10-CM

## 2021-09-08 DIAGNOSIS — Z7985 Long-term (current) use of injectable non-insulin antidiabetic drugs: Secondary | ICD-10-CM

## 2021-09-08 DIAGNOSIS — N184 Chronic kidney disease, stage 4 (severe): Secondary | ICD-10-CM

## 2021-09-08 DIAGNOSIS — Z6839 Body mass index (BMI) 39.0-39.9, adult: Secondary | ICD-10-CM

## 2021-09-08 DIAGNOSIS — Z6838 Body mass index (BMI) 38.0-38.9, adult: Secondary | ICD-10-CM

## 2021-09-08 MED ORDER — TIRZEPATIDE 7.5 MG/0.5ML ~~LOC~~ SOAJ: 7.5000 mg | SUBCUTANEOUS | 0 refills | Status: DC

## 2021-09-08 MED ORDER — BUPROPION HCL ER (SR) 150 MG PO TB12: 150.0000 mg | ORAL_TABLET | Freq: Two times a day (BID) | ORAL | 0 refills | Status: DC

## 2021-09-09 NOTE — Progress Notes (Signed)
Chief Complaint:   OBESITY [name] is here to discuss her progress with her obesity treatment plan along with follow-up of her obesity related diagnoses. [name] is on keeping a food journal and adhering to recommended goals of 1200-1300 calories and 75+ grams of protein daily and states she is following her eating plan approximately 50% of the time. [name] states she is doing 0 minutes 0 times per week.  Today's visit was #: 66 Starting weight: 208 lbs Starting date: 04/05/2018 Today's weight: 218 lbs Today's date: 09/08/2021 Total lbs lost to date: 0 Total lbs lost since last in-office visit: 0  Interim History: [name] is frustrated with her weight gain and she would like to discuss options of meal plans today. She is struggling with her 67 year old cat not doing well. She notes some emotional eating. She recently had a groin mass removal with benign findings.  Subjective:   1. Type 2 diabetes mellitus with stage 4 chronic kidney disease, without long-term current use of insulin (HCC) [name] is currently taking Ozempic 1 mg, and she denies side effects. Last A1c was 6.0 on 07/23/2021. Her last BMP was 08/19/2021 and last triglycerides was 89 on 07/23/2021.  2. Other depression, with emotional eating [name] taking Wellbutrin SR 150 mg BID, and she denies side effects. She is struggling with emotional eating and stress eating due to her cat not doing well.  3. At risk for impaired metabolic function [name] is at increased risk for impaired metabolic function if calories or protein is too low.  Assessment/Plan:   1. Type 2 diabetes mellitus with stage 4 chronic kidney disease, without long-term current use of insulin ([place]) Joye agreed to discontinue Ozempic, and start Mounjaro 7.5 mg with no refills. Side effects were discussed. Good blood sugar control is important to decrease the likelihood of diabetic complications such as nephropathy, neuropathy, limb loss, blindness, coronary artery  disease, and death. Intensive lifestyle modification including diet, exercise and weight loss are the first line of treatment for diabetes.   - tirzepatide (MOUNJARO) 7.5 MG/0.5ML Pen; Inject 7.5 mg into the skin once a week.  Dispense: 6 mL; Refill: 0  2. Other depression, with emotional eating [name] will continue Wellbutrin SR 150 mg BID with a 90 day refill. Behavior modification techniques were discussed today to help [name] deal with her emotional/non-hunger eating behaviors. Orders and follow up as documented in patient record.   - buPROPion (WELLBUTRIN SR) 150 MG 12 hr tablet; Take 1 tablet (150 mg total) by mouth 2 (two) times daily.  Dispense: 180 tablet; Refill: 0  3. At risk for impaired metabolic function [name] was given approximately 15 minutes of impaired  metabolic function prevention counseling today. We discussed intensive lifestyle modifications today with an emphasis on specific nutrition and exercise instructions and strategies.   Repetitive spaced learning was employed today to elicit superior memory formation and behavioral change.  4. Obesity with current BMI of 39.9 [name] is currently in the action stage of change. As such, her goal is to continue with weight loss efforts. She has agreed to keeping a food journal and adhering to recommended goals of 1200-1300 calories and 70+ grams of protein daily.   Behavioral modification strategies: increasing lean protein intake, increasing water intake, and no skipping meals.  [name] has agreed to follow-up with our clinic in 2 to 3 weeks. She was informed of the importance of frequent follow-up visits to maximize her success with intensive lifestyle modifications for her multiple  health conditions.   Objective:   Blood pressure 124/66, pulse 63, temperature 97.8 F (36.6 C), temperature source Oral, height 5\' 2"  (1.575 m), weight 218 lb (98.9 kg), SpO2 100 %. Body mass index is 39.87 kg/m.  General: Cooperative, alert, well  developed, in no acute distress. HEENT: Conjunctivae and lids unremarkable. Cardiovascular: Regular rhythm.  Lungs: Normal work of breathing. Neurologic: No focal deficits.   Lab Results  Component Value Date   CREATININE 1.60 (H) 08/19/2021   BUN 30 (H) 08/19/2021   NA 140 08/19/2021   K 3.9 08/19/2021   CL 106 08/19/2021   CO2 25 07/23/2021   Lab Results  Component Value Date   ALT 20 07/23/2021   AST 17 07/23/2021   ALKPHOS 112 07/23/2021   BILITOT 0.4 07/23/2021   Lab Results  Component Value Date   HGBA1C 6.0 07/23/2021   HGBA1C 6.3 (H) 06/16/2020   HGBA1C 7.9 (H) 09/01/2017   HGBA1C 5.3 04/01/2017   HGBA1C 5.8 (H) 09/05/2015   Lab Results  Component Value Date   INSULIN 14.1 06/16/2020   Lab Results  Component Value Date   TSH 1.83 07/23/2021   Lab Results  Component Value Date   CHOL 73 07/23/2021   HDL 43.50 07/23/2021   LDLCALC 12 07/23/2021   TRIG 89.0 07/23/2021   CHOLHDL 2 07/23/2021   Lab Results  Component Value Date   VD25OH 25.9 (L) 06/16/2020   VD25OH 40.8 04/05/2018   Lab Results  Component Value Date   WBC 6.4 07/23/2021   HGB 12.9 08/19/2021   HCT 38.0 08/19/2021   MCV 94.2 07/23/2021   PLT 192.0 07/23/2021   Lab Results  Component Value Date   IRON 7 (L) 08/31/2017   TIBC 164 (L) 08/31/2017   FERRITIN 27 11/29/2014   Attestation Statements:   Reviewed by clinician on day of visit: allergies, medications, problem list, medical history, surgical history, family history, social history, and previous encounter notes.   I, Trixie Dredge, am acting as transcriptionist for Dennard Nip, MD.  I have reviewed the above documentation for accuracy and completeness, and I agree with the above. -  Dennard Nip, MD

## 2021-09-18 DIAGNOSIS — Z4801 Encounter for change or removal of surgical wound dressing: Secondary | ICD-10-CM | POA: Diagnosis not present

## 2021-09-18 DIAGNOSIS — Z933 Colostomy status: Secondary | ICD-10-CM | POA: Diagnosis not present

## 2021-09-18 DIAGNOSIS — Z936 Other artificial openings of urinary tract status: Secondary | ICD-10-CM | POA: Diagnosis not present

## 2021-09-18 DIAGNOSIS — L89319 Pressure ulcer of right buttock, unspecified stage: Secondary | ICD-10-CM | POA: Diagnosis not present

## 2021-09-21 ENCOUNTER — Other Ambulatory Visit (HOSPITAL_COMMUNITY): Payer: Self-pay | Admitting: Internal Medicine

## 2021-09-21 DIAGNOSIS — F419 Anxiety disorder, unspecified: Secondary | ICD-10-CM

## 2021-09-21 DIAGNOSIS — Z9289 Personal history of other medical treatment: Secondary | ICD-10-CM

## 2021-09-23 ENCOUNTER — Other Ambulatory Visit: Payer: Self-pay

## 2021-09-23 ENCOUNTER — Other Ambulatory Visit (HOSPITAL_COMMUNITY): Payer: BC Managed Care – PPO

## 2021-09-23 ENCOUNTER — Ambulatory Visit (HOSPITAL_COMMUNITY)
Admission: RE | Admit: 2021-09-23 | Discharge: 2021-09-23 | Disposition: A | Discharge status: Home / Self Care | Payer: BC Managed Care – PPO | Source: Ambulatory Visit | Attending: Internal Medicine | Admitting: Internal Medicine

## 2021-09-23 DIAGNOSIS — Z9289 Personal history of other medical treatment: Secondary | ICD-10-CM | POA: Diagnosis not present

## 2021-09-23 DIAGNOSIS — I440 Atrioventricular block, first degree: Secondary | ICD-10-CM | POA: Insufficient documentation

## 2021-09-23 DIAGNOSIS — I491 Atrial premature depolarization: Secondary | ICD-10-CM | POA: Insufficient documentation

## 2021-09-24 ENCOUNTER — Ambulatory Visit (HOSPITAL_COMMUNITY)
Admission: RE | Admit: 2021-09-24 | Discharge: 2021-09-24 | Disposition: A | Discharge status: Home / Self Care | Payer: BC Managed Care – PPO | Source: Ambulatory Visit | Attending: Internal Medicine | Admitting: Internal Medicine

## 2021-09-24 DIAGNOSIS — Z9882 Breast implant status: Secondary | ICD-10-CM | POA: Diagnosis not present

## 2021-09-24 DIAGNOSIS — F419 Anxiety disorder, unspecified: Secondary | ICD-10-CM | POA: Insufficient documentation

## 2021-09-29 ENCOUNTER — Ambulatory Visit (INDEPENDENT_AMBULATORY_CARE_PROVIDER_SITE_OTHER): Payer: BC Managed Care – PPO | Admitting: Family Medicine

## 2021-09-30 ENCOUNTER — Other Ambulatory Visit: Payer: BC Managed Care – PPO

## 2021-09-30 ENCOUNTER — Ambulatory Visit: Payer: BC Managed Care – PPO | Admitting: Hematology and Oncology

## 2021-10-01 ENCOUNTER — Inpatient Hospital Stay: Payer: BC Managed Care – PPO | Attending: Gynecologic Oncology

## 2021-10-01 ENCOUNTER — Encounter: Payer: Self-pay | Admitting: Hematology and Oncology

## 2021-10-01 ENCOUNTER — Inpatient Hospital Stay (HOSPITAL_BASED_OUTPATIENT_CLINIC_OR_DEPARTMENT_OTHER): Payer: BC Managed Care – PPO | Admitting: Hematology and Oncology

## 2021-10-01 ENCOUNTER — Other Ambulatory Visit: Payer: Self-pay

## 2021-10-01 DIAGNOSIS — D709 Neutropenia, unspecified: Secondary | ICD-10-CM

## 2021-10-01 DIAGNOSIS — Z853 Personal history of malignant neoplasm of breast: Secondary | ICD-10-CM | POA: Diagnosis not present

## 2021-10-01 DIAGNOSIS — C50212 Malignant neoplasm of upper-inner quadrant of left female breast: Secondary | ICD-10-CM | POA: Diagnosis not present

## 2021-10-01 DIAGNOSIS — Z8542 Personal history of malignant neoplasm of other parts of uterus: Secondary | ICD-10-CM | POA: Diagnosis not present

## 2021-10-01 DIAGNOSIS — Z8543 Personal history of malignant neoplasm of ovary: Secondary | ICD-10-CM | POA: Diagnosis not present

## 2021-10-01 DIAGNOSIS — D708 Other neutropenia: Secondary | ICD-10-CM | POA: Diagnosis not present

## 2021-10-01 LAB — CBC WITH DIFFERENTIAL/PLATELET
Abs Immature Granulocytes: 0.01 10*3/uL (ref 0.00–0.07)
Basophils Absolute: 0.1 10*3/uL (ref 0.0–0.1)
Basophils Relative: 1 %
Eosinophils Absolute: 0.1 10*3/uL (ref 0.0–0.5)
Eosinophils Relative: 3 %
HCT: 39.2 % (ref 36.0–46.0)
Hemoglobin: 12.6 g/dL (ref 12.0–15.0)
Immature Granulocytes: 0 %
Lymphocytes Relative: 25 %
Lymphs Abs: 1.1 10*3/uL (ref 0.7–4.0)
MCH: 30.6 pg (ref 26.0–34.0)
MCHC: 32.1 g/dL (ref 30.0–36.0)
MCV: 95.1 fL (ref 80.0–100.0)
Monocytes Absolute: 0.4 10*3/uL (ref 0.1–1.0)
Monocytes Relative: 10 %
Neutro Abs: 2.7 10*3/uL (ref 1.7–7.7)
Neutrophils Relative %: 61 %
Platelets: 210 10*3/uL (ref 150–400)
RBC: 4.12 MIL/uL (ref 3.87–5.11)
RDW: 13.6 % (ref 11.5–15.5)
WBC: 4.3 10*3/uL (ref 4.0–10.5)
nRBC: 0 % (ref 0.0–0.2)

## 2021-10-01 NOTE — Assessment & Plan Note (Signed)
She has no further signs of cancer recurrence ?She follows closely with GYN oncologist ?Her recent biopsy from the vulvar area came back nonmalignant ?

## 2021-10-01 NOTE — Assessment & Plan Note (Signed)
She has no clinical findings suggestive of recurrent cancer ?She does not need long-term imaging study ?

## 2021-10-01 NOTE — Progress Notes (Signed)
[facility] OFFICE PROGRESS NOTE  Patient Care Team: Carollee Herter, Alferd Apa, DO as PCP - General (Family Medicine) Belva Crome, MD as PCP - Cardiology (Cardiology) Heath Lark, MD as Consulting Physician (Hematology and Oncology) Everitt Amber, MD as Consulting Physician (Obstetrics and Gynecology) Michael [name], MD as Consulting Physician (General Surgery) Alexis Frock, MD as Consulting Physician (Urology) Altheimer, Legrand Como, MD as Consulting Physician (Endocrinology) Katy Apo, MD as Consulting Physician (Ophthalmology) Irene Limbo, MD as Consulting Physician (Plastic Surgery) Madelon Lips, MD as Consulting Physician (Nephrology) Starlyn Skeans, MD as Consulting Physician (Family Medicine) Rocky Morel, MD as Referring Physician (Plastic Surgery) Belva Crome, MD as Consulting Physician (Cardiology) Ponciano Ort, MD as Referring Physician (Urology) Belva Crome, MD as Consulting Physician (Cardiology)  ASSESSMENT & PLAN:  Breast cancer of upper-inner quadrant of left female breast [facility]) She has no clinical findings suggestive of recurrent cancer She does not need long-term imaging study  Chronic neutropenia [facility]) She is doing well with Granix injection every 6 days We will continue indefinitely Chronic treatment with Granix has reduce the risk of hospitalization and sepsis  History of ovarian & endometrial cancer She has no further signs of cancer recurrence She follows closely with GYN oncologist Her recent biopsy from the vulvar area came back nonmalignant  No orders of the defined types were placed in this encounter.   All questions were answered. The patient knows to call the clinic with any problems, questions or concerns. The total time spent in the appointment was 20 minutes encounter with patients including review of chart and various tests results, discussions about plan of care and coordination of care plan   Heath Lark, MD 10/01/2021 1:28 PM  INTERVAL HISTORY: Please see below for problem oriented charting. 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 She was prescribed antibiotics not long ago that seems to help resolve some rash around her stoma  REVIEW OF SYSTEMS:   Constitutional: Denies fevers, chills or abnormal weight loss Eyes: Denies blurriness of vision Ears, nose, mouth, throat, and face: Denies mucositis or sore throat Respiratory: Denies cough, dyspnea or wheezes Cardiovascular: Denies palpitation, chest discomfort or lower extremity swelling Gastrointestinal:  Denies nausea, heartburn or change in bowel habits Skin: Denies abnormal skin rashes Lymphatics: Denies new lymphadenopathy or easy bruising Neurological:Denies numbness, tingling or new weaknesses Behavioral/Psych: Mood is stable, no new changes  All other systems were reviewed with the patient and are negative.  I have reviewed the past medical history, past surgical history, social history and family history with the patient and they are unchanged from previous note.  ALLERGIES:  is allergic to penicillins, cefaclor, erythromycin, tape, trimethoprim, ultram [tramadol], zarxio [filgrastim], cephalosporins, fluconazole, neomycin, oxycodone, pectin, septra [sulfamethoxazole-trimethoprim], and sulfa antibiotics.  MEDICATIONS:  Current Outpatient Medications  Medication Sig Dispense Refill   acetaminophen (TYLENOL) 500 MG tablet Take 500 mg by mouth every 6 (six) hours as needed.     albuterol (PROVENTIL HFA) 108 (90 Base) MCG/ACT inhaler Inhale 1 puff into the lungs every 6 (six) hours as needed for wheezing or shortness of breath. (Patient taking differently: Inhale 1 puff  into the lungs every 6 (six) hours as needed for wheezing or shortness of breath.) 18 g 2   apixaban (ELIQUIS) 5  MG TABS tablet Take 1 tablet (5 mg total) by mouth 2 (two) times daily. (Patient taking differently: Take 5 mg by mouth daily.) 180 tablet 1   Biotin 5 MG TABS Take 5 mg by mouth daily.      buPROPion (WELLBUTRIN SR) 150 MG 12 hr tablet Take 1 tablet (150 mg total) by mouth 2 (two) times daily. 180 tablet 0   calcium citrate-vitamin D (CITRACAL+D) 315-200 MG-UNIT tablet Take 1 tablet by mouth daily.     Cholecalciferol (VITAMIN D3) 10000 UNITS capsule Take 10,000 Units by mouth See admin instructions. Take 1 tablet by mouth 43times per week.     clobetasol (OLUX) 0.05 % topical foam Apply topically 2 (two) times daily. (Patient taking differently: Apply topically 2 (two) times daily as needed.) 50 g 3   diphenhydrAMINE (BENADRYL) 25 mg capsule Take 1 capsule (25 mg total) by mouth every 8 (eight) hours as needed for itching, allergies or sleep. 30 capsule 0   famotidine-calcium carbonate-magnesium hydroxide (PEPCID COMPLETE) 10-800-165 MG chewable tablet Chew 1 tablet by mouth as needed.     ferrous sulfate 325 (65 FE) MG tablet Take 1 tablet (325 mg total) by mouth at bedtime. (Patient taking differently: Take 325 mg by mouth at bedtime.) 30 tablet 3   fluticasone (FLONASE) 50 MCG/ACT nasal spray SHAKE LIQUID AND USE 2 SPRAYS IN EACH NOSTRIL DAILY 48 g 0   HYDROcodone bit-homatropine (HYCODAN) 5-1.5 MG/5ML syrup Take 5 mLs by mouth every 6 (six) hours as needed for cough. 120 mL 0   levothyroxine (SYNTHROID, LEVOTHROID) 150 MCG tablet Take 1 tablet (150 mcg total) by mouth daily before breakfast. (Patient taking differently: Take 150 mcg by mouth as directed. ONE DAILY WITH EXCEPTION ON SUDAY'S TAKE 2 TABS) 30 tablet 1   loratadine (CLARITIN) 10 MG tablet Take 10 mg by mouth daily.      Melatonin 10 MG TABS Take 1 tablet by mouth at bedtime.     omega-3 acid ethyl esters (LOVAZA) 1 G  capsule Take 1 g by mouth 2 (two) times daily.      Polyethyl Glycol-Propyl Glycol (SYSTANE OP) Place 1 drop into both eyes daily as needed (dry eyes).      pregabalin (LYRICA) 50 MG capsule Take 1 capsule (50 mg total) by mouth 2 (two) times daily. (Patient taking differently: Take 50 mg by mouth 2 (two) times daily.) 60 capsule 5   Prenatal Vit-Fe Fumarate-FA (PRENATAL VITAMIN PO) Take 1 capsule by mouth daily. Takes prenatal because there are no dyes in it     Probiotic Product (ALIGN PO) Take 1 capsule by mouth daily.     rifaximin (XIFAXAN) 550 MG TABS tablet Take 550 mg by mouth See admin instructions. Take one tablet by mouth three times daily for 14 days when needed.     rosuvastatin (CRESTOR) 10 MG tablet Take 10 mg by mouth every evening.     Saccharomyces boulardii (FLORASTOR PO) Take 1 capsule by mouth at bedtime.     Tazarotene (ARAZLO) 0.045 % LOTN Apply 1 application topically daily. 45 g 0   Tbo-Filgrastim (GRANIX) 480 MCG/0.8ML SOSY injection 480 mcg subcutaneous injection every 6 days life long 11.2 mL 11   tirzepatide (MOUNJARO) 7.5 MG/0.5ML Pen Inject 7.5 mg into the skin once a week. 6 mL 0   tiZANidine (ZANAFLEX) 4 MG tablet Take 0.5-1 tablets (2-4 mg total) by mouth every 8 (eight) hours as needed for muscle spasms. 60 tablet 1   No current  facility-administered medications for this visit.    SUMMARY OF ONCOLOGIC HISTORY: Oncology History Overview Note  Breast cancer of upper-inner quadrant of left female breast ([place])   Staging form: Breast, AJCC 7th Edition     Clinical stage from 12/31/2015: Stage IIA (T2, N0, M0) - Signed by Heath Lark, MD on 12/31/2015  ER,PR positive Her2 neg   History of ovarian & endometrial cancer  10/15/2004 Initial Diagnosis   History of ovarian cancer, treated with chemotherapy carbo/Taxol   03/17/2009 Bone Marrow Biopsy   Bone marrow biopsy at [facility] showed neutropenia   01/23/2014 Bone Marrow Biopsy   Repeat bone marrow biopsy  showed neutropenia   09/12/2014 Imaging   CT scan showed possible cancer   10/14/2014 Imaging   MRI show significant pelvic mass without invasion into the bladder but abutting to the rectum   11/19/2014 Surgery   she underwent surgery and had robotic-assisted lysis of adhesions, converted to laparotomy, radical upper vaginectomy and low anterior resection with colostomy. 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 Resection margins were negative   01/29/2015 - 03/10/2015 Radiation Therapy   She received adjuvant radiation   03/03/2015 Imaging   CT scan of the abdomen and pelvis showed status post interval removal of the bilateral nephroureteral stents. Worsening moderate right hydroureteronephrosis. Resolved left hydroureteronephrosis.   03/20/2015 Procedure   she had cystoscopy and stent placement for right hydronephrosis   05/02/2015 Surgery   Cystoscopy with right retrograde pyelogram interpretation. Diagnostic ureteroscopy. Removal of right ureteral stent.   05/14/2015 Imaging   CT scan of the abdomen and pelvis show unilateral right hydronephrosis with no residual cancer   08/18/2015 Imaging   CT scan showed hysterectomy with stable presacral soft tissue thickening. No definitive evidence of recurrent or metastatic disease. Severe right hydronephrosis   09/12/2015 Surgery   Cystoscopy with right retrograde pyelogram interpretation. Right ureteral stent placement 5 x 24 Polaris, no tether   10/07/2016 Imaging   Stable exam.  No new or progressive findings. 2. Stable appearance abnormal presacral soft tissue compatible with post treatment change. 3. Internal right ureteral stent with persistent right hydroureteronephrosis. Differentially decreased perfusion of the right kidney suggests a component of  underlying obstructive uropathy. 4. Stable 13 mm left adrenal nodule, previously characterized as adenoma.       01/27/2017 Imaging   Stable postop and post radiation changes in presacral region. No evidence of recurrent or metastatic carcinoma within the chest, abdomen, or pelvis. Stable mild to moderate right hydroureteronephrosis, with right ureteral stent in appropriate position. Stable small benign left adrenal adenoma and left thyroid lobe nodule.   10/02/2019 Tumor Marker   Patient's tumor was tested for the following markers: CA-125 Results of the tumor marker test revealed 5.8   Breast cancer of upper-inner quadrant of left female breast ([place])  10/14/2015 Imaging   Screening mammogram showed possible distortion in the left breast.   10/24/2015 Imaging   Targeted ultrasound is performed, showing a 0.6 x 0.8 x 0.9 cm area of hypoechoic distortion at the 10 o'clock position of the left breast 5 cm from the nipple   10/24/2015 Imaging   Diagnostic imaging confirmed 0.6 x 0.8 x 0.9 cm distortion in the upper inner left breast   10/30/2015 Procedure   Left US guided biopsy was performed   10/30/2015 Pathology Results   Accession:  [id number] showed invasive ductal carcinoma, ER/PR positive, Her2 neg   11/05/2015 Genetic Testing   Genetic testing did not reveal a deleterious mutation.  Genetic testing did detect a Variant of Unknown Significance in the MSH6 gene called c.389A>G..  Genes tested include: APC, ATM, AXIN2, BARD1, BMPR1A, BRCA1, BRCA2, BRIP1, CDH1, CDKN2A, CHEK2, DICER1, EPCAM, GREM1, KIT, MEN1, MLH1, MSH2, MSH6, MUTYH, NBN, NF1, PALB2, PDGFRA, PMS2, POLD1, POLE, PTEN, RAD50, RAD51C, RAD51D, SDHA, SDHB, SDHC, SDHD, SMAD4, SMARCA4. STK11, TP53, TSC1, TSC2, and VHL.  UPDATE: MSH6 c.389A>G (p.His130Arg) has been reclassified as Likely Benign.  The amended report date is February 25, 2021.   12/11/2015 Surgery   She undwerwent bilateral mastectomy, left SLN biopsy and immediate  reconstruction surgeries with bilateral plasma expanders   12/11/2015 Pathology Results   Accession: [id number] mastectomy specimens showed left breast ca, [id number]   12/26/2015 Pathology Results   Accession: [id number] specimen from debridement showed no cancer   12/26/2015 Surgery   she underwent surgical debridement of necrotic tissue at site of plasma expander   01/12/2016 Imaging   Bone density is normal   02/02/2016 -  Anti-estrogen oral therapy   She started taking Arimidex   10/21/2017 Procedure   Ultrasound guided biopsy of a vague hypoechoic shadowing lesion, generally corresponding to area palpable abnormality, along chest lateral to the reconstructed left breast and inferior to the left axilla. No apparent complications.   01/17/2018 Imaging   Bone density scan is normal     PHYSICAL EXAMINATION: ECOG PERFORMANCE STATUS: 1 - Symptomatic but completely ambulatory  Vitals:   10/01/21 1240  BP: (!) 139/55  Pulse: 63  Resp: 18  Temp: 97.8 F (36.6 C)  SpO2: 96%   Filed Weights   10/01/21 1240  Weight: 219 lb 3.2 oz (99.4 kg)    GENERAL:alert, no distress and comfortable OROPHARYNX: She had mild changes of mucositis on the lower lip, nonmalignant and does not appear to be infectious NEURO: alert & oriented x 3 with fluent speech, no focal motor/sensory deficits  LABORATORY DATA:  I have reviewed the data as listed    Component Value Date/Time   NA 140 08/19/2021 1002   NA 140 07/20/2021 1428   NA 141 01/24/2017 1228   K 3.9 08/19/2021 1002   K 4.6 01/24/2017 1228   CL 106 08/19/2021 1002   CO2 25 07/23/2021 1333   CO2 29 01/24/2017 1228   GLUCOSE 111 (H) 08/19/2021 1002   GLUCOSE 84 01/24/2017 1228   BUN 30 (H) 08/19/2021 1002   BUN 30 (H) 07/20/2021 1428   BUN 22.2 01/24/2017 1228   CREATININE 1.60 (H) 08/19/2021 1002   CREATININE 1.91 (H) 01/18/2020 1440   CREATININE 0.8 01/24/2017 1228   CALCIUM 9.0 07/23/2021 1333   CALCIUM 10.2 01/24/2017 1228    PROT 7.0 07/23/2021 1333   PROT 7.8 06/16/2020 0834   PROT 6.9 01/24/2017 1228   ALBUMIN 3.8 07/23/2021 1333   ALBUMIN 4.4 06/16/2020 0834   ALBUMIN 3.4 (L) 01/24/2017 1228   AST 17 07/23/2021 1333   AST 14 (L) 03/27/2018 1324   AST 16 01/24/2017 1228   ALT 20 07/23/2021 1333   ALT 18 03/27/2018 1324   ALT 14 01/24/2017 1228   ALKPHOS 112 07/23/2021 1333   ALKPHOS 90 01/24/2017 1228   BILITOT 0.4 07/23/2021 1333   BILITOT 0.3 06/16/2020 0834   BILITOT 0.2 (L) 03/27/2018 1324   BILITOT 0.34 01/24/2017 1228   GFRNONAA 33 (L) 06/16/2020 0834   GFRNONAA 26 (  L) 03/27/2018 1324   GFRNONAA 78 12/16/2014 1530   GFRAA 38 (L) 06/16/2020 0834   GFRAA 30 (L) 03/27/2018 1324   GFRAA >89 12/16/2014 1530    No results found for: SPEP, UPEP  Lab Results  Component Value Date   WBC 4.3 10/01/2021   NEUTROABS 2.7 10/01/2021   HGB 12.6 10/01/2021   HCT 39.2 10/01/2021   MCV 95.1 10/01/2021   PLT 210 10/01/2021      Chemistry      Component Value Date/Time   NA 140 08/19/2021 1002   NA 140 07/20/2021 1428   NA 141 01/24/2017 1228   K 3.9 08/19/2021 1002   K 4.6 01/24/2017 1228   CL 106 08/19/2021 1002   CO2 25 07/23/2021 1333   CO2 29 01/24/2017 1228   BUN 30 (H) 08/19/2021 1002   BUN 30 (H) 07/20/2021 1428   BUN 22.2 01/24/2017 1228   CREATININE 1.60 (H) 08/19/2021 1002   CREATININE 1.91 (H) 01/18/2020 1440   CREATININE 0.8 01/24/2017 1228   GLU 131 11/29/2018 0000      Component Value Date/Time   CALCIUM 9.0 07/23/2021 1333   CALCIUM 10.2 01/24/2017 1228   ALKPHOS 112 07/23/2021 1333   ALKPHOS 90 01/24/2017 1228   AST 17 07/23/2021 1333   AST 14 (L) 03/27/2018 1324   AST 16 01/24/2017 1228   ALT 20 07/23/2021 1333   ALT 18 03/27/2018 1324   ALT 14 01/24/2017 1228   BILITOT 0.4 07/23/2021 1333   BILITOT 0.3 06/16/2020 0834   BILITOT 0.2 (L) 03/27/2018 1324   BILITOT 0.34 01/24/2017 1228

## 2021-10-01 NOTE — Assessment & Plan Note (Signed)
She is doing well with Granix injection every 6 days ?We will continue indefinitely ?Chronic treatment with Granix has reduce the risk of hospitalization and sepsis ?

## 2021-10-05 ENCOUNTER — Encounter (HOSPITAL_BASED_OUTPATIENT_CLINIC_OR_DEPARTMENT_OTHER): Payer: BC Managed Care – PPO | Admitting: Internal Medicine

## 2021-10-05 ENCOUNTER — Encounter (HOSPITAL_BASED_OUTPATIENT_CLINIC_OR_DEPARTMENT_OTHER): Payer: BC Managed Care – PPO | Attending: Internal Medicine | Admitting: Internal Medicine

## 2021-10-05 ENCOUNTER — Other Ambulatory Visit: Payer: Self-pay

## 2021-10-05 DIAGNOSIS — Y842 Radiological procedure and radiotherapy as the cause of abnormal reaction of the patient, or of later complication, without mention of misadventure at the time of the procedure: Secondary | ICD-10-CM | POA: Insufficient documentation

## 2021-10-05 DIAGNOSIS — Z853 Personal history of malignant neoplasm of breast: Secondary | ICD-10-CM | POA: Insufficient documentation

## 2021-10-05 DIAGNOSIS — N3041 Irradiation cystitis with hematuria: Secondary | ICD-10-CM | POA: Insufficient documentation

## 2021-10-05 DIAGNOSIS — C549 Malignant neoplasm of corpus uteri, unspecified: Secondary | ICD-10-CM | POA: Insufficient documentation

## 2021-10-05 DIAGNOSIS — C50212 Malignant neoplasm of upper-inner quadrant of left female breast: Secondary | ICD-10-CM | POA: Insufficient documentation

## 2021-10-05 DIAGNOSIS — E119 Type 2 diabetes mellitus without complications: Secondary | ICD-10-CM | POA: Insufficient documentation

## 2021-10-05 DIAGNOSIS — L59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: Secondary | ICD-10-CM | POA: Insufficient documentation

## 2021-10-05 DIAGNOSIS — N950 Postmenopausal bleeding: Secondary | ICD-10-CM | POA: Diagnosis not present

## 2021-10-05 DIAGNOSIS — D709 Neutropenia, unspecified: Secondary | ICD-10-CM | POA: Insufficient documentation

## 2021-10-05 DIAGNOSIS — Z8543 Personal history of malignant neoplasm of ovary: Secondary | ICD-10-CM | POA: Diagnosis not present

## 2021-10-05 LAB — GLUCOSE, CAPILLARY
Glucose-Capillary: 149 mg/dL — ABNORMAL HIGH (ref 70–99)
Glucose-Capillary: 104 mg/dL — ABNORMAL HIGH (ref 70–99)

## 2021-10-05 NOTE — Progress Notes (Addendum)
[name], [name] ([id number]) ?Visit Report for 10/05/2021 ?[name] Details ?Patient Name: Date of Service: ?HO [place], [name] 10/05/2021 9:30 A M ?Medical Record Number: [id number] ?Patient Account Number: [id number] ?Date of Birth/Sex: Treating RN: ?05-16-1956 (66 y.o. 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 ?[name] Treatment Details ?Treatment Number: 1 ?Patient Type: Outpatient ?Chamber Type: Monoplace ?Chamber Serial #: [id number] ?Treatment Protocol: 2.0 ATA with 90 minutes oxygen, with two 5 minute air breaks ?Treatment Details ?Compression Rate Down: 1.0 psi / minute ?De-Compression Rate Up: 1.0 psi / minute ?A breaks and breathing ?ir ?Compress Tx Pressure periods Decompress Decompress ?Begins Reached (leave unused spaces Begins Ends ?blank) ?Chamber Pressure (ATA '1 2 2 2 2 2 '$ --2 1 ?) ?Clock Time (24 hr) 10:02 10:26 10:56 11:01 11:31 11:36 - - 12:06 12:22 ?Treatment Length: 140 (minutes) ?Treatment Segments: 5 ?Vital Signs ?Capillary Blood Glucose Reference Range: 80 - 120 mg / dl ?[name] Diabetic Blood Glucose Intervention Range: <131 mg/dl or >249 mg/dl ?Type: Time Vitals Blood Pulse: Respiratory Temperature: Capillary Blood Glucose Pulse Action ?Taken: ?Pressure: ?Rate: ?Glucose (mg/dl): Meter #: Oximetry (%) Taken: ?Pre 09:36 126/81 76 20 97.9 149 2 ?Post 12:25 132/55 63 18 97.6 104 2 discharge per protocol >101 mg/dL ?Treatment Response ?Treatment Toleration: Well ?Treatment Completion Status: Treatment Completed without Adverse Event ?Treatment Notes ?Patient safely placed in chamber after completing safety check. Patient  experienced minor difficulty equalizing ears at idle pressure which was relieved by ?turning chamber off and allowing pressure to return to ambient pressure. Patient stated she was able to equalize pressure in the middle ear during the ?compression phase of the treatment. Patient wasn't sure a few times if her right ear was equalizing like the left ear. This was checked by reversing pressure ?approximately 0.5 psi to allow equalization of pressure. This happened at 7 psi, 12.5 psi and again at 14 psi. Patient stated that pressure was equalized and ?compression of the chamber continued. ?Treatment proceeded without incident. During the decompression phase, patient was able to equalize pressure in middle ears without any problems. ?Physician [name] Attestation: ?I certify that I supervised this [name] treatment in accordance with Medicare ?guidelines. A trained emergency response team is readily available per Yes ?hospital policies and procedures. ?Continue HBOT as ordered. Yes ?Electronic Signature(s) ?Signed: 10/05/2021 4:23:17 PM By: [name] Shan DO ?Previous Signature: 10/05/2021 11:24:08 AM Version By: [name] CHT EMT BS ?, , ?Entered By: [name] on 10/05/2021 16:22:00 ?-------------------------------------------------------------------------------- ?[name] Safety Checklist Details ?Patient Name: ?Date of Service: ?[name], [name] 10/05/2021 9:30 A M ?Medical Record Number: [id number] ?Patient Account Number: [id number] ?Date of Birth/Sex: ?Treating RN: ?November 02, 1955 (66 y.o. [name], [name] ?Primary Care [name]: [name] ?Other Clinician: Donavan [name] ?Referring [name]: ?Treating [name]: [name] ?[name] ?[name]: 4 ?[name] Safety Checklist Items ?Safety Checklist ?Consent Form Signed ?Patient voided / foley secured and emptied ?When did you last eato 0800 ?Last dose of injectable or oral agent Ozempic taken this past Saturday ?Ostomy pouch  emptied and vented if applicable Urostomy connected to collection bag. ?All implantable devices assessed, documented and approved Silicone Breast Implants ?Intravenous access site secured and place ?NA ?Valuables secured ?Linens and cotton and cotton/polyester blend (less than  51% polyester) ?Personal oil-based products / skin lotions / body lotions removed ?Wigs or hairpieces removed ?NA ?Smoking or tobacco materials removed ?NA ?Books / newspapers / magazines / loose paper removed ?Cologne, aftershave, perfume and deodorant removed ?Jewelry removed (may wrap wedding band) ?Make-up removed ?Hair care products removed ?Battery operated devices (external) removed ?Heating patches and chemical warmers removed ?Titanium eyewear removed ?NA Eyewear is plastic ?Nail polish cured greater than 10 hours ?Casting material cured greater than 10 hours ?NA ?Hearing aids removed ?NA ?Loose dentures or partials removed ?NA ?Prosthetics have been removed ?NA ?Patient demonstrates correct use of air break device (if applicable) ?Patient concerns have been addressed ?Patient grounding bracelet on and cord attached to chamber ?Specifics for Inpatients (complete in addition to above) ?Medication sheet sent with patient ?NA ?Intravenous medications needed or due during therapy sent with patient ?NA ?Drainage tubes (e.g. nasogastric tube or chest tube secured and vented) ?NA ?Endotracheal or Tracheotomy tube secured ?NA ?Cuff deflated of air and inflated with saline ?NA ?Airway suctioned ?NA ?Notes ?Paper version used prior to treatment. ?Electronic Signature(s) ?Signed: 10/05/2021 10:51:08 AM By: [name] CHT EMT BS ?, , ?Entered By: [name] on 10/05/2021 10:51:07 ?

## 2021-10-05 NOTE — Progress Notes (Signed)
[name], [name] ([id number]) Visit Report for 10/05/2021 Arrival Information Details Patient Name: Date of Service: [place], [place] 10/05/2021 8:45 A M Medical Record Number: [id number] Patient Account Number: [id number] Date of Birth/Sex: Treating RN: [birth year] (66 y.o. [name] Primary Care Orie Cuttino: [name] Other Clinician: Referring Exodus Kutzer: Treating Farley Crooker/Extender: Tharon Aquas in Treatment: 4 Visit Information History Since Last Visit Added or deleted any medications: No Patient Arrived: Ambulatory Any new allergies or adverse reactions: No Arrival Time: 08:49 Had a fall or experienced change in No Accompanied By: self activities of daily living that may affect Transfer Assistance: None risk of falls: Patient Identification Verified: Yes Signs or symptoms of abuse/neglect since last visito No Secondary Verification Process Completed: Yes Hospitalized since last visit: No Patient Requires Transmission-Based Precautions: No Implantable device outside of the clinic excluding No Patient Has Alerts: Yes cellular tissue based products placed in the center Patient Alerts: Patient on Blood Thinner since last visit: Pain Present Now: No Electronic Signature(s) Signed: 10/05/2021 5:15:48 PM By: Deon Pilling RN, BSN Entered By: Deon Pilling on 10/05/2021 08:50:10 -------------------------------------------------------------------------------- Clinic Level of Care Assessment Details Patient Name: Date of Service: [name] 10/05/2021 8:45 A M Medical Record Number: [id number] Patient Account Number: [id number] Date of Birth/Sex: Treating RN: [birth year] (66 y.o. [name], [name] Primary Care Riven Beebe: [name] Other Clinician: Referring Margueritte Guthridge: Treating Darrio Bade/Extender: Tharon Aquas in Treatment: 4 Clinic Level of Care Assessment Items TOOL 4 Quantity Score X- 1 0 Use when  only an EandM is performed on FOLLOW-UP visit ASSESSMENTS - Nursing Assessment / Reassessment X- 1 10 Reassessment of Co-morbidities (includes updates in patient status) X- 1 5 Reassessment of Adherence to Treatment Plan ASSESSMENTS - Wound and Skin A ssessment / Reassessment '[]'$  - 0 Simple Wound Assessment / Reassessment - one wound '[]'$  - 0 Complex Wound Assessment / Reassessment - multiple wounds '[]'$  - 0 Dermatologic / Skin Assessment (not related to wound area) ASSESSMENTS - Focused Assessment '[]'$  - 0 Circumferential Edema Measurements - multi extremities '[]'$  - 0 Nutritional Assessment / Counseling / Intervention '[]'$  - 0 Lower Extremity Assessment (monofilament, tuning fork, pulses) '[]'$  - 0 Peripheral Arterial Disease Assessment (using hand held doppler) ASSESSMENTS - Ostomy and/or Continence Assessment and Care '[]'$  - 0 Incontinence Assessment and Management '[]'$  - 0 Ostomy Care Assessment and Management (repouching, etc.) PROCESS - Coordination of Care X - Simple Patient / Family Education for ongoing care 1 15 '[]'$  - 0 Complex (extensive) Patient / Family Education for ongoing care X- 1 10 Staff obtains Programmer, systems, Records, T Results / Process Orders est '[]'$  - 0 Staff telephones HHA, Nursing Homes / Clarify orders / etc '[]'$  - 0 Routine Transfer to another Facility (non-emergent condition) '[]'$  - 0 Routine Hospital Admission (non-emergent condition) '[]'$  - 0 New Admissions / Biomedical engineer / Ordering NPWT Apligraf, etc. , '[]'$  - 0 Emergency Hospital Admission (emergent condition) X- 1 10 Simple Discharge Coordination '[]'$  - 0 Complex (extensive) Discharge Coordination PROCESS - Special Needs '[]'$  - 0 Pediatric / Minor Patient Management '[]'$  - 0 Isolation Patient Management '[]'$  - 0 Hearing / Language / Visual special needs '[]'$  - 0 Assessment of Community assistance (transportation, D/C planning, etc.) '[]'$  - 0 Additional assistance / Altered mentation '[]'$  - 0 Support  Surface(s) Assessment (bed, cushion, seat, etc.) INTERVENTIONS - Wound Cleansing / Measurement '[]'$  - 0 Simple Wound Cleansing - one wound '[]'$  - 0 Complex Wound Cleansing - multiple  wounds '[]'$  - 0 Wound Imaging (photographs - any number of wounds) '[]'$  - 0 Wound Tracing (instead of photographs) '[]'$  - 0 Simple Wound Measurement - one wound '[]'$  - 0 Complex Wound Measurement - multiple wounds INTERVENTIONS - Wound Dressings '[]'$  - 0 Small Wound Dressing one or multiple wounds '[]'$  - 0 Medium Wound Dressing one or multiple wounds '[]'$  - 0 Large Wound Dressing one or multiple wounds '[]'$  - 0 Application of Medications - topical '[]'$  - 0 Application of Medications - injection INTERVENTIONS - Miscellaneous '[]'$  - 0 External ear exam '[]'$  - 0 Specimen Collection (cultures, biopsies, blood, body fluids, etc.) '[]'$  - 0 Specimen(s) / Culture(s) sent or taken to Lab for analysis '[]'$  - 0 Patient Transfer (multiple staff / Civil Service fast streamer / Similar devices) '[]'$  - 0 Simple Staple / Suture removal (25 or less) '[]'$  - 0 Complex Staple / Suture removal (26 or more) '[]'$  - 0 Hypo / Hyperglycemic Management (close monitor of Blood Glucose) '[]'$  - 0 Ankle / Brachial Index (ABI) - do not check if billed separately X- 1 5 Vital Signs Has the patient been seen at the hospital within the last three years: Yes Total Score: 55 Level Of Care: New/Established - Level 2 Electronic Signature(s) Signed: 10/05/2021 5:15:48 PM By: Deon Pilling RN, BSN Entered By: Deon Pilling on 10/05/2021 08:59:16 -------------------------------------------------------------------------------- Encounter Discharge Information Details Patient Name: Date of Service: [name], [name] 10/05/2021 8:45 A M Medical Record Number: [id number] Patient Account Number: [id number] Date of Birth/Sex: Treating RN: 1956/01/20 (66 y.o. [name] Primary Care Ladislaus Repsher: [name] Other Clinician: Referring Natayah Warmack: Treating Ashanti Littles/Extender:  Tharon Aquas in Treatment: 4 Encounter Discharge Information Items Discharge Condition: Stable Ambulatory Status: Ambulatory Discharge Destination: Home Transportation: Private Auto Accompanied By: self Schedule Follow-up Appointment: Yes Clinical Summary of Care: Electronic Signature(s) Signed: 10/05/2021 5:15:48 PM By: Deon Pilling RN, BSN Entered By: Deon Pilling on 10/05/2021 08:59:57 -------------------------------------------------------------------------------- Lower Extremity Assessment Details Patient Name: Date of Service: [name] 10/05/2021 8:45 A M Medical Record Number: [id number] Patient Account Number: [id number] Date of Birth/Sex: Treating RN: 15-Nov-1955 (65 y.o. [name] Primary Care Darrall Strey: [name] Other Clinician: Referring Trenisha Lafavor: Treating Treysen Sudbeck/Extender: Tharon Aquas in Treatment: 4 Electronic Signature(s) Signed: 10/05/2021 5:15:48 PM By: Deon Pilling RN, BSN Entered By: Deon Pilling on 10/05/2021 08:51:14 -------------------------------------------------------------------------------- Multi Wound Chart Details Patient Name: Date of Service: HO [name], [name] 10/05/2021 8:45 A M Medical Record Number: [id number] Patient Account Number: [id number] Date of Birth/Sex: Treating RN: [birth year] (66 y.o. F) Primary Care Davien Malone: Other Clinician: [name] Referring Simranjit Thayer: Treating Lucilia Yanni/Extender: Tharon Aquas in Treatment: 4 Vital Signs Height(in): 62 Pulse(bpm): 76 Weight(lbs): 160 Blood Pressure(mmHg): 165/77 Body Mass Index(BMI): 29.3 Temperature(F): 97.9 Respiratory Rate(breaths/min): 20 Wound Assessments Treatment Notes Electronic Signature(s) Signed: 10/05/2021 9:19:16 AM By: [name] DO Entered By: [name] on 10/05/2021  09:11:41 -------------------------------------------------------------------------------- Multi-Disciplinary Care Plan Details Patient Name: Date of Service: HO [name], [name] 10/05/2021 8:45 A M Medical Record Number: [id number] Patient Account Number: [id number] Date of Birth/Sex: Treating RN: [birth year] (66 y.o. [name], Meta.Reding Primary Care Essense Bousquet: [name] Other Clinician: Referring Vernona Peake: Treating Makia Bossi/Extender: Tharon Aquas in Treatment: 4 Active Inactive [name] Nursing Diagnoses: Potential for barotraumas to ears, sinuses, teeth, and lungs or cerebral gas embolism related to changes in atmospheric pressure inside hyperbaric oxygen chamber Potential for oxygen toxicity seizures related to delivery of 100%  oxygen at an increased atmospheric pressure Goals: Patient will tolerate the hyperbaric oxygen therapy treatment Date Initiated: 10/05/2021 Target Resolution Date: 11/06/2021 Goal Status: Active Signs and symptoms of seizure will be recognized and promptly addressed ; seizing patients will suffer no harm Date Initiated: 10/05/2021 Target Resolution Date: 11/06/2021 Goal Status: Active Interventions: Administer the correct therapeutic gas delivery based on the patients needs and limitations, per physician order Assess and provide for patients comfort related to the hyperbaric environment and equalization of middle ear Assess patient for any history of confinement anxiety Notes: Electronic Signature(s) Signed: 10/05/2021 5:15:48 PM By: Deon Pilling RN, BSN Entered By: Deon Pilling on 10/05/2021 08:51:58 -------------------------------------------------------------------------------- Pain Assessment Details Patient Name: Date of Service: HO [name], [name] 10/05/2021 8:45 A M Medical Record Number: [id number] Patient Account Number: [id number] Date of Birth/Sex: Treating RN: 12-Mar-1956 (66 y.o. [name] Primary Care Sunya Humbarger:  [name] Other Clinician: Referring Aronda Burford: Treating Dannae Kato/Extender: Tharon Aquas in Treatment: 4 Active Problems Location of Pain Severity and Description of Pain Patient Has Paino No Site Locations Rate the pain. Current Pain Level: 0 Pain Management and Medication Current Pain Management: Medication: No Cold Application: No Rest: No Massage: No Activity: No T.E.N.S.: No Heat Application: No Leg drop or elevation: No Is the Current Pain Management Adequate: Adequate How does your wound impact your activities of daily livingo Sleep: No Bathing: No Appetite: No Relationship With Others: No Bladder Continence: No Emotions: No Bowel Continence: No Work: No Toileting: No Drive: No Dressing: No Hobbies: No Engineer, maintenance) Signed: 10/05/2021 5:15:48 PM By: Deon Pilling RN, BSN Entered By: Deon Pilling on 10/05/2021 08:51:08 -------------------------------------------------------------------------------- Patient/Caregiver Education Details Patient Name: Date of Service: HO [name], [name] 3/6/2023andnbsp8:45 A M Medical Record Number: [id number] Patient Account Number: [id number] Date of Birth/Gender: Treating RN: [birth year] (66 y.o. [name] Primary Care Physician: [name] Other Clinician: Referring Physician: Treating Physician/Extender: Tharon Aquas in Treatment: 4 Education Assessment Education Provided To: Patient Education Topics Provided Wound/Skin Impairment: Handouts: Skin Care Do's and Dont's Methods: Explain/Verbal Responses: Reinforcements needed Electronic Signature(s) Signed: 10/05/2021 5:15:48 PM By: Deon Pilling RN, BSN Entered By: Deon Pilling on 10/05/2021 08:52:16 -------------------------------------------------------------------------------- Vitals Details Patient Name: Date of Service: [name], [name] 10/05/2021 8:45 A M Medical  Record Number: [id number] Patient Account Number: [id number] Date of Birth/Sex: Treating RN: September 25, 1955 (66 y.o. [name], [name] Primary Care Clayson Riling: [name] Other Clinician: Referring [name]: Treating Laelani Vasko/Extender: Tharon Aquas in Treatment: 4 Vital Signs Time Taken: 08:50 Temperature (F): 97.9 Height (in): 62 Pulse (bpm): 76 Weight (lbs): 160 Respiratory Rate (breaths/min): 20 Body Mass Index (BMI): 29.3 Blood Pressure (mmHg): 165/77 Reference Range: 80 - 120 mg / dl Electronic Signature(s) Signed: 10/05/2021 5:15:48 PM By: Deon Pilling RN, BSN Entered By: Deon Pilling on 10/05/2021 08:50:58

## 2021-10-05 NOTE — Progress Notes (Addendum)
[name], [name] ([id number]) Visit Report for 10/05/2021 Chief Complaint Document Details Patient Name: Date of Service: [place], [name] 10/05/2021 8:45 A M Medical Record Number: [id number] Patient Account Number: [id number] Date of Birth/Sex: Treating RN: [birth year] (66 y.o. 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 Electronic Signature(s) Signed: 10/05/2021 9:19:16 AM By: [name] DO Entered By: [name] on 10/05/2021 09:12:06 -------------------------------------------------------------------------------- HPI Details Patient Name: Date of Service: [name], [name] 10/05/2021 8:45 A M Medical Record Number: [id number] Patient Account Number: [id number] Date of Birth/Sex: Treating RN: 03/03/1956 (66 y.o. F) Primary Care Provider: [name] Other Clinician: Referring Provider: Treating Provider/Extender: Tharon Aquas in Treatment: 4 History of Present Illness HPI Description: 09/03/2021 Patient has a history of ovarian cancer in 2006 treated with chemotherapy carbo/T axol. In 2016 she had an MRI that showed a pelvic mass without invasion into the bladder but abutting to the rectum. 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 She had a radical upper vaginectomy and low anterior resection with colostomy. From 01/29/2015 to 03/10/2015 she received adjunct of radiation. She subsequently developed complications of her ureters and  bladder and she ultimately had the bladder removed. 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 She reports that since her radiation she has also developed vaginal and rectal bleeding It is chronic but improves with the use of doxycycline. She also has a history of diagnosed invasive ductal carcinoma of the left breast in 2017 and underwent bilateral mastectomy. 3/6; patient presents for her first [name] treatment. She continues to have rectal and vaginal bleeding. She has no issues or complaints today. Electronic Signature(s) Signed: 10/05/2021 9:19:16 AM By: [name] DO Entered By: [name] on 10/05/2021 09:12:37 -------------------------------------------------------------------------------- Physical Exam Details Patient Name: Date of Service: HO [name] Cagey, [name] 10/05/2021 8:45 A M Medical Record Number: [id number] Patient Account Number: [id number] Date of Birth/Sex: Treating RN: [birth year] (66 y.o. F) Primary Care Provider: [name] Other Clinician: Referring Provider: Treating Provider/Extender: Tharon Aquas in Treatment: 4 Constitutional respirations regular, non-labored and within target range for patient.. Ears, Nose, Mouth, and Throat no gross abnormality of ear auricles or external auditory canals. Respiratory clear to auscultation bilaterally. Cardiovascular regular rate and rhythm with normal S1, S2. Psychiatric pleasant and cooperative. Notes Tympanic membranes easily visualized to ears bilaterally Electronic Signature(s) Signed: 10/05/2021 9:19:16 AM By: [name] DO Entered By: [name] on 10/05/2021 09:14:34 -------------------------------------------------------------------------------- Physician Orders Details Patient Name: Date of Service: HO [name] Cagey, [name] 10/05/2021 8:45 A M Medical Record Number: [id number] Patient Account Number: [id number] Date of Birth/Sex: Treating  RN: [birth year] (66 y.o. [name] Primary Care Provider: [name] Other Clinician: Referring Provider: Treating Provider/Extender: Tharon Aquas in Treatment: 4 Verbal / Phone Orders: No Diagnosis Coding Follow-up Appointments Return appointment in 1 month. - for 30 day evaluation once hyberbarics has started. Hyperbaric Oxygen Therapy Evaluate for [name] Therapy Indication: - Soft tissue injury from radiation If appropriate for treatment, begin HBOT per protocol: 2.0 ATA for 90 Minutes with 2 Five (  5) Minute A Breaks ir Total Number of Treatments: - 40 One treatments per day (delivered Monday through Friday unless otherwise specified in Special Instructions below): Finger stick Blood Glucose Pre- and Post- HBOT Treatment. Follow Hyperbaric Oxygen Glycemia Protocol A frin (Oxymetazoline HCL) 0.05% nasal spray - 1 spray in both nostrils daily as needed prior to [name] treatment for difficulty clearing ears GLYCEMIA INTERVENTIONS PROTOCOL PRE-[name] GLYCEMIA INTERVENTIONS ACTION INTERVENTION Obtain pre-[name] capillary blood glucose (ensure 1 physician order is in chart). A. Notify [name] physician and await physician orders. 2 If result is 70 mg/dl or below: B. If the result meets the hospital definition of a critical result, follow hospital policy. A. Give patient an 8 ounce Glucerna Shake, an 8 ounce Ensure, or 8 ounces of a Glucerna/Ensure equivalent dietary supplement*. B. Wait 30 minutes. If result is 71 mg/dl to 130 mg/dl: C. Retest patients capillary blood glucose (CBG). D. If result greater than or equal to 110 mg/dl, proceed with [name]. If result less than 110 mg/dl, notify [name] physician and consider holding [name]. If result is 131 mg/dl to 249 mg/dl: A. Proceed with [name]. A. Notify [name] physician and await physician orders. B. It is recommended to hold [name] and do If result is 250 mg/dl or greater: blood/urine ketone testing. C. If  the result meets the hospital definition of a critical result, follow hospital policy. POST-[name] GLYCEMIA INTERVENTIONS ACTION INTERVENTION Obtain post [name] capillary blood glucose (ensure 1 physician order is in chart). A. Notify [name] physician and await physician orders. 2 If result is 70 mg/dl or below: B. If the result meets the hospital definition of a critical result, follow hospital policy. A. Give patient an 8 ounce Glucerna Shake, an 8 ounce Ensure, or 8 ounces of a Glucerna/Ensure equivalent dietary supplement*. B. Wait 15 minutes for symptoms of If result is 71 mg/dl to 100 mg/dl: hypoglycemia (i.e. nervousness, anxiety, sweating, chills, clamminess, irritability, confusion, tachycardia or dizziness). C. If patient asymptomatic, discharge patient. If patient symptomatic, repeat capillary blood glucose (CBG) and notify [name] physician. If result is 101 mg/dl to 249 mg/dl: A. Discharge patient. A. Notify [name] physician and await physician orders. B. It is recommended to do blood/urine ketone If result is 250 mg/dl or greater: testing. C. If the result meets the hospital definition of a critical result, follow hospital policy. *Juice or candies are NOT equivalent products. If patient refuses the Glucerna or Ensure, please consult the hospital dietitian for an appropriate substitute. Electronic Signature(s) Signed: 10/05/2021 9:19:16 AM By: [name] DO Entered By: [name] on 10/05/2021 09:14:51 -------------------------------------------------------------------------------- Problem List Details Patient Name: Date of Service: HO [name] Cagey, [name] 10/05/2021 8:45 A M Medical Record Number: [id number] Patient Account Number: [id number] Date of Birth/Sex: Treating RN: 1956/05/28 (66 y.o. F) Primary Care Provider: [name] Other Clinician: Referring Provider: Treating Provider/Extender: Tharon Aquas in Treatment:  4 Active Problems ICD-10 Encounter Code Description Active Date MDM Diagnosis N30.41 Irradiation cystitis with hematuria 10/05/2021 No Yes 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 09/03/2021 No Yes C50.212 Malignant neoplasm of upper-inner quadrant of left female breast 09/03/2021 No Yes 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, unspecified 09/03/2021 No Yes N95.0 Postmenopausal bleeding 09/03/2021 No Yes D70.9 Neutropenia, unspecified 09/03/2021 No Yes E11.9 Type 2 diabetes mellitus without complications 12/07/3218 No Yes Inactive Problems Resolved Problems Electronic Signature(s) Signed: 10/05/2021 9:19:16 AM By: [name] DO Entered By: [name] on 10/05/2021 09:11:33 -------------------------------------------------------------------------------- Progress Note Details Patient Name: Date of Service:  [name], [name] 10/05/2021 8:45 A M Medical Record Number: [id number] Patient Account Number: [id number] Date of Birth/Sex: Treating RN: 08-20-1955 (66 y.o. F) Primary Care Provider: [name] Other Clinician: Referring Provider: Treating Provider/Extender: Tharon Aquas in Treatment: 4 Subjective Chief Complaint Information obtained from Patient Patient with history of pelvic mass positive for endometrioid carcinoma status postresection and radiation therapy that presents today for discussion of [name] therapy due to continued symptoms of vaginal and rectal bleeding. History of Present Illness (HPI) 09/03/2021 Patient has a history of ovarian cancer in 2006 treated with chemotherapy carbo/T axol. In 2016 she had an MRI that showed a pelvic mass without invasion into the bladder but abutting to the rectum. 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 She had a radical upper vaginectomy and low anterior resection with  colostomy. From 01/29/2015 to 03/10/2015 she received adjunct of radiation. She subsequently developed complications of her ureters and bladder and she ultimately had the bladder removed. 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 She reports that since her radiation she has also developed vaginal and rectal bleeding It is chronic but improves with the use of doxycycline. She also has a history of diagnosed invasive ductal carcinoma of the left breast in 2017 and underwent bilateral mastectomy. 3/6; patient presents for her first [name] treatment. She continues to have rectal and vaginal bleeding. She has no issues or complaints today. Patient History Information obtained from Patient, Chart. Family History Cancer - Mother,Father,Paternal Grandparents, Diabetes - Father,Siblings, Heart Disease - Father, Hypertension - Mother,Father,Siblings, No family history of Hereditary Spherocytosis, Kidney Disease, Lung Disease, Seizures, Stroke, Thyroid Problems, Tuberculosis. Social History Marital Status - Married, Alcohol Use - Never, Drug Use - No History, Caffeine Use - Never. Medical History Eyes Denies history of Cataracts, Glaucoma, Optic Neuritis Ear/Nose/Mouth/Throat Denies history of Chronic sinus problems/congestion, Middle ear problems Hematologic/Lymphatic Patient has history of Anemia Denies history of Hemophilia, Human Immunodeficiency Virus, Lymphedema, Sickle Cell Disease Respiratory Denies history of Aspiration, Asthma, Chronic Obstructive Pulmonary Disease (COPD), Pneumothorax, Sleep Apnea, Tuberculosis Cardiovascular Patient has history of Arrhythmia - A. Fib, Hypertension Denies history of Angina, Congestive Heart Failure, Coronary Artery Disease, Deep Vein Thrombosis, Hypotension, Myocardial Infarction, Peripheral Arterial Disease, Peripheral Venous Disease, Phlebitis, Vasculitis Gastrointestinal Denies history of Cirrhosis , Colitis, Crohnoos, Hepatitis  A, Hepatitis B, Hepatitis C Endocrine Patient has history of Type II Diabetes Denies history of Type I Diabetes Genitourinary Patient has history of End Stage Renal Disease - stage III Immunological Denies history of Lupus Erythematosus, Raynaudoos, Scleroderma Integumentary (Skin) Denies history of History of Burn Musculoskeletal Denies history of Gout, Rheumatoid Arthritis, Osteoarthritis, Osteomyelitis Neurologic Denies history of Dementia, Neuropathy, Quadriplegia, Paraplegia, Seizure Disorder Oncologic Patient has history of Received Chemotherapy - 2006 ovarian Ca, 2016 Ca colonic mucosa and vagina mucosa,2017 Breast Ca L; bilateral mastectomy, 2017 ductal carcinoma, Received Radiation - 01/2015-03/2015 pelivs Psychiatric Denies history of Anorexia/bulimia, Confinement Anxiety Hospitalization/Surgery History - colostomy. - bilateral mastectomy. - vaginectomy. - total hysterectomy. - urostomy. 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 Objective Constitutional respirations regular, non-labored and within target range for patient.. Vitals Time Taken: 8:50 AM, Height: 62 in, Weight: 160 lbs, BMI: 29.3, Temperature: 97.9 F, Pulse: 76 bpm, Respiratory Rate: 20 breaths/min, Blood Pressure: 165/77 mmHg. Ears, Nose, Mouth, and Throat no gross abnormality of ear auricles or  external auditory canals. Respiratory clear to auscultation bilaterally. Cardiovascular regular rate and rhythm with normal S1, S2. Psychiatric pleasant and cooperative. General Notes: Tympanic membranes easily visualized to ears bilaterally Assessment Active Problems ICD-10 Irradiation cystitis with hematuria Personal history of malignant neoplasm of ovary Malignant neoplasm of upper-inner quadrant of left female breast Malignant neoplasm of corpus uteri, unspecified Postmenopausal bleeding Neutropenia, unspecified Type 2  diabetes mellitus without complications Patient has been approved for [name] therapy for radiation damage to the pelvis status post uterine and ovarian cancer. She continues to have rectal bleeding and vaginal bleeding. She has no issues or complaints today. Next assessment will be in 30 days or sooner as needed. Plan Follow-up Appointments: Return appointment in 1 month. - for 30 day evaluation once hyberbarics has started. Hyperbaric Oxygen Therapy: Evaluate for [name] Therapy Indication: - Soft tissue injury from radiation If appropriate for treatment, begin HBOT per protocol: 2.0 ATA for 90 Minutes with 2 Five (5) Minute Air Breaks T Number of Treatments: - 40 otal One treatments per day (delivered Monday through Friday unless otherwise specified in Special Instructions below): Finger stick Blood Glucose Pre- and Post- HBOT Treatment. Follow Hyperbaric Oxygen Glycemia Protocol Afrin (Oxymetazoline HCL) 0.05% nasal spray - 1 spray in both nostrils daily as needed prior to [name] treatment for difficulty clearing ears 1. Start [name] therapy 2. Follow-up in 30 days for evaluation Electronic Signature(s) Signed: 10/05/2021 9:19:16 AM By: [name] DO Entered By: [name] on 10/05/2021 09:18:24 -------------------------------------------------------------------------------- HxROS Details Patient Name: Date of Service: [name], [name] 10/05/2021 8:45 A M Medical Record Number: [id number] Patient Account Number: [id number] Date of Birth/Sex: Treating RN: 1956-06-13 (67 y.o. 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 Eyes Medical History: Negative for: Cataracts; Glaucoma; Optic  Neuritis Ear/Nose/Mouth/Throat Medical History: Negative for: Chronic sinus problems/congestion; Middle ear problems Hematologic/Lymphatic Medical History: Positive for: Anemia Negative for: Hemophilia; Human Immunodeficiency Virus; Lymphedema; Sickle Cell Disease Respiratory Medical History: Negative for: Aspiration; Asthma; Chronic Obstructive Pulmonary Disease (COPD); Pneumothorax; Sleep Apnea; Tuberculosis Cardiovascular Medical History: Positive for: Arrhythmia - A. Fib; Hypertension Negative for: Angina; Congestive Heart Failure; Coronary Artery Disease; Deep Vein Thrombosis; Hypotension; Myocardial Infarction; Peripheral Arterial Disease; Peripheral Venous Disease; Phlebitis; Vasculitis Gastrointestinal Medical History: Negative for: Cirrhosis ; Colitis; Crohns; Hepatitis A; Hepatitis B; Hepatitis C Endocrine Medical History: Positive for: Type II Diabetes Negative for: Type I Diabetes Time with diabetes: 2005 Treated with: Oral agents, Diet Blood sugar tested every day: Yes Tested : daily Genitourinary Medical History: Positive for: End Stage Renal Disease - stage III Immunological Medical History: Negative for: Lupus Erythematosus; Raynauds; Scleroderma Integumentary (Skin) Medical History: Negative for: History of Burn Musculoskeletal Medical History: Negative for: Gout; Rheumatoid Arthritis; Osteoarthritis; Osteomyelitis Neurologic Medical History: Negative for: Dementia; Neuropathy; Quadriplegia; Paraplegia; Seizure Disorder Oncologic Medical History: Positive for: Received Chemotherapy - 2006 ovarian Ca, 2016 Ca colonic mucosa and vagina mucosa,2017 Breast Ca L; bilateral mastectomy, 2017 ductal carcinoma; Received Radiation - 01/2015-03/2015 pelivs Psychiatric Medical History: Negative for: Anorexia/bulimia; Confinement Anxiety Immunizations Pneumococcal Vaccine: Received Pneumococcal Vaccination: No Implantable Devices No devices  added Hospitalization / Surgery History Type of Hospitalization/Surgery colostomy bilateral mastectomy vaginectomy total hysterectomy urostomy Family and Social History Cancer: Yes - Mother,Father,Paternal Grandparents; Diabetes: Yes - Father,Siblings; Heart Disease: Yes - Father; Hereditary Spherocytosis: No; Hypertension: Yes -  Mother,Father,Siblings; Kidney Disease: No; Lung Disease: No; Seizures: No; Stroke: No; Thyroid Problems: No; Tuberculosis: No; Marital Status - Married; Alcohol Use: Never; Drug Use: No History; Caffeine Use: Never; Financial Concerns: No; Food, Clothing or Shelter Needs: No; Support System Lacking: No; Transportation Concerns: No Electronic Signature(s) Signed: 10/05/2021 9:19:16 AM By: [name] DO Entered By: [name] on 10/05/2021 09:12:45 -------------------------------------------------------------------------------- SuperBill Details Patient Name: Date of Service: [name], [name] 10/05/2021 Medical Record Number: [id number] Patient Account Number: [id number] Date of Birth/Sex: Treating RN: 1956/01/27 (66 y.o. [name], [name] Primary Care Provider: [name] Other Clinician: Referring Provider: Treating Provider/Extender: Tharon Aquas in Treatment: 4 Diagnosis Coding ICD-10 Codes Code Description N30.41 Irradiation cystitis with hematuria 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 C50.212 Malignant neoplasm of upper-inner quadrant of left female breast 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, unspecified N95.0 Postmenopausal bleeding D70.9 Neutropenia, unspecified E11.9 Type 2 diabetes mellitus without complications Facility Procedures CPT4 Code: 92119417 Description: 40814 - WOUND CARE VISIT-LEV 2 EST PT Modifier: Quantity: 1 Electronic Signature(s) Signed: 10/05/2021 4:23:17 PM By: [name] DO Previous Signature: 10/05/2021 9:19:16 AM Version By: [name]  DO Entered By: [name] on 10/05/2021 12:02:50

## 2021-10-05 NOTE — Progress Notes (Addendum)
NIYLA, MARONE ([id number]) ?Visit Report for 10/05/2021 ?Arrival Information Details ?Patient Name: Date of Service: ?HO [place], [name] 10/05/2021 9:30 A M ?Medical Record Number: [id number] ?Patient Account Number: [id number] ?Date of Birth/Sex: Treating RN: ?September 06, 1955 (66 y.o. [name], [name] ?Primary Care Maryclare Nydam: [name] ?Other Clinician: [name] ?Referring Odeth Bry: ?Treating Meeya Goldin/Extender: [name] ?[name] ?[name]: 4 ?Visit Information History Since Last Visit ?All ordered tests and consults were completed: Yes ?Patient Arrived: Ambulatory ?Added or deleted any medications: No ?Arrival Time: 09:15 ?Any new allergies or adverse reactions: No ?Accompanied By: self ?Had a fall or experienced change in No ?Transfer Assistance: None ?activities of daily living that may affect ?Patient Identification Verified: Yes ?risk of falls: ?Secondary Verification Process Completed: Yes ?Signs or symptoms of abuse/neglect since last visito No ?Patient Requires Transmission-Based Precautions: No ?Hospitalized since last visit: No ?Patient Has Alerts: Yes ?Implantable device outside of the clinic excluding No ?Patient Alerts: Patient on Blood Thinner cellular tissue based products placed in the center ?since last visit: ?Pain Present Now: No ?Electronic Signature(s) ?Signed: 10/05/2021 11:24:08 AM By: [name] CHT EMT BS ?, , ?Entered By: [name] on 10/05/2021 10:42:32 ?-------------------------------------------------------------------------------- ?Encounter Discharge Information Details ?Patient Name: Date of Service: ?HO [place], [name] 10/05/2021 9:30 A M ?Medical Record Number: [id number] ?Patient Account Number: [id number] ?Date of Birth/Sex: Treating RN: ?01-Aug-1956 (66 y.o. [name], [name] ?Primary Care Latish Toutant: [name] ?Other Clinician: [name] ?Referring Wynonia Medero: ?Treating Yordy Matton/Extender: [name] ?[name] ?[name]: 4 ?Encounter Discharge Information Items ?Discharge Condition: Stable ?Ambulatory Status: Ambulatory ?Discharge Destination: Home ?Transportation: Private Auto ?Accompanied By: self ?Schedule Follow-up Appointment: No ?Clinical Summary of Care: ?Electronic Signature(s) ?Signed: 10/06/2021 1:56:43 PM By: [name] CHT EMT BS ?, , ?Entered By: [name] on 10/05/2021 14:54:04 ?-------------------------------------------------------------------------------- ?Vitals Details ?Patient Name: ?Date of Service: ?[name], [name] 10/05/2021 9:30 A M ?Medical Record Number: [id number] ?Patient Account Number: [id number] ?Date of Birth/Sex: ?Treating RN: ?April 20, 1956 (66 y.o. [name], [name] ?Primary Care Sarahi Borland: [name] ?Other Clinician: [name] ?Referring Leinaala Catanese: ?Treating Lorisa Scheid/Extender: [name] ?[name] ?[name]: 4 ?Vital Signs ?Time Taken: 09:36 ?Temperature (??F): 97.9 ?Height (in): 62 ?Pulse (bpm): 76 ?Weight (lbs): 160 ?Respiratory Rate (breaths/min): 20 ?Body Mass Index (BMI): 29.3 ?Blood Pressure (mmHg): 126/81 ?Capillary Blood Glucose (mg/dl): 149 ?Reference Range: 80 - 120 mg / dl ?Notes ?Vital signs checked in the wound care encounter visit. ?Electronic Signature(s) ?Signed: 10/05/2021 11:24:08 AM By: [name] CHT EMT BS ?, , ?Entered By: [name] on 10/05/2021 10:45:05 ?

## 2021-10-06 ENCOUNTER — Encounter (HOSPITAL_BASED_OUTPATIENT_CLINIC_OR_DEPARTMENT_OTHER): Payer: BC Managed Care – PPO | Admitting: Internal Medicine

## 2021-10-06 DIAGNOSIS — Z8543 Personal history of malignant neoplasm of ovary: Secondary | ICD-10-CM

## 2021-10-06 DIAGNOSIS — D709 Neutropenia, unspecified: Secondary | ICD-10-CM | POA: Diagnosis not present

## 2021-10-06 DIAGNOSIS — Y842 Radiological procedure and radiotherapy as the cause of abnormal reaction of the patient, or of later complication, without mention of misadventure at the time of the procedure: Secondary | ICD-10-CM | POA: Diagnosis not present

## 2021-10-06 DIAGNOSIS — C50212 Malignant neoplasm of upper-inner quadrant of left female breast: Secondary | ICD-10-CM

## 2021-10-06 DIAGNOSIS — C549 Malignant neoplasm of corpus uteri, unspecified: Secondary | ICD-10-CM | POA: Diagnosis not present

## 2021-10-06 DIAGNOSIS — N3041 Irradiation cystitis with hematuria: Secondary | ICD-10-CM | POA: Diagnosis not present

## 2021-10-06 DIAGNOSIS — N950 Postmenopausal bleeding: Secondary | ICD-10-CM | POA: Diagnosis not present

## 2021-10-06 DIAGNOSIS — L59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: Secondary | ICD-10-CM | POA: Diagnosis not present

## 2021-10-06 DIAGNOSIS — Z853 Personal history of malignant neoplasm of breast: Secondary | ICD-10-CM | POA: Diagnosis not present

## 2021-10-06 DIAGNOSIS — E119 Type 2 diabetes mellitus without complications: Secondary | ICD-10-CM | POA: Diagnosis not present

## 2021-10-06 LAB — GLUCOSE, CAPILLARY
Glucose-Capillary: 140 mg/dL — ABNORMAL HIGH (ref 70–99)
Glucose-Capillary: 110 mg/dL — ABNORMAL HIGH (ref 70–99)

## 2021-10-06 NOTE — Progress Notes (Signed)
[name], [name] ([id number]) ?Visit Report for 10/06/2021 ?Arrival Information Details ?Patient Name: Date of Service: ?[name], [name] 10/06/2021 9:00 A M ?Medical Record Number: [id number] ?Patient Account Number: [id number] ?Date of Birth/Sex: Treating RN: ?02/15/1956 (66 y.o. [name], [name] ?Primary Care [name]: [name] ?Other Clinician: Donavan [name] ?Referring [name]: ?Treating [name]/Extender: [name] ?[name] ?[name]: 4 ?Visit Information History Since Last Visit ?All ordered tests and consults were completed: Yes ?Patient Arrived: Ambulatory ?Added or deleted any medications: No ?Arrival Time: 09:05 ?Any new allergies or adverse reactions: No ?Accompanied By: self ?Had a fall or experienced change in No ?Transfer Assistance: None ?activities of daily living that may affect ?Patient Identification Verified: Yes ?risk of falls: ?Secondary Verification Process Completed: Yes ?Signs or symptoms of abuse/neglect since last visito No ?Patient Requires Transmission-Based Precautions: No ?Hospitalized since last visit: No ?Patient Has Alerts: Yes ?Implantable device outside of the clinic excluding No ?Patient Alerts: Patient on Blood Thinner cellular tissue based products placed in the center ?since last visit: ?Pain Present Now: No ?Electronic Signature(s) ?Signed: 10/06/2021 1:56:43 PM By: [name] CHT EMT BS ?, , ?Entered By: [name] on 10/06/2021 10:49:36 ?-------------------------------------------------------------------------------- ?Encounter Discharge Information Details ?Patient Name: Date of Service: ?[name], [name] 10/06/2021 9:00 A M ?Medical Record Number: [id number] ?Patient Account Number: [id number] ?Date of Birth/Sex: Treating RN: ?1955-09-11 (66 y.o. [name], [name] ?Primary Care [name]: [name] ?Other Clinician: Donavan [name] ?Referring [name]: ?Treating [name]/Extender: [name] ?[name] ?[name]: 4 ?Encounter Discharge Information Items ?Discharge Condition: Stable ?Ambulatory Status: Ambulatory ?Discharge Destination: Home ?Transportation: Private Auto ?Accompanied By: self ?Schedule Follow-up Appointment: No ?Clinical Summary of Care: ?Electronic Signature(s) ?Signed: 10/06/2021 1:56:43 PM By: [name] CHT EMT BS ?, , ?Entered By: [name] on 10/06/2021 13:56:15 ?-------------------------------------------------------------------------------- ?Vitals Details ?Patient Name: ?Date of Service: ?[name] [name], [name] 10/06/2021 9:00 A M ?Medical Record Number: [id number] ?Patient Account Number: [id number] ?Date of Birth/Sex: ?Treating RN: ?02-15-1956 (66 y.o. [name], [name] ?Primary Care [name]: [name] ?Other Clinician: Donavan [name] ?Referring Jabree Pernice: ?Treating [name]/Extender: [name] ?[name] ?[name]: 4 ?Vital Signs ?Time Taken: 09:21 ?Temperature (??F): 97.9 ?Height (in): 62 ?Pulse (bpm): 61 ?Weight (lbs): 160 ?Respiratory Rate (breaths/min): 18 ?Body Mass Index (BMI): 29.3 ?Blood Pressure (mmHg): 134/62 ?Capillary Blood Glucose (mg/dl): 140 ?Reference Range: 80 - 120 mg / dl ?Electronic Signature(s) ?Signed: 10/06/2021 1:56:43 PM By: [name] CHT EMT BS ?, , ?Entered By: [name] on 10/06/2021 10:50:16 ?

## 2021-10-06 NOTE — Progress Notes (Signed)
ALISAN, DOKES ([id number]) Visit Report for 10/06/2021 Physician Orders Details Patient Name: Date of Service: [name], [name] B. 10/06/2021 9:00 A M Medical Record Number: [id number] Patient Account Number: [id number] Date of Birth/Sex: Treating RN: 1956-01-22 (66 y.o. 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 - (ICD10 N30.41 - Irradiation cystitis with hematuria) Electronic Signature(s) Signed: 10/06/2021 4:29:59 PM By: [name] DO Signed: 10/06/2021 5:16:13 PM By: Deon Pilling RN, BSN Entered By: Deon Pilling on 10/06/2021 11:58:53 Prescription 10/06/2021 -------------------------------------------------------------------------------- [name], [name] B. [name] DO Patient Name: Provider: May 27, 1956 [id number] Date of Birth: NPI#: [id number] Sex: DEA #: [phone] Phone #: License #: [place] Patient Address: [place], [place] [address] [place], [place] [address] [phone] Allergies penicillin; cefaclor; erythromycin base; adhesive tape; Ultram; trimethoprim; Zarxio; Cephalosporins; fluconazole; neomycin; oxycodone; pectin; Septra;  Sulfa (Sulfonamide Antibiotics) Provider's Orders Ear, Nose Throat referral - ICD10: N30.41 - Refer to ENT for evaluation right ear pain following hyberbarics, please evaluate both ears; potential for ET tube placement. Hand Signature: Date(s): Electronic Signature(s) Signed: 10/06/2021 4:29:59 PM By: [name] DO Signed: 10/06/2021 5:16:13 PM By: Deon Pilling RN, BSN Entered By: Deon Pilling on 10/06/2021 11:58:53 -------------------------------------------------------------------------------- Problem List Details Patient Name: Date of Service: [name], [name] 10/06/2021 9:00 A M Medical Record Number: [id number] Patient Account Number: [id number] Date of Birth/Sex: Treating RN: 04-26-1956 (66 y.o. 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 10/06/2021 Medical Record Number: [id number] Patient Account Number: [id number] Date of  Birth/Sex: Treating RN: June 14, 1956 (66 y.o. 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, unspecified N95.0 Postmenopausal bleeding D70.9 Neutropenia, unspecified E11.9 Type 2 diabetes mellitus without complications Facility Procedures CPT4 Code: [id number] Description: G0277-(Facility Use Only) HBOT full body chamber, 53mn , ICD-10 Diagnosis Description N30.41 Irradiation cystitis with  hematuria 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 C50.212 Malignant neoplasm of upper-inner quadrant of  left female breast Modifier: Quantity: 5 Physician Procedures : CPT4 Code Description Modifier [id number] 11735 - WC PHYS HYPERBARIC OXYGEN THERAPY ICD-10 Diagnosis Description 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

## 2021-10-06 NOTE — Progress Notes (Signed)
MORRISON, MASSER ([id number]) Visit Report for 10/05/2021 SuperBill Details Patient Name: Date of Service: [place], [place] 10/05/2021 Medical Record Number: [id number] Patient Account Number: [id number] Date of Birth/Sex: Treating RN: 1956/01/12 (66 y.o. 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, unspecified N95.0 Postmenopausal bleeding D70.9 Neutropenia, unspecified E11.9 Type 2 diabetes mellitus without complications Facility Procedures CPT4 Code Description Modifier Quantity [id number] G0277-(Facility Use Only) HBOT full body chamber, 79mn , 5 ICD-10 Diagnosis Description 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

## 2021-10-06 NOTE — Progress Notes (Signed)
JOLAN, MEALOR ([id number]) ?Visit Report for 10/06/2021 ?[name] Details ?Patient Name: Date of Service: ?[name], [name] 10/06/2021 9:00 A M ?Medical Record Number: [id number] ?Patient Account Number: [id number] ?Date of Birth/Sex: Treating RN: ?11/26/1955 (66 y.o. 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 ?[name] Treatment Details ?Treatment Number: 2 ?Patient Type: Outpatient ?Chamber Type: Monoplace ?Chamber Serial #: [id number] ?Treatment Protocol: 2.0 ATA with 90 minutes oxygen, with two 5 minute air breaks ?Treatment Details ?Compression Rate Down: 1.0 psi / minute De-Compression Rate Up: ?A breaks and breathing ?ir ?Compress Tx Pressure periods Decompress Decompress ?Begins Reached (leave unused spaces Begins Ends ?blank) ?Chamber Pressure (ATA '1 2 2 2 2 2 '$ --2 1 ?) ?Clock Time (24 hr) 09:54 10:17 10:47 10:52 11:22 11:27 - - 11:57 12:15 ?Treatment Length: 141 (minutes) ?Treatment Segments: 5 ?Vital Signs ?Capillary Blood Glucose Reference Range: 80 - 120 mg / dl ?[name] Diabetic Blood Glucose Intervention Range: <131 mg/dl or >249 mg/dl ?Type: Time Vitals Blood Pulse: Respiratory Temperature: Capillary Blood Glucose Pulse Action ?Taken: ?Pressure: ?Rate: ?Glucose (mg/dl): Meter #: Oximetry (%) Taken: ?Pre 09:21 134/62 61 18 97.9 140 2 ?Post 12:18 140/62 58 18 97.6 110 2 discharge per protocol >101 mg/dL ?Treatment Response ?Treatment Toleration: Well ?Treatment Completion Status: Treatment Completed without Adverse Event ?Treatment Notes ?Patient spoke to physician about her ears prior to treatment. A referral was [facility] to [facility]  Patient was able to tolerate compression of chamber for ?. ?treatment today at 1 psi/min without any stops. Patient was able to tolerate the decompression phase of treatment without incident, as well. ?Jerrye Seebeck Notes ?Dr. Kearney Hard patient today due to complaint of right ear pain. She started [name] therapy yesterday and has completed 1 session. She denies ?drainage from the ear. She denies muffled sounds. On exam I could not clearly visualize the tympanic membrane due to anatomy obstruction. It was not ?earwax however it was hard to tell what exactly was in the canal. The tympanic membrane that could be visualized appeared intact without issues. I ?recommended evaluation by ENT . ?Physician [name] Attestation: ?I certify that I supervised this [name] treatment in accordance with Medicare ?guidelines. A trained emergency response team is readily available per Yes ?hospital policies and procedures. ?Continue HBOT as ordered. Yes ?Electronic Signature(s) ?Signed: 10/06/2021 4:29:59 PM By: [name] DO ?Previous Signature: 10/06/2021 1:56:43 PM Version By: [name] CHT EMT BS ?, , ?Entered By: [name] on 10/06/2021 16:09:59 ?-------------------------------------------------------------------------------- ?[name] Safety Checklist Details ?Patient Name: ?Date of Service: ?[name] [name], [name] 10/06/2021 9:00 A M ?Medical Record Number: [id number] ?Patient Account Number: [id number] ?Date of Birth/Sex: ?Treating RN: ?[birth year] (66 y.o. [name], [name] ?Primary Care Jackob Crookston: [name] ?Other Clinician: [name] ?Referring Zuriyah Shatz: ?Treating Lashuna Tamashiro/Extender: [name] ?[name] ?[name]: 4 ?[name] Safety Checklist Items ?Safety Checklist ?Consent Form Signed ?Patient voided / foley secured and emptied ?When did you last eato 0800 ?Last dose of injectable or oral agent Ozempic taken this past Saturday ?Ostomy pouch emptied and vented if applicable Urostomy connected  to collection bag. ?All implantable devices assessed, documented and approved Silicone Breast Implants ?Intravenous access site secured and place ?NA ?Valuables secured ?Linens and cotton and cotton/polyester blend (less than 51%  polyester) ?Personal oil-based products / skin lotions / body lotions removed ?Wigs or hairpieces removed ?NA ?Smoking or tobacco materials removed ?NA ?Books / newspapers / magazines / loose paper removed ?Cologne, aftershave, perfume and deodorant removed ?Jewelry removed (may wrap wedding band) ?Make-up removed ?Hair care products removed ?Battery operated devices (external) removed ?Heating patches and chemical warmers removed ?Titanium eyewear removed ?NA ?Nail polish cured greater than 10 hours ?NA ?Casting material cured greater than 10 hours ?NA ?Hearing aids removed ?NA ?Loose dentures or partials removed ?NA ?Prosthetics have been removed ?NA ?Patient demonstrates correct use of air break device (if applicable) ?Patient concerns have been addressed ?Patient grounding bracelet on and cord attached to chamber ?Specifics for Inpatients (complete in addition to above) ?Medication sheet sent with patient ?NA ?Intravenous medications needed or due during therapy sent with patient ?NA ?Drainage tubes (e.g. nasogastric tube or chest tube secured and vented) ?NA ?Endotracheal or Tracheotomy tube secured ?NA ?Cuff deflated of air and inflated with saline ?NA ?Airway suctioned ?NA ?Notes ?Paper version used prior to treatment. ?Electronic Signature(s) ?Signed: 10/06/2021 1:56:43 PM By: [name] CHT EMT BS ?, , ?Entered By: [name] on 10/06/2021 10:52:22 ?

## 2021-10-07 ENCOUNTER — Encounter (HOSPITAL_BASED_OUTPATIENT_CLINIC_OR_DEPARTMENT_OTHER): Payer: BC Managed Care – PPO | Admitting: General Surgery

## 2021-10-07 ENCOUNTER — Other Ambulatory Visit: Payer: Self-pay

## 2021-10-07 DIAGNOSIS — R0981 Nasal congestion: Secondary | ICD-10-CM | POA: Diagnosis not present

## 2021-10-07 DIAGNOSIS — E119 Type 2 diabetes mellitus without complications: Secondary | ICD-10-CM | POA: Diagnosis not present

## 2021-10-07 DIAGNOSIS — Z8543 Personal history of malignant neoplasm of ovary: Secondary | ICD-10-CM | POA: Diagnosis not present

## 2021-10-07 DIAGNOSIS — H6121 Impacted cerumen, right ear: Secondary | ICD-10-CM | POA: Diagnosis not present

## 2021-10-07 DIAGNOSIS — N3041 Irradiation cystitis with hematuria: Secondary | ICD-10-CM | POA: Diagnosis not present

## 2021-10-07 DIAGNOSIS — D709 Neutropenia, unspecified: Secondary | ICD-10-CM | POA: Diagnosis not present

## 2021-10-07 DIAGNOSIS — L59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: Secondary | ICD-10-CM | POA: Diagnosis not present

## 2021-10-07 DIAGNOSIS — C549 Malignant neoplasm of corpus uteri, unspecified: Secondary | ICD-10-CM | POA: Diagnosis not present

## 2021-10-07 DIAGNOSIS — N950 Postmenopausal bleeding: Secondary | ICD-10-CM | POA: Diagnosis not present

## 2021-10-07 DIAGNOSIS — Z853 Personal history of malignant neoplasm of breast: Secondary | ICD-10-CM | POA: Diagnosis not present

## 2021-10-07 DIAGNOSIS — H6983 Other specified disorders of Eustachian tube, bilateral: Secondary | ICD-10-CM | POA: Diagnosis not present

## 2021-10-07 DIAGNOSIS — Y842 Radiological procedure and radiotherapy as the cause of abnormal reaction of the patient, or of later complication, without mention of misadventure at the time of the procedure: Secondary | ICD-10-CM | POA: Diagnosis not present

## 2021-10-07 DIAGNOSIS — C50212 Malignant neoplasm of upper-inner quadrant of left female breast: Secondary | ICD-10-CM | POA: Diagnosis not present

## 2021-10-07 LAB — GLUCOSE, CAPILLARY
Glucose-Capillary: 115 mg/dL — ABNORMAL HIGH (ref 70–99)
Glucose-Capillary: 119 mg/dL — ABNORMAL HIGH (ref 70–99)
Glucose-Capillary: 111 mg/dL — ABNORMAL HIGH (ref 70–99)

## 2021-10-07 NOTE — Progress Notes (Signed)
[name], [name] ([id number]) ?Visit Report for 10/07/2021 ?[name] Details ?Patient Name: Date of Service: ?HO [place], [name] 10/07/2021 1:00 PM ?Medical Record Number: [id number] ?Patient Account Number: [id number] ?Date of Birth/Sex: Treating RN: ?1955-09-27 (66 y.o. 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 ?[name] Treatment Details ?Treatment Number: 3 ?Patient Type: Outpatient ?Chamber Type: Monoplace ?Chamber Serial #: [id number] ?Treatment Protocol: 2.0 ATA with 90 minutes oxygen, with two 5 minute air breaks ?Treatment Details ?Compression Rate Down: 1.0 psi / minute ?De-Compression Rate Up: 1.0 psi / minute ?A breaks and breathing ?ir ?Compress Tx Pressure periods Decompress Decompress ?Begins Reached (leave unused spaces Begins Ends ?blank) ?Chamber Pressure (ATA '1 2 2 2 2 2 '$ --2 1 ?) ?Clock Time (24 hr) 13:21 13:39 14:09 14:15 14:45 14:50 - - 15:19 15:34 ?Treatment Length: 133 (minutes) ?Treatment Segments: 4 ?Vital Signs ?Capillary Blood Glucose Reference Range: 80 - 120 mg / dl ?[name] Diabetic Blood Glucose Intervention Range: <131 mg/dl or >249 mg/dl ?Type: Time Vitals Blood Pulse: Respiratory Temperature: Capillary Blood Glucose Pulse Action ?Taken: Pressure: Rate: Glucose (mg/dl): Meter #: Oximetry (%) Taken: ?Pre 12:35 150/76 61 18 97.5 115 2 ?Pre 13:17 119 2 Protocol: > or = to 110 mg/dL proceed with [name] ?Treatment Response ?Treatment Toleration: Well ?Treatment Completion Status: Treatment Completed without Adverse Event ?Treatment Notes ?Patient's blood glucose level was 115 mg/dL. Patient brought their own equivalent to  Glucerna 8 oz and consumed that. Per protocol, after waiting 30 minutes ?blood glucose had risen to 119 mg/dL, above the required 110 mg/dL. Treatment was started. Patient tolerated compression (1.0 psi/min) of the chamber well. ?Patient visited ENT this morning and was examined. Note scanned into [facility]. ?Patient tolerated decompression of chamber at rate of 1.0 psi/min. ?Electronic Signature(s) ?Signed: 10/07/2021 4:46:58 PM By: [name] CHT EMT BS ?, , ?Signed: 10/07/2021 5:13:33 PM By: [name] MD FACS ?Entered By: [name] on 10/07/2021 16:46:58 ?-------------------------------------------------------------------------------- ?[name] Safety Checklist Details ?Patient Name: ?Date of Service: ?[name], [name] 10/07/2021 1:00 PM ?Medical Record Number: [id number] ?Patient Account Number: [id number] ?Date of Birth/Sex: ?Treating RN: ?[birth year] (67 y.o. [name], [name] ?Primary Care [name]: [name] ?Other Clinician: [name] ?Referring Graycie Halley: ?Treating [name]: [name] ?[name] ?[name]: 4 ?[name] Safety Checklist Items ?Safety Checklist ?Consent Form Signed ?Patient voided / foley secured and emptied ?When did you last eato 1130 ?Last dose of injectable or oral agent Ozempic taken Saturday ?Ostomy pouch emptied and vented if applicable ?All implantable devices assessed, documented and approved ?NA ?Intravenous access site secured and place ?NA ?Valuables secured ?Linens and cotton and cotton/polyester blend (less than 51% polyester) ?Personal oil-based products / skin lotions / body lotions removed ?Wigs or hairpieces removed ?NA ?Smoking or tobacco materials removed ?NA ?Books / newspapers / magazines / loose paper removed ?Cologne, aftershave, perfume and deodorant removed ?Jewelry removed (may wrap wedding band) ?Make-up removed ?Hair care products removed ?Battery operated devices (external) removed ?Heating patches and chemical  warmers removed ?Titanium eyewear removed Plastic frames ?Nail polish cured greater than 10 hours ?NA ?Casting material cured greater than 10 hours ?NA ?Hearing aids removed ?NA ?Loose dentures or partials removed ?NA ?Prosthetics have been removed ?NA ?Patient demonstrates correct use  of air break device (if applicable) ?Patient concerns have been addressed ?Patient grounding bracelet on and cord attached to chamber ?Specifics for Inpatients (complete in addition to above) ?Medication sheet sent with patient ?NA ?Intravenous medications needed or due during therapy sent with patient ?NA ?Drainage tubes (e.g. nasogastric tube or chest tube secured and vented) ?NA ?Endotracheal or Tracheotomy tube secured ?NA ?Cuff deflated of air and inflated with saline ?NA ?Airway suctioned ?NA ?Notes ?Paper version used prior to treatment. ?Electronic Signature(s) ?Signed: 10/07/2021 4:57:38 PM By: [name] CHT EMT BS ?, , ?Entered By: [name] on 10/07/2021 15:14:15 ?

## 2021-10-07 NOTE — Progress Notes (Signed)
SHAKEVIA, [name] ([id number]) ?Visit Report for 10/07/2021 ?SuperBill Details ?Patient Name: Date of Service: ?[name], [name] 10/07/2021 ?Medical Record Number: [id number] ?Patient Account Number: [id number] ?Date of Birth/Sex: Treating RN: ?1956/05/31 (66 y.o. 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, unspecified ?N95.0 Postmenopausal bleeding ?D70.9 Neutropenia, unspecified ?E11.9 Type 2 diabetes mellitus without complications ?Facility Procedures ?CPT4 Code Description Modifier Quantity ?[id number] G0277-(Facility Use Only) HBOT full body chamber, 37mn ?, 4 ?ICD-10 Diagnosis Description ?N30.41 Irradiation cystitis with hematuria ?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 ?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

## 2021-10-07 NOTE — Progress Notes (Signed)
[name], [name] ([id number]) ?Visit Report for 10/07/2021 ?Arrival Information Details ?Patient Name: Date of Service: ?HO [place], [name] 10/07/2021 1:00 PM ?Medical Record Number: [id number] ?Patient Account Number: [id number] ?Date of Birth/Sex: Treating RN: ?02-Dec-1955 (66 y.o. [name], [name] ?Primary Care [name]: [name] ?Other Clinician: Donavan [name] ?Referring [name]: ?Treating [name]/Extender: [name] ?[name] ?[name]: 4 ?Visit Information History Since Last Visit ?All ordered tests and consults were completed: Yes ?Patient Arrived: Ambulatory ?Added or deleted any medications: No ?Arrival Time: 12:32 ?Any new allergies or adverse reactions: No ?Accompanied By: self ?Had a fall or experienced change in No ?Transfer Assistance: None ?activities of daily living that may affect ?Patient Identification Verified: Yes ?risk of falls: ?Secondary Verification Process Completed: Yes ?Signs or symptoms of abuse/neglect since last visito No ?Patient Requires Transmission-Based Precautions: No ?Hospitalized since last visit: No ?Patient Has Alerts: Yes ?Implantable device outside of the clinic excluding No ?Patient Alerts: Patient on Blood Thinner cellular tissue based products placed in the center ?since last visit: ?Pain Present Now: No ?Electronic Signature(s) ?Signed: 10/07/2021 4:57:38 PM By: [name] CHT EMT BS ?, , ?Entered By: [name] on 10/07/2021 15:11:36 ?-------------------------------------------------------------------------------- ?Encounter Discharge Information Details ?Patient Name: Date of Service: ?HO [place], [name] 10/07/2021 1:00 PM ?Medical Record Number: [id number] ?Patient Account Number: [id number] ?Date of Birth/Sex: Treating RN: ?June 05, 1956 (66 y.o. [name], [name] ?Primary Care [name]: [name] ?Other Clinician: Donavan [name] ?Referring [name]: ?Treating [name]/Extender: [name] ?[name] ?[name]: 4 ?Encounter Discharge Information Items ?Discharge Condition: Stable ?Ambulatory Status: Ambulatory ?Discharge Destination: Home ?Transportation: Private Auto ?Accompanied By: self ?Schedule Follow-up Appointment: No ?Clinical Summary of Care: ?Electronic Signature(s) ?Signed: 10/07/2021 4:48:21 PM By: [name] CHT EMT BS ?, , ?Entered By: [name] on 10/07/2021 16:48:21 ?-------------------------------------------------------------------------------- ?Vitals Details ?Patient Name: ?Date of Service: ?[name], [name] 10/07/2021 1:00 PM ?Medical Record Number: [id number] ?Patient Account Number: [id number] ?Date of Birth/Sex: ?Treating RN: ?October 07, 1955 (66 y.o. [name], [name] ?Primary Care [name]: [name] ?Other Clinician: Donavan [name] ?Referring [name]: ?Treating [name]/Extender: [name] ?[name] ?[name]: 4 ?Vital Signs ?Time Taken: 12:35 ?Temperature (??F): 97.5 ?Height (in): 62 ?Pulse (bpm): 61 ?Weight (lbs): 160 ?Respiratory Rate (breaths/min): 18 ?Body Mass Index (BMI): 29.3 ?Blood Pressure (mmHg): 150/76 ?Capillary Blood Glucose (mg/dl): 115 ?Reference Range: 80 - 120 mg / dl ?Electronic Signature(s) ?Signed: 10/07/2021 4:57:38 PM By: [name] CHT EMT BS ?, , ?Entered By: [name] on 10/07/2021 15:12:15 ?

## 2021-10-08 ENCOUNTER — Encounter (INDEPENDENT_AMBULATORY_CARE_PROVIDER_SITE_OTHER): Payer: Self-pay | Admitting: Family Medicine

## 2021-10-08 ENCOUNTER — Ambulatory Visit (INDEPENDENT_AMBULATORY_CARE_PROVIDER_SITE_OTHER): Payer: BC Managed Care – PPO | Admitting: Family Medicine

## 2021-10-08 ENCOUNTER — Encounter (HOSPITAL_BASED_OUTPATIENT_CLINIC_OR_DEPARTMENT_OTHER): Payer: BC Managed Care – PPO | Admitting: General Surgery

## 2021-10-08 ENCOUNTER — Telehealth (INDEPENDENT_AMBULATORY_CARE_PROVIDER_SITE_OTHER): Payer: Self-pay

## 2021-10-08 VITALS — BP 138/79 | HR 65 | Temp 98.1°F | Ht 62.0 in | Wt 218.0 lb

## 2021-10-08 DIAGNOSIS — L59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: Secondary | ICD-10-CM | POA: Diagnosis not present

## 2021-10-08 DIAGNOSIS — N950 Postmenopausal bleeding: Secondary | ICD-10-CM | POA: Diagnosis not present

## 2021-10-08 DIAGNOSIS — E669 Obesity, unspecified: Secondary | ICD-10-CM

## 2021-10-08 DIAGNOSIS — E1169 Type 2 diabetes mellitus with other specified complication: Secondary | ICD-10-CM | POA: Diagnosis not present

## 2021-10-08 DIAGNOSIS — D709 Neutropenia, unspecified: Secondary | ICD-10-CM | POA: Diagnosis not present

## 2021-10-08 DIAGNOSIS — C549 Malignant neoplasm of corpus uteri, unspecified: Secondary | ICD-10-CM | POA: Diagnosis not present

## 2021-10-08 DIAGNOSIS — Y842 Radiological procedure and radiotherapy as the cause of abnormal reaction of the patient, or of later complication, without mention of misadventure at the time of the procedure: Secondary | ICD-10-CM | POA: Diagnosis not present

## 2021-10-08 DIAGNOSIS — C50212 Malignant neoplasm of upper-inner quadrant of left female breast: Secondary | ICD-10-CM | POA: Diagnosis not present

## 2021-10-08 DIAGNOSIS — E119 Type 2 diabetes mellitus without complications: Secondary | ICD-10-CM | POA: Diagnosis not present

## 2021-10-08 DIAGNOSIS — Z853 Personal history of malignant neoplasm of breast: Secondary | ICD-10-CM | POA: Diagnosis not present

## 2021-10-08 DIAGNOSIS — N3041 Irradiation cystitis with hematuria: Secondary | ICD-10-CM | POA: Diagnosis not present

## 2021-10-08 DIAGNOSIS — Z7985 Long-term (current) use of injectable non-insulin antidiabetic drugs: Secondary | ICD-10-CM

## 2021-10-08 DIAGNOSIS — Z6841 Body Mass Index (BMI) 40.0 and over, adult: Secondary | ICD-10-CM

## 2021-10-08 LAB — GLUCOSE, CAPILLARY
Glucose-Capillary: 155 mg/dL — ABNORMAL HIGH (ref 70–99)
Glucose-Capillary: 104 mg/dL — ABNORMAL HIGH (ref 70–99)

## 2021-10-08 NOTE — Telephone Encounter (Signed)
PA initiated via CoverMyMeds.com for Mounjaro 7.'5mg'$ /0.49m once weekly injection. ? ? ?Donita Monceaux (Key: [id number]) ?Mounjaro 7.'5MG'$ /0.5ML pen-injectors ?  ?Form: [employer] DTE Prescription Drug Authorization Form ? ?Wait for Determination ?Please wait for [employer] to return a determination. ?Your information has been submitted to [employer]. [employer] is reviewing the PA request and you will receive an electronic response. You may check for the updated outcome later by reopening this request. The standard fax determination will also be sent to you directly. ? ?If you have any questions about your PA submission, contact [employer] at [phone] ?

## 2021-10-08 NOTE — Progress Notes (Signed)
[name], [name] ([id number]) ?Visit Report for 10/08/2021 ?Problem List Details ?Patient Name: Date of Service: ?HO [place], [name] 10/08/2021 10:30 A M ?Medical Record Number: [id number] ?Patient Account Number: [id number] ?Date of Birth/Sex: Treating RN: ?05-Jan-1956 (66 y.o. 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, unspecified 09/03/2021 No Yes ?N95.0 Postmenopausal bleeding 09/03/2021 No Yes ?D70.9 Neutropenia, unspecified 09/03/2021 No Yes ?E11.9 Type 2 diabetes mellitus without complications 0/09/7739 No Yes ?Inactive Problems ?Resolved Problems ?Electronic Signature(s) ?Signed: 10/08/2021 5:14:50 PM By: [name] MD FACS ?Previous Signature: 10/08/2021 1:44:26 PM Version By: [name] EMT ?Entered By: [name] on 10/08/2021 17:14:50 ?-------------------------------------------------------------------------------- ?SuperBill Details ?Patient Name: Date of Service: ?[name] 10/08/2021 ?Medical Record Number: [id number] ?Patient Account Number: [id number] ?Date of Birth/Sex: ?Treating RN: ?[birth year] (66 y.o. 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, unspecified ?N95.0 Postmenopausal bleeding ?D70.9 Neutropenia, unspecified ?E11.9 Type 2 diabetes mellitus without complications ?Facility Procedures ?CPT4 Code: 09470962 ?Description: G0277-(Facility Use Only) HBOT full body chamber, 32mn , ICD-10 Diagnosis Description 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, unspecified  ?N95.0 Postmenopausal bleeding ?Modifier: ?Quantity: 4 ?Physician Procedures ?: CPT4 Code Description Modifier [id number]- [id number] PHYS HYPERBARIC OXYGEN THERAPY ICD-10 Diagnosis Description 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

## 2021-10-08 NOTE — Progress Notes (Addendum)
[name], [name] ([id number]) ?Visit Report for 10/08/2021 ?[name] ?Patient Name: Date of Service: ?[name] [place], [name] 10/08/2021 10:30 A M ?Medical Record Number: [id number] ?Patient Account Number: [id number] ?Date of Birth/Sex: Treating RN: ?1956-02-03 (66 y.o. 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 ?[name] Treatment [name] ?Treatment Number: 4 ?Patient Type: Outpatient ?Chamber Type: Monoplace ?Chamber Serial #: [id number] ?Treatment Protocol: 2.0 ATA with 90 minutes oxygen, with two 5 minute air breaks ?Treatment [name] ?Compression Rate Down: 1.0 psi / minute ?De-Compression Rate Up: 1.0 psi / minute ?A breaks and breathing ?ir ?Compress Tx Pressure periods Decompress Decompress ?Begins Reached (leave unused spaces Begins Ends ?blank) ?Chamber Pressure (ATA '1 2 2 2 2 2 '$ --2 1 ?) ?Clock Time (24 hr) 10:56 10:10 11:40 11:45 12:15 12:20 - - 12:50 13:07 ?Treatment Length: 131 (minutes) ?Treatment Segments: 4 ?Vital Signs ?Capillary Blood Glucose Reference Range: 80 - 120 mg / dl ?[name] Diabetic Blood Glucose Intervention Range: <131 mg/dl or >249 mg/dl ?Time Vitals Blood Respiratory Capillary Blood Glucose Pulse Action ?Type: ?Pulse: Temperature: ?Taken: ?Pressure: ?Rate: ?Glucose (mg/dl): ?Meter #: Oximetry (%) Taken: ?Pre 10:22 125/64 64 18 97.6 155 ?Post 13:13 133/69 61 16 97.4 104 ?Treatment Response ?Treatment Toleration: Well ?Treatment Completion Status: Treatment Completed without Adverse Event ?Treatment Notes ?Paper version was used prior to the treatment. The patient did well today, she had no problems with her ears today.  The patient's CBG was 104 post-treatment. ?She had her own equivalent to Glucerna 8 oz drink after her treatment, before leaving. ?Physician [name] Attestation: ?I certify that I supervised this [name] treatment in accordance with Medicare ?guidelines. A trained emergency response team is readily available per [name] ?hospital policies and procedures. ?Continue HBOT as ordered. [name] ?Electronic Signature(s) ?[name] ?[name] ?[name] ?[name] ?[name] ?-------------------------------------------------------------------------------- ?[name] ?Patient Name: ?Date of Service: ?[name], [name] 10/08/2021 10:30 A M ?Medical Record Number: [id number] ?Patient Account Number: [id number] ?Date of Birth/Sex: ?Treating RN: ?November 06, 1955 (66 y.o. F) [name] ?Primary Care Akeema Broder: [name] ?Other Clinician: [name] ?Referring Mostafa Yuan: ?Treating Lelani Garnett/Extender: [name] ?[name] ?[name]: 5 ?[name] Safety Checklist Items ?Safety Checklist ?Consent Form Signed ?Patient voided / foley secured and emptied ?When did you last eato 0815 ?Last dose of injectable or oral agent 10/03/2021 ?Ostomy pouch emptied and vented if applicable ?All implantable devices assessed, documented and approved ?Intravenous access site secured and place ?NA ?Valuables secured ?Linens and cotton and cotton/polyester blend (less than 51% polyester) ?Personal oil-based products / skin lotions / body lotions removed ?Wigs or hairpieces removed ?NA ?Smoking or tobacco materials removed ?Books / newspapers / magazines / loose paper removed ?Cologne, aftershave, perfume and deodorant removed ?Jewelry removed (may wrap  wedding band) ?NA ?Make-up removed ?Hair care products removed ?Battery operated devices (external) removed ?Heating patches and chemical warmers removed ?Titanium eyewear removed ?NA ?Nail polish cured greater than 10 hours ?NA ?Casting material cured greater than 10 hours ?NA ?Hearing aids removed ?NA ?Loose  dentures or partials removed ?NA ?Prosthetics have been removed ?NA ?Patient demonstrates correct use of air break device (if applicable) ?Patient concerns have been addressed ?Patient grounding bracelet on and cord attached to chamber ?Specifics for Inpatients (complete in addition to above) ?Medication sheet sent with patient ?NA ?Intravenous medications needed or due during therapy sent with patient ?NA ?Drainage tubes (e.g. nasogastric tube or chest tube secured and vented) ?NA ?Endotracheal or Tracheotomy tube secured ?NA ?Cuff deflated of air and inflated with saline ?NA ?Airway suctioned ?NA ?Notes ?Paper version was used prior to the treatment. The patient was asked if she had any problems with her today before treatment started. She stated no. ?Electronic Signature(s) ?Signed: 10/08/2021 11:32:05 AM By: [name] [name] ?[name] By: [name] on 10/08/2021 11:32:05 ?

## 2021-10-08 NOTE — Progress Notes (Addendum)
[name], [name] ([id number]) ?Visit Report for 10/08/2021 ?Arrival Information Details ?Patient Name: Date of Service: ?[name], [name] [name] B. 10/08/2021 10:30 A M ?Medical Record Number: [id number] ?Patient Account Number: [id number] ?Date of Birth/Sex: Treating RN: ?1956/05/02 (66 y.o. F) [name], [name] ?Primary Care Liany Mumpower: [name] ?Other Clinician: [name] ?Referring Doralyn Kirkes: ?Treating Braxson Hollingsworth/Extender: [name] ?[name] ?[name]: 5 ?Visit Information History Since Last Visit ?All ordered tests and consults were completed: Yes ?Patient Arrived: Ambulatory ?Added or deleted any medications: No ?Arrival Time: 10:15 ?Any new allergies or adverse reactions: No ?Accompanied By: None ?Had a fall or experienced change in No ?Transfer Assistance: None ?activities of daily living that may affect ?Patient Identification Verified: Yes ?risk of falls: ?Secondary Verification Process Completed: Yes ?Signs or symptoms of abuse/neglect since last visito No ?Patient Requires Transmission-Based Precautions: No ?Hospitalized since last visit: No ?Patient Has Alerts: Yes ?Implantable device outside of the clinic excluding No ?Patient Alerts: Patient on Blood Thinner cellular tissue based products placed in the center ?since last visit: ?Pain Present Now: No ?Notes ?Paper version used prior to the treatment ?Electronic Signature(s) ?Signed: 10/08/2021 11:28:22 AM By: [name] EMT ?Entered By: [name] on 10/08/2021 11:28:22 ?-------------------------------------------------------------------------------- ?Encounter Discharge Information Details ?Patient Name: Date of Service: ?[name], [name] [name] B. 10/08/2021 10:30 A M ?Medical Record Number: [id number] ?Patient Account Number: [id number] ?Date of Birth/Sex: Treating RN: ?09-14-1955 (66 y.o. F) [name], [name] ?Primary Care Tennille Montelongo: [name] ?Other Clinician: [name] ?Referring Cleavon Goldman: ?Treating Josclyn Rosales/Extender: [name] ?[name] ?[name]: 5 ?Encounter Discharge Information Items ?Discharge Condition: Stable ?Ambulatory Status: Ambulatory ?Discharge Destination: Home ?Transportation: Private Auto ?Accompanied By: None ?Schedule Follow-up Appointment: Yes ?Clinical Summary of Care: ?Electronic Signature(s) ?Signed: 10/08/2021 1:45:32 PM By: [name] EMT ?Entered By: [name] on 10/08/2021 13:45:32 ?-------------------------------------------------------------------------------- ?Patient/Caregiver Education Details ?Patient Name: ?Date of Service: ?[name], [name] [name] B. 3/9/2023andnbsp10:30 A M ?Medical Record Number: [id number] ?Patient Account Number: [id number] ?Date of Birth/Gender: ?Treating RN: ?1956-05-14 (66 y.o. F) [name], [name] ?Primary Care Physician: [name] ?Other Clinician: [name] ?Referring Physician: ?Treating Physician/Extender: [name] ?[name] ?[name]: 5 ?Education Assessment ?Education Provided To: ?Patient ?Education Topics Provided ?Electronic Signature(s) ?Signed: 10/08/2021 5:59:46 PM By: [name] EMT ?Entered By: [name] on 10/08/2021 13:44:48 ?-------------------------------------------------------------------------------- ?Vitals Details ?Patient Name: ?Date of Service: ?[name], [name] [name] B. 10/08/2021 10:30 A M ?Medical Record Number: [id number] ?Patient Account Number: [id number] ?Date of Birth/Sex: ?Treating RN: ?January 28, 1956 (66 y.o. F) [name], [name] ?Primary Care Briah Nary: [name] ?Other Clinician: [name] ?Referring Jedaiah Rathbun: ?Treating Edilberto Roosevelt/Extender: [name] ?[name] ?[name]: 5 ?Vital Signs ?Time Taken: 10:22 ?Temperature (??F): 97.6 ?Height (in): 62 ?Pulse (bpm): 64 ?Weight (lbs): 160 ?Respiratory Rate (breaths/min): 18 ?Body Mass Index (BMI): 29.3 ?Blood Pressure (mmHg): 125/64 ?Capillary Blood Glucose (mg/dl): 155 ?Reference Range: 80 - 120 mg /  dl ?Electronic Signature(s) ?Signed: 10/08/2021 11:28:55 AM By: [name] EMT ?Entered By: [name] on 10/08/2021 11:28:55 ?

## 2021-10-09 ENCOUNTER — Other Ambulatory Visit: Payer: Self-pay

## 2021-10-09 ENCOUNTER — Encounter (HOSPITAL_BASED_OUTPATIENT_CLINIC_OR_DEPARTMENT_OTHER): Payer: BC Managed Care – PPO | Admitting: General Surgery

## 2021-10-09 DIAGNOSIS — C549 Malignant neoplasm of corpus uteri, unspecified: Secondary | ICD-10-CM | POA: Diagnosis not present

## 2021-10-09 DIAGNOSIS — Z853 Personal history of malignant neoplasm of breast: Secondary | ICD-10-CM | POA: Diagnosis not present

## 2021-10-09 DIAGNOSIS — L59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: Secondary | ICD-10-CM | POA: Diagnosis not present

## 2021-10-09 DIAGNOSIS — N950 Postmenopausal bleeding: Secondary | ICD-10-CM | POA: Diagnosis not present

## 2021-10-09 DIAGNOSIS — E119 Type 2 diabetes mellitus without complications: Secondary | ICD-10-CM | POA: Diagnosis not present

## 2021-10-09 DIAGNOSIS — N3041 Irradiation cystitis with hematuria: Secondary | ICD-10-CM | POA: Diagnosis not present

## 2021-10-09 DIAGNOSIS — C50212 Malignant neoplasm of upper-inner quadrant of left female breast: Secondary | ICD-10-CM | POA: Diagnosis not present

## 2021-10-09 DIAGNOSIS — D709 Neutropenia, unspecified: Secondary | ICD-10-CM | POA: Diagnosis not present

## 2021-10-09 DIAGNOSIS — Y842 Radiological procedure and radiotherapy as the cause of abnormal reaction of the patient, or of later complication, without mention of misadventure at the time of the procedure: Secondary | ICD-10-CM | POA: Diagnosis not present

## 2021-10-09 LAB — GLUCOSE, CAPILLARY
Glucose-Capillary: 174 mg/dL — ABNORMAL HIGH (ref 70–99)
Glucose-Capillary: 105 mg/dL — ABNORMAL HIGH (ref 70–99)

## 2021-10-09 NOTE — Progress Notes (Addendum)
[name], [name] ([id number]) ?Visit Report for 10/09/2021 ?[name] Details ?Patient Name: Date of Service: ?HO [place], [name] 10/09/2021 9:30 A M ?Medical Record Number: [id number] ?Patient Account Number: [id number] ?Date of Birth/Sex: Treating RN: ?11-30-1955 (66 y.o. 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 ?[name] Treatment Details ?Treatment Number: 5 ?Patient Type: Outpatient ?Chamber Type: Monoplace ?Chamber Serial #: [id number] ?Treatment Protocol: 2.0 ATA with 90 minutes oxygen, with two 5 minute air breaks ?Treatment Details ?A breaks and breathing ?ir ?Compress Tx Pressure periods Decompress Decompress ?Begins Reached (leave unused spaces Begins Ends ?blank) ?Chamber Pressure (ATA '1 2 2 2 2 2 '$ --2 1 ?) ?Clock Time (24 hr) 09:54 10:08 10:13 10:43 11:13 11:19 - - 11:49 12:05 ?Treatment Length: 131 (minutes) ?Treatment Segments: 4 ?Vital Signs ?Capillary Blood Glucose Reference Range: 80 - 120 mg / dl ?[name] Diabetic Blood Glucose Intervention Range: <131 mg/dl or >249 mg/dl ?Time Vitals Blood Respiratory Capillary Blood Glucose Pulse Action ?Type: ?Pulse: Temperature: ?Taken: ?Pressure: ?Rate: ?Glucose (mg/dl): ?Meter #: Oximetry (%) Taken: ?Pre 08:59 139/56 79 20 98.3 174 ?Post 12:12 132/62 64 18 98.1 105 ?Treatment Response ?Treatment Toleration: Well ?Treatment Completion Status: Treatment Completed without Adverse Event ?Treatment Notes ?Paper version was used prior to the treatment. The patient did well today, she had no problems with her ears today. The patient's CBG was 105 post-treatment ?The patient stated that she was going  to get something to eat, after leaving treatment today. ?Physician [name] Attestation: ?I certify that I supervised this [name] treatment in accordance with Medicare ?guidelines. A trained emergency response team is readily available per Yes ?hospital policies and procedures. ?Continue HBOT as ordered. Yes ?Electronic Signature(s) ?Signed: 10/12/2021 7:33:42 AM By: [name] Maudlin MD FACS ?Previous Signature: 10/09/2021 12:40:48 PM Version By: [name] EMT ?Entered By: [name] on 10/12/2021 07:33:41 ?-------------------------------------------------------------------------------- ?[name] Safety Checklist Details ?Patient Name: ?Date of Service: ?[name], [name] 10/09/2021 9:30 A M ?Medical Record Number: [id number] ?Patient Account Number: [id number] ?Date of Birth/Sex: ?Treating RN: ?Dec 30, 1955 (66 y.o. [name], [name] ?Primary Care Krue Peterka: [name] ?Other Clinician: [name] ?Referring Abdinasir Spadafore: ?Treating Jackqueline Aquilar/Extender: [name] ?[name] ?[name]: 5 ?[name] Safety Checklist Items ?Safety Checklist ?Consent Form Signed ?Patient voided / foley secured and emptied ?When did you last eato 0815 ?Last dose of injectable or oral agent 10/03/2021 ?Ostomy pouch emptied and vented if applicable ?All implantable devices assessed, documented and approved ?NA ?Intravenous access site secured and place ?NA ?Valuables secured ?Linens and cotton and cotton/polyester blend (less than 51% polyester) ?Personal oil-based products / skin lotions / body lotions removed ?Wigs or hairpieces removed ?NA ?Smoking or tobacco materials removed ?Books / newspapers / magazines / loose paper removed ?Cologne, aftershave, perfume and deodorant removed ?Jewelry removed (may wrap wedding band) ?Make-up removed ?Hair care products removed ?Battery operated devices (external) removed ?Heating patches and chemical warmers removed ?Titanium eyewear removed ?NA ?Nail polish cured greater than 10  hours ?NA ?Casting material cured greater than 10 hours ?NA ?Hearing aids removed ?NA ?Loose dentures or partials removed ?NA ?Prosthetics have been removed ?NA ?Patient demonstrates correct use of air break device (if applicable) ?Patient concerns have been addressed ?Patient grounding bracelet on and cord attached to  chamber ?Specifics for Inpatients (complete in addition to above) ?Medication sheet sent with patient ?NA ?Intravenous medications needed or due during therapy sent with patient ?NA ?Drainage tubes (e.g. nasogastric tube or chest tube secured and vented) ?NA ?Endotracheal or Tracheotomy tube secured ?NA ?Cuff deflated of air and inflated with saline ?NA ?Airway suctioned ?NA ?Notes ?Paper version was used prior to the treatment. The patient was asked if she had any problems with her today before treatment started. She stated no. ?Electronic Signature(s) ?Signed: 10/09/2021 10:32:22 AM By: [name] EMT ?Entered By: [name] on 10/09/2021 10:32:21 ?

## 2021-10-09 NOTE — Progress Notes (Addendum)
[name], [name] ([id number]) ?Visit Report for 10/09/2021 ?Arrival Information Details ?Patient Name: Date of Service: ?[name], [name] 10/09/2021 9:30 A M ?Medical Record Number: [id number] ?Patient Account Number: [id number] ?Date of Birth/Sex: Treating RN: ?November 08, 1955 (66 y.o. F) ?Primary Care Dewaine Morocho: [name] Other Clinician: ?Referring Romilda Proby: ?Treating Jeffey Janssen/Extender: [name] ?[name] ?[name]: 5 ?Visit Information History Since Last Visit ?All ordered tests and consults were completed: Yes ?Patient Arrived: Ambulatory ?Added or deleted any medications: No ?Arrival Time: 08:53 ?Any new allergies or adverse reactions: No ?Accompanied By: None ?Had a fall or experienced change in No ?Transfer Assistance: None ?activities of daily living that may affect ?Patient Identification Verified: Yes ?risk of falls: ?Secondary Verification Process Completed: Yes ?Signs or symptoms of abuse/neglect since last visito No ?Patient Requires Transmission-Based Precautions: No ?Hospitalized since last visit: No ?Patient Has Alerts: Yes ?Implantable device outside of the clinic excluding No ?Patient Alerts: Patient on Blood Thinner cellular tissue based products placed in the center ?since last visit: ?Pain Present Now: No ?Notes ?Paper version used prior to the treatment ?Electronic Signature(s) ?Signed: 10/09/2021 10:28:35 AM By: [name] EMT ?Entered By: [name] on 10/09/2021 10:28:35 ?-------------------------------------------------------------------------------- ?Encounter Discharge Information Details ?Patient Name: Date of Service: ?[name], [name] 10/09/2021 9:30 A M ?Medical Record Number: [id number] ?Patient Account Number: [id number] ?Date of Birth/Sex: Treating RN: ?Nov 13, 1955 (66 y.o. [name], [name] ?Primary Care Jeanni Allshouse: [name] ?Other Clinician: [name] ?Referring Leanny Moeckel: ?Treating Bowen Kia/Extender: [name] ?[name] ?[name]: 5 ?Encounter Discharge Information Items ?Discharge Condition: Stable ?Ambulatory Status: Ambulatory ?Discharge Destination: Home ?Transportation: Private Auto ?Accompanied By: None ?Schedule Follow-up Appointment: Yes ?Clinical Summary of Care: ?Electronic Signature(s) ?Signed: 10/09/2021 12:42:26 PM By: [name] EMT ?Entered By: [name] on 10/09/2021 12:42:26 ?-------------------------------------------------------------------------------- ?Patient/Caregiver Education Details ?Patient Name: ?Date of Service: ?[name], [name] 3/10/2023andnbsp9:30 A M ?Medical Record Number: [id number] ?Patient Account Number: [id number] ?Date of Birth/Gender: ?Treating RN: ?09/06/1955 (66 y.o. [name], [name] ?Primary Care Physician: [name] ?Other Clinician: [name] ?Referring Physician: ?Treating Physician/Extender: [name] ?[name] ?[name]: 5 ?Education Assessment ?Education Provided To: ?Patient ?Education Topics Provided ?Electronic Signature(s) ?Signed: 10/09/2021 12:42:56 PM By: [name] EMT ?Entered By: [name] on 10/09/2021 12:42:08 ?-------------------------------------------------------------------------------- ?Vitals Details ?Patient Name: ?Date of Service: ?[name], [name] 10/09/2021 9:30 A M ?Medical Record Number: [id number] ?Patient Account Number: [id number] ?Date of Birth/Sex: ?Treating RN: ?[birth year] (66 y.o. [name], [name] ?Primary Care [place] Gopal: [name] ?Other Clinician: [name] ?Referring Aniketh Huberty: ?Treating Porter Moes/Extender: [name] ?[name] ?[name]: 5 ?Vital Signs ?Time Taken: 08:59 ?Temperature (??F): 98.3 ?Height (in): 62 ?Pulse (bpm): 79 ?Weight (lbs): 160 ?Respiratory Rate (breaths/min): 20 ?Body Mass Index (BMI): 29.3 ?Blood Pressure (mmHg): 139/56 ?Capillary Blood Glucose (mg/dl): 174 ?Reference Range: 80 - 120 mg / dl ?Notes ?Paper version  used prior to the treatment ?Electronic Signature(s) ?Signed: 10/09/2021 10:29:17 AM By: [name] EMT ?Entered By: [name] on 10/09/2021 10:29:17 ?

## 2021-10-12 ENCOUNTER — Encounter (INDEPENDENT_AMBULATORY_CARE_PROVIDER_SITE_OTHER): Payer: Self-pay | Admitting: Family Medicine

## 2021-10-12 ENCOUNTER — Other Ambulatory Visit: Payer: Self-pay

## 2021-10-12 ENCOUNTER — Encounter (HOSPITAL_BASED_OUTPATIENT_CLINIC_OR_DEPARTMENT_OTHER): Payer: BC Managed Care – PPO | Admitting: General Surgery

## 2021-10-12 DIAGNOSIS — Y842 Radiological procedure and radiotherapy as the cause of abnormal reaction of the patient, or of later complication, without mention of misadventure at the time of the procedure: Secondary | ICD-10-CM | POA: Diagnosis not present

## 2021-10-12 DIAGNOSIS — C50212 Malignant neoplasm of upper-inner quadrant of left female breast: Secondary | ICD-10-CM | POA: Diagnosis not present

## 2021-10-12 DIAGNOSIS — D709 Neutropenia, unspecified: Secondary | ICD-10-CM | POA: Diagnosis not present

## 2021-10-12 DIAGNOSIS — C549 Malignant neoplasm of corpus uteri, unspecified: Secondary | ICD-10-CM | POA: Diagnosis not present

## 2021-10-12 DIAGNOSIS — L59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: Secondary | ICD-10-CM | POA: Diagnosis not present

## 2021-10-12 DIAGNOSIS — N3041 Irradiation cystitis with hematuria: Secondary | ICD-10-CM | POA: Diagnosis not present

## 2021-10-12 DIAGNOSIS — E119 Type 2 diabetes mellitus without complications: Secondary | ICD-10-CM | POA: Diagnosis not present

## 2021-10-12 DIAGNOSIS — Z853 Personal history of malignant neoplasm of breast: Secondary | ICD-10-CM | POA: Diagnosis not present

## 2021-10-12 DIAGNOSIS — N950 Postmenopausal bleeding: Secondary | ICD-10-CM | POA: Diagnosis not present

## 2021-10-12 DIAGNOSIS — E1122 Type 2 diabetes mellitus with diabetic chronic kidney disease: Secondary | ICD-10-CM

## 2021-10-12 LAB — GLUCOSE, CAPILLARY
Glucose-Capillary: 166 mg/dL — ABNORMAL HIGH (ref 70–99)
Glucose-Capillary: 159 mg/dL — ABNORMAL HIGH (ref 70–99)

## 2021-10-12 MED ORDER — SEMAGLUTIDE (2 MG/DOSE) 8 MG/3ML ~~LOC~~ SOPN: 2.0000 mg | PEN_INJECTOR | SUBCUTANEOUS | 0 refills | Status: DC

## 2021-10-12 MED ORDER — TIRZEPATIDE 7.5 MG/0.5ML ~~LOC~~ SOAJ: 7.5000 mg | SUBCUTANEOUS | 0 refills | Status: DC

## 2021-10-12 NOTE — Progress Notes (Addendum)
[name], [name] ([id number]) ?Visit Report for 10/12/2021 ?[name] Details ?Patient Name: Date of Service: ?[name], [name] 10/12/2021 9:00 A M ?Medical Record Number: [id number] ?Patient Account Number: [id number] ?Date of Birth/Sex: Treating RN: ?1955/12/07 (66 y.o. 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 ?[name] Treatment Details ?Treatment Number: 6 ?Patient Type: Outpatient ?Chamber Type: Monoplace ?Chamber Serial #: [id number] ?Treatment Protocol: 2.0 ATA with 90 minutes oxygen, with two 5 minute air breaks ?Treatment Details ?Compression Rate Down: 1.0 psi / minute De-Compression Rate Up: ?A breaks ?ir ?and ?breathing ?Compress Tx Pressure periods Decompress Decompress ?Begins Reached (leave Begins Ends ?unused ?spaces ?blank) ?Chamber Pressure (ATA 1 2 2222 -- 2 1 ?) ?Clock Time (24 hr) 09:13 - - - - - - - 09:38 10:14 ?Treatment Length: 61 (minutes) ?Treatment Segments: 2 ?Vital Signs ?Capillary Blood Glucose Reference Range: 80 - 120 mg / dl ?[name] Diabetic Blood Glucose Intervention Range: <131 mg/dl or >249 mg/dl ?Time Vitals Blood Respiratory Capillary Blood Glucose Pulse Action ?Type: ?Pulse: Temperature: ?Taken: ?Pressure: ?Rate: ?Glucose (mg/dl): ?Meter #: Oximetry (%) Taken: ?Pre 08:55 124/68 64 14 97.6 166 ?Post 09:58 121/78 58 16 97.7 159 ?Treatment Response ?Treatment Toleration: Fair ?Adverse Events: ?1:Barotrauma - Ear ?Treatment Completion Status: Treatment Aborted/Not Restarted ?Reason: Adverse Event ?Treatment Notes ?Paper version used prior to the treatment. Travel rate @ 1.0 PSI/min @ 0919 the patient complained of  right ear pain at 5 PSI, the pressure was dropped to 4 ?PSI. She stated that she had cleared her ears @ 0920. Travel restarted @ 1.0 PSI/min, she once complained of right ear pain @ 0922 at 5 PSI. Pressure ?dropped to 4 PSI. Pressure held @ 4PSI. At [phone] She was able to clear her ear. Compression restarted travel rate @ 1.0PSI/min @ 0933 at 6.5 PSI, she again ?complain of ear pain. The pressure dropped to 4.5 PSI. she stated that her ear was clear. I called Dr. [name] to the chamber and informed her of what was ?going on with the patient. Dr. [name] aborted treatment. Decompression started @ 0938, and the door opened at 1014. Dr. [name] came back and looked at ?the patient ears. Dr. [name] requested that the patient see ENT before any more treatments. [name] called. An appointment was made ?, ?for the patient for today at 2:30pm. I called the patient and informed her of the appointment. ?[name] Notes ?Contacted by [name] tech regarding difficulty taking patient to depth. He had made multiple adjustments to try and help her clear her ears. Every time he would ?decrease the pressure, she would be able to clear her ears but then as he continued to increased pressure, she would have another issue. After coming to the ?chamber side, I confirmed with the patient that she has been taking Claritin, but neither Flonase nor Afrin, which had been suggested when she last saw ENT. ?After she surfaced and was removed from the chamber, I examined both of her ears with otoscope. Evidence of grade 1 barotrauma bilaterally, right greater ?than left. We will have her see ENT prior to proceeding with any further hyperbaric oxygen therapy. ?Physician [name] Attestation: ?I certify that I supervised this [name] treatment in accordance with Medicare ?guidelines. A trained emergency  response team is readily available per Yes ?hospital policies and procedures. ?Continue HBOT as ordered. No ?Electronic Signature(s) ?Signed:  10/12/2021 5:07:12 PM By: [name] Maudlin MD FACS ?Previous Signature: 10/12/2021 11:29:37 AM Version By: [name] EMT ?Previous Signature: 10/12/2021 10:57:10 AM Version By: [name] Maudlin MD FACS ?Entered By: [name] on 10/12/2021 17:07:11 ?-------------------------------------------------------------------------------- ?[name] Safety Checklist Details ?Patient Name: ?Date of Service: ?[name], [name] 10/12/2021 9:00 A M ?Medical Record Number: [id number] ?Patient Account Number: [id number] ?Date of Birth/Sex: ?Treating RN: ?08/24/1955 (66 y.o. [name], [name] ?Primary Care [name]: [name] ?Other Clinician: [name] ?Referring [name]: ?Treating [name]/Extender: [name] ?[name] ?[name]: 5 ?[name] Safety Checklist Items ?Safety Checklist ?Consent Form Signed ?Patient voided / foley secured and emptied ?When did you last eato 0815 ?Last dose of injectable or oral agent Saturday 10-10-2021 ?Ostomy pouch emptied and vented if applicable ?All implantable devices assessed, documented and approved ?Intravenous access site secured and place ?NA ?Valuables secured ?Linens and cotton and cotton/polyester blend (less than 51% polyester) ?Personal oil-based products / skin lotions / body lotions removed ?Wigs or hairpieces removed ?NA ?Smoking or tobacco materials removed ?Books / newspapers / magazines / loose paper removed ?Cologne, aftershave, perfume and deodorant removed ?Jewelry removed (may wrap wedding band) ?NA ?Make-up removed ?Hair care products removed ?Battery operated devices (external) removed ?Heating patches and chemical warmers removed ?Titanium eyewear removed ?NA ?Nail polish cured greater than 10 hours ?NA ?Casting material cured greater than 10 hours ?NA ?Hearing aids removed ?NA ?Loose dentures or partials removed ?NA ?Prosthetics have been removed ?NA ?Patient demonstrates correct use of air break device (if applicable) ?Patient concerns  have been addressed ?Patient grounding bracelet on and cord attached to chamber ?Specifics for Inpatients (complete in addition to above) ?Medication sheet sent with patient ?NA ?Intravenous medications needed or due during therapy sent with patient ?NA ?Drainage tubes (e.g. nasogastric tube or chest tube secured and vented) ?NA ?Endotracheal or Tracheotomy tube secured ?NA ?Cuff deflated of air and inflated with saline ?NA ?Airway suctioned ?NA ?Electronic Signature(s) ?Signed: 10/12/2021 10:51:07 AM By: [name] EMT ?Entered By: [name] on 10/12/2021 10:51:06 ?

## 2021-10-12 NOTE — Progress Notes (Signed)
[name], [name] ([id number]) ?Visit Report for 10/12/2021 ?Problem List Details ?Patient Name: Date of Service: ?[name], [name] 10/12/2021 9:00 A M ?Medical Record Number: [id number] ?Patient Account Number: [id number] ?Date of Birth/Sex: Treating RN: ?May 17, 1956 (66 y.o. 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, unspecified 09/03/2021 No Yes ?N95.0 Postmenopausal bleeding 09/03/2021 No Yes ?D70.9 Neutropenia, unspecified 09/03/2021 No Yes ?E11.9 Type 2 diabetes mellitus without complications 09/08/9483 No Yes ?Inactive Problems ?Resolved Problems ?Electronic Signature(s) ?Signed: 10/12/2021 5:07:45 PM By: [name] MD FACS ?Previous Signature: 10/12/2021 11:32:20 AM Version By: [name] EMT ?Entered By: [name] on 10/12/2021 17:07:44 ?-------------------------------------------------------------------------------- ?SuperBill Details ?Patient Name: Date of Service: ?[name] [name], [name] [name] 10/12/2021 ?Medical Record Number: [id number] ?Patient Account Number: [id number] ?Date of Birth/Sex: ?Treating RN: ?[birth year] (66 y.o. 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, unspecified ?N95.0 Postmenopausal bleeding ?D70.9 Neutropenia, unspecified ?E11.9 Type 2 diabetes mellitus without complications ?Facility Procedures ?CPT4 Code: 93818299 ?Description: G0277-(Facility Use Only) HBOT full body chamber, 90mn , ICD-10 Diagnosis Description 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

## 2021-10-12 NOTE — Progress Notes (Signed)
[name], [name] ([id number]) ?Visit Report for 10/09/2021 ?Problem List Details ?Patient Name: Date of Service: ?HO [place], [name] 10/09/2021 9:30 A M ?Medical Record Number: [id number] ?Patient Account Number: [id number] ?Date of Birth/Sex: Treating RN: ?Apr 08, 1956 (66 y.o. 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 10/09/2021 ?Medical Record Number: [id number] ?Patient Account Number: [id number] ?Date of Birth/Sex: ?Treating RN: ?08/31/1955 (66 y.o. 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, unspecified ?N95.0 Postmenopausal bleeding ?D70.9 Neutropenia, unspecified ?E11.9 Type 2 diabetes mellitus without complications ?Facility Procedures ?CPT4 Code: 39767341 ?Description: G0277-(Facility Use Only) HBOT full body chamber, 17mn , ICD-10 Diagnosis Description 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, unspecified  ?N95.0 Postmenopausal bleeding ?Modifier: ?Quantity: 4 ?Physician Procedures ?: CPT4 Code Description Modifier [id number]- WC PHYS HYPERBARIC OXYGEN THERAPY ICD-10 Diagnosis Description 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

## 2021-10-12 NOTE — Progress Notes (Signed)
? ? ? ?Chief Complaint:  ? ?OBESITY ?[name] is here to discuss her progress with her obesity treatment plan along with follow-up of her obesity related diagnoses. [name] is on keeping a food journal and adhering to recommended goals of 1200-1300 calories and 70+ grams of protein daily and states she is following her eating plan approximately 50% of the time. [name] states she is doing 0 minutes 0 times per week. ? ?Today's visit was #: 56 ?Starting weight: 208 lbs ?Starting date: 04/05/2018 ?Today's weight: 218 lbs ?Today's date: 10/08/2021 ?Total lbs lost to date: 0 ?Total lbs lost since last in-office visit: 0 ? ?Interim History: [name] has done well with maintaining her weight. She is doing daily hyperbaric treatments and she has had to deviate from her plan, but she is still mindful of her her food choices. ? ?Subjective:  ? ?1. Type 2 diabetes mellitus with other specified complication, without long-term current use of insulin ([place]) ?[name] was prescribed Mounjaro, but the pharmacy did not give it to her with no reason noted in the chart.  ? ?Assessment/Plan:  ? ?1. Type 2 diabetes mellitus with other specified complication, without long-term current use of insulin ([place]) ?[name] agreed to increase Ozempic to 2 mg (she is to do 54 clicks or 1.5 mg once weekly). Prior authorization will be sent if [name] doesn't go through. ? ?2. Obesity with current BMI of 40.0 ?[name] is currently in the action stage of change. As such, her goal is to continue with weight loss efforts. She has agreed to keeping a food journal and adhering to recommended goals of 1200-1300 calories and 75+ grams of protein daily.  ? ?Behavioral modification strategies: increasing lean protein intake. ? ?[name] has agreed to follow-up with our clinic in 3 to 4 weeks. She was informed of the importance of frequent follow-up visits to maximize her success with intensive lifestyle modifications for her multiple health conditions.  ? ?Objective:  ? ?Blood  pressure 138/79, pulse 65, temperature 98.1 ?F (36.7 ?C), height '5\' 2"'$  (1.575 m), weight 218 lb (98.9 kg), SpO2 98 %. ?Body mass index is 39.87 kg/m?. ? ?General: Cooperative, alert, well developed, in no acute distress. ?HEENT: Conjunctivae and lids unremarkable. ?Cardiovascular: Regular rhythm.  ?Lungs: Normal work of breathing. ?Neurologic: No focal deficits.  ? ?Lab Results  ?Component Value Date  ? CREATININE 1.60 (H) 08/19/2021  ? BUN 30 (H) 08/19/2021  ? NA 140 08/19/2021  ? K 3.9 08/19/2021  ? CL 106 08/19/2021  ? CO2 25 07/23/2021  ? ?Lab Results  ?Component Value Date  ? ALT 20 07/23/2021  ? AST 17 07/23/2021  ? ALKPHOS 112 07/23/2021  ? BILITOT 0.4 07/23/2021  ? ?Lab Results  ?Component Value Date  ? HGBA1C 6.0 07/23/2021  ? HGBA1C 6.3 (H) 06/16/2020  ? HGBA1C 7.9 (H) 09/01/2017  ? HGBA1C 5.3 04/01/2017  ? HGBA1C 5.8 (H) 09/05/2015  ? ?Lab Results  ?Component Value Date  ? INSULIN 14.1 06/16/2020  ? ?Lab Results  ?Component Value Date  ? TSH 1.83 07/23/2021  ? ?Lab Results  ?Component Value Date  ? CHOL 73 07/23/2021  ? HDL 43.50 07/23/2021  ? [place] 12 07/23/2021  ? TRIG 89.0 07/23/2021  ? CHOLHDL 2 07/23/2021  ? ?Lab Results  ?Component Value Date  ? VD25OH 25.9 (L) 06/16/2020  ? VD25OH 40.8 04/05/2018  ? ?Lab Results  ?Component Value Date  ? WBC 4.3 10/01/2021  ? HGB 12.6 10/01/2021  ? HCT 39.2 10/01/2021  ? MCV  95.1 10/01/2021  ? PLT 210 10/01/2021  ? ?Lab Results  ?Component Value Date  ? IRON 7 (L) 08/31/2017  ? TIBC 164 (L) 08/31/2017  ? FERRITIN 27 11/29/2014  ? ?Attestation Statements:  ? ?Reviewed by clinician on day of visit: allergies, medications, problem list, medical history, surgical history, family history, social history, and previous encounter notes. ? ?Time spent on visit including pre-visit chart review and post-visit care and charting was 32 minutes.  ? ? ?I, Trixie Dredge, am acting as transcriptionist for Dennard Nip, MD. ? ?I have reviewed the above documentation for accuracy  and completeness, and I agree with the above. -  Dennard Nip, MD ? ? ?

## 2021-10-12 NOTE — Progress Notes (Addendum)
[name], [name] ([id number]) Visit Report for 10/12/2021 Arrival Information Details Patient Name: Date of Service: [name], [name]. 10/12/2021 9:00 A M Medical Record Number: [id number] Patient Account Number: [id number] Date of Birth/Sex: Treating RN: 04-18-1956 (66 y.o. [name] Primary Care Sheron Tallman: [name] Other Clinician: [name] Referring Neisha Hinger: Treating Shanzay Hepworth/Extender: Maxwell Marion in Treatment: 5 Visit Information History Since Last Visit All ordered tests and consults were completed: Yes Patient Arrived: Ambulatory Added or deleted any medications: No Arrival Time: 08:38 Any new allergies or adverse reactions: No Accompanied By: None Had a fall or experienced change in No Transfer Assistance: None activities of daily living that may affect Patient Identification Verified: Yes risk of falls: Secondary Verification Process Completed: Yes Signs or symptoms of abuse/neglect since last visito No Patient Requires Transmission-Based Precautions: No Hospitalized since last visit: No Patient Has Alerts: Yes Implantable device outside of the clinic excluding No Patient Alerts: Patient on Blood Thinner cellular tissue based products placed in the center since last visit: Pain Present Now: No Notes Paper version used prior to the treatment. The patient was asked if she had any ear problems today. she stated no. Electronic Signature(s) Signed: 10/12/2021 10:45:30 AM By: [name] EMT Entered By: [name] on 10/12/2021 10:45:29 -------------------------------------------------------------------------------- Vitals Details Patient Name: Date of Service: [name] [name], PA [name]. 10/12/2021 9:00 A M Medical Record Number: [id number] Patient Account Number: [id number] Date of Birth/Sex: Treating RN: [birth year] (66 y.o. [name] Primary Care Stormey Wilborn: [name] Other Clinician: [name] Referring Caidynce Muzyka: Treating Veena Sturgess/Extender: Maxwell Marion in Treatment: 5 Vital Signs Time Taken: 08:55 Temperature (F): 97.6 Height (in): 62 Pulse (bpm): 64 Weight (lbs): 160 Respiratory Rate (breaths/min): 14 Body Mass Index (BMI): 29.3 Blood Pressure (mmHg): 124/68 Capillary Blood Glucose (mg/dl): 166 Reference Range: 80 - 120 mg / dl Notes Paper version used prior to the treatment Electronic Signature(s) Signed: 10/12/2021 10:49:04 AM By: [name] EMT Entered By: [name] on 10/12/2021 10:49:02

## 2021-10-13 ENCOUNTER — Encounter (HOSPITAL_BASED_OUTPATIENT_CLINIC_OR_DEPARTMENT_OTHER): Payer: BC Managed Care – PPO | Admitting: General Surgery

## 2021-10-13 DIAGNOSIS — H6983 Other specified disorders of Eustachian tube, bilateral: Secondary | ICD-10-CM | POA: Diagnosis not present

## 2021-10-14 ENCOUNTER — Other Ambulatory Visit: Payer: Self-pay

## 2021-10-14 ENCOUNTER — Encounter (HOSPITAL_BASED_OUTPATIENT_CLINIC_OR_DEPARTMENT_OTHER): Payer: BC Managed Care – PPO | Admitting: General Surgery

## 2021-10-14 DIAGNOSIS — D709 Neutropenia, unspecified: Secondary | ICD-10-CM | POA: Diagnosis not present

## 2021-10-14 DIAGNOSIS — Z853 Personal history of malignant neoplasm of breast: Secondary | ICD-10-CM | POA: Diagnosis not present

## 2021-10-14 DIAGNOSIS — C50212 Malignant neoplasm of upper-inner quadrant of left female breast: Secondary | ICD-10-CM | POA: Diagnosis not present

## 2021-10-14 DIAGNOSIS — C549 Malignant neoplasm of corpus uteri, unspecified: Secondary | ICD-10-CM | POA: Diagnosis not present

## 2021-10-14 DIAGNOSIS — L59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: Secondary | ICD-10-CM | POA: Diagnosis not present

## 2021-10-14 DIAGNOSIS — N950 Postmenopausal bleeding: Secondary | ICD-10-CM | POA: Diagnosis not present

## 2021-10-14 DIAGNOSIS — N3041 Irradiation cystitis with hematuria: Secondary | ICD-10-CM | POA: Diagnosis not present

## 2021-10-14 DIAGNOSIS — E119 Type 2 diabetes mellitus without complications: Secondary | ICD-10-CM | POA: Diagnosis not present

## 2021-10-14 DIAGNOSIS — Y842 Radiological procedure and radiotherapy as the cause of abnormal reaction of the patient, or of later complication, without mention of misadventure at the time of the procedure: Secondary | ICD-10-CM | POA: Diagnosis not present

## 2021-10-14 LAB — GLUCOSE, CAPILLARY
Glucose-Capillary: 153 mg/dL — ABNORMAL HIGH (ref 70–99)
Glucose-Capillary: 139 mg/dL — ABNORMAL HIGH (ref 70–99)

## 2021-10-14 NOTE — Progress Notes (Addendum)
[name], [name] ([id number]) ?Visit Report for 10/14/2021 ?Arrival Information Details ?Patient Name: Date of Service: ?[name], [name] 10/14/2021 9:00 A M ?Medical Record Number: [id number] ?Patient Account Number: [id number] ?Date of Birth/Sex: Treating RN: ?Nov 24, 1955 (66 y.o. F) [name] ?Primary Care Dayten Juba: [name] ?Other Clinician: [name] ?Referring Sundiata Ferrick: ?Treating Cedric Mcclaine/Extender: [name] ?[name] ?[name]: 5 ?Visit Information History Since Last Visit ?All ordered tests and consults were completed: Yes ?Patient Arrived: Ambulatory ?Added or deleted any medications: No ?Arrival Time: 08:50 ?Any new allergies or adverse reactions: No ?Accompanied By: self ?Had a fall or experienced change in No ?Transfer Assistance: None ?activities of daily living that may affect ?Patient Identification Verified: Yes ?risk of falls: ?Secondary Verification Process Completed: Yes ?Signs or symptoms of abuse/neglect since last visito No ?Patient Requires Transmission-Based Precautions: No ?Hospitalized since last visit: No ?Patient Has Alerts: Yes ?Implantable device outside of the clinic excluding No ?Patient Alerts: Patient on Blood Thinner cellular tissue based products placed in the center ?since last visit: ?Pain Present Now: No ?Electronic Signature(s) ?Signed: 10/14/2021 10:58:41 AM By: [name] CHT EMT BS ?, , ?Entered By: [name] on 10/14/2021 10:58:41 ?-------------------------------------------------------------------------------- ?Encounter Discharge Information Details ?Patient Name: Date of Service: ?[name], [name] 10/14/2021 9:00 A M ?Medical Record Number: [id number] ?Patient Account Number: [id number] ?Date of Birth/Sex: Treating RN: ?Mar 19, 1956 (66 y.o. F) [name] ?Primary Care Dontae Minerva: [name] ?Other Clinician: [name] ?Referring Maanvi Lecompte: ?Treating Orion Mole/Extender: [name] ?[name] ?[name]: 5 ?Encounter Discharge Information Items ?Discharge Condition: Stable ?Ambulatory Status: Ambulatory ?Discharge Destination: Home ?Transportation: Private Auto ?Accompanied By: self ?Schedule Follow-up Appointment: No ?Clinical Summary of Care: ?Electronic Signature(s) ?Signed: 10/14/2021 1:08:56 PM By: [name] CHT EMT BS ?, , ?Entered By: [name] on 10/14/2021 12:22:42 ?-------------------------------------------------------------------------------- ?Vitals Details ?Patient Name: ?Date of Service: ?[name], [name] 10/14/2021 9:00 A M ?Medical Record Number: [id number] ?Patient Account Number: [id number] ?Date of Birth/Sex: ?Treating RN: ?January 05, 1956 (66 y.o. F) [name] ?Primary Care Jibri Schriefer: [name] ?Other Clinician: [name] ?Referring Rileigh Kawashima: ?Treating Nasser Ku/Extender: [name] ?[name] ?[name]: 5 ?Vital Signs ?Time Taken: 09:05 ?Temperature (??F): 97.9 ?Height (in): 62 ?Pulse (bpm): 68 ?Weight (lbs): 160 ?Respiratory Rate (breaths/min): 16 ?Body Mass Index (BMI): 29.3 ?Blood Pressure (mmHg): 140/55 ?Capillary Blood Glucose (mg/dl): 153 ?Reference Range: 80 - 120 mg / dl ?Electronic Signature(s) ?Signed: 10/14/2021 10:59:16 AM By: [name] CHT EMT BS ?, , ?Entered By: [name] on 10/14/2021 10:59:16 ?

## 2021-10-14 NOTE — Progress Notes (Addendum)
[name], [name] ([id number]) ?Visit Report for 10/14/2021 ?Problem List Details ?Patient Name: Date of Service: ?[name], [name] 10/14/2021 9:00 A M ?Medical Record Number: [id number] ?Patient Account Number: [id number] ?Date of Birth/Sex: Treating RN: ?02/28/1956 (66 y.o. 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 10/14/2021 ?Medical Record Number: [id number] ?Patient Account Number: [id number] ?Date of Birth/Sex: ?Treating RN: ?[birth year] (66 y.o. 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 ?Facility Procedures ?CPT4 Code: 97026378 ?Description: G0277-(Facility Use Only) HBOT full body chamber, 58mn , ICD-10 Diagnosis Description 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 C54.9  ?Malignant neoplasm of corpus uteri, 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 ?Modifier: n ?Quantity: 4 ?Physician Procedures ?: CPT4 Code Description Modifier [id number]- [id number] PHYS HYPERBARIC OXYGEN THERAPY ICD-10 Diagnosis Description 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

## 2021-10-14 NOTE — Progress Notes (Addendum)
[name], [name] ([id number]) ?Visit Report for 10/14/2021 ?[name] Details ?Patient Name: Date of Service: ?[name], [name] 10/14/2021 9:00 A M ?Medical Record Number: [id number] ?Patient Account Number: [id number] ?Date of Birth/Sex: Treating RN: ?04-23-1956 (66 y.o. 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 ?[name] Treatment Details ?Treatment Number: 7 ?Patient Type: Outpatient ?Chamber Type: Monoplace ?Chamber Serial #: [id number] ?Treatment Protocol: 2.0 ATA with 90 minutes oxygen, with two 5 minute air breaks ?Treatment Details ?Compression Rate Down: 1.0 psi / minute ?De-Compression Rate Up: 1.0 psi / minute ?A breaks and breathing ?ir ?Compress Tx Pressure periods Decompress Decompress ?Begins Reached (leave unused spaces Begins Ends ?blank) ?Chamber Pressure (ATA '1 2 2 2 2 2 '$ --2 1 ?) ?Clock Time (24 hr) 09:19 09:34 10:04 10:09 10:39 10:44 - - 11:14 11:30 ?Treatment Length: 131 (minutes) ?Treatment Segments: 4 ?Vital Signs ?Capillary Blood Glucose Reference Range: 80 - 120 mg / dl ?[name] Diabetic Blood Glucose Intervention Range: <131 mg/dl or >249 mg/dl ?Time Vitals Blood Respiratory Capillary Blood Glucose Pulse Action ?Type: ?Pulse: Temperature: ?Taken: ?Pressure: ?Rate: ?Glucose (mg/dl): ?Meter #: Oximetry (%) Taken: ?Pre 09:05 140/55 68 16 97.9 153 2 ?Post 11:31 134/63 66 16 97.7 139 2 ?Treatment Response ?Treatment Toleration: Well ?Treatment Completion Status: Treatment Completed without Adverse Event ?Treatment Notes ?Patient returns from [facility] ENT Note scanned to [facility]. Patient was safely placed in the chamber after  performing safety check and the chamber was ?. ?compressed at the lowest rate of 1 psi/min and decompressed at the rate of 1 psi/min, as well. Patient was happy the treatment went well and experienced no ?issues today. ?Physician [name] Attestation: ?I certify that I supervised this [name] treatment in accordance with Medicare ?guidelines. A trained emergency response team is readily available per Yes ?hospital policies and procedures. ?Continue HBOT as ordered. Yes ?Electronic Signature(s) ?Signed: 10/14/2021 4:04:03 PM By: [name] MD FACS ?Previous Signature: 10/14/2021 1:08:56 PM Version By: [name] CHT EMT BS ?, , ?Entered By: [name] on 10/14/2021 16:04:03 ?-------------------------------------------------------------------------------- ?[name] Safety Checklist Details ?Patient Name: ?Date of Service: ?[name], [name] 10/14/2021 9:00 A M ?Medical Record Number: [id number] ?Patient Account Number: [id number] ?Date of Birth/Sex: ?Treating RN: ?11-01-1955 (66 y.o. F) [name], [name] ?Primary Care Elpidia Karn: [name] ?Other Clinician: [name] ?Referring Aric Jost: ?Treating Tamyia Minich/Extender: [name] ?[name] ?[name]: 5 ?[name] Safety Checklist Items ?Safety Checklist ?Consent Form Signed ?Patient voided / foley secured and emptied ?When did you last eato 0815 ?Last dose of injectable or oral agent Ozempic taken this past Saturday ?Ostomy pouch emptied and vented if applicable Urostomy connected to collection bag. ?All implantable devices assessed, documented and approved Silicone Breast Implants ?Intravenous access site secured and place ?NA ?Valuables secured ?Linens and cotton and cotton/polyester blend (less than 51% polyester) ?Personal oil-based products / skin lotions / body lotions removed ?Wigs or hairpieces removed ?NA ?Smoking or tobacco materials removed ?NA ?Books / newspapers / magazines / loose paper removed ?Cologne, aftershave, perfume  and deodorant removed ?Jewelry removed (may wrap wedding band) ?Make-up removed ?Hair care products removed ?Battery operated devices (external) removed ?Heating patches and chemical warmers removed ?NA ?Titanium eyewear removed ?NA Frames are plastic. ?Nail polish cured greater than 10  hours ?NA ?Casting material cured greater than 10 hours ?NA ?Hearing aids removed ?NA ?Loose dentures or partials removed ?NA ?Prosthetics have been removed ?NA ?Patient demonstrates correct use of air break device (if applicable) ?Patient concerns have been addressed ?Patient grounding bracelet on and cord attached to chamber ?Specifics for Inpatients (complete in addition to above) ?Medication sheet sent with patient ?NA ?Intravenous medications needed or due during therapy sent with patient ?NA ?Drainage tubes (e.g. nasogastric tube or chest tube secured and vented) ?NA ?Endotracheal or Tracheotomy tube secured ?NA ?Cuff deflated of air and inflated with saline ?NA ?Airway suctioned ?NA ?Notes ?Paper version used prior to treatment. ?Electronic Signature(s) ?Signed: 10/14/2021 11:02:54 AM By: [name] CHT EMT BS ?, , ?Entered By: [name] on 10/14/2021 11:02:54 ?

## 2021-10-15 ENCOUNTER — Encounter (HOSPITAL_BASED_OUTPATIENT_CLINIC_OR_DEPARTMENT_OTHER): Payer: BC Managed Care – PPO | Admitting: General Surgery

## 2021-10-15 DIAGNOSIS — Z853 Personal history of malignant neoplasm of breast: Secondary | ICD-10-CM | POA: Diagnosis not present

## 2021-10-15 DIAGNOSIS — C549 Malignant neoplasm of corpus uteri, unspecified: Secondary | ICD-10-CM | POA: Diagnosis not present

## 2021-10-15 DIAGNOSIS — N3041 Irradiation cystitis with hematuria: Secondary | ICD-10-CM | POA: Diagnosis not present

## 2021-10-15 DIAGNOSIS — N950 Postmenopausal bleeding: Secondary | ICD-10-CM | POA: Diagnosis not present

## 2021-10-15 DIAGNOSIS — D709 Neutropenia, unspecified: Secondary | ICD-10-CM | POA: Diagnosis not present

## 2021-10-15 DIAGNOSIS — L59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: Secondary | ICD-10-CM | POA: Diagnosis not present

## 2021-10-15 DIAGNOSIS — Y842 Radiological procedure and radiotherapy as the cause of abnormal reaction of the patient, or of later complication, without mention of misadventure at the time of the procedure: Secondary | ICD-10-CM | POA: Diagnosis not present

## 2021-10-15 DIAGNOSIS — E119 Type 2 diabetes mellitus without complications: Secondary | ICD-10-CM | POA: Diagnosis not present

## 2021-10-15 DIAGNOSIS — C50212 Malignant neoplasm of upper-inner quadrant of left female breast: Secondary | ICD-10-CM | POA: Diagnosis not present

## 2021-10-15 LAB — GLUCOSE, CAPILLARY
Glucose-Capillary: 137 mg/dL — ABNORMAL HIGH (ref 70–99)
Glucose-Capillary: 132 mg/dL — ABNORMAL HIGH (ref 70–99)

## 2021-10-15 NOTE — Progress Notes (Signed)
SARH, KIRSCHENBAUM ([id number]) ?Visit Report for 10/15/2021 ?[name] Details ?Patient Name: Date of Service: ?HO [place], [name] 10/15/2021 10:30 A M ?Medical Record Number: [id number] ?Patient Account Number: [id number] ?Date of Birth/Sex: Treating RN: ?05/15/1956 (66 y.o. 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 ?[name] Treatment Details ?Treatment Number: 8 ?Patient Type: Outpatient ?Chamber Type: Monoplace ?Chamber Serial #: [id number] ?Treatment Protocol: 2.0 ATA with 90 minutes oxygen, with two 5 minute air breaks ?Treatment Details ?Compression Rate Down: 1.0 psi / minute ?De-Compression Rate Up: 1.0 psi / minute ?A breaks and breathing ?ir ?Compress Tx Pressure periods Decompress Decompress ?Begins Reached (leave unused spaces Begins Ends ?blank) ?Chamber Pressure (ATA '1 2 2 2 2 2 '$ --2 1 ?) ?Clock Time (24 hr) 10:52 11:09 11:09 11:39 12:14 12:19 - - 12:39 12:53 ?Treatment Length: 121 (minutes) ?Treatment Segments: 4 ?Vital Signs ?Capillary Blood Glucose Reference Range: 80 - 120 mg / dl ?[name] Diabetic Blood Glucose Intervention Range: <131 mg/dl or >249 mg/dl ?Time Vitals Blood Respiratory Capillary Blood Glucose Pulse Action ?Type: ?Pulse: Temperature: ?Taken: ?Pressure: ?Rate: ?Glucose (mg/dl): ?Meter #: Oximetry (%) Taken: ?Pre 10:44 127/60 75 16 97.6 137 2 ?Post 13:08 124/60 67 16 97.6 132 2 ?Treatment Response ?Treatment Toleration: Well ?Treatment Completion Status: Treatment Completed without Adverse Event ?Physician [name] Attestation: ?I certify that I supervised this [name] treatment in accordance with Medicare ?guidelines. A  trained emergency response team is readily available per Yes ?hospital policies and procedures. ?Continue HBOT as ordered. Yes ?Electronic Signature(s) ?Signed: 10/15/2021 4:37:12 PM By: [name] Maudlin MD FACS ?Previous Signature: 10/15/2021 11:12:44 AM Version By: [name] CHT EMT BS ?, , ?Entered By: [name] on 10/15/2021 16:37:12 ?-------------------------------------------------------------------------------- ?[name] Safety Checklist Details ?Patient Name: ?Date of Service: ?[name], [name] 10/15/2021 10:30 A M ?Medical Record Number: [id number] ?Patient Account Number: [id number] ?Date of Birth/Sex: ?Treating RN: ?1956-01-19 (66 y.o. [name], [name] ?Primary Care [name]: [name] ?Other Clinician: Donavan [name] ?Referring [name]: ?Treating [name]: [name] ?[name] ?[name]: 6 ?[name] Safety Checklist Items ?Safety Checklist ?Consent Form Signed ?Patient voided / foley secured and emptied ?When did you last eato 0830 ?Last dose of injectable or oral agent 10/10/2021 Ozempic ?Ostomy pouch emptied and vented if applicable Urostomy connected to collection bag. ?All implantable devices assessed, documented and approved Silicone Breast Implants ?Intravenous access site secured and place ?NA ?Valuables secured ?Linens and cotton and cotton/polyester blend (less than 51% polyester) ?Personal oil-based products / skin lotions / body lotions removed ?Wigs or hairpieces removed ?NA ?Smoking or tobacco materials removed ?NA ?Books / newspapers / magazines / loose paper removed ?Cologne, aftershave, perfume and deodorant removed ?Jewelry removed (may wrap wedding band) ?Make-up removed ?Hair care products removed ?Battery operated devices (external) removed ?Heating patches and chemical warmers removed ?Titanium eyewear removed ?NA Plastic frames ?Nail polish cured greater than 10 hours ?NA ?Casting material cured greater than 10 hours ?NA ?Hearing  aids removed ?NA ?Loose dentures or partials removed ?NA ?Prosthetics have been removed ?NA ?Patient demonstrates correct use of air break device (if applicable) ?Patient concerns have been addressed ?Patient grounding bracelet on and cord attached to chamber ?Specifics for Inpatients (complete in addition to above) ?Medication sheet sent with patient ?  NA ?Intravenous medications needed or due during therapy sent with patient ?NA ?Drainage tubes (e.g. nasogastric tube or chest tube secured and vented) ?NA ?Endotracheal or Tracheotomy tube secured ?NA ?Cuff deflated of air and inflated with saline ?NA ?Airway suctioned ?NA ?Notes ?Paper version used prior to treatment. ?Electronic Signature(s) ?Signed: 10/15/2021 11:12:44 AM By: [name] CHT EMT BS ?, , ?Entered By: [name] on 10/15/2021 11:06:56 ?

## 2021-10-15 NOTE — Progress Notes (Addendum)
[name], [name] ([id number]) ?Visit Report for 10/15/2021 ?Arrival Information Details ?Patient Name: Date of Service: ?HO [place], [name] 10/15/2021 10:30 A M ?Medical Record Number: [id number] ?Patient Account Number: [id number] ?Date of Birth/Sex: Treating RN: ?Aug 05, 1955 (66 y.o. [name], [name] ?Primary Care [name]: [name] ?Other Clinician: Donavan [name] ?Referring [name]: ?Treating [name]/Extender: [name] ?[name] ?[name]: 6 ?Visit Information History Since Last Visit ?All ordered tests and consults were completed: Yes ?Patient Arrived: Ambulatory ?Added or deleted any medications: No ?Arrival Time: 10:19 ?Any new allergies or adverse reactions: No ?Accompanied By: self ?Had a fall or experienced change in No ?Transfer Assistance: None ?activities of daily living that may affect ?Patient Identification Verified: Yes ?risk of falls: ?Secondary Verification Process Completed: Yes ?Signs or symptoms of abuse/neglect since last visito No ?Patient Requires Transmission-Based Precautions: No ?Hospitalized since last visit: No ?Patient Has Alerts: Yes ?Implantable device outside of the clinic excluding No ?Patient Alerts: Patient on Blood Thinner cellular tissue based products placed in the center ?since last visit: ?Pain Present Now: No ?Electronic Signature(s) ?Signed: 10/15/2021 11:02:51 AM By: [name] CHT EMT BS ?, , ?Entered By: [name] on 10/15/2021 11:02:51 ?-------------------------------------------------------------------------------- ?Encounter Discharge Information Details ?Patient Name: Date of Service: ?HO [place], [name] 10/15/2021 10:30 A M ?Medical Record Number: [id number] ?Patient Account Number: [id number] ?Date of Birth/Sex: Treating RN: ?1956-05-28 (66 y.o. [name], [name] ?Primary Care [name]: [name] ?Other Clinician: Donavan [name] ?Referring [name]: ?Treating [name]/Extender: [name] ?[name] ?[name]: 6 ?Encounter Discharge Information Items ?Discharge Condition: Stable ?Ambulatory Status: Ambulatory ?Discharge Destination: Home ?Transportation: Private Auto ?Accompanied By: self ?Schedule Follow-up Appointment: No ?Clinical Summary of Care: ?Electronic Signature(s) ?Signed: 10/16/2021 2:03:43 PM By: [name] CHT EMT BS ?, , ?Entered By: [name] on 10/15/2021 13:29:05 ?-------------------------------------------------------------------------------- ?Vitals Details ?Patient Name: ?Date of Service: ?[name], [name] 10/15/2021 10:30 A M ?Medical Record Number: [id number] ?Patient Account Number: [id number] ?Date of Birth/Sex: ?Treating RN: ?[birth year] (66 y.o. [name], [name] ?Primary Care [name]: [name] ?Other Clinician: Donavan [name] ?Referring [name]: ?Treating [name]/Extender: [name] ?[name] ?[name]: 6 ?Vital Signs ?Time Taken: 10:44 ?Temperature (??F): 97.6 ?Height (in): 62 ?Pulse (bpm): 75 ?Weight (lbs): 160 ?Respiratory Rate (breaths/min): 16 ?Body Mass Index (BMI): 29.3 ?Blood Pressure (mmHg): 127/60 ?Capillary Blood Glucose (mg/dl): 137 ?Reference Range: 80 - 120 mg / dl ?Electronic Signature(s) ?Signed: 10/15/2021 11:12:44 AM By: [name] CHT EMT BS ?, , ?Entered By: [name] on 10/15/2021 11:04:31 ?

## 2021-10-15 NOTE — Progress Notes (Addendum)
[name], [name] ([id number]) ?Visit Report for 10/15/2021 ?Problem List Details ?Patient Name: Date of Service: ?HO [place], [name] 10/15/2021 10:30 A M ?Medical Record Number: [id number] ?Patient Account Number: [id number] ?Date of Birth/Sex: Treating RN: ?February 12, 1956 (66 y.o. 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 10/15/2021 ?Medical Record Number: [id number] ?Patient Account Number: [id number] ?Date of Birth/Sex: ?Treating RN: ?[birth year] (66 y.o. 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 ?Facility Procedures ?CPT4 Code: [id number] ?Description: G0277-(Facility Use Only) HBOT full body chamber, 54mn , ICD-10 Diagnosis Description 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 C54.9  ?Malignant neoplasm of corpus uteri, 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 ?Modifier: n ?Quantity: 4 ?Physician Procedures ?: CPT4 Code Description Modifier [id number]- WC PHYS HYPERBARIC OXYGEN THERAPY ICD-10 Diagnosis Description 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

## 2021-10-16 ENCOUNTER — Encounter (HOSPITAL_BASED_OUTPATIENT_CLINIC_OR_DEPARTMENT_OTHER): Payer: BC Managed Care – PPO | Admitting: General Surgery

## 2021-10-16 ENCOUNTER — Other Ambulatory Visit: Payer: Self-pay

## 2021-10-16 DIAGNOSIS — D709 Neutropenia, unspecified: Secondary | ICD-10-CM | POA: Diagnosis not present

## 2021-10-16 DIAGNOSIS — Y842 Radiological procedure and radiotherapy as the cause of abnormal reaction of the patient, or of later complication, without mention of misadventure at the time of the procedure: Secondary | ICD-10-CM | POA: Diagnosis not present

## 2021-10-16 DIAGNOSIS — Z853 Personal history of malignant neoplasm of breast: Secondary | ICD-10-CM | POA: Diagnosis not present

## 2021-10-16 DIAGNOSIS — E119 Type 2 diabetes mellitus without complications: Secondary | ICD-10-CM | POA: Diagnosis not present

## 2021-10-16 DIAGNOSIS — N950 Postmenopausal bleeding: Secondary | ICD-10-CM | POA: Diagnosis not present

## 2021-10-16 DIAGNOSIS — L59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: Secondary | ICD-10-CM | POA: Diagnosis not present

## 2021-10-16 DIAGNOSIS — N3041 Irradiation cystitis with hematuria: Secondary | ICD-10-CM | POA: Diagnosis not present

## 2021-10-16 DIAGNOSIS — C549 Malignant neoplasm of corpus uteri, unspecified: Secondary | ICD-10-CM | POA: Diagnosis not present

## 2021-10-16 DIAGNOSIS — C50212 Malignant neoplasm of upper-inner quadrant of left female breast: Secondary | ICD-10-CM | POA: Diagnosis not present

## 2021-10-16 LAB — GLUCOSE, CAPILLARY
Glucose-Capillary: 163 mg/dL — ABNORMAL HIGH (ref 70–99)
Glucose-Capillary: 108 mg/dL — ABNORMAL HIGH (ref 70–99)

## 2021-10-16 NOTE — Progress Notes (Signed)
ICY, FUHRMANN ([id number]) ?Visit Report for 10/16/2021 ?[name] Details ?Patient Name: Date of Service: ?HO [place], [name] 10/16/2021 10:30 A M ?Medical Record Number: [id number] ?Patient Account Number: [id number] ?Date of Birth/Sex: Treating RN: ?06-16-1956 (66 y.o. 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 ?[name] Treatment Details ?Treatment Number: 3 ?Patient Type: Outpatient ?Chamber Type: Monoplace ?Chamber Serial #: [id number] ?Treatment Protocol: 2.0 ATA with 90 minutes oxygen, with two 5 minute air breaks ?Treatment Details ?Compression Rate Down: 1.0 psi / minute ?De-Compression Rate Up: 1.0 psi / minute ?A breaks and breathing ?ir ?Compress Tx Pressure periods Decompress Decompress ?Begins Reached (leave unused spaces Begins Ends ?blank) ?Chamber Pressure (ATA '1 2 2 2 2 2 '$ --2 1 ?) ?Clock Time (24 hr) 10:42 11:02 11:32 11:37 12:07 12:12 - - 12:42 12:58 ?Treatment Length: 136 (minutes) ?Treatment Segments: 5 ?Vital Signs ?Capillary Blood Glucose Reference Range: 80 - 120 mg / dl ?[name] Diabetic Blood Glucose Intervention Range: <131 mg/dl or >249 mg/dl ?Type: Time Vitals Blood Pulse: Respiratory Temperature: Capillary Blood Glucose Pulse Action ?Taken: ?Pressure: ?Rate: ?Glucose (mg/dl): Meter #: Oximetry (%) Taken: ?Pre 10:18 147/58 63 18 97.8 163 ?Post 13:05 145/66 60 18 97.6 108 discharge per protocol >101 mg/dL ?Treatment Response ?Treatment Toleration: Well ?Treatment Completion Status: Treatment Completed without Adverse Event ?Physician [name] Attestation: ?I certify that I supervised this [name] treatment in accordance with  Medicare ?guidelines. A trained emergency response team is readily available per Yes ?hospital policies and procedures. ?Continue HBOT as ordered. Yes ?Electronic Signature(s) ?Signed: 10/16/2021 1:59:40 PM By: [name] MD FACS ?Entered By: [name] on 10/16/2021 13:59:40 ?-------------------------------------------------------------------------------- ?[name] Safety Checklist Details ?Patient Name: ?Date of Service: ?[name], [name] 10/16/2021 10:30 A M ?Medical Record Number: [id number] ?Patient Account Number: [id number] ?Date of Birth/Sex: ?Treating RN: ?[birth year] (67 y.o. [name], [name] ?Primary Care Wylie Russon: [name] ?Other Clinician: [name] ?Referring Ardella Chhim: ?Treating Ziya Coonrod/Extender: [name] ?[name] ?[name]: 6 ?[name] Safety Checklist Items ?Safety Checklist ?Consent Form Signed ?Patient voided / foley secured and emptied ?When did you last eato 0830 ?Last dose of injectable or oral agent 10/10/21 Ozempic ?Ostomy pouch emptied and vented if applicable Urostomy connected to collection bag ?All implantable devices assessed, documented and approved Silicone Implants ?Intravenous access site secured and place ?NA ?Valuables secured ?Linens and cotton and cotton/polyester blend (less than 51% polyester) ?Personal oil-based products / skin lotions / body lotions removed ?Wigs or hairpieces removed ?NA ?Smoking or tobacco materials removed ?NA ?Books / newspapers / magazines / loose paper removed ?Cologne, aftershave, perfume and deodorant removed ?Jewelry removed (may wrap wedding band) ?Make-up removed ?Hair care products removed ?Battery operated devices (external) removed ?Heating patches and chemical warmers removed ?Titanium eyewear removed ?NA Plastic Frames ?Nail polish cured greater than 10 hours ?NA ?Casting material cured greater than 10 hours ?NA ?Hearing aids removed ?NA ?Loose dentures or partials removed ?NA ?Prosthetics have  been removed ?NA ?Patient demonstrates correct use of air break device (if applicable) ?Patient concerns have been addressed ?Patient grounding bracelet on and cord attached to chamber ?Specifics for Inpatients (complete in addition to above) ?Medication sheet sent with patient ?NA ?Intravenous medications needed or due during therapy sent with patient ?NA ?  Drainage tubes (e.g. nasogastric tube or chest tube secured and vented) ?NA ?Endotracheal or Tracheotomy tube secured ?NA ?Cuff deflated of air and inflated with saline ?NA ?Airway suctioned ?NA ?Notes ?Paper version used prior to treatment. ?Electronic Signature(s) ?Signed: 10/16/2021 2:03:43 PM By: [name] CHT EMT BS ?, , ?Entered By: [name] on 10/16/2021 11:38:34 ?

## 2021-10-16 NOTE — Progress Notes (Signed)
[name], HUSSAR ([id number]) ?Visit Report for 10/16/2021 ?Arrival Information Details ?Patient Name: Date of Service: ?HO [place], [name] 10/16/2021 10:30 A M ?Medical Record Number: [id number] ?Patient Account Number: [id number] ?Date of Birth/Sex: Treating RN: ?01/05/1956 (66 y.o. [name], [name] ?Primary Care Altair Appenzeller: [name] ?Other Clinician: [name] ?Referring Kuulei Kleier: ?Treating Kymoni Monday/Extender: [name] ?[name] ?[name]: 6 ?Visit Information History Since Last Visit ?All ordered tests and consults were completed: Yes ?Patient Arrived: Ambulatory ?Added or deleted any medications: No ?Arrival Time: 10:15 ?Any new allergies or adverse reactions: No ?Accompanied By: self ?Had a fall or experienced change in No ?Transfer Assistance: None ?activities of daily living that may affect ?Patient Identification Verified: Yes ?risk of falls: ?Secondary Verification Process Completed: Yes ?Signs or symptoms of abuse/neglect since last visito No ?Patient Requires Transmission-Based Precautions: No ?Hospitalized since last visit: No ?Patient Has Alerts: Yes ?Implantable device outside of the clinic excluding No ?Patient Alerts: Patient on Blood Thinner cellular tissue based products placed in the center ?since last visit: ?Pain Present Now: No ?Electronic Signature(s) ?Signed: 10/16/2021 2:03:43 PM By: [name] CHT EMT BS ?, , ?Entered By: [name] on 10/16/2021 11:33:59 ?-------------------------------------------------------------------------------- ?Encounter Discharge Information Details ?Patient Name: Date of Service: ?HO [place], [name] 10/16/2021 10:30 A M ?Medical Record Number: [id number] ?Patient Account Number: [id number] ?Date of Birth/Sex: Treating RN: ?11-19-1955 (66 y.o. [name], [name] ?Primary Care Claud Gowan: [name] ?Other Clinician: [name] ?Referring Tyffany Waldrop: ?Treating Asley Baskerville/Extender: [name] ?[name] ?[name]: 6 ?Encounter Discharge Information Items ?Discharge Condition: Stable ?Ambulatory Status: Ambulatory ?Discharge Destination: Home ?Transportation: Private Auto ?Accompanied By: self ?Schedule Follow-up Appointment: No ?Clinical Summary of Care: ?Electronic Signature(s) ?Signed: 10/16/2021 2:03:43 PM By: [name] CHT EMT BS ?, , ?Entered By: [name] on 10/16/2021 13:47:01 ?-------------------------------------------------------------------------------- ?Vitals Details ?Patient Name: ?Date of Service: ?[name], [name] 10/16/2021 10:30 A M ?Medical Record Number: [id number] ?Patient Account Number: [id number] ?Date of Birth/Sex: ?Treating RN: ?[birth year] (66 y.o. [name], [name] ?Primary Care Merrill Villarruel: [name] ?Other Clinician: [name] ?Referring Kodi Guerrera: ?Treating Nichola Cieslinski/Extender: [name] ?[name] ?[name]: 6 ?Vital Signs ?Time Taken: 10:18 ?Temperature (??F): 97.8 ?Height (in): 62 ?Pulse (bpm): 63 ?Weight (lbs): 160 ?Respiratory Rate (breaths/min): 18 ?Body Mass Index (BMI): 29.3 ?Blood Pressure (mmHg): 147/58 ?Capillary Blood Glucose (mg/dl): 163 ?Reference Range: 80 - 120 mg / dl ?Electronic Signature(s) ?Signed: 10/16/2021 2:03:43 PM By: [name] CHT EMT BS ?, , ?Entered By: [name] on 10/16/2021 11:34:45 ?

## 2021-10-16 NOTE — Progress Notes (Signed)
CELISE, BAZAR ([id number]) ?Visit Report for 10/16/2021 ?SuperBill Details ?Patient Name: Date of Service: ?[name] 10/16/2021 ?Medical Record Number: [id number] ?Patient Account Number: [id number] ?Date of Birth/Sex: Treating RN: ?[birth year] (66 y.o. 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 ?Facility Procedures ?CPT4 Code Description Modifier Quantity ?90383338 G0277-(Facility Use Only) HBOT full body chamber, 69mn ?, 5 ?ICD-10 Diagnosis Description ?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 ?N30.41 Irradiation cystitis with hematuria ?N95.0 Postmenopausal bleeding ?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

## 2021-10-19 ENCOUNTER — Encounter (HOSPITAL_BASED_OUTPATIENT_CLINIC_OR_DEPARTMENT_OTHER): Payer: BC Managed Care – PPO | Admitting: Internal Medicine

## 2021-10-19 ENCOUNTER — Other Ambulatory Visit: Payer: Self-pay

## 2021-10-19 DIAGNOSIS — N950 Postmenopausal bleeding: Secondary | ICD-10-CM | POA: Diagnosis not present

## 2021-10-19 DIAGNOSIS — E119 Type 2 diabetes mellitus without complications: Secondary | ICD-10-CM | POA: Diagnosis not present

## 2021-10-19 DIAGNOSIS — Z853 Personal history of malignant neoplasm of breast: Secondary | ICD-10-CM | POA: Diagnosis not present

## 2021-10-19 DIAGNOSIS — L59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: Secondary | ICD-10-CM

## 2021-10-19 DIAGNOSIS — N3041 Irradiation cystitis with hematuria: Secondary | ICD-10-CM | POA: Diagnosis not present

## 2021-10-19 DIAGNOSIS — D709 Neutropenia, unspecified: Secondary | ICD-10-CM | POA: Diagnosis not present

## 2021-10-19 DIAGNOSIS — C50212 Malignant neoplasm of upper-inner quadrant of left female breast: Secondary | ICD-10-CM | POA: Diagnosis not present

## 2021-10-19 DIAGNOSIS — C549 Malignant neoplasm of corpus uteri, unspecified: Secondary | ICD-10-CM | POA: Diagnosis not present

## 2021-10-19 DIAGNOSIS — Y842 Radiological procedure and radiotherapy as the cause of abnormal reaction of the patient, or of later complication, without mention of misadventure at the time of the procedure: Secondary | ICD-10-CM | POA: Diagnosis not present

## 2021-10-19 DIAGNOSIS — Z8543 Personal history of malignant neoplasm of ovary: Secondary | ICD-10-CM | POA: Diagnosis not present

## 2021-10-19 LAB — GLUCOSE, CAPILLARY
Glucose-Capillary: 134 mg/dL — ABNORMAL HIGH (ref 70–99)
Glucose-Capillary: 129 mg/dL — ABNORMAL HIGH (ref 70–99)

## 2021-10-19 NOTE — Progress Notes (Addendum)
MONSERATH, NEFF ([id number]) ?Visit Report for 10/19/2021 ?Problem List Details ?Patient Name: Date of Service: ?[name], [name] [name] 10/19/2021 9:00 A M ?Medical Record Number: [id number] ?Patient Account Number: [id number] ?Date of Birth/Sex: Treating RN: ?21-Nov-1955 (66 y.o. 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 10/19/2021 ?Medical Record Number: [id number] ?Patient Account Number: [id number] ?Date of Birth/Sex: ?Treating RN: ?1955/12/16 (66 y.o. 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, unspecified ?N95.0 Postmenopausal bleeding ?D70.9 Neutropenia, unspecified ?E11.9 Type 2 diabetes mellitus without complications ?Facility Procedures ?CPT4 Code: 01749449 ?Description: G0277-(Facility Use Only) HBOT full body chamber, 4mn , ICD-10 Diagnosis Description 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 N95.0  ?Postmenopausal bleeding N30.41 Irradiation cystitis with hematuria ?Modifier: n ?Quantity: 4 ?Physician Procedures ?: CPT4 Code Description Modifier [id number]- WC PHYS HYPERBARIC OXYGEN THERAPY ICD-10 Diagnosis Description 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

## 2021-10-19 NOTE — Progress Notes (Addendum)
SHELBY, PELTZ ([id number]) ?Visit Report for 10/19/2021 ?Arrival Information Details ?Patient Name: Date of Service: ?[name], [name] [name] 10/19/2021 9:00 A M ?Medical Record Number: [id number] ?Patient Account Number: [id number] ?Date of Birth/Sex: Treating RN: ?[birth year] (66 y.o. [name], [name] ?Primary Care [name]: [name] ?Other Clinician: [name] ?Referring Andrw Mcguirt: ?Treating Gio Janoski/Extender: [name] ?[name] ?[name]: 6 ?Visit Information History Since Last Visit ?All ordered tests and consults were completed: Yes ?Patient Arrived: Ambulatory ?Added or deleted any medications: No ?Arrival Time: 08:57 ?Any new allergies or adverse reactions: No ?Accompanied By: self ?Had a fall or experienced change in No ?Transfer Assistance: None ?activities of daily living that may affect ?Patient Identification Verified: Yes ?risk of falls: ?Secondary Verification Process Completed: Yes ?Signs or symptoms of abuse/neglect since last visito No ?Patient Requires Transmission-Based Precautions: No ?Hospitalized since last visit: No ?Patient Has Alerts: Yes ?Implantable device outside of the clinic excluding No ?Patient Alerts: Patient on Blood Thinner cellular tissue based products placed in the center ?since last visit: ?Pain Present Now: No ?Electronic Signature(s) ?Signed: 10/19/2021 9:32:31 AM By: [name] CHT EMT BS ?, , ?Entered By: [name] on 10/19/2021 09:32:31 ?-------------------------------------------------------------------------------- ?Encounter Discharge Information Details ?Patient Name: Date of Service: ?[name], [name] [name] 10/19/2021 9:00 A M ?Medical Record Number: [id number] ?Patient Account Number: [id number] ?Date of Birth/Sex: Treating RN: ?1955/08/31 (66 y.o. [name], [name] ?Primary Care Toivo Bordon: [name] ?Other Clinician: [name] ?Referring Leeloo Silverthorne: ?Treating Jorene Kaylor/Extender: [name] ?[name] ?[name]: 6 ?Encounter Discharge Information Items ?Discharge Condition: Stable ?Ambulatory Status: Ambulatory ?Discharge Destination: Home ?Transportation: Private Auto ?Accompanied By: None ?Schedule Follow-up Appointment: Yes ?Clinical Summary of Care: ?Electronic Signature(s) ?Signed: 10/19/2021 1:23:24 PM By: [name] EMT ?Entered By: [name] on 10/19/2021 13:23:24 ?-------------------------------------------------------------------------------- ?Vitals Details ?Patient Name: ?Date of Service: ?[name], [name] 10/19/2021 9:00 A M ?Medical Record Number: [id number] ?Patient Account Number: [id number] ?Date of Birth/Sex: ?Treating RN: ?Jan 20, 1956 (66 y.o. [name], [name] ?Primary Care Riese Hellard: [name] ?Other Clinician: [name] ?Referring Crescentia Boutwell: ?Treating Keyshawna Prouse/Extender: [name] ?[name] ?[name]: 6 ?Vital Signs ?Time Taken: 09:17 ?Temperature (??F): 97.9 ?Height (in): 62 ?Pulse (bpm): 64 ?Weight (lbs): 160 ?Respiratory Rate (breaths/min): 16 ?Body Mass Index (BMI): 29.3 ?Blood Pressure (mmHg): 117/72 ?Capillary Blood Glucose (mg/dl): 134 ?Reference Range: 80 - 120 mg / dl ?Electronic Signature(s) ?Signed: 10/19/2021 9:33:15 AM By: [name] CHT EMT BS ?, , ?Entered By: [name] on 10/19/2021 09:33:14 ?

## 2021-10-19 NOTE — Progress Notes (Addendum)
[name], [name] ([id number]) ?Visit Report for 10/19/2021 ?[name] Details ?Patient Name: Date of Service: ?HO [name], [name] 10/19/2021 9:00 A M ?Medical Record Number: [id number] ?Patient Account Number: [id number] ?Date of Birth/Sex: Treating RN: ?26-Feb-1956 (66 y.o. 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Treatment Segments: 4 ?Vital Signs ?Capillary Blood Glucose Reference Range: 80 - 120 mg / dl ?[name] Diabetic Blood Glucose Intervention Range: <131 mg/dl or >249 mg/dl ?Time Vitals Blood Respiratory Capillary Blood Glucose Pulse Action ?Type: ?Pulse: Temperature: ?Taken: ?Pressure: ?Rate: ?Glucose (mg/dl): ?Meter #: Oximetry (%) Taken: ?Pre 09:17 117/72 64 16 97.9 134 ?Post 11:47 142/69 61 16 97.5 129 ?Treatment Response ?Treatment Toleration: Well ?Treatment Completion Status: Treatment Completed without Adverse Event ?Treatment Notes ?Paper version used prior to charting. The patient stated that she had some sinus drainage, she said that she  had used some nose spray at home, before ?coming in for her treatment. No problems during her treatment today. I asked her after her treatment, if she had any problems after today's treatment. She ?stated no. ?Physician [name] Attestation: ?I certify that I supervised this [name] treatment in accordance with Medicare ?guidelines. A trained emergency response team is readily available per Yes ?hospital policies and procedures. ?Continue HBOT as ordered. Yes ?Electronic Signature(s) ?Signed: 10/19/2021 2:50:28 PM By: [name] Shan DO ?Previous Signature: 10/19/2021 1:21:09 PM Version By: [name] EMT ?Previous Signature: 10/19/2021 10:33:58 AM Version By: [name] CHT EMT BS ?, , ?Entered By: [name] on 10/19/2021 14:49:11 ?-------------------------------------------------------------------------------- ?[name] Safety Checklist Details ?Patient Name: ?Date of Service: ?[name], [name] 10/19/2021 9:00 A M ?Medical Record Number: [id number] ?Patient Account Number: [id number] ?Date of Birth/Sex: ?Treating RN: ?04/28/1956 (66 y.o. [name], [name] ?Primary Care Hadessah Grennan: [name] ?Other Clinician: Donavan [name] ?Referring Serayah Yazdani: ?Treating Vishal Sandlin/Extender: [name] ?[name] ?[name]: 6 ?[name] Safety Checklist Items ?Safety Checklist ?Consent Form Signed ?Patient voided / foley secured and emptied ?When did you last eato 0800 ?Last dose of injectable or oral agent 10/17/2021 Ozempic ?Ostomy pouch emptied and vented if applicable ?All implantable devices assessed, documented and approved Silicone Breast Implants ?Intravenous access site secured and place ?NA ?Valuables secured ?Linens and cotton and cotton/polyester blend (less than 51% polyester) ?Personal oil-based products / skin lotions / body lotions removed ?Wigs or hairpieces removed ?NA ?Smoking or tobacco materials removed ?NA ?Books / newspapers / magazines / loose paper removed ?Cologne, aftershave,  perfume and deodorant removed ?Jewelry removed (may wrap wedding band) ?Make-up removed ?Hair care products removed ?Battery operated devices (external) removed ?Heating patches and chemical warmers removed ?Titanium eyewear removed ?NA Frames are plastic ?Nail polish cured greater than 10 hours ?NA ?  Casting material cured greater than 10 hours ?NA ?Hearing aids removed ?NA ?Loose dentures or partials removed ?NA ?Prosthetics have been removed ?NA ?Patient demonstrates correct use of air break device (if applicable) ?Patient concerns have been addressed ?Patient grounding bracelet on and cord attached to chamber ?Specifics for Inpatients (complete in addition to above) ?Medication sheet sent with patient ?NA ?Intravenous medications needed or due during therapy sent with patient ?NA ?Drainage tubes (e.g. nasogastric tube or chest tube secured and vented) ?NA ?Endotracheal or Tracheotomy tube secured ?NA ?Cuff deflated of air and inflated with saline ?NA ?Airway suctioned ?NA ?Notes ?Paper version used prior to treatment. ?Electronic Signature(s) ?Signed: 10/19/2021 9:36:50 AM By: [name] CHT EMT BS ?, , ?Entered By: [name] on 10/19/2021 09:36:50 ?

## 2021-10-20 ENCOUNTER — Encounter (HOSPITAL_BASED_OUTPATIENT_CLINIC_OR_DEPARTMENT_OTHER): Payer: BC Managed Care – PPO | Admitting: Internal Medicine

## 2021-10-20 DIAGNOSIS — C50212 Malignant neoplasm of upper-inner quadrant of left female breast: Secondary | ICD-10-CM | POA: Diagnosis not present

## 2021-10-20 DIAGNOSIS — N950 Postmenopausal bleeding: Secondary | ICD-10-CM | POA: Diagnosis not present

## 2021-10-20 DIAGNOSIS — Z8543 Personal history of malignant neoplasm of ovary: Secondary | ICD-10-CM | POA: Diagnosis not present

## 2021-10-20 DIAGNOSIS — L59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: Secondary | ICD-10-CM

## 2021-10-20 DIAGNOSIS — C549 Malignant neoplasm of corpus uteri, unspecified: Secondary | ICD-10-CM

## 2021-10-20 DIAGNOSIS — E119 Type 2 diabetes mellitus without complications: Secondary | ICD-10-CM | POA: Diagnosis not present

## 2021-10-20 DIAGNOSIS — Z853 Personal history of malignant neoplasm of breast: Secondary | ICD-10-CM | POA: Diagnosis not present

## 2021-10-20 DIAGNOSIS — N3041 Irradiation cystitis with hematuria: Secondary | ICD-10-CM

## 2021-10-20 DIAGNOSIS — Y842 Radiological procedure and radiotherapy as the cause of abnormal reaction of the patient, or of later complication, without mention of misadventure at the time of the procedure: Secondary | ICD-10-CM | POA: Diagnosis not present

## 2021-10-20 DIAGNOSIS — D709 Neutropenia, unspecified: Secondary | ICD-10-CM | POA: Diagnosis not present

## 2021-10-20 LAB — GLUCOSE, CAPILLARY
Glucose-Capillary: 133 mg/dL — ABNORMAL HIGH (ref 70–99)
Glucose-Capillary: 111 mg/dL — ABNORMAL HIGH (ref 70–99)

## 2021-10-20 NOTE — Progress Notes (Signed)
BERDINA, [name] ([id number]) ?Visit Report for 10/20/2021 ?Arrival Information Details ?Patient Name: Date of Service: ?[name] [name], [name] 10/20/2021 9:00 A M ?Medical Record Number: [id number] ?Patient Account Number: [id number] ?Date of Birth/Sex: Treating RN: ?October 29, 1955 (66 y.o. [name], [name] ?Primary Care Glennda Weatherholtz: [name] ?Other Clinician: [name] ?Referring Yosselyn Tax: ?Treating Niharika Savino/Extender: [name] ?[name] ?[name]: 6 ?Visit Information History Since Last Visit ?All ordered tests and consults were completed: Yes ?Patient Arrived: Ambulatory ?Added or deleted any medications: No ?Arrival Time: 10:10 ?Any new allergies or adverse reactions: No ?Accompanied By: self ?Had a fall or experienced change in No ?Transfer Assistance: None ?activities of daily living that may affect ?Patient Identification Verified: Yes ?risk of falls: ?Secondary Verification Process Completed: Yes ?Signs or symptoms of abuse/neglect since last visito No ?Patient Requires Transmission-Based Precautions: No ?Hospitalized since last visit: No ?Patient Has Alerts: Yes ?Implantable device outside of the clinic excluding No ?Patient Alerts: Patient on Blood Thinner cellular tissue based products placed in the center ?since last visit: ?Pain Present Now: No ?Electronic Signature(s) ?Signed: 10/20/2021 4:12:57 PM By: [name] CHT EMT BS ?, , ?Entered By: [name] on 10/20/2021 13:27:55 ?-------------------------------------------------------------------------------- ?Encounter Discharge Information Details ?Patient Name: Date of Service: ?[name] [name], [name] 10/20/2021 9:00 A M ?Medical Record Number: [id number] ?Patient Account Number: [id number] ?Date of Birth/Sex: Treating RN: ?08-21-1955 (66 y.o. [name], [name] ?Primary Care Gorgeous Newlun: [name] Other Clinician: ?Referring Elvie Palomo: ?Treating Kasim Mccorkle/Extender: [name] ?[name] ?Weeks in  Treatment: 6 ?Encounter Discharge Information Items ?Discharge Condition: Stable ?Ambulatory Status: Ambulatory ?Discharge Destination: Home ?Transportation: Private Auto ?Accompanied By: None ?Schedule Follow-up Appointment: Yes ?Clinical Summary of Care: ?Electronic Signature(s) ?Signed: 10/20/2021 3:37:40 PM By: [name] EMT ?Entered By: [name] on 10/20/2021 15:37:40 ?-------------------------------------------------------------------------------- ?Vitals Details ?Patient Name: ?Date of Service: ?[name] [name], [name] 10/20/2021 9:00 A M ?Medical Record Number: [id number] ?Patient Account Number: [id number] ?Date of Birth/Sex: ?Treating RN: ?[birth year] (66 y.o. [name], [name] ?Primary Care Chuckie Mccathern: [name] ?Other Clinician: [name] ?Referring Eaton Folmar: ?Treating Ramsha Lonigro/Extender: [name] ?[name] ?[name]: 6 ?Vital Signs ?Time Taken: 10:24 ?Temperature (??F): 97.8 ?Height (in): 62 ?Pulse (bpm): 60 ?Weight (lbs): 160 ?Blood Pressure (mmHg): 138/48 ?Body Mass Index (BMI): 29.3 ?Capillary Blood Glucose (mg/dl): 133 ?Reference Range: 80 - 120 mg / dl ?Electronic Signature(s) ?Signed: 10/20/2021 4:12:57 PM By: [name] CHT EMT BS ?, , ?Entered By: [name] on 10/20/2021 13:29:14 ?

## 2021-10-20 NOTE — Progress Notes (Signed)
[name], [name] ([id number]) ?Visit Report for 10/20/2021 ?Problem List Details ?Patient Name: Date of Service: ?[name], [name] 10/20/2021 9:00 A M ?Medical Record Number: [id number] ?Patient Account Number: [id number] ?Date of Birth/Sex: Treating RN: ?08/11/1955 (66 y.o. 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 10/20/2021 ?Medical Record Number: [id number] ?Patient Account Number: [id number] ?Date of Birth/Sex: ?Treating RN: ?[birth year] (66 y.o. 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, unspecified ?N95.0 Postmenopausal bleeding ?D70.9 Neutropenia, unspecified ?E11.9 Type 2 diabetes mellitus without complications ?Facility Procedures ?CPT4 Code: 67209470 ?Description: G0277-(Facility Use Only) HBOT full body chamber, 59mn , ICD-10 Diagnosis Description 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 C54.9 Malignant  ?neoplasm of corpus uteri, unspecified 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

## 2021-10-20 NOTE — Progress Notes (Addendum)
[name], [name] ([id number]) ?Visit Report for 10/20/2021 ?[name] Details ?Patient Name: Date of Service: ?[name], [name] [name] B. 10/20/2021 9:00 A M ?Medical Record Number: [id number] ?Patient Account Number: [id number] ?Date of Birth/Sex: Treating RN: ?1955/12/02 (66 y.o. 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Treatment Segments: 4 ?Vital Signs ?Capillary Blood Glucose Reference Range: 80 - 120 mg / dl ?[name] Diabetic Blood Glucose Intervention Range: <131 mg/dl or >249 mg/dl ?Type: Time Vitals ?Blood Pulse: Respiratory Temperature: Capillary Blood Glucose Pulse Action ?Taken: ?Pressure: ?Rate: ?Glucose (mg/dl): Meter #: Oximetry (%) Taken: ?Pre 10:24 138/48 60 18 97.8 133 ?Pre 10:33 158/64 manual BP ?Post 12:59 118/68 61 16 97.6 111 2 > 101, discharge per protocol. ?Treatment Response ?Treatment Toleration: Well ?Treatment Completion Status: Treatment Completed without Adverse Event ?Treatment Notes ?The patient complained of some ear discomfort at 14 PSI. I stopped  travel, dropped to 13 PSI, and she was able to clear. Then finished compression to 2.0 ?ATA. She had no other problems. After treatment was done, I asked her if she had any ear problems, she stated no. ?Physician [name] Attestation: ?I certify that I supervised this [name] treatment in accordance with Medicare ?guidelines. A trained emergency response team is readily available per Yes ?hospital policies and procedures. ?Continue HBOT as ordered. Yes ?Electronic Signature(s) ?Signed: 10/20/2021 6:55:47 PM By: [name] ?, , ?Signed: 10/22/2021 9:27:18 AM By: [name] ?Previous Signature: 10/20/2021 4:47:25 PM Version By: [name] ?Previous Signature: 10/20/2021 3:35:55 PM Version By: [name] EMT ?Entered By: [name] Burnet on 10/20/2021 18:55:47 ?-------------------------------------------------------------------------------- ?[name] Safety Checklist Details ?Patient Name: ?Date of Service: ?[name] [name] B. 10/20/2021 9:00 A M ?Medical Record Number: [id number] ?Patient Account Number: [id number] ?Date of Birth/Sex: ?Treating RN: ?13-Dec-1955 (66 y.o. [name], [name] ?Primary Care [name]: [name] ?Other Clinician: [name] ?Referring [name]: ?Treating [name]/Extender: [name] ?[name] ?[name]: 6 ?[name] Safety Checklist Items ?Safety Checklist ?Consent Form Signed ?Patient voided / foley secured and emptied ?When did you last eato 0845 ?Last dose of injectable or oral agent 10/17/21 Ozempic ?Ostomy pouch emptied and vented if applicable Urostomy connected to collection bag. ?All implantable devices assessed, documented and approved Silicone Breast Implants ?Intravenous access site secured and place ?NA ?Valuables secured ?Linens and cotton and cotton/polyester blend (less than 51% polyester) ?Personal oil-based products / skin lotions / body lotions removed ?Wigs or hairpieces removed ?NA ?Smoking or tobacco materials  removed ?NA ?Books / newspapers / magazines / loose paper removed ?Cologne, aftershave, perfume and deodorant removed ?Jewelry removed (may wrap wedding band) ?Make-up removed ?Hair care products removed ?Battery operated devices (external) removed ?Heating patches and chemical  warmers removed ?Titanium eyewear removed ?NA Plastic eyewear without titanium ?Nail polish cured greater than 10 hours ?NA ?Casting material cured greater than 10 hours ?NA ?Hearing aids removed ?NA ?Loose dentures or partials removed ?NA ?Prosthetics have been removed ?NA ?Patient demonstrates correct use of air break device (if applicable) ?Patient concerns have been addressed ?Patient grounding bracelet on and cord attached to chamber ?Specifics for Inpatients (complete in addition to above) ?Medication sheet sent with patient ?NA ?Intravenous medications needed or due during therapy sent with patient ?NA ?Drainage tubes (e.g. nasogastric tube or chest tube secured and vented) ?NA ?Endotracheal or Tracheotomy tube secured ?NA ?Cuff deflated of air and inflated with saline ?NA ?Airway suctioned ?NA ?Notes ?Paper version used prior to treatment. ?Electronic Signature(s) ?Signed: 10/20/2021 1:34:18 PM By: [name] ?, , ?Entered By: [name] Burnet on 10/20/2021 13:34:18 ?

## 2021-10-21 ENCOUNTER — Other Ambulatory Visit: Payer: Self-pay

## 2021-10-21 ENCOUNTER — Encounter (HOSPITAL_BASED_OUTPATIENT_CLINIC_OR_DEPARTMENT_OTHER): Payer: BC Managed Care – PPO | Admitting: General Surgery

## 2021-10-21 DIAGNOSIS — D709 Neutropenia, unspecified: Secondary | ICD-10-CM | POA: Diagnosis not present

## 2021-10-21 DIAGNOSIS — N950 Postmenopausal bleeding: Secondary | ICD-10-CM | POA: Diagnosis not present

## 2021-10-21 DIAGNOSIS — E119 Type 2 diabetes mellitus without complications: Secondary | ICD-10-CM | POA: Diagnosis not present

## 2021-10-21 DIAGNOSIS — C549 Malignant neoplasm of corpus uteri, unspecified: Secondary | ICD-10-CM | POA: Diagnosis not present

## 2021-10-21 DIAGNOSIS — N3041 Irradiation cystitis with hematuria: Secondary | ICD-10-CM | POA: Diagnosis not present

## 2021-10-21 DIAGNOSIS — Z853 Personal history of malignant neoplasm of breast: Secondary | ICD-10-CM | POA: Diagnosis not present

## 2021-10-21 DIAGNOSIS — C50212 Malignant neoplasm of upper-inner quadrant of left female breast: Secondary | ICD-10-CM | POA: Diagnosis not present

## 2021-10-21 DIAGNOSIS — Y842 Radiological procedure and radiotherapy as the cause of abnormal reaction of the patient, or of later complication, without mention of misadventure at the time of the procedure: Secondary | ICD-10-CM | POA: Diagnosis not present

## 2021-10-21 DIAGNOSIS — L59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: Secondary | ICD-10-CM | POA: Diagnosis not present

## 2021-10-21 LAB — GLUCOSE, CAPILLARY
Glucose-Capillary: 119 mg/dL — ABNORMAL HIGH (ref 70–99)
Glucose-Capillary: 152 mg/dL — ABNORMAL HIGH (ref 70–99)

## 2021-10-22 ENCOUNTER — Ambulatory Visit (INDEPENDENT_AMBULATORY_CARE_PROVIDER_SITE_OTHER): Payer: BC Managed Care – PPO | Admitting: Family Medicine

## 2021-10-22 ENCOUNTER — Encounter (HOSPITAL_BASED_OUTPATIENT_CLINIC_OR_DEPARTMENT_OTHER): Payer: BC Managed Care – PPO | Admitting: General Surgery

## 2021-10-22 DIAGNOSIS — Z853 Personal history of malignant neoplasm of breast: Secondary | ICD-10-CM | POA: Diagnosis not present

## 2021-10-22 DIAGNOSIS — D709 Neutropenia, unspecified: Secondary | ICD-10-CM | POA: Diagnosis not present

## 2021-10-22 DIAGNOSIS — N3041 Irradiation cystitis with hematuria: Secondary | ICD-10-CM | POA: Diagnosis not present

## 2021-10-22 DIAGNOSIS — Y842 Radiological procedure and radiotherapy as the cause of abnormal reaction of the patient, or of later complication, without mention of misadventure at the time of the procedure: Secondary | ICD-10-CM | POA: Diagnosis not present

## 2021-10-22 DIAGNOSIS — C50212 Malignant neoplasm of upper-inner quadrant of left female breast: Secondary | ICD-10-CM | POA: Diagnosis not present

## 2021-10-22 DIAGNOSIS — L59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: Secondary | ICD-10-CM | POA: Diagnosis not present

## 2021-10-22 DIAGNOSIS — C549 Malignant neoplasm of corpus uteri, unspecified: Secondary | ICD-10-CM | POA: Diagnosis not present

## 2021-10-22 DIAGNOSIS — E119 Type 2 diabetes mellitus without complications: Secondary | ICD-10-CM | POA: Diagnosis not present

## 2021-10-22 DIAGNOSIS — N950 Postmenopausal bleeding: Secondary | ICD-10-CM | POA: Diagnosis not present

## 2021-10-22 LAB — GLUCOSE, CAPILLARY
Glucose-Capillary: 133 mg/dL — ABNORMAL HIGH (ref 70–99)
Glucose-Capillary: 156 mg/dL — ABNORMAL HIGH (ref 70–99)
Glucose-Capillary: 179 mg/dL — ABNORMAL HIGH (ref 70–99)

## 2021-10-22 NOTE — Progress Notes (Signed)
[name], [name] ([id number]) ?Visit Report for 10/21/2021 ?[name] Details ?Patient Name: Date of Service: ?HO [name], [name] 10/21/2021 1:00 PM ?Medical Record Number: [id number] ?Patient Account Number: [id number] ?Date of Birth/Sex: Treating RN: ?Apr 28, 1956 (66 y.o. 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Treatment Segments: 5 ?Vital Signs ?Capillary Blood Glucose Reference Range: 80 - 120 mg / dl ?[name] Diabetic Blood Glucose Intervention Range: <131 mg/dl or >249 mg/dl ?Type: Time Vitals Blood ?Pulse: Respiratory Capillary Blood Glucose Pulse Action ?Temperature: ?Taken: ?Pressure: ?Rate: ?Glucose (mg/dl): ?Meter #: Oximetry (%) Taken: ?Pre 13:20 122/52 74 18 99 119 2 8 oz Glucerna given ?Post 16:22 131/59 69 18 98.2 133 2 ?Pre 13:52 152 2 Cleared for Treatment ?Treatment Response ?Treatment Toleration: Well ?Treatment Completion Status: Treatment Completed without Adverse Event ?Treatment Notes ?Diastolic blood pressure was reported to physician  prior to treatment, being asymptomatic for hypotension. After safely placing patient in chamber after ?completing safety check, patient was unable to equalize her right ear, reporting this at approximately 14.5 psi. Pressure was reversed until reaching equalization ?at approximately 11.5 psi (1414). Flow rate was increased to maximum and pressure was increased at a rate set of 1 psi/min. Patient tolerated treatment and ?decompression of the treatment with no issues. ?Physician [name] Attestation: ?I certify that I supervised this [name] treatment in accordance with Medicare ?guidelines. A trained emergency response team is readily available per Yes ?hospital policies and procedures. ?Continue HBOT as ordered. Yes ?Electronic Signature(s) ?Signed: 10/22/2021 12:33:01 PM By: [name] MD FACS ?Previous Signature: 10/21/2021 4:49:56 PM Version By: [name] MD FACS ?Entered By: [name] on 10/22/2021 12:33:01 ?-------------------------------------------------------------------------------- ?[name] Safety Checklist Details ?Patient Name: ?Date of Service: ?HO [name], [name] 10/21/2021 1:00 PM ?Medical Record Number: [id number] ?Patient Account Number: [id number] ?Date of Birth/Sex: ?Treating RN: ?10/08/1955 (66 y.o. [name], [name] ?Primary Care Brae Schaafsma: [name] ?Other Clinician: [name] ?Referring [name]: ?Treating Esraa Seres/Extender: [name] ?[name] ?[name]: 6 ?[name] Safety Checklist Items ?Safety Checklist ?Consent Form Signed ?Patient voided / foley secured and emptied ?When did you last eato 1230 ?Last dose of injectable or oral agent 10/17/2021, Ozempic ?Ostomy pouch emptied and vented if applicable Urostomy connected to collection bag. ?All implantable devices assessed, documented and approved Silicone Breast Implants ?Intravenous access site secured and place ?NA ?Valuables secured ?Linens and cotton and cotton/polyester blend (less than 51%  polyester) ?Personal oil-based products / skin lotions / body lotions removed ?Wigs or hairpieces removed ?NA ?Smoking or tobacco materials removed ?NA ?Books / newspapers / magazines / loose paper removed ?Cologne, aftershave, perfume and deodorant removed ?Jewelry removed (may wrap wedding band) ?Make-up removed ?Hair care products removed ?  Battery operated devices (external) removed ?Heating patches and chemical warmers removed ?Titanium eyewear removed ?NA Plastic frames ?Nail polish cured greater than 10 hours ?NA ?Casting material cured greater than 10 hours ?NA ?Hearing aids removed ?NA ?Loose dentures or partials removed ?NA ?Prosthetics have been removed ?NA ?Patient demonstrates correct use of air break device (if applicable) ?Patient concerns have been addressed ?Patient grounding bracelet on and cord attached to chamber ?Specifics for Inpatients (complete in addition to above) ?Medication sheet sent with patient ?NA ?Intravenous medications needed or due during therapy sent with patient ?NA ?Drainage tubes (e.g. nasogastric tube or chest tube secured and vented) ?NA ?Endotracheal or Tracheotomy tube secured ?NA ?Cuff deflated of air and inflated with saline ?NA ?Airway suctioned ?NA ?Notes ?Paper used prior to treatment. ?Electronic Signature(s) ?Signed: 10/22/2021 4:35:51 PM By: [name] CHT EMT BS ?, , ?Entered By: [name] on 10/21/2021 15:37:43 ?

## 2021-10-22 NOTE — Progress Notes (Signed)
[name], [name] ([id number]) ?Visit Report for 10/21/2021 ?Problem List Details ?Patient Name: Date of Service: ?HO [place], [name] 10/21/2021 1:00 PM ?Medical Record Number: [id number] ?Patient Account Number: [id number] ?Date of Birth/Sex: Treating RN: ?1956-07-08 (66 y.o. 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 10/21/2021 ?Medical Record Number: [id number] ?Patient Account Number: [id number] ?Date of Birth/Sex: ?Treating RN: ?1956-01-17 (67 y.o. 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, unspecified ?N95.0 Postmenopausal bleeding ?D70.9 Neutropenia, unspecified ?E11.9 Type 2 diabetes mellitus without complications ?Facility Procedures ?CPT4 Code: [id number] ?Description: G0277-(Facility Use Only) HBOT full body chamber, 26mn , ICD-10 Diagnosis Description 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, unspecified Z85.43  ?Personal history of malignant neoplasm of ovary N95.0 Postmenopausal bleeding ?Modifier: n ?Quantity: 5 ?Physician Procedures ?: CPT4 Code Description Modifier [id number]- [id number] PHYS HYPERBARIC OXYGEN THERAPY ICD-10 Diagnosis Description 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

## 2021-10-22 NOTE — Progress Notes (Signed)
DENEAN, PAVON ([id number]) ?Visit Report for 10/22/2021 ?[name] Details ?Patient Name: Date of Service: ?HO [place], [name] 10/22/2021 9:30 A M ?Medical Record Number: [id number] ?Patient Account Number: [id number] ?Date of Birth/Sex: Treating RN: ?02-Jan-1956 (66 y.o. 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Treatment Segments: 4 ?Vital Signs ?Capillary Blood Glucose Reference Range: 80 - 120 mg / dl ?[name] Diabetic Blood Glucose Intervention Range: <131 mg/dl or >249 mg/dl ?Time Vitals Blood Respiratory Capillary Blood Glucose Pulse Action ?Type: ?Pulse: Temperature: ?Taken: ?Pressure: ?Rate: ?Glucose (mg/dl): ?Meter #: Oximetry (%) Taken: ?Pre 09:27 140/53 61 18 98 156 2 ?Post 11:50 140/50 61 18 97.7 179 2 ?Treatment Response ?Treatment Toleration: Well ?Treatment Completion Status: Treatment Completed without Adverse Event ?Greig Altergott Notes ?Patient seems to tolerate compression at rate set of 1 psi/min with purge flow rate on high better. This  setting rounds out to approximately 0.85 psi/min. ?Patient's diastolic BP was below the protocol stated level of 60 mmHg. Patient was asymptomatic for hypotension. This was reported to the [name]. Patient ?tolerated decompression of chamber at 1 psi/min with purge flow rate on low. ?Physician [name] Attestation: ?I certify that I supervised this [name] treatment in accordance with Medicare ?guidelines. A trained emergency response team is readily available per Yes ?hospital policies and procedures. ?Continue HBOT as ordered. Yes ?Electronic Signature(s) ?Signed: 10/22/2021 3:18:08 PM By: [name] MD FACS ?Entered By: [name] on 10/22/2021 15:18:08 ?-------------------------------------------------------------------------------- ?[name] Safety Checklist Details ?Patient Name: ?Date of Service: ?[name], [name] 10/22/2021 9:30 A M ?Medical Record Number: [id number] ?Patient Account Number: [id number] ?Date of Birth/Sex: ?Treating RN: ?04/19/1956 (66 y.o. [name], [name] ?Primary Care Halden Phegley: [name] ?Other Clinician: [name] ?Referring Felise Georgia: ?Treating Cashus Halterman/Extender: [name] ?[name] ?[name]: 7 ?[name] Safety Checklist Items ?Safety Checklist ?Consent Form Signed ?Patient voided / foley secured and emptied ?When did you last eato 0845 ?Last dose of injectable or oral agent 10/17/21, Ozempic ?Ostomy pouch emptied and vented if applicable Urostomy connected to collection bag. ?All implantable devices assessed, documented and approved Silicone Implants ?Intravenous access site secured and place ?NA ?Valuables secured ?Linens and cotton and cotton/polyester blend (less than 51% polyester) ?Personal oil-based products / skin lotions / body lotions removed ?Wigs or hairpieces removed ?NA ?Smoking or tobacco materials removed ?Books / newspapers / magazines / loose paper removed ?Cologne, aftershave, perfume and deodorant removed ?Jewelry removed (may wrap  wedding band) ?Make-up removed ?Hair care products removed ?Battery operated devices (external) removed ?Heating patches and chemical warmers removed ?Titanium eyewear removed ?NA Frames are plastic ?Nail polish cured greater than 10 hours ?Casting material cured greater than 10 hours ?NA ?Hearing aids removed ?  NA ?Loose dentures or partials removed ?NA ?Prosthetics have been removed ?NA ?Patient demonstrates correct use of air break device (if applicable) ?Patient concerns have been addressed ?Patient grounding bracelet on and cord attached to chamber ?Specifics for Inpatients (complete in addition to above) ?Medication sheet sent with patient ?NA ?Intravenous medications needed or due during therapy sent with patient ?NA ?Drainage tubes (e.g. nasogastric tube or chest tube secured and vented) ?NA ?Endotracheal or Tracheotomy tube secured ?NA ?Cuff deflated of air and inflated with saline ?NA ?Airway suctioned ?NA ?Notes ?Paper version used prior to treatment. Data entered from form. ?Electronic Signature(s) ?Signed: 10/22/2021 4:35:51 PM By: [name] CHT EMT BS ?, , ?Entered By: [name] on 10/22/2021 10:21:07 ?

## 2021-10-22 NOTE — Progress Notes (Signed)
RUKIYA, HODGKINS ([id number]) ?Visit Report for 10/21/2021 ?Arrival Information Details ?Patient Name: Date of Service: ?HO [place], [name] 10/21/2021 1:00 PM ?Medical Record Number: [id number] ?Patient Account Number: [id number] ?Date of Birth/Sex: Treating RN: ?1956/07/04 (66 y.o. [name], [name] ?Primary Care Giomar Gusler: [name] ?Other Clinician: [name] ?Referring Milus Fritze: ?Treating Jezlyn Westerfield/Extender: [name] ?[name] ?[name]: 6 ?Visit Information History Since Last Visit ?All ordered tests and consults were completed: Yes ?Patient Arrived: Ambulatory ?Added or deleted any medications: No ?Arrival Time: 12:47 ?Any new allergies or adverse reactions: No ?Accompanied By: self ?Had a fall or experienced change in No ?Transfer Assistance: None ?activities of daily living that may affect ?Patient Identification Verified: Yes ?risk of falls: ?Secondary Verification Process Completed: Yes ?Signs or symptoms of abuse/neglect since last visito No ?Patient Requires Transmission-Based Precautions: No ?Hospitalized since last visit: No ?Patient Has Alerts: Yes ?Implantable device outside of the clinic excluding No ?Patient Alerts: Patient on Blood Thinner cellular tissue based products placed in the center ?since last visit: ?Pain Present Now: No ?Electronic Signature(s) ?Signed: 10/22/2021 4:35:51 PM By: [name] CHT EMT BS ?, , ?Entered By: [name] on 10/21/2021 14:51:00 ?-------------------------------------------------------------------------------- ?Vitals Details ?Patient Name: Date of Service: ?HO [place], [name] 10/21/2021 1:00 PM ?Medical Record Number: [id number] ?Patient Account Number: [id number] ?Date of Birth/Sex: Treating RN: ?1955/08/03 (66 y.o. [name], [name] ?Primary Care Janese Radabaugh: [name] ?Other Clinician: [name] ?Referring Shae Augello: ?Treating Rahmah Mccamy/Extender: [name] ?[name] ?Weeks in  Treatment: 6 ?Vital Signs ?Time Taken: 13:20 ?Temperature (??F): 99.0 ?Height (in): 62 ?Pulse (bpm): 74 ?Weight (lbs): 160 ?Respiratory Rate (breaths/min): 18 ?Body Mass Index (BMI): 29.3 ?Blood Pressure (mmHg): 122/52 ?Capillary Blood Glucose (mg/dl): 119 ?Reference Range: 80 - 120 mg / dl ?Notes ?Diastolic 52 mmHg, alerted physician. Patient is asymptomatic for hypotension. ?Electronic Signature(s) ?Signed: 10/22/2021 4:35:51 PM By: [name] CHT EMT BS ?, , ?Entered By: [name] on 10/21/2021 15:34:10 ?

## 2021-10-22 NOTE — Progress Notes (Signed)
[name], [name] ([id number]) ?Visit Report for 10/22/2021 ?Arrival Information Details ?Patient Name: Date of Service: ?[name] [place], [name] 10/22/2021 9:30 A M ?Medical Record Number: [id number] ?Patient Account Number: [id number] ?Date of Birth/Sex: Treating RN: ?12/28/1955 (66 y.o. [name] ?Primary Care Ashlee Bewley: [name] ?Other Clinician: [name] ?Referring Rhoderick Farrel: ?Treating Kimble Hitchens/Extender: [name] ?[name] ?[name]: 7 ?Visit Information History Since Last Visit ?All ordered tests and consults were completed: Yes ?Patient Arrived: Ambulatory ?Added or deleted any medications: No ?Arrival Time: 09:09 ?Any new allergies or adverse reactions: No ?Accompanied By: self ?Had a fall or experienced change in No ?Transfer Assistance: None ?activities of daily living that may affect ?Patient Identification Verified: Yes ?risk of falls: ?Secondary Verification Process Completed: Yes ?Signs or symptoms of abuse/neglect since last visito No ?Patient Requires Transmission-Based Precautions: No ?Hospitalized since last visit: No ?Patient Has Alerts: Yes ?Implantable device outside of the clinic excluding No ?Patient Alerts: Patient on Blood Thinner cellular tissue based products placed in the center ?since last visit: ?Pain Present Now: No ?Electronic Signature(s) ?Signed: 10/22/2021 4:35:51 PM By: [name] CHT EMT BS ?, , ?Entered By: [name] on 10/22/2021 10:15:55 ?-------------------------------------------------------------------------------- ?Encounter Discharge Information Details ?Patient Name: Date of Service: ?[name] [place], [name] 10/22/2021 9:30 A M ?Medical Record Number: [id number] ?Patient Account Number: [id number] ?Date of Birth/Sex: Treating RN: ?11-May-1956 (66 y.o. [name] ?Primary Care Laquinta Hazell: [name] ?Other Clinician: Donavan [name] ?Referring Ahjanae Cassel: ?Treating [name]/Extender: [name] ?[name] ?[name]: 7 ?Encounter Discharge Information Items ?Discharge Condition: Stable ?Ambulatory Status: Ambulatory ?Discharge Destination: Home ?Transportation: Private Auto ?Accompanied By: self ?Schedule Follow-up Appointment: No ?Clinical Summary of Care: ?Electronic Signature(s) ?Signed: 10/22/2021 4:35:51 PM By: [name] CHT EMT BS ?, , ?Entered By: [name] on 10/22/2021 12:41:23 ?-------------------------------------------------------------------------------- ?Vitals Details ?Patient Name: ?Date of Service: ?[name] [name], [name] 10/22/2021 9:30 A M ?Medical Record Number: [id number] ?Patient Account Number: [id number] ?Date of Birth/Sex: ?Treating RN: ?[birth year] (66 y.o. [name] ?Primary Care Azavion Bouillon: [name] ?Other Clinician: [name] ?Referring Luzmaria Devaux: ?Treating Lennox Dolberry/Extender: [name] ?[name] ?[name]: 7 ?Vital Signs ?Time Taken: 09:27 ?Temperature (??F): 98.0 ?Height (in): 62 ?Pulse (bpm): 61 ?Weight (lbs): 160 ?Respiratory Rate (breaths/min): 18 ?Body Mass Index (BMI): 29.3 ?Blood Pressure (mmHg): 140/53 ?Capillary Blood Glucose (mg/dl): 156 ?Reference Range: 80 - 120 mg / dl ?Electronic Signature(s) ?Signed: 10/22/2021 4:35:51 PM By: [name] CHT EMT BS ?, , ?Entered By: [name] on 10/22/2021 10:16:55 ?

## 2021-10-22 NOTE — Progress Notes (Signed)
[name], [name] ([id number]) ?Visit Report for 10/22/2021 ?Problem List Details ?Patient Name: Date of Service: ?HO [place], [name] 10/22/2021 9:30 A M ?Medical Record Number: [id number] ?Patient Account Number: [id number] ?Date of Birth/Sex: Treating RN: ?11-14-1955 (66 y.o. 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 10/22/2021 ?Medical Record Number: [id number] ?Patient Account Number: [id number] ?Date of Birth/Sex: ?Treating RN: ?Jul 12, 1956 (66 y.o. 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, unspecified ?N95.0 Postmenopausal bleeding ?D70.9 Neutropenia, unspecified ?E11.9 Type 2 diabetes mellitus without complications ?Facility Procedures ?CPT4 Code: 12878676 ?Description: G0277-(Facility Use Only) HBOT full body chamber, 63mn , ICD-10 Diagnosis Description 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 C54.9  ?Malignant neoplasm of corpus uteri, unspecified N95.0 Postmenopausal bleeding ?Modifier: n ?Quantity: 4 ?Physician Procedures ?: CPT4 Code Description Modifier [id number]- [id number] PHYS HYPERBARIC OXYGEN THERAPY ICD-10 Diagnosis Description 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

## 2021-10-23 ENCOUNTER — Encounter (HOSPITAL_BASED_OUTPATIENT_CLINIC_OR_DEPARTMENT_OTHER): Payer: BC Managed Care – PPO | Admitting: General Surgery

## 2021-10-23 ENCOUNTER — Other Ambulatory Visit: Payer: Self-pay

## 2021-10-23 DIAGNOSIS — L59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: Secondary | ICD-10-CM | POA: Diagnosis not present

## 2021-10-23 DIAGNOSIS — Y842 Radiological procedure and radiotherapy as the cause of abnormal reaction of the patient, or of later complication, without mention of misadventure at the time of the procedure: Secondary | ICD-10-CM | POA: Diagnosis not present

## 2021-10-23 DIAGNOSIS — C50212 Malignant neoplasm of upper-inner quadrant of left female breast: Secondary | ICD-10-CM | POA: Diagnosis not present

## 2021-10-23 DIAGNOSIS — D709 Neutropenia, unspecified: Secondary | ICD-10-CM | POA: Diagnosis not present

## 2021-10-23 DIAGNOSIS — Z853 Personal history of malignant neoplasm of breast: Secondary | ICD-10-CM | POA: Diagnosis not present

## 2021-10-23 DIAGNOSIS — C549 Malignant neoplasm of corpus uteri, unspecified: Secondary | ICD-10-CM | POA: Diagnosis not present

## 2021-10-23 DIAGNOSIS — N950 Postmenopausal bleeding: Secondary | ICD-10-CM | POA: Diagnosis not present

## 2021-10-23 DIAGNOSIS — E119 Type 2 diabetes mellitus without complications: Secondary | ICD-10-CM | POA: Diagnosis not present

## 2021-10-23 DIAGNOSIS — N3041 Irradiation cystitis with hematuria: Secondary | ICD-10-CM | POA: Diagnosis not present

## 2021-10-23 LAB — GLUCOSE, CAPILLARY
Glucose-Capillary: 87 mg/dL (ref 70–99)
Glucose-Capillary: 100 mg/dL — ABNORMAL HIGH (ref 70–99)
Glucose-Capillary: 110 mg/dL — ABNORMAL HIGH (ref 70–99)

## 2021-10-23 NOTE — Progress Notes (Addendum)
[name], [name] ([id number]) ?Visit Report for 10/23/2021 ?Arrival Information Details ?Patient Name: Date of Service: ?[name], [name] 10/23/2021 1:00 PM ?Medical Record Number: [id number] ?Patient Account Number: [id number] ?Date of Birth/Sex: Treating RN: ?01-28-1956 (66 y.o. [name], [name] ?Primary Care [name]: [name] ?Other Clinician: Donavan [name] ?Referring [name]: ?Treating [name]/Extender: [name] ?[name] ?[name]: 7 ?Visit Information History Since Last Visit ?All ordered tests and consults were completed: Yes ?Patient Arrived: Ambulatory ?Added or deleted any medications: No ?Arrival Time: 13:00 ?Any new allergies or adverse reactions: No ?Accompanied By: self ?Had a fall or experienced change in No ?Transfer Assistance: None ?activities of daily living that may affect ?Patient Identification Verified: Yes ?risk of falls: ?Secondary Verification Process Completed: Yes ?Signs or symptoms of abuse/neglect since last visito No ?Patient Requires Transmission-Based Precautions: No ?Hospitalized since last visit: No ?Patient Has Alerts: Yes ?Implantable device outside of the clinic excluding No ?Patient Alerts: Patient on Blood Thinner cellular tissue based products placed in the center ?since last visit: ?Pain Present Now: No ?Electronic Signature(s) ?Signed: 10/23/2021 3:22:50 PM By: [name] CHT EMT BS ?, , ?Entered By: [name] on 10/23/2021 15:22:50 ?-------------------------------------------------------------------------------- ?Vitals Details ?Patient Name: Date of Service: ?[name], [name] 10/23/2021 1:00 PM ?Medical Record Number: [id number] ?Patient Account Number: [id number] ?Date of Birth/Sex: Treating RN: ?1956-07-20 (66 y.o. F) ?Primary Care [name]: [name] Other Clinician: ?Referring [name]: ?Treating [name]/Extender: [name] ?[name] ?[name]: 7 ?Vital Signs ?Time Taken:  12:55 ?Temperature (??F): 97.7 ?Height (in): 62 ?Pulse (bpm): 61 ?Weight (lbs): 160 ?Respiratory Rate (breaths/min): 18 ?Body Mass Index (BMI): 29.3 ?Blood Pressure (mmHg): 135/58 ?Capillary Blood Glucose (mg/dl): 87 ?Reference Range: 80 - 120 mg / dl ?Notes ?Given 8 oz Glucerna at 1256. After 30 minutes blood glucose was re-checked at 100 mg/dL at 1300. Physician was informed of blood glucose. Patient was ?given an additional Glucerna at 1338. Blood glucose was re-checked at 1401, measuring 110 mg/dL. Treatment was rescheduled for 10/26/21. ?Electronic Signature(s) ?Signed: 10/23/2021 3:33:55 PM By: [name] CHT EMT BS ?, , ?Previous Signature: 10/23/2021 3:28:08 PM Version By: [name] CHT EMT BS ?, , ?Entered By: [name] on 10/23/2021 15:33:55 ?

## 2021-10-26 ENCOUNTER — Other Ambulatory Visit: Payer: Self-pay

## 2021-10-26 ENCOUNTER — Encounter (HOSPITAL_BASED_OUTPATIENT_CLINIC_OR_DEPARTMENT_OTHER): Payer: BC Managed Care – PPO | Admitting: Internal Medicine

## 2021-10-26 DIAGNOSIS — Z853 Personal history of malignant neoplasm of breast: Secondary | ICD-10-CM | POA: Diagnosis not present

## 2021-10-26 DIAGNOSIS — Z8543 Personal history of malignant neoplasm of ovary: Secondary | ICD-10-CM

## 2021-10-26 DIAGNOSIS — C50212 Malignant neoplasm of upper-inner quadrant of left female breast: Secondary | ICD-10-CM | POA: Diagnosis not present

## 2021-10-26 DIAGNOSIS — E119 Type 2 diabetes mellitus without complications: Secondary | ICD-10-CM | POA: Diagnosis not present

## 2021-10-26 DIAGNOSIS — Y842 Radiological procedure and radiotherapy as the cause of abnormal reaction of the patient, or of later complication, without mention of misadventure at the time of the procedure: Secondary | ICD-10-CM | POA: Diagnosis not present

## 2021-10-26 DIAGNOSIS — L59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: Secondary | ICD-10-CM | POA: Diagnosis not present

## 2021-10-26 DIAGNOSIS — N3041 Irradiation cystitis with hematuria: Secondary | ICD-10-CM | POA: Diagnosis not present

## 2021-10-26 DIAGNOSIS — N950 Postmenopausal bleeding: Secondary | ICD-10-CM

## 2021-10-26 DIAGNOSIS — C549 Malignant neoplasm of corpus uteri, unspecified: Secondary | ICD-10-CM | POA: Diagnosis not present

## 2021-10-26 DIAGNOSIS — D709 Neutropenia, unspecified: Secondary | ICD-10-CM | POA: Diagnosis not present

## 2021-10-26 LAB — GLUCOSE, CAPILLARY
Glucose-Capillary: 149 mg/dL — ABNORMAL HIGH (ref 70–99)
Glucose-Capillary: 139 mg/dL — ABNORMAL HIGH (ref 70–99)

## 2021-10-26 NOTE — Progress Notes (Signed)
[name], [name] ([id number]) ?Visit Report for 10/23/2021 ?SuperBill Details ?Patient Name: Date of Service: ?[name], [name] 10/23/2021 ?Medical Record Number: [id number] ?Patient Account Number: [id number] ?Date of Birth/Sex: Treating RN: ?[birth year] (66 y.o. 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

## 2021-10-27 ENCOUNTER — Encounter (HOSPITAL_BASED_OUTPATIENT_CLINIC_OR_DEPARTMENT_OTHER): Payer: BC Managed Care – PPO | Admitting: Internal Medicine

## 2021-10-27 DIAGNOSIS — L59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: Secondary | ICD-10-CM

## 2021-10-27 DIAGNOSIS — C50212 Malignant neoplasm of upper-inner quadrant of left female breast: Secondary | ICD-10-CM | POA: Diagnosis not present

## 2021-10-27 DIAGNOSIS — Z8543 Personal history of malignant neoplasm of ovary: Secondary | ICD-10-CM | POA: Diagnosis not present

## 2021-10-27 DIAGNOSIS — Z853 Personal history of malignant neoplasm of breast: Secondary | ICD-10-CM | POA: Diagnosis not present

## 2021-10-27 DIAGNOSIS — C549 Malignant neoplasm of corpus uteri, unspecified: Secondary | ICD-10-CM

## 2021-10-27 DIAGNOSIS — Y842 Radiological procedure and radiotherapy as the cause of abnormal reaction of the patient, or of later complication, without mention of misadventure at the time of the procedure: Secondary | ICD-10-CM | POA: Diagnosis not present

## 2021-10-27 DIAGNOSIS — N950 Postmenopausal bleeding: Secondary | ICD-10-CM

## 2021-10-27 DIAGNOSIS — N3041 Irradiation cystitis with hematuria: Secondary | ICD-10-CM | POA: Diagnosis not present

## 2021-10-27 DIAGNOSIS — E119 Type 2 diabetes mellitus without complications: Secondary | ICD-10-CM | POA: Diagnosis not present

## 2021-10-27 DIAGNOSIS — D709 Neutropenia, unspecified: Secondary | ICD-10-CM | POA: Diagnosis not present

## 2021-10-27 LAB — GLUCOSE, CAPILLARY
Glucose-Capillary: 142 mg/dL — ABNORMAL HIGH (ref 70–99)
Glucose-Capillary: 111 mg/dL — ABNORMAL HIGH (ref 70–99)

## 2021-10-27 NOTE — Progress Notes (Addendum)
EMMANUELA, GHAZI ([id number]) ?Visit Report for 10/27/2021 ?Arrival Information Details ?Patient Name: Date of Service: ?HO [place], [name] 10/27/2021 10:00 A M ?Medical Record Number: [id number] ?Patient Account Number: [id number] ?Date of Birth/Sex: Treating RN: ?1956-05-02 (66 y.o. [name], [name] ?Primary Care Mireille Lacombe: [name] ?Other Clinician: [name] ?Referring Rakia Frayne: ?Treating Sirinity Outland/Extender: [name] ?[name] ?[name]: 7 ?Visit Information History Since Last Visit ?All ordered tests and consults were completed: Yes ?Patient Arrived: Ambulatory ?Added or deleted any medications: No ?Arrival Time: 09:38 ?Any new allergies or adverse reactions: No ?Accompanied By: self ?Had a fall or experienced change in No ?Transfer Assistance: None ?activities of daily living that may affect ?Patient Identification Verified: Yes ?risk of falls: ?Secondary Verification Process Completed: Yes ?Signs or symptoms of abuse/neglect since last visito No ?Patient Requires Transmission-Based Precautions: No ?Hospitalized since last visit: No ?Patient Has Alerts: Yes ?Implantable device outside of the clinic excluding No ?Patient Alerts: Patient on Blood Thinner cellular tissue based products placed in the center ?since last visit: ?Pain Present Now: No ?Electronic Signature(s) ?Signed: 10/27/2021 11:26:48 AM By: [name] CHT EMT BS ?, , ?Entered By: [name] on 10/27/2021 11:22:08 ?-------------------------------------------------------------------------------- ?Encounter Discharge Information Details ?Patient Name: Date of Service: ?HO [employer], [name] 10/27/2021 10:00 A M ?Medical Record Number: [id number] ?Patient Account Number: [id number] ?Date of Birth/Sex: Treating RN: ?11-Apr-1956 (66 y.o. [name], [name] ?Primary Care Shereece Wellborn: [name] ?Other Clinician: [name] ?Referring Nohelia Valenza: ?Treating Tyesha Joffe/Extender: [name] ?[name] ?[name]: 7 ?Encounter Discharge Information Items ?Discharge Condition: Stable ?Ambulatory Status: Ambulatory ?Discharge Destination: Home ?Transportation: Private Auto ?Accompanied By: self ?Schedule Follow-up Appointment: No ?Clinical Summary of Care: ?Electronic Signature(s) ?Signed: 10/27/2021 1:21:57 PM By: [name] CHT EMT BS ?, , ?Entered By: [name] on 10/27/2021 13:15:43 ?-------------------------------------------------------------------------------- ?Vitals Details ?Patient Name: ?Date of Service: ?[name], [name] 10/27/2021 10:00 A M ?Medical Record Number: [id number] ?Patient Account Number: [id number] ?Date of Birth/Sex: ?Treating RN: ?1955-08-19 (66 y.o. [name], [name] ?Primary Care Merial Moritz: [name] ?Other Clinician: [name] ?Referring Charm Stenner: ?Treating Kimberlea Schlag/Extender: [name] ?[name] ?[name]: 7 ?Vital Signs ?Time Taken: 10:05 ?Temperature (??F): 98.5 ?Height (in): 62 ?Pulse (bpm): 72 ?Weight (lbs): 160 ?Respiratory Rate (breaths/min): 20 ?Body Mass Index (BMI): 29.3 ?Blood Pressure (mmHg): 125/64 ?Capillary Blood Glucose (mg/dl): 142 ?Reference Range: 80 - 120 mg / dl ?Electronic Signature(s) ?Signed: 10/27/2021 11:26:48 AM By: [name] CHT EMT BS ?, , ?Entered By: [name] on 10/27/2021 11:22:32 ?

## 2021-10-27 NOTE — Progress Notes (Signed)
[name], [name] ([id number]) ?Visit Report for 10/26/2021 ?[name] Details ?Patient Name: Date of Service: ?[name] [place], [name] 10/26/2021 10:00 A M ?Medical Record Number: [id number] ?Patient Account Number: [id number] ?Date of Birth/Sex: Treating RN: ?03/26/1956 (66 y.o. 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Treatment Segments: 4 ?Vital Signs ?Capillary Blood Glucose Reference Range: 80 - 120 mg / dl ?[name] Diabetic Blood Glucose Intervention Range: <131 mg/dl or >249 mg/dl ?Time Vitals Blood Respiratory Capillary Blood Glucose Pulse Action ?Type: ?Pulse: Temperature: ?Taken: ?Pressure: ?Rate: ?Glucose (mg/dl): ?Meter #: Oximetry (%) Taken: ?Pre 10:03 140/62 65 18 97.6 149 2 ?Post 12:30 131/79 62 18 97.7 139 2 ?Treatment Response ?Treatment Toleration: Well ?Treatment Completion Status: Treatment Completed without Adverse Event ?Treatment Notes ?Patient tolerated treatment with no issues. ?Physician [name] Attestation: ?I certify that I supervised this  [name] treatment in accordance with Medicare ?guidelines. A trained emergency response team is readily available per Yes ?hospital policies and procedures. ?Continue HBOT as ordered. Yes ?Electronic Signature(s) ?Signed: 10/27/2021 9:23:40 AM By: [name] DO ?Entered By: [name] on 10/27/2021 09:05:52 ?-------------------------------------------------------------------------------- ?[name] Safety Checklist Details ?Patient Name: ?Date of Service: ?[name] [name], [name] [name] 10/26/2021 10:00 A M ?Medical Record Number: [id number] ?Patient Account Number: [id number] ?Date of Birth/Sex: ?Treating RN: ?11-Mar-1956 (66 y.o. [name], [name] ?Primary Care Delrae Hagey: [name] ?Other Clinician: [name] ?Referring Annabella Elford: ?Treating Terrance Usery/Extender: [name] ?[name] ?[name]: 7 ?[name] Safety Checklist Items ?Safety Checklist ?Consent Form Signed ?Patient voided / foley secured and emptied ?When did you last eato 0830 ?Last dose of injectable or oral agent Saturday Mounjaro 7 mg ?Ostomy pouch emptied and vented if applicable Urostomy attached to collection bag. ?All implantable devices assessed, documented and approved Silicone Implants ?Intravenous access site secured and place ?NA ?Valuables secured ?Linens and cotton and cotton/polyester blend (less than 51% polyester) ?Personal oil-based products / skin lotions / body lotions removed ?Wigs or hairpieces removed ?NA ?Smoking or tobacco materials removed ?NA ?Books / newspapers / magazines / loose paper removed ?Cologne, aftershave, perfume and deodorant removed ?Jewelry removed (may wrap wedding band) ?Make-up removed ?Hair care products removed ?Battery operated devices (external) removed ?Heating patches and chemical warmers removed ?NA ?Titanium eyewear removed ?NA Plastic frames ?Nail polish cured greater than 10 hours ?NA ?Casting material cured greater than 10 hours ?NA ?Hearing aids removed ?NA ?Loose dentures or  partials removed ?NA ?Prosthetics have been removed ?NA ?Patient demonstrates correct use of air break device (if applicable) ?Patient concerns have been addressed ?Patient grounding bracelet on and cord attached to chamber ?Specifics for Inpatients (complete in addition to above) ?Medication sheet sent with patient ?NA ?Intravenous medications needed or  due during therapy sent with patient ?NA ?Drainage tubes (e.g. nasogastric tube or chest tube secured and vented) ?NA ?Endotracheal or Tracheotomy tube secured ?NA ?Cuff deflated of air and inflated with saline ?NA ?Airway suctioned ?NA ?Notes ?Paper version used prior to treatment. ?Electronic Signature(s) ?Signed: 10/27/2021 11:26:48 AM By: [name] CHT EMT BS ?, , ?Entered By: [name] on 10/26/2021 11:11:22 ?

## 2021-10-27 NOTE — Progress Notes (Signed)
[name], [name] ([id number]) ?Visit Report for 10/26/2021 ?SuperBill Details ?Patient Name: Date of Service: ?[name], [name] 10/26/2021 ?Medical Record Number: [id number] ?Patient Account Number: [id number] ?Date of Birth/Sex: Treating RN: ?1956-03-11 (66 y.o. 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CPT4 Code Description Modifier ?[id number]- WC PHYS HYPERBARIC OXYGEN THERAPY 1 ?ICD-10 Diagnosis Description ?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

## 2021-10-27 NOTE — Progress Notes (Signed)
BERNARDETTE, WALDRON ([id number]) ?Visit Report for 10/26/2021 ?Arrival Information Details ?Patient Name: Date of Service: ?HO [place], [name] 10/26/2021 10:00 A M ?Medical Record Number: [id number] ?Patient Account Number: [id number] ?Date of Birth/Sex: Treating RN: ?Jan 07, 1956 (66 y.o. [name], [name] ?Primary Care Wylodean Shimmel: [name] ?Other Clinician: [name] ?Referring Zoee Heeney: ?Treating Farhad Burleson/Extender: [name] ?[name] ?[name]: 7 ?Visit Information History Since Last Visit ?All ordered tests and consults were completed: Yes ?Patient Arrived: Ambulatory ?Added or deleted any medications: Yes ?Arrival Time: 09:48 ?Any new allergies or adverse reactions: No ?Accompanied By: self ?Had a fall or experienced change in No ?Transfer Assistance: None ?activities of daily living that may affect ?Patient Identification Verified: Yes ?risk of falls: ?Secondary Verification Process Completed: Yes ?Signs or symptoms of abuse/neglect since last visito No ?Patient Requires Transmission-Based Precautions: No ?Hospitalized since last visit: No ?Patient Has Alerts: Yes ?Implantable device outside of the clinic excluding No ?Patient Alerts: Patient on Blood Thinner cellular tissue based products placed in the center ?since last visit: ?Pain Present Now: No ?Electronic Signature(s) ?Signed: 10/27/2021 11:26:48 AM By: [name] CHT EMT BS ?, , ?Entered By: [name] on 10/26/2021 14:06:22 ?-------------------------------------------------------------------------------- ?Encounter Discharge Information Details ?Patient Name: Date of Service: ?HO [place], [name] 10/26/2021 10:00 A M ?Medical Record Number: [id number] ?Patient Account Number: [id number] ?Date of Birth/Sex: Treating RN: ?10/25/1955 (66 y.o. [name], [name] ?Primary Care Nelle Sayed: [name] ?Other Clinician: [name] ?Referring Rohit Deloria: ?Treating Savier Trickett/Extender: [name] ?[name] ?[name]: 7 ?Encounter Discharge Information Items ?Discharge Condition: Stable ?Ambulatory Status: Ambulatory ?Discharge Destination: Home ?Transportation: Private Auto ?Accompanied By: self ?Schedule Follow-up Appointment: No ?Clinical Summary of Care: ?Electronic Signature(s) ?Signed: 10/27/2021 11:26:48 AM By: [name] CHT EMT BS ?, , ?Entered By: [name] on 10/26/2021 14:05:18 ?-------------------------------------------------------------------------------- ?Vitals Details ?Patient Name: ?Date of Service: ?[name], [name] 10/26/2021 10:00 A M ?Medical Record Number: [id number] ?Patient Account Number: [id number] ?Date of Birth/Sex: ?Treating RN: ?22-May-1956 (65 y.o. [name], [name] ?Primary Care Nechama Escutia: [name] ?Other Clinician: [name] ?Referring Tabby Beaston: ?Treating Eliakim Tendler/Extender: [name] ?[name] ?[name]: 7 ?Vital Signs ?Time Taken: 10:03 ?Temperature (??F): 97.6 ?Height (in): 62 ?Pulse (bpm): 65 ?Weight (lbs): 160 ?Respiratory Rate (breaths/min): 18 ?Body Mass Index (BMI): 29.3 ?Blood Pressure (mmHg): 140/62 ?Capillary Blood Glucose (mg/dl): 149 ?Reference Range: 80 - 120 mg / dl ?Electronic Signature(s) ?Signed: 10/27/2021 11:26:48 AM By: [name] CHT EMT BS ?, , ?Entered By: [name] on 10/26/2021 10:58:53 ?

## 2021-10-27 NOTE — Progress Notes (Signed)
[name], [name] ([id number]) ?Visit Report for 10/27/2021 ?[name] Details ?Patient Name: Date of Service: ?HO [place], [name] 10/27/2021 10:00 A M ?Medical Record Number: [id number] ?Patient Account Number: [id number] ?Date of Birth/Sex: Treating RN: ?08/09/1955 (66 y.o. 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Treatment Segments: 4 ?Vital Signs ?Capillary Blood Glucose Reference Range: 80 - 120 mg / dl ?[name] Diabetic Blood Glucose Intervention Range: <131 mg/dl or >249 mg/dl ?Type: Time Vitals Blood Pulse: Respiratory Temperature: Capillary Blood Glucose Pulse Action ?Taken: ?Pressure: ?Rate: ?Glucose (mg/dl): Meter #: Oximetry (%) Taken: ?Pre 10:05 125/64 72 20 98.5 142 ?Post 12:26 137/62 66 18 97.8 111 > 101 mg/dL, discharge per protocol ?Treatment Response ?Treatment Toleration: Well ?Treatment Completion Status: Treatment Completed without Adverse Event ?Physician [name] Attestation: ?I certify that I supervised this [name] treatment in accordance with  Medicare ?guidelines. A trained emergency response team is readily available per Yes ?hospital policies and procedures. ?Continue HBOT as ordered. Yes ?Electronic Signature(s) ?Signed: 10/27/2021 4:28:29 PM By: [name] Shan DO ?Previous Signature: 10/27/2021 1:21:57 PM Version By: [name] CHT EMT BS ?, , ?Entered By: [name] on 10/27/2021 16:21:14 ?-------------------------------------------------------------------------------- ?[name] Safety Checklist Details ?Patient Name: ?Date of Service: ?[name], [name] 10/27/2021 10:00 A M ?Medical Record Number: [id number] ?Patient Account Number: [id number] ?Date of Birth/Sex: ?Treating RN: ?06/27/1956 (66 y.o. [name], [name] ?Primary Care Colbi Schiltz: [name] ?Other Clinician: Donavan [name] ?Referring Sakai Heinle: ?Treating Ponciano Shealy/Extender: [name] ?[name] ?[name]: 7 ?[name] Safety Checklist Items ?Safety Checklist ?Consent Form Signed ?Patient voided / foley secured and emptied ?When did you last eato 0830 ?Last dose of injectable or oral agent 10/24/2021 Mounjaro 7 mg ?Ostomy pouch emptied and vented if applicable Urostomy connected to collection bag ?All implantable devices assessed, documented and approved Silicone Implants ?Intravenous access site secured and place ?NA ?Valuables secured ?Linens and cotton and cotton/polyester blend (less than 51% polyester) ?Personal oil-based products / skin lotions / body lotions removed ?Wigs or hairpieces removed ?NA ?Smoking or tobacco materials removed ?NA ?Books / newspapers / magazines / loose paper removed ?Cologne, aftershave, perfume and deodorant removed ?Jewelry removed (may wrap wedding band) ?Make-up removed ?Hair care products removed ?Battery operated devices (external) removed ?Heating patches and chemical warmers removed ?NA ?Titanium eyewear removed ?NA Plastic frames ?Nail polish cured greater than 10 hours ?NA ?Casting material cured greater than 10  hours ?NA ?Hearing aids removed ?NA ?Loose dentures or partials removed ?NA ?Prosthetics have been removed ?NA ?Patient demonstrates correct use of air break device (if applicable) ?Patient concerns have been addressed ?Patient grounding bracelet on and cord attached to chamber ?Specifics for Inpatients (complete in addition to above) ?  Medication sheet sent with patient ?NA ?Intravenous medications needed or due during therapy sent with patient ?NA ?Drainage tubes (e.g. nasogastric tube or chest tube secured and vented) ?NA ?Endotracheal or Tracheotomy tube secured ?NA ?Cuff deflated of air and inflated with saline ?NA ?Airway suctioned ?NA ?Notes ?Paper version used prior to treatment. ?Electronic Signature(s) ?Signed: 10/27/2021 11:26:48 AM By: [name] CHT EMT BS ?, , ?Entered By: [name] on 10/27/2021 11:25:37 ?

## 2021-10-27 NOTE — Progress Notes (Signed)
[name], [name] ([id number]) ?Visit Report for 10/27/2021 ?SuperBill Details ?Patient Name: Date of Service: ?[name], [name] 10/27/2021 ?Medical Record Number: [id number] ?Patient Account Number: [id number] ?Date of Birth/Sex: Treating RN: ?15-Aug-1955 (65 y.o. 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CPT4 Code Description Modifier ?[id number]- WC PHYS HYPERBARIC OXYGEN THERAPY 1 ?ICD-10 Diagnosis Description ?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

## 2021-10-28 ENCOUNTER — Other Ambulatory Visit: Payer: Self-pay

## 2021-10-28 ENCOUNTER — Encounter (HOSPITAL_BASED_OUTPATIENT_CLINIC_OR_DEPARTMENT_OTHER): Payer: BC Managed Care – PPO | Admitting: General Surgery

## 2021-10-28 DIAGNOSIS — C50212 Malignant neoplasm of upper-inner quadrant of left female breast: Secondary | ICD-10-CM | POA: Diagnosis not present

## 2021-10-28 DIAGNOSIS — L59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: Secondary | ICD-10-CM | POA: Diagnosis not present

## 2021-10-28 DIAGNOSIS — Y842 Radiological procedure and radiotherapy as the cause of abnormal reaction of the patient, or of later complication, without mention of misadventure at the time of the procedure: Secondary | ICD-10-CM | POA: Diagnosis not present

## 2021-10-28 DIAGNOSIS — N3041 Irradiation cystitis with hematuria: Secondary | ICD-10-CM | POA: Diagnosis not present

## 2021-10-28 DIAGNOSIS — N950 Postmenopausal bleeding: Secondary | ICD-10-CM | POA: Diagnosis not present

## 2021-10-28 DIAGNOSIS — E119 Type 2 diabetes mellitus without complications: Secondary | ICD-10-CM | POA: Diagnosis not present

## 2021-10-28 DIAGNOSIS — Z853 Personal history of malignant neoplasm of breast: Secondary | ICD-10-CM | POA: Diagnosis not present

## 2021-10-28 DIAGNOSIS — D709 Neutropenia, unspecified: Secondary | ICD-10-CM | POA: Diagnosis not present

## 2021-10-28 DIAGNOSIS — C549 Malignant neoplasm of corpus uteri, unspecified: Secondary | ICD-10-CM | POA: Diagnosis not present

## 2021-10-28 LAB — GLUCOSE, CAPILLARY
Glucose-Capillary: 147 mg/dL — ABNORMAL HIGH (ref 70–99)
Glucose-Capillary: 128 mg/dL — ABNORMAL HIGH (ref 70–99)

## 2021-10-28 NOTE — Progress Notes (Addendum)
[name], [name] ([id number]) ?Visit Report for 10/28/2021 ?[name] Details ?Patient Name: Date of Service: ?[name] [place], [name] 10/28/2021 10:00 A M ?Medical Record Number: [id number] ?Patient Account Number: [id number] ?Date of Birth/Sex: Treating RN: ?April 13, 1956 (66 y.o. 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Treatment Segments: 4 ?Vital Signs ?Capillary Blood Glucose Reference Range: 80 - 120 mg / dl ?[name] Diabetic Blood Glucose Intervention Range: <131 mg/dl or >249 mg/dl ?Time Vitals Blood Respiratory Capillary Blood Glucose Pulse Action ?Type: ?Pulse: Temperature: ?Taken: ?Pressure: ?Rate: ?Glucose (mg/dl): ?Meter #: Oximetry (%) Taken: ?Pre 09:59 138/65 66 16 98.1 147 ?Post 12:29 135/93 62 16 98.1 128 ?Treatment Response ?Treatment Toleration: Well ?Treatment Completion Status: Treatment Completed without Adverse Event ?Treatment Notes ?The patient stated that her left sinus was running today. Stated that she felt like she should "go slow". She had  used Afrin at home, before coming in today. ?Travel rate at 1.0 PSI/min. The patient was able to clear during compression and had no problems today. After treatment, I asked her if she had any problems ?with her ears or sinus. She stated no. ?Physician [name] Attestation: ?I certify that I supervised this [name] treatment in accordance with Medicare ?guidelines. A trained emergency response team is readily available per Yes ?hospital policies and procedures. ?Continue HBOT as ordered. Yes ?Electronic Signature(s) ?Signed: 10/28/2021 3:16:41 PM By: [name] MD FACS ?Previous Signature: 10/28/2021 1:19:45 PM Version By: [name] EMT ?Previous Signature: 10/28/2021 1:18:19 PM Version By: [name] EMT ?Entered By: [name] on 10/28/2021 15:16:41 ?-------------------------------------------------------------------------------- ?[name] Safety Checklist Details ?Patient Name: ?Date of Service: ?[name] [name], [name] [name] B. 10/28/2021 10:00 A M ?Medical Record Number: [id number] ?Patient Account Number: [id number] ?Date of Birth/Sex: ?Treating RN: ?[birth year] (66 y.o. F) [name], [name] ?Primary Care Ramil Edgington: [name] ?Other Clinician: [name] ?Referring Osby Sweetin: ?Treating Hancel Ion/Extender: [name] ?[name] ?[name]: 7 ?[name] Safety Checklist Items ?Safety Checklist ?Consent Form Signed ?Patient voided / foley secured and emptied ?When did you last eato 0800 ?Last dose of injectable or oral agent Saturday 10/24/2021 ?Ostomy pouch emptied and vented if applicable Urostomy connected to collection bag ?All implantable devices assessed, documented and approved Silicone Implants ?Intravenous access site secured and place ?NA ?Valuables secured ?Linens and cotton and cotton/polyester blend (less than 51% polyester) ?Personal oil-based products / skin lotions / body lotions removed ?Wigs or hairpieces removed ?NA ?Smoking or tobacco materials removed ?Books / newspapers / magazines  / loose paper removed ?Cologne, aftershave, perfume and deodorant removed ?Jewelry removed (may wrap wedding band) ?NA ?Make-up removed ?Hair care products removed ?Battery operated devices (external) removed ?Heating patches and chemical warmers removed ?Titanium eyewear removed ?NA  Plastic frames ?Nail polish cured greater than 10 hours ?NA ?Casting material cured greater than 10 hours ?NA ?Hearing aids removed ?NA ?Loose dentures or partials removed ?NA ?Prosthetics have been removed ?NA ?Patient demonstrates correct use of air break device (if applicable) ?Patient concerns have been addressed ?Patient grounding bracelet on and cord attached to chamber ?Specifics for Inpatients (complete in addition to above) ?Medication sheet sent with patient ?NA ?Intravenous medications needed or due during therapy sent with patient ?NA ?Drainage tubes (e.g. nasogastric tube or chest tube secured and vented) ?NA ?Endotracheal or Tracheotomy tube secured ?NA ?Cuff deflated of air and inflated with saline ?NA ?Airway suctioned ?NA ?Notes ?Paper version was used prior to the treatment. The checklist was done before treatment started. ?Electronic Signature(s) ?Signed: 10/28/2021 12:03:14 PM By: [name] EMT ?Entered By: [name] on 10/28/2021 12:03:14 ?

## 2021-10-28 NOTE — Progress Notes (Addendum)
[name], [name] ([id number]) ?Visit Report for 10/28/2021 ?Arrival Information Details ?Patient Name: Date of Service: ?HO [place], [name] 10/28/2021 10:00 A M ?Medical Record Number: [id number] ?Patient Account Number: [id number] ?Date of Birth/Sex: Treating RN: ?Apr 12, 1956 (66 y.o. F) [name], [name] ?Primary Care [name]: [name] ?Other Clinician: [name] ?Referring [name]: ?Treating [name]/Extender: [name] ?[name] ?[name]: 7 ?Visit Information History Since Last Visit ?All ordered tests and consults were completed: Yes ?Patient Arrived: Ambulatory ?Added or deleted any medications: No ?Arrival Time: 09:40 ?Any new allergies or adverse reactions: No ?Accompanied By: None ?Had a fall or experienced change in No ?Transfer Assistance: None ?activities of daily living that may affect ?Patient Identification Verified: Yes ?risk of falls: ?Secondary Verification Process Completed: Yes ?Signs or symptoms of abuse/neglect since last visito No ?Patient Requires Transmission-Based Precautions: No ?Hospitalized since last visit: No ?Patient Has Alerts: Yes ?Implantable device outside of the clinic excluding No ?Patient Alerts: Patient on Blood Thinner cellular tissue based products placed in the center ?since last visit: ?Pain Present Now: No ?Notes ?Paper version was used prior to the treatment. ?Electronic Signature(s) ?Signed: 10/28/2021 11:48:50 AM By: [name] EMT ?Entered By: [name] on 10/28/2021 11:48:50 ?-------------------------------------------------------------------------------- ?Encounter Discharge Information Details ?Patient Name: Date of Service: ?HO [place], [name] 10/28/2021 10:00 A M ?Medical Record Number: [id number] ?Patient Account Number: [id number] ?Date of Birth/Sex: Treating RN: ?[birth year] (66 y.o. F) [name], [name] ?Primary Care [name]: [name] ?Other Clinician: [name] ?Referring [name]: ?Treating  [name]/Extender: [name] ?[name] ?[name]: 7 ?Encounter Discharge Information Items ?Discharge Condition: Stable ?Ambulatory Status: Ambulatory ?Discharge Destination: Home ?Transportation: Private Auto ?Accompanied By: None ?Schedule Follow-up Appointment: Yes ?Clinical Summary of Care: ?Electronic Signature(s) ?Signed: 10/28/2021 1:21:17 PM By: [name] EMT ?Entered By: [name] on 10/28/2021 13:21:17 ?-------------------------------------------------------------------------------- ?Vitals Details ?Patient Name: ?Date of Service: ?[name], [name] 10/28/2021 10:00 A M ?Medical Record Number: [id number] ?Patient Account Number: [id number] ?Date of Birth/Sex: ?Treating RN: ?31-Jan-1956 (66 y.o. F) ?Primary Care [name]: [name] ?Other Clinician: ?Referring [name]: ?Treating [name]/Extender: [name] ?[name] ?[name]: 7 ?Vital Signs ?Time Taken: 09:59 ?Temperature (??F): 98.1 ?Height (in): 62 ?Pulse (bpm): 66 ?Weight (lbs): 160 ?Respiratory Rate (breaths/min): 16 ?Body Mass Index (BMI): 29.3 ?Blood Pressure (mmHg): 138/65 ?Capillary Blood Glucose (mg/dl): 147 ?Reference Range: 80 - 120 mg / dl ?Notes ?Paper version was used prior to the treatment. ?Electronic Signature(s) ?Signed: 10/28/2021 11:49:55 AM By: [name] EMT ?Entered By: [name] on 10/28/2021 11:49:55 ?

## 2021-10-28 NOTE — Progress Notes (Signed)
[name], [name] ([id number]) ?Visit Report for 10/28/2021 ?Problem List Details ?Patient Name: Date of Service: ?[name], [name] 10/28/2021 10:00 A M ?Medical Record Number: [id number] ?Patient Account Number: [id number] ?Date of Birth/Sex: Treating RN: ?[birth year] (66 y.o. 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, unspecified 09/03/2021 No Yes ?N95.0 Postmenopausal bleeding 09/03/2021 No Yes ?D70.9 Neutropenia, unspecified 09/03/2021 No Yes ?E11.9 Type 2 diabetes mellitus without complications 02/07/2955 No Yes ?Inactive Problems ?Resolved Problems ?Electronic Signature(s) ?Signed: 10/28/2021 3:17:11 PM By: [name] MD FACS ?Previous Signature: 10/28/2021 1:20:37 PM Version By: [name] EMT ?Entered By: [name] on 10/28/2021 15:17:11 ?-------------------------------------------------------------------------------- ?SuperBill Details ?Patient Name: Date of Service: ?[name], [name] 10/28/2021 ?Medical Record Number: [id number] ?Patient Account Number: [id number] ?Date of Birth/Sex: ?Treating RN: ?Jul 04, 1956 (66 y.o. 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, unspecified ?N95.0 Postmenopausal bleeding ?D70.9 Neutropenia, unspecified ?E11.9 Type 2 diabetes mellitus without complications ?Facility Procedures ?CPT4 Code: 46962952 ?Description: G0277-(Facility Use Only) HBOT full body chamber, 72mn , ICD-10 Diagnosis Description 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 C54.9  ?Malignant neoplasm of corpus uteri, unspecified N95.0 Postmenopausal bleeding ?Modifier: n ?Quantity: 4 ?Physician Procedures ?: CPT4 Code Description Modifier [id number] 01027- WC PHYS HYPERBARIC OXYGEN THERAPY ICD-10 Diagnosis Description 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

## 2021-10-29 ENCOUNTER — Encounter (HOSPITAL_BASED_OUTPATIENT_CLINIC_OR_DEPARTMENT_OTHER): Payer: BC Managed Care – PPO | Admitting: General Surgery

## 2021-10-29 DIAGNOSIS — C50212 Malignant neoplasm of upper-inner quadrant of left female breast: Secondary | ICD-10-CM | POA: Diagnosis not present

## 2021-10-29 DIAGNOSIS — N3041 Irradiation cystitis with hematuria: Secondary | ICD-10-CM | POA: Diagnosis not present

## 2021-10-29 DIAGNOSIS — Y842 Radiological procedure and radiotherapy as the cause of abnormal reaction of the patient, or of later complication, without mention of misadventure at the time of the procedure: Secondary | ICD-10-CM | POA: Diagnosis not present

## 2021-10-29 DIAGNOSIS — E119 Type 2 diabetes mellitus without complications: Secondary | ICD-10-CM | POA: Diagnosis not present

## 2021-10-29 DIAGNOSIS — C549 Malignant neoplasm of corpus uteri, unspecified: Secondary | ICD-10-CM | POA: Diagnosis not present

## 2021-10-29 DIAGNOSIS — D709 Neutropenia, unspecified: Secondary | ICD-10-CM | POA: Diagnosis not present

## 2021-10-29 DIAGNOSIS — N950 Postmenopausal bleeding: Secondary | ICD-10-CM | POA: Diagnosis not present

## 2021-10-29 DIAGNOSIS — L59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: Secondary | ICD-10-CM | POA: Diagnosis not present

## 2021-10-29 DIAGNOSIS — Z853 Personal history of malignant neoplasm of breast: Secondary | ICD-10-CM | POA: Diagnosis not present

## 2021-10-29 LAB — GLUCOSE, CAPILLARY
Glucose-Capillary: 148 mg/dL — ABNORMAL HIGH (ref 70–99)
Glucose-Capillary: 122 mg/dL — ABNORMAL HIGH (ref 70–99)

## 2021-10-29 NOTE — Progress Notes (Signed)
[name], [name] ([id number]) ?Visit Report for 10/29/2021 ?[name] Details ?Patient Name: Date of Service: ?[name] [name], [name] 10/29/2021 10:00 A M ?Medical Record Number: [id number] ?Patient Account Number: [id number] ?Date of Birth/Sex: Treating RN: ?April 22, 1956 (66 y.o. 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Treatment Segments: 4 ?Vital Signs ?Capillary Blood Glucose Reference Range: 80 - 120 mg / dl ?[name] Diabetic Blood Glucose Intervention Range: <131 mg/dl or >249 mg/dl ?Time Vitals Blood Respiratory Capillary Blood Glucose Pulse Action ?Type: ?Pulse: Temperature: ?Taken: ?Pressure: ?Rate: ?Glucose (mg/dl): ?Meter #: Oximetry (%) Taken: ?Pre 09:48 128/57 54 16 97.8 148 ?Post 12:00 119/57 59 18 97.4 122 ?Treatment Response ?Treatment Completion Status: Treatment Completed without Adverse Event ?Physician [name] Attestation: ?I certify that I supervised this [name] treatment in accordance with Medicare ?guidelines. A trained emergency response team is readily available  per Yes ?hospital policies and procedures. ?Continue HBOT as ordered. Yes ?Electronic Signature(s) ?Signed: 10/29/2021 3:36:54 PM By: [name] MD FACS ?Entered By: [name] on 10/29/2021 15:36:54 ?-------------------------------------------------------------------------------- ?[name] Safety Checklist Details ?Patient Name: ?Date of Service: ?[name] [name], [name] 10/29/2021 10:00 A M ?Medical Record Number: [id number] ?Patient Account Number: [id number] ?Date of Birth/Sex: ?Treating RN: ?January 20, 1956 (66 y.o. [name] ?Primary Care Anouk Critzer: [name] ?Other Clinician: ?Referring Matisyn Cabeza: ?Treating Katerin Negrete/Extender: [name] ?[name] ?[name]: 8 ?[name] Safety Checklist Items ?Safety Checklist ?Consent Form Signed ?Patient voided / foley secured and emptied ?When did you last eato 0800 this morning ?Last dose of injectable or oral agent 3/18 ?Ostomy pouch emptied and vented if applicable ?All implantable devices assessed, documented and approved ?Intravenous access site secured and place ?NA ?Valuables secured ?Linens and cotton and cotton/polyester blend (less than 51% polyester) ?Personal oil-based products / skin lotions / body lotions removed ?Wigs or hairpieces removed ?Smoking or tobacco materials removed ?Books / newspapers / magazines / loose paper removed ?Cologne, aftershave, perfume and deodorant removed ?Jewelry removed (may wrap wedding band) ?Make-up removed ?Hair care products removed ?Battery operated devices (external) removed ?Heating patches and chemical warmers removed ?Titanium eyewear removed ?Nail polish cured greater than 10 hours ?Casting material cured greater than 10 hours ?NA ?Hearing aids removed ?Loose dentures or partials removed ?Prosthetics have been removed ?NA ?Patient demonstrates correct use of air break device (if applicable) ?Patient concerns have been addressed ?Patient grounding bracelet on and cord attached to  chamber ?Specifics for Inpatients (complete in addition to above) ?Medication sheet sent with patient ?NA ?Intravenous medications needed or due during therapy sent with patient ?NA ?Drainage tubes (e.g. nasogastric tube or chest tube secured and vented) ?NA ?Endotracheal or Tracheotomy tube secured ?NA ?Cuff deflated of air and inflated with saline ?  NA ?Airway suctioned ?NA ?Electronic Signature(s) ?Signed: 10/29/2021 5:30:36 PM By: Levan Hurst RN, BSN ?Entered By: Levan Hurst on 10/29/2021 12:56:28 ?

## 2021-10-29 NOTE — Progress Notes (Signed)
[name], [name] ([id number]) ?Visit Report for 10/29/2021 ?Problem List Details ?Patient Name: Date of Service: ?[name], [name] 10/29/2021 10:00 A M ?Medical Record Number: [id number] ?Patient Account Number: [id number] ?Date of Birth/Sex: Treating RN: ?Sep 04, 1955 (66 y.o. 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 10/29/2021 ?Medical Record Number: [id number] ?Patient Account Number: [id number] ?Date of Birth/Sex: ?Treating RN: ?1955/09/24 (66 y.o. 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, unspecified ?N95.0 Postmenopausal bleeding ?D70.9 Neutropenia, unspecified ?E11.9 Type 2 diabetes mellitus without complications ?Facility Procedures ?CPT4 Code: 88828003 ?Description: G0277-(Facility Use Only) HBOT full body chamber, 51mn , ?Modifier: ?Quantity: 4 ?Physician Procedures ?: CPT4 Code Description Modifier [id number] 05697- WC PHYS HYPERBARIC OXYGEN THERAPY ICD-10 Diagnosis Description 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

## 2021-10-29 NOTE — Progress Notes (Signed)
KELLAN, BOEHLKE ([id number]) ?Visit Report for 10/29/2021 ?Arrival Information Details ?Patient Name: Date of Service: ?[name], [name] [name] 10/29/2021 10:00 A M ?Medical Record Number: [id number] ?Patient Account Number: [id number] ?Date of Birth/Sex: Treating RN: ?1955-09-04 (66 y.o. [name], [name] ?Primary Care Bridgitte Felicetti: [name] Other Clinician: ?Referring Nayel Purdy: ?Treating Sherika Kubicki/Extender: [name] ?[name] ?[name]: 8 ?Visit Information History Since Last Visit ?Added or deleted any medications: No ?Patient Arrived: Ambulatory ?Had a fall or experienced change in No ?Arrival Time: 09:45 ?activities of daily living that may affect ?Accompanied By: alone ?risk of falls: ?Transfer Assistance: None ?Signs or symptoms of abuse/neglect since last visito No ?Patient Identification Verified: Yes ?Hospitalized since last visit: No ?Secondary Verification Process Completed: Yes ?Implantable device outside of the clinic excluding No ?Patient Requires Transmission-Based Precautions: No ?cellular tissue based products placed in the center ?Patient Has Alerts: Yes since last visit: ?Patient Alerts: Patient on Blood Thinner ?Pain Present Now: No ?Electronic Signature(s) ?Signed: 10/29/2021 5:30:36 PM By: Levan Hurst RN, BSN ?Entered By: Levan Hurst on 10/29/2021 12:54:22 ?-------------------------------------------------------------------------------- ?Encounter Discharge Information Details ?Patient Name: Date of Service: ?[name], [name] [name] 10/29/2021 10:00 A M ?Medical Record Number: [id number] ?Patient Account Number: [id number] ?Date of Birth/Sex: Treating RN: ?July 02, 1956 (66 y.o. [name], [name] ?Primary Care Danne Vasek: [name] Other Clinician: ?Referring Jaydalyn Demattia: ?Treating Tarique Loveall/Extender: [name] ?[name] ?[name]: 8 ?Encounter Discharge Information Items ?Discharge Condition: Stable ?Ambulatory Status: Ambulatory ?Discharge  Destination: Home ?Transportation: Private Auto ?Accompanied By: alone ?Schedule Follow-up Appointment: Yes ?Clinical Summary of Care: Patient Declined ?Electronic Signature(s) ?Signed: 10/29/2021 5:30:36 PM By: Levan Hurst RN, BSN ?Entered By: Levan Hurst on 10/29/2021 13:02:11 ?-------------------------------------------------------------------------------- ?Vitals Details ?Patient Name: ?Date of Service: ?[name] [name], [name] [name] 10/29/2021 10:00 A M ?Medical Record Number: [id number] ?Patient Account Number: [id number] ?Date of Birth/Sex: ?Treating RN: ?September 19, 1955 (66 y.o. [name], [name] ?Primary Care Harlee Eckroth: [name] ?Other Clinician: ?Referring Brelan Hannen: ?Treating Ellayna Hilligoss/Extender: [name] ?[name] ?[name]: 8 ?Vital Signs ?Time Taken: 09:48 ?Temperature (??F): 97.8 ?Height (in): 62 ?Pulse (bpm): 54 ?Weight (lbs): 160 ?Respiratory Rate (breaths/min): 16 ?Body Mass Index (BMI): 29.3 ?Blood Pressure (mmHg): 128/57 ?Capillary Blood Glucose (mg/dl): 148 ?Reference Range: 80 - 120 mg / dl ?Electronic Signature(s) ?Signed: 10/29/2021 5:30:36 PM By: Levan Hurst RN, BSN ?Entered By: Levan Hurst on 10/29/2021 12:55:12 ?

## 2021-10-30 ENCOUNTER — Encounter (HOSPITAL_BASED_OUTPATIENT_CLINIC_OR_DEPARTMENT_OTHER): Payer: BC Managed Care – PPO | Admitting: Internal Medicine

## 2021-10-30 DIAGNOSIS — N3041 Irradiation cystitis with hematuria: Secondary | ICD-10-CM

## 2021-10-30 DIAGNOSIS — Y842 Radiological procedure and radiotherapy as the cause of abnormal reaction of the patient, or of later complication, without mention of misadventure at the time of the procedure: Secondary | ICD-10-CM | POA: Diagnosis not present

## 2021-10-30 DIAGNOSIS — C549 Malignant neoplasm of corpus uteri, unspecified: Secondary | ICD-10-CM | POA: Diagnosis not present

## 2021-10-30 DIAGNOSIS — D709 Neutropenia, unspecified: Secondary | ICD-10-CM | POA: Diagnosis not present

## 2021-10-30 DIAGNOSIS — L59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: Secondary | ICD-10-CM

## 2021-10-30 DIAGNOSIS — Z853 Personal history of malignant neoplasm of breast: Secondary | ICD-10-CM | POA: Diagnosis not present

## 2021-10-30 DIAGNOSIS — E119 Type 2 diabetes mellitus without complications: Secondary | ICD-10-CM | POA: Diagnosis not present

## 2021-10-30 DIAGNOSIS — N950 Postmenopausal bleeding: Secondary | ICD-10-CM | POA: Diagnosis not present

## 2021-10-30 DIAGNOSIS — C50212 Malignant neoplasm of upper-inner quadrant of left female breast: Secondary | ICD-10-CM | POA: Diagnosis not present

## 2021-10-30 LAB — GLUCOSE, CAPILLARY
Glucose-Capillary: 209 mg/dL — ABNORMAL HIGH (ref 70–99)
Glucose-Capillary: 149 mg/dL — ABNORMAL HIGH (ref 70–99)

## 2021-10-30 NOTE — Progress Notes (Signed)
[name], [name] ([id number]) ?Visit Report for 10/30/2021 ?Arrival Information Details ?Patient Name: Date of Service: ?HO [place], [name] 10/30/2021 10:15 A M ?Medical Record Number: [id number] ?Patient Account Number: [id number] ?Date of Birth/Sex: Treating RN: ?1955-09-13 (66 y.o. [name], [name] ?Primary Care Samanta Gal: [name] Other Clinician: ?Referring Kimela Malstrom: ?Treating Marleni Gallardo/Extender: [name] ?[name] ?[name]: 8 ?Visit Information History Since Last Visit ?Added or deleted any medications: No ?Patient Arrived: Ambulatory ?Any new allergies or adverse reactions: No ?Arrival Time: 09:59 ?Had a fall or experienced change in No ?Accompanied By: alone ?activities of daily living that may affect ?Transfer Assistance: None ?risk of falls: ?Patient Identification Verified: Yes ?Signs or symptoms of abuse/neglect since last visito No ?Secondary Verification Process Completed: Yes ?Hospitalized since last visit: No ?Patient Requires Transmission-Based Precautions: No ?Implantable device outside of the clinic excluding No ?Patient Has Alerts: Yes ?cellular tissue based products placed in the center ?Patient Alerts: Patient on Blood Thinner since last visit: ?Pain Present Now: No ?Electronic Signature(s) ?Signed: 10/30/2021 4:27:25 PM By: Levan Hurst RN, BSN ?Entered By: Levan Hurst on 10/30/2021 16:04:24 ?-------------------------------------------------------------------------------- ?Encounter Discharge Information Details ?Patient Name: Date of Service: ?HO [place], [name] 10/30/2021 10:15 A M ?Medical Record Number: [id number] ?Patient Account Number: [id number] ?Date of Birth/Sex: Treating RN: ?11-Jun-1956 (66 y.o. [name], [name] ?Primary Care Nikiesha Milford: [name] Other Clinician: ?Referring Jaquez Farrington: ?Treating Marcy Sookdeo/Extender: [name] ?[name] ?[name]: 8 ?Encounter Discharge Information Items ?Discharge Condition:  Stable ?Ambulatory Status: Ambulatory ?Discharge Destination: Home ?Transportation: Private Auto ?Accompanied By: alone ?Schedule Follow-up Appointment: Yes ?Clinical Summary of Care: Patient Declined ?Electronic Signature(s) ?Signed: 10/30/2021 4:27:25 PM By: Levan Hurst RN, BSN ?Entered By: Levan Hurst on 10/30/2021 16:08:51 ?-------------------------------------------------------------------------------- ?Vitals Details ?Patient Name: ?Date of Service: ?[name], [name] 10/30/2021 10:15 A M ?Medical Record Number: [id number] ?Patient Account Number: [id number] ?Date of Birth/Sex: ?Treating RN: ?Jan 30, 1956 (66 y.o. [name], [name] ?Primary Care Mattias Walmsley: [name] ?Other Clinician: ?Referring Francisca Langenderfer: ?Treating Farren Landa/Extender: [name] ?[name] ?[name]: 8 ?Vital Signs ?Time Taken: 09:59 ?Temperature (??F): 98.2 ?Height (in): 62 ?Pulse (bpm): 65 ?Weight (lbs): 160 ?Respiratory Rate (breaths/min): 16 ?Body Mass Index (BMI): 29.3 ?Blood Pressure (mmHg): 124/92 ?Capillary Blood Glucose (mg/dl): 209 ?Reference Range: 80 - 120 mg / dl ?Electronic Signature(s) ?Signed: 10/30/2021 4:27:25 PM By: Levan Hurst RN, BSN ?Entered By: Levan Hurst on 10/30/2021 16:04:55 ?

## 2021-11-02 ENCOUNTER — Encounter (HOSPITAL_BASED_OUTPATIENT_CLINIC_OR_DEPARTMENT_OTHER): Payer: BC Managed Care – PPO | Admitting: Internal Medicine

## 2021-11-02 ENCOUNTER — Encounter (HOSPITAL_BASED_OUTPATIENT_CLINIC_OR_DEPARTMENT_OTHER): Payer: BC Managed Care – PPO | Attending: Internal Medicine | Admitting: Internal Medicine

## 2021-11-02 DIAGNOSIS — Z8543 Personal history of malignant neoplasm of ovary: Secondary | ICD-10-CM

## 2021-11-02 DIAGNOSIS — E119 Type 2 diabetes mellitus without complications: Secondary | ICD-10-CM | POA: Diagnosis not present

## 2021-11-02 DIAGNOSIS — C549 Malignant neoplasm of corpus uteri, unspecified: Secondary | ICD-10-CM

## 2021-11-02 DIAGNOSIS — N950 Postmenopausal bleeding: Secondary | ICD-10-CM

## 2021-11-02 DIAGNOSIS — D709 Neutropenia, unspecified: Secondary | ICD-10-CM | POA: Insufficient documentation

## 2021-11-02 DIAGNOSIS — N3041 Irradiation cystitis with hematuria: Secondary | ICD-10-CM | POA: Insufficient documentation

## 2021-11-02 DIAGNOSIS — L59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: Secondary | ICD-10-CM

## 2021-11-02 DIAGNOSIS — C50212 Malignant neoplasm of upper-inner quadrant of left female breast: Secondary | ICD-10-CM | POA: Diagnosis not present

## 2021-11-02 LAB — GLUCOSE, CAPILLARY
Glucose-Capillary: 209 mg/dL — ABNORMAL HIGH (ref 70–99)
Glucose-Capillary: 151 mg/dL — ABNORMAL HIGH (ref 70–99)

## 2021-11-02 NOTE — Progress Notes (Addendum)
[name], [name] ([id number]) ?Visit Report for 11/02/2021 ?Chief Complaint Document Details ?Patient Name: Date of Service: ?HO [place], [name] 11/02/2021 9:30 A M ?Medical Record Number: [id number] ?Patient Account Number: [id number] ?Date of Birth/Sex: Treating RN: ?[birth year] (66 y.o. 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 ?Electronic Signature(s) ?Signed: 11/02/2021 10:53:38 AM By: [name] Shan DO ?Entered By: [name] on 11/02/2021 10:16:57 ?-------------------------------------------------------------------------------- ?HPI Details ?Patient Name: Date of Service: ?HO [place], [name] 11/02/2021 9:30 A M ?Medical Record Number: [id number] ?Patient Account Number: [id number] ?Date of Birth/Sex: Treating RN: ?16-Dec-1955 (66 y.o. F) ?Primary Care Provider: [name] Other Clinician: ?Referring Provider: ?Treating Provider/Extender: [name] ?[name] ?[name]: 8 ?History of Present Illness ?HPI Description: 09/03/2021 ?Patient has a history of ovarian cancer in 2006 treated with chemotherapy carbo/T axol. In 2016 she had an MRI that showed a pelvic mass without invasion ?into the bladder but abutting to the rectum. 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 She had a radical upper vaginectomy and low anterior resection with colostomy. From ?01/29/2015 to 03/10/2015 she received adjunct of radiation. She subsequently developed complications of her ureters and  bladder and she ultimately had the ?bladder removed. 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 She reports that since her radiation she has also developed vaginal and ?rectal bleeding It is chronic but improves with the use of doxycycline. She also has a history of diagnosed invasive ductal carcinoma of the left breast in 2017 ?and underwent bilateral mastectomy. ?3/6; patient presents for her first [name] treatment. She continues to have rectal and vaginal bleeding. She has no issues or complaints today. ?4/3; patient presents for 30-day evaluation. She reports improvement in her rectal and vaginal bleeding. She states the amount is less. She reports no other ?issues. She is content with her current [name] treatments and would like to continue these. ?Electronic Signature(s) ?Signed: 11/02/2021 10:53:38 AM By: [name] Shan DO ?Entered By: [name] on 11/02/2021 10:18:18 ?-------------------------------------------------------------------------------- ?Physical Exam Details ?Patient Name: Date of Service: ?HO [employer], [name] 11/02/2021 9:30 A M ?Medical Record Number: [id number] ?Patient Account Number: [id number] ?Date of Birth/Sex: Treating RN: ?Mar 30, 1956 (66 y.o. F) ?Primary Care Provider: [name] Other Clinician: ?Referring Provider: ?Treating Provider/Extender: [name] ?[name] ?[name]: 8 ?Constitutional ?respirations regular, non-labored and within target range for patient.[name] Kitchen ?Psychiatric ?pleasant and cooperative. ?Electronic Signature(s) ?Signed: 11/02/2021 10:53:38 AM By: [name] Shan DO ?Entered By: [name] on 11/02/2021 10:18:47 ?-------------------------------------------------------------------------------- ?Physician Orders Details ?Patient Name: Date of Service: ?HO [place], [name] 11/02/2021 9:30 A M ?Medical Record Number: [id number] ?Patient Account Number: [id number] ?Date of Birth/Sex: Treating RN: ?[birth year] (66 y.o. [name] ?Primary Care Provider: [name] Other Clinician: ?Referring Provider: ?Treating Provider/Extender: [name] ?[name] ?[name]: 8 ?Verbal / Phone Orders: No ?Diagnosis Coding ?ICD-10 Coding ?Code Description ?N30.41 Irradiation cystitis with hematuria ?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 ?C50.212 Malignant neoplasm of upper-inner quadrant of left female breast ?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, unspecified ?N95.0 Postmenopausal bleeding ?D70.9 Neutropenia, unspecified ?E11.9 Type 2  diabetes mellitus without complications ?Follow-up Appointments ?Return appointment in 1 month. 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 ?Follow Hyperbaric Oxygen Glycemia Protocol ?A frin (Oxymetazoline HCL) 0.05% nasal spray - 1 spray in both nostrils daily as needed prior to [name] treatment for difficulty clearing ears ?GLYCEMIA INTERVENTIONS PROTOCOL ?PRE-[name] GLYCEMIA INTERVENTIONS ?ACTION INTERVENTION ?Obtain pre-[name] capillary blood glucose (ensure ?1 ?1 physician order is in chart). ?A. Notify [name] physician and await physician ?orders. ?2 If result is 70 mg/dl or below: B. If the result meets the hospital definition of a ?critical result, follow hospital policy. ?A. Give patient an 8 ounce Glucerna Shake??, an ?8 ounce Ensure??, or 8 ounces of a ?Glucerna/Ensure equivalent dietary ?supplement*. ?B. Wait 30 minutes. ?If result is 71 mg/dl to 130 mg/dl: C. Retest patients capillary blood glucose ?(CBG). ?D. If result greater than or equal to 110  mg/dl, ?proceed with [name]. ?If result less than 110 mg/dl, notify [name] ?physician and consider holding [name]. ?If result is 131 mg/dl to 249 mg/dl: A. Proceed with [name]. ?A. Notify [name] physician and await physician ?orders. ?B. It is recommended to hold [name] and do ?If result is 250 mg/dl or greater: ?blood/urine ketone testing. ?C. If the result meets the hospital definition of a ?critical result, follow hospital policy. ?POST-[name] GLYCEMIA INTERVENTIONS ?ACTION INTERVENTION ?Obtain post [name] capillary blood glucose (ensure ?1 ?physician order is in chart). ?A. Notify [name] physician and await physician ?orders. ?2 If result is 70 mg/dl or below: B. If the result meets the hospital definition of a ?critical result, follow hospital policy. ?A. Give patient an 8 ounce Glucerna Shake??, an ?8 ounce Ensure??, or 8 ounces of a ?Glucerna/Ensure equivalent dietary ?supplement*. ?B. Wait 15 minutes for symptoms of ?If result is 71 mg/dl to 100 mg/dl: hypoglycemia (i.e. nervousness, anxiety, ?sweating, chills, clamminess, irritability, ?confusion, tachycardia or dizziness). ?C. If patient asymptomatic, discharge patient. ?If patient symptomatic, repeat capillary blood ?glucose (CBG) and notify [name] physician. ?If result is 101 mg/dl to 249 mg/dl: A. Discharge patient. ?A. Notify [name] physician and await physician ?orders. ?B. It is recommended to do blood/urine ketone ?If result is 250 mg/dl or greater: ?testing. ?C. If the result meets the hospital definition of a ?critical result, follow hospital policy. ?*Juice or candies are NOT equivalent products. If patient refuses the Glucerna or Ensure, please consult the hospital ?dietitian for an appropriate substitute. ?Electronic Signature(s) ?Signed: 11/10/2021 4:04:02 PM By: Lorrin Jackson ?Signed: 11/23/2021 12:02:04 PM By: [name] Shan DO ?Entered By: Lorrin Jackson on 11/10/2021 16:04:02 ?-------------------------------------------------------------------------------- ?Problem List  Details ?Patient Name: ?Date of Service: ?[name], [name] 11/02/2021 9:30 A M ?Medical Record Number: [id number] ?Patient Account Number: [id number] ?Date of Birth/Sex: ?Treating RN: ?Jan 01, 1956 (66 y.o. F) ?

## 2021-11-02 NOTE — Progress Notes (Addendum)
HERMINE, FERIA ([id number]) ?Visit Report for 11/02/2021 ?Arrival Information Details ?Patient Name: Date of Service: ?HO [place], [name] 11/02/2021 10:30 A M ?Medical Record Number: [id number] ?Patient Account Number: [id number] ?Date of Birth/Sex: Treating RN: ?Apr 21, 1956 (66 y.o. [name], [name] ?Primary Care Carmencita Cusic: [name] ?Other Clinician: [name] ?Referring Lynnelle Mesmer: ?Treating [place] Giammona/Extender: [name] ?[name] ?[name]: 8 ?Visit Information History Since Last Visit ?All ordered tests and consults were completed: Yes ?Patient Arrived: Ambulatory ?Added or deleted any medications: No ?Arrival Time: 10:30 ?Any new allergies or adverse reactions: No ?Accompanied By: self ?Had a fall or experienced change in No ?Transfer Assistance: None ?activities of daily living that may affect ?Patient Identification Verified: Yes ?risk of falls: ?Secondary Verification Process Completed: Yes ?Signs or symptoms of abuse/neglect since last visito No ?Patient Requires Transmission-Based Precautions: No ?Hospitalized since last visit: No ?Patient Has Alerts: Yes ?Implantable device outside of the clinic excluding No ?Patient Alerts: Patient on Blood Thinner cellular tissue based products placed in the center ?since last visit: ?Pain Present Now: No ?Electronic Signature(s) ?Signed: 11/02/2021 1:28:10 PM By: [name] CHT EMT BS ?, , ?Previous Signature: 11/02/2021 10:32:48 AM Version By: [name] CHT EMT BS ?, , ?Entered By: [name] on 11/02/2021 10:33:23 ?-------------------------------------------------------------------------------- ?Encounter Discharge Information Details ?Patient Name: Date of Service: ?HO [place], [name] 11/02/2021 10:30 A M ?Medical Record Number: [id number] ?Patient Account Number: [id number] ?Date of Birth/Sex: Treating RN: ?March 03, 1956 (66 y.o. [name], [name] ?Primary Care Suheyb Raucci: [name] ?Other Clinician: [name] ?Referring Arelys Glassco: ?Treating Shonnie Poudrier/Extender: [name] ?[name] ?[name]: 8 ?Encounter Discharge Information Items ?Discharge Condition: Stable ?Ambulatory Status: Ambulatory ?Discharge Destination: Home ?Transportation: Private Auto ?Accompanied By: self ?Schedule Follow-up Appointment: No ?Clinical Summary of Care: ?Notes ?Called patient to make sure she arrived at home and was feeling okay as she left prior to a secondary, manual BP. ?Electronic Signature(s) ?Signed: 11/02/2021 1:53:19 PM By: [name] CHT EMT BS ?, , ?Entered By: [name] on 11/02/2021 13:53:19 ?-------------------------------------------------------------------------------- ?Vitals Details ?Patient Name: ?Date of Service: ?[name] 11/02/2021 10:30 A M ?Medical Record Number: [id number] ?Patient Account Number: [id number] ?Date of Birth/Sex: ?Treating RN: ?June 29, 1956 (66 y.o. [name], [name] ?Primary Care [place]: [name] ?Other Clinician: [name] ?Referring Tomekia Helton: ?Treating Rayah Fines/Extender: [name] ?[name] ?[name]: 8 ?Vital Signs ?Time Taken: 10:08 ?Temperature (??F): 98.5 ?Height (in): 62 ?Pulse (bpm): 77 ?Weight (lbs): 160 ?Respiratory Rate (breaths/min): 18 ?Body Mass Index (BMI): 29.3 ?Blood Pressure (mmHg): 134/53 ?Capillary Blood Glucose (mg/dl): 209 ?Reference Range: 80 - 120 mg / dl ?Electronic Signature(s) ?Signed: 11/02/2021 1:28:10 PM By: [name] CHT EMT BS ?, , ?Entered By: [name] on 11/02/2021 10:33:53 ?

## 2021-11-02 NOTE — Progress Notes (Signed)
[name], [name] ([id number]) ?Visit Report for 10/30/2021 ?[name] Details ?Patient Name: Date of Service: ?HO [place], [name] 10/30/2021 10:15 A M ?Medical Record Number: [id number] ?Patient Account Number: [id number] ?Date of Birth/Sex: Treating RN: ?[birth year] (66 y.o. 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Treatment Segments: 4 ?Vital Signs ?Capillary Blood Glucose Reference Range: 80 - 120 mg / dl ?[name] Diabetic Blood Glucose Intervention Range: <131 mg/dl or >249 mg/dl ?Time Vitals Blood Respiratory Capillary Blood Glucose Pulse Action ?Type: ?Pulse: Temperature: ?Taken: ?Pressure: ?Rate: ?Glucose (mg/dl): ?Meter #: Oximetry (%) Taken: ?Pre 09:59 124/92 65 16 98.2 209 ?Post 12:17 143/67 62 18 97.7 149 ?Treatment Response ?Treatment Completion Status: Treatment Completed without Adverse Event ?Physician [name] Attestation: ?I certify that I supervised this [name] treatment in accordance with Medicare ?guidelines. A trained emergency response team is readily available  per Yes ?hospital policies and procedures. ?Continue HBOT as ordered. Yes ?Electronic Signature(s) ?Signed: 11/02/2021 9:02:45 AM By: [name] Shan DO ?Previous Signature: 10/30/2021 4:27:25 PM Version By: [name] Hurst RN, BSN ?Entered By: [name] on 11/02/2021 09:01:14 ?-------------------------------------------------------------------------------- ?[name] Safety Checklist Details ?Patient Name: ?Date of Service: ?[name], [name] 10/30/2021 10:15 A M ?Medical Record Number: [id number] ?Patient Account Number: [id number] ?Date of Birth/Sex: ?Treating RN: ?Oct 04, 1955 (66 y.o. [name], [name] ?Primary Care [name]: [name] ?Other Clinician: ?Referring [name]: ?Treating [name]/Extender: [name] ?[name] ?[name]: 8 ?[name] Safety Checklist Items ?Safety Checklist ?Consent Form Signed ?Patient voided / foley secured and emptied ?When did you last eato 0830 ?Last dose of injectable or oral agent 10/17/21 ?Ostomy pouch emptied and vented if applicable ?All implantable devices assessed, documented and approved ?Intravenous access site secured and place ?Valuables secured ?Linens and cotton and cotton/polyester blend (less than 51% polyester) ?Personal oil-based products / skin lotions / body lotions removed ?Wigs or hairpieces removed ?Smoking or tobacco materials removed ?Books / newspapers / magazines / loose paper removed ?Cologne, aftershave, perfume and deodorant removed ?Jewelry removed (may wrap wedding band) ?Make-up removed ?Hair care products removed ?Battery operated devices (external) removed ?Heating patches and chemical warmers removed ?Titanium eyewear removed ?Nail polish cured greater than 10 hours ?Casting material cured greater than 10 hours ?NA ?Hearing aids removed ?NA ?Loose dentures or partials removed ?Prosthetics have been removed ?NA ?Patient demonstrates correct use of air break device (if applicable) ?Patient concerns have been  addressed ?Patient grounding bracelet on and cord attached to chamber ?Specifics for Inpatients (complete in addition to above) ?Medication sheet sent with patient ?NA ?Intravenous medications needed or due during therapy sent with patient ?NA ?Drainage tubes (e.g. nasogastric tube or chest tube secured and vented) ?NA ?Endotracheal or Tracheotomy tube secured ?NA ?  Cuff deflated of air and inflated with saline ?NA ?Airway suctioned ?NA ?Electronic Signature(s) ?Signed: 10/30/2021 4:27:25 PM By: [name] Hurst RN, BSN ?Entered By: [name] on 10/30/2021 16:05:40 ?

## 2021-11-02 NOTE — Progress Notes (Addendum)
SUSANNAH, CARBIN ([id number]) ?Visit Report for 11/02/2021 ?Arrival Information Details ?Patient Name: Date of Service: ?HO [place], [name] 11/02/2021 9:30 A M ?Medical Record Number: [id number] ?Patient Account Number: [id number] ?Date of Birth/Sex: Treating RN: ?[birth year] (66 y.o. Sue Lush ?Primary Care Roise Emert: [name] Other Clinician: ?Referring Miyako Oelke: ?Treating Gail Vendetti/Extender: [name] ?[name] ?[name]: 8 ?Visit Information History Since Last Visit ?Added or deleted any medications: No ?Patient Arrived: Ambulatory ?Any new allergies or adverse reactions: No ?Arrival Time: 15:02 ?Had a fall or experienced change in No ?Transfer Assistance: None ?activities of daily living that may affect ?Patient Identification Verified: Yes ?risk of falls: ?Secondary Verification Process Completed: Yes ?Signs or symptoms of abuse/neglect since last visito No ?Patient Requires Transmission-Based Precautions: No ?Hospitalized since last visit: No ?Patient Has Alerts: Yes ?Implantable device outside of the clinic excluding No ?Patient Alerts: Patient on Blood Thinner ?cellular tissue based products placed in the center ?since last visit: ?Pain Present Now: No ?Electronic Signature(s) ?Signed: 11/10/2021 3:03:32 PM By: Lorrin Jackson ?Entered By: Lorrin Jackson on 11/10/2021 15:03:31 ?-------------------------------------------------------------------------------- ?Clinic Level of Care Assessment Details ?Patient Name: Date of Service: ?HO [place], [name] 11/02/2021 9:30 A M ?Medical Record Number: [id number] ?Patient Account Number: [id number] ?Date of Birth/Sex: Treating RN: ?08/22/1955 (66 y.o. Sue Lush ?Primary Care Jewelle Whitner: [name] Other Clinician: ?Referring Rilyn Upshaw: ?Treating Ardith Test/Extender: [name] ?[name] ?[name]: 8 ?Clinic Level of Care Assessment Items ?TOOL 4 Quantity Score ?X- 1 0 ?Use when only an EandM is  performed on FOLLOW-UP visit ?ASSESSMENTS - Nursing Assessment / Reassessment ?X- 1 10 ?Reassessment of Co-morbidities (includes updates in patient status) ?X- 1 5 ?Reassessment of Adherence to Treatment Plan ?ASSESSMENTS - Wound and Skin A ssessment / Reassessment ?'[]'$  - 0 ?Simple Wound Assessment / Reassessment - one wound ?'[]'$  - 0 ?Complex Wound Assessment / Reassessment - multiple wounds ?'[]'$  - 0 ?Dermatologic / Skin Assessment (not related to wound area) ?ASSESSMENTS - Focused Assessment ?'[]'$  - 0 ?Circumferential Edema Measurements - multi extremities ?'[]'$  - 0 ?Nutritional Assessment / Counseling / Intervention ?'[]'$  - 0 ?Lower Extremity Assessment (monofilament, tuning fork, pulses) ?'[]'$  - 0 ?Peripheral Arterial Disease Assessment (using hand held doppler) ?ASSESSMENTS - Ostomy and/or Continence Assessment and Care ?'[]'$  - 0 ?Incontinence Assessment and Management ?'[]'$  - 0 ?Ostomy Care Assessment and Management (repouching, etc.) ?PROCESS - Coordination of Care ?'[]'$  - 0 ?Simple Patient / Family Education for ongoing care ?X- 1 20 ?Complex (extensive) Patient / Family Education for ongoing care ?'[]'$  - 0 ?Staff obtains Consents, Records, T Results / Process Orders ?est ?'[]'$  - 0 ?Staff telephones HHA, Nursing Homes / Clarify orders / etc ?'[]'$  - 0 ?Routine Transfer to another Facility (non-emergent condition) ?'[]'$  - 0 ?Routine Hospital Admission (non-emergent condition) ?'[]'$  - 0 ?New Admissions / Biomedical engineer / Ordering NPWT Apligraf, etc. ?, ?'[]'$  - 0 ?Emergency Hospital Admission (emergent condition) ?'[]'$  - 0 ?Simple Discharge Coordination ?'[]'$  - 0 ?Complex (extensive) Discharge Coordination ?PROCESS - Special Needs ?'[]'$  - 0 ?Pediatric / Minor Patient Management ?'[]'$  - 0 ?Isolation Patient Management ?'[]'$  - 0 ?Hearing / Language / Visual special needs ?'[]'$  - 0 ?Assessment of Community assistance (transportation, D/C planning, etc.) ?'[]'$  - 0 ?Additional assistance / Altered mentation ?'[]'$  - 0 ?Support Surface(s) Assessment  (bed, cushion, seat, etc.) ?INTERVENTIONS - Wound Cleansing / Measurement ?'[]'$  - 0 ?Simple Wound Cleansing - one wound ?'[]'$  - 0 ?Complex Wound Cleansing - multiple wounds ?'[]'$  - 0 ?Wound Imaging (  photographs - any number of wounds) ?'[]'$  - 0 ?Wound Tracing (instead of photographs) ?'[]'$  - 0 ?Simple Wound Measurement - one wound ?'[]'$  - 0 ?Complex Wound Measurement - multiple wounds ?INTERVENTIONS - Wound Dressings ?'[]'$  - 0 ?Small Wound Dressing one or multiple wounds ?'[]'$  - 0 ?Medium Wound Dressing one or multiple wounds ?'[]'$  - 0 ?Large Wound Dressing one or multiple wounds ?'[]'$  - 0 ?Application of Medications - topical ?'[]'$  - 0 ?Application of Medications - injection ?INTERVENTIONS - Miscellaneous ?'[]'$  - 0 ?External ear exam ?'[]'$  - 0 ?Specimen Collection (cultures, biopsies, blood, body fluids, etc.) ?'[]'$  - 0 ?Specimen(s) / Culture(s) sent or taken to Lab for analysis ?'[]'$  - 0 ?Patient Transfer (multiple staff / Civil Service fast streamer / Similar devices) ?'[]'$  - 0 ?Simple Staple / Suture removal (25 or less) ?'[]'$  - 0 ?Complex Staple / Suture removal (26 or more) ?'[]'$  - 0 ?Hypo / Hyperglycemic Management (close monitor of Blood Glucose) ?'[]'$  - 0 ?Ankle / Brachial Index (ABI) - do not check if billed separately ?X- 1 5 ?Vital Signs ?Has the patient been seen at the hospital within the last three years: Yes ?Total Score: 40 ?Level Of Care: New/Established - Level 2 ?Electronic Signature(s) ?Signed: 11/10/2021 4:54:57 PM By: Lorrin Jackson ?Entered By: Lorrin Jackson on 11/10/2021 16:06:52 ?-------------------------------------------------------------------------------- ?Encounter Discharge Information Details ?Patient Name: Date of Service: ?HO [place], [name] 11/02/2021 9:30 A M ?Medical Record Number: [id number] ?Patient Account Number: [id number] ?Date of Birth/Sex: Treating RN: ?1955/11/30 (66 y.o. Sue Lush ?Primary Care Archer Vise: [name] Other Clinician: ?Referring Khelani Kops: ?Treating Deunta Beneke/Extender: [name] ?[name] ?[name]: 8 ?Encounter Discharge Information Items ?Discharge Condition: Stable ?Ambulatory Status: Ambulatory ?Discharge Destination: Home ?Transportation: Private Auto ?Schedule Follow-up Appointment: Yes ?Clinical Summary of Care: Provided on 11/02/2021 ?Form Type Recipient ?Paper Patient Patient ?Electronic Signature(s) ?Signed: 11/10/2021 4:09:17 PM By: Lorrin Jackson ?Entered By: Lorrin Jackson on 11/10/2021 16:09:17 ?-------------------------------------------------------------------------------- ?Lower Extremity Assessment Details ?Patient Name: Date of Service: ?HO [place], [name] 11/02/2021 9:30 A M ?Medical Record Number: [id number] ?Patient Account Number: [id number] ?Date of Birth/Sex: Treating RN: ?06-10-1956 (66 y.o. Sue Lush ?Primary Care Donye Campanelli: [name] Other Clinician: ?Referring Ezrie Bunyan: ?Treating Ammaar Encina/Extender: [name] ?[name] ?[name]: 8 ?Electronic Signature(s) ?Signed: 11/10/2021 4:01:13 PM By: Lorrin Jackson ?Entered By: Lorrin Jackson on 11/10/2021 16:01:12 ?-------------------------------------------------------------------------------- ?Multi Wound Chart Details ?Patient Name: Date of Service: ?HO [place], [name] 11/02/2021 9:30 A M ?Medical Record Number: [id number] ?Patient Account Number: [id number] ?Date of Birth/Sex: ?Treating RN: ?October 24, 1955 (66 y.o. F) ?Primary Care Lamarkus Nebel: [name] ?Other Clinician: ?Referring Regie Bunner: ?Treating Patric Buckhalter/Extender: [name] ?[name] ?[name]: 8 ?Wound Assessments ?Treatment Notes ?Electronic Signature(s) ?Signed: 11/02/2021 10:53:38 AM By: [name] DO ?Entered By: [name] on 11/02/2021 10:16:46 ?-------------------------------------------------------------------------------- ?Multi-Disciplinary Care Plan Details ?Patient Name: ?Date of Service: ?[name], [name] 11/02/2021 9:30 A M ?Medical Record Number: [id number] ?Patient  Account Number: [id number] ?Date of Birth/Sex: ?Treating RN: ?1955-09-26 (66 y.o. F) [name], [name] ?Primary Care Gabi Mcfate: [name] ?Other Clinician: ?Referring Umeka Wrench: ?Treating Hollyanne Schloesser/Extender: [place],

## 2021-11-02 NOTE — Progress Notes (Signed)
BARI, LEIB ([id number]) ?Visit Report for 10/30/2021 ?SuperBill Details ?Patient Name: Date of Service: ?[name] 10/30/2021 ?Medical Record Number: [id number] ?Patient Account Number: [id number] ?Date of Birth/Sex: Treating RN: ?[birth year] (66 y.o. 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, unspecified ?N95.0 Postmenopausal bleeding ?D70.9 Neutropenia, unspecified ?E11.9 Type 2 diabetes mellitus without complications ?Facility Procedures ?CPT4 Code Description Modifier Quantity ?91694503 G0277-(Facility Use Only) HBOT full body chamber, 30mn ?, 4 ?Physician Procedures ?Quantity ?CPT4 Code Description Modifier ?[id number]- WC PHYS HYPERBARIC OXYGEN THERAPY 1 ?ICD-10 Diagnosis Description ?N30.41 Irradiation cystitis with hematuria ?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

## 2021-11-02 NOTE — Progress Notes (Signed)
LEITA, LINDBLOOM ([id number]) ?Visit Report for 11/02/2021 ?Problem List Details ?Patient Name: Date of Service: ?HO [place], [name] 11/02/2021 10:30 A M ?Medical Record Number: [id number] ?Patient Account Number: [id number] ?Date of Birth/Sex: Treating RN: ?[birth year] (66 y.o. 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, unspecified 09/03/2021 No Yes ?N95.0 Postmenopausal bleeding 09/03/2021 No Yes ?D70.9 Neutropenia, unspecified 09/03/2021 No Yes ?E11.9 Type 2 diabetes mellitus without complications 04/06/7472 No Yes ?L59.8 Other specified disorders of the skin and subcutaneous tissue related to 11/02/2021 No Yes ?radiation ?Inactive Problems ?Resolved Problems ?Electronic Signature(s) ?Signed: 11/02/2021 1:50:34 PM By: [name] CHT EMT BS ?, , ?Signed: 11/02/2021 3:49:36 PM By: [name] DO ?Entered By: [name] on 11/02/2021 13:50:34 ?-------------------------------------------------------------------------------- ?SuperBill Details ?Patient Name: ?Date of Service: ?[name] 11/02/2021 ?Medical Record Number: [id number] ?Patient Account Number: [id number] ?Date of Birth/Sex: ?Treating RN: ?August 06, 1955 (66 y.o. 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 ?Facility Procedures ?CPT4 Code: 83818403 ?Description: G0277-(Facility Use Only) HBOT full body chamber, 13mn , ICD-10 Diagnosis Description 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 C54.9  ?Malignant neoplasm of corpus uteri, unspecified N95.0 Postmenopausal bleeding ?Modifier: n ?Quantity: 4 ?Physician Procedures ?: CPT4 Code Description Modifier [id number] 77034- WC PHYS HYPERBARIC OXYGEN THERAPY ICD-10 Diagnosis Description 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

## 2021-11-02 NOTE — Progress Notes (Addendum)
[name], [name] ([id number]) ?Visit Report for 11/02/2021 ?[name] Details ?Patient Name: Date of Service: ?HO [place], [name] 11/02/2021 10:30 A M ?Medical Record Number: [id number] ?Patient Account Number: [id number] ?Date of Birth/Sex: Treating RN: ?10-25-1955 (66 y.o. 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Treatment Segments: 4 ?Vital Signs ?Capillary Blood Glucose Reference Range: 80 - 120 mg / dl ?[name] Diabetic Blood Glucose Intervention Range: <131 mg/dl or >249 mg/dl ?Time Vitals Blood Respiratory Capillary Blood Glucose Pulse Action ?Type: ?Pulse: Temperature: ?Taken: ?Pressure: ?Rate: ?Glucose (mg/dl): ?Meter #: Oximetry (%) Taken: ?Pre 10:08 134/53 77 18 98.5 209 ?Post 12:37 95/50 63 18 98.2 151 ?Treatment Response ?Treatment Toleration: Well ?Treatment Completion Status: Treatment Completed without Adverse Event ?Treatment Notes ?Prior to treatment patient stated that she took her Granix prescription which makes her feel "bad" usually  during the first day after taking the medication. ?Patient tolerated treatment well today with no complaints/issues. ?The patient stated that she felt fine after her treatment today. Patient left prior to a secondary manual BP measurement. Reported post-treatment BP to Dr. ?[name]. Called patient to make sure she felt okay. She stated she was at home, not driving, felt "a little off" because of her Granix medication that she took, ?but otherwise was fine. [name] ?Physician [name] Attestation: ?I certify that I supervised this [name] treatment in accordance with Medicare ?guidelines. A trained emergency response team is readily available per Yes ?hospital policies and procedures. ?Continue HBOT as ordered. Yes ?Electronic Signature(s) ?Signed: 11/02/2021 3:49:36 PM By: [name] Shan DO ?Previous Signature: 11/02/2021 1:49:49 PM Version By: [name] CHT EMT BS ?, , ?Previous Signature: 11/02/2021 1:39:29 PM Version By: [name] EMT ?Previous Signature: 11/02/2021 1:36:35 PM Version By: [name] EMT ?Previous Signature: 11/02/2021 10:53:38 AM Version By: [name] Shan DO ?Previous Signature: 11/02/2021 1:28:10 PM Version By: [name] CHT EMT BS ?, , ?Entered By: [name] on 11/02/2021 15:47:53 ?-------------------------------------------------------------------------------- ?[name] Safety Checklist Details ?Patient Name: ?Date of Service: ?[name] 11/02/2021 10:30 A M ?Medical Record Number: [id number] ?Patient Account Number: [id number] ?Date of Birth/Sex: ?Treating RN: ?[birth year] (66 y.o. [name], [name] ?Primary Care [name]: [name] ?Other Clinician: Donavan [name] ?Referring Bryssa [name]: ?Treating [name]/[name]: [name] ?[name] ?[name]: 8 ?[name] Safety Checklist Items ?Safety Checklist ?Consent Form Signed ?Patient voided / foley secured and emptied ?When did you last eato 0845 ?Last dose of injectable or oral agent 10/31/2021  Mounjaro 7 mg ?Ostomy pouch emptied and vented if applicable Urostomy connected to collection bag ?All implantable devices assessed, documented and approved Silicone Implants ?Intravenous access site secured and place ?NA ?Valuables secured ?Linens and cotton and cotton/polyester blend (less  than 51% polyester) ?Personal oil-based products / skin lotions / body lotions removed ?Wigs or hairpieces removed ?NA ?Smoking or tobacco materials removed ?NA ?Books / newspapers / magazines / loose paper removed ?Cologne, aftershave, perfume and deodorant removed ?Jewelry removed (may wrap wedding band) ?Make-up removed ?Hair care products removed ?Battery operated devices (external) removed ?Heating patches and chemical warmers removed ?Titanium eyewear removed ?NA Plastic frames ?Nail polish cured greater than 10 hours ?NA ?Casting material cured greater than 10 hours ?NA ?Hearing aids removed ?NA ?Loose dentures or partials removed ?NA ?Prosthetics have been removed ?NA ?Patient demonstrates correct use of air break device (if applicable) ?Patient concerns have been addressed ?Patient grounding bracelet on and cord attached to chamber ?Specifics for Inpatients (complete in addition to above) ?Medication sheet sent with patient ?NA ?Intravenous medications needed or due during therapy sent with patient ?NA ?Drainage tubes (e.g. nasogastric tube or chest tube secured and vented) ?NA ?Endotracheal or Tracheotomy tube secured ?NA ?Cuff deflated of air and inflated with saline ?NA ?Airway suctioned ?NA ?Notes ?Paper version used prior to treatment. ?Electronic Signature(s) ?Signed: 11/02/2021 1:28:10 PM By: [name] CHT EMT BS ?, , ?Entered By: [name] on 11/02/2021 10:38:50 ?

## 2021-11-03 ENCOUNTER — Encounter (HOSPITAL_BASED_OUTPATIENT_CLINIC_OR_DEPARTMENT_OTHER): Payer: BC Managed Care – PPO | Attending: Internal Medicine | Admitting: Internal Medicine

## 2021-11-03 DIAGNOSIS — E119 Type 2 diabetes mellitus without complications: Secondary | ICD-10-CM | POA: Diagnosis not present

## 2021-11-03 DIAGNOSIS — D709 Neutropenia, unspecified: Secondary | ICD-10-CM | POA: Insufficient documentation

## 2021-11-03 DIAGNOSIS — Z8543 Personal history of malignant neoplasm of ovary: Secondary | ICD-10-CM | POA: Diagnosis not present

## 2021-11-03 DIAGNOSIS — N3041 Irradiation cystitis with hematuria: Secondary | ICD-10-CM | POA: Diagnosis not present

## 2021-11-03 DIAGNOSIS — C549 Malignant neoplasm of corpus uteri, unspecified: Secondary | ICD-10-CM | POA: Diagnosis not present

## 2021-11-03 DIAGNOSIS — C50212 Malignant neoplasm of upper-inner quadrant of left female breast: Secondary | ICD-10-CM | POA: Diagnosis not present

## 2021-11-03 DIAGNOSIS — N950 Postmenopausal bleeding: Secondary | ICD-10-CM | POA: Insufficient documentation

## 2021-11-03 DIAGNOSIS — L59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: Secondary | ICD-10-CM | POA: Diagnosis not present

## 2021-11-03 LAB — GLUCOSE, CAPILLARY
Glucose-Capillary: 149 mg/dL — ABNORMAL HIGH (ref 70–99)
Glucose-Capillary: 116 mg/dL — ABNORMAL HIGH (ref 70–99)

## 2021-11-03 NOTE — Progress Notes (Addendum)
ANETH, SCHLAGEL ([id number]) ?Visit Report for 11/03/2021 ?Arrival Information Details ?Patient Name: Date of Service: ?[name], [name] 11/03/2021 10:00 A M ?Medical Record Number: [id number] ?Patient Account Number: [id number] ?Date of Birth/Sex: Treating RN: ?[birth year] (66 y.o. [name], [name] ?Primary Care Lion Fernandez: [name] ?Other Clinician: [name] ?Referring Trent Gabler: ?Treating Kele Withem/Extender: [name] ?[name] ?[name]: 8 ?Visit Information History Since Last Visit ?All ordered tests and consults were completed: Yes ?Patient Arrived: Ambulatory ?Added or deleted any medications: No ?Arrival Time: 09:44 ?Any new allergies or adverse reactions: No ?Accompanied By: None ?Had a fall or experienced change in No ?Transfer Assistance: None ?activities of daily living that may affect ?Patient Identification Verified: Yes ?risk of falls: ?Secondary Verification Process Completed: Yes ?Signs or symptoms of abuse/neglect since last visito No ?Patient Requires Transmission-Based Precautions: No ?Hospitalized since last visit: No ?Patient Has Alerts: Yes ?Implantable device outside of the clinic excluding No ?Patient Alerts: Patient on Blood Thinner cellular tissue based products placed in the center ?since last visit: ?Pain Present Now: No ?Electronic Signature(s) ?Signed: 11/03/2021 11:09:58 AM By: [name] EMT ?Entered By: [name] on 11/03/2021 11:09:57 ?-------------------------------------------------------------------------------- ?Encounter Discharge Information Details ?Patient Name: Date of Service: ?[name], [name] 11/03/2021 10:00 A M ?Medical Record Number: [id number] ?Patient Account Number: [id number] ?Date of Birth/Sex: Treating RN: ?[birth year] (66 y.o. [name], [name] ?Primary Care Shaniece Bussa: [name] ?Other Clinician: [name] ?Referring Sheela Mcculley: ?Treating Solon Alban/Extender: [name] ?[name] ?Weeks in  Treatment: 8 ?Encounter Discharge Information Items ?Discharge Condition: Stable ?Ambulatory Status: Ambulatory ?Discharge Destination: Home ?Transportation: Private Auto ?Accompanied By: None ?Schedule Follow-up Appointment: Yes ?Clinical Summary of Care: ?Electronic Signature(s) ?Signed: 11/03/2021 1:28:09 PM By: [name] EMT ?Entered By: [name] on 11/03/2021 13:28:08 ?-------------------------------------------------------------------------------- ?Vitals Details ?Patient Name: ?Date of Service: ?[name] 11/03/2021 10:00 A M ?Medical Record Number: [id number] ?Patient Account Number: [id number] ?Date of Birth/Sex: ?Treating RN: ?May 26, 1956 (66 y.o. [name], [name] ?Primary Care Syleena Mchan: [name] ?Other Clinician: [name] ?Referring Aceton Kinnear: ?Treating Hamdi Vari/Extender: [name] ?[name] ?[name]: 8 ?Vital Signs ?Time Taken: 10:04 ?Temperature (??F): 98.4 ?Height (in): 62 ?Pulse (bpm): 63 ?Weight (lbs): 160 ?Respiratory Rate (breaths/min): 16 ?Body Mass Index (BMI): 29.3 ?Blood Pressure (mmHg): 134/68 ?Capillary Blood Glucose (mg/dl): 149 ?Reference Range: 80 - 120 mg / dl ?Electronic Signature(s) ?Signed: 11/03/2021 11:49:36 AM By: [name] EMT ?Entered By: [name] on 11/03/2021 11:49:36 ?

## 2021-11-03 NOTE — Progress Notes (Addendum)
[name], [name] ([id number]) ?Visit Report for 11/03/2021 ?[name] Details ?Patient Name: Date of Service: ?[name], [name] 11/03/2021 10:00 A M ?Medical Record Number: [id number] ?Patient Account Number: [id number] ?Date of Birth/Sex: Treating RN: ?June 21, 1956 (66 y.o. 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Treatment Segments: 4 ?Vital Signs ?Capillary Blood Glucose Reference Range: 80 - 120 mg / dl ?[name] Diabetic Blood Glucose Intervention Range: <131 mg/dl or >249 mg/dl ?Time Vitals Blood Respiratory Capillary Blood Glucose Pulse Action ?Type: ?Pulse: Temperature: ?Taken: ?Pressure: ?Rate: ?Glucose (mg/dl): ?Meter #: Oximetry (%) Taken: ?Pre 10:04 134/68 63 16 98.4 149 ?Post 12:29 128/60 60 16 97.9 116 ?Treatment Response ?Treatment Toleration: Well ?Treatment Completion Status: Treatment Completed without Adverse Event ?Treatment Notes ?I asked the patient how she felt today. She is better today. I asked her if she was having any problems with her  ears or sinus. She stated that she was a little ?stuffy. She had used Afrin before coming today. The patient did not have any problem with today's treatment. ?Physician [name] Attestation: ?I certify that I supervised this [name] treatment in accordance with Medicare ?guidelines. A trained emergency response team is readily available per Yes ?hospital policies and procedures. ?Continue HBOT as ordered. Yes ?Electronic Signature(s) ?Signed: 11/03/2021 4:16:40 PM By: [name] DO ?Previous Signature: 11/03/2021 1:26:31 PM Version By: [name] EMT ?Previous Signature: 11/03/2021 1:28:10 PM Version By: [name] DO ?Entered By: [name] on 11/03/2021 15:52:06 ?-------------------------------------------------------------------------------- ?[name] Safety Checklist Details ?Patient Name: ?Date of Service: ?[name], [name] 11/03/2021 10:00 A M ?Medical Record Number: [id number] ?Patient Account Number: [id number] ?Date of Birth/Sex: ?Treating RN: ?[birth year] (66 y.o. [name], [name] ?Primary Care Staley Lunz: [name] ?Other Clinician: [name] ?Referring Lynnda Wiersma: ?Treating Debra Colon/Extender: [name] ?[name] ?[name]: 8 ?[name] Safety Checklist Items ?Safety Checklist ?Consent Form Signed ?Patient voided / foley secured and emptied ?When did you last eato 0815 ?Last dose of injectable or oral agent Saturday 10/31/2021 ?Ostomy pouch emptied and vented if applicable Urostomy connected to collection bag ?All implantable devices assessed, documented and approved Silicone Implants ?Intravenous access site secured and place ?NA ?Valuables secured ?Linens and cotton and cotton/polyester blend (less than 51% polyester) ?Personal oil-based products / skin lotions / body lotions removed ?Wigs or hairpieces removed ?NA ?Smoking or tobacco materials removed ?Books / newspapers / magazines / loose paper removed ?Cologne, aftershave, perfume and deodorant removed ?Jewelry removed  (may wrap wedding band) ?NA ?Make-up removed ?Hair care products removed ?Battery operated devices (external) removed ?Heating patches and chemical warmers removed ?Titanium eyewear removed ?NA Plastic frames ?Nail polish cured greater than 10 hours ?NA ?Casting material cured greater than  10 hours ?NA ?Hearing aids removed ?NA ?Loose dentures or partials removed ?NA ?Prosthetics have been removed ?NA ?Patient demonstrates correct use of air break device (if applicable) ?Patient concerns have been addressed ?Patient grounding bracelet on and cord attached to chamber ?Specifics for Inpatients (complete in addition to above) ?Medication sheet sent with patient ?NA ?Intravenous medications needed or due during therapy sent with patient ?NA ?Drainage tubes (e.g. nasogastric tube or chest tube secured and vented) ?NA ?Endotracheal or Tracheotomy tube secured ?NA ?Cuff deflated of air and inflated with saline ?NA ?Airway suctioned ?NA ?Electronic Signature(s) ?Signed: 11/03/2021 11:54:03 AM By: [name] EMT ?Entered By: [name] on 11/03/2021 11:54:02 ?

## 2021-11-03 NOTE — Progress Notes (Signed)
SULA, FETTERLY ([id number]) ?Visit Report for 11/03/2021 ?Problem List Details ?Patient Name: Date of Service: ?[name], [name] 11/03/2021 10:00 A M ?Medical Record Number: [id number] ?Patient Account Number: [id number] ?Date of Birth/Sex: Treating RN: ?20-Feb-1956 (66 y.o. 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, unspecified 09/03/2021 No Yes ?N95.0 Postmenopausal bleeding 09/03/2021 No Yes ?D70.9 Neutropenia, unspecified 09/03/2021 No Yes ?E11.9 Type 2 diabetes mellitus without complications 04/04/8100 No Yes ?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 11/03/2021 ?Medical Record Number: [id number] ?Patient Account Number: [id number] ?Date of Birth/Sex: ?Treating RN: ?[birth year] (65 y.o. 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, unspecified ?N95.0 Postmenopausal bleeding ?D70.9 Neutropenia, unspecified ?E11.9 Type 2 diabetes mellitus without complications ?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 ?Facility Procedures ?CPT4 Code: 77824235 ?Description: G0277-(Facility Use Only) HBOT full body chamber, 74mn , ICD-10 Diagnosis Description 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 C54.9  ?Malignant neoplasm of corpus uteri, unspecified N95.0 Postmenopausal bleeding ?Modifier: n ?Quantity: 4 ?Physician Procedures ?: CPT4 Code Description Modifier [id number]- WC PHYS HYPERBARIC OXYGEN THERAPY ICD-10 Diagnosis Description 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

## 2021-11-04 ENCOUNTER — Encounter (HOSPITAL_BASED_OUTPATIENT_CLINIC_OR_DEPARTMENT_OTHER): Payer: BC Managed Care – PPO | Admitting: General Surgery

## 2021-11-04 DIAGNOSIS — N950 Postmenopausal bleeding: Secondary | ICD-10-CM | POA: Diagnosis not present

## 2021-11-04 DIAGNOSIS — N3041 Irradiation cystitis with hematuria: Secondary | ICD-10-CM | POA: Diagnosis not present

## 2021-11-04 DIAGNOSIS — C549 Malignant neoplasm of corpus uteri, unspecified: Secondary | ICD-10-CM | POA: Diagnosis not present

## 2021-11-04 DIAGNOSIS — Z8543 Personal history of malignant neoplasm of ovary: Secondary | ICD-10-CM | POA: Diagnosis not present

## 2021-11-04 DIAGNOSIS — D709 Neutropenia, unspecified: Secondary | ICD-10-CM | POA: Diagnosis not present

## 2021-11-04 DIAGNOSIS — L59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: Secondary | ICD-10-CM | POA: Diagnosis not present

## 2021-11-04 DIAGNOSIS — E119 Type 2 diabetes mellitus without complications: Secondary | ICD-10-CM | POA: Diagnosis not present

## 2021-11-04 DIAGNOSIS — C50212 Malignant neoplasm of upper-inner quadrant of left female breast: Secondary | ICD-10-CM | POA: Diagnosis not present

## 2021-11-04 LAB — GLUCOSE, CAPILLARY
Glucose-Capillary: 144 mg/dL — ABNORMAL HIGH (ref 70–99)
Glucose-Capillary: 106 mg/dL — ABNORMAL HIGH (ref 70–99)

## 2021-11-04 NOTE — Progress Notes (Addendum)
[name], [name] ([id number]) ?Visit Report for 11/04/2021 ?Arrival Information Details ?Patient Name: Date of Service: ?[name], [name] 11/04/2021 10:00 A M ?Medical Record Number: [id number] ?Patient Account Number: [id number] ?Date of Birth/Sex: Treating RN: ?Dec 11, 1955 (66 y.o. [name], [name] ?Primary Care Slater Mcmanaman: [name] ?Other Clinician: [name] ?Referring Culver Feighner: ?Treating [name] Arrambide/Extender: [name] ?[name] ?[name]: 8 ?Visit Information History Since Last Visit ?All ordered tests and consults were completed: Yes ?Patient Arrived: Ambulatory ?Added or deleted any medications: No ?Arrival Time: 09:48 ?Any new allergies or adverse reactions: No ?Accompanied By: None ?Had a fall or experienced change in No ?Transfer Assistance: None ?activities of daily living that may affect ?Patient Identification Verified: Yes ?risk of falls: ?Secondary Verification Process Completed: Yes ?Signs or symptoms of abuse/neglect since last visito No ?Patient Requires Transmission-Based Precautions: No ?Hospitalized since last visit: No ?Patient Has Alerts: Yes ?Implantable device outside of the clinic excluding No ?Patient Alerts: Patient on Blood Thinner cellular tissue based products placed in the center ?since last visit: ?Pain Present Now: Yes ?Electronic Signature(s) ?Signed: 11/04/2021 10:36:43 AM By: [name] EMT ?Entered By: [name] on 11/04/2021 10:36:43 ?-------------------------------------------------------------------------------- ?Encounter Discharge Information Details ?Patient Name: Date of Service: ?[name], [name] 11/04/2021 10:00 A M ?Medical Record Number: [id number] ?Patient Account Number: [id number] ?Date of Birth/Sex: Treating RN: ?10/29/1955 (66 y.o. [name], [name] ?Primary Care Ignatz Deis: [name] ?Other Clinician: [name] ?Referring Tnia Anglada: ?Treating Jaykob Minichiello/Extender: [name] ?[name] ?Weeks in  Treatment: 8 ?Encounter Discharge Information Items ?Discharge Condition: Stable ?Ambulatory Status: Ambulatory ?Discharge Destination: Home ?Transportation: Private Auto ?Accompanied By: self ?Schedule Follow-up Appointment: No ?Clinical Summary of Care: ?Electronic Signature(s) ?Signed: 11/04/2021 12:57:19 PM By: [name] CHT EMT BS ?, , ?Entered By: [name] on 11/04/2021 12:56:33 ?-------------------------------------------------------------------------------- ?Vitals Details ?Patient Name: ?Date of Service: ?[name] [name], [name] [name] 11/04/2021 10:00 A M ?Medical Record Number: [id number] ?Patient Account Number: [id number] ?Date of Birth/Sex: ?Treating RN: ?[birth year] (66 y.o. [name], [name] ?Primary Care Aliea Bobe: [name] ?Other Clinician: [name] ?Referring Nicklous Aburto: ?Treating Fate Galanti/Extender: [name] ?[name] ?[name]: 8 ?Vital Signs ?Time Taken: 09:48 ?Temperature (??F): 98.0 ?Height (in): 62 ?Pulse (bpm): 65 ?Weight (lbs): 160 ?Respiratory Rate (breaths/min): 16 ?Body Mass Index (BMI): 29.3 ?Blood Pressure (mmHg): 134/76 ?Capillary Blood Glucose (mg/dl): 144 ?Reference Range: 80 - 120 mg / dl ?Airway ?Pulse Oximetry (%): 99 ?Electronic Signature(s) ?Signed: 11/04/2021 10:37:23 AM By: [name] EMT ?Entered By: [name] on 11/04/2021 10:37:23 ?

## 2021-11-04 NOTE — Progress Notes (Signed)
[name], [name] ([id number]) ?Visit Report for 11/04/2021 ?Problem List Details ?Patient Name: Date of Service: ?[name], [name] 11/04/2021 10:00 A M ?Medical Record Number: [id number] ?Patient Account Number: [id number] ?Date of Birth/Sex: Treating RN: ?1956/01/22 (66 y.o. 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, unspecified 09/03/2021 No Yes ?N95.0 Postmenopausal bleeding 09/03/2021 No Yes ?D70.9 Neutropenia, unspecified 09/03/2021 No Yes ?E11.9 Type 2 diabetes mellitus without complications 11/06/960 No Yes ?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 11/04/2021 ?Medical Record Number: [id number] ?Patient Account Number: [id number] ?Date of Birth/Sex: ?Treating RN: ?03-05-1956 (66 y.o. 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 ?Facility Procedures ?CPT4 Code: 54650354 ?Description: G0277-(Facility Use Only) HBOT full body chamber, 16mn , ICD-10 Diagnosis Description 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 C54.9  ?Malignant neoplasm of corpus uteri, unspecified N95.0 Postmenopausal bleeding ?Modifier: n ?Quantity: 4 ?Physician Procedures ?: CPT4 Code Description Modifier [id number] 51700- WC PHYS HYPERBARIC OXYGEN THERAPY ICD-10 Diagnosis Description 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

## 2021-11-04 NOTE — Progress Notes (Addendum)
[name], [name] ([id number]) ?Visit Report for 11/04/2021 ?[name] Details ?Patient Name: Date of Service: ?[name], [name] 11/04/2021 10:00 A M ?Medical Record Number: [id number] ?Patient Account Number: [id number] ?Date of Birth/Sex: Treating RN: ?1956-07-17 (66 y.o. 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Treatment Segments: 4 ?Vital Signs ?Capillary Blood Glucose Reference Range: 80 - 120 mg / dl ?[name] Diabetic Blood Glucose Intervention Range: <131 mg/dl or >249 mg/dl ?Type: Time Vitals Blood ?Pulse: Respiratory Capillary Blood Glucose Pulse Action ?Temperature: ?Taken: ?Pressure: ?Rate: ?Glucose (mg/dl): ?Meter #: Oximetry (%) Taken: ?Pre 09:48 134/76 65 16 98 144 99 ?Post 12:41 135/76 61 16 97.7 106 >101 mg/dL, discharge ?Treatment Response ?Treatment Toleration: Well ?Treatment Completion Status: Treatment Completed without Adverse Event ?Treatment Notes ?I asked the patient if she had any problems today. She stated that she had some pain under  her stoma area 3 out of 10. She was able to eat this morning. ?Still passing stool. --JC ?Physician [name] Attestation: ?I certify that I supervised this [name] treatment in accordance with Medicare ?guidelines. A trained emergency response team is readily available per Yes ?hospital policies and procedures. ?Continue HBOT as ordered. Yes ?Electronic Signature(s) ?Signed: 11/04/2021 4:38:06 PM By: [name] Maudlin MD FACS ?Previous Signature: 11/04/2021 12:57:19 PM Version By: [name] CHT EMT BS ?, , ?Previous Signature: 11/04/2021 12:20:25 PM Version By: [name] EMT ?Previous Signature: 11/04/2021 12:42:55 PM Version By: [name] Maudlin MD FACS ?Previous Signature: 11/04/2021 11:51:49 AM Version By: [name] EMT ?Entered By: [name] on 11/04/2021 16:38:06 ?-------------------------------------------------------------------------------- ?[name] Safety Checklist Details ?Patient Name: ?Date of Service: ?[name], [name] 11/04/2021 10:00 A M ?Medical Record Number: [id number] ?Patient Account Number: [id number] ?Date of Birth/Sex: ?Treating RN: ?22-Aug-1955 (66 y.o. [name], [name] ?Primary Care [name]: [name] ?Other Clinician: [name] ?Referring [name]: ?Treating [name]/Extender: [name] ?[name] ?[name]: 8 ?[name] Safety Checklist Items ?Safety Checklist ?Consent Form Signed ?Patient voided / foley secured and emptied ?When did you last eato 0845 ?Last dose of injectable or oral agent Saturday 10/31/2021 ?Ostomy pouch emptied and vented if applicable ?All implantable devices assessed, documented and approved Silicone Implants ?Intravenous access site secured and place ?NA ?Valuables secured ?Linens and cotton and cotton/polyester blend (less than 51% polyester) ?Personal oil-based products / skin lotions / body lotions removed ?Wigs or hairpieces removed ?NA ?Smoking or tobacco materials removed ?Books / newspapers / magazines / loose paper  removed ?Cologne, aftershave, perfume and deodorant removed ?Jewelry removed (may wrap wedding band) ?NA ?Make-up removed ?Hair care products removed ?Battery operated devices (external) removed ?Heating patches and chemical warmers removed ?Titanium eyewear removed ?NA Plastic frames ?Nail polish cured  greater than 10 hours ?NA ?Casting material cured greater than 10 hours ?NA ?Hearing aids removed ?NA ?Loose dentures or partials removed ?NA ?Prosthetics have been removed ?NA ?Patient demonstrates correct use of air break device (if applicable) ?Patient concerns have been addressed ?Patient grounding bracelet on and cord attached to chamber ?Specifics for Inpatients (complete in addition to above) ?Medication sheet sent with patient ?NA ?Intravenous medications needed or due during therapy sent with patient ?NA ?Drainage tubes (e.g. nasogastric tube or chest tube secured and vented) ?NA ?Endotracheal or Tracheotomy tube secured ?NA ?Cuff deflated of air and inflated with saline ?NA ?Airway suctioned ?NA ?Electronic Signature(s) ?Signed: 11/04/2021 10:39:43 AM By: [name] EMT ?Entered By: [name] on 11/04/2021 10:39:43 ?

## 2021-11-05 ENCOUNTER — Ambulatory Visit (INDEPENDENT_AMBULATORY_CARE_PROVIDER_SITE_OTHER): Payer: BC Managed Care – PPO | Admitting: Nurse Practitioner

## 2021-11-05 ENCOUNTER — Other Ambulatory Visit: Payer: Self-pay | Admitting: Interventional Cardiology

## 2021-11-05 ENCOUNTER — Encounter (INDEPENDENT_AMBULATORY_CARE_PROVIDER_SITE_OTHER): Payer: Self-pay | Admitting: Nurse Practitioner

## 2021-11-05 ENCOUNTER — Encounter (HOSPITAL_BASED_OUTPATIENT_CLINIC_OR_DEPARTMENT_OTHER): Payer: BC Managed Care – PPO | Admitting: General Surgery

## 2021-11-05 VITALS — BP 122/70 | HR 59 | Temp 97.4°F | Ht 62.0 in | Wt 221.0 lb

## 2021-11-05 DIAGNOSIS — E1122 Type 2 diabetes mellitus with diabetic chronic kidney disease: Secondary | ICD-10-CM

## 2021-11-05 DIAGNOSIS — E669 Obesity, unspecified: Secondary | ICD-10-CM

## 2021-11-05 DIAGNOSIS — I480 Paroxysmal atrial fibrillation: Secondary | ICD-10-CM

## 2021-11-05 DIAGNOSIS — C50212 Malignant neoplasm of upper-inner quadrant of left female breast: Secondary | ICD-10-CM | POA: Diagnosis not present

## 2021-11-05 DIAGNOSIS — F3289 Other specified depressive episodes: Secondary | ICD-10-CM

## 2021-11-05 DIAGNOSIS — N3041 Irradiation cystitis with hematuria: Secondary | ICD-10-CM | POA: Diagnosis not present

## 2021-11-05 DIAGNOSIS — N184 Chronic kidney disease, stage 4 (severe): Secondary | ICD-10-CM

## 2021-11-05 DIAGNOSIS — C549 Malignant neoplasm of corpus uteri, unspecified: Secondary | ICD-10-CM | POA: Diagnosis not present

## 2021-11-05 DIAGNOSIS — Z7985 Long-term (current) use of injectable non-insulin antidiabetic drugs: Secondary | ICD-10-CM

## 2021-11-05 DIAGNOSIS — Z8543 Personal history of malignant neoplasm of ovary: Secondary | ICD-10-CM | POA: Diagnosis not present

## 2021-11-05 DIAGNOSIS — N950 Postmenopausal bleeding: Secondary | ICD-10-CM | POA: Diagnosis not present

## 2021-11-05 DIAGNOSIS — E119 Type 2 diabetes mellitus without complications: Secondary | ICD-10-CM | POA: Diagnosis not present

## 2021-11-05 DIAGNOSIS — L59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: Secondary | ICD-10-CM | POA: Diagnosis not present

## 2021-11-05 DIAGNOSIS — D709 Neutropenia, unspecified: Secondary | ICD-10-CM | POA: Diagnosis not present

## 2021-11-05 DIAGNOSIS — Z6841 Body Mass Index (BMI) 40.0 and over, adult: Secondary | ICD-10-CM

## 2021-11-05 LAB — GLUCOSE, CAPILLARY
Glucose-Capillary: 155 mg/dL — ABNORMAL HIGH (ref 70–99)
Glucose-Capillary: 156 mg/dL — ABNORMAL HIGH (ref 70–99)

## 2021-11-05 NOTE — Telephone Encounter (Signed)
Eliquis '5mg'$  refill request received. Patient is 66 years old, weight-98.9kg, Crea-1.60 on 08/19/2021, Diagnosis-Afib, and last seen by Dr. Tamala Julian on 07/10/2021. Dose is appropriate based on dosing criteria. Will send in refill to requested pharmacy.   ?

## 2021-11-05 NOTE — Progress Notes (Signed)
[name], [name] ([id number]) ?Visit Report for 11/05/2021 ?Problem List Details ?Patient Name: Date of Service: ?[name], [name] 11/05/2021 10:00 A M ?Medical Record Number: [id number] ?Patient Account Number: [id number] ?Date of Birth/Sex: Treating RN: ?November 17, 1955 (66 y.o. 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, unspecified 09/03/2021 No Yes ?N95.0 Postmenopausal bleeding 09/03/2021 No Yes ?D70.9 Neutropenia, unspecified 09/03/2021 No Yes ?E11.9 Type 2 diabetes mellitus without complications 09/04/6331 No Yes ?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 11/05/2021 ?Medical Record Number: [id number] ?Patient Account Number: [id number] ?Date of Birth/Sex: ?Treating RN: ?01/02/1956 (65 y.o. 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 ?Facility Procedures ?CPT4 Code: 93734287 ?Description: G0277-(Facility Use Only) HBOT full body chamber, 47mn , ICD-10 Diagnosis Description 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 C54.9  ?Malignant neoplasm of corpus uteri, unspecified N95.0 Postmenopausal bleeding ?Modifier: n ?Quantity: 4 ?Physician Procedures ?: CPT4 Code Description Modifier [id number]- WC PHYS HYPERBARIC OXYGEN THERAPY ICD-10 Diagnosis Description 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

## 2021-11-05 NOTE — Progress Notes (Signed)
[name], [name] ([id number]) ?Visit Report for 11/05/2021 ?Arrival Information Details ?Patient Name: Date of Service: ?[name], [name] [name] B. 11/05/2021 10:00 A M ?Medical Record Number: [id number] ?Patient Account Number: [id number] ?Date of Birth/Sex: Treating RN: ?[birth year] (66 y.o. F) [name], [name] ?Primary Care Ozell Juhasz: [name] ?Other Clinician: [name] ?Referring Crislyn Willbanks: ?Treating Jaquavius Hudler/Extender: [name] ?[name] ?[name]: 9 ?Visit Information History Since Last Visit ?All ordered tests and consults were completed: Yes ?Patient Arrived: Ambulatory ?Added or deleted any medications: No ?Arrival Time: 09:46 ?Any new allergies or adverse reactions: No ?Accompanied By: self ?Had a fall or experienced change in No ?Transfer Assistance: None ?activities of daily living that may affect ?Patient Identification Verified: Yes ?risk of falls: ?Secondary Verification Process Completed: Yes ?Signs or symptoms of abuse/neglect since last visito No ?Patient Requires Transmission-Based Precautions: No ?Hospitalized since last visit: No ?Patient Has Alerts: Yes ?Implantable device outside of the clinic excluding No ?Patient Alerts: Patient on Blood Thinner cellular tissue based products placed in the center ?since last visit: ?Pain Present Now: No ?Electronic Signature(s) ?Signed: 11/05/2021 3:40:39 PM By: [name] CHT EMT BS ?, , ?Entered By: [name] on 11/05/2021 10:23:51 ?-------------------------------------------------------------------------------- ?Encounter Discharge Information Details ?Patient Name: Date of Service: ?[name], [name] [name] B. 11/05/2021 10:00 A M ?Medical Record Number: [id number] ?Patient Account Number: [id number] ?Date of Birth/Sex: Treating RN: ?[birth year] (66 y.o. F) [name], [name] ?Primary Care Roylene Heaton: [name] ?Other Clinician: [name] ?Referring Miniya Miguez: ?Treating Marlinda Miranda/Extender: [name] ?[name] ?[name]: 9 ?Encounter Discharge Information Items ?Discharge Condition: Stable ?Ambulatory Status: Ambulatory ?Discharge Destination: Home ?Transportation: Private Auto ?Accompanied By: self ?Schedule Follow-up Appointment: No ?Clinical Summary of Care: ?Electronic Signature(s) ?Signed: 11/05/2021 3:40:39 PM By: [name] CHT EMT BS ?, , ?Entered By: [name] on 11/05/2021 13:44:14 ?-------------------------------------------------------------------------------- ?Vitals Details ?Patient Name: ?Date of Service: ?[name] [name], [name] 11/05/2021 10:00 A M ?Medical Record Number: [id number] ?Patient Account Number: [id number] ?Date of Birth/Sex: ?Treating RN: ?07-11-1956 (66 y.o. F) [name], [name] ?Primary Care Haze Antillon: [name] ?Other Clinician: [name] ?Referring Courvoisier Hamblen: ?Treating Lathyn Griggs/Extender: [name] ?[name] ?[name]: 9 ?Vital Signs ?Time Taken: 10:03 ?Temperature (??F): 97.9 ?Height (in): 62 ?Pulse (bpm): 63 ?Weight (lbs): 160 ?Respiratory Rate (breaths/min): 18 ?Body Mass Index (BMI): 29.3 ?Blood Pressure (mmHg): 136/71 ?Capillary Blood Glucose (mg/dl): 155 ?Reference Range: 80 - 120 mg / dl ?Electronic Signature(s) ?Signed: 11/05/2021 3:40:39 PM By: [name] CHT EMT BS ?, , ?Entered By: [name] on 11/05/2021 10:24:23 ?

## 2021-11-05 NOTE — Progress Notes (Addendum)
[name], [name] ([id number]) ?Visit Report for 11/05/2021 ?[name] Details ?Patient Name: Date of Service: ?[name], [name] 11/05/2021 10:00 A M ?Medical Record Number: [id number] ?Patient Account Number: [id number] ?Date of Birth/Sex: Treating RN: ?07-Apr-1956 (66 y.o. 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Treatment Segments: 4 ?Vital Signs ?Capillary Blood Glucose Reference Range: 80 - 120 mg / dl ?[name] Diabetic Blood Glucose Intervention Range: <131 mg/dl or >249 mg/dl ?Time Vitals Blood Respiratory Capillary Blood Glucose Pulse Action ?Type: ?Pulse: Temperature: ?Taken: ?Pressure: ?Rate: ?Glucose (mg/dl): ?Meter #: Oximetry (%) Taken: ?Pre 10:03 136/71 63 18 97.9 155 2 ?Post 12:38 150/67 59 18 97.9 156 2 ?Treatment Response ?Treatment Toleration: Well ?Treatment Completion Status: Treatment Completed without Adverse Event ?Physician [name] Attestation: ?I certify that I supervised this [name] treatment in accordance with Medicare ?guidelines. A trained  emergency response team is readily available per Yes ?hospital policies and procedures. ?Continue HBOT as ordered. Yes ?Electronic Signature(s) ?Signed: 11/05/2021 5:15:33 PM By: [name] Maudlin MD FACS ?Previous Signature: 11/05/2021 1:32:44 PM Version By: [name] CHT EMT BS ?, , ?Entered By: [name] on 11/05/2021 17:15:33 ?-------------------------------------------------------------------------------- ?[name] Safety Checklist Details ?Patient Name: ?Date of Service: ?[name], [name] 11/05/2021 10:00 A M ?Medical Record Number: [id number] ?Patient Account Number: [id number] ?Date of Birth/Sex: ?Treating RN: ?Mar 26, 1956 (66 y.o. F) [name], [name] ?Primary Care [name]: [name] ?Other Clinician: [name] ?Referring [name]: ?Treating Zeno Hickel/Extender: [name] ?[name] ?[name]: 9 ?[name] Safety Checklist Items ?Safety Checklist ?Consent Form Signed ?Patient voided / foley secured and emptied ?When did you last eato 0815 ?Last dose of injectable or oral agent 10/31/2021 Mounjaro 7 mg ?Ostomy pouch emptied and vented if applicable Urostomy connected to collection bag. ?All implantable devices assessed, documented and approved Silicone Implants ?Intravenous access site secured and place ?NA ?Valuables secured ?Linens and cotton and cotton/polyester blend (less than 51% polyester) ?Personal oil-based products / skin lotions / body lotions removed ?Wigs or hairpieces removed ?NA ?Smoking or tobacco materials removed ?NA ?Books / newspapers / magazines / loose paper removed ?Cologne, aftershave, perfume and deodorant removed ?Jewelry removed (may wrap wedding band) ?Make-up removed ?Hair care products removed ?Battery operated devices (external) removed ?Heating patches and chemical warmers removed ?Titanium eyewear removed ?NA Plastic frames ?Nail polish cured greater than 10 hours ?NA ?Casting material cured greater than 10 hours ?NA ?Hearing aids  removed ?NA ?Loose dentures or partials removed ?NA ?Prosthetics have been removed ?NA ?Patient demonstrates correct use of air break device (if applicable) ?Patient concerns have been addressed ?Patient grounding bracelet on and cord attached to chamber ?Specifics for Inpatients (complete in addition to above) ?Medication sheet sent with  patient ?NA ?Intravenous medications needed or due during therapy sent with patient ?NA ?Drainage tubes (e.g. nasogastric tube or chest tube secured and vented) ?NA ?Endotracheal or Tracheotomy tube secured ?NA ?Cuff deflated of air and inflated with saline ?NA ?Airway suctioned ?NA ?Notes ?Paper version used prior to treatment. ?Electronic Signature(s) ?Signed: 11/06/2021 10:49:49 AM By: [name] CHT EMT BS ?, , ?Previous Signature: 11/05/2021 10:29:33 AM Version By: [name] CHT EMT BS ?, , ?Entered By: [name] on 11/06/2021 10:49:16 ?

## 2021-11-06 ENCOUNTER — Encounter (HOSPITAL_BASED_OUTPATIENT_CLINIC_OR_DEPARTMENT_OTHER): Payer: BC Managed Care – PPO | Admitting: General Surgery

## 2021-11-06 DIAGNOSIS — C549 Malignant neoplasm of corpus uteri, unspecified: Secondary | ICD-10-CM | POA: Diagnosis not present

## 2021-11-06 DIAGNOSIS — D709 Neutropenia, unspecified: Secondary | ICD-10-CM | POA: Diagnosis not present

## 2021-11-06 DIAGNOSIS — N950 Postmenopausal bleeding: Secondary | ICD-10-CM | POA: Diagnosis not present

## 2021-11-06 DIAGNOSIS — E119 Type 2 diabetes mellitus without complications: Secondary | ICD-10-CM | POA: Diagnosis not present

## 2021-11-06 DIAGNOSIS — N3041 Irradiation cystitis with hematuria: Secondary | ICD-10-CM | POA: Diagnosis not present

## 2021-11-06 DIAGNOSIS — Z8543 Personal history of malignant neoplasm of ovary: Secondary | ICD-10-CM | POA: Diagnosis not present

## 2021-11-06 DIAGNOSIS — C50212 Malignant neoplasm of upper-inner quadrant of left female breast: Secondary | ICD-10-CM | POA: Diagnosis not present

## 2021-11-06 DIAGNOSIS — L59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: Secondary | ICD-10-CM | POA: Diagnosis not present

## 2021-11-06 LAB — GLUCOSE, CAPILLARY
Glucose-Capillary: 149 mg/dL — ABNORMAL HIGH (ref 70–99)
Glucose-Capillary: 118 mg/dL — ABNORMAL HIGH (ref 70–99)

## 2021-11-06 NOTE — Progress Notes (Signed)
[name], [name] ([id number]) ?Visit Report for 11/06/2021 ?Problem List Details ?Patient Name: Date of Service: ?HO [place], [name] 11/06/2021 9:30 A M ?Medical Record Number: [id number] ?Patient Account Number: [id number] ?Date of Birth/Sex: Treating RN: ?01-Jan-1956 (66 y.o. 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, unspecified 09/03/2021 No Yes ?N95.0 Postmenopausal bleeding 09/03/2021 No Yes ?D70.9 Neutropenia, unspecified 09/03/2021 No Yes ?E11.9 Type 2 diabetes mellitus without complications 11/05/386 No Yes ?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 11/06/2021 ?Medical Record Number: [id number] ?Patient Account Number: [id number] ?Date of Birth/Sex: ?Treating RN: ?[birth year] (66 y.o. 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 ?Facility Procedures ?CPT4 Code: 79150569 ?Description: G0277-(Facility Use Only) HBOT full body chamber, 43mn , ICD-10 Diagnosis Description 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 C54.9  ?Malignant neoplasm of corpus uteri, unspecified N95.0 Postmenopausal bleeding ?Modifier: n ?Quantity: 4 ?Physician Procedures ?: CPT4 Code Description Modifier [id number]- WC PHYS HYPERBARIC OXYGEN THERAPY ICD-10 Diagnosis Description 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

## 2021-11-06 NOTE — Progress Notes (Addendum)
[name], [name] ([id number]) ?Visit Report for 11/06/2021 ?[name] Details ?Patient Name: Date of Service: ?HO [place], [name] 11/06/2021 9:30 A M ?Medical Record Number: [id number] ?Patient Account Number: [id number] ?Date of Birth/Sex: Treating RN: ?Jan 06, 1956 (66 y.o. 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Treatment Segments: 4 ?Vital Signs ?Capillary Blood Glucose Reference Range: 80 - 120 mg / dl ?[name] Diabetic Blood Glucose Intervention Range: <131 mg/dl or >249 mg/dl ?Type: Time Vitals Blood Pulse: Respiratory Temperature: Capillary Blood Glucose Pulse Action ?Taken: ?Pressure: ?Rate: ?Glucose (mg/dl): Meter #: Oximetry (%) Taken: ?Pre 09:33 138/56 64 18 98.4 149 Alerted physician about BP ?Post 12:08 144/73 59 18 97.9 118 > 101 mg/dL discharge per protocol ?Treatment Response ?Treatment Toleration: Well ?Treatment Completion Status: Treatment Completed without Adverse Event ?Physician [name] Attestation: ?I certify that I supervised this [name]  treatment in accordance with Medicare ?guidelines. A trained emergency response team is readily available per Yes ?hospital policies and procedures. ?Continue HBOT as ordered. Yes ?Electronic Signature(s) ?Signed: 11/06/2021 4:20:06 PM By: [name] MD FACS ?Entered By: [name] on 11/06/2021 16:20:06 ?-------------------------------------------------------------------------------- ?[name] Safety Checklist Details ?Patient Name: ?Date of Service: ?[name], [name] 11/06/2021 9:30 A M ?Medical Record Number: [id number] ?Patient Account Number: [id number] ?Date of Birth/Sex: ?Treating RN: ?04-27-1956 (67 y.o. F) [name] ?Primary Care Jael Kostick: [name] ?Other Clinician: [name] ?Referring Collen Vincent: ?Treating Jamyiah Labella/Extender: [name] ?[name] ?[name]: 9 ?[name] Safety Checklist Items ?Safety Checklist ?Consent Form Signed ?Patient voided / foley secured and emptied ?When did you last eato 0845 ?Last dose of injectable or oral agent 10/31/2021 Mounjaro 7 mg ?Ostomy pouch emptied and vented if applicable Urostomy connected to collection bag. ?All implantable devices assessed, documented and approved Silicone Implants ?Intravenous access site secured and place ?NA ?Valuables secured ?Linens and cotton and cotton/polyester blend (less than 51% polyester) ?Personal oil-based products / skin lotions / body lotions removed ?Wigs or hairpieces removed ?NA ?Smoking or tobacco materials removed ?NA ?Books / newspapers / magazines / loose paper removed ?Cologne, aftershave, perfume and deodorant removed ?Jewelry removed (may wrap wedding band) ?Make-up removed ?Hair care products removed ?Battery operated devices (external) removed ?Heating patches and chemical warmers removed ?Titanium eyewear removed ?NA Plastic Frames ?Nail polish cured greater than 10 hours ?NA ?Casting material cured greater than 10 hours ?NA ?Hearing aids removed ?NA ?Loose dentures or  partials removed ?NA ?Prosthetics have been removed ?NA ?Patient demonstrates correct use of air break device (if applicable) ?Patient concerns have been addressed ?Patient grounding bracelet on and cord attached to chamber ?Specifics for Inpatients (complete in addition to above) ?Medication sheet sent with patient ?NA ?Intravenous medications needed or  due during therapy sent with patient ?NA ?Drainage tubes (e.g. nasogastric tube or chest tube secured and vented) ?NA ?Endotracheal or Tracheotomy tube secured ?NA ?Cuff deflated of air and inflated with saline ?NA ?Airway suctioned ?NA ?Notes ?Paper version used prior to treatment. ?Electronic Signature(s) ?Signed: 11/06/2021 10:49:49 AM By: [name] CHT EMT BS ?, , ?Entered By: [name] on 11/06/2021 10:38:08 ?

## 2021-11-06 NOTE — Progress Notes (Addendum)
EDDIS, PINGLETON ([id number]) ?Visit Report for 11/06/2021 ?Arrival Information Details ?Patient Name: Date of Service: ?[name], [name] [name] B. 11/06/2021 9:30 A M ?Medical Record Number: [id number] ?Patient Account Number: [id number] ?Date of Birth/Sex: Treating RN: ?October 22, 1955 (66 y.o. F) [name], [name] ?Primary Care Glennon Kopko: [name] ?Other Clinician: [name] ?Referring Mitzi Lilja: ?Treating Abran Gavigan/Extender: [name] ?[name] ?[name]: 9 ?Visit Information History Since Last Visit ?All ordered tests and consults were completed: Yes ?Patient Arrived: Ambulatory ?Added or deleted any medications: No ?Arrival Time: 09:16 ?Any new allergies or adverse reactions: No ?Accompanied By: self ?Had a fall or experienced change in No ?Transfer Assistance: None ?activities of daily living that may affect ?Patient Identification Verified: Yes ?risk of falls: ?Secondary Verification Process Completed: Yes ?Signs or symptoms of abuse/neglect since last visito No ?Patient Requires Transmission-Based Precautions: No ?Hospitalized since last visit: No ?Patient Has Alerts: Yes ?Implantable device outside of the clinic excluding No ?Patient Alerts: Patient on Blood Thinner cellular tissue based products placed in the center ?since last visit: ?Pain Present Now: No ?Electronic Signature(s) ?Signed: 11/06/2021 10:49:49 AM By: [name] CHT EMT BS ?, , ?Entered By: [name] on 11/06/2021 10:32:49 ?-------------------------------------------------------------------------------- ?Encounter Discharge Information Details ?Patient Name: Date of Service: ?[name], [name] [name] B. 11/06/2021 9:30 A M ?Medical Record Number: [id number] ?Patient Account Number: [id number] ?Date of Birth/Sex: Treating RN: ?1955/10/17 (66 y.o. F) [name], [name] ?Primary Care Kabrea Seeney: [name] ?Other Clinician: [name] ?Referring [name] [name]: ?Treating Millard Bautch/Extender: [name] ?[name] ?[name]: 9 ?Encounter Discharge Information Items ?Discharge Condition: Stable ?Ambulatory Status: Ambulatory ?Discharge Destination: Home ?Transportation: Private Auto ?Accompanied By: self ?Schedule Follow-up Appointment: No ?Clinical Summary of Care: ?Electronic Signature(s) ?Signed: 11/09/2021 6:44:01 PM By: [name] CHT EMT BS ?, , ?Entered By: [name] on 11/06/2021 14:04:36 ?-------------------------------------------------------------------------------- ?Vitals Details ?Patient Name: ?Date of Service: ?[name] [name], [name] [name] B. 11/06/2021 9:30 A M ?Medical Record Number: [id number] ?Patient Account Number: [id number] ?Date of Birth/Sex: ?Treating RN: ?[birth year] (66 y.o. F) [name], [name] ?Primary Care Margarie Mcguirt: [name] ?Other Clinician: [name] ?Referring Lakeria Starkman: ?Treating Nathanael Krist/Extender: [name] ?[name] ?[name]: 9 ?Vital Signs ?Time Taken: 09:33 ?Temperature (??F): 98.4 ?Height (in): 62 ?Pulse (bpm): 64 ?Weight (lbs): 160 ?Respiratory Rate (breaths/min): 18 ?Body Mass Index (BMI): 29.3 ?Blood Pressure (mmHg): 138/56 ?Capillary Blood Glucose (mg/dl): 149 ?Reference Range: 80 - 120 mg / dl ?Electronic Signature(s) ?Signed: 11/06/2021 10:49:49 AM By: [name] CHT EMT BS ?, , ?Entered By: [name] on 11/06/2021 10:33:22 ?

## 2021-11-07 NOTE — Progress Notes (Signed)
? ? ? ?Chief Complaint:  ? ?OBESITY ?[name] is here to discuss her progress with her obesity treatment plan along with follow-up of her obesity related diagnoses. [name] is on keeping a food journal and adhering to recommended goals of 1200-1300 calories and 75 grams of protein or 1500-1800 calories and 25-30 grams of protein and states she is following her eating plan approximately 60% of the time. [name] states she is walking for 15-30 minutes 5 times per week. ? ?Today's visit was #: 57 ?Starting weight: 208 lbs ?Starting date: 04/05/2018 ?Today's weight: 221 lbs ?Today's date: 11/05/2021 ?Total lbs lost to date: 0 ?Total lbs lost since last in-office visit: 0 ? ?Interim History: [name] is frustrated with her weight gain. She is not hungry in the morning but notes hunger later in the day. She struggles with exercising due to adhesive allergies and notes they are worse with exercising. She took. doxycycline twice and notes it was helpful. She is going on vacation next week.  ? ?Subjective:  ? ?1. Type 2 diabetes mellitus with stage 4 chronic kidney disease, without long-term current use of insulin ([place]) ?[name] is currently taking Mounjaro 7.5 mg. She started 2 weeks ago. She denies side effects. Her fasting blood sugar was in the range 89-160. She is feeling bloated in the morning since starting Mounjaro. She stopped Ozempic prior to starting [facility].  ? ?2. Other depression, with emotional eating ?[name] is currently taking Wellbutrin SR 150 mg daily.She decreased to once daily due to side effects. She now questioning if the twice daily helped. She notices some hunger in the evenings.  ? ?Assessment/Plan:  ? ?1. Type 2 diabetes mellitus with stage 4 chronic kidney disease, without long-term current use of insulin ([name]) ?[name] will continue Mounjaro 7.5 mg. We discussed decreasing to 5 mg. She would like to continue current dose. Good blood sugar control is important to decrease the likelihood of diabetic complications  such as nephropathy, neuropathy, limb loss, blindness, coronary artery disease, and death. Intensive lifestyle modification including diet, exercise and weight loss are the first line of treatment for diabetes.  ? ?2. Other depression, with emotional eating ?[name] will take Wellbutrin at lunch. She is considering increasing to 200 mg. Behavior modification techniques were discussed today to help [name] deal with her emotional/non-hunger eating behaviors.  Orders and follow up as documented in patient record.  ? ?3. Obesity with current BMI of 40.5 ?[name] is currently in the action stage of change. As such, her goal is to continue with weight loss efforts. She has agreed to practicing portion control and making smarter food choices, such as increasing vegetables and decreasing simple carbohydrates.  ? ?Exercise goals:  As is.  ? ?Behavioral modification strategies: increasing lean protein intake, increasing water intake, and no skipping meals. ? ?[name] has agreed to follow-up with our clinic in 4 weeks. She was informed of the importance of frequent follow-up visits to maximize her success with intensive lifestyle modifications for her multiple health conditions.  ? ?Objective:  ? ?Blood pressure 122/70, pulse (!) 59, temperature (!) 97.4 ?F (36.3 ?C), height '5\' 2"'$  (1.575 m), weight 221 lb (100.2 kg), SpO2 99 %. ?Body mass index is 40.42 kg/m?. ? ?General: Cooperative, alert, well developed, in no acute distress. ?HEENT: Conjunctivae and lids unremarkable. ?Cardiovascular: Regular rhythm.  ?Lungs: Normal work of breathing. ?Neurologic: No focal deficits.  ? ?Lab Results  ?Component Value Date  ? CREATININE 1.60 (H) 08/19/2021  ? BUN 30 (H) 08/19/2021  ? NA  140 08/19/2021  ? K 3.9 08/19/2021  ? CL 106 08/19/2021  ? CO2 25 07/23/2021  ? ?Lab Results  ?Component Value Date  ? ALT 20 07/23/2021  ? AST 17 07/23/2021  ? ALKPHOS 112 07/23/2021  ? BILITOT 0.4 07/23/2021  ? ?Lab Results  ?Component Value Date  ? HGBA1C 6.0  07/23/2021  ? HGBA1C 6.3 (H) 06/16/2020  ? HGBA1C 7.9 (H) 09/01/2017  ? HGBA1C 5.3 04/01/2017  ? HGBA1C 5.8 (H) 09/05/2015  ? ?Lab Results  ?Component Value Date  ? INSULIN 14.1 06/16/2020  ? ?Lab Results  ?Component Value Date  ? TSH 1.83 07/23/2021  ? ?Lab Results  ?Component Value Date  ? CHOL 73 07/23/2021  ? HDL 43.50 07/23/2021  ? [place] 12 07/23/2021  ? TRIG 89.0 07/23/2021  ? CHOLHDL 2 07/23/2021  ? ?Lab Results  ?Component Value Date  ? VD25OH 25.9 (L) 06/16/2020  ? VD25OH 40.8 04/05/2018  ? ?Lab Results  ?Component Value Date  ? WBC 4.3 10/01/2021  ? HGB 12.6 10/01/2021  ? HCT 39.2 10/01/2021  ? MCV 95.1 10/01/2021  ? PLT 210 10/01/2021  ? ?Lab Results  ?Component Value Date  ? IRON 7 (L) 08/31/2017  ? TIBC 164 (L) 08/31/2017  ? FERRITIN 27 11/29/2014  ? ?Attestation Statements:  ? ?Reviewed by clinician on day of visit: allergies, medications, problem list, medical history, surgical history, family history, social history, and previous encounter notes. ? ?Time spent on visit including pre-visit chart review and post-visit care and charting was 30 minutes.  ? ?I, Lizbeth Bark, RMA, am acting as Location manager for Everardo Pacific, FNP. ? ?I have reviewed the above documentation for accuracy and completeness, and I agree with the above. Everardo Pacific, FNP  ?

## 2021-11-09 ENCOUNTER — Encounter (HOSPITAL_BASED_OUTPATIENT_CLINIC_OR_DEPARTMENT_OTHER): Payer: BC Managed Care – PPO | Admitting: Internal Medicine

## 2021-11-10 ENCOUNTER — Encounter (HOSPITAL_BASED_OUTPATIENT_CLINIC_OR_DEPARTMENT_OTHER): Payer: BC Managed Care – PPO | Admitting: Internal Medicine

## 2021-11-11 ENCOUNTER — Encounter (HOSPITAL_BASED_OUTPATIENT_CLINIC_OR_DEPARTMENT_OTHER): Payer: BC Managed Care – PPO | Admitting: General Surgery

## 2021-11-12 ENCOUNTER — Encounter (HOSPITAL_BASED_OUTPATIENT_CLINIC_OR_DEPARTMENT_OTHER): Payer: BC Managed Care – PPO | Admitting: General Surgery

## 2021-11-16 ENCOUNTER — Encounter (HOSPITAL_BASED_OUTPATIENT_CLINIC_OR_DEPARTMENT_OTHER): Payer: BC Managed Care – PPO | Admitting: General Surgery

## 2021-11-16 DIAGNOSIS — C549 Malignant neoplasm of corpus uteri, unspecified: Secondary | ICD-10-CM | POA: Diagnosis not present

## 2021-11-16 DIAGNOSIS — C50212 Malignant neoplasm of upper-inner quadrant of left female breast: Secondary | ICD-10-CM | POA: Diagnosis not present

## 2021-11-16 DIAGNOSIS — L59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: Secondary | ICD-10-CM | POA: Diagnosis not present

## 2021-11-16 DIAGNOSIS — N950 Postmenopausal bleeding: Secondary | ICD-10-CM | POA: Diagnosis not present

## 2021-11-16 DIAGNOSIS — N3041 Irradiation cystitis with hematuria: Secondary | ICD-10-CM | POA: Diagnosis not present

## 2021-11-16 DIAGNOSIS — D709 Neutropenia, unspecified: Secondary | ICD-10-CM | POA: Diagnosis not present

## 2021-11-16 DIAGNOSIS — E119 Type 2 diabetes mellitus without complications: Secondary | ICD-10-CM | POA: Diagnosis not present

## 2021-11-16 DIAGNOSIS — Z8543 Personal history of malignant neoplasm of ovary: Secondary | ICD-10-CM | POA: Diagnosis not present

## 2021-11-16 LAB — GLUCOSE, CAPILLARY
Glucose-Capillary: 139 mg/dL — ABNORMAL HIGH (ref 70–99)
Glucose-Capillary: 137 mg/dL — ABNORMAL HIGH (ref 70–99)

## 2021-11-16 NOTE — Progress Notes (Signed)
[name], [name] ([id number]) ?Visit Report for 11/16/2021 ?Arrival Information Details ?Patient Name: Date of Service: ?[name], [name] 11/16/2021 10:00 A M ?Medical Record Number: [id number] ?Patient Account Number: [id number] ?Date of Birth/Sex: Treating RN: ?27-Dec-1955 (66 y.o. [name], [name] ?Primary Care Anessia Oakland: [name] ?Other Clinician: [name] ?Referring Demetre Monaco: ?Treating Gilman Olazabal/Extender: [name] ?[name] ?[name]: 10 ?Visit Information History Since Last Visit ?All ordered tests and consults were completed: Yes ?Patient Arrived: Ambulatory ?Added or deleted any medications: No ?Arrival Time: 09:51 ?Any new allergies or adverse reactions: No ?Accompanied By: None ?Had a fall or experienced change in No ?Transfer Assistance: None ?activities of daily living that may affect ?Patient Identification Verified: Yes ?risk of falls: ?Secondary Verification Process Completed: Yes ?Signs or symptoms of abuse/neglect since last visito No ?Patient Requires Transmission-Based Precautions: No ?Hospitalized since last visit: No ?Patient Has Alerts: Yes ?Implantable device outside of the clinic excluding No ?Patient Alerts: Patient on Blood Thinner cellular tissue based products placed in the center ?since last visit: ?Pain Present Now: No ?Notes ?Paper version used prior to treatment. ?Electronic Signature(s) ?Signed: 11/16/2021 3:46:04 PM By: [name] EMT ?Entered By: [name] on 11/16/2021 15:46:04 ?-------------------------------------------------------------------------------- ?Encounter Discharge Information Details ?Patient Name: Date of Service: ?[name], [name] 11/16/2021 10:00 A M ?Medical Record Number: [id number] ?Patient Account Number: [id number] ?Date of Birth/Sex: Treating RN: ?[birth year] (65 y.o. [name], [name] ?Primary Care Ahrianna Siglin: [name] ?Other Clinician: [name] ?Referring Jahmel Flannagan: ?Treating Arelyn Gauer/Extender:  [name] ?[name] ?[name]: 10 ?Encounter Discharge Information Items ?Discharge Condition: Stable ?Ambulatory Status: Ambulatory ?Discharge Destination: Home ?Transportation: Private Auto ?Accompanied By: None ?Schedule Follow-up Appointment: No ?Clinical Summary of Care: ?Electronic Signature(s) ?Signed: 11/16/2021 3:54:12 PM By: [name] EMT ?Entered By: [name] on 11/16/2021 15:54:11 ?-------------------------------------------------------------------------------- ?Vitals Details ?Patient Name: ?Date of Service: ?[name], [name] 11/16/2021 10:00 A M ?Medical Record Number: [id number] ?Patient Account Number: [id number] ?Date of Birth/Sex: ?Treating RN: ?March 18, 1956 (66 y.o. [name], [name] ?Primary Care Kye Hedden: [name] ?Other Clinician: [name] ?Referring Andrika Peraza: ?Treating Farren Nelles/Extender: [name] ?[name] ?[name]: 10 ?Vital Signs ?Time Taken: 10:27 ?Temperature (??F): 98.2 ?Height (in): 62 ?Pulse (bpm): 61 ?Weight (lbs): 160 ?Respiratory Rate (breaths/min): 14 ?Body Mass Index (BMI): 29.3 ?Blood Pressure (mmHg): 126/50 ?Reference Range: 80 - 120 mg / dl ?Electronic Signature(s) ?Signed: 11/16/2021 3:46:52 PM By: [name] EMT ?Entered By: [name] on 11/16/2021 15:46:51 ?

## 2021-11-16 NOTE — Progress Notes (Signed)
[name], [name] ([id number]) ?Visit Report for 11/16/2021 ?Problem List Details ?Patient Name: Date of Service: ?[name], [name] 11/16/2021 10:00 A M ?Medical Record Number: [id number] ?Patient Account Number: [id number] ?Date of Birth/Sex: Treating RN: ?[birth year] (66 y.o. 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, unspecified 09/03/2021 No Yes ?N95.0 Postmenopausal bleeding 09/03/2021 No Yes ?D70.9 Neutropenia, unspecified 09/03/2021 No Yes ?E11.9 Type 2 diabetes mellitus without complications 0/09/4033 No Yes ?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 11/16/2021 ?Medical Record Number: [id number] ?Patient Account Number: [id number] ?Date of Birth/Sex: ?Treating RN: ?Jun 03, 1956 (66 y.o. 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, unspecified ?N95.0 Postmenopausal bleeding ?D70.9 Neutropenia, unspecified ?E11.9 Type 2 diabetes mellitus without complications ?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 ?Facility Procedures ?CPT4 Code: 31121624 ?Description: G0277-(Facility Use Only) HBOT full body chamber, 19mn , ICD-10 Diagnosis Description 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 C54.9  ?Malignant neoplasm of corpus uteri, unspecified N95.0 Postmenopausal bleeding ?Modifier: n ?Quantity: 4 ?Physician Procedures ?: CPT4 Code Description Modifier [id number] PHYS HYPERBARIC OXYGEN THERAPY ICD-10 Diagnosis Description 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

## 2021-11-16 NOTE — Progress Notes (Signed)
JAZZMEN, RESTIVO ([id number]) ?Visit Report for 11/16/2021 ?[name] Details ?Patient Name: Date of Service: ?[name], [name] 11/16/2021 10:00 A M ?Medical Record Number: [id number] ?Patient Account Number: [id number] ?Date of Birth/Sex: Treating RN: ?23-Dec-1955 (66 y.o. 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Treatment Segments: 4 ?Vital Signs ?Capillary Blood Glucose Reference Range: 80 - 120 mg / dl ?[name] Diabetic Blood Glucose Intervention Range: <131 mg/dl or >249 mg/dl ?Time Vitals Blood Respiratory Capillary Blood Glucose Pulse Action ?Type: ?Pulse: Temperature: ?Taken: ?Pressure: ?Rate: ?Glucose (mg/dl): ?Meter #: Oximetry (%) Taken: ?Pre 10:27 126/50 61 14 98.2 ?Post 12:53 140/50 61 14 97.9 139 ?Treatment Response ?Treatment Toleration: Well ?Treatment Completion Status: Treatment Completed without Adverse Event ?Treatment Notes ?The patient did well today with her treatment. I asked her before her treatment started if she had any problems  with her sinus or ears. she stated no. Stated ?that she had Afrin before coming in today. ?Physician [name] Attestation: ?I certify that I supervised this [name] treatment in accordance with Medicare ?guidelines. A trained emergency response team is readily available per Yes ?hospital policies and procedures. ?Continue HBOT as ordered. Yes ?Electronic Signature(s) ?Signed: 11/16/2021 4:16:19 PM By: [name] Maudlin MD FACS ?Previous Signature: 11/16/2021 3:51:34 PM Version By: [name] EMT ?Entered By: [name] on 11/16/2021 16:16:19 ?-------------------------------------------------------------------------------- ?[name] Safety Checklist Details ?Patient Name: ?Date of Service: ?[name] [name], [name] 11/16/2021 10:00 A M ?Medical Record Number: [id number] ?Patient Account Number: [id number] ?Date of Birth/Sex: ?Treating RN: ?1955/08/31 (66 y.o. [name], [name] ?Primary Care [name]: [name] ?Other Clinician: [name] ?Referring [name]: ?Treating [name]/Extender: [name] ?[name] ?[name]: 10 ?[name] Safety Checklist Items ?Safety Checklist ?Consent Form Signed ?Patient voided / foley secured and emptied ?When did you last eato 0815 ?Last dose of injectable or oral agent Saturday 11/14/2021 ?Ostomy pouch emptied and vented if applicable ?All implantable devices assessed, documented and approved Silicone Implants ?Intravenous access site secured and place ?NA ?Valuables secured ?Linens and cotton and cotton/polyester blend (less than 51% polyester) ?Personal oil-based products / skin lotions / body lotions removed ?Wigs or hairpieces removed ?NA ?Smoking or tobacco materials removed ?Books / newspapers / magazines / loose paper removed ?Cologne, aftershave, perfume and deodorant removed ?Jewelry removed (may wrap wedding band) ?NA ?Make-up removed ?Hair care products removed ?Battery operated devices (external) removed ?Heating patches and chemical warmers  removed ?Titanium eyewear removed ?NA Plastic frames ?Nail polish cured greater than 10 hours ?NA ?Casting material cured greater than 10 hours ?NA ?Hearing aids removed ?NA ?Loose dentures or partials removed ?NA ?Prosthetics have been removed ?NA ?Patient demonstrates correct use of air break device (if applicable) ?Patient  concerns have been addressed ?Patient grounding bracelet on and cord attached to chamber ?Specifics for Inpatients (complete in addition to above) ?Medication sheet sent with patient ?NA ?Intravenous medications needed or due during therapy sent with patient ?NA ?Drainage tubes (e.g. nasogastric tube or chest tube secured and vented) ?NA ?Endotracheal or Tracheotomy tube secured ?NA ?Cuff deflated of air and inflated with saline ?NA ?Airway suctioned ?NA ?Notes ?Paper version used prior to treatment. The checklist was done before treatment started. ?Electronic Signature(s) ?Signed: 11/16/2021 3:48:31 PM By: [name] EMT ?Entered By: [name] on 11/16/2021 15:48:31 ?

## 2021-11-17 ENCOUNTER — Encounter (HOSPITAL_BASED_OUTPATIENT_CLINIC_OR_DEPARTMENT_OTHER): Payer: BC Managed Care – PPO | Admitting: General Surgery

## 2021-11-17 DIAGNOSIS — L59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: Secondary | ICD-10-CM | POA: Diagnosis not present

## 2021-11-17 DIAGNOSIS — E119 Type 2 diabetes mellitus without complications: Secondary | ICD-10-CM | POA: Diagnosis not present

## 2021-11-17 DIAGNOSIS — N950 Postmenopausal bleeding: Secondary | ICD-10-CM | POA: Diagnosis not present

## 2021-11-17 DIAGNOSIS — C549 Malignant neoplasm of corpus uteri, unspecified: Secondary | ICD-10-CM | POA: Diagnosis not present

## 2021-11-17 DIAGNOSIS — Z8543 Personal history of malignant neoplasm of ovary: Secondary | ICD-10-CM | POA: Diagnosis not present

## 2021-11-17 DIAGNOSIS — N3041 Irradiation cystitis with hematuria: Secondary | ICD-10-CM | POA: Diagnosis not present

## 2021-11-17 DIAGNOSIS — C50212 Malignant neoplasm of upper-inner quadrant of left female breast: Secondary | ICD-10-CM | POA: Diagnosis not present

## 2021-11-17 DIAGNOSIS — D709 Neutropenia, unspecified: Secondary | ICD-10-CM | POA: Diagnosis not present

## 2021-11-17 LAB — GLUCOSE, CAPILLARY
Glucose-Capillary: 132 mg/dL — ABNORMAL HIGH (ref 70–99)
Glucose-Capillary: 110 mg/dL — ABNORMAL HIGH (ref 70–99)

## 2021-11-17 NOTE — Progress Notes (Addendum)
[name], RANCOURT ([id number]) ?Visit Report for 11/17/2021 ?[name] Details ?Patient Name: Date of Service: ?[name], [name] 11/17/2021 10:00 A M ?Medical Record Number: [id number] ?Patient Account Number: [id number] ?Date of Birth/Sex: Treating RN: ?January 04, 1956 (66 y.o. 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Treatment Segments: 4 ?Vital Signs ?Capillary Blood Glucose Reference Range: 80 - 120 mg / dl ?[name] Diabetic Blood Glucose Intervention Range: <131 mg/dl or >249 mg/dl ?Time Vitals Blood Respiratory Capillary Blood Glucose Pulse Action ?Type: ?Pulse: Temperature: ?Taken: ?Pressure: ?Rate: ?Glucose (mg/dl): ?Meter #: Oximetry (%) Taken: ?Pre 10:02 130/59 59 16 97.9 132 ?Post 12:41 143/68 59 16 97.7 110 ?Treatment Response ?Treatment Toleration: Well ?Treatment Completion Status: Treatment Completed without Adverse Event ?Treatment Notes ?I asked her before her treatment started if she had any problems with her sinus or ears, she stated that her  right sinus was draining, and asked if she could ?have some Afrin before her treatment. She sprayed 1 spray in each side. She did well with her treatment today. After her treatment, I asked how her ears and ?sinus were doing. She stated OK. ?Physician [name] Attestation: ?I certify that I supervised this [name] treatment in accordance with Medicare ?guidelines. A trained emergency response team is readily available per Yes ?hospital policies and procedures. ?Continue HBOT as ordered. Yes ?Electronic Signature(s) ?Signed: 11/17/2021 3:58:44 PM By: [name] MD FACS ?Previous Signature: 11/17/2021 2:22:55 PM Version By: [name] EMT ?Entered By: [name] on 11/17/2021 15:58:44 ?-------------------------------------------------------------------------------- ?[name] Safety Checklist Details ?Patient Name: ?Date of Service: ?[name], [name] 11/17/2021 10:00 A M ?Medical Record Number: [id number] ?Patient Account Number: [id number] ?Date of Birth/Sex: ?Treating RN: ?09-Nov-1955 (66 y.o. [name], [name] ?Primary Care Ximena Todaro: [name] ?Other Clinician: [name] ?Referring Zamauri Nez: ?Treating Amenda Duclos/Extender: [name] ?[name] ?[name]: 10 ?[name] Safety Checklist Items ?Safety Checklist ?Consent Form Signed ?Patient voided / foley secured and emptied ?When did you last eato 0830 ?Last dose of injectable or oral agent Saturday 10-14-2021 ?Ostomy pouch emptied and vented if applicable ?All implantable devices assessed, documented and approved Silicone Implants ?Intravenous access site secured and place ?NA ?Valuables secured ?Linens and cotton and cotton/polyester blend (less than 51% polyester) ?Personal oil-based products / skin lotions / body lotions removed ?Wigs or hairpieces removed ?NA ?Smoking or tobacco materials removed ?Books / newspapers / magazines / loose paper removed ?Cologne, aftershave, perfume and deodorant removed ?Jewelry removed (may wrap wedding  band) ?NA ?Make-up removed ?Hair care products removed ?Battery operated devices (external) removed ?Heating patches and chemical warmers removed ?Titanium eyewear removed ?NA Plastic frames ?Nail polish cured greater than 10 hours ?NA ?Casting material cured greater than  10 hours ?NA ?Hearing aids removed ?NA ?Loose dentures or partials removed ?NA ?Prosthetics have been removed ?NA ?Patient demonstrates correct use of air break device (if applicable) ?Patient concerns have been addressed ?Patient grounding bracelet on and cord attached to chamber ?Specifics for Inpatients (complete in addition to above) ?Medication sheet sent with patient ?NA ?Intravenous medications needed or due during therapy sent with patient ?NA ?Drainage tubes (e.g. nasogastric tube or chest tube secured and vented) ?NA ?Endotracheal or Tracheotomy tube secured ?NA ?Cuff deflated of air and inflated with saline ?NA ?Airway suctioned ?NA ?Notes ?Paper version used prior to treatment. The checklist was done before treatment started. ?Electronic Signature(s) ?Signed: 11/17/2021 2:10:21 PM By: [name] EMT ?Entered By: [name] on 11/17/2021 14:10:20 ?

## 2021-11-17 NOTE — Progress Notes (Addendum)
SHUNTAVIA, YERBY ([id number]) ?Visit Report for 11/17/2021 ?Arrival Information Details ?Patient Name: Date of Service: ?[name], [name] 11/17/2021 10:00 A M ?Medical Record Number: [id number] ?Patient Account Number: [id number] ?Date of Birth/Sex: Treating RN: ?08/28/1955 (66 y.o. [name], [name] ?Primary Care Scarlett Portlock: [name] ?Other Clinician: [name] ?Referring Lavender Stanke: ?Treating Aidyn Sportsman/Extender: [name] ?[name] ?[name]: 10 ?Visit Information History Since Last Visit ?All ordered tests and consults were completed: Yes ?Patient Arrived: Ambulatory ?Added or deleted any medications: No ?Arrival Time: 09:43 ?Any new allergies or adverse reactions: No ?Accompanied By: None ?Had a fall or experienced change in No ?Transfer Assistance: None ?activities of daily living that may affect ?Patient Identification Verified: Yes ?risk of falls: ?Secondary Verification Process Completed: Yes ?Signs or symptoms of abuse/neglect since last visito No ?Patient Requires Transmission-Based Precautions: No ?Hospitalized since last visit: No ?Patient Has Alerts: Yes ?Implantable device outside of the clinic excluding No ?Patient Alerts: Patient on Blood Thinner cellular tissue based products placed in the center ?since last visit: ?Pain Present Now: No ?Notes ?Paper version used prior to treatment. ?Electronic Signature(s) ?Signed: 11/17/2021 2:06:56 PM By: [name] EMT ?Entered By: [name] on 11/17/2021 14:06:55 ?-------------------------------------------------------------------------------- ?Encounter Discharge Information Details ?Patient Name: Date of Service: ?[name], [name] 11/17/2021 10:00 A M ?Medical Record Number: [id number] ?Patient Account Number: [id number] ?Date of Birth/Sex: Treating RN: ?Apr 29, 1956 (66 y.o. [name], [name] ?Primary Care Marla Pouliot: [name] ?Other Clinician: [name] ?Referring Alyrica Thurow: ?Treating Ovidio Steele/Extender:  [name] ?[name] ?[name]: 10 ?Encounter Discharge Information Items ?Discharge Condition: Stable ?Ambulatory Status: Ambulatory ?Discharge Destination: Home ?Transportation: Private Auto ?Accompanied By: None ?Schedule Follow-up Appointment: Yes ?Clinical Summary of Care: ?Electronic Signature(s) ?Signed: 11/17/2021 2:27:02 PM By: [name] EMT ?Entered By: [name] on 11/17/2021 14:27:01 ?-------------------------------------------------------------------------------- ?Vitals Details ?Patient Name: ?Date of Service: ?[name], [name] 11/17/2021 10:00 A M ?Medical Record Number: [id number] ?Patient Account Number: [id number] ?Date of Birth/Sex: ?Treating RN: ?06-09-1956 (66 y.o. [name], [name] ?Primary Care Liberato Stansbery: [name] ?Other Clinician: [name] ?Referring Danyel Griess: ?Treating Maleny Candy/Extender: [name] ?[name] ?[name]: 10 ?Vital Signs ?Time Taken: 10:02 ?Temperature (??F): 97.9 ?Height (in): 62 ?Pulse (bpm): 59 ?Weight (lbs): 160 ?Respiratory Rate (breaths/min): 16 ?Body Mass Index (BMI): 29.3 ?Blood Pressure (mmHg): 130/59 ?Capillary Blood Glucose (mg/dl): 132 ?Reference Range: 80 - 120 mg / dl ?Notes ?Paper version used prior to treatment. ?Electronic Signature(s) ?Signed: 11/17/2021 2:08:26 PM By: [name] EMT ?Entered By: [name] on 11/17/2021 14:08:26 ?

## 2021-11-17 NOTE — Progress Notes (Signed)
[name], [name] ([id number]) ?Visit Report for 11/17/2021 ?Problem List Details ?Patient Name: Date of Service: ?[name], [name] 11/17/2021 10:00 A M ?Medical Record Number: [id number] ?Patient Account Number: [id number] ?Date of Birth/Sex: Treating RN: ?[name]-03-[name] (66 y.o. 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, unspecified 09/03/2021 No Yes ?N95.0 Postmenopausal bleeding 09/03/2021 No Yes ?D70.9 Neutropenia, unspecified 09/03/2021 No Yes ?E11.9 Type 2 diabetes mellitus without complications 0/03/6577 No Yes ?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, unspecified ?N95.0 Postmenopausal bleeding ?D70.9 Neutropenia, unspecified ?E11.9 Type 2 diabetes mellitus without complications ?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 ?Facility Procedures ?CPT4 Code: 41324401 ?Description: G0277-(Facility Use Only) HBOT full body chamber, 45mn , ICD-10 Diagnosis Description 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 C54.9  ?Malignant neoplasm of corpus uteri, unspecified N95.0 Postmenopausal bleeding ?Modifier: n ?Quantity: 4 ?Physician Procedures ?: CPT4 Code Description Modifier [id number] 64403- WC PHYS HYPERBARIC OXYGEN THERAPY ICD-10 Diagnosis Description 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

## 2021-11-18 ENCOUNTER — Encounter (HOSPITAL_BASED_OUTPATIENT_CLINIC_OR_DEPARTMENT_OTHER): Payer: BC Managed Care – PPO | Admitting: General Surgery

## 2021-11-18 DIAGNOSIS — N3041 Irradiation cystitis with hematuria: Secondary | ICD-10-CM | POA: Diagnosis not present

## 2021-11-18 DIAGNOSIS — L59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: Secondary | ICD-10-CM | POA: Diagnosis not present

## 2021-11-18 DIAGNOSIS — E119 Type 2 diabetes mellitus without complications: Secondary | ICD-10-CM | POA: Diagnosis not present

## 2021-11-18 DIAGNOSIS — Z8543 Personal history of malignant neoplasm of ovary: Secondary | ICD-10-CM | POA: Diagnosis not present

## 2021-11-18 DIAGNOSIS — C50212 Malignant neoplasm of upper-inner quadrant of left female breast: Secondary | ICD-10-CM | POA: Diagnosis not present

## 2021-11-18 DIAGNOSIS — D709 Neutropenia, unspecified: Secondary | ICD-10-CM | POA: Diagnosis not present

## 2021-11-18 DIAGNOSIS — N950 Postmenopausal bleeding: Secondary | ICD-10-CM | POA: Diagnosis not present

## 2021-11-18 DIAGNOSIS — C549 Malignant neoplasm of corpus uteri, unspecified: Secondary | ICD-10-CM | POA: Diagnosis not present

## 2021-11-18 LAB — GLUCOSE, CAPILLARY
Glucose-Capillary: 104 mg/dL — ABNORMAL HIGH (ref 70–99)
Glucose-Capillary: 111 mg/dL — ABNORMAL HIGH (ref 70–99)
Glucose-Capillary: 122 mg/dL — ABNORMAL HIGH (ref 70–99)

## 2021-11-18 NOTE — Progress Notes (Signed)
SHAUNIECE, KWAN ([id number]) ?Visit Report for 11/18/2021 ?Problem List Details ?Patient Name: Date of Service: ?[name], [name] 11/18/2021 10:00 A M ?Medical Record Number: [id number] ?Patient Account Number: [id number] ?Date of Birth/Sex: Treating RN: ?[birth year] (66 y.o. 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, unspecified 09/03/2021 No Yes ?N95.0 Postmenopausal bleeding 09/03/2021 No Yes ?D70.9 Neutropenia, unspecified 09/03/2021 No Yes ?E11.9 Type 2 diabetes mellitus without complications 01/01/5637 No Yes ?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 11/18/2021 ?Medical Record Number: [id number] ?Patient Account Number: [id number] ?Date of Birth/Sex: ?Treating RN: ?1956/07/09 (66 y.o. 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, unspecified ?N95.0 Postmenopausal bleeding ?D70.9 Neutropenia, unspecified ?E11.9 Type 2 diabetes mellitus without complications ?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 ?Facility Procedures ?CPT4 Code: 81157262 ?Description: G0277-(Facility Use Only) HBOT full body chamber, 70mn , ICD-10 Diagnosis Description 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 C54.9  ?Malignant neoplasm of corpus uteri, unspecified N95.0 Postmenopausal bleeding ?Modifier: n ?Quantity: 4 ?Physician Procedures ?: CPT4 Code Description Modifier [id number] 16384- WC PHYS HYPERBARIC OXYGEN THERAPY ICD-10 Diagnosis Description 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

## 2021-11-18 NOTE — Progress Notes (Addendum)
[name], [name] ([id number]) ?Visit Report for 11/18/2021 ?[name] Details ?Patient Name: Date of Service: ?[name], [name] 11/18/2021 10:00 A M ?Medical Record Number: [id number] ?Patient Account Number: [id number] ?Date of Birth/Sex: Treating RN: ?April 23, 1956 (66 y.o. 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 ?[name] Treatment Details ?Treatment Number: 26 ?Patient Type: Outpatient ?Chamber Type: Monoplace ?Chamber Serial #: [id number] ?Treatment Protocol: 2.0 ATA with 90 minutes oxygen, with two 5 minute air breaks ?Treatment Details ?Compression Rate Down: 1.0 psi / minute ?De-Compression Rate Up: 1.0 psi / minute ?A breaks and breathing Decompress Decompress ?ir ?Compress Tx Pressure ?periods ?Begins Reached Begins Ends ?(leave unused spaces blank) ?Chamber Pressure (ATA '1 2 2 2 2 2 '$ - -2 1 ?) ?Clock Time (24 hr) 10:48 11:05 11:35 11:40 12:11 12:16 12:46 - 12:46 13:01 ?Treatment Length: 133 (minutes) ?Treatment Segments: 4 ?Vital Signs ?Capillary Blood Glucose Reference Range: 80 - 120 mg / dl ?[name] Diabetic Blood Glucose Intervention Range: <131 mg/dl or >249 mg/dl ?Type: Time Vitals Blood Pulse: Respiratory Temperature: Capillary Blood Glucose Pulse Action ?Taken: Pressure: Rate: Glucose (mg/dl): Meter #: Oximetry (%) Taken: ?Pre 10:02 145/58 60 16 98.5 104 Patient giver 8oz of Glucerna to drink ?Pre 10:38 111 30 min after drinking Glucerna ?Post 13:04 142/64 60 16 98.1 122 ?Treatment Response ?Treatment Toleration: Well ?Treatment Completion Status: Treatment Completed without Adverse Event ?Treatment Notes ?On arrival, the patient's  Blood Sugar was 104. She was given 8oz of Glucerna to drink. Blood sugar was rechecked CBG 111. I spoke with Dr. [name] about the ?patient blood sugar. Dr. [name] ordered to give the patient another 8oz of Glucerna to drink, and then start treatment. The patient had another 8oz of Glucerna ?before her treatment started. I asked her before her treatment started if she had any problems with her sinus or ears, and she stated that her right sinus was ?draining. She had used Afrin, before coming in today. She had no problems with her treatment today. ?Physician [name] Attestation: ?I certify that I supervised this [name] treatment in accordance with Medicare ?guidelines. A trained emergency response team is readily available per Yes ?hospital policies and procedures. ?Continue HBOT as ordered. Yes ?Electronic Signature(s) ?Signed: 11/18/2021 4:50:48 PM By: [name] MD FACS ?Previous Signature: 11/18/2021 3:52:07 PM Version By: [name] EMT ?Previous Signature: 11/18/2021 3:49:28 PM Version By: [name] EMT ?Previous Signature: 11/18/2021 11:31:03 AM Version By: [name] EMT ?Previous Signature: 11/18/2021 11:28:46 AM Version By: [name] EMT ?Entered By: [name] on 11/18/2021 16:50:47 ?-------------------------------------------------------------------------------- ?[name] Safety Checklist Details ?Patient Name: ?Date of Service: ?[name], [name] 11/18/2021 10:00 A M ?Medical Record Number: [id number] ?Patient Account Number: [id number] ?Date of Birth/Sex: ?Treating RN: ?10-15-1955 (66 y.o. [name], [name] ?Primary Care Archer Moist: [name] ?Other Clinician: [name] ?Referring Termaine Roupp: ?Treating Etoile Looman/Extender: [name] ?[name] ?[name]: 10 ?[name] Safety Checklist Items ?Safety Checklist ?Consent Form Signed ?Patient voided / foley secured and emptied ?When did you last eato 0830 ?Last dose of injectable or oral agent Saturday 11/14/2021 ?Ostomy  pouch emptied and vented if applicable ?All implantable devices assessed, documented and approved Silicone Implants ?Intravenous access site secured and place ?NA ?Valuables secured ?Linens and cotton and cotton/polyester  blend (less than 51% polyester) ?Personal oil-based products / skin lotions / body lotions removed ?Wigs or hairpieces removed ?NA ?Smoking or tobacco materials removed ?Books / newspapers / magazines / loose paper removed ?Cologne, aftershave, perfume and deodorant removed ?Jewelry removed (may wrap wedding band) ?NA ?Make-up removed ?Hair care products removed ?Battery operated devices (external) removed ?Heating patches and chemical warmers removed ?Titanium eyewear removed ?NA Plastic frames ?Nail polish cured greater than 10 hours ?NA ?Casting material cured greater than 10 hours ?NA ?Hearing aids removed ?NA ?Loose dentures or partials removed ?NA ?Prosthetics have been removed ?NA ?Patient demonstrates correct use of air break device (if applicable) ?Patient concerns have been addressed ?Patient grounding bracelet on and cord attached to chamber ?Specifics for Inpatients (complete in addition to above) ?Medication sheet sent with patient ?NA ?Intravenous medications needed or due during therapy sent with patient ?NA ?Drainage tubes (e.g. nasogastric tube or chest tube secured and vented) ?NA ?Endotracheal or Tracheotomy tube secured ?NA ?Cuff deflated of air and inflated with saline ?NA ?Airway suctioned ?NA ?Electronic Signature(s) ?Signed: 11/18/2021 11:22:49 AM By: [name] EMT ?Entered By: [name] on 11/18/2021 11:22:49 ?

## 2021-11-18 NOTE — Progress Notes (Addendum)
TISHARA, PIZANO ([id number]) ?Visit Report for 11/18/2021 ?Arrival Information Details ?Patient Name: Date of Service: ?[name], [name] [name] 11/18/2021 10:00 A M ?Medical Record Number: [id number] ?Patient Account Number: [id number] ?Date of Birth/Sex: Treating RN: ?1955-09-27 (66 y.o. [name], [name] ?Primary Care Dax Murguia: [name] ?Other Clinician: [name] ?Referring Broadus Costilla: ?Treating Shariya Gaster/Extender: [name] ?[name] ?[name]: 10 ?Visit Information History Since Last Visit ?All ordered tests and consults were completed: Yes ?Patient Arrived: Ambulatory ?Added or deleted any medications: No ?Arrival Time: 09:41 ?Any new allergies or adverse reactions: No ?Accompanied By: None ?Had a fall or experienced change in No ?Transfer Assistance: None ?activities of daily living that may affect ?Patient Identification Verified: Yes ?risk of falls: ?Secondary Verification Process Completed: Yes ?Signs or symptoms of abuse/neglect since last visito No ?Patient Requires Transmission-Based Precautions: No ?Hospitalized since last visit: No ?Patient Has Alerts: Yes ?Implantable device outside of the clinic excluding No ?Patient Alerts: Patient on Blood Thinner cellular tissue based products placed in the center ?since last visit: ?Pain Present Now: No ?Notes ?Paper version used prior to treatment. ?Electronic Signature(s) ?Signed: 11/18/2021 11:19:38 AM By: [name] EMT ?Entered By: [name] on 11/18/2021 11:19:37 ?-------------------------------------------------------------------------------- ?Encounter Discharge Information Details ?Patient Name: Date of Service: ?[name], [name] [name] 11/18/2021 10:00 A M ?Medical Record Number: [id number] ?Patient Account Number: [id number] ?Date of Birth/Sex: Treating RN: ?09-10-1955 (66 y.o. [name], [name] ?Primary Care Brion Sossamon: [name] ?Other Clinician: [name] ?Referring Christi Wirick: ?Treating Miho Monda/Extender:  [name] ?[name] ?[name]: 10 ?Encounter Discharge Information Items ?Discharge Condition: Stable ?Ambulatory Status: Ambulatory ?Discharge Destination: Home ?Transportation: Private Auto ?Accompanied By: None ?Schedule Follow-up Appointment: Yes ?Clinical Summary of Care: ?Electronic Signature(s) ?Signed: 11/18/2021 3:52:44 PM By: [name] EMT ?Entered By: [name] on 11/18/2021 15:52:43 ?-------------------------------------------------------------------------------- ?Vitals Details ?Patient Name: ?Date of Service: ?[name] [name], [name] 11/18/2021 10:00 A M ?Medical Record Number: [id number] ?Patient Account Number: [id number] ?Date of Birth/Sex: ?Treating RN: ?[birth year] (66 y.o. [name], [name] ?Primary Care Brennyn Ortlieb: [name] ?Other Clinician: [name] ?Referring [name]: ?Treating Nastassia Bazaldua/Extender: [name] ?[name] ?[name]: 10 ?Vital Signs ?Time Taken: 10:02 ?Temperature (??F): 98.5 ?Height (in): 62 ?Pulse (bpm): 60 ?Weight (lbs): 160 ?Respiratory Rate (breaths/min): 16 ?Body Mass Index (BMI): 29.3 ?Blood Pressure (mmHg): 145/58 ?Capillary Blood Glucose (mg/dl): 104 ?Reference Range: 80 - 120 mg / dl ?Notes ?Paper version used prior to treatment. ?Electronic Signature(s) ?Signed: 11/18/2021 11:20:33 AM By: [name] EMT ?Entered By: [name] on 11/18/2021 11:20:31 ?

## 2021-11-19 ENCOUNTER — Encounter (HOSPITAL_BASED_OUTPATIENT_CLINIC_OR_DEPARTMENT_OTHER): Payer: BC Managed Care – PPO | Admitting: General Surgery

## 2021-11-19 DIAGNOSIS — L59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: Secondary | ICD-10-CM | POA: Diagnosis not present

## 2021-11-19 DIAGNOSIS — D709 Neutropenia, unspecified: Secondary | ICD-10-CM | POA: Diagnosis not present

## 2021-11-19 DIAGNOSIS — E119 Type 2 diabetes mellitus without complications: Secondary | ICD-10-CM | POA: Diagnosis not present

## 2021-11-19 DIAGNOSIS — N950 Postmenopausal bleeding: Secondary | ICD-10-CM | POA: Diagnosis not present

## 2021-11-19 DIAGNOSIS — N3041 Irradiation cystitis with hematuria: Secondary | ICD-10-CM | POA: Diagnosis not present

## 2021-11-19 DIAGNOSIS — C50212 Malignant neoplasm of upper-inner quadrant of left female breast: Secondary | ICD-10-CM | POA: Diagnosis not present

## 2021-11-19 DIAGNOSIS — C549 Malignant neoplasm of corpus uteri, unspecified: Secondary | ICD-10-CM | POA: Diagnosis not present

## 2021-11-19 DIAGNOSIS — Z8543 Personal history of malignant neoplasm of ovary: Secondary | ICD-10-CM | POA: Diagnosis not present

## 2021-11-19 LAB — GLUCOSE, CAPILLARY
Glucose-Capillary: 103 mg/dL — ABNORMAL HIGH (ref 70–99)
Glucose-Capillary: 116 mg/dL — ABNORMAL HIGH (ref 70–99)
Glucose-Capillary: 129 mg/dL — ABNORMAL HIGH (ref 70–99)

## 2021-11-19 NOTE — Progress Notes (Addendum)
[name], [name] ([id number]) ?Visit Report for 11/19/2021 ?Arrival Information Details ?Patient Name: Date of Service: ?HO [place], [name] 11/19/2021 1:00 PM ?Medical Record Number: [id number] ?Patient Account Number: [id number] ?Date of Birth/Sex: Treating RN: ?08-Apr-1956 (66 y.o. [name], [name] ?Primary Care Brynnlee Cumpian: [name] ?Other Clinician: [name] ?Referring Osmani Kersten: ?Treating Kamari Buch/Extender: [name] ?[name] ?[name]: 11 ?Visit Information History Since Last Visit ?All ordered tests and consults were completed: Yes ?Patient Arrived: Ambulatory ?Added or deleted any medications: No ?Arrival Time: 12:44 ?Any new allergies or adverse reactions: No ?Accompanied By: None ?Had a fall or experienced change in No ?Transfer Assistance: None ?activities of daily living that may affect ?Patient Identification Verified: Yes ?risk of falls: ?Secondary Verification Process Completed: Yes ?Signs or symptoms of abuse/neglect since last visito No ?Patient Requires Transmission-Based Precautions: No ?Hospitalized since last visit: No ?Patient Has Alerts: Yes ?Implantable device outside of the clinic excluding No ?Patient Alerts: Patient on Blood Thinner cellular tissue based products placed in the center ?since last visit: ?Pain Present Now: No ?Notes ?Paper version used prior to treatment. ?Electronic Signature(s) ?Signed: 11/19/2021 2:02:22 PM By: [name] EMT ?Entered By: [name] on 11/19/2021 14:02:22 ?-------------------------------------------------------------------------------- ?Encounter Discharge Information Details ?Patient Name: Date of Service: ?HO [place], [name] 11/19/2021 1:00 PM ?Medical Record Number: [id number] ?Patient Account Number: [id number] ?Date of Birth/Sex: Treating RN: ?October 26, 1955 (66 y.o. [name], [name] ?Primary Care Makaria Poarch: [name] ?Other Clinician: [name] ?Referring Famous Eisenhardt: ?Treating Kierstynn Babich/Extender: [name] ?[name] ?[name]: 11 ?Encounter Discharge Information Items ?Discharge Condition: Stable ?Ambulatory Status: Ambulatory ?Discharge Destination: Home ?Transportation: Private Auto ?Accompanied By: self ?Schedule Follow-up Appointment: No ?Clinical Summary of Care: ?Electronic Signature(s) ?Signed: 11/19/2021 4:37:37 PM By: [name] CHT EMT BS ?, , ?Entered By: [name] on 11/19/2021 16:37:37 ?-------------------------------------------------------------------------------- ?Vitals Details ?Patient Name: ?Date of Service: ?[name], [name] 11/19/2021 1:00 PM ?Medical Record Number: [id number] ?Patient Account Number: [id number] ?Date of Birth/Sex: ?Treating RN: ?1956/03/09 (66 y.o. [name], [name] ?Primary Care Donyale Falcon: [name] ?Other Clinician: [name] ?Referring Markala Sitts: ?Treating Davonda Ausley/Extender: [name] ?[name] ?[name]: 11 ?Vital Signs ?Time Taken: 13:06 ?Temperature (??F): 97.9 ?Height (in): 62 ?Pulse (bpm): 63 ?Weight (lbs): 160 ?Respiratory Rate (breaths/min): 16 ?Body Mass Index (BMI): 29.3 ?Blood Pressure (mmHg): 128/51 ?Capillary Blood Glucose (mg/dl): 103 ?Reference Range: 80 - 120 mg / dl ?Notes ?Paper version used prior to treatment. ?Electronic Signature(s) ?Signed: 11/19/2021 2:03:45 PM By: [name] EMT ?Entered By: [name] on 11/19/2021 14:03:45 ?

## 2021-11-19 NOTE — Progress Notes (Addendum)
CARIZMA, DUNSWORTH ([id number]) ?Visit Report for 11/19/2021 ?[name] Details ?Patient Name: Date of Service: ?HO [place], [name] 11/19/2021 1:00 PM ?Medical Record Number: [id number] ?Patient Account Number: [id number] ?Date of Birth/Sex: Treating RN: ?Jan 19, 1956 (66 y.o. 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Treatment Segments: 4 ?Vital Signs ?Capillary Blood Glucose Reference Range: 80 - 120 mg / dl ?[name] Diabetic Blood Glucose Intervention Range: <131 mg/dl or >249 mg/dl ?Type: Time Vitals Blood Pulse: Respiratory Temperature: Capillary Blood Glucose Pulse Action ?Taken: Pressure: Rate: Glucose (mg/dl): Meter #: Oximetry (%) Taken: ?Pre 13:06 128/51 63 16 97.9 103 2 Patient giver 8oz of Glucerna to drink ?Post 16:00 145/75 60 16 97.8 129 2 > or = to 101 mg/dL, discharge per protocol ?Treatment Response ?Treatment Toleration: Well ?Treatment Completion Status: Treatment Completed without Adverse Event ?Treatment Notes ?On arrival, the patient's Blood  Sugar was 103. She was given 8oz of Glucerna to drink, and had a protein bar, that she had with her. Blood sugar was rechecked ?at 12:31 CBG 116. I spoke with Dr. [name] about the patient blood sugar. Dr. [name] stated to go ahead with treatment. She did well today with her treatment. ?Radio [employer] error today. The sound level is damaged allowing 3 extra minutes prior to decompression. Replacing said timer as a result. M[place] ?Electronic Signature(s) ?Signed: 11/19/2021 7:02:09 PM By: [name] CHT EMT BS ?, , ?Signed: 11/20/2021 7:34:15 AM By: [name] MD FACS ?Previous Signature: 11/19/2021 4:36:06 PM Version By: [name] CHT EMT BS ?, , ?Previous Signature: 11/19/2021 4:34:06 PM Version By: [name] CHT EMT BS ?, , ?Previous Signature: 11/19/2021 3:59:41 PM Version By: [name] EMT ?Previous Signature: 11/19/2021 4:10:15 PM Version By: [name] MD FACS ?Previous Signature: 11/19/2021 3:54:53 PM Version By: [name] MD FACS ?Previous Signature: 11/19/2021 2:35:13 PM Version By: [name] EMT ?Previous Signature: 11/19/2021 2:10:20 PM Version By: [name] EMT ?Entered By: [name] on 11/19/2021 19:02:08 ?-------------------------------------------------------------------------------- ?[name] Safety Checklist Details ?Patient Name: ?Date of Service: ?[name], [name] 11/19/2021 1:00 PM ?Medical Record Number: [id number] ?Patient Account Number: [id number] ?Date of Birth/Sex: ?Treating RN: ?[birth year] (66 y.o. [name], [name] ?Primary Care Alannis Hsia: [name] ?Other Clinician: [name] ?Referring Hakim Minniefield: ?Treating Skylinn Vialpando/Extender: [name] ?[name] ?[name]: 11 ?[name] Safety Checklist Items ?Safety Checklist ?Consent Form Signed ?Patient voided / foley secured and emptied ?When did you last eato 1130 ?Last dose of injectable or oral agent Saturday 10-14-2021 ?Ostomy pouch emptied and vented if  applicable Urostomy connected to collection bag ?All implantable devices assessed, documented and approved Silicone Implants ?Intravenous access site secured and place ?NA ?Valuables secured ?Linens and  cotton and cotton/polyester blend (less than 51% polyester) ?Personal oil-based products / skin lotions / body lotions removed ?Wigs or hairpieces removed ?NA ?Smoking or tobacco materials removed ?Books / newspapers / magazines / loose paper removed ?Cologne, aftershave, perfume and deodorant removed ?Jewelry removed (may wrap wedding band) ?NA ?Make-up removed ?Hair care products removed ?Battery operated devices (external) removed ?Heating patches and chemical warmers removed ?Titanium eyewear removed ?NA Plastic frames ?Nail polish cured greater than 10 hours ?NA ?Casting material cured greater than 10 hours ?NA ?Hearing aids removed ?NA ?Loose dentures or partials removed ?NA ?Prosthetics have been removed ?NA ?Patient demonstrates correct use of air break device (if applicable) ?Patient concerns have been addressed ?Patient grounding bracelet on and cord attached to chamber ?Specifics for Inpatients (complete in addition to above) ?Medication sheet sent with patient ?NA ?Intravenous medications needed or due during therapy sent with patient ?NA ?Drainage tubes (e.g. nasogastric tube or chest tube secured and vented) ?NA ?Endotracheal or Tracheotomy tube secured ?NA ?Cuff deflated of air and inflated with saline ?NA ?Airway suctioned ?NA ?Notes ?Paper version used prior to treatment. The checklist was done before treatment started. ?Electronic Signature(s) ?Signed: 11/19/2021 2:05:20 PM By: [name] EMT ?Entered By: [name] on 11/19/2021 14:05:20 ?

## 2021-11-20 ENCOUNTER — Encounter (HOSPITAL_BASED_OUTPATIENT_CLINIC_OR_DEPARTMENT_OTHER): Payer: BC Managed Care – PPO | Admitting: General Surgery

## 2021-11-20 DIAGNOSIS — D709 Neutropenia, unspecified: Secondary | ICD-10-CM | POA: Diagnosis not present

## 2021-11-20 DIAGNOSIS — N950 Postmenopausal bleeding: Secondary | ICD-10-CM | POA: Diagnosis not present

## 2021-11-20 DIAGNOSIS — C549 Malignant neoplasm of corpus uteri, unspecified: Secondary | ICD-10-CM | POA: Diagnosis not present

## 2021-11-20 DIAGNOSIS — E119 Type 2 diabetes mellitus without complications: Secondary | ICD-10-CM | POA: Diagnosis not present

## 2021-11-20 DIAGNOSIS — C50212 Malignant neoplasm of upper-inner quadrant of left female breast: Secondary | ICD-10-CM | POA: Diagnosis not present

## 2021-11-20 DIAGNOSIS — L59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: Secondary | ICD-10-CM | POA: Diagnosis not present

## 2021-11-20 DIAGNOSIS — N3041 Irradiation cystitis with hematuria: Secondary | ICD-10-CM | POA: Diagnosis not present

## 2021-11-20 DIAGNOSIS — Z8543 Personal history of malignant neoplasm of ovary: Secondary | ICD-10-CM | POA: Diagnosis not present

## 2021-11-20 LAB — GLUCOSE, CAPILLARY
Glucose-Capillary: 154 mg/dL — ABNORMAL HIGH (ref 70–99)
Glucose-Capillary: 117 mg/dL — ABNORMAL HIGH (ref 70–99)

## 2021-11-20 NOTE — Progress Notes (Signed)
[name], [name] ([id number]) ?Visit Report for 11/20/2021 ?Problem List Details ?Patient Name: Date of Service: ?[name] [place], [name] 11/20/2021 10:00 A M ?Medical Record Number: [id number] ?Patient Account Number: [id number] ?Date of Birth/Sex: Treating RN: ?November 30, 1955 (66 y.o. 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, unspecified 09/03/2021 No Yes ?N95.0 Postmenopausal bleeding 09/03/2021 No Yes ?D70.9 Neutropenia, unspecified 09/03/2021 No Yes ?E11.9 Type 2 diabetes mellitus without complications 09/11/4763 No Yes ?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 11/20/2021 ?Medical Record Number: [id number] ?Patient Account Number: [id number] ?Date of Birth/Sex: ?Treating RN: ?January 17, 1956 (66 y.o. 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 ?Facility Procedures ?CPT4 Code: 68127517 ?Description: G0277-(Facility Use Only) HBOT full body chamber, 108mn , ICD-10 Diagnosis Description 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 C54.9  ?Malignant neoplasm of corpus uteri, unspecified N95.0 Postmenopausal bleeding ?Modifier: n ?Quantity: 4 ?Physician Procedures ?: CPT4 Code Description Modifier [id number]- WC PHYS HYPERBARIC OXYGEN THERAPY ICD-10 Diagnosis Description 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

## 2021-11-20 NOTE — Progress Notes (Signed)
KAAVYA, PUSKARICH ([id number]) ?Visit Report for 11/19/2021 ?SuperBill Details ?Patient Name: Date of Service: ?[name], [name] 11/19/2021 ?Medical Record Number: [id number] ?Patient Account Number: [id number] ?Date of Birth/Sex: Treating RN: ?1956/04/30 (66 y.o. 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

## 2021-11-20 NOTE — Progress Notes (Addendum)
KENIDEE, CREGAN ([id number]) ?Visit Report for 11/20/2021 ?Arrival Information Details ?Patient Name: Date of Service: ?HO [place], [name] 11/20/2021 10:00 A M ?Medical Record Number: [id number] ?Patient Account Number: [id number] ?Date of Birth/Sex: Treating RN: ?11-03-1955 (66 y.o. F) [name], [name] ?Primary Care Alliya Marcon: [name] ?Other Clinician: [name] ?Referring Schneider Warchol: ?Treating Londyn Hotard/Extender: [name] ?[name] ?[name]: 11 ?Visit Information History Since Last Visit ?All ordered tests and consults were completed: Yes ?Patient Arrived: Ambulatory ?Added or deleted any medications: No ?Arrival Time: 10:08 ?Any new allergies or adverse reactions: No ?Accompanied By: None ?Had a fall or experienced change in No ?Transfer Assistance: None ?activities of daily living that may affect ?Patient Identification Verified: Yes ?risk of falls: ?Secondary Verification Process Completed: Yes ?Signs or symptoms of abuse/neglect since last visito No ?Patient Requires Transmission-Based Precautions: No ?Hospitalized since last visit: No ?Patient Has Alerts: Yes ?Implantable device outside of the clinic excluding No ?Patient Alerts: Patient on Blood Thinner cellular tissue based products placed in the center ?since last visit: ?Pain Present Now: No ?Notes ?Paper version used prior to treatment. ?Electronic Signature(s) ?Signed: 11/20/2021 1:30:03 PM By: [name] EMT ?Entered By: [name] on 11/20/2021 13:30:03 ?-------------------------------------------------------------------------------- ?Encounter Discharge Information Details ?Patient Name: Date of Service: ?HO [place], [name] 11/20/2021 10:00 A M ?Medical Record Number: [id number] ?Patient Account Number: [id number] ?Date of Birth/Sex: Treating RN: ?[birth year] (66 y.o. F) [name], [name] ?Primary Care Kadden Osterhout: [name] ?Other Clinician: [name] ?Referring Danilo Cappiello: ?Treating Oaklyn Jakubek/Extender:  [name] ?[name] ?[name]: 11 ?Encounter Discharge Information Items ?Discharge Condition: Stable ?Ambulatory Status: Ambulatory ?Discharge Destination: Home ?Transportation: Private Auto ?Accompanied By: None ?Schedule Follow-up Appointment: Yes ?Clinical Summary of Care: ?Electronic Signature(s) ?Signed: 11/20/2021 1:37:48 PM By: [name] EMT ?Entered By: [name] on 11/20/2021 13:37:48 ?-------------------------------------------------------------------------------- ?Vitals Details ?Patient Name: ?Date of Service: ?[name], [name] 11/20/2021 10:00 A M ?Medical Record Number: [id number] ?Patient Account Number: [id number] ?Date of Birth/Sex: ?Treating RN: ?07/21/1956 (66 y.o. F) [name], [name] ?Primary Care Tyarra Nolton: [name] ?Other Clinician: [name] ?Referring Parker Wherley: ?Treating Raden Byington/Extender: [name] ?[name] ?[name]: 11 ?Vital Signs ?Time Taken: 10:13 ?Temperature (??F): 98.5 ?Height (in): 62 ?Pulse (bpm): 72 ?Weight (lbs): 160 ?Respiratory Rate (breaths/min): 20 ?Body Mass Index (BMI): 29.3 ?Blood Pressure (mmHg): 106/56 ?Capillary Blood Glucose (mg/dl): 154 ?Reference Range: 80 - 120 mg / dl ?Electronic Signature(s) ?Signed: 11/20/2021 1:30:46 PM By: [name] EMT ?Entered By: [name] on 11/20/2021 13:30:46 ?

## 2021-11-20 NOTE — Progress Notes (Addendum)
[name], [name] ([id number]) ?Visit Report for 11/20/2021 ?[name] Details ?Patient Name: Date of Service: ?[name], [name] 11/20/2021 10:00 A M ?Medical Record Number: [id number] ?Patient Account Number: [id number] ?Date of Birth/Sex: Treating RN: ?02/26/1956 (66 y.o. 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 ?[name] [name] Details ?[name] Number: 28 ?Patient Type: Outpatient ?Chamber Type: Monoplace ?Chamber Serial #: [id number] ?[name] Protocol: 2.0 ATA with 90 minutes oxygen, with two 5 minute air breaks ?[name] Details ?Compression Rate Down: 1.0 psi / minute ?De-Compression Rate Up: 1.0 psi / minute ?A breaks and breathing ?ir ?Compress Tx Pressure periods Decompress Decompress ?Begins Reached (leave unused spaces Begins Ends ?blank) ?Chamber Pressure (ATA '1 2 2 2 2 2 '$ --2 1 ?) ?Clock Time (24 hr) 10:39 10:58 10:28 10:33 12:03 12:08 - - 12:38 12:50 ?[name] Length: 131 (minutes) ?[name] Segments: 4 ?Vital Signs ?Capillary Blood Glucose Reference Range: 80 - 120 mg / dl ?[name] Diabetic Blood Glucose Intervention Range: <131 mg/dl or >249 mg/dl ?Time Vitals Blood Respiratory Capillary Blood Glucose Pulse Action ?Type: ?Pulse: Temperature: ?Taken: ?Pressure: ?Rate: ?Glucose (mg/dl): ?Meter #: Oximetry (%) Taken: ?Pre 10:13 106/56 72 20 98.5 154 ?Post 12:59 130/63 68 18 98.6 117 ?[name] Response ?[name] Toleration: Well ?[name] Completion Status: [name] Completed without Adverse Event ?[name] Notes ?The patient did well today with her [name], with no problems noted today. ?Physician [name] Attestation: ?I  certify that I supervised this [name] [name] in accordance with Medicare ?guidelines. A trained emergency response team is readily available per Yes ?hospital policies and procedures. ?Continue HBOT as ordered. Yes ?Electronic Signature(s) ?Signed: 11/20/2021 4:54:59 PM By: [name] MD FACS ?Previous Signature: 11/20/2021 1:35:54 PM Version By: [name] EMT ?Entered By: [name] on 11/20/2021 16:54:59 ?-------------------------------------------------------------------------------- ?[name] Safety Checklist Details ?Patient Name: ?Date of Service: ?[name], [name] 11/20/2021 10:00 A M ?Medical Record Number: [id number] ?Patient Account Number: [id number] ?Date of Birth/Sex: ?Treating RN: ?04-10-1956 (66 y.o. F) [name], [name] ?Primary Care [name]: [name] ?Other Clinician: [name] ?Referring Esteven Overfelt: ?Treating Davyon Fisch/Extender: [name] ?[name] ?[name]: 11 ?[name] Safety Checklist Items ?Safety Checklist ?Consent Form Signed ?Patient voided / foley secured and emptied ?When did you last eato 0900 ?Last dose of injectable or oral agent Saturday 10-14-2021 ?Ostomy pouch emptied and vented if applicable Urostomy connected to collection bag ?All implantable devices assessed, documented and approved Silicone Implants ?Intravenous access site secured and place ?NA ?Valuables secured ?Linens and cotton and cotton/polyester blend (less than 51% polyester) ?Personal oil-based products / skin lotions / body lotions removed ?Wigs or hairpieces removed ?NA ?Smoking or tobacco materials removed ?Books / newspapers / magazines / loose paper removed ?Cologne, aftershave, perfume and deodorant removed ?Jewelry removed (may wrap wedding band) ?NA ?Make-up removed ?Hair care products removed ?Battery operated devices (external) removed ?Heating patches and chemical warmers removed ?Titanium eyewear removed ?NA Plastic frames ?Nail polish cured greater than 10  hours ?NA ?Casting material cured greater than 10 hours ?NA ?Hearing aids removed ?NA ?Loose dentures or partials removed ?NA ?Prosthetics have been removed ?NA ?Patient demonstrates correct use of air break device (if applicable) ?Patient concerns have been addressed ?Patient grounding bracelet on and cord attached to chamber ?Specifics for Inpatients (complete in  addition to above) ?Medication sheet sent with patient ?NA ?Intravenous medications needed or due during therapy sent with patient ?NA ?Drainage tubes (e.g. nasogastric tube or chest tube secured and vented) ?NA ?Endotracheal or Tracheotomy tube secured ?NA ?Cuff deflated of air and inflated with saline ?NA ?Airway suctioned ?NA ?Electronic Signature(s) ?Signed: 11/20/2021 1:32:02 PM By: [name] EMT ?Entered By: [name] on 11/20/2021 13:32:01 ?

## 2021-11-23 ENCOUNTER — Encounter (HOSPITAL_BASED_OUTPATIENT_CLINIC_OR_DEPARTMENT_OTHER): Payer: BC Managed Care – PPO | Admitting: Internal Medicine

## 2021-11-23 DIAGNOSIS — N950 Postmenopausal bleeding: Secondary | ICD-10-CM | POA: Diagnosis not present

## 2021-11-23 DIAGNOSIS — D709 Neutropenia, unspecified: Secondary | ICD-10-CM | POA: Diagnosis not present

## 2021-11-23 DIAGNOSIS — Z8543 Personal history of malignant neoplasm of ovary: Secondary | ICD-10-CM

## 2021-11-23 DIAGNOSIS — E119 Type 2 diabetes mellitus without complications: Secondary | ICD-10-CM | POA: Diagnosis not present

## 2021-11-23 DIAGNOSIS — C549 Malignant neoplasm of corpus uteri, unspecified: Secondary | ICD-10-CM | POA: Diagnosis not present

## 2021-11-23 DIAGNOSIS — C50212 Malignant neoplasm of upper-inner quadrant of left female breast: Secondary | ICD-10-CM | POA: Diagnosis not present

## 2021-11-23 DIAGNOSIS — L59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: Secondary | ICD-10-CM

## 2021-11-23 DIAGNOSIS — N3041 Irradiation cystitis with hematuria: Secondary | ICD-10-CM | POA: Diagnosis not present

## 2021-11-23 LAB — GLUCOSE, CAPILLARY
Glucose-Capillary: 133 mg/dL — ABNORMAL HIGH (ref 70–99)
Glucose-Capillary: 169 mg/dL — ABNORMAL HIGH (ref 70–99)

## 2021-11-23 NOTE — Progress Notes (Signed)
[name], [name] ([id number]) ?Visit Report for 11/23/2021 ?Arrival Information Details ?Patient Name: Date of Service: ?HO [place], [name] 11/23/2021 10:00 A M ?Medical Record Number: [id number] ?Patient Account Number: [id number] ?Date of Birth/Sex: Treating RN: ?07/18/1956 (66 y.o. [name], [name] ?Primary Care Lanitra Battaglini: [name] ?Other Clinician: [name] ?Referring Deidrick Rainey: ?Treating Zeina Akkerman/Extender: [name] ?[name] ?[name]: 11 ?Visit Information History Since Last Visit ?All ordered tests and consults were completed: Yes ?Patient Arrived: Ambulatory ?Added or deleted any medications: No ?Arrival Time: 10:02 ?Any new allergies or adverse reactions: No ?Accompanied By: None ?Had a fall or experienced change in No ?Transfer Assistance: None ?activities of daily living that may affect ?Patient Identification Verified: Yes ?risk of falls: ?Secondary Verification Process Completed: Yes ?Signs or symptoms of abuse/neglect since last visito No ?Patient Requires Transmission-Based Precautions: No ?Hospitalized since last visit: No ?Patient Has Alerts: Yes ?Implantable device outside of the clinic excluding No ?Patient Alerts: Patient on Blood Thinner cellular tissue based products placed in the center ?since last visit: ?Pain Present Now: No ?Notes ?Paper version used prior to treatment. ?Electronic Signature(s) ?Signed: 11/23/2021 2:36:04 PM By: [name] EMT ?Entered By: [name] on 11/23/2021 14:36:04 ?-------------------------------------------------------------------------------- ?Encounter Discharge Information Details ?Patient Name: Date of Service: ?HO [place], [name] 11/23/2021 10:00 A M ?Medical Record Number: [id number] ?Patient Account Number: [id number] ?Date of Birth/Sex: Treating RN: ?Feb 17, 1956 (66 y.o. [name], [name] ?Primary Care Guillermo Difrancesco: [name] ?Other Clinician: [name] ?Referring Jackie Russman: ?Treating Adelis Docter/Extender:  [name] ?[name] ?[name]: 11 ?Encounter Discharge Information Items ?Discharge Condition: Stable ?Ambulatory Status: Ambulatory ?Discharge Destination: Home ?Transportation: Private Auto ?Accompanied By: None ?Schedule Follow-up Appointment: Yes ?Clinical Summary of Care: ?Electronic Signature(s) ?Signed: 11/23/2021 2:54:50 PM By: [name] EMT ?Entered By: [name] on 11/23/2021 14:54:49 ?-------------------------------------------------------------------------------- ?Vitals Details ?Patient Name: ?Date of Service: ?[name], [name] 11/23/2021 10:00 A M ?Medical Record Number: [id number] ?Patient Account Number: [id number] ?Date of Birth/Sex: ?Treating RN: ?[birth year] (66 y.o. [name], [name] ?Primary Care Irish Piech: [name] ?Other Clinician: [name] ?Referring Desmen Schoffstall: ?Treating Ernesta Trabert/Extender: [name] ?[name] ?[name]: 11 ?Vital Signs ?Time Taken: 10:21 ?Temperature (??F): 99.4 ?Height (in): 62 ?Pulse (bpm): 78 ?Weight (lbs): 160 ?Respiratory Rate (breaths/min): 18 ?Body Mass Index (BMI): 29.3 ?Blood Pressure (mmHg): 111/84 ?Capillary Blood Glucose (mg/dl): 133 ?Reference Range: 80 - 120 mg / dl ?Notes ?Paper version used prior to treatment. ?Electronic Signature(s) ?Signed: 11/23/2021 2:36:52 PM By: [name] EMT ?Entered By: [name] on 11/23/2021 14:36:52 ?

## 2021-11-23 NOTE — Progress Notes (Addendum)
[name], APPENZELLER ([id number]) ?Visit Report for 11/23/2021 ?[name] Details ?Patient Name: Date of Service: ?HO [place], [name] 11/23/2021 10:00 A M ?Medical Record Number: [id number] ?Patient Account Number: [id number] ?Date of Birth/Sex: Treating RN: ?Nov 25, 1955 (66 y.o. 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Treatment Segments: 4 ?Vital Signs ?Capillary Blood Glucose Reference Range: 80 - 120 mg / dl ?[name] Diabetic Blood Glucose Intervention Range: <131 mg/dl or >249 mg/dl ?Time Vitals Blood Respiratory Capillary Blood Glucose Pulse Action ?Type: ?Pulse: Temperature: ?Taken: ?Pressure: ?Rate: ?Glucose (mg/dl): ?Meter #: Oximetry (%) Taken: ?Pre 10:21 111/84 78 18 99.4 133 ?Post 12:46 128/63 72 18 98.8 169 ?Treatment Response ?Treatment Toleration: Well ?Treatment Completion Status: Treatment Completed without Adverse Event ?Treatment Notes ?The patient stated that she "felt off" today like she had a cold. She stated that she had taken a home COVID  test, with neg results. ?Her treatment went well today, with no problems noted. ?Physician [name] Attestation: ?I certify that I supervised this [name] treatment in accordance with Medicare ?guidelines. A trained emergency response team is readily available per Yes ?hospital policies and procedures. ?Continue HBOT as ordered. Yes ?Electronic Signature(s) ?Signed: 11/23/2021 3:53:12 PM By: [name] Shan DO ?Previous Signature: 11/23/2021 2:51:32 PM Version By: [name] EMT ?Entered By: [name] on 11/23/2021 15:51:36 ?-------------------------------------------------------------------------------- ?[name] Safety Checklist Details ?Patient Name: ?Date of Service: ?[name] 11/23/2021 10:00 A M ?Medical Record Number: [id number] ?Patient Account Number: [id number] ?Date of Birth/Sex: ?Treating RN: ?04-24-1956 (66 y.o. [name], [name] ?Primary Care Staci Carver: [name] ?Other Clinician: [name] ?Referring Charonda Hefter: ?Treating Matheau Orona/Extender: [name] ?[name] ?[name]: 11 ?[name] Safety Checklist Items ?Safety Checklist ?Consent Form Signed ?Patient voided / foley secured and emptied ?When did you last eato 0730 ?Last dose of injectable or oral agent Saturday 11/21/2021 ?Ostomy pouch emptied and vented if applicable ?All implantable devices assessed, documented and approved Silicone Implants ?Intravenous access site secured and place ?NA ?Valuables secured ?Linens and cotton and cotton/polyester blend (less than 51% polyester) ?Personal oil-based products / skin lotions / body lotions removed ?Wigs or hairpieces removed ?NA ?Smoking or tobacco materials removed ?Books / newspapers / magazines / loose paper removed ?Cologne, aftershave, perfume and deodorant removed ?Jewelry removed (may wrap wedding band) ?NA ?Make-up removed ?Hair care products removed ?Battery operated devices (external) removed ?Heating patches and chemical warmers removed ?Titanium eyewear  removed ?NA Plastic frames ?Nail polish cured greater than 10 hours ?NA ?Casting material cured greater than 10 hours ?NA ?Hearing aids removed ?NA ?Loose dentures or partials removed ?NA ?Prosthetics have been removed ?NA ?Patient demonstrates correct use of air break device (if applicable) ?Patient concerns have  been addressed ?Patient grounding bracelet on and cord attached to chamber ?Specifics for Inpatients (complete in addition to above) ?Medication sheet sent with patient ?NA ?Intravenous medications needed or due during therapy sent with patient ?NA ?Drainage tubes (e.g. nasogastric tube or chest tube secured and vented) ?NA ?Endotracheal or Tracheotomy tube secured ?NA ?Cuff deflated of air and inflated with saline ?NA ?Airway suctioned ?NA ?Notes ?Paper version used prior to treatment. ?Electronic Signature(s) ?Signed: 11/23/2021 2:38:40 PM By: [name] EMT ?Entered By: [name] on 11/23/2021 14:38:40 ?

## 2021-11-23 NOTE — Progress Notes (Signed)
JACLIN, FINKS ([id number]) ?Visit Report for 11/23/2021 ?Problem List Details ?Patient Name: Date of Service: ?HO [place], [name] 11/23/2021 10:00 A M ?Medical Record Number: [id number] ?Patient Account Number: [id number] ?Date of Birth/Sex: Treating RN: ?November 04, 1955 (66 y.o. 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, unspecified 09/03/2021 No Yes ?N95.0 Postmenopausal bleeding 09/03/2021 No Yes ?D70.9 Neutropenia, unspecified 09/03/2021 No Yes ?E11.9 Type 2 diabetes mellitus without complications 0/10/5595 No Yes ?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 11/23/2021 ?Medical Record Number: [id number] ?Patient Account Number: [id number] ?Date of Birth/Sex: ?Treating RN: ?Sep 10, 1955 (66 y.o. 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 ?Facility Procedures ?CPT4 Code: 46803212 ?Description: G0277-(Facility Use Only) HBOT full body chamber, 12mn , ICD-10 Diagnosis Description 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 C54.9  ?Malignant neoplasm of corpus uteri, unspecified N95.0 Postmenopausal bleeding ?Modifier: n ?Quantity: 4 ?Physician Procedures ?: CPT4 Code Description Modifier [id number]- WC PHYS HYPERBARIC OXYGEN THERAPY ICD-10 Diagnosis Description 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

## 2021-11-24 ENCOUNTER — Encounter (HOSPITAL_BASED_OUTPATIENT_CLINIC_OR_DEPARTMENT_OTHER): Payer: BC Managed Care – PPO | Admitting: Internal Medicine

## 2021-11-24 DIAGNOSIS — N950 Postmenopausal bleeding: Secondary | ICD-10-CM | POA: Diagnosis not present

## 2021-11-24 DIAGNOSIS — C50212 Malignant neoplasm of upper-inner quadrant of left female breast: Secondary | ICD-10-CM | POA: Diagnosis not present

## 2021-11-24 DIAGNOSIS — L59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: Secondary | ICD-10-CM | POA: Diagnosis not present

## 2021-11-24 DIAGNOSIS — Z8543 Personal history of malignant neoplasm of ovary: Secondary | ICD-10-CM

## 2021-11-24 DIAGNOSIS — C549 Malignant neoplasm of corpus uteri, unspecified: Secondary | ICD-10-CM | POA: Diagnosis not present

## 2021-11-24 DIAGNOSIS — D709 Neutropenia, unspecified: Secondary | ICD-10-CM | POA: Diagnosis not present

## 2021-11-24 DIAGNOSIS — N3041 Irradiation cystitis with hematuria: Secondary | ICD-10-CM | POA: Diagnosis not present

## 2021-11-24 DIAGNOSIS — E119 Type 2 diabetes mellitus without complications: Secondary | ICD-10-CM | POA: Diagnosis not present

## 2021-11-24 LAB — GLUCOSE, CAPILLARY
Glucose-Capillary: 163 mg/dL — ABNORMAL HIGH (ref 70–99)
Glucose-Capillary: 162 mg/dL — ABNORMAL HIGH (ref 70–99)

## 2021-11-25 ENCOUNTER — Encounter (HOSPITAL_BASED_OUTPATIENT_CLINIC_OR_DEPARTMENT_OTHER): Payer: BC Managed Care – PPO | Admitting: General Surgery

## 2021-11-25 NOTE — Progress Notes (Signed)
[name], [name] ([id number]) ?Visit Report for 11/24/2021 ?SuperBill Details ?Patient Name: Date of Service: ?[name] 11/24/2021 ?Medical Record Number: [id number] ?Patient Account Number: [id number] ?Date of Birth/Sex: Treating RN: ?November 13, 1955 (66 y.o. 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 ?Facility Procedures ?CPT4 Code Description Modifier Quantity ?[id number] G0277-(Facility Use Only) HBOT full body chamber, 33mn ?, 4 ?ICD-10 Diagnosis Description ?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 ?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 ?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, unspecified ?N95.0 Postmenopausal bleeding ?Physician Procedures ?Quantity ?CPT4 Code Description Modifier ?[id number]- WC PHYS HYPERBARIC OXYGEN THERAPY 1 ?ICD-10 Diagnosis Description ?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

## 2021-11-25 NOTE — Progress Notes (Signed)
MONASIA, LAIR ([id number]) ?Visit Report for 11/24/2021 ?Arrival Information Details ?Patient Name: Date of Service: ?[name], [name] 11/24/2021 10:00 A M ?Medical Record Number: [id number] ?Patient Account Number: [id number] ?Date of Birth/Sex: Treating RN: ?Jun 14, 1956 (66 y.o. [name], [name] ?Primary Care Orazio Weller: [name] ?Other Clinician: [name] ?Referring Willodean Leven: ?Treating Nereida Schepp/Extender: [name] ?[name] ?[name]: 11 ?Visit Information History Since Last Visit ?All ordered tests and consults were completed: Yes ?Patient Arrived: Ambulatory ?Added or deleted any medications: No ?Arrival Time: 09:42 ?Any new allergies or adverse reactions: No ?Accompanied By: self ?Had a fall or experienced change in No ?Transfer Assistance: None ?activities of daily living that may affect ?Patient Identification Verified: Yes ?risk of falls: ?Secondary Verification Process Completed: Yes ?Signs or symptoms of abuse/neglect since last visito No ?Patient Requires Transmission-Based Precautions: No ?Hospitalized since last visit: No ?Patient Has Alerts: Yes ?Implantable device outside of the clinic excluding No ?Patient Alerts: Patient on Blood Thinner cellular tissue based products placed in the center ?since last visit: ?Pain Present Now: No ?Electronic Signature(s) ?Signed: 11/25/2021 11:39:41 AM By: [name] CHT EMT BS ?, , ?Entered By: [name] on 11/24/2021 10:42:04 ?-------------------------------------------------------------------------------- ?Encounter Discharge Information Details ?Patient Name: Date of Service: ?[name], [name] 11/24/2021 10:00 A M ?Medical Record Number: [id number] ?Patient Account Number: [id number] ?Date of Birth/Sex: Treating RN: ?[birth year] (66 y.o. [name], [name] ?Primary Care Pike Scantlebury: [name] ?Other Clinician: [name] ?Referring Deondre Marinaro: ?Treating Macala Baldonado/Extender: [name] ?[name] ?[name]: 11 ?Encounter Discharge Information Items ?Discharge Condition: Stable ?Ambulatory Status: Ambulatory ?Discharge Destination: Home ?Transportation: Private Auto ?Accompanied By: self ?Schedule Follow-up Appointment: No ?Clinical Summary of Care: ?Electronic Signature(s) ?Signed: 11/25/2021 11:39:41 AM By: [name] CHT EMT BS ?, , ?Entered By: [name] on 11/24/2021 13:11:56 ?-------------------------------------------------------------------------------- ?Vitals Details ?Patient Name: ?Date of Service: ?[name] [name], [name] 11/24/2021 10:00 A M ?Medical Record Number: [id number] ?Patient Account Number: [id number] ?Date of Birth/Sex: ?Treating RN: ?Dec 13, 1955 (66 y.o. [name], [name] ?Primary Care Declynn Lopresti: [name] ?Other Clinician: [name] ?Referring Rodel Glaspy: ?Treating Jenniah Bhavsar/Extender: [name] ?[name] ?[name]: 11 ?Vital Signs ?Time Taken: 10:05 ?Temperature (??F): 101.6 ?Height (in): 62 ?Pulse (bpm): 83 ?Weight (lbs): 160 ?Respiratory Rate (breaths/min): 20 ?Body Mass Index (BMI): 29.3 ?Blood Pressure (mmHg): 119/68 ?Capillary Blood Glucose (mg/dl): 163 ?Reference Range: 80 - 120 mg / dl ?Airway ?Pulse Oximetry (%): 97 ?Electronic Signature(s) ?Signed: 11/25/2021 11:39:41 AM By: [name] CHT EMT BS ?, , ?Entered By: [name] on 11/24/2021 10:42:50 ?

## 2021-11-25 NOTE — Progress Notes (Signed)
EMBERLIN, VERNER ([id number]) ?Visit Report for 11/24/2021 ?[name] Details ?Patient Name: Date of Service: ?[name], [name] 11/24/2021 10:00 A M ?Medical Record Number: [id number] ?Patient Account Number: [id number] ?Date of Birth/Sex: Treating RN: ?10/18/1955 (66 y.o. 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Treatment Segments: 4 ?Vital Signs ?Capillary Blood Glucose Reference Range: 80 - 120 mg / dl ?[name] Diabetic Blood Glucose Intervention Range: <131 mg/dl or >249 mg/dl ?Type: Time Vitals Blood Pulse: Respiratory Temperature: Capillary Blood Glucose Pulse Action ?Taken: ?Pressure: ?Rate: ?Glucose (mg/dl): Meter #: Oximetry (%) Taken: ?Pre 10:05 119/68 83 20 101.6 163 2 97 Temperature reported to physician. ?Pre 10:15 99 ?Pre 10:20 99 ?Post 12:46 126/79 79 18 98.9 162 2 ?Treatment Response ?Treatment Toleration: Well ?Treatment Completion Status: Treatment Completed without Adverse Event ?Treatment Notes ?Patient's initial temperature was 101.19F. T  emperature was reported to physician. Two subsequent measurements of temperature were 99.51F. Patient was ?cleared for treatment. Patient tolerated treatment well. ?Dr. Soyla Dryer evaluated patient when initial temperature of 101.6 F was reported. She had 2 temps of 99.0 F at least 10 minutes apart from the original ?report and of each other. She overall felt fine. Lungs were clear to auscultation. ?Physician [name] Attestation: ?I certify that I supervised this [name] treatment in accordance with Medicare ?guidelines. A trained emergency response team is readily available per Yes ?hospital policies and procedures. ?Continue HBOT as ordered. Yes ?Electronic Signature(s) ?Signed: 11/24/2021 4:33:51 PM By: [name] DO ?Previous Signature: 11/24/2021 1:09:34 PM Version By: [name] CHT EMT BS ?, , ?Entered By: [name] on 11/24/2021 16:30:47 ?-------------------------------------------------------------------------------- ?[name] Safety Checklist Details ?Patient Name: ?Date of Service: ?[name], [name] 11/24/2021 10:00 A M ?Medical Record Number: [id number] ?Patient Account Number: [id number] ?Date of Birth/Sex: ?Treating RN: ?11/13/1955 (66 y.o. [name], [name] ?Primary Care Jorgen Wolfinger: [name] ?Other Clinician: [name] ?Referring Kalkidan Caudell: ?Treating Dylann Layne/Extender: [name] ?[name] ?[name]: 11 ?[name] Safety Checklist Items ?Safety Checklist ?Consent Form Signed ?Patient voided / foley secured and emptied ?When did you last eato 0800 ?Last dose of injectable or oral agent 11/21/2021 ?Ostomy pouch emptied and vented if applicable Urostomy attached to collection bag ?All implantable devices assessed, documented and approved Silicone Implants ?Intravenous access site secured and place ?NA ?Valuables secured ?Linens and cotton and cotton/polyester blend (less than 51% polyester) ?Personal oil-based products / skin lotions / body lotions removed ?Wigs or  hairpieces removed ?NA ?Smoking or tobacco materials removed ?NA ?Books / newspapers / magazines / loose paper removed ?Cologne, aftershave, perfume and deodorant removed ?Jewelry removed (may wrap wedding band) ?Make-up removed ?Hair care products removed ?Battery operated devices (external) removed ?Heating patches  and chemical warmers removed ?Titanium eyewear removed ?NA Plastic frames ?Nail polish cured greater than 10 hours ?NA ?Casting material cured greater than 10 hours ?NA ?Hearing aids removed ?NA ?Loose dentures or partials removed ?NA ?Prosthetics have been removed ?NA ?Patient demonstrates correct use of air break device (if applicable) ?Patient concerns have been addressed ?Patient grounding bracelet on and cord attached to chamber ?Specifics for Inpatients (complete in addition to above) ?Medication sheet sent with patient ?NA ?Intravenous medications needed or due during therapy sent with patient ?NA ?Drainage tubes (e.g. nasogastric tube or chest tube secured and vented) ?NA ?Endotracheal or Tracheotomy tube secured ?NA ?Cuff deflated of air and inflated with saline ?NA ?Airway suctioned ?NA ?Notes ?Paper version used prior to treatment. ?Electronic Signature(s) ?Signed: 11/25/2021 11:39:41 AM By: [name] CHT EMT BS ?, , ?Entered By: [name] on 11/24/2021 10:45:33 ?

## 2021-11-26 ENCOUNTER — Encounter (HOSPITAL_BASED_OUTPATIENT_CLINIC_OR_DEPARTMENT_OTHER): Payer: BC Managed Care – PPO | Admitting: General Surgery

## 2021-11-26 DIAGNOSIS — Z933 Colostomy status: Secondary | ICD-10-CM | POA: Diagnosis not present

## 2021-11-26 DIAGNOSIS — L89319 Pressure ulcer of right buttock, unspecified stage: Secondary | ICD-10-CM | POA: Diagnosis not present

## 2021-11-27 DIAGNOSIS — L89319 Pressure ulcer of right buttock, unspecified stage: Secondary | ICD-10-CM | POA: Diagnosis not present

## 2021-11-27 DIAGNOSIS — Z933 Colostomy status: Secondary | ICD-10-CM | POA: Diagnosis not present

## 2021-11-29 ENCOUNTER — Encounter (INDEPENDENT_AMBULATORY_CARE_PROVIDER_SITE_OTHER): Payer: Self-pay | Admitting: Family Medicine

## 2021-11-30 ENCOUNTER — Encounter (HOSPITAL_BASED_OUTPATIENT_CLINIC_OR_DEPARTMENT_OTHER): Payer: BC Managed Care – PPO | Attending: Internal Medicine | Admitting: Internal Medicine

## 2021-11-30 DIAGNOSIS — N3041 Irradiation cystitis with hematuria: Secondary | ICD-10-CM | POA: Insufficient documentation

## 2021-11-30 DIAGNOSIS — Z8543 Personal history of malignant neoplasm of ovary: Secondary | ICD-10-CM | POA: Diagnosis not present

## 2021-11-30 DIAGNOSIS — C549 Malignant neoplasm of corpus uteri, unspecified: Secondary | ICD-10-CM | POA: Diagnosis not present

## 2021-11-30 DIAGNOSIS — E119 Type 2 diabetes mellitus without complications: Secondary | ICD-10-CM | POA: Diagnosis not present

## 2021-11-30 DIAGNOSIS — N950 Postmenopausal bleeding: Secondary | ICD-10-CM | POA: Diagnosis not present

## 2021-11-30 DIAGNOSIS — L59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: Secondary | ICD-10-CM | POA: Diagnosis not present

## 2021-11-30 DIAGNOSIS — D709 Neutropenia, unspecified: Secondary | ICD-10-CM | POA: Insufficient documentation

## 2021-11-30 DIAGNOSIS — Z853 Personal history of malignant neoplasm of breast: Secondary | ICD-10-CM | POA: Diagnosis not present

## 2021-11-30 DIAGNOSIS — C50212 Malignant neoplasm of upper-inner quadrant of left female breast: Secondary | ICD-10-CM | POA: Insufficient documentation

## 2021-11-30 LAB — GLUCOSE, CAPILLARY
Glucose-Capillary: 146 mg/dL — ABNORMAL HIGH (ref 70–99)
Glucose-Capillary: 136 mg/dL — ABNORMAL HIGH (ref 70–99)

## 2021-11-30 NOTE — Progress Notes (Signed)
TOWANDA, HORNSTEIN ([id number]) ?Visit Report for 11/30/2021 ?SuperBill Details ?Patient Name: Date of Service: ?[name], [name] 11/30/2021 ?Medical Record Number: [id number] ?Patient Account Number: [id number] ?Date of Birth/Sex: Treating RN: ?06-20-1956 (66 y.o. 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 ?Facility Procedures ?CPT4 Code Description Modifier Quantity ?[id number] G0277-(Facility Use Only) HBOT full body chamber, 58mn ?, 4 ?ICD-10 Diagnosis Description ?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 ?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 ?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, unspecified ?N95.0 Postmenopausal bleeding ?Physician Procedures ?Quantity ?CPT4 Code Description Modifier ?[id number]- WC PHYS HYPERBARIC OXYGEN THERAPY 1 ?ICD-10 Diagnosis Description ?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

## 2021-11-30 NOTE — Telephone Encounter (Signed)
Please advise patient.  

## 2021-11-30 NOTE — Progress Notes (Signed)
[name], [name] ([id number]) ?Visit Report for 11/30/2021 ?[name] Details ?Patient Name: Date of Service: ?[name], [name] 11/30/2021 10:00 A M ?Medical Record Number: [id number] ?Patient Account Number: [id number] ?Date of Birth/Sex: Treating RN: ?22-Oct-1955 (66 y.o. 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Treatment Segments: 4 ?Vital Signs ?Capillary Blood Glucose Reference Range: 80 - 120 mg / dl ?[name] Diabetic Blood Glucose Intervention Range: <131 mg/dl or >249 mg/dl ?Time Vitals Blood Respiratory Capillary Blood Glucose Pulse Action ?Type: ?Pulse: Temperature: ?Taken: ?Pressure: ?Rate: ?Glucose (mg/dl): ?Meter #: Oximetry (%) Taken: ?Pre 09:45 135/65 60 18 97.7 146 2 ?Post 12:21 142/74 62 20 97.4 136 2 ?Treatment Response ?Treatment Toleration: Well ?Treatment Completion Status: Treatment Completed without Adverse Event ?Physician [name] Attestation: ?I certify that I supervised this [name] treatment in accordance with Medicare ?guidelines. A trained  emergency response team is readily available per Yes ?hospital policies and procedures. ?Continue HBOT as ordered. Yes ?Electronic Signature(s) ?Signed: 11/30/2021 4:19:51 PM By: [name] DO ?Entered By: [name] on 11/30/2021 16:13:44 ?-------------------------------------------------------------------------------- ?[name] Safety Checklist Details ?Patient Name: ?Date of Service: ?[name], [name] 11/30/2021 10:00 A M ?Medical Record Number: [id number] ?Patient Account Number: [id number] ?Date of Birth/Sex: ?Treating RN: ?[birth year] (66 y.o. [name], [name] ?Primary Care Robertlee Rogacki: [name] ?Other Clinician: Levan Hurst ?Referring Modupe Shampine: ?Treating Jakie Debow/Extender: [name] ?[name] ?[name]: 12 ?[name] Safety Checklist Items ?Safety Checklist ?Consent Form Signed ?Patient voided / foley secured and emptied ?When did you last eato 0800 ?Last dose of injectable or oral agent 11/28/2021 ?Ostomy pouch emptied and vented if applicable Urostomy connected to collection bag ?All implantable devices assessed, documented and approved Silicone Implants ?Intravenous access site secured and place ?NA ?Valuables secured ?Linens and cotton and cotton/polyester blend (less than 51% polyester) ?Personal oil-based products / skin lotions / body lotions removed ?Wigs or hairpieces removed ?NA ?Smoking or tobacco materials removed ?NA ?Books / newspapers / magazines / loose paper removed ?Cologne, aftershave, perfume and deodorant removed ?Jewelry removed (may wrap wedding band) ?Make-up removed ?Hair care products removed ?Battery operated devices (external) removed ?Heating patches and chemical warmers removed ?Titanium eyewear removed ?NA Plastic frames ?Nail polish cured greater than 10 hours ?NA ?Casting material cured greater than 10 hours ?NA ?Hearing aids removed ?NA ?Loose dentures or partials removed ?NA ?Prosthetics have been removed ?NA ?Patient demonstrates correct use  of air break device (if applicable) ?Patient concerns have been addressed ?Patient grounding bracelet on and cord attached to chamber ?Specifics for Inpatients (complete in addition to above) ?Medication sheet sent with patient ?NA ?Intravenous medications needed or due during therapy sent with patient ?NA ?Drainage tubes (e.g. nasogastric tube  or chest tube secured and vented) ?NA ?Endotracheal or Tracheotomy tube secured ?NA ?Cuff deflated of air and inflated with saline ?NA ?Airway suctioned ?NA ?Notes ?Paper version prior to treatment. ?Electronic Signature(s) ?Signed: 11/30/2021 6:10:36 PM By: [name] CHT EMT BS ?, , ?Entered By: [name] on 11/30/2021 10:35:49 ?

## 2021-11-30 NOTE — Telephone Encounter (Signed)
Please advise 

## 2021-11-30 NOTE — Progress Notes (Signed)
LAURREN, LEPKOWSKI ([id number]) ?Visit Report for 11/30/2021 ?Arrival Information Details ?Patient Name: Date of Service: ?[name], [name] 11/30/2021 10:00 A M ?Medical Record Number: [id number] ?Patient Account Number: [id number] ?Date of Birth/Sex: Treating RN: ?1956/01/15 (66 y.o. [name], [name] ?Primary Care Wellington Winegarden: [name] ?Other Clinician: [name] ?Referring Haruki Arnold: ?Treating Rasha Ibe/Extender: [name] ?[name] ?[name]: 12 ?Visit Information History Since Last Visit ?All ordered tests and consults were completed: Yes ?Patient Arrived: Ambulatory ?Added or deleted any medications: No ?Arrival Time: 09:40 ?Any new allergies or adverse reactions: No ?Accompanied By: self ?Had a fall or experienced change in No ?Transfer Assistance: None ?activities of daily living that may affect ?Patient Identification Verified: Yes ?risk of falls: ?Secondary Verification Process Completed: Yes ?Signs or symptoms of abuse/neglect since last visito No ?Patient Requires Transmission-Based Precautions: No ?Hospitalized since last visit: No ?Patient Has Alerts: Yes ?Implantable device outside of the clinic excluding No ?Patient Alerts: Patient on Blood Thinner cellular tissue based products placed in the center ?since last visit: ?Pain Present Now: No ?Electronic Signature(s) ?Signed: 11/30/2021 6:10:36 PM By: [name] CHT EMT BS ?, , ?Entered By: [name] on 11/30/2021 10:32:10 ?-------------------------------------------------------------------------------- ?Encounter Discharge Information Details ?Patient Name: Date of Service: ?[name], [name] 11/30/2021 10:00 A M ?Medical Record Number: [id number] ?Patient Account Number: [id number] ?Date of Birth/Sex: Treating RN: ?1956-05-31 (66 y.o. [name], [name] ?Primary Care Danetra Glock: [name] ?Other Clinician: Levan Hurst ?Referring Cheralyn Oliver: ?Treating Aldean Pipe/Extender: [name] ?[name] ?[name]: 12 ?Encounter Discharge Information Items ?Discharge Condition: Stable ?Ambulatory Status: Ambulatory ?Discharge Destination: Home ?Transportation: Private Auto ?Accompanied By: self ?Schedule Follow-up Appointment: No ?Clinical Summary of Care: ?Electronic Signature(s) ?Signed: 11/30/2021 6:10:36 PM By: [name] CHT EMT BS ?, , ?Entered By: [name] on 11/30/2021 12:46:30 ?-------------------------------------------------------------------------------- ?Vitals Details ?Patient Name: ?Date of Service: ?[name], [name] 11/30/2021 10:00 A M ?Medical Record Number: [id number] ?Patient Account Number: [id number] ?Date of Birth/Sex: ?Treating RN: ?Aug 03, 1955 (66 y.o. [name], [name] ?Primary Care Dai Mcadams: [name] ?Other Clinician: Levan Hurst ?Referring Krimson Massmann: ?Treating Kadir Azucena/Extender: [name] ?[name] ?[name]: 12 ?Vital Signs ?Time Taken: 09:45 ?Temperature (??F): 97.7 ?Height (in): 62 ?Pulse (bpm): 60 ?Weight (lbs): 160 ?Respiratory Rate (breaths/min): 18 ?Body Mass Index (BMI): 29.3 ?Blood Pressure (mmHg): 135/65 ?Capillary Blood Glucose (mg/dl): 146 ?Reference Range: 80 - 120 mg / dl ?Electronic Signature(s) ?Signed: 11/30/2021 6:10:36 PM By: [name] CHT EMT BS ?, , ?Entered By: [name] on 11/30/2021 10:33:26 ?

## 2021-12-01 ENCOUNTER — Encounter (HOSPITAL_BASED_OUTPATIENT_CLINIC_OR_DEPARTMENT_OTHER): Payer: BC Managed Care – PPO | Admitting: Internal Medicine

## 2021-12-01 DIAGNOSIS — L59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: Secondary | ICD-10-CM | POA: Diagnosis not present

## 2021-12-01 DIAGNOSIS — N950 Postmenopausal bleeding: Secondary | ICD-10-CM | POA: Diagnosis not present

## 2021-12-01 DIAGNOSIS — C50212 Malignant neoplasm of upper-inner quadrant of left female breast: Secondary | ICD-10-CM | POA: Diagnosis not present

## 2021-12-01 DIAGNOSIS — Z853 Personal history of malignant neoplasm of breast: Secondary | ICD-10-CM | POA: Diagnosis not present

## 2021-12-01 DIAGNOSIS — Z8543 Personal history of malignant neoplasm of ovary: Secondary | ICD-10-CM | POA: Diagnosis not present

## 2021-12-01 DIAGNOSIS — D709 Neutropenia, unspecified: Secondary | ICD-10-CM | POA: Diagnosis not present

## 2021-12-01 DIAGNOSIS — N3041 Irradiation cystitis with hematuria: Secondary | ICD-10-CM | POA: Diagnosis not present

## 2021-12-01 DIAGNOSIS — C549 Malignant neoplasm of corpus uteri, unspecified: Secondary | ICD-10-CM | POA: Diagnosis not present

## 2021-12-01 DIAGNOSIS — E119 Type 2 diabetes mellitus without complications: Secondary | ICD-10-CM | POA: Diagnosis not present

## 2021-12-01 LAB — GLUCOSE, CAPILLARY
Glucose-Capillary: 127 mg/dL — ABNORMAL HIGH (ref 70–99)
Glucose-Capillary: 137 mg/dL — ABNORMAL HIGH (ref 70–99)
Glucose-Capillary: 136 mg/dL — ABNORMAL HIGH (ref 70–99)

## 2021-12-01 NOTE — Progress Notes (Addendum)
SKYLIE, HIOTT ([id number]) ?Visit Report for 12/01/2021 ?[name] Details ?Patient Name: Date of Service: ?[name], [name] 12/01/2021 10:00 A M ?Medical Record Number: [id number] ?Patient Account Number: [id number] ?Date of Birth/Sex: Treating RN: ?[birth year] (66 y.o. 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Treatment Segments: 4 ?Vital Signs ?Capillary Blood Glucose Reference Range: 80 - 120 mg / dl ?[name] Diabetic Blood Glucose Intervention Range: <131 mg/dl or >249 mg/dl ?Type: Time Vitals Blood Pulse: Respiratory Temperature: Capillary Blood Glucose Pulse Action ?Taken: ?Pressure: ?Rate: ?Glucose (mg/dl): Meter #: Oximetry (%) Taken: ?Pre 10:47 126/65 66 18 97.8 127 2 none per protocol ?Post 13:21 144/104 62 18 97.7 136 2 remeasured BP with manual cuff ?Treatment Response ?Treatment Toleration: Well ?Treatment Completion Status: Treatment Completed without Adverse Event ?Physician [name] Attestation: ?I certify that I supervised this [name] treatment  in accordance with Medicare ?guidelines. A trained emergency response team is readily available per Yes ?hospital policies and procedures. ?Continue HBOT as ordered. Yes ?Electronic Signature(s) ?Signed: 12/01/2021 4:10:51 PM By: [name] DO ?Previous Signature: 12/01/2021 1:40:03 PM Version By: [name] CHT EMT BS ?, , ?Entered By: [name] on 12/01/2021 16:10:27 ?-------------------------------------------------------------------------------- ?[name] Safety Checklist Details ?Patient Name: ?Date of Service: ?[name], [name] 12/01/2021 10:00 A M ?Medical Record Number: [id number] ?Patient Account Number: [id number] ?Date of Birth/Sex: ?Treating RN: ?1955/11/29 (66 y.o. [name], [name] ?Primary Care Michah Minton: [name] ?Other Clinician: [name] ?Referring Jakaden Ouzts: ?Treating Garrett Mitchum/Extender: [name] ?[name] ?[name]: 12 ?[name] Safety Checklist Items ?Safety Checklist ?Consent Form Signed ?Patient voided / foley secured and emptied ?When did you last eato 0730 ?Last dose of injectable or oral agent 11/28/2021 ?Ostomy pouch emptied and vented if applicable Urostomy connected to collection bag ?All implantable devices assessed, documented and approved Silicone implants ?Intravenous access site secured and place ?NA ?Valuables secured ?Linens and cotton and cotton/polyester blend (less than 51% polyester) ?Personal oil-based products / skin lotions / body lotions removed ?Wigs or hairpieces removed ?NA ?Smoking or tobacco materials removed ?NA ?Books / newspapers / magazines / loose paper removed ?Cologne, aftershave, perfume and deodorant removed ?Jewelry removed (may wrap wedding band) ?Make-up removed ?Hair care products removed ?Battery operated devices (external) removed ?Heating patches and chemical warmers removed ?Titanium eyewear removed ?NA Plastic frames ?Nail polish cured greater than 10 hours ?NA ?Casting material cured greater than 10  hours ?NA ?Hearing aids removed ?NA ?Loose dentures or partials removed ?NA ?Prosthetics have been removed ?NA ?Patient demonstrates correct use of air break device (if applicable) ?Patient concerns have been addressed ?Patient grounding bracelet on and cord attached to chamber ?Specifics for Inpatients (complete in addition to above) ?  Medication sheet sent with patient ?NA ?Intravenous medications needed or due during therapy sent with patient ?NA ?Drainage tubes (e.g. nasogastric tube or chest tube secured and vented) ?NA ?Endotracheal or Tracheotomy tube secured ?NA ?Cuff deflated of air and inflated with saline ?NA ?Airway suctioned ?NA ?Notes ?Paper version used prior to treatment. ?Electronic Signature(s) ?Signed: 12/01/2021 11:57:32 AM By: [name] CHT EMT BS ?, , ?Entered By: [name] on 12/01/2021 11:57:32 ?

## 2021-12-01 NOTE — Progress Notes (Addendum)
[name], [name] ([id number]) ?Visit Report for 12/01/2021 ?Arrival Information Details ?Patient Name: Date of Service: ?[name], [name] 12/01/2021 10:00 A M ?Medical Record Number: [id number] ?Patient Account Number: [id number] ?Date of Birth/Sex: Treating RN: ?Apr 14, 1956 (66 y.o. [name], [name] ?Primary Care Imad Shostak: [name] ?Other Clinician: [name] ?Referring Arely Tinner: ?Treating Diani Jillson/Extender: [name] ?[name] ?[name]: 12 ?Visit Information History Since Last Visit ?All ordered tests and consults were completed: Yes ?Patient Arrived: Ambulatory ?Added or deleted any medications: No ?Arrival Time: 10:17 ?Any new allergies or adverse reactions: No ?Accompanied By: self ?Had a fall or experienced change in No ?Transfer Assistance: None ?activities of daily living that may affect ?Patient Identification Verified: Yes ?risk of falls: ?Secondary Verification Process Completed: Yes ?Signs or symptoms of abuse/neglect since last visito No ?Patient Requires Transmission-Based Precautions: No ?Hospitalized since last visit: No ?Patient Has Alerts: Yes ?Implantable device outside of the clinic excluding No ?Patient Alerts: Patient on Blood Thinner cellular tissue based products placed in the center ?since last visit: ?Pain Present Now: No ?Electronic Signature(s) ?Signed: 12/01/2021 11:54:18 AM By: [name] CHT EMT BS ?, , ?Entered By: [name] on 12/01/2021 11:54:18 ?-------------------------------------------------------------------------------- ?Vitals Details ?Patient Name: Date of Service: ?[name], [name] 12/01/2021 10:00 A M ?Medical Record Number: [id number] ?Patient Account Number: [id number] ?Date of Birth/Sex: Treating RN: ?Jun 13, 1956 (66 y.o. [name], [name] ?Primary Care Geri Hepler: [name] ?Other Clinician: [name] ?Referring Zenia Guest: ?Treating Triston Lisanti/Extender: [name] ?[name] ?Weeks in  Treatment: 12 ?Vital Signs ?Time Taken: 10:47 ?Temperature (??F): 97.8 ?Height (in): 62 ?Pulse (bpm): 66 ?Weight (lbs): 160 ?Respiratory Rate (breaths/min): 18 ?Body Mass Index (BMI): 29.3 ?Blood Pressure (mmHg): 126/65 ?Capillary Blood Glucose (mg/dl): 127 ?Reference Range: 80 - 120 mg / dl ?Electronic Signature(s) ?Signed: 12/01/2021 11:54:54 AM By: [name] CHT EMT BS ?, , ?Entered By: [name] on 12/01/2021 11:54:53 ?

## 2021-12-01 NOTE — Telephone Encounter (Signed)
FYI

## 2021-12-01 NOTE — Progress Notes (Signed)
[name], [name] ([id number]) ?Visit Report for 12/01/2021 ?SuperBill Details ?Patient Name: Date of Service: ?[name], [name] 12/01/2021 ?Medical Record Number: [id number] ?Patient Account Number: [id number] ?Date of Birth/Sex: Treating RN: ?30-Apr-1956 (66 y.o. 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 ?Facility Procedures ?CPT4 Code Description Modifier Quantity ?92010071 G0277-(Facility Use Only) HBOT full body chamber, 69mn ?, 4 ?ICD-10 Diagnosis Description ?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 ?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 ?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, unspecified ?N95.0 Postmenopausal bleeding ?Physician Procedures ?Quantity ?CPT4 Code Description Modifier ?[id number]- WC PHYS HYPERBARIC OXYGEN THERAPY 1 ?ICD-10 Diagnosis Description ?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

## 2021-12-02 ENCOUNTER — Encounter (HOSPITAL_BASED_OUTPATIENT_CLINIC_OR_DEPARTMENT_OTHER): Payer: BC Managed Care – PPO | Admitting: General Surgery

## 2021-12-02 DIAGNOSIS — C50212 Malignant neoplasm of upper-inner quadrant of left female breast: Secondary | ICD-10-CM | POA: Diagnosis not present

## 2021-12-02 DIAGNOSIS — N950 Postmenopausal bleeding: Secondary | ICD-10-CM | POA: Diagnosis not present

## 2021-12-02 DIAGNOSIS — D709 Neutropenia, unspecified: Secondary | ICD-10-CM | POA: Diagnosis not present

## 2021-12-02 DIAGNOSIS — E119 Type 2 diabetes mellitus without complications: Secondary | ICD-10-CM | POA: Diagnosis not present

## 2021-12-02 DIAGNOSIS — L59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: Secondary | ICD-10-CM | POA: Diagnosis not present

## 2021-12-02 DIAGNOSIS — Z853 Personal history of malignant neoplasm of breast: Secondary | ICD-10-CM | POA: Diagnosis not present

## 2021-12-02 DIAGNOSIS — N3041 Irradiation cystitis with hematuria: Secondary | ICD-10-CM | POA: Diagnosis not present

## 2021-12-02 DIAGNOSIS — C549 Malignant neoplasm of corpus uteri, unspecified: Secondary | ICD-10-CM | POA: Diagnosis not present

## 2021-12-02 DIAGNOSIS — Z8543 Personal history of malignant neoplasm of ovary: Secondary | ICD-10-CM | POA: Diagnosis not present

## 2021-12-02 LAB — GLUCOSE, CAPILLARY
Glucose-Capillary: 149 mg/dL — ABNORMAL HIGH (ref 70–99)
Glucose-Capillary: 186 mg/dL — ABNORMAL HIGH (ref 70–99)

## 2021-12-02 NOTE — Progress Notes (Signed)
[name], [name] ([id number]) ?Visit Report for 12/02/2021 ?SuperBill Details ?Patient Name: Date of Service: ?[name], [name] 12/02/2021 ?Medical Record Number: [id number] ?Patient Account Number: [id number] ?Date of Birth/Sex: Treating RN: ?Nov 19, 1955 (66 y.o. 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, unspecified ?N95.0 Postmenopausal bleeding ?D70.9 Neutropenia, unspecified ?E11.9 Type 2 diabetes mellitus without complications ?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 ?Facility Procedures ?CPT4 Code Description Modifier Quantity ?[id number] G0277-(Facility Use Only) HBOT full body chamber, 60mn ?, 4 ?ICD-10 Diagnosis Description ?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 ?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 ?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, unspecified ?N95.0 Postmenopausal bleeding ?Physician Procedures ?Quantity ?CPT4 Code Description Modifier ?[id number]- WC PHYS HYPERBARIC OXYGEN THERAPY 1 ?ICD-10 Diagnosis Description ?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

## 2021-12-02 NOTE — Progress Notes (Signed)
[name], [name] ([id number]) ?Visit Report for 12/02/2021 ?[name] Details ?Patient Name: Date of Service: ?[name], [name] 12/02/2021 10:00 A M ?Medical Record Number: [id number] ?Patient Account Number: [id number] ?Date of Birth/Sex: Treating RN: ?1956/02/20 (66 y.o. 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Treatment Segments: 4 ?Vital Signs ?Capillary Blood Glucose Reference Range: 80 - 120 mg / dl ?[name] Diabetic Blood Glucose Intervention Range: <131 mg/dl or >249 mg/dl ?Time Vitals Blood Respiratory Capillary Blood Glucose Pulse Action ?Type: ?Pulse: Temperature: ?Taken: ?Pressure: ?Rate: ?Glucose (mg/dl): ?Meter #: Oximetry (%) Taken: ?Pre 09:59 113/45 72 18 98.5 149 ?Post 12:31 119/72 68 18 97.7 186 ?Treatment Response ?Treatment Toleration: Well ?Treatment Completion Status: Treatment Completed without Adverse Event ?Physician [name] Attestation: ?I certify that I supervised this [name] treatment in accordance with Medicare ?guidelines. A trained  emergency response team is readily available per Yes ?hospital policies and procedures. ?Continue HBOT as ordered. Yes ?Electronic Signature(s) ?Signed: 12/02/2021 5:12:31 PM By: [name] Maudlin MD FACS ?Previous Signature: 12/02/2021 1:31:43 PM Version By: [name] CHT EMT BS ?, , ?Entered By: [name] on 12/02/2021 17:12:31 ?-------------------------------------------------------------------------------- ?[name] Safety Checklist Details ?Patient Name: ?Date of Service: ?[name] [name], [name] 12/02/2021 10:00 A M ?Medical Record Number: [id number] ?Patient Account Number: [id number] ?Date of Birth/Sex: ?Treating RN: ?[birth year] (66 y.o. [name], [name] ?Primary Care [name]: [name] ?Other Clinician: Donavan [name] ?Referring [name]: ?Treating [name]/Extender: [name] ?[name] ?[name]: 12 ?[name] Safety Checklist Items ?Safety Checklist ?Consent Form Signed ?Patient voided / foley secured and emptied ?When did you last eato 0900 ?Last dose of injectable or oral agent 11/28/2021 ?Ostomy pouch emptied and vented if applicable Urostomy connected to collection bag ?All implantable devices assessed, documented and approved silicone implants ?Intravenous access site secured and place ?NA ?Valuables secured ?Linens and cotton and cotton/polyester blend (less than 51% polyester) ?Personal oil-based products / skin lotions / body lotions removed ?Wigs or hairpieces removed ?NA ?Smoking or tobacco materials removed ?NA ?Books / newspapers / magazines / loose paper removed ?Cologne, aftershave, perfume and deodorant removed ?Jewelry removed (may wrap wedding band) ?Make-up removed ?Hair care products removed ?Battery operated devices (external) removed ?Heating patches and chemical warmers removed ?Titanium eyewear removed ?NA Plastic frames ?Nail polish cured greater than 10 hours ?NA ?Casting material cured greater than 10 hours ?NA ?Hearing aids removed ?NA ?Loose  dentures or partials removed ?NA ?Prosthetics have been removed ?NA ?Patient demonstrates correct use of air break device (if applicable) ?Patient concerns have been addressed ?Patient grounding bracelet on and cord attached to chamber ?Specifics for Inpatients (complete in addition to above) ?Medication sheet sent with patient ?NA ?Intravenous medications needed  or due during therapy sent with patient ?NA ?Drainage tubes (e.g. nasogastric tube or chest tube secured and vented) ?NA ?Endotracheal or Tracheotomy tube secured ?NA ?Cuff deflated of air and inflated with saline ?NA ?Airway suctioned ?NA ?Notes ?Paper version used prior to treatment. ?Electronic Signature(s) ?Signed: 12/02/2021 1:31:43 PM By: [name] CHT EMT BS ?, , ?Entered By: [name] on 12/02/2021 10:27:06 ?

## 2021-12-02 NOTE — Progress Notes (Signed)
VERNELLA, NIZNIK ([id number]) ?Visit Report for 12/02/2021 ?Arrival Information Details ?Patient Name: Date of Service: ?[name], [name] 12/02/2021 10:00 A M ?Medical Record Number: [id number] ?Patient Account Number: [id number] ?Date of Birth/Sex: Treating RN: ?Mar 29, 1956 (66 y.o. [name], [name] ?Primary Care Dehlia Kilner: [name] ?Other Clinician: [name] ?Referring Dontravious Camille: ?Treating Janiel Crisostomo/Extender: [name] ?[name] ?[name]: 12 ?Visit Information History Since Last Visit ?All ordered tests and consults were completed: Yes ?Patient Arrived: Ambulatory ?Added or deleted any medications: No ?Arrival Time: 09:37 ?Any new allergies or adverse reactions: No ?Accompanied By: self ?Had a fall or experienced change in No ?Transfer Assistance: None ?activities of daily living that may affect ?Patient Identification Verified: Yes ?risk of falls: ?Secondary Verification Process Completed: Yes ?Signs or symptoms of abuse/neglect since last visito No ?Patient Requires Transmission-Based Precautions: No ?Hospitalized since last visit: No ?Patient Has Alerts: Yes ?Implantable device outside of the clinic excluding No ?Patient Alerts: Patient on Blood Thinner cellular tissue based products placed in the center ?since last visit: ?Pain Present Now: No ?Electronic Signature(s) ?Signed: 12/02/2021 1:31:43 PM By: [name] CHT EMT BS ?, , ?Entered By: [name] on 12/02/2021 10:22:37 ?-------------------------------------------------------------------------------- ?Encounter Discharge Information Details ?Patient Name: Date of Service: ?[name], [name] 12/02/2021 10:00 A M ?Medical Record Number: [id number] ?Patient Account Number: [id number] ?Date of Birth/Sex: Treating RN: ?1955-11-04 (66 y.o. [name], [name] ?Primary Care Ziyah Cordoba: [name] ?Other Clinician: [name] ?Referring Angelyne Terwilliger: ?Treating Malaina Mortellaro/Extender: [name] ?[name] ?[name]: 12 ?Encounter Discharge Information Items ?Discharge Condition: Stable ?Ambulatory Status: Ambulatory ?Discharge Destination: Home ?Transportation: Private Auto ?Accompanied By: self ?Schedule Follow-up Appointment: No ?Clinical Summary of Care: ?Electronic Signature(s) ?Signed: 12/02/2021 1:31:43 PM By: [name] CHT EMT BS ?, , ?Entered By: [name] on 12/02/2021 13:31:10 ?-------------------------------------------------------------------------------- ?Vitals Details ?Patient Name: ?Date of Service: ?[name] [name], [name] 12/02/2021 10:00 A M ?Medical Record Number: [id number] ?Patient Account Number: [id number] ?Date of Birth/Sex: ?Treating RN: ?[birth year] (66 y.o. [name], [name] ?Primary Care Laurice Iglesia: [name] ?Other Clinician: [name] ?Referring Henrik Orihuela: ?Treating Arleene Settle/Extender: [name] ?[name] ?[name]: 12 ?Vital Signs ?Time Taken: 09:59 ?Temperature (??F): 98.5 ?Height (in): 62 ?Pulse (bpm): 72 ?Weight (lbs): 160 ?Respiratory Rate (breaths/min): 18 ?Body Mass Index (BMI): 29.3 ?Blood Pressure (mmHg): 113/45 ?Capillary Blood Glucose (mg/dl): 149 ?Reference Range: 80 - 120 mg / dl ?Electronic Signature(s) ?Signed: 12/02/2021 1:31:43 PM By: [name] CHT EMT BS ?, , ?Entered By: [name] on 12/02/2021 10:24:05 ?

## 2021-12-03 ENCOUNTER — Encounter (HOSPITAL_BASED_OUTPATIENT_CLINIC_OR_DEPARTMENT_OTHER): Payer: BC Managed Care – PPO | Admitting: General Surgery

## 2021-12-03 DIAGNOSIS — D709 Neutropenia, unspecified: Secondary | ICD-10-CM | POA: Diagnosis not present

## 2021-12-03 DIAGNOSIS — N3041 Irradiation cystitis with hematuria: Secondary | ICD-10-CM | POA: Diagnosis not present

## 2021-12-03 DIAGNOSIS — E119 Type 2 diabetes mellitus without complications: Secondary | ICD-10-CM | POA: Diagnosis not present

## 2021-12-03 DIAGNOSIS — C549 Malignant neoplasm of corpus uteri, unspecified: Secondary | ICD-10-CM | POA: Diagnosis not present

## 2021-12-03 DIAGNOSIS — Z853 Personal history of malignant neoplasm of breast: Secondary | ICD-10-CM | POA: Diagnosis not present

## 2021-12-03 DIAGNOSIS — C50212 Malignant neoplasm of upper-inner quadrant of left female breast: Secondary | ICD-10-CM | POA: Diagnosis not present

## 2021-12-03 DIAGNOSIS — N950 Postmenopausal bleeding: Secondary | ICD-10-CM | POA: Diagnosis not present

## 2021-12-03 DIAGNOSIS — L59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: Secondary | ICD-10-CM | POA: Diagnosis not present

## 2021-12-03 DIAGNOSIS — Z8543 Personal history of malignant neoplasm of ovary: Secondary | ICD-10-CM | POA: Diagnosis not present

## 2021-12-03 LAB — GLUCOSE, CAPILLARY
Glucose-Capillary: 129 mg/dL — ABNORMAL HIGH (ref 70–99)
Glucose-Capillary: 137 mg/dL — ABNORMAL HIGH (ref 70–99)
Glucose-Capillary: 139 mg/dL — ABNORMAL HIGH (ref 70–99)

## 2021-12-03 NOTE — Progress Notes (Addendum)
[name], [name] ([id number]) ?Visit Report for 12/03/2021 ?[name] Details ?Patient Name: Date of Service: ?[name], [name] ULA [name]. 12/03/2021 10:00 A M ?Medical Record Number: [id number] ?Patient Account Number: [id number] ?Date of Birth/Sex: Treating RN: ?April 01, 1956 (66 y.o. 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Treatment Segments: 4 ?Vital Signs ?Capillary Blood Glucose Reference Range: 80 - 120 mg / dl ?[name] Diabetic Blood Glucose Intervention Range: <131 mg/dl or >249 mg/dl ?Type: Time Vitals Blood Pulse: Respiratory Temperature: Capillary Blood Glucose Pulse Action ?Taken: ?Pressure: ?Rate: ?Glucose (mg/dl): Meter #: Oximetry (%) Taken: ?Pre 10:06 111/66 68 18 98.1 129 2 Patient given 8 oz Glucerna Shake ?Pre 10:35 137 2 Cleared for [name] treatment. ?Post 12:57 138/60 63 18 98.4 139 ?Treatment Response ?Treatment Toleration: Well ?Treatment Completion Status: Treatment Completed without Adverse Event ?Treatment Notes ?Patient's blood glucose was 129  mg/dL. Patient was given 8 oz Glucerna, consuming by 1010. Blood glucose was rechecked at 1035 at 137 mg/dL. Patient ?travelled at rate set of 1.0 psi/min with purge flow rate on 410 LPM with actual average rate of 0.77 psi/min (19 min travel time). [facility] ?The patient had no problems noted during her treatment today. ?Physician [name] Attestation: ?I certify that I supervised this [name] treatment in accordance with Medicare ?guidelines. A trained emergency response team is readily available per Yes ?hospital policies and procedures. ?Continue HBOT as ordered. Yes ?Electronic Signature(s) ?Signed: 12/03/2021 1:08:57 PM By: [name] EMT ?Signed: 12/04/2021 7:39:53 AM By: [name] MD FACS ?Previous Signature: 12/03/2021 12:56:22 PM Version By: [name] MD FACS ?Previous Signature: 12/03/2021 11:14:50 AM Version By: [name] CHT EMT BS ?, , ?Previous Signature: 12/03/2021 11:14:15 AM Version By: [name] CHT EMT BS ?, , ?Entered By: [name] on 12/03/2021 13:08:56 ?-------------------------------------------------------------------------------- ?[name] Safety Checklist Details ?Patient Name: ?Date of Service: ?[name], [name]. 12/03/2021 10:00 A M ?Medical Record Number: [id number] ?Patient Account Number: [id number] ?Date of Birth/Sex: ?Treating RN: ?07/02/1956 (66 y.o. [name], [name] ?Primary Care Camdon Saetern: [name] ?Other Clinician: [name] ?Referring Bennet Kujawa: ?Treating Adith Tejada/Extender: [name] ?[name] ?[name]: 13 ?[name] Safety Checklist Items ?Safety Checklist ?Consent Form Signed ?Patient voided / foley secured and emptied ?When did you last eato 0800 ?Last dose of injectable or oral agent 11/28/2021 ?Ostomy pouch emptied and vented if applicable ?NA Urostomy connected to collection bag ?All implantable devices assessed, documented and approved ?NA Silicone implants ?Intravenous access site secured and place ?NA ?Valuables  secured ?Linens and cotton and cotton/polyester blend (less than 51% polyester) ?Personal oil-based products / skin lotions / body lotions removed ?Wigs or hairpieces removed ?NA ?Smoking or tobacco materials removed ?NA ?Books / newspapers /  magazines / loose paper removed ?Cologne, aftershave, perfume and deodorant removed ?Jewelry removed (may wrap wedding band) ?Make-up removed ?Hair care products removed ?Battery operated devices (external) removed ?Heating patches and chemical warmers removed ?Titanium eyewear removed ?NA Plastic frames ?Nail polish cured greater than 10 hours ?NA ?Casting material cured greater than 10 hours ?NA ?Hearing aids removed ?NA ?Loose dentures or partials removed ?NA ?Prosthetics have been removed ?NA ?Patient demonstrates correct use of air break device (if applicable) ?Patient concerns have been addressed ?Patient grounding bracelet on and cord attached to chamber ?Specifics for Inpatients (complete in addition to above) ?Medication sheet sent with patient ?NA ?Intravenous medications needed or due during therapy sent with patient ?NA ?Drainage tubes (e.g. nasogastric tube or chest tube secured and vented) ?NA ?Endotracheal or Tracheotomy tube secured ?NA ?Cuff deflated of air and inflated with saline ?NA ?Airway suctioned ?NA ?Notes ?Paper version used prior to treatment. ?Electronic Signature(s) ?Signed: 12/03/2021 11:07:37 AM By: [name] CHT EMT BS ?, , ?Previous Signature: 12/03/2021 11:07:14 AM Version By: [name] CHT EMT BS ?, , ?Entered By: [name] on 12/03/2021 11:07:37 ?

## 2021-12-03 NOTE — Progress Notes (Signed)
[name], [name] ([id number]) ?Visit Report for 12/03/2021 ?Arrival Information Details ?Patient Name: Date of Service: ?[name], [name] [name] 12/03/2021 10:00 A M ?Medical Record Number: [id number] ?Patient Account Number: [id number] ?Date of Birth/Sex: Treating RN: ?1956/03/04 (66 y.o. [name], [name] ?Primary Care Matis Monnier: [name] ?Other Clinician: [name] ?Referring Lashan Gluth: ?Treating Shanique Aslinger/Extender: [name] ?[name] ?[name]: 13 ?Visit Information History Since Last Visit ?All ordered tests and consults were completed: Yes ?Patient Arrived: Ambulatory ?Added or deleted any medications: No ?Arrival Time: 09:49 ?Any new allergies or adverse reactions: No ?Accompanied By: self ?Had a fall or experienced change in No ?Transfer Assistance: None ?activities of daily living that may affect ?Patient Identification Verified: Yes ?risk of falls: ?Secondary Verification Process Completed: Yes ?Signs or symptoms of abuse/neglect since last visito No ?Patient Requires Transmission-Based Precautions: No ?Hospitalized since last visit: No ?Patient Has Alerts: Yes ?Implantable device outside of the clinic excluding No ?Patient Alerts: Patient on Blood Thinner cellular tissue based products placed in the center ?since last visit: ?Pain Present Now: No ?Electronic Signature(s) ?Signed: 12/03/2021 11:05:15 AM By: [name] CHT EMT BS ?, , ?Entered By: [name] on 12/03/2021 11:05:15 ?-------------------------------------------------------------------------------- ?Vitals Details ?Patient Name: Date of Service: ?[name], [name] [name] 12/03/2021 10:00 A M ?Medical Record Number: [id number] ?Patient Account Number: [id number] ?Date of Birth/Sex: Treating RN: ?11-23-1955 (66 y.o. [name], [name] ?Primary Care Aniello Christopoulos: [name] ?Other Clinician: [name] ?Referring Brallan Denio: ?Treating Abdulahi Schor/Extender: [name] ?[name] ?Weeks in  Treatment: 13 ?Vital Signs ?Time Taken: 10:06 ?Temperature (??F): 98.1 ?Height (in): 62 ?Pulse (bpm): 68 ?Weight (lbs): 160 ?Respiratory Rate (breaths/min): 18 ?Body Mass Index (BMI): 29.3 ?Blood Pressure (mmHg): 111/66 ?Capillary Blood Glucose (mg/dl): 129 ?Reference Range: 80 - 120 mg / dl ?Electronic Signature(s) ?Signed: 12/03/2021 11:06:00 AM By: [name] CHT EMT BS ?, , ?Entered By: [name] on 12/03/2021 11:06:00 ?

## 2021-12-04 ENCOUNTER — Encounter (HOSPITAL_BASED_OUTPATIENT_CLINIC_OR_DEPARTMENT_OTHER): Payer: BC Managed Care – PPO | Admitting: General Surgery

## 2021-12-04 DIAGNOSIS — L59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: Secondary | ICD-10-CM | POA: Diagnosis not present

## 2021-12-04 DIAGNOSIS — Z8543 Personal history of malignant neoplasm of ovary: Secondary | ICD-10-CM | POA: Diagnosis not present

## 2021-12-04 DIAGNOSIS — N3041 Irradiation cystitis with hematuria: Secondary | ICD-10-CM | POA: Diagnosis not present

## 2021-12-04 DIAGNOSIS — Z853 Personal history of malignant neoplasm of breast: Secondary | ICD-10-CM | POA: Diagnosis not present

## 2021-12-04 DIAGNOSIS — N950 Postmenopausal bleeding: Secondary | ICD-10-CM | POA: Diagnosis not present

## 2021-12-04 DIAGNOSIS — C549 Malignant neoplasm of corpus uteri, unspecified: Secondary | ICD-10-CM | POA: Diagnosis not present

## 2021-12-04 DIAGNOSIS — D709 Neutropenia, unspecified: Secondary | ICD-10-CM | POA: Diagnosis not present

## 2021-12-04 DIAGNOSIS — E119 Type 2 diabetes mellitus without complications: Secondary | ICD-10-CM | POA: Diagnosis not present

## 2021-12-04 DIAGNOSIS — C50212 Malignant neoplasm of upper-inner quadrant of left female breast: Secondary | ICD-10-CM | POA: Diagnosis not present

## 2021-12-04 LAB — GLUCOSE, CAPILLARY
Glucose-Capillary: 147 mg/dL — ABNORMAL HIGH (ref 70–99)
Glucose-Capillary: 128 mg/dL — ABNORMAL HIGH (ref 70–99)

## 2021-12-04 NOTE — Progress Notes (Signed)
LILLIONNA, NABI ([id number]) ?Visit Report for 12/03/2021 ?Problem List Details ?Patient Name: Date of Service: ?[name], [name] 12/03/2021 10:00 A M ?Medical Record Number: [id number] ?Patient Account Number: [id number] ?Date of Birth/Sex: Treating RN: ?1955/08/20 (66 y.o. 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, unspecified 09/03/2021 No Yes ?N95.0 Postmenopausal bleeding 09/03/2021 No Yes ?D70.9 Neutropenia, unspecified 09/03/2021 No Yes ?E11.9 Type 2 diabetes mellitus without complications 02/02/813 No Yes ?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 12/03/2021 ?Medical Record Number: [id number] ?Patient Account Number: [id number] ?Date of Birth/Sex: ?Treating RN: ?1956-06-03 (66 y.o. 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, unspecified ?N95.0 Postmenopausal bleeding ?D70.9 Neutropenia, unspecified ?E11.9 Type 2 diabetes mellitus without complications ?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 ?Facility Procedures ?CPT4 Code: 97026378 ?Description: G0277-(Facility Use Only) HBOT full body chamber, 44mn , ICD-10 Diagnosis Description 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 C54.9  ?Malignant neoplasm of corpus uteri, unspecified N95.0 Postmenopausal bleeding ?Modifier: n ?Quantity: 4 ?Physician Procedures ?: CPT4 Code Description Modifier [id number] PHYS HYPERBARIC OXYGEN THERAPY ICD-10 Diagnosis Description 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

## 2021-12-04 NOTE — Progress Notes (Addendum)
DAWSON, HOLLMAN ([id number]) ?Visit Report for 12/04/2021 ?Arrival Information Details ?Patient Name: Date of Service: ?[name], [name] 12/04/2021 11:00 A M ?Medical Record Number: [id number] ?Patient Account Number: [id number] ?Date of Birth/Sex: Treating RN: ?Jun 10, 1956 (66 y.o. [name], [name] ?Primary Care Terryann Verbeek: [name] ?Other Clinician: [name] ?Referring Neco Kling: ?Treating Baily Serpe/Extender: [name] ?[name] ?[name]: 13 ?Visit Information History Since Last Visit ?All ordered tests and consults were completed: Yes ?Patient Arrived: Ambulatory ?Added or deleted any medications: No ?Arrival Time: 01:14 ?Any new allergies or adverse reactions: No ?Accompanied By: None ?Had a fall or experienced change in No ?Transfer Assistance: None ?activities of daily living that may affect ?Patient Identification Verified: Yes ?risk of falls: ?Secondary Verification Process Completed: Yes ?Signs or symptoms of abuse/neglect since last visito No ?Patient Requires Transmission-Based Precautions: No ?Hospitalized since last visit: No ?Patient Has Alerts: Yes ?Implantable device outside of the clinic excluding No ?Patient Alerts: Patient on Blood Thinner cellular tissue based products placed in the center ?since last visit: ?Pain Present Now: No ?Notes ?Paper version used prior to treatment. ?Electronic Signature(s) ?Signed: 12/04/2021 1:23:09 PM By: [name] EMT ?Previous Signature: 12/04/2021 1:12:40 PM Version By: [name] EMT ?Entered By: [name] on 12/04/2021 13:23:09 ?-------------------------------------------------------------------------------- ?Encounter Discharge Information Details ?Patient Name: Date of Service: ?[name], [name] 12/04/2021 11:00 A M ?Medical Record Number: [id number] ?Patient Account Number: [id number] ?Date of Birth/Sex: Treating RN: ?[birth year] (66 y.o. [name], [name] ?Primary Care Ren Grasse: [name] ?Other Clinician:  [name] ?Referring Arlone Lenhardt: ?Treating Dakota Stangl/Extender: [name] ?[name] ?[name]: 13 ?Encounter Discharge Information Items ?Discharge Condition: Stable ?Ambulatory Status: Ambulatory ?Discharge Destination: Home ?Transportation: Ambulance ?Accompanied By: self ?Schedule Follow-up Appointment: No ?Clinical Summary of Care: ?Electronic Signature(s) ?Signed: 12/04/2021 3:40:15 PM By: [name] CHT EMT BS ?, , ?Entered By: [name] on 12/04/2021 15:40:15 ?-------------------------------------------------------------------------------- ?Vitals Details ?Patient Name: ?Date of Service: ?[name], [name] 12/04/2021 11:00 A M ?Medical Record Number: [id number] ?Patient Account Number: [id number] ?Date of Birth/Sex: ?Treating RN: ?[birth year] (66 y.o. [name], [name] ?Primary Care Tramell Piechota: [name] ?Other Clinician: [name] ?Referring Jaxson Keener: ?Treating Delayna Sparlin/Extender: [name] ?[name] ?[name]: 13 ?Vital Signs ?Time Taken: 11:28 ?Temperature (??F): 97.3 ?Height (in): 62 ?Pulse (bpm): 67 ?Weight (lbs): 160 ?Respiratory Rate (breaths/min): 16 ?Body Mass Index (BMI): 29.3 ?Blood Pressure (mmHg): 118/76 ?Capillary Blood Glucose (mg/dl): 147 ?Reference Range: 80 - 120 mg / dl ?Notes ?Paper version used prior to treatment. ?Electronic Signature(s) ?Signed: 12/04/2021 1:23:21 PM By: [name] EMT ?Previous Signature: 12/04/2021 1:13:27 PM Version By: [name] EMT ?Entered By: [name] on 12/04/2021 13:23:20 ?

## 2021-12-04 NOTE — Progress Notes (Addendum)
BRIANCA, FORTENBERRY ([id number]) ?Visit Report for 12/04/2021 ?[name] Details ?Patient Name: Date of Service: ?[name], [name] 12/04/2021 11:00 A M ?Medical Record Number: [id number] ?Patient Account Number: [id number] ?Date of Birth/Sex: Treating RN: ?[birth year] (66 y.o. 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Treatment Segments: 4 ?Vital Signs ?Capillary Blood Glucose Reference Range: 80 - 120 mg / dl ?[name] Diabetic Blood Glucose Intervention Range: <131 mg/dl or >249 mg/dl ?Type: Time Vitals Blood Pulse: Respiratory Temperature: Capillary Blood Glucose Pulse Action ?Taken: Pressure: Rate: Glucose (mg/dl): Meter #: Oximetry (%) Taken: ?Pre 11:28 118/76 67 16 97.3 147 2 ?Post 13:55 132/78 62 18 97.6 128 2 > or = to 101 mg/dL, discharge per protocol ?Treatment Response ?Treatment Completion Status: Treatment Completed without Adverse Event ?Physician [name] Attestation: ?I certify that I supervised this [name] treatment in accordance with Medicare ?guidelines.  A trained emergency response team is readily available per Yes ?hospital policies and procedures. ?Continue HBOT as ordered. Yes ?Electronic Signature(s) ?Signed: 12/04/2021 3:37:45 PM By: [name] CHT EMT BS ?, , ?Signed: 12/07/2021 7:29:22 AM By: [name] MD FACS ?Previous Signature: 12/04/2021 1:30:26 PM Version By: [name] MD FACS ?Previous Signature: 12/04/2021 1:18:16 PM Version By: [name] EMT ?Entered By: [name] on 12/04/2021 15:37:45 ?-------------------------------------------------------------------------------- ?[name] Safety Checklist Details ?Patient Name: ?Date of Service: ?[name] [name], [name] 12/04/2021 11:00 A M ?Medical Record Number: [id number] ?Patient Account Number: [id number] ?Date of Birth/Sex: ?Treating RN: ?[birth year] (66 y.o. [name], [name] ?Primary Care Alainna Stawicki: [name] ?Other Clinician: [name] ?Referring Vinnie Bobst: ?Treating Gaylon Bentz/Extender: [name] ?[name] ?[name]: 13 ?[name] Safety Checklist Items ?Safety Checklist ?Consent Form Signed ?Patient voided / foley secured and emptied ?When did you last eato 0830 ?Last dose of injectable or oral agent Saturday 11/29/2021 ?Ostomy pouch emptied and vented if applicable Urostomy connected to collection bag ?All implantable devices assessed, documented and approved Silicone Implants ?Intravenous access site secured and place ?NA ?Valuables secured ?Linens and cotton and cotton/polyester blend (less than 51% polyester) ?Personal oil-based products / skin lotions / body lotions removed ?Wigs or hairpieces removed ?NA ?Smoking or tobacco materials removed ?Books / newspapers / magazines / loose paper removed ?Cologne, aftershave, perfume and deodorant removed ?Jewelry removed (may wrap wedding band) ?NA ?Make-up removed ?Hair care products removed ?NA ?Battery operated devices (external) removed ?Heating patches and chemical warmers removed ?Titanium eyewear  removed ?NA Plastic frames ?Nail polish cured greater than 10 hours ?NA ?Casting material cured greater than 10 hours ?NA ?Hearing aids removed ?NA ?Loose dentures or partials removed ?NA ?Prosthetics have been removed ?NA ?Patient demonstrates correct use of air break device (if applicable) ?NA ?Patient  concerns have been addressed ?NA ?Patient grounding bracelet on and cord attached to chamber ?NA ?Specifics for Inpatients (complete in addition to above) ?Medication sheet sent with patient ?NA ?Intravenous medications needed or due during therapy sent with patient ?NA ?Drainage tubes (e.g. nasogastric tube or chest tube secured and vented) ?NA ?Endotracheal or Tracheotomy tube secured ?NA ?Cuff deflated of air and inflated with saline ?NA ?Airway suctioned ?NA ?Notes ?Paper version used prior to treatment. A safety checklist was done before treatment was started. ?Electronic Signature(s) ?Signed: 12/04/2021 1:16:22 PM By: [name] EMT ?Entered By: [name] on 12/04/2021 13:16:22 ?

## 2021-12-07 ENCOUNTER — Encounter (HOSPITAL_BASED_OUTPATIENT_CLINIC_OR_DEPARTMENT_OTHER): Payer: BC Managed Care – PPO | Admitting: Internal Medicine

## 2021-12-07 DIAGNOSIS — D709 Neutropenia, unspecified: Secondary | ICD-10-CM | POA: Diagnosis not present

## 2021-12-07 DIAGNOSIS — L59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: Secondary | ICD-10-CM | POA: Diagnosis not present

## 2021-12-07 DIAGNOSIS — C549 Malignant neoplasm of corpus uteri, unspecified: Secondary | ICD-10-CM

## 2021-12-07 DIAGNOSIS — E119 Type 2 diabetes mellitus without complications: Secondary | ICD-10-CM | POA: Diagnosis not present

## 2021-12-07 DIAGNOSIS — Z8543 Personal history of malignant neoplasm of ovary: Secondary | ICD-10-CM | POA: Diagnosis not present

## 2021-12-07 DIAGNOSIS — N950 Postmenopausal bleeding: Secondary | ICD-10-CM | POA: Diagnosis not present

## 2021-12-07 DIAGNOSIS — Z853 Personal history of malignant neoplasm of breast: Secondary | ICD-10-CM | POA: Diagnosis not present

## 2021-12-07 DIAGNOSIS — C50212 Malignant neoplasm of upper-inner quadrant of left female breast: Secondary | ICD-10-CM | POA: Diagnosis not present

## 2021-12-07 DIAGNOSIS — N3041 Irradiation cystitis with hematuria: Secondary | ICD-10-CM | POA: Diagnosis not present

## 2021-12-07 LAB — GLUCOSE, CAPILLARY
Glucose-Capillary: 151 mg/dL — ABNORMAL HIGH (ref 70–99)
Glucose-Capillary: 146 mg/dL — ABNORMAL HIGH (ref 70–99)

## 2021-12-07 NOTE — Progress Notes (Signed)
AVANELL, BANWART ([id number]) ?Visit Report for 12/04/2021 ?SuperBill Details ?Patient Name: Date of Service: ?[name], [name] 12/04/2021 ?Medical Record Number: [id number] ?Patient Account Number: [id number] ?Date of Birth/Sex: Treating RN: ?1955-10-26 (66 y.o. 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 ?Facility Procedures ?CPT4 Code Description Modifier Quantity ?12878676 G0277-(Facility Use Only) HBOT full body chamber, 9mn ?, 4 ?ICD-10 Diagnosis Description ?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 ?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 ?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, unspecified ?N95.0 Postmenopausal bleeding ?Physician Procedures ?Quantity ?CPT4 Code Description Modifier ?[id number] PHYS HYPERBARIC OXYGEN THERAPY 1 ?ICD-10 Diagnosis Description ?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

## 2021-12-07 NOTE — Progress Notes (Addendum)
[name], HAYTON ([id number]) ?Visit Report for 12/07/2021 ?[name] Details ?Patient Name: Date of Service: ?[name], [name] 12/07/2021 10:00 A M ?Medical Record Number: [id number] ?Patient Account Number: [id number] ?Date of Birth/Sex: Treating RN: ?[birth year] (66 y.o. 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Treatment Segments: 4 ?Vital Signs ?Capillary Blood Glucose Reference Range: 80 - 120 mg / dl ?[name] Diabetic Blood Glucose Intervention Range: <131 mg/dl or >249 mg/dl ?Time Vitals Blood Respiratory Capillary Blood Glucose Pulse Action ?Type: ?Pulse: Temperature: ?Taken: ?Pressure: ?Rate: ?Glucose (mg/dl): ?Meter #: Oximetry (%) Taken: ?Pre 10:23 125/64 74 16 98.1 151 ?Post 12:35 135/67 76 16 98.4 146 ?Treatment Response ?Treatment Toleration: Well ?Treatment Completion Status: Treatment Completed without Adverse Event ?Treatment Notes ?The patient did well today with her treatment. ?Physician [name] Attestation: ?I certify that I supervised this [name]  treatment in accordance with Medicare ?guidelines. A trained emergency response team is readily available per Yes ?hospital policies and procedures. ?Continue HBOT as ordered. Yes ?Electronic Signature(s) ?Signed: 12/08/2021 3:21:24 PM By: [name] DO ?Previous Signature: 12/07/2021 1:06:19 PM Version By: [name] EMT ?Previous Signature: 12/07/2021 10:39:29 AM Version By: [name] EMT ?Entered By: [name] on 12/08/2021 15:19:20 ?-------------------------------------------------------------------------------- ?[name] Safety Checklist Details ?Patient Name: ?Date of Service: ?[name], [name] 12/07/2021 10:00 A M ?Medical Record Number: [id number] ?Patient Account Number: [id number] ?Date of Birth/Sex: ?Treating RN: ?12-02-1955 (66 y.o. [name], [name] ?Primary Care [name] [name]: [name] ?Other Clinician: [name] ?Referring Mckenley Birenbaum: ?Treating Clancy Leiner/Extender: [name] ?[name] ?[name]: 13 ?[name] Safety Checklist Items ?Safety Checklist ?Consent Form Signed ?Patient voided / foley secured and emptied ?When did you last eato 0800 ?Last dose of injectable or oral agent SAturday 12/05/2021 ?Ostomy pouch emptied and vented if applicable ?All implantable devices assessed, documented and approved Silicone Implants ?Intravenous access site secured and place ?NA ?Valuables secured ?Linens and cotton and cotton/polyester blend (less than 51% polyester) ?Personal oil-based products / skin lotions / body lotions removed ?Wigs or hairpieces removed ?NA ?Smoking or tobacco materials removed ?Books / newspapers / magazines / loose paper removed ?Cologne, aftershave, perfume and deodorant removed ?Jewelry removed (may wrap wedding band) ?NA ?Make-up removed ?Hair care products removed ?Battery operated devices (external) removed ?Heating patches and chemical warmers removed ?Titanium eyewear removed ?NA Plastic frames ?Nail polish cured greater than 10  hours ?NA ?Casting material cured greater than 10 hours ?NA ?Hearing aids removed ?NA ?Loose dentures or partials removed ?NA ?Prosthetics have been removed ?NA ?Patient demonstrates correct use of air break device (if applicable) ?Patient concerns have been addressed ?Patient grounding bracelet on and cord attached to chamber ?Specifics for Inpatients (complete in addition  to above) ?Medication sheet sent with patient ?NA ?Intravenous medications needed or due during therapy sent with patient ?NA ?Drainage tubes (e.g. nasogastric tube or chest tube secured and vented) ?NA ?Endotracheal or Tracheotomy tube secured ?NA ?Cuff deflated of air and inflated with saline ?NA ?Airway suctioned ?NA ?Notes ?Paper version used prior to treatment. A safety checklist was done before treatment was started. ?Electronic Signature(s) ?Signed: 12/07/2021 10:38:42 AM By: [name] EMT ?Entered By: [name] on 12/07/2021 10:38:42 ?

## 2021-12-07 NOTE — Progress Notes (Addendum)
ASTRYD, PEARCY ([id number]) ?Visit Report for 12/07/2021 ?Arrival Information Details ?Patient Name: Date of Service: ?[name], [name] 12/07/2021 10:00 A M ?Medical Record Number: [id number] ?Patient Account Number: [id number] ?Date of Birth/Sex: Treating RN: ?1956-05-31 (66 y.o. [name], [name] ?Primary Care Roshawn Ayala: [name] ?Other Clinician: [name] ?Referring Lekeisha Arenas: ?Treating Auri Jahnke/Extender: [name] ?[name] ?[name]: 13 ?Visit Information History Since Last Visit ?All ordered tests and consults were completed: Yes ?Patient Arrived: Ambulatory ?Added or deleted any medications: No ?Arrival Time: 09:59 ?Any new allergies or adverse reactions: No ?Accompanied By: None ?Had a fall or experienced change in No ?Transfer Assistance: None ?activities of daily living that may affect ?Patient Identification Verified: Yes ?risk of falls: ?Secondary Verification Process Completed: Yes ?Signs or symptoms of abuse/neglect since last visito No ?Patient Requires Transmission-Based Precautions: No ?Hospitalized since last visit: No ?Patient Has Alerts: Yes ?Implantable device outside of the clinic excluding No ?Patient Alerts: Patient on Blood Thinner cellular tissue based products placed in the center ?since last visit: ?Pain Present Now: No ?Notes ?Paper version used prior to treatment. ?Electronic Signature(s) ?Signed: 12/07/2021 10:35:17 AM By: [name] EMT ?Entered By: [name] on 12/07/2021 10:35:17 ?-------------------------------------------------------------------------------- ?Encounter Discharge Information Details ?Patient Name: Date of Service: ?[name], [name] 12/07/2021 10:00 A M ?Medical Record Number: [id number] ?Patient Account Number: [id number] ?Date of Birth/Sex: Treating RN: ?1956-02-04 (66 y.o. [name], [name] ?Primary Care Hanad Leino: [name] ?Other Clinician: [name] ?Referring Oluwasemilore Pascuzzi: ?Treating Laquenta Whitsell/Extender:  [name] ?[name] ?[name]: 13 ?Encounter Discharge Information Items ?Discharge Condition: Stable ?Ambulatory Status: Ambulatory ?Discharge Destination: Home ?Transportation: Ambulance ?Accompanied By: None ?Schedule Follow-up Appointment: No ?Clinical Summary of Care: ?Electronic Signature(s) ?Signed: 12/07/2021 1:08:30 PM By: [name] EMT ?Entered By: [name] on 12/07/2021 13:08:29 ?-------------------------------------------------------------------------------- ?Vitals Details ?Patient Name: ?Date of Service: ?[name], [name] 12/07/2021 10:00 A M ?Medical Record Number: [id number] ?Patient Account Number: [id number] ?Date of Birth/Sex: ?Treating RN: ?02-Sep-1955 (66 y.o. [name], [name] ?Primary Care Rand Etchison: [name] ?Other Clinician: [name] ?Referring Deniese Oberry: ?Treating Lavel Rieman/Extender: [name] ?[name] ?[name]: 13 ?Vital Signs ?Time Taken: 10:23 ?Temperature (??F): 98.1 ?Height (in): 62 ?Pulse (bpm): 74 ?Weight (lbs): 160 ?Respiratory Rate (breaths/min): 16 ?Body Mass Index (BMI): 29.3 ?Blood Pressure (mmHg): 125/64 ?Capillary Blood Glucose (mg/dl): 151 ?Reference Range: 80 - 120 mg / dl ?Electronic Signature(s) ?Signed: 12/07/2021 10:35:51 AM By: [name] EMT ?Entered By: [name] on 12/07/2021 10:35:51 ?

## 2021-12-08 ENCOUNTER — Encounter (HOSPITAL_BASED_OUTPATIENT_CLINIC_OR_DEPARTMENT_OTHER): Payer: BC Managed Care – PPO | Admitting: Internal Medicine

## 2021-12-08 DIAGNOSIS — D709 Neutropenia, unspecified: Secondary | ICD-10-CM | POA: Diagnosis not present

## 2021-12-08 DIAGNOSIS — C549 Malignant neoplasm of corpus uteri, unspecified: Secondary | ICD-10-CM

## 2021-12-08 DIAGNOSIS — C50212 Malignant neoplasm of upper-inner quadrant of left female breast: Secondary | ICD-10-CM | POA: Diagnosis not present

## 2021-12-08 DIAGNOSIS — Z8543 Personal history of malignant neoplasm of ovary: Secondary | ICD-10-CM

## 2021-12-08 DIAGNOSIS — L59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: Secondary | ICD-10-CM

## 2021-12-08 DIAGNOSIS — E119 Type 2 diabetes mellitus without complications: Secondary | ICD-10-CM | POA: Diagnosis not present

## 2021-12-08 DIAGNOSIS — N950 Postmenopausal bleeding: Secondary | ICD-10-CM

## 2021-12-08 DIAGNOSIS — Z853 Personal history of malignant neoplasm of breast: Secondary | ICD-10-CM | POA: Diagnosis not present

## 2021-12-08 DIAGNOSIS — N3041 Irradiation cystitis with hematuria: Secondary | ICD-10-CM | POA: Diagnosis not present

## 2021-12-08 LAB — GLUCOSE, CAPILLARY
Glucose-Capillary: 154 mg/dL — ABNORMAL HIGH (ref 70–99)
Glucose-Capillary: 150 mg/dL — ABNORMAL HIGH (ref 70–99)

## 2021-12-08 NOTE — Progress Notes (Addendum)
LATERRA, LUBINSKI ([id number]) ?Visit Report for 12/08/2021 ?Encounter Discharge Information Details ?Patient Name: Date of Service: ?[name], [name] 12/08/2021 10:00 A M ?Medical Record Number: [id number] ?Patient Account Number: [id number] ?Date of Birth/Sex: Treating RN: ?[birth year] (66 y.o. F) [name], [name] ?Primary Care Frances Joynt: [name] ?Other Clinician: [name] ?Referring Jnyah Brazee: ?Treating Marvel Mcphillips/Extender: [name] ?[name] ?[name]: 13 ?Encounter Discharge Information Items ?Discharge Condition: Stable ?Ambulatory Status: Ambulatory ?Discharge Destination: Home ?Transportation: Private Auto ?Accompanied By: self ?Schedule Follow-up Appointment: No ?Clinical Summary of Care: ?Electronic Signature(s) ?Signed: 12/08/2021 4:38:43 PM By: [name] CHT EMT BS ?, , ?Entered By: [name] on 12/08/2021 13:38:38 ?-------------------------------------------------------------------------------- ?Vitals Details ?Patient Name: Date of Service: ?[name], [name] 12/08/2021 10:00 A M ?Medical Record Number: [id number] ?Patient Account Number: [id number] ?Date of Birth/Sex: Treating RN: ?03-Oct-1955 (66 y.o. F) [name], [name] ?Primary Care Petrita Blunck: [name] ?Other Clinician: [name] ?Referring Paiden Cavell: ?Treating Ellisha Bankson/Extender: [name] ?[name] ?[name]: 13 ?Vital Signs ?Time Taken: 10:05 ?Temperature (??F): 98.0 ?Height (in): 62 ?Pulse (bpm): 90 ?Weight (lbs): 160 ?Respiratory Rate (breaths/min): 18 ?Body Mass Index (BMI): 29.3 ?Blood Pressure (mmHg): 110/61 ?Capillary Blood Glucose (mg/dl): 154 ?Reference Range: 80 - 120 mg / dl ?Electronic Signature(s) ?Signed: 12/08/2021 10:44:55 AM By: [name] CHT EMT BS ?, , ?Entered By: [name] on 12/08/2021 10:44:27 ?

## 2021-12-08 NOTE — Progress Notes (Signed)
ENVI, EAGLESON ([id number]) ?Visit Report for 12/07/2021 ?Problem List Details ?Patient Name: Date of Service: ?[name], [name] 12/07/2021 10:00 A M ?Medical Record Number: [id number] ?Patient Account Number: [id number] ?Date of Birth/Sex: Treating RN: ?June 05, 1956 (66 y.o. 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, unspecified 09/03/2021 No Yes ?N95.0 Postmenopausal bleeding 09/03/2021 No Yes ?D70.9 Neutropenia, unspecified 09/03/2021 No Yes ?E11.9 Type 2 diabetes mellitus without complications 0/0/9381 No Yes ?L59.8 Other specified disorders of the skin and subcutaneous tissue related to 11/02/2021 No Yes ?radiation ?Inactive Problems ?Resolved Problems ?Electronic Signature(s) ?Signed: 12/07/2021 1:07:53 PM By: [name] EMT ?Signed: 12/08/2021 3:21:24 PM By: [name] DO ?Entered By: [name] on 12/07/2021 13:07:52 ?-------------------------------------------------------------------------------- ?SuperBill Details ?Patient Name: ?Date of Service: ?[name], [name] 12/07/2021 ?Medical Record Number: [id number] ?Patient Account Number: [id number] ?Date of Birth/Sex: ?Treating RN: ?03/08/1956 (66 y.o. 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 ?Facility Procedures ?CPT4 Code: 67893810 ?Description: G0277-(Facility Use Only) HBOT full body chamber, 56mn , ICD-10 Diagnosis Description 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 C54.9  ?Malignant neoplasm of corpus uteri, unspecified N95.0 Postmenopausal bleeding ?Modifier: n ?Quantity: 4 ?Physician Procedures ?: CPT4 Code Description Modifier [id number] 85277- WC PHYS HYPERBARIC OXYGEN THERAPY ICD-10 Diagnosis Description 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

## 2021-12-09 ENCOUNTER — Encounter (HOSPITAL_BASED_OUTPATIENT_CLINIC_OR_DEPARTMENT_OTHER): Payer: BC Managed Care – PPO | Admitting: General Surgery

## 2021-12-09 DIAGNOSIS — Z853 Personal history of malignant neoplasm of breast: Secondary | ICD-10-CM | POA: Diagnosis not present

## 2021-12-09 DIAGNOSIS — D709 Neutropenia, unspecified: Secondary | ICD-10-CM | POA: Diagnosis not present

## 2021-12-09 DIAGNOSIS — Z8543 Personal history of malignant neoplasm of ovary: Secondary | ICD-10-CM | POA: Diagnosis not present

## 2021-12-09 DIAGNOSIS — L59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: Secondary | ICD-10-CM | POA: Diagnosis not present

## 2021-12-09 DIAGNOSIS — N950 Postmenopausal bleeding: Secondary | ICD-10-CM | POA: Diagnosis not present

## 2021-12-09 DIAGNOSIS — C549 Malignant neoplasm of corpus uteri, unspecified: Secondary | ICD-10-CM | POA: Diagnosis not present

## 2021-12-09 DIAGNOSIS — C50212 Malignant neoplasm of upper-inner quadrant of left female breast: Secondary | ICD-10-CM | POA: Diagnosis not present

## 2021-12-09 DIAGNOSIS — E119 Type 2 diabetes mellitus without complications: Secondary | ICD-10-CM | POA: Diagnosis not present

## 2021-12-09 DIAGNOSIS — N3041 Irradiation cystitis with hematuria: Secondary | ICD-10-CM | POA: Diagnosis not present

## 2021-12-09 LAB — GLUCOSE, CAPILLARY
Glucose-Capillary: 161 mg/dL — ABNORMAL HIGH (ref 70–99)
Glucose-Capillary: 148 mg/dL — ABNORMAL HIGH (ref 70–99)

## 2021-12-09 NOTE — Progress Notes (Addendum)
TRINADY, MILEWSKI ([id number]) ?Visit Report for 12/09/2021 ?Arrival Information Details ?Patient Name: Date of Service: ?[name], [name] 12/09/2021 10:00 A M ?Medical Record Number: [id number] ?Patient Account Number: [id number] ?Date of Birth/Sex: Treating RN: ?[birth year] (66 y.o. [name], [name] ?Primary Care Tiffany Calmes: [name] ?Other Clinician: [name] ?Referring Demetrus Pavao: ?Treating Oneil Behney/Extender: [name] ?[name] ?[name]: 13 ?Visit Information History Since Last Visit ?All ordered tests and consults were completed: Yes ?Patient Arrived: Ambulatory ?Added or deleted any medications: No ?Arrival Time: 09:52 ?Any new allergies or adverse reactions: No ?Accompanied By: self ?Had a fall or experienced change in No ?Transfer Assistance: None ?activities of daily living that may affect ?Patient Identification Verified: Yes ?risk of falls: ?Secondary Verification Process Completed: Yes ?Signs or symptoms of abuse/neglect since last visito No ?Patient Requires Transmission-Based Precautions: No ?Pain Present Now: No ?Patient Has Alerts: Yes ?Patient Alerts: Patient on Blood Thinner ?Electronic Signature(s) ?Signed: 12/09/2021 1:17:07 PM By: [name] CHT EMT BS ?, , ?Entered By: [name] on 12/09/2021 13:17:07 ?-------------------------------------------------------------------------------- ?Encounter Discharge Information Details ?Patient Name: Date of Service: ?[name], [name] 12/09/2021 10:00 A M ?Medical Record Number: [id number] ?Patient Account Number: [id number] ?Date of Birth/Sex: Treating RN: ?October 21, 1955 (67 y.o. [name], [name] ?Primary Care Shirelle Tootle: [name] ?Other Clinician: [name] ?Referring Paiden Caraveo: ?Treating Erin Obando/Extender: [name] ?[name] ?[name]: 13 ?Encounter Discharge Information Items ?Discharge Condition: Stable ?Ambulatory Status: Ambulatory ?Discharge Destination:  Home ?Transportation: Private Auto ?Accompanied By: self ?Schedule Follow-up Appointment: No ?Clinical Summary of Care: ?Electronic Signature(s) ?Signed: 12/09/2021 1:22:07 PM By: [name] CHT EMT BS ?, , ?Entered By: [name] on 12/09/2021 13:22:07 ?-------------------------------------------------------------------------------- ?Vitals Details ?Patient Name: ?Date of Service: ?[name], [name] 12/09/2021 10:00 A M ?Medical Record Number: [id number] ?Patient Account Number: [id number] ?Date of Birth/Sex: ?Treating RN: ?28-Jul-1956 (66 y.o. [name], [name] ?Primary Care Burnette Sautter: [name] ?Other Clinician: [name] ?Referring Catelyn Friel: ?Treating Jerrico Covello/Extender: [name] ?[name] ?[name]: 13 ?Vital Signs ?Time Taken: 09:57 ?Temperature (??F): 98.2 ?Height (in): 62 ?Pulse (bpm): 63 ?Weight (lbs): 160 ?Respiratory Rate (breaths/min): 18 ?Body Mass Index (BMI): 29.3 ?Blood Pressure (mmHg): 126/57 ?Capillary Blood Glucose (mg/dl): 161 ?Reference Range: 80 - 120 mg / dl ?Electronic Signature(s) ?Signed: 12/09/2021 1:17:32 PM By: [name] CHT EMT BS ?, , ?Entered By: [name] on 12/09/2021 13:17:32 ?

## 2021-12-09 NOTE — Progress Notes (Signed)
LEANOR, VORIS ([id number]) ?Visit Report for 12/08/2021 ?SuperBill Details ?Patient Name: Date of Service: ?[name], [name] 12/08/2021 ?Medical Record Number: [id number] ?Patient Account Number: [id number] ?Date of Birth/Sex: Treating RN: ?[birth year] (66 y.o. 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 ?Facility Procedures ?CPT4 Code Description Modifier Quantity ?62263335 G0277-(Facility Use Only) HBOT full body chamber, 70mn ?, 4 ?ICD-10 Diagnosis Description ?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 ?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 ?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, unspecified ?N95.0 Postmenopausal bleeding ?Physician Procedures ?Quantity ?CPT4 Code Description Modifier ?[id number] WC PHYS HYPERBARIC OXYGEN THERAPY 1 ?ICD-10 Diagnosis Description ?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

## 2021-12-09 NOTE — Progress Notes (Signed)
OUIDA, ABEYTA ([id number]) ?Visit Report for 12/08/2021 ?[name] Details ?Patient Name: Date of Service: ?[name], [name] 12/08/2021 10:00 A M ?Medical Record Number: [id number] ?Patient Account Number: [id number] ?Date of Birth/Sex: Treating RN: ?1956/01/19 (66 y.o. 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Treatment Segments: 4 ?Vital Signs ?Capillary Blood Glucose Reference Range: 80 - 120 mg / dl ?[name] Diabetic Blood Glucose Intervention Range: <131 mg/dl or >249 mg/dl ?Time Vitals Blood Respiratory Capillary Blood Glucose Pulse Action ?Type: ?Pulse: Temperature: ?Taken: ?Pressure: ?Rate: ?Glucose (mg/dl): ?Meter #: Oximetry (%) Taken: ?Pre 10:05 110/61 90 18 98 154 2 ?Post 12:38 103/63 70 18 98.5 150 2 ?Treatment Response ?Treatment Toleration: Well ?Treatment Completion Status: Treatment Completed without Adverse Event ?Physician [name] Attestation: ?I certify that I supervised this [name] treatment in accordance with Medicare ?guidelines. A trained  emergency response team is readily available per Yes ?hospital policies and procedures. ?Continue HBOT as ordered. Yes ?Electronic Signature(s) ?Signed: 12/09/2021 12:44:21 PM By: [name] DO ?Entered By: [name] on 12/08/2021 15:18:07 ?-------------------------------------------------------------------------------- ?[name] Safety Checklist Details ?Patient Name: ?Date of Service: ?[name] [name], [name] [name] 12/08/2021 10:00 A M ?Medical Record Number: [id number] ?Patient Account Number: [id number] ?Date of Birth/Sex: ?Treating RN: ?[birth year] (66 y.o. F) [name], [name] ?Primary Care [name]: [name] ?Other Clinician: [name] ?Referring Henlee Donovan: ?Treating Rizwan Kuyper/Extender: [name] ?[name] ?[name]: 13 ?[name] Safety Checklist Items ?Safety Checklist ?Consent Form Signed ?Patient voided / foley secured and emptied ?When did you last eato 0800 ?Last dose of injectable or oral agent 12/05/2021 ?Ostomy pouch emptied and vented if applicable Urostomy attached to collection bag ?All implantable devices assessed, documented and approved Silicone Implants ?Intravenous access site secured and place ?Valuables secured ?Linens and cotton and cotton/polyester blend (less than 51% polyester) ?Personal oil-based products / skin lotions / body lotions removed ?Wigs or hairpieces removed ?NA ?Smoking or tobacco materials removed ?NA ?Books / newspapers / magazines / loose paper removed ?Cologne, aftershave, perfume and deodorant removed ?Jewelry removed (may wrap wedding band) ?Make-up removed ?Hair care products removed ?Battery operated devices (external) removed ?Heating patches and chemical warmers removed ?Titanium eyewear removed ?NA Plastic frames ?Nail polish cured greater than 10 hours ?NA ?Casting material cured greater than 10 hours ?NA ?Hearing aids removed ?NA ?Loose dentures or partials removed ?NA ?Prosthetics have been removed ?NA ?Patient demonstrates correct use  of air break device (if applicable) ?Patient concerns have been addressed ?Patient grounding bracelet on and cord attached to chamber ?Specifics for Inpatients (complete in addition to above) ?Medication sheet sent with patient ?NA ?Intravenous medications needed or due during therapy sent with patient ?NA ?Drainage tubes (e.g. nasogastric tube or  chest tube secured and vented) ?NA ?Endotracheal or Tracheotomy tube secured ?NA ?Cuff deflated of air and inflated with saline ?NA ?Airway suctioned ?NA ?Notes ?Paper version used prior to treatment. ?Electronic Signature(s) ?Signed: 12/08/2021 4:38:43 PM By: [name] CHT EMT BS ?, , ?Entered By: [name] on 12/08/2021 10:47:19 ?

## 2021-12-09 NOTE — Progress Notes (Signed)
ARMIYAH, [name] ([id number]) ?Visit Report for 12/09/2021 ?SuperBill Details ?Patient Name: Date of Service: ?[name], [name] 12/09/2021 ?Medical Record Number: [id number] ?Patient Account Number: [id number] ?Date of Birth/Sex: Treating RN: ?January 19, 1956 (66 y.o. 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 ?Facility Procedures ?CPT4 Code Description Modifier Quantity ?[id number] G0277-(Facility Use Only) HBOT full body chamber, 29mn ?, 4 ?ICD-10 Diagnosis Description ?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 ?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 ?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, unspecified ?N95.0 Postmenopausal bleeding ?Physician Procedures ?Quantity ?CPT4 Code Description Modifier ?[id number] WC PHYS HYPERBARIC OXYGEN THERAPY 1 ?ICD-10 Diagnosis Description ?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

## 2021-12-09 NOTE — Progress Notes (Addendum)
[name], [name] ([id number]) ?Visit Report for 12/09/2021 ?[name] Details ?Patient Name: Date of Service: ?[name], [name] 12/09/2021 10:00 A M ?Medical Record Number: [id number] ?Patient Account Number: [id number] ?Date of Birth/Sex: Treating RN: ?1955/08/07 (66 y.o. 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Treatment Segments: 4 ?Vital Signs ?Capillary Blood Glucose Reference Range: 80 - 120 mg / dl ?[name] Diabetic Blood Glucose Intervention Range: <131 mg/dl or >249 mg/dl ?Type: Time Vitals Blood Respiratory Capillary Blood Glucose Pulse Action ?Pulse: Temperature: ?Taken: ?Pressure: ?Rate: ?Glucose (mg/dl): ?Meter #: Oximetry (%) Taken: ?Pre 09:57 126/57 63 18 98.2 161 2 none per protocol ?Post 12:35 132/58 64 18 98.1 148 2 none per protocol ?Treatment Response ?Treatment Toleration: Well ?Treatment Completion Status: Treatment Completed without Adverse Event ?Physician [name] Attestation: ?I certify that I supervised this [name] treatment in  accordance with Medicare ?guidelines. A trained emergency response team is readily available per Yes ?hospital policies and procedures. ?Continue HBOT as ordered. Yes ?Electronic Signature(s) ?Signed: 12/09/2021 2:24:00 PM By: [name] Maudlin MD FACS ?Previous Signature: 12/09/2021 1:20:51 PM Version By: [name] CHT EMT BS ?, , ?Entered By: [name] on 12/09/2021 14:24:00 ?-------------------------------------------------------------------------------- ?[name] Safety Checklist Details ?Patient Name: ?Date of Service: ?[name], [name] 12/09/2021 10:00 A M ?Medical Record Number: [id number] ?Patient Account Number: [id number] ?Date of Birth/Sex: ?Treating RN: ?1956/01/28 (66 y.o. [name], [name] ?Primary Care [name]: [name] ?Other Clinician: [name] ?Referring [name]: ?Treating [name]: [name] ?[name] ?[name]: 13 ?[name] Safety Checklist Items ?Safety Checklist ?Consent Form Signed ?Patient voided / foley secured and emptied ?When did you last eato 0745 ?Last dose of injectable or oral agent 12/05/2021 ?Ostomy pouch emptied and vented if applicable Urostomy connected to collection bag ?All implantable devices assessed, documented and approved Silicone implants ?Intravenous access site secured and place ?NA ?Valuables secured ?Linens and cotton and cotton/polyester blend (less than 51% polyester) ?Personal oil-based products / skin lotions / body lotions removed ?Wigs or hairpieces removed ?NA ?Smoking or tobacco materials removed ?NA ?Books / newspapers / magazines / loose paper removed ?Cologne, aftershave, perfume and deodorant removed ?Jewelry removed (may wrap wedding band) ?Make-up removed ?Hair care products removed ?Battery operated devices (external) removed ?Heating patches and chemical warmers removed ?Titanium eyewear removed ?NA Plastic frames ?Nail polish cured greater than 10 hours ?NA ?Casting material cured greater than 10  hours ?NA ?Hearing aids removed ?NA ?Loose dentures or partials removed ?NA ?Prosthetics have been removed ?NA ?Patient demonstrates correct use of air break device (if applicable) ?Patient concerns have been addressed ?Patient grounding bracelet on and cord attached to chamber ?Specifics for Inpatients (complete in addition to above) ?Medication  sheet sent with patient ?NA ?Intravenous medications needed or due during therapy sent with patient ?NA ?Drainage tubes (e.g. nasogastric tube or chest tube secured and vented) ?NA ?Endotracheal or Tracheotomy tube secured ?NA ?Cuff deflated of air and inflated with saline ?NA ?Airway suctioned ?NA ?Notes ?Paper version used prior to treatment. ?Electronic Signature(s) ?Signed: 12/09/2021 1:18:58 PM By: [name] CHT EMT BS ?, , ?Entered By: [name] on 12/09/2021 13:18:58 ?

## 2021-12-10 ENCOUNTER — Encounter (INDEPENDENT_AMBULATORY_CARE_PROVIDER_SITE_OTHER): Payer: Self-pay | Admitting: Family Medicine

## 2021-12-10 ENCOUNTER — Ambulatory Visit (INDEPENDENT_AMBULATORY_CARE_PROVIDER_SITE_OTHER): Payer: BC Managed Care – PPO | Admitting: Family Medicine

## 2021-12-10 ENCOUNTER — Encounter (HOSPITAL_BASED_OUTPATIENT_CLINIC_OR_DEPARTMENT_OTHER): Payer: BC Managed Care – PPO | Admitting: General Surgery

## 2021-12-10 VITALS — BP 144/69 | HR 59 | Temp 97.9°F | Ht 62.0 in | Wt 216.0 lb

## 2021-12-10 DIAGNOSIS — N939 Abnormal uterine and vaginal bleeding, unspecified: Secondary | ICD-10-CM

## 2021-12-10 DIAGNOSIS — Z7985 Long-term (current) use of injectable non-insulin antidiabetic drugs: Secondary | ICD-10-CM

## 2021-12-10 DIAGNOSIS — D709 Neutropenia, unspecified: Secondary | ICD-10-CM | POA: Diagnosis not present

## 2021-12-10 DIAGNOSIS — C549 Malignant neoplasm of corpus uteri, unspecified: Secondary | ICD-10-CM | POA: Diagnosis not present

## 2021-12-10 DIAGNOSIS — L59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: Secondary | ICD-10-CM | POA: Diagnosis not present

## 2021-12-10 DIAGNOSIS — N184 Chronic kidney disease, stage 4 (severe): Secondary | ICD-10-CM | POA: Diagnosis not present

## 2021-12-10 DIAGNOSIS — C50212 Malignant neoplasm of upper-inner quadrant of left female breast: Secondary | ICD-10-CM | POA: Diagnosis not present

## 2021-12-10 DIAGNOSIS — Z8543 Personal history of malignant neoplasm of ovary: Secondary | ICD-10-CM | POA: Diagnosis not present

## 2021-12-10 DIAGNOSIS — Z6839 Body mass index (BMI) 39.0-39.9, adult: Secondary | ICD-10-CM

## 2021-12-10 DIAGNOSIS — Z853 Personal history of malignant neoplasm of breast: Secondary | ICD-10-CM | POA: Diagnosis not present

## 2021-12-10 DIAGNOSIS — E1122 Type 2 diabetes mellitus with diabetic chronic kidney disease: Secondary | ICD-10-CM | POA: Insufficient documentation

## 2021-12-10 DIAGNOSIS — N3041 Irradiation cystitis with hematuria: Secondary | ICD-10-CM | POA: Diagnosis not present

## 2021-12-10 DIAGNOSIS — E669 Obesity, unspecified: Secondary | ICD-10-CM | POA: Diagnosis not present

## 2021-12-10 DIAGNOSIS — E119 Type 2 diabetes mellitus without complications: Secondary | ICD-10-CM | POA: Diagnosis not present

## 2021-12-10 DIAGNOSIS — N950 Postmenopausal bleeding: Secondary | ICD-10-CM | POA: Diagnosis not present

## 2021-12-10 LAB — GLUCOSE, CAPILLARY
Glucose-Capillary: 130 mg/dL — ABNORMAL HIGH (ref 70–99)
Glucose-Capillary: 165 mg/dL — ABNORMAL HIGH (ref 70–99)

## 2021-12-10 MED ORDER — TIRZEPATIDE 7.5 MG/0.5ML ~~LOC~~ SOAJ: 7.5000 mg | SUBCUTANEOUS | 0 refills | Status: DC

## 2021-12-10 NOTE — Progress Notes (Signed)
CAROLYNN, TULEY ([id number]) ?Visit Report for 12/10/2021 ?SuperBill Details ?Patient Name: Date of Service: ?[name], [name] 12/10/2021 ?Medical Record Number: [id number] ?Patient Account Number: [id number] ?Date of Birth/Sex: Treating RN: ?01/12/1956 (66 y.o. 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 ?Facility Procedures ?CPT4 Code Description Modifier Quantity ?[id number] G0277-(Facility Use Only) HBOT full body chamber, 16mn ?, 2 ?ICD-10 Diagnosis Description ?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 ?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 ?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, unspecified ?N95.0 Postmenopausal bleeding ?Physician Procedures ?Quantity ?CPT4 Code Description Modifier ?[id number] WC PHYS HYPERBARIC OXYGEN THERAPY 1 ?ICD-10 Diagnosis Description ?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

## 2021-12-11 ENCOUNTER — Encounter (HOSPITAL_BASED_OUTPATIENT_CLINIC_OR_DEPARTMENT_OTHER): Payer: BC Managed Care – PPO | Admitting: Internal Medicine

## 2021-12-11 DIAGNOSIS — C549 Malignant neoplasm of corpus uteri, unspecified: Secondary | ICD-10-CM | POA: Diagnosis not present

## 2021-12-11 DIAGNOSIS — N3041 Irradiation cystitis with hematuria: Secondary | ICD-10-CM | POA: Diagnosis not present

## 2021-12-11 DIAGNOSIS — N950 Postmenopausal bleeding: Secondary | ICD-10-CM

## 2021-12-11 DIAGNOSIS — D709 Neutropenia, unspecified: Secondary | ICD-10-CM | POA: Diagnosis not present

## 2021-12-11 DIAGNOSIS — Z8543 Personal history of malignant neoplasm of ovary: Secondary | ICD-10-CM | POA: Diagnosis not present

## 2021-12-11 DIAGNOSIS — L59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: Secondary | ICD-10-CM

## 2021-12-11 DIAGNOSIS — C50212 Malignant neoplasm of upper-inner quadrant of left female breast: Secondary | ICD-10-CM | POA: Diagnosis not present

## 2021-12-11 DIAGNOSIS — E119 Type 2 diabetes mellitus without complications: Secondary | ICD-10-CM | POA: Diagnosis not present

## 2021-12-11 DIAGNOSIS — Z853 Personal history of malignant neoplasm of breast: Secondary | ICD-10-CM | POA: Diagnosis not present

## 2021-12-11 LAB — GLUCOSE, CAPILLARY
Glucose-Capillary: 156 mg/dL — ABNORMAL HIGH (ref 70–99)
Glucose-Capillary: 145 mg/dL — ABNORMAL HIGH (ref 70–99)

## 2021-12-11 NOTE — Progress Notes (Addendum)
[name], [name] ([id number]) ?Visit Report for 12/11/2021 ?[name] Details ?Patient Name: Date of Service: ?[name], [name] 12/11/2021 10:00 A M ?Medical Record Number: [id number] ?Patient Account Number: [id number] ?Date of Birth/Sex: Treating RN: ?1956/06/19 (66 y.o. 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 [name] Treatment 12/11/2021 ?End Date: ?[name] Discharge Treatment Series Complete; Non-Wound Protocol ?Outcome: Completed with Symptom Relief ?[name] Treatment Details ?Treatment Number: 40 ?Patient Type: Outpatient ?Chamber Type: Monoplace ?Chamber Serial #: [id number] ?Treatment Protocol: 2.0 ATA with 90 minutes oxygen, with two 5 minute air breaks ?Treatment Details ?Compression Rate Down: 1.0 psi / minute ?De-Compression Rate Up: 1.0 psi / minute ?A breaks and breathing ?ir ?Compress Tx Pressure periods Decompress Decompress ?Begins Reached (leave unused spaces Begins Ends ?blank) ?Chamber Pressure (ATA '1 2 2 2 2 2 '$ --2 1 ?) ?Clock Time (24 hr) 10:22 10:39 11:09 11:14 11:44 11:49 - - 12:19 12:32 ?Treatment Length: 130 (minutes) ?Treatment Segments: 4 ?Vital Signs ?Capillary Blood Glucose Reference Range: 80 - 120 mg / dl ?[name] Diabetic Blood Glucose Intervention Range: <131 mg/dl or >249 mg/dl ?Time Vitals Blood Respiratory Capillary Blood Glucose Pulse Action ?Type: ?Pulse: Temperature: ?Taken: ?Pressure: ?Rate: ?Glucose (mg/dl): ?Meter #: Oximetry (%) Taken: ?Pre 10:15 126/62 63 16 97.5 156 ?Post 12:35 124/62 63 18 97.7 145 ?Treatment Response ?Treatment Toleration: Well ?Treatment Completion Status: Treatment Completed without Adverse  Event ?Physician [name] Attestation: ?I certify that I supervised this [name] treatment in accordance with Medicare ?guidelines. A trained emergency response team is readily available per Yes ?hospital policies and procedures. ?Continue HBOT as ordered. Yes ?Electronic Signature(s) ?Signed: 12/14/2021 4:28:57 PM By: [name] DO ?Previous Signature: 12/11/2021 3:01:19 PM Version By: [name] MD FACS ?Previous Signature: 12/11/2021 1:21:16 PM Version By: [name] EMT ?Entered By: [name] on 12/14/2021 16:28:09 ?-------------------------------------------------------------------------------- ?[name] Safety Checklist Details ?Patient Name: ?Date of Service: ?[name], [name] 12/11/2021 10:00 A M ?Medical Record Number: [id number] ?Patient Account Number: [id number] ?Date of Birth/Sex: ?Treating RN: ?[birth year] (66 y.o. F) [name], [name] ?Primary Care Ertha Nabor: [name] ?Other Clinician: [name] ?Referring Maisey Deandrade: ?Treating Neva Ramaswamy/Extender: [name] ?[name] ?[name]: 14 ?[name] Safety Checklist Items ?Safety Checklist ?Consent Form Signed ?Patient voided / foley secured and emptied ?When did you last eato 0800 ?Last dose of injectable or oral agent Saturday 12/05/2021 ?Ostomy pouch emptied and vented if applicable Urostomy attached to collection bag ?All implantable devices assessed, documented and approved Silicone implants ?Intravenous access site secured and place ?NA ?Valuables secured ?Linens and cotton and cotton/polyester blend (less than 51% polyester) ?Personal oil-based products / skin lotions / body lotions removed ?Wigs or hairpieces removed ?NA ?Smoking or tobacco materials removed ?Books / newspapers / magazines / loose paper removed ?Cologne, aftershave, perfume and deodorant removed ?Jewelry removed (may wrap wedding band) ?NA ?Make-up removed ?Hair care products removed ?Battery operated devices (external) removed ?Heating patches and  chemical warmers removed ?Titanium eyewear removed ?NA Plastic frames ?Nail polish cured greater than 10 hours ?NA ?Casting material cured greater than 10 hours ?NA ?Hearing aids removed ?NA ?Loose dentures or partials removed ?NA ?Prosthetics have been removed ?NA ?Patient demonstrates correct use of air break device (if applicable) ?Patient concerns have been addressed ?Patient grounding  bracelet on and cord attached to chamber ?Specifics for Inpatients (complete in addition to above) ?Medication sheet sent with patient ?NA ?Intravenous medications needed or due during therapy sent with patient ?NA ?Drainage tubes (e.g. nasogastric tube or chest tube secured and vented) ?NA ?Endotracheal or Tracheotomy tube secured ?NA ?Cuff deflated of air and inflated with saline ?NA ?Airway suctioned ?NA ?Notes ?Paper version used prior to treatment. Safety checklist was done before treatment was started. ?Electronic Signature(s) ?Signed: 12/11/2021 1:17:49 PM By: [name] EMT ?Entered By: [name] on 12/11/2021 13:17:49 ?

## 2021-12-11 NOTE — Progress Notes (Addendum)
ZHANNA, MELIN ([id number]) ?Visit Report for 12/11/2021 ?Arrival Information Details ?Patient Name: Date of Service: ?[name], [name] 12/11/2021 10:00 A M ?Medical Record Number: [id number] ?Patient Account Number: [id number] ?Date of Birth/Sex: Treating RN: ?25-Nov-1955 (66 y.o. F) [name], [name] ?Primary Care Tinna Kolker: [name] ?Other Clinician: [name] ?Referring Madeline Pho: ?Treating Felicity Penix/Extender: [name] ?[name] ?[name]: 14 ?Visit Information History Since Last Visit ?All ordered tests and consults were completed: Yes ?Patient Arrived: Ambulatory ?Added or deleted any medications: No ?Arrival Time: 09:56 ?Any new allergies or adverse reactions: No ?Accompanied By: None ?Had a fall or experienced change in No ?Transfer Assistance: None ?activities of daily living that may affect ?Patient Identification Verified: Yes ?risk of falls: ?Secondary Verification Process Completed: Yes ?Signs or symptoms of abuse/neglect since last visito No ?Patient Requires Transmission-Based Precautions: No ?Hospitalized since last visit: No ?Patient Has Alerts: Yes ?Implantable device outside of the clinic excluding No ?Patient Alerts: Patient on Blood Thinner cellular tissue based products placed in the center ?since last visit: ?Pain Present Now: No ?Electronic Signature(s) ?Signed: 12/11/2021 12:22:04 PM By: [name] EMT ?Entered By: [name] on 12/11/2021 12:22:04 ?-------------------------------------------------------------------------------- ?Encounter Discharge Information Details ?Patient Name: Date of Service: ?[name], [name] 12/11/2021 10:00 A M ?Medical Record Number: [id number] ?Patient Account Number: [id number] ?Date of Birth/Sex: Treating RN: ?05/22/1956 (66 y.o. F) [name], [name] ?Primary Care Parneet Glantz: [name] ?Other Clinician: [name] ?Referring Ernestina Joe: ?Treating Juliene Kirsh/Extender: [name] ?[name] ?Weeks in  Treatment: 14 ?Encounter Discharge Information Items ?Discharge Condition: Stable ?Ambulatory Status: Ambulatory ?Discharge Destination: Home ?Transportation: Private Auto ?Accompanied By: None ?Schedule Follow-up Appointment: No ?Clinical Summary of Care: ?Notes ?Patient last treatment. ?Electronic Signature(s) ?Signed: 12/11/2021 1:23:32 PM By: [name] EMT ?Entered By: [name] on 12/11/2021 13:23:32 ?-------------------------------------------------------------------------------- ?Vitals Details ?Patient Name: ?Date of Service: ?[name], [name] 12/11/2021 10:00 A M ?Medical Record Number: [id number] ?Patient Account Number: [id number] ?Date of Birth/Sex: ?Treating RN: ?10-03-1955 (66 y.o. F) [name], [name] ?Primary Care Leialoha Hanna: [name] ?Other Clinician: [name] ?Referring Broly Hatfield: ?Treating Yates Weisgerber/Extender: [name] ?[name] ?[name]: 14 ?Vital Signs ?Time Taken: 10:15 ?Temperature (??F): 97.5 ?Height (in): 62 ?Pulse (bpm): 63 ?Weight (lbs): 160 ?Respiratory Rate (breaths/min): 16 ?Body Mass Index (BMI): 29.3 ?Blood Pressure (mmHg): 126/62 ?Capillary Blood Glucose (mg/dl): 156 ?Reference Range: 80 - 120 mg / dl ?Electronic Signature(s) ?Signed: 12/11/2021 1:13:52 PM By: [name] EMT ?Entered By: [name] on 12/11/2021 13:13:52 ?

## 2021-12-14 NOTE — Progress Notes (Signed)
[name], [name] ([id number]) ?Visit Report for 12/11/2021 ?Problem List Details ?Patient Name: Date of Service: ?[name], [name] 12/11/2021 10:00 A M ?Medical Record Number: [id number] ?Patient Account Number: [id number] ?Date of Birth/Sex: Treating RN: ?[birth year] (66 y.o. 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, unspecified 09/03/2021 No Yes ?N95.0 Postmenopausal bleeding 09/03/2021 No Yes ?D70.9 Neutropenia, unspecified 09/03/2021 No Yes ?E11.9 Type 2 diabetes mellitus without complications 11/03/100 No Yes ?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 12/11/2021 ?Medical Record Number: [id number] ?Patient Account Number: [id number] ?Date of Birth/Sex: ?Treating RN: ?1956/02/07 (66 y.o. 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 ?Facility Procedures ?CPT4 Code: 34742595 ?Description: G0277-(Facility Use Only) HBOT full body chamber, 87mn , ICD-10 Diagnosis Description 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 C54.9  ?Malignant neoplasm of corpus uteri, unspecified N95.0 Postmenopausal bleeding ?Modifier: n ?Quantity: 4 ?Physician Procedures ?: CPT4 Code Description Modifier [id number] PHYS HYPERBARIC OXYGEN THERAPY ICD-10 Diagnosis Description 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 Z85.43 Personal history of malignant neoplasm of  ?ovary 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

## 2021-12-18 NOTE — Progress Notes (Signed)
[name], [name] ([id number]) Visit Report for 12/10/2021 Arrival Information Details Patient Name: Date of Service: [name], [name] 12/10/2021 1:00 PM Medical Record Number: [id number] Patient Account Number: [id number] Date of Birth/Sex: Treating RN: 12-06-1955 (66 y.o. [name] Primary Care [name]: [name] Other Clinician: Donavan [name] Referring [name]: Treating [name]/Extender: [name] in Treatment: 81 Visit Information History Since Last Visit All ordered tests and consults were completed: Yes Patient Arrived: Ambulatory Added or deleted any medications: No Arrival Time: 12:56 Any new allergies or adverse reactions: No Accompanied By: self Had a fall or experienced change in No Transfer Assistance: None activities of daily living that may affect Patient Identification Verified: Yes risk of falls: Secondary Verification Process Completed: Yes Signs or symptoms of abuse/neglect since last visito No Patient Requires Transmission-Based Precautions: No Hospitalized since last visit: No Patient Has Alerts: Yes Implantable device outside of the clinic excluding No Patient Alerts: Patient on Blood Thinner cellular tissue based products placed in the center since last visit: Pain Present Now: No Electronic Signature(s) Signed: 12/18/2021 9:27:07 AM By: [name] , , Entered By: [name] on 12/10/2021 13:41:00 -------------------------------------------------------------------------------- Encounter Discharge Information Details Patient Name: Date of Service: HO [name], [name] [name] 12/10/2021 1:00 PM Medical Record Number: [id number] Patient Account Number: [id number] Date of Birth/Sex: Treating RN: Mar 22, 1956 (66 y.o. [name] Primary Care Shauntay Brunelli: [name] Other Clinician: Donavan [name] Referring Neftali Thurow: Treating Avey Mcmanamon/Extender: [name] in Treatment: 14 Encounter Discharge Information Items Discharge Condition: Stable Ambulatory Status: Ambulatory Discharge Destination: Home Transportation: Private Auto Accompanied By: self Schedule Follow-up Appointment: No Clinical Summary of Care: Electronic Signature(s) Signed: 12/10/2021 4:04:03 PM By: [name] , , Entered By: [name] on 12/10/2021 16:04:03 -------------------------------------------------------------------------------- Vitals Details Patient Name: Date of Service: HO [name], [name] [name] 12/10/2021 1:00 PM Medical Record Number: [id number] Patient Account Number: [id number] Date of Birth/Sex: Treating RN: Jul 18, 1956 (67 y.o. [name] Primary Care [name]: [name] Other Clinician: Donavan [name] Referring [name]: Treating Lateya Dauria/Extender: [name] in Treatment: 14 Vital Signs Time Taken: 12:56 Temperature (F): 98.6 Height (in): 62 Pulse (bpm): 65 Weight (lbs): 160 Respiratory Rate (breaths/min): 16 Body Mass Index (BMI): 29.3 Blood Pressure (mmHg): 125/54 Capillary Blood Glucose (mg/dl): 130 Reference Range: 80 - 120 mg / dl Electronic Signature(s) Signed: 12/18/2021 9:27:07 AM By: [name] , , Entered By: [name] on 12/10/2021 13:43:59

## 2021-12-18 NOTE — Progress Notes (Signed)
ERSA, DELANEY ([id number]) Visit Report for 12/10/2021 [name] Details Patient Name: Date of Service: [name], [name] 12/10/2021 1:00 PM Medical Record Number: [id number] Patient Account Number: [id number] Date of Birth/Sex: Treating RN: 1955/08/14 (66 y.o. 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 [name] Treatment Details Treatment Number: 39 Patient Type: Outpatient Chamber Type: Monoplace Chamber Serial #: [id number] Treatment Protocol: 2.0 ATA with 90 minutes oxygen, with two 5 minute air breaks Treatment Details Compression Rate Down: 1.0 psi / minute De-Compression Rate Up: 2.0 psi / minute A breaks and ir breathing Compress Tx Pressure Decompress Decompress periods Begins Reached Begins Ends (leave unused spaces blank) Chamber Pressure (ATA '1 2 2 2 '$ 22--2 1 ) Clock Time (24 hr) 13:34 13:51 14:21 14:26 - - - - 14:33 14:40 Treatment Length: 66 (minutes) Treatment Segments: 2 Vital Signs Capillary Blood Glucose Reference Range: 80 - 120 mg / dl [name] Diabetic Blood Glucose Intervention Range: <131 mg/dl or >249 mg/dl Type: Time Vitals Blood Pulse: Respiratory Temperature: Capillary Blood Glucose Pulse Action Taken: Pressure: Rate: Glucose (mg/dl): Meter #: Oximetry (%) Taken: Pre 12:56 125/54 65 16 98.6 130 2 Alerted physician, asymptomatic for hypotension. Post 14:57 126/58 65 16 98.3 165 2 none per protocol Treatment Response Treatment Toleration: Well Treatment Completion Status: Treatment Aborted/Not Restarted Reason: [name] Choice Treatment Notes Patient ate at approximately 12PM. Blood  glucose level was just below the protocol required 131 mg/dL. Alerted physician. Patient cleared for [name]. At approximately 1433 during the sounding of a fire alarm, hyperbaric chambers were evacuated. Patient was unable to tolerate 3 psi/min. Cautiously but quickly chamber was decompressed at around 2 psi/min reaching surface about the same time as the other chamber (1440). Patient denied any problems with her ears. Patient was moved to the stairwell safety zone at approximately 1443. Physician [name] Attestation: I certify that I supervised this [name] treatment in accordance with Medicare guidelines. A trained emergency response team is readily available per Yes hospital policies and procedures. Continue HBOT as ordered. Yes Electronic Signature(s) Signed: 12/10/2021 4:17:25 PM By: [name] MD FACS Previous Signature: 12/10/2021 4:01:19 PM Version By: [name] CHT EMT BS , , Entered By: [name] on 12/10/2021 16:17:25 -------------------------------------------------------------------------------- [name] Safety Checklist Details Patient Name: Date of Service: HO [name], [name] 12/10/2021 1:00 PM Medical Record Number: [id number] Patient Account Number: [id number] Date of Birth/Sex: Treating RN: 02-02-1956 (66 y.o. [name] Primary Care Deisha Stull: [name] Other Clinician: [name] Referring Oddie Bottger: Treating Elisama Thissen/Extender: Maxwell Marion in Treatment: 14 [name] Safety Checklist Items Safety Checklist Consent Form Signed Patient voided / foley secured and emptied When did you last eato 1200 Last dose of injectable or oral agent 12/05/2021 Ostomy pouch emptied and vented if applicable Urostomy attached to collection bag All implantable devices assessed, documented and approved Silicone implants Intravenous access site secured and place NA Valuables secured Linens and cotton and cotton/polyester blend (less than  51% polyester) Personal oil-based products / skin lotions / body lotions removed Wigs or hairpieces removed NA Smoking or tobacco materials removed NA Books / newspapers / magazines / loose paper removed Cologne, aftershave, perfume and deodorant removed Jewelry removed (may wrap wedding band) Make-up removed Hair care  products removed Battery operated devices (external) removed Heating patches and chemical warmers removed Titanium eyewear removed NA Plastic frames Nail polish cured greater than 10 hours NA Casting material cured greater than 10 hours NA Hearing aids removed NA Loose dentures or partials removed NA Prosthetics have been removed NA Patient demonstrates correct use of air break device (if applicable) Patient concerns have been addressed Patient grounding bracelet on and cord attached to chamber Specifics for Inpatients (complete in addition to above) Medication sheet sent with patient NA Intravenous medications needed or due during therapy sent with patient NA Drainage tubes (e.g. nasogastric tube or chest tube secured and vented) NA Endotracheal or Tracheotomy tube secured NA Cuff deflated of air and inflated with saline NA Airway suctioned NA Notes Paper version used prior to treatment. Electronic Signature(s) Signed: 12/18/2021 9:27:07 AM By: [name] CHT EMT BS , , Entered By: [name] on 12/10/2021 13:46:06

## 2021-12-21 ENCOUNTER — Other Ambulatory Visit: Payer: Self-pay | Admitting: Family Medicine

## 2021-12-21 MED ORDER — TIZANIDINE HCL 4 MG PO TABS: 2.0000 mg | ORAL_TABLET | Freq: Three times a day (TID) | ORAL | 3 refills | Status: AC | PRN

## 2021-12-24 DIAGNOSIS — Z4801 Encounter for change or removal of surgical wound dressing: Secondary | ICD-10-CM | POA: Diagnosis not present

## 2021-12-24 DIAGNOSIS — L89319 Pressure ulcer of right buttock, unspecified stage: Secondary | ICD-10-CM | POA: Diagnosis not present

## 2021-12-24 DIAGNOSIS — Z936 Other artificial openings of urinary tract status: Secondary | ICD-10-CM | POA: Diagnosis not present

## 2021-12-24 DIAGNOSIS — Z933 Colostomy status: Secondary | ICD-10-CM | POA: Diagnosis not present

## 2021-12-24 NOTE — Progress Notes (Signed)
Chief Complaint:   OBESITY [name] is here to discuss her progress with her obesity treatment plan along with follow-up of her obesity related diagnoses. [name] is on keeping a food journal and adhering to recommended goals of 1300-1400 calories and 75+ grams of protein daily and states she is following her eating plan approximately 50% of the time. [name] states she is walking for 15-30 minutes 2-3 times per week.  Today's visit was #: 98 Starting weight: 208 lbs Starting date: 04/05/2018 Today's weight: 216 lbs Today's date: 12/10/2021 Total lbs lost to date: 0 Total lbs lost since last in-office visit: 5  Interim History: [name] has done well with weight loss. She has had other health issues and she is frustrated with her health in general.   Subjective:   1. Type 2 diabetes mellitus with stage 4 chronic kidney disease, without long-term current use of insulin (HCC) [name] continues to do well with weight loss. She is stable on Mounjaro with minimal GI upset. Her fasting blood sugars range between 70-120.   2. Vaginal bleeding [name] notes increased bleeping with a prevent tingle. She requests Doxycyline which appeared to help in the past.   Assessment/Plan:   1. Type 2 diabetes mellitus with stage 4 chronic kidney disease, without long-term current use of insulin (HCC) We will refill Mounjaro for 1 month. Good blood sugar control is important to decrease the likelihood of diabetic complications such as nephropathy, neuropathy, limb loss, blindness, coronary artery disease, and death. Intensive lifestyle modification including diet, exercise and weight loss are the first line of treatment for diabetes.   - tirzepatide (MOUNJARO) 7.5 MG/0.5ML Pen; Inject 7.5 mg into the skin once a week.  Dispense: 6 mL; Refill: 0  2. Vaginal bleeding [name] was encouraged to discuss with Dr. Berline Lopes, as this is outside of my speciality.   3. Obesity, Current BMI 39.5 [name] is currently in the action  stage of change. As such, her goal is to continue with weight loss efforts. She has agreed to keeping a food journal and adhering to recommended goals of 1300-1400 calories and 75+ grams of protein daily.   Exercise goals: As is.  Behavioral modification strategies: decreasing simple carbohydrates.  [place] has agreed to follow-up with our clinic in 4 weeks. She was informed of the importance of frequent follow-up visits to maximize her success with intensive lifestyle modifications for her multiple health conditions.   Objective:   Blood pressure (!) 144/69, pulse (!) 59, temperature 97.9 F (36.6 C), height '5\' 2"'$  (1.575 m), weight 216 lb (98 kg), SpO2 100 %. Body mass index is 39.51 kg/m.  General: Cooperative, alert, well developed, in no acute distress. HEENT: Conjunctivae and lids unremarkable. Cardiovascular: Regular rhythm.  Lungs: Normal work of breathing. Neurologic: No focal deficits.   Lab Results  Component Value Date   CREATININE 1.60 (H) 08/19/2021   BUN 30 (H) 08/19/2021   NA 140 08/19/2021   K 3.9 08/19/2021   CL 106 08/19/2021   CO2 25 07/23/2021   Lab Results  Component Value Date   ALT 20 07/23/2021   AST 17 07/23/2021   ALKPHOS 112 07/23/2021   BILITOT 0.4 07/23/2021   Lab Results  Component Value Date   HGBA1C 6.0 07/23/2021   HGBA1C 6.3 (H) 06/16/2020   HGBA1C 7.9 (H) 09/01/2017   HGBA1C 5.3 04/01/2017   HGBA1C 5.8 (H) 09/05/2015   Lab Results  Component Value Date   INSULIN 14.1 06/16/2020   Lab Results  Component Value Date   TSH 1.83 07/23/2021   Lab Results  Component Value Date   CHOL 73 07/23/2021   HDL 43.50 07/23/2021   LDLCALC 12 07/23/2021   TRIG 89.0 07/23/2021   CHOLHDL 2 07/23/2021   Lab Results  Component Value Date   VD25OH 25.9 (L) 06/16/2020   VD25OH 40.8 04/05/2018   Lab Results  Component Value Date   WBC 4.3 10/01/2021   HGB 12.6 10/01/2021   HCT 39.2 10/01/2021   MCV 95.1 10/01/2021   PLT 210 10/01/2021    Lab Results  Component Value Date   IRON 7 (L) 08/31/2017   TIBC 164 (L) 08/31/2017   FERRITIN 27 11/29/2014   Attestation Statements:   Reviewed by clinician on day of visit: allergies, medications, problem list, medical history, surgical history, family history, social history, and previous encounter notes.  Time spent on visit including pre-visit chart review and post-visit care and charting was 45 minutes.    I, Trixie Dredge, am acting as transcriptionist for Dennard Nip, MD.  I have reviewed the above documentation for accuracy and completeness, and I agree with the above. -  Dennard Nip, MD

## 2022-01-07 ENCOUNTER — Ambulatory Visit (INDEPENDENT_AMBULATORY_CARE_PROVIDER_SITE_OTHER): Payer: BC Managed Care – PPO | Admitting: Family Medicine

## 2022-01-07 ENCOUNTER — Ambulatory Visit (INDEPENDENT_AMBULATORY_CARE_PROVIDER_SITE_OTHER): Payer: BC Managed Care – PPO | Admitting: Nurse Practitioner

## 2022-01-07 ENCOUNTER — Encounter (INDEPENDENT_AMBULATORY_CARE_PROVIDER_SITE_OTHER): Payer: Self-pay | Admitting: Nurse Practitioner

## 2022-01-07 VITALS — BP 110/69 | HR 72 | Temp 97.9°F | Ht 62.0 in | Wt 216.0 lb

## 2022-01-07 DIAGNOSIS — E1122 Type 2 diabetes mellitus with diabetic chronic kidney disease: Secondary | ICD-10-CM | POA: Diagnosis not present

## 2022-01-07 DIAGNOSIS — N184 Chronic kidney disease, stage 4 (severe): Secondary | ICD-10-CM

## 2022-01-07 DIAGNOSIS — R221 Localized swelling, mass and lump, neck: Secondary | ICD-10-CM | POA: Diagnosis not present

## 2022-01-07 DIAGNOSIS — E669 Obesity, unspecified: Secondary | ICD-10-CM | POA: Diagnosis not present

## 2022-01-07 DIAGNOSIS — Z7985 Long-term (current) use of injectable non-insulin antidiabetic drugs: Secondary | ICD-10-CM

## 2022-01-07 DIAGNOSIS — Z6839 Body mass index (BMI) 39.0-39.9, adult: Secondary | ICD-10-CM

## 2022-01-07 MED ORDER — TIRZEPATIDE 7.5 MG/0.5ML ~~LOC~~ SOAJ: 7.5000 mg | SUBCUTANEOUS | 0 refills | Status: DC

## 2022-01-10 NOTE — Progress Notes (Unsigned)
Cardiology Office Note:    Date:  01/11/2022   ID:  [name], DOB [birth year], MRN [id number]  PCP:  Ann Held, DO  Cardiologist:  Sinclair Grooms, MD   Referring MD: Carollee Herter, Alferd Apa, *   Chief Complaint  Patient presents with   Atrial Fibrillation   Follow-up    Bleeding on Eliquis from post radiation tissue necrosis and perineum and rectal area.    History of Present Illness:    [name] is a 66 y.o. female with a hx of   DOE, PAF (1% burden), hyperlipidemia, DM II, Essential hypertension, CKD 3, and first degree AVB.   She has low burden atrial fibrillation.  It was identified after she complained of palpitations.  Anticoagulation was started.  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  Significant bleeding is occurring.  She refuses to take more than 1 dose of Eliquis a day.  She is too afraid to not take any anticoagulation.  She has occasional episodes of palpitations that she did prior to the diagnosis of A-fib.  This does not happen every day.  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+, HER2 negative/  12-11-2015 bilateral mastecotmy w/ reconstruction (no radiation and no chemo)   Cancer of corpus uteri, except isthmus ([place]) 09/2004   oncologist-- dr rossi/ dr gorsuch:/   dx endometroid endometrial & ovarian cancer s/p  chemotheapy and surgery(TAH w/ BSO) :  recurrent 11-19-2014 post pelvic surgery and radiation 01-29-2015 to 03-10-2015   Chronic idiopathic neutropenia ([place])    presumed related to chemotherapy March 2006--- followed by dr Alvy Bimler  (treatment w/ G-CSF injections   Chronic nausea    CKD (chronic kidney disease), stage III [facility])    nephrologist-  dr Madelon Lips   Difficult intravenous access    small veins--- hx PICC lines   DM type 2 (diabetes mellitus, type 2) ([place])    endocriologist--  dr Dorothea Ogle [place]   (08-18-2021  per pt checks blood sugar daily in am,  fasting sugar--92--130)   Dysrhythmia    Environmental and seasonal allergies    Generalized muscle weakness    GERD (gastroesophageal reflux disease)    Hiatal hernia    History of abdominal abscess 04/16/2017   post surgery 04-01-2017  --- resolved 10/ 2018   History of COVID-19 05/2021   per pt mild symptoms that resolved   History of gastric polyp    2014  duodenum   History of ileus 04/16/2017   resolved w/ no surgical intervention   History of radiation therapy    01-29-2015 to 03-10-2015  pelvis 50.4Gy   History of small bowel obstruction 01/2019   Hypothyroidism    monitored by dr Legrand Como altheimer   Ileostomy in place [facility]) 04/01/2017   created at same time colostomy takedown./  permnant 09/ 2020   Mixed dyslipidemia    Multiple thyroid nodules    Managed by Dr. Harlow Asa   PAF (paroxysmal atrial fibrillation) [facility])    cardiologist--- dr h. Tamala Julian;  event monitor 05/ 2021 in epic, NSR with 1% burden Afib, first degree heart  blockl and PACs;  echo 04/ 2021 ef 55-60%, mild LVH   Peripheral neuropathy    PONV (postoperative nausea and vomiting)    "scopolamine patch works for me"   Presence of urostomy ([place]) 04/2019   Radiation-induced dermatitis    contact dermatitis , radiation completed, rash only on ankles now.   Vitamin D deficiency    Vulval lesion    Wears glasses     Past Surgical History:  Procedure Laterality Date   APPENDECTOMY     BREAST RECONSTRUCTION WITH PLACEMENT OF TISSUE EXPANDER AND FLEX HD (ACELLULAR HYDRATED DERMIS) Bilateral 12/11/2015   Procedure: BILATERAL BREAST RECONSTRUCTION WITH PLACEMENT OF TISSUE EXPANDERS;   Surgeon: Irene Limbo, MD;  Location: [place];  Service: Plastics;  Laterality: Bilateral;   COLONOSCOPY WITH PROPOFOL N/A 08/21/2013   Procedure: COLONOSCOPY WITH PROPOFOL;  Surgeon: Cleotis Nipper, MD;  [facility];  Service: Endoscopy;  Laterality: N/A;   COLOSTOMY TAKEDOWN N/A 12/04/2014   Procedure: LAPROSCOPIC LYSIS OF ADHESIONS, SPLENIC MOBILIZATION, RELOCATION OF COLOSTOMY, DEBRIDEMENT INITIAL COLOSTOMY SITE;  Surgeon: Michael [name], MD;  [facility];  Service: General;  Laterality: N/A;   CYSTECTOMY W/ URETEROILEAL CONDUIT  04/12/2019   @[facility] ;   INTESTINAL ANASTOMOSIS, PANNICULECTOMY, PELVIS EXENTENTION,  PARTIAL COLECTOMY REMOVAL ILEIUM WITH PERMNANT ILEOCOLOSTOMY   CYSTOGRAM N/A 06/01/2017   Procedure: CYSTOGRAM;  Surgeon: Alexis Frock, MD;  [facility];  Service: Urology;  Laterality: N/A;   CYSTOSCOPY W/ RETROGRADES Right 11/21/2015   Procedure: CYSTOSCOPY WITH RETROGRADE PYELOGRAM;  Surgeon: Alexis Frock, MD;  [facility];  Service: Urology;  Laterality: Right;   CYSTOSCOPY W/ URETERAL STENT PLACEMENT Right 11/21/2015   Procedure: CYSTOSCOPY WITH STENT REPLACEMENT;  Surgeon: Alexis Frock, MD;  [facility];  Service: Urology;  Laterality: Right;   CYSTOSCOPY W/ URETERAL STENT PLACEMENT Right 03/10/2016   Procedure: CYSTOSCOPY WITH STENT REPLACEMENT;  Surgeon: Alexis Frock, MD;  Location[facility];  Service: Urology;  Laterality: Right;   CYSTOSCOPY W/ URETERAL STENT PLACEMENT Right 06/30/2016   Procedure: CYSTOSCOPY WITH RETROGRADE PYELOGRAM/URETERAL STENT EXCHANGE;  Surgeon: Alexis Frock, MD;  Location: [facility];  Service: Urology;  Laterality: Right;   CYSTOSCOPY W/ URETERAL STENT PLACEMENT N/A 06/01/2017   Procedure: CYSTOSCOPY WITH EXAM UNDER ANESTHESIA;  Surgeon: Alexis Frock, MD;  [facility];  Service: Urology;  Laterality: N/A;   CYSTOSCOPY W/ URETERAL STENT PLACEMENT Right 08/17/2017    Procedure: CYSTOSCOPY WITH RETROGRADE PYELOGRAM/URETERAL STENT REMOVAL;  Surgeon: Alexis Frock, MD;  Location: [facility];  Service: Urology;  Laterality: Right;   CYSTOSCOPY WITH RETROGRADE PYELOGRAM, URETEROSCOPY AND STENT PLACEMENT Right 03/20/2015   Procedure: CYSTOSCOPY WITH RETROGRADE PYELOGRAM, URETEROSCOPY WITH BALLOON DILATION AND STENT PLACEMENT ON RIGHT;  Surgeon: Alexis Frock, MD;  Location: [facility];  Service: Urology;  Laterality: Right;   CYSTOSCOPY WITH RETROGRADE PYELOGRAM, URETEROSCOPY AND STENT PLACEMENT Right 05/02/2015   Procedure: CYSTOSCOPY WITH RIGHT RETROGRADE PYELOGRAM,  DIAGNOSTIC URETEROSCOPY AND STENT PULL ;  Surgeon: Alexis Frock, MD;  Location: [facility];  Service: Urology;  Laterality: Right;   CYSTOSCOPY WITH RETROGRADE PYELOGRAM, URETEROSCOPY AND STENT PLACEMENT Right 09/05/2015   Procedure: CYSTOSCOPY WITH RETROGRADE PYELOGRAM,  AND STENT PLACEMENT;  Surgeon: Alexis Frock, MD;  [facility];  Service: Urology;  Laterality: Right;   CYSTOSCOPY WITH RETROGRADE PYELOGRAM, URETEROSCOPY AND STENT PLACEMENT Right 04/01/2017   Procedure: CYSTOSCOPY WITH RETROGRADE PYELOGRAM, URETEROSCOPY AND STENT PLACEMENT;  Surgeon: Alexis Frock, MD;  [facility];  Service: Urology;  Laterality: Right;   CYSTOSCOPY WITH STENT PLACEMENT Right 10/27/2016   Procedure: CYSTOSCOPY WITH STENT CHANGE and right retrograde pyelogram;  Surgeon: Alexis Frock, MD;  Location: [facility];  Service: Urology;  Laterality: Right;   EUS N/A 10/02/2014   Procedure: LOWER ENDOSCOPIC ULTRASOUND (EUS);  Surgeon: Arta Silence, MD;  Location: [facility];  Service: Endoscopy;  Laterality: N/A;   EXCISION SOFT TISSUE MASS RIGHT FOREMAN  12/08/2006   EYE SURGERY  as child   pytosis of eyelids repair   INCISION AND DRAINAGE OF WOUND Bilateral 12/26/2015   Procedure: DEBRIDEMENT OF BILATERAL MASTECTOMY FLAPS;  Surgeon:  Irene Limbo, MD;  Location: [place];  Service: Plastics;  Laterality: Bilateral;   IR CV LINE INJECTION  05/31/2017   IR FLUORO GUIDE CV LINE LEFT  05/31/2017   IR FLUORO GUIDE CV LINE RIGHT  04/06/2017   IR FLUORO GUIDE CV MIDLINE PICC RIGHT  05/30/2017   IR NEPHROSTOGRAM LEFT INITIAL PLACEMENT  09/02/2017   IR NEPHROSTOGRAM LEFT THRU EXISTING ACCESS  11/29/2017   IR NEPHROSTOGRAM RIGHT INITIAL PLACEMENT  09/02/2017   IR NEPHROSTOGRAM RIGHT THRU EXISTING ACCESS  09/13/2017   IR NEPHROSTOGRAM RIGHT THRU EXISTING ACCESS  11/29/2017   IR NEPHROSTOMY EXCHANGE LEFT  11/28/2017   IR NEPHROSTOMY EXCHANGE LEFT  01/05/2018   IR NEPHROSTOMY EXCHANGE LEFT  02/16/2018   IR NEPHROSTOMY EXCHANGE LEFT  03/30/2018   IR NEPHROSTOMY EXCHANGE LEFT  05/12/2018   IR NEPHROSTOMY EXCHANGE LEFT  06/21/2018   IR NEPHROSTOMY EXCHANGE LEFT  08/04/2018   IR NEPHROSTOMY EXCHANGE LEFT  09/18/2018   IR NEPHROSTOMY EXCHANGE LEFT  10/09/2018   IR NEPHROSTOMY EXCHANGE LEFT  10/27/2018   IR NEPHROSTOMY EXCHANGE LEFT  11/21/2018   IR NEPHROSTOMY EXCHANGE LEFT  01/05/2019   IR NEPHROSTOMY EXCHANGE LEFT  02/15/2019   IR NEPHROSTOMY EXCHANGE LEFT  03/29/2019   IR NEPHROSTOMY EXCHANGE RIGHT  10/02/2017   IR NEPHROSTOMY EXCHANGE RIGHT  11/28/2017   IR NEPHROSTOMY EXCHANGE RIGHT  01/05/2018   IR NEPHROSTOMY EXCHANGE RIGHT  02/16/2018   IR NEPHROSTOMY EXCHANGE RIGHT  03/30/2018   IR NEPHROSTOMY EXCHANGE RIGHT  05/12/2018   IR NEPHROSTOMY EXCHANGE RIGHT  06/21/2018   IR NEPHROSTOMY EXCHANGE RIGHT  08/04/2018   IR NEPHROSTOMY EXCHANGE RIGHT  09/18/2018   IR NEPHROSTOMY EXCHANGE RIGHT  10/27/2018   IR NEPHROSTOMY EXCHANGE RIGHT  11/21/2018   IR NEPHROSTOMY EXCHANGE RIGHT  01/05/2019   IR NEPHROSTOMY EXCHANGE RIGHT  02/15/2019   IR NEPHROSTOMY EXCHANGE RIGHT  03/29/2019   IR NEPHROSTOMY PLACEMENT LEFT  10/02/2017   IR RADIOLOGIST EVAL & MGMT  05/03/2017   IR US GUIDE VASC ACCESS LEFT  05/31/2017    IR US GUIDE VASC ACCESS RIGHT  04/06/2017   IR US GUIDE VASC ACCESS RIGHT  05/30/2017   LAPAROSCOPIC CHOLECYSTECTOMY  1990   LIPOSUCTION WITH LIPOFILLING Bilateral 04/16/2016   Procedure: LIPOSUCTION WITH LIPOFILLING TO BILATERAL CHEST;  Surgeon: Irene Limbo, MD;  Location: [place];  Service: Plastics;  Laterality: Bilateral;   MASTECTOMY W/ SENTINEL NODE BIOPSY Bilateral 12/11/2015   Procedure: RIGHT PROPHYLACTIC MASTECTOMY, LEFT TOTAL MASTECTOMY WITH LEFT AXILLARY SENTINEL LYMPH NODE BIOPSY;  Surgeon: Stark Klein, MD;  Location: [place];  Service: General;  Laterality: Bilateral;   OSTOMY N/A 11/19/2014   Procedure: OSTOMY;  Surgeon: Michael [name], MD;  [facility];  Service: General;  Laterality: N/A;   PROCTOSCOPY N/A 04/01/2017   Procedure: RIDGE PROCTOSCOPY;  Surgeon: Michael [name], MD;  [facility];  Service: General;  Laterality: N/A;   REMOVAL OF BILATERAL TISSUE EXPANDERS WITH PLACEMENT OF BILATERAL BREAST IMPLANTS Bilateral 04/16/2016   Procedure: REMOVAL OF BILATERAL TISSUE EXPANDERS WITH PLACEMENT OF BILATERAL BREAST IMPLANTS;  Surgeon: Irene Limbo, MD;  Location: [place];  Service: Plastics;  Laterality: Bilateral;   ROBOTIC ASSISTED LAP VAGINAL HYSTERECTOMY N/A 11/19/2014   Procedure: ROBOTIC LYSIS OF ADHESIONS, CONVERTED TO LAPAROTOMY RADICAL UPPER VAGINECTOMY,LOW ANTERIOR BOWEL RESECTION, COLOSTOMY, BILATERAL URETERAL STENT PLACEMENT AND CYSTONOMY CLOSURE;  Surgeon: Everitt Amber, MD;  [facility];  Service: Gynecology;  Laterality: N/A;   TISSUE EXPANDER FILLING Bilateral 12/26/2015   Procedure: EXPANSION OF BILATERAL CHEST TISSUE EXPANDERS (60 mL- Right; 75 mL- Left);  Surgeon: Irene Limbo, MD;  Location: [place];  Service: Plastics;  Laterality: Bilateral;   TONSILLECTOMY     TOTAL ABDOMINAL HYSTERECTOMY  March 2006   [facility]   and Bilateral Salpingoophorectomy/  staging for Ovarian cancer/  an    VULVAR LESION REMOVAL N/A 08/19/2021   Procedure: VULVAR LESION with biopsies;  Surgeon: Lafonda Mosses, MD;  Location: [facility];  Service: Gynecology;  Laterality: N/A;   XI ROBOTIC ASSISTED LOWER ANTERIOR RESECTION N/A 04/01/2017   Procedure: XI ROBOTIC VS LAPAROSCOPIC COLOSTOMY TAKEDOWN WITH LYSIS OF ADHESIONS.;  Surgeon: Michael [name], MD;  [facility];  Service: General;  Laterality: N/A;  ERAS PATHWAY    Current Medications: Current Meds  Medication Sig   acetaminophen (TYLENOL) 500 MG tablet Take 500 mg by mouth every 6 (six) hours as needed.   albuterol (PROVENTIL HFA) 108 (90 Base) MCG/ACT inhaler Inhale 1 puff into the lungs every 6 (six) hours as needed for wheezing or shortness of breath. (Patient taking differently: Inhale 1 puff into the lungs every 6 (six) hours as needed for wheezing or shortness of breath.)   Apixaban (ELIQUIS PO) Take 5 mg by mouth daily. Pt takes 1tablet 84m once a day.   Biotin 5 MG TABS Take 5 mg by mouth daily.    buPROPion (WELLBUTRIN XL) 150 MG 24 hr tablet Take 150 mg by mouth daily.   calcium citrate-vitamin D (CITRACAL+D) 315-200 MG-UNIT tablet Take 1 tablet by mouth daily.   Cholecalciferol (VITAMIN D3) 10000 UNITS capsule Take 10,000 Units by mouth See admin instructions. Take 1 tablet by mouth 43times per week.   clobetasol (OLUX) 0.05 % topical foam Apply topically 2 (two) times daily. (Patient taking differently: Apply topically 2 (two) times daily as needed.)   diphenhydrAMINE (BENADRYL) 25 mg capsule Take 1 capsule (25 mg total) by mouth every 8 (eight) hours as needed for itching, allergies or sleep.   famotidine-calcium carbonate-magnesium hydroxide (PEPCID COMPLETE) 10-800-165 MG chewable tablet Chew 1 tablet by mouth as needed.   ferrous sulfate 325 (65 FE) MG tablet Take 1 tablet (325 mg total) by mouth at bedtime. (Patient taking differently: Take 325 mg by mouth at bedtime.)   fluticasone (FLONASE) 50 MCG/ACT  nasal spray SHAKE LIQUID AND USE 2 SPRAYS IN EACH NOSTRIL DAILY   HYDROcodone bit-homatropine (HYCODAN) 5-1.5 MG/5ML syrup Take 5 mLs by mouth every 6 (six) hours as needed for cough.   levothyroxine (SYNTHROID, LEVOTHROID) 150 MCG tablet Take 1 tablet (150 mcg total) by mouth daily before breakfast. (Patient taking differently: Take 150 mcg by mouth as directed. ONE DAILY WITH EXCEPTION ON SUDAY'S TAKE 2 TABS)   loratadine (CLARITIN) 10 MG tablet Take 10 mg by mouth  daily.    Melatonin 10 MG TABS Take 1 tablet by mouth at bedtime.   omega-3 acid ethyl esters (LOVAZA) 1 G capsule Take 1 g by mouth 2 (two) times daily.    Polyethyl Glycol-Propyl Glycol (SYSTANE OP) Place 1 drop into both eyes daily as needed (dry eyes).    pregabalin (LYRICA) 50 MG capsule Take 1 capsule (50 mg total) by mouth 2 (two) times daily. (Patient taking differently: Take 50 mg by mouth 2 (two) times daily.)   Prenatal Vit-Fe Fumarate-FA (PRENATAL VITAMIN PO) Take 1 capsule by mouth daily. Takes prenatal because there are no dyes in it   Probiotic Product (ALIGN PO) Take 1 capsule by mouth daily.   rifaximin (XIFAXAN) 550 MG TABS tablet Take 550 mg by mouth See admin instructions. Take one tablet by mouth three times daily for 14 days when needed.   rosuvastatin (CRESTOR) 10 MG tablet Take 10 mg by mouth every evening.   Saccharomyces boulardii (FLORASTOR PO) Take 1 capsule by mouth at bedtime.   Tazarotene (ARAZLO) 0.045 % LOTN Apply 1 application topically daily.   Tbo-Filgrastim (GRANIX) 480 MCG/0.8ML SOSY injection 480 mcg subcutaneous injection every 6 days life long   tirzepatide (MOUNJARO) 7.5 MG/0.5ML Pen Inject 7.5 mg into the skin once a week.   tiZANidine (ZANAFLEX) 4 MG tablet Take 0.5-1 tablets (2-4 mg total) by mouth every 8 (eight) hours as needed for muscle spasms.     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, sports - retired  Tobacco Use   Smoking status: Never   Smokeless tobacco: Never  Vaping Use   Vaping Use: Never used  Substance and Sexual Activity   Alcohol use: Not Currently   Drug use: No   Sexual activity: Not on file  Other Topics Concern   Not on file  Social History Narrative   Exercise-- has not gotten back into it since cancer came back   Social Determinants of Health   Financial Resource Strain: Not on file  Food Insecurity: Not on file  Transportation Needs: Not on file  Physical Activity: Not on file  Stress: Not on file  Social Connections: Not on file     Family History: The patient's family history includes Brain cancer in her paternal grandfather; Breast cancer in her maternal aunt; Cancer (age of onset: 24) in her mother; Cancer (age of onset: 6) in her father; Diabetes in her brother, father, and sister; Heart disease in her brother and father; Hyperlipidemia in her father; Hypertension in her father and mother; Hypertension (age of onset: y-10) in her brother; Lymphoma in her paternal aunt; Obesity in her father; Ovarian cancer in an other family member.  ROS:   Please see the history of present illness.    Somewhat depressed about the bleeding in the vaginal and rectal area.  This causes hygiene issues and interferes with daily activities.  All other systems reviewed and are negative.  EKGs/Labs/Other Studies Reviewed:    The following studies were reviewed today: No new data  EKG:  EKG not repeated  Recent  Labs: 07/23/2021: ALT 20; TSH 1.83 08/19/2021: BUN 30; Creatinine, Ser 1.60; Potassium 3.9;  Sodium 140 10/01/2021: Hemoglobin 12.6; Platelets 210  Recent Lipid Panel    Component Value Date/Time   CHOL 73 07/23/2021 1333   CHOL 92 (L) 06/16/2020 0834   TRIG 89.0 07/23/2021 1333   HDL 43.50 07/23/2021 1333   HDL 49 06/16/2020 0834   CHOLHDL 2 07/23/2021 1333   VLDL 17.8 07/23/2021 1333   LDLCALC 12 07/23/2021 1333   LDLCALC 21 06/16/2020 0834   LDLCALC 13 01/18/2020 1440    Physical Exam:    VS:  BP 118/60   Pulse 82   Ht 5' 2"  (1.575 m)   Wt 220 lb (99.8 kg)   SpO2 99%   BMI 40.24 kg/m     Wt Readings from Last 3 Encounters:  01/11/22 220 lb (99.8 kg)  01/07/22 216 lb (98 kg)  12/10/21 216 lb (98 kg)     GEN: Overweight. No acute distress HEENT: Normal NECK: No JVD. LYMPHATICS: No lymphadenopathy CARDIAC: No murmur. RRR no gallop, or edema. VASCULAR:  Normal Pulses. No bruits. RESPIRATORY:  Clear to auscultation without rales, wheezing or rhonchi  ABDOMEN: Soft, non-tender, non-distended, No pulsatile mass, MUSCULOSKELETAL: No deformity  SKIN: Warm and dry NEUROLOGIC:  Alert and oriented x 3 PSYCHIATRIC:  Normal affect   ASSESSMENT:    1. Paroxysmal atrial fibrillation (HCC)   2. Hyperlipidemia, unspecified hyperlipidemia type   3. Stage 3 chronic kidney disease, unspecified whether stage 3a or 3b CKD ([place])   4. Type 2 diabetes mellitus with stage 4 chronic kidney disease, without long-term current use of insulin ([place])   5. 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  She refuses to take Eliquis twice daily.  Once per day is still associated with significant bleeding.  We discussed the options of no anticoagulation and or to consider EP consultation for possible left atrial appendage occlusion with Watchman.  We will refer her to Dr. Quentin Ore  for consultation concerning the need for anticoagulation and low burden situation in her case and also instructions concerning Watchman. Continue [place] Did not discuss Target A1c less than 7 Blood pressure control is excellent.  I plan to see her back in 6 months.  Dr. Quentin Ore will guide [place] relative to management of atrial fibs.  Can she do without anticoagulation altogether?  Is she a candidate for Watchman and anticoagulation discontinuation?   Medication Adjustments/Labs and Tests Ordered: Current medicines are reviewed at length with the patient today.  Concerns regarding medicines are outlined above.  Orders Placed This Encounter  Procedures   CBC   Basic metabolic panel   Ambulatory referral to Cardiac Electrophysiology   No orders of the defined types were placed in this encounter.   Patient Instructions  Medication Instructions:  Your physician recommends that you continue on your current medications as directed. Please refer to the Current Medication list given to you today.  *If you need a refill on your cardiac medications before your next appointment, please call your pharmacy*  Lab Work: TODAY: CBC, BMET If you have labs (blood work) drawn today and your tests are completely normal, you will receive your results only by: [place] (if you have MyChart) OR A paper copy in  the mail If you have any lab test that is abnormal or we need to change your treatment, we will call you to review the results.  Testing/Procedures: NONE  Follow-Up: At [facility], you and your health needs are our priority.  As part of our continuing mission to provide you with exceptional heart care, we have created designated Provider Care Teams.  These Care Teams include your primary Cardiologist (physician) and Advanced Practice Providers (APPs -  Physician Assistants and Nurse Practitioners) who all work together to provide you with the care you need, when you need it.  Your next  appointment:   6 month(s)  The format for your next appointment:   In Person  Provider:   Sinclair Grooms, MD {  Other Instructions Your physician has referred you to our electrophysiology (EP) department here at our [facility] office. Dr. Lysbeth Galas Lambert's scheduler will call you to schedule an appointment.  Important Information About Sugar         Signed, Sinclair Grooms, MD  01/11/2022 4:03 PM    [facility]

## 2022-01-11 ENCOUNTER — Ambulatory Visit (INDEPENDENT_AMBULATORY_CARE_PROVIDER_SITE_OTHER): Payer: BC Managed Care – PPO | Admitting: Interventional Cardiology

## 2022-01-11 ENCOUNTER — Encounter: Payer: Self-pay | Admitting: Interventional Cardiology

## 2022-01-11 VITALS — BP 118/60 | HR 82 | Ht 62.0 in | Wt 220.0 lb

## 2022-01-11 DIAGNOSIS — N183 Chronic kidney disease, stage 3 unspecified: Secondary | ICD-10-CM | POA: Diagnosis not present

## 2022-01-11 DIAGNOSIS — N184 Chronic kidney disease, stage 4 (severe): Secondary | ICD-10-CM

## 2022-01-11 DIAGNOSIS — E785 Hyperlipidemia, unspecified: Secondary | ICD-10-CM

## 2022-01-11 DIAGNOSIS — I10 Essential (primary) hypertension: Secondary | ICD-10-CM | POA: Diagnosis not present

## 2022-01-11 DIAGNOSIS — I480 Paroxysmal atrial fibrillation: Secondary | ICD-10-CM | POA: Diagnosis not present

## 2022-01-11 DIAGNOSIS — E1122 Type 2 diabetes mellitus with diabetic chronic kidney disease: Secondary | ICD-10-CM

## 2022-01-11 NOTE — Patient Instructions (Signed)
Medication Instructions:  Your physician recommends that you continue on your current medications as directed. Please refer to the Current Medication list given to you today.  *If you need a refill on your cardiac medications before your next appointment, please call your pharmacy*  Lab Work: TODAY: CBC, BMET If you have labs (blood work) drawn today and your tests are completely normal, you will receive your results only by: [place] (if you have MyChart) OR A paper copy in the mail If you have any lab test that is abnormal or we need to change your treatment, we will call you to review the results.  Testing/Procedures: NONE  Follow-Up: At [facility], you and your health needs are our priority.  As part of our continuing mission to provide you with exceptional heart care, we have created designated Provider Care Teams.  These Care Teams include your primary Cardiologist (physician) and Advanced Practice Providers (APPs -  Physician Assistants and Nurse Practitioners) who all work together to provide you with the care you need, when you need it.  Your next appointment:   6 month(s)  The format for your next appointment:   In Person  Provider:   Sinclair Grooms, MD {  Other Instructions Your physician has referred you to our electrophysiology (EP) department here at our [facility] office. Dr. Lysbeth Galas Lambert's scheduler will call you to schedule an appointment.  Important Information About Sugar

## 2022-01-12 DIAGNOSIS — K117 Disturbances of salivary secretion: Secondary | ICD-10-CM | POA: Diagnosis not present

## 2022-01-12 DIAGNOSIS — K1120 Sialoadenitis, unspecified: Secondary | ICD-10-CM | POA: Diagnosis not present

## 2022-01-12 LAB — CBC
Hematocrit: 35.5 % (ref 34.0–46.6)
Hemoglobin: 11.5 g/dL (ref 11.1–15.9)
MCH: 30.5 pg (ref 26.6–33.0)
MCHC: 32.4 g/dL (ref 31.5–35.7)
MCV: 94 fL (ref 79–97)
Platelets: 207 10*3/uL (ref 150–450)
RBC: 3.77 x10E6/uL (ref 3.77–5.28)
RDW: 13.1 % (ref 11.7–15.4)
WBC: 8.9 10*3/uL (ref 3.4–10.8)

## 2022-01-12 LAB — BASIC METABOLIC PANEL
BUN/Creatinine Ratio: 16 (ref 12–28)
BUN: 29 mg/dL — ABNORMAL HIGH (ref 8–27)
CO2: 20 mmol/L (ref 20–29)
Calcium: 9.2 mg/dL (ref 8.7–10.3)
Chloride: 105 mmol/L (ref 96–106)
Creatinine, Ser: 1.76 mg/dL — ABNORMAL HIGH (ref 0.57–1.00)
Glucose: 151 mg/dL — ABNORMAL HIGH (ref 70–99)
Potassium: 4.8 mmol/L (ref 3.5–5.2)
Sodium: 141 mmol/L (ref 134–144)
eGFR: 31 mL/min/{1.73_m2} — ABNORMAL LOW (ref >59)

## 2022-01-12 NOTE — Progress Notes (Signed)
Chief Complaint:   OBESITY [name] is here to discuss her progress with her obesity treatment plan along with follow-up of her obesity related diagnoses. [name] is on keeping a food journal and adhering to recommended goals of 1300-1400 calories and 75 grams of protein and states she is following her eating plan approximately 75% of the time. [name] states she is walking 30 minutes 3 times per week.  Today's visit was #: 16 Starting weight: 208 lbs Starting date: 04/05/2022 Today's weight: 216 lbs Today's date: 01/07/2022 Total lbs lost to date: 0 lbs Total lbs lost since last in-office visit: 0  Interim History: [name] is frustrated with weight loss. She has been active, drinking more water and has decreased her diet Cokes.  Subjective:   1. Type 2 diabetes mellitus with stage 4 chronic kidney disease, without long-term current use of insulin (HCC) [name] is currently taking Mounjaro 7.5 mg. Denies any side effects.  2. Neck swelling [name] has had 2 episodes of edema in the right facial/cheek/neck twice over the past month. Swelling last about 1-2 days. She reports the site is uncomfortable; uses cold compression with relief.  Assessment/Plan:   1. Type 2 diabetes mellitus with stage 4 chronic kidney disease, without long-term current use of insulin (HCC) We will refill Mounjaro 7.5 mg SubQ for 1 month with 0 refills. Denies any side effects.  -Refill tirzepatide (MOUNJARO) 7.5 MG/0.5ML Pen; Inject 7.5 mg into the skin once a week.  Dispense: 6 mL; Refill: 0  2. Neck swelling We will refer patient back to ENT. Called ENT and scheduled an appointment for 01/12/22  3. Obesity, Current BMI 39.6 [name] is currently in the action stage of change. As such, her goal is to continue with weight loss efforts. She has agreed to keeping a food journal and adhering to recommended goals of 13-1400 calories and 75+ grams of protein.   Exercise goals: As is.  Behavioral modification strategies:  increasing lean protein intake, increasing water intake, and planning for success.  [name] has agreed to follow-up with our clinic in 3 weeks. She was informed of the importance of frequent follow-up visits to maximize her success with intensive lifestyle modifications for her multiple health conditions.   Objective:   Blood pressure 110/69, pulse 72, temperature 97.9 F (36.6 C), height '5\' 2"'$  (1.575 m), weight 216 lb (98 kg), SpO2 100 %. Body mass index is 39.51 kg/m.  General: Cooperative, alert, well developed, in no acute distress. HEENT: Conjunctivae and lids unremarkable. Cardiovascular: Regular rhythm.  Lungs: Normal work of breathing. Neurologic: No focal deficits.   Lab Results  Component Value Date   CREATININE 1.76 (H) 01/11/2022   BUN 29 (H) 01/11/2022   NA 141 01/11/2022   K 4.8 01/11/2022   CL 105 01/11/2022   CO2 20 01/11/2022   Lab Results  Component Value Date   ALT 20 07/23/2021   AST 17 07/23/2021   ALKPHOS 112 07/23/2021   BILITOT 0.4 07/23/2021   Lab Results  Component Value Date   HGBA1C 6.0 07/23/2021   HGBA1C 6.3 (H) 06/16/2020   HGBA1C 7.9 (H) 09/01/2017   HGBA1C 5.3 04/01/2017   HGBA1C 5.8 (H) 09/05/2015   Lab Results  Component Value Date   INSULIN 14.1 06/16/2020   Lab Results  Component Value Date   TSH 1.83 07/23/2021   Lab Results  Component Value Date   CHOL 73 07/23/2021   HDL 43.50 07/23/2021   LDLCALC 12 07/23/2021   TRIG  89.0 07/23/2021   CHOLHDL 2 07/23/2021   Lab Results  Component Value Date   VD25OH 25.9 (L) 06/16/2020   VD25OH 40.8 04/05/2018   Lab Results  Component Value Date   WBC 8.9 01/11/2022   HGB 11.5 01/11/2022   HCT 35.5 01/11/2022   MCV 94 01/11/2022   PLT 207 01/11/2022   Lab Results  Component Value Date   IRON 7 (L) 08/31/2017   TIBC 164 (L) 08/31/2017   FERRITIN 27 11/29/2014   Attestation Statements:   Reviewed by clinician on day of visit: allergies, medications, problem list,  medical history, surgical history, family history, social history, and previous encounter notes.  Time spent on visit including pre-visit chart review and post-visit care and charting was 30 minutes.   I, Brendell Tyus, RMA, am acting as transcriptionist for Everardo Pacific, FNP..  I have reviewed the above documentation for accuracy and completeness, and I agree with the above. Everardo Pacific, FNP

## 2022-01-14 ENCOUNTER — Other Ambulatory Visit: Payer: Self-pay | Admitting: Family Medicine

## 2022-01-14 DIAGNOSIS — G8929 Other chronic pain: Secondary | ICD-10-CM

## 2022-01-15 NOTE — Telephone Encounter (Signed)
Requesting: Lyrica '50mg'$   Contract: None UDS: None Last Visit: 07/23/2021 Next Visit: 02/15/22 Last Refill: 07/23/2021 #60 and 5RF  Please Advise

## 2022-01-22 ENCOUNTER — Encounter: Payer: BC Managed Care – PPO | Admitting: Family Medicine

## 2022-01-28 ENCOUNTER — Ambulatory Visit (INDEPENDENT_AMBULATORY_CARE_PROVIDER_SITE_OTHER): Payer: BC Managed Care – PPO | Admitting: Nurse Practitioner

## 2022-01-28 VITALS — BP 118/74 | HR 74 | Temp 97.7°F | Ht 62.0 in | Wt 213.0 lb

## 2022-01-28 DIAGNOSIS — E669 Obesity, unspecified: Secondary | ICD-10-CM

## 2022-01-28 DIAGNOSIS — E559 Vitamin D deficiency, unspecified: Secondary | ICD-10-CM | POA: Diagnosis not present

## 2022-01-28 DIAGNOSIS — N184 Chronic kidney disease, stage 4 (severe): Secondary | ICD-10-CM

## 2022-01-28 DIAGNOSIS — E1122 Type 2 diabetes mellitus with diabetic chronic kidney disease: Secondary | ICD-10-CM | POA: Diagnosis not present

## 2022-01-28 DIAGNOSIS — K1120 Sialoadenitis, unspecified: Secondary | ICD-10-CM | POA: Diagnosis not present

## 2022-01-28 DIAGNOSIS — Z7985 Long-term (current) use of injectable non-insulin antidiabetic drugs: Secondary | ICD-10-CM

## 2022-01-28 DIAGNOSIS — Z6839 Body mass index (BMI) 39.0-39.9, adult: Secondary | ICD-10-CM

## 2022-01-28 MED ORDER — TIRZEPATIDE 10 MG/0.5ML ~~LOC~~ SOAJ: 10.0000 mg | SUBCUTANEOUS | 0 refills | Status: DC

## 2022-02-01 NOTE — Progress Notes (Signed)
Chief Complaint:   OBESITY [name] is here to discuss her progress with her obesity treatment plan along with follow-up of her obesity related diagnoses. [name] is on keeping a food journal and adhering to recommended goals of 1300-1400 calories and 75 grams of protein and states she is following her eating plan approximately 60% of the time. [name] states she is walking 30 minutes 3 times per week.  Today's visit was #: 73 Starting weight: 208 lbs Starting date: 04/05/2018 Today's weight: 213 lbs Today's date: 01/28/2022 Total lbs lost to date: 0 lbs Total lbs lost since last in-office visit: 3  Interim History: [name] has done well with weight loss since her last visit. She has been on vacation since her last visit. She's walking a lot on vacation. She is drinking water and limited diet [name].  Subjective:   1. Vitamin D deficiency Elias is taking Vit D per Endo.  2. Type 2 diabetes mellitus with stage 4 chronic kidney disease, without long-term current use of insulin (HCC) Vicenta is currently taking Mounjaro 7.5 mg. Denies any side effects. She notes hunger and cravings. Fasting Blood sugars: 93-160's, rare episodes of fasting blood sugar: 80's. Her last A1c was 6.0.  3. Parotitis [name] saw Dr. Constance Holster on 01/12/2022. Edema has decreased and she is doing better.   Assessment/Plan:   1. Vitamin D deficiency Take Vit D as directed per Endo.   Low Vitamin D level contributes to fatigue and are associated with obesity, breast, and colon cancer.   2. Type 2 diabetes mellitus with stage 4 chronic kidney disease, without long-term current use of insulin (HCC) Will Increase Mounjaro to 10 mg SubQ once weekly for 1 month with 0 refills.  Side effects discussed.  To monitor BS at home and to monitor for hypoglycemia.    Good blood sugar control is important to decrease the likelihood of diabetic complications such as nephropathy, neuropathy, limb loss, blindness, coronary artery disease, and  death. Intensive lifestyle modification including diet, exercise and weight loss are the first line of treatment for diabetes.    -Increase tirzepatide (MOUNJARO) 10 MG/0.5ML Pen; Inject 10 mg into the skin once a week.  Dispense: 6 mL; Refill: 0  3. Parotitis Doing better. Will continue to monitor.  4. Obesity, Current BMI 39.0 [name] is currently in the action stage of change. As such, her goal is to continue with weight loss efforts. She has agreed to keeping a food journal and adhering to recommended goals of 1300-1400 calories and 75+ grams of protein.   Exercise goals: All adults should avoid inactivity. Some physical activity is better than none, and adults who participate in any amount of physical activity gain some health benefits.  Behavioral modification strategies: increasing water intake, no skipping meals, and planning for success.  [name] has agreed to follow-up with our clinic in 3 weeks. She was informed of the importance of frequent follow-up visits to maximize her success with intensive lifestyle modifications for her multiple health conditions.   Objective:   Blood pressure 118/74, pulse 74, temperature 97.7 F (36.5 C), height '5\' 2"'$  (1.575 m), weight 213 lb (96.6 kg), SpO2 98 %. Body mass index is 38.96 kg/m.  General: Cooperative, alert, well developed, in no acute distress. HEENT: Conjunctivae and lids unremarkable. Cardiovascular: Regular rhythm.  Lungs: Normal work of breathing. Neurologic: No focal deficits.   Lab Results  Component Value Date   CREATININE 1.76 (H) 01/11/2022   BUN 29 (H) 01/11/2022  NA 141 01/11/2022   K 4.8 01/11/2022   CL 105 01/11/2022   CO2 20 01/11/2022   Lab Results  Component Value Date   ALT 20 07/23/2021   AST 17 07/23/2021   ALKPHOS 112 07/23/2021   BILITOT 0.4 07/23/2021   Lab Results  Component Value Date   HGBA1C 6.0 07/23/2021   HGBA1C 6.3 (H) 06/16/2020   HGBA1C 7.9 (H) 09/01/2017   HGBA1C 5.3 04/01/2017    HGBA1C 5.8 (H) 09/05/2015   Lab Results  Component Value Date   INSULIN 14.1 06/16/2020   Lab Results  Component Value Date   TSH 1.83 07/23/2021   Lab Results  Component Value Date   CHOL 73 07/23/2021   HDL 43.50 07/23/2021   LDLCALC 12 07/23/2021   TRIG 89.0 07/23/2021   CHOLHDL 2 07/23/2021   Lab Results  Component Value Date   VD25OH 25.9 (L) 06/16/2020   VD25OH 40.8 04/05/2018   Lab Results  Component Value Date   WBC 8.9 01/11/2022   HGB 11.5 01/11/2022   HCT 35.5 01/11/2022   MCV 94 01/11/2022   PLT 207 01/11/2022   Lab Results  Component Value Date   IRON 7 (L) 08/31/2017   TIBC 164 (L) 08/31/2017   FERRITIN 27 11/29/2014   Attestation Statements:   Reviewed by clinician on day of visit: allergies, medications, problem list, medical history, surgical history, family history, social history, and previous encounter notes.  I, Brendell Tyus, RMA, am acting as transcriptionist for Everardo Pacific, FNP.  I have reviewed the above documentation for accuracy and completeness, and I agree with the above. Everardo Pacific, FNP

## 2022-02-05 DIAGNOSIS — Z933 Colostomy status: Secondary | ICD-10-CM | POA: Diagnosis not present

## 2022-02-05 DIAGNOSIS — L89319 Pressure ulcer of right buttock, unspecified stage: Secondary | ICD-10-CM | POA: Diagnosis not present

## 2022-02-11 DIAGNOSIS — Z933 Colostomy status: Secondary | ICD-10-CM | POA: Diagnosis not present

## 2022-02-11 DIAGNOSIS — Z936 Other artificial openings of urinary tract status: Secondary | ICD-10-CM | POA: Diagnosis not present

## 2022-02-11 DIAGNOSIS — N1832 Chronic kidney disease, stage 3b: Secondary | ICD-10-CM | POA: Diagnosis not present

## 2022-02-11 DIAGNOSIS — E872 Acidosis, unspecified: Secondary | ICD-10-CM | POA: Diagnosis not present

## 2022-02-12 ENCOUNTER — Other Ambulatory Visit: Payer: Self-pay

## 2022-02-12 ENCOUNTER — Encounter: Payer: Self-pay | Admitting: Hematology and Oncology

## 2022-02-12 ENCOUNTER — Telehealth: Payer: Self-pay

## 2022-02-12 MED ORDER — TBO-FILGRASTIM 480 MCG/0.8ML ~~LOC~~ SOSY: PREFILLED_SYRINGE | SUBCUTANEOUS | 11 refills | Status: DC

## 2022-02-12 NOTE — Telephone Encounter (Signed)
Spoke with patient in response to my chart message . [facility] e-scribed to [facility]. Patient provided with phone number to [facility] to clarify her insurance information.

## 2022-02-15 ENCOUNTER — Ambulatory Visit (INDEPENDENT_AMBULATORY_CARE_PROVIDER_SITE_OTHER): Payer: BC Managed Care – PPO | Admitting: Cardiology

## 2022-02-15 ENCOUNTER — Encounter: Payer: Self-pay | Admitting: Family Medicine

## 2022-02-15 ENCOUNTER — Ambulatory Visit (INDEPENDENT_AMBULATORY_CARE_PROVIDER_SITE_OTHER): Payer: BC Managed Care – PPO | Admitting: Family Medicine

## 2022-02-15 ENCOUNTER — Encounter: Payer: Self-pay | Admitting: Cardiology

## 2022-02-15 VITALS — BP 120/70 | HR 62 | Temp 97.5°F | Resp 18 | Ht 62.0 in | Wt 221.0 lb

## 2022-02-15 VITALS — BP 129/76 | HR 61 | Ht 62.0 in | Wt 223.2 lb

## 2022-02-15 DIAGNOSIS — E1151 Type 2 diabetes mellitus with diabetic peripheral angiopathy without gangrene: Secondary | ICD-10-CM

## 2022-02-15 DIAGNOSIS — R000 Tachycardia, unspecified: Secondary | ICD-10-CM | POA: Diagnosis not present

## 2022-02-15 DIAGNOSIS — N184 Chronic kidney disease, stage 4 (severe): Secondary | ICD-10-CM

## 2022-02-15 DIAGNOSIS — E039 Hypothyroidism, unspecified: Secondary | ICD-10-CM

## 2022-02-15 DIAGNOSIS — E559 Vitamin D deficiency, unspecified: Secondary | ICD-10-CM | POA: Diagnosis not present

## 2022-02-15 DIAGNOSIS — Z8543 Personal history of malignant neoplasm of ovary: Secondary | ICD-10-CM

## 2022-02-15 DIAGNOSIS — E1122 Type 2 diabetes mellitus with diabetic chronic kidney disease: Secondary | ICD-10-CM | POA: Diagnosis not present

## 2022-02-15 DIAGNOSIS — R300 Dysuria: Secondary | ICD-10-CM

## 2022-02-15 DIAGNOSIS — D473 Essential (hemorrhagic) thrombocythemia: Secondary | ICD-10-CM

## 2022-02-15 DIAGNOSIS — Z0000 Encounter for general adult medical examination without abnormal findings: Secondary | ICD-10-CM

## 2022-02-15 DIAGNOSIS — E785 Hyperlipidemia, unspecified: Secondary | ICD-10-CM | POA: Diagnosis not present

## 2022-02-15 DIAGNOSIS — I10 Essential (primary) hypertension: Secondary | ICD-10-CM

## 2022-02-15 DIAGNOSIS — C569 Malignant neoplasm of unspecified ovary: Secondary | ICD-10-CM

## 2022-02-15 DIAGNOSIS — F321 Major depressive disorder, single episode, moderate: Secondary | ICD-10-CM

## 2022-02-15 DIAGNOSIS — Z6838 Body mass index (BMI) 38.0-38.9, adult: Secondary | ICD-10-CM

## 2022-02-15 DIAGNOSIS — I440 Atrioventricular block, first degree: Secondary | ICD-10-CM

## 2022-02-15 DIAGNOSIS — Z933 Colostomy status: Secondary | ICD-10-CM

## 2022-02-15 LAB — HEMOGLOBIN A1C: Hgb A1c MFr Bld: 6.1 % (ref 4.6–6.5)

## 2022-02-15 LAB — CBC WITH DIFFERENTIAL/PLATELET
Basophils Absolute: 0 10*3/uL (ref 0.0–0.1)
Basophils Relative: 1 % (ref 0.0–3.0)
Eosinophils Absolute: 0.2 10*3/uL (ref 0.0–0.7)
Eosinophils Relative: 3.9 % (ref 0.0–5.0)
HCT: 34.1 % — ABNORMAL LOW (ref 36.0–46.0)
Hemoglobin: 11.1 g/dL — ABNORMAL LOW (ref 12.0–15.0)
Lymphocytes Relative: 18.6 % (ref 12.0–46.0)
Lymphs Abs: 0.9 10*3/uL (ref 0.7–4.0)
MCHC: 32.5 g/dL (ref 30.0–36.0)
MCV: 92.8 fl (ref 78.0–100.0)
Monocytes Absolute: 0.4 10*3/uL (ref 0.1–1.0)
Monocytes Relative: 7.2 % (ref 3.0–12.0)
Neutro Abs: 3.5 10*3/uL (ref 1.4–7.7)
Neutrophils Relative %: 69.3 % (ref 43.0–77.0)
Platelets: 217 10*3/uL (ref 150.0–400.0)
RBC: 3.68 Mil/uL — ABNORMAL LOW (ref 3.87–5.11)
RDW: 14.9 % (ref 11.5–15.5)
WBC: 5.1 10*3/uL (ref 4.0–10.5)

## 2022-02-15 LAB — COMPREHENSIVE METABOLIC PANEL
ALT: 21 U/L (ref 0–35)
AST: 17 U/L (ref 0–37)
Albumin: 4 g/dL (ref 3.5–5.2)
Alkaline Phosphatase: 119 U/L — ABNORMAL HIGH (ref 39–117)
BUN: 25 mg/dL — ABNORMAL HIGH (ref 6–23)
CO2: 24 mEq/L (ref 19–32)
Calcium: 9.1 mg/dL (ref 8.4–10.5)
Chloride: 106 mEq/L (ref 96–112)
Creatinine, Ser: 1.74 mg/dL — ABNORMAL HIGH (ref 0.40–1.20)
GFR: 29.99 mL/min — ABNORMAL LOW (ref >60.00)
Glucose, Bld: 90 mg/dL (ref 70–99)
Potassium: 3.9 mEq/L (ref 3.5–5.1)
Sodium: 139 mEq/L (ref 135–145)
Total Bilirubin: 0.3 mg/dL (ref 0.2–1.2)
Total Protein: 7 g/dL (ref 6.0–8.3)

## 2022-02-15 LAB — LIPID PANEL
Cholesterol: 77 mg/dL (ref 0–200)
HDL: 44.6 mg/dL (ref >39.00)
LDL Cholesterol: 16 mg/dL (ref 0–99)
NonHDL: 32.61
Total CHOL/HDL Ratio: 2
Triglycerides: 82 mg/dL (ref 0.0–149.0)
VLDL: 16.4 mg/dL (ref 0.0–40.0)

## 2022-02-15 LAB — VITAMIN B12: Vitamin B-12: 950 pg/mL — ABNORMAL HIGH (ref 211–911)

## 2022-02-15 LAB — MICROALBUMIN / CREATININE URINE RATIO
Creatinine,U: 76.2 mg/dL
Microalb Creat Ratio: 15.9 mg/g (ref 0.0–30.0)
Microalb, Ur: 12.1 mg/dL — ABNORMAL HIGH (ref 0.0–1.9)

## 2022-02-15 LAB — VITAMIN D 25 HYDROXY (VIT D DEFICIENCY, FRACTURES): VITD: 31.09 ng/mL (ref 30.00–100.00)

## 2022-02-15 LAB — TSH: TSH: 2.53 u[IU]/mL (ref 0.35–5.50)

## 2022-02-15 MED ORDER — DOXYCYCLINE HYCLATE 100 MG PO TABS: 100.0000 mg | ORAL_TABLET | Freq: Two times a day (BID) | ORAL | 0 refills | Status: DC

## 2022-02-15 MED ORDER — ASPIRIN 81 MG PO TBEC: 81.0000 mg | DELAYED_RELEASE_TABLET | Freq: Every day | ORAL | 3 refills | Status: AC

## 2022-02-15 NOTE — Assessment & Plan Note (Signed)
Per onc  

## 2022-02-15 NOTE — Progress Notes (Signed)
Electrophysiology Office Note:    Date:  02/15/2022   ID:  [name], DOB November 25, 1955, MRN [id number]  PCP:  Carollee Herter, Alferd Apa, DO  [facility] Cardiologist:  Sinclair Grooms, MD  [facility] Electrophysiologist:  Vickie Epley, MD   Referring MD: Carollee Herter, Alferd Apa, *   Chief Complaint: Possible atrial fibrillation  History of Present Illness:    [name] is a 66 y.o. female who presents for an evaluation of possible atrial fibrillation at the request of Dr. Tamala Julian. Their medical history includes hypertension, breast cancer, neutropenia, diabetes, radiation necrosis.  The patient last saw Dr. Tamala Julian January 11, 2022.  The patient's atrial fibrillation was diagnosed after she reported palpitations.  She tells me today that she has had a long history of palpitations and tachycardias that were associated with some of her chemotherapy treatments.  She ultimately was started on anticoagulation for possible atrial fibrillation but this was complicated by bleeding thought to be secondary to radiation necrosis.  She first decreased the Eliquis to once daily but is since stopped it several weeks ago altogether.  She presents today to discuss possible alternatives to anticoagulation for her reported history of atrial fibrillation.  She is with her husband during today's visit.       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+, HER2 negative/  12-11-2015 bilateral mastecotmy w/ reconstruction (no radiation and no chemo)   Cancer of corpus uteri, except isthmus ([place]) 09/2004   oncologist-- dr rossi/ dr gorsuch:/   dx endometroid endometrial & ovarian cancer s/p  chemotheapy and surgery(TAH w/ BSO) :  recurrent 11-19-2014  post pelvic surgery and radiation 01-29-2015 to 03-10-2015   Chronic idiopathic neutropenia ([place])    presumed related to chemotherapy March 2006--- followed by dr Alvy Bimler (treatment w/ G-CSF injections   Chronic nausea    CKD (chronic kidney disease), stage III [facility])    nephrologist-  dr Madelon Lips   Difficult intravenous access    small veins--- hx PICC lines   DM type 2 (diabetes mellitus, type 2) ([place])    endocriologist--  dr Dorothea Ogle [place]   (08-18-2021  per pt checks blood sugar daily in am,  fasting sugar--92--130)   Dysrhythmia    Environmental and seasonal allergies    Generalized muscle weakness    GERD (gastroesophageal reflux disease)    Hiatal hernia    History of abdominal abscess 04/16/2017   post surgery 04-01-2017  --- resolved 10/ 2018   History of COVID-19 05/2021   per pt mild symptoms that resolved   History of gastric polyp    2014  duodenum   History of ileus 04/16/2017   resolved w/ no surgical intervention   History of radiation therapy    01-29-2015 to 03-10-2015  pelvis 50.4Gy   History of small bowel obstruction 01/2019   Hypothyroidism    monitored by dr Legrand Como altheimer   Ileostomy in place [facility]) 04/01/2017   created at same time colostomy takedown./  permnant 09/ 2020   Mixed dyslipidemia    Multiple thyroid nodules    Managed by Dr. Harlow Asa   PAF (paroxysmal atrial fibrillation) [facility])    cardiologist--- dr h. Tamala Julian;  event monitor 05/  2021 in epic, NSR with 1% burden Afib, first degree heart blockl and PACs;  echo 04/ 2021 ef 55-60%, mild LVH   Peripheral neuropathy    PONV (postoperative nausea and vomiting)    "scopolamine patch works for me"   Presence of urostomy ([place]) 04/2019   Radiation-induced dermatitis    contact dermatitis , radiation completed, rash only on ankles now.   Vitamin D deficiency    Vulval lesion    Wears glasses     Past Surgical History:  Procedure Laterality Date   APPENDECTOMY     BREAST RECONSTRUCTION  WITH PLACEMENT OF TISSUE EXPANDER AND FLEX HD (ACELLULAR HYDRATED DERMIS) Bilateral 12/11/2015   Procedure: BILATERAL BREAST RECONSTRUCTION WITH PLACEMENT OF TISSUE EXPANDERS;  Surgeon: Irene Limbo, MD;  Location: [facility];  Service: Plastics;  Laterality: Bilateral;   COLONOSCOPY WITH PROPOFOL N/A 08/21/2013   Procedure: COLONOSCOPY WITH PROPOFOL;  Surgeon: Cleotis Nipper, MD;  [facility];  Service: Endoscopy;  Laterality: N/A;   COLOSTOMY TAKEDOWN N/A 12/04/2014   Procedure: LAPROSCOPIC LYSIS OF ADHESIONS, SPLENIC MOBILIZATION, RELOCATION OF COLOSTOMY, DEBRIDEMENT INITIAL COLOSTOMY SITE;  Surgeon: Michael [name], MD;  [facility];  Service: General;  Laterality: N/A;   CYSTECTOMY W/ URETEROILEAL CONDUIT  04/12/2019   _0 ;   INTESTINAL ANASTOMOSIS, PANNICULECTOMY, PELVIS EXENTENTION,  PARTIAL COLECTOMY REMOVAL ILEIUM WITH PERMNANT ILEOCOLOSTOMY   CYSTOGRAM N/A 06/01/2017   Procedure: CYSTOGRAM;  Surgeon: Alexis Frock, MD;  [facility];  Service: Urology;  Laterality: N/A;   CYSTOSCOPY W/ RETROGRADES Right 11/21/2015   Procedure: CYSTOSCOPY WITH RETROGRADE PYELOGRAM;  Surgeon: Alexis Frock, MD;  [facility];  Service: Urology;  Laterality: Right;   CYSTOSCOPY W/ URETERAL STENT PLACEMENT Right 11/21/2015   Procedure: CYSTOSCOPY WITH STENT REPLACEMENT;  Surgeon: Alexis Frock, MD;  [facility];  Service: Urology;  Laterality: Right;   CYSTOSCOPY W/ URETERAL STENT PLACEMENT Right 03/10/2016   Procedure: CYSTOSCOPY WITH STENT REPLACEMENT;  Surgeon: Alexis Frock, MD;  Location: [facility];  Service: Urology;  Laterality: Right;   CYSTOSCOPY W/ URETERAL STENT PLACEMENT Right 06/30/2016   Procedure: CYSTOSCOPY WITH RETROGRADE PYELOGRAM/URETERAL STENT EXCHANGE;  Surgeon: Alexis Frock, MD;  Location: [facility];  Service: Urology;  Laterality: Right;   CYSTOSCOPY W/ URETERAL STENT PLACEMENT N/A 06/01/2017   Procedure: CYSTOSCOPY  WITH EXAM UNDER ANESTHESIA;  Surgeon: Alexis Frock, MD;  [facility];  Service: Urology;  Laterality: N/A;   CYSTOSCOPY W/ URETERAL STENT PLACEMENT Right 08/17/2017   Procedure: CYSTOSCOPY WITH RETROGRADE PYELOGRAM/URETERAL STENT REMOVAL;  Surgeon: Alexis Frock, MD;  Location: [facility];  Service: Urology;  Laterality: Right;   CYSTOSCOPY WITH RETROGRADE PYELOGRAM, URETEROSCOPY AND STENT PLACEMENT Right 03/20/2015   Procedure: CYSTOSCOPY WITH RETROGRADE PYELOGRAM, URETEROSCOPY WITH BALLOON DILATION AND STENT PLACEMENT ON RIGHT;  Surgeon: Alexis Frock, MD;  Location: [facility];  Service: Urology;  Laterality: Right;   CYSTOSCOPY WITH RETROGRADE PYELOGRAM, URETEROSCOPY AND STENT PLACEMENT Right 05/02/2015   Procedure: CYSTOSCOPY WITH RIGHT RETROGRADE PYELOGRAM,  DIAGNOSTIC URETEROSCOPY AND STENT PULL ;  Surgeon: Alexis Frock, MD;  Location: [facility];  Service: Urology;  Laterality: Right;   CYSTOSCOPY WITH RETROGRADE PYELOGRAM, URETEROSCOPY AND STENT PLACEMENT Right 09/05/2015   Procedure: CYSTOSCOPY WITH RETROGRADE PYELOGRAM,  AND STENT PLACEMENT;  Surgeon: Alexis Frock, MD;  [facility];  Service: Urology;  Laterality: Right;   CYSTOSCOPY WITH RETROGRADE PYELOGRAM, URETEROSCOPY AND STENT PLACEMENT Right 04/01/2017   Procedure: CYSTOSCOPY WITH RETROGRADE PYELOGRAM, URETEROSCOPY  AND STENT PLACEMENT;  Surgeon: Alexis Frock, MD;  [facility];  Service: Urology;  Laterality: Right;   CYSTOSCOPY WITH STENT PLACEMENT Right 10/27/2016   Procedure: CYSTOSCOPY WITH STENT CHANGE and right retrograde pyelogram;  Surgeon: Alexis Frock, MD;  Location: [facility];  Service: Urology;  Laterality: Right;   EUS N/A 10/02/2014   Procedure: LOWER ENDOSCOPIC ULTRASOUND (EUS);  Surgeon: Arta Silence, MD;  Location: [facility];  Service: Endoscopy;  Laterality: N/A;   EXCISION SOFT TISSUE MASS RIGHT FOREMAN  12/08/2006    EYE SURGERY  as child   pytosis of eyelids repair   INCISION AND DRAINAGE OF WOUND Bilateral 12/26/2015   Procedure: DEBRIDEMENT OF BILATERAL MASTECTOMY FLAPS;  Surgeon: Irene Limbo, MD;  Location: [place];  Service: Plastics;  Laterality: Bilateral;   IR CV LINE INJECTION  05/31/2017   IR FLUORO GUIDE CV LINE LEFT  05/31/2017   IR FLUORO GUIDE CV LINE RIGHT  04/06/2017   IR FLUORO GUIDE CV MIDLINE PICC RIGHT  05/30/2017   IR NEPHROSTOGRAM LEFT INITIAL PLACEMENT  09/02/2017   IR NEPHROSTOGRAM LEFT THRU EXISTING ACCESS  11/29/2017   IR NEPHROSTOGRAM RIGHT INITIAL PLACEMENT  09/02/2017   IR NEPHROSTOGRAM RIGHT THRU EXISTING ACCESS  09/13/2017   IR NEPHROSTOGRAM RIGHT THRU EXISTING ACCESS  11/29/2017   IR NEPHROSTOMY EXCHANGE LEFT  11/28/2017   IR NEPHROSTOMY EXCHANGE LEFT  01/05/2018   IR NEPHROSTOMY EXCHANGE LEFT  02/16/2018   IR NEPHROSTOMY EXCHANGE LEFT  03/30/2018   IR NEPHROSTOMY EXCHANGE LEFT  05/12/2018   IR NEPHROSTOMY EXCHANGE LEFT  06/21/2018   IR NEPHROSTOMY EXCHANGE LEFT  08/04/2018   IR NEPHROSTOMY EXCHANGE LEFT  09/18/2018   IR NEPHROSTOMY EXCHANGE LEFT  10/09/2018   IR NEPHROSTOMY EXCHANGE LEFT  10/27/2018   IR NEPHROSTOMY EXCHANGE LEFT  11/21/2018   IR NEPHROSTOMY EXCHANGE LEFT  01/05/2019   IR NEPHROSTOMY EXCHANGE LEFT  02/15/2019   IR NEPHROSTOMY EXCHANGE LEFT  03/29/2019   IR NEPHROSTOMY EXCHANGE RIGHT  10/02/2017   IR NEPHROSTOMY EXCHANGE RIGHT  11/28/2017   IR NEPHROSTOMY EXCHANGE RIGHT  01/05/2018   IR NEPHROSTOMY EXCHANGE RIGHT  02/16/2018   IR NEPHROSTOMY EXCHANGE RIGHT  03/30/2018   IR NEPHROSTOMY EXCHANGE RIGHT  05/12/2018   IR NEPHROSTOMY EXCHANGE RIGHT  06/21/2018   IR NEPHROSTOMY EXCHANGE RIGHT  08/04/2018   IR NEPHROSTOMY EXCHANGE RIGHT  09/18/2018   IR NEPHROSTOMY EXCHANGE RIGHT  10/27/2018   IR NEPHROSTOMY EXCHANGE RIGHT  11/21/2018   IR NEPHROSTOMY EXCHANGE RIGHT  01/05/2019   IR NEPHROSTOMY EXCHANGE RIGHT  02/15/2019   IR  NEPHROSTOMY EXCHANGE RIGHT  03/29/2019   IR NEPHROSTOMY PLACEMENT LEFT  10/02/2017   IR RADIOLOGIST EVAL & MGMT  05/03/2017   IR US GUIDE VASC ACCESS LEFT  05/31/2017   IR US GUIDE VASC ACCESS RIGHT  04/06/2017   IR US GUIDE VASC ACCESS RIGHT  05/30/2017   LAPAROSCOPIC CHOLECYSTECTOMY  1990   LIPOSUCTION WITH LIPOFILLING Bilateral 04/16/2016   Procedure: LIPOSUCTION WITH LIPOFILLING TO BILATERAL CHEST;  Surgeon: Irene Limbo, MD;  Location: [place];  Service: Plastics;  Laterality: Bilateral;   MASTECTOMY W/ SENTINEL NODE BIOPSY Bilateral 12/11/2015   Procedure: RIGHT PROPHYLACTIC MASTECTOMY, LEFT TOTAL MASTECTOMY WITH LEFT AXILLARY SENTINEL LYMPH NODE BIOPSY;  Surgeon: Stark Klein, MD;  Location: [place];  Service: General;  Laterality: Bilateral;   OSTOMY N/A 11/19/2014   Procedure: OSTOMY;  Surgeon: Michael [name], MD;  [facility];  Service: General;  Laterality:  N/A;   PROCTOSCOPY N/A 04/01/2017   Procedure: RIDGE PROCTOSCOPY;  Surgeon: Michael [name], MD;  [facility];  Service: General;  Laterality: N/A;   REMOVAL OF BILATERAL TISSUE EXPANDERS WITH PLACEMENT OF BILATERAL BREAST IMPLANTS Bilateral 04/16/2016   Procedure: REMOVAL OF BILATERAL TISSUE EXPANDERS WITH PLACEMENT OF BILATERAL BREAST IMPLANTS;  Surgeon: Irene Limbo, MD;  Location: [place];  Service: Plastics;  Laterality: Bilateral;   ROBOTIC ASSISTED LAP VAGINAL HYSTERECTOMY N/A 11/19/2014   Procedure: ROBOTIC LYSIS OF ADHESIONS, CONVERTED TO LAPAROTOMY RADICAL UPPER VAGINECTOMY,LOW ANTERIOR BOWEL RESECTION, COLOSTOMY, BILATERAL URETERAL STENT PLACEMENT AND CYSTONOMY CLOSURE;  Surgeon: Everitt Amber, MD;  [facility];  Service: Gynecology;  Laterality: N/A;   TISSUE EXPANDER FILLING Bilateral 12/26/2015   Procedure: EXPANSION OF BILATERAL CHEST TISSUE EXPANDERS (60 mL- Right; 75 mL- Left);  Surgeon: Irene Limbo, MD;  Location: [place];  Service: Plastics;   Laterality: Bilateral;   TONSILLECTOMY     TOTAL ABDOMINAL HYSTERECTOMY  March 2006   [facility]   and Bilateral Salpingoophorectomy/  staging for Ovarian cancer/  an   VULVAR LESION REMOVAL N/A 08/19/2021   Procedure: VULVAR LESION with biopsies;  Surgeon: Lafonda Mosses, MD;  Location: [facility];  Service: Gynecology;  Laterality: N/A;   XI ROBOTIC ASSISTED LOWER ANTERIOR RESECTION N/A 04/01/2017   Procedure: XI ROBOTIC VS LAPAROSCOPIC COLOSTOMY TAKEDOWN WITH LYSIS OF ADHESIONS.;  Surgeon: Michael [name], MD;  [facility];  Service: General;  Laterality: N/A;  ERAS PATHWAY    Current Medications: Current Meds  Medication Sig   acetaminophen (TYLENOL) 500 MG tablet Take 500 mg by mouth every 6 (six) hours as needed.   aspirin EC 81 MG tablet Take 1 tablet (81 mg total) by mouth daily. Swallow whole.   Biotin 5 MG TABS Take 5 mg by mouth daily.    buPROPion (WELLBUTRIN XL) 150 MG 24 hr tablet Take 150 mg by mouth daily.   calcium citrate-vitamin D (CITRACAL+D) 315-200 MG-UNIT tablet Take 1 tablet by mouth daily.   Cholecalciferol (VITAMIN D3) 10000 UNITS capsule Take 10,000 Units by mouth See admin instructions. Take 1 tablet by mouth 43times per week.   clobetasol (OLUX) 0.05 % topical foam Apply topically 2 (two) times daily.   diphenhydrAMINE (BENADRYL) 25 mg capsule Take 1 capsule (25 mg total) by mouth every 8 (eight) hours as needed for itching, allergies or sleep.   doxycycline (VIBRA-TABS) 100 MG tablet Take 1 tablet (100 mg total) by mouth 2 (two) times daily.   famotidine-calcium carbonate-magnesium hydroxide (PEPCID COMPLETE) 10-800-165 MG chewable tablet Chew 1 tablet by mouth as needed.   ferrous sulfate 325 (65 FE) MG tablet Take 1 tablet (325 mg total) by mouth at bedtime.   fluticasone (FLONASE) 50 MCG/ACT nasal spray SHAKE LIQUID AND USE 2 SPRAYS IN EACH NOSTRIL DAILY   HYDROcodone bit-homatropine (HYCODAN) 5-1.5 MG/5ML syrup Take 5 mLs by mouth every 6  (six) hours as needed for cough.   levothyroxine (SYNTHROID, LEVOTHROID) 150 MCG tablet Take 1 tablet (150 mcg total) by mouth daily before breakfast.   loratadine (CLARITIN) 10 MG tablet Take 10 mg by mouth daily.    Melatonin 10 MG TABS Take 1 tablet by mouth at bedtime.   omega-3 acid ethyl esters (LOVAZA) 1 G capsule Take 1 g by mouth 2 (two) times daily.    Polyethyl Glycol-Propyl Glycol (SYSTANE OP) Place 1 drop into both eyes daily as needed (dry eyes).    pregabalin (LYRICA)  50 MG capsule TAKE 1 CAPSULE TWICE A DAY   Prenatal Vit-Fe Fumarate-FA (PRENATAL VITAMIN PO) Take 1 capsule by mouth daily. Takes prenatal because there are no dyes in it   Probiotic Product (ALIGN PO) Take 1 capsule by mouth daily.   rosuvastatin (CRESTOR) 10 MG tablet Take 10 mg by mouth every evening.   Saccharomyces boulardii (FLORASTOR PO) Take 1 capsule by mouth at bedtime.   Tazarotene (ARAZLO) 0.045 % LOTN Apply 1 application topically daily.   Tbo-Filgrastim (GRANIX) 480 MCG/0.8ML SOSY injection 480 mcg subcutaneous injection every 6 days life long   tirzepatide (MOUNJARO) 10 MG/0.5ML Pen Inject 10 mg into the skin once a week.   tiZANidine (ZANAFLEX) 4 MG tablet Take 0.5-1 tablets (2-4 mg total) by mouth every 8 (eight) hours as needed for muscle spasms.   [DISCONTINUED] Apixaban (ELIQUIS PO) Take 5 mg by mouth daily. Pt takes 1tablet 47m once a day.     Allergies:   Penicillins, Cefaclor, Erythromycin, Tape, Trimethoprim, Ultram [tramadol], Zarxio [filgrastim], Cephalosporins, Fluconazole, Neomycin, Oxycodone, Pectin, Septra [sulfamethoxazole-trimethoprim], and Sulfa antibiotics   Social History   Socioeconomic History   Marital status: Married    Spouse name: [name]  Number of children: 1   Years of education: Not on file   Highest education level: Not on file  Occupational History   Occupation: retired RTherapist, [employer]- retired  Tobacco Use   Smoking status: Never    Smokeless tobacco: Never  Vaping Use   Vaping Use: Never used  Substance and Sexual Activity   Alcohol use: Not Currently   Drug use: No   Sexual activity: Not on file  Other Topics Concern   Not on file  Social History Narrative   Exercise-- has not gotten back into it since cancer came back   Social Determinants of Health   Financial Resource Strain: Not on file  Food Insecurity: Not on file  Transportation Needs: Not on file  Physical Activity: Not on file  Stress: Not on file  Social Connections: Not on file     Family History: The patient's family history includes Brain cancer in her paternal grandfather; Breast cancer in her maternal aunt; Cancer (age of onset: 567 in her mother; Cancer (age of onset: 753 in her father; Diabetes in her brother, father, and sister; Heart disease in her brother and father; Hyperlipidemia in her father; Hypertension in her father and mother; Hypertension (age of onset: y-10) in her brother; Lymphoma in her paternal aunt; Obesity in her father; Ovarian cancer in an other family member.  ROS:   Please see the history of present illness.    All other systems reviewed and are negative.  EKGs/Labs/Other Studies Reviewed:    The following studies were reviewed today:  January 01, 2020 Preventice monitor personally reviewed Reviewed Preventice with the patient and her husband during today's visit.  There was reported 56 minutes of atrial fibrillation during this monitor.  Personal review of the episodes of atrial fibrillation suggest they may in fact be sinus rhythm with frequent PACs.  There are a few episodes that I cannot completely rule out his atrial fibrillation but near these episodes are more clear P waves with a reliable PR interval.  EKG:  The ekg ordered today demonstrates sinus rhythm with a first-degree AV delay   Recent Labs: 02/15/2022: ALT 21; BUN 25; Creatinine, Ser 1.74; Hemoglobin 11.1; Platelets 217.0; Potassium 3.9; Sodium 139; TSH  2.53  Recent Lipid Panel    Component Value Date/Time   CHOL 77 02/15/2022 1020   CHOL 92 (L) 06/16/2020 0834   TRIG 82.0 02/15/2022 1020   HDL 44.60 02/15/2022 1020   HDL 49 06/16/2020 0834   CHOLHDL 2 02/15/2022 1020   VLDL 16.4 02/15/2022 1020   LDLCALC 16 02/15/2022 1020   LDLCALC 21 06/16/2020 0834   LDLCALC 13 01/18/2020 1440    Physical Exam:    VS:  BP 129/76   Pulse 61   Ht 5' 2" (1.575 m)   Wt 223 lb 3.2 oz (101.2 kg)   SpO2 99%   BMI 40.82 kg/m     Wt Readings from Last 3 Encounters:  02/15/22 223 lb 3.2 oz (101.2 kg)  02/15/22 221 lb (100.2 kg)  01/28/22 213 lb (96.6 kg)     GEN:  Well nourished, well developed in no acute distress.  Obese HEENT: Normal NECK: No JVD; No carotid bruits LYMPHATICS: No lymphadenopathy CARDIAC: RRR, no murmurs, rubs, gallops RESPIRATORY:  Clear to auscultation without rales, wheezing or rhonchi  ABDOMEN: Soft, non-tender, non-distended MUSCULOSKELETAL:  No edema; No deformity  SKIN: Warm and dry NEUROLOGIC:  Alert and oriented x 3 PSYCHIATRIC:  Normal affect       ASSESSMENT:    1. Tachycardia   2. First degree AV block    PLAN:    In order of problems listed above:   #Tachycardia #Palpitations I do not have any clear evidence of atrial fibrillation on her previous heart monitor.  I cannot completely exclude very brief salvos of atrial fibrillation but there are clearly no sustained episodes of atrial fibrillation.  Given her history of anticoagulation and intolerance secondary to radiation necrosis, I would favor stopping the Eliquis as she has already done.  We discussed stroke risk if she were in fact to have atrial fibrillation off of an anticoagulant during today's visit.  They understand that this is a risk and benefits balance that favor staying off the blood thinner for now.  We discussed using a atrial fibrillation surveillance strategy moving forward to look for any episodes of sustained atrial fibrillation  that would necessitate [place] revisiting the question of anticoagulation.  Given her extensive abdominal surgery history and radiation exposure, I do not think she will be a candidate for watchman.  We discussed using a loop recorder, wearable/watch heart monitor and [place] during today's clinic appointment.  After discussing the 3 options in great detail, the patient would like to proceed with a loop recorder implant which I think is a very reasonable strategy.  I did discuss the loop recorder monitor implant and the associated monitoring cost during today's clinic appointment and they wish to proceed.  We will get this scheduled for her.  Stop Eliquis.  Start aspirin 81 mg by mouth once daily.  Follow-up for loop recorder implant      Total time spent with patient today 60 minutes. This includes reviewing records, evaluating the patient and coordinating care.  Medication Adjustments/Labs and Tests Ordered: Current medicines are reviewed at length with the patient today.  Concerns regarding medicines are outlined above.  Orders Placed This Encounter  Procedures   EKG 12-Lead   Meds ordered this encounter  Medications   aspirin EC 81 MG tablet    Sig: Take 1 tablet (81 mg total) by mouth daily. Swallow whole.    Dispense:  90 tablet    Refill:  3     Signed, Lysbeth Galas T.  Quentin Ore, MD, [facility] 02/15/2022 10:12 PM    Electrophysiology [facility]

## 2022-02-15 NOTE — Assessment & Plan Note (Signed)
On mounjaro Check labs

## 2022-02-15 NOTE — Patient Instructions (Signed)
Preventive Care 65 Years and Older, Female Preventive care refers to lifestyle choices and visits with your health care provider that can promote health and wellness. Preventive care visits are also called wellness exams. What can I expect for my preventive care visit? Counseling Your health care provider may ask you questions about your: Medical history, including: Past medical problems. Family medical history. Pregnancy and menstrual history. History of falls. Current health, including: Memory and ability to understand (cognition). Emotional well-being. Home life and relationship well-being. Sexual activity and sexual health. Lifestyle, including: Alcohol, nicotine or tobacco, and drug use. Access to firearms. Diet, exercise, and sleep habits. Work and work environment. Sunscreen use. Safety issues such as seatbelt and bike helmet use. Physical exam Your health care provider will check your: Height and weight. These may be used to calculate your BMI (body mass index). BMI is a measurement that tells if you are at a healthy weight. Waist circumference. This measures the distance around your waistline. This measurement also tells if you are at a healthy weight and may help predict your risk of certain diseases, such as type 2 diabetes and high blood pressure. Heart rate and blood pressure. Body temperature. Skin for abnormal spots. What immunizations do I need?  Vaccines are usually given at various ages, according to a schedule. Your health care provider will recommend vaccines for you based on your age, medical history, and lifestyle or other factors, such as travel or where you work. What tests do I need? Screening Your health care provider may recommend screening tests for certain conditions. This may include: Lipid and cholesterol levels. Hepatitis C test. Hepatitis B test. HIV (human immunodeficiency virus) test. STI (sexually transmitted infection) testing, if you are at  risk. Lung cancer screening. Colorectal cancer screening. Diabetes screening. This is done by checking your blood sugar (glucose) after you have not eaten for a while (fasting). Mammogram. Talk with your health care provider about how often you should have regular mammograms. BRCA-related cancer screening. This may be done if you have a family history of breast, ovarian, tubal, or peritoneal cancers. Bone density scan. This is done to screen for osteoporosis. Talk with your health care provider about your test results, treatment options, and if necessary, the need for more tests. Follow these instructions at home: Eating and drinking  Eat a diet that includes fresh fruits and vegetables, whole grains, lean protein, and low-fat dairy products. Limit your intake of foods with high amounts of sugar, saturated fats, and salt. Take vitamin and mineral supplements as recommended by your health care provider. Do not drink alcohol if your health care provider tells you not to drink. If you drink alcohol: Limit how much you have to 0-1 drink a day. Know how much alcohol is in your drink. In the [place] one drink equals one 12 oz bottle of beer (355 mL), one 5 oz glass of wine (148 mL), or one 1 oz glass of hard liquor (44 mL). Lifestyle Brush your teeth every morning and night with fluoride toothpaste. Floss one time each day. Exercise for at least 30 minutes 5 or more days each week. Do not use any products that contain nicotine or tobacco. These products include cigarettes, chewing tobacco, and vaping devices, such as e-cigarettes. If you need help quitting, ask your health care provider. Do not use drugs. If you are sexually active, practice safe sex. Use a condom or other form of protection in order to prevent STIs. Take aspirin only as told by   your health care provider. Make sure that you understand how much to take and what form to take. Work with your health care provider to find out whether it  is safe and beneficial for you to take aspirin daily. Ask your health care provider if you need to take a cholesterol-lowering medicine (statin). Find healthy ways to manage stress, such as: Meditation, yoga, or listening to music. Journaling. Talking to a trusted person. Spending time with friends and family. Minimize exposure to UV radiation to reduce your risk of skin cancer. Safety Always wear your seat belt while driving or riding in a vehicle. Do not drive: If you have been drinking alcohol. Do not ride with someone who has been drinking. When you are tired or distracted. While texting. If you have been using any mind-altering substances or drugs. Wear a helmet and other protective equipment during sports activities. If you have firearms in your house, make sure you follow all gun safety procedures. What's next? Visit your health care provider once a year for an annual wellness visit. Ask your health care provider how often you should have your eyes and teeth checked. Stay up to date on all vaccines. This information is not intended to replace advice given to you by your health care provider. Make sure you discuss any questions you have with your health care provider. Document Revised: 01/14/2021 Document Reviewed: 01/14/2021 Elsevier Patient Education  2023 [employer]  

## 2022-02-15 NOTE — Patient Instructions (Signed)
Medication Instructions:  Stop Eliquis Start Aspirin 81 mg daily  Your physician recommends that you continue on your current medications as directed. Please refer to the Current Medication list given to you today. *If you need a refill on your cardiac medications before your next appointment, please call your pharmacy*  Lab Work: None. If you have labs (blood work) drawn today and your tests are completely normal, you will receive your results only by: [place] (if you have MyChart) OR A paper copy in the mail If you have any lab test that is abnormal or we need to change your treatment, we will call you to review the results.  Testing/Procedures: None.  Follow-Up: At [facility], you and your health needs are our priority.  As part of our continuing mission to provide you with exceptional heart care, we have created designated Provider Care Teams.  These Care Teams include your primary Cardiologist (physician) and Advanced Practice Providers (APPs -  Physician Assistants and Nurse Practitioners) who all work together to provide you with the care you need, when you need it.  Your physician wants you to follow-up in: Next available with Dr. Quentin Ore for an in office  ([facility]) Loop recorder implant.   We recommend signing up for the patient portal called "MyChart".  Sign up information is provided on this After Visit Summary.  MyChart is used to connect with patients for Virtual Visits (Telemedicine).  Patients are able to view lab/test results, encounter notes, upcoming appointments, etc.  Non-urgent messages can be sent to your provider as well.   To learn more about what you can do with MyChart, go to [email or web address].    Any Other Special Instructions Will Be Listed Below (If Applicable).

## 2022-02-15 NOTE — Assessment & Plan Note (Signed)
Well controlled, no changes to meds. Encouraged heart healthy diet such as the DASH diet and exercise as tolerated.  °

## 2022-02-15 NOTE — Assessment & Plan Note (Signed)
Check labs today.

## 2022-02-15 NOTE — Assessment & Plan Note (Signed)
Per [facility]

## 2022-02-15 NOTE — Progress Notes (Addendum)
Subjective:   By signing my name below, I, [name], attest that this documentation has been prepared under the direction and in the presence of [name] Held DO 02/15/2022    Patient ID: [name], female    DOB: 10/18/1955, 66 y.o.   MRN: [id number]  Chief Complaint  Patient presents with   Annual Exam    Pt states fasting     HPI Patient is in today for a comprehensive physical exam  She states that her Eliquis she is on is negatively impacting her day of life. She states that when she was previously on Doxycycline her bleeding symptoms improved. She is wondering if there are any medications that are similar to Doxycycline to help her bleeding symptoms.   She complains of dysuria. She is not sure whether her dysuria is due to radiation or an infection. She states that she has trouble hydrating.   She reports that she has had two events of bowel obstruction. She tries to alleviate symptoms herself.   She states that her oncologist moved locations. She is now regularly seeing a new oncologist at [facility].   She denies having any fever, new muscle pain, joint pain , new moles, congestion, sinus pain, sore throat, chest pain, palpations, cough, SOB ,wheezing,n/v/d constipation, blood in stool, frequency, hematuria, at this time  She reports that her father passed away on 11/27/2021.  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+, HER2 negative/  12-11-2015 bilateral mastecotmy w/ reconstruction (no radiation and no chemo)   Cancer of corpus uteri, except isthmus ([place]) 09/2004   oncologist--  dr rossi/ dr gorsuch:/   dx endometroid endometrial & ovarian cancer s/p  chemotheapy and surgery(TAH w/ BSO) :  recurrent 11-19-2014 post pelvic surgery and radiation 01-29-2015 to 03-10-2015   Chronic idiopathic neutropenia ([place])    presumed related to chemotherapy March 2006--- followed by dr Alvy Bimler (treatment w/ G-CSF injections   Chronic nausea    CKD (chronic kidney disease), stage III [facility])    nephrologist-  dr Madelon Lips   Difficult intravenous access    small veins--- hx PICC lines   DM type 2 (diabetes mellitus, type 2) ([place])    endocriologist--  dr Dorothea Ogle [place]   (08-18-2021  per pt checks blood sugar daily in am,  fasting sugar--92--130)   Dysrhythmia    Environmental and seasonal allergies    Generalized muscle weakness    GERD (gastroesophageal reflux disease)    Hiatal hernia    History of abdominal abscess 04/16/2017   post surgery 04-01-2017  --- resolved 10/ 2018   History of COVID-19 05/2021   per pt mild symptoms that resolved   History of gastric polyp    2014  duodenum   History of ileus 04/16/2017   resolved w/ no surgical intervention   History of radiation therapy    01-29-2015 to 03-10-2015  pelvis 50.4Gy   History of small bowel obstruction 01/2019   Hypothyroidism    monitored by dr Legrand Como altheimer   Ileostomy in place [facility]) 04/01/2017   created at same time colostomy takedown./  permnant 09/  2020   Mixed dyslipidemia    Multiple thyroid nodules    Managed by Dr. Harlow Asa   PAF (paroxysmal atrial fibrillation) [facility])    cardiologist--- dr h. Tamala Julian;  event monitor 05/ 2021 in epic, NSR with 1% burden Afib, first degree heart blockl and PACs;  echo 04/ 2021 ef 55-60%, mild LVH   Peripheral neuropathy    PONV (postoperative nausea and vomiting)    "scopolamine patch works for me"   Presence of urostomy ([place]) 04/2019   Radiation-induced dermatitis    contact dermatitis , radiation completed, rash only on ankles now.   Vitamin D deficiency     Vulval lesion    Wears glasses     Past Surgical History:  Procedure Laterality Date   APPENDECTOMY     BREAST RECONSTRUCTION WITH PLACEMENT OF TISSUE EXPANDER AND FLEX HD (ACELLULAR HYDRATED DERMIS) Bilateral 12/11/2015   Procedure: BILATERAL BREAST RECONSTRUCTION WITH PLACEMENT OF TISSUE EXPANDERS;  Surgeon: Irene Limbo, MD;  Location: [place];  Service: Plastics;  Laterality: Bilateral;   COLONOSCOPY WITH PROPOFOL N/A 08/21/2013   Procedure: COLONOSCOPY WITH PROPOFOL;  Surgeon: Cleotis Nipper, MD;  [facility];  Service: Endoscopy;  Laterality: N/A;   COLOSTOMY TAKEDOWN N/A 12/04/2014   Procedure: LAPROSCOPIC LYSIS OF ADHESIONS, SPLENIC MOBILIZATION, RELOCATION OF COLOSTOMY, DEBRIDEMENT INITIAL COLOSTOMY SITE;  Surgeon: Michael [name], MD;  [facility];  Service: General;  Laterality: N/A;   CYSTECTOMY W/ URETEROILEAL CONDUIT  04/12/2019   _0 ;   INTESTINAL ANASTOMOSIS, PANNICULECTOMY, PELVIS EXENTENTION,  PARTIAL COLECTOMY REMOVAL ILEIUM WITH PERMNANT ILEOCOLOSTOMY   CYSTOGRAM N/A 06/01/2017   Procedure: CYSTOGRAM;  Surgeon: Alexis Frock, MD;  [facility];  Service: Urology;  Laterality: N/A;   CYSTOSCOPY W/ RETROGRADES Right 11/21/2015   Procedure: CYSTOSCOPY WITH RETROGRADE PYELOGRAM;  Surgeon: Alexis Frock, MD;  [facility];  Service: Urology;  Laterality: Right;   CYSTOSCOPY W/ URETERAL STENT PLACEMENT Right 11/21/2015   Procedure: CYSTOSCOPY WITH STENT REPLACEMENT;  Surgeon: Alexis Frock, MD;  [facility];  Service: Urology;  Laterality: Right;   CYSTOSCOPY W/ URETERAL STENT PLACEMENT Right 03/10/2016   Procedure: CYSTOSCOPY WITH STENT REPLACEMENT;  Surgeon: Alexis Frock, MD;  Location: [facility];  Service: Urology;  Laterality: Right;   CYSTOSCOPY W/ URETERAL STENT PLACEMENT Right 06/30/2016   Procedure: CYSTOSCOPY WITH RETROGRADE PYELOGRAM/URETERAL STENT EXCHANGE;  Surgeon: Alexis Frock, MD;  Location: [facility];  Service: Urology;  Laterality: Right;   CYSTOSCOPY W/ URETERAL STENT PLACEMENT N/A 06/01/2017   Procedure: CYSTOSCOPY WITH EXAM UNDER ANESTHESIA;  Surgeon: Alexis Frock, MD;  [facility];  Service: Urology;  Laterality: N/A;   CYSTOSCOPY W/ URETERAL STENT PLACEMENT Right 08/17/2017   Procedure: CYSTOSCOPY WITH RETROGRADE PYELOGRAM/URETERAL STENT REMOVAL;  Surgeon: Alexis Frock, MD;  Location: [facility];  Service: Urology;  Laterality: Right;   CYSTOSCOPY WITH RETROGRADE PYELOGRAM, URETEROSCOPY AND STENT PLACEMENT Right 03/20/2015   Procedure: CYSTOSCOPY WITH RETROGRADE PYELOGRAM, URETEROSCOPY WITH BALLOON DILATION AND STENT PLACEMENT ON RIGHT;  Surgeon: Alexis Frock, MD;  Location[facility];  Service: Urology;  Laterality: Right;   CYSTOSCOPY WITH RETROGRADE PYELOGRAM, URETEROSCOPY AND STENT PLACEMENT Right 05/02/2015   Procedure: CYSTOSCOPY WITH RIGHT RETROGRADE PYELOGRAM,  DIAGNOSTIC URETEROSCOPY AND STENT PULL ;  Surgeon: Alexis Frock, MD;  Location: [facility];  Service: Urology;  Laterality: Right;   CYSTOSCOPY WITH RETROGRADE PYELOGRAM, URETEROSCOPY AND STENT PLACEMENT Right 09/05/2015   Procedure: CYSTOSCOPY WITH RETROGRADE PYELOGRAM,  AND STENT  PLACEMENT;  Surgeon: Alexis Frock, MD;  [facility];  Service: Urology;  Laterality: Right;   CYSTOSCOPY WITH RETROGRADE PYELOGRAM, URETEROSCOPY AND STENT PLACEMENT Right 04/01/2017   Procedure: CYSTOSCOPY WITH RETROGRADE PYELOGRAM, URETEROSCOPY AND STENT PLACEMENT;  Surgeon: Alexis Frock, MD;  [facility];  Service: Urology;  Laterality: Right;   CYSTOSCOPY WITH STENT PLACEMENT Right 10/27/2016   Procedure: CYSTOSCOPY WITH STENT CHANGE and right retrograde pyelogram;  Surgeon: Alexis Frock, MD;  Location: [facility];  Service: Urology;  Laterality: Right;   EUS N/A 10/02/2014   Procedure: LOWER ENDOSCOPIC ULTRASOUND (EUS);  Surgeon:  Arta Silence, MD;  Location: [facility];  Service: Endoscopy;  Laterality: N/A;   EXCISION SOFT TISSUE MASS RIGHT FOREMAN  12/08/2006   EYE SURGERY  as child   pytosis of eyelids repair   INCISION AND DRAINAGE OF WOUND Bilateral 12/26/2015   Procedure: DEBRIDEMENT OF BILATERAL MASTECTOMY FLAPS;  Surgeon: Irene Limbo, MD;  Location: [place];  Service: Plastics;  Laterality: Bilateral;   IR CV LINE INJECTION  05/31/2017   IR FLUORO GUIDE CV LINE LEFT  05/31/2017   IR FLUORO GUIDE CV LINE RIGHT  04/06/2017   IR FLUORO GUIDE CV MIDLINE PICC RIGHT  05/30/2017   IR NEPHROSTOGRAM LEFT INITIAL PLACEMENT  09/02/2017   IR NEPHROSTOGRAM LEFT THRU EXISTING ACCESS  11/29/2017   IR NEPHROSTOGRAM RIGHT INITIAL PLACEMENT  09/02/2017   IR NEPHROSTOGRAM RIGHT THRU EXISTING ACCESS  09/13/2017   IR NEPHROSTOGRAM RIGHT THRU EXISTING ACCESS  11/29/2017   IR NEPHROSTOMY EXCHANGE LEFT  11/28/2017   IR NEPHROSTOMY EXCHANGE LEFT  01/05/2018   IR NEPHROSTOMY EXCHANGE LEFT  02/16/2018   IR NEPHROSTOMY EXCHANGE LEFT  03/30/2018   IR NEPHROSTOMY EXCHANGE LEFT  05/12/2018   IR NEPHROSTOMY EXCHANGE LEFT  06/21/2018   IR NEPHROSTOMY EXCHANGE LEFT  08/04/2018   IR NEPHROSTOMY EXCHANGE LEFT  09/18/2018   IR NEPHROSTOMY EXCHANGE LEFT  10/09/2018   IR NEPHROSTOMY EXCHANGE LEFT  10/27/2018   IR NEPHROSTOMY EXCHANGE LEFT  11/21/2018   IR NEPHROSTOMY EXCHANGE LEFT  01/05/2019   IR NEPHROSTOMY EXCHANGE LEFT  02/15/2019   IR NEPHROSTOMY EXCHANGE LEFT  03/29/2019   IR NEPHROSTOMY EXCHANGE RIGHT  10/02/2017   IR NEPHROSTOMY EXCHANGE RIGHT  11/28/2017   IR NEPHROSTOMY EXCHANGE RIGHT  01/05/2018   IR NEPHROSTOMY EXCHANGE RIGHT  02/16/2018   IR NEPHROSTOMY EXCHANGE RIGHT  03/30/2018   IR NEPHROSTOMY EXCHANGE RIGHT  05/12/2018   IR NEPHROSTOMY EXCHANGE RIGHT  06/21/2018   IR NEPHROSTOMY EXCHANGE RIGHT  08/04/2018   IR NEPHROSTOMY EXCHANGE RIGHT  09/18/2018   IR NEPHROSTOMY EXCHANGE RIGHT  10/27/2018    IR NEPHROSTOMY EXCHANGE RIGHT  11/21/2018   IR NEPHROSTOMY EXCHANGE RIGHT  01/05/2019   IR NEPHROSTOMY EXCHANGE RIGHT  02/15/2019   IR NEPHROSTOMY EXCHANGE RIGHT  03/29/2019   IR NEPHROSTOMY PLACEMENT LEFT  10/02/2017   IR RADIOLOGIST EVAL & MGMT  05/03/2017   IR US GUIDE VASC ACCESS LEFT  05/31/2017   IR US GUIDE VASC ACCESS RIGHT  04/06/2017   IR US GUIDE VASC ACCESS RIGHT  05/30/2017   LAPAROSCOPIC CHOLECYSTECTOMY  1990   LIPOSUCTION WITH LIPOFILLING Bilateral 04/16/2016   Procedure: LIPOSUCTION WITH LIPOFILLING TO BILATERAL CHEST;  Surgeon: Irene Limbo, MD;  Location: [place];  Service: Plastics;  Laterality: Bilateral;   MASTECTOMY W/ SENTINEL NODE BIOPSY Bilateral 12/11/2015   Procedure: RIGHT PROPHYLACTIC MASTECTOMY, LEFT TOTAL MASTECTOMY WITH LEFT AXILLARY SENTINEL LYMPH NODE BIOPSY;  Surgeon:  Stark Klein, MD;  Location: [place];  Service: General;  Laterality: Bilateral;   OSTOMY N/A 11/19/2014   Procedure: OSTOMY;  Surgeon: Michael [name], MD;  [facility];  Service: General;  Laterality: N/A;   PROCTOSCOPY N/A 04/01/2017   Procedure: RIDGE PROCTOSCOPY;  Surgeon: Michael [name], MD;  [facility];  Service: General;  Laterality: N/A;   REMOVAL OF BILATERAL TISSUE EXPANDERS WITH PLACEMENT OF BILATERAL BREAST IMPLANTS Bilateral 04/16/2016   Procedure: REMOVAL OF BILATERAL TISSUE EXPANDERS WITH PLACEMENT OF BILATERAL BREAST IMPLANTS;  Surgeon: Irene Limbo, MD;  Location: [place];  Service: Plastics;  Laterality: Bilateral;   ROBOTIC ASSISTED LAP VAGINAL HYSTERECTOMY N/A 11/19/2014   Procedure: ROBOTIC LYSIS OF ADHESIONS, CONVERTED TO LAPAROTOMY RADICAL UPPER VAGINECTOMY,LOW ANTERIOR BOWEL RESECTION, COLOSTOMY, BILATERAL URETERAL STENT PLACEMENT AND CYSTONOMY CLOSURE;  Surgeon: Everitt Amber, MD;  [facility];  Service: Gynecology;  Laterality: N/A;   TISSUE EXPANDER FILLING Bilateral 12/26/2015   Procedure: EXPANSION OF BILATERAL  CHEST TISSUE EXPANDERS (60 mL- Right; 75 mL- Left);  Surgeon: Irene Limbo, MD;  Location: [place];  Service: Plastics;  Laterality: Bilateral;   TONSILLECTOMY     TOTAL ABDOMINAL HYSTERECTOMY  March 2006   [facility]   and Bilateral Salpingoophorectomy/  staging for Ovarian cancer/  an   VULVAR LESION REMOVAL N/A 08/19/2021   Procedure: VULVAR LESION with biopsies;  Surgeon: Lafonda Mosses, MD;  Location[facility];  Service: Gynecology;  Laterality: N/A;   XI ROBOTIC ASSISTED LOWER ANTERIOR RESECTION N/A 04/01/2017   Procedure: XI ROBOTIC VS LAPAROSCOPIC COLOSTOMY TAKEDOWN WITH LYSIS OF ADHESIONS.;  Surgeon: Michael [name], MD;  [facility];  Service: General;  Laterality: N/A;  ERAS PATHWAY    Family History  Problem Relation Age of Onset   Cancer Mother 13       stomach ca   Hypertension Mother    Cancer Father 1       prostate ca   Diabetes Father    Heart disease Father        CABG   Hypertension Father    Hyperlipidemia Father    Obesity Father    Breast cancer Maternal Aunt        dx in her 51s   Lymphoma Paternal Aunt    Brain cancer Paternal Grandfather    Ovarian cancer Other    Diabetes Sister    Hypertension Brother y-10   Heart disease Brother        CABG   Diabetes Brother     Social History   Socioeconomic History   Marital status: Married    Spouse name: Armed forces technical officer   Number of children: 1   Years of education: Not on file   Highest education level: Not on file  Occupational History   Occupation: retired Therapist, 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[employer], sports - retired  Tobacco Use   Smoking status: Never   Smokeless tobacco: Never  Vaping Use   Vaping Use: Never used  Substance and Sexual Activity   Alcohol use: Yes    Comment: Occ   Drug use: No   Sexual activity: Not Currently  Other Topics Concern   Not on file  Social History Narrative   Exercise-- has not gotten back into it since cancer came back   Social  Determinants of Health   Financial Resource Strain: Not on file  Food Insecurity: Not on file  Transportation Needs: Not on file  Physical  Activity: Not on file  Stress: Not on file  Social Connections: Not on file  Intimate Partner Violence: Not on file    Outpatient Medications Prior to Visit  Medication Sig Dispense Refill   acetaminophen (TYLENOL) 500 MG tablet Take 500 mg by mouth every 6 (six) hours as needed.     Biotin 5 MG TABS Take 5 mg by mouth daily.      buPROPion (WELLBUTRIN XL) 150 MG 24 hr tablet Take 150 mg by mouth daily.     calcium citrate-vitamin D (CITRACAL+D) 315-200 MG-UNIT tablet Take 1 tablet by mouth daily.     Cholecalciferol (VITAMIN D3) 10000 UNITS capsule Take 10,000 Units by mouth See admin instructions. Take 1 tablet by mouth 43times per week.     clobetasol (OLUX) 0.05 % topical foam Apply topically 2 (two) times daily. 50 g 3   diphenhydrAMINE (BENADRYL) 25 mg capsule Take 1 capsule (25 mg total) by mouth every 8 (eight) hours as needed for itching, allergies or sleep. 30 capsule 0   famotidine-calcium carbonate-magnesium hydroxide (PEPCID COMPLETE) 10-800-165 MG chewable tablet Chew 1 tablet by mouth as needed.     ferrous sulfate 325 (65 FE) MG tablet Take 1 tablet (325 mg total) by mouth at bedtime. 30 tablet 3   fluticasone (FLONASE) 50 MCG/ACT nasal spray SHAKE LIQUID AND USE 2 SPRAYS IN EACH NOSTRIL DAILY 48 g 0   HYDROcodone bit-homatropine (HYCODAN) 5-1.5 MG/5ML syrup Take 5 mLs by mouth every 6 (six) hours as needed for cough. 120 mL 0   levothyroxine (SYNTHROID, LEVOTHROID) 150 MCG tablet Take 1 tablet (150 mcg total) by mouth daily before breakfast. 30 tablet 1   loratadine (CLARITIN) 10 MG tablet Take 10 mg by mouth daily.      omega-3 acid ethyl esters (LOVAZA) 1 G capsule Take 1 g by mouth 2 (two) times daily.      Polyethyl Glycol-Propyl Glycol (SYSTANE OP) Place 1 drop into both eyes daily as needed (dry eyes).      pregabalin (LYRICA) 50  MG capsule TAKE 1 CAPSULE TWICE A DAY 60 capsule 5   Prenatal Vit-Fe Fumarate-FA (PRENATAL VITAMIN PO) Take 1 capsule by mouth daily. Takes prenatal because there are no dyes in it     Probiotic Product (ALIGN PO) Take 1 capsule by mouth daily.     rosuvastatin (CRESTOR) 10 MG tablet Take 10 mg by mouth every evening.     Saccharomyces boulardii (FLORASTOR PO) Take 1 capsule by mouth at bedtime.     Tazarotene (ARAZLO) 0.045 % LOTN Apply 1 application topically daily. 45 g 0   Tbo-Filgrastim (GRANIX) 480 MCG/0.8ML SOSY injection 480 mcg subcutaneous injection every 6 days life long 11.2 mL 11   tirzepatide (MOUNJARO) 10 MG/0.5ML Pen Inject 10 mg into the skin once a week. 6 mL 0   tiZANidine (ZANAFLEX) 4 MG tablet Take 0.5-1 tablets (2-4 mg total) by mouth every 8 (eight) hours as needed for muscle spasms. 180 tablet 3   Apixaban (ELIQUIS PO) Take 5 mg by mouth daily. Pt takes 1tablet 8m once a day.     Melatonin 10 MG TABS Take 1 tablet by mouth at bedtime.     No facility-administered medications prior to visit.    Allergies  Allergen Reactions   Penicillins Hives and Swelling    Facial swelling/childhood allergy Has patient had a PCN reaction causing immediate rash, facial/tongue/throat swelling, SOB or lightheadedness with hypotension: Yes Has patient had a PCN reaction causing severe  rash involving mucus membranes or skin necrosis: Yes Has patient had a PCN reaction that required hospitalization yes Has patient had a PCN reaction occurring within the last 10 years: No If all of the above answers are "NO", then may proceed with Cephalosporin use.    Cefaclor Rash    Ceclor   Erythromycin Other (See Comments)    Gastritis, abd cramps   Tape Rash    blisters   Trimethoprim Rash   Ultram [Tramadol] Hives   Zarxio [Filgrastim] Other (See Comments)    Post injection, elevated heart rate, body feeling unwell, uneasy.    Cephalosporins Rash   Fluconazole Rash   Neomycin Rash     blisters   Oxycodone Other (See Comments)    " I just feel weird"   Pectin Rash    Pectin ring for stoma   Septra [Sulfamethoxazole-Trimethoprim] Rash   Sulfa Antibiotics Rash    Review of Systems  Constitutional:  Negative for fever and malaise/fatigue.  HENT:  Negative for congestion, sinus pain and sore throat.   Eyes:  Negative for blurred vision.  Respiratory:  Negative for cough, shortness of breath and wheezing.   Cardiovascular:  Negative for chest pain, palpitations and leg swelling.  Gastrointestinal:  Negative for abdominal pain, blood in stool, constipation, diarrhea, nausea and vomiting.  Genitourinary:  Positive for dysuria. Negative for frequency and hematuria.  Musculoskeletal:  Negative for falls, joint pain and myalgias.  Skin:  Negative for rash.       (-) [place]  Neurological:  Negative for dizziness, loss of consciousness and headaches.  Endo/Heme/Allergies:  Negative for environmental allergies.  Psychiatric/Behavioral:  Negative for depression. The patient is not nervous/anxious.        Objective:    Physical Exam Vitals and nursing note reviewed.  Constitutional:      General: She is not in acute distress.    Appearance: Normal appearance. She is not ill-appearing.  HENT:     Head: Normocephalic and atraumatic.     Right Ear: Tympanic membrane, ear canal and external ear normal.     Left Ear: Tympanic membrane, ear canal and external ear normal.  Eyes:     Extraocular Movements: Extraocular movements intact.     Pupils: Pupils are equal, round, and reactive to light.  Cardiovascular:     Rate and Rhythm: Normal rate and regular rhythm.     Heart sounds: Normal heart sounds. No murmur heard.    No gallop.  Pulmonary:     Effort: Pulmonary effort is normal. No respiratory distress.     Breath sounds: Normal breath sounds. No wheezing or rales.  Abdominal:     General: Bowel sounds are normal. There is no distension.     Palpations: Abdomen is  soft.     Tenderness: There is no abdominal tenderness. There is no guarding.  Skin:    General: Skin is warm and dry.  Neurological:     Mental Status: She is alert and oriented to person, place, and time.  Psychiatric:        Judgment: Judgment normal.     BP 120/70 (BP Location: Right Arm, Patient Position: Sitting, Cuff Size: Large)   Pulse 62   Temp (!) 97.5 F (36.4 C) (Oral)   Resp 18   Ht _0  (1.575 m)   Wt 221 lb (100.2 kg)   SpO2 100%   BMI 40.42 kg/m  Wt Readings from Last 3 Encounters:  02/25/22 216 lb 9  oz (98.2 kg)  02/18/22 216 lb (98 kg)  02/15/22 223 lb 3.2 oz (101.2 kg)    Diabetic Foot Exam - Simple   Simple Foot Form Diabetic Foot exam was performed with the following findings: Yes 02/15/2022 10:08 AM  Visual Inspection No deformities, no ulcerations, no other skin breakdown bilaterally: Yes Sensation Testing Intact to touch and monofilament testing bilaterally: Yes Pulse Check Posterior Tibialis and Dorsalis pulse intact bilaterally: Yes Comments    Lab Results  Component Value Date   WBC 5.1 02/15/2022   HGB 11.1 (L) 02/15/2022   HCT 34.1 (L) 02/15/2022   PLT 217.0 02/15/2022   GLUCOSE 90 02/15/2022   CHOL 77 02/15/2022   TRIG 82.0 02/15/2022   HDL 44.60 02/15/2022   LDLCALC 16 02/15/2022   ALT 21 02/15/2022   AST 17 02/15/2022   NA 139 02/15/2022   K 3.9 02/15/2022   CL 106 02/15/2022   CREATININE 1.74 (H) 02/15/2022   BUN 25 (H) 02/15/2022   CO2 24 02/15/2022   TSH 2.53 02/15/2022   INR 1.0 03/29/2019   HGBA1C 6.1 02/15/2022   MICROALBUR 12.1 (H) 02/15/2022    Lab Results  Component Value Date   TSH 2.53 02/15/2022   Lab Results  Component Value Date   WBC 5.1 02/15/2022   HGB 11.1 (L) 02/15/2022   HCT 34.1 (L) 02/15/2022   MCV 92.8 02/15/2022   PLT 217.0 02/15/2022   Lab Results  Component Value Date   NA 139 02/15/2022   K 3.9 02/15/2022   CHLORIDE 104 01/24/2017   CO2 24 02/15/2022   GLUCOSE 90 02/15/2022    BUN 25 (H) 02/15/2022   CREATININE 1.74 (H) 02/15/2022   BILITOT 0.3 02/15/2022   ALKPHOS 119 (H) 02/15/2022   AST 17 02/15/2022   ALT 21 02/15/2022   PROT 7.0 02/15/2022   ALBUMIN 4.0 02/15/2022   CALCIUM 9.1 02/15/2022   ANIONGAP 9 10/02/2019   EGFR 31 (L) 01/11/2022   GFR 29.99 (L) 02/15/2022   Lab Results  Component Value Date   CHOL 77 02/15/2022   Lab Results  Component Value Date   HDL 44.60 02/15/2022   Lab Results  Component Value Date   LDLCALC 16 02/15/2022   Lab Results  Component Value Date   TRIG 82.0 02/15/2022   Lab Results  Component Value Date   CHOLHDL 2 02/15/2022   Lab Results  Component Value Date   HGBA1C 6.1 02/15/2022       Assessment & Plan:   Problem List Items Addressed This Visit       Unprioritized   HLD (hyperlipidemia)   Relevant Orders   CBC with Differential/Platelet (Completed)   Lipid panel (Completed)   Essential (hemorrhagic) thrombocythemia ([place])   DM (diabetes mellitus) type II, controlled, with peripheral vascular disorder ([place])   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[place])    Per onc      Primary hypothyroidism    Check labs today      History of ovarian & endometrial cancer    Per onc      Class 2 severe obesity with serious comorbidity and body mass index (BMI) of 38.0 to 38.9 in adult [facility])    Per hww      Relevant Orders   CBC with Differential/Platelet (Completed)   VITAMIN D 25 Hydroxy (Vit-D Deficiency, Fractures) (Completed)   Vitamin B12 (Completed)   Insulin, random (Completed)   Benign essential HTN    Well controlled, no changes  to meds. Encouraged heart healthy diet such as the DASH diet and exercise as tolerated.       Relevant Orders   CBC with Differential/Platelet (Completed)   Microalbumin / creatinine urine ratio (Completed)   Type 2 diabetes mellitus with stage 4 chronic kidney disease, without long-term current use of insulin ([place])    hgba1c to be checked , minimize simple  carbs. Increase exercise as tolerated. Continue current meds       Relevant Orders   CBC with Differential/Platelet (Completed)   Comprehensive metabolic panel (Completed)   Hemoglobin A1c (Completed)   Microalbumin / creatinine urine ratio (Completed)   Insulin, random (Completed)   Preventative health care - Primary    ghm utd  Check labs  See avs       Relevant Orders   CBC with Differential/Platelet (Completed)   Comprehensive metabolic panel (Completed)   Hemoglobin A1c (Completed)   Lipid panel (Completed)   Microalbumin / creatinine urine ratio (Completed)   TSH (Completed)   VITAMIN D 25 Hydroxy (Vit-D Deficiency, Fractures) (Completed)   Vitamin B12 (Completed)   Insulin, random (Completed)   Other Visit Diagnoses     Vitamin D deficiency       Relevant Orders   VITAMIN D 25 Hydroxy (Vit-D Deficiency, Fractures) (Completed)   Hypothyroidism, unspecified type       Relevant Orders   CBC with Differential/Platelet (Completed)   TSH (Completed)   Dysuria       Relevant Orders   POCT Urinalysis Dipstick (Automated)   Ovarian cancer, unspecified laterality (HCC)   (Chronic)     Colostomy status (HCC)   (Chronic)     Major depressive disorder, single episode, moderate (HCC)   (Chronic)          Meds ordered this encounter  Medications   DISCONTD: doxycycline (VIBRA-TABS) 100 MG tablet    Sig: Take 1 tablet (100 mg total) by mouth 2 (two) times daily.    Dispense:  14 tablet    Refill:  0    I, [name] Held, DO, personally preformed the services described in this documentation.  All medical record entries made by the scribe were at my direction and in my presence.  I have reviewed the chart and discharge instructions (if applicable) and agree that the record reflects my personal performance and is accurate and complete. 02/15/2022   I,Amber Collins,acting as a scribe for [employer], DO.,have documented all relevant documentation on the  behalf of [name] Held, DO,as directed by  [name] Held, DO while in the presence of [name] Held, DO.  [name] Held, DO

## 2022-02-16 LAB — INSULIN, RANDOM: Insulin: 31.6 u[IU]/mL — ABNORMAL HIGH

## 2022-02-17 DIAGNOSIS — Z9889 Other specified postprocedural states: Secondary | ICD-10-CM | POA: Diagnosis not present

## 2022-02-17 DIAGNOSIS — E1122 Type 2 diabetes mellitus with diabetic chronic kidney disease: Secondary | ICD-10-CM | POA: Diagnosis not present

## 2022-02-17 DIAGNOSIS — E1142 Type 2 diabetes mellitus with diabetic polyneuropathy: Secondary | ICD-10-CM | POA: Diagnosis not present

## 2022-02-17 DIAGNOSIS — E1169 Type 2 diabetes mellitus with other specified complication: Secondary | ICD-10-CM | POA: Diagnosis not present

## 2022-02-17 DIAGNOSIS — E785 Hyperlipidemia, unspecified: Secondary | ICD-10-CM | POA: Diagnosis not present

## 2022-02-17 DIAGNOSIS — N1832 Chronic kidney disease, stage 3b: Secondary | ICD-10-CM | POA: Diagnosis not present

## 2022-02-17 DIAGNOSIS — E039 Hypothyroidism, unspecified: Secondary | ICD-10-CM | POA: Diagnosis not present

## 2022-02-17 DIAGNOSIS — I129 Hypertensive chronic kidney disease with stage 1 through stage 4 chronic kidney disease, or unspecified chronic kidney disease: Secondary | ICD-10-CM | POA: Diagnosis not present

## 2022-02-17 DIAGNOSIS — E1165 Type 2 diabetes mellitus with hyperglycemia: Secondary | ICD-10-CM | POA: Diagnosis not present

## 2022-02-17 DIAGNOSIS — E559 Vitamin D deficiency, unspecified: Secondary | ICD-10-CM | POA: Diagnosis not present

## 2022-02-18 ENCOUNTER — Encounter (INDEPENDENT_AMBULATORY_CARE_PROVIDER_SITE_OTHER): Payer: Self-pay | Admitting: Nurse Practitioner

## 2022-02-18 ENCOUNTER — Ambulatory Visit (INDEPENDENT_AMBULATORY_CARE_PROVIDER_SITE_OTHER): Payer: BC Managed Care – PPO | Admitting: Nurse Practitioner

## 2022-02-18 VITALS — BP 124/74 | HR 76 | Temp 98.2°F | Ht 62.0 in | Wt 216.0 lb

## 2022-02-18 DIAGNOSIS — Z6838 Body mass index (BMI) 38.0-38.9, adult: Secondary | ICD-10-CM | POA: Diagnosis not present

## 2022-02-18 DIAGNOSIS — F3289 Other specified depressive episodes: Secondary | ICD-10-CM

## 2022-02-19 DIAGNOSIS — E1122 Type 2 diabetes mellitus with diabetic chronic kidney disease: Secondary | ICD-10-CM | POA: Diagnosis not present

## 2022-02-19 DIAGNOSIS — E1142 Type 2 diabetes mellitus with diabetic polyneuropathy: Secondary | ICD-10-CM | POA: Diagnosis not present

## 2022-02-19 DIAGNOSIS — E1165 Type 2 diabetes mellitus with hyperglycemia: Secondary | ICD-10-CM | POA: Diagnosis not present

## 2022-02-19 DIAGNOSIS — I129 Hypertensive chronic kidney disease with stage 1 through stage 4 chronic kidney disease, or unspecified chronic kidney disease: Secondary | ICD-10-CM | POA: Diagnosis not present

## 2022-02-22 NOTE — Progress Notes (Signed)
Chief Complaint:   OBESITY [name] is here to discuss her progress with her obesity treatment plan along with follow-up of her obesity related diagnoses. [name] is on keeping a food journal and adhering to recommended goals of 1300-1400 calories and 75 grams of protein and states she is following her eating plan approximately 50% of the time. [name] states she is exercising 0 minutes 0 times per week.  Today's visit was #: 51 Starting weight: 208 lbs Starting date: 04/05/2018 Today's weight: 216 lbs Today's date: 02/18/2022 Total lbs lost to date: 0 lbs Total lbs lost since last in-office visit: 0  Interim History: [name] is currently struggling with UTI. Taking Doxy. Struggling with back pain. Has been extensively cleaning her house. She is getting 2 kittens this week. She is very frustrated with not losing weight. Struggles with how much protein to eat. She says nephrology is concerned about her protein intake but has not told her how much she can eat. Protein: 40-75 grams. Struggles with too much protein due to constipation and feeling bloated.  Subjective:   1. Other depression, with emotional eating [name] is taking Wellbutrin XL 150 mg and would like to stop it. Has been taking in the afternoon.  Assessment/Plan:   1. Other depression, with emotional eating Stop Wellbutrin XL and see how she does over the next 2-4 weeks. Avoid Topamax due to stage 4 CKD.  2. Class 2 severe obesity with serious comorbidity and body mass index (BMI) of 38.0 to 38.9 in adult, unspecified obesity type (HCC)  Billi is currently in the action stage of change. As such, her goal is to continue with weight loss efforts. She has agreed to keeping a food journal and adhering to recommended goals of 1300-1400 calories and 75 grams of protein.   Exercise goals: All adults should avoid inactivity. Some physical activity is better than none, and adults who participate in any amount of physical activity gain some  health benefits.  IC at next visit--Last IC 1884, on 04/05/18.  Behavioral modification strategies: increasing water intake, no skipping meals, meal planning and cooking strategies, and keeping a strict food journal.  [name] has agreed to follow-up with our clinic in 3 weeks. She was informed of the importance of frequent follow-up visits to maximize her success with intensive lifestyle modifications for her multiple health conditions.   Objective:   Blood pressure 124/74, pulse 76, temperature 98.2 F (36.8 C), height '5\' 2"'$  (1.575 m), weight 216 lb (98 kg), SpO2 98 %. Body mass index is 39.51 kg/m.  General: Cooperative, alert, well developed, in no acute distress. HEENT: Conjunctivae and lids unremarkable. Cardiovascular: Regular rhythm.  Lungs: Normal work of breathing. Neurologic: No focal deficits.   Lab Results  Component Value Date   CREATININE 1.74 (H) 02/15/2022   BUN 25 (H) 02/15/2022   NA 139 02/15/2022   K 3.9 02/15/2022   CL 106 02/15/2022   CO2 24 02/15/2022   Lab Results  Component Value Date   ALT 21 02/15/2022   AST 17 02/15/2022   ALKPHOS 119 (H) 02/15/2022   BILITOT 0.3 02/15/2022   Lab Results  Component Value Date   HGBA1C 6.1 02/15/2022   HGBA1C 6.0 07/23/2021   HGBA1C 6.3 (H) 06/16/2020   HGBA1C 7.9 (H) 09/01/2017   HGBA1C 5.3 04/01/2017   Lab Results  Component Value Date   INSULIN 14.1 06/16/2020   Lab Results  Component Value Date   TSH 2.53 02/15/2022   Lab Results  Component Value Date   CHOL 77 02/15/2022   HDL 44.60 02/15/2022   LDLCALC 16 02/15/2022   TRIG 82.0 02/15/2022   CHOLHDL 2 02/15/2022   Lab Results  Component Value Date   VD25OH 31.09 02/15/2022   VD25OH 25.9 (L) 06/16/2020   VD25OH 40.8 04/05/2018   Lab Results  Component Value Date   WBC 5.1 02/15/2022   HGB 11.1 (L) 02/15/2022   HCT 34.1 (L) 02/15/2022   MCV 92.8 02/15/2022   PLT 217.0 02/15/2022   Lab Results  Component Value Date   IRON 7 (L)  08/31/2017   TIBC 164 (L) 08/31/2017   FERRITIN 27 11/29/2014   Attestation Statements:   Reviewed by clinician on day of visit: allergies, medications, problem list, medical history, surgical history, family history, social history, and previous encounter notes.  Time spent on visit including pre-visit chart review and post-visit care and charting was 30 minutes.   I, Brendell Tyus, RMA, am acting as transcriptionist for Everardo Pacific, FNP.  I have reviewed the above documentation for accuracy and completeness, and I agree with the above. Everardo Pacific, FNP

## 2022-02-23 ENCOUNTER — Encounter: Payer: Self-pay | Admitting: Gynecologic Oncology

## 2022-02-25 ENCOUNTER — Inpatient Hospital Stay: Payer: BC Managed Care – PPO | Attending: Gynecologic Oncology | Admitting: Gynecologic Oncology

## 2022-02-25 ENCOUNTER — Other Ambulatory Visit: Payer: Self-pay

## 2022-02-25 ENCOUNTER — Encounter: Payer: Self-pay | Admitting: Gynecologic Oncology

## 2022-02-25 VITALS — BP 132/58 | HR 61 | Temp 98.2°F | Resp 17 | Wt 216.6 lb

## 2022-02-25 DIAGNOSIS — Z8542 Personal history of malignant neoplasm of other parts of uterus: Secondary | ICD-10-CM | POA: Diagnosis not present

## 2022-02-25 DIAGNOSIS — Z923 Personal history of irradiation: Secondary | ICD-10-CM | POA: Diagnosis not present

## 2022-02-25 DIAGNOSIS — D631 Anemia in chronic kidney disease: Secondary | ICD-10-CM | POA: Diagnosis not present

## 2022-02-25 DIAGNOSIS — K625 Hemorrhage of anus and rectum: Secondary | ICD-10-CM

## 2022-02-25 DIAGNOSIS — Z9221 Personal history of antineoplastic chemotherapy: Secondary | ICD-10-CM | POA: Insufficient documentation

## 2022-02-25 DIAGNOSIS — Z8543 Personal history of malignant neoplasm of ovary: Secondary | ICD-10-CM | POA: Diagnosis not present

## 2022-02-25 DIAGNOSIS — Z853 Personal history of malignant neoplasm of breast: Secondary | ICD-10-CM | POA: Diagnosis not present

## 2022-02-25 DIAGNOSIS — N321 Vesicointestinal fistula: Secondary | ICD-10-CM

## 2022-02-25 DIAGNOSIS — I129 Hypertensive chronic kidney disease with stage 1 through stage 4 chronic kidney disease, or unspecified chronic kidney disease: Secondary | ICD-10-CM | POA: Insufficient documentation

## 2022-02-25 DIAGNOSIS — I480 Paroxysmal atrial fibrillation: Secondary | ICD-10-CM | POA: Insufficient documentation

## 2022-02-25 DIAGNOSIS — Z7901 Long term (current) use of anticoagulants: Secondary | ICD-10-CM | POA: Diagnosis not present

## 2022-02-25 DIAGNOSIS — Z9013 Acquired absence of bilateral breasts and nipples: Secondary | ICD-10-CM | POA: Insufficient documentation

## 2022-02-25 DIAGNOSIS — Z7982 Long term (current) use of aspirin: Secondary | ICD-10-CM | POA: Insufficient documentation

## 2022-02-25 DIAGNOSIS — D709 Neutropenia, unspecified: Secondary | ICD-10-CM | POA: Diagnosis not present

## 2022-02-25 DIAGNOSIS — Z79899 Other long term (current) drug therapy: Secondary | ICD-10-CM | POA: Insufficient documentation

## 2022-02-25 DIAGNOSIS — N939 Abnormal uterine and vaginal bleeding, unspecified: Secondary | ICD-10-CM

## 2022-02-25 DIAGNOSIS — N183 Chronic kidney disease, stage 3 unspecified: Secondary | ICD-10-CM | POA: Insufficient documentation

## 2022-02-25 NOTE — Progress Notes (Signed)
Gynecologic Oncology Return Clinic Visit  02/25/22  Reason for Visit: Surveillance in the setting of a complex history of both endometrial and ovarian cancer  Treatment History: Oncology History Overview Note  Breast cancer of upper-inner quadrant of left female breast [facility])   Staging form: Breast, AJCC 7th Edition     Clinical stage from 12/31/2015: Stage IIA (T2, N0, M0) - Signed by Heath Lark, MD on 12/31/2015  ER,PR positive Her2 neg   History of ovarian & endometrial cancer  10/15/2004 Initial Diagnosis   History of ovarian cancer, treated with chemotherapy carbo/Taxol   03/17/2009 Bone Marrow Biopsy   B[facility] at [facility] showed neutropenia   01/23/2014 Bone Marrow Biopsy   Repeat bone marrow biopsy showed neutropenia   09/12/2014 Imaging   CT scan showed possible cancer   10/14/2014 Imaging   MRI show significant pelvic mass without invasion into the bladder but abutting to the rectum   11/19/2014 Surgery   she underwent surgery and had robotic-assisted lysis of adhesions, converted to laparotomy, radical upper vaginectomy and low anterior resection with colostomy. 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 Resection margins were negative   01/29/2015 - 03/10/2015 Radiation Therapy   She received adjuvant radiation   03/03/2015 Imaging   CT scan of the abdomen and pelvis showed status post interval removal of the bilateral nephroureteral stents. Worsening moderate right hydroureteronephrosis. Resolved left hydroureteronephrosis.   03/20/2015 Procedure   she had cystoscopy and stent placement for right hydronephrosis   05/02/2015 Surgery   Cystoscopy with right retrograde pyelogram interpretation. Diagnostic ureteroscopy. Removal of right ureteral stent.   05/14/2015 Imaging    CT scan of the abdomen and pelvis show unilateral right hydronephrosis with no residual cancer   08/18/2015 Imaging   CT scan showed hysterectomy with stable presacral soft tissue thickening. No definitive evidence of recurrent or metastatic disease. Severe right hydronephrosis   09/12/2015 Surgery   Cystoscopy with right retrograde pyelogram interpretation. Right ureteral stent placement 5 x 24 Polaris, no tether   10/07/2016 Imaging   Stable exam.  No new or progressive findings. 2. Stable appearance abnormal presacral soft tissue compatible with post treatment change. 3. Internal right ureteral stent with persistent right hydroureteronephrosis. Differentially decreased perfusion of the right kidney suggests a component of underlying obstructive uropathy. 4. Stable 13 mm left adrenal nodule, previously characterized as adenoma.       01/27/2017 Imaging   Stable postop and post radiation changes in presacral region. No evidence of recurrent or metastatic carcinoma within the chest, abdomen, or pelvis. Stable mild to moderate right hydroureteronephrosis, with right ureteral stent in appropriate position. Stable small benign left adrenal adenoma and left thyroid lobe nodule.   10/02/2019 Tumor Marker   Patient's tumor was tested for the following markers: CA-125 Results of the tumor marker test revealed 5.8   Breast cancer of upper-inner quadrant of left female breast ([place])  10/14/2015 Imaging   Screening mammogram showed possible distortion in the left breast.   10/24/2015 Imaging   Targeted ultrasound is performed, showing a 0.6 x 0.8 x 0.9 cm area of hypoechoic distortion at the 10 o'clock position of the left breast 5 cm from the nipple   10/24/2015 Imaging   Diagnostic imaging confirmed 0.6 x 0.8 x 0.9 cm distortion in the upper inner left breast   10/30/2015 Procedure  Left US guided biopsy was performed   10/30/2015 Pathology Results   Accession: [id number] showed invasive ductal  carcinoma, ER/PR positive, Her2 neg   11/05/2015 Genetic Testing   Genetic testing did not reveal a deleterious mutation.  Genetic testing did detect a Variant of Unknown Significance in the MSH6 gene called c.389A>G..  Genes tested include: APC, ATM, AXIN2, BARD1, BMPR1A, BRCA1, BRCA2, BRIP1, CDH1, CDKN2A, CHEK2, DICER1, EPCAM, GREM1, KIT, MEN1, MLH1, MSH2, MSH6, MUTYH, NBN, NF1, PALB2, PDGFRA, PMS2, POLD1, POLE, PTEN, RAD50, RAD51C, RAD51D, SDHA, SDHB, SDHC, SDHD, SMAD4, SMARCA4. STK11, TP53, TSC1, TSC2, and VHL.  UPDATE: MSH6 c.389A>G (p.His130Arg) has been reclassified as Likely Benign.  The amended report date is February 25, 2021.   12/11/2015 Surgery   She undwerwent bilateral mastectomy, left SLN biopsy and immediate reconstruction surgeries with bilateral plasma expanders   12/11/2015 Pathology Results   Accession: [id number] mastectomy specimens showed left breast ca, pT2N0M0   12/26/2015 Pathology Results   Accession: [id number] specimen from debridement showed no cancer   12/26/2015 Surgery   she underwent surgical debridement of necrotic tissue at site of plasma expander   01/12/2016 Imaging   Bone density is normal   02/02/2016 -  Anti-estrogen oral therapy   She started taking Arimidex   10/21/2017 Procedure   Ultrasound guided biopsy of a vague hypoechoic shadowing lesion, generally corresponding to area palpable abnormality, along chest lateral to the reconstructed left breast and inferior to the left axilla. No apparent complications.   01/17/2018 Imaging   Bone density scan is normal    She has a history of endometrial and ovarian endometrioid carcinoma treated in 2006 by Dr. [name] at [facility] in [place]. Her surgery (TAH, BSO) was followed by adjuvant chemotherapy with carboplatin plus paclitaxel due to the ovarian involvement and the presence of a cul de sac lesion also positive for disease. She denies receiving adjuvant radiation therapy. She is  unclear if she had metastatic endometrial cancer to the ovary or duel primaries. She had a complete response to therapy however developed leukopenia in July 2010. 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 She sees Dr. Alvy Bimler for this and is treated with G-CSF injections.   She began experiencing rectal pain approximately in January 2016. She also reports narrowing of caliber of the stool. She denies hematochezia. She does report approximately 3 months of vaginal spotting. She's had no specific follow-up for her gynecologic cancers in the past 4 years.   As part of workup of her rectal pain she underwent a CT scan of the abdomen and pelvis on 09/12/2014. This demonstrated a new right perirectal mass abutting the vaginal cuff measuring 3.8 x 4.9 cm. There is a limited fat plane between the mass and the rectum posteriorly. Rectal invasion could not be excluded. There were no other masses identified in the abdomen and pelvis or lymphadenopathy. There is no other evidence of metastatic disease or recurrent disease. There was no hydronephrosis. A CA-125 drawn on 09/12/2014 was normal at 10.   PET was negative for extrapelvic disease. MRI defined the lesion as a 3.5x5cm lesion to the right of the rectum at the vaginal cuff.   Colonoscopy was performed on 10/02/14 with transrectal [place] and biopsy and this revealed endometrioid adenocarcinoma. Of note, the lesion was not seen within the lumen of the rectum.    She then underwent a posterior supralevator exenteration with colostomy and bladder  repair and stent placement on 1/82/09 without complications with Dr Denman [name] and Dr Johney Maine.  Her postoperative course was uncomplicate with the exception of development of postop anemia.  Her final pathology revealed endometrioid adenocarcinoma invading the vagina and rectum with negative margins on the specimen. The  margin had been close (clinically) to the right pelvic sidewall which was marked with surgical clips.   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 It was treated with IV antibiotics and then on POD 15 she was taken to the OR for laparoscopic revision of the stoma with Dr Michael [name]. Postoperatively she had wound vac and packing for her stomal wound.   A retrograde cystogram on week 4 postop confirmed an intact bladder and the foley was removed.   She initially had some voiding issues with decreased sensation to void. We tested a post void residual in May, 2016 and this revealed adequate voiding.   On 12/30/14 she was admitted to [facility] with sepsis associated with Enterobacter Cloacae UTI. This was treated with IV antibiotics and then a prolonged course of oral cipro. Imaging performed at the time of admission (a CT of the abdo/pelvis) revealed: Bilateral double-J internal ureteral stents in adequate position with persistent mild bilateral hydronephrosis    She complete radiation therapy from 01/29/15 to 03/10/15 with 50Gy of external beam radiation and IMRT. She tolerated therapy well with minor skin irritation.   She had her ureteral stents removed in June, 2016, however, then developed right ureteral obstruction, hydroureter and pain. She went to the OR with Urologist Dr Tresa Moore on 03/20/15 for ureteral dilation and right stent placement (the left was draining well). No tumor was seen on cysto.    CT abdo/pelvis on 05/14/15 showed: no new lesions, stable thickening in right pelvis/distal right ureter consistent with radiation effect. The right ureteral stent was removed.   The patient's CT in January 2017 showed progression of her right ureteral obstruction after stent removal. Dr Tresa Moore took her to the OR on 09/05/15 for a cystoscopy, retrograde pyelogram and right ureteral stent placement.    The CT on 08/18/15 showed  decreased attenuation of the right renal parenchyma, right hydronephrosis that was severe no excretion of contrast on that for graphic phase imaging. The perivascular soft tissue thickening in the pelvis and presacral regions grossly stable consistent with radiation changes.   The patient was diagnosed with ER/PR positive left breast cancer in April 2017. Surgery is scheduled for May. She underwent bilateral mastectomy with expander reconstruction. Postoperatively she was treated with Arimidex.    Repeat CT imaging on 02/08/16 revealed : Stable post treatment changes in presacral region. No evidence of recurrent or metastatic carcinoma within the abdomen or pelvis. Interval placement of right ureteral stent in appropriate position. Moderate right hydroureteronephrosis remains stable. Stable benign left adrenal adenoma and hepatic steatosis.   On 06/30/16 she had replacement of her right ureteral stent with Dr Tresa Moore.   On 10/07/16 CT surveillance was performed which showed: Stable exam.  No new or progressive findings. Stable appearance abnormal presacral soft tissue compatible with post treatment change. Internal right ureteral stent with persistent right hydroureteronephrosis. Differentially decreased perfusion of the right kidney suggests a component of underlying obstructive uropathy. Stable 13 mm left adrenal nodule, previously characterized as adenoma. A parastomal hernia was noted.   CT 01/27/17 showed stable findings with no evidence of recurrence. Persistent right hydroureter was again noted.  Her colostomy bothered her greatly and she hoped to be free of stomal appliances. She was frustrated with repetitive stent replacements and desired definitive reconstruction/reimplantation of the right ureter.     Given that a 2 year disease free interval had elapsed, in 04/01/17 Dr Tresa Moore and Dr Johney Maine returned to the operating room with Ms Kassel for a right ureteral resection, and bladder hitch with  reimplantation and reversal of hartman's with temporary ileostomy complicated by postop development of urinoma which required a peritoneal drain and right ureteral stent.   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 She was offered permanent diversion with ileal conduit and permanent end colostomy. This was not considered a reasonable option at that time by [facility]. She became quite depressed by the slow recovery and sequelae of her last surgery. She sought second opinion with Gynecologic Oncology at [facility]. They offered referral to urology and colorectal specialists at [facility]. They recommended no additional cancer treatment as she was disease free. She is being worked up by them for possible laparotomy, revision of right uretero-neocystotomy, placement of VRAM flap, and possible bowel resection and revision of ileostomy. Prior to embarking on this surgery they requested she lose weight to a BMI of <30kg/m2.   She had lost weight to a BMI <30 throughout 2020. She experienced some episodes of passage of bloody mucous from the vagina in May, 2020 and so an CT scan was performed which showed no signs of pelvic recurrence.    In September the patient embarked on a large procedure at [facility].  She underwent an exploratory laparotomy panniculectomy cystectomy creation of urinary conduit with ileum, proctectomy, and formation of permanent and colostomy.  The procedure was uncomplicated.  She reported that while plastic surgery had planned to take part in the procedure with placement of a flap, it was determined that this was not necessary intraoperatively.  While the procedure note in Care Everywhere states that a hysterectomy was performed, this had already been performed more than a decade prior.  Additionally there was no myocutaneous flap placed despite the procedure  reporting as such.  No malignancy was identified in the specimens.   The patient had an uncomplicated procedure overall and a 7-day hospital stay with subsequent rehab.  She developed some wound healing issues at her colostomy site.   She is struggled with vaginal drainage and bleeding since.  She was seen in our clinic by Dr. Denman [name] in June 2021.  In the setting of continued vaginal bleeding and drainage, she sought consultation at [facility] with Dr. Fransisca Connors.  On exam, vagina noted to be atrophic.  Blood was seen from the apex of the vaginal vault and on bimanual exam the vault and apex extended posteriorly towards the sacrum without an obvious defect.  Given some concern for possibility of osteomyelitis this driving this drainage, MRI was performed.  MRI on 09/27/2020 showed a 2.3 x 1.9 x 3.3 cm fluid collection that appears contiguous with the rectal pouch via thickened fluid-filled tract and questionable thin fistulous tract anteriorly to the vaginal cuff.  Marked presacral soft tissue thickening and stranding likely reflecting posttreatment changes noted.  No definite evidence of local recurrence.  08/19/21: Excision of 1 cm left upper vulva/groin mass, Examiner anesthesia, vulvar biopsies.  Pathology revealed benign epidermal inclusion cyst.  Vaginal biopsy showed ulcerated squamous mucosa with lymphoplasmacytic infiltrate, no dysplasia or malignancy.  08/25/21: CT  of the abdomen and pelvis showed no acute findings.  No evidence of recurrent or metastatic disease.  Stable small benign left adrenal adenoma.  Interval History: She started hyperbaric oxygen treatment in early February.  She underwent 14 weeks of treatment, completed in May.  Initially had some improvement with hyperbaric oxygen.  Had to miss about a week and a half of treatments secondary to getting sick, and started having bleeding again.  Overall, still feels that bleeding has improved some with the hyperbaric oxygen.  More recently,  started having increased vaginal bleeding and discharge and developed back pain.  She was started on doxycycline and most recently was started on ciprofloxacin on the 21st when she saw her endocrinologist.  Since being on the Cipro, she has stopped having any bright red bleeding and back pain is improving.  Within the last year, she reports having to episodes of symptoms of a partial bowel obstruction.  She treated both of these at home conservatively.  With one episode, she was on the verge of going to the emergency department given degree of symptoms.  She has noticed that it seems to be when she eats certain foods, such as steak, that the symptoms can occur.  Skin around her stoma this has significantly improved.  She notices that the heat and moisture makes her breakout around her colostomy.  Tries to avoid being out in the heat.  Past Medical/Surgical History: Past Medical History:  Diagnosis Date   Anemia in chronic kidney disease    Anticoagulant long-term use    eliquis--- managed by cardiology   Benign essential HTN    Breast cancer, left ([place]) 10/2015   oncologist--- dr Alvy Bimler--- 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+, HER2 negative/  12-11-2015 bilateral mastecotmy w/ reconstruction (no radiation and no chemo)   Cancer of corpus uteri, except isthmus ([place]) 09/2004   oncologist-- dr rossi/ dr gorsuch:/   dx endometroid endometrial & ovarian cancer s/p  chemotheapy and surgery(TAH w/ BSO) :  recurrent 11-19-2014 post pelvic surgery and radiation 01-29-2015 to 03-10-2015   Chronic idiopathic neutropenia ([place])    presumed related to chemotherapy March 2006--- followed by dr Alvy Bimler (treatment w/ G-CSF injections   Chronic nausea    CKD (chronic kidney disease), stage III [facility])    nephrologist-  dr Madelon Lips   Difficult intravenous access    small veins--- hx PICC lines   DM type 2 (diabetes mellitus, type 2) ([place])    endocriologist--  dr Dorothea Ogle [place]    (08-18-2021  per pt checks blood sugar daily in am,  fasting sugar--92--130)   Dysrhythmia    Environmental and seasonal allergies    Generalized muscle weakness    GERD (gastroesophageal reflux disease)    Hiatal hernia    History of abdominal abscess 04/16/2017   post surgery 04-01-2017  --- resolved 10/ 2018   History of COVID-19 05/2021   per pt mild symptoms that resolved   History of gastric polyp    2014  duodenum   History of ileus 04/16/2017   resolved w/ no surgical intervention   History of radiation therapy    01-29-2015 to 03-10-2015  pelvis 50.4Gy   History of small bowel obstruction 01/2019   Hypothyroidism    monitored by dr Legrand Como altheimer   Ileostomy in place [facility]) 04/01/2017   created at same time colostomy takedown./  permnant 09/ 2020   Mixed dyslipidemia    Multiple thyroid nodules    Managed by  Dr. Harlow Asa   PAF (paroxysmal atrial fibrillation) [facility])    cardiologist--- dr h. Tamala Julian;  event monitor 05/ 2021 in epic, NSR with 1% burden Afib, first degree heart blockl and PACs;  echo 04/ 2021 ef 55-60%, mild LVH   Peripheral neuropathy    PONV (postoperative nausea and vomiting)    "scopolamine patch works for me"   Presence of urostomy ([place]) 04/2019   Radiation-induced dermatitis    contact dermatitis , radiation completed, rash only on ankles now.   Vitamin D deficiency    Vulval lesion    Wears glasses     Past Surgical History:  Procedure Laterality Date   APPENDECTOMY     BREAST RECONSTRUCTION WITH PLACEMENT OF TISSUE EXPANDER AND FLEX HD (ACELLULAR HYDRATED DERMIS) Bilateral 12/11/2015   Procedure: BILATERAL BREAST RECONSTRUCTION WITH PLACEMENT OF TISSUE EXPANDERS;  Surgeon: Irene Limbo, MD;  Location: [place];  Service: Plastics;  Laterality: Bilateral;   COLONOSCOPY WITH PROPOFOL N/A 08/21/2013   Procedure: COLONOSCOPY WITH PROPOFOL;  Surgeon: Cleotis Nipper, MD;  Location: [facility];  Service: Endoscopy;  Laterality: N/A;   COLOSTOMY  TAKEDOWN N/A 12/04/2014   Procedure: LAPROSCOPIC LYSIS OF ADHESIONS, SPLENIC MOBILIZATION, RELOCATION OF COLOSTOMY, DEBRIDEMENT INITIAL COLOSTOMY SITE;  Surgeon: Michael [name], MD;  Location: [facility];  Service: General;  Laterality: N/A;   CYSTECTOMY W/ URETEROILEAL CONDUIT  04/12/2019   _0 ;   INTESTINAL ANASTOMOSIS, PANNICULECTOMY, PELVIS EXENTENTION,  PARTIAL COLECTOMY REMOVAL ILEIUM WITH PERMNANT ILEOCOLOSTOMY   CYSTOGRAM N/A 06/01/2017   Procedure: CYSTOGRAM;  Surgeon: Alexis Frock, MD;  [facility]: [facility];  Service: Urology;  Laterality: N/A;   CYSTOSCOPY W/ RETROGRADES Right 11/21/2015   Procedure: CYSTOSCOPY WITH RETROGRADE PYELOGRAM;  Surgeon: Alexis Frock, MD;  [facility]: [facility];  Service: Urology;  Laterality: Right;   CYSTOSCOPY W/ URETERAL STENT PLACEMENT Right 11/21/2015   Procedure: CYSTOSCOPY WITH STENT REPLACEMENT;  Surgeon: Alexis Frock, MD;  [facility];  Service: Urology;  Laterality: Right;   CYSTOSCOPY W/ URETERAL STENT PLACEMENT Right 03/10/2016   Procedure: CYSTOSCOPY WITH STENT REPLACEMENT;  Surgeon: Alexis Frock, MD;  Location: [facility];  Service: Urology;  Laterality: Right;   CYSTOSCOPY W/ URETERAL STENT PLACEMENT Right 06/30/2016   Procedure: CYSTOSCOPY WITH RETROGRADE PYELOGRAM/URETERAL STENT EXCHANGE;  Surgeon: Alexis Frock, MD;  [facility];  Service: Urology;  Laterality: Right;   CYSTOSCOPY W/ URETERAL STENT PLACEMENT N/A 06/01/2017   Procedure: CYSTOSCOPY WITH EXAM UNDER ANESTHESIA;  Surgeon: Alexis Frock, MD;  [facility];  Service: Urology;  Laterality: N/A;   CYSTOSCOPY W/ URETERAL STENT PLACEMENT Right 08/17/2017   Procedure: CYSTOSCOPY WITH RETROGRADE PYELOGRAM/URETERAL STENT REMOVAL;  Surgeon: Alexis Frock, MD;  Location: [facility];  Service: Urology;  Laterality: Right;   CYSTOSCOPY WITH RETROGRADE PYELOGRAM, URETEROSCOPY AND STENT PLACEMENT Right 03/20/2015   Procedure:  CYSTOSCOPY WITH RETROGRADE PYELOGRAM, URETEROSCOPY WITH BALLOON DILATION AND STENT PLACEMENT ON RIGHT;  Surgeon: Alexis Frock, MD;  Location: [facility];  Service: Urology;  Laterality: Right;   CYSTOSCOPY WITH RETROGRADE PYELOGRAM, URETEROSCOPY AND STENT PLACEMENT Right 05/02/2015   Procedure: CYSTOSCOPY WITH RIGHT RETROGRADE PYELOGRAM,  DIAGNOSTIC URETEROSCOPY AND STENT PULL ;  Surgeon: Alexis Frock, MD;  Location: [facility];  Service: Urology;  Laterality: Right;   CYSTOSCOPY WITH RETROGRADE PYELOGRAM, URETEROSCOPY AND STENT PLACEMENT Right 09/05/2015   Procedure: CYSTOSCOPY WITH RETROGRADE PYELOGRAM,  AND STENT PLACEMENT;  Surgeon: Alexis Frock, MD;  [facility];  Service: Urology;  Laterality: Right;  CYSTOSCOPY WITH RETROGRADE PYELOGRAM, URETEROSCOPY AND STENT PLACEMENT Right 04/01/2017   Procedure: CYSTOSCOPY WITH RETROGRADE PYELOGRAM, URETEROSCOPY AND STENT PLACEMENT;  Surgeon: Alexis Frock, MD;  [facility];  Service: Urology;  Laterality: Right;   CYSTOSCOPY WITH STENT PLACEMENT Right 10/27/2016   Procedure: CYSTOSCOPY WITH STENT CHANGE and right retrograde pyelogram;  Surgeon: Alexis Frock, MD;  Location: [facility];  Service: Urology;  Laterality: Right;   EUS N/A 10/02/2014   Procedure: LOWER ENDOSCOPIC ULTRASOUND (EUS);  Surgeon: Arta Silence, MD;  Location: [facility];  Service: Endoscopy;  Laterality: N/A;   EXCISION SOFT TISSUE MASS RIGHT FOREMAN  12/08/2006   EYE SURGERY  as child   pytosis of eyelids repair   INCISION AND DRAINAGE OF WOUND Bilateral 12/26/2015   Procedure: DEBRIDEMENT OF BILATERAL MASTECTOMY FLAPS;  Surgeon: Irene Limbo, MD;  Location: [name];  Service: Plastics;  Laterality: Bilateral;   IR CV LINE INJECTION  05/31/2017   IR FLUORO GUIDE CV LINE LEFT  05/31/2017   IR FLUORO GUIDE CV LINE RIGHT  04/06/2017   IR FLUORO GUIDE CV MIDLINE PICC RIGHT  05/30/2017   IR  NEPHROSTOGRAM LEFT INITIAL PLACEMENT  09/02/2017   IR NEPHROSTOGRAM LEFT THRU EXISTING ACCESS  11/29/2017   IR NEPHROSTOGRAM RIGHT INITIAL PLACEMENT  09/02/2017   IR NEPHROSTOGRAM RIGHT THRU EXISTING ACCESS  09/13/2017   IR NEPHROSTOGRAM RIGHT THRU EXISTING ACCESS  11/29/2017   IR NEPHROSTOMY EXCHANGE LEFT  11/28/2017   IR NEPHROSTOMY EXCHANGE LEFT  01/05/2018   IR NEPHROSTOMY EXCHANGE LEFT  02/16/2018   IR NEPHROSTOMY EXCHANGE LEFT  03/30/2018   IR NEPHROSTOMY EXCHANGE LEFT  05/12/2018   IR NEPHROSTOMY EXCHANGE LEFT  06/21/2018   IR NEPHROSTOMY EXCHANGE LEFT  08/04/2018   IR NEPHROSTOMY EXCHANGE LEFT  09/18/2018   IR NEPHROSTOMY EXCHANGE LEFT  10/09/2018   IR NEPHROSTOMY EXCHANGE LEFT  10/27/2018   IR NEPHROSTOMY EXCHANGE LEFT  11/21/2018   IR NEPHROSTOMY EXCHANGE LEFT  01/05/2019   IR NEPHROSTOMY EXCHANGE LEFT  02/15/2019   IR NEPHROSTOMY EXCHANGE LEFT  03/29/2019   IR NEPHROSTOMY EXCHANGE RIGHT  10/02/2017   IR NEPHROSTOMY EXCHANGE RIGHT  11/28/2017   IR NEPHROSTOMY EXCHANGE RIGHT  01/05/2018   IR NEPHROSTOMY EXCHANGE RIGHT  02/16/2018   IR NEPHROSTOMY EXCHANGE RIGHT  03/30/2018   IR NEPHROSTOMY EXCHANGE RIGHT  05/12/2018   IR NEPHROSTOMY EXCHANGE RIGHT  06/21/2018   IR NEPHROSTOMY EXCHANGE RIGHT  08/04/2018   IR NEPHROSTOMY EXCHANGE RIGHT  09/18/2018   IR NEPHROSTOMY EXCHANGE RIGHT  10/27/2018   IR NEPHROSTOMY EXCHANGE RIGHT  11/21/2018   IR NEPHROSTOMY EXCHANGE RIGHT  01/05/2019   IR NEPHROSTOMY EXCHANGE RIGHT  02/15/2019   IR NEPHROSTOMY EXCHANGE RIGHT  03/29/2019   IR NEPHROSTOMY PLACEMENT LEFT  10/02/2017   IR RADIOLOGIST EVAL & MGMT  05/03/2017   IR US GUIDE VASC ACCESS LEFT  05/31/2017   IR US GUIDE VASC ACCESS RIGHT  04/06/2017   IR US GUIDE VASC ACCESS RIGHT  05/30/2017   LAPAROSCOPIC CHOLECYSTECTOMY  1990   LIPOSUCTION WITH LIPOFILLING Bilateral 04/16/2016   Procedure: LIPOSUCTION WITH LIPOFILLING TO BILATERAL CHEST;  Surgeon: Irene Limbo, MD;  Location:  [place];  Service: Plastics;  Laterality: Bilateral;   MASTECTOMY W/ SENTINEL NODE BIOPSY Bilateral 12/11/2015   Procedure: RIGHT PROPHYLACTIC MASTECTOMY, LEFT TOTAL MASTECTOMY WITH LEFT AXILLARY SENTINEL LYMPH NODE BIOPSY;  Surgeon: Stark Klein, MD;  Location: [place];  Service: General;  Laterality: Bilateral;   OSTOMY N/A 11/19/2014  Procedure: OSTOMY;  Surgeon: Michael [name], MD;  Location: [facility];  Service: General;  Laterality: N/A;   PROCTOSCOPY N/A 04/01/2017   Procedure: RIDGE PROCTOSCOPY;  Surgeon: Michael [name], MD;  Location: [facility];  Service: General;  Laterality: N/A;   REMOVAL OF BILATERAL TISSUE EXPANDERS WITH PLACEMENT OF BILATERAL BREAST IMPLANTS Bilateral 04/16/2016   Procedure: REMOVAL OF BILATERAL TISSUE EXPANDERS WITH PLACEMENT OF BILATERAL BREAST IMPLANTS;  Surgeon: Irene Limbo, MD;  Location: [place];  Service: Plastics;  Laterality: Bilateral;   ROBOTIC ASSISTED LAP VAGINAL HYSTERECTOMY N/A 11/19/2014   Procedure: ROBOTIC LYSIS OF ADHESIONS, CONVERTED TO LAPAROTOMY RADICAL UPPER VAGINECTOMY,LOW ANTERIOR BOWEL RESECTION, COLOSTOMY, BILATERAL URETERAL STENT PLACEMENT AND CYSTONOMY CLOSURE;  Surgeon: Everitt Amber, MD;  Location: [facility];  Service: Gynecology;  Laterality: N/A;   TISSUE EXPANDER FILLING Bilateral 12/26/2015   Procedure: EXPANSION OF BILATERAL CHEST TISSUE EXPANDERS (60 mL- Right; 75 mL- Left);  Surgeon: Irene Limbo, MD;  Location: [place];  Service: Plastics;  Laterality: Bilateral;   TONSILLECTOMY     TOTAL ABDOMINAL HYSTERECTOMY  March 2006   [facility]   and Bilateral Salpingoophorectomy/  staging for Ovarian cancer/  an   VULVAR LESION REMOVAL N/A 08/19/2021   Procedure: VULVAR LESION with biopsies;  Surgeon: Lafonda Mosses, MD;  Location: [facility];  Service: Gynecology;  Laterality: N/A;   XI ROBOTIC ASSISTED LOWER ANTERIOR RESECTION N/A 04/01/2017   Procedure: XI ROBOTIC VS  LAPAROSCOPIC COLOSTOMY TAKEDOWN WITH LYSIS OF ADHESIONS.;  Surgeon: Michael [name], MD;  Location: [facility];  Service: General;  Laterality: N/A;  ERAS PATHWAY    Family History  Problem Relation Age of Onset   Cancer Mother 68       stomach ca   Hypertension Mother    Cancer Father 83       prostate ca   Diabetes Father    Heart disease Father        CABG   Hypertension Father    Hyperlipidemia Father    Obesity Father    Breast cancer Maternal Aunt        dx in her 75s   Lymphoma Paternal Aunt    Brain cancer Paternal Grandfather    Ovarian cancer Other    Diabetes Sister    Hypertension Brother y-10   Heart disease Brother        CABG   Diabetes Brother     Social History   Socioeconomic History   Marital status: Married    Spouse name: Armed forces technical officer   Number of children: 1   Years of education: Not on file   Highest education level: Not on file  Occupational History   Occupation: retired Therapist, 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, sports - retired  Tobacco Use   Smoking status: Never   Smokeless tobacco: Never  Vaping Use   Vaping Use: Never used  Substance and Sexual Activity   Alcohol use: Yes    Comment: Occ   Drug use: No   Sexual activity: Not Currently  Other Topics Concern   Not on file  Social History Narrative   Exercise-- has not gotten back into it since cancer came back   Social Determinants of Health   Financial Resource Strain: Not on file  Food Insecurity: Not on file  Transportation Needs: Not on file  Physical Activity: Not on file  Stress: Not on file  Social Connections: Not on file    Current Medications:  Current Outpatient Medications:    acetaminophen (TYLENOL) 500 MG tablet, Take 500 mg by mouth every 6 (six) hours as needed., Disp: , Rfl:    aspirin EC 81 MG tablet, Take 1 tablet (81 mg total) by mouth daily. Swallow whole., Disp: 90 tablet, Rfl: 3   Biotin 5 MG TABS, Take 5 mg by mouth daily. , Disp: , Rfl:    buPROPion (WELLBUTRIN XL) 150 MG  24 hr tablet, Take 150 mg by mouth daily., Disp: , Rfl:    calcium citrate-vitamin D (CITRACAL+D) 315-200 MG-UNIT tablet, Take 1 tablet by mouth daily., Disp: , Rfl:    Cholecalciferol (VITAMIN D3) 10000 UNITS capsule, Take 10,000 Units by mouth See admin instructions. Take 1 tablet by mouth 43times per week., Disp: , Rfl:    ciprofloxacin (CIPRO) 500 MG tablet, Take by mouth., Disp: , Rfl:    clobetasol (OLUX) 0.05 % topical foam, Apply topically 2 (two) times daily., Disp: 50 g, Rfl: 3   diphenhydrAMINE (BENADRYL) 25 mg capsule, Take 1 capsule (25 mg total) by mouth every 8 (eight) hours as needed for itching, allergies or sleep., Disp: 30 capsule, Rfl: 0   famotidine-calcium carbonate-magnesium hydroxide (PEPCID COMPLETE) 10-800-165 MG chewable tablet, Chew 1 tablet by mouth as needed., Disp: , Rfl:    ferrous sulfate 325 (65 FE) MG tablet, Take 1 tablet (325 mg total) by mouth at bedtime., Disp: 30 tablet, Rfl: 3   fluticasone (FLONASE) 50 MCG/ACT nasal spray, SHAKE LIQUID AND USE 2 SPRAYS IN EACH NOSTRIL DAILY, Disp: 48 g, Rfl: 0   HYDROcodone bit-homatropine (HYCODAN) 5-1.5 MG/5ML syrup, Take 5 mLs by mouth every 6 (six) hours as needed for cough., Disp: 120 mL, Rfl: 0   levothyroxine (SYNTHROID, LEVOTHROID) 150 MCG tablet, Take 1 tablet (150 mcg total) by mouth daily before breakfast., Disp: 30 tablet, Rfl: 1   loratadine (CLARITIN) 10 MG tablet, Take 10 mg by mouth daily. , Disp: , Rfl:    omega-3 acid ethyl esters (LOVAZA) 1 G capsule, Take 1 g by mouth 2 (two) times daily. , Disp: , Rfl:    Polyethyl Glycol-Propyl Glycol (SYSTANE OP), Place 1 drop into both eyes daily as needed (dry eyes). , Disp: , Rfl:    pregabalin (LYRICA) 50 MG capsule, TAKE 1 CAPSULE TWICE A DAY, Disp: 60 capsule, Rfl: 5   Prenatal Vit-Fe Fumarate-FA (PRENATAL VITAMIN PO), Take 1 capsule by mouth daily. Takes prenatal because there are no dyes in it, Disp: , Rfl:    Probiotic Product (ALIGN PO), Take 1 capsule by  mouth daily., Disp: , Rfl:    rosuvastatin (CRESTOR) 10 MG tablet, Take 10 mg by mouth every evening., Disp: , Rfl:    Saccharomyces boulardii (FLORASTOR PO), Take 1 capsule by mouth at bedtime., Disp: , Rfl:    Tazarotene (ARAZLO) 0.045 % LOTN, Apply 1 application topically daily., Disp: 45 g, Rfl: 0   Tbo-Filgrastim (GRANIX) 480 MCG/0.8ML SOSY injection, 480 mcg subcutaneous injection every 6 days life long, Disp: 11.2 mL, Rfl: 11   tirzepatide (MOUNJARO) 10 MG/0.5ML Pen, Inject 10 mg into the skin once a week., Disp: 6 mL, Rfl: 0   tiZANidine (ZANAFLEX) 4 MG tablet, Take 0.5-1 tablets (2-4 mg total) by mouth every 8 (eight) hours as needed for muscle spasms., Disp: 180 tablet, Rfl: 3  Review of Systems: + Ringing in ears, occasional palpitations, blood in urine, vaginal bleeding, rectal and vaginal discharge, occasional joint pain, back pain, oozing/bleeding easily. Denies appetite changes, fevers, chills, fatigue,  unexplained weight changes. Denies hearing loss, neck lumps or masses, mouth sores, ringing in ears or voice changes. Denies cough or wheezing.  Denies shortness of breath. Denies chest pain or palpitations. Denies leg swelling. Denies abdominal distention, pain, blood in stools, constipation, diarrhea, nausea, vomiting, or early satiety. Denies pain with intercourse, dysuria, frequency, hematuria or incontinence. Denies hot flashes, pelvic pain.   Denies muscle pain/cramps. Denies itching, rash, or wounds. Denies dizziness, headaches, numbness or seizures. Denies swollen lymph nodes or glands. Denies anxiety, depression, confusion, or decreased concentration.  Physical Exam: BP (!) 132/58 (BP Location: Left Arm, Patient Position: Sitting)   Pulse 61   Temp 98.2 F (36.8 C) (Oral)   Resp 17   Wt 216 lb 9 oz (98.2 kg)   SpO2 99%   BMI 39.61 kg/m  General: Alert, oriented, no acute distress. HEENT: Normocephalic, atraumatic, sclera anicteric. Chest: Clear to  auscultation bilaterally.  No wheezes or rhonchi. Cardiovascular: Regular rate and rhythm, no murmurs. Abdomen: Obese, soft, nontender.  Normoactive bowel sounds.  Ostomy bag in each lower quadrant.  Around the left lower quadrant bag, there is improved erythema with some mild skin crusting.   Extremities: Grossly normal range of motion.  Warm, well perfused.  No edema bilaterally. Lymphatics: No cervical, supraclavicular, or inguinal adenopathy. GU: Normal appearing external female genitalia.  On speculum exam with a small speculum, vaginal mucosa is normal in appearance.  There is a ring of constricted tissue where the vagina narrows approximately 4 cm above the introitus, vaginal mucosa is healthy appearing.  No discharge or bleeding noted.  On bimanual exam, no masses or nodularity appreciated.  Narrowing of the vagina is palpated with cavity above this narrowing.  No nodularity on rectovaginal exam.  Laboratory & Radiologic Studies: None new  Assessment & Plan: [name] is a 66 y.o. woman with history of recurrent endometrioid endometrial/ovarian cancer.   S/p exploratory laparotomy, posterior supralevator pelvic exenteration, end colostomy, bladder repair, ureteral stenting with complete resection and negative margins on 11/09/14. S/p adjuvant radiation completed 03/10/15. Disease free since this time.  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   Status post pelvic exenteration procedure [facility] in September 2020 with proctectomy and cystectomy and formation of permanent colostomy and ileal conduit.  Vaginectomy not performed.   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 No specific follow-up or surveillance recommended by genetics  counselor.   Plan: 1) History of recurrent endometrial/ovarian cancer: s/p complete resection with negative margins and s/p adjuvant radiation. No chemotherapy (in the recurrent setting) due to chronic idiopathic neutropenia. She has a history of primary adjuvant chemotherapy after her initial diagnosis of stage III endometrial cancer in 2006. CT imaging and extensive pelvic surgical evaluation in 2020 demonstrated complete clinical response. Most recent imaging in early 2022 with no definitive evidence of recurrent disease. CT imaging was performed in early 2023 given abdominal symptoms.  This was negative for any evidence of disease.   Will continue annual surveillance exams until 2026.    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 She has persistent vaginal drainage despite cystectomy and total proctectomy. She patient continues to have vaginal and rectal bleeding. She's been previously counseled that in the setting of vagina  having been left in situ at the time of her exenteration in 04/2019, surgery to performed total vaginectomy and placement of myocutaneous flap would require another major surgery that would likely involve having her colostomy and ileal conduit having to be taken down and would be associated with significant morbidity.  Additionally there would be no guarantee that her tissues, severely damaged by radiation, would heal such as a second surgery and result in good outcomes. Patient has stopped her Eliquis since I saw her last.  This helped significantly with her bleeding.  Hyperbaric oxygen seem to help some also.  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  Gust with both radiology and interventional radiology today.  Recommendation was against any consideration of embolization.  Other option would be  drain placement but secondary to the small fluid collection and what is likely very thick tissue around the fluid collection, recommendation was made against consideration of drain placement.  3) Neutropenia - Dr Alvy Bimler is managing this and we appreciate. Patient is on granix.    4) Breast cancer - history of stage IIA. ER/PR positive. Treated with surgery and arimidex.   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 Patient follows-up with Dr Cristina Gong for close colon cancer and upper GI cancer surveillance   I have asked the patient to call me if and when she develops recurrent increasing vaginal bleeding or discharge.  I think she likely is going to need intermittent courses of antibiotic therapy as this seems to be our best tool in treating her pelvic symptoms.  From a cancer standpoint, I will continue to see her every 6 months.  36 minutes of total time was spent for this patient encounter, including preparation, face-to-face counseling with the patient and coordination of care, and documentation of the encounter.  Jeral Pinch, MD  Division of Gynecologic Oncology  Department of Obstetrics and Gynecology   [facility]

## 2022-02-25 NOTE — Patient Instructions (Signed)
It was good to see you today.  I do not see or feel any evidence of cancer on your exam.  I will let you know what I hear from the radiologist about any additional imaging to identify whether embolization in your pelvis would be feasible and/or helpful.  I will plan to see use in 6 months for follow-up.  Please call sometime after the beginning of the year to schedule a visit in late January.  As always, please call if you have any new and concerning symptoms before then.

## 2022-03-10 ENCOUNTER — Encounter (INDEPENDENT_AMBULATORY_CARE_PROVIDER_SITE_OTHER): Payer: Self-pay

## 2022-03-11 ENCOUNTER — Ambulatory Visit (INDEPENDENT_AMBULATORY_CARE_PROVIDER_SITE_OTHER): Payer: Medicare Other | Admitting: Family Medicine

## 2022-03-13 ENCOUNTER — Encounter: Payer: Self-pay | Admitting: Family Medicine

## 2022-03-13 NOTE — Assessment & Plan Note (Signed)
ghm utd Check labs  See avs  

## 2022-03-13 NOTE — Assessment & Plan Note (Signed)
hgba1c to be checked, minimize simple carbs. Increase exercise as tolerated. Continue current meds  

## 2022-03-15 ENCOUNTER — Encounter (INDEPENDENT_AMBULATORY_CARE_PROVIDER_SITE_OTHER): Payer: Self-pay | Admitting: Family Medicine

## 2022-03-15 DIAGNOSIS — N184 Chronic kidney disease, stage 4 (severe): Secondary | ICD-10-CM

## 2022-03-15 NOTE — Telephone Encounter (Signed)
Please send in 1 month of mounjaro 10 mg to [facility]

## 2022-03-17 MED ORDER — TIRZEPATIDE 10 MG/0.5ML ~~LOC~~ SOAJ: 10.0000 mg | SUBCUTANEOUS | 0 refills | Status: DC

## 2022-03-22 ENCOUNTER — Encounter (INDEPENDENT_AMBULATORY_CARE_PROVIDER_SITE_OTHER): Payer: Self-pay | Admitting: Family Medicine

## 2022-03-22 ENCOUNTER — Ambulatory Visit (INDEPENDENT_AMBULATORY_CARE_PROVIDER_SITE_OTHER): Payer: BC Managed Care – PPO | Admitting: Family Medicine

## 2022-03-22 VITALS — BP 106/64 | HR 75 | Temp 98.3°F | Ht 62.0 in | Wt 214.0 lb

## 2022-03-22 DIAGNOSIS — L89319 Pressure ulcer of right buttock, unspecified stage: Secondary | ICD-10-CM | POA: Diagnosis not present

## 2022-03-22 DIAGNOSIS — Z936 Other artificial openings of urinary tract status: Secondary | ICD-10-CM | POA: Diagnosis not present

## 2022-03-22 DIAGNOSIS — Z4801 Encounter for change or removal of surgical wound dressing: Secondary | ICD-10-CM | POA: Diagnosis not present

## 2022-03-22 DIAGNOSIS — E669 Obesity, unspecified: Secondary | ICD-10-CM

## 2022-03-22 DIAGNOSIS — Z933 Colostomy status: Secondary | ICD-10-CM | POA: Diagnosis not present

## 2022-03-22 DIAGNOSIS — E1169 Type 2 diabetes mellitus with other specified complication: Secondary | ICD-10-CM | POA: Diagnosis not present

## 2022-03-22 DIAGNOSIS — Z6839 Body mass index (BMI) 39.0-39.9, adult: Secondary | ICD-10-CM | POA: Diagnosis not present

## 2022-03-22 DIAGNOSIS — Z7985 Long-term (current) use of injectable non-insulin antidiabetic drugs: Secondary | ICD-10-CM | POA: Diagnosis not present

## 2022-03-22 MED ORDER — TIRZEPATIDE 10 MG/0.5ML ~~LOC~~ SOAJ
10.0000 mg | SUBCUTANEOUS | 0 refills | Status: DC
  Filled 2022-05-24: qty 2, 28d supply, fill #0

## 2022-03-22 MED ORDER — TIRZEPATIDE 10 MG/0.5ML ~~LOC~~ SOAJ: 10.0000 mg | SUBCUTANEOUS | 0 refills | Status: DC

## 2022-03-23 DIAGNOSIS — L89319 Pressure ulcer of right buttock, unspecified stage: Secondary | ICD-10-CM | POA: Diagnosis not present

## 2022-03-23 DIAGNOSIS — Z4801 Encounter for change or removal of surgical wound dressing: Secondary | ICD-10-CM | POA: Diagnosis not present

## 2022-03-23 DIAGNOSIS — Z933 Colostomy status: Secondary | ICD-10-CM | POA: Diagnosis not present

## 2022-03-23 DIAGNOSIS — Z936 Other artificial openings of urinary tract status: Secondary | ICD-10-CM | POA: Diagnosis not present

## 2022-03-29 NOTE — Progress Notes (Unsigned)
Chief Complaint:   OBESITY [name] is here to discuss her progress with her obesity treatment plan along with follow-up of her obesity related diagnoses. [name] is on keeping a food journal and adhering to recommended goals of 1300-1400 calories and 75 grams of protein daily and states she is following her eating plan approximately 75% of the time. [name] states she is doing 0 minutes 0 times per week.  Today's visit was #: 58 Starting weight: 208 lbs Starting date: 04/05/2018 Today's weight: 214 lbs Today's date: 03/22/2022 Total lbs lost to date: 0 Total lbs lost since last in-office visit: 2  Interim History: [name] continues to do well with weight loss. She is working on meeting her calorie and protein goals.   Subjective:   1. Type 2 diabetes mellitus with other specified complication, unspecified whether long term insulin use ([place]) [name] is doing well with her diet, exercise, and weight loss. She is tolerating Mounjaro well with no side effects. She requests a refill today.   Assessment/Plan:   1. Type 2 diabetes mellitus with other specified complication, unspecified whether long term insulin use ([place]) [name] will continue Mounjaro 10 mg once weekly, and we will refill for 1 month.  - tirzepatide (MOUNJARO) 10 MG/0.5ML Pen; Inject 10 mg into the skin once a week.  Dispense: 6 mL; Refill: 0  2. Obesity, Current BMI 39.1 [name] is currently in the action stage of change. As such, her goal is to continue with weight loss efforts. She has agreed to keeping a food journal and adhering to recommended goals of 1300-1400 calories and 75+ grams of protein daily.   Behavioral modification strategies: increasing lean protein intake and emotional eating strategies.  [name] has agreed to follow-up with our clinic in 4 weeks. She was informed of the importance of frequent follow-up visits to maximize her success with intensive lifestyle modifications for her multiple health conditions.    Objective:   Blood pressure 106/64, pulse 75, temperature 98.3 F (36.8 C), height '5\' 2"'$  (1.575 m), weight 214 lb (97.1 kg), SpO2 96 %. Body mass index is 39.14 kg/m.  General: Cooperative, alert, well developed, in no acute distress. HEENT: Conjunctivae and lids unremarkable. Cardiovascular: Regular rhythm.  Lungs: Normal work of breathing. Neurologic: No focal deficits.   Lab Results  Component Value Date   CREATININE 1.74 (H) 02/15/2022   BUN 25 (H) 02/15/2022   NA 139 02/15/2022   K 3.9 02/15/2022   CL 106 02/15/2022   CO2 24 02/15/2022   Lab Results  Component Value Date   ALT 21 02/15/2022   AST 17 02/15/2022   ALKPHOS 119 (H) 02/15/2022   BILITOT 0.3 02/15/2022   Lab Results  Component Value Date   HGBA1C 6.1 02/15/2022   HGBA1C 6.0 07/23/2021   HGBA1C 6.3 (H) 06/16/2020   HGBA1C 7.9 (H) 09/01/2017   HGBA1C 5.3 04/01/2017   Lab Results  Component Value Date   INSULIN 14.1 06/16/2020   Lab Results  Component Value Date   TSH 2.53 02/15/2022   Lab Results  Component Value Date   CHOL 77 02/15/2022   HDL 44.60 02/15/2022   LDLCALC 16 02/15/2022   TRIG 82.0 02/15/2022   CHOLHDL 2 02/15/2022   Lab Results  Component Value Date   VD25OH 31.09 02/15/2022   VD25OH 25.9 (L) 06/16/2020   VD25OH 40.8 04/05/2018   Lab Results  Component Value Date   WBC 5.1 02/15/2022   HGB 11.1 (L) 02/15/2022   HCT  34.1 (L) 02/15/2022   MCV 92.8 02/15/2022   PLT 217.0 02/15/2022   Lab Results  Component Value Date   IRON 7 (L) 08/31/2017   TIBC 164 (L) 08/31/2017   FERRITIN 27 11/29/2014   Attestation Statements:   Reviewed by clinician on day of visit: allergies, medications, problem list, medical history, surgical history, family history, social history, and previous encounter notes.  Time spent on visit including pre-visit chart review and post-visit care and charting was 30 minutes.   I, Trixie Dredge, am acting as transcriptionist for Dennard Nip,  MD.  I have reviewed the above documentation for accuracy and completeness, and I agree with the above. -  Dennard Nip, MD

## 2022-03-31 DIAGNOSIS — H2513 Age-related nuclear cataract, bilateral: Secondary | ICD-10-CM | POA: Diagnosis not present

## 2022-03-31 DIAGNOSIS — E119 Type 2 diabetes mellitus without complications: Secondary | ICD-10-CM | POA: Diagnosis not present

## 2022-03-31 DIAGNOSIS — H5213 Myopia, bilateral: Secondary | ICD-10-CM | POA: Diagnosis not present

## 2022-03-31 LAB — HM DIABETES EYE EXAM

## 2022-04-06 ENCOUNTER — Ambulatory Visit: Payer: BC Managed Care – PPO | Admitting: Cardiology

## 2022-04-14 IMAGING — DX DG SACRUM/COCCYX 2+V
3 series · 3 of 3 positions shown · non-contrast
Comparison: None.

CLINICAL DATA: Chronic low back pain radiating to the buttocks and
left hip. No known injury.

EXAM:
SACRUM AND COCCYX - 2+ VIEW

[sacrum ap]
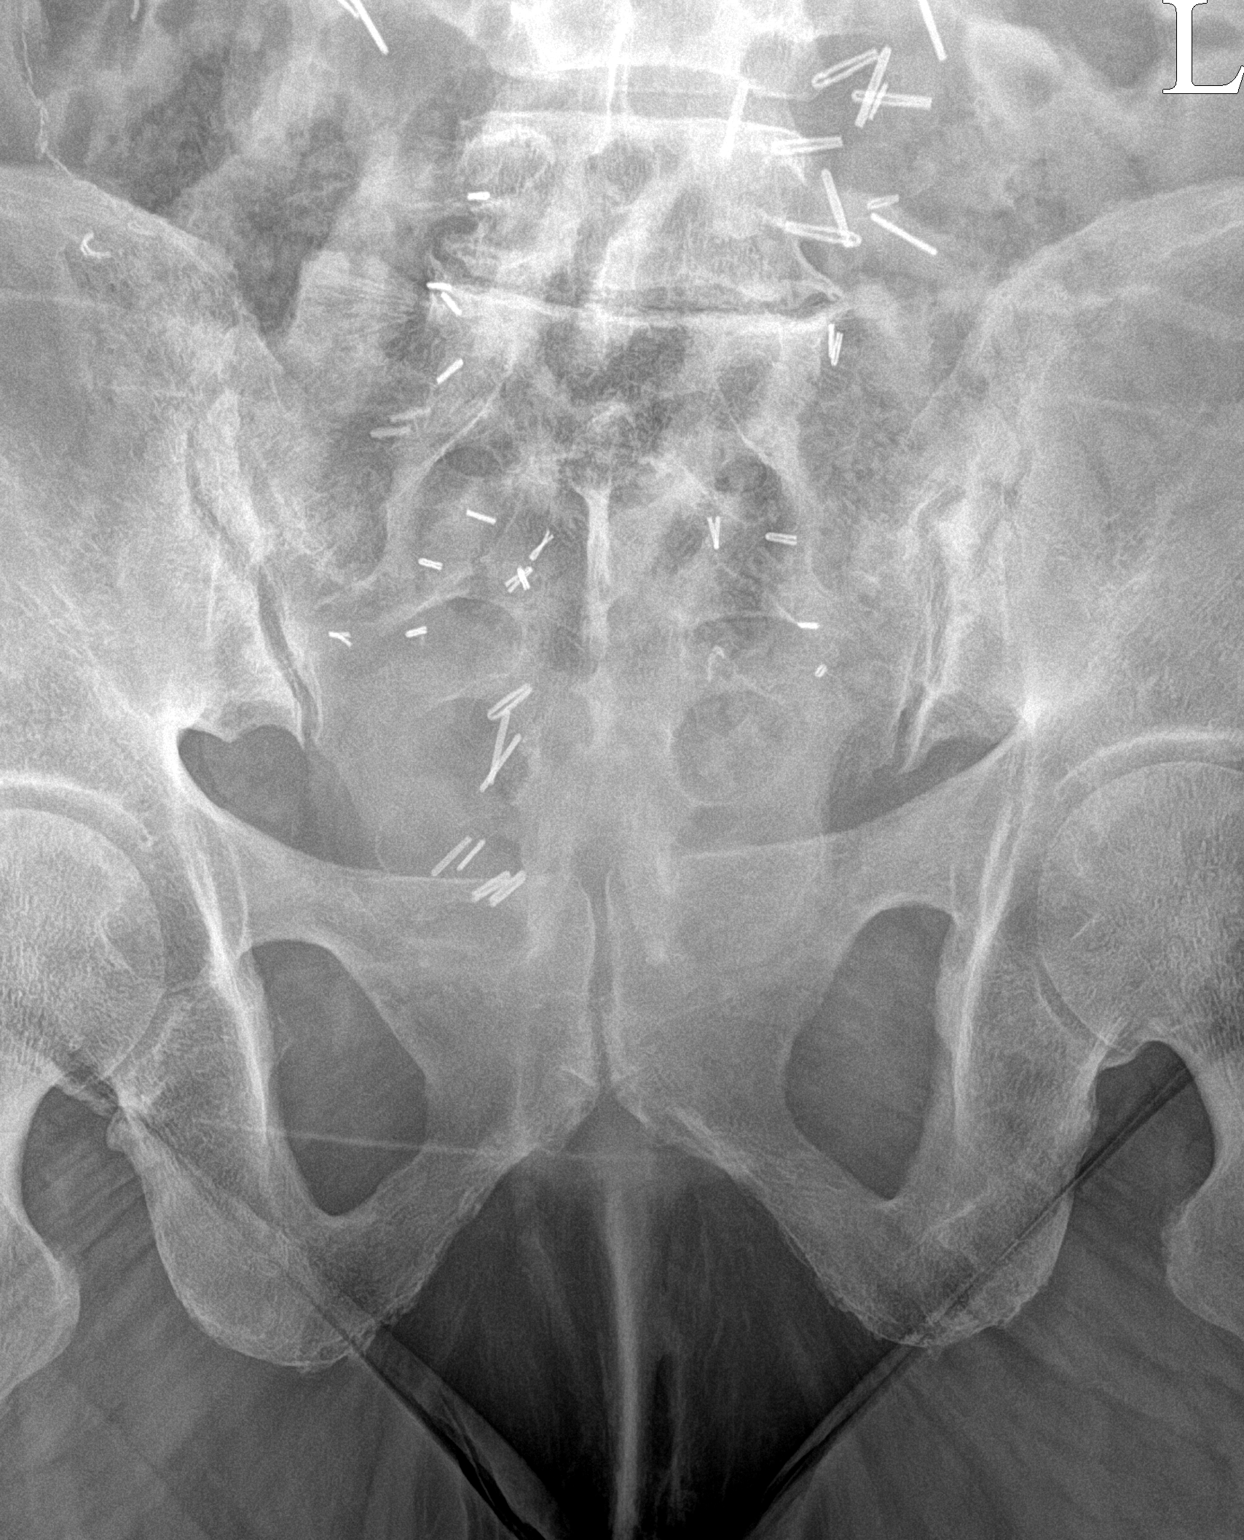

[coccyx ap]
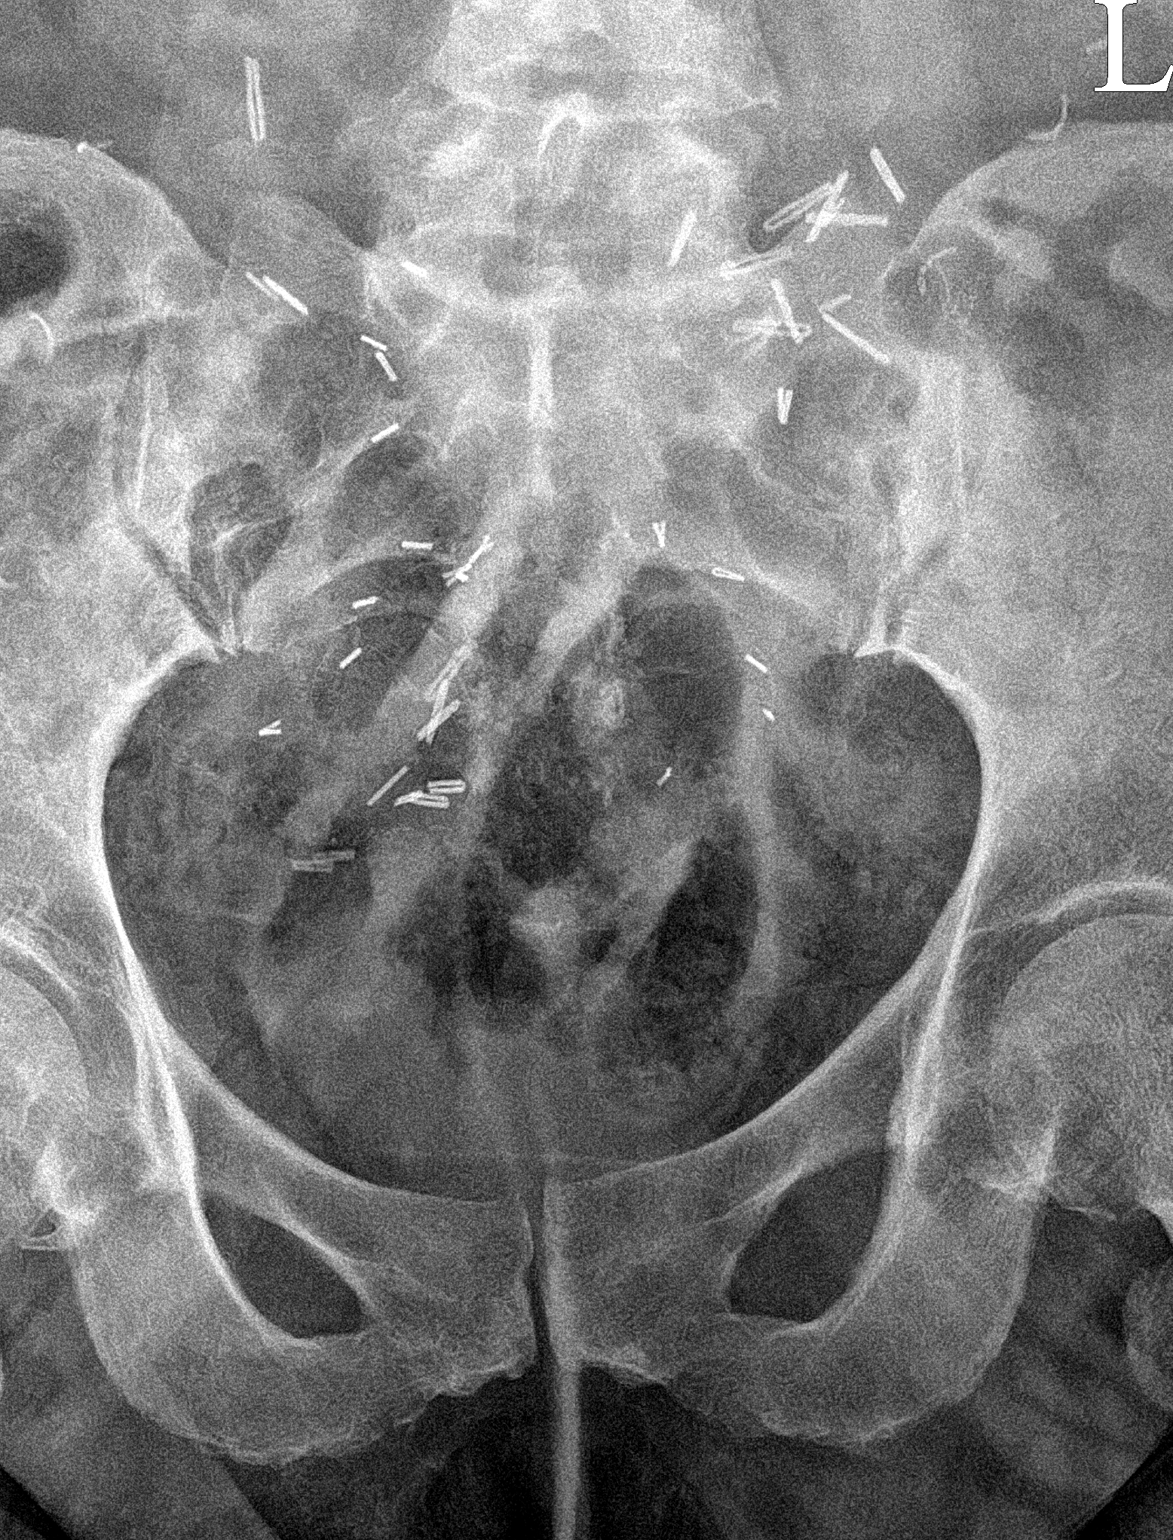

[sacrum lat]
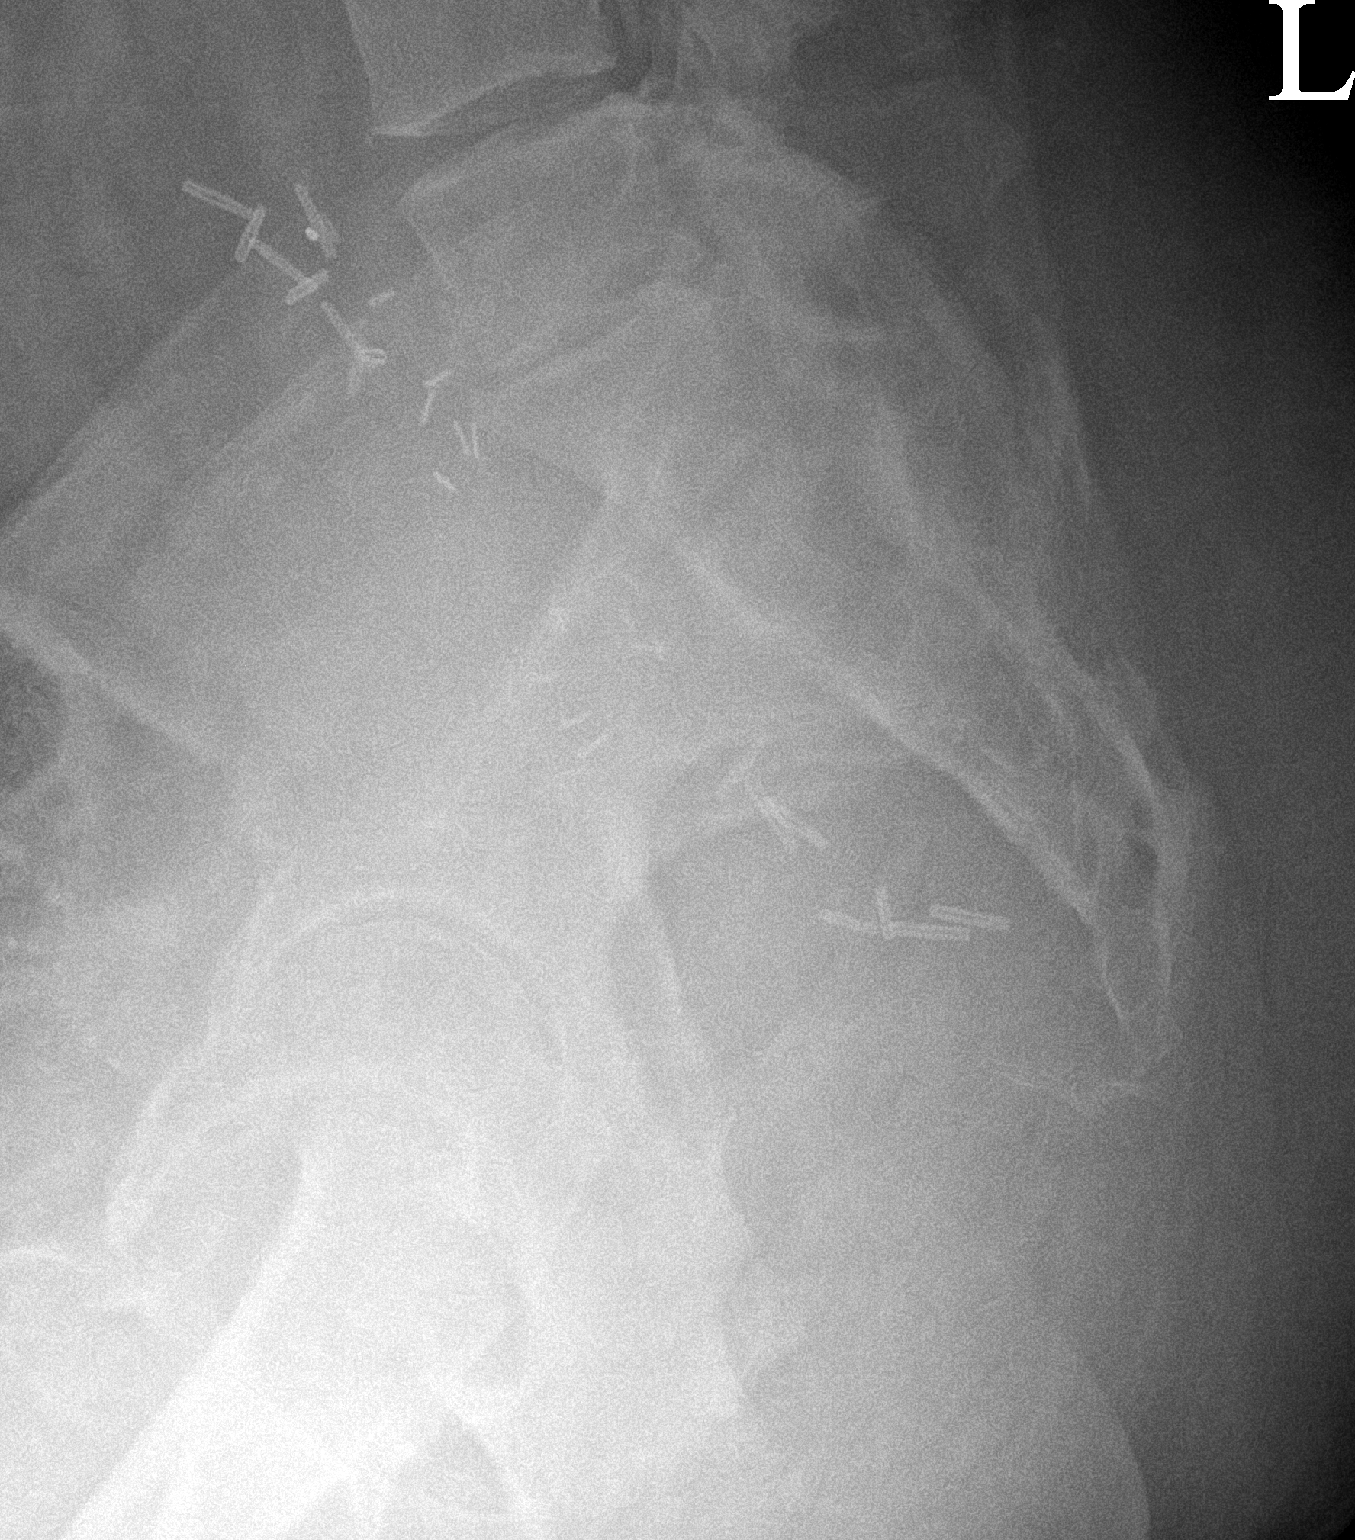

[3 of 3 positions shown; findings below may reference images not displayed]

FINDINGS: No definite displaced sacral or coccygeal fracture. Limited
visualization of the bilateral SI joints, hips and pubic symphysis
is normal.

Degenerative change of the lower lumbar spine is suspected though
incompletely evaluated.

Multiple surgical clips overlie the midline of the lower abdomen and
pelvis bilaterally.
IMPRESSION: 1. No definite displaced sacral or coccygeal fracture.
2. Degenerative change of the lower lumbar spine is suspected though
incompletely evaluated.

## 2022-04-14 IMAGING — DX DG LUMBAR SPINE 2-3V
3 series · 3 of 3 positions shown · non-contrast
Comparison: Sacral/coccygeal radiographs-earlier same day

CLINICAL DATA: Chronic low back pain radiating to the buttocks and
left hip. No known injury.

EXAM:
LUMBAR SPINE - 2-3 VIEW

[l-spine ap]
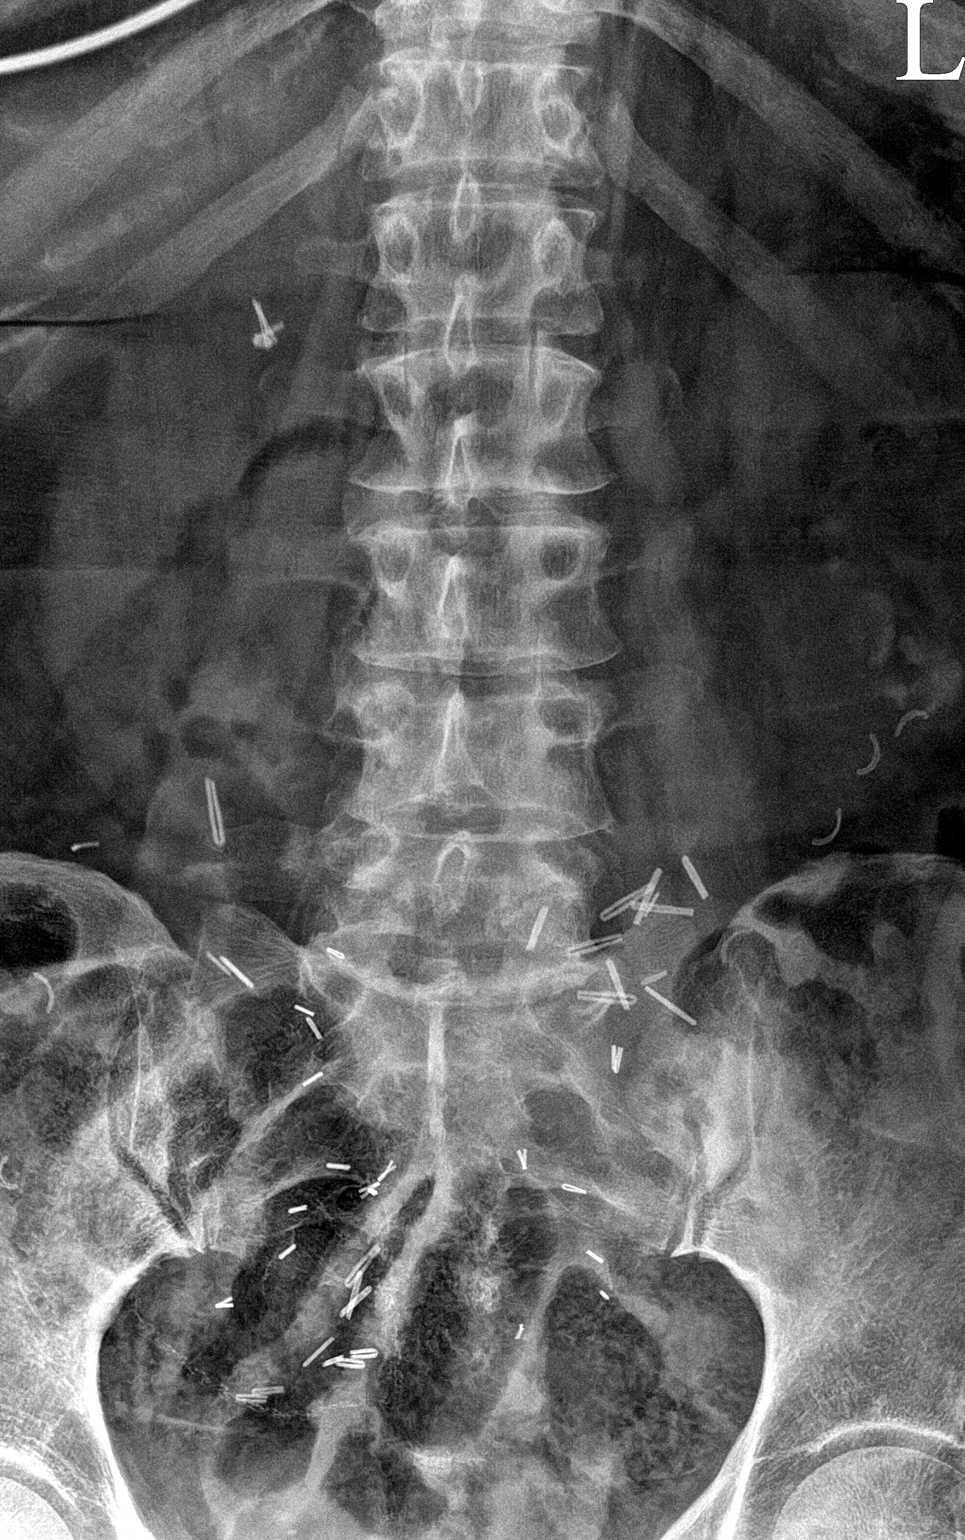

[l-spine lateral]
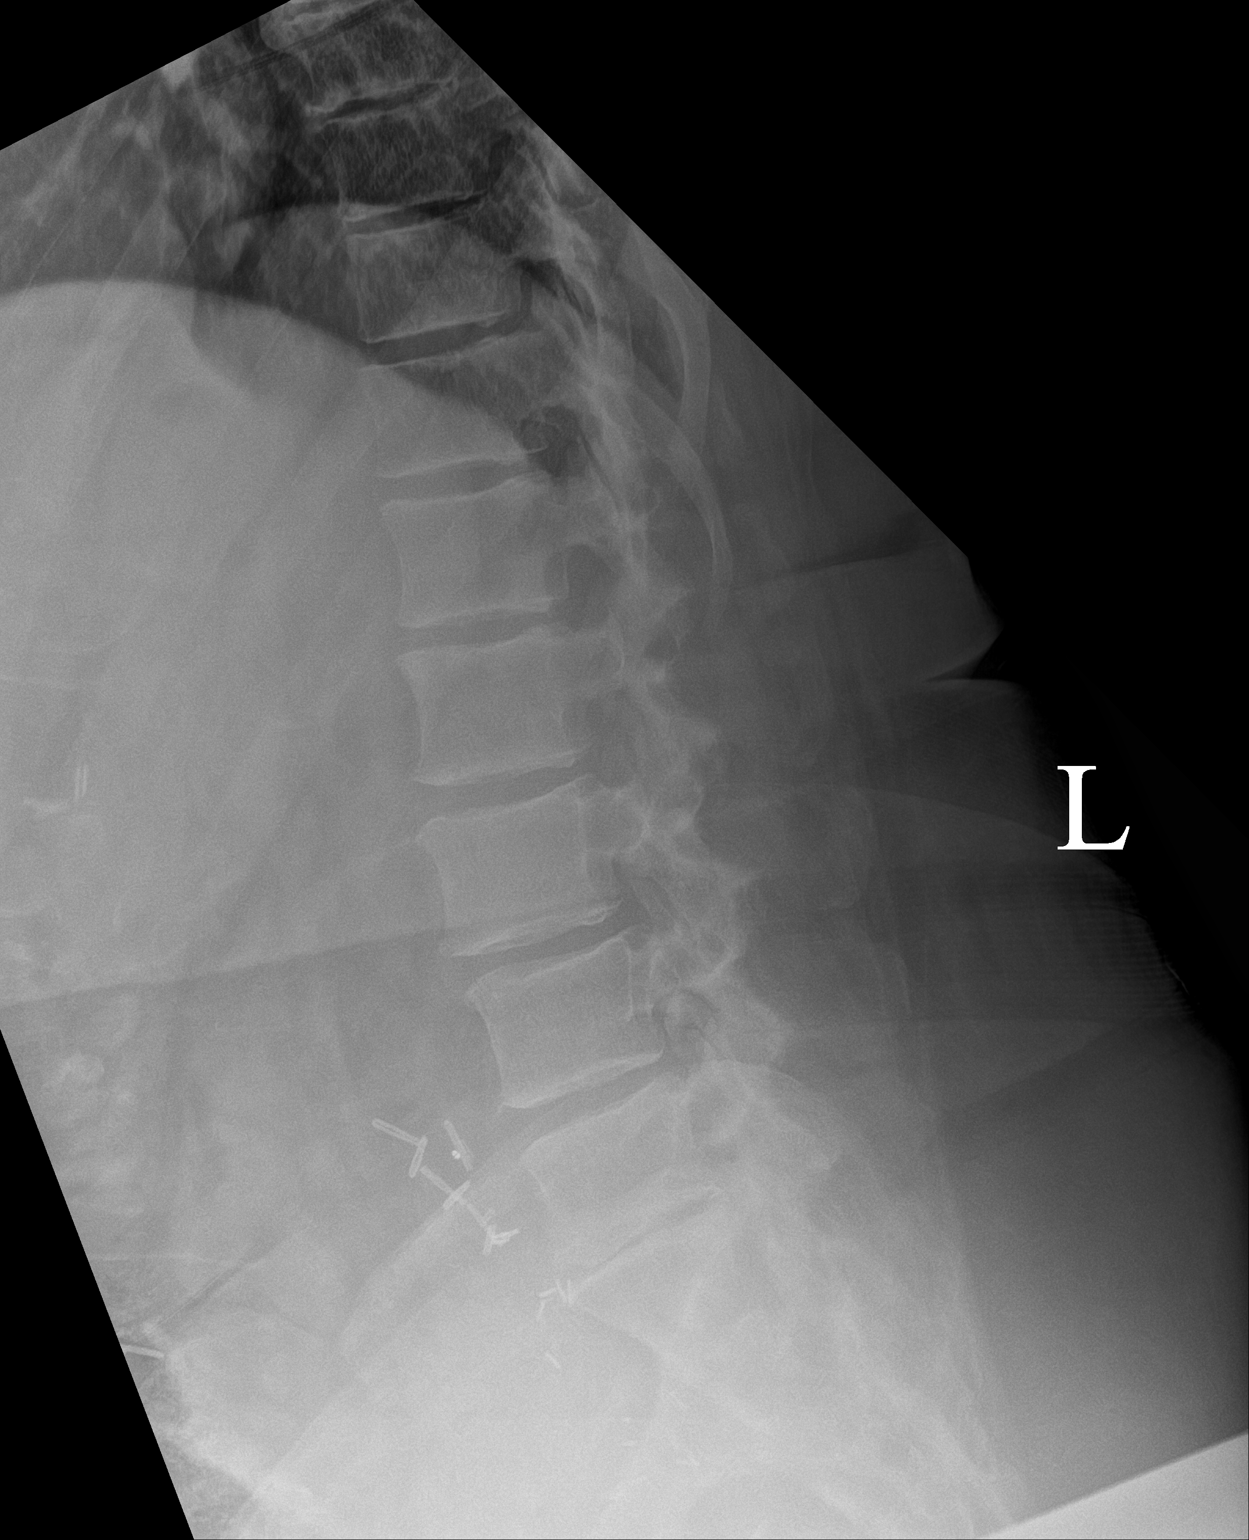

[l-spine spot]
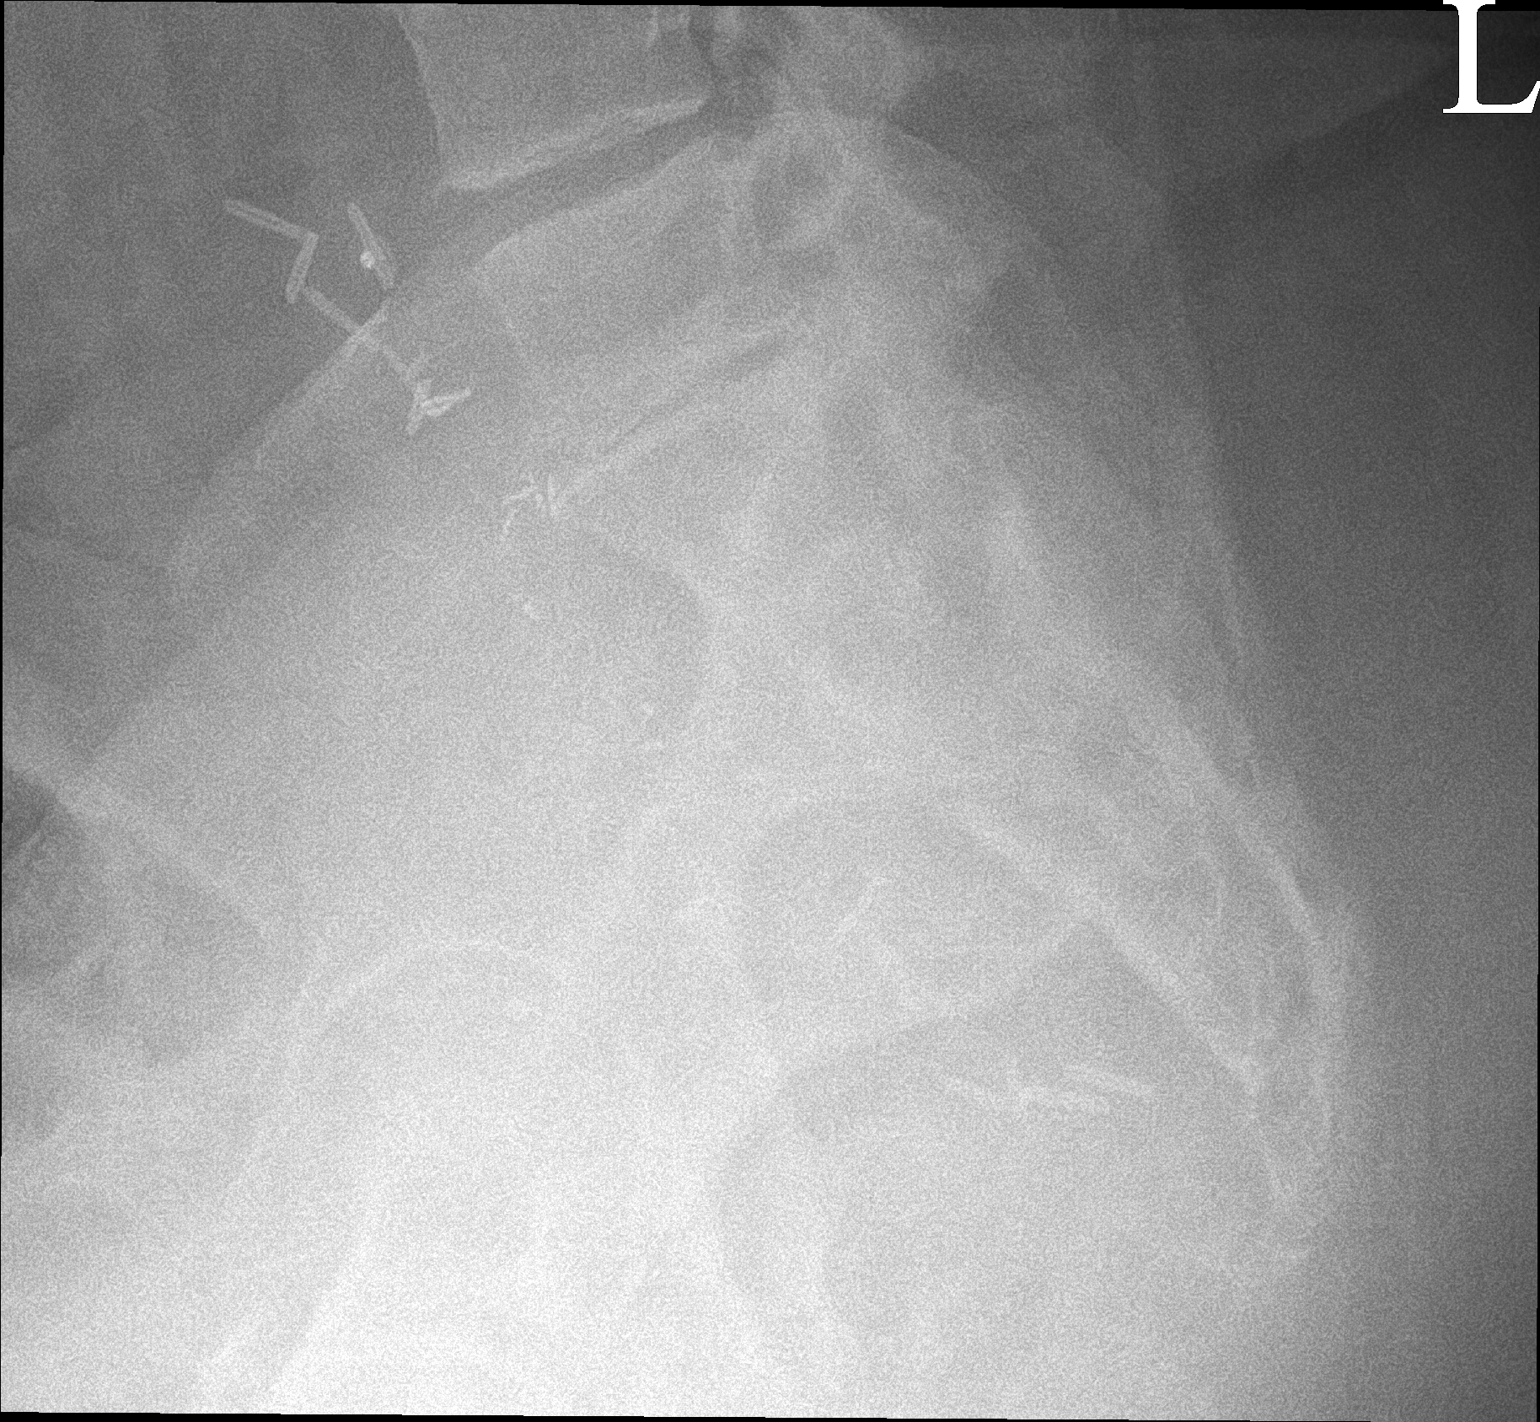

[3 of 3 positions shown; findings below may reference images not displayed]

FINDINGS: There are 5 non rib-bearing lumbar type vertebral bodies

Grade 1 anterolisthesis of L3 upon L4 measuring 4 mm. No
retrolisthesis. No significant scoliotic curvature.

Lumbar vertebral body heights appear preserved.

Mild to moderate multilevel lumbar spine DDD, worse at L5-S1 and to
a lesser extent, L3-L4 with disc space height loss, endplate
irregularity and sclerosis.

Limited visualization of the bilateral SI joints is normal.

Multiple surgical clips overlie the lower abdomen and pelvis
bilaterally. Post cholecystectomy. Nonobstructive bowel gas pattern.
IMPRESSION: Mild-to-moderate multilevel lumbar spine DDD, worse at L5-S1.

## 2022-04-23 ENCOUNTER — Encounter: Payer: Self-pay | Admitting: Family Medicine

## 2022-04-23 ENCOUNTER — Telehealth (INDEPENDENT_AMBULATORY_CARE_PROVIDER_SITE_OTHER): Payer: BC Managed Care – PPO | Admitting: Family Medicine

## 2022-04-23 VITALS — BP 124/68 | HR 68 | Resp 20

## 2022-04-23 DIAGNOSIS — U071 COVID-19: Secondary | ICD-10-CM | POA: Diagnosis not present

## 2022-04-23 MED ORDER — FLUTICASONE PROPIONATE 50 MCG/ACT NA SUSP: 2.0000 | Freq: Every day | NASAL | 6 refills | Status: DC

## 2022-04-23 MED ORDER — MOLNUPIRAVIR EUA 200MG CAPSULE: 4.0000 | ORAL_CAPSULE | Freq: Two times a day (BID) | ORAL | 0 refills | Status: AC

## 2022-04-23 MED ORDER — HYDROCODONE BIT-HOMATROP MBR 5-1.5 MG/5ML PO SOLN: 5.0000 mL | Freq: Four times a day (QID) | ORAL | 0 refills | Status: DC | PRN

## 2022-04-23 NOTE — Progress Notes (Signed)
Chief Complaint  Patient presents with   Covid Positive    Tested 9/21, symptoms started 09/20. Congestion, sneezing, coughing, sore throat, fever. Pt says she has poor kidney filtration and Paxlovid is not recommended.     [name] here for URI complaints. Due to COVID-19 pandemic, we are interacting via web portal for an electronic face-to-face visit. I verified patient's ID using 2 identifiers. Patient agreed to proceed with visit via this method. Patient is at home, I am at office. Patient and I are present for visit.   Duration: 2 days  Associated symptoms: Fever (99.8 F max), sinus congestion, rhinorrhea, itchy watery eyes, sore throat, and coughing, N/D, slight loss of taste/smell, sneezing Denies: ear pain, ear drainage, wheezing, shortness of breath, myalgia, and vomiting Treatment to date: cough drops Sick contacts: Yes- spouse Tested + for covid 04/22/22.  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(TAH w/ BSO) :  recurrent 11-19-2014 post pelvic surgery and radiation 01-29-2015 to 03-10-2015   Chronic idiopathic neutropenia ([place])    presumed related to chemotherapy March 2006--- followed by dr Alvy Bimler (treatment w/ G-CSF injections   Chronic nausea    CKD (chronic kidney disease), stage III [facility])    nephrologist-  dr Madelon Lips   Difficult intravenous access    small veins--- hx PICC lines   DM type 2 (diabetes mellitus, type 2) ([place])    endocriologist--   dr Dorothea Ogle [place]   (08-18-2021  per pt checks blood sugar daily in am,  fasting sugar--92--130)   Dysrhythmia    Environmental and seasonal allergies    Generalized muscle weakness    GERD (gastroesophageal reflux disease)    Hiatal hernia    History of abdominal abscess 04/16/2017   post surgery 04-01-2017  --- resolved 10/ 2018   History of COVID-19 05/2021   per pt mild symptoms that resolved   History of gastric polyp    2014  duodenum   History of ileus 04/16/2017   resolved w/ no surgical intervention   History of radiation therapy    01-29-2015 to 03-10-2015  pelvis 50.4Gy   History of small bowel obstruction 01/2019   Hypothyroidism    monitored by dr Legrand Como altheimer   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 Harlow Asa   PAF (paroxysmal atrial fibrillation) [facility])    cardiologist--- dr h. Tamala Julian;  event monitor 05/ 2021 in epic, NSR with 1% burden Afib, first degree heart blockl and PACs;  echo 04/ 2021 ef 55-60%, mild LVH   Peripheral neuropathy    PONV (postoperative nausea and vomiting)    "scopolamine patch works for me"   Presence of urostomy ([place]) 04/2019   Radiation-induced dermatitis    contact dermatitis , radiation completed, rash only on ankles now.   Vitamin D deficiency    Vulval lesion  Wears glasses     Objective BP 124/68   Pulse 68   Resp 20  No conversational dyspnea Age appropriate judgment and insight Nml affect and mood  COVID-19 - Plan: molnupiravir EUA (LAGEVRIO) 200 mg CAPS capsule, HYDROcodone bit-homatropine (HYCODAN) 5-1.5 MG/5ML syrup, fluticasone (FLONASE) 50 MCG/ACT nasal spray  Given renal function, will rx molnupiravir for 5 d as she did well with this historically. Cough syrup as above, warned about drowsiness, she has been on before and did well. Flonase for nasal congestion. Continue to push fluids,  practice good hand hygiene, cover mouth when coughing. CDC quarantining guidelines discussed.  F/u prn. If starting to experience irreplaceable fluid loss, shaking, or shortness of breath, seek immediate care. Pt voiced understanding and agreement to the plan.  [place], DO 04/23/22 8:32 AM

## 2022-04-27 ENCOUNTER — Ambulatory Visit (INDEPENDENT_AMBULATORY_CARE_PROVIDER_SITE_OTHER): Payer: BC Managed Care – PPO | Admitting: Family Medicine

## 2022-05-18 ENCOUNTER — Other Ambulatory Visit (INDEPENDENT_AMBULATORY_CARE_PROVIDER_SITE_OTHER): Payer: Self-pay | Admitting: Family Medicine

## 2022-05-24 ENCOUNTER — Other Ambulatory Visit (HOSPITAL_BASED_OUTPATIENT_CLINIC_OR_DEPARTMENT_OTHER): Payer: Self-pay

## 2022-06-01 DIAGNOSIS — H2511 Age-related nuclear cataract, right eye: Secondary | ICD-10-CM | POA: Diagnosis not present

## 2022-06-01 DIAGNOSIS — H52201 Unspecified astigmatism, right eye: Secondary | ICD-10-CM | POA: Diagnosis not present

## 2022-06-02 ENCOUNTER — Telehealth: Payer: Self-pay | Admitting: Surgery

## 2022-06-02 NOTE — Telephone Encounter (Signed)
Patient called in stating she has found a new lump on her R breast and is unsure who she needs to see to have it evaluated. Patient advised to call her breast doctor for evaluation.   Patient also states she has red raised itchy bumps around her stoma site. She states Dr Berline Lopes told her previously that if she had any problems she could come in and Dr Berline Lopes would evaluate her and possibly order cultures and send in antibiotics if needed. Patient advised this would be communicated to Dr Berline Lopes and our office will call patient with her recommendations.   Patient verbalized understanding and had no further concerns at this time.

## 2022-06-03 ENCOUNTER — Encounter: Payer: Self-pay | Admitting: Gynecologic Oncology

## 2022-06-03 NOTE — Telephone Encounter (Signed)
Pt coming in tomorrow at 2:00pm

## 2022-06-04 ENCOUNTER — Inpatient Hospital Stay: Payer: BC Managed Care – PPO | Attending: Gynecologic Oncology | Admitting: Gynecologic Oncology

## 2022-06-04 ENCOUNTER — Inpatient Hospital Stay: Payer: BC Managed Care – PPO

## 2022-06-04 ENCOUNTER — Encounter: Payer: Self-pay | Admitting: Gynecologic Oncology

## 2022-06-04 VITALS — BP 118/66 | HR 73 | Temp 98.1°F | Resp 16 | Ht 61.54 in | Wt 216.2 lb

## 2022-06-04 DIAGNOSIS — Z90710 Acquired absence of both cervix and uterus: Secondary | ICD-10-CM | POA: Diagnosis not present

## 2022-06-04 DIAGNOSIS — N898 Other specified noninflammatory disorders of vagina: Secondary | ICD-10-CM | POA: Diagnosis not present

## 2022-06-04 DIAGNOSIS — R319 Hematuria, unspecified: Secondary | ICD-10-CM | POA: Diagnosis not present

## 2022-06-04 DIAGNOSIS — Z9013 Acquired absence of bilateral breasts and nipples: Secondary | ICD-10-CM | POA: Insufficient documentation

## 2022-06-04 DIAGNOSIS — R55 Syncope and collapse: Secondary | ICD-10-CM | POA: Insufficient documentation

## 2022-06-04 DIAGNOSIS — R310 Gross hematuria: Secondary | ICD-10-CM

## 2022-06-04 DIAGNOSIS — Z90722 Acquired absence of ovaries, bilateral: Secondary | ICD-10-CM | POA: Insufficient documentation

## 2022-06-04 DIAGNOSIS — Z8542 Personal history of malignant neoplasm of other parts of uterus: Secondary | ICD-10-CM | POA: Diagnosis not present

## 2022-06-04 DIAGNOSIS — Z853 Personal history of malignant neoplasm of breast: Secondary | ICD-10-CM | POA: Diagnosis not present

## 2022-06-04 DIAGNOSIS — L089 Local infection of the skin and subcutaneous tissue, unspecified: Secondary | ICD-10-CM | POA: Diagnosis not present

## 2022-06-04 DIAGNOSIS — Z08 Encounter for follow-up examination after completed treatment for malignant neoplasm: Secondary | ICD-10-CM | POA: Diagnosis not present

## 2022-06-04 DIAGNOSIS — Z8543 Personal history of malignant neoplasm of ovary: Secondary | ICD-10-CM | POA: Insufficient documentation

## 2022-06-04 DIAGNOSIS — Z923 Personal history of irradiation: Secondary | ICD-10-CM | POA: Diagnosis not present

## 2022-06-04 DIAGNOSIS — N6310 Unspecified lump in the right breast, unspecified quadrant: Secondary | ICD-10-CM | POA: Insufficient documentation

## 2022-06-04 DIAGNOSIS — Z9221 Personal history of antineoplastic chemotherapy: Secondary | ICD-10-CM | POA: Insufficient documentation

## 2022-06-04 LAB — URINALYSIS, COMPLETE (UACMP) WITH MICROSCOPIC
Bilirubin Urine: NEGATIVE
Glucose, UA: NEGATIVE mg/dL
Ketones, ur: NEGATIVE mg/dL
Nitrite: NEGATIVE
Protein, ur: 100 mg/dL — AB
Specific Gravity, Urine: 1.011 (ref 1.005–1.030)
WBC, UA: >50 WBC/hpf — ABNORMAL HIGH (ref 0–5)
pH: 6 (ref 5.0–8.0)

## 2022-06-04 LAB — CMP (CANCER CENTER ONLY)
ALT: 14 U/L (ref 0–44)
AST: 12 U/L — ABNORMAL LOW (ref 15–41)
Albumin: 3.8 g/dL (ref 3.5–5.0)
Alkaline Phosphatase: 118 U/L (ref 38–126)
Anion gap: 6 (ref 5–15)
BUN: 33 mg/dL — ABNORMAL HIGH (ref 8–23)
CO2: 27 mmol/L (ref 22–32)
Calcium: 9.5 mg/dL (ref 8.9–10.3)
Chloride: 101 mmol/L (ref 98–111)
Creatinine: 2.2 mg/dL — ABNORMAL HIGH (ref 0.44–1.00)
GFR, Estimated: 24 mL/min — ABNORMAL LOW (ref >60)
Glucose, Bld: 113 mg/dL — ABNORMAL HIGH (ref 70–99)
Potassium: 4.5 mmol/L (ref 3.5–5.1)
Sodium: 134 mmol/L — ABNORMAL LOW (ref 135–145)
Total Bilirubin: 0.6 mg/dL (ref 0.3–1.2)
Total Protein: 7.9 g/dL (ref 6.5–8.1)

## 2022-06-04 LAB — CBC (CANCER CENTER ONLY)
HCT: 34.9 % — ABNORMAL LOW (ref 36.0–46.0)
Hemoglobin: 11.3 g/dL — ABNORMAL LOW (ref 12.0–15.0)
MCH: 30.1 pg (ref 26.0–34.0)
MCHC: 32.4 g/dL (ref 30.0–36.0)
MCV: 93.1 fL (ref 80.0–100.0)
Platelet Count: 184 10*3/uL (ref 150–400)
RBC: 3.75 MIL/uL — ABNORMAL LOW (ref 3.87–5.11)
RDW: 15.1 % (ref 11.5–15.5)
WBC Count: 8.2 10*3/uL (ref 4.0–10.5)
nRBC: 0 % (ref 0.0–0.2)

## 2022-06-04 MED ORDER — DOXYCYCLINE MONOHYDRATE 100 MG PO TABS: 100.0000 mg | ORAL_TABLET | Freq: Two times a day (BID) | ORAL | 0 refills | Status: AC

## 2022-06-04 NOTE — Patient Instructions (Signed)
It was good to see you today.  I will send you a message with your lab test from today.  I suspect that you are dehydrated and may be coming down with something.  Please make sure to drink plenty of fluids over the next couple of days.  Your urine culture will likely not be back until Sunday or early next week.  I will call if it looks like there is an infection that we need to treat.  I tend to prescription for doxycycline to your pharmacy to treat the skin around her stoma site.  Please let me know after you finish this if they are still lesions.  I will plan to see you in 6 months unless you have any concerns or new symptoms before then.

## 2022-06-04 NOTE — Progress Notes (Signed)
Gynecologic Oncology Return Clinic Visit  06/04/22  Reason for Visit: Surveillance in the setting of a complex history of both endometrial and ovarian cancer   Treatment History: Oncology History Overview Note  Breast cancer of upper-inner quadrant of left female breast [facility])   Staging form: Breast, AJCC 7th Edition     Clinical stage from 12/31/2015: Stage IIA (T2, N0, M0) - Signed by Heath Lark, MD on 12/31/2015  ER,PR positive Her2 neg   History of ovarian & endometrial cancer  10/15/2004 Initial Diagnosis   History of ovarian cancer, treated with chemotherapy carbo/Taxol   03/17/2009 Bone Marrow Biopsy   B[facility] showed neutropenia   01/23/2014 Bone Marrow Biopsy   Repeat bone marrow biopsy showed neutropenia   09/12/2014 Imaging   CT scan showed possible cancer   10/14/2014 Imaging   MRI show significant pelvic mass without invasion into the bladder but abutting to the rectum   11/19/2014 Surgery   she underwent surgery and had robotic-assisted lysis of adhesions, converted to laparotomy, radical upper vaginectomy and low anterior resection with colostomy. 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 Resection margins were negative   01/29/2015 - 03/10/2015 Radiation Therapy   She received adjuvant radiation   03/03/2015 Imaging   CT scan of the abdomen and pelvis showed status post interval removal of the bilateral nephroureteral stents. Worsening moderate right hydroureteronephrosis. Resolved left hydroureteronephrosis.   03/20/2015 Procedure   she had cystoscopy and stent placement for right hydronephrosis   05/02/2015 Surgery   Cystoscopy with right retrograde pyelogram interpretation. Diagnostic ureteroscopy. Removal of right ureteral stent.   05/14/2015  Imaging   CT scan of the abdomen and pelvis show unilateral right hydronephrosis with no residual cancer   08/18/2015 Imaging   CT scan showed hysterectomy with stable presacral soft tissue thickening. No definitive evidence of recurrent or metastatic disease. Severe right hydronephrosis   09/12/2015 Surgery   Cystoscopy with right retrograde pyelogram interpretation. Right ureteral stent placement 5 x 24 Polaris, no tether   10/07/2016 Imaging   Stable exam.  No new or progressive findings. 2. Stable appearance abnormal presacral soft tissue compatible with post treatment change. 3. Internal right ureteral stent with persistent right hydroureteronephrosis. Differentially decreased perfusion of the right kidney suggests a component of underlying obstructive uropathy. 4. Stable 13 mm left adrenal nodule, previously characterized as adenoma.       01/27/2017 Imaging   Stable postop and post radiation changes in presacral region. No evidence of recurrent or metastatic carcinoma within the chest, abdomen, or pelvis. Stable mild to moderate right hydroureteronephrosis, with right ureteral stent in appropriate position. Stable small benign left adrenal adenoma and left thyroid lobe nodule.   10/02/2019 Tumor Marker   Patient's tumor was tested for the following markers: CA-125 Results of the tumor marker test revealed 5.8   Breast cancer of upper-inner quadrant of left female breast ([place])  10/14/2015 Imaging   Screening mammogram showed possible distortion in the left breast.   10/24/2015 Imaging   Targeted ultrasound is performed, showing a 0.6 x 0.8 x 0.9 cm area of hypoechoic distortion at the 10 o'clock position of the left breast 5 cm from the nipple   10/24/2015 Imaging   Diagnostic imaging confirmed 0.6 x 0.8 x 0.9 cm distortion in the upper inner left breast   10/30/2015 Procedure  Left US guided biopsy was performed   10/30/2015 Pathology Results   Accession: [id number] showed invasive  ductal carcinoma, ER/PR positive, Her2 neg   11/05/2015 Genetic Testing   Genetic testing did not reveal a deleterious mutation.  Genetic testing did detect a Variant of Unknown Significance in the MSH6 gene called c.389A>G..  Genes tested include: APC, ATM, AXIN2, BARD1, BMPR1A, BRCA1, BRCA2, BRIP1, CDH1, CDKN2A, CHEK2, DICER1, EPCAM, GREM1, KIT, MEN1, MLH1, MSH2, MSH6, MUTYH, NBN, NF1, PALB2, PDGFRA, PMS2, POLD1, POLE, PTEN, RAD50, RAD51C, RAD51D, SDHA, SDHB, SDHC, SDHD, SMAD4, SMARCA4. STK11, TP53, TSC1, TSC2, and VHL.  UPDATE: MSH6 c.389A>G (p.His130Arg) has been reclassified as Likely Benign.  The amended report date is February 25, 2021.   12/11/2015 Surgery   She undwerwent bilateral mastectomy, left SLN biopsy and immediate reconstruction surgeries with bilateral plasma expanders   12/11/2015 Pathology Results   Accession: [id number] mastectomy specimens showed left breast ca, pT2N0M0   12/26/2015 Pathology Results   Accession: [id number] specimen from debridement showed no cancer   12/26/2015 Surgery   she underwent surgical debridement of necrotic tissue at site of plasma expander   01/12/2016 Imaging   Bone density is normal   02/02/2016 -  Anti-estrogen oral therapy   She started taking Arimidex   10/21/2017 Procedure   Ultrasound guided biopsy of a vague hypoechoic shadowing lesion, generally corresponding to area palpable abnormality, along chest lateral to the reconstructed left breast and inferior to the left axilla. No apparent complications.   01/17/2018 Imaging   Bone density scan is normal    She has a history of endometrial and ovarian endometrioid carcinoma treated in 2006 by Dr. [name] at [facility] in [place]. Her surgery (TAH, BSO) was followed by adjuvant chemotherapy with carboplatin plus paclitaxel due to the ovarian involvement and the presence of a cul de sac lesion also positive for disease. She denies receiving adjuvant radiation therapy. She  is unclear if she had metastatic endometrial cancer to the ovary or duel primaries. She had a complete response to therapy however developed leukopenia in July 2010. 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 She sees Dr. Alvy Bimler for this and is treated with G-CSF injections.   She began experiencing rectal pain approximately in January 2016. She also reports narrowing of caliber of the stool. She denies hematochezia. She does report approximately 3 months of vaginal spotting. She's had no specific follow-up for her gynecologic cancers in the past 4 years.   As part of workup of her rectal pain she underwent a CT scan of the abdomen and pelvis on 09/12/2014. This demonstrated a new right perirectal mass abutting the vaginal cuff measuring 3.8 x 4.9 cm. There is a limited fat plane between the mass and the rectum posteriorly. Rectal invasion could not be excluded. There were no other masses identified in the abdomen and pelvis or lymphadenopathy. There is no other evidence of metastatic disease or recurrent disease. There was no hydronephrosis. A CA-125 drawn on 09/12/2014 was normal at 10.   PET was negative for extrapelvic disease. MRI defined the lesion as a 3.5x5cm lesion to the right of the rectum at the vaginal cuff.   Colonoscopy was performed on 10/02/14 with transrectal [place] and biopsy and this revealed endometrioid adenocarcinoma. Of note, the lesion was not seen within the lumen of the rectum.    She then underwent a posterior supralevator exenteration with colostomy and bladder  repair and stent placement on 2/99/24 without complications with Dr Denman [name] and Dr Johney Maine.  Her postoperative course was uncomplicate with the exception of development of postop anemia.  Her final pathology revealed endometrioid adenocarcinoma invading the vagina and rectum with negative margins on the specimen.  The margin had been close (clinically) to the right pelvic sidewall which was marked with surgical clips.   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 It was treated with IV antibiotics and then on POD 15 she was taken to the OR for laparoscopic revision of the stoma with Dr Michael [name]. Postoperatively she had wound vac and packing for her stomal wound.   A retrograde cystogram on week 4 postop confirmed an intact bladder and the foley was removed.   She initially had some voiding issues with decreased sensation to void. We tested a post void residual in May, 2016 and this revealed adequate voiding.   On 12/30/14 she was admitted to [facility] with sepsis associated with Enterobacter Cloacae UTI. This was treated with IV antibiotics and then a prolonged course of oral cipro. Imaging performed at the time of admission (a CT of the abdo/pelvis) revealed: Bilateral double-J internal ureteral stents in adequate position with persistent mild bilateral hydronephrosis    She complete radiation therapy from 01/29/15 to 03/10/15 with 50Gy of external beam radiation and IMRT. She tolerated therapy well with minor skin irritation.   She had her ureteral stents removed in June, 2016, however, then developed right ureteral obstruction, hydroureter and pain. She went to the OR with Urologist Dr Tresa Moore on 03/20/15 for ureteral dilation and right stent placement (the left was draining well). No tumor was seen on cysto.    CT abdo/pelvis on 05/14/15 showed: no new lesions, stable thickening in right pelvis/distal right ureter consistent with radiation effect. The right ureteral stent was removed.   The patient's CT in January 2017 showed progression of her right ureteral obstruction after stent removal. Dr Tresa Moore took her to the OR on 09/05/15 for a cystoscopy, retrograde pyelogram and right ureteral stent placement.    The CT on 08/18/15 showed  decreased attenuation of the right renal parenchyma, right hydronephrosis that was severe no excretion of contrast on that for graphic phase imaging. The perivascular soft tissue thickening in the pelvis and presacral regions grossly stable consistent with radiation changes.   The patient was diagnosed with ER/PR positive left breast cancer in April 2017. Surgery is scheduled for May. She underwent bilateral mastectomy with expander reconstruction. Postoperatively she was treated with Arimidex.    Repeat CT imaging on 02/08/16 revealed : Stable post treatment changes in presacral region. No evidence of recurrent or metastatic carcinoma within the abdomen or pelvis. Interval placement of right ureteral stent in appropriate position. Moderate right hydroureteronephrosis remains stable. Stable benign left adrenal adenoma and hepatic steatosis.   On 06/30/16 she had replacement of her right ureteral stent with Dr Tresa Moore.   On 10/07/16 CT surveillance was performed which showed: Stable exam.  No new or progressive findings. Stable appearance abnormal presacral soft tissue compatible with post treatment change. Internal right ureteral stent with persistent right hydroureteronephrosis. Differentially decreased perfusion of the right kidney suggests a component of underlying obstructive uropathy. Stable 13 mm left adrenal nodule, previously characterized as adenoma. A parastomal hernia was noted.   CT 01/27/17 showed stable findings with no evidence of recurrence. Persistent right hydroureter was again noted.  Her colostomy bothered her greatly and she hoped to be free of stomal appliances. She was frustrated with repetitive stent replacements and desired definitive reconstruction/reimplantation of the right ureter.     Given that a 2 year disease free interval had elapsed, in 04/01/17 Dr Tresa Moore and Dr Johney Maine returned to the operating room with Ms Vinton for a right ureteral resection, and bladder hitch with  reimplantation and reversal of hartman's with temporary ileostomy complicated by postop development of urinoma which required a peritoneal drain and right ureteral stent.   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 She was offered permanent diversion with ileal conduit and permanent end colostomy. This was not considered a reasonable option at that time by [facility]. She became quite depressed by the slow recovery and sequelae of her last surgery. She sought second opinion with Gynecologic Oncology at [facility]. They offered referral to urology and colorectal specialists at [facility]. They recommended no additional cancer treatment as she was disease free. She is being worked up by them for possible laparotomy, revision of right uretero-neocystotomy, placement of VRAM flap, and possible bowel resection and revision of ileostomy. Prior to embarking on this surgery they requested she lose weight to a BMI of <30kg/m2.   She had lost weight to a BMI <30 throughout 2020. She experienced some episodes of passage of bloody mucous from the vagina in May, 2020 and so an CT scan was performed which showed no signs of pelvic recurrence.    In September the patient embarked on a large procedure at [facility].  She underwent an exploratory laparotomy panniculectomy cystectomy creation of urinary conduit with ileum, proctectomy, and formation of permanent and colostomy.  The procedure was uncomplicated.  She reported that while plastic surgery had planned to take part in the procedure with placement of a flap, it was determined that this was not necessary intraoperatively.  While the procedure note in Care Everywhere states that a hysterectomy was performed, this had already been performed more than a decade prior.  Additionally there was no myocutaneous flap placed despite the procedure  reporting as such.  No malignancy was identified in the specimens.   The patient had an uncomplicated procedure overall and a 7-day hospital stay with subsequent rehab.  She developed some wound healing issues at her colostomy site.   She is struggled with vaginal drainage and bleeding since.   She was seen in our clinic by Dr. Denman [name] in June 2021.  In the setting of continued vaginal bleeding and drainage, she sought consultation at [facility] with Dr. Fransisca Connors.  On exam, vagina noted to be atrophic.  Blood was seen from the apex of the vaginal vault and on bimanual exam the vault and apex extended posteriorly towards the sacrum without an obvious defect.  Given some concern for possibility of osteomyelitis this driving this drainage, MRI was performed.  MRI on 09/27/2020 showed a 2.3 x 1.9 x 3.3 cm fluid collection that appears contiguous with the rectal pouch via thickened fluid-filled tract and questionable thin fistulous tract anteriorly to the vaginal cuff.  Marked presacral soft tissue thickening and stranding likely reflecting posttreatment changes noted.  No definite evidence of local recurrence.   08/19/21: Excision of 1 cm left upper vulva/groin mass, Examiner anesthesia, vulvar biopsies.  Pathology revealed benign epidermal inclusion cyst.  Vaginal biopsy showed ulcerated squamous mucosa with lymphoplasmacytic infiltrate, no dysplasia or malignancy.  08/25/21: CT of the abdomen and pelvis showed no acute findings.  No evidence of recurrent or metastatic disease.  Stable small benign left adrenal adenoma.  Interval History: Patient reports recently noticing lesions around her stoma as well as pruritus similar to when she has had skin infection before.  She shows me pictures of this today as she changed her colostomy bag this morning and does not have a replacement with her.  She describes having near syncope in the shower this morning.  She got very lightheaded after getting into the shower.  Denies  having a fall.  Thinks she may not have had enough water yesterday and today.  Has felt intermittently dizzy.  Continues to have some bleeding and discharge from her vagina as well as her rectum, denies any significant change in quantity.  Noticed some blood in her urostomy several days ago, this has resolved.  Thinks she may have a urine infection.    She has intermittent pain on the right near her stoma.  Recently noticed a breast lump on the right.  Has an appointment next week with her breast surgeon.  Past Medical/Surgical History: Past Medical History:  Diagnosis Date   Anemia in chronic kidney disease    Anticoagulant long-term use    eliquis--- managed by cardiology   Benign essential HTN    Breast cancer, left ([place]) 10/2015   oncologist--- dr Alvy Bimler--- 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+, HER2 negative/  12-11-2015 bilateral mastecotmy w/ reconstruction (no radiation and no chemo)   Cancer of corpus uteri, except isthmus ([place]) 09/2004   oncologist-- dr rossi/ dr gorsuch:/   dx endometroid endometrial & ovarian cancer s/p  chemotheapy and surgery(TAH w/ BSO) :  recurrent 11-19-2014 post pelvic surgery and radiation 01-29-2015 to 03-10-2015   Chronic idiopathic neutropenia ([place])    presumed related to chemotherapy March 2006--- followed by dr Alvy Bimler (treatment w/ G-CSF injections   Chronic nausea    CKD (chronic kidney disease), stage III [facility])    nephrologist-  dr Madelon Lips   Difficult intravenous access    small veins--- hx PICC lines   DM type 2 (diabetes mellitus, type 2) ([place])    endocriologist--  dr Dorothea Ogle [place]   (08-18-2021  per pt checks blood sugar daily in am,  fasting sugar--92--130)   Dysrhythmia    Environmental and seasonal allergies    Generalized muscle weakness    GERD (gastroesophageal reflux disease)    Hiatal hernia    History of abdominal abscess 04/16/2017   post surgery 04-01-2017  --- resolved 10/ 2018   History of  COVID-19 05/2021   per pt mild symptoms that resolved   History of gastric polyp    2014  duodenum   History of ileus 04/16/2017   resolved w/ no surgical intervention   History of radiation therapy    01-29-2015 to 03-10-2015  pelvis 50.4Gy   History of small bowel obstruction 01/2019   Hypothyroidism    monitored by dr Legrand [place] altheimer   Ileostomy in place [facility]) 04/01/2017   created at same time colostomy takedown./  permnant 09/ 2020   Mixed dyslipidemia    Multiple thyroid nodules    Managed by Dr. Harlow Asa   PAF (paroxysmal atrial fibrillation) [facility])    cardiologist--- dr h. Tamala Julian;  event monitor 05/ 2021 in epic, NSR with 1% burden Afib, first degree heart blockl and PACs;  echo 04/ 2021 ef 55-60%, mild LVH   Peripheral neuropathy  PONV (postoperative nausea and vomiting)    "scopolamine patch works for me"   Presence of urostomy ([place]) 04/2019   Radiation-induced dermatitis    contact dermatitis , radiation completed, rash only on ankles now.   Vitamin D deficiency    Vulval lesion    Wears glasses     Past Surgical History:  Procedure Laterality Date   APPENDECTOMY     BREAST RECONSTRUCTION WITH PLACEMENT OF TISSUE EXPANDER AND FLEX HD (ACELLULAR HYDRATED DERMIS) Bilateral 12/11/2015   Procedure: BILATERAL BREAST RECONSTRUCTION WITH PLACEMENT OF TISSUE EXPANDERS;  Surgeon: Irene Limbo, MD;  Location: [place];  Service: Plastics;  Laterality: Bilateral;   COLONOSCOPY WITH PROPOFOL N/A 08/21/2013   Procedure: COLONOSCOPY WITH PROPOFOL;  Surgeon: Cleotis Nipper, MD;  Location: [facility];  Service: Endoscopy;  Laterality: N/A;   COLOSTOMY TAKEDOWN N/A 12/04/2014   Procedure: LAPROSCOPIC LYSIS OF ADHESIONS, SPLENIC MOBILIZATION, RELOCATION OF COLOSTOMY, DEBRIDEMENT INITIAL COLOSTOMY SITE;  Surgeon: Michael [name], MD;  Location: [facility];  Service: General;  Laterality: N/A;   CYSTECTOMY W/ URETEROILEAL CONDUIT  04/12/2019   _0 ;   INTESTINAL ANASTOMOSIS,  PANNICULECTOMY, PELVIS EXENTENTION,  PARTIAL COLECTOMY REMOVAL ILEIUM WITH PERMNANT ILEOCOLOSTOMY   CYSTOGRAM N/A 06/01/2017   Procedure: CYSTOGRAM;  Surgeon: Alexis Frock, MD;  Location: [facility];  Service: Urology;  Laterality: N/A;   CYSTOSCOPY W/ RETROGRADES Right 11/21/2015   Procedure: CYSTOSCOPY WITH RETROGRADE PYELOGRAM;  Surgeon: Alexis Frock, MD;  Location: [facility];  Service: Urology;  Laterality: Right;   CYSTOSCOPY W/ URETERAL STENT PLACEMENT Right 11/21/2015   Procedure: CYSTOSCOPY WITH STENT REPLACEMENT;  Surgeon: Alexis Frock, MD;  Location: [facility];  Service: Urology;  Laterality: Right;   CYSTOSCOPY W/ URETERAL STENT PLACEMENT Right 03/10/2016   Procedure: CYSTOSCOPY WITH STENT REPLACEMENT;  Surgeon: Alexis Frock, MD;  Location: [facility];  Service: Urology;  Laterality: Right;   CYSTOSCOPY W/ URETERAL STENT PLACEMENT Right 06/30/2016   Procedure: CYSTOSCOPY WITH RETROGRADE PYELOGRAM/URETERAL STENT EXCHANGE;  Surgeon: Alexis Frock, MD;  Location: [facility];  Service: Urology;  Laterality: Right;   CYSTOSCOPY W/ URETERAL STENT PLACEMENT N/A 06/01/2017   Procedure: CYSTOSCOPY WITH EXAM UNDER ANESTHESIA;  Surgeon: Alexis Frock, MD;  [facility];  Service: Urology;  Laterality: N/A;   CYSTOSCOPY W/ URETERAL STENT PLACEMENT Right 08/17/2017   Procedure: CYSTOSCOPY WITH RETROGRADE PYELOGRAM/URETERAL STENT REMOVAL;  Surgeon: Alexis Frock, MD;  Location: [facility];  Service: Urology;  Laterality: Right;   CYSTOSCOPY WITH RETROGRADE PYELOGRAM, URETEROSCOPY AND STENT PLACEMENT Right 03/20/2015   Procedure: CYSTOSCOPY WITH RETROGRADE PYELOGRAM, URETEROSCOPY WITH BALLOON DILATION AND STENT PLACEMENT ON RIGHT;  Surgeon: Alexis Frock, MD;  Location: [facility];  Service: Urology;  Laterality: Right;   CYSTOSCOPY WITH RETROGRADE PYELOGRAM, URETEROSCOPY AND STENT PLACEMENT Right 05/02/2015   Procedure:  CYSTOSCOPY WITH RIGHT RETROGRADE PYELOGRAM,  DIAGNOSTIC URETEROSCOPY AND STENT PULL ;  Surgeon: Alexis Frock, MD;  Location: [facility];  Service: Urology;  Laterality: Right;   CYSTOSCOPY WITH RETROGRADE PYELOGRAM, URETEROSCOPY AND STENT PLACEMENT Right 09/05/2015   Procedure: CYSTOSCOPY WITH RETROGRADE PYELOGRAM,  AND STENT PLACEMENT;  Surgeon: Alexis Frock, MD;  [facility];  Service: Urology;  Laterality: Right;   CYSTOSCOPY WITH RETROGRADE PYELOGRAM, URETEROSCOPY AND STENT PLACEMENT Right 04/01/2017   Procedure: CYSTOSCOPY WITH RETROGRADE PYELOGRAM, URETEROSCOPY AND STENT PLACEMENT;  Surgeon: Alexis Frock, MD;  Location: [facility];  Service: Urology;  Laterality: Right;   CYSTOSCOPY WITH STENT PLACEMENT Right 10/27/2016   Procedure:  CYSTOSCOPY WITH STENT CHANGE and right retrograde pyelogram;  Surgeon: Alexis Frock, MD;  Location: [facility];  Service: Urology;  Laterality: Right;   EUS N/A 10/02/2014   Procedure: LOWER ENDOSCOPIC ULTRASOUND (EUS);  Surgeon: Arta Silence, MD;  Location: [facility];  Service: Endoscopy;  Laterality: N/A;   EXCISION SOFT TISSUE MASS RIGHT FOREMAN  12/08/2006   EYE SURGERY  as child   pytosis of eyelids repair   INCISION AND DRAINAGE OF WOUND Bilateral 12/26/2015   Procedure: DEBRIDEMENT OF BILATERAL MASTECTOMY FLAPS;  Surgeon: Irene Limbo, MD;  Location: [place];  Service: Plastics;  Laterality: Bilateral;   IR CV LINE INJECTION  05/31/2017   IR FLUORO GUIDE CV LINE LEFT  05/31/2017   IR FLUORO GUIDE CV LINE RIGHT  04/06/2017   IR FLUORO GUIDE CV MIDLINE PICC RIGHT  05/30/2017   IR NEPHROSTOGRAM LEFT INITIAL PLACEMENT  09/02/2017   IR NEPHROSTOGRAM LEFT THRU EXISTING ACCESS  11/29/2017   IR NEPHROSTOGRAM RIGHT INITIAL PLACEMENT  09/02/2017   IR NEPHROSTOGRAM RIGHT THRU EXISTING ACCESS  09/13/2017   IR NEPHROSTOGRAM RIGHT THRU EXISTING ACCESS  11/29/2017   IR NEPHROSTOMY EXCHANGE LEFT   11/28/2017   IR NEPHROSTOMY EXCHANGE LEFT  01/05/2018   IR NEPHROSTOMY EXCHANGE LEFT  02/16/2018   IR NEPHROSTOMY EXCHANGE LEFT  03/30/2018   IR NEPHROSTOMY EXCHANGE LEFT  05/12/2018   IR NEPHROSTOMY EXCHANGE LEFT  06/21/2018   IR NEPHROSTOMY EXCHANGE LEFT  08/04/2018   IR NEPHROSTOMY EXCHANGE LEFT  09/18/2018   IR NEPHROSTOMY EXCHANGE LEFT  10/09/2018   IR NEPHROSTOMY EXCHANGE LEFT  10/27/2018   IR NEPHROSTOMY EXCHANGE LEFT  11/21/2018   IR NEPHROSTOMY EXCHANGE LEFT  01/05/2019   IR NEPHROSTOMY EXCHANGE LEFT  02/15/2019   IR NEPHROSTOMY EXCHANGE LEFT  03/29/2019   IR NEPHROSTOMY EXCHANGE RIGHT  10/02/2017   IR NEPHROSTOMY EXCHANGE RIGHT  11/28/2017   IR NEPHROSTOMY EXCHANGE RIGHT  01/05/2018   IR NEPHROSTOMY EXCHANGE RIGHT  02/16/2018   IR NEPHROSTOMY EXCHANGE RIGHT  03/30/2018   IR NEPHROSTOMY EXCHANGE RIGHT  05/12/2018   IR NEPHROSTOMY EXCHANGE RIGHT  06/21/2018   IR NEPHROSTOMY EXCHANGE RIGHT  08/04/2018   IR NEPHROSTOMY EXCHANGE RIGHT  09/18/2018   IR NEPHROSTOMY EXCHANGE RIGHT  10/27/2018   IR NEPHROSTOMY EXCHANGE RIGHT  11/21/2018   IR NEPHROSTOMY EXCHANGE RIGHT  01/05/2019   IR NEPHROSTOMY EXCHANGE RIGHT  02/15/2019   IR NEPHROSTOMY EXCHANGE RIGHT  03/29/2019   IR NEPHROSTOMY PLACEMENT LEFT  10/02/2017   IR RADIOLOGIST EVAL & MGMT  05/03/2017   IR US GUIDE VASC ACCESS LEFT  05/31/2017   IR US GUIDE VASC ACCESS RIGHT  04/06/2017   IR US GUIDE VASC ACCESS RIGHT  05/30/2017   LAPAROSCOPIC CHOLECYSTECTOMY  1990   LIPOSUCTION WITH LIPOFILLING Bilateral 04/16/2016   Procedure: LIPOSUCTION WITH LIPOFILLING TO BILATERAL CHEST;  Surgeon: Irene Limbo, MD;  Location: [place];  Service: Plastics;  Laterality: Bilateral;   MASTECTOMY W/ SENTINEL NODE BIOPSY Bilateral 12/11/2015   Procedure: RIGHT PROPHYLACTIC MASTECTOMY, LEFT TOTAL MASTECTOMY WITH LEFT AXILLARY SENTINEL LYMPH NODE BIOPSY;  Surgeon: Stark Klein, MD;  Location: [place];  Service: General;   Laterality: Bilateral;   OSTOMY N/A 11/19/2014   Procedure: OSTOMY;  Surgeon: Michael [name], MD;  Location: [facility];  Service: General;  Laterality: N/A;   PROCTOSCOPY N/A 04/01/2017   Procedure: RIDGE PROCTOSCOPY;  Surgeon: Michael [name], MD;  Location: [facility];  Service: General;  Laterality: N/A;   REMOVAL  OF BILATERAL TISSUE EXPANDERS WITH PLACEMENT OF BILATERAL BREAST IMPLANTS Bilateral 04/16/2016   Procedure: REMOVAL OF BILATERAL TISSUE EXPANDERS WITH PLACEMENT OF BILATERAL BREAST IMPLANTS;  Surgeon: Irene Limbo, MD;  Location: [place];  Service: Plastics;  Laterality: Bilateral;   ROBOTIC ASSISTED LAP VAGINAL HYSTERECTOMY N/A 11/19/2014   Procedure: ROBOTIC LYSIS OF ADHESIONS, CONVERTED TO LAPAROTOMY RADICAL UPPER VAGINECTOMY,LOW ANTERIOR BOWEL RESECTION, COLOSTOMY, BILATERAL URETERAL STENT PLACEMENT AND CYSTONOMY CLOSURE;  Surgeon: Everitt Amber, MD;  Location: [facility];  Service: Gynecology;  Laterality: N/A;   TISSUE EXPANDER FILLING Bilateral 12/26/2015   Procedure: EXPANSION OF BILATERAL CHEST TISSUE EXPANDERS (60 mL- Right; 75 mL- Left);  Surgeon: Irene Limbo, MD;  Location: [place];  Service: Plastics;  Laterality: Bilateral;   TONSILLECTOMY     TOTAL ABDOMINAL HYSTERECTOMY  March 2006   [facility]   and Bilateral Salpingoophorectomy/  staging for Ovarian cancer/  an   VULVAR LESION REMOVAL N/A 08/19/2021   Procedure: VULVAR LESION with biopsies;  Surgeon: Lafonda Mosses, MD;  Location: [facility];  Service: Gynecology;  Laterality: N/A;   XI ROBOTIC ASSISTED LOWER ANTERIOR RESECTION N/A 04/01/2017   Procedure: XI ROBOTIC VS LAPAROSCOPIC COLOSTOMY TAKEDOWN WITH LYSIS OF ADHESIONS.;  Surgeon: Michael [name], MD;  Location: [facility];  Service: General;  Laterality: N/A;  ERAS PATHWAY    Family History  Problem Relation Age of Onset   Cancer Mother 76       stomach ca   Hypertension Mother    Cancer Father 1       prostate ca    Diabetes Father    Heart disease Father        CABG   Hypertension Father    Hyperlipidemia Father    Obesity Father    Breast cancer Maternal Aunt        dx in her 32s   Lymphoma Paternal Aunt    Brain cancer Paternal Grandfather    Ovarian cancer Other    Diabetes Sister    Hypertension Brother y-10   Heart disease Brother        CABG   Diabetes Brother     Social History   Socioeconomic History   Marital status: Married    Spouse name: Armed forces technical officer   Number of children: 1   Years of education: Not on file   Highest education level: Not on file  Occupational History   Occupation: retired Therapist, 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, sports - retired  Tobacco Use   Smoking status: Never   Smokeless tobacco: Never  Vaping Use   Vaping Use: Never used  Substance and Sexual Activity   Alcohol use: Yes    Comment: Occ   Drug use: No   Sexual activity: Not Currently  Other Topics Concern   Not on file  Social History Narrative   Exercise-- has not gotten back into it since cancer came back   Social Determinants of Health   Financial Resource Strain: Not on file  Food Insecurity: Not on file  Transportation Needs: Not on file  Physical Activity: Not on file  Stress: Not on file  Social Connections: Not on file    Current Medications:  Current Outpatient Medications:    aspirin EC 81 MG tablet, Take 1 tablet (81 mg total) by mouth daily. Swallow whole., Disp: 90 tablet, Rfl: 3   Biotin 5 MG TABS, Take 5 mg by mouth daily. , Disp: , Rfl:  buPROPion (WELLBUTRIN XL) 150 MG 24 hr tablet, Take 150 mg by mouth daily., Disp: , Rfl:    calcium citrate-vitamin D (CITRACAL+D) 315-200 MG-UNIT tablet, Take 1 tablet by mouth daily., Disp: , Rfl:    Cholecalciferol (VITAMIN D3) 10000 UNITS capsule, Take 10,000 Units by mouth See admin instructions. Take 1 tablet by mouth 43times per week., Disp: , Rfl:    clobetasol (OLUX) 0.05 % topical foam, Apply topically 2 (two) times daily., Disp: 50 g,  Rfl: 3   diphenhydrAMINE (BENADRYL) 25 mg capsule, Take 1 capsule (25 mg total) by mouth every 8 (eight) hours as needed for itching, allergies or sleep., Disp: 30 capsule, Rfl: 0   doxycycline (ADOXA) 100 MG tablet, Take 1 tablet (100 mg total) by mouth 2 (two) times daily for 10 days., Disp: 20 tablet, Rfl: 0   famotidine-calcium carbonate-magnesium hydroxide (PEPCID COMPLETE) 10-800-165 MG chewable tablet, Chew 1 tablet by mouth as needed., Disp: , Rfl:    ferrous sulfate 325 (65 FE) MG tablet, Take 1 tablet (325 mg total) by mouth at bedtime., Disp: 30 tablet, Rfl: 3   fluticasone (FLONASE) 50 MCG/ACT nasal spray, Place 2 sprays into both nostrils daily., Disp: 16 g, Rfl: 6   HYDROcodone bit-homatropine (HYCODAN) 5-1.5 MG/5ML syrup, Take 5 mLs by mouth every 6 (six) hours as needed for cough., Disp: 120 mL, Rfl: 0   levothyroxine (SYNTHROID, LEVOTHROID) 150 MCG tablet, Take 1 tablet (150 mcg total) by mouth daily before breakfast., Disp: 30 tablet, Rfl: 1   loratadine (CLARITIN) 10 MG tablet, Take 10 mg by mouth daily. , Disp: , Rfl:    MOUNJARO 7.5 MG/0.5ML Pen, Inject into the skin., Disp: , Rfl:    omega-3 acid ethyl esters (LOVAZA) 1 G capsule, Take 1 g by mouth 2 (two) times daily. , Disp: , Rfl:    Polyethyl Glycol-Propyl Glycol (SYSTANE OP), Place 1 drop into both eyes daily as needed (dry eyes). , Disp: , Rfl:    pregabalin (LYRICA) 50 MG capsule, TAKE 1 CAPSULE TWICE A DAY, Disp: 60 capsule, Rfl: 5   Prenatal Vit-Fe Fumarate-FA (PRENATAL VITAMIN PO), Take 1 capsule by mouth daily. Takes prenatal because there are no dyes in it, Disp: , Rfl:    Probiotic Product (ALIGN PO), Take 1 capsule by mouth daily., Disp: , Rfl:    rosuvastatin (CRESTOR) 10 MG tablet, Take 10 mg by mouth every evening., Disp: , Rfl:    Saccharomyces boulardii (FLORASTOR PO), Take 1 capsule by mouth at bedtime., Disp: , Rfl:    Tazarotene (ARAZLO) 0.045 % LOTN, Apply 1 application topically daily., Disp: 45 g, Rfl:  0   Tbo-Filgrastim (GRANIX) 480 MCG/0.8ML SOSY injection, 480 mcg subcutaneous injection every 6 days life long, Disp: 11.2 mL, Rfl: 11   tiZANidine (ZANAFLEX) 4 MG tablet, Take 0.5-1 tablets (2-4 mg total) by mouth every 8 (eight) hours as needed for muscle spasms., Disp: 180 tablet, Rfl: 3  Review of Systems: + Hematuria although resolved, rectal and vaginal bleeding, back pain, muscle pain/cramp, pruritus, dizziness. Denies appetite changes, fevers, chills, fatigue, unexplained weight changes. Denies hearing loss, neck lumps or masses, mouth sores, ringing in ears or voice changes. Denies cough or wheezing.  Denies shortness of breath. Denies chest pain or palpitations. Denies leg swelling. Denies abdominal distention, pain, blood in stools, constipation, diarrhea, nausea, vomiting, or early satiety. Denies pain with intercourse, dysuria, frequency, or incontinence. Denies hot flashes, pelvic pain.   Denies wounds. Denies headaches, numbness or seizures. Denies swollen  lymph nodes or glands, denies easy bruising or bleeding. Denies anxiety, depression, confusion, or decreased concentration.  Physical Exam: BP 118/66 Comment: recheck per Dr Berline Lopes  Pulse 73   Temp 98.1 F (36.7 C) (Oral)   Resp 16   Ht 5' 1.54" (1.563 m)   Wt 216 lb 3.2 oz (98.1 kg)   SpO2 100%   BMI 40.14 kg/m  General: Alert, oriented, no acute distress. HEENT: Normocephalic, atraumatic, sclera anicteric. Chest: Clear to auscultation bilaterally.  No wheezes or rhonchi. Cardiovascular: Regular rate and rhythm, no murmurs. Abdomen: Obese, soft, nontender.  Normoactive bowel sounds.  Ostomy bag in each lower quadrant, urine in the urostomy bag.  Around the left lower quadrant bag, at the inferior and right aspect, there are some hyperpigmented changes to the skin.   Extremities: Grossly normal range of motion.  Warm, well perfused.  No edema bilaterally. Lymphatics: No cervical, supraclavicular, or inguinal  adenopathy. GU: Normal appearing external female genitalia.  On speculum exam with a small speculum, vaginal mucosa is normal in appearance.  There is a ring of constricted tissue where the vagina narrows approximately 4 cm above the introitus, vaginal mucosa is healthy appearing.  No discharge or bleeding noted.  On bimanual exam, no masses or nodularity appreciated.  Narrowing of the vagina is palpated with cavity above this narrowing.  No nodularity on rectovaginal exam.  Laboratory & Radiologic Studies: None new  Assessment & Plan: [name] is a 66 y.o. woman with a history of recurrent endometrioid endometrial/ovarian cancer.   S/p exploratory laparotomy, posterior supralevator pelvic exenteration, end colostomy, bladder repair, ureteral stenting with complete resection and negative margins on 11/09/14. S/p adjuvant radiation completed 03/10/15. Disease free since this time.   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   Status post pelvic exenteration procedure at [facility] in September 2020 with proctectomy and cystectomy and formation of permanent colostomy and ileal conduit.  Vaginectomy not performed.   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 No specific follow-up or surveillance recommended by genetics counselor.   Plan: 1) Suspected skin infection around the stoma. Patient has developed similar symptoms and peristomal lesions before that have responded well to antibiotics.  Based on the pictures that she showed me today, I suspect that she has developed another soft tissue infection.  She does not have an extra stoma bag, so we did not remove her appliance for me to look at the skin directly.  I sent a prescription in for doxycycline to  her pharmacy.  I have asked her to call the office if she does not have improvement and resolution of her symptoms with the antibiotic therapy.  2) Near syncopal event with dizziness. Patient endorses symptoms that I think likely represent dehydration.  We will check labs today to assure not related to significant anemia or electrolyte disturbance.  I have encouraged her to drink water.  Recheck of blood pressure was 118/66.  Blood pressure today similar on both checks to what hers normally runs.  3) Possible urinary tract infection. Given recent hematuria, discussed sending urine to rule out urinary tract infection.  Ideally, this would be done through a catheterized specimen.  Because patient does not have an extra Stomabag, discussed taking sample through her bag.  This is not ideal since there can be contamination, despite  this being a new bag that she has not manipulated/drained since placing this morning.  4) History of recurrent endometrial/ovarian cancer: s/p complete resection with negative margins and s/p adjuvant radiation. No chemotherapy (in the recurrent setting) due to chronic idiopathic neutropenia. She has a history of primary adjuvant chemotherapy after her initial diagnosis of stage III endometrial cancer in 2006. CT imaging and extensive pelvic surgical evaluation in 2020 demonstrated complete clinical response. Imaging in early 2022 with no definitive evidence of recurrent disease. CT imaging was performed in early 2023 given abdominal symptoms.  This was negative for any evidence of disease.   Will continue annual surveillance exams until 2026.    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 She has persistent vaginal drainage despite cystectomy and total proctectomy. She patient continues to have vaginal and rectal bleeding. She's been previously counseled that in the  setting of vagina having been left in situ at the time of her exenteration in 04/2019, surgery to performed total vaginectomy and placement of myocutaneous flap would require another major surgery that would likely involve having her colostomy and ileal conduit having to be taken down and would be associated with significant morbidity.  Additionally there would be no guarantee that her tissues, severely damaged by radiation, would heal such as a second surgery and result in good outcomes. After the patient stopped her Eliquis, this significantly helped decrease her bleeding. Hyperbaric oxygen seem to help some also.  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  Had previously discussed with interventional radiology with recommendation against consideration of embolization or drain placement.   6) Neutropenia - Dr Alvy Bimler is managing this and we appreciate. Patient is on granix.    7) Breast cancer - history of stage IIA. ER/PR positive. Treated with surgery and arimidex.  Given new breast lump, patient is scheduled to see her breast surgeon next week.   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 Patient follows-up with Dr Cristina Gong for close colon cancer and upper GI cancer surveillance   I have asked the patient to call me if and when she develops recurrent increasing vaginal bleeding or discharge.  I think she likely is going to need intermittent courses of antibiotic therapy as this seems to be our best tool in treating her pelvic symptoms.   From a cancer standpoint, I will continue to see her every 6 months.  32 minutes of total time was spent for this patient encounter, including preparation, face-to-face counseling with the patient and coordination of care, and documentation of the encounter.  Jeral Pinch, MD  Division of Gynecologic Oncology  Department of Obstetrics and Gynecology  [facility]

## 2022-06-05 LAB — URINE CULTURE

## 2022-06-07 ENCOUNTER — Other Ambulatory Visit: Payer: Self-pay | Admitting: Plastic Surgery

## 2022-06-07 ENCOUNTER — Telehealth: Payer: Self-pay | Admitting: Surgery

## 2022-06-07 ENCOUNTER — Encounter: Payer: BC Managed Care – PPO | Admitting: Gynecologic Oncology

## 2022-06-07 DIAGNOSIS — Z853 Personal history of malignant neoplasm of breast: Secondary | ICD-10-CM | POA: Diagnosis not present

## 2022-06-07 DIAGNOSIS — Z923 Personal history of irradiation: Secondary | ICD-10-CM | POA: Diagnosis not present

## 2022-06-07 DIAGNOSIS — Z9013 Acquired absence of bilateral breasts and nipples: Secondary | ICD-10-CM | POA: Diagnosis not present

## 2022-06-07 DIAGNOSIS — N6310 Unspecified lump in the right breast, unspecified quadrant: Secondary | ICD-10-CM

## 2022-06-07 NOTE — Telephone Encounter (Signed)
Patient returned call regarding coming in to give additional urine specimen. Due to potential risks of catheterizing pouch, Dr Berline Lopes and Joylene John, NP opting to have the patient finish cycle of Doxycycline and then reevaluate. Patient verbalized understanding and stated her pharmacy [facility]) told her they did not have the doxycycline she needed. [facility] [address]. [facility]. Patient advised to call our office with any new or worsening symptoms and that we will follow up with her on Friday to see how she's doing. Patient verbalized understanding and had no other concerns at this time.

## 2022-06-07 NOTE — Telephone Encounter (Signed)
Called [facility] [address] to verify they have Doxycycline (Adoxa). Per pharmacy, this medication is available. Verbal order with read back given for Doxycycline (Adoxa) 1 tablet ('100mg'$ ) by mouth twice daily for 10 days, per Dr Berline Lopes and Joylene John, NP.

## 2022-06-07 NOTE — Telephone Encounter (Signed)
Called patient to follow up regarding her visit last week. LVM for patient to call back.

## 2022-06-15 DIAGNOSIS — E119 Type 2 diabetes mellitus without complications: Secondary | ICD-10-CM | POA: Diagnosis not present

## 2022-06-15 DIAGNOSIS — H2512 Age-related nuclear cataract, left eye: Secondary | ICD-10-CM | POA: Diagnosis not present

## 2022-06-15 DIAGNOSIS — H52202 Unspecified astigmatism, left eye: Secondary | ICD-10-CM | POA: Diagnosis not present

## 2022-06-15 DIAGNOSIS — H269 Unspecified cataract: Secondary | ICD-10-CM | POA: Diagnosis not present

## 2022-06-18 ENCOUNTER — Other Ambulatory Visit (INDEPENDENT_AMBULATORY_CARE_PROVIDER_SITE_OTHER): Payer: Self-pay | Admitting: Family Medicine

## 2022-06-18 DIAGNOSIS — F3289 Other specified depressive episodes: Secondary | ICD-10-CM

## 2022-06-22 ENCOUNTER — Ambulatory Visit (INDEPENDENT_AMBULATORY_CARE_PROVIDER_SITE_OTHER): Payer: BC Managed Care – PPO | Admitting: Family Medicine

## 2022-06-23 ENCOUNTER — Ambulatory Visit (INDEPENDENT_AMBULATORY_CARE_PROVIDER_SITE_OTHER): Payer: BC Managed Care – PPO | Admitting: Family Medicine

## 2022-06-30 DIAGNOSIS — Z933 Colostomy status: Secondary | ICD-10-CM | POA: Diagnosis not present

## 2022-06-30 DIAGNOSIS — Z86010 Personal history of colonic polyps: Secondary | ICD-10-CM | POA: Diagnosis not present

## 2022-06-30 DIAGNOSIS — K6389 Other specified diseases of intestine: Secondary | ICD-10-CM | POA: Diagnosis not present

## 2022-06-30 DIAGNOSIS — Z8719 Personal history of other diseases of the digestive system: Secondary | ICD-10-CM | POA: Diagnosis not present

## 2022-07-01 ENCOUNTER — Telehealth: Payer: Self-pay

## 2022-07-01 ENCOUNTER — Other Ambulatory Visit: Payer: Self-pay | Admitting: Gynecologic Oncology

## 2022-07-01 DIAGNOSIS — L089 Local infection of the skin and subcutaneous tissue, unspecified: Secondary | ICD-10-CM

## 2022-07-01 MED ORDER — DOXYCYCLINE HYCLATE 100 MG PO TABS: 100.0000 mg | ORAL_TABLET | Freq: Two times a day (BID) | ORAL | 0 refills | Status: AC

## 2022-07-01 NOTE — Telephone Encounter (Signed)
I spoke to patient this morning to follow up on recent symptoms.   Pt states she is much much better as far as the urine goes. She can tell it is lighter in color, not as cloudy with minimal "clumps". The rash is still there with smaller bumps. The rectal drainage is better but still there. She thinks one more round of the antibiotic Doxycycline '100mg'$  should do the trick and will clear everything up. She is hoping to be better before the holiday.   Pt would [facility]  [address]. (Pharmacy has been updated in chart)

## 2022-07-01 NOTE — Telephone Encounter (Signed)
Thanks for the update. Please let her know abx has been sent.

## 2022-07-01 NOTE — Telephone Encounter (Signed)
Pt's husband answered phone stating Ms.Vilardi was not there. I notified him of the abx being sent to the pharmacy. He voiced an understanding and stated he would tell Ronalda.

## 2022-07-05 DIAGNOSIS — Z933 Colostomy status: Secondary | ICD-10-CM | POA: Diagnosis not present

## 2022-07-05 DIAGNOSIS — Z4801 Encounter for change or removal of surgical wound dressing: Secondary | ICD-10-CM | POA: Diagnosis not present

## 2022-07-05 DIAGNOSIS — Z936 Other artificial openings of urinary tract status: Secondary | ICD-10-CM | POA: Diagnosis not present

## 2022-07-05 DIAGNOSIS — L89319 Pressure ulcer of right buttock, unspecified stage: Secondary | ICD-10-CM | POA: Diagnosis not present

## 2022-07-13 NOTE — Progress Notes (Signed)
Cardiology Office Note:    Date:  07/14/2022   ID:  [name], DOB [birth year], MRN [id number]  PCP:  Zola Button, Grayling Congress, DO  Cardiologist:  Lesleigh Noe, MD   Referring MD: Zola Button, Grayling Congress, *   Chief Complaint  Patient presents with   Atrial Fibrillation   Hyperlipidemia   Follow-up    History of Present Illness:    [name] is a 66 y.o. female with a hx of  DOE, PAF (1% burden), hyperlipidemia, DM II, essential hypertension, CKD 3, bleeding on Apixaban, and first degree AVB.   She feels good.  She denies palpitations or any cardiac complaint.  Reviewed the data from visit with Dr. Lalla Brothers who felt there was no convincing evidence of atrial fibs.  Eliquis was stopped.  She has purchased an Theme park manager.  No suggestion so far of recurrent A-fib.  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(TAH w/ BSO) :  recurrent 11-19-2014 post pelvic surgery and radiation 01-29-2015 to 03-10-2015   Chronic idiopathic neutropenia (HCC)    presumed related to chemotherapy March 2006--- followed by dr Bertis Ruddy (treatment w/ G-CSF injections   Chronic nausea    CKD (chronic kidney disease), stage III [facility])    nephrologist-  dr Bufford Buttner   Difficult intravenous access    small veins--- hx PICC lines   DM type 2 (diabetes mellitus, type 2) (HCC)    endocriologist--  dr Joselyn Glassman Janee Morn   (08-18-2021  per pt checks blood sugar daily in am,  fasting  sugar--92--130)   Dysrhythmia    Environmental and seasonal allergies    Generalized muscle weakness    GERD (gastroesophageal reflux disease)    Hiatal hernia    History of abdominal abscess 04/16/2017   post surgery 04-01-2017  --- resolved 10/ 2018   History of COVID-19 05/2021   per pt mild symptoms that resolved   History of gastric polyp    2014  duodenum   History of ileus 04/16/2017   resolved w/ no surgical intervention   History of radiation therapy    01-29-2015 to 03-10-2015  pelvis 50.4Gy   History of small bowel obstruction 01/2019   Hypothyroidism    monitored by dr Casimiro Needle altheimer   Ileostomy in place [facility]) 04/01/2017   created at same time colostomy takedown./  permnant 09/ 2020   Mixed dyslipidemia    Multiple thyroid nodules    Managed by Dr. Gerrit Friends   PAF (paroxysmal atrial fibrillation) [facility])    cardiologist--- dr h. 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   Vitamin D  deficiency    Vulval lesion    Wears glasses     Past Surgical History:  Procedure Laterality Date   APPENDECTOMY     BREAST RECONSTRUCTION WITH PLACEMENT OF TISSUE EXPANDER AND FLEX HD (ACELLULAR HYDRATED DERMIS) Bilateral 12/11/2015   Procedure: BILATERAL BREAST RECONSTRUCTION WITH PLACEMENT OF TISSUE EXPANDERS;  Surgeon: Glenna Fellows, MD;  [facility]: [facility];  Service: Plastics;  Laterality: Bilateral;   COLONOSCOPY WITH PROPOFOL N/A 08/21/2013   Procedure: COLONOSCOPY WITH PROPOFOL;  Surgeon: Florencia Reasons, MD;  [facility];  Service: Endoscopy;  Laterality: N/A;   COLOSTOMY TAKEDOWN N/A 12/04/2014   Procedure: LAPROSCOPIC LYSIS OF  ADHESIONS, SPLENIC MOBILIZATION, RELOCATION OF COLOSTOMY, DEBRIDEMENT INITIAL COLOSTOMY SITE;  Surgeon: Karie Soda, MD;  [facility];  Service: General;  Laterality: N/A;   CYSTECTOMY W/ URETEROILEAL CONDUIT  04/12/2019   @[facility] ;   INTESTINAL ANASTOMOSIS, PANNICULECTOMY, PELVIS EXENTENTION,  PARTIAL COLECTOMY REMOVAL ILEIUM WITH PERMNANT ILEOCOLOSTOMY   CYSTOGRAM N/A 06/01/2017   Procedure: CYSTOGRAM;  Surgeon: Sebastian Ache, MD;  [facility];  Service: Urology;  Laterality: N/A;   CYSTOSCOPY W/ RETROGRADES Right 11/21/2015   Procedure: CYSTOSCOPY WITH RETROGRADE PYELOGRAM;  Surgeon: Sebastian Ache, MD;  [facility];  Service: Urology;  Laterality: Right;   CYSTOSCOPY W/ URETERAL STENT PLACEMENT Right 11/21/2015   Procedure: CYSTOSCOPY WITH STENT REPLACEMENT;  Surgeon: Sebastian Ache, MD;  [facility];  Service: Urology;  Laterality: Right;   CYSTOSCOPY W/ URETERAL STENT PLACEMENT Right 03/10/2016   Procedure: CYSTOSCOPY WITH STENT REPLACEMENT;  Surgeon: Sebastian Ache, MD;  Location: [facility];  Service: Urology;  Laterality: Right;   CYSTOSCOPY W/ URETERAL STENT PLACEMENT Right 06/30/2016   Procedure: CYSTOSCOPY WITH RETROGRADE PYELOGRAM/URETERAL STENT EXCHANGE;  Surgeon: Sebastian Ache, MD;  Location: [facility];  Service: Urology;  Laterality: Right;   CYSTOSCOPY W/ URETERAL STENT PLACEMENT N/A 06/01/2017   Procedure: CYSTOSCOPY WITH EXAM UNDER ANESTHESIA;  Surgeon: Sebastian Ache, MD;  [facility];  Service: Urology;  Laterality: N/A;   CYSTOSCOPY W/ URETERAL STENT PLACEMENT Right 08/17/2017   Procedure: CYSTOSCOPY WITH RETROGRADE PYELOGRAM/URETERAL STENT REMOVAL;  Surgeon: Sebastian Ache, MD;  Location: [facility];  Service: Urology;  Laterality: Right;   CYSTOSCOPY WITH RETROGRADE PYELOGRAM, URETEROSCOPY AND STENT PLACEMENT Right 03/20/2015   Procedure: CYSTOSCOPY WITH RETROGRADE PYELOGRAM, URETEROSCOPY WITH  BALLOON DILATION AND STENT PLACEMENT ON RIGHT;  Surgeon: Sebastian Ache, MD;  Location: [facility];  Service: Urology;  Laterality: Right;   CYSTOSCOPY WITH RETROGRADE PYELOGRAM, URETEROSCOPY AND STENT PLACEMENT Right 05/02/2015   Procedure: CYSTOSCOPY WITH RIGHT RETROGRADE PYELOGRAM,  DIAGNOSTIC URETEROSCOPY AND STENT PULL ;  Surgeon: Sebastian Ache, MD;  Location: [facility];  Service: Urology;  Laterality: Right;   CYSTOSCOPY WITH RETROGRADE PYELOGRAM, URETEROSCOPY AND STENT PLACEMENT Right 09/05/2015   Procedure: CYSTOSCOPY WITH RETROGRADE PYELOGRAM,  AND STENT PLACEMENT;  Surgeon: Sebastian Ache, MD;  [facility];  Service: Urology;  Laterality: Right;   CYSTOSCOPY WITH RETROGRADE PYELOGRAM, URETEROSCOPY AND STENT PLACEMENT Right 04/01/2017   Procedure: CYSTOSCOPY WITH RETROGRADE PYELOGRAM, URETEROSCOPY AND STENT PLACEMENT;  Surgeon: Sebastian Ache, MD;  [facility];  Service: Urology;  Laterality: Right;   CYSTOSCOPY WITH STENT PLACEMENT Right 10/27/2016   Procedure: CYSTOSCOPY WITH STENT CHANGE and right retrograde pyelogram;  Surgeon: Sebastian Ache, MD;  Location: [facility];  Service: Urology;  Laterality: Right;   EUS N/A 10/02/2014   Procedure: LOWER ENDOSCOPIC ULTRASOUND (EUS);  Surgeon: Willis Modena,  MD;  [facility];  Service: Endoscopy;  Laterality: N/A;   EXCISION SOFT TISSUE MASS RIGHT FOREMAN  12/08/2006   EYE SURGERY  as child   pytosis of eyelids repair   INCISION AND DRAINAGE OF WOUND Bilateral 12/26/2015   Procedure: DEBRIDEMENT OF BILATERAL MASTECTOMY FLAPS;  Surgeon: Glenna Fellows, MD;  Location: [facility];  Service: Plastics;  Laterality: Bilateral;   IR CV LINE INJECTION  05/31/2017   IR FLUORO GUIDE CV LINE LEFT  05/31/2017   IR FLUORO GUIDE CV LINE RIGHT  04/06/2017   IR FLUORO GUIDE CV MIDLINE PICC RIGHT  05/30/2017   IR NEPHROSTOGRAM LEFT INITIAL PLACEMENT  09/02/2017   IR  NEPHROSTOGRAM LEFT THRU EXISTING ACCESS  11/29/2017   IR NEPHROSTOGRAM RIGHT INITIAL PLACEMENT  09/02/2017   IR NEPHROSTOGRAM RIGHT THRU EXISTING ACCESS  09/13/2017   IR NEPHROSTOGRAM RIGHT THRU EXISTING ACCESS  11/29/2017   IR NEPHROSTOMY EXCHANGE LEFT  11/28/2017   IR NEPHROSTOMY EXCHANGE LEFT  01/05/2018   IR NEPHROSTOMY EXCHANGE LEFT  02/16/2018   IR NEPHROSTOMY EXCHANGE LEFT  03/30/2018   IR NEPHROSTOMY EXCHANGE LEFT  05/12/2018   IR NEPHROSTOMY EXCHANGE LEFT  06/21/2018   IR NEPHROSTOMY EXCHANGE LEFT  08/04/2018   IR NEPHROSTOMY EXCHANGE LEFT  09/18/2018   IR NEPHROSTOMY EXCHANGE LEFT  10/09/2018   IR NEPHROSTOMY EXCHANGE LEFT  10/27/2018   IR NEPHROSTOMY EXCHANGE LEFT  11/21/2018   IR NEPHROSTOMY EXCHANGE LEFT  01/05/2019   IR NEPHROSTOMY EXCHANGE LEFT  02/15/2019   IR NEPHROSTOMY EXCHANGE LEFT  03/29/2019   IR NEPHROSTOMY EXCHANGE RIGHT  10/02/2017   IR NEPHROSTOMY EXCHANGE RIGHT  11/28/2017   IR NEPHROSTOMY EXCHANGE RIGHT  01/05/2018   IR NEPHROSTOMY EXCHANGE RIGHT  02/16/2018   IR NEPHROSTOMY EXCHANGE RIGHT  03/30/2018   IR NEPHROSTOMY EXCHANGE RIGHT  05/12/2018   IR NEPHROSTOMY EXCHANGE RIGHT  06/21/2018   IR NEPHROSTOMY EXCHANGE RIGHT  08/04/2018   IR NEPHROSTOMY EXCHANGE RIGHT  09/18/2018   IR NEPHROSTOMY EXCHANGE RIGHT  10/27/2018   IR NEPHROSTOMY EXCHANGE RIGHT  11/21/2018   IR NEPHROSTOMY EXCHANGE RIGHT  01/05/2019   IR NEPHROSTOMY EXCHANGE RIGHT  02/15/2019   IR NEPHROSTOMY EXCHANGE RIGHT  03/29/2019   IR NEPHROSTOMY PLACEMENT LEFT  10/02/2017   IR RADIOLOGIST EVAL & MGMT  05/03/2017   IR US GUIDE VASC ACCESS LEFT  05/31/2017   IR US GUIDE VASC ACCESS RIGHT  04/06/2017   IR US GUIDE VASC ACCESS RIGHT  05/30/2017   LAPAROSCOPIC CHOLECYSTECTOMY  1990   LIPOSUCTION WITH LIPOFILLING Bilateral 04/16/2016   Procedure: LIPOSUCTION WITH LIPOFILLING TO BILATERAL CHEST;  Surgeon: Glenna Fellows, MD;  Location: [facility];  Service: Plastics;  Laterality:  Bilateral;   MASTECTOMY W/ SENTINEL NODE BIOPSY Bilateral 12/11/2015   Procedure: RIGHT PROPHYLACTIC MASTECTOMY, LEFT TOTAL MASTECTOMY WITH LEFT AXILLARY SENTINEL LYMPH NODE BIOPSY;  Surgeon: Almond Lint, MD;  [facility];  Service: General;  Laterality: Bilateral;   OSTOMY N/A 11/19/2014   Procedure: OSTOMY;  Surgeon: Karie Soda, MD;  [facility];  Service: General;  Laterality: N/A;   PROCTOSCOPY N/A 04/01/2017   Procedure: RIDGE PROCTOSCOPY;  Surgeon: Karie Soda, MD;  [facility];  Service: General;  Laterality: N/A;   REMOVAL OF BILATERAL TISSUE EXPANDERS WITH PLACEMENT OF BILATERAL BREAST IMPLANTS Bilateral 04/16/2016   Procedure: REMOVAL OF BILATERAL TISSUE EXPANDERS WITH PLACEMENT OF BILATERAL BREAST IMPLANTS;  Surgeon: Glenna Fellows, MD;  Location: [facility];  Service: Plastics;  Laterality: Bilateral;   ROBOTIC ASSISTED LAP VAGINAL HYSTERECTOMY N/A 11/19/2014   Procedure: ROBOTIC LYSIS OF ADHESIONS, CONVERTED TO LAPAROTOMY RADICAL UPPER VAGINECTOMY,LOW ANTERIOR BOWEL RESECTION, COLOSTOMY, BILATERAL URETERAL STENT PLACEMENT AND CYSTONOMY CLOSURE;  Surgeon: Adolphus Birchwood, MD;  [facility];  Service: Gynecology;  Laterality: N/A;   TISSUE EXPANDER FILLING Bilateral 12/26/2015   Procedure: EXPANSION OF BILATERAL CHEST TISSUE EXPANDERS (60 mL- Right; 75 mL- Left);  Surgeon: Glenna Fellows, MD;  Location: [facility];  Service: Plastics;  Laterality: Bilateral;   TONSILLECTOMY     TOTAL ABDOMINAL HYSTERECTOMY  March 2006   [facility]   and Bilateral Salpingoophorectomy/  staging for Ovarian cancer/  an   VULVAR LESION REMOVAL N/A 08/19/2021   Procedure: VULVAR LESION with biopsies;  Surgeon: Carver Fila, MD;  Location: [facility];  Service: Gynecology;  Laterality: N/A;   XI ROBOTIC ASSISTED LOWER ANTERIOR RESECTION N/A 04/01/2017   Procedure: XI ROBOTIC VS LAPAROSCOPIC COLOSTOMY TAKEDOWN WITH LYSIS OF ADHESIONS.;   Surgeon: Karie Soda, MD;  [facility];  Service: General;  Laterality: N/A;  ERAS PATHWAY    Current Medications: Current Meds  Medication Sig   aspirin EC 81 MG tablet Take 1 tablet (81 mg total) by mouth daily. Swallow whole.   Biotin 5 MG TABS Take 5 mg by mouth daily.    buPROPion (WELLBUTRIN XL) 150 MG 24 hr tablet Take 150 mg by mouth daily.   calcium citrate-vitamin D (CITRACAL+D) 315-200 MG-UNIT tablet Take 1 tablet by mouth daily.   Cholecalciferol (VITAMIN D3) 10000 UNITS capsule Take 10,000 Units by mouth See admin instructions. Take 1 tablet by mouth 43times per week.   clobetasol (OLUX) 0.05 % topical foam Apply topically 2 (two) times daily.   diphenhydrAMINE (BENADRYL) 25 mg capsule Take 1 capsule (25 mg total) by mouth every 8 (eight) hours as needed for itching, allergies or sleep.   famotidine-calcium carbonate-magnesium hydroxide (PEPCID COMPLETE) 10-800-165 MG chewable tablet Chew 1 tablet by mouth as needed.   ferrous sulfate 325 (65 FE) MG tablet Take 1 tablet (325 mg total) by mouth at bedtime.   fluticasone (FLONASE) 50 MCG/ACT nasal spray Place 2 sprays into both nostrils daily.   HYDROcodone bit-homatropine (HYCODAN) 5-1.5 MG/5ML syrup Take 5 mLs by mouth every 6 (six) hours as needed for cough.   levothyroxine (SYNTHROID, LEVOTHROID) 150 MCG tablet Take 1 tablet (150 mcg total) by mouth daily before breakfast.   loratadine (CLARITIN) 10 MG tablet Take 10 mg by mouth daily.    MOUNJARO 10 MG/0.5ML Pen Inject 10 mg into the skin every 30 (thirty) days.   omega-3 acid ethyl esters (LOVAZA) 1 G capsule Take 1 g by mouth 2 (two) times daily.    Polyethyl Glycol-Propyl Glycol (SYSTANE OP) Place 1 drop into both eyes daily as needed (dry eyes).    pregabalin (LYRICA) 50 MG capsule TAKE 1 CAPSULE TWICE A DAY   Prenatal Vit-Fe Fumarate-FA (PRENATAL VITAMIN PO) Take 1 capsule by mouth daily. Takes prenatal because there are no dyes in it   Probiotic Product (ALIGN PO)  Take 1 capsule by mouth daily.   rosuvastatin (CRESTOR) 10 MG tablet Take 10 mg by mouth every evening.   Saccharomyces boulardii (FLORASTOR PO) Take 1 capsule by mouth at bedtime.   Tazarotene (ARAZLO) 0.045 % LOTN Apply 1 application topically daily.   Tbo-Filgrastim (GRANIX) 480 MCG/0.8ML SOSY injection 480 mcg subcutaneous injection every 6 days life long   tiZANidine (ZANAFLEX) 4 MG tablet  Take 0.5-1 tablets (2-4 mg total) by mouth every 8 (eight) hours as needed for muscle spasms.   XIFAXAN 550 MG TABS tablet Take 550 mg by mouth as needed.     Allergies:   Penicillins, Erythromycin, Tape, Trimethoprim, Ultram [tramadol], Zarxio [filgrastim], Cephalosporins, Fluconazole, Neomycin, Oxycodone, Pectin, Septra [sulfamethoxazole-trimethoprim], and Sulfa antibiotics   Social History   Socioeconomic History   Marital status: Married    Spouse name: Clinical research associate   Number of children: 1   Years of education: Not on file   Highest education level: Not on file  Occupational History   Occupation: retired Charity fundraiser from [employer]    Comment: L&D Charity fundraiser - retired  Tobacco Use   Smoking status: Never   Smokeless tobacco: Never  Vaping Use   Vaping Use: Never used  Substance and Sexual Activity   Alcohol use: Yes    Comment: Occ   Drug use: No   Sexual activity: Not Currently  Other Topics Concern   Not on file  Social History Narrative   Exercise-- has not gotten back into it since cancer came back   Social Determinants of Health   Financial Resource Strain: Not on file  Food Insecurity: Not on file  Transportation Needs: Not on file  Physical Activity: Not on file  Stress: Not on file  Social Connections: Not on file     Family History: The patient's family history includes Brain cancer in her paternal grandfather; Breast cancer in her maternal aunt; Cancer (age of onset: 62) in her mother; Cancer (age of onset: 95) in her father; Diabetes in her brother, father, and sister; Heart disease in  her brother and father; Hyperlipidemia in her father; Hypertension in her father and mother; Hypertension (age of onset: y-10) in her brother; Lymphoma in her paternal aunt; Obesity in her father; Ovarian cancer in an other family member.  ROS:   Please see the history of present illness.    No orthopnea, PND, or other problems.  All other systems reviewed and are negative.  EKGs/Labs/Other Studies Reviewed:    The following studies were reviewed today: No new data  EKG:  EKG not repeated  Recent Labs: 02/15/2022: TSH 2.53 06/04/2022: ALT 14; BUN 33; Creatinine 2.20; Hemoglobin 11.3; Platelet Count 184; Potassium 4.5; Sodium 134  Recent Lipid Panel    Component Value Date/Time   CHOL 77 02/15/2022 1020   CHOL 92 (L) 06/16/2020 0834   TRIG 82.0 02/15/2022 1020   HDL 44.60 02/15/2022 1020   HDL 49 06/16/2020 0834   CHOLHDL 2 02/15/2022 1020   VLDL 16.4 02/15/2022 1020   LDLCALC 16 02/15/2022 1020   LDLCALC 21 06/16/2020 0834   LDLCALC 13 01/18/2020 1440    Physical Exam:    VS:  BP 118/62   Pulse 62   Ht 5\' 1"  (1.549 m)   Wt 214 lb 9.6 oz (97.3 kg)   SpO2 98%   BMI 40.55 kg/m     Wt Readings from Last 3 Encounters:  07/14/22 214 lb 9.6 oz (97.3 kg)  06/04/22 216 lb 3.2 oz (98.1 kg)  03/22/22 214 lb (97.1 kg)     GEN: Obese. No acute distress HEENT: Normal NECK: No JVD. LYMPHATICS: No lymphadenopathy CARDIAC: No murmur. RRR no gallop, or edema. VASCULAR:  Normal Pulses. No bruits. RESPIRATORY:  Clear to auscultation without rales, wheezing or rhonchi  ABDOMEN: Soft, non-tender, non-distended, No pulsatile mass, MUSCULOSKELETAL: No deformity  SKIN: Warm and dry NEUROLOGIC:  Alert and oriented x  3 PSYCHIATRIC:  Normal affect   ASSESSMENT:    1. Paroxysmal atrial fibrillation (HCC)   2. Hyperlipidemia, unspecified hyperlipidemia type   3. Stage 3 chronic kidney disease, unspecified whether stage 3a or 3b CKD (HCC)   4. Type 2 diabetes mellitus with stage 4  chronic kidney disease, without long-term current use of insulin (HCC)   5. First degree AV block   6. Benign essential HTN    PLAN:    In order of problems listed above:  No convincing evidence of A-fib.  Therefore apixaban was discontinued by Dr. Lalla Brothers.  Monitoring A-fib burden with an Apple Watch. Continue therapy with statins Follow kidney function.  Consider SGLT2 therapy. Consider SGLT2 Did not do an EKG today. Well-controlled blood pressure on today's visit  She would like to follow-up with Dr. Lalla Brothers in 4 to 6 months to get further recommendations.  As a general cardiologist, recommended Dr. Pemberton/Skains/Chandrashekar.  Low up with primary cardiologist in 9 to 12 months.   Medication Adjustments/Labs and Tests Ordered: Current medicines are reviewed at length with the patient today.  Concerns regarding medicines are outlined above.  No orders of the defined types were placed in this encounter.  No orders of the defined types were placed in this encounter.   Patient Instructions  Medication Instructions:  Your physician recommends that you continue on your current medications as directed. Please refer to the Current Medication list given to you today.  *If you need a refill on your cardiac medications before your next appointment, please call your pharmacy*  Follow-Up: At [facility], you and your health needs are our priority.  As part of our continuing mission to provide you with exceptional heart care, we have created designated Provider Care Teams.  These Care Teams include your primary Cardiologist (physician) and Advanced Practice Providers (APPs -  Physician Assistants and Nurse Practitioners) who all work together to provide you with the care you need, when you need it.  Your next appointment:   4-6 month(s) with Dr. Lalla Brothers 6-9 months with Dr. Shari Prows  The format for your next appointment:   In Person  Provider:   Steffanie Dunn, MD in  4-6 months Laurance Flatten, MD in 6-9 months   Important Information About Sugar         Signed, Lesleigh Noe, MD  07/14/2022 2:50 PM    [facility]

## 2022-07-14 ENCOUNTER — Ambulatory Visit: Payer: BC Managed Care – PPO | Attending: Interventional Cardiology | Admitting: Interventional Cardiology

## 2022-07-14 ENCOUNTER — Encounter: Payer: Self-pay | Admitting: Interventional Cardiology

## 2022-07-14 VITALS — BP 118/62 | HR 62 | Ht 61.0 in | Wt 214.6 lb

## 2022-07-14 DIAGNOSIS — I10 Essential (primary) hypertension: Secondary | ICD-10-CM

## 2022-07-14 DIAGNOSIS — I480 Paroxysmal atrial fibrillation: Secondary | ICD-10-CM | POA: Diagnosis not present

## 2022-07-14 DIAGNOSIS — E785 Hyperlipidemia, unspecified: Secondary | ICD-10-CM

## 2022-07-14 DIAGNOSIS — N183 Chronic kidney disease, stage 3 unspecified: Secondary | ICD-10-CM | POA: Diagnosis not present

## 2022-07-14 DIAGNOSIS — E1122 Type 2 diabetes mellitus with diabetic chronic kidney disease: Secondary | ICD-10-CM

## 2022-07-14 DIAGNOSIS — N184 Chronic kidney disease, stage 4 (severe): Secondary | ICD-10-CM

## 2022-07-14 DIAGNOSIS — I440 Atrioventricular block, first degree: Secondary | ICD-10-CM

## 2022-07-14 NOTE — Patient Instructions (Signed)
Medication Instructions:  Your physician recommends that you continue on your current medications as directed. Please refer to the Current Medication list given to you today.  *If you need a refill on your cardiac medications before your next appointment, please call your pharmacy*  Follow-Up: At [facility], you and your health needs are our priority.  As part of our continuing mission to provide you with exceptional heart care, we have created designated Provider Care Teams.  These Care Teams include your primary Cardiologist (physician) and Advanced Practice Providers (APPs -  Physician Assistants and Nurse Practitioners) who all work together to provide you with the care you need, when you need it.  Your next appointment:   4-6 month(s) with Dr. Quentin Ore 6-9 months with Dr. Johney Frame  The format for your next appointment:   In Person  Provider:   Lars Mage, MD in 4-6 months Gwyndolyn Kaufman, MD in 6-9 months   Important Information About Sugar

## 2022-07-20 ENCOUNTER — Ambulatory Visit
Admission: RE | Admit: 2022-07-20 | Discharge: 2022-07-20 | Disposition: A | Discharge status: Home / Self Care | Payer: BC Managed Care – PPO | Source: Ambulatory Visit | Attending: Plastic Surgery | Admitting: Plastic Surgery

## 2022-07-20 DIAGNOSIS — N6310 Unspecified lump in the right breast, unspecified quadrant: Secondary | ICD-10-CM

## 2022-07-20 DIAGNOSIS — Z853 Personal history of malignant neoplasm of breast: Secondary | ICD-10-CM | POA: Diagnosis not present

## 2022-07-23 ENCOUNTER — Other Ambulatory Visit (INDEPENDENT_AMBULATORY_CARE_PROVIDER_SITE_OTHER): Payer: Self-pay | Admitting: Family Medicine

## 2022-07-28 ENCOUNTER — Telehealth (INDEPENDENT_AMBULATORY_CARE_PROVIDER_SITE_OTHER): Payer: BC Managed Care – PPO | Admitting: Family Medicine

## 2022-07-28 ENCOUNTER — Other Ambulatory Visit (INDEPENDENT_AMBULATORY_CARE_PROVIDER_SITE_OTHER): Payer: Self-pay | Admitting: Family Medicine

## 2022-07-28 DIAGNOSIS — E1169 Type 2 diabetes mellitus with other specified complication: Secondary | ICD-10-CM

## 2022-07-28 MED ORDER — MOUNJARO 10 MG/0.5ML ~~LOC~~ SOAJ: 10.0000 mg | SUBCUTANEOUS | 0 refills | Status: DC

## 2022-08-05 ENCOUNTER — Encounter (INDEPENDENT_AMBULATORY_CARE_PROVIDER_SITE_OTHER): Payer: Self-pay | Admitting: Family Medicine

## 2022-08-05 ENCOUNTER — Ambulatory Visit (INDEPENDENT_AMBULATORY_CARE_PROVIDER_SITE_OTHER): Payer: BC Managed Care – PPO | Admitting: Family Medicine

## 2022-08-05 VITALS — BP 123/66 | HR 71 | Temp 97.7°F | Ht 61.0 in | Wt 210.0 lb

## 2022-08-05 DIAGNOSIS — E1169 Type 2 diabetes mellitus with other specified complication: Secondary | ICD-10-CM | POA: Diagnosis not present

## 2022-08-05 DIAGNOSIS — E669 Obesity, unspecified: Secondary | ICD-10-CM

## 2022-08-05 DIAGNOSIS — R0602 Shortness of breath: Secondary | ICD-10-CM

## 2022-08-05 DIAGNOSIS — Z6839 Body mass index (BMI) 39.0-39.9, adult: Secondary | ICD-10-CM | POA: Diagnosis not present

## 2022-08-05 DIAGNOSIS — Z7985 Long-term (current) use of injectable non-insulin antidiabetic drugs: Secondary | ICD-10-CM

## 2022-08-05 MED ORDER — MOUNJARO 10 MG/0.5ML ~~LOC~~ SOAJ: 10.0000 mg | SUBCUTANEOUS | 0 refills | Status: DC

## 2022-08-08 ENCOUNTER — Other Ambulatory Visit (INDEPENDENT_AMBULATORY_CARE_PROVIDER_SITE_OTHER): Payer: Self-pay | Admitting: Family Medicine

## 2022-08-08 ENCOUNTER — Other Ambulatory Visit: Payer: Self-pay | Admitting: Family Medicine

## 2022-08-08 DIAGNOSIS — F3289 Other specified depressive episodes: Secondary | ICD-10-CM

## 2022-08-08 DIAGNOSIS — G8929 Other chronic pain: Secondary | ICD-10-CM

## 2022-08-09 NOTE — Telephone Encounter (Signed)
Requesting: [place] Contract: N/A UDS: N/A Last Visit: 0/17/2023 Next Visit: 08/19/2022 Last Refill: 01/15/2022  Please Advise

## 2022-08-16 IMAGING — CT CT ABD-PELV W/ CM
2 of 5 series · 16 of 46 positions shown, 18 images · IV contrast (OMNIPAQUE)
Comparison: 01/17/2019

CLINICAL DATA: Follow-up uterine and ovarian carcinoma.

EXAM:
CT ABDOMEN AND PELVIS WITH CONTRAST
TECHNIQUE: Multidetector CT imaging of the abdomen and pelvis was performed
using the standard protocol following bolus administration of
intravenous contrast.

[Series 2: axial st · axial · 0.98mm/px · z∈[-468,-103]mm · 13 of 87 slices shown, 15 images]
[im 7/87  soft-tissue]
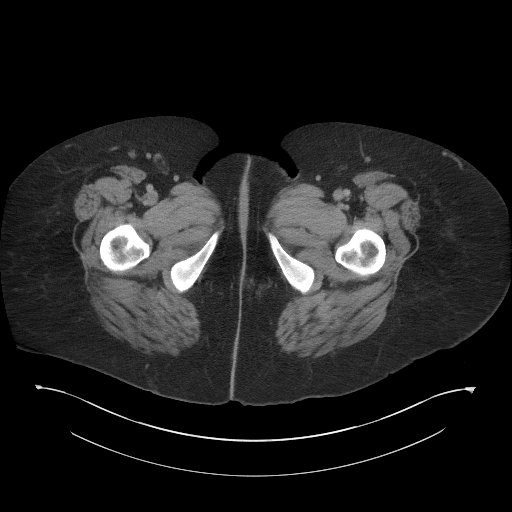
[im 7/87  bone]
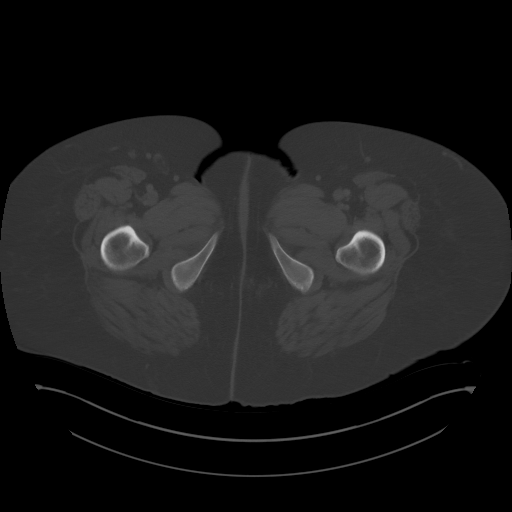
[im 13/87  soft-tissue]
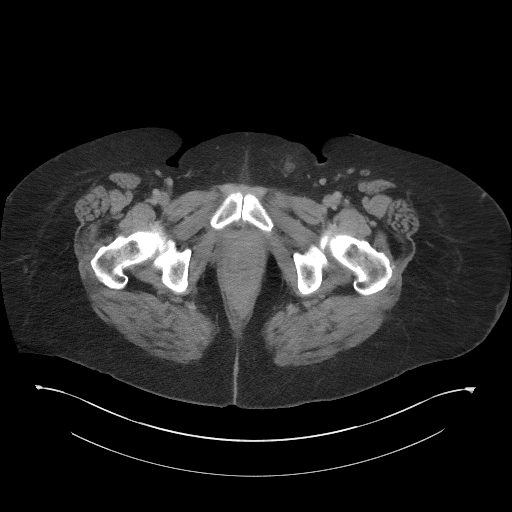
[im 19/87  soft-tissue]
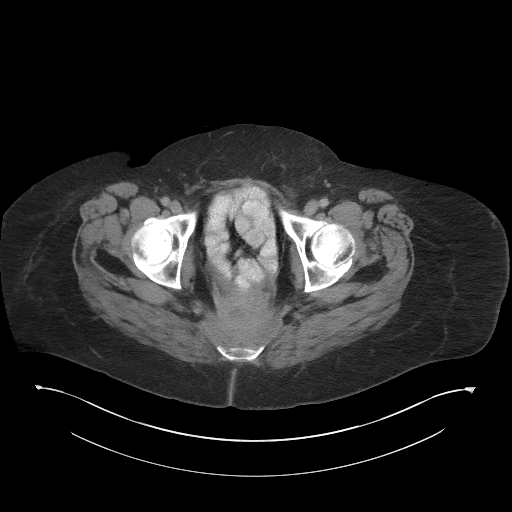
[im 25/87  soft-tissue]
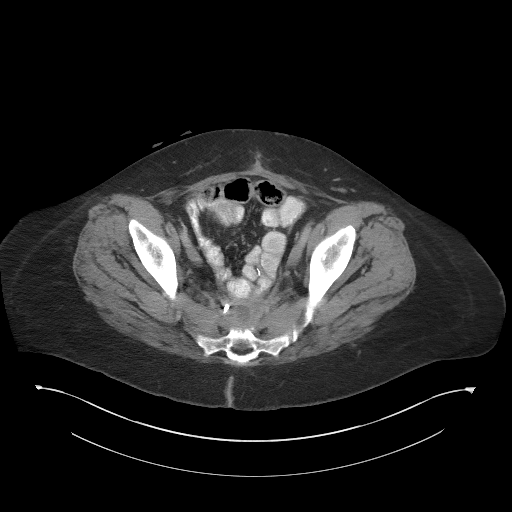
[im 31/87  soft-tissue]
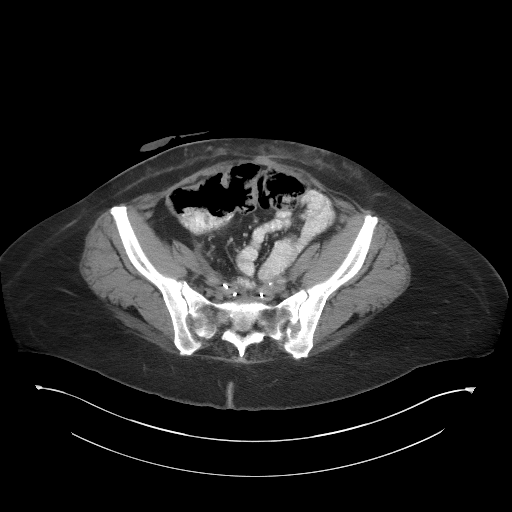
[im 37/87  soft-tissue]
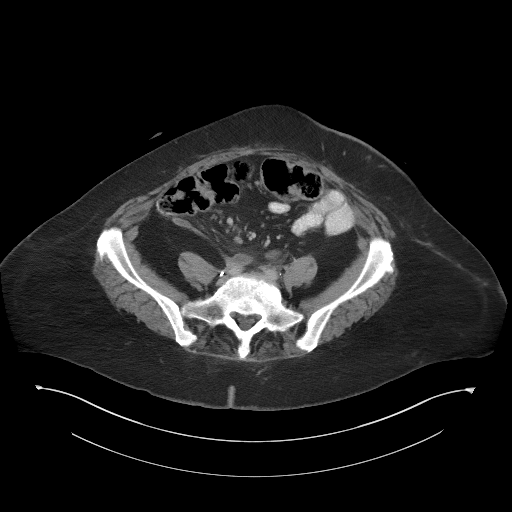
[im 44/87  soft-tissue]
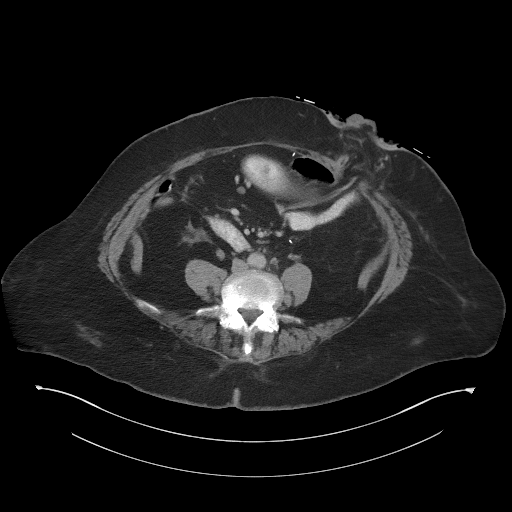
[im 50/87  soft-tissue]
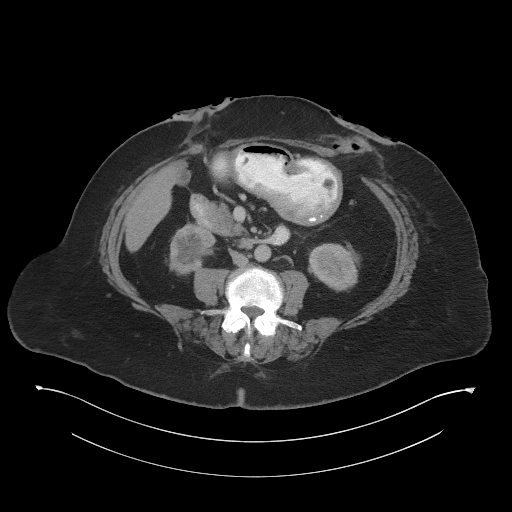
[im 56/87  soft-tissue]
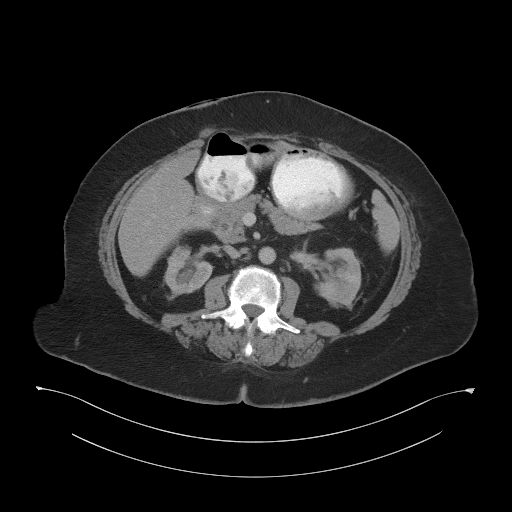
[im 56/87  bone]
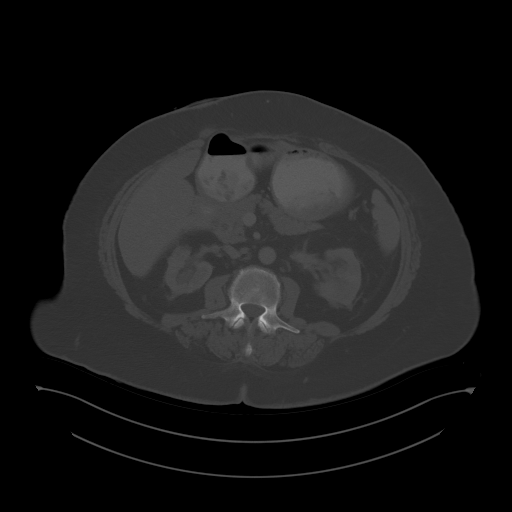
[im 62/87  soft-tissue]
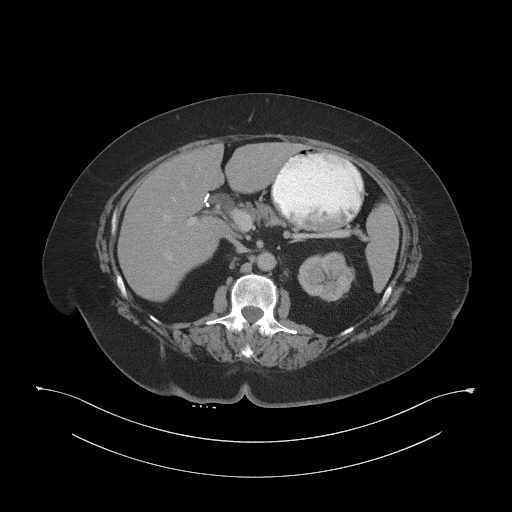
[im 68/87  soft-tissue]
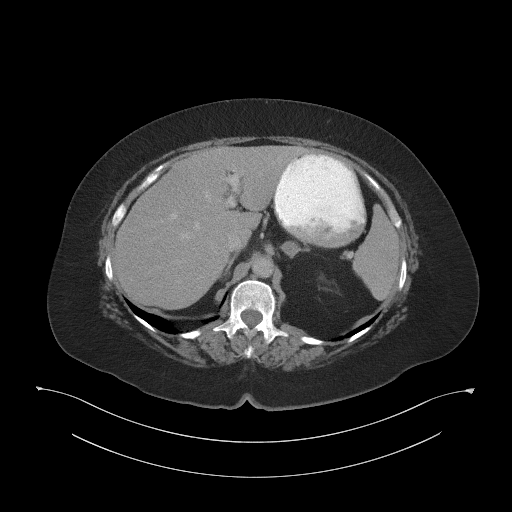
[im 74/87  soft-tissue]
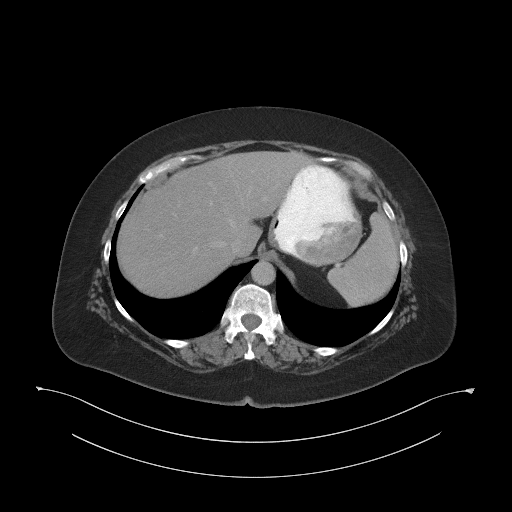
[im 80/87  soft-tissue]
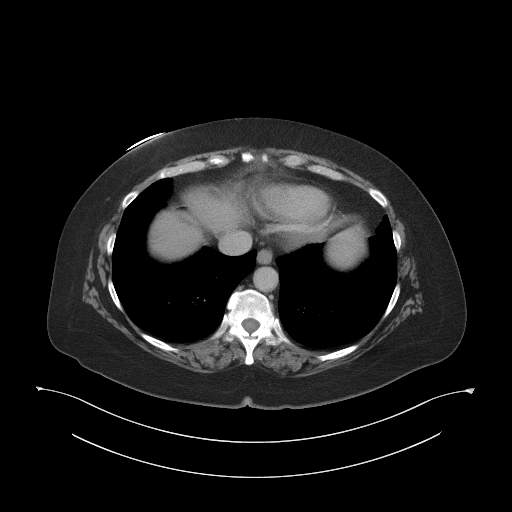

[Series 5: coronal st · coronal · 0.80mm/px · 3 of 104 slices shown]
[im 35/104  soft-tissue]
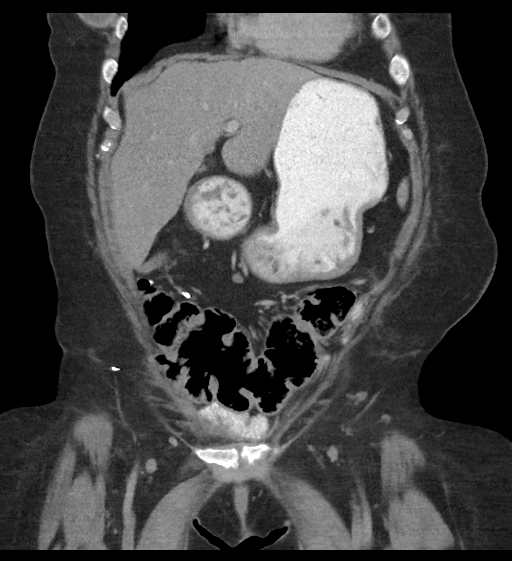
[im 46/104  soft-tissue]
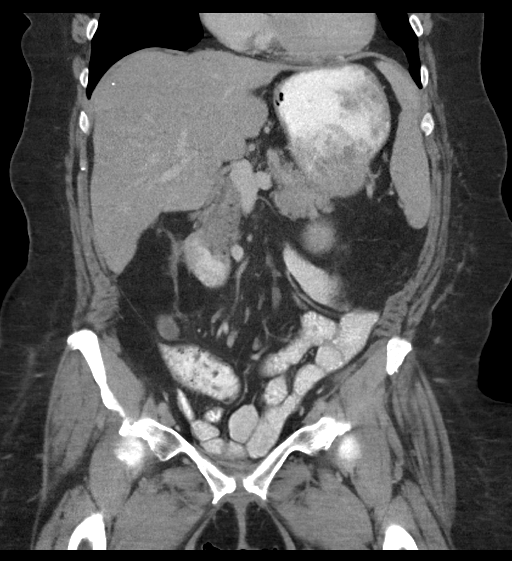
[im 58/104  soft-tissue]
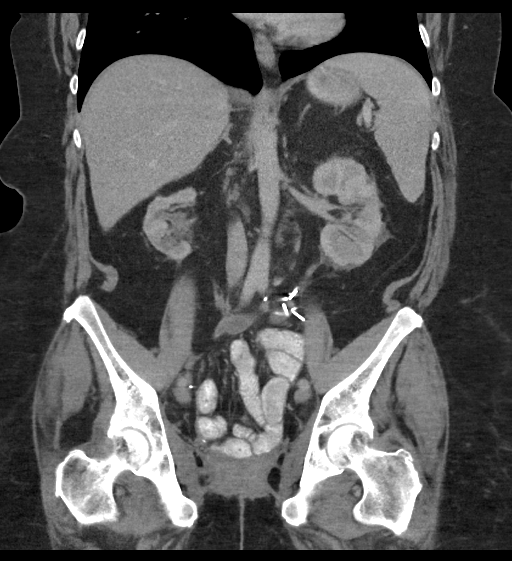

[16 of 46 positions shown; findings below may reference images not displayed]

RADIATION DOSE REDUCTION: This exam was performed according to the
departmental dose-optimization program which includes automated
exposure control, adjustment of the mA and/or kV according to
patient size and/or use of iterative reconstruction technique.

CONTRAST:  75mL OMNIPAQUE IOHEXOL 300 MG/ML  SOLN
FINDINGS: Lower Chest: No acute findings.

Hepatobiliary: No hepatic masses identified. Prior cholecystectomy.
No evidence of biliary obstruction.

Pancreas:  No mass or inflammatory changes.

Spleen: Within normal limits in size and appearance.

Adrenals/Urinary Tract: Stable 1.6 cm homogeneous left adrenal mass,
consistent with benign adenoma. Bilateral percutaneous nephrostomy
tubes have been removed since prior exam. Bilateral renal atrophy
and scarring are again seen, right side greater than left. No renal
masses identified. Prior ileal loop diversion again noted. No
evidence of hydronephrosis.

Stomach/Bowel: Left abdominal colostomy is again seen. Ill-defined
soft tissue density and surgical clips are again seen in the
presacral space, with interval resolution of internal air seen on
previous study. This is consistent with post treatment changes.

Vascular/Lymphatic: New surgical clips are seen in the
retroperitoneum just below the aortic bifurcation, revision of the
urinary diversion. No pathologically enlarged lymph nodes are
identified. No acute vascular findings.

Reproductive:  Prior hysterectomy.

Other:  No evidence of ascites.

Musculoskeletal:  No suspicious bone lesions identified.
IMPRESSION: No acute findings. No evidence of recurrent or metastatic carcinoma
within the abdomen or pelvis.

Stable small benign left adrenal adenoma.

## 2022-08-17 NOTE — Progress Notes (Signed)
Chief Complaint:   OBESITY [name] is here to discuss her progress with her obesity treatment plan along with follow-up of her obesity related diagnoses. [name] is on keeping a food journal and adhering to recommended goals of 1300-1400 calories and 75+ grams of protein and states she is following her eating plan approximately 60% of the time. [name] states she is walking for 30 minutes 2-3 times per week.  Today's visit was #: 96 Starting weight: 208 lbs Starting date: 04/05/2018 Today's weight: 210 lbs Today's date: 08/05/2022 Total lbs lost to date: 0 Total lbs lost since last in-office visit: 4  Interim History: [name]'s last visit was 4-5 months ago. She has struggled with motivation and she is frustrated with how difficult weight loss has been for her especially with her health issues.   Subjective:   1. SOBOE (shortness of breath on exertion) [name] is struggling with weight loss to improve shortness of breath with exercise. She is due for a repeat IC test.   2. Type 2 diabetes mellitus with other specified complication, unspecified whether long term insulin use ([place]) Angles increased Mounjaro to 10 mg last month. She has 2 episodes of nausea, but this isn't uncommon with her GI issues.   Assessment/Plan:   1. SOBOE (shortness of breath on exertion) [name]'s RMR has dropped but not very much. It is still above normal. We discussed the importance of exercising and proper nutrition to improve  2. Type 2 diabetes mellitus with other specified complication, unspecified whether long term insulin use ([place]) [name] will continue Mounjaro, and we will refill for 1 month.   - MOUNJARO 10 MG/0.5ML Pen; Inject 10 mg into the skin every 30 (thirty) days.  Dispense: 2 mL; Refill: 0  3. Obesity, Current BMI 39.7 [name] is currently in the action stage of change. As such, her goal is to continue with weight loss efforts. She has agreed to keeping a food journal and adhering to recommended goals of  1300 calories and 75+ grams of protein daily.   Exercise goals: As is.   Behavioral modification strategies: increasing lean protein intake.  [name] has agreed to follow-up with our clinic in 4 weeks. She was informed of the importance of frequent follow-up visits to maximize her success with intensive lifestyle modifications for her multiple health conditions.   Objective:   Blood pressure 123/66, pulse 71, temperature 97.7 F (36.5 C), height '5\' 1"'$  (1.549 m), weight 210 lb (95.3 kg), SpO2 99 %. Body mass index is 39.68 kg/m.  General: Cooperative, alert, well developed, in no acute distress. HEENT: Conjunctivae and lids unremarkable. Cardiovascular: Regular rhythm.  Lungs: Normal work of breathing. Neurologic: No focal deficits.   Lab Results  Component Value Date   CREATININE 2.20 (H) 06/04/2022   BUN 33 (H) 06/04/2022   NA 134 (L) 06/04/2022   K 4.5 06/04/2022   CL 101 06/04/2022   CO2 27 06/04/2022   Lab Results  Component Value Date   ALT 14 06/04/2022   AST 12 (L) 06/04/2022   ALKPHOS 118 06/04/2022   BILITOT 0.6 06/04/2022   Lab Results  Component Value Date   HGBA1C 6.1 02/15/2022   HGBA1C 6.0 07/23/2021   HGBA1C 6.3 (H) 06/16/2020   HGBA1C 7.9 (H) 09/01/2017   HGBA1C 5.3 04/01/2017   Lab Results  Component Value Date   INSULIN 14.1 06/16/2020   Lab Results  Component Value Date   TSH 2.53 02/15/2022   Lab Results  Component Value Date  CHOL 77 02/15/2022   HDL 44.60 02/15/2022   LDLCALC 16 02/15/2022   TRIG 82.0 02/15/2022   CHOLHDL 2 02/15/2022   Lab Results  Component Value Date   VD25OH 31.09 02/15/2022   VD25OH 25.9 (L) 06/16/2020   VD25OH 40.8 04/05/2018   Lab Results  Component Value Date   WBC 8.2 06/04/2022   HGB 11.3 (L) 06/04/2022   HCT 34.9 (L) 06/04/2022   MCV 93.1 06/04/2022   PLT 184 06/04/2022   Lab Results  Component Value Date   IRON 7 (L) 08/31/2017   TIBC 164 (L) 08/31/2017   FERRITIN 27 11/29/2014    Attestation Statements:   Reviewed by clinician on day of visit: allergies, medications, problem list, medical history, surgical history, family history, social history, and previous encounter notes.   I, Trixie Dredge, am acting as transcriptionist for Dennard Nip, MD.  I have reviewed the above documentation for accuracy and completeness, and I agree with the above. -  Dennard Nip, MD

## 2022-08-19 ENCOUNTER — Encounter: Payer: Self-pay | Admitting: Family Medicine

## 2022-08-19 ENCOUNTER — Ambulatory Visit (INDEPENDENT_AMBULATORY_CARE_PROVIDER_SITE_OTHER): Payer: BC Managed Care – PPO | Admitting: Family Medicine

## 2022-08-19 VITALS — BP 110/70 | HR 70 | Temp 98.3°F | Resp 18 | Ht 61.0 in | Wt 210.2 lb

## 2022-08-19 DIAGNOSIS — Z8543 Personal history of malignant neoplasm of ovary: Secondary | ICD-10-CM

## 2022-08-19 DIAGNOSIS — E039 Hypothyroidism, unspecified: Secondary | ICD-10-CM

## 2022-08-19 DIAGNOSIS — E559 Vitamin D deficiency, unspecified: Secondary | ICD-10-CM

## 2022-08-19 DIAGNOSIS — R300 Dysuria: Secondary | ICD-10-CM | POA: Diagnosis not present

## 2022-08-19 DIAGNOSIS — N183 Chronic kidney disease, stage 3 unspecified: Secondary | ICD-10-CM

## 2022-08-19 DIAGNOSIS — E1169 Type 2 diabetes mellitus with other specified complication: Secondary | ICD-10-CM | POA: Diagnosis not present

## 2022-08-19 DIAGNOSIS — E785 Hyperlipidemia, unspecified: Secondary | ICD-10-CM

## 2022-08-19 DIAGNOSIS — I10 Essential (primary) hypertension: Secondary | ICD-10-CM

## 2022-08-19 DIAGNOSIS — N390 Urinary tract infection, site not specified: Secondary | ICD-10-CM | POA: Diagnosis not present

## 2022-08-19 DIAGNOSIS — I480 Paroxysmal atrial fibrillation: Secondary | ICD-10-CM

## 2022-08-19 DIAGNOSIS — F3289 Other specified depressive episodes: Secondary | ICD-10-CM | POA: Diagnosis not present

## 2022-08-19 DIAGNOSIS — G893 Neoplasm related pain (acute) (chronic): Secondary | ICD-10-CM

## 2022-08-19 DIAGNOSIS — C569 Malignant neoplasm of unspecified ovary: Secondary | ICD-10-CM

## 2022-08-19 LAB — COMPREHENSIVE METABOLIC PANEL
ALT: 26 U/L (ref 0–35)
AST: 18 U/L (ref 0–37)
Albumin: 4 g/dL (ref 3.5–5.2)
Alkaline Phosphatase: 194 U/L — ABNORMAL HIGH (ref 39–117)
BUN: 35 mg/dL — ABNORMAL HIGH (ref 6–23)
CO2: 24 mEq/L (ref 19–32)
Calcium: 8.8 mg/dL (ref 8.4–10.5)
Chloride: 105 mEq/L (ref 96–112)
Creatinine, Ser: 2.42 mg/dL — ABNORMAL HIGH (ref 0.40–1.20)
GFR: 20.11 mL/min — ABNORMAL LOW (ref >60.00)
Glucose, Bld: 145 mg/dL — ABNORMAL HIGH (ref 70–99)
Potassium: 4.3 mEq/L (ref 3.5–5.1)
Sodium: 138 mEq/L (ref 135–145)
Total Bilirubin: 0.3 mg/dL (ref 0.2–1.2)
Total Protein: 7.7 g/dL (ref 6.0–8.3)

## 2022-08-19 LAB — CBC WITH DIFFERENTIAL/PLATELET
Basophils Absolute: 0.1 10*3/uL (ref 0.0–0.1)
Basophils Relative: 0.9 % (ref 0.0–3.0)
Eosinophils Absolute: 0.2 10*3/uL (ref 0.0–0.7)
Eosinophils Relative: 3.1 % (ref 0.0–5.0)
HCT: 37.2 % (ref 36.0–46.0)
Hemoglobin: 12.5 g/dL (ref 12.0–15.0)
Lymphocytes Relative: 17.2 % (ref 12.0–46.0)
Lymphs Abs: 1.1 10*3/uL (ref 0.7–4.0)
MCHC: 33.5 g/dL (ref 30.0–36.0)
MCV: 91.1 fl (ref 78.0–100.0)
Monocytes Absolute: 0.5 10*3/uL (ref 0.1–1.0)
Monocytes Relative: 7 % (ref 3.0–12.0)
Neutro Abs: 4.7 10*3/uL (ref 1.4–7.7)
Neutrophils Relative %: 71.8 % (ref 43.0–77.0)
Platelets: 240 10*3/uL (ref 150.0–400.0)
RBC: 4.08 Mil/uL (ref 3.87–5.11)
RDW: 15.5 % (ref 11.5–15.5)
WBC: 6.5 10*3/uL (ref 4.0–10.5)

## 2022-08-19 LAB — POC URINALSYSI DIPSTICK (AUTOMATED)
Bilirubin, UA: NEGATIVE
Glucose, UA: NEGATIVE
Ketones, UA: NEGATIVE
Nitrite, UA: POSITIVE
Protein, UA: POSITIVE — AB
Spec Grav, UA: 1.01 (ref 1.010–1.025)
Urobilinogen, UA: 0.2 E.U./dL
pH, UA: 6 (ref 5.0–8.0)

## 2022-08-19 LAB — TSH: TSH: 3.6 u[IU]/mL (ref 0.35–5.50)

## 2022-08-19 LAB — LIPID PANEL
Cholesterol: 66 mg/dL (ref 0–200)
HDL: 36.4 mg/dL — ABNORMAL LOW (ref >39.00)
LDL Cholesterol: 10 mg/dL (ref 0–99)
NonHDL: 30.09
Total CHOL/HDL Ratio: 2
Triglycerides: 99 mg/dL (ref 0.0–149.0)
VLDL: 19.8 mg/dL (ref 0.0–40.0)

## 2022-08-19 MED ORDER — CIPROFLOXACIN HCL 500 MG PO TABS: 500.0000 mg | ORAL_TABLET | Freq: Two times a day (BID) | ORAL | 0 refills | Status: AC

## 2022-08-19 NOTE — Patient Instructions (Signed)
Urinary Tract Infection, Adult  A urinary tract infection (UTI) is an infection of any part of the urinary tract. The urinary tract includes the kidneys, ureters, bladder, and urethra. These organs make, store, and get rid of urine in the body. An upper UTI affects the ureters and kidneys. A lower UTI affects the bladder and urethra. What are the causes? Most urinary tract infections are caused by bacteria in your genital area around your urethra, where urine leaves your body. These bacteria grow and cause inflammation of your urinary tract. What increases the risk? You are more likely to develop this condition if: You have a urinary catheter that stays in place. You are not able to control when you urinate or have a bowel movement (incontinence). You are female and you: Use a spermicide or diaphragm for birth control. Have low estrogen levels. Are pregnant. You have certain genes that increase your risk. You are sexually active. You take antibiotic medicines. You have a condition that causes your flow of urine to slow down, such as: An enlarged prostate, if you are female. Blockage in your urethra. A kidney stone. A nerve condition that affects your bladder control (neurogenic bladder). Not getting enough to drink, or not urinating often. You have certain medical conditions, such as: Diabetes. A weak disease-fighting system (immunesystem). Sickle cell disease. Gout. Spinal cord injury. What are the signs or symptoms? Symptoms of this condition include: Needing to urinate right away (urgency). Frequent urination. This may include small amounts of urine each time you urinate. Pain or burning with urination. Blood in the urine. Urine that smells bad or unusual. Trouble urinating. Cloudy urine. Vaginal discharge, if you are female. Pain in the abdomen or the lower back. You may also have: Vomiting or a decreased appetite. Confusion. Irritability or tiredness. A fever or  chills. Diarrhea. The first symptom in older adults may be confusion. In some cases, they may not have any symptoms until the infection has worsened. How is this diagnosed? This condition is diagnosed based on your medical history and a physical exam. You may also have other tests, including: Urine tests. Blood tests. Tests for STIs (sexually transmitted infections). If you have had more than one UTI, a cystoscopy or imaging studies may be done to determine the cause of the infections. How is this treated? Treatment for this condition includes: Antibiotic medicine. Over-the-counter medicines to treat discomfort. Drinking enough water to stay hydrated. If you have frequent infections or have other conditions such as a kidney stone, you may need to see a health care provider who specializes in the urinary tract (urologist). In rare cases, urinary tract infections can cause sepsis. Sepsis is a life-threatening condition that occurs when the body responds to an infection. Sepsis is treated in the hospital with IV antibiotics, fluids, and other medicines. Follow these instructions at home:  Medicines Take over-the-counter and prescription medicines only as told by your health care provider. If you were prescribed an antibiotic medicine, take it as told by your health care provider. Do not stop using the antibiotic even if you start to feel better. General instructions Make sure you: Empty your bladder often and completely. Do not hold urine for long periods of time. Empty your bladder after sex. Wipe from front to back after urinating or having a bowel movement if you are female. Use each tissue only one time when you wipe. Drink enough fluid to keep your urine pale yellow. Keep all follow-up visits. This is important. Contact a health   care provider if: Your symptoms do not get better after 1-2 days. Your symptoms go away and then return. Get help right away if: You have severe pain in  your back or your lower abdomen. You have a fever or chills. You have nausea or vomiting. Summary A urinary tract infection (UTI) is an infection of any part of the urinary tract, which includes the kidneys, ureters, bladder, and urethra. Most urinary tract infections are caused by bacteria in your genital area. Treatment for this condition often includes antibiotic medicines. If you were prescribed an antibiotic medicine, take it as told by your health care provider. Do not stop using the antibiotic even if you start to feel better. Keep all follow-up visits. This is important. This information is not intended to replace advice given to you by your health care provider. Make sure you discuss any questions you have with your health care provider. Document Revised: 02/29/2020 Document Reviewed: 02/29/2020 [employer] Patient Education  2023 [employer]  

## 2022-08-19 NOTE — Assessment & Plan Note (Signed)
Per nephrology 

## 2022-08-19 NOTE — Assessment & Plan Note (Signed)
Stable

## 2022-08-19 NOTE — Assessment & Plan Note (Signed)
Check labs

## 2022-08-19 NOTE — Assessment & Plan Note (Signed)
Encourage heart healthy diet such as MIND or DASH diet, increase exercise, avoid trans fats, simple carbohydrates and processed foods, consider a krill or fish or flaxseed oil cap daily.  °

## 2022-08-19 NOTE — Assessment & Plan Note (Signed)
Well controlled, no changes to meds. Encouraged heart healthy diet such as the DASH diet and exercise as tolerated.  °

## 2022-08-19 NOTE — Progress Notes (Addendum)
Subjective:   By signing my name below, I, Shehryar Baig, attest that this documentation has been prepared under the direction and in the presence of Ann Held, DO. 08/19/2022   Patient ID: [name], female    DOB: 07/13/1956, 67 y.o.   MRN: [id number]  Chief Complaint  Patient presents with   Hyperlipidemia   Pain   Follow-up    Hyperlipidemia Pertinent negatives include no chest pain or shortness of breath.   Patient is in today for a follow up visit.   She reports feeling a hard spot on her left breast that has gotten longer since she first discovered it. She does follow up with her oncologist anymore. She had a ultra sound on the area but the radiologist determined that it might be fat necrosis or calcification.  She complains of lower abdominal pressure, urinary burning, and back pain and thinks she is developing a UTI. She is requesting a referral to an infection disease provider for further evaluation on her frequent UTI.  She has an upcomming appointment with a new cardiologist.  She continues following up with a nephrologist regularly.    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+, HER2 negative/  12-11-2015 bilateral mastecotmy w/ reconstruction (no radiation and no chemo)   Cancer of corpus uteri, except isthmus ([place]) 09/2004   oncologist-- dr rossi/ dr gorsuch:/   dx endometroid endometrial & ovarian cancer s/p  chemotheapy and surgery(TAH w/ BSO) :  recurrent 11-19-2014 post pelvic surgery and radiation 01-29-2015 to 03-10-2015   Chronic idiopathic neutropenia ([facility])    presumed related to chemotherapy March 2006--- followed by dr Alvy Bimler (treatment w/ G-CSF injections   Chronic nausea    CKD (chronic  kidney disease), stage III [facility])    nephrologist-  dr Madelon Lips   Difficult intravenous access    small veins--- hx PICC lines   DM type 2 (diabetes mellitus, type 2) ([place])    endocriologist--  dr Dorothea Ogle [place]   (08-18-2021  per pt checks blood sugar daily in am,  fasting sugar--92--130)   Dysrhythmia    Environmental and seasonal allergies    Generalized muscle weakness    GERD (gastroesophageal reflux disease)    Hiatal hernia    History of abdominal abscess 04/16/2017   post surgery 04-01-2017  --- resolved 10/ 2018   History of COVID-19 05/2021   per pt mild symptoms that resolved   History of gastric polyp    2014  duodenum   History of ileus 04/16/2017   resolved w/ no surgical intervention   History of radiation therapy    01-29-2015 to 03-10-2015  pelvis 50.4Gy   History of small bowel obstruction 01/2019   Hypothyroidism    monitored by dr Legrand Como altheimer   Ileostomy in place [facility]) 04/01/2017   created at same time colostomy takedown./  permnant 09/ 2020   Mixed dyslipidemia    Multiple thyroid nodules    Managed by Dr. Harlow Asa   PAF (paroxysmal atrial fibrillation) [facility])    cardiologist--- dr h. Tamala Julian;  event monitor 05/ 2021 in epic, NSR with 1% burden Afib, first degree heart blockl and PACs;  echo 04/ 2021 ef 55-60%, mild LVH   Peripheral  neuropathy    PONV (postoperative nausea and vomiting)    "scopolamine patch works for me"   Presence of urostomy ([place]) 04/2019   Radiation-induced dermatitis    contact dermatitis , radiation completed, rash only on ankles now.   Vitamin D deficiency    Vulval lesion    Wears glasses     Past Surgical History:  Procedure Laterality Date   APPENDECTOMY     BREAST RECONSTRUCTION WITH PLACEMENT OF TISSUE EXPANDER AND FLEX HD (ACELLULAR HYDRATED DERMIS) Bilateral 12/11/2015   Procedure: BILATERAL BREAST RECONSTRUCTION WITH PLACEMENT OF TISSUE EXPANDERS;  Surgeon: Irene Limbo, MD;  Location: [place];  Service:  Plastics;  Laterality: Bilateral;   COLONOSCOPY WITH PROPOFOL N/A 08/21/2013   Procedure: COLONOSCOPY WITH PROPOFOL;  Surgeon: Cleotis Nipper, MD;  [facility];  Service: Endoscopy;  Laterality: N/A;   COLOSTOMY TAKEDOWN N/A 12/04/2014   Procedure: LAPROSCOPIC LYSIS OF ADHESIONS, SPLENIC MOBILIZATION, RELOCATION OF COLOSTOMY, DEBRIDEMENT INITIAL COLOSTOMY SITE;  Surgeon: Michael [name], MD;  [facility];  Service: General;  Laterality: N/A;   CYSTECTOMY W/ URETEROILEAL CONDUIT  04/12/2019   '@[facility]'$ ;   INTESTINAL ANASTOMOSIS, PANNICULECTOMY, PELVIS EXENTENTION,  PARTIAL COLECTOMY REMOVAL ILEIUM WITH PERMNANT ILEOCOLOSTOMY   CYSTOGRAM N/A 06/01/2017   Procedure: CYSTOGRAM;  Surgeon: Alexis Frock, MD;  [facility];  Service: Urology;  Laterality: N/A;   CYSTOSCOPY W/ RETROGRADES Right 11/21/2015   Procedure: CYSTOSCOPY WITH RETROGRADE PYELOGRAM;  Surgeon: Alexis Frock, MD;  [facility];  Service: Urology;  Laterality: Right;   CYSTOSCOPY W/ URETERAL STENT PLACEMENT Right 11/21/2015   Procedure: CYSTOSCOPY WITH STENT REPLACEMENT;  Surgeon: Alexis Frock, MD;  [facility];  Service: Urology;  Laterality: Right;   CYSTOSCOPY W/ URETERAL STENT PLACEMENT Right 03/10/2016   Procedure: CYSTOSCOPY WITH STENT REPLACEMENT;  Surgeon: Alexis Frock, MD;  Location: [facility];  Service: Urology;  Laterality: Right;   CYSTOSCOPY W/ URETERAL STENT PLACEMENT Right 06/30/2016   Procedure: CYSTOSCOPY WITH RETROGRADE PYELOGRAM/URETERAL STENT EXCHANGE;  Surgeon: Alexis Frock, MD;  Location: [facility];  Service: Urology;  Laterality: Right;   CYSTOSCOPY W/ URETERAL STENT PLACEMENT N/A 06/01/2017   Procedure: CYSTOSCOPY WITH EXAM UNDER ANESTHESIA;  Surgeon: Alexis Frock, MD;  [facility];  Service: Urology;  Laterality: N/A;   CYSTOSCOPY W/ URETERAL STENT PLACEMENT Right 08/17/2017   Procedure: CYSTOSCOPY WITH RETROGRADE PYELOGRAM/URETERAL  STENT REMOVAL;  Surgeon: Alexis Frock, MD;  Location: [facility];  Service: Urology;  Laterality: Right;   CYSTOSCOPY WITH RETROGRADE PYELOGRAM, URETEROSCOPY AND STENT PLACEMENT Right 03/20/2015   Procedure: CYSTOSCOPY WITH RETROGRADE PYELOGRAM, URETEROSCOPY WITH BALLOON DILATION AND STENT PLACEMENT ON RIGHT;  Surgeon: Alexis Frock, MD;  Location: [facility];  Service: Urology;  Laterality: Right;   CYSTOSCOPY WITH RETROGRADE PYELOGRAM, URETEROSCOPY AND STENT PLACEMENT Right 05/02/2015   Procedure: CYSTOSCOPY WITH RIGHT RETROGRADE PYELOGRAM,  DIAGNOSTIC URETEROSCOPY AND STENT PULL ;  Surgeon: Alexis Frock, MD;  Location: [facility];  Service: Urology;  Laterality: Right;   CYSTOSCOPY WITH RETROGRADE PYELOGRAM, URETEROSCOPY AND STENT PLACEMENT Right 09/05/2015   Procedure: CYSTOSCOPY WITH RETROGRADE PYELOGRAM,  AND STENT PLACEMENT;  Surgeon: Alexis Frock, MD;  [facility];  Service: Urology;  Laterality: Right;   CYSTOSCOPY WITH RETROGRADE PYELOGRAM, URETEROSCOPY AND STENT PLACEMENT Right 04/01/2017   Procedure: CYSTOSCOPY WITH RETROGRADE PYELOGRAM, URETEROSCOPY AND STENT PLACEMENT;  Surgeon: Alexis Frock, MD;  [facility];  Service: Urology;  Laterality: Right;   CYSTOSCOPY WITH STENT PLACEMENT Right  10/27/2016   Procedure: CYSTOSCOPY WITH STENT CHANGE and right retrograde pyelogram;  Surgeon: Alexis Frock, MD;  Location: [facility];  Service: Urology;  Laterality: Right;   EUS N/A 10/02/2014   Procedure: LOWER ENDOSCOPIC ULTRASOUND (EUS);  Surgeon: Arta Silence, MD;  Location: [facility];  Service: Endoscopy;  Laterality: N/A;   EXCISION SOFT TISSUE MASS RIGHT FOREMAN  12/08/2006   EYE SURGERY  as child   pytosis of eyelids repair   INCISION AND DRAINAGE OF WOUND Bilateral 12/26/2015   Procedure: DEBRIDEMENT OF BILATERAL MASTECTOMY FLAPS;  Surgeon: Irene Limbo, MD;  Location: [place];   Service: Plastics;  Laterality: Bilateral;   IR CV LINE INJECTION  05/31/2017   IR FLUORO GUIDE CV LINE LEFT  05/31/2017   IR FLUORO GUIDE CV LINE RIGHT  04/06/2017   IR FLUORO GUIDE CV MIDLINE PICC RIGHT  05/30/2017   IR NEPHROSTOGRAM LEFT INITIAL PLACEMENT  09/02/2017   IR NEPHROSTOGRAM LEFT THRU EXISTING ACCESS  11/29/2017   IR NEPHROSTOGRAM RIGHT INITIAL PLACEMENT  09/02/2017   IR NEPHROSTOGRAM RIGHT THRU EXISTING ACCESS  09/13/2017   IR NEPHROSTOGRAM RIGHT THRU EXISTING ACCESS  11/29/2017   IR NEPHROSTOMY EXCHANGE LEFT  11/28/2017   IR NEPHROSTOMY EXCHANGE LEFT  01/05/2018   IR NEPHROSTOMY EXCHANGE LEFT  02/16/2018   IR NEPHROSTOMY EXCHANGE LEFT  03/30/2018   IR NEPHROSTOMY EXCHANGE LEFT  05/12/2018   IR NEPHROSTOMY EXCHANGE LEFT  06/21/2018   IR NEPHROSTOMY EXCHANGE LEFT  08/04/2018   IR NEPHROSTOMY EXCHANGE LEFT  09/18/2018   IR NEPHROSTOMY EXCHANGE LEFT  10/09/2018   IR NEPHROSTOMY EXCHANGE LEFT  10/27/2018   IR NEPHROSTOMY EXCHANGE LEFT  11/21/2018   IR NEPHROSTOMY EXCHANGE LEFT  01/05/2019   IR NEPHROSTOMY EXCHANGE LEFT  02/15/2019   IR NEPHROSTOMY EXCHANGE LEFT  03/29/2019   IR NEPHROSTOMY EXCHANGE RIGHT  10/02/2017   IR NEPHROSTOMY EXCHANGE RIGHT  11/28/2017   IR NEPHROSTOMY EXCHANGE RIGHT  01/05/2018   IR NEPHROSTOMY EXCHANGE RIGHT  02/16/2018   IR NEPHROSTOMY EXCHANGE RIGHT  03/30/2018   IR NEPHROSTOMY EXCHANGE RIGHT  05/12/2018   IR NEPHROSTOMY EXCHANGE RIGHT  06/21/2018   IR NEPHROSTOMY EXCHANGE RIGHT  08/04/2018   IR NEPHROSTOMY EXCHANGE RIGHT  09/18/2018   IR NEPHROSTOMY EXCHANGE RIGHT  10/27/2018   IR NEPHROSTOMY EXCHANGE RIGHT  11/21/2018   IR NEPHROSTOMY EXCHANGE RIGHT  01/05/2019   IR NEPHROSTOMY EXCHANGE RIGHT  02/15/2019   IR NEPHROSTOMY EXCHANGE RIGHT  03/29/2019   IR NEPHROSTOMY PLACEMENT LEFT  10/02/2017   IR RADIOLOGIST EVAL & MGMT  05/03/2017   IR US GUIDE VASC ACCESS LEFT  05/31/2017   IR US GUIDE VASC ACCESS RIGHT  04/06/2017   IR US GUIDE VASC  ACCESS RIGHT  05/30/2017   LAPAROSCOPIC CHOLECYSTECTOMY  1990   LIPOSUCTION WITH LIPOFILLING Bilateral 04/16/2016   Procedure: LIPOSUCTION WITH LIPOFILLING TO BILATERAL CHEST;  Surgeon: Irene Limbo, MD;  Location: [place];  Service: Plastics;  Laterality: Bilateral;   MASTECTOMY W/ SENTINEL NODE BIOPSY Bilateral 12/11/2015   Procedure: RIGHT PROPHYLACTIC MASTECTOMY, LEFT TOTAL MASTECTOMY WITH LEFT AXILLARY SENTINEL LYMPH NODE BIOPSY;  Surgeon: Stark Klein, MD;  Location: [place];  Service: General;  Laterality: Bilateral;   OSTOMY N/A 11/19/2014   Procedure: OSTOMY;  Surgeon: Michael [name], MD;  [facility];  Service: General;  Laterality: N/A;   PROCTOSCOPY N/A 04/01/2017   Procedure: RIDGE PROCTOSCOPY;  Surgeon: Michael [name], MD;  [facility];  Service: General;  Laterality:  N/A;   REMOVAL OF BILATERAL TISSUE EXPANDERS WITH PLACEMENT OF BILATERAL BREAST IMPLANTS Bilateral 04/16/2016   Procedure: REMOVAL OF BILATERAL TISSUE EXPANDERS WITH PLACEMENT OF BILATERAL BREAST IMPLANTS;  Surgeon: Irene Limbo, MD;  Location: [place];  Service: Plastics;  Laterality: Bilateral;   ROBOTIC ASSISTED LAP VAGINAL HYSTERECTOMY N/A 11/19/2014   Procedure: ROBOTIC LYSIS OF ADHESIONS, CONVERTED TO LAPAROTOMY RADICAL UPPER VAGINECTOMY,LOW ANTERIOR BOWEL RESECTION, COLOSTOMY, BILATERAL URETERAL STENT PLACEMENT AND CYSTONOMY CLOSURE;  Surgeon: Everitt Amber, MD;  [facility];  Service: Gynecology;  Laterality: N/A;   TISSUE EXPANDER FILLING Bilateral 12/26/2015   Procedure: EXPANSION OF BILATERAL CHEST TISSUE EXPANDERS (60 mL- Right; 75 mL- Left);  Surgeon: Irene Limbo, MD;  Location: [place];  Service: Plastics;  Laterality: Bilateral;   TONSILLECTOMY     TOTAL ABDOMINAL HYSTERECTOMY  March 2006   [facility]   and Bilateral Salpingoophorectomy/  staging for Ovarian cancer/  an   VULVAR LESION REMOVAL N/A 08/19/2021   Procedure: VULVAR LESION  with biopsies;  Surgeon: Lafonda Mosses, MD;  Location: [facility];  Service: Gynecology;  Laterality: N/A;   XI ROBOTIC ASSISTED LOWER ANTERIOR RESECTION N/A 04/01/2017   Procedure: XI ROBOTIC VS LAPAROSCOPIC COLOSTOMY TAKEDOWN WITH LYSIS OF ADHESIONS.;  Surgeon: Michael [name], MD;  [facility];  Service: General;  Laterality: N/A;  ERAS PATHWAY    Family History  Problem Relation Age of Onset   Cancer Mother 34       stomach ca   Hypertension Mother    Cancer Father 15       prostate ca   Diabetes Father    Heart disease Father        CABG   Hypertension Father    Hyperlipidemia Father    Obesity Father    Breast cancer Maternal Aunt        dx in her 78s   Lymphoma Paternal Aunt    Brain cancer Paternal Grandfather    Ovarian cancer Other    Diabetes Sister    Hypertension Brother y-10   Heart disease Brother        CABG   Diabetes Brother     Social History   Socioeconomic History   Marital status: Married    Spouse name: Armed forces technical officer   Number of children: 1   Years of education: Not on file   Highest education level: Not on file  Occupational History   Occupation: retired Therapist, 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[employer], sports - retired  Tobacco Use   Smoking status: Never   Smokeless tobacco: Never  Vaping Use   Vaping Use: Never used  Substance and Sexual Activity   Alcohol use: Yes    Comment: Occ   Drug use: No   Sexual activity: Not Currently  Other Topics Concern   Not on file  Social History Narrative   Exercise-- has not gotten back into it since cancer came back   Social Determinants of Health   Financial Resource Strain: Not on file  Food Insecurity: Not on file  Transportation Needs: Not on file  Physical Activity: Not on file  Stress: Not on file  Social Connections: Not on file  Intimate Partner Violence: Not on file    Outpatient Medications Prior to Visit  Medication Sig Dispense Refill   aspirin EC 81 MG tablet Take 1 tablet (81  mg total) by mouth daily. Swallow whole. 90 tablet 3   Biotin 5 MG  TABS Take 5 mg by mouth daily.      buPROPion (WELLBUTRIN XL) 150 MG 24 hr tablet Take 150 mg by mouth daily.     calcium citrate-vitamin D (CITRACAL+D) 315-200 MG-UNIT tablet Take 1 tablet by mouth daily.     Cholecalciferol (VITAMIN D3) 10000 UNITS capsule Take 10,000 Units by mouth See admin instructions. Take 1 tablet by mouth 43times per week.     clobetasol (OLUX) 0.05 % topical foam Apply topically 2 (two) times daily. 50 g 3   diphenhydrAMINE (BENADRYL) 25 mg capsule Take 1 capsule (25 mg total) by mouth every 8 (eight) hours as needed for itching, allergies or sleep. 30 capsule 0   famotidine-calcium carbonate-magnesium hydroxide (PEPCID COMPLETE) 10-800-165 MG chewable tablet Chew 1 tablet by mouth as needed.     ferrous sulfate 325 (65 FE) MG tablet Take 1 tablet (325 mg total) by mouth at bedtime. 30 tablet 3   fluticasone (FLONASE) 50 MCG/ACT nasal spray Place 2 sprays into both nostrils daily. 16 g 6   HYDROcodone bit-homatropine (HYCODAN) 5-1.5 MG/5ML syrup Take 5 mLs by mouth every 6 (six) hours as needed for cough. 120 mL 0   levothyroxine (SYNTHROID, LEVOTHROID) 150 MCG tablet Take 1 tablet (150 mcg total) by mouth daily before breakfast. 30 tablet 1   loratadine (CLARITIN) 10 MG tablet Take 10 mg by mouth daily.      MOUNJARO 10 MG/0.5ML Pen Inject 10 mg into the skin every 30 (thirty) days. 2 mL 0   omega-3 acid ethyl esters (LOVAZA) 1 G capsule Take 1 g by mouth 2 (two) times daily.      Polyethyl Glycol-Propyl Glycol (SYSTANE OP) Place 1 drop into both eyes daily as needed (dry eyes).      pregabalin (LYRICA) 50 MG capsule TAKE 1 CAPSULE TWICE A DAY 60 capsule 5   Prenatal Vit-Fe Fumarate-FA (PRENATAL VITAMIN PO) Take 1 capsule by mouth daily. Takes prenatal because there are no dyes in it     Probiotic Product (ALIGN PO) Take 1 capsule by mouth daily.     rosuvastatin (CRESTOR) 10 MG tablet Take 10 mg by  mouth every evening.     Saccharomyces boulardii (FLORASTOR PO) Take 1 capsule by mouth at bedtime.     Tazarotene (ARAZLO) 0.045 % LOTN Apply 1 application topically daily. 45 g 0   Tbo-Filgrastim (GRANIX) 480 MCG/0.8ML SOSY injection 480 mcg subcutaneous injection every 6 days life long 11.2 mL 11   tiZANidine (ZANAFLEX) 4 MG tablet Take 0.5-1 tablets (2-4 mg total) by mouth every 8 (eight) hours as needed for muscle spasms. 180 tablet 3   XIFAXAN 550 MG TABS tablet Take 550 mg by mouth as needed.     No facility-administered medications prior to visit.    Allergies  Allergen Reactions   Penicillins Hives and Swelling    Facial swelling/childhood allergy Has patient had a PCN reaction causing immediate rash, facial/tongue/throat swelling, SOB or lightheadedness with hypotension: Yes Has patient had a PCN reaction causing severe rash involving mucus membranes or skin necrosis: Yes Has patient had a PCN reaction that required hospitalization yes Has patient had a PCN reaction occurring within the last 10 years: No If all of the above answers are "NO", then may proceed with Cephalosporin use.    Erythromycin Other (See Comments)    Gastritis, abd cramps   Tape Rash    blisters   Trimethoprim Rash   Ultram [Tramadol] Hives   Zarxio [Filgrastim] Other (See Comments)  Post injection, elevated heart rate, body feeling unwell, uneasy.    Cephalosporins Rash   Fluconazole Rash   Neomycin Rash    blisters   Oxycodone Other (See Comments)    " I just feel weird"   Pectin Rash    Pectin ring for stoma   Septra [Sulfamethoxazole-Trimethoprim] Rash   Sulfa Antibiotics Rash    Review of Systems  Constitutional:  Negative for fever and malaise/fatigue.  HENT:  Negative for congestion.   Eyes:  Negative for blurred vision.  Respiratory:  Negative for shortness of breath.   Cardiovascular:  Negative for chest pain, palpitations and leg swelling.  Gastrointestinal:  Positive for  abdominal pain (lower abdomnial pressure). Negative for blood in stool and nausea.  Genitourinary:  Positive for dysuria. Negative for frequency.  Musculoskeletal:  Positive for back pain. Negative for falls.  Skin:  Negative for rash.  Neurological:  Negative for dizziness, loss of consciousness and headaches.  Endo/Heme/Allergies:  Negative for environmental allergies.  Psychiatric/Behavioral:  Negative for depression. The patient is not nervous/anxious.        Objective:    Physical Exam Vitals and nursing note reviewed.  Constitutional:      General: She is not in acute distress.    Appearance: Normal appearance. She is well-developed. She is not ill-appearing.  HENT:     Head: Normocephalic and atraumatic.     Right Ear: External ear normal.     Left Ear: External ear normal.  Eyes:     Extraocular Movements: Extraocular movements intact.     Conjunctiva/sclera: Conjunctivae normal.     Pupils: Pupils are equal, round, and reactive to light.  Neck:     Thyroid: No thyromegaly.     Vascular: No carotid bruit or JVD.  Cardiovascular:     Rate and Rhythm: Normal rate and regular rhythm.     Heart sounds: Normal heart sounds. No murmur heard.    No gallop.  Pulmonary:     Effort: Pulmonary effort is normal. No respiratory distress.     Breath sounds: Normal breath sounds. No wheezing or rales.  Chest:     Chest wall: No tenderness.  Musculoskeletal:     Cervical back: Normal range of motion and neck supple.  Skin:    General: Skin is warm and dry.  Neurological:     Mental Status: She is alert and oriented to person, place, and time.  Psychiatric:        Mood and Affect: Mood normal.        Behavior: Behavior normal.        Thought Content: Thought content normal.        Judgment: Judgment normal.     BP 110/70 (BP Location: Left Arm, Patient Position: Sitting, Cuff Size: Large)   Pulse 70   Temp 98.3 F (36.8 C) (Oral)   Resp 18   Ht '5\' 1"'$  (1.549 m)   Wt 210  lb 3.2 oz (95.3 kg)   SpO2 100%   BMI 39.72 kg/m  Wt Readings from Last 3 Encounters:  08/19/22 210 lb 3.2 oz (95.3 kg)  08/05/22 210 lb (95.3 kg)  07/14/22 214 lb 9.6 oz (97.3 kg)       Assessment & Plan:  Dysuria -     POCT Urinalysis Dipstick (Automated) -     Urine Culture  Recurrent UTI (urinary tract infection) -     Ambulatory referral to Infectious Disease -     Ciprofloxacin HCl; Take  1 tablet (500 mg total) by mouth 2 (two) times daily for 10 days.  Dispense: 20 tablet; Refill: 0  Type 2 diabetes mellitus with other specified complication, unspecified whether long term insulin use ([place])  Other depression  Vitamin D deficiency  Hyperlipidemia, unspecified hyperlipidemia type Assessment & Plan: Encourage heart healthy diet such as MIND or DASH diet, increase exercise, avoid trans fats, simple carbohydrates and processed foods, consider a krill or fish or flaxseed oil cap daily.    Orders: -     CBC with Differential/Platelet -     Comprehensive metabolic panel -     Lipid panel -     TSH  Hypothyroidism, unspecified type Assessment & Plan: Check labs    Benign essential HTN Assessment & Plan: Well controlled, no changes to meds. Encouraged heart healthy diet such as the DASH diet and exercise as tolerated.    Orders: -     CBC with Differential/Platelet -     Comprehensive metabolic panel -     Lipid panel -     TSH  Primary malignant neoplasm of ovary, unspecified laterality (HCC)  History of ovarian cancer  Paroxysmal atrial fibrillation (HCC)  Chronic pain due to neoplasm Assessment & Plan: Stable    Stage 3 chronic kidney disease, unspecified whether stage 3a or 3b CKD [facility]) Assessment & Plan: Per nephrology     I, Ann Held, DO, personally preformed the services described in this documentation.  All medical record entries made by the scribe were at my direction and in my presence.  I have reviewed the chart and discharge  instructions (if applicable) and agree that the record reflects my personal performance and is accurate and complete. 08/19/2022   I,Shehryar Baig,acting as a scribe for Ann Held, DO.,have documented all relevant documentation on the behalf of Ann Held, DO,as directed by  Ann Held, DO while in the presence of Ann Held, DO.   Ann Held, DO

## 2022-08-20 ENCOUNTER — Ambulatory Visit: Payer: BC Managed Care – PPO | Admitting: Gynecologic Oncology

## 2022-08-20 DIAGNOSIS — E785 Hyperlipidemia, unspecified: Secondary | ICD-10-CM | POA: Diagnosis not present

## 2022-08-20 DIAGNOSIS — N1832 Chronic kidney disease, stage 3b: Secondary | ICD-10-CM | POA: Diagnosis not present

## 2022-08-20 DIAGNOSIS — E039 Hypothyroidism, unspecified: Secondary | ICD-10-CM | POA: Diagnosis not present

## 2022-08-20 DIAGNOSIS — E1165 Type 2 diabetes mellitus with hyperglycemia: Secondary | ICD-10-CM | POA: Diagnosis not present

## 2022-08-20 DIAGNOSIS — N390 Urinary tract infection, site not specified: Secondary | ICD-10-CM | POA: Diagnosis not present

## 2022-08-20 DIAGNOSIS — E1142 Type 2 diabetes mellitus with diabetic polyneuropathy: Secondary | ICD-10-CM | POA: Diagnosis not present

## 2022-08-20 DIAGNOSIS — E1169 Type 2 diabetes mellitus with other specified complication: Secondary | ICD-10-CM | POA: Diagnosis not present

## 2022-08-20 DIAGNOSIS — I129 Hypertensive chronic kidney disease with stage 1 through stage 4 chronic kidney disease, or unspecified chronic kidney disease: Secondary | ICD-10-CM | POA: Diagnosis not present

## 2022-08-20 DIAGNOSIS — E1122 Type 2 diabetes mellitus with diabetic chronic kidney disease: Secondary | ICD-10-CM | POA: Diagnosis not present

## 2022-08-20 DIAGNOSIS — E559 Vitamin D deficiency, unspecified: Secondary | ICD-10-CM | POA: Diagnosis not present

## 2022-08-20 LAB — URINE CULTURE
MICRO NUMBER:: 14443669
SPECIMEN QUALITY:: ADEQUATE

## 2022-08-21 ENCOUNTER — Other Ambulatory Visit (INDEPENDENT_AMBULATORY_CARE_PROVIDER_SITE_OTHER): Payer: Self-pay | Admitting: Family Medicine

## 2022-08-21 DIAGNOSIS — E1169 Type 2 diabetes mellitus with other specified complication: Secondary | ICD-10-CM

## 2022-08-24 DIAGNOSIS — E1165 Type 2 diabetes mellitus with hyperglycemia: Secondary | ICD-10-CM | POA: Diagnosis not present

## 2022-08-24 DIAGNOSIS — Z7985 Long-term (current) use of injectable non-insulin antidiabetic drugs: Secondary | ICD-10-CM | POA: Diagnosis not present

## 2022-08-24 DIAGNOSIS — E1122 Type 2 diabetes mellitus with diabetic chronic kidney disease: Secondary | ICD-10-CM | POA: Diagnosis not present

## 2022-08-24 DIAGNOSIS — E11649 Type 2 diabetes mellitus with hypoglycemia without coma: Secondary | ICD-10-CM | POA: Diagnosis not present

## 2022-08-24 DIAGNOSIS — I129 Hypertensive chronic kidney disease with stage 1 through stage 4 chronic kidney disease, or unspecified chronic kidney disease: Secondary | ICD-10-CM | POA: Diagnosis not present

## 2022-09-01 DIAGNOSIS — N390 Urinary tract infection, site not specified: Secondary | ICD-10-CM | POA: Diagnosis not present

## 2022-09-01 DIAGNOSIS — E872 Acidosis, unspecified: Secondary | ICD-10-CM | POA: Diagnosis not present

## 2022-09-01 DIAGNOSIS — I4891 Unspecified atrial fibrillation: Secondary | ICD-10-CM | POA: Diagnosis not present

## 2022-09-01 DIAGNOSIS — N1832 Chronic kidney disease, stage 3b: Secondary | ICD-10-CM | POA: Diagnosis not present

## 2022-09-02 ENCOUNTER — Ambulatory Visit (INDEPENDENT_AMBULATORY_CARE_PROVIDER_SITE_OTHER): Payer: BC Managed Care – PPO | Admitting: Family Medicine

## 2022-09-02 ENCOUNTER — Encounter (INDEPENDENT_AMBULATORY_CARE_PROVIDER_SITE_OTHER): Payer: Self-pay | Admitting: Family Medicine

## 2022-09-02 VITALS — BP 125/68 | HR 81 | Temp 97.6°F | Ht 61.0 in | Wt 212.0 lb

## 2022-09-02 DIAGNOSIS — F3289 Other specified depressive episodes: Secondary | ICD-10-CM | POA: Diagnosis not present

## 2022-09-02 DIAGNOSIS — E1169 Type 2 diabetes mellitus with other specified complication: Secondary | ICD-10-CM

## 2022-09-02 DIAGNOSIS — Z6841 Body Mass Index (BMI) 40.0 and over, adult: Secondary | ICD-10-CM | POA: Diagnosis not present

## 2022-09-02 DIAGNOSIS — E669 Obesity, unspecified: Secondary | ICD-10-CM

## 2022-09-02 DIAGNOSIS — Z7985 Long-term (current) use of injectable non-insulin antidiabetic drugs: Secondary | ICD-10-CM

## 2022-09-02 MED ORDER — TIRZEPATIDE 12.5 MG/0.5ML ~~LOC~~ SOAJ: 12.5000 mg | SUBCUTANEOUS | 0 refills | Status: DC

## 2022-09-02 MED ORDER — BUPROPION HCL ER (XL) 150 MG PO TB24: 150.0000 mg | ORAL_TABLET | Freq: Every day | ORAL | 0 refills | Status: DC

## 2022-09-03 ENCOUNTER — Encounter: Payer: Self-pay | Admitting: Infectious Diseases

## 2022-09-03 ENCOUNTER — Ambulatory Visit (INDEPENDENT_AMBULATORY_CARE_PROVIDER_SITE_OTHER): Payer: BC Managed Care – PPO | Admitting: Infectious Diseases

## 2022-09-03 ENCOUNTER — Other Ambulatory Visit: Payer: Self-pay

## 2022-09-03 VITALS — BP 115/73 | HR 62 | Temp 97.9°F | Wt 215.0 lb

## 2022-09-03 DIAGNOSIS — N321 Vesicointestinal fistula: Secondary | ICD-10-CM

## 2022-09-03 DIAGNOSIS — A498 Other bacterial infections of unspecified site: Secondary | ICD-10-CM | POA: Diagnosis not present

## 2022-09-03 DIAGNOSIS — N390 Urinary tract infection, site not specified: Secondary | ICD-10-CM | POA: Diagnosis not present

## 2022-09-03 DIAGNOSIS — Z932 Ileostomy status: Secondary | ICD-10-CM

## 2022-09-03 DIAGNOSIS — Z936 Other artificial openings of urinary tract status: Secondary | ICD-10-CM

## 2022-09-03 NOTE — Progress Notes (Incomplete)
Patient Active Problem List   Diagnosis Date Noted   BMI 40.0-44.9, adult ([place]) 09/02/2022   Obesity, Beginning BMI 38.04 09/02/2022   Dysuria 08/19/2022   Type 2 diabetes mellitus with other specified complication ([place]) 93/81/0175   Type 2 diabetes mellitus with stage 4 chronic kidney disease, without long-term current use of insulin ([place]) 12/10/2021   Vaginal bleeding 12/10/2021   Vulvar lesion 08/18/2021   DM (diabetes mellitus) type II, controlled, with peripheral vascular disorder ([place]) 01/20/2021   Essential (hemorrhagic) thrombocythemia ([place]) 01/20/2021   First degree heart block 01/18/2020   Paroxysmal atrial fibrillation ([place]) 01/18/2020   Swelling of right parotid gland 10/03/2019   Vesicorectal fistula 02/26/2019   Chronic pain    Depression 01/16/2019   Other chronic postprocedural pain 05/25/2018   Malignant neoplasm of uterus ([place]) 05/25/2018   Recurrent UTI (urinary tract infection) 03/08/2018   HLD (hyperlipidemia) 11/29/2017   GERD (gastroesophageal reflux disease) 11/29/2017   Type II diabetes mellitus with renal manifestations ([place]) 11/29/2017   Preventative health care    Poor venous access    Acute renal failure superimposed on chronic kidney disease ([place]) 10/02/2017   Decubitus ulcer of sacral region, stage 2 ([place]) 10/02/2017   MDD (major depressive disorder), single episode, moderate ([place])    Poorly controlled diabetes mellitus ([place]) 08/31/2017   Pressure injury of skin 08/31/2017   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 08/31/2017   History of external beam radiation therapy to pelvis 08/31/2017   ARF (acute renal failure) ([place]) 08/30/2017   Adjustment disorder with mixed anxiety and depressed mood 08/30/2017   Dehydration 08/29/2017   CKD (chronic kidney disease), stage III ([place]) 08/24/2017   Wound of right buttock 08/10/2017   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[place]) 06/20/2017   Postoperative anemia 04/04/2017   Pelvic cancer s/p colostomy takedown/loop  ileostomy diversion 04/01/2017 04/01/2017   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 04/01/2017   Ureteral stricture, right, 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) 04/01/2017 09/10/2016   Vitamin D deficiency 08/30/2016   Genetic testing 11/27/2015   Cancer of corpus uteri, except isthmus ([place]) 11/13/2015   Breast cancer of upper-inner quadrant of left female breast ([place])    Pelvic pain in female 03/19/2015   Chronic anemia 12/30/2014   UTI (urinary tract infection)    Pancytopenia, acquired ([place]) 11/26/2014   Class 2 severe obesity with serious comorbidity and body mass index (BMI) of 38.0 to 38.9 in adult [facility]) 11/20/2014   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 s/p resection/partial vaginectomy/ LAR/colostomy 11/19/2014 11/19/2014   Abdominal pain 09/09/2014   Postmenopausal bleeding 09/05/2014   History of ovarian & endometrial cancer 07/17/2012   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[place]) 05/10/2012   Chronic neutropenia ([place]) 12/14/2011   Hypothyroidism 12/14/2011   DM type 2 (diabetes mellitus, type 2) ([place]) 12/14/2011   Benign essential HTN 12/14/2011    Patient's Medications  New Prescriptions   No medications on file  Previous Medications   ASPIRIN EC 81 MG TABLET    Take 1 tablet (81 mg total) by mouth daily. Swallow whole.   BIOTIN 5 MG TABS    Take 5 mg by mouth daily.    BUPROPION (WELLBUTRIN XL) 150 MG 24 HR TABLET    Take 1 tablet (150 mg total) by mouth daily.   CALCIUM CITRATE-VITAMIN D (CITRACAL+D) 315-200 MG-UNIT TABLET    Take 1 tablet by mouth daily.   CHOLECALCIFEROL (VITAMIN D3) 10000 UNITS CAPSULE    Take 10,000  Units by mouth See admin instructions. Take 1 tablet by mouth 43times per week.   CLOBETASOL (OLUX) 0.05 % TOPICAL FOAM    Apply topically 2 (two) times daily.   DIPHENHYDRAMINE (BENADRYL) 25 MG CAPSULE    Take 1 capsule (25 mg total) by mouth every 8 (eight) hours as needed for itching, allergies or sleep.    FAMOTIDINE-CALCIUM CARBONATE-MAGNESIUM HYDROXIDE (PEPCID COMPLETE) 10-800-165 MG CHEWABLE TABLET    Chew 1 tablet by mouth as needed.   FERROUS SULFATE 325 (65 FE) MG TABLET    Take 1 tablet (325 mg total) by mouth at bedtime.   FLUTICASONE (FLONASE) 50 MCG/ACT NASAL SPRAY    Place 2 sprays into both nostrils daily.   HYDROCODONE BIT-HOMATROPINE (HYCODAN) 5-1.5 MG/5ML SYRUP    Take 5 mLs by mouth every 6 (six) hours as needed for cough.   LEVOTHYROXINE (SYNTHROID, LEVOTHROID) 150 MCG TABLET    Take 1 tablet (150 mcg total) by mouth daily before breakfast.   LORATADINE (CLARITIN) 10 MG TABLET    Take 10 mg by mouth daily.    OMEGA-3 ACID ETHYL ESTERS (LOVAZA) 1 G CAPSULE    Take 1 g by mouth 2 (two) times daily.    POLYETHYL GLYCOL-PROPYL GLYCOL (SYSTANE OP)    Place 1 drop into both eyes daily as needed (dry eyes).    PREGABALIN (LYRICA) 50 MG CAPSULE    TAKE 1 CAPSULE TWICE A DAY   PRENATAL VIT-FE FUMARATE-FA (PRENATAL VITAMIN PO)    Take 1 capsule by mouth daily. Takes prenatal because there are no dyes in it   PROBIOTIC PRODUCT (ALIGN PO)    Take 1 capsule by mouth daily.   ROSUVASTATIN (CRESTOR) 10 MG TABLET    Take 10 mg by mouth every evening.   SACCHAROMYCES BOULARDII (FLORASTOR PO)    Take 1 capsule by mouth at bedtime.   TAZAROTENE (ARAZLO) 0.045 % LOTN    Apply 1 application topically daily.   TBO-FILGRASTIM (GRANIX) 480 MCG/0.8ML SOSY INJECTION    480 mcg subcutaneous injection every 6 days life long   TIRZEPATIDE (MOUNJARO) 12.5 MG/0.5ML PEN    Inject 12.5 mg into the skin once a week.   TIZANIDINE (ZANAFLEX) 4 MG TABLET    Take 0.5-1 tablets (2-4 mg total) by mouth every 8 (eight) hours as needed for muscle spasms.   XIFAXAN 550 MG TABS TABLET    Take 550 mg by mouth as needed.  Modified Medications   No medications on file  Discontinued Medications   No medications on file    Subjective: 67 Y O female with PMH recurrent endometrioid endometrial/ovarian cancer.   S/p exploratory  laparotomy, posterior supralevator pelvic exenteration, end colostomy, bladder repair, ureteral stenting with complete resection and negative margins on 11/09/14. S/p adjuvant radiation completed 03/10/15. Disease free since this time.   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   Status post pelvic exenteration procedure at [facility] in September 2020 with proctectomy and cystectomy and formation of permanent colostomy and ileal conduit.  Vaginectomy not performed.  1/18 - lower abdominal pressure, urinary burning, and back pain and thinks she is developing a UTI. Ciprofloxacin for 10 days  1/19- 50,000-100,000 colonies MRSE   Feels like she gets UTI every quarter in a year. Describes UTI symptoms as urethral burning, lower abdominal pressure like sensation, hurting when she bears down, poor smell  as well as whitish flakes in her urinary. Denies fevers , may be chills. Feels her back pain gets worse although she has h/o back pain due to OA. She collects her urine from the urobag. She has also been struggling with vaginal as well as rectal discharge ( sero-sanguinous to sanguinous with sometimes passing blood clots) due to her h/o fistula. Reports ' takes 2 round of abtx to knock of all her UTI symptoms". 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+, HER2 negative/   12-11-2015 bilateral mastecotmy w/ reconstruction (no radiation and no chemo)   Cancer of corpus uteri, except isthmus ([place]) 09/2004   oncologist-- dr rossi/ dr gorsuch:/   dx endometroid endometrial & ovarian cancer s/p  chemotheapy and surgery(TAH w/ BSO) :  recurrent 11-19-2014 post pelvic surgery and radiation 01-29-2015 to 03-10-2015   Chronic idiopathic neutropenia ([place])    presumed related to chemotherapy March 2006--- followed by dr Alvy Bimler (treatment w/ G-CSF injections   Chronic nausea    CKD (chronic kidney disease), stage III [facility])    nephrologist-  dr Madelon Lips   Difficult intravenous access    small veins--- hx PICC lines   DM type 2 (diabetes mellitus, type 2) ([place])    endocriologist--  dr Dorothea Ogle [place]   (08-18-2021  per pt checks blood sugar daily in am,  fasting sugar--92--130)   Dysrhythmia    Environmental and seasonal allergies    Generalized muscle weakness    GERD (gastroesophageal reflux disease)    Hiatal hernia    History of abdominal abscess 04/16/2017   post surgery 04-01-2017  --- resolved 10/ 2018   History of COVID-19 05/2021   per pt mild symptoms that resolved   History of gastric polyp    2014  duodenum   History of ileus 04/16/2017   resolved w/ no surgical intervention   History of radiation therapy    01-29-2015 to 03-10-2015  pelvis 50.4Gy   History of small bowel obstruction 01/2019   Hypothyroidism    monitored by dr Legrand Como altheimer   Ileostomy in place [facility]) 04/01/2017   created at same time colostomy takedown./  permnant 09/ 2020   Mixed dyslipidemia    Multiple thyroid nodules    Managed by Dr. Harlow Asa   PAF (paroxysmal atrial fibrillation) [facility])    cardiologist--- dr h. Tamala Julian;  event monitor 05/ 2021 in epic, NSR with 1% burden Afib, first degree heart blockl and PACs;  echo 04/ 2021 ef 55-60%, mild LVH   Peripheral neuropathy    PONV (postoperative nausea and vomiting)    "scopolamine patch works for me"   Presence of  urostomy ([place]) 04/2019   Radiation-induced dermatitis    contact dermatitis , radiation completed, rash only on ankles now.   Vitamin D deficiency    Vulval lesion    Wears glasses    Past Surgical History:  Procedure Laterality Date   APPENDECTOMY     BREAST RECONSTRUCTION WITH PLACEMENT OF TISSUE EXPANDER AND FLEX HD (ACELLULAR HYDRATED DERMIS) Bilateral 12/11/2015  Procedure: BILATERAL BREAST RECONSTRUCTION WITH PLACEMENT OF TISSUE EXPANDERS;  Surgeon: Irene Limbo, MD;  Location: [place];  Service: Plastics;  Laterality: Bilateral;   COLONOSCOPY WITH PROPOFOL N/A 08/21/2013   Procedure: COLONOSCOPY WITH PROPOFOL;  Surgeon: Cleotis Nipper, MD;  [facility];  Service: Endoscopy;  Laterality: N/A;   COLOSTOMY TAKEDOWN N/A 12/04/2014   Procedure: LAPROSCOPIC LYSIS OF ADHESIONS, SPLENIC MOBILIZATION, RELOCATION OF COLOSTOMY, DEBRIDEMENT INITIAL COLOSTOMY SITE;  Surgeon: Michael [name], MD;  [facility];  Service: General;  Laterality: N/A;   CYSTECTOMY W/ URETEROILEAL CONDUIT  04/12/2019   '@[facility]'$ ;   INTESTINAL ANASTOMOSIS, PANNICULECTOMY, PELVIS EXENTENTION,  PARTIAL COLECTOMY REMOVAL ILEIUM WITH PERMNANT ILEOCOLOSTOMY   CYSTOGRAM N/A 06/01/2017   Procedure: CYSTOGRAM;  Surgeon: Alexis Frock, MD;  [facility];  Service: Urology;  Laterality: N/A;   CYSTOSCOPY W/ RETROGRADES Right 11/21/2015   Procedure: CYSTOSCOPY WITH RETROGRADE PYELOGRAM;  Surgeon: Alexis Frock, MD;  [facility];  Service: Urology;  Laterality: Right;   CYSTOSCOPY W/ URETERAL STENT PLACEMENT Right 11/21/2015   Procedure: CYSTOSCOPY WITH STENT REPLACEMENT;  Surgeon: Alexis Frock, MD;  [facility];  Service: Urology;  Laterality: Right;   CYSTOSCOPY W/ URETERAL STENT PLACEMENT Right 03/10/2016   Procedure: CYSTOSCOPY WITH STENT REPLACEMENT;  Surgeon: Alexis Frock, MD;  Location: [facility];  Service: Urology;  Laterality: Right;   CYSTOSCOPY W/ URETERAL STENT  PLACEMENT Right 06/30/2016   Procedure: CYSTOSCOPY WITH RETROGRADE PYELOGRAM/URETERAL STENT EXCHANGE;  Surgeon: Alexis Frock, MD;  Location[facility];  Service: Urology;  Laterality: Right;   CYSTOSCOPY W/ URETERAL STENT PLACEMENT N/A 06/01/2017   Procedure: CYSTOSCOPY WITH EXAM UNDER ANESTHESIA;  Surgeon: Alexis Frock, MD;  [facility];  Service: Urology;  Laterality: N/A;   CYSTOSCOPY W/ URETERAL STENT PLACEMENT Right 08/17/2017   Procedure: CYSTOSCOPY WITH RETROGRADE PYELOGRAM/URETERAL STENT REMOVAL;  Surgeon: Alexis Frock, MD;  Location: [facility];  Service: Urology;  Laterality: Right;   CYSTOSCOPY WITH RETROGRADE PYELOGRAM, URETEROSCOPY AND STENT PLACEMENT Right 03/20/2015   Procedure: CYSTOSCOPY WITH RETROGRADE PYELOGRAM, URETEROSCOPY WITH BALLOON DILATION AND STENT PLACEMENT ON RIGHT;  Surgeon: Alexis Frock, MD;  Location: [facility];  Service: Urology;  Laterality: Right;   CYSTOSCOPY WITH RETROGRADE PYELOGRAM, URETEROSCOPY AND STENT PLACEMENT Right 05/02/2015   Procedure: CYSTOSCOPY WITH RIGHT RETROGRADE PYELOGRAM,  DIAGNOSTIC URETEROSCOPY AND STENT PULL ;  Surgeon: Alexis Frock, MD;  Location: [facility];  Service: Urology;  Laterality: Right;   CYSTOSCOPY WITH RETROGRADE PYELOGRAM, URETEROSCOPY AND STENT PLACEMENT Right 09/05/2015   Procedure: CYSTOSCOPY WITH RETROGRADE PYELOGRAM,  AND STENT PLACEMENT;  Surgeon: Alexis Frock, MD;  [facility];  Service: Urology;  Laterality: Right;   CYSTOSCOPY WITH RETROGRADE PYELOGRAM, URETEROSCOPY AND STENT PLACEMENT Right 04/01/2017   Procedure: CYSTOSCOPY WITH RETROGRADE PYELOGRAM, URETEROSCOPY AND STENT PLACEMENT;  Surgeon: Alexis Frock, MD;  [facility];  Service: Urology;  Laterality: Right;   CYSTOSCOPY WITH STENT PLACEMENT Right 10/27/2016   Procedure: CYSTOSCOPY WITH STENT CHANGE and right retrograde pyelogram;  Surgeon: Alexis Frock, MD;   Location: [facility];  Service: Urology;  Laterality: Right;   EUS N/A 10/02/2014   Procedure: LOWER ENDOSCOPIC ULTRASOUND (EUS);  Surgeon: Arta Silence, MD;  Location: [facility];  Service: Endoscopy;  Laterality: N/A;   EXCISION SOFT TISSUE MASS RIGHT FOREMAN  12/08/2006   EYE SURGERY  as child   pytosis of eyelids repair   INCISION AND DRAINAGE OF WOUND Bilateral 12/26/2015   Procedure:  DEBRIDEMENT OF BILATERAL MASTECTOMY FLAPS;  Surgeon: Irene Limbo, MD;  Location: [place];  Service: Plastics;  Laterality: Bilateral;   IR CV LINE INJECTION  05/31/2017   IR FLUORO GUIDE CV LINE LEFT  05/31/2017   IR FLUORO GUIDE CV LINE RIGHT  04/06/2017   IR FLUORO GUIDE CV MIDLINE PICC RIGHT  05/30/2017   IR NEPHROSTOGRAM LEFT INITIAL PLACEMENT  09/02/2017   IR NEPHROSTOGRAM LEFT THRU EXISTING ACCESS  11/29/2017   IR NEPHROSTOGRAM RIGHT INITIAL PLACEMENT  09/02/2017   IR NEPHROSTOGRAM RIGHT THRU EXISTING ACCESS  09/13/2017   IR NEPHROSTOGRAM RIGHT THRU EXISTING ACCESS  11/29/2017   IR NEPHROSTOMY EXCHANGE LEFT  11/28/2017   IR NEPHROSTOMY EXCHANGE LEFT  01/05/2018   IR NEPHROSTOMY EXCHANGE LEFT  02/16/2018   IR NEPHROSTOMY EXCHANGE LEFT  03/30/2018   IR NEPHROSTOMY EXCHANGE LEFT  05/12/2018   IR NEPHROSTOMY EXCHANGE LEFT  06/21/2018   IR NEPHROSTOMY EXCHANGE LEFT  08/04/2018   IR NEPHROSTOMY EXCHANGE LEFT  09/18/2018   IR NEPHROSTOMY EXCHANGE LEFT  10/09/2018   IR NEPHROSTOMY EXCHANGE LEFT  10/27/2018   IR NEPHROSTOMY EXCHANGE LEFT  11/21/2018   IR NEPHROSTOMY EXCHANGE LEFT  01/05/2019   IR NEPHROSTOMY EXCHANGE LEFT  02/15/2019   IR NEPHROSTOMY EXCHANGE LEFT  03/29/2019   IR NEPHROSTOMY EXCHANGE RIGHT  10/02/2017   IR NEPHROSTOMY EXCHANGE RIGHT  11/28/2017   IR NEPHROSTOMY EXCHANGE RIGHT  01/05/2018   IR NEPHROSTOMY EXCHANGE RIGHT  02/16/2018   IR NEPHROSTOMY EXCHANGE RIGHT  03/30/2018   IR NEPHROSTOMY EXCHANGE RIGHT  05/12/2018   IR NEPHROSTOMY  EXCHANGE RIGHT  06/21/2018   IR NEPHROSTOMY EXCHANGE RIGHT  08/04/2018   IR NEPHROSTOMY EXCHANGE RIGHT  09/18/2018   IR NEPHROSTOMY EXCHANGE RIGHT  10/27/2018   IR NEPHROSTOMY EXCHANGE RIGHT  11/21/2018   IR NEPHROSTOMY EXCHANGE RIGHT  01/05/2019   IR NEPHROSTOMY EXCHANGE RIGHT  02/15/2019   IR NEPHROSTOMY EXCHANGE RIGHT  03/29/2019   IR NEPHROSTOMY PLACEMENT LEFT  10/02/2017   IR RADIOLOGIST EVAL & MGMT  05/03/2017   IR US GUIDE VASC ACCESS LEFT  05/31/2017   IR US GUIDE VASC ACCESS RIGHT  04/06/2017   IR US GUIDE VASC ACCESS RIGHT  05/30/2017   LAPAROSCOPIC CHOLECYSTECTOMY  1990   LIPOSUCTION WITH LIPOFILLING Bilateral 04/16/2016   Procedure: LIPOSUCTION WITH LIPOFILLING TO BILATERAL CHEST;  Surgeon: Irene Limbo, MD;  Location: [place];  Service: Plastics;  Laterality: Bilateral;   MASTECTOMY W/ SENTINEL NODE BIOPSY Bilateral 12/11/2015   Procedure: RIGHT PROPHYLACTIC MASTECTOMY, LEFT TOTAL MASTECTOMY WITH LEFT AXILLARY SENTINEL LYMPH NODE BIOPSY;  Surgeon: Stark Klein, MD;  Location: [place];  Service: General;  Laterality: Bilateral;   OSTOMY N/A 11/19/2014   Procedure: OSTOMY;  Surgeon: Michael [name], MD;  [facility];  Service: General;  Laterality: N/A;   PROCTOSCOPY N/A 04/01/2017   Procedure: RIDGE PROCTOSCOPY;  Surgeon: Michael [name], MD;  [facility];  Service: General;  Laterality: N/A;   REMOVAL OF BILATERAL TISSUE EXPANDERS WITH PLACEMENT OF BILATERAL BREAST IMPLANTS Bilateral 04/16/2016   Procedure: REMOVAL OF BILATERAL TISSUE EXPANDERS WITH PLACEMENT OF BILATERAL BREAST IMPLANTS;  Surgeon: Irene Limbo, MD;  Location: [place];  Service: Plastics;  Laterality: Bilateral;   ROBOTIC ASSISTED LAP VAGINAL HYSTERECTOMY N/A 11/19/2014   Procedure: ROBOTIC LYSIS OF ADHESIONS, CONVERTED TO LAPAROTOMY RADICAL UPPER VAGINECTOMY,LOW ANTERIOR BOWEL RESECTION, COLOSTOMY, BILATERAL URETERAL STENT PLACEMENT AND CYSTONOMY CLOSURE;  Surgeon:  Everitt Amber, MD;  [facility];  Service:  Gynecology;  Laterality: N/A;   TISSUE EXPANDER FILLING Bilateral 12/26/2015   Procedure: EXPANSION OF BILATERAL CHEST TISSUE EXPANDERS (60 mL- Right; 75 mL- Left);  Surgeon: Irene Limbo, MD;  Location: [place];  Service: Plastics;  Laterality: Bilateral;   TONSILLECTOMY     TOTAL ABDOMINAL HYSTERECTOMY  March 2006   [facility]   and Bilateral Salpingoophorectomy/  staging for Ovarian cancer/  an   VULVAR LESION REMOVAL N/A 08/19/2021   Procedure: VULVAR LESION with biopsies;  Surgeon: Lafonda Mosses, MD;  Location: [facility];  Service: Gynecology;  Laterality: N/A;   XI ROBOTIC ASSISTED LOWER ANTERIOR RESECTION N/A 04/01/2017   Procedure: XI ROBOTIC VS LAPAROSCOPIC COLOSTOMY TAKEDOWN WITH LYSIS OF ADHESIONS.;  Surgeon: Michael [name], MD;  [facility];  Service: General;  Laterality: N/A;  ERAS PATHWAY     Social History   Tobacco Use   Smoking status: Never   Smokeless tobacco: Never  Vaping Use   Vaping Use: Never used  Substance Use Topics   Alcohol use: Yes    Comment: Occ   Drug use: No    Family History  Problem Relation Age of Onset   Cancer Mother 2       stomach ca   Hypertension Mother    Cancer Father 24       prostate ca   Diabetes Father    Heart disease Father        CABG   Hypertension Father    Hyperlipidemia Father    Obesity Father    Breast cancer Maternal Aunt        dx in her 29s   Lymphoma Paternal 75    Brain cancer Paternal Grandfather    Ovarian cancer Other    Diabetes Sister    Hypertension Brother y-10   Heart disease Brother        CABG   Diabetes Brother     Allergies  Allergen Reactions   Penicillins Hives and Swelling    Facial swelling/childhood allergy Has patient had a PCN reaction causing immediate rash, facial/tongue/throat swelling, SOB or lightheadedness with hypotension: Yes Has patient had a PCN reaction causing severe rash  involving mucus membranes or skin necrosis: Yes Has patient had a PCN reaction that required hospitalization yes Has patient had a PCN reaction occurring within the last 10 years: No If all of the above answers are "NO", then may proceed with Cephalosporin use.    Erythromycin Other (See Comments)    Gastritis, abd cramps   Tape Rash    blisters   Trimethoprim Rash   Ultram [Tramadol] Hives   Zarxio [Filgrastim] Other (See Comments)    Post injection, elevated heart rate, body feeling unwell, uneasy.    Cephalosporins Rash   Fluconazole Rash   Neomycin Rash    blisters   Oxycodone Other (See Comments)    " I just feel weird"   Pectin Rash    Pectin ring for stoma   Septra [Sulfamethoxazole-Trimethoprim] Rash   Sulfa Antibiotics Rash    Health Maintenance  Topic Date Due   COVID-19 Vaccine (7 - 2023-24 season) 04/02/2022   HEMOGLOBIN A1C  08/18/2022   Diabetic kidney evaluation - Urine ACR  02/16/2023   FOOT EXAM  02/16/2023   OPHTHALMOLOGY EXAM  04/01/2023   Diabetic kidney evaluation - eGFR measurement  08/20/2023   DTaP/Tdap/Td (4 - Td or Tdap) 08/27/2025   COLONOSCOPY (Pts 45-37yr Insurance coverage will need to be confirmed)  11/20/2030   Pneumonia Vaccine 29+ Years old  Completed   INFLUENZA VACCINE  Completed   DEXA SCAN  Completed   Hepatitis C Screening  Completed   Zoster Vaccines- Shingrix  Completed   HPV VACCINES  Aged Out    Objective:  There were no vitals filed for this visit. There is no height or weight on file to calculate BMI.  Physical Exam Constitutional:      Appearance: Normal appearance.  HENT:     Head: Normocephalic and atraumatic.      Mouth: Mucous membranes are moist.  Eyes:    Conjunctiva/sclera: Conjunctivae normal.     Pupils: Pupils are equal, round, and reactive to light.   Cardiovascular:     Rate and Rhythm: Normal rate and regular rhythm.     Heart sounds: No murmur heard. No friction rub. No gallop.   Pulmonary:      Effort: Pulmonary effort is normal.     Breath sounds: Normal breath sounds.   Abdominal:     General: Non distended     Palpations: soft.   Musculoskeletal:        General: Normal range of motion.   Skin:    General: Skin is warm and dry.     Comments:  Neurological:     General: grossly non focal     Mental Status: awake, alert and oriented to person, place, and time.   Psychiatric:        Mood and Affect: Mood normal.   Lab Results Lab Results  Component Value Date   WBC 6.5 08/19/2022   HGB 12.5 08/19/2022   HCT 37.2 08/19/2022   MCV 91.1 08/19/2022   PLT 240.0 08/19/2022    Lab Results  Component Value Date   CREATININE 2.42 (H) 08/19/2022   BUN 35 (H) 08/19/2022   NA 138 08/19/2022   K 4.3 08/19/2022   CL 105 08/19/2022   CO2 24 08/19/2022    Lab Results  Component Value Date   ALT 26 08/19/2022   AST 18 08/19/2022   ALKPHOS 194 (H) 08/19/2022   BILITOT 0.3 08/19/2022    Lab Results  Component Value Date   CHOL 66 08/19/2022   HDL 36.40 (L) 08/19/2022   LDLCALC 10 08/19/2022   TRIG 99.0 08/19/2022   CHOLHDL 2 08/19/2022   No results found for: "LABRPR", "RPRTITER" No results found for: "HIV1RNAQUANT", "HIV1RNAVL", "CD4TABS"  Imaging1/24/23 CT abdomen/pelvis  FINDINGS: Lower Chest: No acute findings.   Hepatobiliary: No hepatic masses identified. Prior cholecystectomy. No evidence of biliary obstruction.   Pancreas:  No mass or inflammatory changes.   Spleen: Within normal limits in size and appearance.   Adrenals/Urinary Tract: Stable 1.6 cm homogeneous left adrenal mass, consistent with benign adenoma. Bilateral percutaneous nephrostomy tubes have been removed since prior exam. Bilateral renal atrophy and scarring are again seen, right side greater than left. No renal masses identified. Prior ileal loop diversion again noted. No evidence of hydronephrosis.   Stomach/Bowel: Left abdominal colostomy is again seen. Ill-defined soft  tissue density and surgical clips are again seen in the presacral space, with interval resolution of internal air seen on previous study. This is consistent with post treatment changes.   Vascular/Lymphatic: New surgical clips are seen in the retroperitoneum just below the aortic bifurcation, revision of the urinary diversion. No pathologically enlarged lymph nodes are identified. No acute vascular findings.   Reproductive:  Prior hysterectomy.   Other:  No evidence of ascites.  Musculoskeletal:  No suspicious bone lesions identified.   IMPRESSION: No acute findings. No evidence of recurrent or metastatic carcinoma within the abdomen or pelvis.   Stable small benign left adrenal adenoma.     Assessment/Plan # Recurrent UTI # colonization with Serratia marcescens  I have personally spent 65 minutes involved in face-to-face and non-face-to-face activities for this patient on the day of the visit. Professional time spent includes the following activities: Preparing to see the patient (review of tests), Obtaining and/or reviewing separately obtained history (admission/discharge record), Performing a medically appropriate examination and/or evaluation , Ordering medications/tests/procedures, referring and communicating with other health care professionals, Documenting clinical information in the EMR, Independently interpreting results (not separately reported), Communicating results to the patient/family/caregiver, Counseling and educating the patient/family/caregiver and Care coordination (not separately reported).   Wilber Oliphant, [facility] 09/03/2022, 6:22 AM

## 2022-09-04 DIAGNOSIS — Z936 Other artificial openings of urinary tract status: Secondary | ICD-10-CM | POA: Insufficient documentation

## 2022-09-04 DIAGNOSIS — A498 Other bacterial infections of unspecified site: Secondary | ICD-10-CM | POA: Insufficient documentation

## 2022-09-14 NOTE — Progress Notes (Signed)
Chief Complaint:   OBESITY [name] is here to discuss her progress with her obesity treatment plan along with follow-up of her obesity related diagnoses. [name] is on keeping a food journal and adhering to recommended goals of 1300 calories and 75+ grams of protein and states she is following her eating plan approximately 60% of the time. [name] states she is walking for 30 minutes 2 times per week.  Today's visit was #: 64 Starting weight: 208 lbs Starting date: 04/05/2018 Today's weight: 212 lbs Today's date: 09/02/2022 Total lbs lost to date: 0 Total lbs lost since last in-office visit: 0  Interim History: [name] has been dealing with recurrent urine infections and this has made weight loss more difficult.  She is followed by nephrology and will be seeing infectious disease soon.  Her goal is to get these issues improved and then get back to a structured eating plan for weight loss.  Subjective:   1. Type 2 diabetes mellitus with other specified complication, unspecified whether long term insulin use (HCC) [name] is on Mounjaro 10 mg but she is still struggling with polyphagia.  She is open to increasing her dose.  2. Emotional Eating Behavior [name] has been out of Wellbutrin.  She is struggling with weight loss and stopping this may have contributed to her weight gain.  Assessment/Plan:   1. Type 2 diabetes mellitus with other specified complication, unspecified whether long term insulin use (HCC) [name] agreed to increase Mounjaro to 12.5 mg once weekly, and we will refill for 90 days.  - tirzepatide (MOUNJARO) 12.5 MG/0.5ML Pen; Inject 12.5 mg into the skin once a week.  Dispense: 6 mL; Refill: 0  2. Emotional Eating Behavior [name] will continue Wellbutrin XL, and we will refill for 90 days.  - buPROPion (WELLBUTRIN XL) 150 MG 24 hr tablet; Take 1 tablet (150 mg total) by mouth daily.  Dispense: 90 tablet; Refill: 0  3. BMI 40.0-44.9, adult (HCC)  4. Obesity, Beginning BMI  38.04 [name] is currently in the action stage of change. As such, her goal is to continue with weight loss efforts. She has agreed to keeping a food journal and adhering to recommended goals of 1300 calories and 75+ grams of protein daily.   Exercise goals: As is.   Behavioral modification strategies: increasing water intake and better snacking choices.  [name] has agreed to follow-up with our clinic in 4 weeks. She was informed of the importance of frequent follow-up visits to maximize her success with intensive lifestyle modifications for her multiple health conditions.   Objective:   Blood pressure 125/68, pulse 81, temperature 97.6 F (36.4 C), height 5\' 1"  (1.549 m), weight 212 lb (96.2 kg), SpO2 95 %. Body mass index is 40.06 kg/m.  General: Cooperative, alert, well developed, in no acute distress. HEENT: Conjunctivae and lids unremarkable. Cardiovascular: Regular rhythm.  Lungs: Normal work of breathing. Neurologic: No focal deficits.   Lab Results  Component Value Date   CREATININE 2.42 (H) 08/19/2022   BUN 35 (H) 08/19/2022   NA 138 08/19/2022   K 4.3 08/19/2022   CL 105 08/19/2022   CO2 24 08/19/2022   Lab Results  Component Value Date   ALT 26 08/19/2022   AST 18 08/19/2022   ALKPHOS 194 (H) 08/19/2022   BILITOT 0.3 08/19/2022   Lab Results  Component Value Date   HGBA1C 6.1 02/15/2022   HGBA1C 6.0 07/23/2021   HGBA1C 6.3 (H) 06/16/2020   HGBA1C 7.9 (H) 09/01/2017  HGBA1C 5.3 04/01/2017   Lab Results  Component Value Date   INSULIN 14.1 06/16/2020   Lab Results  Component Value Date   TSH 3.60 08/19/2022   Lab Results  Component Value Date   CHOL 66 08/19/2022   HDL 36.40 (L) 08/19/2022   LDLCALC 10 08/19/2022   TRIG 99.0 08/19/2022   CHOLHDL 2 08/19/2022   Lab Results  Component Value Date   VD25OH 31.09 02/15/2022   VD25OH 25.9 (L) 06/16/2020   VD25OH 40.8 04/05/2018   Lab Results  Component Value Date   WBC 6.5 08/19/2022   HGB 12.5  08/19/2022   HCT 37.2 08/19/2022   MCV 91.1 08/19/2022   PLT 240.0 08/19/2022   Lab Results  Component Value Date   IRON 7 (L) 08/31/2017   TIBC 164 (L) 08/31/2017   FERRITIN 27 11/29/2014   Attestation Statements:   Reviewed by clinician on day of visit: allergies, medications, problem list, medical history, surgical history, family history, social history, and previous encounter notes.   I, Burt Knack, am acting as transcriptionist for Quillian Quince, MD.  I have reviewed the above documentation for accuracy and completeness, and I agree with the above. -  Quillian Quince, MD

## 2022-09-15 IMAGING — DX DG CHEST 2V
2 series · 2 of 2 positions shown · non-contrast
Comparison: Chest radiograph dated 01/19/2019.

CLINICAL DATA: Anxiety.

EXAM:
CHEST - 2 VIEW

[chest pa]
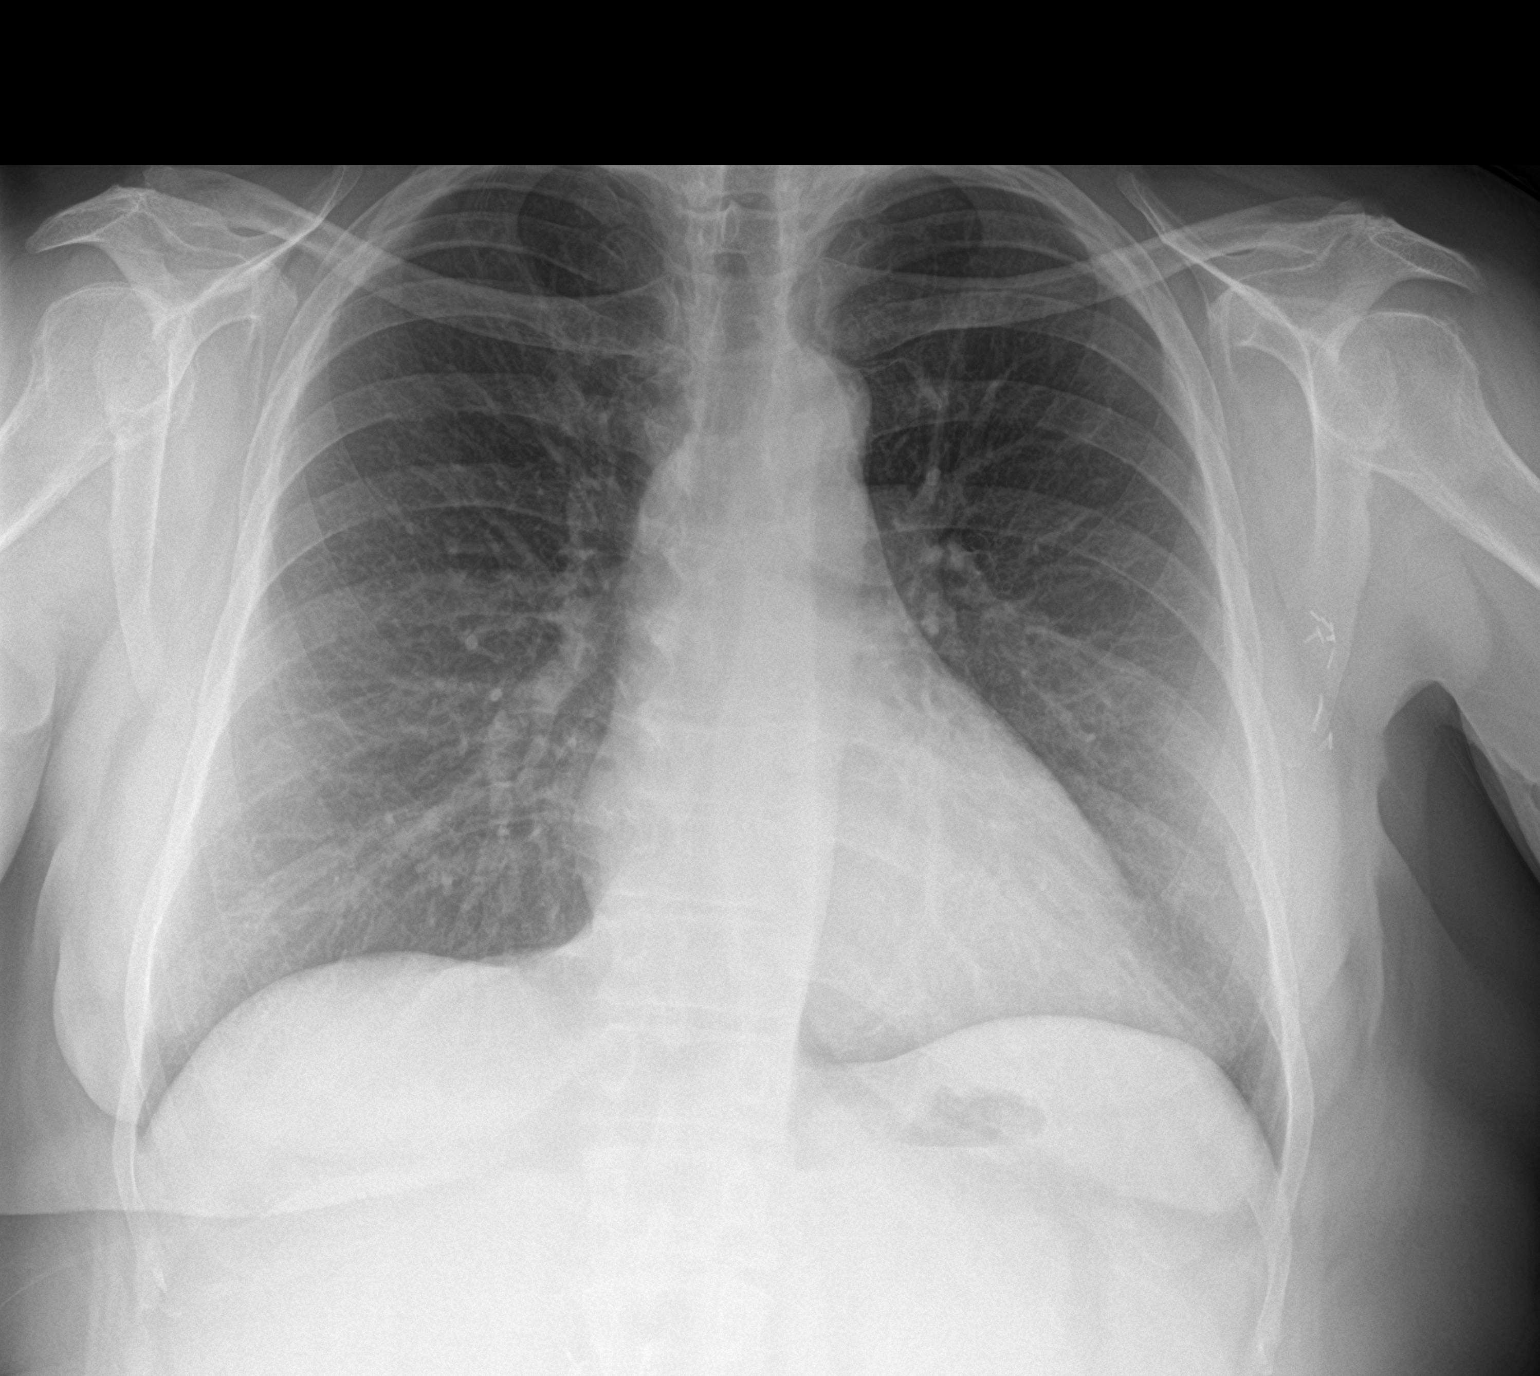

[chest lat]
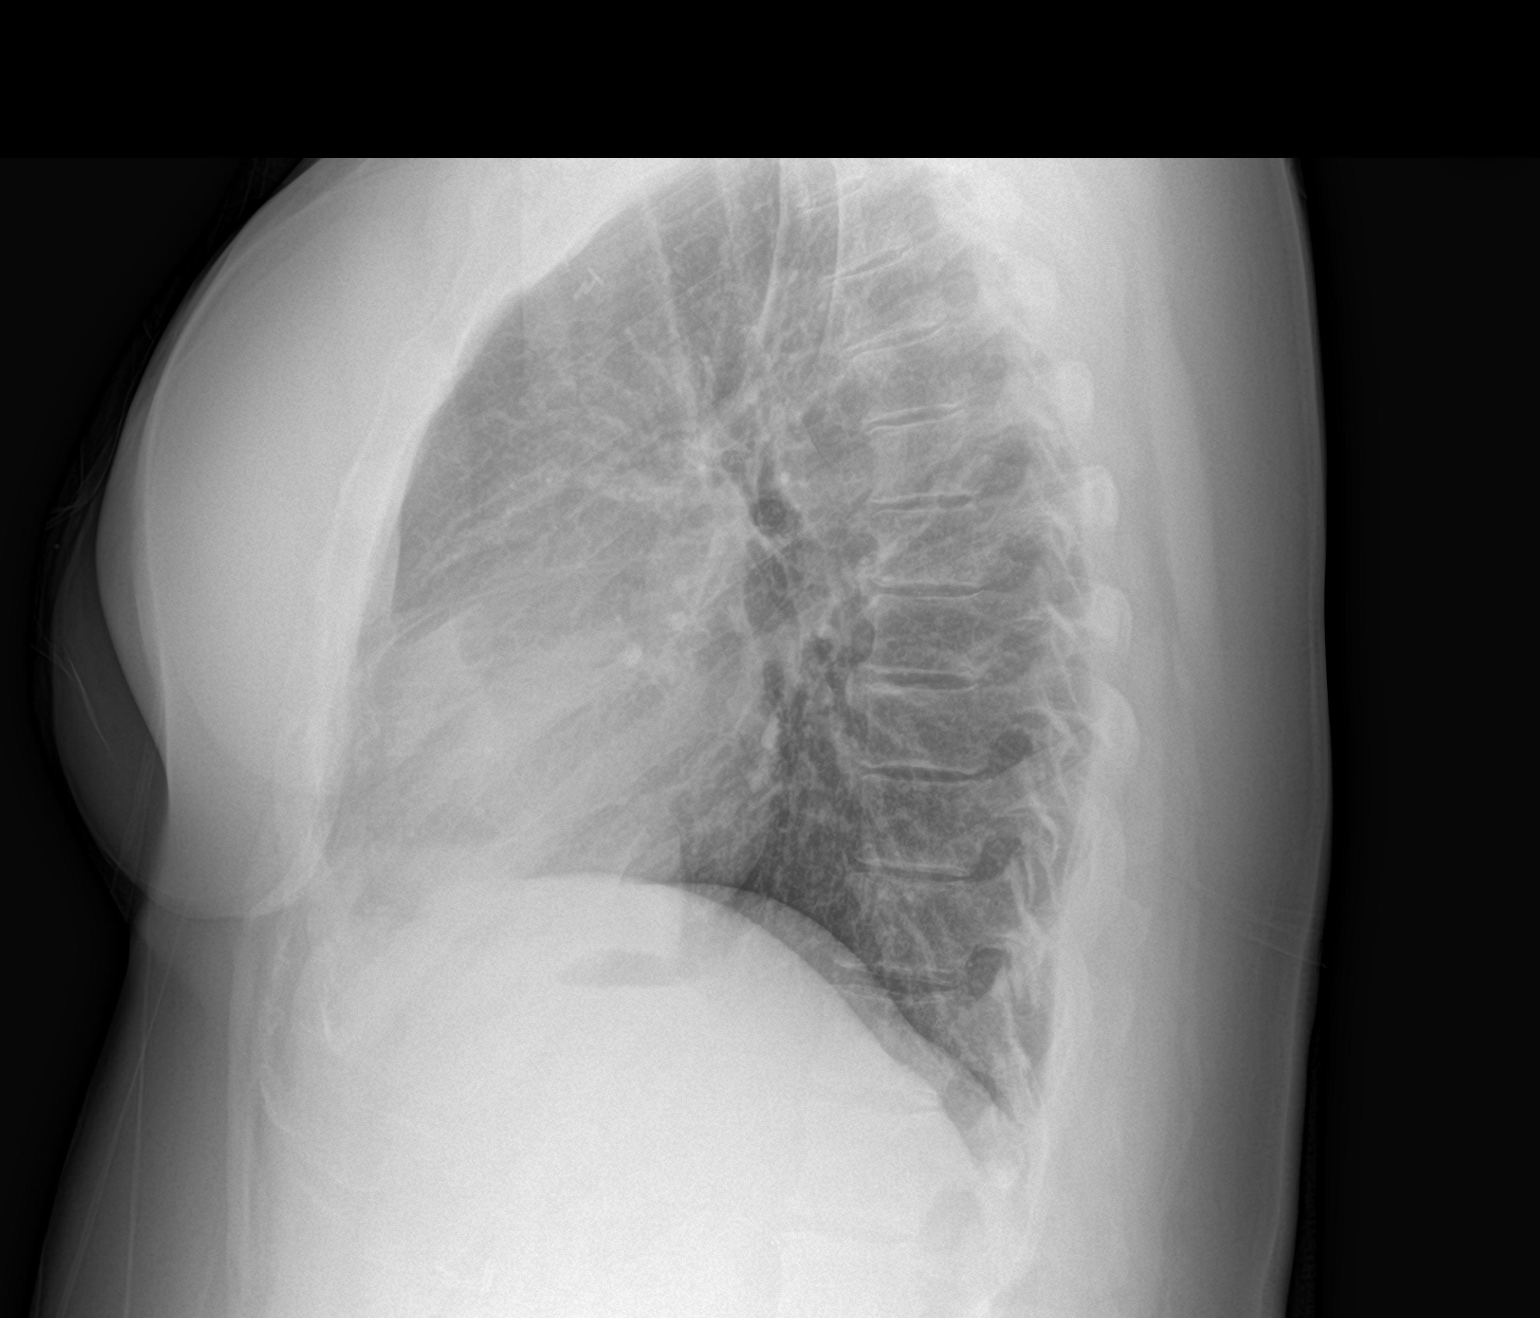

[2 of 2 positions shown; findings below may reference images not displayed]

FINDINGS: No focal consolidation, pleural effusion, pneumothorax. The cardiac
silhouette is within limits. Surgical clips over the left chest
wall. Bilateral breast implants. No acute osseous pathology.
IMPRESSION: No active cardiopulmonary disease.

## 2022-09-19 ENCOUNTER — Other Ambulatory Visit (INDEPENDENT_AMBULATORY_CARE_PROVIDER_SITE_OTHER): Payer: Self-pay | Admitting: Family Medicine

## 2022-09-19 DIAGNOSIS — E1169 Type 2 diabetes mellitus with other specified complication: Secondary | ICD-10-CM

## 2022-09-20 ENCOUNTER — Encounter: Payer: Self-pay | Admitting: *Deleted

## 2022-09-28 ENCOUNTER — Telehealth: Payer: Self-pay

## 2022-09-28 ENCOUNTER — Telehealth (INDEPENDENT_AMBULATORY_CARE_PROVIDER_SITE_OTHER): Payer: Self-pay | Admitting: Family Medicine

## 2022-09-28 DIAGNOSIS — E1169 Type 2 diabetes mellitus with other specified complication: Secondary | ICD-10-CM

## 2022-09-28 MED ORDER — TIRZEPATIDE 12.5 MG/0.5ML ~~LOC~~ SOAJ: 12.5000 mg | SUBCUTANEOUS | 0 refills | Status: DC

## 2022-09-28 NOTE — Telephone Encounter (Signed)
09/28/2022 Patient is waiting for the prior auth for monjuor to be approve. Patient is currently at the [facility]. Patient would also like a call back. Ardeen Fillers

## 2022-09-28 NOTE — Telephone Encounter (Signed)
PA submitted through Cover My Meds for [facility]. Per Cover My Meds: This request has been approved using information available on the patient's profile. [id number];Review Type:Prior Auth;Coverage Start Date:08/29/2022;Coverage End Date:09/28/2023;

## 2022-09-28 NOTE — Telephone Encounter (Signed)
Patient has appointment 09/30/22. Medication sent to pharmacy.

## 2022-09-30 ENCOUNTER — Encounter (INDEPENDENT_AMBULATORY_CARE_PROVIDER_SITE_OTHER): Payer: Self-pay | Admitting: Family Medicine

## 2022-09-30 ENCOUNTER — Ambulatory Visit (INDEPENDENT_AMBULATORY_CARE_PROVIDER_SITE_OTHER): Payer: BC Managed Care – PPO | Admitting: Family Medicine

## 2022-09-30 VITALS — BP 116/70 | HR 70 | Temp 97.9°F | Ht 61.0 in | Wt 212.0 lb

## 2022-09-30 DIAGNOSIS — Z7985 Long-term (current) use of injectable non-insulin antidiabetic drugs: Secondary | ICD-10-CM

## 2022-09-30 DIAGNOSIS — E669 Obesity, unspecified: Secondary | ICD-10-CM | POA: Diagnosis not present

## 2022-09-30 DIAGNOSIS — Z6838 Body mass index (BMI) 38.0-38.9, adult: Secondary | ICD-10-CM

## 2022-09-30 DIAGNOSIS — N1832 Chronic kidney disease, stage 3b: Secondary | ICD-10-CM | POA: Diagnosis not present

## 2022-09-30 DIAGNOSIS — Z6841 Body Mass Index (BMI) 40.0 and over, adult: Secondary | ICD-10-CM

## 2022-09-30 DIAGNOSIS — E1169 Type 2 diabetes mellitus with other specified complication: Secondary | ICD-10-CM

## 2022-10-04 ENCOUNTER — Encounter: Payer: Self-pay | Admitting: Hematology and Oncology

## 2022-10-04 ENCOUNTER — Inpatient Hospital Stay: Payer: BC Managed Care – PPO | Attending: Hematology and Oncology

## 2022-10-04 ENCOUNTER — Inpatient Hospital Stay (HOSPITAL_BASED_OUTPATIENT_CLINIC_OR_DEPARTMENT_OTHER): Payer: BC Managed Care – PPO | Admitting: Hematology and Oncology

## 2022-10-04 VITALS — BP 152/69 | HR 82 | Resp 18 | Ht 61.0 in | Wt 218.6 lb

## 2022-10-04 DIAGNOSIS — Z8542 Personal history of malignant neoplasm of other parts of uterus: Secondary | ICD-10-CM | POA: Insufficient documentation

## 2022-10-04 DIAGNOSIS — D709 Neutropenia, unspecified: Secondary | ICD-10-CM | POA: Insufficient documentation

## 2022-10-04 DIAGNOSIS — Z853 Personal history of malignant neoplasm of breast: Secondary | ICD-10-CM | POA: Insufficient documentation

## 2022-10-04 DIAGNOSIS — Z8543 Personal history of malignant neoplasm of ovary: Secondary | ICD-10-CM | POA: Diagnosis not present

## 2022-10-04 LAB — CBC WITH DIFFERENTIAL/PLATELET
Abs Immature Granulocytes: 0.35 10*3/uL — ABNORMAL HIGH (ref 0.00–0.07)
Basophils Absolute: 0.1 10*3/uL (ref 0.0–0.1)
Basophils Relative: 0 %
Eosinophils Absolute: 0.1 10*3/uL (ref 0.0–0.5)
Eosinophils Relative: 0 %
HCT: 37.9 % (ref 36.0–46.0)
Hemoglobin: 12.2 g/dL (ref 12.0–15.0)
Immature Granulocytes: 2 %
Lymphocytes Relative: 5 %
Lymphs Abs: 1 10*3/uL (ref 0.7–4.0)
MCH: 30.3 pg (ref 26.0–34.0)
MCHC: 32.2 g/dL (ref 30.0–36.0)
MCV: 94.3 fL (ref 80.0–100.0)
Monocytes Absolute: 1 10*3/uL (ref 0.1–1.0)
Monocytes Relative: 5 %
Neutro Abs: 17.9 10*3/uL — ABNORMAL HIGH (ref 1.7–7.7)
Neutrophils Relative %: 88 %
Platelets: 186 10*3/uL (ref 150–400)
RBC: 4.02 MIL/uL (ref 3.87–5.11)
RDW: 14.4 % (ref 11.5–15.5)
WBC: 20.4 10*3/uL — ABNORMAL HIGH (ref 4.0–10.5)
nRBC: 0 % (ref 0.0–0.2)

## 2022-10-04 MED ORDER — TBO-FILGRASTIM 480 MCG/0.8ML ~~LOC~~ SOSY: PREFILLED_SYRINGE | SUBCUTANEOUS | 11 refills | Status: DC

## 2022-10-04 NOTE — Progress Notes (Signed)
[facility] OFFICE PROGRESS NOTE  Patient Care Team: Carollee Herter, Alferd Apa, DO as PCP - General (Family Medicine) Vickie Epley, MD as PCP - Electrophysiology (Cardiology) Heath Lark, MD as Consulting Physician (Hematology and Oncology) Michael [name], MD as Consulting Physician (General Surgery) Alexis Frock, MD as Consulting Physician (Urology) Katy Apo, MD as Consulting Physician (Ophthalmology) Irene Limbo, MD as Consulting Physician (Plastic Surgery) Madelon Lips, MD as Consulting Physician (Nephrology) Starlyn Skeans, MD as Consulting Physician (Family Medicine) Rocky Morel, MD as Referring Physician (Plastic Surgery) Ponciano Ort, MD as Referring Physician (Urology) Freada Bergeron, MD as Consulting Physician (Cardiology) Marcene Corning, MD as Referring Physician (Internal Medicine) Lafonda Mosses, MD as Consulting Physician (Gynecologic Oncology)  ASSESSMENT & PLAN:  Chronic neutropenia [facility]) She is doing well with Granix injection every 6 days She is doing well and had no recurrent infectious episode Her quite count is high today I recommend lengthening the interval between injection to every 7 days We will continue indefinitely Chronic treatment with Granix has reduce the risk of hospitalization and sepsis  History of ovarian & endometrial cancer She will continue follow-up with GYN oncologist  History of left breast cancer The area of concern was evaluated by ultrasound in December It is palpable along surgical incision site I reassured the patient this appears to be benign in appearance  No orders of the defined types were placed in this encounter.   All questions were answered. The patient knows to call the clinic with any problems, questions or concerns. The total time spent in the appointment was 20 minutes encounter with patients including review of chart and various tests results, discussions  about plan of care and coordination of care plan   Heath Lark, MD 10/04/2022 12:48 PM  INTERVAL HISTORY: Please see below for problem oriented charting. she returns for treatment follow-up for history of multiple malignancies and chronic neutropenia She is doing well considering all her history She denies recent severe infection or sepsis She palpated nodularity along the right breast area, evaluated by her plastic surgeon and ultrasound  REVIEW OF SYSTEMS:   Constitutional: Denies fevers, chills or abnormal weight loss Eyes: Denies blurriness of vision Ears, nose, mouth, throat, and face: Denies mucositis or sore throat Respiratory: Denies cough, dyspnea or wheezes Cardiovascular: Denies palpitation, chest discomfort or lower extremity swelling Gastrointestinal:  Denies nausea, heartburn or change in bowel habits Skin: Denies abnormal skin rashes Lymphatics: Denies new lymphadenopathy or easy bruising Neurological:Denies numbness, tingling or new weaknesses Behavioral/Psych: Mood is stable, no new changes  All other systems were reviewed with the patient and are negative.  I have reviewed the past medical history, past surgical history, social history and family history with the patient and they are unchanged from previous note.  ALLERGIES:  is allergic to penicillins, erythromycin, tape, trimethoprim, ultram [tramadol], zarxio [filgrastim], cephalosporins, fluconazole, neomycin, oxycodone, pectin, septra [sulfamethoxazole-trimethoprim], and sulfa antibiotics.  MEDICATIONS:  Current Outpatient Medications  Medication Sig Dispense Refill   Cranberry 50 MG CHEW Chew by mouth.     aspirin EC 81 MG tablet Take 1 tablet (81 mg total) by mouth daily. Swallow whole. 90 tablet 3   Biotin 5 MG TABS Take 5 mg by mouth daily.      buPROPion (WELLBUTRIN XL) 150 MG 24 hr tablet Take 1 tablet (150 mg total) by mouth daily. 90 tablet 0   calcium citrate-vitamin D (CITRACAL+D) 315-200 MG-UNIT  tablet Take 1 tablet by mouth daily.  Cholecalciferol (VITAMIN D3) 10000 UNITS capsule Take 10,000 Units by mouth See admin instructions. Take 1 tablet by mouth 43times per week.     clobetasol (OLUX) 0.05 % topical foam Apply topically 2 (two) times daily. 50 g 3   diphenhydrAMINE (BENADRYL) 25 mg capsule Take 1 capsule (25 mg total) by mouth every 8 (eight) hours as needed for itching, allergies or sleep. 30 capsule 0   famotidine-calcium carbonate-magnesium hydroxide (PEPCID COMPLETE) 10-800-165 MG chewable tablet Chew 1 tablet by mouth as needed.     ferrous sulfate 325 (65 FE) MG tablet Take 1 tablet (325 mg total) by mouth at bedtime. 30 tablet 3   fluticasone (FLONASE) 50 MCG/ACT nasal spray Place 2 sprays into both nostrils daily. 16 g 6   HYDROcodone bit-homatropine (HYCODAN) 5-1.5 MG/5ML syrup Take 5 mLs by mouth every 6 (six) hours as needed for cough. 120 mL 0   levothyroxine (SYNTHROID, LEVOTHROID) 150 MCG tablet Take 1 tablet (150 mcg total) by mouth daily before breakfast. (Patient taking differently: Take 150 mcg by mouth daily before breakfast. Twice a day Wednesday and Sunday) 30 tablet 1   loratadine (CLARITIN) 10 MG tablet Take 10 mg by mouth daily.      omega-3 acid ethyl esters (LOVAZA) 1 G capsule Take 1 g by mouth 2 (two) times daily.      Polyethyl Glycol-Propyl Glycol (SYSTANE OP) Place 1 drop into both eyes daily as needed (dry eyes).      pregabalin (LYRICA) 50 MG capsule TAKE 1 CAPSULE TWICE A DAY 60 capsule 5   Prenatal Vit-Fe Fumarate-FA (PRENATAL VITAMIN PO) Take 1 capsule by mouth daily. Takes prenatal because there are no dyes in it     Probiotic Product (ALIGN PO) Take 1 capsule by mouth daily.     rosuvastatin (CRESTOR) 10 MG tablet Take 10 mg by mouth every evening.     Saccharomyces boulardii (FLORASTOR PO) Take 1 capsule by mouth at bedtime.     Tazarotene (ARAZLO) 0.045 % LOTN Apply 1 application topically daily. 45 g 0   Tbo-Filgrastim (GRANIX) 480  MCG/0.8ML SOSY injection 480 mcg subcutaneous injection every 7 days life long480 mcg subcutaneous injection every 7 days life long 11.2 mL 11   tirzepatide (MOUNJARO) 12.5 MG/0.5ML Pen Inject 12.5 mg into the skin once a week. 6 mL 0   tiZANidine (ZANAFLEX) 4 MG tablet Take 0.5-1 tablets (2-4 mg total) by mouth every 8 (eight) hours as needed for muscle spasms. 180 tablet 3   XIFAXAN 550 MG TABS tablet Take 550 mg by mouth as needed.     No current facility-administered medications for this visit.    SUMMARY OF ONCOLOGIC HISTORY: Oncology History Overview Note  Breast cancer of upper-inner quadrant of left female breast ([place])   Staging form: Breast, AJCC 7th Edition     Clinical stage from 12/31/2015: Stage IIA (T2, N0, M0) - Signed by Heath Lark, MD on 12/31/2015  ER,PR positive Her2 neg   History of ovarian & endometrial cancer  10/15/2004 Initial Diagnosis   History of ovarian cancer, treated with chemotherapy carbo/Taxol   03/17/2009 Bone Marrow Biopsy   Bone marrow [facility] at [facility] showed neutropenia   01/23/2014 Bone Marrow Biopsy   Repeat bone marrow biopsy showed neutropenia   09/12/2014 Imaging   CT scan showed possible cancer   10/14/2014 Imaging   MRI show significant pelvic mass without invasion into the bladder but abutting to the rectum   11/19/2014 Surgery   she  underwent surgery and had robotic-assisted lysis of adhesions, converted to laparotomy, radical upper vaginectomy and low anterior resection with colostomy. 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 Resection margins were negative   01/29/2015 - 03/10/2015 Radiation Therapy   She received adjuvant radiation   03/03/2015 Imaging   CT scan of the abdomen and pelvis showed status post interval removal of the bilateral  nephroureteral stents. Worsening moderate right hydroureteronephrosis. Resolved left hydroureteronephrosis.   03/20/2015 Procedure   she had cystoscopy and stent placement for right hydronephrosis   05/02/2015 Surgery   Cystoscopy with right retrograde pyelogram interpretation. Diagnostic ureteroscopy. Removal of right ureteral stent.   05/14/2015 Imaging   CT scan of the abdomen and pelvis show unilateral right hydronephrosis with no residual cancer   08/18/2015 Imaging   CT scan showed hysterectomy with stable presacral soft tissue thickening. No definitive evidence of recurrent or metastatic disease. Severe right hydronephrosis   09/12/2015 Surgery   Cystoscopy with right retrograde pyelogram interpretation. Right ureteral stent placement 5 x 24 Polaris, no tether   10/07/2016 Imaging   Stable exam.  No new or progressive findings. 2. Stable appearance abnormal presacral soft tissue compatible with post treatment change. 3. Internal right ureteral stent with persistent right hydroureteronephrosis. Differentially decreased perfusion of the right kidney suggests a component of underlying obstructive uropathy. 4. Stable 13 mm left adrenal nodule, previously characterized as adenoma.       01/27/2017 Imaging   Stable postop and post radiation changes in presacral region. No evidence of recurrent or metastatic carcinoma within the chest, abdomen, or pelvis. Stable mild to moderate right hydroureteronephrosis, with right ureteral stent in appropriate position. Stable small benign left adrenal adenoma and left thyroid lobe nodule.   10/02/2019 Tumor Marker   Patient's tumor was tested for the following markers: CA-125 Results of the tumor marker test revealed 5.8   Breast cancer of upper-inner quadrant of left female breast ([place])  10/14/2015 Imaging   Screening mammogram showed possible distortion in the left breast.   10/24/2015 Imaging   Targeted ultrasound is performed, showing a 0.6 x 0.8 x  0.9 cm area of hypoechoic distortion at the 10 o'clock position of the left breast 5 cm from the nipple   10/24/2015 Imaging   Diagnostic imaging confirmed 0.6 x 0.8 x 0.9 cm distortion in the upper inner left breast   10/30/2015 Procedure   Left US guided biopsy was performed   10/30/2015 Pathology Results   Accession: [id number] showed invasive ductal carcinoma, ER/PR positive, Her2 neg   11/05/2015 Genetic Testing   Genetic testing did not reveal a deleterious mutation.  Genetic testing did detect a Variant of Unknown Significance in the MSH6 gene called c.389A>G..  Genes tested include: APC, ATM, AXIN2, BARD1, BMPR1A, BRCA1, BRCA2, BRIP1, CDH1, CDKN2A, CHEK2, DICER1, EPCAM, GREM1, KIT, MEN1, MLH1, MSH2, MSH6, MUTYH, NBN, NF1, PALB2, PDGFRA, PMS2, POLD1, POLE, PTEN, RAD50, RAD51C, RAD51D, SDHA, SDHB, SDHC, SDHD, SMAD4, SMARCA4. STK11, TP53, TSC1, TSC2, and VHL.  UPDATE: MSH6 c.389A>G (p.His130Arg) has been reclassified as Likely Benign.  The amended report date is February 25, 2021.   12/11/2015 Surgery   She undwerwent bilateral mastectomy, left SLN biopsy and immediate reconstruction surgeries with bilateral plasma expanders   12/11/2015 Pathology Results   Accession: [id number] mastectomy specimens showed left breast ca, pT2N0M0   12/26/2015 Pathology Results   Accession:  [id number] specimen from debridement showed no cancer   12/26/2015 Surgery   she underwent surgical debridement of necrotic tissue at site of plasma expander   01/12/2016 Imaging   Bone density is normal   02/02/2016 -  Anti-estrogen oral therapy   She started taking Arimidex   10/21/2017 Procedure   Ultrasound guided biopsy of a vague hypoechoic shadowing lesion, generally corresponding to area palpable abnormality, along chest lateral to the reconstructed left breast and inferior to the left axilla. No apparent complications.   01/17/2018 Imaging   Bone density scan is normal     PHYSICAL EXAMINATION: ECOG  PERFORMANCE STATUS: 0 - Asymptomatic  Vitals:   10/04/22 1223  BP: (!) 152/69  Pulse: 82  Resp: 18  SpO2: 100%   Filed Weights   10/04/22 1223  Weight: 218 lb 9.6 oz (99.2 kg)    GENERAL:alert, no distress and comfortable SKIN: skin color, texture, turgor are normal, no rashes or significant lesions EYES: normal, Conjunctiva are pink and non-injected, sclera clear OROPHARYNX:no exudate, no erythema and lips, buccal mucosa, and tongue normal  NECK: supple, thyroid normal size, non-tender, without nodularity LYMPH:  no palpable lymphadenopathy in the cervical, axillary or inguinal LUNGS: clear to auscultation and percussion with normal breathing effort HEART: regular rate & rhythm and no murmurs and no lower extremity edema ABDOMEN:abdomen soft, non-tender and normal bowel sounds.  Extensive surgical scars Musculoskeletal:no cyanosis of digits and no clubbing  NEURO: alert & oriented x 3 with fluent speech, no focal motor/sensory deficits Bilateral chest wall examination was performed.  Implants in situ.  Noted nodularity along surgical scar on the right breast area along the incisions near the nipple  LABORATORY DATA:  I have reviewed the data as listed    Component Value Date/Time   NA 138 08/19/2022 1010   NA 141 01/11/2022 1544   NA 141 01/24/2017 1228   K 4.3 08/19/2022 1010   K 4.6 01/24/2017 1228   CL 105 08/19/2022 1010   CO2 24 08/19/2022 1010   CO2 29 01/24/2017 1228   GLUCOSE 145 (H) 08/19/2022 1010   GLUCOSE 84 01/24/2017 1228   BUN 35 (H) 08/19/2022 1010   BUN 29 (H) 01/11/2022 1544   BUN 22.2 01/24/2017 1228   CREATININE 2.42 (H) 08/19/2022 1010   CREATININE 2.20 (H) 06/04/2022 1450   CREATININE 1.91 (H) 01/18/2020 1440   CREATININE 0.8 01/24/2017 1228   CALCIUM 8.8 08/19/2022 1010   CALCIUM 10.2 01/24/2017 1228   PROT 7.7 08/19/2022 1010   PROT 7.8 06/16/2020 0834   PROT 6.9 01/24/2017 1228   ALBUMIN 4.0 08/19/2022 1010   ALBUMIN 4.4 06/16/2020 0834    ALBUMIN 3.4 (L) 01/24/2017 1228   AST 18 08/19/2022 1010   AST 12 (L) 06/04/2022 1450   AST 16 01/24/2017 1228   ALT 26 08/19/2022 1010   ALT 14 06/04/2022 1450   ALT 14 01/24/2017 1228   ALKPHOS 194 (H) 08/19/2022 1010   ALKPHOS 90 01/24/2017 1228   BILITOT 0.3 08/19/2022 1010   BILITOT 0.6 06/04/2022 1450   BILITOT 0.34 01/24/2017 1228   GFRNONAA 24 (L) 06/04/2022 1450   GFRNONAA 78 12/16/2014 1530   GFRAA 38 (L) 06/16/2020 0834   GFRAA 30 (L) 03/27/2018 1324   GFRAA >89 12/16/2014 1530    No results found for: "SPEP", "UPEP"  Lab Results  Component Value Date   WBC 20.4 (H) 10/04/2022   NEUTROABS 17.9 (H) 10/04/2022   HGB 12.2 10/04/2022   HCT  37.9 10/04/2022   MCV 94.3 10/04/2022   PLT 186 10/04/2022      Chemistry      Component Value Date/Time   NA 138 08/19/2022 1010   NA 141 01/11/2022 1544   NA 141 01/24/2017 1228   K 4.3 08/19/2022 1010   K 4.6 01/24/2017 1228   CL 105 08/19/2022 1010   CO2 24 08/19/2022 1010   CO2 29 01/24/2017 1228   BUN 35 (H) 08/19/2022 1010   BUN 29 (H) 01/11/2022 1544   BUN 22.2 01/24/2017 1228   CREATININE 2.42 (H) 08/19/2022 1010   CREATININE 2.20 (H) 06/04/2022 1450   CREATININE 1.91 (H) 01/18/2020 1440   CREATININE 0.8 01/24/2017 1228   GLU 131 11/29/2018 0000      Component Value Date/Time   CALCIUM 8.8 08/19/2022 1010   CALCIUM 10.2 01/24/2017 1228   ALKPHOS 194 (H) 08/19/2022 1010   ALKPHOS 90 01/24/2017 1228   AST 18 08/19/2022 1010   AST 12 (L) 06/04/2022 1450   AST 16 01/24/2017 1228   ALT 26 08/19/2022 1010   ALT 14 06/04/2022 1450   ALT 14 01/24/2017 1228   BILITOT 0.3 08/19/2022 1010   BILITOT 0.6 06/04/2022 1450   BILITOT 0.34 01/24/2017 1228

## 2022-10-04 NOTE — Assessment & Plan Note (Signed)
The area of concern was evaluated by ultrasound in December It is palpable along surgical incision site I reassured the patient this appears to be benign in appearance

## 2022-10-04 NOTE — Assessment & Plan Note (Signed)
She will continue follow-up with GYN oncologist

## 2022-10-04 NOTE — Assessment & Plan Note (Signed)
She is doing well with Granix injection every 6 days She is doing well and had no recurrent infectious episode Her quite count is high today I recommend lengthening the interval between injection to every 7 days We will continue indefinitely Chronic treatment with Granix has reduce the risk of hospitalization and sepsis

## 2022-10-11 DIAGNOSIS — Z936 Other artificial openings of urinary tract status: Secondary | ICD-10-CM | POA: Diagnosis not present

## 2022-10-11 DIAGNOSIS — Z4801 Encounter for change or removal of surgical wound dressing: Secondary | ICD-10-CM | POA: Diagnosis not present

## 2022-10-11 DIAGNOSIS — L89319 Pressure ulcer of right buttock, unspecified stage: Secondary | ICD-10-CM | POA: Diagnosis not present

## 2022-10-11 DIAGNOSIS — Z933 Colostomy status: Secondary | ICD-10-CM | POA: Diagnosis not present

## 2022-10-18 NOTE — Progress Notes (Signed)
Chief Complaint:   OBESITY Delorse is here to discuss her progress with her obesity treatment plan along with follow-up of her obesity related diagnoses. Romola is on keeping a food journal and adhering to recommended goals of 1300 calories and 75+ grams of protein and states she is following her eating plan approximately 65% of the time. Leaanne states she is doing 0 minutes 0 times per week.  Today's visit was #: 30 Starting weight: 208 lbs Starting date: 04/05/2018 Today's weight: 212 lbs Today's date: 09/30/2022 Total lbs lost to date: 0 Total lbs lost since last in-office visit: 0  Interim History: Novalyn has had a lot of celebrations in the last month. She had a brief bowel obstruction that resolved relatively quickly.   Subjective:   1. Chronic renal impairment, stage 3b (HCC) Belladonna's last creatinine and BUN were elevated. She is not drinking much water.   2. Type 2 diabetes mellitus with other specified complication, unspecified whether long term insulin use ([place]) Tasmine has been out of [place] for 1 week due to pharmacy shortages.   Assessment/Plan:   1. Chronic renal impairment, stage 3b (HCC) [name] will continue work on increasing water and we will recheck labs in 2 months.   2. Type 2 diabetes mellitus with other specified complication, unspecified whether long term insulin use ([place]) Nekeia will continue Mounjaro at 12.5 mg, she will need to repeat a prior authorization in 2 more month.   3. BMI 40.0-44.9, adult ([place])  4. Obesity, Beginning BMI 38.04 Calei is currently in the action stage of change. As such, her goal is to continue with weight loss efforts. She has agreed to practicing portion control and making smarter food choices, such as increasing vegetables and decreasing simple carbohydrates.   Quasia is to continue to be mindful and try to journal when possible.   Behavioral modification strategies: increasing lean protein intake or protein supplement, planning  for success, and keeping a strict food journal.  Cathrina has agreed to follow-up with our clinic in 4 weeks. She was informed of the importance of frequent follow-up visits to maximize her success with intensive lifestyle modifications for her multiple health conditions.   Objective:   Blood pressure 116/70, pulse 70, temperature 97.9 F (36.6 C), height 5\' 1"  (1.549 m), weight 212 lb (96.2 kg), SpO2 98 %. Body mass index is 40.06 kg/m.  Lab Results  Component Value Date   CREATININE 2.42 (H) 08/19/2022   BUN 35 (H) 08/19/2022   NA 138 08/19/2022   K 4.3 08/19/2022   CL 105 08/19/2022   CO2 24 08/19/2022   Lab Results  Component Value Date   ALT 26 08/19/2022   AST 18 08/19/2022   ALKPHOS 194 (H) 08/19/2022   BILITOT 0.3 08/19/2022   Lab Results  Component Value Date   HGBA1C 6.1 02/15/2022   HGBA1C 6.0 07/23/2021   HGBA1C 6.3 (H) 06/16/2020   HGBA1C 7.9 (H) 09/01/2017   HGBA1C 5.3 04/01/2017   Lab Results  Component Value Date   INSULIN 14.1 06/16/2020   Lab Results  Component Value Date   TSH 3.60 08/19/2022   Lab Results  Component Value Date   CHOL 66 08/19/2022   HDL 36.40 (L) 08/19/2022   LDLCALC 10 08/19/2022   TRIG 99.0 08/19/2022   CHOLHDL 2 08/19/2022   Lab Results  Component Value Date   VD25OH 31.09 02/15/2022   VD25OH 25.9 (L) 06/16/2020   VD25OH 40.8 04/05/2018   Lab Results  Component Value Date   WBC 20.4 (H) 10/04/2022   HGB 12.2 10/04/2022   HCT 37.9 10/04/2022   MCV 94.3 10/04/2022   PLT 186 10/04/2022   Lab Results  Component Value Date   IRON 7 (L) 08/31/2017   TIBC 164 (L) 08/31/2017   FERRITIN 27 11/29/2014   Attestation Statements:   Reviewed by clinician on day of visit: allergies, medications, problem list, medical history, surgical history, family history, social history, and previous encounter notes.   I, Trixie Dredge, am acting as transcriptionist for Dennard Nip, MD.  I have reviewed the above documentation  for accuracy and completeness, and I agree with the above. -  Dennard Nip, MD

## 2022-10-25 ENCOUNTER — Telehealth: Payer: Self-pay

## 2022-10-25 NOTE — Telephone Encounter (Signed)
[name] states that over the last 6 months she has had 3 episodes of of RLQ pain levels of 5-7/10.  Her her bowels will not moved through ostomy for a day or so and then she changes diet to liquids and then pain subsides and bowels move. She may take an occasional tylenol for pain. No narcotics. The RLQ pain is increasing with frequency especially if she drinks a protein drink.  She cannot eat dense meat due to  increased pain.  She has some nausea. No vomiting. She would like an appointment to be seen to determine if there are adhesions in this area or recurrence of cancer.  Told [name] Abercrombie that this message will be sent to Dr. Berline Lopes and Joylene John, NP to review.  [name] Read wanted to mention that if a scan is ordered that she has CKD.

## 2022-10-26 ENCOUNTER — Telehealth: Payer: Self-pay

## 2022-10-26 ENCOUNTER — Other Ambulatory Visit: Payer: Self-pay | Admitting: Gynecologic Oncology

## 2022-10-26 DIAGNOSIS — R1031 Right lower quadrant pain: Secondary | ICD-10-CM

## 2022-10-26 NOTE — Telephone Encounter (Signed)
(  See phone note from 3/25) Pt is scheduled for CT on 4/3 and follow up appointment with Dr.Tucker on 4/5 @ 1:00. Pt agrees to all dates/times

## 2022-10-26 NOTE — Telephone Encounter (Signed)
If pain has worsened, I'm happy to order a CT scan. I won't be able to see/feel adhesions on an exam (and imaging is frequently unable to see this). Given all the surgeries and treatment she has had, she likely has adhesions that are contributing to these symptoms. I will put in an order for a CT. Please offer her an appointment with me (when next available). This likely won't be for a couple of months so it would be great if we could keep her on a list so that if we have patients cancel, we could reach out to her for a sooner appointment.

## 2022-10-26 NOTE — Progress Notes (Unsigned)
Given increasing pain, CT A/P with PO contrast ONLY ordered.

## 2022-10-26 NOTE — Telephone Encounter (Signed)
Yes, that works. Thanks

## 2022-10-28 ENCOUNTER — Ambulatory Visit (INDEPENDENT_AMBULATORY_CARE_PROVIDER_SITE_OTHER): Payer: BC Managed Care – PPO | Admitting: Family Medicine

## 2022-10-28 ENCOUNTER — Encounter (INDEPENDENT_AMBULATORY_CARE_PROVIDER_SITE_OTHER): Payer: Self-pay | Admitting: Family Medicine

## 2022-10-28 VITALS — BP 128/78 | HR 74 | Temp 97.5°F | Ht 61.0 in | Wt 206.0 lb

## 2022-10-28 DIAGNOSIS — E669 Obesity, unspecified: Secondary | ICD-10-CM

## 2022-10-28 DIAGNOSIS — R1031 Right lower quadrant pain: Secondary | ICD-10-CM | POA: Diagnosis not present

## 2022-10-28 DIAGNOSIS — Z6839 Body mass index (BMI) 39.0-39.9, adult: Secondary | ICD-10-CM | POA: Diagnosis not present

## 2022-10-28 DIAGNOSIS — F3289 Other specified depressive episodes: Secondary | ICD-10-CM

## 2022-10-28 MED ORDER — BUPROPION HCL ER (XL) 150 MG PO TB24: 150.0000 mg | ORAL_TABLET | Freq: Every day | ORAL | 0 refills | Status: DC

## 2022-11-01 NOTE — Progress Notes (Unsigned)
Chief Complaint:   OBESITY [name] is here to discuss her progress with her obesity treatment plan along with follow-up of her obesity related diagnoses. [name] is on practicing portion control and making smarter food choices, such as increasing vegetables and decreasing simple carbohydrates and states she is following her eating plan approximately 75% of the time. [name] states she is doing 0 minutes 0 times per week.  Today's visit was #: 24 Starting weight: 208 lbs Starting date: 04/05/2018 Today's weight: 206 lbs Today's date: 10/28/2022 Total lbs lost to date: 2 Total lbs lost since last in-office visit: 6  Interim History: [name] has been having increased abdominal pain and this has limited her food intake.  She has lost 6 pounds, but she has not been getting appropriate nutrition.  She has a high output ileostomy and a history of bowel obstructions, but she does not think she is at that level yet.  Subjective:   1. Abdominal pain, RLQ [name] notes increased right lower quadrant pain, and she has a crescendo/decrescendo pattern. She is still getting output from her ileostomy, but less than normal although she is also eating less than normal. She has a CT scheduled next week as well as Oncology appointment next week.   2. Emotional Eating Behavior [name] has been stable on Wellbutrin and she requests a refill today.  Assessment/Plan:   1. Abdominal pain, RLQ [name] agreed to discontinue Mounjaro and sip liquids including protein drinks to help decrease malnutrition. She is familiar with how bowel obstructions feel, and she knows she must go to the emergency department if her pain worsens.  2. Emotional Eating Behavior [name] will continue Wellbutrin XL, and we will refill for 90 days.  - buPROPion (WELLBUTRIN XL) 150 MG 24 hr tablet; Take 1 tablet (150 mg total) by mouth daily.  Dispense: 90 tablet; Refill: 0  3. BMI 39.0-39.9,adult  4. Obesity, Beginning BMI 38.04 [name] is  currently in the action stage of change. As such, her goal is to continue with weight loss efforts. She has agreed to practicing portion control and making smarter food choices, such as increasing vegetables and decreasing simple carbohydrates.   [name] was encouraged to sip her water every 15 minutes, and it is okay to eat small amounts of bland food while healing.  We will go back to weight loss strategies as she is feeling well.  Behavioral modification strategies: increasing water intake.  [name] has agreed to follow-up with our clinic in 4 weeks. She was informed of the importance of frequent follow-up visits to maximize her success with intensive lifestyle modifications for her multiple health conditions.   Objective:   Blood pressure 128/78, pulse 74, temperature (!) 97.5 F (36.4 C), height 5\' 1"  (1.549 m), weight 206 lb (93.4 kg), SpO2 99 %. Body mass index is 38.92 kg/m.  Lab Results  Component Value Date   CREATININE 2.42 (H) 08/19/2022   BUN 35 (H) 08/19/2022   NA 138 08/19/2022   K 4.3 08/19/2022   CL 105 08/19/2022   CO2 24 08/19/2022   Lab Results  Component Value Date   ALT 26 08/19/2022   AST 18 08/19/2022   ALKPHOS 194 (H) 08/19/2022   BILITOT 0.3 08/19/2022   Lab Results  Component Value Date   HGBA1C 6.1 02/15/2022   HGBA1C 6.0 07/23/2021   HGBA1C 6.3 (H) 06/16/2020   HGBA1C 7.9 (H) 09/01/2017   HGBA1C 5.3 04/01/2017   Lab Results  Component Value Date  INSULIN 14.1 06/16/2020   Lab Results  Component Value Date   TSH 3.60 08/19/2022   Lab Results  Component Value Date   CHOL 66 08/19/2022   HDL 36.40 (L) 08/19/2022   LDLCALC 10 08/19/2022   TRIG 99.0 08/19/2022   CHOLHDL 2 08/19/2022   Lab Results  Component Value Date   VD25OH 31.09 02/15/2022   VD25OH 25.9 (L) 06/16/2020   VD25OH 40.8 04/05/2018   Lab Results  Component Value Date   WBC 20.4 (H) 10/04/2022   HGB 12.2 10/04/2022   HCT 37.9 10/04/2022   MCV 94.3 10/04/2022   PLT  186 10/04/2022   Lab Results  Component Value Date   IRON 7 (L) 08/31/2017   TIBC 164 (L) 08/31/2017   FERRITIN 27 11/29/2014   Attestation Statements:   Reviewed by clinician on day of visit: allergies, medications, problem list, medical history, surgical history, family history, social history, and previous encounter notes.  Time spent on visit including pre-visit chart review and post-visit care and charting was 42 minutes.   I, Trixie Dredge, am acting as transcriptionist for Dennard Nip, MD.  I have reviewed the above documentation for accuracy and completeness, and I agree with the above. -  Dennard Nip, MD

## 2022-11-02 ENCOUNTER — Other Ambulatory Visit: Payer: Self-pay | Admitting: Gynecologic Oncology

## 2022-11-02 DIAGNOSIS — R1031 Right lower quadrant pain: Secondary | ICD-10-CM

## 2022-11-02 DIAGNOSIS — Z8543 Personal history of malignant neoplasm of ovary: Secondary | ICD-10-CM

## 2022-11-02 DIAGNOSIS — Z8542 Personal history of malignant neoplasm of other parts of uterus: Secondary | ICD-10-CM

## 2022-11-03 ENCOUNTER — Ambulatory Visit (HOSPITAL_BASED_OUTPATIENT_CLINIC_OR_DEPARTMENT_OTHER)
Admission: RE | Admit: 2022-11-03 | Discharge: 2022-11-03 | Disposition: A | Discharge status: Home / Self Care | Payer: BC Managed Care – PPO | Source: Ambulatory Visit | Attending: Gynecologic Oncology | Admitting: Gynecologic Oncology

## 2022-11-03 DIAGNOSIS — Z8542 Personal history of malignant neoplasm of other parts of uterus: Secondary | ICD-10-CM | POA: Diagnosis not present

## 2022-11-03 DIAGNOSIS — Z8543 Personal history of malignant neoplasm of ovary: Secondary | ICD-10-CM | POA: Diagnosis not present

## 2022-11-03 DIAGNOSIS — R1031 Right lower quadrant pain: Secondary | ICD-10-CM | POA: Diagnosis not present

## 2022-11-03 DIAGNOSIS — N1330 Unspecified hydronephrosis: Secondary | ICD-10-CM | POA: Diagnosis not present

## 2022-11-04 ENCOUNTER — Encounter: Payer: Self-pay | Admitting: Gynecologic Oncology

## 2022-11-04 DIAGNOSIS — N390 Urinary tract infection, site not specified: Secondary | ICD-10-CM | POA: Diagnosis not present

## 2022-11-04 DIAGNOSIS — E1122 Type 2 diabetes mellitus with diabetic chronic kidney disease: Secondary | ICD-10-CM | POA: Diagnosis not present

## 2022-11-04 DIAGNOSIS — N1832 Chronic kidney disease, stage 3b: Secondary | ICD-10-CM | POA: Diagnosis not present

## 2022-11-04 DIAGNOSIS — E872 Acidosis, unspecified: Secondary | ICD-10-CM | POA: Diagnosis not present

## 2022-11-05 ENCOUNTER — Other Ambulatory Visit: Payer: Self-pay

## 2022-11-05 ENCOUNTER — Inpatient Hospital Stay: Payer: BC Managed Care – PPO | Attending: Gynecologic Oncology | Admitting: Gynecologic Oncology

## 2022-11-05 VITALS — BP 121/54 | HR 67 | Temp 98.2°F | Resp 16 | Ht 61.0 in | Wt 209.0 lb

## 2022-11-05 DIAGNOSIS — Z853 Personal history of malignant neoplasm of breast: Secondary | ICD-10-CM | POA: Diagnosis not present

## 2022-11-05 DIAGNOSIS — Z9079 Acquired absence of other genital organ(s): Secondary | ICD-10-CM | POA: Insufficient documentation

## 2022-11-05 DIAGNOSIS — Z8543 Personal history of malignant neoplasm of ovary: Secondary | ICD-10-CM | POA: Diagnosis not present

## 2022-11-05 DIAGNOSIS — Z8542 Personal history of malignant neoplasm of other parts of uterus: Secondary | ICD-10-CM | POA: Diagnosis not present

## 2022-11-05 DIAGNOSIS — R1031 Right lower quadrant pain: Secondary | ICD-10-CM

## 2022-11-05 DIAGNOSIS — Z9221 Personal history of antineoplastic chemotherapy: Secondary | ICD-10-CM | POA: Diagnosis not present

## 2022-11-05 DIAGNOSIS — Z9013 Acquired absence of bilateral breasts and nipples: Secondary | ICD-10-CM | POA: Diagnosis not present

## 2022-11-05 DIAGNOSIS — Z90710 Acquired absence of both cervix and uterus: Secondary | ICD-10-CM | POA: Insufficient documentation

## 2022-11-05 DIAGNOSIS — Z90722 Acquired absence of ovaries, bilateral: Secondary | ICD-10-CM | POA: Insufficient documentation

## 2022-11-05 DIAGNOSIS — Z923 Personal history of irradiation: Secondary | ICD-10-CM | POA: Diagnosis not present

## 2022-11-05 LAB — LAB REPORT - SCANNED: EGFR: 33

## 2022-11-05 NOTE — Patient Instructions (Addendum)
It was good to see you today.  I do not see or feel any evidence of cancer recurrence on your exam.  Please keep a food diary as well as diary of what you are drinking, abdominal symptoms, and bowel function.  I think this will be very helpful to identify foods that may be contributing to your symptoms.  I will see you for follow-up in 6 months.  As always, if you develop any new and concerning symptoms before your next visit, please call to see me sooner.

## 2022-11-05 NOTE — Progress Notes (Unsigned)
Gynecologic Oncology Return Clinic Visit  11/05/22  Reason for Visit: Surveillance in the setting of a complex history of both endometrial and ovarian cancer   Treatment History: Oncology History Overview Note  Breast cancer of upper-inner quadrant of left female breast [facility])   Staging form: Breast, AJCC 7th Edition     Clinical stage from 12/31/2015: Stage IIA (T2, N0, M0) - Signed by Artis Delay, MD on 12/31/2015  ER,PR positive Her2 neg   History of ovarian & endometrial cancer  10/15/2004 Initial Diagnosis   History of ovarian cancer, treated with chemotherapy carbo/Taxol   03/17/2009 Bone Marrow Biopsy   B[facility] marrow biopsy at [facility] showed neutropenia   01/23/2014 Bone Marrow Biopsy   Repeat bone marrow biopsy showed neutropenia   09/12/2014 Imaging   CT scan showed possible cancer   10/14/2014 Imaging   MRI show significant pelvic mass without invasion into the bladder but abutting to the rectum   11/19/2014 Surgery   she underwent surgery and had robotic-assisted lysis of adhesions, converted to laparotomy, radical upper vaginectomy and low anterior resection with colostomy. 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 Resection margins were negative   01/29/2015 - 03/10/2015 Radiation Therapy   She received adjuvant radiation   03/03/2015 Imaging   CT scan of the abdomen and pelvis showed status post interval removal of the bilateral nephroureteral stents. Worsening moderate right hydroureteronephrosis. Resolved left hydroureteronephrosis.   03/20/2015 Procedure   she had cystoscopy and stent placement for right hydronephrosis   05/02/2015 Surgery   Cystoscopy with right retrograde pyelogram interpretation. Diagnostic ureteroscopy. Removal of right ureteral stent.   05/14/2015  Imaging   CT scan of the abdomen and pelvis show unilateral right hydronephrosis with no residual cancer   08/18/2015 Imaging   CT scan showed hysterectomy with stable presacral soft tissue thickening. No definitive evidence of recurrent or metastatic disease. Severe right hydronephrosis   09/12/2015 Surgery   Cystoscopy with right retrograde pyelogram interpretation. Right ureteral stent placement 5 x 24 Polaris, no tether   10/07/2016 Imaging   Stable exam.  No new or progressive findings. 2. Stable appearance abnormal presacral soft tissue compatible with post treatment change. 3. Internal right ureteral stent with persistent right hydroureteronephrosis. Differentially decreased perfusion of the right kidney suggests a component of underlying obstructive uropathy. 4. Stable 13 mm left adrenal nodule, previously characterized as adenoma.       01/27/2017 Imaging   Stable postop and post radiation changes in presacral region. No evidence of recurrent or metastatic carcinoma within the chest, abdomen, or pelvis. Stable mild to moderate right hydroureteronephrosis, with right ureteral stent in appropriate position. Stable small benign left adrenal adenoma and left thyroid lobe nodule.   10/02/2019 Tumor Marker   Patient's tumor was tested for the following markers: CA-125 Results of the tumor marker test revealed 5.8   Breast cancer of upper-inner quadrant of left female breast  10/14/2015 Imaging   Screening mammogram showed possible distortion in the left breast.   10/24/2015 Imaging   Targeted ultrasound is performed, showing a 0.6 x 0.8 x 0.9 cm area of hypoechoic distortion at the 10 o'clock position of the left breast 5 cm from the nipple   10/24/2015 Imaging   Diagnostic imaging confirmed 0.6 x 0.8 x 0.9 cm distortion in the upper inner left breast   10/30/2015 Procedure  Left US guided biopsy was performed   10/30/2015 Pathology Results   Accession: [id number] showed invasive ductal  carcinoma, ER/PR positive, Her2 neg   11/05/2015 Genetic Testing   Genetic testing did not reveal a deleterious mutation.  Genetic testing did detect a Variant of Unknown Significance in the MSH6 gene called c.389A>G..  Genes tested include: APC, ATM, AXIN2, BARD1, BMPR1A, BRCA1, BRCA2, BRIP1, CDH1, CDKN2A, CHEK2, DICER1, EPCAM, GREM1, KIT, MEN1, MLH1, MSH2, MSH6, MUTYH, NBN, NF1, PALB2, PDGFRA, PMS2, POLD1, POLE, PTEN, RAD50, RAD51C, RAD51D, SDHA, SDHB, SDHC, SDHD, SMAD4, SMARCA4. STK11, TP53, TSC1, TSC2, and VHL.  UPDATE: MSH6 c.389A>G (p.His130Arg) has been reclassified as Likely Benign.  The amended report date is February 25, 2021.   12/11/2015 Surgery   She undwerwent bilateral mastectomy, left SLN biopsy and immediate reconstruction surgeries with bilateral plasma expanders   12/11/2015 Pathology Results   Accession: [id number] mastectomy specimens showed left breast ca, pT2N0M0   12/26/2015 Pathology Results   Accession: [id number] specimen from debridement showed no cancer   12/26/2015 Surgery   she underwent surgical debridement of necrotic tissue at site of plasma expander   01/12/2016 Imaging   Bone density is normal   02/02/2016 -  Anti-estrogen oral therapy   She started taking Arimidex   10/21/2017 Procedure   Ultrasound guided biopsy of a vague hypoechoic shadowing lesion, generally corresponding to area palpable abnormality, along chest lateral to the reconstructed left breast and inferior to the left axilla. No apparent complications.   01/17/2018 Imaging   Bone density scan is normal    She has a history of endometrial and ovarian endometrioid carcinoma treated in 2006 by Dr. [name] at [facility] in [place]. Her surgery (TAH, BSO) was followed by adjuvant chemotherapy with carboplatin plus paclitaxel due to the ovarian involvement and the presence of a cul de sac lesion also positive for disease. She denies receiving adjuvant radiation therapy. She is  unclear if she had metastatic endometrial cancer to the ovary or duel primaries. She had a complete response to therapy however developed leukopenia in July 2010. 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 She sees Dr. Alvy Bimler for this and is treated with G-CSF injections.   She began experiencing rectal pain approximately in January 2016. She also reports narrowing of caliber of the stool. She denies hematochezia. She does report approximately 3 months of vaginal spotting. She's had no specific follow-up for her gynecologic cancers in the past 4 years.   As part of workup of her rectal pain she underwent a CT scan of the abdomen and pelvis on 09/12/2014. This demonstrated a new right perirectal mass abutting the vaginal cuff measuring 3.8 x 4.9 cm. There is a limited fat plane between the mass and the rectum posteriorly. Rectal invasion could not be excluded. There were no other masses identified in the abdomen and pelvis or lymphadenopathy. There is no other evidence of metastatic disease or recurrent disease. There was no hydronephrosis. A CA-125 drawn on 09/12/2014 was normal at 10.   PET was negative for extrapelvic disease. MRI defined the lesion as a 3.5x5cm lesion to the right of the rectum at the vaginal cuff.   Colonoscopy was performed on 10/02/14 with transrectal [place] and biopsy and this revealed endometrioid adenocarcinoma. Of note, the lesion was not seen within the lumen of the rectum.    She then underwent a posterior supralevator exenteration with colostomy and bladder  repair and stent placement on 1/82/09 without complications with Dr Denman [name] and Dr Johney Maine.  Her postoperative course was uncomplicate with the exception of development of postop anemia.  Her final pathology revealed endometrioid adenocarcinoma invading the vagina and rectum with negative margins on the specimen. The  margin had been close (clinically) to the right pelvic sidewall which was marked with surgical clips.   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 It was treated with IV antibiotics and then on POD 15 she was taken to the OR for laparoscopic revision of the stoma with Dr Michael [name]. Postoperatively she had wound vac and packing for her stomal wound.   A retrograde cystogram on week 4 postop confirmed an intact bladder and the foley was removed.   She initially had some voiding issues with decreased sensation to void. We tested a post void residual in May, 2016 and this revealed adequate voiding.   On 12/30/14 she was admitted to [facility] with sepsis associated with Enterobacter Cloacae UTI. This was treated with IV antibiotics and then a prolonged course of oral cipro. Imaging performed at the time of admission (a CT of the abdo/pelvis) revealed: Bilateral double-J internal ureteral stents in adequate position with persistent mild bilateral hydronephrosis    She complete radiation therapy from 01/29/15 to 03/10/15 with 50Gy of external beam radiation and IMRT. She tolerated therapy well with minor skin irritation.   She had her ureteral stents removed in June, 2016, however, then developed right ureteral obstruction, hydroureter and pain. She went to the OR with Urologist Dr Tresa Moore on 03/20/15 for ureteral dilation and right stent placement (the left was draining well). No tumor was seen on cysto.    CT abdo/pelvis on 05/14/15 showed: no new lesions, stable thickening in right pelvis/distal right ureter consistent with radiation effect. The right ureteral stent was removed.   The patient's CT in January 2017 showed progression of her right ureteral obstruction after stent removal. Dr Tresa Moore took her to the OR on 09/05/15 for a cystoscopy, retrograde pyelogram and right ureteral stent placement.    The CT on 08/18/15 showed  decreased attenuation of the right renal parenchyma, right hydronephrosis that was severe no excretion of contrast on that for graphic phase imaging. The perivascular soft tissue thickening in the pelvis and presacral regions grossly stable consistent with radiation changes.   The patient was diagnosed with ER/PR positive left breast cancer in April 2017. Surgery is scheduled for May. She underwent bilateral mastectomy with expander reconstruction. Postoperatively she was treated with Arimidex.    Repeat CT imaging on 02/08/16 revealed : Stable post treatment changes in presacral region. No evidence of recurrent or metastatic carcinoma within the abdomen or pelvis. Interval placement of right ureteral stent in appropriate position. Moderate right hydroureteronephrosis remains stable. Stable benign left adrenal adenoma and hepatic steatosis.   On 06/30/16 she had replacement of her right ureteral stent with Dr Tresa Moore.   On 10/07/16 CT surveillance was performed which showed: Stable exam.  No new or progressive findings. Stable appearance abnormal presacral soft tissue compatible with post treatment change. Internal right ureteral stent with persistent right hydroureteronephrosis. Differentially decreased perfusion of the right kidney suggests a component of underlying obstructive uropathy. Stable 13 mm left adrenal nodule, previously characterized as adenoma. A parastomal hernia was noted.   CT 01/27/17 showed stable findings with no evidence of recurrence. Persistent right hydroureter was again noted.  Her colostomy bothered her greatly and she hoped to be free of stomal appliances. She was frustrated with repetitive stent replacements and desired definitive reconstruction/reimplantation of the right ureter.     Given that a 2 year disease free interval had elapsed, in 04/01/17 Dr Tresa Moore and Dr Johney Maine returned to the operating room with Ms Vinton for a right ureteral resection, and bladder hitch with  reimplantation and reversal of hartman's with temporary ileostomy complicated by postop development of urinoma which required a peritoneal drain and right ureteral stent.   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 She was offered permanent diversion with ileal conduit and permanent end colostomy. This was not considered a reasonable option at that time by [facility]. She became quite depressed by the slow recovery and sequelae of her last surgery. She sought second opinion with Gynecologic Oncology at [facility]. They offered referral to urology and colorectal specialists at [facility]. They recommended no additional cancer treatment as she was disease free. She is being worked up by them for possible laparotomy, revision of right uretero-neocystotomy, placement of VRAM flap, and possible bowel resection and revision of ileostomy. Prior to embarking on this surgery they requested she lose weight to a BMI of <30kg/m2.   She had lost weight to a BMI <30 throughout 2020. She experienced some episodes of passage of bloody mucous from the vagina in May, 2020 and so an CT scan was performed which showed no signs of pelvic recurrence.    In September the patient embarked on a large procedure at [employer].  She underwent an exploratory laparotomy panniculectomy cystectomy creation of urinary conduit with ileum, proctectomy, and formation of permanent and colostomy.  The procedure was uncomplicated.  She reported that while plastic surgery had planned to take part in the procedure with placement of a flap, it was determined that this was not necessary intraoperatively.  While the procedure note in Care Everywhere states that a hysterectomy was performed, this had already been performed more than a decade prior.  Additionally there was no myocutaneous flap placed despite the procedure  reporting as such.  No malignancy was identified in the specimens.   The patient had an uncomplicated procedure overall and a 7-day hospital stay with subsequent rehab.  She developed some wound healing issues at her colostomy site.   She is struggled with vaginal drainage and bleeding since.   She was seen in our clinic by Dr. Denman [name] in June 2021.  In the setting of continued vaginal bleeding and drainage, she sought consultation at [facility] with Dr. Fransisca Connors.  On exam, vagina noted to be atrophic.  Blood was seen from the apex of the vaginal vault and on bimanual exam the vault and apex extended posteriorly towards the sacrum without an obvious defect.  Given some concern for possibility of osteomyelitis this driving this drainage, MRI was performed.  MRI on 09/27/2020 showed a 2.3 x 1.9 x 3.3 cm fluid collection that appears contiguous with the rectal pouch via thickened fluid-filled tract and questionable thin fistulous tract anteriorly to the vaginal cuff.  Marked presacral soft tissue thickening and stranding likely reflecting posttreatment changes noted.  No definite evidence of local recurrence.   08/19/21: Excision of 1 cm left upper vulva/groin mass, Examiner anesthesia, vulvar biopsies.  Pathology revealed benign epidermal inclusion cyst.  Vaginal biopsy showed ulcerated squamous mucosa with lymphoplasmacytic infiltrate, no dysplasia or malignancy.  08/25/21: CT of the abdomen and pelvis showed no acute findings.  No evidence of recurrent or metastatic disease.  Stable small benign left adrenal adenoma.  Interval History: Patient reports recent episode where she had sharp pain in her abdomen and pelvis with multiple days of no bowel function.  This happened the day after she increased her Mounjaro dose.  She skipped her Greggory Keen this past weekend and has had slow improvement of her symptoms.  She continues to endorse intermittent sharp pain when food seems to hit her bowel in her right pelvis, with  intermittent radiation towards her liver and kidney.  She saw her kidney doctor yesterday and had labs performed.  She tells me that her creatinine is down from its last value.  She denies any emesis.  She is working on increasing p.o. intake.  She has had some improvement in her bowel function.  She notes recent increase in vaginal discharge, with odor.  Occasional bleeding.  Past Medical/Surgical History: Past Medical History:  Diagnosis Date   Anemia in chronic kidney disease    Anticoagulant long-term use    eliquis--- managed by cardiology   Benign essential HTN    Breast cancer, left 10/2015   oncologist--- dr Bertis Ruddy--- 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+, HER2 negative/  12-11-2015 bilateral mastecotmy w/ reconstruction (no radiation and no chemo)   Cancer of corpus uteri, except isthmus 09/2004   oncologist-- dr rossi/ dr gorsuch:/   dx endometroid endometrial & ovarian cancer s/p  chemotheapy and surgery(TAH w/ BSO) :  recurrent 11-19-2014 post pelvic surgery and radiation 01-29-2015 to 03-10-2015   Chronic idiopathic neutropenia    presumed related to chemotherapy March 2006--- followed by dr Bertis Ruddy (treatment w/ G-CSF injections   Chronic nausea    CKD (chronic kidney disease), stage III    nephrologist-  dr Bufford Buttner   Difficult intravenous access    small veins--- hx PICC lines   DM type 2 (diabetes mellitus, type 2)    endocriologist--  dr Joselyn Glassman Janee Morn   (08-18-2021  per pt checks blood sugar daily in am,  fasting sugar--92--130)   Dysrhythmia    Environmental and seasonal allergies    Generalized muscle weakness    GERD (gastroesophageal reflux disease)    Hiatal hernia    History of abdominal abscess 04/16/2017   post surgery 04-01-2017  --- resolved 10/ 2018   History of COVID-19 05/2021   per pt mild symptoms that resolved   History of gastric polyp    2014  duodenum   History of ileus 04/16/2017   resolved w/ no surgical  intervention   History of radiation therapy    01-29-2015 to 03-10-2015  pelvis 50.4Gy   History of small bowel obstruction 01/2019   Hypothyroidism    monitored by dr Casimiro Needle altheimer   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 Gerrit Friends   PAF (paroxysmal atrial fibrillation)    cardiologist--- dr h. Katrinka Blazing;  event monitor 05/ 2021 in epic, NSR with 1% burden Afib, first degree heart blockl and PACs;  echo 04/ 2021 ef 55-60%, mild LVH   Peripheral neuropathy    PONV (postoperative nausea and vomiting)    "scopolamine patch works for me"   Presence of urostomy 04/2019   Radiation-induced dermatitis    contact dermatitis , radiation completed, rash only on  ankles now.   Vitamin D deficiency    Vulval lesion    Wears glasses     Past Surgical History:  Procedure Laterality Date   APPENDECTOMY     BREAST RECONSTRUCTION WITH PLACEMENT OF TISSUE EXPANDER AND FLEX HD (ACELLULAR HYDRATED DERMIS) Bilateral 12/11/2015   Procedure: BILATERAL BREAST RECONSTRUCTION WITH PLACEMENT OF TISSUE EXPANDERS;  Surgeon: Glenna Fellows, MD;  Location: [facility];  Service: Plastics;  Laterality: Bilateral;   COLONOSCOPY WITH PROPOFOL N/A 08/21/2013   Procedure: COLONOSCOPY WITH PROPOFOL;  Surgeon: Florencia Reasons, MD;  Location: [facility];  Service: Endoscopy;  Laterality: N/A;   COLOSTOMY TAKEDOWN N/A 12/04/2014   Procedure: LAPROSCOPIC LYSIS OF ADHESIONS, SPLENIC MOBILIZATION, RELOCATION OF COLOSTOMY, DEBRIDEMENT INITIAL COLOSTOMY SITE;  Surgeon: Karie Soda, MD;  Location: [facility];  Service: General;  Laterality: N/A;   CYSTECTOMY W/ URETEROILEAL CONDUIT  04/12/2019   @[facility] ;   INTESTINAL ANASTOMOSIS, PANNICULECTOMY, PELVIS EXENTENTION,  PARTIAL COLECTOMY REMOVAL ILEIUM WITH PERMNANT ILEOCOLOSTOMY   CYSTOGRAM N/A 06/01/2017   Procedure: CYSTOGRAM;  Surgeon: Sebastian Ache, MD;   Location: [facility];  Service: Urology;  Laterality: N/A;   CYSTOSCOPY W/ RETROGRADES Right 11/21/2015   Procedure: CYSTOSCOPY WITH RETROGRADE PYELOGRAM;  Surgeon: Sebastian Ache, MD;  Location: [facility];  Service: Urology;  Laterality: Right;   CYSTOSCOPY W/ URETERAL STENT PLACEMENT Right 11/21/2015   Procedure: CYSTOSCOPY WITH STENT REPLACEMENT;  Surgeon: Sebastian Ache, MD;  Location: [facility];  Service: Urology;  Laterality: Right;   CYSTOSCOPY W/ URETERAL STENT PLACEMENT Right 03/10/2016   Procedure: CYSTOSCOPY WITH STENT REPLACEMENT;  Surgeon: Sebastian Ache, MD;  Location[facility];  Service: Urology;  Laterality: Right;   CYSTOSCOPY W/ URETERAL STENT PLACEMENT Right 06/30/2016   Procedure: CYSTOSCOPY WITH RETROGRADE PYELOGRAM/URETERAL STENT EXCHANGE;  Surgeon: Sebastian Ache, MD;  Location: [facility];  Service: Urology;  Laterality: Right;   CYSTOSCOPY W/ URETERAL STENT PLACEMENT N/A 06/01/2017   Procedure: CYSTOSCOPY WITH EXAM UNDER ANESTHESIA;  Surgeon: Sebastian Ache, MD;  Location: [facility];  Service: Urology;  Laterality: N/A;   CYSTOSCOPY W/ URETERAL STENT PLACEMENT Right 08/17/2017   Procedure: CYSTOSCOPY WITH RETROGRADE PYELOGRAM/URETERAL STENT REMOVAL;  Surgeon: Sebastian Ache, MD;  Location: [facility];  Service: Urology;  Laterality: Right;   CYSTOSCOPY WITH RETROGRADE PYELOGRAM, URETEROSCOPY AND STENT PLACEMENT Right 03/20/2015   Procedure: CYSTOSCOPY WITH RETROGRADE PYELOGRAM, URETEROSCOPY WITH BALLOON DILATION AND STENT PLACEMENT ON RIGHT;  Surgeon: Sebastian Ache, MD;  Location: [facility];  Service: Urology;  Laterality: Right;   CYSTOSCOPY WITH RETROGRADE PYELOGRAM, URETEROSCOPY AND STENT PLACEMENT Right 05/02/2015   Procedure: CYSTOSCOPY WITH RIGHT RETROGRADE PYELOGRAM,  DIAGNOSTIC URETEROSCOPY AND STENT PULL ;  Surgeon: Sebastian Ache, MD;  Location: [facility];  Service: Urology;  Laterality:  Right;   CYSTOSCOPY WITH RETROGRADE PYELOGRAM, URETEROSCOPY AND STENT PLACEMENT Right 09/05/2015   Procedure: CYSTOSCOPY WITH RETROGRADE PYELOGRAM,  AND STENT PLACEMENT;  Surgeon: Sebastian Ache, MD;  Location: [facility];  Service: Urology;  Laterality: Right;   CYSTOSCOPY WITH RETROGRADE PYELOGRAM, URETEROSCOPY AND STENT PLACEMENT Right 04/01/2017   Procedure: CYSTOSCOPY WITH RETROGRADE PYELOGRAM, URETEROSCOPY AND STENT PLACEMENT;  Surgeon: Sebastian Ache, MD;  Location: [facility];  Service: Urology;  Laterality: Right;   CYSTOSCOPY WITH STENT PLACEMENT Right 10/27/2016   Procedure: CYSTOSCOPY WITH STENT CHANGE and right retrograde pyelogram;  Surgeon: Sebastian Ache, MD;  Location: [facility];  Service: Urology;  Laterality: Right;   EUS N/A 10/02/2014   Procedure: LOWER ENDOSCOPIC  ULTRASOUND (EUS);  Surgeon: Willis Modena, MD;  Location: [facility];  Service: Endoscopy;  Laterality: N/A;   EXCISION SOFT TISSUE MASS RIGHT FOREMAN  12/08/2006   EYE SURGERY  as child   pytosis of eyelids repair   INCISION AND DRAINAGE OF WOUND Bilateral 12/26/2015   Procedure: DEBRIDEMENT OF BILATERAL MASTECTOMY FLAPS;  Surgeon: Glenna Fellows, MD;  Location: [facility];  Service: Plastics;  Laterality: Bilateral;   IR CV LINE INJECTION  05/31/2017   IR FLUORO GUIDE CV LINE LEFT  05/31/2017   IR FLUORO GUIDE CV LINE RIGHT  04/06/2017   IR FLUORO GUIDE CV MIDLINE PICC RIGHT  05/30/2017   IR NEPHROSTOGRAM LEFT INITIAL PLACEMENT  09/02/2017   IR NEPHROSTOGRAM LEFT THRU EXISTING ACCESS  11/29/2017   IR NEPHROSTOGRAM RIGHT INITIAL PLACEMENT  09/02/2017   IR NEPHROSTOGRAM RIGHT THRU EXISTING ACCESS  09/13/2017   IR NEPHROSTOGRAM RIGHT THRU EXISTING ACCESS  11/29/2017   IR NEPHROSTOMY EXCHANGE LEFT  11/28/2017   IR NEPHROSTOMY EXCHANGE LEFT  01/05/2018   IR NEPHROSTOMY EXCHANGE LEFT  02/16/2018   IR NEPHROSTOMY EXCHANGE LEFT  03/30/2018   IR NEPHROSTOMY EXCHANGE LEFT  05/12/2018    IR NEPHROSTOMY EXCHANGE LEFT  06/21/2018   IR NEPHROSTOMY EXCHANGE LEFT  08/04/2018   IR NEPHROSTOMY EXCHANGE LEFT  09/18/2018   IR NEPHROSTOMY EXCHANGE LEFT  10/09/2018   IR NEPHROSTOMY EXCHANGE LEFT  10/27/2018   IR NEPHROSTOMY EXCHANGE LEFT  11/21/2018   IR NEPHROSTOMY EXCHANGE LEFT  01/05/2019   IR NEPHROSTOMY EXCHANGE LEFT  02/15/2019   IR NEPHROSTOMY EXCHANGE LEFT  03/29/2019   IR NEPHROSTOMY EXCHANGE RIGHT  10/02/2017   IR NEPHROSTOMY EXCHANGE RIGHT  11/28/2017   IR NEPHROSTOMY EXCHANGE RIGHT  01/05/2018   IR NEPHROSTOMY EXCHANGE RIGHT  02/16/2018   IR NEPHROSTOMY EXCHANGE RIGHT  03/30/2018   IR NEPHROSTOMY EXCHANGE RIGHT  05/12/2018   IR NEPHROSTOMY EXCHANGE RIGHT  06/21/2018   IR NEPHROSTOMY EXCHANGE RIGHT  08/04/2018   IR NEPHROSTOMY EXCHANGE RIGHT  09/18/2018   IR NEPHROSTOMY EXCHANGE RIGHT  10/27/2018   IR NEPHROSTOMY EXCHANGE RIGHT  11/21/2018   IR NEPHROSTOMY EXCHANGE RIGHT  01/05/2019   IR NEPHROSTOMY EXCHANGE RIGHT  02/15/2019   IR NEPHROSTOMY EXCHANGE RIGHT  03/29/2019   IR NEPHROSTOMY PLACEMENT LEFT  10/02/2017   IR RADIOLOGIST EVAL & MGMT  05/03/2017   IR US GUIDE VASC ACCESS LEFT  05/31/2017   IR US GUIDE VASC ACCESS RIGHT  04/06/2017   IR US GUIDE VASC ACCESS RIGHT  05/30/2017   LAPAROSCOPIC CHOLECYSTECTOMY  1990   LIPOSUCTION WITH LIPOFILLING Bilateral 04/16/2016   Procedure: LIPOSUCTION WITH LIPOFILLING TO BILATERAL CHEST;  Surgeon: Glenna Fellows, MD;  Location: [facility];  Service: Plastics;  Laterality: Bilateral;   MASTECTOMY W/ SENTINEL NODE BIOPSY Bilateral 12/11/2015   Procedure: RIGHT PROPHYLACTIC MASTECTOMY, LEFT TOTAL MASTECTOMY WITH LEFT AXILLARY SENTINEL LYMPH NODE BIOPSY;  Surgeon: Almond Lint, MD;  Location: [facility];  Service: General;  Laterality: Bilateral;   OSTOMY N/A 11/19/2014   Procedure: OSTOMY;  Surgeon: Karie Soda, MD;  Location: [facility];  Service: General;  Laterality: N/A;   PROCTOSCOPY N/A 04/01/2017   Procedure:  RIDGE PROCTOSCOPY;  Surgeon: Karie Soda, MD;  Location: [facility];  Service: General;  Laterality: N/A;   REMOVAL OF BILATERAL TISSUE EXPANDERS WITH PLACEMENT OF BILATERAL BREAST IMPLANTS Bilateral 04/16/2016   Procedure: REMOVAL OF BILATERAL TISSUE EXPANDERS WITH PLACEMENT OF BILATERAL BREAST IMPLANTS;  Surgeon: Glenna Fellows, MD;  Location: [facility];  Service: Plastics;  Laterality: Bilateral;   ROBOTIC ASSISTED LAP VAGINAL HYSTERECTOMY N/A 11/19/2014   Procedure: ROBOTIC LYSIS OF ADHESIONS, CONVERTED TO LAPAROTOMY RADICAL UPPER VAGINECTOMY,LOW ANTERIOR BOWEL RESECTION, COLOSTOMY, BILATERAL URETERAL STENT PLACEMENT AND CYSTONOMY CLOSURE;  Surgeon: Adolphus Birchwood, MD;  Location: [facility];  Service: Gynecology;  Laterality: N/A;   TISSUE EXPANDER FILLING Bilateral 12/26/2015   Procedure: EXPANSION OF BILATERAL CHEST TISSUE EXPANDERS (60 mL- Right; 75 mL- Left);  Surgeon: Glenna Fellows, MD;  Location: [facility];  Service: Plastics;  Laterality: Bilateral;   TONSILLECTOMY     TOTAL ABDOMINAL HYSTERECTOMY  March 2006   [facility]   and Bilateral Salpingoophorectomy/  staging for Ovarian cancer/  an   VULVAR LESION REMOVAL N/A 08/19/2021   Procedure: VULVAR LESION with biopsies;  Surgeon: Carver Fila, MD;  Location: [facility];  Service: Gynecology;  Laterality: N/A;   XI ROBOTIC ASSISTED LOWER ANTERIOR RESECTION N/A 04/01/2017   Procedure: XI ROBOTIC VS LAPAROSCOPIC COLOSTOMY TAKEDOWN WITH LYSIS OF ADHESIONS.;  Surgeon: Karie Soda, MD;  Location: [facility];  Service: General;  Laterality: N/A;  ERAS PATHWAY    Family History  Problem Relation Age of Onset   Cancer Mother 52       stomach ca   Hypertension Mother    Cancer Father 43       prostate ca   Diabetes Father    Heart disease Father        CABG   Hypertension Father    Hyperlipidemia Father    Obesity Father    Breast cancer Maternal Aunt        dx in her 89s   Lymphoma Paternal  Aunt    Brain cancer Paternal Grandfather    Ovarian cancer Other    Diabetes Sister    Hypertension Brother y-10   Heart disease Brother        CABG   Diabetes Brother     Social History   Socioeconomic History   Marital status: Married    Spouse name: Clinical research associate   Number of children: 1   Years of education: Not on file   Highest education level: Not on file  Occupational History   Occupation: retired Charity fundraiser from [employer]    Comment: L&D Charity fundraiser - retired  Tobacco Use   Smoking status: Never   Smokeless tobacco: Never  Vaping Use   Vaping Use: Never used  Substance and Sexual Activity   Alcohol use: Yes    Comment: Occ   Drug use: No   Sexual activity: Not Currently  Other Topics Concern   Not on file  Social History Narrative   Exercise-- has not gotten back into it since cancer came back   Social Determinants of Health   Financial Resource Strain: Not on file  Food Insecurity: Not on file  Transportation Needs: Not on file  Physical Activity: Not on file  Stress: Not on file  Social Connections: Not on file    Current Medications:  Current Outpatient Medications:    aspirin EC 81 MG tablet, Take 1 tablet (81 mg total) by mouth daily. Swallow whole., Disp: 90 tablet, Rfl: 3   Biotin 5 MG TABS, Take 5 mg by mouth daily. , Disp: , Rfl:    buPROPion (WELLBUTRIN XL) 150 MG 24 hr tablet, Take 1 tablet (150 mg total) by mouth daily., Disp: 90 tablet, Rfl: 0   calcium citrate-vitamin D (CITRACAL+D) 315-200 MG-UNIT tablet, Take 1 tablet  by mouth daily., Disp: , Rfl:    cefpodoxime (VANTIN) 200 MG tablet, Take 200 mg by mouth 2 (two) times daily., Disp: , Rfl:    Cholecalciferol (VITAMIN D3) 10000 UNITS capsule, Take 10,000 Units by mouth See admin instructions. Take 1 tablet by mouth 43times per week., Disp: , Rfl:    clobetasol (OLUX) 0.05 % topical foam, Apply topically 2 (two) times daily., Disp: 50 g, Rfl: 3   Continuous Blood Gluc Sensor (FREESTYLE LIBRE 3 SENSOR) MISC,  Inject 1 sensor to the skin every 14 days for continuous glucose monitoring., Disp: , Rfl:    Cranberry-Vitamin C 15000-100 MG CAPS, Take by mouth daily. 1 in Morning and 1 in PM, Disp: , Rfl:    doxycycline (VIBRAMYCIN) 100 MG capsule, Take 100 mg by mouth 2 (two) times daily., Disp: , Rfl:    famotidine-calcium carbonate-magnesium hydroxide (PEPCID COMPLETE) 10-800-165 MG chewable tablet, Chew 1 tablet by mouth as needed., Disp: , Rfl:    ferrous sulfate 325 (65 FE) MG tablet, Take 1 tablet (325 mg total) by mouth at bedtime., Disp: 30 tablet, Rfl: 3   fluticasone (FLONASE) 50 MCG/ACT nasal spray, Place 2 sprays into both nostrils daily., Disp: 16 g, Rfl: 6   HYDROcodone bit-homatropine (HYCODAN) 5-1.5 MG/5ML syrup, Take 5 mLs by mouth every 6 (six) hours as needed for cough., Disp: 120 mL, Rfl: 0   levothyroxine (SYNTHROID, LEVOTHROID) 150 MCG tablet, Take 1 tablet (150 mcg total) by mouth daily before breakfast. (Patient taking differently: Take 150 mcg by mouth daily before breakfast. Twice a day Wednesday and Sunday), Disp: 30 tablet, Rfl: 1   loratadine (CLARITIN) 10 MG tablet, Take 10 mg by mouth daily. , Disp: , Rfl:    omega-3 acid ethyl esters (LOVAZA) 1 G capsule, Take 1 g by mouth 2 (two) times daily. , Disp: , Rfl:    Polyethyl Glycol-Propyl Glycol (SYSTANE OP), Place 1 drop into both eyes daily as needed (dry eyes). , Disp: , Rfl:    pregabalin (LYRICA) 50 MG capsule, TAKE 1 CAPSULE TWICE A DAY, Disp: 60 capsule, Rfl: 5   Prenatal Vit-Fe Fumarate-FA (PRENATAL VITAMIN PO), Take 1 capsule by mouth daily. Takes prenatal because there are no dyes in it, Disp: , Rfl:    Probiotic Product (ALIGN PO), Take 1 capsule by mouth daily., Disp: , Rfl:    rosuvastatin (CRESTOR) 10 MG tablet, Take 10 mg by mouth every evening., Disp: , Rfl:    Saccharomyces boulardii (FLORASTOR PO), Take 1 capsule by mouth at bedtime., Disp: , Rfl:    Tazarotene (ARAZLO) 0.045 % LOTN, Apply 1 application topically  daily., Disp: 45 g, Rfl: 0   Tbo-Filgrastim (GRANIX) 480 MCG/0.8ML SOSY injection, 480 mcg subcutaneous injection every 7 days life long480 mcg subcutaneous injection every 7 days life long, Disp: 11.2 mL, Rfl: 11   tirzepatide (MOUNJARO) 12.5 MG/0.5ML Pen, Inject 12.5 mg into the skin once a week. Saturdays, Disp: , Rfl:    tiZANidine (ZANAFLEX) 4 MG tablet, Take 0.5-1 tablets (2-4 mg total) by mouth every 8 (eight) hours as needed for muscle spasms., Disp: 180 tablet, Rfl: 3   XIFAXAN 550 MG TABS tablet, Take 550 mg by mouth as needed., Disp: , Rfl:   Review of Systems: + Decreased appetite, chills, bloating, abdominal pain, blood in urine, pelvic pain, vaginal bleeding, vaginal discharge, back pain. Denies fevers, fatigue, unexplained weight changes. Denies hearing loss, neck lumps or masses, mouth sores, ringing in ears or voice changes. Denies cough  or wheezing.  Denies shortness of breath. Denies chest pain or palpitations. Denies leg swelling. Denies abdominal blood in stools, constipation, diarrhea, nausea, vomiting, or early satiety. Denies pain with intercourse, dysuria, frequency, or incontinence. Denies hot flashes.   Denies joint pain,  or muscle pain/cramps. Denies itching, rash, or wounds. Denies dizziness, headaches, numbness or seizures. Denies swollen lymph nodes or glands, denies easy bruising or bleeding. Denies anxiety, depression, confusion, or decreased concentration.  Physical Exam: BP (!) 121/54 (BP Location: Left Arm, Patient Position: Sitting)   Pulse 67   Temp 98.2 F (36.8 C) (Oral)   Resp 16   Ht 5\' 1"  (1.549 m)   Wt 209 lb (94.8 kg)   SpO2 100%   BMI 39.49 kg/m  General: Alert, oriented, no acute distress. HEENT: Normocephalic, atraumatic, sclera anicteric. Chest: Clear to auscultation bilaterally.  No wheezes or rhonchi. Cardiovascular: Regular rate and rhythm, no murmurs. Abdomen: Obese, soft, nontender.  Normoactive bowel sounds.  Ostomy bag in  each lower quadrant, urine in the urostomy bag.  Around the left lower quadrant bag, at the inferior and right aspect, there are some hyperpigmented changes to the skin.   Extremities: Grossly normal range of motion.  Warm, well perfused.  No edema bilaterally. Lymphatics: No cervical, supraclavicular, or inguinal adenopathy. GU: Normal appearing external female genitalia.  On speculum exam with a small speculum, vaginal mucosa is normal in appearance.  There is a ring of constricted tissue where the vagina narrows approximately 4 cm above the introitus, vaginal mucosa is healthy appearing.  Minimal discharge noted.  On bimanual exam, no masses or nodularity appreciated.  Narrowing of the vagina is palpated with cavity above this narrowing.  No nodularity on rectovaginal exam.  Laboratory & Radiologic Studies: CT 11/03/22: IMPRESSION: 1. No acute intra-abdominal or pelvic pathology. No evidence of recurrent or metastatic disease. 2. Additional findings as above and similar to prior CT.  Assessment & Plan: [name] is a 67 y.o. woman with a history of recurrent endometrioid endometrial/ovarian cancer.   S/p exploratory laparotomy, posterior supralevator pelvic exenteration, end colostomy, bladder repair, ureteral stenting with complete resection and negative margins on 11/09/14. S/p adjuvant radiation completed 03/10/15. Disease free since this time.   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   Status post pelvic exenteration procedure at [facility] in September 2020 with proctectomy and cystectomy and formation of permanent colostomy and ileal conduit.  Vaginectomy not performed.   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 No specific follow-up or  surveillance recommended by genetics counselor.   Plan: 1) Right lower quadrant pain and intermittent pSBO symptoms: I suspect symptoms are related to prior treatment (surgical adhesions). We discussed keeping a journal of food, medications, bowel function, and pain to help identify foods/medication changes that precipitate symptoms. CT does not show obvious reason for her pain but we reviewed again today that adhesions frequently at not visible on imaging. Any surgical intervention would be VERY morbid (the patient knows that I do not recommend this).  2) History of recurrent endometrial/ovarian cancer: s/p complete resection with negative margins and s/p adjuvant radiation. No chemotherapy (in the recurrent setting) due to chronic idiopathic neutropenia. She has a history of primary adjuvant chemotherapy after her initial diagnosis of stage III endometrial cancer in 2006. CT imaging and extensive pelvic surgical  evaluation in 2020 demonstrated complete clinical response. Imaging in early 2022 with no definitive evidence of recurrent disease. CT imaging in early 2023 and again this year is negative for evidence of recurrence.    Will continue annual surveillance exams until 2026.    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 She has persistent vaginal drainage despite cystectomy and total proctectomy. She patient continues to have vaginal and rectal bleeding. She's been previously counseled that in the setting of vagina having been left in situ at the time of her exenteration in 04/2019, surgery to performed total vaginectomy and placement of myocutaneous flap would require another major surgery that would likely involve having her colostomy and ileal conduit having to be taken down and would be associated with significant morbidity.  Additionally there would be no guarantee that her tissues,  severely damaged by radiation, would heal such as a second surgery and result in good outcomes. After the patient stopped her Eliquis, this significantly helped decrease her bleeding. Hyperbaric oxygen seem to help some also.  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  Had previously discussed with interventional radiology with recommendation against consideration of embolization or drain placement.   6) Neutropenia - Dr Bertis RuddyGorsuch is managing this and we appreciate. Patient is on granix.    7) Breast cancer - history of stage IIA. ER/PR positive. Treated with surgery and arimidex.  Given new breast lump, patient is scheduled to see her breast surgeon next week.   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 Patient follows-up with Dr Matthias HughsBuccini for close colon cancer and upper GI cancer surveillance   I have asked the patient to call me if and when she develops recurrent increasing vaginal bleeding or discharge.  I think she likely is going to need intermittent courses of antibiotic therapy as this seems to be our best tool in treating her pelvic symptoms.   From a cancer standpoint, I will continue to see her every 6 months.  28 minutes of total time was spent for this patient encounter, including preparation, face-to-face counseling with the patient and coordination of care, and documentation of the encounter.  Eugene GarnetKatherine Evelene Roussin, MD  Division of Gynecologic Oncology  Department of Obstetrics and Gynecology  [facility]

## 2022-11-17 ENCOUNTER — Encounter: Payer: Self-pay | Admitting: Cardiology

## 2022-11-17 ENCOUNTER — Ambulatory Visit: Payer: BC Managed Care – PPO | Attending: Cardiology | Admitting: Cardiology

## 2022-11-17 VITALS — BP 120/72 | HR 60 | Ht 61.0 in | Wt 211.2 lb

## 2022-11-17 DIAGNOSIS — I440 Atrioventricular block, first degree: Secondary | ICD-10-CM

## 2022-11-17 DIAGNOSIS — I480 Paroxysmal atrial fibrillation: Secondary | ICD-10-CM | POA: Diagnosis not present

## 2022-11-17 NOTE — Patient Instructions (Addendum)
Medication Instructions:  Your physician recommends that you continue on your current medications as directed. Please refer to the Current Medication list given to you today.  *If you need a refill on your cardiac medications before your next appointment, please call your pharmacy*  Lab Work: None ordered.  If you have labs (blood work) drawn today and your tests are completely normal, you will receive your results only by: MyChart Message (if you have MyChart) OR A paper copy in the mail If you have any lab test that is abnormal or we need to change your treatment, we will call you to review the results.  Testing/Procedures: None ordered.  Follow-Up: At [facility], you and your health needs are our priority.  As part of our continuing mission to provide you with exceptional heart care, we have created designated Provider Care Teams.  These Care Teams include your primary Cardiologist (physician) and Advanced Practice Providers (APPs -  Physician Assistants and Nurse Practitioners) who all work together to provide you with the care you need, when you need it.  Dr. Lalla Brothers ordered referral to Dr. Guinevere Scarlet, General Cardiology.  Your next appointment:   1 year(s) EP App   The format for your next appointment:   In Person  Provider:   Steffanie Dunn, MD{or one of the following Advanced Practice Providers on your designated Care Team:

## 2022-11-17 NOTE — Progress Notes (Signed)
Electrophysiology Office Follow up Visit Note:    Date:  11/17/2022   ID:  [name], DOB 03/08/1956, MRN [id number]  PCP:  Zola Button, Grayling Congress, DO  [facility] Cardiologist:  None  [facility] Electrophysiologist:  Lanier Prude, MD    Interval History:    [name] is a 67 y.o. female who presents for a follow up visit. They were last seen in clinic by me 01/2022. After shared decision making we decided to proceed with loop recorder implant at follow-up. Eliquis discontinued and she was started on 81 mg ASA.  She was seen by Dr. Katrinka Blazing 07/14/2022 without further evidence of Afib. She denied palpitations. She was monitoring her heart rhythms with an Apple Watch. She requested follow-up with me for further recommendations.  Today, she is feeling pretty good. Maybe 3-4 times she has felt some minor palpitations/fluttering at night. Her Apple Watch has only detected NSR so far.  She remains compliant with her 81 mg ASA. Her blood pressure is well controlled today.  Her sister recently underwent atrial fibrillation ablation.   She denies any chest pain, shortness of breath, or peripheral edema. No lightheadedness, headaches, syncope, orthopnea, or PND.  Past medical, surgical, family, social histories reviewed.      ROS:   Please see the history of present illness.    (+) Palpitations All other systems reviewed and are negative.  EKGs/Labs/Other Studies Reviewed:    The following studies were reviewed today:   EKG:  EKG is personally reviewed.  11/17/2022: Normal sinus rhythm    Physical Exam:    VS:  BP 120/72   Pulse 60   Ht 5\' 1"  (1.549 m)   Wt 211 lb 3.2 oz (95.8 kg)   SpO2 99%   BMI 39.91 kg/m     Wt Readings from Last 3 Encounters:  11/17/22 211 lb 3.2 oz (95.8 kg)  11/05/22 209 lb (94.8 kg)  10/28/22 206 lb (93.4 kg)     GEN: Well nourished, well developed in no acute distress CARDIAC: RRR, no murmurs, rubs,  gallops PSYCHIATRIC:  Normal affect        ASSESSMENT:    1. Paroxysmal atrial fibrillation   2. First degree heart block    PLAN:    In order of problems listed above:   #Paroxysmal atrial fibrillation Maintaining sinus rhythm.  Monitoring her heart rhythm using an Apple watch.  Takes aspirin 81 mg by mouth once daily.  Follow-up in 1 year with EP APP.  Refer to Dr. Flora Lipps for general cardiology care.    Total time of encounter: 20 minutes total time of encounter, including face-to-face patient care, coordination of care and counseling regarding high complexity medical decision making re: AF.   Medication Adjustments/Labs and Tests Ordered: Current medicines are reviewed at length with the patient today.  Concerns regarding medicines are outlined above.   Orders Placed This Encounter  Procedures   Ambulatory referral to Cardiology   EKG 12-Lead   No orders of the defined types were placed in this encounter.   I,Mathew Stumpf,acting as a Neurosurgeon for Lanier Prude, MD.,have documented all relevant documentation on the behalf of Lanier Prude, MD,as directed by  Lanier Prude, MD while in the presence of Lanier Prude, MD.  I, Lanier Prude, MD, have reviewed all documentation for this visit. The documentation on 11/17/22 for the exam, diagnosis, procedures, and orders are all accurate and complete.  Signed, Steffanie Dunn, MD,  [facility], [facility] 11/17/2022 7:46 PM    Electrophysiology [facility]

## 2022-11-23 ENCOUNTER — Encounter: Payer: Self-pay | Admitting: Obstetrics and Gynecology

## 2022-11-23 ENCOUNTER — Ambulatory Visit (INDEPENDENT_AMBULATORY_CARE_PROVIDER_SITE_OTHER): Payer: BC Managed Care – PPO | Admitting: Obstetrics and Gynecology

## 2022-11-23 VITALS — BP 118/80 | HR 67 | Ht 61.0 in | Wt 209.0 lb

## 2022-11-23 DIAGNOSIS — N824 Other female intestinal-genital tract fistulae: Secondary | ICD-10-CM

## 2022-11-23 DIAGNOSIS — N1832 Chronic kidney disease, stage 3b: Secondary | ICD-10-CM | POA: Diagnosis not present

## 2022-11-23 DIAGNOSIS — E1122 Type 2 diabetes mellitus with diabetic chronic kidney disease: Secondary | ICD-10-CM | POA: Diagnosis not present

## 2022-11-23 DIAGNOSIS — E039 Hypothyroidism, unspecified: Secondary | ICD-10-CM | POA: Diagnosis not present

## 2022-11-23 DIAGNOSIS — N390 Urinary tract infection, site not specified: Secondary | ICD-10-CM | POA: Diagnosis not present

## 2022-11-23 DIAGNOSIS — Z9889 Other specified postprocedural states: Secondary | ICD-10-CM | POA: Diagnosis not present

## 2022-11-23 DIAGNOSIS — E1165 Type 2 diabetes mellitus with hyperglycemia: Secondary | ICD-10-CM | POA: Diagnosis not present

## 2022-11-23 DIAGNOSIS — I129 Hypertensive chronic kidney disease with stage 1 through stage 4 chronic kidney disease, or unspecified chronic kidney disease: Secondary | ICD-10-CM | POA: Diagnosis not present
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
## 2022-11-24 ENCOUNTER — Encounter: Payer: Self-pay | Admitting: Obstetrics and Gynecology

## 2022-11-24 ENCOUNTER — Other Ambulatory Visit: Payer: Self-pay | Admitting: Obstetrics and Gynecology

## 2022-11-24 DIAGNOSIS — Z9889 Other specified postprocedural states: Secondary | ICD-10-CM

## 2022-11-24 DIAGNOSIS — N390 Urinary tract infection, site not specified: Secondary | ICD-10-CM

## 2022-11-25 ENCOUNTER — Encounter (INDEPENDENT_AMBULATORY_CARE_PROVIDER_SITE_OTHER): Payer: Self-pay | Admitting: Family Medicine

## 2022-11-25 ENCOUNTER — Ambulatory Visit (INDEPENDENT_AMBULATORY_CARE_PROVIDER_SITE_OTHER): Payer: BC Managed Care – PPO | Admitting: Family Medicine

## 2022-11-25 ENCOUNTER — Other Ambulatory Visit (INDEPENDENT_AMBULATORY_CARE_PROVIDER_SITE_OTHER): Payer: Self-pay | Admitting: Family Medicine

## 2022-11-25 VITALS — BP 135/79 | HR 63 | Temp 98.3°F | Ht 61.0 in | Wt 203.0 lb

## 2022-11-25 DIAGNOSIS — E1169 Type 2 diabetes mellitus with other specified complication: Secondary | ICD-10-CM | POA: Diagnosis not present

## 2022-11-25 DIAGNOSIS — E669 Obesity, unspecified: Secondary | ICD-10-CM | POA: Diagnosis not present

## 2022-11-25 DIAGNOSIS — Z6838 Body mass index (BMI) 38.0-38.9, adult: Secondary | ICD-10-CM | POA: Diagnosis not present

## 2022-11-25 DIAGNOSIS — F3289 Other specified depressive episodes: Secondary | ICD-10-CM

## 2022-11-25 MED ORDER — BUPROPION HCL 100 MG PO TABS: 100.0000 mg | ORAL_TABLET | Freq: Two times a day (BID) | ORAL | 0 refills | Status: DC

## 2022-11-30 ENCOUNTER — Encounter (INDEPENDENT_AMBULATORY_CARE_PROVIDER_SITE_OTHER): Payer: Self-pay | Admitting: Family Medicine

## 2022-11-30 DIAGNOSIS — E1169 Type 2 diabetes mellitus with other specified complication: Secondary | ICD-10-CM

## 2022-11-30 MED ORDER — TIRZEPATIDE 10 MG/0.5ML ~~LOC~~ SOAJ
10.0000 mg | SUBCUTANEOUS | 0 refills | Status: DC
Start: 2022-11-30 — End: 2022-12-30

## 2022-11-30 NOTE — Progress Notes (Signed)
Chief Complaint:   OBESITY [name] is here to discuss her progress with her obesity treatment plan along with follow-up of her obesity related diagnoses. [name] is on practicing portion control and making smarter food choices, such as increasing vegetables and decreasing simple carbohydrates and states she is following her eating plan approximately 80% of the time. [name] states she has been doing a little walking.   Today's visit was #: 67 Starting weight: 208 lbs Starting date: 04/05/2018 Today's weight: 203 lbs Today's date: 11/25/2022 Total lbs lost to date: 5 Total lbs lost since last in-office visit: 3  Interim History: [name] is doing well with her weight loss.  She is struggling with GI issues and she notes a more rapid GI transit, and her hunger has increased.  Subjective:   1. Type 2 diabetes mellitus with other specified complication, unspecified whether long term insulin use (HCC) [name] has been off Mounjaro 12 mg due to increased GI upset.  She notes a much faster GI transit and some of her pills are coming out of colostomy completely intact.   2. Emotional Eating Behavior [name] is Wellbutrin is not being dissolved (see under diabetes mellitus).  She is on the SR version.  Assessment/Plan:   1. Type 2 diabetes mellitus with other specified complication, unspecified whether long term insulin use (HCC) [name] agreed to change Mounjaro to 10 mg once weekly.  2. Emotional Eating Behavior Lean agreed to discontinue Wellbutrin SR, and start bupropion 100 mg twice daily immediate release and crush if needed.  - buPROPion (WELLBUTRIN) 100 MG tablet; Take 1 tablet (100 mg total) by mouth 2 (two) times daily.  Dispense: 60 tablet; Refill: 0  3. BMI 38.0-38.9,adult  4. Obesity, Beginning BMI 38.04 [name] is currently in the action stage of change. As such, her goal is to continue with weight loss efforts. She has agreed to practicing portion control and making smarter food  choices, such as increasing vegetables and decreasing simple carbohydrates.   Exercise goals: All adults should avoid inactivity. Some physical activity is better than none, and adults who participate in any amount of physical activity gain some health benefits.  Behavioral modification strategies: increasing lean protein intake and decreasing simple carbohydrates.  [name] has agreed to follow-up with our clinic in 4 weeks. She was informed of the importance of frequent follow-up visits to maximize her success with intensive lifestyle modifications for her multiple health conditions.   Objective:   Blood pressure 135/79, pulse 63, temperature 98.3 F (36.8 C), height 5\' 1"  (1.549 m), weight 203 lb (92.1 kg), SpO2 98 %. Body mass index is 38.36 kg/m.  Lab Results  Component Value Date   CREATININE 2.42 (H) 08/19/2022   BUN 35 (H) 08/19/2022   NA 138 08/19/2022   K 4.3 08/19/2022   CL 105 08/19/2022   CO2 24 08/19/2022   Lab Results  Component Value Date   ALT 26 08/19/2022   AST 18 08/19/2022   ALKPHOS 194 (H) 08/19/2022   BILITOT 0.3 08/19/2022   Lab Results  Component Value Date   HGBA1C 6.1 02/15/2022   HGBA1C 6.0 07/23/2021   HGBA1C 6.3 (H) 06/16/2020   HGBA1C 7.9 (H) 09/01/2017   HGBA1C 5.3 04/01/2017   Lab Results  Component Value Date   INSULIN 14.1 06/16/2020   Lab Results  Component Value Date   TSH 3.60 08/19/2022   Lab Results  Component Value Date   CHOL 66 08/19/2022   HDL 36.40 (L) 08/19/2022  LDLCALC 10 08/19/2022   TRIG 99.0 08/19/2022   CHOLHDL 2 08/19/2022   Lab Results  Component Value Date   VD25OH 31.09 02/15/2022   VD25OH 25.9 (L) 06/16/2020   VD25OH 40.8 04/05/2018   Lab Results  Component Value Date   WBC 20.4 (H) 10/04/2022   HGB 12.2 10/04/2022   HCT 37.9 10/04/2022   MCV 94.3 10/04/2022   PLT 186 10/04/2022   Lab Results  Component Value Date   IRON 7 (L) 08/31/2017   TIBC 164 (L) 08/31/2017   FERRITIN 27 11/29/2014    Attestation Statements:   Reviewed by clinician on day of visit: allergies, medications, problem list, medical history, surgical history, family history, social history, and previous encounter notes.  I have personally spent 42 minutes total time today in preparation, patient care, and documentation for this visit, including the following: review of clinical lab tests; review of medical tests/procedures/services.  I, Burt Knack, am acting as transcriptionist for Quillian Quince, MD.  I have reviewed the above documentation for accuracy and completeness, and I agree with the above. -  Quillian Quince, MD

## 2022-12-03 ENCOUNTER — Telehealth: Payer: Self-pay

## 2022-12-03 NOTE — Telephone Encounter (Signed)
Call placed to [employer] for prior authorization request for patient's Granix 454mcg/0.8ml Injection. Case # [id number]. Medication is approved through 06/01/2023.

## 2022-12-07 ENCOUNTER — Other Ambulatory Visit: Payer: Self-pay

## 2022-12-07 ENCOUNTER — Ambulatory Visit (INDEPENDENT_AMBULATORY_CARE_PROVIDER_SITE_OTHER): Payer: BC Managed Care – PPO | Admitting: Infectious Diseases

## 2022-12-07 ENCOUNTER — Encounter: Payer: Self-pay | Admitting: Infectious Diseases

## 2022-12-07 VITALS — BP 136/84 | HR 69 | Resp 16 | Ht 61.0 in | Wt 203.0 lb

## 2022-12-07 DIAGNOSIS — N321 Vesicointestinal fistula: Secondary | ICD-10-CM

## 2022-12-07 DIAGNOSIS — Z87440 Personal history of urinary (tract) infections: Secondary | ICD-10-CM

## 2022-12-07 DIAGNOSIS — N390 Urinary tract infection, site not specified: Secondary | ICD-10-CM | POA: Diagnosis not present

## 2022-12-07 DIAGNOSIS — Z936 Other artificial openings of urinary tract status: Secondary | ICD-10-CM

## 2022-12-07 DIAGNOSIS — N824 Other female intestinal-genital tract fistulae: Secondary | ICD-10-CM

## 2022-12-07 NOTE — Progress Notes (Signed)
Patient Active Problem List   Diagnosis Date Noted   BMI 38.0-38.9,adult 11/25/2022   Abdominal pain, RLQ 10/28/2022   Chronic renal impairment, stage 3b (HCC) 09/30/2022   Presence of urostomy (HCC) 09/04/2022   Serratia 09/04/2022   BMI 40.0-44.9, adult (HCC) 09/02/2022   Obesity, Beginning BMI 38.04 09/02/2022   Dysuria 08/19/2022   Type 2 diabetes mellitus with other specified complication (HCC) 03/22/2022   Type 2 diabetes mellitus with stage 4 chronic kidney disease, without long-term current use of insulin (HCC) 12/10/2021   Vaginal bleeding 12/10/2021   Vulvar lesion 08/18/2021   DM (diabetes mellitus) type II, controlled, with peripheral vascular disorder (HCC) 01/20/2021   First degree heart block 01/18/2020   Paroxysmal atrial fibrillation (HCC) 01/18/2020   Swelling of right parotid gland 10/03/2019   Vesicorectal fistula 02/26/2019   Chronic pain    Depression 01/16/2019   Other chronic postprocedural pain 05/25/2018   Recurrent UTI (urinary tract infection) 03/08/2018   HLD (hyperlipidemia) 11/29/2017   GERD (gastroesophageal reflux disease) 11/29/2017   Type II diabetes mellitus with renal manifestations (HCC) 11/29/2017   Preventative health care    Poor venous access    Acute renal failure superimposed on chronic kidney disease (HCC) 10/02/2017   Decubitus ulcer of sacral region, stage 2 (HCC) 10/02/2017   MDD (major depressive disorder), single episode, moderate (HCC)    Poorly controlled diabetes mellitus (HCC) 08/31/2017   Pressure injury of skin 08/31/2017   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 08/31/2017   History of external beam radiation therapy to pelvis 08/31/2017   ARF (acute renal failure) (HCC) 08/30/2017   Adjustment disorder with mixed anxiety and depressed mood 08/30/2017   Dehydration 08/29/2017   CKD (chronic kidney disease), stage III (HCC) 08/24/2017   Wound of right buttock 08/10/2017   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HCC) 06/20/2017    Postoperative anemia 04/04/2017   Pelvic cancer s/p colostomy takedown/loop ileostomy diversion 04/01/2017 04/01/2017   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 04/01/2017   Ureteral stricture, right, 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) 04/01/2017 09/10/2016   Vitamin D deficiency 08/30/2016   Genetic testing 11/27/2015   Breast cancer of upper-inner quadrant of left female breast (HCC)    Pelvic pain in female 03/19/2015   Chronic anemia 12/30/2014   UTI (urinary tract infection)    Pancytopenia, acquired (HCC) 11/26/2014   Class 2 severe obesity with serious comorbidity and body mass index (BMI) of 38.0 to 38.9 in adult [facility]) 11/20/2014   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 s/p resection/partial vaginectomy/ LAR/colostomy 11/19/2014 11/19/2014   Abdominal pain 09/09/2014   Postmenopausal bleeding 09/05/2014   History of ovarian & endometrial cancer 07/17/2012   Chronic neutropenia (HCC) 12/14/2011   Hypothyroidism 12/14/2011   DM type 2 (diabetes mellitus, type 2) (HCC) 12/14/2011   Benign essential HTN 12/14/2011    Patient's Medications  New Prescriptions   No medications on file  Previous Medications   ASPIRIN EC 81 MG TABLET    Take 1 tablet (81 mg total) by mouth daily. Swallow whole.   BIOTIN 5 MG TABS    Take 5 mg by mouth daily.    BUPROPION (WELLBUTRIN) 100 MG TABLET    Take 1 tablet (100 mg total) by mouth 2 (two) times daily.   CALCIUM CITRATE-VITAMIN D (CITRACAL+D) 315-200 MG-UNIT TABLET    Take 1 tablet by mouth daily.   CHOLECALCIFEROL (VITAMIN D3) 10000 UNITS CAPSULE    Take 10,000 Units by mouth See  admin instructions. Take 1 tablet by mouth 43times per week.   CLOBETASOL (OLUX) 0.05 % TOPICAL FOAM    Apply topically 2 (two) times daily.   CONTINUOUS BLOOD GLUC SENSOR (FREESTYLE LIBRE 3 SENSOR) MISC    Inject 1 sensor to the skin every 14 days for continuous glucose monitoring.   CRANBERRY-VITAMIN C 15000-100 MG CAPS    Take by  mouth daily. 1 in Morning and 1 in PM   DOXYCYCLINE (VIBRAMYCIN) 100 MG CAPSULE    Take 100 mg by mouth 2 (two) times daily.   FAMOTIDINE-CALCIUM CARBONATE-MAGNESIUM HYDROXIDE (PEPCID COMPLETE) 10-800-165 MG CHEWABLE TABLET    Chew 1 tablet by mouth as needed.   FERROUS SULFATE 325 (65 FE) MG TABLET    Take 1 tablet (325 mg total) by mouth at bedtime.   FLUTICASONE (FLONASE) 50 MCG/ACT NASAL SPRAY    Place 2 sprays into both nostrils daily.   HYDROCODONE BIT-HOMATROPINE (HYCODAN) 5-1.5 MG/5ML SYRUP    Take 5 mLs by mouth every 6 (six) hours as needed for cough.   LEVOTHYROXINE (SYNTHROID, LEVOTHROID) 150 MCG TABLET    Take 1 tablet (150 mcg total) by mouth daily before breakfast.   LORATADINE (CLARITIN) 10 MG TABLET    Take 10 mg by mouth daily.    OMEGA-3 ACID ETHYL ESTERS (LOVAZA) 1 G CAPSULE    Take 1 g by mouth 2 (two) times daily.    POLYETHYL GLYCOL-PROPYL GLYCOL (SYSTANE OP)    Place 1 drop into both eyes daily as needed (dry eyes).    PREGABALIN (LYRICA) 50 MG CAPSULE    TAKE 1 CAPSULE TWICE A DAY   PRENATAL VIT-FE FUMARATE-FA (PRENATAL VITAMIN PO)    Take 1 capsule by mouth daily. Takes prenatal because there are no dyes in it   PROBIOTIC PRODUCT (ALIGN PO)    Take 1 capsule by mouth daily.   ROSUVASTATIN (CRESTOR) 10 MG TABLET    Take 10 mg by mouth every evening.   SACCHAROMYCES BOULARDII (FLORASTOR PO)    Take 1 capsule by mouth at bedtime.   TAZAROTENE (ARAZLO) 0.045 % LOTN    Apply 1 application topically daily.   TBO-FILGRASTIM (GRANIX) 480 MCG/0.8ML SOSY INJECTION    480 mcg subcutaneous injection every 7 days life long480 mcg subcutaneous injection every 7 days life long   TIRZEPATIDE (MOUNJARO) 10 MG/0.5ML PEN    Inject 10 mg into the skin once a week.   TIZANIDINE (ZANAFLEX) 4 MG TABLET    Take 0.5-1 tablets (2-4 mg total) by mouth every 8 (eight) hours as needed for muscle spasms.   XIFAXAN 550 MG TABS TABLET    Take 550 mg by mouth as needed.  Modified Medications   No  medications on file  Discontinued Medications   No medications on file    Subjective: 67 Y O female with PMH as below including recurrent endometrioid endometrial/ovarian cancer s/p exploratory laparotomy, posterior supralevator pelvic exenteration, end colostomy, bladder repair, ureteral stenting with complete resection and negative margins on 11/09/14 S/p adjuvant radiation completed 03/10/15 disease free since this time, course further complicated by ostoperative and post-radiation right hydroureter and distal ureteral obstruction.  Subsequent attempt at ureteral reimplantation and reversal of colostomy which resulted in development of a complex colocystovaginal fistula status post pelvic exenteration procedure at [facility] in September 2020 with proctectomy and cystectomy and formation of permanent colostomy and ileal conduit. Vaginectomy not performed with ongoing vaginal.rectal bleeding and discharge + Breast ca s/p bilateral mastectomy s/p reconstruction who  is referred from PCP for evaluation and management of recurrent UTI.   Reports she gets UTI every quarter in a year. Describes UTI symptoms as urethral burning, lower abdominal pressure like sensation, hurting when she bears down, poor smell as well as whitish flakes in her urinary bag. Denies fevers , may be chills+. Feels her back pain gets worse when she has UTI although she has h/o back pain due to OA. She collects her urine from the urobag. She has also been struggling with vaginal as well as rectal discharge ( sero-sanguinous to sanguinous with sometimes passing blood clots) due to her h/o fistula. Reports ' takes 2 round of abtx to knock of all her UTI symptoms". Recently completed a 10 days course of Ciprofloxacin for UTI prescribed by UTI( 1/18 uirne cx mixed genital flora). 1/19 urine cx - 50,000-100,000 colonies MRSE. She reports being on macrobid for almost 2 years around 2020 but denies any benefit from suppressive abtx.   Follows  Gyn Oncology Dr Pricilla Holm for cancer fu. I have reviewed her last note from 06/04/22 with a complicated surgical history.  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 She has persistent vaginal drainage despite cystectomy and total proctectomy. She continues to have vaginal and rectal bleeding. She's been previously counseled that in the setting of vagina having been left in situ at the time of her exenteration in 04/2019, surgery to performed total vaginectomy and placement of myocutaneous flap would require another major surgery that would likely involve having her colostomy and ileal conduit having to be taken down and would be associated with significant morbidity.  Additionally there would be no guarantee that her tissues, severely damaged by radiation, would heal such as a second surgery and result in good outcomes. After the patient stopped her Eliquis, this significantly helped decrease her bleeding. Hyperbaric oxygen seem to help some also.  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  Had previously discussed with interventional radiology with recommendation against consideration of embolization or drain placement."  She follows Plastic surgery Dr Marzetta Board for Breast ca at [facility]   12/07/22 After last seen, she has been seen by HemOnc Dr Bertis Ruddy 3/4 for chronic neutropenia and on Granix as well as Gyn onc Dr Pricilla Holm 4/5 where it was noted " she would need another major surgery that would likely involve  having her colostomy and ileal conduit having to be taken down and would be associated with significant morbidity.  Additionally there would be no guarantee that her tissues, severely damaged by radiation, would heal such as a second surgery and result in good outcomes.  After the patient stopped her Eliquis,  this significantly helped decrease her bleeding. Hyperbaric oxygen seem to help some also.  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  Had previously discussed with interventional radiology with recommendation against consideration of embolization or drain placement. No significant change on recent imaging. "   She was also seen by Gynecology 4/23  where it was noted repair for her colovaginal fistula would not be recommended due to complex surgical history. She was also referred to Urologist at [facility] who specialized in these complex cases. Her appt with Urology at [facility] is on October. She reports she feels better when she was taking abtx with  improved drainage/bleeding from the vagina/rectum. She was able to stand and walk better. 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(TAH w/ BSO) :  recurrent 11-19-2014 post pelvic surgery and radiation 01-29-2015 to 03-10-2015   Chronic idiopathic neutropenia (HCC)    presumed related to chemotherapy March 2006--- followed by dr Bertis Ruddy (treatment w/ G-CSF injections   Chronic nausea    CKD (chronic kidney disease), stage III [facility])     nephrologist-  dr Bufford Buttner   Difficult intravenous access    small veins--- hx PICC lines   DM type 2 (diabetes mellitus, type 2) (HCC)    endocriologist--  dr Joselyn Glassman Janee Morn   (08-18-2021  per pt checks blood sugar daily in am,  fasting sugar--92--130)   Dysrhythmia    Environmental and seasonal allergies    Generalized muscle weakness    GERD (gastroesophageal reflux disease)    Hiatal hernia    History of abdominal abscess 04/16/2017   post surgery 04-01-2017  --- resolved 10/ 2018   History of COVID-19 05/2021   per pt mild symptoms that resolved   History of gastric polyp    2014  duodenum   History of ileus 04/16/2017   resolved w/ no surgical intervention   History of radiation therapy    01-29-2015 to 03-10-2015  pelvis 50.4Gy   History of small bowel obstruction 01/2019   Hypothyroidism    monitored by dr Casimiro Needle altheimer   Ileostomy in place [facility]) 04/01/2017   created at same time colostomy takedown./  permnant 09/ 2020   Mixed dyslipidemia    Multiple thyroid nodules    Managed by Dr. Gerrit Friends   PAF (paroxysmal atrial fibrillation) [facility])    cardiologist--- dr h. 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   Vitamin D deficiency    Vulval lesion    Wears glasses    Past Surgical History:  Procedure Laterality Date   APPENDECTOMY     BREAST RECONSTRUCTION WITH PLACEMENT OF TISSUE EXPANDER AND FLEX HD (ACELLULAR HYDRATED DERMIS) Bilateral 12/11/2015   Procedure: BILATERAL BREAST RECONSTRUCTION WITH PLACEMENT OF TISSUE EXPANDERS;  Surgeon: Glenna Fellows, MD;  Location: [facility];  Service: Plastics;  Laterality: Bilateral;    COLONOSCOPY WITH PROPOFOL N/A 08/21/2013   Procedure: COLONOSCOPY WITH PROPOFOL;  Surgeon: Florencia Reasons, MD;  Location: [facility];  Service: Endoscopy;  Laterality: N/A;  COLOSTOMY TAKEDOWN N/A 12/04/2014   Procedure: LAPROSCOPIC LYSIS OF ADHESIONS, SPLENIC MOBILIZATION, RELOCATION OF COLOSTOMY, DEBRIDEMENT INITIAL COLOSTOMY SITE;  Surgeon: Karie Soda, MD;  Location: [facility];  Service: General;  Laterality: N/A;   CYSTECTOMY W/ URETEROILEAL CONDUIT  04/12/2019   @[facility] ;   INTESTINAL ANASTOMOSIS, PANNICULECTOMY, PELVIS EXENTENTION,  PARTIAL COLECTOMY REMOVAL ILEIUM WITH PERMNANT ILEOCOLOSTOMY   CYSTOGRAM N/A 06/01/2017   Procedure: CYSTOGRAM;  Surgeon: Sebastian Ache, MD;  [facility];  Service: Urology;  Laterality: N/A;   CYSTOSCOPY W/ RETROGRADES Right 11/21/2015   Procedure: CYSTOSCOPY WITH RETROGRADE PYELOGRAM;  Surgeon: Sebastian Ache, MD;  [facility];  Service: Urology;  Laterality: Right;   CYSTOSCOPY W/ URETERAL STENT PLACEMENT Right 11/21/2015   Procedure: CYSTOSCOPY WITH STENT REPLACEMENT;  Surgeon: Sebastian Ache, MD;  [facility];  Service: Urology;  Laterality: Right;   CYSTOSCOPY W/ URETERAL STENT PLACEMENT Right 03/10/2016   Procedure: CYSTOSCOPY WITH STENT REPLACEMENT;  Surgeon: Sebastian Ache, MD;  Location: [facility];  Service: Urology;  Laterality: Right;   CYSTOSCOPY W/ URETERAL STENT PLACEMENT Right 06/30/2016   Procedure: CYSTOSCOPY WITH RETROGRADE PYELOGRAM/URETERAL STENT EXCHANGE;  Surgeon: Sebastian Ache, MD;  Location: [facility];  Service: Urology;  Laterality: Right;   CYSTOSCOPY W/ URETERAL STENT PLACEMENT N/A 06/01/2017   Procedure: CYSTOSCOPY WITH EXAM UNDER ANESTHESIA;  Surgeon: Sebastian Ache, MD;  [facility];  Service: Urology;  Laterality: N/A;   CYSTOSCOPY W/ URETERAL STENT PLACEMENT Right 08/17/2017   Procedure: CYSTOSCOPY WITH RETROGRADE PYELOGRAM/URETERAL STENT REMOVAL;  Surgeon: Sebastian Ache, MD;  Location: [facility]  Service: Urology;  Laterality: Right;   CYSTOSCOPY WITH RETROGRADE PYELOGRAM, URETEROSCOPY AND STENT PLACEMENT Right 03/20/2015   Procedure: CYSTOSCOPY WITH RETROGRADE PYELOGRAM, URETEROSCOPY WITH BALLOON DILATION AND STENT PLACEMENT ON RIGHT;  Surgeon: Sebastian Ache, MD;  Location: [facility];  Service: Urology;  Laterality: Right;   CYSTOSCOPY WITH RETROGRADE PYELOGRAM, URETEROSCOPY AND STENT PLACEMENT Right 05/02/2015   Procedure: CYSTOSCOPY WITH RIGHT RETROGRADE PYELOGRAM,  DIAGNOSTIC URETEROSCOPY AND STENT PULL ;  Surgeon: Sebastian Ache, MD;  Location: [facility];  Service: Urology;  Laterality: Right;   CYSTOSCOPY WITH RETROGRADE PYELOGRAM, URETEROSCOPY AND STENT PLACEMENT Right 09/05/2015   Procedure: CYSTOSCOPY WITH RETROGRADE PYELOGRAM,  AND STENT PLACEMENT;  Surgeon: Sebastian Ache, MD;  [facility];  Service: Urology;  Laterality: Right;   CYSTOSCOPY WITH RETROGRADE PYELOGRAM, URETEROSCOPY AND STENT PLACEMENT Right 04/01/2017   Procedure: CYSTOSCOPY WITH RETROGRADE PYELOGRAM, URETEROSCOPY AND STENT PLACEMENT;  Surgeon: Sebastian Ache, MD;  [facility];  Service: Urology;  Laterality: Right;   CYSTOSCOPY WITH STENT PLACEMENT Right 10/27/2016   Procedure: CYSTOSCOPY WITH STENT CHANGE and right retrograde pyelogram;  Surgeon: Sebastian Ache, MD;  Location: [facility];  Service: Urology;  Laterality: Right;   EUS N/A 10/02/2014   Procedure: LOWER ENDOSCOPIC ULTRASOUND (EUS);  Surgeon: Willis Modena, MD;  Location: [facility];  Service: Endoscopy;  Laterality: N/A;   EXCISION SOFT TISSUE MASS RIGHT FOREMAN  12/08/2006   EYE SURGERY  as child   pytosis of eyelids repair   INCISION AND DRAINAGE OF WOUND Bilateral 12/26/2015   Procedure: DEBRIDEMENT OF BILATERAL MASTECTOMY FLAPS;  Surgeon: Glenna Fellows, MD;  Location: [facility];  Service: Plastics;  Laterality:  Bilateral;   IR CV LINE INJECTION  05/31/2017   IR FLUORO GUIDE CV LINE LEFT  05/31/2017   IR FLUORO GUIDE CV LINE RIGHT  04/06/2017   IR FLUORO GUIDE  CV MIDLINE PICC RIGHT  05/30/2017   IR NEPHROSTOGRAM LEFT INITIAL PLACEMENT  09/02/2017   IR NEPHROSTOGRAM LEFT THRU EXISTING ACCESS  11/29/2017   IR NEPHROSTOGRAM RIGHT INITIAL PLACEMENT  09/02/2017   IR NEPHROSTOGRAM RIGHT THRU EXISTING ACCESS  09/13/2017   IR NEPHROSTOGRAM RIGHT THRU EXISTING ACCESS  11/29/2017   IR NEPHROSTOMY EXCHANGE LEFT  11/28/2017   IR NEPHROSTOMY EXCHANGE LEFT  01/05/2018   IR NEPHROSTOMY EXCHANGE LEFT  02/16/2018   IR NEPHROSTOMY EXCHANGE LEFT  03/30/2018   IR NEPHROSTOMY EXCHANGE LEFT  05/12/2018   IR NEPHROSTOMY EXCHANGE LEFT  06/21/2018   IR NEPHROSTOMY EXCHANGE LEFT  08/04/2018   IR NEPHROSTOMY EXCHANGE LEFT  09/18/2018   IR NEPHROSTOMY EXCHANGE LEFT  10/09/2018   IR NEPHROSTOMY EXCHANGE LEFT  10/27/2018   IR NEPHROSTOMY EXCHANGE LEFT  11/21/2018   IR NEPHROSTOMY EXCHANGE LEFT  01/05/2019   IR NEPHROSTOMY EXCHANGE LEFT  02/15/2019   IR NEPHROSTOMY EXCHANGE LEFT  03/29/2019   IR NEPHROSTOMY EXCHANGE RIGHT  10/02/2017   IR NEPHROSTOMY EXCHANGE RIGHT  11/28/2017   IR NEPHROSTOMY EXCHANGE RIGHT  01/05/2018   IR NEPHROSTOMY EXCHANGE RIGHT  02/16/2018   IR NEPHROSTOMY EXCHANGE RIGHT  03/30/2018   IR NEPHROSTOMY EXCHANGE RIGHT  05/12/2018   IR NEPHROSTOMY EXCHANGE RIGHT  06/21/2018   IR NEPHROSTOMY EXCHANGE RIGHT  08/04/2018   IR NEPHROSTOMY EXCHANGE RIGHT  09/18/2018   IR NEPHROSTOMY EXCHANGE RIGHT  10/27/2018   IR NEPHROSTOMY EXCHANGE RIGHT  11/21/2018   IR NEPHROSTOMY EXCHANGE RIGHT  01/05/2019   IR NEPHROSTOMY EXCHANGE RIGHT  02/15/2019   IR NEPHROSTOMY EXCHANGE RIGHT  03/29/2019   IR NEPHROSTOMY PLACEMENT LEFT  10/02/2017   IR RADIOLOGIST EVAL & MGMT  05/03/2017   IR US GUIDE VASC ACCESS LEFT  05/31/2017   IR US GUIDE VASC ACCESS RIGHT  04/06/2017   IR US GUIDE VASC ACCESS RIGHT  05/30/2017    LAPAROSCOPIC CHOLECYSTECTOMY  1990   LIPOSUCTION WITH LIPOFILLING Bilateral 04/16/2016   Procedure: LIPOSUCTION WITH LIPOFILLING TO BILATERAL CHEST;  Surgeon: Glenna Fellows, MD;  Location: [facility];  Service: Plastics;  Laterality: Bilateral;   MASTECTOMY W/ SENTINEL NODE BIOPSY Bilateral 12/11/2015   Procedure: RIGHT PROPHYLACTIC MASTECTOMY, LEFT TOTAL MASTECTOMY WITH LEFT AXILLARY SENTINEL LYMPH NODE BIOPSY;  Surgeon: Almond Lint, MD;  [facility];  Service: General;  Laterality: Bilateral;   OSTOMY N/A 11/19/2014   Procedure: OSTOMY;  Surgeon: Karie Soda, MD;  [facility];  Service: General;  Laterality: N/A;   PROCTOSCOPY N/A 04/01/2017   Procedure: RIDGE PROCTOSCOPY;  Surgeon: Karie Soda, MD;  [facility];  Service: General;  Laterality: N/A;   REMOVAL OF BILATERAL TISSUE EXPANDERS WITH PLACEMENT OF BILATERAL BREAST IMPLANTS Bilateral 04/16/2016   Procedure: REMOVAL OF BILATERAL TISSUE EXPANDERS WITH PLACEMENT OF BILATERAL BREAST IMPLANTS;  Surgeon: Glenna Fellows, MD;  Location: [facility];  Service: Plastics;  Laterality: Bilateral;   ROBOTIC ASSISTED LAP VAGINAL HYSTERECTOMY N/A 11/19/2014   Procedure: ROBOTIC LYSIS OF ADHESIONS, CONVERTED TO LAPAROTOMY RADICAL UPPER VAGINECTOMY,LOW ANTERIOR BOWEL RESECTION, COLOSTOMY, BILATERAL URETERAL STENT PLACEMENT AND CYSTONOMY CLOSURE;  Surgeon: Adolphus Birchwood, MD;  Location: [facility];  Service: Gynecology;  Laterality: N/A;   TISSUE EXPANDER FILLING Bilateral 12/26/2015   Procedure: EXPANSION OF BILATERAL CHEST TISSUE EXPANDERS (60 mL- Right; 75 mL- Left);  Surgeon: Glenna Fellows, MD;  Location: [facility];  Service: Plastics;  Laterality: Bilateral;   TONSILLECTOMY     TOTAL ABDOMINAL HYSTERECTOMY  March 2006  [facility]   and Bilateral Salpingoophorectomy/  staging for Ovarian cancer/  an   VULVAR LESION REMOVAL N/A 08/19/2021   Procedure: VULVAR LESION with biopsies;  Surgeon:  Carver Fila, MD;  Location: [facility];  Service: Gynecology;  Laterality: N/A;   XI ROBOTIC ASSISTED LOWER ANTERIOR RESECTION N/A 04/01/2017   Procedure: XI ROBOTIC VS LAPAROSCOPIC COLOSTOMY TAKEDOWN WITH LYSIS OF ADHESIONS.;  Surgeon: Karie Soda, MD;  [facility];  Service: General;  Laterality: N/A;  ERAS PATHWAY     Social History   Tobacco Use   Smoking status: Never   Smokeless tobacco: Never  Vaping Use   Vaping Use: Never used  Substance Use Topics   Alcohol use: Yes    Comment: Occ   Drug use: No    Family History  Problem Relation Age of Onset   Cancer Mother 9       stomach ca   Hypertension Mother    Cancer Father 25       prostate ca   Diabetes Father    Heart disease Father        CABG   Hypertension Father    Hyperlipidemia Father    Obesity Father    Breast cancer Maternal Aunt        dx in her 60s   Lymphoma Paternal Aunt    Brain cancer Paternal Grandfather    Ovarian cancer Other    Diabetes Sister    Hypertension Brother y-10   Heart disease Brother        CABG   Diabetes Brother     Allergies  Allergen Reactions   Penicillins Hives and Swelling    Facial swelling/childhood allergy Has patient had a PCN reaction causing immediate rash, facial/tongue/throat swelling, SOB or lightheadedness with hypotension: Yes Has patient had a PCN reaction causing severe rash involving mucus membranes or skin necrosis: Yes Has patient had a PCN reaction that required hospitalization yes Has patient had a PCN reaction occurring within the last 10 years: No If all of the above answers are "NO", then may proceed with Cephalosporin use.    Erythromycin Other (See Comments)    Gastritis, abd cramps   Tape Rash    blisters   Trimethoprim Rash   Ultram [Tramadol] Hives   Zarxio [Filgrastim] Other (See Comments)    Post injection, elevated heart rate, body feeling unwell, uneasy.    Cephalosporins Rash   Fluconazole Rash    Neomycin Rash    blisters   Oxycodone Other (See Comments)    " I just feel weird"   Pectin Rash    Pectin ring for stoma   Septra [Sulfamethoxazole-Trimethoprim] Rash   Sulfa Antibiotics Rash    Health Maintenance  Topic Date Due   COVID-19 Vaccine (7 - 2023-24 season) 04/02/2022   HEMOGLOBIN A1C  08/18/2022   Diabetic kidney evaluation - Urine ACR  02/16/2023   FOOT EXAM  02/16/2023   INFLUENZA VACCINE  03/03/2023   OPHTHALMOLOGY EXAM  04/01/2023   Diabetic kidney evaluation - eGFR measurement  11/05/2023   DTaP/Tdap/Td (5 - Td or Tdap) 08/27/2025   COLONOSCOPY (Pts 45-39yrs Insurance coverage will need to be confirmed)  11/20/2030   Pneumonia Vaccine 63+ Years old  Completed   DEXA SCAN  Completed   Hepatitis C Screening  Completed   Zoster Vaccines- Shingrix  Completed   HPV VACCINES  Aged Out    Objective: Resp 16   Ht 5\' 1"  (1.549 m)  BMI 38.36 kg/m    Physical Exam Constitutional:      Appearance: Normal appearance. Morbidly obese  HENT:     Head: Normocephalic and atraumatic.      Mouth: Mucous membranes are moist.  Eyes:    Conjunctiva/sclera: Conjunctivae normal.     Pupils: Pupils are equal, round, and bilaterally equal   Cardiovascular:     Rate and Rhythm: Normal rate and regular rhythm.     Heart sounds: s1s2  Pulmonary:     Effort: Pulmonary effort is normal.     Breath sounds: Normal breath sounds.   Abdominal:     General: Non distended     Palpations: soft. Colostomy with no surrounding cellulitis. Urostomy bag in urinary bag with no surrounding cellulitis   Musculoskeletal:        General: Normal range of motion.   Skin:    General: Skin is warm and dry.     Comments:  Neurological:     General: grossly non focal     Mental Status: awake, alert and oriented to person, place, and time.   Psychiatric:        Mood and Affect: Mood normal.   Lab Results Lab Results  Component Value Date   WBC 20.4 (H) 10/04/2022   HGB 12.2  10/04/2022   HCT 37.9 10/04/2022   MCV 94.3 10/04/2022   PLT 186 10/04/2022    Lab Results  Component Value Date   CREATININE 2.42 (H) 08/19/2022   BUN 35 (H) 08/19/2022   NA 138 08/19/2022   K 4.3 08/19/2022   CL 105 08/19/2022   CO2 24 08/19/2022    Lab Results  Component Value Date   ALT 26 08/19/2022   AST 18 08/19/2022   ALKPHOS 194 (H) 08/19/2022   BILITOT 0.3 08/19/2022    Lab Results  Component Value Date   CHOL 66 08/19/2022   HDL 36.40 (L) 08/19/2022   LDLCALC 10 08/19/2022   TRIG 99.0 08/19/2022   CHOLHDL 2 08/19/2022   No results found for: "LABRPR", "RPRTITER" No results found for: "HIV1RNAQUANT", "HIV1RNAVL", "CD4TABS"  Imaging 11/03/2022 CT abdomen/pelvis  IMPRESSION: 1. No acute intra-abdominal or pelvic pathology. No evidence of recurrent or metastatic disease. 2. Additional findings as above and similar to prior CT.   Assessment/Plan 89 Y O female with PMH as above including recurrent endometrioid endometrial/ovarian cancer s/p exploratory laparotomy, posterior supralevator pelvic exenteration, end colostomy, bladder repair, ureteral stenting with complete resection and negative margins on 11/09/14 S/p adjuvant radiation completed 03/10/15 disease free since this time, course further complicated by postoperative and post-radiation right hydroureter and distal ureteral obstruction.  Subsequent attempt at ureteral reimplantation and reversal of colostomy which resulted in development of a complex colocystovaginal fistula status post pelvic exenteration procedure at [facility] in September 2020 with proctectomy and cystectomy and formation of permanent colostomy and ileal conduit. 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 She has been referred to Urology at [facility] with appt in October.   She has been currently taking cefpodoxime with 2 remaining bottles for her UTI ( no urine cx  results available). She however feels better while on abtx with improvement in her symptoms and prefers to be on abtx.   I discussed with her regarding pros and cons of being on long term suppressive antibiotics including side effects, risk of resistance and may still fail without control of obvious source.   She will continue her current abtx for now and then will fu with me after seeing Urologist at [facility] to decide on long term abtx plan moving forward.   I have personally spent 45  minutes involved in face-to-face and non-face-to-face activities for this patient on the day of the visit. Professional time spent includes the following activities: Preparing to see the patient (review of tests), Obtaining and/or reviewing separately obtained history (admission/discharge record), Performing a medically appropriate examination and/or evaluation , Ordering medications/tests/procedures, referring and communicating with other health care professionals, Documenting clinical information in the EMR, Independently interpreting results (not separately reported), Communicating results to the patient/family/caregiver, Counseling and educating the patient/family/caregiver and Care coordination (not separately reported).   Victoriano Lain, MD [facility] 12/07/2022, 10:55 AM

## 2022-12-10 ENCOUNTER — Ambulatory Visit: Payer: BC Managed Care – PPO | Admitting: Gynecologic Oncology

## 2022-12-13 DIAGNOSIS — N824 Other female intestinal-genital tract fistulae: Secondary | ICD-10-CM | POA: Insufficient documentation

## 2022-12-17 ENCOUNTER — Other Ambulatory Visit (INDEPENDENT_AMBULATORY_CARE_PROVIDER_SITE_OTHER): Payer: Self-pay | Admitting: Family Medicine

## 2022-12-17 DIAGNOSIS — F3289 Other specified depressive episodes: Secondary | ICD-10-CM

## 2022-12-20 ENCOUNTER — Other Ambulatory Visit (INDEPENDENT_AMBULATORY_CARE_PROVIDER_SITE_OTHER): Payer: Self-pay | Admitting: Family Medicine

## 2022-12-20 DIAGNOSIS — E1169 Type 2 diabetes mellitus with other specified complication: Secondary | ICD-10-CM

## 2022-12-24 DIAGNOSIS — K6389 Other specified diseases of intestine: Secondary | ICD-10-CM | POA: Diagnosis not present

## 2022-12-24 DIAGNOSIS — Z86010 Personal history of colonic polyps: Secondary | ICD-10-CM | POA: Diagnosis not present

## 2022-12-24 DIAGNOSIS — K565 Intestinal adhesions [bands], unspecified as to partial versus complete obstruction: Secondary | ICD-10-CM | POA: Diagnosis not present

## 2022-12-24 DIAGNOSIS — Z933 Colostomy status: Secondary | ICD-10-CM | POA: Diagnosis not present

## 2022-12-29 ENCOUNTER — Encounter: Payer: Self-pay | Admitting: Family Medicine

## 2022-12-29 ENCOUNTER — Other Ambulatory Visit: Payer: Self-pay | Admitting: Family Medicine

## 2022-12-29 ENCOUNTER — Telehealth: Payer: Self-pay

## 2022-12-29 ENCOUNTER — Encounter: Payer: Self-pay | Admitting: Hematology and Oncology

## 2022-12-29 DIAGNOSIS — T753XXA Motion sickness, initial encounter: Secondary | ICD-10-CM

## 2022-12-29 MED ORDER — SCOPOLAMINE 1 MG/3DAYS TD PT72
1.00 | MEDICATED_PATCH | TRANSDERMAL | 12 refills | Status: DC
Start: 2022-12-29 — End: 2023-02-08

## 2022-12-29 NOTE — Telephone Encounter (Signed)
Called and given below message. She verbalized understanding and appreciated the call. She will call the office back with concerns.

## 2022-12-29 NOTE — Telephone Encounter (Signed)
-----   Message from Artis Delay, MD sent at 12/29/2022  9:17 AM EDT ----- Pls call her I am not aware she needs extra vaccine to go to [place] I have not recommended most patients to get RSV or COvid booster beyond 2 original doses If she wants then she can get them from the drug store

## 2022-12-30 ENCOUNTER — Encounter (INDEPENDENT_AMBULATORY_CARE_PROVIDER_SITE_OTHER): Payer: Self-pay | Admitting: Family Medicine

## 2022-12-30 ENCOUNTER — Ambulatory Visit (INDEPENDENT_AMBULATORY_CARE_PROVIDER_SITE_OTHER): Payer: BC Managed Care – PPO | Admitting: Family Medicine

## 2022-12-30 VITALS — BP 149/80 | HR 62 | Temp 98.6°F | Ht 61.0 in | Wt 207.0 lb

## 2022-12-30 DIAGNOSIS — Z6837 Body mass index (BMI) 37.0-37.9, adult: Secondary | ICD-10-CM | POA: Insufficient documentation

## 2022-12-30 DIAGNOSIS — F3289 Other specified depressive episodes: Secondary | ICD-10-CM

## 2022-12-30 DIAGNOSIS — E1169 Type 2 diabetes mellitus with other specified complication: Secondary | ICD-10-CM | POA: Diagnosis not present

## 2022-12-30 DIAGNOSIS — E669 Obesity, unspecified: Secondary | ICD-10-CM

## 2022-12-30 DIAGNOSIS — Z7985 Long-term (current) use of injectable non-insulin antidiabetic drugs: Secondary | ICD-10-CM

## 2022-12-30 DIAGNOSIS — Z6839 Body mass index (BMI) 39.0-39.9, adult: Secondary | ICD-10-CM

## 2022-12-30 MED ORDER — BUPROPION HCL 100 MG PO TABS: 100.0000 mg | ORAL_TABLET | Freq: Two times a day (BID) | ORAL | 0 refills | Status: DC

## 2022-12-30 MED ORDER — TIRZEPATIDE 10 MG/0.5ML ~~LOC~~ SOAJ
10.0000 mg | SUBCUTANEOUS | 0 refills | Status: DC
Start: 2022-12-30 — End: 2023-02-08

## 2023-01-03 NOTE — Progress Notes (Signed)
Chief Complaint:   OBESITY [name] is here to discuss her progress with her obesity treatment plan along with follow-up of her obesity related diagnoses. [name] is on keeping a food journal and adhering to recommended goals of 1300 calories and 80 grams of protein and practicing portion control and making smarter food choices, such as increasing vegetables and decreasing simple carbohydrates and states she is following her eating plan approximately 75% of the time. [name] states she is walking for 30 minutes 5 times per week.  Today's visit was #: 68 Starting weight: 208 lbs Starting date: 04/05/2018 Today's weight: 207 lbs Today's date: 12/30/2022 Total lbs lost to date: 1 Total lbs lost since last in-office visit: 0  Interim History: Patient has been increasing her walking as she is getting ready to do a lot of walking with getting ready to go to [place].  She noted some increase in hunger.  Subjective:   1. Type 2 diabetes mellitus with other specified complication, unspecified whether long term insulin use (HCC) Patient has been out of Mounjaro for 2 weeks due to pharmacy error.  She notes increased polyphagia.  2. Emotional Eating Behavior Patient is working on decreasing emotional eating behavior, and no side effects were noted.  Assessment/Plan:   1. Type 2 diabetes mellitus with other specified complication, unspecified whether long term insulin use (HCC) We will refill Mounjaro 10 mg once weekly for 1 month; prescription was sent to the pharmacy today and she is to send me a MyChart message if there is a problem.  - tirzepatide (MOUNJARO) 10 MG/0.5ML Pen; Inject 10 mg into the skin once a week.  Dispense: 2 mL; Refill: 0  2. Emotional Eating Behavior Patient will continue Wellbutrin, and we will refill for 1 month.  - buPROPion (WELLBUTRIN) 100 MG tablet; Take 1 tablet (100 mg total) by mouth 2 (two) times daily.  Dispense: 60 tablet; Refill: 0  3. BMI 39.0-39.9,adult  4.  Obesity, with starting BMI 38.04 [name] is currently in the action stage of change. As such, her goal is to continue with weight loss efforts. She has agreed to keeping a food journal and adhering to recommended goals of 1300 calories and 80+ grams of protein daily.   Exercise goals: As is.   Behavioral modification strategies: increasing lean protein intake, meal planning and cooking strategies, and travel eating strategies.  [name] has agreed to follow-up with our clinic in 4 to 5 weeks. She was informed of the importance of frequent follow-up visits to maximize her success with intensive lifestyle modifications for her multiple health conditions.   Objective:   Blood pressure (!) 149/80, pulse 62, temperature 98.6 F (37 C), height 5\' 1"  (1.549 m), weight 207 lb (93.9 kg), SpO2 100 %. Body mass index is 39.11 kg/m.  Lab Results  Component Value Date   CREATININE 2.42 (H) 08/19/2022   BUN 35 (H) 08/19/2022   NA 138 08/19/2022   K 4.3 08/19/2022   CL 105 08/19/2022   CO2 24 08/19/2022   Lab Results  Component Value Date   ALT 26 08/19/2022   AST 18 08/19/2022   ALKPHOS 194 (H) 08/19/2022   BILITOT 0.3 08/19/2022   Lab Results  Component Value Date   HGBA1C 6.1 02/15/2022   HGBA1C 6.0 07/23/2021   HGBA1C 6.3 (H) 06/16/2020   HGBA1C 7.9 (H) 09/01/2017   HGBA1C 5.3 04/01/2017   Lab Results  Component Value Date   INSULIN 14.1 06/16/2020   Lab Results  Component Value Date   TSH 3.60 08/19/2022   Lab Results  Component Value Date   CHOL 66 08/19/2022   HDL 36.40 (L) 08/19/2022   LDLCALC 10 08/19/2022   TRIG 99.0 08/19/2022   CHOLHDL 2 08/19/2022   Lab Results  Component Value Date   VD25OH 31.09 02/15/2022   VD25OH 25.9 (L) 06/16/2020   VD25OH 40.8 04/05/2018   Lab Results  Component Value Date   WBC 20.4 (H) 10/04/2022   HGB 12.2 10/04/2022   HCT 37.9 10/04/2022   MCV 94.3 10/04/2022   PLT 186 10/04/2022   Lab Results  Component Value Date   IRON 7  (L) 08/31/2017   TIBC 164 (L) 08/31/2017   FERRITIN 27 11/29/2014   Attestation Statements:   Reviewed by clinician on day of visit: allergies, medications, problem list, medical history, surgical history, family history, social history, and previous encounter notes.   I, Burt Knack, am acting as transcriptionist for Quillian Quince, MD.  I have reviewed the above documentation for accuracy and completeness, and I agree with the above. -  Quillian Quince, MD

## 2023-01-04 DIAGNOSIS — Z933 Colostomy status: Secondary | ICD-10-CM | POA: Diagnosis not present

## 2023-01-04 DIAGNOSIS — Z936 Other artificial openings of urinary tract status: Secondary | ICD-10-CM | POA: Diagnosis not present

## 2023-01-04 DIAGNOSIS — Z4801 Encounter for change or removal of surgical wound dressing: Secondary | ICD-10-CM | POA: Diagnosis not present

## 2023-01-04 DIAGNOSIS — L89319 Pressure ulcer of right buttock, unspecified stage: Secondary | ICD-10-CM | POA: Diagnosis not present

## 2023-01-11 ENCOUNTER — Other Ambulatory Visit: Payer: Self-pay | Admitting: Family Medicine

## 2023-01-11 DIAGNOSIS — G8929 Other chronic pain: Secondary | ICD-10-CM

## 2023-01-11 NOTE — Telephone Encounter (Signed)
Fayrene Fearing (spouse) called stating that pt is set to go on vacation and will run out of her medication towards the end of it. He stating that she would like to look into getting a vacation supply and that a vacation override code would have to be gotten from the pharmacy.   Prescription Request  01/11/2023  Is this a "Controlled Substance" medicine? No  LOV: 08/19/2022  What is the name of the medication or equipment?   pregabalin (LYRICA) 50 MG capsule [[id number]]   Have you contacted your pharmacy to request a refill? No   Which pharmacy would you like this sent to?   [id number] [place], [place] - [address] [place] [address] Phone: [phone] Fax: [phone]  Patient notified that their request is being sent to the clinical staff for review and that they should receive a response within 2 business days.   Please advise at Mobile [phone] (mobile)

## 2023-01-12 ENCOUNTER — Other Ambulatory Visit: Payer: Self-pay | Admitting: Family

## 2023-01-12 DIAGNOSIS — G8929 Other chronic pain: Secondary | ICD-10-CM

## 2023-01-12 MED ORDER — PREGABALIN 50 MG PO CAPS: 50.0000 mg | ORAL_CAPSULE | Freq: Two times a day (BID) | ORAL | 1 refills | Status: DC

## 2023-01-12 NOTE — Addendum Note (Signed)
Addended byConrad Pocahontas D on: 01/12/2023 12:20 PM   Modules accepted: Orders

## 2023-01-12 NOTE — Telephone Encounter (Signed)
Requesting: Lyrica 50mg   Contract: None UDS: None Last Visit: 12/30/22 Next Visit: 02/17/23 Last Refill:08/10/22 #60 and 5RF  Please Advise

## 2023-01-13 NOTE — Addendum Note (Signed)
Addended by: Rosita Kea on: 01/13/2023 02:37 PM   Modules accepted: Orders

## 2023-01-13 NOTE — Telephone Encounter (Signed)
Spoke with [place] from [facility] states they need a specific date to fill early refill (example: 6/13/20204 or closer to date patient is leaving out of town) Please advise.  Thanks   [id number] [place], [place] - [address] [place] [address] Phone: [phone] Fax: [phone]

## 2023-01-13 NOTE — Telephone Encounter (Signed)
Pharmacy is calling for additional approval regarding this refill for Lyrica. Rx has been sent already to:   [id number] [place], [place] - [address] [place] [address] Phone: [phone] Fax: [phone]

## 2023-01-14 MED ORDER — PREGABALIN 50 MG PO CAPS: 50.0000 mg | ORAL_CAPSULE | Freq: Two times a day (BID) | ORAL | 5 refills | Status: DC

## 2023-01-26 ENCOUNTER — Ambulatory Visit: Payer: BC Managed Care – PPO | Admitting: Cardiology

## 2023-02-04 NOTE — Progress Notes (Unsigned)
Cardiology Office Note:   Date:  02/04/2023  NAME:  [name]    MRN: [id number] DOB:  [birth year]   PCP:  Donato Schultz, DO  Cardiologist:  None  Electrophysiologist:  Lanier Prude, MD   Referring MD: Zola Button, Grayling Congress, *   No chief complaint on file.   History of Present Illness:   [name] is a 67 y.o. female with a hx of pAF, HTN, HLD, DM who presents follow-up.   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(TAH w/ BSO) :  recurrent 11-19-2014 post pelvic surgery and radiation 01-29-2015 to 03-10-2015   Chronic idiopathic neutropenia (HCC)    presumed related to chemotherapy March 2006--- followed by dr Bertis Ruddy (treatment w/ G-CSF injections   Chronic nausea    CKD (chronic kidney disease), stage III [facility])    nephrologist-  dr Bufford Buttner   Difficult intravenous access    small veins--- hx PICC lines   DM type 2 (diabetes mellitus, type 2) (HCC)    endocriologist--  dr Joselyn Glassman Janee Morn   (08-18-2021  per pt checks blood sugar daily in am,  fasting sugar--92--130)   Dysrhythmia    Environmental and seasonal allergies    Generalized muscle weakness    GERD (gastroesophageal reflux disease)    Hiatal hernia    History of abdominal abscess 04/16/2017   post surgery 04-01-2017  --- resolved 10/ 2018    History of COVID-19 05/2021   per pt mild symptoms that resolved   History of gastric polyp    2014  duodenum   History of ileus 04/16/2017   resolved w/ no surgical intervention   History of radiation therapy    01-29-2015 to 03-10-2015  pelvis 50.4Gy   History of small bowel obstruction 01/2019   Hypothyroidism    monitored by dr Casimiro Needle altheimer   Ileostomy in place [facility]) 04/01/2017   created at same time colostomy takedown./  permnant 09/ 2020   Mixed dyslipidemia    Multiple thyroid nodules    Managed by Dr. Gerrit Friends   PAF (paroxysmal atrial fibrillation) [facility])    cardiologist--- dr h. 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   Vitamin D deficiency    Vulval lesion    Wears glasses     Past Surgical History: Past Surgical History:  Procedure Laterality Date   APPENDECTOMY     BREAST RECONSTRUCTION WITH PLACEMENT OF TISSUE EXPANDER  AND FLEX HD (ACELLULAR HYDRATED DERMIS) Bilateral 12/11/2015   Procedure: BILATERAL BREAST RECONSTRUCTION WITH PLACEMENT OF TISSUE EXPANDERS;  Surgeon: Glenna Fellows, MD;  [facility]: [facility];  Service: Plastics;  Laterality: Bilateral;   COLONOSCOPY WITH PROPOFOL N/A 08/21/2013   Procedure: COLONOSCOPY WITH PROPOFOL;  Surgeon: Florencia Reasons, MD;  [facility];  Service: Endoscopy;  Laterality: N/A;   COLOSTOMY TAKEDOWN N/A 12/04/2014   Procedure: LAPROSCOPIC LYSIS OF ADHESIONS, SPLENIC MOBILIZATION, RELOCATION OF COLOSTOMY, DEBRIDEMENT INITIAL COLOSTOMY SITE;  Surgeon: Karie Soda, MD;  Location: [facility];  Service: General;  Laterality: N/A;   CYSTECTOMY W/ URETEROILEAL CONDUIT  04/12/2019   @[facility] ;    INTESTINAL ANASTOMOSIS, PANNICULECTOMY, PELVIS EXENTENTION,  PARTIAL COLECTOMY REMOVAL ILEIUM WITH PERMNANT ILEOCOLOSTOMY   CYSTOGRAM N/A 06/01/2017   Procedure: CYSTOGRAM;  Surgeon: Sebastian Ache, MD;  [facility];  Service: Urology;  Laterality: N/A;   CYSTOSCOPY W/ RETROGRADES Right 11/21/2015   Procedure: CYSTOSCOPY WITH RETROGRADE PYELOGRAM;  Surgeon: Sebastian Ache, MD;  [facility];  Service: Urology;  Laterality: Right;   CYSTOSCOPY W/ URETERAL STENT PLACEMENT Right 11/21/2015   Procedure: CYSTOSCOPY WITH STENT REPLACEMENT;  Surgeon: Sebastian Ache, MD;  [facility];  Service: Urology;  Laterality: Right;   CYSTOSCOPY W/ URETERAL STENT PLACEMENT Right 03/10/2016   Procedure: CYSTOSCOPY WITH STENT REPLACEMENT;  Surgeon: Sebastian Ache, MD;  Location[facility];  Service: Urology;  Laterality: Right;   CYSTOSCOPY W/ URETERAL STENT PLACEMENT Right 06/30/2016   Procedure: CYSTOSCOPY WITH RETROGRADE PYELOGRAM/URETERAL STENT EXCHANGE;  Surgeon: Sebastian Ache, MD;  Location: [facility];  Service: Urology;  Laterality: Right;   CYSTOSCOPY W/ URETERAL STENT PLACEMENT N/A 06/01/2017   Procedure: CYSTOSCOPY WITH EXAM UNDER ANESTHESIA;  Surgeon: Sebastian Ache, MD;  [facility];  Service: Urology;  Laterality: N/A;   CYSTOSCOPY W/ URETERAL STENT PLACEMENT Right 08/17/2017   Procedure: CYSTOSCOPY WITH RETROGRADE PYELOGRAM/URETERAL STENT REMOVAL;  Surgeon: Sebastian Ache, MD;  Location: [facility];  Service: Urology;  Laterality: Right;   CYSTOSCOPY WITH RETROGRADE PYELOGRAM, URETEROSCOPY AND STENT PLACEMENT Right 03/20/2015   Procedure: CYSTOSCOPY WITH RETROGRADE PYELOGRAM, URETEROSCOPY WITH BALLOON DILATION AND STENT PLACEMENT ON RIGHT;  Surgeon: Sebastian Ache, MD;  Location: [facility];  Service: Urology;  Laterality: Right;   CYSTOSCOPY WITH RETROGRADE PYELOGRAM, URETEROSCOPY AND STENT PLACEMENT Right 05/02/2015    Procedure: CYSTOSCOPY WITH RIGHT RETROGRADE PYELOGRAM,  DIAGNOSTIC URETEROSCOPY AND STENT PULL ;  Surgeon: Sebastian Ache, MD;  Location: [facility];  Service: Urology;  Laterality: Right;   CYSTOSCOPY WITH RETROGRADE PYELOGRAM, URETEROSCOPY AND STENT PLACEMENT Right 09/05/2015   Procedure: CYSTOSCOPY WITH RETROGRADE PYELOGRAM,  AND STENT PLACEMENT;  Surgeon: Sebastian Ache, MD;  [facility];  Service: Urology;  Laterality: Right;   CYSTOSCOPY WITH RETROGRADE PYELOGRAM, URETEROSCOPY AND STENT PLACEMENT Right 04/01/2017   Procedure: CYSTOSCOPY WITH RETROGRADE PYELOGRAM, URETEROSCOPY AND STENT PLACEMENT;  Surgeon: Sebastian Ache, MD;  [facility];  Service: Urology;  Laterality: Right;   CYSTOSCOPY WITH STENT PLACEMENT Right 10/27/2016   Procedure: CYSTOSCOPY WITH STENT CHANGE and right retrograde pyelogram;  Surgeon: Sebastian Ache, MD;  Location: [facility];  Service: Urology;  Laterality: Right;   EUS N/A 10/02/2014   Procedure: LOWER ENDOSCOPIC ULTRASOUND (EUS);  Surgeon: Willis Modena, MD;  Location: [facility];  Service: Endoscopy;  Laterality: N/A;   EXCISION SOFT TISSUE MASS RIGHT FOREMAN  12/08/2006   EYE SURGERY  as child   pytosis of eyelids repair  INCISION AND DRAINAGE OF WOUND Bilateral 12/26/2015   Procedure: DEBRIDEMENT OF BILATERAL MASTECTOMY FLAPS;  Surgeon: Glenna Fellows, MD;  Location: [facility];  Service: Plastics;  Laterality: Bilateral;   IR CV LINE INJECTION  05/31/2017   IR FLUORO GUIDE CV LINE LEFT  05/31/2017   IR FLUORO GUIDE CV LINE RIGHT  04/06/2017   IR FLUORO GUIDE CV MIDLINE PICC RIGHT  05/30/2017   IR NEPHROSTOGRAM LEFT INITIAL PLACEMENT  09/02/2017   IR NEPHROSTOGRAM LEFT THRU EXISTING ACCESS  11/29/2017   IR NEPHROSTOGRAM RIGHT INITIAL PLACEMENT  09/02/2017   IR NEPHROSTOGRAM RIGHT THRU EXISTING ACCESS  09/13/2017   IR NEPHROSTOGRAM RIGHT THRU EXISTING ACCESS  11/29/2017   IR NEPHROSTOMY  EXCHANGE LEFT  11/28/2017   IR NEPHROSTOMY EXCHANGE LEFT  01/05/2018   IR NEPHROSTOMY EXCHANGE LEFT  02/16/2018   IR NEPHROSTOMY EXCHANGE LEFT  03/30/2018   IR NEPHROSTOMY EXCHANGE LEFT  05/12/2018   IR NEPHROSTOMY EXCHANGE LEFT  06/21/2018   IR NEPHROSTOMY EXCHANGE LEFT  08/04/2018   IR NEPHROSTOMY EXCHANGE LEFT  09/18/2018   IR NEPHROSTOMY EXCHANGE LEFT  10/09/2018   IR NEPHROSTOMY EXCHANGE LEFT  10/27/2018   IR NEPHROSTOMY EXCHANGE LEFT  11/21/2018   IR NEPHROSTOMY EXCHANGE LEFT  01/05/2019   IR NEPHROSTOMY EXCHANGE LEFT  02/15/2019   IR NEPHROSTOMY EXCHANGE LEFT  03/29/2019   IR NEPHROSTOMY EXCHANGE RIGHT  10/02/2017   IR NEPHROSTOMY EXCHANGE RIGHT  11/28/2017   IR NEPHROSTOMY EXCHANGE RIGHT  01/05/2018   IR NEPHROSTOMY EXCHANGE RIGHT  02/16/2018   IR NEPHROSTOMY EXCHANGE RIGHT  03/30/2018   IR NEPHROSTOMY EXCHANGE RIGHT  05/12/2018   IR NEPHROSTOMY EXCHANGE RIGHT  06/21/2018   IR NEPHROSTOMY EXCHANGE RIGHT  08/04/2018   IR NEPHROSTOMY EXCHANGE RIGHT  09/18/2018   IR NEPHROSTOMY EXCHANGE RIGHT  10/27/2018   IR NEPHROSTOMY EXCHANGE RIGHT  11/21/2018   IR NEPHROSTOMY EXCHANGE RIGHT  01/05/2019   IR NEPHROSTOMY EXCHANGE RIGHT  02/15/2019   IR NEPHROSTOMY EXCHANGE RIGHT  03/29/2019   IR NEPHROSTOMY PLACEMENT LEFT  10/02/2017   IR RADIOLOGIST EVAL & MGMT  05/03/2017   IR US GUIDE VASC ACCESS LEFT  05/31/2017   IR US GUIDE VASC ACCESS RIGHT  04/06/2017   IR US GUIDE VASC ACCESS RIGHT  05/30/2017   LAPAROSCOPIC CHOLECYSTECTOMY  1990   LIPOSUCTION WITH LIPOFILLING Bilateral 04/16/2016   Procedure: LIPOSUCTION WITH LIPOFILLING TO BILATERAL CHEST;  Surgeon: Glenna Fellows, MD;  Location: [facility];  Service: Plastics;  Laterality: Bilateral;   MASTECTOMY W/ SENTINEL NODE BIOPSY Bilateral 12/11/2015   Procedure: RIGHT PROPHYLACTIC MASTECTOMY, LEFT TOTAL MASTECTOMY WITH LEFT AXILLARY SENTINEL LYMPH NODE BIOPSY;  Surgeon: Almond Lint, MD;  Location: [facility];  Service:  General;  Laterality: Bilateral;   OSTOMY N/A 11/19/2014   Procedure: OSTOMY;  Surgeon: Karie Soda, MD;  [facility];  Service: General;  Laterality: N/A;   PROCTOSCOPY N/A 04/01/2017   Procedure: RIDGE PROCTOSCOPY;  Surgeon: Karie Soda, MD;  [facility];  Service: General;  Laterality: N/A;   REMOVAL OF BILATERAL TISSUE EXPANDERS WITH PLACEMENT OF BILATERAL BREAST IMPLANTS Bilateral 04/16/2016   Procedure: REMOVAL OF BILATERAL TISSUE EXPANDERS WITH PLACEMENT OF BILATERAL BREAST IMPLANTS;  Surgeon: Glenna Fellows, MD;  Location: [facility];  Service: Plastics;  Laterality: Bilateral;   ROBOTIC ASSISTED LAP VAGINAL HYSTERECTOMY N/A 11/19/2014   Procedure: ROBOTIC LYSIS OF ADHESIONS, CONVERTED TO LAPAROTOMY RADICAL UPPER VAGINECTOMY,LOW ANTERIOR BOWEL RESECTION, COLOSTOMY, BILATERAL URETERAL STENT PLACEMENT AND CYSTONOMY CLOSURE;  Surgeon: Adolphus Birchwood, MD;  [facility];  Service: Gynecology;  Laterality: N/A;   TISSUE EXPANDER FILLING Bilateral 12/26/2015   Procedure: EXPANSION OF BILATERAL CHEST TISSUE EXPANDERS (60 mL- Right; 75 mL- Left);  Surgeon: Glenna Fellows, MD;  Location: [facility];  Service: Plastics;  Laterality: Bilateral;   TONSILLECTOMY     TOTAL ABDOMINAL HYSTERECTOMY  March 2006   [facility]   and Bilateral Salpingoophorectomy/  staging for Ovarian cancer/  an   VULVAR LESION REMOVAL N/A 08/19/2021   Procedure: VULVAR LESION with biopsies;  Surgeon: Carver Fila, MD;  Location: [facility];  Service: Gynecology;  Laterality: N/A;   XI ROBOTIC ASSISTED LOWER ANTERIOR RESECTION N/A 04/01/2017   Procedure: XI ROBOTIC VS LAPAROSCOPIC COLOSTOMY TAKEDOWN WITH LYSIS OF ADHESIONS.;  Surgeon: Karie Soda, MD;  [facility];  Service: General;  Laterality: N/A;  ERAS PATHWAY    Current Medications: No outpatient medications have been marked as taking for the 02/09/23 encounter (Appointment) with Sande Rives, MD.     Allergies:    Penicillins, Erythromycin, Tape, Trimethoprim, Ultram [tramadol], Zarxio [filgrastim], Cephalosporins, Fluconazole, Neomycin, Oxycodone, Pectin, Septra [sulfamethoxazole-trimethoprim], and Sulfa antibiotics   Social History: Social History   Socioeconomic History   Marital status: Married    Spouse name: Clinical research associate   Number of children: 1   Years of education: Not on file   Highest education level: Not on file  Occupational History   Occupation: retired Charity fundraiser from [employer]    Comment: L&D Charity fundraiser - retired  Tobacco Use   Smoking status: Never    Passive exposure: Never   Smokeless tobacco: Never  Vaping Use   Vaping Use: Never used  Substance and Sexual Activity   Alcohol use: Yes    Comment: Occ   Drug use: No   Sexual activity: Not Currently  Other Topics Concern   Not on file  Social History Narrative   Exercise-- has not gotten back into it since cancer came back   Social Determinants of Health   Financial Resource Strain: Not on file  Food Insecurity: Not on file  Transportation Needs: Not on file  Physical Activity: Not on file  Stress: Not on file  Social Connections: Not on file     Family History: The patient's family history includes Brain cancer in her paternal grandfather; Breast cancer in her maternal aunt; Cancer (age of onset: 37) in her mother; Cancer (age of onset: 13) in her father; Diabetes in her brother, father, and sister; Heart disease in her brother and father; Hyperlipidemia in her father; Hypertension in her father and mother; Hypertension (age of onset: y-10) in her brother; Lymphoma in her paternal aunt; Obesity in her father; Ovarian cancer in an other family member.  ROS:   All other ROS reviewed and negative. Pertinent positives noted in the HPI.     EKGs/Labs/Other Studies Reviewed:   The following studies were personally reviewed by me today:  EKG:  EKG is *** ordered today.        Recent Labs: 08/19/2022: ALT  26; BUN 35; Creatinine, Ser 2.42; Potassium 4.3; Sodium 138; TSH 3.60 10/04/2022: Hemoglobin 12.2; Platelets 186   Recent Lipid Panel    Component Value Date/Time   CHOL 66 08/19/2022 1010   CHOL 92 (L) 06/16/2020 0834   TRIG 99.0 08/19/2022 1010   HDL 36.40 (L) 08/19/2022 1010   HDL 49 06/16/2020 0834   CHOLHDL 2 08/19/2022 1010   VLDL 19.8  08/19/2022 1010   LDLCALC 10 08/19/2022 1010   LDLCALC 21 06/16/2020 0834   LDLCALC 13 01/18/2020 1440    Physical Exam:   VS:  There were no vitals taken for this visit.   Wt Readings from Last 3 Encounters:  12/30/22 207 lb (93.9 kg)  12/07/22 203 lb (92.1 kg)  11/25/22 203 lb (92.1 kg)    General: Well nourished, well developed, in no acute distress Head: Atraumatic, normal size  Eyes: PEERLA, EOMI  Neck: Supple, no JVD Endocrine: No thryomegaly Cardiac: Normal S1, S2; RRR; no murmurs, rubs, or gallops Lungs: Clear to auscultation bilaterally, no wheezing, rhonchi or rales  Abd: Soft, nontender, no hepatomegaly  Ext: No edema, pulses 2+ Musculoskeletal: No deformities, BUE and BLE strength normal and equal Skin: Warm and dry, no rashes   Neuro: Alert and oriented to person, place, time, and situation, CNII-XII grossly intact, no focal deficits  Psych: Normal mood and affect   ASSESSMENT:   [name] is a 67 y.o. female who presents for the following: No diagnosis found.  PLAN:   There are no diagnoses linked to this encounter.  {Are you ordering a CV Procedure (e.g. stress test, cath, DCCV, TEE, etc)?   Press F2        :[id number]}  Disposition: No follow-ups on file.  Medication Adjustments/Labs and Tests Ordered: Current medicines are reviewed at length with the patient today.  Concerns regarding medicines are outlined above.  No orders of the defined types were placed in this encounter.  No orders of the defined types were placed in this encounter.  There are no Patient Instructions on file for this visit.    Time Spent with Patient: I have spent a total of *** minutes with patient reviewing hospital notes, telemetry, EKGs, labs and examining the patient as well as establishing an assessment and plan that was discussed with the patient.  > 50% of time was spent in direct patient care.  Signed, [name]. Flora Lipps, MD, [facility]  [facility]  [address] [place], [place] [address] [phone]  02/04/2023 11:10 AM

## 2023-02-07 ENCOUNTER — Other Ambulatory Visit (INDEPENDENT_AMBULATORY_CARE_PROVIDER_SITE_OTHER): Payer: Self-pay | Admitting: Family Medicine

## 2023-02-07 DIAGNOSIS — F3289 Other specified depressive episodes: Secondary | ICD-10-CM

## 2023-02-08 ENCOUNTER — Ambulatory Visit (INDEPENDENT_AMBULATORY_CARE_PROVIDER_SITE_OTHER): Payer: BC Managed Care – PPO | Admitting: Family Medicine

## 2023-02-08 ENCOUNTER — Encounter (INDEPENDENT_AMBULATORY_CARE_PROVIDER_SITE_OTHER): Payer: Self-pay | Admitting: Family Medicine

## 2023-02-08 VITALS — BP 100/66 | HR 64 | Temp 98.4°F | Ht 61.0 in | Wt 196.0 lb

## 2023-02-08 DIAGNOSIS — Z6837 Body mass index (BMI) 37.0-37.9, adult: Secondary | ICD-10-CM

## 2023-02-08 DIAGNOSIS — E669 Obesity, unspecified: Secondary | ICD-10-CM

## 2023-02-08 DIAGNOSIS — R4689 Other symptoms and signs involving appearance and behavior: Secondary | ICD-10-CM

## 2023-02-08 DIAGNOSIS — F3289 Other specified depressive episodes: Secondary | ICD-10-CM

## 2023-02-08 DIAGNOSIS — E1169 Type 2 diabetes mellitus with other specified complication: Secondary | ICD-10-CM | POA: Diagnosis not present

## 2023-02-08 DIAGNOSIS — Z7985 Long-term (current) use of injectable non-insulin antidiabetic drugs: Secondary | ICD-10-CM

## 2023-02-08 MED ORDER — BUPROPION HCL 100 MG PO TABS
100.0000 mg | ORAL_TABLET | Freq: Two times a day (BID) | ORAL | 0 refills | Status: DC
Start: 2023-02-08 — End: 2023-03-08

## 2023-02-08 NOTE — Progress Notes (Unsigned)
Chief Complaint:   OBESITY [name] is here to discuss her progress with her obesity treatment plan along with follow-up of her obesity related diagnoses. [name] is on keeping a food journal and adhering to recommended goals of 1300 calories and 80+ grams of protein and states she is following her eating plan approximately 50% of the time. [name] states she was doing a lot of walking while on a cruise 5 days.   Today's visit was #: 69 Starting weight: 208 lbs Starting date: 04/05/2018 Today's weight: 196 lbs Today's date: 02/08/2023 Total lbs lost to date: 12 Total lbs lost since last in-office visit: 11  Interim History: Patient was on vacation in [place]. She got sick with a upper respiratory infection and GI upset. She is feeling better and she is able to eat and drink more now.   Subjective:   1. Type 2 diabetes mellitus with other specified complication, unspecified whether long term insulin use (HCC) Patient is on Mounjaro but she has significant GI upset. Her fasting blood sugars range between 50-150's most days. She is not on other diabetes mellitus medications.   2. Emotional Eating Behavior Patient is stable on her medications and she is doing well with decreasing emotional eating behaviors. This may be more of an issue when off Mounjaro.   Assessment/Plan:   1. Type 2 diabetes mellitus with other specified complication, unspecified whether long term insulin use (HCC) Patient agreed to discontinue Mounjaro and she will work on her diet control. We will follow glucose levels closely.   2. Emotional Eating Behavior Patient will continue Wellbutrin 100 mg BID, and we will refill for 1 month.   - buPROPion (WELLBUTRIN) 100 MG tablet; Take 1 tablet (100 mg total) by mouth 2 (two) times daily.  Dispense: 60 tablet; Refill: 0  3. BMI 37.0-37.9, adult  4. Obesity, Beginning BMI 38.04 [name] is currently in the action stage of change. As such, her goal is to continue with weight loss  efforts. She has agreed to keeping a food journal and adhering to recommended goals of 1300 calories and 80+ grams of protein daily.   Exercise goals: As is.   Behavioral modification strategies: increasing lean protein intake.  Mehak has agreed to follow-up with our clinic in 4 weeks. She was informed of the importance of frequent follow-up visits to maximize her success with intensive lifestyle modifications for her multiple health conditions.   Objective:   Blood pressure 100/66, pulse 64, temperature 98.4 F (36.9 C), height 5\' 1"  (1.549 m), weight 196 lb (88.9 kg), SpO2 97 %. Body mass index is 37.03 kg/m.  Lab Results  Component Value Date   CREATININE 2.42 (H) 08/19/2022   BUN 35 (H) 08/19/2022   NA 138 08/19/2022   K 4.3 08/19/2022   CL 105 08/19/2022   CO2 24 08/19/2022   Lab Results  Component Value Date   ALT 26 08/19/2022   AST 18 08/19/2022   ALKPHOS 194 (H) 08/19/2022   BILITOT 0.3 08/19/2022   Lab Results  Component Value Date   HGBA1C 6.1 02/15/2022   HGBA1C 6.0 07/23/2021   HGBA1C 6.3 (H) 06/16/2020   HGBA1C 7.9 (H) 09/01/2017   HGBA1C 5.3 04/01/2017   Lab Results  Component Value Date   INSULIN 14.1 06/16/2020   Lab Results  Component Value Date   TSH 3.60 08/19/2022   Lab Results  Component Value Date   CHOL 66 08/19/2022   HDL 36.40 (L) 08/19/2022   LDLCALC  10 08/19/2022   TRIG 99.0 08/19/2022   CHOLHDL 2 08/19/2022   Lab Results  Component Value Date   VD25OH 31.09 02/15/2022   VD25OH 25.9 (L) 06/16/2020   VD25OH 40.8 04/05/2018   Lab Results  Component Value Date   WBC 20.4 (H) 10/04/2022   HGB 12.2 10/04/2022   HCT 37.9 10/04/2022   MCV 94.3 10/04/2022   PLT 186 10/04/2022   Lab Results  Component Value Date   IRON 7 (L) 08/31/2017   TIBC 164 (L) 08/31/2017   FERRITIN 27 11/29/2014   Attestation Statements:   Reviewed by clinician on day of visit: allergies, medications, problem list, medical history, surgical  history, family history, social history, and previous encounter notes.  I have personally spent 40 minutes total time today in preparation, patient care, and documentation for this visit, including the following: review of clinical lab tests; review of medical tests/procedures/services.  I, Burt Knack, am acting as transcriptionist for Quillian Quince, MD.  I have reviewed the above documentation for accuracy and completeness, and I agree with the above. - Quillian Quince, MD

## 2023-02-09 ENCOUNTER — Ambulatory Visit: Payer: BC Managed Care – PPO | Attending: Cardiology | Admitting: Cardiovascular Disease

## 2023-02-09 ENCOUNTER — Encounter: Payer: Self-pay | Admitting: Cardiovascular Disease

## 2023-02-09 VITALS — BP 124/76 | HR 77 | Ht 61.0 in | Wt 201.8 lb

## 2023-02-09 DIAGNOSIS — I480 Paroxysmal atrial fibrillation: Secondary | ICD-10-CM | POA: Diagnosis not present

## 2023-02-09 DIAGNOSIS — E785 Hyperlipidemia, unspecified: Secondary | ICD-10-CM | POA: Diagnosis not present

## 2023-02-09 NOTE — Patient Instructions (Signed)
Medication Instructions:  NO CHANGES  *If you need a refill on your cardiac medications before your next appointment, please call your pharmacy*   Follow-Up: At [facility], you and your health needs are our priority.  As part of our continuing mission to provide you with exceptional heart care, we have created designated Provider Care Teams.  These Care Teams include your primary Cardiologist (physician) and Advanced Practice Providers (APPs -  Physician Assistants and Nurse Practitioners) who all work together to provide you with the care you need, when you need it.  We recommend signing up for the patient portal called "MyChart".  Sign up information is provided on this After Visit Summary.  MyChart is used to connect with patients for Virtual Visits (Telemedicine).  Patients are able to view lab/test results, encounter notes, upcoming appointments, etc.  Non-urgent messages can be sent to your provider as well.   To learn more about what you can do with MyChart, go to [email or web address].    Your next appointment:   12 months with Dr. O'Neal 

## 2023-02-17 ENCOUNTER — Encounter: Payer: Self-pay | Admitting: Family Medicine

## 2023-02-17 ENCOUNTER — Ambulatory Visit: Payer: BC Managed Care – PPO | Admitting: Family Medicine

## 2023-02-17 VITALS — BP 130/80 | HR 62 | Temp 98.2°F | Resp 18 | Ht 61.0 in | Wt 203.4 lb

## 2023-02-17 DIAGNOSIS — E785 Hyperlipidemia, unspecified: Secondary | ICD-10-CM

## 2023-02-17 DIAGNOSIS — H109 Unspecified conjunctivitis: Secondary | ICD-10-CM

## 2023-02-17 DIAGNOSIS — N390 Urinary tract infection, site not specified: Secondary | ICD-10-CM

## 2023-02-17 DIAGNOSIS — E039 Hypothyroidism, unspecified: Secondary | ICD-10-CM | POA: Diagnosis not present

## 2023-02-17 DIAGNOSIS — C569 Malignant neoplasm of unspecified ovary: Secondary | ICD-10-CM

## 2023-02-17 DIAGNOSIS — R3129 Other microscopic hematuria: Secondary | ICD-10-CM | POA: Diagnosis not present

## 2023-02-17 DIAGNOSIS — E1169 Type 2 diabetes mellitus with other specified complication: Secondary | ICD-10-CM | POA: Diagnosis not present

## 2023-02-17 DIAGNOSIS — Z933 Colostomy status: Secondary | ICD-10-CM

## 2023-02-17 DIAGNOSIS — N1832 Chronic kidney disease, stage 3b: Secondary | ICD-10-CM

## 2023-02-17 DIAGNOSIS — I10 Essential (primary) hypertension: Secondary | ICD-10-CM

## 2023-02-17 DIAGNOSIS — I480 Paroxysmal atrial fibrillation: Secondary | ICD-10-CM

## 2023-02-17 DIAGNOSIS — E559 Vitamin D deficiency, unspecified: Secondary | ICD-10-CM | POA: Diagnosis not present

## 2023-02-17 LAB — TSH: TSH: 2.27 u[IU]/mL (ref 0.35–5.50)

## 2023-02-17 LAB — CBC WITH DIFFERENTIAL/PLATELET
Basophils Absolute: 0 10*3/uL (ref 0.0–0.1)
Basophils Relative: 0.7 % (ref 0.0–3.0)
Eosinophils Absolute: 0.1 10*3/uL (ref 0.0–0.7)
Eosinophils Relative: 1.3 % (ref 0.0–5.0)
HCT: 36.8 % (ref 36.0–46.0)
Hemoglobin: 11.7 g/dL — ABNORMAL LOW (ref 12.0–15.0)
Lymphocytes Relative: 13.5 % (ref 12.0–46.0)
Lymphs Abs: 0.9 10*3/uL (ref 0.7–4.0)
MCHC: 31.9 g/dL (ref 30.0–36.0)
MCV: 91.4 fl (ref 78.0–100.0)
Monocytes Absolute: 0.4 10*3/uL (ref 0.1–1.0)
Monocytes Relative: 6 % (ref 3.0–12.0)
Neutro Abs: 5.2 10*3/uL (ref 1.4–7.7)
Neutrophils Relative %: 78.5 % — ABNORMAL HIGH (ref 43.0–77.0)
Platelets: 242 10*3/uL (ref 150.0–400.0)
RBC: 4.02 Mil/uL (ref 3.87–5.11)
RDW: 14.4 % (ref 11.5–15.5)
WBC: 6.6 10*3/uL (ref 4.0–10.5)

## 2023-02-17 LAB — POC URINALSYSI DIPSTICK (AUTOMATED)
Glucose, UA: NEGATIVE
Nitrite, UA: POSITIVE
Protein, UA: POSITIVE — AB
Spec Grav, UA: 1.015 (ref 1.010–1.025)
Urobilinogen, UA: 0.2 E.U./dL
pH, UA: 5 (ref 5.0–8.0)

## 2023-02-17 LAB — COMPREHENSIVE METABOLIC PANEL
ALT: 33 U/L (ref 0–35)
AST: 16 U/L (ref 0–37)
Albumin: 3.7 g/dL (ref 3.5–5.2)
Alkaline Phosphatase: 200 U/L — ABNORMAL HIGH (ref 39–117)
BUN: 30 mg/dL — ABNORMAL HIGH (ref 6–23)
CO2: 25 mEq/L (ref 19–32)
Calcium: 9.2 mg/dL (ref 8.4–10.5)
Chloride: 105 mEq/L (ref 96–112)
Creatinine, Ser: 1.84 mg/dL — ABNORMAL HIGH (ref 0.40–1.20)
GFR: 27.85 mL/min — ABNORMAL LOW (ref >60.00)
Glucose, Bld: 123 mg/dL — ABNORMAL HIGH (ref 70–99)
Potassium: 4.1 mEq/L (ref 3.5–5.1)
Sodium: 138 mEq/L (ref 135–145)
Total Bilirubin: 0.4 mg/dL (ref 0.2–1.2)
Total Protein: 7.3 g/dL (ref 6.0–8.3)

## 2023-02-17 LAB — VITAMIN D 25 HYDROXY (VIT D DEFICIENCY, FRACTURES): VITD: 36.4 ng/mL (ref 30.00–100.00)

## 2023-02-17 LAB — LIPID PANEL
Cholesterol: 77 mg/dL (ref 0–200)
HDL: 39.2 mg/dL (ref >39.00)
LDL Cholesterol: 14 mg/dL (ref 0–99)
NonHDL: 37.82
Total CHOL/HDL Ratio: 2
Triglycerides: 118 mg/dL (ref 0.0–149.0)
VLDL: 23.6 mg/dL (ref 0.0–40.0)

## 2023-02-17 MED ORDER — MOXIFLOXACIN HCL 0.5 % OP SOLN
1.0000 [drp] | Freq: Three times a day (TID) | OPHTHALMIC | 0 refills | Status: AC
Start: 2023-02-17 — End: ?

## 2023-02-17 MED ORDER — DOXYCYCLINE HYCLATE 100 MG PO TABS
100.0000 mg | ORAL_TABLET | Freq: Two times a day (BID) | ORAL | 0 refills | Status: DC
Start: 2023-02-17 — End: 2023-04-06

## 2023-02-17 MED ORDER — CEFPODOXIME PROXETIL 200 MG PO TABS
200.0000 mg | ORAL_TABLET | Freq: Two times a day (BID) | ORAL | 0 refills | Status: DC
Start: 2023-02-17 — End: 2023-04-06

## 2023-02-17 NOTE — Patient Instructions (Signed)
Preventive Care 65 Years and Older, Female Preventive care refers to lifestyle choices and visits with your health care provider that can promote health and wellness. Preventive care visits are also called wellness exams. What can I expect for my preventive care visit? Counseling Your health care provider may ask you questions about your: Medical history, including: Past medical problems. Family medical history. Pregnancy and menstrual history. History of falls. Current health, including: Memory and ability to understand (cognition). Emotional well-being. Home life and relationship well-being. Sexual activity and sexual health. Lifestyle, including: Alcohol, nicotine or tobacco, and drug use. Access to firearms. Diet, exercise, and sleep habits. Work and work environment. Sunscreen use. Safety issues such as seatbelt and bike helmet use. Physical exam Your health care provider will check your: Height and weight. These may be used to calculate your BMI (body mass index). BMI is a measurement that tells if you are at a healthy weight. Waist circumference. This measures the distance around your waistline. This measurement also tells if you are at a healthy weight and may help predict your risk of certain diseases, such as type 2 diabetes and high blood pressure. Heart rate and blood pressure. Body temperature. Skin for abnormal spots. What immunizations do I need?  Vaccines are usually given at various ages, according to a schedule. Your health care provider will recommend vaccines for you based on your age, medical history, and lifestyle or other factors, such as travel or where you work. What tests do I need? Screening Your health care provider may recommend screening tests for certain conditions. This may include: Lipid and cholesterol levels. Hepatitis C test. Hepatitis B test. HIV (human immunodeficiency virus) test. STI (sexually transmitted infection) testing, if you are at  risk. Lung cancer screening. Colorectal cancer screening. Diabetes screening. This is done by checking your blood sugar (glucose) after you have not eaten for a while (fasting). Mammogram. Talk with your health care provider about how often you should have regular mammograms. BRCA-related cancer screening. This may be done if you have a family history of breast, ovarian, tubal, or peritoneal cancers. Bone density scan. This is done to screen for osteoporosis. Talk with your health care provider about your test results, treatment options, and if necessary, the need for more tests. Follow these instructions at home: Eating and drinking  Eat a diet that includes fresh fruits and vegetables, whole grains, lean protein, and low-fat dairy products. Limit your intake of foods with high amounts of sugar, saturated fats, and salt. Take vitamin and mineral supplements as recommended by your health care provider. Do not drink alcohol if your health care provider tells you not to drink. If you drink alcohol: Limit how much you have to 0-1 drink a day. Know how much alcohol is in your drink. In the [place] one drink equals one 12 oz bottle of beer (355 mL), one 5 oz glass of wine (148 mL), or one 1 oz glass of hard liquor (44 mL). Lifestyle Brush your teeth every morning and night with fluoride toothpaste. Floss one time each day. Exercise for at least 30 minutes 5 or more days each week. Do not use any products that contain nicotine or tobacco. These products include cigarettes, chewing tobacco, and vaping devices, such as e-cigarettes. If you need help quitting, ask your health care provider. Do not use drugs. If you are sexually active, practice safe sex. Use a condom or other form of protection in order to prevent STIs. Take aspirin only as told by   your health care provider. Make sure that you understand how much to take and what form to take. Work with your health care provider to find out whether it  is safe and beneficial for you to take aspirin daily. Ask your health care provider if you need to take a cholesterol-lowering medicine (statin). Find healthy ways to manage stress, such as: Meditation, yoga, or listening to music. Journaling. Talking to a trusted person. Spending time with friends and family. Minimize exposure to UV radiation to reduce your risk of skin cancer. Safety Always wear your seat belt while driving or riding in a vehicle. Do not drive: If you have been drinking alcohol. Do not ride with someone who has been drinking. When you are tired or distracted. While texting. If you have been using any mind-altering substances or drugs. Wear a helmet and other protective equipment during sports activities. If you have firearms in your house, make sure you follow all gun safety procedures. What's next? Visit your health care provider once a year for an annual wellness visit. Ask your health care provider how often you should have your eyes and teeth checked. Stay up to date on all vaccines. This information is not intended to replace advice given to you by your health care provider. Make sure you discuss any questions you have with your health care provider. Document Revised: 01/14/2021 Document Reviewed: 01/14/2021 [employer] Patient Education  2024 [employer]  

## 2023-02-17 NOTE — Progress Notes (Signed)
Established Patient Office Visit  Subjective   Patient ID: [name], female    DOB: 1955-12-15  Age: 67 y.o. MRN: [id number]  Chief Complaint  Patient presents with   Annual Exam    Pt states fasting     HPI Discussed the use of AI scribe software for clinical note transcription with the patient, who gave verbal consent to proceed.  History of Present Illness   The patient, with a history of diabetes and cardiovascular disease, presents with multiple complaints. The primary concern is a suspected urinary tract infection, characterized by a burning sensation and significant drainage. The patient also suspects a recurrent fistula infection, evidenced by a burning sensation and bleeding from the rectum. The patient reports significant discomfort and frustration due to these symptoms, which have been affecting her quality of life.  In addition to these issues, the patient also reports a recent respiratory infection, which she contracted during a cruise trip. Despite testing negative for COVID-19, the patient continues to experience hoarseness and general malaise. The patient also mentions difficulty with her diabetes medication, Mounjaro, which she believes is causing a blockage.  The patient also expresses concern about potential transmission of her infections, particularly in shared spaces like bathrooms. She reports taking extensive measures to clean and disinfect her surroundings to prevent the spread of infection.      Patient Active Problem List   Diagnosis Date Noted   BMI 39.0-39.9,adult 12/30/2022   Colovaginal fistula 12/13/2022   BMI 38.0-38.9,adult 11/25/2022   Abdominal pain, RLQ 10/28/2022   Chronic renal impairment, stage 3b (HCC) 09/30/2022   Presence of urostomy (HCC) 09/04/2022   Serratia 09/04/2022   BMI 40.0-44.9, adult (HCC) 09/02/2022   Obesity, Beginning BMI 38.04 09/02/2022   Dysuria 08/19/2022   Type 2 diabetes mellitus with other specified  complication (HCC) 03/22/2022   Type 2 diabetes mellitus with stage 4 chronic kidney disease, without long-term current use of insulin (HCC) 12/10/2021   Vaginal bleeding 12/10/2021   Vulvar lesion 08/18/2021   DM (diabetes mellitus) type II, controlled, with peripheral vascular disorder (HCC) 01/20/2021   First degree heart block 01/18/2020   Paroxysmal atrial fibrillation (HCC) 01/18/2020   Swelling of right parotid gland 10/03/2019   Vesicorectal fistula 02/26/2019   Chronic pain    Depression 01/16/2019   Other chronic postprocedural pain 05/25/2018   Recurrent UTI (urinary tract infection) 03/08/2018   HLD (hyperlipidemia) 11/29/2017   GERD (gastroesophageal reflux disease) 11/29/2017   Type II diabetes mellitus with renal manifestations (HCC) 11/29/2017   Preventative health care    Poor venous access    Acute renal failure superimposed on chronic kidney disease (HCC) 10/02/2017   Decubitus ulcer of sacral region, stage 2 (HCC) 10/02/2017   MDD (major depressive disorder), single episode, moderate (HCC)    Poorly controlled diabetes mellitus (HCC) 08/31/2017   Pressure injury of skin 08/31/2017   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 08/31/2017   History of external beam radiation therapy to pelvis 08/31/2017   ARF (acute renal failure) (HCC) 08/30/2017   Adjustment disorder with mixed anxiety and depressed mood 08/30/2017   Dehydration 08/29/2017   CKD (chronic kidney disease), stage III (HCC) 08/24/2017   Wound of right buttock 08/10/2017   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HCC) 06/20/2017   Postoperative anemia 04/04/2017   Pelvic cancer s/p colostomy takedown/loop ileostomy diversion 04/01/2017 04/01/2017   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 04/01/2017   Ureteral stricture, right, 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) 04/01/2017 09/10/2016  Vitamin D deficiency 08/30/2016   Genetic testing 11/27/2015   Breast cancer of upper-inner quadrant of left female  breast (HCC)    Pelvic pain in female 03/19/2015   Chronic anemia 12/30/2014   UTI (urinary tract infection)    Pancytopenia, acquired (HCC) 11/26/2014   Class 2 severe obesity with serious comorbidity and body mass index (BMI) of 38.0 to 38.9 in adult [facility]) 11/20/2014   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 s/p resection/partial vaginectomy/ LAR/colostomy 11/19/2014 11/19/2014   Abdominal pain 09/09/2014   Postmenopausal bleeding 09/05/2014   History of ovarian & endometrial cancer 07/17/2012   Chronic neutropenia (HCC) 12/14/2011   Hypothyroidism 12/14/2011   DM type 2 (diabetes mellitus, type 2) (HCC) 12/14/2011   Benign essential HTN 12/14/2011   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(TAH w/ BSO) :  recurrent 11-19-2014 post pelvic surgery and radiation 01-29-2015 to 03-10-2015   Chronic idiopathic neutropenia (HCC)    presumed related to chemotherapy March 2006--- followed by dr Bertis Ruddy (treatment w/ G-CSF injections   Chronic nausea    CKD (chronic kidney disease), stage III [facility])    nephrologist-  dr Bufford Buttner   Difficult intravenous access    small veins--- hx PICC lines   DM type 2 (diabetes mellitus, type 2) (HCC)    endocriologist--  dr Joselyn Glassman Janee Morn   (08-18-2021  per pt checks blood sugar daily in am,  fasting sugar--92--130)   Dysrhythmia    Environmental and seasonal allergies    Generalized muscle weakness    GERD (gastroesophageal reflux  disease)    Hiatal hernia    History of abdominal abscess 04/16/2017   post surgery 04-01-2017  --- resolved 10/ 2018   History of COVID-19 05/2021   per pt mild symptoms that resolved   History of gastric polyp    2014  duodenum   History of ileus 04/16/2017   resolved w/ no surgical intervention   History of radiation therapy    01-29-2015 to 03-10-2015  pelvis 50.4Gy   History of small bowel obstruction 01/2019   Hypothyroidism    monitored by dr Casimiro Needle altheimer   Ileostomy in place [facility]) 04/01/2017   created at same time colostomy takedown./  permnant 09/ 2020   Mixed dyslipidemia    Multiple thyroid nodules    Managed by Dr. Gerrit Friends   PAF (paroxysmal atrial fibrillation) [facility])    cardiologist--- dr h. 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   Vitamin D deficiency    Vulval lesion    Wears glasses    Past Surgical History:  Procedure Laterality Date  APPENDECTOMY     BREAST RECONSTRUCTION WITH PLACEMENT OF TISSUE EXPANDER AND FLEX HD (ACELLULAR HYDRATED DERMIS) Bilateral 12/11/2015   Procedure: BILATERAL BREAST RECONSTRUCTION WITH PLACEMENT OF TISSUE EXPANDERS;  Surgeon: Glenna Fellows, MD;  [facility];  Service: Plastics;  Laterality: Bilateral;   COLONOSCOPY WITH PROPOFOL N/A 08/21/2013   Procedure: COLONOSCOPY WITH PROPOFOL;  Surgeon: Florencia Reasons, MD;  [facility];  Service: Endoscopy;  Laterality: N/A;   COLOSTOMY TAKEDOWN N/A 12/04/2014   Procedure: LAPROSCOPIC LYSIS OF ADHESIONS, SPLENIC MOBILIZATION, RELOCATION OF COLOSTOMY, DEBRIDEMENT INITIAL COLOSTOMY SITE;  Surgeon: Karie Soda, MD;  [facility];   Service: General;  Laterality: N/A;   CYSTECTOMY W/ URETEROILEAL CONDUIT  04/12/2019   @[facility] ;   INTESTINAL ANASTOMOSIS, PANNICULECTOMY, PELVIS EXENTENTION,  PARTIAL COLECTOMY REMOVAL ILEIUM WITH PERMNANT ILEOCOLOSTOMY   CYSTOGRAM N/A 06/01/2017   Procedure: CYSTOGRAM;  Surgeon: Sebastian Ache, MD;  [facility];  Service: Urology;  Laterality: N/A;   CYSTOSCOPY W/ RETROGRADES Right 11/21/2015   Procedure: CYSTOSCOPY WITH RETROGRADE PYELOGRAM;  Surgeon: Sebastian Ache, MD;  [facility];  Service: Urology;  Laterality: Right;   CYSTOSCOPY W/ URETERAL STENT PLACEMENT Right 11/21/2015   Procedure: CYSTOSCOPY WITH STENT REPLACEMENT;  Surgeon: Sebastian Ache, MD;  [facility];  Service: Urology;  Laterality: Right;   CYSTOSCOPY W/ URETERAL STENT PLACEMENT Right 03/10/2016   Procedure: CYSTOSCOPY WITH STENT REPLACEMENT;  Surgeon: Sebastian Ache, MD;  Location: [facility];  Service: Urology;  Laterality: Right;   CYSTOSCOPY W/ URETERAL STENT PLACEMENT Right 06/30/2016   Procedure: CYSTOSCOPY WITH RETROGRADE PYELOGRAM/URETERAL STENT EXCHANGE;  Surgeon: Sebastian Ache, MD;  Location: [facility];  Service: Urology;  Laterality: Right;   CYSTOSCOPY W/ URETERAL STENT PLACEMENT N/A 06/01/2017   Procedure: CYSTOSCOPY WITH EXAM UNDER ANESTHESIA;  Surgeon: Sebastian Ache, MD;  [facility];  Service: Urology;  Laterality: N/A;   CYSTOSCOPY W/ URETERAL STENT PLACEMENT Right 08/17/2017   Procedure: CYSTOSCOPY WITH RETROGRADE PYELOGRAM/URETERAL STENT REMOVAL;  Surgeon: Sebastian Ache, MD;  Location[facility];  Service: Urology;  Laterality: Right;   CYSTOSCOPY WITH RETROGRADE PYELOGRAM, URETEROSCOPY AND STENT PLACEMENT Right 03/20/2015   Procedure: CYSTOSCOPY WITH RETROGRADE PYELOGRAM, URETEROSCOPY WITH BALLOON DILATION AND STENT PLACEMENT ON RIGHT;  Surgeon: Sebastian Ache, MD;  Location: [facility];  Service: Urology;  Laterality:  Right;   CYSTOSCOPY WITH RETROGRADE PYELOGRAM, URETEROSCOPY AND STENT PLACEMENT Right 05/02/2015   Procedure: CYSTOSCOPY WITH RIGHT RETROGRADE PYELOGRAM,  DIAGNOSTIC URETEROSCOPY AND STENT PULL ;  Surgeon: Sebastian Ache, MD;  Location: [facility];  Service: Urology;  Laterality: Right;   CYSTOSCOPY WITH RETROGRADE PYELOGRAM, URETEROSCOPY AND STENT PLACEMENT Right 09/05/2015   Procedure: CYSTOSCOPY WITH RETROGRADE PYELOGRAM,  AND STENT PLACEMENT;  Surgeon: Sebastian Ache, MD;  [facility];  Service: Urology;  Laterality: Right;   CYSTOSCOPY WITH RETROGRADE PYELOGRAM, URETEROSCOPY AND STENT PLACEMENT Right 04/01/2017   Procedure: CYSTOSCOPY WITH RETROGRADE PYELOGRAM, URETEROSCOPY AND STENT PLACEMENT;  Surgeon: Sebastian Ache, MD;  [facility];  Service: Urology;  Laterality: Right;   CYSTOSCOPY WITH STENT PLACEMENT Right 10/27/2016   Procedure: CYSTOSCOPY WITH STENT CHANGE and right retrograde pyelogram;  Surgeon: Sebastian Ache, MD;  Location: [facility];  Service: Urology;  Laterality: Right;   EUS N/A 10/02/2014   Procedure: LOWER ENDOSCOPIC ULTRASOUND (EUS);  Surgeon: Willis Modena, MD;  Location: [facility];  Service: Endoscopy;  Laterality: N/A;   EXCISION SOFT TISSUE MASS RIGHT FOREMAN  12/08/2006   EYE  SURGERY  as child   pytosis of eyelids repair   INCISION AND DRAINAGE OF WOUND Bilateral 12/26/2015   Procedure: DEBRIDEMENT OF BILATERAL MASTECTOMY FLAPS;  Surgeon: Glenna Fellows, MD;  Location: [facility];  Service: Plastics;  Laterality: Bilateral;   IR CV LINE INJECTION  05/31/2017   IR FLUORO GUIDE CV LINE LEFT  05/31/2017   IR FLUORO GUIDE CV LINE RIGHT  04/06/2017   IR FLUORO GUIDE CV MIDLINE PICC RIGHT  05/30/2017   IR NEPHROSTOGRAM LEFT INITIAL PLACEMENT  09/02/2017   IR NEPHROSTOGRAM LEFT THRU EXISTING ACCESS  11/29/2017   IR NEPHROSTOGRAM RIGHT INITIAL PLACEMENT  09/02/2017   IR NEPHROSTOGRAM RIGHT THRU EXISTING  ACCESS  09/13/2017   IR NEPHROSTOGRAM RIGHT THRU EXISTING ACCESS  11/29/2017   IR NEPHROSTOMY EXCHANGE LEFT  11/28/2017   IR NEPHROSTOMY EXCHANGE LEFT  01/05/2018   IR NEPHROSTOMY EXCHANGE LEFT  02/16/2018   IR NEPHROSTOMY EXCHANGE LEFT  03/30/2018   IR NEPHROSTOMY EXCHANGE LEFT  05/12/2018   IR NEPHROSTOMY EXCHANGE LEFT  06/21/2018   IR NEPHROSTOMY EXCHANGE LEFT  08/04/2018   IR NEPHROSTOMY EXCHANGE LEFT  09/18/2018   IR NEPHROSTOMY EXCHANGE LEFT  10/09/2018   IR NEPHROSTOMY EXCHANGE LEFT  10/27/2018   IR NEPHROSTOMY EXCHANGE LEFT  11/21/2018   IR NEPHROSTOMY EXCHANGE LEFT  01/05/2019   IR NEPHROSTOMY EXCHANGE LEFT  02/15/2019   IR NEPHROSTOMY EXCHANGE LEFT  03/29/2019   IR NEPHROSTOMY EXCHANGE RIGHT  10/02/2017   IR NEPHROSTOMY EXCHANGE RIGHT  11/28/2017   IR NEPHROSTOMY EXCHANGE RIGHT  01/05/2018   IR NEPHROSTOMY EXCHANGE RIGHT  02/16/2018   IR NEPHROSTOMY EXCHANGE RIGHT  03/30/2018   IR NEPHROSTOMY EXCHANGE RIGHT  05/12/2018   IR NEPHROSTOMY EXCHANGE RIGHT  06/21/2018   IR NEPHROSTOMY EXCHANGE RIGHT  08/04/2018   IR NEPHROSTOMY EXCHANGE RIGHT  09/18/2018   IR NEPHROSTOMY EXCHANGE RIGHT  10/27/2018   IR NEPHROSTOMY EXCHANGE RIGHT  11/21/2018   IR NEPHROSTOMY EXCHANGE RIGHT  01/05/2019   IR NEPHROSTOMY EXCHANGE RIGHT  02/15/2019   IR NEPHROSTOMY EXCHANGE RIGHT  03/29/2019   IR NEPHROSTOMY PLACEMENT LEFT  10/02/2017   IR RADIOLOGIST EVAL & MGMT  05/03/2017   IR US GUIDE VASC ACCESS LEFT  05/31/2017   IR US GUIDE VASC ACCESS RIGHT  04/06/2017   IR US GUIDE VASC ACCESS RIGHT  05/30/2017   LAPAROSCOPIC CHOLECYSTECTOMY  1990   LIPOSUCTION WITH LIPOFILLING Bilateral 04/16/2016   Procedure: LIPOSUCTION WITH LIPOFILLING TO BILATERAL CHEST;  Surgeon: Glenna Fellows, MD;  Location: [facility];  Service: Plastics;  Laterality: Bilateral;   MASTECTOMY W/ SENTINEL NODE BIOPSY Bilateral 12/11/2015   Procedure: RIGHT PROPHYLACTIC MASTECTOMY, LEFT TOTAL MASTECTOMY WITH LEFT  AXILLARY SENTINEL LYMPH NODE BIOPSY;  Surgeon: Almond Lint, MD;  [facility];  Service: General;  Laterality: Bilateral;   OSTOMY N/A 11/19/2014   Procedure: OSTOMY;  Surgeon: Karie Soda, MD;  [facility];  Service: General;  Laterality: N/A;   PROCTOSCOPY N/A 04/01/2017   Procedure: RIDGE PROCTOSCOPY;  Surgeon: Karie Soda, MD;  [facility];  Service: General;  Laterality: N/A;   REMOVAL OF BILATERAL TISSUE EXPANDERS WITH PLACEMENT OF BILATERAL BREAST IMPLANTS Bilateral 04/16/2016   Procedure: REMOVAL OF BILATERAL TISSUE EXPANDERS WITH PLACEMENT OF BILATERAL BREAST IMPLANTS;  Surgeon: Glenna Fellows, MD;  Location: [facility];  Service: Plastics;  Laterality: Bilateral;   ROBOTIC ASSISTED LAP VAGINAL HYSTERECTOMY N/A 11/19/2014   Procedure: ROBOTIC LYSIS OF ADHESIONS, CONVERTED TO LAPAROTOMY RADICAL UPPER VAGINECTOMY,LOW  ANTERIOR BOWEL RESECTION, COLOSTOMY, BILATERAL URETERAL STENT PLACEMENT AND CYSTONOMY CLOSURE;  Surgeon: Adolphus Birchwood, MD;  [facility];  Service: Gynecology;  Laterality: N/A;   TISSUE EXPANDER FILLING Bilateral 12/26/2015   Procedure: EXPANSION OF BILATERAL CHEST TISSUE EXPANDERS (60 mL- Right; 75 mL- Left);  Surgeon: Glenna Fellows, MD;  Location: [facility];  Service: Plastics;  Laterality: Bilateral;   TONSILLECTOMY     TOTAL ABDOMINAL HYSTERECTOMY  March 2006   [facility]   and Bilateral Salpingoophorectomy/  staging for Ovarian cancer/  an   VULVAR LESION REMOVAL N/A 08/19/2021   Procedure: VULVAR LESION with biopsies;  Surgeon: Carver Fila, MD;  Location: [facility];  Service: Gynecology;  Laterality: N/A;   XI ROBOTIC ASSISTED LOWER ANTERIOR RESECTION N/A 04/01/2017   Procedure: XI ROBOTIC VS LAPAROSCOPIC COLOSTOMY TAKEDOWN WITH LYSIS OF ADHESIONS.;  Surgeon: Karie Soda, MD;  [facility];  Service: General;  Laterality: N/A;  ERAS PATHWAY   Social History   Tobacco Use   Smoking  status: Never    Passive exposure: Never   Smokeless tobacco: Never  Vaping Use   Vaping status: Never Used  Substance Use Topics   Alcohol use: Yes    Comment: Occ   Drug use: No   Social History   Socioeconomic History   Marital status: Married    Spouse name: Clinical research associate   Number of children: 1   Years of education: Not on file   Highest education level: Not on file  Occupational History   Occupation: retired Charity fundraiser from [employer]    Comment: L&D Charity fundraiser - retired  Tobacco Use   Smoking status: Never    Passive exposure: Never   Smokeless tobacco: Never  Vaping Use   Vaping status: Never Used  Substance and Sexual Activity   Alcohol use: Yes    Comment: Occ   Drug use: No   Sexual activity: Not Currently  Other Topics Concern   Not on file  Social History Narrative   Exercise-- has not gotten back into it since cancer came back   Social Determinants of Health   Financial Resource Strain: Low Risk  (11/21/2017)   Received from [facility], [facility]   Overall Financial Resource Strain (CARDIA)    Difficulty of Paying Living Expenses: Not hard at all  Food Insecurity: No Food Insecurity (11/21/2017)   Received from [facility], [name]   Hunger Vital Sign    Worried About Running Out of Food in the Last Year: Never true    Ran Out of Food in the Last Year: Never true  Transportation Needs: No Transportation Needs (11/21/2017)   Received from [facility], [facility]   PRAPARE - Transportation    Lack of Transportation (Medical): No    Lack of Transportation (Non-Medical): No  Physical Activity: Insufficiently Active (11/21/2017)   Received from [facility], [facility]   Exercise Vital Sign    Days of Exercise per Week: 3 days    Minutes of Exercise per Session: 30 min  Stress: No Stress Concern Present (11/21/2017)   Received  from [facility], [facility]   [employer] of Occupational Health - Occupational Stress Questionnaire    Feeling of Stress : Not at all  Social Connections: Moderately Integrated (11/21/2017)   Received from [facility], [facility]  Social Advertising account executive [NHANES]    Frequency of Communication with Friends and Family: More than three times a week    Frequency of Social Gatherings with Friends and Family: Once a week    Attends Religious Services: More than 4 times per year    Active Member of [employer] or Organizations: No    Attends Engineer, structural: Never    Marital Status: Married  Catering manager Violence: Not on file   Family Status  Relation Name Status   Mother  Deceased   Father  Deceased   Mat Aunt  Deceased   Pat Aunt  Deceased   MGM  Deceased   MGF  Deceased   PGM  Deceased   PGF  Deceased   Mat Aunt  Deceased   Other  Deceased   Sister  (Not Specified)   Brother  (Not Specified)  No partnership data on file   Family History  Problem Relation Age of Onset   Cancer Mother 36       stomach ca   Hypertension Mother    Cancer Father 17       prostate ca   Diabetes Father    Heart disease Father        CABG   Hypertension Father    Hyperlipidemia Father    Obesity Father    Breast cancer Maternal Aunt        dx in her 61s   Lymphoma Paternal Aunt    Brain cancer Paternal Grandfather    Ovarian cancer Other    Diabetes Sister    Hypertension Brother y-10   Heart disease Brother        CABG   Diabetes Brother    Allergies  Allergen Reactions   Penicillins Hives and Swelling    Facial swelling/childhood allergy Has patient had a PCN reaction causing immediate rash, facial/tongue/throat swelling, SOB or lightheadedness with hypotension: Yes Has patient had a PCN reaction causing severe rash involving mucus membranes or skin necrosis: Yes Has patient  had a PCN reaction that required hospitalization yes Has patient had a PCN reaction occurring within the last 10 years: No If all of the above answers are "NO", then may proceed with Cephalosporin use.    Erythromycin Other (See Comments)    Gastritis, abd cramps   Tape Rash    blisters   Trimethoprim Rash   Ultram [Tramadol] Hives   Zarxio [Filgrastim] Other (See Comments)    Post injection, elevated heart rate, body feeling unwell, uneasy.    Cephalosporins Rash   Fluconazole Rash   Neomycin Rash    blisters   Oxycodone Other (See Comments)    " I just feel weird"   Pectin Rash    Pectin ring for stoma   Septra [Sulfamethoxazole-Trimethoprim] Rash   Sulfa Antibiotics Rash      ROS    Objective:     BP 130/80 (BP Location: Left Arm, Patient Position: Sitting, Cuff Size: Normal)   Pulse 62   Temp 98.2 F (36.8 C) (Oral)   Resp 18   Ht 5\' 1"  (1.549 m)   Wt 203 lb 6.4 oz (92.3 kg)   SpO2 98%   BMI 38.43 kg/m  BP Readings from Last 3 Encounters:  02/17/23 130/80  02/09/23 124/76  02/08/23 100/66   Wt Readings from Last 3 Encounters:  02/17/23 203 lb 6.4 oz (92.3 kg)  02/09/23 201 lb 12.8 oz (91.5 kg)  02/08/23 196 lb (88.9 kg)  SpO2 Readings from Last 3 Encounters:  02/17/23 98%  02/09/23 99%  02/08/23 97%      Physical Exam Vitals and nursing note reviewed.  Constitutional:      General: She is not in acute distress.    Appearance: Normal appearance. She is well-developed.  HENT:     Head: Normocephalic and atraumatic.  Eyes:     General: No scleral icterus.       Right eye: Discharge present.        Left eye: Discharge present.    Conjunctiva/sclera:     Right eye: Right conjunctiva is injected.     Left eye: Left conjunctiva is injected.  Cardiovascular:     Rate and Rhythm: Normal rate and regular rhythm.     Heart sounds: No murmur heard. Pulmonary:     Effort: Pulmonary effort is normal. No respiratory distress.     Breath sounds:  Normal breath sounds.  Abdominal:     General: Bowel sounds are normal.     Palpations: Abdomen is soft.     Tenderness: There is no abdominal tenderness.  Musculoskeletal:        General: Normal range of motion.     Cervical back: Normal range of motion and neck supple.     Right lower leg: No edema.     Left lower leg: No edema.  Skin:    General: Skin is warm and dry.  Neurological:     Mental Status: She is alert and oriented to person, place, and time.  Psychiatric:        Mood and Affect: Mood normal.        Behavior: Behavior normal.        Thought Content: Thought content normal.        Judgment: Judgment normal.      Results for orders placed or performed in visit on 02/17/23  POCT Urinalysis Dipstick (Automated)  Result Value Ref Range   Color, UA yellow    Clarity, UA turbid    Glucose, UA Negative Negative   Bilirubin, UA     Ketones, UA     Spec Grav, UA 1.015 1.010 - 1.025   Blood, UA large    pH, UA 5.0 5.0 - 8.0   Protein, UA Positive (A) Negative   Urobilinogen, UA 0.2 0.2 or 1.0 E.U./dL   Nitrite, UA positive    Leukocytes, UA Large (3+) (A) Negative    Last CBC Lab Results  Component Value Date   WBC 20.4 (H) 10/04/2022   HGB 12.2 10/04/2022   HCT 37.9 10/04/2022   MCV 94.3 10/04/2022   MCH 30.3 10/04/2022   RDW 14.4 10/04/2022   PLT 186 10/04/2022   Last metabolic panel Lab Results  Component Value Date   GLUCOSE 145 (H) 08/19/2022   NA 138 08/19/2022   K 4.3 08/19/2022   CL 105 08/19/2022   CO2 24 08/19/2022   BUN 35 (H) 08/19/2022   CREATININE 2.42 (H) 08/19/2022   EGFR 33.0 11/05/2022   CALCIUM 8.8 08/19/2022   PHOS 3.4 01/22/2019   PROT 7.7 08/19/2022   ALBUMIN 4.0 08/19/2022   LABGLOB 3.4 06/16/2020   AGRATIO 1.3 06/16/2020   BILITOT 0.3 08/19/2022   ALKPHOS 194 (H) 08/19/2022   AST 18 08/19/2022   ALT 26 08/19/2022   ANIONGAP 6 06/04/2022   Last lipids Lab Results  Component Value Date   CHOL 66 08/19/2022   HDL  36.40 (L) 08/19/2022   LDLCALC  10 08/19/2022   TRIG 99.0 08/19/2022   CHOLHDL 2 08/19/2022   Last hemoglobin A1c Lab Results  Component Value Date   HGBA1C 6.1 02/15/2022   Last thyroid functions Lab Results  Component Value Date   TSH 3.60 08/19/2022   Last vitamin D Lab Results  Component Value Date   VD25OH 31.09 02/15/2022   Last vitamin B12 and Folate Lab Results  Component Value Date   VITAMINB12 950 (H) 02/15/2022   FOLATE >20.0 04/05/2018      The ASCVD Risk score (Arnett DK, et al., 2019) failed to calculate for the following reasons:   The valid total cholesterol range is 130 to 320 mg/dL    Assessment & Plan:   Problem List Items Addressed This Visit       Unprioritized   Paroxysmal atrial fibrillation (HCC)   Type 2 diabetes mellitus with other specified complication (HCC) - Primary   Relevant Orders   CBC with Differential/Platelet   Comprehensive metabolic panel   Lipid panel   Insulin, random   Vitamin D deficiency   Relevant Orders   VITAMIN D 25 Hydroxy (Vit-D Deficiency, Fractures)   Recurrent UTI (urinary tract infection)   Relevant Medications   cefpodoxime (VANTIN) 200 MG tablet   doxycycline (VIBRA-TABS) 100 MG tablet   Other Relevant Orders   POCT Urinalysis Dipstick (Automated) (Completed)   Urine Culture   Hypothyroidism   Relevant Orders   TSH   HLD (hyperlipidemia)   Benign essential HTN   Chronic renal impairment, stage 3b (HCC)   Other Visit Diagnoses     Ovarian cancer, unspecified laterality (HCC)       Relevant Medications   cefpodoxime (VANTIN) 200 MG tablet   doxycycline (VIBRA-TABS) 100 MG tablet   Colostomy status (HCC)       Conjunctivitis of both eyes, unspecified conjunctivitis type       Relevant Medications   moxifloxacin (VIGAMOX) 0.5 % ophthalmic solution   Microscopic hematuria       Relevant Orders   Urine Culture     Assessment and Plan    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 Previous successful treatment with a month-long course of doxycycline and a cephalosporin. -Prescribe a month-long course of doxycycline and a cephalosporin. -Collect urine for culture.  Eye Irritation: Reports of itching and redness in the eye. -Prescribe eye drops.  Respiratory Symptoms: Reports of hoarseness and lingering respiratory symptoms following a recent illness. -The prescribed antibiotics should also address these symptoms.  Diabetes Management: Reports of difficulty tolerating Monjoro, leading to elevated blood sugars. Currently off Monjoro and managing diabetes with diet. -Continue current management and follow-up with endocrinologist.  General Health Maintenance: -Continue with healthy weight and wellness program. -Plan for follow-up after husband's upcoming surgery and patient's church retreat.        Return in about 6 months (around 08/20/2023).    Donato Schultz, DO

## 2023-02-18 DIAGNOSIS — E559 Vitamin D deficiency, unspecified: Secondary | ICD-10-CM | POA: Diagnosis not present

## 2023-02-18 DIAGNOSIS — E1122 Type 2 diabetes mellitus with diabetic chronic kidney disease: Secondary | ICD-10-CM | POA: Diagnosis not present

## 2023-02-18 DIAGNOSIS — E785 Hyperlipidemia, unspecified: Secondary | ICD-10-CM | POA: Diagnosis not present

## 2023-02-18 DIAGNOSIS — E039 Hypothyroidism, unspecified: Secondary | ICD-10-CM | POA: Diagnosis not present

## 2023-02-18 DIAGNOSIS — E1169 Type 2 diabetes mellitus with other specified complication: Secondary | ICD-10-CM | POA: Diagnosis not present

## 2023-02-18 DIAGNOSIS — I129 Hypertensive chronic kidney disease with stage 1 through stage 4 chronic kidney disease, or unspecified chronic kidney disease: Secondary | ICD-10-CM | POA: Diagnosis not present

## 2023-02-18 LAB — INSULIN, RANDOM: Insulin: 23.6 u[IU]/mL — ABNORMAL HIGH

## 2023-02-19 LAB — URINE CULTURE
MICRO NUMBER:: 15217643
SPECIMEN QUALITY:: ADEQUATE

## 2023-02-20 ENCOUNTER — Other Ambulatory Visit: Payer: Self-pay | Admitting: Family Medicine

## 2023-02-20 MED ORDER — NITROFURANTOIN MONOHYD MACRO 100 MG PO CAPS: 100.0000 mg | ORAL_CAPSULE | Freq: Two times a day (BID) | ORAL | 0 refills | Status: DC

## 2023-02-21 ENCOUNTER — Telehealth: Payer: Self-pay

## 2023-02-21 NOTE — Telephone Encounter (Signed)
Called pt was advised, Pt picked Rx.

## 2023-02-22 DIAGNOSIS — E1142 Type 2 diabetes mellitus with diabetic polyneuropathy: Secondary | ICD-10-CM | POA: Diagnosis not present

## 2023-02-22 DIAGNOSIS — E1165 Type 2 diabetes mellitus with hyperglycemia: Secondary | ICD-10-CM | POA: Diagnosis not present

## 2023-02-22 DIAGNOSIS — E11649 Type 2 diabetes mellitus with hypoglycemia without coma: Secondary | ICD-10-CM | POA: Diagnosis not present

## 2023-02-22 DIAGNOSIS — E1122 Type 2 diabetes mellitus with diabetic chronic kidney disease: Secondary | ICD-10-CM | POA: Diagnosis not present

## 2023-02-23 ENCOUNTER — Telehealth: Payer: Self-pay

## 2023-02-23 NOTE — Telephone Encounter (Signed)
Sent message to provider

## 2023-02-23 NOTE — Telephone Encounter (Signed)
Pt called stated she has picked up her medication to treat UTI ,but she not sure if need to take all 3 antibiotics are what. Pt stated just needs to know what to do.

## 2023-02-23 NOTE — Telephone Encounter (Signed)
Sent message

## 2023-02-23 NOTE — Telephone Encounter (Signed)
Message sent

## 2023-03-03 ENCOUNTER — Other Ambulatory Visit (INDEPENDENT_AMBULATORY_CARE_PROVIDER_SITE_OTHER): Payer: Self-pay | Admitting: Family Medicine

## 2023-03-03 DIAGNOSIS — F3289 Other specified depressive episodes: Secondary | ICD-10-CM

## 2023-03-08 ENCOUNTER — Ambulatory Visit (INDEPENDENT_AMBULATORY_CARE_PROVIDER_SITE_OTHER): Payer: BC Managed Care – PPO | Admitting: Family Medicine

## 2023-03-08 ENCOUNTER — Encounter (INDEPENDENT_AMBULATORY_CARE_PROVIDER_SITE_OTHER): Payer: Self-pay | Admitting: Family Medicine

## 2023-03-08 VITALS — BP 122/71 | HR 61 | Temp 98.1°F | Ht 61.0 in | Wt 200.0 lb

## 2023-03-08 DIAGNOSIS — E669 Obesity, unspecified: Secondary | ICD-10-CM

## 2023-03-08 DIAGNOSIS — F3289 Other specified depressive episodes: Secondary | ICD-10-CM | POA: Diagnosis not present

## 2023-03-08 DIAGNOSIS — E1169 Type 2 diabetes mellitus with other specified complication: Secondary | ICD-10-CM

## 2023-03-08 DIAGNOSIS — Z6837 Body mass index (BMI) 37.0-37.9, adult: Secondary | ICD-10-CM | POA: Diagnosis not present

## 2023-03-08 DIAGNOSIS — Z7985 Long-term (current) use of injectable non-insulin antidiabetic drugs: Secondary | ICD-10-CM

## 2023-03-08 MED ORDER — BUPROPION HCL 100 MG PO TABS
100.0000 mg | ORAL_TABLET | Freq: Two times a day (BID) | ORAL | 0 refills | Status: AC
Start: 2023-03-08 — End: ?

## 2023-03-08 NOTE — Progress Notes (Unsigned)
Chief Complaint:   OBESITY [name] is here to discuss her progress with her obesity treatment plan along with follow-up of her obesity related diagnoses. [name] is on keeping a food journal and adhering to recommended goals of 1300 calories and 80+ grams of protein and states she is following her eating plan approximately 50% of the time. [name] states she is doing 0 minutes 0 times per week.  Today's visit was #: 70 Starting weight: 208 lbs Starting date: 04/05/2018 Today's weight: 200 lbs Today's date: 03/08/2023 Total lbs lost to date: 8 Total lbs lost since last in-office visit: 0  Interim History: Patient is struggling more to meal plan and meet her calorie and protein goals.  She feels her weight gain has been in part, due to decreased exercise.  Subjective:   1. Type 2 diabetes mellitus with other specified complication, unspecified whether long term insulin use (HCC) Patient recently started Mounjaro 2.5 mg as her glucose had been elevated.  She had some initial GI upset, but this has resolved.  2. Emotional Eating Behavior Patient is stable on Wellbutrin, and she continues to be mindful of emotional eating behavior.  Assessment/Plan:   1. Type 2 diabetes mellitus with other specified complication, unspecified whether long term insulin use (HCC) Patient is to continue Mounjaro at 2.5 mg and only increased to 5 mg if GI symptoms do not recur.  2. Emotional Eating Behavior Patient will continue Wellbutrin 100 mg twice daily, and we will refill for 90 days.  - buPROPion (WELLBUTRIN) 100 MG tablet; Take 1 tablet (100 mg total) by mouth 2 (two) times daily.  Dispense: 180 tablet; Refill: 0  3. BMI 37.0-37.9, adult  4. Obesity, Beginning BMI 38.04 [name] is currently in the action stage of change. As such, her goal is to continue with weight loss efforts. She has agreed to keeping a food journal and adhering to recommended goals of 1300 calories and 80+ grams of protein daily.    Exercise goals: All adults should avoid inactivity. Some physical activity is better than none, and adults who participate in any amount of physical activity gain some health benefits.  Behavioral modification strategies: increasing lean protein intake.  [name] has agreed to follow-up with our clinic in 4 weeks. She was informed of the importance of frequent follow-up visits to maximize her success with intensive lifestyle modifications for her multiple health conditions.   Objective:   Blood pressure 122/71, pulse 61, temperature 98.1 F (36.7 C), height 5\' 1"  (1.549 m), weight 200 lb (90.7 kg), SpO2 95%. Body mass index is 37.79 kg/m.  Lab Results  Component Value Date   CREATININE 1.84 (H) 02/17/2023   BUN 30 (H) 02/17/2023   NA 138 02/17/2023   K 4.1 02/17/2023   CL 105 02/17/2023   CO2 25 02/17/2023   Lab Results  Component Value Date   ALT 33 02/17/2023   AST 16 02/17/2023   ALKPHOS 200 (H) 02/17/2023   BILITOT 0.4 02/17/2023   Lab Results  Component Value Date   HGBA1C 6.1 02/15/2022   HGBA1C 6.0 07/23/2021   HGBA1C 6.3 (H) 06/16/2020   HGBA1C 7.9 (H) 09/01/2017   HGBA1C 5.3 04/01/2017   Lab Results  Component Value Date   INSULIN 14.1 06/16/2020   Lab Results  Component Value Date   TSH 2.27 02/17/2023   Lab Results  Component Value Date   CHOL 77 02/17/2023   HDL 39.20 02/17/2023   LDLCALC 14 02/17/2023   TRIG  118.0 02/17/2023   CHOLHDL 2 02/17/2023   Lab Results  Component Value Date   VD25OH 36.40 02/17/2023   VD25OH 31.09 02/15/2022   VD25OH 25.9 (L) 06/16/2020   Lab Results  Component Value Date   WBC 6.6 02/17/2023   HGB 11.7 (L) 02/17/2023   HCT 36.8 02/17/2023   MCV 91.4 02/17/2023   PLT 242.0 02/17/2023   Lab Results  Component Value Date   IRON 7 (L) 08/31/2017   TIBC 164 (L) 08/31/2017   FERRITIN 27 11/29/2014   Attestation Statements:   Reviewed by clinician on day of visit: allergies, medications, problem list, medical  history, surgical history, family history, social history, and previous encounter notes.   I, Burt Knack, am acting as transcriptionist for Quillian Quince, MD.  I have reviewed the above documentation for accuracy and completeness, and I agree with the above. -  Quillian Quince, MD

## 2023-03-13 ENCOUNTER — Other Ambulatory Visit: Payer: Self-pay | Admitting: Family Medicine

## 2023-03-13 ENCOUNTER — Other Ambulatory Visit (INDEPENDENT_AMBULATORY_CARE_PROVIDER_SITE_OTHER): Payer: Self-pay | Admitting: Family Medicine

## 2023-03-13 DIAGNOSIS — F3289 Other specified depressive episodes: Secondary | ICD-10-CM

## 2023-03-15 ENCOUNTER — Encounter (INDEPENDENT_AMBULATORY_CARE_PROVIDER_SITE_OTHER): Payer: Self-pay | Admitting: Family Medicine

## 2023-03-15 NOTE — Telephone Encounter (Signed)
Last OV 06/02/21 Next OV not scheduled  Pt has not been seen in over 1 year, will need OV prior to refill.

## 2023-03-16 ENCOUNTER — Other Ambulatory Visit: Payer: Self-pay | Admitting: Family Medicine

## 2023-03-16 DIAGNOSIS — N390 Urinary tract infection, site not specified: Secondary | ICD-10-CM

## 2023-03-23 ENCOUNTER — Other Ambulatory Visit: Payer: Self-pay | Admitting: Family Medicine

## 2023-03-23 DIAGNOSIS — N390 Urinary tract infection, site not specified: Secondary | ICD-10-CM | POA: Diagnosis not present

## 2023-03-23 DIAGNOSIS — N1832 Chronic kidney disease, stage 3b: Secondary | ICD-10-CM | POA: Diagnosis not present

## 2023-03-23 DIAGNOSIS — E1122 Type 2 diabetes mellitus with diabetic chronic kidney disease: Secondary | ICD-10-CM | POA: Diagnosis not present

## 2023-03-23 DIAGNOSIS — E872 Acidosis, unspecified: Secondary | ICD-10-CM | POA: Diagnosis not present

## 2023-03-23 DIAGNOSIS — G8929 Other chronic pain: Secondary | ICD-10-CM

## 2023-03-23 NOTE — Telephone Encounter (Signed)
Requesting: Lyrica Contract: N/A UDS: N/A Last Visit: 02/17/2023 Next Visit: 08/22/2023 Last Refill: 01/14/2023  Please Advise

## 2023-04-06 ENCOUNTER — Encounter (INDEPENDENT_AMBULATORY_CARE_PROVIDER_SITE_OTHER): Payer: Self-pay | Admitting: Family Medicine

## 2023-04-06 ENCOUNTER — Ambulatory Visit (INDEPENDENT_AMBULATORY_CARE_PROVIDER_SITE_OTHER): Payer: BC Managed Care – PPO | Admitting: Family Medicine

## 2023-04-06 VITALS — BP 127/73 | HR 64 | Ht 61.0 in | Wt 204.0 lb

## 2023-04-06 DIAGNOSIS — F439 Reaction to severe stress, unspecified: Secondary | ICD-10-CM | POA: Diagnosis not present

## 2023-04-06 DIAGNOSIS — E669 Obesity, unspecified: Secondary | ICD-10-CM

## 2023-04-06 DIAGNOSIS — Z6838 Body mass index (BMI) 38.0-38.9, adult: Secondary | ICD-10-CM

## 2023-04-06 NOTE — Progress Notes (Unsigned)
Chief Complaint:   OBESITY [name] is here to discuss her progress with her obesity treatment plan along with follow-up of her obesity related diagnoses. [name] is on keeping a food journal and adhering to recommended goals of 1300 calories and 80+ grams of protein and states she is following her eating plan approximately 25% of the time. [name] states she is doing 0 minutes 0 times per week.  Today's visit was #: 71 Starting weight: 208 lbs Starting date: 04/05/2018 Today's weight: 204 lbs Today's date: 04/06/2023 Total lbs lost to date: 4 Total lbs lost since last in-office visit: 0  Interim History: Patient has not been able to concentrate on her weight loss recently due to her husband's health issues.  She has been eating out more.  She is working on getting back on track.  Subjective:   1. Stress Patient has had increased stress with her husband's health.  She is working on getting back to a structured eating plan and eating out less.  Assessment/Plan:   1. Stress Patient was offered support and encouragement.  She feels her situation is better now.  We will continue to monitor.  2. BMI 38.0-38.9,adult  3. Obesity, Beginning BMI 38.04 [name] is currently in the action stage of change. As such, her goal is to continue with weight loss efforts. She has agreed to keeping a food journal and adhering to recommended goals of 1300 calories and 80 grams of protein daily.   Behavioral modification strategies: increasing lean protein intake and meal planning and cooking strategies.  [name] has agreed to follow-up with our clinic in 4 weeks. She was informed of the importance of frequent follow-up visits to maximize her success with intensive lifestyle modifications for her multiple health conditions.   Objective:   Blood pressure 127/73, pulse 64, height 5\' 1"  (1.549 m), weight 204 lb (92.5 kg), SpO2 98%. Body mass index is 38.55 kg/m.  Lab Results  Component Value Date    CREATININE 1.84 (H) 02/17/2023   BUN 30 (H) 02/17/2023   NA 138 02/17/2023   K 4.1 02/17/2023   CL 105 02/17/2023   CO2 25 02/17/2023   Lab Results  Component Value Date   ALT 33 02/17/2023   AST 16 02/17/2023   ALKPHOS 200 (H) 02/17/2023   BILITOT 0.4 02/17/2023   Lab Results  Component Value Date   HGBA1C 6.1 02/15/2022   HGBA1C 6.0 07/23/2021   HGBA1C 6.3 (H) 06/16/2020   HGBA1C 7.9 (H) 09/01/2017   HGBA1C 5.3 04/01/2017   Lab Results  Component Value Date   INSULIN 14.1 06/16/2020   Lab Results  Component Value Date   TSH 2.27 02/17/2023   Lab Results  Component Value Date   CHOL 77 02/17/2023   HDL 39.20 02/17/2023   LDLCALC 14 02/17/2023   TRIG 118.0 02/17/2023   CHOLHDL 2 02/17/2023   Lab Results  Component Value Date   VD25OH 36.40 02/17/2023   VD25OH 31.09 02/15/2022   VD25OH 25.9 (L) 06/16/2020   Lab Results  Component Value Date   WBC 6.6 02/17/2023   HGB 11.7 (L) 02/17/2023   HCT 36.8 02/17/2023   MCV 91.4 02/17/2023   PLT 242.0 02/17/2023   Lab Results  Component Value Date   IRON 7 (L) 08/31/2017   TIBC 164 (L) 08/31/2017   FERRITIN 27 11/29/2014   Attestation Statements:   Reviewed by clinician on day of visit: allergies, medications, problem list, medical history, surgical history, family history, social  history, and previous encounter notes.  Time spent on visit including pre-visit chart review and post-visit care and charting was 30 minutes.   I, Burt Knack, am acting as transcriptionist for Quillian Quince, MD.  I have reviewed the above documentation for accuracy and completeness, and I agree with the above. -  Quillian Quince, MD

## 2023-04-11 DIAGNOSIS — Z4801 Encounter for change or removal of surgical wound dressing: Secondary | ICD-10-CM | POA: Diagnosis not present

## 2023-04-11 DIAGNOSIS — Z933 Colostomy status: Secondary | ICD-10-CM | POA: Diagnosis not present

## 2023-04-11 DIAGNOSIS — L89319 Pressure ulcer of right buttock, unspecified stage: Secondary | ICD-10-CM | POA: Diagnosis not present

## 2023-04-11 DIAGNOSIS — Z936 Other artificial openings of urinary tract status: Secondary | ICD-10-CM | POA: Diagnosis not present

## 2023-04-26 ENCOUNTER — Encounter: Payer: Self-pay | Admitting: Family Medicine

## 2023-04-26 ENCOUNTER — Encounter (INDEPENDENT_AMBULATORY_CARE_PROVIDER_SITE_OTHER): Payer: Self-pay | Admitting: Family Medicine

## 2023-04-26 DIAGNOSIS — G8929 Other chronic pain: Secondary | ICD-10-CM

## 2023-04-28 ENCOUNTER — Other Ambulatory Visit: Payer: Self-pay | Admitting: Family

## 2023-04-28 DIAGNOSIS — G8929 Other chronic pain: Secondary | ICD-10-CM

## 2023-04-28 MED ORDER — PREGABALIN 50 MG PO CAPS
50.0000 mg | ORAL_CAPSULE | Freq: Two times a day (BID) | ORAL | 5 refills | Status: DC
Start: 2023-04-28 — End: 2023-10-06

## 2023-04-28 NOTE — Telephone Encounter (Signed)
Requesting: Lyrica 50mg  Contract: None UDS: None Last Visit: 02/17/23 Next Visit: 08/22/23 Last Refill: 03/23/23 #60 and 5RF  Please Advise

## 2023-05-04 ENCOUNTER — Ambulatory Visit (INDEPENDENT_AMBULATORY_CARE_PROVIDER_SITE_OTHER): Payer: Self-pay | Admitting: Family Medicine

## 2023-05-04 ENCOUNTER — Telehealth: Payer: Self-pay

## 2023-05-04 ENCOUNTER — Encounter (INDEPENDENT_AMBULATORY_CARE_PROVIDER_SITE_OTHER): Payer: Self-pay | Admitting: Family Medicine

## 2023-05-04 ENCOUNTER — Encounter: Payer: Self-pay | Admitting: Gynecologic Oncology

## 2023-05-04 VITALS — BP 128/77 | HR 60 | Temp 97.8°F | Ht 61.0 in | Wt 201.0 lb

## 2023-05-04 DIAGNOSIS — E1169 Type 2 diabetes mellitus with other specified complication: Secondary | ICD-10-CM | POA: Diagnosis not present

## 2023-05-04 DIAGNOSIS — Z6838 Body mass index (BMI) 38.0-38.9, adult: Secondary | ICD-10-CM | POA: Diagnosis not present

## 2023-05-04 DIAGNOSIS — F5089 Other specified eating disorder: Secondary | ICD-10-CM

## 2023-05-04 DIAGNOSIS — Z7985 Long-term (current) use of injectable non-insulin antidiabetic drugs: Secondary | ICD-10-CM

## 2023-05-04 DIAGNOSIS — F3289 Other specified depressive episodes: Secondary | ICD-10-CM

## 2023-05-04 DIAGNOSIS — E669 Obesity, unspecified: Secondary | ICD-10-CM

## 2023-05-04 MED ORDER — BUPROPION HCL 100 MG PO TABS
100.0000 mg | ORAL_TABLET | Freq: Two times a day (BID) | ORAL | 0 refills | Status: DC
Start: 2023-05-04 — End: 2023-06-01

## 2023-05-04 MED ORDER — MOUNJARO 5 MG/0.5ML ~~LOC~~ SOAJ
5.0000 mg | SUBCUTANEOUS | 0 refills | Status: DC
Start: 2023-05-04 — End: 2023-06-01

## 2023-05-04 NOTE — Telephone Encounter (Signed)
Updated patient regarding authorization process for granix injections. Patient is aware that our team is working on assisting her in this process as she changes insurance. However, our team members have not yet received any forms or documentation to assist in this process. Patient provided with email and fax number to send forms to.  Patient appreciative of update and will have insurance company send forms via email or fax.  Patient knows to call back should she have any additional questions or concerns.

## 2023-05-04 NOTE — Progress Notes (Unsigned)
Chief Complaint:   OBESITY [name] is here to discuss her progress with her obesity treatment plan along with follow-up of her obesity related diagnoses. [name] is on keeping a food journal and adhering to recommended goals of 1300 calories and 80 grams of protein and states she is following her eating plan approximately 75% of the time. [name] states she is walking and doing HIT for 30-60 minutes 2 times per week.  Today's visit was #: 72 Starting weight: 208 lbs Starting date: 04/05/2018 Today's weight: 201 lbs Today's date: 05/04/2023 Total lbs lost to date: 7 Total lbs lost since last in-office visit: 3  Interim History: Patient has done well with her weight loss.  She has had extra challenges while helping a friend, but her hunger is controlled.  Subjective:   1. Type 2 diabetes mellitus with other specified complication, unspecified whether long term insulin use (HCC) Patient is working on her diet and weight loss.  She is doing well on her Mounjaro.  2. Emotional Eating Behavior Patient is working on decreasing emotional eating behavior.  No side effects were noted.  Assessment/Plan:   1. Type 2 diabetes mellitus with other specified complication, unspecified whether long term insulin use (HCC) Patient will continue Mounjaro 5 mg once weekly, and we will refill for 1 month.  - MOUNJARO 5 MG/0.5ML Pen; Inject 5 mg into the skin once a week.  Dispense: 2 mL; Refill: 0  2. Emotional Eating Behavior Patient will continue Wellbutrin 100 mg twice daily, and we will refill for 90 days.  - buPROPion (WELLBUTRIN) 100 MG tablet; Take 1 tablet (100 mg total) by mouth 2 (two) times daily.  Dispense: 180 tablet; Refill: 0  3. BMI 38.0-38.9,adult  4. Obesity, Beginning BMI 38.04 [name] is currently in the action stage of change. As such, her goal is to continue with weight loss efforts. She has agreed to keeping a food journal and adhering to recommended goals of 1300 calories and 80+  grams of protein daily.   Exercise goals: As is.   Behavioral modification strategies: increasing lean protein intake.  [name] has agreed to follow-up with our clinic in 4 weeks. She was informed of the importance of frequent follow-up visits to maximize her success with intensive lifestyle modifications for her multiple health conditions.   Objective:   Blood pressure 128/77, pulse 60, temperature 97.8 F (36.6 C), height 5\' 1"  (1.549 m), weight 201 lb (91.2 kg), SpO2 100%. Body mass index is 37.98 kg/m.  Lab Results  Component Value Date   CREATININE 1.84 (H) 02/17/2023   BUN 30 (H) 02/17/2023   NA 138 02/17/2023   K 4.1 02/17/2023   CL 105 02/17/2023   CO2 25 02/17/2023   Lab Results  Component Value Date   ALT 33 02/17/2023   AST 16 02/17/2023   ALKPHOS 200 (H) 02/17/2023   BILITOT 0.4 02/17/2023   Lab Results  Component Value Date   HGBA1C 6.1 02/15/2022   HGBA1C 6.0 07/23/2021   HGBA1C 6.3 (H) 06/16/2020   HGBA1C 7.9 (H) 09/01/2017   HGBA1C 5.3 04/01/2017   Lab Results  Component Value Date   INSULIN 14.1 06/16/2020   Lab Results  Component Value Date   TSH 2.27 02/17/2023   Lab Results  Component Value Date   CHOL 77 02/17/2023   HDL 39.20 02/17/2023   LDLCALC 14 02/17/2023   TRIG 118.0 02/17/2023   CHOLHDL 2 02/17/2023   Lab Results  Component Value Date  VD25OH 36.40 02/17/2023   VD25OH 31.09 02/15/2022   VD25OH 25.9 (L) 06/16/2020   Lab Results  Component Value Date   WBC 6.6 02/17/2023   HGB 11.7 (L) 02/17/2023   HCT 36.8 02/17/2023   MCV 91.4 02/17/2023   PLT 242.0 02/17/2023   Lab Results  Component Value Date   IRON 7 (L) 08/31/2017   TIBC 164 (L) 08/31/2017   FERRITIN 27 11/29/2014   Attestation Statements:   Reviewed by clinician on day of visit: allergies, medications, problem list, medical history, surgical history, family history, social history, and previous encounter notes.   I, Burt Knack, am acting as  transcriptionist for Quillian Quince, MD.  I have reviewed the above documentation for accuracy and completeness, and I agree with the above. -  Quillian Quince, MD

## 2023-05-06 ENCOUNTER — Telehealth: Payer: Self-pay

## 2023-05-06 ENCOUNTER — Inpatient Hospital Stay: Payer: BC Managed Care – PPO | Attending: Gynecologic Oncology | Admitting: Gynecologic Oncology

## 2023-05-06 ENCOUNTER — Encounter: Payer: Self-pay | Admitting: Gynecologic Oncology

## 2023-05-06 VITALS — BP 104/45 | HR 64 | Temp 98.7°F | Ht 62.0 in | Wt 206.0 lb

## 2023-05-06 DIAGNOSIS — Z853 Personal history of malignant neoplasm of breast: Secondary | ICD-10-CM | POA: Diagnosis not present

## 2023-05-06 DIAGNOSIS — L089 Local infection of the skin and subcutaneous tissue, unspecified: Secondary | ICD-10-CM | POA: Diagnosis not present

## 2023-05-06 DIAGNOSIS — Z9079 Acquired absence of other genital organ(s): Secondary | ICD-10-CM | POA: Diagnosis not present

## 2023-05-06 DIAGNOSIS — Z8543 Personal history of malignant neoplasm of ovary: Secondary | ICD-10-CM | POA: Diagnosis not present

## 2023-05-06 DIAGNOSIS — Z90722 Acquired absence of ovaries, bilateral: Secondary | ICD-10-CM | POA: Insufficient documentation

## 2023-05-06 DIAGNOSIS — Z9013 Acquired absence of bilateral breasts and nipples: Secondary | ICD-10-CM | POA: Insufficient documentation

## 2023-05-06 DIAGNOSIS — Z8542 Personal history of malignant neoplasm of other parts of uterus: Secondary | ICD-10-CM

## 2023-05-06 DIAGNOSIS — Z923 Personal history of irradiation: Secondary | ICD-10-CM | POA: Insufficient documentation

## 2023-05-06 DIAGNOSIS — Z90710 Acquired absence of both cervix and uterus: Secondary | ICD-10-CM | POA: Diagnosis not present

## 2023-05-06 DIAGNOSIS — N898 Other specified noninflammatory disorders of vagina: Secondary | ICD-10-CM | POA: Insufficient documentation

## 2023-05-06 DIAGNOSIS — Z9221 Personal history of antineoplastic chemotherapy: Secondary | ICD-10-CM | POA: Insufficient documentation

## 2023-05-06 MED ORDER — DOXYCYCLINE HYCLATE 100 MG PO TABS
100.0000 mg | ORAL_TABLET | Freq: Two times a day (BID) | ORAL | 0 refills | Status: AC
Start: 2023-05-06 — End: 2023-05-16

## 2023-05-06 NOTE — Patient Instructions (Signed)
It was good to see you today.  I do not see or feel any evidence of cancer recurrence on your exam.  I will see you for follow-up in 6 months.  I am sending a prescription for doxycycline to your pharmacy for skin infection.  Please let me know if this does not improve with antibiotics.  As always, if you develop any new and concerning symptoms before your next visit, please call to see me sooner.

## 2023-05-06 NOTE — Progress Notes (Signed)
Gynecologic Oncology Return Clinic Visit  05/06/23  Reason for Visit: surveillance  Treatment History: Oncology History Overview Note  Breast cancer of upper-inner quadrant of left female breast [facility])   Staging form: Breast, AJCC 7th Edition     Clinical stage from 12/31/2015: Stage IIA (T2, N0, M0) - Signed by Artis Delay, MD on 12/31/2015  ER,PR positive Her2 neg   History of ovarian & endometrial cancer  10/15/2004 Initial Diagnosis   History of ovarian cancer, treated with chemotherapy carbo/Taxol   03/17/2009 Bone Marrow Biopsy   Bone marrow biopsy at [facility] showed neutropenia   01/23/2014 Bone Marrow Biopsy   Repeat bone marrow biopsy showed neutropenia   09/12/2014 Imaging   CT scan showed possible cancer   10/14/2014 Imaging   MRI show significant pelvic mass without invasion into the bladder but abutting to the rectum   11/19/2014 Surgery   she underwent surgery and had robotic-assisted lysis of adhesions, converted to laparotomy, radical upper vaginectomy and low anterior resection with colostomy. 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 Resection margins were negative   01/29/2015 - 03/10/2015 Radiation Therapy   She received adjuvant radiation   03/03/2015 Imaging   CT scan of the abdomen and pelvis showed status post interval removal of the bilateral nephroureteral stents. Worsening moderate right hydroureteronephrosis. Resolved left hydroureteronephrosis.   03/20/2015 Procedure   she had cystoscopy and stent placement for right hydronephrosis   05/02/2015 Surgery   Cystoscopy with right retrograde pyelogram interpretation. Diagnostic ureteroscopy. Removal of right ureteral stent.   05/14/2015 Imaging   CT scan of the abdomen and pelvis show unilateral right hydronephrosis  with no residual cancer   08/18/2015 Imaging   CT scan showed hysterectomy with stable presacral soft tissue thickening. No definitive evidence of recurrent or metastatic disease. Severe right hydronephrosis   09/12/2015 Surgery   Cystoscopy with right retrograde pyelogram interpretation. Right ureteral stent placement 5 x 24 Polaris, no tether   10/07/2016 Imaging   Stable exam.  No new or progressive findings. 2. Stable appearance abnormal presacral soft tissue compatible with post treatment change. 3. Internal right ureteral stent with persistent right hydroureteronephrosis. Differentially decreased perfusion of the right kidney suggests a component of underlying obstructive uropathy. 4. Stable 13 mm left adrenal nodule, previously characterized as adenoma.       01/27/2017 Imaging   Stable postop and post radiation changes in presacral region. No evidence of recurrent or metastatic carcinoma within the chest, abdomen, or pelvis. Stable mild to moderate right hydroureteronephrosis, with right ureteral stent in appropriate position. Stable small benign left adrenal adenoma and left thyroid lobe nodule.   10/02/2019 Tumor Marker   Patient's tumor was tested for the following markers: CA-125 Results of the tumor marker test revealed 5.8   Breast cancer of upper-inner quadrant of left female breast (HCC)  10/14/2015 Imaging   Screening mammogram showed possible distortion in the left breast.   10/24/2015 Imaging   Targeted ultrasound is performed, showing a 0.6 x 0.8 x 0.9 cm area of hypoechoic distortion at the 10 o'clock position of the left breast 5 cm from the nipple   10/24/2015 Imaging   Diagnostic imaging confirmed 0.6 x 0.8 x 0.9 cm distortion in the upper inner left breast   10/30/2015 Procedure   Left US guided biopsy was performed   10/30/2015 Pathology Results  Accession: [id number] showed invasive ductal carcinoma, ER/PR positive, Her2 neg   11/05/2015 Genetic Testing   Genetic  testing did not reveal a deleterious mutation.  Genetic testing did detect a Variant of Unknown Significance in the MSH6 gene called c.389A>G..  Genes tested include: APC, ATM, AXIN2, BARD1, BMPR1A, BRCA1, BRCA2, BRIP1, CDH1, CDKN2A, CHEK2, DICER1, EPCAM, GREM1, KIT, MEN1, MLH1, MSH2, MSH6, MUTYH, NBN, NF1, PALB2, PDGFRA, PMS2, POLD1, POLE, PTEN, RAD50, RAD51C, RAD51D, SDHA, SDHB, SDHC, SDHD, SMAD4, SMARCA4. STK11, TP53, TSC1, TSC2, and VHL.  UPDATE: MSH6 c.389A>G (p.His130Arg) has been reclassified as Likely Benign.  The amended report date is February 25, 2021.   12/11/2015 Surgery   She undwerwent bilateral mastectomy, left SLN biopsy and immediate reconstruction surgeries with bilateral plasma expanders   12/11/2015 Pathology Results   Accession: [id number] mastectomy specimens showed left breast ca, pT2N0M0   12/26/2015 Pathology Results   Accession: [id number] specimen from debridement showed no cancer   12/26/2015 Surgery   she underwent surgical debridement of necrotic tissue at site of plasma expander   01/12/2016 Imaging   Bone density is normal   02/02/2016 -  Anti-estrogen oral therapy   She started taking Arimidex   10/21/2017 Procedure   Ultrasound guided biopsy of a vague hypoechoic shadowing lesion, generally corresponding to area palpable abnormality, along chest lateral to the reconstructed left breast and inferior to the left axilla. No apparent complications.   01/17/2018 Imaging   Bone density scan is normal    She has a history of endometrial and ovarian endometrioid carcinoma treated in 2006 by Dr. Hazle Coca at [facility] in [place]. Her surgery (TAH, BSO) was followed by adjuvant chemotherapy with carboplatin plus paclitaxel due to the ovarian involvement and the presence of a cul de sac lesion also positive for disease. She denies receiving adjuvant radiation therapy. She is unclear if she had metastatic endometrial cancer to the ovary or duel primaries.  She had a complete response to therapy however developed leukopenia in July 2010. 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 She sees Dr. Bertis Ruddy for this and is treated with G-CSF injections.   She began experiencing rectal pain approximately in January 2016. She also reports narrowing of caliber of the stool. She denies hematochezia. She does report approximately 3 months of vaginal spotting. She's had no specific follow-up for her gynecologic cancers in the past 4 years.   As part of workup of her rectal pain she underwent a CT scan of the abdomen and pelvis on 09/12/2014. This demonstrated a new right perirectal mass abutting the vaginal cuff measuring 3.8 x 4.9 cm. There is a limited fat plane between the mass and the rectum posteriorly. Rectal invasion could not be excluded. There were no other masses identified in the abdomen and pelvis or lymphadenopathy. There is no other evidence of metastatic disease or recurrent disease. There was no hydronephrosis. A CA-125 drawn on 09/12/2014 was normal at 10.   PET was negative for extrapelvic disease. MRI defined the lesion as a 3.5x5cm lesion to the right of the rectum at the vaginal cuff.   Colonoscopy was performed on 10/02/14 with transrectal [place] and biopsy and this revealed endometrioid adenocarcinoma. Of note, the lesion was not seen within the lumen of the rectum.    She then underwent a posterior supralevator exenteration with colostomy and bladder repair and stent placement on 11/19/14 without complications with Dr Andrey Farmer and Dr  Gross.  Her postoperative course was uncomplicate with the exception of development of postop anemia.  Her final pathology revealed endometrioid adenocarcinoma invading the vagina and rectum with negative margins on the specimen. The margin had been close (clinically) to the right pelvic sidewall which was marked  with surgical clips.   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 It was treated with IV antibiotics and then on POD 15 she was taken to the OR for laparoscopic revision of the stoma with Dr Karie Soda. Postoperatively she had wound vac and packing for her stomal wound.   A retrograde cystogram on week 4 postop confirmed an intact bladder and the foley was removed.   She initially had some voiding issues with decreased sensation to void. We tested a post void residual in May, 2016 and this revealed adequate voiding.   On 12/30/14 she was admitted to [facility] with sepsis associated with Enterobacter Cloacae UTI. This was treated with IV antibiotics and then a prolonged course of oral cipro. Imaging performed at the time of admission (a CT of the abdo/pelvis) revealed: Bilateral double-J internal ureteral stents in adequate position with persistent mild bilateral hydronephrosis    She complete radiation therapy from 01/29/15 to 03/10/15 with 50Gy of external beam radiation and IMRT. She tolerated therapy well with minor skin irritation.   She had her ureteral stents removed in June, 2016, however, then developed right ureteral obstruction, hydroureter and pain. She went to the OR with Urologist Dr Berneice Heinrich on 03/20/15 for ureteral dilation and right stent placement (the left was draining well). No tumor was seen on cysto.    CT abdo/pelvis on 05/14/15 showed: no new lesions, stable thickening in right pelvis/distal right ureter consistent with radiation effect. The right ureteral stent was removed.   The patient's CT in January 2017 showed progression of her right ureteral obstruction after stent removal. Dr Berneice Heinrich took her to the OR on 09/05/15 for a cystoscopy, retrograde pyelogram and right ureteral stent placement.    The CT on 08/18/15 showed decreased attenuation of the right renal parenchyma, right hydronephrosis that was  severe no excretion of contrast on that for graphic phase imaging. The perivascular soft tissue thickening in the pelvis and presacral regions grossly stable consistent with radiation changes.   The patient was diagnosed with ER/PR positive left breast cancer in April 2017. Surgery is scheduled for May. She underwent bilateral mastectomy with expander reconstruction. Postoperatively she was treated with Arimidex.    Repeat CT imaging on 02/08/16 revealed : Stable post treatment changes in presacral region. No evidence of recurrent or metastatic carcinoma within the abdomen or pelvis. Interval placement of right ureteral stent in appropriate position. Moderate right hydroureteronephrosis remains stable. Stable benign left adrenal adenoma and hepatic steatosis.   On 06/30/16 she had replacement of her right ureteral stent with Dr Berneice Heinrich.   On 10/07/16 CT surveillance was performed which showed: Stable exam.  No new or progressive findings. Stable appearance abnormal presacral soft tissue compatible with post treatment change. Internal right ureteral stent with persistent right hydroureteronephrosis. Differentially decreased perfusion of the right kidney suggests a component of underlying obstructive uropathy. Stable 13 mm left adrenal nodule, previously characterized as adenoma. A parastomal hernia was noted.   CT 01/27/17 showed stable findings with no evidence of recurrence. Persistent right hydroureter was again noted.   Her colostomy bothered her greatly and she hoped to be free of stomal  appliances. She was frustrated with repetitive stent replacements and desired definitive reconstruction/reimplantation of the right ureter.     Given that a 2 year disease free interval had elapsed, in 04/01/17 Dr Berneice Heinrich and Dr Michaell Cowing returned to the operating room with Ms Balla for a right ureteral resection, and bladder hitch with reimplantation and reversal of hartman's with temporary ileostomy complicated by postop  development of urinoma which required a peritoneal drain and right ureteral stent.   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 She was offered permanent diversion with ileal conduit and permanent end colostomy. This was not considered a reasonable option at that time by [facility]. She became quite depressed by the slow recovery and sequelae of her last surgery. She sought second opinion with Gynecologic Oncology at [facility]. They offered referral to urology and colorectal specialists at [facility]. They recommended no additional cancer treatment as she was disease free. She is being worked up by them for possible laparotomy, revision of right uretero-neocystotomy, placement of VRAM flap, and possible bowel resection and revision of ileostomy. Prior to embarking on this surgery they requested she lose weight to a BMI of <30kg/m2.   She had lost weight to a BMI <30 throughout 2020. She experienced some episodes of passage of bloody mucous from the vagina in May, 2020 and so an CT scan was performed which showed no signs of pelvic recurrence.    In September the patient embarked on a large procedure at [facility].  She underwent an exploratory laparotomy panniculectomy cystectomy creation of urinary conduit with ileum, proctectomy, and formation of permanent and colostomy.  The procedure was uncomplicated.  She reported that while plastic surgery had planned to take part in the procedure with placement of a flap, it was determined that this was not necessary intraoperatively.  While the procedure note in Care Everywhere states that a hysterectomy was performed, this had already been performed more than a decade prior.  Additionally there was no myocutaneous flap placed despite the procedure reporting as such.  No malignancy was identified in the specimens.   The patient had an  uncomplicated procedure overall and a 7-day hospital stay with subsequent rehab.  She developed some wound healing issues at her colostomy site.   She is struggled with vaginal drainage and bleeding since.   She was seen in our clinic by Dr. Andrey Farmer in June 2021.  In the setting of continued vaginal bleeding and drainage, she sought consultation at [facility] with Dr. Johnnette Litter.  On exam, vagina noted to be atrophic.  Blood was seen from the apex of the vaginal vault and on bimanual exam the vault and apex extended posteriorly towards the sacrum without an obvious defect.  Given some concern for possibility of osteomyelitis this driving this drainage, MRI was performed.  MRI on 09/27/2020 showed a 2.3 x 1.9 x 3.3 cm fluid collection that appears contiguous with the rectal pouch via thickened fluid-filled tract and questionable thin fistulous tract anteriorly to the vaginal cuff.  Marked presacral soft tissue thickening and stranding likely reflecting posttreatment changes noted.  No definite evidence of local recurrence.   08/19/21: Excision of 1 cm left upper vulva/groin mass, Examiner anesthesia, vulvar biopsies.  Pathology revealed benign epidermal inclusion cyst.  Vaginal biopsy showed ulcerated squamous mucosa with lymphoplasmacytic infiltrate, no dysplasia or malignancy.  08/25/21: CT of the abdomen and pelvis showed no acute findings.  No  evidence of recurrent or metastatic disease.  Stable small benign left adrenal adenoma.   CT of the abdomen and pelvis in April 2024 was negative for acute intra-abdominal or pelvic pathology including no evidence of metastatic disease.  Posttreatment soft tissue changes in the pelvis and presacral region stable.  No drainable fluid collection.  Interval History: Overall notes doing okay.  Was treated for urinary tract infection before recent trip to [place].  Overall, small amount of vaginal drainage at this point.  Vaginal discharge responds and often resolves completely  well on and immediately after antibiotics, slowly comes back.  Denies any bleeding.  Denies any abdominal or pelvic pain.  Has noticed some red skin bumps around her urostomy, typically has skin changes like this with mild infection around her colostomy if she gets them.  Past Medical/Surgical History: 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(TAH w/ BSO) :  recurrent 11-19-2014 post pelvic surgery and radiation 01-29-2015 to 03-10-2015   Chronic idiopathic neutropenia (HCC)    presumed related to chemotherapy March 2006--- followed by dr Bertis Ruddy (treatment w/ G-CSF injections   Chronic nausea    CKD (chronic kidney disease), stage III [facility])    nephrologist-  dr Bufford Buttner   Difficult intravenous access    small veins--- hx PICC lines   DM type 2 (diabetes mellitus, type 2) (HCC)    endocriologist--  dr Joselyn Glassman Janee Morn   (08-18-2021  per pt checks blood sugar daily in am,  fasting sugar--92--130)   Dysrhythmia    Environmental and seasonal allergies    Generalized muscle weakness    GERD (gastroesophageal reflux disease)    Hiatal hernia    History of abdominal abscess 04/16/2017   post surgery 04-01-2017  --- resolved 10/ 2018   History of COVID-19 05/2021   per pt mild symptoms that resolved   History of gastric polyp    2014  duodenum   History of ileus 04/16/2017   resolved w/ no surgical intervention   History of radiation therapy    01-29-2015 to 03-10-2015  pelvis  50.4Gy   History of small bowel obstruction 01/2019   Hypothyroidism    monitored by dr Casimiro Needle altheimer   Ileostomy in place [facility]) 04/01/2017   created at same time colostomy takedown./  permnant 09/ 2020   Mixed dyslipidemia    Multiple thyroid nodules    Managed by Dr. Gerrit Friends   PAF (paroxysmal atrial fibrillation) [facility])    cardiologist--- dr h. 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   Vitamin D deficiency    Vulval lesion    Wears glasses  Past Surgical History:  Procedure Laterality Date   APPENDECTOMY     BREAST RECONSTRUCTION WITH PLACEMENT OF TISSUE EXPANDER AND FLEX HD (ACELLULAR HYDRATED DERMIS) Bilateral 12/11/2015   Procedure: BILATERAL BREAST RECONSTRUCTION WITH PLACEMENT OF TISSUE EXPANDERS;  Surgeon: Glenna Fellows, MD;  Location: [facility] OR;  Service: Plastics;  Laterality: Bilateral;   COLONOSCOPY WITH PROPOFOL N/A 08/21/2013   Procedure: COLONOSCOPY WITH PROPOFOL;  Surgeon: Florencia Reasons, MD;  Location: [facility];  Service: Endoscopy;  Laterality: N/A;   COLOSTOMY TAKEDOWN N/A 12/04/2014   Procedure: LAPROSCOPIC LYSIS OF ADHESIONS, SPLENIC MOBILIZATION, RELOCATION OF COLOSTOMY, DEBRIDEMENT INITIAL COLOSTOMY SITE;  Surgeon: Karie Soda, MD;  Location: [facility];  Service: General;  Laterality: N/A;   CYSTECTOMY W/ URETEROILEAL CONDUIT  04/12/2019   @[facility] ;   INTESTINAL ANASTOMOSIS, PANNICULECTOMY, PELVIS EXENTENTION,  PARTIAL COLECTOMY REMOVAL ILEIUM WITH PERMNANT ILEOCOLOSTOMY   CYSTOGRAM N/A 06/01/2017   Procedure: CYSTOGRAM;  Surgeon: Sebastian Ache, MD;  Location: [facility];  Service: Urology;  Laterality: N/A;   CYSTOSCOPY W/  RETROGRADES Right 11/21/2015   Procedure: CYSTOSCOPY WITH RETROGRADE PYELOGRAM;  Surgeon: Sebastian Ache, MD;  Location: [facility];  Service: Urology;  Laterality: Right;   CYSTOSCOPY W/ URETERAL STENT PLACEMENT Right 11/21/2015   Procedure: CYSTOSCOPY WITH STENT REPLACEMENT;  Surgeon: Sebastian Ache, MD;  Location: [facility];  Service: Urology;  Laterality: Right;   CYSTOSCOPY W/ URETERAL STENT PLACEMENT Right 03/10/2016   Procedure: CYSTOSCOPY WITH STENT REPLACEMENT;  Surgeon: Sebastian Ache, MD;  Location: [facility];  Service: Urology;  Laterality: Right;   CYSTOSCOPY W/ URETERAL STENT PLACEMENT Right 06/30/2016   Procedure: CYSTOSCOPY WITH RETROGRADE PYELOGRAM/URETERAL STENT EXCHANGE;  Surgeon: Sebastian Ache, MD;  Location: [facility];  Service: Urology;  Laterality: Right;   CYSTOSCOPY W/ URETERAL STENT PLACEMENT N/A 06/01/2017   Procedure: CYSTOSCOPY WITH EXAM UNDER ANESTHESIA;  Surgeon: Sebastian Ache, MD;  Location: [facility];  Service: Urology;  Laterality: N/A;   CYSTOSCOPY W/ URETERAL STENT PLACEMENT Right 08/17/2017   Procedure: CYSTOSCOPY WITH RETROGRADE PYELOGRAM/URETERAL STENT REMOVAL;  Surgeon: Sebastian Ache, MD;  Location: [facility];  Service: Urology;  Laterality: Right;   CYSTOSCOPY WITH RETROGRADE PYELOGRAM, URETEROSCOPY AND STENT PLACEMENT Right 03/20/2015   Procedure: CYSTOSCOPY WITH RETROGRADE PYELOGRAM, URETEROSCOPY WITH BALLOON DILATION AND STENT PLACEMENT ON RIGHT;  Surgeon: Sebastian Ache, MD;  Location: [facility];  Service: Urology;  Laterality: Right;   CYSTOSCOPY WITH RETROGRADE PYELOGRAM, URETEROSCOPY AND STENT PLACEMENT Right 05/02/2015   Procedure: CYSTOSCOPY WITH RIGHT RETROGRADE PYELOGRAM,  DIAGNOSTIC URETEROSCOPY AND STENT PULL ;  Surgeon: Sebastian Ache, MD;  [facility];  Service: Urology;  Laterality: Right;   CYSTOSCOPY WITH RETROGRADE PYELOGRAM, URETEROSCOPY AND STENT  PLACEMENT Right 09/05/2015   Procedure: CYSTOSCOPY WITH RETROGRADE PYELOGRAM,  AND STENT PLACEMENT;  Surgeon: Sebastian Ache, MD;  Location: [facility];  Service: Urology;  Laterality: Right;   CYSTOSCOPY WITH RETROGRADE PYELOGRAM, URETEROSCOPY AND STENT PLACEMENT Right 04/01/2017   Procedure: CYSTOSCOPY WITH RETROGRADE PYELOGRAM, URETEROSCOPY AND STENT PLACEMENT;  Surgeon: Sebastian Ache, MD;  Location: [facility];  Service: Urology;  Laterality: Right;   CYSTOSCOPY WITH STENT PLACEMENT Right 10/27/2016   Procedure: CYSTOSCOPY WITH STENT CHANGE and right retrograde pyelogram;  Surgeon: Sebastian Ache, MD;  Location[facility];  Service: Urology;  Laterality: Right;   EUS N/A 10/02/2014   Procedure: LOWER ENDOSCOPIC ULTRASOUND (EUS);  Surgeon: Willis Modena, MD;  Location: [facility];  Service: Endoscopy;  Laterality: N/A;   EXCISION SOFT  TISSUE MASS RIGHT FOREMAN  12/08/2006   EYE SURGERY  as child   pytosis of eyelids repair   INCISION AND DRAINAGE OF WOUND Bilateral 12/26/2015   Procedure: DEBRIDEMENT OF BILATERAL MASTECTOMY FLAPS;  Surgeon: Glenna Fellows, MD;  Location: [facility];  Service: Plastics;  Laterality: Bilateral;   IR CV LINE INJECTION  05/31/2017   IR FLUORO GUIDE CV LINE LEFT  05/31/2017   IR FLUORO GUIDE CV LINE RIGHT  04/06/2017   IR FLUORO GUIDE CV MIDLINE PICC RIGHT  05/30/2017   IR NEPHROSTOGRAM LEFT INITIAL PLACEMENT  09/02/2017   IR NEPHROSTOGRAM LEFT THRU EXISTING ACCESS  11/29/2017   IR NEPHROSTOGRAM RIGHT INITIAL PLACEMENT  09/02/2017   IR NEPHROSTOGRAM RIGHT THRU EXISTING ACCESS  09/13/2017   IR NEPHROSTOGRAM RIGHT THRU EXISTING ACCESS  11/29/2017   IR NEPHROSTOMY EXCHANGE LEFT  11/28/2017   IR NEPHROSTOMY EXCHANGE LEFT  01/05/2018   IR NEPHROSTOMY EXCHANGE LEFT  02/16/2018   IR NEPHROSTOMY EXCHANGE LEFT  03/30/2018   IR NEPHROSTOMY EXCHANGE LEFT  05/12/2018   IR NEPHROSTOMY EXCHANGE LEFT  06/21/2018   IR NEPHROSTOMY EXCHANGE  LEFT  08/04/2018   IR NEPHROSTOMY EXCHANGE LEFT  09/18/2018   IR NEPHROSTOMY EXCHANGE LEFT  10/09/2018   IR NEPHROSTOMY EXCHANGE LEFT  10/27/2018   IR NEPHROSTOMY EXCHANGE LEFT  11/21/2018   IR NEPHROSTOMY EXCHANGE LEFT  01/05/2019   IR NEPHROSTOMY EXCHANGE LEFT  02/15/2019   IR NEPHROSTOMY EXCHANGE LEFT  03/29/2019   IR NEPHROSTOMY EXCHANGE RIGHT  10/02/2017   IR NEPHROSTOMY EXCHANGE RIGHT  11/28/2017   IR NEPHROSTOMY EXCHANGE RIGHT  01/05/2018   IR NEPHROSTOMY EXCHANGE RIGHT  02/16/2018   IR NEPHROSTOMY EXCHANGE RIGHT  03/30/2018   IR NEPHROSTOMY EXCHANGE RIGHT  05/12/2018   IR NEPHROSTOMY EXCHANGE RIGHT  06/21/2018   IR NEPHROSTOMY EXCHANGE RIGHT  08/04/2018   IR NEPHROSTOMY EXCHANGE RIGHT  09/18/2018   IR NEPHROSTOMY EXCHANGE RIGHT  10/27/2018   IR NEPHROSTOMY EXCHANGE RIGHT  11/21/2018   IR NEPHROSTOMY EXCHANGE RIGHT  01/05/2019   IR NEPHROSTOMY EXCHANGE RIGHT  02/15/2019   IR NEPHROSTOMY EXCHANGE RIGHT  03/29/2019   IR NEPHROSTOMY PLACEMENT LEFT  10/02/2017   IR RADIOLOGIST EVAL & MGMT  05/03/2017   IR US GUIDE VASC ACCESS LEFT  05/31/2017   IR US GUIDE VASC ACCESS RIGHT  04/06/2017   IR US GUIDE VASC ACCESS RIGHT  05/30/2017   LAPAROSCOPIC CHOLECYSTECTOMY  1990   LIPOSUCTION WITH LIPOFILLING Bilateral 04/16/2016   Procedure: LIPOSUCTION WITH LIPOFILLING TO BILATERAL CHEST;  Surgeon: Glenna Fellows, MD;  Location: [facility];  Service: Plastics;  Laterality: Bilateral;   MASTECTOMY W/ SENTINEL NODE BIOPSY Bilateral 12/11/2015   Procedure: RIGHT PROPHYLACTIC MASTECTOMY, LEFT TOTAL MASTECTOMY WITH LEFT AXILLARY SENTINEL LYMPH NODE BIOPSY;  Surgeon: Almond Lint, MD;  Location: [facility];  Service: General;  Laterality: Bilateral;   OSTOMY N/A 11/19/2014   Procedure: OSTOMY;  Surgeon: Karie Soda, MD;  Location: [facility];  Service: General;  Laterality: N/A;   PROCTOSCOPY N/A 04/01/2017   Procedure: RIDGE PROCTOSCOPY;  Surgeon: Karie Soda, MD;  Location: [facility];   Service: General;  Laterality: N/A;   REMOVAL OF BILATERAL TISSUE EXPANDERS WITH PLACEMENT OF BILATERAL BREAST IMPLANTS Bilateral 04/16/2016   Procedure: REMOVAL OF BILATERAL TISSUE EXPANDERS WITH PLACEMENT OF BILATERAL BREAST IMPLANTS;  Surgeon: Glenna Fellows, MD;  Location: [facility];  Service: Plastics;  Laterality: Bilateral;   ROBOTIC ASSISTED LAP VAGINAL HYSTERECTOMY N/A 11/19/2014   Procedure: ROBOTIC  LYSIS OF ADHESIONS, CONVERTED TO LAPAROTOMY RADICAL UPPER VAGINECTOMY,LOW ANTERIOR BOWEL RESECTION, COLOSTOMY, BILATERAL URETERAL STENT PLACEMENT AND CYSTONOMY CLOSURE;  Surgeon: Adolphus Birchwood, MD;  Location: [facility];  Service: Gynecology;  Laterality: N/A;   TISSUE EXPANDER FILLING Bilateral 12/26/2015   Procedure: EXPANSION OF BILATERAL CHEST TISSUE EXPANDERS (60 mL- Right; 75 mL- Left);  Surgeon: Glenna Fellows, MD;  Location: [facility];  Service: Plastics;  Laterality: Bilateral;   TONSILLECTOMY     TOTAL ABDOMINAL HYSTERECTOMY  March 2006   [facility]   and Bilateral Salpingoophorectomy/  staging for Ovarian cancer/  an   VULVAR LESION REMOVAL N/A 08/19/2021   Procedure: VULVAR LESION with biopsies;  Surgeon: Carver Fila, MD;  Location: [facility];  Service: Gynecology;  Laterality: N/A;   XI ROBOTIC ASSISTED LOWER ANTERIOR RESECTION N/A 04/01/2017   Procedure: XI ROBOTIC VS LAPAROSCOPIC COLOSTOMY TAKEDOWN WITH LYSIS OF ADHESIONS.;  Surgeon: Karie Soda, MD;  Location: [facility];  Service: General;  Laterality: N/A;  ERAS PATHWAY    Family History  Problem Relation Age of Onset   Cancer Mother 29       stomach ca   Hypertension Mother    Cancer Father 89       prostate ca   Diabetes Father    Heart disease Father        CABG   Hypertension Father    Hyperlipidemia Father    Obesity Father    Breast cancer Maternal Aunt        dx in her 67s   Lymphoma Paternal Aunt    Brain cancer Paternal Grandfather    Ovarian cancer Other     Diabetes Sister    Hypertension Brother y-10   Heart disease Brother        CABG   Diabetes Brother     Social History   Socioeconomic History   Marital status: Married    Spouse name: Clinical research associate   Number of children: 1   Years of education: Not on file   Highest education level: Not on file  Occupational History   Occupation: retired Charity fundraiser from [employer]    Comment: L&D Charity fundraiser - retired  Tobacco Use   Smoking status: Never    Passive exposure: Never   Smokeless tobacco: Never  Vaping Use   Vaping status: Never Used  Substance and Sexual Activity   Alcohol use: Yes    Comment: Occ   Drug use: No   Sexual activity: Not Currently  Other Topics Concern   Not on file  Social History Narrative   Exercise-- has not gotten back into it since cancer came back   Social Determinants of Health   Financial Resource Strain: Low Risk  (11/21/2017)   Received from [facility], [facility]   Overall Financial Resource Strain (CARDIA)    Difficulty of Paying Living Expenses: Not hard at all  Food Insecurity: Low Risk  (02/22/2023)   Received from [facility]   Hunger Vital Sign    Worried About Running Out of Food in the Last Year: Never true    Ran Out of Food in the Last Year: Never true  Transportation Needs: Not on file (02/22/2023)  Physical Activity: Insufficiently Active (11/21/2017)   Received [facility], [facility]   Exercise Vital Sign    Days of Exercise per Week: 3 days    Minutes of Exercise per Session: 30 min  Stress:  No Stress Concern Present (11/21/2017)   Received from [facility], [facility]   [employer] of Occupational Health - Occupational Stress Questionnaire    Feeling of Stress : Not at all  Social Connections: Moderately Integrated (11/21/2017)   Received from [facility], [facility]   Social Connection and  Isolation Panel [NHANES]    Frequency of Communication with Friends and Family: More than three times a week    Frequency of Social Gatherings with Friends and Family: Once a week    Attends Religious Services: More than 4 times per year    Active Member of [employer] or Organizations: No    Attends Banker Meetings: Never    Marital Status: Married    Current Medications:  Current Outpatient Medications:    aspirin EC 81 MG tablet, Take 1 tablet (81 mg total) by mouth daily. Swallow whole., Disp: 90 tablet, Rfl: 3   Biotin 5 MG TABS, Take 5 mg by mouth daily. , Disp: , Rfl:    calcium citrate-vitamin D (CITRACAL+D) 315-200 MG-UNIT tablet, Take 1 tablet by mouth daily., Disp: , Rfl:    Cholecalciferol (VITAMIN D3) 10000 UNITS capsule, Take 10,000 Units by mouth See admin instructions. Take 1 tablet by mouth 43times per week., Disp: , Rfl:    clobetasol (OLUX) 0.05 % topical foam, Apply topically 2 (two) times daily., Disp: 50 g, Rfl: 3   Continuous Blood Gluc Sensor (FREESTYLE LIBRE 3 SENSOR) MISC, Inject 1 sensor to the skin every 14 days for continuous glucose monitoring., Disp: , Rfl:    Cranberry-Vitamin C 15000-100 MG CAPS, Take by mouth daily. 1 in Morning and 1 in PM, Disp: , Rfl:    doxycycline (VIBRA-TABS) 100 MG tablet, Take 1 tablet (100 mg total) by mouth 2 (two) times daily for 10 days., Disp: 20 tablet, Rfl: 0   famotidine-calcium carbonate-magnesium hydroxide (PEPCID COMPLETE) 10-800-165 MG chewable tablet, Chew 1 tablet by mouth as needed., Disp: , Rfl:    ferrous sulfate 325 (65 FE) MG tablet, Take 1 tablet (325 mg total) by mouth at bedtime., Disp: 30 tablet, Rfl: 3   fluticasone (FLONASE) 50 MCG/ACT nasal spray, Place 2 sprays into both nostrils daily., Disp: 16 g, Rfl: 6   levothyroxine (SYNTHROID, LEVOTHROID) 150 MCG tablet, Take 1 tablet (150 mcg total) by mouth daily before breakfast., Disp: 30 tablet, Rfl: 1   loratadine (CLARITIN) 10 MG tablet, Take 10 mg by  mouth daily. , Disp: , Rfl:    moxifloxacin (VIGAMOX) 0.5 % ophthalmic solution, Place 1 drop into both eyes 3 (three) times daily., Disp: 3 mL, Rfl: 0   omega-3 acid ethyl esters (LOVAZA) 1 G capsule, Take 1 g by mouth 2 (two) times daily. , Disp: , Rfl:    Polyethyl Glycol-Propyl Glycol (SYSTANE OP), Place 1 drop into both eyes daily as needed (dry eyes). , Disp: , Rfl:    pregabalin (LYRICA) 50 MG capsule, Take 1 capsule (50 mg total) by mouth 2 (two) times daily., Disp: 60 capsule, Rfl: 5   Prenatal Vit-Fe Fumarate-FA (PRENATAL VITAMIN PO), Take 1 capsule by mouth daily. Takes prenatal because there are no dyes in it, Disp: , Rfl:    Probiotic Product (ALIGN PO), Take 1 capsule by mouth daily., Disp: , Rfl:    rosuvastatin (CRESTOR) 10 MG tablet, Take 10 mg by mouth every evening., Disp: , Rfl:    Saccharomyces boulardii (FLORASTOR PO), Take 1 capsule by mouth  at bedtime., Disp: , Rfl:    Tazarotene (ARAZLO) 0.045 % LOTN, Apply 1 application topically daily., Disp: 45 g, Rfl: 0   Tbo-Filgrastim (GRANIX) 480 MCG/0.8ML SOSY injection, 480 mcg subcutaneous injection every 7 days life long480 mcg subcutaneous injection every 7 days life long, Disp: 11.2 mL, Rfl: 11   tiZANidine (ZANAFLEX) 4 MG tablet, Take 0.5-1 tablets (2-4 mg total) by mouth every 8 (eight) hours as needed for muscle spasms., Disp: 180 tablet, Rfl: 3   XIFAXAN 550 MG TABS tablet, Take 550 mg by mouth as needed., Disp: , Rfl:    buPROPion (WELLBUTRIN) 100 MG tablet, Take 1 tablet (100 mg total) by mouth 2 (two) times daily., Disp: 180 tablet, Rfl: 0   MOUNJARO 5 MG/0.5ML Pen, Inject 5 mg into the skin once a week., Disp: 2 mL, Rfl: 0  Review of Systems: Denies appetite changes, fevers, chills, fatigue, unexplained weight changes. Denies hearing loss, neck lumps or masses, mouth sores, ringing in ears or voice changes. Denies cough or wheezing.  Denies shortness of breath. Denies chest pain or palpitations. Denies leg  swelling. Denies abdominal distention, pain, blood in stools, constipation, diarrhea, nausea, vomiting, or early satiety. Denies pain with intercourse, dysuria, frequency, hematuria or incontinence. Denies hot flashes, pelvic pain, vaginal bleeding or vaginal discharge.   Denies joint pain, back pain or muscle pain/cramps. Denies itching, rash, or wounds. Denies dizziness, headaches, numbness or seizures. Denies swollen lymph nodes or glands, denies easy bruising or bleeding. Denies anxiety, depression, confusion, or decreased concentration.  Physical Exam: BP (!) 104/45   Pulse 64   Temp 98.7 F (37.1 C)   Ht 5\' 2"  (1.575 m)   Wt 206 lb (93.4 kg)   SpO2 98%   BMI 37.68 kg/m  General: Alert, oriented, no acute distress. HEENT: Normocephalic, atraumatic, sclera anicteric. Chest: Clear to auscultation bilaterally.  No wheezes or rhonchi. Cardiovascular: Regular rate and rhythm, no murmurs. Abdomen: Obese, soft, nontender.  Normoactive bowel sounds.  Ostomy bag in each lower quadrant, urine in the urostomy bag.  I cannot see skin immediately around to the ostomies secondary to the bag.   Extremities: Grossly normal range of motion.  Warm, well perfused.  No edema bilaterally. Lymphatics: No cervical, supraclavicular, or inguinal adenopathy. GU: Normal appearing external female genitalia.  On speculum exam with a small speculum, vaginal mucosa is normal in appearance.  There is a ring of constricted tissue where the vagina narrows approximately 4 cm above the introitus, vaginal mucosa is healthy appearing.  Minimal discharge noted.  On bimanual exam, no masses or nodularity appreciated.  Narrowing of the vagina is palpated with cavity above this narrowing.    Laboratory & Radiologic Studies: None new  Assessment & Plan: [name] is a 67 y.o. woman with a history of recurrent endometrioid endometrial/ovarian cancer.   S/p exploratory laparotomy, posterior supralevator pelvic  exenteration, end colostomy, bladder repair, ureteral stenting with complete resection and negative margins on 11/09/14. S/p adjuvant radiation completed 03/10/15. Disease free since this time.   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   Status post pelvic exenteration procedure at [facility] in September 2020 with proctectomy and cystectomy and formation of permanent colostomy and ileal conduit.  Vaginectomy not performed.   Persistent vaginal drainage, sometimes resolves completely around the time of antibiotics.   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 No specific follow-up or surveillance recommended by genetics counselor.  Having some skin changes concerning for infection of the skin.   Plan: 1) Right lower quadrant pain and intermittent pSBO symptoms: I suspect symptoms are related to prior treatment (surgical adhesions). We discussed keeping a journal of food, medications, bowel function, and pain to help identify foods/medication changes that precipitate symptoms. CT does not show obvious reason for her pain but we reviewed again today that adhesions frequently at not visible on imaging. Any surgical intervention would be VERY morbid (the patient knows that I do not recommend this).   2) History of recurrent endometrial/ovarian cancer: s/p complete resection with negative margins and s/p adjuvant radiation. No chemotherapy (in the recurrent setting) due to chronic idiopathic neutropenia. She has a history of primary adjuvant chemotherapy after her initial diagnosis of stage III endometrial cancer in 2006. CT imaging and extensive pelvic surgical evaluation in 2020 demonstrated complete clinical response. Imaging in early 2022 with no definitive evidence of recurrent disease. CT  imaging most recently in role of 2024 negative for recurrent disease.   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 She has persistent vaginal drainage despite cystectomy and total proctectomy. She patient continues to have vaginal and rectal bleeding. She's been previously counseled that in the setting of vagina having been left in situ at the time of her exenteration in 04/2019, surgery to performed total vaginectomy and placement of myocutaneous flap would require another major surgery that would likely involve having her colostomy and ileal conduit having to be taken down and would be associated with significant morbidity.  Additionally there would be no guarantee that her tissues, severely damaged by radiation, would heal such as a second surgery and result in good outcomes. After the patient stopped her Eliquis, this significantly helped decrease her bleeding. Hyperbaric oxygen seem to help some also.  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  Had previously discussed with interventional radiology with recommendation against consideration of embolization or drain placement.  Findings continue to be stable on imaging, last CT scan in April.   4) Neutropenia - resolved.    5) Breast cancer - history of stage IIA. ER/PR positive. Treated with surgery and arimidex.     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 Patient follows-up with Dr Matthias Hughs for close colon cancer and upper GI cancer surveillance  7) Concern for soft tissue infection -this has responded well to doxycycline in the past.  New prescription sent to the patient's pharmacy.   From a cancer standpoint, I will continue to see her every 6 months.  22 minutes of total time was spent for this patient encounter, including preparation, face-to-face counseling  with the patient and coordination of care, and documentation of the encounter.  Eugene Garnet, MD  Division of Gynecologic Oncology  Department of Obstetrics and Gynecology  [facility]

## 2023-05-06 NOTE — Telephone Encounter (Signed)
Granix prior review/certification faxed to [facility] [place]. Fax confirmation received.

## 2023-05-09 DIAGNOSIS — N390 Urinary tract infection, site not specified: Secondary | ICD-10-CM | POA: Diagnosis not present

## 2023-05-09 DIAGNOSIS — N184 Chronic kidney disease, stage 4 (severe): Secondary | ICD-10-CM | POA: Diagnosis not present

## 2023-05-10 ENCOUNTER — Telehealth: Payer: Self-pay

## 2023-05-10 NOTE — Telephone Encounter (Signed)
Received a call from her husband that insurance still has not recieved the paper work needed from the office for [employer]. Re faxed Granix prior review/certification to [facility] [place] to [phone], received fax confirmation.  Left a message for husband that paper work faxed again to [facility] [place]. Called [place] and told her paper work faxed today. She verbalized understanding.

## 2023-05-10 NOTE — Telephone Encounter (Signed)
Called her back. Told her that the PA person did the PA CoverMyMeds again and it says duplicate. Pending PA exists so her insurance must be working on it. The paper work was re-faxed information again and asked that it be expedited. [name] verbalized understanding.

## 2023-05-11 ENCOUNTER — Telehealth: Payer: Self-pay

## 2023-05-11 NOTE — Telephone Encounter (Signed)
Called [place] regarding after hours call from her husband regarding sending Granix prescription to [facility]. She said to not send Rx since we are still waiting on PA.

## 2023-05-13 ENCOUNTER — Telehealth: Payer: Self-pay

## 2023-05-13 NOTE — Telephone Encounter (Signed)
Received call from Terrilee Croak Peer Reviewer for Pharmacy Utilization from [facility] [place]. Patient is requesting coverage for Granix 449mcg/0.8ml Soln Pref Syr. Patient gives herself injections at home and reported an intolerance to Zarxio one of the preferred drugs on 08/22/2019. Patient reported increased heart rate and body not feeling well/uneasy post injection. Per Terrilee Croak, Peer Reviewer, [employer] will not approve the Granix unless the Patient had a severe reaction to both Nivestym and Zarxio, the preferred medications, that required a visit to a medical facility (such as a hospital). A Food and Drug Administration (FDA) MedWatch form must be filled out by the Provider and sent in for review. Dr. Bertis Ruddy made aware of decision and Collaborative Nurse notified Patient.

## 2023-05-16 ENCOUNTER — Telehealth: Payer: Self-pay

## 2023-05-16 NOTE — Telephone Encounter (Signed)
Called her back. After reviewing her chart she has never taken Nivestym. She verbalized understanding  and said that her husband is calling her insurance to appeal the decision.

## 2023-05-16 NOTE — Telephone Encounter (Signed)
She called back and given below message. She verbalized understanding and is upset. She will share info with her husband and then decide what to do.  She said that she had a reaction to [facility] also, increased heart rate and body not feeling well/uneasy post injection. The same reaction that she had to the Zarxio. This is not listed in her allergies.  Told her per call on Friday, [employer] will not approve the Granix unless the Patient had a severe reaction to both Nivestym and Zarxio, the preferred medications, that required a visit to a medical facility (such as a hospital). A Food and Drug Administration (FDA) MedWatch form must be filled out by the Provider and sent in for review.   She will call the office back for questions.

## 2023-05-16 NOTE — Telephone Encounter (Signed)
Called and left a message asking her to call the office back. [employer] denied [employer] and preferred drug is Nivestym due to intolerance to [employer]. Dr. Bertis Ruddy will send order for [facility] to her pharmacy. If she wants to appeal, she needs to call her insurance directly.

## 2023-05-18 ENCOUNTER — Telehealth: Payer: Self-pay | Admitting: Cardiology

## 2023-05-18 NOTE — Telephone Encounter (Signed)
Spoke to Pts husband. States his wife can not take the lower teir medication that insurance is requiring her to change to. Fayrene Fearing states both medications caused a reaction of SOB and heart palpations. (Please see Oncology notes) This medication is a medication prescribed by oncology. Advised to speak with oncology office to help with this matter but he stated he just keeps being sent to [facility].  (Notes from Oncology office in Mychart) Fayrene Fearing stated [employer] needed documents about medication. Charlott Holler again to call oncology office and they would contact us if they needed cardiology. Fayrene Fearing stated understanding.

## 2023-05-18 NOTE — Telephone Encounter (Signed)
Pt c/o medication issue:  1. Name of Medication: Tbo-Filgrastim (GRANIX) 480 MCG/0.8ML SOSY injection   2. How are you currently taking this medication (dosage and times per day)?   480 mcg subcutaneous injection every 7 days life long480 mcg subcutaneous injection every 7 days life long    3. Are you having a reaction (difficulty breathing--STAT)? No  4. What is your medication issue? Pt's husband would like to speak with the nurse about this medication because the insurance is trying to change the medication to a generic. Please advise

## 2023-05-19 ENCOUNTER — Telehealth: Payer: Self-pay

## 2023-05-19 ENCOUNTER — Encounter: Payer: Self-pay | Admitting: Hematology and Oncology

## 2023-05-19 ENCOUNTER — Other Ambulatory Visit: Payer: Self-pay | Admitting: Hematology and Oncology

## 2023-05-19 MED ORDER — NIVESTYM 480 MCG/0.8ML IJ SOSY: 480.0000 ug | PREFILLED_SYRINGE | INTRAMUSCULAR | 11 refills | Status: DC

## 2023-05-19 NOTE — Telephone Encounter (Signed)
Husband called about appeal paper work for [employer]. He said do not work on appeal for the [employer].  Chaunte has agreed to [employer] and ask that Dr. Bertis Ruddy send Rx to pharmacy. Forwarded message to Dr. Bertis Ruddy and she will send Rx.

## 2023-05-20 ENCOUNTER — Other Ambulatory Visit: Payer: Self-pay

## 2023-05-20 MED ORDER — NIVESTYM 480 MCG/0.8ML IJ SOSY: 480.0000 ug | PREFILLED_SYRINGE | INTRAMUSCULAR | 11 refills | Status: DC

## 2023-05-20 NOTE — Telephone Encounter (Signed)
Left message that Nivestym injection was sent to [facility] pharmacy in [place], left pharmacy #. Ask him to call the office back for questions.

## 2023-05-23 DIAGNOSIS — Z8542 Personal history of malignant neoplasm of other parts of uterus: Secondary | ICD-10-CM | POA: Diagnosis not present

## 2023-05-23 DIAGNOSIS — N132 Hydronephrosis with renal and ureteral calculous obstruction: Secondary | ICD-10-CM | POA: Diagnosis not present

## 2023-05-27 DIAGNOSIS — N2889 Other specified disorders of kidney and ureter: Secondary | ICD-10-CM | POA: Diagnosis not present

## 2023-05-27 DIAGNOSIS — N390 Urinary tract infection, site not specified: Secondary | ICD-10-CM | POA: Diagnosis not present

## 2023-06-01 ENCOUNTER — Ambulatory Visit (INDEPENDENT_AMBULATORY_CARE_PROVIDER_SITE_OTHER): Payer: BC Managed Care – PPO | Admitting: Family Medicine

## 2023-06-01 ENCOUNTER — Encounter (INDEPENDENT_AMBULATORY_CARE_PROVIDER_SITE_OTHER): Payer: Self-pay | Admitting: Family Medicine

## 2023-06-01 VITALS — BP 127/76 | HR 63 | Temp 97.9°F | Ht 61.0 in | Wt 203.0 lb

## 2023-06-01 DIAGNOSIS — E1169 Type 2 diabetes mellitus with other specified complication: Secondary | ICD-10-CM | POA: Diagnosis not present

## 2023-06-01 DIAGNOSIS — F5089 Other specified eating disorder: Secondary | ICD-10-CM | POA: Diagnosis not present

## 2023-06-01 DIAGNOSIS — E669 Obesity, unspecified: Secondary | ICD-10-CM | POA: Diagnosis not present

## 2023-06-01 DIAGNOSIS — Z6838 Body mass index (BMI) 38.0-38.9, adult: Secondary | ICD-10-CM

## 2023-06-01 DIAGNOSIS — Z7985 Long-term (current) use of injectable non-insulin antidiabetic drugs: Secondary | ICD-10-CM

## 2023-06-01 DIAGNOSIS — J3089 Other allergic rhinitis: Secondary | ICD-10-CM | POA: Diagnosis not present

## 2023-06-01 DIAGNOSIS — J309 Allergic rhinitis, unspecified: Secondary | ICD-10-CM | POA: Insufficient documentation

## 2023-06-01 DIAGNOSIS — F3289 Other specified depressive episodes: Secondary | ICD-10-CM

## 2023-06-01 MED ORDER — MOUNJARO 5 MG/0.5ML ~~LOC~~ SOAJ
5.0000 mg | SUBCUTANEOUS | 0 refills | Status: DC
Start: 2023-06-01 — End: 2023-07-12

## 2023-06-01 MED ORDER — BUPROPION HCL 100 MG PO TABS: 100.0000 mg | ORAL_TABLET | Freq: Two times a day (BID) | ORAL | 0 refills | Status: DC

## 2023-06-01 MED ORDER — FLUTICASONE PROPIONATE 50 MCG/ACT NA SUSP
2.0000 | Freq: Every day | NASAL | 0 refills | Status: DC
Start: 2023-06-01 — End: 2023-12-12

## 2023-06-01 NOTE — Progress Notes (Signed)
Chief Complaint:   OBESITY [name] is here to discuss her progress with her obesity treatment plan along with follow-up of her obesity related diagnoses. [name] is on keeping a food journal and adhering to recommended goals of 1300 calories and 80+ grams of protein and states she is following her eating plan approximately 70% of the time. [name] states she is walking for 30 minutes 4 times per week.  Today's visit was #: 73 Starting weight: 208 lbs Starting date: 04/05/2018 Today's weight: 203 lbs Today's date: 06/01/2023 Total lbs lost to date: 5 Total lbs lost since last in-office visit: 0  Interim History: Patient has been dealing with a lot of health issues and has not been able to concentrate as much on her weight loss efforts.  Subjective:   1. Seasonal allergic rhinitis due to other allergic trigger Patient is stable on Flonase and she asks for refill to be sent to [facility].  2. Type 2 diabetes mellitus with other specified complication, unspecified whether long term insulin use (HCC) Patient's recent A1c was 6.1.  She continues to work on her diet.  3. Emotional Eating Behavior Patient is under a lot of stress with her health.  She is working on Pensions consultant eating behavior.  Assessment/Plan:   1. Seasonal allergic rhinitis due to other allergic trigger We will refill Flonase 50 mcg with a 90-day supply, and will send to [facility].  - fluticasone (FLONASE) 50 MCG/ACT nasal spray; Place 2 sprays into both nostrils daily.  Dispense: 16 g; Refill: 0  2. Type 2 diabetes mellitus with other specified complication, unspecified whether long term insulin use (HCC) Patient will continue with her diet and Mounjaro, and we will refill Mounjaro 5 mg once weekly for 90 days.  - MOUNJARO 5 MG/0.5ML Pen; Inject 5 mg into the skin once a week.  Dispense: 6 mL; Refill: 0  3. Emotional Eating Behavior Patient will continue Wellbutrin 100 mg twice daily, and we will  refill for 90 days.  - buPROPion (WELLBUTRIN) 100 MG tablet; Take 1 tablet (100 mg total) by mouth 2 (two) times daily.  Dispense: 180 tablet; Refill: 0  4. BMI 38.0-38.9,adult  5. Obesity, Beginning BMI 38.04 [name] is currently in the action stage of change. As such, her goal is to continue with weight loss efforts. She has agreed to keeping a food journal and adhering to recommended goals of 1300 calories and 80+ grams of protein daily.   Exercise goals: As is.   Behavioral modification strategies: increasing lean protein intake.  [name] has agreed to follow-up with our clinic in 4 weeks. She was informed of the importance of frequent follow-up visits to maximize her success with intensive lifestyle modifications for her multiple health conditions.   Objective:   Blood pressure 127/76, pulse 63, temperature 97.9 F (36.6 C), height 5\' 1"  (1.549 m), weight 203 lb (92.1 kg), SpO2 100%. Body mass index is 38.36 kg/m.  Lab Results  Component Value Date   CREATININE 1.84 (H) 02/17/2023   BUN 30 (H) 02/17/2023   NA 138 02/17/2023   K 4.1 02/17/2023   CL 105 02/17/2023   CO2 25 02/17/2023   Lab Results  Component Value Date   ALT 33 02/17/2023   AST 16 02/17/2023   ALKPHOS 200 (H) 02/17/2023   BILITOT 0.4 02/17/2023   Lab Results  Component Value Date   HGBA1C 6.1 02/15/2022   HGBA1C 6.0 07/23/2021   HGBA1C 6.3 (H) 06/16/2020  HGBA1C 7.9 (H) 09/01/2017   HGBA1C 5.3 04/01/2017   Lab Results  Component Value Date   INSULIN 14.1 06/16/2020   Lab Results  Component Value Date   TSH 2.27 02/17/2023   Lab Results  Component Value Date   CHOL 77 02/17/2023   HDL 39.20 02/17/2023   LDLCALC 14 02/17/2023   TRIG 118.0 02/17/2023   CHOLHDL 2 02/17/2023   Lab Results  Component Value Date   VD25OH 36.40 02/17/2023   VD25OH 31.09 02/15/2022   VD25OH 25.9 (L) 06/16/2020   Lab Results  Component Value Date   WBC 6.6 02/17/2023   HGB 11.7 (L) 02/17/2023   HCT 36.8  02/17/2023   MCV 91.4 02/17/2023   PLT 242.0 02/17/2023   Lab Results  Component Value Date   IRON 7 (L) 08/31/2017   TIBC 164 (L) 08/31/2017   FERRITIN 27 11/29/2014   Attestation Statements:   Reviewed by clinician on day of visit: allergies, medications, problem list, medical history, surgical history, family history, social history, and previous encounter notes.   I, Burt Knack, am acting as transcriptionist for Quillian Quince, MD.  I have reviewed the above documentation for accuracy and completeness, and I agree with the above. -  Quillian Quince, MD

## 2023-06-08 DIAGNOSIS — Z9013 Acquired absence of bilateral breasts and nipples: Secondary | ICD-10-CM | POA: Diagnosis not present

## 2023-06-08 DIAGNOSIS — Z853 Personal history of malignant neoplasm of breast: Secondary | ICD-10-CM | POA: Diagnosis not present

## 2023-06-09 DIAGNOSIS — L821 Other seborrheic keratosis: Secondary | ICD-10-CM | POA: Diagnosis not present

## 2023-06-09 DIAGNOSIS — L570 Actinic keratosis: Secondary | ICD-10-CM | POA: Diagnosis not present

## 2023-06-09 DIAGNOSIS — D2271 Melanocytic nevi of right lower limb, including hip: Secondary | ICD-10-CM | POA: Diagnosis not present

## 2023-06-09 DIAGNOSIS — D225 Melanocytic nevi of trunk: Secondary | ICD-10-CM | POA: Diagnosis not present

## 2023-06-09 DIAGNOSIS — D224 Melanocytic nevi of scalp and neck: Secondary | ICD-10-CM | POA: Diagnosis not present

## 2023-06-20 DIAGNOSIS — E039 Hypothyroidism, unspecified: Secondary | ICD-10-CM | POA: Diagnosis not present

## 2023-06-20 DIAGNOSIS — N2889 Other specified disorders of kidney and ureter: Secondary | ICD-10-CM | POA: Diagnosis not present

## 2023-06-20 DIAGNOSIS — Z853 Personal history of malignant neoplasm of breast: Secondary | ICD-10-CM | POA: Diagnosis not present

## 2023-06-20 DIAGNOSIS — I129 Hypertensive chronic kidney disease with stage 1 through stage 4 chronic kidney disease, or unspecified chronic kidney disease: Secondary | ICD-10-CM | POA: Diagnosis not present

## 2023-06-20 DIAGNOSIS — N131 Hydronephrosis with ureteral stricture, not elsewhere classified: Secondary | ICD-10-CM | POA: Diagnosis not present

## 2023-06-20 DIAGNOSIS — E785 Hyperlipidemia, unspecified: Secondary | ICD-10-CM | POA: Diagnosis not present

## 2023-06-20 DIAGNOSIS — E1122 Type 2 diabetes mellitus with diabetic chronic kidney disease: Secondary | ICD-10-CM | POA: Diagnosis not present

## 2023-06-20 DIAGNOSIS — Z8543 Personal history of malignant neoplasm of ovary: Secondary | ICD-10-CM | POA: Diagnosis not present

## 2023-06-20 DIAGNOSIS — N139 Obstructive and reflux uropathy, unspecified: Secondary | ICD-10-CM | POA: Diagnosis not present

## 2023-06-20 DIAGNOSIS — N184 Chronic kidney disease, stage 4 (severe): Secondary | ICD-10-CM | POA: Diagnosis not present

## 2023-06-27 DIAGNOSIS — N2889 Other specified disorders of kidney and ureter: Secondary | ICD-10-CM | POA: Diagnosis not present

## 2023-06-27 DIAGNOSIS — N390 Urinary tract infection, site not specified: Secondary | ICD-10-CM | POA: Diagnosis not present

## 2023-06-28 DIAGNOSIS — N2889 Other specified disorders of kidney and ureter: Secondary | ICD-10-CM | POA: Diagnosis not present

## 2023-06-28 DIAGNOSIS — N390 Urinary tract infection, site not specified: Secondary | ICD-10-CM | POA: Diagnosis not present

## 2023-07-05 DIAGNOSIS — N2889 Other specified disorders of kidney and ureter: Secondary | ICD-10-CM | POA: Diagnosis not present

## 2023-07-07 DIAGNOSIS — E119 Type 2 diabetes mellitus without complications: Secondary | ICD-10-CM | POA: Diagnosis not present

## 2023-07-07 DIAGNOSIS — E785 Hyperlipidemia, unspecified: Secondary | ICD-10-CM | POA: Diagnosis not present

## 2023-07-07 DIAGNOSIS — I10 Essential (primary) hypertension: Secondary | ICD-10-CM | POA: Diagnosis not present

## 2023-07-07 DIAGNOSIS — N1832 Chronic kidney disease, stage 3b: Secondary | ICD-10-CM | POA: Diagnosis not present

## 2023-07-11 ENCOUNTER — Other Ambulatory Visit (INDEPENDENT_AMBULATORY_CARE_PROVIDER_SITE_OTHER): Payer: Self-pay | Admitting: Family Medicine

## 2023-07-11 DIAGNOSIS — J3089 Other allergic rhinitis: Secondary | ICD-10-CM

## 2023-07-12 ENCOUNTER — Ambulatory Visit (INDEPENDENT_AMBULATORY_CARE_PROVIDER_SITE_OTHER): Payer: Medicare Other | Admitting: Family Medicine

## 2023-07-12 ENCOUNTER — Encounter (INDEPENDENT_AMBULATORY_CARE_PROVIDER_SITE_OTHER): Payer: Self-pay | Admitting: Family Medicine

## 2023-07-12 VITALS — BP 129/72 | HR 71 | Temp 99.2°F | Ht 61.0 in | Wt 208.0 lb

## 2023-07-12 DIAGNOSIS — E119 Type 2 diabetes mellitus without complications: Secondary | ICD-10-CM | POA: Diagnosis not present

## 2023-07-12 DIAGNOSIS — B379 Candidiasis, unspecified: Secondary | ICD-10-CM | POA: Diagnosis not present

## 2023-07-12 DIAGNOSIS — Z6839 Body mass index (BMI) 39.0-39.9, adult: Secondary | ICD-10-CM

## 2023-07-12 DIAGNOSIS — F3289 Other specified depressive episodes: Secondary | ICD-10-CM

## 2023-07-12 DIAGNOSIS — E1169 Type 2 diabetes mellitus with other specified complication: Secondary | ICD-10-CM

## 2023-07-12 DIAGNOSIS — E669 Obesity, unspecified: Secondary | ICD-10-CM

## 2023-07-12 DIAGNOSIS — Z7985 Long-term (current) use of injectable non-insulin antidiabetic drugs: Secondary | ICD-10-CM

## 2023-07-12 MED ORDER — MOUNJARO 5 MG/0.5ML ~~LOC~~ SOAJ
5.0000 mg | SUBCUTANEOUS | 0 refills | Status: DC
Start: 2023-07-12 — End: 2023-09-08

## 2023-07-12 NOTE — Progress Notes (Signed)
.sm[facility]  Office: [phone]  /  Fax: [phone]  WEIGHT SUMMARY AND BIOMETRICS  Anthropometric Measurements Height: 5\' 1"  (1.549 m) Weight: 208 lb (94.3 kg) BMI (Calculated): 39.32 Weight at Last Visit: 203 lb Weight Lost Since Last Visit: 0 Weight Gained Since Last Visit: 5 lb Starting Weight: 20 Total Weight Loss (lbs): 0 lb (0 kg) Peak Weight: 300 lb   Body Composition  Body Fat %: 46.1 % Fat Mass (lbs): 96.2 lbs Muscle Mass (lbs): 106.8 lbs Total Body Water (lbs): 79.4 lbs Visceral Fat Rating : 15   Other Clinical Data Fasting: no Labs: no Today's Visit #: 29 Starting Date: 04/05/18    Chief Complaint: OBESITY   History of Present Illness   The patient, with a history of type 2 diabetes and obesity, presents for a follow-up consultation. She has been on Mounjaro 5mg  and has been attempting to maintain a healthier diet, targeting around 1300 calories and 80 or more grams of protein per day. However, she reports struggling with dietary adherence, particularly over the holiday period, and has gained five pounds over the last six weeks. She estimates her dietary success rate at about 50% of the time. She is currently not engaging in any exercise.  In addition to her diabetes and obesity, the patient has a history of nephrostomy tubes. She reports that the procedure went well, but in the last couple of days, she has noticed increased redness and soreness at the site, to the point of needing hydrocodone for sleep. She suspects an infection may be brewing at the site. She is scheduled for a blood procedure in the coming week, which she understands will involve going through the nephrostomy tube.  The patient also reports a significant amount of back pain, which has limited her ability to engage in physical activities, including walking. She expresses frustration with her lack of progress in managing her weight and health conditions. Despite these challenges, the patient  remains active in her community, attending various social events and maintaining a busy schedule.          PHYSICAL EXAM:  Blood pressure 129/72, pulse 71, temperature 99.2 F (37.3 C), height 5\' 1"  (1.549 m), weight 208 lb (94.3 kg), SpO2 (!) 69%. Body mass index is 39.3 kg/m.  DIAGNOSTIC DATA REVIEWED:  BMET    Component Value Date/Time   NA 138 02/17/2023 0948   NA 141 01/11/2022 1544   NA 141 01/24/2017 1228   K 4.1 02/17/2023 0948   K 4.6 01/24/2017 1228   CL 105 02/17/2023 0948   CO2 25 02/17/2023 0948   CO2 29 01/24/2017 1228   GLUCOSE 123 (H) 02/17/2023 0948   GLUCOSE 84 01/24/2017 1228   BUN 30 (H) 02/17/2023 0948   BUN 29 (H) 01/11/2022 1544   BUN 22.2 01/24/2017 1228   CREATININE 1.84 (H) 02/17/2023 0948   CREATININE 2.20 (H) 06/04/2022 1450   CREATININE 1.91 (H) 01/18/2020 1440   CREATININE 0.8 01/24/2017 1228   CALCIUM 9.2 02/17/2023 0948   CALCIUM 10.2 01/24/2017 1228   GFRNONAA 24 (L) 06/04/2022 1450   GFRNONAA 78 12/16/2014 1530   GFRAA 38 (L) 06/16/2020 0834   GFRAA 30 (L) 03/27/2018 1324   GFRAA >89 12/16/2014 1530   Lab Results  Component Value Date   HGBA1C 6.1 02/15/2022   HGBA1C 6.3 (H) 11/07/2014   Lab Results  Component Value Date   INSULIN 14.1 06/16/2020   Lab Results  Component Value Date   TSH 2.27 02/17/2023  CBC    Component Value Date/Time   WBC 6.6 02/17/2023 0948   RBC 4.02 02/17/2023 0948   HGB 11.7 (L) 02/17/2023 0948   HGB 11.3 (L) 06/04/2022 1450   HGB 11.5 01/11/2022 1544   HGB 12.4 07/29/2017 1444   HCT 36.8 02/17/2023 0948   HCT 35.5 01/11/2022 1544   HCT 38.0 07/29/2017 1444   PLT 242.0 02/17/2023 0948   PLT 184 06/04/2022 1450   PLT 207 01/11/2022 1544   MCV 91.4 02/17/2023 0948   MCV 94 01/11/2022 1544   MCV 90.9 07/29/2017 1444   MCH 30.3 10/04/2022 1152   MCHC 31.9 02/17/2023 0948   RDW 14.4 02/17/2023 0948   RDW 13.1 01/11/2022 1544   RDW 15.5 (H) 07/29/2017 1444   Iron Studies    Component  Value Date/Time   IRON 7 (L) 08/31/2017 0451   IRON 12 (L) 11/29/2014 1251   TIBC 164 (L) 08/31/2017 0451   TIBC 331 11/29/2014 1251   FERRITIN 27 11/29/2014 1251   IRONPCTSAT 4 (L) 08/31/2017 0451   IRONPCTSAT 4 (L) 11/29/2014 1251   Lipid Panel     Component Value Date/Time   CHOL 77 02/17/2023 0948   CHOL 92 (L) 06/16/2020 0834   TRIG 118.0 02/17/2023 0948   HDL 39.20 02/17/2023 0948   HDL 49 06/16/2020 0834   CHOLHDL 2 02/17/2023 0948   VLDL 23.6 02/17/2023 0948   LDLCALC 14 02/17/2023 0948   LDLCALC 21 06/16/2020 0834   LDLCALC 13 01/18/2020 1440   Hepatic Function Panel     Component Value Date/Time   PROT 7.3 02/17/2023 0948   PROT 7.8 06/16/2020 0834   PROT 6.9 01/24/2017 1228   ALBUMIN 3.7 02/17/2023 0948   ALBUMIN 4.4 06/16/2020 0834   ALBUMIN 3.4 (L) 01/24/2017 1228   AST 16 02/17/2023 0948   AST 12 (L) 06/04/2022 1450   AST 16 01/24/2017 1228   ALT 33 02/17/2023 0948   ALT 14 06/04/2022 1450   ALT 14 01/24/2017 1228   ALKPHOS 200 (H) 02/17/2023 0948   ALKPHOS 90 01/24/2017 1228   BILITOT 0.4 02/17/2023 0948   BILITOT 0.6 06/04/2022 1450   BILITOT 0.34 01/24/2017 1228      Component Value Date/Time   TSH 2.27 02/17/2023 0948   Nutritional Lab Results  Component Value Date   VD25OH 36.40 02/17/2023   VD25OH 31.09 02/15/2022   VD25OH 25.9 (L) 06/16/2020     Assessment and Plan    Possible Nephrostomy Tube Infection Reports redness and soreness at the nephrostomy tube site, requiring hydrocodone for pain relief. Suspects infection; scheduled procedure on Thursday. Monitoring temperature, feeling flushed but afebrile. Discussed importance of monitoring for infection signs and considering antibiotics if necessary. - Call nephrology office today to report symptoms - Send picture of the site to the provider if possible - Monitor for infection signs and consider antibiotics if necessary  Type 2 Diabetes Mellitus On Mounjaro 5 mg. Struggled with  dietary adherence over holidays, resulting in a 5-pound weight gain. Attempting 1300 calories and 80 grams of protein daily, successful 50% of the time. No exercise regimen. Discussed importance of dietary adherence and regular exercise for glycemic control and weight management. - Continue Mounjaro 5 mg, refilled today - Encourage dietary adherence - Incorporate regular exercise  Obesity Gained 5 pounds over six weeks due to holiday eating and inactivity. Reports difficulty bending and increased back pain, contributing to inactivity. Discussed weight management strategies and benefits of physical activity. Consider referral  to dietitian or weight management program. - Encourage weight management strategies - Discuss benefits of physical activity - Consider referral to dietitian or weight management program    Depression Pt frustrated with her ongoing health issues but is working on handling the stress in a positive fashion - Refill bupropion 100 mg twice daily   Candidiasis pt doing well on Nystatin and requests a rf today - Refill nystatin powder for candidiasis  Follow-up - Schedule follow-up appointment for February.         I have personally spent 40 minutes total time today in preparation, patient care, and documentation for this visit, including the following: review of clinical lab tests; review of medical tests/procedures/services.    She was informed of the importance of frequent follow up visits to maximize her success with intensive lifestyle modifications for her multiple health conditions.    Quillian Quince, MD

## 2023-07-14 DIAGNOSIS — N390 Urinary tract infection, site not specified: Secondary | ICD-10-CM | POA: Diagnosis not present

## 2023-07-14 DIAGNOSIS — D3500 Benign neoplasm of unspecified adrenal gland: Secondary | ICD-10-CM | POA: Diagnosis not present

## 2023-07-14 DIAGNOSIS — N2889 Other specified disorders of kidney and ureter: Secondary | ICD-10-CM | POA: Diagnosis not present

## 2023-07-14 DIAGNOSIS — N289 Disorder of kidney and ureter, unspecified: Secondary | ICD-10-CM | POA: Diagnosis not present

## 2023-07-14 DIAGNOSIS — E119 Type 2 diabetes mellitus without complications: Secondary | ICD-10-CM | POA: Diagnosis not present

## 2023-07-14 DIAGNOSIS — Z936 Other artificial openings of urinary tract status: Secondary | ICD-10-CM | POA: Diagnosis not present

## 2023-07-14 DIAGNOSIS — H524 Presbyopia: Secondary | ICD-10-CM | POA: Diagnosis not present

## 2023-07-14 DIAGNOSIS — Z961 Presence of intraocular lens: Secondary | ICD-10-CM | POA: Diagnosis not present

## 2023-07-14 LAB — HM DIABETES EYE EXAM

## 2023-07-15 ENCOUNTER — Encounter: Payer: Self-pay | Admitting: *Deleted

## 2023-07-15 DIAGNOSIS — Z933 Colostomy status: Secondary | ICD-10-CM | POA: Diagnosis not present

## 2023-07-19 DIAGNOSIS — L89319 Pressure ulcer of right buttock, unspecified stage: Secondary | ICD-10-CM | POA: Diagnosis not present

## 2023-07-19 DIAGNOSIS — Z936 Other artificial openings of urinary tract status: Secondary | ICD-10-CM | POA: Diagnosis not present

## 2023-07-19 DIAGNOSIS — Z4801 Encounter for change or removal of surgical wound dressing: Secondary | ICD-10-CM | POA: Diagnosis not present

## 2023-07-19 DIAGNOSIS — Z933 Colostomy status: Secondary | ICD-10-CM | POA: Diagnosis not present

## 2023-07-20 DIAGNOSIS — Z4801 Encounter for change or removal of surgical wound dressing: Secondary | ICD-10-CM | POA: Diagnosis not present

## 2023-07-20 DIAGNOSIS — Z933 Colostomy status: Secondary | ICD-10-CM | POA: Diagnosis not present

## 2023-07-20 DIAGNOSIS — L89319 Pressure ulcer of right buttock, unspecified stage: Secondary | ICD-10-CM | POA: Diagnosis not present

## 2023-07-20 DIAGNOSIS — Z936 Other artificial openings of urinary tract status: Secondary | ICD-10-CM | POA: Diagnosis not present

## 2023-07-22 ENCOUNTER — Encounter: Payer: Self-pay | Admitting: Family Medicine

## 2023-07-22 NOTE — Telephone Encounter (Signed)
Abstract completed but no results found in the chart.  Care team updated and letter sent for eye exam notes.

## 2023-08-06 DIAGNOSIS — J019 Acute sinusitis, unspecified: Secondary | ICD-10-CM | POA: Diagnosis not present

## 2023-08-08 ENCOUNTER — Encounter: Payer: Self-pay | Admitting: Family Medicine

## 2023-08-08 ENCOUNTER — Ambulatory Visit: Payer: Self-pay | Admitting: Family Medicine

## 2023-08-08 ENCOUNTER — Ambulatory Visit (INDEPENDENT_AMBULATORY_CARE_PROVIDER_SITE_OTHER): Payer: BC Managed Care – PPO | Admitting: Family Medicine

## 2023-08-08 VITALS — BP 132/80 | HR 90 | Temp 98.0°F | Resp 16 | Ht 61.0 in | Wt 205.6 lb

## 2023-08-08 DIAGNOSIS — J189 Pneumonia, unspecified organism: Secondary | ICD-10-CM | POA: Diagnosis not present

## 2023-08-08 MED ORDER — AZITHROMYCIN 250 MG PO TABS
ORAL_TABLET | ORAL | 0 refills | Status: DC
Start: 2023-08-08 — End: 2023-08-22

## 2023-08-08 MED ORDER — HYDROCODONE BIT-HOMATROP MBR 5-1.5 MG/5ML PO SOLN: 5.0000 mL | Freq: Four times a day (QID) | ORAL | 0 refills | Status: DC | PRN

## 2023-08-08 NOTE — Patient Instructions (Addendum)
Continue to push fluids, practice good hand hygiene, and cover your mouth if you cough.  If you start having fevers, shaking or shortness of breath, seek immediate care.  OK to take Tylenol 1000 mg (2 extra strength tabs) or 975 mg (3 regular strength tabs) every 6 hours as needed.  Do not drink alcohol, do any illicit/street drugs, drive or do anything that requires alertness while on this cough medicine.  Let us know if you need anything.

## 2023-08-08 NOTE — Telephone Encounter (Signed)
 Appt scheduled

## 2023-08-08 NOTE — Progress Notes (Signed)
 Chief Complaint  Patient presents with   Cough    Cough and congestion    [name] here for URI complaints.  Duration: 8 days  Associated symptoms: Fever (103 F max), rhinorrhea, ear fullness, shortness of breath, myalgia, and coughing, fatigue Denies: sinus congestion, sinus pain, itchy watery eyes, ear pain, ear drainage, sore throat, and wheezing Treatment to date: Doxycycline  for 2 d, cough drops Does well on cough syrup rx'd.  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(TAH w/ BSO) :  recurrent 11-19-2014 post pelvic surgery and radiation 01-29-2015 to 03-10-2015   Chronic idiopathic neutropenia (HCC)    presumed related to chemotherapy March 2006--- followed by dr lonn (treatment w/ G-CSF injections   Chronic nausea    CKD (chronic kidney disease), stage III [facility])    nephrologist-  dr almarie bonine   Difficult intravenous access    small veins--- hx PICC lines   DM type 2 (diabetes mellitus, type 2) (HCC)    endocriologist--  dr norman sebastian   (08-18-2021  per pt checks blood sugar daily in am,  fasting sugar--92--130)   Dysrhythmia    Environmental and seasonal allergies    Generalized muscle weakness    GERD (gastroesophageal reflux disease)    Hiatal hernia    History of abdominal abscess 04/16/2017   post surgery 04-01-2017  --- resolved 10/ 2018   History of COVID-19 05/2021   per pt mild symptoms that resolved    History of gastric polyp    2014  duodenum   History of ileus 04/16/2017   resolved w/ no surgical intervention   History of radiation therapy    01-29-2015 to 03-10-2015  pelvis 50.4Gy   History of small bowel obstruction 01/2019   Hypothyroidism    monitored by dr ozell altheimer   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 Eletha   PAF (paroxysmal atrial fibrillation) [facility])    cardiologist--- dr h. 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   Vitamin D  deficiency    Vulval lesion    Wears glasses     Objective BP 132/80   Pulse 90   Temp 98 F (36.7 C) (Oral)   Resp 16   Ht 5' 1 (1.549 m)   Wt 205 lb 9.6 oz (93.3 kg)   SpO2 98%   BMI 38.85 kg/m  General: Awake, alert, appears stated age HEENT: AT, [place], ears patent b/l  and TM neg on L, serous fluid on R, nares patent w/o discharge, pharynx pink and without exudates, MMM, no sinus TTP Neck: No masses or asymmetry Heart: RRR Lungs: Rales heard in the left lower lung field, clear otherwise, no wheezing, no accessory muscle use Psych: Age appropriate judgment and insight, normal mood and affect  Community acquired pneumonia of left lower lobe of lung - Plan: azithromycin  (ZITHROMAX ) 250 MG tablet, HYDROcodone  bit-homatropine (HYCODAN) 5-1.5 MG/5ML syrup  Stop doxy. Start Zpak and syrup. If no better in 2 d, send me a message and we will ck a XR at [facility]. Continue to push fluids, practice good hand hygiene, cover mouth when coughing. F/u prn. If starting to experience  fevers, shaking, or shortness of breath, seek immediate care. Pt voiced understanding and agreement to the plan.  Mabel [place], DO 08/08/23 3:43 PM

## 2023-08-08 NOTE — Telephone Encounter (Signed)
 Copied from CRM [id number]. Topic: Clinical - Red Word Triage >> Aug 08, 2023  9:16 AM [name] wrote: Red Word that prompted transfer to Nurse Triage: patient has a dry cough and chest tightness she has been sick for two weeks  Chief Complaint: cough, weakness Symptoms: cough, weakness , fever was 103.8 now 99.8 with tylenol , congestion Frequency: 1 week and getting worse Pertinent Negatives: Patient denies chest paibn Disposition: [] ED /[] Urgent Care (no appt availability in office) / [x] Appointment(In office/virtual)/ []  [facility]/ [] Home Care/ [] Refused Recommended Disposition /[]  Mobile Bus/ []  Follow-up with PCP Additional Notes: started doxycyline on Saturday and still taking but not getting any better.  Pt states very congested, having coughing spells, weakness all over. In office appointment made for 08/08/2023 with another provider within PCP office.  Reason for Disposition  Fever present > 3 days (72 hours)  Answer Assessment - Initial Assessment Questions 1. ONSET: When did the nasal discharge start?      1 week 2. AMOUNT: How much discharge is there?      N/a started doxycyline on Saturday and still taking but not getting any better 3. COUGH: Do you have a cough? If Yes, ask: Describe the color of your sputum (clear, white, yellow, green)     Cough so much at times that vomit 4. RESPIRATORY DISTRESS: Describe your breathing. ' Some SOB      5. FEVER: Do you have a fever? If Yes, ask: What is your temperature, how was it measured, and when did it start?     Was 103.8 now 99.8 with tylenol  6. SEVERITY: Overall, how bad are you feeling right now? (e.g., doesn't interfere with normal activities, staying home from school/work, staying in bed)      Tired, weakness,  7. OTHER SYMPTOMS: Do you have any other symptoms? (e.g., sore throat, earache, wheezing, vomiting)     Cough- sometimes productive, chest tightness, SOB at times, weakness 8.  PREGNANCY: Is there any chance you are pregnant? When was your last menstrual period?     N/a  Protocols used: Common Cold-A-AH

## 2023-08-10 ENCOUNTER — Ambulatory Visit (INDEPENDENT_AMBULATORY_CARE_PROVIDER_SITE_OTHER): Payer: Medicare Other | Admitting: Family Medicine

## 2023-08-22 ENCOUNTER — Ambulatory Visit (HOSPITAL_BASED_OUTPATIENT_CLINIC_OR_DEPARTMENT_OTHER)
Admission: RE | Admit: 2023-08-22 | Discharge: 2023-08-22 | Disposition: A | Discharge status: Home / Self Care | Payer: BC Managed Care – PPO | Source: Ambulatory Visit | Attending: Family Medicine | Admitting: Family Medicine

## 2023-08-22 ENCOUNTER — Ambulatory Visit: Payer: BC Managed Care – PPO | Admitting: Family Medicine

## 2023-08-22 ENCOUNTER — Encounter: Payer: Self-pay | Admitting: Family Medicine

## 2023-08-22 VITALS — BP 120/62 | HR 75 | Temp 98.6°F | Resp 18 | Ht 61.0 in | Wt 204.6 lb

## 2023-08-22 DIAGNOSIS — E039 Hypothyroidism, unspecified: Secondary | ICD-10-CM

## 2023-08-22 DIAGNOSIS — N390 Urinary tract infection, site not specified: Secondary | ICD-10-CM

## 2023-08-22 DIAGNOSIS — I10 Essential (primary) hypertension: Secondary | ICD-10-CM

## 2023-08-22 DIAGNOSIS — E1169 Type 2 diabetes mellitus with other specified complication: Secondary | ICD-10-CM

## 2023-08-22 DIAGNOSIS — J189 Pneumonia, unspecified organism: Secondary | ICD-10-CM

## 2023-08-22 DIAGNOSIS — C569 Malignant neoplasm of unspecified ovary: Secondary | ICD-10-CM | POA: Diagnosis not present

## 2023-08-22 DIAGNOSIS — I480 Paroxysmal atrial fibrillation: Secondary | ICD-10-CM

## 2023-08-22 DIAGNOSIS — E559 Vitamin D deficiency, unspecified: Secondary | ICD-10-CM

## 2023-08-22 DIAGNOSIS — E669 Obesity, unspecified: Secondary | ICD-10-CM

## 2023-08-22 DIAGNOSIS — E785 Hyperlipidemia, unspecified: Secondary | ICD-10-CM

## 2023-08-22 DIAGNOSIS — Z7985 Long-term (current) use of injectable non-insulin antidiabetic drugs: Secondary | ICD-10-CM

## 2023-08-22 DIAGNOSIS — C50919 Malignant neoplasm of unspecified site of unspecified female breast: Secondary | ICD-10-CM | POA: Diagnosis not present

## 2023-08-22 LAB — COMPREHENSIVE METABOLIC PANEL
ALT: 24 U/L (ref 0–35)
AST: 18 U/L (ref 0–37)
Albumin: 3.9 g/dL (ref 3.5–5.2)
Alkaline Phosphatase: 159 U/L — ABNORMAL HIGH (ref 39–117)
BUN: 31 mg/dL — ABNORMAL HIGH (ref 6–23)
CO2: 24 meq/L (ref 19–32)
Calcium: 9.5 mg/dL (ref 8.4–10.5)
Chloride: 102 meq/L (ref 96–112)
Creatinine, Ser: 1.73 mg/dL — ABNORMAL HIGH (ref 0.40–1.20)
GFR: 29.88 mL/min — ABNORMAL LOW (ref >60.00)
Glucose, Bld: 132 mg/dL — ABNORMAL HIGH (ref 70–99)
Potassium: 4.3 meq/L (ref 3.5–5.1)
Sodium: 138 meq/L (ref 135–145)
Total Bilirubin: 0.4 mg/dL (ref 0.2–1.2)
Total Protein: 7.5 g/dL (ref 6.0–8.3)

## 2023-08-22 LAB — CBC WITH DIFFERENTIAL/PLATELET
Basophils Absolute: 0.1 10*3/uL (ref 0.0–0.1)
Basophils Relative: 0.4 % (ref 0.0–3.0)
Eosinophils Absolute: 0.1 10*3/uL (ref 0.0–0.7)
Eosinophils Relative: 0.8 % (ref 0.0–5.0)
HCT: 38.3 % (ref 36.0–46.0)
Hemoglobin: 12.4 g/dL (ref 12.0–15.0)
Lymphocytes Relative: 3.4 % — ABNORMAL LOW (ref 12.0–46.0)
Lymphs Abs: 0.5 10*3/uL — ABNORMAL LOW (ref 0.7–4.0)
MCHC: 32.5 g/dL (ref 30.0–36.0)
MCV: 94.6 fL (ref 78.0–100.0)
Monocytes Absolute: 0.9 10*3/uL (ref 0.1–1.0)
Monocytes Relative: 6.2 % (ref 3.0–12.0)
Neutro Abs: 12.3 10*3/uL — ABNORMAL HIGH (ref 1.4–7.7)
Neutrophils Relative %: 89.2 % — ABNORMAL HIGH (ref 43.0–77.0)
Platelets: 256 10*3/uL (ref 150.0–400.0)
RBC: 4.05 Mil/uL (ref 3.87–5.11)
RDW: 14.7 % (ref 11.5–15.5)
WBC: 13.9 10*3/uL — ABNORMAL HIGH (ref 4.0–10.5)

## 2023-08-22 LAB — TSH: TSH: 1.23 u[IU]/mL (ref 0.35–5.50)

## 2023-08-22 LAB — VITAMIN D 25 HYDROXY (VIT D DEFICIENCY, FRACTURES): VITD: 44.37 ng/mL (ref 30.00–100.00)

## 2023-08-22 LAB — HEMOGLOBIN A1C: Hgb A1c MFr Bld: 6.4 % (ref 4.6–6.5)

## 2023-08-22 MED ORDER — DOXYCYCLINE HYCLATE 100 MG PO TABS: 100.0000 mg | ORAL_TABLET | Freq: Two times a day (BID) | ORAL | 0 refills | Status: DC

## 2023-08-22 NOTE — Progress Notes (Unsigned)
Established Patient Office Visit  Subjective   Patient ID: [name], female    DOB: [birth year]  Age: 68 y.o. MRN: [id number]  Chief Complaint  Patient presents with  . Diabetes  . Hyperlipidemia  . Follow-up    HPI Discussed the use of AI scribe software for clinical note transcription with the patient, who gave verbal consent to proceed.  History of Present Illness   The patient, with a history of immunocompromise, presented with a prolonged illness that began two days after Christmas. She initially sought care from a [facility] over a weekend due to severe symptoms and was prescribed an antibiotic. Despite this, her condition did not improve, and she subsequently consulted with Dr. Carmelia Roller, who suspected pneumonia. The patient was then prescribed a Z-Pak and cough syrup.  The patient reported a persistent cough, which was particularly triggered by deep breaths, and a clogged right ear, which was previously noted to have fluid. She also reported extreme fatigue and discomfort when eating, describing a sensation of inflamed or irritated intestines. The patient also mentioned a fever of 103.23F during the course of her illness.  In addition to these symptoms, the patient has been dealing with complications from nephrostomy tubes, which were placed due to complete blockage in the right kidney and partial blockage in the left kidney. The patient reported discomfort and rebound tenderness around the area of the tubes, as well as difficulty managing the urostomy bag. She also noted a change in the appearance of her urine, which had become cloudy and "gunky" looking.  The patient's current medication regimen includes Granix, which was recently switched to [facility]. She reported that Nivestym caused insomnia and increased heart rate. The patient also mentioned receiving the flu and RSV vaccines in October.  The patient's illness has significantly impacted her daily life, leading to the  cancellation of multiple appointments and a general lack of energy to leave the house. She expressed frustration with her current state of health and a strong desire to recover.      Patient Active Problem List   Diagnosis Date Noted  . Allergic rhinitis due to allergen 06/01/2023  . Stress 04/06/2023  . BMI 37.0-37.9, adult 12/30/2022  . Colovaginal fistula 12/13/2022  . BMI 38.0-38.9,adult 11/25/2022  . Abdominal pain, RLQ 10/28/2022  . Chronic renal impairment, stage 3b (HCC) 09/30/2022  . Presence of urostomy (HCC) 09/04/2022  . Serratia 09/04/2022  . BMI 40.0-44.9, adult (HCC) 09/02/2022  . Obesity, Beginning BMI 38.04 09/02/2022  . Dysuria 08/19/2022  . Type 2 diabetes mellitus with other specified complication (HCC) 03/22/2022  . Type 2 diabetes mellitus with stage 4 chronic kidney disease, without long-term current use of insulin (HCC) 12/10/2021  . Vaginal bleeding 12/10/2021  . Vulvar lesion 08/18/2021  . DM (diabetes mellitus) type II, controlled, with peripheral vascular disorder (HCC) 01/20/2021  . First degree heart block 01/18/2020  . Paroxysmal atrial fibrillation (HCC) 01/18/2020  . Swelling of right parotid gland 10/03/2019  . Vesicorectal fistula 02/26/2019  . Chronic pain   . Depression 01/16/2019  . Other chronic postprocedural pain 05/25/2018  . Recurrent UTI (urinary tract infection) 03/08/2018  . HLD (hyperlipidemia) 11/29/2017  . GERD (gastroesophageal reflux disease) 11/29/2017  . Type II diabetes mellitus with renal manifestations (HCC) 11/29/2017  . Preventative health care   . Poor venous access   . Acute renal failure superimposed on chronic kidney disease (HCC) 10/02/2017  . Decubitus ulcer of sacral region, stage 2 (HCC) 10/02/2017  . MDD (  major depressive disorder), single episode, moderate (HCC)   . Poorly controlled diabetes mellitus (HCC) 08/31/2017  . Pressure injury of skin 08/31/2017  . 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  History of external beam radiation therapy to pelvis 08/31/2017  . ARF (acute renal failure) (HCC) 08/30/2017  . Adjustment disorder with mixed anxiety and depressed mood 08/30/2017  . Dehydration 08/29/2017  . CKD (chronic kidney disease), stage III (HCC) 08/24/2017  . Wound of right buttock 08/10/2017  . 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HCC) 06/20/2017  . Postoperative anemia 04/04/2017  . 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 Vitamin D deficiency 08/30/2016  . Genetic testing 11/27/2015  . Breast cancer of upper-inner quadrant of left female breast (HCC)   . Pelvic pain in female 03/19/2015  . Chronic anemia 12/30/2014  . UTI (urinary tract infection)   . Pancytopenia, acquired (HCC) 11/26/2014  . Class 2 severe obesity with serious comorbidity and body mass index (BMI) of 38.0 to 38.9 in adult (HCC) 11/20/2014  . 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 Abdominal pain 09/09/2014  . Postmenopausal bleeding 09/05/2014  . History of ovarian & endometrial cancer 07/17/2012  . Chronic neutropenia (HCC) 12/14/2011  . Hypothyroidism 12/14/2011  . DM type 2 (diabetes mellitus, type 2) (HCC) 12/14/2011  . Benign essential HTN 12/14/2011   Past Medical History:  Diagnosis Date  . Anemia in chronic kidney disease   . Anticoagulant long-term use    eliquis--- managed by cardiology  . Benign essential HTN   . 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 Cancer of corpus uteri, except isthmus [facility]) 09/2004   oncologist-- dr rossi/  dr gorsuch:/   dx endometroid endometrial & ovarian cancer s/p  chemotheapy and surgery(TAH w/ BSO) :  recurrent 11-19-2014 post pelvic surgery and radiation 01-29-2015 to 03-10-2015  . Chronic idiopathic neutropenia (HCC)    presumed related to chemotherapy March 2006--- followed by dr Bertis Ruddy (treatment w/ G-CSF injections  . Chronic nausea   . CKD (chronic kidney disease), stage III [facility])    nephrologist-  dr Bufford Buttner  . Difficult intravenous access    small veins--- hx PICC lines  . DM type 2 (diabetes mellitus, type 2) (HCC)    endocriologist--  dr tyler Janee Morn   (08-18-2021  per pt checks blood sugar daily in am,  fasting sugar--92--130)  . Dysrhythmia   . Environmental and seasonal allergies   . Generalized muscle weakness   . GERD (gastroesophageal reflux disease)   . Hiatal hernia   . History of abdominal abscess 04/16/2017   post surgery 04-01-2017  --- resolved 10/ 2018  . History of COVID-19 05/2021   per pt mild symptoms that resolved  . History of gastric polyp    2014  duodenum  . History of ileus 04/16/2017   resolved w/ no surgical intervention  . History of radiation therapy    01-29-2015 to 03-10-2015  pelvis 50.4Gy  . History of small bowel obstruction 01/2019  . Hypothyroidism    monitored by dr Casimiro Needle altheimer  . Ileostomy in place [facility]) 04/01/2017   created at same time colostomy takedown./  permnant  09/ 2020  . Mixed dyslipidemia   . Multiple thyroid nodules    Managed by Dr. Gerrit Friends  . PAF (paroxysmal atrial fibrillation) [facility])    cardiologist--- dr h. Katrinka Blazing;  event monitor 05/ 2021 in epic, NSR with 1% burden Afib, first degree heart blockl and PACs;  echo 04/ 2021 ef 55-60%, mild LVH  . Peripheral neuropathy   . PONV (postoperative nausea and vomiting)    "scopolamine patch works for me"  . Presence of urostomy (HCC) 04/2019  . Radiation-induced dermatitis    contact dermatitis , radiation completed, rash only on ankles now.  . Vitamin D  deficiency   . Vulval lesion   . Wears glasses    Past Surgical History:  Procedure Laterality Date  . APPENDECTOMY    . BREAST RECONSTRUCTION WITH PLACEMENT OF TISSUE EXPANDER AND FLEX HD (ACELLULAR HYDRATED DERMIS) Bilateral 12/11/2015   Procedure: BILATERAL BREAST RECONSTRUCTION WITH PLACEMENT OF TISSUE EXPANDERS;  Surgeon: Glenna Fellows, MD;  [facility];  Service: Plastics;  Laterality: Bilateral;  . COLONOSCOPY WITH PROPOFOL N/A 08/21/2013   Procedure: COLONOSCOPY WITH PROPOFOL;  Surgeon: Florencia Reasons, MD;  [facility];  Service: Endoscopy;  Laterality: N/A;  . COLOSTOMY TAKEDOWN N/A 12/04/2014   Procedure: LAPROSCOPIC LYSIS OF ADHESIONS, SPLENIC MOBILIZATION, RELOCATION OF COLOSTOMY, DEBRIDEMENT INITIAL COLOSTOMY SITE;  Surgeon: Karie Soda, MD;  [facility];  Service: General;  Laterality: N/A;  . CYSTECTOMY W/ URETEROILEAL CONDUIT  04/12/2019   @[facility] ;   INTESTINAL ANASTOMOSIS, PANNICULECTOMY, PELVIS EXENTENTION,  PARTIAL COLECTOMY REMOVAL ILEIUM WITH PERMNANT ILEOCOLOSTOMY  . CYSTOGRAM N/A 06/01/2017   Procedure: CYSTOGRAM;  Surgeon: Sebastian Ache, MD;  [facility];  Service: Urology;  Laterality: N/A;  . CYSTOSCOPY W/ RETROGRADES Right 11/21/2015   Procedure: CYSTOSCOPY WITH RETROGRADE PYELOGRAM;  Surgeon: Sebastian Ache, MD;  [facility];  Service: Urology;  Laterality: Right;  . CYSTOSCOPY W/ URETERAL STENT PLACEMENT Right 11/21/2015   Procedure: CYSTOSCOPY WITH STENT REPLACEMENT;  Surgeon: Sebastian Ache, MD;  [facility];  Service: Urology;  Laterality: Right;  . CYSTOSCOPY W/ URETERAL STENT PLACEMENT Right 03/10/2016   Procedure: CYSTOSCOPY WITH STENT REPLACEMENT;  Surgeon: Sebastian Ache, MD;  Location: [facility];  Service: Urology;  Laterality: Right;  . CYSTOSCOPY W/ URETERAL STENT PLACEMENT Right 06/30/2016   Procedure: CYSTOSCOPY WITH RETROGRADE PYELOGRAM/URETERAL STENT EXCHANGE;  Surgeon: Sebastian Ache, MD;   Location: [facility];  Service: Urology;  Laterality: Right;  . CYSTOSCOPY W/ URETERAL STENT PLACEMENT N/A 06/01/2017   Procedure: CYSTOSCOPY WITH EXAM UNDER ANESTHESIA;  Surgeon: Sebastian Ache, MD;  [facility];  Service: Urology;  Laterality: N/A;  . CYSTOSCOPY W/ URETERAL STENT PLACEMENT Right 08/17/2017   Procedure: CYSTOSCOPY WITH RETROGRADE PYELOGRAM/URETERAL STENT REMOVAL;  Surgeon: Sebastian Ache, MD;  Location: [facility];  Service: Urology;  Laterality: Right;  . CYSTOSCOPY WITH RETROGRADE PYELOGRAM, URETEROSCOPY AND STENT PLACEMENT Right 03/20/2015   Procedure: CYSTOSCOPY WITH RETROGRADE PYELOGRAM, URETEROSCOPY WITH BALLOON DILATION AND STENT PLACEMENT ON RIGHT;  Surgeon: Sebastian Ache, MD;  Location: [facility];  Service: Urology;  Laterality: Right;  . CYSTOSCOPY WITH RETROGRADE PYELOGRAM, URETEROSCOPY AND STENT PLACEMENT Right 05/02/2015   Procedure: CYSTOSCOPY WITH RIGHT RETROGRADE PYELOGRAM,  DIAGNOSTIC URETEROSCOPY AND STENT PULL ;  Surgeon: Sebastian Ache, MD;  Location: [facility];  Service: Urology;  Laterality: Right;  . CYSTOSCOPY WITH RETROGRADE PYELOGRAM, URETEROSCOPY AND STENT PLACEMENT Right 09/05/2015   Procedure: CYSTOSCOPY WITH RETROGRADE PYELOGRAM,  AND STENT  PLACEMENT;  Surgeon: Sebastian Ache, MD;  [facility];  Service: Urology;  Laterality: Right;  . CYSTOSCOPY WITH RETROGRADE PYELOGRAM, URETEROSCOPY AND STENT PLACEMENT Right 04/01/2017   Procedure: CYSTOSCOPY WITH RETROGRADE PYELOGRAM, URETEROSCOPY AND STENT PLACEMENT;  Surgeon: Sebastian Ache, MD;  [facility];  Service: Urology;  Laterality: Right;  . CYSTOSCOPY WITH STENT PLACEMENT Right 10/27/2016   Procedure: CYSTOSCOPY WITH STENT CHANGE and right retrograde pyelogram;  Surgeon: Sebastian Ache, MD;  Location: [facility];  Service: Urology;  Laterality: Right;  . EUS N/A 10/02/2014   Procedure: LOWER ENDOSCOPIC  ULTRASOUND (EUS);  Surgeon: Willis Modena, MD;  Location: [facility];  Service: Endoscopy;  Laterality: N/A;  . EXCISION SOFT TISSUE MASS RIGHT FOREMAN  12/08/2006  . EYE SURGERY  as child   pytosis of eyelids repair  . INCISION AND DRAINAGE OF WOUND Bilateral 12/26/2015   Procedure: DEBRIDEMENT OF BILATERAL MASTECTOMY FLAPS;  Surgeon: Glenna Fellows, MD;  Location: [facility];  Service: Plastics;  Laterality: Bilateral;  . IR CV LINE INJECTION  05/31/2017  . IR FLUORO GUIDE CV LINE LEFT  05/31/2017  . IR FLUORO GUIDE CV LINE RIGHT  04/06/2017  . IR FLUORO GUIDE CV MIDLINE PICC RIGHT  05/30/2017  . IR NEPHROSTOGRAM LEFT INITIAL PLACEMENT  09/02/2017  . IR NEPHROSTOGRAM LEFT THRU EXISTING ACCESS  11/29/2017  . IR NEPHROSTOGRAM RIGHT INITIAL PLACEMENT  09/02/2017  . IR NEPHROSTOGRAM RIGHT THRU EXISTING ACCESS  09/13/2017  . IR NEPHROSTOGRAM RIGHT THRU EXISTING ACCESS  11/29/2017  . IR NEPHROSTOMY EXCHANGE LEFT  11/28/2017  . IR NEPHROSTOMY EXCHANGE LEFT  01/05/2018  . IR NEPHROSTOMY EXCHANGE LEFT  02/16/2018  . IR NEPHROSTOMY EXCHANGE LEFT  03/30/2018  . IR NEPHROSTOMY EXCHANGE LEFT  05/12/2018  . IR NEPHROSTOMY EXCHANGE LEFT  06/21/2018  . IR NEPHROSTOMY EXCHANGE LEFT  08/04/2018  . IR NEPHROSTOMY EXCHANGE LEFT  09/18/2018  . IR NEPHROSTOMY EXCHANGE LEFT  10/09/2018  . IR NEPHROSTOMY EXCHANGE LEFT  10/27/2018  . IR NEPHROSTOMY EXCHANGE LEFT  11/21/2018  . IR NEPHROSTOMY EXCHANGE LEFT  01/05/2019  . IR NEPHROSTOMY EXCHANGE LEFT  02/15/2019  . IR NEPHROSTOMY EXCHANGE LEFT  03/29/2019  . IR NEPHROSTOMY EXCHANGE RIGHT  10/02/2017  . IR NEPHROSTOMY EXCHANGE RIGHT  11/28/2017  . IR NEPHROSTOMY EXCHANGE RIGHT  01/05/2018  . IR NEPHROSTOMY EXCHANGE RIGHT  02/16/2018  . IR NEPHROSTOMY EXCHANGE RIGHT  03/30/2018  . IR NEPHROSTOMY EXCHANGE RIGHT  05/12/2018  . IR NEPHROSTOMY EXCHANGE RIGHT  06/21/2018  . IR NEPHROSTOMY EXCHANGE RIGHT  08/04/2018  . IR NEPHROSTOMY EXCHANGE  RIGHT  09/18/2018  . IR NEPHROSTOMY EXCHANGE RIGHT  10/27/2018  . IR NEPHROSTOMY EXCHANGE RIGHT  11/21/2018  . IR NEPHROSTOMY EXCHANGE RIGHT  01/05/2019  . IR NEPHROSTOMY EXCHANGE RIGHT  02/15/2019  . IR NEPHROSTOMY EXCHANGE RIGHT  03/29/2019  . IR NEPHROSTOMY PLACEMENT LEFT  10/02/2017  . IR RADIOLOGIST EVAL & MGMT  05/03/2017  . IR US GUIDE VASC ACCESS LEFT  05/31/2017  . IR US GUIDE VASC ACCESS RIGHT  04/06/2017  . IR US GUIDE VASC ACCESS RIGHT  05/30/2017  . LAPAROSCOPIC CHOLECYSTECTOMY  1990  . LIPOSUCTION WITH LIPOFILLING Bilateral 04/16/2016   Procedure: LIPOSUCTION WITH LIPOFILLING TO BILATERAL CHEST;  Surgeon: Glenna Fellows, MD;  Location: [facility];  Service: Plastics;  Laterality: Bilateral;  . MASTECTOMY W/ SENTINEL NODE BIOPSY Bilateral 12/11/2015   Procedure: RIGHT PROPHYLACTIC MASTECTOMY, LEFT TOTAL MASTECTOMY WITH LEFT AXILLARY SENTINEL LYMPH NODE BIOPSY;  Surgeon:  Almond Lint, MD;  [facility];  Service: General;  Laterality: Bilateral;  . OSTOMY N/A 11/19/2014   Procedure: OSTOMY;  Surgeon: Karie Soda, MD;  [facility];  Service: General;  Laterality: N/A;  . PROCTOSCOPY N/A 04/01/2017   Procedure: RIDGE PROCTOSCOPY;  Surgeon: Karie Soda, MD;  [facility];  Service: General;  Laterality: N/A;  . REMOVAL OF BILATERAL TISSUE EXPANDERS WITH PLACEMENT OF BILATERAL BREAST IMPLANTS Bilateral 04/16/2016   Procedure: REMOVAL OF BILATERAL TISSUE EXPANDERS WITH PLACEMENT OF BILATERAL BREAST IMPLANTS;  Surgeon: Glenna Fellows, MD;  Location: [facility];  Service: Plastics;  Laterality: Bilateral;  . ROBOTIC ASSISTED LAP VAGINAL HYSTERECTOMY N/A 11/19/2014   Procedure: ROBOTIC LYSIS OF ADHESIONS, CONVERTED TO LAPAROTOMY RADICAL UPPER VAGINECTOMY,LOW ANTERIOR BOWEL RESECTION, COLOSTOMY, BILATERAL URETERAL STENT PLACEMENT AND CYSTONOMY CLOSURE;  Surgeon: Adolphus Birchwood, MD;  [facility];  Service: Gynecology;  Laterality: N/A;  .  TISSUE EXPANDER FILLING Bilateral 12/26/2015   Procedure: EXPANSION OF BILATERAL CHEST TISSUE EXPANDERS (60 mL- Right; 75 mL- Left);  Surgeon: Glenna Fellows, MD;  Location: [facility];  Service: Plastics;  Laterality: Bilateral;  . TONSILLECTOMY    . TOTAL ABDOMINAL HYSTERECTOMY  March 2006   [facility]   and Bilateral Salpingoophorectomy/  staging for Ovarian cancer/  an  . VULVAR LESION REMOVAL N/A 08/19/2021   Procedure: VULVAR LESION with biopsies;  Surgeon: Carver Fila, MD;  Location: [facility];  Service: Gynecology;  Laterality: N/A;  . XI ROBOTIC ASSISTED LOWER ANTERIOR RESECTION N/A 04/01/2017   Procedure: XI ROBOTIC VS LAPAROSCOPIC COLOSTOMY TAKEDOWN WITH LYSIS OF ADHESIONS.;  Surgeon: Karie Soda, MD;  [facility];  Service: General;  Laterality: N/A;  ERAS PATHWAY   Social History   Tobacco Use  . Smoking status: Never    Passive exposure: Never  . Smokeless tobacco: Never  Vaping Use  . Vaping status: Never Used  Substance Use Topics  . Alcohol use: Yes    Comment: Occ  . Drug use: No   Social History   Socioeconomic History  . Marital status: Married    Spouse name: Milayna Amerson  . Number of children: 1  . Years of education: Not on file  . Highest education level: Master's degree (e.g., MA, MS, MEng, MEd, MSW, MBA)  Occupational History  . Occupation: retired Charity fundraiser from [employer]    Comment: L&D Charity fundraiser - retired  Tobacco Use  . Smoking status: Never    Passive exposure: Never  . Smokeless tobacco: Never  Vaping Use  . Vaping status: Never Used  Substance and Sexual Activity  . Alcohol use: Yes    Comment: Occ  . Drug use: No  . Sexual activity: Not Currently  Other Topics Concern  . Not on file  Social History Narrative   Exercise-- has not gotten back into it since cancer came back   Social Drivers of Health   Financial Resource Strain: Low Risk  (08/17/2023)   Overall Financial Resource Strain (CARDIA)   . Difficulty  of Paying Living Expenses: Not very hard  Food Insecurity: No Food Insecurity (08/17/2023)   Hunger Vital Sign   . Worried About Programme researcher, broadcasting/film/video in the Last Year: Never true   . Ran Out of Food in the Last Year: Never true  Transportation Needs: No Transportation Needs (08/17/2023)   PRAPARE - Transportation   . Lack of Transportation (Medical): No   . Lack of Transportation (Non-Medical): No  Physical Activity:  Insufficiently Active (08/17/2023)   Exercise Vital Sign   . Days of Exercise per Week: 2 days   . Minutes of Exercise per Session: 20 min  Stress: No Stress Concern Present (08/17/2023)   [employer] of Occupational Health - Occupational Stress Questionnaire   . Feeling of Stress : Not at all  Social Connections: Socially Integrated (08/17/2023)   Social Connection and Isolation Panel [NHANES]   . Frequency of Communication with Friends and Family: More than three times a week   . Frequency of Social Gatherings with Friends and Family: Three times a week   . Attends Religious Services: More than 4 times per year   . Active Member of Clubs or Organizations: Yes   . Attends Banker Meetings: More than 4 times per year   . Marital Status: Married  Catering manager Violence: Not on file   Family Status  Relation Name Status  . Mother  Deceased  . Father  Deceased  . Mat Aunt  Deceased  . Emelda Brothers  Deceased  . MGM  Deceased  . MGF  Deceased  . PGM  Deceased  . PGF  Deceased  . Mat Aunt  Deceased  . Other  Deceased  . Sister  (Not Specified)  . Brother  (Not Specified)  No partnership data on file   Family History  Problem Relation Age of Onset  . Cancer Mother 61       stomach ca  . Hypertension Mother   . Cancer Father 52       prostate ca  . Diabetes Father   . Heart disease Father        CABG  . Hypertension Father   . Hyperlipidemia Father   . Obesity Father   . Breast cancer Maternal Aunt        dx in her 49s  . Lymphoma Paternal  Aunt   . Brain cancer Paternal Grandfather   . Ovarian cancer Other   . Diabetes Sister   . Hypertension Brother y-10  . Heart disease Brother        CABG  . Diabetes Brother    Allergies  Allergen Reactions  . Penicillins Hives and Swelling    Facial swelling/childhood allergy Has patient had a PCN reaction causing immediate rash, facial/tongue/throat swelling, SOB or lightheadedness with hypotension: Yes Has patient had a PCN reaction causing severe rash involving mucus membranes or skin necrosis: Yes Has patient had a PCN reaction that required hospitalization yes Has patient had a PCN reaction occurring within the last 10 years: No If all of the above answers are "NO", then may proceed with Cephalosporin use.   . Erythromycin Other (See Comments)    Gastritis, abd cramps  . Tape Rash    blisters  . Trimethoprim Rash  . Ultram [Tramadol] Hives  . Zarxio [Filgrastim] Other (See Comments)    Post injection, elevated heart rate, body feeling unwell, uneasy.   . Cephalosporins Rash  . Fluconazole Rash  . Neomycin Rash    blisters  . Oxycodone Other (See Comments)    " I just feel weird"  . Pectin Rash    Pectin ring for stoma  . Septra [Sulfamethoxazole-Trimethoprim] Rash  . Sulfa Antibiotics Rash      Review of Systems  Constitutional:  Positive for malaise/fatigue. Negative for fever.  HENT:  Negative for congestion.   Eyes:  Negative for blurred vision.  Respiratory:  Negative for shortness of breath.   Cardiovascular:  Negative for chest pain, palpitations and leg swelling.  Gastrointestinal:  Positive for abdominal pain. Negative for blood in stool and nausea.       Pt just had nephrostomy tubes placed--- they have been causing pain She will call urology when she gets home   Genitourinary:  Negative for dysuria and frequency.  Musculoskeletal:  Negative for falls.  Skin:  Negative for rash.  Neurological:  Negative for dizziness, loss of consciousness and  headaches.  Endo/Heme/Allergies:  Negative for environmental allergies.  Psychiatric/Behavioral:  Negative for depression. The patient is not nervous/anxious.      Objective:     BP 120/62 (BP Location: Left Arm, Patient Position: Sitting, Cuff Size: Large)   Pulse 75   Temp 98.6 F (37 C) (Oral)   Resp 18   Ht 5\' 1"  (1.549 m)   Wt 204 lb 9.6 oz (92.8 kg)   SpO2 97%   BMI 38.66 kg/m  BP Readings from Last 3 Encounters:  08/22/23 120/62  08/08/23 132/80  07/12/23 129/72   Wt Readings from Last 3 Encounters:  08/22/23 204 lb 9.6 oz (92.8 kg)  08/08/23 205 lb 9.6 oz (93.3 kg)  07/12/23 208 lb (94.3 kg)   SpO2 Readings from Last 3 Encounters:  08/22/23 97%  08/08/23 98%  07/12/23 (!) 69%      Physical Exam   No results found for any visits on 08/22/23.  Last CBC Lab Results  Component Value Date   WBC 6.6 02/17/2023   HGB 11.7 (L) 02/17/2023   HCT 36.8 02/17/2023   MCV 91.4 02/17/2023   MCH 30.3 10/04/2022   RDW 14.4 02/17/2023   PLT 242.0 02/17/2023   Last metabolic panel Lab Results  Component Value Date   GLUCOSE 123 (H) 02/17/2023   NA 138 02/17/2023   K 4.1 02/17/2023   CL 105 02/17/2023   CO2 25 02/17/2023   BUN 30 (H) 02/17/2023   CREATININE 1.84 (H) 02/17/2023   GFR 27.85 (L) 02/17/2023   CALCIUM 9.2 02/17/2023   PHOS 3.4 01/22/2019   PROT 7.3 02/17/2023   ALBUMIN 3.7 02/17/2023   LABGLOB 3.4 06/16/2020   AGRATIO 1.3 06/16/2020   BILITOT 0.4 02/17/2023   ALKPHOS 200 (H) 02/17/2023   AST 16 02/17/2023   ALT 33 02/17/2023   ANIONGAP 6 06/04/2022   Last lipids Lab Results  Component Value Date   CHOL 77 02/17/2023   HDL 39.20 02/17/2023   LDLCALC 14 02/17/2023   TRIG 118.0 02/17/2023   CHOLHDL 2 02/17/2023   Last hemoglobin A1c Lab Results  Component Value Date   HGBA1C 6.1 02/15/2022   Last thyroid functions Lab Results  Component Value Date   TSH 2.27 02/17/2023   Last vitamin D Lab Results  Component Value Date    VD25OH 36.40 02/17/2023   Last vitamin B12 and Folate Lab Results  Component Value Date   VITAMINB12 950 (H) 02/15/2022   FOLATE >20.0 04/05/2018      The ASCVD Risk score (Arnett DK, et al., 2019) failed to calculate for the following reasons:   The valid total cholesterol range is 130 to 320 mg/dL    Assessment & Plan:   Problem List Items Addressed This Visit       Unprioritized   Vitamin D deficiency   Relevant Orders   VITAMIN D 25 Hydroxy (Vit-D Deficiency, Fractures)   UTI (urinary tract infection)   Relevant Medications   doxycycline (VIBRA-TABS) 100 MG tablet   Paroxysmal atrial fibrillation (  HCC)   Type 2 diabetes mellitus with other specified complication (HCC) - Primary   Relevant Orders   Comprehensive metabolic panel   Hemoglobin A1c   Insulin, random   Ambulatory referral to Podiatry   Hypothyroidism   Relevant Orders   TSH   HLD (hyperlipidemia)   Benign essential HTN   Relevant Orders   Comprehensive metabolic panel   CBC with Differential/Platelet   Obesity, Beginning BMI 38.04   Relevant Orders   Hemoglobin A1c   VITAMIN D 25 Hydroxy (Vit-D Deficiency, Fractures)   Insulin, random   Other Visit Diagnoses       Pneumonia of left lower lobe due to infectious organism       Relevant Medications   doxycycline (VIBRA-TABS) 100 MG tablet   Other Relevant Orders   DG Chest 2 View (Completed)     Assessment and Plan    Pneumonia Diagnosed with pneumonia by Dr. Carmelia Roller, initially treated with a Z-Pak and cough syrup. Symptoms include a persistent cough with deep breaths, fatigue, and a clogged right ear, but no recent fever. Differential diagnosis includes RSV, influenza, or other respiratory infections. Plan to order a chest x-ray and prescribe doxycycline for 10 days. Use Flonase once daily and antihistamines as needed. Avoid decongestants due to previous adverse reactions.  Fluid in Right Ear Persistent clogged sensation in the right ear  with crackling sounds, with fluid previously noted by Dr. Carmelia Roller. Use Flonase once daily and antihistamines as needed, considering the risk of over-drying nasal passages.  Nephrostomy Tube Management Nephrostomy tubes were placed due to a complete blockage in the right kidney and partial blockage in the left kidney. There is rebound tenderness around the urostomy site, difficulty with bag placement, and leakage. Cloudy urine raises concerns about potential infection. Call nephrologist Dr. Logan Bores for evaluation, check kidney function today, and prescribe doxycycline for 10 days.  General Health Maintenance Received flu and RSV vaccines in October 2024. A callus and bunion on the right foot cause discomfort. Refer to a podiatrist for foot evaluation and management.  Follow-up Follow up with nephrologist Dr. Logan Bores in February for tube exchange and with primary care as needed.        No follow-ups on file.    Donato Schultz, DO

## 2023-08-23 LAB — INSULIN, RANDOM: Insulin: 44.6 u[IU]/mL — ABNORMAL HIGH

## 2023-08-25 DIAGNOSIS — Z933 Colostomy status: Secondary | ICD-10-CM | POA: Diagnosis not present

## 2023-08-25 DIAGNOSIS — N1832 Chronic kidney disease, stage 3b: Secondary | ICD-10-CM | POA: Diagnosis not present

## 2023-08-25 DIAGNOSIS — I10 Essential (primary) hypertension: Secondary | ICD-10-CM | POA: Diagnosis not present

## 2023-08-25 DIAGNOSIS — E119 Type 2 diabetes mellitus without complications: Secondary | ICD-10-CM | POA: Diagnosis not present

## 2023-08-25 DIAGNOSIS — N139 Obstructive and reflux uropathy, unspecified: Secondary | ICD-10-CM | POA: Diagnosis not present

## 2023-08-25 DIAGNOSIS — Z936 Other artificial openings of urinary tract status: Secondary | ICD-10-CM | POA: Diagnosis not present

## 2023-08-27 ENCOUNTER — Encounter: Payer: Self-pay | Admitting: Family Medicine

## 2023-08-28 ENCOUNTER — Other Ambulatory Visit (INDEPENDENT_AMBULATORY_CARE_PROVIDER_SITE_OTHER): Payer: Self-pay | Admitting: Family Medicine

## 2023-08-28 DIAGNOSIS — F3289 Other specified depressive episodes: Secondary | ICD-10-CM

## 2023-08-28 DIAGNOSIS — E1169 Type 2 diabetes mellitus with other specified complication: Secondary | ICD-10-CM

## 2023-08-31 ENCOUNTER — Ambulatory Visit (INDEPENDENT_AMBULATORY_CARE_PROVIDER_SITE_OTHER): Payer: BC Managed Care – PPO | Admitting: Podiatry

## 2023-08-31 ENCOUNTER — Encounter: Payer: Self-pay | Admitting: Podiatry

## 2023-08-31 ENCOUNTER — Ambulatory Visit (INDEPENDENT_AMBULATORY_CARE_PROVIDER_SITE_OTHER): Payer: BC Managed Care – PPO

## 2023-08-31 ENCOUNTER — Telehealth: Payer: Self-pay | Admitting: Urology

## 2023-08-31 DIAGNOSIS — M2042 Other hammer toe(s) (acquired), left foot: Secondary | ICD-10-CM

## 2023-08-31 DIAGNOSIS — M778 Other enthesopathies, not elsewhere classified: Secondary | ICD-10-CM | POA: Diagnosis not present

## 2023-08-31 DIAGNOSIS — M7752 Other enthesopathy of left foot: Secondary | ICD-10-CM | POA: Diagnosis not present

## 2023-08-31 DIAGNOSIS — Z01818 Encounter for other preprocedural examination: Secondary | ICD-10-CM

## 2023-08-31 DIAGNOSIS — M24575 Contracture, left foot: Secondary | ICD-10-CM

## 2023-08-31 NOTE — Telephone Encounter (Signed)
Spoke with pt and she stated that she would call back tomorrow to schedule her sx with Dr. Allena Katz.

## 2023-08-31 NOTE — Progress Notes (Signed)
Subjective:  Patient ID: [name], female    DOB: 1955/11/15,  MRN: [id number]  Chief Complaint  Patient presents with   Foot Pain    RM#11 Left foot pain for a long time now big toe and second toe are causing pain.    68 y.o. female presents with the above complaint. Patient presents with left first metatarsophalangeal joint pain.  As well as second toe pain.  She states has been on for quite some time is progressive gotten worse worse with ambulation worse with pressure is causing a lot of pain she has tried shoe gear modification padding protecting offloading none of which has helped she would like to discuss surgical options at this time.  Pain scale 7 out of 10  Review of Systems: Negative except as noted in the HPI. Denies N/V/F/Ch.  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(TAH w/ BSO) :  recurrent 11-19-2014 post pelvic surgery and radiation 01-29-2015 to 03-10-2015   Chronic idiopathic neutropenia (HCC)    presumed related to chemotherapy March 2006--- followed by dr Bertis Ruddy (treatment w/ G-CSF injections   Chronic nausea    CKD (chronic kidney disease), stage III [facility])    nephrologist-  dr Bufford Buttner   Difficult intravenous access    small veins--- hx PICC lines   DM type 2 (diabetes mellitus, type 2) (HCC)    endocriologist--  dr Joselyn Glassman Janee Morn   (08-18-2021  per pt checks blood sugar daily in am,  fasting sugar--92--130)   Dysrhythmia    Environmental  and seasonal allergies    Generalized muscle weakness    GERD (gastroesophageal reflux disease)    Hiatal hernia    History of abdominal abscess 04/16/2017   post surgery 04-01-2017  --- resolved 10/ 2018   History of COVID-19 05/2021   per pt mild symptoms that resolved   History of gastric polyp    2014  duodenum   History of ileus 04/16/2017   resolved w/ no surgical intervention   History of radiation therapy    01-29-2015 to 03-10-2015  pelvis 50.4Gy   History of small bowel obstruction 01/2019   Hypothyroidism    monitored by dr Casimiro Needle altheimer   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 Gerrit Friends   PAF (paroxysmal atrial fibrillation) [facility])    cardiologist--- dr h. 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   Vitamin D deficiency    Vulval lesion    Wears glasses  Current Outpatient Medications:    aspirin EC 81 MG tablet, Take 1 tablet (81 mg total) by mouth daily. Swallow whole., Disp: 90 tablet, Rfl: 3   Biotin 5 MG TABS, Take 5 mg by mouth daily. , Disp: , Rfl:    buPROPion (WELLBUTRIN) 100 MG tablet, Take 1 tablet (100 mg total) by mouth 2 (two) times daily., Disp: 180 tablet, Rfl: 0   calcium citrate-vitamin D (CITRACAL+D) 315-200 MG-UNIT tablet, Take 1 tablet by mouth daily., Disp: , Rfl:    Cholecalciferol (VITAMIN D3) 10000 UNITS capsule, Take 10,000 Units by mouth See admin instructions. Take 1 tablet by mouth 43times per week., Disp: , Rfl:    clobetasol (OLUX) 0.05 % topical foam, Apply topically 2 (two)  times daily., Disp: 50 g, Rfl: 3   Continuous Blood Gluc Sensor (FREESTYLE LIBRE 3 SENSOR) MISC, Inject 1 sensor to the skin every 14 days for continuous glucose monitoring., Disp: , Rfl:    Cranberry-Vitamin C 15000-100 MG CAPS, Take by mouth daily. 1 in Morning and 1 in PM, Disp: , Rfl:    doxycycline (VIBRA-TABS) 100 MG tablet, Take 1 tablet (100 mg total) by mouth 2 (two) times daily., Disp: 20 tablet, Rfl: 0   famotidine-calcium carbonate-magnesium hydroxide (PEPCID COMPLETE) 10-800-165 MG chewable tablet, Chew 1 tablet by mouth as needed., Disp: , Rfl:    ferrous sulfate 325 (65 FE) MG tablet, Take 1 tablet (325 mg total) by mouth at bedtime., Disp: 30 tablet, Rfl: 3   filgrastim-aafi (NIVESTYM) 480 MCG/0.8ML SOSY injection, Inject 0.8 mLs (480 mcg total) into the skin every 7 (seven) days., Disp: 3.2 mL, Rfl: 11   fluticasone (FLONASE) 50 MCG/ACT nasal spray, Place 2 sprays into both nostrils daily., Disp: 16 g, Rfl: 0   HYDROcodone bit-homatropine (HYCODAN) 5-1.5 MG/5ML syrup, Take 5 mLs by mouth every 6 (six) hours as needed for cough., Disp: 120 mL, Rfl: 0   levothyroxine (SYNTHROID, LEVOTHROID) 150 MCG tablet, Take 1 tablet (150 mcg total) by mouth daily before breakfast., Disp: 30 tablet, Rfl: 1   loratadine (CLARITIN) 10 MG tablet, Take 10 mg by mouth daily. , Disp: , Rfl:    MOUNJARO 5 MG/0.5ML Pen, Inject 5 mg into the skin once a week., Disp: 6 mL, Rfl: 0   moxifloxacin (VIGAMOX) 0.5 % ophthalmic solution, Place 1 drop into both eyes 3 (three) times daily., Disp: 3 mL, Rfl: 0   omega-3 acid ethyl esters (LOVAZA) 1 G capsule, Take 1 g by mouth 2 (two) times daily. , Disp: , Rfl:    Polyethyl Glycol-Propyl Glycol (SYSTANE OP), Place 1 drop into both eyes daily as needed (dry eyes). , Disp: , Rfl:    pregabalin (LYRICA) 50 MG capsule, Take 1 capsule (50 mg total) by mouth 2 (two) times daily., Disp: 60 capsule, Rfl: 5   Prenatal Vit-Fe Fumarate-FA (PRENATAL VITAMIN PO), Take 1 capsule by  mouth daily. Takes prenatal because there are no dyes in it, Disp: , Rfl:    Probiotic Product (ALIGN PO), Take 1 capsule by mouth daily., Disp: , Rfl:    rosuvastatin (CRESTOR) 10 MG tablet, Take 10 mg by mouth every evening., Disp: , Rfl:    Saccharomyces boulardii (FLORASTOR PO), Take 1 capsule by mouth at bedtime., Disp: , Rfl:    Tazarotene (ARAZLO) 0.045 % LOTN, Apply 1 application topically daily., Disp: 45 g, Rfl: 0   tiZANidine (ZANAFLEX) 4 MG tablet, Take 0.5-1 tablets (2-4 mg total) by mouth every 8 (eight) hours as needed  for muscle spasms., Disp: 180 tablet, Rfl: 3   XIFAXAN 550 MG TABS tablet, Take 550 mg by mouth as needed., Disp: , Rfl:   Social History   Tobacco Use  Smoking Status Never   Passive exposure: Never  Smokeless Tobacco Never    Allergies  Allergen Reactions   Penicillins Hives and Swelling    Facial swelling/childhood allergy Has patient had a PCN reaction causing immediate rash, facial/tongue/throat swelling, SOB or lightheadedness with hypotension: Yes Has patient had a PCN reaction causing severe rash involving mucus membranes or skin necrosis: Yes Has patient had a PCN reaction that required hospitalization yes Has patient had a PCN reaction occurring within the last 10 years: No If all of the above answers are "NO", then may proceed with Cephalosporin use.    Erythromycin Other (See Comments)    Gastritis, abd cramps   Tape Rash    blisters   Trimethoprim Rash   Ultram [Tramadol] Hives   Zarxio [Filgrastim] Other (See Comments)    Post injection, elevated heart rate, body feeling unwell, uneasy.    Cephalosporins Rash   Fluconazole Rash   Neomycin Rash    blisters   Oxycodone Other (See Comments)    " I just feel weird"   Pectin Rash    Pectin ring for stoma   Septra [Sulfamethoxazole-Trimethoprim] Rash   Sulfa Antibiotics Rash   Objective:  There were no vitals filed for this visit. There is no height or weight on file to calculate  BMI. Constitutional Well developed. Well nourished.  Vascular Dorsalis pedis pulses palpable bilaterally. Posterior tibial pulses palpable bilaterally. Capillary refill normal to all digits.  No cyanosis or clubbing noted. Pedal hair growth normal.  Neurologic Normal speech. Oriented to person, place, and time. Epicritic sensation to light touch grossly present bilaterally.  Dermatologic Nails well groomed and normal in appearance. No open wounds. No skin lesions.  Orthopedic: Left first metatarsophalangeal joint pain pain with range of motion of the joint deep intra-articular pain noted limited range of motion noted of the first MPJ.  Hammertoe contracture of second digit noted semiflexible in nature mild pain on palpation   Radiographs: 3 views of skeletally mature adult left foot: Severe bunion deformity noted with arthritis of the first metatarsophalangeal joint noted hammertoe contracture of second digit noted.  Midfoot arthritis noted plantar posterior heel spurring noted pes planovalgus foot structure noted Assessment:   1. Capsulitis of metatarsophalangeal (MTP) joint of left foot   2. Hammertoe of second toe of left foot   3. Joint contracture of foot, left    Plan:  Patient was evaluated and treated and all questions answered.  Left second digit hammertoe contracture with left first metatarsophalangeal joint arthritis -All questions and concerns were discussed with patient extensive due to given the amount of pain that she is having she will benefit from surgical intervention.  She has failed all conservative care listed above.  I discussed with her my preoperative intra postop plan for surgery in extensive detail she would benefit from left first MPJ fusion with left second digit hammertoe correction with possible capsulotomy.  She states understanding would like to proceed with surgery -Informed surgical risk consent was reviewed and read aloud to the patient.  I reviewed the  films.  I have discussed my findings with the patient in great detail.  I have discussed all risks including but not limited to infection, stiffness, scarring, limp, disability, deformity, damage to blood vessels and nerves, numbness, poor healing,  need for braces, arthritis, chronic pain, amputation, death.  All benefits and realistic expectations discussed in great detail.  I have made no promises as to the outcome.  I have provided realistic expectations.  I have offered the patient a 2nd opinion, which they have declined and assured me they preferred to proceed despite the risks   No follow-ups on file.

## 2023-09-08 ENCOUNTER — Encounter (INDEPENDENT_AMBULATORY_CARE_PROVIDER_SITE_OTHER): Payer: Self-pay | Admitting: Family Medicine

## 2023-09-08 ENCOUNTER — Ambulatory Visit (INDEPENDENT_AMBULATORY_CARE_PROVIDER_SITE_OTHER): Payer: BC Managed Care – PPO | Admitting: Family Medicine

## 2023-09-08 VITALS — BP 150/75 | HR 62 | Temp 98.2°F | Ht 61.0 in | Wt 202.0 lb

## 2023-09-08 DIAGNOSIS — E669 Obesity, unspecified: Secondary | ICD-10-CM

## 2023-09-08 DIAGNOSIS — Z7985 Long-term (current) use of injectable non-insulin antidiabetic drugs: Secondary | ICD-10-CM

## 2023-09-08 DIAGNOSIS — E119 Type 2 diabetes mellitus without complications: Secondary | ICD-10-CM | POA: Diagnosis not present

## 2023-09-08 DIAGNOSIS — F5089 Other specified eating disorder: Secondary | ICD-10-CM | POA: Diagnosis not present

## 2023-09-08 DIAGNOSIS — F3289 Other specified depressive episodes: Secondary | ICD-10-CM

## 2023-09-08 DIAGNOSIS — Z6838 Body mass index (BMI) 38.0-38.9, adult: Secondary | ICD-10-CM

## 2023-09-08 DIAGNOSIS — E1169 Type 2 diabetes mellitus with other specified complication: Secondary | ICD-10-CM

## 2023-09-08 DIAGNOSIS — I10 Essential (primary) hypertension: Secondary | ICD-10-CM

## 2023-09-08 MED ORDER — BUPROPION HCL 100 MG PO TABS: 100.0000 mg | ORAL_TABLET | Freq: Two times a day (BID) | ORAL | 0 refills | Status: DC

## 2023-09-08 MED ORDER — MOUNJARO 5 MG/0.5ML ~~LOC~~ SOAJ: 5.0000 mg | SUBCUTANEOUS | 0 refills | Status: DC

## 2023-09-08 NOTE — Progress Notes (Signed)
 .smr  Office: [phone]  /  Fax: [phone]  WEIGHT SUMMARY AND BIOMETRICS  Anthropometric Measurements Height: 5' 1 (1.549 m) Weight: 202 lb (91.6 kg) BMI (Calculated): 38.19 Weight at Last Visit: 208 lb Weight Lost Since Last Visit: 6 lb Weight Gained Since Last Visit: 0 Starting Weight: 208 lb Total Weight Loss (lbs): 6 lb (2.722 kg) Peak Weight: 300 lb   Body Composition  Body Fat %: 49.7 % Fat Mass (lbs): 100.6 lbs Muscle Mass (lbs): 96.6 lbs Total Body Water  (lbs): 73.8 lbs Visceral Fat Rating : 16   Other Clinical Data Fasting: No Labs: No Today's Visit #: 75 Starting Date: 04/05/18    Chief Complaint: OBESITY   History of Present Illness   [name] is a 68 year old female with obesity, emotional eating behaviors, and hypertension who presents for management of these conditions.  She is managing obesity through diet, exercise, and weight loss, having lost six pounds since her last visit. She is not on any medications specifically for obesity management.  She is taking bupropion  for emotional eating behaviors and has been doing well with this medication, requesting a refill of her Wellbutrin .  Her blood pressure was initially elevated at 150/75, but a repeat measurement was within normal limits at 135/78. She is not currently on medications for hypertension and has been focusing on diet, exercise, and weight loss to manage her condition.  She is managing her type 2 diabetes with dietary changes, focusing on reducing simple carbohydrates, and weight loss. Her most recent hemoglobin A1c was well controlled at 6.4, and she is on Mounjaro  for diabetes management.  She experienced a recent illness after volunteering at a homeless shelter, suspecting pneumonia due to symptoms like rattling in her chest. She was initially treated with antibiotics, which did not fully resolve her symptoms, leading to a second round of antibiotics that helped her recover.  Her husband, Signe, also contracted pneumonia two weeks later.  She is scheduled for foot surgery on March 17th due to a painful hammer toe and overlapping big toe, which has been causing discomfort while walking.  She has internal nephrostomy tubes and is scheduled for an exchange next week. She expresses concern about the procedure and potential discomfort.          PHYSICAL EXAM:  Blood pressure (!) 150/75, pulse 62, temperature 98.2 F (36.8 C), height 5' 1 (1.549 m), weight 202 lb (91.6 kg), SpO2 100%. Body mass index is 38.17 kg/m.  DIAGNOSTIC DATA REVIEWED:  BMET    Component Value Date/Time   NA 138 08/22/2023 1015   NA 141 01/11/2022 1544   NA 141 01/24/2017 1228   K 4.3 08/22/2023 1015   K 4.6 01/24/2017 1228   CL 102 08/22/2023 1015   CO2 24 08/22/2023 1015   CO2 29 01/24/2017 1228   GLUCOSE 132 (H) 08/22/2023 1015   GLUCOSE 84 01/24/2017 1228   BUN 31 (H) 08/22/2023 1015   BUN 29 (H) 01/11/2022 1544   BUN 22.2 01/24/2017 1228   CREATININE 1.73 (H) 08/22/2023 1015   CREATININE 2.20 (H) 06/04/2022 1450   CREATININE 1.91 (H) 01/18/2020 1440   CREATININE 0.8 01/24/2017 1228   CALCIUM  9.5 08/22/2023 1015   CALCIUM  10.2 01/24/2017 1228   GFRNONAA 24 (L) 06/04/2022 1450   GFRNONAA 78 12/16/2014 1530   GFRAA 38 (L) 06/16/2020 0834   GFRAA 30 (L) 03/27/2018 1324   GFRAA >89 12/16/2014 1530   Lab Results  Component Value Date  HGBA1C 6.4 08/22/2023   HGBA1C 6.3 (H) 11/07/2014   Lab Results  Component Value Date   INSULIN  14.1 06/16/2020   Lab Results  Component Value Date   TSH 1.23 08/22/2023   CBC    Component Value Date/Time   WBC 13.9 (H) 08/22/2023 1015   RBC 4.05 08/22/2023 1015   HGB 12.4 08/22/2023 1015   HGB 11.3 (L) 06/04/2022 1450   HGB 11.5 01/11/2022 1544   HGB 12.4 07/29/2017 1444   HCT 38.3 08/22/2023 1015   HCT 35.5 01/11/2022 1544   HCT 38.0 07/29/2017 1444   PLT 256.0 08/22/2023 1015   PLT 184 06/04/2022 1450   PLT 207  01/11/2022 1544   MCV 94.6 08/22/2023 1015   MCV 94 01/11/2022 1544   MCV 90.9 07/29/2017 1444   MCH 30.3 10/04/2022 1152   MCHC 32.5 08/22/2023 1015   RDW 14.7 08/22/2023 1015   RDW 13.1 01/11/2022 1544   RDW 15.5 (H) 07/29/2017 1444   Iron  Studies    Component Value Date/Time   IRON  7 (L) 08/31/2017 0451   IRON  12 (L) 11/29/2014 1251   TIBC 164 (L) 08/31/2017 0451   TIBC 331 11/29/2014 1251   FERRITIN 27 11/29/2014 1251   IRONPCTSAT 4 (L) 08/31/2017 0451   IRONPCTSAT 4 (L) 11/29/2014 1251   Lipid Panel     Component Value Date/Time   CHOL 77 02/17/2023 0948   CHOL 92 (L) 06/16/2020 0834   TRIG 118.0 02/17/2023 0948   HDL 39.20 02/17/2023 0948   HDL 49 06/16/2020 0834   CHOLHDL 2 02/17/2023 0948   VLDL 23.6 02/17/2023 0948   LDLCALC 14 02/17/2023 0948   LDLCALC 21 06/16/2020 0834   LDLCALC 13 01/18/2020 1440   Hepatic Function Panel     Component Value Date/Time   PROT 7.5 08/22/2023 1015   PROT 7.8 06/16/2020 0834   PROT 6.9 01/24/2017 1228   ALBUMIN  3.9 08/22/2023 1015   ALBUMIN  4.4 06/16/2020 0834   ALBUMIN  3.4 (L) 01/24/2017 1228   AST 18 08/22/2023 1015   AST 12 (L) 06/04/2022 1450   AST 16 01/24/2017 1228   ALT 24 08/22/2023 1015   ALT 14 06/04/2022 1450   ALT 14 01/24/2017 1228   ALKPHOS 159 (H) 08/22/2023 1015   ALKPHOS 90 01/24/2017 1228   BILITOT 0.4 08/22/2023 1015   BILITOT 0.6 06/04/2022 1450   BILITOT 0.34 01/24/2017 1228      Component Value Date/Time   TSH 1.23 08/22/2023 1015   Nutritional Lab Results  Component Value Date   VD25OH 44.37 08/22/2023   VD25OH 36.40 02/17/2023   VD25OH 31.09 02/15/2022     Assessment and Plan    Obesity Patient is working on diet, exercise, and weight loss, having lost six pounds since her last visit. She prefers journaling over using the Lose It app for dietary tracking. - Continue current diet and exercise regimen - Encourage journaling for dietary tracking  Type 2 Diabetes  [place] Patient's diabetes is well-managed with dietary changes and weight loss. Her most recent hemoglobin A1c was 6.4. She is on Mounjaro . - Refill Mounjaro  - Continue dietary modifications to reduce simple carbohydrates  Hypertension Initial blood pressure was 150/75 but normalized to 135/78 on repeat measurement. She is managing her condition through lifestyle modifications. - Reinforce lifestyle modifications including diet and exercise  Emotional Eating Behaviors Patient is on bupropion  (Wellbutrin ) for emotional eating behaviors and reports doing well. She requests a refill. - Refill bupropion  (Wellbutrin )  General Health Maintenance Patient is proactive in her health management. - Encourage continued use of protein supplements like Premier Protein - Monitor for any new symptoms or changes in health status  Follow-up - Schedule follow-up visit before foot surgery - Monitor blood pressure and diabetes control at regular intervals.         She was informed of the importance of frequent follow up visits to maximize her success with intensive lifestyle modifications for her multiple health conditions.    Louann Penton, MD

## 2023-09-13 DIAGNOSIS — Z933 Colostomy status: Secondary | ICD-10-CM | POA: Diagnosis not present

## 2023-09-13 DIAGNOSIS — L89319 Pressure ulcer of right buttock, unspecified stage: Secondary | ICD-10-CM | POA: Diagnosis not present

## 2023-09-14 DIAGNOSIS — Z936 Other artificial openings of urinary tract status: Secondary | ICD-10-CM | POA: Diagnosis not present

## 2023-09-14 DIAGNOSIS — N390 Urinary tract infection, site not specified: Secondary | ICD-10-CM | POA: Diagnosis not present

## 2023-09-14 DIAGNOSIS — Z906 Acquired absence of other parts of urinary tract: Secondary | ICD-10-CM | POA: Diagnosis not present

## 2023-09-14 DIAGNOSIS — N1330 Unspecified hydronephrosis: Secondary | ICD-10-CM | POA: Diagnosis not present

## 2023-10-01 HISTORY — PX: FOOT SURGERY: SHX648

## 2023-10-05 DIAGNOSIS — L6611 Classic lichen planopilaris: Secondary | ICD-10-CM | POA: Diagnosis not present

## 2023-10-06 ENCOUNTER — Encounter: Payer: Self-pay | Admitting: Hematology and Oncology

## 2023-10-06 ENCOUNTER — Inpatient Hospital Stay: Payer: BC Managed Care – PPO

## 2023-10-06 ENCOUNTER — Inpatient Hospital Stay: Payer: BC Managed Care – PPO | Attending: Hematology and Oncology | Admitting: Hematology and Oncology

## 2023-10-06 VITALS — BP 123/56 | HR 71 | Temp 98.3°F | Resp 18 | Ht 61.0 in | Wt 198.8 lb

## 2023-10-06 DIAGNOSIS — D708 Other neutropenia: Secondary | ICD-10-CM | POA: Diagnosis not present

## 2023-10-06 DIAGNOSIS — Z87440 Personal history of urinary (tract) infections: Secondary | ICD-10-CM | POA: Diagnosis not present

## 2023-10-06 DIAGNOSIS — G8929 Other chronic pain: Secondary | ICD-10-CM | POA: Diagnosis not present

## 2023-10-06 DIAGNOSIS — Z853 Personal history of malignant neoplasm of breast: Secondary | ICD-10-CM

## 2023-10-06 DIAGNOSIS — Z9012 Acquired absence of left breast and nipple: Secondary | ICD-10-CM | POA: Diagnosis not present

## 2023-10-06 DIAGNOSIS — Z8542 Personal history of malignant neoplasm of other parts of uterus: Secondary | ICD-10-CM | POA: Diagnosis not present

## 2023-10-06 DIAGNOSIS — Z8543 Personal history of malignant neoplasm of ovary: Secondary | ICD-10-CM

## 2023-10-06 DIAGNOSIS — D709 Neutropenia, unspecified: Secondary | ICD-10-CM

## 2023-10-06 DIAGNOSIS — Z8701 Personal history of pneumonia (recurrent): Secondary | ICD-10-CM | POA: Insufficient documentation

## 2023-10-06 LAB — CBC WITH DIFFERENTIAL/PLATELET
Abs Immature Granulocytes: 0.03 10*3/uL (ref 0.00–0.07)
Basophils Absolute: 0.1 10*3/uL (ref 0.0–0.1)
Basophils Relative: 1 %
Eosinophils Absolute: 0.2 10*3/uL (ref 0.0–0.5)
Eosinophils Relative: 2 %
HCT: 40.1 % (ref 36.0–46.0)
Hemoglobin: 12.5 g/dL (ref 12.0–15.0)
Immature Granulocytes: 0 %
Lymphocytes Relative: 12 %
Lymphs Abs: 1.2 10*3/uL (ref 0.7–4.0)
MCH: 29.6 pg (ref 26.0–34.0)
MCHC: 31.2 g/dL (ref 30.0–36.0)
MCV: 95 fL (ref 80.0–100.0)
Monocytes Absolute: 0.4 10*3/uL (ref 0.1–1.0)
Monocytes Relative: 4 %
Neutro Abs: 7.8 10*3/uL — ABNORMAL HIGH (ref 1.7–7.7)
Neutrophils Relative %: 81 %
Platelets: 257 10*3/uL (ref 150–400)
RBC: 4.22 MIL/uL (ref 3.87–5.11)
RDW: 14.6 % (ref 11.5–15.5)
WBC: 9.7 10*3/uL (ref 4.0–10.5)
nRBC: 0 % (ref 0.0–0.2)

## 2023-10-06 MED ORDER — PREGABALIN 50 MG PO CAPS: 50.0000 mg | ORAL_CAPSULE | Freq: Two times a day (BID) | ORAL | Status: DC

## 2023-10-06 MED ORDER — NIVESTYM 480 MCG/0.8ML IJ SOSY: 480.0000 ug | PREFILLED_SYRINGE | INTRAMUSCULAR | 11 refills | Status: DC

## 2023-10-06 NOTE — Assessment & Plan Note (Addendum)
 She had history of breast cancer status post bilateral mastectomy and adjuvant aromatase inhibitor She does not need active surveillance

## 2023-10-06 NOTE — Assessment & Plan Note (Addendum)
 She is doing well with GCSF injection every 7 days She is doing well  She continues to have recurrent urinary tract infection related to her prior pelvic surgery and radiation She had an episode of pneumonia in January of this year although chest x-ray showed no evidence of pneumonia from January 2025  Her CBC is normal today We will continue G-CSF injection every 7 days and I will see her back in a year We discussed risk and benefits of trial of discontinuation of G-CSF

## 2023-10-06 NOTE — Assessment & Plan Note (Addendum)
 She will continue follow-up with GYN oncologist Her last CT imaging from April 2024 showed no evidence of recurrent disease

## 2023-10-06 NOTE — Progress Notes (Signed)
 [facility] OFFICE PROGRESS NOTE   Assessment & Plan Chronic neutropenia (HCC) She is doing well with GCSF injection every 7 days She is doing well  She continues to have recurrent urinary tract infection related to her prior pelvic surgery and radiation She had an episode of pneumonia in January of this year although chest x-ray showed no evidence of pneumonia from January 2025  Her CBC is normal today We will continue G-CSF injection every 7 days and I will see her back in a year We discussed risk and benefits of trial of discontinuation of G-CSF History of ovarian & endometrial cancer She will continue follow-up with GYN oncologist Her last CT imaging from April 2024 showed no evidence of recurrent disease History of left breast cancer She had history of breast cancer status post bilateral mastectomy and adjuvant aromatase inhibitor She does not need active surveillance  No orders of the defined types were placed in this encounter.    Artis Delay, MD  INTERVAL HISTORY: she returns for treatment follow-up She have history of breast cancer, ovarian cancer and endometrial cancer She also have chronic neutropenia due to excessive bone marrow suppression from prior chemotherapy requiring chronic G-CSF injection once a week Since last time I saw her, she continues to have history of recurrent urinary tract infection She was also treated with antibiotics in January 2025 for pneumonia  PHYSICAL EXAMINATION: ECOG PERFORMANCE STATUS: 1 - Symptomatic but completely ambulatory  Vitals:   10/06/23 1127  BP: (!) 123/56  Pulse: 71  Resp: 18  Temp: 98.3 F (36.8 C)  SpO2: 100%   Filed Weights   10/06/23 1127  Weight: 198 lb 12.8 oz (90.2 kg)    Relevant data reviewed during this visit included CBC with differential

## 2023-10-10 ENCOUNTER — Ambulatory Visit (INDEPENDENT_AMBULATORY_CARE_PROVIDER_SITE_OTHER): Payer: Medicare Other | Admitting: Family Medicine

## 2023-10-10 ENCOUNTER — Encounter (INDEPENDENT_AMBULATORY_CARE_PROVIDER_SITE_OTHER): Payer: Self-pay | Admitting: Family Medicine

## 2023-10-10 ENCOUNTER — Telehealth: Payer: Self-pay | Admitting: Urology

## 2023-10-10 VITALS — BP 133/74 | HR 64 | Temp 97.6°F | Ht 61.0 in | Wt 196.0 lb

## 2023-10-10 DIAGNOSIS — E1169 Type 2 diabetes mellitus with other specified complication: Secondary | ICD-10-CM

## 2023-10-10 DIAGNOSIS — E119 Type 2 diabetes mellitus without complications: Secondary | ICD-10-CM | POA: Diagnosis not present

## 2023-10-10 DIAGNOSIS — Z7985 Long-term (current) use of injectable non-insulin antidiabetic drugs: Secondary | ICD-10-CM

## 2023-10-10 DIAGNOSIS — Z6837 Body mass index (BMI) 37.0-37.9, adult: Secondary | ICD-10-CM

## 2023-10-10 DIAGNOSIS — F5089 Other specified eating disorder: Secondary | ICD-10-CM | POA: Diagnosis not present

## 2023-10-10 DIAGNOSIS — M2040 Other hammer toe(s) (acquired), unspecified foot: Secondary | ICD-10-CM

## 2023-10-10 DIAGNOSIS — M21619 Bunion of unspecified foot: Secondary | ICD-10-CM

## 2023-10-10 DIAGNOSIS — E669 Obesity, unspecified: Secondary | ICD-10-CM

## 2023-10-10 DIAGNOSIS — N184 Chronic kidney disease, stage 4 (severe): Secondary | ICD-10-CM | POA: Diagnosis not present

## 2023-10-10 DIAGNOSIS — F3289 Other specified depressive episodes: Secondary | ICD-10-CM

## 2023-10-10 MED ORDER — MOUNJARO 5 MG/0.5ML ~~LOC~~ SOAJ: 5.0000 mg | SUBCUTANEOUS | 0 refills | Status: DC

## 2023-10-10 MED ORDER — BUPROPION HCL 100 MG PO TABS: 100.0000 mg | ORAL_TABLET | Freq: Two times a day (BID) | ORAL | 0 refills | Status: DC

## 2023-10-10 NOTE — Progress Notes (Signed)
 Office: [phone]  /  Fax: [phone]  WEIGHT SUMMARY AND BIOMETRICS  Anthropometric Measurements Height: 5\' 1"  (1.549 m) Weight: 196 lb (88.9 kg) BMI (Calculated): 37.05 Weight at Last Visit: 202 lb Weight Lost Since Last Visit: 6 lb Weight Gained Since Last Visit: 0 lb Starting Weight: 208 lb Total Weight Loss (lbs): 12 lb (5.443 kg) Peak Weight: 300 lb   Body Composition  Body Fat %: 46.1 % Fat Mass (lbs): 90.4 lbs Muscle Mass (lbs): 100.4 lbs Total Body Water (lbs): 73.2 lbs Visceral Fat Rating : 15   Other Clinical Data Fasting: No Labs: No Today's Visit #: 53 Starting Date: 04/05/18    Chief Complaint: OBESITY   Discussed the use of AI scribe software for clinical note transcription with the patient, who gave verbal consent to proceed.  History of Present Illness   The patient, with obesity and type 2 diabetes, presents for obesity treatment and monitoring of progress.  She is actively engaged in a weight loss program, adhering to a diet plan of 1300 calories per day but is not eating much protein.. She has successfully lost six pounds over the past month. Her exercise routine includes walking for twenty to thirty minutes three times a week. She is concerned about maintaining her weight loss during her upcoming recovery period after foot surgery.  She has a history of type 2 diabetes, with a recent hemoglobin A1c of 6.4, indicating good control. She is focusing on dietary changes, including increasing simple carbohydrates, and regular exercise to manage her diabetes. She experiences occasional low blood sugar episodes, with a recent reading of 67 before lunch at church, and sometimes at night, which usually resolves without intervention. She occasionally consumes peanut butter crackers if her blood sugar does not stabilize.  She has a history of emotional eating behaviors and is currently being treated with Wellbutrin, 100 mg BID. She notes that Wellbutrin  has not affected her blood pressure, which is currently 133/74 mmHg, and she requests a refill of the medication.  She is preparing for foot surgery on October 17, 2023, to address a bunion, hammer toe, and arthritis in her joint. The procedure will involve straightening her toe, shaving off part of the bunion, and fixing the hammer toe with a pin. She will be in a boot for approximately six weeks post-surgery and is concerned about her ability to maintain her weight loss during this period of limited mobility.  She engages in knitting as a hobby, which helps her avoid snacking. She makes scarves for the homeless and participates in church activities. Her husband is also working on losing weight and has been supportive of her dietary changes.          PHYSICAL EXAM:  Blood pressure 133/74, pulse 64, temperature 97.6 F (36.4 C), height 5\' 1"  (1.549 m), weight 196 lb (88.9 kg), SpO2 99%. Body mass index is 37.03 kg/m.  DIAGNOSTIC DATA REVIEWED:  BMET    Component Value Date/Time   NA 138 08/22/2023 1015   NA 141 01/11/2022 1544   NA 141 01/24/2017 1228   K 4.3 08/22/2023 1015   K 4.6 01/24/2017 1228   CL 102 08/22/2023 1015   CO2 24 08/22/2023 1015   CO2 29 01/24/2017 1228   GLUCOSE 132 (H) 08/22/2023 1015   GLUCOSE 84 01/24/2017 1228   BUN 31 (H) 08/22/2023 1015   BUN 29 (H) 01/11/2022 1544   BUN 22.2 01/24/2017 1228   CREATININE 1.73 (H) 08/22/2023 1015  CREATININE 2.20 (H) 06/04/2022 1450   CREATININE 1.91 (H) 01/18/2020 1440   CREATININE 0.8 01/24/2017 1228   CALCIUM 9.5 08/22/2023 1015   CALCIUM 10.2 01/24/2017 1228   GFRNONAA 24 (L) 06/04/2022 1450   GFRNONAA 78 12/16/2014 1530   GFRAA 38 (L) 06/16/2020 0834   GFRAA 30 (L) 03/27/2018 1324   GFRAA >89 12/16/2014 1530   Lab Results  Component Value Date   HGBA1C 6.4 08/22/2023   HGBA1C 6.3 (H) 11/07/2014   Lab Results  Component Value Date   INSULIN 14.1 06/16/2020   Lab Results  Component Value Date   TSH  1.23 08/22/2023   CBC    Component Value Date/Time   WBC 9.7 10/06/2023 1112   RBC 4.22 10/06/2023 1112   HGB 12.5 10/06/2023 1112   HGB 11.3 (L) 06/04/2022 1450   HGB 11.5 01/11/2022 1544   HGB 12.4 07/29/2017 1444   HCT 40.1 10/06/2023 1112   HCT 35.5 01/11/2022 1544   HCT 38.0 07/29/2017 1444   PLT 257 10/06/2023 1112   PLT 184 06/04/2022 1450   PLT 207 01/11/2022 1544   MCV 95.0 10/06/2023 1112   MCV 94 01/11/2022 1544   MCV 90.9 07/29/2017 1444   MCH 29.6 10/06/2023 1112   MCHC 31.2 10/06/2023 1112   RDW 14.6 10/06/2023 1112   RDW 13.1 01/11/2022 1544   RDW 15.5 (H) 07/29/2017 1444   Iron Studies    Component Value Date/Time   IRON 7 (L) 08/31/2017 0451   IRON 12 (L) 11/29/2014 1251   TIBC 164 (L) 08/31/2017 0451   TIBC 331 11/29/2014 1251   FERRITIN 27 11/29/2014 1251   IRONPCTSAT 4 (L) 08/31/2017 0451   IRONPCTSAT 4 (L) 11/29/2014 1251   Lipid Panel     Component Value Date/Time   CHOL 77 02/17/2023 0948   CHOL 92 (L) 06/16/2020 0834   TRIG 118.0 02/17/2023 0948   HDL 39.20 02/17/2023 0948   HDL 49 06/16/2020 0834   CHOLHDL 2 02/17/2023 0948   VLDL 23.6 02/17/2023 0948   LDLCALC 14 02/17/2023 0948   LDLCALC 21 06/16/2020 0834   LDLCALC 13 01/18/2020 1440   Hepatic Function Panel     Component Value Date/Time   PROT 7.5 08/22/2023 1015   PROT 7.8 06/16/2020 0834   PROT 6.9 01/24/2017 1228   ALBUMIN 3.9 08/22/2023 1015   ALBUMIN 4.4 06/16/2020 0834   ALBUMIN 3.4 (L) 01/24/2017 1228   AST 18 08/22/2023 1015   AST 12 (L) 06/04/2022 1450   AST 16 01/24/2017 1228   ALT 24 08/22/2023 1015   ALT 14 06/04/2022 1450   ALT 14 01/24/2017 1228   ALKPHOS 159 (H) 08/22/2023 1015   ALKPHOS 90 01/24/2017 1228   BILITOT 0.4 08/22/2023 1015   BILITOT 0.6 06/04/2022 1450   BILITOT 0.34 01/24/2017 1228      Component Value Date/Time   TSH 1.23 08/22/2023 1015   Nutritional Lab Results  Component Value Date   VD25OH 44.37 08/22/2023   VD25OH 36.40  02/17/2023   VD25OH 31.09 02/15/2022     Assessment and Plan    Obesity Following a 1300 calorie diet but over-restricting her protein intake per day. Successfully lost 6 pounds in the last month and is walking for exercise 20-30 minutes three times per week. Concerned about protein intake due to kidney disease but likely consuming more than she thinks. Emphasized the importance of adequate protein intake to prevent muscle loss and maintain metabolism. - Continue 1300 calorie  diet - Ensure protein intake is around 70 grams per day - Track protein intake for a few days to confirm adequacy - Continue walking for exercise  Type 2 Diabetes Mellitus Most recent hemoglobin A1c was 6.4. Managing diabetes through diet, exercise, and weight loss. Experiences occasional nocturnal hypoglycemia, generally resolving without intervention. Discussed the potential benefit of a small protein snack at bedtime to prevent nocturnal hypoglycemia. - Continue current diet and exercise regimen - Consider a small protein snack at bedtime to prevent nocturnal hypoglycemia - Refill Mounjaro  Kidney Disease Advised by another physician to reduce protein intake.  Based on current nephrology guidelines, a patient with diabetes in stage 4 CKD should eat 0.8 gm of protein per kg weight per day to prevent muscle loss, this would be 70 grams of protein daily. Discussed the importance of balanced protein intake to avoid muscle wasting and maintain overall health.  - Ensure protein intake is around 70-80 grams per day -Pescatarian plan given to the patient meets these criteria - Monitor kidney function as needed  Emotional Eating Being treated with Wellbutrin 100 mg BID. Reports that the medication has not raised her blood pressure, which is currently 133/74. - Refill Wellbutrin 100 mg BID  Bunion and Hammer Toe Scheduled for foot surgery on October 17, 2023, to address a bunion and hammer toe. The procedure will involve  straightening the toe, shaving off part of the bunion, and fixing the hammer toe with a pin. Discussed the anticipated 6-week recovery period in a boot and the importance of balancing weight post-surgery to prevent muscle imbalance. - Prepare for foot surgery on October 17, 2023 - Weigh the boot before surgery to balance weight post-surgery - Consider using an ankle weight on the other foot for balance and strengthening post-surgery - Plan for 6 weeks in a boot post-surgery  General Health Maintenance Actively engaged in weight loss and diabetes management. Preparing for upcoming foot surgery and has strategies in place to manage diet and activity levels during recovery. Discussed using knitting as a strategy to avoid snacking and setting an alarm to remind to drink fluids regularly. - Continue knitting as a strategy to avoid snacking - Set an alarm to remind to drink fluids regularly  Follow-up - Schedule follow-up appointment in 4-5 weeks.         I have personally spent 54 minutes total time today in preparation, patient care, current guideline review and documentation for this visit, including the following: review of clinical lab tests; review of medical tests/procedures/services.   She was informed of the importance of frequent follow up visits to maximize her success with intensive lifestyle modifications for her multiple health conditions.    Quillian Quince, MD

## 2023-10-10 NOTE — Telephone Encounter (Signed)
 DOS 10/17/23   HAMMER REPAIR 2ND LEFT --- 16109 HALLUX MPJ FUSION LEFT --- 60454 CAPSULOTOMY MPJ 2ND LEFT --- [id number]  [employer]  SPOKE WITH ANA T., SHE STATED CPT CODE 09811 NO AUTH REQUIRED, CPT CODES [id number] AND [phone] NEED TO BE APPROVED THROUGH [employer].  CALL REF # ANA T. 09/13/23 AT 9:22 AM EST   PER [employer] CPT CODES 29562 AND 28750 HAVE BEEN APPROVED, AUTH # [id number] AND [id number], GOOD FROM 10/17/23 - 12/15/23.

## 2023-10-13 DIAGNOSIS — E1122 Type 2 diabetes mellitus with diabetic chronic kidney disease: Secondary | ICD-10-CM | POA: Diagnosis not present

## 2023-10-13 DIAGNOSIS — I129 Hypertensive chronic kidney disease with stage 1 through stage 4 chronic kidney disease, or unspecified chronic kidney disease: Secondary | ICD-10-CM | POA: Diagnosis not present

## 2023-10-13 DIAGNOSIS — I10 Essential (primary) hypertension: Secondary | ICD-10-CM | POA: Diagnosis not present

## 2023-10-13 DIAGNOSIS — N1832 Chronic kidney disease, stage 3b: Secondary | ICD-10-CM | POA: Diagnosis not present

## 2023-10-17 ENCOUNTER — Other Ambulatory Visit: Payer: Self-pay | Admitting: Podiatry

## 2023-10-17 DIAGNOSIS — E119 Type 2 diabetes mellitus without complications: Secondary | ICD-10-CM | POA: Diagnosis not present

## 2023-10-17 DIAGNOSIS — M12572 Traumatic arthropathy, left ankle and foot: Secondary | ICD-10-CM | POA: Diagnosis not present

## 2023-10-17 DIAGNOSIS — M7752 Other enthesopathy of left foot: Secondary | ICD-10-CM | POA: Diagnosis not present

## 2023-10-17 DIAGNOSIS — M24575 Contracture, left foot: Secondary | ICD-10-CM | POA: Diagnosis not present

## 2023-10-17 DIAGNOSIS — M2012 Hallux valgus (acquired), left foot: Secondary | ICD-10-CM | POA: Diagnosis not present

## 2023-10-17 DIAGNOSIS — M2042 Other hammer toe(s) (acquired), left foot: Secondary | ICD-10-CM | POA: Diagnosis not present

## 2023-10-17 MED ORDER — HYDROCODONE-ACETAMINOPHEN 5-325 MG PO TABS: 1.0000 | ORAL_TABLET | Freq: Four times a day (QID) | ORAL | 0 refills | Status: DC | PRN

## 2023-10-17 MED ORDER — IBUPROFEN 800 MG PO TABS: 800.0000 mg | ORAL_TABLET | Freq: Four times a day (QID) | ORAL | 1 refills | Status: AC | PRN

## 2023-10-20 DIAGNOSIS — Z933 Colostomy status: Secondary | ICD-10-CM | POA: Diagnosis not present

## 2023-10-20 DIAGNOSIS — Z936 Other artificial openings of urinary tract status: Secondary | ICD-10-CM | POA: Diagnosis not present

## 2023-10-20 DIAGNOSIS — L89319 Pressure ulcer of right buttock, unspecified stage: Secondary | ICD-10-CM | POA: Diagnosis not present

## 2023-10-21 ENCOUNTER — Other Ambulatory Visit: Payer: Self-pay | Admitting: Family Medicine

## 2023-10-21 DIAGNOSIS — G8929 Other chronic pain: Secondary | ICD-10-CM

## 2023-10-24 NOTE — Telephone Encounter (Signed)
 Requesting: [facility] Contract: 2019 UDS: 2019 Last OV: 08/22/2023 Next OV: 02/23/2024 Last Refill: 10/06/2023 from Dr. Bertis Ruddy Database:   Please advise

## 2023-10-26 ENCOUNTER — Ambulatory Visit (INDEPENDENT_AMBULATORY_CARE_PROVIDER_SITE_OTHER): Payer: Medicare Other | Admitting: Podiatry

## 2023-10-26 ENCOUNTER — Ambulatory Visit (INDEPENDENT_AMBULATORY_CARE_PROVIDER_SITE_OTHER)

## 2023-10-26 DIAGNOSIS — M7752 Other enthesopathy of left foot: Secondary | ICD-10-CM | POA: Diagnosis not present

## 2023-10-26 DIAGNOSIS — M2042 Other hammer toe(s) (acquired), left foot: Secondary | ICD-10-CM

## 2023-10-26 DIAGNOSIS — M24575 Contracture, left foot: Secondary | ICD-10-CM

## 2023-10-26 DIAGNOSIS — Z9889 Other specified postprocedural states: Secondary | ICD-10-CM

## 2023-10-26 MED ORDER — HYDROCODONE-ACETAMINOPHEN 5-325 MG PO TABS: 1.0000 | ORAL_TABLET | Freq: Four times a day (QID) | ORAL | 0 refills | Status: DC | PRN

## 2023-10-26 MED ORDER — DOXYCYCLINE HYCLATE 100 MG PO TABS: 100.0000 mg | ORAL_TABLET | Freq: Two times a day (BID) | ORAL | 0 refills | Status: DC

## 2023-10-26 NOTE — Progress Notes (Signed)
 Subjective:  Patient ID: [name], female    DOB: Apr 02, 1956,  MRN: [id number]  Chief Complaint  Patient presents with   Routine Post Op    DOS 10/17/23 --- LEFT 1ST MPJ FUSION WITH LEFT 2ND DIGIT HAMMERTOE ARTHROPLASTY WITH 2ND MPJ CAPSULOTOMY WITH FXATION    DOS: 10/17/2023 Procedure: Left first metatarsophalangeal joint fusion with left second digit arthroplasty with capsulotomy of the second metatarsophalangeal joint  68 y.o. female returns for post-op check.  The patient states that she is doing well denies any other acute complaints her pain is controlled.  Bandages clean dry and intact  Review of Systems: Negative except as noted in the HPI. Denies N/V/F/Ch.  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(TAH w/ BSO) :  recurrent 11-19-2014 post pelvic surgery and radiation 01-29-2015 to 03-10-2015   Chronic idiopathic neutropenia (HCC)    presumed related to chemotherapy March 2006--- followed by dr Bertis Ruddy (treatment w/ G-CSF injections   Chronic nausea    CKD (chronic kidney disease), stage III [facility])    nephrologist-  dr Bufford Buttner   Difficult intravenous access    small veins--- hx PICC lines   DM type 2 (diabetes mellitus, type 2) (HCC)    endocriologist--  dr Joselyn Glassman Janee Morn   (08-18-2021  per pt checks blood sugar daily in am,  fasting sugar--92--130)   Dysrhythmia    Environmental and seasonal allergies    Generalized muscle weakness    GERD  (gastroesophageal reflux disease)    Hiatal hernia    History of abdominal abscess 04/16/2017   post surgery 04-01-2017  --- resolved 10/ 2018   History of COVID-19 05/2021   per pt mild symptoms that resolved   History of gastric polyp    2014  duodenum   History of ileus 04/16/2017   resolved w/ no surgical intervention   History of radiation therapy    01-29-2015 to 03-10-2015  pelvis 50.4Gy   History of small bowel obstruction 01/2019   Hypothyroidism    monitored by dr Casimiro Needle altheimer   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 Gerrit Friends   PAF (paroxysmal atrial fibrillation) [facility])    cardiologist--- dr h. 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   Vitamin D deficiency    Vulval lesion    Wears glasses     Current Outpatient Medications:    doxycycline (VIBRA-TABS) 100 MG tablet, Take 1 tablet (100 mg total) by mouth 2 (two)  times daily., Disp: 20 tablet, Rfl: 0   HYDROcodone-acetaminophen (NORCO/VICODIN) 5-325 MG tablet, Take 1 tablet by mouth every 6 (six) hours as needed for moderate pain (pain score 4-6)., Disp: 30 tablet, Rfl: 0   aspirin EC 81 MG tablet, Take 1 tablet (81 mg total) by mouth daily. Swallow whole., Disp: 90 tablet, Rfl: 3   Biotin 5 MG TABS, Take 5 mg by mouth daily. , Disp: , Rfl:    buPROPion (WELLBUTRIN) 100 MG tablet, Take 1 tablet (100 mg total) by mouth 2 (two) times daily., Disp: 180 tablet, Rfl: 0   calcium citrate-vitamin D (CITRACAL+D) 315-200 MG-UNIT tablet, Take 1 tablet by mouth daily.,  Disp: , Rfl:    Cholecalciferol (VITAMIN D3) 10000 UNITS capsule, Take 10,000 Units by mouth See admin instructions. Take 1 tablet by mouth 43times per week., Disp: , Rfl:    clobetasol (OLUX) 0.05 % topical foam, Apply topically 2 (two) times daily., Disp: 50 g, Rfl: 3   Continuous Blood Gluc Sensor (FREESTYLE LIBRE 3 SENSOR) MISC, Inject 1 sensor to the skin every 14 days for continuous glucose monitoring., Disp: , Rfl:    Cranberry-Vitamin C 15000-100 MG CAPS, Take by mouth daily. 1 in Morning and 1 in PM, Disp: , Rfl:    famotidine-calcium carbonate-magnesium hydroxide (PEPCID COMPLETE) 10-800-165 MG chewable tablet, Chew 1 tablet by mouth as needed., Disp: , Rfl:    ferrous sulfate 325 (65 FE) MG tablet, Take 1 tablet (325 mg total) by mouth at bedtime., Disp: 30 tablet, Rfl: 3   filgrastim-aafi (NIVESTYM) 480 MCG/0.8ML SOSY injection, Inject 0.8 mLs (480 mcg total) into the skin every 7 (seven) days., Disp: 3.2 mL, Rfl: 11   fluticasone (FLONASE) 50 MCG/ACT nasal spray, Place 2 sprays into both nostrils daily., Disp: 16 g, Rfl: 0   HYDROcodone-acetaminophen (NORCO/VICODIN) 5-325 MG tablet, Take 1 tablet by mouth every 6 (six) hours as needed for moderate pain (pain score 4-6)., Disp: 30 tablet, Rfl: 0   ibuprofen (ADVIL) 800 MG tablet, Take 1 tablet (800 mg total) by mouth every 6 (six) hours as needed., Disp: 60 tablet, Rfl: 1   levothyroxine (SYNTHROID, LEVOTHROID) 150 MCG tablet, Take 1 tablet (150 mcg total) by mouth daily before breakfast., Disp: 30 tablet, Rfl: 1   loratadine (CLARITIN) 10 MG tablet, Take 10 mg by mouth daily. , Disp: , Rfl:    MOUNJARO 5 MG/0.5ML Pen, Inject 5 mg into the skin once a week., Disp: 6 mL, Rfl: 0   moxifloxacin (VIGAMOX) 0.5 % ophthalmic solution, Place 1 drop into both eyes 3 (three) times daily., Disp: 3 mL, Rfl: 0   omega-3 acid ethyl esters (LOVAZA) 1 G capsule, Take 1 g by mouth 2 (two) times daily. , Disp: , Rfl:    Polyethyl Glycol-Propyl Glycol  (SYSTANE OP), Place 1 drop into both eyes daily as needed (dry eyes). , Disp: , Rfl:    pregabalin (LYRICA) 50 MG capsule, Take 1 capsule by mouth twice daily., Disp: 60 capsule, Rfl: 4   Prenatal Vit-Fe Fumarate-FA (PRENATAL VITAMIN PO), Take 1 capsule by mouth daily. Takes prenatal because there are no dyes in it, Disp: , Rfl:    Probiotic Product (ALIGN PO), Take 1 capsule by mouth daily., Disp: , Rfl:    rosuvastatin (CRESTOR) 10 MG tablet, Take 10 mg by mouth every evening., Disp: , Rfl:    Saccharomyces boulardii (FLORASTOR PO), Take 1 capsule by mouth at bedtime., Disp: , Rfl:    Tazarotene (ARAZLO) 0.045 %  LOTN, Apply 1 application topically daily., Disp: 45 g, Rfl: 0   tiZANidine (ZANAFLEX) 4 MG tablet, Take 0.5-1 tablets (2-4 mg total) by mouth every 8 (eight) hours as needed for muscle spasms., Disp: 180 tablet, Rfl: 3   XIFAXAN 550 MG TABS tablet, Take 550 mg by mouth as needed., Disp: , Rfl:   Social History   Tobacco Use  Smoking Status Never   Passive exposure: Never  Smokeless Tobacco Never    Allergies  Allergen Reactions   Penicillins Hives and Swelling    Facial swelling/childhood allergy Has patient had a PCN reaction causing immediate rash, facial/tongue/throat swelling, SOB or lightheadedness with hypotension: Yes Has patient had a PCN reaction causing severe rash involving mucus membranes or skin necrosis: Yes Has patient had a PCN reaction that required hospitalization yes Has patient had a PCN reaction occurring within the last 10 years: No If all of the above answers are "NO", then may proceed with Cephalosporin use.    Erythromycin Other (See Comments)    Gastritis, abd cramps   Tape Rash    blisters   Trimethoprim Rash   Ultram [Tramadol] Hives   Zarxio [Filgrastim] Other (See Comments)    Post injection, elevated heart rate, body feeling unwell, uneasy.    Cephalosporins Rash   Fluconazole Rash   Neomycin Rash    blisters   Oxycodone Other (See  Comments)    " I just feel weird"   Pectin Rash    Pectin ring for stoma   Septra [Sulfamethoxazole-Trimethoprim] Rash   Sulfa Antibiotics Rash   Objective:  There were no vitals filed for this visit. There is no height or weight on file to calculate BMI. Constitutional Well developed. Well nourished.  Vascular Foot warm and well perfused. Capillary refill normal to all digits.   Neurologic Normal speech. Oriented to person, place, and time. Epicritic sensation to light touch grossly present bilaterally.  Dermatologic Skin healing well without signs of infection. Skin edges well coapted without signs of infection.  Orthopedic: Tenderness to palpation noted about the surgical site.   Radiographs: 3 views of skeletally mature adult left foot: Hardware is intact no signs of backing or loosening noted.  Good correction alignment noted. Assessment:   1. Capsulitis of metatarsophalangeal (MTP) joint of left foot   2. Hammertoe of second toe of left foot   3. Joint contracture of foot, left   4. Status post foot surgery    Plan:  Patient was evaluated and treated and all questions answered.  S/p foot surgery left -Progressing as expected post-operatively. -XR: See above -WB Status: Nonweight in the left lower extremity with a knee scooter -Sutures: Intact.  No clinical signs of Deis and no no complication noted. -Medications: None -Foot redressed.  No follow-ups on file.

## 2023-10-27 DIAGNOSIS — Z466 Encounter for fitting and adjustment of urinary device: Secondary | ICD-10-CM | POA: Diagnosis not present

## 2023-10-27 DIAGNOSIS — Z936 Other artificial openings of urinary tract status: Secondary | ICD-10-CM | POA: Diagnosis not present

## 2023-11-03 ENCOUNTER — Encounter: Payer: Self-pay | Admitting: Gynecologic Oncology

## 2023-11-04 ENCOUNTER — Encounter: Payer: Self-pay | Admitting: Gynecologic Oncology

## 2023-11-04 ENCOUNTER — Inpatient Hospital Stay: Payer: Self-pay | Attending: Hematology and Oncology | Admitting: Gynecologic Oncology

## 2023-11-04 VITALS — BP 132/64 | HR 73 | Temp 97.6°F | Resp 19

## 2023-11-04 DIAGNOSIS — Z170 Estrogen receptor positive status [ER+]: Secondary | ICD-10-CM | POA: Insufficient documentation

## 2023-11-04 DIAGNOSIS — Z9013 Acquired absence of bilateral breasts and nipples: Secondary | ICD-10-CM | POA: Diagnosis not present

## 2023-11-04 DIAGNOSIS — Z8543 Personal history of malignant neoplasm of ovary: Secondary | ICD-10-CM

## 2023-11-04 DIAGNOSIS — Z90722 Acquired absence of ovaries, bilateral: Secondary | ICD-10-CM | POA: Diagnosis not present

## 2023-11-04 DIAGNOSIS — C50919 Malignant neoplasm of unspecified site of unspecified female breast: Secondary | ICD-10-CM | POA: Insufficient documentation

## 2023-11-04 DIAGNOSIS — Z8542 Personal history of malignant neoplasm of other parts of uterus: Secondary | ICD-10-CM

## 2023-11-04 DIAGNOSIS — Z9221 Personal history of antineoplastic chemotherapy: Secondary | ICD-10-CM | POA: Diagnosis not present

## 2023-11-04 DIAGNOSIS — Z90710 Acquired absence of both cervix and uterus: Secondary | ICD-10-CM | POA: Diagnosis not present

## 2023-11-04 DIAGNOSIS — Z923 Personal history of irradiation: Secondary | ICD-10-CM | POA: Insufficient documentation

## 2023-11-04 DIAGNOSIS — Z9079 Acquired absence of other genital organ(s): Secondary | ICD-10-CM | POA: Insufficient documentation

## 2023-11-04 DIAGNOSIS — Z1732 Human epidermal growth factor receptor 2 negative status: Secondary | ICD-10-CM | POA: Insufficient documentation

## 2023-11-04 DIAGNOSIS — Z79811 Long term (current) use of aromatase inhibitors: Secondary | ICD-10-CM | POA: Diagnosis not present

## 2023-11-04 NOTE — Progress Notes (Signed)
 Gynecologic Oncology Return Clinic Visit  11/04/23  Reason for Visit: surveillance  Treatment History: Oncology History Overview Note  Breast cancer of upper-inner quadrant of left female breast [facility])   Staging form: Breast, AJCC 7th Edition     Clinical stage from 12/31/2015: Stage IIA (T2, N0, M0) - Signed by Artis Delay, MD on 12/31/2015  ER,PR positive Her2 neg   History of ovarian & endometrial cancer  10/15/2004 Initial Diagnosis   History of ovarian cancer, treated with chemotherapy carbo/Taxol   03/17/2009 Bone Marrow Biopsy   Bone marrow biopsy at [facility] showed neutropenia   01/23/2014 Bone Marrow Biopsy   Repeat bone marrow biopsy showed neutropenia   09/12/2014 Imaging   CT scan showed possible cancer   10/14/2014 Imaging   MRI show significant pelvic mass without invasion into the bladder but abutting to the rectum   11/19/2014 Surgery   she underwent surgery and had robotic-assisted lysis of adhesions, converted to laparotomy, radical upper vaginectomy and low anterior resection with colostomy. 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 Resection margins were negative   01/29/2015 - 03/10/2015 Radiation Therapy   She received adjuvant radiation   03/03/2015 Imaging   CT scan of the abdomen and pelvis showed status post interval removal of the bilateral nephroureteral stents. Worsening moderate right hydroureteronephrosis. Resolved left hydroureteronephrosis.   03/20/2015 Procedure   she had cystoscopy and stent placement for right hydronephrosis   05/02/2015 Surgery   Cystoscopy with right retrograde pyelogram interpretation. Diagnostic ureteroscopy. Removal of right ureteral stent.   05/14/2015 Imaging   CT scan of the abdomen and pelvis show unilateral right hydronephrosis  with no residual cancer   08/18/2015 Imaging   CT scan showed hysterectomy with stable presacral soft tissue thickening. No definitive evidence of recurrent or metastatic disease. Severe right hydronephrosis   09/12/2015 Surgery   Cystoscopy with right retrograde pyelogram interpretation. Right ureteral stent placement 5 x 24 Polaris, no tether   10/07/2016 Imaging   Stable exam.  No new or progressive findings. 2. Stable appearance abnormal presacral soft tissue compatible with post treatment change. 3. Internal right ureteral stent with persistent right hydroureteronephrosis. Differentially decreased perfusion of the right kidney suggests a component of underlying obstructive uropathy. 4. Stable 13 mm left adrenal nodule, previously characterized as adenoma.       01/27/2017 Imaging   Stable postop and post radiation changes in presacral region. No evidence of recurrent or metastatic carcinoma within the chest, abdomen, or pelvis. Stable mild to moderate right hydroureteronephrosis, with right ureteral stent in appropriate position. Stable small benign left adrenal adenoma and left thyroid lobe nodule.   10/02/2019 Tumor Marker   Patient's tumor was tested for the following markers: CA-125 Results of the tumor marker test revealed 5.8   Breast cancer of upper-inner quadrant of left female breast (HCC)  10/14/2015 Imaging   Screening mammogram showed possible distortion in the left breast.   10/24/2015 Imaging   Targeted ultrasound is performed, showing a 0.6 x 0.8 x 0.9 cm area of hypoechoic distortion at the 10 o'clock position of the left breast 5 cm from the nipple   10/24/2015 Imaging   Diagnostic imaging confirmed 0.6 x 0.8 x 0.9 cm distortion in the upper inner left breast   10/30/2015 Procedure   Left US guided biopsy was performed   10/30/2015 Pathology Results  Accession: [id number] showed invasive ductal carcinoma, ER/PR positive, Her2 neg   11/05/2015 Genetic Testing   Genetic  testing did not reveal a deleterious mutation.  Genetic testing did detect a Variant of Unknown Significance in the MSH6 gene called c.389A>G..  Genes tested include: APC, ATM, AXIN2, BARD1, BMPR1A, BRCA1, BRCA2, BRIP1, CDH1, CDKN2A, CHEK2, DICER1, EPCAM, GREM1, KIT, MEN1, MLH1, MSH2, MSH6, MUTYH, NBN, NF1, PALB2, PDGFRA, PMS2, POLD1, POLE, PTEN, RAD50, RAD51C, RAD51D, SDHA, SDHB, SDHC, SDHD, SMAD4, SMARCA4. STK11, TP53, TSC1, TSC2, and VHL.  UPDATE: MSH6 c.389A>G (p.His130Arg) has been reclassified as Likely Benign.  The amended report date is February 25, 2021.   12/11/2015 Surgery   She undwerwent bilateral mastectomy, left SLN biopsy and immediate reconstruction surgeries with bilateral plasma expanders   12/11/2015 Pathology Results   Accession: [id number] mastectomy specimens showed left breast ca, pT2N0M0   12/26/2015 Pathology Results   Accession: [id number] specimen from debridement showed no cancer   12/26/2015 Surgery   she underwent surgical debridement of necrotic tissue at site of plasma expander   01/12/2016 Imaging   Bone density is normal   02/02/2016 -  Anti-estrogen oral therapy   She started taking Arimidex   10/21/2017 Procedure   Ultrasound guided biopsy of a vague hypoechoic shadowing lesion, generally corresponding to area palpable abnormality, along chest lateral to the reconstructed left breast and inferior to the left axilla. No apparent complications.   01/17/2018 Imaging   Bone density scan is normal    She has a history of endometrial and ovarian endometrioid carcinoma treated in 2006 by Dr. Hazle Coca at [facility] in [place]. Her surgery (TAH, BSO) was followed by adjuvant chemotherapy with carboplatin plus paclitaxel due to the ovarian involvement and the presence of a cul de sac lesion also positive for disease. She denies receiving adjuvant radiation therapy. She is unclear if she had metastatic endometrial cancer to the ovary or duel primaries.  She had a complete response to therapy however developed leukopenia in July 2010. 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 She sees Dr. Bertis Ruddy for this and is treated with G-CSF injections.   She began experiencing rectal pain approximately in January 2016. She also reports narrowing of caliber of the stool. She denies hematochezia. She does report approximately 3 months of vaginal spotting. She's had no specific follow-up for her gynecologic cancers in the past 4 years.   As part of workup of her rectal pain she underwent a CT scan of the abdomen and pelvis on 09/12/2014. This demonstrated a new right perirectal mass abutting the vaginal cuff measuring 3.8 x 4.9 cm. There is a limited fat plane between the mass and the rectum posteriorly. Rectal invasion could not be excluded. There were no other masses identified in the abdomen and pelvis or lymphadenopathy. There is no other evidence of metastatic disease or recurrent disease. There was no hydronephrosis. A CA-125 drawn on 09/12/2014 was normal at 10.   PET was negative for extrapelvic disease. MRI defined the lesion as a 3.5x5cm lesion to the right of the rectum at the vaginal cuff.   Colonoscopy was performed on 10/02/14 with transrectal [place] and biopsy and this revealed endometrioid adenocarcinoma. Of note, the lesion was not seen within the lumen of the rectum.    She then underwent a posterior supralevator exenteration with colostomy and bladder repair and stent placement on 11/19/14 without complications with Dr Andrey Farmer and Dr  Gross.  Her postoperative course was uncomplicate with the exception of development of postop anemia.  Her final pathology revealed endometrioid adenocarcinoma invading the vagina and rectum with negative margins on the specimen. The margin had been close (clinically) to the right pelvic sidewall which was marked  with surgical clips.   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 It was treated with IV antibiotics and then on POD 15 she was taken to the OR for laparoscopic revision of the stoma with Dr Karie Soda. Postoperatively she had wound vac and packing for her stomal wound.   A retrograde cystogram on week 4 postop confirmed an intact bladder and the foley was removed.   She initially had some voiding issues with decreased sensation to void. We tested a post void residual in May, 2016 and this revealed adequate voiding.   On 12/30/14 she was admitted to [facility] with sepsis associated with Enterobacter Cloacae UTI. This was treated with IV antibiotics and then a prolonged course of oral cipro. Imaging performed at the time of admission (a CT of the abdo/pelvis) revealed: Bilateral double-J internal ureteral stents in adequate position with persistent mild bilateral hydronephrosis    She complete radiation therapy from 01/29/15 to 03/10/15 with 50Gy of external beam radiation and IMRT. She tolerated therapy well with minor skin irritation.   She had her ureteral stents removed in June, 2016, however, then developed right ureteral obstruction, hydroureter and pain. She went to the OR with Urologist Dr Berneice Heinrich on 03/20/15 for ureteral dilation and right stent placement (the left was draining well). No tumor was seen on cysto.    CT abdo/pelvis on 05/14/15 showed: no new lesions, stable thickening in right pelvis/distal right ureter consistent with radiation effect. The right ureteral stent was removed.   The patient's CT in January 2017 showed progression of her right ureteral obstruction after stent removal. Dr Berneice Heinrich took her to the OR on 09/05/15 for a cystoscopy, retrograde pyelogram and right ureteral stent placement.    The CT on 08/18/15 showed decreased attenuation of the right renal parenchyma, right hydronephrosis that was  severe no excretion of contrast on that for graphic phase imaging. The perivascular soft tissue thickening in the pelvis and presacral regions grossly stable consistent with radiation changes.   The patient was diagnosed with ER/PR positive left breast cancer in April 2017. Surgery is scheduled for May. She underwent bilateral mastectomy with expander reconstruction. Postoperatively she was treated with Arimidex.    Repeat CT imaging on 02/08/16 revealed : Stable post treatment changes in presacral region. No evidence of recurrent or metastatic carcinoma within the abdomen or pelvis. Interval placement of right ureteral stent in appropriate position. Moderate right hydroureteronephrosis remains stable. Stable benign left adrenal adenoma and hepatic steatosis.   On 06/30/16 she had replacement of her right ureteral stent with Dr Berneice Heinrich.   On 10/07/16 CT surveillance was performed which showed: Stable exam.  No new or progressive findings. Stable appearance abnormal presacral soft tissue compatible with post treatment change. Internal right ureteral stent with persistent right hydroureteronephrosis. Differentially decreased perfusion of the right kidney suggests a component of underlying obstructive uropathy. Stable 13 mm left adrenal nodule, previously characterized as adenoma. A parastomal hernia was noted.   CT 01/27/17 showed stable findings with no evidence of recurrence. Persistent right hydroureter was again noted.   Her colostomy bothered her greatly and she hoped to be free of stomal  appliances. She was frustrated with repetitive stent replacements and desired definitive reconstruction/reimplantation of the right ureter.     Given that a 2 year disease free interval had elapsed, in 04/01/17 Dr Berneice Heinrich and Dr Michaell Cowing returned to the operating room with Ms Bivens for a right ureteral resection, and bladder hitch with reimplantation and reversal of hartman's with temporary ileostomy complicated by postop  development of urinoma which required a peritoneal drain and right ureteral stent.   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 She was offered permanent diversion with ileal conduit and permanent end colostomy. This was not considered a reasonable option at that time by [facility]. She became quite depressed by the slow recovery and sequelae of her last surgery. She sought second opinion with Gynecologic Oncology at [facility]. They offered referral to urology and colorectal specialists at [facility]. They recommended no additional cancer treatment as she was disease free. She is being worked up by them for possible laparotomy, revision of right uretero-neocystotomy, placement of VRAM flap, and possible bowel resection and revision of ileostomy. Prior to embarking on this surgery they requested she lose weight to a BMI of <30kg/m2.   She had lost weight to a BMI <30 throughout 2020. She experienced some episodes of passage of bloody mucous from the vagina in May, 2020 and so an CT scan was performed which showed no signs of pelvic recurrence.    In September the patient embarked on a large procedure at [facility].  She underwent an exploratory laparotomy panniculectomy cystectomy creation of urinary conduit with ileum, proctectomy, and formation of permanent and colostomy.  The procedure was uncomplicated.  She reported that while plastic surgery had planned to take part in the procedure with placement of a flap, it was determined that this was not necessary intraoperatively.  While the procedure note in Care Everywhere states that a hysterectomy was performed, this had already been performed more than a decade prior.  Additionally there was no myocutaneous flap placed despite the procedure reporting as such.  No malignancy was identified in the specimens.   The patient had an  uncomplicated procedure overall and a 7-day hospital stay with subsequent rehab.  She developed some wound healing issues at her colostomy site.   She is struggled with vaginal drainage and bleeding since.   She was seen in our clinic by Dr. Andrey Farmer in June 2021.  In the setting of continued vaginal bleeding and drainage, she sought consultation at [facility] with Dr. Johnnette Litter.  On exam, vagina noted to be atrophic.  Blood was seen from the apex of the vaginal vault and on bimanual exam the vault and apex extended posteriorly towards the sacrum without an obvious defect.  Given some concern for possibility of osteomyelitis this driving this drainage, MRI was performed.  MRI on 09/27/2020 showed a 2.3 x 1.9 x 3.3 cm fluid collection that appears contiguous with the rectal pouch via thickened fluid-filled tract and questionable thin fistulous tract anteriorly to the vaginal cuff.  Marked presacral soft tissue thickening and stranding likely reflecting posttreatment changes noted.  No definite evidence of local recurrence.   08/19/21: Excision of 1 cm left upper vulva/groin mass, Examiner anesthesia, vulvar biopsies.  Pathology revealed benign epidermal inclusion cyst.  Vaginal biopsy showed ulcerated squamous mucosa with lymphoplasmacytic infiltrate, no dysplasia or malignancy.   08/25/21: CT of the abdomen and pelvis showed no acute findings.  No evidence of recurrent or metastatic disease.  Stable small benign left adrenal adenoma.    CT of the abdomen and pelvis in April 2024 was negative for acute intra-abdominal or pelvic pathology including no evidence of metastatic disease.  Posttreatment soft tissue changes in the pelvis and presacral region stable.  No drainable fluid collection.  Interval History: Overall doing well.  Had recent surgery on her foot, still in a boot and using a knee scooter for this.  Notes having some lesions on her skin around her stoma right before surgery, similar to when she has had  skin infections previously.  Was treated with doxycycline by her surgeon for postoperative infection, this helped clear up her skin lesions completely.  She was on antibiotics for about a month and by the end of this had almost no vaginal discharge.  Had had some bleeding in the discharge since I saw her last.  No bleeding recently.  She has occasional pain when she uses her abdominal muscles to sit up, otherwise denies any abdominal or pelvic pain.  Also had PCN exchanged to nephroureteral catheters (BUD tubes).   Past Medical/Surgical History: 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(TAH w/ BSO) :  recurrent 11-19-2014 post pelvic surgery and radiation 01-29-2015 to 03-10-2015   Chronic idiopathic neutropenia (HCC)    presumed related to chemotherapy March 2006--- followed by dr Bertis Ruddy (treatment w/ G-CSF injections   Chronic nausea    CKD (chronic kidney disease), stage III [facility])    nephrologist-  dr Bufford Buttner   Difficult intravenous access    small veins--- hx PICC lines   DM type 2 (diabetes mellitus, type 2) (HCC)    endocriologist--  dr Joselyn Glassman Janee Morn   (08-18-2021  per pt checks blood sugar daily in am,  fasting sugar--92--130)   Dysrhythmia    Environmental and seasonal allergies    Generalized muscle weakness    GERD (gastroesophageal reflux disease)    Hiatal hernia    History of abdominal abscess 04/16/2017   post surgery 04-01-2017  --- resolved 10/ 2018    History of COVID-19 05/2021   per pt mild symptoms that resolved   History of gastric polyp    2014  duodenum   History of ileus 04/16/2017   resolved w/ no surgical intervention   History of radiation therapy    01-29-2015 to 03-10-2015  pelvis 50.4Gy   History of small bowel obstruction 01/2019   Hypothyroidism    monitored by dr Casimiro Needle altheimer   Ileostomy in place [facility]) 04/01/2017   created at same time colostomy takedown./  permnant 09/ 2020   Mixed dyslipidemia    Multiple thyroid nodules    Managed by Dr. Gerrit Friends   PAF (paroxysmal atrial fibrillation) [facility])    cardiologist--- dr h. Katrinka Blazing;  event monitor 05/ 2021 in epic, NSR with 1% burden Afib, first degree heart blockl and PACs;  echo 04/ 2021 ef 55-60%, mild LVH   Peripheral neuropathy  PONV (postoperative nausea and vomiting)    "scopolamine patch works for me"   Presence of urostomy (HCC) 04/2019   Radiation-induced dermatitis    contact dermatitis , radiation completed, rash only on ankles now.   Vitamin D deficiency    Vulval lesion    Wears glasses     Past Surgical History:  Procedure Laterality Date   APPENDECTOMY     BREAST RECONSTRUCTION WITH PLACEMENT OF TISSUE EXPANDER AND FLEX HD (ACELLULAR HYDRATED DERMIS) Bilateral 12/11/2015   Procedure: BILATERAL BREAST RECONSTRUCTION WITH PLACEMENT OF TISSUE EXPANDERS;  Surgeon: Glenna Fellows, MD;  Location: [facility] OR;  Service: Plastics;  Laterality: Bilateral;   COLONOSCOPY WITH PROPOFOL N/A 08/21/2013   Procedure: COLONOSCOPY WITH PROPOFOL;  Surgeon: Florencia Reasons, MD;  Location: [facility];  Service: Endoscopy;  Laterality: N/A;   COLOSTOMY TAKEDOWN N/A 12/04/2014   Procedure: LAPROSCOPIC LYSIS OF ADHESIONS, SPLENIC MOBILIZATION, RELOCATION OF COLOSTOMY, DEBRIDEMENT INITIAL COLOSTOMY SITE;  Surgeon: Karie Soda, MD;  Location: [facility];  Service: General;  Laterality: N/A;   CYSTECTOMY W/ URETEROILEAL CONDUIT  04/12/2019   @[facility] ;   INTESTINAL ANASTOMOSIS,  PANNICULECTOMY, PELVIS EXENTENTION,  PARTIAL COLECTOMY REMOVAL ILEIUM WITH PERMNANT ILEOCOLOSTOMY   CYSTOGRAM N/A 06/01/2017   Procedure: CYSTOGRAM;  Surgeon: Sebastian Ache, MD;  Location: [facility];  Service: Urology;  Laterality: N/A;   CYSTOSCOPY W/ RETROGRADES Right 11/21/2015   Procedure: CYSTOSCOPY WITH RETROGRADE PYELOGRAM;  Surgeon: Sebastian Ache, MD;  Location: [facility];  Service: Urology;  Laterality: Right;   CYSTOSCOPY W/ URETERAL STENT PLACEMENT Right 11/21/2015   Procedure: CYSTOSCOPY WITH STENT REPLACEMENT;  Surgeon: Sebastian Ache, MD;  Location: [facility];  Service: Urology;  Laterality: Right;   CYSTOSCOPY W/ URETERAL STENT PLACEMENT Right 03/10/2016   Procedure: CYSTOSCOPY WITH STENT REPLACEMENT;  Surgeon: Sebastian Ache, MD;  Location: [facility];  Service: Urology;  Laterality: Right;   CYSTOSCOPY W/ URETERAL STENT PLACEMENT Right 06/30/2016   Procedure: CYSTOSCOPY WITH RETROGRADE PYELOGRAM/URETERAL STENT EXCHANGE;  Surgeon: Sebastian Ache, MD;  Location: [facility];  Service: Urology;  Laterality: Right;   CYSTOSCOPY W/ URETERAL STENT PLACEMENT N/A 06/01/2017   Procedure: CYSTOSCOPY WITH EXAM UNDER ANESTHESIA;  Surgeon: Sebastian Ache, MD;  Location: [facility];  Service: Urology;  Laterality: N/A;   CYSTOSCOPY W/ URETERAL STENT PLACEMENT Right 08/17/2017   Procedure: CYSTOSCOPY WITH RETROGRADE PYELOGRAM/URETERAL STENT REMOVAL;  Surgeon: Sebastian Ache, MD;  Location: [facility];  Service: Urology;  Laterality: Right;   CYSTOSCOPY WITH RETROGRADE PYELOGRAM, URETEROSCOPY AND STENT PLACEMENT Right 03/20/2015   Procedure: CYSTOSCOPY WITH RETROGRADE PYELOGRAM, URETEROSCOPY WITH BALLOON DILATION AND STENT PLACEMENT ON RIGHT;  Surgeon: Sebastian Ache, MD;  Location: [facility];  Service: Urology;  Laterality: Right;   CYSTOSCOPY WITH RETROGRADE PYELOGRAM, URETEROSCOPY AND STENT PLACEMENT Right 05/02/2015   Procedure:  CYSTOSCOPY WITH RIGHT RETROGRADE PYELOGRAM,  DIAGNOSTIC URETEROSCOPY AND STENT PULL ;  Surgeon: Sebastian Ache, MD;  Location: [facility];  Service: Urology;  Laterality: Right;   CYSTOSCOPY WITH RETROGRADE PYELOGRAM, URETEROSCOPY AND STENT PLACEMENT Right 09/05/2015   Procedure: CYSTOSCOPY WITH RETROGRADE PYELOGRAM,  AND STENT PLACEMENT;  Surgeon: Sebastian Ache, MD;  Location: [facility];  Service: Urology;  Laterality: Right;   CYSTOSCOPY WITH RETROGRADE PYELOGRAM, URETEROSCOPY AND STENT PLACEMENT Right 04/01/2017   Procedure: CYSTOSCOPY WITH RETROGRADE PYELOGRAM, URETEROSCOPY AND STENT PLACEMENT;  Surgeon: Sebastian Ache, MD;  Location: [facility];  Service: Urology;  Laterality: Right;   CYSTOSCOPY WITH STENT PLACEMENT Right 10/27/2016   Procedure:  CYSTOSCOPY WITH STENT CHANGE and right retrograde pyelogram;  Surgeon: Sebastian Ache, MD;  Location: [facility];  Service: Urology;  Laterality: Right;   EUS N/A 10/02/2014   Procedure: LOWER ENDOSCOPIC ULTRASOUND (EUS);  Surgeon: Willis Modena, MD;  Location: [facility];  Service: Endoscopy;  Laterality: N/A;   EXCISION SOFT TISSUE MASS RIGHT FOREMAN  12/08/2006   EYE SURGERY  as child   pytosis of eyelids repair   FOOT SURGERY  10/2023   Left   INCISION AND DRAINAGE OF WOUND Bilateral 12/26/2015   Procedure: DEBRIDEMENT OF BILATERAL MASTECTOMY FLAPS;  Surgeon: Glenna Fellows, MD;  Location: [facility];  Service: Plastics;  Laterality: Bilateral;   IR CV LINE INJECTION  05/31/2017   IR FLUORO GUIDE CV LINE LEFT  05/31/2017   IR FLUORO GUIDE CV LINE RIGHT  04/06/2017   IR FLUORO GUIDE CV MIDLINE PICC RIGHT  05/30/2017   IR NEPHROSTOGRAM LEFT INITIAL PLACEMENT  09/02/2017   IR NEPHROSTOGRAM LEFT THRU EXISTING ACCESS  11/29/2017   IR NEPHROSTOGRAM RIGHT INITIAL PLACEMENT  09/02/2017   IR NEPHROSTOGRAM RIGHT THRU EXISTING ACCESS  09/13/2017   IR NEPHROSTOGRAM RIGHT THRU EXISTING ACCESS  11/29/2017    IR NEPHROSTOMY EXCHANGE LEFT  11/28/2017   IR NEPHROSTOMY EXCHANGE LEFT  01/05/2018   IR NEPHROSTOMY EXCHANGE LEFT  02/16/2018   IR NEPHROSTOMY EXCHANGE LEFT  03/30/2018   IR NEPHROSTOMY EXCHANGE LEFT  05/12/2018   IR NEPHROSTOMY EXCHANGE LEFT  06/21/2018   IR NEPHROSTOMY EXCHANGE LEFT  08/04/2018   IR NEPHROSTOMY EXCHANGE LEFT  09/18/2018   IR NEPHROSTOMY EXCHANGE LEFT  10/09/2018   IR NEPHROSTOMY EXCHANGE LEFT  10/27/2018   IR NEPHROSTOMY EXCHANGE LEFT  11/21/2018   IR NEPHROSTOMY EXCHANGE LEFT  01/05/2019   IR NEPHROSTOMY EXCHANGE LEFT  02/15/2019   IR NEPHROSTOMY EXCHANGE LEFT  03/29/2019   IR NEPHROSTOMY EXCHANGE RIGHT  10/02/2017   IR NEPHROSTOMY EXCHANGE RIGHT  11/28/2017   IR NEPHROSTOMY EXCHANGE RIGHT  01/05/2018   IR NEPHROSTOMY EXCHANGE RIGHT  02/16/2018   IR NEPHROSTOMY EXCHANGE RIGHT  03/30/2018   IR NEPHROSTOMY EXCHANGE RIGHT  05/12/2018   IR NEPHROSTOMY EXCHANGE RIGHT  06/21/2018   IR NEPHROSTOMY EXCHANGE RIGHT  08/04/2018   IR NEPHROSTOMY EXCHANGE RIGHT  09/18/2018   IR NEPHROSTOMY EXCHANGE RIGHT  10/27/2018   IR NEPHROSTOMY EXCHANGE RIGHT  11/21/2018   IR NEPHROSTOMY EXCHANGE RIGHT  01/05/2019   IR NEPHROSTOMY EXCHANGE RIGHT  02/15/2019   IR NEPHROSTOMY EXCHANGE RIGHT  03/29/2019   IR NEPHROSTOMY PLACEMENT LEFT  10/02/2017   IR RADIOLOGIST EVAL & MGMT  05/03/2017   IR US GUIDE VASC ACCESS LEFT  05/31/2017   IR US GUIDE VASC ACCESS RIGHT  04/06/2017   IR US GUIDE VASC ACCESS RIGHT  05/30/2017   LAPAROSCOPIC CHOLECYSTECTOMY  1990   LIPOSUCTION WITH LIPOFILLING Bilateral 04/16/2016   Procedure: LIPOSUCTION WITH LIPOFILLING TO BILATERAL CHEST;  Surgeon: Glenna Fellows, MD;  Location: [facility];  Service: Plastics;  Laterality: Bilateral;   MASTECTOMY W/ SENTINEL NODE BIOPSY Bilateral 12/11/2015   Procedure: RIGHT PROPHYLACTIC MASTECTOMY, LEFT TOTAL MASTECTOMY WITH LEFT AXILLARY SENTINEL LYMPH NODE BIOPSY;  Surgeon: Almond Lint, MD;  Location: [facility];  Service: General;  Laterality: Bilateral;   OSTOMY N/A 11/19/2014   Procedure: OSTOMY;  Surgeon: Karie Soda, MD;  Location: [facility];  Service: General;  Laterality: N/A;   PROCTOSCOPY N/A 04/01/2017   Procedure: RIDGE PROCTOSCOPY;  Surgeon: Karie Soda, MD;  Location: [facility];  Service: General;  Laterality: N/A;   REMOVAL OF BILATERAL TISSUE EXPANDERS WITH PLACEMENT OF BILATERAL BREAST IMPLANTS Bilateral 04/16/2016   Procedure: REMOVAL OF BILATERAL TISSUE EXPANDERS WITH PLACEMENT OF BILATERAL BREAST IMPLANTS;  Surgeon: Glenna Fellows, MD;  Location: [facility];  Service: Plastics;  Laterality: Bilateral;   ROBOTIC ASSISTED LAP VAGINAL HYSTERECTOMY N/A 11/19/2014   Procedure: ROBOTIC LYSIS OF ADHESIONS, CONVERTED TO LAPAROTOMY RADICAL UPPER VAGINECTOMY,LOW ANTERIOR BOWEL RESECTION, COLOSTOMY, BILATERAL URETERAL STENT PLACEMENT AND CYSTONOMY CLOSURE;  Surgeon: Adolphus Birchwood, MD;  Location: [facility];  Service: Gynecology;  Laterality: N/A;   TISSUE EXPANDER FILLING Bilateral 12/26/2015   Procedure: EXPANSION OF BILATERAL CHEST TISSUE EXPANDERS (60 mL- Right; 75 mL- Left);  Surgeon: Glenna Fellows, MD;  Location: [facility];  Service: Plastics;  Laterality: Bilateral;   TONSILLECTOMY     TOTAL ABDOMINAL HYSTERECTOMY  March 2006   [facility]   and Bilateral Salpingoophorectomy/  staging for Ovarian cancer/  an   VULVAR LESION REMOVAL N/A 08/19/2021   Procedure: VULVAR LESION with biopsies;  Surgeon: Carver Fila, MD;  Location: [facility];  Service: Gynecology;  Laterality: N/A;   XI ROBOTIC ASSISTED LOWER ANTERIOR RESECTION N/A 04/01/2017   Procedure: XI ROBOTIC VS LAPAROSCOPIC COLOSTOMY TAKEDOWN WITH LYSIS OF ADHESIONS.;  Surgeon: Karie Soda, MD;  Location: [facility];  Service: General;  Laterality: N/A;  ERAS PATHWAY    Family History  Problem Relation Age of Onset   Cancer Mother 64       stomach ca   Hypertension Mother    Cancer  Father 70       prostate ca   Diabetes Father    Heart disease Father        CABG   Hypertension Father    Hyperlipidemia Father    Obesity Father    Breast cancer Maternal Aunt        dx in her 60s   Lymphoma Paternal Aunt    Brain cancer Paternal Grandfather    Ovarian cancer Other    Diabetes Sister    Hypertension Brother y-10   Heart disease Brother        CABG   Diabetes Brother     Social History   Socioeconomic History   Marital status: Married    Spouse name: Clinical research associate   Number of children: 1   Years of education: Not on file   Highest education level: Master's degree (e.g., MA, MS, MEng, MEd, MSW, MBA)  Occupational History   Occupation: retired Charity fundraiser from [employer]    Comment: L&D Charity fundraiser - retired  Tobacco Use   Smoking status: Never    Passive exposure: Never   Smokeless tobacco: Never  Vaping Use   Vaping status: Never Used  Substance and Sexual Activity   Alcohol use: Yes    Comment: Occ   Drug use: No   Sexual activity: Not Currently  Other Topics Concern   Not on file  Social History Narrative   Exercise-- has not gotten back into it since cancer came back   Social Drivers of Health   Financial Resource Strain: Low Risk  (08/17/2023)   Overall Financial Resource Strain (CARDIA)    Difficulty of Paying Living Expenses: Not very hard  Food Insecurity: No Food Insecurity (08/17/2023)   Hunger Vital Sign    Worried About Running Out of Food in the Last Year: Never true    Ran Out of Food in the Last Year: Never true  Transportation Needs: No Transportation Needs (08/17/2023)   PRAPARE - Administrator, Civil Service (Medical): No    Lack of Transportation (Non-Medical): No  Physical Activity: Insufficiently Active (08/17/2023)   Exercise Vital Sign    Days of Exercise per Week: 2 days    Minutes of Exercise per Session: 20 min  Stress: No Stress Concern Present (08/17/2023)   [employer] of Occupational Health - Occupational Stress  Questionnaire    Feeling of Stress : Not at all  Social Connections: Socially Integrated (08/17/2023)   Social Connection and Isolation Panel [NHANES]    Frequency of Communication with Friends and Family: More than three times a week    Frequency of Social Gatherings with Friends and Family: Three times a week    Attends Religious Services: More than 4 times per year    Active Member of Clubs or Organizations: Yes    Attends Banker Meetings: More than 4 times per year    Marital Status: Married    Current Medications:  Current Outpatient Medications:    aspirin EC 81 MG tablet, Take 1 tablet (81 mg total) by mouth daily. Swallow whole., Disp: 90 tablet, Rfl: 3   Biotin 5 MG TABS, Take 5 mg by mouth daily. , Disp: , Rfl:    buPROPion (WELLBUTRIN) 100 MG tablet, Take 1 tablet (100 mg total) by mouth 2 (two) times daily., Disp: 180 tablet, Rfl: 0   calcium citrate-vitamin D (CITRACAL+D) 315-200 MG-UNIT tablet, Take 1 tablet by mouth daily., Disp: , Rfl:    Cholecalciferol (VITAMIN D3) 10000 UNITS capsule, Take 10,000 Units by mouth See admin instructions. Take 1 tablet by mouth 43times per week., Disp: , Rfl:    clobetasol (OLUX) 0.05 % topical foam, Apply topically 2 (two) times daily., Disp: 50 g, Rfl: 3   Continuous Blood Gluc Sensor (FREESTYLE LIBRE 3 SENSOR) MISC, Inject 1 sensor to the skin every 14 days for continuous glucose monitoring., Disp: , Rfl:    Cranberry-Vitamin C 15000-100 MG CAPS, Take by mouth daily. 1 in Morning and 1 in PM, Disp: , Rfl:    doxycycline (VIBRA-TABS) 100 MG tablet, Take 1 tablet (100 mg total) by mouth 2 (two) times daily., Disp: 20 tablet, Rfl: 0   famotidine-calcium carbonate-magnesium hydroxide (PEPCID COMPLETE) 10-800-165 MG chewable tablet, Chew 1 tablet by mouth as needed., Disp: , Rfl:    ferrous sulfate 325 (65 FE) MG tablet, Take 1 tablet (325 mg total) by mouth at bedtime., Disp: 30 tablet, Rfl: 3   filgrastim-aafi (NIVESTYM) 480  MCG/0.8ML SOSY injection, Inject 0.8 mLs (480 mcg total) into the skin every 7 (seven) days., Disp: 3.2 mL, Rfl: 11   fluticasone (FLONASE) 50 MCG/ACT nasal spray, Place 2 sprays into both nostrils daily., Disp: 16 g, Rfl: 0   HYDROcodone-acetaminophen (NORCO/VICODIN) 5-325 MG tablet, Take 1 tablet by mouth every 6 (six) hours as needed for moderate pain (pain score 4-6)., Disp: 30 tablet, Rfl: 0   HYDROcodone-acetaminophen (NORCO/VICODIN) 5-325 MG tablet, Take 1 tablet by mouth every 6 (six) hours as needed for moderate pain (pain score 4-6)., Disp: 30 tablet, Rfl: 0   ibuprofen (ADVIL) 800 MG tablet, Take 1 tablet (800 mg total) by mouth every 6 (six) hours as needed., Disp: 60 tablet, Rfl: 1   levothyroxine (SYNTHROID, LEVOTHROID) 150 MCG tablet, Take 1 tablet (150 mcg total) by mouth daily before breakfast., Disp: 30 tablet, Rfl: 1   loratadine (CLARITIN) 10 MG tablet, Take 10 mg by  mouth daily. , Disp: , Rfl:    MOUNJARO 5 MG/0.5ML Pen, Inject 5 mg into the skin once a week., Disp: 6 mL, Rfl: 0   moxifloxacin (VIGAMOX) 0.5 % ophthalmic solution, Place 1 drop into both eyes 3 (three) times daily., Disp: 3 mL, Rfl: 0   omega-3 acid ethyl esters (LOVAZA) 1 G capsule, Take 1 g by mouth 2 (two) times daily. , Disp: , Rfl:    Polyethyl Glycol-Propyl Glycol (SYSTANE OP), Place 1 drop into both eyes daily as needed (dry eyes). , Disp: , Rfl:    pregabalin (LYRICA) 50 MG capsule, Take 1 capsule by mouth twice daily., Disp: 60 capsule, Rfl: 4   Prenatal Vit-Fe Fumarate-FA (PRENATAL VITAMIN PO), Take 1 capsule by mouth daily. Takes prenatal because there are no dyes in it, Disp: , Rfl:    Probiotic Product (ALIGN PO), Take 1 capsule by mouth daily., Disp: , Rfl:    rosuvastatin (CRESTOR) 10 MG tablet, Take 10 mg by mouth every evening., Disp: , Rfl:    Saccharomyces boulardii (FLORASTOR PO), Take 1 capsule by mouth at bedtime., Disp: , Rfl:    Tazarotene (ARAZLO) 0.045 % LOTN, Apply 1 application topically  daily., Disp: 45 g, Rfl: 0   tiZANidine (ZANAFLEX) 4 MG tablet, Take 0.5-1 tablets (2-4 mg total) by mouth every 8 (eight) hours as needed for muscle spasms., Disp: 180 tablet, Rfl: 3   XIFAXAN 550 MG TABS tablet, Take 550 mg by mouth as needed., Disp: , Rfl:   Review of Systems: + Ringing in ears, vaginal discharge/bleeding, problems with walking, bruising easily Denies appetite changes, fevers, chills, fatigue, unexplained weight changes. Denies hearing loss, neck lumps or masses, mouth sores or voice changes. Denies cough or wheezing.  Denies shortness of breath. Denies chest pain or palpitations. Denies leg swelling. Denies abdominal distention, pain, blood in stools, constipation, diarrhea, nausea, vomiting, or early satiety. Denies pain with intercourse, dysuria, frequency, hematuria or incontinence. Denies hot flashes, pelvic pain.   Denies joint pain, back pain or muscle pain/cramps. Denies itching, rash, or wounds. Denies dizziness, headaches, numbness or seizures. Denies swollen lymph nodes or glands. Denies anxiety, depression, confusion, or decreased concentration.  Physical Exam: BP 132/64 Comment: manual recehck  Pulse 73   Temp 97.6 F (36.4 C) (Oral)   Resp 19   SpO2 99%  General: Alert, oriented, no acute distress. HEENT: Normocephalic, atraumatic, sclera anicteric. Chest: Clear to auscultation bilaterally.  No wheezes or rhonchi. Cardiovascular: Regular rate and rhythm, no murmurs. Abdomen: Obese, soft, nontender.  Normoactive bowel sounds.  Ostomy bag in each lower quadrant, urine in the urostomy bag.  I cannot see skin immediately around to the ostomies secondary to the bag but skin surrounding ostomy bags without lesions.   Extremities: Grossly normal range of motion.  Warm, well perfused.  No edema bilaterally. Lymphatics: No cervical, supraclavicular, or inguinal adenopathy. GU: Normal appearing external female genitalia.  On speculum exam with a small  speculum, vaginal mucosa is normal in appearance.  There is a ring of constricted tissue where the vagina narrows approximately 4 cm above the introitus, vaginal mucosa is healthy appearing.  Minimal discharge noted.  On bimanual exam, no masses or nodularity appreciated.  Narrowing of the vagina is palpated with cavity above this narrowing. Exam findings confirmed on rectal.   Laboratory & Radiologic Studies: None new  Assessment & Plan: [name] is a 68 y.o. woman with a history of recurrent endometrioid endometrial/ovarian cancer.   S/p exploratory  laparotomy, posterior supralevator pelvic exenteration, end colostomy, bladder repair, ureteral stenting with complete resection and negative margins on 11/09/14. S/p adjuvant radiation completed 03/10/15. Disease free since this time. Last imaging 11/2022 -no acute findings, no evidence of recurrent or metastatic disease.   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   Status post pelvic exenteration procedure at [facility] in September 2020 with proctectomy and cystectomy and formation of permanent colostomy and ileal conduit.  Vaginectomy not performed.   Persistent vaginal drainage, sometimes resolves completely around the time of antibiotics.   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 No specific follow-up or surveillance recommended by genetics counselor.  Since her last visit with me, developed bilateral hydronephrosis requiring PCN placement with ultimate exchange to BUD tubes.    Plan: 1) Right lower quadrant pain and intermittent pSBO symptoms: Continues to have occasional right lower quadrant pain when she changes positions.  Does not endorse any partial SBO symptoms since her last visit with me.    2) History of  recurrent endometrial/ovarian cancer: s/p complete resection with negative margins and s/p adjuvant radiation. No chemotherapy (in the recurrent setting) due to chronic idiopathic neutropenia. She has a history of primary adjuvant chemotherapy after her initial diagnosis of stage III endometrial cancer in 2006. CT imaging and extensive pelvic surgical evaluation in 2020 demonstrated complete clinical response. Imaging in early 2022 with no definitive evidence of recurrent disease. CT imaging most recently in 2024 negative for recurrent disease.   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 She has persistent vaginal drainage despite cystectomy and total proctectomy. She patient continues to have vaginal and rectal bleeding intermittently as well as vaginal discharge. She's been previously counseled that in the setting of vagina having been left in situ at the time of her exenteration in 04/2019, surgery to performed total vaginectomy and placement of myocutaneous flap would require another major surgery that would likely involve having her colostomy and ileal conduit having to be taken down and would be associated with significant morbidity.  Additionally there would be no guarantee that her tissues, severely damaged by radiation, would heal such as a second surgery and result in good outcomes. After the patient stopped her Eliquis, this significantly helped decrease her bleeding. Hyperbaric oxygen seem to help some also.  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  Had previously discussed with interventional radiology with recommendation against consideration of embolization or drain placement.  Findings continue to be stable on imaging, last CT scan in April ([facility]) and October ([facility]) 2024. Bleeding/discharge seems to respond to  antibiotics when she is on them for treatment of skin/soft tissue infections.   4) Chronic neutropenia - managed by Dr. Leafy Half. Uses gCSF weekly.    5) Breast cancer - history of stage IIA. ER/PR positive. Treated with surgery and arimidex.     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 Patient follows-up with Dr Matthias Hughs for close colon cancer and upper GI cancer surveillance   From a cancer standpoint, I will continue to see her every 6 months.  25 minutes of total time was spent for this patient encounter, including preparation, face-to-face counseling with the patient and coordination of care, and documentation of the encounter.  Eugene Garnet, MD  Division of Gynecologic Oncology  Department of Obstetrics and Gynecology  [facility]

## 2023-11-04 NOTE — Patient Instructions (Signed)
 It was good to see you today.  I do not see or feel any evidence of cancer recurrence on your exam.  I will see you for follow-up in 6 months.  As always, if you develop any new and concerning symptoms before your next visit, please call to see me sooner.

## 2023-11-08 ENCOUNTER — Encounter (INDEPENDENT_AMBULATORY_CARE_PROVIDER_SITE_OTHER): Payer: Self-pay | Admitting: Family Medicine

## 2023-11-08 ENCOUNTER — Ambulatory Visit (INDEPENDENT_AMBULATORY_CARE_PROVIDER_SITE_OTHER): Admitting: Family Medicine

## 2023-11-08 VITALS — BP 114/69 | HR 63 | Temp 97.6°F | Ht 61.0 in | Wt 194.0 lb

## 2023-11-08 DIAGNOSIS — E1169 Type 2 diabetes mellitus with other specified complication: Secondary | ICD-10-CM

## 2023-11-08 DIAGNOSIS — E669 Obesity, unspecified: Secondary | ICD-10-CM | POA: Diagnosis not present

## 2023-11-08 DIAGNOSIS — E119 Type 2 diabetes mellitus without complications: Secondary | ICD-10-CM

## 2023-11-08 DIAGNOSIS — J3089 Other allergic rhinitis: Secondary | ICD-10-CM

## 2023-11-08 DIAGNOSIS — F3289 Other specified depressive episodes: Secondary | ICD-10-CM | POA: Diagnosis not present

## 2023-11-08 DIAGNOSIS — Z7985 Long-term (current) use of injectable non-insulin antidiabetic drugs: Secondary | ICD-10-CM

## 2023-11-08 NOTE — Progress Notes (Signed)
 Office: [phone]  /  Fax: [phone]  WEIGHT SUMMARY AND BIOMETRICS  Anthropometric Measurements Height: 5\' 1"  (1.549 m) Weight: 194 lb (88 kg) BMI (Calculated): 36.67 Weight at Last Visit: 196 lb Weight Lost Since Last Visit: 2 lb Weight Gained Since Last Visit: 0 Starting Weight: 208 lb Total Weight Loss (lbs): 14 lb (6.35 kg) Peak Weight: 300 lb   No data recorded Other Clinical Data Fasting: no Labs: no Today's Visit #: 48 Starting Date: 04/05/18 Comments: no strip    Chief Complaint: OBESITY    History of Present Illness [name] is a 68 year old female with obesity and type 2 diabetes who presents with concerns about postoperative recovery and blood sugar management.  She is experiencing postoperative issues following foot surgery, including redness and burning in her foot. She was prescribed antibiotics to address these symptoms. She is eager to have her boot removed but is uncertain about the timeline. A pin in her toe is causing discomfort as it protrudes through her bandages. Her activity is limited, and she uses a knee scooter for mobility.  She is on Mounjaro 5 mg for type 2 diabetes and reports episodes of hypoglycemia, with blood sugar levels dropping to the fifties, particularly at night. She is not on any other medication for blood sugar control. She is trying to manage these episodes by having a protein snack before bed, such as pistachio nuts, which have helped maintain her blood sugar levels overnight. No lightheadedness or nausea with Mounjaro use.  Her diet is similar to keto, focusing on low sugar intake and consuming foods like saba noodles and brown rice. She uses an app for dietary guidance but finds it lacks flexibility. She has been consuming banana smoothies with chia seeds for breakfast and is trying to maintain her protein intake to aid healing.  Her current medications include Mounjaro, bupropion, and Flonase. She is awaiting  a refill of Mounjaro and has recently resolved an issue with her insurance for her diabetes medication. She reports no issues with Mounjaro aside from the low blood sugar episodes. Her eyes are watering, indicating a need to use Flonase.      PHYSICAL EXAM:  Blood pressure 114/69, pulse 63, temperature 97.6 F (36.4 C), height 5\' 1"  (1.549 m), weight 194 lb (88 kg), SpO2 97%. Body mass index is 36.66 kg/m.  DIAGNOSTIC DATA REVIEWED:  BMET    Component Value Date/Time   NA 138 08/22/2023 1015   NA 141 01/11/2022 1544   NA 141 01/24/2017 1228   K 4.3 08/22/2023 1015   K 4.6 01/24/2017 1228   CL 102 08/22/2023 1015   CO2 24 08/22/2023 1015   CO2 29 01/24/2017 1228   GLUCOSE 132 (H) 08/22/2023 1015   GLUCOSE 84 01/24/2017 1228   BUN 31 (H) 08/22/2023 1015   BUN 29 (H) 01/11/2022 1544   BUN 22.2 01/24/2017 1228   CREATININE 1.73 (H) 08/22/2023 1015   CREATININE 2.20 (H) 06/04/2022 1450   CREATININE 1.91 (H) 01/18/2020 1440   CREATININE 0.8 01/24/2017 1228   CALCIUM 9.5 08/22/2023 1015   CALCIUM 10.2 01/24/2017 1228   GFRNONAA 24 (L) 06/04/2022 1450   GFRNONAA 78 12/16/2014 1530   GFRAA 38 (L) 06/16/2020 0834   GFRAA 30 (L) 03/27/2018 1324   GFRAA >89 12/16/2014 1530   Lab Results  Component Value Date   HGBA1C 6.4 08/22/2023   HGBA1C 6.3 (H) 11/07/2014   Lab Results  Component Value Date   INSULIN  14.1 06/16/2020   Lab Results  Component Value Date   TSH 1.23 08/22/2023   CBC    Component Value Date/Time   WBC 9.7 10/06/2023 1112   RBC 4.22 10/06/2023 1112   HGB 12.5 10/06/2023 1112   HGB 11.3 (L) 06/04/2022 1450   HGB 11.5 01/11/2022 1544   HGB 12.4 07/29/2017 1444   HCT 40.1 10/06/2023 1112   HCT 35.5 01/11/2022 1544   HCT 38.0 07/29/2017 1444   PLT 257 10/06/2023 1112   PLT 184 06/04/2022 1450   PLT 207 01/11/2022 1544   MCV 95.0 10/06/2023 1112   MCV 94 01/11/2022 1544   MCV 90.9 07/29/2017 1444   MCH 29.6 10/06/2023 1112   MCHC 31.2 10/06/2023  1112   RDW 14.6 10/06/2023 1112   RDW 13.1 01/11/2022 1544   RDW 15.5 (H) 07/29/2017 1444   Iron Studies    Component Value Date/Time   IRON 7 (L) 08/31/2017 0451   IRON 12 (L) 11/29/2014 1251   TIBC 164 (L) 08/31/2017 0451   TIBC 331 11/29/2014 1251   FERRITIN 27 11/29/2014 1251   IRONPCTSAT 4 (L) 08/31/2017 0451   IRONPCTSAT 4 (L) 11/29/2014 1251   Lipid Panel     Component Value Date/Time   CHOL 77 02/17/2023 0948   CHOL 92 (L) 06/16/2020 0834   TRIG 118.0 02/17/2023 0948   HDL 39.20 02/17/2023 0948   HDL 49 06/16/2020 0834   CHOLHDL 2 02/17/2023 0948   VLDL 23.6 02/17/2023 0948   LDLCALC 14 02/17/2023 0948   LDLCALC 21 06/16/2020 0834   LDLCALC 13 01/18/2020 1440   Hepatic Function Panel     Component Value Date/Time   PROT 7.5 08/22/2023 1015   PROT 7.8 06/16/2020 0834   PROT 6.9 01/24/2017 1228   ALBUMIN 3.9 08/22/2023 1015   ALBUMIN 4.4 06/16/2020 0834   ALBUMIN 3.4 (L) 01/24/2017 1228   AST 18 08/22/2023 1015   AST 12 (L) 06/04/2022 1450   AST 16 01/24/2017 1228   ALT 24 08/22/2023 1015   ALT 14 06/04/2022 1450   ALT 14 01/24/2017 1228   ALKPHOS 159 (H) 08/22/2023 1015   ALKPHOS 90 01/24/2017 1228   BILITOT 0.4 08/22/2023 1015   BILITOT 0.6 06/04/2022 1450   BILITOT 0.34 01/24/2017 1228      Component Value Date/Time   TSH 1.23 08/22/2023 1015   Nutritional Lab Results  Component Value Date   VD25OH 44.37 08/22/2023   VD25OH 36.40 02/17/2023   VD25OH 31.09 02/15/2022     Assessment and Plan Assessment & Plan Type 2 diabetes Experiencing nocturnal hypoglycemia with blood sugar levels in the 50s, likely due to dietary factors rather than Mounjaro. Not on other diabetes medications. Suggested protein snack before bed to stabilize blood sugar. - Consume a protein snack before bed, such as half a Premier Protein shake or Greek yogurt - Monitor blood sugar levels and adjust snack size if necessary - Continue Mounjaro an eating  plan  Obesity Following a structured diet plan with weight loss from 196 lbs to 191 lbs. Finding the diet restrictive and considering the Lose It app for flexibility. Using [facility] for weight management with no adverse effects reported. - Consider transitioning to the Lose It app for dietary management - 1300 kcal and 80+ gm protein   Allergic rhinitis Reports watery eyes and inconsistent use of prescribed Flonase. Advised to start medication to alleviate symptoms. - Start using Flonase as prescribed - Send in a refill for [employer]  Medication management Currently taking Mounjaro, bupropion, and Flonase. No issues with Mounjaro aside from hypoglycemia, addressed through dietary adjustments. Awaiting delayed Mounjaro shipment and resolved insurance issues affecting medication supply. - Ensure timely refills of Mounjaro, bupropion, and Flonase - Monitor for any side effects or issues with medication supply  Follow-up No follow-up appointment scheduled. Suggested timing next visit with medication refills and post-tube exchange. - Schedule follow-up appointment approximately six weeks after Dec 15, 2023, to coincide with medication refill needs    She was informed of the importance of frequent follow up visits to maximize her success with intensive lifestyle modifications for her multiple health conditions.    Quillian Quince, MD

## 2023-11-09 ENCOUNTER — Ambulatory Visit (INDEPENDENT_AMBULATORY_CARE_PROVIDER_SITE_OTHER): Payer: BC Managed Care – PPO | Admitting: Podiatry

## 2023-11-09 DIAGNOSIS — M7752 Other enthesopathy of left foot: Secondary | ICD-10-CM

## 2023-11-09 DIAGNOSIS — M2042 Other hammer toe(s) (acquired), left foot: Secondary | ICD-10-CM

## 2023-11-09 DIAGNOSIS — M24575 Contracture, left foot: Secondary | ICD-10-CM

## 2023-11-09 DIAGNOSIS — Z9889 Other specified postprocedural states: Secondary | ICD-10-CM

## 2023-11-09 MED ORDER — CICLOPIROX 8 % EX SOLN: Freq: Every day | CUTANEOUS | 0 refills | Status: DC

## 2023-11-09 NOTE — Progress Notes (Signed)
 Subjective:  Patient ID: [name], female    DOB: 01/28/1956,  MRN: [id number]  Chief Complaint  Patient presents with   Routine Post Op    DOS 10/17/23 --- LEFT 1ST MPJ FUSION WITH LEFT 2ND DIGIT HAMMERTOE ARTHROPLASTY WITH 2ND MPJ CAPSULOTOMY WITH FXATION    DOS: 10/17/2023 Procedure: Left first metatarsophalangeal joint fusion with left second digit arthroplasty with capsulotomy of the second metatarsophalangeal joint  68 y.o. female returns for post-op check.  The patient states that she is doing well denies any other acute complaints her pain is controlled.  Bandages clean dry and intact  Review of Systems: Negative except as noted in the HPI. Denies N/V/F/Ch.  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   oncologist-- dr rossi/ dr gorsuch:/   dx endometroid endometrial & ovarian cancer s/p  chemotheapy and surgery(TAH w/ BSO) :  recurrent 11-19-2014 post pelvic surgery and radiation 01-29-2015 to 03-10-2015   Chronic idiopathic neutropenia (HCC)    presumed related to chemotherapy March 2006--- followed by dr Marton Sleeper (treatment w/ G-CSF injections   Chronic nausea    CKD (chronic kidney disease), stage III [facility])    nephrologist-  dr Leandra Pro   Difficult intravenous access    small veins--- hx PICC lines   DM type 2 (diabetes mellitus, type 2) (HCC)    endocriologist--  dr Herminia Lope Hildy Lowers   (08-18-2021  per pt checks blood sugar daily in am,  fasting sugar--92--130)   Dysrhythmia    Environmental and seasonal allergies    Generalized muscle weakness    GERD  (gastroesophageal reflux disease)    Hiatal hernia    History of abdominal abscess 04/16/2017   post surgery 04-01-2017  --- resolved 10/ 2018   History of COVID-19 05/2021   per pt mild symptoms that resolved   History of gastric polyp    2014  duodenum   History of ileus 04/16/2017   resolved w/ no surgical intervention   History of radiation therapy    01-29-2015 to 03-10-2015  pelvis 50.4Gy   History of small bowel obstruction 01/2019   Hypothyroidism    monitored by dr Bambi Lever altheimer   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 Sofia Dunn   PAF (paroxysmal atrial fibrillation) [facility])    cardiologist--- dr h. 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   Vitamin D deficiency    Vulval lesion    Wears glasses     Current Outpatient Medications:    ciclopirox (PENLAC) 8 % solution, Apply topically at bedtime. Apply over nail and surrounding skin.  Apply daily over previous coat. After seven (7) days, may remove with alcohol and continue cycle., Disp: 6.6 mL, Rfl: 0   aspirin EC 81 MG tablet, Take 1 tablet (81 mg total) by mouth daily. Swallow whole., Disp: 90 tablet, Rfl: 3   Biotin 5 MG TABS, Take 5 mg by mouth daily. , Disp: , Rfl:    buPROPion (WELLBUTRIN) 100 MG tablet, Take 1 tablet (100 mg total) by mouth 2 (two) times daily., Disp: 180 tablet, Rfl: 0   calcium citrate-vitamin D (CITRACAL+D) 315-200 MG-UNIT tablet, Take 1 tablet by mouth daily., Disp: , Rfl:    Cholecalciferol (VITAMIN D3) 10000 UNITS capsule, Take 10,000  Units by mouth See admin instructions. Take 1 tablet by mouth 43times per week., Disp: , Rfl:    clobetasol (OLUX) 0.05 % topical foam, Apply topically 2 (two) times daily., Disp: 50 g, Rfl: 3   Continuous Blood Gluc Sensor (FREESTYLE LIBRE 3 SENSOR) MISC, Inject 1 sensor to the skin every 14 days for continuous glucose monitoring., Disp: , Rfl:    Cranberry-Vitamin C 15000-100 MG CAPS, Take by mouth daily. 1 in Morning and 1 in PM, Disp: , Rfl:    famotidine-calcium carbonate-magnesium hydroxide (PEPCID COMPLETE) 10-800-165 MG chewable tablet, Chew 1 tablet by mouth as needed., Disp: , Rfl:    ferrous sulfate 325 (65 FE) MG tablet, Take 1 tablet (325 mg total) by mouth at bedtime., Disp: 30 tablet, Rfl: 3   filgrastim-aafi (NIVESTYM) 480 MCG/0.8ML SOSY injection, Inject 0.8 mLs (480 mcg total) into the skin every 7 (seven) days., Disp: 3.2 mL, Rfl: 11   fluticasone (FLONASE) 50 MCG/ACT nasal spray, Place 2 sprays into both nostrils daily., Disp: 16 g, Rfl: 0   HYDROcodone bit-homatropine (HYDROMET) 5-1.5 MG/5ML syrup, TAKE 5 ML BY MOUTH EVERY 6 HOURS AS NEEDED FOR COUGH, Disp: , Rfl:    ibuprofen (ADVIL) 800 MG tablet, Take 1 tablet (800 mg total) by mouth every 6 (six) hours as needed., Disp: 60 tablet, Rfl: 1   levothyroxine (SYNTHROID, LEVOTHROID) 150 MCG tablet, Take 1 tablet (150 mcg total) by mouth daily before breakfast., Disp: 30 tablet, Rfl: 1   loratadine (CLARITIN) 10 MG tablet, Take 10 mg by mouth daily. , Disp: , Rfl:    MOUNJARO 5 MG/0.5ML Pen, Inject 5 mg into the skin once a week., Disp: 6 mL, Rfl: 0   moxifloxacin (VIGAMOX) 0.5 % ophthalmic solution, Place 1 drop into both eyes 3 (three) times daily., Disp: 3 mL, Rfl: 0   omega-3 acid ethyl esters (LOVAZA) 1 G capsule, Take 1 g by mouth 2 (two) times daily. , Disp: , Rfl:    Polyethyl Glycol-Propyl Glycol (SYSTANE OP), Place 1 drop into both eyes daily as needed (dry eyes). , Disp: , Rfl:    pregabalin (LYRICA) 50 MG capsule, Take 1  capsule by mouth twice daily., Disp: 60 capsule, Rfl: 4   Prenatal Vit-Fe Fumarate-FA (PRENATAL VITAMIN PO), Take 1 capsule by mouth daily. Takes prenatal because there are no dyes in it, Disp: , Rfl:    Probiotic Product (ALIGN PO), Take 1 capsule by mouth daily., Disp: , Rfl:    rosuvastatin (CRESTOR) 10 MG tablet, Take 10 mg by mouth every evening., Disp: , Rfl:    Saccharomyces boulardii (FLORASTOR PO), Take 1 capsule by mouth at bedtime., Disp: , Rfl:    Tazarotene (ARAZLO) 0.045 % LOTN, Apply 1 application topically daily., Disp: 45 g, Rfl: 0   tiZANidine (ZANAFLEX) 4 MG tablet, Take 0.5-1  tablets (2-4 mg total) by mouth every 8 (eight) hours as needed for muscle spasms., Disp: 180 tablet, Rfl: 3   UNABLE TO FIND, 4 mg every day by oral route., Disp: , Rfl:    XIFAXAN 550 MG TABS tablet, Take 550 mg by mouth as needed., Disp: , Rfl:   Social History   Tobacco Use  Smoking Status Never   Passive exposure: Never  Smokeless Tobacco Never    Allergies  Allergen Reactions   Penicillins Hives and Swelling    Facial swelling/childhood allergy Has patient had a PCN reaction causing immediate rash, facial/tongue/throat swelling, SOB or lightheadedness with hypotension: Yes Has patient had a PCN reaction causing severe rash involving mucus membranes or skin necrosis: Yes Has patient had a PCN reaction that required hospitalization yes Has patient had a PCN reaction occurring within the last 10 years: No If all of the above answers are "NO", then may proceed with Cephalosporin use.    Erythromycin Other (See Comments)    Gastritis, abd cramps   Tape Rash    blisters   Trimethoprim Rash   Ultram [Tramadol] Hives   Zarxio [Filgrastim] Other (See Comments)    Post injection, elevated heart rate, body feeling unwell, uneasy.    Cephalosporins Rash   Fluconazole Rash   Neomycin Rash    blisters   Oxycodone Other (See Comments)    " I just feel weird"   Pectin Rash    Pectin ring for  stoma   Septra [Sulfamethoxazole-Trimethoprim] Rash   Sulfa Antibiotics Rash   Objective:  There were no vitals filed for this visit. There is no height or weight on file to calculate BMI. Constitutional Well developed. Well nourished.  Vascular Foot warm and well perfused. Capillary refill normal to all digits.   Neurologic Normal speech. Oriented to person, place, and time. Epicritic sensation to light touch grossly present bilaterally.  Dermatologic Skin complete epithelialized.  No signs of dehiscence noted no complication noted.  Orthopedic: Tenderness to palpation noted about the surgical site.   Radiographs: 3 views of skeletally mature adult left foot: Hardware is intact no signs of backing or loosening noted.  Good correction alignment noted. Assessment:   1. Capsulitis of metatarsophalangeal (MTP) joint of left foot   2. Hammertoe of second toe of left foot   3. Joint contracture of foot, left   4. Status post foot surgery     Plan:  Patient was evaluated and treated and all questions answered.  S/p foot surgery left -Progressing as expected post-operatively. -XR: See above -WB Status: Slowly begin weightbearing as tolerated in left lower extremity with the boot -Sutures: Removed   No clinical signs of Deis and no no complication noted. -Medications: None -Foot redressed.  No follow-ups on file.

## 2023-11-15 ENCOUNTER — Encounter: Payer: Self-pay | Admitting: Pulmonary Disease

## 2023-11-15 ENCOUNTER — Ambulatory Visit: Payer: BC Managed Care – PPO | Admitting: Student

## 2023-11-15 ENCOUNTER — Ambulatory Visit: Attending: Pulmonary Disease | Admitting: Pulmonary Disease

## 2023-11-15 VITALS — BP 110/68 | HR 66 | Ht 61.0 in

## 2023-11-15 DIAGNOSIS — I440 Atrioventricular block, first degree: Secondary | ICD-10-CM

## 2023-11-15 DIAGNOSIS — I480 Paroxysmal atrial fibrillation: Secondary | ICD-10-CM | POA: Diagnosis not present

## 2023-11-15 DIAGNOSIS — I10 Essential (primary) hypertension: Secondary | ICD-10-CM | POA: Diagnosis not present

## 2023-11-15 NOTE — Progress Notes (Signed)
  Electrophysiology Office Note:   Date:  11/15/2023  ID:  [name], DOB [birth year], MRN [id number]  Primary Cardiologist: None Primary Heart Failure: None Electrophysiologist: Boyce Byes, MD      History of Present Illness:   [name] is a 68 y.o. female with h/o 1st Degree AVB, AF, DM II, breast cancer on L, chronic anemia  seen today for routine electrophysiology followup.   Since last being seen in our clinic the patient reports she has done well over the last year.  She has had no recurrence of atrial fibrillation or palpitations.  She wears her Apple Watch and has had no indication of anything other than normal sinus rhythm.  She recently had a foot surgery and is in a walking boot.  She denies chest pain, palpitations, dyspnea, PND, orthopnea, nausea, vomiting, dizziness, syncope, edema, weight gain, or early satiety.   Review of systems complete and found to be negative unless listed in HPI.   EP Information / Studies Reviewed:    EKG is ordered today. Personal review as below.  EKG Interpretation Date/Time:  Tuesday November 15 2023 14:52:47 EDT Ventricular Rate:  66 PR Interval:  216 QRS Duration:  82 QT Interval:  416 QTC Calculation: 436 R Axis:   71  Text Interpretation: Sinus rhythm with 1st degree A-V block Confirmed by Creighton Doffing ([id number]) on 11/15/2023 3:01:00 PM   Studies:  ECHO 11/2019 > LVEF 55-60%  Cardiac Monitor 12/2019 > NSR predominant, PAF accounting for <1% of heart rhythm, 56 minutes during 1 month monitor, 1AVB & PAC's noted  Arrhythmia / AAD Paroxysmal AF     Risk Assessment/Calculations:    CHA2DS2-VASc Score = 4   This indicates a 4.8% annual risk of stroke. The patient's score is based upon: CHF History: 0 HTN History: 1 Diabetes History: 1 Stroke History: 0 Vascular Disease History: 0 Age Score: 1 Gender Score: 1             Physical Exam:   VS:  BP 110/68   Pulse 66   Ht 5\' 1"  (1.549 m)   SpO2 98%    BMI 36.66 kg/m    Wt Readings from Last 3 Encounters:  11/08/23 194 lb (88 kg)  10/10/23 196 lb (88.9 kg)  10/06/23 198 lb 12.8 oz (90.2 kg)     GEN: Well nourished, well developed in no acute distress NECK: No JVD; No carotid bruits CARDIAC: Regular rate and rhythm, no murmurs, rubs, gallops RESPIRATORY:  Clear to auscultation without rales, wheezing or rhonchi  ABDOMEN: Soft, non-tender, non-distended EXTREMITIES:  No edema; No deformity   ASSESSMENT AND PLAN:    Paroxysmal Atrial Fibrillation  CHA2DS2-VASc 4 -EKG with NSR   -continue ASA 81mg  daily  -Monitors with an Apple Watch, no burden in the last year -currently not on any medications, continue to monitor -reviewed risk factors for AF and risk modification   Follow up with Dr. Marven Slimmer in 12 months  Signed, Creighton Doffing, NP-C, AGACNP-BC [facility] - Electrophysiology  11/15/2023, 5:07 PM

## 2023-11-15 NOTE — Patient Instructions (Addendum)
 Medication Instructions:  Your physician recommends that you continue on your current medications as directed. Please refer to the Current Medication list given to you today.  *If you need a refill on your cardiac medications before your next appointment, please call your pharmacy*  Lab Work: None ordered If you have labs (blood work) drawn today and your tests are completely normal, you will receive your results only by: MyChart Message (if you have MyChart) OR A paper copy in the mail If you have any lab test that is abnormal or we need to change your treatment, we will call you to review the results.  Follow-Up: At [facility], you and your health needs are our priority.  As part of our continuing mission to provide you with exceptional heart care, our providers are all part of one team.  This team includes your primary Cardiologist (physician) and Advanced Practice Providers or APPs (Physician Assistants and Nurse Practitioners) who all work together to provide you with the care you need, when you need it.  Your next appointment:   Next available  Provider:   Dr Rolm Clos  1 yr with Dr Marven Slimmer     1st Floor: - Lobby - Registration  - Pharmacy  - Lab - Cafe  2nd Floor: - PV Lab - Diagnostic Testing (echo, CT, nuclear med)  3rd Floor: - Vacant  4th Floor: - TCTS (cardiothoracic surgery) - AFib Clinic - Structural Heart Clinic - Vascular Surgery  - Vascular Ultrasound  5th Floor: - HeartCare Cardiology (general and EP) - Clinical Pharmacy for coumadin, hypertension, lipid, weight-loss medications, and med management appointments    Valet parking services will be available as well.

## 2023-11-21 DIAGNOSIS — D044 Carcinoma in situ of skin of scalp and neck: Secondary | ICD-10-CM | POA: Diagnosis not present

## 2023-11-21 DIAGNOSIS — L6611 Classic lichen planopilaris: Secondary | ICD-10-CM | POA: Diagnosis not present

## 2023-11-27 ENCOUNTER — Encounter: Payer: Self-pay | Admitting: Podiatry

## 2023-11-27 MED ORDER — DOXYCYCLINE HYCLATE 100 MG PO TABS: 100.0000 mg | ORAL_TABLET | Freq: Two times a day (BID) | ORAL | 0 refills | Status: DC

## 2023-12-05 ENCOUNTER — Telehealth: Payer: Self-pay | Admitting: *Deleted

## 2023-12-05 NOTE — Telephone Encounter (Signed)
 LMOM for the patient to call the office back. Per provider need to move 10/10 appt to another Friday in October

## 2023-12-06 NOTE — Telephone Encounter (Signed)
 [name] returned call from [place]. She is scheduled for 10/24 @ 2:30. Pt agrees to date/time

## 2023-12-07 ENCOUNTER — Ambulatory Visit (INDEPENDENT_AMBULATORY_CARE_PROVIDER_SITE_OTHER)

## 2023-12-07 ENCOUNTER — Ambulatory Visit (INDEPENDENT_AMBULATORY_CARE_PROVIDER_SITE_OTHER): Admitting: Podiatry

## 2023-12-07 DIAGNOSIS — M7752 Other enthesopathy of left foot: Secondary | ICD-10-CM

## 2023-12-07 DIAGNOSIS — M2042 Other hammer toe(s) (acquired), left foot: Secondary | ICD-10-CM

## 2023-12-07 NOTE — Progress Notes (Signed)
 Subjective:  Patient ID: [name], female    DOB: [birth year],  MRN: [id number]  Chief Complaint  Patient presents with   Routine Post Op    DOS 10/17/23 --- LEFT 1ST MPJ FUSION WITH LEFT 2ND DIGIT HAMMERTOE ARTHROPLASTY WITH 2ND MPJ CAPSULOTOMY WITH FXATION    DOS: 10/17/2023 Procedure: Left first metatarsophalangeal joint fusion with left second digit arthroplasty with capsulotomy of the second metatarsophalangeal joint  68 y.o. female returns for post-op check.  The patient states that she is doing well denies any other acute complaints her pain is controlled.  Bandages clean dry and intact  Review of Systems: Negative except as noted in the HPI. Denies N/V/F/Ch.  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 and surgery(TAH w/ BSO) :  recurrent 11-19-2014 post pelvic surgery and radiation 01-29-2015 to 03-10-2015   Chronic idiopathic neutropenia (HCC)    presumed related to chemotherapy March 2006--- followed by dr Marton Sleeper (treatment w/ G-CSF injections   Chronic nausea    CKD (chronic kidney disease), stage III [facility])    nephrologist-  dr Leandra Pro   Difficult intravenous access    small veins--- hx PICC lines   DM type 2 (diabetes mellitus, type 2) (HCC)    endocriologist--  dr Herminia Lope Hildy Lowers   (08-18-2021  per pt checks blood sugar daily in am,  fasting sugar--92--130)   Dysrhythmia    Environmental and seasonal allergies    Generalized muscle weakness    GERD  (gastroesophageal reflux disease)    Hiatal hernia    History of abdominal abscess 04/16/2017   post surgery 04-01-2017  --- resolved 10/ 2018   History of COVID-19 05/2021   per pt mild symptoms that resolved   History of gastric polyp    2014  duodenum   History of ileus 04/16/2017   resolved w/ no surgical intervention   History of radiation therapy    01-29-2015 to 03-10-2015  pelvis 50.4Gy   History of small bowel obstruction 01/2019   Hypothyroidism    monitored by dr Bambi Lever altheimer   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 Sofia Dunn   PAF (paroxysmal atrial fibrillation) [facility])    cardiologist--- dr h. 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   Vitamin D  deficiency    Vulval lesion    Wears glasses     Current Outpatient Medications:    aspirin  EC 81 MG tablet, Take 1 tablet (81 mg total) by mouth daily. Swallow  whole., Disp: 90 tablet, Rfl: 3   Biotin  5 MG TABS, Take 5 mg by mouth daily. , Disp: , Rfl:    buPROPion  (WELLBUTRIN ) 100 MG tablet, Take 1 tablet (100 mg total) by mouth 2 (two) times daily., Disp: 180 tablet, Rfl: 0   calcium  citrate-vitamin D  (CITRACAL+D) 315-200 MG-UNIT tablet, Take 1 tablet by mouth daily., Disp: , Rfl:    Cholecalciferol  (VITAMIN D3) 10000 UNITS capsule, Take 10,000 Units by mouth See admin instructions. Take 1 tablet by mouth 43times per week., Disp: , Rfl:    ciclopirox  (PENLAC ) 8 % solution, Apply topically at bedtime. Apply over nail and surrounding skin. Apply daily over previous  coat. After seven (7) days, may remove with alcohol  and continue cycle., Disp: 6.6 mL, Rfl: 0   clobetasol  (OLUX ) 0.05 % topical foam, Apply topically 2 (two) times daily., Disp: 50 g, Rfl: 3   Continuous Blood Gluc Sensor (FREESTYLE LIBRE 3 SENSOR) MISC, Inject 1 sensor to the skin every 14 days for continuous glucose monitoring., Disp: , Rfl:    Cranberry-Vitamin C  15000-100 MG CAPS, Take by mouth daily. 1 in Morning and 1 in PM, Disp: , Rfl:    doxycycline  (VIBRA -TABS) 100 MG tablet, Take 1 tablet (100 mg total) by mouth 2 (two) times daily., Disp: 20 tablet, Rfl: 0   famotidine -calcium  carbonate-magnesium  hydroxide (PEPCID  COMPLETE) 10-800-165 MG chewable tablet, Chew 1 tablet by mouth as needed., Disp: , Rfl:    ferrous sulfate  325 (65 FE) MG tablet, Take 1 tablet (325 mg total) by mouth at bedtime., Disp: 30 tablet, Rfl: 3   filgrastim -aafi (NIVESTYM ) 480 MCG/0.8ML SOSY injection, Inject 0.8 mLs (480 mcg total) into the skin every 7 (seven) days., Disp: 3.2 mL, Rfl: 11   fluticasone  (FLONASE ) 50 MCG/ACT nasal spray, Place 2 sprays into both nostrils daily., Disp: 16 g, Rfl: 0   HYDROcodone  bit-homatropine (HYDROMET) 5-1.5 MG/5ML syrup, TAKE 5 ML BY MOUTH EVERY 6 HOURS AS NEEDED FOR COUGH, Disp: , Rfl:    ibuprofen  (ADVIL ) 800 MG tablet, Take 1 tablet (800 mg total) by mouth every 6 (six) hours as needed., Disp: 60 tablet, Rfl: 1   levothyroxine  (SYNTHROID , LEVOTHROID) 150 MCG tablet, Take 1 tablet (150 mcg total) by mouth daily before breakfast., Disp: 30 tablet, Rfl: 1   loratadine  (CLARITIN ) 10 MG tablet, Take 10 mg by mouth daily. , Disp: , Rfl:    MOUNJARO  5 MG/0.5ML Pen, Inject 5 mg into the skin once a week., Disp: 6 mL, Rfl: 0   moxifloxacin  (VIGAMOX ) 0.5 % ophthalmic solution, Place 1 drop into both eyes 3 (three) times daily., Disp: 3 mL, Rfl: 0   omega-3 acid ethyl esters (LOVAZA ) 1 G capsule, Take 1 g by mouth 2 (two) times daily. , Disp: , Rfl:    Polyethyl Glycol-Propyl Glycol  (SYSTANE OP), Place 1 drop into both eyes daily as needed (dry eyes). , Disp: , Rfl:    pregabalin  (LYRICA ) 50 MG capsule, Take 1 capsule by mouth twice daily., Disp: 60 capsule, Rfl: 4   Prenatal Vit-Fe Fumarate-FA (PRENATAL VITAMIN PO), Take 1 capsule by mouth daily. Takes prenatal because there are no dyes in it, Disp: , Rfl:    Probiotic Product (ALIGN PO), Take 1 capsule by mouth daily., Disp: , Rfl:    rosuvastatin  (CRESTOR ) 10 MG tablet, Take 10 mg by mouth every evening., Disp: , Rfl:    Saccharomyces boulardii (FLORASTOR PO), Take 1 capsule by mouth at bedtime., Disp: , Rfl:  Tazarotene  (ARAZLO ) 0.045 % LOTN, Apply 1 application topically daily., Disp: 45 g, Rfl: 0   tiZANidine  (ZANAFLEX ) 4 MG tablet, Take 0.5-1 tablets (2-4 mg total) by mouth every 8 (eight) hours as needed for muscle spasms., Disp: 180 tablet, Rfl: 3   UNABLE TO FIND, 4 mg every day by oral route., Disp: , Rfl:    XIFAXAN 550 MG TABS tablet, Take 550 mg by mouth as needed., Disp: , Rfl:   Social History   Tobacco Use  Smoking Status Never   Passive exposure: Never  Smokeless Tobacco Never    Allergies  Allergen Reactions   Penicillins Hives and Swelling    Facial swelling/childhood allergy Has patient had a PCN reaction causing immediate rash, facial/tongue/throat swelling, SOB or lightheadedness with hypotension: Yes Has patient had a PCN reaction causing severe rash involving mucus membranes or skin necrosis: Yes Has patient had a PCN reaction that required hospitalization yes Has patient had a PCN reaction occurring within the last 10 years: No If all of the above answers are "NO", then may proceed with Cephalosporin use.    Erythromycin Other (See Comments)    Gastritis, abd cramps   Tape Rash    blisters   Trimethoprim Rash   Ultram  [Tramadol ] Hives   Zarxio  [Filgrastim ] Other (See Comments)    Post injection, elevated heart rate, body feeling unwell, uneasy.    Cephalosporins Rash    Fluconazole  Rash   Neomycin  Rash    blisters   Oxycodone  Other (See Comments)    " I just feel weird"   Pectin Rash    Pectin ring for stoma   Septra [Sulfamethoxazole-Trimethoprim] Rash   Sulfa Antibiotics Rash   Objective:  There were no vitals filed for this visit. There is no height or weight on file to calculate BMI. Constitutional Well developed. Well nourished.  Vascular Foot warm and well perfused. Capillary refill normal to all digits.   Neurologic Normal speech. Oriented to person, place, and time. Epicritic sensation to light touch grossly present bilaterally.  Dermatologic Skin complete epithelialized.  No signs of dehiscence noted no complication noted.  Good correction alignment noted  Orthopedic: No further tenderness to palpation noted about the surgical site.   Radiographs: 3 views of skeletally mature adult left foot: Hardware is intact no signs of backing or loosening noted.  Good correction alignment noted. Assessment:   1. Capsulitis of metatarsophalangeal (MTP) joint of left foot     Plan:  Patient was evaluated and treated and all questions answered.  S/p foot surgery left - Likely healed and officially discharged from the care of any foot and ankle issues on future she will come back and see me.  At this time I discussed shoe gear modification if any foot and ankle issues are acute arises she will come see No follow-ups on file.

## 2023-12-09 ENCOUNTER — Encounter (INDEPENDENT_AMBULATORY_CARE_PROVIDER_SITE_OTHER): Payer: Self-pay | Admitting: Family Medicine

## 2023-12-12 ENCOUNTER — Other Ambulatory Visit (INDEPENDENT_AMBULATORY_CARE_PROVIDER_SITE_OTHER): Payer: Self-pay | Admitting: Family Medicine

## 2023-12-12 DIAGNOSIS — J3089 Other allergic rhinitis: Secondary | ICD-10-CM

## 2023-12-12 MED ORDER — FLUTICASONE PROPIONATE 50 MCG/ACT NA SUSP
2.0000 | Freq: Every day | NASAL | 3 refills | Status: DC
Start: 2023-12-12 — End: 2023-12-12

## 2023-12-12 MED ORDER — FLUTICASONE PROPIONATE 50 MCG/ACT NA SUSP: 2.0000 | Freq: Every day | NASAL | 3 refills | Status: DC

## 2023-12-15 DIAGNOSIS — N131 Hydronephrosis with ureteral stricture, not elsewhere classified: Secondary | ICD-10-CM | POA: Diagnosis not present

## 2023-12-20 ENCOUNTER — Ambulatory Visit (INDEPENDENT_AMBULATORY_CARE_PROVIDER_SITE_OTHER): Admitting: Family Medicine

## 2023-12-20 ENCOUNTER — Encounter (INDEPENDENT_AMBULATORY_CARE_PROVIDER_SITE_OTHER): Payer: Self-pay | Admitting: Family Medicine

## 2023-12-20 VITALS — BP 121/70 | HR 67 | Temp 97.8°F | Ht 61.0 in | Wt 189.0 lb

## 2023-12-20 DIAGNOSIS — E119 Type 2 diabetes mellitus without complications: Secondary | ICD-10-CM | POA: Diagnosis not present

## 2023-12-20 DIAGNOSIS — E1169 Type 2 diabetes mellitus with other specified complication: Secondary | ICD-10-CM

## 2023-12-20 DIAGNOSIS — Z7985 Long-term (current) use of injectable non-insulin antidiabetic drugs: Secondary | ICD-10-CM

## 2023-12-20 DIAGNOSIS — J3089 Other allergic rhinitis: Secondary | ICD-10-CM

## 2023-12-20 DIAGNOSIS — E669 Obesity, unspecified: Secondary | ICD-10-CM

## 2023-12-20 DIAGNOSIS — F5089 Other specified eating disorder: Secondary | ICD-10-CM | POA: Diagnosis not present

## 2023-12-20 DIAGNOSIS — F3289 Other specified depressive episodes: Secondary | ICD-10-CM

## 2023-12-20 DIAGNOSIS — Z6835 Body mass index (BMI) 35.0-35.9, adult: Secondary | ICD-10-CM

## 2023-12-20 MED ORDER — BUPROPION HCL 100 MG PO TABS: 100.0000 mg | ORAL_TABLET | Freq: Two times a day (BID) | ORAL | 0 refills | Status: DC

## 2023-12-20 MED ORDER — MOUNJARO 5 MG/0.5ML ~~LOC~~ SOAJ: 5.0000 mg | SUBCUTANEOUS | 0 refills | Status: DC

## 2023-12-20 MED ORDER — FLUTICASONE PROPIONATE 50 MCG/ACT NA SUSP: 2.0000 | Freq: Every day | NASAL | 3 refills | Status: DC

## 2023-12-20 NOTE — Progress Notes (Signed)
 Office: [phone]  /  Fax: [phone]  WEIGHT SUMMARY AND BIOMETRICS  Anthropometric Measurements Height: 5\' 1"  (1.549 m) Weight: 189 lb (85.7 kg) BMI (Calculated): 35.73 Weight at Last Visit: 194 lb Weight Lost Since Last Visit: 5 lb Weight Gained Since Last Visit: 0 Starting Weight: 208 lb Total Weight Loss (lbs): 19 lb (8.618 kg) Peak Weight: 300 lb   Body Composition  Body Fat %: 48.8 % Fat Mass (lbs): 92.2 lbs Muscle Mass (lbs): 91.8 lbs Total Body Water  (lbs): 74 lbs Visceral Fat Rating : 15   Other Clinical Data Fasting: no Labs: no Today's Visit #: 37 Starting Date: 04/05/18    Chief Complaint: OBESITY   History of Present Illness [name] is a 68 year old female with obesity and type 2 diabetes who presents for obesity treatment and progress assessment.  She is adhering to a journaling plan with a daily intake of 1300 calories and 70 to 80 grams of protein, achieving this goal approximately 60% of the time. Over the past five weeks, she has lost five pounds. Her recent foot surgery has resulted in swelling and discomfort, hindering her ability to walk and preventing her from exercising.  She has a history of type 2 diabetes and is currently on Mounjaro  5 mg weekly, for which she requests a refill.  She has a history of emotional eating behaviors and is being treated with Wellbutrin  100 mg twice daily.  She also has allergic rhinitis, which limits her ability to exercise outdoors. She is using Flonase  and requests a refill.  She experiences difficulty maintaining her protein intake, often reaching 65 to 75 grams per day instead of the recommended 80 grams. She faces challenges with dietary choices, especially when dining out, and has a sensitivity to salty foods.      PHYSICAL EXAM:  Blood pressure 121/70, pulse 67, temperature 97.8 F (36.6 C), height 5\' 1"  (1.549 m), weight 189 lb (85.7 kg), SpO2 99%. Body mass index is 35.71  kg/m.  DIAGNOSTIC DATA REVIEWED:  BMET    Component Value Date/Time   NA 138 08/22/2023 1015   NA 141 01/11/2022 1544   NA 141 01/24/2017 1228   K 4.3 08/22/2023 1015   K 4.6 01/24/2017 1228   CL 102 08/22/2023 1015   CO2 24 08/22/2023 1015   CO2 29 01/24/2017 1228   GLUCOSE 132 (H) 08/22/2023 1015   GLUCOSE 84 01/24/2017 1228   BUN 31 (H) 08/22/2023 1015   BUN 29 (H) 01/11/2022 1544   BUN 22.2 01/24/2017 1228   CREATININE 1.73 (H) 08/22/2023 1015   CREATININE 2.20 (H) 06/04/2022 1450   CREATININE 1.91 (H) 01/18/2020 1440   CREATININE 0.8 01/24/2017 1228   CALCIUM  9.5 08/22/2023 1015   CALCIUM  10.2 01/24/2017 1228   GFRNONAA 24 (L) 06/04/2022 1450   GFRNONAA 78 12/16/2014 1530   GFRAA 38 (L) 06/16/2020 0834   GFRAA 30 (L) 03/27/2018 1324   GFRAA >89 12/16/2014 1530   Lab Results  Component Value Date   HGBA1C 6.4 08/22/2023   HGBA1C 6.3 (H) 11/07/2014   Lab Results  Component Value Date   INSULIN  14.1 06/16/2020   Lab Results  Component Value Date   TSH 1.23 08/22/2023   CBC    Component Value Date/Time   WBC 9.7 10/06/2023 1112   RBC 4.22 10/06/2023 1112   HGB 12.5 10/06/2023 1112   HGB 11.3 (L) 06/04/2022 1450   HGB 11.5 01/11/2022 1544   HGB 12.4 07/29/2017  1444   HCT 40.1 10/06/2023 1112   HCT 35.5 01/11/2022 1544   HCT 38.0 07/29/2017 1444   PLT 257 10/06/2023 1112   PLT 184 06/04/2022 1450   PLT 207 01/11/2022 1544   MCV 95.0 10/06/2023 1112   MCV 94 01/11/2022 1544   MCV 90.9 07/29/2017 1444   MCH 29.6 10/06/2023 1112   MCHC 31.2 10/06/2023 1112   RDW 14.6 10/06/2023 1112   RDW 13.1 01/11/2022 1544   RDW 15.5 (H) 07/29/2017 1444   Iron  Studies    Component Value Date/Time   IRON  7 (L) 08/31/2017 0451   IRON  12 (L) 11/29/2014 1251   TIBC 164 (L) 08/31/2017 0451   TIBC 331 11/29/2014 1251   FERRITIN 27 11/29/2014 1251   IRONPCTSAT 4 (L) 08/31/2017 0451   IRONPCTSAT 4 (L) 11/29/2014 1251   Lipid Panel     Component Value Date/Time    CHOL 77 02/17/2023 0948   CHOL 92 (L) 06/16/2020 0834   TRIG 118.0 02/17/2023 0948   HDL 39.20 02/17/2023 0948   HDL 49 06/16/2020 0834   CHOLHDL 2 02/17/2023 0948   VLDL 23.6 02/17/2023 0948   LDLCALC 14 02/17/2023 0948   LDLCALC 21 06/16/2020 0834   LDLCALC 13 01/18/2020 1440   Hepatic Function Panel     Component Value Date/Time   PROT 7.5 08/22/2023 1015   PROT 7.8 06/16/2020 0834   PROT 6.9 01/24/2017 1228   ALBUMIN  3.9 08/22/2023 1015   ALBUMIN  4.4 06/16/2020 0834   ALBUMIN  3.4 (L) 01/24/2017 1228   AST 18 08/22/2023 1015   AST 12 (L) 06/04/2022 1450   AST 16 01/24/2017 1228   ALT 24 08/22/2023 1015   ALT 14 06/04/2022 1450   ALT 14 01/24/2017 1228   ALKPHOS 159 (H) 08/22/2023 1015   ALKPHOS 90 01/24/2017 1228   BILITOT 0.4 08/22/2023 1015   BILITOT 0.6 06/04/2022 1450   BILITOT 0.34 01/24/2017 1228      Component Value Date/Time   TSH 1.23 08/22/2023 1015   Nutritional Lab Results  Component Value Date   VD25OH 44.37 08/22/2023   VD25OH 36.40 02/17/2023   VD25OH 31.09 02/15/2022     Assessment and Plan Assessment & Plan Obesity Obesity management with a calorie-restricted diet of 1300 calories and 70-80 grams of protein per day. Adherence to the diet plan is approximately 60%. Recent foot surgery limits exercise ability. Weight loss of 5 pounds over the last 5 weeks. Emphasized protein intake for metabolism and muscle mass maintenance. Encouraged core strengthening exercises to aid in weight management without impacting foot recovery. - Continue diet plan with 1300 calories and at least 80 grams of protein per day. - Encourage core strengthening exercises using a ball or wobble cushion. - Consider chair yoga and use of small weights for additional exercise. - Discuss use of moleskin for shoe comfort post-surgery.  Type 2 Diabetes Mellitus Type 2 Diabetes Mellitus managed with Mounjaro  5 mg weekly. Blood sugars are stable. - Refill Mounjaro  5 mg weekly  prescription.  Emotional Eating Behaviors Emotional eating behaviors managed with Wellbutrin  100 mg twice daily. Condition is well-managed. - Refill Wellbutrin  100 mg twice daily prescription.  Allergic Rhinitis Allergic rhinitis affects ability to exercise outdoors. Managed with Flonase . - Refill Flonase  prescription.  Follow-up Plans to travel to [place]  and [facility] with anticipated walking activities. Discussed importance of appropriate footwear and avoiding overexertion. - Schedule follow-up appointment in six weeks after return from trip.     She was  informed of the importance of frequent follow up visits to maximize her success with intensive lifestyle modifications for her multiple health conditions.    Jasmine Mesi, MD

## 2023-12-23 DIAGNOSIS — I129 Hypertensive chronic kidney disease with stage 1 through stage 4 chronic kidney disease, or unspecified chronic kidney disease: Secondary | ICD-10-CM | POA: Diagnosis not present

## 2023-12-23 DIAGNOSIS — E1169 Type 2 diabetes mellitus with other specified complication: Secondary | ICD-10-CM | POA: Diagnosis not present

## 2023-12-23 DIAGNOSIS — N184 Chronic kidney disease, stage 4 (severe): Secondary | ICD-10-CM | POA: Diagnosis not present

## 2023-12-23 DIAGNOSIS — E1122 Type 2 diabetes mellitus with diabetic chronic kidney disease: Secondary | ICD-10-CM | POA: Diagnosis not present

## 2023-12-23 DIAGNOSIS — E039 Hypothyroidism, unspecified: Secondary | ICD-10-CM | POA: Diagnosis not present

## 2023-12-23 DIAGNOSIS — E1165 Type 2 diabetes mellitus with hyperglycemia: Secondary | ICD-10-CM | POA: Diagnosis not present

## 2023-12-27 ENCOUNTER — Encounter: Payer: Self-pay | Admitting: Family Medicine

## 2023-12-27 ENCOUNTER — Ambulatory Visit (INDEPENDENT_AMBULATORY_CARE_PROVIDER_SITE_OTHER): Admitting: Family Medicine

## 2023-12-27 VITALS — BP 118/70 | HR 58 | Temp 97.7°F | Resp 18 | Ht 61.0 in | Wt 195.6 lb

## 2023-12-27 DIAGNOSIS — Z9889 Other specified postprocedural states: Secondary | ICD-10-CM | POA: Diagnosis not present

## 2023-12-27 DIAGNOSIS — G8929 Other chronic pain: Secondary | ICD-10-CM | POA: Diagnosis not present

## 2023-12-27 DIAGNOSIS — Z0000 Encounter for general adult medical examination without abnormal findings: Secondary | ICD-10-CM

## 2023-12-27 MED ORDER — PREGABALIN 50 MG PO CAPS: 50.0000 mg | ORAL_CAPSULE | Freq: Two times a day (BID) | ORAL | 4 refills | Status: DC

## 2023-12-27 NOTE — Progress Notes (Deleted)
 Subjective:    [name] is a 68 y.o. female who presents for a Welcome to Medicare exam.  Discussed the use of AI scribe software for clinical note transcription with the patient, who gave verbal consent to proceed.  History of Present Illness [name] is a 68 year old female who presents for a Medicare wellness visit.  She underwent foot surgery in March or April for bunion and hammer toe correction. Postoperatively, she experienced an infection requiring antibiotics, with a low-grade fever and redness necessitating a second course of antibiotics. The foot remains slightly swollen, especially after prolonged standing, but she is satisfied with the surgical outcome as it has improved her balance.  She has a history of diabetes and recently saw a new provider due to her previous endocrinologist's absence. Her A1c was 5.5. She plans to discontinue biotin  as it was not aiding her hair.  She has stage 4 kidney disease and underwent a procedure to place internal nephrostomy tubes due to blocked ureters. She visits the urologist every eight weeks for tube exchanges and reports some challenges with the tube length during changes.  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 She describes a recent episode of significant bleeding after bending over, followed by clotting and recurrent bleeding. She has not contacted her provider about this recent episode.  She uses Lyrica  and fluticasone , with a recent refill on the latter. She has had issues with insurance coverage since her husband's retirement, affecting her Medicare and [employer].  No memory problems, depression, or hearing issues. She reports some frustration but not depression. She has not been exercising much since her surgery but is considering resuming activity.          Objective:     Today's Vitals   12/27/23 1333  BP: 118/70  Pulse: (!) 58  Resp: 18  Temp: 97.7 F  (36.5 C)  TempSrc: Oral  SpO2: 96%  Weight: 195 lb 9.6 oz (88.7 kg)  Height: 5\' 1"  (1.549 m)  Body mass index is 36.96 kg/m.  Medications Outpatient Encounter Medications as of 12/27/2023  Medication Sig   aspirin  EC 81 MG tablet Take 1 tablet (81 mg total) by mouth daily. Swallow whole.   Biotin  5 MG TABS Take 5 mg by mouth daily.    buPROPion  (WELLBUTRIN ) 100 MG tablet Take 1 tablet (100 mg total) by mouth 2 (two) times daily.   calcium  citrate-vitamin D  (CITRACAL+D) 315-200 MG-UNIT tablet Take 1 tablet by mouth daily.   Cholecalciferol  (VITAMIN D3) 10000 UNITS capsule Take 10,000 Units by mouth See admin instructions. Take 1 tablet by mouth 43times per week.   ciclopirox  (PENLAC ) 8 % solution Apply topically at bedtime. Apply over nail and surrounding skin. Apply daily over previous coat. After seven (7) days, may remove with alcohol  and continue cycle.   clobetasol  (OLUX ) 0.05 % topical foam Apply topically 2 (two) times daily.   Continuous Blood Gluc Sensor (FREESTYLE LIBRE 3 SENSOR) MISC Inject 1 sensor to the skin every 14 days for continuous glucose monitoring.   Cranberry-Vitamin C  15000-100 MG CAPS Take by mouth daily. 1 in Morning and 1 in PM   doxycycline  (VIBRA -TABS) 100 MG tablet Take 1 tablet (100 mg total) by mouth 2 (two) times daily.   famotidine -calcium  carbonate-magnesium  hydroxide (PEPCID  COMPLETE) 10-800-165 MG chewable tablet Chew 1 tablet by mouth as needed.   ferrous sulfate  325 (65 FE) MG tablet Take 1 tablet (325 mg total) by mouth  at bedtime.   filgrastim -aafi (NIVESTYM ) 480 MCG/0.8ML SOSY injection Inject 0.8 mLs (480 mcg total) into the skin every 7 (seven) days.   fluticasone  (FLONASE ) 50 MCG/ACT nasal spray Place 2 sprays into both nostrils daily.   HYDROcodone  bit-homatropine (HYDROMET) 5-1.5 MG/5ML syrup TAKE 5 ML BY MOUTH EVERY 6 HOURS AS NEEDED FOR COUGH   ibuprofen  (ADVIL ) 800 MG tablet Take 1 tablet (800 mg total) by mouth every 6 (six) hours as needed.    levothyroxine  (SYNTHROID , LEVOTHROID) 150 MCG tablet Take 1 tablet (150 mcg total) by mouth daily before breakfast.   loratadine  (CLARITIN ) 10 MG tablet Take 10 mg by mouth daily.    MOUNJARO  5 MG/0.5ML Pen Inject 5 mg into the skin once a week.   moxifloxacin  (VIGAMOX ) 0.5 % ophthalmic solution Place 1 drop into both eyes 3 (three) times daily.   omega-3 acid ethyl esters (LOVAZA ) 1 G capsule Take 1 g by mouth 2 (two) times daily.    Polyethyl Glycol-Propyl Glycol (SYSTANE OP) Place 1 drop into both eyes daily as needed (dry eyes).    Prenatal Vit-Fe Fumarate-FA (PRENATAL VITAMIN PO) Take 1 capsule by mouth daily. Takes prenatal because there are no dyes in it   Probiotic Product (ALIGN PO) Take 1 capsule by mouth daily.   rosuvastatin  (CRESTOR ) 10 MG tablet Take 10 mg by mouth every evening.   Saccharomyces boulardii (FLORASTOR PO) Take 1 capsule by mouth at bedtime.   Tazarotene  (ARAZLO ) 0.045 % LOTN Apply 1 application topically daily.   tiZANidine  (ZANAFLEX ) 4 MG tablet Take 0.5-1 tablets (2-4 mg total) by mouth every 8 (eight) hours as needed for muscle spasms.   UNABLE TO FIND 4 mg every day by oral route.   XIFAXAN 550 MG TABS tablet Take 550 mg by mouth as needed.   [DISCONTINUED] pregabalin  (LYRICA ) 50 MG capsule Take 1 capsule by mouth twice daily.   pregabalin  (LYRICA ) 50 MG capsule Take 1 capsule (50 mg total) by mouth 2 (two) times daily.   No facility-administered encounter medications on file as of 12/27/2023.     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    oncologist-- dr rossi/ dr gorsuch:/   dx endometroid endometrial & ovarian cancer s/p  chemotheapy and surgery(TAH w/ BSO) :  recurrent 11-19-2014 post pelvic surgery and radiation 01-29-2015 to 03-10-2015   Chronic idiopathic neutropenia (HCC)    presumed related to chemotherapy March 2006--- followed by dr Marton Sleeper (treatment w/ G-CSF injections   Chronic nausea    CKD (chronic kidney disease), stage III [facility])    nephrologist-  dr Leandra Pro   Difficult intravenous access    small veins--- hx PICC lines   DM type 2 (diabetes mellitus, type 2) (HCC)    endocriologist--  dr Herminia Lope Hildy Lowers   (08-18-2021  per pt checks blood sugar daily in am,  fasting sugar--92--130)   Dysrhythmia    Environmental and seasonal allergies    Generalized muscle weakness    GERD (gastroesophageal reflux disease)    Hiatal hernia    History of abdominal abscess 04/16/2017   post  surgery 04-01-2017  --- resolved 10/ 2018   History of COVID-19 05/2021   per pt mild symptoms that resolved   History of gastric polyp    2014  duodenum   History of ileus 04/16/2017   resolved w/ no surgical intervention   History of radiation therapy    01-29-2015 to 03-10-2015  pelvis 50.4Gy   History of small bowel obstruction 01/2019   Hypothyroidism    monitored by dr Bambi Lever altheimer   Ileostomy in place [facility]) 04/01/2017   created at same time colostomy takedown./  permnant 09/ 2020   Mixed dyslipidemia    Multiple thyroid  nodules    Managed by Dr. Sofia Dunn   PAF (paroxysmal atrial fibrillation) [facility])    cardiologist--- dr h. 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   Vitamin D   deficiency    Vulval lesion    Wears glasses    Past Surgical History:  Procedure Laterality Date   APPENDECTOMY     BREAST RECONSTRUCTION WITH PLACEMENT OF TISSUE EXPANDER AND FLEX HD (ACELLULAR HYDRATED DERMIS) Bilateral 12/11/2015   Procedure: BILATERAL BREAST RECONSTRUCTION WITH PLACEMENT OF TISSUE EXPANDERS;  Surgeon: Alger Infield, MD;  [facility]: [facility];  Service: Plastics;  Laterality: Bilateral;   COLONOSCOPY WITH PROPOFOL  N/A 08/21/2013   Procedure: COLONOSCOPY WITH PROPOFOL ;  Surgeon: Brice Campi, MD;  [facility]: [facility];  Service: Endoscopy;  Laterality: N/A;   COLOSTOMY TAKEDOWN N/A 12/04/2014   Procedure: LAPROSCOPIC LYSIS OF ADHESIONS, SPLENIC MOBILIZATION, RELOCATION OF COLOSTOMY, DEBRIDEMENT INITIAL COLOSTOMY SITE;  Surgeon: Candyce Champagne, MD;  Location: [facility];  Service: General;  Laterality: N/A;   CYSTECTOMY W/ URETEROILEAL CONDUIT  04/12/2019   @[facility] ;   INTESTINAL ANASTOMOSIS, PANNICULECTOMY, PELVIS EXENTENTION,  PARTIAL COLECTOMY REMOVAL ILEIUM WITH PERMNANT ILEOCOLOSTOMY   CYSTOGRAM N/A 06/01/2017   Procedure: CYSTOGRAM;  Surgeon: Osborn Blaze, MD;  [facility];  Service: Urology;  Laterality: N/A;   CYSTOSCOPY W/ RETROGRADES Right 11/21/2015   Procedure: CYSTOSCOPY WITH RETROGRADE PYELOGRAM;  Surgeon: Osborn Blaze, MD;  [facility];  Service: Urology;  Laterality: Right;   CYSTOSCOPY W/ URETERAL STENT PLACEMENT Right 11/21/2015   Procedure: CYSTOSCOPY WITH STENT REPLACEMENT;  Surgeon: Osborn Blaze, MD;  [facility];  Service: Urology;  Laterality: Right;   CYSTOSCOPY W/ URETERAL STENT PLACEMENT Right 03/10/2016   Procedure: CYSTOSCOPY WITH STENT REPLACEMENT;  Surgeon: Osborn Blaze, MD;  Location: [facility];  Service: Urology;  Laterality: Right;   CYSTOSCOPY W/ URETERAL STENT PLACEMENT Right 06/30/2016   Procedure: CYSTOSCOPY WITH RETROGRADE PYELOGRAM/URETERAL STENT EXCHANGE;  Surgeon: Osborn Blaze, MD;  Location:  [facility];  Service: Urology;  Laterality: Right;   CYSTOSCOPY W/ URETERAL STENT PLACEMENT N/A 06/01/2017   Procedure: CYSTOSCOPY WITH EXAM UNDER ANESTHESIA;  Surgeon: Osborn Blaze, MD;  [facility];  Service: Urology;  Laterality: N/A;   CYSTOSCOPY W/ URETERAL STENT PLACEMENT Right 08/17/2017   Procedure: CYSTOSCOPY WITH RETROGRADE PYELOGRAM/URETERAL STENT REMOVAL;  Surgeon: Osborn Blaze, MD;  Location: [facility];  Service: Urology;  Laterality: Right;   CYSTOSCOPY WITH RETROGRADE PYELOGRAM, URETEROSCOPY AND STENT PLACEMENT Right 03/20/2015  Procedure: CYSTOSCOPY WITH RETROGRADE PYELOGRAM, URETEROSCOPY WITH BALLOON DILATION AND STENT PLACEMENT ON RIGHT;  Surgeon: Osborn Blaze, MD;  Location: [facility];  Service: Urology;  Laterality: Right;   CYSTOSCOPY WITH RETROGRADE PYELOGRAM, URETEROSCOPY AND STENT PLACEMENT Right 05/02/2015   Procedure: CYSTOSCOPY WITH RIGHT RETROGRADE PYELOGRAM,  DIAGNOSTIC URETEROSCOPY AND STENT PULL ;  Surgeon: Osborn Blaze, MD;  Location: [facility];  Service: Urology;  Laterality: Right;   CYSTOSCOPY WITH RETROGRADE PYELOGRAM, URETEROSCOPY AND STENT PLACEMENT Right 09/05/2015   Procedure: CYSTOSCOPY WITH RETROGRADE PYELOGRAM,  AND STENT PLACEMENT;  Surgeon: Osborn Blaze, MD;  [facility];  Service: Urology;  Laterality: Right;   CYSTOSCOPY WITH RETROGRADE PYELOGRAM, URETEROSCOPY AND STENT PLACEMENT Right 04/01/2017   Procedure: CYSTOSCOPY WITH RETROGRADE PYELOGRAM, URETEROSCOPY AND STENT PLACEMENT;  Surgeon: Osborn Blaze, MD;  [facility];  Service: Urology;  Laterality: Right;   CYSTOSCOPY WITH STENT PLACEMENT Right 10/27/2016   Procedure: CYSTOSCOPY WITH STENT CHANGE and right retrograde pyelogram;  Surgeon: Osborn Blaze, MD;  Location: [facility];  Service: Urology;  Laterality: Right;   EUS N/A 10/02/2014   Procedure: LOWER ENDOSCOPIC ULTRASOUND (EUS);   Surgeon: Evangeline Hilts, MD;  Location: [facility];  Service: Endoscopy;  Laterality: N/A;   EXCISION SOFT TISSUE MASS RIGHT FOREMAN  12/08/2006   EYE SURGERY  as child   pytosis of eyelids repair   FOOT SURGERY  10/2023   Left   INCISION AND DRAINAGE OF WOUND Bilateral 12/26/2015   Procedure: DEBRIDEMENT OF BILATERAL MASTECTOMY FLAPS;  Surgeon: Alger Infield, MD;  Location: [facility];  Service: Plastics;  Laterality: Bilateral;   IR CV LINE INJECTION  05/31/2017   IR FLUORO GUIDE CV LINE LEFT  05/31/2017   IR FLUORO GUIDE CV LINE RIGHT  04/06/2017   IR FLUORO GUIDE CV MIDLINE PICC RIGHT  05/30/2017   IR NEPHROSTOGRAM LEFT INITIAL PLACEMENT  09/02/2017   IR NEPHROSTOGRAM LEFT THRU EXISTING ACCESS  11/29/2017   IR NEPHROSTOGRAM RIGHT INITIAL PLACEMENT  09/02/2017   IR NEPHROSTOGRAM RIGHT THRU EXISTING ACCESS  09/13/2017   IR NEPHROSTOGRAM RIGHT THRU EXISTING ACCESS  11/29/2017   IR NEPHROSTOMY EXCHANGE LEFT  11/28/2017   IR NEPHROSTOMY EXCHANGE LEFT  01/05/2018   IR NEPHROSTOMY EXCHANGE LEFT  02/16/2018   IR NEPHROSTOMY EXCHANGE LEFT  03/30/2018   IR NEPHROSTOMY EXCHANGE LEFT  05/12/2018   IR NEPHROSTOMY EXCHANGE LEFT  06/21/2018   IR NEPHROSTOMY EXCHANGE LEFT  08/04/2018   IR NEPHROSTOMY EXCHANGE LEFT  09/18/2018   IR NEPHROSTOMY EXCHANGE LEFT  10/09/2018   IR NEPHROSTOMY EXCHANGE LEFT  10/27/2018   IR NEPHROSTOMY EXCHANGE LEFT  11/21/2018   IR NEPHROSTOMY EXCHANGE LEFT  01/05/2019   IR NEPHROSTOMY EXCHANGE LEFT  02/15/2019   IR NEPHROSTOMY EXCHANGE LEFT  03/29/2019   IR NEPHROSTOMY EXCHANGE RIGHT  10/02/2017   IR NEPHROSTOMY EXCHANGE RIGHT  11/28/2017   IR NEPHROSTOMY EXCHANGE RIGHT  01/05/2018   IR NEPHROSTOMY EXCHANGE RIGHT  02/16/2018   IR NEPHROSTOMY EXCHANGE RIGHT  03/30/2018   IR NEPHROSTOMY EXCHANGE RIGHT  05/12/2018   IR NEPHROSTOMY EXCHANGE RIGHT  06/21/2018   IR NEPHROSTOMY EXCHANGE RIGHT  08/04/2018   IR NEPHROSTOMY EXCHANGE RIGHT  09/18/2018    IR NEPHROSTOMY EXCHANGE RIGHT  10/27/2018   IR NEPHROSTOMY EXCHANGE RIGHT  11/21/2018   IR NEPHROSTOMY EXCHANGE RIGHT  01/05/2019   IR NEPHROSTOMY EXCHANGE RIGHT  02/15/2019   IR NEPHROSTOMY EXCHANGE RIGHT  03/29/2019   IR NEPHROSTOMY PLACEMENT LEFT  10/02/2017   IR RADIOLOGIST EVAL & MGMT  05/03/2017   IR US  GUIDE VASC ACCESS LEFT  05/31/2017   IR US  GUIDE VASC ACCESS RIGHT  04/06/2017   IR US  GUIDE VASC ACCESS RIGHT  05/30/2017   LAPAROSCOPIC CHOLECYSTECTOMY  1990   LIPOSUCTION WITH LIPOFILLING Bilateral 04/16/2016   Procedure: LIPOSUCTION WITH LIPOFILLING TO BILATERAL CHEST;  Surgeon: Alger Infield, MD;  Location: [facility];  Service: Plastics;  Laterality: Bilateral;   MASTECTOMY W/ SENTINEL NODE BIOPSY Bilateral 12/11/2015   Procedure: RIGHT PROPHYLACTIC MASTECTOMY, LEFT TOTAL MASTECTOMY WITH LEFT AXILLARY SENTINEL LYMPH NODE BIOPSY;  Surgeon: Lockie Rima, MD;  [facility];  Service: General;  Laterality: Bilateral;   OSTOMY N/A 11/19/2014   Procedure: OSTOMY;  Surgeon: Candyce Champagne, MD;  [facility];  Service: General;  Laterality: N/A;   PROCTOSCOPY N/A 04/01/2017   Procedure: RIDGE PROCTOSCOPY;  Surgeon: Candyce Champagne, MD;  [facility];  Service: General;  Laterality: N/A;   REMOVAL OF BILATERAL TISSUE EXPANDERS WITH PLACEMENT OF BILATERAL BREAST IMPLANTS Bilateral 04/16/2016   Procedure: REMOVAL OF BILATERAL TISSUE EXPANDERS WITH PLACEMENT OF BILATERAL BREAST IMPLANTS;  Surgeon: Alger Infield, MD;  Location: [facility];  Service: Plastics;  Laterality: Bilateral;   ROBOTIC ASSISTED LAP VAGINAL HYSTERECTOMY N/A 11/19/2014   Procedure: ROBOTIC LYSIS OF ADHESIONS, CONVERTED TO LAPAROTOMY RADICAL UPPER VAGINECTOMY,LOW ANTERIOR BOWEL RESECTION, COLOSTOMY, BILATERAL URETERAL STENT PLACEMENT AND CYSTONOMY CLOSURE;  Surgeon: Alphonso Aschoff, MD;  Location: [facility];  Service: Gynecology;  Laterality: N/A;   TISSUE EXPANDER FILLING Bilateral  12/26/2015   Procedure: EXPANSION OF BILATERAL CHEST TISSUE EXPANDERS (60 mL- Right; 75 mL- Left);  Surgeon: Alger Infield, MD;  Location: [facility];  Service: Plastics;  Laterality: Bilateral;   TONSILLECTOMY     TOTAL ABDOMINAL HYSTERECTOMY  March 2006   [facility]   and Bilateral Salpingoophorectomy/  staging for Ovarian cancer/  an   VULVAR LESION REMOVAL N/A 08/19/2021   Procedure: VULVAR LESION with biopsies;  Surgeon: Suzi Essex, MD;  Location: [facility];  Service: Gynecology;  Laterality: N/A;   XI ROBOTIC ASSISTED LOWER ANTERIOR RESECTION N/A 04/01/2017   Procedure: XI ROBOTIC VS LAPAROSCOPIC COLOSTOMY TAKEDOWN WITH LYSIS OF ADHESIONS.;  Surgeon: Candyce Champagne, MD;  [facility];  Service: General;  Laterality: N/A;  ERAS PATHWAY    Family History  Problem Relation Age of Onset   Cancer Mother 43       stomach ca   Hypertension Mother    Cancer Father 53       prostate ca   Diabetes Father    Heart disease Father        CABG   Hypertension Father    Hyperlipidemia Father    Obesity Father    Breast cancer Maternal Aunt        dx in her 7s   Lymphoma Paternal Aunt    Brain cancer Paternal Grandfather    Ovarian cancer Other    Diabetes Sister    Hypertension Brother y-10   Heart disease Brother        CABG   Diabetes Brother    Social History   Occupational History   Occupation: retired Charity fundraiser from [employer]    Comment: L&D Charity fundraiser - retired  Tobacco Use   Smoking status: Never    Passive exposure: Never   Smokeless tobacco: Never  Vaping Use   Vaping status: Never Used  Substance and Sexual Activity  Alcohol  use: Yes    Comment: Occ   Drug use: No   Sexual activity: Not Currently    Tobacco Counseling Counseling given: Not Answered   Immunizations and Health Maintenance Immunization History  Administered Date(s) Administered   Fluad Quad(high Dose 65+) 06/19/2021, 05/17/2022, 05/19/2023   Fluzone Influenza virus  vaccine,trivalent (IIV3), split virus 06/19/2013, 05/17/2014   Hepatitis B, ADULT 12/03/1992   Hepatitis B, PED/ADOLESCENT 12/03/1992   Influenza Inj Mdck Quad Pf 05/06/2016   Influenza Split 06/25/2009, 05/03/2011, 05/04/2012, 05/07/2012   Influenza, High Dose Seasonal PF 04/25/2017   Influenza,inj,Quad PF,6+ Mos 04/10/2015, 04/18/2018, 04/20/2019   Influenza-Unspecified 06/25/2009, 05/03/2011, 05/04/2012, 05/07/2012, 06/19/2013, 05/17/2014, 04/10/2015, 05/06/2016, 04/18/2018, 04/20/2019, 05/14/2020   MMR 11/22/2007   Mumps 11/22/2007   PFIZER Comirnaty(Gray Top)Covid-19 Tri-Sucrose Vaccine 09/07/2019, 10/03/2019, 03/19/2020   PFIZER(Purple Top)SARS-COV-2 Vaccination 09/07/2019, 10/03/2019, 03/19/2020   PNEUMOCOCCAL CONJUGATE-20 07/23/2021   PPD Test 05/03/1999   Pneumococcal Conjugate-13 04/21/2015   Pneumococcal Polysaccharide-23 06/15/2012, 08/30/2016   Pneumococcal-Unspecified 06/15/2012   Rsv, Mab, Trudy Fusi, 1 Ml, Neonate To 24 Mos(Beyfortus) 05/19/2023   Rubella 11/22/2007   Td 08/02/1996   Td (Adult),5 Lf Tetanus Toxid, Preservative Free 08/02/1996   Tdap 06/02/2005, 08/28/2015   Zoster Recombinant(Shingrix) 01/20/2021, 03/24/2021   Health Maintenance Due  Topic Date Due   Diabetic kidney evaluation - Urine ACR  02/16/2023   COVID-19 Vaccine (7 - 2024-25 season) 04/03/2023    Activities of Daily Living    12/27/2023    2:04 PM  In your present state of health, do you have any difficulty performing the following activities:  Hearing? 0  Vision? 0  Difficulty concentrating or making decisions? 0  Walking or climbing stairs? 0  Dressing or bathing? 0  Doing errands, shopping? 0    Physical Exam   Physical Exam Vitals and nursing note reviewed.  Constitutional:      General: She is not in acute distress.    Appearance: Normal appearance. She is well-developed.  HENT:     Head: Normocephalic and atraumatic.     Right Ear: Tympanic membrane, ear canal and  external ear normal. There is no impacted cerumen.     Left Ear: Tympanic membrane, ear canal and external ear normal. There is no impacted cerumen.     Nose: Nose normal.     Mouth/Throat:     Mouth: Mucous membranes are moist.     Pharynx: Oropharynx is clear. No oropharyngeal exudate or posterior oropharyngeal erythema.  Eyes:     General: No scleral icterus.       Right eye: No discharge.        Left eye: No discharge.     Conjunctiva/sclera: Conjunctivae normal.     Pupils: Pupils are equal, round, and reactive to light.  Neck:     Thyroid : No thyromegaly or thyroid  tenderness.     Vascular: No JVD.  Cardiovascular:     Rate and Rhythm: Normal rate and regular rhythm.     Heart sounds: Normal heart sounds. No murmur heard. Pulmonary:     Effort: Pulmonary effort is normal. No respiratory distress.     Breath sounds: Normal breath sounds.  Abdominal:     General: Bowel sounds are normal. There is no distension.     Palpations: Abdomen is soft. There is no mass.     Tenderness: There is no abdominal tenderness. There is no guarding or rebound.  Genitourinary:    Vagina: Normal.  Musculoskeletal:        General:  Normal range of motion.     Cervical back: Normal range of motion and neck supple.     Right lower leg: No edema.     Left lower leg: No edema.  Lymphadenopathy:     Cervical: No cervical adenopathy.  Skin:    General: Skin is warm and dry.     Findings: No erythema or rash.  Neurological:     Mental Status: She is alert and oriented to person, place, and time.     Cranial Nerves: No cranial nerve deficit.     Deep Tendon Reflexes: Reflexes are normal and symmetric.  Psychiatric:        Mood and Affect: Mood normal.        Behavior: Behavior normal.        Thought Content: Thought content normal.        Judgment: Judgment normal.    ( Diabetic Foot Exam - Simple   Simple Foot Form Diabetic Foot exam was performed with the following findings: Yes 12/27/2023   6:19 PM  Visual Inspection No deformities, no ulcerations, no other skin breakdown bilaterally: Yes Sensation Testing Intact to touch and monofilament testing bilaterally: Yes Pulse Check Posterior Tibialis and Dorsalis pulse intact bilaterally: Yes Comments      optional), or other factors deemed appropriate based on the beneficiary's medical and social history and current clinical standards.   Advanced Directives: Does Patient Have a Medical Advance Directive?: Yes Type of Advance Directive: Healthcare Power of Attorney, Living will Does patient want to make changes to medical advance directive?: No - Patient declined Copy of Healthcare Power of Attorney in Chart?: No - copy requested  EKG:  normal EKG, normal sinus rhythm, unchanged from previous tracings, done in cardiolog      Assessment:     This is a routine wellness examination for this patient .   Vision/Hearing screen Vision Screening   Right eye Left eye Both eyes  Without correction     With correction 20/20 20/20 20/20   Hearing Screening - Comments:: Normal whisper test    Goals   None     Depression Screen    12/27/2023    2:04 PM 12/07/2022   10:55 AM 02/15/2022    9:52 AM 02/15/2022    9:51 AM  PHQ 2/9 Scores  PHQ - 2 Score 0 0 0 0  PHQ- 9 Score 0  0      Fall Risk    12/27/2023    2:03 PM  Fall Risk   Falls in the past year? 0  Number falls in past yr: 0  Injury with Fall? 0  Risk for fall due to : No Fall Risks    Cognitive Function:    12/27/2023    2:05 PM  MMSE - Mini Mental State Exam  Orientation to time 5  Orientation to Place 5  Registration 3  Attention/ Calculation 5  Recall 3  Language- name 2 objects 2  Language- repeat 1  Language- follow 3 step command 3  Language- read & follow direction 1  Write a sentence 1  Copy design 1  Total score 30        Patient Care Team: Estill Hemming, DO as PCP - General (Family Medicine) Boyce Byes, MD as PCP  - Electrophysiology (Cardiology) Almeda Jacobs, MD as Consulting Physician (Hematology and Oncology) Candyce Champagne, MD as Consulting Physician (General Surgery) Secundino Dach, Harvey Linen., MD as Consulting Physician (Urology) Alvina Axon, MD  as Consulting Physician (Ophthalmology) Leandra Pro, MD as Consulting Physician (Nephrology) Glenora Laos, MD as Consulting Physician (Family Medicine) Heddy Liverpool, MD as Referring Physician (Plastic Surgery) Elon Hakim, MD as Referring Physician (Urology) Suzi Essex, MD as Consulting Physician (Gynecologic Oncology) Kelli Pates, MD as Referring Physician (Urology)     Plan:     I have personally reviewed and noted the following in the patient's chart:   Medical and social history Use of alcohol , tobacco or illicit drugs  Current medications and supplements including opioid prescriptions. {Opioid Prescriptions:[id number]} Functional ability and status Nutritional status Physical activity Advanced directives List of other physicians Hospitalizations, surgeries, and ER visits in previous 12 months Vitals Screenings to include cognitive, depression, and falls Referrals and appointments  In addition, I have reviewed and discussed with patient certain preventive protocols, quality metrics, and best practice recommendations. A written personalized care plan for preventive services as well as general preventive health recommendations were provided to patient. Assessment and Plan Assessment & Plan Postoperative infection of foot   Following foot surgery in April/March, she developed a postoperative infection initially treated with antibiotics. A second course was necessary due to persistent symptoms. Currently, mild redness remains without active infection. Further evaluation may be needed if symptoms persist.  Foot deformities post-surgery   Post-surgery for bunion, hammer toe, and toe deformity, her toe remains  slightly crooked. Swelling may last up to a year, but balance has improved. Consider shoe modifications to accommodate swelling.  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 Dr. Orvil Bland previously performed a digital exam. No recent imaging due to contrast limitations. She should contact Dr. Orvil Bland for further evaluation and send a message regarding the bleeding.  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 Regular tube exchanges occur every eight weeks at [facility]. The possibility of a bud procedure to avoid external bags was discussed and is mostly successful. Continue regular nephrostomy tube exchanges every eight weeks, discuss tube length with the urologist during the next exchange, and consider a bud procedure if necessary.  Diabetes mellitus without complications   Her diabetes is well-controlled with an A1c of 5.5%. [name], her new provider, manages her diabetes care. The next appointment is scheduled in one year. Continue current diabetes management and schedule a follow-up with the endocrinologist in one year.  First degree atrioventricular block   A first degree AV block was noted on a recent EKG at Dr. Candace Cerise office. No new symptoms are reported. No immediate intervention is required.      Delsin Copen R Lowne Chase, DO 12/27/2023

## 2023-12-27 NOTE — Progress Notes (Signed)
 Subjective:    [name] is a 68 y.o. female who presents for a Welcome to Medicare exam.    Discussed the use of AI scribe software for clinical note transcription with the patient, who gave verbal consent to proceed.  History of Present Illness [name] is a 68 year old female who presents for a Medicare wellness visit.  She underwent foot surgery in March or April for bunion and hammer toe correction. Postoperatively, she experienced an infection requiring antibiotics, with a low-grade fever and redness necessitating a second course of antibiotics. The foot remains slightly swollen, especially after prolonged standing, but she is satisfied with the surgical outcome as it has improved her balance.  She has a history of diabetes and recently saw a new provider due to her previous endocrinologist's absence. Her A1c was 5.5. She plans to discontinue biotin  as it was not aiding her hair.  She has stage 4 kidney disease and underwent a procedure to place internal nephrostomy tubes due to blocked ureters. She visits the urologist every eight weeks for tube exchanges and reports some challenges with the tube length during changes.  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 She describes a recent episode of significant bleeding after bending over, followed by clotting and recurrent bleeding. She has not contacted her provider about this recent episode.  She uses Lyrica  and fluticasone , with a recent refill on the latter. She has had issues with insurance coverage since her husband's retirement, affecting her Medicare and [employer].  No memory problems, depression, or hearing issues. She reports some frustration but not depression. She has not been exercising much since her surgery but is considering resuming activity.        Objective:     Today's Vitals   12/27/23 1333  BP: 118/70  Pulse: (!) 58  Resp: 18  Temp: 97.7 F  (36.5 C)  TempSrc: Oral  SpO2: 96%  Weight: 195 lb 9.6 oz (88.7 kg)  Height: 5\' 1"  (1.549 m)  Body mass index is 36.96 kg/m.  Medications Outpatient Encounter Medications as of 12/27/2023  Medication Sig   aspirin  EC 81 MG tablet Take 1 tablet (81 mg total) by mouth daily. Swallow whole.   Biotin  5 MG TABS Take 5 mg by mouth daily.    buPROPion  (WELLBUTRIN ) 100 MG tablet Take 1 tablet (100 mg total) by mouth 2 (two) times daily.   calcium  citrate-vitamin D  (CITRACAL+D) 315-200 MG-UNIT tablet Take 1 tablet by mouth daily.   Cholecalciferol  (VITAMIN D3) 10000 UNITS capsule Take 10,000 Units by mouth See admin instructions. Take 1 tablet by mouth 43times per week.   ciclopirox  (PENLAC ) 8 % solution Apply topically at bedtime. Apply over nail and surrounding skin. Apply daily over previous coat. After seven (7) days, may remove with alcohol  and continue cycle.   clobetasol  (OLUX ) 0.05 % topical foam Apply topically 2 (two) times daily.   Continuous Blood Gluc Sensor (FREESTYLE LIBRE 3 SENSOR) MISC Inject 1 sensor to the skin every 14 days for continuous glucose monitoring.   Cranberry-Vitamin C  15000-100 MG CAPS Take by mouth daily. 1 in Morning and 1 in PM   doxycycline  (VIBRA -TABS) 100 MG tablet Take 1 tablet (100 mg total) by mouth 2 (two) times daily.   famotidine -calcium  carbonate-magnesium  hydroxide (PEPCID  COMPLETE) 10-800-165 MG chewable tablet Chew 1 tablet by mouth as needed.   ferrous sulfate  325 (65 FE) MG tablet Take 1 tablet (325 mg total) by mouth  at bedtime.   filgrastim -aafi (NIVESTYM ) 480 MCG/0.8ML SOSY injection Inject 0.8 mLs (480 mcg total) into the skin every 7 (seven) days.   fluticasone  (FLONASE ) 50 MCG/ACT nasal spray Place 2 sprays into both nostrils daily.   HYDROcodone  bit-homatropine (HYDROMET) 5-1.5 MG/5ML syrup TAKE 5 ML BY MOUTH EVERY 6 HOURS AS NEEDED FOR COUGH   ibuprofen  (ADVIL ) 800 MG tablet Take 1 tablet (800 mg total) by mouth every 6 (six) hours as needed.    levothyroxine  (SYNTHROID , LEVOTHROID) 150 MCG tablet Take 1 tablet (150 mcg total) by mouth daily before breakfast.   loratadine  (CLARITIN ) 10 MG tablet Take 10 mg by mouth daily.    MOUNJARO  5 MG/0.5ML Pen Inject 5 mg into the skin once a week.   moxifloxacin  (VIGAMOX ) 0.5 % ophthalmic solution Place 1 drop into both eyes 3 (three) times daily.   omega-3 acid ethyl esters (LOVAZA ) 1 G capsule Take 1 g by mouth 2 (two) times daily.    Polyethyl Glycol-Propyl Glycol (SYSTANE OP) Place 1 drop into both eyes daily as needed (dry eyes).    Prenatal Vit-Fe Fumarate-FA (PRENATAL VITAMIN PO) Take 1 capsule by mouth daily. Takes prenatal because there are no dyes in it   Probiotic Product (ALIGN PO) Take 1 capsule by mouth daily.   rosuvastatin  (CRESTOR ) 10 MG tablet Take 10 mg by mouth every evening.   Saccharomyces boulardii (FLORASTOR PO) Take 1 capsule by mouth at bedtime.   Tazarotene  (ARAZLO ) 0.045 % LOTN Apply 1 application topically daily.   tiZANidine  (ZANAFLEX ) 4 MG tablet Take 0.5-1 tablets (2-4 mg total) by mouth every 8 (eight) hours as needed for muscle spasms.   UNABLE TO FIND 4 mg every day by oral route.   XIFAXAN 550 MG TABS tablet Take 550 mg by mouth as needed.   [DISCONTINUED] pregabalin  (LYRICA ) 50 MG capsule Take 1 capsule by mouth twice daily.   pregabalin  (LYRICA ) 50 MG capsule Take 1 capsule (50 mg total) by mouth 2 (two) times daily.   No facility-administered encounter medications on file as of 12/27/2023.     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    oncologist-- dr rossi/ dr gorsuch:/   dx endometroid endometrial & ovarian cancer s/p  chemotheapy and surgery(TAH w/ BSO) :  recurrent 11-19-2014 post pelvic surgery and radiation 01-29-2015 to 03-10-2015   Chronic idiopathic neutropenia (HCC)    presumed related to chemotherapy March 2006--- followed by dr Marton Sleeper (treatment w/ G-CSF injections   Chronic nausea    CKD (chronic kidney disease), stage III [facility])    nephrologist-  dr Leandra Pro   Difficult intravenous access    small veins--- hx PICC lines   DM type 2 (diabetes mellitus, type 2) (HCC)    endocriologist--  dr Herminia Lope Hildy Lowers   (08-18-2021  per pt checks blood sugar daily in am,  fasting sugar--92--130)   Dysrhythmia    Environmental and seasonal allergies    Generalized muscle weakness    GERD (gastroesophageal reflux disease)    Hiatal hernia    History of abdominal abscess 04/16/2017   post  surgery 04-01-2017  --- resolved 10/ 2018   History of COVID-19 05/2021   per pt mild symptoms that resolved   History of gastric polyp    2014  duodenum   History of ileus 04/16/2017   resolved w/ no surgical intervention   History of radiation therapy    01-29-2015 to 03-10-2015  pelvis 50.4Gy   History of small bowel obstruction 01/2019   Hypothyroidism    monitored by dr Bambi Lever altheimer   Ileostomy in place [facility]) 04/01/2017   created at same time colostomy takedown./  permnant 09/ 2020   Mixed dyslipidemia    Multiple thyroid  nodules    Managed by Dr. Sofia Dunn   [facility]F [facility]oxysmal [facility] fibrillation) [facility])    cardiologist--- dr h. 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   Vitamin D   deficiency    Vulval lesion    Wears glasses    Past Surgical History:  Procedure Laterality Date   APPENDECTOMY     BREAST RECONSTRUCTION WITH PLACEMENT OF TISSUE EXPANDER AND FLEX HD (ACELLULAR HYDRATED DERMIS) Bilateral 12/11/2015   Procedure: BILATERAL BREAST RECONSTRUCTION WITH PLACEMENT OF TISSUE EXPANDERS;  Surgeon: Alger Infield, MD;  [facility]: [facility];  Service: Plastics;  Laterality: Bilateral;   COLONOSCOPY WITH PROPOFOL  N/A 08/21/2013   Procedure: COLONOSCOPY WITH PROPOFOL ;  Surgeon: Brice Campi, MD;  [facility];  Service: Endoscopy;  Laterality: N/A;   COLOSTOMY TAKEDOWN N/A 12/04/2014   Procedure: LAPROSCOPIC LYSIS OF ADHESIONS, SPLENIC MOBILIZATION, RELOCATION OF COLOSTOMY, DEBRIDEMENT INITIAL COLOSTOMY SITE;  Surgeon: Candyce Champagne, MD;  [facility]: [facility];  Service: General;  Laterality: N/A;   CYSTECTOMY W/ URETEROILEAL CONDUIT  04/12/2019   @[facility] ;   INTESTINAL ANASTOMOSIS, PANNICULECTOMY, PELVIS EXENTENTION,  PARTIAL COLECTOMY REMOVAL ILEIUM WITH PERMNANT ILEOCOLOSTOMY   CYSTOGRAM N/A 06/01/2017   Procedure: CYSTOGRAM;  Surgeon: Osborn Blaze, MD;  [facility];  Service: Urology;  Laterality: N/A;   CYSTOSCOPY W/ RETROGRADES Right 11/21/2015   Procedure: CYSTOSCOPY WITH RETROGRADE PYELOGRAM;  Surgeon: Osborn Blaze, MD;  [facility];  Service: Urology;  Laterality: Right;   CYSTOSCOPY W/ URETERAL STENT PLACEMENT Right 11/21/2015   Procedure: CYSTOSCOPY WITH STENT REPLACEMENT;  Surgeon: Osborn Blaze, MD;  [facility];  Service: Urology;  Laterality: Right;   CYSTOSCOPY W/ URETERAL STENT PLACEMENT Right 03/10/2016   Procedure: CYSTOSCOPY WITH STENT REPLACEMENT;  Surgeon: Osborn Blaze, MD;  Location: [facility];  Service: Urology;  Laterality: Right;   CYSTOSCOPY W/ URETERAL STENT PLACEMENT Right 06/30/2016   Procedure: CYSTOSCOPY WITH RETROGRADE PYELOGRAM/URETERAL STENT EXCHANGE;  Surgeon: Osborn Blaze, MD;  Location:  [facility];  Service: Urology;  Laterality: Right;   CYSTOSCOPY W/ URETERAL STENT PLACEMENT N/A 06/01/2017   Procedure: CYSTOSCOPY WITH EXAM UNDER ANESTHESIA;  Surgeon: Osborn Blaze, MD;  [facility];  Service: Urology;  Laterality: N/A;   CYSTOSCOPY W/ URETERAL STENT PLACEMENT Right 08/17/2017   Procedure: CYSTOSCOPY WITH RETROGRADE PYELOGRAM/URETERAL STENT REMOVAL;  Surgeon: Osborn Blaze, MD;  Location: [facility];  Service: Urology;  Laterality: Right;   CYSTOSCOPY WITH RETROGRADE PYELOGRAM, URETEROSCOPY AND STENT PLACEMENT Right 03/20/2015  Procedure: CYSTOSCOPY WITH RETROGRADE PYELOGRAM, URETEROSCOPY WITH BALLOON DILATION AND STENT PLACEMENT ON RIGHT;  Surgeon: Osborn Blaze, MD;  Location: [facility];  Service: Urology;  Laterality: Right;   CYSTOSCOPY WITH RETROGRADE PYELOGRAM, URETEROSCOPY AND STENT PLACEMENT Right 05/02/2015   Procedure: CYSTOSCOPY WITH RIGHT RETROGRADE PYELOGRAM,  DIAGNOSTIC URETEROSCOPY AND STENT PULL ;  Surgeon: Osborn Blaze, MD;  Location: [facility];  Service: Urology;  Laterality: Right;   CYSTOSCOPY WITH RETROGRADE PYELOGRAM, URETEROSCOPY AND STENT PLACEMENT Right 09/05/2015   Procedure: CYSTOSCOPY WITH RETROGRADE PYELOGRAM,  AND STENT PLACEMENT;  Surgeon: Osborn Blaze, MD;  [facility];  Service: Urology;  Laterality: Right;   CYSTOSCOPY WITH RETROGRADE PYELOGRAM, URETEROSCOPY AND STENT PLACEMENT Right 04/01/2017   Procedure: CYSTOSCOPY WITH RETROGRADE PYELOGRAM, URETEROSCOPY AND STENT PLACEMENT;  Surgeon: Osborn Blaze, MD;  [facility];  Service: Urology;  Laterality: Right;   CYSTOSCOPY WITH STENT PLACEMENT Right 10/27/2016   Procedure: CYSTOSCOPY WITH STENT CHANGE and right retrograde pyelogram;  Surgeon: Osborn Blaze, MD;  Location: [facility];  Service: Urology;  Laterality: Right;   EUS N/A 10/02/2014   Procedure: LOWER ENDOSCOPIC ULTRASOUND (EUS);   Surgeon: Evangeline Hilts, MD;  Location: [facility];  Service: Endoscopy;  Laterality: N/A;   EXCISION SOFT TISSUE MASS RIGHT FOREMAN  12/08/2006   EYE SURGERY  as child   pytosis of eyelids repair   FOOT SURGERY  10/2023   Left   INCISION AND DRAINAGE OF WOUND Bilateral 12/26/2015   Procedure: DEBRIDEMENT OF BILATERAL MASTECTOMY FLAPS;  Surgeon: Alger Infield, MD;  Location: [facility];  Service: Plastics;  Laterality: Bilateral;   IR CV LINE INJECTION  05/31/2017   IR FLUORO GUIDE CV LINE LEFT  05/31/2017   IR FLUORO GUIDE CV LINE RIGHT  04/06/2017   IR FLUORO GUIDE CV MIDLINE PICC RIGHT  05/30/2017   IR NEPHROSTOGRAM LEFT INITIAL PLACEMENT  09/02/2017   IR NEPHROSTOGRAM LEFT THRU EXISTING ACCESS  11/29/2017   IR NEPHROSTOGRAM RIGHT INITIAL PLACEMENT  09/02/2017   IR NEPHROSTOGRAM RIGHT THRU EXISTING ACCESS  09/13/2017   IR NEPHROSTOGRAM RIGHT THRU EXISTING ACCESS  11/29/2017   IR NEPHROSTOMY EXCHANGE LEFT  11/28/2017   IR NEPHROSTOMY EXCHANGE LEFT  01/05/2018   IR NEPHROSTOMY EXCHANGE LEFT  02/16/2018   IR NEPHROSTOMY EXCHANGE LEFT  03/30/2018   IR NEPHROSTOMY EXCHANGE LEFT  05/12/2018   IR NEPHROSTOMY EXCHANGE LEFT  06/21/2018   IR NEPHROSTOMY EXCHANGE LEFT  08/04/2018   IR NEPHROSTOMY EXCHANGE LEFT  09/18/2018   IR NEPHROSTOMY EXCHANGE LEFT  10/09/2018   IR NEPHROSTOMY EXCHANGE LEFT  10/27/2018   IR NEPHROSTOMY EXCHANGE LEFT  11/21/2018   IR NEPHROSTOMY EXCHANGE LEFT  01/05/2019   IR NEPHROSTOMY EXCHANGE LEFT  02/15/2019   IR NEPHROSTOMY EXCHANGE LEFT  03/29/2019   IR NEPHROSTOMY EXCHANGE RIGHT  10/02/2017   IR NEPHROSTOMY EXCHANGE RIGHT  11/28/2017   IR NEPHROSTOMY EXCHANGE RIGHT  01/05/2018   IR NEPHROSTOMY EXCHANGE RIGHT  02/16/2018   IR NEPHROSTOMY EXCHANGE RIGHT  03/30/2018   IR NEPHROSTOMY EXCHANGE RIGHT  05/12/2018   IR NEPHROSTOMY EXCHANGE RIGHT  06/21/2018   IR NEPHROSTOMY EXCHANGE RIGHT  08/04/2018   IR NEPHROSTOMY EXCHANGE RIGHT  09/18/2018    IR NEPHROSTOMY EXCHANGE RIGHT  10/27/2018   IR NEPHROSTOMY EXCHANGE RIGHT  11/21/2018   IR NEPHROSTOMY EXCHANGE RIGHT  01/05/2019   IR NEPHROSTOMY EXCHANGE RIGHT  02/15/2019   IR NEPHROSTOMY EXCHANGE RIGHT  03/29/2019   IR NEPHROSTOMY PLACEMENT LEFT  10/02/2017   IR RADIOLOGIST EVAL & MGMT  05/03/2017   IR US  GUIDE VASC ACCESS LEFT  05/31/2017   IR US  GUIDE VASC ACCESS RIGHT  04/06/2017   IR US  GUIDE VASC ACCESS RIGHT  05/30/2017   LAPAROSCOPIC CHOLECYSTECTOMY  1990   LIPOSUCTION WITH LIPOFILLING Bilateral 04/16/2016   Procedure: LIPOSUCTION WITH LIPOFILLING TO BILATERAL CHEST;  Surgeon: Alger Infield, MD;  Location: [facility];  Service: Plastics;  Laterality: Bilateral;   MASTECTOMY W/ SENTINEL NODE BIOPSY Bilateral 12/11/2015   Procedure: RIGHT PROPHYLACTIC MASTECTOMY, LEFT TOTAL MASTECTOMY WITH LEFT AXILLARY SENTINEL LYMPH NODE BIOPSY;  Surgeon: Lockie Rima, MD;  [facility];  Service: General;  Laterality: Bilateral;   OSTOMY N/A 11/19/2014   Procedure: OSTOMY;  Surgeon: Candyce Champagne, MD;  [facility];  Service: General;  Laterality: N/A;   PROCTOSCOPY N/A 04/01/2017   Procedure: RIDGE PROCTOSCOPY;  Surgeon: Candyce Champagne, MD;  [facility];  Service: General;  Laterality: N/A;   REMOVAL OF BILATERAL TISSUE EXPANDERS WITH PLACEMENT OF BILATERAL BREAST IMPLANTS Bilateral 04/16/2016   Procedure: REMOVAL OF BILATERAL TISSUE EXPANDERS WITH PLACEMENT OF BILATERAL BREAST IMPLANTS;  Surgeon: Alger Infield, MD;  Location: [facility];  Service: Plastics;  Laterality: Bilateral;   ROBOTIC ASSISTED LAP VAGINAL HYSTERECTOMY N/A 11/19/2014   Procedure: ROBOTIC LYSIS OF ADHESIONS, CONVERTED TO LAPAROTOMY RADICAL UPPER VAGINECTOMY,LOW ANTERIOR BOWEL RESECTION, COLOSTOMY, BILATERAL URETERAL STENT PLACEMENT AND CYSTONOMY CLOSURE;  Surgeon: Alphonso Aschoff, MD;  [facility];  Service: Gynecology;  Laterality: N/A;   TISSUE EXPANDER FILLING Bilateral  12/26/2015   Procedure: EXPANSION OF BILATERAL CHEST TISSUE EXPANDERS (60 mL- Right; 75 mL- Left);  Surgeon: Alger Infield, MD;  Location: [facility];  Service: Plastics;  Laterality: Bilateral;   TONSILLECTOMY     TOTAL ABDOMINAL HYSTERECTOMY  March 2006   [facility]   and Bilateral Salpingoophorectomy/  staging for Ovarian cancer/  an   VULVAR LESION REMOVAL N/A 08/19/2021   Procedure: VULVAR LESION with biopsies;  Surgeon: Suzi Essex, MD;  Location: [facility];  Service: Gynecology;  Laterality: N/A;   XI ROBOTIC ASSISTED LOWER ANTERIOR RESECTION N/A 04/01/2017   Procedure: XI ROBOTIC VS LAPAROSCOPIC COLOSTOMY TAKEDOWN WITH LYSIS OF ADHESIONS.;  Surgeon: Candyce Champagne, MD;  [facility];  Service: General;  Laterality: N/A;  ERAS PATHWAY    Family History  Problem Relation Age of Onset   Cancer Mother 76       stomach ca   Hypertension Mother    Cancer Father 82       prostate ca   Diabetes Father    Heart disease Father        CABG   Hypertension Father    Hyperlipidemia Father    Obesity Father    Breast cancer Maternal Aunt        dx in her 24s   Lymphoma Paternal Aunt    Brain cancer Paternal Grandfather    Ovarian cancer Other    Diabetes Sister    Hypertension Brother y-10   Heart disease Brother        CABG   Diabetes Brother    Social History   Occupational History   Occupation: retired Charity fundraiser from [employer]    Comment: L&D Charity fundraiser - retired  Tobacco Use   Smoking status: Never    Passive exposure: Never   Smokeless tobacco: Never  Vaping Use   Vaping status: Never Used  Substance and Sexual Activity  Alcohol  use: Yes    Comment: Occ   Drug use: No   Sexual activity: Not Currently    Tobacco Counseling Counseling given: Not Answered   Immunizations and Health Maintenance Immunization History  Administered Date(s) Administered   Fluad Quad(high Dose 65+) 06/19/2021, 05/17/2022, 05/19/2023   Fluzone Influenza virus  vaccine,trivalent (IIV3), split virus 06/19/2013, 05/17/2014   Hepatitis B, ADULT 12/03/1992   Hepatitis B, PED/ADOLESCENT 12/03/1992   Influenza Inj Mdck Quad Pf 05/06/2016   Influenza Split 06/25/2009, 05/03/2011, 05/04/2012, 05/07/2012   Influenza, High Dose Seasonal PF 04/25/2017   Influenza,inj,Quad PF,6+ Mos 04/10/2015, 04/18/2018, 04/20/2019   Influenza-Unspecified 06/25/2009, 05/03/2011, 05/04/2012, 05/07/2012, 06/19/2013, 05/17/2014, 04/10/2015, 05/06/2016, 04/18/2018, 04/20/2019, 05/14/2020   MMR 11/22/2007   Mumps 11/22/2007   PFIZER Comirnaty(Gray Top)Covid-19 Tri-Sucrose Vaccine 09/07/2019, 10/03/2019, 03/19/2020   PFIZER(Purple Top)SARS-COV-2 Vaccination 09/07/2019, 10/03/2019, 03/19/2020   PNEUMOCOCCAL CONJUGATE-20 07/23/2021   PPD Test 05/03/1999   Pneumococcal Conjugate-13 04/21/2015   Pneumococcal Polysaccharide-23 06/15/2012, 08/30/2016   Pneumococcal-Unspecified 06/15/2012   Rsv, Mab, Trudy Fusi, 1 Ml, Neonate To 24 Mos(Beyfortus) 05/19/2023   Rubella 11/22/2007   Td 08/02/1996   Td (Adult),5 Lf Tetanus Toxid, Preservative Free 08/02/1996   Tdap 06/02/2005, 08/28/2015   Zoster Recombinant(Shingrix) 01/20/2021, 03/24/2021   Health Maintenance Due  Topic Date Due   Diabetic kidney evaluation - Urine ACR  02/16/2023   COVID-19 Vaccine (7 - 2024-25 season) 04/03/2023    Activities of Daily Living    12/27/2023    2:04 PM  In your present state of health, do you have any difficulty performing the following activities:  Hearing? 0  Vision? 0  Difficulty concentrating or making decisions? 0  Walking or climbing stairs? 0  Dressing or bathing? 0  Doing errands, shopping? 0    Physical Exam   Physical Exam Vitals and nursing note reviewed.  Constitutional:      General: She is not in acute distress.    Appearance: Normal appearance. She is well-developed.  HENT:     Head: Normocephalic and atraumatic.     Right Ear: Tympanic membrane, ear canal and  external ear normal. There is no impacted cerumen.     Left Ear: Tympanic membrane, ear canal and external ear normal. There is no impacted cerumen.     Nose: Nose normal.     Mouth/Throat:     Mouth: Mucous membranes are moist.     Pharynx: Oropharynx is clear. No oropharyngeal exudate or posterior oropharyngeal erythema.  Eyes:     General: No scleral icterus.       Right eye: No discharge.        Left eye: No discharge.     Conjunctiva/sclera: Conjunctivae normal.     Pupils: Pupils are equal, round, and reactive to light.  Neck:     Thyroid : No thyromegaly or thyroid  tenderness.     Vascular: No JVD.  Cardiovascular:     Rate and Rhythm: Normal rate and regular rhythm.     Heart sounds: Normal heart sounds. No murmur heard. Pulmonary:     Effort: Pulmonary effort is normal. No respiratory distress.     Breath sounds: Normal breath sounds.  Abdominal:     General: Bowel sounds are normal. There is no distension.     Palpations: Abdomen is soft. There is no mass.     Tenderness: There is no abdominal tenderness. There is no guarding or rebound.  Genitourinary:    Vagina: Normal.  Musculoskeletal:        General:  Normal range of motion.     Cervical back: Normal range of motion and neck supple.     Right lower leg: No edema.     Left lower leg: No edema.  Lymphadenopathy:     Cervical: No cervical adenopathy.  Skin:    General: Skin is warm and dry.     Findings: No erythema or rash.  Neurological:     Mental Status: She is alert and oriented to person, place, and time.     Cranial Nerves: No cranial nerve deficit.     Deep Tendon Reflexes: Reflexes are normal and symmetric.  Psychiatric:        Mood and Affect: Mood normal.        Thought Content: Thought content normal.        Judgment: Judgment normal.    (optional), or other factors deemed appropriate based on the beneficiary's medical and social history and current clinical standards.   Advanced  Directives: Does Patient Have a Medical Advance Directive?: Yes Type of Advance Directive: Healthcare Power of Attorney, Living will Does patient want to make changes to medical advance directive?: No - Patient declined Copy of Healthcare Power of Attorney in Chart?: No - copy requested  EKG:  normal EKG, normal sinus rhythm, unchanged from previous tracings      Assessment:     This is a routine wellness examination for this patient .   Vision/Hearing screen Vision Screening   Right eye Left eye Both eyes  Without correction     With correction 20/20 20/20 20/20   Hearing Screening - Comments:: Normal whisper test    Goals   None     Depression Screen    12/27/2023    2:04 PM 12/07/2022   10:55 AM 02/15/2022    9:52 AM 02/15/2022    9:51 AM  PHQ 2/9 Scores  PHQ - 2 Score 0 0 0 0  PHQ- 9 Score 0  0      Fall Risk    12/27/2023    2:03 PM  Fall Risk   Falls in the past year? 0  Number falls in past yr: 0  Injury with Fall? 0  Risk for fall due to : No Fall Risks    Cognitive Function:    12/27/2023    2:05 PM  MMSE - Mini Mental State Exam  Orientation to time 5  Orientation to Place 5  Registration 3  Attention/ Calculation 5  Recall 3  Language- name 2 objects 2  Language- repeat 1  Language- follow 3 step command 3  Language- read & follow direction 1  Write a sentence 1  Copy design 1  Total score 30        Patient Care Team: Estill Hemming, DO as PCP - General (Family Medicine) Boyce Byes, MD as PCP - Electrophysiology (Cardiology) Almeda Jacobs, MD as Consulting Physician (Hematology and Oncology) Candyce Champagne, MD as Consulting Physician (General Surgery) Secundino Dach Harvey Linen., MD as Consulting Physician (Urology) Alvina Axon, MD as Consulting Physician (Ophthalmology) Leandra Pro, MD as Consulting Physician (Nephrology) Glenora Laos, MD as Consulting Physician (Family Medicine) Heddy Liverpool, MD as Referring  Physician (Plastic Surgery) Elon Hakim, MD as Referring Physician (Urology) Suzi Essex, MD as Consulting Physician (Gynecologic Oncology) Kelli Pates, MD as Referring Physician (Urology)     Plan:     I have personally reviewed and noted the following in the patient's chart:   Medical and  social history Use of alcohol , tobacco or illicit drugs  Current medications and supplements including opioid prescriptions. Patient is not currently taking opioid prescriptions. Functional ability and status Nutritional status Physical activity Advanced directives List of other physicians Hospitalizations, surgeries, and ER visits in previous 12 months Vitals Screenings to include cognitive, depression, and falls Referrals and appointments  In addition, I have reviewed and discussed with patient certain preventive protocols, quality metrics, and best practice recommendations. A written personalized care plan for preventive services as well as general preventive health recommendations were provided to patient.   Assessment and Plan Assessment & Plan Postoperative infection of foot   Following foot surgery in April/March, she developed a postoperative infection initially treated with antibiotics. A second course was necessary due to persistent symptoms. Currently, mild redness remains without active infection. Further evaluation may be needed if symptoms persist.  Foot deformities post-surgery   Post-surgery for bunion, hammer toe, and toe deformity, her toe remains slightly crooked. Swelling may last up to a year, but balance has improved. Consider shoe modifications to accommodate swelling.  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 Dr. Orvil Bland previously performed a digital exam. No recent imaging due to contrast limitations. She should contact Dr. Orvil Bland for further evaluation and send a message regarding  the bleeding.  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 Regular tube exchanges occur every eight weeks at [facility]. The possibility of a bud procedure to avoid external bags was discussed and is mostly successful. Continue regular nephrostomy tube exchanges every eight weeks, discuss tube length with the urologist during the next exchange, and consider a bud procedure if necessary.  Diabetes mellitus without complications   Her diabetes is well-controlled with an A1c of 5.5%. Ayesha Bold, her new provider, manages her diabetes care. The next appointment is scheduled in one year. Continue current diabetes management and schedule a follow-up with the endocrinologist in one year.  First degree atrioventricular block   A first degree AV block was noted on a recent EKG at Dr. Candace Cerise office. No new symptoms are reported. No immediate intervention is required.    Lauralyn Shadowens R Lowne Chase, DO 12/27/2023

## 2023-12-27 NOTE — Progress Notes (Deleted)
 Subjective:    [name] is a 68 y.o. female who presents for a Welcome to Medicare exam.          Objective:     Today's Vitals   12/27/23 1333  BP: 118/70  Pulse: (!) 58  Resp: 18  Temp: 97.7 F (36.5 C)  TempSrc: Oral  SpO2: 96%  Weight: 195 lb 9.6 oz (88.7 kg)  Height: 5\' 1"  (1.549 m)  Body mass index is 36.96 kg/m.  Medications Outpatient Encounter Medications as of 12/27/2023  Medication Sig   aspirin  EC 81 MG tablet Take 1 tablet (81 mg total) by mouth daily. Swallow whole.   Biotin  5 MG TABS Take 5 mg by mouth daily.    buPROPion  (WELLBUTRIN ) 100 MG tablet Take 1 tablet (100 mg total) by mouth 2 (two) times daily.   calcium  citrate-vitamin D  (CITRACAL+D) 315-200 MG-UNIT tablet Take 1 tablet by mouth daily.   Cholecalciferol  (VITAMIN D3) 10000 UNITS capsule Take 10,000 Units by mouth See admin instructions. Take 1 tablet by mouth 43times per week.   ciclopirox  (PENLAC ) 8 % solution Apply topically at bedtime. Apply over nail and surrounding skin. Apply daily over previous coat. After seven (7) days, may remove with alcohol  and continue cycle.   clobetasol  (OLUX ) 0.05 % topical foam Apply topically 2 (two) times daily.   Continuous Blood Gluc Sensor (FREESTYLE LIBRE 3 SENSOR) MISC Inject 1 sensor to the skin every 14 days for continuous glucose monitoring.   Cranberry-Vitamin C  15000-100 MG CAPS Take by mouth daily. 1 in Morning and 1 in PM   doxycycline  (VIBRA -TABS) 100 MG tablet Take 1 tablet (100 mg total) by mouth 2 (two) times daily.   famotidine -calcium  carbonate-magnesium  hydroxide (PEPCID  COMPLETE) 10-800-165 MG chewable tablet Chew 1 tablet by mouth as needed.   ferrous sulfate  325 (65 FE) MG tablet Take 1 tablet (325 mg total) by mouth at bedtime.   filgrastim -aafi (NIVESTYM ) 480 MCG/0.8ML SOSY injection Inject 0.8 mLs (480 mcg total) into the skin every 7 (seven) days.   fluticasone  (FLONASE ) 50 MCG/ACT nasal spray Place 2 sprays into both  nostrils daily.   HYDROcodone  bit-homatropine (HYDROMET) 5-1.5 MG/5ML syrup TAKE 5 ML BY MOUTH EVERY 6 HOURS AS NEEDED FOR COUGH   ibuprofen  (ADVIL ) 800 MG tablet Take 1 tablet (800 mg total) by mouth every 6 (six) hours as needed.   levothyroxine  (SYNTHROID , LEVOTHROID) 150 MCG tablet Take 1 tablet (150 mcg total) by mouth daily before breakfast.   loratadine  (CLARITIN ) 10 MG tablet Take 10 mg by mouth daily.    MOUNJARO  5 MG/0.5ML Pen Inject 5 mg into the skin once a week.   moxifloxacin  (VIGAMOX ) 0.5 % ophthalmic solution Place 1 drop into both eyes 3 (three) times daily.   omega-3 acid ethyl esters (LOVAZA ) 1 G capsule Take 1 g by mouth 2 (two) times daily.    Polyethyl Glycol-Propyl Glycol (SYSTANE OP) Place 1 drop into both eyes daily as needed (dry eyes).    Prenatal Vit-Fe Fumarate-FA (PRENATAL VITAMIN PO) Take 1 capsule by mouth daily. Takes prenatal because there are no dyes in it   Probiotic Product (ALIGN PO) Take 1 capsule by mouth daily.   rosuvastatin  (CRESTOR ) 10 MG tablet Take 10 mg by mouth every evening.   Saccharomyces boulardii (FLORASTOR PO) Take 1 capsule by mouth at bedtime.   Tazarotene  (ARAZLO ) 0.045 % LOTN Apply 1 application topically daily.   tiZANidine  (ZANAFLEX ) 4 MG tablet Take 0.5-1 tablets (2-4 mg total) by  mouth every 8 (eight) hours as needed for muscle spasms.   UNABLE TO FIND 4 mg every day by oral route.   XIFAXAN 550 MG TABS tablet Take 550 mg by mouth as needed.   [DISCONTINUED] pregabalin  (LYRICA ) 50 MG capsule Take 1 capsule by mouth twice daily.   pregabalin  (LYRICA ) 50 MG capsule Take 1 capsule (50 mg total) by mouth 2 (two) times daily.   No facility-administered encounter medications on file as of 12/27/2023.     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   oncologist-- dr rossi/ dr gorsuch:/   dx endometroid endometrial & ovarian cancer s/p  chemotheapy and surgery(TAH w/ BSO) :  recurrent 11-19-2014 post pelvic surgery and radiation 01-29-2015 to 03-10-2015   Chronic idiopathic neutropenia (HCC)    presumed related to chemotherapy March 2006--- followed by dr Marton Sleeper (treatment w/ G-CSF injections   Chronic nausea    CKD (chronic kidney disease), stage III [facility])    nephrologist-  dr Leandra Pro   Difficult intravenous access    small veins--- hx PICC lines   DM type 2 (diabetes mellitus, type 2) (HCC)    endocriologist--  dr Herminia Lope Hildy Lowers   (08-18-2021  per pt checks blood sugar daily in am,  fasting sugar--92--130)   Dysrhythmia    Environmental and seasonal allergies    Generalized muscle weakness    GERD (gastroesophageal reflux disease)    Hiatal hernia    History of abdominal abscess 04/16/2017   post surgery 04-01-2017  --- resolved 10/ 2018   History of COVID-19 05/2021   per pt mild symptoms that resolved   History of gastric polyp    2014  duodenum   History of ileus 04/16/2017   resolved w/ no surgical intervention   History of radiation therapy    01-29-2015 to 03-10-2015  pelvis 50.4Gy   History of small bowel obstruction 01/2019   Hypothyroidism    monitored by dr Bambi Lever altheimer   Ileostomy in place [facility]) 04/01/2017   created at same time colostomy takedown./  permnant 09/ 2020   Mixed dyslipidemia    Multiple thyroid  nodules    Managed by Dr. Sofia Dunn   PAF (paroxysmal atrial fibrillation) [facility])    cardiologist--- dr h. Felipe Horton;  event monitor 05/ 2021 in epic, NSR with 1% burden Afib, first degree heart blockl and PACs;  echo 04/ 2021 ef 55-60%, mild LVH   Peripheral neuropathy    PONV  (postoperative nausea and vomiting)    "scopolamine  patch works for me"   Presence of urostomy (HCC) 04/2019   Radiation-induced dermatitis    contact dermatitis , radiation completed, rash only on ankles now.   Vitamin D  deficiency    Vulval lesion    Wears glasses    Past Surgical History:  Procedure Laterality Date   APPENDECTOMY     BREAST RECONSTRUCTION WITH PLACEMENT OF TISSUE EXPANDER AND FLEX HD (ACELLULAR HYDRATED DERMIS) Bilateral 12/11/2015   Procedure: BILATERAL BREAST RECONSTRUCTION WITH PLACEMENT OF TISSUE EXPANDERS;  Surgeon: Alger Infield, MD;  Location: [facility];  Service: Plastics;  Laterality:  Bilateral;   COLONOSCOPY WITH PROPOFOL  N/A 08/21/2013   Procedure: COLONOSCOPY WITH PROPOFOL ;  Surgeon: Brice Campi, MD;  Location: [facility];  Service: Endoscopy;  Laterality: N/A;   COLOSTOMY TAKEDOWN N/A 12/04/2014   Procedure: LAPROSCOPIC LYSIS OF ADHESIONS, SPLENIC MOBILIZATION, RELOCATION OF COLOSTOMY, DEBRIDEMENT INITIAL COLOSTOMY SITE;  Surgeon: Candyce Champagne, MD;  Location: [facility];  Service: General;  Laterality: N/A;   CYSTECTOMY W/ URETEROILEAL CONDUIT  04/12/2019   @[facility] ;   INTESTINAL ANASTOMOSIS, PANNICULECTOMY, PELVIS EXENTENTION,  PARTIAL COLECTOMY REMOVAL ILEIUM WITH PERMNANT ILEOCOLOSTOMY   CYSTOGRAM N/A 06/01/2017   Procedure: CYSTOGRAM;  Surgeon: Osborn Blaze, MD;  [facility]: [facility];  Service: Urology;  Laterality: N/A;   CYSTOSCOPY W/ RETROGRADES Right 11/21/2015   Procedure: CYSTOSCOPY WITH RETROGRADE PYELOGRAM;  Surgeon: Osborn Blaze, MD;  [facility];  Service: Urology;  Laterality: Right;   CYSTOSCOPY W/ URETERAL STENT PLACEMENT Right 11/21/2015   Procedure: CYSTOSCOPY WITH STENT REPLACEMENT;  Surgeon: Osborn Blaze, MD;  [facility];  Service: Urology;  Laterality: Right;   CYSTOSCOPY W/ URETERAL STENT PLACEMENT Right 03/10/2016   Procedure: CYSTOSCOPY WITH STENT REPLACEMENT;  Surgeon: Osborn Blaze, MD;  Location: [facility];  Service: Urology;  Laterality: Right;   CYSTOSCOPY W/ URETERAL STENT PLACEMENT Right 06/30/2016   Procedure: CYSTOSCOPY WITH RETROGRADE PYELOGRAM/URETERAL STENT EXCHANGE;  Surgeon: Osborn Blaze, MD;  Location: [facility];  Service: Urology;  Laterality: Right;   CYSTOSCOPY W/ URETERAL STENT PLACEMENT N/A 06/01/2017   Procedure: CYSTOSCOPY WITH EXAM UNDER ANESTHESIA;  Surgeon: Osborn Blaze, MD;  [facility];  Service: Urology;  Laterality: N/A;   CYSTOSCOPY W/ URETERAL STENT PLACEMENT Right 08/17/2017   Procedure: CYSTOSCOPY WITH RETROGRADE PYELOGRAM/URETERAL STENT REMOVAL;  Surgeon: Osborn Blaze, MD;  Location[facility];  Service: Urology;  Laterality: Right;   CYSTOSCOPY WITH RETROGRADE PYELOGRAM, URETEROSCOPY AND STENT PLACEMENT Right 03/20/2015   Procedure: CYSTOSCOPY WITH RETROGRADE PYELOGRAM, URETEROSCOPY WITH BALLOON DILATION AND STENT PLACEMENT ON RIGHT;  Surgeon: Osborn Blaze, MD;  Location: [facility];  Service: Urology;  Laterality: Right;   CYSTOSCOPY WITH RETROGRADE PYELOGRAM, URETEROSCOPY AND STENT PLACEMENT Right 05/02/2015   Procedure: CYSTOSCOPY WITH RIGHT RETROGRADE PYELOGRAM,  DIAGNOSTIC URETEROSCOPY AND STENT PULL ;  Surgeon: Osborn Blaze, MD;  Location: [facility];  Service: Urology;  Laterality: Right;   CYSTOSCOPY WITH RETROGRADE PYELOGRAM, URETEROSCOPY AND STENT PLACEMENT Right 09/05/2015   Procedure: CYSTOSCOPY WITH RETROGRADE PYELOGRAM,  AND STENT PLACEMENT;  Surgeon: Osborn Blaze, MD;  [facility];  Service: Urology;  Laterality: Right;   CYSTOSCOPY WITH RETROGRADE PYELOGRAM, URETEROSCOPY AND STENT PLACEMENT Right 04/01/2017   Procedure: CYSTOSCOPY WITH RETROGRADE PYELOGRAM, URETEROSCOPY AND STENT PLACEMENT;  Surgeon: Osborn Blaze, MD;  [facility];  Service: Urology;  Laterality: Right;   CYSTOSCOPY WITH STENT PLACEMENT Right 10/27/2016   Procedure: CYSTOSCOPY WITH  STENT CHANGE and right retrograde pyelogram;  Surgeon: Osborn Blaze, MD;  Location: [facility];  Service: Urology;  Laterality: Right;   EUS N/A 10/02/2014   Procedure: LOWER ENDOSCOPIC ULTRASOUND (EUS);  Surgeon: Evangeline Hilts, MD;  Location: [facility];  Service: Endoscopy;  Laterality: N/A;   EXCISION SOFT TISSUE MASS RIGHT FOREMAN  12/08/2006   EYE SURGERY  as child   pytosis of eyelids repair   FOOT SURGERY  10/2023   Left   INCISION AND DRAINAGE OF WOUND Bilateral 12/26/2015   Procedure: DEBRIDEMENT OF BILATERAL MASTECTOMY FLAPS;  Surgeon: Alger Infield, MD;  Location: [facility];  Service: Plastics;  Laterality: Bilateral;   IR CV LINE INJECTION  05/31/2017   IR FLUORO GUIDE CV LINE LEFT  05/31/2017   IR FLUORO GUIDE CV LINE RIGHT  04/06/2017   IR FLUORO GUIDE CV MIDLINE PICC RIGHT  05/30/2017   IR NEPHROSTOGRAM LEFT INITIAL PLACEMENT  09/02/2017   IR NEPHROSTOGRAM LEFT THRU EXISTING ACCESS  11/29/2017   IR NEPHROSTOGRAM RIGHT INITIAL PLACEMENT  09/02/2017   IR NEPHROSTOGRAM RIGHT THRU EXISTING ACCESS  09/13/2017   IR NEPHROSTOGRAM RIGHT THRU EXISTING ACCESS  11/29/2017   IR NEPHROSTOMY EXCHANGE LEFT  11/28/2017   IR NEPHROSTOMY EXCHANGE LEFT  01/05/2018   IR NEPHROSTOMY EXCHANGE LEFT  02/16/2018   IR NEPHROSTOMY EXCHANGE LEFT  03/30/2018   IR NEPHROSTOMY EXCHANGE LEFT  05/12/2018   IR NEPHROSTOMY EXCHANGE LEFT  06/21/2018   IR NEPHROSTOMY EXCHANGE LEFT  08/04/2018   IR NEPHROSTOMY EXCHANGE LEFT  09/18/2018   IR NEPHROSTOMY EXCHANGE LEFT  10/09/2018   IR NEPHROSTOMY EXCHANGE LEFT  10/27/2018   IR NEPHROSTOMY EXCHANGE LEFT  11/21/2018   IR NEPHROSTOMY EXCHANGE LEFT  01/05/2019   IR NEPHROSTOMY EXCHANGE LEFT  02/15/2019   IR NEPHROSTOMY EXCHANGE LEFT  03/29/2019   IR NEPHROSTOMY EXCHANGE RIGHT  10/02/2017   IR NEPHROSTOMY EXCHANGE RIGHT  11/28/2017   IR NEPHROSTOMY EXCHANGE RIGHT  01/05/2018   IR NEPHROSTOMY EXCHANGE RIGHT  02/16/2018    IR NEPHROSTOMY EXCHANGE RIGHT  03/30/2018   IR NEPHROSTOMY EXCHANGE RIGHT  05/12/2018   IR NEPHROSTOMY EXCHANGE RIGHT  06/21/2018   IR NEPHROSTOMY EXCHANGE RIGHT  08/04/2018   IR NEPHROSTOMY EXCHANGE RIGHT  09/18/2018   IR NEPHROSTOMY EXCHANGE RIGHT  10/27/2018   IR NEPHROSTOMY EXCHANGE RIGHT  11/21/2018   IR NEPHROSTOMY EXCHANGE RIGHT  01/05/2019   IR NEPHROSTOMY EXCHANGE RIGHT  02/15/2019   IR NEPHROSTOMY EXCHANGE RIGHT  03/29/2019   IR NEPHROSTOMY PLACEMENT LEFT  10/02/2017   IR RADIOLOGIST EVAL & MGMT  05/03/2017   IR US  GUIDE VASC ACCESS LEFT  05/31/2017   IR US  GUIDE VASC ACCESS RIGHT  04/06/2017   IR US  GUIDE VASC ACCESS RIGHT  05/30/2017   LAPAROSCOPIC CHOLECYSTECTOMY  1990   LIPOSUCTION WITH LIPOFILLING Bilateral 04/16/2016   Procedure: LIPOSUCTION WITH LIPOFILLING TO BILATERAL CHEST;  Surgeon: Alger Infield, MD;  Location: [facility];  Service: Plastics;  Laterality: Bilateral;   MASTECTOMY W/ SENTINEL NODE BIOPSY Bilateral 12/11/2015   Procedure: RIGHT PROPHYLACTIC MASTECTOMY, LEFT TOTAL MASTECTOMY WITH LEFT AXILLARY SENTINEL LYMPH NODE BIOPSY;  Surgeon: Lockie Rima, MD;  Location[facility];  Service: General;  Laterality: Bilateral;   OSTOMY N/A 11/19/2014   Procedure: OSTOMY;  Surgeon: Candyce Champagne, MD;  [facility];  Service: General;  Laterality: N/A;   PROCTOSCOPY N/A 04/01/2017   Procedure: RIDGE PROCTOSCOPY;  Surgeon: Candyce Champagne, MD;  [facility];  Service: General;  Laterality: N/A;   REMOVAL OF BILATERAL TISSUE EXPANDERS WITH PLACEMENT OF BILATERAL BREAST IMPLANTS Bilateral 04/16/2016   Procedure: REMOVAL OF BILATERAL TISSUE EXPANDERS WITH PLACEMENT OF BILATERAL BREAST IMPLANTS;  Surgeon: Alger Infield, MD;  Location: [facility];  Service: Plastics;  Laterality: Bilateral;   ROBOTIC ASSISTED LAP VAGINAL HYSTERECTOMY N/A 11/19/2014   Procedure: ROBOTIC LYSIS OF ADHESIONS, CONVERTED TO LAPAROTOMY RADICAL UPPER VAGINECTOMY,LOW  ANTERIOR BOWEL RESECTION, COLOSTOMY, BILATERAL URETERAL STENT PLACEMENT AND CYSTONOMY CLOSURE;  Surgeon: Alphonso Aschoff, MD;  Location: [facility];  Service: Gynecology;  Laterality: N/A;   TISSUE EXPANDER FILLING Bilateral 12/26/2015   Procedure:  EXPANSION OF BILATERAL CHEST TISSUE EXPANDERS (60 mL- Right; 75 mL- Left);  Surgeon: Alger Infield, MD;  Location: [facility];  Service: Plastics;  Laterality: Bilateral;   TONSILLECTOMY     TOTAL ABDOMINAL HYSTERECTOMY  March 2006   [facility]   and Bilateral Salpingoophorectomy/  staging for Ovarian cancer/  an   VULVAR LESION REMOVAL N/A 08/19/2021   Procedure: VULVAR LESION with biopsies;  Surgeon: Suzi Essex, MD;  Location: [facility];  Service: Gynecology;  Laterality: N/A;   XI ROBOTIC ASSISTED LOWER ANTERIOR RESECTION N/A 04/01/2017   Procedure: XI ROBOTIC VS LAPAROSCOPIC COLOSTOMY TAKEDOWN WITH LYSIS OF ADHESIONS.;  Surgeon: Candyce Champagne, MD;  Location[facility];  Service: General;  Laterality: N/A;  ERAS PATHWAY    Family History  Problem Relation Age of Onset   Cancer Mother 35       stomach ca   Hypertension Mother    Cancer Father 60       prostate ca   Diabetes Father    Heart disease Father        CABG   Hypertension Father    Hyperlipidemia Father    Obesity Father    Breast cancer Maternal Aunt        dx in her 6s   Lymphoma Paternal Aunt    Brain cancer Paternal Grandfather    Ovarian cancer Other    Diabetes Sister    Hypertension Brother y-10   Heart disease Brother        CABG   Diabetes Brother    Social History   Occupational History   Occupation: retired Charity fundraiser from [employer]    Comment: L&D Charity fundraiser - retired  Tobacco Use   Smoking status: Never    Passive exposure: Never   Smokeless tobacco: Never  Vaping Use   Vaping status: Never Used  Substance and Sexual Activity   Alcohol  use: Yes    Comment: Occ   Drug use: No   Sexual activity: Not Currently    Tobacco  Counseling Counseling given: Not Answered   Immunizations and Health Maintenance Immunization History  Administered Date(s) Administered   Fluad Quad(high Dose 65+) 06/19/2021, 05/17/2022, 05/19/2023   Fluzone Influenza virus vaccine,trivalent (IIV3), split virus 06/19/2013, 05/17/2014   Hepatitis B, ADULT 12/03/1992   Hepatitis B, PED/ADOLESCENT 12/03/1992   Influenza Inj Mdck Quad Pf 05/06/2016   Influenza Split 06/25/2009, 05/03/2011, 05/04/2012, 05/07/2012   Influenza, High Dose Seasonal PF 04/25/2017   Influenza,inj,Quad PF,6+ Mos 04/10/2015, 04/18/2018, 04/20/2019   Influenza-Unspecified 06/25/2009, 05/03/2011, 05/04/2012, 05/07/2012, 06/19/2013, 05/17/2014, 04/10/2015, 05/06/2016, 04/18/2018, 04/20/2019, 05/14/2020   MMR 11/22/2007   Mumps 11/22/2007   PFIZER Comirnaty(Gray Top)Covid-19 Tri-Sucrose Vaccine 09/07/2019, 10/03/2019, 03/19/2020   PFIZER(Purple Top)SARS-COV-2 Vaccination 09/07/2019, 10/03/2019, 03/19/2020   PNEUMOCOCCAL CONJUGATE-20 07/23/2021   PPD Test 05/03/1999   Pneumococcal Conjugate-13 04/21/2015   Pneumococcal Polysaccharide-23 06/15/2012, 08/30/2016   Pneumococcal-Unspecified 06/15/2012   Rsv, Mab, Trudy Fusi, 1 Ml, Neonate To 24 Mos(Beyfortus) 05/19/2023   Rubella 11/22/2007   Td 08/02/1996   Td (Adult),5 Lf Tetanus Toxid, Preservative Free 08/02/1996   Tdap 06/02/2005, 08/28/2015   Zoster Recombinant(Shingrix) 01/20/2021, 03/24/2021   Health Maintenance Due  Topic Date Due   Diabetic kidney evaluation - Urine ACR  02/16/2023   COVID-19 Vaccine (7 - 2024-25 season) 04/03/2023    Activities of Daily Living    12/27/2023    2:04 PM  In your present state of health, do you have any difficulty performing the following activities:  Hearing?  0  Vision? 0  Difficulty concentrating or making decisions? 0  Walking or climbing stairs? 0  Dressing or bathing? 0  Doing errands, shopping? 0    Physical Exam   Physical Exam (optional), or other  factors deemed appropriate based on the beneficiary's medical and social history and current clinical standards.   Advanced Directives: Does Patient Have a Medical Advance Directive?: Yes Type of Advance Directive: Healthcare Power of Attorney, Living will Does patient want to make changes to medical advance directive?: No - Patient declined Copy of Healthcare Power of Attorney in Chart?: No - copy requested  EKG:  {ekg findings:315101}      Assessment:     This is a routine wellness examination for this patient . ***  Vision/Hearing screen Vision Screening   Right eye Left eye Both eyes  Without correction     With correction 20/20 20/20 20/20   Hearing Screening - Comments:: Normal whisper test    Goals   None     Depression Screen    12/27/2023    2:04 PM 12/07/2022   10:55 AM 02/15/2022    9:52 AM 02/15/2022    9:51 AM  PHQ 2/9 Scores  PHQ - 2 Score 0 0 0 0  PHQ- 9 Score 0  0      Fall Risk    12/27/2023    2:03 PM  Fall Risk   Falls in the past year? 0  Number falls in past yr: 0  Injury with Fall? 0  Risk for fall due to : No Fall Risks    Cognitive Function:    12/27/2023    2:05 PM  MMSE - Mini Mental State Exam  Orientation to time 5  Orientation to Place 5  Registration 3  Attention/ Calculation 5  Recall 3  Language- name 2 objects 2  Language- repeat 1  Language- follow 3 step command 3  Language- read & follow direction 1  Write a sentence 1  Copy design 1  Total score 30        Patient Care Team: Crecencio Dodge, Candida Chalk, DO as PCP - General (Family Medicine) Boyce Byes, MD as PCP - Electrophysiology (Cardiology) Almeda Jacobs, MD as Consulting Physician (Hematology and Oncology) Candyce Champagne, MD as Consulting Physician (General Surgery) Secundino Dach Harvey Linen., MD as Consulting Physician (Urology) Alvina Axon, MD as Consulting Physician (Ophthalmology) Leandra Pro, MD as Consulting Physician (Nephrology) Glenora Laos, MD as Consulting Physician (Family Medicine) Heddy Liverpool, MD as Referring Physician (Plastic Surgery) Elon Hakim, MD as Referring Physician (Urology) Suzi Essex, MD as Consulting Physician (Gynecologic Oncology) Kelli Pates, MD as Referring Physician (Urology)     Plan:   ***  I have personally reviewed and noted the following in the patient's chart:   Medical and social history Use of alcohol , tobacco or illicit drugs  Current medications and supplements including opioid prescriptions. {Opioid [phone]} Functional ability and status Nutritional status Physical activity Advanced directives List of other physicians Hospitalizations, surgeries, and ER visits in previous 12 months Vitals Screenings to include cognitive, depression, and falls Referrals and appointments  In addition, I have reviewed and discussed with patient certain preventive protocols, quality metrics, and best practice recommendations. A written personalized care plan for preventive services as well as general preventive health recommendations were provided to patient.     Leul Narramore R Lowne Chase, DO 12/27/2023

## 2023-12-27 NOTE — Progress Notes (Deleted)
   Established Patient Office Visit  Subjective   Patient ID: Angeligue Bowne, female    DOB: 1956-03-04  Age: 68 y.o. MRN: [id number]  Chief Complaint  Patient presents with   Annual Exam    HPI  {History (Optional):23778}  ROS    Objective:     BP 118/70 (BP Location: Left Arm, Patient Position: Sitting, Cuff Size: Large)   Pulse (!) 58   Temp 97.7 F (36.5 C) (Oral)   Resp 18   Ht 5\' 1"  (1.549 m)   Wt 195 lb 9.6 oz (88.7 kg)   SpO2 96%   BMI 36.96 kg/m  {Vitals History (Optional):23777}  Physical Exam   No results found for any visits on 12/27/23.  {Labs (Optional):23779}  The ASCVD Risk score (Arnett DK, et al., 2019) failed to calculate for the following reasons:   The valid total cholesterol range is 130 to 320 mg/dL    Assessment & Plan:   Problem List Items Addressed This Visit       Unprioritized   Chronic pain   Relevant Medications   pregabalin  (LYRICA ) 50 MG capsule    No follow-ups on file.    Berkeley Vanaken R Lowne Chase, DO

## 2023-12-28 DIAGNOSIS — K080 Exfoliation of teeth due to systemic causes: Secondary | ICD-10-CM | POA: Diagnosis not present

## 2024-01-05 ENCOUNTER — Other Ambulatory Visit: Payer: Self-pay | Admitting: Family Medicine

## 2024-01-05 ENCOUNTER — Encounter: Payer: Self-pay | Admitting: Podiatry

## 2024-01-05 ENCOUNTER — Other Ambulatory Visit: Payer: Self-pay | Admitting: Podiatry

## 2024-01-05 MED ORDER — CICLOPIROX 8 % EX SOLN: Freq: Every day | CUTANEOUS | 0 refills | Status: AC

## 2024-01-06 ENCOUNTER — Telehealth: Payer: Self-pay

## 2024-01-06 NOTE — Telephone Encounter (Signed)
 Notified Patient of prior authorization approval for Nivystem 417mcg/0.8ml. Medication is approved through 07/07/2024. No other needs or concerns noted at this time.

## 2024-01-06 NOTE — Telephone Encounter (Signed)
 Returned her call. Nivestym  injection needs PA, she received a letter in the mail. She has 2 week supply left after using 1 injection this weekend. Will send a message to prior authorization person and told her I would call her back on her cell next week when the office finds out about the PA. She ask that the office call her on mobile due to being on vacation.

## 2024-02-09 DIAGNOSIS — Z9889 Other specified postprocedural states: Secondary | ICD-10-CM | POA: Diagnosis not present

## 2024-02-09 DIAGNOSIS — N131 Hydronephrosis with ureteral stricture, not elsewhere classified: Secondary | ICD-10-CM | POA: Diagnosis not present

## 2024-02-14 DIAGNOSIS — N390 Urinary tract infection, site not specified: Secondary | ICD-10-CM | POA: Diagnosis not present

## 2024-02-14 DIAGNOSIS — N1832 Chronic kidney disease, stage 3b: Secondary | ICD-10-CM | POA: Diagnosis not present

## 2024-02-14 DIAGNOSIS — I129 Hypertensive chronic kidney disease with stage 1 through stage 4 chronic kidney disease, or unspecified chronic kidney disease: Secondary | ICD-10-CM | POA: Diagnosis not present

## 2024-02-14 DIAGNOSIS — E1122 Type 2 diabetes mellitus with diabetic chronic kidney disease: Secondary | ICD-10-CM | POA: Diagnosis not present

## 2024-02-14 DIAGNOSIS — Z8719 Personal history of other diseases of the digestive system: Secondary | ICD-10-CM | POA: Diagnosis not present

## 2024-02-16 ENCOUNTER — Ambulatory Visit (INDEPENDENT_AMBULATORY_CARE_PROVIDER_SITE_OTHER): Admitting: Family Medicine

## 2024-02-16 ENCOUNTER — Other Ambulatory Visit: Payer: Self-pay | Admitting: Nephrology

## 2024-02-16 ENCOUNTER — Encounter: Payer: Self-pay | Admitting: Nephrology

## 2024-02-16 VITALS — BP 107/69 | HR 78 | Temp 97.6°F | Ht 61.0 in | Wt 183.0 lb

## 2024-02-16 DIAGNOSIS — F3289 Other specified depressive episodes: Secondary | ICD-10-CM

## 2024-02-16 DIAGNOSIS — Z8719 Personal history of other diseases of the digestive system: Secondary | ICD-10-CM

## 2024-02-16 DIAGNOSIS — R5383 Other fatigue: Secondary | ICD-10-CM | POA: Diagnosis not present

## 2024-02-16 DIAGNOSIS — F5089 Other specified eating disorder: Secondary | ICD-10-CM | POA: Diagnosis not present

## 2024-02-16 DIAGNOSIS — E1169 Type 2 diabetes mellitus with other specified complication: Secondary | ICD-10-CM

## 2024-02-16 DIAGNOSIS — Z6834 Body mass index (BMI) 34.0-34.9, adult: Secondary | ICD-10-CM

## 2024-02-16 DIAGNOSIS — J3089 Other allergic rhinitis: Secondary | ICD-10-CM | POA: Diagnosis not present

## 2024-02-16 DIAGNOSIS — E669 Obesity, unspecified: Secondary | ICD-10-CM

## 2024-02-16 DIAGNOSIS — E119 Type 2 diabetes mellitus without complications: Secondary | ICD-10-CM

## 2024-02-16 MED ORDER — FLUTICASONE PROPIONATE 50 MCG/ACT NA SUSP: 2.0000 | Freq: Every day | NASAL | 3 refills | Status: DC

## 2024-02-16 MED ORDER — BUPROPION HCL 100 MG PO TABS: 100.0000 mg | ORAL_TABLET | Freq: Two times a day (BID) | ORAL | 0 refills | Status: DC

## 2024-02-16 NOTE — Progress Notes (Signed)
 Office: [phone]  /  Fax: [phone]  WEIGHT SUMMARY AND BIOMETRICS  Anthropometric Measurements Height: 5' 1 (1.549 m) Weight: 183 lb (83 kg) BMI (Calculated): 34.6 Weight at Last Visit: 189 lb Weight Lost Since Last Visit: 6 lb Weight Gained Since Last Visit: 0 Starting Weight: 208 lb Total Weight Loss (lbs): 25 lb (11.3 kg) Peak Weight: 300 lb   Body Composition  Body Fat %: 46.1 % Fat Mass (lbs): 84.4 lbs Muscle Mass (lbs): 93.6 lbs Total Body Water  (lbs): 67.2 lbs Visceral Fat Rating : 14   Other Clinical Data Fasting: no Labs: no Today's Visit #: 36 Starting Date: 04/05/18    Chief Complaint: OBESITY   Discussed the use of AI scribe software for clinical note transcription with the patient, who gave verbal consent to proceed.  History of Present Illness Fe Okubo is a 68 year old female who presents with obesity treatment and management of abdominal pain.  She is undergoing treatment for obesity, which includes a Mounjaro  implant, a calorie-restricted diet of 1300 calories, and 70-80 grams of protein daily. She has lost six pounds over the last two months.  She describes experiencing 'weird pain' in her side and back before a family vacation, which persisted throughout the trip. Despite the pain, she managed to participate in some activities but often had to rest while her family continued without her. She feels unwell after eating, with symptoms worsening after a large meal. On returning from vacation, she experienced severe abdominal pain, which she described as feeling like a bowel obstruction. She had no bowel movements for over 24 hours and experienced intense pain, leading to vomiting and dry heaves, which provided some relief. She took a Senna tablet and eventually had bowel movement relief around 5:30 AM the following day, but experienced significant rebound tenderness and cramping afterward.  She had a nephrostomy tube exchange scheduled  and noted that her urine had been looking bad with sediment. After the tube exchange, her urine appeared clearer, but she continued to feel unwell and experienced numbness and eating difficulties. She was told by radiology staff that she might have a urinary tract infection and is awaiting culture results.  She has been experiencing persistent nausea and fatigue, requiring naps after minimal activity. She has not been taking her usual medications, including Wellbutrin  and Flonase , except for 25 mg of Benadryl  at night to aid sleep. She has been sleeping sitting up due to discomfort.  She reports difficulty accessing timely medical care and expresses frustration with the healthcare system, noting long wait times for appointments and challenges in getting urgent care.      PHYSICAL EXAM:  Blood pressure 107/69, pulse 78, temperature 97.6 F (36.4 C), height 5' 1 (1.549 m), weight 183 lb (83 kg), SpO2 100%. Body mass index is 34.58 kg/m.  DIAGNOSTIC DATA REVIEWED:  BMET    Component Value Date/Time   NA 138 08/22/2023 1015   NA 141 01/11/2022 1544   NA 141 01/24/2017 1228   K 4.3 08/22/2023 1015   K 4.6 01/24/2017 1228   CL 102 08/22/2023 1015   CO2 24 08/22/2023 1015   CO2 29 01/24/2017 1228   GLUCOSE 132 (H) 08/22/2023 1015   GLUCOSE 84 01/24/2017 1228   BUN 31 (H) 08/22/2023 1015   BUN 29 (H) 01/11/2022 1544   BUN 22.2 01/24/2017 1228   CREATININE 1.73 (H) 08/22/2023 1015   CREATININE 2.20 (H) 06/04/2022 1450   CREATININE 1.91 (H) 01/18/2020 1440  CREATININE 0.8 01/24/2017 1228   CALCIUM  9.5 08/22/2023 1015   CALCIUM  10.2 01/24/2017 1228   GFRNONAA 24 (L) 06/04/2022 1450   GFRNONAA 78 12/16/2014 1530   GFRAA 38 (L) 06/16/2020 0834   GFRAA 30 (L) 03/27/2018 1324   GFRAA >89 12/16/2014 1530   Lab Results  Component Value Date   HGBA1C 6.4 08/22/2023   HGBA1C 6.3 (H) 11/07/2014   Lab Results  Component Value Date   INSULIN  14.1 06/16/2020   Lab Results  Component  Value Date   TSH 1.23 08/22/2023   CBC    Component Value Date/Time   WBC 9.7 10/06/2023 1112   RBC 4.22 10/06/2023 1112   HGB 12.5 10/06/2023 1112   HGB 11.3 (L) 06/04/2022 1450   HGB 11.5 01/11/2022 1544   HGB 12.4 07/29/2017 1444   HCT 40.1 10/06/2023 1112   HCT 35.5 01/11/2022 1544   HCT 38.0 07/29/2017 1444   PLT 257 10/06/2023 1112   PLT 184 06/04/2022 1450   PLT 207 01/11/2022 1544   MCV 95.0 10/06/2023 1112   MCV 94 01/11/2022 1544   MCV 90.9 07/29/2017 1444   MCH 29.6 10/06/2023 1112   MCHC 31.2 10/06/2023 1112   RDW 14.6 10/06/2023 1112   RDW 13.1 01/11/2022 1544   RDW 15.5 (H) 07/29/2017 1444   Iron  Studies    Component Value Date/Time   IRON  7 (L) 08/31/2017 0451   IRON  12 (L) 11/29/2014 1251   TIBC 164 (L) 08/31/2017 0451   TIBC 331 11/29/2014 1251   FERRITIN 27 11/29/2014 1251   IRONPCTSAT 4 (L) 08/31/2017 0451   IRONPCTSAT 4 (L) 11/29/2014 1251   Lipid Panel     Component Value Date/Time   CHOL 77 02/17/2023 0948   CHOL 92 (L) 06/16/2020 0834   TRIG 118.0 02/17/2023 0948   HDL 39.20 02/17/2023 0948   HDL 49 06/16/2020 0834   CHOLHDL 2 02/17/2023 0948   VLDL 23.6 02/17/2023 0948   LDLCALC 14 02/17/2023 0948   LDLCALC 21 06/16/2020 0834   LDLCALC 13 01/18/2020 1440   Hepatic Function Panel     Component Value Date/Time   PROT 7.5 08/22/2023 1015   PROT 7.8 06/16/2020 0834   PROT 6.9 01/24/2017 1228   ALBUMIN  3.9 08/22/2023 1015   ALBUMIN  4.4 06/16/2020 0834   ALBUMIN  3.4 (L) 01/24/2017 1228   AST 18 08/22/2023 1015   AST 12 (L) 06/04/2022 1450   AST 16 01/24/2017 1228   ALT 24 08/22/2023 1015   ALT 14 06/04/2022 1450   ALT 14 01/24/2017 1228   ALKPHOS 159 (H) 08/22/2023 1015   ALKPHOS 90 01/24/2017 1228   BILITOT 0.4 08/22/2023 1015   BILITOT 0.6 06/04/2022 1450   BILITOT 0.34 01/24/2017 1228      Component Value Date/Time   TSH 1.23 08/22/2023 1015   Nutritional Lab Results  Component Value Date   VD25OH 44.37 08/22/2023    VD25OH 36.40 02/17/2023   VD25OH 31.09 02/15/2022     Assessment and Plan Assessment & Plan Bowel Obstruction She experienced a bowel obstruction with severe pain, nausea, and vomiting. She has a history of multiple bowel obstructions. The obstruction resolved spontaneously after taking Senna and experiencing vomiting. There is concern that Mounjaro  may be contributing to these obstructions. - Discontinue Mounjaro  until further evaluation - Schedule CT scan with oral contrast for further evaluation  Obesity She is undergoing treatment for obesity, which includes a Mounjaro  implant, a calorie-restricted diet of 1300 calories, and 70-80  grams of protein daily. She has lost six pounds in the last two months. There is concern that Mounjaro  may be contributing to bowel obstructions. - Discontinue Mounjaro  until further evaluation  Type 2 Diabetes Mellitus Her blood sugar levels have been rising, possibly due to dietary changes necessitated by her gastrointestinal symptoms. Mounjaro , which was part of her diabetes management, has been discontinued due to concerns about bowel obstruction. Alternative diabetes management options will be considered after further evaluation, taking into account her kidney function. - Monitor blood sugar levels - Consider alternative diabetes management options after further evaluation   Allergic Rhinitis She has allergic rhinitis and has been prescribed Flonase , although she has not been taking it recently. - Refill Flonase  prescription  Fatigue and Low Energy with EEB She is experiencing significant fatigue and low energy levels, impacting her daily activities. She has been taking Benadryl  at night to aid sleep due to discomfort. She has not been taking Wellbutrin  or other medications recently due to her current health status. - Refill Wellbutrin  prescription - Schedule follow-up appointment in three months    She was informed of the importance of frequent  follow up visits to maximize her success with intensive lifestyle modifications for her multiple health conditions.    Louann Penton, MD

## 2024-02-21 ENCOUNTER — Ambulatory Visit
Admission: RE | Admit: 2024-02-21 | Discharge: 2024-02-21 | Disposition: A | Discharge status: Home / Self Care | Source: Ambulatory Visit | Attending: Nephrology | Admitting: Nephrology

## 2024-02-21 ENCOUNTER — Ambulatory Visit: Payer: BC Managed Care – PPO | Admitting: Family Medicine

## 2024-02-21 DIAGNOSIS — Z906 Acquired absence of other parts of urinary tract: Secondary | ICD-10-CM | POA: Diagnosis not present

## 2024-02-21 DIAGNOSIS — D3502 Benign neoplasm of left adrenal gland: Secondary | ICD-10-CM | POA: Diagnosis not present

## 2024-02-21 DIAGNOSIS — Z8719 Personal history of other diseases of the digestive system: Secondary | ICD-10-CM

## 2024-02-23 ENCOUNTER — Ambulatory Visit (INDEPENDENT_AMBULATORY_CARE_PROVIDER_SITE_OTHER): Payer: BC Managed Care – PPO | Admitting: Family Medicine

## 2024-02-23 ENCOUNTER — Encounter: Payer: Self-pay | Admitting: Family Medicine

## 2024-02-23 VITALS — BP 112/76 | HR 81 | Temp 97.6°F | Resp 18 | Ht 61.0 in | Wt 185.8 lb

## 2024-02-23 DIAGNOSIS — K5901 Slow transit constipation: Secondary | ICD-10-CM | POA: Diagnosis not present

## 2024-02-23 DIAGNOSIS — N3000 Acute cystitis without hematuria: Secondary | ICD-10-CM | POA: Diagnosis not present

## 2024-02-23 DIAGNOSIS — G8929 Other chronic pain: Secondary | ICD-10-CM

## 2024-02-23 DIAGNOSIS — E785 Hyperlipidemia, unspecified: Secondary | ICD-10-CM | POA: Diagnosis not present

## 2024-02-23 DIAGNOSIS — I480 Paroxysmal atrial fibrillation: Secondary | ICD-10-CM

## 2024-02-23 DIAGNOSIS — E1122 Type 2 diabetes mellitus with diabetic chronic kidney disease: Secondary | ICD-10-CM

## 2024-02-23 DIAGNOSIS — E1169 Type 2 diabetes mellitus with other specified complication: Secondary | ICD-10-CM | POA: Diagnosis not present

## 2024-02-23 DIAGNOSIS — N1832 Chronic kidney disease, stage 3b: Secondary | ICD-10-CM

## 2024-02-23 DIAGNOSIS — E559 Vitamin D deficiency, unspecified: Secondary | ICD-10-CM

## 2024-02-23 DIAGNOSIS — Z8719 Personal history of other diseases of the digestive system: Secondary | ICD-10-CM | POA: Diagnosis not present

## 2024-02-23 DIAGNOSIS — E039 Hypothyroidism, unspecified: Secondary | ICD-10-CM

## 2024-02-23 LAB — HEMOGLOBIN A1C: Hgb A1c MFr Bld: 6.8 % — ABNORMAL HIGH (ref 4.6–6.5)

## 2024-02-23 LAB — CBC WITH DIFFERENTIAL/PLATELET
Basophils Absolute: 0.1 K/uL (ref 0.0–0.1)
Basophils Relative: 1.1 % (ref 0.0–3.0)
Eosinophils Absolute: 0.2 K/uL (ref 0.0–0.7)
Eosinophils Relative: 3 % (ref 0.0–5.0)
HCT: 38.6 % (ref 36.0–46.0)
Hemoglobin: 12.6 g/dL (ref 12.0–15.0)
Lymphocytes Relative: 17 % (ref 12.0–46.0)
Lymphs Abs: 0.9 K/uL (ref 0.7–4.0)
MCHC: 32.7 g/dL (ref 30.0–36.0)
MCV: 88.4 fl (ref 78.0–100.0)
Monocytes Absolute: 0.7 K/uL (ref 0.1–1.0)
Monocytes Relative: 13.5 % — ABNORMAL HIGH (ref 3.0–12.0)
Neutro Abs: 3.6 K/uL (ref 1.4–7.7)
Neutrophils Relative %: 65.4 % (ref 43.0–77.0)
Platelets: 287 K/uL (ref 150.0–400.0)
RBC: 4.37 Mil/uL (ref 3.87–5.11)
RDW: 15.9 % — ABNORMAL HIGH (ref 11.5–15.5)
WBC: 5.5 K/uL (ref 4.0–10.5)

## 2024-02-23 LAB — COMPREHENSIVE METABOLIC PANEL WITH GFR
ALT: 33 U/L (ref 0–35)
AST: 15 U/L (ref 0–37)
Albumin: 3.9 g/dL (ref 3.5–5.2)
Alkaline Phosphatase: 232 U/L — ABNORMAL HIGH (ref 39–117)
BUN: 24 mg/dL — ABNORMAL HIGH (ref 6–23)
CO2: 23 meq/L (ref 19–32)
Calcium: 9.6 mg/dL (ref 8.4–10.5)
Chloride: 105 meq/L (ref 96–112)
Creatinine, Ser: 2.02 mg/dL — ABNORMAL HIGH (ref 0.40–1.20)
GFR: 24.72 mL/min — ABNORMAL LOW (ref >60.00)
Glucose, Bld: 126 mg/dL — ABNORMAL HIGH (ref 70–99)
Potassium: 4.6 meq/L (ref 3.5–5.1)
Sodium: 137 meq/L (ref 135–145)
Total Bilirubin: 0.3 mg/dL (ref 0.2–1.2)
Total Protein: 8.1 g/dL (ref 6.0–8.3)

## 2024-02-23 LAB — LIPID PANEL
Cholesterol: 114 mg/dL (ref 0–200)
HDL: 48.9 mg/dL (ref >39.00)
LDL Cholesterol: 32 mg/dL (ref 0–99)
NonHDL: 65.19
Total CHOL/HDL Ratio: 2
Triglycerides: 166 mg/dL — ABNORMAL HIGH (ref 0.0–149.0)
VLDL: 33.2 mg/dL (ref 0.0–40.0)

## 2024-02-23 LAB — TSH: TSH: 0.39 u[IU]/mL (ref 0.35–5.50)

## 2024-02-23 MED ORDER — PREGABALIN 50 MG PO CAPS: 50.0000 mg | ORAL_CAPSULE | Freq: Two times a day (BID) | ORAL | 1 refills | Status: DC

## 2024-02-23 MED ORDER — SENNOSIDES 8.6 MG PO TABS: 1.0000 | ORAL_TABLET | Freq: Every day | ORAL | 3 refills | Status: AC

## 2024-02-23 NOTE — Assessment & Plan Note (Signed)
 Pt has app with GI next week If pain worsens --- go to ER Pt is feeling ok now and can eat soft foods

## 2024-02-23 NOTE — Assessment & Plan Note (Signed)
 Senna helps

## 2024-02-23 NOTE — Assessment & Plan Note (Signed)
 Pt is on abx ----omnicef 

## 2024-02-23 NOTE — Assessment & Plan Note (Signed)
 hgba1c to be checked, minimize simple carbs. Increase exercise as tolerated. Continue current meds

## 2024-02-23 NOTE — Progress Notes (Signed)
 Established Patient Office Visit  Subjective   Patient ID: [name], female    DOB: 1956/06/01  Age: 68 y.o. MRN: [id number]  Chief Complaint  Patient presents with   Diabetes   Hypothyroidism   Follow-up    HPI Discussed the use of AI scribe software for clinical note transcription with the patient, who gave verbal consent to proceed.  History of Present Illness [name] is a 68 year old female with a history of bowel blockages and kidney issues who presents with concerns about recent bowel obstruction and urinary symptoms.  She experienced her fourth bowel blockage on July 4th, characterized by severe abdominal pain and vomiting, which provided some relief. She had not eaten breakfast but consumed a full meal after church. She noticed no output from her ostomy bag and felt unwell, prompting her to leave her brother's house early. Upon returning home, she experienced severe pain and took a Senna tablet. She vomited again and felt some relief but was hesitant to visit the emergency department due to past negative experiences and her complex medical history.  She has a history of several reconstructive surgeries and cancer, which makes her cautious about seeking emergency care. She attempted to get an earlier appointment with her doctor but was unsuccessful. Following the blockage, she had a tube exchange at [facility], where she experienced significant rebound tenderness and was unable to eat, only managing sips of liquids. The procedure revealed extremely crusty tubes, and she was advised to return every six weeks instead of eight due to her symptoms.  She reports ongoing urinary issues, including sediment and odor in her urine. Her kidney doctor conducted a culture and prescribed ten days of cefepredoxide, which seems to be alleviating her back pain. She also underwent a CT scan due to persistent pain in her abdomen, particularly after eating, but is still  awaiting results. She has been cautious with her diet, consuming soft foods like scrambled eggs, soup, and rotisserie chicken, and has been drinking protein shakes to maintain nutrition.  She was taken off Mounjaro  by her kidney doctor due to concerns about its effects, particularly after experiencing a bowel blockage while on a higher dose. She is monitoring her blood sugars, which have been affected by her dietary changes and the cessation of Mounjaro . She notes that her blood sugars rise with certain foods like ice cream.  She mentions a recent trip to [place]  with her family, where she had to limit activities due to joint issues. Despite her health challenges, she managed to enjoy the trip, although she experienced back and side pain throughout. She is scheduled to see her doctor on July 31st.   Patient Active Problem List   Diagnosis Date Noted   Slow transit constipation 02/23/2024   S/p small bowel obstruction 02/23/2024   Allergic rhinitis due to allergen 06/01/2023   Stress 04/06/2023   BMI 37.0-37.9, adult 12/30/2022   Colovaginal fistula 12/13/2022   BMI 38.0-38.9,adult 11/25/2022   Abdominal pain, RLQ 10/28/2022   Chronic renal impairment, stage 3b (HCC) 09/30/2022   Presence of urostomy (HCC) 09/04/2022   Serratia 09/04/2022   BMI 40.0-44.9, adult (HCC) 09/02/2022   Obesity, Beginning BMI 38.04 09/02/2022   Dysuria 08/19/2022   Type 2 diabetes mellitus with other specified complication (HCC) 03/22/2022   Type 2 diabetes mellitus with stage 4 chronic kidney disease, without long-term current use of insulin  (HCC) 12/10/2021   Vaginal bleeding 12/10/2021   Vulvar lesion 08/18/2021   DM (  diabetes mellitus) type II, controlled, with peripheral vascular disorder (HCC) 01/20/2021   First degree heart block 01/18/2020   Paroxysmal atrial fibrillation (HCC) 01/18/2020   Swelling of right parotid gland 10/03/2019   Vesicorectal fistula 02/26/2019   Chronic pain    Depression  01/16/2019   Other chronic postprocedural pain 05/25/2018   Recurrent UTI (urinary tract infection) 03/08/2018   HLD (hyperlipidemia) 11/29/2017   GERD (gastroesophageal reflux disease) 11/29/2017   Type II diabetes mellitus with renal manifestations (HCC) 11/29/2017   Preventative health care    Poor venous access    Acute renal failure superimposed on chronic kidney disease (HCC) 10/02/2017   Decubitus ulcer of sacral region, stage 2 (HCC) 10/02/2017   MDD (major depressive disorder), single episode, moderate (HCC)    Poorly controlled diabetes mellitus (HCC) 08/31/2017   Pressure injury of skin 08/31/2017   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 08/31/2017   History of external beam radiation therapy to pelvis 08/31/2017   ARF (acute renal failure) (HCC) 08/30/2017   Adjustment disorder with mixed anxiety and depressed mood 08/30/2017   Dehydration 08/29/2017   CKD (chronic kidney disease), stage III (HCC) 08/24/2017   Wound of right buttock 08/10/2017   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HCC) 06/20/2017   Postoperative anemia 04/04/2017   Pelvic cancer s/p colostomy takedown/loop ileostomy diversion 04/01/2017 04/01/2017   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 04/01/2017   Ureteral stricture, right, 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) 04/01/2017 09/10/2016   Vitamin D  deficiency 08/30/2016   Genetic testing 11/27/2015   Breast cancer of upper-inner quadrant of left female breast (HCC)    Pelvic pain in female 03/19/2015   Chronic anemia 12/30/2014   UTI (urinary tract infection)    Pancytopenia, acquired (HCC) 11/26/2014   Class 2 severe obesity with serious comorbidity and body mass index (BMI) of 38.0 to 38.9 in adult [facility]) 11/20/2014   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 s/p resection/partial vaginectomy/ LAR/colostomy 11/19/2014 11/19/2014   Abdominal pain 09/09/2014   Postmenopausal bleeding 09/05/2014   History of ovarian & endometrial cancer  07/17/2012   Chronic neutropenia (HCC) 12/14/2011   Hypothyroidism 12/14/2011   DM type 2 (diabetes mellitus, type 2) (HCC) 12/14/2011   Benign essential HTN 12/14/2011   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   oncologist-- dr rossi/ dr gorsuch:/   dx endometroid endometrial & ovarian cancer s/p  chemotheapy and surgery(TAH w/ BSO) :  recurrent 11-19-2014 post pelvic surgery and radiation 01-29-2015 to 03-10-2015   Chronic idiopathic neutropenia (HCC)    presumed related to chemotherapy March 2006--- followed by dr lonn (treatment w/ G-CSF injections   Chronic nausea    CKD (chronic kidney disease), stage III [facility])    nephrologist-  dr almarie bonine   Difficult intravenous access    small veins--- hx PICC lines   DM type 2 (diabetes mellitus, type 2) (HCC)    endocriologist--  dr norman sebastian   (08-18-2021  per pt checks blood sugar daily in am,  fasting sugar--92--130)   Dysrhythmia    Environmental and seasonal allergies  Generalized muscle weakness    GERD (gastroesophageal reflux disease)    Hiatal hernia    History of abdominal abscess 04/16/2017   post surgery 04-01-2017  --- resolved 10/ 2018   History of COVID-19 05/2021   per pt mild symptoms that resolved   History of gastric polyp    2014  duodenum   History of ileus 04/16/2017   resolved w/ no surgical intervention   History of radiation therapy    01-29-2015 to 03-10-2015  pelvis 50.4Gy   History of small bowel obstruction 01/2019   Hypothyroidism    monitored by dr ozell altheimer   Ileostomy in place  [facility]) 04/01/2017   created at same time colostomy takedown./  permnant 09/ 2020   Mixed dyslipidemia    Multiple thyroid  nodules    Managed by Dr. Eletha   PAF (paroxysmal atrial fibrillation) [facility])    cardiologist--- dr h. 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   Vitamin D  deficiency    Vulval lesion    Wears glasses    Past Surgical History:  Procedure Laterality Date   APPENDECTOMY     BREAST RECONSTRUCTION WITH PLACEMENT OF TISSUE EXPANDER AND FLEX HD (ACELLULAR HYDRATED DERMIS) Bilateral 12/11/2015   Procedure: BILATERAL BREAST RECONSTRUCTION WITH PLACEMENT OF TISSUE EXPANDERS;  Surgeon: Earlis Ranks, MD;  [facility]: [facility];  Service: Plastics;  Laterality: Bilateral;   COLONOSCOPY WITH PROPOFOL  N/A 08/21/2013   Procedure: COLONOSCOPY WITH PROPOFOL ;  Surgeon: Lamar LULLA Bunk, MD;  [facility]: [facility];  Service: Endoscopy;  Laterality: N/A;   COLOSTOMY TAKEDOWN N/A 12/04/2014   Procedure: LAPROSCOPIC LYSIS OF ADHESIONS, SPLENIC MOBILIZATION, RELOCATION OF COLOSTOMY, DEBRIDEMENT INITIAL COLOSTOMY SITE;  Surgeon: Elspeth Schultze, MD;  [facility]: [facility];  Service: General;  Laterality: N/A;   CYSTECTOMY W/ URETEROILEAL CONDUIT  04/12/2019   @[facility] ;   INTESTINAL ANASTOMOSIS, PANNICULECTOMY, PELVIS EXENTENTION,  PARTIAL COLECTOMY REMOVAL ILEIUM WITH PERMNANT ILEOCOLOSTOMY   CYSTOGRAM N/A 06/01/2017   Procedure: CYSTOGRAM;  Surgeon: Alvaro Hummer, MD;  [facility];  Service: Urology;  Laterality: N/A;   CYSTOSCOPY W/ RETROGRADES Right 11/21/2015   Procedure: CYSTOSCOPY WITH RETROGRADE PYELOGRAM;  Surgeon: Hummer Alvaro, MD;  [facility];   Service: Urology;  Laterality: Right;   CYSTOSCOPY W/ URETERAL STENT PLACEMENT Right 11/21/2015   Procedure: CYSTOSCOPY WITH STENT REPLACEMENT;  Surgeon: Hummer Alvaro, MD;  [facility];  Service: Urology;  Laterality: Right;   CYSTOSCOPY W/ URETERAL STENT PLACEMENT Right 03/10/2016   Procedure: CYSTOSCOPY WITH STENT REPLACEMENT;  Surgeon: Hummer Alvaro, MD;  Location: [facility];  Service: Urology;  Laterality: Right;   CYSTOSCOPY W/ URETERAL STENT PLACEMENT Right 06/30/2016   Procedure: CYSTOSCOPY WITH RETROGRADE PYELOGRAM/URETERAL STENT EXCHANGE;  Surgeon: Hummer Alvaro, MD;  Location[facility];  Service: Urology;  Laterality: Right;   CYSTOSCOPY W/ URETERAL STENT PLACEMENT N/A 06/01/2017   Procedure: CYSTOSCOPY WITH EXAM UNDER ANESTHESIA;  Surgeon: Alvaro Hummer, MD;  [facility];  Service: Urology;  Laterality: N/A;   CYSTOSCOPY W/ URETERAL STENT PLACEMENT Right 08/17/2017   Procedure: CYSTOSCOPY WITH RETROGRADE PYELOGRAM/URETERAL STENT REMOVAL;  Surgeon: Alvaro Hummer, MD;  Location: [facility];  Service: Urology;  Laterality: Right;   CYSTOSCOPY WITH RETROGRADE PYELOGRAM, URETEROSCOPY AND STENT PLACEMENT Right 03/20/2015   Procedure: CYSTOSCOPY WITH RETROGRADE PYELOGRAM, URETEROSCOPY WITH BALLOON DILATION AND STENT PLACEMENT ON RIGHT;  Surgeon: Ricardo Likens, MD;  Location: [facility];  Service: Urology;  Laterality: Right;   CYSTOSCOPY WITH RETROGRADE PYELOGRAM, URETEROSCOPY AND STENT PLACEMENT Right 05/02/2015   Procedure: CYSTOSCOPY WITH RIGHT RETROGRADE PYELOGRAM,  DIAGNOSTIC URETEROSCOPY AND STENT PULL ;  Surgeon: Ricardo Likens, MD;  Location: [facility];  Service: Urology;  Laterality: Right;   CYSTOSCOPY WITH RETROGRADE PYELOGRAM, URETEROSCOPY AND STENT PLACEMENT Right 09/05/2015   Procedure: CYSTOSCOPY WITH RETROGRADE PYELOGRAM,  AND STENT PLACEMENT;  Surgeon: Ricardo Likens, MD;   [facility];  Service: Urology;  Laterality: Right;   CYSTOSCOPY WITH RETROGRADE PYELOGRAM, URETEROSCOPY AND STENT PLACEMENT Right 04/01/2017   Procedure: CYSTOSCOPY WITH RETROGRADE PYELOGRAM, URETEROSCOPY AND STENT PLACEMENT;  Surgeon: Likens Ricardo, MD;  [facility];  Service: Urology;  Laterality: Right;   CYSTOSCOPY WITH STENT PLACEMENT Right 10/27/2016   Procedure: CYSTOSCOPY WITH STENT CHANGE and right retrograde pyelogram;  Surgeon: Ricardo Likens, MD;  Location: [facility];  Service: Urology;  Laterality: Right;   EUS N/A 10/02/2014   Procedure: LOWER ENDOSCOPIC ULTRASOUND (EUS);  Surgeon: Elsie Cree, MD;  Location: [facility];  Service: Endoscopy;  Laterality: N/A;   EXCISION SOFT TISSUE MASS RIGHT FOREMAN  12/08/2006   EYE SURGERY  as child   pytosis of eyelids repair   FOOT SURGERY  10/2023   Left   INCISION AND DRAINAGE OF WOUND Bilateral 12/26/2015   Procedure: DEBRIDEMENT OF BILATERAL MASTECTOMY FLAPS;  Surgeon: Earlis Ranks, MD;  Location: [facility];  Service: Plastics;  Laterality: Bilateral;   IR CV LINE INJECTION  05/31/2017   IR FLUORO GUIDE CV LINE LEFT  05/31/2017   IR FLUORO GUIDE CV LINE RIGHT  04/06/2017   IR FLUORO GUIDE CV MIDLINE PICC RIGHT  05/30/2017   IR NEPHROSTOGRAM LEFT INITIAL PLACEMENT  09/02/2017   IR NEPHROSTOGRAM LEFT THRU EXISTING ACCESS  11/29/2017   IR NEPHROSTOGRAM RIGHT INITIAL PLACEMENT  09/02/2017   IR NEPHROSTOGRAM RIGHT THRU EXISTING ACCESS  09/13/2017   IR NEPHROSTOGRAM RIGHT THRU EXISTING ACCESS  11/29/2017   IR NEPHROSTOMY EXCHANGE LEFT  11/28/2017   IR NEPHROSTOMY EXCHANGE LEFT  01/05/2018   IR NEPHROSTOMY EXCHANGE LEFT  02/16/2018   IR NEPHROSTOMY EXCHANGE LEFT  03/30/2018   IR NEPHROSTOMY EXCHANGE LEFT  05/12/2018   IR NEPHROSTOMY EXCHANGE LEFT  06/21/2018   IR NEPHROSTOMY EXCHANGE LEFT  08/04/2018   IR NEPHROSTOMY EXCHANGE LEFT  09/18/2018   IR NEPHROSTOMY EXCHANGE LEFT   10/09/2018   IR NEPHROSTOMY EXCHANGE LEFT  10/27/2018   IR NEPHROSTOMY EXCHANGE LEFT  11/21/2018   IR NEPHROSTOMY EXCHANGE LEFT  01/05/2019   IR NEPHROSTOMY EXCHANGE LEFT  02/15/2019   IR NEPHROSTOMY EXCHANGE LEFT  03/29/2019   IR NEPHROSTOMY EXCHANGE RIGHT  10/02/2017   IR NEPHROSTOMY EXCHANGE RIGHT  11/28/2017   IR NEPHROSTOMY EXCHANGE RIGHT  01/05/2018   IR NEPHROSTOMY EXCHANGE RIGHT  02/16/2018   IR NEPHROSTOMY EXCHANGE RIGHT  03/30/2018   IR NEPHROSTOMY EXCHANGE RIGHT  05/12/2018   IR NEPHROSTOMY EXCHANGE RIGHT  06/21/2018   IR NEPHROSTOMY EXCHANGE RIGHT  08/04/2018   IR NEPHROSTOMY EXCHANGE RIGHT  09/18/2018   IR NEPHROSTOMY EXCHANGE RIGHT  10/27/2018   IR NEPHROSTOMY EXCHANGE RIGHT  11/21/2018   IR NEPHROSTOMY EXCHANGE RIGHT  01/05/2019   IR NEPHROSTOMY EXCHANGE RIGHT  02/15/2019   IR NEPHROSTOMY EXCHANGE RIGHT  03/29/2019   IR NEPHROSTOMY PLACEMENT LEFT  10/02/2017   IR RADIOLOGIST EVAL & MGMT  05/03/2017   IR US  GUIDE VASC ACCESS LEFT  05/31/2017   IR US  GUIDE VASC ACCESS RIGHT  04/06/2017   IR US  GUIDE VASC ACCESS RIGHT  05/30/2017   LAPAROSCOPIC CHOLECYSTECTOMY  1990   LIPOSUCTION WITH LIPOFILLING Bilateral 04/16/2016   Procedure: LIPOSUCTION WITH LIPOFILLING TO BILATERAL CHEST;  Surgeon: Earlis Ranks, MD;  Location: [facility];  Service: Plastics;  Laterality: Bilateral;   MASTECTOMY W/ SENTINEL NODE BIOPSY Bilateral 12/11/2015   Procedure: RIGHT PROPHYLACTIC MASTECTOMY, LEFT TOTAL MASTECTOMY WITH LEFT AXILLARY SENTINEL LYMPH NODE BIOPSY;  Surgeon: Jina Nephew, MD;  [facility];  Service: General;  Laterality: Bilateral;   OSTOMY N/A 11/19/2014   Procedure: OSTOMY;  Surgeon: Elspeth Schultze, MD;  [facility];  Service: General;  Laterality: N/A;   PROCTOSCOPY N/A 04/01/2017   Procedure: RIDGE PROCTOSCOPY;  Surgeon: Schultze Elspeth, MD;  [facility];  Service: General;  Laterality: N/A;   REMOVAL OF BILATERAL TISSUE EXPANDERS WITH PLACEMENT OF  BILATERAL BREAST IMPLANTS Bilateral 04/16/2016   Procedure: REMOVAL OF BILATERAL TISSUE EXPANDERS WITH PLACEMENT OF BILATERAL BREAST IMPLANTS;  Surgeon: Earlis Ranks, MD;  Location: [facility];  Service: Plastics;  Laterality: Bilateral;   ROBOTIC ASSISTED LAP VAGINAL HYSTERECTOMY N/A 11/19/2014   Procedure: ROBOTIC LYSIS OF ADHESIONS, CONVERTED TO LAPAROTOMY RADICAL UPPER VAGINECTOMY,LOW ANTERIOR BOWEL RESECTION, COLOSTOMY, BILATERAL URETERAL STENT PLACEMENT AND CYSTONOMY CLOSURE;  Surgeon: Maurilio Ship, MD;  [facility];  Service: Gynecology;  Laterality: N/A;   TISSUE EXPANDER FILLING Bilateral 12/26/2015   Procedure: EXPANSION OF BILATERAL CHEST TISSUE EXPANDERS (60 mL- Right; 75 mL- Left);  Surgeon: Earlis Ranks, MD;  Location: [facility];  Service: Plastics;  Laterality: Bilateral;   TONSILLECTOMY     TOTAL ABDOMINAL HYSTERECTOMY  March 2006   [facility]   and Bilateral Salpingoophorectomy/  staging for Ovarian cancer/  an   VULVAR LESION REMOVAL N/A 08/19/2021   Procedure: VULVAR LESION with biopsies;  Surgeon: Viktoria Comer SAUNDERS, MD;  Location: [facility];  Service: Gynecology;  Laterality: N/A;   XI ROBOTIC ASSISTED LOWER ANTERIOR RESECTION N/A 04/01/2017   Procedure: XI ROBOTIC VS LAPAROSCOPIC COLOSTOMY TAKEDOWN WITH LYSIS OF ADHESIONS.;  Surgeon: Schultze Elspeth, MD;  [facility];  Service: General;  Laterality: N/A;  ERAS PATHWAY   Social History   Tobacco Use   Smoking status: Never    Passive exposure: Never   Smokeless tobacco: Never  Vaping Use   Vaping status: Never Used  Substance Use Topics   Alcohol  use: Yes    Comment: Occ   Drug use: No   Social History   Socioeconomic History   Marital status: Married    Spouse name: Clinical research associate   Number of children: 1   Years of education: Not on file   Highest education level: Master's degree (e.g., MA, MS, MEng, MEd, MSW, MBA)  Occupational History   Occupation:  retired Charity fundraiser from [employer]    Comment: L&D Charity fundraiser - retired  Tobacco Use   Smoking status: Never    Passive exposure: Never   Smokeless tobacco: Never  Vaping Use   Vaping status: Never Used  Substance and Sexual Activity   Alcohol  use: Yes    Comment: Occ   Drug use: No   Sexual activity: Not Currently  Other Topics Concern   Not on file  Social History Narrative   Exercise-- has not gotten back into it since cancer came back   Social Drivers of Health   Financial Resource Strain: Low Risk  (02/16/2024)   Overall Financial Resource Strain (CARDIA)    Difficulty of Paying Living Expenses: Not very hard  Food Insecurity: No Food Insecurity (02/16/2024)   Hunger Vital Sign    Worried About Running Out of Food in the Last Year: Never true    Ran Out of Food in the Last Year: Never true  Transportation Needs: No Transportation Needs (02/16/2024)   PRAPARE - Administrator, Civil Service (Medical): No    Lack of Transportation (Non-Medical): No  Physical Activity: Insufficiently Active (02/16/2024)   Exercise Vital Sign    Days of Exercise per Week: 1 day    Minutes of Exercise per Session: 30 min  Stress: No Stress Concern Present (02/16/2024)   [employer] of Occupational Health - Occupational Stress Questionnaire    Feeling of Stress: Not at all  Social Connections: Socially Integrated (02/16/2024)   Social Connection and Isolation Panel    Frequency of Communication with Friends and Family: More than three times a week    Frequency of Social Gatherings with Friends and Family: Three times a week    Attends Religious Services: More than 4 times per year    Active Member of Clubs or Organizations: Yes    Attends Engineer, structural: More than 4 times per year    Marital Status: Married  Catering manager Violence: Not on file   Family Status  Relation Name Status   Mother  Deceased   Father  Deceased   Mat Aunt  Deceased   Pat Aunt  Deceased   MGM   Deceased   MGF  Deceased   PGM  Deceased   PGF  Deceased   Mat Aunt  Deceased   Other  Deceased   Sister  (Not Specified)   Brother  (Not Specified)  No partnership data on file   Family History  Problem Relation Age of Onset   Cancer Mother 30       stomach ca   Hypertension Mother    Cancer Father 42       prostate ca   Diabetes Father    Heart disease Father        CABG   Hypertension Father    Hyperlipidemia Father    Obesity Father    Breast cancer Maternal Aunt        dx in her 27s   Lymphoma Paternal Aunt    Brain cancer Paternal Grandfather    Ovarian cancer Other    Diabetes Sister    Hypertension Brother y-10   Heart disease Brother        CABG   Diabetes Brother    Allergies  Allergen Reactions   Penicillins Hives and Swelling    Facial swelling/childhood allergy Has patient had a PCN reaction causing immediate rash, facial/tongue/throat swelling, SOB or lightheadedness with hypotension: Yes Has patient had a PCN reaction causing severe rash involving mucus membranes or skin necrosis: Yes Has patient had a PCN reaction that required hospitalization yes Has patient had a PCN reaction occurring within the last 10 years: No If all of the above answers are NO, then may proceed with Cephalosporin use.    Erythromycin Other (See Comments)    Gastritis, abd cramps   Tape Rash  blisters   Trimethoprim Rash   Ultram  [Tramadol ] Hives   Zarxio  [Filgrastim ] Other (See Comments)    Post injection, elevated heart rate, body feeling unwell, uneasy.    Cephalosporins Rash   Fluconazole  Rash   Neomycin  Rash    blisters   Oxycodone  Other (See Comments)     I just feel weird   Pectin Rash    Pectin ring for stoma   Septra [Sulfamethoxazole-Trimethoprim] Rash   Sulfa Antibiotics Rash      Review of Systems  Constitutional:  Negative for fever and malaise/fatigue.  HENT:  Negative for congestion.   Eyes:  Negative for blurred vision.  Respiratory:   Negative for shortness of breath.   Cardiovascular:  Negative for chest pain, palpitations and leg swelling.  Gastrointestinal:  Negative for abdominal pain, blood in stool and nausea.  Genitourinary:  Negative for dysuria and frequency.  Musculoskeletal:  Negative for falls.  Skin:  Negative for rash.  Neurological:  Negative for dizziness, loss of consciousness and headaches.  Endo/Heme/Allergies:  Negative for environmental allergies.  Psychiatric/Behavioral:  Negative for depression. The patient is not nervous/anxious.       Objective:     BP 112/76 (BP Location: Left Arm, Patient Position: Sitting, Cuff Size: Large)   Pulse 81   Temp 97.6 F (36.4 C) (Oral)   Resp 18   Ht 5' 1 (1.549 m)   Wt 185 lb 12.8 oz (84.3 kg)   SpO2 98%   BMI 35.11 kg/m  BP Readings from Last 3 Encounters:  02/23/24 112/76  02/16/24 107/69  12/27/23 118/70   Wt Readings from Last 3 Encounters:  02/23/24 185 lb 12.8 oz (84.3 kg)  02/16/24 183 lb (83 kg)  12/27/23 195 lb 9.6 oz (88.7 kg)   SpO2 Readings from Last 3 Encounters:  02/23/24 98%  02/16/24 100%  12/27/23 96%      Physical Exam Vitals and nursing note reviewed.  Constitutional:      General: She is not in acute distress.    Appearance: Normal appearance. She is well-developed.  HENT:     Head: Normocephalic and atraumatic.  Eyes:     General: No scleral icterus.       Right eye: No discharge.        Left eye: No discharge.  Cardiovascular:     Rate and Rhythm: Normal rate and regular rhythm.     Heart sounds: No murmur heard. Pulmonary:     Effort: Pulmonary effort is normal. No respiratory distress.     Breath sounds: Normal breath sounds.  Musculoskeletal:        General: Normal range of motion.     Cervical back: Normal range of motion and neck supple.     Right lower leg: No edema.     Left lower leg: No edema.  Skin:    General: Skin is warm and dry.  Neurological:     Mental Status: She is alert and  oriented to person, place, and time.  Psychiatric:        Mood and Affect: Mood normal.        Behavior: Behavior normal.        Thought Content: Thought content normal.        Judgment: Judgment normal.      No results found for any visits on 02/23/24.  Last CBC Lab Results  Component Value Date   WBC 9.7 10/06/2023   HGB 12.5 10/06/2023   HCT 40.1 10/06/2023  MCV 95.0 10/06/2023   MCH 29.6 10/06/2023   RDW 14.6 10/06/2023   PLT 257 10/06/2023   Last metabolic panel Lab Results  Component Value Date   GLUCOSE 132 (H) 08/22/2023   NA 138 08/22/2023   K 4.3 08/22/2023   CL 102 08/22/2023   CO2 24 08/22/2023   BUN 31 (H) 08/22/2023   CREATININE 1.73 (H) 08/22/2023   GFR 29.88 (L) 08/22/2023   CALCIUM  9.5 08/22/2023   PHOS 3.4 01/22/2019   PROT 7.5 08/22/2023   ALBUMIN  3.9 08/22/2023   LABGLOB 3.4 06/16/2020   AGRATIO 1.3 06/16/2020   BILITOT 0.4 08/22/2023   ALKPHOS 159 (H) 08/22/2023   AST 18 08/22/2023   ALT 24 08/22/2023   ANIONGAP 6 06/04/2022   Last lipids Lab Results  Component Value Date   CHOL 77 02/17/2023   HDL 39.20 02/17/2023   LDLCALC 14 02/17/2023   TRIG 118.0 02/17/2023   CHOLHDL 2 02/17/2023   Last hemoglobin A1c Lab Results  Component Value Date   HGBA1C 6.4 08/22/2023   Last thyroid  functions Lab Results  Component Value Date   TSH 1.23 08/22/2023   Last vitamin D  Lab Results  Component Value Date   VD25OH 44.37 08/22/2023   Last vitamin B12 and Folate Lab Results  Component Value Date   VITAMINB12 950 (H) 02/15/2022   FOLATE >20.0 04/05/2018      The ASCVD Risk score (Arnett DK, et al., 2019) failed to calculate for the following reasons:   The valid total cholesterol range is 130 to 320 mg/dL    Assessment & Plan:   Problem List Items Addressed This Visit       Unprioritized   Vitamin D  deficiency   Paroxysmal atrial fibrillation (HCC)   Type 2 diabetes mellitus with other specified complication (HCC) -  Primary   Relevant Orders   CBC with Differential/Platelet   Comprehensive metabolic panel with GFR   Hemoglobin A1c   Hypothyroidism   Relevant Orders   TSH   HLD (hyperlipidemia)   Relevant Orders   Lipid panel   Comprehensive metabolic panel with GFR   Chronic pain   Relevant Medications   pregabalin  (LYRICA ) 50 MG capsule   Chronic renal impairment, stage 3b (HCC)   UTI (urinary tract infection)   Pt is on abx ----omnicef       Type 2 diabetes mellitus with stage 4 chronic kidney disease, without long-term current use of insulin  (HCC)   hgba1c to be checked , minimize simple carbs. Increase exercise as tolerated. Continue current meds       Slow transit constipation   Senna helps       Relevant Medications   senna (SENOKOT) 8.6 MG tablet   S/p small bowel obstruction   Pt has app with GI next week If pain worsens --- go to ER Pt is feeling ok now and can eat soft foods      Assessment and Plan Assessment & Plan Bowel Obstruction   Recurrent bowel obstructions, with the latest episode on July 4th, cause severe pain, nausea, and vomiting. 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 She prefers home management due to past negative emergency care experiences and is awaiting a specialist follow-up on July 31st. Prescribe Senokot and advise dietary modifications, including soft foods and protein shakes, to prevent future obstructions.  Urinary Tract Infection (UTI)   A recent UTI presented with back pain and foul-smelling urine. A 10-day course of cefpodoxime , prescribed by her nephrologist,  has alleviated back pain. A CT scan was ordered to investigate persistent abdominal pain and tenderness. Awaiting results, with instructions to expedite reading if symptoms worsen.  Chronic Kidney Disease (CKD)   Chronic kidney disease with a GFR of 26 and creatinine levels previously reaching 2.2. She is under nephrology care and advised to avoid dehydration to  prevent worsening kidney function. Monitor kidney function and attend the nephrology appointment on August 21st.  Diabetes Mellitus   Diabetes management is complicated by the recent discontinuation of Mounjaro  due to renal concerns. Blood glucose levels are well-managed at 131 mg/dL without recent food intake. She is aware of dietary impacts on blood sugar and is adjusting her diet accordingly. Monitor blood glucose levels and adjust diet to maintain blood sugar levels.    Return in about 6 months (around 08/25/2024), or if symptoms worsen or fail to improve, for annual exam, fasting.    Detrice Cales R Lowne Chase, DO

## 2024-02-23 NOTE — Patient Instructions (Signed)
 Carbohydrate Counting for Diabetes Mellitus, Adult Carbohydrate counting is a method of keeping track of how many carbohydrates you eat. Eating carbohydrates increases the amount of sugar (glucose) in the blood. Counting how many carbohydrates you eat improves how well you manage your blood glucose. This, in turn, helps you manage your diabetes. Carbohydrates are measured in grams (g) per serving. It is important to know how many carbohydrates (in grams or by serving size) you can have in each meal. This is different for every person. A dietitian can help you make a meal plan and calculate how many carbohydrates you should have at each meal and snack. What foods contain carbohydrates? Carbohydrates are found in the following foods: Grains, such as breads and cereals. Dried beans and soy products. Starchy vegetables, such as potatoes, peas, and corn. Fruit and fruit juices. Milk and yogurt. Sweets and snack foods, such as cake, cookies, candy, chips, and soft drinks. How do I count carbohydrates in foods? There are two ways to count carbohydrates in food. You can read food labels or learn standard serving sizes of foods. You can use either of these methods or a combination of both. Using the Nutrition Facts label The Nutrition Facts list is included on the labels of almost all packaged foods and beverages in the [place]. It includes: The serving size. Information about nutrients in each serving, including the grams of carbohydrate per serving. To use the Nutrition Facts, decide how many servings you will have. Then, multiply the number of servings by the number of carbohydrates per serving. The resulting number is the total grams of carbohydrates that you will be having. Learning the standard serving sizes of foods When you eat carbohydrate foods that are not packaged or do not include Nutrition Facts on the label, you need to measure the servings in order to count the grams of  carbohydrates. Measure the foods that you will eat with a food scale or measuring cup, if needed. Decide how many standard-size servings you will eat. Multiply the number of servings by 15. For foods that contain carbohydrates, one serving equals 15 g of carbohydrates. For example, if you eat 2 cups or 10 oz (300 g) of strawberries, you will have eaten 2 servings and 30 g of carbohydrates (2 servings x 15 g = 30 g). For foods that have more than one food mixed, such as soups and casseroles, you must count the carbohydrates in each food that is included. The following list contains standard serving sizes of common carbohydrate-rich foods. Each of these servings has about 15 g of carbohydrates: 1 slice of bread. 1 six-inch (15 cm) tortilla. ? cup or 2 oz (53 g) cooked rice or pasta.  cup or 3 oz (85 g) cooked or canned, drained and rinsed beans or lentils.  cup or 3 oz (85 g) starchy vegetable, such as peas, corn, or squash.  cup or 4 oz (120 g) hot cereal.  cup or 3 oz (85 g) boiled or mashed potatoes, or  or 3 oz (85 g) of a large baked potato.  cup or 4 fl oz (118 mL) fruit juice. 1 cup or 8 fl oz (237 mL) milk. 1 small or 4 oz (106 g) apple.  or 2 oz (63 g) of a medium banana. 1 cup or 5 oz (150 g) strawberries. 3 cups or 1 oz (28.3 g) popped popcorn. What is an example of carbohydrate counting? To calculate the grams of carbohydrates in this sample meal, follow the steps  shown below. Sample meal 3 oz (85 g) chicken breast. ? cup or 4 oz (106 g) brown rice.  cup or 3 oz (85 g) corn. 1 cup or 8 fl oz (237 mL) milk. 1 cup or 5 oz (150 g) strawberries with sugar-free whipped topping. Carbohydrate calculation Identify the foods that contain carbohydrates: Rice. Corn. Milk. Strawberries. Calculate how many servings you have of each food: 2 servings rice. 1 serving corn. 1 serving milk. 1 serving strawberries. Multiply each number of servings by 15 g: 2 servings rice x 15  g = 30 g. 1 serving corn x 15 g = 15 g. 1 serving milk x 15 g = 15 g. 1 serving strawberries x 15 g = 15 g. Add together all of the amounts to find the total grams of carbohydrates eaten: 30 g + 15 g + 15 g + 15 g = 75 g of carbohydrates total. What are tips for following this plan? Shopping Develop a meal plan and then make a shopping list. Buy fresh and frozen vegetables, fresh and frozen fruit, dairy, eggs, beans, lentils, and whole grains. Look at food labels. Choose foods that have more fiber and less sugar. Avoid processed foods and foods with added sugars. Meal planning Aim to have the same number of grams of carbohydrates at each meal and for each snack time. Plan to have regular, balanced meals and snacks. Where to find more information American Diabetes Association: diabetes.org Centers for Disease Control and Prevention: TonerPromos.no Academy of Nutrition and Dietetics: eatright.org Association of Diabetes Care & Education Specialists: diabeteseducator.org Summary Carbohydrate counting is a method of keeping track of how many carbohydrates you eat. Eating carbohydrates increases the amount of sugar (glucose) in your blood. Counting how many carbohydrates you eat improves how well you manage your blood glucose. This helps you manage your diabetes. A dietitian can help you make a meal plan and calculate how many carbohydrates you should have at each meal and snack. This information is not intended to replace advice given to you by your health care provider. Make sure you discuss any questions you have with your health care provider. Document Revised: 02/19/2020 Document Reviewed: 02/20/2020 Elsevier Patient Education  2024 [employer].

## 2024-02-26 ENCOUNTER — Ambulatory Visit: Payer: Self-pay | Admitting: Family Medicine

## 2024-02-26 DIAGNOSIS — N289 Disorder of kidney and ureter, unspecified: Secondary | ICD-10-CM

## 2024-03-01 DIAGNOSIS — K6389 Other specified diseases of intestine: Secondary | ICD-10-CM | POA: Diagnosis not present

## 2024-03-01 DIAGNOSIS — Z8719 Personal history of other diseases of the digestive system: Secondary | ICD-10-CM | POA: Diagnosis not present

## 2024-03-01 DIAGNOSIS — Z933 Colostomy status: Secondary | ICD-10-CM | POA: Diagnosis not present

## 2024-03-01 DIAGNOSIS — Z860100 Personal history of colon polyps, unspecified: Secondary | ICD-10-CM | POA: Diagnosis not present

## 2024-03-22 DIAGNOSIS — Z436 Encounter for attention to other artificial openings of urinary tract: Secondary | ICD-10-CM | POA: Diagnosis not present

## 2024-03-22 DIAGNOSIS — N184 Chronic kidney disease, stage 4 (severe): Secondary | ICD-10-CM | POA: Diagnosis not present

## 2024-03-22 DIAGNOSIS — N131 Hydronephrosis with ureteral stricture, not elsewhere classified: Secondary | ICD-10-CM | POA: Diagnosis not present

## 2024-03-22 DIAGNOSIS — N135 Crossing vessel and stricture of ureter without hydronephrosis: Secondary | ICD-10-CM | POA: Diagnosis not present

## 2024-03-27 DIAGNOSIS — E785 Hyperlipidemia, unspecified: Secondary | ICD-10-CM | POA: Diagnosis not present

## 2024-03-27 DIAGNOSIS — N1832 Chronic kidney disease, stage 3b: Secondary | ICD-10-CM | POA: Diagnosis not present

## 2024-03-27 DIAGNOSIS — E1122 Type 2 diabetes mellitus with diabetic chronic kidney disease: Secondary | ICD-10-CM | POA: Diagnosis not present

## 2024-03-27 DIAGNOSIS — I129 Hypertensive chronic kidney disease with stage 1 through stage 4 chronic kidney disease, or unspecified chronic kidney disease: Secondary | ICD-10-CM | POA: Diagnosis not present

## 2024-03-29 ENCOUNTER — Ambulatory Visit (INDEPENDENT_AMBULATORY_CARE_PROVIDER_SITE_OTHER): Admitting: Family Medicine

## 2024-04-27 ENCOUNTER — Other Ambulatory Visit: Payer: Self-pay | Admitting: Hematology and Oncology

## 2024-05-03 DIAGNOSIS — Z436 Encounter for attention to other artificial openings of urinary tract: Secondary | ICD-10-CM | POA: Diagnosis not present

## 2024-05-03 DIAGNOSIS — N131 Hydronephrosis with ureteral stricture, not elsewhere classified: Secondary | ICD-10-CM | POA: Diagnosis not present

## 2024-05-10 ENCOUNTER — Ambulatory Visit (INDEPENDENT_AMBULATORY_CARE_PROVIDER_SITE_OTHER): Admitting: Family Medicine

## 2024-05-10 ENCOUNTER — Encounter (INDEPENDENT_AMBULATORY_CARE_PROVIDER_SITE_OTHER): Payer: Self-pay | Admitting: Family Medicine

## 2024-05-10 VITALS — BP 131/81 | HR 61 | Temp 97.9°F | Ht 61.0 in | Wt 189.0 lb

## 2024-05-10 DIAGNOSIS — J3089 Other allergic rhinitis: Secondary | ICD-10-CM | POA: Diagnosis not present

## 2024-05-10 DIAGNOSIS — Z6835 Body mass index (BMI) 35.0-35.9, adult: Secondary | ICD-10-CM

## 2024-05-10 DIAGNOSIS — E559 Vitamin D deficiency, unspecified: Secondary | ICD-10-CM

## 2024-05-10 DIAGNOSIS — F3289 Other specified depressive episodes: Secondary | ICD-10-CM

## 2024-05-10 DIAGNOSIS — E669 Obesity, unspecified: Secondary | ICD-10-CM

## 2024-05-10 DIAGNOSIS — E66812 Obesity, class 2: Secondary | ICD-10-CM

## 2024-05-10 DIAGNOSIS — E1122 Type 2 diabetes mellitus with diabetic chronic kidney disease: Secondary | ICD-10-CM

## 2024-05-10 DIAGNOSIS — E119 Type 2 diabetes mellitus without complications: Secondary | ICD-10-CM | POA: Diagnosis not present

## 2024-05-10 DIAGNOSIS — F5089 Other specified eating disorder: Secondary | ICD-10-CM

## 2024-05-10 DIAGNOSIS — Z7985 Long-term (current) use of injectable non-insulin antidiabetic drugs: Secondary | ICD-10-CM

## 2024-05-10 DIAGNOSIS — F32A Depression, unspecified: Secondary | ICD-10-CM

## 2024-05-10 MED ORDER — FLUTICASONE PROPIONATE 50 MCG/ACT NA SUSP
2.0000 | Freq: Every day | NASAL | 3 refills | Status: AC
Start: 2024-05-10 — End: ?

## 2024-05-10 MED ORDER — BUPROPION HCL 100 MG PO TABS: 100.0000 mg | ORAL_TABLET | Freq: Two times a day (BID) | ORAL | 0 refills | Status: DC

## 2024-05-10 MED ORDER — MOUNJARO 2.5 MG/0.5ML ~~LOC~~ SOAJ: 2.5000 mg | SUBCUTANEOUS | 0 refills | Status: DC

## 2024-05-10 NOTE — Progress Notes (Signed)
 Office: [phone]  /  Fax: [phone]  WEIGHT SUMMARY AND BIOMETRICS  Anthropometric Measurements Height: 5' 1 (1.549 m) Weight: 189 lb (85.7 kg) BMI (Calculated): 35.73 Weight at Last Visit: 183 lb Weight Lost Since Last Visit: 0 Weight Gained Since Last Visit: 6 Starting Weight: 208 lb Total Weight Loss (lbs): 19 lb (8.618 kg)   Body Composition  Body Fat %: 47.3 % Fat Mass (lbs): 89.4 lbs Muscle Mass (lbs): 94.6 lbs Total Body Water  (lbs): 70.8 lbs Visceral Fat Rating : 15   Other Clinical Data Today's Visit #: 18 Starting Date: 04/05/18    Chief Complaint: OBESITY   Discussed the use of AI scribe software for clinical note transcription with the patient, who gave verbal consent to proceed.  History of Present Illness [name] is a 68 year old female with obesity and type 2 diabetes who presents for obesity treatment plan assessment and progress evaluation.  She has been attempting to follow a diet plan of 1300 calories with 60 to 80 grams of protein, achieving this about 60% of the time. She engages in chair yoga and walks twice a week. Despite these efforts, she has gained six pounds over the last three months.  She has type 2 diabetes and has been focusing on diet, exercise, and weight loss to manage her condition. She previously attempted to take Mounjaro  at a low dose of 2.5 mg for her diabetes but was removed from it due to a likely contribution to a small bowel obstruction. After discontinuing Mounjaro , her blood sugars increased significantly, often reaching the 200s after meals. She has since resumed Mounjaro  at 2.5 mg, prescribed by her nephrologist, and is experiencing some improvement in her blood sugar levels, though they are not yet at her desired range.  She has experienced small bowel obstructions, with the last one being particularly severe. She has identified certain foods, such as rice and large portions of meat, as potential  triggers for her symptoms. She is cautious about her diet, ensuring she drinks enough fluids and chews her food well to prevent further obstructions.  Her current medications include Mounjaro  2.5 mg, Flonase , and Wellbutrin . She also takes a high dose of vitamin D , 10,000 units daily, due to poor absorption, and her levels are monitored regularly.  She reports sinus drainage related to allergies and has not yet started using Flonase  regularly this fall.      PHYSICAL EXAM:  Blood pressure 131/81, pulse 61, temperature 97.9 F (36.6 C), height 5' 1 (1.549 m), weight 189 lb (85.7 kg), SpO2 100%. Body mass index is 35.71 kg/m.  DIAGNOSTIC DATA REVIEWED:  BMET    Component Value Date/Time   NA 137 02/23/2024 0941   NA 141 01/11/2022 1544   NA 141 01/24/2017 1228   K 4.6 02/23/2024 0941   K 4.6 01/24/2017 1228   CL 105 02/23/2024 0941   CO2 23 02/23/2024 0941   CO2 29 01/24/2017 1228   GLUCOSE 126 (H) 02/23/2024 0941   GLUCOSE 84 01/24/2017 1228   BUN 24 (H) 02/23/2024 0941   BUN 29 (H) 01/11/2022 1544   BUN 22.2 01/24/2017 1228   CREATININE 2.02 (H) 02/23/2024 0941   CREATININE 2.20 (H) 06/04/2022 1450   CREATININE 1.91 (H) 01/18/2020 1440   CREATININE 0.8 01/24/2017 1228   CALCIUM  9.6 02/23/2024 0941   CALCIUM  10.2 01/24/2017 1228   GFRNONAA 24 (L) 06/04/2022 1450   GFRNONAA 78 12/16/2014 1530   GFRAA 38 (L) 06/16/2020 9165  GFRAA 30 (L) 03/27/2018 1324   GFRAA >89 12/16/2014 1530   Lab Results  Component Value Date   HGBA1C 6.8 (H) 02/23/2024   HGBA1C 6.3 (H) 11/07/2014   Lab Results  Component Value Date   INSULIN  14.1 06/16/2020   Lab Results  Component Value Date   TSH 0.39 02/23/2024   CBC    Component Value Date/Time   WBC 5.5 02/23/2024 0941   RBC 4.37 02/23/2024 0941   HGB 12.6 02/23/2024 0941   HGB 11.3 (L) 06/04/2022 1450   HGB 11.5 01/11/2022 1544   HGB 12.4 07/29/2017 1444   HCT 38.6 02/23/2024 0941   HCT 35.5 01/11/2022 1544   HCT 38.0  07/29/2017 1444   PLT 287.0 02/23/2024 0941   PLT 184 06/04/2022 1450   PLT 207 01/11/2022 1544   MCV 88.4 02/23/2024 0941   MCV 94 01/11/2022 1544   MCV 90.9 07/29/2017 1444   MCH 29.6 10/06/2023 1112   MCHC 32.7 02/23/2024 0941   RDW 15.9 (H) 02/23/2024 0941   RDW 13.1 01/11/2022 1544   RDW 15.5 (H) 07/29/2017 1444   Iron  Studies    Component Value Date/Time   IRON  7 (L) 08/31/2017 0451   IRON  12 (L) 11/29/2014 1251   TIBC 164 (L) 08/31/2017 0451   TIBC 331 11/29/2014 1251   FERRITIN 27 11/29/2014 1251   IRONPCTSAT 4 (L) 08/31/2017 0451   IRONPCTSAT 4 (L) 11/29/2014 1251   Lipid Panel     Component Value Date/Time   CHOL 114 02/23/2024 0941   CHOL 92 (L) 06/16/2020 0834   TRIG 166.0 (H) 02/23/2024 0941   HDL 48.90 02/23/2024 0941   HDL 49 06/16/2020 0834   CHOLHDL 2 02/23/2024 0941   VLDL 33.2 02/23/2024 0941   LDLCALC 32 02/23/2024 0941   LDLCALC 21 06/16/2020 0834   LDLCALC 13 01/18/2020 1440   Hepatic Function Panel     Component Value Date/Time   PROT 8.1 02/23/2024 0941   PROT 7.8 06/16/2020 0834   PROT 6.9 01/24/2017 1228   ALBUMIN  3.9 02/23/2024 0941   ALBUMIN  4.4 06/16/2020 0834   ALBUMIN  3.4 (L) 01/24/2017 1228   AST 15 02/23/2024 0941   AST 12 (L) 06/04/2022 1450   AST 16 01/24/2017 1228   ALT 33 02/23/2024 0941   ALT 14 06/04/2022 1450   ALT 14 01/24/2017 1228   ALKPHOS 232 (H) 02/23/2024 0941   ALKPHOS 90 01/24/2017 1228   BILITOT 0.3 02/23/2024 0941   BILITOT 0.6 06/04/2022 1450   BILITOT 0.34 01/24/2017 1228      Component Value Date/Time   TSH 0.39 02/23/2024 0941   Nutritional Lab Results  Component Value Date   VD25OH 44.37 08/22/2023   VD25OH 36.40 02/17/2023   VD25OH 31.09 02/15/2022     Assessment and Plan Assessment & Plan Obesity and weight management She has gained six pounds in the last three months. Adherence to a 1300-calorie diet with 60-80 grams of protein is about 60%. Physical activity includes chair yoga and  walking twice a week. Mounjaro  is used for weight loss, but dietary habits, particularly simple carbohydrate consumption, affect its efficacy. - Encourage adherence to dietary plan with focus on reducing simple carbohydrates. - Provide protein pudding recipe to increase protein intake. - Advise eating small, frequent meals to prevent hunger and nausea.  Type 2 diabetes mellitus Blood sugar levels often exceed 200 mg/dL postprandially. Mounjaro  is used for glycemic control. She understands the impact of high carbohydrate meals on blood  sugar levels. She has a history of small bowel obstruction, which may be related to Mounjaro . Aware of recurrence risk due to GLP-1 agonists slowing the GI tract. Precautions include hydration and avoiding trigger foods like rice and large meat portions. Informed consent and risk versus benefits from [place] obtained for continued Mounjaro  use at a low dose. - Continue Mounjaro  2.5 mg for blood sugar control with caution. - Advise regular blood sugar monitoring. - Encourage dietary modifications to reduce carbohydrate intake, especially rice and high sugar fruits like pineapple. - Discussed the importance of protein intake with meals to stabilize blood sugar levels. - Monitor for signs of bowel obstruction and report symptoms immediately. - Encourage hydration and mindful eating habits to prevent obstruction.  Vitamin D  deficiency She is on 10,000 units of vitamin D  daily due to poor absorption. Concern exists for potential toxicity, but levels are monitored. - Order vitamin D  level to monitor for potential toxicity. - Aim for vitamin D  levels between 50-60 ng/mL, with adjustments if levels exceed 80 ng/mL.  Allergic rhinitis She experiences sinus drainage and has not been using Flonase  regularly. Symptoms are manageable but could improve with consistent medication use. - Prescribe Flonase  and encourage regular use to manage symptoms.  Depression with Emotional  Eating Behaviors Well-managed on Wellbutrin  with no reported issues. - Continue Wellbutrin , prescribe 90-day supply.     I personally spent a total of 43 minutes in the care of the patient today including preparing to see the patient, reviewing separately obtained history, performing a medically appropriate evaluation of current problems, placing orders in the EMR, documenting clinical information in the EMR, customized nutritional counseling for their specific health and social needs, and discussing results with the patient and educating them on how these results can affect their health and weight.    [name] was informed of the importance of frequent follow up visits to maximize her success with intensive lifestyle modifications for her obesity and obesity related health conditions as recommended by USPSTF and CMS guidelines   Louann Penton, MD

## 2024-05-11 ENCOUNTER — Ambulatory Visit: Admitting: Gynecologic Oncology

## 2024-05-11 LAB — VITAMIN D 25 HYDROXY (VIT D DEFICIENCY, FRACTURES): Vit D, 25-Hydroxy: 47.1 ng/mL (ref 30.0–100.0)

## 2024-05-24 DIAGNOSIS — L433 Subacute (active) lichen planus: Secondary | ICD-10-CM | POA: Diagnosis not present

## 2024-05-24 DIAGNOSIS — L738 Other specified follicular disorders: Secondary | ICD-10-CM | POA: Diagnosis not present

## 2024-05-24 DIAGNOSIS — Z85828 Personal history of other malignant neoplasm of skin: Secondary | ICD-10-CM | POA: Diagnosis not present

## 2024-05-24 DIAGNOSIS — L570 Actinic keratosis: Secondary | ICD-10-CM | POA: Diagnosis not present

## 2024-05-25 ENCOUNTER — Inpatient Hospital Stay: Payer: Self-pay | Attending: Gynecologic Oncology | Admitting: Gynecologic Oncology

## 2024-05-25 ENCOUNTER — Encounter: Payer: Self-pay | Admitting: Gynecologic Oncology

## 2024-05-25 VITALS — BP 139/55 | HR 80 | Temp 97.7°F | Resp 20 | Wt 195.6 lb

## 2024-05-25 DIAGNOSIS — Z90722 Acquired absence of ovaries, bilateral: Secondary | ICD-10-CM | POA: Insufficient documentation

## 2024-05-25 DIAGNOSIS — L089 Local infection of the skin and subcutaneous tissue, unspecified: Secondary | ICD-10-CM | POA: Insufficient documentation

## 2024-05-25 DIAGNOSIS — Z9221 Personal history of antineoplastic chemotherapy: Secondary | ICD-10-CM | POA: Insufficient documentation

## 2024-05-25 DIAGNOSIS — Z853 Personal history of malignant neoplasm of breast: Secondary | ICD-10-CM | POA: Insufficient documentation

## 2024-05-25 DIAGNOSIS — Z90710 Acquired absence of both cervix and uterus: Secondary | ICD-10-CM | POA: Insufficient documentation

## 2024-05-25 DIAGNOSIS — Z9079 Acquired absence of other genital organ(s): Secondary | ICD-10-CM | POA: Diagnosis not present

## 2024-05-25 DIAGNOSIS — Z923 Personal history of irradiation: Secondary | ICD-10-CM | POA: Insufficient documentation

## 2024-05-25 DIAGNOSIS — Z8542 Personal history of malignant neoplasm of other parts of uterus: Secondary | ICD-10-CM | POA: Insufficient documentation

## 2024-05-25 DIAGNOSIS — Z8543 Personal history of malignant neoplasm of ovary: Secondary | ICD-10-CM | POA: Diagnosis not present

## 2024-05-25 MED ORDER — DOXYCYCLINE HYCLATE 100 MG PO TABS: 100.0000 mg | ORAL_TABLET | Freq: Two times a day (BID) | ORAL | 0 refills | Status: AC

## 2024-05-25 NOTE — Progress Notes (Signed)
 Gynecologic Oncology Return Clinic Visit  05/25/24  Reason for Visit: surveillance   Treatment History: Oncology History Overview Note  Breast cancer of upper-inner quadrant of left female breast [facility])   Staging form: Breast, AJCC 7th Edition     Clinical stage from 12/31/2015: Stage IIA (T2, N0, M0) - Signed by Almarie Bedford, MD on 12/31/2015  ER,PR positive Her2 neg   History of ovarian & endometrial cancer  10/15/2004 Initial Diagnosis   History of ovarian cancer, treated with chemotherapy carbo/Taxol   03/17/2009 Bone Marrow Biopsy   Bone marrow biopsy at [facility] showed neutropenia   01/23/2014 Bone Marrow Biopsy   Repeat bone marrow biopsy showed neutropenia   09/12/2014 Imaging   CT scan showed possible cancer   10/14/2014 Imaging   MRI show significant pelvic mass without invasion into the bladder but abutting to the rectum   11/19/2014 Surgery   she underwent surgery and had robotic-assisted lysis of adhesions, converted to laparotomy, radical upper vaginectomy and low anterior resection with colostomy. 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 Resection margins were negative   01/29/2015 - 03/10/2015 Radiation Therapy   She received adjuvant radiation   03/03/2015 Imaging   CT scan of the abdomen and pelvis showed status post interval removal of the bilateral nephroureteral stents. Worsening moderate right hydroureteronephrosis. Resolved left hydroureteronephrosis.   03/20/2015 Procedure   she had cystoscopy and stent placement for right hydronephrosis   05/02/2015 Surgery   Cystoscopy with right retrograde pyelogram interpretation. Diagnostic ureteroscopy. Removal of right ureteral stent.   05/14/2015 Imaging   CT scan of the abdomen and pelvis show unilateral right  hydronephrosis with no residual cancer   08/18/2015 Imaging   CT scan showed hysterectomy with stable presacral soft tissue thickening. No definitive evidence of recurrent or metastatic disease. Severe right hydronephrosis   09/12/2015 Surgery   Cystoscopy with right retrograde pyelogram interpretation. Right ureteral stent placement 5 x 24 Polaris, no tether   10/07/2016 Imaging   Stable exam.  No new or progressive findings. 2. Stable appearance abnormal presacral soft tissue compatible with post treatment change. 3. Internal right ureteral stent with persistent right hydroureteronephrosis. Differentially decreased perfusion of the right kidney suggests a component of underlying obstructive uropathy. 4. Stable 13 mm left adrenal nodule, previously characterized as adenoma.       01/27/2017 Imaging   Stable postop and post radiation changes in presacral region. No evidence of recurrent or metastatic carcinoma within the chest, abdomen, or pelvis. Stable mild to moderate right hydroureteronephrosis, with right ureteral stent in appropriate position. Stable small benign left adrenal adenoma and left thyroid  lobe nodule.   10/02/2019 Tumor Marker   Patient's tumor was tested for the following markers: CA-125 Results of the tumor marker test revealed 5.8   Breast cancer of upper-inner quadrant of left female breast (HCC)  10/14/2015 Imaging   Screening mammogram showed possible distortion in the left breast.   10/24/2015 Imaging   Targeted ultrasound is performed, showing a 0.6 x 0.8 x 0.9 cm area of hypoechoic distortion at the 10 o'clock position of the left breast 5 cm from the nipple   10/24/2015 Imaging   Diagnostic imaging confirmed 0.6 x 0.8 x 0.9 cm distortion in the upper inner left breast   10/30/2015 Procedure   Left US  guided biopsy was performed   10/30/2015 Pathology Results  Accession: [id number] showed invasive ductal carcinoma, ER/PR positive, Her2 neg   11/05/2015 Genetic  Testing   Genetic testing did not reveal a deleterious mutation.  Genetic testing did detect a Variant of Unknown Significance in the MSH6 gene called c.389A>G..  Genes tested include: APC, ATM, AXIN2, BARD1, BMPR1A, BRCA1, BRCA2, BRIP1, CDH1, CDKN2A, CHEK2, DICER1, EPCAM, GREM1, KIT, MEN1, MLH1, MSH2, MSH6, MUTYH, NBN, NF1, PALB2, PDGFRA, PMS2, POLD1, POLE, PTEN, RAD50, RAD51C, RAD51D, SDHA, SDHB, SDHC, SDHD, SMAD4, SMARCA4. STK11, TP53, TSC1, TSC2, and VHL.  UPDATE: MSH6 c.389A>G (p.His130Arg) has been reclassified as Likely Benign.  The amended report date is February 25, 2021.   12/11/2015 Surgery   She undwerwent bilateral mastectomy, left SLN biopsy and immediate reconstruction surgeries with bilateral plasma expanders   12/11/2015 Pathology Results   Accession: [id number] mastectomy specimens showed left breast ca, pT2N0M0   12/26/2015 Pathology Results   Accession: [id number] specimen from debridement showed no cancer   12/26/2015 Surgery   she underwent surgical debridement of necrotic tissue at site of plasma expander   01/12/2016 Imaging   Bone density is normal   02/02/2016 -  Anti-estrogen oral therapy   She started taking Arimidex    10/21/2017 Procedure   Ultrasound guided biopsy of a vague hypoechoic shadowing lesion, generally corresponding to area palpable abnormality, along chest lateral to the reconstructed left breast and inferior to the left axilla. No apparent complications.   01/17/2018 Imaging   Bone density scan is normal    She has a history of endometrial and ovarian endometrioid carcinoma treated in 2006 by Dr. Jayson Denver at [facility] in [place]. Her surgery (TAH, BSO) was followed by adjuvant chemotherapy with carboplatin plus paclitaxel due to the ovarian involvement and the presence of a cul de sac lesion also positive for disease. She denies receiving adjuvant radiation therapy. She is unclear if she had metastatic endometrial cancer to the ovary  or duel primaries. She had a complete response to therapy however developed leukopenia in July 2010. 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 She sees Dr. Lonn for this and is treated with G-CSF injections.   She began experiencing rectal pain approximately in January 2016. She also reports narrowing of caliber of the stool. She denies hematochezia. She does report approximately 3 months of vaginal spotting. She's had no specific follow-up for her gynecologic cancers in the past 4 years.   As part of workup of her rectal pain she underwent a CT scan of the abdomen and pelvis on 09/12/2014. This demonstrated a new right perirectal mass abutting the vaginal cuff measuring 3.8 x 4.9 cm. There is a limited fat plane between the mass and the rectum posteriorly. Rectal invasion could not be excluded. There were no other masses identified in the abdomen and pelvis or lymphadenopathy. There is no other evidence of metastatic disease or recurrent disease. There was no hydronephrosis. A CA-125 drawn on 09/12/2014 was normal at 10.   PET was negative for extrapelvic disease. MRI defined the lesion as a 3.5x5cm lesion to the right of the rectum at the vaginal cuff.   Colonoscopy was performed on 10/02/14 with transrectal US  and biopsy and this revealed endometrioid adenocarcinoma. Of note, the lesion was not seen within the lumen of the rectum.    She then underwent a posterior supralevator exenteration with colostomy and bladder repair and stent placement on 11/19/14 without complications with Dr Eloy and Dr  Gross.  Her postoperative course was uncomplicate with the exception of development of postop anemia.  Her final pathology revealed endometrioid adenocarcinoma invading the vagina and rectum with negative margins on the specimen. The margin had been close (clinically) to the right pelvic  sidewall which was marked with surgical clips.   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 It was treated with IV antibiotics and then on POD 15 she was taken to the OR for laparoscopic revision of the stoma with Dr Elspeth Schultze. Postoperatively she had wound vac and packing for her stomal wound.   A retrograde cystogram on week 4 postop confirmed an intact bladder and the foley was removed.   She initially had some voiding issues with decreased sensation to void. We tested a post void residual in May, 2016 and this revealed adequate voiding.   On 12/30/14 she was admitted to [facility] with sepsis associated with Enterobacter Cloacae UTI. This was treated with IV antibiotics and then a prolonged course of oral cipro . Imaging performed at the time of admission (a CT of the abdo/pelvis) revealed: Bilateral double-J internal ureteral stents in adequate position with persistent mild bilateral hydronephrosis    She complete radiation therapy from 01/29/15 to 03/10/15 with 50Gy of external beam radiation and IMRT. She tolerated therapy well with minor skin irritation.   She had her ureteral stents removed in June, 2016, however, then developed right ureteral obstruction, hydroureter and pain. She went to the OR with Urologist Dr Alvaro on 03/20/15 for ureteral dilation and right stent placement (the left was draining well). No tumor was seen on cysto.    CT abdo/pelvis on 05/14/15 showed: no new lesions, stable thickening in right pelvis/distal right ureter consistent with radiation effect. The right ureteral stent was removed.   The patient's CT in January 2017 showed progression of her right ureteral obstruction after stent removal. Dr Alvaro took her to the OR on 09/05/15 for a cystoscopy, retrograde pyelogram and right ureteral stent placement.    The CT on 08/18/15 showed decreased attenuation of the right renal parenchyma, right  hydronephrosis that was severe no excretion of contrast on that for graphic phase imaging. The perivascular soft tissue thickening in the pelvis and presacral regions grossly stable consistent with radiation changes.   The patient was diagnosed with ER/PR positive left breast cancer in April 2017. Surgery is scheduled for May. She underwent bilateral mastectomy with expander reconstruction. Postoperatively she was treated with Arimidex .    Repeat CT imaging on 02/08/16 revealed : Stable post treatment changes in presacral region. No evidence of recurrent or metastatic carcinoma within the abdomen or pelvis. Interval placement of right ureteral stent in appropriate position. Moderate right hydroureteronephrosis remains stable. Stable benign left adrenal adenoma and hepatic steatosis.   On 06/30/16 she had replacement of her right ureteral stent with Dr Alvaro.   On 10/07/16 CT surveillance was performed which showed: Stable exam.  No new or progressive findings. Stable appearance abnormal presacral soft tissue compatible with post treatment change. Internal right ureteral stent with persistent right hydroureteronephrosis. Differentially decreased perfusion of the right kidney suggests a component of underlying obstructive uropathy. Stable 13 mm left adrenal nodule, previously characterized as adenoma. A parastomal hernia was noted.   CT 01/27/17 showed stable findings with no evidence of recurrence. Persistent right hydroureter was again noted.   Her colostomy bothered her greatly and she hoped to be free of stomal  appliances. She was frustrated with repetitive stent replacements and desired definitive reconstruction/reimplantation of the right ureter.     Given that a 2 year disease free interval had elapsed, in 04/01/17 Dr Alvaro and Dr [name] returned to the operating room with Ms Stanislawski for a right ureteral resection, and bladder hitch with reimplantation and reversal of hartman's with temporary ileostomy  complicated by postop development of urinoma which required a peritoneal drain and right ureteral stent.   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 She was offered permanent diversion with ileal conduit and permanent end colostomy. This was not considered a reasonable option at that time by [facility]. She became quite depressed by the slow recovery and sequelae of her last surgery. She sought second opinion with Gynecologic Oncology at [facility]. They offered referral to urology and colorectal specialists at [facility]. They recommended no additional cancer treatment as she was disease free. She is being worked up by them for possible laparotomy, revision of right uretero-neocystotomy, placement of VRAM flap, and possible bowel resection and revision of ileostomy. Prior to embarking on this surgery they requested she lose weight to a BMI of <30kg/m2.   She had lost weight to a BMI <30 throughout 2020. She experienced some episodes of passage of bloody mucous from the vagina in May, 2020 and so an CT scan was performed which showed no signs of pelvic recurrence.    In September the patient embarked on a large procedure at [facility].  She underwent an exploratory laparotomy panniculectomy cystectomy creation of urinary conduit with ileum, proctectomy, and formation of permanent and colostomy.  The procedure was uncomplicated.  She reported that while plastic surgery had planned to take part in the procedure with placement of a flap, it was determined that this was not necessary intraoperatively.  While the procedure note in Care Everywhere states that a hysterectomy was performed, this had already been performed more than a decade prior.  Additionally there was no myocutaneous flap placed despite the procedure reporting as such.  No malignancy was identified in the specimens.    The patient had an uncomplicated procedure overall and a 7-day hospital stay with subsequent rehab.  She developed some wound healing issues at her colostomy site.   She is struggled with vaginal drainage and bleeding since.   She was seen in our clinic by Dr. Eloy in June 2021.  In the setting of continued vaginal bleeding and drainage, she sought consultation at [facility] with Dr. Mancil.  On exam, vagina noted to be atrophic.  Blood was seen from the apex of the vaginal vault and on bimanual exam the vault and apex extended posteriorly towards the sacrum without an obvious defect.  Given some concern for possibility of osteomyelitis this driving this drainage, MRI was performed.  MRI on 09/27/2020 showed a 2.3 x 1.9 x 3.3 cm fluid collection that appears contiguous with the rectal pouch via thickened fluid-filled tract and questionable thin fistulous tract anteriorly to the vaginal cuff.  Marked presacral soft tissue thickening and stranding likely reflecting posttreatment changes noted.  No definite evidence of local recurrence.   08/19/21: Excision of 1 cm left upper vulva/groin mass, Examiner anesthesia, vulvar biopsies.  Pathology revealed benign epidermal inclusion cyst.  Vaginal biopsy showed ulcerated squamous mucosa with lymphoplasmacytic infiltrate, no dysplasia or malignancy.   08/25/21: CT of the abdomen and pelvis showed no acute findings.  No evidence of recurrent or metastatic disease.  Stable small benign left adrenal adenoma.    CT of the abdomen and pelvis in April 2024 was negative for acute intra-abdominal or pelvic pathology including no evidence of metastatic disease.  Posttreatment soft tissue changes in the pelvis and presacral region stable.  No drainable fluid collection.  Interval History: Overall doing well.  Now undergoing BUD exchange (in the setting of ureteral stricture through her ileal conduit at [facility]) q 6 weeks.  Denies any skin issues around her colostomy.  Around  her ileal conduit.  She has some findings concerning for skin infection again.  Notes occasional intermittent pink spotting.  Fistula output has been up and down.  Notes some abdominal bloating.  Past Medical/Surgical 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   oncologist-- dr rossi/ dr gorsuch:/   dx endometroid endometrial & ovarian cancer s/p  chemotheapy and surgery(TAH w/ BSO) :  recurrent 11-19-2014 post pelvic surgery and radiation 01-29-2015 to 03-10-2015   Chronic idiopathic neutropenia    presumed related to chemotherapy March 2006--- followed by dr lonn (treatment w/ G-CSF injections   Chronic nausea    CKD (chronic kidney disease), stage III [facility])    nephrologist-  dr almarie bonine   Difficult intravenous access    small veins--- hx PICC lines   DM type 2 (diabetes mellitus, type 2) (HCC)    endocriologist--  dr norman sebastian   (08-18-2021  per pt checks blood sugar daily in am,  fasting sugar--92--130)   Dysrhythmia    Environmental and seasonal allergies    Generalized muscle weakness    GERD (gastroesophageal reflux disease)    Hiatal hernia    History of abdominal abscess 04/16/2017   post surgery 04-01-2017  --- resolved 10/ 2018   History of COVID-19 05/2021   per pt mild symptoms that resolved   History of gastric polyp    2014  duodenum   History of ileus 04/16/2017   resolved w/ no surgical intervention   History of radiation therapy    01-29-2015 to 03-10-2015  pelvis 50.4Gy   History of small bowel obstruction 01/2019   Hypothyroidism    monitored  by dr ozell altheimer   Ileostomy in place [facility]) 04/01/2017   created at same time colostomy takedown./  permnant 09/ 2020   Mixed dyslipidemia    Multiple thyroid  nodules    Managed by Dr. Eletha   PAF (paroxysmal atrial fibrillation) [facility])    cardiologist--- dr h. 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   Vitamin D  deficiency    Vulval lesion    Wears glasses     Past Surgical History:  Procedure Laterality Date   APPENDECTOMY  BREAST RECONSTRUCTION WITH PLACEMENT OF TISSUE EXPANDER AND FLEX HD (ACELLULAR HYDRATED DERMIS) Bilateral 12/11/2015   Procedure: BILATERAL BREAST RECONSTRUCTION WITH PLACEMENT OF TISSUE EXPANDERS;  Surgeon: Earlis Ranks, MD;  Location: [facility] OR;  Service: Plastics;  Laterality: Bilateral;   COLONOSCOPY WITH PROPOFOL  N/A 08/21/2013   Procedure: COLONOSCOPY WITH PROPOFOL ;  Surgeon: Lamar LULLA Bunk, MD;  Location: [facility];  Service: Endoscopy;  Laterality: N/A;   COLOSTOMY TAKEDOWN N/A 12/04/2014   Procedure: LAPROSCOPIC LYSIS OF ADHESIONS, SPLENIC MOBILIZATION, RELOCATION OF COLOSTOMY, DEBRIDEMENT INITIAL COLOSTOMY SITE;  Surgeon: Elspeth Schultze, MD;  Location: [facility];  Service: General;  Laterality: N/A;   CYSTECTOMY W/ URETEROILEAL CONDUIT  04/12/2019   @[facility] ;   INTESTINAL ANASTOMOSIS, PANNICULECTOMY, PELVIS EXENTENTION,  PARTIAL COLECTOMY REMOVAL ILEIUM WITH PERMNANT ILEOCOLOSTOMY   CYSTOGRAM N/A 06/01/2017   Procedure: CYSTOGRAM;  Surgeon: Alvaro Hummer, MD;  Location: [facility];  Service: Urology;  Laterality: N/A;   CYSTOSCOPY W/ RETROGRADES Right 11/21/2015   Procedure: CYSTOSCOPY WITH RETROGRADE PYELOGRAM;   Surgeon: Hummer Alvaro, MD;  Location: [facility];  Service: Urology;  Laterality: Right;   CYSTOSCOPY W/ URETERAL STENT PLACEMENT Right 11/21/2015   Procedure: CYSTOSCOPY WITH STENT REPLACEMENT;  Surgeon: Hummer Alvaro, MD;  Location: [facility];  Service: Urology;  Laterality: Right;   CYSTOSCOPY W/ URETERAL STENT PLACEMENT Right 03/10/2016   Procedure: CYSTOSCOPY WITH STENT REPLACEMENT;  Surgeon: Hummer Alvaro, MD;  Location: [facility];  Service: Urology;  Laterality: Right;   CYSTOSCOPY W/ URETERAL STENT PLACEMENT Right 06/30/2016   Procedure: CYSTOSCOPY WITH RETROGRADE PYELOGRAM/URETERAL STENT EXCHANGE;  Surgeon: Hummer Alvaro, MD;  Location: [facility];  Service: Urology;  Laterality: Right;   CYSTOSCOPY W/ URETERAL STENT PLACEMENT N/A 06/01/2017   Procedure: CYSTOSCOPY WITH EXAM UNDER ANESTHESIA;  Surgeon: Alvaro Hummer, MD;  Location: [facility];  Service: Urology;  Laterality: N/A;   CYSTOSCOPY W/ URETERAL STENT PLACEMENT Right 08/17/2017   Procedure: CYSTOSCOPY WITH RETROGRADE PYELOGRAM/URETERAL STENT REMOVAL;  Surgeon: Alvaro Hummer, MD;  Location: [facility];  Service: Urology;  Laterality: Right;   CYSTOSCOPY WITH RETROGRADE PYELOGRAM, URETEROSCOPY AND STENT PLACEMENT Right 03/20/2015   Procedure: CYSTOSCOPY WITH RETROGRADE PYELOGRAM, URETEROSCOPY WITH BALLOON DILATION AND STENT PLACEMENT ON RIGHT;  Surgeon: Hummer Alvaro, MD;  Location: [facility];  Service: Urology;  Laterality: Right;   CYSTOSCOPY WITH RETROGRADE PYELOGRAM, URETEROSCOPY AND STENT PLACEMENT Right 05/02/2015   Procedure: CYSTOSCOPY WITH RIGHT RETROGRADE PYELOGRAM,  DIAGNOSTIC URETEROSCOPY AND STENT PULL ;  Surgeon: Hummer Alvaro, MD;  Location: [facility];  Service: Urology;  Laterality: Right;   CYSTOSCOPY WITH RETROGRADE PYELOGRAM, URETEROSCOPY AND STENT PLACEMENT Right 09/05/2015   Procedure: CYSTOSCOPY WITH RETROGRADE PYELOGRAM,  AND  STENT PLACEMENT;  Surgeon: Hummer Alvaro, MD;  [facility];  Service: Urology;  Laterality: Right;   CYSTOSCOPY WITH RETROGRADE PYELOGRAM, URETEROSCOPY AND STENT PLACEMENT Right 04/01/2017   Procedure: CYSTOSCOPY WITH RETROGRADE PYELOGRAM, URETEROSCOPY AND STENT PLACEMENT;  Surgeon: Alvaro Hummer, MD;  Location: [facility];  Service: Urology;  Laterality: Right;   CYSTOSCOPY WITH STENT PLACEMENT Right 10/27/2016   Procedure: CYSTOSCOPY WITH STENT CHANGE and right retrograde pyelogram;  Surgeon: Hummer Alvaro, MD;  Location[facility];  Service: Urology;  Laterality: Right;   EUS N/A 10/02/2014   Procedure: LOWER ENDOSCOPIC ULTRASOUND (EUS);  Surgeon: Elsie Cree, MD;  Location: [facility];  Service: Endoscopy;  Laterality: N/A;   EXCISION SOFT TISSUE MASS RIGHT FOREMAN  12/08/2006   EYE SURGERY  as child  pytosis of eyelids repair   FOOT SURGERY  10/2023   Left   INCISION AND DRAINAGE OF WOUND Bilateral 12/26/2015   Procedure: DEBRIDEMENT OF BILATERAL MASTECTOMY FLAPS;  Surgeon: Earlis Ranks, MD;  Location: [facility];  Service: Plastics;  Laterality: Bilateral;   IR CV LINE INJECTION  05/31/2017   IR FLUORO GUIDE CV LINE LEFT  05/31/2017   IR FLUORO GUIDE CV LINE RIGHT  04/06/2017   IR FLUORO GUIDE CV MIDLINE PICC RIGHT  05/30/2017   IR NEPHROSTOGRAM LEFT INITIAL PLACEMENT  09/02/2017   IR NEPHROSTOGRAM LEFT THRU EXISTING ACCESS  11/29/2017   IR NEPHROSTOGRAM RIGHT INITIAL PLACEMENT  09/02/2017   IR NEPHROSTOGRAM RIGHT THRU EXISTING ACCESS  09/13/2017   IR NEPHROSTOGRAM RIGHT THRU EXISTING ACCESS  11/29/2017   IR NEPHROSTOMY EXCHANGE LEFT  11/28/2017   IR NEPHROSTOMY EXCHANGE LEFT  01/05/2018   IR NEPHROSTOMY EXCHANGE LEFT  02/16/2018   IR NEPHROSTOMY EXCHANGE LEFT  03/30/2018   IR NEPHROSTOMY EXCHANGE LEFT  05/12/2018   IR NEPHROSTOMY EXCHANGE LEFT  06/21/2018   IR NEPHROSTOMY EXCHANGE LEFT  08/04/2018   IR NEPHROSTOMY EXCHANGE LEFT   09/18/2018   IR NEPHROSTOMY EXCHANGE LEFT  10/09/2018   IR NEPHROSTOMY EXCHANGE LEFT  10/27/2018   IR NEPHROSTOMY EXCHANGE LEFT  11/21/2018   IR NEPHROSTOMY EXCHANGE LEFT  01/05/2019   IR NEPHROSTOMY EXCHANGE LEFT  02/15/2019   IR NEPHROSTOMY EXCHANGE LEFT  03/29/2019   IR NEPHROSTOMY EXCHANGE RIGHT  10/02/2017   IR NEPHROSTOMY EXCHANGE RIGHT  11/28/2017   IR NEPHROSTOMY EXCHANGE RIGHT  01/05/2018   IR NEPHROSTOMY EXCHANGE RIGHT  02/16/2018   IR NEPHROSTOMY EXCHANGE RIGHT  03/30/2018   IR NEPHROSTOMY EXCHANGE RIGHT  05/12/2018   IR NEPHROSTOMY EXCHANGE RIGHT  06/21/2018   IR NEPHROSTOMY EXCHANGE RIGHT  08/04/2018   IR NEPHROSTOMY EXCHANGE RIGHT  09/18/2018   IR NEPHROSTOMY EXCHANGE RIGHT  10/27/2018   IR NEPHROSTOMY EXCHANGE RIGHT  11/21/2018   IR NEPHROSTOMY EXCHANGE RIGHT  01/05/2019   IR NEPHROSTOMY EXCHANGE RIGHT  02/15/2019   IR NEPHROSTOMY EXCHANGE RIGHT  03/29/2019   IR NEPHROSTOMY PLACEMENT LEFT  10/02/2017   IR RADIOLOGIST EVAL & MGMT  05/03/2017   IR US  GUIDE VASC ACCESS LEFT  05/31/2017   IR US  GUIDE VASC ACCESS RIGHT  04/06/2017   IR US  GUIDE VASC ACCESS RIGHT  05/30/2017   LAPAROSCOPIC CHOLECYSTECTOMY  1990   LIPOSUCTION WITH LIPOFILLING Bilateral 04/16/2016   Procedure: LIPOSUCTION WITH LIPOFILLING TO BILATERAL CHEST;  Surgeon: Earlis Ranks, MD;  Location: [facility];  Service: Plastics;  Laterality: Bilateral;   MASTECTOMY W/ SENTINEL NODE BIOPSY Bilateral 12/11/2015   Procedure: RIGHT PROPHYLACTIC MASTECTOMY, LEFT TOTAL MASTECTOMY WITH LEFT AXILLARY SENTINEL LYMPH NODE BIOPSY;  Surgeon: Jina Nephew, MD;  Location: [facility];  Service: General;  Laterality: Bilateral;   OSTOMY N/A 11/19/2014   Procedure: OSTOMY;  Surgeon: Elspeth Schultze, MD;  Location: [facility];  Service: General;  Laterality: N/A;   PROCTOSCOPY N/A 04/01/2017   Procedure: RIDGE PROCTOSCOPY;  Surgeon: Schultze Elspeth, MD;  Location: [facility];  Service: General;  Laterality: N/A;   REMOVAL OF  BILATERAL TISSUE EXPANDERS WITH PLACEMENT OF BILATERAL BREAST IMPLANTS Bilateral 04/16/2016   Procedure: REMOVAL OF BILATERAL TISSUE EXPANDERS WITH PLACEMENT OF BILATERAL BREAST IMPLANTS;  Surgeon: Earlis Ranks, MD;  Location:  SURGERY CENTER;  Service: Plastics;  Laterality: Bilateral;   ROBOTIC ASSISTED LAP VAGINAL HYSTERECTOMY N/A 11/19/2014   Procedure: ROBOTIC LYSIS OF ADHESIONS, CONVERTED TO LAPAROTOMY  RADICAL UPPER VAGINECTOMY,LOW ANTERIOR BOWEL RESECTION, COLOSTOMY, BILATERAL URETERAL STENT PLACEMENT AND CYSTONOMY CLOSURE;  Surgeon: Maurilio Ship, MD;  Location: [facility];  Service: Gynecology;  Laterality: N/A;   TISSUE EXPANDER FILLING Bilateral 12/26/2015   Procedure: EXPANSION OF BILATERAL CHEST TISSUE EXPANDERS (60 mL- Right; 75 mL- Left);  Surgeon: Earlis Ranks, MD;  Location: [facility];  Service: Plastics;  Laterality: Bilateral;   TONSILLECTOMY     TOTAL ABDOMINAL HYSTERECTOMY  March 2006   [facility]   and Bilateral Salpingoophorectomy/  staging for Ovarian cancer/  an   VULVAR LESION REMOVAL N/A 08/19/2021   Procedure: VULVAR LESION with biopsies;  Surgeon: Viktoria Comer SAUNDERS, MD;  Location: [facility];  Service: Gynecology;  Laterality: N/A;   XI ROBOTIC ASSISTED LOWER ANTERIOR RESECTION N/A 04/01/2017   Procedure: XI ROBOTIC VS LAPAROSCOPIC COLOSTOMY TAKEDOWN WITH LYSIS OF ADHESIONS.;  Surgeon: [name] Standing, MD;  Location: [facility];  Service: General;  Laterality: N/A;  ERAS PATHWAY    Family History  Problem Relation Age of Onset   Cancer Mother 109       stomach ca   Hypertension Mother    Cancer Father 41       prostate ca   Diabetes Father    Heart disease Father        CABG   Hypertension Father    Hyperlipidemia Father    Obesity Father    Breast cancer Maternal Aunt        dx in her 3s   Lymphoma Paternal Aunt    Brain cancer Paternal Grandfather    Ovarian cancer Other    Diabetes Sister    Hypertension Brother  y-10   Heart disease Brother        CABG   Diabetes Brother     Social History   Socioeconomic History   Marital status: Married    Spouse name: Clinical research associate   Number of children: 1   Years of education: Not on file   Highest education level: Master's degree (e.g., MA, MS, MEng, MEd, MSW, MBA)  Occupational History   Occupation: retired Charity fundraiser from [employer]    Comment: L&D Charity fundraiser - retired  Tobacco Use   Smoking status: Never    Passive exposure: Never   Smokeless tobacco: Never  Vaping Use   Vaping status: Never Used  Substance and Sexual Activity   Alcohol  use: Yes    Comment: Occ   Drug use: No   Sexual activity: Not Currently  Other Topics Concern   Not on file  Social History Narrative   Exercise-- has not gotten back into it since cancer came back   Social Drivers of Health   Financial Resource Strain: Low Risk  (02/16/2024)   Overall Financial Resource Strain (CARDIA)    Difficulty of Paying Living Expenses: Not very hard  Food Insecurity: No Food Insecurity (02/16/2024)   Hunger Vital Sign    Worried About Running Out of Food in the Last Year: Never true    Ran Out of Food in the Last Year: Never true  Transportation Needs: No Transportation Needs (02/16/2024)   PRAPARE - Administrator, Civil Service (Medical): No    Lack of Transportation (Non-Medical): No  Physical Activity: Insufficiently Active (02/16/2024)   Exercise Vital Sign    Days of Exercise per Week: 1 day    Minutes of Exercise per Session: 30 min  Stress: No Stress Concern Present (02/16/2024)   [employer] of  Occupational Health - Occupational Stress Questionnaire    Feeling of Stress: Not at all  Social Connections: Socially Integrated (02/16/2024)   Social Connection and Isolation Panel    Frequency of Communication with Friends and Family: More than three times a week    Frequency of Social Gatherings with Friends and Family: Three times a week    Attends Religious Services: More than 4  times per year    Active Member of Clubs or Organizations: Yes    Attends Banker Meetings: More than 4 times per year    Marital Status: Married    Current Medications:  Current Outpatient Medications:    doxycycline  (VIBRA -TABS) 100 MG tablet, Take 1 tablet (100 mg total) by mouth 2 (two) times daily for 14 days., Disp: 28 tablet, Rfl: 0   aspirin  EC 81 MG tablet, Take 1 tablet (81 mg total) by mouth daily. Swallow whole., Disp: 90 tablet, Rfl: 3   Biotin  5 MG TABS, Take 5 mg by mouth daily. , Disp: , Rfl:    buPROPion  (WELLBUTRIN ) 100 MG tablet, Take 1 tablet (100 mg total) by mouth 2 (two) times daily., Disp: 180 tablet, Rfl: 0   calcium  citrate-vitamin D  (CITRACAL+D) 315-200 MG-UNIT tablet, Take 1 tablet by mouth daily., Disp: , Rfl:    Cholecalciferol  (VITAMIN D3) 10000 UNITS capsule, Take 10,000 Units by mouth See admin instructions. Take 1 tablet by mouth 43times per week., Disp: , Rfl:    ciclopirox  (PENLAC ) 8 % solution, APPLY TOPICALLY AT BEDTIME. APPLY OVER NAIL AND SURROUNDING SKIN. APPLY DAILY OVER PREVIOUS COAT. AFTER SEVEN (7) DAYS, MAY REMOVE WITH ALCOHOL  AND CONTINUE CYCLE., Disp: 6.6 mL, Rfl: 0   ciclopirox  (PENLAC ) 8 % solution, Apply topically at bedtime. Apply over nail and surrounding skin. Apply daily over previous coat. After seven (7) days, may remove with alcohol  and continue cycle., Disp: 6.6 mL, Rfl: 0   clobetasol  (OLUX ) 0.05 % topical foam, Apply topically 2 (two) times daily., Disp: 50 g, Rfl: 3   Continuous Blood Gluc Sensor (FREESTYLE LIBRE 3 SENSOR) MISC, Inject 1 sensor to the skin every 14 days for continuous glucose monitoring., Disp: , Rfl:    Cranberry-Vitamin C  15000-100 MG CAPS, Take by mouth daily. 1 in Morning and 1 in PM, Disp: , Rfl:    famotidine -calcium  carbonate-magnesium  hydroxide (PEPCID  COMPLETE) 10-800-165 MG chewable tablet, Chew 1 tablet by mouth as needed., Disp: , Rfl:    ferrous sulfate  325 (65 FE) MG tablet, Take 1 tablet  (325 mg total) by mouth at bedtime., Disp: 30 tablet, Rfl: 3   fluticasone  (FLONASE ) 50 MCG/ACT nasal spray, Place 2 sprays into both nostrils daily., Disp: 16 g, Rfl: 3   ibuprofen  (ADVIL ) 800 MG tablet, Take 1 tablet (800 mg total) by mouth every 6 (six) hours as needed., Disp: 60 tablet, Rfl: 1   levothyroxine  (SYNTHROID , LEVOTHROID) 150 MCG tablet, Take 1 tablet (150 mcg total) by mouth daily before breakfast., Disp: 30 tablet, Rfl: 1   loratadine  (CLARITIN ) 10 MG tablet, Take 10 mg by mouth daily. , Disp: , Rfl:    moxifloxacin  (VIGAMOX ) 0.5 % ophthalmic solution, Place 1 drop into both eyes 3 (three) times daily., Disp: 3 mL, Rfl: 0   NIVESTYM  480 MCG/0.8ML SOSY injection, INJECT THE CONTENTS OF ONE 0.8 ML SYRINGE (480 MCG) INTO THE SKIN EVERY 7 DAYS, Disp: 3.2 mL, Rfl: 11   omega-3 acid ethyl esters (LOVAZA ) 1 G capsule, Take 1 g by mouth 2 (two) times daily. ,  Disp: , Rfl:    Polyethyl Glycol-Propyl Glycol (SYSTANE OP), Place 1 drop into both eyes daily as needed (dry eyes). , Disp: , Rfl:    pregabalin  (LYRICA ) 50 MG capsule, Take 1 capsule (50 mg total) by mouth 2 (two) times daily., Disp: 180 capsule, Rfl: 1   Prenatal Vit-Fe Fumarate-FA (PRENATAL VITAMIN PO), Take 1 capsule by mouth daily. Takes prenatal because there are no dyes in it, Disp: , Rfl:    Probiotic Product (ALIGN PO), Take 1 capsule by mouth daily., Disp: , Rfl:    rosuvastatin  (CRESTOR ) 10 MG tablet, Take 10 mg by mouth every evening., Disp: , Rfl:    Saccharomyces boulardii (FLORASTOR PO), Take 1 capsule by mouth at bedtime., Disp: , Rfl:    senna (SENOKOT) 8.6 MG tablet, Take 1 tablet (8.6 mg total) by mouth daily., Disp: 90 tablet, Rfl: 3   Tazarotene  (ARAZLO ) 0.045 % LOTN, Apply 1 application topically daily., Disp: 45 g, Rfl: 0   tirzepatide  (MOUNJARO ) 2.5 MG/0.5ML Pen, Inject 2.5 mg into the skin once a week., Disp: 6 mL, Rfl: 0   tiZANidine  (ZANAFLEX ) 4 MG tablet, Take 0.5-1 tablets (2-4 mg total) by mouth every 8  (eight) hours as needed for muscle spasms., Disp: 180 tablet, Rfl: 3   UNABLE TO FIND, 4 mg every day by oral route., Disp: , Rfl:    XIFAXAN 550 MG TABS tablet, Take 550 mg by mouth as needed., Disp: , Rfl:   Review of Systems: + Bloating, ringing in ears, back pain, itching Denies appetite changes, fevers, chills, fatigue, unexplained weight changes. Denies hearing loss, neck lumps or masses, mouth sores or voice changes. Denies cough or wheezing.  Denies shortness of breath. Denies chest pain or palpitations. Denies leg swelling. Denies abdominal pain, blood in stools, constipation, diarrhea, nausea, vomiting, or early satiety. Denies pain with intercourse, dysuria, frequency, hematuria or incontinence. Denies hot flashes, pelvic pain, vaginal bleeding or vaginal discharge.   Denies joint pain or muscle pain/cramps. Denies rash, or wounds. Denies dizziness, headaches, numbness or seizures. Denies swollen lymph nodes or glands, denies easy bruising or bleeding. Denies anxiety, depression, confusion, or decreased concentration.  Physical Exam: BP (!) 139/55 (BP Location: Right Arm, Patient Position: Sitting)   Pulse 80   Temp 97.7 F (36.5 C) (Oral)   Resp 20   Wt 195 lb 9.6 oz (88.7 kg)   SpO2 100%   BMI 36.96 kg/m  General: Alert, oriented, no acute distress. HEENT: Normocephalic, atraumatic, sclera anicteric. Chest: Clear to auscultation bilaterally.  No wheezes or rhonchi. Cardiovascular: Regular rate and rhythm, no murmurs. Abdomen: Obese, soft, nontender.  Normoactive bowel sounds.  Ostomy bag in each lower quadrant, urine in the urostomy bag.  I cannot see skin immediately around to the ostomies secondary to the bag but skin surrounding ostomy bags without lesions.   Extremities: Grossly normal range of motion.  Warm, well perfused.  No edema bilaterally. Lymphatics: No cervical, supraclavicular, or inguinal adenopathy. GU: Normal appearing external female genitalia.  On  speculum exam with a small speculum, vaginal mucosa is normal in appearance.  There is a ring of constricted tissue where the vagina narrows approximately 4 cm above the introitus, vaginal mucosa is healthy appearing.  Minimal discharge noted.  On bimanual exam, no masses or nodularity appreciated.  Narrowing of the vagina is palpated with cavity above this narrowing.   Laboratory & Radiologic Studies: 02/21/24: CT A/P 1. No acute findings.  No evidence of small-bowel obstruction. 2. Lower  anterior resection with a left lower quadrant colostomy. 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 The possibility of a vaginal fistula cannot be excluded. 3. Cystectomy with a right lower quadrant ureteroileal conduit. Mild bilateral pelvicaliectasis, unchanged. No overt hydronephrosis. 4. Left adrenal adenoma.  Assessment & Plan: [name] is a 68 y.o. woman woman with a history of recurrent endometrioid endometrial/ovarian cancer.   S/p exploratory laparotomy, posterior supralevator pelvic exenteration, end colostomy, bladder repair, ureteral stenting with complete resection and negative margins on 11/09/14. S/p adjuvant radiation completed 03/10/15. Disease free since this time. Last imaging 01/2024 -no acute findings, no evidence of recurrent or metastatic disease.   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   Status post pelvic exenteration procedure at [facility] in September 2020 with proctectomy and cystectomy and formation of permanent colostomy and ileal conduit.  Vaginectomy not performed.   Persistent vaginal drainage, sometimes resolves completely around the time of antibiotics.   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 No specific  follow-up or surveillance recommended by genetics counselor.   Since her last visit with me, developed bilateral hydronephrosis requiring PCN placement with ultimate exchange to BUD tubes.    Plan: 1) Right lower quadrant pain and intermittent pSBO symptoms: Continues to have occasional right lower quadrant pain when she changes positions.  Does not endorse any partial SBO symptoms since her last visit with me.     2) History of recurrent endometrial/ovarian cancer: s/p complete resection with negative margins and s/p adjuvant radiation. No chemotherapy (in the recurrent setting) due to chronic idiopathic neutropenia. She has a history of primary adjuvant chemotherapy after her initial diagnosis of stage III endometrial cancer in 2006. CT imaging and extensive pelvic surgical evaluation in 2020 demonstrated complete clinical response. Imaging in early 2022 with no definitive evidence of recurrent disease. CT imaging most recently in 2025 negative for recurrent disease.   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 She has persistent vaginal drainage despite cystectomy and total proctectomy. She patient continues to have vaginal and rectal bleeding intermittently as well as vaginal discharge. She's been previously counseled that in the setting of vagina having been left in situ at the time of her exenteration in 04/2019, surgery to performed total vaginectomy and placement of myocutaneous flap would require another major surgery that would likely involve having her colostomy and ileal conduit having to be taken down and would be associated with significant morbidity.  Additionally there would be no guarantee that her tissues, severely damaged by radiation, would heal such as a second surgery and result in good outcomes. After the patient stopped her Eliquis , this significantly helped decrease  her bleeding. Hyperbaric oxygen  seem to help some also.  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  Had previously discussed with interventional radiology with recommendation against consideration of embolization or drain placement.  Findings continue to be stable on imaging. Bleeding/discharge seems to respond to antibiotics when she is on them for treatment of skin/soft tissue infections. Now undergoing q 6 weeks BUD exchange given ureteral stricture.   4) Chronic neutropenia - managed by Dr. Rick. Uses gCSF weekly.    5) Breast cancer - history of  stage IIA. ER/PR positive. Treated with surgery and arimidex .     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 Patient follows-up with Dr Donnald for close colon cancer and upper GI cancer surveillance  7) Skin infection -previously responded to doxycycline .  Prescription for doxycycline  sent to her pharmacy.  Discussed with her that if rash does not improve, to let me know and we will try medication for treatment of yeast.   From a cancer standpoint, I will continue to see her every 6 months.  20 minutes of total time was spent for this patient encounter, including preparation, face-to-face counseling with the patient and coordination of care, and documentation of the encounter.  Comer Dollar, MD  Division of Gynecologic Oncology  Department of Obstetrics and Gynecology  [facility]

## 2024-05-25 NOTE — Patient Instructions (Signed)
 It was good to see you today.  I do not see or feel any evidence of cancer recurrence on your exam.  I will see you for follow-up in 6 months.  Please keep me posted if skin does not improve with the doxycycline , we can try a yeast medication.  As always, if you develop any new and concerning symptoms before your next visit, please call to see me sooner.

## 2024-05-28 DIAGNOSIS — Z860100 Personal history of colon polyps, unspecified: Secondary | ICD-10-CM | POA: Diagnosis not present

## 2024-05-28 DIAGNOSIS — Z933 Colostomy status: Secondary | ICD-10-CM | POA: Diagnosis not present

## 2024-05-28 DIAGNOSIS — K6389 Other specified diseases of intestine: Secondary | ICD-10-CM | POA: Diagnosis not present

## 2024-05-28 DIAGNOSIS — Z8719 Personal history of other diseases of the digestive system: Secondary | ICD-10-CM | POA: Diagnosis not present

## 2024-06-07 DIAGNOSIS — Z9013 Acquired absence of bilateral breasts and nipples: Secondary | ICD-10-CM | POA: Diagnosis not present

## 2024-06-07 DIAGNOSIS — Z853 Personal history of malignant neoplasm of breast: Secondary | ICD-10-CM | POA: Diagnosis not present

## 2024-06-08 ENCOUNTER — Other Ambulatory Visit: Payer: Self-pay | Admitting: Plastic Surgery

## 2024-06-08 DIAGNOSIS — Z853 Personal history of malignant neoplasm of breast: Secondary | ICD-10-CM

## 2024-06-08 DIAGNOSIS — Z9013 Acquired absence of bilateral breasts and nipples: Secondary | ICD-10-CM

## 2024-06-11 ENCOUNTER — Other Ambulatory Visit: Payer: Self-pay | Admitting: Plastic Surgery

## 2024-06-11 DIAGNOSIS — Z9013 Acquired absence of bilateral breasts and nipples: Secondary | ICD-10-CM

## 2024-06-11 DIAGNOSIS — Z853 Personal history of malignant neoplasm of breast: Secondary | ICD-10-CM

## 2024-06-13 DIAGNOSIS — Z23 Encounter for immunization: Secondary | ICD-10-CM | POA: Diagnosis not present

## 2024-06-14 DIAGNOSIS — Z436 Encounter for attention to other artificial openings of urinary tract: Secondary | ICD-10-CM | POA: Diagnosis not present

## 2024-06-14 DIAGNOSIS — Z8543 Personal history of malignant neoplasm of ovary: Secondary | ICD-10-CM | POA: Diagnosis not present

## 2024-06-14 NOTE — Nursing Note (Signed)
 Pt tolerated procedure well. She received 2 mg versed  and 100 mcg fentanyl . Followup appt made. Report given to [place], RN in post procedure.

## 2024-06-15 ENCOUNTER — Other Ambulatory Visit: Payer: Self-pay | Admitting: Plastic Surgery

## 2024-06-15 ENCOUNTER — Ambulatory Visit
Admission: RE | Admit: 2024-06-15 | Discharge: 2024-06-15 | Disposition: A | Discharge status: Home / Self Care | Source: Ambulatory Visit | Attending: Plastic Surgery | Admitting: Plastic Surgery

## 2024-06-15 ENCOUNTER — Ambulatory Visit (HOSPITAL_COMMUNITY)

## 2024-06-15 DIAGNOSIS — Z9013 Acquired absence of bilateral breasts and nipples: Secondary | ICD-10-CM

## 2024-06-15 DIAGNOSIS — Z853 Personal history of malignant neoplasm of breast: Secondary | ICD-10-CM

## 2024-06-15 DIAGNOSIS — N6323 Unspecified lump in the left breast, lower outer quadrant: Secondary | ICD-10-CM | POA: Diagnosis not present

## 2024-06-15 DIAGNOSIS — N6313 Unspecified lump in the right breast, lower outer quadrant: Secondary | ICD-10-CM | POA: Diagnosis not present

## 2024-06-16 ENCOUNTER — Other Ambulatory Visit: Payer: Self-pay | Admitting: Plastic Surgery

## 2024-06-16 DIAGNOSIS — N6323 Unspecified lump in the left breast, lower outer quadrant: Secondary | ICD-10-CM

## 2024-06-16 DIAGNOSIS — N6313 Unspecified lump in the right breast, lower outer quadrant: Secondary | ICD-10-CM

## 2024-06-20 LAB — BASIC METABOLIC PANEL WITH GFR
BUN: 33 — AB (ref 4–21)
CO2: 25 — AB (ref 13–22)
Chloride: 101 (ref 99–108)
Creatinine: 2.2 — AB (ref 0.5–1.1)
Glucose: 168
Potassium: 4.7 meq/L (ref 3.5–5.1)
Sodium: 137 (ref 137–147)

## 2024-06-20 LAB — COMPREHENSIVE METABOLIC PANEL WITH GFR
Albumin: 4 (ref 3.5–5.0)
Calcium: 9.7 (ref 8.7–10.7)
eGFR: 24

## 2024-06-26 ENCOUNTER — Other Ambulatory Visit: Payer: Self-pay | Admitting: Plastic Surgery

## 2024-06-26 ENCOUNTER — Ambulatory Visit
Admission: RE | Admit: 2024-06-26 | Discharge: 2024-06-26 | Disposition: A | Source: Ambulatory Visit | Attending: Plastic Surgery

## 2024-06-26 ENCOUNTER — Ambulatory Visit
Admission: RE | Admit: 2024-06-26 | Discharge: 2024-06-26 | Disposition: A | Discharge status: Home / Self Care | Source: Ambulatory Visit | Attending: Plastic Surgery | Admitting: Plastic Surgery

## 2024-06-26 DIAGNOSIS — N6313 Unspecified lump in the right breast, lower outer quadrant: Secondary | ICD-10-CM

## 2024-06-26 DIAGNOSIS — N6323 Unspecified lump in the left breast, lower outer quadrant: Secondary | ICD-10-CM

## 2024-06-26 HISTORY — PX: BREAST BIOPSY: SHX20

## 2024-06-27 ENCOUNTER — Encounter: Payer: Self-pay | Admitting: Plastic Surgery

## 2024-06-27 LAB — SURGICAL PATHOLOGY

## 2024-07-06 ENCOUNTER — Encounter: Payer: Self-pay | Admitting: Family Medicine

## 2024-07-12 ENCOUNTER — Telehealth: Payer: Self-pay

## 2024-07-12 ENCOUNTER — Other Ambulatory Visit (HOSPITAL_COMMUNITY): Payer: Self-pay

## 2024-07-12 NOTE — Telephone Encounter (Signed)
 Oral Oncology Patient Advocate Encounter   Received notification that prior authorization for NIVESTYM  480 MCG/0.8ML SOSY injection  is required.   PA submitted on 07/12/24 Key [id number] Status is pending     Lucie Lamer, CPhT [place]  [facility] Specialty Pharmacy Services Oncology Pharmacy Patient Advocate Specialist II THERESSA [place] Phone: [phone]  Fax: [phone] [email or web address]

## 2024-07-13 ENCOUNTER — Other Ambulatory Visit (HOSPITAL_COMMUNITY): Payer: Self-pay

## 2024-07-13 NOTE — Telephone Encounter (Signed)
 Oral Oncology Patient Advocate Encounter  I followed up with [employer] insurance on PA  for NIVESTYM  480 MCG/0.8ML SOSY injection.  [employer] P# [phone] OPT 5   Case# [id number], Gave Clinical info over the phone 07/13/2024  Status: Pending up to 14 days for approval    Charlott Hamilton,  CPhT-Adv  she/her/hers [facility]  [facility] Specialty Pharmacy Services Pharmacy Technician Patient Advocate Specialist III [facility] Phone: [phone]  Fax: [phone] [email or web address]

## 2024-07-16 ENCOUNTER — Other Ambulatory Visit (HOSPITAL_COMMUNITY): Payer: Self-pay

## 2024-07-16 NOTE — Telephone Encounter (Addendum)
 Oral Oncology Patient Advocate Encounter  Prior Authorization for NIVESTYM  480 MCG/0.8ML SOSY injection  has been approved.    PA# Case# [id number],  Effective dates: 07/16/2024 through 01/11/2025  Patients co-pay is $0.00.   Patient has been notified via MyChart   Charlott Hamilton,  CPhT-Adv  she/her/hers [facility]  [facility] Specialty Pharmacy Services Pharmacy Technician Patient Advocate Specialist III [facility] Phone: [phone]  Fax: [phone] [email or web address]

## 2024-07-17 NOTE — Progress Notes (Signed)
 Received a call from husband that PA for Nivestym  480 mcg/0.8 ml injection is needed. Forwarded a message to PA team.

## 2024-07-19 LAB — HM DIABETES EYE EXAM

## 2024-07-25 ENCOUNTER — Other Ambulatory Visit: Payer: Self-pay

## 2024-07-25 NOTE — Telephone Encounter (Signed)
 Oral Oncology Patient Advocate Encounter  Relayed the prior authorization information to the pharmacy responsible for dispensing the patients Nivestym  480 mcg/0.8 mL.  Prior authorization for Nivestym  480 mcg/0.8 mL SOSY injection has been approved.  PA/Case #: [id number]  Effective dates: 07/16/2024 - 01/11/2025  Patient co-pay: $0.00  The patient has been notified via MyChart and by phone. Spoke with the patients husband on 07/17/2024 at 4:05 PM.

## 2024-07-28 ENCOUNTER — Other Ambulatory Visit: Payer: Self-pay | Admitting: Family Medicine

## 2024-07-28 ENCOUNTER — Other Ambulatory Visit (INDEPENDENT_AMBULATORY_CARE_PROVIDER_SITE_OTHER): Payer: Self-pay | Admitting: Family Medicine

## 2024-07-28 DIAGNOSIS — G8929 Other chronic pain: Secondary | ICD-10-CM

## 2024-07-30 NOTE — Telephone Encounter (Signed)
 Requesting:  Lyrica  50mg   Contract: None UDS: None Last Visit: 02/23/24 Next Visit: 08/28/24 Last Refill: 02/23/24 #180 and 1RF   Please Advise

## 2024-08-01 ENCOUNTER — Other Ambulatory Visit (INDEPENDENT_AMBULATORY_CARE_PROVIDER_SITE_OTHER): Payer: Self-pay | Admitting: Family Medicine

## 2024-08-01 ENCOUNTER — Encounter: Payer: Self-pay | Admitting: Family Medicine

## 2024-08-01 ENCOUNTER — Encounter (INDEPENDENT_AMBULATORY_CARE_PROVIDER_SITE_OTHER): Payer: Self-pay | Admitting: Family Medicine

## 2024-08-01 MED ORDER — MOUNJARO 2.5 MG/0.5ML ~~LOC~~ SOAJ: 2.5000 mg | SUBCUTANEOUS | 0 refills | Status: DC

## 2024-08-04 DIAGNOSIS — B029 Zoster without complications: Secondary | ICD-10-CM

## 2024-08-04 HISTORY — DX: Zoster without complications: B02.9

## 2024-08-09 ENCOUNTER — Ambulatory Visit (INDEPENDENT_AMBULATORY_CARE_PROVIDER_SITE_OTHER): Payer: Self-pay | Admitting: Family Medicine

## 2024-08-13 ENCOUNTER — Encounter: Payer: Self-pay | Admitting: *Deleted

## 2024-08-13 NOTE — Progress Notes (Signed)
 [name]                                          MRN: [id number]   08/13/2024   The VBCI Quality Team Specialist reviewed this patient medical record for the purposes of chart review for care gap closure. The following were reviewed: chart review for care gap closure-controlling blood pressure.    VBCI Quality Team

## 2024-08-27 ENCOUNTER — Telehealth: Payer: Self-pay

## 2024-08-27 NOTE — Telephone Encounter (Signed)
 Initial Comment Caller states they just came from [place] on a direct flight . The patient has a 103.1 fever . She has just got done taking Doxycycline  for a another issue. She is also taking a medication for shingles in her eye. She also takes steroid eye drops . She does have kidney disease PCP: Dr . Cyndee Translation No Nurse Assessment Nurse: Files, RN, Celestine Date/Time (Eastern Time): 08/27/2024 2:53:23 AM Confirm and document reason for call. If symptomatic, describe symptoms. ---She has a fistula on doxycline 2 weeks, it gets infected. She has double ostomy. 101.1 went to 103.1 FH then came back down. She thinks she broke. Has shingles in her eye and has 1% steroid drop for that and on acyclovir. Fistula is still draining. runny nose and coughing yesterday that has gone. Does the patient have any new or worsening symptoms? ---Yes Will a triage be completed? ---Yes Related visit to physician within the last 2 weeks? ---No Does the PT have any chronic conditions? (i.e. diabetes, asthma, this includes High risk factors for pregnancy, etc.) ---Yes List chronic conditions. ---DM, endstage renal. Is this a behavioral health or substance abuse call? ---No PLEASE NOTE: All timestamps contained within this report are represented as Eastern Standard Time. CONFIDENTIALTY NOTICE: This fax transmission is intended only for the addressee. It contains information that is legally privileged, confidential or otherwise protected from use or disclosure. If you are not the intended recipient, you are strictly prohibited from reviewing, disclosing, copying using or disseminating any of this information or taking any action in reliance on or regarding this information. If you have received this fax in error, please notify us  immediately by telephone so that we can arrange for its return to us . Phone: [phone], Toll-Free: [phone], Fax: [phone] PAULA_HOEDER [birth year] Page: 1 of2 CallId:  [id number] Guidelines Guideline Title Affirmed Question Affirmed Notes Nurse Date/Time Titus Time) Infection on Antibiotic Follow-up Call Fever > 103 F (39.4 C) Files, RN, Celestine 08/27/2024 2:58:19 AM Disp. Time Titus Time) Disposition Final User 08/27/2024 12:06:50 AM Send to Clinical Louis Scarce, RN, Sarah 08/27/2024 2:25:18 AM Send To Clinical Follow Up Vallery Mau, RN, Leita 08/27/2024 3:02:37 AM Go to ED/UCC Now (or PCP triage) Yes Files, RN, Celestine Final Disposition 08/27/2024 3:02:37 AM Go to ED/UCC Now (or PCP triage) Yes Files, RN, Celestine [place] Disagree/Comply Disagree Caller Understands Yes PreDisposition InappropriateToAsk Care Advice Given Per Guideline GO TO ED/UCC NOW (OR PCP TRIAGE): * IF NO PCP (PRIMARY CARE PROVIDER) SECOND-LEVEL TRIAGE: You need to be seen within the next hour. Go to the ED/UCC at _____________ Hospital. Leave as soon as you can. CAUTION: See Sources of Care below when considering where to send the patient. CARE ADVICE given per Infection on Antibiotic Follow-up Call (Adult) guideline. Comments User: Celestine Aver, RN Date/Time Titus Time): 08/27/2024 3:02:32 AM Caller is going to see if she has another fever pop, she is going to call tomorrow to get an her appointment moved up in the office. Referrals GO TO FACILITY REFUSED

## 2024-08-28 ENCOUNTER — Ambulatory Visit: Admitting: Family Medicine

## 2024-08-30 ENCOUNTER — Encounter (INDEPENDENT_AMBULATORY_CARE_PROVIDER_SITE_OTHER): Payer: Self-pay | Admitting: Family Medicine

## 2024-08-30 ENCOUNTER — Ambulatory Visit (INDEPENDENT_AMBULATORY_CARE_PROVIDER_SITE_OTHER): Admitting: Family Medicine

## 2024-08-30 VITALS — BP 129/73 | HR 67 | Temp 97.3°F | Ht 61.0 in | Wt 185.0 lb

## 2024-08-30 DIAGNOSIS — E669 Obesity, unspecified: Secondary | ICD-10-CM

## 2024-08-30 DIAGNOSIS — Z6834 Body mass index (BMI) 34.0-34.9, adult: Secondary | ICD-10-CM

## 2024-08-30 DIAGNOSIS — Z7985 Long-term (current) use of injectable non-insulin antidiabetic drugs: Secondary | ICD-10-CM | POA: Diagnosis not present

## 2024-08-30 DIAGNOSIS — E1122 Type 2 diabetes mellitus with diabetic chronic kidney disease: Secondary | ICD-10-CM | POA: Diagnosis not present

## 2024-08-30 DIAGNOSIS — F3289 Other specified depressive episodes: Secondary | ICD-10-CM

## 2024-08-30 DIAGNOSIS — N184 Chronic kidney disease, stage 4 (severe): Secondary | ICD-10-CM

## 2024-08-30 DIAGNOSIS — F5089 Other specified eating disorder: Secondary | ICD-10-CM | POA: Diagnosis not present

## 2024-08-30 MED ORDER — BUPROPION HCL 100 MG PO TABS: 100.0000 mg | ORAL_TABLET | Freq: Two times a day (BID) | ORAL | 0 refills | Status: AC

## 2024-08-30 MED ORDER — MOUNJARO 2.5 MG/0.5ML ~~LOC~~ SOAJ: 2.5000 mg | SUBCUTANEOUS | 0 refills | Status: AC

## 2024-08-30 NOTE — Progress Notes (Signed)
 "  Office: [phone]  /  Fax: [phone]  WEIGHT SUMMARY AND BIOMETRICS  Anthropometric Measurements Height: 5' 1 (1.549 m) Weight: 185 lb (83.9 kg) BMI (Calculated): 34.97 Weight at Last Visit: 189 lb Weight Lost Since Last Visit: 4 lb Weight Gained Since Last Visit: 0 Starting Weight: 208 lb Total Weight Loss (lbs): 19 lb (8.618 kg) Peak Weight: 300 lb   Body Composition  Body Fat %: 47.6 % Fat Mass (lbs): 88.2 lbs Muscle Mass (lbs): 92 lbs Total Body Water  (lbs): 70.2 lbs Visceral Fat Rating : 15   Other Clinical Data Fasting: no Labs: no Today's Visit #: 80 Starting Date: 04/05/18    Chief Complaint: OBESITY   History of Present Illness [name] is a 69 year old female with obesity and type 2 diabetes who presents for a follow-up on her obesity treatment and progress.  She adheres to a dietary plan of 1300 calories with a goal of 80 grams of protein daily, achieving this about 60% of the time. She focuses on consuming more whole foods, including fruits and vegetables, and aims to meet her protein intake goals. Over the past three months, she has lost four pounds. During a recent trip to [place], she incorporated walking into her exercise routine.  In managing her type 2 diabetes, she is reducing simple carbohydrates, increasing exercise, and concentrating on weight loss. She is currently on Mounjaro  2.5 mg and requests a refill. She notes that while this dose does not control her appetite as effectively as the 5 mg dose, it is better tolerated gastrointestinally.  She is addressing emotional eating behaviors with Wellbutrin  100 mg twice a day, which she reports as stable and requests a refill.  She experienced shingles in her eye just before a trip, necessitating a visit to a retinologist in [place]. An angiogram-type test ruled out retinal necrosis, and blood flow to both retinas was normal. She had chickenpox as a child and is  immunocompromised.  She reports a neck injury sustained after a procedure in [place], exacerbated by sedation, resulting in significant neck pain. She reports that an x-ray was performed at [facility] and that she was told there was a change in the curve of her cervical spine. She experiences severe headaches and has been taking Tylenol  and a muscle relaxant. She describes having no strength to hold up her head and has been miserable with the pain.  She has a history of neck pain issues and has had nerve ablations recently. She describes the pain as running from the back of her head, through her collarbone, and sometimes to her back.      PHYSICAL EXAM:  Blood pressure 129/73, pulse 67, temperature (!) 97.3 F (36.3 C), height 5' 1 (1.549 m), weight 185 lb (83.9 kg), SpO2 97%. Body mass index is 34.96 kg/m.  DIAGNOSTIC DATA REVIEWED BY MYSELF TODAY:  BMET    Component Value Date/Time   NA 137 06/20/2024 0000   NA 141 01/24/2017 1228   K 4.7 06/20/2024 0000   K 4.6 01/24/2017 1228   CL 101 06/20/2024 0000   CO2 25 (A) 06/20/2024 0000   CO2 29 01/24/2017 1228   GLUCOSE 126 (H) 02/23/2024 0941   GLUCOSE 84 01/24/2017 1228   BUN 33 (A) 06/20/2024 0000   BUN 22.2 01/24/2017 1228   CREATININE 2.2 (A) 06/20/2024 0000   CREATININE 2.02 (H) 02/23/2024 0941   CREATININE 2.20 (H) 06/04/2022 1450   CREATININE 1.91 (H) 01/18/2020 1440  CREATININE 0.8 01/24/2017 1228   CALCIUM  9.7 06/20/2024 0000   CALCIUM  10.2 01/24/2017 1228   GFRNONAA 24 (L) 06/04/2022 1450   GFRNONAA 78 12/16/2014 1530   GFRAA 38 (L) 06/16/2020 0834   GFRAA 30 (L) 03/27/2018 1324   GFRAA >89 12/16/2014 1530   Lab Results  Component Value Date   HGBA1C 6.8 (H) 02/23/2024   HGBA1C 6.3 (H) 11/07/2014   Lab Results  Component Value Date   INSULIN  14.1 06/16/2020   Lab Results  Component Value Date   TSH 0.39 02/23/2024   CBC    Component Value Date/Time   WBC 5.5 02/23/2024 0941   RBC 4.37  02/23/2024 0941   HGB 12.6 02/23/2024 0941   HGB 11.3 (L) 06/04/2022 1450   HGB 11.5 01/11/2022 1544   HGB 12.4 07/29/2017 1444   HCT 38.6 02/23/2024 0941   HCT 35.5 01/11/2022 1544   HCT 38.0 07/29/2017 1444   PLT 287.0 02/23/2024 0941   PLT 184 06/04/2022 1450   PLT 207 01/11/2022 1544   MCV 88.4 02/23/2024 0941   MCV 94 01/11/2022 1544   MCV 90.9 07/29/2017 1444   MCH 29.6 10/06/2023 1112   MCHC 32.7 02/23/2024 0941   RDW 15.9 (H) 02/23/2024 0941   RDW 13.1 01/11/2022 1544   RDW 15.5 (H) 07/29/2017 1444   Iron  Studies    Component Value Date/Time   IRON  7 (L) 08/31/2017 0451   IRON  12 (L) 11/29/2014 1251   TIBC 164 (L) 08/31/2017 0451   TIBC 331 11/29/2014 1251   FERRITIN 27 11/29/2014 1251   IRONPCTSAT 4 (L) 08/31/2017 0451   IRONPCTSAT 4 (L) 11/29/2014 1251   Lipid Panel     Component Value Date/Time   CHOL 114 02/23/2024 0941   CHOL 92 (L) 06/16/2020 0834   TRIG 166.0 (H) 02/23/2024 0941   HDL 48.90 02/23/2024 0941   HDL 49 06/16/2020 0834   CHOLHDL 2 02/23/2024 0941   VLDL 33.2 02/23/2024 0941   LDLCALC 32 02/23/2024 0941   LDLCALC 21 06/16/2020 0834   LDLCALC 13 01/18/2020 1440   Hepatic Function Panel     Component Value Date/Time   PROT 8.1 02/23/2024 0941   PROT 7.8 06/16/2020 0834   PROT 6.9 01/24/2017 1228   ALBUMIN  4.0 06/20/2024 0000   ALBUMIN  4.4 06/16/2020 0834   ALBUMIN  3.4 (L) 01/24/2017 1228   AST 15 02/23/2024 0941   AST 12 (L) 06/04/2022 1450   AST 16 01/24/2017 1228   ALT 33 02/23/2024 0941   ALT 14 06/04/2022 1450   ALT 14 01/24/2017 1228   ALKPHOS 232 (H) 02/23/2024 0941   ALKPHOS 90 01/24/2017 1228   BILITOT 0.3 02/23/2024 0941   BILITOT 0.6 06/04/2022 1450   BILITOT 0.34 01/24/2017 1228      Component Value Date/Time   TSH 0.39 02/23/2024 0941   Nutritional Lab Results  Component Value Date   VD25OH 47.1 05/10/2024   VD25OH 44.37 08/22/2023   VD25OH 36.40 02/17/2023     Assessment and Plan Assessment &  Plan Obesity Management includes a dietary plan of 1300 calories and 80 or more grams of protein, which she meets about 60% of the time. She has lost 4 pounds over the last three months. She is working on eating more whole foods such as fruits and vegetables and has been traveling to [place], engaging in walking as part of her exercise. - Continue current dietary plan of 1300 calories and 80 or more grams of protein. -  Encouraged increased intake of whole foods such as fruits and vegetables. - Encouraged regular physical activity as tolerated.  Type 2 diabetes mellitus with stage 4 chronic kidney disease Type 2 diabetes is managed with Mounjaro  2.5 mg. She is working on decreasing simple carbohydrates, increasing exercise, and weight loss. She reports that Mounjaro  does not control her appetite as well as the 5 mg dose did, but it agrees better with her gut. She has stage 4 chronic kidney disease, which was considered during a recent angiogram in [place]. - Continue Mounjaro  2.5 mg. - Refilled Mounjaro  for 90 days. - Encouraged continued efforts to decrease simple carbohydrates and increase exercise.  Depression with emotional eating behaviors Depression with emotional eating behaviors is managed with Wellbutrin  100 mg twice a day. She is stable on this regimen and requests a refill. - Continue Wellbutrin  100 mg twice a day. - Refilled Wellbutrin  prescription.      Patients who are on anti-obesity medications are counseled on the importance of maintaining healthy lifestyle habits, including balanced nutrition, regular physical activity, and behavioral modifications,  Medication is an adjunct to, not a replacement for, lifestyle changes and that the long-term success and weight maintenance depend on continued adherence to these strategies.   Dessa was informed of the importance of frequent follow up visits to maximize her success with intensive lifestyle modifications for her obesity and  obesity related health conditions as recommended by USPSTF and CMS guidelines  Louann Penton, MD   "

## 2024-08-31 ENCOUNTER — Ambulatory Visit: Admitting: *Deleted

## 2024-08-31 ENCOUNTER — Telehealth: Payer: Self-pay | Admitting: *Deleted

## 2024-08-31 VITALS — Ht 61.0 in | Wt 176.0 lb

## 2024-08-31 DIAGNOSIS — Z0000 Encounter for general adult medical examination without abnormal findings: Secondary | ICD-10-CM | POA: Diagnosis not present

## 2024-08-31 DIAGNOSIS — Z780 Asymptomatic menopausal state: Secondary | ICD-10-CM | POA: Diagnosis not present

## 2024-08-31 NOTE — Progress Notes (Signed)
 " Please attest this visit in the absence of patient primary care provider.   Chief Complaint  Patient presents with   Medicare Wellness     Subjective:   [name] is a 69 y.o. female who presents for a Medicare Annual Wellness Visit.  Visit info / Clinical Intake: Medicare Wellness Visit Type:: Initial Annual Wellness Visit Persons participating in visit and providing information:: patient Medicare Wellness Visit Mode:: Telephone If telephone:: video declined Since this visit was completed virtually, some vitals may be partially provided or unavailable. Missing vitals are due to the limitations of the virtual format.: Unable to obtain vitals - no equipment If Telephone or Video please confirm:: I connected with patient using audio/video enable telemedicine. I verified patient identity with two identifiers, discussed telehealth limitations, and patient agreed to proceed. Patient Location:: home Provider Location:: office Interpreter Needed?: No Pre-visit prep was completed: yes AWV questionnaire completed by patient prior to visit?: yes Date:: 08/30/24 Living arrangements:: (Patient-Rptd) lives with spouse/significant other Patient's Overall Health Status Rating: (Patient-Rptd) good Typical amount of pain: (Patient-Rptd) some Does pain affect daily life?: (Patient-Rptd) no Are you currently prescribed opioids?: no  Dietary Habits and Nutritional Risks How many meals a day?: (Patient-Rptd) 3 Eats fruit and vegetables daily?: (Patient-Rptd) yes Most meals are obtained by: (Patient-Rptd) preparing own meals In the last 2 weeks, have you had any of the following?: none (has colostomy) Diabetic:: (!) yes Any non-healing wounds?: no How often do you check your BS?: continuous glucose monitor Would you like to be referred to a Nutritionist or for Diabetic Management? : no  Functional Status Activities of Daily Living (to include ambulation/medication): (Patient-Rptd)  Independent Ambulation: Independent with device- listed below Home Assistive Devices/Equipment: Cane (if walking long distances) Medication Administration: (Patient-Rptd) Independent Home Management (perform basic housework or laundry): Needs assistance (comment) (has housekeeper) Manage your own finances?: (!) (Patient-Rptd) no Primary transportation is: (Patient-Rptd) driving Concerns about vision?: no *vision screening is required for WT[facility]* (up to date with Dr Charmayne / [facility], Dr [facility]) Concerns about hearing?: (Patient-Rptd) no  Fall Screening Falls in the past year?: (Patient-Rptd) 0 Number of falls in past year: 0 Was there an injury with Fall?: 0 Fall Risk Category Calculator: 0 Patient Fall Risk Level: Low Fall Risk  Fall Risk Patient at Risk for Falls Due to: No Fall Risks Fall risk Follow up: Falls evaluation completed  Home and Transportation Safety: All rugs have non-skid backing?: (Patient-Rptd) yes All stairs or steps have railings?: (Patient-Rptd) yes Grab bars in the bathtub or shower?: (Patient-Rptd) yes Have non-skid surface in bathtub or shower?: (!) (Patient-Rptd) no Good home lighting?: (Patient-Rptd) yes Regular seat belt use?: (Patient-Rptd) yes Hospital stays in the last year:: (Patient-Rptd) no  Cognitive Assessment Difficulty concentrating, remembering, or making decisions? : (Patient-Rptd) no Will 6CIT or Mini Cog be Completed: yes What year is it?: 0 points What month is it?: 0 points Give patient an address phrase to remember (5 components): [address] [place]  [place]  About what time is it?: 0 points Count backwards from 20 to 1: 0 points Say the months of the year in reverse: 2 points Repeat the address phrase from earlier: 0 points 6 CIT Score: 2 points  Advance Directives (For Healthcare) Does Patient Have a Medical Advance Directive?: Yes Does patient want to make changes to medical advance  directive?: No - Patient declined Type of Advance Directive: Healthcare Power of [place]; Living will (declines to bring copy) Copy of  Healthcare Power of Attorney in Chart?: No - copy requested Copy of Living Will in Chart?: No - copy requested  Reviewed/Updated  Reviewed/Updated: Reviewed All (Medical, Surgical, Family, Medications, Allergies, Care Teams, Patient Goals)    Allergies (verified) Penicillins, Erythromycin, Tape, Trimethoprim, Ultram  [tramadol ], Zarxio  [filgrastim ], Cephalosporins, Fluconazole , Neomycin , Oxycodone , Pectin, Septra [sulfamethoxazole-trimethoprim], and Sulfa antibiotics   Current Medications (verified) Outpatient Encounter Medications as of 08/31/2024  Medication Sig   aspirin  EC 81 MG tablet Take 1 tablet (81 mg total) by mouth daily. Swallow whole.   Biotin  5 MG TABS Take 5 mg by mouth daily.    buPROPion  (WELLBUTRIN ) 100 MG tablet Take 1 tablet (100 mg total) by mouth 2 (two) times daily.   calcium  citrate-vitamin D  (CITRACAL+D) 315-200 MG-UNIT tablet Take 1 tablet by mouth daily.   Cholecalciferol  (VITAMIN D3) 10000 UNITS capsule Take 10,000 Units by mouth See admin instructions. Take 1 tablet by mouth 43times per week.   ciclopirox  (PENLAC ) 8 % solution APPLY TOPICALLY AT BEDTIME. APPLY OVER NAIL AND SURROUNDING SKIN. APPLY DAILY OVER PREVIOUS COAT. AFTER SEVEN (7) DAYS, MAY REMOVE WITH ALCOHOL  AND CONTINUE CYCLE.   ciclopirox  (PENLAC ) 8 % solution Apply topically at bedtime. Apply over nail and surrounding skin. Apply daily over previous coat. After seven (7) days, may remove with alcohol  and continue cycle.   clobetasol  (OLUX ) 0.05 % topical foam Apply topically 2 (two) times daily.   Continuous Blood Gluc Sensor (FREESTYLE LIBRE 3 SENSOR) MISC Inject 1 sensor to the skin every 14 days for continuous glucose monitoring.   Cranberry-Vitamin C  15000-100 MG CAPS Take by mouth daily. 1 in Morning and 1 in PM   famotidine -calcium  carbonate-magnesium  hydroxide  (PEPCID  COMPLETE) 10-800-165 MG chewable tablet Chew 1 tablet by mouth as needed.   ferrous sulfate  325 (65 FE) MG tablet Take 1 tablet (325 mg total) by mouth at bedtime.   fluticasone  (FLONASE ) 50 MCG/ACT nasal spray Place 2 sprays into both nostrils daily.   ibuprofen  (ADVIL ) 800 MG tablet Take 1 tablet (800 mg total) by mouth every 6 (six) hours as needed.   levothyroxine  (SYNTHROID , LEVOTHROID) 150 MCG tablet Take 1 tablet (150 mcg total) by mouth daily before breakfast.   loratadine  (CLARITIN ) 10 MG tablet Take 10 mg by mouth daily.    moxifloxacin  (VIGAMOX ) 0.5 % ophthalmic solution Place 1 drop into both eyes 3 (three) times daily.   NIVESTYM  480 MCG/0.8ML SOSY injection INJECT THE CONTENTS OF ONE 0.8 ML SYRINGE (480 MCG) INTO THE SKIN EVERY 7 DAYS   omega-3 acid ethyl esters (LOVAZA ) 1 G capsule Take 1 g by mouth 2 (two) times daily.    Polyethyl Glycol-Propyl Glycol (SYSTANE OP) Place 1 drop into both eyes daily as needed (dry eyes).    pregabalin  (LYRICA ) 50 MG capsule Take 1 capsule by mouth twice daily.   Prenatal Vit-Fe Fumarate-FA (PRENATAL VITAMIN PO) Take 1 capsule by mouth daily. Takes prenatal because there are no dyes in it   Probiotic Product (ALIGN PO) Take 1 capsule by mouth daily.   rosuvastatin  (CRESTOR ) 10 MG tablet Take 10 mg by mouth every evening.   Saccharomyces boulardii (FLORASTOR PO) Take 1 capsule by mouth at bedtime.   senna (SENOKOT) 8.6 MG tablet Take 1 tablet (8.6 mg total) by mouth daily.   Tazarotene  (ARAZLO ) 0.045 % LOTN Apply 1 application topically daily.   tirzepatide  (MOUNJARO ) 2.5 MG/0.5ML Pen Inject 2.5 mg into the skin once a week.   tiZANidine  (ZANAFLEX ) 4 MG tablet Take 0.5-1 tablets (  2-4 mg total) by mouth every 8 (eight) hours as needed for muscle spasms.   UNABLE TO FIND 4 mg every day by oral route.   XIFAXAN 550 MG TABS tablet Take 550 mg by mouth as needed.   No facility-administered encounter medications on file as of 08/31/2024.     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   oncologist-- dr rossi/ dr gorsuch:/   dx endometroid endometrial & ovarian cancer s/p  chemotheapy and surgery(TAH w/ BSO) :  recurrent 11-19-2014 post pelvic surgery and radiation 01-29-2015 to 03-10-2015   Chronic idiopathic neutropenia    presumed related to chemotherapy March 2006--- followed by dr lonn (treatment w/ G-CSF injections   Chronic nausea    CKD (chronic kidney disease), stage III [facility])    nephrologist-  dr almarie bonine   Difficult intravenous access    small veins--- hx PICC lines   DM type 2 (diabetes mellitus, type 2) (HCC)    endocriologist--  dr norman sebastian   (08-18-2021  per pt checks blood sugar daily in am,  fasting sugar--92--130)   Dysrhythmia    Environmental and seasonal allergies    Generalized muscle weakness    GERD (gastroesophageal reflux disease)    Hiatal hernia    History of abdominal abscess 04/16/2017   post surgery 04-01-2017  --- resolved 10/ 2018   History of COVID-19 05/2021   per pt mild symptoms that resolved   History of gastric polyp    2014  duodenum   History of ileus 04/16/2017   resolved w/ no surgical intervention   History of radiation therapy    01-29-2015 to 03-10-2015  pelvis 50.4Gy   History of small bowel obstruction 01/2019   Hypothyroidism    monitored by dr ozell altheimer   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 Eletha   PAF  (paroxysmal atrial fibrillation) [facility])    cardiologist--- dr h. 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   Vitamin D  deficiency    Vulval lesion    Wears glasses    Past Surgical History:  Procedure Laterality Date   APPENDECTOMY     BREAST BIOPSY Left 06/26/2024   US  LT BREAST BX W LOC DEV 1ST LESION IMG BX SPEC US  GUIDE 06/26/2024 [facility]   BREAST BIOPSY Right 06/26/2024   US  RT BREAST BX W LOC DEV 1ST LESION IMG BX SPEC US  GUIDE 06/26/2024 [facility]   BREAST RECONSTRUCTION WITH PLACEMENT OF TISSUE EXPANDER AND FLEX HD (ACELLULAR HYDRATED DERMIS) Bilateral 12/11/2015   Procedure: BILATERAL BREAST RECONSTRUCTION WITH  PLACEMENT OF TISSUE EXPANDERS;  Surgeon: Earlis Ranks, MD;  [facility]: [facility];  Service: Plastics;  Laterality: Bilateral;   COLONOSCOPY WITH PROPOFOL  N/A 08/21/2013   Procedure: COLONOSCOPY WITH PROPOFOL ;  Surgeon: Lamar LULLA Bunk, MD;  [facility];  Service: Endoscopy;  Laterality: N/A;   COLOSTOMY TAKEDOWN N/A 12/04/2014   Procedure: LAPROSCOPIC LYSIS OF ADHESIONS, SPLENIC MOBILIZATION, RELOCATION OF COLOSTOMY, DEBRIDEMENT INITIAL COLOSTOMY SITE;  Surgeon: Elspeth Schultze, MD;  [facility]: [facility];  Service: General;  Laterality: N/A;   CYSTECTOMY W/ URETEROILEAL CONDUIT  04/12/2019   @[facility] ;   INTESTINAL ANASTOMOSIS, PANNICULECTOMY, PELVIS EXENTENTION,  PARTIAL COLECTOMY REMOVAL ILEIUM WITH PERMNANT ILEOCOLOSTOMY   CYSTOGRAM N/A 06/01/2017   Procedure: CYSTOGRAM;  Surgeon: Alvaro Hummer, MD;  [facility];  Service: Urology;  Laterality: N/A;   CYSTOSCOPY W/ RETROGRADES Right 11/21/2015   Procedure:  CYSTOSCOPY WITH RETROGRADE PYELOGRAM;  Surgeon: Hummer Alvaro, MD;  [facility];  Service: Urology;  Laterality: Right;   CYSTOSCOPY W/ URETERAL STENT PLACEMENT Right 11/21/2015   Procedure: CYSTOSCOPY WITH STENT REPLACEMENT;  Surgeon: Hummer Alvaro, MD;  [facility];  Service: Urology;  Laterality: Right;   CYSTOSCOPY W/ URETERAL STENT PLACEMENT Right 03/10/2016   Procedure: CYSTOSCOPY WITH STENT REPLACEMENT;  Surgeon: Hummer Alvaro, MD;  Location: [facility];  Service: Urology;  Laterality: Right;   CYSTOSCOPY W/ URETERAL STENT PLACEMENT Right 06/30/2016   Procedure: CYSTOSCOPY WITH RETROGRADE PYELOGRAM/URETERAL STENT EXCHANGE;  Surgeon: Hummer Alvaro, MD;  Location: [facility];  Service: Urology;  Laterality: Right;   CYSTOSCOPY W/ URETERAL STENT PLACEMENT N/A 06/01/2017   Procedure: CYSTOSCOPY WITH EXAM UNDER ANESTHESIA;  Surgeon: Alvaro Hummer, MD;  [facility];  Service: Urology;  Laterality: N/A;   CYSTOSCOPY W/ URETERAL STENT PLACEMENT Right 08/17/2017   Procedure: CYSTOSCOPY WITH RETROGRADE PYELOGRAM/URETERAL STENT REMOVAL;  Surgeon: Alvaro Hummer, MD;  Location: [facility];  Service: Urology;  Laterality: Right;   CYSTOSCOPY WITH RETROGRADE PYELOGRAM, URETEROSCOPY AND STENT PLACEMENT Right 03/20/2015   Procedure: CYSTOSCOPY WITH RETROGRADE PYELOGRAM, URETEROSCOPY WITH BALLOON DILATION AND STENT PLACEMENT ON RIGHT;  Surgeon: Hummer Alvaro, MD;  Location: [facility];  Service: Urology;  Laterality: Right;   CYSTOSCOPY WITH RETROGRADE PYELOGRAM, URETEROSCOPY AND STENT PLACEMENT Right 05/02/2015   Procedure: CYSTOSCOPY WITH RIGHT RETROGRADE PYELOGRAM,  DIAGNOSTIC URETEROSCOPY AND STENT PULL ;  Surgeon: Hummer Alvaro, MD;  Location: [facility];  Service: Urology;  Laterality: Right;   CYSTOSCOPY WITH RETROGRADE PYELOGRAM, URETEROSCOPY AND STENT PLACEMENT Right 09/05/2015   Procedure:  CYSTOSCOPY WITH RETROGRADE PYELOGRAM,  AND STENT PLACEMENT;  Surgeon: Hummer Alvaro, MD;  [facility];  Service: Urology;  Laterality: Right;   CYSTOSCOPY WITH RETROGRADE PYELOGRAM, URETEROSCOPY AND STENT PLACEMENT Right 04/01/2017   Procedure: CYSTOSCOPY WITH RETROGRADE PYELOGRAM, URETEROSCOPY AND STENT PLACEMENT;  Surgeon: Alvaro Hummer, MD;  [facility];  Service: Urology;  Laterality: Right;   CYSTOSCOPY WITH STENT PLACEMENT Right 10/27/2016   Procedure: CYSTOSCOPY WITH STENT CHANGE and right retrograde pyelogram;  Surgeon: Hummer Alvaro, MD;  Location[facility];  Service: Urology;  Laterality: Right;   EUS N/A 10/02/2014   Procedure: LOWER ENDOSCOPIC ULTRASOUND (EUS);  Surgeon: Elsie Cree, MD;  Location: [facility];  Service: Endoscopy;  Laterality: N/A;   EXCISION SOFT TISSUE MASS RIGHT FOREMAN  12/08/2006   EYE SURGERY  as child   pytosis of eyelids repair   FOOT SURGERY  10/2023   Left   INCISION AND DRAINAGE OF WOUND Bilateral  12/26/2015   Procedure: DEBRIDEMENT OF BILATERAL MASTECTOMY FLAPS;  Surgeon: Earlis Ranks, MD;  Location: [facility];  Service: Plastics;  Laterality: Bilateral;   IR CV LINE INJECTION  05/31/2017   IR FLUORO GUIDE CV LINE LEFT  05/31/2017   IR FLUORO GUIDE CV LINE RIGHT  04/06/2017   IR FLUORO GUIDE CV MIDLINE PICC RIGHT  05/30/2017   IR NEPHROSTOGRAM LEFT INITIAL PLACEMENT  09/02/2017   IR NEPHROSTOGRAM LEFT THRU EXISTING ACCESS  11/29/2017   IR NEPHROSTOGRAM RIGHT INITIAL PLACEMENT  09/02/2017   IR NEPHROSTOGRAM RIGHT THRU EXISTING ACCESS  09/13/2017   IR NEPHROSTOGRAM RIGHT THRU EXISTING ACCESS  11/29/2017   IR NEPHROSTOMY EXCHANGE LEFT  11/28/2017   IR NEPHROSTOMY EXCHANGE LEFT  01/05/2018   IR NEPHROSTOMY EXCHANGE LEFT  02/16/2018   IR NEPHROSTOMY EXCHANGE LEFT  03/30/2018   IR NEPHROSTOMY EXCHANGE LEFT  05/12/2018   IR NEPHROSTOMY EXCHANGE LEFT  06/21/2018   IR NEPHROSTOMY EXCHANGE LEFT   08/04/2018   IR NEPHROSTOMY EXCHANGE LEFT  09/18/2018   IR NEPHROSTOMY EXCHANGE LEFT  10/09/2018   IR NEPHROSTOMY EXCHANGE LEFT  10/27/2018   IR NEPHROSTOMY EXCHANGE LEFT  11/21/2018   IR NEPHROSTOMY EXCHANGE LEFT  01/05/2019   IR NEPHROSTOMY EXCHANGE LEFT  02/15/2019   IR NEPHROSTOMY EXCHANGE LEFT  03/29/2019   IR NEPHROSTOMY EXCHANGE RIGHT  10/02/2017   IR NEPHROSTOMY EXCHANGE RIGHT  11/28/2017   IR NEPHROSTOMY EXCHANGE RIGHT  01/05/2018   IR NEPHROSTOMY EXCHANGE RIGHT  02/16/2018   IR NEPHROSTOMY EXCHANGE RIGHT  03/30/2018   IR NEPHROSTOMY EXCHANGE RIGHT  05/12/2018   IR NEPHROSTOMY EXCHANGE RIGHT  06/21/2018   IR NEPHROSTOMY EXCHANGE RIGHT  08/04/2018   IR NEPHROSTOMY EXCHANGE RIGHT  09/18/2018   IR NEPHROSTOMY EXCHANGE RIGHT  10/27/2018   IR NEPHROSTOMY EXCHANGE RIGHT  11/21/2018   IR NEPHROSTOMY EXCHANGE RIGHT  01/05/2019   IR NEPHROSTOMY EXCHANGE RIGHT  02/15/2019   IR NEPHROSTOMY EXCHANGE RIGHT  03/29/2019   IR NEPHROSTOMY PLACEMENT LEFT  10/02/2017   IR RADIOLOGIST EVAL & MGMT  05/03/2017   IR US  GUIDE VASC ACCESS LEFT  05/31/2017   IR US  GUIDE VASC ACCESS RIGHT  04/06/2017   IR US  GUIDE VASC ACCESS RIGHT  05/30/2017   LAPAROSCOPIC CHOLECYSTECTOMY  1990   LIPOSUCTION WITH LIPOFILLING Bilateral 04/16/2016   Procedure: LIPOSUCTION WITH LIPOFILLING TO BILATERAL CHEST;  Surgeon: Earlis Ranks, MD;  Location: [facility];  Service: Plastics;  Laterality: Bilateral;   MASTECTOMY W/ SENTINEL NODE BIOPSY Bilateral 12/11/2015   Procedure: RIGHT PROPHYLACTIC MASTECTOMY, LEFT TOTAL MASTECTOMY WITH LEFT AXILLARY SENTINEL LYMPH NODE BIOPSY;  Surgeon: Jina Nephew, MD;  [facility];  Service: General;  Laterality: Bilateral;   OSTOMY N/A 11/19/2014   Procedure: OSTOMY;  Surgeon: Elspeth Schultze, MD;  [facility];  Service: General;  Laterality: N/A;   PROCTOSCOPY N/A 04/01/2017   Procedure: RIDGE PROCTOSCOPY;  Surgeon: Schultze Elspeth, MD;  [facility];   Service: General;  Laterality: N/A;   REMOVAL OF BILATERAL TISSUE EXPANDERS WITH PLACEMENT OF BILATERAL BREAST IMPLANTS Bilateral 04/16/2016   Procedure: REMOVAL OF BILATERAL TISSUE EXPANDERS WITH PLACEMENT OF BILATERAL BREAST IMPLANTS;  Surgeon: Earlis Ranks, MD;  Location: [facility];  Service: Plastics;  Laterality: Bilateral;   ROBOTIC ASSISTED LAP VAGINAL HYSTERECTOMY N/A 11/19/2014   Procedure: ROBOTIC LYSIS OF ADHESIONS, CONVERTED TO LAPAROTOMY RADICAL UPPER VAGINECTOMY,LOW ANTERIOR BOWEL RESECTION, COLOSTOMY, BILATERAL URETERAL STENT PLACEMENT AND CYSTONOMY CLOSURE;  Surgeon: Maurilio Ship, MD;  Location:  [facility];  Service: Gynecology;  Laterality: N/A;   TISSUE EXPANDER FILLING Bilateral 12/26/2015   Procedure: EXPANSION OF BILATERAL CHEST TISSUE EXPANDERS (60 mL- Right; 75 mL- Left);  Surgeon: Earlis Ranks, MD;  Location: [facility];  Service: Plastics;  Laterality: Bilateral;   TONSILLECTOMY     TOTAL ABDOMINAL HYSTERECTOMY  March 2006   [facility]   and Bilateral Salpingoophorectomy/  staging for Ovarian cancer/  an   VULVAR LESION REMOVAL N/A 08/19/2021   Procedure: VULVAR LESION with biopsies;  Surgeon: Viktoria Comer SAUNDERS, MD;  Location: [facility];  Service: Gynecology;  Laterality: N/A;   XI ROBOTIC ASSISTED LOWER ANTERIOR RESECTION N/A 04/01/2017   Procedure: XI ROBOTIC VS LAPAROSCOPIC COLOSTOMY TAKEDOWN WITH LYSIS OF ADHESIONS.;  Surgeon: [name] Standing, MD;  [facility];  Service: General;  Laterality: N/A;  ERAS PATHWAY   Family History  Problem Relation Age of Onset   Cancer Mother 68       stomach ca   Hypertension Mother    Cancer Father 76       prostate ca   Diabetes Father    Heart disease Father        CABG   Hypertension Father    Hyperlipidemia Father    Obesity Father    Breast cancer Maternal Aunt        dx in her 2s   Lymphoma Paternal Aunt    Brain cancer Paternal Grandfather    Ovarian cancer Other     Diabetes Sister    Hypertension Brother    Heart disease Brother        CABG   Diabetes Brother    Social History   Occupational History   Occupation: retired [employer] from [employer]    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  Tobacco Use   Smoking status: Never    Passive exposure: Never   Smokeless tobacco: Never  Vaping Use   Vaping status: Never Used  Substance and Sexual Activity   Alcohol  use: Yes    Comment: Occ   Drug use: No   Sexual activity: Not Currently   Tobacco Counseling Counseling given: Not Answered  SDOH Screenings   Food Insecurity: No Food Insecurity (08/31/2024)  Housing: Low Risk (08/31/2024)  Transportation Needs: No Transportation Needs (08/31/2024)  Utilities: Not At Risk (08/31/2024)  Depression (PHQ2-9): Low Risk (08/31/2024)  Financial Resource Strain: Low Risk (08/21/2024)  Physical Activity: Insufficiently Active (08/31/2024)  Social Connections: Socially Integrated (08/31/2024)  Stress: No Stress Concern Present (08/31/2024)  Tobacco Use: Low Risk (08/31/2024)   See flowsheets for full screening details  Depression Screen PHQ 2 & 9 Depression Scale- Over the past 2 weeks, how often have you been bothered by any of the following problems? Little interest or pleasure in doing things: 0 Feeling down, depressed, or hopeless (PHQ Adolescent also includes...irritable): 0 (feels more frustrated than depressed) PHQ-2 Total Score: 0 Trouble falling or staying asleep, or sleeping too much: 0 Feeling tired or having little energy: 0 (muscle relaxer sometimes makes her sleepy) Poor appetite or overeating (PHQ Adolescent also includes...weight loss): 0 Feeling bad about yourself - or that you are a failure or have let yourself or your family down: 0 Trouble concentrating on things, such as reading the newspaper or watching television (PHQ Adolescent also includes...like school work): 0 Moving or speaking so slowly that other people could have noticed. Or the opposite - being so  fidgety or restless that you have been moving around a  lot more than usual: 0 Thoughts that you would be better off dead, or of hurting yourself in some way: 0 PHQ-9 Total Score: 0 If you checked off any problems, how difficult have these problems made it for you to do your work, take care of things at home, or get along with other people?: Not difficult at all  Depression Treatment Depression Interventions/Treatment : EYV7-0 Score <4 Follow-up Not Indicated     Goals Addressed             This Visit's Progress    To continue eating healthy and losing weight               Objective:    Today's Vitals   08/31/24 1108  Weight: 176 lb (79.8 kg)  Height: 5' 1 (1.549 m)   Body mass index is 33.25 kg/m.  Hearing/Vision screen No results found. Immunizations and Health Maintenance Health Maintenance  Topic Date Due   HEMOGLOBIN A1C  08/25/2024   FOOT EXAM  12/26/2024   COVID-19 Vaccine (8 - Pfizer risk 2025-26 season) 01/31/2025   OPHTHALMOLOGY EXAM  07/19/2025   DTaP/Tdap/Td (5 - Td or Tdap) 08/27/2025   Medicare Annual Wellness (AWV)  08/31/2025   Colonoscopy  11/20/2030   Pneumococcal Vaccine: 50+ Years  Completed   Influenza Vaccine  Completed   Bone Density Scan  Completed   Hepatitis C Screening  Completed   Zoster Vaccines- Shingrix  Completed   Meningococcal B Vaccine  Aged Out   Hepatitis B Vaccines 19-59 Average Risk  Discontinued   Mammogram  Discontinued        Assessment/Plan:  This is a routine wellness examination for [place].  Patient Care Team: Antonio Meth, Jamee SAUNDERS, DO as PCP - General (Family Medicine) Cindie Ole DASEN, MD (Inactive) as PCP - Electrophysiology (Cardiology) Lonn Hicks, MD as Consulting Physician (Hematology and Oncology) [name] Standing, MD as Consulting Physician (General Surgery) Alvaro Ricardo KATHEE Raddle., MD as Consulting Physician (Urology) Charmayne Molly, MD as Consulting Physician (Ophthalmology) Gearline Norris, MD as  Consulting Physician (Nephrology) Verdon Louann BIRCH, MD as Consulting Physician (Family Medicine) Georgie Cella, MD as Referring Physician (Plastic Surgery) Andra Prentice Dunnings, MD as Referring Physician (Urology) Viktoria Comer SAUNDERS, MD as Consulting Physician (Gynecologic Oncology)  I have personally reviewed and noted the following in the patients chart:   Medical and social history Use of alcohol , tobacco or illicit drugs  Current medications and supplements including opioid prescriptions. Functional ability and status Nutritional status Physical activity Advanced directives List of other physicians Hospitalizations, surgeries, and ER visits in previous 12 months Vitals Screenings to include cognitive, depression, and falls Referrals and appointments  Orders Placed This Encounter  Procedures   DG Bone Density    Standing Status:   Future    Expected Date:   08/31/2024    Expiration Date:   08/31/2025    Reason for Exam (SYMPTOM  OR DIAGNOSIS REQUIRED):   postmenopausal estrogen deficiency    Preferred imaging location?:   [address]   In addition, I have reviewed and discussed with patient certain preventive protocols, quality metrics, and best practice recommendations. A written personalized care plan for preventive services as well as general preventive health recommendations were provided to patient.   Lolita Libra, CMA   08/31/2024   Return in 1 year (on 08/31/2025).  After Visit Summary: (MyChart) Due to this being a telephonic visit, the after visit summary with patients personalized plan was offered to patient via MyChart  Nurse Notes: HM Addressed: DEXA ordered  "

## 2024-08-31 NOTE — Telephone Encounter (Signed)
 FYI:  Pt had AWV today. She is currently being treated for Shingles of her left eye. Was recently diagnosed with cervical strain and prescribed prednisone  dose pack. States her neck strain / discomfort is significant and wonders who she should see about that. States eye doctor told her not to take the dose pack as it would suppress her immune response during the shingles course. Advised pt to contact [facility] (who prescribed dose pak) for further recommendation.

## 2024-08-31 NOTE — Patient Instructions (Addendum)
 [name],  Thank you for taking the time for your Medicare Wellness Visit. I appreciate your continued commitment to your health goals. Please review the care plan we discussed, and feel free to reach out if I can assist you further.  Please note that Annual Wellness Visits do not include a physical exam. Some assessments may be limited, especially if the visit was conducted virtually. If needed, we may recommend an in-person follow-up with your provider.  Goal: To continue eating healthy and losing weight   Ongoing Care Seeing your primary care provider every 3 to 6 months helps us  monitor your health and provide consistent, personalized care.   Dr Antonio Meth:  09/24/24 8:20am Medicare AWV:  09/02/25 11am, mychart video visit  Referrals If a referral was made during today's visit and you haven't received any updates within two weeks, please contact the referred provider directly to check on the status. Bone Density ([facility], basement level):  [phone]   Recommended Screenings:  Health Maintenance  Topic Date Due   Medicare Annual Wellness Visit  Never done   Kidney health urinalysis for diabetes  Never done   COVID-19 Vaccine (7 - 2025-26 season) 04/02/2024   Hemoglobin A1C  08/25/2024   Complete foot exam   12/26/2024   Yearly kidney function blood test for diabetes  06/20/2025   Eye exam for diabetics  07/19/2025   DTaP/Tdap/Td vaccine (5 - Td or Tdap) 08/27/2025   Colon Cancer Screening  11/20/2030   Pneumococcal Vaccine for age over 61  Completed   Flu Shot  Completed   Osteoporosis screening with Bone Density Scan  Completed   Hepatitis C Screening  Completed   Zoster (Shingles) Vaccine  Completed   Meningitis B Vaccine  Aged Out   Hepatitis B Vaccine  Discontinued   Breast Cancer Screening  Discontinued       12/27/2023    6:24 PM  Advanced Directives  Does Patient Have a Medical Advance Directive? Yes  Type of Estate Agent of  [place];Living will  Does patient want to make changes to medical advance directive? No - Patient declined  Copy of Healthcare Power of Attorney in Chart? No - copy requested    Vision: Annual vision screenings are recommended for early detection of glaucoma, cataracts, and diabetic retinopathy. These exams can also reveal signs of chronic conditions such as diabetes and high blood pressure.  Dental: Annual dental screenings help detect early signs of oral cancer, gum disease, and other conditions linked to overall health, including heart disease and diabetes.  Please see the attached documents for additional preventive care recommendations.

## 2024-09-02 ENCOUNTER — Other Ambulatory Visit (INDEPENDENT_AMBULATORY_CARE_PROVIDER_SITE_OTHER): Payer: Self-pay | Admitting: Family Medicine

## 2024-09-02 DIAGNOSIS — E1169 Type 2 diabetes mellitus with other specified complication: Secondary | ICD-10-CM

## 2024-09-05 ENCOUNTER — Ambulatory Visit: Admitting: Family Medicine

## 2024-09-05 ENCOUNTER — Encounter: Payer: Self-pay | Admitting: Family Medicine

## 2024-09-05 ENCOUNTER — Ambulatory Visit: Payer: Self-pay

## 2024-09-05 VITALS — BP 118/62 | HR 89 | Temp 97.7°F | Ht 61.0 in | Wt 189.6 lb

## 2024-09-05 DIAGNOSIS — M62838 Other muscle spasm: Secondary | ICD-10-CM

## 2024-09-05 NOTE — Telephone Encounter (Signed)
 Appt scheduled

## 2024-09-05 NOTE — Progress Notes (Signed)
 "  Acute Office Visit  Subjective:  Patient ID: [name], female    DOB: January 25, 1956  Age: 69 y.o. MRN: [id number]  CC:  Chief Complaint  Patient presents with   Neck Pain    Onset - 07/24/24 - fell asleep after a procedure - both sides of neck was painful but now its just the right side - been taking muscle relaxers & Tylenol   Getting over Shingles - diagnosed on 08/03/24 Patient stated she went to [facility] & had an X-ray done but nothing was found      HPI [facility] is here for neck pain.  Discussed the use of AI scribe software for clinical note transcription with the patient, who gave verbal consent to proceed.  History of Present Illness [name] is a 69 year old female who presents with severe neck pain and muscle spasms.  The neck pain began on December 23rd, following a procedure in [place]. She attended a concert that evening and suspects she may have strained her neck. Initially, the pain was diffuse across her neck, but after developing shingles a few days later, it localized to the right side. The pain is described as a spasm that radiates from behind her ear, into her ear, and down her neck, primarily affecting the upper trapezius muscle.  She received a cortisone pack from [facility] the day after the pain started but did not begin the medication. Two days later, she developed shingles in her right eye, confirmed by a retina specialist, which delayed her travel plans to [place]. She was placed on an antiviral, and the shingles began to clear up, allowing her to travel.  She has been taking extra strength Tylenol  every six hours, but it is no longer effective, and she experiences significant pain in the last hour before the next dose. She has not yet started the prednisone  due to concerns about its impact on her shingles recovery, but she called her retina specialist today who gave her clearance to take the steroid taper. She has tried  muscle relaxers, which are part of her back medication regimen.  An x-ray from [facility] showed a curve in her neck but no significant bone issues. Her past medical history includes kidney disease, which restricts her use of NSAIDs, and diabetes, which requires careful monitoring of her blood sugar levels, especially when taking steroids. She has a history of adverse reactions to tramadol  and oxycodone , including a rash and an out-of-body experience, respectively.  Severe neck pain and muscle spasms are localized to the right side, with no recent falls or injuries.        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   oncologist-- dr rossi/ dr gorsuch:/   dx endometroid endometrial & ovarian cancer s/p  chemotheapy and surgery(TAH w/ BSO) :  recurrent 11-19-2014 post pelvic surgery and radiation 01-29-2015 to 03-10-2015   Chronic idiopathic neutropenia    presumed related to chemotherapy March 2006--- followed by dr lonn (treatment w/ G-CSF injections   Chronic nausea    CKD (chronic kidney disease), stage  III [facility])    nephrologist-  dr almarie bonine   Difficult intravenous access    small veins--- hx PICC lines   DM type 2 (diabetes mellitus, type 2) (HCC)    endocriologist--  dr norman sebastian   (08-18-2021  per pt checks blood sugar daily in am,  fasting sugar--92--130)   Dysrhythmia    Environmental and seasonal allergies    Generalized muscle weakness    GERD (gastroesophageal reflux disease)    Hiatal hernia    History of abdominal abscess 04/16/2017   post surgery 04-01-2017   --- resolved 10/ 2018   History of COVID-19 05/2021   per pt mild symptoms that resolved   History of gastric polyp    2014  duodenum   History of ileus 04/16/2017   resolved w/ no surgical intervention   History of radiation therapy    01-29-2015 to 03-10-2015  pelvis 50.4Gy   History of small bowel obstruction 01/2019   Hypothyroidism    monitored by dr ozell altheimer   Ileostomy in place [facility]) 04/01/2017   created at same time colostomy takedown./  permnant 09/ 2020   Mixed dyslipidemia    Multiple thyroid  nodules    Managed by Dr. Eletha   PAF (paroxysmal atrial fibrillation) [facility])    cardiologist--- dr h. 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   Shingles 08/04/2024   left eye   Vitamin D  deficiency    Vulval lesion    Wears glasses     Past Surgical History:  Procedure Laterality Date   APPENDECTOMY     BREAST BIOPSY Left 06/26/2024   US  LT BREAST BX W LOC DEV 1ST LESION IMG BX SPEC US  GUIDE 06/26/2024 [facility]   BREAST BIOPSY Right 06/26/2024   US  RT BREAST BX W LOC DEV 1ST LESION IMG BX SPEC US  GUIDE 06/26/2024 [facility]   BREAST RECONSTRUCTION WITH PLACEMENT OF TISSUE EXPANDER AND FLEX HD (ACELLULAR HYDRATED DERMIS) Bilateral 12/11/2015   Procedure: BILATERAL BREAST RECONSTRUCTION WITH PLACEMENT OF TISSUE EXPANDERS;  Surgeon: Earlis Ranks, MD;  Location: [facility];  Service: Plastics;  Laterality: Bilateral;   COLONOSCOPY WITH PROPOFOL  N/A 08/21/2013   Procedure: COLONOSCOPY WITH PROPOFOL ;  Surgeon: Lamar LULLA Bunk, MD;  Location: [facility];  Service: Endoscopy;  Laterality: N/A;   COLOSTOMY TAKEDOWN N/A  12/04/2014   Procedure: LAPROSCOPIC LYSIS OF ADHESIONS, SPLENIC MOBILIZATION, RELOCATION OF COLOSTOMY, DEBRIDEMENT INITIAL COLOSTOMY SITE;  Surgeon: Elspeth Schultze, MD;  Location: [facility];  Service: General;  Laterality: N/A;   CYSTECTOMY W/ URETEROILEAL CONDUIT  04/12/2019   @[facility] ;   INTESTINAL ANASTOMOSIS, PANNICULECTOMY, PELVIS EXENTENTION,  PARTIAL COLECTOMY REMOVAL ILEIUM WITH PERMNANT ILEOCOLOSTOMY   CYSTOGRAM N/A 06/01/2017   Procedure: CYSTOGRAM;  Surgeon: Alvaro Hummer, MD;  [facility];  Service: Urology;  Laterality: N/A;   CYSTOSCOPY W/ RETROGRADES Right 11/21/2015   Procedure: CYSTOSCOPY WITH RETROGRADE PYELOGRAM;  Surgeon: Hummer Alvaro, MD;  [facility];  Service: Urology;  Laterality: Right;   CYSTOSCOPY W/ URETERAL STENT PLACEMENT Right 11/21/2015   Procedure: CYSTOSCOPY WITH STENT REPLACEMENT;  Surgeon: Hummer Alvaro, MD;  [facility];  Service: Urology;  Laterality: Right;   CYSTOSCOPY W/ URETERAL STENT PLACEMENT Right 03/10/2016   Procedure: CYSTOSCOPY WITH STENT REPLACEMENT;  Surgeon: Ricardo Likens, MD;  Location: [facility];  Service: Urology;  Laterality: Right;   CYSTOSCOPY W/ URETERAL STENT PLACEMENT Right 06/30/2016   Procedure: CYSTOSCOPY WITH RETROGRADE PYELOGRAM/URETERAL STENT EXCHANGE;  Surgeon: Ricardo Likens, MD;  Location: [facility];  Service: Urology;  Laterality: Right;   CYSTOSCOPY W/ URETERAL STENT PLACEMENT N/A 06/01/2017   Procedure: CYSTOSCOPY WITH EXAM UNDER ANESTHESIA;  Surgeon: Likens Ricardo, MD;  [facility];  Service: Urology;  Laterality: N/A;   CYSTOSCOPY W/ URETERAL STENT PLACEMENT Right 08/17/2017   Procedure: CYSTOSCOPY WITH RETROGRADE PYELOGRAM/URETERAL STENT REMOVAL;  Surgeon: Likens Ricardo, MD;  Location: [facility];  Service: Urology;  Laterality: Right;   CYSTOSCOPY WITH RETROGRADE PYELOGRAM, URETEROSCOPY AND STENT PLACEMENT Right 03/20/2015   Procedure: CYSTOSCOPY WITH  RETROGRADE PYELOGRAM, URETEROSCOPY WITH BALLOON DILATION AND STENT PLACEMENT ON RIGHT;  Surgeon: Ricardo Likens, MD;  Location: [facility];  Service: Urology;  Laterality: Right;   CYSTOSCOPY WITH RETROGRADE PYELOGRAM, URETEROSCOPY AND STENT PLACEMENT Right 05/02/2015   Procedure: CYSTOSCOPY WITH RIGHT RETROGRADE PYELOGRAM,  DIAGNOSTIC URETEROSCOPY AND STENT PULL ;  Surgeon: Ricardo Likens, MD;  Location: [facility];  Service: Urology;  Laterality: Right;   CYSTOSCOPY WITH RETROGRADE PYELOGRAM, URETEROSCOPY AND STENT PLACEMENT Right 09/05/2015   Procedure: CYSTOSCOPY WITH RETROGRADE PYELOGRAM,  AND STENT PLACEMENT;  Surgeon: Ricardo Likens, MD;  [facility];  Service: Urology;  Laterality: Right;   CYSTOSCOPY WITH RETROGRADE PYELOGRAM, URETEROSCOPY AND STENT PLACEMENT Right 04/01/2017   Procedure: CYSTOSCOPY WITH RETROGRADE PYELOGRAM, URETEROSCOPY AND STENT PLACEMENT;  Surgeon: Likens Ricardo, MD;  [facility];  Service: Urology;  Laterality: Right;   CYSTOSCOPY WITH STENT PLACEMENT Right 10/27/2016   Procedure: CYSTOSCOPY WITH STENT CHANGE and right retrograde pyelogram;  Surgeon: Ricardo Likens, MD;  Location: [facility];  Service: Urology;  Laterality: Right;   EUS N/A 10/02/2014   Procedure: LOWER ENDOSCOPIC ULTRASOUND (EUS);  Surgeon: Elsie Cree, MD;  Location: [facility];  Service: Endoscopy;  Laterality: N/A;   EXCISION SOFT TISSUE MASS RIGHT FOREMAN  12/08/2006   EYE SURGERY  as child   pytosis of eyelids repair   FOOT SURGERY  10/2023   Left   INCISION AND DRAINAGE OF WOUND Bilateral 12/26/2015   Procedure: DEBRIDEMENT OF BILATERAL MASTECTOMY FLAPS;  Surgeon: Earlis Ranks, MD;  Location: [facility];  Service: Plastics;  Laterality: Bilateral;   IR CV LINE INJECTION  05/31/2017   IR FLUORO GUIDE CV LINE LEFT  05/31/2017   IR FLUORO GUIDE CV LINE RIGHT  04/06/2017   IR FLUORO GUIDE CV MIDLINE PICC RIGHT   05/30/2017   IR NEPHROSTOGRAM LEFT INITIAL PLACEMENT  09/02/2017   IR NEPHROSTOGRAM LEFT THRU EXISTING ACCESS  11/29/2017   IR NEPHROSTOGRAM RIGHT INITIAL PLACEMENT  09/02/2017   IR NEPHROSTOGRAM RIGHT THRU EXISTING ACCESS  09/13/2017   IR NEPHROSTOGRAM RIGHT THRU EXISTING ACCESS  11/29/2017   IR NEPHROSTOMY EXCHANGE LEFT  11/28/2017   IR NEPHROSTOMY EXCHANGE LEFT  01/05/2018   IR NEPHROSTOMY EXCHANGE LEFT  02/16/2018   IR NEPHROSTOMY EXCHANGE LEFT  03/30/2018   IR NEPHROSTOMY EXCHANGE LEFT  05/12/2018   IR NEPHROSTOMY EXCHANGE LEFT  06/21/2018   IR NEPHROSTOMY EXCHANGE LEFT  08/04/2018   IR NEPHROSTOMY EXCHANGE LEFT  09/18/2018   IR NEPHROSTOMY EXCHANGE LEFT  10/09/2018   IR NEPHROSTOMY EXCHANGE  LEFT  10/27/2018   IR NEPHROSTOMY EXCHANGE LEFT  11/21/2018   IR NEPHROSTOMY EXCHANGE LEFT  01/05/2019   IR NEPHROSTOMY EXCHANGE LEFT  02/15/2019   IR NEPHROSTOMY EXCHANGE LEFT  03/29/2019   IR NEPHROSTOMY EXCHANGE RIGHT  10/02/2017   IR NEPHROSTOMY EXCHANGE RIGHT  11/28/2017   IR NEPHROSTOMY EXCHANGE RIGHT  01/05/2018   IR NEPHROSTOMY EXCHANGE RIGHT  02/16/2018   IR NEPHROSTOMY EXCHANGE RIGHT  03/30/2018   IR NEPHROSTOMY EXCHANGE RIGHT  05/12/2018   IR NEPHROSTOMY EXCHANGE RIGHT  06/21/2018   IR NEPHROSTOMY EXCHANGE RIGHT  08/04/2018   IR NEPHROSTOMY EXCHANGE RIGHT  09/18/2018   IR NEPHROSTOMY EXCHANGE RIGHT  10/27/2018   IR NEPHROSTOMY EXCHANGE RIGHT  11/21/2018   IR NEPHROSTOMY EXCHANGE RIGHT  01/05/2019   IR NEPHROSTOMY EXCHANGE RIGHT  02/15/2019   IR NEPHROSTOMY EXCHANGE RIGHT  03/29/2019   IR NEPHROSTOMY PLACEMENT LEFT  10/02/2017   IR RADIOLOGIST EVAL & MGMT  05/03/2017   IR US  GUIDE VASC ACCESS LEFT  05/31/2017   IR US  GUIDE VASC ACCESS RIGHT  04/06/2017   IR US  GUIDE VASC ACCESS RIGHT  05/30/2017   LAPAROSCOPIC CHOLECYSTECTOMY  1990   LIPOSUCTION WITH LIPOFILLING Bilateral 04/16/2016   Procedure: LIPOSUCTION WITH LIPOFILLING TO BILATERAL CHEST;  Surgeon: Earlis Ranks,  MD;  Location: [facility];  Service: Plastics;  Laterality: Bilateral;   MASTECTOMY W/ SENTINEL NODE BIOPSY Bilateral 12/11/2015   Procedure: RIGHT PROPHYLACTIC MASTECTOMY, LEFT TOTAL MASTECTOMY WITH LEFT AXILLARY SENTINEL LYMPH NODE BIOPSY;  Surgeon: Jina Nephew, MD;  [facility];  Service: General;  Laterality: Bilateral;   OSTOMY N/A 11/19/2014   Procedure: OSTOMY;  Surgeon: Elspeth Schultze, MD;  [facility];  Service: General;  Laterality: N/A;   PROCTOSCOPY N/A 04/01/2017   Procedure: RIDGE PROCTOSCOPY;  Surgeon: Schultze Elspeth, MD;  [facility];  Service: General;  Laterality: N/A;   REMOVAL OF BILATERAL TISSUE EXPANDERS WITH PLACEMENT OF BILATERAL BREAST IMPLANTS Bilateral 04/16/2016   Procedure: REMOVAL OF BILATERAL TISSUE EXPANDERS WITH PLACEMENT OF BILATERAL BREAST IMPLANTS;  Surgeon: Earlis Ranks, MD;  Location: [facility];  Service: Plastics;  Laterality: Bilateral;   ROBOTIC ASSISTED LAP VAGINAL HYSTERECTOMY N/A 11/19/2014   Procedure: ROBOTIC LYSIS OF ADHESIONS, CONVERTED TO LAPAROTOMY RADICAL UPPER VAGINECTOMY,LOW ANTERIOR BOWEL RESECTION, COLOSTOMY, BILATERAL URETERAL STENT PLACEMENT AND CYSTONOMY CLOSURE;  Surgeon: Maurilio Ship, MD;  [facility];  Service: Gynecology;  Laterality: N/A;   TISSUE EXPANDER FILLING Bilateral 12/26/2015   Procedure: EXPANSION OF BILATERAL CHEST TISSUE EXPANDERS (60 mL- Right; 75 mL- Left);  Surgeon: Earlis Ranks, MD;  Location: [facility];  Service: Plastics;  Laterality: Bilateral;   TONSILLECTOMY     TOTAL ABDOMINAL HYSTERECTOMY  March 2006   [facility]   and Bilateral Salpingoophorectomy/  staging for Ovarian cancer/  an   VULVAR LESION REMOVAL N/A 08/19/2021   Procedure: VULVAR LESION with biopsies;  Surgeon: Viktoria Comer SAUNDERS, MD;  Location: [facility];  Service: Gynecology;  Laterality: N/A;   XI ROBOTIC ASSISTED LOWER ANTERIOR RESECTION N/A 04/01/2017   Procedure:  XI ROBOTIC VS LAPAROSCOPIC COLOSTOMY TAKEDOWN WITH LYSIS OF ADHESIONS.;  Surgeon: Schultze Elspeth, MD;  [facility];  Service: General;  Laterality: N/A;  ERAS PATHWAY    Family History  Problem Relation Age of Onset   Cancer Mother 32       stomach ca   Hypertension Mother    Cancer Father 72       prostate ca  Diabetes Father    Heart disease Father        CABG   Hypertension Father    Hyperlipidemia Father    Obesity Father    Breast cancer Maternal Aunt        dx in her 64s   Lymphoma Paternal Aunt    Brain cancer Paternal Grandfather    Ovarian cancer Other    Diabetes Sister    Hypertension Brother    Heart disease Brother        CABG   Diabetes Brother     Social History   Socioeconomic History   Marital status: Married    Spouse name: Clinical Research Associate   Number of children: 1   Years of education: Not on file   Highest education level: Master's degree (e.g., MA, MS, MEng, MEd, MSW, MBA)  Occupational History   Occupation: retired [employer] from [employer]    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  Tobacco Use   Smoking status: Never    Passive exposure: Never   Smokeless tobacco: Never  Vaping Use   Vaping status: Never Used  Substance and Sexual Activity   Alcohol  use: Yes    Comment: Occ   Drug use: No   Sexual activity: Not Currently  Other Topics Concern   Not on file  Social History Narrative   Exercise-- has not gotten back into it since cancer came back   Social Drivers of Health   Tobacco Use: Low Risk (09/05/2024)   Patient History    Smoking Tobacco Use: Never    Smokeless Tobacco Use: Never    Passive Exposure: Never  Financial Resource Strain: Low Risk (08/21/2024)   Overall Financial Resource Strain (CARDIA)    Difficulty of Paying Living Expenses: Not hard at all  Food Insecurity: No Food Insecurity (08/31/2024)   Epic    Worried About Programme Researcher, Broadcasting/film/video in the Last Year: Never true    Ran Out of Food in the Last Year: Never true  Transportation Needs: No  Transportation Needs (08/31/2024)   Epic    Lack of Transportation (Medical): No    Lack of Transportation (Non-Medical): No  Physical Activity: Insufficiently Active (08/31/2024)   Exercise Vital Sign    Days of Exercise per Week: 1 day    Minutes of Exercise per Session: 30 min  Stress: No Stress Concern Present (08/31/2024)   [employer] of Occupational Health - Occupational Stress Questionnaire    Feeling of Stress: Not at all  Social Connections: Socially Integrated (08/31/2024)   Social Connection and Isolation Panel    Frequency of Communication with Friends and Family: More than three times a week    Frequency of Social Gatherings with Friends and Family: More than three times a week    Attends Religious Services: More than 4 times per year    Active Member of [employer] or Organizations: Yes    Attends Banker Meetings: More than 4 times per year    Marital Status: Married  Catering Manager Violence: Not At Risk (08/31/2024)   Epic    Fear of Current or Ex-Partner: No    Emotionally Abused: No    Physically Abused: No    Sexually Abused: No  Depression (PHQ2-9): Low Risk (08/31/2024)   Depression (PHQ2-9)    PHQ-2 Score: 0  Alcohol  Screen: Not on file  Housing: Low Risk (08/31/2024)   Epic    Unable to Pay for Housing in the Last Year: No  Number of Times Moved in the Last Year: 0    Homeless in the Last Year: No  Utilities: Not At Risk (08/31/2024)   Epic    Threatened with loss of utilities: No  Health Literacy: Not on file    ROS All ROS negative except what is listed in the HPI.   Objective:   Today's Vitals: BP 118/62 (BP Location: Right Arm, Patient Position: Sitting, Cuff Size: Normal)   Pulse 89   Temp 97.7 F (36.5 C) (Oral)   Ht 5' 1 (1.549 m)   Wt 189 lb 9.6 oz (86 kg)   SpO2 98%   BMI 35.82 kg/m   Physical Exam Vitals reviewed.  Constitutional:      Appearance: Normal appearance.  HENT:     Right Ear: Tympanic membrane normal.   Neck:     Comments: Right upper trap tight/tender on palpation, ROM impaired due to pain; no skin changes  Skin:    General: Skin is warm and dry.     Findings: No rash.  Neurological:     Mental Status: She is alert and oriented to person, place, and time.  Psychiatric:        Mood and Affect: Mood normal.        Behavior: Behavior normal.        Thought Content: Thought content normal.        Judgment: Judgment normal.         Assessment & Plan:   Problem List Items Addressed This Visit   None Visit Diagnoses       Neck muscle spasm    -  Primary   Relevant Orders   Ambulatory referral to Physical Therapy       Assessment & Plan Neck muscle spasm Severe discomfort in right upper trapezius. Muscle relaxers provided only short-term relief. Prednisone  taper approved for inflammation and spasms. Tylenol  insufficient. - Initiate prednisone  taper - prescribed by Ortho but not yet started (retina specialist gave clearance to start now). - Apply heat 4-5 times daily. - Perform gentle stretching exercises - handout provided. - Continue muscle relaxers PRN - Consider Voltaren gel. - Refer to physical therapy. - Consider orthopedic referral for trigger point injections or further evaluation if no improvement. Patient aware of signs/symptoms requiring further/urgent evaluation.        Follow-up: Return if symptoms worsen or fail to improve.   Waddell FURY Almarie, DNP, FNP-C  I,Emily Lagle,acting as a neurosurgeon for Waddell KATHEE Almarie, NP.,have documented all relevant documentation on the behalf of Waddell KATHEE Almarie, NP.   I, Waddell KATHEE Almarie, NP, have reviewed all documentation for this visit. The documentation on 09/05/2024 for the exam, diagnosis, procedures, and orders are all accurate and complete.  "

## 2024-09-05 NOTE — Telephone Encounter (Signed)
 FYI Only or Action Required?: FYI only for provider: appointment scheduled on 09/04/24.  Patient was last seen in primary care on 08/30/2024 by Verdon Louann BIRCH, MD.  Called Nurse Triage reporting Herpes Zoster and Neck Pain.  Symptoms began about a month ago.  Interventions attempted: OTC medications: ES Tylenol .  Symptoms are: gradually worsening.  Triage Disposition: See HCP Within 4 Hours (Or PCP Triage)  Patient/caregiver understands and will follow disposition?: Yes  Message from [facility] sent at 09/05/2024 10:09 AM EST  Reason for Triage: patient is having severe nerves in her right back side of her head and her neck, was diagnosed with shingles in her right eye on 1/2, stated she's in severe pain can't sleep hurts to touch her neck   Reason for Disposition  [1] SEVERE neck pain (e.g., excruciating, unable to do any normal activities) AND [2] not improved after 2 hours of pain medicine  Answer Assessment - Initial Assessment Questions Patient called in severe radiating right-sided neck pain. Patient with hx of shingles, denies rash.   1. ONSET: When did the pain begin?      Last month - 08/03/24, later diagnosed with Shingles  2. LOCATION: Where does it hurt?      Right side of neck  3. PATTERN Does the pain come and go, or has it been constant since it started?      Constant  4. SEVERITY: How bad is the pain?  (Scale 0-10; or none or slight stiffness, mild, moderate, severe)     8/10  5. RADIATION: Does the pain go anywhere else, shoot into your arms?     Radiates up the back of skull and around the ear on right side  6. CORD SYMPTOMS: Any weakness or numbness of the arms or legs?     Denies  7. CAUSE: What do you think is causing the neck pain?     Unsure, I think it's nerve related  8. NECK OVERUSE: Any recent activities that involved turning or twisting the neck?     Moving the neck makes the pain increase  9. OTHER SYMPTOMS: Do you have  any other symptoms? (e.g., headache, fever, chest pain, difficulty breathing, neck swelling)     Denies fever but has cough- residual from recent flu; mouth blisters/ulcers; right eye visual changes  Protocols used: Neck Pain or Stiffness-A-AH

## 2024-09-24 ENCOUNTER — Ambulatory Visit: Admitting: Family Medicine

## 2024-10-09 ENCOUNTER — Inpatient Hospital Stay

## 2024-10-09 ENCOUNTER — Inpatient Hospital Stay: Admitting: Hematology and Oncology

## 2024-10-12 ENCOUNTER — Ambulatory Visit (HOSPITAL_BASED_OUTPATIENT_CLINIC_OR_DEPARTMENT_OTHER): Admitting: Physical Therapy

## 2024-11-23 ENCOUNTER — Inpatient Hospital Stay: Admitting: Gynecologic Oncology

## 2024-11-29 ENCOUNTER — Ambulatory Visit (INDEPENDENT_AMBULATORY_CARE_PROVIDER_SITE_OTHER): Admitting: Family Medicine

## 2025-09-02 ENCOUNTER — Ambulatory Visit
